# Patient Record
Sex: Female | Born: 1940
Health system: Southern US, Community
[De-identification: ages and names within clinical notes are randomized; demographics above are authoritative.]

## PROBLEM LIST (undated history)

## (undated) ENCOUNTER — Emergency Department (HOSPITAL_COMMUNITY): Payer: Medicare HMO

## (undated) DIAGNOSIS — I2510 Atherosclerotic heart disease of native coronary artery without angina pectoris: Secondary | ICD-10-CM

## (undated) DIAGNOSIS — I472 Ventricular tachycardia: Secondary | ICD-10-CM

## (undated) DIAGNOSIS — F419 Anxiety disorder, unspecified: Secondary | ICD-10-CM

## (undated) DIAGNOSIS — I059 Rheumatic mitral valve disease, unspecified: Secondary | ICD-10-CM

## (undated) DIAGNOSIS — I219 Acute myocardial infarction, unspecified: Secondary | ICD-10-CM

## (undated) DIAGNOSIS — I4729 Other ventricular tachycardia: Secondary | ICD-10-CM

## (undated) DIAGNOSIS — I340 Nonrheumatic mitral (valve) insufficiency: Secondary | ICD-10-CM

## (undated) DIAGNOSIS — E7800 Pure hypercholesterolemia, unspecified: Secondary | ICD-10-CM

## (undated) DIAGNOSIS — K279 Peptic ulcer, site unspecified, unspecified as acute or chronic, without hemorrhage or perforation: Secondary | ICD-10-CM

## (undated) DIAGNOSIS — I5032 Chronic diastolic (congestive) heart failure: Secondary | ICD-10-CM

## (undated) DIAGNOSIS — M1990 Unspecified osteoarthritis, unspecified site: Secondary | ICD-10-CM

## (undated) DIAGNOSIS — K5520 Angiodysplasia of colon without hemorrhage: Secondary | ICD-10-CM

## (undated) DIAGNOSIS — C189 Malignant neoplasm of colon, unspecified: Secondary | ICD-10-CM

## (undated) DIAGNOSIS — I480 Paroxysmal atrial fibrillation: Secondary | ICD-10-CM

## (undated) DIAGNOSIS — J9621 Acute and chronic respiratory failure with hypoxia: Secondary | ICD-10-CM

## (undated) DIAGNOSIS — D509 Iron deficiency anemia, unspecified: Secondary | ICD-10-CM

## (undated) DIAGNOSIS — J189 Pneumonia, unspecified organism: Secondary | ICD-10-CM

## (undated) DIAGNOSIS — K219 Gastro-esophageal reflux disease without esophagitis: Secondary | ICD-10-CM

## (undated) DIAGNOSIS — Z8719 Personal history of other diseases of the digestive system: Secondary | ICD-10-CM

## (undated) DIAGNOSIS — Z992 Dependence on renal dialysis: Secondary | ICD-10-CM

## (undated) DIAGNOSIS — C569 Malignant neoplasm of unspecified ovary: Secondary | ICD-10-CM

## (undated) DIAGNOSIS — I10 Essential (primary) hypertension: Secondary | ICD-10-CM

## (undated) DIAGNOSIS — I739 Peripheral vascular disease, unspecified: Secondary | ICD-10-CM

## (undated) DIAGNOSIS — I779 Disorder of arteries and arterioles, unspecified: Secondary | ICD-10-CM

## (undated) DIAGNOSIS — J42 Unspecified chronic bronchitis: Secondary | ICD-10-CM

## (undated) DIAGNOSIS — N186 End stage renal disease: Secondary | ICD-10-CM

## (undated) DIAGNOSIS — I079 Rheumatic tricuspid valve disease, unspecified: Secondary | ICD-10-CM

## (undated) DIAGNOSIS — K222 Esophageal obstruction: Secondary | ICD-10-CM

## (undated) DIAGNOSIS — I5022 Chronic systolic (congestive) heart failure: Secondary | ICD-10-CM

## (undated) DIAGNOSIS — I82C11 Acute embolism and thrombosis of right internal jugular vein: Secondary | ICD-10-CM

## (undated) DIAGNOSIS — G459 Transient cerebral ischemic attack, unspecified: Secondary | ICD-10-CM

## (undated) DIAGNOSIS — D649 Anemia, unspecified: Secondary | ICD-10-CM

## (undated) DIAGNOSIS — Z8739 Personal history of other diseases of the musculoskeletal system and connective tissue: Secondary | ICD-10-CM

## (undated) DIAGNOSIS — Z9289 Personal history of other medical treatment: Secondary | ICD-10-CM

## (undated) HISTORY — DX: Other ventricular tachycardia: I47.29

## (undated) HISTORY — PX: COLON SURGERY: SHX602

## (undated) HISTORY — PX: DILATION AND CURETTAGE OF UTERUS: SHX78

## (undated) HISTORY — PX: TUBAL LIGATION: SHX77

## (undated) HISTORY — DX: Nonrheumatic mitral (valve) insufficiency: I34.0

## (undated) HISTORY — DX: Rheumatic mitral valve disease, unspecified: I05.9

## (undated) HISTORY — DX: Ventricular tachycardia: I47.2

## (undated) HISTORY — DX: Esophageal obstruction: K22.2

## (undated) HISTORY — DX: Paroxysmal atrial fibrillation: I48.0

## (undated) HISTORY — DX: Angiodysplasia of colon without hemorrhage: K55.20

## (undated) HISTORY — PX: OVARY SURGERY: SHX727

## (undated) HISTORY — DX: Anemia, unspecified: D64.9

## (undated) HISTORY — DX: Peripheral vascular disease, unspecified: I73.9

## (undated) HISTORY — PX: CORONARY ANGIOPLASTY WITH STENT PLACEMENT: SHX49

## (undated) HISTORY — DX: Rheumatic tricuspid valve disease, unspecified: I07.9

## (undated) HISTORY — DX: Disorder of arteries and arterioles, unspecified: I77.9

## (undated) HISTORY — DX: Chronic systolic (congestive) heart failure: I50.22

---

## 1990-06-10 HISTORY — PX: APPENDECTOMY: SHX54

## 1990-07-11 DIAGNOSIS — C189 Malignant neoplasm of colon, unspecified: Secondary | ICD-10-CM

## 1990-07-11 DIAGNOSIS — C569 Malignant neoplasm of unspecified ovary: Secondary | ICD-10-CM

## 1990-07-11 HISTORY — PX: ABDOMINAL HYSTERECTOMY: SHX81

## 1990-07-11 HISTORY — DX: Malignant neoplasm of unspecified ovary: C56.9

## 1990-07-11 HISTORY — DX: Malignant neoplasm of colon, unspecified: C18.9

## 1990-07-11 HISTORY — PX: COLON RESECTION: SHX5231

## 2005-12-19 ENCOUNTER — Ambulatory Visit: Payer: Self-pay | Admitting: Cardiology

## 2006-07-12 ENCOUNTER — Ambulatory Visit: Payer: Self-pay | Admitting: Cardiology

## 2009-05-30 ENCOUNTER — Ambulatory Visit: Payer: Self-pay | Admitting: Cardiology

## 2009-07-11 HISTORY — PX: THROMBECTOMY / ARTERIOVENOUS GRAFT REVISION: SUR1351

## 2009-07-11 HISTORY — PX: AV FISTULA PLACEMENT: SHX1204

## 2011-03-28 DIAGNOSIS — R079 Chest pain, unspecified: Secondary | ICD-10-CM

## 2011-03-29 DIAGNOSIS — R079 Chest pain, unspecified: Secondary | ICD-10-CM

## 2011-04-29 DIAGNOSIS — N186 End stage renal disease: Secondary | ICD-10-CM

## 2011-04-29 DIAGNOSIS — I120 Hypertensive chronic kidney disease with stage 5 chronic kidney disease or end stage renal disease: Secondary | ICD-10-CM | POA: Insufficient documentation

## 2011-04-29 DIAGNOSIS — D631 Anemia in chronic kidney disease: Secondary | ICD-10-CM | POA: Diagnosis present

## 2011-04-29 DIAGNOSIS — N2581 Secondary hyperparathyroidism of renal origin: Secondary | ICD-10-CM | POA: Diagnosis present

## 2011-04-29 DIAGNOSIS — M109 Gout, unspecified: Secondary | ICD-10-CM | POA: Insufficient documentation

## 2011-05-04 DIAGNOSIS — F419 Anxiety disorder, unspecified: Secondary | ICD-10-CM | POA: Insufficient documentation

## 2011-12-10 DIAGNOSIS — E7800 Pure hypercholesterolemia, unspecified: Secondary | ICD-10-CM

## 2011-12-10 HISTORY — DX: Pure hypercholesterolemia, unspecified: E78.00

## 2011-12-12 DIAGNOSIS — R079 Chest pain, unspecified: Secondary | ICD-10-CM

## 2011-12-15 ENCOUNTER — Ambulatory Visit (HOSPITAL_COMMUNITY): Admit: 2011-12-15 | Payer: Self-pay | Admitting: Cardiovascular Disease

## 2011-12-15 ENCOUNTER — Inpatient Hospital Stay (HOSPITAL_COMMUNITY)
Admission: AD | Admit: 2011-12-15 | Discharge: 2011-12-16 | DRG: 246 | Disposition: A | Discharge status: Home / Self Care | Payer: Medicare Other | Source: Other Acute Inpatient Hospital | Attending: Cardiology | Admitting: Cardiology

## 2011-12-15 ENCOUNTER — Encounter (HOSPITAL_COMMUNITY)
Admission: AD | Disposition: A | Discharge status: Home / Self Care | Payer: Self-pay | Source: Other Acute Inpatient Hospital | Attending: Cardiology

## 2011-12-15 ENCOUNTER — Other Ambulatory Visit: Payer: Self-pay | Admitting: Physician Assistant

## 2011-12-15 ENCOUNTER — Encounter (HOSPITAL_COMMUNITY): Payer: Self-pay | Admitting: General Practice

## 2011-12-15 ENCOUNTER — Other Ambulatory Visit: Payer: Self-pay | Admitting: Cardiology

## 2011-12-15 DIAGNOSIS — Z992 Dependence on renal dialysis: Secondary | ICD-10-CM

## 2011-12-15 DIAGNOSIS — M109 Gout, unspecified: Secondary | ICD-10-CM | POA: Diagnosis present

## 2011-12-15 DIAGNOSIS — Z85038 Personal history of other malignant neoplasm of large intestine: Secondary | ICD-10-CM

## 2011-12-15 DIAGNOSIS — K219 Gastro-esophageal reflux disease without esophagitis: Secondary | ICD-10-CM | POA: Diagnosis present

## 2011-12-15 DIAGNOSIS — N039 Chronic nephritic syndrome with unspecified morphologic changes: Secondary | ICD-10-CM | POA: Diagnosis present

## 2011-12-15 DIAGNOSIS — I2510 Atherosclerotic heart disease of native coronary artery without angina pectoris: Principal | ICD-10-CM

## 2011-12-15 DIAGNOSIS — Z95820 Peripheral vascular angioplasty status with implants and grafts: Secondary | ICD-10-CM

## 2011-12-15 DIAGNOSIS — Z91041 Radiographic dye allergy status: Secondary | ICD-10-CM

## 2011-12-15 DIAGNOSIS — I10 Essential (primary) hypertension: Secondary | ICD-10-CM

## 2011-12-15 DIAGNOSIS — N186 End stage renal disease: Secondary | ICD-10-CM | POA: Diagnosis present

## 2011-12-15 DIAGNOSIS — I120 Hypertensive chronic kidney disease with stage 5 chronic kidney disease or end stage renal disease: Secondary | ICD-10-CM | POA: Diagnosis present

## 2011-12-15 DIAGNOSIS — Z881 Allergy status to other antibiotic agents status: Secondary | ICD-10-CM

## 2011-12-15 DIAGNOSIS — D631 Anemia in chronic kidney disease: Secondary | ICD-10-CM | POA: Diagnosis present

## 2011-12-15 DIAGNOSIS — Z79899 Other long term (current) drug therapy: Secondary | ICD-10-CM

## 2011-12-15 DIAGNOSIS — R079 Chest pain, unspecified: Secondary | ICD-10-CM

## 2011-12-15 DIAGNOSIS — Z8543 Personal history of malignant neoplasm of ovary: Secondary | ICD-10-CM

## 2011-12-15 DIAGNOSIS — E785 Hyperlipidemia, unspecified: Secondary | ICD-10-CM

## 2011-12-15 DIAGNOSIS — M19079 Primary osteoarthritis, unspecified ankle and foot: Secondary | ICD-10-CM | POA: Diagnosis present

## 2011-12-15 DIAGNOSIS — M19049 Primary osteoarthritis, unspecified hand: Secondary | ICD-10-CM | POA: Diagnosis present

## 2011-12-15 DIAGNOSIS — J40 Bronchitis, not specified as acute or chronic: Secondary | ICD-10-CM

## 2011-12-15 DIAGNOSIS — J42 Unspecified chronic bronchitis: Secondary | ICD-10-CM | POA: Diagnosis present

## 2011-12-15 DIAGNOSIS — Z886 Allergy status to analgesic agent status: Secondary | ICD-10-CM

## 2011-12-15 DIAGNOSIS — Z880 Allergy status to penicillin: Secondary | ICD-10-CM

## 2011-12-15 DIAGNOSIS — Z7902 Long term (current) use of antithrombotics/antiplatelets: Secondary | ICD-10-CM

## 2011-12-15 DIAGNOSIS — Z7982 Long term (current) use of aspirin: Secondary | ICD-10-CM

## 2011-12-15 DIAGNOSIS — F411 Generalized anxiety disorder: Secondary | ICD-10-CM | POA: Diagnosis present

## 2011-12-15 DIAGNOSIS — Z882 Allergy status to sulfonamides status: Secondary | ICD-10-CM

## 2011-12-15 DIAGNOSIS — I200 Unstable angina: Secondary | ICD-10-CM

## 2011-12-15 HISTORY — DX: Pneumonia, unspecified organism: J18.9

## 2011-12-15 HISTORY — DX: Atherosclerotic heart disease of native coronary artery without angina pectoris: I25.10

## 2011-12-15 HISTORY — DX: Gastro-esophageal reflux disease without esophagitis: K21.9

## 2011-12-15 HISTORY — DX: Essential (primary) hypertension: I10

## 2011-12-15 HISTORY — DX: Unspecified osteoarthritis, unspecified site: M19.90

## 2011-12-15 HISTORY — PX: LEFT HEART CATHETERIZATION WITH CORONARY ANGIOGRAM: SHX5451

## 2011-12-15 HISTORY — DX: Personal history of other diseases of the digestive system: Z87.19

## 2011-12-15 HISTORY — DX: Malignant neoplasm of unspecified ovary: C56.9

## 2011-12-15 HISTORY — PX: CORONARY ANGIOPLASTY WITH STENT PLACEMENT: SHX49

## 2011-12-15 HISTORY — DX: Personal history of other diseases of the musculoskeletal system and connective tissue: Z87.39

## 2011-12-15 HISTORY — DX: Personal history of other medical treatment: Z92.89

## 2011-12-15 HISTORY — DX: Anxiety disorder, unspecified: F41.9

## 2011-12-15 HISTORY — DX: Malignant neoplasm of colon, unspecified: C18.9

## 2011-12-15 HISTORY — DX: Unspecified chronic bronchitis: J42

## 2011-12-15 LAB — CBC
HCT: 36.8 % (ref 36.0–46.0)
MCHC: 31.8 g/dL (ref 30.0–36.0)
MCV: 93.4 fL (ref 78.0–100.0)
RDW: 17.1 % — ABNORMAL HIGH (ref 11.5–15.5)

## 2011-12-15 LAB — CREATININE, SERUM: GFR calc Af Amer: 6 mL/min — ABNORMAL LOW (ref >90)

## 2011-12-15 LAB — POCT ACTIVATED CLOTTING TIME: Activated Clotting Time: 325 seconds

## 2011-12-15 SURGERY — LEFT HEART CATHETERIZATION WITH CORONARY ANGIOGRAM
Anesthesia: LOCAL

## 2011-12-15 MED ORDER — NITROGLYCERIN 0.4 MG SL SUBL: 0.4000 mg | SUBLINGUAL_TABLET | SUBLINGUAL | Status: DC | PRN

## 2011-12-15 MED ORDER — MIDAZOLAM HCL 2 MG/2ML IJ SOLN
INTRAMUSCULAR | Status: AC
  Filled 2011-12-15: qty 2

## 2011-12-15 MED ORDER — NITROGLYCERIN 0.2 MG/ML ON CALL CATH LAB
INTRAVENOUS | Status: AC
  Filled 2011-12-15: qty 1

## 2011-12-15 MED ORDER — BIVALIRUDIN 250 MG IV SOLR
INTRAVENOUS | Status: AC
  Filled 2011-12-15: qty 250

## 2011-12-15 MED ORDER — ASPIRIN 81 MG PO CHEW: 324.0000 mg | CHEWABLE_TABLET | ORAL | Status: DC

## 2011-12-15 MED ORDER — LANTHANUM CARBONATE 500 MG PO CHEW
1000.0000 mg | CHEWABLE_TABLET | Freq: Three times a day (TID) | ORAL | Status: DC
  Administered 2011-12-16 (×3): 1000 mg via ORAL
  Filled 2011-12-15 (×7): qty 2

## 2011-12-15 MED ORDER — SODIUM CHLORIDE 0.9 % IV SOLN: 250.0000 mL | INTRAVENOUS | Status: DC | PRN

## 2011-12-15 MED ORDER — HYDRALAZINE HCL 10 MG PO TABS
10.0000 mg | ORAL_TABLET | Freq: Four times a day (QID) | ORAL | Status: DC | PRN
  Filled 2011-12-15: qty 1

## 2011-12-15 MED ORDER — FAMOTIDINE 20 MG PO TABS
20.0000 mg | ORAL_TABLET | Freq: Once | ORAL | Status: DC
  Filled 2011-12-15: qty 1

## 2011-12-15 MED ORDER — ENOXAPARIN SODIUM 30 MG/0.3ML ~~LOC~~ SOLN
30.0000 mg | SUBCUTANEOUS | Status: DC
  Filled 2011-12-15: qty 0.3

## 2011-12-15 MED ORDER — ONDANSETRON HCL 4 MG/2ML IJ SOLN: 4.0000 mg | Freq: Four times a day (QID) | INTRAMUSCULAR | Status: DC | PRN

## 2011-12-15 MED ORDER — CLONIDINE HCL ER 0.1 MG PO TB12
0.2000 mg | ORAL_TABLET | Freq: Two times a day (BID) | ORAL | Status: DC
  Filled 2011-12-15 (×4): qty 2

## 2011-12-15 MED ORDER — ALPRAZOLAM 0.25 MG PO TABS
0.2500 mg | ORAL_TABLET | Freq: Every day | ORAL | Status: DC
  Administered 2011-12-15: 0.25 mg via ORAL
  Filled 2011-12-15 (×2): qty 1

## 2011-12-15 MED ORDER — NITROGLYCERIN 2 % TD OINT
1.0000 [in_us] | TOPICAL_OINTMENT | Freq: Four times a day (QID) | TRANSDERMAL | Status: DC
  Filled 2011-12-15: qty 30

## 2011-12-15 MED ORDER — HEPARIN (PORCINE) IN NACL 2-0.9 UNIT/ML-% IJ SOLN
INTRAMUSCULAR | Status: AC
  Filled 2011-12-15: qty 2000

## 2011-12-15 MED ORDER — CEFUROXIME AXETIL 250 MG PO TABS
250.0000 mg | ORAL_TABLET | Freq: Two times a day (BID) | ORAL | Status: DC
  Administered 2011-12-16 (×2): 250 mg via ORAL
  Filled 2011-12-15 (×3): qty 1

## 2011-12-15 MED ORDER — ACETAMINOPHEN 325 MG PO TABS: 650.0000 mg | ORAL_TABLET | ORAL | Status: DC | PRN

## 2011-12-15 MED ORDER — DIPHENHYDRAMINE HCL 25 MG PO CAPS: 25.0000 mg | ORAL_CAPSULE | Freq: Once | ORAL | Status: DC

## 2011-12-15 MED ORDER — PANTOPRAZOLE SODIUM 40 MG PO TBEC: 40.0000 mg | DELAYED_RELEASE_TABLET | Freq: Every day | ORAL | Status: DC

## 2011-12-15 MED ORDER — ATORVASTATIN CALCIUM 20 MG PO TABS
20.0000 mg | ORAL_TABLET | Freq: Every day | ORAL | Status: DC
  Administered 2011-12-15: 20 mg via ORAL
  Filled 2011-12-15 (×3): qty 1

## 2011-12-15 MED ORDER — SODIUM CHLORIDE 0.9 % IJ SOLN
3.0000 mL | Freq: Two times a day (BID) | INTRAMUSCULAR | Status: DC
  Administered 2011-12-15: 3 mL via INTRAVENOUS

## 2011-12-15 MED ORDER — DIAZEPAM 5 MG PO TABS: 5.0000 mg | ORAL_TABLET | ORAL | Status: DC

## 2011-12-15 MED ORDER — ASPIRIN EC 81 MG PO TBEC
81.0000 mg | DELAYED_RELEASE_TABLET | Freq: Every day | ORAL | Status: DC
  Administered 2011-12-16: 81 mg via ORAL
  Filled 2011-12-15: qty 1

## 2011-12-15 MED ORDER — LIDOCAINE HCL (PF) 1 % IJ SOLN
INTRAMUSCULAR | Status: AC
  Filled 2011-12-15: qty 30

## 2011-12-15 MED ORDER — SODIUM CHLORIDE 0.9 % IJ SOLN: 3.0000 mL | INTRAMUSCULAR | Status: DC | PRN

## 2011-12-15 MED ORDER — DIAZEPAM 5 MG PO TABS
ORAL_TABLET | ORAL | Status: AC
  Filled 2011-12-15: qty 1

## 2011-12-15 MED ORDER — CLOPIDOGREL BISULFATE 75 MG PO TABS
ORAL_TABLET | ORAL | Status: AC
  Filled 2011-12-15: qty 6

## 2011-12-15 MED ORDER — SODIUM CHLORIDE 0.9 % IJ SOLN: 3.0000 mL | Freq: Two times a day (BID) | INTRAMUSCULAR | Status: DC

## 2011-12-15 MED ORDER — FENTANYL CITRATE 0.05 MG/ML IJ SOLN
INTRAMUSCULAR | Status: AC
  Filled 2011-12-15: qty 2

## 2011-12-15 MED ORDER — METHYLPREDNISOLONE SODIUM SUCC 125 MG IJ SOLR: 125.0000 mg | Freq: Every day | INTRAMUSCULAR | Status: DC

## 2011-12-15 MED ORDER — METHYLPREDNISOLONE SODIUM SUCC 125 MG IJ SOLR
INTRAMUSCULAR | Status: AC
  Filled 2011-12-15: qty 2

## 2011-12-15 MED ORDER — ASPIRIN 81 MG PO CHEW
CHEWABLE_TABLET | ORAL | Status: AC
  Filled 2011-12-15: qty 4

## 2011-12-15 MED ORDER — ASPIRIN 300 MG RE SUPP: 300.0000 mg | RECTAL | Status: DC

## 2011-12-15 MED ORDER — PREDNISONE 50 MG PO TABS: 60.0000 mg | ORAL_TABLET | Freq: Once | ORAL | Status: DC

## 2011-12-15 MED ORDER — AMLODIPINE BESYLATE 10 MG PO TABS
10.0000 mg | ORAL_TABLET | Freq: Every day | ORAL | Status: DC
  Administered 2011-12-16: 10 mg via ORAL
  Filled 2011-12-15 (×2): qty 1

## 2011-12-15 MED ORDER — CLOPIDOGREL BISULFATE 75 MG PO TABS
75.0000 mg | ORAL_TABLET | Freq: Every day | ORAL | Status: DC
  Administered 2011-12-16: 75 mg via ORAL
  Filled 2011-12-15: qty 1

## 2011-12-15 MED ORDER — LABETALOL HCL 200 MG PO TABS
400.0000 mg | ORAL_TABLET | Freq: Two times a day (BID) | ORAL | Status: DC
  Administered 2011-12-15 – 2011-12-16 (×2): 400 mg via ORAL
  Filled 2011-12-15 (×4): qty 2

## 2011-12-15 NOTE — Progress Notes (Signed)
UR Completed Hien Perreira Graves-Bigelow, RN,BSN [phone]  

## 2011-12-15 NOTE — Interval H&P Note (Signed)
History and Physical Interval Note:  12/15/2011 3:17 PM  Shawna [name]  has presented today for surgery, with the diagnosis of Chest pain  The various methods of treatment have been discussed with the patient and family. After consideration of risks, benefits and other options for treatment, the patient has consented to  Procedure(s) (LRB): LEFT HEART CATHETERIZATION WITH CORONARY ANGIOGRAM (N/A) as a surgical intervention .  The patients' history has been reviewed, patient examined, no change in status, stable for surgery.  I have reviewed the patients' chart and labs.  Questions were answered to the patient's satisfaction.     Torrell Krutz

## 2011-12-15 NOTE — Care Management Note (Unsigned)
    Page 1 of 1   12/15/2011     4:02:02 PM   CARE MANAGEMENT NOTE 12/15/2011  Patient:  Shawna [name], Shawna [name]   Account Number:  [id number]  Date Initiated:  12/15/2011  Documentation initiated by:  GRAVES-BIGELOW,Carlene Bickley  Subjective/Objective Assessment:   Pt admitted with + stress test and planned for cath.     Action/Plan:   CM will continue to monitor post cath for disposiiton needs.   Anticipated DC Date:  12/17/2011   Anticipated DC Plan:  [place]  CM consult      Choice offered to / List presented to:             Status of service:  In process, will continue to follow Medicare Important Message given?   (If response is "NO", the following Medicare IM given date fields will be blank) Date Medicare IM given:   Date Additional Medicare IM given:    Discharge Disposition:    Per UR Regulation:  Reviewed for med. necessity/level of care/duration of stay  If discussed at [place] of Stay Meetings, dates discussed:    Comments:

## 2011-12-15 NOTE — CV Procedure (Signed)
Cardiac Catheterization Operative Report  Shylin Venezia [id number] 6/6/20133:40 PM [name], MD, MD  Procedure Performed:  1. Left Heart Catheterization 2. Selective Coronary Angiography 3. Left ventricular angiogram 4. PTCA/DES x 1 proximal Circumflex 5. PTCA/DES x 1 Diagonal 1  Operator: Lauree Chandler, MD  Indication: Pt with history of HTN, ESRD on HD who has had recent chest pain. Stress echo with possible ischemia. Pt transferred here today by Dr. Dannielle Burn for cardiac cath. She reports itching in past with IV dye used for MRI and "test on kidneys". She has received 60 mg po Prednisone this am and 125 mg IV Solumedrol this afternoon. Pt given 324 mg ASA on the cath table before the case started.                                    Procedure Details: The risks, benefits, complications, treatment options, and expected outcomes were discussed with the patient. The patient and/or family concurred with the proposed plan, giving informed consent. The patient was brought to the cath lab after IV hydration was begun and oral premedication was given. The patient was further sedated with Versed and Fentanyl. The right groin was prepped and draped in the usual manner. Using the modified Seldinger access technique, a 5 French sheath was placed in the right femoral artery. Standard diagnostic catheters were used to perform selective coronary angiography. A pigtail catheter was used to perform a left ventricular angiogram.  She was found to have a severe stenosis in the Circumflex and a severe stenosis in the Diagonal branch. I elected to proceed to intervention of both vessels. She was given a bolus of Angiomax and a drip was started. When the ACT was >200, I engaged the left main with a XB LAD 3.5 guiding catheter. I had considerable difficulty passing a wire down the AV groove Circumflex. This was complicated by the acute takeoff and also a large intermediate branch that the wire  favored. I was ultimately able to pass a Whisper wire down the Circumflex. I then used a 2.0 x 12 mm balloon to predilate the stenosis. I then deployed a 2.25 x 14 mm Resolute Integrity DES in the proximal Circumflex. This was post-dilated with a 2.25 x 8 mm Prospect balloon x 2. There was an excellent result. The stenosis was taken from 90% down to 0%.  I then turned my atttention to the Diagonal lesion. A Cougar wire was passed down the LAD into the Diagonal branch. I then used a 2.0 x 12 mm balloon to pre-dilate the stenosis in the Diagonal. A 2.25 x 22 mm Resolute Integrity DES was deployed in the Diagonal branch. I then post-dilated the stent with a 2.25 x 12 mm balloon in the distal segment. I then used a 2.25 x 6 mm [place] balloon to post-dilate the proximal and mid segments of the stent. There was an excellent angiographic result. The stenosis was taken from 99% down to 0%.   There were no immediate complications. She was given 600 mg Plavix po x 1 at the conclusion of the case since she could not adequately swallow the pills while lying flat on the table. The patient was taken to the recovery area in stable condition.   Hemodynamic Findings: Central aortic pressure: 159/75 Left ventricular pressure: 171/915  Angiographic Findings:  Left main:  Mild plaque.   Left Anterior Descending Artery: Large caliber vessel that  courses to the apex. The proximal vessel has diffuse 30% stenosis. The mid vessel has a 40% stenosis just after the moderate sized first diagonal branch. The diagonal branch is a small to moderate sized vessel (2.42mm) with 99% stenosis in the mid portion. The distal LAD has a smooth 50% stenosis.   Circumflex Artery: Small to moderate sized vessel with 90% stenosis proximally just after a small to moderate sized OM branch. There is a moderate sized intermediate branch with proximal 20% stenosis.   Right Coronary Artery: Large dominant vessel with mild diffuse plaque throughout.   Left  Ventricular Angiogram: LVEF 55%. No significant MR noted.   Impression: 1. Triple vessel CAD with severe stenosis in the Circumflex and Diagonal branches with moderate LAD disease.  2. Successful PTCA/DES x 1 in the Circumflex artery 3. Successful PTCA/DES x 1 in the Diagonal artery 4. Preserved LV systolic function  Recommendations: She will need ASA and Plavix for at least one year. She reports issues with ASA which do not sound like a true allergy. Will discuss further in am. Will start a statin.        Complications:  None. The patient tolerated the procedure well.

## 2011-12-15 NOTE — H&P (View-Only) (Signed)
NAME:  Shawna [name], Shawna [name] NIBLETT ROOM: [place]:  [id number] LOCATION: 49F 304 01 ADM/VISIT DATE:  12/11/2011   ADM PHYSCaryl Bis:  [id number] DOB: 11/05/1940   SUBJECTIVE:  The patient is doing well.  She denies any chest pain or shortness of breath.  Her coughing has improved also.  She had her stress test yesterday, which had to be redone because she initially could not achieve her 85% of maximum predicted heart rate.  OBJECTIVE:  Vital signs:  Blood pressure 152/80, heart rate 73 beats per minute, respirations 16, temperature is 98, saturation 96% on room air.  General:  A well-nourished and very pleasant African American female in no distress.  HEENT:  Within normal limits.  Neck:  Normal carotid upstroke.  No carotid bruits.  Lungs: Clear breath sounds bilaterally.  Heart:  Regular rate and rhythm.  Normal S1, S2.  No pathological murmurs.  Extremities:  There is no peripheral pitting edema.  LABORATORY:  Glucose 11The right ventricle is mildly dilated. 2, BUN 37, creatinine 6.55, calcium 9, sodium 139, potassium 4.6, CO2 30.  Cardiac enzymes previously were mildly elevated at 0.08, 0.08, 0.26 and 0.03 respectively in reverse order.  IMAGING:  A stress echocardiogram:  Abnormal ST segment depression of 1 mm node, leads 2, 3, AVF and V4 through V6 at peak.  No arrhythmias.  Positive electrocardiographic stress test.  There was also positive echocardiographic stress portion with hypokinesis of the lateral wall at peak heart rate.  This study was interpreted by Dr. Domenic Polite.  ASSESSMENT: 1. Atypical chest pain. a. Borderline cardiac enzymes. b. Negative electrocardiogram for ischemia. c. Normal Lexiscan in 03/2011. d. Abnormal dobutamine stress echocardiogram with lateral hypokinesis and ST segment depression in leads 2, 3, AVF and V4 through V6. 2. Dyspnea on exertion. a. Normal left ventricular systolic function. b. Abnormal diastolic function, consistent with impaired relaxation.  The left  ventricular diastolic filling pattern is consistent with both elevated mean left atrial pressure, left ventricle and diastolic pressure. 3. Mitral regurgitation by echocardiogram, not audible on exam. 4. Hypertension, under improved control. 5. End-stage renal disease, on hemodialysis. 6. IVP dye allergy.  Dye allergy is characterized by itching and rash, but no anaphylactic reaction.  PLAN: 1. The patient has agreed to proceed with cardiac catheterization, after we explained to her the results of the stress echocardiogram.  Although her chest pain is atypical, she has multiple cardiac risk factors and she would benefit from a diagnostic cardiac catheterization. 2. The patient is IVP dye allergic, characterized by rash and itching.  No anaphylaxis, however, and no problems breathing.  Unfortunately the patient was not pretreated last night with prednisone, H2 blockers, and H1 blockers.  I have given her the medications stat this morning, and we will repeat that in 6 hours.  I do think, given that her allergy is only a rash, she can likely proceed with catheterization later today after she receives her second dose of prednisone. 3. I discussed the risks and benefits of there cardiac catheterization with the patient, and she is willing to proceed, and I will notify [facility] and we will have the patient transfer to [facility] as soon as possible.   __________________________    Terald Sleeper, M.D. Dairl Ponder D: 12/15/2011 [id number] T: 12/15/2011 0917 P: DEG  Al Decant Gent 12/15/2011 1:08 PM

## 2011-12-15 NOTE — H&P (Signed)
NAME:  Shawna [name], Shawna [name] ROOM: [place]:  [id number] LOCATION: 62F 304 01 ADM/VISIT DATE:  12/11/2011   ADM PHYSCaryl Bis:  [id number] DOB: [birth year]   SUBJECTIVE:  The patient is doing well.  She denies any chest pain or shortness of breath.  Her coughing has improved also.  She had her stress test yesterday, which had to be redone because she initially could not achieve her 85% of maximum predicted heart rate.  OBJECTIVE:  Vital signs:  Blood pressure 152/80, heart rate 73 beats per minute, respirations 16, temperature is 98, saturation 96% on room air.  General:  A well-nourished and very pleasant African American female in no distress.  HEENT:  Within normal limits.  Neck:  Normal carotid upstroke.  No carotid bruits.  Lungs: Clear breath sounds bilaterally.  Heart:  Regular rate and rhythm.  Normal S1, S2.  No pathological murmurs.  Extremities:  There is no peripheral pitting edema.  LABORATORY:  Glucose 11The right ventricle is mildly dilated. 2, BUN 37, creatinine 6.55, calcium 9, sodium 139, potassium 4.6, CO2 30.  Cardiac enzymes previously were mildly elevated at 0.08, 0.08, 0.26 and 0.03 respectively in reverse order.  IMAGING:  A stress echocardiogram:  Abnormal ST segment depression of 1 mm node, leads 2, 3, AVF and V4 through V6 at peak.  No arrhythmias.  Positive electrocardiographic stress test.  There was also positive echocardiographic stress portion with hypokinesis of the lateral wall at peak heart rate.  This study was interpreted by Dr. Domenic Polite.  ASSESSMENT: 1. Atypical chest pain. a. Borderline cardiac enzymes. b. Negative electrocardiogram for ischemia. c. Normal Lexiscan in 03/2011. d. Abnormal dobutamine stress echocardiogram with lateral hypokinesis and ST segment depression in leads 2, 3, AVF and V4 through V6. 2. Dyspnea on exertion. a. Normal left ventricular systolic function. b. Abnormal diastolic function, consistent with impaired relaxation.  The left  ventricular diastolic filling pattern is consistent with both elevated mean left atrial pressure, left ventricle and diastolic pressure. 3. Mitral regurgitation by echocardiogram, not audible on exam. 4. Hypertension, under improved control. 5. End-stage renal disease, on hemodialysis. 6. IVP dye allergy.  Dye allergy is characterized by itching and rash, but no anaphylactic reaction.  PLAN: 1. The patient has agreed to proceed with cardiac catheterization, after we explained to her the results of the stress echocardiogram.  Although her chest pain is atypical, she has multiple cardiac risk factors and she would benefit from a diagnostic cardiac catheterization. 2. The patient is IVP dye allergic, characterized by rash and itching.  No anaphylaxis, however, and no problems breathing.  Unfortunately the patient was not pretreated last night with prednisone, H2 blockers, and H1 blockers.  I have given her the medications stat this morning, and we will repeat that in 6 hours.  I do think, given that her allergy is only a rash, she can likely proceed with catheterization later today after she receives her second dose of prednisone. 3. I discussed the risks and benefits of there cardiac catheterization with the patient, and she is willing to proceed, and I will notify [facility] and we will have the patient transfer to [facility] as soon as possible.   __________________________    Terald Sleeper, M.D. Dairl Ponder D: 12/15/2011 [id number] T: 12/15/2011 0917 P: DEG  Al Decant Gent 12/15/2011 1:08 PM

## 2011-12-16 ENCOUNTER — Inpatient Hospital Stay (HOSPITAL_COMMUNITY): Payer: Medicare Other

## 2011-12-16 ENCOUNTER — Encounter (HOSPITAL_COMMUNITY): Payer: Self-pay | Admitting: Nurse Practitioner

## 2011-12-16 DIAGNOSIS — J40 Bronchitis, not specified as acute or chronic: Secondary | ICD-10-CM

## 2011-12-16 DIAGNOSIS — Z992 Dependence on renal dialysis: Secondary | ICD-10-CM

## 2011-12-16 DIAGNOSIS — N186 End stage renal disease: Secondary | ICD-10-CM

## 2011-12-16 DIAGNOSIS — I200 Unstable angina: Secondary | ICD-10-CM

## 2011-12-16 DIAGNOSIS — I2510 Atherosclerotic heart disease of native coronary artery without angina pectoris: Secondary | ICD-10-CM

## 2011-12-16 DIAGNOSIS — I10 Essential (primary) hypertension: Secondary | ICD-10-CM

## 2011-12-16 DIAGNOSIS — E785 Hyperlipidemia, unspecified: Secondary | ICD-10-CM

## 2011-12-16 LAB — BASIC METABOLIC PANEL
Calcium: 9.2 mg/dL (ref 8.4–10.5)
Creatinine, Ser: 8.88 mg/dL — ABNORMAL HIGH (ref 0.50–1.10)
GFR calc Af Amer: 5 mL/min — ABNORMAL LOW (ref >90)
GFR calc non Af Amer: 4 mL/min — ABNORMAL LOW (ref >90)

## 2011-12-16 LAB — LIPID PANEL
LDL Cholesterol: 114 mg/dL — ABNORMAL HIGH (ref 0–99)
Total CHOL/HDL Ratio: 2.5 RATIO

## 2011-12-16 LAB — CBC
Platelets: 247 10*3/uL (ref 150–400)
RDW: 17.3 % — ABNORMAL HIGH (ref 11.5–15.5)
WBC: 6.9 10*3/uL (ref 4.0–10.5)

## 2011-12-16 MED ORDER — PANTOPRAZOLE SODIUM 40 MG PO TBEC: 40.0000 mg | DELAYED_RELEASE_TABLET | Freq: Every day | ORAL | Status: DC

## 2011-12-16 MED ORDER — PARICALCITOL 5 MCG/ML IV SOLN
INTRAVENOUS | Status: AC
  Filled 2011-12-16: qty 1

## 2011-12-16 MED ORDER — NITROGLYCERIN 0.4 MG SL SUBL: 0.4000 mg | SUBLINGUAL_TABLET | SUBLINGUAL | Status: DC | PRN

## 2011-12-16 MED ORDER — DOXERCALCIFEROL 4 MCG/2ML IV SOLN
INTRAVENOUS | Status: AC
  Administered 2011-12-16: 3.5 ug via INTRAVENOUS
  Filled 2011-12-16: qty 2

## 2011-12-16 MED ORDER — LABETALOL HCL 200 MG PO TABS: 400.0000 mg | ORAL_TABLET | Freq: Two times a day (BID) | ORAL | Status: DC

## 2011-12-16 MED ORDER — ASPIRIN 81 MG PO TBEC: 81.0000 mg | DELAYED_RELEASE_TABLET | Freq: Every day | ORAL | Status: DC

## 2011-12-16 MED ORDER — CLOPIDOGREL BISULFATE 75 MG PO TABS: 75.0000 mg | ORAL_TABLET | Freq: Every day | ORAL | Status: DC

## 2011-12-16 MED ORDER — DOXERCALCIFEROL 4 MCG/2ML IV SOLN
3.5000 ug | Freq: Once | INTRAVENOUS | Status: AC
  Administered 2011-12-16: 3.5 ug via INTRAVENOUS
  Filled 2011-12-16: qty 2

## 2011-12-16 MED ORDER — ATORVASTATIN CALCIUM 20 MG PO TABS: 20.0000 mg | ORAL_TABLET | Freq: Every day | ORAL | Status: DC

## 2011-12-16 MED ORDER — DARBEPOETIN ALFA-POLYSORBATE 25 MCG/0.42ML IJ SOLN
INTRAMUSCULAR | Status: AC
  Filled 2011-12-16: qty 0.42

## 2011-12-16 MED ORDER — HEPARIN SODIUM (PORCINE) 1000 UNIT/ML DIALYSIS
20.0000 [IU]/kg | INTRAMUSCULAR | Status: DC | PRN
  Administered 2011-12-16: 1300 [IU] via INTRAVENOUS_CENTRAL
  Filled 2011-12-16: qty 2

## 2011-12-16 MED ORDER — PENTAFLUOROPROP-TETRAFLUOROETH EX AERO
INHALATION_SPRAY | CUTANEOUS | Status: AC
  Administered 2011-12-16: 1
  Filled 2011-12-16: qty 103.5

## 2011-12-16 MED ORDER — DARBEPOETIN ALFA-POLYSORBATE 25 MCG/0.42ML IJ SOLN
25.0000 ug | INTRAMUSCULAR | Status: DC
  Administered 2011-12-16: 25 ug via INTRAVENOUS
  Filled 2011-12-16: qty 0.42

## 2011-12-16 MED FILL — Dextrose Inj 5%: INTRAVENOUS | Qty: 50 | Status: AC

## 2011-12-16 NOTE — Discharge Instructions (Signed)
***PLEASE REMEMBER TO BRING ALL OF YOUR MEDICATIONS TO EACH OF YOUR FOLLOW-UP OFFICE VISITS.  Groin Site Care Refer to this sheet in the next few weeks. These instructions provide you with information on caring for yourself after your procedure. Your caregiver may also give you more specific instructions. Your treatment has been planned according to current medical practices, but problems sometimes occur. Call your caregiver if you have any problems or questions after your procedure. HOME CARE INSTRUCTIONS  You may shower 24 hours after the procedure. Remove the bandage (dressing) and gently wash the site with plain soap and water. Gently pat the site dry.   Do not apply powder or lotion to the site.   Do not sit in a bathtub, swimming pool, or whirlpool for 5 to 7 days.   No bending, squatting, or lifting anything over 10 pounds (4.5 kg) as directed by your caregiver.   Inspect the site at least twice daily.   Do not drive home if you are discharged the same day of the procedure. Have someone else drive you.   You may drive 24 hours after the procedure unless otherwise instructed by your caregiver.  What to expect:  Any bruising will usually fade within 1 to 2 weeks.   Blood that collects in the tissue (hematoma) may be painful to the touch. It should usually decrease in size and tenderness within 1 to 2 weeks.  SEEK IMMEDIATE MEDICAL CARE IF:  You have unusual pain at the groin site or down the affected leg.   You have redness, warmth, swelling, or pain at the groin site.   You have drainage (other than a small amount of blood on the dressing).   You have chills.   You have a fever or persistent symptoms for more than 72 hours.   You have a fever and your symptoms suddenly get worse.   Your leg becomes pale, cool, tingly, or numb.  You have heavy bleeding from the site. Hold pressure on the site. . Coronary Angiography with Stent This is a procedure to widen or open a  narrow blood vessel of the heart (coronary artery). When a coronary artery becomes partially blocked it decreases blood flow to that area. This may lead to chest pain or a heart attack (myocardial infarction). Arteries may become blocked by cholesterol buildup (plaque) in the lining or wall. A stent is a small piece of metal that looks like a mesh or a spring. Stent placement may be done right after an angiogram that finds a blocked artery or as a treatment for a heart attack. RISKS AND COMPLICATIONS Damage to the heart.  A blockage may return.  Bleeding at the site.  Blood clot to another part of the body.  PROCEDURE You may be given a medication to help you relax before and during the procedure through an IV in your hand or arm.  A local anesthetic to make the area numb may be used before inserting the catheter (a long, hollow tube about the size of a piece of cooked spaghetti).  You will be prepared for the procedure by washing and shaving the area where the catheter will be inserted. This is usually done in the groin.  A specially trained doctor will insert the catheter with a guide wire into an artery. This is guided under a special type of X-ray (fluoroscopy) to the opening of the blocked artery.  Special dye is then injected and X-rays are taken.  A tiny wire is  guided to the blocked spot and a balloon is inflated to make the artery wider. The stent is expanded and crushes the plaque into the wall of the vessel. The stent holds the area open like a scaffolding and improves the blood flow.  Sometimes the artery may be made wider using a laser or other tools to remove plaque.  When the blood flow is better, the catheter is removed. The lining of the artery will grow over the stent which stays where it was placed.  AFTER THE PROCEDURE You will stay in bed for several hours.  The access site will be watched and you will be checked frequently.  Blood tests, X-rays and an EKG may be done.  You  may stay in the hospital overnight for observation.  SEEK IMMEDIATE MEDICAL CARE IF:  You develop chest pain, shortness of breath, feel faint, or pass out.  There is bleeding, swelling, or drainage from the catheter insertion site.  You develop pain, discoloration, coldness, or severe bruising in the leg or arm that held the catheter.  You see blood in your urine or stool. This may be bright red blood in urine or stools, or also appear as black, tarry stools.  You have a fever.  Document Released: 01/01/2003 Document Revised: 06/16/2011 Document Reviewed: 08/24/2007 [facility] Patient Information 2012 [place].

## 2011-12-16 NOTE — Discharge Summary (Signed)
See full note this am. cdm 

## 2011-12-16 NOTE — Plan of Care (Signed)
Problem: Phase II Progression Outcomes Goal: Pain controlled Outcome: Completed/Met Date Met:  12/15/11 painfree Goal: OOB to chair per PCI oders Outcome: Completed/Met Date Met:  12/15/11 OOB to bathroom and sitting up in bed. Tolerating well.  Goal: Vascular site scale level 0 - I Vascular Site Scale Level 0: No bruising/bleeding/hematoma Level I (Mild): Bruising/Ecchymosis, minimal bleeding/ooozing, palpable hematoma < 3 cm Level II (Moderate): Bleeding not affecting hemodynamic parameters, pseudoaneurysm, palpable hematoma > 3 cm Outcome: Completed/Met Date Met:  12/15/11 Right groin level 0 Goal: No post PCI angina Outcome: Completed/Met Date Met:  12/15/11 No post PCI angina reported Goal: Distal pulses equal baseline assessment Outcome: Completed/Met Date Met:  12/16/11 Pulses palpable radial and pedal bilaterally Goal: Discharge plan established Outcome: Completed/Met Date Met:  12/16/11 Planned discharge tomorrow after dialysis Goal: Tolerating diet Outcome: Completed/Met Date Met:  12/16/11 Tolerated dinner and snack tonight without problems

## 2011-12-16 NOTE — Discharge Summary (Signed)
Patient ID: Shawna [name],  MRN: [id number], DOB/AGE: [birth year] 71 y.o.  Admit date: 12/15/2011 Discharge date: 12/16/2011  Primary Care Provider: [name], MD Primary Cardiologist: Darnell Level. [name] MD  Discharge Diagnoses Principal Problem:  *Unstable angina  Active Problems:  CAD (coronary artery disease)  **s/p PCI/DES to the LCX and Diagonal this admission.  ESRD (end stage renal disease)  HTN (hypertension)  Hyperlipidemia  Bronchitis  Allergies Allergies  Allergen Reactions  . Aspirin Other (See Comments)    Mess up her stomach; "makes my bowels have blood in them"  . Contrast Media (Iodinated Diagnostic Agents) Itching  . Macrodantin (Nitrofurantoin Macrocrystal) Other (See Comments)    "broke me out in big old knots all over my body; had to go to ER"  . Penicillins Other (See Comments)    "makes me real weak when I take it; like I'll pass out"  . Bactrim (Sulfamethoxazole W-Trimethoprim) Rash  . Sulfa Antibiotics Rash   Procedures  Cardiac Catheterization and Percutaneous Coronary Intervention 12/15/2011  Hemodynamic Findings: Central aortic pressure: 159/75 Left ventricular pressure: 171/915  Angiographic Findings:  Left main:  Mild plaque.   Left Anterior Descending Artery: Large caliber vessel that courses to the apex. The proximal vessel has diffuse 30% stenosis. The mid vessel has a 40% stenosis just after the moderate sized first diagonal branch. The diagonal branch is a small to moderate sized vessel (2.65mm) with 99% stenosis in the mid portion. The distal LAD has a smooth 50% stenosis.    **The Diagonal was stented with a 2.25 x 109mm Resolute Integrity DES**  Circumflex Artery: Small to moderate sized vessel with 90% stenosis proximally just after a small to moderate sized OM branch. There is a moderate sized intermediate branch with proximal 20% stenosis.    **The LCX was stented with a 2.25 x 89mm Resolute Integrity DES** Right Coronary Artery:  Large dominant vessel with mild diffuse plaque throughout.   Left Ventricular Angiogram: LVEF 55%. No significant MR noted.  _____________  History of Present Illness  71 y/o female without prior cardiac history.  She was in her USOH until earlier this week when she developed chest pain and dyspnea prompting her to present to [facility] for further evaluation.  There, she had mild elevation in troponin (to a peak of 0.26) in the setting of known ESRD.  She was seen by cardiology and a dobutamine echo was performed and showed lateral hypokinesis with ST segment depression in leads 2, 3, aVF, & V4-V6.  Given symptoms and abnormal stress test, decision was made to transfer her to [facility] for further evaluation.  Because of a contrast allergy, she was pre-treated with routine contrast allergy prophylaxis.  Hospital Course  Following admission to [facility], pt was taken to the [facility] cath lab where she underwent diagnostic catheterization.  This revealed severe left circumflex and first diagonal disease with normal LV function.  Attention was turned to the LCX and Diagonal and both areas were successfully stented with Resolute Integrity drug-eluting stents.  She tolerated this procedure well and post-procedure has had no further chest pain.  She has been seen by nephrology and her M, W, F, dialysis schedule has been maintained.  She will be discharged home today following dialysis.  Discharge Vitals Blood pressure 177/72, pulse 75, temperature 97.8 F (36.6 C), temperature source Oral, resp. rate 18, height 5\' 1"  (1.549 m), weight 143 lb 15.4 oz (65.3 kg), SpO2 98.00%.  Filed Weights   12/15/11 1400 12/16/11 0048  12/16/11 1230  Weight: 139 lb 1.6 oz (63.095 kg) 142 lb 3.2 oz (64.5 kg) 143 lb 15.4 oz (65.3 kg)   Labs  CBC  Basename 12/16/11 0400 12/15/11 1513  WBC 6.9 5.9  NEUTROABS -- --  HGB 10.8* 11.7*  HCT 33.1* 36.8  MCV 92.7 93.4  PLT 247 [id number]   Basic Metabolic Panel  Basename  12/16/11 0400 12/15/11 1513  NA 130* --  K 5.1 --  CL 88* --  CO2 22 --  GLUCOSE 169* --  BUN 61* --  CREATININE 8.88* 7.18*  CALCIUM 9.2 --  MG -- --  PHOS -- --   Fasting Lipid Panel  Basename 12/16/11 0400  CHOL 210*  HDL 83  LDLCALC 114*  TRIG 64  CHOLHDL 2.5  LDLDIRECT --   Disposition  Pt is being discharged home today in good condition.  Follow-up Plans & Appointments  Follow-up Information    Follow up with Aurora Mask, PA on 01/05/2012. (2:00 PM)    Contact information:   [place], [place] [phone]       Follow up with [facility], MD. Jerilynn Mages, W, F dialysis)    Contact information:   [place] [phone]       Follow up with [name], MD. (as scheduled)    Contact information:   [place] [phone]        Discharge Medications  Medication List  As of 12/16/2011  3:17 PM   STOP taking these medications         guaiFENesin 600 MG 12 hr tablet      omeprazole 20 MG capsule         TAKE these medications         ALPRAZolam 0.25 MG tablet   Commonly known as: XANAX   Take 0.25-0.5 mg by mouth at bedtime.      amLODipine 10 MG tablet   Commonly known as: NORVASC   Take 10 mg by mouth daily.      aspirin 81 MG EC tablet   Take 1 tablet (81 mg total) by mouth daily.      atorvastatin 20 MG tablet   Commonly known as: LIPITOR   Take 1 tablet (20 mg total) by mouth daily at 6 PM.      cloNIDine 0.2 MG tablet   Commonly known as: CATAPRES   Take 0.2 mg by mouth 2 (two) times daily.      clopidogrel 75 MG tablet   Commonly known as: PLAVIX   Take 1 tablet (75 mg total) by mouth daily with breakfast.      folic acid-vitamin b complex-vitamin c-selenium-zinc 3 MG Tabs   Take 1 tablet by mouth daily.      iron polysaccharides 150 MG capsule   Commonly known as: NIFEREX   Take 150 mg by mouth 2 (two) times  daily.      labetalol 200 MG tablet   Commonly known as: NORMODYNE   Take 2 tablets (400 mg total) by mouth 2 (two) times daily.      lanthanum 500 MG chewable tablet   Commonly known as: FOSRENOL   Chew 1,000 mg by mouth 3 (three) times daily with meals.      loratadine 10 MG tablet   Commonly known as: CLARITIN   Take 10 mg by mouth daily as needed. For allergies.  nitroGLYCERIN 0.4 MG SL tablet   Commonly known as: NITROSTAT   Place 1 tablet (0.4 mg total) under the tongue every 5 (five) minutes x 3 doses as needed for chest pain.      pantoprazole 40 MG tablet   Commonly known as: PROTONIX   Take 1 tablet (40 mg total) by mouth daily.          Outstanding Labs/Studies  Follow-up lipids/lft's in 6-8 wks.  Duration of Discharge Encounter   Greater than 30 minutes including physician time.  Signed, Murray Hodgkins NP 12/16/2011, 3:17 PM

## 2011-12-16 NOTE — Progress Notes (Addendum)
CARDIAC REHAB PHASE I   PRE:  Rate/Rhythm: 83 SR  BP:  Supine:   Sitting: 171/65  Standing:    SaO2:   MODE:  Ambulation: 680 ft   POST:  Rate/Rhythem: 94 SR  BP:  Supine:   Sitting: 202/84 recheck 173/73  Standing:    SaO2:  [id number] Assisted X 1 to ambulate. Gait steady BP 202/84 after walk. Pt without c/o with walking, no cp or SOB. Completed discharge education with pt and husband. Pt agrees to [place]. CRP in [place], will send referral. Rechecked BP after pt rested from walk 173/73. Reported BP to RN.  Deon Pilling

## 2011-12-16 NOTE — Progress Notes (Signed)
Patient Name: Shawna [name] [name] Date of Encounter: 12/16/2011   Principal Problem:  *Unstable angina Active Problems:  CAD (coronary artery disease)  ESRD (end stage renal disease)  HTN (hypertension)  Hyperlipidemia  Bronchitis    SUBJECTIVE  No chest pain or sob overnight.  For dialysis today.  CURRENT MEDS    . ALPRAZolam  0.25 mg Oral QHS  . amLODipine  10 mg Oral Daily  . aspirin EC  81 mg Oral Daily  . atorvastatin  20 mg Oral q1800  . bivalirudin      . cefUROXime  250 mg Oral BID WC  . cloNIDine HCl  0.2 mg Oral BID  . clopidogrel  75 mg Oral Q breakfast  . diphenhydrAMINE  25 mg Oral Once  . famotidine  20 mg Oral Once  . fentaNYL      . heparin      . labetalol  400 mg Oral BID  . lanthanum  1,000 mg Oral TID WC  . lidocaine      . midazolam      . midazolam      . nitroGLYCERIN      . sodium chloride  3 mL Intravenous Q12H    OBJECTIVE  Filed Vitals:   12/15/11 2116 12/15/11 2200 12/16/11 0048 12/16/11 0413  BP: 162/75 172/66 169/87 164/71  Pulse: 82  79 82  Temp: 97.5 F (36.4 C)  97.4 F (36.3 C) 98.1 F (36.7 C)  TempSrc: Oral  Oral Oral  Resp: 20 19 20 26   Height:      Weight:   142 lb 3.2 oz (64.5 kg)   SpO2: 95%  96% 95%    Intake/Output Summary (Last 24 hours) at 12/16/11 0710 Last data filed at 12/15/11 2239  Gross per 24 hour  Intake      0 ml  Output    150 ml  Net   -150 ml   Filed Weights   12/15/11 1400 12/16/11 0048  Weight: 139 lb 1.6 oz (63.095 kg) 142 lb 3.2 oz (64.5 kg)    PHYSICAL EXAM  General: Pleasant, NAD. Neuro: Alert and oriented X 3. Moves all extremities spontaneously. Psych: Normal affect. HEENT:  Normal  Neck: Supple without bruits or JVD. Lungs:  Resp regular and unlabored, CTA. Heart: RRR no s3, s4, or murmurs. Abdomen: Soft, non-tender, non-distended, BS + x 4.  Extremities: No clubbing, cyanosis or edema. DP/PT/Radials 1+ and equal bilaterally.  R groin w/o bleeding/bruit/hematoma.   Left arm avf w/ + bruit/thrill.  Accessory Clinical Findings  CBC  Basename 12/16/11 0400 12/15/11 1513  WBC 6.9 5.9  NEUTROABS -- --  HGB 10.8* 11.7*  HCT 33.1* 36.8  MCV 92.7 93.4  PLT 247 [id number]   Basic Metabolic Panel  Basename [id number] 0400 12/15/11 1513  NA 130* --  K 5.1 --  CL 88* --  CO2 22 --  GLUCOSE 169* --  BUN 61* --  CREATININE 8.88* 7.18*  CALCIUM 9.2 --  MG -- --  PHOS -- --   Fasting Lipid Panel  Basename 12/16/11 0400  CHOL 210*  HDL 83  LDLCALC 114*  TRIG 64  CHOLHDL 2.5  LDLDIRECT --   TELE  RSR  ECG  Rsr, 79, poor r wave prog, lvh  ASSESSMENT AND PLAN  1.  USA/CAD:  S/p Cath revealing multivessel dzs w/ PCI/DES to the LCX and Diagonal yesterday.  No chest pain or sob overnight.  Cont asa, plavix, statin, bb.  Pt reports prior h/o melena with asa.  Will have to watch this closely in outpt setting.  2.  HTN:  BP trending high.  Meds given off schedule yesterday 2/2 transfer from OSH, cath, etc.  Follow today after dialysis and adjust meds as necessary.  3.  HL:  Cont statin.  LDL 114.  4.  ESRD:  Nephrology contacted last night and will see this AM.  Due for dialysis today.  5.  Bronchitis:  Cont ceftin, which was started in outpt setting.  6.  Dispo:  prob home today after dialysis.  Signed, Murray Hodgkins NP  I have personally seen and examined this patient with Ignacia Bayley, NP. I agree with the assessment and plan as outlined above. S/p PTCA/DES to Circumflex and Diagonal. Doing well. Will need one year of ASA and Plavix. Will d/c home today after dialysis.   Aaditya Letizia 7:48 AM 12/16/2011

## 2011-12-16 NOTE — Consult Note (Signed)
[facility] Renal Consultation Note  Indication for Consultation:  Management of ESRD/hemodialysis; anemia, hypertension/volume and secondary hyperparathyroidism  HPI: Shawna [name] is a 71 y.o. female with ESRD on HD on MWF at the [facility] in [place] who had sudden onset of substernal chest pain radiating into left arm and left upper back on Saturday night 6/1 and, after evaluation by [facility] (Dr. Terald Sleeper) at [facility] in [place] this week, was transported to [facility] for cardiac catheterization yesterday.  Stress echocardiogram on 6/5 was abnormal with hypokinesis of the lateral wall at peak heart rate, so the patient was premedicated for IVP dye allergy and sent for catheterization, which indicated triple vessel CAD with severe stenosis in the circumflex and diagonal branches, both of which received successful PTCA.  She is currently stable and will likely be discharged home after dialysis.   Past Medical History  Diagnosis Date  . ESRD (end stage renal disease) on dialysis 12/15/11    [place]; Mon; Wed; Fri  . Hypertension   . CHF (congestive heart failure)   . Anginal pain   . Chronic bronchitis 12/15/11    "have had it last 4 wk"  . Pneumonia ~ 2010  . Anemia   . History of blood transfusion 07/2011    "first time"  . GERD (gastroesophageal reflux disease)   . History of stomach ulcers   . History of lower GI bleeding   . Arthritis     "hands and feet"  . History of gout   . Anxiety   . Ovarian cancer 1992  . Colon cancer   . CAD (coronary artery disease)     a. 12/2011 s/p DES to LCX and Diag   Past Surgical History  Procedure Date  . Coronary angioplasty with stent placement 12/15/11    "2"  . Abdominal hysterectomy 1992  . Appendectomy 06/1990  . Tubal ligation 1980's  . Av fistula placement 07/2009    left upper arm  . Thrombectomy / arteriovenous graft revision     left upper arm  . Colon resection 1992   No family  history on file.  reports that she has never smoked. She has never used smokeless tobacco. She reports that she does not drink alcohol or use illicit drugs. Allergies  Allergen Reactions  . Aspirin Other (See Comments)    Mess up her stomach; "makes my bowels have blood in them"  . Contrast Media (Iodinated Diagnostic Agents) Itching  . Macrodantin (Nitrofurantoin Macrocrystal) Other (See Comments)    "broke me out in big old knots all over my body; had to go to ER"  . Penicillins Other (See Comments)    "makes me real weak when I take it; like I'll pass out"  . Bactrim (Sulfamethoxazole W-Trimethoprim) Rash  . Sulfa Antibiotics Rash   Prior to Admission medications   Medication Sig Start Date End Date Taking? Authorizing Provider  ALPRAZolam (XANAX) 0.25 MG tablet Take 0.25-0.5 mg by mouth at bedtime.   Yes Historical Provider, MD  amLODipine (NORVASC) 10 MG tablet Take 10 mg by mouth daily.   Yes Historical Provider, MD  cloNIDine (CATAPRES) 0.2 MG tablet Take 0.2 mg by mouth 2 (two) times daily.   Yes Historical Provider, MD  folic acid-vitamin b complex-vitamin c-selenium-zinc (DIALYVITE) 3 MG TABS Take 1 tablet by mouth daily.   Yes Historical Provider, MD  guaiFENesin (MUCINEX) 600 MG 12 hr tablet Take 1,200 mg by mouth 2 (two) times daily.  Yes Historical Provider, MD  iron polysaccharides (NIFEREX) 150 MG capsule Take 150 mg by mouth 2 (two) times daily.   Yes Historical Provider, MD  labetalol (NORMODYNE) 200 MG tablet Take 200 mg by mouth 2 (two) times daily.   Yes Historical Provider, MD  lanthanum (FOSRENOL) 500 MG chewable tablet Chew 1,000 mg by mouth 3 (three) times daily with meals.   Yes Historical Provider, MD  loratadine (CLARITIN) 10 MG tablet Take 10 mg by mouth daily as needed. For allergies.   Yes Historical Provider, MD  omeprazole (PRILOSEC) 20 MG capsule Take 20 mg by mouth daily.   Yes Historical Provider, MD   Labs:  Results for orders placed during the  hospital encounter of 12/15/11 (from the past 48 hour(s))  CBC     Status: Abnormal   Collection Time   12/15/11  3:13 PM      Component Value Range Comment   WBC 5.9  4.0 - 10.5 (K/uL)    RBC 3.94  3.87 - 5.11 (MIL/uL)    Hemoglobin 11.7 (*) 12.0 - 15.0 (g/dL)    HCT 36.8  36.0 - 46.0 (%)    MCV 93.4  78.0 - 100.0 (fL)    MCH 29.7  26.0 - 34.0 (pg)    MCHC 31.8  30.0 - 36.0 (g/dL)    RDW 17.1 (*) 11.5 - 15.5 (%)    Platelets 224  150 - 400 (K/uL)   CREATININE, SERUM     Status: Abnormal   Collection Time   12/15/11  3:13 PM      Component Value Range Comment   Creatinine, Ser 7.18 (*) 0.50 - 1.10 (mg/dL)    GFR calc non Af Amer 5 (*) >90 (mL/min)    GFR calc Af Amer 6 (*) >90 (mL/min)   POCT ACTIVATED CLOTTING TIME     Status: Normal   Collection Time   12/15/11  4:20 PM      Component Value Range Comment   Activated Clotting Time 325     BASIC METABOLIC PANEL     Status: Abnormal   Collection Time   12/16/11  4:00 AM      Component Value Range Comment   Sodium 130 (*) 135 - 145 (mEq/L)    Potassium 5.1  3.5 - 5.1 (mEq/L)    Chloride 88 (*) 96 - 112 (mEq/L)    CO2 22  19 - 32 (mEq/L)    Glucose, Bld 169 (*) 70 - 99 (mg/dL)    BUN 61 (*) 6 - 23 (mg/dL)    Creatinine, Ser 8.88 (*) 0.50 - 1.10 (mg/dL)    Calcium 9.2  8.4 - 10.5 (mg/dL)    GFR calc non Af Amer 4 (*) >90 (mL/min)    GFR calc Af Amer 5 (*) >90 (mL/min)   CBC     Status: Abnormal   Collection Time   12/16/11  4:00 AM      Component Value Range Comment   WBC 6.9  4.0 - 10.5 (K/uL)    RBC 3.57 (*) 3.87 - 5.11 (MIL/uL)    Hemoglobin 10.8 (*) 12.0 - 15.0 (g/dL)    HCT 33.1 (*) 36.0 - 46.0 (%)    MCV 92.7  78.0 - 100.0 (fL)    MCH 30.3  26.0 - 34.0 (pg)    MCHC 32.6  30.0 - 36.0 (g/dL)    RDW 17.3 (*) 11.5 - 15.5 (%)    Platelets 247  150 - 400 (K/uL)  LIPID PANEL     Status: Abnormal   Collection Time   12/16/11  4:00 AM      Component Value Range Comment   Cholesterol 210 (*) 0 - 200 (mg/dL)    Triglycerides 64   <150 (mg/dL)    HDL 83  >39 (mg/dL)    Total CHOL/HDL Ratio 2.5      VLDL 13  0 - 40 (mg/dL)    LDL Cholesterol 114 (*) 0 - 99 (mg/dL)    Constitutional: negative for chills, fatigue, fevers and sweats Respiratory: negative for cough, dyspnea on exertion and wheezing Cardiovascular: negative for chest pain, dyspnea, exertional chest pressure/discomfort, orthopnea and palpitations Gastrointestinal: negative for abdominal pain, diarrhea, nausea and vomiting Genitourinary:negative for dysuria and frequency, nonoliguric Musculoskeletal:negative for arthralgias, back pain, muscle weakness and myalgias Neurological: negative for coordination problems, dizziness, headaches and weakness  Physical Exam: Filed Vitals:   12/16/11 0753  BP: 171/65  Pulse: 84  Temp: 98 F (36.7 C)  Resp: 20     General appearance: alert, cooperative and no distress Head: Normocephalic, without obvious abnormality, atraumatic Neck: no adenopathy, no carotid bruit, no JVD and supple, symmetrical, trachea midline Resp: clear to auscultation bilaterally Cardio: regular rate and rhythm, S1, S2 normal, no murmur, click, rub or gallop GI: soft, non-tender; bowel sounds normal; no masses,  no organomegaly Extremities: extremities normal, atraumatic, no cyanosis or edema Neurologic: Grossly normal Dialysis Access: AVF @ LUA with + bruit    Dialysis Orders: Center: [facility] in [place] on MWF . EDW 64.5 kg   HD Bath 2K/2.5Ca  Time 3 hrs   Heparin 2000 U bolus, then 800 U/hr (none during last hr). Access AVF @ LUA  BFR 300 DFR 600    Hectoral 3.5 mcg IV/HD  Epogen 3300 Units IV/HD  Venofer  0  Assessment/Plan: 1. CAD - s/p cardiac cath indicating multivessel disease, s/p PCTA to the LCX and diagonal yesterday; will require ASA and Plavix per Cardiology.  2. ESRD -  HD on MWF at [facility] in [place]; pre-HD K 5.1.  HD pending. 3. Hypertension/volume  - BP most recently 171/65 on Labetalol 400 mg bid, Amlodipine 10 mg qd, and  Clonidine 0.2 mg bid; currently wt same as EDW of 64.5 kg.  No fluid removal with HD. 4. Anemia  - Hgb 10.8 on outpatient Epogen 3300 U.  Aranesp 25 mcg today. 5. Metabolic bone disease -  Ca 9.2, on Hectorol per HD and Fosrenol 1 g as binder. 6. History of GERD - on Famotidine.  LYLES,CHARLES 12/16/2011, 11:20 AM   Attending Nephrologist: Fleet Contras, MD I have seen and examined this patient and agree with plan .  Usual HD is MWF and Dw is 64.5.  She is .8 above that and thus will plan to pull 1 kg at HD.  Note plans for DC after HD.  She will Fu with her nephrologist in [place]. Emran Molzahn T,MD 12/16/2011 12:28 PM

## 2011-12-30 ENCOUNTER — Encounter: Payer: Self-pay | Admitting: *Deleted

## 2011-12-30 NOTE — Progress Notes (Signed)
Patient walked into office with c/o of rash that started last night with whelps and itching all over upper torso and lower buttocks. Patient recently started plavix and protonix after d/c from [facility]. No c/o dizziness,chest pain or sob.

## 2012-01-02 ENCOUNTER — Telehealth: Payer: Self-pay | Admitting: *Deleted

## 2012-01-02 NOTE — Telephone Encounter (Signed)
Recommend pt take Benadryl 25 mg 1-2 tabs q4hr prn, max dose 1 gram/4hr and 4 gram/24hr. Call office in 3-4 days with update. Per Gene PA

## 2012-01-02 NOTE — Progress Notes (Signed)
Recommend pt take Benadryl 25 mg 1-2 tabs q4hr prn, max dose 1 gram/4hr and 4 gram/24hr. Call office in 3-4 days with update.

## 2012-01-02 NOTE — Telephone Encounter (Signed)
Left message for patient to call office.  

## 2012-01-02 NOTE — Progress Notes (Signed)
See telephone message with response to this document.

## 2012-01-03 NOTE — Telephone Encounter (Signed)
Patient informed and verbalized understanding of plan. Patient states she will give report to provider at her office visit on Thursday.

## 2012-01-05 ENCOUNTER — Ambulatory Visit (INDEPENDENT_AMBULATORY_CARE_PROVIDER_SITE_OTHER): Payer: Medicare Other | Admitting: Physician Assistant

## 2012-01-05 ENCOUNTER — Encounter: Payer: Self-pay | Admitting: Physician Assistant

## 2012-01-05 VITALS — BP 149/69 | HR 73 | Resp 18 | Ht 61.0 in | Wt 142.0 lb

## 2012-01-05 DIAGNOSIS — R21 Rash and other nonspecific skin eruption: Secondary | ICD-10-CM

## 2012-01-05 DIAGNOSIS — I2510 Atherosclerotic heart disease of native coronary artery without angina pectoris: Secondary | ICD-10-CM

## 2012-01-05 DIAGNOSIS — E782 Mixed hyperlipidemia: Secondary | ICD-10-CM | POA: Insufficient documentation

## 2012-01-05 DIAGNOSIS — E785 Hyperlipidemia, unspecified: Secondary | ICD-10-CM

## 2012-01-05 DIAGNOSIS — Z79899 Other long term (current) drug therapy: Secondary | ICD-10-CM

## 2012-01-05 DIAGNOSIS — I10 Essential (primary) hypertension: Secondary | ICD-10-CM

## 2012-01-05 MED ORDER — TICAGRELOR 90 MG PO TABS: 90.0000 mg | ORAL_TABLET | Freq: Two times a day (BID) | ORAL | Status: DC

## 2012-01-05 NOTE — Assessment & Plan Note (Signed)
Pruritic, occurring essentially daily. Most likely related to Plavix. Will switch to Brilinta, as noted above.

## 2012-01-05 NOTE — Assessment & Plan Note (Signed)
Patient recently started on a statin. We'll reassess lipid status in 12 weeks. Aggressive management recommended with target LDL 70 or less, if feasible.

## 2012-01-05 NOTE — Patient Instructions (Addendum)
Your physician recommends that you schedule a follow-up appointment in: 3 months with Dr. Domenic Polite.  Your physician has recommended you make the following change in your medication: stop clopidogrel(plavix). Start brilinta 90 mg twice daily. All other medications will remain the same. Your new prescription has been sent to your pharmacy.  Your physician recommends that you return for a FASTING lipid/liver profile: in 12 weeks around 04/06/12 at [facility].

## 2012-01-05 NOTE — Assessment & Plan Note (Addendum)
Quiescent on current medication regimen. Unfortunately, patient has developed a pruritic rash, which she states occurs nightly. I suspect that this most likely is due to Plavix. Therefore, will discontinue Plavix and substitute with Brilinta 90 mg twice a day. Patient to remain on DAPT for at least one year.

## 2012-01-05 NOTE — Progress Notes (Signed)
HPI: Patient presents for post hospital followup from [facility], following initial presentation here at [facility]. She presented with atypical CP and mildly elevated troponins (peak 0.08), with normal MBs, in setting of known ESRD, and no known CAD. She was initially referred for a dobutamine stress echocardiogram, by our team. This yielded evidence of lateral ischemia, and arrangements were made for transfer to [facility] for cardiac catheterization. Patient did require pretreatment for history of contrast allergy.  She was found to have triple vessel CAD with severe CFX and diagonal branch disease, and moderate LAD disease. She underwent successful DES of the CFX and diagonal artery, with no noted complications; EF [id number], no significant MR.  Patient will require concomitant treatment with ASA/Plavix for at least one year. She was started on a statin.  Clinically, patient is much improved, reporting no further CP. Unfortunately, she has developed a nightly rash, pruritic, for which we recently recommended prn Benadryl. She has not been previously treated with Plavix.  Allergies  Allergen Reactions  . Aspirin Other (See Comments)    Mess up her stomach; "makes my bowels have blood in them"  . Contrast Media (Iodinated Diagnostic Agents) Itching  . Macrodantin (Nitrofurantoin Macrocrystal) Other (See Comments)    "broke me out in big old knots all over my body; had to go to ER"  . Penicillins Other (See Comments)    "makes me real weak when I take it; like I'll pass out"  . Bactrim (Sulfamethoxazole W-Trimethoprim) Rash  . Sulfa Antibiotics Rash    Current Outpatient Prescriptions  Medication Sig Dispense Refill  . allopurinol (ZYLOPRIM) 300 MG tablet Take 300 mg by mouth daily.      Marland Kitchen ALPRAZolam (XANAX) 0.25 MG tablet Take 0.25-0.5 mg by mouth at bedtime.      Marland Kitchen amLODipine (NORVASC) 10 MG tablet Take 10 mg by mouth daily.      Marland Kitchen aspirin EC 81 MG EC tablet Take 1 tablet (81 mg total) by mouth daily.      Marland Kitchen  atorvastatin (LIPITOR) 20 MG tablet Take 1 tablet (20 mg total) by mouth daily at 6 PM.  30 tablet  6  . cloNIDine (CATAPRES) 0.2 MG tablet Take 0.2 mg by mouth 2 (two) times daily.      . clopidogrel (PLAVIX) 75 MG tablet Take 1 tablet (75 mg total) by mouth daily with breakfast.  30 tablet  6  . fluconazole (DIFLUCAN) 100 MG tablet Take 100 mg by mouth daily.      . folic acid-vitamin b complex-vitamin c-selenium-zinc (DIALYVITE) 3 MG TABS Take 1 tablet by mouth daily.      . iron polysaccharides (NIFEREX) 150 MG capsule Take 150 mg by mouth 2 (two) times daily.      Marland Kitchen labetalol (NORMODYNE) 200 MG tablet Take 200 mg by mouth 2 (two) times daily.      Marland Kitchen lanthanum (FOSRENOL) 500 MG chewable tablet Chew 1,000 mg by mouth 3 (three) times daily with meals.      . Lidocaine HCl 2.5 % KIT Apply topically.      Marland Kitchen loratadine (CLARITIN) 10 MG tablet Take 10 mg by mouth daily as needed. For allergies.      . nitroGLYCERIN (NITROSTAT) 0.4 MG SL tablet Place 1 tablet (0.4 mg total) under the tongue every 5 (five) minutes x 3 doses as needed for chest pain.  25 tablet  3  . pantoprazole (PROTONIX) 40 MG tablet Take 1 tablet (40 mg total) by mouth daily.  30 tablet  3  . DISCONTD: labetalol (NORMODYNE) 200 MG tablet Take 2 tablets (400 mg total) by mouth 2 (two) times daily.  120 tablet  3    Past Medical History  Diagnosis Date  . ESRD (end stage renal disease) on dialysis 12/15/11    [place]; Mon; Wed; Fri  . Hypertension   . CHF (congestive heart failure)   . Anginal pain   . Chronic bronchitis 12/15/11    "have had it last 4 wk"  . Pneumonia ~ 2010  . Anemia   . History of blood transfusion 07/2011    "first time"  . GERD (gastroesophageal reflux disease)   . History of stomach ulcers   . History of lower GI bleeding   . Arthritis     "hands and feet"  . History of gout   . Anxiety   . Ovarian cancer 1992  . Colon cancer   . CAD (coronary artery disease)     a. 12/2011 s/p DES to LCX and Diag     Past Surgical History  Procedure Date  . Coronary angioplasty with stent placement 12/15/11    "2"  . Abdominal hysterectomy 1992  . Appendectomy 06/1990  . Tubal ligation 1980's  . Av fistula placement 07/2009    left upper arm  . Thrombectomy / arteriovenous graft revision     left upper arm  . Colon resection 1992    History   Social History  . Marital Status: Married    Spouse Name: N/A    Number of Children: N/A  . Years of Education: N/A   Occupational History  . Not on file.   Social History Main Topics  . Smoking status: Never Smoker   . Smokeless tobacco: Never Used  . Alcohol Use: No  . Drug Use: No  . Sexually Active: Yes   Other Topics Concern  . Not on file   Social History Narrative  . No narrative on file    No family history on file.  ROS: no nausea, vomiting; no fever, chills; no melena, hematochezia; no claudication  PHYSICAL EXAM: BP 149/69  Pulse 73  Resp 18  Ht 5\' 1"  (1.549 m)  Wt 142 lb (64.411 kg)  BMI 26.83 kg/m2 GENERAL: 71 year old female, sitting upright; NAD HEENT: NCAT, PERRLA, EOMI; sclera clear; no xanthelasma NECK: palpable bilateral carotid pulses, no bruits; no JVD; no TM LUNGS: CTA bilaterally CARDIAC: RRR (S1, S2); no significant murmurs; no rubs or gallops ABDOMEN: soft, non-tender; intact BS EXTREMETIES: intact distal pulses; no significant peripheral edema SKIN: warm/dry; no obvious rash/lesions MUSCULOSKELETAL: no joint deformity NEURO: no focal deficit; NL affect   EKG: reviewed and available in Electronic Records   ASSESSMENT & PLAN:  CAD (coronary artery disease) Quiescent on current medication regimen. Unfortunately, patient has developed a pruritic rash, which she states occurs nightly. I suspect that this most likely is due to Plavix. Therefore, will discontinue Plavix and substitute with Brilinta 90 mg twice a day. Patient to remain on DAPT for at least one year.  HTN (hypertension) Stable on  current medication regimen. We'll need to continue monitoring closely. Would suggest adding an ACE inhibitor, if needed, for added control.  HLD (hyperlipidemia) Patient recently started on a statin. We'll reassess lipid status in 12 weeks. Aggressive management recommended with target LDL 70 or less, if feasible.  Rash Pruritic, occurring essentially daily. Most likely related to Plavix. Will switch to Brilinta, as noted above.    Gene Hudsyn Champine, PAC

## 2012-01-05 NOTE — Assessment & Plan Note (Signed)
Stable on current medication regimen. We'll need to continue monitoring closely. Would suggest adding an ACE inhibitor, if needed, for added control.

## 2012-01-09 ENCOUNTER — Inpatient Hospital Stay (HOSPITAL_COMMUNITY)
Admission: EM | Admit: 2012-01-09 | Discharge: 2012-01-25 | DRG: 246 | Disposition: A | Discharge status: Home Health | Payer: Medicare Other | Source: Other Acute Inpatient Hospital | Attending: Cardiology | Admitting: Cardiology

## 2012-01-09 ENCOUNTER — Other Ambulatory Visit: Payer: Self-pay

## 2012-01-09 ENCOUNTER — Other Ambulatory Visit: Payer: Self-pay | Admitting: Cardiology

## 2012-01-09 ENCOUNTER — Inpatient Hospital Stay (HOSPITAL_COMMUNITY): Payer: Medicare Other

## 2012-01-09 ENCOUNTER — Encounter (HOSPITAL_COMMUNITY): Payer: Self-pay | Admitting: Surgery

## 2012-01-09 DIAGNOSIS — D62 Acute posthemorrhagic anemia: Secondary | ICD-10-CM

## 2012-01-09 DIAGNOSIS — K5520 Angiodysplasia of colon without hemorrhage: Secondary | ICD-10-CM

## 2012-01-09 DIAGNOSIS — E873 Alkalosis: Secondary | ICD-10-CM | POA: Diagnosis present

## 2012-01-09 DIAGNOSIS — I2510 Atherosclerotic heart disease of native coronary artery without angina pectoris: Secondary | ICD-10-CM | POA: Diagnosis present

## 2012-01-09 DIAGNOSIS — K31811 Angiodysplasia of stomach and duodenum with bleeding: Secondary | ICD-10-CM | POA: Diagnosis present

## 2012-01-09 DIAGNOSIS — Z7982 Long term (current) use of aspirin: Secondary | ICD-10-CM

## 2012-01-09 DIAGNOSIS — I5041 Acute combined systolic (congestive) and diastolic (congestive) heart failure: Secondary | ICD-10-CM | POA: Diagnosis present

## 2012-01-09 DIAGNOSIS — I10 Essential (primary) hypertension: Secondary | ICD-10-CM | POA: Diagnosis present

## 2012-01-09 DIAGNOSIS — I5021 Acute systolic (congestive) heart failure: Secondary | ICD-10-CM | POA: Diagnosis present

## 2012-01-09 DIAGNOSIS — K449 Diaphragmatic hernia without obstruction or gangrene: Secondary | ICD-10-CM | POA: Diagnosis present

## 2012-01-09 DIAGNOSIS — E875 Hyperkalemia: Secondary | ICD-10-CM | POA: Diagnosis present

## 2012-01-09 DIAGNOSIS — K5900 Constipation, unspecified: Secondary | ICD-10-CM | POA: Diagnosis not present

## 2012-01-09 DIAGNOSIS — Z7902 Long term (current) use of antithrombotics/antiplatelets: Secondary | ICD-10-CM

## 2012-01-09 DIAGNOSIS — I5023 Acute on chronic systolic (congestive) heart failure: Secondary | ICD-10-CM | POA: Diagnosis present

## 2012-01-09 DIAGNOSIS — B370 Candidal stomatitis: Secondary | ICD-10-CM | POA: Diagnosis not present

## 2012-01-09 DIAGNOSIS — K259 Gastric ulcer, unspecified as acute or chronic, without hemorrhage or perforation: Secondary | ICD-10-CM | POA: Diagnosis present

## 2012-01-09 DIAGNOSIS — J40 Bronchitis, not specified as acute or chronic: Secondary | ICD-10-CM

## 2012-01-09 DIAGNOSIS — I120 Hypertensive chronic kidney disease with stage 5 chronic kidney disease or end stage renal disease: Secondary | ICD-10-CM | POA: Diagnosis present

## 2012-01-09 DIAGNOSIS — K2970 Gastritis, unspecified, without bleeding: Secondary | ICD-10-CM | POA: Diagnosis present

## 2012-01-09 DIAGNOSIS — K922 Gastrointestinal hemorrhage, unspecified: Secondary | ICD-10-CM

## 2012-01-09 DIAGNOSIS — J42 Unspecified chronic bronchitis: Secondary | ICD-10-CM | POA: Diagnosis present

## 2012-01-09 DIAGNOSIS — M109 Gout, unspecified: Secondary | ICD-10-CM | POA: Diagnosis present

## 2012-01-09 DIAGNOSIS — K5521 Angiodysplasia of colon with hemorrhage: Secondary | ICD-10-CM

## 2012-01-09 DIAGNOSIS — E785 Hyperlipidemia, unspecified: Secondary | ICD-10-CM | POA: Diagnosis present

## 2012-01-09 DIAGNOSIS — Z882 Allergy status to sulfonamides status: Secondary | ICD-10-CM

## 2012-01-09 DIAGNOSIS — D631 Anemia in chronic kidney disease: Secondary | ICD-10-CM | POA: Diagnosis present

## 2012-01-09 DIAGNOSIS — Y841 Kidney dialysis as the cause of abnormal reaction of the patient, or of later complication, without mention of misadventure at the time of the procedure: Secondary | ICD-10-CM | POA: Diagnosis not present

## 2012-01-09 DIAGNOSIS — L2989 Other pruritus: Secondary | ICD-10-CM | POA: Diagnosis not present

## 2012-01-09 DIAGNOSIS — F411 Generalized anxiety disorder: Secondary | ICD-10-CM | POA: Diagnosis present

## 2012-01-09 DIAGNOSIS — T50995A Adverse effect of other drugs, medicaments and biological substances, initial encounter: Secondary | ICD-10-CM | POA: Diagnosis not present

## 2012-01-09 DIAGNOSIS — Z9861 Coronary angioplasty status: Secondary | ICD-10-CM

## 2012-01-09 DIAGNOSIS — Z79899 Other long term (current) drug therapy: Secondary | ICD-10-CM

## 2012-01-09 DIAGNOSIS — Z888 Allergy status to other drugs, medicaments and biological substances status: Secondary | ICD-10-CM

## 2012-01-09 DIAGNOSIS — I953 Hypotension of hemodialysis: Secondary | ICD-10-CM | POA: Diagnosis not present

## 2012-01-09 DIAGNOSIS — I200 Unstable angina: Secondary | ICD-10-CM

## 2012-01-09 DIAGNOSIS — Z992 Dependence on renal dialysis: Secondary | ICD-10-CM | POA: Diagnosis present

## 2012-01-09 DIAGNOSIS — Z91041 Radiographic dye allergy status: Secondary | ICD-10-CM

## 2012-01-09 DIAGNOSIS — M129 Arthropathy, unspecified: Secondary | ICD-10-CM | POA: Diagnosis present

## 2012-01-09 DIAGNOSIS — Z886 Allergy status to analgesic agent status: Secondary | ICD-10-CM

## 2012-01-09 DIAGNOSIS — I219 Acute myocardial infarction, unspecified: Secondary | ICD-10-CM | POA: Diagnosis present

## 2012-01-09 DIAGNOSIS — L500 Allergic urticaria: Secondary | ICD-10-CM | POA: Diagnosis not present

## 2012-01-09 DIAGNOSIS — L298 Other pruritus: Secondary | ICD-10-CM | POA: Diagnosis not present

## 2012-01-09 DIAGNOSIS — K219 Gastro-esophageal reflux disease without esophagitis: Secondary | ICD-10-CM

## 2012-01-09 DIAGNOSIS — R079 Chest pain, unspecified: Secondary | ICD-10-CM

## 2012-01-09 DIAGNOSIS — Z880 Allergy status to penicillin: Secondary | ICD-10-CM

## 2012-01-09 DIAGNOSIS — R042 Hemoptysis: Secondary | ICD-10-CM | POA: Diagnosis not present

## 2012-01-09 DIAGNOSIS — I214 Non-ST elevation (NSTEMI) myocardial infarction: Principal | ICD-10-CM | POA: Diagnosis present

## 2012-01-09 DIAGNOSIS — Z8543 Personal history of malignant neoplasm of ovary: Secondary | ICD-10-CM

## 2012-01-09 DIAGNOSIS — Z85038 Personal history of other malignant neoplasm of large intestine: Secondary | ICD-10-CM

## 2012-01-09 DIAGNOSIS — Z8701 Personal history of pneumonia (recurrent): Secondary | ICD-10-CM

## 2012-01-09 DIAGNOSIS — N186 End stage renal disease: Secondary | ICD-10-CM | POA: Diagnosis present

## 2012-01-09 DIAGNOSIS — I509 Heart failure, unspecified: Secondary | ICD-10-CM | POA: Diagnosis present

## 2012-01-09 DIAGNOSIS — J9600 Acute respiratory failure, unspecified whether with hypoxia or hypercapnia: Secondary | ICD-10-CM | POA: Diagnosis present

## 2012-01-09 LAB — HEPARIN LEVEL (UNFRACTIONATED): Heparin Unfractionated: 0.78 IU/mL — ABNORMAL HIGH (ref 0.30–0.70)

## 2012-01-09 LAB — CBC
HCT: 30.1 % — ABNORMAL LOW (ref 36.0–46.0)
Hemoglobin: 9.6 g/dL — ABNORMAL LOW (ref 12.0–15.0)
MCH: 29.4 pg (ref 26.0–34.0)
MCHC: 31.9 g/dL (ref 30.0–36.0)
RDW: 17.4 % — ABNORMAL HIGH (ref 11.5–15.5)

## 2012-01-09 LAB — POCT I-STAT 3, ART BLOOD GAS (G3+)
Bicarbonate: 28.3 mEq/L — ABNORMAL HIGH (ref 20.0–24.0)
Patient temperature: 100.2
TCO2: 29 mmol/L (ref 0–100)
pH, Arterial: 7.528 — ABNORMAL HIGH (ref 7.350–7.400)

## 2012-01-09 LAB — CARDIAC PANEL(CRET KIN+CKTOT+MB+TROPI)
CK, MB: 2.7 ng/mL (ref 0.3–4.0)
CK, MB: 2.8 ng/mL (ref 0.3–4.0)
Relative Index: 1.8 (ref 0.0–2.5)
Troponin I: 1.56 ng/mL (ref <0.30)

## 2012-01-09 LAB — BASIC METABOLIC PANEL
BUN: 40 mg/dL — ABNORMAL HIGH (ref 6–23)
Chloride: 93 mEq/L — ABNORMAL LOW (ref 96–112)
GFR calc Af Amer: 5 mL/min — ABNORMAL LOW (ref >90)
GFR calc non Af Amer: 5 mL/min — ABNORMAL LOW (ref >90)
Potassium: 4.2 mEq/L (ref 3.5–5.1)
Sodium: 136 mEq/L (ref 135–145)

## 2012-01-09 LAB — HEPATIC FUNCTION PANEL
AST: 32 U/L (ref 0–37)
Albumin: 3.1 g/dL — ABNORMAL LOW (ref 3.5–5.2)
Bilirubin, Direct: 0.2 mg/dL (ref 0.0–0.3)
Total Bilirubin: 0.5 mg/dL (ref 0.3–1.2)

## 2012-01-09 LAB — PHOSPHORUS: Phosphorus: 3.7 mg/dL (ref 2.3–4.6)

## 2012-01-09 LAB — MAGNESIUM: Magnesium: 1.4 mg/dL — ABNORMAL LOW (ref 1.5–2.5)

## 2012-01-09 LAB — MRSA PCR SCREENING: MRSA by PCR: NEGATIVE

## 2012-01-09 IMAGING — CR DG CHEST 1V PORT SAME DAY
1 series · 1 of 1 positions shown · non-contrast
Comparison: [DATE].

CLINICAL DATA: Acute respiratory failure.

PORTABLE CHEST - 1 VIEW SAME DAY

[AP]
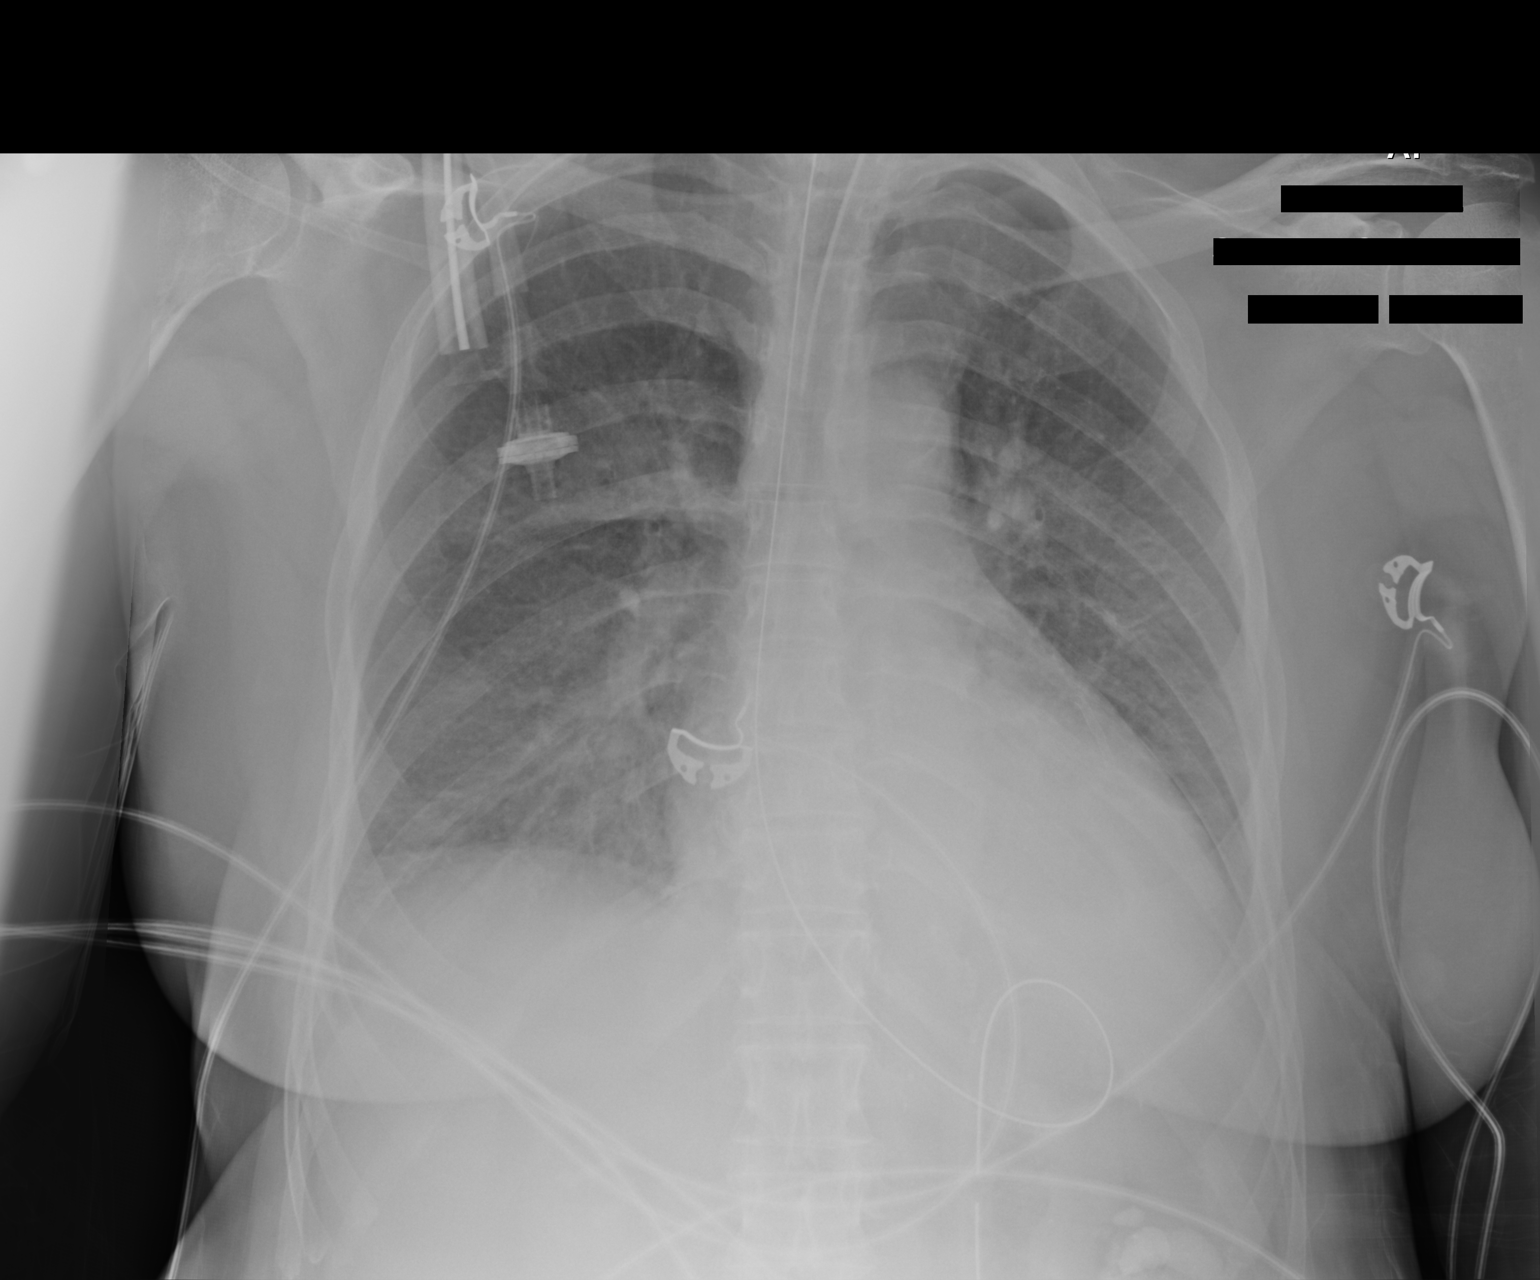

[1 of 1 positions shown; findings below may reference images not displayed]

FINDINGS: Endotracheal tube is in satisfactory position.
Nasogastric tube is followed into the stomach.  Heart size stable.
Mild diffuse interstitial prominence indistinctness with bibasilar
air space disease.  Right perihilar consolidation.  Left pleural
effusion.
IMPRESSION: Congestive heart failure with minimal interval improvement.

## 2012-01-09 MED ORDER — FENTANYL CITRATE 0.05 MG/ML IJ SOLN
25.0000 ug | INTRAMUSCULAR | Status: DC | PRN
  Administered 2012-01-10 (×2): 50 ug via INTRAVENOUS
  Filled 2012-01-09: qty 2

## 2012-01-09 MED ORDER — LANTHANUM CARBONATE 500 MG PO CHEW
1000.0000 mg | CHEWABLE_TABLET | Freq: Three times a day (TID) | ORAL | Status: DC
  Administered 2012-01-10 – 2012-01-25 (×33): 1000 mg via ORAL
  Filled 2012-01-09 (×50): qty 2

## 2012-01-09 MED ORDER — SODIUM CHLORIDE 0.9 % IJ SOLN: 3.0000 mL | INTRAMUSCULAR | Status: DC | PRN

## 2012-01-09 MED ORDER — ALLOPURINOL 300 MG PO TABS
300.0000 mg | ORAL_TABLET | Freq: Every day | ORAL | Status: DC
  Administered 2012-01-09 – 2012-01-24 (×9): 300 mg via ORAL
  Filled 2012-01-09 (×18): qty 1

## 2012-01-09 MED ORDER — NITROGLYCERIN IN D5W 200-5 MCG/ML-% IV SOLN
2.0000 ug/min | INTRAVENOUS | Status: DC
  Administered 2012-01-09: 65 ug/min via INTRAVENOUS

## 2012-01-09 MED ORDER — ATORVASTATIN CALCIUM 20 MG PO TABS
20.0000 mg | ORAL_TABLET | Freq: Every day | ORAL | Status: DC
  Administered 2012-01-09 – 2012-01-25 (×16): 20 mg via ORAL
  Filled 2012-01-09 (×17): qty 1

## 2012-01-09 MED ORDER — POLYSACCHARIDE IRON COMPLEX 150 MG PO CAPS
150.0000 mg | ORAL_CAPSULE | Freq: Two times a day (BID) | ORAL | Status: DC
  Administered 2012-01-09 – 2012-01-15 (×10): 150 mg via ORAL
  Filled 2012-01-09 (×16): qty 1

## 2012-01-09 MED ORDER — PANTOPRAZOLE SODIUM 20 MG PO TBEC
20.0000 mg | DELAYED_RELEASE_TABLET | Freq: Every day | ORAL | Status: DC
  Filled 2012-01-09: qty 1

## 2012-01-09 MED ORDER — LABETALOL HCL 200 MG PO TABS
200.0000 mg | ORAL_TABLET | Freq: Two times a day (BID) | ORAL | Status: DC
  Administered 2012-01-09 – 2012-01-11 (×3): 200 mg via ORAL
  Filled 2012-01-09 (×5): qty 1

## 2012-01-09 MED ORDER — ZOLPIDEM TARTRATE 5 MG PO TABS: 5.0000 mg | ORAL_TABLET | Freq: Every evening | ORAL | Status: DC | PRN

## 2012-01-09 MED ORDER — DIALYVITE 3000 3 MG PO TABS: 1.0000 | ORAL_TABLET | Freq: Every day | ORAL | Status: DC

## 2012-01-09 MED ORDER — HEPARIN (PORCINE) IN NACL 100-0.45 UNIT/ML-% IJ SOLN
650.0000 [IU]/h | INTRAMUSCULAR | Status: DC
  Administered 2012-01-09: 800 [IU]/h via INTRAVENOUS
  Filled 2012-01-09 (×2): qty 250

## 2012-01-09 MED ORDER — AMLODIPINE BESYLATE 10 MG PO TABS
10.0000 mg | ORAL_TABLET | Freq: Every day | ORAL | Status: DC
  Administered 2012-01-09 – 2012-01-15 (×5): 10 mg via ORAL
  Filled 2012-01-09 (×8): qty 1

## 2012-01-09 MED ORDER — PANTOPRAZOLE SODIUM 40 MG IV SOLR: 40.0000 mg | INTRAVENOUS | Status: DC

## 2012-01-09 MED ORDER — TICAGRELOR 90 MG PO TABS
90.0000 mg | ORAL_TABLET | Freq: Two times a day (BID) | ORAL | Status: DC
  Administered 2012-01-09 – 2012-01-25 (×31): 90 mg via ORAL
  Filled 2012-01-09 (×33): qty 1

## 2012-01-09 MED ORDER — METHYLPREDNISOLONE SODIUM SUCC 125 MG IJ SOLR
60.0000 mg | Freq: Once | INTRAMUSCULAR | Status: AC
  Administered 2012-01-10: 60 mg via INTRAVENOUS

## 2012-01-09 MED ORDER — RENA-VITE PO TABS
1.0000 | ORAL_TABLET | Freq: Every day | ORAL | Status: DC
  Administered 2012-01-09 – 2012-01-11 (×3): 1 via ORAL
  Filled 2012-01-09 (×4): qty 1

## 2012-01-09 MED ORDER — DEXTROSE 10 % IV SOLN: INTRAVENOUS | Status: DC | PRN

## 2012-01-09 MED ORDER — ASPIRIN EC 81 MG PO TBEC
81.0000 mg | DELAYED_RELEASE_TABLET | Freq: Every day | ORAL | Status: DC
  Filled 2012-01-09: qty 1

## 2012-01-09 MED ORDER — FLUCONAZOLE 100 MG PO TABS
100.0000 mg | ORAL_TABLET | Freq: Every day | ORAL | Status: DC
  Administered 2012-01-09 – 2012-01-11 (×3): 100 mg via ORAL
  Filled 2012-01-09 (×4): qty 1

## 2012-01-09 MED ORDER — ACETAMINOPHEN 325 MG PO TABS
650.0000 mg | ORAL_TABLET | ORAL | Status: DC | PRN
  Administered 2012-01-15 – 2012-01-22 (×2): 650 mg via ORAL
  Filled 2012-01-09 (×2): qty 2

## 2012-01-09 MED ORDER — BIOTENE DRY MOUTH MT LIQD
15.0000 mL | Freq: Four times a day (QID) | OROMUCOSAL | Status: DC
  Administered 2012-01-09 – 2012-01-13 (×14): 15 mL via OROMUCOSAL

## 2012-01-09 MED ORDER — SODIUM CHLORIDE 0.9 % IV SOLN: 250.0000 mL | INTRAVENOUS | Status: DC | PRN

## 2012-01-09 MED ORDER — NITROGLYCERIN 0.4 MG SL SUBL
0.4000 mg | SUBLINGUAL_TABLET | SUBLINGUAL | Status: DC | PRN
  Administered 2012-01-15: 0.4 mg via SUBLINGUAL
  Filled 2012-01-09: qty 25

## 2012-01-09 MED ORDER — PROPOFOL 10 MG/ML IV EMUL
5.0000 ug/kg/min | INTRAVENOUS | Status: DC
  Administered 2012-01-09: 15 ug/kg/min via INTRAVENOUS
  Administered 2012-01-10: 20 ug/kg/min via INTRAVENOUS
  Filled 2012-01-09 (×2): qty 100

## 2012-01-09 MED ORDER — CHLORHEXIDINE GLUCONATE 0.12 % MT SOLN
15.0000 mL | Freq: Two times a day (BID) | OROMUCOSAL | Status: DC
  Administered 2012-01-09 – 2012-01-13 (×8): 15 mL via OROMUCOSAL
  Filled 2012-01-09 (×6): qty 15

## 2012-01-09 MED ORDER — INSULIN ASPART 100 UNIT/ML ~~LOC~~ SOLN: 0.0000 [IU] | SUBCUTANEOUS | Status: DC

## 2012-01-09 MED ORDER — CLONIDINE HCL 0.2 MG PO TABS
0.2000 mg | ORAL_TABLET | Freq: Two times a day (BID) | ORAL | Status: DC
  Administered 2012-01-09 – 2012-01-12 (×6): 0.2 mg via ORAL
  Filled 2012-01-09 (×9): qty 1

## 2012-01-09 MED ORDER — DIPHENHYDRAMINE HCL 50 MG/ML IJ SOLN
25.0000 mg | Freq: Once | INTRAMUSCULAR | Status: DC
  Filled 2012-01-09: qty 1

## 2012-01-09 MED ORDER — METHYLPREDNISOLONE SODIUM SUCC 125 MG IJ SOLR
125.0000 mg | Freq: Once | INTRAMUSCULAR | Status: AC
  Administered 2012-01-09: 125 mg via INTRAVENOUS
  Filled 2012-01-09 (×2): qty 2

## 2012-01-09 MED ORDER — ALPRAZOLAM 0.25 MG PO TABS
0.2500 mg | ORAL_TABLET | Freq: Every evening | ORAL | Status: DC | PRN
  Administered 2012-01-10 – 2012-01-22 (×5): 0.25 mg via ORAL
  Filled 2012-01-09 (×6): qty 1

## 2012-01-09 MED ORDER — DEXTROSE-NACL 5-0.45 % IV SOLN
INTRAVENOUS | Status: DC
  Administered 2012-01-09 – 2012-01-12 (×3): 10 mL/h via INTRAVENOUS

## 2012-01-09 MED ORDER — ONDANSETRON HCL 4 MG/2ML IJ SOLN
4.0000 mg | Freq: Four times a day (QID) | INTRAMUSCULAR | Status: DC | PRN
  Administered 2012-01-11: 4 mg via INTRAVENOUS
  Filled 2012-01-09: qty 2

## 2012-01-09 MED ORDER — PANTOPRAZOLE SODIUM 40 MG PO PACK
20.0000 mg | PACK | Freq: Every day | ORAL | Status: DC
  Administered 2012-01-09 – 2012-01-12 (×4): 20 mg
  Administered 2012-01-14: 18:00:00
  Filled 2012-01-09 (×9): qty 20

## 2012-01-09 MED ORDER — SODIUM CHLORIDE 0.9 % IJ SOLN
3.0000 mL | Freq: Two times a day (BID) | INTRAMUSCULAR | Status: DC
  Administered 2012-01-09 – 2012-01-25 (×25): 3 mL via INTRAVENOUS

## 2012-01-09 MED ORDER — ASPIRIN 81 MG PO CHEW
81.0000 mg | CHEWABLE_TABLET | Freq: Every day | ORAL | Status: DC
  Administered 2012-01-09 – 2012-01-25 (×17): 81 mg via ORAL
  Filled 2012-01-09 (×17): qty 1

## 2012-01-09 MED FILL — Nitroglycerin IV Soln 200 MCG/ML in D5W: INTRAVENOUS | Qty: 250 | Status: AC

## 2012-01-09 MED FILL — Heparin Sodium (Porcine) 100 Unt/ML in Sodium Chloride 0.45%: INTRAMUSCULAR | Qty: 250 | Status: AC

## 2012-01-09 NOTE — H&P (Signed)
NAME:  Shawna [name], Shawna [name] ROOM: 250  UNIT NUMBER:  [id number] LOCATION: [facility] 250 06 ADM/VISIT DATE:  01/08/2012   ADM PHYSCaryl Bis:  [id number] DOB: 06-16-1941   SERVICE:  [facility]  CARDIOLOGIST:  [facility]  PRIMARY CARE PHYSICIAN:  Hospitalist service  HISTORY OF PRESENT ILLNESS:  The patient is a 71 year old female who was, a few weeks ago, admitted to [facility] after she ruled in for non-ST elevation myocardial infarction.  She initially underwent a dobutamine stress echocardiogram, which yielded lateral ischemia.  She was then referred for cardiac catheterization and was found to have triple vessel CAD with severe circumflex disease and diagonal branch disease and moderate LAD disease.  She underwent successful DES of the circumflex and diagonal artery with no noted complications.  Ejection fraction was 55%.  No significant mitral regurgitation.  The patient was placed on aspirin and Plavix; however, she developed a rash secondary to Plavix.  She called in to the office and was given a prescription for Brilinta.  Unfortunately, she never picked up her Brilinta.  Of note also, the patient has end-stage renal disease and is on hemodialysis.  The patient was admitted on 01/08/2012 at the [facility] after complaining of chest tightness that woke her up in the middle of the night, followed by an episode of cough and shortness of breath that lasted 30 minutes.  Initially, the patient had negative cardiac enzyme markers.  Chest x-ray shows, however, cardiomegaly with central venous congestion.  The patient underwent hemodialysis, and the plan was to let her go home.  However, it was noted that the patient was not taking Brilinta, and I am not sure that even Brilinta was started during this hospitalization.  Subsequently, yesterday, the patient became acutely short of breath and in respiratory distress with an x-ray that showed marked pulmonary edema.  She was intubated  and was placed on mechanical ventilation.  She is currently hemodynamically stable with a significant rise in her troponins to 2.8.  EKG showed left ventricular hypertrophy and nonspecific ST-T wave changes, although there is some ST-segment depression inferior leads.  The patient is currently sedated on propofol but hemodynamically stable, albeit with a sinus tachycardia.  Saturations 99%, and she is on minimal ventilator settings with a peak airway pressure of 20 cm of water and PEEP of 5 cm of water.  She is on 1 to 3.8 I:E ratio with a tidal volume of 500, peak flow of 60 with a ramp waveform.  She is also on propofol for sedation.  PAST MEDICAL HISTORY: 1. End-stage renal disease on hemodialysis Monday, Wednesday, Friday. 2. Hypertension. 3. Congestive heart failure. 4. History of angina. 5. Chronic bronchitis. 6. Pneumonia. 7. Anemia. 8. History of blood transfusion. 9. GERD. 10. Stomach ulcers. 11. History of GI bleeding. 12. Arthritis. 13. History of gout. 14. Anxiety. 15. Ovarian cancer. 16. Colon cancer. 17. CAD, 12/29/2011, status post DES to the circumflex and diagonal. a. Positive dobutamine stress echocardiogram after non-ST elevation myocardial infarction.  PAST SURGICAL HISTORY: 1. Coronary angioplasty with stent placement 12/15/2011. 2. Abdominal hysterectomy. 3. Appendectomy. 4. Tubal ligation. 5. AV fistula placement left upper arm. 6. Thrombectomy/AV graft revision, left upper arm. 7. Colon resection.  MEDICATIONS" 1. Protonix. 2. Heparin.  SOCIAL HISTORY:  The patient is married.  The patient never smoked.  She denies any alcohol use; this is all per records as the patient is unresponsive.  REVIEW OF SYSTEMS:  Unobtainable because the patient is intubated.  PHYSICAL EXAMINATION:  Vital signs:  Blood pressure 109/71, heart rate is 111, respirations 14, temperature 99, initial temperature earlier was 100 degrees.  General:  A 71 year old female, intubated.   HEENT:  PERRLA, EOMI.  Sclerae clear.  No xanthelasma.  Neck:  Palpable bilateral carotid pulses.  No bruits.  JVD is difficult to evaluate.  Lungs:  Scattered rhonchi with crackles bilaterally.  Heart:  Regular rate and rhythm, tachycardic.  Normal S1, S2.  Positive S3, but no pathological murmurs.  Abdomen:  Soft, unable to evaluate tenderness due to the fact the patient is on propofol.  Extremities:  Intact distal pulses.  No significant peripheral edema.  Skin:  Warm and dry.  Musculoskeletal:  No joint deformities.  Neuro:  No focal deficits.  Normal affect.  LABORATORY WORK:  Blood gas:  PH 7.59, pCO2 of 31, pO2 114, bicarb 29.7, CO2 is 30.7, saturation is 100%.  White count 11,600, hemoglobin 9.5, hematocrit 29.9, platelet count 302,000.  Troponin 1.45, then 2.8.  ASSESSMENT: 1. Acute respiratory failure secondary to pulmonary edema. 2. Non-ST elevation myocardial infarction. a. Rule out stent thrombosis. b. New wall motion abnormality inferior inferoseptal wall with LV dysfunction, ejection fraction 35-40%, previous ejection fraction 55%. 3. Low-grade fever, questionable secondary to myocardial infarction. 4. Ventilator-dependent respiratory failure secondary to pulmonary edema. 5. Respiratory alkalosis. 6. Coronary artery disease, status post recent stent placement with a drug-eluting platform to the circumflex and the diagonal. a. The patient is allergic to PLAVIX with rash. b. The patient never picked up Brilinta, which was called in for Plavix.  PLAN: 1. I suspect that the patient has stent thrombosis, although her wall motion abnormalities by echo are in a slightly different territory of her prior stent placement. 2. The patient was dialyzed yesterday, and she does not appear to be volume overloaded anymore. 3. The patient is currently on heparin, and we will give the patient 180 mg of Brilinta, followed by Brilinta 90 mg p.o. b.i.d. 4. I have made adjustments to the ventilator, and  we will put the patient on IMV of 6 or 8, PEEP of 5, pressure support of 8 to 10 cm of water and discontinue propofol.  The patient can be intermittently sedated with Versed.  I do anticipate that the patient can be extubated; although if we proceed with a catheterization today, it might be preferable to leave the patient intubated. 5. I spoke with Dr. Lia Foyer and notified him that the patient will be transferred.  He suggested that we discharge the patient first to decide on the timing of the diagnostic cardiac catheterization. 6. [facility] will be called, and the patient will be transferred as soon as possible.   __________________________    Terald Sleeper, M.D. Dairl Ponder D: 01/09/2012 1114 T: 01/09/2012 1151 P: DEG  Al Decant Gent 12:46 PM 01/09/2012

## 2012-01-09 NOTE — Consult Note (Addendum)
Name: Shawna [name] MRN: [id number] DOB: [birth year]    LOS: 0 Date of admit 01/09/2012  2:25 PM   Referring Provider:  Velora Heckler cardiology Dr Salli Real and Dr Warren Danes Reason for Referral:  Acute Respiratory Failure  PULMONARY / CRITICAL CARE MEDICINE HPI: : The patient is a 71 year old female ESRD on HD, hx of ovarian and colon cancer, hx of gi bleed nos who was, a few weeks ago, admitted to [facility] after she ruled in for non-ST elevation myocardial infarction. wa s/p cath  found to have triple vessel CAD with severe circumflex disease and diagonal branch disease and moderate LAD disease. On 6/6/613 she underwent successful DES of the circumflex and diagonal artery with no noted complications. Ejection fraction was 55%. No significant mitral regurgitation. The patient was placed on aspirin and Plavix; however, she developed a rash secondary to Plavix. Brilinta was subsituted but she never picked it up apparently.      Subsequently on 01/08/2012 admitted to [facility] after complaining of chest tightness and acute paroxsymal nocturnal dyspnea and admitted. And reportedly sometime  Before or after dialysis on 12/29/11 devloped flasth pulmonary edema and was intubated. Then on 01/09/12 developed positive troponin to 2.8. Cards have admitted now to [facility] concern of   Stent thrombosis v new area of MI based on echo findings of  new wall motion abnormality inferior inferoseptal wall with LV dysfunction, ejection fraction 35-40%, previous ejection fraction 55%.    .The patient is currently sedated on propofol but hemodynamically stable, albeit with a sinus tachycardia. Saturations 99%, and she is on minimal ventilator settings with a peak airway pressure of 20 cm of water and PEEP of 5 cm of water. She is on 1 to 3.8 I:E ratio with a tidal volume of 500, peak flow of 60 with a ramp waveform. She is also on propofol for sedation.   Course at [facility] also complicated by  Low-grade  fever, questionable secondary to myocardial infarction.   Plan is for cardiac cath 01/09/2012 on ventilator   Past Medical History  Diagnosis Date  . ESRD (end stage renal disease) on dialysis 12/15/11    [place]; Mon; Wed; Fri  . Hypertension   . CHF (congestive heart failure)   . Anginal pain   . Chronic bronchitis 12/15/11    "have had it last 4 wk"  . Pneumonia ~ 2010  . Anemia   . History of blood transfusion 07/2011    "first time"  . GERD (gastroesophageal reflux disease)   . History of stomach ulcers   . History of lower GI bleeding   . Arthritis     "hands and feet"  . History of gout   . Anxiety   . Ovarian cancer 1992  . Colon cancer   . CAD (coronary artery disease)     a. 12/2011 s/p DES to LCX and Diag   Past Surgical History  Procedure Date  . Coronary angioplasty with stent placement 12/15/11    "2"  . Abdominal hysterectomy 1992  . Appendectomy 06/1990  . Tubal ligation 1980's  . Av fistula placement 07/2009    left upper arm  . Thrombectomy / arteriovenous graft revision     left upper arm  . Colon resection 1992   Prior to Admission medications   Medication Sig Start Date End Date Taking? Authorizing Provider  allopurinol (ZYLOPRIM) 300 MG tablet Take 300 mg by mouth daily.    Historical Provider, MD  ALPRAZolam (  XANAX) 0.25 MG tablet Take 0.25-0.5 mg by mouth at bedtime.    Historical Provider, MD  amLODipine (NORVASC) 10 MG tablet Take 10 mg by mouth daily.    Historical Provider, MD  aspirin EC 81 MG EC tablet Take 1 tablet (81 mg total) by mouth daily. 12/16/11 12/15/12  Rogelia Mire, NP  atorvastatin (LIPITOR) 20 MG tablet Take 1 tablet (20 mg total) by mouth daily at 6 PM. 12/16/11 12/15/12  Rogelia Mire, NP  cloNIDine (CATAPRES) 0.2 MG tablet Take 0.2 mg by mouth 2 (two) times daily.    Historical Provider, MD  fluconazole (DIFLUCAN) 100 MG tablet Take 100 mg by mouth daily.    Historical Provider, MD  folic acid-vitamin b complex-vitamin  c-selenium-zinc (DIALYVITE) 3 MG TABS Take 1 tablet by mouth daily.    Historical Provider, MD  iron polysaccharides (NIFEREX) 150 MG capsule Take 150 mg by mouth 2 (two) times daily.    Historical Provider, MD  labetalol (NORMODYNE) 200 MG tablet Take 200 mg by mouth 2 (two) times daily. 12/16/11   Rogelia Mire, NP  lanthanum (FOSRENOL) 500 MG chewable tablet Chew 1,000 mg by mouth 3 (three) times daily with meals.    Historical Provider, MD  Lidocaine HCl 2.5 % KIT Apply topically.    Historical Provider, MD  loratadine (CLARITIN) 10 MG tablet Take 10 mg by mouth daily as needed. For allergies.    Historical Provider, MD  nitroGLYCERIN (NITROSTAT) 0.4 MG SL tablet Place 1 tablet (0.4 mg total) under the tongue every 5 (five) minutes x 3 doses as needed for chest pain. 12/16/11 12/15/12  Rogelia Mire, NP  pantoprazole (PROTONIX) 40 MG tablet Take 1 tablet (40 mg total) by mouth daily. 12/16/11 12/15/12  Rogelia Mire, NP  Ticagrelor (BRILINTA) 90 MG TABS tablet Take 1 tablet (90 mg total) by mouth 2 (two) times daily. *D/C plavix* 01/05/12   Aurora Mask, PA   Allergies Allergies  Allergen Reactions  . Aspirin Other (See Comments)    Mess up her stomach; "makes my bowels have blood in them"  . Contrast Media (Iodinated Diagnostic Agents) Itching  . Macrodantin (Nitrofurantoin Macrocrystal) Other (See Comments)    "broke me out in big old knots all over my body; had to go to ER"  . Penicillins Other (See Comments)    "makes me real weak when I take it; like I'll pass out"  . Bactrim (Sulfamethoxazole W-Trimethoprim) Rash  . Sulfa Antibiotics Rash  . Plavix (Clopidogrel Bisulfate) Rash    Family History No family history on file. Social History  reports that she has never smoked. She has never used smokeless tobacco. She reports that she does not drink alcohol or use illicit drugs.  Review Of Systems:  Per HPI  Brief patient description:  See HPI  Events Since  Admission: Awaiting cath  Current Status: On vent, critically ill,. bp high  Vital Signs: Temp:  [100.1 F (37.8 C)] 100.1 F (37.8 C) (07/01 1446) Pulse Rate:  [90] 90  (07/01 1446) Resp:  [15] 15  (07/01 1446) BP: (160)/(89) 160/89 mmHg (07/01 1446) FiO2 (%):  [40 %] 40 % (07/01 1454) Weight:  [65.5 kg (144 lb 6.4 oz)] 65.5 kg (144 lb 6.4 oz) (07/01 1454)  Physical Examination: General:  On vent. Looks critically ill Neuro:  RASS -2-/-3 on diprivan HEENT:  ET tube + Neck:  Supple, No nodes Cardiovascular:  Hypertensive, Normocaridc. S1S2+. No murmur Lungs:  CTA bilaterally. On vent  Abdomen:  Soft, no mass. Normal bowel sounds Musculoskeletal:  No edema, no cyanosis, no clubbing Skin:  Appears intact    [facility] WORK: Blood gas: PH 7.59, pCO2 of 31, pO2 114, bicarb 29.7, CO2 is 30.7, saturation is 100%. White count 11,600, hemoglobin 9.5, hematocrit 29.9, platelet count 302,000. Troponin 1.45, then 2.8.     Principal Problem:  *CAD (coronary artery disease) Active Problems:  Acute systolic CHF (congestive heart failure)  Acute respiratory failure  Acute MI  ESRD (end stage renal disease)  HTN (hypertension)  Hyperlipidemia   ASSESSMENT AND PLAN  PULMONARY No results found for this basename: PHART:5,PCO2:5,PCO2ART:5,PO2ART:5,HCO3:5,O2SAT:5 in the last 168 hours Ventilator Settings: Vent Mode:  [-]  FiO2 (%):  [40 %] 40 % CXR:  Reportedly acute pulm edema at [facility] ETT:  01/08/12 at [facility] v 01/09/12 at [facility] >>  A:  Acute REsp failure due to Acute Pulmonary edema P:   Full vent support to continue priior to cath Check cxr and abg Oral care protocol  CARDIOVASCULAR No results found for this basename: TROPONINI:5,LATICACIDVEN:5, O2SATVEN:5,PROBNP:5 in the last 168 hours ECG:   EKG showed left ventricular hypertrophy and nonspecific ST-T wave changes, although there is some ST-segment depression inferior leads.   Lines: PIV  A: ACute MI -  stent thrombosis v fresh MI Acute Pulmnary edema  P:  To cath lab on vent - timing based on cards input Meds per [facility] cards Place CVL if needed  RENAL No results found for this basename: NA:5,K:2,CL:5,CO2:5,BUN:5,CREATININE:5,CALCIUM:5,MG:5,PHOS:5 in the last 168 hours Intake/Output    None    Foley:  ?  A:  ESRD P:   Renal consult  GASTROINTESTINAL No results found for this basename: AST:5,ALT:5,ALKPHOS:5,BILITOT:5,PROT:5,ALBUMIN:5 in the last 168 hours  A:  Nil acute P:   Check LFT  HEMATOLOGIC No results found for this basename: HGB:5,HCT:5,PLT:5,INR:5,APTT:5 in the last 168 hours A:  Nil acute but at risk for anemia of critical illness P:  - PRBC for hgb </= 6.9gm%    - exceptions are   -  if ACS susepcted/confirmed then transfuse for hgb </= 8.0gm%,  or    -  If septic shock first 24h and scvo2 < 70% then transfuse for hgb </= 9.0gm%   - active bleeding with hemodynamic instability, then transfuse regardless of hemoglobin value   At at all times try to transfuse 1 unit prbc as possible with exception of active hemorrhage    INFECTIOUS No results found for this basename: WBC:5,PROCALCITON:5 in the last 168 hours Cultures: none Antibiotics: none  A:  No evidence of infection P:   monitor  ENDOCRINE No results found for this basename: GLUCAP:5 in the last 168 hours A:  none   P:   icu hyoperglycemia  NEUROLOGIC  A:  Reportedly normal mental statu. Currently RASS -3 on diprivan P:   Continue diprivan for now Fentanyl prn  BEST PRACTICE / DISPOSITION Level of Care:  ICU Primary Service:  cards Consultants:  pccm Code Status:  full Diet:  npo DVT Px:  Therapeutic anticog for mi GI Px:  ppi Skin Integrity:  intact Social / Family:  Unknown   The patient is critically ill with multiple organ systems failure and requires high complexity decision making for assessment and support, frequent evaluation and titration of therapies, application of  advanced monitoring technologies and extensive interpretation of multiple databases.   Critical Care Time devoted to patient care services described in this note is  60  Minutes.  Dr.  Brand Males, M.D., F.C.C.P Pulmonary and Critical Care Medicine Staff Physician [facility] Pager: [phone], If no answer or between  15:00h - 7:00h: call [phone]  01/09/2012 3:20 PM

## 2012-01-09 NOTE — Progress Notes (Signed)
Please see H&P from Dr. Dannielle Burn earlier today. Patient seen on arrival to 2100.  Discussed with Dr. Chase Caller.  CCM will handle her ventilator issues.  Question of instent restenosis or occlusion raised by Dr. Dannielle Burn. Dr. Lia Foyer will be seeing patient today and decide re cath.  EKG here shows no change from 01/05/12. Chest xray show pulmonary vascular congestion. Patient has ESRD. Will ask nephrology to follow with [place].

## 2012-01-09 NOTE — Progress Notes (Signed)
Patient seen and examined.  Case discussed with family.  She is currently stable, intubated, and and ECG is unchanged as noted.  Her CPKMB is normal, Trop is pos  (nonspec in ESRD)  Her husband said she only missed her medication by one and possibly two days. They also describe a true contrast allergy. Favor treating allergy, dialysis early tomorrow, and then followed by repeat cath study.    Discussed with patient and family.

## 2012-01-09 NOTE — Progress Notes (Signed)
ANTICOAGULATION CONSULT NOTE - Initial Consult  Pharmacy Consult for Heparin Indication: ACS/ STEMI vs in-stent restenosis/thrombosis  Allergies  Allergen Reactions  . Aspirin Other (See Comments)    Mess up her stomach; "makes my bowels have blood in them"  . Contrast Media (Iodinated Diagnostic Agents) Itching  . Macrodantin (Nitrofurantoin Macrocrystal) Other (See Comments)    "broke me out in big old knots all over my body; had to go to ER"  . Penicillins Other (See Comments)    "makes me real weak when I take it; like I'll pass out"  . Bactrim (Sulfamethoxazole W-Trimethoprim) Rash  . Sulfa Antibiotics Rash  . Plavix (Clopidogrel Bisulfate) Rash    Patient Measurements: Height: 5\' 2"  (157.5 cm) Weight: 144 lb 6.4 oz (65.5 kg) IBW/kg (Calculated) : 50.1  Heparin Dosing Weight: 65 kg  Vital Signs: Temp: 100.1 F (37.8 C) (07/01 2000) Temp src: Oral (07/01 2000) BP: 133/69 mmHg (07/01 2200) Pulse Rate: 95  (07/01 2200)  Labs:  Basename 01/09/12 2130 01/09/12 2126 01/09/12 1528 01/09/12 1517  HGB -- -- -- 9.6*  HCT -- -- -- 30.1*  PLT -- -- -- 325  APTT -- -- -- --  LABPROT -- -- -- --  INR -- -- -- --  HEPARINUNFRC 0.78* -- -- --  CREATININE -- -- -- 7.94*  CKTOTAL -- 129 149 --  CKMB -- 2.8 2.7 --  TROPONINI -- 1.44* 1.56* --    Estimated Creatinine Clearance: 5.8 ml/min (by C-G formula based on Cr of 7.94). - ESRD   Medical History: Past Medical History  Diagnosis Date  . ESRD (end stage renal disease) on dialysis 12/15/11    [place]; Mon; Wed; Fri  . Hypertension   . CHF (congestive heart failure)   . Anginal pain   . Chronic bronchitis 12/15/11    "have had it last 4 wk"  . Pneumonia ~ 2010  . Anemia   . History of blood transfusion 07/2011    "first time"  . GERD (gastroesophageal reflux disease)   . History of stomach ulcers   . History of lower GI bleeding   . Arthritis     "hands and feet"  . History of gout   . Anxiety   . Ovarian cancer 1992   . Colon cancer   . CAD (coronary artery disease)     a. 12/2011 s/p DES to LCX and Diag   Assessment:   Hx NSTEMI and PCI 12/15/11.  Switched from Plavix to [place] on 01/05/12 due to rash, but just started this am.   Transferred from [facility] to [facility] earlier today.  Heparin was begun at [facility] ~3:30am today with 3000 units IV bolus, then infusion at 800 units/hr.  Heparin level was 0.57 ~10am at [facility]. Therapeutic. Heparin level up to 0.78 tonight on same rate. Planning cardiac cath 7/2 after dialysis.  Goal of Therapy:  Heparin level 0.3-0.7 units/ml Monitor platelets by anticoagulation protocol: Yes   Plan:    Decrease heparin drip to 700 units/hr.   Daily heparin level and CBC while on heparin.    Claudie Fisherman Pager: [phone] 01/09/2012,11:04 PM

## 2012-01-10 ENCOUNTER — Inpatient Hospital Stay (HOSPITAL_COMMUNITY): Payer: Medicare Other

## 2012-01-10 ENCOUNTER — Encounter (HOSPITAL_COMMUNITY)
Admission: EM | Disposition: A | Discharge status: Home Health | Payer: Self-pay | Source: Other Acute Inpatient Hospital | Attending: Cardiology

## 2012-01-10 DIAGNOSIS — J40 Bronchitis, not specified as acute or chronic: Secondary | ICD-10-CM

## 2012-01-10 DIAGNOSIS — I2510 Atherosclerotic heart disease of native coronary artery without angina pectoris: Secondary | ICD-10-CM

## 2012-01-10 DIAGNOSIS — I5021 Acute systolic (congestive) heart failure: Secondary | ICD-10-CM

## 2012-01-10 HISTORY — PX: LEFT HEART CATHETERIZATION WITH CORONARY ANGIOGRAM: SHX5451

## 2012-01-10 LAB — CBC
HCT: 27.6 % — ABNORMAL LOW (ref 36.0–46.0)
Hemoglobin: 8.7 g/dL — ABNORMAL LOW (ref 12.0–15.0)
Hemoglobin: 8.4 g/dL — ABNORMAL LOW (ref 12.0–15.0)
MCV: 91.1 fL (ref 78.0–100.0)
RBC: 3.03 MIL/uL — ABNORMAL LOW (ref 3.87–5.11)
RBC: 2.88 MIL/uL — ABNORMAL LOW (ref 3.87–5.11)
RDW: 17.3 % — ABNORMAL HIGH (ref 11.5–15.5)
WBC: 13.4 10*3/uL — ABNORMAL HIGH (ref 4.0–10.5)
WBC: 12.4 10*3/uL — ABNORMAL HIGH (ref 4.0–10.5)

## 2012-01-10 LAB — POCT ACTIVATED CLOTTING TIME
Activated Clotting Time: >1000 s
Activated Clotting Time: >1000 s

## 2012-01-10 LAB — POCT I-STAT 3, VENOUS BLOOD GAS (G3P V)
Acid-Base Excess: 1 mmol/L (ref 0.0–2.0)
Bicarbonate: 26 meq/L — ABNORMAL HIGH (ref 20.0–24.0)
O2 Saturation: 81 %
TCO2: 27 mmol/L (ref 0–100)
pCO2, Ven: 42.6 mmHg — ABNORMAL LOW (ref 45.0–50.0)
pH, Ven: 7.393 — ABNORMAL HIGH (ref 7.250–7.300)
pO2, Ven: 45 mmHg (ref 30.0–45.0)

## 2012-01-10 LAB — POCT I-STAT, CHEM 8
BUN: 24 mg/dL — ABNORMAL HIGH (ref 6–23)
Calcium, Ion: 1.05 mmol/L — ABNORMAL LOW (ref 1.12–1.32)
Chloride: 100 meq/L (ref 96–112)
Creatinine, Ser: 5.1 mg/dL — ABNORMAL HIGH (ref 0.50–1.10)
Glucose, Bld: 168 mg/dL — ABNORMAL HIGH (ref 70–99)
HCT: 31 % — ABNORMAL LOW (ref 36.0–46.0)
Hemoglobin: 10.5 g/dL — ABNORMAL LOW (ref 12.0–15.0)
Potassium: 3.9 meq/L (ref 3.5–5.1)
Sodium: 138 meq/L (ref 135–145)
TCO2: 26 mmol/L (ref 0–100)

## 2012-01-10 LAB — POCT I-STAT 3, ART BLOOD GAS (G3+)
Acid-Base Excess: 5 mmol/L — ABNORMAL HIGH (ref 0.0–2.0)
Bicarbonate: 29.2 meq/L — ABNORMAL HIGH (ref 20.0–24.0)
O2 Saturation: 99 %
O2 Saturation: 100 %
TCO2: 30 mmol/L (ref 0–100)
pCO2 arterial: 39 mmHg (ref 35.0–45.0)
pCO2 arterial: 41.2 mmHg (ref 35.0–45.0)
pH, Arterial: 7.495 — ABNORMAL HIGH (ref 7.350–7.400)
pH, Arterial: 7.459 — ABNORMAL HIGH (ref 7.350–7.400)
pO2, Arterial: 149 mmHg — ABNORMAL HIGH (ref 80.0–100.0)
pO2, Arterial: 521 mmHg — ABNORMAL HIGH (ref 80.0–100.0)

## 2012-01-10 LAB — CARDIAC PANEL(CRET KIN+CKTOT+MB+TROPI)
CK, MB: 3.3 ng/mL (ref 0.3–4.0)
Relative Index: 2.7 — ABNORMAL HIGH (ref 0.0–2.5)
Total CK: 122 U/L (ref 7–177)

## 2012-01-10 LAB — GLUCOSE, CAPILLARY
Glucose-Capillary: 138 mg/dL — ABNORMAL HIGH (ref 70–99)
Glucose-Capillary: 115 mg/dL — ABNORMAL HIGH (ref 70–99)

## 2012-01-10 LAB — PROTIME-INR: INR: 1.24 (ref 0.00–1.49)

## 2012-01-10 LAB — BASIC METABOLIC PANEL
BUN: 56 mg/dL — ABNORMAL HIGH (ref 6–23)
CO2: 24 mEq/L (ref 19–32)
Chloride: 89 mEq/L — ABNORMAL LOW (ref 96–112)
Creatinine, Ser: 9.58 mg/dL — ABNORMAL HIGH (ref 0.50–1.10)
Glucose, Bld: 162 mg/dL — ABNORMAL HIGH (ref 70–99)

## 2012-01-10 LAB — HEPARIN LEVEL (UNFRACTIONATED): Heparin Unfractionated: 0.69 IU/mL (ref 0.30–0.70)

## 2012-01-10 IMAGING — CR DG CHEST 1V PORT
1 series · 1 of 1 positions shown · non-contrast
Comparison: [DATE].

CLINICAL DATA: Check endotracheal tube position.  Respiratory
failure.

PORTABLE CHEST - 1 VIEW

[AP]
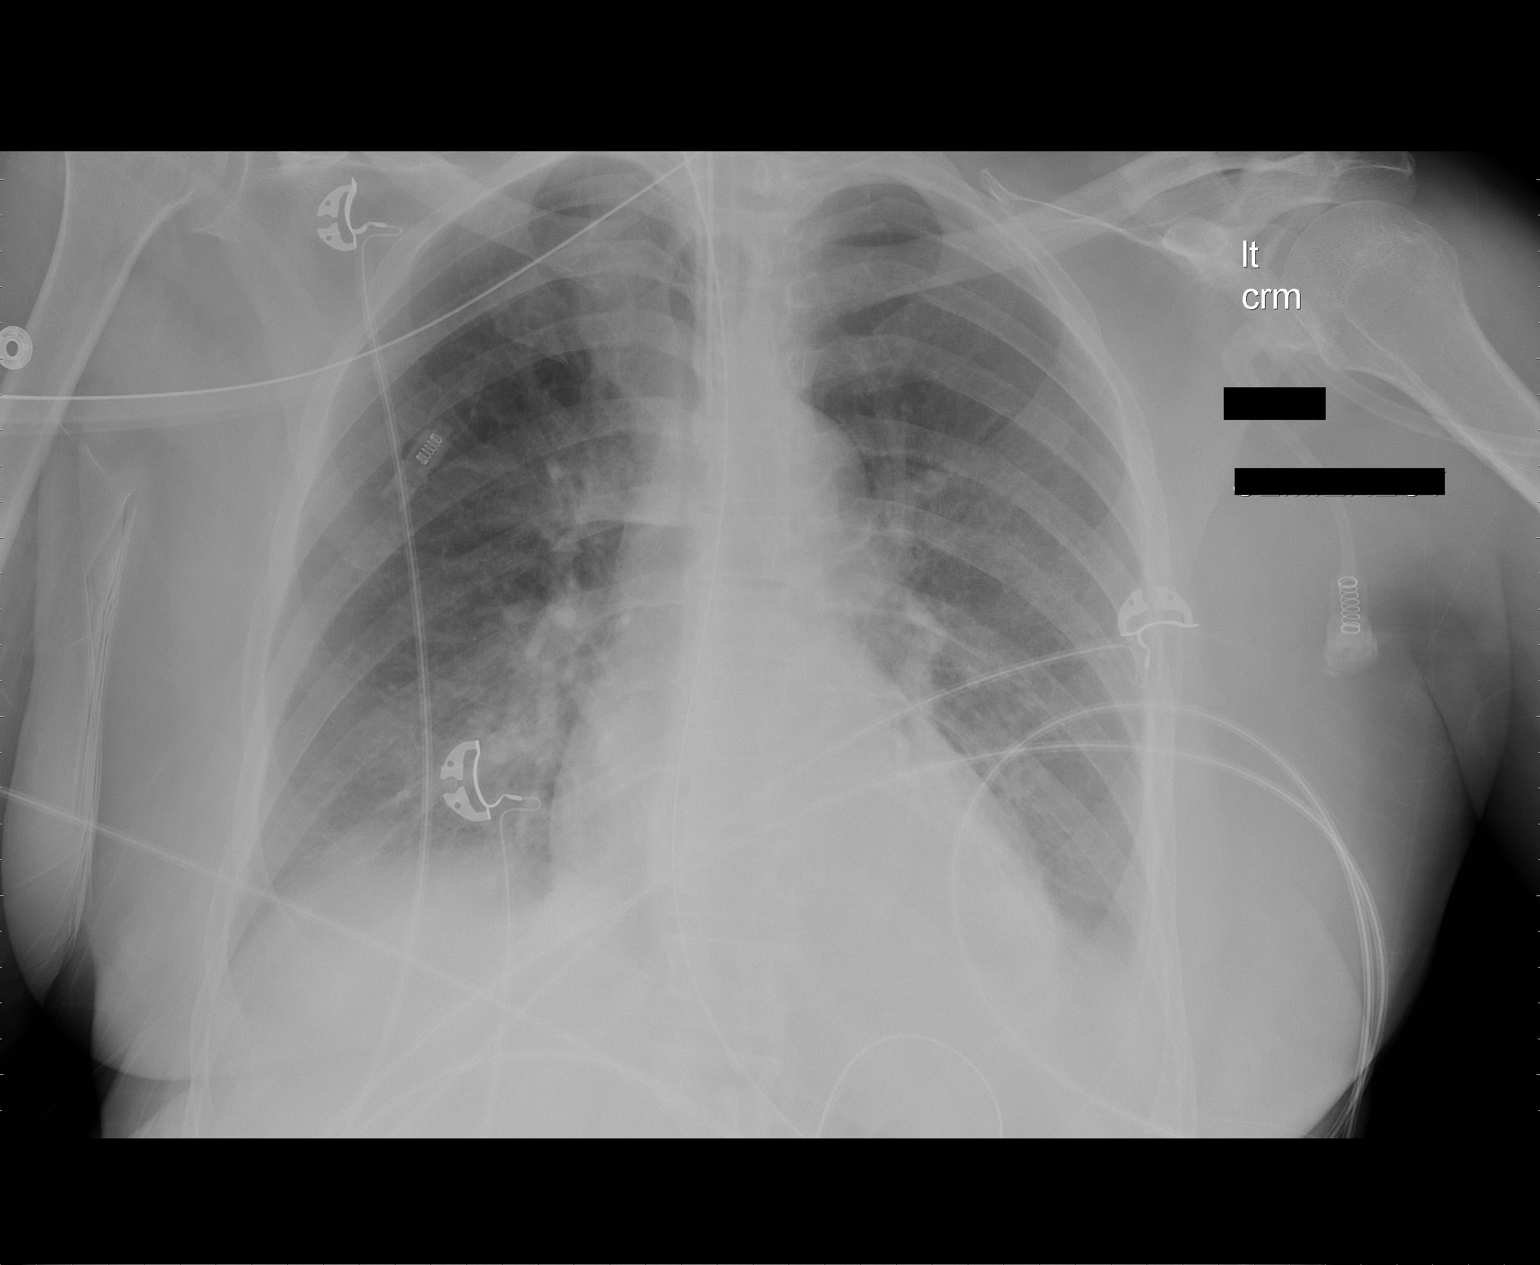

[1 of 1 positions shown; findings below may reference images not displayed]

FINDINGS: Endotracheal tube is present with the tip 33 mm from the
carina.  Enteric tube present with loop in the proximal stomach.
Cardiopericardial silhouette within normal limits for size.  Mild
perihilar and basilar airspace disease most compatible with
pulmonary edema.  Small bilateral pleural effusions.  Basilar
atelectasis.  Compared yesterday, no interval change.
IMPRESSION: No interval change.

## 2012-01-10 SURGERY — LEFT HEART CATHETERIZATION WITH CORONARY ANGIOGRAM
Anesthesia: LOCAL

## 2012-01-10 MED ORDER — MIDAZOLAM HCL 2 MG/2ML IJ SOLN
1.0000 mg | INTRAMUSCULAR | Status: DC | PRN
  Administered 2012-01-10 (×2): 2 mg via INTRAVENOUS
  Filled 2012-01-10 (×2): qty 2

## 2012-01-10 MED ORDER — SODIUM CHLORIDE 0.9 % IV SOLN
INTRAVENOUS | Status: DC
  Administered 2012-01-11: 22:00:00 via INTRAVENOUS
  Administered 2012-01-11: 10 mL/h via INTRAVENOUS

## 2012-01-10 MED ORDER — ALBUMIN HUMAN 25 % IV SOLN
12.5000 g | Freq: Once | INTRAVENOUS | Status: AC
  Administered 2012-01-11: 12.5 g via INTRAVENOUS
  Filled 2012-01-10: qty 50

## 2012-01-10 MED ORDER — MIDAZOLAM HCL 2 MG/2ML IJ SOLN
INTRAMUSCULAR | Status: AC
  Filled 2012-01-10: qty 2

## 2012-01-10 MED ORDER — FENTANYL BOLUS VIA INFUSION
50.0000 ug | Freq: Four times a day (QID) | INTRAVENOUS | Status: DC | PRN
  Filled 2012-01-10: qty 100

## 2012-01-10 MED ORDER — HEPARIN (PORCINE) IN NACL 2-0.9 UNIT/ML-% IJ SOLN
INTRAMUSCULAR | Status: AC
  Filled 2012-01-10: qty 2000

## 2012-01-10 MED ORDER — BIVALIRUDIN 250 MG IV SOLR
INTRAVENOUS | Status: AC
  Filled 2012-01-10: qty 250

## 2012-01-10 MED ORDER — FENTANYL CITRATE 0.05 MG/ML IJ SOLN
INTRAMUSCULAR | Status: AC
  Filled 2012-01-10: qty 2

## 2012-01-10 MED ORDER — SODIUM CHLORIDE 0.9 % IJ SOLN: 3.0000 mL | INTRAMUSCULAR | Status: DC | PRN

## 2012-01-10 MED ORDER — SODIUM CHLORIDE 0.9 % IV SOLN: 250.0000 mL | INTRAVENOUS | Status: DC | PRN

## 2012-01-10 MED ORDER — LIDOCAINE HCL (PF) 1 % IJ SOLN
INTRAMUSCULAR | Status: AC
  Filled 2012-01-10: qty 30

## 2012-01-10 MED ORDER — SODIUM CHLORIDE 0.9 % IV SOLN
50.0000 ug/h | INTRAVENOUS | Status: DC
  Administered 2012-01-10 – 2012-01-11 (×2): 50 ug/h via INTRAVENOUS
  Filled 2012-01-10 (×2): qty 50

## 2012-01-10 MED ORDER — FENTANYL CITRATE 0.05 MG/ML IJ SOLN: 25.0000 ug | INTRAMUSCULAR | Status: DC | PRN

## 2012-01-10 MED ORDER — SODIUM CHLORIDE 0.9 % IJ SOLN
3.0000 mL | Freq: Two times a day (BID) | INTRAMUSCULAR | Status: DC
  Administered 2012-01-10: 3 mL via INTRAVENOUS

## 2012-01-10 MED ORDER — NITROGLYCERIN 0.2 MG/ML ON CALL CATH LAB
INTRAVENOUS | Status: AC
  Filled 2012-01-10: qty 1

## 2012-01-10 NOTE — Progress Notes (Signed)
Reason for Consult:ESRD/Volume overload Referring Physician: [name] is a 71 y.o. female ESRD patient who has known CAD with chest pain, shortness of breath and developed acute pulmonary edema at [facility] and was intubated and transferred to [facility] for cardiac catheterization. Dr Lia Foyer has asked nephrology to provide urgent dialysis in anticipation of the procedure. She Dialyzes at [facility] on MWF ,[employer]. EDW 64.5kg. HD Bath 2/2.5, Dialyzer ct190g, Heparin 1000u.Time180 min Access AVF lue.  Past Medical History  Diagnosis Date  . ESRD (end stage renal disease) on dialysis 12/15/11    [place]; Mon; Wed; Fri  . Hypertension   . CHF (congestive heart failure)   . Anginal pain   . Chronic bronchitis 12/15/11    "have had it last 4 wk"  . Pneumonia ~ 2010  . Anemia   . History of blood transfusion 07/2011    "first time"  . GERD (gastroesophageal reflux disease)   . History of stomach ulcers   . History of lower GI bleeding   . Arthritis     "hands and feet"  . History of gout   . Anxiety   . Ovarian cancer 1992  . Colon cancer   . CAD (coronary artery disease)     a. 12/2011 s/p DES to LCX and Diag    Past Surgical History  Procedure Date  . Coronary angioplasty with stent placement 12/15/11    "2"  . Abdominal hysterectomy 1992  . Appendectomy 06/1990  . Tubal ligation 1980's  . Av fistula placement 07/2009    left upper arm  . Thrombectomy / arteriovenous graft revision     left upper arm  . Colon resection 1992    History reviewed. No pertinent family history.  Social History:  reports that she has never smoked. She has never used smokeless tobacco. She reports that she does not drink alcohol or use illicit drugs.  Allergies:  Allergies  Allergen Reactions  . Aspirin Other (See Comments)    Mess up her stomach; "makes my bowels have blood in them"  . Contrast Media (Iodinated Diagnostic Agents) Itching  .  Macrodantin (Nitrofurantoin Macrocrystal) Other (See Comments)    "broke me out in big old knots all over my body; had to go to ER"  . Penicillins Other (See Comments)    "makes me real weak when I take it; like I'll pass out"  . Bactrim (Sulfamethoxazole W-Trimethoprim) Rash  . Sulfa Antibiotics Rash  . Plavix (Clopidogrel Bisulfate) Rash    Medications:  Prior to Admission:  Prescriptions prior to admission  Medication Sig Dispense Refill  . allopurinol (ZYLOPRIM) 300 MG tablet Take 300 mg by mouth daily.      Marland Kitchen ALPRAZolam (XANAX) 0.25 MG tablet Take 0.25 mg by mouth at bedtime as needed. For anxiety.      Marland Kitchen amLODipine (NORVASC) 10 MG tablet Take 10 mg by mouth daily.      Marland Kitchen aspirin EC 81 MG tablet Take 81 mg by mouth daily.      Marland Kitchen atorvastatin (LIPITOR) 20 MG tablet Take 20 mg by mouth daily.      . cloNIDine (CATAPRES) 0.2 MG tablet Take 0.2 mg by mouth 2 (two) times daily.      . fluconazole (DIFLUCAN) 100 MG tablet Take 100 mg by mouth daily.      . folic acid-vitamin b complex-vitamin c-selenium-zinc (DIALYVITE) 3 MG TABS Take 1 tablet by mouth daily.      Marland Kitchen  iron polysaccharides (NIFEREX) 150 MG capsule Take 150 mg by mouth 2 (two) times daily.      Marland Kitchen labetalol (NORMODYNE) 200 MG tablet Take 200 mg by mouth 2 (two) times daily.      Marland Kitchen lanthanum (FOSRENOL) 500 MG chewable tablet Chew 1,000 mg by mouth 3 (three) times daily with meals.      Marland Kitchen loratadine (CLARITIN) 10 MG tablet Take 10 mg by mouth daily as needed. For allergies.      . nitroGLYCERIN (NITROSTAT) 0.4 MG SL tablet Place 0.4 mg under the tongue every 5 (five) minutes as needed. For chest pain.      . pantoprazole (PROTONIX) 20 MG tablet Take 20 mg by mouth daily.      . Ticagrelor (BRILINTA) 90 MG TABS tablet Take 90 mg by mouth 2 (two) times daily.        Hectorol 3.67mcg,  Epogen 4400.   Results for orders placed during the hospital encounter of 01/09/12 (from the past 48 hour(s))  MRSA PCR SCREENING     Status:  Normal   Collection Time   01/09/12  2:37 PM      Component Value Range Comment   MRSA by PCR NEGATIVE  NEGATIVE   MAGNESIUM     Status: Abnormal   Collection Time   01/09/12  3:17 PM      Component Value Range Comment   Magnesium 1.4 (*) 1.5 - 2.5 mg/dL   PHOSPHORUS     Status: Normal   Collection Time   01/09/12  3:17 PM      Component Value Range Comment   Phosphorus 3.7  2.3 - 4.6 mg/dL   HEPATIC FUNCTION PANEL     Status: Abnormal   Collection Time   01/09/12  3:17 PM      Component Value Range Comment   Total Protein 6.2  6.0 - 8.3 g/dL    Albumin 3.1 (*) 3.5 - 5.2 g/dL    AST 32  0 - 37 U/L    ALT 14  0 - 35 U/L    Alkaline Phosphatase 76  39 - 117 U/L    Total Bilirubin 0.5  0.3 - 1.2 mg/dL    Bilirubin, Direct 0.2  0.0 - 0.3 mg/dL    Indirect Bilirubin 0.3  0.3 - 0.9 mg/dL   BASIC METABOLIC PANEL     Status: Abnormal   Collection Time   01/09/12  3:17 PM      Component Value Range Comment   Sodium 136  135 - 145 mEq/L    Potassium 4.2  3.5 - 5.1 mEq/L    Chloride 93 (*) 96 - 112 mEq/L    CO2 25  19 - 32 mEq/L    Glucose, Bld 120 (*) 70 - 99 mg/dL    BUN 40 (*) 6 - 23 mg/dL    Creatinine, Ser 7.94 (*) 0.50 - 1.10 mg/dL    Calcium 8.7  8.4 - 10.5 mg/dL    GFR calc non Af Amer 5 (*) >90 mL/min    GFR calc Af Amer 5 (*) >90 mL/min   LACTIC ACID, PLASMA     Status: Normal   Collection Time   01/09/12  3:17 PM      Component Value Range Comment   Lactic Acid, Venous 1.7  0.5 - 2.2 mmol/L   CBC     Status: Abnormal   Collection Time   01/09/12  3:17 PM      Component Value  Range Comment   WBC 13.5 (*) 4.0 - 10.5 K/uL    RBC 3.26 (*) 3.87 - 5.11 MIL/uL    Hemoglobin 9.6 (*) 12.0 - 15.0 g/dL    HCT 30.1 (*) 36.0 - 46.0 %    MCV 92.3  78.0 - 100.0 fL    MCH 29.4  26.0 - 34.0 pg    MCHC 31.9  30.0 - 36.0 g/dL    RDW 17.4 (*) 11.5 - 15.5 %    Platelets 325  150 - 400 K/uL   CARDIAC PANEL(CRET KIN+CKTOT+MB+TROPI)     Status: Abnormal   Collection Time   01/09/12  3:28 PM       Component Value Range Comment   Total CK 149  7 - 177 U/L    CK, MB 2.7  0.3 - 4.0 ng/mL    Troponin I 1.56 (*) <0.30 ng/mL    Relative Index 1.8  0.0 - 2.5   PRO B NATRIURETIC PEPTIDE     Status: Abnormal   Collection Time   01/09/12  3:28 PM      Component Value Range Comment   Pro B Natriuretic peptide (BNP) 9339.0 (*) 0 - 125 pg/mL   GLUCOSE, CAPILLARY     Status: Abnormal   Collection Time   01/09/12  4:00 PM      Component Value Range Comment   Glucose-Capillary 100 (*) 70 - 99 mg/dL   POCT I-STAT 3, BLOOD GAS (G3+)     Status: Abnormal   Collection Time   01/09/12  4:28 PM      Component Value Range Comment   pH, Arterial 7.528 (*) 7.350 - 7.400    pCO2 arterial 34.2 (*) 35.0 - 45.0 mmHg    pO2, Arterial 131.0 (*) 80.0 - 100.0 mmHg    Bicarbonate 28.3 (*) 20.0 - 24.0 mEq/L    TCO2 29  0 - 100 mmol/L    O2 Saturation 99.0      Acid-Base Excess 6.0 (*) 0.0 - 2.0 mmol/L    Patient temperature 100.2 F      Collection site RADIAL, ALLEN'S TEST ACCEPTABLE      Drawn by RT      Sample type ARTERIAL     GLUCOSE, CAPILLARY     Status: Abnormal   Collection Time   01/09/12  8:15 PM      Component Value Range Comment   Glucose-Capillary 122 (*) 70 - 99 mg/dL   CARDIAC PANEL(CRET KIN+CKTOT+MB+TROPI)     Status: Abnormal   Collection Time   01/09/12  9:26 PM      Component Value Range Comment   Total CK 129  7 - 177 U/L    CK, MB 2.8  0.3 - 4.0 ng/mL    Troponin I 1.44 (*) <0.30 ng/mL    Relative Index 2.2  0.0 - 2.5   HEPARIN LEVEL (UNFRACTIONATED)     Status: Abnormal   Collection Time   01/09/12  9:30 PM      Component Value Range Comment   Heparin Unfractionated 0.78 (*) 0.30 - 0.70 IU/mL   GLUCOSE, CAPILLARY     Status: Abnormal   Collection Time   01/10/12 12:15 AM      Component Value Range Comment   Glucose-Capillary 157 (*) 70 - 99 mg/dL    Comment 1 Documented in Chart      Comment 2 Notify RN     GLUCOSE, CAPILLARY     Status: Abnormal   Collection  Time   01/10/12  4:13  AM      Component Value Range Comment   Glucose-Capillary 143 (*) 70 - 99 mg/dL    Comment 1 Documented in Chart      Comment 2 Notify RN     CBC     Status: Abnormal   Collection Time   01/10/12  4:30 AM      Component Value Range Comment   WBC 13.4 (*) 4.0 - 10.5 K/uL    RBC 3.03 (*) 3.87 - 5.11 MIL/uL    Hemoglobin 8.7 (*) 12.0 - 15.0 g/dL    HCT 27.6 (*) 36.0 - 46.0 %    MCV 91.1  78.0 - 100.0 fL    MCH 28.7  26.0 - 34.0 pg    MCHC 31.5  30.0 - 36.0 g/dL    RDW 17.3 (*) 11.5 - 15.5 %    Platelets 288  150 - 400 K/uL   BASIC METABOLIC PANEL     Status: Abnormal   Collection Time   01/10/12  4:30 AM      Component Value Range Comment   Sodium 133 (*) 135 - 145 mEq/L    Potassium 3.8  3.5 - 5.1 mEq/L    Chloride 89 (*) 96 - 112 mEq/L    CO2 24  19 - 32 mEq/L    Glucose, Bld 162 (*) 70 - 99 mg/dL    BUN 56 (*) 6 - 23 mg/dL    Creatinine, Ser 9.58 (*) 0.50 - 1.10 mg/dL    Calcium 8.6  8.4 - 10.5 mg/dL    GFR calc non Af Amer 4 (*) >90 mL/min    GFR calc Af Amer 4 (*) >90 mL/min   HEPARIN LEVEL (UNFRACTIONATED)     Status: Normal   Collection Time   01/10/12  4:30 AM      Component Value Range Comment   Heparin Unfractionated 0.69  0.30 - 0.70 IU/mL   PROTIME-INR     Status: Abnormal   Collection Time   01/10/12  4:30 AM      Component Value Range Comment   Prothrombin Time 15.9 (*) 11.6 - 15.2 seconds    INR 1.24  0.00 - 1.49   CARDIAC PANEL(CRET KIN+CKTOT+MB+TROPI)     Status: Abnormal   Collection Time   01/10/12  6:39 AM      Component Value Range Comment   Total CK 122  7 - 177 U/L    CK, MB 3.3  0.3 - 4.0 ng/mL    Troponin I 0.79 (*) <0.30 ng/mL    Relative Index 2.7 (*) 0.0 - 2.5   GLUCOSE, CAPILLARY     Status: Abnormal   Collection Time   01/10/12  8:26 AM      Component Value Range Comment   Glucose-Capillary 138 (*) 70 - 99 mg/dL   GLUCOSE, CAPILLARY     Status: Abnormal   Collection Time   01/10/12 11:04 AM      Component Value Range Comment   Glucose-Capillary  116 (*) 70 - 99 mg/dL    Comment 1 Documented in Chart      Comment 2 Notify RN     POCT I-STAT 3, BLOOD GAS (G3+)     Status: Abnormal   Collection Time   01/10/12 12:32 PM      Component Value Range Comment   pH, Arterial 7.495 (*) 7.350 - 7.400    pCO2 arterial 39.0  35.0 - 45.0 mmHg  pO2, Arterial 149.0 (*) 80.0 - 100.0 mmHg    Bicarbonate 30.1 (*) 20.0 - 24.0 mEq/L    TCO2 31  0 - 100 mmol/L    O2 Saturation 99.0      Acid-Base Excess 6.0 (*) 0.0 - 2.0 mmol/L    Patient temperature 98.5 F      Collection site RADIAL, ALLEN'S TEST ACCEPTABLE      Drawn by Operator      Sample type ARTERIAL       Dg Chest Port 1 View  01/10/2012  *RADIOLOGY REPORT*  Clinical Data: Check endotracheal tube position.  Respiratory failure.  PORTABLE CHEST - 1 VIEW  Comparison: 01/09/2012.  Findings: Endotracheal tube is present with the tip 33 mm from the carina.  Enteric tube present with loop in the proximal stomach. Cardiopericardial silhouette within normal limits for size.  Mild perihilar and basilar airspace disease most compatible with pulmonary edema.  Small bilateral pleural effusions.  Basilar atelectasis.  Compared yesterday, no interval change.  IMPRESSION: No interval change.  Original Report Authenticated By: Dereck Ligas, M.D.   Dg Chest Port 1v Same Day  01/09/2012  *RADIOLOGY REPORT*  Clinical Data: Acute respiratory failure.  PORTABLE CHEST - 1 VIEW SAME DAY  Comparison: 01/09/2012.  Findings: Endotracheal tube is in satisfactory position. Nasogastric tube is followed into the stomach.  Heart size stable. Mild diffuse interstitial prominence indistinctness with bibasilar air space disease.  Right perihilar consolidation.  Left pleural effusion.  IMPRESSION: Congestive heart failure with minimal interval improvement.  Original Report Authenticated By: Luretha Rued, M.D.    ROS: Not obtainable as pt intubated Blood pressure 105/69, pulse 92, temperature 98.5 F (36.9 C), temperature  source Oral, resp. rate 13, height 5\' 2"  (1.575 m), weight 62.2 kg (137 lb 2 oz), SpO2 100.00%. General appearance: mild distress Head: Normocephalic, without obvious abnormality, atraumatic Eyes: negative Ears: normal exterior  Nose: Nares normal. Septum midline. Mucosa normal. No drainage or sinus tenderness. Throat: ET tube in place Resp: clear to auscultation bilaterally Cardio: regular rate and rhythm GI: soft, non-tender; bowel sounds normal; no masses,  no organomegaly Extremities: edema 1+ Skin: Skin color, texture, turgor normal. No rashes or lesions Neurologic: Grossly normal  Assessment/Plan: 1 ESRD* 2 Respiratory Failure 3 CHF 4 ASCVD Plan Emergent hemodialysis  Shawna [name] C 01/10/2012, 3:50 PM

## 2012-01-10 NOTE — Progress Notes (Signed)
UR Completed.  Shawna [name] [id number] 01/10/2012

## 2012-01-10 NOTE — Procedures (Signed)
Difficulties with hemodialysis with hypotension while receiving sedation and analgesics and nitroglycerine.  Well tolerated with some medication adjustments Shawna [name]

## 2012-01-10 NOTE — Progress Notes (Signed)
Subjective:  Case discussed with Dr. Florene Glen.  She is a patient of Shawna, [name].  Stopped plavix due to rash, was delayed in starting Brilinta, and then developed chest pain, and then pulmonary edema requiring intubation.  Seen by Dr. Dannielle Burn and sent down here for care.  Enzymes essentially normal, and no def ECG changes.  They wanted her studied.  Tentative plan is for dialysis this am to be followed by cath.  I spoke with her family yesterday, and they were in agreement.  She is on propofol.    Objective:  Vital Signs in the last 24 hours: Temp:  [97.9 F (36.6 C)-100.2 F (37.9 C)] 98.1 F (36.7 C) (07/02 0414) Pulse Rate:  [81-102] 83  (07/02 0751) Resp:  [15-20] 19  (07/02 0751) BP: (85-192)/(47-104) 115/54 mmHg (07/02 0751) SpO2:  [99 %-100 %] 100 % (07/02 0751) FiO2 (%):  [39.7 %-40.3 %] 40.2 % (07/02 0752) Weight:  [144 lb 6.4 oz (65.5 kg)-147 lb 11.3 oz (67 kg)] 147 lb 11.3 oz (67 kg) (07/02 0600)  Intake/Output from previous day: 07/01 0701 - 07/02 0700 In: 67.6 [I.V.:67.6] Out: 0    Physical Exam: General: Well developed, well nourished, in no acute distress. Head:  Normocephalic and atraumatic. Lungs: Clear to auscultation and percussion. Heart: Normal S1 and S2.  No murmur, rubs or gallops.  Pulses: Pulses normal in all 4 extremities. Extremities: No clubbing or cyanosis. No edema. Neurologic: Alert and oriented x 3.    Lab Results:  Basename 01/10/12 0430 01/09/12 1517  WBC 13.4* 13.5*  HGB 8.7* 9.6*  PLT 288 325    Basename 01/10/12 0430 01/09/12 1517  NA 133* 136  K 3.8 4.2  CL 89* 93*  CO2 24 25  GLUCOSE 162* 120*  BUN 56* 40*  CREATININE 9.58* 7.94*    Basename 01/10/12 0639 01/09/12 2126  TROPONINI 0.79* 1.44*   Hepatic Function Panel  Basename 01/09/12 1517  PROT 6.2  ALBUMIN 3.1*  AST 32  ALT 14  ALKPHOS 76  BILITOT 0.5  BILIDIR 0.2  IBILI 0.3   No results found for this basename: CHOL in the last 72 hours No results found for  this basename: PROTIME in the last 72 hours  Imaging: Dg Chest Port 1 View  01/10/2012  *RADIOLOGY REPORT*  Clinical Data: Check endotracheal tube position.  Respiratory failure.  PORTABLE CHEST - 1 VIEW  Comparison: 01/09/2012.  Findings: Endotracheal tube is present with the tip 33 mm from the carina.  Enteric tube present with loop in the proximal stomach. Cardiopericardial silhouette within normal limits for size.  Mild perihilar and basilar airspace disease most compatible with pulmonary edema.  Small bilateral pleural effusions.  Basilar atelectasis.  Compared yesterday, no interval change.  IMPRESSION: No interval change.  Original Report Authenticated By: Dereck Ligas, M.D.   Dg Chest Port 1v Same Day  01/09/2012  *RADIOLOGY REPORT*  Clinical Data: Acute respiratory failure.  PORTABLE CHEST - 1 VIEW SAME DAY  Comparison: 01/09/2012.  Findings: Endotracheal tube is in satisfactory position. Nasogastric tube is followed into the stomach.  Heart size stable. Mild diffuse interstitial prominence indistinctness with bibasilar air space disease.  Right perihilar consolidation.  Left pleural effusion.  IMPRESSION: Congestive heart failure with minimal interval improvement.  Original Report Authenticated By: Luretha Rued, M.D.    Cardiac Studies:    Assessment/Plan:  Patient Active Hospital Problem List: CAD (coronary artery disease) (12/16/2011)   Assessment: Data to date does not suggest stent thrombosis.  Plan: given the scenario, suspect she should have quick cath on vent after dialysis, but would favor dialysis first given CXR and pulmonary edema.  Will go light on IV NTG. ESRD (end stage renal disease) (12/16/2011)   Assessment: discussed with Dr. Florene Glen   Plan: plan dialysis today.   Acute systolic CHF (congestive heart failure) (01/09/2012)   Assessment: prob needs dialysis first   Plan: defer extubation per CCM until dialysis and cath.  Will need to be seen after dialysis to make  decision, but she is on the board for possible cath.   Need to watch CBC carefully.   Heparin being adjusted down.           Bing Quarry, MD, [facility], [place] 01/10/2012, 8:10 AM

## 2012-01-10 NOTE — Progress Notes (Signed)
[place] Progress Note Patient Name: Shawna [name] DOB: 01-Dec-1940 MRN: [id number]  Date of Service  01/10/2012   HPI/Events of Note  Agitated.   eICU Interventions  Intermittent sedation sent.   Intervention Category Intermediate Interventions: Other:  Jachelle Fluty 01/10/2012, 8:23 PM

## 2012-01-10 NOTE — H&P (View-Only) (Signed)
Subjective:  Case discussed with Dr. Florene Glen.  She is a patient of [name], [name].  Stopped plavix due to rash, was delayed in starting Brilinta, and then developed chest pain, and then pulmonary edema requiring intubation.  Seen by Dr. Dannielle Burn and sent down here for care.  Enzymes essentially normal, and no def ECG changes.  They wanted her studied.  Tentative plan is for dialysis this am to be followed by cath.  I spoke with her family yesterday, and they were in agreement.  She is on propofol.    Objective:  Vital Signs in the last 24 hours: Temp:  [97.9 F (36.6 C)-100.2 F (37.9 C)] 98.1 F (36.7 C) (07/02 0414) Pulse Rate:  [81-102] 83  (07/02 0751) Resp:  [15-20] 19  (07/02 0751) BP: (85-192)/(47-104) 115/54 mmHg (07/02 0751) SpO2:  [99 %-100 %] 100 % (07/02 0751) FiO2 (%):  [39.7 %-40.3 %] 40.2 % (07/02 0752) Weight:  [144 lb 6.4 oz (65.5 kg)-147 lb 11.3 oz (67 kg)] 147 lb 11.3 oz (67 kg) (07/02 0600)  Intake/Output from previous day: 07/01 0701 - 07/02 0700 In: 67.6 [I.V.:67.6] Out: 0    Physical Exam: General: Well developed, well nourished, in no acute distress. Head:  Normocephalic and atraumatic. Lungs: Clear to auscultation and percussion. Heart: Normal S1 and S2.  No murmur, rubs or gallops.  Pulses: Pulses normal in all 4 extremities. Extremities: No clubbing or cyanosis. No edema. Neurologic: Alert and oriented x 3.    Lab Results:  Basename 01/10/12 0430 01/09/12 1517  WBC 13.4* 13.5*  HGB 8.7* 9.6*  PLT 288 325    Basename 01/10/12 0430 01/09/12 1517  NA 133* 136  K 3.8 4.2  CL 89* 93*  CO2 24 25  GLUCOSE 162* 120*  BUN 56* 40*  CREATININE 9.58* 7.94*    Basename 01/10/12 0639 01/09/12 2126  TROPONINI 0.79* 1.44*   Hepatic Function Panel  Basename 01/09/12 1517  PROT 6.2  ALBUMIN 3.1*  AST 32  ALT 14  ALKPHOS 76  BILITOT 0.5  BILIDIR 0.2  IBILI 0.3   No results found for this basename: CHOL in the last 72 hours No results found for  this basename: PROTIME in the last 72 hours  Imaging: Dg Chest Port 1 View  01/10/2012  *RADIOLOGY REPORT*  Clinical Data: Check endotracheal tube position.  Respiratory failure.  PORTABLE CHEST - 1 VIEW  Comparison: 01/09/2012.  Findings: Endotracheal tube is present with the tip 33 mm from the carina.  Enteric tube present with loop in the proximal stomach. Cardiopericardial silhouette within normal limits for size.  Mild perihilar and basilar airspace disease most compatible with pulmonary edema.  Small bilateral pleural effusions.  Basilar atelectasis.  Compared yesterday, no interval change.  IMPRESSION: No interval change.  Original Report Authenticated By: Dereck Ligas, M.D.   Dg Chest Port 1v Same Day  01/09/2012  *RADIOLOGY REPORT*  Clinical Data: Acute respiratory failure.  PORTABLE CHEST - 1 VIEW SAME DAY  Comparison: 01/09/2012.  Findings: Endotracheal tube is in satisfactory position. Nasogastric tube is followed into the stomach.  Heart size stable. Mild diffuse interstitial prominence indistinctness with bibasilar air space disease.  Right perihilar consolidation.  Left pleural effusion.  IMPRESSION: Congestive heart failure with minimal interval improvement.  Original Report Authenticated By: Luretha Rued, M.D.    Cardiac Studies:    Assessment/Plan:  Patient Active Hospital Problem List: CAD (coronary artery disease) (12/16/2011)   Assessment: Data to date does not suggest stent thrombosis.  Plan: given the scenario, suspect she should have quick cath on vent after dialysis, but would favor dialysis first given CXR and pulmonary edema.  Will go light on IV NTG. ESRD (end stage renal disease) (12/16/2011)   Assessment: discussed with Dr. Florene Glen   Plan: plan dialysis today.   Acute systolic CHF (congestive heart failure) (01/09/2012)   Assessment: prob needs dialysis first   Plan: defer extubation per CCM until dialysis and cath.  Will need to be seen after dialysis to make  decision, but she is on the board for possible cath.   Need to watch CBC carefully.   Heparin being adjusted down.           Bing Quarry, MD, [facility], [place] 01/10/2012, 8:10 AM

## 2012-01-10 NOTE — CV Procedure (Signed)
Cardiac Catheterization Procedure Note  Name: Shawna [name] MRN: [id number] DOB: 10-18-1940  Procedure: Right and Left Heart Cath, Selective Coronary Angiography, LV angiography,  PTCA/Stent of the ostial RCA.  Indication: 71 year old black female with history of end-stage renal disease and coronary disease. She is status post stenting of a diagonal branch and the left circumflex coronary several weeks ago with drug-eluting stents. At that time her LV function was normal. She was readmitted to the hospital recently with unstable angina. She then developed acute respiratory distress with flash pulmonary edema. Echocardiogram at that time showed reduction in her LV function with an ejection fraction of 35-40%.   Diagnostic Procedure Details: The right groin was prepped, draped, and anesthetized with 1% lidocaine. Using the modified Seldinger technique, a 5 French sheath was introduced into the right femoral artery. A 7 French sheath was introduced into the right femoral vein. A Swan-Ganz catheter was used to measure hemodynamics. Standard Judkins catheters were used for selective coronary angiography and left ventriculography. Catheter exchanges were performed over a wire.  The diagnostic procedure was well-tolerated without immediate complications.  PROCEDURAL FINDINGS Hemodynamics: RA: 7/7 with a mean of 5 mm mercury RV: 30/6 mmHg PA: 20/11 with a mean of 14 mmHg PCWP: 9/6 with a mean of 7 mm mercury AO 112/60 with a mean of 81 mmHg LV 114/10 mmHg  Sats: AO-100%, PA-81%  Cardiac output 8 L per minute, cardiac index 4.9 L per minute per meter square  Coronary angiography: Coronary dominance: right  Left mainstem: There is 30% narrowing of the distal left main.  Left anterior descending (LAD): Immediately after the first diagonal there is a 30% focal LAD stenosis. In the mid vessel there is a segmental 40-50% stenosis. The stent in the first diagonal is widely patent with no significant  stenosis. There is a small branch of this diagonal which is diffusely diseased to 99%.  Left circumflex (LCx): The left circumflex gives rise to a large first marginal branch and then continues and gives rise to 2 very small marginal branches. In the mid circumflex there is a stent which is widely patent. There is 20% narrowing of the first obtuse marginal vessel at the margin.  Right coronary artery (RCA): The right coronary is a very large dominant vessel. On initial angiography there were no significant stenoses noted. However with each catheter engagement there was severe dampening of pressures and further angulated views demonstrated a critical stenosis at the ostium up to 95%. This did not resolve with coronary nitroglycerin. There was mild calcification at the ostium.  Left ventriculography: Left ventricular systolic function is normal, LVEF is estimated at 55-65%, there is no significant mitral regurgitation   PCI Procedure Note:  Following the diagnostic procedure, the decision was made to proceed with PCI of the ostium of the RCA. The sheath was upsized to a 6 [place]. Weight-based bivalirudin was given for anticoagulation. Once a therapeutic ACT was achieved, a 6 [place]  FR4  guide catheter was inserted.  An Asahi medium  coronary guidewire was used to cross the lesion.  The lesion was predilated with a 2.5 mm  balloon.   We then dilated the ostium with a 3.5 mm cutting balloon. We then upgraded to a 4.0 mm cutting balloon and dilated the lesion again. The lesion was then stented with a  4.0 x 12 mm Promus  stent.  The stent was postdilated with a  4.5 mm noncompliant balloon flaring the stent at the ostium. .  Following  PCI, there was 0% residual stenosis and TIMI-3 flow. Final angiography confirmed an excellent result. Femoral hemostasis was achieved with  manual compression .  The patient tolerated the PCI procedure well. There were no immediate procedural complications.  The patient was  transferred to the post catheterization recovery area for further monitoring.  PCI Data: Vessel - RCA/Segment -  ostium  Percent Stenosis (pre)   95% TIMI-flow 3  Stent  4.0 x 12 mm Promus  Percent Stenosis (post)  0%  TIMI-flow (post)  3   Final Conclusions:   1. Single vessel obstructive atherosclerotic coronary disease with a critical ostial RCA stenosis. 2. Continued patency of the stents in the first diagonal and then the mid left circumflex. 3. Normal LV function. 4. Normal right heart pressures and left ventricular filling pressures. 5. Successful intracoronary stenting of the ostium of the right coronary with a drug-eluting stent.   Recommendations:  continue dual antiplatelet therapy for one year.   [name] [facility] 01/10/2012, 6:56 PM

## 2012-01-10 NOTE — Progress Notes (Signed)
[place] Progress Note Patient Name: Shawna [name] DOB: Jul 31, 1940 MRN: [id number]  Date of Service  01/10/2012   HPI/Events of Note  Hypotension following administration of clonidine and labetalol.  Current BP of 78/36 (51).  This has been ongoing for greater than 45 minutes.  Last BNP of greater than 9000 and patient is ESRD   eICU Interventions  Plan: 1 time dose of 50 ml of 25% albumin for BP support   Intervention Category Intermediate Interventions: Hypotension - evaluation and management  Iline Buchinger 01/10/2012, 11:39 PM

## 2012-01-10 NOTE — Progress Notes (Signed)
ANTICOAGULATION CONSULT NOTE - follow-up  Pharmacy Consult for Heparin Indication: ACS/ STEMI vs in-stent restenosis/thrombosis  Allergies  Allergen Reactions  . Aspirin Other (See Comments)    Mess up her stomach; "makes my bowels have blood in them"  . Contrast Media (Iodinated Diagnostic Agents) Itching  . Macrodantin (Nitrofurantoin Macrocrystal) Other (See Comments)    "broke me out in big old knots all over my body; had to go to ER"  . Penicillins Other (See Comments)    "makes me real weak when I take it; like I'll pass out"  . Bactrim (Sulfamethoxazole W-Trimethoprim) Rash  . Sulfa Antibiotics Rash  . Plavix (Clopidogrel Bisulfate) Rash    Patient Measurements: Height: 5\' 2"  (157.5 cm) Weight: 137 lb 2 oz (62.2 kg) IBW/kg (Calculated) : 50.1  Heparin Dosing Weight: 65 kg  Vital Signs: Temp: 98.5 F (36.9 C) (07/02 1214) Temp src: Oral (07/02 1214) BP: 105/69 mmHg (07/02 1400) Pulse Rate: 92  (07/02 1400)  Labs:  Basename 01/10/12 0639 01/10/12 0430 01/09/12 2130 01/09/12 2126 01/09/12 1528 01/09/12 1517  HGB -- 8.7* -- -- -- 9.6*  HCT -- 27.6* -- -- -- 30.1*  PLT -- 288 -- -- -- 325  APTT -- -- -- -- -- --  LABPROT -- 15.9* -- -- -- --  INR -- 1.24 -- -- -- --  HEPARINUNFRC -- 0.69 0.78* -- -- --  CREATININE -- 9.58* -- -- -- 7.94*  CKTOTAL 122 -- -- 129 149 --  CKMB 3.3 -- -- 2.8 2.7 --  TROPONINI 0.79* -- -- 1.44* 1.56* --    Estimated Creatinine Clearance: 4.7 ml/min (by C-G formula based on Cr of 9.58). - ESRD   Medical History: Past Medical History  Diagnosis Date  . ESRD (end stage renal disease) on dialysis 12/15/11    [place]; Mon; Wed; Fri  . Hypertension   . CHF (congestive heart failure)   . Anginal pain   . Chronic bronchitis 12/15/11    "have had it last 4 wk"  . Pneumonia ~ 2010  . Anemia   . History of blood transfusion 07/2011    "first time"  . GERD (gastroesophageal reflux disease)   . History of stomach ulcers   . History of lower  GI bleeding   . Arthritis     "hands and feet"  . History of gout   . Anxiety   . Ovarian cancer 1992  . Colon cancer   . CAD (coronary artery disease)     a. 12/2011 s/p DES to LCX and Diag   Assessment:   Hx NSTEMI and PCI 12/15/11.  Switched from Plavix to [place] on 01/05/12 due to rash, but just started this am. Transferred from [facility] to [facility] heparin infusing. Heparin level was at high end of goal today.  Patient currently in cath lab  Goal of Therapy:  Heparin level 0.3-0.7 units/ml Monitor platelets by anticoagulation protocol: Yes   Plan:    Decrease heparin drip to 650 units/hr per d/w Dr. Lia Foyer d/t remote hx of GIB   F/up post cath  Tommye Lehenbauer K. Posey Pronto, PharmD, BCPS.  Clinical Pharmacist Pager [phone]. 01/10/2012 4:10 PM

## 2012-01-10 NOTE — Consult Note (Signed)
Name: Shawna [name] MRN: [id number] DOB: 07-02-1941    LOS: 1 Date of admit 01/09/2012  2:25 PM   Referring Provider:  Velora Heckler cardiology Dr Salli Real and Dr Warren Danes Reason for Referral:  Acute Respiratory Failure  PULMONARY / CRITICAL CARE MEDICINE  Brief patient description:  The patient is a 71 year old female ESRD on HD, hx of ovarian and colon cancer, recent DES, admitted concerns stent thrombosis, Acute resp failure, transfer from [facility].  Events Since Admission: 7/1- transfer [facility], vent, NTG 7/2-for cath, HD  Current Status: On vent, aggitated  Vital Signs: Temp:  [97.9 F (36.6 C)-100.2 F (37.9 C)] 98.1 F (36.7 C) (07/02 0414) Pulse Rate:  [81-102] 99  (07/02 1045) Resp:  [15-20] 15  (07/02 1045) BP: (85-192)/(47-104) 129/69 mmHg (07/02 1045) SpO2:  [99 %-100 %] 100 % (07/02 1045) FiO2 (%):  [39.7 %-40.3 %] 40 % (07/02 1045) Weight:  [65.5 kg (144 lb 6.4 oz)-67 kg (147 lb 11.3 oz)] 67 kg (147 lb 11.3 oz) (07/02 0600)  Physical Examination: General:  On vent. aggiated Neuro:  RASS 1 HEENT:  ET tube Neck:  Supple, No nodes Cardiovascular:  Normocaridc. S1S2+. No murmur Lungs:  CTA bilaterally Abdomen:  Soft, no mass. Normal bowel sounds Musculoskeletal:  No edema, no cyanosis, no clubbing Skin:  Appears intact  [facility]: Blood gas: PH 7.59, pCO2 of 31, pO2 114, bicarb 29.7, CO2 is 30.7, saturation is 100%. White count 11,600, hemoglobin 9.5, hematocrit 29.9, platelet count 302,000. Troponin 1.45, then 2.8.     Principal Problem:  *CAD (coronary artery disease) Active Problems:  HTN (hypertension)  Hyperlipidemia  ESRD (end stage renal disease)  Acute systolic CHF (congestive heart failure)  Acute respiratory failure  Acute MI  GERD (gastroesophageal reflux disease)   ASSESSMENT AND PLAN  PULMONARY  Lab 01/09/12 1628  PHART 7.528*  PCO2ART 34.2*  PO2ART 131.0*  HCO3 28.3*  O2SAT 99.0   Ventilator Settings: Vent  Mode:  [-] CPAP FiO2 (%):  [39.7 %-40.3 %] 40 % Set Rate:  [15 bmp] 15 bmp Vt Set:  [430 mL] 430 mL PEEP:  [5 cmH20-5.1 cmH20] 5.1 cmH20 Pressure Support:  [10 cmH20] 10 cmH20 Plateau Pressure:  [15 cmH20-17 cmH20] 15 cmH20 CXR:  Reportedly acute pulm edema at [facility] ETT:  01/08/12 at [facility] v 01/09/12 at [facility] >>  A:  Acute REsp failure due to Acute Pulmonary edema P:   Full vent support to continue prior to cath Likely should avoid PS weaning unless high PS utilized pre cath Check abg post reduction to 7 cc/kg pcxr in am  volume removal on HD  CARDIOVASCULAR  Lab 01/10/12 0639 01/09/12 2126 01/09/12 1528 01/09/12 1517  TROPONINI 0.79* 1.44* 1.56* --  LATICACIDVEN -- -- -- 1.7  PROBNP -- -- 9339.0* --   ECG:   EKG showed left ventricular hypertrophy and nonspecific ST-T wave changes, although there is some ST-segment depression inferior leads.   Lines: PIV  A: ACute MI - stent thrombosis v fresh MI Acute Pulmnary edema  P:  To cath lab on vent - timing post HD planned Meds per [place] cards Asa Noted plavix allergy ntg on board, unable to use propofol, dc  RENAL  Lab 01/10/12 0430 01/09/12 1517  NA 133* 136  K 3.8 4.2  CL 89* 93*  CO2 24 25  BUN 56* 40*  CREATININE 9.58* 7.94*  CALCIUM 8.6 8.7  MG -- 1.4*  PHOS -- 3.7   Intake/Output  07/01 0701 - 07/02 0700 07/02 0701 - 07/03 0700   I.V. (mL/kg) 76.6 (1.1) 32.5 (0.5)   Total Intake(mL/kg) 76.6 (1.1) 32.5 (0.5)   Total Output 0    Net +76.6 +32.5         Foley:  ?  A:  ESRD P:   Renal consulted, hd on going now Chem in am  kvo  GASTROINTESTINAL  Lab 01/09/12 1517  AST 32  ALT 14  ALKPHOS 76  BILITOT 0.5  PROT 6.2  ALBUMIN 3.1*    A:  Nil acute P:   Check LFT - reviewed ppi NPO cath  HEMATOLOGIC  Lab 01/10/12 0430 01/09/12 1517  HGB 8.7* 9.6*  HCT 27.6* 30.1*  PLT 288 325  INR 1.24 --  APTT -- --   A:  Nil acute but at risk for anemia of critical illness P:  -  PRBC for hgb </= 6.9gm%    - exceptions are   -  if ACS susepcted/confirmed then transfuse for hgb </= 8.0gm%,  or  Asa Cbc in am  Full heparin per pharm  INFECTIOUS  Lab 01/10/12 0430 01/09/12 1517  WBC 13.4* 13.5*  PROCALCITON -- --   Cultures: none Antibiotics: none  A:  No evidence of infection P:   monitor  ENDOCRINE  Lab 01/10/12 0826 01/10/12 0413 01/10/12 0015 01/09/12 2015 01/09/12 1600  GLUCAP 138* 143* 157* 122* 100*   A:  none   P:   icu hyoperglycemia  NEUROLOGIC  A:  Reportedly normal mental statu. Currently RASS -3 on diprivan P:   Hypotension, dc propofol, addd fent drip Avoid versed Daily [place] / DISPOSITION Level of Care:  ICU Primary Service:  cards Consultants:  pccm Code Status:  full Diet:  npo DVT Px:  Therapeutic anticog for mi GI Px:  ppi Skin Integrity:  intact Social / Family:  Unknown   The patient is critically ill with multiple organ systems failure and requires high complexity decision making for assessment and support, frequent evaluation and titration of therapies, application of advanced monitoring technologies and extensive interpretation of multiple databases.   Critical Care Time devoted to patient care services described in this note is  30  Minutes.  [name]. Titus Mould, MD, [place] Pgr: [facility]

## 2012-01-10 NOTE — Interval H&P Note (Signed)
History and Physical Interval Note:  01/10/2012 3:04 PM  Shawna [name]  has presented today for surgery, with the diagnosis of cp  The various methods of treatment have been discussed with the patient and family. After consideration of risks, benefits and other options for treatment, the patient has consented to  Procedure(s) (LRB): LEFT HEART CATHETERIZATION WITH CORONARY ANGIOGRAM (N/A) as a surgical intervention .  The patient's history has been reviewed, patient examined, no change in status, stable for surgery.  I have reviewed the patients' chart and labs.  Questions were answered to the patient's satisfaction.     [name] [facility] 01/10/2012 3:04 PM

## 2012-01-11 ENCOUNTER — Inpatient Hospital Stay (HOSPITAL_COMMUNITY): Payer: Medicare Other

## 2012-01-11 DIAGNOSIS — I509 Heart failure, unspecified: Secondary | ICD-10-CM

## 2012-01-11 LAB — POCT I-STAT 3, ART BLOOD GAS (G3+)
Acid-Base Excess: 1 mmol/L (ref 0.0–2.0)
Bicarbonate: 24.9 meq/L — ABNORMAL HIGH (ref 20.0–24.0)
O2 Saturation: 100 %
Patient temperature: 98.4
TCO2: 26 mmol/L (ref 0–100)
pCO2 arterial: 36 mmHg (ref 35.0–45.0)
pH, Arterial: 7.447 — ABNORMAL HIGH (ref 7.350–7.400)
pO2, Arterial: 162 mmHg — ABNORMAL HIGH (ref 80.0–100.0)

## 2012-01-11 LAB — GLUCOSE, CAPILLARY
Glucose-Capillary: 135 mg/dL — ABNORMAL HIGH (ref 70–99)
Glucose-Capillary: 158 mg/dL — ABNORMAL HIGH (ref 70–99)
Glucose-Capillary: 92 mg/dL (ref 70–99)

## 2012-01-11 LAB — CARDIAC PANEL(CRET KIN+CKTOT+MB+TROPI)
CK, MB: 6.7 ng/mL (ref 0.3–4.0)
Relative Index: 3.2 — ABNORMAL HIGH (ref 0.0–2.5)
Troponin I: 0.59 ng/mL (ref <0.30)

## 2012-01-11 LAB — CBC WITH DIFFERENTIAL/PLATELET
Basophils Absolute: 0 10*3/uL (ref 0.0–0.1)
Basophils Relative: 0 % (ref 0–1)
Eosinophils Absolute: 0 10*3/uL (ref 0.0–0.7)
Eosinophils Relative: 0 % (ref 0–5)
Lymphocytes Relative: 10 % — ABNORMAL LOW (ref 12–46)
MCH: 29.6 pg (ref 26.0–34.0)
MCHC: 31.7 g/dL (ref 30.0–36.0)
MCV: 93.6 fL (ref 78.0–100.0)
Platelets: 268 10*3/uL (ref 150–400)
RDW: 17.4 % — ABNORMAL HIGH (ref 11.5–15.5)
WBC: 13.8 10*3/uL — ABNORMAL HIGH (ref 4.0–10.5)

## 2012-01-11 LAB — COMPREHENSIVE METABOLIC PANEL
Albumin: 3.1 g/dL — ABNORMAL LOW (ref 3.5–5.2)
Alkaline Phosphatase: 60 U/L (ref 39–117)
BUN: 45 mg/dL — ABNORMAL HIGH (ref 6–23)
Chloride: 91 mEq/L — ABNORMAL LOW (ref 96–112)
Potassium: 5 mEq/L (ref 3.5–5.1)
Total Bilirubin: 0.3 mg/dL (ref 0.3–1.2)

## 2012-01-11 IMAGING — CR DG CHEST 1V PORT
1 series · 1 of 1 positions shown · non-contrast
Comparison: [DATE].

CLINICAL DATA: Check endotracheal tube position.

PORTABLE CHEST - 1 VIEW

[AP]
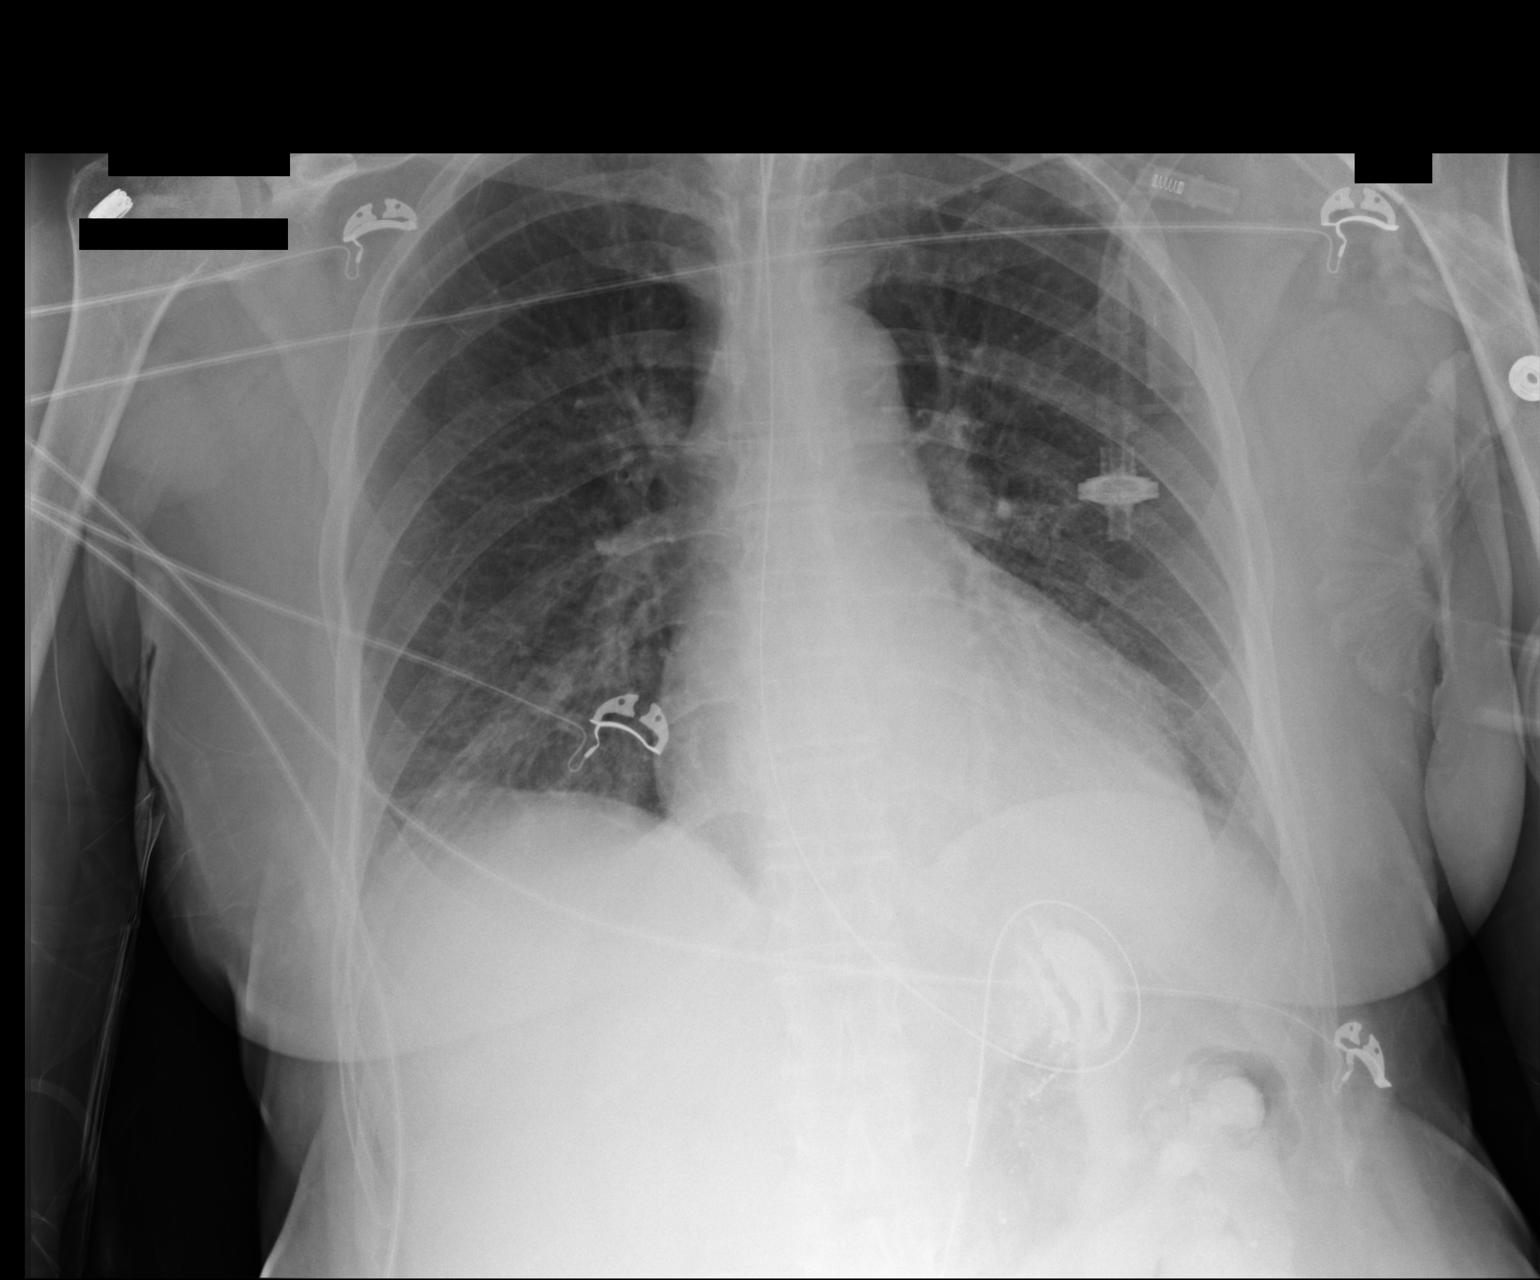

[1 of 1 positions shown; findings below may reference images not displayed]

FINDINGS: Endotracheal tube is in satisfactory position.
Nasogastric tube is followed into the stomach.  Heart size stable.
There has been interval improvement in bibasilar dependent air
space disease, without complete resolution.  Left pleural effusion
has completely or nearly completely resolved in the interval.
IMPRESSION: Improving edema and left pleural effusion.

## 2012-01-11 MED ORDER — HEPARIN SODIUM (PORCINE) 1000 UNIT/ML DIALYSIS
20.0000 [IU]/kg | INTRAMUSCULAR | Status: DC | PRN
  Filled 2012-01-11: qty 2

## 2012-01-11 MED ORDER — HEPARIN SODIUM (PORCINE) 5000 UNIT/ML IJ SOLN
5000.0000 [IU] | Freq: Three times a day (TID) | INTRAMUSCULAR | Status: DC
  Administered 2012-01-11 – 2012-01-15 (×13): 5000 [IU] via SUBCUTANEOUS
  Filled 2012-01-11 (×15): qty 1

## 2012-01-11 MED ORDER — LABETALOL HCL 5 MG/ML IV SOLN
10.0000 mg | INTRAVENOUS | Status: DC | PRN
  Administered 2012-01-12 – 2012-01-25 (×8): 10 mg via INTRAVENOUS
  Filled 2012-01-11 (×12): qty 4

## 2012-01-11 MED ORDER — ATROPINE SULFATE 1 MG/ML IJ SOLN
INTRAMUSCULAR | Status: AC
  Filled 2012-01-11: qty 1

## 2012-01-11 MED ORDER — ALBUMIN HUMAN 5 % IV SOLN
INTRAVENOUS | Status: AC
  Filled 2012-01-11: qty 250

## 2012-01-11 MED FILL — Dextrose Inj 5%: INTRAVENOUS | Qty: 50 | Status: AC

## 2012-01-11 NOTE — Progress Notes (Signed)
PT stated chest pain, 12 lead EKG preformed, Dr Harland Dingwall consulted, cardiologist called.  Cardiologist, Sharolyn Douglas, saw PT determined no further actions required. Pt asked if pain continued, pt denied continued pain.

## 2012-01-11 NOTE — Progress Notes (Signed)
Name: Shawna [name] MRN: [id number] DOB: 1941/05/23    LOS: 2 Date of admit 01/09/2012  2:25 PM   Referring Provider:  Velora Heckler cardiology Dr Salli Real and Dr Warren Danes Reason for Referral:  Acute Respiratory Failure  PULMONARY / CRITICAL CARE MEDICINE  Brief patient description:  The patient is a 71 year old female ESRD on HD, hx of ovarian and colon cancer, recent DES, admitted concerns stent thrombosis, Acute resp failure, transfer from [place].  Events Since Admission: 7/1- transfer [facility], vent, NTG 7/2-for cath, HD 7/2- stent placed card cath, HD to neg balance pre cath  Current Status: Residual sedation likely  Vital Signs: Temp:  [97.4 F (36.3 C)-98.5 F (36.9 C)] 98 F (36.7 C) (07/03 0818) Pulse Rate:  [66-107] 75  (07/03 0825) Resp:  [10-21] 16  (07/03 0825) BP: (82-195)/(37-96) 102/43 mmHg (07/03 0825) SpO2:  [94 %-100 %] 100 % (07/03 0825) Arterial Line BP: (73-185)/(25-61) 107/49 mmHg (07/03 0100) FiO2 (%):  [39.8 %-40.3 %] 40 % (07/03 0825) Weight:  [137 lb 2 oz (62.2 kg)-144 lb 10 oz (65.6 kg)] 138 lb 3.7 oz (62.7 kg) (07/03 0400)  Physical Examination: General:  On vent rass -3, lethargic Neuro:  Nonfocal, suction her own mouth HEENT:  ET tube Neck:  Supple, No nodes Cardiovascular:  Normocaridc. S1S2+. No murmur Lungs:  CTA , coarse bases Abdomen:  Soft, no mass. Normal bowel sounds Musculoskeletal:  No edema Skin:  Appears intact  Principal Problem:  *CAD (coronary artery disease) Active Problems:  HTN (hypertension)  Hyperlipidemia  ESRD (end stage renal disease)  Acute systolic CHF (congestive heart failure)  Acute respiratory failure  Acute MI  GERD (gastroesophageal reflux disease)   ASSESSMENT AND PLAN  PULMONARY  Lab 01/10/12 1529 01/10/12 1525 01/10/12 1232 01/09/12 1628  PHART -- 7.459* 7.495* 7.528*  PCO2ART -- 41.2 39.0 34.2*  PO2ART -- 521.0* 149.0* 131.0*  HCO3 26.0* 29.2* 30.1* 28.3*  O2SAT 81.0 100.0 99.0 99.0    Ventilator Settings: Vent Mode:  [-] PRVC FiO2 (%):  [39.8 %-40.3 %] 40 % Set Rate:  [15 bmp] 15 bmp Vt Set:  [350 mL] 350 mL PEEP:  [5 cmH20-5.1 cmH20] 5 cmH20 Pressure Support:  [5 cmH20-10 cmH20] 5 cmH20 Plateau Pressure:  [13 cmH20-14 cmH20] 13 cmH20 CXR:  Reportedly acute pulm edema at [facility] ETT:  01/08/12 at [facility] v 01/09/12 at [facility] >>  A:  Acute REsp failure due to Acute Pulmonary edema, improved edema P:   concern is residual sedation, weaning attempts made cpap 5 ps 5,  Fent off Repeat wean CPAP 5 PS 5, assess rsbi and strength, VC, NIF Improve dafter neg balance on hd pcxr reviewed, repeat in am   CARDIOVASCULAR  Lab 01/10/12 0639 01/09/12 2126 01/09/12 1528 01/09/12 1517  TROPONINI 0.79* 1.44* 1.56* --  LATICACIDVEN -- -- -- 1.7  PROBNP -- -- 9339.0* --   ECG:   EKG showed left ventricular hypertrophy and nonspecific ST-T wave changes, although there is some ST-segment depression inferior leads.   Lines: PIV  A: ACute MI - stent thrombosis v fresh MI Acute Pulmnary edema S/p stent 7/2  P:  Meds per [name] cards [name], brilinta Consider NTG to off tele  RENAL  Lab 01/11/12 0430 01/10/12 1525 01/10/12 0430 01/09/12 1517  NA 134* 138 133* 136  K 5.0 3.9 -- --  CL 91* 100 89* 93*  CO2 22 -- 24 25  BUN 45* 24* 56* 40*  CREATININE 6.55* 5.10* 9.58* 7.94*  CALCIUM 8.5 -- 8.6 8.7  MG -- -- -- 1.4*  PHOS -- -- -- 3.7   Intake/Output      07/02 0701 - 07/03 0700 07/03 0701 - 07/04 0700   I.V. (mL/kg) 498 (7.9)    Total Intake(mL/kg) 498 (7.9)    Urine (mL/kg/hr) 70 (0) 0   Other 3075    Total Output 3145 0   Net -2647 0         Foley:  Dc 7/3  A:  ESRD P:   Hd in am planned limit volume  GASTROINTESTINAL  Lab 01/11/12 0430 01/09/12 1517  AST 18 32  ALT 10 14  ALKPHOS 60 76  BILITOT 0.3 0.5  PROT 6.2 6.2  ALBUMIN 3.1* 3.1*    A:  NPO P:   ppi Maintain npo, hope for extubation  HEMATOLOGIC  Lab 01/11/12 0430 01/10/12  1525 01/10/12 1431 01/10/12 0430 01/09/12 1517  HGB 8.3* 10.5* 8.4* 8.7* 9.6*  HCT 26.2* 31.0* 26.4* 27.6* 30.1*  PLT 268 -- 299 288 325  INR -- -- -- 1.24 --  APTT -- -- -- -- --   A:  Anemia in setting ischemia P:  -  if ACS susepcted/confirmed then transfuse for hgb </= 8.0gm%,  or  Asa Cbc in am Full heparin off, add sub q hep  INFECTIOUS  Lab 01/11/12 0430 01/10/12 1431 01/10/12 0430 01/09/12 1517  WBC 13.8* 12.4* 13.4* 13.5*  PROCALCITON -- -- -- --   Cultures: none Antibiotics: none  A:  Chronic diflucan home med P:   Cont diflucan  ENDOCRINE  Lab 01/11/12 0816 01/11/12 0420 01/11/12 0012 01/10/12 2031 01/10/12 1722  GLUCAP 119* 121* 158* 137* 115*   A:  none   P:   icu hyoperglycemia  NEUROLOGIC  A:  Oversedation? P:   Dc all sedation , observe  BEST PRACTICE / DISPOSITION Level of Care:  ICU Primary Service:  cards Consultants:  pccm Code Status:  full Diet:  npo DVT Px:  Therapeutic anticog for mi GI Px:  ppi Skin Integrity:  intact Social / Family:  Unknown   The patient is critically ill with multiple organ systems failure and requires high complexity decision making for assessment and support, frequent evaluation and titration of therapies, application of advanced monitoring technologies and extensive interpretation of multiple databases.   Critical Care Time devoted to patient care services described in this note is  30  Minutes.  [name]. Titus Mould, MD, [place] Pgr: [facility]

## 2012-01-11 NOTE — Progress Notes (Signed)
Prior to removal of Rt groin femoral and venous sheath, site Level 0, small hematoma palpable size of dime around insertion site, pedal pulses 2+, VSS. Rt Femoral sheath removed, manual pressure held x88mins, VSS, site Level 0, pt tolerated well. Venous sheath removed, manual pressure held x10 mins, VSS, site Level 0, pt tolerated well. Pressure dressing applied, pedal pulses 2+. Post sheath removal instructions given to pt and RN.

## 2012-01-11 NOTE — Progress Notes (Signed)
   S:  CTSP 2/2 complaint of chest pain.  Per RN, pt was sleeping, awoke and indicated that she was having c/p (intubated).  This resolved spontaneously within a few mins - she is unable to quantify duration.  O:   Filed Vitals:   01/11/12 1600  BP: 118/53  Pulse: 80  Temp: 18  Resp: 15  Intubated.  Pleasant, follows commands.  NAD.  Neck, supple, no obvious JVD.  Lungs: diminished breath sounds w/o crackles bilat.  Cor: RRR,no murmurs.  Ext:  No edema.  DP/PT 2 + bilat.  ECG: RSR, 79, LVH, TWI I,aVL ->no acute changes.  A/P:  1.  NSTEMI/CAD/APE:  S/p PCI/DES to RCA yesterday.  Report of brief c/p today.  ECG baseline.  Repeat CE.  Cont current meds.  F/U ECG in AM.   Murray Hodgkins, NP

## 2012-01-11 NOTE — Progress Notes (Signed)
Assessment/Plan:  1 ESRD 2 Respiratory Failure, Intubated 3 CHF  4 ASCVD s/p PCI  Plan.  Weaning.  HD off schedule Thursday.  Subjective: Interval History: Had cardiac PCI yesterday.  Objective: Vital signs in last 24 hours: Temp:  [97.4 F (36.3 [name])-98.5 F (36.9 [name])] 98 F (36.7 [name]) (07/03 0818) Pulse Rate:  [66-107] 76  (07/03 0900) Resp:  [10-21] 15  (07/03 0900) BP: (82-195)/(37-96) 116/44 mmHg (07/03 0900) SpO2:  [94 %-100 %] 100 % (07/03 0900) Arterial Line BP: (73-185)/(25-61) 107/49 mmHg (07/03 0100) FiO2 (%):  [39.8 %-40.3 %] 39.9 % (07/03 0900) Weight:  [62.2 kg (137 lb 2 oz)-65.6 kg (144 lb 10 oz)] 62.7 kg (138 lb 3.7 oz) (07/03 0400) Weight change: 0.1 kg (3.5 oz)  Intake/Output from previous day: 07/02 0701 - 07/03 0700 In: 498 [I.V.:498] Out: 3145 [Urine:70] Intake/Output this shift:    General appearance: slowed mentation, sedated Lung clear Cor RRR Abd soft Extr edema  Lab Results:  Basename 01/11/12 0430 01/10/12 1525 01/10/12 1431  WBC 13.8* -- 12.4*  HGB 8.3* 10.5* --  HCT 26.2* 31.0* --  PLT 268 -- 299   BMET:  Basename 01/11/12 0430 01/10/12 1525 01/10/12 0430  NA 134* 138 --  K 5.0 3.9 --  CL 91* 100 --  CO2 22 -- 24  GLUCOSE 125* 168* --  BUN 45* 24* --  CREATININE 6.55* 5.10* --  CALCIUM 8.5 -- 8.6   No results found for this basename: PTH:2 in the last 72 hours Iron Studies: No results found for this basename: IRON,TIBC,TRANSFERRIN,FERRITIN in the last 72 hours Studies/Results: Dg Chest Port 1 View  01/11/2012  *RADIOLOGY REPORT*  Clinical Data: Check endotracheal tube position.  PORTABLE CHEST - 1 VIEW  Comparison: 01/10/2012.  Findings: Endotracheal tube is in satisfactory position. Nasogastric tube is followed into the stomach.  Heart size stable. There has been interval improvement in bibasilar dependent air space disease, without complete resolution.  Left pleural effusion has completely or nearly completely resolved in the  interval.  IMPRESSION: Improving edema and left pleural effusion.  Original Report Authenticated By: Luretha Rued, M.D.   Dg Chest Port 1 View  01/10/2012  *RADIOLOGY REPORT*  Clinical Data: Check endotracheal tube position.  Respiratory failure.  PORTABLE CHEST - 1 VIEW  Comparison: 01/09/2012.  Findings: Endotracheal tube is present with the tip 33 mm from the carina.  Enteric tube present with loop in the proximal stomach. Cardiopericardial silhouette within normal limits for size.  Mild perihilar and basilar airspace disease most compatible with pulmonary edema.  Small bilateral pleural effusions.  Basilar atelectasis.  Compared yesterday, no interval change.  IMPRESSION: No interval change.  Original Report Authenticated By: Dereck Ligas, M.D.   Dg Chest Port 1v Same Day  01/09/2012  *RADIOLOGY REPORT*  Clinical Data: Acute respiratory failure.  PORTABLE CHEST - 1 VIEW SAME DAY  Comparison: 01/09/2012.  Findings: Endotracheal tube is in satisfactory position. Nasogastric tube is followed into the stomach.  Heart size stable. Mild diffuse interstitial prominence indistinctness with bibasilar air space disease.  Right perihilar consolidation.  Left pleural effusion.  IMPRESSION: Congestive heart failure with minimal interval improvement.  Original Report Authenticated By: Luretha Rued, M.D.    Scheduled:   . albumin human  12.5 g Intravenous Once  . allopurinol  300 mg Oral Daily  . amLODipine  10 mg Oral Daily  . antiseptic oral rinse  15 mL Mouth Rinse QID  . aspirin  81 mg Oral  Daily  . atorvastatin  20 mg Oral q1800  . bivalirudin      . chlorhexidine  15 mL Mouth Rinse BID  . cloNIDine  0.2 mg Oral BID  . fentaNYL      . fluconazole  100 mg Oral Daily  . heparin      . insulin aspart  0-3 Units Subcutaneous Q4H  . iron polysaccharides  150 mg Oral BID WC  . labetalol  200 mg Oral BID  . lanthanum  1,000 mg Oral TID WC  . lidocaine      . midazolam      . midazolam        . multivitamin  1 tablet Oral Daily  . nitroGLYCERIN      . pantoprazole sodium  20 mg Per Tube Q1200  . sodium chloride  3 mL Intravenous Q12H  . Ticagrelor  90 mg Oral BID  . DISCONTD: sodium chloride  3 mL Intravenous Q12H      LOS: 2 days   Shawna [name] 01/11/2012,9:41 AM

## 2012-01-11 NOTE — Progress Notes (Signed)
INITIAL ADULT NUTRITION ASSESSMENT Date: 01/11/2012   Time: 2:24 PM  INTERVENTION:  If unable to extubate patient in the next 24 hours, recommend start TF via enteral feeding tube with Nepro at 15 ml/h, increase by 10 ml every 4 hours to goal rate of 25 ml/h with Prostat 30 ml BID to provide 1280 kcals, 79 grams protein, 436 ml free water daily.  Reason for Assessment: VDRF  ASSESSMENT: Female 71 y.o.  Dx: CAD (coronary artery disease); transferred from [facility] with acute respiratory failure, concerns for stent thrombosis.  Hx:  Past Medical History  Diagnosis Date  . ESRD (end stage renal disease) on dialysis 12/15/11    [place]; Mon; Wed; Fri  . Hypertension   . CHF (congestive heart failure)   . Anginal pain   . Chronic bronchitis 12/15/11    "have had it last 4 wk"  . Pneumonia ~ 2010  . Anemia   . History of blood transfusion 07/2011    "first time"  . GERD (gastroesophageal reflux disease)   . History of stomach ulcers   . History of lower GI bleeding   . Arthritis     "hands and feet"  . History of gout   . Anxiety   . Ovarian cancer 1992  . Colon cancer   . CAD (coronary artery disease)     a. 12/2011 s/p DES to LCX and Diag    Past Surgical History  Procedure Date  . Coronary angioplasty with stent placement 12/15/11    "2"  . Abdominal hysterectomy 1992  . Appendectomy 06/1990  . Tubal ligation 1980's  . Av fistula placement 07/2009    left upper arm  . Thrombectomy / arteriovenous graft revision     left upper arm  . Colon resection 1992    Related Meds:  Scheduled Meds:   . albumin human  12.5 g Intravenous Once  . allopurinol  300 mg Oral Daily  . amLODipine  10 mg Oral Daily  . antiseptic oral rinse  15 mL Mouth Rinse QID  . aspirin  81 mg Oral Daily  . atorvastatin  20 mg Oral q1800  . bivalirudin      . chlorhexidine  15 mL Mouth Rinse BID  . cloNIDine  0.2 mg Oral BID  . fentaNYL      . fluconazole  100 mg Oral Daily  . heparin        . heparin subcutaneous  5,000 Units Subcutaneous Q8H  . insulin aspart  0-3 Units Subcutaneous Q4H  . iron polysaccharides  150 mg Oral BID WC  . labetalol  200 mg Oral BID  . lanthanum  1,000 mg Oral TID WC  . lidocaine      . midazolam      . midazolam      . multivitamin  1 tablet Oral Daily  . nitroGLYCERIN      . pantoprazole sodium  20 mg Per Tube Q1200  . sodium chloride  3 mL Intravenous Q12H  . Ticagrelor  90 mg Oral BID  . DISCONTD: sodium chloride  3 mL Intravenous Q12H   Continuous Infusions:   . sodium chloride 10 mL/hr (01/11/12 0112)  . dextrose    . dextrose 5 % and 0.45% NaCl 10 mL/hr (01/11/12 0158)  . fentaNYL infusion INTRAVENOUS Stopped (01/11/12 [id number])  . DISCONTD: heparin Stopped (01/10/12 1436)  . DISCONTD: nitroGLYCERIN 10 mcg/min (01/10/12 1234)   PRN Meds:.sodium chloride, acetaminophen, ALPRAZolam, dextrose, fentaNYL, fentaNYL, fentaNYL, heparin, midazolam,  nitroGLYCERIN, ondansetron (ZOFRAN) IV, sodium chloride, zolpidem, DISCONTD: sodium chloride, DISCONTD: sodium chloride   Ht: 5\' 2"  (157.5 cm)  Wt: 138 lb 3.7 oz (62.7 kg)  Ideal Wt: 50 kg % Ideal Wt: 125%  Wt Readings from Last 14 Encounters:  01/11/12 138 lb 3.7 oz (62.7 kg)  01/11/12 138 lb 3.7 oz (62.7 kg)  01/05/12 142 lb (64.411 kg)  12/16/11 140 lb 6.9 oz (63.7 kg)  12/16/11 140 lb 6.9 oz (63.7 kg)    Usual Wt: 140 lb  % Usual Wt: 99%  Body mass index is 25.28 kg/(m^2).  Food/Nutrition Related Hx: No nutrition problems identified on admission nutrition screen  Labs:  CMP     Component Value Date/Time   NA 134* 01/11/2012 0430   K 5.0 01/11/2012 0430   CL 91* 01/11/2012 0430   CO2 22 01/11/2012 0430   GLUCOSE 125* 01/11/2012 0430   BUN 45* 01/11/2012 0430   CREATININE 6.55* 01/11/2012 0430   CALCIUM 8.5 01/11/2012 0430   PROT 6.2 01/11/2012 0430   ALBUMIN 3.1* 01/11/2012 0430   AST 18 01/11/2012 0430   ALT 10 01/11/2012 0430   ALKPHOS 60 01/11/2012 0430   BILITOT 0.3 01/11/2012 0430    GFRNONAA 6* 01/11/2012 0430   GFRAA 7* 01/11/2012 0430    Phosphorus  Date/Time Value Range Status  01/09/2012  3:17 PM 3.7  2.3 - 4.6 mg/dL Final     CBG (last 3)   Basename 01/11/12 1200 01/11/12 0816 01/11/12 0420  GLUCAP 135* 119* 121*     Intake/Output Summary (Last 24 hours) at 01/11/12 1429 Last data filed at 01/11/12 0748  Gross per 24 hour  Intake    355 ml  Output      0 ml  Net    355 ml     Diet Order: NPO   IVF:    sodium chloride Last Rate: 10 mL/hr (01/11/12 0112)  dextrose   dextrose 5 % and 0.45% NaCl Last Rate: 10 mL/hr (01/11/12 0158)  fentaNYL infusion INTRAVENOUS Last Rate: Stopped (01/11/12 [id number])  DISCONTD: heparin Last Rate: Stopped (01/10/12 1436)  DISCONTD: nitroGLYCERIN Last Rate: 10 mcg/min (01/10/12 1234)    Estimated Nutritional Needs:   Kcal: 1200-1300 Protein: 70-80 grams Fluid: 1.2-1.4 liters  Patient is weaning on ventilator.  Plans for HD tomorrow.  NUTRITION DIAGNOSIS: -Inadequate oral intake (NI-2.1).  Status: Ongoing  RELATED TO: inability to eat  AS EVIDENCED BY: NPO status  MONITORING/EVALUATION(Goals):  Goal:  Intake to meet 90-100% of estimated nutrition needs.  Monitor for extubation with diet advancement vs. initiation of nutrition support.  EDUCATION NEEDS: -Education not appropriate at this time    [place] Per approved criteria  -Not Applicable    Molli Barrows, RD, CNSC, LDN Pager# [phone] After Hours Pager# [phone]  01/11/2012, 2:24 PM

## 2012-01-11 NOTE — Progress Notes (Signed)
Patient ID: Shawna [name], female   DOB: 1940-08-05, 71 y.o.   MRN: [id number]    Subjective:  Intubated weaning fentanyl drip just D/C  Objective:  Filed Vitals:   01/11/12 0500 01/11/12 0600 01/11/12 0700 01/11/12 0738  BP: 118/45 127/45 126/48 126/48  Pulse: 70 67 72 67  Temp:      TempSrc:      Resp: 16 15 17 10   Height:      Weight:      SpO2: 100% 100% 100% 100%    Intake/Output from previous day:  Intake/Output Summary (Last 24 hours) at 01/11/12 0810 Last data filed at 01/11/12 0748  Gross per 24 hour  Intake  481.5 ml  Output   3115 ml  Net -2633.5 ml    Physical Exam: Affect appropriate Obese intubated black female HEENT: normal Neck supple with no adenopathy JVP normal no bruits no thyromegaly Lungs clear with no wheezing and good diaphragmatic motion Heart:  S1/S2 no murmur, no rub, gallop or click PMI normal Abdomen: benighn, BS positve, no tenderness, no AAA no bruit.  No HSM or HJR Distal pulses intact with no bruits No edema Neuro non-focal Skin warm and dry Foley catheter Right groin no hematoma   Lab Results: Basic Metabolic Panel:  Basename 01/11/12 0430 01/10/12 1525 01/10/12 0430 01/09/12 1517  NA 134* 138 -- --  K 5.0 3.9 -- --  CL 91* 100 -- --  CO2 22 -- 24 --  GLUCOSE 125* 168* -- --  BUN 45* 24* -- --  CREATININE 6.55* 5.10* -- --  CALCIUM 8.5 -- 8.6 --  MG -- -- -- 1.4*  PHOS -- -- -- 3.7   Liver Function Tests:  Basename 01/11/12 0430 01/09/12 1517  AST 18 32  ALT 10 14  ALKPHOS 60 76  BILITOT 0.3 0.5  PROT 6.2 6.2  ALBUMIN 3.1* 3.1*   No results found for this basename: LIPASE:2,AMYLASE:2 in the last 72 hours CBC:  Basename 01/11/12 0430 01/10/12 1525 01/10/12 1431  WBC 13.8* -- 12.4*  NEUTROABS 11.3* -- --  HGB 8.3* 10.5* --  HCT 26.2* 31.0* --  MCV 93.6 -- 91.7  PLT 268 -- 299   Cardiac Enzymes:  Basename 01/10/12 0639 01/09/12 2126 01/09/12 1528  CKTOTAL 122 129 149  CKMB 3.3 2.8 2.7  CKMBINDEX --  -- --  TROPONINI 0.79* 1.44* 1.56*    Imaging: Dg Chest Port 1 View  01/11/2012  *RADIOLOGY REPORT*  Clinical Data: Check endotracheal tube position.  PORTABLE CHEST - 1 VIEW  Comparison: 01/10/2012.  Findings: Endotracheal tube is in satisfactory position. Nasogastric tube is followed into the stomach.  Heart size stable. There has been interval improvement in bibasilar dependent air space disease, without complete resolution.  Left pleural effusion has completely or nearly completely resolved in the interval.  IMPRESSION: Improving edema and left pleural effusion.  Original Report Authenticated By: Luretha Rued, M.D.   Dg Chest Port 1 View  01/10/2012  *RADIOLOGY REPORT*  Clinical Data: Check endotracheal tube position.  Respiratory failure.  PORTABLE CHEST - 1 VIEW  Comparison: 01/09/2012.  Findings: Endotracheal tube is present with the tip 33 mm from the carina.  Enteric tube present with loop in the proximal stomach. Cardiopericardial silhouette within normal limits for size.  Mild perihilar and basilar airspace disease most compatible with pulmonary edema.  Small bilateral pleural effusions.  Basilar atelectasis.  Compared yesterday, no interval change.  IMPRESSION: No interval change.  Original Report Authenticated  By: Dereck Ligas, M.D.   Dg Chest Port 1v Same Day  01/09/2012  *RADIOLOGY REPORT*  Clinical Data: Acute respiratory failure.  PORTABLE CHEST - 1 VIEW SAME DAY  Comparison: 01/09/2012.  Findings: Endotracheal tube is in satisfactory position. Nasogastric tube is followed into the stomach.  Heart size stable. Mild diffuse interstitial prominence indistinctness with bibasilar air space disease.  Right perihilar consolidation.  Left pleural effusion.  IMPRESSION: Congestive heart failure with minimal interval improvement.  Original Report Authenticated By: Luretha Rued, M.D.    Cardiac Studies:  ECG: NSR PVC no acute ST/T wave changes   Telemetry: NSR rates 70  01/11/2012  Medications:     . albumin human  12.5 g Intravenous Once  . allopurinol  300 mg Oral Daily  . amLODipine  10 mg Oral Daily  . antiseptic oral rinse  15 mL Mouth Rinse QID  . aspirin  81 mg Oral Daily  . atorvastatin  20 mg Oral q1800  . bivalirudin      . chlorhexidine  15 mL Mouth Rinse BID  . cloNIDine  0.2 mg Oral BID  . fentaNYL      . fluconazole  100 mg Oral Daily  . heparin      . insulin aspart  0-3 Units Subcutaneous Q4H  . iron polysaccharides  150 mg Oral BID WC  . labetalol  200 mg Oral BID  . lanthanum  1,000 mg Oral TID WC  . lidocaine      . methylPREDNISolone (SOLU-MEDROL) injection  60 mg Intravenous Once  . midazolam      . midazolam      . multivitamin  1 tablet Oral Daily  . nitroGLYCERIN      . pantoprazole sodium  20 mg Per Tube Q1200  . sodium chloride  3 mL Intravenous Q12H  . Ticagrelor  90 mg Oral BID  . DISCONTD: sodium chloride  3 mL Intravenous Q12H       . sodium chloride 10 mL/hr (01/11/12 0112)  . dextrose    . dextrose 5 % and 0.45% NaCl 10 mL/hr (01/11/12 0158)  . fentaNYL infusion INTRAVENOUS Stopped (01/11/12 [id number])  . DISCONTD: heparin Stopped (01/10/12 1436)  . DISCONTD: nitroGLYCERIN 10 mcg/min (01/10/12 1234)  . DISCONTD: propofol Stopped (01/10/12 1142)    Assessment/Plan:  CAD:  S/P stenting of ostial RCA  On ticagrelor.  EF normal Telemetry and ECG stable Resp:  Sedated fentanyl turned off weaning per CCM  [employer] 01/11/2012, 8:10 AM

## 2012-01-12 ENCOUNTER — Inpatient Hospital Stay (HOSPITAL_COMMUNITY): Payer: Medicare Other

## 2012-01-12 DIAGNOSIS — N186 End stage renal disease: Secondary | ICD-10-CM

## 2012-01-12 DIAGNOSIS — I200 Unstable angina: Secondary | ICD-10-CM

## 2012-01-12 DIAGNOSIS — I2510 Atherosclerotic heart disease of native coronary artery without angina pectoris: Secondary | ICD-10-CM

## 2012-01-12 LAB — BASIC METABOLIC PANEL
BUN: 74 mg/dL — ABNORMAL HIGH (ref 6–23)
CO2: 24 mEq/L (ref 19–32)
Calcium: 9.5 mg/dL (ref 8.4–10.5)
Chloride: 92 mEq/L — ABNORMAL LOW (ref 96–112)
Creatinine, Ser: 9.26 mg/dL — ABNORMAL HIGH (ref 0.50–1.10)
Creatinine, Ser: 3.42 mg/dL — ABNORMAL HIGH (ref 0.50–1.10)
GFR calc non Af Amer: 13 mL/min — ABNORMAL LOW (ref >90)
Glucose, Bld: 132 mg/dL — ABNORMAL HIGH (ref 70–99)
Potassium: 6.5 mEq/L (ref 3.5–5.1)
Sodium: 139 mEq/L (ref 135–145)

## 2012-01-12 LAB — CBC
HCT: 23.8 % — ABNORMAL LOW (ref 36.0–46.0)
Hemoglobin: 7.6 g/dL — ABNORMAL LOW (ref 12.0–15.0)
MCH: 29.5 pg (ref 26.0–34.0)
MCHC: 31.9 g/dL (ref 30.0–36.0)
MCV: 92.2 fL (ref 78.0–100.0)
RDW: 17.2 % — ABNORMAL HIGH (ref 11.5–15.5)

## 2012-01-12 LAB — GLUCOSE, CAPILLARY
Glucose-Capillary: 133 mg/dL — ABNORMAL HIGH (ref 70–99)
Glucose-Capillary: 110 mg/dL — ABNORMAL HIGH (ref 70–99)
Glucose-Capillary: 104 mg/dL — ABNORMAL HIGH (ref 70–99)
Glucose-Capillary: 133 mg/dL — ABNORMAL HIGH (ref 70–99)

## 2012-01-12 LAB — CARDIAC PANEL(CRET KIN+CKTOT+MB+TROPI): Troponin I: 0.98 ng/mL (ref <0.30)

## 2012-01-12 LAB — PREPARE RBC (CROSSMATCH)

## 2012-01-12 IMAGING — CR DG CHEST 1V PORT
1 series · 1 of 1 positions shown · non-contrast
Comparison: Portable chest x-ray of [DATE]

CLINICAL DATA: Intubation

PORTABLE CHEST - 1 VIEW

[AP]
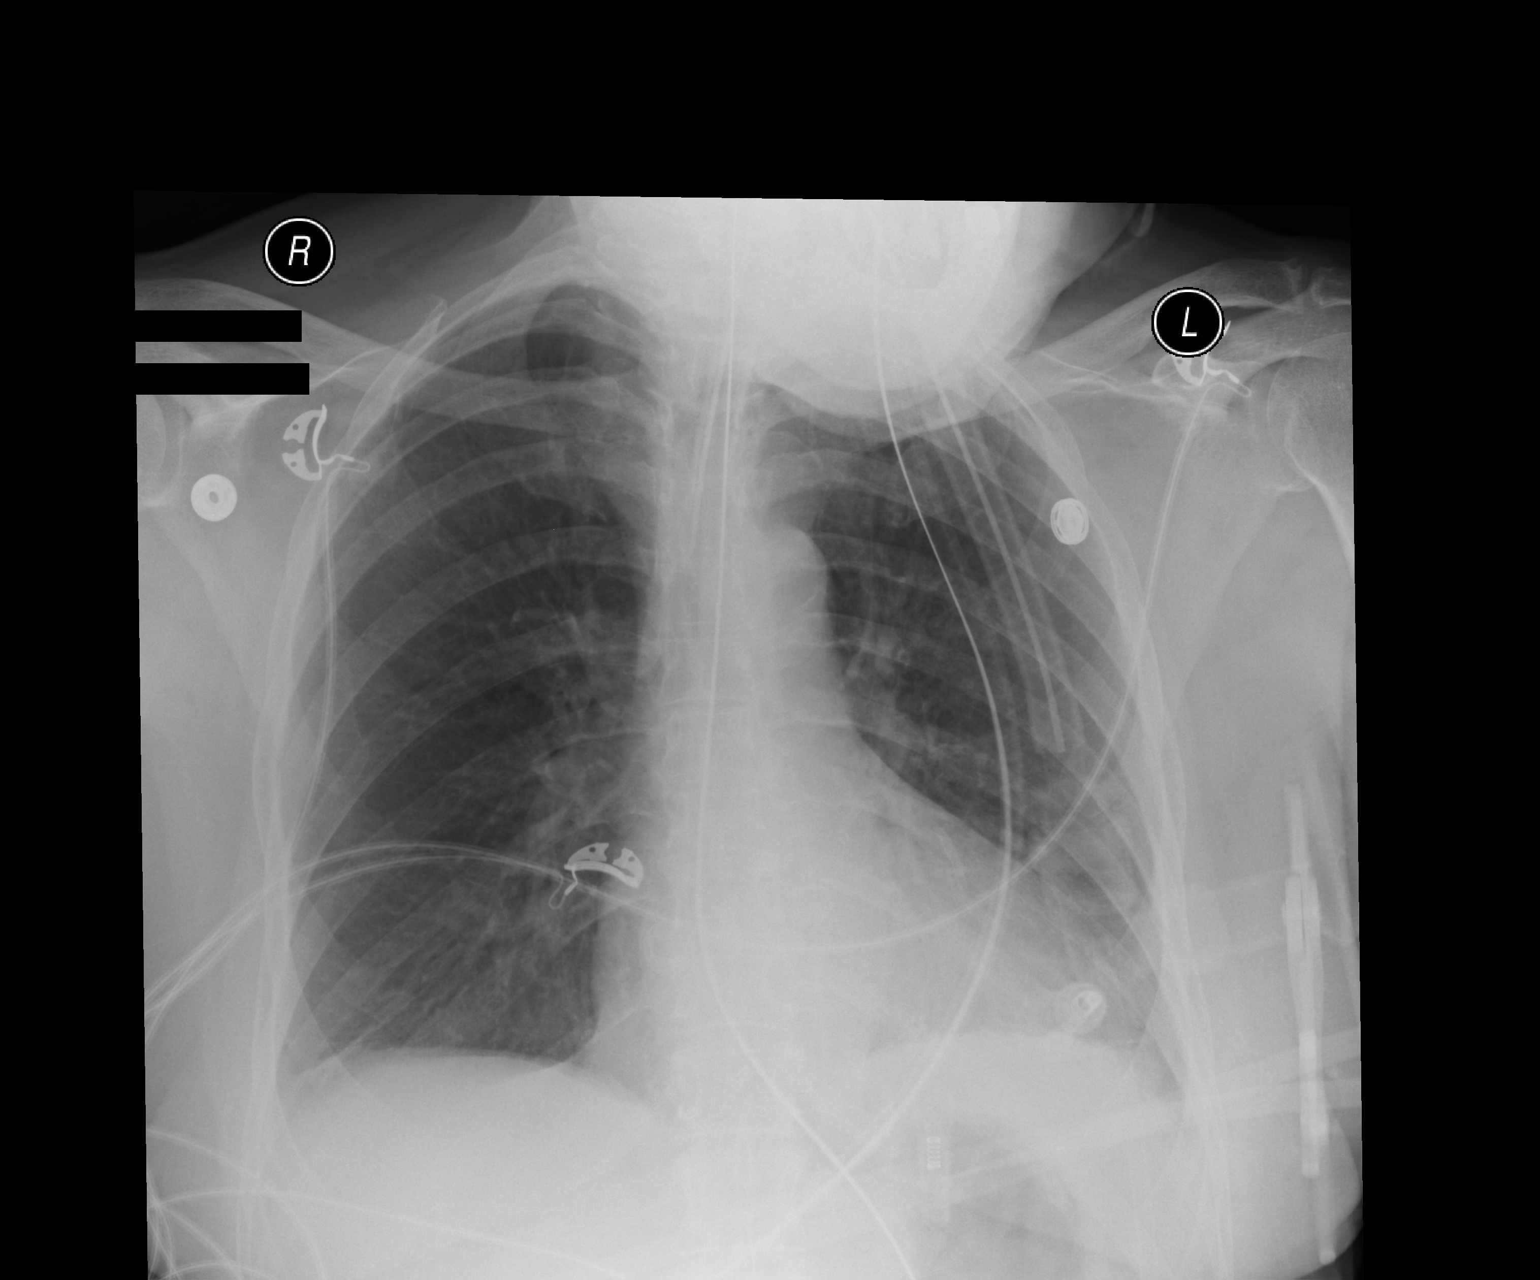

[1 of 1 positions shown; findings below may reference images not displayed]

FINDINGS: The tip of the endotracheal tube is approximately 2.9 cm
above the carina.  Only mild basilar atelectasis is present.
Cardiomegaly is stable.  An NG tube remains.
IMPRESSION: Endotracheal tube tip 2.9 cm above carina.  Stable mild
cardiomegaly.

## 2012-01-12 MED ORDER — DIPHENHYDRAMINE HCL 50 MG/ML IJ SOLN
INTRAMUSCULAR | Status: AC
  Administered 2012-01-12: 12.5 mg via INTRAVENOUS
  Filled 2012-01-12: qty 1

## 2012-01-12 MED ORDER — RENA-VITE PO TABS
1.0000 | ORAL_TABLET | Freq: Every day | ORAL | Status: DC
  Administered 2012-01-12 – 2012-01-24 (×13): 1 via ORAL
  Filled 2012-01-12 (×14): qty 1

## 2012-01-12 MED ORDER — DIPHENHYDRAMINE HCL 50 MG/ML IJ SOLN
12.5000 mg | Freq: Once | INTRAMUSCULAR | Status: AC
  Administered 2012-01-12: 12.5 mg via INTRAVENOUS

## 2012-01-12 MED ORDER — HEPARIN SODIUM (PORCINE) 1000 UNIT/ML DIALYSIS
20.0000 [IU]/kg | INTRAMUSCULAR | Status: DC | PRN
  Filled 2012-01-12: qty 2

## 2012-01-12 MED ORDER — DIPHENHYDRAMINE HCL 25 MG PO CAPS
25.0000 mg | ORAL_CAPSULE | Freq: Four times a day (QID) | ORAL | Status: DC | PRN
  Administered 2012-01-13 – 2012-01-15 (×7): 25 mg via ORAL
  Filled 2012-01-12 (×6): qty 1

## 2012-01-12 NOTE — Progress Notes (Signed)
Assessment/Plan:  1 ESRD  2 Respiratory Failure, Intubated  3 CHF, resolved  4 ASCVD s/p PCI  5. Anemia 6. Hyperkalemia Plan. Weaning. HD off schedule in progress. To get PRBCS. Low K bath  Subjective: Interval History: unable to extubate yesterday prob due to sedation  Objective: Vital signs in last 24 hours: Temp:  [97.1 F (36.2 C)-99.5 F (37.5 C)] 98.2 F (36.8 C) (07/04 0856) Pulse Rate:  [65-134] 90  (07/04 0947) Resp:  [11-21] 14  (07/04 0947) BP: (89-154)/(37-58) 133/57 mmHg (07/04 0947) SpO2:  [99 %-100 %] 100 % (07/04 0947) FiO2 (%):  [39.3 %-40.5 %] 40.3 % (07/04 0947) Weight:  [62.6 kg (138 lb 0.1 oz)-64.1 kg (141 lb 5 oz)] 62.6 kg (138 lb 0.1 oz) (07/04 0700) Weight change: -1.5 kg (-3 lb 4.9 oz)  Intake/Output from previous day: 07/03 0701 - 07/04 0700 In: 389.3 [I.V.:389.3] Out: 0  Intake/Output this shift:    more awake today rying to communicate Lungs clear anteriorly Cor RRR Abd soft Ext no edema Neuro NF   Lab Results:  Basename 01/12/12 0350 01/11/12 0430  WBC 9.5 13.8*  HGB 7.6* 8.3*  HCT 23.8* 26.2*  PLT 251 268   BMET:  Basename 01/12/12 0350 01/11/12 0430  NA 135 134*  K 6.5* 5.0  CL 92* 91*  CO2 23 22  GLUCOSE 132* 125*  BUN 74* 45*  CREATININE 9.26* 6.55*  CALCIUM 8.5 8.5   No results found for this basename: PTH:2 in the last 72 hours Iron Studies: No results found for this basename: IRON,TIBC,TRANSFERRIN,FERRITIN in the last 72 hours Studies/Results: Dg Chest Port 1 View  01/12/2012  *RADIOLOGY REPORT*  Clinical Data: Intubation  PORTABLE CHEST - 1 VIEW  Comparison: Portable chest x-ray of 01/11/2012  Findings: The tip of the endotracheal tube is approximately 2.9 cm above the carina.  Only mild basilar atelectasis is present. Cardiomegaly is stable.  An NG tube remains.  IMPRESSION: Endotracheal tube tip 2.9 cm above carina.  Stable mild cardiomegaly.  Original Report Authenticated By: Joretta Bachelor, M.D.   Dg Chest Port 1  View  01/11/2012  *RADIOLOGY REPORT*  Clinical Data: Check endotracheal tube position.  PORTABLE CHEST - 1 VIEW  Comparison: 01/10/2012.  Findings: Endotracheal tube is in satisfactory position. Nasogastric tube is followed into the stomach.  Heart size stable. There has been interval improvement in bibasilar dependent air space disease, without complete resolution.  Left pleural effusion has completely or nearly completely resolved in the interval.  IMPRESSION: Improving edema and left pleural effusion.  Original Report Authenticated By: Luretha Rued, M.D.    Scheduled:   . allopurinol  300 mg Oral Daily  . amLODipine  10 mg Oral Daily  . antiseptic oral rinse  15 mL Mouth Rinse QID  . aspirin  81 mg Oral Daily  . atorvastatin  20 mg Oral q1800  . chlorhexidine  15 mL Mouth Rinse BID  . cloNIDine  0.2 mg Oral BID  . heparin subcutaneous  5,000 Units Subcutaneous Q8H  . insulin aspart  0-3 Units Subcutaneous Q4H  . iron polysaccharides  150 mg Oral BID WC  . lanthanum  1,000 mg Oral TID WC  . multivitamin  1 tablet Oral QHS  . pantoprazole sodium  20 mg Per Tube Q1200  . sodium chloride  3 mL Intravenous Q12H  . Ticagrelor  90 mg Oral BID  . DISCONTD: fluconazole  100 mg Oral Daily  . DISCONTD: labetalol  200 mg  Oral BID  . DISCONTD: multivitamin  1 tablet Oral Daily       LOS: 3 days   Shawna [name] C 01/12/2012,10:00 AM

## 2012-01-12 NOTE — Progress Notes (Signed)
CRITICAL VALUE ALERT  Critical value received:  Potassium 6.5  Date of notification:  7/3  Time of notification:  0330  Critical value read back:no  Nurse who received alert:  Marisue Brooklyn, RN  MD notified (1st page):  Dr Deterding  Time of first page:  0330  MD notified (2nd page):  Time of second page:  Responding MD:  Dr Jimmy Footman  Time MD responded:  0330

## 2012-01-12 NOTE — Procedures (Signed)
Extubation Procedure Note  Patient Details:   Name: Shawna [name] DOB: 1941-06-24 MRN: [id number]   Airway Documentation:  Airway (Active)  Secured at (cm) 23 cm 01/12/2012 12:38 PM  Measured From Lips 01/12/2012 12:38 PM  [place] 01/12/2012 12:38 PM  Secured By [employer] 01/12/2012 12:38 PM  Tube Holder Repositioned Yes 01/12/2012 12:38 PM  Cuff Pressure (cm H2O) 24 cm H2O 01/11/2012 11:23 PM  Site Condition Dry 01/12/2012  3:11 AM    Evaluation  O2 sats: stable throughout Complications: No apparent complications Patient did tolerate procedure well. Bilateral Breath Sounds: Clear Suctioning: Airway Yes Pt extubated per Dr. Joya Gaskins. Pt placed on 4lpm [place] with Sp02 100%, Pt alert, oriented, & able to vocalize.  Cordella Register 01/12/2012, 2:20 PM

## 2012-01-12 NOTE — Progress Notes (Addendum)
Name: Shawna [name] MRN: [id number] DOB: 12/26/1940    LOS: 3 Date of admit 01/09/2012  2:25 PM   Referring Provider:  Velora Heckler cardiology Dr Salli Real and Dr Warren Danes Reason for Referral:  Acute Respiratory Failure  PULMONARY / CRITICAL CARE MEDICINE  Brief patient description:  The patient is a 71 year old female ESRD on HD, hx of ovarian and colon cancer, recent DES, admitted concerns stent thrombosis, Acute resp failure, transfer from [facility].  Events Since Admission: 7/1- transfer [facility], vent, NTG 7/2-for cath, HD 7/2- stent placed card cath, HD to neg balance pre cath 7/3-unable to wean from ventilator due to residual sedation, chest pain evaluated by cardiology 7/4-Patient more awake and alert today. Attempt vent wean after HD which is ongoing this AM.   Current Status: Patient more awake and alert today. Attempt vent wean after HD which is ongoing this AM.  Vital Signs: Temp:  [97.1 F (36.2 C)-99.5 F (37.5 C)] 98.2 F (36.8 C) (07/04 0856) Pulse Rate:  [65-134] 94  (07/04 1105) Resp:  [11-21] 14  (07/04 1105) BP: (89-174)/(37-78) 112/43 mmHg (07/04 1105) SpO2:  [99 %-100 %] 100 % (07/04 1105) FiO2 (%):  [39.3 %-40.5 %] 39.7 % (07/04 1105) Weight:  [138 lb 0.1 oz (62.6 kg)-141 lb 5 oz (64.1 kg)] 138 lb 0.1 oz (62.6 kg) (07/04 0700)  Physical Examination: General:  On ventilator, NAD Neuro:  Nonfocal, suctions her own mouth HEENT:  ET tube Neck:  Supple, No nodes Cardiovascular:  Regular rate and rhythm S1S2+. No murmur Lungs:  CTA , coarse bases Abdomen:  Soft, no mass. Normal bowel sounds Musculoskeletal:  No edema Skin:  Appears intact  Principal Problem:  *CAD (coronary artery disease) Active Problems:  HTN (hypertension)  Hyperlipidemia  ESRD (end stage renal disease)  Acute systolic CHF (congestive heart failure)  Acute respiratory failure  Acute MI  GERD (gastroesophageal reflux disease)   ASSESSMENT AND PLAN  PULMONARY  Lab 01/11/12  1502 01/10/12 1529 01/10/12 1525 01/10/12 1232 01/09/12 1628  PHART 7.447* -- 7.459* 7.495* 7.528*  PCO2ART 36.0 -- 41.2 39.0 34.2*  PO2ART 162.0* -- 521.0* 149.0* 131.0*  HCO3 24.9* 26.0* 29.2* 30.1* 28.3*  O2SAT 100.0 81.0 100.0 99.0 99.0   Ventilator Settings: Vent Mode:  [-] PRVC FiO2 (%):  [39.3 %-40.5 %] 39.7 % Set Rate:  [15 bmp] 15 bmp Vt Set:  [350 mL] 350 mL PEEP:  [5 cmH20] 5 cmH20 Plateau Pressure:  [14 cmH20-23 cmH20] 14 cmH20 CXR:  Mild edema, ETT ok  ETT:  01/08/12 at [facility] v 01/09/12 [facility] >>  A:  Acute REsp failure due to Acute Pulmonary edema, improved edema P:    -SBT once off HD later PM 7/4   CARDIOVASCULAR  Lab 01/12/12 0100 01/11/12 1701 01/10/12 0639 01/09/12 2126 01/09/12 1528 01/09/12 1517  TROPONINI 0.98* 0.59* 0.79* 1.44* 1.56* --  LATICACIDVEN -- -- -- -- -- 1.7  PROBNP -- -- -- -- 9339.0* --   ECG:   EKG showed left ventricular hypertrophy and nonspecific ST-T wave changes, although there is some ST-segment depression inferior leads.   Lines: PIV  A: ACute MI - stent thrombosis v fresh MI Acute Pulmnary edema S/p drug illuting stent 7/2 to ostial RCA  P:  -no longer with active chest pin.  Meds per [place] cards including catapres, lipitor, norvasc Asa, brilinta  -SW consult to help patient obtain her Brilinta when out of hospital.  NTG off tele  RENAL  Lab 01/12/12 0350  01/11/12 0430 01/10/12 1525 01/10/12 0430 01/09/12 1517  NA 135 134* 138 133* 136  K 6.5* 5.0 -- -- --  CL 92* 91* 100 89* 93*  CO2 23 22 -- 24 25  BUN 74* 45* 24* 56* 40*  CREATININE 9.26* 6.55* 5.10* 9.58* 7.94*  CALCIUM 8.5 8.5 -- 8.6 8.7  MG -- -- -- -- 1.4*  PHOS -- -- -- -- 3.7   Intake/Output      07/03 0701 - 07/04 0700 07/04 0701 - 07/05 0700   I.V. (mL/kg) 414.3 (6.6) 75 (1.2)   Total Intake(mL/kg) 414.3 (6.6) 75 (1.2)   Urine (mL/kg/hr)     Other     Total Output 0    Net +414.3 +75         Foley:  Dc 7/3  A:  ESRD P:   Hd today.    limit volume. Patient net positive yesterday.   GASTROINTESTINAL  Lab 01/11/12 0430 01/09/12 1517  AST 18 32  ALT 10 14  ALKPHOS 60 76  BILITOT 0.3 0.5  PROT 6.2 6.2  ALBUMIN 3.1* 3.1*    A:  NPO P:   ppi Maintain npo,  HEMATOLOGIC  Lab 01/12/12 0350 01/11/12 0430 01/10/12 1525 01/10/12 1431 01/10/12 0430 01/09/12 1517  HGB 7.6* 8.3* 10.5* 8.4* 8.7* --  HCT 23.8* 26.2* 31.0* 26.4* 27.6* --  PLT 251 268 -- 299 288 325  INR -- -- -- -- 1.24 --  APTT -- -- -- -- -- --   A:  Anemia in setting ischemia P:  -transfusion today on HD one unit PRBC Asa Cbc in am Full heparin off, SQ heparin for DVT ppx  INFECTIOUS  Lab 01/12/12 0350 01/11/12 0430 01/10/12 1431 01/10/12 0430 01/09/12 1517  WBC 9.5 13.8* 12.4* 13.4* 13.5*  PROCALCITON -- -- -- -- --   Cultures: none Antibiotics: none  A:  Chronic diflucan home med P:   Stopped diflucan  ENDOCRINE  Lab 01/12/12 0749 01/12/12 0425 01/12/12 0021 01/11/12 2033 01/11/12 1638  GLUCAP 110* 133* 132* 119* 92   A:  none   P:   icu hyperglycemia, well controlled  NEUROLOGIC  A:  Oversedation? P:   Plan to Dc all sedation , observe  BEST PRACTICE / DISPOSITION Level of Care:  ICU Primary Service:  cards Consultants:  pccm Code Status:  full Diet:  npo DVT Px:  Therapeutic anticog for mi GI Px:  ppi Skin Integrity:  intact Social / Family:  Gabriel Carina, MD, PGY2 01/12/2012 11:09 AM   CC 10min I have seen and examined this patient with the nurse practionner and agree with the above assessment and plan.  I have made notations to the exceptions.  Asencion Noble Beeper  [phone]  Cell  [phone]  If no response or cell goes to voicemail, call beeper [phone]

## 2012-01-12 NOTE — Progress Notes (Signed)
Primary cardiologist: Dr. Dannielle Burn  Patient Name: Shawna [name] Date of Encounter: 01/12/2012  Principal Problem:  *CAD (coronary artery disease) Active Problems:  HTN (hypertension)  Hyperlipidemia  ESRD (end stage renal disease)  Acute systolic CHF (congestive heart failure)  Acute respiratory failure  Acute MI  GERD (gastroesophageal reflux disease)    SUBJECTIVE: A&O on vent. Had chest pain last pm, resolved. SOB improving with HD.   OBJECTIVE Filed Vitals:   01/12/12 0735 01/12/12 0740 01/12/12 0802 01/12/12 0815  BP:  122/50 149/50 139/53  Pulse: 77 73 69 75  Temp:      TempSrc:      Resp: 20 16 20 18   Height:      Weight:      SpO2: 100% 100% 100% 100%    Intake/Output Summary (Last 24 hours) at 01/12/12 0826 Last data filed at 01/12/12 0500  Gross per 24 hour  Intake 379.25 ml  Output      0 ml  Net 379.25 ml   Weight change: -3 lb 4.9 oz (-1.5 kg) Filed Weights   01/11/12 0400 01/12/12 0500 01/12/12 0700  Weight: 138 lb 3.7 oz (62.7 kg) 141 lb 5 oz (64.1 kg) 138 lb 0.1 oz (62.6 kg)     PHYSICAL EXAM General: Well developed, well nourished, female in no acute distress. Head: Normocephalic, atraumatic.  Neck: Supple without bruits, JVD not significantly elevated (diff to assess secondary to equipement). Lungs:  Resp regular and unlabored, clear anteriorly. Heart: RRR, S1, S2, no S3, S4, no clinically significant murmur. Abdomen: Soft, non-tender, non-distended, BS + x 4.  Extremities: No clubbing, cyanosis, no edema.  Neuro: Alert and oriented X 3. Moves all extremities spontaneously. Psych: Normal affect.  LABS: CBC: Basename 01/12/12 0350 01/11/12 0430  WBC 9.5 13.8*  NEUTROABS -- 11.3*  HGB 7.6* 8.3*  HCT 23.8* 26.2*  MCV 92.2 93.6  PLT 251 268   INR: Basename 01/10/12 0430  INR 0000000   Basic Metabolic Panel: Basename [id number] 0350 01/11/12 0430 01/09/12 1517  NA 135 134* --  K 6.5* 5.0 --  CL 92* 91* --  CO2 23 22 --  GLUCOSE 132*  125* --  BUN 74* 45* --  CREATININE 9.26* 6.55* --  CALCIUM 8.5 8.5 --  MG -- -- 1.4*  PHOS -- -- 3.7   Liver Function Tests: Basename 01/11/12 0430 01/09/12 1517  AST 18 32  ALT 10 14  ALKPHOS 60 76  BILITOT 0.3 0.5  PROT 6.2 6.2  ALBUMIN 3.1* 3.1*   Cardiac Enzymes: Basename 01/12/12 0100 01/11/12 1701 01/10/12 0639  CKTOTAL 224* 208* 122  CKMB 5.2* 6.7* 3.3  CKMBINDEX -- -- --  TROPONINI 0.98* 0.59* 0.79*   BNP: Pro B Natriuretic peptide (BNP)  Date/Time Value Range Status  01/09/2012  3:28 PM 9339.0* 0 - 125 pg/mL Final   Fasting Lipid Panel: Basename 01/12/12 0350  CHOL --  HDL --  LDLCALC --  TRIG 133  CHOLHDL --  LDLDIRECT --   TELE:  SR, occ > 100, rare PACs      Radiology/Studies: Dg Chest Port 1 View 01/11/2012  *RADIOLOGY REPORT*  Clinical Data: Check endotracheal tube position.  PORTABLE CHEST - 1 VIEW  Comparison: 01/10/2012.  Findings: Endotracheal tube is in satisfactory position. Nasogastric tube is followed into the stomach.  Heart size stable. There has been interval improvement in bibasilar dependent air space disease, without complete resolution.  Left pleural effusion has completely or nearly completely resolved in  the interval.  IMPRESSION: Improving edema and left pleural effusion.  Original Report Authenticated By: Luretha Rued, M.D.   Current Medications:    . allopurinol  300 mg Oral Daily  . amLODipine  10 mg Oral Daily  . antiseptic oral rinse  15 mL Mouth Rinse QID  . aspirin  81 mg Oral Daily  . atorvastatin  20 mg Oral q1800  . chlorhexidine  15 mL Mouth Rinse BID  . cloNIDine  0.2 mg Oral BID  . heparin subcutaneous  5,000 Units Subcutaneous Q8H  . insulin aspart  0-3 Units Subcutaneous Q4H  . iron polysaccharides  150 mg Oral BID WC  . lanthanum  1,000 mg Oral TID WC  . multivitamin  1 tablet Oral Daily  . pantoprazole sodium  20 mg Per Tube Q1200  . sodium chloride  3 mL Intravenous Q12H  . Ticagrelor  90 mg Oral BID  .  DISCONTD: fluconazole  100 mg Oral Daily  . DISCONTD: labetalol  200 mg Oral BID      . sodium chloride 10 mL/hr at 01/11/12 2130  . dextrose    . dextrose 5 % and 0.45% NaCl 10 mL/hr (01/11/12 0158)  . fentaNYL infusion INTRAVENOUS 50 mcg/hr (01/11/12 2009)    ASSESSMENT AND PLAN: Principal Problem:  *CAD (coronary artery disease)/NSTEMI - S/P 4.0 x 12 mm Promus stent to ostial RCA for ISR. Plavix allergy so that was stopped. Given Rx for Brilinta at office visit on 6/27 but never got it filled. On ticagrelor now. Will get 30 days free from manufacturer, will need assistance form filled out as well. EF normal.  CCM/renal team are managing other problems. Active Problems:  HTN (hypertension)  Hyperlipidemia - on statin  ESRD (end stage renal disease)  Acute systolic CHF (congestive heart failure)  Acute respiratory failure  GERD (gastroesophageal reflux disease)  Signed, Rosaria Ferries , PA-C 8:26 AM 01/12/2012   Attending note:  Patient seen and examined. She remains on ventilator with CCM and Nephrology care. SBP stable in 120-130 range, HR 70s. She does not indicate any active chest pain. Lungs clear, cor with RRR.  On HD now with creatinine 9.2, potassium 6.5, Hgb 7.6, platelets 251. On ASA, Brilinta, catapres, Lipitor, Norvasc.  Status post DES ostial RCA on 7/2 with otherwise patent stent sites. Continue supportive care.  Satira Sark, M.D., F.A.C.C.

## 2012-01-13 LAB — GLUCOSE, CAPILLARY
Glucose-Capillary: 102 mg/dL — ABNORMAL HIGH (ref 70–99)
Glucose-Capillary: 105 mg/dL — ABNORMAL HIGH (ref 70–99)
Glucose-Capillary: 165 mg/dL — ABNORMAL HIGH (ref 70–99)

## 2012-01-13 LAB — CBC
MCH: 29.2 pg (ref 26.0–34.0)
Platelets: 305 10*3/uL (ref 150–400)
RBC: 3.56 MIL/uL — ABNORMAL LOW (ref 3.87–5.11)
WBC: 8.3 10*3/uL (ref 4.0–10.5)

## 2012-01-13 LAB — BASIC METABOLIC PANEL
Calcium: 9.6 mg/dL (ref 8.4–10.5)
GFR calc non Af Amer: 7 mL/min — ABNORMAL LOW (ref >90)
Glucose, Bld: 125 mg/dL — ABNORMAL HIGH (ref 70–99)
Sodium: 142 mEq/L (ref 135–145)

## 2012-01-13 MED ORDER — HEPARIN SODIUM (PORCINE) 1000 UNIT/ML DIALYSIS
20.0000 [IU]/kg | INTRAMUSCULAR | Status: DC | PRN
  Filled 2012-01-13: qty 2

## 2012-01-13 MED ORDER — MENTHOL 3 MG MT LOZG
1.0000 | LOZENGE | OROMUCOSAL | Status: DC | PRN
  Administered 2012-01-13 – 2012-01-24 (×3): 3 mg via ORAL
  Filled 2012-01-13 (×4): qty 9

## 2012-01-13 MED ORDER — CAMPHOR-MENTHOL 0.5-0.5 % EX LOTN
TOPICAL_LOTION | CUTANEOUS | Status: DC | PRN
  Administered 2012-01-13: 05:00:00 via TOPICAL
  Filled 2012-01-13: qty 222

## 2012-01-13 MED ORDER — METOPROLOL TARTRATE 12.5 MG HALF TABLET
12.5000 mg | ORAL_TABLET | Freq: Two times a day (BID) | ORAL | Status: DC
  Administered 2012-01-13 – 2012-01-19 (×14): 12.5 mg via ORAL
  Filled 2012-01-13 (×16): qty 1

## 2012-01-13 MED ORDER — CLONIDINE HCL 0.1 MG PO TABS
0.1000 mg | ORAL_TABLET | Freq: Two times a day (BID) | ORAL | Status: DC
  Administered 2012-01-13 – 2012-01-15 (×6): 0.1 mg via ORAL
  Filled 2012-01-13 (×8): qty 1

## 2012-01-13 NOTE — Progress Notes (Signed)
Pt called said she is starting to feel itchy again. 1 hive noted upper Lt leg Pt denies any tightness in chest or S.O.B. Medicated with Benadryl 25 mg po. T Shulem Mader RN Pt had received approx 1 hr before complant with iron complex which she says she has taken before and Lipitor. Says she does not take an anticholesterol med at home. Pt did start on Lipitor on 7/4

## 2012-01-13 NOTE — Care Management Note (Addendum)
Page 1 of 1   01/20/2012     2:58:05 PM   CARE MANAGEMENT NOTE 01/20/2012  Patient:  Shawna [name], Shawna [name]   Account Number:  [id number]  Date Initiated:  01/10/2012  Documentation initiated by:  [facility]  Subjective/Objective Assessment:   Resp failure - tx from outside hospital  Has spouse     Action/Plan:   Anticipated DC Date:  01/18/2012   Anticipated DC Plan:  [place]  CM consult      [facility] Choice  [place]   Choice offered to / List presented to:  C-1 Patient   DME arranged  [facility]      DME agency  [place] arranged  HH-1 RN      [facility] agency  OTHER - SEE NOTE   Status of service:  Completed, signed off Medicare Important Message given?   (If response is "NO", the following Medicare IM given date fields will be blank) Date Medicare IM given:   Date Additional Medicare IM given:    Discharge Disposition:  [place]  Per UR Regulation:  Reviewed for med. necessity/level of care/duration of stay  If discussed at [place] of Stay Meetings, dates discussed:   01/17/2012  01/19/2012    Comments:  Contact:  Shawna [name],Shawna [name] Spouse [place]                 Shawna [name],Shawna [name] Daughter [phone] 01/20/11 14:55p debbie Olene Godfrey rn,bsn spoke w Lexine Baton at [employer]. they are aware pt still in hosp and poss dc over weekend. if needed phone # [phone] and fax [phone].   7/10 10:17a debbie Arhum Peeples rn,bsn spoke w pt and husb. they have used 30day free card for brilinta. getting sec to ck on copay amt. also gave them 2 different cards that help w brand name copays.has 44.00 copay for brilinta.  7/9 14:40p debbie Tyquasia Pant rn,bsn [id number] spoke w pt. she knows that tub seat not covered but would like to talk w [facility] rep about cost of this eq, darian w [facility] to ck w pt. lives in [place]. called several  agencies to find one w nse and phy ther if needed. [facility] home health [phone] has serv avail. faxed order for hhrn.   7/8 12:25p debbie Clennon Nasca rn,bsn [id number] adj meds.  01-13-12 2:45pm Luz Lex, RNBSN [phone] Verified that [name] does have the Brilinta medication for wife.  It is still in stapled white bag and has never been opened.  The reciept shows that this was a free prescription using the Brilinta cards. Again stress the importance of taking this medication as prescribed on discharage.  Have two daughter, one in [place] and one in [place] but only about 57minutes from them.  May benefit from RN to follow to make sure taking meds correctly and patient states may need shower chair on discharge.  Will need order for both on discharge.  01-12-12 10:30am Stanton Kidney [id number] Had long discussion about Brilinta, importance of taking, how to get it, using discount cards, ect.  At end of discussion husband stated he believed he had already received these cards prior and had just gotten the med picked up on Sunday - day she came in.  She had not taken any of it.  Asked to please bring medication  in, so I could verify that they do have the correct medication.

## 2012-01-13 NOTE — Progress Notes (Signed)
Name: Shawna [name] MRN: [id number] DOB: Dec 10, 1940    LOS: 4 Date of admit 01/09/2012  2:25 PM   Referring Provider:  Velora Heckler cardiology Dr Salli Real and Dr Warren Danes Reason for Referral:  Acute Respiratory Failure  PULMONARY / CRITICAL CARE MEDICINE  Brief patient description:  The patient is a 71 year old female ESRD on HD, hx of ovarian and colon cancer, recent DES, admitted concerns stent thrombosis, Acute resp failure, transfer from [facility].  Events Since Admission: 7/1- transfer [facility], vent, NTG 7/2-for cath, HD 7/2- stent placed card cath, HD to neg balance pre cath 7/3-unable to wean from ventilator due to residual sedation, chest pain evaluated by cardiology 7/4-Patient more awake and alert. Successful Extubation. Received 1 units PRBC. HD.    Current Status: Patient s/p successful extubation. Did have  hives yesterday before blood tx which worsened with tx.  she was given benadryl which has improved. Complaining of difficulty swallowing and speaking after extubation.   Vital Signs: Temp:  [97.1 F (36.2 C)-98.3 F (36.8 C)] 98.1 F (36.7 C) (07/05 0441) Pulse Rate:  [65-119] 69  (07/05 0600) Resp:  [14-24] 16  (07/05 0600) BP: (72-176)/(37-78) 107/38 mmHg (07/05 0600) SpO2:  [92 %-100 %] 93 % (07/05 0600) FiO2 (%):  [0 %-40.5 %] 0 % (07/04 1420) Weight:  [131 lb 6.3 oz (59.6 kg)-138 lb 0.1 oz (62.6 kg)] 132 lb 4.4 oz (60 kg) (07/05 0441)  Physical Examination: General:  NAD, resting in bed.  Neuro:  Alert and oriented x3, moves all extremities.  Neck:  Supple, No nodes Cardiovascular:  Regular rate and rhythm S1S2+. No murmur Lungs:  CTAB with exception of coarse bases Abdomen:  Soft, no mass. Normal bowel sounds Musculoskeletal:  No edema Skin:  Hives noted on bilateral thighs nonerythematous not expanding to lower legs or abdomen, erythema noted on bilateral arms.   Principal Problem:  *CAD (coronary artery disease) Active Problems:  HTN  (hypertension)  Hyperlipidemia  ESRD (end stage renal disease)  Acute systolic CHF (congestive heart failure)  Acute respiratory failure  Acute MI  GERD (gastroesophageal reflux disease)   ASSESSMENT AND PLAN  PULMONARY  Lab 01/11/12 1502 01/10/12 1529 01/10/12 1525 01/10/12 1232 01/09/12 1628  PHART 7.447* -- 7.459* 7.495* 7.528*  PCO2ART 36.0 -- 41.2 39.0 34.2*  PO2ART 162.0* -- 521.0* 149.0* 131.0*  HCO3 24.9* 26.0* 29.2* 30.1* 28.3*  O2SAT 100.0 81.0 100.0 99.0 99.0   Ventilator Settings: Vent Mode:  [-] PRVC FiO2 (%):  [0 %-40.5 %] 0 % Set Rate:  [15 bmp] 15 bmp Vt Set:  [350 mL] 350 mL PEEP:  [5 cmH20] 5 cmH20 Plateau Pressure:  [14 cmH20-15 cmH20] 15 cmH20 CXR:  Mild edema, ETT ok  ETT:  01/08/12 at [facility] v 01/09/12 [place] [facility] >>  A:  Acute REsp failure due to Acute Pulmonary edema, improved edema P:   -s/p extubation.  -no oxygen requirement -IS  CARDIOVASCULAR  Lab 01/12/12 0100 01/11/12 1701 01/10/12 0639 01/09/12 2126 01/09/12 1528 01/09/12 1517  TROPONINI 0.98* 0.59* 0.79* 1.44* 1.56* --  LATICACIDVEN -- -- -- -- -- 1.7  PROBNP -- -- -- -- 9339.0* --   ECG:   EKG showed left ventricular hypertrophy and nonspecific ST-T wave changes, although there is some ST-segment depression inferior leads.   Lines: PIV  A: ACute MI - stent thrombosis v fresh MI Acute Pulmnary edema S/p drug illuting stent 7/2 to ostial RCA  P:  -no chest pain reported -Asa, brilinta  -SW  consult to help patient obtain her Brilinta when out of hospital.  -Meds per  cards including catapres, lipitor, norvasc -did have 2 MAPs o/n <60 but improved to 73 this AM, could consider holding norvasc or reducing catapres if further lows noted.  -believe patient can be transferred to telemetry today.  -may be candidate for cardiac rehab -tele  ARE THE NEW HIVES ALSO from brilinta?? Will d/w cards, did have rash on plavix?  RENAL  Lab 01/13/12 0430 01/12/12 1320 01/12/12 0350  01/11/12 0430 01/10/12 1525 01/10/12 0430 01/09/12 1517  NA 142 139 135 134* 138 -- --  K 4.5 3.9 -- -- -- -- --  CL 94* 93* 92* 91* 100 -- --  CO2 28 24 23 22  -- 24 --  BUN 42* 21 74* 45* 24* -- --  CREATININE 5.75* 3.42* 9.26* 6.55* 5.10* -- --  CALCIUM 9.6 9.5 8.5 8.5 -- 8.6 --  MG -- -- -- -- -- -- 1.4*  PHOS -- -- -- -- -- -- 3.7   Intake/Output      07/04 0701 - 07/05 0700   I.V. (mL/kg) 435 (7.3)   Blood 362.5   Total Intake(mL/kg) 797.5 (13.3)   Other 3001   Total Output 3001   Net -2203.5        Foley:  Dc 7/3  A:  ESRD P:   Hd 7/4  limit volume. Patient net negative 2.2 L with dialysis yesterday. Weight down 15 lbs since peak on 7/2.  Hyperkalemia resolved yesterday after HD.   GASTROINTESTINAL  Lab 01/11/12 0430 01/09/12 1517  AST 18 32  ALT 10 14  ALKPHOS 60 76  BILITOT 0.3 0.5  PROT 6.2 6.2  ALBUMIN 3.1* 3.1*    A:  NPO P:   -diet -ppi  HEMATOLOGIC  Lab 01/13/12 0430 01/12/12 0350 01/11/12 0430 01/10/12 1525 01/10/12 1431 01/10/12 0430  HGB 10.4* 7.6* 8.3* 10.5* 8.4* --  HCT 32.1* 23.8* 26.2* 31.0* 26.4* --  PLT 305 251 268 -- 299 288  INR -- -- -- -- -- 1.24  APTT -- -- -- -- -- --   A:  Anemia in setting ischemia P:  -hgb improved to 10.4 with 1 unit PRBC and HD -Asa -Cbc in am Full heparin off, SQ heparin for DVT ppx  INFECTIOUS  Lab 01/13/12 0430 01/12/12 0350 01/11/12 0430 01/10/12 1431 01/10/12 0430  WBC 8.3 9.5 13.8* 12.4* 13.4*  PROCALCITON -- -- -- -- --   Cultures: none Antibiotics: none  A:  Chronic diflucan home med P:   Stopped diflucan 7/4, was unclear on etiology of this  ENDOCRINE  Lab 01/13/12 0409 01/13/12 0013 01/12/12 2001 01/12/12 1623 01/12/12 1245  GLUCAP 102* 153* 133* 104* 108*   A:  none   P:   -icu hyperglycemia, well controlled -no insulin requirement-will discontinue protocol on transfer  NEUROLOGIC  A:  Oversedation-resolved P:   No sedation  BEST PRACTICE / DISPOSITION Level of  Care:  ICU--> to telemetry vs SDU today Primary Service:  cards Consultants:  pccm Code Status:  full Diet:  npo DVT Px:  Therapeutic anticog for mi GI Px:  ppi Skin Integrity:  intact Social / Family:  Gabriel Carina, MD, PGY2 01/13/2012 6:51 AM   Will sign off , call if needed  [name]. Titus Mould, MD, [place] Pgr: [facility]

## 2012-01-13 NOTE — Evaluation (Signed)
Physical Therapy Evaluation Patient Details Name: Shawna [name] MRN: [id number] DOB: [birth year] Today's Date: 01/13/2012 Time: [id number] PT Time Calculation (min): 17 min  PT Assessment / Plan / Recommendation Clinical Impression  Patient s/p CAD with stent with VDRF with decr mobility secondary to deconditioning after being on bedrest for a few days.  Will benefit from PT to address balance and endurance issues.  May need Rehab for short stay prior to d/c home with husband.    PT Assessment  Patient needs continued PT services    Follow Up Recommendations  Inpatient Rehab;Supervision/Assistance - 24 hour    Barriers to Discharge        Equipment Recommendations  Defer to next venue    Recommendations for Other Services OT consult   Frequency Min 3X/week    Precautions / Restrictions Precautions Precautions: Fall Restrictions Weight Bearing Restrictions: No   Pertinent Vitals/Pain VSS, no pain      Mobility  Bed Mobility Bed Mobility: Not assessed Transfers Transfers: Sit to Stand;Stand to Sit Sit to Stand: 3: Mod assist;With upper extremity assist;With armrests;From chair/3-in-1 Stand to Sit: 3: Mod assist;With armrests;To chair/3-in-1;With upper extremity assist Details for Transfer Assistance: Patient performed sit to stand with cues for hand placement.  Had difficulty achieving anterior translation of pelvis.  PAtient's knees were buckling and patient could not achieve upright posture.   Ambulation/Gait Ambulation/Gait Assistance: Not tested (comment) Stairs: No Wheelchair Mobility Wheelchair Mobility: No    Exercises General Exercises - Lower Extremity Long Arc Quad: AROM;5 reps;Seated;Both Hip Flexion/Marching: AROM;Both;5 reps;Seated   PT Diagnosis: Generalized weakness  PT Problem List: Decreased activity tolerance;Decreased balance;Decreased mobility;Decreased safety awareness;Decreased knowledge of use of DME;Cardiopulmonary status limiting activity PT  Treatment Interventions: DME instruction;Gait training;Functional mobility training;Therapeutic activities;Therapeutic exercise;Balance training;Patient/family education   PT Goals Acute Rehab PT Goals PT Goal Formulation: With patient Time For Goal Achievement: 01/27/12 Potential to Achieve Goals: Good Pt will go Supine/Side to Sit: Independently PT Goal: Supine/Side to Sit - Progress: Goal set today Pt will go Sit to Stand: Independently;without upper extremity assist PT Goal: Sit to Stand - Progress: Goal set today Pt will Transfer Bed to Chair/Chair to Bed: Independently PT Transfer Goal: Bed to Chair/Chair to Bed - Progress: Goal set today Pt will Ambulate: 51 - 150 feet;with supervision;with least restrictive assistive device PT Goal: Ambulate - Progress: Goal set today  Visit Information  Last PT Received On: 01/13/12 Assistance Needed: +2    Subjective Data  Subjective: "My head won't do what my body is doing." Patient Stated Goal: to get home   Prior [place] Lives With: Spouse Available Help at Discharge: Family;Available 24 hours/day Type of Home: House Home Access: Stairs to enter [employer] of Steps: 2 Entrance Stairs-Rails: Right Home Layout: One level Bathroom Shower/Tub: Tub/shower unit;Curtain Biochemist, clinical: Standard Home Adaptive Equipment: Environmental consultant - rolling Prior Function Level of Independence: Independent Able to Take Stairs?: Yes Driving: Yes Vocation: Retired Corporate investment banker: No difficulties Dominant Hand: Right    Cognition  Overall Cognitive Status: Appears within functional limits for tasks assessed/performed Arousal/Alertness: Awake/alert Orientation Level: Appears intact for tasks assessed Behavior During Session: [facility] for tasks performed    Extremity/Trunk Assessment Right Upper Extremity Assessment RUE ROM/Strength/Tone: WFL for tasks assessed RUE Sensation: WFL - Light Touch RUE Coordination:  WFL - gross/fine motor Left Upper Extremity Assessment LUE ROM/Strength/Tone: WFL for tasks assessed LUE Sensation: WFL - Light Touch LUE Coordination: WFL - gross/fine motor Right Lower Extremity Assessment RLE ROM/Strength/Tone:  WFL for tasks assessed RLE Sensation: WFL - Light Touch RLE Coordination: WFL - gross/fine motor Left Lower Extremity Assessment LLE ROM/Strength/Tone: WFL for tasks assessed LLE Sensation: WFL - Light Touch LLE Coordination: WFL - gross/fine motor   Balance Balance Balance Assessed: Yes Static Standing Balance Static Standing - Balance Support: Bilateral upper extremity supported;During functional activity Static Standing - Level of Assistance: 3: Mod assist Static Standing - Comment/# of Minutes: Stood for 2 minutes.  Marched in place with patient having difficulty maintaining balance with majority of weight on her heels.  Losing balance posteriorly and knees buckling as well, needing mod assist to recover postural stability.  End of Session PT - End of Session Equipment Utilized During Treatment: Gait belt Activity Tolerance: Patient limited by fatigue Patient left: in chair;with call bell/phone within reach Nurse Communication: Mobility status       INGOLD,Ilma Achee 01/13/2012, 2:03 PM  [facility] Acute Rehabilitation [phone] (pager)

## 2012-01-13 NOTE — Progress Notes (Signed)
Primary cardiologist: Dr. Dannielle Burn  Patient Name: Shawna [name] Date of Encounter: 01/13/2012    SUBJECTIVE: Extubated; no chest pain or dyspnea; hives lower ext  OBJECTIVE Filed Vitals:   01/13/12 0400 01/13/12 0441 01/13/12 0500 01/13/12 0600  BP: 72/42  147/64 107/38  Pulse: 84  72 69  Temp:  98.1 F (36.7 C)    TempSrc:  Oral    Resp: 16  17 16   Height:      Weight:  60 kg (132 lb 4.4 oz)    SpO2: 96%  95% 93%    Intake/Output Summary (Last 24 hours) at 01/13/12 0802 Last data filed at 01/13/12 0600  Gross per 24 hour  Intake  772.5 ml  Output   3001 ml  Net -2228.5 ml   Weight change: -4.5 kg (-9 lb 14.7 oz) Filed Weights   01/12/12 0700 01/12/12 1119 01/13/12 0441  Weight: 62.6 kg (138 lb 0.1 oz) 59.6 kg (131 lb 6.3 oz) 60 kg (132 lb 4.4 oz)     PHYSICAL EXAM General: Well developed, well nourished, female in no acute distress. Head: Normal Neck: Supple  Lungs:  CTA Heart: RRR Abdomen: Soft, non-tender, non-distended, BS + x 4.  Extremities: No edema; hives over lower ext.  Neuro: Alert and oriented X 3. Moves all extremities spontaneously.   LABS: CBC:  Basename 01/13/12 0430 01/12/12 0350 01/11/12 0430  WBC 8.3 9.5 --  NEUTROABS -- -- 11.3*  HGB 10.4* 7.6* --  HCT 32.1* 23.8* --  MCV 90.2 92.2 --  PLT 305 251 --   Basic Metabolic Panel:  Basename 01/13/12 0430 01/12/12 1320  NA 142 139  K 4.5 3.9  CL 94* 93*  CO2 28 24  GLUCOSE 125* 119*  BUN 42* 21  CREATININE 5.75* 3.42*  CALCIUM 9.6 9.5  MG -- --  PHOS -- --   Liver Function Tests:  Basename 01/11/12 0430  AST 18  ALT 10  ALKPHOS 60  BILITOT 0.3  PROT 6.2  ALBUMIN 3.1*   Cardiac Enzymes:  Basename 01/12/12 0100 01/11/12 1701  CKTOTAL 224* 208*  CKMB 5.2* 6.7*  CKMBINDEX -- --  TROPONINI 0.98* 0.59*   BNP: Pro B Natriuretic peptide (BNP)  Date/Time Value Range Status  01/09/2012  3:28 PM 9339.0* 0 - 125 pg/mL Final   Fasting Lipid Panel:  Basename 01/12/12 0350    CHOL --  HDL --  LDLCALC --  TRIG 133  CHOLHDL --  LDLDIRECT --   TELE:  SR, occ > 100, rare PACs      Radiology/Studies: Dg Chest Port 1 View 01/11/2012  *RADIOLOGY REPORT*  Clinical Data: Check endotracheal tube position.  PORTABLE CHEST - 1 VIEW  Comparison: 01/10/2012.  Findings: Endotracheal tube is in satisfactory position. Nasogastric tube is followed into the stomach.  Heart size stable. There has been interval improvement in bibasilar dependent air space disease, without complete resolution.  Left pleural effusion has completely or nearly completely resolved in the interval.  IMPRESSION: Improving edema and left pleural effusion.  Original Report Authenticated By: Luretha Rued, M.D.   ASSESSMENT AND PLAN: Principal Problem:  *CAD (coronary artery disease)/NSTEMI - S/P 4.0 x 12 mm Promus stent to ostial RCA for ISR. Plavix allergy so that was stopped. Given Rx for Brilinta at office visit on 6/27 but never got it filled. On ticagrelor now. Will get 30 days free from manufacturer, will need assistance form filled out as well. EF normal. Given recent MI, would  wean clonidine to off and treat with metoprolol; continue ASA and brilinta. Patient is having hives over lower ext; however, I would be hesitant to hold brilinta given recent stent thrombosis; benadryl as needed and follow; continue statin.  ESRD-per nephrology      Signed, Kirk Ruths , MD 8:02 AM 01/13/2012

## 2012-01-13 NOTE — Progress Notes (Signed)
Clinical Social Worker received referral requesting medication assistance for pt. RNCM aware and already workign with pt and spouse.  CSW not to sign on at this time as no other CSW needs identified.   Dala Dock, MSW, [place]

## 2012-01-13 NOTE — Progress Notes (Signed)
Pt seen by cardiac rehab and had significant problems with ambulation. She was not hypotensive or hypoglycemic and her heart rate was OK. She complained of weakness, only. Will ask PT to see.

## 2012-01-13 NOTE — Progress Notes (Signed)
Assessment/Plan:  1 ESRD  2 s/p Respiratory Failure 3 CHF, resolved  4 ASCVD s/p PCI  5. Anemia s/p PRBCs 6. Hyperkalemia  Plan. Weaning. HD MWF, off schedule next treatment Sat.    Subjective: Interval History: Extubated. No memory of events.  Objective: Vital signs in last 24 hours: Temp:  [97.4 F (36.3 C)-98.3 F (36.8 C)] 98.1 F (36.7 C) (07/05 0441) Pulse Rate:  [69-119] 69  (07/05 0600) Resp:  [14-24] 16  (07/05 0600) BP: (72-176)/(37-78) 107/38 mmHg (07/05 0600) SpO2:  [92 %-100 %] 93 % (07/05 0600) FiO2 (%):  [0 %-40.4 %] 0 % (07/04 1420) Weight:  [59.6 kg (131 lb 6.3 oz)-60 kg (132 lb 4.4 oz)] 60 kg (132 lb 4.4 oz) (07/05 0441) Weight change: -4.5 kg (-9 lb 14.7 oz)  Intake/Output from previous day: 07/04 0701 - 07/05 0700 In: 797.5 [I.V.:435; Blood:362.5] Out: 3001  Intake/Output this shift:    General appearance: alert, cooperative and appears stated age Head: Normocephalic, without obvious abnormality, atraumatic Resp: clear to auscultation bilaterally Chest wall: no tenderness Extremities: extremities normal, atraumatic, no cyanosis or edema AVF lue functioning  Lab Results:  [facility] 01/13/12 0430 01/12/12 0350  WBC 8.3 9.5  HGB 10.4* 7.6*  HCT 32.1* 23.8*  PLT 305 251   BMET:  Basename 01/13/12 0430 01/12/12 1320  NA 142 139  K 4.5 3.9  CL 94* 93*  CO2 28 24  GLUCOSE 125* 119*  BUN 42* 21  CREATININE 5.75* 3.42*  CALCIUM 9.6 9.5   No results found for this basename: PTH:2 in the last 72 hours Iron Studies: No results found for this basename: IRON,TIBC,TRANSFERRIN,FERRITIN in the last 72 hours Studies/Results: Dg Chest Port 1 View  01/12/2012  *RADIOLOGY REPORT*  Clinical Data: Intubation  PORTABLE CHEST - 1 VIEW  Comparison: Portable chest x-ray of 01/11/2012  Findings: The tip of the endotracheal tube is approximately 2.9 cm above the carina.  Only mild basilar atelectasis is present. Cardiomegaly is stable.  An NG tube remains.   IMPRESSION: Endotracheal tube tip 2.9 cm above carina.  Stable mild cardiomegaly.  Original Report Authenticated By: Joretta Bachelor, M.D.    Scheduled:   . allopurinol  300 mg Oral Daily  . amLODipine  10 mg Oral Daily  . antiseptic oral rinse  15 mL Mouth Rinse QID  . aspirin  81 mg Oral Daily  . atorvastatin  20 mg Oral q1800  . chlorhexidine  15 mL Mouth Rinse BID  . cloNIDine  0.1 mg Oral BID  . diphenhydrAMINE  12.5 mg Intravenous Once  . heparin subcutaneous  5,000 Units Subcutaneous Q8H  . insulin aspart  0-3 Units Subcutaneous Q4H  . iron polysaccharides  150 mg Oral BID WC  . lanthanum  1,000 mg Oral TID WC  . metoprolol tartrate  12.5 mg Oral BID  . multivitamin  1 tablet Oral QHS  . pantoprazole sodium  20 mg Per Tube Q1200  . sodium chloride  3 mL Intravenous Q12H  . Ticagrelor  90 mg Oral BID  . DISCONTD: cloNIDine  0.2 mg Oral BID  . DISCONTD: multivitamin  1 tablet Oral Daily      LOS: 4 days   Magnolia Mattila C 01/13/2012,8:31 AM

## 2012-01-13 NOTE — Progress Notes (Signed)
Nutrition Follow-up  Intervention:  None needed at this time.  Assessment:   Patient was extubated yesterday.  Diet advanced to Heart Healthy.  Patient reports good intake of breakfast today.  Does not want any snacks or supplements at this time.  Diet Order:  Heart Healthy  Meds: Scheduled Meds:   . allopurinol  300 mg Oral Daily  . amLODipine  10 mg Oral Daily  . antiseptic oral rinse  15 mL Mouth Rinse QID  . aspirin  81 mg Oral Daily  . atorvastatin  20 mg Oral q1800  . chlorhexidine  15 mL Mouth Rinse BID  . cloNIDine  0.1 mg Oral BID  . diphenhydrAMINE  12.5 mg Intravenous Once  . heparin subcutaneous  5,000 Units Subcutaneous Q8H  . insulin aspart  0-3 Units Subcutaneous Q4H  . iron polysaccharides  150 mg Oral BID WC  . lanthanum  1,000 mg Oral TID WC  . metoprolol tartrate  12.5 mg Oral BID  . multivitamin  1 tablet Oral QHS  . pantoprazole sodium  20 mg Per Tube Q1200  . sodium chloride  3 mL Intravenous Q12H  . Ticagrelor  90 mg Oral BID  . DISCONTD: cloNIDine  0.2 mg Oral BID   Continuous Infusions:   . sodium chloride 10 mL/hr at 01/11/12 2130  . dextrose 5 % and 0.45% NaCl 10 mL/hr (01/12/12 1728)  . DISCONTD: dextrose    . DISCONTD: fentaNYL infusion INTRAVENOUS Stopped (01/12/12 1155)   PRN Meds:.sodium chloride, acetaminophen, ALPRAZolam, camphor-menthol, diphenhydrAMINE, fentaNYL, heparin, heparin, heparin, labetalol, nitroGLYCERIN, ondansetron (ZOFRAN) IV, sodium chloride, zolpidem, DISCONTD: dextrose, DISCONTD: fentaNYL, DISCONTD: fentaNYL, DISCONTD: midazolam  Labs:  CMP     Component Value Date/Time   NA 142 01/13/2012 0430   K 4.5 01/13/2012 0430   CL 94* 01/13/2012 0430   CO2 28 01/13/2012 0430   GLUCOSE 125* 01/13/2012 0430   BUN 42* 01/13/2012 0430   CREATININE 5.75* 01/13/2012 0430   CALCIUM 9.6 01/13/2012 0430   PROT 6.2 01/11/2012 0430   ALBUMIN 3.1* 01/11/2012 0430   AST 18 01/11/2012 0430   ALT 10 01/11/2012 0430   ALKPHOS 60 01/11/2012 0430   BILITOT 0.3  01/11/2012 0430   GFRNONAA 7* 01/13/2012 0430   GFRAA 8* 01/13/2012 0430     Intake/Output Summary (Last 24 hours) at 01/13/12 1109 Last data filed at 01/13/12 0900  Gross per 24 hour  Intake  762.5 ml  Output   3001 ml  Net -2238.5 ml    Weight Status:  60 kg (down slightly with negative fluid balance)  Re-estimated needs:  1500-1700 kcals, 70-80 grams protein daily  Nutrition Dx:  Inadequate oral intake, resolved.  Goal:  Intake to meet 90-100% of estimated nutrition needs, progressing.  Suspect current intake is adequate to meet 90-100% of estimated nutrition needs.  Monitor:  PO intake, labs, weight trend.   Molli Barrows, RD, CNSC, LDN Pager# [phone] After Hours Pager# [phone]

## 2012-01-13 NOTE — Progress Notes (Signed)
CARDIAC REHAB PHASE I   PRE:  Rate/Rhythm: 78SR  BP:  Supine:   Sitting: 119/57  Standing:    SaO2: 97%RA  MODE:  Ambulation: few steps     ft   POST:  Rate/Rhythem: 79SR  BP:  Supine:   Sitting: 137/51  Standing:    SaO2: 99%RA 1120-1150 Pt attempted to walk with asst x2 and rolling walker. Pt only able to take a couple of steps. C/o head feeling funny. Legs buckled. Put gait belt on pt. Pt still said felt as if she was on cloud and head felt weird. Sat pt back down and took BP. 137/51. Remained in NSR at 78-79.  Generalized weakness. Would recommend PT consult. Pt left in chair with call bell.  Jeani Sow

## 2012-01-14 ENCOUNTER — Inpatient Hospital Stay (HOSPITAL_COMMUNITY): Payer: Medicare Other

## 2012-01-14 LAB — BASIC METABOLIC PANEL
BUN: 86 mg/dL — ABNORMAL HIGH (ref 6–23)
CO2: 23 mEq/L (ref 19–32)
Calcium: 9 mg/dL (ref 8.4–10.5)
Chloride: 91 mEq/L — ABNORMAL LOW (ref 96–112)
Creatinine, Ser: 8.97 mg/dL — ABNORMAL HIGH (ref 0.50–1.10)
GFR calc Af Amer: 5 mL/min — ABNORMAL LOW (ref >90)
GFR calc non Af Amer: 4 mL/min — ABNORMAL LOW (ref >90)
Glucose, Bld: 108 mg/dL — ABNORMAL HIGH (ref 70–99)
Potassium: 4.4 mEq/L (ref 3.5–5.1)
Sodium: 136 mEq/L (ref 135–145)

## 2012-01-14 LAB — GLUCOSE, CAPILLARY: Glucose-Capillary: 105 mg/dL — ABNORMAL HIGH (ref 70–99)

## 2012-01-14 MED ORDER — DIPHENHYDRAMINE HCL 25 MG PO CAPS
ORAL_CAPSULE | ORAL | Status: AC
  Administered 2012-01-14: 25 mg via ORAL
  Filled 2012-01-14: qty 1

## 2012-01-14 MED ORDER — PENTAFLUOROPROP-TETRAFLUOROETH EX AERO
INHALATION_SPRAY | CUTANEOUS | Status: AC
  Administered 2012-01-14: 07:00:00
  Filled 2012-01-14: qty 103.5

## 2012-01-14 NOTE — Progress Notes (Signed)
Nursing Note: Pt back from dialysis with no complaints. Pt assessed and Left arm AV fistula has occlusive dressing clean dry and intact. Positive for thrill and bruit. Pt given lunch tray and heart monitor applied. Family at bedside. Will continue to monitor pt. 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

## 2012-01-14 NOTE — Procedures (Signed)
Tolerating hemodialysis. BFR 400, BP156/72, goal 3200. Shawna [name] C

## 2012-01-14 NOTE — Progress Notes (Signed)
PT Cancellation Note  Treatment cancelled today due to up ambulating with Cardiac Rehab at this time.  Will return at later time for PT session.Despina Pole 01/14/2012, 4:06 PM

## 2012-01-14 NOTE — Progress Notes (Signed)
Patient Name: Shawna [name]      SUBJECTIVE: 71 year old female who was, a few weeks ago, admitted to [facility] after she ruled in for non-ST elevation myocardial infarction. She initially underwent a dobutamine stress echocardiogram, which yielded lateral ischemia. She was then referred for cardiac catheterization and was found to have triple vessel CAD with severe circumflex disease and diagonal branch disease and moderate LAD disease. She underwent successful DES of the circumflex and diagonal artery with no noted complications. Ejection fraction was 55%. No significant mitral regurgitation. The patient was placed on aspirin and Plavix; however, she developed a rash secondary to Plavix. She called in to the office and was given a prescription for Brilinta. Unfortunately, she never picked up her Brilinta.  She was readmitted to [facility] on July 1 with Acute Pulm edema>:>>intubated  and Positive troponin, felt  Though not to be stent thrombosis. Tricagelor strarted     Cath >> found to have ostial RCA>>DES stent; normal LV function  Finally extubated;   Has had jhives on lower extremities, but she says this antedates her hospitaliztion and antip;atlet drugs, responds to benadryl  Past Medical History  Diagnosis Date  . ESRD (end stage renal disease) on dialysis 12/15/11    [place]; Mon; Wed; Fri  . Hypertension   . CHF (congestive heart failure)   . Anginal pain   . Chronic bronchitis 12/15/11    "have had it last 4 wk"  . Pneumonia ~ 2010  . Anemia   . History of blood transfusion 07/2011    "first time"  . GERD (gastroesophageal reflux disease)   . History of stomach ulcers   . History of lower GI bleeding   . Arthritis     "hands and feet"  . History of gout   . Anxiety   . Ovarian cancer 1992  . Colon cancer   . CAD (coronary artery disease)     a. 12/2011 s/p DES to LCX and Diag    PHYSICAL EXAM Filed Vitals:   01/14/12 0737 01/14/12 0742 01/14/12 0800 01/14/12 0830    BP: 164/73 159/67 154/68 163/72  Pulse: 73 76 75 78  Temp:      TempSrc:      Resp: 16 16 13 15   Height:      Weight:      SpO2:        Well developed and nourished in no acute distress on dialysis HENT normal Neck supple with JVP-flat Clear Regular rate and rhythm, no murmurs or gallops Abd-soft with active BS No Clubbing cyanosis edema Skin-warm and dry; no rash  A & Oriented  Grossly normal sensory and motor function   TELEMETRY: Reviewed telemetry pt in sinus   Intake/Output Summary (Last 24 hours) at 01/14/12 0926 Last data filed at 01/14/12 0545  Gross per 24 hour  Intake    860 ml  Output      0 ml  Net    860 ml    LABS: Basic Metabolic Panel:  Lab [id number] 0730 01/13/12 0430 01/12/12 1320 01/12/12 0350 01/11/12 0430 01/10/12 1525 01/10/12 0430 01/09/12 1517  NA 136 142 139 135 134* 138 133* --  K 4.4 4.5 3.9 6.5* 5.0 3.9 3.8 --  CL 91* 94* 93* 92* 91* 100 89* --  CO2 23 28 24 23 22  -- 24 25  GLUCOSE 108* 125* 119* 132* 125* 168* 162* --  BUN 86* 42* 21 74* 45* 24* 56* --  CREATININE 8.97*  5.75* 3.42* 9.26* 6.55* 5.10* 9.58* --  CALCIUM 9.0 9.6 -- -- -- -- -- --  MG -- -- -- -- -- -- -- 1.4*  PHOS -- -- -- -- -- -- -- 3.7   Cardiac Enzymes:  Basename 01/12/12 0100 01/11/12 1701  CKTOTAL 224* 208*  CKMB 5.2* 6.7*  CKMBINDEX -- --  TROPONINI 0.98* 0.59*   CBC:  Lab 01/13/12 0430 01/12/12 0350 01/11/12 0430 01/10/12 1525 01/10/12 1431 01/10/12 0430 01/09/12 1517  WBC 8.3 9.5 13.8* -- 12.4* 13.4* 13.5*  NEUTROABS -- -- 11.3* -- -- -- --  HGB 10.4* 7.6* 8.3* 10.5* 8.4* 8.7* 9.6*  HCT 32.1* 23.8* 26.2* 31.0* 26.4* 27.6* 30.1*  MCV 90.2 92.2 93.6 -- 91.7 91.1 92.3  PLT 305 251 268 -- 299 288 325   Fasting Lipid Panel:  Basename 01/12/12 0350  CHOL --  HDL --  LDLCALC --  TRIG 133  CHOLHDL --  LDLDIRECT --     ASSESSMENT AND PLAN:  Patient Active Hospital Problem List:   ESRD (end stage renal disease) (A999333)   Acute systolic CHF  (congestive heart failure) (01/09/2012)     Acute MI (01/09/2012)  Hives  Stable post extrubation for acute pulm edema, prob ischemic Improving, need to begin to mobilize PT consulted yesterday On ticagrelor       Signed, Virl Axe MD  01/14/2012

## 2012-01-14 NOTE — Progress Notes (Signed)
CARDIAC REHAB PHASE I   PRE:  Rate/Rhythm: 90 SR  BP:  Supine: 158/71  Sitting: 170/65 recheck 179/75  Standing:    SaO2: 93-96 RA  MODE:  Ambulation: 284 ft   POST:  Rate/Rhythem: 108  BP:  Supine:   Sitting: 172/65  Standing:    SaO2: 97 RA 1355-1435 Assisted X 2 used gait belt and walker to ambulate. When pt first stood she was very unsteady. She stood and sat several times due to being unbalanced and need to change her gown. After the several attempts  to stand pt able to gain her balance and walk 284 feet without any rest stops. She did have some difficulty steering walker and required some assistance. No c/o with walking of any cp or SOB. Pt tired by end of walk, to recliner after walk with call light in reach.  Deon Pilling

## 2012-01-15 ENCOUNTER — Inpatient Hospital Stay (HOSPITAL_COMMUNITY): Payer: Medicare Other

## 2012-01-15 LAB — BASIC METABOLIC PANEL
Calcium: 9.6 mg/dL (ref 8.4–10.5)
GFR calc Af Amer: 7 mL/min — ABNORMAL LOW (ref >90)
GFR calc non Af Amer: 6 mL/min — ABNORMAL LOW (ref >90)
Glucose, Bld: 103 mg/dL — ABNORMAL HIGH (ref 70–99)
Potassium: 3.9 mEq/L (ref 3.5–5.1)
Sodium: 138 mEq/L (ref 135–145)

## 2012-01-15 LAB — EXPECTORATED SPUTUM ASSESSMENT W GRAM STAIN, RFLX TO RESP C

## 2012-01-15 LAB — CBC
HCT: 30.5 % — ABNORMAL LOW (ref 36.0–46.0)
Hemoglobin: 9.8 g/dL — ABNORMAL LOW (ref 12.0–15.0)
MCHC: 32.1 g/dL (ref 30.0–36.0)
RBC: 3.37 MIL/uL — ABNORMAL LOW (ref 3.87–5.11)

## 2012-01-15 IMAGING — CR DG CHEST 1V PORT
1 series · 1 of 1 positions shown · non-contrast
Comparison: Portable chest x-ray [DATE] dating back to
[DATE] [HOSPITAL] and [DATE] [place] [HOSPITAL].

CLINICAL DATA: Productive cough.  End-stage renal disease on
hemodialysis.

PORTABLE CHEST - 1 VIEW [DATE]/[PHONE_NUMBER] hours:

[AP]
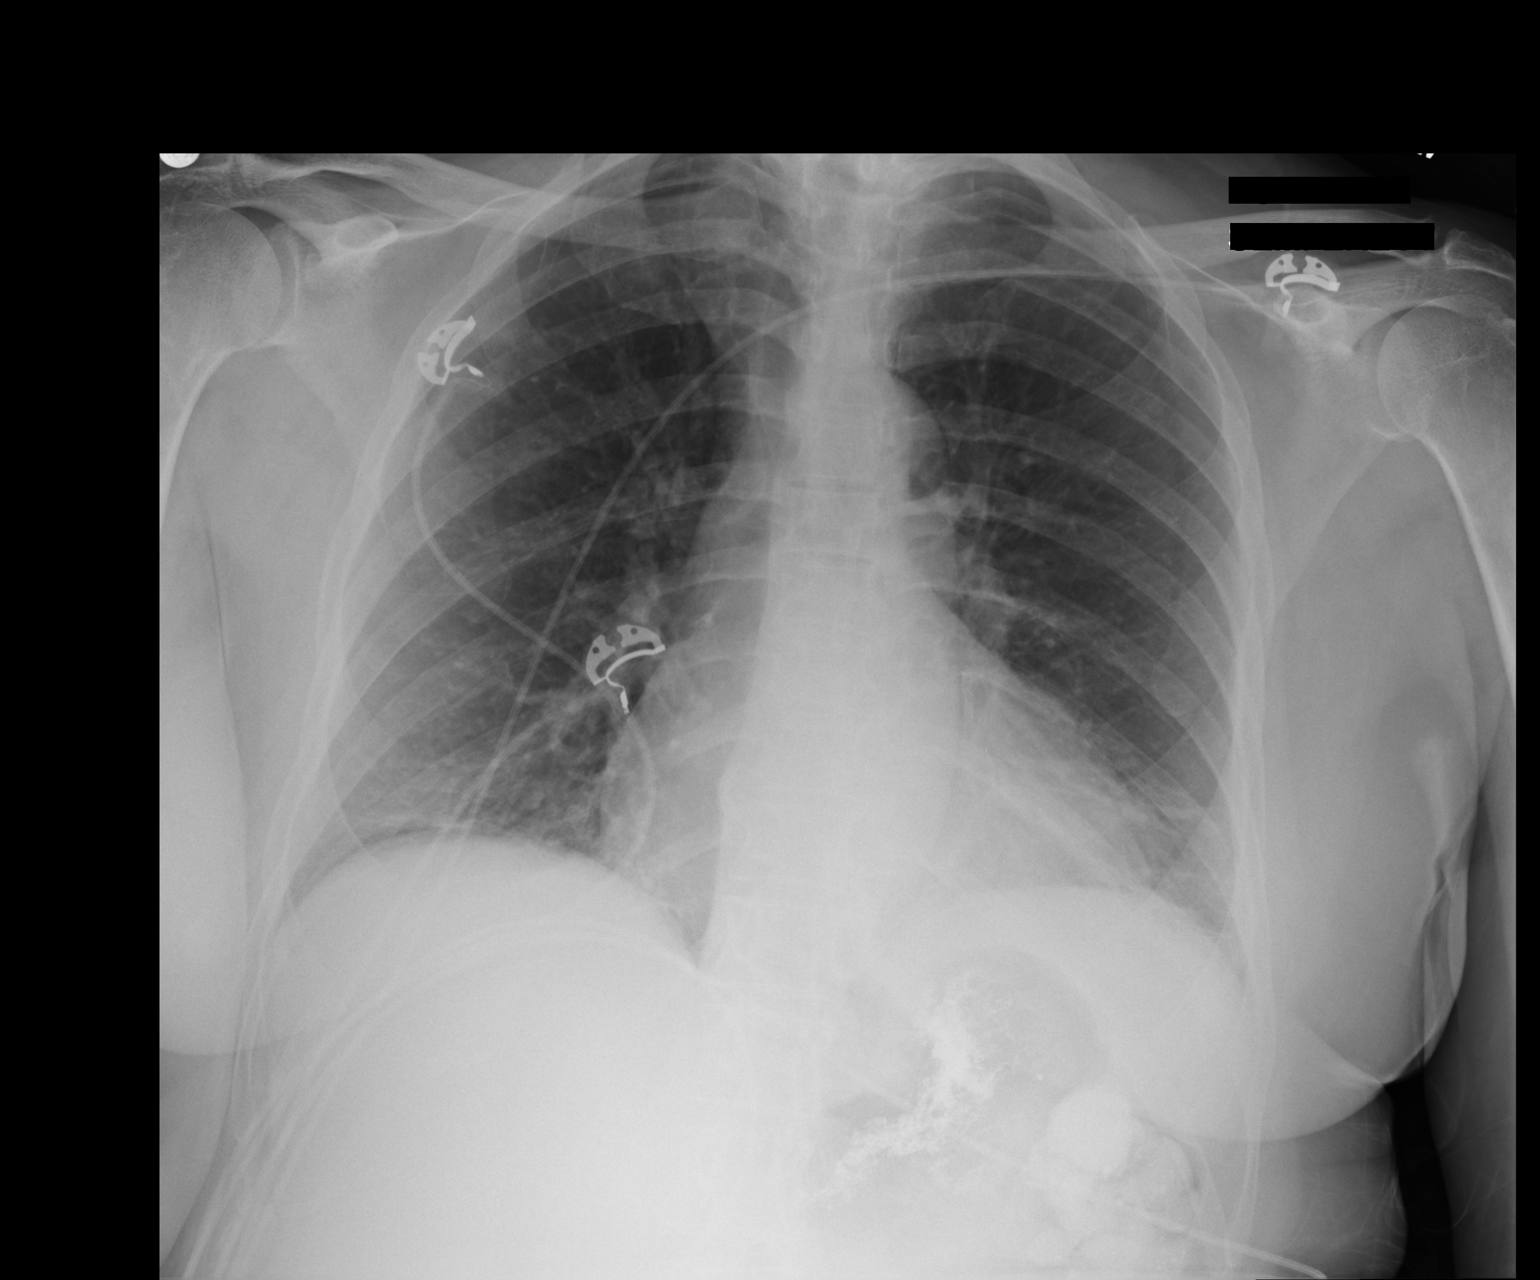

[1 of 1 positions shown; findings below may reference images not displayed]

FINDINGS: Interval extubation and nasogastric tube removal.
Cardiac silhouette enlarged but stable.  Pulmonary vascularity
normal without evidence pulmonary edema.  Mild atelectasis in the
lung bases with improved aeration since the prior examination.  No
new pulmonary parenchymal abnormalities.
IMPRESSION: Mild bibasilar atelectasis post-extubation, though aeration has
improved since examination 3 days ago.  No new abnormalities.
Stable cardiomegaly without pulmonary edema.

## 2012-01-15 IMAGING — CR DG CHEST 1V PORT
1 series · 1 of 1 positions shown · non-contrast
Comparison: [YJ] hours

CLINICAL DATA: Short of breath

PORTABLE CHEST - 1 VIEW

[AP]
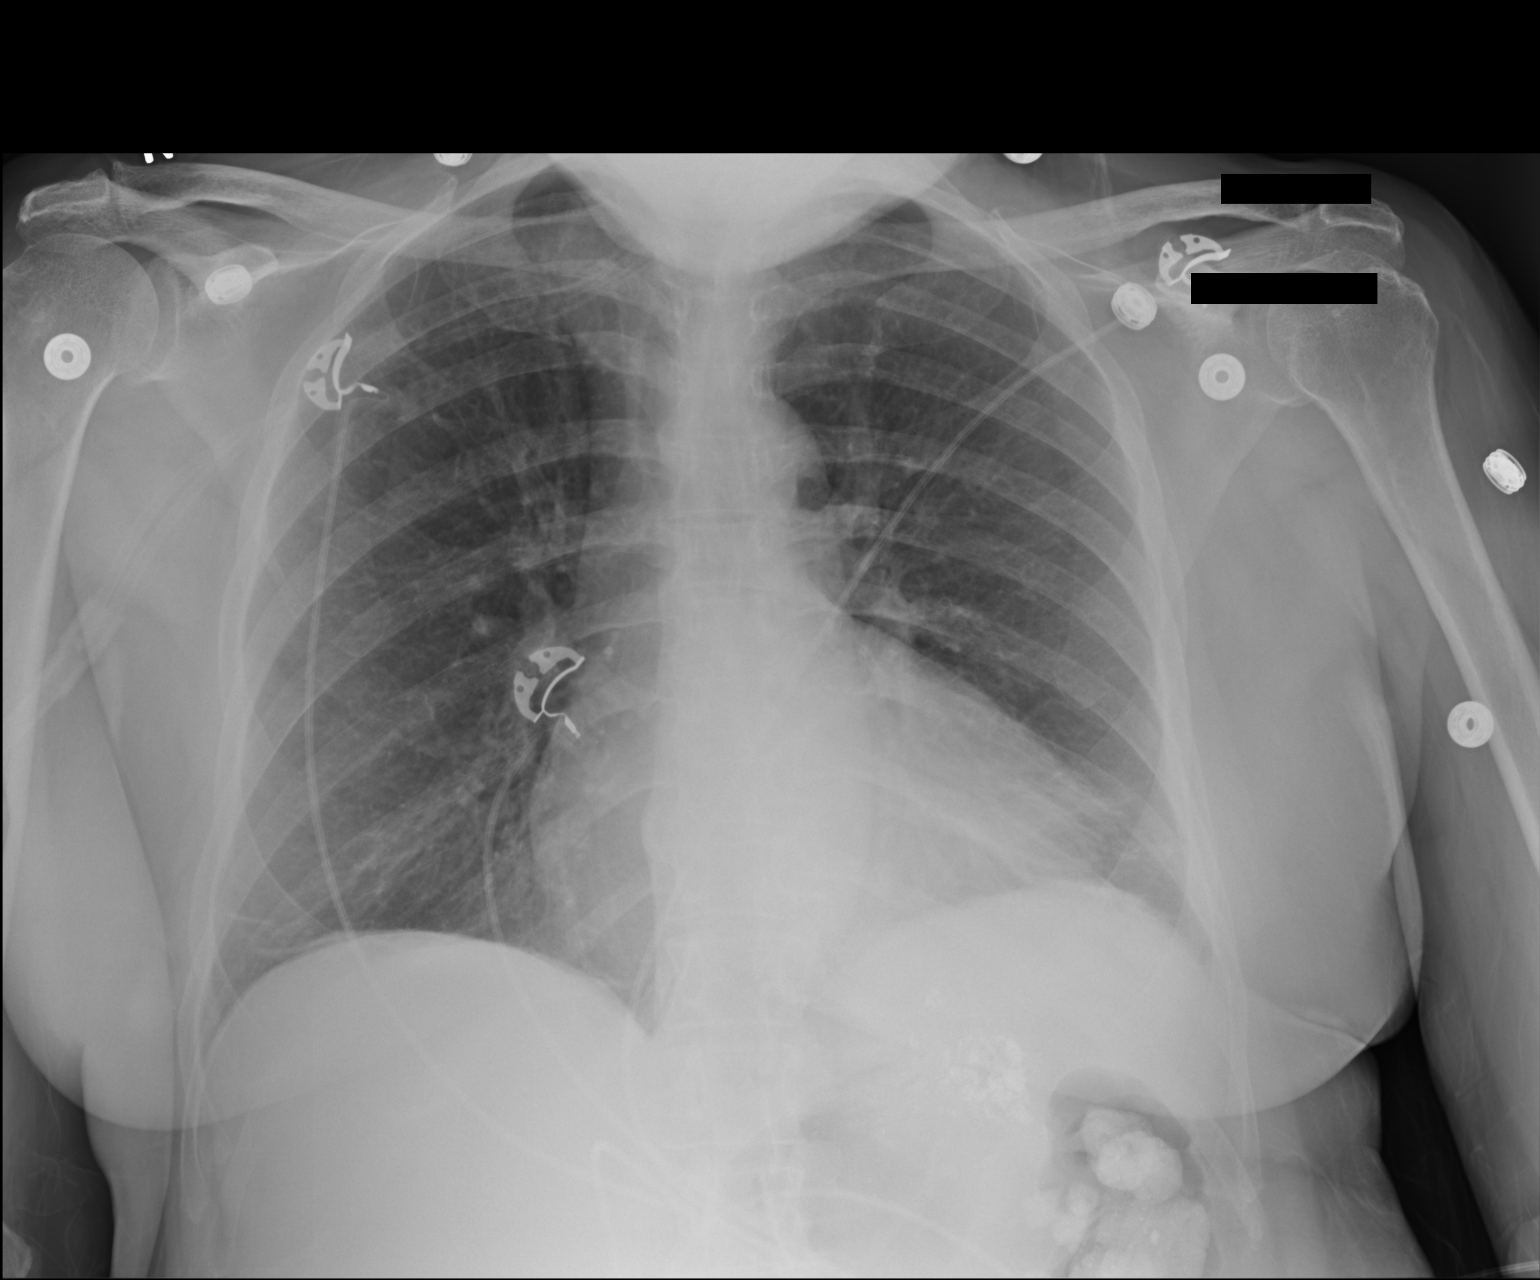

[1 of 1 positions shown; findings below may reference images not displayed]

FINDINGS: Mild cardiomegaly.  Improved bibasilar atelectasis.
Upper lungs are clear.  No pneumothorax.
IMPRESSION: Improved bibasilar atelectasis.

## 2012-01-15 MED ORDER — GUAIFENESIN 100 MG/5ML PO SOLN
200.0000 mg | ORAL | Status: DC | PRN
Start: 2012-01-15 — End: 2012-01-25
  Administered 2012-01-15 – 2012-01-25 (×7): 200 mg via ORAL
  Filled 2012-01-15 (×6): qty 10
  Filled 2012-01-15: qty 118
  Filled 2012-01-15 (×4): qty 10

## 2012-01-15 MED ORDER — DIPHENHYDRAMINE HCL 25 MG PO CAPS: 25.0000 mg | ORAL_CAPSULE | Freq: Four times a day (QID) | ORAL | Status: DC | PRN

## 2012-01-15 MED ORDER — ALBUTEROL SULFATE (5 MG/ML) 0.5% IN NEBU
2.5000 mg | INHALATION_SOLUTION | RESPIRATORY_TRACT | Status: DC | PRN
  Administered 2012-01-15: 2.5 mg via RESPIRATORY_TRACT

## 2012-01-15 MED ORDER — PANTOPRAZOLE SODIUM 20 MG PO TBEC
20.0000 mg | DELAYED_RELEASE_TABLET | Freq: Every day | ORAL | Status: DC
  Administered 2012-01-15 – 2012-01-17 (×3): 20 mg via ORAL
  Filled 2012-01-15 (×3): qty 1

## 2012-01-15 MED ORDER — HEPARIN SODIUM (PORCINE) 1000 UNIT/ML DIALYSIS: 20.0000 [IU]/kg | INTRAMUSCULAR | Status: DC | PRN

## 2012-01-15 MED ORDER — ALBUTEROL SULFATE (5 MG/ML) 0.5% IN NEBU
INHALATION_SOLUTION | RESPIRATORY_TRACT | Status: AC
  Filled 2012-01-15: qty 0.5

## 2012-01-15 MED ORDER — LORATADINE 10 MG PO TABS
10.0000 mg | ORAL_TABLET | Freq: Every day | ORAL | Status: DC
  Administered 2012-01-15 – 2012-01-25 (×11): 10 mg via ORAL
  Filled 2012-01-15 (×11): qty 1

## 2012-01-15 MED ORDER — FLUTICASONE PROPIONATE 50 MCG/ACT NA SUSP
2.0000 | Freq: Every day | NASAL | Status: DC
  Administered 2012-01-15 – 2012-01-25 (×8): 2 via NASAL
  Filled 2012-01-15: qty 16

## 2012-01-15 NOTE — Progress Notes (Signed)
Pt. Complaining of congestion this morning.  She states that she takes either flonase or afrin at home and mucinex.  Pt. Also complaining of hives this morning.  Benadryl 25mg  PO given at 0450 with no relief.  Doctor on call paged.  See new orders.  Will continue to monitor patient.

## 2012-01-15 NOTE — Progress Notes (Deleted)
Called by primary RN to see pt for c/o increased WOB.  On arrival pt sitting on the side of the bed leaning over the BS table.  Pt with clear but very diminished BS & states she has been coughing up blood since this afternoon.  RR 22 Sats 99% on 2L & pt able to speak in sentences though in a whisper.  Pt states she has chest tightness but denies pain.  BP 182/90 HR 98.  RN to give prn labetalol for BP & attempting to contact MD.

## 2012-01-15 NOTE — Progress Notes (Signed)
Patient Name: Shawna [name]      SUBJECTIVE: 71 year old female who was, a few weeks ago, admitted to [facility] after she ruled in for non-ST elevation myocardial infarction. She initially underwent a dobutamine stress echocardiogram, which yielded lateral ischemia. She was then referred for cardiac catheterization and was found to have triple vessel CAD with severe circumflex disease and diagonal branch disease and moderate LAD disease. She underwent successful DES of the circumflex and diagonal artery with no noted complications. Ejection fraction was 55%. No significant mitral regurgitation. The patient was placed on aspirin and Plavix; however, she developed a rash secondary to Plavix. She called in to the office and was given a prescription for Brilinta. Unfortunately, she never picked up her Brilinta.  She was readmitted to [facility] on July 1 with Acute Pulm edema>:>>intubated  and Positive troponin, felt  Though not to be stent thrombosis. Tricagelor strarted     Cath >> found to have ostial RCA>>DES stent; normal LV function  Finally extubated; still co sore throat and cough productive of sputum  Continues to have hives on lower extremities,  she says this antedates her hospitalization and antiplatlet drugs, responds to benadryl  Happened again this am  No chest pain or shortness of breath But c/o palpitations  Past Medical History  Diagnosis Date  . ESRD (end stage renal disease) on dialysis 12/15/11    [place]; Mon; Wed; Fri  . Hypertension   . CHF (congestive heart failure)   . Anginal pain   . Chronic bronchitis 12/15/11    "have had it last 4 wk"  . Pneumonia ~ 2010  . Anemia   . History of blood transfusion 07/2011    "first time"  . GERD (gastroesophageal reflux disease)   . History of stomach ulcers   . History of lower GI bleeding   . Arthritis     "hands and feet"  . History of gout   . Anxiety   . Ovarian cancer 1992  . Colon cancer   . CAD (coronary artery  disease)     a. 12/2011 s/p DES to LCX and Diag    PHYSICAL EXAM Filed Vitals:   01/14/12 1100 01/14/12 1120 01/14/12 2243 01/15/12 0414  BP: 153/64 173/59 151/56 148/67  Pulse: 93 75 82 88  Temp:  98.5 F (36.9 C) 99.2 F (37.3 C) 97.8 F (36.6 C)  TempSrc:  Oral Oral Oral  Resp: 19 23 20 18   Height:      Weight:  138 lb 10.7 oz (62.9 kg)  139 lb 15.9 oz (63.5 kg)  SpO2:  100% 100% 96%    Well developed and nourished in no acute distress on dialysis HENT normal Neck supple with JVP-flat Clear Regular rate and rhythm, no murmurs or gallops Abd-soft with active BS No Clubbing cyanosis edema Skin-warm and dry; no rash  A & Oriented  Grossly normal sensory and motor function   TELEMETRY: Reviewed telemetry pt in sinus with pacing   Intake/Output Summary (Last 24 hours) at 01/15/12 0911 Last data filed at 01/15/12 0455  Gross per 24 hour  Intake    453 ml  Output   1196 ml  Net   -743 ml    LABS: Basic Metabolic Panel:  Lab [id number] 0500 01/14/12 0730 01/13/12 0430 01/12/12 1320 01/12/12 0350 01/11/12 0430 01/10/12 1525 01/10/12 0430 01/09/12 1517  NA 138 136 142 139 135 134* 138 -- --  K 3.9 4.4 4.5 3.9 6.5* 5.0 3.9 -- --  CL 94* 91* 94* 93* 92* 91* 100 -- --  CO2 26 23 28 24 23 22  -- 24 --  GLUCOSE 103* 108* 125* 119* 132* 125* 168* -- --  BUN 55* 86* 42* 21 74* 45* 24* -- --  CREATININE 6.39* 8.97* 5.75* 3.42* 9.26* 6.55* 5.10* -- --  CALCIUM 9.6 9.0 -- -- -- -- -- -- --  MG -- -- -- -- -- -- -- -- 1.4*  PHOS -- -- -- -- -- -- -- -- 3.7   Cardiac Enzymes: No results found for this basename: CKTOTAL:3,CKMB:3,CKMBINDEX:3,TROPONINI:3 in the last 72 hours CBC:  Lab 01/13/12 0430 01/12/12 0350 01/11/12 0430 01/10/12 1525 01/10/12 1431 01/10/12 0430 01/09/12 1517  WBC 8.3 9.5 13.8* -- 12.4* 13.4* 13.5*  NEUTROABS -- -- 11.3* -- -- -- --  HGB 10.4* 7.6* 8.3* 10.5* 8.4* 8.7* 9.6*  HCT 32.1* 23.8* 26.2* 31.0* 26.4* 27.6* 30.1*  MCV 90.2 92.2 93.6 -- 91.7 91.1  92.3  PLT 305 251 268 -- 299 288 325   Fasting Lipid Panel: No results found for this basename: CHOL,HDL,LDLCALC,TRIG,CHOLHDL,LDLDIRECT in the last 72 hours   ASSESSMENT AND PLAN:  Patient Active Hospital Problem List:   ESRD (end stage renal disease) (A999333)   Acute systolic CHF (congestive heart failure) (01/09/2012)     Acute MI (01/09/2012)  Hives  Cough  Palpitations    Stable post extrubation for acute pulm edema, prob ischemic Improving, need to begin to mobilize PT consulted yesterday On ticagrelor Using incentive spirometry..will add cipro for sputum/cough Tel>> no arrhtymia to explain palps  Disposition  Home 24 hrs  May need outpt derm eval for urticaria  Begin loratidine      Signed, Virl Axe MD  01/15/2012

## 2012-01-15 NOTE — Progress Notes (Signed)
Pt. Called nurse into room saying she was having trouble breathing.  Upon entering room, patient was sitting on the side of the bed with labored breathing.  BP 182/90 manually, HR NSR in the low 100s, O2 sat 97% on room air.  Pt. Was put on 2L o2 via [place], and O2 sats went up to 99-100%.  Lung sounds clear but diminished.   Pt. Also complaining about coughing up a lot of bright blood.  Pt. Was given prn labetalol as ordered.  Rapid response called who came and saw patient.  Dr. Radford Pax was paged and came to see patient.  Pt. Given 1 nitro sublingual for chest tightness and EKG obtained per MD order.  BP came down to 137/74 and HR in the 80s.  MD ordered patient to be transferred to step down where report was given to RN.   Pt. Transferred to 2925 with all belongings.  Husband currently at bedside.

## 2012-01-15 NOTE — Progress Notes (Signed)
Assessment/Plan:  1 ESRD  2 s/p Respiratory Failure  3 CHF, resolved  4 ASCVD s/p PCI  5. Anemia s/p PRBCs   Plan. Weaning. HD MWF, off schedule next treatment Mon.   Subjective: Interval History:c/o episodic rash and itching  Objective: Vital signs in last 24 hours: Temp:  [97.8 F (36.6 C)-99.2 F (37.3 C)] 97.8 F (36.6 C) (07/07 0414) Pulse Rate:  [75-88] 88  (07/07 0414) Resp:  [18-23] 18  (07/07 0414) BP: (148-173)/(56-67) 148/67 mmHg (07/07 0414) SpO2:  [96 %-100 %] 96 % (07/07 0414) Weight:  [62.9 kg (138 lb 10.7 oz)-63.5 kg (139 lb 15.9 oz)] 63.5 kg (139 lb 15.9 oz) (07/07 0414) Weight change: 1.1 kg (2 lb 6.8 oz)  Intake/Output from previous day: 07/06 0701 - 07/07 0700 In: 453 [P.O.:450; I.V.:3] Out: 1196  Intake/Output this shift: Total I/O In: 240 [P.O.:240] Out: -   General appearance: alert, cooperative and appears stated age Resp: clear to auscultation bilaterally Chest wall: no tenderness GI: soft, non-tender; bowel sounds normal; no masses,  no organomegaly Extremities: extremities normal, atraumatic, no cyanosis or edema, AV access lue functioning Cor RRR  Lab Results:  Basename 01/13/12 0430  WBC 8.3  HGB 10.4*  HCT 32.1*  PLT 305   BMET:  Basename 01/15/12 0500 01/14/12 0730  NA 138 136  K 3.9 4.4  CL 94* 91*  CO2 26 23  GLUCOSE 103* 108*  BUN 55* 86*  CREATININE 6.39* 8.97*  CALCIUM 9.6 9.0   No results found for this basename: PTH:2 in the last 72 hours Iron Studies: No results found for this basename: IRON,TIBC,TRANSFERRIN,FERRITIN in the last 72 hours Studies/Results: No results found.  Scheduled:   . allopurinol  300 mg Oral Daily  . amLODipine  10 mg Oral Daily  . aspirin  81 mg Oral Daily  . atorvastatin  20 mg Oral q1800  . cloNIDine  0.1 mg Oral BID  . fluticasone  2 spray Each Nare Daily  . heparin subcutaneous  5,000 Units Subcutaneous Q8H  . iron polysaccharides  150 mg Oral BID WC  . lanthanum  1,000 mg Oral  TID WC  . loratadine  10 mg Oral Daily  . metoprolol tartrate  12.5 mg Oral BID  . multivitamin  1 tablet Oral QHS  . pantoprazole sodium  20 mg Per Tube Q1200  . sodium chloride  3 mL Intravenous Q12H  . Ticagrelor  90 mg Oral BID     LOS: 6 days   Essynce Munsch C 01/15/2012,11:15 AM

## 2012-01-15 NOTE — Treatment Plan (Signed)
On-Call Cardiology Note:  I was called to evaluate the patient for shortness of breath and hemoptysis.  Her sputum contains bright red blood clots.  She states that she has had enough hemoptysis to fill up a "dixie cup."  She notes mild chest tightness.  Vitals are stable (BP 130/80, HR 80, O2 99% on RA).  She denies any prior lung history or weight loss. STAT CXR demonstrates mild bibasilar atelectasis but no evidence of pulmonary hemorrhage.  A/P: Hemoptysis with bright red blood clots, s/p PCI RCA - currently on treatment with dual antiplatelet therapy - transfer to 2900 for closer observation - check CBC to assess HGB - check EKG given chest tightness - pulmonary consulted (Ramaswamy) for possible bronchoscopy to rule out endobronchial lesions - she needs to remain on dual antiplatelet therapy (ASA and brilinta) because of her recent PCI  Delfin Gant, MD Cardiology

## 2012-01-15 NOTE — Progress Notes (Signed)
Called by primary RN to see pt for c/o increased WOB & coughing up blood.  On arrival pt is sitting on side on bed leaning over the BS table, Sats 99% on 2L RR 22, BP 182/90, BS clear but very diminished.  Pt able to speak in complete sentences but in a whisper.  She states she has chest tightness but denies pain.  RN to give prn labetaolo & contacting MD.

## 2012-01-16 ENCOUNTER — Inpatient Hospital Stay (HOSPITAL_COMMUNITY): Payer: Medicare Other

## 2012-01-16 LAB — EXPECTORATED SPUTUM ASSESSMENT W GRAM STAIN, RFLX TO RESP C

## 2012-01-16 LAB — BASIC METABOLIC PANEL
BUN: 83 mg/dL — ABNORMAL HIGH (ref 6–23)
Chloride: 90 mEq/L — ABNORMAL LOW (ref 96–112)
GFR calc Af Amer: 5 mL/min — ABNORMAL LOW (ref >90)
Potassium: 4.6 mEq/L (ref 3.5–5.1)

## 2012-01-16 LAB — TYPE AND SCREEN
ABO/RH(D): O POS
Unit division: 0

## 2012-01-16 LAB — CBC
HCT: 31.9 % — ABNORMAL LOW (ref 36.0–46.0)
Hemoglobin: 10.2 g/dL — ABNORMAL LOW (ref 12.0–15.0)
MCH: 28.9 pg (ref 26.0–34.0)
MCV: 90.4 fL (ref 78.0–100.0)
Platelets: 335 10*3/uL (ref 150–400)
RBC: 3.53 MIL/uL — ABNORMAL LOW (ref 3.87–5.11)

## 2012-01-16 IMAGING — CT CT CHEST W/O CM
2 of 4 series · 15 of 36 positions shown, 18 images · non-contrast
Comparison: Chest radiograph [DATE].

CLINICAL DATA: Persistent chest congestion and difficulty
breathing.  Hemoptysis.

CT CHEST WITHOUT CONTRAST
TECHNIQUE: Multidetector CT imaging of the chest was performed
following the standard protocol without IV contrast.

[Series 2: routine chest 5.0 st · axial · 0.59mm/px · z∈[-280,-60]mm · 12 of 52 slices shown, 15 images]
[im 4/52  mediastinal]
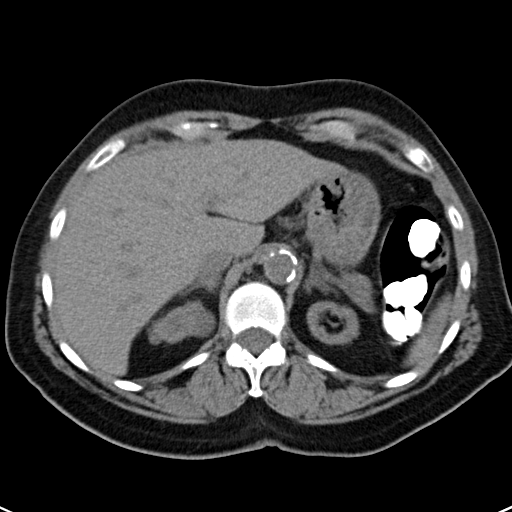
[im 4/52  lung]
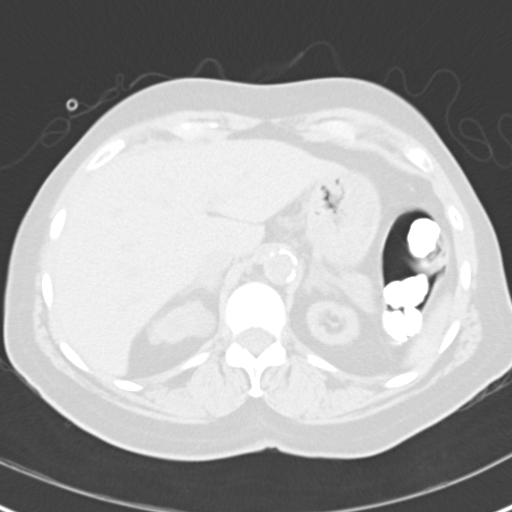
[im 8/52  lung]
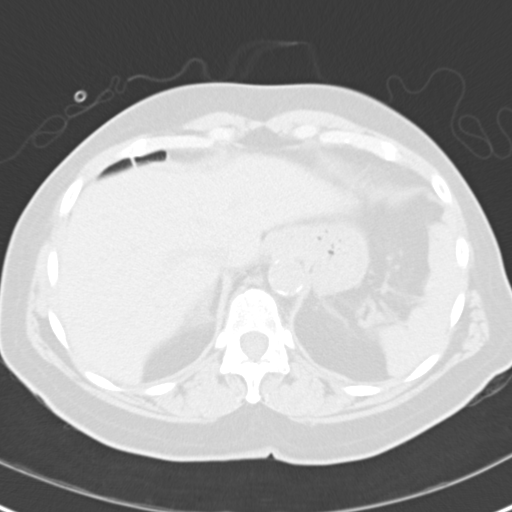
[im 12/52  lung]
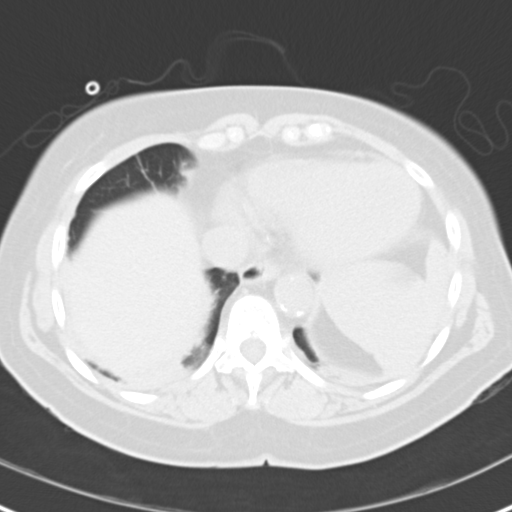
[im 16/52  lung]
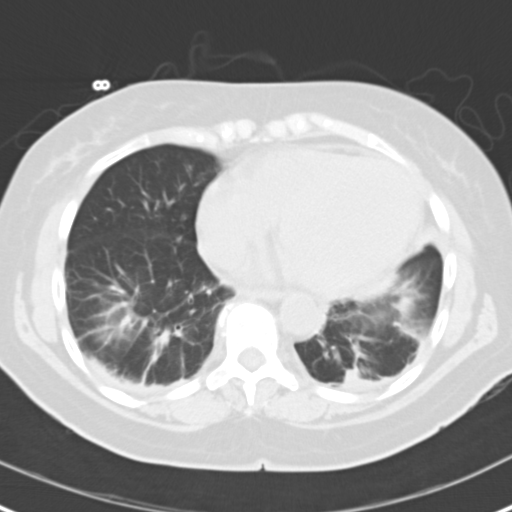
[im 20/52  mediastinal]
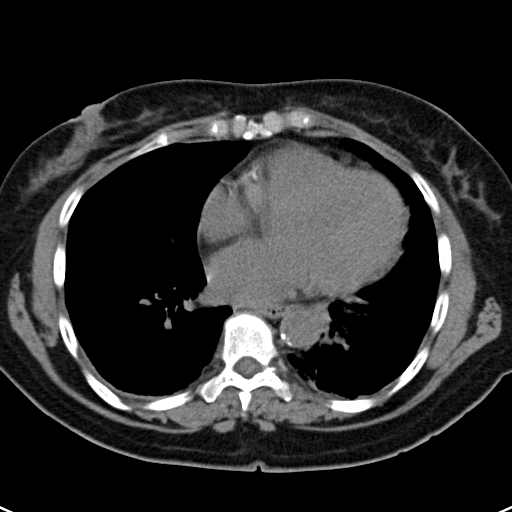
[im 20/52  lung]
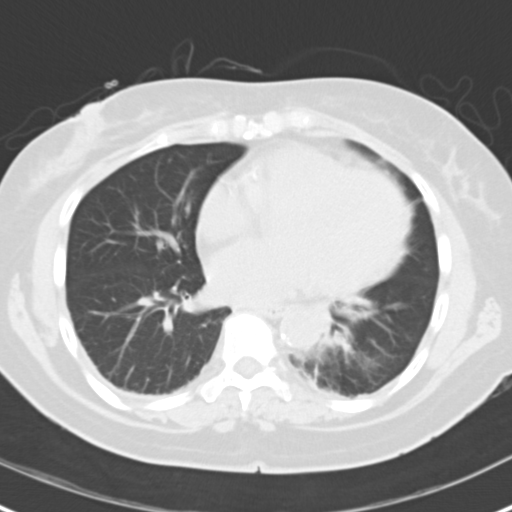
[im 24/52  lung]
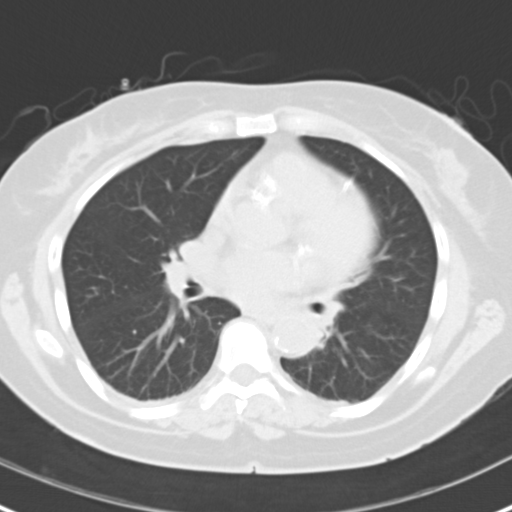
[im 28/52  lung]
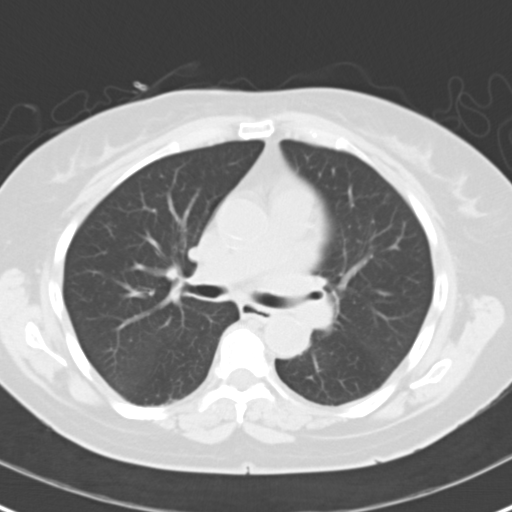
[im 32/52  lung]
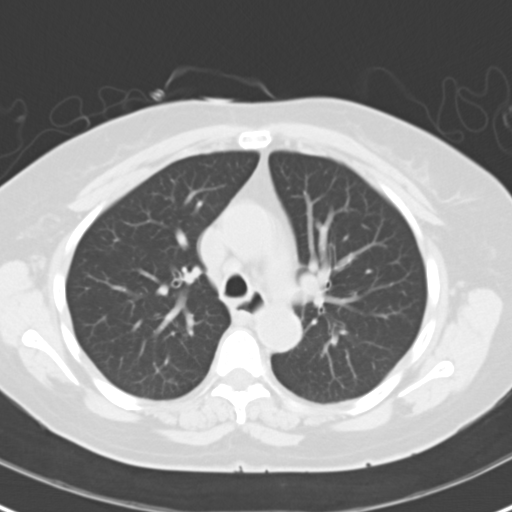
[im 36/52  mediastinal]
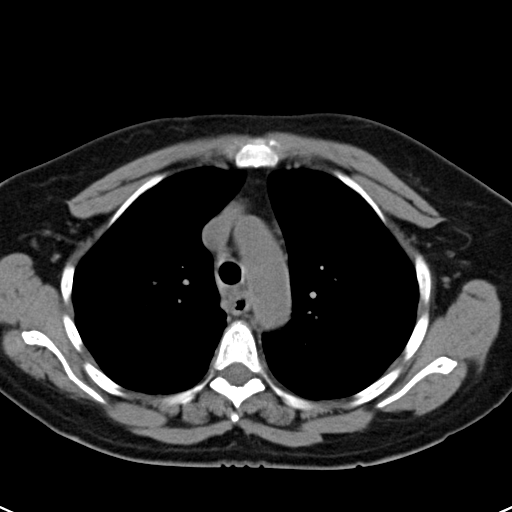
[im 36/52  lung]
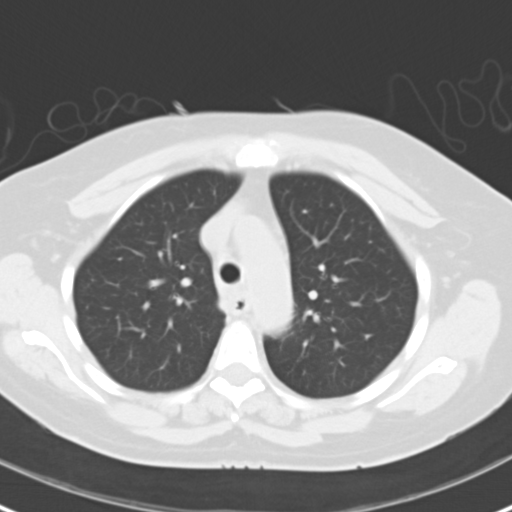
[im 40/52  lung]
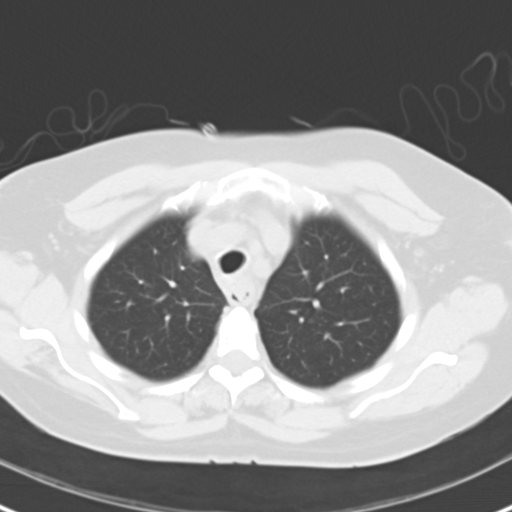
[im 44/52  lung]
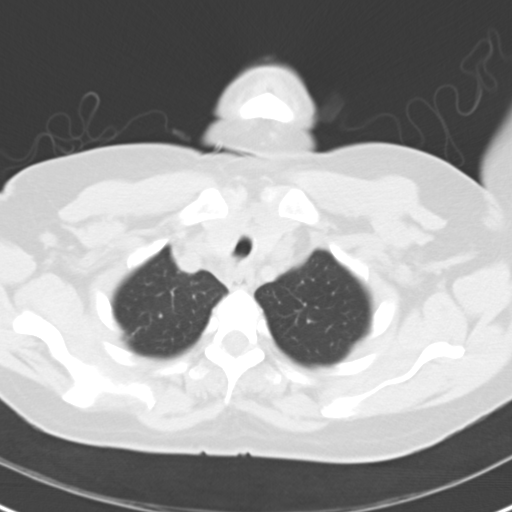
[im 48/52  lung]
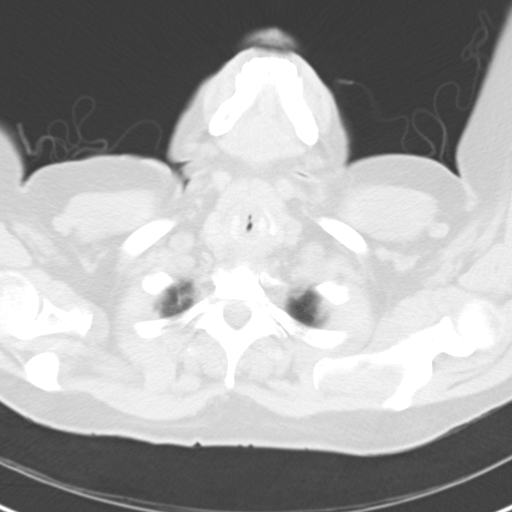

[Series 5: coronals · coronal · 0.57mm/px · 3 of 93 slices shown]
[im 19/93  lung]
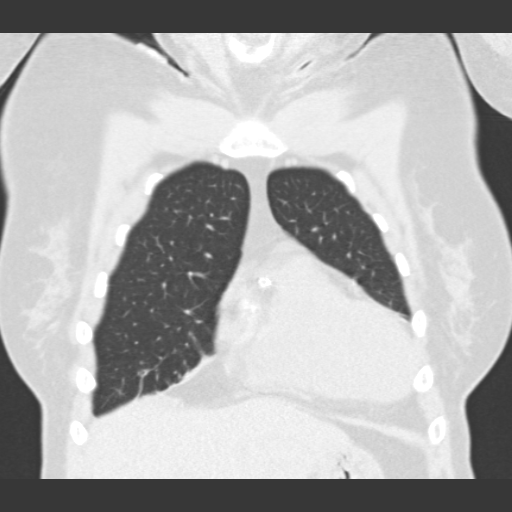
[im 37/93  lung]
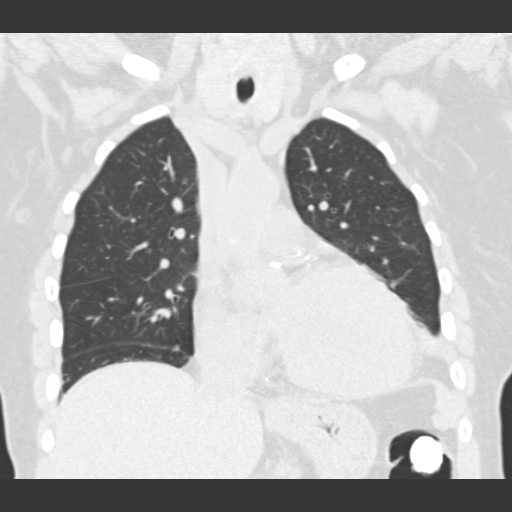
[im 56/93  lung]
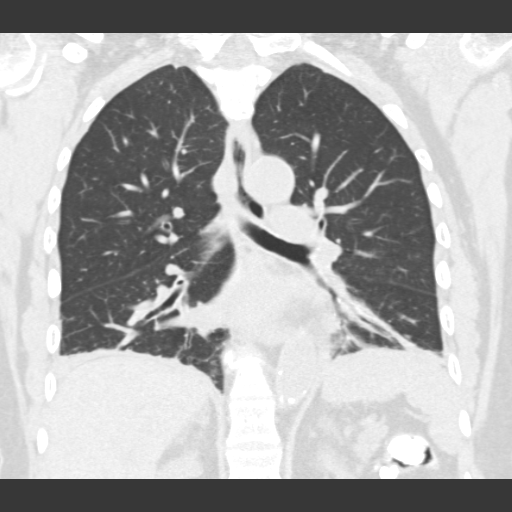

[15 of 36 positions shown; findings below may reference images not displayed]

FINDINGS: No pathologically enlarged mediastinal or axillary lymph
nodes.  Hilar regions are difficult to definitively evaluate
without IV contrast.  Extensive coronary artery calcification.
Heart is mildly enlarged.  No pericardial effusion. Difficult to
exclude eccentric thickening of the upper esophagus (image 13).
Portions of the esophagus are air filled and dilated.

Mild air space disease is seen dependently in both lower lobes.  No
pleural fluid.

Incidental imaging of the upper abdomen shows low attenuation
lesions in the upper pole right kidney, as on [DATE].  Kidneys
are atrophic.  Retained oral contrast in the splenic flexure of the
colon.  No worrisome lytic or sclerotic lesions.
IMPRESSION: 1.  Minimal airspace disease in both lower lobes, possibly due to
resolving pneumonia and/or atelectasis.
2.  Difficult to exclude eccentric thickening of the upper
esophagus.  If clinically warranted, an esophagram could be
performed in further evaluation.
3.  Extensive coronary artery calcification.

## 2012-01-16 MED ORDER — DIPHENHYDRAMINE HCL 25 MG PO CAPS
25.0000 mg | ORAL_CAPSULE | Freq: Three times a day (TID) | ORAL | Status: DC | PRN
  Administered 2012-01-16 – 2012-01-18 (×3): 25 mg via ORAL
  Filled 2012-01-16 (×3): qty 1

## 2012-01-16 MED ORDER — DARBEPOETIN ALFA-POLYSORBATE 60 MCG/0.3ML IJ SOLN
60.0000 ug | INTRAMUSCULAR | Status: DC
  Administered 2012-01-16: 60 ug via INTRAVENOUS
  Filled 2012-01-16: qty 0.3

## 2012-01-16 MED ORDER — AMLODIPINE BESYLATE 5 MG PO TABS
5.0000 mg | ORAL_TABLET | Freq: Every day | ORAL | Status: DC
  Administered 2012-01-17: 5 mg via ORAL
  Filled 2012-01-16 (×2): qty 1

## 2012-01-16 MED ORDER — ALPRAZOLAM 0.25 MG PO TABS
0.2500 mg | ORAL_TABLET | Freq: Once | ORAL | Status: AC
  Administered 2012-01-16: 0.25 mg via ORAL

## 2012-01-16 MED ORDER — CLONIDINE HCL 0.1 MG PO TABS
0.1000 mg | ORAL_TABLET | Freq: Three times a day (TID) | ORAL | Status: DC
  Administered 2012-01-16: 0.1 mg via ORAL
  Filled 2012-01-16 (×3): qty 1

## 2012-01-16 MED ORDER — DARBEPOETIN ALFA-POLYSORBATE 60 MCG/0.3ML IJ SOLN
INTRAMUSCULAR | Status: AC
  Administered 2012-01-16: 60 ug via INTRAVENOUS
  Filled 2012-01-16: qty 0.3

## 2012-01-16 MED ORDER — AMLODIPINE BESYLATE 10 MG PO TABS: 10.0000 mg | ORAL_TABLET | Freq: Every day | ORAL | Status: DC

## 2012-01-16 NOTE — Progress Notes (Signed)
Subjective:  Complains of mild chest pressure. EKGs show no acute changes. Still coughing up blood which is by description dark and mixed with mucus. CT of chest done earlier this am, results pending. CCM has suggested we also have ENT see her.  Will contact ENT.  Objective:  Vital Signs in the last 24 hours: Temp:  [97.6 F (36.4 C)-99.8 F (37.7 C)] 98.6 F (37 C) (07/08 0730) Pulse Rate:  [74-106] 98  (07/08 0730) Resp:  [13-23] 23  (07/08 0730) BP: (137-198)/(55-104) 168/73 mmHg (07/08 0730) SpO2:  [97 %-100 %] 100 % (07/08 0730) Weight:  [141 lb 12.1 oz (64.3 kg)] 141 lb 12.1 oz (64.3 kg) (07/08 0432)  Intake/Output from previous day: 07/07 0701 - 07/08 0700 In: 723 [P.O.:720; I.V.:3] Out: -  Intake/Output from this shift:       . albuterol      . allopurinol  300 mg Oral Daily  . ALPRAZolam  0.25 mg Oral Once  . amLODipine  10 mg Oral Daily  . aspirin  81 mg Oral Daily  . atorvastatin  20 mg Oral q1800  . cloNIDine  0.1 mg Oral BID  . fluticasone  2 spray Each Nare Daily  . iron polysaccharides  150 mg Oral BID WC  . lanthanum  1,000 mg Oral TID WC  . loratadine  10 mg Oral Daily  . metoprolol tartrate  12.5 mg Oral BID  . multivitamin  1 tablet Oral QHS  . pantoprazole  20 mg Oral Q1200  . sodium chloride  3 mL Intravenous Q12H  . Ticagrelor  90 mg Oral BID  . DISCONTD: heparin subcutaneous  5,000 Units Subcutaneous Q8H  . DISCONTD: pantoprazole sodium  20 mg Per Tube Q1200      . sodium chloride 10 mL/hr at 01/11/12 2130  . dextrose 5 % and 0.45% NaCl Stopped (01/13/12 1000)    Physical Exam: The patient appears to be in no distress.  Head and neck exam reveals that the pupils are equal and reactive.  The extraocular movements are full.  There is no scleral icterus.  Mouth and pharynx are benign.  No lymphadenopathy.  No carotid bruits.  The jugular venous pressure is normal.  Thyroid is not enlarged or tender. No active bleeding seen from  mouth.  Chest is clear to percussion and auscultation.  No rales or rhonchi.  Expansion of the chest is symmetrical.  Heart reveals no abnormal lift or heave.  First and second heart sounds are normal.  There is no murmur gallop rub or click.  The abdomen is soft and nontender.  Bowel sounds are normoactive.  There is no hepatosplenomegaly or mass.  There are no abdominal bruits.  Extremities reveal no phlebitis or edema.  Pedal pulses are good.  There is no cyanosis or clubbing.  Neurologic exam is normal strength and no lateralizing weakness.  No sensory deficits.  Integument reveals no rash  Lab Results:  Basename 01/16/12 0440 01/15/12 2209  WBC 10.4 11.7*  HGB 10.2* 9.8*  PLT 335 362    Basename 01/16/12 0440 01/15/12 0500  NA 133* 138  K 4.6 3.9  CL 90* 94*  CO2 26 26  GLUCOSE 120* 103*  BUN 83* 55*  CREATININE 8.89* 6.39*   No results found for this basename: TROPONINI:2,CK,MB:2 in the last 72 hours Hepatic Function Panel No results found for this basename: PROT,ALBUMIN,AST,ALT,ALKPHOS,BILITOT,BILIDIR,IBILI in the last 72 hours No results found for this basename: CHOL in the last 72  hours No results found for this basename: PROTIME in the last 72 hours  Imaging: Ct Chest Wo Contrast  01/16/2012  *RADIOLOGY REPORT*  Clinical Data: Persistent chest congestion and difficulty breathing.  Hemoptysis.  CT CHEST WITHOUT CONTRAST  Technique:  Multidetector CT imaging of the chest was performed following the standard protocol without IV contrast.  Comparison: Chest radiograph 01/15/2012.  Findings: No pathologically enlarged mediastinal or axillary lymph nodes.  Hilar regions are difficult to definitively evaluate without IV contrast.  Extensive coronary artery calcification. Heart is mildly enlarged.  No pericardial effusion. Difficult to exclude eccentric thickening of the upper esophagus (image 13). Portions of the esophagus are air filled and dilated.  Mild air space disease  is seen dependently in both lower lobes.  No pleural fluid.  Incidental imaging of the upper abdomen shows low attenuation lesions in the upper pole right kidney, as on 11/02/2010.  Kidneys are atrophic.  Retained oral contrast in the splenic flexure of the colon.  No worrisome lytic or sclerotic lesions.  IMPRESSION:  1.  Minimal airspace disease in both lower lobes, possibly due to resolving pneumonia and/or atelectasis. 2.  Difficult to exclude eccentric thickening of the upper esophagus.  If clinically warranted, an esophagram could be performed in further evaluation. 3.  Extensive coronary artery calcification.  Original Report Authenticated By: Luretha Rued, M.D.   Dg Chest Port 1 View  01/15/2012  *RADIOLOGY REPORT*  Clinical Data: Short of breath  PORTABLE CHEST - 1 VIEW  Comparison: 1400 hours  Findings: Mild cardiomegaly.  Improved bibasilar atelectasis. Upper lungs are clear.  No pneumothorax.  IMPRESSION: Improved bibasilar atelectasis.  Original Report Authenticated By: Jamas Lav, M.D.   Dg Chest Port 1 View  01/15/2012  *RADIOLOGY REPORT*  Clinical Data: Productive cough.  End-stage renal disease on hemodialysis.  PORTABLE CHEST - 1 VIEW 01/15/2012 1415 hours:  Comparison: Portable chest x-ray 01/12/2012 dating back to 01/09/2012 [facility] and 09/19/2011 [facility].  Findings: Interval extubation and nasogastric tube removal. Cardiac silhouette enlarged but stable.  Pulmonary vascularity normal without evidence pulmonary edema.  Mild atelectasis in the lung bases with improved aeration since the prior examination.  No new pulmonary parenchymal abnormalities.  IMPRESSION: Mild bibasilar atelectasis post-extubation, though aeration has improved since examination 3 days ago.  No new abnormalities. Stable cardiomegaly without pulmonary edema.  Original Report Authenticated By: Deniece Portela, M.D.    Cardiac Studies: Telemetry NSR Assessment/Plan:  Patient  Active Hospital Problem List: CAD (coronary artery disease) (12/16/2011)   Assessment: On ASA and Brilinta for recent DES.   Plan: Continue same meds. HTN (hypertension) (12/16/2011)   Assessment: Systolic BP still high   Plan: Increase clonidine to TID  ESRD (end stage renal disease) (12/16/2011)   Assessment: Stable   Plan: Scheduled for HD today. Hemoptysis    Assessment: Source unknown    CT of chest done.  Will ask ENT to see.  LOS: 7 days    Darlin Coco 01/16/2012, 9:50 AM

## 2012-01-16 NOTE — Progress Notes (Signed)
PT Cancellation Note  Treatment cancelled this a.m. due to pt just finished walking with Cardiac Rehab. Will return later as her HD schedule allows.Marland Kitchen  Keajah Killough 01/16/2012, 11:15 AM

## 2012-01-16 NOTE — Progress Notes (Signed)
I met Mrs. Moyar's husband and daughter in the lobby as I was passing by.  When asked how he was doing he said he was fine but concerned about his wife and asked that I visit when I had a chance. I told him I would go now and he and his daughter went with me. He introduced me to his wife. She was very thankful to me for coming by. She said she had had a rough day the day before and took me through her journey of going from ICU to 4700 and now 2900. She and her family were very spiritual and when I offered prayer they gladly received it. They thanked me again for the visit and said they hoped to meet again.   Rosendo Gros Holder, [employer]

## 2012-01-16 NOTE — Procedures (Signed)
I was present at this session.  I have reviewed the session itself and made appropriate changes.  More vol and longer tx  Jarious Lyon L 7/8/20131:19 PM

## 2012-01-16 NOTE — Progress Notes (Signed)
PT Cancellation Note  Treatment cancelled today due to patient receiving procedure or test: hemodialysis.  Shawna [name] 01/16/2012, 3:06 PM Pager [phone]

## 2012-01-16 NOTE — Consult Note (Signed)
Name: Shawna [name] MRN: [id number] DOB: [birth year]    LOS: 7  PULMONARY / CRITICAL CARE MEDICINE CONSULT  HPI:   The patient is a 71 year old female ESRD on HD, hx of ovarian and colon cancer, recent DES, admitted concerns stent thrombosis vs NSTEMI.  Developed acute resp failure secondary to pulmonary edema requiring endotracheal intubation. She was successfully extubated on 01/12/12. Cardiac cath showed ostial RCA now post DES stent on dual antiplatelet therapy with aspirin and Brilinta. Today a rapid response was called for an episode of SOB and hemoptysis. Pulmonary consult was called for possible bronchoscopy to rule out endobronchial lesion. De patient states that she has coughed up about 1/2 a cup of bright red blood. Las episode about half an hour ago. The patient coughed while I was in the room and currently is phlegm mixed with blood. The patient states that she feels like the blood is coming from her larynx. Denies CP, no SOB, only mild discomfort because of secretions. At the time of my exam awake, alert, oriented x 3, hemodynamically stable, saturating 98% on RA.   Past Medical History  Diagnosis Date  . ESRD (end stage renal disease) on dialysis 12/15/11    [place]; Mon; Wed; Fri  . Hypertension   . CHF (congestive heart failure)   . Anginal pain   . Chronic bronchitis 12/15/11    "have had it last 4 wk"  . Pneumonia ~ 2010  . Anemia   . History of blood transfusion 07/2011    "first time"  . GERD (gastroesophageal reflux disease)   . History of stomach ulcers   . History of lower GI bleeding   . Arthritis     "hands and feet"  . History of gout   . Anxiety   . Ovarian cancer 1992  . Colon cancer   . CAD (coronary artery disease)     a. 12/2011 s/p DES to LCX and Diag   Past Surgical History  Procedure Date  . Coronary angioplasty with stent placement 12/15/11    "2"  . Abdominal hysterectomy 1992  . Appendectomy 06/1990  . Tubal ligation 1980's  . Av fistula placement  07/2009    left upper arm  . Thrombectomy / arteriovenous graft revision     left upper arm  . Colon resection 1992   Prior to Admission medications   Medication Sig Start Date End Date Taking? Authorizing Provider  allopurinol (ZYLOPRIM) 300 MG tablet Take 300 mg by mouth daily.   Yes Historical Provider, MD  ALPRAZolam Duanne Moron) 0.25 MG tablet Take 0.25 mg by mouth at bedtime as needed. For anxiety.   Yes Historical Provider, MD  amLODipine (NORVASC) 10 MG tablet Take 10 mg by mouth daily.   Yes Historical Provider, MD  aspirin EC 81 MG tablet Take 81 mg by mouth daily.   Yes Historical Provider, MD  atorvastatin (LIPITOR) 20 MG tablet Take 20 mg by mouth daily.   Yes Historical Provider, MD  cloNIDine (CATAPRES) 0.2 MG tablet Take 0.2 mg by mouth 2 (two) times daily.   Yes Historical Provider, MD  fluconazole (DIFLUCAN) 100 MG tablet Take 100 mg by mouth daily.   Yes Historical Provider, MD  folic acid-vitamin b complex-vitamin c-selenium-zinc (DIALYVITE) 3 MG TABS Take 1 tablet by mouth daily.   Yes Historical Provider, MD  iron polysaccharides (NIFEREX) 150 MG capsule Take 150 mg by mouth 2 (two) times daily.   Yes Historical Provider, MD  labetalol (NORMODYNE) 200 MG tablet  Take 200 mg by mouth 2 (two) times daily. 12/16/11  Yes Rogelia Mire, NP  lanthanum (FOSRENOL) 500 MG chewable tablet Chew 1,000 mg by mouth 3 (three) times daily with meals.   Yes Historical Provider, MD  loratadine (CLARITIN) 10 MG tablet Take 10 mg by mouth daily as needed. For allergies.   Yes Historical Provider, MD  nitroGLYCERIN (NITROSTAT) 0.4 MG SL tablet Place 0.4 mg under the tongue every 5 (five) minutes as needed. For chest pain.   Yes Historical Provider, MD  pantoprazole (PROTONIX) 20 MG tablet Take 20 mg by mouth daily.   Yes Historical Provider, MD  Ticagrelor (BRILINTA) 90 MG TABS tablet Take 90 mg by mouth 2 (two) times daily.   Yes Historical Provider, MD   Allergies Allergies  Allergen  Reactions  . Aspirin Other (See Comments)    Mess up her stomach; "makes my bowels have blood in them"  . Contrast Media (Iodinated Diagnostic Agents) Itching  . Macrodantin (Nitrofurantoin Macrocrystal) Other (See Comments)    "broke me out in big old knots all over my body; had to go to ER"  . Penicillins Other (See Comments)    "makes me real weak when I take it; like I'll pass out"  . Bactrim (Sulfamethoxazole W-Trimethoprim) Rash  . Sulfa Antibiotics Rash  . Plavix (Clopidogrel Bisulfate) Rash    Family History History reviewed. No pertinent family history. Social History  reports that she has never smoked. She has never used smokeless tobacco. She reports that she does not drink alcohol or use illicit drugs.  Review Of Systems:  Negative except for what I mentioned in the HPI   Current Status:  Vital Signs: Temp:  [97.6 F (36.4 C)-99.8 F (37.7 C)] 97.9 F (36.6 C) (07/07 2300) Pulse Rate:  [78-106] 87  (07/07 2330) Resp:  [17-22] 17  (07/07 2330) BP: (137-193)/(62-90) 193/68 mmHg (07/07 2330) SpO2:  [96 %-100 %] 100 % (07/07 2330) Weight:  [139 lb 15.9 oz (63.5 kg)] 139 lb 15.9 oz (63.5 kg) (07/07 0414)  Physical Examination: General:  Pleasant female patient not in acute distress Neuro:  Awake, alert, nonfocal HEENT:  PERRL, pink conjunctivae, moist membranes Neck:  Supple, no JVD   Cardiovascular:  RRR, no M/R/G Lungs:  CTA, slightly diminished breath sounds, no W/R/R Abdomen:  Soft, nontender, nondistended, bowel sounds present Musculoskeletal:  Moves all extremities, no pedal edema Skin:  No rash   Lab 01/11/12 1502 01/10/12 1529 01/10/12 1525 01/10/12 1232 01/09/12 1628  PHART 7.447* -- 7.459* 7.495* 7.528*  PCO2ART 36.0 -- 41.2 39.0 34.2*  PO2ART 162.0* -- 521.0* 149.0* 131.0*  HCO3 24.9* 26.0* 29.2* 30.1* 28.3*  O2SAT 100.0 81.0 100.0 99.0 99.0    Lab 01/12/12 0100 01/11/12 1701 01/10/12 0639 01/09/12 2126 01/09/12 1528 01/09/12 1517  TROPONINI  0.98* 0.59* 0.79* 1.44* 1.56* --  LATICACIDVEN -- -- -- -- -- 1.7  PROBNP -- -- -- -- 9339.0* --    Lab 01/15/12 0500 01/14/12 0730 01/13/12 0430 01/12/12 1320 01/12/12 0350 01/09/12 1517  NA 138 136 142 139 135 --  K 3.9 4.4 -- -- -- --  CL 94* 91* 94* 93* 92* --  CO2 26 23 28 24 23  --  BUN 55* 86* 42* 21 74* --  CREATININE 6.39* 8.97* 5.75* 3.42* 9.26* --  CALCIUM 9.6 9.0 9.6 9.5 8.5 --  MG -- -- -- -- -- 1.4*  PHOS -- -- -- -- -- 3.7   Intake/Output  07/07 0701 - 07/08 0700   P.O. 720   I.V. (mL/kg) 3 (0)   Total Intake(mL/kg) 723 (11.4)   Net +723       Urine Occurrence      Lab 01/11/12 0430 01/09/12 1517  AST 18 32  ALT 10 14  ALKPHOS 60 76  BILITOT 0.3 0.5  PROT 6.2 6.2  ALBUMIN 3.1* 3.1*    Lab 01/15/12 2209 01/13/12 0430 01/12/12 0350 01/11/12 0430 01/10/12 1525 01/10/12 1431 01/10/12 0430  HGB 9.8* 10.4* 7.6* 8.3* 10.5* -- --  HCT 30.5* 32.1* 23.8* 26.2* 31.0* -- --  PLT 362 305 251 268 -- 299 --  INR -- -- -- -- -- -- 1.24  APTT -- -- -- -- -- -- --    Lab 01/15/12 2209 01/13/12 0430 01/12/12 0350 01/11/12 0430 01/10/12 1431  WBC 11.7* 8.3 9.5 13.8* 12.4*  PROCALCITON -- -- -- -- --    Lab 01/14/12 0658 01/13/12 2141 01/13/12 1539 01/13/12 1133 01/13/12 0822  GLUCAP 105* 165* 147* 144* 105*    Principal Problem:  *CAD (coronary artery disease) Active Problems:  HTN (hypertension)  Hyperlipidemia  ESRD (end stage renal disease)  Acute systolic CHF (congestive heart failure)  Acute respiratory failure  Acute MI  GERD (gastroesophageal reflux disease)   ASSESSMENT AND PLAN  71 year old female with ESRD on HD, hx of ovarian and colon cancer, recent DES, admitted concerns stent thrombosis vs NSTEMI.  Developed acute resp failure secondary to pulmonary edema requiring endotracheal intubation. She was successfully extubated on 01/12/12. Cardiac cath showed ostial RCA now post DES stent on dual antiplatelet therapy with aspirin and Brilinta.  Today developed hemoptysis. She is hemodynamically stable, saturating 98% on RA, Hgb is stable. Chest X ray did not show acute infiltrates that could represent the source of bleeding. Still in the differential are bleeding from the lung and lower airways but more likely this represents a laryngeal bleeding likely secondary to trauma from endotracheal intubation.  RECOMMENDATIONS: - Agree with close monitoring in the CICU - Given that the patient is stable I think the benefit of continuing antiplatelet therapy out weights the risk of worsening bleeding.  - Will order CT scan of the chest w/o contrast to assess for parenchymal bleed or endobronchial lesions. - Would recommend ENT consult in am to asses upper airway. - We will consider bronchoscopy if no clear source of bleeding. - Follow up CBC in am   Waynetta Pean, M.D. Pulmonary and [place] Pager: [phone]  01/16/2012, 12:21 AM

## 2012-01-16 NOTE — Progress Notes (Signed)
CARDIAC REHAB PHASE I   PRE:  Rate/Rhythm: 89SR  BP:  Supine: 178/90  Sitting:   Standing:    SaO2: 100%2.5L, 97%RA  MODE:  Ambulation: 220 ft   POST:  Rate/Rhythem: 103  BP:  Supine:   Sitting: 180/69  Standing:    SaO2: 93-94%RA 1030-1102 Pt finished breakfast prior to walk. Pt walked 220 ft on RA with rolling walker and asst x 2. Did not need to sit to rest. Tolerated well. BP elevated pre and post walk. Pt coughing up thick, bloody phlegm frequently. Back to bed after walk as pt stated it was almost time for HD. Call bell in reach.  Jeani Sow

## 2012-01-16 NOTE — Progress Notes (Signed)
Subjective: Interval History: has complaints SOB & coughing up blood last pm .  Objective: Vital signs in last 24 hours: Temp:  [97.6 F (36.4 C)-99.8 F (37.7 C)] 98.6 F (37 C) (07/08 0730) Pulse Rate:  [74-106] 84  (07/08 1303) Resp:  [13-23] 22  (07/08 1251) BP: (137-200)/(55-104) 197/79 mmHg (07/08 1303) SpO2:  [97 %-100 %] 98 % (07/08 1251) Weight:  [63.9 kg (140 lb 14 oz)-64.3 kg (141 lb 12.1 oz)] 63.9 kg (140 lb 14 oz) (07/08 1251) Weight change: 0.4 kg (14.1 oz)  Intake/Output from previous day: 07/07 0701 - 07/08 0700 In: 723 [P.O.:720; I.V.:3] Out: -  Intake/Output this shift: Total I/O In: 90 [P.O.:90] Out: -   General appearance: pale Resp: rhonchi bibasilar and wheezes bibasilar Cardio: S1, S2 normal and systolic murmur: systolic ejection 2/6, decrescendo at 2nd left intercostal space GI: liver down 4 cm, pos bs soft Extremities: edema 1+. AVF :UA and LUA avg  Lab Results:  Basename 01/16/12 0440 01/15/12 2209  WBC 10.4 11.7*  HGB 10.2* 9.8*  HCT 31.9* 30.5*  PLT 335 362   BMET:  Basename 01/16/12 0440 01/15/12 0500  NA 133* 138  K 4.6 3.9  CL 90* 94*  CO2 26 26  GLUCOSE 120* 103*  BUN 83* 55*  CREATININE 8.89* 6.39*  CALCIUM 9.7 9.6   No results found for this basename: PTH:2 in the last 72 hours Iron Studies: No results found for this basename: IRON,TIBC,TRANSFERRIN,FERRITIN in the last 72 hours  Studies/Results: Ct Chest Wo Contrast  01/16/2012  *RADIOLOGY REPORT*  Clinical Data: Persistent chest congestion and difficulty breathing.  Hemoptysis.  CT CHEST WITHOUT CONTRAST  Technique:  Multidetector CT imaging of the chest was performed following the standard protocol without IV contrast.  Comparison: Chest radiograph 01/15/2012.  Findings: No pathologically enlarged mediastinal or axillary lymph nodes.  Hilar regions are difficult to definitively evaluate without IV contrast.  Extensive coronary artery calcification. Heart is mildly enlarged.   No pericardial effusion. Difficult to exclude eccentric thickening of the upper esophagus (image 13). Portions of the esophagus are air filled and dilated.  Mild air space disease is seen dependently in both lower lobes.  No pleural fluid.  Incidental imaging of the upper abdomen shows low attenuation lesions in the upper pole right kidney, as on 11/02/2010.  Kidneys are atrophic.  Retained oral contrast in the splenic flexure of the colon.  No worrisome lytic or sclerotic lesions.  IMPRESSION:  1.  Minimal airspace disease in both lower lobes, possibly due to resolving pneumonia and/or atelectasis. 2.  Difficult to exclude eccentric thickening of the upper esophagus.  If clinically warranted, an esophagram could be performed in further evaluation. 3.  Extensive coronary artery calcification.  Original Report Authenticated By: Luretha Rued, M.D.   Dg Chest Port 1 View  01/15/2012  *RADIOLOGY REPORT*  Clinical Data: Short of breath  PORTABLE CHEST - 1 VIEW  Comparison: 1400 hours  Findings: Mild cardiomegaly.  Improved bibasilar atelectasis. Upper lungs are clear.  No pneumothorax.  IMPRESSION: Improved bibasilar atelectasis.  Original Report Authenticated By: Jamas Lav, M.D.   Dg Chest Port 1 View  01/15/2012  *RADIOLOGY REPORT*  Clinical Data: Productive cough.  End-stage renal disease on hemodialysis.  PORTABLE CHEST - 1 VIEW 01/15/2012 1415 hours:  Comparison: Portable chest x-ray 01/12/2012 dating back to 01/09/2012 [facility] and 09/19/2011 [facility].  Findings: Interval extubation and nasogastric tube removal. Cardiac silhouette enlarged but stable.  Pulmonary vascularity  normal without evidence pulmonary edema.  Mild atelectasis in the lung bases with improved aeration since the prior examination.  No new pulmonary parenchymal abnormalities.  IMPRESSION: Mild bibasilar atelectasis post-extubation, though aeration has improved since examination 3 days ago.  No new  abnormalities. Stable cardiomegaly without pulmonary edema.  Original Report Authenticated By: Deniece Portela, M.D.    I have reviewed the patient's current medications.  Assessment/Plan: 1 ESRD vol xs, Htn 2 Anema add epo, check fe 3 HPTH 4 CAD 5 HTN lower vol 6 Cough with hemoptysis lower vol 7^ lipids  P HD, epo, check fe    LOS: 7 days   Shawna [name] L 01/16/2012,1:11 PM

## 2012-01-17 LAB — CBC WITH DIFFERENTIAL/PLATELET
Eosinophils Absolute: 0.5 10*3/uL (ref 0.0–0.7)
Eosinophils Relative: 4 % (ref 0–5)
Hemoglobin: 9.3 g/dL — ABNORMAL LOW (ref 12.0–15.0)
Lymphocytes Relative: 9 % — ABNORMAL LOW (ref 12–46)
Lymphs Abs: 1 10*3/uL (ref 0.7–4.0)
MCH: 29.6 pg (ref 26.0–34.0)
MCV: 92.4 fL (ref 78.0–100.0)
Monocytes Relative: 12 % (ref 3–12)
Neutrophils Relative %: 74 % (ref 43–77)
Platelets: 377 10*3/uL (ref 150–400)
RBC: 3.14 MIL/uL — ABNORMAL LOW (ref 3.87–5.11)
WBC: 10.5 10*3/uL (ref 4.0–10.5)

## 2012-01-17 LAB — COMPREHENSIVE METABOLIC PANEL
AST: 19 U/L (ref 0–37)
Albumin: 3.3 g/dL — ABNORMAL LOW (ref 3.5–5.2)
BUN: 33 mg/dL — ABNORMAL HIGH (ref 6–23)
Calcium: 9.9 mg/dL (ref 8.4–10.5)
Creatinine, Ser: 5.13 mg/dL — ABNORMAL HIGH (ref 0.50–1.10)

## 2012-01-17 LAB — IRON AND TIBC
Iron: 44 ug/dL (ref 42–135)
Saturation Ratios: 17 % — ABNORMAL LOW (ref 20–55)
TIBC: 252 ug/dL (ref 250–470)
UIBC: 208 ug/dL (ref 125–400)

## 2012-01-17 LAB — PHOSPHORUS: Phosphorus: 3.5 mg/dL (ref 2.3–4.6)

## 2012-01-17 MED ORDER — PANTOPRAZOLE SODIUM 40 MG PO TBEC
40.0000 mg | DELAYED_RELEASE_TABLET | Freq: Two times a day (BID) | ORAL | Status: DC
  Administered 2012-01-17 – 2012-01-19 (×4): 40 mg via ORAL
  Filled 2012-01-17 (×4): qty 1

## 2012-01-17 MED ORDER — LIDOCAINE HCL (PF) 1 % IJ SOLN: 5.0000 mL | INTRAMUSCULAR | Status: DC | PRN

## 2012-01-17 MED ORDER — GUAIFENESIN ER 600 MG PO TB12
600.0000 mg | ORAL_TABLET | Freq: Two times a day (BID) | ORAL | Status: DC
  Administered 2012-01-17 – 2012-01-25 (×17): 600 mg via ORAL
  Filled 2012-01-17 (×19): qty 1

## 2012-01-17 MED ORDER — HEPARIN SODIUM (PORCINE) 1000 UNIT/ML DIALYSIS: 1000.0000 [IU] | INTRAMUSCULAR | Status: DC | PRN

## 2012-01-17 MED ORDER — SODIUM CHLORIDE 0.9 % IV SOLN: 100.0000 mL | INTRAVENOUS | Status: DC | PRN

## 2012-01-17 MED ORDER — PENTAFLUOROPROP-TETRAFLUOROETH EX AERO: 1.0000 "application " | INHALATION_SPRAY | CUTANEOUS | Status: DC | PRN

## 2012-01-17 MED ORDER — HEPARIN SODIUM (PORCINE) 1000 UNIT/ML DIALYSIS
40.0000 [IU]/kg | Freq: Once | INTRAMUSCULAR | Status: DC
Start: 2012-01-17 — End: 2012-01-19
  Filled 2012-01-17: qty 3

## 2012-01-17 MED ORDER — SODIUM CHLORIDE 0.9 % IV SOLN
125.0000 mg | INTRAVENOUS | Status: DC
  Administered 2012-01-21 – 2012-01-25 (×3): 125 mg via INTRAVENOUS
  Filled 2012-01-17 (×7): qty 10

## 2012-01-17 MED ORDER — ALTEPLASE 2 MG IJ SOLR
2.0000 mg | Freq: Once | INTRAMUSCULAR | Status: AC | PRN
  Filled 2012-01-17: qty 2

## 2012-01-17 MED ORDER — LIDOCAINE-PRILOCAINE 2.5-2.5 % EX CREA: 1.0000 "application " | TOPICAL_CREAM | CUTANEOUS | Status: DC | PRN

## 2012-01-17 MED ORDER — NEPRO/CARBSTEADY PO LIQD
237.0000 mL | ORAL | Status: DC | PRN
  Filled 2012-01-17: qty 237

## 2012-01-17 NOTE — Progress Notes (Signed)
Subjective:  Still has thick cough.  No further coughing of blood. CT scan of chest unremarkable. Will add mucinex for cough Objective:  Vital Signs in the last 24 hours: Temp:  [97.3 F (36.3 C)-99.5 F (37.5 C)] 99.3 F (37.4 C) (07/09 0700) Pulse Rate:  [69-100] 87  (07/09 0700) Resp:  [15-26] 17  (07/09 0700) BP: (113-200)/(50-84) 169/56 mmHg (07/09 0700) SpO2:  [98 %-100 %] 100 % (07/09 0700) Weight:  [131 lb 13.4 oz (59.8 kg)-140 lb 14 oz (63.9 kg)] 131 lb 13.4 oz (59.8 kg) (07/09 0619)  Intake/Output from previous day: 07/08 0701 - 07/09 0700 In: 270 [P.O.:270] Out: 1100  Intake/Output from this shift:       . albuterol      . allopurinol  300 mg Oral Daily  . ALPRAZolam  0.25 mg Oral Once  . amLODipine  5 mg Oral QHS  . aspirin  81 mg Oral Daily  . atorvastatin  20 mg Oral q1800  . darbepoetin (ARANESP) injection - DIALYSIS  60 mcg Intravenous Q Mon-HD  . ferric gluconate (FERRLECIT/NULECIT) IV  125 mg Intravenous Q M,W,F-HD  . fluticasone  2 spray Each Nare Daily  . guaiFENesin  600 mg Oral BID  . lanthanum  1,000 mg Oral TID WC  . loratadine  10 mg Oral Daily  . metoprolol tartrate  12.5 mg Oral BID  . multivitamin  1 tablet Oral QHS  . pantoprazole  20 mg Oral Q1200  . sodium chloride  3 mL Intravenous Q12H  . Ticagrelor  90 mg Oral BID  . DISCONTD: amLODipine  10 mg Oral Daily  . DISCONTD: amLODipine  10 mg Oral QHS  . DISCONTD: cloNIDine  0.1 mg Oral BID  . DISCONTD: cloNIDine  0.1 mg Oral TID  . DISCONTD: iron polysaccharides  150 mg Oral BID WC      . sodium chloride 10 mL/hr at 01/11/12 2130  . dextrose 5 % and 0.45% NaCl Stopped (01/13/12 1000)    Physical Exam: The patient appears to be in no distress.  Head and neck exam reveals that the pupils are equal and reactive.  The extraocular movements are full.  There is no scleral icterus.  Mouth and pharynx are benign.  No lymphadenopathy.  No carotid bruits.  The jugular venous pressure is  normal.  Thyroid is not enlarged or tender. No active bleeding seen from mouth.  Chest reveals scattered rhonchi  Heart reveals no abnormal lift or heave.  First and second heart sounds are normal.  There is no murmur gallop rub or click.  The abdomen is soft and nontender.  Bowel sounds are normoactive.  There is no hepatosplenomegaly or mass.  There are no abdominal bruits.  Extremities reveal no phlebitis or edema.  Pedal pulses are good.  There is no cyanosis or clubbing.  Neurologic exam is normal strength and no lateralizing weakness.  No sensory deficits.  Integument reveals no rash  Lab Results:  Basename 01/17/12 0528 01/16/12 0440  WBC 10.5 10.4  HGB 9.3* 10.2*  PLT 377 335    Basename 01/17/12 0528 01/16/12 0440  NA 139 133*  K 3.9 4.6  CL 95* 90*  CO2 28 26  GLUCOSE 119* 120*  BUN 33* 83*  CREATININE 5.13* 8.89*   No results found for this basename: TROPONINI:2,CK,MB:2 in the last 72 hours Hepatic Function Panel  Basename 01/17/12 0528  PROT 6.7  ALBUMIN 3.3*  AST 19  ALT 17  ALKPHOS 59  BILITOT 0.2*  BILIDIR --  IBILI --   No results found for this basename: CHOL in the last 72 hours No results found for this basename: PROTIME in the last 72 hours  Imaging: Ct Chest Wo Contrast  01/16/2012  *RADIOLOGY REPORT*  Clinical Data: Persistent chest congestion and difficulty breathing.  Hemoptysis.  CT CHEST WITHOUT CONTRAST  Technique:  Multidetector CT imaging of the chest was performed following the standard protocol without IV contrast.  Comparison: Chest radiograph 01/15/2012.  Findings: No pathologically enlarged mediastinal or axillary lymph nodes.  Hilar regions are difficult to definitively evaluate without IV contrast.  Extensive coronary artery calcification. Heart is mildly enlarged.  No pericardial effusion. Difficult to exclude eccentric thickening of the upper esophagus (image 13). Portions of the esophagus are air filled and dilated.  Mild air  space disease is seen dependently in both lower lobes.  No pleural fluid.  Incidental imaging of the upper abdomen shows low attenuation lesions in the upper pole right kidney, as on 11/02/2010.  Kidneys are atrophic.  Retained oral contrast in the splenic flexure of the colon.  No worrisome lytic or sclerotic lesions.  IMPRESSION:  1.  Minimal airspace disease in both lower lobes, possibly due to resolving pneumonia and/or atelectasis. 2.  Difficult to exclude eccentric thickening of the upper esophagus.  If clinically warranted, an esophagram could be performed in further evaluation. 3.  Extensive coronary artery calcification.  Original Report Authenticated By: Luretha Rued, M.D.   Dg Chest Port 1 View  01/15/2012  *RADIOLOGY REPORT*  Clinical Data: Short of breath  PORTABLE CHEST - 1 VIEW  Comparison: 1400 hours  Findings: Mild cardiomegaly.  Improved bibasilar atelectasis. Upper lungs are clear.  No pneumothorax.  IMPRESSION: Improved bibasilar atelectasis.  Original Report Authenticated By: Jamas Lav, M.D.   Dg Chest Port 1 View  01/15/2012  *RADIOLOGY REPORT*  Clinical Data: Productive cough.  End-stage renal disease on hemodialysis.  PORTABLE CHEST - 1 VIEW 01/15/2012 1415 hours:  Comparison: Portable chest x-ray 01/12/2012 dating back to 01/09/2012 [facility] and 09/19/2011 [facility].  Findings: Interval extubation and nasogastric tube removal. Cardiac silhouette enlarged but stable.  Pulmonary vascularity normal without evidence pulmonary edema.  Mild atelectasis in the lung bases with improved aeration since the prior examination.  No new pulmonary parenchymal abnormalities.  IMPRESSION: Mild bibasilar atelectasis post-extubation, though aeration has improved since examination 3 days ago.  No new abnormalities. Stable cardiomegaly without pulmonary edema.  Original Report Authenticated By: Deniece Portela, M.D.    Cardiac Studies: Telemetry  NSR Assessment/Plan:  Patient Active Hospital Problem List: CAD (coronary artery disease) (12/16/2011)   Assessment: On ASA and Brilinta for recent DES.   Plan: Continue same meds. HTN (hypertension) (12/16/2011)   Assessment: Systolic BP still high   Plan: Increase clonidine to TID  ESRD (end stage renal disease) (12/16/2011)   Assessment: Stable   Plan: Continue HD Hemoptysis    Resolved.  Plan: PT to ambulate today.  Consider discharge tomorrow after HD    LOS: 8 days    Darlin Coco 01/17/2012, 8:58 AM

## 2012-01-17 NOTE — Progress Notes (Signed)
Subjective: Interval History: has no complaint of cough better.  Objective: Vital signs in last 24 hours: Temp:  [97.3 F (36.3 C)-99.5 F (37.5 C)] 99.3 F (37.4 C) (07/09 0700) Pulse Rate:  [69-100] 87  (07/09 0700) Resp:  [15-26] 17  (07/09 0700) BP: (113-200)/(50-84) 169/56 mmHg (07/09 0700) SpO2:  [98 %-100 %] 100 % (07/09 0700) Weight:  [59.8 kg (131 lb 13.4 oz)-63.9 kg (140 lb 14 oz)] 59.8 kg (131 lb 13.4 oz) (07/09 0619) Weight change: -0.4 kg (-14.1 oz)  Intake/Output from previous day: 07/08 0701 - 07/09 0700 In: 270 [P.O.:270] Out: 1100  Intake/Output this shift:    General appearance: alert and pale Resp: diminished breath sounds bilaterally Cardio: S1, S2 normal and systolic murmur: holosystolic 2/6, blowing at apex GI: liver down 4 cm Skin: Skin color, texture, turgor normal. No rashes or lesions or pale  Lab Results:  [facility] 01/17/12 0528 01/16/12 0440  WBC 10.5 10.4  HGB 9.3* 10.2*  HCT 29.0* 31.9*  PLT 377 335   BMET:  Basename 01/17/12 0528 01/16/12 0440  NA 139 133*  K 3.9 4.6  CL 95* 90*  CO2 28 26  GLUCOSE 119* 120*  BUN 33* 83*  CREATININE 5.13* 8.89*  CALCIUM 9.9 9.7   No results found for this basename: PTH:2 in the last 72 hours Iron Studies:  Basename 01/16/12 1330  IRON 44  TIBC 252  TRANSFERRIN --  FERRITIN --    Studies/Results: Ct Chest Wo Contrast  01/16/2012  *RADIOLOGY REPORT*  Clinical Data: Persistent chest congestion and difficulty breathing.  Hemoptysis.  CT CHEST WITHOUT CONTRAST  Technique:  Multidetector CT imaging of the chest was performed following the standard protocol without IV contrast.  Comparison: Chest radiograph 01/15/2012.  Findings: No pathologically enlarged mediastinal or axillary lymph nodes.  Hilar regions are difficult to definitively evaluate without IV contrast.  Extensive coronary artery calcification. Heart is mildly enlarged.  No pericardial effusion. Difficult to exclude eccentric thickening of  the upper esophagus (image 13). Portions of the esophagus are air filled and dilated.  Mild air space disease is seen dependently in both lower lobes.  No pleural fluid.  Incidental imaging of the upper abdomen shows low attenuation lesions in the upper pole right kidney, as on 11/02/2010.  Kidneys are atrophic.  Retained oral contrast in the splenic flexure of the colon.  No worrisome lytic or sclerotic lesions.  IMPRESSION:  1.  Minimal airspace disease in both lower lobes, possibly due to resolving pneumonia and/or atelectasis. 2.  Difficult to exclude eccentric thickening of the upper esophagus.  If clinically warranted, an esophagram could be performed in further evaluation. 3.  Extensive coronary artery calcification.  Original Report Authenticated By: Luretha Rued, M.D.   Dg Chest Port 1 View  01/15/2012  *RADIOLOGY REPORT*  Clinical Data: Short of breath  PORTABLE CHEST - 1 VIEW  Comparison: 1400 hours  Findings: Mild cardiomegaly.  Improved bibasilar atelectasis. Upper lungs are clear.  No pneumothorax.  IMPRESSION: Improved bibasilar atelectasis.  Original Report Authenticated By: Jamas Lav, M.D.   Dg Chest Port 1 View  01/15/2012  *RADIOLOGY REPORT*  Clinical Data: Productive cough.  End-stage renal disease on hemodialysis.  PORTABLE CHEST - 1 VIEW 01/15/2012 1415 hours:  Comparison: Portable chest x-ray 01/12/2012 dating back to 01/09/2012 [facility] and 09/19/2011 [facility].  Findings: Interval extubation and nasogastric tube removal. Cardiac silhouette enlarged but stable.  Pulmonary vascularity normal without evidence pulmonary edema.  Mild  atelectasis in the lung bases with improved aeration since the prior examination.  No new pulmonary parenchymal abnormalities.  IMPRESSION: Mild bibasilar atelectasis post-extubation, though aeration has improved since examination 3 days ago.  No new abnormalities. Stable cardiomegaly without pulmonary edema.  Original Report  Authenticated By: Deniece Portela, M.D.    I have reviewed the patient's current medications.  Assessment/Plan: 1 CRf for HD in am, longer, slower UF 2 HTn did not get Norvasc ?? Why 3 Anemia low Fe, give iv 4 CAD per Card 5 Cough ? Mild bronchitis 6 HPTH  P HD, epo/fe,    LOS: 8 days   Avanish Cerullo L 01/17/2012,8:59 AM

## 2012-01-17 NOTE — Consult Note (Addendum)
Name: Shawna [name] MRN: [id number] DOB: 09-10-1940    LOS: 25  PULMONARY / CRITICAL CARE MEDICINE CONSULT  HPI:   The patient is a 71 year old female ESRD on HD, hx of ovarian and colon cancer, recent DES, admitted concerns stent thrombosis vs NSTEMI.  Developed acute resp failure secondary to pulmonary edema requiring endotracheal intubation. She was successfully extubated on 01/12/12. Cardiac cath showed ostial RCA now post DES stent on dual antiplatelet therapy with aspirin and Brilinta. Today a rapid response was called for an episode of SOB and hemoptysis. Pulmonary consult was called for possible bronchoscopy to rule out endobronchial lesion. De patient states that she has coughed up about 1/2 a cup of bright red blood. Las episode about half an hour ago. The patient coughed while I was in the room and currently is phlegm mixed with blood. The patient states that she feels like the blood is coming from her larynx. Denies CP, no SOB, only mild discomfort because of secretions. At the time of my exam awake, alert, oriented x 3, hemodynamically stable, saturating 98% on RA.  Current Status: No hemoptysis  Vital Signs: Temp:  [97.3 F (36.3 C)-99.5 F (37.5 C)] 98.7 F (37.1 C) (07/09 1200) Pulse Rate:  [76-100] 85  (07/09 1200) Resp:  [15-26] 18  (07/09 1200) BP: (113-198)/(50-84) 168/68 mmHg (07/09 1200) SpO2:  [99 %-100 %] 99 % (07/09 1200) Weight:  [59.8 kg (131 lb 13.4 oz)-62.9 kg (138 lb 10.7 oz)] 59.8 kg (131 lb 13.4 oz) (07/09 [id number])  Physical Examination: General:  Pleasant female patient not in acute distress in chair Neuro:  Awake, alert, nonfocal HEENT:  PERRL, pink conjunctivae, moist membranes Neck:  Supple, no JVD , mild expiratory coarse BS, horse voice Cardiovascular:  RRR, no M/R/G Lungs:  CTA, slightly diminished Abdomen:  Soft, nontender, nondistended, bowel sounds present Musculoskeletal:  Moves all extremities, no pedal edema Skin:  No rash   Lab 01/11/12 1502  01/10/12 1529 01/10/12 1525 01/10/12 1232 01/09/12 1628  PHART 7.447* -- 7.459* 7.495* 7.528*  PCO2ART 36.0 -- 41.2 39.0 34.2*  PO2ART 162.0* -- 521.0* 149.0* 131.0*  HCO3 24.9* 26.0* 29.2* 30.1* 28.3*  O2SAT 100.0 81.0 100.0 99.0 99.0    Lab 01/12/12 0100 01/11/12 1701 01/10/12 0639 01/09/12 2126 01/09/12 1528 01/09/12 1517  TROPONINI 0.98* 0.59* 0.79* 1.44* 1.56* --  LATICACIDVEN -- -- -- -- -- 1.7  PROBNP -- -- -- -- 9339.0* --    Lab 01/15/12 0500 01/14/12 0730 01/13/12 0430 01/12/12 1320 01/12/12 0350 01/09/12 1517  NA 138 136 142 139 135 --  K 3.9 4.4 -- -- -- --  CL 94* 91* 94* 93* 92* --  CO2 26 23 28 24 23  --  BUN 55* 86* 42* 21 74* --  CREATININE 6.39* 8.97* 5.75* 3.42* 9.26* --  CALCIUM 9.6 9.0 9.6 9.5 8.5 --  MG -- -- -- -- -- 1.4*  PHOS -- -- -- -- -- 3.7   Intake/Output      07/07 0701 - 07/08 0700   P.O. 720   I.V. (mL/kg) 3 (0)   Total Intake(mL/kg) 723 (11.4)   Net +723       Urine Occurrence      Lab 01/11/12 0430 01/09/12 1517  AST 18 32  ALT 10 14  ALKPHOS 60 76  BILITOT 0.3 0.5  PROT 6.2 6.2  ALBUMIN 3.1* 3.1*    Lab 01/15/12 2209 01/13/12 0430 01/12/12 0350 01/11/12 0430 01/10/12 1525 01/10/12 1431 01/10/12 0430  HGB 9.8* 10.4* 7.6* 8.3* 10.5* -- --  HCT 30.5* 32.1* 23.8* 26.2* 31.0* -- --  PLT 362 305 251 268 -- 299 --  INR -- -- -- -- -- -- 1.24  APTT -- -- -- -- -- -- --    Lab 01/15/12 2209 01/13/12 0430 01/12/12 0350 01/11/12 0430 01/10/12 1431  WBC 11.7* 8.3 9.5 13.8* 12.4*  PROCALCITON -- -- -- -- --    Lab 01/14/12 0658 01/13/12 2141 01/13/12 1539 01/13/12 1133 01/13/12 0822  GLUCAP 105* 165* 147* 144* 105*    Principal Problem:  *CAD (coronary artery disease) Active Problems:  HTN (hypertension)  Hyperlipidemia  ESRD (end stage renal disease)  Acute systolic CHF (congestive heart failure)  Acute respiratory failure  Acute MI  GERD (gastroesophageal reflux disease)   ASSESSMENT AND PLAN  70 year old female with  ESRD on HD, hx of ovarian and colon cancer, recent DES, admitted concerns stent thrombosis vs NSTEMI.  Developed acute resp failure secondary to pulmonary edema requiring endotracheal intubation. She was successfully extubated on 01/12/12. Cardiac cath showed ostial RCA now post DES stent on dual antiplatelet therapy with aspirin and Brilinta. Today developed hemoptysis. She is hemodynamically stable, saturating 98% on RA, Hgb is stable. Chest X ray did not show acute infiltrates that could represent the source of bleeding. Still in the differential are bleeding from the lung and lower airways but more likely this represents a laryngeal bleeding likely secondary to trauma from endotracheal intubation. -throat horseness, s/p prior intubation  RECOMMENDATIONS: - resolved hemoptysis -cough suppressant -CT chest reviewed, no mass lesions -if reoccur, ddavp to consider, caution with Stent -monitor reoccurrence on brilanta with cough suppressant  Agree ENt assessment, source of hemoptysis? Consider scope Did have ETT, inflammation in setting brilanta ? May require neck CT  Will sign off call if needed  [name]. Titus Mould, MD, [place] Pgr: [facility]

## 2012-01-17 NOTE — Progress Notes (Signed)
Physical Therapy Treatment Patient Details Name: Shawna [name] MRN: [id number] DOB: 07-Sep-1940 Today's Date: 01/17/2012 Time: 1420-1450 PT Time Calculation (min): 30 min  PT Assessment / Plan / Recommendation Comments on Treatment Session  Pt s/p NSTEMI with acute CHF and s/p intubation. Pt has progressed well and from a PT standpoint can go home with 24 hr assist (husband can provide) and HHPT.     Follow Up Recommendations  Home health PT;Supervision/Assistance - 24 hour    Barriers to Discharge        Equipment Recommendations  None recommended by PT    Recommendations for Other Services OT consult  Frequency Min 3X/week   Plan Discharge plan needs to be updated;Frequency remains appropriate    Precautions / Restrictions Precautions Precautions: Fall   Pertinent Vitals/Pain HR 89-118; SaO2 99% on RA    Mobility  Bed Mobility Bed Mobility: Not assessed Transfers Transfers: Sit to Stand;Stand to Sit Sit to Stand: 5: Supervision;With upper extremity assist;With armrests;From chair/3-in-1 Stand to Sit: 5: Supervision;With upper extremity assist;Without upper extremity assist;To chair/3-in-1 Details for Transfer Assistance: Pt required no cues for hand placement; supervision for safety; when attempting sit without UE support, pt with very uncontrolled descent Ambulation/Gait Ambulation/Gait Assistance: 4: Min guard Ambulation Distance (Feet): 220 Feet Assistive device: Rolling walker Ambulation/Gait Assistance Details: cues for keeping RW closer to her to incr safety and decr effort with walking  Gait Pattern: Step-through pattern;Trunk flexed;Wide base of support Gait velocity: decr Stairs: No (discussed steps to enter home; pt does not feel will be diff)    Exercises     PT Diagnosis:    PT Problem List:   PT Treatment Interventions:     PT Goals Acute Rehab PT Goals Pt will go Sit to Stand: Independently;without upper extremity assist PT Goal: Sit to Stand -  Progress: Progressing toward goal Pt will Ambulate: 51 - 150 feet;with supervision;with least restrictive assistive device PT Goal: Ambulate - Progress: Progressing toward goal  Visit Information  Last PT Received On: 01/17/12 Assistance Needed: +1    Subjective Data  Subjective: " I used to just jump in my car and go" re: her mobility PTA Patient Stated Goal: to get home   Cognition  Overall Cognitive Status: Appears within functional limits for tasks assessed/performed Arousal/Alertness: Awake/alert Orientation Level: Appears intact for tasks assessed Behavior During Session: [facility] for tasks performed    Balance  Balance Balance Assessed: Yes Static Standing Balance Static Standing - Balance Support: Bilateral upper extremity supported;During functional activity Static Standing - Level of Assistance: 5: Stand by assistance Static Standing - Comment/# of Minutes: able to statically stand with no LOB or incr sway Single Leg Stance - Right Leg: 3  (bil UE support on RW) Single Leg Stance - Left Leg: 5  (bil UE support on RW) Rhomberg - Eyes Opened: 30  Rhomberg - Eyes Closed: 15  (light UE support (tactile cues only, no assist for balance)) Dynamic Standing Balance Dynamic Standing - Balance Support: Bilateral upper extremity supported;During functional activity Dynamic Standing - Level of Assistance: 4: Min assist Dynamic Standing - Balance Activities: Forward lean/weight shifting;Reaching for objects;Other (comment) (toe raises; heel raises; marching) Dynamic Standing - Comments: initially pt lightly holding onto RW, then resting hands on PTs forearms with no physical support given   End of Session PT - End of Session Equipment Utilized During Treatment: Gait belt Activity Tolerance: Patient tolerated treatment well Patient left: in chair;with call bell/phone within reach Nurse Communication: Mobility status  GP     Dniyah Grant 01/17/2012, 3:13 PM Pager [phone]

## 2012-01-17 NOTE — Consult Note (Addendum)
ENT CONSULT:  Reason for Consult:Hemoptysis and hoarseness Referring Physician: Dr. Constance Holster Shawna [name] is an 71 y.o. female.  HPI: Hx and chart reviewed, Pt intubated for resp failure 2ndary to pulmonary edema. Extubated 5 day ago, c/o globus, cough and hemoptysis. Raspy voice and tightness. Tolerating po diet w/o aspiration. Hemoptysis resolved, cont asa and brilanta.  Past Medical History  Diagnosis Date  . ESRD (end stage renal disease) on dialysis 12/15/11    [place]; Mon; Wed; Fri  . Hypertension   . CHF (congestive heart failure)   . Anginal pain   . Chronic bronchitis 12/15/11    "have had it last 4 wk"  . Pneumonia ~ 2010  . Anemia   . History of blood transfusion 07/2011    "first time"  . GERD (gastroesophageal reflux disease)   . History of stomach ulcers   . History of lower GI bleeding   . Arthritis     "hands and feet"  . History of gout   . Anxiety   . Ovarian cancer 1992  . Colon cancer   . CAD (coronary artery disease)     a. 12/2011 s/p DES to LCX and Diag    Past Surgical History  Procedure Date  . Coronary angioplasty with stent placement 12/15/11    "2"  . Abdominal hysterectomy 1992  . Appendectomy 06/1990  . Tubal ligation 1980's  . Av fistula placement 07/2009    left upper arm  . Thrombectomy / arteriovenous graft revision     left upper arm  . Colon resection 1992    History reviewed. No pertinent family history.  Social History:  reports that she has never smoked. She has never used smokeless tobacco. She reports that she does not drink alcohol or use illicit drugs.  Allergies:  Allergies  Allergen Reactions  . Aspirin Other (See Comments)    Mess up her stomach; "makes my bowels have blood in them"  . Contrast Media (Iodinated Diagnostic Agents) Itching  . Macrodantin (Nitrofurantoin Macrocrystal) Other (See Comments)    "broke me out in big old knots all over my body; had to go to ER"  . Penicillins Other (See Comments)    "makes me  real weak when I take it; like I'll pass out"  . Bactrim (Sulfamethoxazole W-Trimethoprim) Rash  . Sulfa Antibiotics Rash  . Plavix (Clopidogrel Bisulfate) Rash    Medications: I have reviewed the patient's current medications.  Results for orders placed during the hospital encounter of 01/09/12 (from the past 48 hour(s))  CULTURE, EXPECTORATED SPUTUM-ASSESSMENT     Status: Normal   Collection Time   01/15/12  5:16 PM      Component Value Range Comment   Specimen Description SPUTUM      Special Requests NONE      Sputum evaluation        Value: MICROSCOPIC FINDINGS SUGGEST THAT THIS SPECIMEN IS NOT REPRESENTATIVE OF LOWER RESPIRATORY SECRETIONS. PLEASE RECOLLECT.     Results Called toDominga Ferry [id number] [facility]   Report Status 01/15/2012 FINAL     CBC     Status: Abnormal   Collection Time   01/15/12 10:09 PM      Component Value Range Comment   WBC 11.7 (*) 4.0 - 10.5 K/uL    RBC 3.37 (*) 3.87 - 5.11 MIL/uL    Hemoglobin 9.8 (*) 12.0 - 15.0 g/dL    HCT 30.5 (*) 36.0 - 46.0 %    MCV 90.5  78.0 - 100.0 fL    MCH 29.1  26.0 - 34.0 pg    MCHC 32.1  30.0 - 36.0 g/dL    RDW 15.9 (*) 11.5 - 15.5 %    Platelets 362  150 - 400 K/uL   BASIC METABOLIC PANEL     Status: Abnormal   Collection Time   01/16/12  4:40 AM      Component Value Range Comment   Sodium 133 (*) 135 - 145 mEq/L    Potassium 4.6  3.5 - 5.1 mEq/L    Chloride 90 (*) 96 - 112 mEq/L    CO2 26  19 - 32 mEq/L    Glucose, Bld 120 (*) 70 - 99 mg/dL    BUN 83 (*) 6 - 23 mg/dL DELTA CHECK NOTED   Creatinine, Ser 8.89 (*) 0.50 - 1.10 mg/dL    Calcium 9.7  8.4 - 10.5 mg/dL    GFR calc non Af Amer 4 (*) >90 mL/min    GFR calc Af Amer 5 (*) >90 mL/min   CBC     Status: Abnormal   Collection Time   01/16/12  4:40 AM      Component Value Range Comment   WBC 10.4  4.0 - 10.5 K/uL    RBC 3.53 (*) 3.87 - 5.11 MIL/uL    Hemoglobin 10.2 (*) 12.0 - 15.0 g/dL    HCT 31.9 (*) 36.0 - 46.0 %    MCV 90.4  78.0 - 100.0 fL    MCH  28.9  26.0 - 34.0 pg    MCHC 32.0  30.0 - 36.0 g/dL    RDW 15.8 (*) 11.5 - 15.5 %    Platelets 335  150 - 400 K/uL   CULTURE, EXPECTORATED SPUTUM-ASSESSMENT     Status: Normal   Collection Time   01/16/12  8:00 AM      Component Value Range Comment   Specimen Description SPUTUM      Special Requests NONE      Sputum evaluation        Value: MICROSCOPIC FINDINGS SUGGEST THAT THIS SPECIMEN IS NOT REPRESENTATIVE OF LOWER RESPIRATORY SECRETIONS. PLEASE RECOLLECT.     CALLED TO G ROGERS,RN 01/16/12 1123 BY K SCHULTZ   Report Status 01/16/2012 FINAL     IRON AND TIBC     Status: Abnormal   Collection Time   01/16/12  1:30 PM      Component Value Range Comment   Iron 44  42 - 135 ug/dL    TIBC 252  250 - 470 ug/dL    Saturation Ratios 17 (*) 20 - 55 %    UIBC 208  125 - 400 ug/dL   COMPREHENSIVE METABOLIC PANEL     Status: Abnormal   Collection Time   01/17/12  5:28 AM      Component Value Range Comment   Sodium 139  135 - 145 mEq/L    Potassium 3.9  3.5 - 5.1 mEq/L    Chloride 95 (*) 96 - 112 mEq/L    CO2 28  19 - 32 mEq/L    Glucose, Bld 119 (*) 70 - 99 mg/dL    BUN 33 (*) 6 - 23 mg/dL    Creatinine, Ser 5.13 (*) 0.50 - 1.10 mg/dL DELTA CHECK NOTED   Calcium 9.9  8.4 - 10.5 mg/dL    Total Protein 6.7  6.0 - 8.3 g/dL    Albumin 3.3 (*) 3.5 - 5.2 g/dL    AST  19  0 - 37 U/L    ALT 17  0 - 35 U/L    Alkaline Phosphatase 59  39 - 117 U/L    Total Bilirubin 0.2 (*) 0.3 - 1.2 mg/dL    GFR calc non Af Amer 8 (*) >90 mL/min    GFR calc Af Amer 9 (*) >90 mL/min   CBC WITH DIFFERENTIAL     Status: Abnormal   Collection Time   01/17/12  5:28 AM      Component Value Range Comment   WBC 10.5  4.0 - 10.5 K/uL    RBC 3.14 (*) 3.87 - 5.11 MIL/uL    Hemoglobin 9.3 (*) 12.0 - 15.0 g/dL    HCT 29.0 (*) 36.0 - 46.0 %    MCV 92.4  78.0 - 100.0 fL    MCH 29.6  26.0 - 34.0 pg    MCHC 32.1  30.0 - 36.0 g/dL    RDW 15.8 (*) 11.5 - 15.5 %    Platelets 377  150 - 400 K/uL    Neutrophils Relative 74  43 -  77 %    Neutro Abs 7.8 (*) 1.7 - 7.7 K/uL    Lymphocytes Relative 9 (*) 12 - 46 %    Lymphs Abs 1.0  0.7 - 4.0 K/uL    Monocytes Relative 12  3 - 12 %    Monocytes Absolute 1.3 (*) 0.1 - 1.0 K/uL    Eosinophils Relative 4  0 - 5 %    Eosinophils Absolute 0.5  0.0 - 0.7 K/uL    Basophils Relative 1  0 - 1 %    Basophils Absolute 0.1  0.0 - 0.1 K/uL   PHOSPHORUS     Status: Normal   Collection Time   01/17/12  5:28 AM      Component Value Range Comment   Phosphorus 3.5  2.3 - 4.6 mg/dL     Ct Chest Wo Contrast  01/16/2012  *RADIOLOGY REPORT*  Clinical Data: Persistent chest congestion and difficulty breathing.  Hemoptysis.  CT CHEST WITHOUT CONTRAST  Technique:  Multidetector CT imaging of the chest was performed following the standard protocol without IV contrast.  Comparison: Chest radiograph 01/15/2012.  Findings: No pathologically enlarged mediastinal or axillary lymph nodes.  Hilar regions are difficult to definitively evaluate without IV contrast.  Extensive coronary artery calcification. Heart is mildly enlarged.  No pericardial effusion. Difficult to exclude eccentric thickening of the upper esophagus (image 13). Portions of the esophagus are air filled and dilated.  Mild air space disease is seen dependently in both lower lobes.  No pleural fluid.  Incidental imaging of the upper abdomen shows low attenuation lesions in the upper pole right kidney, as on 11/02/2010.  Kidneys are atrophic.  Retained oral contrast in the splenic flexure of the colon.  No worrisome lytic or sclerotic lesions.  IMPRESSION:  1.  Minimal airspace disease in both lower lobes, possibly due to resolving pneumonia and/or atelectasis. 2.  Difficult to exclude eccentric thickening of the upper esophagus.  If clinically warranted, an esophagram could be performed in further evaluation. 3.  Extensive coronary artery calcification.  Original Report Authenticated By: Luretha Rued, M.D.   Dg Chest Port 1 View  01/15/2012   *RADIOLOGY REPORT*  Clinical Data: Short of breath  PORTABLE CHEST - 1 VIEW  Comparison: 1400 hours  Findings: Mild cardiomegaly.  Improved bibasilar atelectasis. Upper lungs are clear.  No pneumothorax.  IMPRESSION: Improved bibasilar atelectasis.  Original  Report Authenticated By: Jamas Lav, M.D.    ROS:per medical record   Blood pressure 168/68, pulse 85, temperature 98.7 F (37.1 C), temperature source Oral, resp. rate 18, height 5\' 2"  (1.575 m), weight 59.8 kg (131 lb 13.4 oz), SpO2 99.00%.  PHYSICAL EXAM: General appearance - alert, well appearing, and in no distress and oriented to person, place, and time Nose - normal and patent, no erythema, discharge or polyps, mucosal erythema, clear rhinorrhea and no bleeding Mouth - mucous membranes moist, pharynx normal without lesions, tonsils normal and tongue normal Neck - supple, no significant adenopathy Chest - clear to auscultation, mild exp wheeze, rales or rhonchi, symmetric air entry  Flex Laryngoscopy: Nl NP,OP, supraglottis. VC's mobile, no lesion, no bleeding. hvy post-glottic edema.     Studies Reviewed:CXR and Chest CT  Assessment/Plan: No lesion on laryngoscopy, no active bleeding. Glottic edema from intubation. Monitor and expect cont. improvement. Cover for GERD. Cont po as tolerated.     Tamsyn Owusu 01/17/2012, 4:03 PM      Increased PPI to protonix 40 mg po bid

## 2012-01-17 NOTE — Progress Notes (Signed)
CARDIAC REHAB PHASE I   PRE:  Rate/Rhythm: 84 SR    BP: sitting 172/50    SaO2: 96 RA  MODE:  Ambulation: 270 ft   POST:  Rate/Rhythm: 111 ST    BP: sitting 168/61     SaO2: 99 RA  Much stronger today. No coughing/bloody phlegm. Fairly steady with RW and assist x2. To recliner. Heard, [place], ACSM

## 2012-01-18 ENCOUNTER — Inpatient Hospital Stay (HOSPITAL_COMMUNITY): Payer: Medicare Other

## 2012-01-18 LAB — COMPREHENSIVE METABOLIC PANEL
ALT: 16 U/L (ref 0–35)
AST: 18 U/L (ref 0–37)
Albumin: 3.2 g/dL — ABNORMAL LOW (ref 3.5–5.2)
Alkaline Phosphatase: 58 U/L (ref 39–117)
Calcium: 9.8 mg/dL (ref 8.4–10.5)
GFR calc Af Amer: 5 mL/min — ABNORMAL LOW (ref >90)
Glucose, Bld: 176 mg/dL — ABNORMAL HIGH (ref 70–99)
Potassium: 4 mEq/L (ref 3.5–5.1)
Sodium: 136 mEq/L (ref 135–145)
Total Protein: 6.5 g/dL (ref 6.0–8.3)

## 2012-01-18 LAB — BASIC METABOLIC PANEL
CO2: 27 mEq/L (ref 19–32)
Calcium: 10.1 mg/dL (ref 8.4–10.5)
Chloride: 93 mEq/L — ABNORMAL LOW (ref 96–112)
Glucose, Bld: 120 mg/dL — ABNORMAL HIGH (ref 70–99)
Sodium: 138 mEq/L (ref 135–145)

## 2012-01-18 MED ORDER — CLOTRIMAZOLE 10 MG MT TROC
10.0000 mg | Freq: Every day | OROMUCOSAL | Status: DC
  Filled 2012-01-18 (×5): qty 1

## 2012-01-18 MED ORDER — CLONIDINE HCL 0.1 MG PO TABS
0.1000 mg | ORAL_TABLET | Freq: Two times a day (BID) | ORAL | Status: DC
  Administered 2012-01-18 (×2): 0.1 mg via ORAL
  Filled 2012-01-18 (×4): qty 1

## 2012-01-18 MED ORDER — AMLODIPINE BESYLATE 10 MG PO TABS
10.0000 mg | ORAL_TABLET | Freq: Every day | ORAL | Status: DC
  Administered 2012-01-18 – 2012-01-24 (×7): 10 mg via ORAL
  Filled 2012-01-18 (×9): qty 1

## 2012-01-18 MED ORDER — POLYETHYLENE GLYCOL 3350 17 G PO PACK
17.0000 g | PACK | Freq: Every day | ORAL | Status: DC
  Administered 2012-01-18 – 2012-01-25 (×7): 17 g via ORAL
  Filled 2012-01-18 (×8): qty 1

## 2012-01-18 NOTE — Progress Notes (Signed)
Subjective:  No further hemoptysis.  No chest pain.  Rhythm stable NSR. Was seen by ENT yesterday. They recommended increase in Protonix Objective:  Vital Signs in the last 24 hours: Temp:  [98.5 F (36.9 C)-99 F (37.2 C)] 98.5 F (36.9 C) (07/10 0349) Pulse Rate:  [84-107] 84  (07/10 0352) Resp:  [18] 18  (07/09 1615) BP: (168-217)/(64-97) 175/70 mmHg (07/10 0655) SpO2:  [99 %-100 %] 100 % (07/10 0352)  Intake/Output from previous day: 07/09 0701 - 07/10 0700 In: 480 [P.O.:480] Out: -  Intake/Output from this shift:       . allopurinol  300 mg Oral Daily  . amLODipine  5 mg Oral QHS  . aspirin  81 mg Oral Daily  . atorvastatin  20 mg Oral q1800  . darbepoetin (ARANESP) injection - DIALYSIS  60 mcg Intravenous Q Mon-HD  . ferric gluconate (FERRLECIT/NULECIT) IV  125 mg Intravenous Q M,W,F-HD  . fluticasone  2 spray Each Nare Daily  . guaiFENesin  600 mg Oral BID  . heparin  40 Units/kg Dialysis Once in dialysis  . lanthanum  1,000 mg Oral TID WC  . loratadine  10 mg Oral Daily  . metoprolol tartrate  12.5 mg Oral BID  . multivitamin  1 tablet Oral QHS  . pantoprazole  40 mg Oral BID AC  . sodium chloride  3 mL Intravenous Q12H  . Ticagrelor  90 mg Oral BID  . DISCONTD: pantoprazole  20 mg Oral Q1200      . sodium chloride 10 mL/hr at 01/11/12 2130  . dextrose 5 % and 0.45% NaCl Stopped (01/13/12 1000)    Physical Exam: The patient appears to be in no distress.  Head and neck exam reveals that the pupils are equal and reactive.  The extraocular movements are full.  There is no scleral icterus.  Mouth and pharynx are benign.  No lymphadenopathy.  No carotid bruits.  The jugular venous pressure is normal.  Thyroid is not enlarged or tender. No active bleeding seen from mouth.  Chest reveals scattered rhonchi, minimal.  Heart reveals no abnormal lift or heave.  First and second heart sounds are normal.  There is no murmur gallop rub or click.  The abdomen is  soft and nontender.  Bowel sounds are normoactive.  There is no hepatosplenomegaly or mass.  There are no abdominal bruits.  Extremities reveal no phlebitis or edema.  Pedal pulses are good.  There is no cyanosis or clubbing.  Neurologic exam is normal strength and no lateralizing weakness.  No sensory deficits.  Integument reveals no rash  Lab Results:  Basename 01/17/12 0528 01/16/12 0440  WBC 10.5 10.4  HGB 9.3* 10.2*  PLT 377 335    Basename 01/18/12 0528 01/17/12 0528  NA 138 139  K 4.0 3.9  CL 93* 95*  CO2 27 28  GLUCOSE 120* 119*  BUN 66* 33*  CREATININE 8.42* 5.13*   No results found for this basename: TROPONINI:2,CK,MB:2 in the last 72 hours Hepatic Function Panel  Basename 01/17/12 0528  PROT 6.7  ALBUMIN 3.3*  AST 19  ALT 17  ALKPHOS 59  BILITOT 0.2*  BILIDIR --  IBILI --   No results found for this basename: CHOL in the last 72 hours No results found for this basename: PROTIME in the last 72 hours  Imaging: Ct Chest Wo Contrast  01/16/2012  *RADIOLOGY REPORT*  Clinical Data: Persistent chest congestion and difficulty breathing.  Hemoptysis.  CT CHEST WITHOUT  CONTRAST  Technique:  Multidetector CT imaging of the chest was performed following the standard protocol without IV contrast.  Comparison: Chest radiograph 01/15/2012.  Findings: No pathologically enlarged mediastinal or axillary lymph nodes.  Hilar regions are difficult to definitively evaluate without IV contrast.  Extensive coronary artery calcification. Heart is mildly enlarged.  No pericardial effusion. Difficult to exclude eccentric thickening of the upper esophagus (image 13). Portions of the esophagus are air filled and dilated.  Mild air space disease is seen dependently in both lower lobes.  No pleural fluid.  Incidental imaging of the upper abdomen shows low attenuation lesions in the upper pole right kidney, as on 11/02/2010.  Kidneys are atrophic.  Retained oral contrast in the splenic flexure  of the colon.  No worrisome lytic or sclerotic lesions.  IMPRESSION:  1.  Minimal airspace disease in both lower lobes, possibly due to resolving pneumonia and/or atelectasis. 2.  Difficult to exclude eccentric thickening of the upper esophagus.  If clinically warranted, an esophagram could be performed in further evaluation. 3.  Extensive coronary artery calcification.  Original Report Authenticated By: Luretha Rued, M.D.    Cardiac Studies: Telemetry NSR Assessment/Plan:  Patient Active Hospital Problem List: CAD (coronary artery disease) (12/16/2011)   Assessment: On ASA and Brilinta for recent DES.   Plan: Continue same meds. HTN (hypertension) (12/16/2011)   Assessment: Systolic high prior to dialysis today.   Plan: Continue current meds.  ESRD (end stage renal disease) (12/16/2011)   Assessment: Stable   Plan: Continue HD. She is asking what kind of food she is allowed to eat at home. Will ask dietitian to see prior to discharge. Hemoptysis    Resolved.  Plan: Discharge home today after HD, follow up in [facility] clinic in 1-2 weeks.  Case manager working with husband to be sure she has the [employer].  LOS: 9 days    Darlin Coco 01/18/2012, 8:24 AM

## 2012-01-18 NOTE — Progress Notes (Signed)
Nutrition Brief Note:  RD consulted for renal diet education. Pt off the unit at time of RD visit. Spoke with RN who states pt likely non-compliant with diet and needs reinforcement of appropriate meal choices.  RD will attempt to see pt for education again tomorrow.   Orson Slick RD, LDN Pager [phone] After Hours pager [phone]

## 2012-01-18 NOTE — Progress Notes (Signed)
Subjective: Interval History: has complaints constip, also ST and swelling.  Objective: Vital signs in last 24 hours: Temp:  [98.5 F (36.9 C)-99 F (37.2 C)] 98.5 F (36.9 C) (07/10 0349) Pulse Rate:  [78-107] 78  (07/10 0750) Resp:  [18] 18  (07/09 1615) BP: (168-217)/(64-97) 184/75 mmHg (07/10 0750) SpO2:  [99 %-100 %] 100 % (07/10 0750) Weight change:   Intake/Output from previous day: 07/09 0701 - 07/10 0700 In: 480 [P.O.:480] Out: -  Intake/Output this shift:    General appearance: alert, cooperative and appears stated age Throat: white film on tongue and throat Resp: clear to auscultation bilaterally Cardio: S1, S2 normal and systolic murmur: systolic ejection 2/6, decrescendo at 2nd left intercostal space GI: liver down 3 cm pos bs , soft Extremities: AVF LUA B&T  Lab Results:  [facility] 01/17/12 0528 01/16/12 0440  WBC 10.5 10.4  HGB 9.3* 10.2*  HCT 29.0* 31.9*  PLT 377 335   BMET:  Basename 01/18/12 0528 01/17/12 0528  NA 138 139  K 4.0 3.9  CL 93* 95*  CO2 27 28  GLUCOSE 120* 119*  BUN 66* 33*  CREATININE 8.42* 5.13*  CALCIUM 10.1 9.9   No results found for this basename: PTH:2 in the last 72 hours Iron Studies:  Basename 01/16/12 1330  IRON 44  TIBC 252  TRANSFERRIN --  FERRITIN --    Studies/Results: Ct Chest Wo Contrast  01/16/2012  *RADIOLOGY REPORT*  Clinical Data: Persistent chest congestion and difficulty breathing.  Hemoptysis.  CT CHEST WITHOUT CONTRAST  Technique:  Multidetector CT imaging of the chest was performed following the standard protocol without IV contrast.  Comparison: Chest radiograph 01/15/2012.  Findings: No pathologically enlarged mediastinal or axillary lymph nodes.  Hilar regions are difficult to definitively evaluate without IV contrast.  Extensive coronary artery calcification. Heart is mildly enlarged.  No pericardial effusion. Difficult to exclude eccentric thickening of the upper esophagus (image 13). Portions of the  esophagus are air filled and dilated.  Mild air space disease is seen dependently in both lower lobes.  No pleural fluid.  Incidental imaging of the upper abdomen shows low attenuation lesions in the upper pole right kidney, as on 11/02/2010.  Kidneys are atrophic.  Retained oral contrast in the splenic flexure of the colon.  No worrisome lytic or sclerotic lesions.  IMPRESSION:  1.  Minimal airspace disease in both lower lobes, possibly due to resolving pneumonia and/or atelectasis. 2.  Difficult to exclude eccentric thickening of the upper esophagus.  If clinically warranted, an esophagram could be performed in further evaluation. 3.  Extensive coronary artery calcification.  Original Report Authenticated By: Luretha Rued, M.D.    I have reviewed the patient's current medications.  Assessment/Plan: 1 CRF for HD 2 HTN ^ Norvasc, lower vol 3 Anemia epo/fe 4 HPTH 5 Constip give Miralx 6 Thrush give Mycelex  P HD, Norvasc, Miralax, Mycelex    LOS: 9 days   Nykeria Mealing L 01/18/2012,9:05 AM

## 2012-01-18 NOTE — Procedures (Signed)
I was present at this session.  I have reviewed the session itself and made appropriate changes.  Hayzel Ruberg L 7/10/201310:46 AM

## 2012-01-18 NOTE — Progress Notes (Addendum)
Dr. Jimmy Footman informed me that pt had noted blood on the underpad this morning. I went up and spoke with patient and dialysis nurse and this was confirmed. There was an area of red-tinged (small plate sized) fluid on the underpad, but it also smelled strongly of urine (she is on HD but still makes urine). No overt clots or hemorrhage. Patient is understandably nervous about this, but was not aware of the bleeding until she moved. Unclear if rectal, vaginal, urinary source. D/w Dr. Mare Ferrari. In light of this development as well as elevated BP/new med changes today, will keep another day. Check CBC in AM given drop today, will add stool guiaic and UA. Patient also informed to notify nurse if she has stool to send off. If she does not have a BM, would proceed with rectal (deferred while she was getting hooked up to dialysis). Will monitor closely. Shawna Bracken PA-C

## 2012-01-19 DIAGNOSIS — D62 Acute posthemorrhagic anemia: Secondary | ICD-10-CM

## 2012-01-19 DIAGNOSIS — K922 Gastrointestinal hemorrhage, unspecified: Secondary | ICD-10-CM

## 2012-01-19 LAB — BASIC METABOLIC PANEL
BUN: 32 mg/dL — ABNORMAL HIGH (ref 6–23)
CO2: 27 mEq/L (ref 19–32)
Chloride: 94 mEq/L — ABNORMAL LOW (ref 96–112)
Creatinine, Ser: 4.74 mg/dL — ABNORMAL HIGH (ref 0.50–1.10)
Glucose, Bld: 111 mg/dL — ABNORMAL HIGH (ref 70–99)
Potassium: 3.9 mEq/L (ref 3.5–5.1)

## 2012-01-19 LAB — CBC
HCT: 25.1 % — ABNORMAL LOW (ref 36.0–46.0)
Hemoglobin: 8.1 g/dL — ABNORMAL LOW (ref 12.0–15.0)
MCH: 29.5 pg (ref 26.0–34.0)
MCHC: 32.3 g/dL (ref 30.0–36.0)
MCV: 91.3 fL (ref 78.0–100.0)
RDW: 15.7 % — ABNORMAL HIGH (ref 11.5–15.5)

## 2012-01-19 MED ORDER — HEPARIN SODIUM (PORCINE) 1000 UNIT/ML DIALYSIS: 1000.0000 [IU] | INTRAMUSCULAR | Status: DC | PRN

## 2012-01-19 MED ORDER — SODIUM CHLORIDE 0.9 % IV SOLN: 100.0000 mL | INTRAVENOUS | Status: DC | PRN

## 2012-01-19 MED ORDER — HEPARIN SODIUM (PORCINE) 1000 UNIT/ML DIALYSIS
40.0000 [IU]/kg | Freq: Once | INTRAMUSCULAR | Status: DC
  Filled 2012-01-19: qty 3

## 2012-01-19 MED ORDER — SORBITOL 70 % SOLN
60.0000 mL | Status: AC
  Administered 2012-01-19 (×2): 60 mL via ORAL
  Filled 2012-01-19 (×4): qty 60

## 2012-01-19 MED ORDER — DARBEPOETIN ALFA-POLYSORBATE 150 MCG/0.3ML IJ SOLN
150.0000 ug | INTRAMUSCULAR | Status: DC
  Administered 2012-01-23: 150 ug via INTRAVENOUS
  Filled 2012-01-19: qty 0.3

## 2012-01-19 MED ORDER — ALTEPLASE 2 MG IJ SOLR
2.0000 mg | Freq: Once | INTRAMUSCULAR | Status: AC | PRN
  Filled 2012-01-19: qty 2

## 2012-01-19 MED ORDER — LIDOCAINE HCL (PF) 1 % IJ SOLN: 5.0000 mL | INTRAMUSCULAR | Status: DC | PRN

## 2012-01-19 MED ORDER — LIDOCAINE-PRILOCAINE 2.5-2.5 % EX CREA
1.0000 "application " | TOPICAL_CREAM | CUTANEOUS | Status: DC | PRN
  Filled 2012-01-19: qty 5

## 2012-01-19 MED ORDER — SODIUM CHLORIDE 0.9 % IV SOLN
8.0000 mg/h | INTRAVENOUS | Status: DC
  Administered 2012-01-19 – 2012-01-22 (×5): 8 mg/h via INTRAVENOUS
  Filled 2012-01-19 (×14): qty 80

## 2012-01-19 MED ORDER — PENTAFLUOROPROP-TETRAFLUOROETH EX AERO: 1.0000 "application " | INHALATION_SPRAY | CUTANEOUS | Status: DC | PRN

## 2012-01-19 MED ORDER — NEPRO/CARBSTEADY PO LIQD
237.0000 mL | ORAL | Status: DC | PRN
  Filled 2012-01-19: qty 237

## 2012-01-19 MED ORDER — HEPARIN SODIUM (PORCINE) 1000 UNIT/ML DIALYSIS: 40.0000 [IU]/kg | Freq: Once | INTRAMUSCULAR | Status: DC

## 2012-01-19 NOTE — Progress Notes (Signed)
Subjective:  States she just does not feel well. States she has not passed any urine or had any bowel movement since admission.  Still has a dry cough worse at night despite mucinex.   Yesterday was noted to have blood on sheet under perineum while in HD. No further bleeding but Hgb has dropped. States she had colonoscopy in [place] in January [address]013 and two polyps were removed and another was left. Recent CT of chest raised question of possible esophageal eccentric thickening. BP better since restarting clonidine. Objective:  Vital Signs in the last 24 hours: Temp:  [97.6 F (36.4 C)-99.3 F (37.4 C)] 98.2 F (36.8 C) (07/11 0754) Pulse Rate:  [66-105] 104  (07/11 0754) Resp:  [13-22] 16  (07/11 0000) BP: (91-196)/(42-85) 152/66 mmHg (07/11 0754) SpO2:  [96 %-99 %] 97 % (07/11 0754) Weight:  [127 lb 10.3 oz (57.9 kg)-130 lb 15.3 oz (59.4 kg)] 127 lb 10.3 oz (57.9 kg) (07/11 0600)  Intake/Output from previous day: 07/10 0701 - 07/11 0700 In: 480 [P.O.:480] Out: 547  Intake/Output from this shift:       . allopurinol  300 mg Oral Daily  . amLODipine  10 mg Oral QHS  . aspirin  81 mg Oral Daily  . atorvastatin  20 mg Oral q1800  . cloNIDine  0.1 mg Oral BID  . darbepoetin (ARANESP) injection - DIALYSIS  60 mcg Intravenous Q Mon-HD  . ferric gluconate (FERRLECIT/NULECIT) IV  125 mg Intravenous Q M,W,F-HD  . fluticasone  2 spray Each Nare Daily  . guaiFENesin  600 mg Oral BID  . heparin  40 Units/kg Dialysis Once in dialysis  . lanthanum  1,000 mg Oral TID WC  . loratadine  10 mg Oral Daily  . metoprolol tartrate  12.5 mg Oral BID  . multivitamin  1 tablet Oral QHS  . pantoprazole  40 mg Oral BID AC  . polyethylene glycol  17 g Oral Daily  . sodium chloride  3 mL Intravenous Q12H  . Ticagrelor  90 mg Oral BID  . DISCONTD: amLODipine  5 mg Oral QHS  . DISCONTD: clotrimazole  10 mg Oral 5 X Daily      . DISCONTD: sodium chloride 10 mL/hr at 01/11/12 2130  . DISCONTD:  dextrose 5 % and 0.45% NaCl Stopped (01/13/12 1000)    Physical Exam: The patient appears to be in no distress.  Head and neck exam reveals that the pupils are equal and reactive.  The extraocular movements are full.  There is no scleral icterus.  Mouth and pharynx are benign.  No lymphadenopathy.  No carotid bruits.  The jugular venous pressure is normal.  Thyroid is not enlarged or tender.  Chest is clear to percussion and auscultation.  No rales or rhonchi.  Expansion of the chest is symmetrical.  Heart reveals no abnormal lift or heave.  First and second heart sounds are normal.  There is no murmur gallop rub or click.  The abdomen is soft and nontender.  Bowel sounds are normoactive.  There is no hepatosplenomegaly or mass.  There are no abdominal bruits.  Extremities: no edema  Neurologic exam is normal strength and no lateralizing weakness.  No sensory deficits.  Integument reveals no rash  Lab Results:  Basename 01/19/12 0620 01/17/12 0528  WBC 12.7* 10.5  HGB 8.1* 9.3*  PLT 380 377    Basename 01/19/12 0620 01/18/12 1140  NA 137 136  K 3.9 4.0  CL 94* 92*  CO2  27 26  GLUCOSE 111* 176*  BUN 32* 71*  CREATININE 4.74* 8.98*   No results found for this basename: TROPONINI:2,CK,MB:2 in the last 72 hours Hepatic Function Panel  Basename 01/18/12 1140  PROT 6.5  ALBUMIN 3.2*  AST 18  ALT 16  ALKPHOS 58  BILITOT 0.2*  BILIDIR --  IBILI --   No results found for this basename: CHOL in the last 72 hours No results found for this basename: PROTIME in the last 72 hours  Imaging: Imaging results have been reviewed  Cardiac Studies:  Assessment/Plan:  Patient Active Hospital Problem List: CAD (coronary artery disease) (12/16/2011)   Assessment: Recent DES   Plan: Continue ASA, Brilinta HTN (hypertension) (12/16/2011)   Assessment:Improved on clonidine   Plan: Same Rx  Acute systolic CHF (congestive heart failure) (01/09/2012)   Assessment: Resolved    Plan:  Continue metoprolol, hemodialysis Possible hematochezia   Assessment: No BM in 1 week. No response to miralax last night.   Plan: ask GI to see re hematochezia and also esophageal abnormality on CT scan. Follow daily CBC Discharge is delayed until resolution of these new problems   LOS: 10 days    Darlin Coco 01/19/2012, 8:23 AM

## 2012-01-19 NOTE — Progress Notes (Signed)
Subjective: Interval History: has complaints constip.  Objective: Vital signs in last 24 hours: Temp:  [97.6 F (36.4 C)-99.3 F (37.4 C)] 98.2 F (36.8 C) (07/11 0754) Pulse Rate:  [66-105] 104  (07/11 0754) Resp:  [13-22] 16  (07/11 0000) BP: (91-196)/(42-85) 152/66 mmHg (07/11 0754) SpO2:  [96 %-99 %] 97 % (07/11 0754) Weight:  [57.9 kg (127 lb 10.3 oz)-59.4 kg (130 lb 15.3 oz)] 57.9 kg (127 lb 10.3 oz) (07/11 0600) Weight change:   Intake/Output from previous day: 07/10 0701 - 07/11 0700 In: 480 [P.O.:480] Out: 547  Intake/Output this shift: Total I/O In: 240 [P.O.:240] Out: -   General appearance: alert, cooperative and appears stated age Resp: clear to auscultation bilaterally Cardio: S1, S2 normal and systolic murmur: systolic ejection 2/6, decrescendo at 2nd left intercostal space GI: mild distension, pos bs Extremities: AVF LUA B&T  Lab Results:  [facility] 01/19/12 0620 01/17/12 0528  WBC 12.7* 10.5  HGB 8.1* 9.3*  HCT 25.1* 29.0*  PLT 380 377   BMET:  Basename 01/19/12 0620 01/18/12 1140  NA 137 136  K 3.9 4.0  CL 94* 92*  CO2 27 26  GLUCOSE 111* 176*  BUN 32* 71*  CREATININE 4.74* 8.98*  CALCIUM 9.2 9.8   No results found for this basename: PTH:2 in the last 72 hours Iron Studies:  Basename 01/16/12 1330  IRON 44  TIBC 252  TRANSFERRIN --  FERRITIN --    Studies/Results: No results found.  I have reviewed the patient's current medications.  Assessment/Plan: 1 CRF vol better 2 HTN better with lower vol and longer UF 3 Anemia on fe/epo lower HB with GI losses 4 Constip give sorbitol 5 CAD 6 HPTH  p sorbitol, HD    LOS: 10 days   Kayvan Hoefling L 01/19/2012,10:13 AM

## 2012-01-19 NOTE — Plan of Care (Signed)
Problem: Limited Adherence to Nutrition-Related Recommendations (NB-1.6) Goal: Nutrition education Formal process to instruct or train a patient/client in a skill or to impart knowledge to help patients/clients voluntarily manage or modify food choices and eating behavior to maintain or improve health.  Outcome: Completed/Met Date Met:  01/19/12 Nutrition Education Note  RD consulted for Renal Education. Provided Renal pyramid to patient/family. Reviewed food groups and provided written recommended serving sizes specifically determined for patient's current nutritional status.   Explained why diet restrictions are needed and provided lists of foods to limit/avoid that are high potassium, sodium, and phosphorus. Provided specific recommendations on safer alternatives of these foods. Strongly encouraged compliance of this diet.    Pt reports that she has been on HD for 2 years and has concerns about what she should be eating. Has had diet education in the past but needed more information. Encouraged pt to discuss specific diet questions/concerns with RD at HD outpatient facility.   Expect fair compliance.  Body mass index is 23.35 kg/(m^2). Pt meets criteria for WNL based on current BMI.  RD will continue to follow pt through this admission.    Orson Slick RD, LDN Pager [phone] After Hours pager [phone]

## 2012-01-19 NOTE — Consult Note (Signed)
[facility] Consultation  Referring Provider: [facility] Primary Care Physician:  [name], MD Primary Gastroenterologist:   ? Name but in [place] Reason for Consultation:  Possible GI bleed  HPI: Shawna [name] is a 71 y.o. female with multiple significant medical problems including ESRD on HD. Patient had a NSTEMI in June. She had cardiac stenting with DES,was started on Plavix and ASA but developed rash on Plavix. She was changed to Brilinta but never got prescription filled.Patient later developed chest pain and pulmonary edema requiring intubation.  She had repeat cath showing continued patency of the stents.She is now on Brilinta.  Patient developed some hemoptysis this admission, CXR negative for infiltrates. ENT evaluated her. No lesions of laryngoscopy but glottic edema from intubation. Hemoptysis resolved, patient was nearing discharge but blood noted on underpad in dialysis today. According to notes, fluid smelled of urine. No abdominal pain, no nausea. Patient does complains of constipation, no BM in one week. She has colon cancer listed in PMH, patient denies this. Patient had a colonoscopy in January in [place]. She apparently had two polyps removed. She also gives a history of GERD and a bleeding ulcer last year.    Past Medical History  Diagnosis Date  . ESRD (end stage renal disease) on dialysis 12/15/11    [place]; Mon; Wed; Fri  . Hypertension   . CHF (congestive heart failure)   . Anginal pain   . Chronic bronchitis 12/15/11    "have had it last 4 wk"  . Pneumonia ~ 2010  . Anemia   . History of blood transfusion 07/2011    "first time"  . GERD (gastroesophageal reflux disease)   . History of stomach ulcers   . History of lower GI bleeding   . Arthritis     "hands and feet"  . History of gout   . Anxiety   . Ovarian cancer 1992      . CAD (coronary artery disease)     a. 12/2011 s/p DES to LCX and Diag    Past Surgical History  Procedure Date  .  Coronary angioplasty with stent placement 12/15/11    "2"  . Abdominal hysterectomy 1992  . Appendectomy 06/1990  . Tubal ligation 1980's  . Av fistula placement 07/2009    left upper arm  . Thrombectomy / arteriovenous graft revision     left upper arm  . Colon resection 1992    Prior to Admission medications   Medication Sig Start Date End Date Taking? Authorizing Provider  allopurinol (ZYLOPRIM) 300 MG tablet Take 300 mg by mouth daily.   Yes Historical Provider, MD  ALPRAZolam Duanne Moron) 0.25 MG tablet Take 0.25 mg by mouth at bedtime as needed. For anxiety.   Yes Historical Provider, MD  amLODipine (NORVASC) 10 MG tablet Take 10 mg by mouth daily.   Yes Historical Provider, MD  aspirin EC 81 MG tablet Take 81 mg by mouth daily.   Yes Historical Provider, MD  atorvastatin (LIPITOR) 20 MG tablet Take 20 mg by mouth daily.   Yes Historical Provider, MD  cloNIDine (CATAPRES) 0.2 MG tablet Take 0.2 mg by mouth 2 (two) times daily.   Yes Historical Provider, MD  fluconazole (DIFLUCAN) 100 MG tablet Take 100 mg by mouth daily.   Yes Historical Provider, MD  folic acid-vitamin b complex-vitamin c-selenium-zinc (DIALYVITE) 3 MG TABS Take 1 tablet by mouth daily.   Yes Historical Provider, MD  iron polysaccharides (NIFEREX) 150 MG capsule Take 150 mg by  mouth 2 (two) times daily.   Yes Historical Provider, MD  labetalol (NORMODYNE) 200 MG tablet Take 200 mg by mouth 2 (two) times daily. 12/16/11  Yes Rogelia Mire, NP  lanthanum (FOSRENOL) 500 MG chewable tablet Chew 1,000 mg by mouth 3 (three) times daily with meals.   Yes Historical Provider, MD  loratadine (CLARITIN) 10 MG tablet Take 10 mg by mouth daily as needed. For allergies.   Yes Historical Provider, MD  nitroGLYCERIN (NITROSTAT) 0.4 MG SL tablet Place 0.4 mg under the tongue every 5 (five) minutes as needed. For chest pain.   Yes Historical Provider, MD  pantoprazole (PROTONIX) 20 MG tablet Take 20 mg by mouth daily.   Yes Historical  Provider, MD  Ticagrelor (BRILINTA) 90 MG TABS tablet Take 90 mg by mouth 2 (two) times daily.   Yes Historical Provider, MD    Current Facility-Administered Medications  Medication Dose Route Frequency Provider Last Rate Last Dose  . 0.9 %  sodium chloride infusion  250 mL Intravenous PRN Roger A Arguello, PA-C      . 0.9 %  sodium chloride infusion  100 mL Intravenous PRN Joyice Faster Deterding, MD      . 0.9 %  sodium chloride infusion  100 mL Intravenous PRN Joyice Faster Deterding, MD      . 0.9 %  sodium chloride infusion  100 mL Intravenous PRN Joyice Faster Deterding, MD      . 0.9 %  sodium chloride infusion  100 mL Intravenous PRN Placido Sou, MD      . acetaminophen (TYLENOL) tablet 650 mg  650 mg Oral Q4H PRN Meriel Pica, PA-C   650 mg at 01/15/12 1905  . albuterol (PROVENTIL) (5 MG/ML) 0.5% nebulizer solution 2.5 mg  2.5 mg Nebulization Q2H PRN Darlin Coco, MD   2.5 mg at 01/15/12 2330  . allopurinol (ZYLOPRIM) tablet 300 mg  300 mg Oral Daily Roger A Arguello, PA-C   300 mg at 01/19/12 1044  . ALPRAZolam Duanne Moron) tablet 0.25 mg  0.25 mg Oral QHS PRN Meriel Pica, PA-C   0.25 mg at 01/18/12 2131  . alteplase (CATHFLO ACTIVASE) injection 2 mg  2 mg Intracatheter Once PRN Placido Sou, MD      . amLODipine (NORVASC) tablet 10 mg  10 mg Oral QHS Placido Sou, MD   10 mg at 01/18/12 2130  . aspirin chewable tablet 81 mg  81 mg Oral Daily Darlin Coco, MD   81 mg at 01/19/12 1044  . atorvastatin (LIPITOR) tablet 20 mg  20 mg Oral q1800 Roger A Arguello, PA-C   20 mg at 01/18/12 1804  . camphor-menthol (SARNA) lotion   Topical PRN Colbert Coyer, MD      . darbepoetin [facility]) injection 150 mcg  150 mcg Intravenous Q Mon-HD Placido Sou, MD      . diphenhydrAMINE (BENADRYL) capsule 25 mg  25 mg Oral Q8H PRN Dayna N Dunn, PA   25 mg at 01/18/12 1551  . feeding supplement (NEPRO CARB STEADY) liquid 237 mL  237 mL Oral PRN Placido Sou, MD      . ferric  gluconate (NULECIT) 125 mg in sodium chloride 0.9 % 100 mL IVPB  125 mg Intravenous Q M,W,F-HD Placido Sou, MD      . fluticasone (FLONASE) 50 MCG/ACT nasal spray 2 spray  2 spray Each Nare Daily Darlin Coco, MD   2 spray at 01/18/12 [id number]  .  guaiFENesin (MUCINEX) 12 hr tablet 600 mg  600 mg Oral BID Darlin Coco, MD   600 mg at 01/19/12 1044  . guaiFENesin (ROBITUSSIN) 100 MG/5ML solution 200 mg  200 mg Oral Q4H PRN Rogelia Mire, NP   200 mg at 01/17/12 1634  . heparin injection 2,300 Units  40 Units/kg Dialysis Once in dialysis Joyice Faster Deterding, MD      . labetalol (NORMODYNE,TRANDATE) injection 10 mg  10 mg Intravenous Q2H PRN Waldemar Dickens, MD   10 mg at 01/18/12 0441  . lanthanum (FOSRENOL) chewable tablet 1,000 mg  1,000 mg Oral TID WC Roger A Arguello, PA-C   1,000 mg at 01/19/12 1231  . lidocaine (XYLOCAINE) 1 % injection 5 mL  5 mL Intradermal PRN Placido Sou, MD      . lidocaine-prilocaine (EMLA) cream 1 application  1 application Topical PRN Placido Sou, MD      . loratadine (CLARITIN) tablet 10 mg  10 mg Oral Daily Deboraha Sprang, MD   10 mg at 01/19/12 1044  . menthol-cetylpyridinium (CEPACOL) lozenge 3 mg  1 lozenge Oral PRN Darlin Coco, MD   3 mg at 01/17/12 0820  . metoprolol tartrate (LOPRESSOR) tablet 12.5 mg  12.5 mg Oral BID Lelon Perla, MD   12.5 mg at 01/19/12 1044  . multivitamin (RENA-VIT) tablet 1 tablet  1 tablet Oral QHS Darlin Coco, MD   1 tablet at 01/18/12 2130  . nitroGLYCERIN (NITROSTAT) SL tablet 0.4 mg  0.4 mg Sublingual Q5 min PRN Meriel Pica, PA-C   0.4 mg at 01/15/12 2115  . ondansetron (ZOFRAN) injection 4 mg  4 mg Intravenous Q6H PRN Roger A Arguello, PA-C   4 mg at 01/11/12 1630  . pantoprazole (PROTONIX) EC tablet 40 mg  40 mg Oral BID AC Jerrell Belfast, MD   40 mg at 01/19/12 0756  . pentafluoroprop-tetrafluoroeth (GEBAUERS) aerosol 1 application  1 application Topical PRN Placido Sou, MD      .  polyethylene glycol (MIRALAX / GLYCOLAX) packet 17 g  17 g Oral Daily Placido Sou, MD   17 g at 01/19/12 1044  . sodium chloride 0.9 % injection 3 mL  3 mL Intravenous Q12H Roger A Arguello, PA-C   3 mL at 01/18/12 2200  . sodium chloride 0.9 % injection 3 mL  3 mL Intravenous PRN Roger A Arguello, PA-C      . sorbitol 70 % solution 60 mL  60 mL Oral Q1H Placido Sou, MD   60 mL at 01/19/12 1513  . Ticagrelor (BRILINTA) tablet 90 mg  90 mg Oral BID Roger A Arguello, PA-C   90 mg at 01/19/12 1044  . zolpidem (AMBIEN) tablet 5 mg  5 mg Oral QHS PRN Meriel Pica, PA-C      . DISCONTD: 0.9 %  sodium chloride infusion   Intravenous Continuous Peter M Martinique, MD 10 mL/hr at 01/11/12 2130    . DISCONTD: cloNIDine (CATAPRES) tablet 0.1 mg  0.1 mg Oral BID Darlin Coco, MD   0.1 mg at 01/18/12 2131  . DISCONTD: darbepoetin (ARANESP) injection 60 mcg  60 mcg Intravenous Q Mon-HD Placido Sou, MD   60 mcg at 01/16/12 1348  . DISCONTD: dextrose 5 %-0.45 % sodium chloride infusion   Intravenous Continuous Brand Males, MD   10 mL/hr at 01/12/12 1728  . DISCONTD: feeding supplement (NEPRO CARB STEADY) liquid 237 mL  237 mL Oral PRN Jeneen Rinks  L Deterding, MD      . DISCONTD: heparin injection 1,000 Units  1,000 Units Dialysis PRN Placido Sou, MD      . DISCONTD: heparin injection 1,000 Units  1,000 Units Dialysis PRN Placido Sou, MD      . DISCONTD: heparin injection 1,300 Units  20 Units/kg Dialysis PRN Estanislado Emms, MD      . DISCONTD: heparin injection 2,300 Units  40 Units/kg Dialysis Once in dialysis Placido Sou, MD      . DISCONTD: heparin injection 2,400 Units  40 Units/kg Dialysis Once in dialysis Placido Sou, MD      . DISCONTD: lidocaine (XYLOCAINE) 1 % injection 5 mL  5 mL Intradermal PRN Placido Sou, MD      . DISCONTD: lidocaine-prilocaine (EMLA) cream 1 application  1 application Topical PRN Placido Sou, MD      . DISCONTD:  pentafluoroprop-tetrafluoroeth (GEBAUERS) aerosol 1 application  1 application Topical PRN Placido Sou, MD        Allergies as of 01/09/2012 - Review Complete 01/09/2012  Allergen Reaction Noted  . Aspirin Other (See Comments) 12/15/2011  . Contrast media (iodinated diagnostic agents) Itching 12/15/2011  . Macrodantin (nitrofurantoin macrocrystal) Other (See Comments) 12/15/2011  . Penicillins Other (See Comments) 12/15/2011  . Bactrim (sulfamethoxazole w-trimethoprim) Rash 12/15/2011  . Sulfa antibiotics Rash 12/15/2011  . Plavix (clopidogrel bisulfate) Rash 01/05/2012    History reviewed. No pertinent family history. No [place] of colon cancer  Review of Systems: All systems reviewed and negative except where noted in HPI  PHYSICAL EXAM: Vital signs in last 24 hours: Temp:  [97.9 F (36.6 C)-99.3 F (37.4 C)] 97.9 F (36.6 C) (07/11 1112) Pulse Rate:  [93-105] 97  (07/11 1112) Resp:  [16-18] 16  (07/11 0000) BP: (141-188)/(55-72) 188/69 mmHg (07/11 1101) SpO2:  [95 %-98 %] 95 % (07/11 1112) Weight:  [127 lb 10.3 oz (57.9 kg)] 127 lb 10.3 oz (57.9 kg) (07/11 0600) Last BM Date: 01/16/12 General:   pleasant black female in NAD Head:  Normocephalic and atraumatic. Eyes:   No icterus.   Conjunctiva pale. Ears:  Normal auditory acuity. .Neck:  Supple; no masses felt Lungs:  Respirations even and unlabored. Lungs clear to auscultation bilaterally.   No wheezes, crackles, or rhonchi.  Heart:  Regular rate and rhythm; no murmurs heard. Abdomen:  Soft, nondistended, nontender. Normal bowel sounds. No appreciable masses or hepatomegaly.  Rectal:  Soft black heme positive stool Msk:  Symmetrical without gross deformities.  Extremities:  Without edema. Neurologic:  Alert and  oriented x4;  grossly normal neurologically. Skin:  Intact without significant lesions or rashes. Cervical Nodes:  No significant cervical adenopathy. Psych:  Alert and cooperative. Normal affect.  LAB  RESULTS:  Basename 01/19/12 0620 01/17/12 0528  WBC 12.7* 10.5  HGB 8.1* 9.3*  HCT 25.1* 29.0*  PLT 380 377   BMET  Basename 01/19/12 0620 01/18/12 1140 01/18/12 0528  NA 137 136 138  K 3.9 4.0 4.0  CL 94* 92* 93*  CO2 27 26 27   GLUCOSE 111* 176* 120*  BUN 32* 71* 66*  CREATININE 4.74* 8.98* 8.42*  CALCIUM 9.2 9.8 10.1   LFT  Basename 01/18/12 1140  PROT 6.5  ALBUMIN 3.2*  AST 18  ALT 16  ALKPHOS 57  BILITOT 0.2*  BILIDIR --  IBILI --     PREVIOUS ENDOSCOPIES: Colonoscopy in January in [place] per patient - ? polpys  IMPRESSION / PLAN:  1. [place] anemia. Hemoglobin has fallen from 10.2 to 8.1 over last couple of days on Brilinta. No overt bleeding until just now. Patient was constipated,given laxatives and had large amount of black, heme positive Takes iron at home but states it doesn't make stools this black. It is conceivable that stools could be heme positive from previous hemoptysis but I think with declining hemoglobin this represents UGI bleed. Patient needs EGD in am. Will put her on a PPI drip as she has potential for major bleed on Brilinta + ASA.  Monitor hemoglobin, transfuse if indicated.   2. History of bleeding ulcer last year per patient.   3. GERD, on  Protonix at home.    4 CAD. NSTEMI in June, s/p DES. Now on Brilinta and ASA.  5. Multiple medical problems as listed in HPI.   Thanks    LOS: 10 days   Tye Savoy  01/19/2012, 3:56 PM

## 2012-01-19 NOTE — Progress Notes (Signed)
CARDIAC REHAB PHASE I   PRE:  Rate/Rhythm: 97 SR    BP: sitting 150/54    SaO2: 94 RA  MODE:  Ambulation: 270 ft   POST:  Rate/Rhythm: 118 ST    BP: sitting 188/69     SaO2: 99 RA  Slightly tired today but tolerated well with RW, assist x1. Steady. Did not want to increase distance. HR up. To recliner. [id number]  Darrick Meigs CES, ACSM

## 2012-01-19 NOTE — Progress Notes (Signed)
PT Cancellation Note  Treatment cancelled today due to medical issues with patient which prohibited therapy.  Shawna [name] 01/19/2012, 5:58 PM

## 2012-01-19 NOTE — Consult Note (Signed)
I have taken a history, examined the patient and reviewed the chart. I agree with the extender's note, impression and recommendations. Progressive anemia over past few days with constipation and today developed melena, presumed UGI bleeding. She had a NSTEMI with DES placed in June and is taking Brilinta and ASA. Monitor H/H closely. Hold Brilinta and ASA if possible. IV PPI infusion. EGD tomorrow. She is at a higher risk for cardiopulmonary complications with EGD/sedation given comorbidities and recent MI but GI bleed needs to be further investigated.  Ladene Artist MD [facility]

## 2012-01-20 ENCOUNTER — Inpatient Hospital Stay (HOSPITAL_COMMUNITY): Payer: Medicare Other

## 2012-01-20 ENCOUNTER — Encounter (HOSPITAL_COMMUNITY)
Admission: EM | Disposition: A | Discharge status: Home Health | Payer: Self-pay | Source: Other Acute Inpatient Hospital | Attending: Cardiology

## 2012-01-20 ENCOUNTER — Encounter (HOSPITAL_COMMUNITY): Payer: Self-pay | Admitting: *Deleted

## 2012-01-20 DIAGNOSIS — K5520 Angiodysplasia of colon without hemorrhage: Secondary | ICD-10-CM

## 2012-01-20 DIAGNOSIS — K31811 Angiodysplasia of stomach and duodenum with bleeding: Secondary | ICD-10-CM

## 2012-01-20 DIAGNOSIS — K5521 Angiodysplasia of colon with hemorrhage: Secondary | ICD-10-CM

## 2012-01-20 HISTORY — PX: ESOPHAGOGASTRODUODENOSCOPY: SHX5428

## 2012-01-20 LAB — BASIC METABOLIC PANEL
BUN: 50 mg/dL — ABNORMAL HIGH (ref 6–23)
Creatinine, Ser: 7.41 mg/dL — ABNORMAL HIGH (ref 0.50–1.10)
GFR calc Af Amer: 6 mL/min — ABNORMAL LOW (ref >90)
GFR calc non Af Amer: 5 mL/min — ABNORMAL LOW (ref >90)
Glucose, Bld: 129 mg/dL — ABNORMAL HIGH (ref 70–99)
Potassium: 3.9 mEq/L (ref 3.5–5.1)

## 2012-01-20 LAB — CBC
HCT: 23.2 % — ABNORMAL LOW (ref 36.0–46.0)
Hemoglobin: 8.1 g/dL — ABNORMAL LOW (ref 12.0–15.0)
Hemoglobin: 7.7 g/dL — ABNORMAL LOW (ref 12.0–15.0)
MCH: 29 pg (ref 26.0–34.0)
MCHC: 32.1 g/dL (ref 30.0–36.0)
MCV: 88.9 fL (ref 78.0–100.0)
RBC: 2.61 MIL/uL — ABNORMAL LOW (ref 3.87–5.11)
RDW: 16 % — ABNORMAL HIGH (ref 11.5–15.5)
WBC: 17.9 10*3/uL — ABNORMAL HIGH (ref 4.0–10.5)

## 2012-01-20 SURGERY — EGD (ESOPHAGOGASTRODUODENOSCOPY)
Anesthesia: Moderate Sedation

## 2012-01-20 MED ORDER — FENTANYL CITRATE 0.05 MG/ML IJ SOLN
INTRAMUSCULAR | Status: AC
  Filled 2012-01-20: qty 2

## 2012-01-20 MED ORDER — SODIUM CHLORIDE 0.45 % IV SOLN: INTRAVENOUS | Status: DC

## 2012-01-20 MED ORDER — GLUCAGON HCL (RDNA) 1 MG IJ SOLR
INTRAMUSCULAR | Status: AC
  Filled 2012-01-20: qty 1

## 2012-01-20 MED ORDER — SODIUM CHLORIDE 0.9 % IJ SOLN
INTRAMUSCULAR | Status: DC | PRN
  Administered 2012-01-20: 14:00:00

## 2012-01-20 MED ORDER — DIPHENHYDRAMINE HCL 50 MG/ML IJ SOLN
INTRAMUSCULAR | Status: AC
  Filled 2012-01-20: qty 1

## 2012-01-20 MED ORDER — GLUCAGON HCL (RDNA) 1 MG IJ SOLR
INTRAMUSCULAR | Status: DC | PRN
  Administered 2012-01-20 (×2): .5 mL via INTRAVENOUS

## 2012-01-20 MED ORDER — MIDAZOLAM HCL 10 MG/2ML IJ SOLN
INTRAMUSCULAR | Status: DC | PRN
  Administered 2012-01-20 (×2): 1 mg via INTRAVENOUS
  Administered 2012-01-20 (×2): 2 mg via INTRAVENOUS

## 2012-01-20 MED ORDER — EPINEPHRINE HCL 0.1 MG/ML IJ SOLN
INTRAMUSCULAR | Status: AC
  Filled 2012-01-20: qty 10

## 2012-01-20 MED ORDER — METOPROLOL TARTRATE 25 MG PO TABS
25.0000 mg | ORAL_TABLET | Freq: Two times a day (BID) | ORAL | Status: DC
  Administered 2012-01-20 – 2012-01-25 (×11): 25 mg via ORAL
  Filled 2012-01-20 (×12): qty 1

## 2012-01-20 MED ORDER — MIDAZOLAM HCL 10 MG/2ML IJ SOLN
INTRAMUSCULAR | Status: AC
  Filled 2012-01-20: qty 2

## 2012-01-20 MED ORDER — FENTANYL CITRATE 0.05 MG/ML IJ SOLN
INTRAMUSCULAR | Status: DC | PRN
  Administered 2012-01-20 (×3): 25 ug via INTRAVENOUS

## 2012-01-20 MED ORDER — BUTAMBEN-TETRACAINE-BENZOCAINE 2-2-14 % EX AERO
INHALATION_SPRAY | CUTANEOUS | Status: DC | PRN
  Administered 2012-01-20: 1 via TOPICAL

## 2012-01-20 MED ORDER — SODIUM CHLORIDE 0.45 % IV SOLN
Freq: Once | INTRAVENOUS | Status: AC
  Administered 2012-01-20: 1000 mL via INTRAVENOUS

## 2012-01-20 NOTE — Progress Notes (Signed)
Subjective:  Finally had good BM following laxative yesterday. Stool yesterday was positive for blood. Patient to have EGD today. Appreciate GI consult. Hgb stable 8.1  Still having cough which she states has been present for 2 month. Sputum not dark. BP and pulse running high today.      Objective:  Vital Signs in the last 24 hours: Temp:  [97.9 F (36.6 C)-98.2 F (36.8 C)] 97.9 F (36.6 C) (07/12 0400) Pulse Rate:  [97-109] 101  (07/11 2104) Resp:  [18-20] 18  (07/12 0400) BP: (152-188)/(64-77) 181/74 mmHg (07/12 0400) SpO2:  [95 %-97 %] 95 % (07/12 0400) Weight:  [126 lb 1.7 oz (57.2 kg)] 126 lb 1.7 oz (57.2 kg) (07/12 0500)  Intake/Output from previous day: 07/11 0701 - 07/12 0700 In: 946.7 [P.O.:720; I.V.:226.7] Out: -  Intake/Output from this shift:       . sodium chloride   Intravenous Once  . allopurinol  300 mg Oral Daily  . amLODipine  10 mg Oral QHS  . aspirin  81 mg Oral Daily  . atorvastatin  20 mg Oral q1800  . darbepoetin (ARANESP) injection - DIALYSIS  150 mcg Intravenous Q Mon-HD  . ferric gluconate (FERRLECIT/NULECIT) IV  125 mg Intravenous Q M,W,F-HD  . fluticasone  2 spray Each Nare Daily  . guaiFENesin  600 mg Oral BID  . heparin  40 Units/kg Dialysis Once in dialysis  . lanthanum  1,000 mg Oral TID WC  . loratadine  10 mg Oral Daily  . metoprolol tartrate  25 mg Oral BID  . multivitamin  1 tablet Oral QHS  . polyethylene glycol  17 g Oral Daily  . sodium chloride  3 mL Intravenous Q12H  . sorbitol  60 mL Oral Q1H  . Ticagrelor  90 mg Oral BID  . DISCONTD: cloNIDine  0.1 mg Oral BID  . DISCONTD: darbepoetin (ARANESP) injection - DIALYSIS  60 mcg Intravenous Q Mon-HD  . DISCONTD: heparin  40 Units/kg Dialysis Once in dialysis  . DISCONTD: heparin  40 Units/kg Dialysis Once in dialysis  . DISCONTD: metoprolol tartrate  12.5 mg Oral BID  . DISCONTD: pantoprazole  40 mg Oral BID AC      . pantoprozole (PROTONIX) infusion 8 mg/hr (01/20/12  0708)    Physical Exam: The patient appears to be in no distress.  Head and neck exam reveals that the pupils are equal and reactive.  The extraocular movements are full.  There is no scleral icterus.  Mouth and pharynx are benign.  No lymphadenopathy.  No carotid bruits.  The jugular venous pressure is normal.  Thyroid is not enlarged or tender.  Chest is clear to percussion and auscultation.  No rales or rhonchi.  Expansion of the chest is symmetrical.  Heart reveals no abnormal lift or heave.  First and second heart sounds are normal.  There is no murmur gallop rub or click.  The abdomen is soft and nontender.  Bowel sounds are normoactive.  There is no hepatosplenomegaly or mass.  There are no abdominal bruits.  Extremities reveal no phlebitis or edema.  Pedal pulses are good.  There is no cyanosis or clubbing.  Neurologic exam is normal strength and no lateralizing weakness.  No sensory deficits.  Integument reveals no rash  Lab Results:  Basename 01/20/12 0535 01/19/12 0620  WBC 16.3* 12.7*  HGB 8.1* 8.1*  PLT 431* 380    Basename 01/20/12 0535 01/19/12 0620  NA 134* 137  K 3.9 3.9  CL 91* 94*  CO2 22 27  GLUCOSE 129* 111*  BUN 50* 32*  CREATININE 7.41* 4.74*   No results found for this basename: TROPONINI:2,CK,MB:2 in the last 72 hours Hepatic Function Panel  Basename 01/18/12 1140  PROT 6.5  ALBUMIN 3.2*  AST 18  ALT 16  ALKPHOS 58  BILITOT 0.2*  BILIDIR --  IBILI --   No results found for this basename: CHOL in the last 72 hours No results found for this basename: PROTIME in the last 72 hours  Imaging: Imaging results have been reviewed  Cardiac Studies:  Assessment/Plan:  Patient Active Hospital Problem List: CAD (coronary artery disease) (12/16/2011)   Assessment: Recent DES stent   Plan: Need to continue ASA and Brilinta HTN (hypertension) (12/16/2011)   Assessment: BP remaining high. Pulse 104   Plan: Increase metoprolol to 25 mg BID  ESRD  (end stage renal disease) (12/16/2011)   Assessment: Nephrology following.   Plan: HD today  Acute posthemorrhagic anemia (01/19/2012)   Assessment: Hgb 8.1 unchanged   Plan: EGD today  Possible home over weekend if stable.  Partial DC summary already done.   LOS: 11 days    Darlin Coco 01/20/2012, 7:36 AM

## 2012-01-20 NOTE — Progress Notes (Signed)
Subjective: Interval History: has no complaint of constip better.  Objective: Vital signs in last 24 hours: Temp:  [97.9 F (36.6 C)-98.4 F (36.9 C)] 98.4 F (36.9 C) (07/12 0743) Pulse Rate:  [97-109] 97  (07/12 0743) Resp:  [18-20] 18  (07/12 0400) BP: (164-188)/(64-77) 178/66 mmHg (07/12 0743) SpO2:  [95 %-98 %] 98 % (07/12 0743) Weight:  [57.2 kg (126 lb 1.7 oz)] 57.2 kg (126 lb 1.7 oz) (07/12 0500) Weight change: -2.2 kg (-4 lb 13.6 oz)  Intake/Output from previous day: 07/11 0701 - 07/12 0700 In: 946.7 [P.O.:720; I.V.:226.7] Out: -  Intake/Output this shift:    General appearance: alert, cooperative and appears stated age Resp: clear to auscultation bilaterally Cardio: S1, S2 normal and systolic murmur: holosystolic 2/6, blowing at apex GI: soft,pos bs, liver down 4 cm Extremities: AVF LUA, B&T  Lab Results:  [facility] 01/20/12 0535 01/19/12 0620  WBC 16.3* 12.7*  HGB 8.1* 8.1*  HCT 25.2* 25.1*  PLT 431* 380   BMET:  Basename 01/20/12 0535 01/19/12 0620  NA 134* 137  K 3.9 3.9  CL 91* 94*  CO2 22 27  GLUCOSE 129* 111*  BUN 50* 32*  CREATININE 7.41* 4.74*  CALCIUM 9.6 9.2   No results found for this basename: PTH:2 in the last 72 hours Iron Studies: No results found for this basename: IRON,TIBC,TRANSFERRIN,FERRITIN in the last 72 hours  Studies/Results: No results found.  I have reviewed the patient's current medications.  Assessment/Plan: 1 CRF for HD. Vol fair 2 Anemia epo/fe 3 GIB per GI 4 CAD per Cards 5 ^ BP meds, HD  P HD, epo/fe, endo     LOS: 11 days   Adjoa Althouse L 01/20/2012,8:11 AM

## 2012-01-20 NOTE — H&P (View-Only) (Signed)
NAME:  Shawna [name], Shawna [name] ROOM: 250  UNIT NUMBER:  [id number] LOCATION: [facility] 250 06 ADM/VISIT DATE:  01/08/2012   ADM PHYSCaryl Bis:  [id number] DOB: 10-13-1940   SERVICE:  [facility]  CARDIOLOGIST:  [facility]  PRIMARY CARE PHYSICIAN:  Hospitalist service  HISTORY OF PRESENT ILLNESS:  The patient is a 71 year old female who was, a few weeks ago, admitted to [facility] after she ruled in for non-ST elevation myocardial infarction.  She initially underwent a dobutamine stress echocardiogram, which yielded lateral ischemia.  She was then referred for cardiac catheterization and was found to have triple vessel CAD with severe circumflex disease and diagonal branch disease and moderate LAD disease.  She underwent successful DES of the circumflex and diagonal artery with no noted complications.  Ejection fraction was 55%.  No significant mitral regurgitation.  The patient was placed on aspirin and Plavix; however, she developed a rash secondary to Plavix.  She called in to the office and was given a prescription for Brilinta.  Unfortunately, she never picked up her Brilinta.  Of note also, the patient has end-stage renal disease and is on hemodialysis.  The patient was admitted on 01/08/2012 at the [facility] after complaining of chest tightness that woke her up in the middle of the night, followed by an episode of cough and shortness of breath that lasted 30 minutes.  Initially, the patient had negative cardiac enzyme markers.  Chest x-ray shows, however, cardiomegaly with central venous congestion.  The patient underwent hemodialysis, and the plan was to let her go home.  However, it was noted that the patient was not taking Brilinta, and I am not sure that even Brilinta was started during this hospitalization.  Subsequently, yesterday, the patient became acutely short of breath and in respiratory distress with an x-ray that showed marked pulmonary edema.  She was intubated  and was placed on mechanical ventilation.  She is currently hemodynamically stable with a significant rise in her troponins to 2.8.  EKG showed left ventricular hypertrophy and nonspecific ST-T wave changes, although there is some ST-segment depression inferior leads.  The patient is currently sedated on propofol but hemodynamically stable, albeit with a sinus tachycardia.  Saturations 99%, and she is on minimal ventilator settings with a peak airway pressure of 20 cm of water and PEEP of 5 cm of water.  She is on 1 to 3.8 I:E ratio with a tidal volume of 500, peak flow of 60 with a ramp waveform.  She is also on propofol for sedation.  PAST MEDICAL HISTORY: 1. End-stage renal disease on hemodialysis Monday, Wednesday, Friday. 2. Hypertension. 3. Congestive heart failure. 4. History of angina. 5. Chronic bronchitis. 6. Pneumonia. 7. Anemia. 8. History of blood transfusion. 9. GERD. 10. Stomach ulcers. 11. History of GI bleeding. 12. Arthritis. 13. History of gout. 14. Anxiety. 15. Ovarian cancer. 16. Colon cancer. 17. CAD, 12/29/2011, status post DES to the circumflex and diagonal. a. Positive dobutamine stress echocardiogram after non-ST elevation myocardial infarction.  PAST SURGICAL HISTORY: 1. Coronary angioplasty with stent placement 12/15/2011. 2. Abdominal hysterectomy. 3. Appendectomy. 4. Tubal ligation. 5. AV fistula placement left upper arm. 6. Thrombectomy/AV graft revision, left upper arm. 7. Colon resection.  MEDICATIONS" 1. Protonix. 2. Heparin.  SOCIAL HISTORY:  The patient is married.  The patient never smoked.  She denies any alcohol use; this is all per records as the patient is unresponsive.  REVIEW OF SYSTEMS:  Unobtainable because the patient is intubated.  PHYSICAL EXAMINATION:  Vital signs:  Blood pressure 109/71, heart rate is 111, respirations 14, temperature 99, initial temperature earlier was 100 degrees.  General:  A 71 year old female, intubated.   HEENT:  PERRLA, EOMI.  Sclerae clear.  No xanthelasma.  Neck:  Palpable bilateral carotid pulses.  No bruits.  JVD is difficult to evaluate.  Lungs:  Scattered rhonchi with crackles bilaterally.  Heart:  Regular rate and rhythm, tachycardic.  Normal S1, S2.  Positive S3, but no pathological murmurs.  Abdomen:  Soft, unable to evaluate tenderness due to the fact the patient is on propofol.  Extremities:  Intact distal pulses.  No significant peripheral edema.  Skin:  Warm and dry.  Musculoskeletal:  No joint deformities.  Neuro:  No focal deficits.  Normal affect.  LABORATORY WORK:  Blood gas:  PH 7.59, pCO2 of 31, pO2 114, bicarb 29.7, CO2 is 30.7, saturation is 100%.  White count 11,600, hemoglobin 9.5, hematocrit 29.9, platelet count 302,000.  Troponin 1.45, then 2.8.  ASSESSMENT: 1. Acute respiratory failure secondary to pulmonary edema. 2. Non-ST elevation myocardial infarction. a. Rule out stent thrombosis. b. New wall motion abnormality inferior inferoseptal wall with LV dysfunction, ejection fraction 35-40%, previous ejection fraction 55%. 3. Low-grade fever, questionable secondary to myocardial infarction. 4. Ventilator-dependent respiratory failure secondary to pulmonary edema. 5. Respiratory alkalosis. 6. Coronary artery disease, status post recent stent placement with a drug-eluting platform to the circumflex and the diagonal. a. The patient is allergic to PLAVIX with rash. b. The patient never picked up Brilinta, which was called in for Plavix.  PLAN: 1. I suspect that the patient has stent thrombosis, although her wall motion abnormalities by echo are in a slightly different territory of her prior stent placement. 2. The patient was dialyzed yesterday, and she does not appear to be volume overloaded anymore. 3. The patient is currently on heparin, and we will give the patient 180 mg of Brilinta, followed by Brilinta 90 mg p.o. b.i.d. 4. I have made adjustments to the ventilator, and  we will put the patient on IMV of 6 or 8, PEEP of 5, pressure support of 8 to 10 cm of water and discontinue propofol.  The patient can be intermittently sedated with Versed.  I do anticipate that the patient can be extubated; although if we proceed with a catheterization today, it might be preferable to leave the patient intubated. 5. I spoke with Dr. Lia Foyer and notified him that the patient will be transferred.  He suggested that we discharge the patient first to decide on the timing of the diagnostic cardiac catheterization. 6. [facility] will be called, and the patient will be transferred as soon as possible.   __________________________    Terald Sleeper, M.D. Dairl Ponder D: 01/09/2012 1114 T: 01/09/2012 1151 P: DEG  Al Decant Gent 12:46 PM 01/09/2012

## 2012-01-20 NOTE — Progress Notes (Signed)
1515 Cardiac Rehab Pt just back from EGD states that her stomach hurts and she still has to go to diaylsis. She declines offer to now. Will continue to follow pt.

## 2012-01-20 NOTE — Progress Notes (Signed)
Nutrition Follow-up  Intervention:   none  Assessment:   Pt continues with HD, renal education provided 7/11 per pt request for more information.  Pt with new blood in stool, Hgb trending down. EGD planned for today.    Diet Order:  NPO now for EGD today, previously was on Renal 80/90 2-2-2 1200 ml PO intake: 50-100% when documented  Supplements: Nepro shakes PRN  Meds: Scheduled Meds:   . sodium chloride   Intravenous Once  . allopurinol  300 mg Oral Daily  . amLODipine  10 mg Oral QHS  . aspirin  81 mg Oral Daily  . atorvastatin  20 mg Oral q1800  . darbepoetin (ARANESP) injection - DIALYSIS  150 mcg Intravenous Q Mon-HD  . ferric gluconate (FERRLECIT/NULECIT) IV  125 mg Intravenous Q M,W,F-HD  . fluticasone  2 spray Each Nare Daily  . guaiFENesin  600 mg Oral BID  . heparin  40 Units/kg Dialysis Once in dialysis  . lanthanum  1,000 mg Oral TID WC  . loratadine  10 mg Oral Daily  . metoprolol tartrate  25 mg Oral BID  . multivitamin  1 tablet Oral QHS  . polyethylene glycol  17 g Oral Daily  . sodium chloride  3 mL Intravenous Q12H  . sorbitol  60 mL Oral Q1H  . Ticagrelor  90 mg Oral BID  . DISCONTD: heparin  40 Units/kg Dialysis Once in dialysis  . DISCONTD: metoprolol tartrate  12.5 mg Oral BID  . DISCONTD: pantoprazole  40 mg Oral BID AC   Continuous Infusions:   . sodium chloride    . pantoprozole (PROTONIX) infusion 8 mg/hr (01/20/12 0708)   PRN Meds:.sodium chloride, sodium chloride, sodium chloride, sodium chloride, sodium chloride, acetaminophen, albuterol, ALPRAZolam, alteplase, camphor-menthol, diphenhydrAMINE, feeding supplement (NEPRO CARB STEADY), guaiFENesin, labetalol, lidocaine, lidocaine-prilocaine, menthol-cetylpyridinium, nitroGLYCERIN, ondansetron (ZOFRAN) IV, pentafluoroprop-tetrafluoroeth, sodium chloride, zolpidem, DISCONTD: heparin, DISCONTD: heparin  Labs:  CMP     Component Value Date/Time   NA 134* 01/20/2012 0535   K 3.9 01/20/2012 0535   CL 91* 01/20/2012 0535   CO2 22 01/20/2012 0535   GLUCOSE 129* 01/20/2012 0535   BUN 50* 01/20/2012 0535   CREATININE 7.41* 01/20/2012 0535   CALCIUM 9.6 01/20/2012 0535   PROT 6.5 01/18/2012 1140   ALBUMIN 3.2* 01/18/2012 1140   AST 18 01/18/2012 1140   ALT 16 01/18/2012 1140   ALKPHOS 58 01/18/2012 1140   BILITOT 0.2* 01/18/2012 1140   GFRNONAA 5* 01/20/2012 0535   GFRAA 6* 01/20/2012 0535   Hemoglobin  Date/Time Value Range Status  01/20/2012  5:35 AM 8.1* 12.0 - 15.0 g/dL Final      Intake/Output Summary (Last 24 hours) at 01/20/12 1221 Last data filed at 01/20/12 0800  Gross per 24 hour  Intake 516.67 ml  Output      0 ml  Net 516.67 ml    Weight Status:  126 lbs, trending down, net neg for this admission.  Admission weight: 144 lbs Body mass index is 23.06 kg/(m^2). WNL  Re-estimated needs:  1900-2100 kcal, 70-80 gm protein  Nutrition Dx:  No active dx  Goal:  Intake to meet >90% of estimated nutrition needs, likely met.   Monitor:  PO intake, labs, weights   Orson Slick RD, LDN Pager [phone] After Hours pager [phone]

## 2012-01-20 NOTE — Op Note (Signed)
[facility] [place], [place]  [id number]  ENDOSCOPY PROCEDURE REPORT  PATIENT:  Shawna [name], Shawna [name]  MR#:  [id number] BIRTHDATE:  09/28/1940, 70 yrs. old  GENDER:  female ENDOSCOPIST:  Norberto Sorenson T. Fuller Plan, MD, [facility] Referred by:  Darlin Coco, M.D. PROCEDURE DATE:  01/20/2012 PROCEDURE:  EGD for control of bleeding ASA CLASS:  Class III INDICATIONS:  hemorrhage of GI tract, melena MEDICATIONS:  These medications were titrated to patient response per physician's verbal order, Fentanyl 50 mcg IV, Versed 5 mg IV, glucagon 1 mg IV TOPICAL ANESTHETIC:  Cetacaine Spray DESCRIPTION OF PROCEDURE:   After the risks benefits and alternatives of the procedure were thoroughly explained, informed consent was obtained.  The Pentax Gastroscope [id number] endoscope was introduced through the mouth and advanced to the third portion of the duodenum, without limitations.  The instrument was slowly withdrawn as the mucosa was fully examined. <<PROCEDUREIMAGES>> An a.v. malformation was found in the descending duodenum. It was bleeding. It was 3 mm in size. Epinephrine injection with 5 cc of 1:10,000. Bipolar coagulation therapy was applied with hemostasis acheived. Otherwise normal duodenum. Mild gastritis was found in the body and the antrum of the stomach. Otherwise normal stomach. The esophagus and gastroesophageal junction were completely normal in appearance.    Retroflexed views revealed a hiatal hernia, small.  The scope was then withdrawn from the patient and the procedure completed.  COMPLICATIONS:  None  ENDOSCOPIC IMPRESSION: 1) AV malformation in the descending duodenum 2) Mild gastritis 3) Small hiatal hernia  RECOMMENDATIONS: 1) PPI gtt 2) Minimize/avoid ASA/NSAIDs/antiplatelet agents if possible  Nakeia Calvi T. Fuller Plan, MD, [facility]  n. eSIGNED:   Pricilla Riffle. Heena Woodbury at 01/20/2012 02:10 PM  [name], [id number]

## 2012-01-20 NOTE — Progress Notes (Addendum)
Physical Therapy Treatment and Discharge  Patient Details Name: Shawna [name] MRN: [id number] DOB: 08/06/1940 Today's Date: 01/20/2012 Time: 1600-1630 PT Time Calculation (min): 30 min  PT Assessment / Plan / Recommendation Comments on Treatment Session  Pt s/p upper endoscopy for GI bleed.  Pt demonstrates safety and independence with all mobility and presents with no further acute PT needs.  Acute PT signing off.  Suggested pt ambulate with nursing to prevent weakness from inactivity.     Follow Up Recommendations  No PT follow up    Barriers to Discharge        Equipment Recommendations  None recommended by PT    Recommendations for Other Services    Frequency     Plan All goals met and education completed, patient dischaged from PT services    Precautions / Restrictions Precautions Precautions: Fall Restrictions Weight Bearing Restrictions: No   Pertinent Vitals/Pain No c/o pain.  BP 173/93 prior to ambulating.  RN notified.      Mobility  Bed Mobility Bed Mobility: Supine to Sit;Sit to Supine Supine to Sit: 7: Independent;HOB flat Sitting - Scoot to Edge of Bed: 7: Independent Sit to Supine: 7: Independent;HOB flat Transfers Transfers: Sit to Stand;Stand to Sit Sit to Stand: 7: Independent;From bed;With upper extremity assist Stand to Sit: 7: Independent;To bed;With upper extremity assist Ambulation/Gait Ambulation/Gait Assistance: 7: Independent Ambulation Distance (Feet): 300 Feet Assistive device: None Ambulation/Gait Assistance Details: Pt demonstates safety and independence.  Gait Pattern: Within Functional Limits Gait velocity: WFL Stairs: No Wheelchair Mobility Wheelchair Mobility: No    Exercises     PT Diagnosis:    PT Problem List:   PT Treatment Interventions:     PT Goals Acute Rehab PT Goals PT Goal Formulation: With patient Time For Goal Achievement: 01/27/12 Potential to Achieve Goals: Good Pt will go Supine/Side to Sit:  Independently PT Goal: Supine/Side to Sit - Progress: Met Pt will go Sit to Stand: Independently;without upper extremity assist PT Goal: Sit to Stand - Progress: Met Pt will Transfer Bed to Chair/Chair to Bed: Independently PT Transfer Goal: Bed to Chair/Chair to Bed - Progress: Met Pt will Ambulate: 51 - 150 feet;with supervision;with least restrictive assistive device PT Goal: Ambulate - Progress: Met  Visit Information  Last PT Received On: 01/20/12    Subjective Data  Subjective: I have not eaten all day.  I had some kind of test today.  Patient Stated Goal: to get home   Cognition  Overall Cognitive Status: Appears within functional limits for tasks assessed/performed Arousal/Alertness: Awake/alert Orientation Level: Appears intact for tasks assessed Behavior During Session: [facility] for tasks performed    Balance  Balance Balance Assessed: Yes Static Standing Balance Static Standing - Balance Support: No upper extremity supported Static Standing - Level of Assistance: 7: Independent Dynamic Standing Balance Dynamic Standing - Balance Support: No upper extremity supported Dynamic Standing - Level of Assistance: 7: Independent Dynamic Standing - Balance Activities: Forward lean/weight shifting;Reaching for objects;Reaching across midline  End of Session PT - End of Session Equipment Utilized During Treatment: Gait belt Activity Tolerance: Patient tolerated treatment well Patient left: in bed;with call bell/phone within reach;with family/visitor present Nurse Communication: Mobility status   GP     Shawna [name] 01/20/2012, 6:05 PM Shawna [name] Shawna [name] DPT [phone]

## 2012-01-20 NOTE — Interval H&P Note (Signed)
History and Physical Interval Note:  01/20/2012 1:20 PM  Shawna [name]  has presented today for surgery, with the diagnosis of melena, anemia  The various methods of treatment have been discussed with the patient and family. After consideration of risks, benefits and other options for treatment, the patient has consented to  Procedure(s) (LRB): ESOPHAGOGASTRODUODENOSCOPY (EGD) (N/A) as a surgical intervention .  The patient's history has been reviewed, patient examined, no change in status, stable for surgery.  I have reviewed the patients' chart and labs.  Questions were answered to the patient's satisfaction.     Pricilla Riffle. Fuller Plan MD [facility]

## 2012-01-20 NOTE — OR Nursing (Signed)
Late entry for 1445: After waking up from sedation in Endoscopy Department, pt c/o some lower abdominal pain.  Rated pain 7/10, and described it as an "ache".  Abdomen was soft and non-distended upon assessment. VSS and back to baseline pre-procedure.  Discussed with pt and floor RN Helene Kelp that this could be pain from gas post-procedure, and to monitor closely and try to pass the air.  If pain persisted or became worse, to call endoscopy department or GI MD on call for questions/concerns.  Pt and floor RN verbalized understanding of instructions.  Ethelene Hal, RN

## 2012-01-21 ENCOUNTER — Inpatient Hospital Stay (HOSPITAL_COMMUNITY): Payer: Medicare Other

## 2012-01-21 LAB — BASIC METABOLIC PANEL
BUN: 11 mg/dL (ref 6–23)
Chloride: 99 mEq/L (ref 96–112)
GFR calc Af Amer: 17 mL/min — ABNORMAL LOW (ref >90)
Glucose, Bld: 114 mg/dL — ABNORMAL HIGH (ref 70–99)
Potassium: 3.6 mEq/L (ref 3.5–5.1)

## 2012-01-21 LAB — RENAL FUNCTION PANEL
BUN: 60 mg/dL — ABNORMAL HIGH (ref 6–23)
CO2: 21 mEq/L (ref 19–32)
Glucose, Bld: 107 mg/dL — ABNORMAL HIGH (ref 70–99)
Phosphorus: 4.2 mg/dL (ref 2.3–4.6)
Potassium: 3.6 mEq/L (ref 3.5–5.1)

## 2012-01-21 LAB — CBC
HCT: 23.3 % — ABNORMAL LOW (ref 36.0–46.0)
Hemoglobin: 7.6 g/dL — ABNORMAL LOW (ref 12.0–15.0)
MCHC: 32.6 g/dL (ref 30.0–36.0)

## 2012-01-21 IMAGING — CR DG CHEST 1V PORT
1 series · 1 of 1 positions shown · non-contrast
Comparison: [DATE] CT.  [DATE] chest x-ray and [DATE]
chest x-ray.

CLINICAL DATA: Cough.

PORTABLE CHEST - 1 VIEW

[AP]
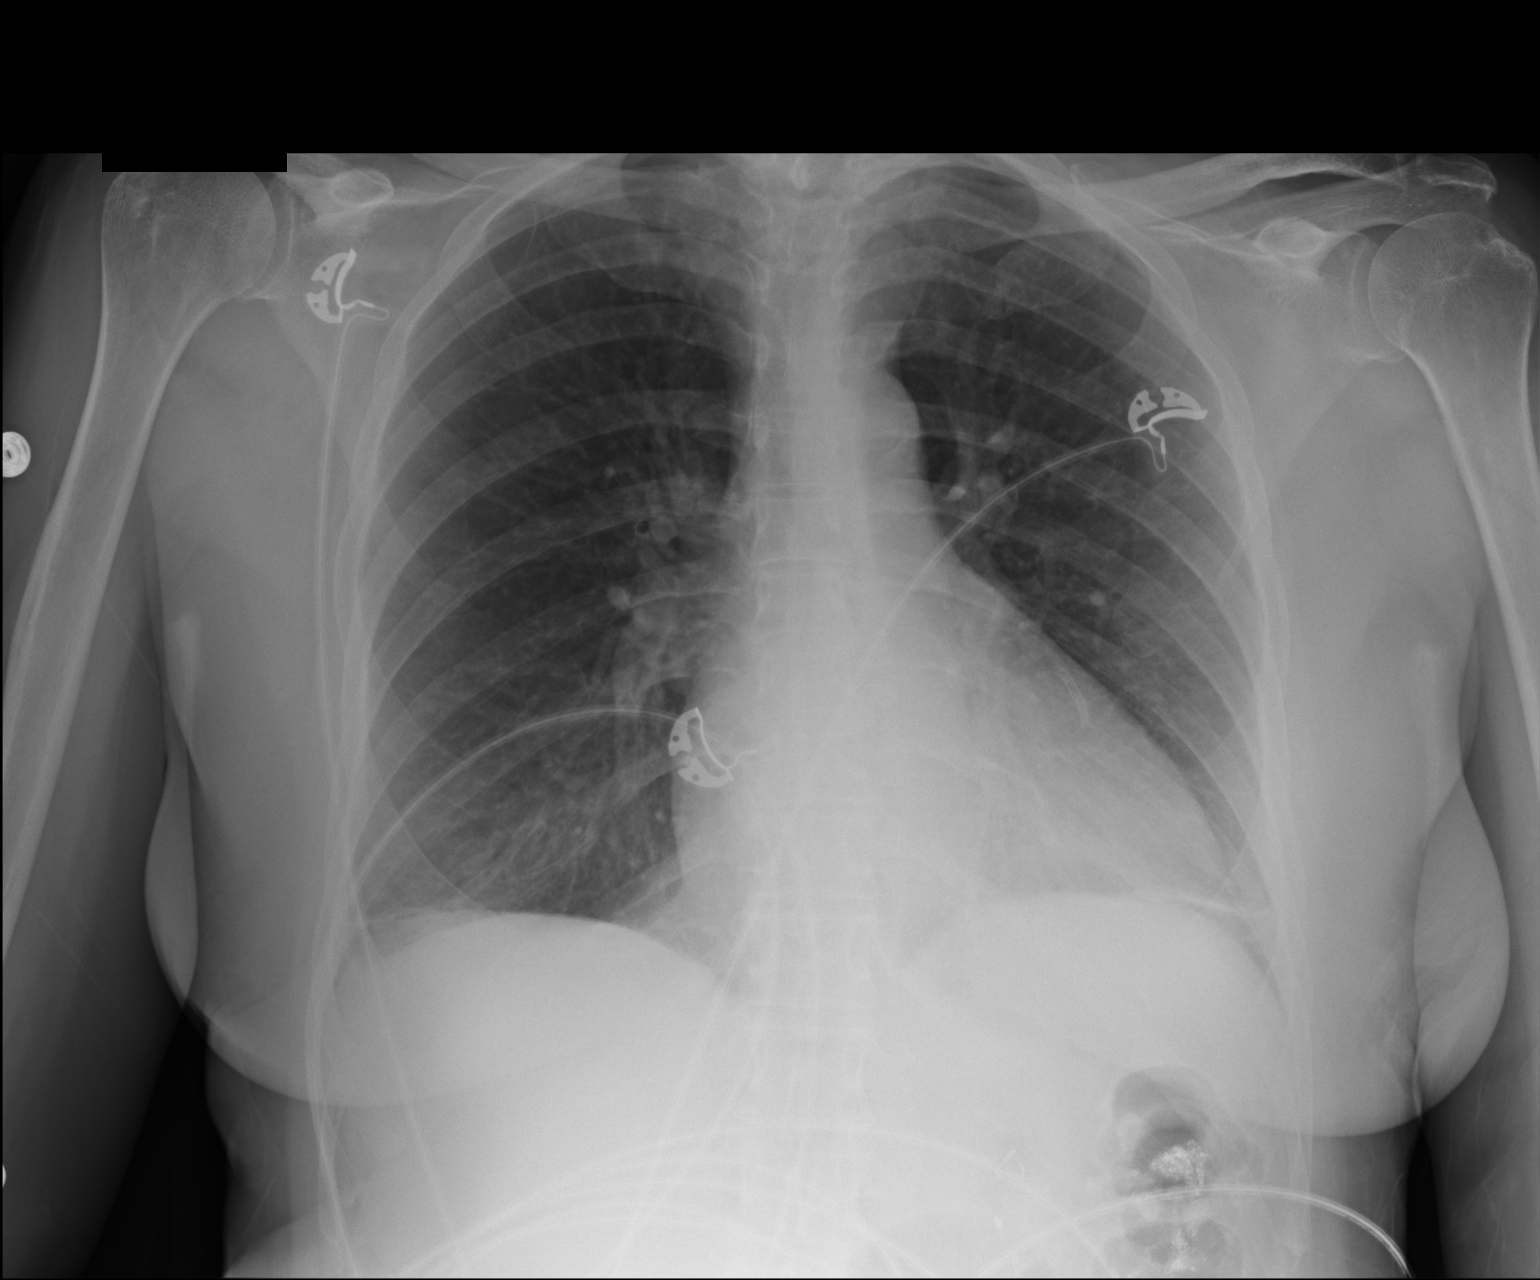

[1 of 1 positions shown; findings below may reference images not displayed]

FINDINGS: Minimal air space disease lung bases which appears
represent atelectasis although a subtle infiltrate not entirely
excluded in the proper clinical setting.

Central pulmonary vascular prominence without pulmonary edema.  No
pneumothorax.

Cardiomegaly.  Mildly tortuous aorta.
IMPRESSION: Bibasilar parenchymal changes suggestive of atelectasis.
Infiltrate not excluded in the proper clinical setting.  No
significant change.

## 2012-01-21 MED ORDER — HYDROCORTISONE 1 % EX CREA
TOPICAL_CREAM | Freq: Three times a day (TID) | CUTANEOUS | Status: DC | PRN
  Administered 2012-01-22 (×2): 1 via TOPICAL
  Filled 2012-01-21: qty 28

## 2012-01-21 MED ORDER — HYDROCORTISONE 1 % EX LOTN
1.0000 "application " | TOPICAL_LOTION | Freq: Three times a day (TID) | CUTANEOUS | Status: DC | PRN
  Filled 2012-01-21: qty 118

## 2012-01-21 MED ORDER — PHENOL 1.4 % MT LIQD
1.0000 | OROMUCOSAL | Status: DC | PRN
  Administered 2012-01-21: 1 via OROMUCOSAL
  Filled 2012-01-21: qty 177

## 2012-01-21 NOTE — Progress Notes (Signed)
CARDIAC REHAB PHASE I   PRE:  Rate/Rhythm: 102ST  BP:  Supine: 165/68  Sitting:   Standing:    SaO2: 98%RA  MODE:  Ambulation: 350 ft   POST:  Rate/Rhythem: 133ST  BP:  Supine: 110/89  Sitting:   Standing:    SaO2: 99%RA 1000-1020 Pt tired and not really wanting to walk. Got back from dialysis early this am. Walked 350 ft on RA with rolling walker and asst x 1. Tolerated well. HR up to 133 but no SOB noted. BP to 110/89. Denied dizziness. Back to bed after walk.  Jeani Sow

## 2012-01-21 NOTE — Progress Notes (Signed)
[facility] Progress Note  Subjective: Wants more food. No recurrent GI bleeding or bowel movements.  Objective:  Vital signs in last 24 hours: Temp:  [98 F (36.7 C)-99.3 F (37.4 C)] 99.3 F (37.4 C) (07/13 0744) Pulse Rate:  [80-96] 91  (07/13 0320) Resp:  [10-33] 16  (07/13 0744) BP: (115-221)/(60-102) 149/63 mmHg (07/13 0441) SpO2:  [95 %-100 %] 100 % (07/13 0441) Weight:  [136 lb 11 oz (62 kg)] 136 lb 11 oz (62 kg) (07/13 0320) Last BM Date: 01/19/12 General:   Alert,  Well-developed,  white female in NAD Heart:  Regular rate and rhythm; no murmurs Abdomen:  Soft, nontender and nondistended. Normal bowel sounds, without guarding, and without rebound.   Extremities:  Without edema. Neurologic:  Alert and  oriented x4;  grossly normal neurologically. Psych:  Alert and cooperative. Normal mood and affect.  Intake/Output from previous day: 07/12 0701 - 07/13 0700 In: 865 [P.O.:180; I.V.:685] Out: 1600  Intake/Output this shift:    Lab Results:  Basename 01/21/12 0600 01/20/12 2321 01/20/12 0535  WBC 15.2* 17.9* 16.3*  HGB 7.6* 7.7* 8.1*  HCT 23.3* 23.2* 25.2*  PLT 384 422* 431*   BMET  Basename 01/21/12 0600 01/20/12 2321 01/20/12 0535  NA 139 131* 134*  K 3.6 3.6 3.9  CL 99 89* 91*  CO2 27 21 22   GLUCOSE 114* 107* 129*  BUN 11 60* 50*  CREATININE 2.98* 9.04* 7.41*  CALCIUM 8.7 9.2 9.6   LFT  Basename 01/20/12 2321 01/18/12 1140  PROT -- 6.5  ALBUMIN 3.1* --  AST -- 18  ALT -- 16  ALKPHOS -- 58  BILITOT -- 0.2*  BILIDIR -- --  IBILI -- --    Assessment / Plan:  1. Bleeding duodenal AVM, no recurrent bleeding post endoscopic therapy. Continue IV PPI infusion. Advance diet. 2. Anemia, multifactorial with a component secondary to acute blood loss. Hemoglobin has fallen to 7.6. I recommend transfusing to Hb 9 of greater in case of re bleed and for cardiovascular perfusion. Given ESRD and volume concerns will defer to Cardiology and Renal  services to decide on transfusion and timing.  3. CAD. NSTEMI s/p DES.   4. CHF 5. ESRD on HD   Principal Problem:  *CAD (coronary artery disease) Active Problems:  HTN (hypertension)  Hyperlipidemia  ESRD (end stage renal disease)  Acute systolic CHF (congestive heart failure)  Acute respiratory failure  Acute MI  GERD (gastroesophageal reflux disease)  Hemorrhage of gastrointestinal tract, unspecified  Acute posthemorrhagic anemia  Angiodysplasia of stomach and duodenum with hemorrhage   LOS: 12 days   Alanna Storti T. Fuller Plan MD [facility]  01/21/2012, 11:25 AM

## 2012-01-21 NOTE — Progress Notes (Signed)
Report from Night RN. Chart reviewed together. Handoff complete.

## 2012-01-21 NOTE — Progress Notes (Signed)
Subjective: Interval History: none.  Objective: Vital signs in last 24 hours: Temp:  [98 F (36.7 C)-99.3 F (37.4 C)] 99.3 F (37.4 C) (07/13 0744) Pulse Rate:  [80-96] 91  (07/13 0320) Resp:  [10-33] 16  (07/13 0744) BP: (115-221)/(60-102) 149/63 mmHg (07/13 0441) SpO2:  [95 %-100 %] 100 % (07/13 0441) Weight:  [62 kg (136 lb 11 oz)] 62 kg (136 lb 11 oz) (07/13 0320) Weight change: 4.8 kg (10 lb 9.3 oz)  Intake/Output from previous day: 07/12 0701 - 07/13 0700 In: 865 [P.O.:180; I.V.:685] Out: 1600  Intake/Output this shift:    General appearance: alert, cooperative and appears stated age Resp: clear to auscultation bilaterally Cardio: S1, S2 normal and systolic murmur: holosystolic 2/6, blowing throughout the precordium GI: soft, pos bs, liver down 3 cm Extremities: AVF LUA B&T  Lab Results:  Basename 01/21/12 0600 01/20/12 2321  WBC 15.2* 17.9*  HGB 7.6* 7.7*  HCT 23.3* 23.2*  PLT 384 422*   BMET:  Basename 01/21/12 0600 01/20/12 2321  NA 139 131*  K 3.6 3.6  CL 99 89*  CO2 27 21  GLUCOSE 114* 107*  BUN 11 60*  CREATININE 2.98* 9.04*  CALCIUM 8.7 9.2   No results found for this basename: PTH:2 in the last 72 hours Iron Studies: No results found for this basename: IRON,TIBC,TRANSFERRIN,FERRITIN in the last 72 hours  Studies/Results: No results found.  I have reviewed the patient's current medications.  Assessment/Plan: 1 CRF did will on HD 2 GIB Hb drifting down, follow 3 Anemia epo/fe 4 Cad per cards 5 HTN better with slow UF    LOS: 12 days   Jeneane Pieczynski L 01/21/2012,9:03 AM

## 2012-01-21 NOTE — Progress Notes (Signed)
RN called. Patient complaining of non-productive cough and sore throat. No fever. WBC has been elevated. Get CXR, rapid strep screen. May use Robitussin prn and chloraseptic prn. Richardson Dopp, PA-C  4:00 PM 01/21/2012

## 2012-01-21 NOTE — Progress Notes (Signed)
PROGRESS NOTE  Subjective:   Pt is a 71 yo with CAD - s/p stenting 12/15/11 in 1st Diag and 1st OM.Marland Kitchen  She developed a rash to plavix, was changed to Brilinta but then failed to get her Brilinta.  She presented 7/2 with NSTEMI and had a stent placed in the ostial RCA  She has a hx of CKD and is on dialysis. She had GI bleed yesterday and had EGD which revealed a bleeding AVM.  The AVM was injected with epi and was cauterized.  Objective:    Vital Signs:   Temp:  [98 F (36.7 C)-99.3 F (37.4 C)] 99.3 F (37.4 C) (07/13 0744) Pulse Rate:  [80-96] 91  (07/13 0320) Resp:  [10-33] 16  (07/13 0744) BP: (115-221)/(60-102) 149/63 mmHg (07/13 0441) SpO2:  [95 %-100 %] 100 % (07/13 0441) Weight:  [136 lb 11 oz (62 kg)] 136 lb 11 oz (62 kg) (07/13 0320)  Last BM Date: 01/19/12   24-hour weight change: Weight change: 10 lb 9.3 oz (4.8 kg)  Weight trends: Filed Weights   01/19/12 0600 01/20/12 0500 01/21/12 0320  Weight: 127 lb 10.3 oz (57.9 kg) 126 lb 1.7 oz (57.2 kg) 136 lb 11 oz (62 kg)    Intake/Output:  07/12 0701 - 07/13 0700 In: 865 [P.O.:180; I.V.:685] Out: 1600      Physical Exam: BP 149/63  Pulse 91  Temp 99.3 F (37.4 C) (Oral)  Resp 16  Ht 5\' 2"  (1.575 m)  Wt 136 lb 11 oz (62 kg)  BMI 25.00 kg/m2  SpO2 100%  General: Vital signs reviewed and noted. Well-developed, well-nourished, in no acute distress; alert, appropriate and cooperative .  Head: Normocephalic, atraumatic.  Eyes: conjunctivae/corneas clear.  EOM's intact.   Throat: normal  Neck: Supple. Normal carotids. No JVD  Lungs:  Clear to auscultation  Heart: Regular rate,  With normal  S1 S2. Tachy.  Abdomen:  Soft, non-tender, non-distended with normoactive bowel sounds. No hepatomegaly. No rebound/guarding. No abdominal masses.  Extremities: Distal pedal pulses are 2+ .  No edema.  Dialysis fistula in left upper arm  Neurologic: A&O X3, CN II - XII are grossly intact. Motor strength is 5/5 in the all  4 extremities.  Psych: Responds to questions appropriately with normal affect.    Labs: BMET:  Basename 01/21/12 0600 01/20/12 2321 01/18/12 1140  NA 139 131* --  K 3.6 3.6 --  CL 99 89* --  CO2 27 21 --  GLUCOSE 114* 107* --  BUN 11 60* --  CREATININE 2.98* 9.04* --  CALCIUM 8.7 9.2 --  MG -- -- --  PHOS -- 4.2 3.2    Liver function tests:  Basename 01/20/12 2321 01/18/12 1140  AST -- 18  ALT -- 16  ALKPHOS -- 58  BILITOT -- 0.2*  PROT -- 6.5  ALBUMIN 3.1* 3.2*   No results found for this basename: LIPASE:2,AMYLASE:2 in the last 72 hours  CBC:  Basename 01/21/12 0600 01/20/12 2321  WBC 15.2* 17.9*  NEUTROABS -- --  HGB 7.6* 7.7*  HCT 23.3* 23.2*  MCV 89.6 88.9  PLT 384 422*    Cardiac Enzymes: No results found for this basename: CKTOTAL:4,CKMB:4,TROPONINI:4 in the last 72 hours  Coagulation Studies: No results found for this basename: LABPROT:5,INR:5 in the last 72 hours   Tele:  Sinus tach  Medications:    Infusions:    . sodium chloride    . pantoprozole (PROTONIX) infusion 8 mg/hr (01/20/12 1954)  .  DISCONTD: sodium chloride      Scheduled Medications:    . allopurinol  300 mg Oral Daily  . amLODipine  10 mg Oral QHS  . aspirin  81 mg Oral Daily  . atorvastatin  20 mg Oral q1800  . darbepoetin (ARANESP) injection - DIALYSIS  150 mcg Intravenous Q Mon-HD  . ferric gluconate (FERRLECIT/NULECIT) IV  125 mg Intravenous Q M,W,F-HD  . fluticasone  2 spray Each Nare Daily  . guaiFENesin  600 mg Oral BID  . heparin  40 Units/kg Dialysis Once in dialysis  . lanthanum  1,000 mg Oral TID WC  . loratadine  10 mg Oral Daily  . metoprolol tartrate  25 mg Oral BID  . multivitamin  1 tablet Oral QHS  . polyethylene glycol  17 g Oral Daily  . sodium chloride  3 mL Intravenous Q12H  . Ticagrelor  90 mg Oral BID    Assessment/ Plan:    CAD (coronary artery disease) (12/16/2011) S/p stenting of all 3 vessels at this point.  No angina  HTN  (hypertension) (12/16/2011) Still slightly hypertensive.  Was up all night at dialysis.  Hyperlipidemia (12/16/2011) Continue atorvastatin  ESRD (end stage renal disease) (12/16/2011) Per renal team.  Acute systolic CHF (congestive heart failure) (01/09/2012) Continue current meds   Disposition: continue current meds for now.  Length of Stay: 12  Thayer Headings, Brooke Bonito., MD, [facility] 01/21/2012, 9:49 AM Office [phone] Pager [phone]

## 2012-01-22 LAB — RENAL FUNCTION PANEL
BUN: 23 mg/dL (ref 6–23)
CO2: 25 mEq/L (ref 19–32)
Chloride: 99 mEq/L (ref 96–112)
GFR calc Af Amer: 8 mL/min — ABNORMAL LOW (ref >90)
Glucose, Bld: 98 mg/dL (ref 70–99)
Potassium: 4.2 mEq/L (ref 3.5–5.1)

## 2012-01-22 LAB — CBC
HCT: 23.6 % — ABNORMAL LOW (ref 36.0–46.0)
Hemoglobin: 7.6 g/dL — ABNORMAL LOW (ref 12.0–15.0)
MCH: 29.3 pg (ref 26.0–34.0)
MCHC: 32.2 g/dL (ref 30.0–36.0)
MCV: 91.1 fL (ref 78.0–100.0)
Platelets: 415 10*3/uL — ABNORMAL HIGH (ref 150–400)
RBC: 2.59 MIL/uL — ABNORMAL LOW (ref 3.87–5.11)
RDW: 16 % — ABNORMAL HIGH (ref 11.5–15.5)
WBC: 12.6 10*3/uL — ABNORMAL HIGH (ref 4.0–10.5)

## 2012-01-22 MED ORDER — LIDOCAINE-PRILOCAINE 2.5-2.5 % EX CREA: 1.0000 "application " | TOPICAL_CREAM | CUTANEOUS | Status: DC | PRN

## 2012-01-22 MED ORDER — HEPARIN SODIUM (PORCINE) 1000 UNIT/ML DIALYSIS
1000.0000 [IU] | INTRAMUSCULAR | Status: DC | PRN
  Filled 2012-01-22: qty 1

## 2012-01-22 MED ORDER — SODIUM CHLORIDE 0.9 % IV SOLN: 100.0000 mL | INTRAVENOUS | Status: DC | PRN

## 2012-01-22 MED ORDER — PANTOPRAZOLE SODIUM 40 MG PO TBEC
40.0000 mg | DELAYED_RELEASE_TABLET | Freq: Two times a day (BID) | ORAL | Status: DC
  Administered 2012-01-22 – 2012-01-25 (×7): 40 mg via ORAL
  Filled 2012-01-22 (×7): qty 1

## 2012-01-22 MED ORDER — HEPARIN SODIUM (PORCINE) 1000 UNIT/ML DIALYSIS
40.0000 [IU]/kg | Freq: Once | INTRAMUSCULAR | Status: AC
  Administered 2012-01-23: 2500 [IU] via INTRAVENOUS_CENTRAL

## 2012-01-22 MED ORDER — NEPRO/CARBSTEADY PO LIQD: 237.0000 mL | ORAL | Status: DC | PRN

## 2012-01-22 MED ORDER — PENTAFLUOROPROP-TETRAFLUOROETH EX AERO: 1.0000 "application " | INHALATION_SPRAY | CUTANEOUS | Status: DC | PRN

## 2012-01-22 MED ORDER — LIDOCAINE HCL (PF) 1 % IJ SOLN: 5.0000 mL | INTRAMUSCULAR | Status: DC | PRN

## 2012-01-22 MED ORDER — SIMETHICONE 40 MG/0.6ML PO SUSP
40.0000 mg | Freq: Four times a day (QID) | ORAL | Status: DC | PRN
  Filled 2012-01-22: qty 0.6

## 2012-01-22 MED ORDER — ALTEPLASE 2 MG IJ SOLR
2.0000 mg | Freq: Once | INTRAMUSCULAR | Status: AC | PRN
  Filled 2012-01-22: qty 2

## 2012-01-22 NOTE — Progress Notes (Signed)
Subjective: Interval History: has complaints hoarse.  Objective: Vital signs in last 24 hours: Temp:  [98.3 F (36.8 C)-99.5 F (37.5 C)] 98.6 F (37 C) (07/14 0800) Pulse Rate:  [96] 96  (07/13 1100) Resp:  [16-18] 16  (07/14 0800) BP: (156-181)/(57-84) 165/84 mmHg (07/14 0340) SpO2:  [96 %-100 %] 97 % (07/14 0340) Weight:  [63.7 kg (140 lb 6.9 oz)] 63.7 kg (140 lb 6.9 oz) (07/14 0340) Weight change: 1.7 kg (3 lb 12 oz)  Intake/Output from previous day: 07/13 0701 - 07/14 0700 In: 660 [P.O.:60; I.V.:600] Out: -  Intake/Output this shift:    General appearance: alert, cooperative and appears stated age Resp: clear to auscultation bilaterally Cardio: S1, S2 normal and systolic murmur: systolic ejection 2/6, decrescendo at 2nd left intercostal space GI: pos bs, soft, liver down 3 cm Extremities: AVF LUA B&T  Lab Results:  Basename 01/22/12 0344 01/21/12 0600  WBC 12.6* 15.2*  HGB 7.6* 7.6*  HCT 23.6* 23.3*  PLT 415* 384   BMET:  Basename 01/22/12 0344 01/21/12 0600  NA 138 139  K 4.2 3.6  CL 99 99  CO2 25 27  GLUCOSE 98 114*  BUN 23 11  CREATININE 5.53* 2.98*  CALCIUM 9.1 8.7   No results found for this basename: PTH:2 in the last 72 hours Iron Studies: No results found for this basename: IRON,TIBC,TRANSFERRIN,FERRITIN in the last 72 hours  Studies/Results: Dg Chest Port 1 View  01/21/2012  *RADIOLOGY REPORT*  Clinical Data: Cough.  PORTABLE CHEST - 1 VIEW  Comparison: 01/16/2012 CT.  01/15/2012 chest x-ray and 06/19/2011 chest x-ray.  Findings: Minimal air space disease lung bases which appears represent atelectasis although a subtle infiltrate not entirely excluded in the proper clinical setting.  Central pulmonary vascular prominence without pulmonary edema.  No pneumothorax.  Cardiomegaly.  Mildly tortuous aorta.  IMPRESSION: Bibasilar parenchymal changes suggestive of atelectasis. Infiltrate not excluded in the proper clinical setting.  No significant change.   Original Report Authenticated By: Doug Sou, M.D.    I have reviewed the patient's current medications.  Assessment/Plan: 1 CRF for HD in am ..If needs tx, give @ HD 2 BP better with longer slow UF 3 CAD 4 GIB per GI, Hb stable 5 Anemia epo/fe 6 HPTH 7 Thrush mycelex  P HD, tx, epo/fe    LOS: 13 days   Mandeep Kiser L 01/22/2012,10:24 AM

## 2012-01-22 NOTE — Progress Notes (Signed)
PROGRESS NOTE  Subjective:   Pt is a 71 yo with CAD - s/p stenting 12/15/11 in 1st Diag and 1st OM.Marland Kitchen  She developed a rash to plavix, was changed to Brilinta but then failed to get her Brilinta.  She presented 7/2 with NSTEMI and had a stent placed in the ostial RCA  She has a hx of CKD and is on dialysis. She had GI bleed yesterday and had EGD which revealed a bleeding AVM.  The AVM was injected with epi and was cauterized.   Objective:    Vital Signs:   Temp:  [98.3 F (36.8 C)-99.5 F (37.5 C)] 98.6 F (37 C) (07/14 0800) Pulse Rate:  [96-102] 96  (07/13 1100) Resp:  [16-18] 16  (07/14 0800) BP: (156-181)/(57-84) 165/84 mmHg (07/14 0340) SpO2:  [96 %-100 %] 97 % (07/14 0340) Weight:  [140 lb 6.9 oz (63.7 kg)] 140 lb 6.9 oz (63.7 kg) (07/14 0340)  Last BM Date: 01/19/12   24-hour weight change: Weight change: 3 lb 12 oz (1.7 kg)  Weight trends: Filed Weights   01/20/12 0500 01/21/12 0320 01/22/12 0340  Weight: 126 lb 1.7 oz (57.2 kg) 136 lb 11 oz (62 kg) 140 lb 6.9 oz (63.7 kg)    Intake/Output:  07/13 0701 - 07/14 0700 In: 660 [P.O.:60; I.V.:600] Out: -      Physical Exam: BP 165/84  Pulse 96  Temp 98.6 F (37 C) (Oral)  Resp 16  Ht 5\' 2"  (1.575 m)  Wt 140 lb 6.9 oz (63.7 kg)  BMI 25.69 kg/m2  SpO2 97%  General: Vital signs reviewed and noted. Well-developed, well-nourished, in no acute distress; alert, appropriate and cooperative . Coughing some  Head: Normocephalic, atraumatic.  Eyes: conjunctivae/corneas clear.  EOM's intact.   Throat: normal  Neck: Supple. Normal carotids. No JVD  Lungs:  Clear to auscultation  Heart: Regular rate,  With normal  S1 S2. Tachy.  Abdomen:  Soft, non-tender, non-distended with normoactive bowel sounds. No hepatomegaly. No rebound/guarding. No abdominal masses.  Extremities: Distal pedal pulses are 2+ .  No edema.  Dialysis fistula in left upper arm  Neurologic: A&O X3, CN II - XII are grossly intact. Motor strength is  5/5 in the all 4 extremities.  Psych: Responds to questions appropriately with normal affect.    Labs: BMET:  Basename 01/22/12 0344 01/21/12 0600 01/20/12 2321  NA 138 139 --  K 4.2 3.6 --  CL 99 99 --  CO2 25 27 --  GLUCOSE 98 114* --  BUN 23 11 --  CREATININE 5.53* 2.98* --  CALCIUM 9.1 8.7 --  MG -- -- --  PHOS 3.2 -- 4.2    Liver function tests:  Basename 01/22/12 0344 01/20/12 2321  AST -- --  ALT -- --  ALKPHOS -- --  BILITOT -- --  PROT -- --  ALBUMIN 2.9* 3.1*   No results found for this basename: LIPASE:2,AMYLASE:2 in the last 72 hours  CBC:  Basename 01/22/12 0344 01/21/12 0600  WBC 12.6* 15.2*  NEUTROABS -- --  HGB 7.6* 7.6*  HCT 23.6* 23.3*  MCV 91.1 89.6  PLT 415* 384    Cardiac Enzymes: No results found for this basename: CKTOTAL:4,CKMB:4,TROPONINI:4 in the last 72 hours  Coagulation Studies: No results found for this basename: LABPROT:5,INR:5 in the last 72 hours   Tele:  Sinus tach  Medications:    Infusions:    . sodium chloride    . pantoprozole (PROTONIX) infusion 8 mg/hr (  01/22/12 0733)    Scheduled Medications:    . allopurinol  300 mg Oral Daily  . amLODipine  10 mg Oral QHS  . aspirin  81 mg Oral Daily  . atorvastatin  20 mg Oral q1800  . darbepoetin (ARANESP) injection - DIALYSIS  150 mcg Intravenous Q Mon-HD  . ferric gluconate (FERRLECIT/NULECIT) IV  125 mg Intravenous Q M,W,F-HD  . fluticasone  2 spray Each Nare Daily  . guaiFENesin  600 mg Oral BID  . heparin  40 Units/kg Dialysis Once in dialysis  . lanthanum  1,000 mg Oral TID WC  . loratadine  10 mg Oral Daily  . metoprolol tartrate  25 mg Oral BID  . multivitamin  1 tablet Oral QHS  . polyethylene glycol  17 g Oral Daily  . sodium chloride  3 mL Intravenous Q12H  . Ticagrelor  90 mg Oral BID    Assessment/ Plan:    CAD (coronary artery disease) (12/16/2011) S/p stenting of all 3 vessels at this point.  No angina  HTN (hypertension) (12/16/2011) Still  slightly hypertensive.  Was up all night at dialysis.  Hyperlipidemia (12/16/2011) Continue atorvastatin   ESRD (end stage renal disease) (12/16/2011) Per renal team.  Acute systolic CHF (congestive heart failure) (01/09/2012) Continue current meds  Anemia:  Will write for a transfusion.  I think she should receive it during dialysis tomorrow.       Disposition: continue current meds for now.  Length of Stay: 39  Thayer Headings, Brooke Bonito., MD, [facility] 01/22/2012, 9:32 AM Office [phone] Pager [phone]

## 2012-01-22 NOTE — Progress Notes (Signed)
Notified Md. Pt c/o SOB with chest tightness. Stated " feels like too much fluid".  Ekg obtained  With no change noted.  Placed on 2L o2 sats 100 %. Lungs Clear/dim.  SBP 199 gave PRN medication. Repositioned in bed.  Xanax 0.25 give for anxiety.  Will continue to monitor. Pt resting and call bell at bedside.

## 2012-01-22 NOTE — Progress Notes (Signed)
[facility] Progress Note  Subjective: Feels much better. No bowel movements. No bleeding. Wants more food.  Objective:  Vital signs in last 24 hours: Temp:  [98.3 F (36.8 C)-99.5 F (37.5 C)] 98.6 F (37 C) (07/14 0800) Pulse Rate:  [96] 96  (07/13 1100) Resp:  [16-18] 16  (07/14 0800) BP: (156-181)/(57-84) 165/84 mmHg (07/14 0340) SpO2:  [96 %-100 %] 97 % (07/14 0340) Weight:  [140 lb 6.9 oz (63.7 kg)] 140 lb 6.9 oz (63.7 kg) (07/14 0340) Last BM Date: 01/19/12 General:   Alert, well-developed, female in NAD Heart:  Regular rate and rhythm; no murmurs Abdomen:  Soft, nontender and nondistended. Normal bowel sounds, without guarding, and without rebound.   Extremities:  Without edema. Neurologic:  Alert and  oriented x4;  grossly normal neurologically. Psych:  Alert and cooperative. Normal mood and affect.  Intake/Output from previous day: 07/13 0701 - 07/14 0700 In: 660 [P.O.:60; I.V.:600] Out: -  Intake/Output this shift:    Lab Results:  Basename 01/22/12 0344 01/21/12 0600 01/20/12 2321  WBC 12.6* 15.2* 17.9*  HGB 7.6* 7.6* 7.7*  HCT 23.6* 23.3* 23.2*  PLT 415* 384 422*   BMET  Basename 01/22/12 0344 01/21/12 0600 01/20/12 2321  NA 138 139 131*  K 4.2 3.6 3.6  CL 99 99 89*  CO2 25 27 21   GLUCOSE 98 114* 107*  BUN 23 11 60*  CREATININE 5.53* 2.98* 9.04*  CALCIUM 9.1 8.7 9.2   LFT  Basename 01/22/12 0344  PROT --  ALBUMIN 2.9*  AST --  ALT --  ALKPHOS --  BILITOT --  BILIDIR --  IBILI --   Studies/Results: Dg Chest Port 1 View  01/21/2012  *RADIOLOGY REPORT*  Clinical Data: Cough.  PORTABLE CHEST - 1 VIEW  Comparison: 01/16/2012 CT.  01/15/2012 chest x-ray and 06/19/2011 chest x-ray.  Findings: Minimal air space disease lung bases which appears represent atelectasis although a subtle infiltrate not entirely excluded in the proper clinical setting.  Central pulmonary vascular prominence without pulmonary edema.  No pneumothorax.   Cardiomegaly.  Mildly tortuous aorta.  IMPRESSION: Bibasilar parenchymal changes suggestive of atelectasis. Infiltrate not excluded in the proper clinical setting.  No significant change.  Original Report Authenticated By: Doug Sou, M.D.    Assessment / Plan:  1. Bleeding duodenal AVM, no recurrent bleeding post endoscopic therapy. PPI BID PO for 2 weeks then PPI qam long term. Advance diet as tolerated. We will sign off. 2. Anemia, multifactorial with a component secondary to acute blood loss. Hemoglobin has fallen to 7.6. I recommend transfusing to Hb 9 of greater in case of re bleed and for cardiovascular perfusion.  3. CAD. NSTEMI s/p DES. Pt remains on ASA/Brilinta. 4. CHF  5. ESRD on HD 6. Consipation and gas. Miralax daily and mylicon as needed.  Principal Problem:  *CAD (coronary artery disease) Active Problems:  HTN (hypertension)  Hyperlipidemia  ESRD (end stage renal disease)  Acute systolic CHF (congestive heart failure)  Acute respiratory failure  Acute MI  GERD (gastroesophageal reflux disease)  Hemorrhage of gastrointestinal tract, unspecified  Acute posthemorrhagic anemia  Angiodysplasia of stomach and duodenum with hemorrhage    LOS: 13 days   Lasonia Casino T. Fuller Plan MD [facility]  01/22/2012, 10:07 AM

## 2012-01-23 ENCOUNTER — Encounter (HOSPITAL_COMMUNITY): Payer: Self-pay | Admitting: Gastroenterology

## 2012-01-23 ENCOUNTER — Inpatient Hospital Stay (HOSPITAL_COMMUNITY): Payer: Medicare Other

## 2012-01-23 LAB — BASIC METABOLIC PANEL
Calcium: 9.3 mg/dL (ref 8.4–10.5)
Creatinine, Ser: 7.94 mg/dL — ABNORMAL HIGH (ref 0.50–1.10)
GFR calc non Af Amer: 5 mL/min — ABNORMAL LOW (ref >90)
Glucose, Bld: 118 mg/dL — ABNORMAL HIGH (ref 70–99)
Sodium: 136 mEq/L (ref 135–145)

## 2012-01-23 LAB — CBC
MCH: 29.7 pg (ref 26.0–34.0)
MCV: 92.7 fL (ref 78.0–100.0)
Platelets: 441 10*3/uL — ABNORMAL HIGH (ref 150–400)
RBC: 2.59 MIL/uL — ABNORMAL LOW (ref 3.87–5.11)
RDW: 16.4 % — ABNORMAL HIGH (ref 11.5–15.5)
WBC: 14.2 10*3/uL — ABNORMAL HIGH (ref 4.0–10.5)

## 2012-01-23 LAB — PHOSPHORUS: Phosphorus: 3.7 mg/dL (ref 2.3–4.6)

## 2012-01-23 MED ORDER — DARBEPOETIN ALFA-POLYSORBATE 150 MCG/0.3ML IJ SOLN
INTRAMUSCULAR | Status: AC
  Administered 2012-01-23: 150 ug
  Filled 2012-01-23: qty 0.3

## 2012-01-23 NOTE — Progress Notes (Signed)
CARDIAC REHAB PHASE I   PRE:  Rate/Rhythm: 106  ST PAC  BP:  Supine:   Sitting: 155/65  Standing:    SaO2: 120  MODE:  Ambulation: 350 ft   POST:  Rate/Rhythem: 120 ST PAC  BP:  Supine:   Sitting: 171/35  Standing:    SaO2: 98 RA 1410-1345 Assisted X 1 and used walker to ambulate. Gait steady with walker. Pt's only c/o is of being tired by end of walk. BP after walk 171/35. Pt to recliner after walk with call light in reach.  Deon Pilling

## 2012-01-23 NOTE — Progress Notes (Signed)
PROGRESS NOTE  Subjective:   Pt is a 71 yo with CAD - s/p stenting 12/15/11 in 1st Diag and 1st OM.Marland Kitchen  She developed a rash to plavix, was changed to Brilinta but then failed to get her Brilinta.  She presented 7/2 with NSTEMI and had a stent placed in the ostial RCA  She has a hx of CKD and is on dialysis. She had recent GI bleed and had EGD which revealed a bleeding AVM.  The AVM was injected with epi and was cauterized.  Hemaglobin remains quite low.  She reports SOB overnight which is improved this am.  Denies CP.  Presently comfortable on dialysis   Objective:    Vital Signs:   Temp:  [98.6 F (37 C)-99.1 F (37.3 C)] 99.1 F (37.3 C) (07/15 0627) Pulse Rate:  [91-100] 100  (07/15 0800) Resp:  [12-14] 14  (07/15 0730) BP: (160-199)/(72-94) 169/79 mmHg (07/15 0800) SpO2:  [96 %-100 %] 100 % (07/15 0700) Weight:  [135 lb 9.3 oz (61.5 kg)-143 lb 8.3 oz (65.1 kg)] 143 lb 8.3 oz (65.1 kg) (07/15 0627)  Last BM Date: 01/19/12   24-hour weight change: Weight change: -4 lb 13.6 oz (-2.2 kg)  Weight trends: Filed Weights   01/22/12 0340 01/23/12 0600 01/23/12 0627  Weight: 140 lb 6.9 oz (63.7 kg) 135 lb 9.3 oz (61.5 kg) 143 lb 8.3 oz (65.1 kg)    Intake/Output:  07/14 0701 - 07/15 0700 In: 120 [P.O.:120] Out: -      Physical Exam: BP 169/79  Pulse 100  Temp 99.1 F (37.3 C) (Oral)  Resp 14  Ht 5\' 2"  (1.575 m)  Wt 143 lb 8.3 oz (65.1 kg)  BMI 26.25 kg/m2  SpO2 100%  General: Vital signs reviewed and noted. Well-developed, well-nourished, in no acute distress; alert, appropriate and cooperative . Presently receiving dialysis  Head: Normocephalic, atraumatic.  Eyes: conjunctivae/corneas clear.  EOM's intact.   Throat: normal  Neck: Supple. Normal carotids. No JVD  Lungs:  Clear to auscultation  Heart: Regular rate and rhythm  Abdomen:  Soft, non-tender, non-distended with normoactive bowel sounds. No hepatomegaly. No rebound/guarding. No abdominal masses.    Extremities: Distal pedal pulses are 2+ .  No edema.  Dialysis fistula in left upper arm  Neurologic: Strength/sensation are in tact  Psych: Responds to questions appropriately with normal affect.    Labs: BMET:  Basename 01/23/12 0709 01/23/12 0400 01/22/12 0344  NA -- 136 138  K -- 4.9 4.2  CL -- 97 99  CO2 -- 23 25  GLUCOSE -- 118* 98  BUN -- 40* 23  CREATININE -- 7.94* 5.53*  CALCIUM -- 9.3 9.1  MG -- -- --  PHOS 3.7 -- 3.2    Liver function tests:  Basename 01/23/12 0709 01/22/12 0344  AST -- --  ALT -- --  ALKPHOS -- --  BILITOT -- --  PROT -- --  ALBUMIN 3.0* 2.9*   No results found for this basename: LIPASE:2,AMYLASE:2 in the last 72 hours  CBC:  Basename 01/23/12 0400 01/22/12 0344  WBC 14.2* 12.6*  NEUTROABS -- --  HGB 7.7* 7.6*  HCT 24.0* 23.6*  MCV 92.7 91.1  PLT 441* 415*     Tele:  Sinus Rhythm  Medications:    Infusions:    . DISCONTD: sodium chloride    . DISCONTD: pantoprozole (PROTONIX) infusion 8 mg/hr (01/22/12 0733)    Scheduled Medications:    . allopurinol  300 mg Oral Daily  .  amLODipine  10 mg Oral QHS  . aspirin  81 mg Oral Daily  . atorvastatin  20 mg Oral q1800  . darbepoetin      . darbepoetin (ARANESP) injection - DIALYSIS  150 mcg Intravenous Q Mon-HD  . ferric gluconate (FERRLECIT/NULECIT) IV  125 mg Intravenous Q M,W,F-HD  . fluticasone  2 spray Each Nare Daily  . guaiFENesin  600 mg Oral BID  . heparin  40 Units/kg Dialysis Once in dialysis  . heparin  40 Units/kg Dialysis Once in dialysis  . lanthanum  1,000 mg Oral TID WC  . loratadine  10 mg Oral Daily  . metoprolol tartrate  25 mg Oral BID  . multivitamin  1 tablet Oral QHS  . pantoprazole  40 mg Oral BID  . polyethylene glycol  17 g Oral Daily  . sodium chloride  3 mL Intravenous Q12H  . Ticagrelor  90 mg Oral BID    Assessment/ Plan:    1. CAD (coronary artery disease) (12/16/2011) S/p stenting of all 3 vessels at this point.  No angina  presently Continue current medicine  2. HTN (hypertension) (12/16/2011) Stable No changes  3. Hyperlipidemia (12/16/2011) Continue atorvastatin   4. ESRD (end stage renal disease) (12/16/2011) Per renal team.  5. Acute systolic CHF (congestive heart failure) (01/09/2012) Continue current meds Volume removal with HD  6. Anemia:   Follow CBC closely s/p treatment of AVM by GI       Disposition: continue current meds for now. I would anticipate transfer to nephrology floor if anemia remains stable today.  Length of Stay: [place] Arnelle Nale,MD

## 2012-01-23 NOTE — Procedures (Signed)
Patient was seen on dialysis and the procedure was supervised. BFR 400 Via LUA AVF BP is 158/69.  Patient appears to be tolerating treatment well after one episode of hypotension to 108/49.

## 2012-01-24 LAB — CBC
MCV: 91.5 fL (ref 78.0–100.0)
Platelets: 395 10*3/uL (ref 150–400)
RDW: 16 % — ABNORMAL HIGH (ref 11.5–15.5)
WBC: 13 10*3/uL — ABNORMAL HIGH (ref 4.0–10.5)

## 2012-01-24 LAB — TYPE AND SCREEN
ABO/RH(D): O POS
Antibody Screen: POSITIVE
Donor AG Type: NEGATIVE
Unit division: 0

## 2012-01-24 NOTE — Progress Notes (Addendum)
CARDIAC REHAB PHASE I   PRE:  Rate/Rhythm: 123 ST PAC  BP:  Supine:   Sitting: 146/70  Standing:    SaO2: 97 RA  MODE:  Ambulation: 460 ft   POST:  Rate/Rhythem: 113  BP:  Supine:   Sitting: 148/74  Standing:    SaO2: 98 RA 0915-1050 Tolerated ambulation well using walker. Gait steady with walker no c/o during walk. Pt states that she is feeling stronger today. Back to side of bed after walk with call light in reach. Reviewed discharge education with pt and husband. Pt agrees to [place]. CRP in [place], will send referral.Would recommend rolling walker for pt to use at home, she does not have one.  Deon Pilling

## 2012-01-24 NOTE — Progress Notes (Signed)
Patient ID: Shawna [name], female   DOB: 09-Apr-1941, 71 y.o.   MRN: [id number]  [facility] Progress Note    Subjective:   Feels better   Objective:   BP 118/68  Pulse 100  Temp 98.5 F (36.9 C) (Oral)  Resp 18  Ht 5\' 2"  (1.575 m)  Wt 60.51 kg (133 lb 6.4 oz)  BMI 24.40 kg/m2  SpO2 96%  Physical Exam: Gen:WD WN AAF in NAD CVS:no rub Resp:CTA [id number] Ext:no edema, LUE AVF +T/B  Labs: BMET  Lab 01/23/12 0709 01/23/12 0400 01/22/12 0344 01/21/12 0600 01/20/12 2321 01/20/12 0535 01/19/12 0620 01/18/12 1140  NA -- 136 138 139 131* 134* 137 136  K -- 4.9 4.2 3.6 3.6 3.9 3.9 4.0  CL -- 97 99 99 89* 91* 94* 92*  CO2 -- 23 25 27 21 22 27 26   GLUCOSE -- 118* 98 114* 107* 129* 111* 176*  BUN -- 40* 23 11 60* 50* 32* 71*  CREATININE -- 7.94* 5.53* 2.98* 9.04* 7.41* 4.74* 8.98*  ALBUMIN 3.0* -- 2.9* -- 3.1* -- -- 3.2*  CALCIUM -- 9.3 9.1 8.7 9.2 9.6 9.2 9.8  PHOS 3.7 -- 3.2 -- 4.2 -- -- 3.2   CBC  Lab 01/24/12 0545 01/23/12 0400 01/22/12 0344 01/21/12 0600  WBC 13.0* 14.2* 12.6* 15.2*  NEUTROABS -- -- -- --  HGB 9.8* 7.7* 7.6* 7.6*  HCT 30.3* 24.0* 23.6* 23.3*  MCV 91.5 92.7 91.1 89.6  PLT 395 441* 415* 384    @IMGRELPRIORS @ Medications:      . allopurinol  300 mg Oral Daily  . amLODipine  10 mg Oral QHS  . aspirin  81 mg Oral Daily  . atorvastatin  20 mg Oral q1800  . darbepoetin (ARANESP) injection - DIALYSIS  150 mcg Intravenous Q Mon-HD  . ferric gluconate (FERRLECIT/NULECIT) IV  125 mg Intravenous Q M,W,F-HD  . fluticasone  2 spray Each Nare Daily  . guaiFENesin  600 mg Oral BID  . heparin  40 Units/kg Dialysis Once in dialysis  . lanthanum  1,000 mg Oral TID WC  . loratadine  10 mg Oral Daily  . metoprolol tartrate  25 mg Oral BID  . multivitamin  1 tablet Oral QHS  . pantoprazole  40 mg Oral BID  . polyethylene glycol  17 g Oral Daily  . sodium chloride  3 mL Intravenous Q12H  . Ticagrelor  90 mg Oral BID     Assessment/ Plan:     1. CAD/CP s/p stent, doing well 2. GIB- s/p blood transfusion 3. ESRD cont with HD qMWF but with lower EDW (60.5kg) and longer time on HD for 4 hours q tx 4. Anemia:on epo and s/p blood tx 5. Nutrition:stable 6. Hypertension:stable 7. dispo- for discharge soon per Cards and outpt cardiac rehab in [place], [place] A 01/24/2012, 12:42 PM

## 2012-01-24 NOTE — Progress Notes (Signed)
PROGRESS NOTE  Subjective:   Pt is a 71 yo with CAD - s/p stenting 12/15/11 in 1st Diag and 1st OM.Marland Kitchen  She developed a rash to plavix, was changed to Brilinta but then failed to get her Brilinta.  She presented 7/2 with NSTEMI and had a stent placed in the ostial RCA  She has a hx of CKD and is on dialysis. She had recent GI bleed and had EGD which revealed a bleeding AVM.  The AVM was injected with epi and was cauterized.  Hemaglobin remains quite low.  She is doing much better today.  Denies CP or SOB.  Ambulating in the halls and hopes to go home soon.   Objective:    Vital Signs:   Temp:  [98 F (36.7 C)-99.4 F (37.4 C)] 98 F (36.7 C) (07/16 1400) Pulse Rate:  [79-100] 89  (07/16 1400) Resp:  [17-18] 18  (07/16 1400) BP: (118-183)/(52-79) 142/72 mmHg (07/16 1400) SpO2:  [96 %-98 %] 98 % (07/16 1400) Weight:  [133 lb 6.4 oz (60.51 kg)] 133 lb 6.4 oz (60.51 kg) (07/15 2117)  Last BM Date: 01/20/12   24-hour weight change: Weight change: 4 lb 6.5 oz (2 kg)  Weight trends: Filed Weights   01/23/12 0627 01/23/12 1048 01/23/12 2117  Weight: 143 lb 8.3 oz (65.1 kg) 139 lb 15.9 oz (63.5 kg) 133 lb 6.4 oz (60.51 kg)    Intake/Output:  07/15 0701 - 07/16 0700 In: 470 [P.O.:120; Blood:350] Out: 2011 [Urine:150] Total I/O In: 480 [P.O.:480] Out: 400 [Urine:400]   Physical Exam: BP 142/72  Pulse 89  Temp 98 F (36.7 C) (Oral)  Resp 18  Ht 5\' 2"  (1.575 m)  Wt 133 lb 6.4 oz (60.51 kg)  BMI 24.40 kg/m2  SpO2 98%  General: Vital signs reviewed and noted. Well-developed, well-nourished, in no acute distress; alert, appropriate and cooperative .   Head: Normocephalic, atraumatic.  Eyes: conjunctivae/corneas clear.  EOM's intact.   Throat: normal  Neck: Supple. Normal carotids. No JVD  Lungs:  Clear to auscultation  Heart: Regular rate and rhythm  Abdomen:  Soft, non-tender, non-distended with normoactive bowel sounds. No hepatomegaly. No rebound/guarding. No abdominal  masses.  Extremities: Distal pedal pulses are 2+ .  No edema.  Dialysis fistula in left upper arm  Neurologic: Strength/sensation are in tact  Psych: Responds to questions appropriately with normal affect.    Labs: BMET:  Basename 01/23/12 0709 01/23/12 0400 01/22/12 0344  NA -- 136 138  K -- 4.9 4.2  CL -- 97 99  CO2 -- 23 25  GLUCOSE -- 118* 98  BUN -- 40* 23  CREATININE -- 7.94* 5.53*  CALCIUM -- 9.3 9.1  MG -- -- --  PHOS 3.7 -- 3.2    Liver function tests:  Basename 01/23/12 0709 01/22/12 0344  AST -- --  ALT -- --  ALKPHOS -- --  BILITOT -- --  PROT -- --  ALBUMIN 3.0* 2.9*   No results found for this basename: LIPASE:2,AMYLASE:2 in the last 72 hours  CBC:  Basename 01/24/12 0545 01/23/12 0400  WBC 13.0* 14.2*  NEUTROABS -- --  HGB 9.8* 7.7*  HCT 30.3* 24.0*  MCV 91.5 92.7  PLT 395 441*     Tele:  Sinus Rhythm  Medications:    Infusions:    Scheduled Medications:    . allopurinol  300 mg Oral Daily  . amLODipine  10 mg Oral QHS  . aspirin  81 mg Oral Daily  .  atorvastatin  20 mg Oral q1800  . darbepoetin (ARANESP) injection - DIALYSIS  150 mcg Intravenous Q Mon-HD  . ferric gluconate (FERRLECIT/NULECIT) IV  125 mg Intravenous Q M,W,F-HD  . fluticasone  2 spray Each Nare Daily  . guaiFENesin  600 mg Oral BID  . heparin  40 Units/kg Dialysis Once in dialysis  . lanthanum  1,000 mg Oral TID WC  . loratadine  10 mg Oral Daily  . metoprolol tartrate  25 mg Oral BID  . multivitamin  1 tablet Oral QHS  . pantoprazole  40 mg Oral BID  . polyethylene glycol  17 g Oral Daily  . sodium chloride  3 mL Intravenous Q12H  . Ticagrelor  90 mg Oral BID    Assessment/ Plan:    1. CAD (coronary artery disease) (12/16/2011) S/p stenting of all 3 vessels at this point.  No angina presently Continue current medicine  2. HTN (hypertension) (12/16/2011) Stable No changes  3. Hyperlipidemia (12/16/2011) Continue atorvastatin   4. ESRD (end stage renal  disease) (12/16/2011) Per renal team.  5. Acute systolic CHF (congestive heart failure) (01/09/2012) Continue current meds Volume removal with HD  6. Anemia:   Follow CBC closely s/p treatment of AVM by GI       Disposition: continue current meds for now.  I would anticipate discharge to home tomorrow am.  Length of Stay: [place] Shawna Velador,MD

## 2012-01-25 ENCOUNTER — Inpatient Hospital Stay (HOSPITAL_COMMUNITY): Payer: Medicare Other

## 2012-01-25 LAB — CBC
Hemoglobin: 9.5 g/dL — ABNORMAL LOW (ref 12.0–15.0)
MCHC: 32.4 g/dL (ref 30.0–36.0)
RDW: 16 % — ABNORMAL HIGH (ref 11.5–15.5)
WBC: 16.1 10*3/uL — ABNORMAL HIGH (ref 4.0–10.5)

## 2012-01-25 LAB — RENAL FUNCTION PANEL
Albumin: 3.2 g/dL — ABNORMAL LOW (ref 3.5–5.2)
GFR calc Af Amer: 5 mL/min — ABNORMAL LOW (ref >90)
Glucose, Bld: 137 mg/dL — ABNORMAL HIGH (ref 70–99)
Phosphorus: 1.8 mg/dL — ABNORMAL LOW (ref 2.3–4.6)
Potassium: 4.3 mEq/L (ref 3.5–5.1)
Sodium: 135 mEq/L (ref 135–145)

## 2012-01-25 MED ORDER — METOPROLOL TARTRATE 25 MG PO TABS: 50.0000 mg | ORAL_TABLET | Freq: Two times a day (BID) | ORAL | Status: DC

## 2012-01-25 MED ORDER — PANTOPRAZOLE SODIUM 20 MG PO TBEC: 40.0000 mg | DELAYED_RELEASE_TABLET | Freq: Two times a day (BID) | ORAL | Status: DC

## 2012-01-25 NOTE — Procedures (Signed)
Patient was seen on dialysis and the procedure was supervised. BFR 400 Via LUE AVG BP is 173/65.  Patient appears to be tolerating treatment well.  Discussed case with Dr. Rayann Heman.  Mrs. Raudales will require longer dialysis times to prevent rapid changes in BP and allow for fluid removal.  Agree with increasing metoprolol given her CAD.  She will f/u with her outpt dialysis unit in [place], [place].

## 2012-01-25 NOTE — Discharge Summary (Signed)
CARDIOLOGY DISCHARGE SUMMARY    Patient ID: Shawna [name],  MRN: [id number], DOB/AGE: [birth year] 72 y.o.  Admit date: 01/09/2012 Discharge date: 01/25/2012  Primary Care Physician: Shawna Percy, MD Primary Cardiologist: Shawna Polite, MD - [facility] office  Primary Discharge Diagnosis:  1. CAD  - recurrent NSTEMI this admission s/p DES to ostium of RCA (continued patency of prior stents), EF 55-65% by cath (note: patient was discharged on Plavix but developed rash, so was changed to [facility] but she never picked it up)  - NSTEMI s/p DES to the Cx & diagonal 12/15/2011  2. ESRD on HD MWF  3. Acute respiratory failure requiring mechanical ventilation  4. Respiratory alkalosis  5. H/o CHF  - initial question of EF 35-40% by echo at [facility], improved to 55-65% by cath 01/10/12  6. Hemoptysis, transient  - initially felt to be laryngeal bleeding, no lesion or bleeding seen on laryngoscopy 7. Acute upper GI bleed - s/p EGD and epinephrine injection of AVM in descending duodenum  8. Chronic anemia  - s/p 1 U PRBC this admission  9. Pruiritis  - decision was made by team to continue Brilinta and treat with PRN benadryl  10. HTN  Secondary Discharge Diagnoses:  1. Chronic bronchitis  2. History of pneumonia  3. GERD  4. Stomach ulcers  5. H/o GI bleeding  6. Arthritis  7. History of gout  8. Anxiety  9. Ovarian cancer  10. Colon cancer  Procedures This Admission:  1. Left and right heart catheterization with coronary angiogram performed 01/10/2012 Final Conclusions:  - Single vessel obstructive atherosclerotic coronary disease with a critical ostial RCA stenosis.  - Continued patency of the stents in the first diagonal and then the mid left circumflex.  - Normal LV function.  - Normal right heart pressures and left ventricular filling pressures.  - Successful intracoronary stenting of the ostium of the right coronary with a drug-eluting stent.  2.  Esophagogastroduodenoscopy (EGD) performed 01/20/2012 Final conclusions: - AVM in the descending duodenum treated with epinephrine injection  History and Hospital Course:  71 y/o F with hx of CAD with NSTEMI s/p Cx and diagonal several weeks ago, ESRD, HTN, anemia. The patient was placed on ASA and Plavix but developed a rash secondary to Plavix. She called in to the office and was given a prescription for Brilinta. Unfortunately, she never picked up her Brilinta. She presented to [facility] initially 01/08/12 complaining of chest tightness that woke her up in the middle of the night, followed by cough and SOB for 30 minutes. Initial CE's were negative. CXR showed cardiomegaly with central venous congestion. The patient underwent hemodialysis and the plan was to let her go home. However, it was noted the patient was not taking Brilinta. The patient subsequently developed respiratory failure requiring mechanical intubation. She then had a rrise in her troponins to 2.8. EKG showed left ventricular hypertrophy and nonspecific ST-T wave changes, although with some ST-segment depression inferior leads. She was loaded with Brilinta and started on scheduled dosing. Dr. Dannielle [name] notes that her echo at [facility] showed a new wall motion abnormality inferior inferoseptal wall with LV dysfunction, ejection fraction 35-40% (previous ejection fraction 55%). He arranged for transfer to [facility] after talking to Dr. Lia [name]. She was placed on heparin. Due to question of contrast dye allergy, she was medicated and underwent cardiac cath 01/10/12. This revealed single vessel obstructive coronary disease with critical ostial RCA stenosis, continued patency of the stents in the  first diagonal and then the mid left circumflex, normal LV function, normal right heart pressures and left ventricular filling pressures. She underwent successful intracoronary stenting of the ostium of the right coronary with a drug-eluting  stent. The evening post-procedure, she was noted to be hypotensive and was given a small dose of 25% albumin for BP support and was monitored. The days following cath, she was weaned off the vent. Initially there was difficulty due to residual sedation. Renal followed along for dialysis provision and electrolyte management. CCM recommended 1 u PRBC due to Hgb 7.6 (baseline anemic and felt at risk for anemia of acute illness). They also stopped her diflucan. She was able to be extubated 7/72/14. However, at this point in time, she also had hives which was treated with benadryl. Shawna [name] was hesitant to hold Brilinta given her recent MI and recommended PRN benadryl.Shawna [name] started loratidine. On 01/15/12, the patient noted increased work of breathing and coughing up bright red blood. Vitals were stable. Stat CXR showed mild bibasilar atelectasis but no evidence of pulmonary hemorrhage. She denied any prior lung hx of weight loss. PCCM was reconsulted who felt this likely represented a laryngeal bleed. CT of chest was unremarkable. Her hemoptysis resolved, but mucinex was added for residual cough. Clonidine was increased due to BP. ENT was consulted and noted no lesion on laryngoscopy, no active bleeding. Glottic edema from intubation was noted. Shawna [name] recommended to monitor and expect continued improvement, and continue PO as tolerated. She has had no further hemoptysis and no chest pain. Rhythm has been stable. She does have thrush and renal recommended Mycelex. Due to her hypertension, Shawna [name] restarted clonidine. Case management noted that Shawna [name] has already filled her prescription with the 30-day free Brilinta card and is working with her for copay reduction in the future. It was recommended Shawna [name] apply for [facility] for further assistance. She was scheduled for discharge on 01/19/2012; however, she was noted to have bright red blood on bed sheet during dialysis. It was not clear at the time  if this was of rectal, vaginal or urinary source but given this finding in setting of chronic anemia, her discharge was canceled and GI was consulted for further recommendations. Of note, she also had an esophageal abnormality seen on CT scan. GI recommended an EGD which the patient underwent on 01/20/2012 revealing an AVM in the descending duodenum with active bleeding. This was treated using epinephrine injection. Her Hgb remained low (chronic) but stable. She continued hemodialysis with intermittent hypotension during treatment. Nephrology has recommended she continue outpatient HD in [place] but with longer dialysis times to prevent rapid changes in BP. This has been arranged by Dr. Marval Regal. GI recommended a goal Hgb >9. She was evaluated by PT. She has been ambulating without difficulty. She is feeling better and is hemodynamically stable. She has been seen, examined and deemed stable for discharge today by Dr. Thompson Grayer. She will follow-up with Dr. Domenic [name] in the [facility] office next week. She will follow-up with her PCP in 1-2 weeks.  Physical Exam: Vitals: BP 159/73  Pulse 85  Temp 98.6 F (37 C) (Oral)  Resp 18  Ht 5\' 2"  (1.575 m)  Wt 140 lb 3.4 oz (63.6 kg)  BMI 25.65 kg/m2  SpO2 95% General: Well developed 71 year old female in no acute distress. Neck: Supple. JVD not elevated. Lungs: Clear bilaterally to auscultation without wheezes, rales, or rhonchi. Breathing is unlabored. Heart: RRR S1 S2 without  murmurs, rubs, or gallops.  Abdomen: Soft, non-distended. Extremities: No clubbing or cyanosis. No edema.  Distal pedal pulses are 2+ and equal bilaterally. Neuro: Alert and oriented X 3. Moves all extremities spontaneously. No focal deficits.  Labs: Component Value Date   WBC 16.1* 01/25/2012   HGB 9.5* 01/25/2012   HCT 29.3* 01/25/2012   MCV 91.0 01/25/2012   PLT 416* 01/25/2012     Lab 01/25/12 0500  NA 135  K 4.3  CL 93*  CO2 26  BUN 44*  CREATININE 8.19*    CALCIUM 9.2  PROT --  BILITOT --  ALKPHOS --  ALT --  AST --  GLUCOSE 137*   Component Value Date   CKTOTAL 224* 01/12/2012   CKMB 5.2* 01/12/2012   TROPONINI 0.98* 01/12/2012   Disposition:  The patient is being discharged in stable condition.  Follow-up: Follow-up Information    Follow up with Rozann Lesches, MD on 02/02/2012. (At 8:00 AM for hospital follow-up)    Contact information:   [facility] [place]  [address] [place] [phone]      Follow up with Shawna Percy, MD. Schedule an appointment as soon as possible for a visit in 2 weeks. (Within 1-2 weeks after discharge for hospital follow-up)    Contact information:   [place] [phone]   Discharge Medications:  Medication List  As of 01/25/2012  3:23 PM   STOP taking these medications     cloNIDine 0.2 MG tablet      fluconazole 100 MG tablet      labetalol 200 MG tablet       TAKE these medications     allopurinol 300 MG tablet   Commonly known as: ZYLOPRIM   Take 300 mg by mouth daily.      ALPRAZolam 0.25 MG tablet   Commonly known as: XANAX   Take 0.25 mg by mouth at bedtime as needed. For anxiety.      amLODipine 10 MG tablet   Commonly known as: NORVASC   Take 10 mg by mouth daily.      aspirin EC 81 MG tablet   Take 81 mg by mouth daily.      atorvastatin 20 MG tablet   Commonly known as: LIPITOR   Take 20 mg by mouth daily.      folic acid-vitamin b complex-vitamin c-selenium-zinc 3 MG Tabs   Take 1 tablet by mouth daily.      iron polysaccharides 150 MG capsule   Commonly known as: NIFEREX   Take 150 mg by mouth 2 (two) times daily.      lanthanum 500 MG chewable tablet   Commonly known as: FOSRENOL   Chew 1,000 mg by mouth 3 (three) times daily with meals.      loratadine 10 MG tablet   Commonly known as: CLARITIN   Take 10 mg by mouth daily as needed. For allergies.      metoprolol tartrate 25 MG tablet    Commonly known as: LOPRESSOR   Take 2 tablets (50 mg total) by mouth 2 (two) times daily.      nitroGLYCERIN 0.4 MG SL tablet   Commonly known as: NITROSTAT   Place 0.4 mg under the tongue every 5 (five) minutes as needed. For chest pain.      pantoprazole 20 MG tablet   Commonly known as: PROTONIX   Take 2 tablets (40 mg total) by mouth  2 (two) times daily.      Ticagrelor 90 MG Tabs tablet   Commonly known as: BRILINTA   Take 90 mg by mouth 2 (two) times daily.      Duration of Discharge Encounter: Greater than 30 minutes including physician time.  Signed, Ileene Hutchinson, PA-C 01/25/2012, 3:23 PM   I have seen, examined the patient, and reviewed the above assessment and plan.  Changes to above are made where necessary.    Co Sign: Thompson Grayer, MD 01/25/2012 8:12 PM

## 2012-01-25 NOTE — Clinical Social Work Psychosocial (Signed)
Clinical Social Work Department BRIEF PSYCHOSOCIAL ASSESSMENT 01/25/2012  Patient:  Shawna [name], Shawna [name]     Account Number:  [id number]     Admit date:  01/09/2012  Clinical Social Worker:  Frederico Hamman  Date/Time:  01/25/2012 12:15 PM  Referred by:  RN  Date Referred:  01/25/2012 Referred for  Other - See comment   Other Referral:   Information on applying for Medicaid and medications.   Interview type:  Patient Other interview type:   CSW also talked with husband [name] who was at the bedside    PSYCHOSOCIAL DATA Living Status:  HUSBAND Admitted from facility:   Level of care:   Primary support name:  Rochelle Sepulvado Primary support relationship to patient:   Degree of support available:   Strong support    CURRENT CONCERNS Current Concerns  Financial Resources   Other Concerns:   Where to apply for Medicaid    [place] / [employer] CSW was asked to come to patient's room. Patient and husband concerned about the costs of patient's medication, especially a medication for her heart that costs $44 out of pocket after insurance payment. Pt lives in [place] but receives all of her medical services in [place], including her dialysis treatments. CSW advised patient that she would have to apply for Medicaid in [place].   Assessment/plan status:   Other assessment/ plan:   Information/referral to community resources:   Brilinta free 30-Day supply coupon    PATIENT'S/FAMILY'S RESPONSE TO PLAN OF CARE: CSW provided patient with informaiton on 30-day free supply of her medication, however they had already taken advantage of that offer. CSW Patient aencouraged patient to apply for Medicaid as soon as she could. Patient and husband very appreciative of CSW information and attempts to assist them with paying for medication.  **CSW signing off as requested information provided. Please reconsult if other services needed.

## 2012-01-26 ENCOUNTER — Telehealth: Payer: Self-pay | Admitting: Pharmacist

## 2012-01-26 NOTE — Telephone Encounter (Signed)
Called to make sure pt was doing well since discharge yesterday.  She is still a little hoarse from being intubated but other than that feels okay.  She has had a problem with her blood pressure running high today.  Most recently it was 187/90.  Reviewed hospital records.  She was hypertensive in the hospital but had some episodes of hypotension during dialysis so her clonidine was stopped.  Metoprolol was started in place of labetalol but other than that, her HTN meds are the same.  Instructed pt to take one of her clonidine tablets now then check her blood pressure in the morning and call our office.  It it remains high, may need to restart clonidine or add additional therapy.

## 2012-01-27 ENCOUNTER — Telehealth: Payer: Self-pay | Admitting: *Deleted

## 2012-01-27 ENCOUNTER — Encounter: Payer: Medicare Other | Admitting: Cardiology

## 2012-01-27 NOTE — Telephone Encounter (Signed)
Spoke with patient and she states her BP has been running high since coming home from hospital. She said it stays around 188/79. Patient states her labetalol and clonidine was stopped while in hospital and now she is having problems again with her BP. Please advise.

## 2012-01-27 NOTE — Telephone Encounter (Signed)
Message left on nurse's voicemail on yesterday at 4:58pm that since being d/c from [facility], her BP has been elevated. Nurse called patient back this morning and left message on patients message machine to call our office with specific numbers of BP results.

## 2012-01-27 NOTE — Telephone Encounter (Signed)
I have not yet seen this patient in the office for followup. Her most recent visit was with Mr. Jacqualine Mau. She subsequently had a complex hospital stay at [facility]. I reviewed her records. It looks like her labetalol was stopped because she was placed on Lopressor instead. I do not have complete information to guide her medical therapy at this point. It would be important to ascertain what her blood pressure has been running over time (it was low for at time in the hospital), including her heart rates. What dose of clonidine was she on previously? Is she undergoing hemodialysis, and if so what is her blood pressure and heart rate during the sessions? It looks like she is scheduled to see me next Thursday. She may need to have a closer visit with her primary care physician or nephrologist if further medication adjustments are needed in the interim.  Satira Sark, M.D., F.A.C.C.

## 2012-01-27 NOTE — Telephone Encounter (Signed)
Called pt to follow up on BP today.  It was high this morning (SBP in the 180s) but stated it fell to the 120s at dialysis and she started to feel funny.  They gave her saline and she felt much better.  Told pt just to continue on medications she was discharged on from hospital over the weekend (no clonidine) and will send note to Dr. Domenic Polite for further directions.

## 2012-01-27 NOTE — Telephone Encounter (Signed)
Patient informed and states her BP after dialysis today was 166/87. Patient informed to continue monitoring her BP's and have dialysis center/nephrologist monitor this along with PCP. Patient verbalized understanding of plan. Patient currently on clonidine 0.2 mg bid.

## 2012-01-28 DIAGNOSIS — R0602 Shortness of breath: Secondary | ICD-10-CM

## 2012-02-02 ENCOUNTER — Ambulatory Visit (INDEPENDENT_AMBULATORY_CARE_PROVIDER_SITE_OTHER): Payer: Medicare Other | Admitting: Cardiology

## 2012-02-02 ENCOUNTER — Encounter: Payer: Self-pay | Admitting: Cardiology

## 2012-02-02 VITALS — BP 98/55 | HR 74 | Ht 61.0 in | Wt 134.0 lb

## 2012-02-02 DIAGNOSIS — D649 Anemia, unspecified: Secondary | ICD-10-CM

## 2012-02-02 DIAGNOSIS — R5381 Other malaise: Secondary | ICD-10-CM

## 2012-02-02 DIAGNOSIS — Z79899 Other long term (current) drug therapy: Secondary | ICD-10-CM

## 2012-02-02 DIAGNOSIS — E785 Hyperlipidemia, unspecified: Secondary | ICD-10-CM

## 2012-02-02 DIAGNOSIS — R5383 Other fatigue: Secondary | ICD-10-CM

## 2012-02-02 DIAGNOSIS — N186 End stage renal disease: Secondary | ICD-10-CM

## 2012-02-02 DIAGNOSIS — I2510 Atherosclerotic heart disease of native coronary artery without angina pectoris: Secondary | ICD-10-CM

## 2012-02-02 DIAGNOSIS — I10 Essential (primary) hypertension: Secondary | ICD-10-CM

## 2012-02-02 MED ORDER — LABETALOL HCL 200 MG PO TABS: 400.0000 mg | ORAL_TABLET | Freq: Two times a day (BID) | ORAL | Status: DC

## 2012-02-02 NOTE — Assessment & Plan Note (Signed)
Continue statin therapy and followup FLP for next visit.

## 2012-02-02 NOTE — Patient Instructions (Addendum)
Your physician recommends that you schedule a follow-up appointment in: 1 month with Dr. Domenic Polite. Your physician has recommended you make the following change in your medication: stop metoprolol tartrate after today and Start Labetalol 400 mg twice daily. Change Clonidine 0.2 mg twice daily as needed to 0.1 mg twice daily as needed. Break your 0.2 mg clonidine in 1/2. Your new changes have been sent to your pharmacy. Your physician recommends that you return for a FASTING lipid/liver,Kidney function, and blood count lab work in 1 month just before your next visit. You may have this done at your dialysis center and have results sent to our office. Please have your blood pressure monitor checked as soon as possible. Continue to monitor your blood pressures at home and keep a log of them. Bring these results with you to your next appointment along with your monitor.

## 2012-02-02 NOTE — Assessment & Plan Note (Signed)
Has been difficult to manage. Would avoid using p.r.n. clonidine if possible to avoid sudden shifts in blood pressure. Plan to change from metoprolol back to labetalol, starting at 400 mg twice daily. Otherwise continue Norvasc. Followup CMET for next visit.

## 2012-02-02 NOTE — Progress Notes (Signed)
Clinical Summary Ms. Gillan is a medically complex 71 y.o.female presenting for post hospital followup. This is my first meeting with her in the office. Recent records were reviewed, including complex hospital stay. She presents with her husband. Reports compliance with hemodialysis. Still has fluctuations in blood pressure from high to low. It sounds like her blood pressure is typically low at the end of her hemodialysis sessions, however systolics are to up to the 180s and 190s at other times. She states that she does not take her regular medications on the morning of hemodialysis. This morning she actually took a p.r.n. dose of clonidine 0.2 mg resulting in low blood pressure. She is somewhat sleepy this morning but in no distress.  She denies any angina. No reported bleeding problems. Continues to follow with Dr. Lowanda Foster and Dr. Nadara Mustard.  I reviewed her medications, discussed the critical importance of DAPT.   Allergies  Allergen Reactions  . Aspirin Other (See Comments)    Mess up her stomach; "makes my bowels have blood in them"  . Contrast Media (Iodinated Diagnostic Agents) Itching  . Macrodantin (Nitrofurantoin Macrocrystal) Other (See Comments)    "broke me out in big old knots all over my body; had to go to ER"  . Penicillins Other (See Comments)    "makes me real weak when I take it; like I'll pass out"  . Bactrim (Sulfamethoxazole W-Trimethoprim) Rash  . Sulfa Antibiotics Rash  . Plavix (Clopidogrel Bisulfate) Rash    Current Outpatient Prescriptions  Medication Sig Dispense Refill  . ALPRAZolam (XANAX) 0.25 MG tablet Take 0.25-0.5 mg by mouth at bedtime as needed. For anxiety.      Marland Kitchen amLODipine (NORVASC) 10 MG tablet Take 10 mg by mouth daily.      Marland Kitchen aspirin EC 81 MG tablet Take 81 mg by mouth daily.      Marland Kitchen atorvastatin (LIPITOR) 20 MG tablet Take 20 mg by mouth daily.      . cefdinir (OMNICEF) 300 MG capsule Take 1 tablet by mouth Twice daily.      . cloNIDine  (CATAPRES) 0.2 MG tablet Take 0.1 mg by mouth 2 (two) times daily as needed.       Marland Kitchen Dextromethorphan-Guaifenesin (MUCINEX DM MAXIMUM STRENGTH) 60-1200 MG per 12 hr tablet Take 1 tablet by mouth 2 (two) times daily as needed.      . folic acid-vitamin b complex-vitamin c-selenium-zinc (DIALYVITE) 3 MG TABS Take 1 tablet by mouth daily.      . iron polysaccharides (NIFEREX) 150 MG capsule Take 150 mg by mouth 2 (two) times daily.      Marland Kitchen lanthanum (FOSRENOL) 500 MG chewable tablet Chew 1,000 mg by mouth 3 (three) times daily with meals.      Marland Kitchen loratadine (CLARITIN) 10 MG tablet Take 10 mg by mouth daily as needed. For allergies.      . metoprolol tartrate (LOPRESSOR) 25 MG tablet Take 50 mg by mouth 2 (two) times daily. STOP 02/03/12 CHANGED TO LABETALOL      . nitroGLYCERIN (NITROSTAT) 0.4 MG SL tablet Place 0.4 mg under the tongue every 5 (five) minutes as needed. For chest pain.      . pantoprazole (PROTONIX) 20 MG tablet Take 2 tablets (40 mg total) by mouth 2 (two) times daily.  120 tablet  3  . Ticagrelor (BRILINTA) 90 MG TABS tablet Take 90 mg by mouth 2 (two) times daily.      Marland Kitchen DISCONTD: metoprolol tartrate (LOPRESSOR) 25 MG tablet Take  2 tablets (50 mg total) by mouth 2 (two) times daily.  120 tablet  3  . labetalol (NORMODYNE) 200 MG tablet Take 2 tablets by mouth Twice daily. START ON 02/03/12 AND STOP METOPROLOL TARTRATE        Past Medical History  Diagnosis Date  . ESRD (end stage renal disease) on dialysis 12/15/11  . Essential hypertension, benign   . Chronic bronchitis   . Pneumonia   . Anemia   . History of blood transfusion 07/2011    "first time"  . GERD (gastroesophageal reflux disease)   . History of stomach ulcers   . History of lower GI bleeding   . Arthritis     "hands and feet"  . History of gout   . Anxiety   . Ovarian cancer 1992  . Colon cancer   . Coronary atherosclerosis of native coronary artery     DES circ and diag 6/13, DES RCA 7/13, LVEF 55%  . NSTEMI  (non-ST elevated myocardial infarction)     Social History Ms. Greenlaw reports that she has never smoked. She has never used smokeless tobacco. Ms. Zieser reports that she does not drink alcohol.  Review of Systems No palpitations. No reported melena or hematochezia. Reports stable appetite. No fevers or chills. Otherwise negative.  Physical Examination Filed Vitals:   02/02/12 0812  BP: 98/55  Pulse: 74   Thin elderly woman in no acute distress. HEENT: Conjunctiva and lids normal, oropharynx clear. Neck: Supple, no elevated JVP, no thyromegaly. Lungs: Clear to auscultation, diminished, nonlabored breathing at rest. Cardiac: Regular rate and rhythm, no S3, soft systolic murmur, no pericardial rub. Abdomen: Soft, nontender, bowel sounds present, no guarding or rebound. Extremities: No pitting edema, distal pulses 1+. Skin: Warm and dry. Musculoskeletal: No kyphosis. Neuropsychiatric: Alert and oriented x3, affect grossly appropriate.    Problem List and Plan   Coronary atherosclerosis of native coronary artery Clinically stable at this point, no active angina. We discussed the critical importance of continuing DAPT. She is status post recent placement of DES to the circumflex, diagonal, and RCA. Recent assessment of LVEF approximately 55%. Close followup arranged. CBC for next visit.  Essential hypertension, benign Has been difficult to manage. Would avoid using p.r.n. clonidine if possible to avoid sudden shifts in blood pressure. Plan to change from metoprolol back to labetalol, starting at 400 mg twice daily. Otherwise continue Norvasc. Followup [facility] for next visit.  HLD (hyperlipidemia) Continue statin therapy and followup FLP for next visit.    Satira Sark, M.D., F.A.C.C.

## 2012-02-02 NOTE — Assessment & Plan Note (Addendum)
Clinically stable at this point, no active angina. We discussed the critical importance of continuing DAPT. She is status post recent placement of DES to the circumflex, diagonal, and RCA. Recent assessment of LVEF approximately 55%. Close followup arranged. CBC for next visit.

## 2012-02-09 ENCOUNTER — Telehealth: Payer: Self-pay | Admitting: *Deleted

## 2012-02-09 ENCOUNTER — Other Ambulatory Visit: Payer: Self-pay | Admitting: Physician Assistant

## 2012-02-09 ENCOUNTER — Encounter: Payer: Self-pay | Admitting: Physician Assistant

## 2012-02-09 DIAGNOSIS — R079 Chest pain, unspecified: Secondary | ICD-10-CM

## 2012-02-09 NOTE — Telephone Encounter (Signed)
Patient c/o pinching pain around her heart.  Has taken one NTG without relief.  Advised to go to ED for evaluation.  She verbalized understanding & husband will provide transportation.

## 2012-02-10 ENCOUNTER — Other Ambulatory Visit: Payer: Self-pay | Admitting: Physician Assistant

## 2012-02-10 DIAGNOSIS — R079 Chest pain, unspecified: Secondary | ICD-10-CM

## 2012-03-01 ENCOUNTER — Ambulatory Visit (INDEPENDENT_AMBULATORY_CARE_PROVIDER_SITE_OTHER): Payer: Medicare Other | Admitting: Physician Assistant

## 2012-03-01 ENCOUNTER — Encounter: Payer: Self-pay | Admitting: Physician Assistant

## 2012-03-01 VITALS — BP 172/74 | HR 80 | Ht 61.0 in | Wt 136.0 lb

## 2012-03-01 DIAGNOSIS — E785 Hyperlipidemia, unspecified: Secondary | ICD-10-CM

## 2012-03-01 DIAGNOSIS — I209 Angina pectoris, unspecified: Secondary | ICD-10-CM | POA: Insufficient documentation

## 2012-03-01 DIAGNOSIS — I10 Essential (primary) hypertension: Secondary | ICD-10-CM

## 2012-03-01 DIAGNOSIS — R079 Chest pain, unspecified: Secondary | ICD-10-CM

## 2012-03-01 DIAGNOSIS — R0989 Other specified symptoms and signs involving the circulatory and respiratory systems: Secondary | ICD-10-CM

## 2012-03-01 NOTE — Progress Notes (Signed)
Primary Cardiologist: Johnny Bridge, MD   HPI: Recently seen in consultation at [facility], following presentation with atypical CP. Normal cardiac markers. No further in-house workup recommended, with plans for outpatient repeat 2-D echo (followup of abnormal echo, 01/08/12, yielding EF 35-40%), although LV function assessed as normal (EF 55%), by recent cardiac catheterization, 12/15/11. We also recommend resumption of ACE inhibitor therapy with lisinopril 10 mg daily. Patient has ESRD, on hemodialysis.  Clinically, she denies any recurrent CP. Unfortunately, although she purchased her prescription for lisinopril, she has not started taking this medication. She checks her BP at home 2-3 times daily, with typical SBPs in the 160 range.  Allergies  Allergen Reactions  . Aspirin Other (See Comments)    Mess up her stomach; "makes my bowels have blood in them"  . Contrast Media (Iodinated Diagnostic Agents) Itching  . Macrodantin (Nitrofurantoin Macrocrystal) Other (See Comments)    "broke me out in big old knots all over my body; had to go to ER"  . Penicillins Other (See Comments)    "makes me real weak when I take it; like I'll pass out"  . Bactrim (Sulfamethoxazole W-Trimethoprim) Rash  . Sulfa Antibiotics Rash  . Plavix (Clopidogrel Bisulfate) Rash    Current Outpatient Prescriptions  Medication Sig Dispense Refill  . ALPRAZolam (XANAX) 0.25 MG tablet Take 0.25-0.5 mg by mouth at bedtime as needed. For anxiety.      Marland Kitchen amLODipine (NORVASC) 10 MG tablet Take 10 mg by mouth daily.      Marland Kitchen aspirin EC 81 MG tablet Take 81 mg by mouth daily.      Marland Kitchen atorvastatin (LIPITOR) 20 MG tablet Take 20 mg by mouth daily.      . cloNIDine (CATAPRES) 0.2 MG tablet Take 0.1 mg by mouth 2 (two) times daily as needed.       Marland Kitchen Dextromethorphan-Guaifenesin (MUCINEX DM MAXIMUM STRENGTH) 60-1200 MG per 12 hr tablet Take 1 tablet by mouth 2 (two) times daily as needed.      . folic acid-vitamin b complex-vitamin  c-selenium-zinc (DIALYVITE) 3 MG TABS Take 1 tablet by mouth daily.      . iron polysaccharides (NIFEREX) 150 MG capsule Take 150 mg by mouth 2 (two) times daily.      Marland Kitchen labetalol (NORMODYNE) 200 MG tablet Take 2 tablets (400 mg total) by mouth 2 (two) times daily. START ON 02/03/12 AND STOP METOPROLOL TARTRATE  120 tablet  3  . lanthanum (FOSRENOL) 500 MG chewable tablet Chew 1,000 mg by mouth 3 (three) times daily with meals.      Marland Kitchen lisinopril (PRINIVIL,ZESTRIL) 10 MG tablet Take 1 tablet by mouth Daily.      Marland Kitchen loratadine (CLARITIN) 10 MG tablet Take 10 mg by mouth daily as needed. For allergies.      . nitroGLYCERIN (NITROSTAT) 0.4 MG SL tablet Place 0.4 mg under the tongue every 5 (five) minutes as needed. For chest pain.      . pantoprazole (PROTONIX) 20 MG tablet Take 2 tablets (40 mg total) by mouth 2 (two) times daily.  120 tablet  3  . Ticagrelor (BRILINTA) 90 MG TABS tablet Take 90 mg by mouth 2 (two) times daily.        Past Medical History  Diagnosis Date  . ESRD (end stage renal disease) on dialysis 12/15/11  . Essential hypertension, benign   . Chronic bronchitis   . Pneumonia   . Anemia   . History of blood transfusion 07/2011    "first  time"  . GERD (gastroesophageal reflux disease)   . History of stomach ulcers   . History of lower GI bleeding   . Arthritis     "hands and feet"  . History of gout   . Anxiety   . Ovarian cancer 1992  . Colon cancer   . Coronary atherosclerosis of native coronary artery     DES circ and diag 6/13, DES RCA 7/13, LVEF 55%  . NSTEMI (non-ST elevated myocardial infarction)     Past Surgical History  Procedure Date  . Coronary angioplasty with stent placement 12/15/11    "2"  . Abdominal hysterectomy 1992  . Appendectomy 06/1990  . Tubal ligation 1980's  . Av fistula placement 07/2009    left upper arm  . Thrombectomy / arteriovenous graft revision     left upper arm  . Colon resection 1992  . Esophagogastroduodenoscopy 01/20/2012     Procedure: ESOPHAGOGASTRODUODENOSCOPY (EGD);  Surgeon: Ladene Artist, MD,FACG;  Location: [facility] ENDOSCOPY;  Service: Endoscopy;  Laterality: N/A;    History   Social History  . Marital Status: Married    Spouse Name: N/A    Number of Children: N/A  . Years of Education: N/A   Occupational History  . Not on file.   Social History Main Topics  . Smoking status: Never Smoker   . Smokeless tobacco: Never Used  . Alcohol Use: No  . Drug Use: No  . Sexually Active: Yes   Other Topics Concern  . Not on file   Social History Narrative  . No narrative on file    No family history on file.  ROS: no nausea, vomiting; no fever, chills; no melena, hematochezia; no claudication  PHYSICAL EXAM: BP 172/74  Pulse 80  Ht 5\' 1"  (1.549 m)  Wt 136 lb (61.689 kg)  BMI 25.70 kg/m2  SpO2 99% GENERAL: 71 year-old female; NAD HEENT: NCAT, PERRLA, EOMI; sclera clear; no xanthelasma NECK: palpable bilateral carotid pulses, loud left-sided bruit; no JVD; no TM LUNGS: CTA bilaterally CARDIAC: RRR (S1, S2); no significant murmurs; no rubs or gallops ABDOMEN: soft, non-tender; intact BS EXTREMETIES: trace peripheral edema SKIN: warm/dry; no obvious rash/lesions MUSCULOSKELETAL: no joint deformity NEURO: no focal deficit; NL affect   EKG:    ASSESSMENT & PLAN:  Chest pain No further workup indicated. Recent presentation with atypical CP, normal cardiac markers. Continue current medication regimen, including DAPT for at least one year. Of note, however, will order recommended followup 2-D echo, for reasons as outlined above. Reassess clinical status in 3 months, with Dr. Domenic Polite.  Essential hypertension, benign Patient strongly advised to start ACE inhibitor therapy, as we recently recommended, with lisinopril 10 mg daily. Continue remaining antihypertensives, as currently outlined.  HLD (hyperlipidemia) Well-controlled on current dose Lipitor, by today's blood work with LDL  58.  Bruit We'll order carotid Dopplers to rule out intrinsic left-sided ICA disease, versus transmitted murmur from AV fistula. Patient has history of non-obstructive carotid artery disease, by prior study in 2008.    Gene Arisha Gervais, PAC

## 2012-03-01 NOTE — Assessment & Plan Note (Addendum)
No further workup indicated. Recent presentation with atypical CP, normal cardiac markers. Continue current medication regimen, including DAPT for at least one year. Of note, however, will order recommended followup 2-D echo, for reasons as outlined above. Reassess clinical status in 3 months, with Dr. Domenic Polite.

## 2012-03-01 NOTE — Assessment & Plan Note (Signed)
We'll order carotid Dopplers to rule out intrinsic left-sided ICA disease, versus transmitted murmur from AV fistula. Patient has history of non-obstructive carotid artery disease, by prior study in 2008.

## 2012-03-01 NOTE — Assessment & Plan Note (Signed)
Well-controlled on current dose Lipitor, by today's blood work with LDL 58.

## 2012-03-01 NOTE — Assessment & Plan Note (Signed)
Patient strongly advised to start ACE inhibitor therapy, as we recently recommended, with lisinopril 10 mg daily. Continue remaining antihypertensives, as currently outlined.

## 2012-03-01 NOTE — Patient Instructions (Addendum)
   Resume Lisinopril 10mg  daily  Echo  Carotid Dopplers  Office will call with results Follow up in  3 months

## 2012-03-04 DIAGNOSIS — R079 Chest pain, unspecified: Secondary | ICD-10-CM

## 2012-03-06 DIAGNOSIS — R079 Chest pain, unspecified: Secondary | ICD-10-CM

## 2012-03-08 ENCOUNTER — Other Ambulatory Visit (INDEPENDENT_AMBULATORY_CARE_PROVIDER_SITE_OTHER): Payer: Medicare Other

## 2012-03-08 ENCOUNTER — Other Ambulatory Visit: Payer: Self-pay

## 2012-03-08 DIAGNOSIS — R079 Chest pain, unspecified: Secondary | ICD-10-CM

## 2012-03-08 DIAGNOSIS — I2510 Atherosclerotic heart disease of native coronary artery without angina pectoris: Secondary | ICD-10-CM

## 2012-03-15 ENCOUNTER — Encounter (INDEPENDENT_AMBULATORY_CARE_PROVIDER_SITE_OTHER): Payer: Medicare Other

## 2012-03-15 DIAGNOSIS — R0989 Other specified symptoms and signs involving the circulatory and respiratory systems: Secondary | ICD-10-CM

## 2012-03-15 DIAGNOSIS — I6529 Occlusion and stenosis of unspecified carotid artery: Secondary | ICD-10-CM

## 2012-03-27 ENCOUNTER — Telehealth: Payer: Self-pay | Admitting: *Deleted

## 2012-03-27 NOTE — Telephone Encounter (Signed)
Patient's husband informed

## 2012-03-27 NOTE — Telephone Encounter (Signed)
Message copied by Merlene Laughter on Tue Mar 27, 2012 10:10 AM ------      Message from: Merlene Laughter      Created: Fri Mar 23, 2012  8:50 AM                   ----- Message -----         From: Aurora Mask, PA         Sent: 03/16/2012   4:19 PM           To: Laurine Blazer, LPN            [id number] L ICA stenosis. Recommended f/u in 6 months

## 2012-04-03 ENCOUNTER — Ambulatory Visit: Payer: Medicare Other | Admitting: Cardiology

## 2012-04-13 ENCOUNTER — Telehealth: Payer: Self-pay | Admitting: *Deleted

## 2012-04-13 NOTE — Telephone Encounter (Signed)
Called patient and she was asleep per husband. Patient will call office back.

## 2012-04-16 DIAGNOSIS — R079 Chest pain, unspecified: Secondary | ICD-10-CM

## 2012-04-16 DIAGNOSIS — R7989 Other specified abnormal findings of blood chemistry: Secondary | ICD-10-CM

## 2012-04-16 DIAGNOSIS — I2510 Atherosclerotic heart disease of native coronary artery without angina pectoris: Secondary | ICD-10-CM

## 2012-04-17 DIAGNOSIS — R079 Chest pain, unspecified: Secondary | ICD-10-CM

## 2012-04-17 NOTE — Telephone Encounter (Signed)
Left message on patient's machine stating that once effient supply comes to office, we will give her a call.

## 2012-04-27 DIAGNOSIS — R079 Chest pain, unspecified: Secondary | ICD-10-CM

## 2012-04-29 DIAGNOSIS — I214 Non-ST elevation (NSTEMI) myocardial infarction: Secondary | ICD-10-CM

## 2012-04-30 DIAGNOSIS — I214 Non-ST elevation (NSTEMI) myocardial infarction: Secondary | ICD-10-CM

## 2012-04-30 DIAGNOSIS — I2510 Atherosclerotic heart disease of native coronary artery without angina pectoris: Secondary | ICD-10-CM

## 2012-05-02 ENCOUNTER — Observation Stay (HOSPITAL_COMMUNITY): Payer: Medicare Other

## 2012-05-02 ENCOUNTER — Encounter (HOSPITAL_COMMUNITY): Payer: Self-pay | Admitting: *Deleted

## 2012-05-02 ENCOUNTER — Observation Stay (HOSPITAL_COMMUNITY)
Admission: EM | Admit: 2012-05-02 | Discharge: 2012-05-03 | Disposition: A | Discharge status: Home / Self Care | Payer: Medicare Other | Attending: Internal Medicine | Admitting: Internal Medicine

## 2012-05-02 ENCOUNTER — Emergency Department (HOSPITAL_COMMUNITY): Payer: Medicare Other

## 2012-05-02 DIAGNOSIS — R0989 Other specified symptoms and signs involving the circulatory and respiratory systems: Secondary | ICD-10-CM

## 2012-05-02 DIAGNOSIS — I209 Angina pectoris, unspecified: Secondary | ICD-10-CM | POA: Diagnosis present

## 2012-05-02 DIAGNOSIS — I10 Essential (primary) hypertension: Secondary | ICD-10-CM

## 2012-05-02 DIAGNOSIS — I5042 Chronic combined systolic (congestive) and diastolic (congestive) heart failure: Secondary | ICD-10-CM | POA: Diagnosis present

## 2012-05-02 DIAGNOSIS — E785 Hyperlipidemia, unspecified: Secondary | ICD-10-CM

## 2012-05-02 DIAGNOSIS — I5032 Chronic diastolic (congestive) heart failure: Secondary | ICD-10-CM | POA: Insufficient documentation

## 2012-05-02 DIAGNOSIS — M109 Gout, unspecified: Secondary | ICD-10-CM | POA: Insufficient documentation

## 2012-05-02 DIAGNOSIS — R079 Chest pain, unspecified: Principal | ICD-10-CM

## 2012-05-02 DIAGNOSIS — I509 Heart failure, unspecified: Secondary | ICD-10-CM | POA: Insufficient documentation

## 2012-05-02 DIAGNOSIS — D649 Anemia, unspecified: Secondary | ICD-10-CM | POA: Insufficient documentation

## 2012-05-02 DIAGNOSIS — I120 Hypertensive chronic kidney disease with stage 5 chronic kidney disease or end stage renal disease: Secondary | ICD-10-CM | POA: Insufficient documentation

## 2012-05-02 DIAGNOSIS — K219 Gastro-esophageal reflux disease without esophagitis: Secondary | ICD-10-CM

## 2012-05-02 DIAGNOSIS — Z992 Dependence on renal dialysis: Secondary | ICD-10-CM | POA: Diagnosis present

## 2012-05-02 DIAGNOSIS — I2510 Atherosclerotic heart disease of native coronary artery without angina pectoris: Secondary | ICD-10-CM | POA: Diagnosis present

## 2012-05-02 DIAGNOSIS — N186 End stage renal disease: Secondary | ICD-10-CM

## 2012-05-02 HISTORY — DX: Pure hypercholesterolemia, unspecified: E78.00

## 2012-05-02 HISTORY — DX: Transient cerebral ischemic attack, unspecified: G45.9

## 2012-05-02 HISTORY — DX: Iron deficiency anemia, unspecified: D50.9

## 2012-05-02 LAB — POCT I-STAT, CHEM 8
Calcium, Ion: 1.19 mmol/L (ref 1.13–1.30)
Chloride: 100 mEq/L (ref 96–112)
HCT: 37 % (ref 36.0–46.0)
Sodium: 139 mEq/L (ref 135–145)

## 2012-05-02 LAB — CBC
Hemoglobin: 11.1 g/dL — ABNORMAL LOW (ref 12.0–15.0)
MCHC: 30.9 g/dL (ref 30.0–36.0)
RBC: 4.12 MIL/uL (ref 3.87–5.11)
WBC: 8.2 10*3/uL (ref 4.0–10.5)

## 2012-05-02 LAB — POCT I-STAT TROPONIN I: Troponin i, poc: 0.05 ng/mL (ref 0.00–0.08)

## 2012-05-02 LAB — TROPONIN I: Troponin I: 0.47 ng/mL (ref <0.30)

## 2012-05-02 IMAGING — CT CT CHEST W/O CM
2 of 3 series · 15 of 36 positions shown, 18 images · non-contrast
Comparison: [DATE]

CLINICAL DATA: Chest pain

CT CHEST WITHOUT CONTRAST
TECHNIQUE: Multidetector CT imaging of the chest was performed
following the standard protocol without IV contrast.

[Series 2: routine chest 5.0 st · axial · 0.62mm/px · z∈[-260,-30]mm · 12 of 56 slices shown, 15 images]
[im 5/56  mediastinal]
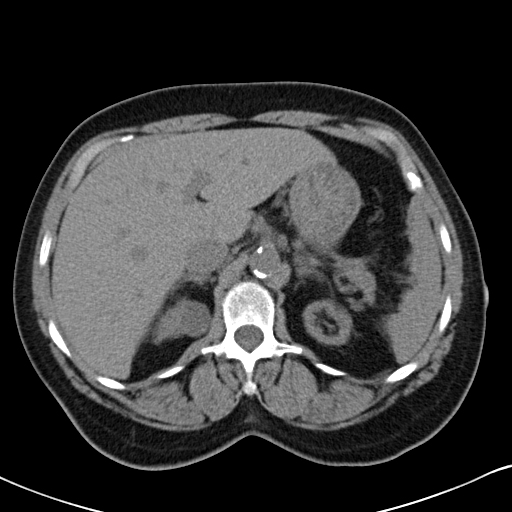
[im 5/56  lung]
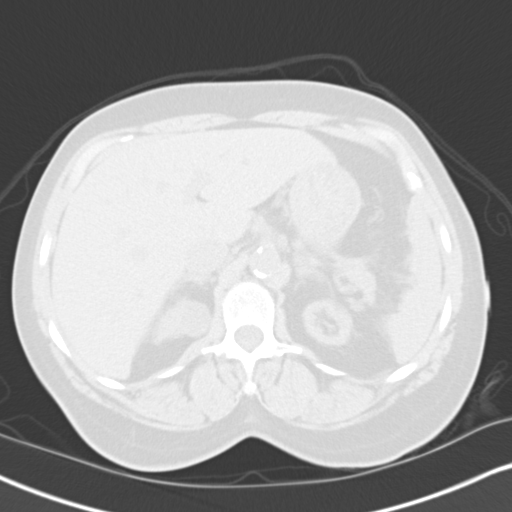
[im 9/56  lung]
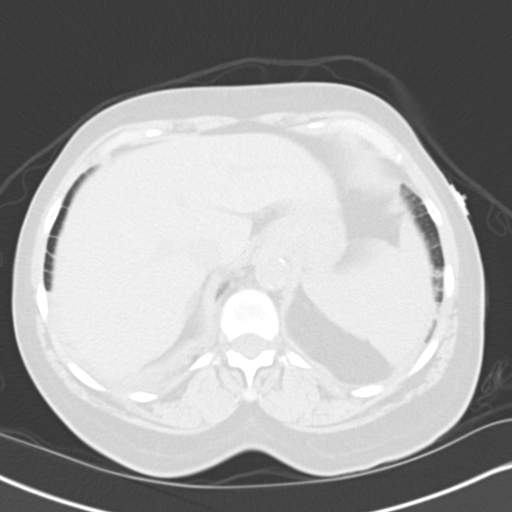
[im 13/56  lung]
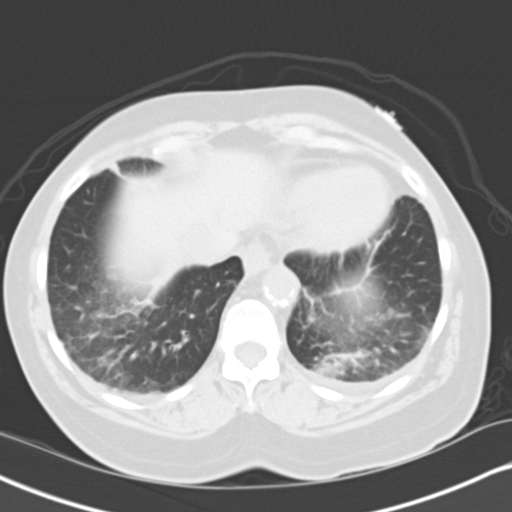
[im 17/56  lung]
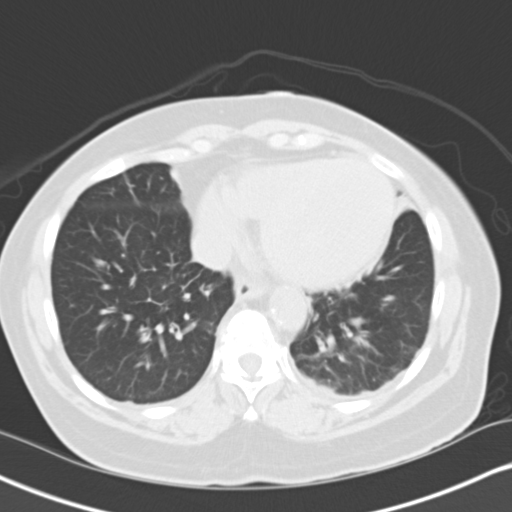
[im 21/56  mediastinal]
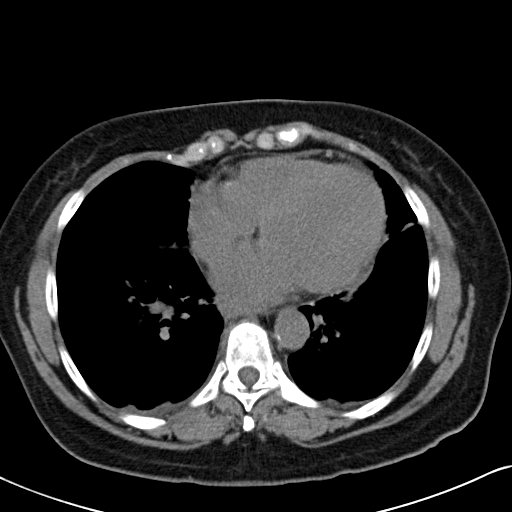
[im 21/56  lung]
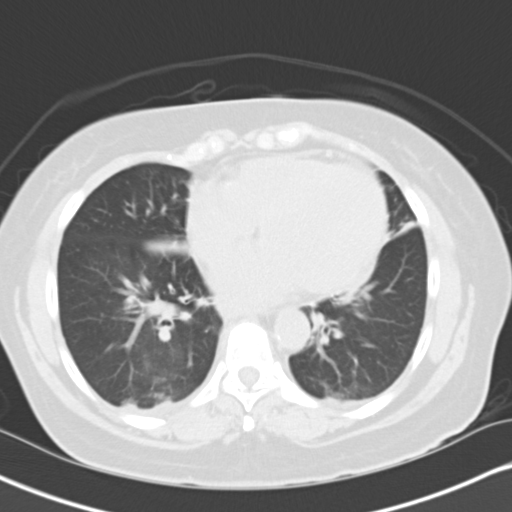
[im 25/56  lung]
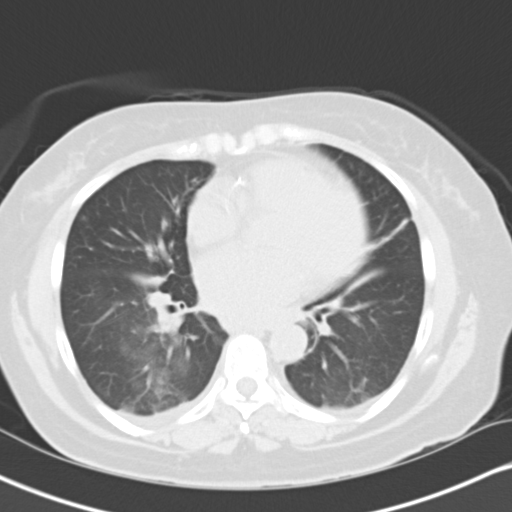
[im 31/56  lung]
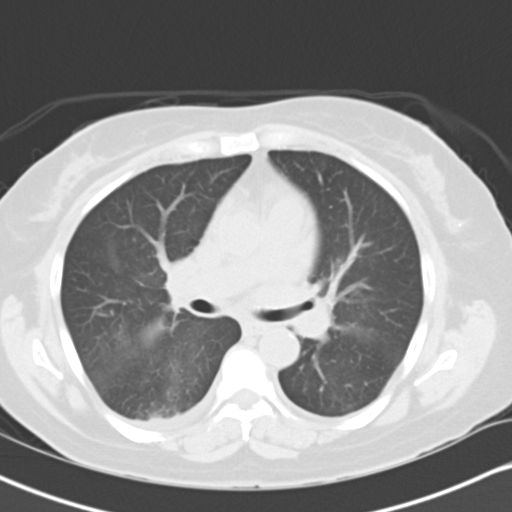
[im 35/56  lung]
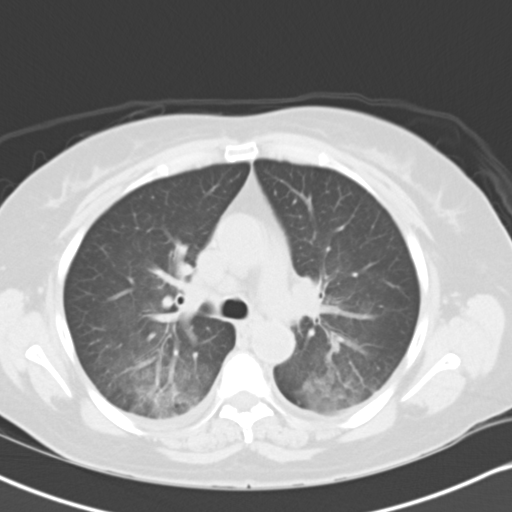
[im 39/56  mediastinal]
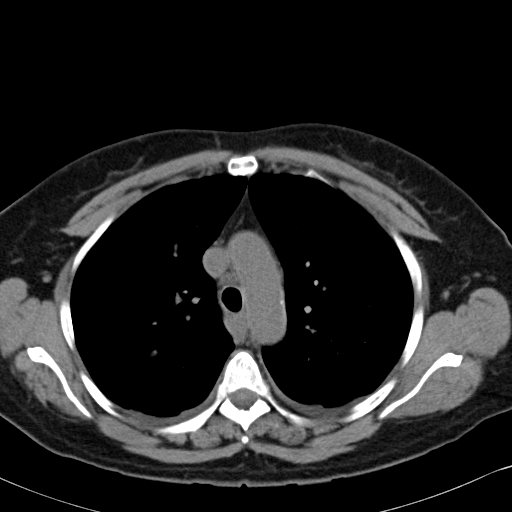
[im 39/56  lung]
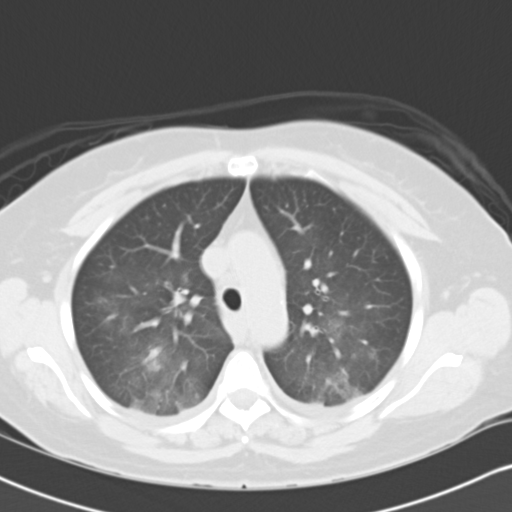
[im 43/56  lung]
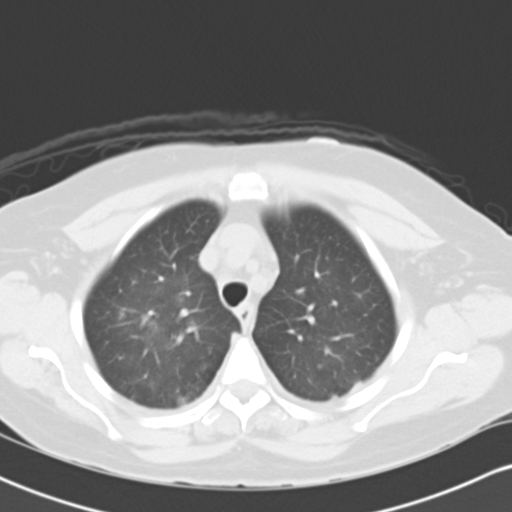
[im 47/56  lung]
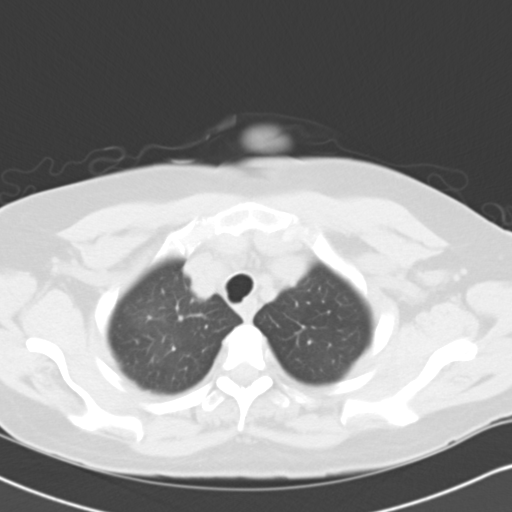
[im 51/56  lung]
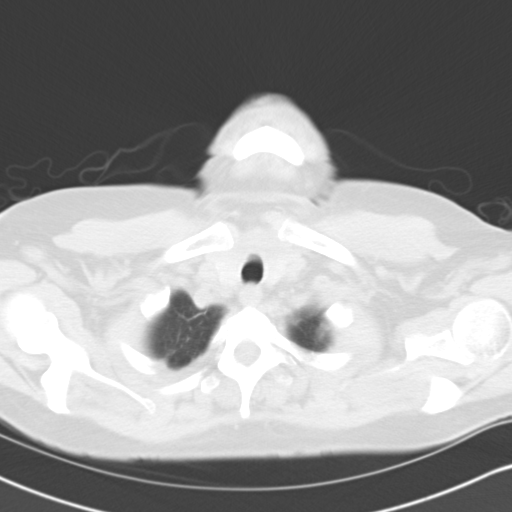

[Series 602: coronal · coronal · 0.62mm/px · 3 of 74 slices shown]
[im 15/74  lung]
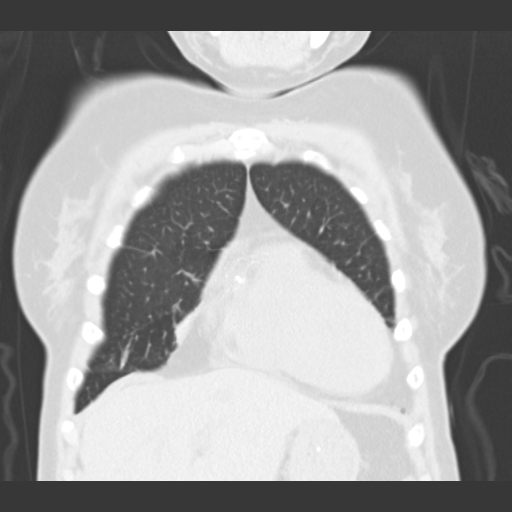
[im 30/74  lung]
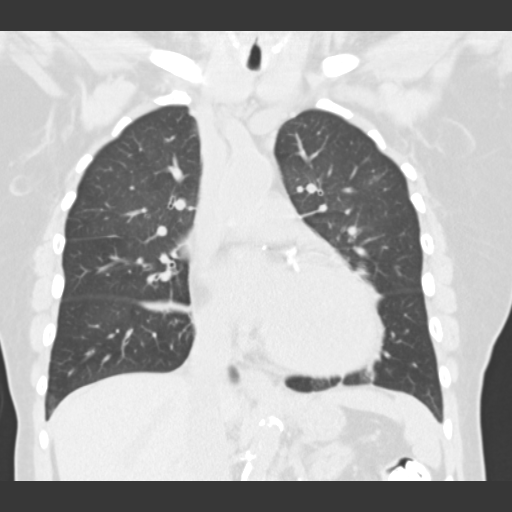
[im 44/74  lung]
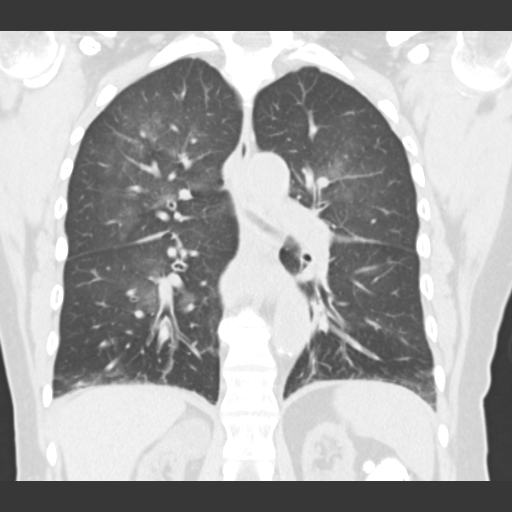

[15 of 36 positions shown; findings below may reference images not displayed]

FINDINGS: Ground-glass type infiltrates are noted in the dependent
lung bilaterally involving the upper and lower lobes.  This was not
present on the CT of [DATE].  There are small  pleural
effusions bilaterally with thickening of the fissure and small
layering effusions posteriorly.  The findings are most suggestive
of congestive heart failure with edema.

Peribronchial thickening is present, unchanged from the prior study
compatible chronic lung disease.  There is coronary artery
calcification.  I cannot evaluate for pulmonary embolism or aortic
dissection without intravenous contrast.  No mass or adenopathy.
Right upper pole renal cyst measuring 3.5 cm.
IMPRESSION: Ground-glass infiltrates in the dependent lung bilaterally with
small pleural effusions.  The findings suggest congestive heart
failure with pulmonary edema.

## 2012-05-02 IMAGING — CR DG CHEST 2V
2 series · 2 of 2 positions shown · non-contrast
Comparison: [DATE]

CLINICAL DATA: Chest pain.

CHEST - 2 VIEW

[w chest pa]
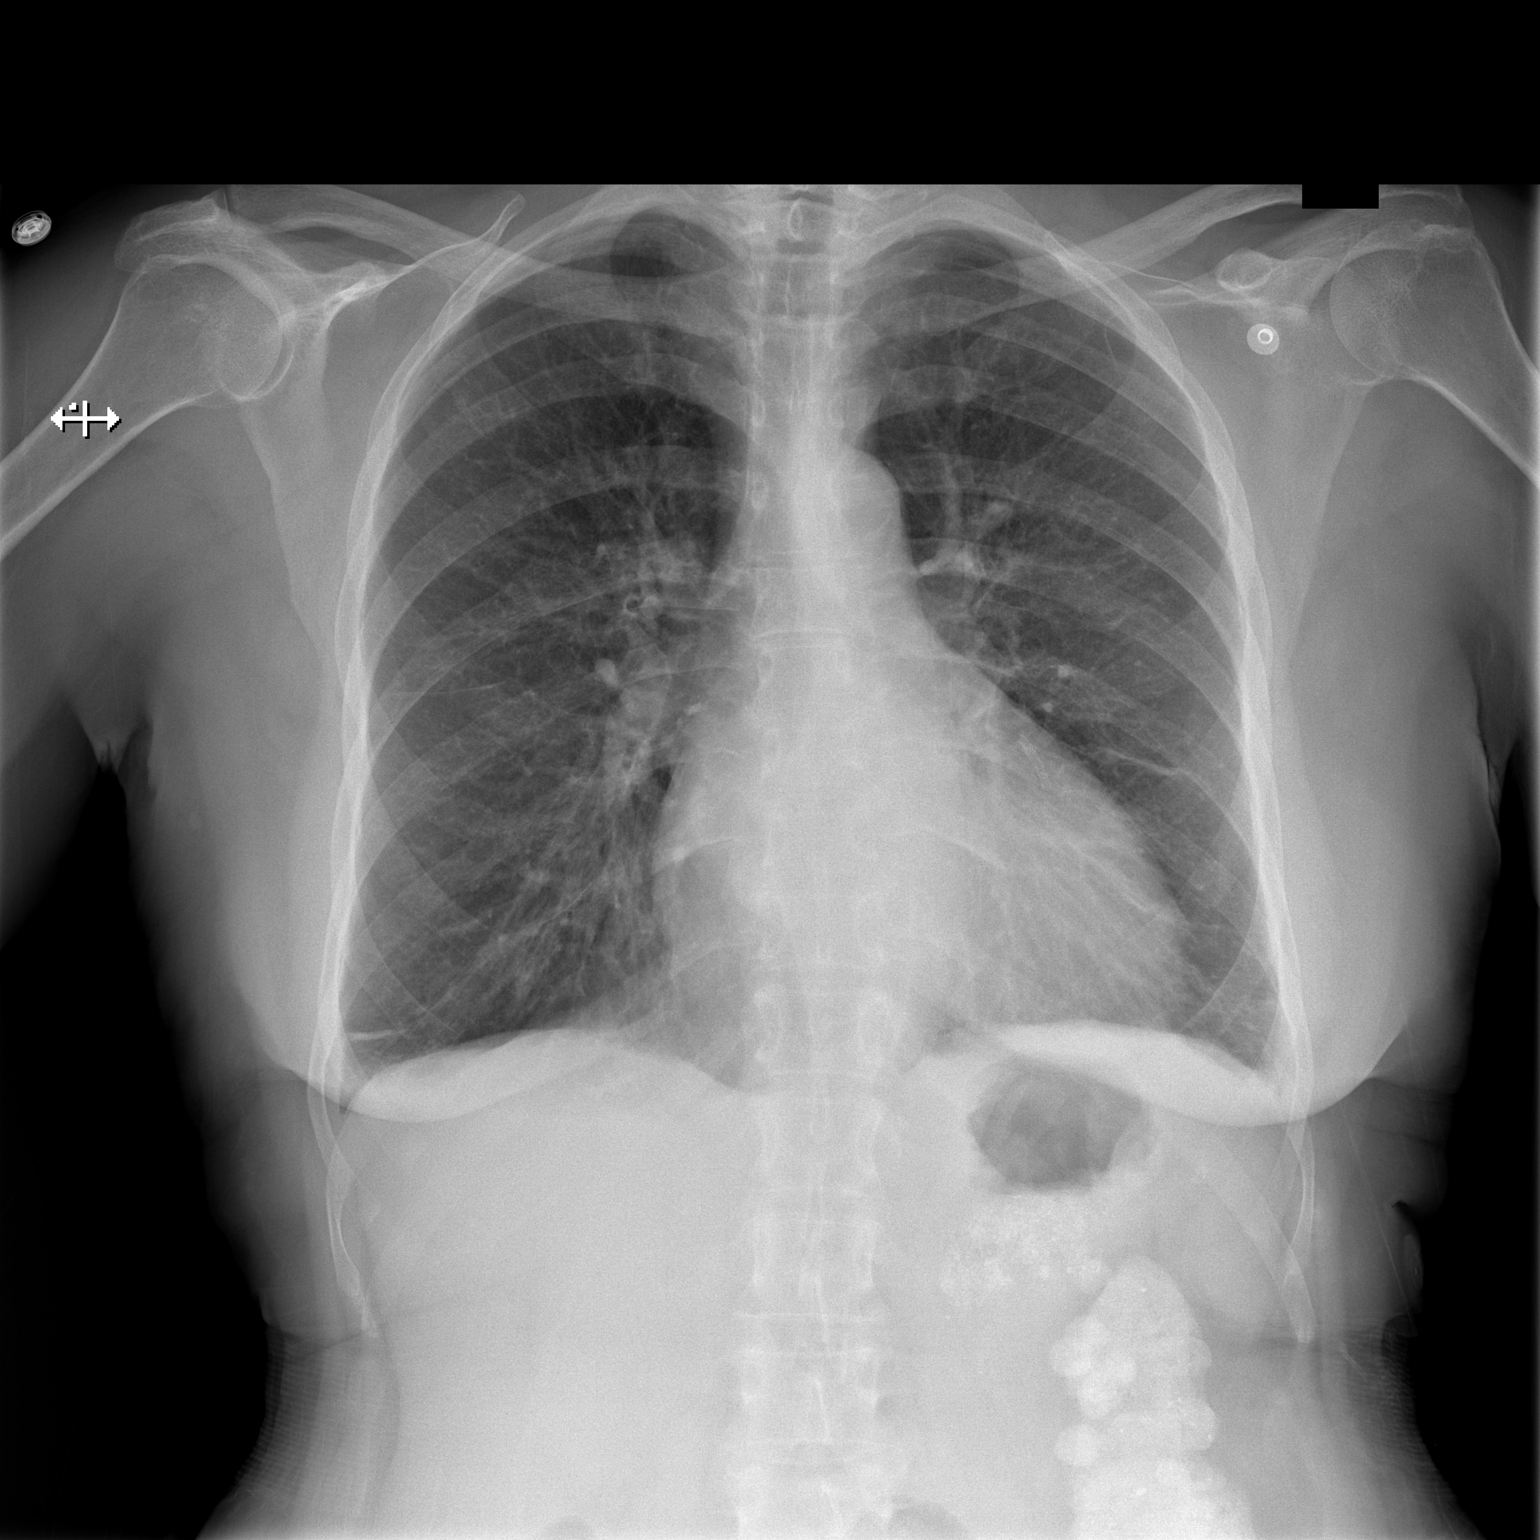

[w chest lat]
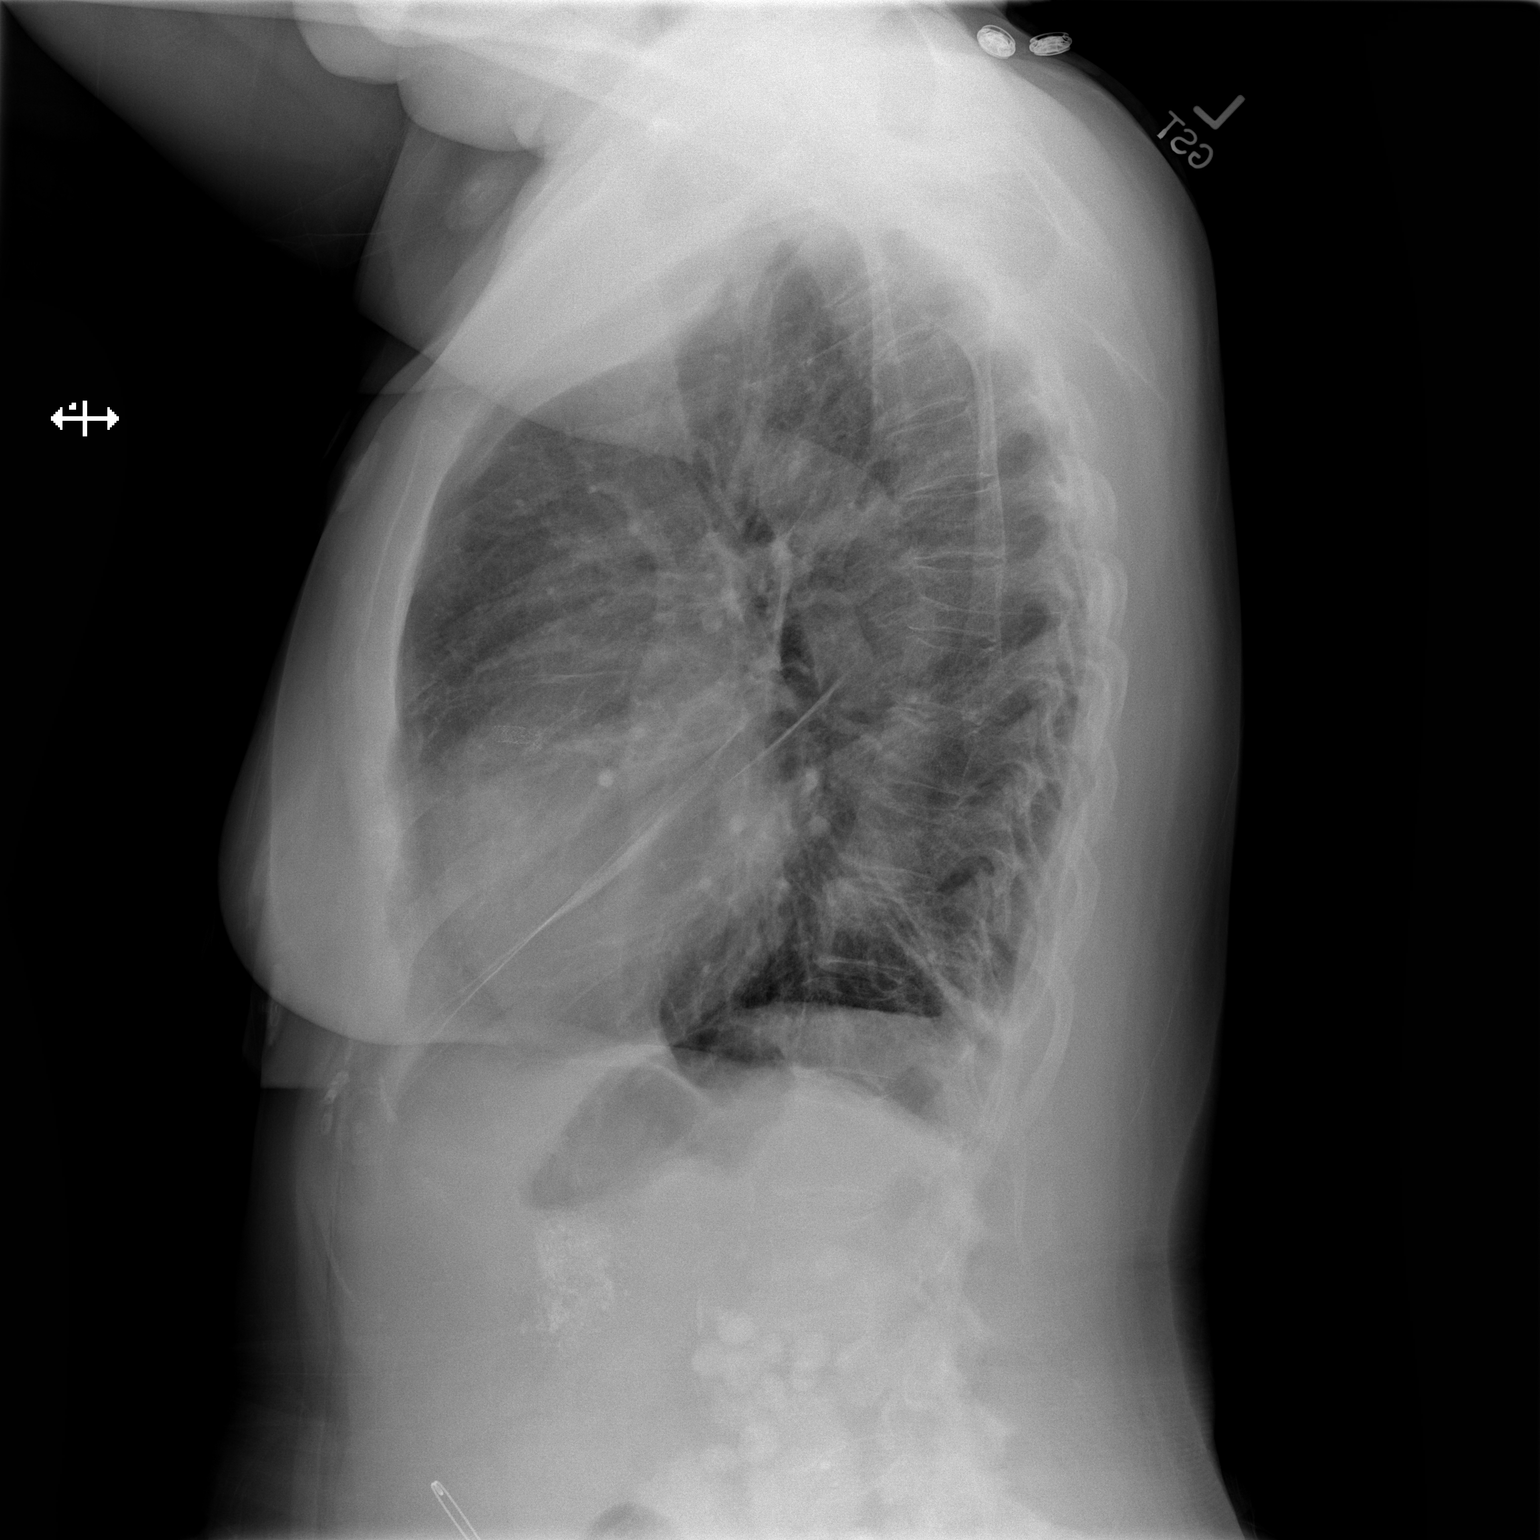

[2 of 2 positions shown; findings below may reference images not displayed]

FINDINGS: Two views of the chest were obtained.  Prominent markings
in the lower lungs appear chronic.  These findings are unchanged.
No evidence for new airspace disease or edema.  Heart size is
within normal limits.
IMPRESSION: Chronic lung changes without acute findings.

## 2012-05-02 MED ORDER — HEPARIN SODIUM (PORCINE) 1000 UNIT/ML DIALYSIS
1000.0000 [IU] | INTRAMUSCULAR | Status: DC | PRN
  Filled 2012-05-02: qty 1

## 2012-05-02 MED ORDER — NEPRO/CARBSTEADY PO LIQD
237.0000 mL | ORAL | Status: DC | PRN
  Filled 2012-05-02: qty 237

## 2012-05-02 MED ORDER — SODIUM CHLORIDE 0.9 % IJ SOLN: 3.0000 mL | Freq: Two times a day (BID) | INTRAMUSCULAR | Status: DC

## 2012-05-02 MED ORDER — DIPHENHYDRAMINE HCL 25 MG PO CAPS
ORAL_CAPSULE | ORAL | Status: AC
  Filled 2012-05-02: qty 1

## 2012-05-02 MED ORDER — DARBEPOETIN ALFA-POLYSORBATE 40 MCG/0.4ML IJ SOLN
INTRAMUSCULAR | Status: AC
  Filled 2012-05-02: qty 0.4

## 2012-05-02 MED ORDER — LABETALOL HCL 200 MG PO TABS
400.0000 mg | ORAL_TABLET | Freq: Two times a day (BID) | ORAL | Status: DC
Start: 2012-05-02 — End: 2012-05-03
  Administered 2012-05-02: 400 mg via ORAL
  Administered 2012-05-02: 200 mg via ORAL
  Administered 2012-05-03: 400 mg via ORAL
  Filled 2012-05-02 (×4): qty 2

## 2012-05-02 MED ORDER — SODIUM CHLORIDE 0.9 % IJ SOLN: 3.0000 mL | INTRAMUSCULAR | Status: DC | PRN

## 2012-05-02 MED ORDER — HEPARIN SODIUM (PORCINE) 1000 UNIT/ML DIALYSIS
20.0000 [IU]/kg | INTRAMUSCULAR | Status: DC | PRN
  Filled 2012-05-02: qty 2

## 2012-05-02 MED ORDER — ALTEPLASE 2 MG IJ SOLR
2.0000 mg | Freq: Once | INTRAMUSCULAR | Status: AC | PRN
  Filled 2012-05-02: qty 2

## 2012-05-02 MED ORDER — SODIUM CHLORIDE 0.9 % IV SOLN: 250.0000 mL | INTRAVENOUS | Status: DC | PRN

## 2012-05-02 MED ORDER — FOLIC ACID 1 MG PO TABS
1.0000 mg | ORAL_TABLET | Freq: Every day | ORAL | Status: DC
  Administered 2012-05-02: 1 mg via ORAL
  Filled 2012-05-02 (×2): qty 1

## 2012-05-02 MED ORDER — TICAGRELOR 90 MG PO TABS
90.0000 mg | ORAL_TABLET | Freq: Two times a day (BID) | ORAL | Status: DC
Start: 2012-05-02 — End: 2012-05-03
  Administered 2012-05-02 – 2012-05-03 (×3): 90 mg via ORAL
  Filled 2012-05-02 (×4): qty 1

## 2012-05-02 MED ORDER — RENA-VITE PO TABS
1.0000 | ORAL_TABLET | Freq: Every day | ORAL | Status: DC
  Administered 2012-05-02: 1 via ORAL
  Filled 2012-05-02 (×2): qty 1

## 2012-05-02 MED ORDER — LIDOCAINE HCL (PF) 1 % IJ SOLN: 5.0000 mL | INTRAMUSCULAR | Status: DC | PRN

## 2012-05-02 MED ORDER — MORPHINE SULFATE 2 MG/ML IJ SOLN
1.0000 mg | INTRAMUSCULAR | Status: DC | PRN
  Administered 2012-05-02: 1 mg via INTRAVENOUS
  Filled 2012-05-02: qty 1

## 2012-05-02 MED ORDER — ATORVASTATIN CALCIUM 10 MG PO TABS
10.0000 mg | ORAL_TABLET | Freq: Every day | ORAL | Status: DC
  Administered 2012-05-02: 10 mg via ORAL
  Filled 2012-05-02 (×2): qty 1

## 2012-05-02 MED ORDER — SODIUM CHLORIDE 0.9 % IV SOLN: 100.0000 mL | INTRAVENOUS | Status: DC | PRN

## 2012-05-02 MED ORDER — LANTHANUM CARBONATE 500 MG PO CHEW
1000.0000 mg | CHEWABLE_TABLET | Freq: Three times a day (TID) | ORAL | Status: DC
  Administered 2012-05-02 – 2012-05-03 (×3): 1000 mg via ORAL
  Filled 2012-05-02 (×7): qty 2

## 2012-05-02 MED ORDER — ISOSORBIDE MONONITRATE ER 60 MG PO TB24
60.0000 mg | ORAL_TABLET | Freq: Every day | ORAL | Status: DC
  Administered 2012-05-02 – 2012-05-03 (×2): 60 mg via ORAL
  Filled 2012-05-02 (×2): qty 1

## 2012-05-02 MED ORDER — LISINOPRIL 10 MG PO TABS
10.0000 mg | ORAL_TABLET | Freq: Every day | ORAL | Status: DC
  Administered 2012-05-02: 10 mg via ORAL
  Filled 2012-05-02 (×2): qty 1

## 2012-05-02 MED ORDER — DOXERCALCIFEROL 4 MCG/2ML IV SOLN
INTRAVENOUS | Status: AC
  Administered 2012-05-02: 3.5 ug via INTRAVENOUS
  Filled 2012-05-02: qty 2

## 2012-05-02 MED ORDER — GI COCKTAIL ~~LOC~~
30.0000 mL | Freq: Once | ORAL | Status: AC
  Administered 2012-05-02: 30 mL via ORAL
  Filled 2012-05-02: qty 30

## 2012-05-02 MED ORDER — DARBEPOETIN ALFA-POLYSORBATE 40 MCG/0.4ML IJ SOLN
40.0000 ug | INTRAMUSCULAR | Status: DC
  Administered 2012-05-02: 40 ug via INTRAVENOUS
  Filled 2012-05-02: qty 0.4

## 2012-05-02 MED ORDER — AMLODIPINE BESYLATE 10 MG PO TABS
10.0000 mg | ORAL_TABLET | Freq: Every day | ORAL | Status: DC
  Administered 2012-05-02: 10 mg via ORAL
  Filled 2012-05-02 (×2): qty 1

## 2012-05-02 MED ORDER — DIPHENHYDRAMINE HCL 25 MG PO CAPS
25.0000 mg | ORAL_CAPSULE | Freq: Four times a day (QID) | ORAL | Status: DC | PRN
  Administered 2012-05-02: 25 mg via ORAL

## 2012-05-02 MED ORDER — PENTAFLUOROPROP-TETRAFLUOROETH EX AERO: 1.0000 "application " | INHALATION_SPRAY | CUTANEOUS | Status: DC | PRN

## 2012-05-02 MED ORDER — POLYSACCHARIDE IRON COMPLEX 150 MG PO CAPS
150.0000 mg | ORAL_CAPSULE | Freq: Two times a day (BID) | ORAL | Status: DC
  Administered 2012-05-02: 150 mg via ORAL
  Filled 2012-05-02 (×4): qty 1

## 2012-05-02 MED ORDER — LIDOCAINE-PRILOCAINE 2.5-2.5 % EX CREA
1.0000 "application " | TOPICAL_CREAM | CUTANEOUS | Status: DC | PRN
  Filled 2012-05-02: qty 5

## 2012-05-02 MED ORDER — ASPIRIN EC 81 MG PO TBEC
81.0000 mg | DELAYED_RELEASE_TABLET | Freq: Every day | ORAL | Status: DC
Start: 2012-05-02 — End: 2012-05-03
  Administered 2012-05-02 – 2012-05-03 (×2): 81 mg via ORAL
  Filled 2012-05-02 (×2): qty 1

## 2012-05-02 MED ORDER — FERUMOXYTOL INJECTION 510 MG/17 ML
510.0000 mg | Freq: Once | INTRAVENOUS | Status: AC
  Administered 2012-05-02: 510 mg via INTRAVENOUS
  Filled 2012-05-02: qty 17

## 2012-05-02 MED ORDER — SODIUM CHLORIDE 0.9 % IJ SOLN
3.0000 mL | Freq: Two times a day (BID) | INTRAMUSCULAR | Status: DC
  Administered 2012-05-02 – 2012-05-03 (×2): 3 mL via INTRAVENOUS

## 2012-05-02 MED ORDER — DOXERCALCIFEROL 4 MCG/2ML IV SOLN
3.5000 ug | INTRAVENOUS | Status: DC
  Administered 2012-05-02: 3.5 ug via INTRAVENOUS
  Filled 2012-05-02: qty 2

## 2012-05-02 MED ORDER — AMLODIPINE BESYLATE 10 MG PO TABS
10.0000 mg | ORAL_TABLET | Freq: Every day | ORAL | Status: DC
  Filled 2012-05-02: qty 1

## 2012-05-02 MED ORDER — LABETALOL HCL 5 MG/ML IV SOLN: 10.0000 mg | Freq: Once | INTRAVENOUS | Status: DC

## 2012-05-02 MED ORDER — NITROGLYCERIN 0.4 MG SL SUBL: 0.4000 mg | SUBLINGUAL_TABLET | SUBLINGUAL | Status: DC | PRN

## 2012-05-02 NOTE — Progress Notes (Signed)
Pt just got back from stat CT, Norsvac 10 mg given, B/P 180/74. B/P will be reassessed by day shift nurse.

## 2012-05-02 NOTE — ED Provider Notes (Addendum)
History     CSN: [id number]  Arrival date & time 05/02/12  0121   First MD Initiated Contact with Patient 05/02/12 0131      Chief Complaint  Patient presents with  . Chest Pain    (Consider location/radiation/quality/duration/timing/severity/associated sxs/prior treatment) Patient is a 71 y.o. female presenting with chest pain. The history is provided by the patient.  Chest Pain The chest pain began 3 - 5 days ago. Duration of episode(s) is 2 minutes. Chest pain occurs intermittently. The chest pain is resolved. At its most intense, the pain is at 5/10. The pain is currently at 0/10. The severity of the pain is mild. The quality of the pain is described as aching. The pain does not radiate. Pertinent negatives for primary symptoms include no fever, no fatigue, no syncope, no shortness of breath, no cough, no wheezing, no palpitations and no abdominal pain. She tried nitroglycerin and aspirin for the symptoms.  Her past medical history is significant for CAD and hypertension. Procedure history comments: pt wtih stress test yest and [facility],  negative.  Hx of three stents placed.     Past Medical History  Diagnosis Date  . ESRD (end stage renal disease) on dialysis 12/15/11  . Essential hypertension, benign   . Chronic bronchitis   . Pneumonia   . Anemia   . History of blood transfusion 07/2011    "first time"  . GERD (gastroesophageal reflux disease)   . History of stomach ulcers   . History of lower GI bleeding   . Arthritis     "hands and feet"  . History of gout   . Anxiety   . Ovarian cancer 1992  . Colon cancer   . Coronary atherosclerosis of native coronary artery     DES circ and diag 6/13, DES RCA 7/13, LVEF 55%  . NSTEMI (non-ST elevated myocardial infarction)     Past Surgical History  Procedure Date  . Coronary angioplasty with stent placement 12/15/11    "2"  . Abdominal hysterectomy 1992  . Appendectomy 06/1990  . Tubal ligation 1980's  . Av  fistula placement 07/2009    left upper arm  . Thrombectomy / arteriovenous graft revision     left upper arm  . Colon resection 1992  . Esophagogastroduodenoscopy 01/20/2012    Procedure: ESOPHAGOGASTRODUODENOSCOPY (EGD);  Surgeon: Ladene Artist, MD,FACG;  Location: [facility] ENDOSCOPY;  Service: Endoscopy;  Laterality: N/A;    No family history on file.  History  Substance Use Topics  . Smoking status: Never Smoker   . Smokeless tobacco: Never Used  . Alcohol Use: No    OB History    Grav Para Term Preterm Abortions TAB SAB Ect Mult Living                  Review of Systems  Constitutional: Negative for fever and fatigue.  Respiratory: Negative for cough, shortness of breath and wheezing.   Cardiovascular: Positive for chest pain. Negative for palpitations and syncope.  Gastrointestinal: Negative for abdominal pain.  All other systems reviewed and are negative.    Allergies  Aspirin; Contrast media; Macrodantin; Penicillins; Bactrim; Sulfa antibiotics; and Plavix  Home Medications   Current Outpatient Rx  Name Route Sig Dispense Refill  . ALPRAZOLAM 0.25 MG PO TABS Oral Take 0.25-0.5 mg by mouth at bedtime as needed. For anxiety.    . AMLODIPINE BESYLATE 10 MG PO TABS Oral Take 10 mg by mouth daily.    . ASPIRIN  EC 81 MG PO TBEC Oral Take 81 mg by mouth daily.    . ATORVASTATIN CALCIUM 20 MG PO TABS Oral Take 20 mg by mouth daily.    Marland Kitchen CLONIDINE HCL 0.2 MG PO TABS Oral Take 0.1 mg by mouth 2 (two) times daily as needed.     Marland Kitchen DM-GUAIFENESIN ER 60-1200 MG PO TB12 Oral Take 1 tablet by mouth 2 (two) times daily as needed.    Marland Kitchen DIALYVITE 3000 3 MG PO TABS Oral Take 1 tablet by mouth daily.    Marland Kitchen POLYSACCHARIDE IRON COMPLEX 150 MG PO CAPS Oral Take 150 mg by mouth 2 (two) times daily.    Marland Kitchen LABETALOL HCL 200 MG PO TABS Oral Take 2 tablets (400 mg total) by mouth 2 (two) times daily. START ON 02/03/12 AND STOP METOPROLOL TARTRATE 120 tablet 3    D/C METOPROLOL TARTRATE ON  02/02/12 AFTER 2ND DOSE .Marland Kitchen.  . LANTHANUM CARBONATE 500 MG PO CHEW Oral Chew 1,000 mg by mouth 3 (three) times daily with meals.    Marland Kitchen LISINOPRIL 10 MG PO TABS Oral Take 1 tablet by mouth Daily.    Marland Kitchen LORATADINE 10 MG PO TABS Oral Take 10 mg by mouth daily as needed. For allergies.    Marland Kitchen NITROGLYCERIN 0.4 MG SL SUBL Sublingual Place 0.4 mg under the tongue every 5 (five) minutes as needed. For chest pain.    Marland Kitchen PANTOPRAZOLE SODIUM 20 MG PO TBEC Oral Take 2 tablets (40 mg total) by mouth 2 (two) times daily. 120 tablet 3  . TICAGRELOR 90 MG PO TABS Oral Take 90 mg by mouth 2 (two) times daily.      BP 194/81  Pulse 76  Temp 98 F (36.7 C) (Oral)  Resp 16  Ht 5\' 1"  (1.549 m)  Wt 138 lb (62.596 kg)  BMI 26.07 kg/m2  SpO2 95%  Physical Exam  Constitutional: She is oriented to person, place, and time. She appears well-developed and well-nourished.  HENT:  Head: Normocephalic and atraumatic.  Eyes: Conjunctivae normal and EOM are normal. Pupils are equal, round, and reactive to light.  Neck: Normal range of motion.  Cardiovascular: Normal rate, regular rhythm and normal heart sounds.   Pulmonary/Chest: Effort normal and breath sounds normal.  Abdominal: Soft. Bowel sounds are normal.  Musculoskeletal: Normal range of motion.  Neurological: She is alert and oriented to person, place, and time.  Skin: Skin is warm and dry.  Psychiatric: She has a normal mood and affect. Her behavior is normal.    ED Course  Procedures (including critical care time)   Labs Reviewed  CBC  URINALYSIS, ROUTINE W REFLEX MICROSCOPIC   No results found.   No diagnosis found.   05/02/2012  Date: 05/02/2012  Rate: 74  Rhythm: normal sinus rhythm  QRS Axis: normal  Intervals: normal  ST/T Wave abnormalities: nonspecific ST changes  Conduction Disutrbances:none  Narrative Interpretation:   Old EKG Reviewed: unchanged  MDM  + chest pain.  dced from outside hospital today with neg stress ce .   states pain began again tonight and came to ed for further evalaution   Discussed with turner,  Labauer.  Advises to admit .  Discussed with hospitalist,  Will admit for obs     Mayola Mcbain Ferne Reus, MD 05/02/12 [place], MD 05/02/12 [phone]

## 2012-05-02 NOTE — Progress Notes (Signed)
[facility] PROGRESS NOTE  Shawna [name] [id number] DOB: Apr 10, 1941 DOA: 05/02/2012 PCP: [name], MD  Assessment/Plan: 1.Chest pain: somewhat improved this am. Neg cardiac work up [place] 05/01/12 with reportedly neg stress test. Hx NSTEMI 7/13.  CT angio with ground-glass infiltrates in the dependent lung bilaterally with small pleural effusions. The findings suggest congestive heart failure with pulmonary edema. Will get echo. In 8/13 echo with 60-65% with dias disfunction grade. Will get strict intake output, daily wts. Wt today 63.5 up from 62.5kg on admission. Consider cardiology consult. For dialysis today  2. ESRD. MWF dialysis in [place]. Requested renal consult.  3. HTN: imdur increased on admission with better control. Will monitor. Continue meds  4. Anemia : likely of chronic disease. Chart review indicates range 7.6-9.1. curriently 11. Probably concentrated. No s/sx bleeding.   Code Status:  Family Communication: husband at bedside Disposition Plan: home with husband when medically stable.   Consultants:  renal  Procedures:  dialysis  Antibiotics:  none  HPI/Subjective: Sitting up in bed eating breakfast. Reports improvement with CP but still present. Denies any other discomfort.   Objective: Filed Vitals:   05/02/12 0215 05/02/12 0345 05/02/12 0430 05/02/12 0533  BP: 158/71 184/96 156/72 178/80  Pulse:  92 71 74  Temp:    97.9 F (36.6 C)  TempSrc:    Oral  Resp: 16 13 13 16   Height:    5\' 1"  (1.549 m)  Weight:    63.5 kg (139 lb 15.9 oz)  SpO2:  94% 98% 99%   No intake or output data in the 24 hours ending 05/02/12 0812 Filed Weights   05/02/12 0129 05/02/12 0533  Weight: 62.596 kg (138 lb) 63.5 kg (139 lb 15.9 oz)    Exam:   General:  Awake alert oriented x3  Cardiovascular:RRR No MGR no LEE PPP  Respiratory: normal effort BSCTAB No wheeze/rhonci  Abdomen: round soft +BS non-tender to palpation. No mass noted  Data  Reviewed: Basic Metabolic Panel:  Lab [id number] 0208  NA 139  K 4.9  CL 100  CO2 --  GLUCOSE 123*  BUN 47*  CREATININE 8.00*  CALCIUM --  MG --  PHOS --   Liver Function Tests: No results found for this basename: AST:5,ALT:5,ALKPHOS:5,BILITOT:5,PROT:5,ALBUMIN:5 in the last 168 hours No results found for this basename: LIPASE:5,AMYLASE:5 in the last 168 hours No results found for this basename: AMMONIA:5 in the last 168 hours CBC:  Lab 05/02/12 0208 05/02/12 0153  WBC -- 8.2  NEUTROABS -- --  HGB 12.6 11.1*  HCT 37.0 35.9*  MCV -- 87.1  PLT -- 300   Cardiac Enzymes:  Lab 05/02/12 0615  CKTOTAL --  CKMB --  CKMBINDEX --  TROPONINI <0.30   BNP (last 3 results)  Basename 01/09/12 1528  PROBNP 9339.0*   CBG: No results found for this basename: GLUCAP:5 in the last 168 hours  No results found for this or any previous visit (from the past 240 hour(s)).   Studies: Dg Chest 2 View  05/02/2012  *RADIOLOGY REPORT*  Clinical Data: Chest pain.  CHEST - 2 VIEW  Comparison: 04/27/2012  Findings: Two views of the chest were obtained.  Prominent markings in the lower lungs appear chronic.  These findings are unchanged. No evidence for new airspace disease or edema.  Heart size is within normal limits.  IMPRESSION: Chronic lung changes without acute findings.   Original Report Authenticated By: Markus Daft, M.D.    Ct Chest Wo Contrast  05/02/2012  *  RADIOLOGY REPORT*  Clinical Data: Chest pain  CT CHEST WITHOUT CONTRAST  Technique:  Multidetector CT imaging of the chest was performed following the standard protocol without IV contrast.  Comparison: 05/02/2012  Findings: Ground-glass type infiltrates are noted in the dependent lung bilaterally involving the upper and lower lobes.  This was not present on the CT of 01/16/2012.  There are small  pleural effusions bilaterally with thickening of the fissure and small layering effusions posteriorly.  The findings are most suggestive of  congestive heart failure with edema.  Peribronchial thickening is present, unchanged from the prior study compatible chronic lung disease.  There is coronary artery calcification.  I cannot evaluate for pulmonary embolism or aortic dissection without intravenous contrast.  No mass or adenopathy. Right upper pole renal cyst measuring 3.5 cm.  IMPRESSION: Ground-glass infiltrates in the dependent lung bilaterally with small pleural effusions.  The findings suggest congestive heart failure with pulmonary edema.   Original Report Authenticated By: Truett Perna, M.D.     Scheduled Meds:   . amLODipine  10 mg Oral Daily  . aspirin EC  81 mg Oral Daily  . atorvastatin  10 mg Oral Daily  . folic acid  1 mg Oral Daily  . gi cocktail  30 mL Oral Once  . iron polysaccharides  150 mg Oral BID  . isosorbide mononitrate  60 mg Oral Daily  . labetalol  400 mg Oral BID  . lanthanum  1,000 mg Oral TID WC  . lisinopril  10 mg Oral Daily  . sodium chloride  3 mL Intravenous Q12H  . sodium chloride  3 mL Intravenous Q12H  . Ticagrelor  90 mg Oral BID  . DISCONTD: amLODipine  10 mg Oral Daily  . DISCONTD: labetalol  10 mg Intravenous Once   Continuous Infusions:   Principal Problem:  *Chest pain Active Problems:  Coronary atherosclerosis of native coronary artery  Essential hypertension, benign  ESRD (end stage renal disease)    Time spent: 30 minutes    [facility]  If 8PM-8AM, please contact night-coverage at [email or web address], password [facility] 05/02/2012, 8:12 AM  LOS: 0 days

## 2012-05-02 NOTE — Progress Notes (Signed)
Utilization Review Completed.  Keeon Zurn, [place] T  05/02/2012

## 2012-05-02 NOTE — ED Notes (Signed)
Pt to ED c/o "pain that shoots across my chest".  She was just discharged from [facility] today for chest pain.  When she went to bed this evening her chest began hurting again.  Pt took 3 nitro which relieved the pain temporarily and 2 81 mg asa.  Pt hypertensive.

## 2012-05-02 NOTE — Progress Notes (Addendum)
[name]        RENAL CONSULT   Reason for Consult:  Dialysis patient (today is dialysis day) admitted with chest pain  Referring Physician: Dr. Jenne Pane  Shawna [name] is a 71 y.o. B woman  admitted with chest pain. She was discharged from [facility] yesterday after undergoing evaluation for chest pain. She purportedly had a negative stress test. She developed substernal and back pain last night and was admitted to [facility].  She has end-stage renal disease and has been on dialysis since May of 2011. Etiology of the ESRD is hypertension. Her dialysis orders are as follows: 4 hours, EDW 61.5 KG, 2K, 2.5 calcium, heparin 2000, 300 BFR, 600 GFR, EPO 15,000, Hectorol 3.5 mcg, she is iron deficient and has allergy to Venofer according to nurse Edman Circle, RN) at [facility] dialysis unit.   Past Medical History  Diagnosis Date  . ESRD (end stage renal disease) on dialysis 12/15/11  . Essential hypertension, benign   . Chronic bronchitis   . Pneumonia   . Anemia   . History of blood transfusion 07/2011    "first time"  . GERD (gastroesophageal reflux disease)   . History of stomach ulcers   . History of lower GI bleeding   . Arthritis     "hands and feet"  . History of gout   . Anxiety   . Ovarian cancer 1992  . Colon cancer   . Coronary atherosclerosis of native coronary artery     DES circ and diag 6/13, DES RCA 7/13, LVEF 55%  . NSTEMI (non-ST elevated myocardial infarction)   Appendectomy 1992  Family history : Father died age 77 of "heart," mother died age 58--she had diabetes and hypertension. She has 2 brothers and 2 sisters--her sisters have diabetes mellitus. She has 2 daughters who are healthy  Social History: Born and grew up in [place], [place], graduated from [employer]. 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 Aspirin Other (See Comments)    Mess up her stomach; "makes my bowels have blood in them"  . Contrast Media (Iodinated Diagnostic Agents) Itching  . Macrodantin (Nitrofurantoin Macrocrystal) Other (See Comments)    "broke me out in big old knots all over my body; had to go to ER"  . Penicillins Other (See Comments)    "makes me real weak when I take it; like I'll pass out"  . Bactrim (Sulfamethoxazole W-Trimethoprim) Rash  . Sulfa Antibiotics Rash  . Plavix (Clopidogrel Bisulfate) Rash    Medications:  .Scheduled:   . amLODipine  10 mg Oral Daily  . aspirin EC  81 mg Oral Daily  . atorvastatin  10 mg Oral Daily  . folic acid  1 mg Oral Daily  . gi cocktail  30 mL Oral Once  . iron polysaccharides  150 mg Oral BID  . isosorbide mononitrate  60 mg Oral Daily  . labetalol  400 mg Oral BID  . lanthanum  1,000 mg Oral TID WC  . lisinopril  10 mg Oral Daily  . sodium chloride  3 mL Intravenous Q12H  . sodium chloride  3 mL Intravenous Q12H  . Ticagrelor  90 mg Oral BID  . DISCONTD: amLODipine  10 mg Oral Daily  .  DISCONTD: labetalol  10 mg Intravenous Once  Imdur 30 mg/D. Nephro-Vite one/D.     ROS-  no claudication, no melena, no hematochezia, no gross hematuria, no renal colic, no nocturia, no doe, no orthopnea, no purulent sputum, no hemoptysis, no weight gain or weight loss, no cold or heat intolerance.   Blood pressure 178/80, pulse 74, temperature 97.9 F (36.6 C), temperature source Oral, resp. rate 16, height 5\' 1"  (1.549 m), weight 63.5 kg (139 lb 15.9 oz), SpO2 99.00%. Head-no trauma, nose mouth and pharynx with moist mucous membranes. Teeth in good shape Neck-no bruits Chest-no crackles Heart-no rub is heard, no murmur is heard Abdomen-periumbilical scar and scar from umbilicus to pubis, nontender, no organs or masses are felt, bowel sounds present Extremities-no edema, no rash, no arthritis, AV fistula  patent left upper arm Neuro-right-handed, strength equal, sensation intact  Lab:  Hemoglobin 11.1, WBC 8200, platelets 300 K. Sodium 139, potassium 4.9, chloride 100, BUN 47, creatinine 8, glucose 123, troponin I < than 0.3  CT Chest: Small pleural effusions bilaterally, groundglass infiltration dependent lung. No evidence of aortic dissection  Chest X. Ray: "Chronic lung changes without acute findings"    Assessment/Plan: 1. Chest pain-? etiology. Evaluation underway 2. ESRD-dialyze today. Her weight is 63.5 kg (EDW. 61.5 kg). Will try for 60 kg in HD today. Continue EPO and        Hectorol. Will use Feraheme for iron deficiency10/23/2013, 8:15 AM  3.  Hypertension--continue current meds  4. Gout-continue allopurinol Kandra Graven F 8:15 AM

## 2012-05-02 NOTE — ED Notes (Signed)
Pt presented to ED with chest pain.At present painfree.

## 2012-05-02 NOTE — H&P (Signed)
PCP:   [name], MD   Chief Complaint:  cp  HPI: 71 yo female esrd dialysis dep comes in with several days of sharp cp that radiates to her back that lasts about 15 minutes then goes away.  Happens at rest.  Was d/c from [facility] yesterday and had a NORMAL stress test per pt report.  No le edema.  No sob.  No fever or cough.  Compliant with dialysis.  Review of Systems:  O/w neg  Past Medical History: Past Medical History  Diagnosis Date  . ESRD (end stage renal disease) on dialysis 12/15/11  . Essential hypertension, benign   . Chronic bronchitis   . Pneumonia   . Anemia   . History of blood transfusion 07/2011    "first time"  . GERD (gastroesophageal reflux disease)   . History of stomach ulcers   . History of lower GI bleeding   . Arthritis     "hands and feet"  . History of gout   . Anxiety   . Ovarian cancer 1992  . Colon cancer   . Coronary atherosclerosis of native coronary artery     DES circ and diag 6/13, DES RCA 7/13, LVEF 55%  . NSTEMI (non-ST elevated myocardial infarction)    Past Surgical History  Procedure Date  . Coronary angioplasty with stent placement 12/15/11    "2"  . Abdominal hysterectomy 1992  . Appendectomy 06/1990  . Tubal ligation 1980's  . Av fistula placement 07/2009    left upper arm  . Thrombectomy / arteriovenous graft revision     left upper arm  . Colon resection 1992  . Esophagogastroduodenoscopy 01/20/2012    Procedure: ESOPHAGOGASTRODUODENOSCOPY (EGD);  Surgeon: Ladene Artist, MD,FACG;  Location[facility] ENDOSCOPY;  Service: Endoscopy;  Laterality: N/A;    Medications: Prior to Admission medications   Medication Sig Start Date End Date Taking? Authorizing Provider  ALPRAZolam (XANAX) 0.25 MG tablet Take 0.25-0.5 mg by mouth at bedtime as needed. For anxiety.   Yes Historical Provider, MD  amLODipine (NORVASC) 10 MG tablet Take 10 mg by mouth daily.   Yes Historical Provider, MD  aspirin EC 81 MG tablet Take 81 mg by mouth  daily.   Yes Historical Provider, MD  atorvastatin (LIPITOR) 10 MG tablet Take 10 mg by mouth daily.   Yes Historical Provider, MD  cloNIDine (CATAPRES) 0.2 MG tablet Take 0.2 mg by mouth 2 (two) times daily as needed.  01/13/12  Yes Historical Provider, MD  folic acid (FOLVITE) 1 MG tablet Take 1 mg by mouth daily.   Yes Historical Provider, MD  folic acid-vitamin b complex-vitamin c-selenium-zinc (DIALYVITE) 3 MG TABS Take 1 tablet by mouth daily.   Yes Historical Provider, MD  iron polysaccharides (NIFEREX) 150 MG capsule Take 150 mg by mouth 2 (two) times daily.   Yes Historical Provider, MD  isosorbide mononitrate (IMDUR) 30 MG 24 hr tablet Take 30 mg by mouth daily.   Yes Historical Provider, MD  labetalol (NORMODYNE) 200 MG tablet Take 2 tablets (400 mg total) by mouth 2 (two) times daily. START ON 02/03/12 AND STOP METOPROLOL TARTRATE 02/02/12  Yes Satira Sark, MD  lanthanum (FOSRENOL) 500 MG chewable tablet Chew 1,000 mg by mouth 3 (three) times daily with meals.   Yes Historical Provider, MD  lisinopril (PRINIVIL,ZESTRIL) 10 MG tablet Take 1 tablet by mouth Daily. 02/13/12  Yes Historical Provider, MD  loratadine (CLARITIN) 10 MG tablet Take 10 mg by mouth daily as needed.  For allergies.   Yes Historical Provider, MD  nitroGLYCERIN (NITROSTAT) 0.4 MG SL tablet Place 0.4 mg under the tongue every 5 (five) minutes as needed. For chest pain.   Yes Historical Provider, MD  pantoprazole (PROTONIX) 20 MG tablet Take 2 tablets (40 mg total) by mouth 2 (two) times daily. 01/25/12  Yes Brooke O Edmisten, PA-C  Ticagrelor (BRILINTA) 90 MG TABS tablet Take 90 mg by mouth 2 (two) times daily.   Yes Historical Provider, MD    Allergies:   Allergies  Allergen Reactions  . Aspirin Other (See Comments)    Mess up her stomach; "makes my bowels have blood in them"  . Contrast Media (Iodinated Diagnostic Agents) Itching  . Macrodantin (Nitrofurantoin Macrocrystal) Other (See Comments)    "broke me out  in big old knots all over my body; had to go to ER"  . Penicillins Other (See Comments)    "makes me real weak when I take it; like I'll pass out"  . Bactrim (Sulfamethoxazole W-Trimethoprim) Rash  . Sulfa Antibiotics Rash  . Plavix (Clopidogrel Bisulfate) Rash    Social History:  reports that she has never smoked. She has never used smokeless tobacco. She reports that she does not drink alcohol or use illicit drugs.  Family History: neg  Physical Exam: Filed Vitals:   05/02/12 0129  BP: 194/81  Pulse: 76  Temp: 98 F (36.7 C)  TempSrc: Oral  Resp: 16  Height: 5\' 1"  (1.549 m)  Weight: 62.596 kg (138 lb)  SpO2: 95%   General appearance: alert, cooperative and no distress Neck: no JVD and supple, symmetrical, trachea midline Heart: regular rate and rhythm, S1, S2 normal, no murmur, click, rub or gallop Abdomen: soft, non-tender; bowel sounds normal; no masses,  no organomegaly Extremities: extremities normal, atraumatic, no cyanosis or edema Pulses: 2+ and symmetric Skin: Skin color, texture, turgor normal. No rashes or lesions Neurologic: Grossly normal    Labs on Admission:   [facility] 05/02/12 0208  NA 139  K 4.9  CL 100  CO2 --  GLUCOSE 123*  BUN 47*  CREATININE 8.00*  CALCIUM --  MG --  PHOS --    Basename 05/02/12 0208 05/02/12 0153  WBC -- 8.2  NEUTROABS -- --  HGB 12.6 11.1*  HCT 37.0 35.9*  MCV -- 87.1  PLT -- 300   Radiological Exams on Admission: Dg Chest 2 View  05/02/2012  *RADIOLOGY REPORT*  Clinical Data: Chest pain.  CHEST - 2 VIEW  Comparison: 04/27/2012  Findings: Two views of the chest were obtained.  Prominent markings in the lower lungs appear chronic.  These findings are unchanged. No evidence for new airspace disease or edema.  Heart size is within normal limits.  IMPRESSION: Chronic lung changes without acute findings.   Original Report Authenticated By: Markus Daft, M.D.     Assessment/Plan Present on Admission:  71 yo female  with cp, esrd with neg cardiac w/u yesterday at [facility] .ESRD (end stage renal disease) .Chest pain .Coronary atherosclerosis of native coronary artery .Essential hypertension, benign  Pt states they did not do ct scan.  Will order one now to look for significant vascular problem.  Romi.  Tele.  Get records from [facility] with recent stress test yesterday.  Increase her imdur.  O/w no bleeding issues.  If all this is neg, will need gi referral probably.    Abimbola Aki A [id number] 05/02/2012, 3:39 AM

## 2012-05-02 NOTE — Progress Notes (Signed)
Patient seen, examined and discussed with my nurse practitioner. Patient is feeling overall better although she has stated that his chest pain has been going on for some time. In review of the records it looks as if cardiac disease has been ruled out. Given her grade 1 diastolic dysfunction, the patient is still able to make urine and would recommend diuretic. Her blood pressures are also not well-controlled which is likely contributing to some issues. We'll plan to monitor overnight, work on getting blood pressure down start diuretic and plan to discharge home tomorrow.

## 2012-05-02 NOTE — Progress Notes (Signed)
Pt B/P 178/80 manually upon arrival to floor, no prns, MD on call notified, gave order to give Norvasc 10 mg now. Will give and continue to monitor.

## 2012-05-03 DIAGNOSIS — I5032 Chronic diastolic (congestive) heart failure: Secondary | ICD-10-CM

## 2012-05-03 DIAGNOSIS — I2510 Atherosclerotic heart disease of native coronary artery without angina pectoris: Secondary | ICD-10-CM

## 2012-05-03 DIAGNOSIS — I509 Heart failure, unspecified: Secondary | ICD-10-CM

## 2012-05-03 LAB — CBC
HCT: 33.8 % — ABNORMAL LOW (ref 36.0–46.0)
Hemoglobin: 10.5 g/dL — ABNORMAL LOW (ref 12.0–15.0)
MCH: 27.1 pg (ref 26.0–34.0)
MCHC: 31.1 g/dL (ref 30.0–36.0)
MCV: 87.1 fL (ref 78.0–100.0)
RBC: 3.88 MIL/uL (ref 3.87–5.11)

## 2012-05-03 LAB — BASIC METABOLIC PANEL
BUN: 37 mg/dL — ABNORMAL HIGH (ref 6–23)
CO2: 27 mEq/L (ref 19–32)
Calcium: 9.7 mg/dL (ref 8.4–10.5)
GFR calc non Af Amer: 7 mL/min — ABNORMAL LOW (ref >90)
Glucose, Bld: 99 mg/dL (ref 70–99)

## 2012-05-03 MED ORDER — HEPARIN SODIUM (PORCINE) 1000 UNIT/ML DIALYSIS: 20.0000 [IU]/kg | INTRAMUSCULAR | Status: DC | PRN

## 2012-05-03 MED ORDER — CLONIDINE HCL 0.2 MG PO TABS: 0.2000 mg | ORAL_TABLET | Freq: Two times a day (BID) | ORAL | Status: DC

## 2012-05-03 MED ORDER — HYDROCHLOROTHIAZIDE 12.5 MG PO CAPS: 12.5000 mg | ORAL_CAPSULE | Freq: Every day | ORAL | Status: DC

## 2012-05-03 NOTE — Progress Notes (Signed)
Pt discharging home with husband.  To resume MWF HD schedule tomorrow.  Issue with questioned medication change addressed.  Instructions reviewed, questions answered, prescriptions given.

## 2012-05-03 NOTE — Discharge Summary (Addendum)
Physician Discharge Summary  Shawna [name] [id number] DOB: 1940/11/09 DOA: 05/02/2012  PCP: [name], MD  Admit date: 05/02/2012 Discharge date: 05/03/2012  Time spent: 45 minutes  Recommendations for Outpatient Follow-up:  1. Will discharge to home. Continue dialysis schedule. Follow up with PCP in 1-2 weeks  Discharge Diagnoses:  Principal Problem:  *Chest pain Active Problems:  Coronary atherosclerosis of native coronary artery  Essential hypertension, benign  ESRD (end stage renal disease)  Chronic diastolic CHF (congestive heart failure)   Discharge Condition: medically stable and ready for discharge.   Diet recommendation: renal  Filed Weights   05/02/12 0129 05/02/12 0533 05/02/12 1323  Weight: 62.596 kg (138 lb) 63.5 kg (139 lb 15.9 oz) 61.4 kg (135 lb 5.8 oz)    History of present illness:   Very pleasant 71 yo female hx esrd dialysis dep, HTN, anemia presented to ED on 05/02/12 with several days of sharp cp that radiates to her back that lasted about 15 minutes then went away. Happened at rest. Was d/c from [facility] 05/01/12 and had a NORMAL stress test per pt report. No le edema. No sob. No fever or cough. Compliant with dialysis. Admitted for work up     Hospital Course:  1.Chest pain: admitted to tele. Provided with 02 support. Chest pain gradually improved. At discharge little to no pain.  Neg cardiac work up [place] 05/01/12 with reportedly neg stress test. Hx NSTEMI 7/13. CT angio with ground-glass infiltrates in the dependent lung bilaterally with small pleural effusions. The findings suggest congestive heart failure with pulmonary edema. Given diuretic 05/03/12.  8/13 echo with 60-65% with dias disfunction grade. Wt on discharge 61.4kg down from 63.5kg on admission.   2. ESRD. MWF dialysis in [place]. Seen by renal and received dialysis on 05/02/12. Will continue schedule  3. HTN: imdur increased on admission with better control. Clonidine discontinued. At  discharge SBP range 136-176. Recommend PCP follow up 1 week to evaluate  4. Anemia : likely of chronic disease. Chart review indicates range 7.6-9.1. Hg 10.3 at discharge. No s/sx bleeding.     Procedures:  Dialysis 05/03/12  Consultations:  renal  Discharge Exam: Filed Vitals:   05/02/12 1416 05/02/12 2039 05/03/12 0629 05/03/12 1052  BP: 171/62 136/76 133/51 179/81  Pulse: 74 82 74 78  Temp: 97.9 F (36.6 C) 99.2 F (37.3 C) 97.6 F (36.4 C)   TempSrc: Oral Oral Oral   Resp: 17 20 18    Height:      Weight:      SpO2: 98% 97% 97%     General: awake alert ambulating in room Cardiovascular: RRR No MGR No LEE  Respiratory: normal effort BSCTAB no wheeze/crackles  Discharge Instructions  Discharge Orders    Future Appointments: Provider: Department: Dept Phone: Center:   05/23/2012 1:40 PM Aurora Mask, PA [facility] [phone] [facility]     Future Orders Please Complete By Expires   Diet - low sodium heart healthy      Increase activity slowly      Call MD for:  difficulty breathing, headache or visual disturbances      Call MD for:  persistant nausea and vomiting          Medication List     As of 05/03/2012 11:38 AM    TAKE these medications         ALPRAZolam 0.25 MG tablet   Commonly known as: XANAX   Take 0.25-0.5 mg by mouth at bedtime as needed. For  anxiety.      amLODipine 10 MG tablet   Commonly known as: NORVASC   Take 10 mg by mouth daily.      aspirin EC 81 MG tablet   Take 81 mg by mouth daily.      atorvastatin 10 MG tablet   Commonly known as: LIPITOR   Take 10 mg by mouth daily.      folic acid 1 MG tablet   Commonly known as: FOLVITE   Take 1 mg by mouth daily.      folic acid-vitamin b complex-vitamin c-selenium-zinc 3 MG Tabs   Take 1 tablet by mouth daily.      iron polysaccharides 150 MG capsule   Commonly known as: NIFEREX   Take 150 mg by mouth 2 (two) times daily.      isosorbide mononitrate 30 MG 24  hr tablet   Commonly known as: IMDUR   Take 30 mg by mouth daily.      labetalol 200 MG tablet   Commonly known as: NORMODYNE   Take 2 tablets (400 mg total) by mouth 2 (two) times daily. START ON 02/03/12 AND STOP METOPROLOL TARTRATE      lanthanum 500 MG chewable tablet   Commonly known as: FOSRENOL   Chew 1,000 mg by mouth 3 (three) times daily with meals.      lisinopril 10 MG tablet   Commonly known as: PRINIVIL,ZESTRIL   Take 1 tablet by mouth Daily.      loratadine 10 MG tablet   Commonly known as: CLARITIN   Take 10 mg by mouth daily as needed. For allergies.      nitroGLYCERIN 0.4 MG SL tablet   Commonly known as: NITROSTAT   Place 0.4 mg under the tongue every 5 (five) minutes as needed. For chest pain.      pantoprazole 20 MG tablet   Commonly known as: PROTONIX   Take 2 tablets (40 mg total) by mouth 2 (two) times daily.      Ticagrelor 90 MG Tabs tablet   Commonly known as: BRILINTA   Take 90 mg by mouth 2 (two) times daily.       cloNIDine 0.2 MG tablet  Commonly known as: CATAPRES  Take 0.2 mg by mouth 2 times daily    Hydrochlorothiazide 12.5 mg tablet Commonly known as: Microzide Take 12.5 mg by mouth once a day and     The results of significant diagnostics from this hospitalization (including imaging, microbiology, ancillary and laboratory) are listed below for reference.    Significant Diagnostic Studies: Dg Chest 2 View  05/02/2012  *RADIOLOGY REPORT*  Clinical Data: Chest pain.  CHEST - 2 VIEW  Comparison: 04/27/2012  Findings: Two views of the chest were obtained.  Prominent markings in the lower lungs appear chronic.  These findings are unchanged. No evidence for new airspace disease or edema.  Heart size is within normal limits.  IMPRESSION: Chronic lung changes without acute findings.   Original Report Authenticated By: Markus Daft, M.D.    Ct Chest Wo Contrast  05/02/2012   IMPRESSION: Ground-glass infiltrates in the dependent lung  bilaterally with small pleural effusions.  The findings suggest congestive heart failure with pulmonary edema.   Original Report Authenticated By: Truett Perna, M.D.     Microbiology: No results found for this or any previous visit (from the past 240 hour(s)).   Labs: Basic Metabolic Panel:  Lab 0000000 0435 05/02/12 0208  NA 139 139  K 4.8  4.9  CL 98 100  CO2 27 --  GLUCOSE 99 123*  BUN 37* 47*  CREATININE 5.62* 8.00*  CALCIUM 9.7 --  MG -- --  PHOS -- --   CBC:  Lab 05/03/12 0435 05/02/12 0208 05/02/12 0153  WBC 6.4 -- 8.2  NEUTROABS -- -- --  HGB 10.5* 12.6 11.1*  HCT 33.8* 37.0 35.9*  MCV 87.1 -- 87.1  PLT 277 -- 300   Cardiac Enzymes:  Lab 05/02/12 1726 05/02/12 1510 05/02/12 0615  CKTOTAL -- -- --  CKMB -- -- --  CKMBINDEX -- -- --  TROPONINI 0.47* 0.43* <0.30   BNP: BNP (last 3 results)  Basename 01/09/12 1528  PROBNP 9339.0*       Signed:  Radene Gunning NP [facility] 05/03/2012, 11:38 AM

## 2012-05-03 NOTE — Discharge Summary (Signed)
Patient seen, examined and discussed with my nurse practitioner. Her initial blood pressures this morning were in the 130s but has slowly creeped up over the course of the late morning into the 170s. Have decided to resume her clonidine 0.2 by mouth twice a day and because of times of volume overload and the fact that she can still make urine, have added a low-dose diuretic, hydrochlorothiazide 12.5 by mouth daily. Otherwise she is doing stable. Agree with above.

## 2012-05-03 NOTE — Progress Notes (Signed)
[facility]  Subjective:  Awake, alert, friendly, no chest pain today   Objective: Vital signs in last 24 hours: Blood pressure 133/51, pulse 74, temperature 97.6 F (36.4 C), temperature source Oral, resp. rate 18, height 5\' 1"  (1.549 m), weight 61.4 kg (135 lb 5.8 oz), SpO2 97.00%.    PHYSICAL EXAM General--Head-no trauma, nose mouth and pharynx with moist mucous membranes. Teeth in good shape  Neck-no bruits  Chest-no crackles  Heart-no rub is heard, no murmur is heard  Abdomen-periumbilical scar and scar from umbilicus to pubis, nontender, no organs or masses are felt, bowel sounds present  Extremities-no edema, no rash, no arthritis, AV fistula patent left upper arm  Neuro-right-handed, strength equal, sensation intact    Lab Results:   Lab 05/03/12 0435 05/02/12 0208  NA 139 139  K 4.8 4.9  CL 98 100  CO2 27 --  BUN 37* 47*  CREATININE 5.62* 8.00*  ALB -- --  GLUCOSE 99 --  CALCIUM 9.7 --  PHOS -- --     Basename 05/03/12 0435 05/02/12 0208 05/02/12 0153  WBC 6.4 -- 8.2  HGB 10.5* 12.6 --  HCT 33.8* 37.0 --  PLT 277 -- 300   I have reviewed the patient's current medications.  Assessment/Plan: 1. Chest pain-? etiology. Evaluation underway.  No dissection on CT scan  2. ESRD-dialyze today. Her weight was 61.4 kg post HD yesterday (EDW. 61.5 kg). Will try for 60 kg in HD tomorrow if she's still here. Continue EPO and Hectorol. Got Feraheme 510 mg yesterday for iron deficiency  3. Hypertension--continue current meds  4. Gout-continue allopurinol   Dry weight should probably be ~ 60 kg as as outpatient (pl eff  And "ground glass infiltrates" on CT from yesterday)  LOS: 1 day   Siler Mavis F 05/03/2012,8:59 AM   .labalb

## 2012-05-07 ENCOUNTER — Other Ambulatory Visit: Payer: Self-pay

## 2012-05-07 MED ORDER — PANTOPRAZOLE SODIUM 20 MG PO TBEC: 40.0000 mg | DELAYED_RELEASE_TABLET | Freq: Two times a day (BID) | ORAL | Status: DC

## 2012-05-07 NOTE — Telephone Encounter (Signed)
..   Requested Prescriptions   Signed Prescriptions Disp Refills  . pantoprazole (PROTONIX) 20 MG tablet 120 tablet 1    Sig: Take 2 tablets (40 mg total) by mouth 2 (two) times daily.    Authorizing Provider: Minus Breeding    Ordering User: Ardis Hughs, Ammi Hutt Jerilynn Mages

## 2012-05-23 ENCOUNTER — Encounter: Payer: Medicare Other | Admitting: Physician Assistant

## 2012-05-28 ENCOUNTER — Encounter: Payer: Medicare Other | Admitting: Physician Assistant

## 2012-05-29 ENCOUNTER — Encounter: Payer: Self-pay | Admitting: Physician Assistant

## 2012-05-29 ENCOUNTER — Encounter: Payer: Medicare Other | Admitting: Physician Assistant

## 2012-05-29 ENCOUNTER — Ambulatory Visit (INDEPENDENT_AMBULATORY_CARE_PROVIDER_SITE_OTHER): Payer: Medicare Other | Admitting: Physician Assistant

## 2012-05-29 VITALS — BP 131/68 | HR 86 | Ht 61.0 in | Wt 139.0 lb

## 2012-05-29 DIAGNOSIS — E785 Hyperlipidemia, unspecified: Secondary | ICD-10-CM

## 2012-05-29 DIAGNOSIS — N186 End stage renal disease: Secondary | ICD-10-CM

## 2012-05-29 DIAGNOSIS — I10 Essential (primary) hypertension: Secondary | ICD-10-CM

## 2012-05-29 DIAGNOSIS — I5032 Chronic diastolic (congestive) heart failure: Secondary | ICD-10-CM

## 2012-05-29 DIAGNOSIS — I2510 Atherosclerotic heart disease of native coronary artery without angina pectoris: Secondary | ICD-10-CM

## 2012-05-29 DIAGNOSIS — I509 Heart failure, unspecified: Secondary | ICD-10-CM

## 2012-05-29 MED ORDER — ISOSORBIDE MONONITRATE ER 60 MG PO TB24: 60.0000 mg | ORAL_TABLET | Freq: Every day | ORAL | Status: DC

## 2012-05-29 NOTE — Patient Instructions (Addendum)
   Increase Imdur to 60mg  daily (may take 2 of your 30mg  tabs daily till finish current supply) Continue all other current medications. Follow up in  2 weeks

## 2012-05-29 NOTE — Assessment & Plan Note (Signed)
Will increase Imdur to 60 mg daily, and schedule early followup with me in 2 weeks for continued close monitoring. If we're unable to control her CP with medications, then we will need to consider a repeat cardiac catheterization. The results of the recent pharmacologic nuclear imaging study did suggest mild peri-infarct ischemia. I suspect that her underlying anemia may be a precipitant, particularly when her hemoglobin drops quite low as it has in the recent past. She did note some transient gross hematuria yesterday, and reported this both to Drs. Nadara Mustard and [place]. She otherwise denies any other evidence of overt bleeding. She states that she has routine blood work drawn during dialysis.

## 2012-05-29 NOTE — Assessment & Plan Note (Signed)
Followed by Dr. Lowanda Foster

## 2012-05-29 NOTE — Assessment & Plan Note (Signed)
Patient appears euvolemic by history and exam. She is compliant with scheduled HD sessions, and is also on a diuretic.

## 2012-05-29 NOTE — Progress Notes (Signed)
Primary Cardiologist: Johnny Bridge, MD   HPI: Post hospital followup from [name], status post recent hospitalization on 2 separate occasions in October. In the first instance, she presented with a hemoglobin of 7.3 on admission, requiring PRBC transfusion. She did complain of LUE discomfort, and had nondiagnostic troponins in the 0.08-0.10 range. Medical management was recommended. She then returned shortly thereafter with complaint of refractory CP, but again with profound anemia with a hemoglobin of 6, again requiring transfusion. This time troponins were slightly more elevated, up to 0.18. In both cases, however, MB fraction was within normal limits. It was also noted that there were no significant EKG changes, and that the etiology of her CP was unclear. Nevertheless, Imdur 30 mg daily was added to her regimen.  A Lexiscan Cardiolite was also obtained on October 21, reviewed by Dr. Ron Parker, yielding a small inferobasal scar with mild peri-infarct ischemia, but no large area of ischemia; EF 50%.  Shortly after her second hospitalization here at [facility], she then presented directly to [facility] on October 23, again with complaint of CP. She was not seen by our cardiology team. Troponins were in the 0.4-0.5 range. A CT angiogram of the chest yielded bilateral groundglass infiltrates, with small pleural effusions suggestive of CHF. She underwent same-day HD, and was cleared for discharge the following day. Hemoglobin 10.3 at discharge.  She presents today reporting ongoing CP, occurring several times a week. She usually takes one NTG tablet with relief in 15-20 minutes. The symptoms are unpredictable in onset, and do not occur during HD or when ambulating. She denies any symptoms suggestive of CHF. She also noted some gross hematuria yesterday, which apparently resolved today.  Allergies  Allergen Reactions  . Aspirin Other (See Comments)    Mess up her stomach; "makes my bowels have blood in them"  . Contrast Media  (Iodinated Diagnostic Agents) Itching  . Iron Itching    "they gave me iron in dialysis; had to give me Benadryl cause I had to have the iron" (05/02/2012)  . Macrodantin (Nitrofurantoin Macrocrystal) Other (See Comments)    "broke me out in big old knots all over my body; had to go to ER"  . Penicillins Other (See Comments)    "makes me real weak when I take it; like I'll pass out"  . Plavix (Clopidogrel Bisulfate) Rash  . Bactrim (Sulfamethoxazole W-Trimethoprim) Rash  . Sulfa Antibiotics Rash  . Venofer (Ferric Oxide) Itching    Patient reports using Benadryl prior to doses as [facility]    Current Outpatient Prescriptions  Medication Sig Dispense Refill  . allopurinol (ZYLOPRIM) 300 MG tablet Take 300 mg by mouth daily as needed.      . ALPRAZolam (XANAX) 0.25 MG tablet Take 0.25-0.5 mg by mouth at bedtime as needed. For anxiety.      Marland Kitchen amLODipine (NORVASC) 10 MG tablet Take 10 mg by mouth daily.      Marland Kitchen aspirin EC 81 MG tablet Take 81 mg by mouth daily.      Marland Kitchen atorvastatin (LIPITOR) 10 MG tablet Take 10 mg by mouth daily.      . cloNIDine (CATAPRES) 0.2 MG tablet Take 1 tablet (0.2 mg total) by mouth 2 (two) times daily.  60 tablet  0  . fluticasone (FLONASE) 50 MCG/ACT nasal spray Place 2 sprays into the nose daily.      . folic acid (FOLVITE) 1 MG tablet Take 1 mg by mouth daily.      . folic acid-vitamin  b complex-vitamin c-selenium-zinc (DIALYVITE) 3 MG TABS Take 1 tablet by mouth daily.      . hydrochlorothiazide (MICROZIDE) 12.5 MG capsule Take 1 capsule (12.5 mg total) by mouth daily.  30 capsule  0  . isosorbide mononitrate (IMDUR) 60 MG 24 hr tablet Take 1 tablet (60 mg total) by mouth daily.  30 tablet  6  . labetalol (NORMODYNE) 200 MG tablet Take 200 mg by mouth 2 (two) times daily. START ON 02/03/12 AND STOP METOPROLOL TARTRATE      . lanthanum (FOSRENOL) 500 MG chewable tablet Chew 1,000 mg by mouth 3 (three) times daily with meals.      . lidocaine-prilocaine  (EMLA) cream Apply 1 application topically as needed.      Marland Kitchen lisinopril (PRINIVIL,ZESTRIL) 10 MG tablet Take 1 tablet by mouth Daily.      Marland Kitchen loratadine (CLARITIN) 10 MG tablet Take 10 mg by mouth daily as needed. For allergies.      . nitroGLYCERIN (NITROSTAT) 0.4 MG SL tablet Place 0.4 mg under the tongue every 5 (five) minutes as needed. For chest pain.      . pantoprazole (PROTONIX) 20 MG tablet Take 2 tablets (40 mg total) by mouth 2 (two) times daily.  120 tablet  1  . Ticagrelor (BRILINTA) 90 MG TABS tablet Take 90 mg by mouth 2 (two) times daily.      . traMADol (ULTRAM) 50 MG tablet Take 0.5 tablets by mouth Three times daily as needed.      . [DISCONTINUED] isosorbide mononitrate (IMDUR) 30 MG 24 hr tablet Take 30 mg by mouth daily.        Past Medical History  Diagnosis Date  . Essential hypertension, benign   . Chronic bronchitis     "last time 01/2012" (05/02/2012)  . GERD (gastroesophageal reflux disease)   . History of stomach ulcers   . History of lower GI bleeding   . Arthritis     "hands and feet"  . History of gout   . Ovarian cancer 1992  . Colon cancer   . Coronary atherosclerosis of native coronary artery     DES circ and diag 6/13, DES RCA 7/13, LVEF 55%  . High cholesterol 12/2011  . CHF (congestive heart failure)   . Anginal pain   . NSTEMI (non-ST elevated myocardial infarction) 12/2011  . Pneumonia ~ 2009  . Shortness of breath     "when heart acts up" (05/02/2012)  . Iron deficiency anemia   . TIA (transient ischemic attack)     "quite a few years ago" (05/02/2012)  . Anxiety   . History of blood transfusion 07/2011; 12/2011; 01/2012 X 2; 04/2012  . ESRD (end stage renal disease) on dialysis 12/15/11    "take dialysis @ [facility]; Mon, Wed, Fri) (05/02/2012)  . Carotid artery disease     [id number] LICA, [id number]   . Mitral regurgitation     Moderate, 02/2012    Past Surgical History  Procedure Date  . Abdominal hysterectomy 1992  . Appendectomy 06/1990    . Tubal ligation 1980's  . Av fistula placement 07/2009    left upper arm  . Thrombectomy / arteriovenous graft revision 2011    left upper arm  . Colon resection 1992  . Esophagogastroduodenoscopy 01/20/2012    Procedure: ESOPHAGOGASTRODUODENOSCOPY (EGD);  Surgeon: Ladene Artist, MD,FACG;  Location: [facility] ENDOSCOPY;  Service: Endoscopy;  Laterality: N/A;  . Dilation and curettage of uterus   . Colon surgery   .  Coronary angioplasty with stent placement 12/15/11    "2"  . Coronary angioplasty with stent placement y/2013    "1; makes total of 3" (05/02/2012)    History   Social History  . Marital Status: Married    Spouse Name: N/A    Number of Children: N/A  . Years of Education: N/A   Occupational History  . Not on file.   Social History Main Topics  . Smoking status: Never Smoker   . Smokeless tobacco: Never Used  . Alcohol Use: No  . Drug Use: No  . Sexually Active: Yes   Other Topics Concern  . Not on file   Social History Narrative  . No narrative on file    No family history on file.  ROS: no nausea, vomiting; no fever, chills; no melena, hematochezia; no claudication  PHYSICAL EXAM: BP 131/68  Pulse 86  Ht 5\' 1"  (1.549 m)  Wt 139 lb (63.05 kg)  BMI 26.26 kg/m2  SpO2 98% GENERAL: 71 year old female; NAD HEENT: NCAT, PERRLA, EOMI; sclera clear; no xanthelasma NECK: palpable bilateral carotid pulses, no bruits; no JVD; no TM LUNGS: CTA bilaterally CARDIAC: RRR (S1, S2); no significant murmurs; no rubs or gallops ABDOMEN: soft, non-tender; intact BS EXTREMETIES: no significant peripheral edema SKIN: warm/dry; no obvious rash/lesions MUSCULOSKELETAL: no joint deformity NEURO: no focal deficit; NL affect   EKG:    ASSESSMENT & PLAN:  Coronary atherosclerosis of native coronary artery Will increase Imdur to 60 mg daily, and schedule early followup with me in 2 weeks for continued close monitoring. If we're unable to control her CP with medications,  then we will need to consider a repeat cardiac catheterization. The results of the recent pharmacologic nuclear imaging study did suggest mild peri-infarct ischemia. I suspect that her underlying anemia may be a precipitant, particularly when her hemoglobin drops quite low as it has in the recent past. She did note some transient gross hematuria yesterday, and reported this both to Drs. Nadara Mustard and [place]. She otherwise denies any other evidence of overt bleeding. She states that she has routine blood work drawn during dialysis.  Chronic diastolic CHF (congestive heart failure) Patient appears euvolemic by history and exam. She is compliant with scheduled HD sessions, and is also on a diuretic.  ESRD (end stage renal disease) Followed by Dr. Lowanda Foster  Essential hypertension, benign Stable on current medication regimen  HLD (hyperlipidemia) Well-controlled on current dose Lipitor, with most recent LDL 58 this past August.    Mannie Stabile, PAC

## 2012-05-29 NOTE — Assessment & Plan Note (Signed)
Stable on current medication regimen 

## 2012-05-29 NOTE — Assessment & Plan Note (Signed)
Well-controlled on current dose Lipitor, with most recent LDL 58 this past August.

## 2012-05-31 ENCOUNTER — Ambulatory Visit: Payer: Medicare Other | Admitting: Cardiology

## 2012-06-01 ENCOUNTER — Telehealth: Payer: Self-pay | Admitting: *Deleted

## 2012-06-01 NOTE — Telephone Encounter (Signed)
Chart review actually finds that the patient was just seen in the office by Mr. Serpe on 11/19 and that Imdur was increased to 60 mg daily from 30 mg daily at that time. Not clear why Dr. Arlina Robes name is on the medication. I do not find nitrate allergy in the chart. Please review with Gene.

## 2012-06-01 NOTE — Telephone Encounter (Addendum)
Per nurse's message machine, received call from [facility] saying they received rx on 05/30/12 from [facility] for patient to be on imdur 60 mg and according to their record, this patient has an allergy to nitrates. Pharmacy calling to see if they should fill this prescription. After reviewing Chart Max, patient was seen in [facility] ED on 05/30/12.

## 2012-06-01 NOTE — Telephone Encounter (Signed)
Called pharmacy and verified who prescribed medication. Per Venora Maples at pharmacy, the rx was prescribed by Gene Serpe. Nurse advised pharmacist that patient had been on medication since hospital d/c without any problems and it was okay to fill the prescription.

## 2012-06-08 ENCOUNTER — Encounter (HOSPITAL_COMMUNITY): Payer: Self-pay | Admitting: Nurse Practitioner

## 2012-06-08 ENCOUNTER — Encounter (HOSPITAL_COMMUNITY)
Admission: AD | Disposition: A | Discharge status: Skilled Nursing Facility | Payer: Self-pay | Source: Other Acute Inpatient Hospital | Attending: Cardiothoracic Surgery

## 2012-06-08 ENCOUNTER — Inpatient Hospital Stay (HOSPITAL_COMMUNITY)
Admission: AD | Admit: 2012-06-08 | Discharge: 2012-06-20 | DRG: 233 | Disposition: A | Discharge status: Skilled Nursing Facility | Payer: Medicare Other | Source: Other Acute Inpatient Hospital | Attending: Cardiothoracic Surgery | Admitting: Cardiothoracic Surgery

## 2012-06-08 DIAGNOSIS — E7800 Pure hypercholesterolemia, unspecified: Secondary | ICD-10-CM | POA: Diagnosis present

## 2012-06-08 DIAGNOSIS — I214 Non-ST elevation (NSTEMI) myocardial infarction: Secondary | ICD-10-CM | POA: Diagnosis present

## 2012-06-08 DIAGNOSIS — I5033 Acute on chronic diastolic (congestive) heart failure: Secondary | ICD-10-CM | POA: Diagnosis present

## 2012-06-08 DIAGNOSIS — K219 Gastro-esophageal reflux disease without esophagitis: Secondary | ICD-10-CM | POA: Diagnosis present

## 2012-06-08 DIAGNOSIS — I2510 Atherosclerotic heart disease of native coronary artery without angina pectoris: Secondary | ICD-10-CM | POA: Diagnosis present

## 2012-06-08 DIAGNOSIS — I509 Heart failure, unspecified: Secondary | ICD-10-CM | POA: Diagnosis present

## 2012-06-08 DIAGNOSIS — Z7982 Long term (current) use of aspirin: Secondary | ICD-10-CM

## 2012-06-08 DIAGNOSIS — I999 Unspecified disorder of circulatory system: Secondary | ICD-10-CM | POA: Diagnosis not present

## 2012-06-08 DIAGNOSIS — N039 Chronic nephritic syndrome with unspecified morphologic changes: Secondary | ICD-10-CM | POA: Diagnosis present

## 2012-06-08 DIAGNOSIS — Y9210 Unspecified residential institution as the place of occurrence of the external cause: Secondary | ICD-10-CM | POA: Diagnosis not present

## 2012-06-08 DIAGNOSIS — Z8673 Personal history of transient ischemic attack (TIA), and cerebral infarction without residual deficits: Secondary | ICD-10-CM

## 2012-06-08 DIAGNOSIS — Z79899 Other long term (current) drug therapy: Secondary | ICD-10-CM

## 2012-06-08 DIAGNOSIS — R1030 Lower abdominal pain, unspecified: Secondary | ICD-10-CM

## 2012-06-08 DIAGNOSIS — Z85038 Personal history of other malignant neoplasm of large intestine: Secondary | ICD-10-CM

## 2012-06-08 DIAGNOSIS — M109 Gout, unspecified: Secondary | ICD-10-CM | POA: Diagnosis present

## 2012-06-08 DIAGNOSIS — Z9861 Coronary angioplasty status: Secondary | ICD-10-CM

## 2012-06-08 DIAGNOSIS — D631 Anemia in chronic kidney disease: Secondary | ICD-10-CM | POA: Diagnosis present

## 2012-06-08 DIAGNOSIS — K279 Peptic ulcer, site unspecified, unspecified as acute or chronic, without hemorrhage or perforation: Secondary | ICD-10-CM | POA: Diagnosis present

## 2012-06-08 DIAGNOSIS — F411 Generalized anxiety disorder: Secondary | ICD-10-CM | POA: Diagnosis present

## 2012-06-08 DIAGNOSIS — Z992 Dependence on renal dialysis: Secondary | ICD-10-CM | POA: Diagnosis present

## 2012-06-08 DIAGNOSIS — Y92009 Unspecified place in unspecified non-institutional (private) residence as the place of occurrence of the external cause: Secondary | ICD-10-CM

## 2012-06-08 DIAGNOSIS — T82897A Other specified complication of cardiac prosthetic devices, implants and grafts, initial encounter: Secondary | ICD-10-CM | POA: Diagnosis present

## 2012-06-08 DIAGNOSIS — I724 Aneurysm of artery of lower extremity: Secondary | ICD-10-CM | POA: Diagnosis not present

## 2012-06-08 DIAGNOSIS — N2581 Secondary hyperparathyroidism of renal origin: Secondary | ICD-10-CM | POA: Diagnosis present

## 2012-06-08 DIAGNOSIS — I120 Hypertensive chronic kidney disease with stage 5 chronic kidney disease or end stage renal disease: Secondary | ICD-10-CM | POA: Diagnosis present

## 2012-06-08 DIAGNOSIS — I059 Rheumatic mitral valve disease, unspecified: Secondary | ICD-10-CM | POA: Diagnosis present

## 2012-06-08 DIAGNOSIS — Z880 Allergy status to penicillin: Secondary | ICD-10-CM

## 2012-06-08 DIAGNOSIS — N186 End stage renal disease: Secondary | ICD-10-CM | POA: Diagnosis present

## 2012-06-08 DIAGNOSIS — I10 Essential (primary) hypertension: Secondary | ICD-10-CM | POA: Diagnosis present

## 2012-06-08 DIAGNOSIS — N390 Urinary tract infection, site not specified: Secondary | ICD-10-CM | POA: Diagnosis present

## 2012-06-08 DIAGNOSIS — Z8543 Personal history of malignant neoplasm of ovary: Secondary | ICD-10-CM

## 2012-06-08 DIAGNOSIS — D62 Acute posthemorrhagic anemia: Secondary | ICD-10-CM | POA: Diagnosis not present

## 2012-06-08 DIAGNOSIS — I252 Old myocardial infarction: Secondary | ICD-10-CM

## 2012-06-08 DIAGNOSIS — E785 Hyperlipidemia, unspecified: Secondary | ICD-10-CM | POA: Diagnosis present

## 2012-06-08 DIAGNOSIS — Z951 Presence of aortocoronary bypass graft: Secondary | ICD-10-CM

## 2012-06-08 DIAGNOSIS — Z7902 Long term (current) use of antithrombotics/antiplatelets: Secondary | ICD-10-CM

## 2012-06-08 DIAGNOSIS — J189 Pneumonia, unspecified organism: Secondary | ICD-10-CM | POA: Diagnosis present

## 2012-06-08 DIAGNOSIS — Y840 Cardiac catheterization as the cause of abnormal reaction of the patient, or of later complication, without mention of misadventure at the time of the procedure: Secondary | ICD-10-CM | POA: Diagnosis not present

## 2012-06-08 DIAGNOSIS — Z91041 Radiographic dye allergy status: Secondary | ICD-10-CM

## 2012-06-08 DIAGNOSIS — I340 Nonrheumatic mitral (valve) insufficiency: Secondary | ICD-10-CM | POA: Diagnosis present

## 2012-06-08 DIAGNOSIS — I6529 Occlusion and stenosis of unspecified carotid artery: Secondary | ICD-10-CM | POA: Diagnosis present

## 2012-06-08 DIAGNOSIS — Z888 Allergy status to other drugs, medicaments and biological substances status: Secondary | ICD-10-CM

## 2012-06-08 DIAGNOSIS — Z882 Allergy status to sulfonamides status: Secondary | ICD-10-CM

## 2012-06-08 HISTORY — DX: Chronic diastolic (congestive) heart failure: I50.32

## 2012-06-08 HISTORY — DX: Peptic ulcer, site unspecified, unspecified as acute or chronic, without hemorrhage or perforation: K27.9

## 2012-06-08 HISTORY — PX: LEFT HEART CATHETERIZATION WITH CORONARY ANGIOGRAM: SHX5451

## 2012-06-08 HISTORY — DX: Acute myocardial infarction, unspecified: I21.9

## 2012-06-08 LAB — CBC
HCT: 33.4 % — ABNORMAL LOW (ref 36.0–46.0)
MCV: 93.6 fL (ref 78.0–100.0)
Platelets: 358 10*3/uL (ref 150–400)
RBC: 3.57 MIL/uL — ABNORMAL LOW (ref 3.87–5.11)
RDW: 22.6 % — ABNORMAL HIGH (ref 11.5–15.5)
WBC: 11 10*3/uL — ABNORMAL HIGH (ref 4.0–10.5)

## 2012-06-08 LAB — POCT ACTIVATED CLOTTING TIME: Activated Clotting Time: 134 seconds

## 2012-06-08 SURGERY — LEFT HEART CATHETERIZATION WITH CORONARY ANGIOGRAM

## 2012-06-08 MED ORDER — LIDOCAINE-PRILOCAINE 2.5-2.5 % EX CREA
1.0000 "application " | TOPICAL_CREAM | CUTANEOUS | Status: DC | PRN
  Filled 2012-06-08: qty 5

## 2012-06-08 MED ORDER — NEPRO/CARBSTEADY PO LIQD
237.0000 mL | ORAL | Status: DC | PRN
  Filled 2012-06-08: qty 237

## 2012-06-08 MED ORDER — HYDROXYZINE HCL 25 MG PO TABS
ORAL_TABLET | ORAL | Status: AC
  Administered 2012-06-08: 25 mg via ORAL
  Filled 2012-06-08: qty 1

## 2012-06-08 MED ORDER — DOXERCALCIFEROL 4 MCG/2ML IV SOLN
INTRAVENOUS | Status: AC
  Filled 2012-06-08: qty 2

## 2012-06-08 MED ORDER — FLUTICASONE PROPIONATE 50 MCG/ACT NA SUSP
2.0000 | Freq: Every day | NASAL | Status: DC
  Administered 2012-06-08 – 2012-06-20 (×11): 2 via NASAL
  Filled 2012-06-08 (×2): qty 16

## 2012-06-08 MED ORDER — SORBITOL 70 % SOLN
30.0000 mL | Status: DC | PRN
  Filled 2012-06-08: qty 30

## 2012-06-08 MED ORDER — FENTANYL CITRATE 0.05 MG/ML IJ SOLN
INTRAMUSCULAR | Status: AC
  Filled 2012-06-08: qty 2

## 2012-06-08 MED ORDER — LISINOPRIL 10 MG PO TABS
10.0000 mg | ORAL_TABLET | Freq: Every day | ORAL | Status: DC
Start: 2012-06-08 — End: 2012-06-13
  Administered 2012-06-08 – 2012-06-11 (×4): 10 mg via ORAL
  Filled 2012-06-08 (×6): qty 1

## 2012-06-08 MED ORDER — CAMPHOR-MENTHOL 0.5-0.5 % EX LOTN
1.0000 "application " | TOPICAL_LOTION | Freq: Three times a day (TID) | CUTANEOUS | Status: DC | PRN
  Filled 2012-06-08 (×2): qty 222

## 2012-06-08 MED ORDER — NITROGLYCERIN IN D5W 200-5 MCG/ML-% IV SOLN
3.0000 ug/min | INTRAVENOUS | Status: DC
  Administered 2012-06-09: 25 ug/min via INTRAVENOUS
  Administered 2012-06-10 – 2012-06-13 (×3): 30 ug/min via INTRAVENOUS
  Filled 2012-06-08 (×3): qty 250

## 2012-06-08 MED ORDER — DARBEPOETIN ALFA-POLYSORBATE 100 MCG/0.5ML IJ SOLN
100.0000 ug | INTRAMUSCULAR | Status: DC
  Administered 2012-06-08: 100 ug via INTRAVENOUS

## 2012-06-08 MED ORDER — ONDANSETRON HCL 4 MG/2ML IJ SOLN: 4.0000 mg | Freq: Four times a day (QID) | INTRAMUSCULAR | Status: DC | PRN

## 2012-06-08 MED ORDER — LORATADINE 10 MG PO TABS
10.0000 mg | ORAL_TABLET | Freq: Every day | ORAL | Status: DC | PRN
  Administered 2012-06-08: 10 mg via ORAL
  Filled 2012-06-08 (×2): qty 1

## 2012-06-08 MED ORDER — PENTAFLUOROPROP-TETRAFLUOROETH EX AERO: 1.0000 "application " | INHALATION_SPRAY | CUTANEOUS | Status: DC | PRN

## 2012-06-08 MED ORDER — NITROGLYCERIN 0.4 MG SL SUBL: 0.4000 mg | SUBLINGUAL_TABLET | SUBLINGUAL | Status: DC | PRN

## 2012-06-08 MED ORDER — LANTHANUM CARBONATE 500 MG PO CHEW
1000.0000 mg | CHEWABLE_TABLET | Freq: Three times a day (TID) | ORAL | Status: DC
  Administered 2012-06-09 – 2012-06-20 (×26): 1000 mg via ORAL
  Filled 2012-06-08 (×40): qty 2

## 2012-06-08 MED ORDER — DIPHENHYDRAMINE HCL 50 MG/ML IJ SOLN
25.0000 mg | Freq: Four times a day (QID) | INTRAMUSCULAR | Status: DC | PRN
  Administered 2012-06-08 – 2012-06-11 (×2): 25 mg via INTRAVENOUS
  Filled 2012-06-08: qty 1

## 2012-06-08 MED ORDER — TICAGRELOR 90 MG PO TABS
90.0000 mg | ORAL_TABLET | Freq: Two times a day (BID) | ORAL | Status: DC
  Filled 2012-06-08 (×2): qty 1

## 2012-06-08 MED ORDER — ACETAMINOPHEN 325 MG PO TABS: 650.0000 mg | ORAL_TABLET | Freq: Four times a day (QID) | ORAL | Status: DC | PRN

## 2012-06-08 MED ORDER — DIPHENHYDRAMINE HCL 50 MG/ML IJ SOLN
INTRAMUSCULAR | Status: AC
  Filled 2012-06-08: qty 1

## 2012-06-08 MED ORDER — ZOLPIDEM TARTRATE 5 MG PO TABS: 5.0000 mg | ORAL_TABLET | Freq: Every evening | ORAL | Status: DC | PRN

## 2012-06-08 MED ORDER — ONDANSETRON HCL 4 MG PO TABS: 4.0000 mg | ORAL_TABLET | Freq: Four times a day (QID) | ORAL | Status: DC | PRN

## 2012-06-08 MED ORDER — PANTOPRAZOLE SODIUM 40 MG PO TBEC
40.0000 mg | DELAYED_RELEASE_TABLET | Freq: Two times a day (BID) | ORAL | Status: DC
  Administered 2012-06-08 – 2012-06-12 (×10): 40 mg via ORAL
  Filled 2012-06-08 (×10): qty 1

## 2012-06-08 MED ORDER — DIPHENHYDRAMINE HCL 50 MG/ML IJ SOLN
25.0000 mg | INTRAMUSCULAR | Status: AC
  Administered 2012-06-08: 25 mg via INTRAVENOUS
  Filled 2012-06-08: qty 2
  Filled 2012-06-08: qty 1

## 2012-06-08 MED ORDER — ACETAMINOPHEN 325 MG PO TABS
ORAL_TABLET | ORAL | Status: AC
  Filled 2012-06-08: qty 2

## 2012-06-08 MED ORDER — LIDOCAINE HCL (PF) 1 % IJ SOLN: 5.0000 mL | INTRAMUSCULAR | Status: DC | PRN

## 2012-06-08 MED ORDER — ACETAMINOPHEN 325 MG PO TABS
650.0000 mg | ORAL_TABLET | ORAL | Status: DC | PRN
  Administered 2012-06-08: 650 mg via ORAL

## 2012-06-08 MED ORDER — HEPARIN (PORCINE) IN NACL 2-0.9 UNIT/ML-% IJ SOLN
INTRAMUSCULAR | Status: AC
  Filled 2012-06-08: qty 1000

## 2012-06-08 MED ORDER — HYDROXYZINE HCL 25 MG PO TABS
25.0000 mg | ORAL_TABLET | Freq: Three times a day (TID) | ORAL | Status: DC | PRN
  Administered 2012-06-08 – 2012-06-20 (×7): 25 mg via ORAL
  Filled 2012-06-08 (×5): qty 1

## 2012-06-08 MED ORDER — DARBEPOETIN ALFA-POLYSORBATE 100 MCG/0.5ML IJ SOLN
INTRAMUSCULAR | Status: AC
  Administered 2012-06-08: 100 ug via INTRAVENOUS
  Filled 2012-06-08: qty 0.5

## 2012-06-08 MED ORDER — DOCUSATE SODIUM 283 MG RE ENEM
1.0000 | ENEMA | RECTAL | Status: DC | PRN
  Filled 2012-06-08: qty 1

## 2012-06-08 MED ORDER — METOPROLOL TARTRATE 1 MG/ML IV SOLN: 5.0000 mg | Freq: Once | INTRAVENOUS | Status: DC

## 2012-06-08 MED ORDER — NITROGLYCERIN 0.2 MG/ML ON CALL CATH LAB
INTRAVENOUS | Status: AC
  Filled 2012-06-08: qty 1

## 2012-06-08 MED ORDER — SODIUM CHLORIDE 0.9 % IV SOLN: 100.0000 mL | INTRAVENOUS | Status: DC | PRN

## 2012-06-08 MED ORDER — METHYLPREDNISOLONE SODIUM SUCC 125 MG IJ SOLR
80.0000 mg | INTRAMUSCULAR | Status: AC
  Administered 2012-06-08: 80 mg via INTRAVENOUS
  Filled 2012-06-08: qty 2
  Filled 2012-06-08: qty 1.28

## 2012-06-08 MED ORDER — NITROGLYCERIN 0.4 MG SL SUBL
0.4000 mg | SUBLINGUAL_TABLET | SUBLINGUAL | Status: DC | PRN
  Administered 2012-06-12 – 2012-06-13 (×5): 0.4 mg via SUBLINGUAL
  Filled 2012-06-08: qty 25

## 2012-06-08 MED ORDER — TRAMADOL HCL 50 MG PO TABS
25.0000 mg | ORAL_TABLET | Freq: Two times a day (BID) | ORAL | Status: DC | PRN
  Administered 2012-06-10: 25 mg via ORAL
  Filled 2012-06-08: qty 1

## 2012-06-08 MED ORDER — LIDOCAINE HCL (PF) 1 % IJ SOLN
INTRAMUSCULAR | Status: AC
  Filled 2012-06-08: qty 30

## 2012-06-08 MED ORDER — LABETALOL HCL 200 MG PO TABS
200.0000 mg | ORAL_TABLET | Freq: Two times a day (BID) | ORAL | Status: DC
  Administered 2012-06-08 – 2012-06-10 (×5): 200 mg via ORAL
  Filled 2012-06-08 (×12): qty 1

## 2012-06-08 MED ORDER — DIPHENHYDRAMINE HCL 50 MG/ML IJ SOLN
12.5000 mg | Freq: Once | INTRAMUSCULAR | Status: AC
  Administered 2012-06-08: 21:00:00 via INTRAVENOUS

## 2012-06-08 MED ORDER — CALCIUM CARBONATE 1250 MG/5ML PO SUSP
500.0000 mg | Freq: Four times a day (QID) | ORAL | Status: DC | PRN
  Administered 2012-06-19: 500 mg via ORAL
  Filled 2012-06-08 (×2): qty 5

## 2012-06-08 MED ORDER — CLONIDINE HCL 0.2 MG PO TABS
0.2000 mg | ORAL_TABLET | Freq: Two times a day (BID) | ORAL | Status: DC
  Filled 2012-06-08 (×4): qty 1

## 2012-06-08 MED ORDER — ALTEPLASE 2 MG IJ SOLR
2.0000 mg | Freq: Once | INTRAMUSCULAR | Status: DC | PRN
  Filled 2012-06-08: qty 2

## 2012-06-08 MED ORDER — FOLIC ACID 1 MG PO TABS
1.0000 mg | ORAL_TABLET | Freq: Every day | ORAL | Status: DC
  Administered 2012-06-08 – 2012-06-20 (×12): 1 mg via ORAL
  Filled 2012-06-08 (×13): qty 1

## 2012-06-08 MED ORDER — ASPIRIN 81 MG PO CHEW
324.0000 mg | CHEWABLE_TABLET | ORAL | Status: AC
  Administered 2012-06-08: 324 mg via ORAL
  Filled 2012-06-08: qty 4

## 2012-06-08 MED ORDER — DOXERCALCIFEROL 4 MCG/2ML IV SOLN
3.5000 ug | INTRAVENOUS | Status: DC
  Administered 2012-06-08: 3.5 ug via INTRAVENOUS

## 2012-06-08 MED ORDER — ACETAMINOPHEN 650 MG RE SUPP: 650.0000 mg | Freq: Four times a day (QID) | RECTAL | Status: DC | PRN

## 2012-06-08 MED ORDER — HEPARIN SODIUM (PORCINE) 1000 UNIT/ML DIALYSIS: 20.0000 [IU]/kg | INTRAMUSCULAR | Status: DC | PRN

## 2012-06-08 MED ORDER — ASPIRIN EC 81 MG PO TBEC
81.0000 mg | DELAYED_RELEASE_TABLET | Freq: Every day | ORAL | Status: DC
  Administered 2012-06-09 – 2012-06-10 (×2): 81 mg via ORAL
  Filled 2012-06-08 (×3): qty 1

## 2012-06-08 MED ORDER — ATORVASTATIN CALCIUM 10 MG PO TABS
10.0000 mg | ORAL_TABLET | Freq: Every day | ORAL | Status: DC
  Administered 2012-06-08 – 2012-06-19 (×10): 10 mg via ORAL
  Filled 2012-06-08 (×13): qty 1

## 2012-06-08 MED ORDER — HEPARIN (PORCINE) IN NACL 100-0.45 UNIT/ML-% IJ SOLN
1350.0000 [IU]/h | INTRAMUSCULAR | Status: DC
  Administered 2012-06-09: 900 [IU]/h via INTRAVENOUS
  Administered 2012-06-10: 1000 [IU]/h via INTRAVENOUS
  Administered 2012-06-11 – 2012-06-12 (×2): 1200 [IU]/h via INTRAVENOUS
  Filled 2012-06-08 (×10): qty 250

## 2012-06-08 MED ORDER — HEPARIN SODIUM (PORCINE) 1000 UNIT/ML DIALYSIS: 1000.0000 [IU] | INTRAMUSCULAR | Status: DC | PRN

## 2012-06-08 MED ORDER — AMLODIPINE BESYLATE 10 MG PO TABS
10.0000 mg | ORAL_TABLET | Freq: Every day | ORAL | Status: DC
  Administered 2012-06-08: 10 mg via ORAL
  Filled 2012-06-08 (×2): qty 1

## 2012-06-08 MED ORDER — FAMOTIDINE IN NACL 20-0.9 MG/50ML-% IV SOLN
20.0000 mg | INTRAVENOUS | Status: AC
  Administered 2012-06-08: 20 mg via INTRAVENOUS
  Filled 2012-06-08: qty 50

## 2012-06-08 MED ORDER — NEPRO/CARBSTEADY PO LIQD
237.0000 mL | Freq: Three times a day (TID) | ORAL | Status: DC | PRN
  Filled 2012-06-08: qty 237

## 2012-06-08 MED ORDER — ALPRAZOLAM 0.25 MG PO TABS
0.2500 mg | ORAL_TABLET | Freq: Every evening | ORAL | Status: DC | PRN
  Administered 2012-06-08 – 2012-06-13 (×2): 0.25 mg via ORAL
  Filled 2012-06-08 (×2): qty 1

## 2012-06-08 MED ORDER — MIDAZOLAM HCL 2 MG/2ML IJ SOLN
INTRAMUSCULAR | Status: AC
  Filled 2012-06-08: qty 2

## 2012-06-08 NOTE — Progress Notes (Signed)
Pt is having severe itching all over her body despite getting 25mg  hydralazine. Dr. Lorrene Reid called and informed of this(? Allergic reaction). Pt has no hives and denies feeling tight in her throat or problems breathing. Benedryl ordered. Dr. Lorrene Reid states it may be related to her having a cardiac cath. Pt has a Hx of an allergy to contrast dye

## 2012-06-08 NOTE — Interval H&P Note (Signed)
History and Physical Interval Note:  06/08/2012 8:48 AM  Shawna [name]  has presented today for cardiac cath with the diagnosis of cad, unstable angina.  The various methods of treatment have been discussed with the patient and family. After consideration of risks, benefits and other options for treatment, the patient has consented to  Procedure(s) (LRB) with comments: LEFT HEART CATHETERIZATION WITH CORONARY ANGIOGRAM (N/A) as a surgical intervention .  The patient's history has been reviewed, patient examined, no change in status, stable for surgery.  I have reviewed the patient's chart and labs.  Questions were answered to the patient's satisfaction.     [name],CHRISTOPHER

## 2012-06-08 NOTE — Progress Notes (Signed)
Pt arrived to room 2919 with [facility] at approximately 0630. Pt receiving the following infusions through RFA IV site: NTG at 10 mcg/min and heparin at 1500 units/hr. Pt chest pain free upon admission.

## 2012-06-08 NOTE — H&P (Signed)
Patient ID: Shawna [name] MRN: [id number], DOB/AGE: August 07, 1940   Admit date: 06/08/2012  Primary Physician: [name], MD Primary Cardiologist: Garnet Koyanagi MD  Pt. Profile:  71 y/o female w/ h/o CAD who presents 2/2 recurrence of chest pain.  Problem List  Past Medical History  Diagnosis Date  . Essential hypertension, benign   . Chronic bronchitis   . GERD (gastroesophageal reflux disease)   . PUD (peptic ulcer disease)   . History of lower GI bleeding   . Arthritis   . History of gout   . Ovarian cancer 1992  . Colon cancer   . Coronary atherosclerosis of native coronary artery     a. 12/2011 NSTEMI/Cath/PCI LCX (2.25x14 Resolute DES) & D1 (2.25x22 Resolute DES);  b. 01/2012 Cath/PCI: LM 30, LAD 30p, 40-32m, D1 stent ok, 99 in sm branch of diag, LCX patent stent, OM1 20, RCA 95 ost (4.0x12 Promus DES), EF 55%;  c. 04/2012 Lexi Cardiolite  EF 48%, small area of scar @ base/mid inflat wall with mild peri-infarct ischemia.  . High cholesterol 12/2011  . Chronic diastolic CHF (congestive heart failure)     a. 02/2012 Echo EF 60-65%, nl wall motion, Gr 1 DD, mod MR  . Pneumonia ~ 2009  . Iron deficiency anemia   . TIA (transient ischemic attack)   . Anxiety   . History of blood transfusion 07/2011; 12/2011; 01/2012 X 2; 04/2012  . ESRD (end stage renal disease) on dialysis 12/15/11    a. HD @ [facility]; Mon, Wed, Fri  . Carotid artery disease     a. [id number] LICA, [id number]   . Mitral regurgitation     a. Moderate by echo, 02/2012    Past Surgical History  Procedure Date  . Abdominal hysterectomy 1992  . Appendectomy 06/1990  . Tubal ligation 1980's  . Av fistula placement 07/2009    left upper arm  . Thrombectomy / arteriovenous graft revision 2011    left upper arm  . Colon resection 1992  . Esophagogastroduodenoscopy 01/20/2012    Procedure: ESOPHAGOGASTRODUODENOSCOPY (EGD);  Surgeon: Ladene Artist, MD,FACG;  Location: [facility] ENDOSCOPY;  Service: Endoscopy;  Laterality: N/A;   . Dilation and curettage of uterus   . Colon surgery   . Coronary angioplasty with stent placement 12/15/11    "2"  . Coronary angioplasty with stent placement y/2013    "1; makes total of 3" (05/02/2012)    Allergies  Allergies  Allergen Reactions  . Aspirin Other (See Comments)    Mess up her stomach; "makes my bowels have blood in them"  . Contrast Media (Iodinated Diagnostic Agents) Itching  . Iron Itching    "they gave me iron in dialysis; had to give me Benadryl cause I had to have the iron" (05/02/2012)  . Macrodantin (Nitrofurantoin Macrocrystal) Other (See Comments)    "broke me out in big old knots all over my body; had to go to ER"  . Penicillins Other (See Comments)    "makes me real weak when I take it; like I'll pass out"  . Plavix (Clopidogrel Bisulfate) Rash  . Bactrim (Sulfamethoxazole W-Trimethoprim) Rash  . Sulfa Antibiotics Rash  . Venofer (Ferric Oxide) Itching    Patient reports using Benadryl prior to doses as [place]   HPI  71 y/o female with the above complex problem list.  She is s/p stenting of the Diag & LCX in June 2013 with recurrent c/p in July and stenting of the ostial  RCA.  She has had multiple admissions 2/2 recurrent chest pain, the last of which was a month ago.  She had a negative cardiolite @ [facility] just prior to that.    Last night, while sitting and watching TV she developed sudden onset of 10/10 substernal chest pain with radiation down her left arm and to the midscapular area.  She presented to [facility] where ECG showed inflat st depression with t changes and poor R progression. ST changes were new. She was treated with mso4, ntg, heparin.  Troponin was mildly elevated @ 0.34 and subsequently rose to 0.36.  Pain improved some with interventions but has been intermittent.  She was transferred to [facility] for further eval.  She continues to c/o chest, back, and left arm discomfort.  Ss currently improving with titration of NTG.   Home  Medications  Prior to Admission medications   Medication Sig Start Date End Date Taking? Authorizing Provider  allopurinol (ZYLOPRIM) 300 MG tablet Take 300 mg by mouth daily as needed.    Historical Provider, MD  ALPRAZolam Duanne Moron) 0.25 MG tablet Take 0.25-0.5 mg by mouth at bedtime as needed. For anxiety.    Historical Provider, MD  amLODipine (NORVASC) 10 MG tablet Take 10 mg by mouth daily.    Historical Provider, MD  aspirin EC 81 MG tablet Take 81 mg by mouth daily.    Historical Provider, MD  atorvastatin (LIPITOR) 10 MG tablet Take 10 mg by mouth daily.    Historical Provider, MD  cloNIDine (CATAPRES) 0.2 MG tablet Take 1 tablet (0.2 mg total) by mouth 2 (two) times daily. 05/03/12   Annita Brod, MD  fluticasone (FLONASE) 50 MCG/ACT nasal spray Place 2 sprays into the nose daily.    Historical Provider, MD  folic acid (FOLVITE) 1 MG tablet Take 1 mg by mouth daily.    Historical Provider, MD  folic acid-vitamin b complex-vitamin c-selenium-zinc (DIALYVITE) 3 MG TABS Take 1 tablet by mouth daily.    Historical Provider, MD  hydrochlorothiazide (MICROZIDE) 12.5 MG capsule Take 1 capsule (12.5 mg total) by mouth daily. 05/03/12   Annita Brod, MD  isosorbide mononitrate (IMDUR) 60 MG 24 hr tablet Take 1 tablet (60 mg total) by mouth daily. 05/29/12   Aurora Mask, PA  labetalol (NORMODYNE) 200 MG tablet Take 200 mg by mouth 2 (two) times daily. START ON 02/03/12 AND STOP METOPROLOL TARTRATE 02/02/12   Satira Sark, MD  lanthanum (FOSRENOL) 500 MG chewable tablet Chew 1,000 mg by mouth 3 (three) times daily with meals.    Historical Provider, MD  lidocaine-prilocaine (EMLA) cream Apply 1 application topically as needed.    Historical Provider, MD  lisinopril (PRINIVIL,ZESTRIL) 10 MG tablet Take 1 tablet by mouth Daily. 02/13/12   Historical Provider, MD  loratadine (CLARITIN) 10 MG tablet Take 10 mg by mouth daily as needed. For allergies.    Historical Provider, MD    nitroGLYCERIN (NITROSTAT) 0.4 MG SL tablet Place 0.4 mg under the tongue every 5 (five) minutes as needed. For chest pain.    Historical Provider, MD  pantoprazole (PROTONIX) 20 MG tablet Take 2 tablets (40 mg total) by mouth 2 (two) times daily. 05/07/12   Minus Breeding, MD  Ticagrelor (BRILINTA) 90 MG TABS tablet Take 90 mg by mouth 2 (two) times daily.    Historical Provider, MD  traMADol (ULTRAM) 50 MG tablet Take 0.5 tablets by mouth Three times daily as needed. 05/24/12   Historical Provider, MD  Family History  Family History  Problem Relation Age of Onset  . Other      noncontributory for early CAD   Social History  History   Social History  . Marital Status: Married    Spouse Name: N/A    Number of Children: N/A  . Years of Education: N/A   Occupational History  . Not on file.   Social History Main Topics  . Smoking status: Never Smoker   . Smokeless tobacco: Never Used  . Alcohol Use: No  . Drug Use: No  . Sexually Active: Not on file   Other Topics Concern  . Not on file   Social History Narrative   Lives in [place], [place] with husband.  Dialysis pt - [facility].    Review of Systems General:  No chills, fever, night sweats or weight changes.  Cardiovascular:  +++ chest pain & dyspnea.  No edema, orthopnea, palpitations, paroxysmal nocturnal dyspnea. Dermatological: No rash, lesions/masses Respiratory: No cough, +++dyspnea Urologic: No hematuria, dysuria Abdominal:   No nausea, vomiting, diarrhea, bright red blood per rectum, melena, or hematemesis Neurologic:  No visual changes, wkns, changes in mental status. All other systems reviewed and are otherwise negative except as noted above.  Physical Exam  Blood pressure 159/73, pulse 81, temperature 98.1 F (36.7 C), temperature source Oral, resp. rate 17, height 5\' 1"  (1.549 m), SpO2 98.00%.  General: Pleasant, NAD Psych: Normal affect. Neuro: Alert and oriented X 3. Moves all extremities spontaneously. HEENT:  Normal  Neck: Supple without bruits or JVD. Lungs:  Resp regular and unlabored, diminished breath sounds @ bilat bases with crackles. Heart: RRR no s3, s4, or murmurs. Abdomen: Soft, non-tender, non-distended, BS + x 4.  Extremities: No clubbing, cyanosis or edema. DP/PT/Radials 2+ and equal bilaterally.  Labs  Wbc: 7.0 HGB 10.4 HCT 32.6 PLT 283  Trop I 0.34->0.36  Na 142, K 4.5, Cl 102, CO2 25.0, BUN 43, Creat 8.10, Alb 3.7,Alk Phos 68, AST 26, ALT 23.  D Dimer 0.63   Radiology/Studies  CXR 11/28: prob airspace consolidation in the medial aspect of the RLL concerning for pna or sequelae of aspiration.  Mildcardiomegaly.  Athersclerosis.  ECG  Sinus, 100, lvh, inflat st dep, poor r prog. F/u ECG @ [facility]->rsr, 76, lvh,poor r prog, improved st segments.  ASSESSMENT AND PLAN  1. [place]/CAD - ? NSTEMI:  Pt presents with recurrent chest pain, "worse than" prior angina.  Very mild troponin elevation in setting of elevated creat.  She continues to complain of discomfort radiating to her back and left arm. We will pre-treat her with IV solumedrol, pepcid, and benadryl and plan on diagnostic cath this AM. Cont home meds.  Cont heparin/ntg ->cont to titrate for chest pain.  Cont to cycle CE. Will need dialysis post-cath. I have discussed with nephrology.  Of note, will need to consider VQ scan if cath unrevealing as her D Dimer is also mildly elevated (in setting of creat of 8).  2. ESRD: MWF dialysis in [place]. For dialysis today.  Appreciate nephrology assistance.  3.  HTN:  Poorly controlled. Cont home meds and titrate.  Currently titrating IV ntg.  4.  HL:  Cont statin.  5.  H/O PUD: add PPI -? Role in discomfort.  6.  Abnl CXR:  cxr @ [facility] with ? Of pna. Pt is afebrile and wbc nl.  Chest pain is not pleuritic in nature.  Exam reveals bibasilar crackles (due dialysis today), will obtain PA/Lat cxr post-cath.  7.  Acute on Chronic Diastolic CHF: in setting of elevated pressures, she  does have crackles on exam.  For dialysis today.  Signed, Murray Hodgkins, NP 06/08/2012, 7:29 AM'  Patient seen and examined. I agree with the assessment and plan as detailed above. See also my additional thoughts below.   The patient presents with a non-STEMI and ongoing fluctuating chest pain. Her coronary disease has been very aggressive in the past year. She has had multiple procedures. She says that the discomfort that she had last night was the worst that she has had yet. EKG revealed ST depression. This has improved somewhat. With IV nitroglycerin her pain is markedly decreased. She does have pain into her arm. She also has some pain up into her shoulder blades. All of her pain still is most likely from her coronary ischemia. It seems unlikely that she has a dissection at this time. However hypertension is an issue. I doubt that the mild elevation of d-dimer is playing any role here. The patient does have a contrast allergy. She is being treated for this aggressively already this morning. Plans are being finalize for catheterization as the day goes on today.   Dola Argyle, MD, [facility] 06/08/2012 8:30 AM

## 2012-06-08 NOTE — Consult Note (Signed)
Reason for Consult: To provide dialysis and to deal with any dialysis related needs Referring Physician: Dr. Caryl Comes   HPI Shawna [name] is an 71 y.o. female with past medical history significant for hypertension, arthritis, gout, hypercholesterolemia as well as a long-standing history of coronary artery disease. Patient also has a history of end-stage renal disease and dialyzes Mondays Wednesdays and Fridays at the [facility] dialysis unit. Patient last had stents placed to her circumflex and diagonal this summer. She presented with unstable angina and underwent a cardiac catheterization today. She was found to have restenosed 2 out of her 3 stents and will now undergo a consultation by CT surgery for a possible CABG. Today is her dialysis day and she is due for treatment. Currently patient is alert pleasant and in no acute distress. She understands the results of her catheterization.   Dialyzes at [facility] on Mondays Wednesdays and Fridays  Primary Nephrologist Dr. Lowanda Foster. EDW 61.5. HD Bath 2K, 2.5 calcium,, Heparin yes. Access AV fistula on the left.  Past Medical History  Diagnosis Date  . Essential hypertension, benign   . Chronic bronchitis   . GERD (gastroesophageal reflux disease)   . PUD (peptic ulcer disease)   . History of lower GI bleeding   . Arthritis   . History of gout   . Ovarian cancer 1992  . Colon cancer   . Coronary atherosclerosis of native coronary artery     a. 12/2011 NSTEMI/Cath/PCI LCX (2.25x14 Resolute DES) & D1 (2.25x22 Resolute DES);  b. 01/2012 Cath/PCI: LM 30, LAD 30p, 40-26m, D1 stent ok, 99 in sm branch of diag, LCX patent stent, OM1 20, RCA 95 ost (4.0x12 Promus DES), EF 55%;  c. 04/2012 Lexi Cardiolite  EF 48%, small area of scar @ base/mid inflat wall with mild peri-infarct ischemia.  . High cholesterol 12/2011  . Chronic diastolic CHF (congestive heart failure)     a. 02/2012 Echo EF 60-65%, nl wall motion, Gr 1 DD, mod MR  . Pneumonia ~ 2009  . Iron  deficiency anemia   . TIA (transient ischemic attack)   . Anxiety   . History of blood transfusion 07/2011; 12/2011; 01/2012 X 2; 04/2012  . ESRD (end stage renal disease) on dialysis 12/15/11    a. HD @ [facility]; Mon, Wed, Fri  . Carotid artery disease     a. [id number] LICA, [id number]   . Mitral regurgitation     a. Moderate by echo, 02/2012    Past Surgical History  Procedure Date  . Abdominal hysterectomy 1992  . Appendectomy 06/1990  . Tubal ligation 1980's  . Av fistula placement 07/2009    left upper arm  . Thrombectomy / arteriovenous graft revision 2011    left upper arm  . Colon resection 1992  . Esophagogastroduodenoscopy 01/20/2012    Procedure: ESOPHAGOGASTRODUODENOSCOPY (EGD);  Surgeon: Ladene Artist, MD,FACG;  Location: [facility] ENDOSCOPY;  Service: Endoscopy;  Laterality: N/A;  . Dilation and curettage of uterus   . Colon surgery   . Coronary angioplasty with stent placement 12/15/11    "2"  . Coronary angioplasty with stent placement y/2013    "1; makes total of 3" (05/02/2012)    Family History  Problem Relation Age of Onset  . Other      noncontributory for early CAD    Social History:  reports that she has never smoked. She has never used smokeless tobacco. She reports that she does not drink alcohol or use illicit drugs.  Allergies:  Allergies  Allergen Reactions  . Aspirin Other (See Comments)    Mess up her stomach; "makes my bowels have blood in them". Takes 81 mg EC Aspirin   . Contrast Media (Iodinated Diagnostic Agents) Itching  . Iron Itching    "they gave me iron in dialysis; had to give me Benadryl cause I had to have the iron" (05/02/2012)  . Macrodantin (Nitrofurantoin Macrocrystal) Other (See Comments)    "broke me out in big old knots all over my body; had to go to ER"  . Penicillins Other (See Comments)    "makes me real weak when I take it; like I'll pass out"  . Plavix (Clopidogrel Bisulfate) Rash  . Bactrim (Sulfamethoxazole W-Trimethoprim)  Rash  . Sulfa Antibiotics Rash  . Venofer (Ferric Oxide) Itching    Patient reports using Benadryl prior to doses as [place]    Medications: I have reviewed the patient's current medications. Hectorol 3.5 mcg with HD Epogen 9900 units with HD, Venofer 50 mg every week  Results for orders placed during the hospital encounter of 06/08/12 (from the past 48 hour(s))  MRSA PCR SCREENING     Status: Normal   Collection Time   06/08/12  6:51 AM      Component Value Range Comment   MRSA by PCR NEGATIVE  NEGATIVE     No results found.  ROS: Patient reports upper respiratory congestion. This is caused excessive coughing. The excessive coughing has caused some musculoskeletal abdominal pain. Patient also reports frequent chest pain and the nitroglycerin is the only thing that stops it. She denies edema. She denies overt shortness of breath. She denies nausea, vomiting, diarrhea, constipation. She denies fevers, chills, night sweats. The remainder of the review systems is negative.     Physical exam Blood pressure 160/71, pulse 85, temperature 98.1 F (36.7 C), temperature source Oral, resp. rate 17, height 5\' 1"  (1.549 m), weight 63.1 kg (139 lb 1.8 oz), SpO2 98.00%. General: Patient is alert, pleasant and in no acute distress. HEENT: Pupils are equal round reactive to light, extra motions are intact, visualization of eyeground reveals normal vasculature bilaterally. Mucous membranes are moist without lesion. Neck: There is no jugular venous distention, carotid bruits or lymphadenopathy. Lungs: Anteriorly clear to auscultation bilaterally. There is no wheezing Cardiovascular: Regular rate and rhythm without murmur, gallop, or rub. Abdomen: Obese, soft, nontender, nondistended. Bowel sounds are positive. Extremities: There is very little dependent edema. She has an AV fistula in her left upper arm which has a good thrill and bruit.   Assessment/Plan: 71 year old black female with  end-stage renal disease. She presents with unstable angina and is status post cardiac catheterization which reveals three-vessel disease and she likely will need CABG. 1 Coronary Artery Disease: Appears to have restenosis of stents that were placed 6 months ago. The plan is to have her evaluated for CABG. She is continuing to have chest pain when she is not on nitroglycerin. Therefore, she will likely stay in-house until the time of her cardiac surgery. Plan as per cardiology. 2 ESRD: Normal dialysis schedule is Monday Wednesday Friday at the [facility] unit via an AV fistula. Plan will be for regular dialysis treatment later today. Since she is on heparin drip we'll try for no heparin with treatment. 3 Hypertension: Patient is hypertensive and outpatient records indicate that she is hypertensive mostly at the outpatient unit as well. Volume removal as tolerated. She is currently on Norvasc, clonidine as well as labetalol  and Zestril at low doses. I wonder if better volume control would allow [place] to wean blood pressure medications some. This will continue to be followed. 4. Anemia of ESRD: Patient receives a most 10,000 units of Epogen per treatment. There are no labs as of yet here. ESA will be continued in the form of Aranesp. She receives iron weekly at the dialysis unit. Iron stores will be checked here. 5. Metabolic Bone Disease: Patient is on Fosrenol as an outpatient has been ordered here. She's also on Hectorol which will be continued.  Thank you for this consultation. We will continue to follow with you.   Alonie Gazzola A 06/08/2012, 11:31 AM

## 2012-06-08 NOTE — Progress Notes (Signed)
Itching has improved. Pt is complaining of having a headache.

## 2012-06-08 NOTE — Progress Notes (Signed)
Utilization Review Completed.  Garry Nicolini, [place] T  06/08/2012

## 2012-06-08 NOTE — Progress Notes (Signed)
Chaplain visited patient and family during his daily routine rounds. Patient was awake, alert and responsive. Chaplain provided ministry of presence and empathic listening. Chaplain also shared words of hope, comfort, encouragement and prayed with patient and family member. Chaplain will continue to visit and provide spiritual care to both patient and family as needed. Patient and family expressed their appreciation for Chaplain's visit and support.

## 2012-06-08 NOTE — CV Procedure (Signed)
   Cardiac Catheterization Operative Report  Shawna [name] [id number] 11/29/20139:45 AM [name], MD  Procedure Performed:  1. Left Heart Catheterization 2. Selective Coronary Angiography 3. Left ventricular angiogram  Operator: Lauree Chandler, MD  Indication:  71 yo female with history of HTN, HLD, CAD, ESRD with recent drug eluting stents placed in the proximal circumflex and diagonal in June 2013, ostial RCA in July 2013 admitted with unstable angina.                                  Procedure Details: The risks, benefits, complications, treatment options, and expected outcomes were discussed with the patient. The patient and/or family concurred with the proposed plan, giving informed consent. The patient was brought to the cath lab after IV hydration was begun and oral premedication was given. The patient was further sedated with Versed and Fentanyl. The right groin was prepped and draped in the usual manner. Using the modified Seldinger access technique, a 5 French sheath was placed in the right femoral artery. Standard diagnostic catheters were used to perform selective coronary angiography. A pigtail catheter was used to perform a left ventricular angiogram.  There were no immediate complications. The patient was taken to the recovery area in stable condition.   Hemodynamic Findings: Central aortic pressure: 169/77 Left ventricular pressure: 166/11/21  Angiographic Findings:  Left main: 40% distal stenosis.  Left Anterior Descending Artery: Large caliber vessel that courses to the apex. The proximal vessel has 30-40% stenosis. The mid vessel has a 40% stenosis. The mid to distal vessel has a hazy 60-70% stenosis. The diagonal is a moderate sized vessel   Intermediate artery: Moderate caliber vessel with ostial 30% stenosis.   Circumflex Artery: Moderate caliber vessel with proximal stent. There is 99% restenosis within the proximal edge of the stent. The first  marginal branch is small to moderate sized with 99% ostial stenosis. This vessel arises from the distal portion of the stented segment.   Right Coronary Artery: Very large caliber, dominant vessel with ostial stent with 99% restenosis in the stented segment beginning at the ostium. The mid and distal vessel has mild plaque disease.   Left Ventricular Angiogram: LVEF=40%, global hypokinesis.   Impression: 1. Triple vessel CAD with restenosis in the stented segments of the RCA and Circumflex with progression of the disease in the LAD 2. Moderate LV systolic dysfunction 3. Unstable angina  Recommendations: She has failed percutaneous therapy with restenosis in 2 of her 3 stents, all in critical locations. Will consult CT surgery for CABG. Will hold Brilinta. Will continue heparin drip 8 hours post sheath pull. NTG drip. Monitor in step-down.        Complications:  None. The patient tolerated the procedure well.

## 2012-06-08 NOTE — Progress Notes (Signed)
Pt continues to have severe itching after benadryl 12.5mg  given. Dr. Everlean Cherry called and informed of continued itching and of a hx of an allergic reaction to contrast media. Additional 12.5mg  benadryl ordered and given IV.

## 2012-06-09 ENCOUNTER — Inpatient Hospital Stay (HOSPITAL_COMMUNITY): Payer: Medicare Other

## 2012-06-09 DIAGNOSIS — J189 Pneumonia, unspecified organism: Secondary | ICD-10-CM | POA: Diagnosis present

## 2012-06-09 LAB — BASIC METABOLIC PANEL
Calcium: 9.6 mg/dL (ref 8.4–10.5)
GFR calc Af Amer: 10 mL/min — ABNORMAL LOW (ref >90)
GFR calc non Af Amer: 8 mL/min — ABNORMAL LOW (ref >90)
Glucose, Bld: 127 mg/dL — ABNORMAL HIGH (ref 70–99)
Potassium: 3.9 mEq/L (ref 3.5–5.1)
Sodium: 140 mEq/L (ref 135–145)

## 2012-06-09 LAB — CBC
Hemoglobin: 9.3 g/dL — ABNORMAL LOW (ref 12.0–15.0)
MCH: 30.4 pg (ref 26.0–34.0)
Platelets: 271 10*3/uL (ref 150–400)
RBC: 3.06 MIL/uL — ABNORMAL LOW (ref 3.87–5.11)

## 2012-06-09 LAB — HEPARIN LEVEL (UNFRACTIONATED): Heparin Unfractionated: 0.38 IU/mL (ref 0.30–0.70)

## 2012-06-09 IMAGING — CR DG CHEST 2V
2 series · 2 of 2 positions shown · non-contrast
Comparison: One-view chest [DATE].

CLINICAL DATA: Abnormal chest x-ray follow-up. Rule out pneumonia.

CHEST - 2 VIEW

[w chest lat]
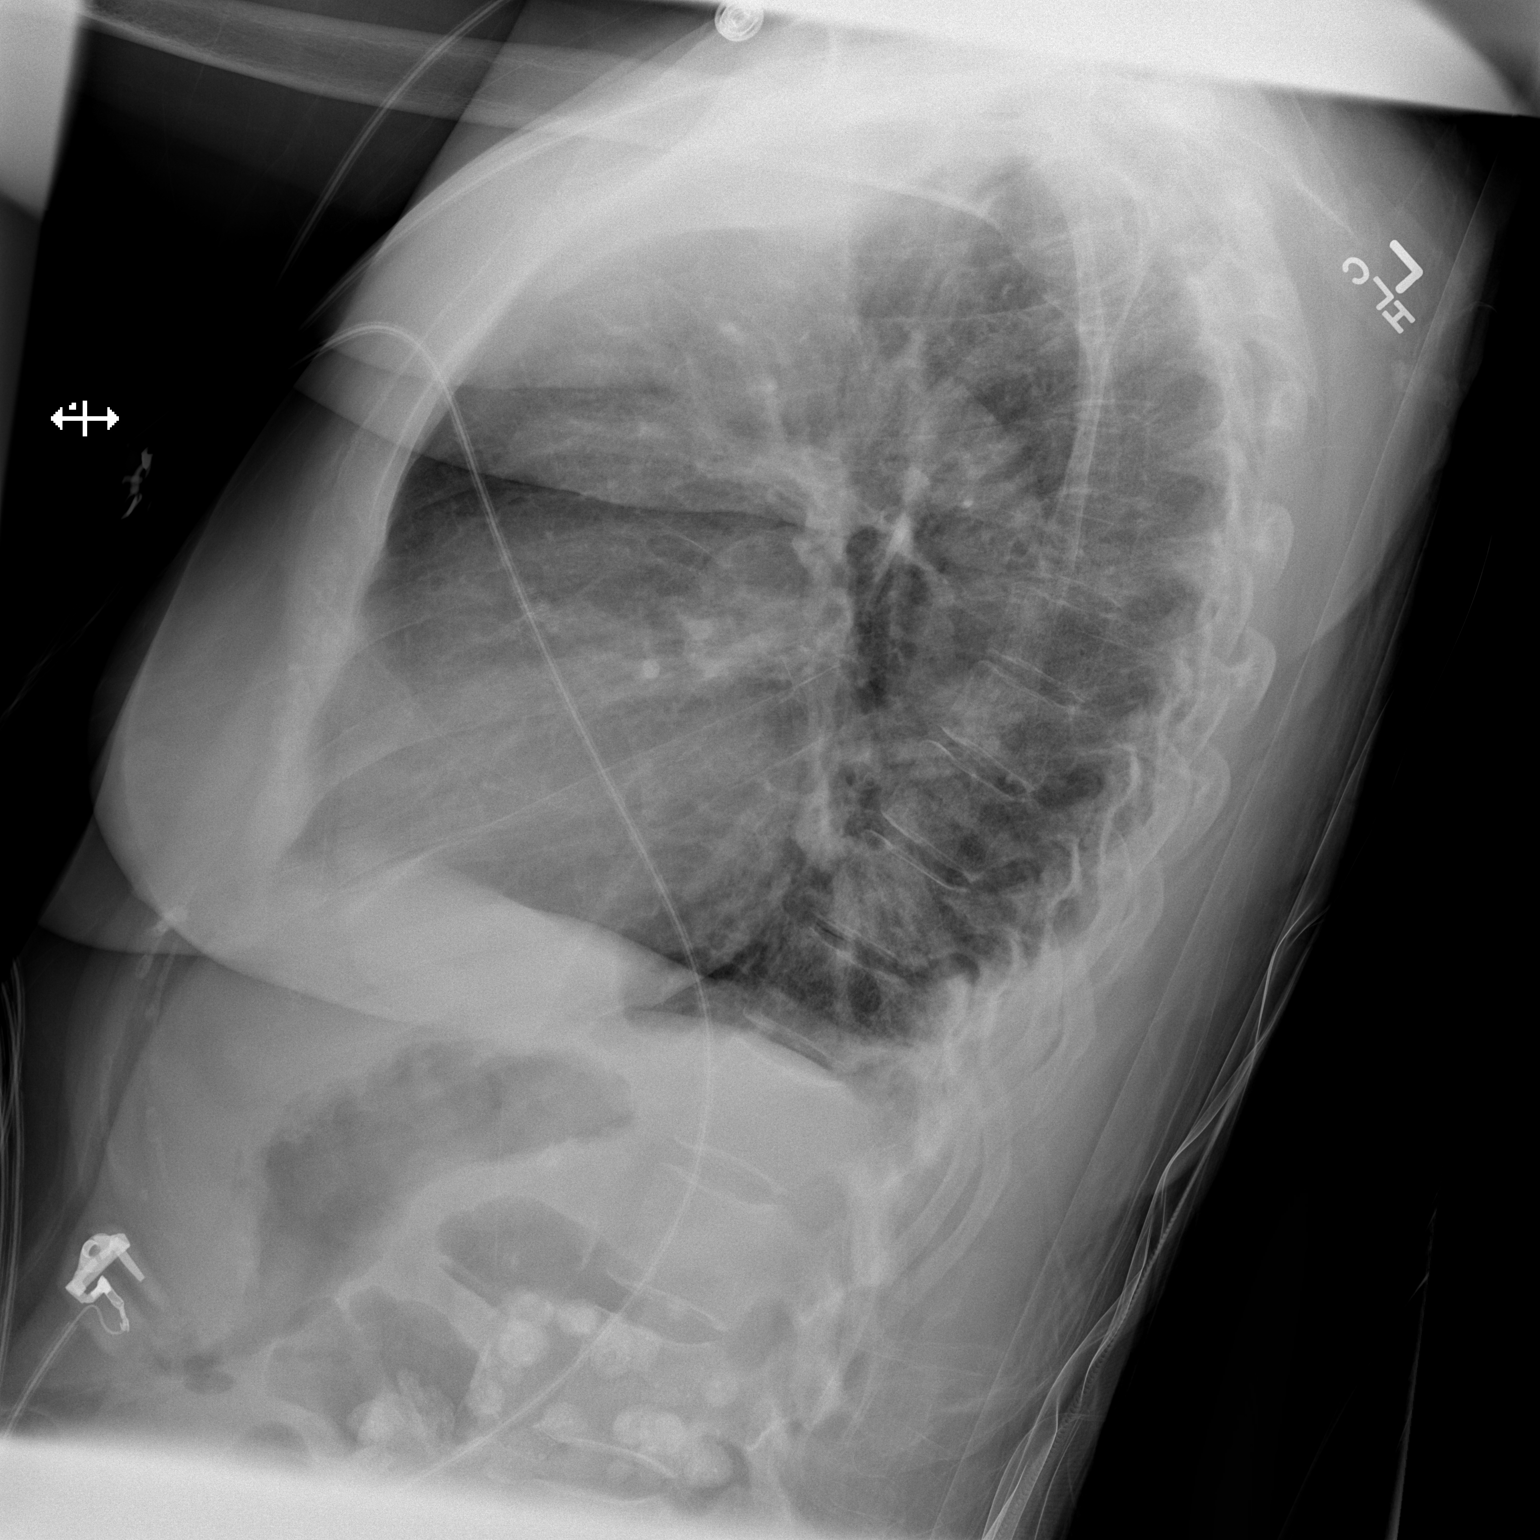

[x chest ap]
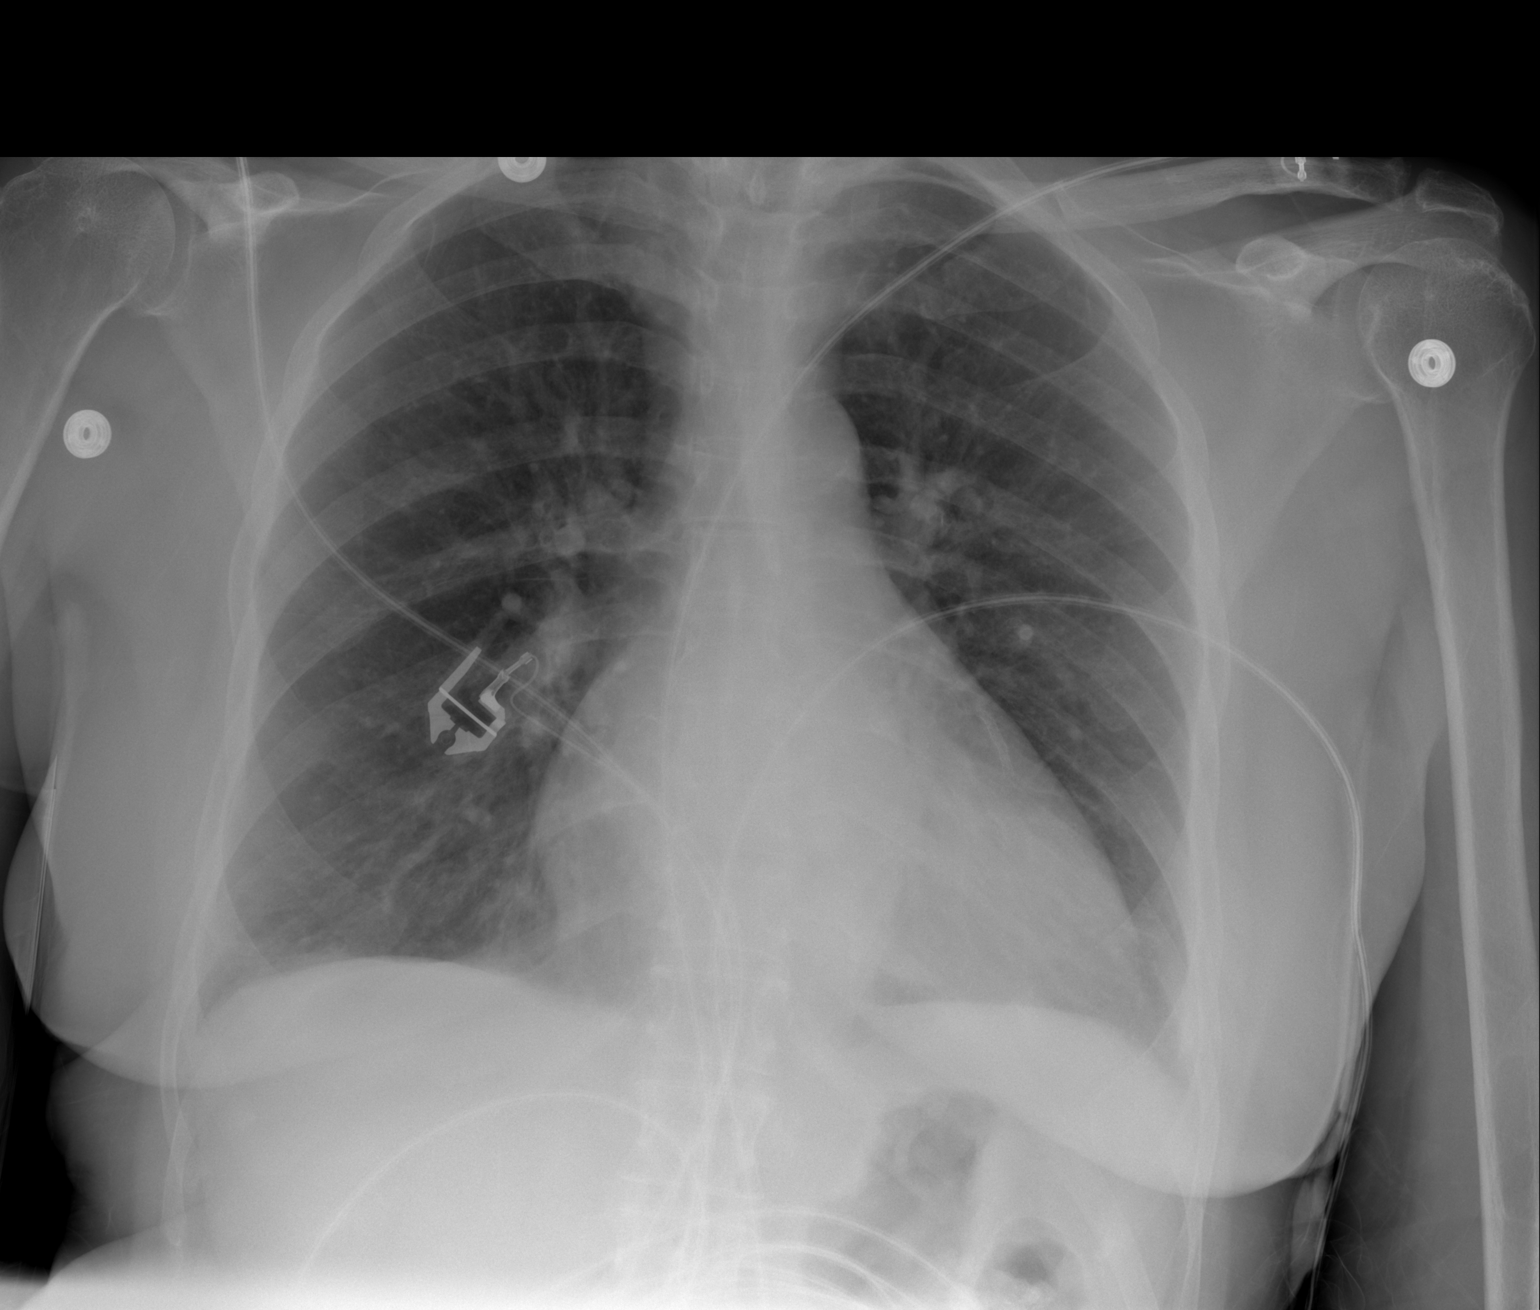

[2 of 2 positions shown; findings below may reference images not displayed]

FINDINGS: The heart is mildly enlarged.  There is no edema or
effusion to suggest failure. Right lower lobe pneumonia is
suspected with new right lower lobe airspace consolidation since
the exam of [DATE].  The visualized soft tissues and bony
thorax are unremarkable.
IMPRESSION: 1.  Right lower lobe pneumonia.
2.  Mild cardiomegaly without failure.

## 2012-06-09 MED ORDER — LEVOFLOXACIN 500 MG PO TABS
500.0000 mg | ORAL_TABLET | Freq: Every day | ORAL | Status: AC
  Administered 2012-06-09: 500 mg via ORAL
  Filled 2012-06-09: qty 1

## 2012-06-09 MED ORDER — DOCUSATE SODIUM 100 MG PO CAPS
100.0000 mg | ORAL_CAPSULE | Freq: Every day | ORAL | Status: DC | PRN
  Administered 2012-06-10: 100 mg via ORAL
  Filled 2012-06-09 (×2): qty 1

## 2012-06-09 MED ORDER — DOCUSATE SODIUM 100 MG PO CAPS
200.0000 mg | ORAL_CAPSULE | Freq: Once | ORAL | Status: AC
  Administered 2012-06-09: 200 mg via ORAL
  Filled 2012-06-09 (×2): qty 2

## 2012-06-09 MED ORDER — CLONIDINE HCL 0.1 MG PO TABS
0.1000 mg | ORAL_TABLET | Freq: Two times a day (BID) | ORAL | Status: DC
  Administered 2012-06-10 – 2012-06-12 (×5): 0.1 mg via ORAL
  Filled 2012-06-09 (×10): qty 1

## 2012-06-09 MED ORDER — AMLODIPINE BESYLATE 5 MG PO TABS
5.0000 mg | ORAL_TABLET | Freq: Every day | ORAL | Status: DC
  Administered 2012-06-09 – 2012-06-12 (×3): 5 mg via ORAL
  Filled 2012-06-09 (×5): qty 1

## 2012-06-09 MED ORDER — LEVOFLOXACIN 250 MG PO TABS
250.0000 mg | ORAL_TABLET | ORAL | Status: DC
  Administered 2012-06-11: 250 mg via ORAL
  Filled 2012-06-09 (×2): qty 1

## 2012-06-09 NOTE — Progress Notes (Signed)
ANTICOAGULATION CONSULT NOTE   Pharmacy Consult for Heparin Indication: 3 vessel CAD  Patient Measurements: Height: 5\' 1"  (154.9 cm) Weight: 136 lb 8 oz (61.916 kg) IBW/kg (Calculated) : 47.8   Vital Signs: Temp: 97.9 F (36.6 C) (11/30 1206) Temp src: Oral (11/30 1206) BP: 119/47 mmHg (11/30 1220) Pulse Rate: 98  (11/30 0941)  Labs:  Basename 06/09/12 1147 06/09/12 0435 06/09/12 0131 06/08/12 1838  HGB -- 9.3* -- 10.8*  HCT -- 28.9* -- 33.4*  PLT -- 271 -- 358  APTT -- -- -- --  LABPROT -- -- -- --  INR -- -- -- --  HEPARINUNFRC 0.74* -- 0.38 --  CREATININE -- 4.76* -- --  CKTOTAL -- -- -- --  CKMB -- -- -- --  TROPONINI -- -- -- --    Estimated Creatinine Clearance: 9 ml/min (by C-G formula based on Cr of 4.76).   Assessment: 71 yo female with 3 vessel CAD s/p cath, awaiting CVTS consult, for Heparin. Heparin level slightly above goal at 0.74.  Goal of Therapy:  Heparin level 0.3-0.7 units/ml Monitor platelets by anticoagulation protocol: Yes   Plan:  Reduce heparin to 800 units/hr. Check heparin level and CBC in AM.  Candie Mile 06/09/2012,12:34 PM

## 2012-06-09 NOTE — Progress Notes (Signed)
[place] for Heparin Indication: 3 vessel CAD  Allergies  Allergen Reactions  . Aspirin Other (See Comments)    Mess up her stomach; "makes my bowels have blood in them". Takes 81 mg EC Aspirin   . Contrast Media (Iodinated Diagnostic Agents) Itching  . Iron Itching    "they gave me iron in dialysis; had to give me Benadryl cause I had to have the iron" (05/02/2012)  . Macrodantin (Nitrofurantoin Macrocrystal) Other (See Comments)    "broke me out in big old knots all over my body; had to go to ER"  . Penicillins Other (See Comments)    "makes me real weak when I take it; like I'll pass out"  . Plavix (Clopidogrel Bisulfate) Rash  . Bactrim (Sulfamethoxazole W-Trimethoprim) Rash  . Sulfa Antibiotics Rash  . Venofer (Ferric Oxide) Itching    Patient reports using Benadryl prior to doses as [facility]    Patient Measurements: Height: 5\' 1"  (154.9 cm) Weight: 134 lb 0.6 oz (60.8 kg) IBW/kg (Calculated) : 47.8   Vital Signs: Temp: 99.4 F (37.4 C) (11/29 2303) Temp src: Oral (11/29 2303) BP: 151/67 mmHg (11/30 0000) Pulse Rate: 92  (11/29 2130)  Labs:  Basename 06/09/12 0131 06/08/12 1838  HGB -- 10.8*  HCT -- 33.4*  PLT -- 358  APTT -- --  LABPROT -- --  INR -- --  HEPARINUNFRC 0.38 --  CREATININE -- --  CKTOTAL -- --  CKMB -- --  TROPONINI -- --    Estimated Creatinine Clearance: 7.6 ml/min (by C-G formula based on Cr of 5.62).   Medical History: Past Medical History  Diagnosis Date  . Essential hypertension, benign   . Chronic bronchitis   . GERD (gastroesophageal reflux disease)   . PUD (peptic ulcer disease)   . History of lower GI bleeding   . Arthritis   . History of gout   . Ovarian cancer 1992  . Colon cancer   . Coronary atherosclerosis of native coronary artery     a. 12/2011 NSTEMI/Cath/PCI LCX (2.25x14 Resolute DES) & D1 (2.25x22 Resolute DES);  b. 01/2012 Cath/PCI: LM 30, LAD 30p, 40-25m, D1 stent  ok, 99 in sm branch of diag, LCX patent stent, OM1 20, RCA 95 ost (4.0x12 Promus DES), EF 55%;  c. 04/2012 Lexi Cardiolite  EF 48%, small area of scar @ base/mid inflat wall with mild peri-infarct ischemia.  . High cholesterol 12/2011  . Chronic diastolic CHF (congestive heart failure)     a. 02/2012 Echo EF 60-65%, nl wall motion, Gr 1 DD, mod MR  . Pneumonia ~ 2009  . Iron deficiency anemia   . TIA (transient ischemic attack)   . Anxiety   . History of blood transfusion 07/2011; 12/2011; 01/2012 X 2; 04/2012  . ESRD (end stage renal disease) on dialysis 12/15/11    a. HD @ [facility]; Mon, Wed, Fri  . Carotid artery disease     a. [id number] LICA, [id number]   . Mitral regurgitation     a. Moderate by echo, 02/2012  . Myocardial infarction     Medications:  Scheduled:    . acetaminophen      . amLODipine  10 mg Oral Daily  . [COMPLETED] aspirin  324 mg Oral Pre-Cath  . aspirin EC  81 mg Oral Daily  . atorvastatin  10 mg Oral q1800  . cloNIDine  0.2 mg Oral BID  . [COMPLETED] diphenhydrAMINE  12.5 mg Intravenous  Once  . [COMPLETED] diphenhydrAMINE  12.5 mg Intravenous Once  . [COMPLETED] diphenhydrAMINE  25 mg Intravenous On Call  . [COMPLETED] famotidine (PEPCID) IV  20 mg Intravenous NOW  . [COMPLETED] fentaNYL      . fluticasone  2 spray Each Nare Daily  . folic acid  1 mg Oral Daily  . [COMPLETED] heparin      . labetalol  200 mg Oral BID  . lanthanum  1,000 mg Oral TID WC  . [COMPLETED] lidocaine      . lisinopril  10 mg Oral Daily  . [COMPLETED] methylPREDNISolone (SOLU-MEDROL) injection  80 mg Intravenous NOW  . metoprolol  5 mg Intravenous Once  . [COMPLETED] midazolam      . [COMPLETED] nitroGLYCERIN      . pantoprazole  40 mg Oral BID  . [DISCONTINUED] darbepoetin (ARANESP) injection - DIALYSIS  100 mcg Intravenous Q Fri-HD  . [DISCONTINUED] doxercalciferol  3.5 mcg Intravenous Q M,W,F-HD  . [DISCONTINUED] Ticagrelor  90 mg Oral BID    Assessment: 71 yo female with 3  vessel CAD s/p cath, awaiting CVTS consult, for Heparin.  Spoke with RN and heparin was restarted in hemodialysis and has been running appropriately since 1830 last night.  Goal of Therapy:  Heparin level 0.3-0.7 units/ml Monitor platelets by anticoagulation protocol: Yes   Plan:  Continue Heparin at current rate Recheck level in 8 hrs to confirm therapeutic.  Caryl Pina 06/09/2012,3:21 AM

## 2012-06-09 NOTE — Progress Notes (Signed)
CARDIAC REHAB PHASE I   PRE:  Rate/Rhythm: 88 SR  BP:  Supine:   Sitting: [id number]  Standing:    SaO2: 93 % RA  MODE:  Ambulation: a couple of feet in the room   POST:  Rate/Rhythem: 89 SR  BP:  Supine:   Sitting: 139/53  Standing:    SaO2: 93% RA   Patient felt fairly well this morning. Patient ambulated a few feet  x2 assist in room. Patient denies any CP or dyspnea. Patient back in bed with call bell in reach to eat her breakfast. Will continue to follow patient on Monday.  08:10-08:40 Shawna [name], [place] Sousa

## 2012-06-09 NOTE — Progress Notes (Signed)
Subjective:  Patient sitting up on edge of bed eating breakfast, looks good.  Said had itching with dialysis last night.  BP has been good since HD even though only removed 1800.  She admits that she does not always take BP meds as prescribed because is worried about BP going too low  Objective Vital signs in last 24 hours: Filed Vitals:   06/09/12 0351 06/09/12 0400 06/09/12 0600 06/09/12 0801  BP:  135/59    Pulse:      Temp: 99.9 F (37.7 C)   98.3 F (36.8 C)  TempSrc: Oral   Oral  Resp:  23 18 20   Height:      Weight:   61.916 kg (136 lb 8 oz)   SpO2: 96%   95%   Weight change: 0 kg (0 lb)  Intake/Output Summary (Last 24 hours) at 06/09/12 0853 Last data filed at 06/09/12 0600  Gross per 24 hour  Intake    618 ml  Output  -1807 ml  Net   2425 ml   Labs: Basic Metabolic Panel:  Lab [id number]  NA 140  K 3.9  CL 99  CO2 27  GLUCOSE 127*  BUN 20  CREATININE 4.76*  CALCIUM 9.6  ALB --  PHOS --   Liver Function Tests: No results found for this basename: AST:3,ALT:3,ALKPHOS:3,BILITOT:3,PROT:3,ALBUMIN:3 in the last 168 hours No results found for this basename: LIPASE:3,AMYLASE:3 in the last 168 hours No results found for this basename: AMMONIA:3 in the last 168 hours CBC:  Lab 06/09/12 0435 06/08/12 1838  WBC 13.0* 11.0*  NEUTROABS -- --  HGB 9.3* 10.8*  HCT 28.9* 33.4*  MCV 94.4 93.6  PLT 271 358   Cardiac Enzymes: No results found for this basename: CKTOTAL:5,CKMB:5,CKMBINDEX:5,TROPONINI:5 in the last 168 hours CBG: No results found for this basename: GLUCAP:5 in the last 168 hours  Iron Studies: No results found for this basename: IRON,TIBC,TRANSFERRIN,FERRITIN in the last 72 hours Studies/Results: Dg Chest 2 View  06/09/2012  *RADIOLOGY REPORT*  Clinical Data: Abnormal chest x-ray follow-up. Rule out pneumonia.  CHEST - 2 VIEW  Comparison: One-view chest 06/07/2012.  Findings: The heart is mildly enlarged.  There is no edema or effusion to suggest  failure. Right lower lobe pneumonia is suspected with new right lower lobe airspace consolidation since the exam of 05/30/2012.  The visualized soft tissues and bony thorax are unremarkable.  IMPRESSION:  1.  Right lower lobe pneumonia. 2.  Mild cardiomegaly without failure.   Original Report Authenticated By: San Morelle, M.D.    Medications: Infusions:    . heparin 900 Units/hr (06/08/12 1800)  . nitroGLYCERIN 25 mcg/min (06/08/12 1100)    Scheduled Medications:    . acetaminophen      . amLODipine  10 mg Oral Daily  . [COMPLETED] aspirin  324 mg Oral Pre-Cath  . aspirin EC  81 mg Oral Daily  . atorvastatin  10 mg Oral q1800  . cloNIDine  0.2 mg Oral BID  . [COMPLETED] diphenhydrAMINE  12.5 mg Intravenous Once  . [COMPLETED] diphenhydrAMINE  12.5 mg Intravenous Once  . [COMPLETED] famotidine (PEPCID) IV  20 mg Intravenous NOW  . [COMPLETED] fentaNYL      . fluticasone  2 spray Each Nare Daily  . folic acid  1 mg Oral Daily  . [COMPLETED] heparin      . labetalol  200 mg Oral BID  . lanthanum  1,000 mg Oral TID WC  . [COMPLETED] lidocaine      .  lisinopril  10 mg Oral Daily  . metoprolol  5 mg Intravenous Once  . [COMPLETED] midazolam      . [COMPLETED] nitroGLYCERIN      . pantoprazole  40 mg Oral BID  . [DISCONTINUED] darbepoetin (ARANESP) injection - DIALYSIS  100 mcg Intravenous Q Fri-HD  . [DISCONTINUED] doxercalciferol  3.5 mcg Intravenous Q M,W,F-HD  . [DISCONTINUED] Ticagrelor  90 mg Oral BID    have reviewed scheduled and prn medications.  Physical Exam: General: sitting up in bed eating breakfast looks well Heart: RRR Lungs: mostly clear Abdomen: soft, nontender Extremities: no edema Dialysis Access: left upper arm AVF with good thrill and bruit.    I Assessment/ Plan: Pt is [name] 71 y.o. yo female with ESRD who was admitted on 06/08/2012 with  Unstable angina, cardiac cath showing restenosis of stents.  Being considered for CABG  Assessment/Plan: 1  Coronary Artery Disease: Appears to have restenosis of stents that were placed 6 months ago. The plan is to have her evaluated for CABG. She is continuing to have chest pain when she is not on nitroglycerin. Therefore, she will likely stay in-house until the time of her cardiac surgery. Plan as per cardiology.  2 ESRD: Normal dialysis schedule is Monday Wednesday Friday at the [facility] unit via an AV fistula. Plan will be for regular dialysis treatment on her regular schedule, next on Monday. While she is on heparin drip we'll try for no heparin with treatment.  3 Hypertension: BP is better after HD and is on ntg as well.   She is currently on Norvasc, clonidine as well as labetalol and Zestril at low doses. She states that she does not always take meds as she is supposed to because BP gets too low.  I think we can simplify her regimen. This will continue to be followed. I will decrease norvasc and clonidine but continue labetalol and ACE for their cardiac properties, these could be titrated up as needed.   4. Anemia of ESRD: Patient receives [name] most 10,000 units of Epogen per treatment. There are no labs as of yet here. ESA will be continued in the form of Aranesp. She receives iron weekly at the dialysis unit. Iron stores will be checked here.  5. Metabolic Bone Disease: Patient is on Fosrenol as an outpatient has been ordered here. She's also on Hectorol which will be continued.   Shawna [name]   06/09/2012,8:53 AM  LOS: 1 day

## 2012-06-09 NOTE — Progress Notes (Signed)
SUBJECTIVE:  She was having itching which is improved.    PHYSICAL EXAM Filed Vitals:   06/09/12 0600 06/09/12 0800 06/09/12 0801 06/09/12 0941  BP:    145/62  Pulse:  85  98  Temp:   98.3 F (36.8 C)   TempSrc:   Oral   Resp: 18  20   Height:      Weight: 136 lb 8 oz (61.916 kg)     SpO2:   95%    General:  No distress HEENT:  PERRL Lungs:  Clear Heart:  RRR Abdomen:  Positive bowel sounds, no rebound no guarding Extremities:  No edema, right groin OK Neuro:  Intact, nonfocal  LABS: Lab Results  Component Value Date   CKTOTAL 224* 01/12/2012   CKMB 5.2* 01/12/2012   TROPONINI 0.47* 05/02/2012   Results for orders placed during the hospital encounter of 06/08/12 (from the past 24 hour(s))  CBC     Status: Abnormal   Collection Time   06/08/12  6:38 PM      Component Value Range   WBC 11.0 (*) 4.0 - 10.5 K/uL   RBC 3.57 (*) 3.87 - 5.11 MIL/uL   Hemoglobin 10.8 (*) 12.0 - 15.0 g/dL   HCT 33.4 (*) 36.0 - 46.0 %   MCV 93.6  78.0 - 100.0 fL   MCH 30.3  26.0 - 34.0 pg   MCHC 32.3  30.0 - 36.0 g/dL   RDW 22.6 (*) 11.5 - 15.5 %   Platelets 358  150 - 400 K/uL  HEPARIN LEVEL (UNFRACTIONATED)     Status: Normal   Collection Time   06/09/12  1:31 AM      Component Value Range   Heparin Unfractionated 0.38  0.30 - 0.70 IU/mL  BASIC METABOLIC PANEL     Status: Abnormal   Collection Time   06/09/12  4:35 AM      Component Value Range   Sodium 140  135 - 145 mEq/L   Potassium 3.9  3.5 - 5.1 mEq/L   Chloride 99  96 - 112 mEq/L   CO2 27  19 - 32 mEq/L   Glucose, Bld 127 (*) 70 - 99 mg/dL   BUN 20  6 - 23 mg/dL   Creatinine, Ser 4.76 (*) 0.50 - 1.10 mg/dL   Calcium 9.6  8.4 - 10.5 mg/dL   GFR calc non Af Amer 8 (*) >90 mL/min   GFR calc Af Amer 10 (*) >90 mL/min  CBC     Status: Abnormal   Collection Time   06/09/12  4:35 AM      Component Value Range   WBC 13.0 (*) 4.0 - 10.5 K/uL   RBC 3.06 (*) 3.87 - 5.11 MIL/uL   Hemoglobin 9.3 (*) 12.0 - 15.0 g/dL   HCT 28.9  (*) 36.0 - 46.0 %   MCV 94.4  78.0 - 100.0 fL   MCH 30.4  26.0 - 34.0 pg   MCHC 32.2  30.0 - 36.0 g/dL   RDW 23.3 (*) 11.5 - 15.5 %   Platelets 271  150 - 400 K/uL    Intake/Output Summary (Last 24 hours) at 06/09/12 1025 Last data filed at 06/09/12 1000  Gross per 24 hour  Intake  676.5 ml  Output  -1807 ml  Net 2483.5 ml    ASSESSMENT AND PLAN:  [place]/CAD -  3 vessel CAD on cath.  For [facility] consult.  Last dose of Brilinta was Thursday.  ESRD: MWF dialysis .  For dialysis today. Appreciate nephrology assistance. Next dialysis scheduled for Monday.  HTN: Poorly controlled. Cont home meds and titrate. Currently titrating IV ntg.   Abnl CXR:  PA lateral CXR with mild pneumonia. WBC is up.  I will go ahead and treat with Levaquin (500 mg initial dose, followed by 250 mg every 48 hours).  I don't see any allergy to this.   Acute on Chronic Diastolic CHF:  Volume management per dialysis.  UTI:  She was recently being treated for a UTI.  I don't know that she completed this I will check a UA.   Jeneen Rinks [facility] 06/09/2012 10:25 AM

## 2012-06-10 LAB — URINALYSIS, ROUTINE W REFLEX MICROSCOPIC
Bilirubin Urine: NEGATIVE
Ketones, ur: NEGATIVE mg/dL
Specific Gravity, Urine: 1.008 (ref 1.005–1.030)
pH: 7.5 (ref 5.0–8.0)

## 2012-06-10 LAB — URINE MICROSCOPIC-ADD ON

## 2012-06-10 LAB — CBC
HCT: 27.3 % — ABNORMAL LOW (ref 36.0–46.0)
MCH: 30.6 pg (ref 26.0–34.0)
MCV: 93.8 fL (ref 78.0–100.0)
RBC: 2.91 MIL/uL — ABNORMAL LOW (ref 3.87–5.11)
WBC: 10.5 10*3/uL (ref 4.0–10.5)

## 2012-06-10 MED ORDER — ASPIRIN EC 81 MG PO TBEC
81.0000 mg | DELAYED_RELEASE_TABLET | Freq: Every day | ORAL | Status: DC
  Administered 2012-06-11 – 2012-06-19 (×8): 81 mg via ORAL
  Filled 2012-06-10 (×12): qty 1

## 2012-06-10 MED ORDER — DOXERCALCIFEROL 4 MCG/2ML IV SOLN
3.0000 ug | INTRAVENOUS | Status: DC
  Administered 2012-06-11: 3 ug via INTRAVENOUS
  Administered 2012-06-16: 4 ug via INTRAVENOUS
  Filled 2012-06-10 (×3): qty 2

## 2012-06-10 NOTE — Progress Notes (Signed)
ANTICOAGULATION CONSULT NOTE   Pharmacy Consult for Heparin Indication: 3 vessel CAD  Patient Measurements: Height: 5\' 1"  (154.9 cm) Weight: 136 lb 8 oz (61.916 kg) IBW/kg (Calculated) : 47.8   Vital Signs: Temp: 98.6 F (37 C) (12/01 0410) Temp src: Oral (12/01 0410) BP: 133/64 mmHg (12/01 0410)  Labs:  Basename 06/10/12 0505 06/09/12 1147 06/09/12 0435 06/09/12 0131 06/08/12 1838  HGB 8.9* -- 9.3* -- --  HCT 27.3* -- 28.9* -- 33.4*  PLT 234 -- 271 -- 358  APTT -- -- -- -- --  LABPROT -- -- -- -- --  INR -- -- -- -- --  HEPARINUNFRC 0.17* 0.74* -- 0.38 --  CREATININE -- -- 4.76* -- --  CKTOTAL -- -- -- -- --  CKMB -- -- -- -- --  TROPONINI -- -- -- -- --    Estimated Creatinine Clearance: 9 ml/min (by C-G formula based on Cr of 4.76).   Assessment: 71 yo female with 3 vessel CAD s/p cath, awaiting CVTS consult, for Heparin. Heparin level (0.17) is below-goal on 900 units/hr. Heparin has been infusing at 900 units/hr since start on 11/29 (Heparin rate was not decreased to 800 units/hr after 0.74 heparin level). Heparin infusion rate of 900 units/hr has provided variable heparin levels (0.38, 0.74, 0.17). No problem with line /infusion per RN.   Goal of Therapy:  Heparin level 0.3-0.7 units/ml Monitor platelets by anticoagulation protocol: Yes   Plan:  1. Increase IV heparin to 1000 units/hr. 2. Heparin level in 8 hours.   Otila Back 06/10/2012,6:05 AM

## 2012-06-10 NOTE — Progress Notes (Signed)
Subjective:  Patient sitting up on edge of bed eating breakfast, looks good.  No new complaints.  Itching better  Objective Vital signs in last 24 hours: Filed Vitals:   06/10/12 0500 06/10/12 0810 06/10/12 0815 06/10/12 0829  BP:   141/64 141/64  Pulse:   81 82  Temp:  97.6 F (36.4 C)    TempSrc:  Oral    Resp:  18 18   Height:   5\' 1"  (1.549 m)   Weight: 63.2 kg (139 lb 5.3 oz)  63.2 kg (139 lb 5.3 oz)   SpO2:  97% 97%    Weight change: 0.1 kg (3.5 oz)  Intake/Output Summary (Last 24 hours) at 06/10/12 0849 Last data filed at 06/10/12 0600  Gross per 24 hour  Intake 370.75 ml  Output      0 ml  Net 370.75 ml   Labs: Basic Metabolic Panel:  Lab [id number]  NA 140  K 3.9  CL 99  CO2 27  GLUCOSE 127*  BUN 20  CREATININE 4.76*  CALCIUM 9.6  ALB --  PHOS --   Liver Function Tests: No results found for this basename: AST:3,ALT:3,ALKPHOS:3,BILITOT:3,PROT:3,ALBUMIN:3 in the last 168 hours No results found for this basename: LIPASE:3,AMYLASE:3 in the last 168 hours No results found for this basename: AMMONIA:3 in the last 168 hours CBC:  Lab 06/10/12 0505 06/09/12 0435 06/08/12 1838  WBC 10.5 13.0* 11.0*  NEUTROABS -- -- --  HGB 8.9* 9.3* 10.8*  HCT 27.3* 28.9* 33.4*  MCV 93.8 94.4 93.6  PLT 234 271 358   Cardiac Enzymes: No results found for this basename: CKTOTAL:5,CKMB:5,CKMBINDEX:5,TROPONINI:5 in the last 168 hours CBG: No results found for this basename: GLUCAP:5 in the last 168 hours  Iron Studies: No results found for this basename: IRON,TIBC,TRANSFERRIN,FERRITIN in the last 72 hours Studies/Results: Dg Chest 2 View  06/09/2012  *RADIOLOGY REPORT*  Clinical Data: Abnormal chest x-ray follow-up. Rule out pneumonia.  CHEST - 2 VIEW  Comparison: One-view chest 06/07/2012.  Findings: The heart is mildly enlarged.  There is no edema or effusion to suggest failure. Right lower lobe pneumonia is suspected with new right lower lobe airspace consolidation  since the exam of 05/30/2012.  The visualized soft tissues and bony thorax are unremarkable.  IMPRESSION:  1.  Right lower lobe pneumonia. 2.  Mild cardiomegaly without failure.   Original Report Authenticated By: San Morelle, M.D.    Medications: Infusions:    . heparin 1,000 Units/hr (06/10/12 0616)  . nitroGLYCERIN 30 mcg/min (06/10/12 0600)    Scheduled Medications:    . [EXPIRED] acetaminophen      . amLODipine  5 mg Oral Daily  . aspirin EC  81 mg Oral Daily  . atorvastatin  10 mg Oral q1800  . cloNIDine  0.1 mg Oral BID  . [COMPLETED] docusate sodium  200 mg Oral Once  . fluticasone  2 spray Each Nare Daily  . folic acid  1 mg Oral Daily  . labetalol  200 mg Oral BID  . lanthanum  1,000 mg Oral TID WC  . levofloxacin  250 mg Oral Q48H  . [COMPLETED] levofloxacin  500 mg Oral Daily  . lisinopril  10 mg Oral Daily  . metoprolol  5 mg Intravenous Once  . pantoprazole  40 mg Oral BID  . [DISCONTINUED] amLODipine  10 mg Oral Daily  . [DISCONTINUED] cloNIDine  0.2 mg Oral BID    have reviewed scheduled and prn medications.  Physical Exam: General: sitting  up in bed eating breakfast looks well Heart: RRR Lungs: mostly clear Abdomen: soft, nontender Extremities: no edema Dialysis Access: left upper arm AVF with good thrill and bruit.    I Assessment/ Plan: Pt is [name] 71 y.o. yo female with ESRD who was admitted on 06/08/2012 with  Unstable angina, cardiac cath showing restenosis of stents.  Being considered for CABG  Assessment/Plan: 1 Coronary Artery Disease: Appears to have restenosis of stents that were placed 6 months ago. The plan is to have her evaluated for CABG. She is continuing to have chest pain when she is not on nitroglycerin. Therefore, she will likely stay in-house until the time of her cardiac surgery. Plan as per cardiology.  2 ESRD: Normal dialysis schedule is Monday Wednesday Friday at the [facility] unit via an AV fistula. Plan will be for  regular dialysis treatment on her regular schedule, next on Monday (tomorrow). While she is on heparin drip we'll try for no heparin with treatment. Now with hgb trending down am worried about heparin- per cards 3 Hypertension: BP is better after HD and is on ntg as well.   She is currently on Norvasc, clonidine as well as labetalol and Zestril at low doses. She states that she does not always take meds as she is supposed to because BP gets too low.  I think we can simplify her regimen. This will continue to be followed. I will decrease norvasc and clonidine but continue labetalol and ACE for their cardiac properties, these could be titrated up as needed.   4. Anemia of ESRD: Patient receives almost 10,000 units of Epogen per treatment. Hgb trending down , 8.9 today. ESA will be continued in the form of Aranesp. She receives iron weekly at the dialysis unit. Iron stores will be checked here.  5. Metabolic Bone Disease: Patient is on Fosrenol as an outpatient has been ordered here. She's also on Hectorol which will be continued.   Shawna [name]   06/10/2012,8:49 AM  LOS: 2 days

## 2012-06-10 NOTE — Progress Notes (Signed)
   SUBJECTIVE:  She was having itching which is improved.    PHYSICAL EXAM Filed Vitals:   06/10/12 0500 06/10/12 0810 06/10/12 0815 06/10/12 0829  BP:   141/64 141/64  Pulse:   81 82  Temp:  97.6 F (36.4 C)    TempSrc:  Oral    Resp:  18 18   Height:   5\' 1"  (1.549 m)   Weight: 139 lb 5.3 oz (63.2 kg)  139 lb 5.3 oz (63.2 kg)   SpO2:  97% 97%    General:  No distress HEENT:  PERRL Lungs:  Clear Heart:  RRR Abdomen:  Positive bowel sounds, no rebound no guarding Extremities:  No edema, right groin OK with echymosis and slight hematoma but not bruit or new pulsatile mass.  Neuro:  Intact, nonfocal  LABS: Lab Results  Component Value Date   CKTOTAL 224* 01/12/2012   CKMB 5.2* 01/12/2012   TROPONINI 0.47* 05/02/2012   Results for orders placed during the hospital encounter of 06/08/12 (from the past 24 hour(s))  HEPARIN LEVEL (UNFRACTIONATED)     Status: Abnormal   Collection Time   06/09/12 11:47 AM      Component Value Range   Heparin Unfractionated 0.74 (*) 0.30 - 0.70 IU/mL  HEPARIN LEVEL (UNFRACTIONATED)     Status: Abnormal   Collection Time   06/10/12  5:05 AM      Component Value Range   Heparin Unfractionated 0.17 (*) 0.30 - 0.70 IU/mL  CBC     Status: Abnormal   Collection Time   06/10/12  5:05 AM      Component Value Range   WBC 10.5  4.0 - 10.5 K/uL   RBC 2.91 (*) 3.87 - 5.11 MIL/uL   Hemoglobin 8.9 (*) 12.0 - 15.0 g/dL   HCT 27.3 (*) 36.0 - 46.0 %   MCV 93.8  78.0 - 100.0 fL   MCH 30.6  26.0 - 34.0 pg   MCHC 32.6  30.0 - 36.0 g/dL   RDW 23.0 (*) 11.5 - 15.5 %   Platelets 234  150 - 400 K/uL    Intake/Output Summary (Last 24 hours) at 06/10/12 1042 Last data filed at 06/10/12 0600  Gross per 24 hour  Intake 337.75 ml  Output      0 ml  Net 337.75 ml    ASSESSMENT AND PLAN:  [place]/CAD -  3 vessel CAD on cath.  For [facility] consult.  Last dose of Brilinta was Thursday.  ESRD: MWF dialysis. Next dialysis scheduled for Monday.  HTN:   Meds titrated  down as the BP has been running low  Abnl CXR:  Questionable pneumonia.  Day 2 of Levaquin  Acute on Chronic Diastolic CHF:  Volume management per dialysis.  UTI:  She was recently being treated for a UTI.  She makes urine usually in the AM and a urinalysis has been ordered.  Groin pain:  Echymosis but no bruit.  If her pain does not slowly improve I will check an ultrasound.   Jeneen Rinks [facility] 06/10/2012 10:42 AM

## 2012-06-10 NOTE — Progress Notes (Signed)
ANTICOAGULATION CONSULT NOTE   Pharmacy Consult for Heparin Indication: 3 vessel CAD  Patient Measurements: Height: 5\' 1"  (154.9 cm) Weight: 139 lb 5.3 oz (63.2 kg) IBW/kg (Calculated) : 47.8   Vital Signs: Temp: 98.1 F (36.7 C) (12/01 1143) Temp src: Oral (12/01 1143) BP: 137/57 mmHg (12/01 1400) Pulse Rate: 82  (12/01 0829)  Labs:  Basename 06/10/12 1348 06/10/12 0505 06/09/12 1147 06/09/12 0435 06/08/12 1838  HGB -- 8.9* -- 9.3* --  HCT -- 27.3* -- 28.9* 33.4*  PLT -- 234 -- 271 358  APTT -- -- -- -- --  LABPROT -- -- -- -- --  INR -- -- -- -- --  HEPARINUNFRC 0.28* 0.17* 0.74* -- --  CREATININE -- -- -- 4.76* --  CKTOTAL -- -- -- -- --  CKMB -- -- -- -- --  TROPONINI -- -- -- -- --    Estimated Creatinine Clearance: 9.1 ml/min (by C-G formula based on Cr of 4.76).   Assessment: 71 yo female with 3 vessel CAD s/p cath, awaiting CVTS consult, for Heparin. Heparin level (0.28) is below-goal on 1000 units/hr. Heparin infusion rate of 900 units/hr has provided variable heparin levels (0.38, 0.74, 0.17). No problem with line /infusion per RN.   Goal of Therapy:  Heparin level 0.3-0.7 units/ml Monitor platelets by anticoagulation protocol: Yes   Plan:  1. Increase IV heparin to 1200 units/hr. 2. Heparin level in 8 hours.   Candie Mile 06/10/2012,2:25 PM

## 2012-06-10 NOTE — Progress Notes (Signed)
ANTICOAGULATION CONSULT NOTE   Pharmacy Consult for Heparin Indication: 3 vessel CAD  Patient Measurements: Height: 5\' 1"  (154.9 cm) Weight: 139 lb 5.3 oz (63.2 kg) IBW/kg (Calculated) : 47.8   Vital Signs: Temp: 98.8 F (37.1 C) (12/01 1929) Temp src: Oral (12/01 1929) BP: 168/74 mmHg (12/01 1929)  Labs:  Basename 06/10/12 2152 06/10/12 1348 06/10/12 0505 06/09/12 0435 06/08/12 1838  HGB -- -- 8.9* 9.3* --  HCT -- -- 27.3* 28.9* 33.4*  PLT -- -- 234 271 358  APTT -- -- -- -- --  LABPROT -- -- -- -- --  INR -- -- -- -- --  HEPARINUNFRC 0.43 0.28* 0.17* -- --  CREATININE -- -- -- 4.76* --  CKTOTAL -- -- -- -- --  CKMB -- -- -- -- --  TROPONINI -- -- -- -- --    Estimated Creatinine Clearance: 9.1 ml/min (by C-G formula based on Cr of 4.76).   Assessment: 71 yo female with 3 vessel CAD s/p cath, awaiting CVTS consult, for heparin. Heparin level (0.43) is at-goal on 1200 units/hr.   Goal of Therapy:  Heparin level 0.3-0.7 units/ml Monitor platelets by anticoagulation protocol: Yes   Plan:  1. Continue IV heparin at 1200 units/hr. 2. Daily CBC, heparin level  Otila Back 06/10/2012,11:46 PM

## 2012-06-11 ENCOUNTER — Other Ambulatory Visit: Payer: Self-pay | Admitting: *Deleted

## 2012-06-11 ENCOUNTER — Encounter (HOSPITAL_COMMUNITY)
Admission: AD | Disposition: A | Discharge status: Skilled Nursing Facility | Payer: Self-pay | Source: Other Acute Inpatient Hospital | Attending: Cardiothoracic Surgery

## 2012-06-11 DIAGNOSIS — I2510 Atherosclerotic heart disease of native coronary artery without angina pectoris: Secondary | ICD-10-CM

## 2012-06-11 DIAGNOSIS — N390 Urinary tract infection, site not specified: Secondary | ICD-10-CM | POA: Diagnosis present

## 2012-06-11 DIAGNOSIS — I059 Rheumatic mitral valve disease, unspecified: Secondary | ICD-10-CM

## 2012-06-11 DIAGNOSIS — I5033 Acute on chronic diastolic (congestive) heart failure: Secondary | ICD-10-CM | POA: Diagnosis present

## 2012-06-11 DIAGNOSIS — R1030 Lower abdominal pain, unspecified: Secondary | ICD-10-CM

## 2012-06-11 DIAGNOSIS — R0989 Other specified symptoms and signs involving the circulatory and respiratory systems: Secondary | ICD-10-CM

## 2012-06-11 LAB — CBC
HCT: 26 % — ABNORMAL LOW (ref 36.0–46.0)
Hemoglobin: 8.7 g/dL — ABNORMAL LOW (ref 12.0–15.0)
MCH: 31.3 pg (ref 26.0–34.0)
MCHC: 33.5 g/dL (ref 30.0–36.0)
MCV: 93.5 fL (ref 78.0–100.0)
Platelets: 220 K/uL (ref 150–400)
RBC: 2.78 MIL/uL — ABNORMAL LOW (ref 3.87–5.11)
RDW: 22.9 % — ABNORMAL HIGH (ref 11.5–15.5)
WBC: 8.4 K/uL (ref 4.0–10.5)

## 2012-06-11 LAB — RENAL FUNCTION PANEL
Albumin: 2.9 g/dL — ABNORMAL LOW (ref 3.5–5.2)
CO2: 22 mEq/L (ref 19–32)
Calcium: 9 mg/dL (ref 8.4–10.5)
GFR calc Af Amer: 4 mL/min — ABNORMAL LOW (ref >90)
GFR calc non Af Amer: 3 mL/min — ABNORMAL LOW (ref >90)
Sodium: 131 mEq/L — ABNORMAL LOW (ref 135–145)

## 2012-06-11 LAB — HEPARIN LEVEL (UNFRACTIONATED): Heparin Unfractionated: 0.35 IU/mL (ref 0.30–0.70)

## 2012-06-11 LAB — IRON AND TIBC: Saturation Ratios: 21 % (ref 20–55)

## 2012-06-11 SURGERY — LEFT HEART CATHETERIZATION WITH CORONARY ANGIOGRAM
Anesthesia: LOCAL

## 2012-06-11 MED ORDER — LIDOCAINE HCL (PF) 1 % IJ SOLN: 5.0000 mL | INTRAMUSCULAR | Status: DC | PRN

## 2012-06-11 MED ORDER — LIDOCAINE-PRILOCAINE 2.5-2.5 % EX CREA
1.0000 "application " | TOPICAL_CREAM | CUTANEOUS | Status: DC | PRN
  Filled 2012-06-11: qty 5

## 2012-06-11 MED ORDER — DOXERCALCIFEROL 4 MCG/2ML IV SOLN
INTRAVENOUS | Status: AC
  Administered 2012-06-11: 3 ug via INTRAVENOUS
  Filled 2012-06-11: qty 2

## 2012-06-11 MED ORDER — DARBEPOETIN ALFA-POLYSORBATE 100 MCG/0.5ML IJ SOLN
100.0000 ug | INTRAMUSCULAR | Status: DC
  Filled 2012-06-11: qty 0.5

## 2012-06-11 MED ORDER — HYDROXYZINE HCL 25 MG PO TABS
ORAL_TABLET | ORAL | Status: AC
  Administered 2012-06-11: 25 mg via ORAL
  Filled 2012-06-11: qty 1

## 2012-06-11 MED ORDER — ALTEPLASE 2 MG IJ SOLR
2.0000 mg | Freq: Once | INTRAMUSCULAR | Status: DC | PRN
  Filled 2012-06-11: qty 2

## 2012-06-11 MED ORDER — HEPARIN SODIUM (PORCINE) 1000 UNIT/ML DIALYSIS: 1000.0000 [IU] | INTRAMUSCULAR | Status: DC | PRN

## 2012-06-11 MED ORDER — POTASSIUM CHLORIDE CRYS ER 20 MEQ PO TBCR
EXTENDED_RELEASE_TABLET | ORAL | Status: AC
  Filled 2012-06-11: qty 1

## 2012-06-11 MED ORDER — NEPRO/CARBSTEADY PO LIQD
237.0000 mL | ORAL | Status: DC | PRN
  Filled 2012-06-11: qty 237

## 2012-06-11 MED ORDER — DIPHENHYDRAMINE HCL 50 MG/ML IJ SOLN
INTRAMUSCULAR | Status: AC
  Administered 2012-06-11: 25 mg via INTRAVENOUS
  Filled 2012-06-11: qty 1

## 2012-06-11 MED ORDER — DARBEPOETIN ALFA-POLYSORBATE 100 MCG/0.5ML IJ SOLN
INTRAMUSCULAR | Status: AC
  Filled 2012-06-11: qty 0.5

## 2012-06-11 MED ORDER — HEPARIN SODIUM (PORCINE) 1000 UNIT/ML DIALYSIS: 20.0000 [IU]/kg | INTRAMUSCULAR | Status: DC | PRN

## 2012-06-11 MED ORDER — SODIUM CHLORIDE 0.9 % IV SOLN: 100.0000 mL | INTRAVENOUS | Status: DC | PRN

## 2012-06-11 MED ORDER — PENTAFLUOROPROP-TETRAFLUOROETH EX AERO: 1.0000 "application " | INHALATION_SPRAY | CUTANEOUS | Status: DC | PRN

## 2012-06-11 MED ORDER — PENTAFLUOROPROP-TETRAFLUOROETH EX AERO
INHALATION_SPRAY | CUTANEOUS | Status: AC
  Administered 2012-06-11: 1
  Filled 2012-06-11: qty 103.5

## 2012-06-11 MED ORDER — RENA-VITE PO TABS
1.0000 | ORAL_TABLET | Freq: Every day | ORAL | Status: DC
  Administered 2012-06-11 – 2012-06-16 (×5): 1 via ORAL
  Filled 2012-06-11 (×8): qty 1

## 2012-06-11 MED FILL — Nitroglycerin IV Soln 200 MCG/ML in D5W: INTRAVENOUS | Qty: 250 | Status: AC

## 2012-06-11 NOTE — Care Management Note (Addendum)
    Page 1 of 2   06/20/2012     3:36:01 PM   CARE MANAGEMENT NOTE 06/20/2012  Patient:  ALEEHA, LOGALBO   Account Number:  [id number]  Date Initiated:  06/11/2012  Documentation initiated by:  Elissa Hefty  Subjective/Objective Assessment:   adm w ch pain     Action/Plan:   lives w husband, pcp dr [name], hx of [facility]   Anticipated DC Date:  06/20/2012   Anticipated DC Plan:  [place]  In-house referral  Clinical Social Worker      DC Planning Services  CM consult      Choice offered to / List presented to:             Status of service:  Completed, signed off Medicare Important Message given?   (If response is "NO", the following Medicare IM given date fields will be blank) Date Medicare IM given:   Date Additional Medicare IM given:    Discharge Disposition:  [place]  Per UR Regulation:  Reviewed for med. necessity/level of care/duration of stay  If discussed at [place] of Stay Meetings, dates discussed:    Comments:  06/20/12 Christan Ciccarelli,RN,BSN [id number] PT [place] TO SNF TODAY, PER CSW ARRANGEMENTS.  06-15-12 2:40pm Luz Lex, RNBSN - [phone] Sitting up in chair.  Looks good.  States realy weak - hurts!.  She feels she will need rehab for strengthening prior to going home.  Is ESRD with dialysis at [facility] facility.  Interested in [facility] in [place] for [place] rehab. SW consult placed.  06-13-12 3:30pm Luz Lex, RNBSN [phone] Post op CABG x4 today.  Lives with husband - CM will continue to follow.  12/2 12n debbie dowell rn,bsn [id number] pt act w [facility] [phone] have alerted them of pt adm.

## 2012-06-11 NOTE — Progress Notes (Signed)
Right groin duplex has been completed. Evaluating for hematoma vs DVT vs pseudoaneurysm.  PRELIMINARY FINDINGS  No evidence of DVT. No evidence of hematoma.  Positive for right groin pseudoaneurysm.   Landry Mellow, RDMS, RVT  06/11/2012

## 2012-06-11 NOTE — Progress Notes (Signed)
Ignacia Bayley NP called with [place] results of psuedoanyrsm. Will keep on bedrest.

## 2012-06-11 NOTE — Procedures (Signed)
Pt seen on HD.  Ap 170 Vp 180.  CO pruritis from cath dye.  Says she has had this reaction before.  Will start aranesp and renavite.  Called her home unit to get info.

## 2012-06-11 NOTE — Progress Notes (Signed)
Patient ID: Shawna [name], female   DOB: 1941/06/23, 71 y.o.   MRN: [id number]   SUBJECTIVE:  The patient is now back from dialysis. She is looking forward to the planning for her bypass surgery. She mentions that she had some itching on the day of her catheterization. She also says she had some today.   Filed Vitals:   06/11/12 0822 06/11/12 0827 06/11/12 0830 06/11/12 0835  BP: 122/60 123/59 123/59 131/56  Pulse: 76 77 74 75  Temp:      TempSrc:      Resp:  20 16   Height:      Weight:      SpO2:        Intake/Output Summary (Last 24 hours) at 06/11/12 0843 Last data filed at 06/11/12 0600  Gross per 24 hour  Intake 486.57 ml  Output    200 ml  Net 286.57 ml    LABS: Basic Metabolic Panel:  Basename 06/11/12 0728 06/09/12 0435  NA 131* 140  K 4.6 3.9  CL 91* 99  CO2 22 27  GLUCOSE 98 127*  BUN 59* 20  CREATININE 10.84* 4.76*  CALCIUM 9.0 9.6  MG -- --  PHOS 4.8* --   Liver Function Tests:  Basename 06/11/12 0728  AST --  ALT --  ALKPHOS --  BILITOT --  PROT --  ALBUMIN 2.9*   No results found for this basename: LIPASE:2,AMYLASE:2 in the last 72 hours CBC:  Basename 06/11/12 0500 06/10/12 0505  WBC 8.4 10.5  NEUTROABS -- --  HGB 8.7* 8.9*  HCT 26.0* 27.3*  MCV 93.5 93.8  PLT 220 234   Cardiac Enzymes: No results found for this basename: CKTOTAL:3,CKMB:3,CKMBINDEX:3,TROPONINI:3 in the last 72 hours BNP: No components found with this basename: POCBNP:3 D-Dimer: No results found for this basename: DDIMER:2 in the last 72 hours Hemoglobin A1C: No results found for this basename: HGBA1C in the last 72 hours Fasting Lipid Panel: No results found for this basename: CHOL,HDL,LDLCALC,TRIG,CHOLHDL,LDLDIRECT in the last 72 hours Thyroid Function Tests: No results found for this basename: TSH,T4TOTAL,FREET3,T3FREE,THYROIDAB in the last 72 hours  RADIOLOGY: Dg Chest 2 View  06/09/2012  *RADIOLOGY REPORT*  Clinical Data: Abnormal chest x-ray follow-up.  Rule out pneumonia.  CHEST - 2 VIEW  Comparison: One-view chest 06/07/2012.  Findings: The heart is mildly enlarged.  There is no edema or effusion to suggest failure. Right lower lobe pneumonia is suspected with new right lower lobe airspace consolidation since the exam of 05/30/2012.  The visualized soft tissues and bony thorax are unremarkable.  IMPRESSION:  1.  Right lower lobe pneumonia. 2.  Mild cardiomegaly without failure.   Original Report Authenticated By: San Morelle, M.D.     PHYSICAL EXAM  Patient is oriented to person time and place. Affect is normal. There is no jugulovenous distention. Lungs reveal a few scattered rhonchi. Cardiac exam reveals S1 and S2. There no clicks or significant murmurs. The abdomen is soft. There is no peripheral edema.   TELEMETRY: I have reviewed telemetry today June 11, 2012. There is normal sinus rhythm.   ASSESSMENT AND PLAN:   Coronary atherosclerosis of native coronary artery     Patient is being assessed for bypass surgery.   Essential hypertension, benign   ESRD (end stage renal disease)     Patient was dialyzed today. She is stable.   Non-STEMI (non-ST elevated myocardial infarction)    She's not having any recurrent pai   Acute on chronic diastolic CHF (  congestive heart failure), NYHA class 1   Her volume status is now stable.   UTI (urinary tract infection)    She is on antibiotics  Contrast allergy   The patient did receive some medications to help with her history of contrast allergy. She says that she had itching the day of her catheterization. However it she said she had some additional itching today when in dialysis. This will have to be followed.   Dola Argyle 06/11/2012 8:43 AM

## 2012-06-11 NOTE — Consult Note (Signed)
[address]            [place] [address]          [id number]    PCP is [name], MD Referring Provider is  Dr. Julianne Handler  Chief complaint: Chest pain and recent right groin pain after cardiac cath   History of Presenting Illness: This is a 71 year old [place] American female, with multiple medical problems, who states she developed chest pain  on 06/06/2012. She has had multiple admissions with recurrent chest pain, the last of which was approximately one month ago. In October she was admitted [facility]  with complaints of classic cardiac chest pain but because of report of recent negative stress test further  cardiac workup was not done.   She is s/p stenting of the Diag & LCX in June 2013.She then had recurrent chest pain in July and stenting of the ostial RCA.Her most recent episode of chest pain occurred the evening of 06/06/2012 while she was sitting and watching TV. She developed sudden onset of 10/10 substernal chest pain with radiation up to her neck, down her left arm, and to the midscapular area. She presented to [facility] where ECG showed  st depression with t changes and poor R progression. ST changes were new. She was treated with morphine, ntg, and heparin. Troponin was mildly elevated @ 0.34 and subsequently rose to 0.36. Her chest pain improved some with the aforementioned interventions but had not completely resolved. She was transferred to [facility] for further evaluation.Her symptoms did improve with titration of NTG.  She underwent a cardiac catheterization by Dr. Julianne Handler on 06/08/2012. She was found to have a 60-70% stenosis of the distal LAD, a 99% stenosis proximal to circumflex stent, 99% stenosis of OM1, and a 99% restenosis of the stented RCA. A cardiothoracic consultation has been requested today for the consideration of coronary artery bypass grafting surgery.  She denies chest pain or shortness of breath since  admission . She does have complaints of right groin pain since cath.   Past Medical History: Past Medical History  Diagnosis Date  . Essential hypertension, benign   . Chronic bronchitis   . GERD (gastroesophageal reflux disease)   . PUD (peptic ulcer disease)   . History of lower GI bleeding   . Arthritis   . History of gout   . Ovarian cancer 1992  . Colon cancer   . Coronary atherosclerosis of native coronary artery     a. 12/2011 NSTEMI/Cath/PCI LCX (2.25x14 Resolute DES) & D1 (2.25x22 Resolute DES);  b. 01/2012 Cath/PCI: LM 30, LAD 30p, 40-74m, D1 stent ok, 99 in sm branch of diag, LCX patent stent, OM1 20, RCA 95 ost (4.0x12 Promus DES), EF 55%;  c. 04/2012 Lexi Cardiolite  EF 48%, small area of scar @ base/mid inflat wall with mild peri-infarct ischemia.  . High cholesterol 12/2011  . Chronic diastolic CHF (congestive heart failure)     a. 02/2012 Echo EF 60-65%, nl wall motion, Gr 1 DD, mod MR  . Pneumonia ~ 2009  . Iron deficiency anemia   . TIA (transient ischemic attack)   . Anxiety   . History of blood transfusion 07/2011; 12/2011; 01/2012 X 2; 04/2012  . ESRD (end stage renal disease) on dialysis 12/15/11    a. HD @ [facility]; Mon, Wed, Fri  . Carotid artery disease  a. [id number] LICA, [id number]   . Mitral regurgitation     a. Moderate by echo, 02/2012  . Myocardial infarction     Past Surgical History: Past Surgical History  Procedure Date  . Abdominal hysterectomy 1992  . Appendectomy 06/1990  . Tubal ligation 1980's  . Av fistula placement 07/2009    left upper arm  . Thrombectomy / arteriovenous graft revision 2011    left upper arm  . Colon resection 1992  . Esophagogastroduodenoscopy 01/20/2012    Procedure: ESOPHAGOGASTRODUODENOSCOPY (EGD);  Surgeon: Ladene Artist, MD,FACG;  Location: [facility] ENDOSCOPY;  Service: Endoscopy;  Laterality: N/A;  . Dilation and curettage of uterus   . Colon surgery   . Coronary angioplasty with stent placement 12/15/11    "2"  .  Coronary angioplasty with stent placement y/2013    "1; makes total of 3" (05/02/2012)    Family History: Family History  Problem Relation Alive or deceased   Heart attack, diabetes Mother Deceased at age 59  . Heart problems, kidney disease                           Father Deceased at age 75                                                 Social History History  Substance Use Topics  . Smoking status: Never Smoker   . Smokeless tobacco: Never Used  . Alcohol Use: No    Current Facility-Administered Medications  Medication Dose Route Frequency Provider Last Rate Last Dose  . 0.9 %  sodium chloride infusion  100 mL Intravenous PRN Louis Meckel, MD      . 0.9 %  sodium chloride infusion  100 mL Intravenous PRN Louis Meckel, MD      . acetaminophen (TYLENOL) tablet 650 mg  650 mg Oral Q6H PRN Louis Meckel, MD       Or  . acetaminophen (TYLENOL) suppository 650 mg  650 mg Rectal Q6H PRN Louis Meckel, MD      . ALPRAZolam Duanne Moron) tablet 0.25-0.5 mg  0.25-0.5 mg Oral QHS PRN Rogelia Mire, NP   0.25 mg at 06/08/12 1315  . alteplase (CATHFLO ACTIVASE) injection 2 mg  2 mg Intracatheter Once PRN Louis Meckel, MD      . amLODipine (NORVASC) tablet 5 mg  5 mg Oral Daily Louis Meckel, MD   5 mg at 06/10/12 2152  . aspirin EC tablet 81 mg  81 mg Oral Daily Rhonda G Barrett, PA      . atorvastatin (LIPITOR) tablet 10 mg  10 mg Oral q1800 Rogelia Mire, NP   10 mg at 06/10/12 1729  . calcium carbonate (dosed in mg elemental calcium) suspension 500 mg of elemental calcium  500 mg of elemental calcium Oral Q6H PRN Louis Meckel, MD      . camphor-menthol [facility]) lotion 1 application  1 application Topical [id number] PRN Louis Meckel, MD       And  . hydrOXYzine (ATARAX/VISTARIL) tablet 25 mg  25 mg Oral Q8H PRN Louis Meckel, MD   25 mg at 06/11/12 1019  . cloNIDine (CATAPRES) tablet 0.1 mg  0.1 mg Oral BID Louis Meckel, MD   0.1 mg at 06/11/12  0012  . darbepoetin (ARANESP) injection 100 mcg  100 mcg Intravenous Q Fri-HD Windy Kalata, MD      . diphenhydrAMINE (BENADRYL) injection 25 mg  25 mg Intravenous Q6H PRN Pelbreton C. Colon Flattery, MD   25 mg at 06/11/12 0905  . docusate sodium (COLACE) capsule 100 mg  100 mg Oral Daily PRN Lendon Colonel, NP   100 mg at 06/10/12 2152  . docusate sodium (ENEMEEZ) enema 283 mg  1 enema Rectal PRN Louis Meckel, MD      . doxercalciferol (HECTOROL) injection 3 mcg  3 mcg Intravenous Q M,W,F-HD Louis Meckel, MD   3 mcg at 06/11/12 1106  . feeding supplement (NEPRO CARB STEADY) liquid 237 mL  237 mL Oral TID PRN Louis Meckel, MD      . feeding supplement (NEPRO CARB STEADY) liquid 237 mL  237 mL Oral PRN Louis Meckel, MD      . fluticasone (FLONASE) 50 MCG/ACT nasal spray 2 spray  2 spray Each Nare Daily Rogelia Mire, NP   2 spray at 06/10/12 0950  . folic acid (FOLVITE) tablet 1 mg  1 mg Oral Daily Rogelia Mire, NP   1 mg at 06/10/12 0950  . heparin ADULT infusion 100 units/mL (25000 units/250 mL)  1,200 Units/hr Intravenous Continuous Deboraha Sprang, MD 12 mL/hr at 06/11/12 0600 1,200 Units/hr at 06/11/12 0600  . heparin injection 1,000 Units  1,000 Units Dialysis PRN Louis Meckel, MD      . heparin injection 1,200 Units  20 Units/kg Dialysis PRN Louis Meckel, MD      . labetalol (NORMODYNE) tablet 200 mg  200 mg Oral BID Rogelia Mire, NP   200 mg at 06/10/12 2051  . lanthanum (FOSRENOL) chewable tablet 1,000 mg  1,000 mg Oral TID WC Rogelia Mire, NP   1,000 mg at 06/10/12 1729  . levofloxacin (LEVAQUIN) tablet 250 mg  250 mg Oral Q48H Minus Breeding, MD      . lidocaine (XYLOCAINE) 1 % injection 5 mL  5 mL Intradermal PRN Louis Meckel, MD      . lidocaine-prilocaine (EMLA) cream 1 application  1 application Topical PRN Louis Meckel, MD      . lisinopril  (PRINIVIL,ZESTRIL) tablet 10 mg  10 mg Oral Daily Rogelia Mire, NP   10 mg at 06/10/12 1400  . loratadine (CLARITIN) tablet 10 mg  10 mg Oral Daily PRN Rogelia Mire, NP   10 mg at 06/08/12 1504  . metoprolol (LOPRESSOR) injection 5 mg  5 mg Intravenous Once Rogelia Mire, NP      . multivitamin (RENA-VIT) tablet 1 tablet  1 tablet Oral Daily Windy Kalata, MD      . nitroGLYCERIN (NITROSTAT) SL tablet 0.4 mg  0.4 mg Sublingual Q5 Min x 3 PRN Rogelia Mire, NP      . nitroGLYCERIN 0.2 mg/mL in dextrose 5 % infusion  3-30 mcg/min Intravenous Titrated Rogelia Mire, NP 9 mL/hr at 06/11/12 0600 30 mcg/min at 06/11/12 0600  . ondansetron (ZOFRAN) injection 4 mg  4 mg Intravenous Q6H PRN Rogelia Mire, NP      . ondansetron [facility]) tablet 4 mg  4 mg Oral Q6H PRN Louis Meckel, MD       Or  . ondansetron (ZOFRAN) injection 4 mg  4 mg Intravenous Q6H PRN Louis Meckel, MD      .  pantoprazole (PROTONIX) EC tablet 40 mg  40 mg Oral BID Rogelia Mire, NP   40 mg at 06/10/12 2152  . pentafluoroprop-tetrafluoroeth (GEBAUERS) aerosol 1 application  1 application Topical PRN Louis Meckel, MD      . pentafluoroprop-tetrafluoroeth (GEBAUERS) aerosol           . sorbitol 70 % solution 30 mL  30 mL Oral PRN Louis Meckel, MD      . traMADol Veatrice Bourbon) tablet 25 mg  25 mg Oral Q12H PRN Rogelia Mire, NP   25 mg at 06/10/12 0052  . zolpidem (AMBIEN) tablet 5 mg  5 mg Oral QHS PRN Louis Meckel, MD      . [DISCONTINUED] aspirin EC tablet 81 mg  81 mg Oral Daily Rogelia Mire, NP   81 mg at 06/10/12 0950  . [DISCONTINUED] darbepoetin (ARANESP) 100 MCG/0.5ML injection             Allergies: Allergies  Allergen Reactions  . Aspirin Other (See Comments)    Mess up her stomach; "makes my bowels have blood in them". Takes 81 mg EC Aspirin   . Contrast Media (Iodinated Diagnostic Agents) Itching  . Iron Itching     "they gave me iron in dialysis; had to give me Benadryl cause I had to have the iron" (05/02/2012)  . Macrodantin (Nitrofurantoin Macrocrystal) Other (See Comments)    "broke me out in big old knots all over my body; had to go to ER"  . Penicillins Other (See Comments)    "makes me real weak when I take it; like I'll pass out"  . Plavix (Clopidogrel Bisulfate) Rash  . Bactrim (Sulfamethoxazole W-Trimethoprim) Rash  . Sulfa Antibiotics Rash  . Venofer (Ferric Oxide) Itching    Patient reports using Benadryl prior to doses as [place]: Cardiac Review of Systems: Y or N  Chest Pain [ y ] Resting SOB [ n ] Exertional SOB [ n ] Orthopnea [n ]  Pedal Edema [ n ] Palpitations [ n] Syncope [ n ] Presyncope [ n ]  General Review of Systems: [Y] = yes [ ] =no  Constitional:  nausea [n ]; night sweats [n ]; fever [n ]; or chills [n ];  Eye : blurred vision [n ]; diplopia [ n]; vision changes [n ]; Amaurosis fugax[ n ];  Resp: cough [n ]; wheezing[n ]; hemoptysis[ n]; shortness of breath[n ]; paroxysmal nocturnal dyspnea[n ]; dyspnea on exertion[ n]; or orthopnea[ n];  GI:  vomiting[n ]; dysphagia[n ]; melena[n ]; hematochezia [ n]; heartburn[ n] GU: hematuria[n ]; dysuria [ n]; nocturia[ ] ; Skin: rash, swelling[ ] ;, hair loss[ ] ; peripheral edema[ ] ; or itching[ ] ;  Musculosketetal: myalgias[n ]; joint swelling[ n ]; joint erythema[ n];  joint pain[ ] ; back pain[ n ];  Neuro: TIA[n ]; headaches[ ] ; stroke[ n]; vertigo[ n]; seizures[ n]; paresthesias[ n]; difficulty walking[ n];  Psych:depression[ n]; anxiety[n ];  Endocrine: diabetes[ ] ; thyroid dysfunction[ ] ;  Immunizations: Flu [n ]; Pneumococcal[ n ];    BP 148/65  Pulse 70  Temp 98.2 F (36.8 C) (Oral)  Resp 16  Ht 5\' 1"  (1.549 m)  Wt 143 lb 4.8 oz (65 kg)  BMI 27.08 kg/m2  SpO2 95%  Physical Exam:General appearance: alert, cooperative and no distress HEENT:  Head is atraumatic, normal cephalic, wears a  wig; Eyes:EOMI and PERRLA; Neck is supple, no JVD, or carotid bruit Heart: regular rate and  rhythm, S1, S2 normal, no murmur, click, rub or gallop, no murmur of MR Lungs: clear to auscultation bilaterally. No rales, wheezes, or rhonchi. Abdomen: soft, non-tender; bowel sounds normal; no masses,  no organomegaly Extremities: extremities normal, atraumatic, no cyanosis or edema. Palpable DP and PT bilaterally. There is ecchymosis and swelling of right groin. There is no pulsatile mass. Neurologic: intact without focal deficits No carotid bruits, left upper forearm graft patent   Diagnostic Studies and Lab Results: Results for orders placed during the hospital encounter of 06/08/12 (from the past 48 hour(s))  HEPARIN LEVEL (UNFRACTIONATED)     Status: Abnormal   Collection Time   06/09/12 11:47 AM      Component Value Range Comment   Heparin Unfractionated 0.74 (*) 0.30 - 0.70 IU/mL   HEPARIN LEVEL (UNFRACTIONATED)     Status: Abnormal   Collection Time   06/10/12  5:05 AM      Component Value Range Comment   Heparin Unfractionated 0.17 (*) 0.30 - 0.70 IU/mL   CBC     Status: Abnormal   Collection Time   06/10/12  5:05 AM      Component Value Range Comment   WBC 10.5  4.0 - 10.5 K/uL    RBC 2.91 (*) 3.87 - 5.11 MIL/uL    Hemoglobin 8.9 (*) 12.0 - 15.0 g/dL    HCT 27.3 (*) 36.0 - 46.0 %    MCV 93.8  78.0 - 100.0 fL    MCH 30.6  26.0 - 34.0 pg    MCHC 32.6  30.0 - 36.0 g/dL    RDW 23.0 (*) 11.5 - 15.5 %    Platelets 234  150 - 400 K/uL   HEPARIN LEVEL (UNFRACTIONATED)     Status: Abnormal   Collection Time   06/10/12  1:48 PM      Component Value Range Comment   Heparin Unfractionated 0.28 (*) 0.30 - 0.70 IU/mL   URINALYSIS, ROUTINE W REFLEX MICROSCOPIC     Status: Abnormal   Collection Time   06/10/12  5:31 PM      Component Value Range Comment   Color, Urine YELLOW  YELLOW    APPearance CLEAR  CLEAR    Specific Gravity, Urine 1.008  1.005 - 1.030    pH 7.5  5.0 - 8.0     Glucose, UA NEGATIVE  NEGATIVE mg/dL    Hgb urine dipstick SMALL (*) NEGATIVE    Bilirubin Urine NEGATIVE  NEGATIVE    Ketones, ur NEGATIVE  NEGATIVE mg/dL    Protein, ur 100 (*) NEGATIVE mg/dL    Urobilinogen, UA 0.2  0.0 - 1.0 mg/dL    Nitrite NEGATIVE  NEGATIVE    Leukocytes, UA NEGATIVE  NEGATIVE   URINE MICROSCOPIC-ADD ON     Status: Normal   Collection Time   06/10/12  5:31 PM      Component Value Range Comment   Squamous Epithelial / LPF RARE  RARE    WBC, UA 0-2  <3 WBC/hpf    RBC / HPF 0-2  <3 RBC/hpf   HEPARIN LEVEL (UNFRACTIONATED)     Status: Normal   Collection Time   06/10/12  9:52 PM      Component Value Range Comment   Heparin Unfractionated 0.43  0.30 - 0.70 IU/mL   HEPARIN LEVEL (UNFRACTIONATED)     Status: Normal   Collection Time   06/11/12  5:00 AM      Component Value Range Comment   Heparin Unfractionated  0.35  0.30 - 0.70 IU/mL   CBC     Status: Abnormal   Collection Time   06/11/12  5:00 AM      Component Value Range Comment   WBC 8.4  4.0 - 10.5 K/uL    RBC 2.78 (*) 3.87 - 5.11 MIL/uL    Hemoglobin 8.7 (*) 12.0 - 15.0 g/dL    HCT 26.0 (*) 36.0 - 46.0 %    MCV 93.5  78.0 - 100.0 fL    MCH 31.3  26.0 - 34.0 pg    MCHC 33.5  30.0 - 36.0 g/dL    RDW 22.9 (*) 11.5 - 15.5 %    Platelets 220  150 - 400 K/uL   RENAL FUNCTION PANEL     Status: Abnormal   Collection Time   06/11/12  7:28 AM      Component Value Range Comment   Sodium 131 (*) 135 - 145 mEq/L    Potassium 4.6  3.5 - 5.1 mEq/L    Chloride 91 (*) 96 - 112 mEq/L    CO2 22  19 - 32 mEq/L    Glucose, Bld 98  70 - 99 mg/dL    BUN 59 (*) 6 - 23 mg/dL    Creatinine, Ser 10.84 (*) 0.50 - 1.10 mg/dL    Calcium 9.0  8.4 - 10.5 mg/dL    Phosphorus 4.8 (*) 2.3 - 4.6 mg/dL    Albumin 2.9 (*) 3.5 - 5.2 g/dL    GFR calc non Af Amer 3 (*) >90 mL/min    GFR calc Af Amer 4 (*) >90 mL/min    Diagnostic Studies: Cardiac catheterization done by Dr. Julianne Handler on 06/08/2012: Angiographic Findings:  Left  main: 40% distal stenosis.  Left Anterior Descending Artery: Large caliber vessel that courses to the apex. The proximal vessel has 30-40% stenosis. The mid vessel has a 40% stenosis. The mid to distal vessel has a hazy 60-70% stenosis. The diagonal is a moderate sized vessel  Intermediate artery: Moderate caliber vessel with ostial 30% stenosis.  Circumflex Artery: Moderate caliber vessel with proximal stent. There is 99% restenosis within the proximal edge of the stent. The first marginal branch is small to moderate sized with 99% ostial stenosis. This vessel arises from the distal portion of the stented segment.  Right Coronary Artery: Very large caliber, dominant vessel with ostial stent with 99% restenosis in the stented segment beginning at the ostium. The mid and distal vessel has mild plaque disease.  Left Ventricular Angiogram: LVEF=40%, global hypokinesis  2. Chest X ray done 06/09/2012: RADIOLOGY REPORT*  Clinical Data: Abnormal chest x-ray follow-up. Rule out pneumonia.  CHEST - 2 VIEW  Comparison: One-view chest 06/07/2012.  Findings: The heart is mildly enlarged. There is no edema or  effusion to suggest failure. Right lower lobe pneumonia is  suspected with new right lower lobe airspace consolidation since  the exam of 05/30/2012. The visualized soft tissues and bony  thorax are unremarkable.  IMPRESSION:  1. Right lower lobe pneumonia.  2. Mild cardiomegaly without failure.  Impression and Plan: 1.History of CAD (s/p PCI with DES to LCX and Diagonal 1 12/2011 and PCI 01/2012 with DES to RCA. Last dose of Brilinta apparently given on 06/08/2012. Now with lv dysfunction,  inferior akinesis was nl in June 2013, Recent inferior MI 2. History of MR on previous echo will Obtain echo. 2.ESRD-HD days are Monday, Wednesday, and Friday 3.Right groin pain-likely hematoma secondary to cath. Check duplex of rt  groin 4.History of HTN 5.History of hyperlipidemia 6. Does not appear to have  pneumonia in spite of Xray reports  I have recommended CABG, after washout of  Brilinta and obtaining  echo to evaluate MR and lv dysfuntion Would recommend dialysis additional time on Tuesday and consider proceeding with surgery WED    Grace Isaac MD  [place] Office [phone] 06/11/2012 5:15 PM

## 2012-06-11 NOTE — Progress Notes (Signed)
*  PRELIMINARY RESULTS* Echocardiogram 2D Echocardiogram has been performed.  Leavy Cella 06/11/2012, 2:05 PM

## 2012-06-11 NOTE — Progress Notes (Signed)
1425 Came to see pt. We will hold ambulation due to right groin pain and need for right duplex. We will continue to follow. Quindell Shere DunlapRN

## 2012-06-12 ENCOUNTER — Inpatient Hospital Stay (HOSPITAL_COMMUNITY): Payer: Medicare Other

## 2012-06-12 DIAGNOSIS — I724 Aneurysm of artery of lower extremity: Secondary | ICD-10-CM

## 2012-06-12 DIAGNOSIS — I340 Nonrheumatic mitral (valve) insufficiency: Secondary | ICD-10-CM | POA: Diagnosis present

## 2012-06-12 DIAGNOSIS — Z01810 Encounter for preprocedural cardiovascular examination: Secondary | ICD-10-CM

## 2012-06-12 DIAGNOSIS — I2510 Atherosclerotic heart disease of native coronary artery without angina pectoris: Secondary | ICD-10-CM

## 2012-06-12 LAB — CBC
MCH: 30 pg (ref 26.0–34.0)
MCHC: 32 g/dL (ref 30.0–36.0)
Platelets: 190 10*3/uL (ref 150–400)
RBC: 2.73 MIL/uL — ABNORMAL LOW (ref 3.87–5.11)
RDW: 23.3 % — ABNORMAL HIGH (ref 11.5–15.5)

## 2012-06-12 LAB — APTT: aPTT: 157 seconds — ABNORMAL HIGH (ref 24–37)

## 2012-06-12 LAB — BLOOD GAS, ARTERIAL
Acid-Base Excess: 5.7 mmol/L — ABNORMAL HIGH (ref 0.0–2.0)
Bicarbonate: 29.4 mEq/L — ABNORMAL HIGH (ref 20.0–24.0)
Drawn by: 23588
FIO2: 0.21 %
O2 Saturation: 97.2 %
Patient temperature: 98.6
TCO2: 30.6 mmol/L (ref 0–100)
pCO2 arterial: 40.5 mmHg (ref 35.0–45.0)
pH, Arterial: 7.473 — ABNORMAL HIGH (ref 7.350–7.450)
pO2, Arterial: 81.9 mmHg (ref 80.0–100.0)

## 2012-06-12 LAB — PROTIME-INR
INR: 1.05 (ref 0.00–1.49)
Prothrombin Time: 13.6 seconds (ref 11.6–15.2)

## 2012-06-12 LAB — COMPREHENSIVE METABOLIC PANEL
ALT: 11 U/L (ref 0–35)
AST: 15 U/L (ref 0–37)
Albumin: 2.6 g/dL — ABNORMAL LOW (ref 3.5–5.2)
Alkaline Phosphatase: 53 U/L (ref 39–117)
BUN: 29 mg/dL — ABNORMAL HIGH (ref 6–23)
CO2: 28 mEq/L (ref 19–32)
Calcium: 9 mg/dL (ref 8.4–10.5)
Chloride: 95 mEq/L — ABNORMAL LOW (ref 96–112)
Creatinine, Ser: 6.58 mg/dL — ABNORMAL HIGH (ref 0.50–1.10)
GFR calc Af Amer: 7 mL/min — ABNORMAL LOW (ref >90)
GFR calc non Af Amer: 6 mL/min — ABNORMAL LOW (ref >90)
Glucose, Bld: 88 mg/dL (ref 70–99)
Potassium: 4.3 mEq/L (ref 3.5–5.1)
Sodium: 134 mEq/L — ABNORMAL LOW (ref 135–145)
Total Bilirubin: 0.3 mg/dL (ref 0.3–1.2)
Total Protein: 5.2 g/dL — ABNORMAL LOW (ref 6.0–8.3)

## 2012-06-12 LAB — PLATELET FUNCTION ASSAY
Collagen / ADP: 229 seconds (ref 0–108)
Collagen / Epinephrine: 229 seconds (ref 0–197)

## 2012-06-12 LAB — SURGICAL PCR SCREEN
MRSA, PCR: NEGATIVE
Staphylococcus aureus: NEGATIVE

## 2012-06-12 LAB — PLATELET INHIBITION P2Y12: Platelet Function  P2Y12: 304 [PRU] (ref 194–418)

## 2012-06-12 LAB — PARATHYROID HORMONE, INTACT (NO CA): PTH: 309.3 pg/mL — ABNORMAL HIGH (ref 14.0–72.0)

## 2012-06-12 MED ORDER — PLASMA-LYTE 148 IV SOLN
INTRAVENOUS | Status: AC
  Administered 2012-06-13: 11:00:00
  Filled 2012-06-12: qty 2.5

## 2012-06-12 MED ORDER — EPINEPHRINE HCL 1 MG/ML IJ SOLN
0.5000 ug/min | INTRAVENOUS | Status: DC
  Filled 2012-06-12: qty 4

## 2012-06-12 MED ORDER — ALBUTEROL SULFATE (5 MG/ML) 0.5% IN NEBU
2.5000 mg | INHALATION_SOLUTION | Freq: Once | RESPIRATORY_TRACT | Status: AC
  Administered 2012-06-12: 2.5 mg via RESPIRATORY_TRACT

## 2012-06-12 MED ORDER — CHLORHEXIDINE GLUCONATE 4 % EX LIQD
60.0000 mL | Freq: Once | CUTANEOUS | Status: AC
  Administered 2012-06-13: 4 via TOPICAL
  Filled 2012-06-12: qty 60

## 2012-06-12 MED ORDER — NITROGLYCERIN IN D5W 200-5 MCG/ML-% IV SOLN
2.0000 ug/min | INTRAVENOUS | Status: DC
  Filled 2012-06-12 (×2): qty 250

## 2012-06-12 MED ORDER — POTASSIUM CHLORIDE 2 MEQ/ML IV SOLN
80.0000 meq | INTRAVENOUS | Status: DC
  Filled 2012-06-12: qty 40

## 2012-06-12 MED ORDER — PHENYLEPHRINE HCL 10 MG/ML IJ SOLN
30.0000 ug/min | INTRAVENOUS | Status: AC
  Administered 2012-06-13: 10 ug/min via INTRAVENOUS
  Filled 2012-06-12: qty 2

## 2012-06-12 MED ORDER — MAGNESIUM SULFATE 50 % IJ SOLN
40.0000 meq | INTRAMUSCULAR | Status: DC
  Filled 2012-06-12: qty 10

## 2012-06-12 MED ORDER — METOPROLOL TARTRATE 12.5 MG HALF TABLET
12.5000 mg | ORAL_TABLET | Freq: Once | ORAL | Status: AC
  Administered 2012-06-13: 12.5 mg via ORAL
  Filled 2012-06-12: qty 1

## 2012-06-12 MED ORDER — VANCOMYCIN HCL 10 G IV SOLR
1250.0000 mg | INTRAVENOUS | Status: AC
  Administered 2012-06-13: 1250 mg via INTRAVENOUS
  Filled 2012-06-12: qty 1250

## 2012-06-12 MED ORDER — CEFUROXIME SODIUM 1.5 G IJ SOLR
1.5000 g | INTRAMUSCULAR | Status: DC
  Filled 2012-06-12: qty 1.5

## 2012-06-12 MED ORDER — NITROGLYCERIN 0.4 MG SL SUBL
SUBLINGUAL_TABLET | SUBLINGUAL | Status: AC
  Administered 2012-06-12: 0.4 mg via SUBLINGUAL
  Filled 2012-06-12: qty 25

## 2012-06-12 MED ORDER — SODIUM CHLORIDE 0.9 % IV SOLN
INTRAVENOUS | Status: AC
  Administered 2012-06-13: 69.8 mL/h via INTRAVENOUS
  Filled 2012-06-12: qty 40

## 2012-06-12 MED ORDER — BISACODYL 5 MG PO TBEC
5.0000 mg | DELAYED_RELEASE_TABLET | Freq: Once | ORAL | Status: DC
  Filled 2012-06-12: qty 1

## 2012-06-12 MED ORDER — DOPAMINE-DEXTROSE 3.2-5 MG/ML-% IV SOLN
2.0000 ug/kg/min | INTRAVENOUS | Status: DC
  Filled 2012-06-12: qty 250

## 2012-06-12 MED ORDER — TEMAZEPAM 15 MG PO CAPS: 15.0000 mg | ORAL_CAPSULE | Freq: Once | ORAL | Status: AC | PRN

## 2012-06-12 MED ORDER — CEFUROXIME SODIUM 750 MG IJ SOLR
750.0000 mg | INTRAMUSCULAR | Status: DC
  Filled 2012-06-12: qty 750

## 2012-06-12 MED ORDER — DEXMEDETOMIDINE HCL IN NACL 400 MCG/100ML IV SOLN
0.1000 ug/kg/h | INTRAVENOUS | Status: AC
  Administered 2012-06-13: 0.3 ug/kg/h via INTRAVENOUS
  Filled 2012-06-12: qty 100

## 2012-06-12 MED ORDER — SODIUM CHLORIDE 0.9 % IV SOLN
INTRAVENOUS | Status: AC
  Administered 2012-06-13: 1.4 [IU]/h via INTRAVENOUS
  Filled 2012-06-12: qty 1

## 2012-06-12 NOTE — Consult Note (Signed)
[facility] [place] CONSULT NOTE 06/12/2012 DOB: [id number] MRN : [id number]  [id number] groin pain S/P cardiac catheter 06/08/2012  Referring Physician: Grace Isaac MD      History of Present Illness: Pt with right femoral pseudoaneurysm on duplex post cardiac cath.  She had some pain yesterday in her groin but this is improved.  No numbness tingling or coolness in foot.  She says swelling is improved today.   She is going to surgery tomorrow for CABG, after  washout of Brilinta and obtaining echo to evaluate MR and lv dysfunction.       Past Medical History  Diagnosis Date  . Essential hypertension, benign   . Chronic bronchitis   . GERD (gastroesophageal reflux disease)   . PUD (peptic ulcer disease)   . History of lower GI bleeding   . Arthritis   . History of gout   . Ovarian cancer 1992  . Colon cancer   . Coronary atherosclerosis of native coronary artery     a. 12/2011 NSTEMI/Cath/PCI LCX (2.25x14 Resolute DES) & D1 (2.25x22 Resolute DES);  b. 01/2012 Cath/PCI: LM 30, LAD 30p, 40-71m, D1 stent ok, 99 in sm branch of diag, LCX patent stent, OM1 20, RCA 95 ost (4.0x12 Promus DES), EF 55%;  c. 04/2012 Lexi Cardiolite  EF 48%, small area of scar @ base/mid inflat wall with mild peri-infarct ischemia.  . High cholesterol 12/2011  . Chronic diastolic CHF (congestive heart failure)     a. 02/2012 Echo EF 60-65%, nl wall motion, Gr 1 DD, mod MR  . Pneumonia ~ 2009  . Iron deficiency anemia   . TIA (transient ischemic attack)   . Anxiety   . History of blood transfusion 07/2011; 12/2011; 01/2012 X 2; 04/2012  . ESRD (end stage renal disease) on dialysis 12/15/11    a. HD @ [facility]; Mon, Wed, Fri  . Carotid artery disease     a. [id number] LICA, [id number]   . Mitral regurgitation     a. Moderate by echo, 02/2012  . Myocardial infarction     Past Surgical History  Procedure Date  . Abdominal hysterectomy 1992  . Appendectomy 06/1990  . Tubal ligation 1980's  .  Av fistula placement 07/2009    left upper arm  . Thrombectomy / arteriovenous graft revision 2011    left upper arm  . Colon resection 1992  . Esophagogastroduodenoscopy 01/20/2012    Procedure: ESOPHAGOGASTRODUODENOSCOPY (EGD);  Surgeon: Ladene Artist, MD,FACG;  Location: [facility] ENDOSCOPY;  Service: Endoscopy;  Laterality: N/A;  . Dilation and curettage of uterus   . Colon surgery   . Coronary angioplasty with stent placement 12/15/11    "2"  . Coronary angioplasty with stent placement y/2013    "1; makes total of 3" (05/02/2012)     ROS: [x]  Positive  [ ]  Denies    General: [ ]  Weight loss, [ ]  Fever, [ ]  chills Neurologic: [ ]  Dizziness, [ ]  Blackouts, [ ]  Seizure [ ]  Stroke, [ ]  "Mini stroke", [ ]  Slurred speech, [ ]  Temporary blindness; [ ]  weakness in arms or legs, [ ]  Hoarseness Cardiac: [x ] Chest pain/pressure, [ ]  Shortness of breath at rest [x ] Shortness of breath with exertion, [ ]  Atrial fibrillation or irregular heartbeat Vascular: [ ]  Pain in legs with walking, [ ]  Pain in legs at rest, [ ]  Pain in legs at night,  [ ]  Non-healing ulcer, [ ]  Blood clot in vein/DVT,  Pulmonary: [ ]  Home oxygen, [ ]  Productive cough, [ ]  Coughing up blood, [ ]  Asthma,  [ ]  Wheezing Musculoskeletal:  [ ]  Arthritis, [ ]  Low back pain, [ ]  Joint pain Hematologic: [x ] Easy Bruising, [ ]  Anemia; [ ]  Hepatitis Gastrointestinal: [ ]  Blood in stool, [ ]  Gastroesophageal Reflux/heartburn, [ ]  Trouble swallowing Urinary: [ ]  chronic Kidney disease, [ ]  on HD - [ ]  MWF or [ ]  TTHS, [ ]  Burning with urination, [ ]  Difficulty urinating Skin: [ ]  Rashes, [ ]  Wounds Psychological: [ ]  Anxiety, [ ]  Depression  Social History History  Substance Use Topics  . Smoking status: Never Smoker   . Smokeless tobacco: Never Used  . Alcohol Use: No    Family History Family History  Problem Relation Age of Onset  . Other      noncontributory for early CAD    Allergies  Allergen Reactions  . Aspirin  Other (See Comments)    Mess up her stomach; "makes my bowels have blood in them". Takes 81 mg EC Aspirin   . Contrast Media (Iodinated Diagnostic Agents) Itching  . Iron Itching    "they gave me iron in dialysis; had to give me Benadryl cause I had to have the iron" (05/02/2012)  . Macrodantin (Nitrofurantoin Macrocrystal) Other (See Comments)    "broke me out in big old knots all over my body; had to go to ER"  . Penicillins Other (See Comments)    "makes me real weak when I take it; like I'll pass out"  . Plavix (Clopidogrel Bisulfate) Rash  . Bactrim (Sulfamethoxazole W-Trimethoprim) Rash  . Sulfa Antibiotics Rash  . Venofer (Ferric Oxide) Itching    Patient reports using Benadryl prior to doses as [place]    Current Facility-Administered Medications  Medication Dose Route Frequency Provider Last Rate Last Dose  . acetaminophen (TYLENOL) tablet 650 mg  650 mg Oral Q6H PRN Louis Meckel, MD       Or  . acetaminophen (TYLENOL) suppository 650 mg  650 mg Rectal Q6H PRN Louis Meckel, MD      . [COMPLETED] albuterol (PROVENTIL) (5 MG/ML) 0.5% nebulizer solution 2.5 mg  2.5 mg Nebulization Once Grace Isaac, MD   2.5 mg at 06/12/12 0826  . ALPRAZolam Duanne Moron) tablet 0.25-0.5 mg  0.25-0.5 mg Oral QHS PRN Rogelia Mire, NP   0.25 mg at 06/08/12 1315  . amLODipine (NORVASC) tablet 5 mg  5 mg Oral Daily Louis Meckel, MD   5 mg at 06/10/12 2152  . aspirin EC tablet 81 mg  81 mg Oral Daily Evelene Croon Barrett, PA   81 mg at 06/11/12 1233  . atorvastatin (LIPITOR) tablet 10 mg  10 mg Oral q1800 Rogelia Mire, NP   10 mg at 06/11/12 1743  . calcium carbonate (dosed in mg elemental calcium) suspension 500 mg of elemental calcium  500 mg of elemental calcium Oral Q6H PRN Louis Meckel, MD      . camphor-menthol [facility]) lotion 1 application  1 application Topical [id number] PRN Louis Meckel, MD       And  . hydrOXYzine (ATARAX/VISTARIL) tablet  25 mg  25 mg Oral Q8H PRN Louis Meckel, MD   25 mg at 06/11/12 1019  . cloNIDine (CATAPRES) tablet 0.1 mg  0.1 mg Oral BID Louis Meckel, MD   0.1 mg at 06/11/12 2330  . darbepoetin (ARANESP) injection 100 mcg  100  mcg Intravenous Q Fri-HD Windy Kalata, MD      . diphenhydrAMINE (BENADRYL) injection 25 mg  25 mg Intravenous Q6H PRN Pelbreton C. Colon Flattery, MD   25 mg at 06/11/12 0905  . docusate sodium (COLACE) capsule 100 mg  100 mg Oral Daily PRN Lendon Colonel, NP   100 mg at 06/10/12 2152  . docusate sodium (ENEMEEZ) enema 283 mg  1 enema Rectal PRN Louis Meckel, MD      . doxercalciferol (HECTOROL) injection 3 mcg  3 mcg Intravenous Q M,W,F-HD Louis Meckel, MD   3 mcg at 06/11/12 1106  . feeding supplement (NEPRO CARB STEADY) liquid 237 mL  237 mL Oral TID PRN Louis Meckel, MD      . fluticasone (FLONASE) 50 MCG/ACT nasal spray 2 spray  2 spray Each Nare Daily Rogelia Mire, NP   2 spray at 06/11/12 1236  . folic acid (FOLVITE) tablet 1 mg  1 mg Oral Daily Rogelia Mire, NP   1 mg at 06/11/12 1234  . heparin ADULT infusion 100 units/mL (25000 units/250 mL)  1,200 Units/hr Intravenous Continuous Deboraha Sprang, MD 12 mL/hr at 06/11/12 1544 1,200 Units/hr at 06/11/12 1544  . labetalol (NORMODYNE) tablet 200 mg  200 mg Oral BID Rogelia Mire, NP   200 mg at 06/10/12 2051  . lanthanum (FOSRENOL) chewable tablet 1,000 mg  1,000 mg Oral TID WC Rogelia Mire, NP   1,000 mg at 06/11/12 1744  . levofloxacin (LEVAQUIN) tablet 250 mg  250 mg Oral Q48H Minus Breeding, MD   250 mg at 06/11/12 1235  . lisinopril (PRINIVIL,ZESTRIL) tablet 10 mg  10 mg Oral Daily Rogelia Mire, NP   10 mg at 06/11/12 1234  . loratadine (CLARITIN) tablet 10 mg  10 mg Oral Daily PRN Rogelia Mire, NP   10 mg at 06/08/12 1504  . metoprolol (LOPRESSOR) injection 5 mg  5 mg Intravenous Once Rogelia Mire, NP      . multivitamin (RENA-VIT)  tablet 1 tablet  1 tablet Oral Daily Windy Kalata, MD   1 tablet at 06/11/12 1544  . nitroGLYCERIN (NITROSTAT) SL tablet 0.4 mg  0.4 mg Sublingual Q5 Min x 3 PRN Rogelia Mire, NP      . nitroGLYCERIN 0.2 mg/mL in dextrose 5 % infusion  3-30 mcg/min Intravenous Titrated Rogelia Mire, NP 9 mL/hr at 06/11/12 2026 30 mcg/min at 06/11/12 2026  . ondansetron (ZOFRAN) injection 4 mg  4 mg Intravenous Q6H PRN Rogelia Mire, NP      . ondansetron [facility]) tablet 4 mg  4 mg Oral Q6H PRN Louis Meckel, MD       Or  . ondansetron (ZOFRAN) injection 4 mg  4 mg Intravenous Q6H PRN Louis Meckel, MD      . pantoprazole (PROTONIX) EC tablet 40 mg  40 mg Oral BID Rogelia Mire, NP   40 mg at 06/11/12 2153  . sorbitol 70 % solution 30 mL  30 mL Oral PRN Louis Meckel, MD      . traMADol Veatrice Bourbon) tablet 25 mg  25 mg Oral Q12H PRN Rogelia Mire, NP   25 mg at 06/10/12 0052  . zolpidem (AMBIEN) tablet 5 mg  5 mg Oral QHS PRN Louis Meckel, MD      . [DISCONTINUED] 0.9 %  sodium chloride infusion  100 mL Intravenous PRN Louis Meckel, MD      . [  DISCONTINUED] 0.9 %  sodium chloride infusion  100 mL Intravenous PRN Louis Meckel, MD      . [DISCONTINUED] alteplase (CATHFLO ACTIVASE) injection 2 mg  2 mg Intracatheter Once PRN Louis Meckel, MD      . [DISCONTINUED] darbepoetin (ARANESP) 100 MCG/0.5ML injection           . [DISCONTINUED] feeding supplement (NEPRO CARB STEADY) liquid 237 mL  237 mL Oral PRN Louis Meckel, MD      . [DISCONTINUED] heparin injection 1,000 Units  1,000 Units Dialysis PRN Louis Meckel, MD      . [DISCONTINUED] heparin injection 1,200 Units  20 Units/kg Dialysis PRN Louis Meckel, MD      . [DISCONTINUED] lidocaine (XYLOCAINE) 1 % injection 5 mL  5 mL Intradermal PRN Louis Meckel, MD      . [DISCONTINUED] lidocaine-prilocaine (EMLA) cream 1 application  1 application  Topical PRN Louis Meckel, MD      . [DISCONTINUED] pentafluoroprop-tetrafluoroeth (GEBAUERS) aerosol 1 application  1 application Topical PRN Louis Meckel, MD      . [DISCONTINUED] potassium chloride SA (K-DUR,KLOR-CON) 20 MEQ CR tablet              Imaging: No results found.  Significant Diagnostic Studies: CBC Lab Results  Component Value Date   WBC 8.1 06/12/2012   HGB 8.2* 06/12/2012   HCT 25.6* 06/12/2012   MCV 93.8 06/12/2012   PLT 190 06/12/2012    BMET    Component Value Date/Time   NA 134* 06/12/2012 0501   K 4.3 06/12/2012 0501   CL 95* 06/12/2012 0501   CO2 28 06/12/2012 0501   GLUCOSE 88 06/12/2012 0501   BUN 29* 06/12/2012 0501   CREATININE 6.58* 06/12/2012 0501   CALCIUM 9.0 06/12/2012 0501   GFRNONAA 6* 06/12/2012 0501   GFRAA 7* 06/12/2012 0501    COAG Lab Results  Component Value Date   INR 1.24 01/10/2012   No results found for this basename: PTT     Physical Examination BP Readings from Last 3 Encounters:  06/12/12 129/64  06/12/12 129/64  06/12/12 129/64   Temp Readings from Last 3 Encounters:  06/12/12 98.1 F (36.7 C) Oral  06/12/12 98.1 F (36.7 C) Oral  06/12/12 98.1 F (36.7 C) Oral   SpO2 Readings from Last 3 Encounters:  06/12/12 100%  06/12/12 100%  06/12/12 100%   Pulse Readings from Last 3 Encounters:  06/11/12 80  06/11/12 80  06/11/12 80    General:  WDWN in NAD Gait: Normal HENT: WNL Eyes: Pupils equal Pulmonary: normal non-labored breathing , without Rales, rhonchi,  wheezing Cardiac: RRR, without  Murmurs, rubs or gallops; No carotid bruits Abdomen: soft, NT, no masses Skin: no rashes, ulcers noted Vascular Exam/Pulses: Bilateral radial upper extremity pulses,  DP/PT bilateral palpable pulses.  Bilateral femoral pulses palpable.  Right groin ecchymosis with minimal edema.  Patient reports decreased edema today compared to yesterday.  Extremities without ischemic changes, no Gangrene , no cellulitis; no  open wounds;  Musculoskeletal: no muscle wasting or atrophy  Neurologic: A&O X 3; Appropriate Affect ;  SENSATION: normal; MOTOR FUNCTION: Pt has good and equal strength in all extremities - 5/5 Speech is fluent/normal  Non-Invasive Vascular Imaging: Right groin duplex  Positive for right groin pseudoaneurysm.     ASSESSMENT/PLAN:  Right groin duplex  Positive for right groin pseudoaneurysm.  I will discuss treatment plan with Dr. Oneida Alar.  COLLINS, EMMA MAUREEN  PA-C   History and exam details as above.  Pseudoaneurysm is partially thrombosed. 0.67 mm neck 1 cm chamber.  She has palpable femoral and DP pulse on that side with minimal right groin swelling.  This will probably spontaneously thrombose.  Would proceed with CABG as scheduled.  Needs follow up ultrasound prior to d/c home.  Ruta Hinds, MD [facility] [place] Office: [phone] Pager: [phone]

## 2012-06-12 NOTE — Progress Notes (Signed)
CVTS made aware about the result of platelet function assay which is 229 collagen epi and 229 collagen ADP. No order given.

## 2012-06-12 NOTE — Progress Notes (Signed)
1310-1450 Cardiac Rehab Noted that pt is on bedrest and not appro for ambulation . Discussed with pt and husband what to expect pre op. Gave them pre OHS booklet and encouraged them to watch preparing for heart surgery video. Gave pt IS and explained proper use. Discussed after surgery important of walking and using IS.Told them about sternal precautions. They voice understanding.

## 2012-06-12 NOTE — Progress Notes (Signed)
Back from the hemodialysis dept. Denied any discomfort.

## 2012-06-12 NOTE — Progress Notes (Signed)
Patient ID: Shawna [name], female   DOB: Dec 22, 1940, 71 y.o.   MRN: [id number]   SUBJECTIVE:  The patient is resting comfortably. She has not had any recurrent chest pain. She has been seen by the cardiac surgery team and it appears she will undergo bypass tomorrow. Two-dimensional echo was done yesterday. There is inferior hypokinesis. The ejection fraction is maintained in the range of 50-55%. There was mild mitral regurgitation.   Filed Vitals:   06/12/12 0345 06/12/12 0400 06/12/12 0500 06/12/12 0615  BP: 116/49   129/64  Pulse:      Temp:  98.1 F (36.7 C)    TempSrc:  Oral    Resp:      Height:      Weight:   141 lb 12.1 oz (64.3 kg)   SpO2:  100%  100%    Intake/Output Summary (Last 24 hours) at 06/12/12 0757 Last data filed at 06/12/12 0600  Gross per 24 hour  Intake   1119 ml  Output   1957 ml  Net   -838 ml    LABS: Basic Metabolic Panel:  Basename 06/11/12 0728  NA 131*  K 4.6  CL 91*  CO2 22  GLUCOSE 98  BUN 59*  CREATININE 10.84*  CALCIUM 9.0  MG --  PHOS 4.8*   Liver Function Tests:  Basename 06/11/12 0728  AST --  ALT --  ALKPHOS --  BILITOT --  PROT --  ALBUMIN 2.9*   No results found for this basename: LIPASE:2,AMYLASE:2 in the last 72 hours CBC:  Basename 06/12/12 0501 06/11/12 0500  WBC 8.1 8.4  NEUTROABS -- --  HGB 8.2* 8.7*  HCT 25.6* 26.0*  MCV 93.8 93.5  PLT 190 220   Cardiac Enzymes: No results found for this basename: CKTOTAL:3,CKMB:3,CKMBINDEX:3,TROPONINI:3 in the last 72 hours BNP: No components found with this basename: POCBNP:3 D-Dimer: No results found for this basename: DDIMER:2 in the last 72 hours Hemoglobin A1C: No results found for this basename: HGBA1C in the last 72 hours Fasting Lipid Panel: No results found for this basename: CHOL,HDL,LDLCALC,TRIG,CHOLHDL,LDLDIRECT in the last 72 hours Thyroid Function Tests: No results found for this basename: TSH,T4TOTAL,FREET3,T3FREE,THYROIDAB in the last 72  hours  RADIOLOGY: Dg Chest 2 View  06/09/2012  *RADIOLOGY REPORT*  Clinical Data: Abnormal chest x-ray follow-up. Rule out pneumonia.  CHEST - 2 VIEW  Comparison: One-view chest 06/07/2012.  Findings: The heart is mildly enlarged.  There is no edema or effusion to suggest failure. Right lower lobe pneumonia is suspected with new right lower lobe airspace consolidation since the exam of 05/30/2012.  The visualized soft tissues and bony thorax are unremarkable.  IMPRESSION:  1.  Right lower lobe pneumonia. 2.  Mild cardiomegaly without failure.   Original Report Authenticated By: San Morelle, M.D.     PHYSICAL EXAM Patient is oriented to person time and place. Affect is normal. Her husband in the room. There is no jugulovenous distention. Lungs reveal a few scattered rhonchi. Cardiac exam reveals S1 and S2. There no clicks or significant murmurs. The abdomen is soft. There is no peripheral edema.   TELEMETRY: I have reviewed telemetry today June 12, 2012. There is normal sinus rhythm.   ASSESSMENT AND PLAN:   ESRD (end stage renal disease)   Nephrology is following the patient carefully. She is to be dialyzed again today in preparation for her bypass tomorrow.   Non-STEMI (non-ST elevated myocardial infarction)     Plans are now made for the patient's  bypass surgery.   Pneumonia    This does not appear to be a significant clinical problem.   Acute on chronic diastolic CHF (congestive heart failure), NYHA class 1     Her volume status is stable. Volume is controlled primarily to her dialysis.   UTI (urinary tract infection)    She is on an antibiotic.   Mitral regurgitation    Echo yesterday showed that her mitral regurgitation is only mild.  Plans will continue for probable bypass surgery tomorrow. The patient is looking forward to going ahead. She has been bothered a great deal by the chest pain she's been having at home.   Dola Argyle 06/12/2012 7:57 AM

## 2012-06-12 NOTE — Progress Notes (Signed)
Patient ID: Shawna [name], female   DOB: 04-Feb-1941, 71 y.o.   MRN: [id number]                   [address]            [place] [address]          [phone]     4 Days Post-Op Procedure(s) (LRB): LEFT HEART CATHETERIZATION WITH CORONARY ANGIOGRAM (N/A)  LOS: 4 days   Subjective: In dialysis now, for CABG in am  Objective: Vital signs in last 24 hours: Patient Vitals for the past 24 hrs:  BP Temp Temp src Pulse Resp SpO2 Weight  06/12/12 1530 153/73 mmHg - - 75  15  97 % -  06/12/12 1500 143/63 mmHg - - 75  21  - -  06/12/12 1430 163/75 mmHg - - 76  22  - -  06/12/12 1403 152/69 mmHg - - 74  19  - -  06/12/12 1357 148/70 mmHg - - 76  14  - -  06/12/12 1349 155/68 mmHg 98.4 F (36.9 C) Oral 78  16  95 % 142 lb 3.2 oz (64.5 kg)  06/12/12 1132 120/58 mmHg 98.2 F (36.8 C) Oral 76  18  95 % -  06/12/12 0927 - 98.7 F (37.1 C) Oral - - - -  06/12/12 0907 - - - 78  18  98 % -  06/12/12 0615 129/64 mmHg - - - - 100 % -  06/12/12 0500 - - - - - - 141 lb 12.1 oz (64.3 kg)  06/12/12 0400 - 98.1 F (36.7 C) Oral - - 100 % -  06/12/12 0345 116/49 mmHg - - - - - -  06/12/12 0245 121/57 mmHg - - - - - -  06/12/12 0200 - - - - - 94 % -  06/12/12 0030 126/64 mmHg - - - - - -  06/11/12 2330 137/65 mmHg 98.6 F (37 C) Oral - - - -  06/11/12 2300 - - - - - 93 % -  06/11/12 2200 - - - - - 94 % -  06/11/12 2150 134/56 mmHg - - - - - -  06/11/12 2100 - - - - - 100 % -  06/11/12 2020 140/55 mmHg 98.6 F (37 C) Oral - - 100 % -  06/11/12 2000 - - - - - 100 % -  06/11/12 1900 - - - 80  - 97 % -    Filed Weights   06/11/12 1129 06/12/12 0500 06/12/12 1349  Weight: 138 lb 7.2 oz (62.8 kg) 141 lb 12.1 oz (64.3 kg) 142 lb 3.2 oz (64.5 kg)    Hemodynamic parameters for last 24 hours:    Intake/Output from previous day: 12/02 0701 - 12/03 0700 In: 1140 [P.O.:720; I.V.:420] Out: 1959 [Urine:101; Stool:5] Intake/Output this shift: Total I/O In: 476 [P.O.:350;  I.V.:126] Out: -   Scheduled Meds:   . [COMPLETED] albuterol  2.5 mg Nebulization Once  . aminocaproic acid (AMICAR) for OHS   Intravenous To OR  . amLODipine  5 mg Oral Daily  . aspirin EC  81 mg Oral Daily  . atorvastatin  10 mg Oral q1800  . bisacodyl  5 mg Oral Once  . cefUROXime (ZINACEF)  IV  1.5 g Intravenous To OR  . cefUROXime (ZINACEF)  IV  750 mg Intravenous To OR  . chlorhexidine  60 mL Topical Once  . cloNIDine  0.1 mg Oral  BID  . darbepoetin (ARANESP) injection - DIALYSIS  100 mcg Intravenous Q Fri-HD  . dexmedetomidine  0.1-0.7 mcg/kg/hr Intravenous To OR  . DOPamine  2-20 mcg/kg/min Intravenous To OR  . doxercalciferol  3 mcg Intravenous Q M,W,F-HD  . epinephrine  0.5-20 mcg/min Intravenous To OR  . fluticasone  2 spray Each Nare Daily  . folic acid  1 mg Oral Daily  . heparin-papaverine-plasmalyte irrigation   Irrigation To OR  . insulin (NOVOLIN-R) infusion   Intravenous To OR  . labetalol  200 mg Oral BID  . lanthanum  1,000 mg Oral TID WC  . levofloxacin  250 mg Oral Q48H  . lisinopril  10 mg Oral Daily  . magnesium sulfate  40 mEq Other To OR  . metoprolol  5 mg Intravenous Once  . metoprolol tartrate  12.5 mg Oral Once  . multivitamin  1 tablet Oral Daily  . nitroGLYCERIN  2-200 mcg/min Intravenous To OR  . pantoprazole  40 mg Oral BID  . phenylephrine (NEO-SYNEPHRINE) Adult infusion  30-200 mcg/min Intravenous To OR  . potassium chloride  80 mEq Other To OR  . vancomycin  1,250 mg Intravenous To OR   Continuous Infusions:   . heparin 1,200 Units/hr (06/12/12 1051)  . nitroGLYCERIN 30 mcg/min (06/11/12 2026)   PRN Meds:.acetaminophen, acetaminophen, ALPRAZolam, calcium carbonate (dosed in mg elemental calcium), camphor-menthol, diphenhydrAMINE, docusate sodium, docusate sodium, feeding supplement (NEPRO CARB STEADY), hydrOXYzine, loratadine, nitroGLYCERIN, ondansetron (ZOFRAN) IV, ondansetron (ZOFRAN) IV, ondansetron, sorbitol, temazepam, traMADol,  zolpidem  General appearance: alert, cooperative and no distress Neurologic: intact Heart: regular rate and rhythm, S1, S2 normal, no murmur, click, rub or gallop and normal apical impulse Lungs: clear to auscultation bilaterally and normal percussion bilaterally Abdomen: soft, non-tender; bowel sounds normal; no masses,  no organomegaly Extremities: rt groin less tender Wound:   Lab Results: CBC: Basename 06/12/12 0501 06/11/12 0500  WBC 8.1 8.4  HGB 8.2* 8.7*  HCT 25.6* 26.0*  PLT 190 220   BMET:  Basename 06/12/12 0501 06/11/12 0728  NA 134* 131*  K 4.3 4.6  CL 95* 91*  CO2 28 22  GLUCOSE 88 98  BUN 29* 59*  CREATININE 6.58* 10.84*  CALCIUM 9.0 9.0    PT/INR: No results found for this basename: LABPROT,INR in the last 72 hours   Radiology Dg Chest 2 View  06/09/2012  *RADIOLOGY REPORT*  Clinical Data: Abnormal chest x-ray follow-up. Rule out pneumonia.  CHEST - 2 VIEW  Comparison: One-view chest 06/07/2012.  Findings: The heart is mildly enlarged.  There is no edema or effusion to suggest failure. Right lower lobe pneumonia is suspected with new right lower lobe airspace consolidation since the exam of 05/30/2012.  The visualized soft tissues and bony thorax are unremarkable.  IMPRESSION:  1.  Right lower lobe pneumonia. 2.  Mild cardiomegaly without failure.   Original Report Authenticated By: San Morelle, M.D.    Assessment/Plan: S/P Procedure(s) (LRB): LEFT HEART CATHETERIZATION WITH CORONARY ANGIOGRAM (N/A) Plan CABG in am  The goals risks and alternatives of the planned surgical procedure CABG  have been discussed with the patient in detail. The risks of the procedure including death, infection, stroke, myocardial infarction, bleeding, blood transfusion have all been discussed specifically.  I have quoted Shawna [name] a 5 % of perioperative mortality and a complication rate as high as 30 %. The patient's questions have been answered.Shawna [name] is willing   to proceed with the planned procedure.   Percell Miller  Maryruth Bun MD 06/12/2012 4:05 PM

## 2012-06-12 NOTE — Progress Notes (Signed)
PFT done. Unconfirmed copy placed in progress notes sections of shadow chart.  Philipp Deputy RRT, RCP 06/12/2012 8:54 AM

## 2012-06-12 NOTE — Progress Notes (Addendum)
Pre-op Cardiac Surgery  Carotid Findings:  Right= <40% ICA stenosis. Left= 40-59% ICA stenosis, upper end of scale (velocity 190/55). Bilateral antegrade vertebral flow.   Upper Extremity Right Left  Brachial Pressures 153-Triphasic Unable to obtain due to HD  Radial Waveforms Triphasic Unable to obtain due to HD  Ulnar Waveforms Biphasic Unable to obtain due to HD  Palmar Arch (Allen's Test) Within normal limits Unable to obtain due to HD    Lower  Extremity Right Left  Dorsalis Pedis    Anterior Tibial    Posterior Tibial    Ankle/Brachial Indices     Findings:  Bilateral palpable pedal pulses.  Maudry Mayhew, RDMS, RDCS  Landry Mellow, RDMS, RVT 06/12/2012

## 2012-06-12 NOTE — Progress Notes (Signed)
S:no CP NO SOB O:BP 129/64  Pulse 80  Temp 98.1 F (36.7 C) (Oral)  Resp 15  Ht 5\' 1"  (1.549 m)  Wt 64.3 kg (141 lb 12.1 oz)  BMI 26.78 kg/m2  SpO2 100%  Intake/Output Summary (Last 24 hours) at 06/12/12 0741 Last data filed at 06/12/12 0600  Gross per 24 hour  Intake   1119 ml  Output   1957 ml  Net   -838 ml   Weight change: -0.4 kg (-14.1 oz) [id number] and alert CVS:RRR Resp:clear Abd:+BS NTND Ext:no edema + bruit Lt UA AVF NEURO:CNI Ox3      . amLODipine  5 mg Oral Daily  . aspirin EC  81 mg Oral Daily  . atorvastatin  10 mg Oral q1800  . cloNIDine  0.1 mg Oral BID  . darbepoetin (ARANESP) injection - DIALYSIS  100 mcg Intravenous Q Fri-HD  . doxercalciferol  3 mcg Intravenous Q M,W,F-HD  . fluticasone  2 spray Each Nare Daily  . folic acid  1 mg Oral Daily  . labetalol  200 mg Oral BID  . lanthanum  1,000 mg Oral TID WC  . levofloxacin  250 mg Oral Q48H  . lisinopril  10 mg Oral Daily  . metoprolol  5 mg Intravenous Once  . multivitamin  1 tablet Oral Daily  . pantoprazole  40 mg Oral BID   No results found. BMET    Component Value Date/Time   NA 131* 06/11/2012 0728   K 4.6 06/11/2012 0728   CL 91* 06/11/2012 0728   CO2 22 06/11/2012 0728   GLUCOSE 98 06/11/2012 0728   BUN 59* 06/11/2012 0728   CREATININE 10.84* 06/11/2012 0728   CALCIUM 9.0 06/11/2012 0728   GFRNONAA 3* 06/11/2012 0728   GFRAA 4* 06/11/2012 0728   CBC    Component Value Date/Time   WBC 8.4 06/11/2012 0500   RBC 2.78* 06/11/2012 0500   HGB 8.7* 06/11/2012 0500   HCT 26.0* 06/11/2012 0500   PLT 220 06/11/2012 0500   MCV 93.5 06/11/2012 0500   MCH 31.3 06/11/2012 0500   MCHC 33.5 06/11/2012 0500   RDW 22.9* 06/11/2012 0500   LYMPHSABS 1.0 01/17/2012 0528   MONOABS 1.3* 01/17/2012 0528   EOSABS 0.5 01/17/2012 0528   BASOSABS 0.1 01/17/2012 0528     Assessment: 1. CAD, plan for CABG in AM 2. Anemia, on aranesp 3. SecHPTH, on hectoral 4. ESRD Plan: 1.  Will plan HD today in anticipation of  CABG in AM  Shawna [name] T

## 2012-06-12 NOTE — Progress Notes (Signed)
ANTICOAGULATION CONSULT NOTE - Follow Up Consult  Pharmacy Consult for heparin Indication: 3 vessel CAD  Allergies  Allergen Reactions  . Aspirin Other (See Comments)    Mess up her stomach; "makes my bowels have blood in them". Takes 81 mg EC Aspirin   . Contrast Media (Iodinated Diagnostic Agents) Itching  . Iron Itching    "they gave me iron in dialysis; had to give me Benadryl cause I had to have the iron" (05/02/2012)  . Macrodantin (Nitrofurantoin Macrocrystal) Other (See Comments)    "broke me out in big old knots all over my body; had to go to ER"  . Penicillins Other (See Comments)    "makes me real weak when I take it; like I'll pass out"  . Plavix (Clopidogrel Bisulfate) Rash  . Bactrim (Sulfamethoxazole W-Trimethoprim) Rash  . Sulfa Antibiotics Rash  . Venofer (Ferric Oxide) Itching    Patient reports using Benadryl prior to doses as [place]    Patient Measurements: Height: 5\' 1"  (154.9 cm) Weight: 141 lb 12.1 oz (64.3 kg) IBW/kg (Calculated) : 47.8    Vital Signs: Temp: 98.7 F (37.1 C) (12/03 0927) Temp src: Oral (12/03 0927) BP: 129/64 mmHg (12/03 0615) Pulse Rate: 78  (12/03 0907)  Labs:  Basename 06/12/12 0501 06/11/12 0728 06/11/12 0500 06/10/12 2152 06/10/12 0505  HGB 8.2* -- 8.7* -- --  HCT 25.6* -- 26.0* -- 27.3*  PLT 190 -- 220 -- 234  APTT -- -- -- -- --  LABPROT -- -- -- -- --  INR -- -- -- -- --  HEPARINUNFRC 0.60 -- 0.35 0.43 --  CREATININE 6.58* 10.84* -- -- --  CKTOTAL -- -- -- -- --  CKMB -- -- -- -- --  TROPONINI -- -- -- -- --    Estimated Creatinine Clearance: 6.6 ml/min (by C-G formula based on Cr of 6.58).  Assessment: Patient is a 71 y.o F on heparin for CAD with plan for CABG on 12/03.  No bleeding noted.  Heparin level is at goal.  Goal of Therapy:  Heparin level 0.3-0.7 units/ml Monitor platelets by anticoagulation protocol: Yes   Plan:  1) no change for heparin  Kanita Delage P 06/12/2012,10:43 AM

## 2012-06-13 ENCOUNTER — Inpatient Hospital Stay (HOSPITAL_COMMUNITY): Payer: Medicare Other | Admitting: Anesthesiology

## 2012-06-13 ENCOUNTER — Encounter (HOSPITAL_COMMUNITY)
Admission: AD | Disposition: A | Discharge status: Skilled Nursing Facility | Payer: Self-pay | Source: Other Acute Inpatient Hospital | Attending: Cardiothoracic Surgery

## 2012-06-13 ENCOUNTER — Inpatient Hospital Stay (HOSPITAL_COMMUNITY): Payer: Medicare Other

## 2012-06-13 ENCOUNTER — Encounter (HOSPITAL_COMMUNITY): Payer: Self-pay | Admitting: Anesthesiology

## 2012-06-13 DIAGNOSIS — I2510 Atherosclerotic heart disease of native coronary artery without angina pectoris: Secondary | ICD-10-CM

## 2012-06-13 HISTORY — PX: INTRAOPERATIVE TRANSESOPHAGEAL ECHOCARDIOGRAM: SHX5062

## 2012-06-13 HISTORY — PX: CORONARY ARTERY BYPASS GRAFT: SHX141

## 2012-06-13 LAB — POCT I-STAT, CHEM 8
Calcium, Ion: 1.12 mmol/L — ABNORMAL LOW (ref 1.13–1.30)
Creatinine, Ser: 4.7 mg/dL — ABNORMAL HIGH (ref 0.50–1.10)
Glucose, Bld: 161 mg/dL — ABNORMAL HIGH (ref 70–99)
Hemoglobin: 15 g/dL (ref 12.0–15.0)
Sodium: 131 mEq/L — ABNORMAL LOW (ref 135–145)
TCO2: 20 mmol/L (ref 0–100)

## 2012-06-13 LAB — CBC
HCT: 43.7 % (ref 36.0–46.0)
HCT: 42.8 % (ref 36.0–46.0)
Hemoglobin: 9.2 g/dL — ABNORMAL LOW (ref 12.0–15.0)
Hemoglobin: 14.8 g/dL (ref 12.0–15.0)
Hemoglobin: 14.5 g/dL (ref 12.0–15.0)
MCH: 30.3 pg (ref 26.0–34.0)
MCHC: 32.1 g/dL (ref 30.0–36.0)
MCHC: 33.9 g/dL (ref 30.0–36.0)
MCHC: 33.9 g/dL (ref 30.0–36.0)
MCV: 89.4 fL (ref 78.0–100.0)
Platelets: 146 10*3/uL — ABNORMAL LOW (ref 150–400)
RBC: 2.98 MIL/uL — ABNORMAL LOW (ref 3.87–5.11)
RBC: 4.91 MIL/uL (ref 3.87–5.11)
RBC: 4.79 MIL/uL (ref 3.87–5.11)
RDW: 22.5 % — ABNORMAL HIGH (ref 11.5–15.5)
WBC: 8.3 10*3/uL (ref 4.0–10.5)
WBC: 20.9 10*3/uL — ABNORMAL HIGH (ref 4.0–10.5)
WBC: 19.4 10*3/uL — ABNORMAL HIGH (ref 4.0–10.5)

## 2012-06-13 LAB — POCT I-STAT 3, VENOUS BLOOD GAS (G3P V)
Bicarbonate: 23.7 mEq/L (ref 20.0–24.0)
O2 Saturation: 76 %
Patient temperature: 33.5
TCO2: 25 mmol/L (ref 0–100)
pCO2, Ven: 38.8 mmHg — ABNORMAL LOW (ref 45.0–50.0)

## 2012-06-13 LAB — POCT I-STAT 4, (NA,K, GLUC, HGB,HCT)
Glucose, Bld: 106 mg/dL — ABNORMAL HIGH (ref 70–99)
Glucose, Bld: 89 mg/dL (ref 70–99)
Glucose, Bld: 157 mg/dL — ABNORMAL HIGH (ref 70–99)
HCT: 25 % — ABNORMAL LOW (ref 36.0–46.0)
HCT: 24 % — ABNORMAL LOW (ref 36.0–46.0)
HCT: 23 % — ABNORMAL LOW (ref 36.0–46.0)
HCT: 23 % — ABNORMAL LOW (ref 36.0–46.0)
Hemoglobin: 8.5 g/dL — ABNORMAL LOW (ref 12.0–15.0)
Hemoglobin: 8.2 g/dL — ABNORMAL LOW (ref 12.0–15.0)
Hemoglobin: 7.8 g/dL — ABNORMAL LOW (ref 12.0–15.0)
Potassium: 4 mEq/L (ref 3.5–5.1)
Potassium: 6 mEq/L — ABNORMAL HIGH (ref 3.5–5.1)
Potassium: 5.5 mEq/L — ABNORMAL HIGH (ref 3.5–5.1)
Sodium: 137 mEq/L (ref 135–145)
Sodium: 132 mEq/L — ABNORMAL LOW (ref 135–145)
Sodium: 128 mEq/L — ABNORMAL LOW (ref 135–145)

## 2012-06-13 LAB — POCT I-STAT 3, ART BLOOD GAS (G3+)
Acid-Base Excess: 2 mmol/L (ref 0.0–2.0)
Bicarbonate: 25.3 mEq/L — ABNORMAL HIGH (ref 20.0–24.0)
Bicarbonate: 26.2 mEq/L — ABNORMAL HIGH (ref 20.0–24.0)
O2 Saturation: 100 %
O2 Saturation: 100 %
O2 Saturation: 97 %
Patient temperature: 33.5
TCO2: 27 mmol/L (ref 0–100)
pCO2 arterial: 40.3 mmHg (ref 35.0–45.0)
pCO2 arterial: 36.7 mmHg (ref 35.0–45.0)
pH, Arterial: 7.432 (ref 7.350–7.450)
pO2, Arterial: 286 mmHg — ABNORMAL HIGH (ref 80.0–100.0)

## 2012-06-13 LAB — URINE MICROSCOPIC-ADD ON

## 2012-06-13 LAB — BASIC METABOLIC PANEL
BUN: 17 mg/dL (ref 6–23)
CO2: 28 mEq/L (ref 19–32)
Calcium: 9.4 mg/dL (ref 8.4–10.5)
Chloride: 97 mEq/L (ref 96–112)
Creatinine, Ser: 4.65 mg/dL — ABNORMAL HIGH (ref 0.50–1.10)
GFR calc Af Amer: 10 mL/min — ABNORMAL LOW (ref >90)
GFR calc non Af Amer: 9 mL/min — ABNORMAL LOW (ref >90)
Glucose, Bld: 114 mg/dL — ABNORMAL HIGH (ref 70–99)
Potassium: 4.1 mEq/L (ref 3.5–5.1)
Sodium: 136 mEq/L (ref 135–145)

## 2012-06-13 LAB — URINALYSIS, ROUTINE W REFLEX MICROSCOPIC
Bilirubin Urine: NEGATIVE
Glucose, UA: NEGATIVE mg/dL
Ketones, ur: NEGATIVE mg/dL
Leukocytes, UA: NEGATIVE
Nitrite: NEGATIVE
Protein, ur: 100 mg/dL — AB
Specific Gravity, Urine: 1.007 (ref 1.005–1.030)
Urobilinogen, UA: 0.2 mg/dL (ref 0.0–1.0)
pH: 8 (ref 5.0–8.0)

## 2012-06-13 LAB — GLUCOSE, CAPILLARY
Glucose-Capillary: 141 mg/dL — ABNORMAL HIGH (ref 70–99)
Glucose-Capillary: 111 mg/dL — ABNORMAL HIGH (ref 70–99)
Glucose-Capillary: 114 mg/dL — ABNORMAL HIGH (ref 70–99)
Glucose-Capillary: 91 mg/dL (ref 70–99)

## 2012-06-13 LAB — CREATININE, SERUM
Creatinine, Ser: 4.85 mg/dL — ABNORMAL HIGH (ref 0.50–1.10)
GFR calc Af Amer: 9 mL/min — ABNORMAL LOW (ref >90)
GFR calc non Af Amer: 8 mL/min — ABNORMAL LOW (ref >90)

## 2012-06-13 LAB — PLATELET COUNT: Platelets: 113 10*3/uL — ABNORMAL LOW (ref 150–400)

## 2012-06-13 LAB — PROTIME-INR
INR: 1.34 (ref 0.00–1.49)
Prothrombin Time: 16.3 seconds — ABNORMAL HIGH (ref 11.6–15.2)

## 2012-06-13 LAB — APTT: aPTT: 36 seconds (ref 24–37)

## 2012-06-13 LAB — HEMOGLOBIN A1C
Hgb A1c MFr Bld: 4.8 % (ref <5.7)
Mean Plasma Glucose: 91 mg/dL (ref <117)

## 2012-06-13 LAB — MAGNESIUM: Magnesium: 1.9 mg/dL (ref 1.5–2.5)

## 2012-06-13 LAB — HEPARIN LEVEL (UNFRACTIONATED): Heparin Unfractionated: 0.28 IU/mL — ABNORMAL LOW (ref 0.30–0.70)

## 2012-06-13 LAB — HEMOGLOBIN AND HEMATOCRIT, BLOOD
HCT: 23.5 % — ABNORMAL LOW (ref 36.0–46.0)
Hemoglobin: 7.5 g/dL — ABNORMAL LOW (ref 12.0–15.0)

## 2012-06-13 IMAGING — CR DG CHEST 1V PORT
1 series · 1 of 1 positions shown · non-contrast
Comparison: [DATE]

CLINICAL DATA: Status post CABG.

PORTABLE CHEST - 1 VIEW

[AP]
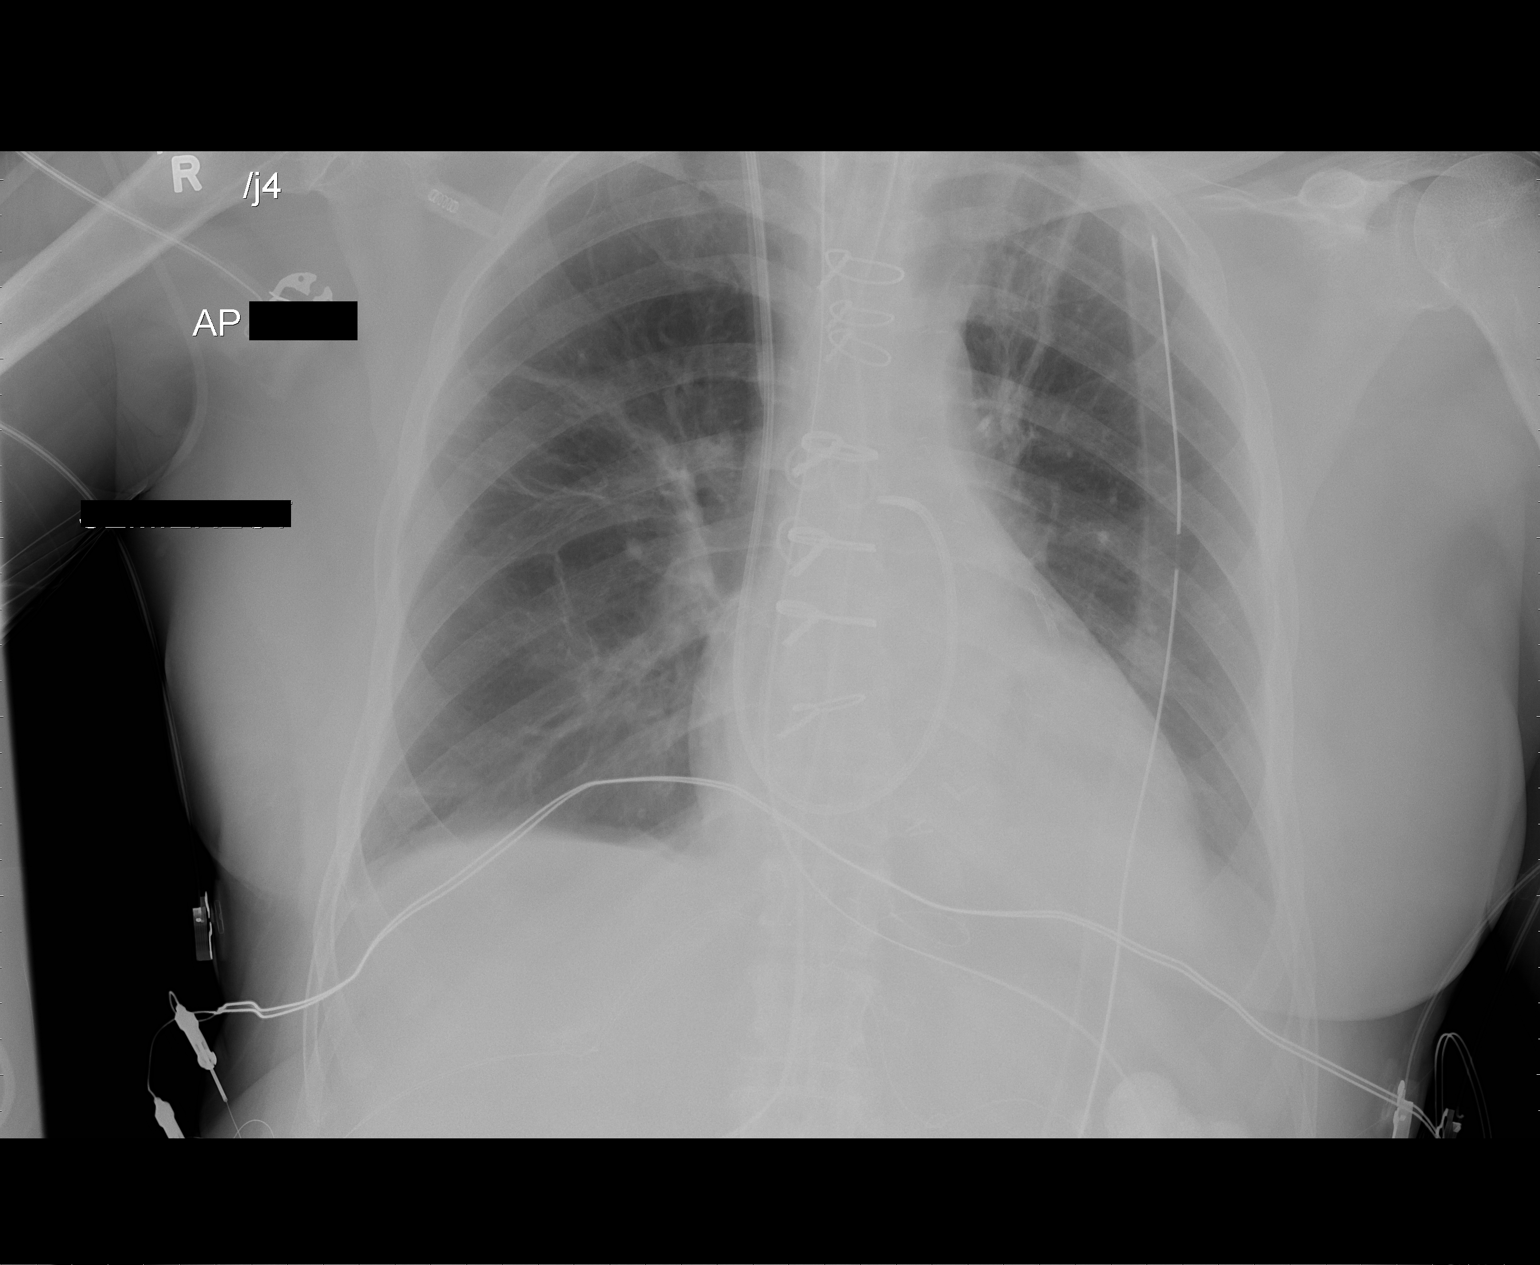

[1 of 1 positions shown; findings below may reference images not displayed]

FINDINGS: Endotracheal tube tip lies approximately 3 cm above the
carina.  Swan-Ganz catheter tip is in the proximal right pulmonary
artery.  A left chest tube is present without pneumothorax.  Left
lower lobe atelectasis present as well as scattered subsegmental
atelectasis in both lungs.  No evidence of overt pulmonary edema or
significant pleural effusions.  The heart size and mediastinal
contours are within normal limits.
IMPRESSION: Scattered atelectasis bilaterally.  No pneumothorax identified.

## 2012-06-13 SURGERY — CORONARY ARTERY BYPASS GRAFTING (CABG)
Anesthesia: General | Site: Chest | Wound class: Clean

## 2012-06-13 MED ORDER — INSULIN ASPART 100 UNIT/ML ~~LOC~~ SOLN: 0.0000 [IU] | SUBCUTANEOUS | Status: DC

## 2012-06-13 MED ORDER — SODIUM CHLORIDE 0.9 % IV SOLN: INTRAVENOUS | Status: DC

## 2012-06-13 MED ORDER — SODIUM CHLORIDE 0.9 % IV SOLN
INTRAVENOUS | Status: DC | PRN
  Administered 2012-06-13: 09:00:00 via INTRAVENOUS

## 2012-06-13 MED ORDER — 0.9 % SODIUM CHLORIDE (POUR BTL) OPTIME
TOPICAL | Status: DC | PRN
  Administered 2012-06-13: 6000 mL

## 2012-06-13 MED ORDER — MILRINONE IN DEXTROSE 20 MG/100ML IV SOLN: 0.1250 ug/kg/min | INTRAVENOUS | Status: DC

## 2012-06-13 MED ORDER — INSULIN REGULAR BOLUS VIA INFUSION
0.0000 [IU] | Freq: Three times a day (TID) | INTRAVENOUS | Status: DC
  Filled 2012-06-13: qty 10

## 2012-06-13 MED ORDER — MILRINONE IN DEXTROSE 20 MG/100ML IV SOLN
INTRAVENOUS | Status: DC | PRN
  Administered 2012-06-13: 0.125 ug/kg/min via INTRAVENOUS

## 2012-06-13 MED ORDER — ARTIFICIAL TEARS OP OINT
TOPICAL_OINTMENT | OPHTHALMIC | Status: DC | PRN
  Administered 2012-06-13: 1 via OPHTHALMIC

## 2012-06-13 MED ORDER — LEVOFLOXACIN IN D5W 500 MG/100ML IV SOLN
500.0000 mg | INTRAVENOUS | Status: AC
  Administered 2012-06-13: 500 mg via INTRAVENOUS
  Filled 2012-06-13: qty 100

## 2012-06-13 MED ORDER — INSULIN ASPART 100 UNIT/ML ~~LOC~~ SOLN
0.0000 [IU] | SUBCUTANEOUS | Status: DC
  Administered 2012-06-14 (×2): 2 [IU] via SUBCUTANEOUS

## 2012-06-13 MED ORDER — ACETAMINOPHEN 160 MG/5ML PO SOLN
975.0000 mg | Freq: Four times a day (QID) | ORAL | Status: DC
  Administered 2012-06-14 – 2012-06-15 (×3): 975 mg
  Filled 2012-06-13 (×3): qty 40.6

## 2012-06-13 MED ORDER — MIDAZOLAM HCL 5 MG/5ML IJ SOLN
INTRAMUSCULAR | Status: DC | PRN
  Administered 2012-06-13 (×3): 2 mg via INTRAVENOUS
  Administered 2012-06-13: 1 mg via INTRAVENOUS
  Administered 2012-06-13 (×2): 2 mg via INTRAVENOUS

## 2012-06-13 MED ORDER — HEMOSTATIC AGENTS (NO CHARGE) OPTIME
TOPICAL | Status: DC | PRN
  Administered 2012-06-13: 1 via TOPICAL

## 2012-06-13 MED ORDER — MORPHINE SULFATE 2 MG/ML IJ SOLN
1.0000 mg | INTRAMUSCULAR | Status: AC | PRN
  Administered 2012-06-13: 1 mg via INTRAVENOUS
  Administered 2012-06-13: 2 mg via INTRAVENOUS
  Administered 2012-06-13: 1 mg via INTRAVENOUS

## 2012-06-13 MED ORDER — METOPROLOL TARTRATE 1 MG/ML IV SOLN
2.5000 mg | INTRAVENOUS | Status: DC | PRN
  Administered 2012-06-14 (×2): 5 mg via INTRAVENOUS

## 2012-06-13 MED ORDER — POTASSIUM CHLORIDE 10 MEQ/50ML IV SOLN: 10.0000 meq | INTRAVENOUS | Status: AC

## 2012-06-13 MED ORDER — ROCURONIUM BROMIDE 100 MG/10ML IV SOLN
INTRAVENOUS | Status: DC | PRN
  Administered 2012-06-13: 50 mg via INTRAVENOUS

## 2012-06-13 MED ORDER — ONDANSETRON HCL 4 MG/2ML IJ SOLN
4.0000 mg | Freq: Four times a day (QID) | INTRAMUSCULAR | Status: DC | PRN
  Administered 2012-06-15: 4 mg via INTRAVENOUS
  Filled 2012-06-13: qty 2

## 2012-06-13 MED ORDER — DOPAMINE-DEXTROSE 3.2-5 MG/ML-% IV SOLN: 0.0000 ug/kg/min | INTRAVENOUS | Status: DC

## 2012-06-13 MED ORDER — SODIUM CHLORIDE 0.9 % IJ SOLN
3.0000 mL | Freq: Two times a day (BID) | INTRAMUSCULAR | Status: DC
  Administered 2012-06-14 – 2012-06-17 (×4): 3 mL via INTRAVENOUS

## 2012-06-13 MED ORDER — SODIUM CHLORIDE 0.9 % IJ SOLN
3.0000 mL | INTRAMUSCULAR | Status: DC | PRN
  Administered 2012-06-13: 3 mL via INTRAVENOUS
  Administered 2012-06-13: 10 mL via INTRAVENOUS

## 2012-06-13 MED ORDER — MILRINONE IN DEXTROSE 20 MG/100ML IV SOLN
0.1250 ug/kg/min | INTRAVENOUS | Status: DC
  Filled 2012-06-13: qty 100

## 2012-06-13 MED ORDER — LACTATED RINGERS IV SOLN: 500.0000 mL | Freq: Once | INTRAVENOUS | Status: AC | PRN

## 2012-06-13 MED ORDER — METOPROLOL TARTRATE 12.5 MG HALF TABLET
12.5000 mg | ORAL_TABLET | Freq: Two times a day (BID) | ORAL | Status: DC
  Administered 2012-06-14 – 2012-06-16 (×5): 12.5 mg via ORAL
  Filled 2012-06-13 (×9): qty 1

## 2012-06-13 MED ORDER — LIDOCAINE HCL (CARDIAC) 20 MG/ML IV SOLN
INTRAVENOUS | Status: DC | PRN
  Administered 2012-06-13: 50 mg via INTRAVENOUS

## 2012-06-13 MED ORDER — PROPOFOL 10 MG/ML IV BOLUS
INTRAVENOUS | Status: DC | PRN
  Administered 2012-06-13: 20 mg via INTRAVENOUS

## 2012-06-13 MED ORDER — PANTOPRAZOLE SODIUM 40 MG PO TBEC
40.0000 mg | DELAYED_RELEASE_TABLET | Freq: Every day | ORAL | Status: DC
  Administered 2012-06-15 – 2012-06-16 (×2): 40 mg via ORAL
  Filled 2012-06-13 (×2): qty 1

## 2012-06-13 MED ORDER — HEPARIN SODIUM (PORCINE) 1000 UNIT/ML IJ SOLN
INTRAMUSCULAR | Status: DC | PRN
  Administered 2012-06-13: 27000 [IU] via INTRAVENOUS

## 2012-06-13 MED ORDER — PHENYLEPHRINE HCL 10 MG/ML IJ SOLN
0.0000 ug/min | INTRAVENOUS | Status: DC
  Filled 2012-06-13: qty 2

## 2012-06-13 MED ORDER — ACETAMINOPHEN 500 MG PO TABS
1000.0000 mg | ORAL_TABLET | Freq: Four times a day (QID) | ORAL | Status: DC
  Administered 2012-06-14 – 2012-06-17 (×7): 1000 mg via ORAL
  Filled 2012-06-13 (×16): qty 2

## 2012-06-13 MED ORDER — SODIUM CHLORIDE 0.9 % IV SOLN
INTRAVENOUS | Status: DC | PRN
  Administered 2012-06-13: 08:00:00 via INTRAVENOUS

## 2012-06-13 MED ORDER — BISACODYL 10 MG RE SUPP: 10.0000 mg | Freq: Every day | RECTAL | Status: DC

## 2012-06-13 MED ORDER — PROTAMINE SULFATE 10 MG/ML IV SOLN: INTRAVENOUS | Status: DC | PRN

## 2012-06-13 MED ORDER — VANCOMYCIN HCL IN DEXTROSE 1-5 GM/200ML-% IV SOLN
1000.0000 mg | Freq: Once | INTRAVENOUS | Status: DC
  Filled 2012-06-13: qty 200

## 2012-06-13 MED ORDER — INSULIN ASPART 100 UNIT/ML ~~LOC~~ SOLN
0.0000 [IU] | SUBCUTANEOUS | Status: AC
  Administered 2012-06-13: 2 [IU] via SUBCUTANEOUS

## 2012-06-13 MED ORDER — ALBUMIN HUMAN 5 % IV SOLN
250.0000 mL | INTRAVENOUS | Status: AC | PRN
  Administered 2012-06-13: 250 mL via INTRAVENOUS

## 2012-06-13 MED ORDER — OXYCODONE HCL 5 MG PO TABS
5.0000 mg | ORAL_TABLET | ORAL | Status: DC | PRN
  Filled 2012-06-13: qty 1

## 2012-06-13 MED ORDER — SODIUM CHLORIDE 0.9 % IV SOLN
INTRAVENOUS | Status: DC
  Administered 2012-06-13 – 2012-06-14 (×2): via INTRAVENOUS

## 2012-06-13 MED ORDER — MILRINONE LOAD VIA INFUSION
INTRAVENOUS | Status: DC | PRN
  Administered 2012-06-13: 3200 ug via INTRAVENOUS

## 2012-06-13 MED ORDER — FAMOTIDINE IN NACL 20-0.9 MG/50ML-% IV SOLN
20.0000 mg | Freq: Two times a day (BID) | INTRAVENOUS | Status: AC
  Administered 2012-06-13: 20 mg via INTRAVENOUS
  Filled 2012-06-13: qty 50

## 2012-06-13 MED ORDER — SODIUM POLYSTYRENE SULFONATE 15 GM/60ML PO SUSP
60.0000 g | Freq: Once | ORAL | Status: AC
  Administered 2012-06-13: 60 g
  Filled 2012-06-13: qty 240

## 2012-06-13 MED ORDER — PROTAMINE SULFATE 10 MG/ML IV SOLN
INTRAVENOUS | Status: DC | PRN
  Administered 2012-06-13: 260 mg via INTRAVENOUS

## 2012-06-13 MED ORDER — DOCUSATE SODIUM 100 MG PO CAPS
200.0000 mg | ORAL_CAPSULE | Freq: Every day | ORAL | Status: DC
  Administered 2012-06-15 – 2012-06-16 (×2): 200 mg via ORAL
  Filled 2012-06-13 (×3): qty 2

## 2012-06-13 MED ORDER — GELATIN ABSORBABLE MT POWD
OROMUCOSAL | Status: DC | PRN
  Administered 2012-06-13 (×3): via TOPICAL

## 2012-06-13 MED ORDER — VECURONIUM BROMIDE 10 MG IV SOLR
INTRAVENOUS | Status: DC | PRN
  Administered 2012-06-13 (×3): 5 mg via INTRAVENOUS

## 2012-06-13 MED ORDER — LEVOFLOXACIN IN D5W 750 MG/150ML IV SOLN: 750.0000 mg | INTRAVENOUS | Status: DC

## 2012-06-13 MED ORDER — DOPAMINE-DEXTROSE 3.2-5 MG/ML-% IV SOLN
INTRAVENOUS | Status: DC | PRN
  Administered 2012-06-13: 3 ug/kg/min via INTRAVENOUS

## 2012-06-13 MED ORDER — SODIUM CHLORIDE 0.45 % IV SOLN
INTRAVENOUS | Status: DC
  Administered 2012-06-13 – 2012-06-14 (×2): via INTRAVENOUS

## 2012-06-13 MED ORDER — MAGNESIUM SULFATE 40 MG/ML IJ SOLN: 4.0000 g | Freq: Once | INTRAMUSCULAR | Status: DC

## 2012-06-13 MED ORDER — BISACODYL 5 MG PO TBEC
10.0000 mg | DELAYED_RELEASE_TABLET | Freq: Every day | ORAL | Status: DC
  Administered 2012-06-14 – 2012-06-16 (×3): 10 mg via ORAL
  Filled 2012-06-13 (×3): qty 2

## 2012-06-13 MED ORDER — MIDAZOLAM HCL 2 MG/2ML IJ SOLN: 2.0000 mg | INTRAMUSCULAR | Status: DC | PRN

## 2012-06-13 MED ORDER — MORPHINE SULFATE 2 MG/ML IJ SOLN
2.0000 mg | INTRAMUSCULAR | Status: DC | PRN
  Administered 2012-06-14: 4 mg via INTRAVENOUS
  Administered 2012-06-14: 2 mg via INTRAVENOUS
  Administered 2012-06-14: 4 mg via INTRAVENOUS
  Administered 2012-06-14 – 2012-06-15 (×5): 2 mg via INTRAVENOUS
  Filled 2012-06-13 (×2): qty 1
  Filled 2012-06-13: qty 2
  Filled 2012-06-13 (×3): qty 1
  Filled 2012-06-13: qty 2
  Filled 2012-06-13 (×3): qty 1

## 2012-06-13 MED ORDER — SODIUM CHLORIDE 0.9 % IV SOLN
250.0000 mL | INTRAVENOUS | Status: DC
  Administered 2012-06-14: 250 mL via INTRAVENOUS

## 2012-06-13 MED ORDER — NITROGLYCERIN IN D5W 200-5 MCG/ML-% IV SOLN
0.0000 ug/min | INTRAVENOUS | Status: DC
  Administered 2012-06-14: 70 ug/min via INTRAVENOUS
  Administered 2012-06-14: 80 ug/min via INTRAVENOUS
  Administered 2012-06-15: 20 ug/min via INTRAVENOUS
  Filled 2012-06-13 (×3): qty 250

## 2012-06-13 MED ORDER — FENTANYL CITRATE 0.05 MG/ML IJ SOLN
INTRAMUSCULAR | Status: DC | PRN
  Administered 2012-06-13: 100 ug via INTRAVENOUS
  Administered 2012-06-13: 50 ug via INTRAVENOUS
  Administered 2012-06-13: 100 ug via INTRAVENOUS
  Administered 2012-06-13 (×2): 50 ug via INTRAVENOUS
  Administered 2012-06-13: 900 ug via INTRAVENOUS
  Administered 2012-06-13: 250 ug via INTRAVENOUS

## 2012-06-13 MED ORDER — LACTATED RINGERS IV SOLN: INTRAVENOUS | Status: DC

## 2012-06-13 MED ORDER — ACETAMINOPHEN 10 MG/ML IV SOLN
1000.0000 mg | Freq: Once | INTRAVENOUS | Status: AC
  Administered 2012-06-13: 1000 mg via INTRAVENOUS
  Filled 2012-06-13: qty 100

## 2012-06-13 MED ORDER — SODIUM CHLORIDE 0.9 % IV SOLN
INTRAVENOUS | Status: DC
  Filled 2012-06-13: qty 1

## 2012-06-13 MED ORDER — DEXMEDETOMIDINE HCL IN NACL 200 MCG/50ML IV SOLN
0.1000 ug/kg/h | INTRAVENOUS | Status: DC
  Administered 2012-06-13: 0.5 ug/kg/h via INTRAVENOUS
  Administered 2012-06-14: 0.7 ug/kg/h via INTRAVENOUS
  Filled 2012-06-13 (×2): qty 50

## 2012-06-13 MED ORDER — METOPROLOL TARTRATE 25 MG/10 ML ORAL SUSPENSION
12.5000 mg | Freq: Two times a day (BID) | ORAL | Status: DC
  Filled 2012-06-13 (×9): qty 5

## 2012-06-13 SURGICAL SUPPLY — 114 items
ATTRACTOMAT 16X20 MAGNETIC DRP (DRAPES) ×3 IMPLANT
BAG DECANTER FOR FLEXI CONT (MISCELLANEOUS) ×3 IMPLANT
BANDAGE ELASTIC 4 VELCRO ST LF (GAUZE/BANDAGES/DRESSINGS) ×3 IMPLANT
BANDAGE ELASTIC 6 VELCRO ST LF (GAUZE/BANDAGES/DRESSINGS) ×3 IMPLANT
BANDAGE GAUZE ELAST BULKY 4 IN (GAUZE/BANDAGES/DRESSINGS) ×3 IMPLANT
BLADE STERNUM SYSTEM 6 (BLADE) ×3 IMPLANT
BLADE SURG 11 STRL SS (BLADE) ×3 IMPLANT
BLADE SURG ROTATE 9660 (MISCELLANEOUS) IMPLANT
CANISTER SUCTION 2500CC (MISCELLANEOUS) ×3 IMPLANT
CANN PRFSN .5XCNCT 15X34-48 (MISCELLANEOUS) ×2
CANNULA AORTIC HI-FLOW 6.5M20F (CANNULA) ×3 IMPLANT
CANNULA PRFSN .5XCNCT 15X34-48 (MISCELLANEOUS) ×2 IMPLANT
CANNULA VEN 2 STAGE (MISCELLANEOUS) ×4 IMPLANT
CATH CPB KIT GERHARDT (MISCELLANEOUS) ×3 IMPLANT
CATH THORACIC 28FR (CATHETERS) ×3 IMPLANT
CATH THORACIC 36FR (CATHETERS) IMPLANT
CATH THORACIC 36FR RT ANG (CATHETERS) IMPLANT
CLIP FOGARTY SPRING 6M (CLIP) ×3 IMPLANT
CLIP TI MEDIUM 24 (CLIP) IMPLANT
CLIP TI WIDE RED SMALL 24 (CLIP) ×3 IMPLANT
CLOTH BEACON ORANGE TIMEOUT ST (SAFETY) ×3 IMPLANT
CONN ST 1/4X3/8  BEN (MISCELLANEOUS) ×1
CONN ST 1/4X3/8 BEN (MISCELLANEOUS) ×2 IMPLANT
COVER SURGICAL LIGHT HANDLE (MISCELLANEOUS) ×3 IMPLANT
CRADLE DONUT ADULT HEAD (MISCELLANEOUS) ×3 IMPLANT
DERMABOND ADVANCED (GAUZE/BANDAGES/DRESSINGS) ×1
DERMABOND ADVANCED .7 DNX12 (GAUZE/BANDAGES/DRESSINGS) ×2 IMPLANT
DRAIN CHANNEL 28F RND 3/8 FF (WOUND CARE) ×3 IMPLANT
DRAPE CARDIOVASCULAR INCISE (DRAPES) ×1
DRAPE SLUSH/WARMER DISC (DRAPES) ×3 IMPLANT
DRAPE SRG 135X102X78XABS (DRAPES) ×2 IMPLANT
DRSG COVADERM 4X14 (GAUZE/BANDAGES/DRESSINGS) ×3 IMPLANT
ELECT BLADE 4.0 EZ CLEAN MEGAD (MISCELLANEOUS) ×3
ELECT CAUTERY BLADE 6.4 (BLADE) ×3 IMPLANT
ELECT REM PT RETURN 9FT ADLT (ELECTROSURGICAL) ×6
ELECTRODE BLDE 4.0 EZ CLN MEGD (MISCELLANEOUS) ×2 IMPLANT
ELECTRODE REM PT RTRN 9FT ADLT (ELECTROSURGICAL) ×4 IMPLANT
GLOVE BIO SURGEON STRL SZ 6 (GLOVE) ×3 IMPLANT
GLOVE BIO SURGEON STRL SZ 6.5 (GLOVE) ×15 IMPLANT
GLOVE BIO SURGEON STRL SZ7 (GLOVE) IMPLANT
GLOVE BIO SURGEON STRL SZ7.5 (GLOVE) IMPLANT
GLOVE BIOGEL PI IND STRL 6 (GLOVE) IMPLANT
GLOVE BIOGEL PI IND STRL 6.5 (GLOVE) IMPLANT
GLOVE BIOGEL PI IND STRL 7.0 (GLOVE) IMPLANT
GLOVE BIOGEL PI INDICATOR 6 (GLOVE)
GLOVE BIOGEL PI INDICATOR 6.5 (GLOVE)
GLOVE BIOGEL PI INDICATOR 7.0 (GLOVE)
GOWN STRL NON-REIN LRG LVL3 (GOWN DISPOSABLE) ×18 IMPLANT
HEMOSTAT POWDER SURGIFOAM 1G (HEMOSTASIS) ×9 IMPLANT
HEMOSTAT SURGICEL 2X14 (HEMOSTASIS) ×3 IMPLANT
INSERT FOGARTY 61MM (MISCELLANEOUS) IMPLANT
INSERT FOGARTY XLG (MISCELLANEOUS) IMPLANT
KIT BASIN OR (CUSTOM PROCEDURE TRAY) ×3 IMPLANT
KIT ROOM TURNOVER OR (KITS) ×3 IMPLANT
KIT SUCTION CATH 14FR (SUCTIONS) ×6 IMPLANT
KIT VASOVIEW W/TROCAR VH 2000 (KITS) ×3 IMPLANT
LEAD PACING MYOCARDI (MISCELLANEOUS) ×3 IMPLANT
MARKER GRAFT CORONARY BYPASS (MISCELLANEOUS) ×9 IMPLANT
NS IRRIG 1000ML POUR BTL (IV SOLUTION) ×18 IMPLANT
PACK OPEN HEART (CUSTOM PROCEDURE TRAY) ×3 IMPLANT
PAD ARMBOARD 7.5X6 YLW CONV (MISCELLANEOUS) ×6 IMPLANT
PENCIL BUTTON HOLSTER BLD 10FT (ELECTRODE) ×3 IMPLANT
PUNCH AORTIC ROTATE 4.0MM (MISCELLANEOUS) ×3 IMPLANT
PUNCH AORTIC ROTATE 4.5MM 8IN (MISCELLANEOUS) IMPLANT
PUNCH AORTIC ROTATE 5MM 8IN (MISCELLANEOUS) IMPLANT
SET CARDIOPLEGIA MPS 5001102 (MISCELLANEOUS) ×3 IMPLANT
SOLUTION ANTI FOG 6CC (MISCELLANEOUS) IMPLANT
SPONGE GAUZE 4X4 12PLY (GAUZE/BANDAGES/DRESSINGS) ×6 IMPLANT
SPONGE LAP 18X18 X RAY DECT (DISPOSABLE) ×6 IMPLANT
SPONGE LAP 4X18 X RAY DECT (DISPOSABLE) IMPLANT
SUT BONE WAX W31G (SUTURE) ×3 IMPLANT
SUT MNCRL AB 4-0 PS2 18 (SUTURE) ×3 IMPLANT
SUT PROLENE 3 0 SH DA (SUTURE) IMPLANT
SUT PROLENE 3 0 SH1 36 (SUTURE) ×3 IMPLANT
SUT PROLENE 4 0 RB 1 (SUTURE)
SUT PROLENE 4 0 SH DA (SUTURE) IMPLANT
SUT PROLENE 4 0 TF (SUTURE) ×6 IMPLANT
SUT PROLENE 4-0 RB1 .5 CRCL 36 (SUTURE) IMPLANT
SUT PROLENE 5 0 C 1 36 (SUTURE) IMPLANT
SUT PROLENE 6 0 C 1 30 (SUTURE) IMPLANT
SUT PROLENE 6 0 CC (SUTURE) ×12 IMPLANT
SUT PROLENE 7 0 BV 1 (SUTURE) IMPLANT
SUT PROLENE 7 0 BV1 MDA (SUTURE) ×6 IMPLANT
SUT PROLENE 7.0 RB 3 (SUTURE) IMPLANT
SUT PROLENE 8 0 BV175 6 (SUTURE) ×6 IMPLANT
SUT SILK  1 MH (SUTURE)
SUT SILK 1 MH (SUTURE) IMPLANT
SUT SILK 2 0 SH CR/8 (SUTURE) IMPLANT
SUT SILK 3 0 SH CR/8 (SUTURE) IMPLANT
SUT STEEL 6MS V (SUTURE) ×3 IMPLANT
SUT STEEL STERNAL CCS#1 18IN (SUTURE) ×3 IMPLANT
SUT STEEL SZ 6 DBL 3X14 BALL (SUTURE) ×3 IMPLANT
SUT VIC AB 1 CTX 18 (SUTURE) ×6 IMPLANT
SUT VIC AB 1 CTX 36 (SUTURE)
SUT VIC AB 1 CTX36XBRD ANBCTR (SUTURE) IMPLANT
SUT VIC AB 2-0 CT1 27 (SUTURE) ×1
SUT VIC AB 2-0 CT1 TAPERPNT 27 (SUTURE) ×2 IMPLANT
SUT VIC AB 2-0 CTX 27 (SUTURE) IMPLANT
SUT VIC AB 3-0 SH 27 (SUTURE)
SUT VIC AB 3-0 SH 27X BRD (SUTURE) IMPLANT
SUT VIC AB 3-0 X1 27 (SUTURE) IMPLANT
SUT VICRYL 4-0 PS2 18IN ABS (SUTURE) IMPLANT
SUTURE E-PAK OPEN HEART (SUTURE) ×3 IMPLANT
SYSTEM SAHARA CHEST DRAIN ATS (WOUND CARE) ×3 IMPLANT
TAPE CLOTH SURG 4X10 WHT LF (GAUZE/BANDAGES/DRESSINGS) ×3 IMPLANT
TAPE PAPER 2X10 WHT MICROPORE (GAUZE/BANDAGES/DRESSINGS) ×3 IMPLANT
TOWEL OR 17X24 6PK STRL BLUE (TOWEL DISPOSABLE) ×6 IMPLANT
TOWEL OR 17X26 10 PK STRL BLUE (TOWEL DISPOSABLE) ×6 IMPLANT
TRAY FOLEY IC TEMP SENS 14FR (CATHETERS) ×3 IMPLANT
TUBE FEEDING 8FR 16IN STR KANG (MISCELLANEOUS) ×3 IMPLANT
TUBE SUCT INTRACARD DLP 20F (MISCELLANEOUS) ×3 IMPLANT
TUBING INSUFFLATION 10FT LAP (TUBING) ×3 IMPLANT
UNDERPAD 30X30 INCONTINENT (UNDERPADS AND DIAPERS) ×3 IMPLANT
WATER STERILE IRR 1000ML POUR (IV SOLUTION) ×6 IMPLANT

## 2012-06-13 NOTE — Progress Notes (Signed)
Pt failed first attempt to wean, RR 7. Pts tongue very enlarged.

## 2012-06-13 NOTE — Progress Notes (Signed)
06/13/12 1822  Vitals  Temp 98.1 F (36.7 C)  ECG Heart Rate 90   Resp 12   Art Line  Arterial Line BP 142/70 mmHg  Arterial Line MAP (mmHg) 92 mmHg  Invasive Hemodynamic Monitoring  PAP 34/18 mmHg  PAP (Mean) 24 mmHg   Attempted to wean at IMV of 4. Patient would not breathe over ventilator. Vent back to IMV 12.

## 2012-06-13 NOTE — Progress Notes (Signed)
Pharmacy note: Re: post-op antibiotics  S/p CABG x 4.  ESRD.  Vancomycin 1250 mg IV and Levaquin 500 mg IV given pre-op.  Also received Levaquin 500 mg PO on 11/30 and 250 mg PO on 12/3 for UTI.   Dialyzed 12/2 and 12/3; planning HD 12/5, unless K+ warrants sooner.  Plan:  Cancel post-op Vancomycin and Levaquin doses.  Pre-op doses should provide adequate coverage.  Kelvin Cellar, RPh Pager: [phone] 06/13/2012 4pm

## 2012-06-13 NOTE — Anesthesia Preprocedure Evaluation (Signed)
Anesthesia Evaluation  Patient identified by MRN, date of birth, ID bandGeneral Assessment Comment:Patient sedated for line placement  Reviewed: Allergy & Precautions, H&P , NPO status , reviewed documented beta blocker date and time   History of Anesthesia Complications Negative for: history of anesthetic complications  Airway Mallampati: II TM Distance: >3 FB Neck ROM: Full    Dental  (+) Dental Advisory Given   Pulmonary neg pulmonary ROS,  breath sounds clear to auscultation  Pulmonary exam normal       Cardiovascular hypertension, Pt. on medications + CAD (cath 3v ASCADz), + Past MI, + Cardiac Stents, + Peripheral Vascular Disease and +CHF Rhythm:Regular Rate:Normal  TEE: inferior hypokinesis, EF 55%, mild MR   Neuro/Psych PSYCHIATRIC DISORDERS Anxiety TIA (L carotid stenosis)   GI/Hepatic Neg liver ROS, PUD, GERD-  Medicated and Controlled,  Endo/Other  negative endocrine ROS  Renal/GU ESRF and DialysisRenal disease (K+ 4.1, dialyzed yesterday)     Musculoskeletal   Abdominal   Peds  Hematology   Anesthesia Other Findings   Reproductive/Obstetrics                           Anesthesia Physical Anesthesia Plan  ASA: III  Anesthesia Plan: General   Post-op Pain Management:    Induction: Intravenous  Airway Management Planned: Oral ETT  Additional Equipment: Arterial line, PA Cath and TEE  Intra-op Plan:   Post-operative Plan: Post-operative intubation/ventilation  Informed Consent: I have reviewed the patients History and Physical, chart, labs and discussed the procedure including the risks, benefits and alternatives for the proposed anesthesia with the patient or authorized representative who has indicated his/her understanding and acceptance.   Dental advisory given  Plan Discussed with: CRNA and Surgeon  Anesthesia Plan Comments: (Plan routine monitors, A line, PA cath, GETA  with TEE, post op ventilation)        Anesthesia Quick Evaluation

## 2012-06-13 NOTE — Transfer of Care (Signed)
Immediate Anesthesia Transfer of Care Note  Patient: Shawna [name]  Procedure(s) Performed: Procedure(s) (LRB) with comments: CORONARY ARTERY BYPASS GRAFTING (CABG) (N/A) - cabg x four;  using left internal mammary artery, and left leg greater saphenous vein harvested endoscopically INTRAOPERATIVE TRANSESOPHAGEAL ECHOCARDIOGRAM (N/A)  Patient Location: SICU  Anesthesia Type:General  Level of Consciousness: sedated  Airway & Oxygen Therapy: Patient remains intubated per anesthesia plan and Patient placed on Ventilator (see vital sign flow sheet for setting)  Post-op Assessment: Report given to PACU RN and Post -op Vital signs reviewed and stable  Post vital signs: Reviewed and stable  Complications: No apparent anesthesia complications

## 2012-06-13 NOTE — Progress Notes (Signed)
[facility] BRIEF SICU PROGRESS NOTE  Day of Surgery  S/P Procedure(s) (LRB): CORONARY ARTERY BYPASS GRAFTING (CABG) (N/A) INTRAOPERATIVE TRANSESOPHAGEAL ECHOCARDIOGRAM (N/A)   Sedated on vent NSR w/ stable BP O2 sats 100% Chest tube output low Labs okay although K+  Plan: Will give kaexylate in OG tube for hyperkalemia in hope to avoid need for dialysis this early postop  OWEN,CLARENCE H 06/13/2012 6:53 PM

## 2012-06-13 NOTE — Brief Op Note (Addendum)
06/08/2012 - 06/13/2012  12:10 PM  PATIENT:  Shawna [name]  71 y.o. female  PRE-OPERATIVE DIAGNOSIS:  CAD  POST-OPERATIVE DIAGNOSIS:  CAD  PROCEDURE:  INTRAOPERATIVE TRANSESOPHAGEAL ECHOCARDIOGRAM, CORONARY ARTERY BYPASS GRAFTING (CABG) x 4 (LIMA to LAD, SVG to Ramus Intermediate, SVG to Circumflex, and SVG to RCA)  using left internal mammary artery, and left thigh and calf greater saphenous vein harvested endoscopically    SURGEON:  Surgeon(s) and Role:    * Grace Isaac, MD - Primary  PHYSICIAN ASSISTANT: Lars Pinks PA-C   ANESTHESIA:   general  EBL:  Total I/O In: -  Out: 10 [Urine:10]  Net 750 ml out  3 prbcs given on pump   DRAINS:  Chest Tube(s) in the Mediastinal and pleural spaces    COUNT CORRECT:  YES    DICTATION: dictated  PLAN OF CARE: Admit to inpatient   PATIENT DISPOSITION:  ICU - intubated and hemodynamically stable.   Delay start of Pharmacological VTE agent (>24hrs) due to surgical blood loss or risk of bleeding: yes  PRE OP WEIGHT: 63 kg

## 2012-06-13 NOTE — Progress Notes (Addendum)
ANTICOAGULATION CONSULT NOTE - Follow Up Consult  Pharmacy Consult for heparin Indication: 3 vessel CAD  Allergies  Allergen Reactions  . Aspirin Other (See Comments)    Mess up her stomach; "makes my bowels have blood in them". Takes 81 mg EC Aspirin   . Contrast Media (Iodinated Diagnostic Agents) Itching  . Iron Itching    "they gave me iron in dialysis; had to give me Benadryl cause I had to have the iron" (05/02/2012)  . Macrodantin (Nitrofurantoin Macrocrystal) Other (See Comments)    "broke me out in big old knots all over my body; had to go to ER"  . Penicillins Other (See Comments)    "makes me real weak when I take it; like I'll pass out"  . Plavix (Clopidogrel Bisulfate) Rash  . Bactrim (Sulfamethoxazole W-Trimethoprim) Rash  . Sulfa Antibiotics Rash  . Venofer (Ferric Oxide) Itching    Patient reports using Benadryl prior to doses as [facility]    Patient Measurements: Height: 5\' 1"  (154.9 cm) Weight: 138 lb 0.1 oz (62.6 kg) IBW/kg (Calculated) : 47.8    Vital Signs: Temp: 98.5 F (36.9 C) (12/04 0334) Temp src: Oral (12/03 2349) BP: 170/85 mmHg (12/04 0520) Pulse Rate: 94  (12/04 0358)  Labs:  Basename 06/13/12 0500 06/12/12 1620 06/12/12 0501 06/11/12 0728 06/11/12 0500  HGB 9.2* -- 8.2* -- --  HCT 28.7* -- 25.6* -- 26.0*  PLT 225 -- 190 -- 220  APTT -- 157* -- -- --  LABPROT -- 13.6 -- -- --  INR -- 1.05 -- -- --  HEPARINUNFRC 0.28* -- 0.60 -- 0.35  CREATININE 4.65* -- 6.58* 10.84* --  CKTOTAL -- -- -- -- --  CKMB -- -- -- -- --  TROPONINI -- -- -- -- --    Estimated Creatinine Clearance: 9.3 ml/min (by C-G formula based on Cr of 4.65).  Assessment: Patient is a 71 y.o F on heparin for CAD with plan for CABG   No bleeding noted.  Heparin level is slightly below goal .    Or schedule now reflects OHS for this am. Disregard increase in heparin as it will be off for surgery.  Curlene Dolphin

## 2012-06-13 NOTE — Progress Notes (Signed)
S:intubated and sedated O:BP 108/56  Pulse 90  Temp 97.2 F (36.2 C) (Oral)  Resp 12  Ht 5\' 1"  (1.549 m)  Wt 62.6 kg (138 lb 0.1 oz)  BMI 26.08 kg/m2  SpO2 99%  Intake/Output Summary (Last 24 hours) at 06/13/12 1447 Last data filed at 06/13/12 1428  Gross per 24 hour  Intake   1624 ml  Output   2394 ml  Net   -770 ml   Weight change: 1.7 kg (3 lb 12 oz) [id number] and sedated CVS:RRR Resp:clear Abd:+BS NTND Ext:no edema + bruit Lt UA AVF NEURO:sedated      . acetaminophen  1,000 mg Intravenous Once  . acetaminophen  1,000 mg Oral Q6H   Or  . acetaminophen (TYLENOL) oral liquid 160 mg/5 mL  975 mg Per Tube Q6H  . [COMPLETED] aminocaproic acid (AMICAR) for OHS   Intravenous To OR  . aspirin EC  81 mg Oral Daily  . atorvastatin  10 mg Oral q1800  . bisacodyl  10 mg Oral Daily   Or  . bisacodyl  10 mg Rectal Daily  . [COMPLETED] chlorhexidine  60 mL Topical Once  . darbepoetin (ARANESP) injection - DIALYSIS  100 mcg Intravenous Q Fri-HD  . [COMPLETED] dexmedetomidine  0.1-0.7 mcg/kg/hr Intravenous To OR  . docusate sodium  200 mg Oral Daily  . doxercalciferol  3 mcg Intravenous Q M,W,F-HD  . famotidine (PEPCID) IV  20 mg Intravenous Q12H  . fluticasone  2 spray Each Nare Daily  . folic acid  1 mg Oral Daily  . [COMPLETED] heparin-papaverine-plasmalyte irrigation   Irrigation To OR  . [COMPLETED] insulin (NOVOLIN-R) infusion   Intravenous To OR  . insulin regular  0-10 Units Intravenous TID WC  . lanthanum  1,000 mg Oral TID WC  . [COMPLETED] levofloxacin (LEVAQUIN) IV  500 mg Intravenous To OR  . levofloxacin (LEVAQUIN) IV  750 mg Intravenous Q24H  . magnesium sulfate  4 g Intravenous Once  . metoprolol tartrate  12.5 mg Oral BID   Or  . metoprolol tartrate  12.5 mg Per Tube BID  . [COMPLETED] metoprolol tartrate  12.5 mg Oral Once  . multivitamin  1 tablet Oral Daily  . pantoprazole  40 mg Oral Daily  . [COMPLETED] phenylephrine (NEO-SYNEPHRINE) Adult  infusion  30-200 mcg/min Intravenous To OR  . potassium chloride  10 mEq Intravenous Q1 Hr x 3  . sodium chloride  3 mL Intravenous Q12H  . [COMPLETED] vancomycin  1,250 mg Intravenous To OR  . vancomycin  1,000 mg Intravenous Once  . [DISCONTINUED] amLODipine  5 mg Oral Daily  . [DISCONTINUED] bisacodyl  5 mg Oral Once  . [DISCONTINUED] cefUROXime (ZINACEF)  IV  1.5 g Intravenous To OR  . [DISCONTINUED] cefUROXime (ZINACEF)  IV  750 mg Intravenous To OR  . [DISCONTINUED] cloNIDine  0.1 mg Oral BID  . [DISCONTINUED] DOPamine  2-20 mcg/kg/min Intravenous To OR  . [DISCONTINUED] epinephrine  0.5-20 mcg/min Intravenous To OR  . [DISCONTINUED] labetalol  200 mg Oral BID  . [DISCONTINUED] levofloxacin  250 mg Oral Q48H  . [DISCONTINUED] lisinopril  10 mg Oral Daily  . [DISCONTINUED] magnesium sulfate  40 mEq Other To OR  . [DISCONTINUED] metoprolol  5 mg Intravenous Once  . [DISCONTINUED] milrinone  0.125 mcg/kg/min Intravenous To OR  . [DISCONTINUED] nitroGLYCERIN  2-200 mcg/min Intravenous To OR  . [DISCONTINUED] pantoprazole  40 mg Oral BID  . [DISCONTINUED] potassium chloride  80 mEq Other To OR  No results found. BMET    Component Value Date/Time   NA 132* 06/13/2012 1425   K 5.5* 06/13/2012 1425   CL 97 06/13/2012 0500   CO2 28 06/13/2012 0500   GLUCOSE 157* 06/13/2012 1425   BUN 17 06/13/2012 0500   CREATININE 4.65* 06/13/2012 0500   CALCIUM 9.4 06/13/2012 0500   GFRNONAA 9* 06/13/2012 0500   GFRAA 10* 06/13/2012 0500   CBC    Component Value Date/Time   WBC 8.3 06/13/2012 0500   RBC 2.98* 06/13/2012 0500   HGB 14.6 06/13/2012 1425   HCT 43.0 06/13/2012 1425   PLT 113* 06/13/2012 1200   MCV 96.3 06/13/2012 0500   MCH 30.9 06/13/2012 0500   MCHC 32.1 06/13/2012 0500   RDW 23.3* 06/13/2012 0500   LYMPHSABS 1.0 01/17/2012 0528   MONOABS 1.3* 01/17/2012 0528   EOSABS 0.5 01/17/2012 0528   BASOSABS 0.1 01/17/2012 0528     Assessment: 1. CAD, SP CABG 2. Anemia, on aranesp 3. SecHPTH, on  hectoral 4. ESRD 5. Mild hyperkalemia post op Plan: 1.  Plan HD in AM unless K becomes a bigger issue tonight   Shawna [name] T

## 2012-06-13 NOTE — OR Nursing (Signed)
13:15pm -First call to SICU, call to vol. Desk to inform family off pump.  2nd call to SICU @ 14:45

## 2012-06-13 NOTE — Progress Notes (Signed)
Pt complaining of 9/10 pain radiating to her mid upper back/shoulder blades.  Pt requested a SL Nitroglycerin tablet and 1 tablet given to patient.  VS stable.  MD made aware.  No new orders given.  Will continue to monitor.

## 2012-06-13 NOTE — Progress Notes (Signed)
06/13/12 1600  Vitals  Temp 97.5 F (36.4 C)  BP 114/63 mmHg  MAP (mmHg) 74   ECG Heart Rate 91   Resp 12   Art Line  Arterial Line BP 109/64 mmHg  Arterial Line MAP (mmHg) 80 mmHg  Invasive Hemodynamic Monitoring  PAP 37/18 mmHg  PAP (Mean) 26 mmHg  CO (L/min) 2.6 L/min  CI (L/min/m2) 1.6 L/min/m2   Administered albumin 5% 250 ml IV slowly for CI of 1.6. Already on Milrinone 1.25 mcg/kg/min and dopamine at 3 mcg/kg/min. AAI pacing at 90.

## 2012-06-14 ENCOUNTER — Telehealth: Payer: Self-pay | Admitting: Vascular Surgery

## 2012-06-14 ENCOUNTER — Inpatient Hospital Stay (HOSPITAL_COMMUNITY): Payer: Medicare Other

## 2012-06-14 ENCOUNTER — Encounter (HOSPITAL_COMMUNITY): Payer: Self-pay | Admitting: Cardiothoracic Surgery

## 2012-06-14 LAB — POCT I-STAT, CHEM 8
BUN: 9 mg/dL (ref 6–23)
Calcium, Ion: 1.2 mmol/L (ref 1.13–1.30)
Hemoglobin: 12.2 g/dL (ref 12.0–15.0)
TCO2: 26 mmol/L (ref 0–100)

## 2012-06-14 LAB — CBC
HCT: 39.1 % (ref 36.0–46.0)
Hemoglobin: 13.2 g/dL (ref 12.0–15.0)
MCH: 29.9 pg (ref 26.0–34.0)
MCHC: 33.8 g/dL (ref 30.0–36.0)
MCHC: 32.8 g/dL (ref 30.0–36.0)
MCV: 91 fL (ref 78.0–100.0)
Platelets: 112 10*3/uL — ABNORMAL LOW (ref 150–400)
RBC: 4.4 MIL/uL (ref 3.87–5.11)
RDW: 22.5 % — ABNORMAL HIGH (ref 11.5–15.5)

## 2012-06-14 LAB — POCT I-STAT 3, ART BLOOD GAS (G3+)
Acid-base deficit: 4 mmol/L — ABNORMAL HIGH (ref 0.0–2.0)
O2 Saturation: 98 %
O2 Saturation: 98 %
Patient temperature: 37
pCO2 arterial: 44.8 mmHg (ref 35.0–45.0)
pH, Arterial: 7.306 — ABNORMAL LOW (ref 7.350–7.450)
pO2, Arterial: 112 mmHg — ABNORMAL HIGH (ref 80.0–100.0)
pO2, Arterial: 120 mmHg — ABNORMAL HIGH (ref 80.0–100.0)

## 2012-06-14 LAB — BASIC METABOLIC PANEL
BUN: 27 mg/dL — ABNORMAL HIGH (ref 6–23)
CO2: 21 mEq/L (ref 19–32)
Chloride: 97 mEq/L (ref 96–112)
GFR calc non Af Amer: 7 mL/min — ABNORMAL LOW (ref >90)
Glucose, Bld: 158 mg/dL — ABNORMAL HIGH (ref 70–99)
Potassium: 5.4 mEq/L — ABNORMAL HIGH (ref 3.5–5.1)
Sodium: 134 mEq/L — ABNORMAL LOW (ref 135–145)

## 2012-06-14 LAB — GLUCOSE, CAPILLARY
Glucose-Capillary: 88 mg/dL (ref 70–99)
Glucose-Capillary: 144 mg/dL — ABNORMAL HIGH (ref 70–99)
Glucose-Capillary: 165 mg/dL — ABNORMAL HIGH (ref 70–99)
Glucose-Capillary: 139 mg/dL — ABNORMAL HIGH (ref 70–99)

## 2012-06-14 IMAGING — CR DG CHEST 1V PORT
1 series · 1 of 1 positions shown · non-contrast
Comparison: 1 day prior

CLINICAL DATA: Status post coronary artery bypass graft.

PORTABLE CHEST - 1 VIEW

[AP]
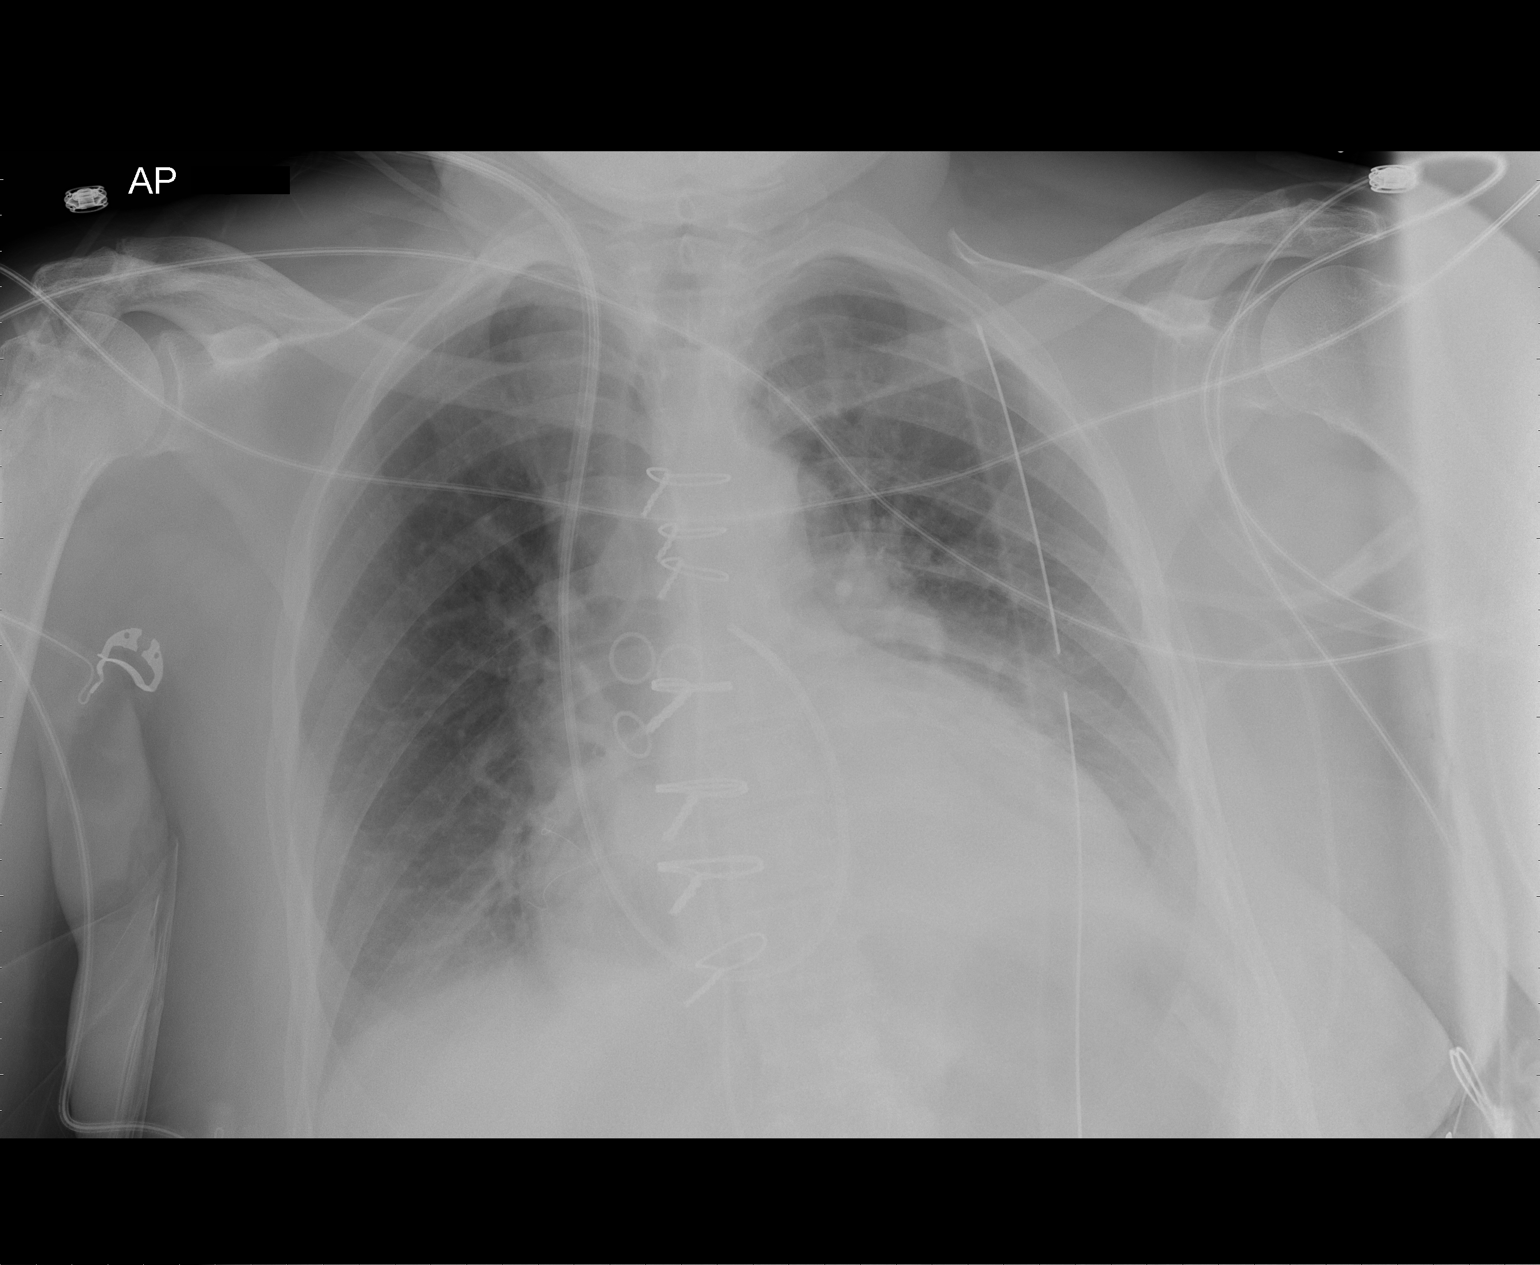

[1 of 1 positions shown; findings below may reference images not displayed]

FINDINGS: Extubation.  Removal of nasogastric tube.  Mediastinal
drain and left-sided chest tube remain in place.

Mild cardiomegaly.  Suspect small layering bilateral pleural
effusions. No pneumothorax.  Diminished lung volumes with new or
increased mild pulmonary venous congestion.  Decreased bibasilar
aeration with increased atelectasis.
IMPRESSION: 1.  Extubation, with slightly decreased aeration; increased
bibasilar atelectasis and development of mild pulmonary venous
congestion.
2.  Left chest tube remaining in place, without pneumothorax.
3.  Suspect trace bilateral pleural effusions.

## 2012-06-14 MED ORDER — TRAMADOL HCL 50 MG PO TABS
50.0000 mg | ORAL_TABLET | Freq: Three times a day (TID) | ORAL | Status: DC | PRN
  Administered 2012-06-14 – 2012-06-20 (×9): 50 mg via ORAL
  Filled 2012-06-14 (×11): qty 1

## 2012-06-14 MED ORDER — DARBEPOETIN ALFA-POLYSORBATE 100 MCG/0.5ML IJ SOLN
INTRAMUSCULAR | Status: AC
  Filled 2012-06-14: qty 0.5

## 2012-06-14 MED ORDER — INSULIN ASPART 100 UNIT/ML ~~LOC~~ SOLN
0.0000 [IU] | SUBCUTANEOUS | Status: DC
  Administered 2012-06-14 (×2): 2 [IU] via SUBCUTANEOUS
  Administered 2012-06-14: 4 [IU] via SUBCUTANEOUS

## 2012-06-14 MED ORDER — CLONIDINE HCL 0.2 MG PO TABS
0.2000 mg | ORAL_TABLET | Freq: Two times a day (BID) | ORAL | Status: DC
  Administered 2012-06-14 – 2012-06-19 (×7): 0.2 mg via ORAL
  Filled 2012-06-14 (×14): qty 1

## 2012-06-14 MED ORDER — AMLODIPINE BESYLATE 10 MG PO TABS
10.0000 mg | ORAL_TABLET | Freq: Every day | ORAL | Status: DC
  Administered 2012-06-14 – 2012-06-16 (×2): 10 mg via ORAL
  Filled 2012-06-14 (×4): qty 1

## 2012-06-14 MED ORDER — DOXERCALCIFEROL 4 MCG/2ML IV SOLN
INTRAVENOUS | Status: AC
  Administered 2012-06-14: 4 ug
  Filled 2012-06-14: qty 2

## 2012-06-14 MED FILL — Potassium Chloride Inj 2 mEq/ML: INTRAVENOUS | Qty: 40 | Status: AC

## 2012-06-14 MED FILL — Magnesium Sulfate Inj 50%: INTRAMUSCULAR | Qty: 10 | Status: AC

## 2012-06-14 NOTE — Plan of Care (Signed)
Problem: Phase II Progression Outcomes Goal: Patient extubated within - Outcome: Completed/Met Date Met:  06/14/12 6-12hrs. Extubation at 4am 06/14/12

## 2012-06-14 NOTE — Procedures (Signed)
Extubation Procedure Note  Patient Details:   Name: Shawna [name] DOB: 24-Nov-1940 MRN: [id number]   Airway Documentation:   Extubated to 4 lpm, VC 820 ml, NIF -25, patient able to lift and hold head off bed.  Able to breathe around deflated cuff and vocalize post procedure.  Tolerated well, no complications.    Evaluation  O2 sats: stable throughout Complications: No apparent complications Patient did tolerate procedure well. Bilateral Breath Sounds: Rhonchi (mild)   Yes  Matis Monnier, Elwyn Lade 06/14/2012, 4:41 AM

## 2012-06-14 NOTE — Progress Notes (Signed)
S:extubated  CO surgical pain O:BP 134/91  Pulse 95  Temp 98.8 F (37.1 C) (Core (Comment))  Resp 19  Ht 5\' 1"  (1.549 m)  Wt 68.04 kg (150 lb)  BMI 28.34 kg/m2  SpO2 99%  Intake/Output Summary (Last 24 hours) at 06/14/12 0741 Last data filed at 06/14/12 0700  Gross per 24 hour  Intake 3756.46 ml  Output    912 ml  Net 2844.46 ml   Weight change: -1.9 kg (-4 lb 3 oz) [id number] and alert CVS:RRR Resp:clear Abd:+BS NTND Ext:no edema + bruit Lt UA AVF NEURO:moves all extremities, ox3 Chest  2 chest tubes in place      . [COMPLETED] acetaminophen  1,000 mg Intravenous Once  . acetaminophen  1,000 mg Oral Q6H   Or  . acetaminophen (TYLENOL) oral liquid 160 mg/5 mL  975 mg Per Tube Q6H  . [COMPLETED] aminocaproic acid (AMICAR) for OHS   Intravenous To OR  . aspirin EC  81 mg Oral Daily  . atorvastatin  10 mg Oral q1800  . bisacodyl  10 mg Oral Daily   Or  . bisacodyl  10 mg Rectal Daily  . darbepoetin (ARANESP) injection - DIALYSIS  100 mcg Intravenous Q Fri-HD  . [COMPLETED] dexmedetomidine  0.1-0.7 mcg/kg/hr Intravenous To OR  . docusate sodium  200 mg Oral Daily  . doxercalciferol  3 mcg Intravenous Q M,W,F-HD  . famotidine (PEPCID) IV  20 mg Intravenous Q12H  . fluticasone  2 spray Each Nare Daily  . folic acid  1 mg Oral Daily  . [COMPLETED] heparin-papaverine-plasmalyte irrigation   Irrigation To OR  . [EXPIRED] insulin aspart  0-24 Units Subcutaneous Q2H   Followed by  . insulin aspart  0-24 Units Subcutaneous Q4H  . [COMPLETED] insulin (NOVOLIN-R) infusion   Intravenous To OR  . lanthanum  1,000 mg Oral TID WC  . [COMPLETED] levofloxacin (LEVAQUIN) IV  500 mg Intravenous To OR  . metoprolol tartrate  12.5 mg Oral BID   Or  . metoprolol tartrate  12.5 mg Per Tube BID  . multivitamin  1 tablet Oral Daily  . pantoprazole  40 mg Oral Daily  . [COMPLETED] phenylephrine (NEO-SYNEPHRINE) Adult infusion  30-200 mcg/min Intravenous To OR  . [EXPIRED] potassium  chloride  10 mEq Intravenous Q1 Hr x 3  . sodium chloride  3 mL Intravenous Q12H  . [COMPLETED] sodium polystyrene  60 g Per Tube Once  . [COMPLETED] vancomycin  1,250 mg Intravenous To OR  . [DISCONTINUED] amLODipine  5 mg Oral Daily  . [DISCONTINUED] bisacodyl  5 mg Oral Once  . [DISCONTINUED] cloNIDine  0.1 mg Oral BID  . [DISCONTINUED] DOPamine  2-20 mcg/kg/min Intravenous To OR  . [DISCONTINUED] epinephrine  0.5-20 mcg/min Intravenous To OR  . [DISCONTINUED] insulin aspart  0-24 Units Subcutaneous Q4H  . [DISCONTINUED] insulin regular  0-10 Units Intravenous TID WC  . [DISCONTINUED] labetalol  200 mg Oral BID  . [DISCONTINUED] levofloxacin (LEVAQUIN) IV  750 mg Intravenous Q24H  . [DISCONTINUED] levofloxacin  250 mg Oral Q48H  . [DISCONTINUED] lisinopril  10 mg Oral Daily  . [DISCONTINUED] magnesium sulfate  40 mEq Other To OR  . [DISCONTINUED] magnesium sulfate  4 g Intravenous Once  . [DISCONTINUED] metoprolol  5 mg Intravenous Once  . [DISCONTINUED] milrinone  0.125 mcg/kg/min Intravenous To OR  . [DISCONTINUED] nitroGLYCERIN  2-200 mcg/min Intravenous To OR  . [DISCONTINUED] pantoprazole  40 mg Oral BID  . [DISCONTINUED] potassium chloride  80 mEq  Other To OR  . [DISCONTINUED] vancomycin  1,000 mg Intravenous Once   Dg Chest Portable 1 View  06/13/2012  *RADIOLOGY REPORT*  Clinical Data: Status post CABG.  PORTABLE CHEST - 1 VIEW  Comparison: 06/09/2012  Findings: Endotracheal tube tip lies approximately 3 cm above the carina.  Swan-Ganz catheter tip is in the proximal right pulmonary artery.  A left chest tube is present without pneumothorax.  Left lower lobe atelectasis present as well as scattered subsegmental atelectasis in both lungs.  No evidence of overt pulmonary edema or significant pleural effusions.  The heart size and mediastinal contours are within normal limits.  IMPRESSION: Scattered atelectasis bilaterally.  No pneumothorax identified.   Original Report Authenticated  By: Aletta Edouard, M.D.    BMET    Component Value Date/Time   NA 134* 06/14/2012 0400   K 5.4* 06/14/2012 0400   CL 97 06/14/2012 0400   CO2 21 06/14/2012 0400   GLUCOSE 158* 06/14/2012 0400   BUN 27* 06/14/2012 0400   CREATININE 5.64* 06/14/2012 0400   CALCIUM 9.3 06/14/2012 0400   GFRNONAA 7* 06/14/2012 0400   GFRAA 8* 06/14/2012 0400   CBC    Component Value Date/Time   WBC 15.3* 06/14/2012 0400   RBC 4.40 06/14/2012 0400   HGB 13.2 06/14/2012 0400   HCT 39.1 06/14/2012 0400   PLT 122* 06/14/2012 0400   MCV 88.9 06/14/2012 0400   MCH 30.0 06/14/2012 0400   MCHC 33.8 06/14/2012 0400   RDW 22.6* 06/14/2012 0400   LYMPHSABS 1.0 01/17/2012 0528   MONOABS 1.3* 01/17/2012 0528   EOSABS 0.5 01/17/2012 0528   BASOSABS 0.1 01/17/2012 0528     Assessment: 1. CAD, SP CABG 2. Anemia, on aranesp 3. SecHPTH, on hectoral 4. ESRD 5. Mild hyperkalemia Plan: 1.  Plan HD today  Shawna [name] T

## 2012-06-14 NOTE — Progress Notes (Signed)
06/14/12 1500  Vitals  Temp 99.5 F (37.5 C)  BP ! 162/70 mmHg  MAP (mmHg) 92   Pulse Rate ! 103   ECG Heart Rate ! 104   Resp 19   Oxygen Therapy  SpO2 98 %  Art Line  Arterial Line BP 208/82 mmHg  Arterial Line MAP (mmHg) 125 mmHg  Invasive Hemodynamic Monitoring  PAP 34/16 mmHg  PAP (Mean) 23 mmHg   DR. Gerhardt notified of elevated SBP 180-220/74 arterial and 160-180 cuff. Also notified of rash on lower extremities and itching. Atarax given and nitroglycerin drip restarted and is at 100 mcg/min until oral medications begin to work. New orders for oral medications received.

## 2012-06-14 NOTE — Progress Notes (Addendum)
Right femoral pseudoaneurysm.  Needs repeat duplex as discharge approaches but would not repeat until near time of d/c.  Moderate carotid stenosis will need follow up duplex in 6 months.  I have scheduled this for her.  Ruta Hinds, MD [facility] Office: [phone] Pager: [phone]

## 2012-06-14 NOTE — Progress Notes (Signed)
Patient ID: Shawna [name], female   DOB: February 12, 1941, 71 y.o.   MRN: [id number] TCTS DAILY PROGRESS NOTE                   [address]            [place] [address]          [phone]      1 Day Post-Op Procedure(s) (LRB): CORONARY ARTERY BYPASS GRAFTING (CABG) (N/A) INTRAOPERATIVE TRANSESOPHAGEAL ECHOCARDIOGRAM (N/A)  Total Length of Stay:  LOS: 6 days   Subjective: Extubated awake and alert adquate pain control  Objective: Vital signs in last 24 hours: Temp:  [97.2 F (36.2 C)-99 F (37.2 C)] 98.8 F (37.1 C) (12/05 0700) Pulse Rate:  [65-95] 95  (12/05 0700) Cardiac Rhythm:  [-] Atrial paced (12/05 0400) Resp:  [12-22] 19  (12/05 0700) BP: (93-138)/(49-91) 134/91 mmHg (12/05 0700) SpO2:  [98 %-100 %] 99 % (12/05 0700) Arterial Line BP: (103-172)/(55-79) 148/68 mmHg (12/05 0700) FiO2 (%):  [40 %-50 %] 40 % (12/05 0310) Weight:  [138 lb 0.1 oz (62.6 kg)-150 lb (68.04 kg)] 150 lb (68.04 kg) (12/05 0500)  Filed Weights   06/13/12 0500 06/13/12 1430 06/14/12 0500  Weight: 138 lb 0.1 oz (62.6 kg) 138 lb 0.1 oz (62.6 kg) 150 lb (68.04 kg)    Weight change: -4 lb 3 oz (-1.9 kg)   Hemodynamic parameters for last 24 hours: PAP: (25-47)/(9-22) 32/17 mmHg CO:  [2.6 L/min-4 L/min] 4 L/min CI:  [1.6 L/min/m2-2.5 L/min/m2] 2.5 L/min/m2  Intake/Output from previous day: 12/04 0701 - 12/05 0700 In: 3756.5 [I.V.:1858.5; Blood:998; NG/GT:360; IV Piggyback:540] Out: [id number] [Urine:195; Emesis/NG output:100; Blood:387; Chest Tube:230]  Intake/Output this shift:    Current Meds: Scheduled Meds:   . [COMPLETED] acetaminophen  1,000 mg Intravenous Once  . acetaminophen  1,000 mg Oral Q6H   Or  . acetaminophen (TYLENOL) oral liquid 160 mg/5 mL  975 mg Per Tube Q6H  . [COMPLETED] aminocaproic acid (AMICAR) for OHS   Intravenous To OR  . aspirin EC  81 mg Oral Daily  . atorvastatin  10 mg Oral q1800  . bisacodyl  10 mg Oral Daily   Or  . bisacodyl  10 mg Rectal  Daily  . darbepoetin (ARANESP) injection - DIALYSIS  100 mcg Intravenous Q Fri-HD  . [COMPLETED] dexmedetomidine  0.1-0.7 mcg/kg/hr Intravenous To OR  . docusate sodium  200 mg Oral Daily  . doxercalciferol  3 mcg Intravenous Q M,W,F-HD  . famotidine (PEPCID) IV  20 mg Intravenous Q12H  . fluticasone  2 spray Each Nare Daily  . folic acid  1 mg Oral Daily  . [COMPLETED] heparin-papaverine-plasmalyte irrigation   Irrigation To OR  . [EXPIRED] insulin aspart  0-24 Units Subcutaneous Q2H   Followed by  . insulin aspart  0-24 Units Subcutaneous Q4H  . [COMPLETED] insulin (NOVOLIN-R) infusion   Intravenous To OR  . lanthanum  1,000 mg Oral TID WC  . [COMPLETED] levofloxacin (LEVAQUIN) IV  500 mg Intravenous To OR  . metoprolol tartrate  12.5 mg Oral BID   Or  . metoprolol tartrate  12.5 mg Per Tube BID  . multivitamin  1 tablet Oral Daily  . pantoprazole  40 mg Oral Daily  . [COMPLETED] phenylephrine (NEO-SYNEPHRINE) Adult infusion  30-200 mcg/min Intravenous To OR  . [EXPIRED] potassium chloride  10 mEq Intravenous Q1 Hr x 3  . sodium chloride  3 mL Intravenous Q12H   Continuous Infusions:   .  sodium chloride 20 mL/hr at 06/13/12 1430  . sodium chloride 20 mL/hr at 06/13/12 1442  . sodium chloride    . sodium chloride    . dexmedetomidine Stopped (06/14/12 0200)  . DOPamine 2.982 mcg/kg/min (06/13/12 2200)  . lactated ringers    . milrinone 0.125 mcg/kg/min (06/13/12 2200)  . nitroGLYCERIN 100 mcg/min (06/14/12 0700)  . phenylephrine (NEO-SYNEPHRINE) Adult infusion Stopped (06/13/12 1637)   PRN Meds:.albumin human, calcium carbonate (dosed in mg elemental calcium), camphor-menthol, feeding supplement (NEPRO CARB STEADY), hydrOXYzine, [EXPIRED] lactated ringers, loratadine, metoprolol, midazolam, [EXPIRED]  morphine injection, morphine injection, ondansetron (ZOFRAN) IV, oxyCODONE, sodium chloride, [DISCONTINUED] 0.9 % irrigation (POUR BTL), [DISCONTINUED] acetaminophen,  [DISCONTINUED] acetaminophen, [DISCONTINUED] ALPRAZolam [DISCONTINUED] diphenhydrAMINE, [DISCONTINUED] docusate sodium, [DISCONTINUED] docusate sodium, [DISCONTINUED] hemostatic agents, [DISCONTINUED] nitroGLYCERIN, [DISCONTINUED] ondansetron (ZOFRAN) IV, [DISCONTINUED] ondansetron (ZOFRAN) IV, [DISCONTINUED] ondansetron, [DISCONTINUED] sorbitol, [DISCONTINUED] Surgifoam 1 Gm with 0.9% sodium chloride (4 ml) topical solution, [DISCONTINUED] traMADol, [DISCONTINUED] zolpidem  General appearance: alert and cooperative Neurologic: intact Heart: regular rate and rhythm, S1, S2 normal, no murmur, click, rub or gallop and normal apical impulse Lungs: diminished breath sounds bibasilar Abdomen: soft, non-tender; bowel sounds normal; no masses,  no organomegaly Extremities: extremities normal, atraumatic, no cyanosis or edema and Homans sign is negative, no sign of DVT Rt groin pain improved no expansion since preop Left arm graft patent  Lab Results: CBC: Basename 06/14/12 0400 06/13/12 2023 06/13/12 2010  WBC 15.3* -- 19.4*  HGB 13.2 15.0 --  HCT 39.1 44.0 --  PLT 122* -- 146*   BMET:  Basename 06/14/12 0400 06/13/12 2023 06/13/12 0500  NA 134* 131* --  K 5.4* 5.6* --  CL 97 100 --  CO2 21 -- 28  GLUCOSE 158* 161* --  BUN 27* 22 --  CREATININE 5.64* 4.70* --  CALCIUM 9.3 -- 9.4    PT/INR:  Basename 06/13/12 1421  LABPROT 16.3*  INR 1.34   Radiology: Dg Chest Portable 1 View  06/13/2012  *RADIOLOGY REPORT*  Clinical Data: Status post CABG.  PORTABLE CHEST - 1 VIEW  Comparison: 06/09/2012  Findings: Endotracheal tube tip lies approximately 3 cm above the carina.  Swan-Ganz catheter tip is in the proximal right pulmonary artery.  A left chest tube is present without pneumothorax.  Left lower lobe atelectasis present as well as scattered subsegmental atelectasis in both lungs.  No evidence of overt pulmonary edema or significant pleural effusions.  The heart size and mediastinal contours  are within normal limits.  IMPRESSION: Scattered atelectasis bilaterally.  No pneumothorax identified.   Original Report Authenticated By: Aletta Edouard, M.D.      Assessment/Plan: S/P Procedure(s) (LRB): CORONARY ARTERY BYPASS GRAFTING (CABG) (N/A) INTRAOPERATIVE TRANSESOPHAGEAL ECHOCARDIOGRAM (N/A) Mobilize Diabetes control See progression orders Expected Acute  Blood - loss Anemia preop rt groin pseudoaneurysm  D/c foley Wait on dialysis   Brooklynn Brandenburg B 06/14/2012 8:05 AM

## 2012-06-14 NOTE — Progress Notes (Signed)
Patient ID: Shawna [name], female   DOB: 16-May-1941, 71 y.o.   MRN: [id number]  SICU Evening Rounds:  Hypertensive earlier. Now 136/70 on Catapress, Lopressor, NTG at 80 mcg/min.  Tolerated HD.

## 2012-06-14 NOTE — Telephone Encounter (Signed)
Message copied by Gena Fray on Thu Jun 14, 2012  9:34 AM ------      Message from: Ruta Hinds E      Created: Thu Jun 14, 2012  8:29 AM       Please schedule pt for repeat duplex scan and appt with Rusty in 6 months to follow up asymptomatic moderate carotid stenosis.            Shawna [name]

## 2012-06-14 NOTE — Telephone Encounter (Signed)
Patient appointment letter sent,dpm

## 2012-06-15 ENCOUNTER — Inpatient Hospital Stay (HOSPITAL_COMMUNITY): Payer: Medicare Other

## 2012-06-15 LAB — CBC
HCT: 33.3 % — ABNORMAL LOW (ref 36.0–46.0)
Hemoglobin: 10.4 g/dL — ABNORMAL LOW (ref 12.0–15.0)
MCH: 28.5 pg (ref 26.0–34.0)
MCHC: 31.2 g/dL (ref 30.0–36.0)
MCV: 91.2 fL (ref 78.0–100.0)
Platelets: 112 10*3/uL — ABNORMAL LOW (ref 150–400)
RBC: 3.65 MIL/uL — ABNORMAL LOW (ref 3.87–5.11)
RDW: 21.7 % — ABNORMAL HIGH (ref 11.5–15.5)
WBC: 12.7 10*3/uL — ABNORMAL HIGH (ref 4.0–10.5)

## 2012-06-15 LAB — GLUCOSE, CAPILLARY
Glucose-Capillary: 108 mg/dL — ABNORMAL HIGH (ref 70–99)
Glucose-Capillary: 110 mg/dL — ABNORMAL HIGH (ref 70–99)
Glucose-Capillary: 119 mg/dL — ABNORMAL HIGH (ref 70–99)
Glucose-Capillary: 91 mg/dL (ref 70–99)

## 2012-06-15 LAB — BASIC METABOLIC PANEL
BUN: 15 mg/dL (ref 6–23)
CO2: 26 mEq/L (ref 19–32)
Calcium: 8.6 mg/dL (ref 8.4–10.5)
Chloride: 94 mEq/L — ABNORMAL LOW (ref 96–112)
Creatinine, Ser: 4.39 mg/dL — ABNORMAL HIGH (ref 0.50–1.10)
GFR calc Af Amer: 11 mL/min — ABNORMAL LOW (ref >90)
GFR calc non Af Amer: 9 mL/min — ABNORMAL LOW (ref >90)
Glucose, Bld: 89 mg/dL (ref 70–99)
Potassium: 4.1 mEq/L (ref 3.5–5.1)
Sodium: 133 mEq/L — ABNORMAL LOW (ref 135–145)

## 2012-06-15 IMAGING — CR DG CHEST 1V PORT
1 series · 1 of 1 positions shown · non-contrast
Comparison: [DATE]; [DATE]

CLINICAL DATA: Postop CABG

PORTABLE CHEST - 1 VIEW

[AP]
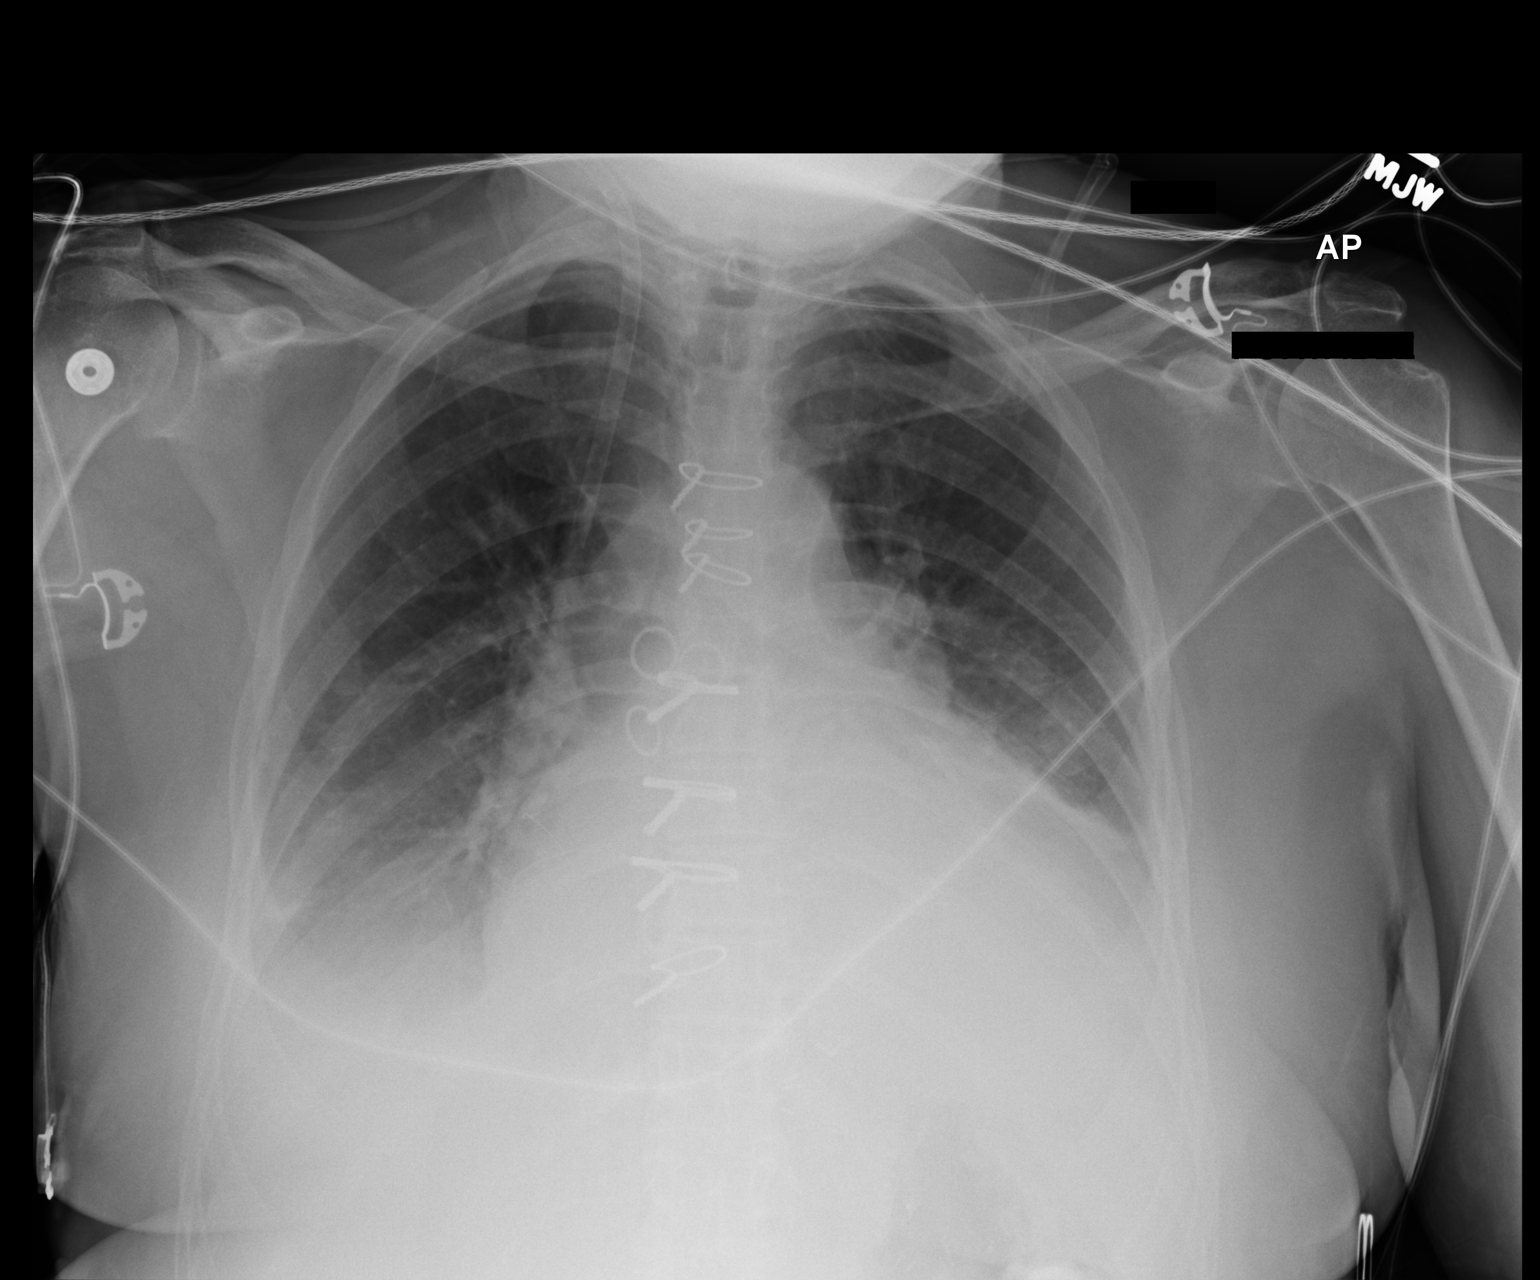

[1 of 1 positions shown; findings below may reference images not displayed]

FINDINGS: Grossly unchanged enlarged cardiac silhouette and
mediastinal contours post median sternotomy and CABG.  Interval
removal of right jugular approach PA catheter with remaining
vascular sheath tip projected over the mid SVC.  Interval removal
of left-sided chest tube and mediastinal drain.  No definite
pneumothorax.  Small bilateral effusions and bibasilar
heterogeneous opacities are unchanged.  Mild pulmonary venous
congestion without frank evidence of edema.  Unchanged bones.
IMPRESSION: 1.  Interval removal of support apparatus as above.  No
pneumothorax.
2.  Pulmonary congestion without frank evidence of edema.
3.  Grossly unchanged small bilateral effusions and bibasilar
opacities, likely atelectasis.

## 2012-06-15 MED ORDER — DARBEPOETIN ALFA-POLYSORBATE 100 MCG/0.5ML IJ SOLN
100.0000 ug | INTRAMUSCULAR | Status: DC
  Administered 2012-06-16: 100 ug via INTRAVENOUS

## 2012-06-15 MED ORDER — ENOXAPARIN SODIUM 30 MG/0.3ML ~~LOC~~ SOLN
30.0000 mg | SUBCUTANEOUS | Status: DC
  Administered 2012-06-15 – 2012-06-20 (×5): 30 mg via SUBCUTANEOUS
  Filled 2012-06-15 (×8): qty 0.3

## 2012-06-15 MED ORDER — SODIUM CHLORIDE 0.9 % IJ SOLN
10.0000 mL | Freq: Two times a day (BID) | INTRAMUSCULAR | Status: DC
  Administered 2012-06-15: 10 mL

## 2012-06-15 MED ORDER — SODIUM CHLORIDE 0.9 % IJ SOLN: 10.0000 mL | INTRAMUSCULAR | Status: DC | PRN

## 2012-06-15 MED ORDER — INSULIN ASPART 100 UNIT/ML ~~LOC~~ SOLN
0.0000 [IU] | Freq: Three times a day (TID) | SUBCUTANEOUS | Status: DC
  Administered 2012-06-16: 4 [IU] via SUBCUTANEOUS
  Administered 2012-06-17 – 2012-06-18 (×3): 2 [IU] via SUBCUTANEOUS

## 2012-06-15 MED ORDER — NITROGLYCERIN IN D5W 200-5 MCG/ML-% IV SOLN: 0.0000 ug/min | INTRAVENOUS | Status: DC

## 2012-06-15 MED FILL — Sodium Chloride Irrigation Soln 0.9%: Qty: 3000 | Status: AC

## 2012-06-15 MED FILL — Heparin Sodium (Porcine) Inj 1000 Unit/ML: INTRAMUSCULAR | Qty: 30 | Status: AC

## 2012-06-15 MED FILL — Sodium Chloride IV Soln 0.9%: INTRAVENOUS | Qty: 1000 | Status: AC

## 2012-06-15 MED FILL — Mannitol IV Soln 20%: INTRAVENOUS | Qty: 500 | Status: AC

## 2012-06-15 MED FILL — Electrolyte-R (PH 7.4) Solution: INTRAVENOUS | Qty: 4000 | Status: AC

## 2012-06-15 MED FILL — Lidocaine HCl IV Inj 20 MG/ML: INTRAVENOUS | Qty: 5 | Status: AC

## 2012-06-15 MED FILL — Sodium Bicarbonate IV Soln 8.4%: INTRAVENOUS | Qty: 50 | Status: AC

## 2012-06-15 MED FILL — Heparin Sodium (Porcine) Inj 1000 Unit/ML: INTRAMUSCULAR | Qty: 10 | Status: AC

## 2012-06-15 NOTE — Progress Notes (Signed)
Patient ID: Shawna [name], female   DOB: 1941/03/20, 71 y.o.   MRN: [id number] TCTS DAILY PROGRESS NOTE                   [address]            [place] [address]          [phone]      2 Days Post-Op Procedure(s) (LRB): CORONARY ARTERY BYPASS GRAFTING (CABG) (N/A) INTRAOPERATIVE TRANSESOPHAGEAL ECHOCARDIOGRAM (N/A)  Total Length of Stay:  LOS: 7 days   Subjective: Awake and alert.  Objective: Vital signs in last 24 hours: Temp:  [98.1 F (36.7 C)-99.7 F (37.6 C)] 99.1 F (37.3 C) (12/06 0800) Pulse Rate:  [89-109] 90  (12/06 0700) Cardiac Rhythm:  [-] Atrial paced (12/06 0400) Resp:  [13-26] 16  (12/06 0700) BP: (92-215)/(45-170) 116/46 mmHg (12/06 0700) SpO2:  [96 %-100 %] 98 % (12/06 0700) Arterial Line BP: (106-220)/(54-89) 122/56 mmHg (12/06 0500) Weight:  [147 lb 7.8 oz (66.9 kg)-151 lb 10.8 oz (68.8 kg)] 147 lb 7.8 oz (66.9 kg) (12/06 0500)  Filed Weights   06/14/12 0910 06/14/12 1329 06/15/12 0500  Weight: 150 lb (68.04 kg) 151 lb 10.8 oz (68.8 kg) 147 lb 7.8 oz (66.9 kg)    Weight change: 11 lb 15.9 oz (5.44 kg)   Hemodynamic parameters for last 24 hours: PAP: (24-40)/(9-20) 40/20 mmHg CO:  [3.2 L/min-3.6 L/min] 3.6 L/min CI:  [2 L/min/m2-2.3 L/min/m2] 2.3 L/min/m2  Intake/Output from previous day: 12/05 0701 - 12/06 0700 In: 2530.2 [P.O.:1070; I.V.:1460.2] Out: 2130 [Urine:10; Chest Tube:20]  Intake/Output this shift:    Current Meds: Scheduled Meds:   . acetaminophen  1,000 mg Oral Q6H   Or  . acetaminophen (TYLENOL) oral liquid 160 mg/5 mL  975 mg Per Tube Q6H  . amLODipine  10 mg Oral Daily  . aspirin EC  81 mg Oral Daily  . atorvastatin  10 mg Oral q1800  . bisacodyl  10 mg Oral Daily   Or  . bisacodyl  10 mg Rectal Daily  . cloNIDine  0.2 mg Oral BID  . darbepoetin (ARANESP) injection - DIALYSIS  100 mcg Intravenous Q Fri-HD  . docusate sodium  200 mg Oral Daily  . [COMPLETED] doxercalciferol      .  doxercalciferol  3 mcg Intravenous Q M,W,F-HD  . [EXPIRED] famotidine (PEPCID) IV  20 mg Intravenous Q12H  . fluticasone  2 spray Each Nare Daily  . folic acid  1 mg Oral Daily  . insulin aspart  0-24 Units Subcutaneous Q4H  . lanthanum  1,000 mg Oral TID WC  . metoprolol tartrate  12.5 mg Oral BID   Or  . metoprolol tartrate  12.5 mg Per Tube BID  . multivitamin  1 tablet Oral Daily  . pantoprazole  40 mg Oral Daily  . sodium chloride  3 mL Intravenous Q12H  . [DISCONTINUED] insulin aspart  0-24 Units Subcutaneous Q4H   Continuous Infusions:   . sodium chloride 20 mL/hr at 06/14/12 1534  . sodium chloride 20 mL/hr at 06/14/12 1531  . sodium chloride 250 mL (06/14/12 0945)  . sodium chloride    . dexmedetomidine Stopped (06/14/12 0200)  . DOPamine Stopped (06/14/12 1345)  . lactated ringers    . nitroGLYCERIN 10 mcg/min (06/15/12 0700)  . phenylephrine (NEO-SYNEPHRINE) Adult infusion Stopped (06/13/12 1637)  . [DISCONTINUED] milrinone Stopped (06/14/12 0830)   PRN Meds:.[EXPIRED] albumin human, calcium carbonate (dosed in mg elemental  calcium), camphor-menthol, feeding supplement (NEPRO CARB STEADY), hydrOXYzine, loratadine, metoprolol, midazolam, morphine injection, ondansetron (ZOFRAN) IV, sodium chloride, traMADol, [DISCONTINUED] oxyCODONE  General appearance: alert and cooperative Neurologic: intact Heart: regular rate and rhythm, S1, S2 normal, no murmur, click, rub or gallop and normal apical impulse Lungs: clear to auscultation bilaterally and normal percussion bilaterally Abdomen: soft, non-tender; bowel sounds normal; no masses,  no organomegaly Extremities: extremities normal, atraumatic, no cyanosis or edema, Homans sign is negative, no sign of DVT and rt groin stable Wound: sternum stable  Lab Results: CBC: Basename 06/15/12 0410 06/14/12 1736 06/14/12 1727  WBC 12.7* -- 12.4*  HGB 10.4* 12.2 --  HCT 33.3* 36.0 --  PLT 112* -- 112*   BMET:  Basename 06/15/12  0410 06/14/12 1736 06/14/12 0400  NA 133* 134* --  K 4.1 3.9 --  CL 94* 95* --  CO2 26 -- 21  GLUCOSE 89 141* --  BUN 15 9 --  CREATININE 4.39* 2.90* --  CALCIUM 8.6 -- 9.3    PT/INR:  Basename 06/13/12 1421  LABPROT 16.3*  INR 1.34   Radiology: Dg Chest Portable 1 View In Am  06/14/2012  *RADIOLOGY REPORT*  Clinical Data: Status post coronary artery bypass graft.  PORTABLE CHEST - 1 VIEW  Comparison: 1 day prior  Findings: Extubation.  Removal of nasogastric tube.  Mediastinal drain and left-sided chest tube remain in place.  Mild cardiomegaly.  Suspect small layering bilateral pleural effusions. No pneumothorax.  Diminished lung volumes with new or increased mild pulmonary venous congestion.  Decreased bibasilar aeration with increased atelectasis.  IMPRESSION:  1.  Extubation, with slightly decreased aeration; increased bibasilar atelectasis and development of mild pulmonary venous congestion. 2.  Left chest tube remaining in place, without pneumothorax. 3.  Suspect trace bilateral pleural effusions.   Original Report Authenticated By: Abigail Miyamoto, M.D.    Dg Chest Portable 1 View  06/13/2012  *RADIOLOGY REPORT*  Clinical Data: Status post CABG.  PORTABLE CHEST - 1 VIEW  Comparison: 06/09/2012  Findings: Endotracheal tube tip lies approximately 3 cm above the carina.  Swan-Ganz catheter tip is in the proximal right pulmonary artery.  A left chest tube is present without pneumothorax.  Left lower lobe atelectasis present as well as scattered subsegmental atelectasis in both lungs.  No evidence of overt pulmonary edema or significant pleural effusions.  The heart size and mediastinal contours are within normal limits.  IMPRESSION: Scattered atelectasis bilaterally.  No pneumothorax identified.   Original Report Authenticated By: Aletta Edouard, M.D.      Assessment/Plan: S/P Procedure(s) (LRB): CORONARY ARTERY BYPASS GRAFTING (CABG) (N/A) INTRAOPERATIVE TRANSESOPHAGEAL ECHOCARDIOGRAM  (N/A) Mobilize d/c tubes/lines See progression orders dvt prophylaxis K level ok this am     [name] 06/15/2012 8:01 AM

## 2012-06-15 NOTE — Op Note (Signed)
Shawna [name], Shawna [name] NO.:  [id number]  MEDICAL RECORD NO.:  [id number]  LOCATION:  [id number]                         FACILITY:  [facility]  PHYSICIAN:  Lanelle Bal, MD    DATE OF BIRTH:  [birth year]  DATE OF PROCEDURE:  06/13/2012 DATE OF DISCHARGE:                              OPERATIVE REPORT   PREOPERATIVE DIAGNOSES:  Coronary occlusive disease with left ventricular dysfunction, inferior hypokinesis.  POSTOPERATIVE DIAGNOSES:  Coronary occlusive disease with left ventricular dysfunction, inferior hypokinesis.  SURGICAL PROCEDURE:  Coronary artery bypass grafting x4 with a left internal mammary to the left anterior descending coronary artery, reverse saphenous vein graft to the distal right coronary artery, reverse saphenous vein graft to the intermediate coronary artery and reverse saphenous vein graft to the circumflex coronary artery with left thigh and calf endovein harvesting.  SURGEON:  Lanelle Bal, MD  FIRST ASSISTANT:  Lars Pinks, [place].  BRIEF HISTORY:  The patient is a 71 year old female on chronic hemodialysis, who had known coronary occlusive disease presenting in June 2013.  In June and July, she had stents placed in the small branch of the circumflex and the diagonal and ultimately in the ostium of the right coronary artery.  At that time, she had normal LV function.  In Fall of 2013, she began having unstable anginal symptoms with minimal exertion and was hospitalized twice at [facility] and once at [facility] in October with recurrent chest pain.  She was discharged home without a repeat cardiac catheterization and returned on this admission with increasing symptoms of shortness of breath and chest discomfort and was admitted, underwent cardiac catheterization which revealed a very high grade greater than 95% restenosis of the stent of the right coronary artery.  In addition, the stent in the circumflex was also restenosed.  The  stent in the LAD was patent.  In addition, the patient had 60-70% proximal LAD disease and 75% stenosis of a large intermediate branch.  Left ventricular function had decreased approximately 40% with inferior akinesis.  Preoperative echo showed mild mitral regurg, carotid disease with 60-80% stenosis.  Because of the patient's critical disease, especially of the right coronary artery, coronary artery bypass grafting was recommended.  Risks and options were discussed with the patient in detail and she was willing to proceed.  DESCRIPTION OF PROCEDURE:  The patient underwent general endotracheal anesthesia without incident.  Prior to anesthesia, blood pressure was very elevated with a systolic of over [id number].  Hemodynamics improved after she was anesthetized and intubated.  TEE probe was placed.  She had intact structure of the mitral leaflet.  She did have moderate mitral regurgitation without annular dilatation, probably ischemic in origin. She was prepped with Betadine and draped in a sterile manner.  Using the [employer] endo vein harvesting system, vein was harvested from the left thigh and calf and was of adequate quality and caliber.  The left leg was used because of significant hematoma in the right thigh from her cardiac cath.  A median sternotomy was performed.  Left internal mammary artery was dissected down as pedicle graft.  Distal artery was divided, had good free flow.  Pericardium was opened and  she had 50-100 mL of bloody pericardial effusion, but without any obvious source of intracardiac pericardial bleeding.  She was systemically heparinized. The ascending aorta was cannulated.  Right atrium was cannulated. Aortic root vent and cardioplegia needle was introduced into the ascending aorta.  The patient was placed on cardiopulmonary bypass 2.4 L/minute per sq. m.  Sites of anastomosis were selected and dissected out epicardium.  The patient's right coronary artery was very  large. The distal circumflex was very small in the AV groove.  The intermediate was of moderate size.  LAD was of moderate size admitting a 1.5-mm probe distally.  The patient's body temperature was cooled to 32 degrees. Aortic crossclamp was applied.  500 mL of cold blood potassium cardioplegia was administered in the aortic root.  Topical cold was also used.  Myocardial septal temperature was monitored throughout the crossclamp.  Attention was turned first to the distal right coronary artery, which was opened and admitted 1.5-mm probe distally without difficulty.  Using a running 7-0 Prolene, distal anastomosis was performed.  Attention was then turned to the small circumflex branch in the AV groove.  This vessel was opened and was approximately 1.2-1.3 mm in size.  Using a running 8-0 Prolene, a distal anastomosis was performed with a reverse saphenous vein.  Attention was then turned to the partially intramyocardial intermediate vessel, which was opened 1.5 mm in size. Using a running 7-0 Prolene, a distal anastomosis was performed. Attention was then turned to the left anterior descending coronary artery between the mid and distal vessel, the LAD was opened.  Using a running 8-0 Prolene, left internal mammary artery was anastomosed to the left anterior descending coronary artery.  The bulldog was removed from the mammary artery with prompt rise in myocardial septal temperature. The bulldog placed back on the mammary artery.  Additional cold blood cardioplegia was administered.  With the crossclamp still in place, 3 punch aortotomies were performed and each of 3 vein grafts were anastomosed to the ascending aorta.  Air was evacuated from the grafts and ascending aorta.  The aortic crossclamp was removed.  Prior to this, the bulldog on the mammary artery was removed and there was prompt rise in myocardial septal temperature.  The patient was started on milrinone infusion and low-dose  dopamine.  She was then ventilated and weaned from the cardiopulmonary bypass.  Initially separating from bypass, her right ventricle appeared somewhat weakened and dilated.  There was moderate mitral regurgitation.  However, with a few minutes at a time in regulating her volume status, the right ventricle became more vigorous. The mitral regurgitation was approximately small central jet to the midportion of atrium, 2+ but it was not felt that it was severe enough to warrant replacement or further intervention on mitral valve.  Atrial and ventricular pacing wires had been applied, and the patient was atrially paced, increased rate.  Sites of anastomosis were inspected and were free of bleeding.  She was decannulated in usual fashion. Protamine sulfate was administered with operative field hemostatic.  A left pleural tube and a Blake mediastinal drain were left in place. Sternum was closed with #6 stainless steel wire.  Fascia closed with interrupted 0 Vicryl, running 3-0 Vicryl in subcutaneous tissue, 4-0 subcuticular stitch in skin, and dry dressings were applied.  Sponge and needle count was reported as correct at completion of procedure. Because of significant preoperative anemia, the patient did require 3 units.     Lanelle Bal, MD     EG/MEDQ  D:  06/14/2012  T:  06/15/2012  Job:  [id number]  cc:   Carlena Bjornstad, MD, [facility]

## 2012-06-15 NOTE — Plan of Care (Signed)
Problem: Phase I Progression Outcomes Goal: OOB as tolerated unless otherwise ordered Outcome: Progressing Up out of bed for weight

## 2012-06-15 NOTE — Anesthesia Postprocedure Evaluation (Signed)
  Anesthesia Post-op Note  Patient: [name]  Procedure(s) Performed: Procedure(s) (LRB) with comments: CORONARY ARTERY BYPASS GRAFTING (CABG) (N/A) - cabg x four;  using left internal mammary artery, and left leg greater saphenous vein harvested endoscopically INTRAOPERATIVE TRANSESOPHAGEAL ECHOCARDIOGRAM (N/A)  Patient Location: SICU  Anesthesia Type:General  Level of Consciousness: awake, alert  and oriented  Airway and Oxygen Therapy: Patient Spontanous Breathing and Patient connected to nasal cannula oxygen  Post-op Pain: none  Post-op Assessment: Post-op Vital signs reviewed, Patient's Cardiovascular Status Stable, Respiratory Function Stable, Patent Airway and No signs of Nausea or vomiting  Post-op Vital Signs: Reviewed and stable  Complications: No apparent anesthesia complications

## 2012-06-15 NOTE — Progress Notes (Signed)
S:feeling well  Chest tubes out O:BP 116/46  Pulse 90  Temp 99.1 F (37.3 C) (Oral)  Resp 16  Ht 5\' 1"  (1.549 m)  Wt 66.9 kg (147 lb 7.8 oz)  BMI 27.87 kg/m2  SpO2 98%  Intake/Output Summary (Last 24 hours) at 06/15/12 0820 Last data filed at 06/15/12 0700  Gross per 24 hour  Intake 2436.65 ml  Output   2100 ml  Net 336.65 ml   Weight change: 5.44 kg (11 lb 15.9 oz) [id number] and alert CVS:RRR Resp:decreased BS bases Abd:+BS NTND Ext:no edema + bruit Lt UA AVF NEURO:moves all extremities, ox3       . acetaminophen  1,000 mg Oral Q6H   Or  . acetaminophen (TYLENOL) oral liquid 160 mg/5 mL  975 mg Per Tube Q6H  . amLODipine  10 mg Oral Daily  . aspirin EC  81 mg Oral Daily  . atorvastatin  10 mg Oral q1800  . bisacodyl  10 mg Oral Daily   Or  . bisacodyl  10 mg Rectal Daily  . cloNIDine  0.2 mg Oral BID  . darbepoetin (ARANESP) injection - DIALYSIS  100 mcg Intravenous Q Fri-HD  . docusate sodium  200 mg Oral Daily  . [COMPLETED] doxercalciferol      . doxercalciferol  3 mcg Intravenous Q M,W,F-HD  . enoxaparin  30 mg Subcutaneous Q24H  . [EXPIRED] famotidine (PEPCID) IV  20 mg Intravenous Q12H  . fluticasone  2 spray Each Nare Daily  . folic acid  1 mg Oral Daily  . insulin aspart  0-24 Units Subcutaneous Q4H  . lanthanum  1,000 mg Oral TID WC  . metoprolol tartrate  12.5 mg Oral BID   Or  . metoprolol tartrate  12.5 mg Per Tube BID  . multivitamin  1 tablet Oral Daily  . pantoprazole  40 mg Oral Daily  . sodium chloride  3 mL Intravenous Q12H  . [DISCONTINUED] insulin aspart  0-24 Units Subcutaneous Q4H   Dg Chest Portable 1 View In Am  06/14/2012  *RADIOLOGY REPORT*  Clinical Data: Status post coronary artery bypass graft.  PORTABLE CHEST - 1 VIEW  Comparison: 1 day prior  Findings: Extubation.  Removal of nasogastric tube.  Mediastinal drain and left-sided chest tube remain in place.  Mild cardiomegaly.  Suspect small layering bilateral pleural effusions.  No pneumothorax.  Diminished lung volumes with new or increased mild pulmonary venous congestion.  Decreased bibasilar aeration with increased atelectasis.  IMPRESSION:  1.  Extubation, with slightly decreased aeration; increased bibasilar atelectasis and development of mild pulmonary venous congestion. 2.  Left chest tube remaining in place, without pneumothorax. 3.  Suspect trace bilateral pleural effusions.   Original Report Authenticated By: Abigail Miyamoto, M.D.    Dg Chest Portable 1 View  06/13/2012  *RADIOLOGY REPORT*  Clinical Data: Status post CABG.  PORTABLE CHEST - 1 VIEW  Comparison: 06/09/2012  Findings: Endotracheal tube tip lies approximately 3 cm above the carina.  Swan-Ganz catheter tip is in the proximal right pulmonary artery.  A left chest tube is present without pneumothorax.  Left lower lobe atelectasis present as well as scattered subsegmental atelectasis in both lungs.  No evidence of overt pulmonary edema or significant pleural effusions.  The heart size and mediastinal contours are within normal limits.  IMPRESSION: Scattered atelectasis bilaterally.  No pneumothorax identified.   Original Report Authenticated By: Aletta Edouard, M.D.    BMET    Component Value Date/Time   NA 133* 06/15/2012  0410   K 4.1 06/15/2012 0410   CL 94* 06/15/2012 0410   CO2 26 06/15/2012 0410   GLUCOSE 89 06/15/2012 0410   BUN 15 06/15/2012 0410   CREATININE 4.39* 06/15/2012 0410   CALCIUM 8.6 06/15/2012 0410   GFRNONAA 9* 06/15/2012 0410   GFRAA 11* 06/15/2012 0410   CBC    Component Value Date/Time   WBC 12.7* 06/15/2012 0410   RBC 3.65* 06/15/2012 0410   HGB 10.4* 06/15/2012 0410   HCT 33.3* 06/15/2012 0410   PLT 112* 06/15/2012 0410   MCV 91.2 06/15/2012 0410   MCH 28.5 06/15/2012 0410   MCHC 31.2 06/15/2012 0410   RDW 21.7* 06/15/2012 0410   LYMPHSABS 1.0 01/17/2012 0528   MONOABS 1.3* 01/17/2012 0528   EOSABS 0.5 01/17/2012 0528   BASOSABS 0.1 01/17/2012 0528     Assessment: 1. CAD, SP CABG 2.  Anemia, on aranesp 3. SecHPTH, on hectoral 4. ESRD  Plan: 1.  Plan HD tomorrow and then get back on MWF schedule next week 2. Recheck labs in am    Shawna [name] T

## 2012-06-15 NOTE — Progress Notes (Signed)
Patient ID: Shawna [name], female   DOB: September 10, 1940, 71 y.o.   MRN: [id number]                   [address]            [place] [address]          [phone]     2 Days Post-Op Procedure(s) (LRB): CORONARY ARTERY BYPASS GRAFTING (CABG) (N/A) INTRAOPERATIVE TRANSESOPHAGEAL ECHOCARDIOGRAM (N/A)  Total Length of Stay:  LOS: 7 days  BP 133/49  Pulse 81  Temp 98.8 F (37.1 C) (Oral)  Resp 21  Ht 5\' 1"  (1.549 m)  Wt 147 lb 7.8 oz (66.9 kg)  BMI 27.87 kg/m2  SpO2 99%  .Intake/Output      12/05 0701 - 12/06 0700 12/06 0701 - 12/07 0700   P.O. 1070 650   I.V. (mL/kg) 1460.2 (21.8) 426 (6.4)   Blood     NG/GT     IV Piggyback  2   Total Intake(mL/kg) 2530.2 (37.8) 1078 (16.1)   Urine (mL/kg/hr) 10 (0)    Emesis/NG output  1   Other 2100    Blood     Chest Tube 20    Total Output 2130 1   Net +400.2 +1077        Urine Occurrence 1 x         . sodium chloride 20 mL/hr at 06/14/12 1534  . sodium chloride 20 mL/hr at 06/14/12 1531  . sodium chloride 250 mL (06/14/12 0945)  . sodium chloride    . lactated ringers    . nitroGLYCERIN Stopped (06/15/12 0900)  . [DISCONTINUED] dexmedetomidine Stopped (06/14/12 0200)  . [DISCONTINUED] DOPamine Stopped (06/14/12 1345)  . [DISCONTINUED] nitroGLYCERIN 10 mcg/min (06/15/12 0700)  . [DISCONTINUED] phenylephrine (NEO-SYNEPHRINE) Adult infusion Stopped (06/13/12 1637)     Lab Results  Component Value Date   WBC 12.7* 06/15/2012   HGB 10.4* 06/15/2012   HCT 33.3* 06/15/2012   PLT 112* 06/15/2012   GLUCOSE 89 06/15/2012   CHOL 210* 12/16/2011   TRIG 133 01/12/2012   HDL 83 12/16/2011   LDLCALC 114* 12/16/2011   ALT 11 06/12/2012   AST 15 06/12/2012   NA 133* 06/15/2012   K 4.1 06/15/2012   CL 94* 06/15/2012   CREATININE 4.39* 06/15/2012   BUN 15 06/15/2012   CO2 26 06/15/2012   INR 1.34 06/13/2012   HGBA1C 4.8 06/12/2012   Some nausea better now  Grace Isaac MD  Beeper [phone] Office [phone] 06/15/2012 6:52  PM

## 2012-06-16 ENCOUNTER — Inpatient Hospital Stay (HOSPITAL_COMMUNITY): Payer: Medicare Other

## 2012-06-16 LAB — TYPE AND SCREEN
ABO/RH(D): O POS
Antibody Screen: NEGATIVE
Donor AG Type: NEGATIVE
Donor AG Type: NEGATIVE
Donor AG Type: NEGATIVE
Donor AG Type: NEGATIVE
Donor AG Type: NEGATIVE
Donor AG Type: NEGATIVE
Unit division: 0
Unit division: 0
Unit division: 0
Unit division: 0
Unit division: 0
Unit division: 0

## 2012-06-16 LAB — RENAL FUNCTION PANEL
Albumin: 1.9 g/dL — ABNORMAL LOW (ref 3.5–5.2)
BUN: 30 mg/dL — ABNORMAL HIGH (ref 6–23)
Chloride: 90 mEq/L — ABNORMAL LOW (ref 96–112)
GFR calc non Af Amer: 6 mL/min — ABNORMAL LOW (ref >90)
Phosphorus: 6.8 mg/dL — ABNORMAL HIGH (ref 2.3–4.6)
Potassium: 4.6 mEq/L (ref 3.5–5.1)
Sodium: 127 mEq/L — ABNORMAL LOW (ref 135–145)

## 2012-06-16 LAB — CBC
HCT: 30.2 % — ABNORMAL LOW (ref 36.0–46.0)
Hemoglobin: 9.6 g/dL — ABNORMAL LOW (ref 12.0–15.0)
MCH: 28.7 pg (ref 26.0–34.0)
MCHC: 31.8 g/dL (ref 30.0–36.0)
MCV: 90.4 fL (ref 78.0–100.0)
Platelets: 128 10*3/uL — ABNORMAL LOW (ref 150–400)
RBC: 3.34 MIL/uL — ABNORMAL LOW (ref 3.87–5.11)
RDW: 20.6 % — ABNORMAL HIGH (ref 11.5–15.5)
WBC: 9.1 10*3/uL (ref 4.0–10.5)

## 2012-06-16 LAB — GLUCOSE, CAPILLARY
Glucose-Capillary: 110 mg/dL — ABNORMAL HIGH (ref 70–99)
Glucose-Capillary: 63 mg/dL — ABNORMAL LOW (ref 70–99)
Glucose-Capillary: 86 mg/dL (ref 70–99)
Glucose-Capillary: 168 mg/dL — ABNORMAL HIGH (ref 70–99)
Glucose-Capillary: 115 mg/dL — ABNORMAL HIGH (ref 70–99)

## 2012-06-16 IMAGING — CR DG CHEST 1V PORT
1 series · 1 of 1 positions shown · non-contrast
Comparison: [DATE].

CLINICAL DATA: Bypass surgery.

PORTABLE CHEST - 1 VIEW

[AP]
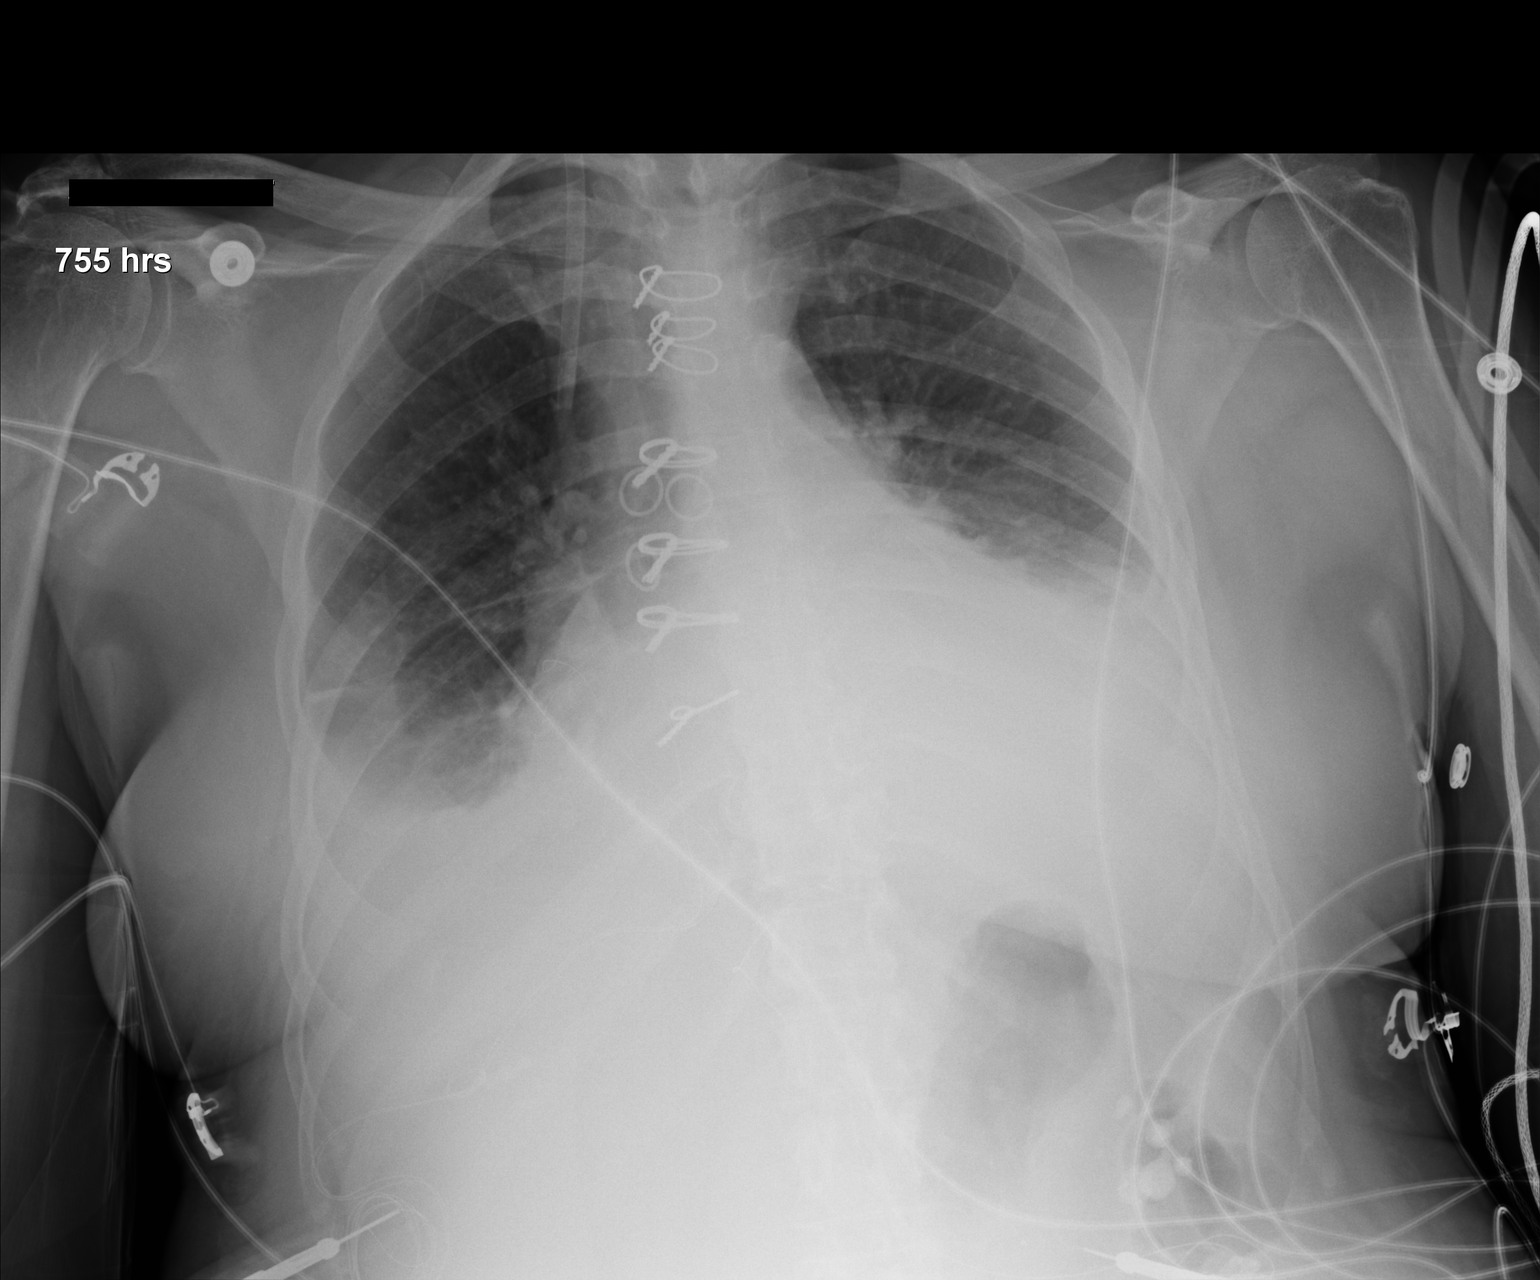

[1 of 1 positions shown; findings below may reference images not displayed]

FINDINGS: The right IJ Cordis is in stable position.  The heart
remains enlarged.  Persistent bilateral pleural effusions,
bibasilar atelectasis and mild central vascular congestion.
IMPRESSION: Stable chest x-ray findings.

## 2012-06-16 MED ORDER — ASPIRIN 81 MG PO CHEW
CHEWABLE_TABLET | ORAL | Status: AC
  Filled 2012-06-16: qty 1

## 2012-06-16 MED ORDER — PENTAFLUOROPROP-TETRAFLUOROETH EX AERO
INHALATION_SPRAY | CUTANEOUS | Status: AC
  Filled 2012-06-16: qty 103.5

## 2012-06-16 MED ORDER — DOXERCALCIFEROL 4 MCG/2ML IV SOLN
INTRAVENOUS | Status: AC
  Administered 2012-06-16: 4 ug via INTRAVENOUS
  Filled 2012-06-16: qty 2

## 2012-06-16 MED ORDER — PARICALCITOL 5 MCG/ML IV SOLN
INTRAVENOUS | Status: AC
  Filled 2012-06-16: qty 1

## 2012-06-16 MED ORDER — DARBEPOETIN ALFA-POLYSORBATE 100 MCG/0.5ML IJ SOLN
INTRAMUSCULAR | Status: AC
  Administered 2012-06-16: 100 ug via INTRAVENOUS
  Filled 2012-06-16: qty 0.5

## 2012-06-16 NOTE — Progress Notes (Signed)
Hypoglycemic Event  CBG:63  Treatment: graham crackers and gingerale  Symptoms: tired, hungry  Follow-up CBG: Time:15 minutes later CBG Result:86  Possible Reasons for Event: Patient had bedside dialysis this morning, did not want to eat breakfast while getting dialyzed.        Shawna [name] Shawna [name]  Remember to initiate Hypoglycemia Order Set & complete

## 2012-06-16 NOTE — Progress Notes (Signed)
Patient ID: Shawna [name], female   DOB: 08-22-1940, 71 y.o.   MRN: [id number]                   [name]            [name] [address]          [phone]     3 Days Post-Op Procedure(s) (LRB): CORONARY ARTERY BYPASS GRAFTING (CABG) (N/A) INTRAOPERATIVE TRANSESOPHAGEAL ECHOCARDIOGRAM (N/A)  Total Length of Stay:  LOS: 8 days  BP 102/44  Pulse 79  Temp 98.6 F (37 C) (Oral)  Resp 24  Ht 5\' 1"  (1.549 m)  Wt 146 lb 9.7 oz (66.5 kg)  BMI 27.70 kg/m2  SpO2 100%  .Intake/Output      12/07 0701 - 12/08 0700   P.O. 240   I.V. (mL/kg) 240 (3.6)   IV Piggyback    Total Intake(mL/kg) 480 (7.2)   Emesis/NG output    Other 3000   Total Output 3000   Net -2520            . sodium chloride 20 mL/hr at 06/14/12 1534  . sodium chloride 20 mL/hr at 06/14/12 1531  . sodium chloride 250 mL (06/14/12 0945)  . sodium chloride    . lactated ringers    . nitroGLYCERIN Stopped (06/15/12 0900)     Lab Results  Component Value Date   WBC 9.1 06/16/2012   HGB 9.6* 06/16/2012   HCT 30.2* 06/16/2012   PLT 128* 06/16/2012   GLUCOSE 77 06/16/2012   CHOL 210* 12/16/2011   TRIG 133 01/12/2012   HDL 83 12/16/2011   LDLCALC 114* 12/16/2011   ALT 11 06/12/2012   AST 15 06/12/2012   NA 127* 06/16/2012   K 4.6 06/16/2012   CL 90* 06/16/2012   CREATININE 6.44* 06/16/2012   BUN 30* 06/16/2012   CO2 24 06/16/2012   INR 1.34 06/13/2012   HGBA1C 4.8 06/12/2012   Stable day, tolerated dialysis well Off brilinta currently, has one stent of diagonal open and not grafted   Grace Isaac MD  Beeper [phone] Office [phone] 06/16/2012 7:17 PM

## 2012-06-16 NOTE — Progress Notes (Signed)
Patient ID: Shawna [name], female   DOB: [birth year], 71 y.o.   MRN: [id number] TCTS DAILY PROGRESS NOTE                   [address]            [place] [address]          [phone]      3 Days Post-Op Procedure(s) (LRB): CORONARY ARTERY BYPASS GRAFTING (CABG) (N/A) INTRAOPERATIVE TRANSESOPHAGEAL ECHOCARDIOGRAM (N/A)  Total Length of Stay:  LOS: 8 days   Subjective: Feels better today currently on dialysis  Objective: Vital signs in last 24 hours: Temp:  [98.5 F (36.9 C)-99 F (37.2 C)] 98.5 F (36.9 C) (12/07 0814) Pulse Rate:  [77-104] 86  (12/07 0845) Cardiac Rhythm:  [-] Normal sinus rhythm (12/07 0815) Resp:  [16-26] 18  (12/07 0845) BP: (103-148)/(40-88) 113/45 mmHg (12/07 0845) SpO2:  [92 %-100 %] 100 % (12/07 0845) Weight:  [152 lb 5.4 oz (69.1 kg)] 152 lb 5.4 oz (69.1 kg) (12/07 0710)  Filed Weights   06/15/12 0500 06/16/12 0600 06/16/12 0710  Weight: 147 lb 7.8 oz (66.9 kg) 152 lb 5.4 oz (69.1 kg) 152 lb 5.4 oz (69.1 kg)    Weight change: 2 lb 5.4 oz (1.06 kg)   Hemodynamic parameters for last 24 hours:    Intake/Output from previous day: 12/06 0701 - 12/07 0700 In: 1648 [P.O.:950; I.V.:696; IV Piggyback:2] Out: 1 [Emesis/NG output:1]  Intake/Output this shift: Total I/O In: 20 [I.V.:20] Out: -   Current Meds: Scheduled Meds:   . acetaminophen  1,000 mg Oral Q6H   Or  . acetaminophen (TYLENOL) oral liquid 160 mg/5 mL  975 mg Per Tube Q6H  . amLODipine  10 mg Oral Daily  . [COMPLETED] aspirin      . aspirin EC  81 mg Oral Daily  . atorvastatin  10 mg Oral q1800  . bisacodyl  10 mg Oral Daily   Or  . bisacodyl  10 mg Rectal Daily  . cloNIDine  0.2 mg Oral BID  . darbepoetin      . darbepoetin (ARANESP) injection - DIALYSIS  100 mcg Intravenous Q Fri-HD  . docusate sodium  200 mg Oral Daily  . doxercalciferol      . doxercalciferol  3 mcg Intravenous Q M,W,F-HD  . enoxaparin  30 mg Subcutaneous Q24H  . fluticasone  2  spray Each Nare Daily  . folic acid  1 mg Oral Daily  . insulin aspart  0-24 Units Subcutaneous TID PC & HS  . lanthanum  1,000 mg Oral TID WC  . metoprolol tartrate  12.5 mg Oral BID   Or  . metoprolol tartrate  12.5 mg Per Tube BID  . multivitamin  1 tablet Oral Daily  . pantoprazole  40 mg Oral Daily  . paricalcitol      . pentafluoroprop-tetrafluoroeth      . sodium chloride  10-40 mL Intracatheter Q12H  . sodium chloride  3 mL Intravenous Q12H  . [DISCONTINUED] darbepoetin (ARANESP) injection - DIALYSIS  100 mcg Intravenous Q Fri-HD  . [DISCONTINUED] insulin aspart  0-24 Units Subcutaneous Q4H   Continuous Infusions:   . sodium chloride 20 mL/hr at 06/14/12 1534  . sodium chloride 20 mL/hr at 06/14/12 1531  . sodium chloride 250 mL (06/14/12 0945)  . sodium chloride    . lactated ringers    . nitroGLYCERIN Stopped (06/15/12 0900)   PRN Meds:.calcium carbonate (dosed  in mg elemental calcium), camphor-menthol, feeding supplement (NEPRO CARB STEADY), hydrOXYzine, loratadine, metoprolol, ondansetron (ZOFRAN) IV, sodium chloride, sodium chloride, traMADol  General appearance: alert and cooperative Neurologic: Neuro intact Heart: Regular rate and rhythm without murmur of mitral insufficiency Lungs: Lungs clear bilaterally Abdomen: Abdominal exam without palpable tenderness or distention Extremities: No evidence of DVT vein harvest sites healing well right groin hematoma is stable} Wound: Sternum is stable   Lab Results: CBC: Basename 06/16/12 0500 06/15/12 0410  WBC 9.1 12.7*  HGB 9.6* 10.4*  HCT 30.2* 33.3*  PLT 128* 112*   BMET:  Basename 06/16/12 0428 06/15/12 0410  NA 127* 133*  K 4.6 4.1  CL 90* 94*  CO2 24 26  GLUCOSE 77 89  BUN 30* 15  CREATININE 6.44* 4.39*  CALCIUM 8.1* 8.6    PT/INR:  Basename 06/13/12 1421  LABPROT 16.3*  INR 1.34   Radiology: Dg Chest Port 1 View  06/16/2012  *RADIOLOGY REPORT*  Clinical Data: Bypass surgery.  PORTABLE CHEST - 1  VIEW  Comparison: 06/15/2012.  Findings: The right IJ Cordis is in stable position.  The heart remains enlarged.  Persistent bilateral pleural effusions, bibasilar atelectasis and mild central vascular congestion.  IMPRESSION: Stable chest x-ray findings.   Original Report Authenticated By: Marijo Sanes, M.D.    Dg Chest Portable 1 View In Am  06/15/2012  *RADIOLOGY REPORT*  Clinical Data: Postop CABG  PORTABLE CHEST - 1 VIEW  Comparison: 06/14/2012; 06/13/2012  Findings: Grossly unchanged enlarged cardiac silhouette and mediastinal contours post median sternotomy and CABG.  Interval removal of right jugular approach PA catheter with remaining vascular sheath tip projected over the mid SVC.  Interval removal of left-sided chest tube and mediastinal drain.  No definite pneumothorax.  Small bilateral effusions and bibasilar heterogeneous opacities are unchanged.  Mild pulmonary venous congestion without frank evidence of edema.  Unchanged bones.  IMPRESSION: 1.  Interval removal of support apparatus as above.  No pneumothorax. 2.  Pulmonary congestion without frank evidence of edema. 3.  Grossly unchanged small bilateral effusions and bibasilar opacities, likely atelectasis.   Original Report Authenticated By: Jake Seats, MD      Assessment/Plan: S/P Procedure(s) (LRB): CORONARY ARTERY BYPASS GRAFTING (CABG) (N/A) INTRAOPERATIVE TRANSESOPHAGEAL ECHOCARDIOGRAM (N/A) Mobilize Complete dialysis today and consider transfer to step down in a.m. Mild hyponatremia Hemoglobin and hematocrit stable to stop  Charmin Aguiniga B 06/16/2012 9:06 AM

## 2012-06-16 NOTE — Procedures (Signed)
Pt seen on HD Ap 120 Vp 140  SBP 113 DW 61 kg,  Weighs 69 kg now.  Try for 3 liters if BP permits.  May need to HD 2-3 days in a row next week to get back to DW.

## 2012-06-17 ENCOUNTER — Inpatient Hospital Stay (HOSPITAL_COMMUNITY): Payer: Medicare Other

## 2012-06-17 LAB — GLUCOSE, CAPILLARY
Glucose-Capillary: 103 mg/dL — ABNORMAL HIGH (ref 70–99)
Glucose-Capillary: 135 mg/dL — ABNORMAL HIGH (ref 70–99)

## 2012-06-17 LAB — CBC
HCT: 30.5 % — ABNORMAL LOW (ref 36.0–46.0)
Hemoglobin: 9.8 g/dL — ABNORMAL LOW (ref 12.0–15.0)
MCH: 29.4 pg (ref 26.0–34.0)
MCHC: 32.1 g/dL (ref 30.0–36.0)
MCV: 91.6 fL (ref 78.0–100.0)
Platelets: 169 10*3/uL (ref 150–400)
RBC: 3.33 MIL/uL — ABNORMAL LOW (ref 3.87–5.11)
RDW: 20.3 % — ABNORMAL HIGH (ref 11.5–15.5)
WBC: 7.8 10*3/uL (ref 4.0–10.5)

## 2012-06-17 LAB — BASIC METABOLIC PANEL
BUN: 29 mg/dL — ABNORMAL HIGH (ref 6–23)
CO2: 27 mEq/L (ref 19–32)
Calcium: 8.2 mg/dL — ABNORMAL LOW (ref 8.4–10.5)
Chloride: 95 mEq/L — ABNORMAL LOW (ref 96–112)
Creatinine, Ser: 4.97 mg/dL — ABNORMAL HIGH (ref 0.50–1.10)
GFR calc Af Amer: 9 mL/min — ABNORMAL LOW (ref >90)
GFR calc non Af Amer: 8 mL/min — ABNORMAL LOW (ref >90)
Glucose, Bld: 105 mg/dL — ABNORMAL HIGH (ref 70–99)
Potassium: 3.5 mEq/L (ref 3.5–5.1)
Sodium: 132 mEq/L — ABNORMAL LOW (ref 135–145)

## 2012-06-17 IMAGING — CR DG CHEST 1V PORT
1 series · 1 of 1 positions shown · non-contrast
Comparison: [DATE]

CLINICAL DATA: Short of breath

PORTABLE CHEST - 1 VIEW

[AP]
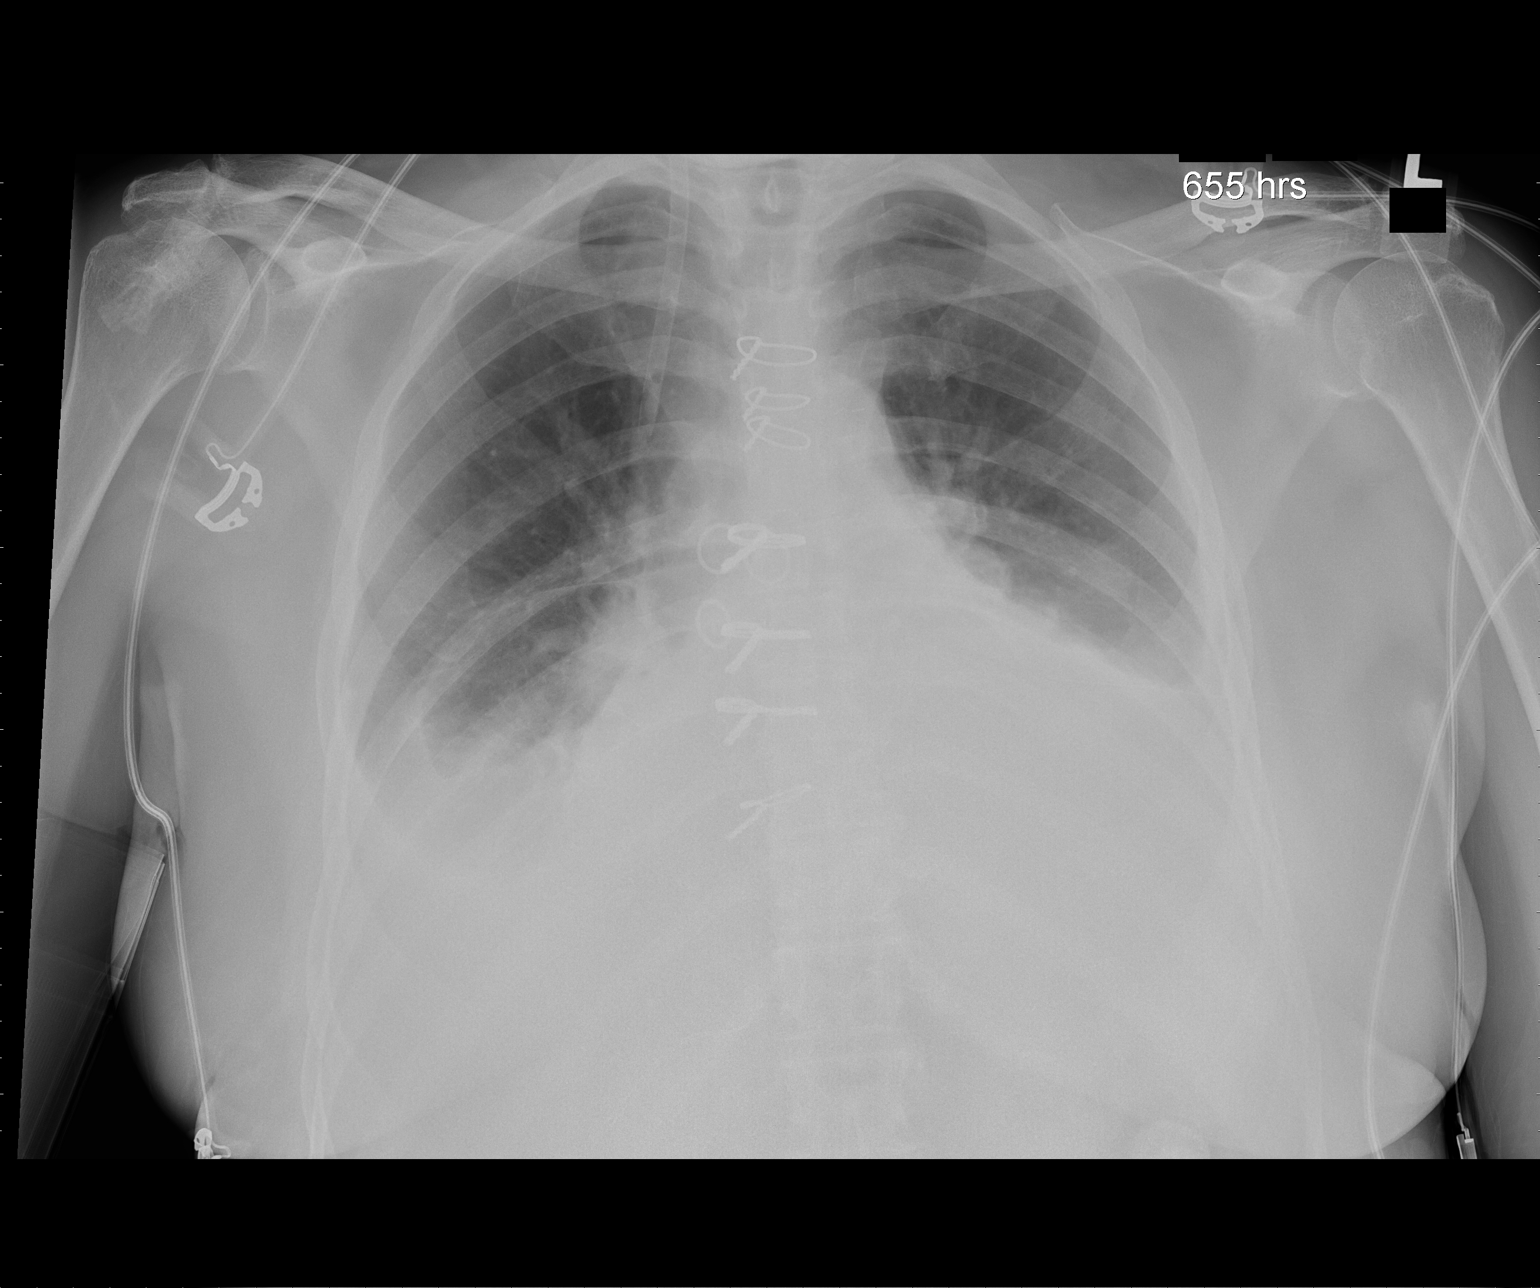

[1 of 1 positions shown; findings below may reference images not displayed]

FINDINGS: Moderate bilateral effusions with associated atelectasis
are stable.  No evidence of pulmonary edema.  No pneumothorax.

Changes from cardiac surgery are stable.  The cardiac silhouette is
mildly enlarged.

Right internal jugular Cordis remains well positioned.
IMPRESSION: No change from previous day's study.

## 2012-06-17 MED ORDER — MOVING RIGHT ALONG BOOK
Freq: Once | Status: AC
  Administered 2012-06-17: 10:00:00
  Filled 2012-06-17: qty 1

## 2012-06-17 MED ORDER — SODIUM CHLORIDE 0.9 % IJ SOLN
3.0000 mL | Freq: Two times a day (BID) | INTRAMUSCULAR | Status: DC
  Administered 2012-06-17 – 2012-06-19 (×5): 3 mL via INTRAVENOUS

## 2012-06-17 MED ORDER — PANTOPRAZOLE SODIUM 40 MG PO TBEC
40.0000 mg | DELAYED_RELEASE_TABLET | Freq: Every day | ORAL | Status: DC
  Administered 2012-06-19: 40 mg via ORAL
  Filled 2012-06-17 (×3): qty 1

## 2012-06-17 MED ORDER — TICAGRELOR 90 MG PO TABS
90.0000 mg | ORAL_TABLET | Freq: Two times a day (BID) | ORAL | Status: DC
  Filled 2012-06-17 (×8): qty 1

## 2012-06-17 MED ORDER — SODIUM CHLORIDE 0.9 % IV SOLN: 250.0000 mL | INTRAVENOUS | Status: DC | PRN

## 2012-06-17 MED ORDER — SODIUM CHLORIDE 0.9 % IJ SOLN: 3.0000 mL | INTRAMUSCULAR | Status: DC | PRN

## 2012-06-17 MED ORDER — BISACODYL 10 MG RE SUPP
10.0000 mg | Freq: Once | RECTAL | Status: AC
  Administered 2012-06-17: 10 mg via RECTAL
  Filled 2012-06-17: qty 1

## 2012-06-17 NOTE — Progress Notes (Signed)
Report called to RN and met at bedside.  Transferred patient from 2314 to 2017 and attached to tele monitor without complication.

## 2012-06-17 NOTE — Evaluation (Signed)
Physical Therapy Evaluation Patient Details Name: Shawna [name] MRN: [id number] DOB: January 06, 1941 Today's Date: 06/17/2012 Time: [id number] PT Time Calculation (min): 17 min  PT Assessment / Plan / Recommendation Clinical Impression  Patient is a 71 yo female s/p CABG, with h/o ESRD on HD.  Patient did very well with mobility today.  Good understanding of sternal precautions.  Patient will benefit from acute PT to maximize independence prior to discharge home with husband.  Recommend HHPT at discharge.    PT Assessment  Patient needs continued PT services    Follow Up Recommendations  Home health PT;Supervision/Assistance - 24 hour    Does the patient have the potential to tolerate intense rehabilitation      Barriers to Discharge None      Equipment Recommendations  Other (comment) (TBD - may progress to point of not needing RW)    Recommendations for Other Services     Frequency Min 3X/week    Precautions / Restrictions Precautions Precautions: Sternal Restrictions Weight Bearing Restrictions: No   Pertinent Vitals/Pain       Mobility  Bed Mobility Bed Mobility: Supine to Sit;Sitting - Scoot to Edge of Bed Supine to Sit: HOB flat;3: Mod assist Sitting - Scoot to Edge of Bed: 4: Min guard Details for Bed Mobility Assistance: Verbal cues for technique and to maintain sternal precautions.  Assist to raise trunk off bed. Transfers Transfers: Sit to Stand;Stand to Sit Sit to Stand: 4: Min assist;Without upper extremity assist;From bed Stand to Sit: 4: Min assist;Without upper extremity assist;To chair/3-in-1 Details for Transfer Assistance: Verbal cues for hand placement and precautions.  Minimal assist to lift trunk off bed to stand. Ambulation/Gait Ambulation/Gait Assistance: 5: Supervision Ambulation Distance (Feet): 220 Feet Assistive device: Rolling walker Ambulation/Gait Assistance Details: Verbal cues to stand tall during gait, and to minimize weight through UE's -  use RW for balance only. Gait Pattern: Step-through pattern;Decreased stride length Gait velocity: Slow gait speed.           PT Diagnosis: Difficulty walking;Generalized weakness;Acute pain  PT Problem List: Decreased strength;Decreased activity tolerance;Decreased balance;Decreased mobility;Decreased knowledge of use of DME;Decreased knowledge of precautions;Cardiopulmonary status limiting activity;Pain PT Treatment Interventions: DME instruction;Gait training;Functional mobility training;Patient/family education   PT Goals Acute Rehab PT Goals PT Goal Formulation: With patient Time For Goal Achievement: 06/24/12 Potential to Achieve Goals: Good Pt will go Supine/Side to Sit: with supervision;with HOB 0 degrees PT Goal: Supine/Side to Sit - Progress: Goal set today Pt will go Sit to Supine/Side: with supervision;with HOB 0 degrees PT Goal: Sit to Supine/Side - Progress: Goal set today Pt will go Sit to Stand: with supervision;without upper extremity assist PT Goal: Sit to Stand - Progress: Goal set today Pt will Ambulate: >150 feet;with supervision;with least restrictive assistive device PT Goal: Ambulate - Progress: Goal set today  Visit Information  Last PT Received On: 06/17/12 Assistance Needed: +1    Subjective Data  Subjective: "I'm so tired" Patient Stated Goal: To go home   Prior [place] Lives With: Spouse Available Help at Discharge: Family;Available 24 hours/day Type of Home: House Home Access: Stairs to enter [employer] of Steps: 1 Entrance Stairs-Rails: None Home Layout: One level Bathroom Shower/Tub: Chiropodist: Standard Home Adaptive Equipment: None Additional Comments: 2 daughters assist patient as well as husband Prior Function Level of Independence: Independent Able to Take Stairs?: Yes Driving: Yes Vocation: Retired Corporate investment banker: No difficulties    Cognition  Overall Cognitive  Status:  Appears within functional limits for tasks assessed/performed Arousal/Alertness: Awake/alert Orientation Level: Appears intact for tasks assessed Behavior During Session: [facility] for tasks performed    Extremity/Trunk Assessment Right Upper Extremity Assessment RUE ROM/Strength/Tone: [facility] for tasks assessed Left Upper Extremity Assessment LUE ROM/Strength/Tone: [facility] for tasks assessed Right Lower Extremity Assessment RLE ROM/Strength/Tone: WFL for tasks assessed RLE Sensation: WFL - Light Touch Left Lower Extremity Assessment LLE ROM/Strength/Tone: WFL for tasks assessed LLE Sensation: WFL - Light Touch   Balance    End of Session PT - End of Session Activity Tolerance: Patient limited by fatigue Patient left: in chair;with call bell/phone within reach Nurse Communication: Mobility status  GP     Despina Pole 06/17/2012, 5:47 PM Carita Pian. Sanjuana Kava, [place] Pager [phone]

## 2012-06-17 NOTE — Progress Notes (Signed)
S: sitting in chair O:BP 102/41  Pulse 72  Temp 97.4 F (36.3 C) (Oral)  Resp 18  Ht 5\' 1"  (1.549 m)  Wt 67.6 kg (149 lb 0.5 oz)  BMI 28.16 kg/m2  SpO2 100%  Intake/Output Summary (Last 24 hours) at 06/17/12 0831 Last data filed at 06/17/12 0800  Gross per 24 hour  Intake    640 ml  Output   3000 ml  Net  -2360 ml   Weight change: 0 kg (0 lb) [id number] and alert CVS:RRR Resp:decreased BS bases Abd:+BS NTND Ext:no edema + bruit Lt UA AVF NEURO:moves all extremities, ox3       . acetaminophen  1,000 mg Oral Q6H   Or  . acetaminophen (TYLENOL) oral liquid 160 mg/5 mL  975 mg Per Tube Q6H  . amLODipine  10 mg Oral Daily  . aspirin EC  81 mg Oral Daily  . atorvastatin  10 mg Oral q1800  . bisacodyl  10 mg Oral Daily   Or  . bisacodyl  10 mg Rectal Daily  . cloNIDine  0.2 mg Oral BID  . darbepoetin (ARANESP) injection - DIALYSIS  100 mcg Intravenous Q Fri-HD  . docusate sodium  200 mg Oral Daily  . doxercalciferol  3 mcg Intravenous Q M,W,F-HD  . enoxaparin  30 mg Subcutaneous Q24H  . fluticasone  2 spray Each Nare Daily  . folic acid  1 mg Oral Daily  . insulin aspart  0-24 Units Subcutaneous TID PC & HS  . lanthanum  1,000 mg Oral TID WC  . metoprolol tartrate  12.5 mg Oral BID   Or  . metoprolol tartrate  12.5 mg Per Tube BID  . multivitamin  1 tablet Oral Daily  . pantoprazole  40 mg Oral Daily  . [EXPIRED] paricalcitol      . [EXPIRED] pentafluoroprop-tetrafluoroeth      . sodium chloride  10-40 mL Intracatheter Q12H  . sodium chloride  3 mL Intravenous Q12H   Dg Chest Port 1 View  06/16/2012  *RADIOLOGY REPORT*  Clinical Data: Bypass surgery.  PORTABLE CHEST - 1 VIEW  Comparison: 06/15/2012.  Findings: The right IJ Cordis is in stable position.  The heart remains enlarged.  Persistent bilateral pleural effusions, bibasilar atelectasis and mild central vascular congestion.  IMPRESSION: Stable chest x-ray findings.   Original Report Authenticated By: Marijo Sanes, M.D.    BMET    Component Value Date/Time   NA 132* 06/17/2012 0408   K 3.5 06/17/2012 0408   CL 95* 06/17/2012 0408   CO2 27 06/17/2012 0408   GLUCOSE 105* 06/17/2012 0408   BUN 29* 06/17/2012 0408   CREATININE 4.97* 06/17/2012 0408   CALCIUM 8.2* 06/17/2012 0408   GFRNONAA 8* 06/17/2012 0408   GFRAA 9* 06/17/2012 0408   CBC    Component Value Date/Time   WBC 7.8 06/17/2012 0408   RBC 3.33* 06/17/2012 0408   HGB 9.8* 06/17/2012 0408   HCT 30.5* 06/17/2012 0408   PLT 169 06/17/2012 0408   MCV 91.6 06/17/2012 0408   MCH 29.4 06/17/2012 0408   MCHC 32.1 06/17/2012 0408   RDW 20.3* 06/17/2012 0408   LYMPHSABS 1.0 01/17/2012 0528   MONOABS 1.3* 01/17/2012 0528   EOSABS 0.5 01/17/2012 0528   BASOSABS 0.1 01/17/2012 0528     Assessment: 1. CAD, SP CABG 2. Anemia, on aranesp 3. SecHPTH, on hectoral 4. ESRD 5. BP lowish, limiting factor in fluid removal  Plan: 1.  Will amlodipine and cont  clonidine only for BP >120 2. Plan HD in AM  Shawna [name] T

## 2012-06-17 NOTE — Progress Notes (Signed)
Patient ID: Shawna [name], female   DOB: 1940/09/08, 71 y.o.   MRN: [id number] TCTS DAILY PROGRESS NOTE                   [address]            [place] [address]          [phone]      4 Days Post-Op Procedure(s) (LRB): CORONARY ARTERY BYPASS GRAFTING (CABG) (N/A) INTRAOPERATIVE TRANSESOPHAGEAL ECHOCARDIOGRAM (N/A)  Total Length of Stay:  LOS: 9 days   Subjective: Feels well, walking in unit  Objective: Vital signs in last 24 hours: Temp:  [97.4 F (36.3 C)-98.8 F (37.1 C)] 97.4 F (36.3 C) (12/08 0740) Pulse Rate:  [72-103] 74  (12/08 0900) Cardiac Rhythm:  [-] Normal sinus rhythm (12/08 0800) Resp:  [15-25] 15  (12/08 0900) BP: (91-151)/(37-76) 115/46 mmHg (12/08 0900) SpO2:  [91 %-100 %] 100 % (12/08 0900) Weight:  [146 lb 9.7 oz (66.5 kg)-149 lb 0.5 oz (67.6 kg)] 149 lb 0.5 oz (67.6 kg) (12/08 0500)  Filed Weights   06/16/12 0710 06/16/12 1136 06/17/12 0500  Weight: 152 lb 5.4 oz (69.1 kg) 146 lb 9.7 oz (66.5 kg) 149 lb 0.5 oz (67.6 kg)    Weight change: 0 lb (0 kg)   Hemodynamic parameters for last 24 hours:    Intake/Output from previous day: 12/07 0701 - 12/08 0700 In: 660 [P.O.:360; I.V.:300] Out: 3000   Intake/Output this shift:    Current Meds: Scheduled Meds:   . acetaminophen  1,000 mg Oral Q6H   Or  . acetaminophen (TYLENOL) oral liquid 160 mg/5 mL  975 mg Per Tube Q6H  . aspirin EC  81 mg Oral Daily  . atorvastatin  10 mg Oral q1800  . bisacodyl  10 mg Oral Daily   Or  . bisacodyl  10 mg Rectal Daily  . cloNIDine  0.2 mg Oral BID  . darbepoetin (ARANESP) injection - DIALYSIS  100 mcg Intravenous Q Fri-HD  . docusate sodium  200 mg Oral Daily  . doxercalciferol  3 mcg Intravenous Q M,W,F-HD  . enoxaparin  30 mg Subcutaneous Q24H  . fluticasone  2 spray Each Nare Daily  . folic acid  1 mg Oral Daily  . insulin aspart  0-24 Units Subcutaneous TID PC & HS  . lanthanum  1,000 mg Oral TID WC  . metoprolol tartrate   12.5 mg Oral BID   Or  . metoprolol tartrate  12.5 mg Per Tube BID  . multivitamin  1 tablet Oral Daily  . pantoprazole  40 mg Oral Daily  . [EXPIRED] paricalcitol      . [EXPIRED] pentafluoroprop-tetrafluoroeth      . sodium chloride  10-40 mL Intracatheter Q12H  . sodium chloride  3 mL Intravenous Q12H  . [DISCONTINUED] amLODipine  10 mg Oral Daily   Continuous Infusions:   . sodium chloride 20 mL/hr at 06/14/12 1534  . sodium chloride 20 mL/hr at 06/14/12 1531  . sodium chloride Stopped (06/16/12 2200)  . sodium chloride    . lactated ringers    . nitroGLYCERIN Stopped (06/15/12 0900)   PRN Meds:.calcium carbonate (dosed in mg elemental calcium), camphor-menthol, feeding supplement (NEPRO CARB STEADY), hydrOXYzine, loratadine, metoprolol, ondansetron (ZOFRAN) IV, sodium chloride, sodium chloride, traMADol  General appearance: alert and cooperative Neurologic: intact Heart: regular rate and rhythm, S1, S2 normal, no murmur, click, rub or gallop Lungs: diminished breath sounds bibasilar Abdomen: soft, non-tender;  bowel sounds normal; no masses,  no organomegaly Extremities: extremities normal, atraumatic, no cyanosis or edema and Homans sign is negative, no sign of DVT Wound: sternum stable  Lab Results: CBC: Basename 06/17/12 0408 06/16/12 0500  WBC 7.8 9.1  HGB 9.8* 9.6*  HCT 30.5* 30.2*  PLT 169 128*   BMET:  Basename 06/17/12 0408 06/16/12 0428  NA 132* 127*  K 3.5 4.6  CL 95* 90*  CO2 27 24  GLUCOSE 105* 77  BUN 29* 30*  CREATININE 4.97* 6.44*  CALCIUM 8.2* 8.1*    PT/INR: No results found for this basename: LABPROT,INR in the last 72 hours Radiology: Dg Chest Port 1 View  06/16/2012  *RADIOLOGY REPORT*  Clinical Data: Bypass surgery.  PORTABLE CHEST - 1 VIEW  Comparison: 06/15/2012.  Findings: The right IJ Cordis is in stable position.  The heart remains enlarged.  Persistent bilateral pleural effusions, bibasilar atelectasis and mild central vascular  congestion.  IMPRESSION: Stable chest x-ray findings.   Original Report Authenticated By: Marijo Sanes, M.D.      Assessment/Plan: S/P Procedure(s) (LRB): CORONARY ARTERY BYPASS GRAFTING (CABG) (N/A) INTRAOPERATIVE TRANSESOPHAGEAL ECHOCARDIOGRAM (N/A) Mobilize d/c tubes/lines Plan for transfer to step-down: see transfer orders still with some bil pleural effusions, try to get to dry wt with dialysis     Bensen Chadderdon B 06/17/2012 9:18 AM

## 2012-06-17 NOTE — Progress Notes (Signed)
Patient IV out of date at midnight, but refusing to have a new IV started, IV flushed and redressed by IV team. Will monitor IV site.

## 2012-06-17 NOTE — Progress Notes (Signed)
Patient refusing Brilinta until she can talk to MD in morning.

## 2012-06-18 LAB — CBC
HCT: 32.5 % — ABNORMAL LOW (ref 36.0–46.0)
Hemoglobin: 10.5 g/dL — ABNORMAL LOW (ref 12.0–15.0)
MCH: 28.8 pg (ref 26.0–34.0)
MCHC: 32.3 g/dL (ref 30.0–36.0)
MCV: 89.3 fL (ref 78.0–100.0)
Platelets: 272 10*3/uL (ref 150–400)
RBC: 3.64 MIL/uL — ABNORMAL LOW (ref 3.87–5.11)
RDW: 19.7 % — ABNORMAL HIGH (ref 11.5–15.5)
WBC: 7.5 10*3/uL (ref 4.0–10.5)

## 2012-06-18 LAB — RENAL FUNCTION PANEL
Albumin: 2.1 g/dL — ABNORMAL LOW (ref 3.5–5.2)
BUN: 48 mg/dL — ABNORMAL HIGH (ref 6–23)
CO2: 26 mEq/L (ref 19–32)
Calcium: 9 mg/dL (ref 8.4–10.5)
Chloride: 92 mEq/L — ABNORMAL LOW (ref 96–112)
Creatinine, Ser: 7.16 mg/dL — ABNORMAL HIGH (ref 0.50–1.10)
GFR calc Af Amer: 6 mL/min — ABNORMAL LOW (ref >90)
GFR calc non Af Amer: 5 mL/min — ABNORMAL LOW (ref >90)
Glucose, Bld: 90 mg/dL (ref 70–99)
Phosphorus: 5.6 mg/dL — ABNORMAL HIGH (ref 2.3–4.6)
Potassium: 4.2 mEq/L (ref 3.5–5.1)
Sodium: 132 mEq/L — ABNORMAL LOW (ref 135–145)

## 2012-06-18 LAB — GLUCOSE, CAPILLARY: Glucose-Capillary: 84 mg/dL (ref 70–99)

## 2012-06-18 NOTE — Progress Notes (Signed)
VVS  Right femoral pseudoaneurysm. Needs repeat duplex as discharge approaches but would not repeat until near time of d/c. Moderate carotid stenosis will need follow up duplex in 6 months. I have scheduled this for her.  06/18/2012 Jacorey Donaway MAUREEN PA-C

## 2012-06-18 NOTE — Consult Note (Signed)
Assessment:  1. CAD, SP CABG  2. Anemia, on aranesp  3. SecHPTH, on hectoral  4. ESRD with volume overload 5. BP lowish, limiting factor in fluid removal  Plan:  1. Plan HD in progress.  Subjective: Interval History: Currently on HD Objective: Vital signs in last 24 hours: Temp:  [97.9 F (36.6 C)-99.4 F (37.4 C)] 98.8 F (37.1 C) (12/09 1422) Pulse Rate:  [79-96] 92  (12/09 1432) Resp:  [16-20] 16  (12/09 1432) BP: (113-156)/(48-85) 150/70 mmHg (12/09 1432) SpO2:  [93 %-100 %] 94 % (12/09 1422) Weight:  [68.493 kg (151 lb)-69.4 kg (153 lb)] 69.4 kg (153 lb) (12/09 1422) Weight change: -0.607 kg (-1 lb 5.4 oz)  Intake/Output from previous day: 12/08 0701 - 12/09 0700 In: 240 [P.O.:240] Out: 1 [Stool:1] Intake/Output this shift: Total I/O In: 120 [P.O.:120] Out: -  General appearance: alert and cooperative Resp: clear to auscultation bilaterally with some decreased BS bilat Cardio: regular rate and rhythm Extremities: l AVF Sternotomy looks well Lab Results:  [facility] 06/18/12 0612 06/17/12 0408  WBC 7.5 7.8  HGB 10.5* 9.8*  HCT 32.5* 30.5*  PLT 272 169   BMET:  Basename 06/18/12 0612 06/17/12 0408  NA 132* 132*  K 4.2 3.5  CL 92* 95*  CO2 26 27  GLUCOSE 90 105*  BUN 48* 29*  CREATININE 7.16* 4.97*  CALCIUM 9.0 8.2*   No results found for this basename: PTH:2 in the last 72 hours Iron Studies: No results found for this basename: IRON,TIBC,TRANSFERRIN,FERRITIN in the last 72 hours Studies/Results: Dg Chest Port 1 View  06/17/2012  *RADIOLOGY REPORT*  Clinical Data: Short of breath  PORTABLE CHEST - 1 VIEW  Comparison: 06/16/2012  Findings: Moderate bilateral effusions with associated atelectasis are stable.  No evidence of pulmonary edema.  No pneumothorax.  Changes from cardiac surgery are stable.  The cardiac silhouette is mildly enlarged.  Right internal jugular Cordis remains well positioned.  IMPRESSION: No change from previous day's study.   Original  Report Authenticated By: Lajean Manes, M.D.    Scheduled:   . aspirin EC  81 mg Oral Daily  . atorvastatin  10 mg Oral q1800  . cloNIDine  0.2 mg Oral BID  . darbepoetin (ARANESP) injection - DIALYSIS  100 mcg Intravenous Q Fri-HD  . enoxaparin  30 mg Subcutaneous Q24H  . fluticasone  2 spray Each Nare Daily  . folic acid  1 mg Oral Daily  . insulin aspart  0-24 Units Subcutaneous TID PC & HS  . lanthanum  1,000 mg Oral TID WC  . pantoprazole  40 mg Oral QAC breakfast  . sodium chloride  3 mL Intravenous Q12H  . Ticagrelor  90 mg Oral BID     LOS: 10 days   Flara Storti C 06/18/2012,3:07 PM

## 2012-06-18 NOTE — Progress Notes (Signed)
Physical Therapy Treatment Patient Details Name: Shawna [name] MRN: [id number] DOB: 1940/08/28 Today's Date: 06/18/2012 Time: [id number] PT Time Calculation (min): 21 min  PT Assessment / Plan / Recommendation Comments on Treatment Session  Pt s/p CABG and is ESRD.  Will benefit from continued PT to address strength and endurance. Limited today by fatigue.   Needs NHP for therapy.      Follow Up Recommendations  SNF;Supervision/Assistance - 24 hour     Does the patient have the potential to tolerate intense rehabilitation     Barriers to Discharge        Equipment Recommendations  Rolling walker with 5" wheels    Recommendations for Other Services    Frequency Min 3X/week   Plan Frequency remains appropriate;Discharge plan needs to be updated    Precautions / Restrictions Precautions Precautions: Sternal;Fall Restrictions Weight Bearing Restrictions: No   Pertinent Vitals/Pain VSS, No pain    Mobility  Bed Mobility Bed Mobility: Supine to Sit;Sit to Supine Supine to Sit: 3: Mod assist;HOB flat Sitting - Scoot to Edge of Bed: 4: Min assist Sit to Supine: 3: Mod assist;HOB flat Details for Bed Mobility Assistance: verbal cues for technique and to maintain sternal precautions.  Assist to raise trunk off of bed.   Transfers Transfers: Sit to Stand;Stand to Sit Sit to Stand: 4: Min assist;Without upper extremity assist;From bed Stand to Sit: 4: Min assist;Without upper extremity assist;To bed Details for Transfer Assistance: verbal cues for hand placement and precautions.   Ambulation/Gait Ambulation/Gait Assistance: 4: Min guard Ambulation Distance (Feet): 225 Feet Assistive device: Rolling walker Ambulation/Gait Assistance Details: verbal cues to stand erect during gait and to minimize weight through UEs.  Needed constant cues to stay close to RW as well.   Gait Pattern: Step-through pattern;Decreased stride length Gait velocity: Slow gait speed Stairs: No Wheelchair  Mobility Wheelchair Mobility: No     PT Goals Acute Rehab PT Goals PT Goal: Supine/Side to Sit - Progress: Progressing toward goal PT Goal: Sit to Supine/Side - Progress: Progressing toward goal PT Goal: Sit to Stand - Progress: Progressing toward goal PT Goal: Ambulate - Progress: Progressing toward goal  Visit Information  Last PT Received On: 06/18/12 Assistance Needed: +1    Subjective Data  Subjective: "I just don't feel well at all today."   Cognition  Overall Cognitive Status: Appears within functional limits for tasks assessed/performed Arousal/Alertness: Awake/alert Orientation Level: Appears intact for tasks assessed Behavior During Session: [facility] for tasks performed    Balance  Static Standing Balance Static Standing - Balance Support: No upper extremity supported;During functional activity Static Standing - Level of Assistance: 4: Min assist Static Standing - Comment/# of Minutes: Unsteady if UEs not supported on item even thought pt not putting weight through extremities and resting them only.    End of Session PT - End of Session Equipment Utilized During Treatment: Gait belt Activity Tolerance: Patient limited by fatigue Patient left: in bed;with call bell/phone within reach Nurse Communication: Mobility status        INGOLD,Shourya Macpherson 06/18/2012, 1:17 PM  [facility] Acute Rehabilitation [phone] (pager)

## 2012-06-18 NOTE — Progress Notes (Signed)
CARDIAC REHAB PHASE I   PRE:  Rate/Rhythm: 94SR  BP:  Supine:   Sitting: 118/62  Standing:    SaO2: 96%RA  MODE:  Ambulation: 350 ft   POST:  Rate/Rhythem: 104 ST  BP:  Supine:   Sitting: 130/66  Standing:    SaO2: 96-97%RA [id number] Pt walked 350 ft with asst x 2 with rolling walker. Did not need to stop to rest but tired by end of walk. To recliner with call bell. Can be asst x 1.  Shawna [name]

## 2012-06-18 NOTE — Progress Notes (Signed)
Pt refused to change IV site. Pt stated she just came back from dialysis and didn't want to stuck again. Will try tomorrow again.

## 2012-06-18 NOTE — Progress Notes (Signed)
Hemodialysis- BP elevated, UF goal increased per Dr. Florene Glen to 4kg. Pt reported chest discomfort. UF turned off, bfr decreased and 02 applied, pt was also given ultram. Discomfort subsided after 15 minutes. UF was turned back on and dull pain returned. Goal decreased. Treatment completed without further symptoms. Unable to meet UF goal of 4L d/t issue.

## 2012-06-18 NOTE — Procedures (Signed)
Tolerating dialysis and trying to remove excess fluid.  No hemodynamic instability.  bp GENEROUS.  wILL INCREASE GOAL ANOTHER LITER. Shawna [name]

## 2012-06-18 NOTE — Progress Notes (Addendum)
[address]            [place] [address]          [phone]     5 Days Post-Op  Procedure(s) (LRB): CORONARY ARTERY BYPASS GRAFTING (CABG) (N/A) INTRAOPERATIVE TRANSESOPHAGEAL ECHOCARDIOGRAM (N/A) Subjective: Sore but feeling better   Objective  Telemetry sinus rhythm   Temp:  [97.8 F (36.6 C)-99.4 F (37.4 C)] 98.8 F (37.1 C) (12/09 0628) Pulse Rate:  [75-123] 92  (12/09 0841) Resp:  [16-22] 16  (12/09 0628) BP: (113-156)/(46-85) 118/62 mmHg (12/09 0841) SpO2:  [93 %-100 %] 97 % (12/09 0628) Weight:  [151 lb (68.493 kg)] 151 lb (68.493 kg) (12/09 [id number])   Intake/Output Summary (Last 24 hours) at 06/18/12 0904 Last data filed at 06/17/12 1300  Gross per 24 hour  Intake    120 ml  Output      1 ml  Net    119 ml       General appearance: alert, cooperative and no distress Heart: regular rate and rhythm Lungs: dim in bases Abdomen: soft, nontender Extremities: some edema Wound: incisions healing well  Lab Results:  Basename 06/18/12 0612 06/17/12 0408 06/16/12 0428  NA 132* 132* --  K 4.2 3.5 --  CL 92* 95* --  CO2 26 27 --  GLUCOSE 90 105* --  BUN 48* 29* --  CREATININE 7.16* 4.97* --  CALCIUM 9.0 8.2* --  MG -- -- --  PHOS 5.6* -- 6.8*    Basename 06/18/12 0612 06/16/12 0428  AST -- --  ALT -- --  ALKPHOS -- --  BILITOT -- --  PROT -- --  ALBUMIN 2.1* 1.9*   No results found for this basename: LIPASE:2,AMYLASE:2 in the last 72 hours  Basename 06/18/12 0612 06/17/12 0408  WBC 7.5 7.8  NEUTROABS -- --  HGB 10.5* 9.8*  HCT 32.5* 30.5*  MCV 89.3 91.6  PLT 272 169   No results found for this basename: CKTOTAL:4,CKMB:4,TROPONINI:4 in the last 72 hours No components found with this basename: POCBNP:3 No results found for this basename: DDIMER in the last 72 hours No results found for this basename: HGBA1C in the last 72 hours No results found for this basename: CHOL,HDL,LDLCALC,TRIG,CHOLHDL in the last  72 hours No results found for this basename: TSH,T4TOTAL,FREET3,T3FREE,THYROIDAB in the last 72 hours No results found for this basename: VITAMINB12,FOLATE,FERRITIN,TIBC,IRON,RETICCTPCT in the last 72 hours  Medications: Scheduled    . aspirin EC  81 mg Oral Daily  . atorvastatin  10 mg Oral q1800  . [COMPLETED] bisacodyl  10 mg Rectal Once  . cloNIDine  0.2 mg Oral BID  . darbepoetin (ARANESP) injection - DIALYSIS  100 mcg Intravenous Q Fri-HD  . enoxaparin  30 mg Subcutaneous Q24H  . fluticasone  2 spray Each Nare Daily  . folic acid  1 mg Oral Daily  . insulin aspart  0-24 Units Subcutaneous TID PC & HS  . lanthanum  1,000 mg Oral TID WC  . [COMPLETED] moving right along book   Does not apply Once  . pantoprazole  40 mg Oral QAC breakfast  . sodium chloride  3 mL Intravenous Q12H  . Ticagrelor  90 mg Oral BID  . [DISCONTINUED] acetaminophen (TYLENOL) oral liquid 160 mg/5 mL  975 mg Per Tube Q6H  . [DISCONTINUED] acetaminophen  1,000 mg Oral Q6H  . [DISCONTINUED] bisacodyl  10  mg Oral Daily  . [DISCONTINUED] bisacodyl  10 mg Rectal Daily  . [DISCONTINUED] docusate sodium  200 mg Oral Daily  . [DISCONTINUED] doxercalciferol  3 mcg Intravenous Q M,W,F-HD  . [DISCONTINUED] metoprolol tartrate  12.5 mg Per Tube BID  . [DISCONTINUED] metoprolol tartrate  12.5 mg Oral BID  . [DISCONTINUED] multivitamin  1 tablet Oral Daily  . [DISCONTINUED] pantoprazole  40 mg Oral Daily  . [DISCONTINUED] sodium chloride  10-40 mL Intracatheter Q12H  . [DISCONTINUED] sodium chloride  3 mL Intravenous Q12H     Radiology/Studies:  Dg Chest Port 1 View  06/17/2012  *RADIOLOGY REPORT*  Clinical Data: Short of breath  PORTABLE CHEST - 1 VIEW  Comparison: 06/16/2012  Findings: Moderate bilateral effusions with associated atelectasis are stable.  No evidence of pulmonary edema.  No pneumothorax.  Changes from cardiac surgery are stable.  The cardiac silhouette is mildly enlarged.  Right internal jugular  Cordis remains well positioned.  IMPRESSION: No change from previous day's study.   Original Report Authenticated By: Lajean Manes, M.D.     INR: Will add last result for INR, ABG once components are confirmed Will add last 4 CBG results once components are confirmed  Assessment/Plan: S/P Procedure(s) (LRB): CORONARY ARTERY BYPASS GRAFTING (CABG) (N/A) INTRAOPERATIVE TRANSESOPHAGEAL ECHOCARDIOGRAM (N/A)  1. Steady progress 2 rehab/pulm toilet 3 volume/ renal - dialysis 4 vascular following pseudoaneurysm   LOS: 10 days    Shawna [name],Shawna [name] 12/9/20139:04 AM    Home poss tomorrow if does well with dialysis today I have seen and examined Shawna [name] and agree with the above assessment  and plan.  Grace Isaac MD [place] [phone] Office [phone] 06/18/2012 2:23 PM

## 2012-06-18 NOTE — Clinical Social Work Note (Signed)
CSW received consult for SNF placement. FL2 on the chart for MD signature. CSW is working to secure SNF bed that will transport pt to dialysis MWF and is network for insurance.   Thank you,   [name], [place]

## 2012-06-19 DIAGNOSIS — I724 Aneurysm of artery of lower extremity: Secondary | ICD-10-CM

## 2012-06-19 LAB — GLUCOSE, CAPILLARY: Glucose-Capillary: 106 mg/dL — ABNORMAL HIGH (ref 70–99)

## 2012-06-19 MED ORDER — CLONIDINE HCL 0.2 MG PO TABS: 0.2000 mg | ORAL_TABLET | Freq: Two times a day (BID) | ORAL | Status: DC

## 2012-06-19 MED ORDER — AMLODIPINE BESYLATE 5 MG PO TABS
5.0000 mg | ORAL_TABLET | Freq: Every day | ORAL | Status: DC
  Administered 2012-06-19: 5 mg via ORAL
  Filled 2012-06-19 (×2): qty 1

## 2012-06-19 NOTE — Progress Notes (Signed)
Patient ID: Shawna [name], female   DOB: November 03, 1940, 71 y.o.   MRN: [id number]   Appreciate the great care. Cardiology followup will be with Dr. Domenic Polite and Gene Serpe PA-C in the [place] office.  Daryel November, MD

## 2012-06-19 NOTE — Progress Notes (Signed)
CARDIAC REHAB PHASE I   PRE:  Rate/Rhythm: 91 SR  BP:  Supine: 120/40  Sitting:   Standing:    SaO2: 93 RA  MODE:  Ambulation: 370 ft   POST:  Rate/Rhythem: 105 ST  BP:  Supine:   Sitting: 146/60  Standing:    SaO2: 95 RA 1005-1030 Assisted X 1 and used walker to ambulate. Gait steady with walker. Pt c/o of incisional pain this am and feeling bad. Able to get pt to increase distance 20 feet. Tired by end of walk  Pt back to bed after walk for u/s of groin and to have pacing wires d/c.   Deon Pilling

## 2012-06-19 NOTE — Progress Notes (Addendum)
VVS    Right femoral pseudoaneurysm. Needs repeat duplex as discharge approaches but would not repeat until near time of d/c. Moderate carotid stenosis will need follow up duplex in 6 months. I have scheduled this for her.  Duplex order STAT this morning   COLLINS, EMMA MAUREEN PA-C  Still with persistent pseudoaneurysm.  Will schedule for compression later today.  Ruta Hinds, MD [facility] [place] Office: [phone] Pager: [phone]

## 2012-06-19 NOTE — Progress Notes (Signed)
      [facility]           [facility] [place]   Ultrasound guided compression times 2 was done at bed side with successful compressions of pseudoaneurysm right femoral artery.  The patients BP, pulse oxygen levels and HR were stable through out the procedure.  A repeat ultrasound will be performed after 5:15 today and after the patient has been out of bed and walked.   Laurajean Hosek MAUREEN PA-C

## 2012-06-19 NOTE — Progress Notes (Signed)
Pt refused to change IV site tonight. Iv sites look clean, dry and intact. Pt is anticipating d/c to [facility] tomorrow.

## 2012-06-19 NOTE — Progress Notes (Signed)
Pt ambulated 167ft with rolling walker on RA. Pt c/o discomfort on chest and described as pinching. Will administer prn pain med. Will continue to monitor. Pt came back to bed and call light within reach.

## 2012-06-19 NOTE — Progress Notes (Signed)
*  PRELIMINARY RESULTS* Vascular Ultrasound Right Lower Extremity Arterial Duplex / pseudoaneurysm compression has been completed. Right common femoral artery pseudoaneurysm is compressed after two intervals of 5 min compressions. The right common femoral artery and right posterior tibial artery are patent with biphasic flow. Procedure completed under the supervision of Gerri Lins, [place].  06/19/2012 1:19 PM Maudry Mayhew, RDMS, RDCS

## 2012-06-19 NOTE — Progress Notes (Signed)
Assessment:  1. CAD, SP CABG  2. Anemia, on aranesp  3. SecHPTH, on hectoral  4. ESRD with volume overload  5. BP lowish, limiting factor in fluid removal  Plan:  1. Plan HD in AM  Subjective: Interval History: Had HD yesterday. Objective: Vital signs in last 24 hours: Temp:  [97.7 F (36.5 C)-100.3 F (37.9 C)] 98.2 F (36.8 C) (12/10 0431) Pulse Rate:  [87-105] 87  (12/10 0431) Resp:  [16-21] 18  (12/10 0431) BP: (121-201)/(50-87) 124/58 mmHg (12/10 1115) SpO2:  [94 %-98 %] 97 % (12/10 0431) Weight:  [67.359 kg (148 lb 8 oz)-69.4 kg (153 lb)] 67.359 kg (148 lb 8 oz) (12/10 0431) Weight change: 0.907 kg (2 lb)  Intake/Output from previous day: 12/09 0701 - 12/10 0700 In: 120 [P.O.:120] Out: 2933  Intake/Output this shift: Total I/O In: 240 [P.O.:240] Out: -   General appearance: alert and cooperative Resp: clear to auscultation bilaterally Chest wall: no tenderness Cardio: regular rate and rhythm, S1, S2 normal, no murmur, click, rub or gallop GI: soft, non-tender; bowel sounds normal; no masses,  no organomegaly Extremities: edema 2+  Lab Results:  [facility] 06/18/12 0612 06/17/12 0408  WBC 7.5 7.8  HGB 10.5* 9.8*  HCT 32.5* 30.5*  PLT 272 169   BMET:  Basename 06/18/12 0612 06/17/12 0408  NA 132* 132*  K 4.2 3.5  CL 92* 95*  CO2 26 27  GLUCOSE 90 105*  BUN 48* 29*  CREATININE 7.16* 4.97*  CALCIUM 9.0 8.2*   No results found for this basename: PTH:2 in the last 72 hours Iron Studies: No results found for this basename: IRON,TIBC,TRANSFERRIN,FERRITIN in the last 72 hours Studies/Results: No results found. Scheduled:   . amLODipine  5 mg Oral Daily  . aspirin EC  81 mg Oral Daily  . atorvastatin  10 mg Oral q1800  . cloNIDine  0.2 mg Oral BID  . darbepoetin (ARANESP) injection - DIALYSIS  100 mcg Intravenous Q Fri-HD  . enoxaparin  30 mg Subcutaneous Q24H  . fluticasone  2 spray Each Nare Daily  . folic acid  1 mg Oral Daily  . insulin aspart   0-24 Units Subcutaneous TID PC & HS  . lanthanum  1,000 mg Oral TID WC  . pantoprazole  40 mg Oral QAC breakfast  . sodium chloride  3 mL Intravenous Q12H  . Ticagrelor  90 mg Oral BID    LOS: 11 days   Jolee Critcher C 06/19/2012,12:09 PM

## 2012-06-19 NOTE — Progress Notes (Signed)
VASCULAR LAB PRELIMINARY  PRELIMINARY  PRELIMINARY  PRELIMINARY  Right groin duplex completed.    Preliminary report:  Pseudoaneurysm measuring 0.7x 1.4 cm off the CFA. The neck is approx 1 cm long.  Alizon Schmeling, RVT 06/19/2012, 10:41 AM

## 2012-06-19 NOTE — Clinical Social Work Note (Signed)
CSW continues to work on [employer] to [employer]. Possible bed at [facility] pending coordination with dialysis transportation. CSW contacted dialysis unit for tomorrow and requested Pt be moved to earlier in the day if possible due to pending dc to [facility].   [name], [place]

## 2012-06-19 NOTE — Discharge Summary (Signed)
[address]            [place] [address]          [phone]         Discharge Summary  Name: Shawna [name] DOB: Oct 25, 1940 71 y.o. MRN: [id number]   Admission Date: 06/08/2012 Discharge Date: 06/20/2012    Admitting Diagnosis: Chest pain   Discharge Diagnosis:  Coronary artery disease Right femoral pseudoaneurysm  Past Medical History  Diagnosis Date  . Essential hypertension, benign   . Chronic bronchitis   . GERD (gastroesophageal reflux disease)   . PUD (peptic ulcer disease)   . History of lower GI bleeding   . Arthritis   . History of gout   . Ovarian cancer 1992  . Colon cancer   . Coronary atherosclerosis of native coronary artery     a. 12/2011 NSTEMI/Cath/PCI LCX (2.25x14 Resolute DES) & D1 (2.25x22 Resolute DES);  b. 01/2012 Cath/PCI: LM 30, LAD 30p, 40-52m, D1 stent ok, 99 in sm branch of diag, LCX patent stent, OM1 20, RCA 95 ost (4.0x12 Promus DES), EF 55%;  c. 04/2012 Lexi Cardiolite  EF 48%, small area of scar @ base/mid inflat wall with mild peri-infarct ischemia.  . High cholesterol 12/2011  . Chronic diastolic CHF (congestive heart failure)     a. 02/2012 Echo EF 60-65%, nl wall motion, Gr 1 DD, mod MR  . Pneumonia ~ 2009  . Iron deficiency anemia   . TIA (transient ischemic attack)   . Anxiety   . History of blood transfusion 07/2011; 12/2011; 01/2012 X 2; 04/2012  . ESRD (end stage renal disease) on dialysis 12/15/11    a. HD @ [facility]; Mon, Wed, Fri  . Carotid artery disease     a. [id number] LICA, [id number]   . Mitral regurgitation     a. Moderate by echo, 02/2012  . Myocardial infarction     Procedures: CORONARY ARTERY BYPASS GRAFTING x 4 (Left internal mammary artery to left anterior descending, saphenous vein graft to distal right coronary artery, saphenous vein graft to intermediate, saphenous vein graft to circumflex) ENDOSCOPIC VEIN HARVEST LEFT LEG - 06/13/2012    HPI:  The patient is a 71 y.o.  female with multiple medical problems, who  developed chest pain on 06/06/2012. She has had multiple admissions with recurrent chest pain, the last of which was approximately one month ago. In October, she was admitted to [facility] with complaints of classic cardiac chest pain, but because of report of recent negative stress test, further cardiac workup was not done. She is s/p stenting of the Diag & LCX in June 2013. She then had recurrent chest pain in July and stenting of the ostial RCA.Her most recent episode of chest pain occurred the evening of 06/06/2012 while she was sitting and watching TV. She developed sudden onset of 10/10 substernal chest pain with radiation up to her neck, down her left arm, and to the midscapular area. She presented to [facility] where ECG showed ST depression with T changes and poor R progression. ST changes were new. She was treated with morphine, nitroglycerin, and heparin. Troponin was mildly elevated at 0.34 and subsequently rose to 0.36. Her chest pain improved some with the aforementioned interventions but had not completely resolved. She was transferred to [facility] for further evaluation.   Hospital Course:  The patient was [facility] to  [facility]2013She underwent a cardiac catheterization by Dr. Julianne Handler on 06/08/2012. She was found to have a 60-70% stenosis of the distal LAD, a 99% stenosis proximal to circumflex stent, 99% stenosis of OM1, and a 99% restenosis of the stented RCA. A cardiothoracic consultation was requested for the consideration of coronary artery bypass grafting surgery. She denied chest pain or shortness of breath since admission . Dr. Servando Snare saw the patient and agreed with the need for CABG after appropriate Brilinta washout period.  All risks, benefits and alternatives of surgery were explained in detail, and the patient agreed to proceed. She did note complaints of right groin pain post-cath.  A duplex was performed which showed  a partially thrombosed femoral pseudoaneurysm.  A vascular surgery consult was obtained.  Dr. Oneida Alar saw the patient and recommended proceeding with CABG, and that a follow up ultrasound should be performed prior to discharge.  She also has moderate carotid stenosis, which can be followed as an outpatient.    The patient remained stable otherwise, and was taken to the operating room on 06/13/2012 and underwent the above procedure.    The postoperative course has been notable for hypertension, requiring titration of her medications.  She has continued on her regular dialysis schedule.  Brilinta was restarted in light of her one patent stent to the diagonal.  She has remained in sinus rhythm.  An repeat ultrasound was performed of her right groin, which showed no change in the pseudoaneurysm.  An ultrasound guided compression was performed at the bedside successfully on 06/19/2012.  She will require a short term rehab at a skilled nursing facility prior to discharge home, and she is currently medically stable for discharge once a bed is available.     Recent vital signs:  Filed Vitals:   06/19/12 0431  BP: 121/50  Pulse: 87  Temp: 98.2 F (36.8 C)  Resp: 18    Recent laboratory studies:  CBC: Basename 06/18/12 0612 06/17/12 0408  WBC 7.5 7.8  HGB 10.5* 9.8*  HCT 32.5* 30.5*  PLT 272 169   BMET:  Basename 06/18/12 0612 06/17/12 0408  NA 132* 132*  K 4.2 3.5  CL 92* 95*  CO2 26 27  GLUCOSE 90 105*  BUN 48* 29*  CREATININE 7.16* 4.97*  CALCIUM 9.0 8.2*    PT/INR: No results found for this basename: LABPROT,INR in the last 72 hours     Discharge Medications:     Medication List     As of 06/19/2012 11:01 AM    STOP taking these medications         hydrochlorothiazide 12.5 MG capsule   Commonly known as: MICROZIDE      isosorbide mononitrate 60 MG 24 hr tablet   Commonly known as: IMDUR      labetalol 200 MG tablet   Commonly known as: NORMODYNE       nitroGLYCERIN 0.4 MG SL tablet   Commonly known as: NITROSTAT      TAKE these medications         ALPRAZolam 0.25 MG tablet   Commonly known as: XANAX   Take 0.25-0.5 mg by mouth at bedtime as needed. For anxiety.      amLODipine 10 MG tablet   Commonly known as: NORVASC   Take 10 mg by mouth daily.      aspirin 81 MG chewable tablet   Chew 81 mg by mouth daily.      atorvastatin 10 MG tablet  Commonly known as: LIPITOR   Take 10 mg by mouth daily.      cloNIDine 0.2 MG tablet   Commonly known as: CATAPRES   Take 1 tablet (0.2 mg total) by mouth 2 (two) times daily. For blood pressure      fluticasone 50 MCG/ACT nasal spray   Commonly known as: FLONASE   Place 2 sprays into the nose daily.      folic acid 1 MG tablet   Commonly known as: FOLVITE   Take 1 mg by mouth daily.      folic acid-vitamin b complex-vitamin c-selenium-zinc 3 MG Tabs   Take 1 tablet by mouth daily.      lanthanum 1000 MG chewable tablet   Commonly known as: FOSRENOL   Chew 1,000 mg by mouth 3 (three) times daily after meals.      lidocaine-prilocaine cream   Commonly known as: EMLA   Apply 1 application topically as needed. For pain      lisinopril 10 MG tablet   Commonly known as: PRINIVIL,ZESTRIL   Take 10 mg by mouth daily as needed. For blood pressure      loratadine 10 MG tablet   Commonly known as: CLARITIN   Take 10 mg by mouth daily as needed. For allergies.      pantoprazole 20 MG tablet   Commonly known as: PROTONIX   Take 2 tablets (40 mg total) by mouth 2 (two) times daily.      Ticagrelor 90 MG Tabs tablet   Commonly known as: BRILINTA   Take 90 mg by mouth 2 (two) times daily.      traMADol 50 MG tablet   Commonly known as: ULTRAM   Take 25 mg by mouth every 8 (eight) hours as needed. For pain           Discharge Instructions:  The patient is to refrain from driving, heavy lifting or strenuous activity.  May shower daily and clean incisions with soap and  water.  May resume regular diet.   Follow Up:      Discharge Orders    Future Appointments: Provider: Department: Dept Phone: Center:   06/22/2012 3:20 PM Satira Sark, MD [address] [place] ([place] [phone] [facility]   07/09/2012 2:30 PM [facility] [employer] [facility] [place] [id number] [facility]   12/13/2012 10:30 AM Vvs-Lab Lab 3 [facility] [phone] VVS   12/13/2012 11:40 AM Princess Perna, NP [facility] [phone] VVS      . Follow-up Information    Follow up with Dola Argyle, MD. Schedule an appointment as soon as possible for a visit in 2 weeks.   Contact information:   [id number] [place] [address] [phone]       Follow up with Grace Isaac, MD. On 07/09/2012. (Have a chest x-ray at 2:00, then see Dr. Everrett Coombe PA at 2:30)    Contact information:   [place] [address] [phone]       Follow up with Elam Dutch, MD. (6 month follow up for carotid stenosis (12/13/12 at 10:30 am))    Contact information:   [place] [address] [phone]           [place] 06/19/2012, 10:59 AM

## 2012-06-19 NOTE — Progress Notes (Signed)
Pt ambulated in hallway 350 ft with rollin walker and tolerated activity well. Will continue to monitor.

## 2012-06-19 NOTE — Progress Notes (Signed)
[address]            [place] [address]          [phone]     6 Days Post-Op Procedure(s) (LRB): CORONARY ARTERY BYPASS GRAFTING (CABG) (N/A) INTRAOPERATIVE TRANSESOPHAGEAL ECHOCARDIOGRAM (N/A)  Subjective: Still feels weak this am.  Sore from coughing.    Objective: Vital signs in last 24 hours: Patient Vitals for the past 24 hrs:  BP Temp Temp src Pulse Resp SpO2 Weight  06/19/12 0431 121/50 mmHg 98.2 F (36.8 C) Oral 87  18  97 % 148 lb 8 oz (67.359 kg)  06/18/12 2013 155/73 mmHg 100.3 F (37.9 C) Oral 103  20  96 % -  06/18/12 1901 147/54 mmHg 97.7 F (36.5 C) Oral 103  20  97 % -  06/18/12 1835 170/81 mmHg 98.5 F (36.9 C) Oral 99  19  97 % -  06/18/12 1829 171/76 mmHg - - 100  - - -  06/18/12 1804 182/77 mmHg - - 100  - - -  06/18/12 1735 200/81 mmHg - - - - - -  06/18/12 1727 198/72 mmHg - - 105  - - -  06/18/12 1715 188/75 mmHg - - - - - -  06/18/12 1704 - - - - - 98 % -  06/18/12 1700 201/75 mmHg - - 96  - - -  06/18/12 1628 196/87 mmHg - - 95  - - -  06/18/12 1559 162/68 mmHg - - 94  20  - -  06/18/12 1530 174/75 mmHg - - 95  20  - -  06/18/12 1519 157/66 mmHg - - 96  21  - -  06/18/12 1500 162/61 mmHg - - 95  19  - -  06/18/12 1432 150/70 mmHg - - 92  16  - -  06/18/12 1422 144/64 mmHg 98.8 F (37.1 C) Oral - 18  94 % 153 lb (69.4 kg)  06/18/12 1257 148/61 mmHg 98.9 F (37.2 C) Oral 93  18  95 % -  06/18/12 1250 - - - 96  - - -  06/18/12 0841 118/62 mmHg - - 92  - - -   Current Weight  06/19/12 148 lb 8 oz (67.359 kg)     Intake/Output from previous day: 12/09 0701 - 12/10 0700 In: 120 [P.O.:120] Out: 2933   CBGs [id number]  PHYSICAL EXAM:  Heart: RRR Lungs: Clear Wound: Clean and dry Extremities: No significant LE edema.  R groin soft    Lab Results: CBC: Basename 06/18/12 0612 06/17/12 0408  WBC 7.5 7.8  HGB 10.5* 9.8*  HCT 32.5* 30.5*  PLT 272 169   BMET:  Basename 06/18/12 0612  06/17/12 0408  NA 132* 132*  K 4.2 3.5  CL 92* 95*  CO2 26 27  GLUCOSE 90 105*  BUN 48* 29*  CREATININE 7.16* 4.97*  CALCIUM 9.0 8.2*    PT/INR: No results found for this basename: LABPROT,INR in the last 72 hours    Assessment/Plan: S/P Procedure(s) (LRB): CORONARY ARTERY BYPASS GRAFTING (CABG) (N/A) INTRAOPERATIVE TRANSESOPHAGEAL ECHOCARDIOGRAM (N/A)  CV- hypertensive yesterday, but additional fluid removed at HD and since then, BPs trending down.  Was on multiple meds at home, but currently only on Clonidine.  Will restart low dose Norvasc and monitor.  ESRD- renal following.   R femoral pseudoaneurysm- for repeat Duplex today.  Vascular  following.  Disp- SNF when bed available and medically stable.  D/c EPWs and CT sutures.    LOS: 11 days    COLLINS,GINA H 06/19/2012

## 2012-06-19 NOTE — Clinical Social Work Psychosocial (Signed)
Clinical Social Work Department BRIEF PSYCHOSOCIAL ASSESSMENT 06/19/2012  Patient:  Shawna [name], Shawna [name]     Account Number:  [id number]     Admit date:  06/08/2012  Clinical Social Worker:  Layla Maw  Date/Time:  06/18/2012 04:00 PM  Referred by:  Care Management  Date Referred:  06/15/2012 Referred for  SNF Placement   Other Referral:   Interview type:  Patient Other interview type:   Husband    PSYCHOSOCIAL DATA Living Status:  HUSBAND Admitted from facility:   Level of care:   Primary support name:  [name] [place]/[phone] Primary support relationship to patient:  SPOUSE Degree of support available:   strong  2 adult daughters, 1 in [place], 1 in [place] Current Concerns  Post-Acute Placement  Adjustment to Illness   Other Concerns:    [place] / PLAN CSW was referred to Pt to assist with dc planning. Pt is retired, lives with her husband of many years, and needs st-snf for rehab at [facility]. Pt is an existing dialysis patient at [facility]. Pt's husband takes her to dialysis and is involved with Pt's care.  CSW prepared Pt's Fl2 and sent it to [facility]. Pt is a [place] resident but receives all healthcare in [place]. CSW requested verification from physician that Pt will need 30 days or less in order for Pt to be approved for St-SNf. CSW will update medical team as Pt's dc planning progresses.   Assessment/plan status:  Psychosocial Support/Ongoing Assessment of Needs Other assessment/ plan:   Information/referral to community resources:    PATIENT'S/FAMILY'S RESPONSE TO PLAN OF CARE: Pt and Pt's spouse supportive of st-snf at dc in order to maximize Pt's rehab potential.  Pt's spouse is available to assist with transportation to dialysis if needed by snf.   [name], [place]

## 2012-06-19 NOTE — Progress Notes (Addendum)
*  PRELIMINARY RESULTS* Vascular Ultrasound Right Lower Extremity Arterial Duplex / pseudoaneurysm compression has been completed. Right common femoral artery pseudoaneurysm is compressed after two intervals of 5 min compressions. The right common femoral artery and right posterior tibial artery are patent with biphasic flow. Procedure completed under the supervision of Gerri Lins, [place].  06/19/2012 1:19 PM Maudry Mayhew, RDMS, RDCS   Recheck of pseudo completed. Remains thrombosed  06/19/2012 6:59 PM [place]

## 2012-06-19 NOTE — Progress Notes (Signed)
CTs removed per Md order and protocol. Sterris strips applied. Left EPW removed per MD order , wire end intact. Resistance met with Right EPW, charge nurse notified and PA paged. PA to come to bedside. Pt on bedrest.

## 2012-06-20 LAB — RENAL FUNCTION PANEL
Calcium: 9.3 mg/dL (ref 8.4–10.5)
GFR calc Af Amer: 5 mL/min — ABNORMAL LOW (ref >90)
GFR calc non Af Amer: 4 mL/min — ABNORMAL LOW (ref >90)
Glucose, Bld: 144 mg/dL — ABNORMAL HIGH (ref 70–99)
Phosphorus: 4.1 mg/dL (ref 2.3–4.6)
Sodium: 135 mEq/L (ref 135–145)

## 2012-06-20 LAB — CBC
MCH: 29.1 pg (ref 26.0–34.0)
MCHC: 32.6 g/dL (ref 30.0–36.0)
Platelets: 381 10*3/uL (ref 150–400)

## 2012-06-20 MED ORDER — SODIUM CHLORIDE 0.9 % IV SOLN: 100.0000 mL | INTRAVENOUS | Status: DC | PRN

## 2012-06-20 MED ORDER — NEPRO/CARBSTEADY PO LIQD: 237.0000 mL | ORAL | Status: DC | PRN

## 2012-06-20 MED ORDER — HEPARIN SODIUM (PORCINE) 1000 UNIT/ML DIALYSIS: 1000.0000 [IU] | INTRAMUSCULAR | Status: DC | PRN

## 2012-06-20 MED ORDER — TRAMADOL HCL 50 MG PO TABS: 25.0000 mg | ORAL_TABLET | Freq: Three times a day (TID) | ORAL | Status: DC | PRN

## 2012-06-20 MED ORDER — LIDOCAINE HCL (PF) 1 % IJ SOLN: 5.0000 mL | INTRAMUSCULAR | Status: DC | PRN

## 2012-06-20 MED ORDER — ALTEPLASE 2 MG IJ SOLR: 2.0000 mg | Freq: Once | INTRAMUSCULAR | Status: DC | PRN

## 2012-06-20 MED ORDER — HEPARIN SODIUM (PORCINE) 1000 UNIT/ML DIALYSIS: 20.0000 [IU]/kg | INTRAMUSCULAR | Status: DC | PRN

## 2012-06-20 MED ORDER — PENTAFLUOROPROP-TETRAFLUOROETH EX AERO: 1.0000 "application " | INHALATION_SPRAY | CUTANEOUS | Status: DC | PRN

## 2012-06-20 MED ORDER — LIDOCAINE-PRILOCAINE 2.5-2.5 % EX CREA: 1.0000 "application " | TOPICAL_CREAM | CUTANEOUS | Status: DC | PRN

## 2012-06-20 MED ORDER — AMLODIPINE BESYLATE 10 MG PO TABS
10.0000 mg | ORAL_TABLET | Freq: Every day | ORAL | Status: DC
  Administered 2012-06-20: 10 mg via ORAL
  Filled 2012-06-20: qty 1

## 2012-06-20 NOTE — Progress Notes (Signed)
Physical Therapy Treatment Patient Details Name: CASIA PLA MRN: [id number] DOB: 04/22/1941 Today's Date: 06/20/2012 Time: 1345-1415 PT Time Calculation (min): 30 min  PT Assessment / Plan / Recommendation Comments on Treatment Session  S/p CABG, ESRD with HD done earlier this am; Fatigue this PM post HD, and pt did not amb as far as on PT eval; Noted likely for dc to SNF today    Follow Up Recommendations  SNF;Supervision/Assistance - 24 hour     Does the patient have the potential to tolerate intense rehabilitation     Barriers to Discharge        Equipment Recommendations  Rolling walker with 5" wheels    Recommendations for Other Services    Frequency Min 3X/week   Plan Frequency remains appropriate;Discharge plan needs to be updated    Precautions / Restrictions Precautions Precautions: Sternal;Fall   Pertinent Vitals/Pain HR begin 111 HR post amb 124    Mobility  Bed Mobility Bed Mobility: Not assessed (Pt presented to PT sitting EOB) Transfers Transfers: Sit to Stand;Stand to Sit Sit to Stand: 5: Supervision;From bed Stand to Sit: 4: Min guard;To bed Details for Transfer Assistance: verbal cues for hand placement and precautions.   Ambulation/Gait Ambulation/Gait Assistance: 4: Min guard (without physical contact) Ambulation Distance (Feet): 150 Feet Assistive device: Rolling walker Ambulation/Gait Assistance Details: ; Discussed benefit of using RW as opposed to SW with amb to minimize the work of UEs during amb Gait Pattern: Step-through pattern;Decreased stride length Gait velocity: Slow gait speed    Exercises     PT Diagnosis:    PT Problem List:   PT Treatment Interventions:     PT Goals Acute Rehab PT Goals Time For Goal Achievement: 06/24/12 Potential to Achieve Goals: Good Pt will go Sit to Stand: with supervision;without upper extremity assist PT Goal: Sit to Stand - Progress: Progressing toward goal Pt will Ambulate: >150 feet;with  supervision;with least restrictive assistive device PT Goal: Ambulate - Progress: Progressing toward goal  Visit Information  Last PT Received On: 06/20/12 Assistance Needed: +1    Subjective Data  Subjective: Tired post HD, but willing to walk Patient Stated Goal: To go home   Cognition  Overall Cognitive Status: Appears within functional limits for tasks assessed/performed Arousal/Alertness: Awake/alert Orientation Level: Appears intact for tasks assessed Behavior During Session: [facility] for tasks performed    Balance     End of Session PT - End of Session Equipment Utilized During Treatment: Gait belt Activity Tolerance: Patient limited by fatigue (post HD) Patient left: in bed;with call bell/phone within reach;with family/visitor present (Sitting EOB with Cardiac Rehab for education) Nurse Communication: Mobility status   GP     Quin [employer], [place]  06/20/2012, 3:43 PM

## 2012-06-20 NOTE — Progress Notes (Signed)
Completed ed. Voiced understanding and requests her name be sent to [facility] although pt sts she will not be able to do program if there is a copay. Pt declines watching D/C video. [place], ACSM

## 2012-06-20 NOTE — Progress Notes (Signed)
Pt c/o of itch around suture/taped area on chest. No sign of inflammation. Administered PRN hydroxyzine 25mg  and offered ice pack for itching. Pt stated it was effective but just short time. Will apply SARNA lotion.

## 2012-06-20 NOTE — Clinical Social Work Note (Signed)
CSW confirmed SNF bed at [facility] today if medically cleared for dc.  MD notified.   [name], [place]

## 2012-06-20 NOTE — Progress Notes (Signed)
                   [address]            [place] [address]          [phone]      7 Days Post-Op Procedure(s) (LRB): CORONARY ARTERY BYPASS GRAFTING (CABG) (N/A) INTRAOPERATIVE TRANSESOPHAGEAL ECHOCARDIOGRAM (N/A)  Subjective: Patient on hemodialysis. Her right groin has less pain. No other complaints  Objective: Vital signs in last 24 hours: Temp:  [98.2 F (36.8 C)-99.3 F (37.4 C)] 98.8 F (37.1 C) (12/11 0654) Pulse Rate:  [85-101] 90  (12/11 0730) Cardiac Rhythm:  [-] Normal sinus rhythm (12/11 0654) Resp:  [18-26] 26  (12/11 0730) BP: (124-165)/(49-72) 130/60 mmHg (12/11 0730) SpO2:  [94 %-98 %] 94 % (12/11 0654) Weight:  [152 lb 12.5 oz (69.3 kg)-163 lb 2.3 oz (74 kg)] 163 lb 2.3 oz (74 kg) (12/11 0654)  Pre op weight 63 kg Current Weight  06/20/12 163 lb 2.3 oz (74 kg)      Intake/Output from previous day: 12/10 0701 - 12/11 0700 In: 600 [P.O.:600] Out: 0    Physical Exam:  Cardiovascular: RRR Pulmonary: Clear to auscultation bilaterally; no rales, wheezes, or rhonchi. Abdomen: Soft, non tender, bowel sounds present. Extremities: Mild bilateral lower extremity edema. Right groin softer and less ecchymosis than previously Wounds: Clean and dry.  No erythema or signs of infection.  Lab Results: CBC: Basename 06/20/12 0715 06/18/12 0612  WBC 7.4 7.5  HGB 9.8* 10.5*  HCT 30.1* 32.5*  PLT 381 272   BMET:  Basename 06/20/12 0716 06/18/12 0612  NA 135 132*  K 3.4* 4.2  CL 96 92*  CO2 26 26  GLUCOSE 144* 90  BUN 46* 48*  CREATININE 8.11* 7.16*  CALCIUM 9.3 9.0    PT/INR:  Lab Results  Component Value Date   INR 1.34 06/13/2012   INR 1.05 06/12/2012   INR 1.24 01/10/2012   ABG:  INR: Will add last result for INR, ABG once components are confirmed Will add last 4 CBG results once components are confirmed  Assessment/Plan:  1. CV - SR. On Norvasc 5 daily and Clonidine 0.2 bid. Will increase Norvasc to pre op dose of 10 mg  daily for better bp control. 2.  Pulmonary - Encourage incentive spirometer 3. ESRD-continue with HD per nephrology 4.  Anemia (related to above)-H and H this am 9.8 and 30.1. Continue Aranesp with HD on Friday 5.Right femoral pseudoaneurysm s/p compression yesterday. Repeat vascular US showed right common femoral artery and right PT are patent with biphasic flow. Will need follow up of moderate carotid stenosis in 6 months with vascular surgery. 6.Possible discharge to SNF today  ZIMMERMAN,DONIELLE MPA-C 06/20/2012,8:17 AM

## 2012-06-20 NOTE — Progress Notes (Signed)
Pseudoaneurysm thrombosed.  Will schedule followup of moderate carotid stenosis as outpt.  Ruta Hinds, MD [facility] [place] Office: [phone] Pager: [phone]

## 2012-06-20 NOTE — Procedures (Signed)
Dialysis note:  1. CAD, SP CABG  2. Anemia, on aranesp  3. SecHPTH, on hectoral  4. ESRD with volume overload  5. BP has been low, limiting factor in fluid removal   Hemodialysis in progress.  Hemodynamics are stable with improved BP now.  She is comfortable but has significant volume overload with 2+ LE edema.  She reports she is going to SNF today for rehab.  Will increase fluid removal goal by another 1000cc and increase time to 4 and 1/2 hours today.  [place] C

## 2012-06-22 ENCOUNTER — Ambulatory Visit: Payer: Medicare Other | Admitting: Cardiology

## 2012-06-30 ENCOUNTER — Other Ambulatory Visit: Payer: Self-pay | Admitting: Cardiology

## 2012-07-03 ENCOUNTER — Inpatient Hospital Stay (HOSPITAL_COMMUNITY)
Admission: EM | Admit: 2012-07-03 | Discharge: 2012-07-12 | DRG: 313 | Disposition: A | Discharge status: Skilled Nursing Facility | Payer: Medicare Other | Source: Other Acute Inpatient Hospital | Attending: Cardiology | Admitting: Cardiology

## 2012-07-03 ENCOUNTER — Encounter (HOSPITAL_COMMUNITY): Payer: Self-pay | Admitting: General Practice

## 2012-07-03 DIAGNOSIS — I6529 Occlusion and stenosis of unspecified carotid artery: Secondary | ICD-10-CM | POA: Diagnosis present

## 2012-07-03 DIAGNOSIS — D631 Anemia in chronic kidney disease: Secondary | ICD-10-CM | POA: Diagnosis present

## 2012-07-03 DIAGNOSIS — B373 Candidiasis of vulva and vagina: Secondary | ICD-10-CM | POA: Diagnosis present

## 2012-07-03 DIAGNOSIS — I779 Disorder of arteries and arterioles, unspecified: Secondary | ICD-10-CM | POA: Diagnosis present

## 2012-07-03 DIAGNOSIS — Z7902 Long term (current) use of antithrombotics/antiplatelets: Secondary | ICD-10-CM

## 2012-07-03 DIAGNOSIS — K219 Gastro-esophageal reflux disease without esophagitis: Secondary | ICD-10-CM | POA: Diagnosis present

## 2012-07-03 DIAGNOSIS — N189 Chronic kidney disease, unspecified: Secondary | ICD-10-CM | POA: Diagnosis present

## 2012-07-03 DIAGNOSIS — Z992 Dependence on renal dialysis: Secondary | ICD-10-CM | POA: Diagnosis present

## 2012-07-03 DIAGNOSIS — Z79899 Other long term (current) drug therapy: Secondary | ICD-10-CM

## 2012-07-03 DIAGNOSIS — Z880 Allergy status to penicillin: Secondary | ICD-10-CM

## 2012-07-03 DIAGNOSIS — K279 Peptic ulcer, site unspecified, unspecified as acute or chronic, without hemorrhage or perforation: Secondary | ICD-10-CM | POA: Diagnosis present

## 2012-07-03 DIAGNOSIS — Z951 Presence of aortocoronary bypass graft: Secondary | ICD-10-CM

## 2012-07-03 DIAGNOSIS — J42 Unspecified chronic bronchitis: Secondary | ICD-10-CM | POA: Diagnosis present

## 2012-07-03 DIAGNOSIS — Z91041 Radiographic dye allergy status: Secondary | ICD-10-CM

## 2012-07-03 DIAGNOSIS — R059 Cough, unspecified: Secondary | ICD-10-CM | POA: Diagnosis present

## 2012-07-03 DIAGNOSIS — R7881 Bacteremia: Secondary | ICD-10-CM | POA: Diagnosis present

## 2012-07-03 DIAGNOSIS — I10 Essential (primary) hypertension: Secondary | ICD-10-CM | POA: Diagnosis present

## 2012-07-03 DIAGNOSIS — I252 Old myocardial infarction: Secondary | ICD-10-CM

## 2012-07-03 DIAGNOSIS — M109 Gout, unspecified: Secondary | ICD-10-CM | POA: Diagnosis present

## 2012-07-03 DIAGNOSIS — Z882 Allergy status to sulfonamides status: Secondary | ICD-10-CM

## 2012-07-03 DIAGNOSIS — I5033 Acute on chronic diastolic (congestive) heart failure: Secondary | ICD-10-CM

## 2012-07-03 DIAGNOSIS — N2581 Secondary hyperparathyroidism of renal origin: Secondary | ICD-10-CM | POA: Diagnosis present

## 2012-07-03 DIAGNOSIS — R079 Chest pain, unspecified: Secondary | ICD-10-CM

## 2012-07-03 DIAGNOSIS — R05 Cough: Secondary | ICD-10-CM | POA: Diagnosis present

## 2012-07-03 DIAGNOSIS — N039 Chronic nephritic syndrome with unspecified morphologic changes: Secondary | ICD-10-CM | POA: Diagnosis present

## 2012-07-03 DIAGNOSIS — Z7982 Long term (current) use of aspirin: Secondary | ICD-10-CM

## 2012-07-03 DIAGNOSIS — Z8543 Personal history of malignant neoplasm of ovary: Secondary | ICD-10-CM

## 2012-07-03 DIAGNOSIS — R0789 Other chest pain: Secondary | ICD-10-CM

## 2012-07-03 DIAGNOSIS — I509 Heart failure, unspecified: Secondary | ICD-10-CM | POA: Diagnosis present

## 2012-07-03 DIAGNOSIS — Z85038 Personal history of other malignant neoplasm of large intestine: Secondary | ICD-10-CM

## 2012-07-03 DIAGNOSIS — I5032 Chronic diastolic (congestive) heart failure: Secondary | ICD-10-CM | POA: Diagnosis present

## 2012-07-03 DIAGNOSIS — B3731 Acute candidiasis of vulva and vagina: Secondary | ICD-10-CM | POA: Diagnosis present

## 2012-07-03 DIAGNOSIS — Z886 Allergy status to analgesic agent status: Secondary | ICD-10-CM

## 2012-07-03 DIAGNOSIS — E7800 Pure hypercholesterolemia, unspecified: Secondary | ICD-10-CM | POA: Diagnosis present

## 2012-07-03 DIAGNOSIS — I059 Rheumatic mitral valve disease, unspecified: Secondary | ICD-10-CM | POA: Diagnosis present

## 2012-07-03 DIAGNOSIS — F411 Generalized anxiety disorder: Secondary | ICD-10-CM | POA: Diagnosis present

## 2012-07-03 DIAGNOSIS — N186 End stage renal disease: Secondary | ICD-10-CM | POA: Diagnosis present

## 2012-07-03 DIAGNOSIS — E785 Hyperlipidemia, unspecified: Secondary | ICD-10-CM | POA: Diagnosis present

## 2012-07-03 DIAGNOSIS — Z9861 Coronary angioplasty status: Secondary | ICD-10-CM

## 2012-07-03 DIAGNOSIS — I120 Hypertensive chronic kidney disease with stage 5 chronic kidney disease or end stage renal disease: Secondary | ICD-10-CM | POA: Diagnosis present

## 2012-07-03 DIAGNOSIS — Z888 Allergy status to other drugs, medicaments and biological substances status: Secondary | ICD-10-CM

## 2012-07-03 DIAGNOSIS — I2510 Atherosclerotic heart disease of native coronary artery without angina pectoris: Secondary | ICD-10-CM | POA: Diagnosis present

## 2012-07-03 LAB — PHOSPHORUS: Phosphorus: 5 mg/dL — ABNORMAL HIGH (ref 2.3–4.6)

## 2012-07-03 LAB — COMPREHENSIVE METABOLIC PANEL
ALT: 6 U/L (ref 0–35)
Albumin: 2.5 g/dL — ABNORMAL LOW (ref 3.5–5.2)
Alkaline Phosphatase: 68 U/L (ref 39–117)
Potassium: 4.9 mEq/L (ref 3.5–5.1)
Sodium: 132 mEq/L — ABNORMAL LOW (ref 135–145)
Total Protein: 5.5 g/dL — ABNORMAL LOW (ref 6.0–8.3)

## 2012-07-03 LAB — IRON AND TIBC
Saturation Ratios: 13 % — ABNORMAL LOW (ref 20–55)
UIBC: 157 ug/dL (ref 125–400)

## 2012-07-03 LAB — D-DIMER, QUANTITATIVE: D-Dimer, Quant: 1.97 ug/mL-FEU — ABNORMAL HIGH (ref 0.00–0.48)

## 2012-07-03 MED ORDER — HYDROCOD POLST-CHLORPHEN POLST 10-8 MG/5ML PO LQCR
5.0000 mL | Freq: Two times a day (BID) | ORAL | Status: DC | PRN
  Administered 2012-07-04 – 2012-07-06 (×4): 5 mL via ORAL
  Filled 2012-07-03 (×4): qty 5

## 2012-07-03 MED ORDER — ALPRAZOLAM 0.25 MG PO TABS: 0.2500 mg | ORAL_TABLET | Freq: Two times a day (BID) | ORAL | Status: DC | PRN

## 2012-07-03 MED ORDER — SODIUM CHLORIDE 0.9 % IV SOLN: 100.0000 mL | INTRAVENOUS | Status: DC | PRN

## 2012-07-03 MED ORDER — ATORVASTATIN CALCIUM 10 MG PO TABS
10.0000 mg | ORAL_TABLET | Freq: Every day | ORAL | Status: DC
  Administered 2012-07-03 – 2012-07-11 (×8): 10 mg via ORAL
  Filled 2012-07-03 (×10): qty 1

## 2012-07-03 MED ORDER — FLUTICASONE PROPIONATE 50 MCG/ACT NA SUSP
2.0000 | Freq: Every day | NASAL | Status: DC
  Administered 2012-07-03 – 2012-07-12 (×10): 2 via NASAL
  Filled 2012-07-03 (×2): qty 16

## 2012-07-03 MED ORDER — ALTEPLASE 2 MG IJ SOLR
2.0000 mg | Freq: Once | INTRAMUSCULAR | Status: DC | PRN
  Filled 2012-07-03: qty 2

## 2012-07-03 MED ORDER — LIDOCAINE-PRILOCAINE 2.5-2.5 % EX CREA
1.0000 "application " | TOPICAL_CREAM | CUTANEOUS | Status: DC | PRN
  Filled 2012-07-03: qty 5

## 2012-07-03 MED ORDER — NEPRO/CARBSTEADY PO LIQD
237.0000 mL | ORAL | Status: DC | PRN
  Filled 2012-07-03: qty 237

## 2012-07-03 MED ORDER — NITROGLYCERIN 0.4 MG SL SUBL: 0.4000 mg | SUBLINGUAL_TABLET | SUBLINGUAL | Status: DC | PRN

## 2012-07-03 MED ORDER — LIDOCAINE-PRILOCAINE 2.5-2.5 % EX CREA: 1.0000 "application " | TOPICAL_CREAM | CUTANEOUS | Status: DC | PRN

## 2012-07-03 MED ORDER — HEPARIN SODIUM (PORCINE) 1000 UNIT/ML DIALYSIS: 1000.0000 [IU] | INTRAMUSCULAR | Status: DC | PRN

## 2012-07-03 MED ORDER — LANTHANUM CARBONATE 500 MG PO CHEW
1000.0000 mg | CHEWABLE_TABLET | Freq: Three times a day (TID) | ORAL | Status: DC
  Administered 2012-07-03 – 2012-07-12 (×23): 1000 mg via ORAL
  Filled 2012-07-03 (×30): qty 2

## 2012-07-03 MED ORDER — TRAMADOL HCL 50 MG PO TABS
50.0000 mg | ORAL_TABLET | Freq: Four times a day (QID) | ORAL | Status: DC | PRN
  Administered 2012-07-05 – 2012-07-06 (×3): 100 mg via ORAL
  Administered 2012-07-07: 50 mg via ORAL
  Administered 2012-07-08 (×2): 100 mg via ORAL
  Administered 2012-07-09: 50 mg via ORAL
  Administered 2012-07-09 – 2012-07-10 (×2): 100 mg via ORAL
  Administered 2012-07-10: 50 mg via ORAL
  Filled 2012-07-03 (×2): qty 1
  Filled 2012-07-03 (×6): qty 2
  Filled 2012-07-03: qty 1
  Filled 2012-07-03: qty 2

## 2012-07-03 MED ORDER — AMLODIPINE BESYLATE 10 MG PO TABS
10.0000 mg | ORAL_TABLET | Freq: Every day | ORAL | Status: DC
  Filled 2012-07-03: qty 1

## 2012-07-03 MED ORDER — HEPARIN (PORCINE) IN NACL 100-0.45 UNIT/ML-% IJ SOLN
1000.0000 [IU]/h | INTRAMUSCULAR | Status: DC
  Administered 2012-07-03 – 2012-07-04 (×2): 1000 [IU]/h via INTRAVENOUS
  Filled 2012-07-03 (×2): qty 250

## 2012-07-03 MED ORDER — PREDNISONE 50 MG PO TABS
50.0000 mg | ORAL_TABLET | Freq: Four times a day (QID) | ORAL | Status: AC
  Administered 2012-07-03 – 2012-07-04 (×3): 50 mg via ORAL
  Filled 2012-07-03 (×3): qty 1

## 2012-07-03 MED ORDER — DIALYVITE 3000 3 MG PO TABS: 1.0000 | ORAL_TABLET | Freq: Every day | ORAL | Status: DC

## 2012-07-03 MED ORDER — ONDANSETRON HCL 4 MG/2ML IJ SOLN: 4.0000 mg | Freq: Four times a day (QID) | INTRAMUSCULAR | Status: DC | PRN

## 2012-07-03 MED ORDER — FOLIC ACID 1 MG PO TABS
1.0000 mg | ORAL_TABLET | Freq: Every day | ORAL | Status: DC
  Administered 2012-07-03: 1 mg via ORAL
  Filled 2012-07-03: qty 1

## 2012-07-03 MED ORDER — AMLODIPINE BESYLATE 5 MG PO TABS
5.0000 mg | ORAL_TABLET | Freq: Every day | ORAL | Status: DC
  Filled 2012-07-03: qty 1

## 2012-07-03 MED ORDER — LORATADINE 10 MG PO TABS
10.0000 mg | ORAL_TABLET | Freq: Every day | ORAL | Status: DC | PRN
  Filled 2012-07-03: qty 1

## 2012-07-03 MED ORDER — ALPRAZOLAM 0.5 MG PO TABS
ORAL_TABLET | ORAL | Status: AC
  Administered 2012-07-03: 0.5 mg
  Filled 2012-07-03: qty 1

## 2012-07-03 MED ORDER — ACETAMINOPHEN 325 MG PO TABS
650.0000 mg | ORAL_TABLET | ORAL | Status: DC | PRN
  Administered 2012-07-07 – 2012-07-11 (×7): 650 mg via ORAL
  Filled 2012-07-03 (×4): qty 2
  Filled 2012-07-03: qty 1
  Filled 2012-07-03: qty 2

## 2012-07-03 MED ORDER — TICAGRELOR 90 MG PO TABS
90.0000 mg | ORAL_TABLET | Freq: Two times a day (BID) | ORAL | Status: DC
  Administered 2012-07-03 – 2012-07-12 (×19): 90 mg via ORAL
  Filled 2012-07-03 (×20): qty 1

## 2012-07-03 MED ORDER — HEPARIN BOLUS VIA INFUSION
2000.0000 [IU] | Freq: Once | INTRAVENOUS | Status: DC
  Filled 2012-07-03: qty 2000

## 2012-07-03 MED ORDER — LIDOCAINE HCL (PF) 1 % IJ SOLN: 5.0000 mL | INTRAMUSCULAR | Status: DC | PRN

## 2012-07-03 MED ORDER — HEPARIN SODIUM (PORCINE) 1000 UNIT/ML DIALYSIS
100.0000 [IU]/kg | INTRAMUSCULAR | Status: DC | PRN
  Administered 2012-07-03: 6100 [IU] via INTRAVENOUS_CENTRAL
  Filled 2012-07-03: qty 7

## 2012-07-03 MED ORDER — DIPHENHYDRAMINE HCL 50 MG PO CAPS
50.0000 mg | ORAL_CAPSULE | Freq: Once | ORAL | Status: DC
  Filled 2012-07-03: qty 1

## 2012-07-03 MED ORDER — ASPIRIN 81 MG PO CHEW
81.0000 mg | CHEWABLE_TABLET | Freq: Every day | ORAL | Status: DC
  Administered 2012-07-03 – 2012-07-12 (×10): 81 mg via ORAL
  Filled 2012-07-03 (×9): qty 1

## 2012-07-03 MED ORDER — CLONIDINE HCL 0.2 MG PO TABS
0.2000 mg | ORAL_TABLET | Freq: Two times a day (BID) | ORAL | Status: DC
  Filled 2012-07-03 (×2): qty 1

## 2012-07-03 MED ORDER — PENTAFLUOROPROP-TETRAFLUOROETH EX AERO: 1.0000 "application " | INHALATION_SPRAY | CUTANEOUS | Status: DC | PRN

## 2012-07-03 MED ORDER — ZOLPIDEM TARTRATE 5 MG PO TABS: 5.0000 mg | ORAL_TABLET | Freq: Every evening | ORAL | Status: DC | PRN

## 2012-07-03 MED ORDER — ALPRAZOLAM 0.25 MG PO TABS
0.2500 mg | ORAL_TABLET | Freq: Every evening | ORAL | Status: DC | PRN
  Administered 2012-07-04: 0.5 mg via ORAL
  Administered 2012-07-10: 0.25 mg via ORAL
  Filled 2012-07-03: qty 2
  Filled 2012-07-03: qty 1

## 2012-07-03 MED ORDER — DARBEPOETIN ALFA-POLYSORBATE 100 MCG/0.5ML IJ SOLN
100.0000 ug | INTRAMUSCULAR | Status: DC
  Filled 2012-07-03: qty 0.5

## 2012-07-03 MED ORDER — PANTOPRAZOLE SODIUM 40 MG PO TBEC
40.0000 mg | DELAYED_RELEASE_TABLET | Freq: Two times a day (BID) | ORAL | Status: DC
  Administered 2012-07-03 – 2012-07-12 (×16): 40 mg via ORAL
  Filled 2012-07-03 (×16): qty 1

## 2012-07-03 MED ORDER — RENA-VITE PO TABS
1.0000 | ORAL_TABLET | Freq: Every day | ORAL | Status: DC
  Administered 2012-07-03 – 2012-07-12 (×9): 1 via ORAL
  Filled 2012-07-03 (×10): qty 1

## 2012-07-03 NOTE — Procedures (Signed)
Patient was seen on dialysis and the procedure was supervised.  BFR 400  Via AVF BP is  163/76.   Patient appears to be tolerating treatment well  Shawna [name] A 07/03/2012

## 2012-07-03 NOTE — Progress Notes (Signed)
Addendum: received a call from RN stating that the patient had just returned from HD and asking if this patient was supposed to be on heparin.  Unclear why heparin was not started earlier.  Instructed RN to notify MD and start heparin drip now without bolus as patient received heparin in HD this pm.  Will check heparin level in 8h.    Egbert Seidel L. Amada Jupiter, PharmD, [place] Clinical Pharmacist Pager: [phone] Pharmacy: [phone] 07/03/2012 9:12 PM      ANTICOAGULATION CONSULT NOTE - Initial Consult  Pharmacy Consult for heparin Indication: chest pain/ACS  Allergies  Allergen Reactions  . Aspirin Other (See Comments)    Mess up her stomach; "makes my bowels have blood in them". Takes 81 mg EC Aspirin   . Contrast Media (Iodinated Diagnostic Agents) Itching  . Iron Itching and Other (See Comments)    "they gave me iron in dialysis; had to give me Benadryl cause I had to have the iron" (05/02/2012)  . Macrodantin (Nitrofurantoin Macrocrystal) Other (See Comments)    "broke me out in big old knots all over my body; had to go to ER"  . Penicillins Other (See Comments)    "makes me real weak when I take it; like I'll pass out"  . Plavix (Clopidogrel Bisulfate) Rash  . Bactrim (Sulfamethoxazole W-Trimethoprim) Rash  . Sulfa Antibiotics Rash  . Venofer (Ferric Oxide) Itching    Patient reports using Benadryl prior to doses as [place]    Patient Measurements: Height: 5\' 1"  (154.9 cm) Weight: 131 lb 13.4 oz (59.8 kg) IBW/kg (Calculated) : 47.8  Heparin Dosing Weight:   Vital Signs: Temp: 98.5 F (36.9 C) (12/24 2032) Temp src: Oral (12/24 2032) BP: 159/63 mmHg (12/24 2032) Pulse Rate: 117  (12/24 2032)  Labs:  Basename 07/03/12 1504  HGB --  HCT --  PLT --  APTT --  LABPROT --  INR --  HEPARINUNFRC --  CREATININE 8.39*  CKTOTAL --  CKMB --  TROPONINI --    Estimated Creatinine Clearance: 5 ml/min (by C-G formula based on Cr of 8.39).   Medical  History: Past Medical History  Diagnosis Date  . Essential hypertension, benign   . Chronic bronchitis   . GERD (gastroesophageal reflux disease)   . PUD (peptic ulcer disease)   . History of lower GI bleeding   . Arthritis   . History of gout   . Coronary atherosclerosis of native coronary artery     a. 12/2011 NSTEMI/Cath/PCI LCX (2.25x14 Resolute DES) & D1 (2.25x22 Resolute DES);  b. 01/2012 Cath/PCI: LM 30, LAD 30p, 40-21m, D1 stent ok, 99 in sm branch of diag, LCX patent stent, OM1 20, RCA 95 ost (4.0x12 Promus DES), EF 55%;  c. 04/2012 Lexi Cardiolite  EF 48%, small area of scar @ base/mid inflat wall with mild peri-infarct ischemia.  . High cholesterol 12/2011  . Chronic diastolic CHF (congestive heart failure)     a. 02/2012 Echo EF 60-65%, nl wall motion, Gr 1 DD, mod MR  . Pneumonia ~ 2009  . Iron deficiency anemia   . TIA (transient ischemic attack)   . Anxiety   . History of blood transfusion 07/2011; 12/2011; 01/2012 X 2; 04/2012  . ESRD (end stage renal disease) on dialysis 12/15/11    a. HD @ [facility]; Mon, Wed, Fri  . Carotid artery disease     a. [id number] LICA, [id number]   . Mitral regurgitation     a. Moderate by echo, 02/2012  .  Myocardial infarction   . Ovarian cancer 1992  . Colon cancer 1992    Medications:  Prescriptions prior to admission  Medication Sig Dispense Refill  . acetaminophen (TYLENOL) 325 MG tablet Take 650 mg by mouth every 6 (six) hours as needed. For pain/fever      . ALPRAZolam (XANAX) 0.25 MG tablet Take 0.25-0.5 mg by mouth at bedtime as needed. For anxiety.      Marland Kitchen amLODipine (NORVASC) 10 MG tablet Take 10 mg by mouth daily.      Marland Kitchen aspirin 81 MG chewable tablet Chew 81 mg by mouth daily.      Marland Kitchen atorvastatin (LIPITOR) 10 MG tablet Take 10 mg by mouth daily.      . cloNIDine (CATAPRES) 0.2 MG tablet Take 1 tablet (0.2 mg total) by mouth 2 (two) times daily. For blood pressure      . fluticasone (FLONASE) 50 MCG/ACT nasal spray Place 2 sprays into  the nose daily.      . folic acid (FOLVITE) 1 MG tablet Take 1 mg by mouth daily.      . folic acid-vitamin b complex-vitamin c-selenium-zinc (DIALYVITE) 3 MG TABS Take 1 tablet by mouth daily.      Marland Kitchen guaifenesin (ROBITUSSIN) 100 MG/5ML syrup Take 200 mg by mouth 4 (four) times daily as needed. For cough      . lanthanum (FOSRENOL) 1000 MG chewable tablet Chew 1,000 mg by mouth 3 (three) times daily after meals.      Marland Kitchen levalbuterol (XOPENEX) 0.63 MG/3ML nebulizer solution Take 1 ampule by nebulization every 6 (six) hours as needed. For wheezing      . lidocaine-prilocaine (EMLA) cream Apply 1 application topically as needed. For pain      . lisinopril (PRINIVIL,ZESTRIL) 10 MG tablet Take 10 mg by mouth See admin instructions. Hold on dialysis days (MWF); hold if SBP<100      . loratadine (CLARITIN) 10 MG tablet Take 10 mg by mouth daily as needed. For allergies.      . Multiple Vitamin (MULTIVITAMIN WITH MINERALS) TABS Take 1 tablet by mouth daily.      . pantoprazole (PROTONIX) 20 MG tablet Take 20 mg by mouth 2 (two) times daily.      . Ticagrelor (BRILINTA) 90 MG TABS tablet Take 90 mg by mouth 2 (two) times daily.      . traMADol (ULTRAM) 50 MG tablet Take 50 mg by mouth every 8 (eight) hours as needed. For pain      . vitamin C (ASCORBIC ACID) 500 MG tablet Take 500 mg by mouth daily. For 28 days for wound healing      . zinc gluconate 50 MG tablet Take 50 mg by mouth daily. For 15 days for wound healing        Assessment: 71 year old female with recent bypass surgery, discharged on 06/20/2012.  She went to ED due to CP and was transferred to [facility] for evaluation.  She was previously therapeutic on a heparin drip at 1000 units/hr.  Goal of Therapy:  Heparin level 0.3-0.7 units/ml Monitor platelets by anticoagulation protocol: Yes   Plan:  Heparin 2000 units X1 the drip at 1000 units/hr Check HL 8 hours after start. Daily HL and CBC while on heparin. Monitor for s&s  bleeding.

## 2012-07-03 NOTE — Progress Notes (Signed)
Subjective: Interval History: has complaints 71 yr old female with ESRD, on Hd for 2 yr in [place].  D/c here after CABG on 12/11. Onset yest of severe parasternal CP upon awakining Just to R of sternum midchest. Sharp and hurts to cough, deep breath and move..  Low grade temp 99.3.  No cough, ST, aches , rash, N,V,D, HA. Has been swelling and dialzyed 4x last week, but none of antiHTN meds cut despite low BP.  SOB with minimal exertion. .  Objective: Vital signs in last 24 hours: Temp:  [98 F (36.7 C)] 98 F (36.7 C) (12/24 0630) Pulse Rate:  [83-93] 93  (12/24 1235) Resp:  [20] 20  (12/24 0630) BP: (125-155)/(47-66) 155/66 mmHg (12/24 1235) SpO2:  [98 %-100 %] 98 % (12/24 1235) Weight:  [61.3 kg (135 lb 2.3 oz)] 61.3 kg (135 lb 2.3 oz) (12/24 0641) Weight change:   Intake/Output from previous day:   Intake/Output this shift:    General appearance: alert, cooperative and appears stated age Head: Normocephalic, without obvious abnormality, atraumatic Eyes: fundi with scarring Throat: lips, mucosa, and tongue normal; teeth and gums normal Resp: diminished breath sounds bibasilar and rales bibasilar Chest wall: tender R mid chest adj to sternum Cardio: S1, S2 normal and systolic murmur: systolic ejection 2/6, decrescendo at 2nd left intercostal space GI: soft, pos bs, liver down 6 cm Extremities: edema 1+LLE, Tr on R.  and AVF LUA, B&T Skin: dry Lymph nodes: Cervical adenopathy: PCL  Lab Results: No results found for this basename: WBC:2,HGB:2,HCT:2,PLT:2 in the last 72 hours BMET: No results found for this basename: NA:2,K:2,CL:2,CO2:2,GLUCOSE:2,BUN:2,CREATININE:2,CALCIUM:2 in the last 72 hours No results found for this basename: PTH:2 in the last 72 hours Iron Studies: No results found for this basename: IRON,TIBC,TRANSFERRIN,FERRITIN in the last 72 hours  Studies/Results: No results found.  I have reviewed the patient's current medications. Prior to Admission:  Prescriptions  prior to admission  Medication Sig Dispense Refill  . ALPRAZolam (XANAX) 0.25 MG tablet Take 0.25-0.5 mg by mouth at bedtime as needed. For anxiety.      Marland Kitchen amLODipine (NORVASC) 10 MG tablet Take 10 mg by mouth daily.      Marland Kitchen aspirin 81 MG chewable tablet Chew 81 mg by mouth daily.      Marland Kitchen atorvastatin (LIPITOR) 10 MG tablet Take 10 mg by mouth daily.      . cloNIDine (CATAPRES) 0.2 MG tablet Take 1 tablet (0.2 mg total) by mouth 2 (two) times daily. For blood pressure      . fluticasone (FLONASE) 50 MCG/ACT nasal spray Place 2 sprays into the nose daily.      . folic acid (FOLVITE) 1 MG tablet Take 1 mg by mouth daily.      . folic acid-vitamin b complex-vitamin c-selenium-zinc (DIALYVITE) 3 MG TABS Take 1 tablet by mouth daily.      Marland Kitchen lanthanum (FOSRENOL) 1000 MG chewable tablet Chew 1,000 mg by mouth 3 (three) times daily after meals.      . lidocaine-prilocaine (EMLA) cream Apply 1 application topically as needed. For pain      . loratadine (CLARITIN) 10 MG tablet Take 10 mg by mouth daily as needed. For allergies.      Marland Kitchen PROTONIX 20 MG tablet TAKE 2 BY MOUTH TWO TIMES  DAILY  120 tablet  0  . Ticagrelor (BRILINTA) 90 MG TABS tablet Take 90 mg by mouth 2 (two) times daily.      . traMADol (ULTRAM) 50 MG tablet  Take 0.5 tablets (25 mg total) by mouth every 8 (eight) hours as needed. For pain  30 tablet  0    Assessment/Plan: 1 ESRD vol xs. Will lower and recheck cxr.  Needs less bp meds and will adjust.  Will get [facility] records 2 CAD per card 3 CP suspect is rib 4 Anemia will use epo and check Fe, if needs premed 5 HTN lower vol 6 ^ lipids, med  7 Hx multiple cancers  P HD, epo , lower meds f/u cxr.   LOS: 0 days   Orlander Norwood L 07/03/2012,1:17 PM

## 2012-07-03 NOTE — Progress Notes (Signed)
Clinical Social Work Department BRIEF PSYCHOSOCIAL ASSESSMENT 07/03/2012  Patient:  Shawna [name], Shawna [name]     Account Number:  [id number]     Admit date:  07/03/2012  Clinical Social Worker:  Earlie Server  Date/Time:  07/03/2012 12:30 PM  Referred by:  Physician  Date Referred:  07/03/2012 Referred for  SNF Placement   Other Referral:   Interview type:  Patient Other interview type:    PSYCHOSOCIAL DATA Living Status:  FACILITY Admitted from facility:  [facility] Level of care:  [place] Primary support name:  Clarissa Primary support relationship to patient:  CHILD, ADULT Degree of support available:   Strong    CURRENT CONCERNS Current Concerns  Post-Acute Placement   Other Concerns:    SOCIAL WORK ASSESSMENT / PLAN CSW received referral due to patient being admitted from a facility. CSW reviewed chart and met with patient at bedside. No visitors present.    CSW introduced myself and explained role. Patient reports she has been at [facility] for about 2 weeks and had planned to stay about 30 days. Patient agreeable to return at dc. CSW explained dc process and CSW role. Patient understanding of process and reports she will update family of plans.    CSW completed FL2 and placed in chart for MD signature. CSW called SNF and left a message regarding patient returning at dc.   Assessment/plan status:  Psychosocial Support/Ongoing Assessment of Needs Other assessment/ plan:   Information/referral to community resources:   Will return to SNF    PATIENT'S/FAMILY'S RESPONSE TO PLAN OF CARE: Patient alert and oriented. Patient agreeable to CSW consult and engaged throughout assessment. Patient desires to return to SNF at dc.

## 2012-07-03 NOTE — Progress Notes (Signed)
Patient ID: Shawna [name], female   DOB: Oct 02, 1940, 71 y.o.   MRN: [id number] D Dimer is positive. Will treat per Radiology protocol for contrast allergy, which she says causes itching, and order CT Angio. Will begin empiric IV Heparin.

## 2012-07-03 NOTE — Progress Notes (Signed)
ANTICOAGULATION CONSULT NOTE - Initial Consult  Pharmacy Consult for heparin Indication: chest pain/ACS  Allergies  Allergen Reactions  . Aspirin Other (See Comments)    Mess up her stomach; "makes my bowels have blood in them". Takes 81 mg EC Aspirin   . Contrast Media (Iodinated Diagnostic Agents) Itching  . Iron Itching and Other (See Comments)    "they gave me iron in dialysis; had to give me Benadryl cause I had to have the iron" (05/02/2012)  . Macrodantin (Nitrofurantoin Macrocrystal) Other (See Comments)    "broke me out in big old knots all over my body; had to go to ER"  . Penicillins Other (See Comments)    "makes me real weak when I take it; like I'll pass out"  . Plavix (Clopidogrel Bisulfate) Rash  . Bactrim (Sulfamethoxazole W-Trimethoprim) Rash  . Sulfa Antibiotics Rash  . Venofer (Ferric Oxide) Itching    Patient reports using Benadryl prior to doses as [place]    Patient Measurements: Height: 5\' 1"  (154.9 cm) Weight: 135 lb 2.3 oz (61.3 kg) (bedscale) IBW/kg (Calculated) : 47.8  Heparin Dosing Weight:   Vital Signs: Temp: 98 F (36.7 C) (12/24 0630) Temp src: Oral (12/24 0630) BP: 155/66 mmHg (12/24 1235) Pulse Rate: 93  (12/24 1235)  Labs: No results found for this basename: HGB:2,HCT:3,PLT:3,APTT:3,LABPROT:3,INR:3,HEPARINUNFRC:3,CREATININE:3,CKTOTAL:3,CKMB:3,TROPONINI:3 in the last 72 hours  Estimated Creatinine Clearance: 5.3 ml/min (by C-G formula based on Cr of 8.11).   Medical History: Past Medical History  Diagnosis Date  . Essential hypertension, benign   . Chronic bronchitis   . GERD (gastroesophageal reflux disease)   . PUD (peptic ulcer disease)   . History of lower GI bleeding   . Arthritis   . History of gout   . Coronary atherosclerosis of native coronary artery     a. 12/2011 NSTEMI/Cath/PCI LCX (2.25x14 Resolute DES) & D1 (2.25x22 Resolute DES);  b. 01/2012 Cath/PCI: LM 30, LAD 30p, 40-37m, D1 stent ok, 99 in sm branch of  diag, LCX patent stent, OM1 20, RCA 95 ost (4.0x12 Promus DES), EF 55%;  c. 04/2012 Lexi Cardiolite  EF 48%, small area of scar @ base/mid inflat wall with mild peri-infarct ischemia.  . High cholesterol 12/2011  . Chronic diastolic CHF (congestive heart failure)     a. 02/2012 Echo EF 60-65%, nl wall motion, Gr 1 DD, mod MR  . Pneumonia ~ 2009  . Iron deficiency anemia   . TIA (transient ischemic attack)   . Anxiety   . History of blood transfusion 07/2011; 12/2011; 01/2012 X 2; 04/2012  . ESRD (end stage renal disease) on dialysis 12/15/11    a. HD @ [facility]; Mon, Wed, Fri  . Carotid artery disease     a. [id number] LICA, [id number]   . Mitral regurgitation     a. Moderate by echo, 02/2012  . Myocardial infarction   . Ovarian cancer 1992  . Colon cancer 1992    Medications:  Prescriptions prior to admission  Medication Sig Dispense Refill  . ALPRAZolam (XANAX) 0.25 MG tablet Take 0.25-0.5 mg by mouth at bedtime as needed. For anxiety.      Marland Kitchen amLODipine (NORVASC) 10 MG tablet Take 10 mg by mouth daily.      Marland Kitchen aspirin 81 MG chewable tablet Chew 81 mg by mouth daily.      Marland Kitchen atorvastatin (LIPITOR) 10 MG tablet Take 10 mg by mouth daily.      . cloNIDine (CATAPRES) 0.2 MG tablet Take  1 tablet (0.2 mg total) by mouth 2 (two) times daily. For blood pressure      . fluticasone (FLONASE) 50 MCG/ACT nasal spray Place 2 sprays into the nose daily.      . folic acid (FOLVITE) 1 MG tablet Take 1 mg by mouth daily.      . folic acid-vitamin b complex-vitamin c-selenium-zinc (DIALYVITE) 3 MG TABS Take 1 tablet by mouth daily.      Marland Kitchen lanthanum (FOSRENOL) 1000 MG chewable tablet Chew 1,000 mg by mouth 3 (three) times daily after meals.      . lidocaine-prilocaine (EMLA) cream Apply 1 application topically as needed. For pain      . loratadine (CLARITIN) 10 MG tablet Take 10 mg by mouth daily as needed. For allergies.      Marland Kitchen PROTONIX 20 MG tablet TAKE 2 BY MOUTH TWO TIMES  DAILY  120 tablet  0  . Ticagrelor  (BRILINTA) 90 MG TABS tablet Take 90 mg by mouth 2 (two) times daily.      . traMADol (ULTRAM) 50 MG tablet Take 0.5 tablets (25 mg total) by mouth every 8 (eight) hours as needed. For pain  30 tablet  0    Assessment: 71 year old female with recent bypass surgery, discharged on 06/20/2012.  She went to ED due to CP and was transferred to [facility] for evaluation.  She was previously therapeutic on a heparin drip at 1000 units/hr.  Goal of Therapy:  Heparin level 0.3-0.7 units/ml Monitor platelets by anticoagulation protocol: Yes   Plan:  Heparin 2000 units X1 the drip at 1000 units/hr Check HL 8 hours after start. Daily HL and CBC while on heparin. Monitor for s&s bleeding.  Kamonte Mcmichen, [place] 07/03/2012,1:20 PM

## 2012-07-03 NOTE — Progress Notes (Signed)
Paged [facility] to let them know the patient had arrived from [place].

## 2012-07-03 NOTE — H&P (Signed)
History and Physical   Patient ID: Shawna [name] MRN: [id number], DOB/AGE: Feb 25, 1941 71 y.o. Date of Encounter: 07/03/2012  Primary Physician: [name], MD Primary Cardiologist: SM  Chief Complaint: Chest pain  HPI: Shawna [name] is a 71 year old female with recent bypass surgery, discharged on 06/20/2012. Sonce then, she has had a URI treated with antibiotics (completed). She has continued to have problems with cough (productive at times of whitish sputum). She developed right chest pain yesterday, severe, at the lower rib edge just to the right of her sternum. She went to [facility], where she was treated with pain meds and her symptoms improved. She denies any green or yellow sputum, hemoptysis, but did have a low-grade temperature yesterday of 99.6.  Her evaluation at [facility] included a chest x-ray which showed a resolved right pleural effusion, small left pleural effusion, no infiltrate. Her white blood cell count was mildly elevated but her neutrophil count is normal. She is mildly anemic. She is on hemodialysis and needs to be dialyzed today. She's been in a [place] and is compliant with her medications. Her BNP was low in the 300s. A d-dimer has not been obtained. Tramadol helped her pain significantly in the emergency department.  She was transferred to [facility] and currently complains of worsening chest pain.   Past Medical History  Diagnosis Date  . Essential hypertension, benign   . Chronic bronchitis   . GERD (gastroesophageal reflux disease)   . PUD (peptic ulcer disease)   . History of lower GI bleeding   . Arthritis   . History of gout   . Ovarian cancer 1992  . Colon cancer   . Coronary atherosclerosis of native coronary artery     a. 12/2011 NSTEMI/Cath/PCI LCX (2.25x14 Resolute DES) & D1 (2.25x22 Resolute DES);  b. 01/2012 Cath/PCI: LM 30, LAD 30p, 40-48m, D1 stent ok, 99 in sm branch of diag, LCX patent stent, OM1 20, RCA 95  ost (4.0x12 Promus DES), EF 55%;  c. 04/2012 Lexi Cardiolite  EF 48%, small area of scar @ base/mid inflat Shawna [name] with mild peri-infarct ischemia.  . High cholesterol 12/2011  . Chronic diastolic CHF (congestive heart failure)     a. 02/2012 Echo EF 60-65%, nl Shawna [name] motion, Gr 1 DD, mod MR  . Pneumonia ~ 2009  . Iron deficiency anemia   . TIA (transient ischemic attack)   . Anxiety   . History of blood transfusion 07/2011; 12/2011; 01/2012 X 2; 04/2012  . ESRD (end stage renal disease) on dialysis 12/15/11    a. HD @ [facility]; Mon, Wed, Fri  . Carotid artery disease     a. [id number] LICA, [id number]   . Mitral regurgitation     a. Moderate by echo, 02/2012  . Myocardial infarction    Surgical History:  Past Surgical History  Procedure Date  . Abdominal hysterectomy 1992  . Appendectomy 06/1990  . Tubal ligation 1980's  . Av fistula placement 07/2009    left upper arm  . Thrombectomy / arteriovenous graft revision 2011    left upper arm  . Colon resection 1992  . Esophagogastroduodenoscopy 01/20/2012    Procedure: ESOPHAGOGASTRODUODENOSCOPY (EGD);  Surgeon: Ladene Artist, MD,FACG;  Location: [facility] ENDOSCOPY;  Service: Endoscopy;  Laterality: N/A;  . Dilation and curettage of uterus   . Colon surgery   . Coronary angioplasty with stent placement 12/15/11    "2"  . Coronary angioplasty with stent placement y/2013    "  1; makes total of 3" (05/02/2012)  . Coronary artery bypass graft 06/13/2012    Procedure: CORONARY ARTERY BYPASS GRAFTING (CABG);  Surgeon: Grace Isaac, MD;  Location: [place];  Service: Open Heart Surgery; LIMA-LAD, SVG-RCA, SVG-RI, SVG-CFX   . Intraoperative transesophageal echocardiogram 06/13/2012    Procedure: INTRAOPERATIVE TRANSESOPHAGEAL ECHOCARDIOGRAM;  Surgeon: Grace Isaac, MD;  Location: [place];  Service: Open Heart Surgery;  Laterality: N/A;     I have reviewed the patient's current medications. Prior to Admission medications   Medication Sig Start Date End Date  Taking? Authorizing Provider  ALPRAZolam (XANAX) 0.25 MG tablet Take 0.25-0.5 mg by mouth at bedtime as needed. For anxiety.    Historical Provider, MD  amLODipine (NORVASC) 10 MG tablet Take 10 mg by mouth daily.    Historical Provider, MD  aspirin 81 MG chewable tablet Chew 81 mg by mouth daily.    Historical Provider, MD  atorvastatin (LIPITOR) 10 MG tablet Take 10 mg by mouth daily.    Historical Provider, MD  cloNIDine (CATAPRES) 0.2 MG tablet Take 1 tablet (0.2 mg total) by mouth 2 (two) times daily. For blood pressure 06/19/12   Coolidge Breeze, PA  fluticasone (FLONASE) 50 MCG/ACT nasal spray Place 2 sprays into the nose daily.    Historical Provider, MD  folic acid (FOLVITE) 1 MG tablet Take 1 mg by mouth daily.    Historical Provider, MD  folic acid-vitamin b complex-vitamin c-selenium-zinc (DIALYVITE) 3 MG TABS Take 1 tablet by mouth daily.    Historical Provider, MD  lanthanum (FOSRENOL) 1000 MG chewable tablet Chew 1,000 mg by mouth 3 (three) times daily after meals.    Historical Provider, MD  lidocaine-prilocaine (EMLA) cream Apply 1 application topically as needed. For pain    Historical Provider, MD  loratadine (CLARITIN) 10 MG tablet Take 10 mg by mouth daily as needed. For allergies.    Historical Provider, MD  PROTONIX 20 MG tablet TAKE 2 BY MOUTH TWO TIMES  DAILY 06/30/12   Minus Breeding, MD  Ticagrelor (BRILINTA) 90 MG TABS tablet Take 90 mg by mouth 2 (two) times daily.    Historical Provider, MD  traMADol (ULTRAM) 50 MG tablet Take 0.5 tablets (25 mg total) by mouth every 8 (eight) hours as needed. For pain 06/20/12   Nani Skillern, PA    Scheduled Meds:  Continuous Infusions:  PRN Meds:.acetaminophen, ALPRAZolam, nitroGLYCERIN, ondansetron (ZOFRAN) IV, traMADol, zolpidem  Allergies:  Allergies  Allergen Reactions  . Aspirin Other (See Comments)    Mess up her stomach; "makes my bowels have blood in them". Takes 81 mg EC Aspirin   . Contrast Media (Iodinated  Diagnostic Agents) Itching  . Iron Itching    "they gave me iron in dialysis; had to give me Benadryl cause I had to have the iron" (05/02/2012)  . Macrodantin (Nitrofurantoin Macrocrystal) Other (See Comments)    "broke me out in big old knots all over my body; had to go to ER"  . Penicillins Other (See Comments)    "makes me real weak when I take it; like I'll pass out"  . Plavix (Clopidogrel Bisulfate) Rash  . Bactrim (Sulfamethoxazole W-Trimethoprim) Rash  . Sulfa Antibiotics Rash  . Venofer (Ferric Oxide) Itching    Patient reports using Benadryl prior to doses as [place]  . Marital Status: Married    Spouse Name: N/A    Number of Children: N/A  . Years  of Education: N/A   Occupational History  . Not on file.   Social History Main Topics  . Smoking status: Never Smoker   . Smokeless tobacco: Never Used  . Alcohol Use: No  . Drug Use: No  . Sexually Active: Not on file   Other Topics Concern  . Not on file   Social History Narrative   Lives in [place], [place] with husband.  Dialysis pt - [facility].    Family History  Problem Relation Age of Onset  . Other      noncontributory for early CAD    Review of Systems:   Full 14-point review of systems otherwise negative except as noted above.   Physical Exam: Blood pressure 125/47, pulse 83, temperature 98 F (36.7 C), temperature source Oral, resp. rate 20, height 5\' 1"  (1.549 m), weight 135 lb 2.3 oz (61.3 kg), SpO2 100.00%. General: Well developed, well nourished, female in some pain  Kimia Finan C. Verl Blalock, MD, [facility] Pager:  [phone]  Head: Normocephalic, atraumatic, sclera non-icteric, no xanthomas, nares are without discharge. Dentition: poor  Neck: No carotid bruits. JVD not elevated. No thyromegally Lungs: Good expansion bilaterally. without wheezes or rhonchi. Basilar rales. No rub anteriorly or posteriorly. Heart: Regular rate and rhythm with S1 S2.  No S3 or S4.  No  murmur, no rubs, or gallops appreciated. Abdomen: Soft, non-tender, non-distended with normoactive bowel sounds. No hepatomegaly. No rebound/guarding. No obvious abdominal masses. Msk:  Strength and tone appear normal for age. No joint deformities or effusions, no spine or costo-vertebral angle tenderness. Tenderness to chest Shayne Diguglielmo, lower right beside sternum. Extremities: No clubbing or cyanosis. No edema.  Distal pedal pulses are 2+ in lower extrem, HD access in LUE Neuro: Alert and oriented X 3. Moves all extremities spontaneously. No focal deficits noted. Psych:  Responds to questions appropriately with a normal affect. Skin: No rashes or lesions noted  Labs:  see paper chart from [facility]  Radiology/Studies:  Done at [facility] showed improvement in right pleural effusion  ECG: see paper chart, no acute ischemic changes  ASSESSMENT AND PLAN:  Principal Problem:  *Chest Joscelyne Renville pain Active Problems:  Essential hypertension, benign  ESRD (end stage renal disease)   Signed,  Rhonda Barrett PA-C 07/03/2012, 9:26 AM  I have taken a history, reviewed medications, allergies, PMH, SH, FH, and reviewed ROS and examined the patient. History and findings consistent with chest Brandonn Capelli pain from cough over the past week. She has been treated with antibiotics and has no sign of active infection by chest x-ray. Will treat with tramadol and obtain a d-dimer. We have called to have her dialyzed today. I spent a long time talking to her and her husband. Reassurance given. Will monitor closely on telemetry. We'll not cycle enzymes. Esaias Cleavenger C. Verl Blalock, MD, [place] Pager:  [phone]

## 2012-07-04 ENCOUNTER — Observation Stay (HOSPITAL_COMMUNITY): Payer: Medicare Other

## 2012-07-04 DIAGNOSIS — N186 End stage renal disease: Secondary | ICD-10-CM

## 2012-07-04 DIAGNOSIS — I10 Essential (primary) hypertension: Secondary | ICD-10-CM

## 2012-07-04 DIAGNOSIS — R071 Chest pain on breathing: Secondary | ICD-10-CM

## 2012-07-04 DIAGNOSIS — I2510 Atherosclerotic heart disease of native coronary artery without angina pectoris: Secondary | ICD-10-CM

## 2012-07-04 LAB — COMPREHENSIVE METABOLIC PANEL
Albumin: 3.1 g/dL — ABNORMAL LOW (ref 3.5–5.2)
Alkaline Phosphatase: 85 U/L (ref 39–117)
BUN: 23 mg/dL (ref 6–23)
Calcium: 10.3 mg/dL (ref 8.4–10.5)
Potassium: 4.3 mEq/L (ref 3.5–5.1)
Sodium: 136 mEq/L (ref 135–145)
Total Protein: 7 g/dL (ref 6.0–8.3)

## 2012-07-04 LAB — CBC
HCT: 35.9 % — ABNORMAL LOW (ref 36.0–46.0)
Hemoglobin: 11.3 g/dL — ABNORMAL LOW (ref 12.0–15.0)
MCV: 90.2 fL (ref 78.0–100.0)
RDW: 20.5 % — ABNORMAL HIGH (ref 11.5–15.5)
WBC: 7.1 10*3/uL (ref 4.0–10.5)

## 2012-07-04 IMAGING — CR DG CHEST 2V
2 series · 2 of 2 positions shown · non-contrast
Comparison: Portable chest x-ray of [DATE]

CLINICAL DATA: Right-sided chest pain, cough

CHEST - 2 VIEW

[w chest pa]
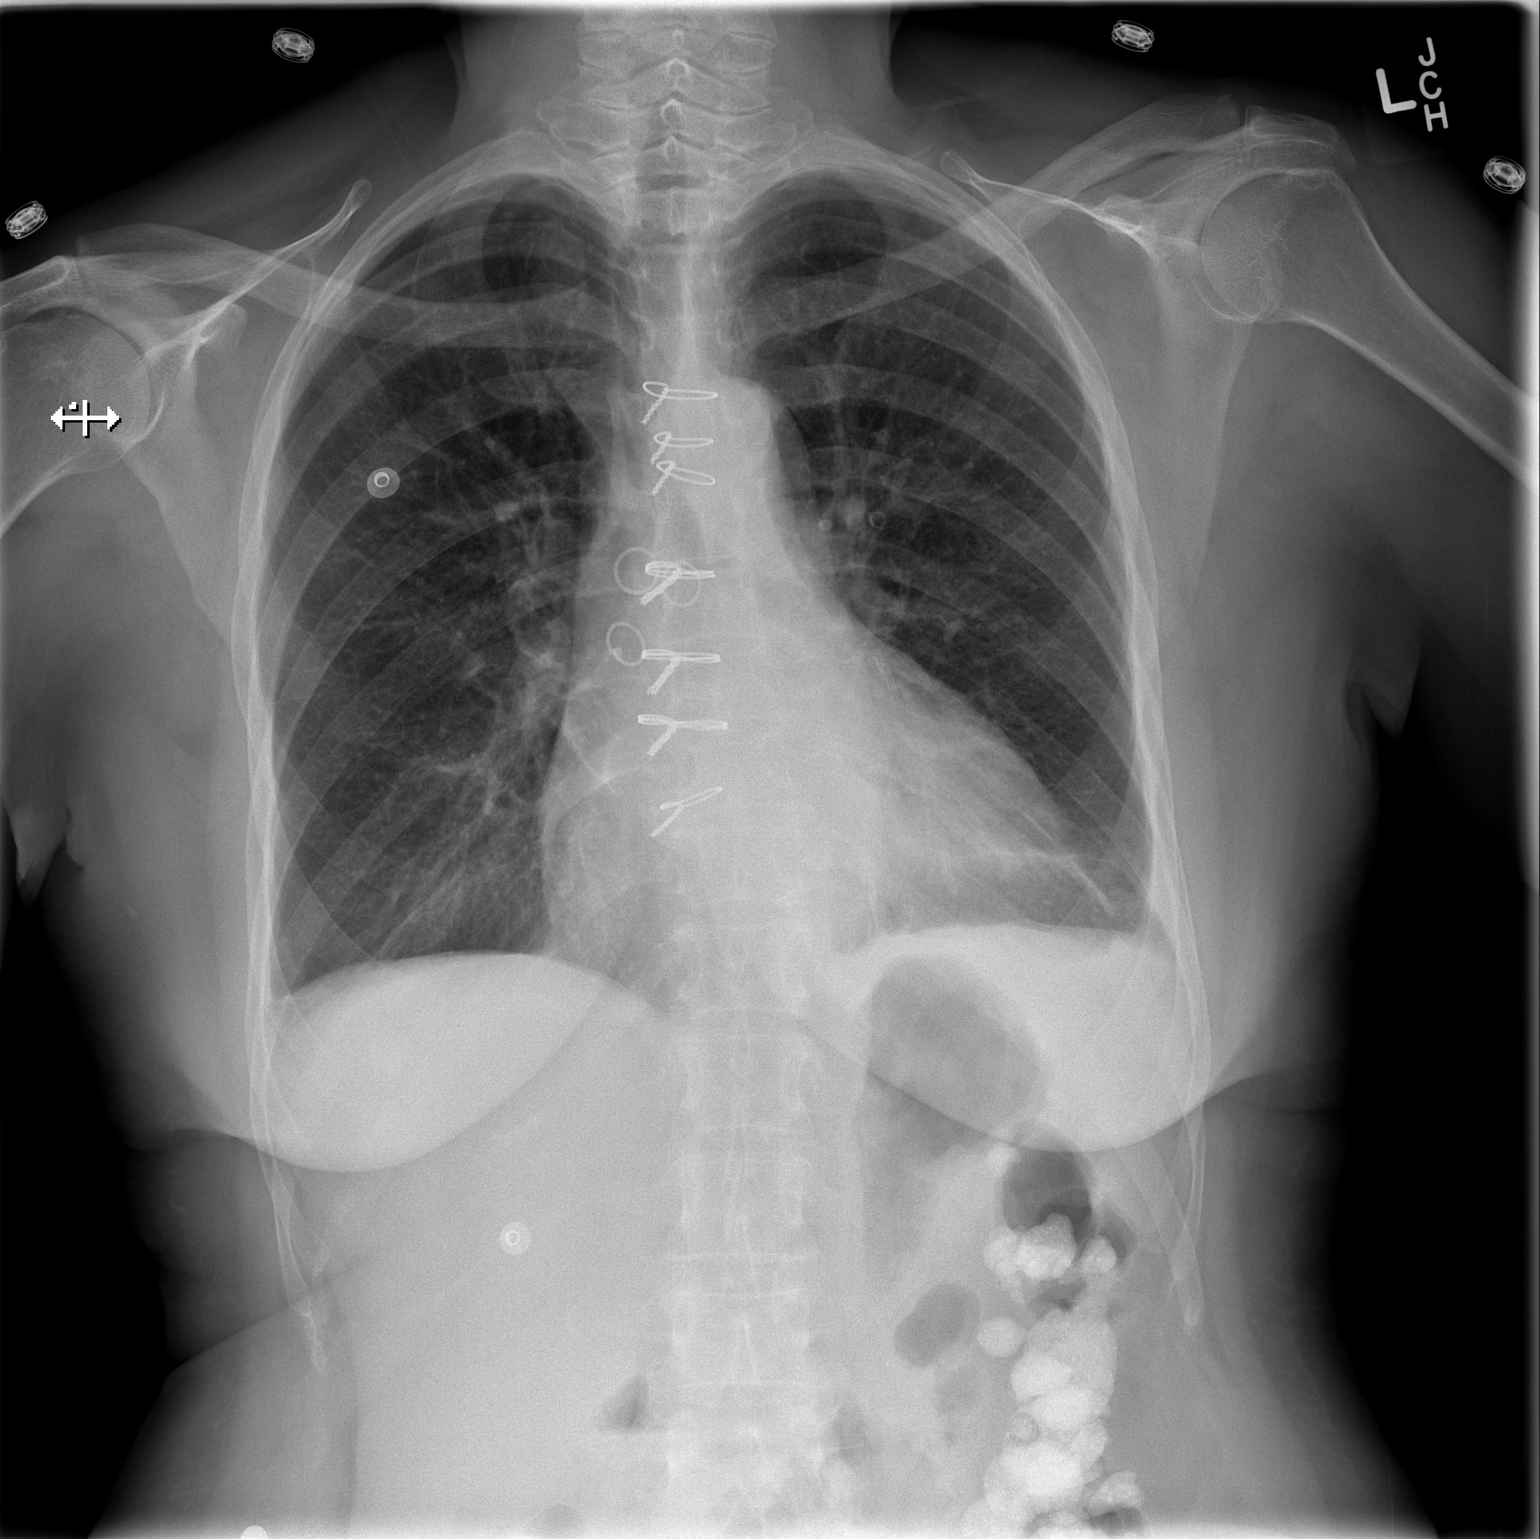

[w chest lat]
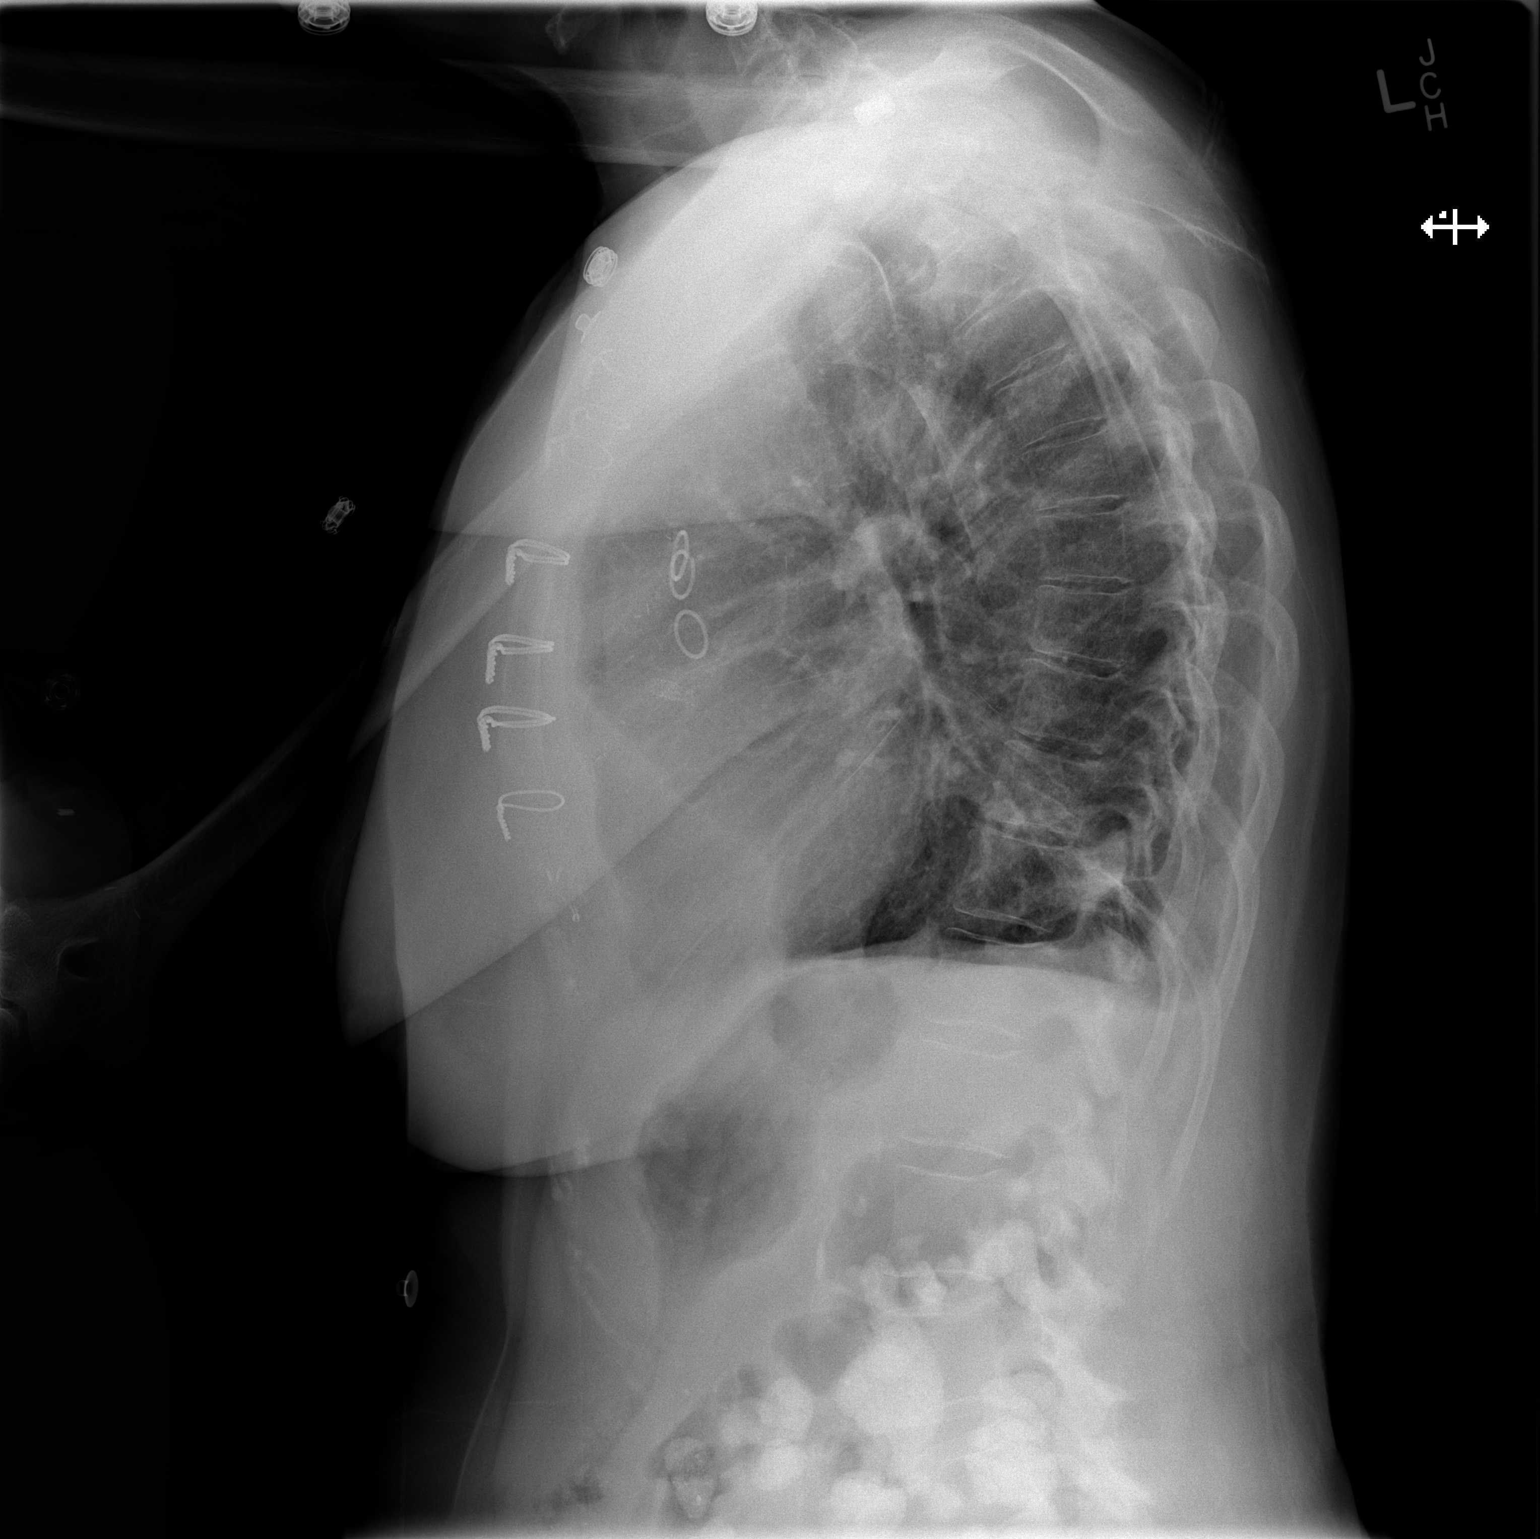

[2 of 2 positions shown; findings below may reference images not displayed]

FINDINGS: Aeration of the lungs has improved.  There do appear to
be small effusions blunting the posterior costophrenic angles and
left basilar linear atelectasis remains.  Cardiomegaly is stable.
No pneumothorax is seen.  Median sternotomy sutures are noted.
IMPRESSION: Improved aeration with only tiny effusions and mild left basilar
linear atelectasis.

## 2012-07-04 MED ORDER — TECHNETIUM TO 99M ALBUMIN AGGREGATED
6.0000 | Freq: Once | INTRAVENOUS | Status: AC | PRN
  Administered 2012-07-04: 6 via INTRAVENOUS

## 2012-07-04 MED ORDER — SODIUM CHLORIDE 0.9 % IV SOLN
125.0000 mg | INTRAVENOUS | Status: DC
  Administered 2012-07-07: 125 mg via INTRAVENOUS
  Filled 2012-07-04 (×6): qty 10

## 2012-07-04 MED ORDER — METOPROLOL SUCCINATE ER 25 MG PO TB24
25.0000 mg | ORAL_TABLET | Freq: Every day | ORAL | Status: DC
  Filled 2012-07-04: qty 1

## 2012-07-04 MED ORDER — AMLODIPINE BESYLATE 10 MG PO TABS
10.0000 mg | ORAL_TABLET | Freq: Every day | ORAL | Status: DC
  Administered 2012-07-04 – 2012-07-11 (×6): 10 mg via ORAL
  Filled 2012-07-04 (×10): qty 1

## 2012-07-04 MED ORDER — METOPROLOL TARTRATE 25 MG PO TABS
25.0000 mg | ORAL_TABLET | Freq: Two times a day (BID) | ORAL | Status: DC
  Administered 2012-07-04 – 2012-07-06 (×5): 25 mg via ORAL
  Filled 2012-07-04 (×8): qty 1

## 2012-07-04 MED ORDER — SODIUM CHLORIDE 0.9 % IV SOLN
INTRAVENOUS | Status: DC
  Administered 2012-07-04: 20:00:00 via INTRAVENOUS

## 2012-07-04 MED ORDER — DEXTROMETHORPHAN POLISTIREX 30 MG/5ML PO LQCR
15.0000 mg | Freq: Two times a day (BID) | ORAL | Status: DC
  Administered 2012-07-04 – 2012-07-05 (×2): 15 mg via ORAL
  Filled 2012-07-04 (×6): qty 5

## 2012-07-04 MED ORDER — CLONIDINE HCL 0.2 MG PO TABS
0.2000 mg | ORAL_TABLET | Freq: Two times a day (BID) | ORAL | Status: DC
  Administered 2012-07-04: 0.2 mg via ORAL
  Filled 2012-07-04 (×4): qty 1

## 2012-07-04 MED ORDER — TECHNETIUM TC 99M DIETHYLENETRIAME-PENTAACETIC ACID: 40.0000 | Freq: Once | INTRAVENOUS | Status: AC | PRN

## 2012-07-04 NOTE — Progress Notes (Signed)
Subjective: Interval History: has complaints anxious to go home.  Objective: Vital signs in last 24 hours: Temp:  [97 F (36.1 C)-98.8 F (37.1 C)] 98.1 F (36.7 C) (12/25 0557) Pulse Rate:  [93-118] 112  (12/25 0750) Resp:  [14-30] 17  (12/25 0557) BP: (80-187)/(50-90) 168/82 mmHg (12/25 0750) SpO2:  [97 %-100 %] 100 % (12/25 0557) Weight:  [59.8 kg (131 lb 13.4 oz)-62.4 kg (137 lb 9.1 oz)] 59.8 kg (131 lb 13.4 oz) (12/24 1945) Weight change: 1.1 kg (2 lb 6.8 oz)  Intake/Output from previous day: 12/24 0701 - 12/25 0700 In: 322 [P.O.:240; I.V.:82] Out: 2933  Intake/Output this shift:    General appearance: alert, cooperative and appears stated age Resp: rales bibasilar Cardio: S1, S2 normal and systolic murmur: systolic ejection 2/6, decrescendo at 2nd left intercostal space GI: pos bs, soft, liver down 4 cm Extremities: AVF LUA B&T  Lab Results:  [facility] 07/04/12 0440  WBC 7.1  HGB 11.3*  HCT 35.9*  PLT 565*   BMET:  Basename 07/04/12 0440 07/03/12 1504  NA 136 132*  K 4.3 4.9  CL 95* 92*  CO2 26 26  GLUCOSE 199* 105*  BUN 23 52*  CREATININE 4.76* 8.39*  CALCIUM 10.3 9.5   No results found for this basename: PTH:2 in the last 72 hours Iron Studies:  Basename 07/03/12 1504  IRON 24*  TIBC 181*  TRANSFERRIN --  FERRITIN --    Studies/Results: No results found.  I have reviewed the patient's current medications.  Assessment/Plan: 1 ESRD still xs vol , but better, will do HD in am 2 HTN ^ with less meds , lower vol, ^ amlod and add Toprol hs with hx ^ HR 3 Anemia add fe as is low 4 CAD s/p CABG per card 5 Chest wall pain  P ^ Norvasc, HD, Fe, epo,Toprol    LOS: 1 day   Inika Bellanger L 07/04/2012,8:49 AM

## 2012-07-04 NOTE — Progress Notes (Signed)
ANTICOAGULATION CONSULT NOTE - Follow Up Consult  Pharmacy Consult for heparin Indication: r/o VTE  Labs:  Basename 07/04/12 1132 07/04/12 0440 07/03/12 1504  HGB -- 11.3* --  HCT -- 35.9* --  PLT -- 565* --  APTT -- -- --  LABPROT -- -- --  INR -- -- --  HEPARINUNFRC 0.48 0.56 --  CREATININE -- 4.76* 8.39*  CKTOTAL -- -- --  CKMB -- -- --  TROPONINI -- -- --    Assessment/Plan:  71yo female therapeutic on heparin with initial dosing while awaiting VQ scan.    Heparin level still therapeutic  Plan: 1) Continue heparin at current rate 2) Follow up AM  Florene Glen, Raina Mina PharmD  07/04/2012,12:30 PM

## 2012-07-04 NOTE — Progress Notes (Signed)
   Primary cardiologist: Dr. Rozann Lesches  Subjective:   Still complains of some mild, pleuritic right-sided chest pain. No cough or hemoptysis.   Objective:   Temp:  [97 F (36.1 C)-98.8 F (37.1 C)] 98.5 F (36.9 C) (12/24 2032) Pulse Rate:  [83-118] 117  (12/24 2032) Resp:  [14-30] 18  (12/24 2032) BP: (80-183)/(47-90) 159/63 mmHg (12/24 2032) SpO2:  [97 %-100 %] 98 % (12/24 2032) Weight:  [131 lb 13.4 oz (59.8 kg)-137 lb 9.1 oz (62.4 kg)] 131 lb 13.4 oz (59.8 kg) (12/24 1945) Last BM Date: 07/03/12  Filed Weights   07/03/12 0641 07/03/12 1438 07/03/12 1945  Weight: 135 lb 2.3 oz (61.3 kg) 137 lb 9.1 oz (62.4 kg) 131 lb 13.4 oz (59.8 kg)    Intake/Output Summary (Last 24 hours) at 07/04/12 0523 Last data filed at 07/03/12 2030  Gross per 24 hour  Intake    240 ml  Output   2933 ml  Net  -2693 ml   Telemetry: Sinus tachycardia.  Exam:  General: No acute distress.  Lungs: No rub, nonlabored, decreased at bases.  Cardiac: RRR, no gallop.  Thorax: Well healing sternal incision.  Extremities: No pitting.  Lab Results:  Basic Metabolic Panel:  Lab [id number] 1504  NA 132*  K 4.9  CL 92*  CO2 26  GLUCOSE 105*  BUN 52*  CREATININE 8.39*  CALCIUM 9.5  MG --    Liver Function Tests:  Lab 07/03/12 1504  AST 13  ALT 6  ALKPHOS 68  BILITOT 0.2*  PROT 5.5*  ALBUMIN 2.5*    Medications:   Scheduled Medications:    . amLODipine  5 mg Oral QHS  . aspirin  81 mg Oral Daily  . atorvastatin  10 mg Oral QPC supper  . darbepoetin (ARANESP) injection - DIALYSIS  100 mcg Intravenous Q Tue-HD  . diphenhydrAMINE  50 mg Oral Once  . fluticasone  2 spray Each Nare Daily  . lanthanum  1,000 mg Oral TID PC  . multivitamin  1 tablet Oral Daily  . pantoprazole  40 mg Oral BID AC  . Ticagrelor  90 mg Oral BID     Infusions:    . heparin 1,000 Units/hr (07/03/12 2148)     PRN Medications:  acetaminophen, ALPRAZolam, chlorpheniramine-HYDROcodone,  lidocaine-prilocaine, loratadine, nitroGLYCERIN, ondansetron (ZOFRAN) IV, traMADol, zolpidem   Assessment:   1. Patient transferred from [facility] on 12/24 secondary to right-sided chest pain, low-grade elevation in temperature. History includes recent URI treated with antibiotics. Elevated d-dimer of 1.97 noted, was scheduled for chest CT angiogram by Dr. Verl Blalock following contrast dye allergy prophylaxis, however adequate IV access has not been able to be obtained, including on evaluation by the IV team. Otherwise afebrile, no acute infiltrates by outside chest x-ray with improving pleural effusions.  2. Multivessel CAD status post recent CABG with LIMA to LAD, SVG to RCA, SVG to ramus, and SVG to circumflex in early December, Dr. Servando Snare.  3. End stage renal disease, on hemodialysis.   Plan/Discussion:    Will cancel CT angiogram of the chest and order a VQ lung scan, PA and lateral chest x-ray to assess for pulmonary embolus. Elevated d-dimer may be secondary to recent surgery however.  She is not hypoxic, but has had sinus tachycardia by telemetry, currently afebrile. If low probability, might consider course of anti-inflammatory agents. Will also add back beta blocker which she had been on the past.  Shawna [name], M.D., F.A.C.C.

## 2012-07-04 NOTE — Progress Notes (Signed)
Paged Dr Fransisca Kaufmann . I.V. Team unable to get 20g. I.V. For C.T. Scan. They tried with [place] And was unsuccessful. Patient had been in Dialysis from approx. 3:00pm to 8:30pm Was told in report Pt. Got Benadryl and pred. Prior to Dialysis.Call not returned. Will discuss with M.D.in A.M. R.N. aware

## 2012-07-04 NOTE — Progress Notes (Signed)
ANTICOAGULATION CONSULT NOTE - Follow Up Consult  Pharmacy Consult for heparin Indication: r/o VTE  Labs:  [facility] 07/04/12 0440 07/03/12 1504  HGB 11.3* --  HCT 35.9* --  PLT 565* --  APTT -- --  LABPROT -- --  INR -- --  HEPARINUNFRC 0.56 --  CREATININE -- 8.39*  CKTOTAL -- --  CKMB -- --  TROPONINI -- --    Assessment/Plan:  71yo female therapeutic on heparin with initial dosing while awaiting VQ scan.  Will continue gtt at current rate and confirm stable with additional level.  Rogue Bussing PharmD BCPS 07/04/2012,6:10 AM

## 2012-07-04 NOTE — Progress Notes (Signed)
07/04/2012 1200 Nursing note Clarified with PA Dayna Dunn on pt. NPO status. Verbal orders received ok to resume prior diet after VQ scan. Orders enacted. Will continue to monitor.  Clevon Khader, Arville Lime

## 2012-07-04 NOTE — Progress Notes (Signed)
Radiology Called and aware Of no I.V. Access for C.T. And will have M.D. Deberah Castle in A. M.

## 2012-07-04 NOTE — Progress Notes (Signed)
07/04/2012 6:40 PM Nursing note RN called to pt. Room pt. C/o "palpitations and headache" BP 185/79 HR NSR 99. Pt. Repositioned in bed. Pt. Also complaining of swelling and palpitations in LUA graft. LUA graft did appear to be slightly swollen and was positive for bruit and thrill.   PA [facility] paged and made aware of pt. Concerns and RN findings. Verbal orders received to give nighttime dose of metoprolol and amlodipine now. Orders enacted. Will continue to closely monitor pt. bp and graft patency.  Antwain Caliendo, Arville Lime

## 2012-07-05 DIAGNOSIS — Z951 Presence of aortocoronary bypass graft: Secondary | ICD-10-CM

## 2012-07-05 DIAGNOSIS — I779 Disorder of arteries and arterioles, unspecified: Secondary | ICD-10-CM | POA: Diagnosis present

## 2012-07-05 LAB — HEPARIN LEVEL (UNFRACTIONATED): Heparin Unfractionated: 0.63 IU/mL (ref 0.30–0.70)

## 2012-07-05 LAB — CBC
HCT: 30.1 % — ABNORMAL LOW (ref 36.0–46.0)
HCT: 29.8 % — ABNORMAL LOW (ref 36.0–46.0)
Hemoglobin: 9.4 g/dL — ABNORMAL LOW (ref 12.0–15.0)
MCHC: 31.9 g/dL (ref 30.0–36.0)
MCHC: 31.5 g/dL (ref 30.0–36.0)
Platelets: 497 10*3/uL — ABNORMAL HIGH (ref 150–400)
RBC: 3.28 MIL/uL — ABNORMAL LOW (ref 3.87–5.11)
RDW: 21 % — ABNORMAL HIGH (ref 11.5–15.5)
WBC: 13.7 10*3/uL — ABNORMAL HIGH (ref 4.0–10.5)
WBC: 11.3 10*3/uL — ABNORMAL HIGH (ref 4.0–10.5)

## 2012-07-05 LAB — RENAL FUNCTION PANEL
BUN: 53 mg/dL — ABNORMAL HIGH (ref 6–23)
Chloride: 96 mEq/L (ref 96–112)
GFR calc Af Amer: 5 mL/min — ABNORMAL LOW (ref >90)
Glucose, Bld: 146 mg/dL — ABNORMAL HIGH (ref 70–99)
Phosphorus: 4.6 mg/dL (ref 2.3–4.6)
Potassium: 4 mEq/L (ref 3.5–5.1)
Sodium: 137 mEq/L (ref 135–145)

## 2012-07-05 LAB — BASIC METABOLIC PANEL
Chloride: 98 mEq/L (ref 96–112)
GFR calc Af Amer: 5 mL/min — ABNORMAL LOW (ref >90)
GFR calc non Af Amer: 5 mL/min — ABNORMAL LOW (ref >90)
Potassium: 4.1 mEq/L (ref 3.5–5.1)
Sodium: 140 mEq/L (ref 135–145)

## 2012-07-05 LAB — HEPATITIS B SURFACE ANTIGEN: Hepatitis B Surface Ag: NEGATIVE

## 2012-07-05 MED ORDER — DIPHENHYDRAMINE HCL 25 MG PO CAPS
25.0000 mg | ORAL_CAPSULE | Freq: Once | ORAL | Status: AC
  Administered 2012-07-05: 25 mg via ORAL

## 2012-07-05 MED ORDER — NEPRO/CARBSTEADY PO LIQD
237.0000 mL | ORAL | Status: DC | PRN
  Filled 2012-07-05: qty 237

## 2012-07-05 MED ORDER — SODIUM CHLORIDE 0.9 % IV SOLN: 100.0000 mL | INTRAVENOUS | Status: DC | PRN

## 2012-07-05 MED ORDER — ALTEPLASE 2 MG IJ SOLR: 2.0000 mg | Freq: Once | INTRAMUSCULAR | Status: DC | PRN

## 2012-07-05 MED ORDER — DIPHENHYDRAMINE HCL 25 MG PO CAPS
ORAL_CAPSULE | ORAL | Status: AC
  Administered 2012-07-05: 25 mg via ORAL
  Filled 2012-07-05: qty 1

## 2012-07-05 MED ORDER — HEPARIN SODIUM (PORCINE) 1000 UNIT/ML DIALYSIS: 1000.0000 [IU] | INTRAMUSCULAR | Status: DC | PRN

## 2012-07-05 MED ORDER — CLONIDINE HCL 0.1 MG PO TABS
0.1000 mg | ORAL_TABLET | Freq: Two times a day (BID) | ORAL | Status: DC
  Administered 2012-07-06 – 2012-07-08 (×3): 0.1 mg via ORAL
  Filled 2012-07-05 (×8): qty 1

## 2012-07-05 MED ORDER — HEPARIN SODIUM (PORCINE) 1000 UNIT/ML DIALYSIS: 100.0000 [IU]/kg | INTRAMUSCULAR | Status: DC | PRN

## 2012-07-05 MED ORDER — PENTAFLUOROPROP-TETRAFLUOROETH EX AERO: 1.0000 "application " | INHALATION_SPRAY | CUTANEOUS | Status: DC | PRN

## 2012-07-05 MED ORDER — SODIUM CHLORIDE 0.9 % IV SOLN: INTRAVENOUS | Status: DC

## 2012-07-05 MED ORDER — LIDOCAINE-PRILOCAINE 2.5-2.5 % EX CREA: 1.0000 "application " | TOPICAL_CREAM | CUTANEOUS | Status: DC | PRN

## 2012-07-05 MED ORDER — PENTAFLUOROPROP-TETRAFLUOROETH EX AERO
INHALATION_SPRAY | CUTANEOUS | Status: AC
  Filled 2012-07-05: qty 103.5

## 2012-07-05 MED ORDER — LIDOCAINE HCL (PF) 1 % IJ SOLN: 5.0000 mL | INTRAMUSCULAR | Status: DC | PRN

## 2012-07-05 NOTE — Progress Notes (Signed)
Clinical Social Work  CSW reviewed chart which stated patient is not ready to dc. CSW called [facility] to inquire if they could accept patient when medically stable to dc. CSW will continue to follow.  [place], [place] [phone]

## 2012-07-05 NOTE — Progress Notes (Signed)
Patient ID: Shawna [name], female   DOB: [birth year], 71 y.o.   MRN: [id number]   SUBJECTIVE:  The patient is feeling better. She's having less chest discomfort. Lung scan was done yesterday showing no evidence of pulmonary embolus. At this point it appears that her pain is either musculoskeletal or pleuritic in nature. I considered using colchicine but I'm not sure if this can be allowed with her end-stage renal disease. I'll need input from the renal team. The patient is volume overloaded. She is for dialysis today.She has been hypertensive and clonidine was added back to her meds.   Filed Vitals:   07/04/12 2220 07/04/12 2315 07/05/12 0535 07/05/12 0637  BP: 177/82 156/83 135/62   Pulse: 92  78   Temp:   98.3 F (36.8 C)   TempSrc:   Oral   Resp: 18  18   Height:      Weight:    134 lb 11.2 oz (61.1 kg)  SpO2:   99%     Intake/Output Summary (Last 24 hours) at 07/05/12 0739 Last data filed at 07/04/12 1924  Gross per 24 hour  Intake    610 ml  Output      0 ml  Net    610 ml    LABS: Basic Metabolic Panel:  Basename 07/05/12 0500 07/04/12 0440 07/03/12 1504  NA 140 136 --  K 4.1 4.3 --  CL 98 95* --  CO2 28 26 --  GLUCOSE 121* 199* --  BUN 47* 23 --  CREATININE 7.82* 4.76* --  CALCIUM 9.8 10.3 --  MG -- -- --  PHOS -- -- 5.0*   Liver Function Tests:  Basename 07/04/12 0440 07/03/12 1504  AST 13 13  ALT 8 6  ALKPHOS 85 68  BILITOT 0.2* 0.2*  PROT 7.0 5.5*  ALBUMIN 3.1* 2.5*   No results found for this basename: LIPASE:2,AMYLASE:2 in the last 72 hours CBC:  Basename 07/05/12 0500 07/04/12 0440  WBC 13.7* 7.1  NEUTROABS -- --  HGB 9.6* 11.3*  HCT 30.1* 35.9*  MCV 90.9 90.2  PLT 497* 565*   Cardiac Enzymes: No results found for this basename: CKTOTAL:3,CKMB:3,CKMBINDEX:3,TROPONINI:3 in the last 72 hours BNP: No components found with this basename: POCBNP:3 D-Dimer:  Basename 07/03/12 1030  DDIMER 1.97*   Hemoglobin A1C: No results found for this  basename: HGBA1C in the last 72 hours Fasting Lipid Panel: No results found for this basename: CHOL,HDL,LDLCALC,TRIG,CHOLHDL,LDLDIRECT in the last 72 hours Thyroid Function Tests: No results found for this basename: TSH,T4TOTAL,FREET3,T3FREE,THYROIDAB in the last 72 hours  RADIOLOGY: Dg Chest 2 View  07/04/2012  *RADIOLOGY REPORT*  Clinical Data: Right-sided chest pain, cough  CHEST - 2 VIEW  Comparison: Portable chest x-ray of 07/03/2012  Findings: Aeration of the lungs has improved.  There do appear to be small effusions blunting the posterior costophrenic angles and left basilar linear atelectasis remains.  Cardiomegaly is stable. No pneumothorax is seen.  Median sternotomy sutures are noted.  IMPRESSION: Improved aeration with only tiny effusions and mild left basilar linear atelectasis.   Original Report Authenticated By: Ivar Drape, M.D.    Dg Chest 2 View  06/09/2012  *RADIOLOGY REPORT*  Clinical Data: Abnormal chest x-ray follow-up. Rule out pneumonia.  CHEST - 2 VIEW  Comparison: One-view chest 06/07/2012.  Findings: The heart is mildly enlarged.  There is no edema or effusion to suggest failure. Right lower lobe pneumonia is suspected with new right lower lobe airspace consolidation since the exam  of 05/30/2012.  The visualized soft tissues and bony thorax are unremarkable.  IMPRESSION:  1.  Right lower lobe pneumonia. 2.  Mild cardiomegaly without failure.   Original Report Authenticated By: San Morelle, M.D.    Nm Pulmonary Perf And Vent  07/04/2012  *RADIOLOGY REPORT*  Clinical Data:  Chest pain.  Status post CABG 3 weeks ago.  NUCLEAR MEDICINE VENTILATION - PERFUSION LUNG SCAN  Technique:  Ventilation images were obtained in multiple projections using inhaled aerosol technetium 99 M DTPA.  Perfusion images were obtained in multiple projections after intravenous injection of Tc-39m MAA.  Radiopharmaceuticals:  20mCi Tc-31m DTPA aerosol and 6.0 mCi Tc-45m MAA.  Comparison: PA and  lateral chest 07/04/2012.  Findings:  Ventilation:   No focal ventilation defect.  Perfusion:   No wedge shaped peripheral perfusion defects to suggest acute pulmonary embolism  IMPRESSION: Negative exam.   Original Report Authenticated By: Orlean Patten, M.D.    Dg Chest Port 1 View  06/17/2012  *RADIOLOGY REPORT*  Clinical Data: Short of breath  PORTABLE CHEST - 1 VIEW  Comparison: 06/16/2012  Findings: Moderate bilateral effusions with associated atelectasis are stable.  No evidence of pulmonary edema.  No pneumothorax.  Changes from cardiac surgery are stable.  The cardiac silhouette is mildly enlarged.  Right internal jugular Cordis remains well positioned.  IMPRESSION: No change from previous day's study.   Original Report Authenticated By: Lajean Manes, M.D.    Dg Chest Port 1 View  06/16/2012  *RADIOLOGY REPORT*  Clinical Data: Bypass surgery.  PORTABLE CHEST - 1 VIEW  Comparison: 06/15/2012.  Findings: The right IJ Cordis is in stable position.  The heart remains enlarged.  Persistent bilateral pleural effusions, bibasilar atelectasis and mild central vascular congestion.  IMPRESSION: Stable chest x-ray findings.   Original Report Authenticated By: Marijo Sanes, M.D.    Dg Chest Portable 1 View In Am  06/15/2012  *RADIOLOGY REPORT*  Clinical Data: Postop CABG  PORTABLE CHEST - 1 VIEW  Comparison: 06/14/2012; 06/13/2012  Findings: Grossly unchanged enlarged cardiac silhouette and mediastinal contours post median sternotomy and CABG.  Interval removal of right jugular approach PA catheter with remaining vascular sheath tip projected over the mid SVC.  Interval removal of left-sided chest tube and mediastinal drain.  No definite pneumothorax.  Small bilateral effusions and bibasilar heterogeneous opacities are unchanged.  Mild pulmonary venous congestion without frank evidence of edema.  Unchanged bones.  IMPRESSION: 1.  Interval removal of support apparatus as above.  No pneumothorax. 2.   Pulmonary congestion without frank evidence of edema. 3.  Grossly unchanged small bilateral effusions and bibasilar opacities, likely atelectasis.   Original Report Authenticated By: Jake Seats, MD    Dg Chest Portable 1 View In Am  06/14/2012  *RADIOLOGY REPORT*  Clinical Data: Status post coronary artery bypass graft.  PORTABLE CHEST - 1 VIEW  Comparison: 1 day prior  Findings: Extubation.  Removal of nasogastric tube.  Mediastinal drain and left-sided chest tube remain in place.  Mild cardiomegaly.  Suspect small layering bilateral pleural effusions. No pneumothorax.  Diminished lung volumes with new or increased mild pulmonary venous congestion.  Decreased bibasilar aeration with increased atelectasis.  IMPRESSION:  1.  Extubation, with slightly decreased aeration; increased bibasilar atelectasis and development of mild pulmonary venous congestion. 2.  Left chest tube remaining in place, without pneumothorax. 3.  Suspect trace bilateral pleural effusions.   Original Report Authenticated By: Abigail Miyamoto, M.D.    Dg Chest Portable 1 View  06/13/2012  *RADIOLOGY REPORT*  Clinical Data: Status post CABG.  PORTABLE CHEST - 1 VIEW  Comparison: 06/09/2012  Findings: Endotracheal tube tip lies approximately 3 cm above the carina.  Swan-Ganz catheter tip is in the proximal right pulmonary artery.  A left chest tube is present without pneumothorax.  Left lower lobe atelectasis present as well as scattered subsegmental atelectasis in both lungs.  No evidence of overt pulmonary edema or significant pleural effusions.  The heart size and mediastinal contours are within normal limits.  IMPRESSION: Scattered atelectasis bilaterally.  No pneumothorax identified.   Original Report Authenticated By: Aletta Edouard, M.D.     PHYSICAL EXAM  Patient is oriented to person time and place. Affect is normal. The patient's husband is in the room. There is no jugulovenous distention. Lungs reveal scattered rhonchi. Cardiac exam  reveals S1 and S2. The abdomen is mildly protuberant but soft.   TELEMETRY: I have reviewed telemetry today July 05, 2012. There is sinus rhythm.   ASSESSMENT AND PLAN:   *Chest wall pain   The patient's chest pain does not appear to be ischemic in origin. There is no evidence of pulmonary embolus. IV heparin is stopped. I need input from the renal team concerning what type of DVT prophylaxis we can use. I'm hesitant to give a dose of Lovenox just prior to dialysis. In addition I'm hesitant to put compression stockings on the patient post CABG without further input from the cardiothoracic team. I will ask Dr. Servando Snare and his team to see the patient to provide further input concerning her chest discomfort. I do not think that there is any evidence of instability of her sternum. However she is early postop and input from the surgical team will clearly be helpful. They can also give me further input about mechanical support to her feet for DVT prophylaxis. I considered using colchicine for her symptoms. However I'm not sure this can be allowed with end-stage renal disease. We will need input from the nephrology team concerning this.    Coronary atherosclerosis of native coronary artery    The patient is post CABG. She is stable in this regard. It is noted that she is on dual antiplatelet therapy. This is because she had a drug-eluting stent placed in June, 2013. This needs to be continued.   Essential hypertension, benign    Blood pressure was elevated yesterday. The patient received some clonidine. I need further input from the nephrology team concerning her exact antihypertensive meds. I have not pushed any additional meds this morning as she will be going for dialysis.   ESRD (end stage renal disease)    Patient is for dialysis today.   Anemia of chronic kidney failure  Secondary hyperparathyroidism   Hx of CABG    Patient is post CABG as outlined above. See cardiac surgery team will be  consulted today for input about her symptoms.   Carotid artery disease     It was noted in the prior notes in the hospital the patient has significant carotid disease that needs outpatient followup.  The patient is not ready to go home yet.  Dola Argyle 07/05/2012 7:39 AM

## 2012-07-05 NOTE — Progress Notes (Signed)
Subjective: Interval History: has complaints puffy.  Objective: Vital signs in last 24 hours: Temp:  [98 F (36.7 C)-98.3 F (36.8 C)] 98.3 F (36.8 C) (12/26 0535) Pulse Rate:  [78-106] 78  (12/26 0535) Resp:  [18] 18  (12/26 0535) BP: (135-192)/(62-85) 135/62 mmHg (12/26 0535) SpO2:  [98 %-100 %] 99 % (12/26 0535) Weight:  [61.1 kg (134 lb 11.2 oz)] 61.1 kg (134 lb 11.2 oz) (12/26 [id number]) Weight change: -1.3 kg (-2 lb 13.9 oz)  Intake/Output from previous day: 12/25 0701 - 12/26 0700 In: 610 [P.O.:360; I.V.:250] Out: -  Intake/Output this shift:    General appearance: alert and cooperative Resp: rales bibasilar Cardio: S1, S2 normal and systolic murmur: systolic ejection 2/6, decrescendo at 2nd left intercostal space GI: pos bs, soft, liver down 4 cm Extremities: edema 2+L, 1+ R  Lab Results:  Basename 07/05/12 0500 07/04/12 0440  WBC 13.7* 7.1  HGB 9.6* 11.3*  HCT 30.1* 35.9*  PLT 497* 565*   BMET:  Basename 07/05/12 0500 07/04/12 0440  NA 140 136  K 4.1 4.3  CL 98 95*  CO2 28 26  GLUCOSE 121* 199*  BUN 47* 23  CREATININE 7.82* 4.76*  CALCIUM 9.8 10.3   No results found for this basename: PTH:2 in the last 72 hours Iron Studies:  Basename 07/03/12 1504  IRON 24*  TIBC 181*  TRANSFERRIN --  FERRITIN --    Studies/Results: Dg Chest 2 View  07/04/2012  *RADIOLOGY REPORT*  Clinical Data: Right-sided chest pain, cough  CHEST - 2 VIEW  Comparison: Portable chest x-ray of 07/03/2012  Findings: Aeration of the lungs has improved.  There do appear to be small effusions blunting the posterior costophrenic angles and left basilar linear atelectasis remains.  Cardiomegaly is stable. No pneumothorax is seen.  Median sternotomy sutures are noted.  IMPRESSION: Improved aeration with only tiny effusions and mild left basilar linear atelectasis.   Original Report Authenticated By: Ivar Drape, M.D.    Nm Pulmonary Perf And Vent  07/04/2012  *RADIOLOGY REPORT*  Clinical  Data:  Chest pain.  Status post CABG 3 weeks ago.  NUCLEAR MEDICINE VENTILATION - PERFUSION LUNG SCAN  Technique:  Ventilation images were obtained in multiple projections using inhaled aerosol technetium 99 M DTPA.  Perfusion images were obtained in multiple projections after intravenous injection of Tc-87m MAA.  Radiopharmaceuticals:  73mCi Tc-34m DTPA aerosol and 6.0 mCi Tc-7m MAA.  Comparison: PA and lateral chest 07/04/2012.  Findings:  Ventilation:   No focal ventilation defect.  Perfusion:   No wedge shaped peripheral perfusion defects to suggest acute pulmonary embolism  IMPRESSION: Negative exam.   Original Report Authenticated By: Orlean Patten, M.D.     I have reviewed the patient's current medications.  Assessment/Plan: 1 CRF for HD to get vol down 2 HTN amlod ^ and restarted clonidine, lower clonidine 3 Anemia epo, to get fe 4 HPTH P 5 CAD s/p CABG 6 Chest wall pain ok to use Colchicine .6mg  per day P HD, bp meds. Epo, fe.    LOS: 2 days   Shawna [name] L 07/05/2012,8:33 AM

## 2012-07-05 NOTE — Procedures (Signed)
I was present at this session.  I have reviewed the session itself and made appropriate changes. Bp tol vol off. LUA avf A&V press ok.  Vol xs. Shawna [name] L 12/26/20133:07 PM

## 2012-07-05 NOTE — Consult Note (Signed)
CARDIOTHORACIC SURGERY CONSULTATION REPORT  PCP is [name], MD Referring Provider is KATZ, JEFFREY D, MD   Reason for consultation:  Chest pain s/p CABG  HPI:  Patient is a 71yo female with multiple medical problems who underwent CABG x4 by Dr Servando Snare on 06/13/2012.  Despite her numerous severe comorbidities she did fairly well and was d/c'd to rehab on 06/20/2012.  She was readmitted to the hospital the day before yesterday with increased pain in her right anterior chest wall exacerbated by coughing. The patient states that she has had a cough off and on since last summer when she was diagnosed with bronchitis. Since her surgery 3 weeks ago she has redeveloped a cough which is for the most part dry but at times productive of his thin whitish sputum. This cough apparently has continued to get worse since hospital discharge. She apparently was treated with antibiotics for presumed bronchitis. She was taken [facility] in [place] where her chest pain improved with pain medication. She was transferred to [facility] and admitted to the cardiology service 2 days ago. Since then she reports that she has been feeling better but her cough persists. She has not had fevers.  Chest x-ray looks clear. V/Q scan is felt to be low risk for PE.  Patient has remained afebrile. Her appetite is good. She has no difficulty swallowing.  Past Medical History  Diagnosis Date  . Essential hypertension, benign   . Chronic bronchitis   . GERD (gastroesophageal reflux disease)   . PUD (peptic ulcer disease)   . History of lower GI bleeding   . Arthritis   . History of gout   . Coronary atherosclerosis of native coronary artery     a. 12/2011 NSTEMI/Cath/PCI LCX (2.25x14 Resolute DES) & D1 (2.25x22 Resolute DES);  b. 01/2012 Cath/PCI: LM 30, LAD 30p, 40-59m, D1 stent ok, 99 in sm branch of diag, LCX patent stent, OM1 20, RCA 95 ost (4.0x12 Promus DES), EF 55%;  c. 04/2012 Lexi  Cardiolite  EF 48%, small area of scar @ base/mid inflat wall with mild peri-infarct ischemia.  . High cholesterol 12/2011  . Chronic diastolic CHF (congestive heart failure)     a. 02/2012 Echo EF 60-65%, nl wall motion, Gr 1 DD, mod MR  . Pneumonia ~ 2009  . Iron deficiency anemia   . TIA (transient ischemic attack)   . Anxiety   . History of blood transfusion 07/2011; 12/2011; 01/2012 X 2; 04/2012  . ESRD (end stage renal disease) on dialysis 12/15/11    a. HD @ [facility]; Mon, Wed, Fri  . Carotid artery disease     a. [id number] LICA, [id number]   . Mitral regurgitation     a. Moderate by echo, 02/2012  . Myocardial infarction   . Ovarian cancer 1992  . Colon cancer 1992    Past Surgical History  Procedure Date  . Abdominal hysterectomy 1992  . Appendectomy 06/1990  . Tubal ligation 1980's  . Av fistula placement 07/2009    left upper arm  . Thrombectomy / arteriovenous graft revision 2011    left upper arm  . Colon resection 1992  . Esophagogastroduodenoscopy 01/20/2012    Procedure: ESOPHAGOGASTRODUODENOSCOPY (EGD);  Surgeon: Ladene Artist, MD,FACG;  Location: [facility] ENDOSCOPY;  Service: Endoscopy;  Laterality: N/A;  . Dilation and curettage of uterus   . Coronary angioplasty with stent placement 12/15/11    "2"  . Coronary angioplasty  with stent placement y/2013    "1; makes total of 3" (05/02/2012)  . Coronary artery bypass graft 06/13/2012    Procedure: CORONARY ARTERY BYPASS GRAFTING (CABG);  Surgeon: Grace Isaac, MD;  Location: [place];  Service: Open Heart Surgery;  Laterality: N/A;  cabg x four;  using left internal mammary artery, and left leg greater saphenous vein harvested endoscopically  . Intraoperative transesophageal echocardiogram 06/13/2012    Procedure: INTRAOPERATIVE TRANSESOPHAGEAL ECHOCARDIOGRAM;  Surgeon: Grace Isaac, MD;  Location: [place];  Service: Open Heart Surgery;  Laterality: N/A;  . Colectomy 1992    Family History  Problem Relation Age of Onset  .  Other      noncontributory for early CAD    History   Social History  . Marital Status: Married    Spouse Name: N/A    Number of Children: N/A  . Years of Education: N/A   Occupational History  . Not on file.   Social History Main Topics  . Smoking status: Never Smoker   . Smokeless tobacco: Never Used  . Alcohol Use: No  . Drug Use: No  . Sexually Active: Yes   Other Topics Concern  . Not on file   Social History Narrative   Lives in [place], [place] with husband.  Dialysis pt - [facility].    Prior to Admission medications   Medication Sig Start Date End Date Taking? Authorizing Provider  acetaminophen (TYLENOL) 325 MG tablet Take 650 mg by mouth every 6 (six) hours as needed. For pain/fever   Yes Historical Provider, MD  ALPRAZolam Duanne Moron) 0.25 MG tablet Take 0.25-0.5 mg by mouth at bedtime as needed. For anxiety.   Yes Historical Provider, MD  amLODipine (NORVASC) 10 MG tablet Take 10 mg by mouth daily.   Yes Historical Provider, MD  aspirin 81 MG chewable tablet Chew 81 mg by mouth daily.   Yes Historical Provider, MD  atorvastatin (LIPITOR) 10 MG tablet Take 10 mg by mouth daily.   Yes Historical Provider, MD  cloNIDine (CATAPRES) 0.2 MG tablet Take 1 tablet (0.2 mg total) by mouth 2 (two) times daily. For blood pressure 06/19/12  Yes Coolidge Breeze, PA  fluticasone (FLONASE) 50 MCG/ACT nasal spray Place 2 sprays into the nose daily.   Yes Historical Provider, MD  folic acid (FOLVITE) 1 MG tablet Take 1 mg by mouth daily.   Yes Historical Provider, MD  folic acid-vitamin b complex-vitamin c-selenium-zinc (DIALYVITE) 3 MG TABS Take 1 tablet by mouth daily.   Yes Historical Provider, MD  guaifenesin (ROBITUSSIN) 100 MG/5ML syrup Take 200 mg by mouth 4 (four) times daily as needed. For cough   Yes Historical Provider, MD  lanthanum (FOSRENOL) 1000 MG chewable tablet Chew 1,000 mg by mouth 3 (three) times daily after meals.   Yes Historical Provider, MD  levalbuterol Penne Lash) 0.63  MG/3ML nebulizer solution Take 1 ampule by nebulization every 6 (six) hours as needed. For wheezing   Yes Historical Provider, MD  lidocaine-prilocaine (EMLA) cream Apply 1 application topically as needed. For pain   Yes Historical Provider, MD  lisinopril (PRINIVIL,ZESTRIL) 10 MG tablet Take 10 mg by mouth See admin instructions. Hold on dialysis days (MWF); hold if SBP<100   Yes Historical Provider, MD  loratadine (CLARITIN) 10 MG tablet Take 10 mg by mouth daily as needed. For allergies.   Yes Historical Provider, MD  Multiple Vitamin (MULTIVITAMIN WITH MINERALS) TABS Take 1 tablet by mouth daily.   Yes Historical Provider, MD  pantoprazole (PROTONIX) 20 MG tablet Take 20 mg by mouth 2 (two) times daily.   Yes Historical Provider, MD  Ticagrelor (BRILINTA) 90 MG TABS tablet Take 90 mg by mouth 2 (two) times daily.   Yes Historical Provider, MD  traMADol (ULTRAM) 50 MG tablet Take 50 mg by mouth every 8 (eight) hours as needed. For pain   Yes Historical Provider, MD  vitamin C (ASCORBIC ACID) 500 MG tablet Take 500 mg by mouth daily. For 28 days for wound healing   Yes Historical Provider, MD  zinc gluconate 50 MG tablet Take 50 mg by mouth daily. For 15 days for wound healing   Yes Historical Provider, MD    Current Facility-Administered Medications  Medication Dose Route Frequency Provider Last Rate Last Dose  . 0.9 %  sodium chloride infusion   Intravenous Continuous Carlena Bjornstad, MD      . acetaminophen (TYLENOL) tablet 650 mg  650 mg Oral Q4H PRN Lonn Georgia, PA      . ALPRAZolam Duanne Moron) tablet 0.25-0.5 mg  0.25-0.5 mg Oral QHS PRN Lonn Georgia, PA   0.5 mg at 07/04/12 2131  . amLODipine (NORVASC) tablet 10 mg  10 mg Oral QHS Placido Sou, MD   10 mg at 07/04/12 1838  . aspirin chewable tablet 81 mg  81 mg Oral Daily Rhonda G Barrett, PA   81 mg at 07/05/12 1046  . atorvastatin (LIPITOR) tablet 10 mg  10 mg Oral QPC supper Evelene Croon Barrett, PA   10 mg at 07/04/12 1700  .  chlorpheniramine-HYDROcodone (TUSSIONEX) 10-8 MG/5ML suspension 5 mL  5 mL Oral Q12H PRN Evelene Croon Barrett, PA   5 mL at 07/05/12 1036  . cloNIDine (CATAPRES) tablet 0.1 mg  0.1 mg Oral BID Placido Sou, MD      . darbepoetin (ARANESP) injection 100 mcg  100 mcg Intravenous Q Tue-HD Placido Sou, MD      . dextromethorphan (DELSYM) 30 MG/5ML liquid 15 mg  15 mg Oral BID Satira Sark, MD   15 mg at 07/05/12 1046  . diphenhydrAMINE (BENADRYL) capsule 50 mg  50 mg Oral Once Renella Cunas, MD      . ferric gluconate (NULECIT) 125 mg in sodium chloride 0.9 % 100 mL IVPB  125 mg Intravenous Q T,Th,Sa-HD Placido Sou, MD      . fluticasone (FLONASE) 50 MCG/ACT nasal spray 2 spray  2 spray Each Nare Daily Evelene Croon Barrett, PA   2 spray at 07/05/12 1038  . lanthanum (FOSRENOL) chewable tablet 1,000 mg  1,000 mg Oral TID PC Rhonda G Barrett, PA   1,000 mg at 07/05/12 1302  . lidocaine-prilocaine (EMLA) cream 1 application  1 application Topical PRN Evelene Croon Barrett, PA      . loratadine (CLARITIN) tablet 10 mg  10 mg Oral Daily PRN Evelene Croon Barrett, PA      . metoprolol tartrate (LOPRESSOR) tablet 25 mg  25 mg Oral BID Satira Sark, MD   25 mg at 07/05/12 1000  . multivitamin (RENA-VIT) tablet 1 tablet  1 tablet Oral Daily Renella Cunas, MD   1 tablet at 07/05/12 1038  . nitroGLYCERIN (NITROSTAT) SL tablet 0.4 mg  0.4 mg Sublingual Q5 Min x 3 PRN Rhonda G Barrett, PA      . ondansetron (ZOFRAN) injection 4 mg  4 mg Intravenous Q6H PRN Rhonda G Barrett, PA      . pantoprazole (PROTONIX)  EC tablet 40 mg  40 mg Oral BID AC Rhonda G Barrett, PA   40 mg at 07/05/12 0600  . Ticagrelor (BRILINTA) tablet 90 mg  90 mg Oral BID Evelene Croon Barrett, PA   90 mg at 07/05/12 1038  . traMADol (ULTRAM) tablet 50-100 mg  50-100 mg Oral Q6H PRN Evelene Croon Barrett, PA   100 mg at 07/05/12 1310  . zolpidem (AMBIEN) tablet 5 mg  5 mg Oral QHS PRN Lonn Georgia, PA        Allergies  Allergen Reactions  .  Aspirin Other (See Comments)    Mess up her stomach; "makes my bowels have blood in them". Takes 81 mg EC Aspirin   . Contrast Media (Iodinated Diagnostic Agents) Itching  . Iron Itching and Other (See Comments)    "they gave me iron in dialysis; had to give me Benadryl cause I had to have the iron" (05/02/2012)  . Macrodantin (Nitrofurantoin Macrocrystal) Other (See Comments)    "broke me out in big old knots all over my body; had to go to ER"  . Penicillins Other (See Comments)    "makes me real weak when I take it; like I'll pass out"  . Plavix (Clopidogrel Bisulfate) Rash  . Bactrim (Sulfamethoxazole W-Trimethoprim) Rash  . Sulfa Antibiotics Rash  . Venofer (Ferric Oxide) Itching    Patient reports using Benadryl prior to doses as [place]:   Reviewed in patient's chart    Physical Exam:   BP 117/61  Pulse 82  Temp 98.7 F (37.1 C) (Oral)  Resp 18  Ht 5\' 1"  (1.549 m)  Wt 61.1 kg (134 lb 11.2 oz)  BMI 25.45 kg/m2  SpO2 96%  General:    well-appearing  HEENT:  Unremarkable   Neck:   no JVD, no bruits, no adenopathy   Chest:   clear to auscultation, symmetrical breath sounds, no wheezes, no rhonchi  Sternum:  Sternotomy incision is clean and dry and healing nicely. Sternum is stable on palpation. The patient's symptoms of chest pain reproduced with palpation of her lower sternum and rib cage.   CV:   RRR, no murmur   Abdomen:  soft, non-tender, no masses   Extremities:  warm, well-perfused, pulses not palpable, LLE saphenous vein incisions healing nicely  Rectal/GU  Deferred  Neuro:   Grossly non-focal and symmetrical throughout  Skin:   Clean and dry, no rashes, no breakdown  Diagnostic Tests:   *RADIOLOGY REPORT*  Clinical Data: Right-sided chest pain, cough  CHEST - 2 VIEW  Comparison: Portable chest x-ray of 07/03/2012  Findings: Aeration of the lungs has improved. There do appear to  be small effusions blunting the posterior  costophrenic angles and  left basilar linear atelectasis remains. Cardiomegaly is stable.  No pneumothorax is seen. Median sternotomy sutures are noted.  IMPRESSION:  Improved aeration with only tiny effusions and mild left basilar  linear atelectasis.  Original Report Authenticated By: Ivar Drape, M.D.    Impression:  The patient's chest pain is exacerbated by coughing and reproducible with palpation of her lower sternum and chest wall.  Her sternum is stable and the sternotomy incision is healing nicely. Chest x-ray is clear.  Plan:  I feel the patient's chest pain is clearly musculoskeletal in origin and exacerbated by her persistent cough. I would focus on measures to alleviate her persistent cough.    Valentina Gu. Roxy Manns, MD 07/05/2012 2:02 PM

## 2012-07-06 DIAGNOSIS — R05 Cough: Secondary | ICD-10-CM | POA: Diagnosis present

## 2012-07-06 DIAGNOSIS — R059 Cough, unspecified: Secondary | ICD-10-CM | POA: Diagnosis present

## 2012-07-06 LAB — CBC
HCT: 34.2 % — ABNORMAL LOW (ref 36.0–46.0)
Hemoglobin: 10.8 g/dL — ABNORMAL LOW (ref 12.0–15.0)
MCHC: 31.6 g/dL (ref 30.0–36.0)
RDW: 20.8 % — ABNORMAL HIGH (ref 11.5–15.5)
WBC: 10.2 10*3/uL (ref 4.0–10.5)

## 2012-07-06 LAB — HEPARIN LEVEL (UNFRACTIONATED): Heparin Unfractionated: <0.1 IU/mL — ABNORMAL LOW (ref 0.30–0.70)

## 2012-07-06 MED ORDER — HYDROCOD POLST-CHLORPHEN POLST 10-8 MG/5ML PO LQCR
5.0000 mL | Freq: Two times a day (BID) | ORAL | Status: DC
  Administered 2012-07-06: 5 mL via ORAL
  Filled 2012-07-06 (×2): qty 5

## 2012-07-06 MED ORDER — ALBUTEROL SULFATE (5 MG/ML) 0.5% IN NEBU
2.5000 mg | INHALATION_SOLUTION | Freq: Four times a day (QID) | RESPIRATORY_TRACT | Status: DC
  Administered 2012-07-06 – 2012-07-08 (×5): 2.5 mg via RESPIRATORY_TRACT
  Filled 2012-07-06 (×7): qty 0.5

## 2012-07-06 NOTE — Progress Notes (Signed)
Patient ID: Shawna [name], female   DOB: 1940/07/21, 71 y.o.   MRN: [id number]  [facility] Progress Note    Subjective:   Pt still complaining of cough keeping her up and causing more chest discomfort   Objective:   BP 134/41  Pulse 97  Temp 99.3 F (37.4 C) (Oral)  Resp 18  Ht 5\' 1"  (1.549 m)  Wt 59.7 kg (131 lb 9.8 oz)  BMI 24.87 kg/m2  SpO2 97%  Physical Exam: Gen:WD WN elderly AAF in NAD CVS:no rub Resp:decreased air movement [id number] Ext:no edema, LUE AVF +T/B  Labs: BMET  Lab 07/05/12 1535 07/05/12 0500 07/04/12 0440 07/03/12 1504  NA 137 140 136 132*  K 4.0 4.1 4.3 4.9  CL 96 98 95* 92*  CO2 26 28 26 26   GLUCOSE 146* 121* 199* 105*  BUN 53* 47* 23 52*  CREATININE 8.52* 7.82* 4.76* 8.39*  ALBUMIN 2.8* -- 3.1* 2.5*  CALCIUM 9.3 9.8 10.3 9.5  PHOS 4.6 -- -- 5.0*   CBC  Lab 07/06/12 0500 07/05/12 1535 07/05/12 0500 07/04/12 0440  WBC 10.2 11.3* 13.7* 7.1  NEUTROABS -- -- -- --  HGB 10.8* 9.4* 9.6* 11.3*  HCT 34.2* 29.8* 30.1* 35.9*  MCV 91.4 90.9 90.9 90.2  PLT 456* 477* 497* 565*    @IMGRELPRIORS @ Medications:      . amLODipine  10 mg Oral QHS  . aspirin  81 mg Oral Daily  . atorvastatin  10 mg Oral QPC supper  . cloNIDine  0.1 mg Oral BID  . darbepoetin (ARANESP) injection - DIALYSIS  100 mcg Intravenous Q Tue-HD  . dextromethorphan  15 mg Oral BID  . diphenhydrAMINE  50 mg Oral Once  . ferric gluconate (FERRLECIT/NULECIT) IV  125 mg Intravenous Q T,Th,Sa-HD  . fluticasone  2 spray Each Nare Daily  . lanthanum  1,000 mg Oral TID PC  . metoprolol tartrate  25 mg Oral BID  . multivitamin  1 tablet Oral Daily  . pantoprazole  40 mg Oral BID AC  . Ticagrelor  90 mg Oral BID     Assessment/ Plan:   1. Cough and chest wall pain- was receiving nebulizers at the nursing home prior to admission and may benefit from resumption of these.  Also will need more aggressive cough suppression as this is the main reason for her admission  and delay in discharge.  Recommend tussionex and nebs if ok with primary team 2. ESRD- off schedule.  Normally MWF but had more HD to help with UF to see if this would improve her cough and resp status.  Below her EDW.  Plan for HD tomorrow first shift in hopes that she will be ready for discharge afterwards. 3. Anemia:cont with esa 4. CAD s/p CABG- non cardiac CP 5. Nutrition:cont to encourage po intake 6. Hypertension:stable 7. dispo- hopeful discharge tomorrow after HD if cough improves with nebs and tussinex.  Shawna [name] A 07/06/2012, 10:19 AM

## 2012-07-06 NOTE — Progress Notes (Signed)
Patient ID: Shawna [name], female   DOB: 02/08/1941, 71 y.o.   MRN: [id number]   SUBJECTIVE:  The patient continues to improve. Appreciate help from everyone. It is becoming clear since admission that her cough has caused her chest pain and that this is a key issue. Dialysis is continuing. Appreciate Dr. Ambrose Pancoast Adjusting her meds for cough. Hopefully her cough will be improving. She has dialysis ordered for tomorrow morning. Hopefully she'll be able to go home after that if everything is improved. The patient has also been assessed by Dr. Roxy Manns and feels that her post operative status is stable.   Filed Vitals:   07/05/12 1905 07/05/12 2132 07/06/12 0632 07/06/12 0821  BP: 158/64 147/55 149/73 134/41  Pulse: 97 97 97   Temp: 98.8 F (37.1 C) 98.9 F (37.2 C) 99.3 F (37.4 C)   TempSrc: Oral Oral Oral   Resp: 17 18 18    Height:      Weight: 132 lb 7.9 oz (60.1 kg)  131 lb 9.8 oz (59.7 kg)   SpO2: 98% 97% 97%     Intake/Output Summary (Last 24 hours) at 07/06/12 1107 Last data filed at 07/06/12 0826  Gross per 24 hour  Intake    480 ml  Output   2250 ml  Net  -1770 ml    LABS: Basic Metabolic Panel:  Basename 07/05/12 1535 07/05/12 0500 07/03/12 1504  NA 137 140 --  K 4.0 4.1 --  CL 96 98 --  CO2 26 28 --  GLUCOSE 146* 121* --  BUN 53* 47* --  CREATININE 8.52* 7.82* --  CALCIUM 9.3 9.8 --  MG -- -- --  PHOS 4.6 -- 5.0*   Liver Function Tests:  Basename 07/05/12 1535 07/04/12 0440 07/03/12 1504  AST -- 13 13  ALT -- 8 6  ALKPHOS -- 85 68  BILITOT -- 0.2* 0.2*  PROT -- 7.0 5.5*  ALBUMIN 2.8* 3.1* --   No results found for this basename: LIPASE:2,AMYLASE:2 in the last 72 hours CBC:  Basename 07/06/12 0500 07/05/12 1535  WBC 10.2 11.3*  NEUTROABS -- --  HGB 10.8* 9.4*  HCT 34.2* 29.8*  MCV 91.4 90.9  PLT 456* 477*   Cardiac Enzymes: No results found for this basename: CKTOTAL:3,CKMB:3,CKMBINDEX:3,TROPONINI:3 in the last 72 hours BNP: No components found  with this basename: POCBNP:3 D-Dimer: No results found for this basename: DDIMER:2 in the last 72 hours Hemoglobin A1C: No results found for this basename: HGBA1C in the last 72 hours Fasting Lipid Panel: No results found for this basename: CHOL,HDL,LDLCALC,TRIG,CHOLHDL,LDLDIRECT in the last 72 hours Thyroid Function Tests: No results found for this basename: TSH,T4TOTAL,FREET3,T3FREE,THYROIDAB in the last 72 hours  RADIOLOGY: Dg Chest 2 View  07/04/2012  *RADIOLOGY REPORT*  Clinical Data: Right-sided chest pain, cough  CHEST - 2 VIEW  Comparison: Portable chest x-ray of 07/03/2012  Findings: Aeration of the lungs has improved.  There do appear to be small effusions blunting the posterior costophrenic angles and left basilar linear atelectasis remains.  Cardiomegaly is stable. No pneumothorax is seen.  Median sternotomy sutures are noted.  IMPRESSION: Improved aeration with only tiny effusions and mild left basilar linear atelectasis.   Original Report Authenticated By: Ivar Drape, M.D.    Dg Chest 2 View  06/09/2012  *RADIOLOGY REPORT*  Clinical Data: Abnormal chest x-ray follow-up. Rule out pneumonia.  CHEST - 2 VIEW  Comparison: One-view chest 06/07/2012.  Findings: The heart is mildly enlarged.  There is no edema or  effusion to suggest failure. Right lower lobe pneumonia is suspected with new right lower lobe airspace consolidation since the exam of 05/30/2012.  The visualized soft tissues and bony thorax are unremarkable.  IMPRESSION:  1.  Right lower lobe pneumonia. 2.  Mild cardiomegaly without failure.   Original Report Authenticated By: San Morelle, M.D.    Nm Pulmonary Perf And Vent  07/04/2012  *RADIOLOGY REPORT*  Clinical Data:  Chest pain.  Status post CABG 3 weeks ago.  NUCLEAR MEDICINE VENTILATION - PERFUSION LUNG SCAN  Technique:  Ventilation images were obtained in multiple projections using inhaled aerosol technetium 99 M DTPA.  Perfusion images were obtained in  multiple projections after intravenous injection of Tc-53m MAA.  Radiopharmaceuticals:  68mCi Tc-50m DTPA aerosol and 6.0 mCi Tc-56m MAA.  Comparison: PA and lateral chest 07/04/2012.  Findings:  Ventilation:   No focal ventilation defect.  Perfusion:   No wedge shaped peripheral perfusion defects to suggest acute pulmonary embolism  IMPRESSION: Negative exam.   Original Report Authenticated By: Orlean Patten, M.D.    Dg Chest Port 1 View  06/17/2012  *RADIOLOGY REPORT*  Clinical Data: Short of breath  PORTABLE CHEST - 1 VIEW  Comparison: 06/16/2012  Findings: Moderate bilateral effusions with associated atelectasis are stable.  No evidence of pulmonary edema.  No pneumothorax.  Changes from cardiac surgery are stable.  The cardiac silhouette is mildly enlarged.  Right internal jugular Cordis remains well positioned.  IMPRESSION: No change from previous day's study.   Original Report Authenticated By: Lajean Manes, M.D.    Dg Chest Port 1 View  06/16/2012  *RADIOLOGY REPORT*  Clinical Data: Bypass surgery.  PORTABLE CHEST - 1 VIEW  Comparison: 06/15/2012.  Findings: The right IJ Cordis is in stable position.  The heart remains enlarged.  Persistent bilateral pleural effusions, bibasilar atelectasis and mild central vascular congestion.  IMPRESSION: Stable chest x-ray findings.   Original Report Authenticated By: Marijo Sanes, M.D.    Dg Chest Portable 1 View In Am  06/15/2012  *RADIOLOGY REPORT*  Clinical Data: Postop CABG  PORTABLE CHEST - 1 VIEW  Comparison: 06/14/2012; 06/13/2012  Findings: Grossly unchanged enlarged cardiac silhouette and mediastinal contours post median sternotomy and CABG.  Interval removal of right jugular approach PA catheter with remaining vascular sheath tip projected over the mid SVC.  Interval removal of left-sided chest tube and mediastinal drain.  No definite pneumothorax.  Small bilateral effusions and bibasilar heterogeneous opacities are unchanged.  Mild pulmonary venous  congestion without frank evidence of edema.  Unchanged bones.  IMPRESSION: 1.  Interval removal of support apparatus as above.  No pneumothorax. 2.  Pulmonary congestion without frank evidence of edema. 3.  Grossly unchanged small bilateral effusions and bibasilar opacities, likely atelectasis.   Original Report Authenticated By: Jake Seats, MD    Dg Chest Portable 1 View In Am  06/14/2012  *RADIOLOGY REPORT*  Clinical Data: Status post coronary artery bypass graft.  PORTABLE CHEST - 1 VIEW  Comparison: 1 day prior  Findings: Extubation.  Removal of nasogastric tube.  Mediastinal drain and left-sided chest tube remain in place.  Mild cardiomegaly.  Suspect small layering bilateral pleural effusions. No pneumothorax.  Diminished lung volumes with new or increased mild pulmonary venous congestion.  Decreased bibasilar aeration with increased atelectasis.  IMPRESSION:  1.  Extubation, with slightly decreased aeration; increased bibasilar atelectasis and development of mild pulmonary venous congestion. 2.  Left chest tube remaining in place, without pneumothorax. 3.  Suspect trace bilateral  pleural effusions.   Original Report Authenticated By: Abigail Miyamoto, M.D.    Dg Chest Portable 1 View  06/13/2012  *RADIOLOGY REPORT*  Clinical Data: Status post CABG.  PORTABLE CHEST - 1 VIEW  Comparison: 06/09/2012  Findings: Endotracheal tube tip lies approximately 3 cm above the carina.  Swan-Ganz catheter tip is in the proximal right pulmonary artery.  A left chest tube is present without pneumothorax.  Left lower lobe atelectasis present as well as scattered subsegmental atelectasis in both lungs.  No evidence of overt pulmonary edema or significant pleural effusions.  The heart size and mediastinal contours are within normal limits.  IMPRESSION: Scattered atelectasis bilaterally.  No pneumothorax identified.   Original Report Authenticated By: Aletta Edouard, M.D.     PHYSICAL EXAM  Patient is oriented to person time  and place. Affect is normal. Her husband is in the room. There is no jugulovenous distention. Lungs reveal scattered rhonchi. Cardiac exam reveals S1 and S2. There no clicks or significant murmurs. The abdomen is soft. There is no significant peripheral edema.   TELEMETRY:  I have reviewed telemetry today July 06, 2012. There is normal sinus rhythm.   ASSESSMENT AND PLAN:   *Chest wall pain    It is now clear that her pain is related to her cough. Meds have been changed for this. No further cardiac workup.   Essential hypertension, benign   ESRD (end stage renal disease)   She will have dialysis again tomorrow morning and then may be ready to go home.    Hx of CABG   Patient is now stable after recent bypass surgery.   Carotid artery disease   Cough     Meds have been adjusted for her cough. Hopefully this will improve.  Dola Argyle 07/06/2012 11:07 AM

## 2012-07-07 DIAGNOSIS — D631 Anemia in chronic kidney disease: Secondary | ICD-10-CM

## 2012-07-07 DIAGNOSIS — N189 Chronic kidney disease, unspecified: Secondary | ICD-10-CM

## 2012-07-07 LAB — CBC
HCT: 32 % — ABNORMAL LOW (ref 36.0–46.0)
Platelets: 395 10*3/uL (ref 150–400)
RBC: 3.54 MIL/uL — ABNORMAL LOW (ref 3.87–5.11)
RDW: 20.1 % — ABNORMAL HIGH (ref 11.5–15.5)
WBC: 10.5 10*3/uL (ref 4.0–10.5)

## 2012-07-07 LAB — LIPID PANEL
Cholesterol: 129 mg/dL (ref 0–200)
HDL: 51 mg/dL (ref >39)
Triglycerides: 97 mg/dL (ref <150)

## 2012-07-07 LAB — RENAL FUNCTION PANEL
Albumin: 2.8 g/dL — ABNORMAL LOW (ref 3.5–5.2)
CO2: 26 mEq/L (ref 19–32)
Chloride: 93 mEq/L — ABNORMAL LOW (ref 96–112)
GFR calc non Af Amer: 5 mL/min — ABNORMAL LOW (ref >90)
Potassium: 3.8 mEq/L (ref 3.5–5.1)

## 2012-07-07 MED ORDER — NEPRO/CARBSTEADY PO LIQD: 237.0000 mL | ORAL | Status: DC | PRN

## 2012-07-07 MED ORDER — HYDROCHLOROTHIAZIDE 12.5 MG PO CAPS
12.5000 mg | ORAL_CAPSULE | Freq: Every day | ORAL | Status: DC
  Administered 2012-07-07: 12.5 mg via ORAL
  Filled 2012-07-07 (×2): qty 1

## 2012-07-07 MED ORDER — HYDROCOD POLST-CHLORPHEN POLST 10-8 MG/5ML PO LQCR
5.0000 mL | Freq: Three times a day (TID) | ORAL | Status: DC
  Administered 2012-07-07 – 2012-07-12 (×16): 5 mL via ORAL
  Filled 2012-07-07 (×15): qty 5

## 2012-07-07 MED ORDER — SODIUM CHLORIDE 0.9 % IV SOLN: 100.0000 mL | INTRAVENOUS | Status: DC | PRN

## 2012-07-07 MED ORDER — LIDOCAINE HCL (PF) 1 % IJ SOLN: 5.0000 mL | INTRAMUSCULAR | Status: DC | PRN

## 2012-07-07 MED ORDER — ALTEPLASE 2 MG IJ SOLR: 2.0000 mg | Freq: Once | INTRAMUSCULAR | Status: DC | PRN

## 2012-07-07 MED ORDER — HEPARIN SODIUM (PORCINE) 1000 UNIT/ML DIALYSIS: 1000.0000 [IU] | INTRAMUSCULAR | Status: DC | PRN

## 2012-07-07 MED ORDER — PENTAFLUOROPROP-TETRAFLUOROETH EX AERO: 1.0000 "application " | INHALATION_SPRAY | CUTANEOUS | Status: DC | PRN

## 2012-07-07 MED ORDER — LOSARTAN POTASSIUM 50 MG PO TABS
100.0000 mg | ORAL_TABLET | Freq: Every day | ORAL | Status: DC
  Administered 2012-07-10 – 2012-07-12 (×3): 100 mg via ORAL
  Filled 2012-07-07 (×6): qty 2

## 2012-07-07 MED ORDER — HEPARIN SODIUM (PORCINE) 1000 UNIT/ML DIALYSIS: 20.0000 [IU]/kg | INTRAMUSCULAR | Status: DC | PRN

## 2012-07-07 MED ORDER — BENZONATATE 100 MG PO CAPS
200.0000 mg | ORAL_CAPSULE | Freq: Three times a day (TID) | ORAL | Status: DC
  Administered 2012-07-07 – 2012-07-12 (×15): 200 mg via ORAL
  Filled 2012-07-07 (×17): qty 2

## 2012-07-07 MED ORDER — LIDOCAINE-PRILOCAINE 2.5-2.5 % EX CREA: 1.0000 "application " | TOPICAL_CREAM | CUTANEOUS | Status: DC | PRN

## 2012-07-07 NOTE — Progress Notes (Signed)
Patient ID: Shawna [name], female   DOB: 05-12-1941, 71 y.o.   MRN: [id number]  [facility] Progress Note    Subjective:   Still c/o cough    Objective:   BP 159/65  Pulse 98  Temp 99.6 F (37.6 C) (Oral)  Resp 17  Ht 5\' 1"  (1.549 m)  Wt 60.4 kg (133 lb 2.5 oz)  BMI 25.16 kg/m2  SpO2 96%  Physical Exam: Gen:WD elderly AAF in NAD CVS:no rub Resp:CTA [id number] Ext:no edema  Labs: BMET  Lab 07/05/12 1535 07/05/12 0500 07/04/12 0440 07/03/12 1504  NA 137 140 136 132*  K 4.0 4.1 4.3 4.9  CL 96 98 95* 92*  CO2 26 28 26 26   GLUCOSE 146* 121* 199* 105*  BUN 53* 47* 23 52*  CREATININE 8.52* 7.82* 4.76* 8.39*  ALBUMIN 2.8* -- 3.1* 2.5*  CALCIUM 9.3 9.8 10.3 9.5  PHOS 4.6 -- -- 5.0*   CBC  Lab 07/07/12 0815 07/06/12 0500 07/05/12 1535 07/05/12 0500  WBC 10.5 10.2 11.3* 13.7*  NEUTROABS -- -- -- --  HGB 10.4* 10.8* 9.4* 9.6*  HCT 32.0* 34.2* 29.8* 30.1*  MCV 90.4 91.4 90.9 90.9  PLT 395 456* 477* 497*    @IMGRELPRIORS @ Medications:      . albuterol  2.5 mg Nebulization Q6H  . amLODipine  10 mg Oral QHS  . aspirin  81 mg Oral Daily  . atorvastatin  10 mg Oral QPC supper  . chlorpheniramine-HYDROcodone  5 mL Oral Q12H  . cloNIDine  0.1 mg Oral BID  . darbepoetin (ARANESP) injection - DIALYSIS  100 mcg Intravenous Q Tue-HD  . diphenhydrAMINE  50 mg Oral Once  . ferric gluconate (FERRLECIT/NULECIT) IV  125 mg Intravenous Q T,Th,Sa-HD  . fluticasone  2 spray Each Nare Daily  . lanthanum  1,000 mg Oral TID PC  . metoprolol tartrate  25 mg Oral BID  . multivitamin  1 tablet Oral Daily  . pantoprazole  40 mg Oral BID AC  . Ticagrelor  90 mg Oral BID     Assessment/ Plan:   1. Cough and chest wall pain- some relief with reinstitution of nebulizers and tussionex but still complaining of cough. Further plans per primary team 2. ESRD- off schedule. Normally MWF but had more HD to help with UF to see if this would improve her cough and resp status.  Below her EDW. Seen on HD today in hopes that she will be ready for discharge later today.  She will need to go to her outpt appointment Sunday if she is discharged today or Tuesday if d/c is delayed. 3. Anemia:cont with esa 4. CAD s/p CABG- non cardiac CP 5. Nutrition:cont to encourage po intake 6. Hypertension:stable 7. dispo- hopeful discharge today after HD if cough improves with nebs and tussinex.  Jettson Crable A 07/07/2012, 8:32 AM

## 2012-07-07 NOTE — Procedures (Signed)
Patient was seen on dialysis and the procedure was supervised. BFR 400 Via AVF BP is 139/63.  Patient appears to be tolerating treatment well

## 2012-07-07 NOTE — Progress Notes (Signed)
[name]  71 y.o.  female  Subjective: Paroxysms of cough lasting up to one half hour and resulting in substantial incisional chest discomfort. Mildly productive with recent change in color from clear to yellow. No associated fever or chills but maximal temperature in hospital has been 37.8.  Allergy: Aspirin; Contrast media; Iron; Macrodantin; Penicillins; Plavix; Bactrim; Sulfa antibiotics; and Venofer  Objective: Vital signs in last 24 hours: Temp:  [98.1 F (36.7 C)-100 F (37.8 C)] 99.1 F (37.3 C) (12/28 1338) Pulse Rate:  [92-110] 96  (12/28 1213) Resp:  [16-28] 18  (12/28 1338) BP: (80-190)/(34-93) 139/74 mmHg (12/28 1338) SpO2:  [96 %-100 %] 96 % (12/28 0515) Weight:  [58.9 kg (129 lb 13.6 oz)-60.5 kg (133 lb 6.1 oz)] 58.9 kg (129 lb 13.6 oz) (12/28 1213)  58.9 kg (129 lb 13.6 oz) Body mass index is 24.54 kg/(m^2).  Weight change: -3.3 kg (-7 lb 4.4 oz) Last BM Date: 07/03/12  Intake/Output from previous day: 12/27 0701 - 12/28 0700 In: 3 [P.O.:480] Out: -   General- Well developed; no acute distress; proportionate weight and height  Neck- No JVD, no carotid bruits Lungs- clear lung fields; normal I:E ratio Cardiovascular- normal PMI; normal S1 and S2; modest systolic ejection murmur Abdomen- normal bowel sounds; soft and non-tender without masses or organomegaly Skin- Warm, no significant lesions Extremities- Nl distal pulses; no edema  Lab Results: Cardiac Markers:  No results found for this basename: TROPONINI:2,CK,MB:2 in the last 72 hours CBC:   Basename 07/07/12 0815 07/06/12 0500  WBC 10.5 10.2  HGB 10.4* 10.8*  HCT 32.0* 34.2*  PLT 395 456*   BMET:  Basename 07/07/12 0815 07/05/12 1535  NA 135 137  K 3.8 4.0  CL 93* 96  CO2 26 26  GLUCOSE 172* 146*  BUN 42* 53*  CREATININE 7.86* 8.52*  CALCIUM 9.4 9.3   Hepatic Function:   Basename 07/07/12 0815  PROT --  ALBUMIN 2.8*  AST --  ALT --  ALKPHOS --  BILITOT --  BILIDIR --  IBILI --    EKG:  07/03/2012: Normal sinus rhythm; left atrial abnormality; LVH with repolarization abnormality; delayed R-wave progression-cannot exclude previous anterior MI.  Imaging: Imaging results have been reviewed.  Chest x-ray shows no evidence for pneumonia or congestive heart failure other than minimal bilateral effusions, likely the result of fairly recent CABG surgery.  Medications: I have reviewed the patient's current medications.  Assessment/Plan: Cough:  Appears to be residual symptoms from a recent URI. Benzoate will be added to her medical regime and narcotic antitussives administered around the clock. She will require adequate medication for pain at discharge, which hopefully will occur tomorrow.  Anemia: Likely related to end-stage renal disease plus blood loss at CABG 3 weeks ago.  Hypertension:  Inadequately controlled. Metoprolol will be discontinued and losartan plus diuretic added.  Hyperlipidemia: Most recent profile available is from 12/2011 at which time management is suboptimal. A repeat study will be obtained.  LOS: 4 days   Jacqulyn Ducking 07/07/2012, 2:35 PM

## 2012-07-08 ENCOUNTER — Observation Stay (HOSPITAL_COMMUNITY): Payer: Medicare Other

## 2012-07-08 LAB — CBC WITH DIFFERENTIAL/PLATELET
Basophils Relative: 0 % (ref 0–1)
Eosinophils Absolute: 1.8 10*3/uL — ABNORMAL HIGH (ref 0.0–0.7)
Eosinophils Relative: 19 % — ABNORMAL HIGH (ref 0–5)
Lymphocytes Relative: 7 % — ABNORMAL LOW (ref 12–46)
Monocytes Absolute: 1.3 10*3/uL — ABNORMAL HIGH (ref 0.1–1.0)
Monocytes Relative: 14 % — ABNORMAL HIGH (ref 3–12)
Neutrophils Relative %: 60 % (ref 43–77)
Platelets: 323 10*3/uL (ref 150–400)
RBC: 3.31 MIL/uL — ABNORMAL LOW (ref 3.87–5.11)
WBC: 9.5 10*3/uL (ref 4.0–10.5)

## 2012-07-08 IMAGING — CR DG CHEST 2V
2 series · 2 of 2 positions shown · non-contrast
Comparison: [DATE]

CLINICAL DATA: Cough.  Fever and chest pain.

CHEST - 2 VIEW

[w chest lat]
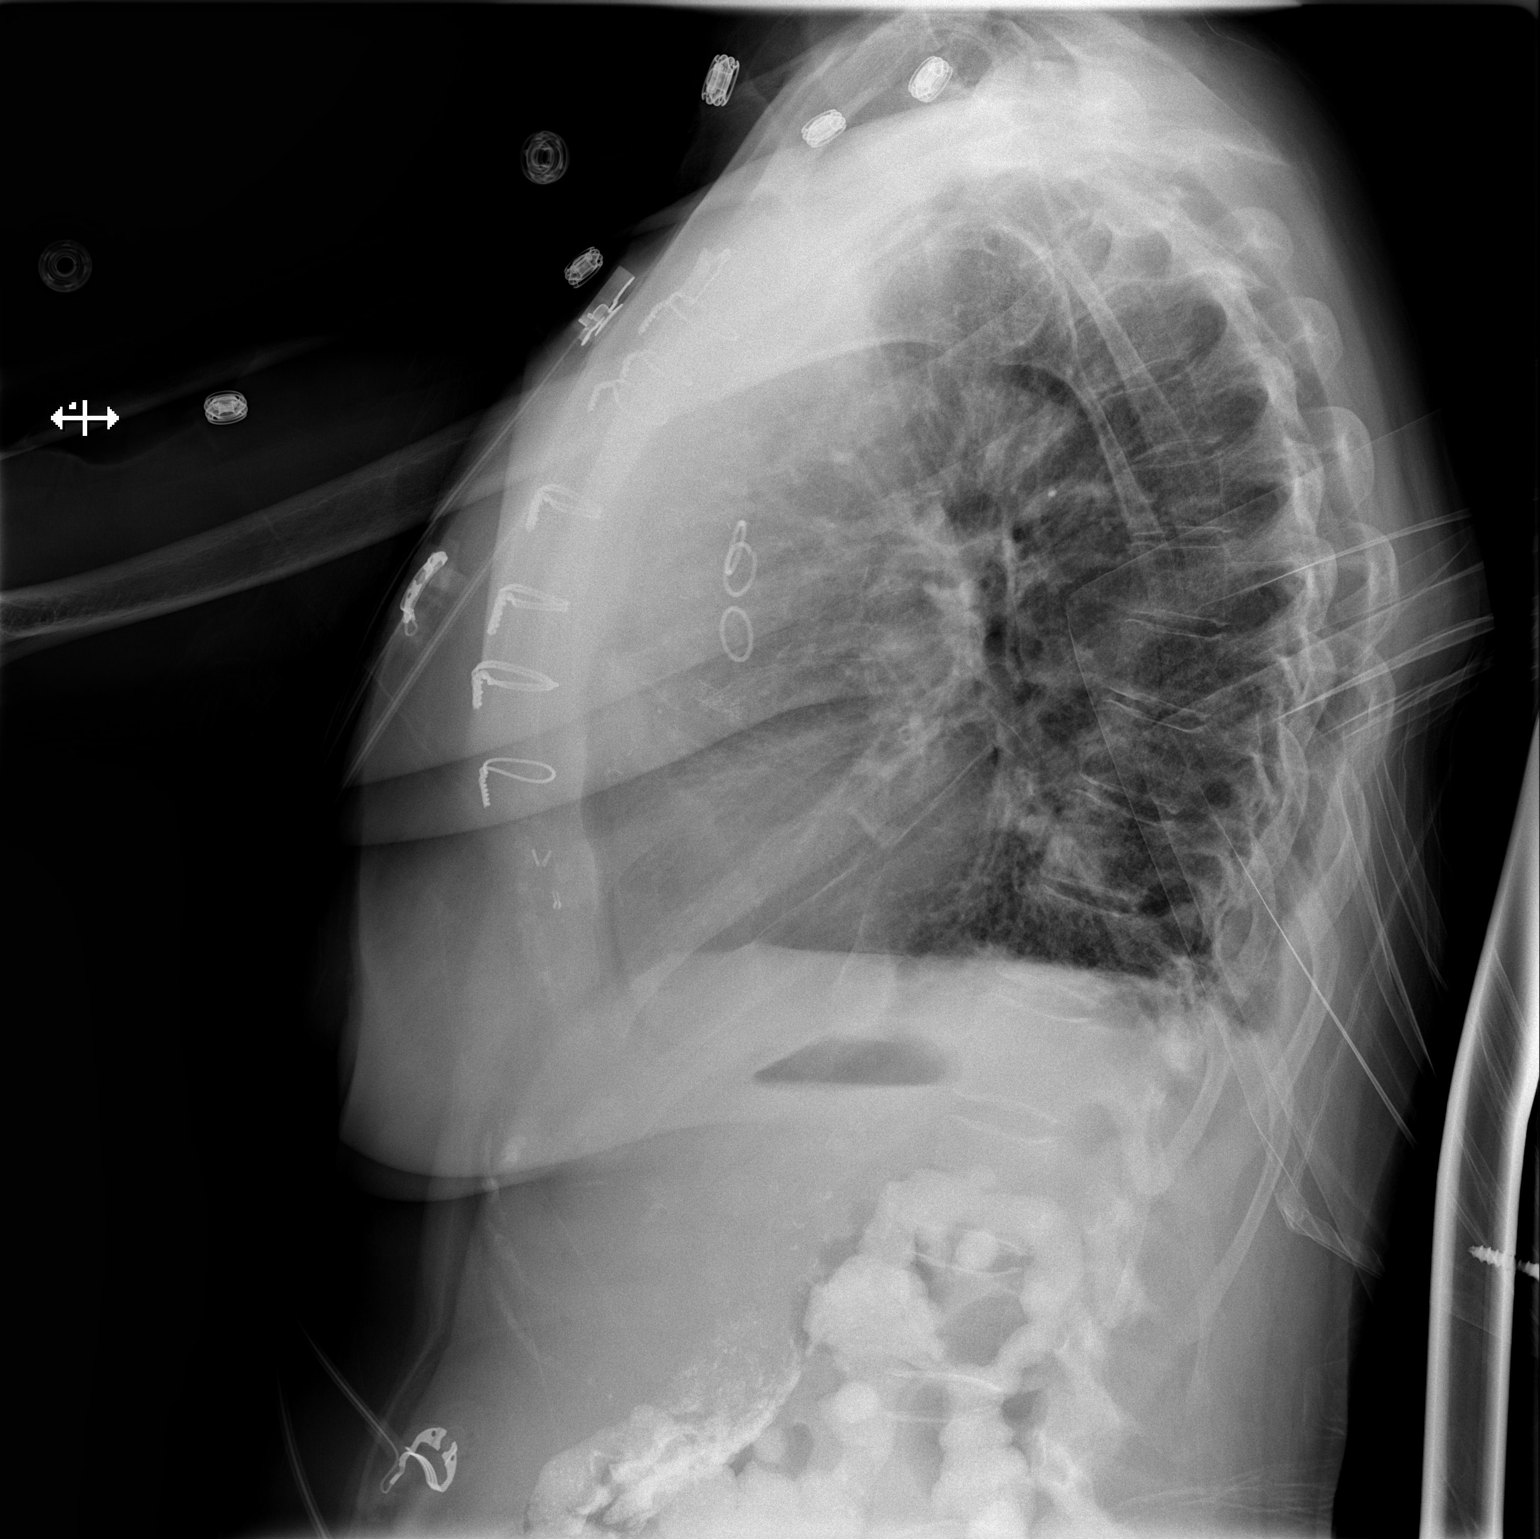

[x chest ap]
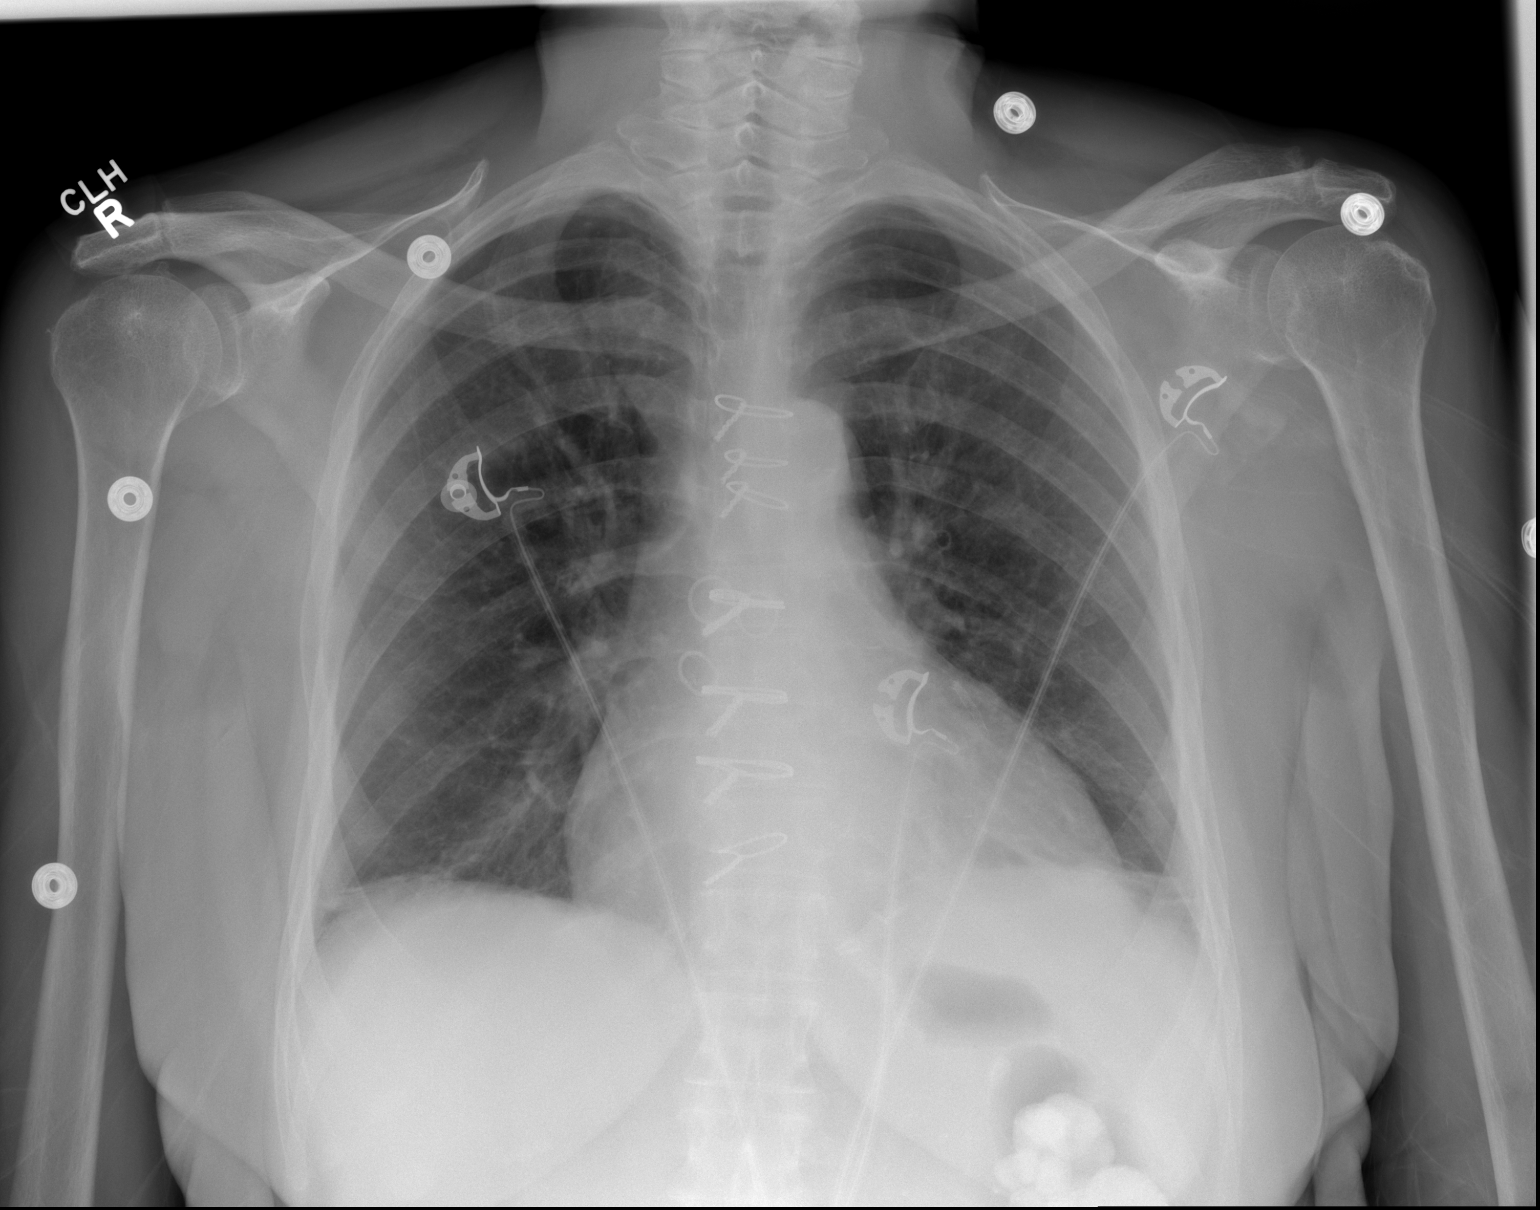

[2 of 2 positions shown; findings below may reference images not displayed]

FINDINGS: Status post median sternotomy and CABG procedure. Small
pleural  effusions appears similar to previous exam.  Bibasilar
atelectasis noted in the lung bases.  There are no airspace
consolidation identified.
IMPRESSION: Persistent small pleural effusions and bibasilar atelectasis.

## 2012-07-08 MED ORDER — COLCHICINE 0.6 MG PO TABS
0.6000 mg | ORAL_TABLET | Freq: Every day | ORAL | Status: DC
  Administered 2012-07-08 – 2012-07-12 (×4): 0.6 mg via ORAL
  Filled 2012-07-08 (×5): qty 1

## 2012-07-08 MED ORDER — ALBUTEROL SULFATE (5 MG/ML) 0.5% IN NEBU
2.5000 mg | INHALATION_SOLUTION | Freq: Four times a day (QID) | RESPIRATORY_TRACT | Status: DC | PRN
  Administered 2012-07-11: 2.5 mg via RESPIRATORY_TRACT
  Filled 2012-07-08: qty 0.5

## 2012-07-08 MED ORDER — CLONIDINE HCL 0.2 MG/24HR TD PTWK
0.2000 mg | MEDICATED_PATCH | TRANSDERMAL | Status: DC
  Administered 2012-07-08: 0.2 mg via TRANSDERMAL
  Filled 2012-07-08: qty 1

## 2012-07-08 NOTE — Progress Notes (Signed)
Patient ID: Shawna [name], female   DOB: 1940-11-15, 71 y.o.   MRN: [id number]  [facility] Progress Note    Subjective:   Still complaining of chest pain   Objective:   BP 133/71  Pulse 98  Temp 98.5 F (36.9 C) (Oral)  Resp 18  Ht 5\' 1"  (1.549 m)  Wt 59.285 kg (130 lb 11.2 oz)  BMI 24.70 kg/m2  SpO2 94%  Physical Exam: Gen:WD elderly AAF in NAD CVS:no rub Resp:CTA [id number] Ext:no edema  Labs: BMET  Lab 07/07/12 0815 07/05/12 1535 07/05/12 0500 07/04/12 0440 07/03/12 1504  NA 135 137 140 136 132*  K 3.8 4.0 4.1 4.3 4.9  CL 93* 96 98 95* 92*  CO2 26 26 28 26 26   GLUCOSE 172* 146* 121* 199* 105*  BUN 42* 53* 47* 23 52*  CREATININE 7.86* 8.52* 7.82* 4.76* 8.39*  ALBUMIN 2.8* 2.8* -- 3.1* 2.5*  CALCIUM 9.4 9.3 9.8 10.3 9.5  PHOS 5.0* 4.6 -- -- 5.0*   CBC  Lab 07/07/12 0815 07/06/12 0500 07/05/12 1535 07/05/12 0500  WBC 10.5 10.2 11.3* 13.7*  NEUTROABS -- -- -- --  HGB 10.4* 10.8* 9.4* 9.6*  HCT 32.0* 34.2* 29.8* 30.1*  MCV 90.4 91.4 90.9 90.9  PLT 395 456* 477* 497*    @IMGRELPRIORS @ Medications:      . albuterol  2.5 mg Nebulization Q6H  . amLODipine  10 mg Oral QHS  . aspirin  81 mg Oral Daily  . atorvastatin  10 mg Oral QPC supper  . benzonatate  200 mg Oral TID  . chlorpheniramine-HYDROcodone  5 mL Oral TID  . cloNIDine  0.1 mg Oral BID  . darbepoetin (ARANESP) injection - DIALYSIS  100 mcg Intravenous Q Tue-HD  . ferric gluconate (FERRLECIT/NULECIT) IV  125 mg Intravenous Q T,Th,Sa-HD  . fluticasone  2 spray Each Nare Daily  . hydrochlorothiazide  12.5 mg Oral Daily  . lanthanum  1,000 mg Oral TID PC  . losartan  100 mg Oral Daily  . multivitamin  1 tablet Oral Daily  . pantoprazole  40 mg Oral BID AC  . Ticagrelor  90 mg Oral BID     Assessment/ Plan:   1. Cough and chest wall pain- some relief with reinstitution of nebulizers and tussionex but still complaining of cough. Further plans per primary team 2. ESRD- off  schedule. Normally MWF but had more HD to help with UF to see if this would improve her cough and resp status. Below her EDW. If she is for discharge tomorrow, she will need to go to her outpt appointment Tuesday at her home unit as they are also on a holiday schedule. 3. Anemia:cont with esa 4. CAD s/p CABG- non cardiac CP 5. Nutrition:cont to encourage po intake 6. Hypertension:improves after HD but was spiking above 180.  Agree with ARB but would prefer longer acting agent such as Benicar or Micardis but will defer to cardiology. 7. dispo- hopeful discharge tomorrow  Gilman A 07/08/2012, 8:51 AM

## 2012-07-08 NOTE — Progress Notes (Addendum)
Progress Note  Name:  Shawna [name]  Date:  07/08/2012 8:31 AM   Subjective:  "I feel terrible."  Patient notes improved cough.  But, states she has pain in her chest and back that is made worse with movement and inspiration.  Also has not gotten out of bed and feels week.  Objective:   Vital signs in last 24 hours: Temp:  [98.1 F (36.7 C)-101.8 F (38.8 C)] 98.5 F (36.9 C) (12/29 0434) Pulse Rate:  [92-114] 98  (12/29 0434) Resp:  [16-28] 18  (12/29 0434) BP: (80-187)/(34-81) 133/71 mmHg (12/29 0434) SpO2:  [93 %-98 %] 94 % (12/29 0434) Weight:  [129 lb 13.6 oz (58.9 kg)-130 lb 11.2 oz (59.285 kg)] 130 lb 11.2 oz (59.285 kg) (12/29 0434)  130 lb 11.2 oz (59.285 kg) Body mass index is 24.70 kg/(m^2).  Weight change: -3.5 oz (-0.1 kg) Last BM Date: 07/07/12  Intake/Output from previous day: 12/28 0701 - 12/29 0700 In: 240 [P.O.:240] Out: 1778  Total I&O since admission: -4.5 L  Tele:  Sinus rhythm; sinus tach  PHYSICAL EXAM General: Mildly uncomfortable, especially with when she moves Neck:  No JVD Lungs: Expiratory rhonchi at the right base Heart: normal S1, S2;  Regular rate and rhythm, scratchy early systolic ejection murmur Abdomen: soft, nontender Extremities: no edema Skin:  No rash; warm and dry Psych:  Normal affect  Lab Results: CBC:   Basename 07/07/12 0815 07/06/12 0500  WBC 10.5 10.2  HGB 10.4* 10.8*  HCT 32.0* 34.2*  PLT 395 456*   BMET:  Basename 07/07/12 0815 07/05/12 1535  NA 135 137  K 3.8 4.0  CL 93* 96  CO2 26 26  GLUCOSE 172* 146*  BUN 42* 53*  CREATININE 7.86* 8.52*  CALCIUM 9.4 9.3   Hepatic Function:   Basename 07/07/12 0815  PROT --  ALBUMIN 2.8*  AST --  ALT --  ALKPHOS --  BILITOT --  BILIDIR --  IBILI --   GFR:  Estimated Creatinine Clearance: 5.4 ml/min (by C-G formula based on Cr of 7.86). Lipids:   Basename 07/07/12 0815  CHOL 129  TRIG 97  HDL 51   Radiology/Studies:  Dg Chest 2 View  07/04/2012  IMPRESSION:  Improved aeration with only tiny effusions and mild left basilar linear atelectasis.   Original Report Authenticated By: Ivar Drape, M.D.    Dg Chest 2 View  06/09/2012   IMPRESSION:  1.  Right lower lobe pneumonia. 2.  Mild cardiomegaly without failure.   Original Report Authenticated By: San Morelle, M.D.    Nm Pulmonary Perf And Vent   07/04/2012   IMPRESSION: Negative exam.   Original Report Authenticated By: Orlean Patten, M.D.      Assessment and Plan:  1. Chest Pain:  Related to cough.  No further cardiac workup.  However, now she has worsening pleuritic chest pain.  Also had a temp of 101 last night.  Cough is improved.  She did note some yellow sputum production yesterday.  Workup fever as noted.  Start Colchicine 0.6 mg QD.  If no source of infection found, consider adding prednisone for possible pleuritis.  Continue Tramadol prn for pain. 2. Cough:   With fever, will repeat CXR and get blood Cx's x 2, CBC. 3. Fever:   Will get CXR, Blood cultures, CBC, u/a with culture. 4. CAD s/p CABG:   Continue ASA, Brilinta, atorvastatin. 5. Hypertension:  Reasonable control. 6. ESRD:  Per nephrology. 7. Carotid stenosis:  Outpatient follow up. 8. Disposition:   She will return to [facility].  Will get PT to see her to increase mobility.  If fever resolves and she is improved tomorrow, hopefully she can return to SNF tomorrow.    Signed, Richardson Dopp, PA-C  8:31 AM 07/08/2012     Cardiology Attending Patient interviewed and examined. Discussed with Richardson Dopp, PA-C.  Above note annotated and modified based upon my findings.  Continued issues with pain management and general malaise and weakness. 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 We will increase the intensity of analgesic therapy. I am inclined to treat with a short course of steroids, but in light of recent fever, will  defer that until infection is ruled out. Colchicine to be started in the interim. Do to end stage renal failure and multiple medical problems, she is not a good candidate for treatment with nonsteroidals.  Blood pressure control has been somewhat suboptimal. Clonidine dose will be increased and provided in the transdermal form. Thiazide diuretic discontinued.  Jacqulyn Ducking, MD 07/08/2012, 10:33 AM

## 2012-07-09 ENCOUNTER — Ambulatory Visit: Payer: Medicare Other

## 2012-07-09 LAB — URINALYSIS, ROUTINE W REFLEX MICROSCOPIC
Bilirubin Urine: NEGATIVE
Glucose, UA: NEGATIVE mg/dL
Ketones, ur: NEGATIVE mg/dL
Nitrite: NEGATIVE
Protein, ur: 100 mg/dL — AB
pH: 8 (ref 5.0–8.0)

## 2012-07-09 LAB — CBC
HCT: 31.2 % — ABNORMAL LOW (ref 36.0–46.0)
Hemoglobin: 10.2 g/dL — ABNORMAL LOW (ref 12.0–15.0)
MCH: 29.7 pg (ref 26.0–34.0)
MCHC: 32.7 g/dL (ref 30.0–36.0)

## 2012-07-09 LAB — URINE MICROSCOPIC-ADD ON

## 2012-07-09 LAB — RENAL FUNCTION PANEL
BUN: 49 mg/dL — ABNORMAL HIGH (ref 6–23)
Calcium: 9.5 mg/dL (ref 8.4–10.5)
Glucose, Bld: 102 mg/dL — ABNORMAL HIGH (ref 70–99)
Phosphorus: 4.6 mg/dL (ref 2.3–4.6)

## 2012-07-09 MED ORDER — SODIUM CHLORIDE 0.9 % IV SOLN: 100.0000 mL | INTRAVENOUS | Status: DC | PRN

## 2012-07-09 MED ORDER — ALTEPLASE 2 MG IJ SOLR
2.0000 mg | Freq: Once | INTRAMUSCULAR | Status: DC | PRN
  Filled 2012-07-09: qty 2

## 2012-07-09 MED ORDER — HEPARIN SODIUM (PORCINE) 1000 UNIT/ML DIALYSIS: 1000.0000 [IU] | INTRAMUSCULAR | Status: DC | PRN

## 2012-07-09 MED ORDER — PENTAFLUOROPROP-TETRAFLUOROETH EX AERO
INHALATION_SPRAY | CUTANEOUS | Status: AC
  Filled 2012-07-09: qty 103.5

## 2012-07-09 MED ORDER — HEPARIN SODIUM (PORCINE) 1000 UNIT/ML DIALYSIS: 20.0000 [IU]/kg | INTRAMUSCULAR | Status: DC | PRN

## 2012-07-09 MED ORDER — SODIUM CHLORIDE 0.9 % IV SOLN
125.0000 mg | Freq: Once | INTRAVENOUS | Status: AC
  Administered 2012-07-09: 125 mg via INTRAVENOUS
  Filled 2012-07-09: qty 10

## 2012-07-09 MED ORDER — PENTAFLUOROPROP-TETRAFLUOROETH EX AERO: 1.0000 "application " | INHALATION_SPRAY | CUTANEOUS | Status: DC | PRN

## 2012-07-09 MED ORDER — ACETAMINOPHEN 325 MG PO TABS
ORAL_TABLET | ORAL | Status: AC
  Administered 2012-07-09: 650 mg via ORAL
  Filled 2012-07-09: qty 2

## 2012-07-09 MED ORDER — LIDOCAINE HCL (PF) 1 % IJ SOLN: 5.0000 mL | INTRAMUSCULAR | Status: DC | PRN

## 2012-07-09 MED ORDER — NEPRO/CARBSTEADY PO LIQD
237.0000 mL | ORAL | Status: DC | PRN
  Filled 2012-07-09: qty 237

## 2012-07-09 MED ORDER — LIDOCAINE-PRILOCAINE 2.5-2.5 % EX CREA
1.0000 "application " | TOPICAL_CREAM | CUTANEOUS | Status: DC | PRN
  Filled 2012-07-09: qty 5

## 2012-07-09 NOTE — Procedures (Addendum)
[facility]  On HD via L upper arm AVF BP--178/83 Goal--4L Hgb--pending   K+--pending

## 2012-07-09 NOTE — Evaluation (Signed)
Physical Therapy Evaluation Patient Details Name: KISCHA ERLE MRN: [id number] DOB: 20-Oct-1940 Today's Date: 07/09/2012 Time: [id number] PT Time Calculation (min): 23 min  PT Assessment / Plan / Recommendation Clinical Impression  pt admitted with chest pain due to coughing from pleural effusions post recent CABG.  Cough has mostly resolved with significant soreness remaining.  Pt can benefit from PT to continue improving toward indepedence.  Pt still planning to return to rehab before home, though will not need a significant amount of time.    PT Assessment  Patient needs continued PT services    Follow Up Recommendations  SNF;Other (comment) (expect quicker return home)    Does the patient have the potential to tolerate intense rehabilitation      Barriers to Discharge None      Equipment Recommendations  Rolling walker with 5" wheels    Recommendations for Other Services     Frequency Min 3X/week    Precautions / Restrictions Precautions Precautions: Sternal;Fall Restrictions Weight Bearing Restrictions: No   Pertinent Vitals/Pain       Mobility  Bed Mobility Bed Mobility: Supine to Sit Supine to Sit: 5: Supervision;HOB elevated Transfers Transfers: Sit to Stand;Stand to Sit Sit to Stand: 5: Supervision;From bed Stand to Sit: 5: Supervision;With upper extremity assist;To chair/3-in-1 Details for Transfer Assistance: reinforced sternal precautions Ambulation/Gait Ambulation/Gait Assistance: 4: Min guard;5: Supervision Ambulation Distance (Feet): 300 Feet Assistive device: None Ambulation/Gait Assistance Details: initially started off with unsteady gait, but finished with steady gait and cadence  subjectively at a neighborhood level. Gait Pattern: Step-through pattern;Within Functional Limits Stairs: No    Shoulder Instructions     Exercises     PT Diagnosis: Acute pain;Generalized weakness;Difficulty walking  PT Problem List: Decreased strength;Decreased  activity tolerance;Decreased balance;Decreased knowledge of precautions PT Treatment Interventions: Gait training;Functional mobility training;Therapeutic activities;Balance training;Patient/family education   PT Goals Acute Rehab PT Goals Time For Goal Achievement: 07/16/12 Potential to Achieve Goals: Good Pt will go Sit to Stand: with modified independence PT Goal: Sit to Stand - Progress: Goal set today Pt will Ambulate: >150 feet;with modified independence;with least restrictive assistive device PT Goal: Ambulate - Progress: Goal set today  Visit Information  Last PT Received On: 07/09/12 Assistance Needed: +1    Subjective Data  Subjective: I feel so sore just since I stopped coughing Patient Stated Goal: To go home   Prior [place] Lives With: Spouse Available Help at Discharge: Family;Available 24 hours/day Type of Home: House Home Access: Stairs to enter [employer] of Steps: 1 Entrance Stairs-Rails: None Home Layout: One level Bathroom Shower/Tub: Chiropodist: Standard Home Adaptive Equipment: None Additional Comments: 2 daughters assist patient as well as husband Prior Function Level of Independence: Independent Able to Take Stairs?: Yes Driving: Yes Vocation: Retired Corporate investment banker: No difficulties    Cognition  Overall Cognitive Status: Appears within functional limits for tasks assessed/performed Arousal/Alertness: Awake/alert Orientation Level: Appears intact for tasks assessed Behavior During Session: [facility] for tasks performed    Extremity/Trunk Assessment Right Lower Extremity Assessment RLE ROM/Strength/Tone: [facility] for tasks assessed Left Lower Extremity Assessment LLE ROM/Strength/Tone: [facility] for tasks assessed Trunk Assessment Trunk Assessment: Normal   Balance Balance Balance Assessed: Yes Static Sitting Balance Static Sitting - Balance Support: Feet supported;No upper extremity  supported Static Sitting - Level of Assistance: 7: Independent Dynamic Standing Balance Dynamic Standing - Balance Support: During functional activity;Bilateral upper extremity supported Dynamic Standing - Level of Assistance: 5: Stand by assistance  End  of Session PT - End of Session Activity Tolerance: Patient tolerated treatment well Patient left: in chair;with call bell/phone within reach;with family/visitor present Nurse Communication: Mobility status  GP Functional Assessment Tool Used: clinical judgement Functional Limitation: Mobility: Walking and moving around Mobility: Walking and Moving Around Current Status [id number]): At least 1 percent but less than 20 percent impaired, limited or restricted Mobility: Walking and Moving Around Goal Status [phone]): At least 1 percent but less than 20 percent impaired, limited or restricted Mobility: Walking and Moving Around Discharge Status [phone]): At least 1 percent but less than 20 percent impaired, limited or restricted   Catcher Dehoyos, Tessie Fass 07/09/2012, 12:57 PM  07/09/2012  Donnella Sham, PT [phone] (pager)

## 2012-07-09 NOTE — Progress Notes (Signed)
CARDIAC REHAB PHASE I   PRE:  Rate/Rhythm: 92SR  BP:  Supine:   Sitting: 140/56  Standing:    SaO2: 97%RA  MODE:  Ambulation: 900 ft   POST:  Rate/Rhythem: 99  BP:  Supine:   Sitting: 156/70  Standing:    SaO2: 95%RA 1430-1505 Pt walked 900 ft on RA with rolling walker and asst x 2. Did well. Tolerated increase in distance well. Can be asst x 1. To sitting on side of bed after walk.  Jeani Sow

## 2012-07-09 NOTE — Progress Notes (Signed)
[facility]  Subjective:  Awake, alert,husband at bedside    Objective: Vital signs in last 24 hours: Blood pressure 135/62, pulse 90, temperature 98.9 F (37.2 C), temperature source Oral, resp. rate 19, height 5\' 1"  (1.549 m), weight 61.5 kg (135 lb 9.3 oz), SpO2 100.00%.    PHYSICAL EXAM General--a sabove Chest--crackles R base Heart--no rub Abd--nontender Extr--no edema, AVF L upper arm  Lab Results:   Lab 07/07/12 0815 07/05/12 1535 07/05/12 0500 07/03/12 1504  NA 135 137 140 --  K 3.8 4.0 4.1 --  CL 93* 96 98 --  CO2 26 26 28  --  BUN 42* 53* 47* --  CREATININE 7.86* 8.52* 7.82* --  ALB -- -- -- --  GLUCOSE 172* -- -- --  CALCIUM 9.4 9.3 9.8 --  PHOS 5.0* 4.6 -- 5.0*     Basename 07/08/12 1112 07/07/12 0815  WBC 9.5 10.5  HGB 9.4* 10.4*  HCT 30.6* 32.0*  PLT 323 395   I have reviewed the patient's current medications.    Assessment/Plan: 1. Cough and chest wall pain- coughing up phlegm.   Feels better.No antibiotics as of now 2. ESRD-  Normally MWF.  Had HD on Sat.  [facility] says they're open for regular HD day on Wed 1 Jan.  will dialyze today 3. Anemia:cont with esa 4. CAD s/p CABG- non cardiac CP 5. Nutrition:cont to encourage po intake 6. Hypertension-- on losartan 100/d,amlodipine 10/d, clonidine TTS 2/wk.  BP 135/62 today 7. dispo- hopeful discharge tomorrow     LOS: 6 days   Renezmae Canlas F 07/09/2012,1:05 PM   .labalb

## 2012-07-09 NOTE — Progress Notes (Signed)
Progress Note  Name:  Shawna [name]  Date:  07/09/2012 7:47 AM   Subjective:  Complains of chest pain with cough and movements; no dyspnea; cough persists  Objective:   Vital signs in last 24 hours: Temp:  [98.8 F (37.1 C)-101.2 F (38.4 C)] 98.9 F (37.2 C) (12/30 0500) Pulse Rate:  [90-104] 90  (12/30 0500) Resp:  [18-19] 19  (12/30 0500) BP: (110-150)/(58-67) 136/58 mmHg (12/30 0500) SpO2:  [97 %-100 %] 100 % (12/30 0500) Weight:  [135 lb 9.3 oz (61.5 kg)] 135 lb 9.3 oz (61.5 kg) (12/30 0648)  135 lb 9.3 oz (61.5 kg) Body mass index is 25.62 kg/(m^2).  Weight change: 2 lb 6.8 oz (1.1 kg) Last BM Date: 07/07/12  Intake/Output from previous day: 12/29 0701 - 12/30 0700 In: 360 [P.O.:360] Out: -  Total I&O since admission: -4.5 L  Tele:  Sinus rhythm; sinus tach  PHYSICAL EXAM General: Mildly uncomfortable, especially with when she moves Neck:  No JVD Lungs: Mild basilar crackles; sternotomy without evidence of infection. Heart: normal S1, S2;  Regular rate and rhythm Abdomen: soft, nontender Extremities: no edema Psych:  Normal affect  Lab Results: CBC:    Basename 07/08/12 1112 07/07/12 0815  WBC 9.5 10.5  HGB 9.4* 10.4*  HCT 30.6* 32.0*  PLT 323 395   BMET:   Basename 07/07/12 0815  NA 135  K 3.8  CL 93*  CO2 26  GLUCOSE 172*  BUN 42*  CREATININE 7.86*  CALCIUM 9.4   Hepatic Function:    Basename 07/07/12 0815  PROT --  ALBUMIN 2.8*  AST --  ALT --  ALKPHOS --  BILITOT --  BILIDIR --  IBILI --   GFR:  Estimated Creatinine Clearance: 5.4 ml/min (by C-G formula based on Cr of 7.86). Lipids:    Basename 07/07/12 0815  CHOL 129  TRIG 97  HDL 51   Radiology/Studies:  Dg Chest 2 View  07/04/2012  IMPRESSION: Improved aeration with only tiny effusions and mild left basilar linear atelectasis.   Original Report Authenticated By: Ivar Drape, M.D.    Dg Chest 2 View  06/09/2012   IMPRESSION:  1.  Right lower lobe pneumonia. 2.  Mild  cardiomegaly without failure.   Original Report Authenticated By: San Morelle, M.D.    Nm Pulmonary Perf And Vent   07/04/2012   IMPRESSION: Negative exam.   Original Report Authenticated By: Orlean Patten, M.D.      Assessment and Plan:  1. Chest Pain:  Appears to be muskuloskeletal. Continue Tramadol prn for pain; continue colchicine. VQ negative. 2. Cough:   Also with fever; Await Blood cultures and UA. 3. CAD s/p CABG:   Continue ASA, Brilinta (previous PCI), atorvastatin. 4. Hypertension:  Reasonable control. 5. ESRD:  Per nephrology. 6. Carotid stenosis:   Outpatient follow up. DC when afebrile (if cultures negative) and cough improves. Kirk Ruths, MD 07/09/2012, 7:47 AM

## 2012-07-10 DIAGNOSIS — I5033 Acute on chronic diastolic (congestive) heart failure: Secondary | ICD-10-CM

## 2012-07-10 DIAGNOSIS — I509 Heart failure, unspecified: Secondary | ICD-10-CM

## 2012-07-10 MED ORDER — VANCOMYCIN HCL IN DEXTROSE 1-5 GM/200ML-% IV SOLN
1000.0000 mg | Freq: Once | INTRAVENOUS | Status: AC
  Administered 2012-07-10: 1000 mg via INTRAVENOUS
  Filled 2012-07-10: qty 200

## 2012-07-10 MED ORDER — DOXERCALCIFEROL 4 MCG/2ML IV SOLN
INTRAVENOUS | Status: AC
  Administered 2012-07-10: 3.5 ug
  Filled 2012-07-10: qty 2

## 2012-07-10 MED ORDER — DARBEPOETIN ALFA-POLYSORBATE 100 MCG/0.5ML IJ SOLN
100.0000 ug | INTRAMUSCULAR | Status: DC
  Filled 2012-07-10: qty 0.5

## 2012-07-10 MED ORDER — DOXERCALCIFEROL 4 MCG/2ML IV SOLN: 3.5000 ug | Freq: Once | INTRAVENOUS | Status: DC

## 2012-07-10 MED ORDER — DOXERCALCIFEROL 4 MCG/2ML IV SOLN: 3.5000 ug | INTRAVENOUS | Status: DC

## 2012-07-10 MED ORDER — MAGNESIUM HYDROXIDE 400 MG/5ML PO SUSP
30.0000 mL | Freq: Every day | ORAL | Status: DC | PRN
  Filled 2012-07-10: qty 30

## 2012-07-10 MED ORDER — SODIUM CHLORIDE 0.9 % IV SOLN
125.0000 mg | INTRAVENOUS | Status: DC
  Administered 2012-07-11: 125 mg via INTRAVENOUS
  Filled 2012-07-10: qty 10

## 2012-07-10 MED ORDER — VANCOMYCIN HCL 500 MG IV SOLR
500.0000 mg | INTRAVENOUS | Status: DC
  Filled 2012-07-10 (×2): qty 500

## 2012-07-10 NOTE — Progress Notes (Signed)
[facility]  Subjective:  Awake,alert, says R chest still sore Dialyzed last night to 58.9 kg [facility] dialysis says  edw 61.5 kg,       EPO 10,000, hectorol 3.5 mcg each HD   Objective: Vital signs in last 24 hours: Blood pressure 152/64, pulse 96, temperature 98.9 F (37.2 C), temperature source Oral, resp. rate 18, height 5\' 1"  (1.549 m), weight 59.7 kg (131 lb 9.8 oz), SpO2 95.00%.    PHYSICAL EXAM General--as above  Chest--sternotomy clean, clear Heart--no rub Abd--nontender Extr--no edema, AVF patent L upper arm  Lab Results:   Lab 07/09/12 1400 07/07/12 0815 07/05/12 1535  NA 132* 135 137  K 5.5* 3.8 4.0  CL 89* 93* 96  CO2 27 26 26   BUN 49* 42* 53*  CREATININE 9.08* 7.86* 8.52*  ALB -- -- --  GLUCOSE 102* -- --  CALCIUM 9.5 9.4 9.3  PHOS 4.6 5.0* 4.6     Basename 07/09/12 1400 07/08/12 1112  WBC 11.5* 9.5  HGB 10.2* 9.4*  HCT 31.2* 30.6*  PLT 380 323   I have reviewed the patient's current medications.  Assessment/Plan: 1. Cough and chest wall pain- coughing up phlegm. Feels sore.  Vanco 1g IV given yesterday.  All cultures neg so far 2. ESRD- Normally MWF. Had HD on Sat. [facility] says they're open for regular HD day on Wed 1 Jan. Dialyzed yesterday to 58.7 kg.  I called [facility] and told them to decrease EDW to 60 kg 3. Anemia: cont with aranesp 100/wk 4. CAD s/p CABG- per cardiol and TCTS 5. Nutrition:cont to encourage po intake 6. Hypertension-- on losartan 100/d,amlodipine 10/d, clonidine TTS 2/wk. BP 152/64 today 7. Secondary PTH--usually on hectorol 3.5 mcg each HD.  Will give today, then with each HD 8. ? Home soon?   LOS: 7 days   Chelsye Suhre F 07/10/2012,10:35 AM   .labalb

## 2012-07-10 NOTE — Progress Notes (Signed)
Received iron ivp from hemodialysis nurse which wasn't infused during dialysis , to be completed on unit. Pt refused med (iron Ivp). Pt states, she wants to speak with her MD in the AM.

## 2012-07-10 NOTE — Clinical Social Work Note (Signed)
Clinical Social Worker spoke with Mardene Celeste at [facility] and patient has a limited pasarr number that expires on 07/20/11. Facility is unsure if they will be able to obtain another pasarr number and patient would have to discharge from their facility. CSW reviewed chart and noticed PT recommendation of home health.   Leandro Reasoner MSW, [place] ([place]) [phone]

## 2012-07-10 NOTE — Progress Notes (Signed)
CRITICAL VALUE ALERT  Critical value received:  Blood cultures gram + cocci in clusters  Date of notification:  07/10/2012   Time of notification:  0005  Critical value read back:yes  Nurse who received alert:  Alvan Culpepper, Luciano Cutter , RN  MD notified (1st page):  [place]  Time of first page:  0015  MD notified (2nd page):  Time of second page:  Responding MD:  Colon Flattery  Time MD responded:  [phone]

## 2012-07-10 NOTE — Progress Notes (Signed)
ANTIBIOTIC CONSULT NOTE - INITIAL  Pharmacy Consult for Vancomycin  Indication: positive blood cultures  Allergies  Allergen Reactions  . Aspirin Other (See Comments)    Mess up her stomach; "makes my bowels have blood in them". Takes 81 mg EC Aspirin   . Contrast Media (Iodinated Diagnostic Agents) Itching  . Iron Itching and Other (See Comments)    "they gave me iron in dialysis; had to give me Benadryl cause I had to have the iron" (05/02/2012)  . Macrodantin (Nitrofurantoin Macrocrystal) Other (See Comments)    "broke me out in big old knots all over my body; had to go to ER"  . Penicillins Other (See Comments)    "makes me real weak when I take it; like I'll pass out"  . Plavix (Clopidogrel Bisulfate) Rash  . Bactrim (Sulfamethoxazole W-Trimethoprim) Rash  . Sulfa Antibiotics Rash  . Venofer (Ferric Oxide) Itching    Patient reports using Benadryl prior to doses as [place]    Patient Measurements: Height: 5\' 1"  (154.9 cm) Weight: 129 lb 13.6 oz (58.9 kg) IBW/kg (Calculated) : 47.8   Vital Signs: Temp: 100 F (37.8 C) (12/30 2056) Temp src: Oral (12/30 2056) BP: 138/64 mmHg (12/30 2300) Pulse Rate: 110  (12/30 2210) Intake/Output from previous day: 12/30 0701 - 12/31 0700 In: 840 [P.O.:840] Out: 2291  Intake/Output from this shift: Total I/O In: -  Out: 2291 [Other:2291]  Labs:  [facility] 07/09/12 1400 07/08/12 1112 07/07/12 0815  WBC 11.5* 9.5 10.5  HGB 10.2* 9.4* 10.4*  PLT 380 323 395  LABCREA -- -- --  CREATININE 9.08* -- 7.86*   Estimated Creatinine Clearance: 4.6 ml/min (by C-G formula based on Cr of 9.08). No results found for this basename: VANCOTROUGH:2,VANCOPEAK:2,VANCORANDOM:2,GENTTROUGH:2,GENTPEAK:2,GENTRANDOM:2,TOBRATROUGH:2,TOBRAPEAK:2,TOBRARND:2,AMIKACINPEAK:2,AMIKACINTROU:2,AMIKACIN:2, in the last 72 hours   Microbiology: Recent Results (from the past 720 hour(s))  SURGICAL PCR SCREEN     Status: Normal   Collection Time   06/12/12   6:46 PM      Component Value Range Status Comment   MRSA, PCR NEGATIVE  NEGATIVE Final    Staphylococcus aureus NEGATIVE  NEGATIVE Final   CULTURE, BLOOD (ROUTINE X 2)     Status: Normal (Preliminary result)   Collection Time   07/08/12 11:00 AM      Component Value Range Status Comment   Specimen Description BLOOD RIGHT HAND   Final    Special Requests BOTTLES DRAWN AEROBIC ONLY [facility]   Final    Culture  Setup Time 07/08/2012 18:45   Final    Culture     Final    Value: GRAM POSITIVE COCCI IN CLUSTERS     Note: Gram Stain Report Called to,Read Back By and Verified With: DANIELLE BOWEN ON 07/09/2012 AT 11:34P BY WILEJ   Report Status PENDING   Incomplete   CULTURE, BLOOD (ROUTINE X 2)     Status: Normal (Preliminary result)   Collection Time   07/08/12 11:17 AM      Component Value Range Status Comment   Specimen Description BLOOD RIGHT ARM   Final    Special Requests BOTTLES DRAWN AEROBIC ONLY 3CC   Final    Culture  Setup Time 07/08/2012 18:45   Final    Culture     Final    Value:        BLOOD CULTURE RECEIVED NO GROWTH TO DATE CULTURE WILL BE HELD FOR 5 DAYS BEFORE ISSUING A FINAL NEGATIVE REPORT   Report Status PENDING   Incomplete  Medical History: Past Medical History  Diagnosis Date  . Essential hypertension, benign   . Chronic bronchitis   . GERD (gastroesophageal reflux disease)   . PUD (peptic ulcer disease)   . History of lower GI bleeding   . Arthritis   . History of gout   . Coronary atherosclerosis of native coronary artery     a. 12/2011 NSTEMI/Cath/PCI LCX (2.25x14 Resolute DES) & D1 (2.25x22 Resolute DES);  b. 01/2012 Cath/PCI: LM 30, LAD 30p, 40-66m, D1 stent ok, 99 in sm branch of diag, LCX patent stent, OM1 20, RCA 95 ost (4.0x12 Promus DES), EF 55%;  c. 04/2012 Lexi Cardiolite  EF 48%, small area of scar @ base/mid inflat wall with mild peri-infarct ischemia.  . High cholesterol 12/2011  . Chronic diastolic CHF (congestive heart failure)     a. 02/2012 Echo  EF 60-65%, nl wall motion, Gr 1 DD, mod MR  . Pneumonia ~ 2009  . Iron deficiency anemia   . TIA (transient ischemic attack)   . Anxiety   . History of blood transfusion 07/2011; 12/2011; 01/2012 X 2; 04/2012  . ESRD (end stage renal disease) on dialysis 12/15/11    a. HD @ [facility]; Mon, Wed, Fri  . Carotid artery disease     a. [id number] LICA, [id number]   . Mitral regurgitation     a. Moderate by echo, 02/2012  . Myocardial infarction   . Ovarian cancer 1992  . Colon cancer 1992    Medications:  Scheduled:    . amLODipine  10 mg Oral QHS  . aspirin  81 mg Oral Daily  . atorvastatin  10 mg Oral QPC supper  . benzonatate  200 mg Oral TID  . chlorpheniramine-HYDROcodone  5 mL Oral TID  . cloNIDine  0.2 mg Transdermal Weekly  . colchicine  0.6 mg Oral Daily  . darbepoetin (ARANESP) injection - DIALYSIS  100 mcg Intravenous Q Tue-HD  . ferric gluconate (FERRLECIT/NULECIT) IV  125 mg Intravenous Q T,Th,Sa-HD  . [COMPLETED] ferric gluconate (FERRLECIT/NULECIT) IV  125 mg Intravenous Once  . fluticasone  2 spray Each Nare Daily  . lanthanum  1,000 mg Oral TID PC  . losartan  100 mg Oral Daily  . multivitamin  1 tablet Oral Daily  . pantoprazole  40 mg Oral BID AC  . pentafluoroprop-tetrafluoroeth      . Ticagrelor  90 mg Oral BID   Assessment: 71 yo female admitted with cough and chest pain, found to have 1/2 blood cultures growing GPC, for empiric vancomycin   Goal of Therapy:  Vancomycin pre-HD level 15-25  Plan:  Vancomycin 1 g IV now, then 500 mg IV after each HD  Codee Tutson, [place] Curb 07/10/2012,12:41 AM

## 2012-07-10 NOTE — Progress Notes (Signed)
CARDIAC REHAB PHASE I   PRE:  Rate/Rhythm: 96SR  BP:  Supine: 150/60  Sitting:   Standing:    SaO2: 96%RA  MODE:  Ambulation: 500 ft   POST:  Rate/Rhythem: 101  BP:  Supine:   Sitting: 160/80  Standing:    SaO2: 97%RA 1106-1140 Pt c/o pain in back, neck and chest. Did not want to walk but agreed hoping it would help her feel better. Walked 500 ft on RA with rolling walker and asst x 1. Tolerated well once up. Did need much assistance to get out of bed due to soreness. To sitting on side of bed after walk for lunch.  Shawna [name]

## 2012-07-10 NOTE — Progress Notes (Signed)
Physical Therapy Treatment Patient Details Name: Shawna [name] MRN: [id number] DOB: 12/11/1940 Today's Date: 07/10/2012 Time: [id number] PT Time Calculation (min): 14 min  PT Assessment / Plan / Recommendation Comments on Treatment Session  pt rpesents with Pleural Effusions and recent CABG.  pt moving well today, though still somewhat fearful of falling.  Anticipate pt will contineu to make good progress.      Follow Up Recommendations  Home health PT;Supervision/Assistance - 24 hour     Does the patient have the potential to tolerate intense rehabilitation     Barriers to Discharge        Equipment Recommendations  None recommended by PT    Recommendations for Other Services    Frequency Min 3X/week   Plan Discharge plan needs to be updated;Frequency remains appropriate    Precautions / Restrictions Precautions Precautions: Sternal;Fall Restrictions Weight Bearing Restrictions: No   Pertinent Vitals/Pain Indicates minimal pain.      Mobility  Bed Mobility Bed Mobility: Not assessed Transfers Transfers: Sit to Stand;Stand to Sit Sit to Stand: 5: Supervision;From bed Stand to Sit: 5: Supervision;To bed Details for Transfer Assistance: cues for sternal precuations.   Ambulation/Gait Ambulation/Gait Assistance: 4: Min guard Ambulation Distance (Feet): 500 Feet Assistive device: None Ambulation/Gait Assistance Details: pt notes not feeling confident yet, but demos safe gait.  Discussed ambulating again later today with someone with her.   Gait Pattern: Step-to pattern;Decreased stride length Stairs: No Wheelchair Mobility Wheelchair Mobility: No    Exercises     PT Diagnosis:    PT Problem List:   PT Treatment Interventions:     PT Goals Acute Rehab PT Goals Time For Goal Achievement: 07/16/12 Potential to Achieve Goals: Good PT Goal: Sit to Stand - Progress: Progressing toward goal PT Goal: Ambulate - Progress: Progressing toward goal  Visit Information  Last PT Received On: 07/10/12 Assistance Needed: +1    Subjective Data  Subjective: I'm sore, but I guess I'm doing better.     Cognition  Overall Cognitive Status: Appears within functional limits for tasks assessed/performed Arousal/Alertness: Awake/alert Orientation Level: Appears intact for tasks assessed Behavior During Session: [facility] for tasks performed    Balance  Balance Balance Assessed: No  End of Session PT - End of Session Equipment Utilized During Treatment: Gait belt Activity Tolerance: Patient tolerated treatment well Patient left: in bed;with call bell/phone within reach;with family/visitor present (Sitting EOB) Nurse Communication: Mobility status   GP     [name], [place] 07/10/2012, 2:00 PM

## 2012-07-10 NOTE — Progress Notes (Signed)
SUBJECTIVE:  She still complains of chest pain on the right side but her cough is improved.. Of note she was found to have one positive blood culture last night.     PHYSICAL EXAM Filed Vitals:   07/09/12 2210 07/09/12 2300 07/10/12 0035 07/10/12 0635  BP: 167/74 138/64 131/81 152/67  Pulse: 110   99  Temp:    98.9 F (37.2 C)  TempSrc:    Oral  Resp: 16   18  Height:      Weight:    131 lb 9.8 oz (59.7 kg)  SpO2:    95%   General:  No acute distress Lungs:  Clear Heart:  RRR Abdomen:  Positive bowel sounds, no rebound no guarding Extremities:  No edema.   LABS: Lab Results  Component Value Date   CKTOTAL 224* 01/12/2012   CKMB 5.2* 01/12/2012   TROPONINI 0.47* 05/02/2012   Results for orders placed during the hospital encounter of 07/03/12 (from the past 24 hour(s))  CBC     Status: Abnormal   Collection Time   07/09/12  2:00 PM      Component Value Range   WBC 11.5 (*) 4.0 - 10.5 K/uL   RBC 3.44 (*) 3.87 - 5.11 MIL/uL   Hemoglobin 10.2 (*) 12.0 - 15.0 g/dL   HCT 31.2 (*) 36.0 - 46.0 %   MCV 90.7  78.0 - 100.0 fL   MCH 29.7  26.0 - 34.0 pg   MCHC 32.7  30.0 - 36.0 g/dL   RDW 19.9 (*) 11.5 - 15.5 %   Platelets 380  150 - 400 K/uL  RENAL FUNCTION PANEL     Status: Abnormal   Collection Time   07/09/12  2:00 PM      Component Value Range   Sodium 132 (*) 135 - 145 mEq/L   Potassium 5.5 (*) 3.5 - 5.1 mEq/L   Chloride 89 (*) 96 - 112 mEq/L   CO2 27  19 - 32 mEq/L   Glucose, Bld 102 (*) 70 - 99 mg/dL   BUN 49 (*) 6 - 23 mg/dL   Creatinine, Ser 9.08 (*) 0.50 - 1.10 mg/dL   Calcium 9.5  8.4 - 10.5 mg/dL   Phosphorus 4.6  2.3 - 4.6 mg/dL   Albumin 2.9 (*) 3.5 - 5.2 g/dL   GFR calc non Af Amer 4 (*) >90 mL/min   GFR calc Af Amer 4 (*) >90 mL/min  URINALYSIS, ROUTINE W REFLEX MICROSCOPIC     Status: Abnormal   Collection Time   07/09/12  5:21 PM      Component Value Range   Color, Urine YELLOW  YELLOW   APPearance CLEAR  CLEAR   Specific Gravity, Urine 1.014   1.005 - 1.030   pH 8.0  5.0 - 8.0   Glucose, UA NEGATIVE  NEGATIVE mg/dL   Hgb urine dipstick SMALL (*) NEGATIVE   Bilirubin Urine NEGATIVE  NEGATIVE   Ketones, ur NEGATIVE  NEGATIVE mg/dL   Protein, ur 100 (*) NEGATIVE mg/dL   Urobilinogen, UA 0.2  0.0 - 1.0 mg/dL   Nitrite NEGATIVE  NEGATIVE   Leukocytes, UA TRACE (*) NEGATIVE  URINE MICROSCOPIC-ADD ON     Status: Abnormal   Collection Time   07/09/12  5:21 PM      Component Value Range   Squamous Epithelial / LPF FEW (*) RARE   WBC, UA 7-10  <3 WBC/hpf   RBC / HPF 0-2  <3 RBC/hpf  Bacteria, UA RARE  RARE    Intake/Output Summary (Last 24 hours) at 07/10/12 0742 Last data filed at 07/09/12 2056  Gross per 24 hour  Intake    480 ml  Output   2291 ml  Net  -1811 ml     ASSESSMENT AND PLAN:  Cough:  Now with elevated WBC.  T max 100.  Culture x 1 was positive with cocci in clusters.  Drawn from the right hand.  The second bottle is negative thus far.  Awaiting final ID.  She received one dose of Vanc early this am.  I will await further labs and ID before considering further antibiotics.   Chest wall pain:  Treating with meds as listed and control of cough.  Cough is much improved although she still has pain.  Continue with pain management.   Essential hypertension, benign:  BP controlled.   ESRD (end stage renal disease):  Dialsysis 12/30.  Scheduled for MWF as an outpatient.  Potasium is up.  Na is down.  Dialysis in patient again in the am.   Hx of CABG :  Patient is now stable after recent bypass surgery.      Jeneen Rinks [facility] 07/10/2012 7:42 AM

## 2012-07-11 DIAGNOSIS — R6521 Severe sepsis with septic shock: Secondary | ICD-10-CM

## 2012-07-11 DIAGNOSIS — A419 Sepsis, unspecified organism: Secondary | ICD-10-CM

## 2012-07-11 LAB — RENAL FUNCTION PANEL
CO2: 26 mEq/L (ref 19–32)
Calcium: 9.9 mg/dL (ref 8.4–10.5)
Creatinine, Ser: 7.18 mg/dL — ABNORMAL HIGH (ref 0.50–1.10)
GFR calc Af Amer: 6 mL/min — ABNORMAL LOW (ref >90)
GFR calc non Af Amer: 5 mL/min — ABNORMAL LOW (ref >90)
Phosphorus: 3.9 mg/dL (ref 2.3–4.6)
Sodium: 132 mEq/L — ABNORMAL LOW (ref 135–145)

## 2012-07-11 LAB — CULTURE, BLOOD (ROUTINE X 2)

## 2012-07-11 LAB — CBC
MCH: 28.9 pg (ref 26.0–34.0)
MCHC: 32 g/dL (ref 30.0–36.0)
MCV: 90.3 fL (ref 78.0–100.0)
Platelets: 360 10*3/uL (ref 150–400)
RBC: 3.7 MIL/uL — ABNORMAL LOW (ref 3.87–5.11)
RDW: 19.2 % — ABNORMAL HIGH (ref 11.5–15.5)

## 2012-07-11 MED ORDER — DARBEPOETIN ALFA-POLYSORBATE 100 MCG/0.5ML IJ SOLN
INTRAMUSCULAR | Status: AC
  Administered 2012-07-11: 100 ug
  Filled 2012-07-11: qty 0.5

## 2012-07-11 NOTE — Progress Notes (Signed)
Pt has no peripheral IV access.  Pt had IV since 07/03/12 and refused to be "stuck" again.  Pt said she was supposed be discharged today after her HD.  According to notes in epic, pt should stay overnight.  Will contact Dr. Stanford Breed.  Pt resting and awaiting HD today. Will continue to monitor. Payton Emerald

## 2012-07-11 NOTE — Progress Notes (Signed)
Progress Note  Name:  Shawna [name]  Date:  07/11/2012 7:54 AM   Subjective:  Chest pain and cough improving; no dyspnea  Objective:   Vital signs in last 24 hours: Temp:  [98.3 F (36.8 C)-98.5 F (36.9 C)] 98.3 F (36.8 C) (01/01 0527) Pulse Rate:  [91-98] 91  (01/01 0527) Resp:  [18] 18  (12/31 2102) BP: (150-170)/(60-68) 161/68 mmHg (01/01 0527) SpO2:  [95 %-96 %] 96 % (01/01 0527) Weight:  [133 lb 9.6 oz (60.6 kg)] 133 lb 9.6 oz (60.6 kg) (01/01 0527)  133 lb 9.6 oz (60.6 kg) Body mass index is 25.24 kg/(m^2).  Weight change: -1 lb 15.8 oz (-0.9 kg) Last BM Date: 07/09/12  Intake/Output from previous day: 12/31 0701 - 01/01 0700 In: 75 [P.O.:530] Out: 0  Total I&O since admission: -4.5 L  Tele:  Sinus rhythm; sinus tach  PHYSICAL EXAM General: WD/WN NAD Neck:  No JVD Lungs: CTA; sternotomy without evidence of infection. Heart: normal S1, S2;  Regular rate and rhythm Abdomen: soft, nontender Extremities: no edema Psych:  Normal affect  Lab Results: CBC:    Basename 07/09/12 1400 07/08/12 1112  WBC 11.5* 9.5  HGB 10.2* 9.4*  HCT 31.2* 30.6*  PLT 380 323   BMET:   Basename 07/09/12 1400  NA 132*  K 5.5*  CL 89*  CO2 27  GLUCOSE 102*  BUN 49*  CREATININE 9.08*  CALCIUM 9.5   Hepatic Function:    Basename 07/09/12 1400  PROT --  ALBUMIN 2.9*  AST --  ALT --  ALKPHOS --  BILITOT --  BILIDIR --  IBILI --   GFR:  Estimated Creatinine Clearance: 4.7 ml/min (by C-G formula based on Cr of 9.08). Radiology/Studies:  Dg Chest 2 View  07/04/2012  IMPRESSION: Improved aeration with only tiny effusions and mild left basilar linear atelectasis.   Original Report Authenticated By: Ivar Drape, M.D.    Dg Chest 2 View  06/09/2012   IMPRESSION:  1.  Right lower lobe pneumonia. 2.  Mild cardiomegaly without failure.   Original Report Authenticated By: San Morelle, M.D.    Nm Pulmonary Perf And Vent   07/04/2012   IMPRESSION: Negative exam.    Original Report Authenticated By: Orlean Patten, M.D.      Assessment and Plan:  1. Chest Pain:  Appears to be muskuloskeletal. Continue Tramadol prn for pain; continue colchicine. VQ negative. 2. Bacteremia:   1 of 2 cultures with gm+ cocci in clusters; ?contaminant; fever resolved; patient being treated with vancomycin; will review with nephrology duration of therapy. 3. CAD s/p CABG:   Continue ASA, Brilinta (previous PCI), atorvastatin. 4. Hypertension:  Reasonable control. 5. ESRD:  Per nephrology. 6. Carotid stenosis:   Outpatient follow up. DC in AM if afebrile and chest pain better. Kirk Ruths, MD 07/11/2012, 7:54 AM

## 2012-07-11 NOTE — Progress Notes (Signed)
[facility]  Subjective:  Awake,alert, feels better 1/2 blood cult + for gr + cocci--also had T 101 on  28 and 29 Dec   Objective: Vital signs in last 24 hours: Blood pressure 161/68, pulse 91, temperature 98.3 F (36.8 C), temperature source Oral, resp. rate 18, height 5\' 1"  (1.549 m), weight 60.6 kg (133 lb 9.6 oz), SpO2 96.00%.    PHYSICAL EXAM General--as above  Chest--sternotomy clean, clear  Heart--no rub  Abd--nontender  Extr--no edema, AVF patent L upper arm  Lab Results:   Lab 07/11/12 0823 07/09/12 1400 07/07/12 0815  NA 132* 132* 135  K 5.0 5.5* 3.8  CL 90* 89* 93*  CO2 26 27 26   BUN 41* 49* 42*  CREATININE 7.18* 9.08* 7.86*  ALB -- -- --  GLUCOSE 157* -- --  CALCIUM 9.9 9.5 9.4  PHOS 3.9 4.6 5.0*     Basename 07/11/12 0655 07/09/12 1400  WBC 8.2 11.5*  HGB 10.7* 10.2*  HCT 33.4* 31.2*  PLT 360 380     I have reviewed the patient's current medications. Scheduled:   . amLODipine  10 mg Oral QHS  . aspirin  81 mg Oral Daily  . atorvastatin  10 mg Oral QPC supper  . benzonatate  200 mg Oral TID  . chlorpheniramine-HYDROcodone  5 mL Oral TID  . cloNIDine  0.2 mg Transdermal Weekly  . colchicine  0.6 mg Oral Daily  . darbepoetin (ARANESP) injection - DIALYSIS  100 mcg Intravenous Q Wed-HD  . doxercalciferol  3.5 mcg Intravenous Q M,W,F-HD  . doxercalciferol  3.5 mcg Intravenous Once  . ferric gluconate (FERRLECIT/NULECIT) IV  125 mg Intravenous Q M,W,F-HD  . fluticasone  2 spray Each Nare Daily  . lanthanum  1,000 mg Oral TID PC  . losartan  100 mg Oral Daily  . multivitamin  1 tablet Oral Daily  . pantoprazole  40 mg Oral BID AC  . Ticagrelor  90 mg Oral BID  . vancomycin  500 mg Intravenous Q M,W,F-HD   Assessment/Plan: 1. Cough and chest wall pain- coughing up less phlegm. Feels better Vanco 1g IV given post HD on 30 Dec. 1/2 blood cultures +for GPC.  To get 2 wk total vanco 500 mg each HD.  I spoke to [name], Therapist, sports at [facility] for antibiotics total 2 weeks, decreased EDW, surveillance blood cult 2 weeks post d/c of antibiotics 2. ESRD- Normally MWF . I called [facility] and told them to decrease EDW to 60 kg.  Dialyze today to weight 59 kg 3. Anemia: cont with aranesp 100/wk 4. CAD s/p CABG- per cardiol and TCTS 5. Nutrition:cont to encourage po intake 6. Hypertension-- on losartan 100/d,amlodipine 10/d, clonidine TTS 2/wk. BP 152/64 today 7. Secondary PTH--usually on hectorol 3.5 mcg each HD. Will give today, then with each HD 8. Home 2 Dec    LOS: 8 days   Krystelle Prashad F 07/11/2012,11:04 AM   .labalb

## 2012-07-11 NOTE — Progress Notes (Signed)
Patient ID: Shawna [name], female   DOB: 1941-01-28, 72 y.o.   MRN: [id number]                   [address]            [place] [address]          [phone]          LOS: 8 days   Subjective: Feels better, complaints are chest wall and neck discomfort with movement, not anginal. Cough is better  Objective: Vital signs in last 24 hours: Patient Vitals for the past 24 hrs:  BP Temp Temp src Pulse Resp SpO2 Weight  07/11/12 0527 161/68 mmHg 98.3 F (36.8 C) Oral 91  - 96 % 133 lb 9.6 oz (60.6 kg)  07/10/12 2102 170/68 mmHg 98.5 F (36.9 C) Oral 98  18  95 % -  07/10/12 1400 150/60 mmHg 98.4 F (36.9 C) Oral 95  18  95 % -    Filed Weights   07/09/12 2056 07/10/12 0635 07/11/12 0527  Weight: 129 lb 13.6 oz (58.9 kg) 131 lb 9.8 oz (59.7 kg) 133 lb 9.6 oz (60.6 kg)    Hemodynamic parameters for last 24 hours:    Intake/Output from previous day: 12/31 0701 - 01/01 0700 In: 530 [P.O.:530] Out: 0  Intake/Output this shift:    Scheduled Meds:   . amLODipine  10 mg Oral QHS  . aspirin  81 mg Oral Daily  . atorvastatin  10 mg Oral QPC supper  . benzonatate  200 mg Oral TID  . chlorpheniramine-HYDROcodone  5 mL Oral TID  . cloNIDine  0.2 mg Transdermal Weekly  . colchicine  0.6 mg Oral Daily  . darbepoetin (ARANESP) injection - DIALYSIS  100 mcg Intravenous Q Wed-HD  . doxercalciferol  3.5 mcg Intravenous Q M,W,F-HD  . doxercalciferol  3.5 mcg Intravenous Once  . ferric gluconate (FERRLECIT/NULECIT) IV  125 mg Intravenous Q M,W,F-HD  . fluticasone  2 spray Each Nare Daily  . lanthanum  1,000 mg Oral TID PC  . losartan  100 mg Oral Daily  . multivitamin  1 tablet Oral Daily  . pantoprazole  40 mg Oral BID AC  . Ticagrelor  90 mg Oral BID  . vancomycin  500 mg Intravenous Q M,W,F-HD   Continuous Infusions:   . sodium chloride Stopped (07/05/12 0745)   PRN Meds:.acetaminophen, albuterol, ALPRAZolam, lidocaine-prilocaine, loratadine, magnesium  hydroxide, nitroGLYCERIN, ondansetron (ZOFRAN) IV, traMADol, zolpidem  General appearance: alert and cooperative Neurologic: intact Heart: regular rate and rhythm, S1, S2 normal, no murmur, click, rub or gallop and normal apical impulse Lungs: clear to auscultation bilaterally Abdomen: soft, non-tender; bowel sounds normal; no masses,  no organomegaly Extremities: extremities normal, atraumatic, no cyanosis or edema and Homans sign is negative, no sign of DVT Wound: sternum stable and well healed without evidence of infection  Lab Results: CBC: Basename 07/11/12 0655 07/09/12 1400  WBC 8.2 11.5*  HGB 10.7* 10.2*  HCT 33.4* 31.2*  PLT 360 380   BMET:  Basename 07/11/12 0823 07/09/12 1400  NA 132* 132*  K 5.0 5.5*  CL 90* 89*  CO2 26 27  GLUCOSE 157* 102*  BUN 41* 49*  CREATININE 7.18* 9.08*  CALCIUM 9.9 9.5    PT/INR: No results found for this basename: LABPROT,INR in the last 72 hours   Radiology Dg Chest 2 View  07/08/2012  *RADIOLOGY REPORT*  Clinical Data: Cough.  Fever and chest  pain.  CHEST - 2 VIEW  Comparison: 07/04/2012  Findings: Status post median sternotomy and CABG procedure. Small pleural  effusions appears similar to previous exam.  Bibasilar atelectasis noted in the lung bases.  There are no airspace consolidation identified.  IMPRESSION: Persistent small pleural effusions and bibasilar atelectasis.   Original Report Authenticated By: Kerby Moors, M.D.    CAssessment/Plan: S/P  CABG 11/29  Wounds healing well Nothing to add to current care.   Grace Isaac MD 07/11/2012 11:15 AM

## 2012-07-12 ENCOUNTER — Telehealth: Payer: Self-pay | Admitting: Physician Assistant

## 2012-07-12 ENCOUNTER — Encounter (HOSPITAL_COMMUNITY): Payer: Self-pay | Admitting: Cardiology

## 2012-07-12 MED ORDER — FLUCONAZOLE 150 MG PO TABS
150.0000 mg | ORAL_TABLET | Freq: Once | ORAL | Status: AC
  Administered 2012-07-12: 150 mg via ORAL
  Filled 2012-07-12: qty 1

## 2012-07-12 MED ORDER — CLONIDINE HCL 0.2 MG/24HR TD PTWK: 1.0000 | MEDICATED_PATCH | TRANSDERMAL | Status: DC

## 2012-07-12 MED ORDER — COLCHICINE 0.6 MG PO TABS: 0.3000 mg | ORAL_TABLET | ORAL | Status: DC

## 2012-07-12 MED ORDER — LOSARTAN POTASSIUM 100 MG PO TABS: 100.0000 mg | ORAL_TABLET | Freq: Every day | ORAL | Status: DC

## 2012-07-12 MED ORDER — BENZONATATE 200 MG PO CAPS: 200.0000 mg | ORAL_CAPSULE | Freq: Three times a day (TID) | ORAL | Status: DC

## 2012-07-12 MED ORDER — VANCOMYCIN HCL 500 MG IV SOLR
500.0000 mg | INTRAVENOUS | Status: DC
  Filled 2012-07-12: qty 500

## 2012-07-12 MED ORDER — HYDROCOD POLST-CHLORPHEN POLST 10-8 MG/5ML PO LQCR
5.0000 mL | Freq: Three times a day (TID) | ORAL | Status: DC
Start: 2012-07-12 — End: 2012-07-26

## 2012-07-12 MED ORDER — TRAMADOL HCL 50 MG PO TABS: 50.0000 mg | ORAL_TABLET | Freq: Four times a day (QID) | ORAL | Status: DC | PRN

## 2012-07-12 NOTE — Telephone Encounter (Signed)
New message:  They would like to get a copy of the positive blood cultures that was done at the hospital recently.  Please fax them to [phone] attention [facility].  They have called the hospital and they were told to call here.

## 2012-07-12 NOTE — Progress Notes (Signed)
Cardiology Progress Note Patient Name: Shawna [name] Date of Encounter: 07/12/2012, 6:58 AM     Subjective  Patient reports intermittent right sided chest pain with cough and movement, but currently pain free. No shortness of breath. C/o yeast infection.    Objective   Telemetry: sinus tachycardia 90-120s  Medications: . amLODipine  10 mg Oral QHS  . aspirin  81 mg Oral Daily  . atorvastatin  10 mg Oral QPC supper  . benzonatate  200 mg Oral TID  . chlorpheniramine-HYDROcodone  5 mL Oral TID  . cloNIDine  0.2 mg Transdermal Weekly  . colchicine  0.6 mg Oral Daily  . darbepoetin (ARANESP) injection - DIALYSIS  100 mcg Intravenous Q Wed-HD  . doxercalciferol  3.5 mcg Intravenous Q M,W,F-HD  . doxercalciferol  3.5 mcg Intravenous Once  . ferric gluconate (FERRLECIT/NULECIT) IV  125 mg Intravenous Q M,W,F-HD  . fluticasone  2 spray Each Nare Daily  . lanthanum  1,000 mg Oral TID PC  . losartan  100 mg Oral Daily  . multivitamin  1 tablet Oral Daily  . pantoprazole  40 mg Oral BID AC  . Ticagrelor  90 mg Oral BID  . vancomycin  500 mg Intravenous Q M,W,F-HD    Physical Exam: Temp:  [97.5 F (36.4 C)-98.8 F (37.1 C)] 98.4 F (36.9 C) (01/02 0643) Pulse Rate:  [104-114] 104  (01/02 0643) Resp:  [15-23] 18  (01/02 0643) BP: (138-175)/(59-83) 175/59 mmHg (01/02 0643) SpO2:  [93 %-100 %] 93 % (01/02 0643) Weight:  [129 lb 13.6 oz (58.9 kg)-134 lb 4.2 oz (60.9 kg)] 129 lb 13.6 oz (58.9 kg) (01/01 1602)  General: Elderly black female, in no acute distress. Head: Normocephalic, atraumatic, sclera non-icteric, nares are without discharge.  Neck: Supple. No JVD Lungs: Clear bilaterally to auscultation without wheezes, rales, or rhonchi. Breathing is unlabored. Heart: RRR S1 S2 without murmurs, rubs, or gallops.  Chest: Sternal incision well healed Abdomen: Soft, non-tender, non-distended with normoactive bowel sounds. No rebound/guarding. No obvious abdominal masses. Msk:   Strength and tone appear normal for age. Extremities: No edema. No clubbing or cyanosis. Distal pedal pulses are intact and equal bilaterally. Neuro: Alert and oriented X 3. Moves all extremities spontaneously. Psych:  Responds to questions appropriately with a normal affect.   Intake/Output Summary (Last 24 hours) at 07/12/12 0658 Last data filed at 07/12/12 0600  Gross per 24 hour  Intake    240 ml  Output   2400 ml  Net  -2160 ml    Labs:  [facility] 07/11/12 0823 07/09/12 1400  NA 132* 132*  K 5.0 5.5*  CL 90* 89*  CO2 26 27  GLUCOSE 157* 102*  BUN 41* 49*  CREATININE 7.18* 9.08*  CALCIUM 9.9 9.5  PHOS 3.9 4.6   Basename 07/11/12 0823 07/09/12 1400  ALBUMIN 2.8* 2.9*   Basename 07/11/12 0655 07/09/12 1400  WBC 8.2 11.5*  HGB 10.7* 10.2*  HCT 33.4* 31.2*  MCV 90.3 90.7  PLT 360 380   Radiology/Studies:    07/08/2012  - CHEST - 2 VIEW Findings: Status post median sternotomy and CABG procedure. Small pleural  effusions appears similar to previous exam.  Bibasilar atelectasis noted in the lung bases.  There are no airspace consolidation identified.  IMPRESSION: Persistent small pleural effusions and bibasilar atelectasis.     07/04/2012  - NUCLEAR MEDICINE VENTILATION - PERFUSION LUNG SCAN   Findings:  Ventilation:   No focal ventilation defect.  Perfusion:   No wedge shaped peripheral perfusion defects to suggest acute pulmonary embolism  IMPRESSION: Negative exam.        Assessment and Plan   1. Chest wall pain: VQ negative for PE. Cont antitussives, colchicine and PRN tramadol 2. Bacteremia: 1 of 2 Blood Cx w/ gram (+) cocci in clusters. ? Contaminant. Afebrile x 2 days. Leukocytosis resolved. Received one dose Vancomycin 12/31. Per Nephrology will get a total of 2 weeks of Vanco with HD (MWF) followed by surveillance blood cultures 2 weeks post dc. 3. CAD: s/p CABG 11/29. Stable. Cont ASA, Brilinta (previous PCI), and statin 4. Hypertension: SBP elevated 140-170s  on losartan 100mg  daily, amlodipine 10mg  daily, clonidine 0.2mg  patch weekly. Med titration per MD. 5. ESRD: HD MWF per nephrology 6. Yeast Infection: Patient reports vaginal itching and white discharge. Treat with diflucan  Disposition: Possibly back to [facility] today pending evaluation by MD.   Signed, HOPE, JESSICA PA-C  History and all data above reviewed.  Patient examined.  I agree with the findings as above.  Still with some mild chest pain.  She is ready to go back to rehab however.  The patient exam reveals COR:RRR, no rub  ,  Lungs: Few decreased breath sounds, no crackles  ,  Abd: Positive bowel sounds, no rebound no guarding, Ext No edema  .  All available labs, radiology testing, previous records reviewed. Agree with documented assessment and plan. OK to discharge today.  Plan as above.  Vanc will be administered at dialysis.  Continue current antihypertensives.  She can be seen in the [place] office by our PA or Dr. Domenic Polite in 10 - 14 days.  Minus Breeding  8:33 AM  07/12/2012

## 2012-07-12 NOTE — Progress Notes (Signed)
CARDIAC REHAB PHASE I   PRE:  Rate/Rhythm: 105 ST  BP:  Supine: 140/60  Sitting:  Standing:    SaO2: 98 RA  MODE:  Ambulation: 500 ft   POST:  Rate/Rhythem: 115  BP:  Supine:   Sitting: 150/80  Standing:    SaO2: 98 RA [id number] Assisted X 1 and used walker to ambulate. Pt c/o of feeling sleepy and of pain in right side of chest. She states that this pain has been constant and is worse with movement and coughing. She was able to walk 5oo feet VS stable. Pt back to bed after walk with call light in reach and husband present.   Shawna [name]

## 2012-07-12 NOTE — Progress Notes (Signed)
[facility]  Subjective:  Awake, alert, smiling, friendly, looking forward to going home Dialyzed yesterday--wt post HD 58.9 kg   Objective: Vital signs in last 24 hours: Blood pressure 175/59, pulse 104, temperature 98.4 F (36.9 C), temperature source Oral, resp. rate 18, height 5\' 1"  (1.549 m), weight 58.9 kg (129 lb 13.6 oz), SpO2 93.00%.    PHYSICAL EXAM *General--as above  Chest--sternotomy clean, clear  Heart--no rub  Abd--nontender  Extr--no edema, AVF patent L upper arm    Lab Results:   Lab 07/11/12 0823 07/09/12 1400 07/07/12 0815  NA 132* 132* 135  K 5.0 5.5* 3.8  CL 90* 89* 93*  CO2 26 27 26   BUN 41* 49* 42*  CREATININE 7.18* 9.08* 7.86*  ALB -- -- --  GLUCOSE 157* -- --  CALCIUM 9.9 9.5 9.4  PHOS 3.9 4.6 5.0*     Basename 07/11/12 0655 07/09/12 1400  WBC 8.2 11.5*  HGB 10.7* 10.2*  HCT 33.4* 31.2*  PLT 360 380     I have reviewed the patient's current medications. Scheduled:   . amLODipine  10 mg Oral QHS  . aspirin  81 mg Oral Daily  . atorvastatin  10 mg Oral QPC supper  . benzonatate  200 mg Oral TID  . chlorpheniramine-HYDROcodone  5 mL Oral TID  . cloNIDine  0.2 mg Transdermal Weekly  . colchicine  0.6 mg Oral Daily  . darbepoetin (ARANESP) injection - DIALYSIS  100 mcg Intravenous Q Wed-HD  . doxercalciferol  3.5 mcg Intravenous Q M,W,F-HD  . doxercalciferol  3.5 mcg Intravenous Once  . ferric gluconate (FERRLECIT/NULECIT) IV  125 mg Intravenous Q M,W,F-HD  . fluconazole  150 mg Oral Once  . fluticasone  2 spray Each Nare Daily  . lanthanum  1,000 mg Oral TID PC  . losartan  100 mg Oral Daily  . multivitamin  1 tablet Oral Daily  . pantoprazole  40 mg Oral BID AC  . Ticagrelor  90 mg Oral BID  . vancomycin  500 mg Intravenous Q M,W,F-HD  . vancomycin  500 mg Intravenous STAT     Assessment/Plan: 1. Cough and chest wall pain- coughing up less phlegm. Feels better Vanco 1g IV given post HD on 30 Dec. 1/2 blood  cultures +for GPC. To get 2 wk total vanco 500 mg each HD. I spoke to [place], Therapist, sports at [facility] for antibiotics total 2 weeks, decreased EDW, surveillance blood cult 2 weeks post d/c of antibiotics 2. ESRD- Normally MWF . I called [facility] and told them to decrease EDW to 60 kg. Dialyzed yesterday to weight 58.9 kg (in pajamas) 3. Anemia: cont with aranesp 100/wk 4. CAD s/p CABG- per cardiol and TCTS 5. Nutrition:cont to encourage po intake 6. Hypertension-- on losartan 100/d,amlodipine 10/d, clonidine TTS 2/wk. BP 151/71 today 7. Secondary PTH--usually on hectorol 3.5 mcg each HD. Will give today, then with each HD 8. Home today    LOS: 9 days   Dameer Speiser F 07/12/2012,10:17 AM   .labalb

## 2012-07-12 NOTE — Care Management Note (Signed)
    Page 1 of 1   07/12/2012     3:08:00 PM   CARE MANAGEMENT NOTE 07/12/2012  Patient:  Shawna [name], Shawna [name]   Account Number:  [id number]  Date Initiated:  07/05/2012  Documentation initiated by:  Taheerah Guldin  Subjective/Objective Assessment:   PT ADM S/P RECENT CABG WITH CHEST PAIN, URI SYMPTOMS.  PTA, PT WAS RESIDING AT [facility].     Action/Plan:   CSW CONSULTED TO FACILITATE RETURN TO SNF WHEN MEDICALLY STABLE FOR DC.   Anticipated DC Date:  07/12/2012   Anticipated DC Plan:  SKILLED NURSING FACILITY  In-house referral  Clinical Social Worker      DC Planning Services  CM consult      Choice offered to / List presented to:             Status of service:  Completed, signed off Medicare Important Message given?   (If response is "NO", the following Medicare IM given date fields will be blank) Date Medicare IM given:   Date Additional Medicare IM given:    Discharge Disposition:  [place]  Per UR Regulation:  Reviewed for med. necessity/level of care/duration of stay  If discussed at [place] of Stay Meetings, dates discussed:   07/10/2012    Comments:  07/12/12 Bracen Schum,RN,BSN [id number] PT DISCHARGED BACK TO [facility], PER CSW ARRANGEMENTS.

## 2012-07-12 NOTE — Clinical Social Work Note (Signed)
Clinical Social Worker facilitated discharge by contacting family and facility, [facility]. CSW left multiple messages for Alben Deeds, admissions coordinator regarding discharge today. Patient's discharge packet will be given to patient as patient's family is planning to transport patient back to facility. CSW will sign off, as social work intervention is no longer needed.   Leandro Reasoner MSW, [place] ([place]) [phone]

## 2012-07-12 NOTE — Discharge Summary (Signed)
Patient seen and examined.  Plan as discussed in my rounding note for today and outlined above. Jeneen Rinks [facility]  07/12/2012  11:54 AM    Discharge Summary   Patient ID: ANJANAE HASSAN MRN: [id number], DOB/AGE: 01-02-1941 72 y.o.  Primary MD: [name], MD Primary Cardiologist: Dr. Domenic Polite Admit date: 07/03/2012 D/C date:     07/12/2012      Primary Discharge Diagnoses:  1. Chest wall pain  - Related to cough and recent CABG  - Treated with antitussives, colchicine and tramadol  2. Bacteremia  - 1 out of 2 blood cultures with gram + cocci in clusters  - Will receive two weeks of vancomycin with HD   - Recs for surveillance blood cultures two weeks after abx completed  3. Hypertension  - Meds titrated as below  4. ESRD  - HD MWF in [place]  5. Vaginal Yeast infection  - Treated with diflucan  Secondary Discharge Diagnoses:  . Chronic bronchitis   . GERD (gastroesophageal reflux disease)   . PUD (peptic ulcer disease)   . History of lower GI bleeding   . Arthritis   . History of gout   . CAD (coronary artery disease)     a. 12/2011 NSTEMI/Cath/PCI LCX (2.25x14 Resolute DES) & D1 (2.25x22 Resolute DES);  b. 01/2012 Cath/PCI: LM 30, LAD 30p, 40-33m, D1 stent ok, 99 in sm branch of diag, LCX patent stent, OM1 20, RCA 95 ost (4.0x12 Promus DES), EF 55%;  c. 04/2012 Lexi Cardiolite  EF 48%, small area of scar @ base/mid inflat wall with mild peri-infarct ischemia.; CABG 12/4  . High cholesterol 12/2011  . Chronic diastolic CHF (congestive heart failure)     a. 02/2012 Echo EF 60-65%, nl wall motion, Gr 1 DD, mod MR  . Pneumonia ~ 2009  . Iron deficiency anemia   . TIA (transient ischemic attack)   . Anxiety   . History of blood transfusion 07/2011; 12/2011; 01/2012 X 2; 04/2012  . Carotid artery disease     a. [id number] LICA, [id number]   . Mitral regurgitation     a. Moderate by echo, 02/2012  . Myocardial infarction   . Ovarian cancer 1992  . Colon cancer 1992      Allergies Allergies  Allergen Reactions  . Aspirin Other (See Comments)    Mess up her stomach; "makes my bowels have blood in them". Takes 81 mg EC Aspirin   . Contrast Media (Iodinated Diagnostic Agents) Itching  . Iron Itching and Other (See Comments)    "they gave me iron in dialysis; had to give me Benadryl cause I had to have the iron" (05/02/2012)  . Macrodantin (Nitrofurantoin Macrocrystal) Other (See Comments)    "broke me out in big old knots all over my body; had to go to ER"  . Penicillins Other (See Comments)    "makes me real weak when I take it; like I'll pass out"  . Plavix (Clopidogrel Bisulfate) Rash  . Bactrim (Sulfamethoxazole W-Trimethoprim) Rash  . Sulfa Antibiotics Rash  . Venofer (Ferric Oxide) Itching    Patient reports using Benadryl prior to doses as Annetta    Diagnostic Studies/Procedures:   07/04/2012 - NUCLEAR MEDICINE VENTILATION - PERFUSION LUNG SCAN  Findings: Ventilation: No focal ventilation defect. Perfusion: No wedge shaped peripheral perfusion defects to suggest acute pulmonary embolism IMPRESSION: Negative exam.  History of Present Illness: 72 y.o. female w/ the above medical problems who transferred from [facility] to [facility] on 07/03/12 with complaints of chest pain. She underwent CABG on 12/4 and was discharged on 12/11 to a rehab center. Since discharge she had been treated with antibiotics for an URI, but has had continued problems with a cough. On day of presentation she developed severe right sided chest pain prompting her to seek medical attention.   Hospital Course: At [facility] showed a resolved right pleural effusion, small left pleural effusion, no infiltrate. EKG was without acute ischemic changes.  Labs were significant for mildly elevated WBC, normal neutrophils, normal BNP, and mild anemia. She was transferred to [facility] for further evaluation and treatment. DDimer was found elevated at 1.97 for  which a VQ scan was performed demonstrating no PE. It was felt her pain was musculoskeletal due to cough and recent surgery. She was treated with antitussives, tramadol, and colchicine with gradual improvement in pain. She was continued on HD. BP meds were titrated for better BP control (see med list).   She developed a fever and leukocytosis for which blood cultures were obtained revealing 1 of 2 positive for gram (+) cocci in clusters. There was question of contamination. She was initiated on Vancomycin and will continue to receive for two weeks with dialysis. Infectious disease also recommended repeat blood cultures two weeks after completion of abx. She remained afebrile, but did complain of vaginal itching with white discharge for which she was treated with diflucan.  She was seen and evaluated by Dr. Percival Spanish who felt she was stable for discharge with plans for follow up as scheduled below. She will return to [facility].  Discharge Vitals: Blood pressure 175/59, pulse 104, temperature 98.4 F (36.9 C), temperature source Oral, resp. rate 18, height 5\' 1"  (1.549 m), weight 129 lb 13.6 oz (58.9 kg), SpO2 93.00%.  Labs: Component Value Date   WBC 8.2 07/11/2012   HGB 10.7* 07/11/2012   HCT 33.4* 07/11/2012   MCV 90.3 07/11/2012   PLT 360 07/11/2012    Lab 07/11/12 0823  NA 132*  K 5.0  CL 90*  CO2 26  BUN 41*  CREATININE 7.18*  CALCIUM 9.9  GLUCOSE 157*   Component Value Date   CHOL 129 07/07/2012   HDL 51 07/07/2012   LDLCALC 59 07/07/2012   TRIG 97 07/07/2012   Component Value Date   DDIMER 1.97* 07/03/2012     Discharge Medications     Medication List     As of 07/12/2012 10:36 AM    STOP taking these medications         cloNIDine 0.2 MG tablet   Commonly known as: CATAPRES      lisinopril 10 MG tablet   Commonly known as: PRINIVIL,ZESTRIL      TAKE these medications         acetaminophen 325 MG tablet   Commonly known as: TYLENOL   Take 650 mg by  mouth every 6 (six) hours as needed. For pain/fever      ALPRAZolam 0.25 MG tablet   Commonly known as: XANAX   Take 0.25-0.5 mg by mouth at bedtime as needed. For anxiety.      amLODipine 10 MG tablet   Commonly known as: NORVASC   Take 10 mg by mouth daily.      aspirin 81 MG chewable tablet   Chew 81 mg by mouth daily.      atorvastatin 10 MG tablet   Commonly known as: LIPITOR   Take 10 mg by mouth daily.  benzonatate 200 MG capsule   Commonly known as: TESSALON   Take 1 capsule (200 mg total) by mouth 3 (three) times daily. Until cough resolved      chlorpheniramine-HYDROcodone 10-8 MG/5ML Lqcr   Commonly known as: TUSSIONEX   Take 5 mLs by mouth 3 (three) times daily. Until cough resolved      cloNIDine 0.2 mg/24hr patch   Commonly known as: CATAPRES - Dosed in mg/24 hr   Place 1 patch (0.2 mg total) onto the skin once a week.      colchicine 0.6 MG tablet   Take 0.5 tablets (0.3 mg total) by mouth 2 (two) times a week.      fluticasone 50 MCG/ACT nasal spray   Commonly known as: FLONASE   Place 2 sprays into the nose daily.      folic acid 1 MG tablet   Commonly known as: FOLVITE   Take 1 mg by mouth daily.      folic acid-vitamin b complex-vitamin c-selenium-zinc 3 MG Tabs   Take 1 tablet by mouth daily.      guaifenesin 100 MG/5ML syrup   Commonly known as: ROBITUSSIN   Take 200 mg by mouth 4 (four) times daily as needed. For cough      lanthanum 1000 MG chewable tablet   Commonly known as: FOSRENOL   Chew 1,000 mg by mouth 3 (three) times daily after meals.      levalbuterol 0.63 MG/3ML nebulizer solution   Commonly known as: XOPENEX   Take 1 ampule by nebulization every 6 (six) hours as needed. For wheezing      lidocaine-prilocaine cream   Commonly known as: EMLA   Apply 1 application topically as needed. For pain      loratadine 10 MG tablet   Commonly known as: CLARITIN   Take 10 mg by mouth daily as needed. For allergies.      losartan  100 MG tablet   Commonly known as: COZAAR   Take 1 tablet (100 mg total) by mouth daily.      multivitamin with minerals Tabs   Take 1 tablet by mouth daily.      pantoprazole 20 MG tablet   Commonly known as: PROTONIX   Take 20 mg by mouth 2 (two) times daily.      Ticagrelor 90 MG Tabs tablet   Commonly known as: BRILINTA   Take 90 mg by mouth 2 (two) times daily.      traMADol 50 MG tablet   Commonly known as: ULTRAM   Take 1-2 tablets (50-100 mg total) by mouth every 6 (six) hours as needed for pain. For pain      vitamin C 500 MG tablet   Commonly known as: ASCORBIC ACID   Take 500 mg by mouth daily. For 28 days for wound healing      zinc gluconate 50 MG tablet   Take 50 mg by mouth daily. For 15 days for wound healing         Disposition   Discharge Orders    Future Appointments: Provider: Department: Dept Phone: Center:   07/19/2012 1:30 PM Grace Isaac, MD [facility] [phone] [facility]   07/25/2012 1:20 PM Aurora Mask, PA [address] [place] (near [place]) [phone] [email or web address]   12/13/2012 10:30 AM [facility] [facility] [phone] [facility]   12/13/2012 11:40 AM Princess Perna, NP [facility] [phone] [facility]  Future Orders Please Complete By Expires   Diet - low sodium heart healthy      Increase activity slowly        Follow-up Information    Follow up with SERPE, EUGENE, PA. On 07/25/2012. (1:20)    Contact information:   [facility] [address] [place] [address] [phone]           Outstanding Labs/Studies:  None  Duration of Discharge Encounter: Greater than 30 minutes including physician and PA time.  Signed, HOPE, JESSICA PA-C 07/12/2012, 10:36 AM  Patient seen and examined.  Plan as discussed in my rounding note for today and outlined above. Jeneen Rinks [facility]  07/12/2012  11:54 AM

## 2012-07-12 NOTE — Telephone Encounter (Signed)
Information faxed to number given as requested.

## 2012-07-14 LAB — CULTURE, BLOOD (ROUTINE X 2)

## 2012-07-17 ENCOUNTER — Other Ambulatory Visit: Payer: Self-pay | Admitting: *Deleted

## 2012-07-17 DIAGNOSIS — Z951 Presence of aortocoronary bypass graft: Secondary | ICD-10-CM

## 2012-07-19 ENCOUNTER — Ambulatory Visit
Admission: RE | Admit: 2012-07-19 | Discharge: 2012-07-19 | Disposition: A | Discharge status: Home / Self Care | Payer: Medicare Other | Source: Ambulatory Visit | Attending: Cardiothoracic Surgery | Admitting: Cardiothoracic Surgery

## 2012-07-19 ENCOUNTER — Ambulatory Visit: Payer: Medicare Other | Admitting: Physician Assistant

## 2012-07-19 ENCOUNTER — Encounter: Payer: Self-pay | Admitting: Cardiothoracic Surgery

## 2012-07-19 ENCOUNTER — Encounter: Payer: Self-pay | Admitting: *Deleted

## 2012-07-19 ENCOUNTER — Ambulatory Visit (INDEPENDENT_AMBULATORY_CARE_PROVIDER_SITE_OTHER): Payer: Self-pay | Admitting: Cardiothoracic Surgery

## 2012-07-19 VITALS — BP 127/72 | HR 97 | Resp 16 | Ht 61.0 in | Wt 135.0 lb

## 2012-07-19 DIAGNOSIS — I2510 Atherosclerotic heart disease of native coronary artery without angina pectoris: Secondary | ICD-10-CM

## 2012-07-19 DIAGNOSIS — Z951 Presence of aortocoronary bypass graft: Secondary | ICD-10-CM

## 2012-07-19 IMAGING — CR DG CHEST 2V
2 series · 2 of 2 positions shown · non-contrast
Comparison: Chest x-ray of [DATE]

CLINICAL DATA: CABG, follow-up, dialysis patient

CHEST - 2 VIEW

[w chest lat]
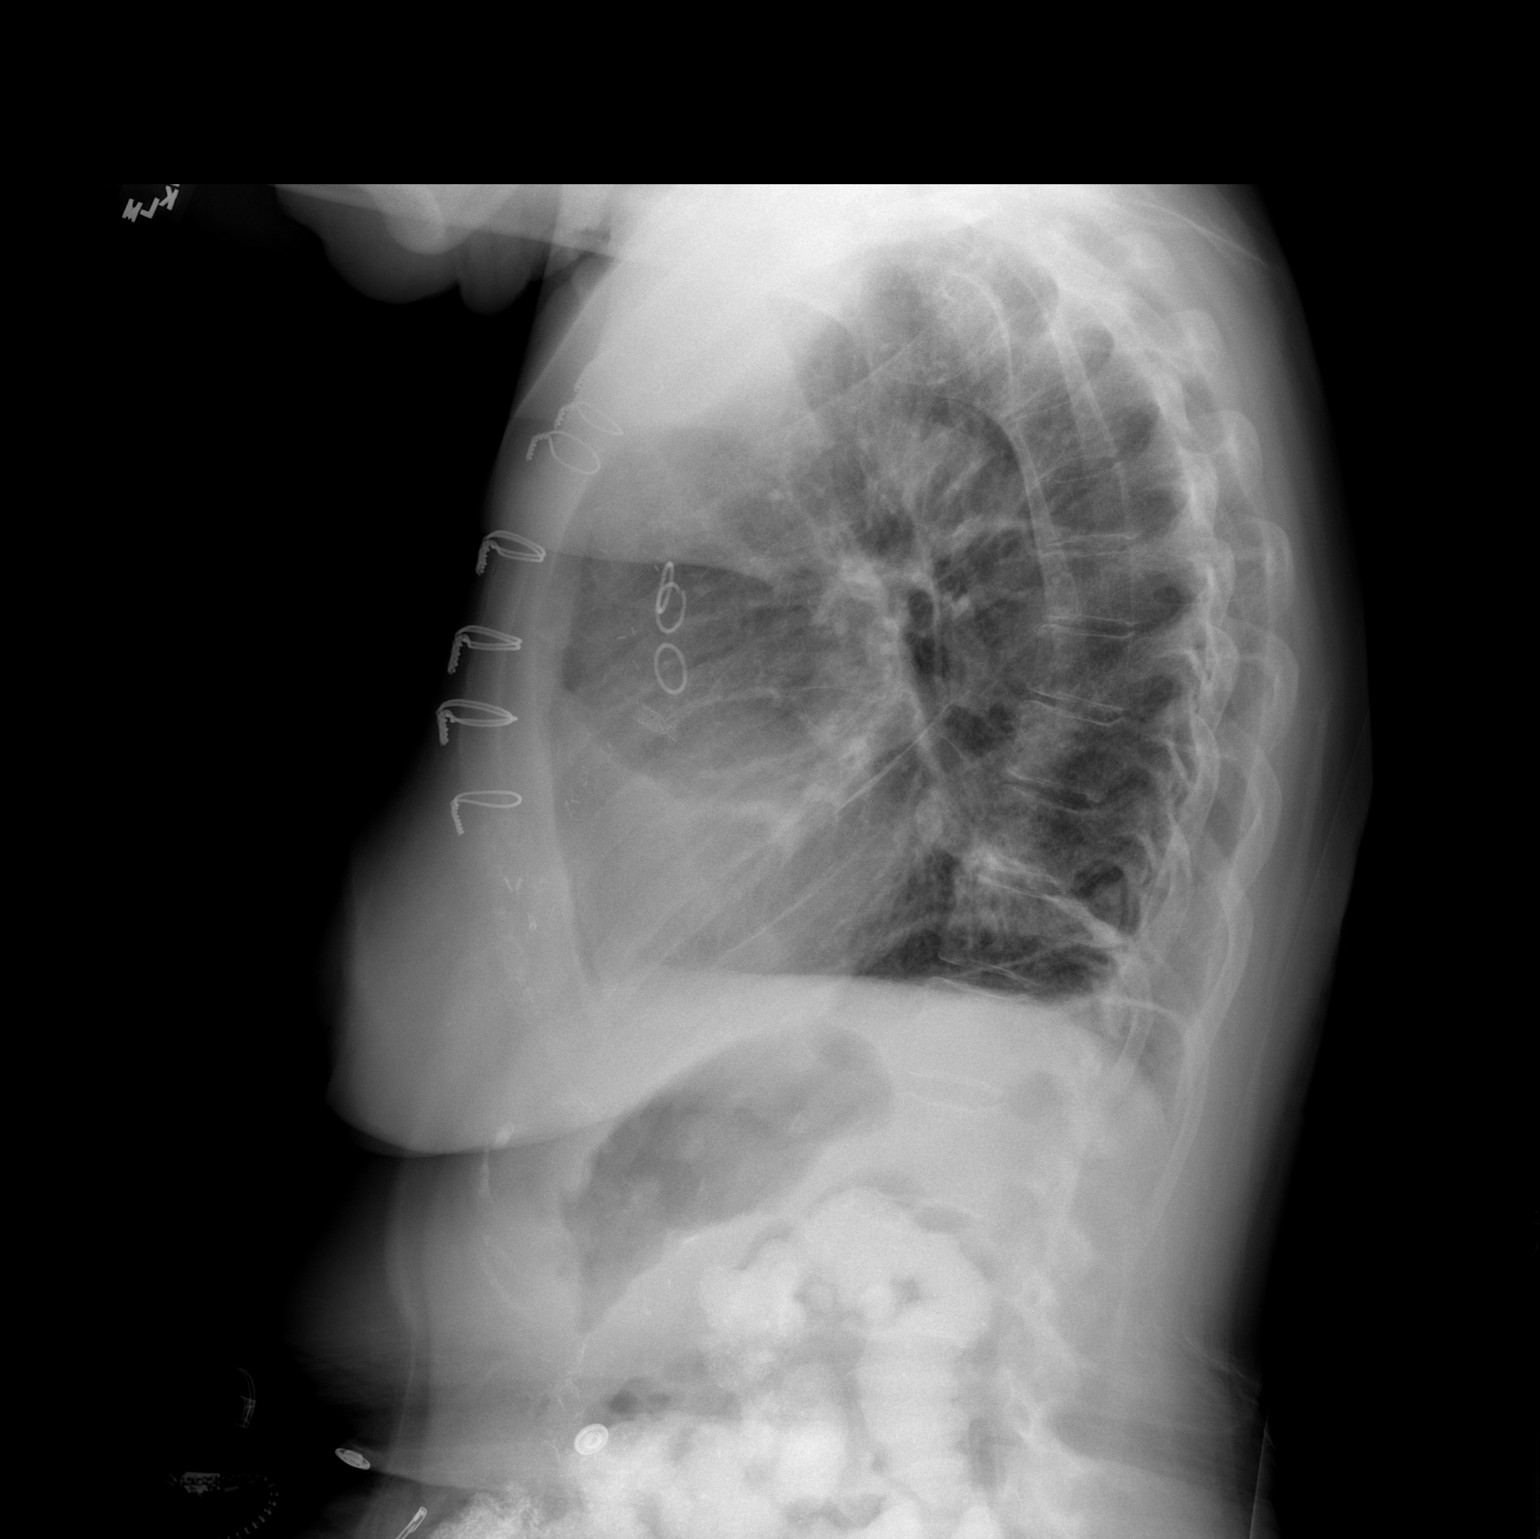

[w chest ap]
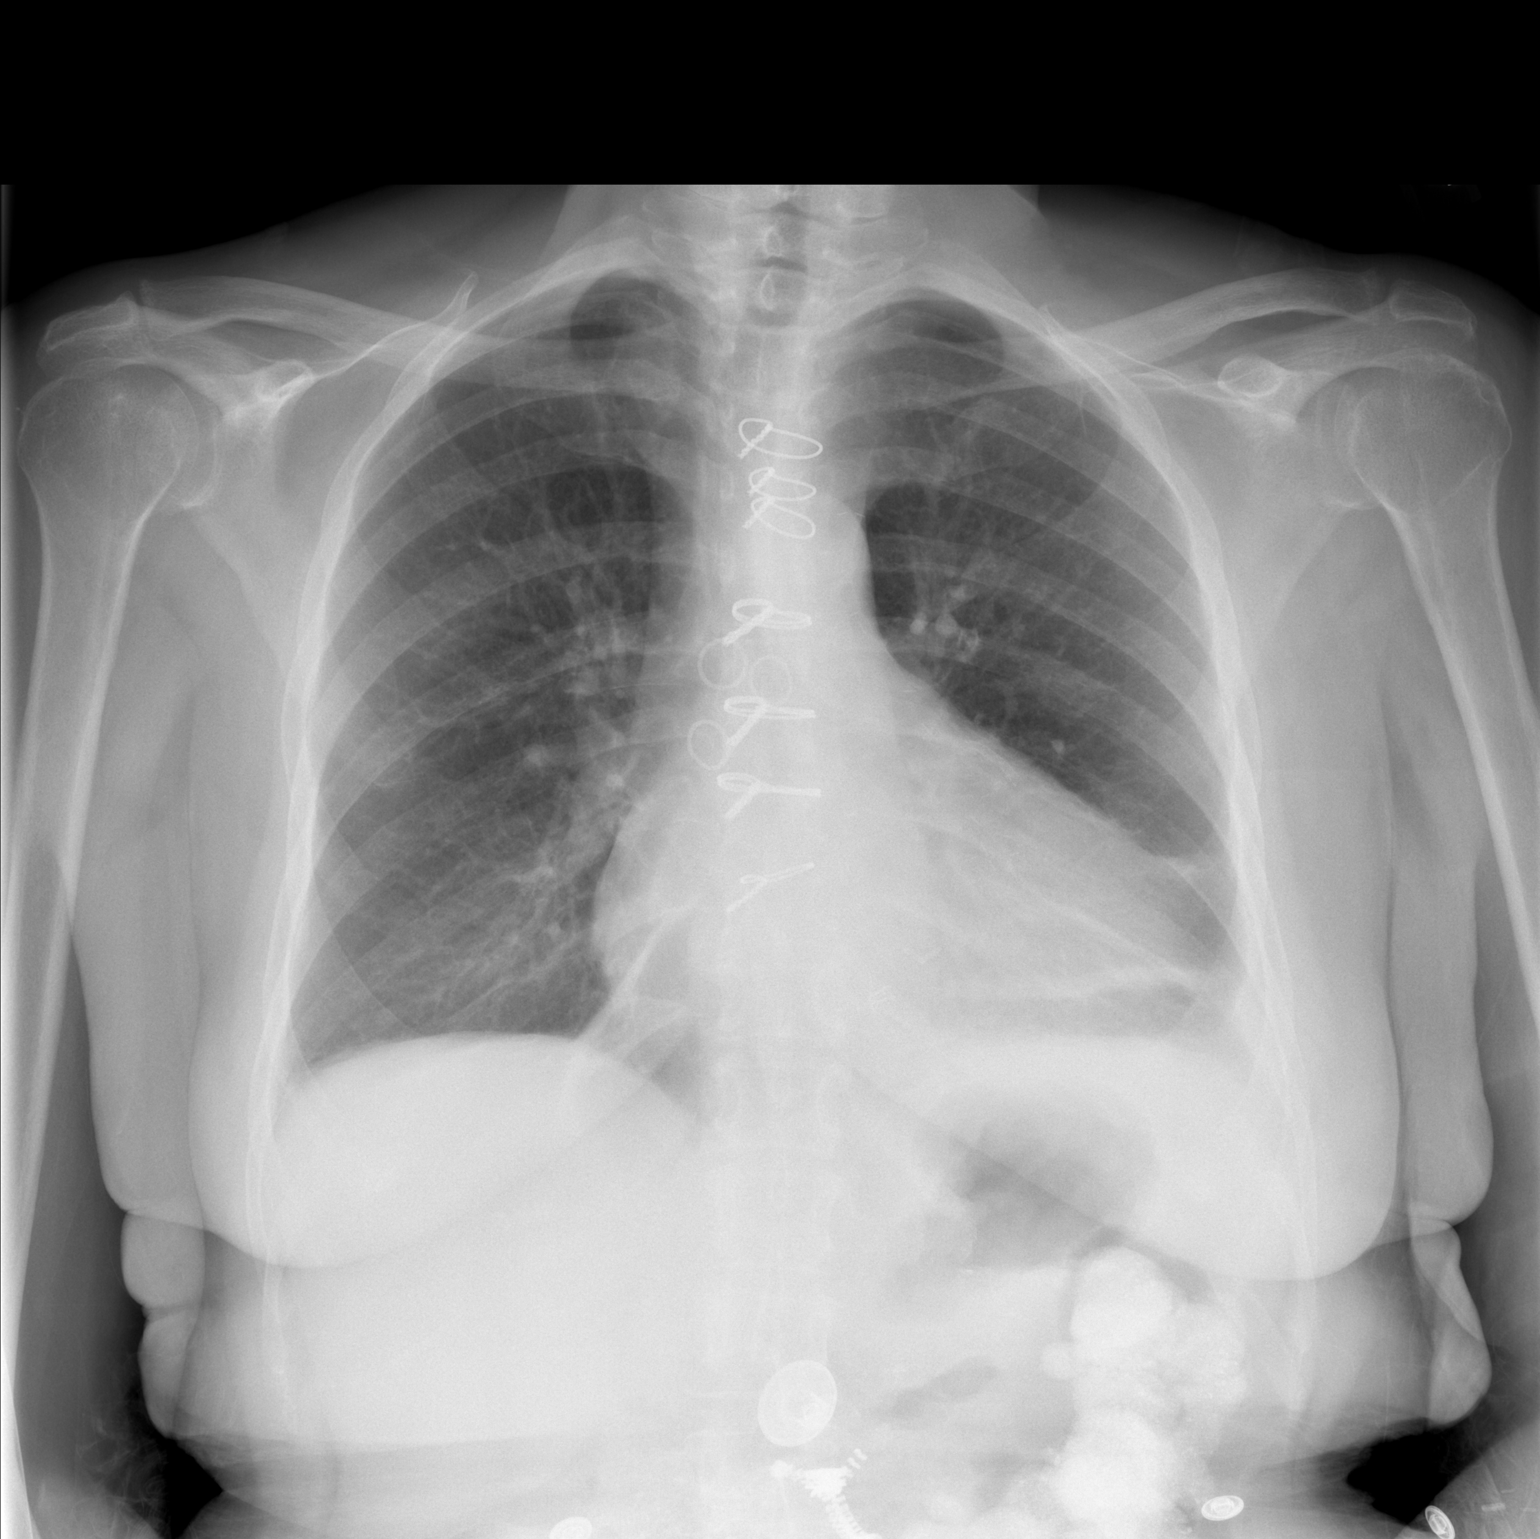

[2 of 2 positions shown; findings below may reference images not displayed]

FINDINGS: Aeration of the left lung base has improved somewhat, but
there is linear atelectasis remaining at the left base.  No
definite effusion is seen although a tiny left effusion is
difficult to exclude.  Cardiomegaly is stable.  Median sternotomy
sutures are noted
IMPRESSION: Slightly improved aeration with persistent left basilar linear
atelectasis and possible small left effusion.

## 2012-07-19 NOTE — Patient Instructions (Addendum)
Coronary Artery Bypass Grafting Care After Refer to this sheet in the next few weeks. These instructions provide you with information on caring for yourself after your procedure. Your caregiver may also give you more specific instructions. Your treatment has been planned according to current medical practices, but problems sometimes occur. Call your caregiver if you have any problems or questions after your procedure.  Recovery from open heart surgery will be different for everyone. Some people feel well after 3 or 4 weeks, while for others it takes longer. After heart surgery, it may be normal to:  Not have an appetite, feel nauseated by the smell of food, or only want to eat a small amount.  Be constipated because of changes in your diet, activity, and medicines. Eat foods high in fiber. Add fresh fruits and vegetables to your diet. Stool softeners may be helpful.  Feel sad or unhappy. You may be frustrated or cranky. You may have good days and bad days. Do not give up. Talk to your caregiver if you do not feel better.  Feel weakness and fatigue. You many need physical therapy or cardiac rehabilitation to get your strength back.  Develop an irregular heartbeat called atrial fibrillation. Symptoms of atrial fibrillation are a fast, irregular heartbeat or feelings of fluttery heartbeats, shortness of breath, low blood pressure, and dizziness. If these symptoms develop, see your caregiver right away. MEDICATION  Have a list of all the medicines you will be taking when you leave the hospital. For every medicine, know the following:  Name.  Exact dose.  Time of day to be taken.  How often it should be taken.  Why you are taking it.  Ask which medicines should or should not be taken together. If you take more than one heart medicine, ask if it is okay to take them together. Some heart medicines should not be taken at the same time because they may lower your blood pressure too  much.  Narcotic pain medicine can cause constipation. Eat fresh fruits and vegetables. Add fiber to your diet. Stool softener medicine may help relieve constipation.  Keep a copy of your medicines with you at all times.  Do not add or stop taking any medicine until you check with your caregiver.  Medicines can have side effects. Call your caregiver who prescribed the medicine if you:  Start throwing up, have diarrhea, or have stomach pain.  Feel dizzy or lightheaded when you stand up.  Feel your heart is skipping beats or is beating too fast or too slow.  Develop a rash.  Notice unusual bruising or bleeding. HOME CARE INSTRUCTIONS  After heart surgery, it is important to learn how to take your pulse. Have your caregiver show you how to take your pulse.  Use your incentive spirometer. Ask your caregiver how long after surgery you need to use it. Care of your chest incision  Tell your caregiver right away if you notice clicking in your chest (sternum).  Support your chest with a pillow or your arms when you take deep breaths and cough.  Follow your caregiver's instructions about when you can bathe or swim.  Protect your incision from sunlight during the first year to keep the scar from getting dark.  Tell your caregiver if you notice:  Increased tenderness of your incision.  Increased redness or swelling around your incision.  Drainage or pus from your incision. Care of your leg incision(s)  Avoid crossing your legs.  Avoid sitting for long periods of   time. Change positions every half hour.  Elevate your leg(s) when you are sitting.  Check your leg(s) daily for swelling. Check the incisions for redness or drainage.  Wear your elastic stockings as told by your caregiver. Take them off at bedtime. Diet  Diet is very important to heart health.  Eat plenty of fresh fruits and vegetables. Meats should be lean cut. Avoid canned, processed, and fried foods.  Talk to a  dietician. They can teach you how to make healthy food and drink choices. Weight  Weigh yourself every day. This is important because it helps to know if you are retaining fluid that may make your heart and lungs work harder.  Use the same scale each time.  Weigh yourself every morning at the same time. You should do this after you go to the bathroom, but before you eat breakfast.  Your weight will be more accurate if you do not wear any clothes.  Record your weight.  Tell your caregiver if you have gained 2 pounds or more overnight. Activity Stop any activity at once if you have chest pain, shortness of breath, irregular heartbeats, or dizziness. Get help right away if you have any of these symptoms.  Bathing.  Avoid soaking in a bath or hot tub until your incisions are healed.  Rest. You need a balance of rest and activity.  Exercise. Exercise per your caregiver's advice. You may need physical therapy or cardiac rehabilitation to help strengthen your muscles and build your endurance.  Climbing stairs. Unless your caregiver tells you not to climb stairs, go up stairs slowly and rest if you tire. Do not pull yourself up by the handrail.  Driving a car. Follow your caregiver's advice on when you may drive. You may ride as a passenger at any time. When traveling for long periods of time in a car, get out of the car and walk around for a few minutes every 2 hours.  Lifting. Avoid lifting, pushing, or pulling anything heavier than 10 pounds for 6 weeks after surgery or as told by your caregiver.  Returning to work. Check with your caregiver. People heal at different rates. Most people will be able to go back to work 6 to 12 weeks after surgery.  Sexual activity. You may resume sexual relations as told by your caregiver. SEEK MEDICAL CARE IF:  Any of your incisions are red, painful, or have any type of drainage coming from them.  You have an oral temperature above 102 F (38.9  C).  You have ankle or leg swelling.  You have pain in your legs.  You have weight gain of 2 or more pounds a day.  You feel dizzy or lightheaded when you stand up. SEEK IMMEDIATE MEDICAL CARE IF:  You have angina or chest pain that goes to your jaw or arms. Call your local emergency services right away.  You have shortness of breath at rest or with activity.  You have a fast or irregular heartbeat (arrhythmia).  There is a "clicking" in your sternum when you move.  You have numbness or weakness in your arms or legs. MAKE SURE YOU:  Understand these instructions.  Will watch your condition.  Will get help right away if you are not doing well or get worse. Document Released: 01/14/2005 Document Revised: 09/19/2011 Document Reviewed: 09/01/2010 [employer] Patient Information 2013 [employer].  

## 2012-07-19 NOTE — Progress Notes (Signed)
[address]            [place] [address]          [phone]       Shawna [name]  Medical Record [id number] Date of Birth: Jan 23, 1941  Shawna Bjornstad, MD [name], MD  Chief Complaint:   PostOp Follow Up Visit 06/13/2012    OPERATIVE REPORT  PREOPERATIVE DIAGNOSES: Coronary occlusive disease with left  ventricular dysfunction, inferior hypokinesis.  POSTOPERATIVE DIAGNOSES: Coronary occlusive disease with left  ventricular dysfunction, inferior hypokinesis.  SURGICAL PROCEDURE: Coronary artery bypass grafting x4 with a left  internal mammary to the left anterior descending coronary artery,  reverse saphenous vein graft to the distal right coronary artery,  reverse saphenous vein graft to the intermediate coronary artery and  reverse saphenous vein graft to the circumflex coronary artery with left  thigh and calf endovein harvesting.    History of Present Illness:      Was readmitted for rt chest wall pain and cough last week now back at rehab center in [place], No angina. Tolerating dialysis well. No SOB. Plans to go home tomorrow. To start cardiac rehab  In 2 weeks     History  Smoking status  . Never Smoker   Smokeless tobacco  . Never Used       Allergies  Allergen Reactions  . Aspirin Other (See Comments)    Mess up her stomach; "makes my bowels have blood in them". Takes 81 mg EC Aspirin   . Contrast Media (Iodinated Diagnostic Agents) Itching  . Iron Itching and Other (See Comments)    "they gave me iron in dialysis; had to give me Benadryl cause I had to have the iron" (05/02/2012)  . Macrodantin (Nitrofurantoin Macrocrystal) Other (See Comments)    "broke me out in big old knots all over my body; had to go to ER"  . Penicillins Other (See Comments)    "makes me real weak when I take it; like I'll pass out"  . Plavix (Clopidogrel Bisulfate) Rash  . Bactrim (Sulfamethoxazole W-Trimethoprim) Rash  .  Sulfa Antibiotics Rash  . Venofer (Ferric Oxide) Itching    Patient reports using Benadryl prior to doses as [facility]    Current Outpatient Prescriptions  Medication Sig Dispense Refill  . acetaminophen (TYLENOL) 325 MG tablet Take 650 mg by mouth every 6 (six) hours as needed. For pain/fever      . ALPRAZolam (XANAX) 0.25 MG tablet Take 0.25-0.5 mg by mouth at bedtime as needed. For anxiety.      Marland Kitchen amLODipine (NORVASC) 10 MG tablet Take 10 mg by mouth daily.      Marland Kitchen aspirin 81 MG chewable tablet Chew 81 mg by mouth daily.      Marland Kitchen atorvastatin (LIPITOR) 10 MG tablet Take 10 mg by mouth daily.      . benzonatate (TESSALON) 200 MG capsule Take 1 capsule (200 mg total) by mouth 3 (three) times daily. Until cough resolved  90 capsule  0  . chlorpheniramine-HYDROcodone (TUSSIONEX) 10-8 MG/5ML LQCR Take 5 mLs by mouth 3 (three) times daily. Until cough resolved  140 mL  0  . cloNIDine (CATAPRES - DOSED IN MG/24 HR) 0.2 mg/24hr patch Place 1 patch (0.2 mg total) onto the skin once a week.  4 patch  3  . colchicine 0.6 MG tablet Take 0.5  tablets (0.3 mg total) by mouth 2 (two) times a week. Until chest pain resolved  10 tablet  0  . fluticasone (FLONASE) 50 MCG/ACT nasal spray Place 2 sprays into the nose daily.      . folic acid (FOLVITE) 1 MG tablet Take 1 mg by mouth daily.      . folic acid-vitamin b complex-vitamin c-selenium-zinc (DIALYVITE) 3 MG TABS Take 1 tablet by mouth daily.      Marland Kitchen guaifenesin (ROBITUSSIN) 100 MG/5ML syrup Take 200 mg by mouth 4 (four) times daily as needed. For cough      . lanthanum (FOSRENOL) 1000 MG chewable tablet Chew 1,000 mg by mouth 3 (three) times daily after meals.      Marland Kitchen levalbuterol (XOPENEX) 0.63 MG/3ML nebulizer solution Take 1 ampule by nebulization every 6 (six) hours as needed. For wheezing      . lidocaine-prilocaine (EMLA) cream Apply 1 application topically as needed. For pain      . loratadine (CLARITIN) 10 MG tablet Take 10 mg by mouth daily as  needed. For allergies.      Marland Kitchen losartan (COZAAR) 100 MG tablet Take 1 tablet (100 mg total) by mouth daily.  30 tablet  3  . Multiple Vitamin (MULTIVITAMIN WITH MINERALS) TABS Take 1 tablet by mouth daily.      . pantoprazole (PROTONIX) 20 MG tablet Take 20 mg by mouth 2 (two) times daily.      . Ticagrelor (BRILINTA) 90 MG TABS tablet Take 90 mg by mouth 2 (two) times daily.      . traMADol (ULTRAM) 50 MG tablet Take 1-2 tablets (50-100 mg total) by mouth every 6 (six) hours as needed for pain. For pain  120 tablet  0  . vitamin C (ASCORBIC ACID) 500 MG tablet Take 500 mg by mouth daily. For 28 days for wound healing      . zinc gluconate 50 MG tablet Take 50 mg by mouth daily. For 15 days for wound healing           Physical Exam: BP 127/72  Pulse 97  Resp 16  Ht 5\' 1"  (1.549 m)  Wt 135 lb (61.236 kg)  BMI 25.51 kg/m2  SpO2 98%  General appearance: alert and cooperative Neurologic: intact Heart: regular rate and rhythm, S1, S2 normal, no murmur, click, rub or gallop and normal apical impulse Lungs: clear to auscultation bilaterally and normal percussion bilaterally Abdomen: soft, non-tender; bowel sounds normal; no masses,  no organomegaly Extremities: extremities normal, atraumatic, no cyanosis or edema and Homans sign is negative, no sign of DVT Wound: healing well, sternum stable   Diagnostic Studies & Laboratory data:         Recent Radiology Findings: Dg Chest 2 View  07/19/2012  *RADIOLOGY REPORT*  Clinical Data: CABG, follow-up, dialysis patient  CHEST - 2 VIEW  Comparison: Chest x-ray of 07/08/2012  Findings: Aeration of the left lung base has improved somewhat, but there is linear atelectasis remaining at the left base.  No definite effusion is seen although a tiny left effusion is difficult to exclude.  Cardiomegaly is stable.  Median sternotomy sutures are noted  IMPRESSION: Slightly improved aeration with persistent left basilar linear atelectasis and possible small  left effusion.   Original Report Authenticated By: Ivar Drape, M.D.       Recent Labs: Lab Results  Component Value Date   WBC 8.2 07/11/2012   HGB 10.7* 07/11/2012   HCT 33.4* 07/11/2012   PLT 360  07/11/2012   GLUCOSE 157* 07/11/2012   CHOL 129 07/07/2012   TRIG 97 07/07/2012   HDL 51 07/07/2012   LDLCALC 59 07/07/2012   ALT 8 07/04/2012   AST 13 07/04/2012   NA 132* 07/11/2012   K 5.0 07/11/2012   CL 90* 07/11/2012   CREATININE 7.18* 07/11/2012   BUN 41* 07/11/2012   CO2 26 07/11/2012   INR 1.34 06/13/2012   HGBA1C 4.8 06/12/2012      Assessment / Plan:    Doing well post CABG, no lifting for 3 months To start cardiac rehab in 1-2 weeks, Cardiology in [place] will arrange     Emiliano Welshans B 07/19/2012 1:22 PM

## 2012-07-25 ENCOUNTER — Ambulatory Visit: Payer: Medicare Other | Admitting: Physician Assistant

## 2012-07-26 ENCOUNTER — Ambulatory Visit (INDEPENDENT_AMBULATORY_CARE_PROVIDER_SITE_OTHER): Payer: Medicare Other | Admitting: Physician Assistant

## 2012-07-26 ENCOUNTER — Encounter: Payer: Self-pay | Admitting: Physician Assistant

## 2012-07-26 VITALS — BP 151/76 | HR 74 | Ht 61.0 in | Wt 134.0 lb

## 2012-07-26 DIAGNOSIS — E785 Hyperlipidemia, unspecified: Secondary | ICD-10-CM

## 2012-07-26 DIAGNOSIS — I2510 Atherosclerotic heart disease of native coronary artery without angina pectoris: Secondary | ICD-10-CM

## 2012-07-26 DIAGNOSIS — Z951 Presence of aortocoronary bypass graft: Secondary | ICD-10-CM

## 2012-07-26 DIAGNOSIS — R7881 Bacteremia: Secondary | ICD-10-CM

## 2012-07-26 MED ORDER — FOLIC ACID 1 MG PO TABS: 1.0000 mg | ORAL_TABLET | Freq: Every day | ORAL | Status: DC

## 2012-07-26 MED ORDER — LOSARTAN POTASSIUM 100 MG PO TABS: 100.0000 mg | ORAL_TABLET | Freq: Every day | ORAL | Status: DC

## 2012-07-26 NOTE — Patient Instructions (Addendum)
   Lab - blood cultures x 1 (1 set) - draw 2 weeks after last Vancomycyn dose Continue all current medications. Follow up in  3 months

## 2012-07-26 NOTE — Assessment & Plan Note (Signed)
Continue current dose Lipitor, with target LDL 70 or less, if feasible. Most recent LDL 59, this past December.

## 2012-07-26 NOTE — Assessment & Plan Note (Signed)
Will repeat one set of blood cultures 2 weeks after her last vancomycin dose, as recently recommended by ID team. Suspect that recent positive culture was secondary to contamination.

## 2012-07-26 NOTE — Assessment & Plan Note (Addendum)
Status post recent 4 vessel CABG, following presentation with NST EMI and findings of 3v CAD with ISR of RCA and CFX, as well as LAD disease progression; EF 40% with global HK, by cardiac catheterization. Patient recently cleared by surgery, and now presents with no current signs/symptoms suggestive of CHF. Patient to remain compliant with HD schedule, and we'll plan on reassessment of clinical status in 3 months, with Dr. Domenic Polite.

## 2012-07-26 NOTE — Progress Notes (Signed)
Primary Cardiologist: Johnny Bridge, MD   HPI: Post hospital followup from [facility], status post 4v CABG, 06/2012, by Dr. Servando Snare, following presentation with NSTEMI.   - Cardiac catheterization yielded 3v CAD with restenosis of the RCA and CFX stent sites, as well as LAD disease progression; EF 40%, global HK.  She underwent subsequent successful CABG with grafting of the L-LAD; S-RCA; S-RI; and, S-CFX.  She then returned to [facility] with CP, which was felt to be secondary to persistent cough and recent surgery. D-dimer elevated at 1.97, with negative VQ scan for PE. Of note, she did develop fevers/leukocytosis, and blood cultures were positive (1/2) for GPC in clusters, but with question of contamination. Nevertheless, she was placed on two-week course of vancomycin. Infectious Disease consult obtained, recommending repeat cultures 2 weeks after completion of antibiotics. She was transferred back to [facility].  Patient was just seen by Dr. Servando Snare for scheduled followup last week, and released from his service. She presents today reporting no symptoms suggestive of UAP or CHF. She has been having some yellowish tinged nasal drainage, but denies fever.   Twelve-lead EKG today, reviewed by me, indicates NSR 81 bpm   Allergies  Allergen Reactions  . Aspirin Other (See Comments)    Mess up her stomach; "makes my bowels have blood in them". Takes 81 mg EC Aspirin   . Contrast Media (Iodinated Diagnostic Agents) Itching  . Iron Itching and Other (See Comments)    "they gave me iron in dialysis; had to give me Benadryl cause I had to have the iron" (05/02/2012)  . Macrodantin (Nitrofurantoin Macrocrystal) Other (See Comments)    "broke me out in big old knots all over my body; had to go to ER"  . Penicillins Other (See Comments)    "makes me real weak when I take it; like I'll pass out"  . Plavix (Clopidogrel Bisulfate) Rash  . Bactrim (Sulfamethoxazole W-Trimethoprim) Rash  . Sulfa  Antibiotics Rash  . Venofer (Ferric Oxide) Itching    Patient reports using Benadryl prior to doses as [facility]    Current Outpatient Prescriptions  Medication Sig Dispense Refill  . acetaminophen (TYLENOL) 500 MG tablet Take 1,000 mg by mouth every 8 (eight) hours as needed.      . ALPRAZolam (XANAX) 0.5 MG tablet Take 0.5 mg by mouth at bedtime as needed.      Marland Kitchen amLODipine (NORVASC) 10 MG tablet Take 10 mg by mouth daily.      Marland Kitchen aspirin 81 MG chewable tablet Chew 81 mg by mouth daily.      Marland Kitchen atorvastatin (LIPITOR) 20 MG tablet Take 20 mg by mouth daily.      . benzonatate (TESSALON) 200 MG capsule Take 1 capsule (200 mg total) by mouth 3 (three) times daily. Until cough resolved  90 capsule  0  . chlorpheniramine-HYDROcodone (TUSSIONEX) 10-8 MG/5ML LQCR Take 5 mLs by mouth at bedtime as needed. Until cough resolved      . cloNIDine (CATAPRES - DOSED IN MG/24 HR) 0.3 mg/24hr Place 1 patch onto the skin once a week.      . diphenhydrAMINE (BENADRYL) 25 MG tablet Take 25 mg by mouth every 6 (six) hours as needed.      . fluticasone (FLONASE) 50 MCG/ACT nasal spray Place 2 sprays into the nose daily.      . folic acid (FOLVITE) 1 MG tablet Take 1 mg by mouth daily.      . folic acid-vitamin b complex-vitamin  c-selenium-zinc (DIALYVITE) 3 MG TABS Take 1 tablet by mouth daily.      Marland Kitchen guaiFENesin (MUCINEX) 600 MG 12 hr tablet Take 600 mg by mouth 2 (two) times daily as needed.      . hydrocortisone cream 1 % Apply 1 application topically 2 (two) times daily as needed.      Marland Kitchen lanthanum (FOSRENOL) 1000 MG chewable tablet Chew 1,000 mg by mouth 3 (three) times daily after meals.      . lidocaine-prilocaine (EMLA) cream Apply 1 application topically as needed. For pain      . loratadine (CLARITIN) 10 MG tablet Take 10 mg by mouth daily as needed. For allergies.      Marland Kitchen losartan (COZAAR) 100 MG tablet Take 1 tablet (100 mg total) by mouth daily.  30 tablet  3  . Multiple Vitamin (MULTIVITAMIN  WITH MINERALS) TABS Take 1 tablet by mouth daily.      Marland Kitchen NASAL SALINE NA Place 2 sprays into the nose as needed.      Marland Kitchen oxymetazoline (AFRIN) 0.05 % nasal spray Place 2 sprays into the nose 2 (two) times daily as needed.      . pantoprazole (PROTONIX) 20 MG tablet Take 20 mg by mouth 2 (two) times daily.      . polyethylene glycol (MIRALAX / GLYCOLAX) packet Take 17 g by mouth daily as needed.      . promethazine (PHENERGAN) 12.5 MG tablet Take 12.5 mg by mouth every 6 (six) hours as needed.      . Ticagrelor (BRILINTA) 90 MG TABS tablet Take 90 mg by mouth 2 (two) times daily.      . traMADol (ULTRAM) 50 MG tablet Take 50 mg by mouth every 6 (six) hours as needed. For pain      . vitamin C (ASCORBIC ACID) 500 MG tablet Take 500 mg by mouth daily.       Marland Kitchen zinc gluconate 50 MG tablet Take 50 mg by mouth daily. For 15 days for wound healing        Past Medical History  Diagnosis Date  . Essential hypertension, benign   . Chronic bronchitis   . GERD (gastroesophageal reflux disease)   . PUD (peptic ulcer disease)   . History of lower GI bleeding   . Arthritis   . History of gout   . CAD (coronary artery disease)     a. 12/2011 NSTEMI/Cath/PCI LCX (2.25x14 Resolute DES) & D1 (2.25x22 Resolute DES);  b. 01/2012 Cath/PCI: LM 30, LAD 30p, 40-50m, D1 stent ok, 99 in sm branch of diag, LCX patent stent, OM1 20, RCA 95 ost (4.0x12 Promus DES), EF 55%;  c. 04/2012 Lexi Cardiolite  EF 48%, small area of scar @ base/mid inflat wall with mild peri-infarct ischemia.; CABG 12/4  . High cholesterol 12/2011  . Chronic diastolic CHF (congestive heart failure)     a. 02/2012 Echo EF 60-65%, nl wall motion, Gr 1 DD, mod MR  . Pneumonia ~ 2009  . Iron deficiency anemia   . TIA (transient ischemic attack)   . Anxiety   . History of blood transfusion 07/2011; 12/2011; 01/2012 X 2; 04/2012  . ESRD (end stage renal disease) on dialysis 12/15/11    a. HD @ [facility]; Mon, Wed, Fri  . Carotid artery disease     a.  [id number] LICA, [id number]   . Mitral regurgitation     a. Moderate by echo, 02/2012  . Myocardial infarction   . Ovarian cancer  [place]    Past Surgical History  Procedure Date  . Abdominal hysterectomy 1992  . Appendectomy 06/1990  . Tubal ligation 1980's  . Av fistula placement 07/2009    left upper arm  . Thrombectomy / arteriovenous graft revision 2011    left upper arm  . Colon resection 1992  . Esophagogastroduodenoscopy 01/20/2012    Procedure: ESOPHAGOGASTRODUODENOSCOPY (EGD);  Surgeon: Ladene Artist, MD,FACG;  Location: [facility] ENDOSCOPY;  Service: Endoscopy;  Laterality: N/A;  . Dilation and curettage of uterus   . Coronary angioplasty with stent placement 12/15/11    "2"  . Coronary angioplasty with stent placement y/2013    "1; makes total of 3" (05/02/2012)  . Coronary artery bypass graft 06/13/2012    Procedure: CORONARY ARTERY BYPASS GRAFTING (CABG);  Surgeon: Grace Isaac, MD;  Location: [place];  Service: Open Heart Surgery;  Laterality: N/A;  cabg x four;  using left internal mammary artery, and left leg greater saphenous vein harvested endoscopically  . Intraoperative transesophageal echocardiogram 06/13/2012    Procedure: INTRAOPERATIVE TRANSESOPHAGEAL ECHOCARDIOGRAM;  Surgeon: Grace Isaac, MD;  Location: [place];  Service: Open Heart Surgery;  Laterality: N/A;  . Colectomy 1992    History   Social History  . Marital Status: Married    Spouse Name: N/A    Number of Children: N/A  . Years of Education: N/A   Occupational History  . Not on file.   Social History Main Topics  . Smoking status: Never Smoker   . Smokeless tobacco: Never Used  . Alcohol Use: No  . Drug Use: No  . Sexually Active: Yes   Other Topics Concern  . Not on file   Social History Narrative   Lives in [place], [place] with husband.  Dialysis pt - [facility].   Social History Narrative   Lives in [place], [place] with husband.  Dialysis pt - [facility].    Problem Relation Age of Onset  .  Other      noncontributory for early CAD    ROS: no nausea, vomiting; no fever, chills; no melena, hematochezia; no claudication  PHYSICAL EXAM: BP 151/76  Pulse 74  Ht 5\' 1"  (1.549 m)  Wt 134 lb (60.782 kg)  BMI 25.32 kg/m2 GENERAL: 72 year old female; NAD  HEENT: NCAT, PERRLA, EOMI; sclera clear; no xanthelasma  NECK: no JVD; no TM  LUNGS: Diminished breath sounds, no crackles/wheezes  CARDIAC: RRR (S1, S2); no significant murmurs; no rubs or gallops  ABDOMEN: soft, non-tender; intact BS  EXTREMETIES: no significant peripheral edema  SKIN: warm/dry; no obvious rash/lesions  MUSCULOSKELETAL: Well-healed midline incision  NEURO: no focal deficit; NL affect    EKG: reviewed and available in Electronic Records   ASSESSMENT & PLAN:  Hx of CABG Status post recent 4 vessel CABG, following presentation with NST EMI and findings of 3v CAD with ISR of RCA and CFX, as well as LAD disease progression; EF 40% with global HK, by cardiac catheterization. Patient recently cleared by surgery, and now presents with no current signs/symptoms suggestive of CHF. Patient to remain compliant with HD schedule, and we'll plan on reassessment of clinical status in 3 months, with Dr. Domenic Polite.  Bacteremia Will repeat one set of blood cultures 2 weeks after her last vancomycin dose, as recently recommended by ID team. Suspect that recent positive culture was secondary to contamination.  HLD (hyperlipidemia) Continue current dose Lipitor, with target LDL 70 or less, if feasible. Most recent LDL 59,  this past December.    Gene Courtnie Brenes, PAC

## 2012-07-30 ENCOUNTER — Other Ambulatory Visit: Payer: Self-pay | Admitting: Physician Assistant

## 2012-08-05 ENCOUNTER — Encounter (HOSPITAL_COMMUNITY): Payer: Self-pay | Admitting: *Deleted

## 2012-08-05 ENCOUNTER — Emergency Department (HOSPITAL_COMMUNITY)
Admission: EM | Admit: 2012-08-05 | Discharge: 2012-08-06 | Disposition: A | Discharge status: Home / Self Care | Payer: Medicare Other | Attending: Emergency Medicine | Admitting: Emergency Medicine

## 2012-08-05 ENCOUNTER — Emergency Department (HOSPITAL_COMMUNITY): Payer: Medicare Other

## 2012-08-05 DIAGNOSIS — N186 End stage renal disease: Secondary | ICD-10-CM | POA: Insufficient documentation

## 2012-08-05 DIAGNOSIS — R0602 Shortness of breath: Secondary | ICD-10-CM | POA: Insufficient documentation

## 2012-08-05 DIAGNOSIS — R0789 Other chest pain: Secondary | ICD-10-CM

## 2012-08-05 DIAGNOSIS — Z992 Dependence on renal dialysis: Secondary | ICD-10-CM | POA: Insufficient documentation

## 2012-08-05 DIAGNOSIS — I120 Hypertensive chronic kidney disease with stage 5 chronic kidney disease or end stage renal disease: Secondary | ICD-10-CM | POA: Insufficient documentation

## 2012-08-05 DIAGNOSIS — I252 Old myocardial infarction: Secondary | ICD-10-CM | POA: Insufficient documentation

## 2012-08-05 DIAGNOSIS — J42 Unspecified chronic bronchitis: Secondary | ICD-10-CM | POA: Insufficient documentation

## 2012-08-05 DIAGNOSIS — R071 Chest pain on breathing: Secondary | ICD-10-CM | POA: Insufficient documentation

## 2012-08-05 DIAGNOSIS — Z8739 Personal history of other diseases of the musculoskeletal system and connective tissue: Secondary | ICD-10-CM | POA: Insufficient documentation

## 2012-08-05 DIAGNOSIS — I509 Heart failure, unspecified: Secondary | ICD-10-CM | POA: Insufficient documentation

## 2012-08-05 DIAGNOSIS — Z8701 Personal history of pneumonia (recurrent): Secondary | ICD-10-CM | POA: Insufficient documentation

## 2012-08-05 DIAGNOSIS — Z8679 Personal history of other diseases of the circulatory system: Secondary | ICD-10-CM | POA: Insufficient documentation

## 2012-08-05 DIAGNOSIS — K219 Gastro-esophageal reflux disease without esophagitis: Secondary | ICD-10-CM | POA: Insufficient documentation

## 2012-08-05 DIAGNOSIS — Z951 Presence of aortocoronary bypass graft: Secondary | ICD-10-CM | POA: Insufficient documentation

## 2012-08-05 DIAGNOSIS — Z8639 Personal history of other endocrine, nutritional and metabolic disease: Secondary | ICD-10-CM | POA: Insufficient documentation

## 2012-08-05 DIAGNOSIS — Z8711 Personal history of peptic ulcer disease: Secondary | ICD-10-CM | POA: Insufficient documentation

## 2012-08-05 DIAGNOSIS — Z8659 Personal history of other mental and behavioral disorders: Secondary | ICD-10-CM | POA: Insufficient documentation

## 2012-08-05 DIAGNOSIS — Z7982 Long term (current) use of aspirin: Secondary | ICD-10-CM | POA: Insufficient documentation

## 2012-08-05 DIAGNOSIS — Z862 Personal history of diseases of the blood and blood-forming organs and certain disorders involving the immune mechanism: Secondary | ICD-10-CM | POA: Insufficient documentation

## 2012-08-05 DIAGNOSIS — I2510 Atherosclerotic heart disease of native coronary artery without angina pectoris: Secondary | ICD-10-CM | POA: Insufficient documentation

## 2012-08-05 DIAGNOSIS — Z9861 Coronary angioplasty status: Secondary | ICD-10-CM | POA: Insufficient documentation

## 2012-08-05 DIAGNOSIS — IMO0002 Reserved for concepts with insufficient information to code with codable children: Secondary | ICD-10-CM | POA: Insufficient documentation

## 2012-08-05 DIAGNOSIS — Z8543 Personal history of malignant neoplasm of ovary: Secondary | ICD-10-CM | POA: Insufficient documentation

## 2012-08-05 DIAGNOSIS — E7800 Pure hypercholesterolemia, unspecified: Secondary | ICD-10-CM | POA: Insufficient documentation

## 2012-08-05 DIAGNOSIS — Z8673 Personal history of transient ischemic attack (TIA), and cerebral infarction without residual deficits: Secondary | ICD-10-CM | POA: Insufficient documentation

## 2012-08-05 DIAGNOSIS — Z85038 Personal history of other malignant neoplasm of large intestine: Secondary | ICD-10-CM | POA: Insufficient documentation

## 2012-08-05 DIAGNOSIS — Z8719 Personal history of other diseases of the digestive system: Secondary | ICD-10-CM | POA: Insufficient documentation

## 2012-08-05 LAB — POCT I-STAT, CHEM 8
HCT: 35 % — ABNORMAL LOW (ref 36.0–46.0)
Hemoglobin: 11.9 g/dL — ABNORMAL LOW (ref 12.0–15.0)
Potassium: 5.3 mEq/L — ABNORMAL HIGH (ref 3.5–5.1)
Sodium: 137 mEq/L (ref 135–145)

## 2012-08-05 LAB — CBC WITH DIFFERENTIAL/PLATELET
Basophils Absolute: 0.1 10*3/uL (ref 0.0–0.1)
HCT: 34.1 % — ABNORMAL LOW (ref 36.0–46.0)
Lymphocytes Relative: 16 % (ref 12–46)
Monocytes Relative: 7 % (ref 3–12)
Neutro Abs: 5.7 10*3/uL (ref 1.7–7.7)
Platelets: 317 10*3/uL (ref 150–400)
RDW: 21.4 % — ABNORMAL HIGH (ref 11.5–15.5)
WBC: 8.9 10*3/uL (ref 4.0–10.5)

## 2012-08-05 LAB — POCT I-STAT TROPONIN I

## 2012-08-05 IMAGING — CR DG CHEST 1V PORT
1 series · 1 of 1 positions shown · non-contrast
Comparison: PA and lateral chest [DATE] and [DATE].

CLINICAL DATA: Chest pain.

PORTABLE CHEST - 1 VIEW

[AP]
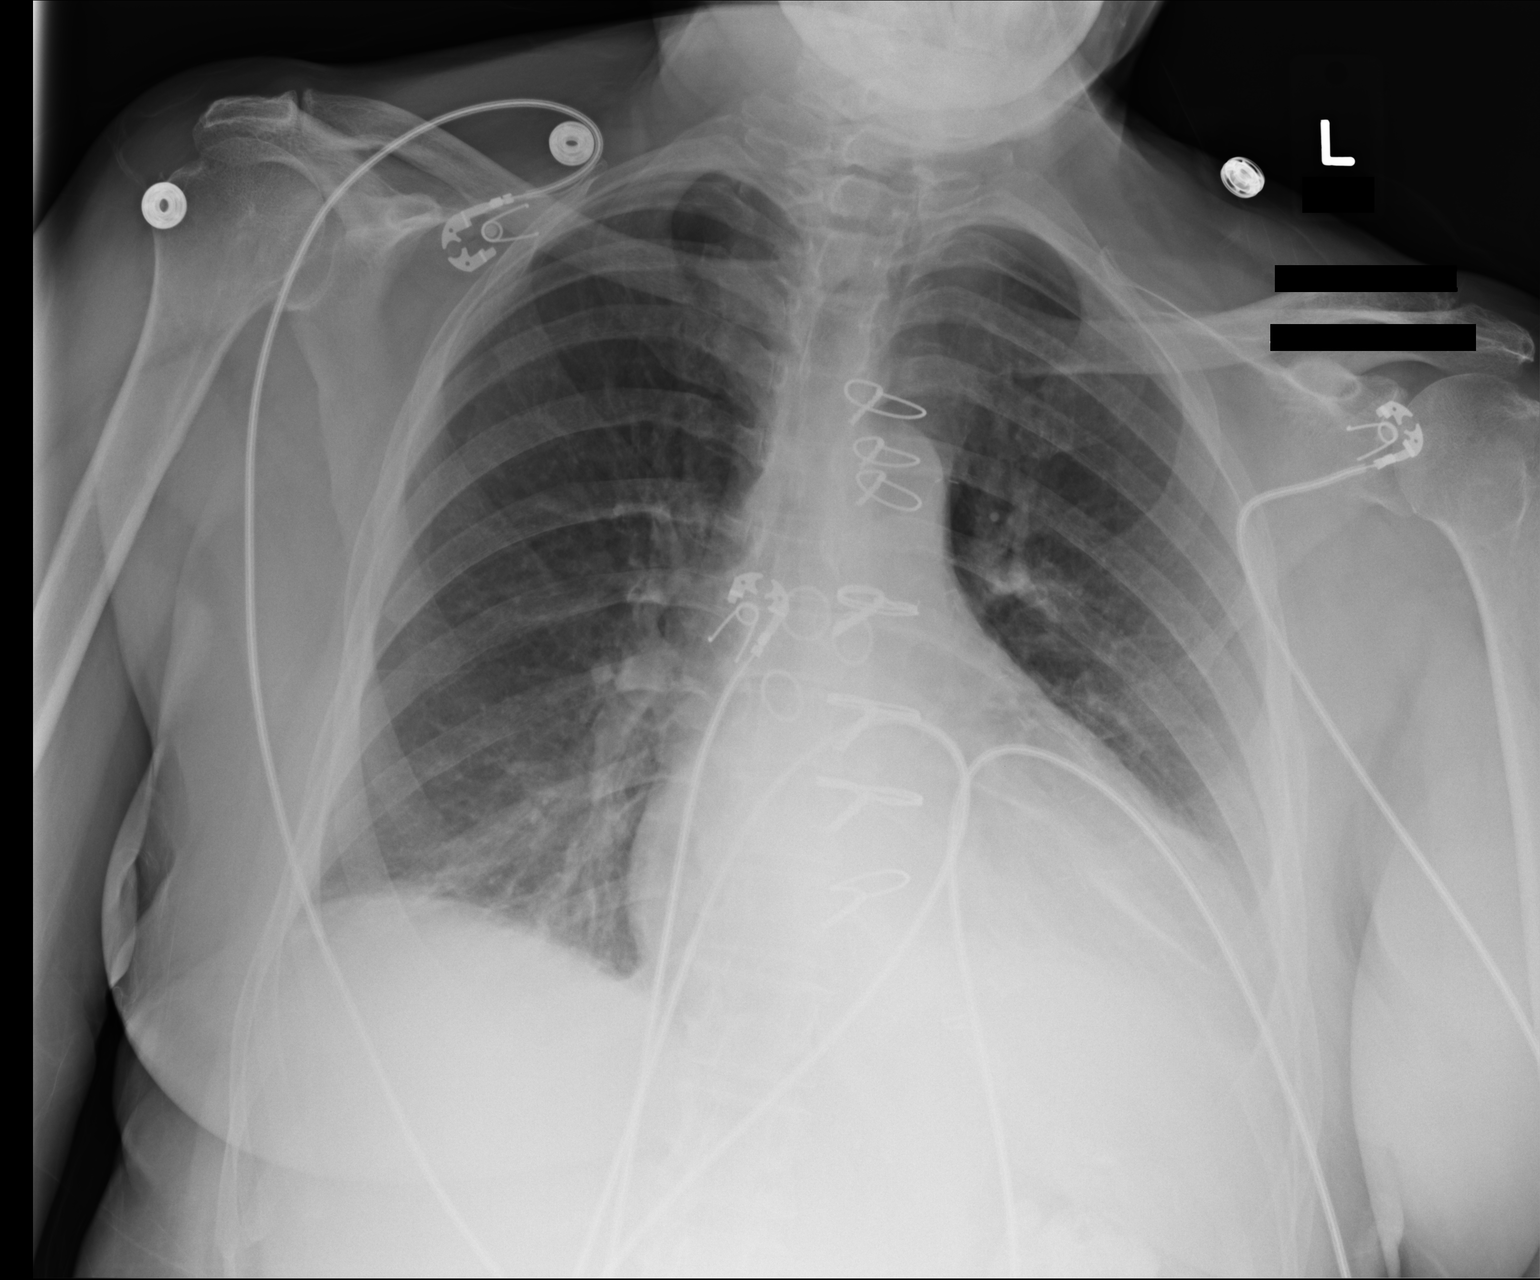

[1 of 1 positions shown; findings below may reference images not displayed]

FINDINGS: Small left pleural effusion and basilar airspace disease
have worsened.  There is some subsegmental atelectasis in the right
lung base.  Cardiomegaly is noted.  No pneumothorax.
IMPRESSION: 1.  Increased left pleural effusion and basilar airspace disease,
likely atelectasis.
2.  Cardiomegaly without edema.

## 2012-08-05 MED ORDER — ASPIRIN 81 MG PO CHEW
324.0000 mg | CHEWABLE_TABLET | Freq: Once | ORAL | Status: AC
  Administered 2012-08-05: 324 mg via ORAL
  Filled 2012-08-05: qty 4

## 2012-08-05 MED ORDER — MORPHINE SULFATE 4 MG/ML IJ SOLN
2.0000 mg | Freq: Once | INTRAMUSCULAR | Status: AC
  Administered 2012-08-06: 2 mg via INTRAVENOUS
  Filled 2012-08-05: qty 1

## 2012-08-05 NOTE — ED Notes (Addendum)
CABG in December. C/o CP, onset ~ 1 week ago, also some sob, pain comes and goes and moves, L & R. Mentions falling Thursday. Alert, NAD, calm, interactive. Aspirin listed as allergy, but she takes aspirin 81mg  daily.

## 2012-08-05 NOTE — ED Provider Notes (Signed)
History     CSN: [id number]  Arrival date & time 08/05/12  2228   First MD Initiated Contact with Patient 08/05/12 2257      Chief Complaint  Patient presents with  . Chest Pain    (Consider location/radiation/quality/duration/timing/severity/associated sxs/prior treatment) HPI History provided by pt.   Pt has had non-radiating pain right anterior chest for the past week.  Usually exertional but has occurred at rest as well.  Aggravated by movements, particularly reaching to pick something up, as well as deep inspiration.  This afternoon, while laying down to take a nap, the pain acutely worsened and was for the first time, associated w/ SOB.  Denies fever, cough, abd pain, nausea.  Has had exertional SOB today as well. Denies trauma.  Per prior chart, pt had an NSTEMI in 01/2012, had PCI w/ placement of 2 stents, and then had a CABG in 06/2012.  Was admitted 1 month ago for chest wall pain secondary to recent CABG.  Pt reports that her current pain does not feel like past MI.   Past Medical History  Diagnosis Date  . Essential hypertension, benign   . Chronic bronchitis   . GERD (gastroesophageal reflux disease)   . PUD (peptic ulcer disease)   . History of lower GI bleeding   . Arthritis   . History of gout   . CAD (coronary artery disease)     a. 12/2011 NSTEMI/Cath/PCI LCX (2.25x14 Resolute DES) & D1 (2.25x22 Resolute DES);  b. 01/2012 Cath/PCI: LM 30, LAD 30p, 40-64m, D1 stent ok, 99 in sm branch of diag, LCX patent stent, OM1 20, RCA 95 ost (4.0x12 Promus DES), EF 55%;  c. 04/2012 Lexi Cardiolite  EF 48%, small area of scar @ base/mid inflat wall with mild peri-infarct ischemia.; CABG 12/4  . High cholesterol 12/2011  . Chronic diastolic CHF (congestive heart failure)     a. 02/2012 Echo EF 60-65%, nl wall motion, Gr 1 DD, mod MR  . Pneumonia ~ 2009  . Iron deficiency anemia   . TIA (transient ischemic attack)   . Anxiety   . History of blood transfusion 07/2011; 12/2011; 01/2012 X  2; 04/2012  . ESRD (end stage renal disease) on dialysis 12/15/11    a. HD @ [facility]; Mon, Wed, Fri  . Carotid artery disease     a. A999333 LICA, [id number]   . Mitral regurgitation     a. Moderate by echo, 02/2012  . Myocardial infarction   . Ovarian cancer 1992  . Colon cancer 1992    Past Surgical History  Procedure Date  . Abdominal hysterectomy 1992  . Appendectomy 06/1990  . Tubal ligation 1980's  . Av fistula placement 07/2009    left upper arm  . Thrombectomy / arteriovenous graft revision 2011    left upper arm  . Colon resection 1992  . Esophagogastroduodenoscopy 01/20/2012    Procedure: ESOPHAGOGASTRODUODENOSCOPY (EGD);  Surgeon: Ladene Artist, MD,FACG;  Location: [facility] ENDOSCOPY;  Service: Endoscopy;  Laterality: N/A;  . Dilation and curettage of uterus   . Coronary angioplasty with stent placement 12/15/11    "2"  . Coronary angioplasty with stent placement y/2013    "1; makes total of 3" (05/02/2012)  . Coronary artery bypass graft 06/13/2012    Procedure: CORONARY ARTERY BYPASS GRAFTING (CABG);  Surgeon: Grace Isaac, MD;  Location: [place];  Service: Open Heart Surgery;  Laterality: N/A;  cabg x four;  using left internal mammary artery, and left leg  greater saphenous vein harvested endoscopically  . Intraoperative transesophageal echocardiogram 06/13/2012    Procedure: INTRAOPERATIVE TRANSESOPHAGEAL ECHOCARDIOGRAM;  Surgeon: Grace Isaac, MD;  Location: [place];  Service: Open Heart Surgery;  Laterality: N/A;  . Colectomy 1992    Family History  Problem Relation Age of Onset  . Other      noncontributory for early CAD    History  Substance Use Topics  . Smoking status: Never Smoker   . Smokeless tobacco: Never Used  . Alcohol Use: No    OB History    Grav Para Term Preterm Abortions TAB SAB Ect Mult Living                  Review of Systems  All other systems reviewed and are negative.    Allergies  Aspirin; Contrast media; Iron; Macrodantin;  Penicillins; Plavix; Bactrim; Sulfa antibiotics; and Venofer  Home Medications   Current Outpatient Rx  Name  Route  Sig  Dispense  Refill  . ALPRAZOLAM 0.5 MG PO TABS   Oral   Take 0.5 mg by mouth at bedtime as needed. For sleep         . AMLODIPINE BESYLATE 10 MG PO TABS   Oral   Take 10 mg by mouth daily.         . ASPIRIN 81 MG PO CHEW   Oral   Chew 81 mg by mouth daily.         . ATORVASTATIN CALCIUM 20 MG PO TABS   Oral   Take 20 mg by mouth daily.         Marland Kitchen CLONIDINE HCL 0.3 MG/24HR TD PTWK   Transdermal   Place 1 patch onto the skin once a week. Saturdays         . FLUTICASONE PROPIONATE 50 MCG/ACT NA SUSP   Nasal   Place 2 sprays into the nose daily.         Marland Kitchen FOLIC ACID 1 MG PO TABS   Oral   Take 1 tablet (1 mg total) by mouth daily.   30 tablet   6   . DIALYVITE 3000 3 MG PO TABS   Oral   Take 1 tablet by mouth daily.         Marland Kitchen HYDROCORTISONE 1 % EX CREA   Topical   Apply 1 application topically 2 (two) times daily as needed.         Marland Kitchen LANTHANUM CARBONATE 1000 MG PO CHEW   Oral   Chew 1,000 mg by mouth 3 (three) times daily after meals.         Marland Kitchen LIDOCAINE-PRILOCAINE 2.5-2.5 % EX CREA   Topical   Apply 1 application topically as needed. For pain         . LORATADINE 10 MG PO TABS   Oral   Take 10 mg by mouth daily. For allergies.         Marland Kitchen LOSARTAN POTASSIUM 100 MG PO TABS   Oral   Take 1 tablet (100 mg total) by mouth daily.   30 tablet   6   . PANTOPRAZOLE SODIUM 20 MG PO TBEC   Oral   Take 20 mg by mouth 2 (two) times daily.         Marland Kitchen TICAGRELOR 90 MG PO TABS   Oral   Take 90 mg by mouth 2 (two) times daily.         . TRAMADOL HCL 50 MG PO TABS  Oral   Take 50 mg by mouth every 6 (six) hours as needed. For pain         . VITAMIN C 500 MG PO TABS   Oral   Take 500 mg by mouth daily.            BP 150/71  Temp 97.9 F (36.6 C) (Oral)  Resp 24  SpO2 97%  Physical Exam  Nursing note and  vitals reviewed. Constitutional: She is oriented to person, place, and time. She appears well-developed and well-nourished. No distress.  HENT:  Head: Normocephalic and atraumatic.  Eyes:       Normal appearance  Neck: Normal range of motion.  Cardiovascular: Normal rate, regular rhythm and intact distal pulses.   Pulmonary/Chest: Effort normal and breath sounds normal. No respiratory distress. She exhibits no tenderness.       Well-healed vertical surgical scar over sternum.  Tenderness over sternum and right anterior chest.  Pleuritic pain reported.  Pain also reproducible w/ ROM of RUE and position changes in bed.   Abdominal: Soft. Bowel sounds are normal. She exhibits no distension. There is no tenderness. There is no guarding.  Musculoskeletal: Normal range of motion.       No peripheral edema or calf tenderness  Neurological: She is alert and oriented to person, place, and time.  Skin: Skin is warm and dry. No rash noted.  Psychiatric: She has a normal mood and affect. Her behavior is normal.    ED Course  Procedures (including critical care time)   Date: 08/06/2012  Rate: 78  Rhythm: normal sinus rhythm  QRS Axis: normal  Intervals: normal  ST/T Wave abnormalities: normal  Conduction Disutrbances:none  Narrative Interpretation:  LVH  Old EKG Reviewed: unchanged   Labs Reviewed  CBC WITH DIFFERENTIAL - Abnormal; Notable for the following:    RBC 3.54 (*)     Hemoglobin 10.8 (*)     HCT 34.1 (*)     RDW 21.4 (*)     Eosinophils Relative 12 (*)     Eosinophils Absolute 1.1 (*)     All other components within normal limits  POCT I-STAT, CHEM 8 - Abnormal; Notable for the following:    Potassium 5.3 (*)     BUN 64 (*)     Creatinine, Ser 10.60 (*)     Glucose, Bld 104 (*)     Hemoglobin 11.9 (*)     HCT 35.0 (*)     All other components within normal limits  POCT I-STAT TROPONIN I   Dg Chest Portable 1 View  08/05/2012  *RADIOLOGY REPORT*  Clinical Data: Chest  pain.  PORTABLE CHEST - 1 VIEW  Comparison: PA and lateral chest 07/19/2012 and 07/08/2012.  Findings: Small left pleural effusion and basilar airspace disease have worsened.  There is some subsegmental atelectasis in the right lung base.  Cardiomegaly is noted.  No pneumothorax.  IMPRESSION:  1.  Increased left pleural effusion and basilar airspace disease, likely atelectasis. 2.  Cardiomegaly without edema.   Original Report Authenticated By: Orlean Patten, M.D.      1. Chest wall pain       MDM  72yo F w/ h/o CABG in 06/2012 and admission later that month for chest wall pain, presents w/ non-traumatic R anterior CP and exertional SOB.  Pain feels different than past MI and chronic intermittent pain she has been experiencing since procedure.  Dull at rest and aggravated by movement.  Reproducible on exam  w/ palpation and ROM of RUE.  EKG non-ischemic and unchanged from prior, CXR stable and labs unremarkable.  [facility], fellow examined her and agrees that pain is unlikely to be cardiac.  Pt d/c'd home.  I recommended rest, ice, tylenol and close f/u with her cardiologist.  Return precautions discussed. 1:33 AM         Remer Macho, PA-C 08/06/12 [phone]

## 2012-08-06 ENCOUNTER — Telehealth: Payer: Self-pay | Admitting: Physician Assistant

## 2012-08-06 DIAGNOSIS — R079 Chest pain, unspecified: Secondary | ICD-10-CM

## 2012-08-06 NOTE — Progress Notes (Signed)
Patient ID: Shawna [name], female   DOB: 02/14/1941, 72 y.o.   MRN: [id number]  CARDIOLOGY CONSULT NOTE  Patient ID: Shawna [name]  MRN: [id number]  DOB: 1940/12/25  Admit date: 08/05/2012 Date of Consult: 08/06/2012  Primary Physician: [name], MD Primary Cardiologist: Jenell Milliner  Reason for Consultation: Chest Pain History of Present Illness: Shawna [name] is a 72 y.o. female seen in the ED for chest wall pain.  She is s/p CABG 12/4, and has had struggled with chest wall pain since at the site of her sternotomy.  A couple of days ago she had a mechanical fall, caught herself by her hands.  Since then, and particularly today has noted increased pain at her sternonotomy site, exacerbated by inspiration.  Presents to ED for further eval.  Past Medical History  Diagnosis Date  . Essential hypertension, benign   . Chronic bronchitis   . GERD (gastroesophageal reflux disease)   . PUD (peptic ulcer disease)   . History of lower GI bleeding   . Arthritis   . History of gout   . CAD (coronary artery disease)     a. 12/2011 NSTEMI/Cath/PCI LCX (2.25x14 Resolute DES) & D1 (2.25x22 Resolute DES);  b. 01/2012 Cath/PCI: LM 30, LAD 30p, 40-63m, D1 stent ok, 99 in sm branch of diag, LCX patent stent, OM1 20, RCA 95 ost (4.0x12 Promus DES), EF 55%;  c. 04/2012 Lexi Cardiolite  EF 48%, small area of scar @ base/mid inflat wall with mild peri-infarct ischemia.; CABG 12/4  . High cholesterol 12/2011  . Chronic diastolic CHF (congestive heart failure)     a. 02/2012 Echo EF 60-65%, nl wall motion, Gr 1 DD, mod MR  . Pneumonia ~ 2009  . Iron deficiency anemia   . TIA (transient ischemic attack)   . Anxiety   . History of blood transfusion 07/2011; 12/2011; 01/2012 X 2; 04/2012  . ESRD (end stage renal disease) on dialysis 12/15/11    a. HD @ [facility]; Mon, Wed, Fri  . Carotid artery disease     a. [id number] LICA, [id number]   . Mitral regurgitation     a. Moderate by echo, 02/2012  . Myocardial infarction   .  Ovarian cancer 1992  . Colon cancer 1992    Past Surgical History  Procedure Date  . Abdominal hysterectomy 1992  . Appendectomy 06/1990  . Tubal ligation 1980's  . Av fistula placement 07/2009    left upper arm  . Thrombectomy / arteriovenous graft revision 2011    left upper arm  . Colon resection 1992  . Esophagogastroduodenoscopy 01/20/2012    Procedure: ESOPHAGOGASTRODUODENOSCOPY (EGD);  Surgeon: Ladene Artist, MD,FACG;  [facility];  Service: Endoscopy;  Laterality: N/A;  . Dilation and curettage of uterus   . Coronary angioplasty with stent placement 12/15/11    "2"  . Coronary angioplasty with stent placement y/2013    "1; makes total of 3" (05/02/2012)  . Coronary artery bypass graft 06/13/2012    Procedure: CORONARY ARTERY BYPASS GRAFTING (CABG);  Surgeon: Grace Isaac, MD;  Location: [place];  Service: Open Heart Surgery;  Laterality: N/A;  cabg x four;  using left internal mammary artery, and left leg greater saphenous vein harvested endoscopically  . Intraoperative transesophageal echocardiogram 06/13/2012    Procedure: INTRAOPERATIVE TRANSESOPHAGEAL ECHOCARDIOGRAM;  Surgeon: Grace Isaac, MD;  Location: [place];  Service: Open Heart Surgery;  Laterality: N/A;  . Colectomy 1992     Home Meds:  Prior to Admission medications   Medication Sig Start Date End Date Taking? Authorizing Provider  ALPRAZolam Duanne Moron) 0.5 MG tablet Take 0.5 mg by mouth at bedtime as needed. For sleep   Yes Historical Provider, MD  amLODipine (NORVASC) 10 MG tablet Take 10 mg by mouth daily.   Yes Historical Provider, MD  aspirin 81 MG chewable tablet Chew 81 mg by mouth daily.   Yes Historical Provider, MD  atorvastatin (LIPITOR) 20 MG tablet Take 20 mg by mouth daily.   Yes Historical Provider, MD  cloNIDine (CATAPRES - DOSED IN MG/24 HR) 0.3 mg/24hr Place 1 patch onto the skin once a week. Saturdays   Yes Historical Provider, MD  fluticasone (FLONASE) 50 MCG/ACT nasal spray Place  2 sprays into the nose daily.   Yes Historical Provider, MD  folic acid (FOLVITE) 1 MG tablet Take 1 tablet (1 mg total) by mouth daily. 07/26/12  Yes Aurora Mask, PA  folic acid-vitamin b complex-vitamin c-selenium-zinc (DIALYVITE) 3 MG TABS Take 1 tablet by mouth daily.   Yes Historical Provider, MD  hydrocortisone cream 1 % Apply 1 application topically 2 (two) times daily as needed.   Yes Historical Provider, MD  lanthanum (FOSRENOL) 1000 MG chewable tablet Chew 1,000 mg by mouth 3 (three) times daily after meals.   Yes Historical Provider, MD  lidocaine-prilocaine (EMLA) cream Apply 1 application topically as needed. For pain   Yes Historical Provider, MD  loratadine (CLARITIN) 10 MG tablet Take 10 mg by mouth daily. For allergies.   Yes Historical Provider, MD  losartan (COZAAR) 100 MG tablet Take 1 tablet (100 mg total) by mouth daily. 07/26/12  Yes Aurora Mask, PA  pantoprazole (PROTONIX) 20 MG tablet Take 20 mg by mouth 2 (two) times daily.   Yes Historical Provider, MD  Ticagrelor (BRILINTA) 90 MG TABS tablet Take 90 mg by mouth 2 (two) times daily.   Yes Historical Provider, MD  traMADol (ULTRAM) 50 MG tablet Take 50 mg by mouth every 6 (six) hours as needed. For pain 07/12/12  Yes Jessica A Hope, PA-C  vitamin C (ASCORBIC ACID) 500 MG tablet Take 500 mg by mouth daily.  07/11/12 08/10/12 Yes Historical Provider, MD    Current Medications:      Allergies:    Allergies  Allergen Reactions  . Aspirin Other (See Comments)    Mess up her stomach; "makes my bowels have blood in them". Takes 81 mg EC Aspirin   . Contrast Media (Iodinated Diagnostic Agents) Itching  . Iron Itching and Other (See Comments)    "they gave me iron in dialysis; had to give me Benadryl cause I had to have the iron" (05/02/2012)  . Macrodantin (Nitrofurantoin Macrocrystal) Other (See Comments)    "broke me out in big old knots all over my body; had to go to ER"  . Penicillins Other (See Comments)    "makes me  real weak when I take it; like I'll pass out"  . Plavix (Clopidogrel Bisulfate) Rash  . Bactrim (Sulfamethoxazole W-Trimethoprim) Rash  . Sulfa Antibiotics Rash  . Venofer (Ferric Oxide) Itching    Patient reports using Benadryl prior to doses as [place] History:   The patient  reports that she has never smoked. She has never used smokeless tobacco. She reports that she does not drink alcohol or use illicit drugs.    Family History:   The patient's family history includes Other in an unspecified family member.   ROS:  Please see the history of present illness.    All other systems reviewed and negative.   Vital Signs: Blood pressure 150/71, pulse 71, temperature 97.9 F (36.6 C), temperature source Oral, resp. rate 18, SpO2 98.00%.   PHYSICAL EXAM: General:  Well nourished, well developed, in no acute distress HEENT: normal Lymph: no adenopathy Neck: no JVD Endocrine:  No thryomegaly Vascular: No carotid bruits; FA pulses 2+ bilaterally without bruits Cardiac:  normal S1, S2; RRR; no murmur.  Pain is reproducible on exam. Lungs:  clear to auscultation bilaterally, no wheezing, rhonchi or rales Abd: soft, nontender, no hepatomegaly Ext: no edema Musculoskeletal:  No deformities, BUE and BLE strength normal and equal Skin: warm and dry Neuro:  CNs 2-12 intact, no focal abnormalities noted Psych:  Normal affect   EKG:  Unchanged from prior  Labs: No results found for this basename: CKTOTAL:4,CKMB:4,TROPONINI:4 in the last 72 hours Lab Results  Component Value Date   WBC 8.9 08/05/2012   HGB 11.9* 08/05/2012   HCT 35.0* 08/05/2012   MCV 96.3 08/05/2012   PLT 317 08/05/2012    Lab 08/05/12 2333  NA 137  K 5.3*  CL 101  CO2 --  BUN 64*  CREATININE 10.60*  CALCIUM --  PROT --  BILITOT --  ALKPHOS --  ALT --  AST --  GLUCOSE 104*   Lab Results  Component Value Date   CHOL 129 07/07/2012   HDL 51 07/07/2012   LDLCALC 59 07/07/2012   TRIG 97  07/07/2012   Lab Results  Component Value Date   DDIMER 1.97* 07/03/2012    Radiology/Studies:  Dg Chest 2 View  07/19/2012  *RADIOLOGY REPORT*  Clinical Data: CABG, follow-up, dialysis patient  CHEST - 2 VIEW  Comparison: Chest x-ray of 07/08/2012  Findings: Aeration of the left lung base has improved somewhat, but there is linear atelectasis remaining at the left base.  No definite effusion is seen although a tiny left effusion is difficult to exclude.  Cardiomegaly is stable.  Median sternotomy sutures are noted  IMPRESSION: Slightly improved aeration with persistent left basilar linear atelectasis and possible small left effusion.   Original Report Authenticated By: Ivar Drape, M.D.    Dg Chest 2 View  07/08/2012  *RADIOLOGY REPORT*  Clinical Data: Cough.  Fever and chest pain.  CHEST - 2 VIEW  Comparison: 07/04/2012  Findings: Status post median sternotomy and CABG procedure. Small pleural  effusions appears similar to previous exam.  Bibasilar atelectasis noted in the lung bases.  There are no airspace consolidation identified.  IMPRESSION: Persistent small pleural effusions and bibasilar atelectasis.   Original Report Authenticated By: Kerby Moors, M.D.    Dg Chest Portable 1 View  08/05/2012  *RADIOLOGY REPORT*  Clinical Data: Chest pain.  PORTABLE CHEST - 1 VIEW  Comparison: PA and lateral chest 07/19/2012 and 07/08/2012.  Findings: Small left pleural effusion and basilar airspace disease have worsened.  There is some subsegmental atelectasis in the right lung base.  Cardiomegaly is noted.  No pneumothorax.  IMPRESSION:  1.  Increased left pleural effusion and basilar airspace disease, likely atelectasis. 2.  Cardiomegaly without edema.   Original Report Authenticated By: Orlean Patten, M.D.     ASSESSMENT AND PLAN:  1. Chest wall discomfort s/p sternotomy.  CXR shows sternotomy sutures intact.  EKG is unchanged from baseline with negative markers.  Patient should follow up with  primary cardiologist over the phone later in the week.  Would recommend she take tylenol  standing (500mg  q8hr) until pain improves.   Cleotis Lema 08/06/2012 1:26 AM

## 2012-08-06 NOTE — Telephone Encounter (Signed)
There was a request on our cross-coverage book from the overnight fellow requesting that Ms. Blackwood receive a follow up phone call to see how she is doing. She was seen in ED in the early morning hours of 1/27 (note signed at 1:27am). Per his consult note, "Patient should follow up with primary cardiologist over the phone later in the week." I left a message on our after-hours line to request that she be called to see how she is doing and will also be forwarding this message to triage pool to have nursing call her.   Tyheim Vanalstyne PA-C

## 2012-08-06 NOTE — ED Provider Notes (Signed)
Medical screening examination/treatment/procedure(s) were performed by non-physician practitioner and as supervising physician I was immediately available for consultation/collaboration.  Rhunette Croft, M.D.     Rhunette Croft, MD 08/06/12 [phone]

## 2012-09-05 ENCOUNTER — Observation Stay (HOSPITAL_COMMUNITY)
Admission: EM | Admit: 2012-09-05 | Discharge: 2012-09-06 | Disposition: A | Discharge status: Home / Self Care | Payer: Medicare Other | Attending: Internal Medicine | Admitting: Internal Medicine

## 2012-09-05 ENCOUNTER — Encounter (HOSPITAL_COMMUNITY): Payer: Self-pay | Admitting: *Deleted

## 2012-09-05 ENCOUNTER — Emergency Department (HOSPITAL_COMMUNITY): Payer: Medicare Other

## 2012-09-05 ENCOUNTER — Telehealth: Payer: Self-pay | Admitting: Physician Assistant

## 2012-09-05 DIAGNOSIS — I5032 Chronic diastolic (congestive) heart failure: Secondary | ICD-10-CM | POA: Insufficient documentation

## 2012-09-05 DIAGNOSIS — I509 Heart failure, unspecified: Secondary | ICD-10-CM | POA: Insufficient documentation

## 2012-09-05 DIAGNOSIS — I252 Old myocardial infarction: Secondary | ICD-10-CM | POA: Insufficient documentation

## 2012-09-05 DIAGNOSIS — I10 Essential (primary) hypertension: Secondary | ICD-10-CM | POA: Diagnosis present

## 2012-09-05 DIAGNOSIS — Z85038 Personal history of other malignant neoplasm of large intestine: Secondary | ICD-10-CM | POA: Insufficient documentation

## 2012-09-05 DIAGNOSIS — Z992 Dependence on renal dialysis: Secondary | ICD-10-CM | POA: Diagnosis present

## 2012-09-05 DIAGNOSIS — Z8673 Personal history of transient ischemic attack (TIA), and cerebral infarction without residual deficits: Secondary | ICD-10-CM | POA: Insufficient documentation

## 2012-09-05 DIAGNOSIS — J42 Unspecified chronic bronchitis: Secondary | ICD-10-CM | POA: Insufficient documentation

## 2012-09-05 DIAGNOSIS — I059 Rheumatic mitral valve disease, unspecified: Secondary | ICD-10-CM | POA: Insufficient documentation

## 2012-09-05 DIAGNOSIS — E7800 Pure hypercholesterolemia, unspecified: Secondary | ICD-10-CM | POA: Insufficient documentation

## 2012-09-05 DIAGNOSIS — Z79899 Other long term (current) drug therapy: Secondary | ICD-10-CM | POA: Insufficient documentation

## 2012-09-05 DIAGNOSIS — I209 Angina pectoris, unspecified: Secondary | ICD-10-CM | POA: Diagnosis present

## 2012-09-05 DIAGNOSIS — Z8711 Personal history of peptic ulcer disease: Secondary | ICD-10-CM | POA: Insufficient documentation

## 2012-09-05 DIAGNOSIS — D631 Anemia in chronic kidney disease: Secondary | ICD-10-CM | POA: Diagnosis present

## 2012-09-05 DIAGNOSIS — Z7982 Long term (current) use of aspirin: Secondary | ICD-10-CM | POA: Insufficient documentation

## 2012-09-05 DIAGNOSIS — R0789 Other chest pain: Secondary | ICD-10-CM

## 2012-09-05 DIAGNOSIS — F411 Generalized anxiety disorder: Secondary | ICD-10-CM | POA: Insufficient documentation

## 2012-09-05 DIAGNOSIS — R079 Chest pain, unspecified: Secondary | ICD-10-CM

## 2012-09-05 DIAGNOSIS — N2581 Secondary hyperparathyroidism of renal origin: Secondary | ICD-10-CM | POA: Diagnosis present

## 2012-09-05 DIAGNOSIS — I2510 Atherosclerotic heart disease of native coronary artery without angina pectoris: Secondary | ICD-10-CM | POA: Diagnosis present

## 2012-09-05 DIAGNOSIS — K219 Gastro-esophageal reflux disease without esophagitis: Secondary | ICD-10-CM | POA: Insufficient documentation

## 2012-09-05 DIAGNOSIS — N186 End stage renal disease: Secondary | ICD-10-CM | POA: Insufficient documentation

## 2012-09-05 DIAGNOSIS — I6529 Occlusion and stenosis of unspecified carotid artery: Secondary | ICD-10-CM | POA: Insufficient documentation

## 2012-09-05 DIAGNOSIS — E785 Hyperlipidemia, unspecified: Secondary | ICD-10-CM | POA: Diagnosis present

## 2012-09-05 DIAGNOSIS — E782 Mixed hyperlipidemia: Secondary | ICD-10-CM | POA: Diagnosis present

## 2012-09-05 DIAGNOSIS — M129 Arthropathy, unspecified: Secondary | ICD-10-CM | POA: Insufficient documentation

## 2012-09-05 DIAGNOSIS — I120 Hypertensive chronic kidney disease with stage 5 chronic kidney disease or end stage renal disease: Principal | ICD-10-CM | POA: Insufficient documentation

## 2012-09-05 DIAGNOSIS — M109 Gout, unspecified: Secondary | ICD-10-CM | POA: Insufficient documentation

## 2012-09-05 DIAGNOSIS — Z8543 Personal history of malignant neoplasm of ovary: Secondary | ICD-10-CM | POA: Insufficient documentation

## 2012-09-05 DIAGNOSIS — Z951 Presence of aortocoronary bypass graft: Secondary | ICD-10-CM

## 2012-09-05 DIAGNOSIS — D509 Iron deficiency anemia, unspecified: Secondary | ICD-10-CM | POA: Insufficient documentation

## 2012-09-05 LAB — COMPREHENSIVE METABOLIC PANEL
AST: 20 U/L (ref 0–37)
Albumin: 3.6 g/dL (ref 3.5–5.2)
Alkaline Phosphatase: 68 U/L (ref 39–117)
Chloride: 100 mEq/L (ref 96–112)
Creatinine, Ser: 3.25 mg/dL — ABNORMAL HIGH (ref 0.50–1.10)
Potassium: 3.7 mEq/L (ref 3.5–5.1)
Total Bilirubin: 0.3 mg/dL (ref 0.3–1.2)

## 2012-09-05 LAB — CBC
Platelets: 268 10*3/uL (ref 150–400)
RDW: 16.9 % — ABNORMAL HIGH (ref 11.5–15.5)
WBC: 7.7 10*3/uL (ref 4.0–10.5)

## 2012-09-05 LAB — POCT I-STAT TROPONIN I

## 2012-09-05 IMAGING — CR DG CHEST 2V
2 series · 2 of 2 positions shown · non-contrast
Comparison: [DATE]

CLINICAL DATA: Hypertension, chest pain.

CHEST - 2 VIEW

[w chest pa]
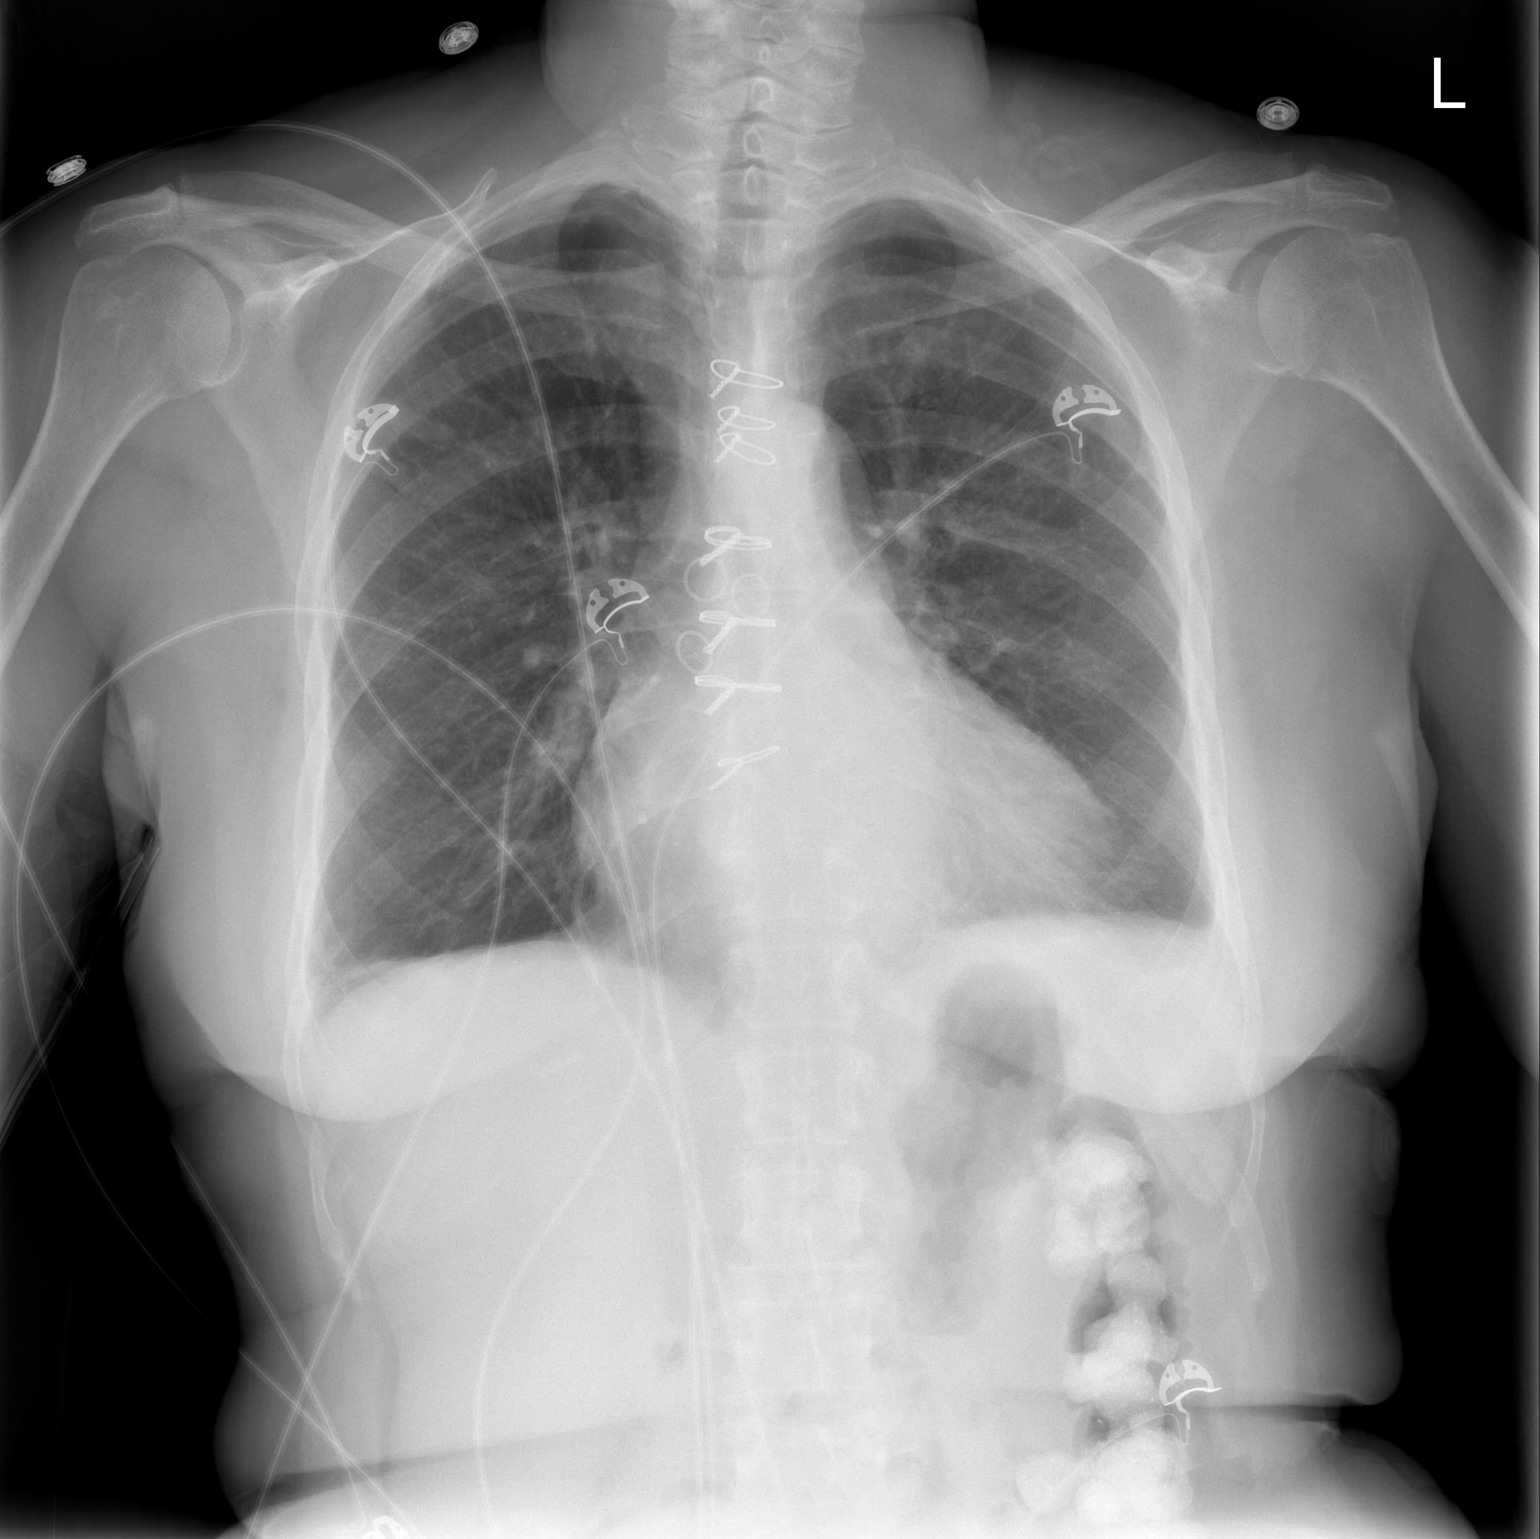

[w chest lat]
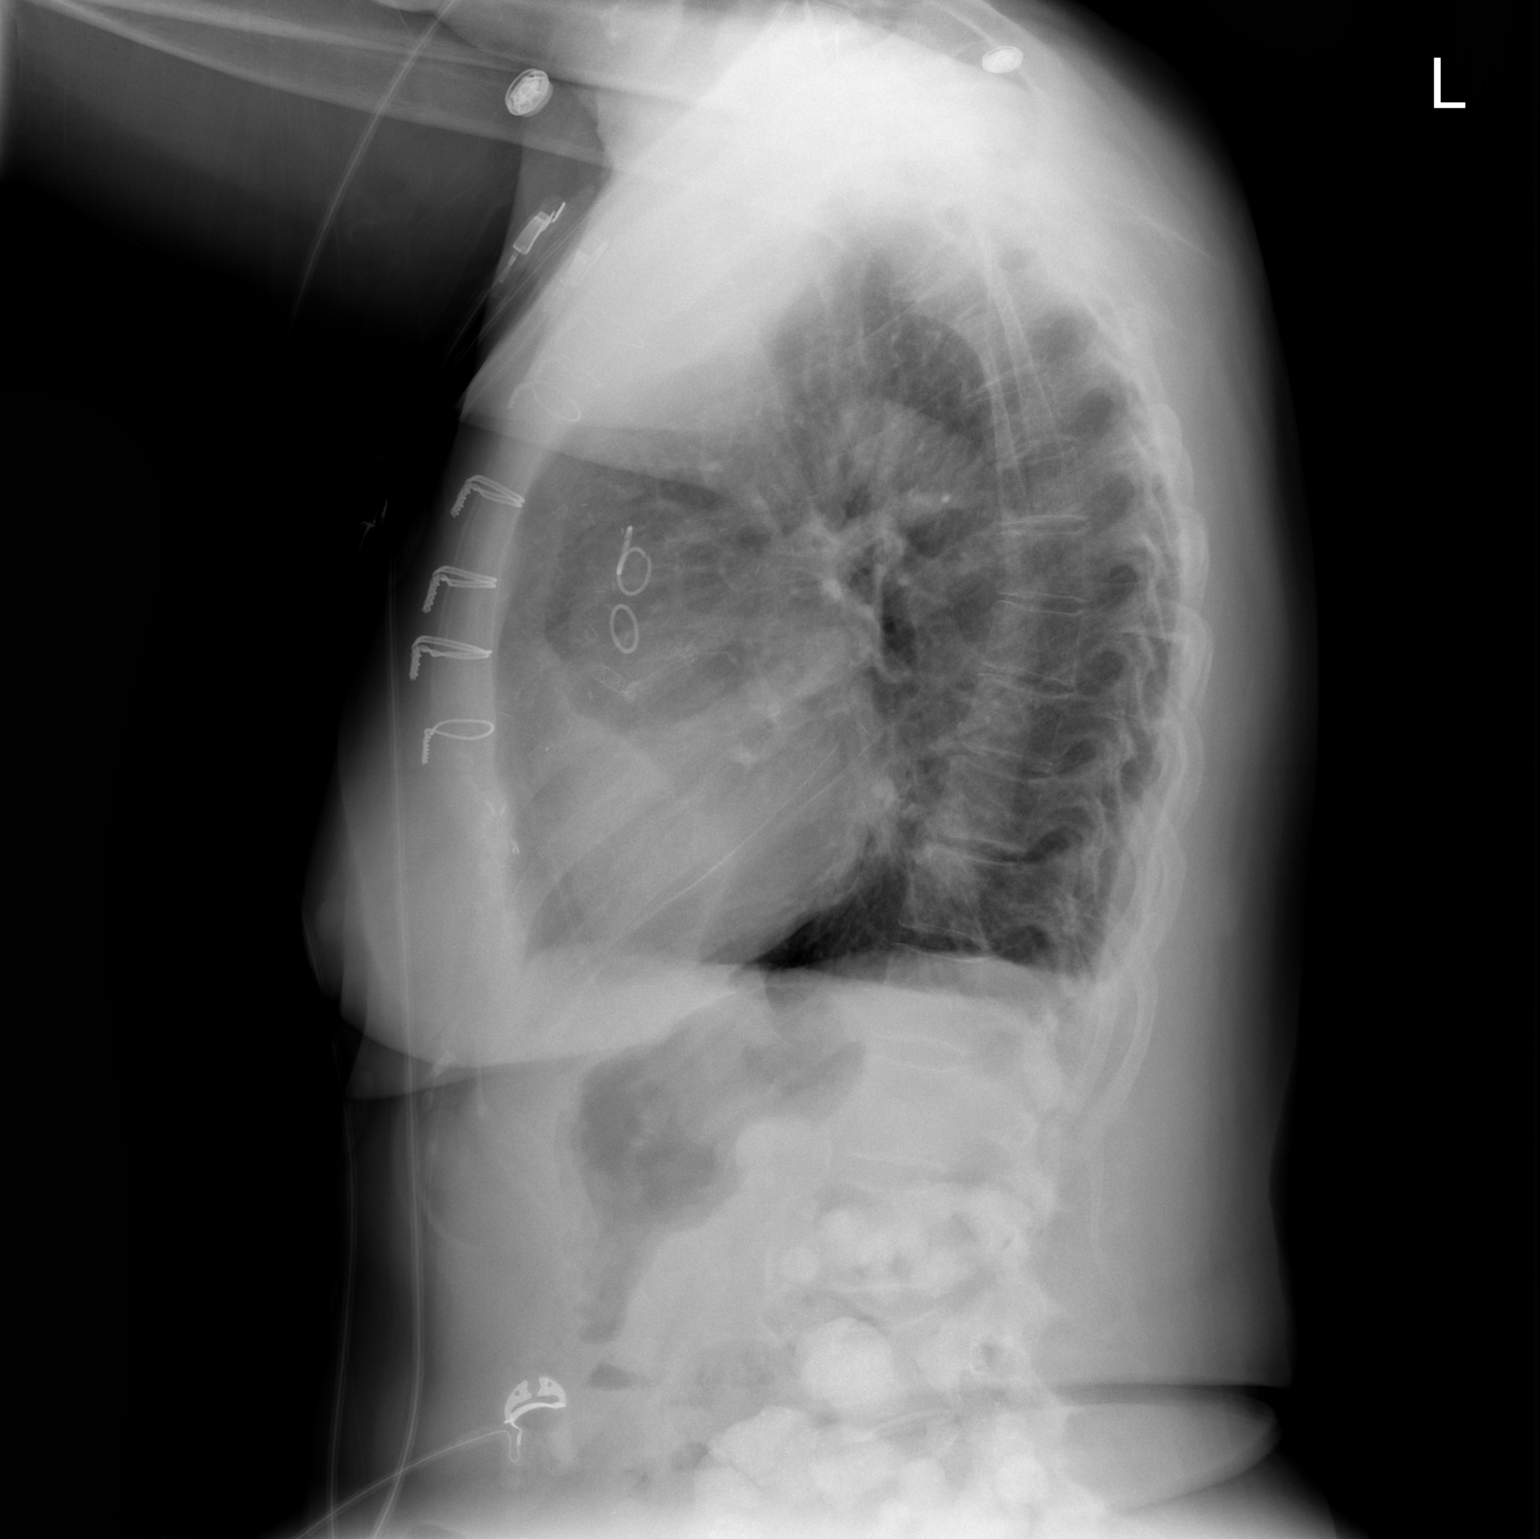

[2 of 2 positions shown; findings below may reference images not displayed]

FINDINGS: Prior CABG.  Heart is borderline in size.  No confluent
airspace opacities.  No effusions.  No acute bony abnormality.
IMPRESSION: No acute cardiopulmonary disease.

## 2012-09-05 MED ORDER — AMLODIPINE BESYLATE 10 MG PO TABS: 10.0000 mg | ORAL_TABLET | Freq: Every day | ORAL | Status: DC

## 2012-09-05 MED ORDER — FOLIC ACID 1 MG PO TABS
1.0000 mg | ORAL_TABLET | Freq: Every day | ORAL | Status: DC
  Administered 2012-09-06: 1 mg via ORAL
  Filled 2012-09-05: qty 1

## 2012-09-05 MED ORDER — LANTHANUM CARBONATE 500 MG PO CHEW
1000.0000 mg | CHEWABLE_TABLET | Freq: Three times a day (TID) | ORAL | Status: DC
  Administered 2012-09-06 (×2): 1000 mg via ORAL
  Filled 2012-09-05 (×5): qty 2

## 2012-09-05 MED ORDER — AMLODIPINE BESYLATE 10 MG PO TABS
10.0000 mg | ORAL_TABLET | Freq: Every day | ORAL | Status: DC
  Administered 2012-09-05 – 2012-09-06 (×2): 10 mg via ORAL
  Filled 2012-09-05 (×2): qty 1

## 2012-09-05 MED ORDER — POLYSACCHARIDE IRON COMPLEX 150 MG PO CAPS
150.0000 mg | ORAL_CAPSULE | Freq: Every day | ORAL | Status: DC
  Administered 2012-09-06: 150 mg via ORAL
  Filled 2012-09-05: qty 1

## 2012-09-05 MED ORDER — PANTOPRAZOLE SODIUM 20 MG PO TBEC
20.0000 mg | DELAYED_RELEASE_TABLET | Freq: Two times a day (BID) | ORAL | Status: DC
  Administered 2012-09-06 (×2): 20 mg via ORAL
  Filled 2012-09-05 (×3): qty 1

## 2012-09-05 MED ORDER — TRAMADOL HCL 50 MG PO TABS
50.0000 mg | ORAL_TABLET | Freq: Four times a day (QID) | ORAL | Status: DC | PRN
  Administered 2012-09-06: 50 mg via ORAL
  Filled 2012-09-05: qty 1

## 2012-09-05 MED ORDER — DIALYVITE 3000 3 MG PO TABS: 1.0000 | ORAL_TABLET | Freq: Every day | ORAL | Status: DC

## 2012-09-05 MED ORDER — SODIUM CHLORIDE 0.9 % IJ SOLN: 3.0000 mL | Freq: Two times a day (BID) | INTRAMUSCULAR | Status: DC

## 2012-09-05 MED ORDER — SODIUM CHLORIDE 0.9 % IJ SOLN
3.0000 mL | Freq: Two times a day (BID) | INTRAMUSCULAR | Status: DC
  Administered 2012-09-06: 3 mL via INTRAVENOUS

## 2012-09-05 MED ORDER — HEPARIN SODIUM (PORCINE) 5000 UNIT/ML IJ SOLN
5000.0000 [IU] | Freq: Three times a day (TID) | INTRAMUSCULAR | Status: DC
  Administered 2012-09-06: 5000 [IU] via SUBCUTANEOUS
  Filled 2012-09-05 (×4): qty 1

## 2012-09-05 MED ORDER — ASPIRIN EC 81 MG PO TBEC
81.0000 mg | DELAYED_RELEASE_TABLET | Freq: Every day | ORAL | Status: DC
  Administered 2012-09-06: 81 mg via ORAL
  Filled 2012-09-05: qty 1

## 2012-09-05 MED ORDER — TICAGRELOR 90 MG PO TABS
90.0000 mg | ORAL_TABLET | Freq: Two times a day (BID) | ORAL | Status: DC
  Administered 2012-09-06 (×2): 90 mg via ORAL
  Filled 2012-09-05 (×3): qty 1

## 2012-09-05 MED ORDER — ALPRAZOLAM 0.5 MG PO TABS
0.5000 mg | ORAL_TABLET | Freq: Every day | ORAL | Status: DC
  Administered 2012-09-06: 0.5 mg via ORAL
  Filled 2012-09-05: qty 1

## 2012-09-05 MED ORDER — LOSARTAN POTASSIUM 50 MG PO TABS
100.0000 mg | ORAL_TABLET | Freq: Every day | ORAL | Status: DC
  Administered 2012-09-06: 100 mg via ORAL
  Filled 2012-09-05: qty 2

## 2012-09-05 MED ORDER — FLUTICASONE PROPIONATE 50 MCG/ACT NA SUSP
2.0000 | Freq: Every day | NASAL | Status: DC
Start: 2012-09-06 — End: 2012-09-06
  Filled 2012-09-05: qty 16

## 2012-09-05 MED ORDER — HYDRALAZINE HCL 20 MG/ML IJ SOLN: 10.0000 mg | Freq: Four times a day (QID) | INTRAMUSCULAR | Status: DC | PRN

## 2012-09-05 MED ORDER — LORATADINE 10 MG PO TABS
10.0000 mg | ORAL_TABLET | Freq: Every day | ORAL | Status: DC
  Administered 2012-09-06: 10 mg via ORAL
  Filled 2012-09-05: qty 1

## 2012-09-05 MED ORDER — NITROGLYCERIN 0.4 MG SL SUBL: 0.4000 mg | SUBLINGUAL_TABLET | SUBLINGUAL | Status: DC | PRN

## 2012-09-05 MED ORDER — ACETAMINOPHEN 325 MG PO TABS
650.0000 mg | ORAL_TABLET | Freq: Once | ORAL | Status: AC
  Administered 2012-09-05: 650 mg via ORAL
  Filled 2012-09-05: qty 2

## 2012-09-05 MED ORDER — CLONIDINE HCL 0.2 MG PO TABS
0.2000 mg | ORAL_TABLET | Freq: Two times a day (BID) | ORAL | Status: DC
  Administered 2012-09-06 (×2): 0.2 mg via ORAL
  Filled 2012-09-05 (×3): qty 1

## 2012-09-05 MED ORDER — RENA-VITE PO TABS
1.0000 | ORAL_TABLET | Freq: Every day | ORAL | Status: DC
  Administered 2012-09-06: 1 via ORAL
  Filled 2012-09-05 (×2): qty 1

## 2012-09-05 MED ORDER — SODIUM CHLORIDE 0.9 % IV SOLN: 250.0000 mL | INTRAVENOUS | Status: DC | PRN

## 2012-09-05 MED ORDER — ATORVASTATIN CALCIUM 20 MG PO TABS
20.0000 mg | ORAL_TABLET | Freq: Every evening | ORAL | Status: DC
  Filled 2012-09-05: qty 1

## 2012-09-05 MED ORDER — SODIUM CHLORIDE 0.9 % IJ SOLN: 3.0000 mL | INTRAMUSCULAR | Status: DC | PRN

## 2012-09-05 NOTE — Telephone Encounter (Signed)
Patient called wanting to be seen today to do BP being above 200 for the past day.   She is currently at dialysis. I offered her appt today with gene at 1:20, but she could not come due to being at dialysis.    Please call patient on cell phone listed below  [phone]

## 2012-09-05 NOTE — ED Notes (Signed)
Pt reports she came in because her BP was so high and was having some CP during dialysis.

## 2012-09-05 NOTE — H&P (Signed)
Hospital Admission Note Date: 09/05/2012  Patient name: Shawna [name] Medical record number: [id number] Date of birth: [birth year] Age: 72 y.o. Gender: female PCP: [name], MD  Medical Service: IMTS  Attending physician: Dr. Linus Salmons    1st Contact: Dr. Algis Liming   Pager: [phone] 2nd Contact: Dr. Nicoletta Dress    Pager: [phone] After 5 pm or weekends: 1st Contact:      Pager: [phone] 2nd Contact:      Pager: [phone]  Chief Complaint: Chest Pain  History of Present Illness: Ms. Deleo 72 yo AA woman pmh CAD s/p CABG in 12/13 (4 vessel LIMA to LAD, saphenous RCA/Intermediate/circumflex), HTN, and ESRD on HD p/w ongoing chest pain and HTN systolics in [id number]. Pt states that the pain is a "heaviness" that doesn't radiate anywhere and lasts for about 15 mins and then waxes and wanes it is associated with a HA and some fatigue but no diaphoresis, abdominal discomfort, or dizziness. Pt states that she is having some financial difficulty affording some of her medication but has been compliant with her meds except the clonidine patch that she recently d/c on Friday before admission. She has been having some itchy rash 2/2 to patch sites that she has found irritating and removed the pressure. She has a BP monitoring device at home and states that ever since that time she has had systolics in [id number] but no chest discomfort. She has hx of anginal pain in the past and is unsure if it is similar. Otherwise she was in her usual state of health w/o fever/chills, n/v/d/c, sick contacts, cough, weakness, or tinnitus. She has not had any trauma to the area either. She is not a smoker and doesn't use alcohol or other illicit drugs.   Meds: Current Outpatient Rx  Name  Route  Sig  Dispense  Refill  . ALPRAZolam (XANAX) 0.5 MG tablet   Oral   Take 0.5 mg by mouth at bedtime. Take every night per patient.For sleep         . amLODipine (NORVASC) 10 MG tablet   Oral   Take 10 mg by mouth daily.         Marland Kitchen ascorbic acid  (VITAMIN C) 500 MG tablet   Oral   Take 500 mg by mouth daily. Only take for 28 days         . aspirin 81 MG chewable tablet   Oral   Chew 81 mg by mouth daily.         Marland Kitchen atorvastatin (LIPITOR) 20 MG tablet   Oral   Take 20 mg by mouth every evening.          . cloNIDine (CATAPRES) 0.2 MG tablet   Oral   Take 0.2 mg by mouth daily.         Marland Kitchen dextromethorphan 15 MG/5ML syrup   Oral   Take 10 mLs by mouth every 6 (six) hours. For cough         . fluticasone (FLONASE) 50 MCG/ACT nasal spray   Nasal   Place 2 sprays into the nose daily.         . folic acid (FOLVITE) 1 MG tablet   Oral   Take 1 tablet (1 mg total) by mouth daily.   30 tablet   6   . folic acid-vitamin b complex-vitamin c-selenium-zinc (DIALYVITE) 3 MG TABS   Oral   Take 1 tablet by mouth daily.         . hydrocortisone cream 1 %  Topical   Apply 1 application topically 2 (two) times daily as needed. For rash         . iron polysaccharides (NIFEREX) 150 MG capsule   Oral   Take 150 mg by mouth daily.         Marland Kitchen lanthanum (FOSRENOL) 1000 MG chewable tablet   Oral   Chew 1,000 mg by mouth 3 (three) times daily after meals.         . lidocaine-prilocaine (EMLA) cream   Topical   Apply 1 application topically as needed. For pain         . loratadine (CLARITIN) 10 MG tablet   Oral   Take 10 mg by mouth daily. For allergies.         Marland Kitchen losartan (COZAAR) 100 MG tablet   Oral   Take 1 tablet (100 mg total) by mouth daily.   30 tablet   6   . nitroGLYCERIN (NITROSTAT) 0.4 MG SL tablet   Sublingual   Place 0.4 mg under the tongue every 5 (five) minutes as needed for chest pain. For chest pain         . pantoprazole (PROTONIX) 20 MG tablet   Oral   Take 20 mg by mouth 2 (two) times daily.         . Ticagrelor (BRILINTA) 90 MG TABS tablet   Oral   Take 90 mg by mouth 2 (two) times daily.         . traMADol (ULTRAM) 50 MG tablet   Oral   Take 50 mg by mouth every 6  (six) hours as needed. For pain           Allergies: Allergies as of 09/05/2012 - Review Complete 09/05/2012  Allergen Reaction Noted  . Aspirin Other (See Comments) 12/15/2011  . Contrast media (iodinated diagnostic agents) Itching 12/15/2011  . Iron Itching and Other (See Comments) 05/02/2012  . Macrodantin (nitrofurantoin macrocrystal) Other (See Comments) 12/15/2011  . Penicillins Other (See Comments) 12/15/2011  . Plavix (clopidogrel bisulfate) Rash 01/05/2012  . Bactrim (sulfamethoxazole w-trimethoprim) Rash 12/15/2011  . Sulfa antibiotics Rash 12/15/2011  . Venofer (ferric oxide) Itching 05/02/2012   Past Medical History  Diagnosis Date  . Essential hypertension, benign   . Chronic bronchitis   . GERD (gastroesophageal reflux disease)   . PUD (peptic ulcer disease)   . History of lower GI bleeding   . Arthritis   . History of gout   . CAD (coronary artery disease)     a. 12/2011 NSTEMI/Cath/PCI LCX (2.25x14 Resolute DES) & D1 (2.25x22 Resolute DES);  b. 01/2012 Cath/PCI: LM 30, LAD 30p, 40-15m, D1 stent ok, 99 in sm branch of diag, LCX patent stent, OM1 20, RCA 95 ost (4.0x12 Promus DES), EF 55%;  c. 04/2012 Lexi Cardiolite  EF 48%, small area of scar @ base/mid inflat wall with mild peri-infarct ischemia.; CABG 12/4  . High cholesterol 12/2011  . Chronic diastolic CHF (congestive heart failure)     a. 02/2012 Echo EF 60-65%, nl wall motion, Gr 1 DD, mod MR  . Pneumonia ~ 2009  . Iron deficiency anemia   . TIA (transient ischemic attack)   . Anxiety   . History of blood transfusion 07/2011; 12/2011; 01/2012 X 2; 04/2012  . Carotid artery disease     a. A999333 LICA, [id number]   . Mitral regurgitation     a. Moderate by echo, 02/2012  . Myocardial infarction   . Ovarian cancer 1992  .  Colon cancer 1992  . ESRD (end stage renal disease) on dialysis 12/15/11    a. HD @ [name]; Mon, Wed, Fri   Past Surgical History  Procedure Laterality Date  . Abdominal hysterectomy  1992   . Appendectomy  06/1990  . Tubal ligation  1980's  . Av fistula placement  07/2009    left upper arm  . Thrombectomy / arteriovenous graft revision  2011    left upper arm  . Colon resection  1992  . Esophagogastroduodenoscopy  01/20/2012    Procedure: ESOPHAGOGASTRODUODENOSCOPY (EGD);  Surgeon: Ladene Artist, MD,FACG;  Location: [facility] ENDOSCOPY;  Service: Endoscopy;  Laterality: N/A;  . Dilation and curettage of uterus    . Coronary angioplasty with stent placement  12/15/11    "2"  . Coronary angioplasty with stent placement  y/2013    "1; makes total of 3" (05/02/2012)  . Coronary artery bypass graft  06/13/2012    Procedure: CORONARY ARTERY BYPASS GRAFTING (CABG);  Surgeon: Grace Isaac, MD;  Location: [place];  Service: Open Heart Surgery;  Laterality: N/A;  cabg x four;  using left internal mammary artery, and left leg greater saphenous vein harvested endoscopically  . Intraoperative transesophageal echocardiogram  06/13/2012    Procedure: INTRAOPERATIVE TRANSESOPHAGEAL ECHOCARDIOGRAM;  Surgeon: Grace Isaac, MD;  Location: [place];  Service: Open Heart Surgery;  Laterality: N/A;  . Colectomy  1992   Family History  Problem Relation Age of Onset  . Other      noncontributory for early CAD   History   Social History  . Marital Status: Married    Spouse Name: N/A    Number of Children: N/A  . Years of Education: N/A   Occupational History  . Not on file.   Social History Main Topics  . Smoking status: Never Smoker   . Smokeless tobacco: Never Used  . Alcohol Use: No  . Drug Use: No  . Sexually Active: Yes   Other Topics Concern  . Not on file   Social History Narrative   Lives in [place], [place] with husband.  Dialysis pt - [facility].    Review of Systems: Pertinent items are noted in HPI.  Physical Exam: Blood pressure 192/72, pulse 109, temperature 98.1 F (36.7 C), temperature source Oral, resp. rate 16, height 5\' 1"  (1.549 m), weight 141 lb (63.957 kg), SpO2  100.00%. General: resting in bed, NAD, dry MM HEENT: PERRL, EOMI, no scleral icterus Cardiac: RRR, no rubs, murmurs or gallops, large well healing midsternal incision, nttp Pulm: clear to auscultation bilaterally, moving normal volumes of air Abd: soft, nontender, nondistended, BS present Ext: warm and well perfused, no pedal edema Neuro: alert and oriented X3, cranial nerves II-XII grossly intact   Lab results: Basic Metabolic Panel:  Recent Labs  09/05/12 1610  NA 141  K 3.7  CL 100  CO2 26  GLUCOSE 109*  BUN 14  CREATININE 3.25*  CALCIUM 9.4   Liver Function Tests:  Recent Labs  09/05/12 1610  AST 20  ALT 13  ALKPHOS 68  BILITOT 0.3  PROT 6.7  ALBUMIN 3.6   CBC:  Recent Labs  09/05/12 1610  WBC 7.7  HGB 9.7*  HCT 29.5*  MCV 90.5  PLT 268   Imaging results:  Dg Chest 2 View  09/05/2012  *RADIOLOGY REPORT*  Clinical Data: Hypertension, chest pain.  CHEST - 2 VIEW  Comparison: 08/07/2012  Findings: Prior CABG.  Heart is borderline in size.  No  confluent airspace opacities.  No effusions.  No acute bony abnormality.  IMPRESSION: No acute cardiopulmonary disease.   Original Report Authenticated By: Rolm Baptise, M.D.     Other results: EKG: normal sinus rhythm, ST depression in inferior-lateral leads and T-wave inversion V5-6 when compared to previous.  Assessment & Plan by Problem: 72 yo woman pmh 4 vessel CABG, HTN, and ESRD on HD p/w CP and HTN.   1. Chest pain 2/2 demand ischemia vs rebound HTN: Pt has recent CABG 12/13 and has f/u with cardiology and cardiothoracic in [place] but has not been able to establish care in [place], [place]. Pt also didn't attend cardiac rehab as planned 2/2 finances. Pt did suddenly take off her clonidine patch w/o consulting her physician and has had elevated systolics since that time. Pt was at dialysis and continued to have higher systolics [id number] despite 0.2mg  clonidine. Initial troponin is negative. CXR unremarkable. Cardiology  was called and made aware of pt feel that this is not typical CP.  -given amlodipine 10mg  in ED -prn hydralazine -cycle troponins (if any positive call cards back) -repeat EKG in AM  2. ESRD: pt usual MWF and presented from HD after a successful completion. Per pt reports having some hypotension usually during runs.    Dispo: Disposition is deferred at this time, awaiting improvement of current medical problems. Anticipated discharge in approximately 1 day(s).   The patient does have a current PCP (HOWARD, KEVIN, MD), therefore will be requiring [facility]PC follow-up after discharge.   The patient does not have transportation limitations that hinder transportation to clinic appointments.  Signed: Clinton Gallant 09/05/2012, 6:56 PM  [phone]

## 2012-09-05 NOTE — ED Notes (Signed)
Pt with sbp in 200's while at dialysis.  She took 0.2 mg clonidine at 1330 and bp remains 186/119.  Denies headache, but c/o L chest pressure while at dialysis.  Called her renal MD and he stated to come to ED.  Dialysis was completed.

## 2012-09-05 NOTE — Progress Notes (Signed)
Talked to [name] Cardiologist Dr. Verl Blalock with regards to patient's chest pressure and EKG changes. Also went over patient's CABG OR note from 06/13/13. Dr. Verl Blalock agreed with cycling CE tonight and monitor patient. Call cardiology if positive troponin. No other treatment indicated by Dr. Verl Blalock tonight.

## 2012-09-05 NOTE — ED Provider Notes (Signed)
History     CSN: [id number]  Arrival date & time 09/05/12  1536   First MD Initiated Contact with Patient 09/05/12 1617      Chief Complaint  Patient presents with  . Hypertension  . Chest Pain     HPI Pt was seen at 1635.   Per pt, c/o gradual onset and persistence of multiple intermittent episodes of chest "pain" since this morning.  Pt describes the CP as "heaviness," lasting approx 15 minutes each episode.  Pt states she was at her usual HD today when "it felt worse."  States she was told at HD that her SBP was "in the 200's" at the time.  Pt took clonidine 0.2mg  tabs at 1330 without change in BP or CP.  Currently denies CP, states it has resolved with her resting on the stretcher.  Denies SOB/cough, no palpitations, no abd pain, no N/V/D, no back pain.     Past Medical History  Diagnosis Date  . Essential hypertension, benign   . Chronic bronchitis   . GERD (gastroesophageal reflux disease)   . PUD (peptic ulcer disease)   . History of lower GI bleeding   . Arthritis   . History of gout   . CAD (coronary artery disease)     a. 12/2011 NSTEMI/Cath/PCI LCX (2.25x14 Resolute DES) & D1 (2.25x22 Resolute DES);  b. 01/2012 Cath/PCI: LM 30, LAD 30p, 40-14m, D1 stent ok, 99 in sm branch of diag, LCX patent stent, OM1 20, RCA 95 ost (4.0x12 Promus DES), EF 55%;  c. 04/2012 Lexi Cardiolite  EF 48%, small area of scar @ base/mid inflat wall with mild peri-infarct ischemia.; CABG 12/4  . High cholesterol 12/2011  . Chronic diastolic CHF (congestive heart failure)     a. 02/2012 Echo EF 60-65%, nl wall motion, Gr 1 DD, mod MR  . Pneumonia ~ 2009  . Iron deficiency anemia   . TIA (transient ischemic attack)   . Anxiety   . History of blood transfusion 07/2011; 12/2011; 01/2012 X 2; 04/2012  . Carotid artery disease     a. [id number] LICA, [id number]   . Mitral regurgitation     a. Moderate by echo, 02/2012  . Myocardial infarction   . Ovarian cancer 1992  . Colon cancer 1992  . ESRD (end stage  renal disease) on dialysis 12/15/11    a. HD @ [facility]; Mon, Wed, Fri    Past Surgical History  Procedure Laterality Date  . Abdominal hysterectomy  1992  . Appendectomy  06/1990  . Tubal ligation  1980's  . Av fistula placement  07/2009    left upper arm  . Thrombectomy / arteriovenous graft revision  2011    left upper arm  . Colon resection  1992  . Esophagogastroduodenoscopy  01/20/2012    Procedure: ESOPHAGOGASTRODUODENOSCOPY (EGD);  Surgeon: Ladene Artist, MD,FACG;  Location: [facility] ENDOSCOPY;  Service: Endoscopy;  Laterality: N/A;  . Dilation and curettage of uterus    . Coronary angioplasty with stent placement  12/15/11    "2"  . Coronary angioplasty with stent placement  y/2013    "1; makes total of 3" (05/02/2012)  . Coronary artery bypass graft  06/13/2012    Procedure: CORONARY ARTERY BYPASS GRAFTING (CABG);  Surgeon: Grace Isaac, MD;  Location: [place];  Service: Open Heart Surgery;  Laterality: N/A;  cabg x four;  using left internal mammary artery, and left leg greater saphenous vein harvested endoscopically  . Intraoperative transesophageal echocardiogram  06/13/2012    Procedure: INTRAOPERATIVE TRANSESOPHAGEAL ECHOCARDIOGRAM;  Surgeon: Grace Isaac, MD;  Location: [place];  Service: Open Heart Surgery;  Laterality: N/A;  . Colectomy  1992    Family History  Problem Relation Age of Onset  . Other      noncontributory for early CAD    History  Substance Use Topics  . Smoking status: Never Smoker   . Smokeless tobacco: Never Used  . Alcohol Use: No    Review of Systems ROS: Statement: All systems negative except as marked or noted in the HPI; Constitutional: Negative for fever and chills. ; ; Eyes: Negative for eye pain, redness and discharge. ; ; ENMT: Negative for ear pain, hoarseness, nasal congestion, sinus pressure and sore throat. ; ; Cardiovascular: +CP. Negative for palpitations, diaphoresis, dyspnea and peripheral edema. ; ; Respiratory: Negative for  cough, wheezing and stridor. ; ; Gastrointestinal: Negative for nausea, vomiting, diarrhea, abdominal pain, blood in stool, hematemesis, jaundice and rectal bleeding. . ; ; Genitourinary: Negative for dysuria, flank pain and hematuria. ; ; Musculoskeletal: Negative for back pain and neck pain. Negative for swelling and trauma.; ; Skin: Negative for pruritus, rash, abrasions, blisters, bruising and skin lesion.; ; Neuro: Negative for headache, lightheadedness and neck stiffness. Negative for weakness, altered level of consciousness , altered mental status, extremity weakness, paresthesias, involuntary movement, seizure and syncope.       Allergies  Aspirin; Contrast media; Iron; Macrodantin; Penicillins; Plavix; Bactrim; Sulfa antibiotics; and Venofer  Home Medications   Current Outpatient Rx  Name  Route  Sig  Dispense  Refill  . ALPRAZolam (XANAX) 0.5 MG tablet   Oral   Take 0.5 mg by mouth at bedtime. Take every night per patient.For sleep         . amLODipine (NORVASC) 10 MG tablet   Oral   Take 10 mg by mouth daily.         Marland Kitchen ascorbic acid (VITAMIN C) 500 MG tablet   Oral   Take 500 mg by mouth daily. Only take for 28 days         . aspirin 81 MG chewable tablet   Oral   Chew 81 mg by mouth daily.         Marland Kitchen atorvastatin (LIPITOR) 20 MG tablet   Oral   Take 20 mg by mouth every evening.          . cloNIDine (CATAPRES) 0.2 MG tablet   Oral   Take 0.2 mg by mouth daily.         Marland Kitchen dextromethorphan 15 MG/5ML syrup   Oral   Take 10 mLs by mouth every 6 (six) hours. For cough         . fluticasone (FLONASE) 50 MCG/ACT nasal spray   Nasal   Place 2 sprays into the nose daily.         . folic acid (FOLVITE) 1 MG tablet   Oral   Take 1 tablet (1 mg total) by mouth daily.   30 tablet   6   . folic acid-vitamin b complex-vitamin c-selenium-zinc (DIALYVITE) 3 MG TABS   Oral   Take 1 tablet by mouth daily.         . hydrocortisone cream 1 %   Topical    Apply 1 application topically 2 (two) times daily as needed. For rash         . iron polysaccharides (NIFEREX) 150 MG capsule   Oral   Take  150 mg by mouth daily.         Marland Kitchen lanthanum (FOSRENOL) 1000 MG chewable tablet   Oral   Chew 1,000 mg by mouth 3 (three) times daily after meals.         . lidocaine-prilocaine (EMLA) cream   Topical   Apply 1 application topically as needed. For pain         . loratadine (CLARITIN) 10 MG tablet   Oral   Take 10 mg by mouth daily. For allergies.         Marland Kitchen losartan (COZAAR) 100 MG tablet   Oral   Take 1 tablet (100 mg total) by mouth daily.   30 tablet   6   . nitroGLYCERIN (NITROSTAT) 0.4 MG SL tablet   Sublingual   Place 0.4 mg under the tongue every 5 (five) minutes as needed for chest pain. For chest pain         . pantoprazole (PROTONIX) 20 MG tablet   Oral   Take 20 mg by mouth 2 (two) times daily.         . Ticagrelor (BRILINTA) 90 MG TABS tablet   Oral   Take 90 mg by mouth 2 (two) times daily.         . traMADol (ULTRAM) 50 MG tablet   Oral   Take 50 mg by mouth every 6 (six) hours as needed. For pain           BP 186/119  Pulse 109  Temp(Src) 98.1 F (36.7 C) (Oral)  Resp 18  Ht 5\' 1"  (1.549 m)  Wt 141 lb (63.957 kg)  BMI 26.66 kg/m2  SpO2 100%  Physical Exam 1640: Physical examination:  Nursing notes reviewed; Vital signs and O2 SAT reviewed;  Constitutional: Well developed, Well nourished, Well hydrated, In no acute distress; Head:  Normocephalic, atraumatic; Eyes: EOMI, PERRL, No scleral icterus; ENMT: Mouth and pharynx normal, Mucous membranes moist; Neck: Supple, Full range of motion, No lymphadenopathy; Cardiovascular: Regular rate and rhythm, No gallop; Respiratory: Breath sounds clear & equal bilaterally, No rales, rhonchi, wheezes.  Speaking full sentences with ease, Normal respiratory effort/excursion; Chest: Nontender, Movement normal; Abdomen: Soft, Nontender, Nondistended, Normal bowel  sounds;; Extremities: Pulses normal, No tenderness, No edema, No calf edema or asymmetry.; Neuro: AA&Ox3, Major CN grossly intact.  Speech clear. No gross focal motor or sensory deficits in extremities.; Skin: Color normal, Warm, Dry.   ED Course  Procedures    MDM  MDM Reviewed: previous chart, nursing note and vitals Reviewed previous: labs and ECG Interpretation: labs, ECG and x-ray    Date: 09/05/2012  Rate: 86  Rhythm: normal sinus rhythm  QRS Axis: normal  Intervals: normal  ST/T Wave abnormalities: nonspecific ST/T changes, STTW changes lateral leads, ST depression inferior leads  Conduction Disutrbances:none  Narrative Interpretation:   Old EKG Reviewed: changes noted; more pronounced STTW changes in lateral leads, new ST depressions inferior leads compared to previous EKG dated 08/05/2012.   Results for orders placed during the hospital encounter of 09/05/12  CBC      Result Value Range   WBC 7.7  4.0 - 10.5 K/uL   RBC 3.26 (*) 3.87 - 5.11 MIL/uL   Hemoglobin 9.7 (*) 12.0 - 15.0 g/dL   HCT 29.5 (*) 36.0 - 46.0 %   MCV 90.5  78.0 - 100.0 fL   MCH 29.8  26.0 - 34.0 pg   MCHC 32.9  30.0 - 36.0 g/dL   RDW 16.9 (*)  11.5 - 15.5 %   Platelets 268  150 - 400 K/uL  COMPREHENSIVE METABOLIC PANEL      Result Value Range   Sodium 141  135 - 145 mEq/L   Potassium 3.7  3.5 - 5.1 mEq/L   Chloride 100  96 - 112 mEq/L   CO2 26  19 - 32 mEq/L   Glucose, Bld 109 (*) 70 - 99 mg/dL   BUN 14  6 - 23 mg/dL   Creatinine, Ser 3.25 (*) 0.50 - 1.10 mg/dL   Calcium 9.4  8.4 - 10.5 mg/dL   Total Protein 6.7  6.0 - 8.3 g/dL   Albumin 3.6  3.5 - 5.2 g/dL   AST 20  0 - 37 U/L   ALT 13  0 - 35 U/L   Alkaline Phosphatase 68  39 - 117 U/L   Total Bilirubin 0.3  0.3 - 1.2 mg/dL   GFR calc non Af Amer 13 (*) >90 mL/min   GFR calc Af Amer 15 (*) >90 mL/min  POCT I-STAT TROPONIN I      Result Value Range   Troponin i, poc 0.07  0.00 - 0.08 ng/mL   Comment 3            Dg Chest 2  View 09/05/2012  *RADIOLOGY REPORT*  Clinical Data: Hypertension, chest pain.  CHEST - 2 VIEW  Comparison: 08/07/2012  Findings: Prior CABG.  Heart is borderline in size.  No confluent airspace opacities.  No effusions.  No acute bony abnormality.  IMPRESSION: No acute cardiopulmonary disease.   Original Report Authenticated By: Rolm Baptise, M.D.     Results for ROSHAWNDA, BRUESCH (MRN [id number]) as of 09/05/2012 17:56  Ref. Range 07/09/2012 14:00 07/11/2012 06:55 08/05/2012 23:25 08/05/2012 23:33 09/05/2012 16:10  Hemoglobin Latest Range: 12.0-15.0 g/dL 10.2 (L) 10.7 (L) 10.8 (L) 11.9 (L) 9.7 (L)  HCT Latest Range: 36.0-46.0 % 31.2 (L) 33.4 (L) 34.1 (L) 35.0 (L) 29.5 (L)    1745:  Continues to deny CP while in the ED.  BP stable.  Multiple risk factors for ACS.  Dx and testing d/w pt and family.  Questions answered.  Verb understanding, agreeable to observation admit.  T/C to [facility] Resident, case discussed, including:  HPI, pertinent PM/SHx, VS/PE, dx testing, ED course and treatment:  Agreeable to observation admit, requests they will come to ED for eval.             Alfonzo Feller, DO 09/07/12 1248

## 2012-09-06 LAB — TROPONIN I
Troponin I: <0.3 ng/mL (ref <0.30)
Troponin I: <0.3 ng/mL (ref <0.30)

## 2012-09-06 LAB — CK TOTAL AND CKMB (NOT AT ARMC)
CK, MB: 3.9 ng/mL (ref 0.3–4.0)
CK, MB: 4.1 ng/mL — ABNORMAL HIGH (ref 0.3–4.0)
Relative Index: 3.9 — ABNORMAL HIGH (ref 0.0–2.5)
Total CK: 117 U/L (ref 7–177)

## 2012-09-06 LAB — CBC
MCV: 92.7 fL (ref 78.0–100.0)
Platelets: 263 10*3/uL (ref 150–400)
RBC: 3.15 MIL/uL — ABNORMAL LOW (ref 3.87–5.11)
RDW: 17.4 % — ABNORMAL HIGH (ref 11.5–15.5)
WBC: 5.3 10*3/uL (ref 4.0–10.5)

## 2012-09-06 LAB — BASIC METABOLIC PANEL
CO2: 26 mEq/L (ref 19–32)
Calcium: 9.8 mg/dL (ref 8.4–10.5)
Chloride: 102 mEq/L (ref 96–112)
Creatinine, Ser: 5.2 mg/dL — ABNORMAL HIGH (ref 0.50–1.10)
GFR calc Af Amer: 9 mL/min — ABNORMAL LOW (ref >90)
Sodium: 140 mEq/L (ref 135–145)

## 2012-09-06 LAB — APTT: aPTT: 36 seconds (ref 24–37)

## 2012-09-06 LAB — TSH: TSH: 1.758 u[IU]/mL (ref 0.350–4.500)

## 2012-09-06 MED ORDER — CLONIDINE HCL 0.2 MG PO TABS: 0.2000 mg | ORAL_TABLET | Freq: Two times a day (BID) | ORAL | Status: DC

## 2012-09-06 MED ORDER — CLONIDINE HCL 0.2 MG PO TABS: 0.2000 mg | ORAL_TABLET | Freq: Every day | ORAL | Status: DC

## 2012-09-06 NOTE — Telephone Encounter (Signed)
Patient currently admitted, presented to ED yesterday at 3:36 pm.

## 2012-09-06 NOTE — Progress Notes (Signed)
D/c orders received;IV removed with gauze on, pt remains instable condition, pt meds and instructions reviewed and given to pt; pt d/c to home, reminded pt to take meds as prescribed

## 2012-09-06 NOTE — Progress Notes (Signed)
Subjective: Pt doing well this AM. BP normalizing 150s/70s. Pt having no recurrent CP or pressure like sensations.   Objective: Vital signs in last 24 hours: Filed Vitals:   09/05/12 2114 09/05/12 2215 09/05/12 2314 09/06/12 0500  BP: 193/82 177/76 197/73 158/71  Pulse:  80 85 69  Temp:   97.8 F (36.6 C) 97.7 F (36.5 C)  TempSrc:      Resp:  19 21 18   Height:   5\' 1"  (1.549 m)   Weight:   137 lb 3.2 oz (62.234 kg) 137 lb 3.2 oz (62.234 kg)  SpO2:  95% 98% 97%   Weight change:   Intake/Output Summary (Last 24 hours) at 09/06/12 0857 Last data filed at 09/06/12 0819  Gross per 24 hour  Intake    240 ml  Output    200 ml  Net     40 ml   General: resting in bed, NAD HEENT: PERRL, EOMI, no scleral icterus Cardiac: RRR, no rubs, murmurs or gallops, large well healing midsternal incision, nttp Pulm: clear to auscultation bilaterally, moving normal volumes of air Abd: soft, nontender, nondistended, BS present Ext: warm and well perfused, no pedal edema Neuro: alert and oriented X3, cranial nerves II-XII grossly intact  Lab Results: Basic Metabolic Panel:  Recent Labs Lab 09/05/12 1610 09/06/12 0557  NA 141 140  K 3.7 4.2  CL 100 102  CO2 26 26  GLUCOSE 109* 99  BUN 14 25*  CREATININE 3.25* 5.20*  CALCIUM 9.4 9.8   Liver Function Tests:  Recent Labs Lab 09/05/12 1610  AST 20  ALT 13  ALKPHOS 68  BILITOT 0.3  PROT 6.7  ALBUMIN 3.6   CBC:  Recent Labs Lab 09/05/12 1610 09/06/12 0557  WBC 7.7 5.3  HGB 9.7* 9.5*  HCT 29.5* 29.2*  MCV 90.5 92.7  PLT 268 263   Cardiac Enzymes:  Recent Labs Lab 09/05/12 2334 09/06/12 0557  CKTOTAL 117 104  CKMB 4.1* 4.2*  TROPONINI <0.30 <0.30   BNP:  Recent Labs Lab 09/05/12 2334  PROBNP 5229.0*   Thyroid Function Tests:  Recent Labs Lab 09/05/12 2334  TSH 1.758   Coagulation:  Recent Labs Lab 09/05/12 2334  LABPROT 13.1  INR 1.00   Studies/Results: Dg Chest 2 View  09/05/2012   *RADIOLOGY REPORT*  Clinical Data: Hypertension, chest pain.  CHEST - 2 VIEW  Comparison: 08/07/2012  Findings: Prior CABG.  Heart is borderline in size.  No confluent airspace opacities.  No effusions.  No acute bony abnormality.  IMPRESSION: No acute cardiopulmonary disease.   Original Report Authenticated By: Rolm Baptise, M.D.    Medications: I have reviewed the patient's current medications. Scheduled Meds: . ALPRAZolam  0.5 mg Oral QHS  . amLODipine  10 mg Oral Daily  . aspirin EC  81 mg Oral Daily  . atorvastatin  20 mg Oral QPM  . cloNIDine  0.2 mg Oral BID  . fluticasone  2 spray Each Nare Daily  . folic acid  1 mg Oral Daily  . heparin  5,000 Units Subcutaneous Q8H  . iron polysaccharides  150 mg Oral Daily  . lanthanum  1,000 mg Oral TID PC  . loratadine  10 mg Oral Daily  . losartan  100 mg Oral Daily  . multivitamin  1 tablet Oral QHS  . pantoprazole  20 mg Oral BID  . sodium chloride  3 mL Intravenous Q12H  . sodium chloride  3 mL Intravenous Q12H  . Ticagrelor  90 mg Oral BID   Continuous Infusions:  PRN Meds:.sodium chloride, nitroGLYCERIN, sodium chloride, traMADol Assessment/Plan: 72 yo woman pmh 4 vessel CABG, HTN, and ESRD on HD p/w CP and HTN.   1. Chest pain 2/2 demand ischemia vs rebound HTN: Pt has recent CABG 12/13 and has f/u with cardiology and cardiothoracic in [place] but has not been able to establish care in [place], [place]. Pt also didn't attend cardiac rehab as planned 2/2 finances. Pt did suddenly take off her clonidine patch w/o consulting her physician and has had elevated systolics since that time. Pt was at dialysis and continued to have higher systolics [id number] despite 0.2mg  clonidine. Initial troponin is negative. CXR unremarkable. Cardiology was called and made aware of pt feel that this is not typical CP. Pt didn't require any hydralazine overnight.  -given amlodipine 10mg  in ED  -d/c with clonidine 0.2mg  substitute for patch  2. ESRD: pt usual MWF and  presented from HD after a successful completion. Per pt reports having some hypotension usually during runs.    Dispo: Disposition is deferred at this time, awaiting improvement of current medical problems.  Anticipated discharge in approximately 1 day(s).   The patient does have a current PCP (HOWARD, KEVIN, MD), therefore will be requiring OPC follow-up after discharge.   The patient does not have transportation limitations that hinder transportation to clinic appointments.  .Services Needed at time of discharge: Y = Yes, Blank = No PT:   OT:   RN:   Equipment:   Other:     LOS: 1 day   Clinton Gallant 09/06/2012, 8:57 AM [id number]

## 2012-09-06 NOTE — Discharge Summary (Signed)
[facility] Discharge Note  Name: Shawna [name] MRN: [id number] DOB: 09/26/1940 72 y.o.  Date of Admission: 09/05/2012  4:08 PM Date of Discharge: 09/06/2012 Attending Physician: Thayer Headings, MD  Discharge Diagnosis: Principal Problem:   Chest pain Active Problems:   Coronary atherosclerosis of native coronary artery   Essential hypertension, benign   ESRD (end stage renal disease)   HLD (hyperlipidemia)   Anemia of chronic kidney failure   Secondary hyperparathyroidism   Hx of CABG   Discharge Medications:   Medication List    ASK your doctor about these medications       ALPRAZolam 0.5 MG tablet  Commonly known as:  XANAX  Take 0.5 mg by mouth at bedtime. Take every night per patient.For sleep     amLODipine 10 MG tablet  Commonly known as:  NORVASC  Take 10 mg by mouth daily.     ascorbic acid 500 MG tablet  Commonly known as:  VITAMIN C  Take 500 mg by mouth daily. Only take for 28 days     aspirin 81 MG chewable tablet  Chew 81 mg by mouth daily.     atorvastatin 20 MG tablet  Commonly known as:  LIPITOR  Take 20 mg by mouth every evening.     cloNIDine 0.2 MG tablet  Commonly known as:  CATAPRES  Take 0.2 mg by mouth daily.     dextromethorphan 15 MG/5ML syrup  Take 10 mLs by mouth every 6 (six) hours. For cough     fluticasone 50 MCG/ACT nasal spray  Commonly known as:  FLONASE  Place 2 sprays into the nose daily.     folic acid 1 MG tablet  Commonly known as:  FOLVITE  Take 1 tablet (1 mg total) by mouth daily.     folic acid-vitamin b complex-vitamin c-selenium-zinc 3 MG Tabs  Take 1 tablet by mouth daily.     hydrocortisone cream 1 %  Apply 1 application topically 2 (two) times daily as needed. For rash     iron polysaccharides 150 MG capsule  Commonly known as:  NIFEREX  Take 150 mg by mouth daily.     lanthanum 1000 MG chewable tablet  Commonly known as:  FOSRENOL  Chew 1,000 mg by mouth 3 (three)  times daily after meals.     lidocaine-prilocaine cream  Commonly known as:  EMLA  Apply 1 application topically as needed. For pain     loratadine 10 MG tablet  Commonly known as:  CLARITIN  Take 10 mg by mouth daily. For allergies.     losartan 100 MG tablet  Commonly known as:  COZAAR  Take 1 tablet (100 mg total) by mouth daily.     nitroGLYCERIN 0.4 MG SL tablet  Commonly known as:  NITROSTAT  Place 0.4 mg under the tongue every 5 (five) minutes as needed for chest pain. For chest pain     pantoprazole 20 MG tablet  Commonly known as:  PROTONIX  Take 20 mg by mouth 2 (two) times daily.     Ticagrelor 90 MG Tabs tablet  Commonly known as:  BRILINTA  Take 90 mg by mouth 2 (two) times daily.     traMADol 50 MG tablet  Commonly known as:  ULTRAM  Take 50 mg by mouth every 6 (six) hours as needed. For pain        Disposition and follow-up:   Ms.Shawna [name] was discharged from [facility] in Stable condition.  At the hospital follow up visit please address HTN with medication compliance.   Follow-up Appointments:       Future Appointments Provider Department Dept Phone   09/14/2012 2:40 PM Aurora Mask, PA [address] [place] ([place]) [phone]   10/29/2012 3:00 PM Satira Sark, MD [place] [address] [place] ([place]) [phone]   12/13/2012 10:30 AM [facility] [id number]   12/13/2012 11:40 AM Princess Perna, NP [facility] [phone]      Consultations:  Cardiology  Procedures Performed:  Dg Chest 2 View  09/05/2012  *RADIOLOGY REPORT*  Clinical Data: Hypertension, chest pain.  CHEST - 2 VIEW  Comparison: 08/07/2012  Findings: Prior CABG.  Heart is borderline in size.  No confluent airspace opacities.  No effusions.  No acute bony abnormality.  IMPRESSION: No acute cardiopulmonary disease.   Original Report Authenticated By: Rolm Baptise, M.D.     Admission HPI: [name] 72 yo AA woman pmh CAD s/p CABG in 12/13 (4 vessel LIMA to LAD, saphenous RCA/Intermediate/circumflex), HTN, and ESRD on HD p/w ongoing chest pain and HTN systolics in [id number]. Pt states that the pain is a "heaviness" that doesn't radiate anywhere and lasts for about 15 mins and then waxes and wanes it is associated with a HA and some fatigue but no diaphoresis, abdominal discomfort, or dizziness. Pt states that she is having some financial difficulty affording some of her medication but has been compliant with her meds except the clonidine patch that she recently d/c on Friday before admission. She has been having some itchy rash 2/2 to patch sites that she has found irritating and removed the pressure. She has a BP monitoring device at home and states that ever since that time she has had systolics in [id number] but no chest discomfort. She has hx of anginal pain in the past and is unsure if it is similar. Otherwise she was in her usual state of health w/o fever/chills, n/v/d/c, sick contacts, cough, weakness, or tinnitus. She has not had any trauma to the area either. She is not a smoker and doesn't use alcohol or other illicit drugs.   Admission Physical Exam:  Blood pressure 192/72, pulse 109, temperature 98.1 F (36.7 C), temperature source Oral, resp. rate 16, height 5\' 1"  (1.549 m), weight 141 lb (63.957 kg), SpO2 100.00%.  General: resting in bed, NAD, dry MM  HEENT: PERRL, EOMI, no scleral icterus  Cardiac: RRR, no rubs, murmurs or gallops, large well healing midsternal incision, nttp  Pulm: clear to auscultation bilaterally, moving normal volumes of air  Abd: soft, nontender, nondistended, BS present  Ext: warm and well perfused, no pedal edema  Neuro: alert and oriented X3, cranial nerves II-XII grossly intact  Hospital Course by problem list: 1. Chest pain 2/2 demand ischemia vs rebound HTN: Pt has recent CABG 12/13 and has f/u with cardiology and cardiothoracic in  [place] but has not been able to establish care in [place], [place]. Pt also didn't attend cardiac rehab as planned 2/2 finances. Pt did suddenly take off her clonidine patch w/o consulting her physician and has had elevated systolics since that time. Pt was at dialysis and continued to have higher systolics [id number] despite 0.2mg  clonidine. Initial troponin is negative. CXR unremarkable. Cardiology was called and made aware of pt feel that this is not typical CP and since negative troponins and good BP response would be ok for no further workup. Pt  was transitioned to oral clonidine 0.2mg  BID instead of clonidine patch and continued on amlodipine 10mg  and losartan 100mg .   2. ESRD: pt usual MWF and presented from HD after a successful completion. Per pt reports having some hypotension usually during runs.   Discharge Vitals:  BP 150/64  Pulse 69  Temp(Src) 97.7 F (36.5 C) (Oral)  Resp 18  Ht 5\' 1"  (1.549 m)  Wt 137 lb 3.2 oz (62.234 kg)  BMI 25.94 kg/m2  SpO2 97% General: resting in bed, NAD  HEENT: PERRL, EOMI, no scleral icterus  Cardiac: RRR, no rubs, murmurs or gallops, large well healing midsternal incision, nttp  Pulm: clear to auscultation bilaterally, moving normal volumes of air  Abd: soft, nontender, nondistended, BS present  Ext: warm and well perfused, no pedal edema  Neuro: alert and oriented X3, cranial nerves II-XII grossly intact  Discharge Labs:  Results for orders placed during the hospital encounter of 09/05/12 (from the past 24 hour(s))  CBC     Status: Abnormal   Collection Time    09/05/12  4:10 PM      Result Value Range   WBC 7.7  4.0 - 10.5 K/uL   RBC 3.26 (*) 3.87 - 5.11 MIL/uL   Hemoglobin 9.7 (*) 12.0 - 15.0 g/dL   HCT 29.5 (*) 36.0 - 46.0 %   MCV 90.5  78.0 - 100.0 fL   MCH 29.8  26.0 - 34.0 pg   MCHC 32.9  30.0 - 36.0 g/dL   RDW 16.9 (*) 11.5 - 15.5 %   Platelets 268  150 - 400 K/uL  COMPREHENSIVE METABOLIC PANEL     Status: Abnormal   Collection Time     09/05/12  4:10 PM      Result Value Range   Sodium 141  135 - 145 mEq/L   Potassium 3.7  3.5 - 5.1 mEq/L   Chloride 100  96 - 112 mEq/L   CO2 26  19 - 32 mEq/L   Glucose, Bld 109 (*) 70 - 99 mg/dL   BUN 14  6 - 23 mg/dL   Creatinine, Ser 3.25 (*) 0.50 - 1.10 mg/dL   Calcium 9.4  8.4 - 10.5 mg/dL   Total Protein 6.7  6.0 - 8.3 g/dL   Albumin 3.6  3.5 - 5.2 g/dL   AST 20  0 - 37 U/L   ALT 13  0 - 35 U/L   Alkaline Phosphatase 68  39 - 117 U/L   Total Bilirubin 0.3  0.3 - 1.2 mg/dL   GFR calc non Af Amer 13 (*) >90 mL/min   GFR calc Af Amer 15 (*) >90 mL/min  POCT I-STAT TROPONIN I     Status: None   Collection Time    09/05/12  4:15 PM      Result Value Range   Troponin i, poc 0.07  0.00 - 0.08 ng/mL   Comment 3           APTT     Status: None   Collection Time    09/05/12 11:34 PM      Result Value Range   aPTT 36  24 - 37 seconds  PROTIME-INR     Status: None   Collection Time    09/05/12 11:34 PM      Result Value Range   Prothrombin Time 13.1  11.6 - 15.2 seconds   INR 1.00  0.00 - 1.49  TSH     Status: None  Collection Time    09/05/12 11:34 PM      Result Value Range   TSH 1.758  0.350 - 4.500 uIU/mL  TROPONIN I     Status: None   Collection Time    09/05/12 11:34 PM      Result Value Range   Troponin I <0.30  <0.30 ng/mL  CK TOTAL AND CKMB     Status: Abnormal   Collection Time    09/05/12 11:34 PM      Result Value Range   Total CK 117  7 - 177 U/L   CK, MB 4.1 (*) 0.3 - 4.0 ng/mL   Relative Index 3.5 (*) 0.0 - 2.5  PRO B NATRIURETIC PEPTIDE     Status: Abnormal   Collection Time    09/05/12 11:34 PM      Result Value Range   Pro B Natriuretic peptide (BNP) 5229.0 (*) 0 - 125 pg/mL  TROPONIN I     Status: None   Collection Time    09/06/12  5:57 AM      Result Value Range   Troponin I <0.30  <0.30 ng/mL  CBC     Status: Abnormal   Collection Time    09/06/12  5:57 AM      Result Value Range   WBC 5.3  4.0 - 10.5 K/uL   RBC 3.15 (*) 3.87 - 5.11  MIL/uL   Hemoglobin 9.5 (*) 12.0 - 15.0 g/dL   HCT 29.2 (*) 36.0 - 46.0 %   MCV 92.7  78.0 - 100.0 fL   MCH 30.2  26.0 - 34.0 pg   MCHC 32.5  30.0 - 36.0 g/dL   RDW 17.4 (*) 11.5 - 15.5 %   Platelets 263  150 - 400 K/uL  BASIC METABOLIC PANEL     Status: Abnormal   Collection Time    09/06/12  5:57 AM      Result Value Range   Sodium 140  135 - 145 mEq/L   Potassium 4.2  3.5 - 5.1 mEq/L   Chloride 102  96 - 112 mEq/L   CO2 26  19 - 32 mEq/L   Glucose, Bld 99  70 - 99 mg/dL   BUN 25 (*) 6 - 23 mg/dL   Creatinine, Ser 5.20 (*) 0.50 - 1.10 mg/dL   Calcium 9.8  8.4 - 10.5 mg/dL   GFR calc non Af Amer 7 (*) >90 mL/min   GFR calc Af Amer 9 (*) >90 mL/min  CK TOTAL AND CKMB     Status: Abnormal   Collection Time    09/06/12  5:57 AM      Result Value Range   Total CK 104  7 - 177 U/L   CK, MB 4.2 (*) 0.3 - 4.0 ng/mL   Relative Index 4.0 (*) 0.0 - 2.5    Signed: Clinton Gallant 09/06/2012, 10:53 AM   Time Spent on Discharge: 30 min Services Ordered on Discharge: none Equipment Ordered on Discharge: none

## 2012-09-06 NOTE — H&P (Signed)
Patient seen and examined, history reviewed with resident team.   Recent NSTEMI and CABG came in with heaviness and elevated BP over 123456 systolic.  Stopped clonidine recently.  Now without chest pain and enzymes negative.  Cardiology does not feel other work up indicated.

## 2012-09-07 NOTE — Discharge Summary (Signed)
Plan discussed with resident team and I agree with above as outlined.

## 2012-09-14 ENCOUNTER — Ambulatory Visit: Payer: Medicare Other | Admitting: Physician Assistant

## 2012-09-20 ENCOUNTER — Ambulatory Visit (INDEPENDENT_AMBULATORY_CARE_PROVIDER_SITE_OTHER): Payer: Medicare Other | Admitting: Physician Assistant

## 2012-09-20 ENCOUNTER — Encounter: Payer: Self-pay | Admitting: Physician Assistant

## 2012-09-20 VITALS — BP 148/78 | HR 89 | Ht 61.0 in | Wt 138.6 lb

## 2012-09-20 DIAGNOSIS — E785 Hyperlipidemia, unspecified: Secondary | ICD-10-CM

## 2012-09-20 DIAGNOSIS — Z951 Presence of aortocoronary bypass graft: Secondary | ICD-10-CM

## 2012-09-20 DIAGNOSIS — I10 Essential (primary) hypertension: Secondary | ICD-10-CM

## 2012-09-20 NOTE — Assessment & Plan Note (Signed)
Will reassess lipid status with a FLP. Recommend target LDL 70 or less, if feasible. Continue current low-dose Lipitor, pending review of results.

## 2012-09-20 NOTE — Assessment & Plan Note (Signed)
Quiescent on current medication regimen.

## 2012-09-20 NOTE — Assessment & Plan Note (Signed)
I advised her to reestablish with Dr. Nadara Mustard, her primary M.D., regarding active management of her HTN. She states today that he had initially placed her on labetalol, which reportedly was quite effective in managing her blood pressure.

## 2012-09-20 NOTE — Progress Notes (Signed)
Primary Cardiologist: Johnny Bridge, MD   HPI: Patient returns to office for evaluation of chest pain. She was last seen here on January 16, for post CABG followup, at which time I felt that she was doing well from a cardiac standpoint.  Since then she reportedly has had difficulty maintaining good BP control. She required an overnight stay at [facility] for further evaluation of a reported SBP of 216 during HD. She was placed on hydralazine and reportedly has had better BP control. However, she wondered today why she is no longer on labetalol, which apparently have been quite effective in the past.  From a cardiac standpoint, she denies any interim development of exertional CP. She also denies any symptoms suggestive of congestive heart failure.  Allergies  Allergen Reactions  . Aspirin Other (See Comments)    Mess up her stomach; "makes my bowels have blood in them". Takes 81 mg EC Aspirin   . Contrast Media (Iodinated Diagnostic Agents) Itching  . Iron Itching and Other (See Comments)    "they gave me iron in dialysis; had to give me Benadryl cause I had to have the iron" (05/02/2012)  . Macrodantin (Nitrofurantoin Macrocrystal) Other (See Comments)    "broke me out in big old knots all over my body; had to go to ER"  . Penicillins Other (See Comments)    "makes me real weak when I take it; like I'll pass out"  . Plavix (Clopidogrel Bisulfate) Rash  . Bactrim (Sulfamethoxazole W-Trimethoprim) Rash  . Sulfa Antibiotics Rash  . Venofer (Ferric Oxide) Itching    Patient reports using Benadryl prior to doses as [facility]    Current Outpatient Prescriptions  Medication Sig Dispense Refill  . ALPRAZolam (XANAX) 0.5 MG tablet Take 0.5 mg by mouth at bedtime. Take every night per patient.For sleep      . amLODipine (NORVASC) 10 MG tablet Take 10 mg by mouth daily.      Marland Kitchen ascorbic acid (VITAMIN C) 500 MG tablet Take 500 mg by mouth daily. Only take for 28 days      . aspirin 81 MG chewable  tablet Chew 81 mg by mouth daily.      Marland Kitchen atorvastatin (LIPITOR) 20 MG tablet Take 20 mg by mouth every evening.       . cloNIDine-chlorthalidone (CLORPRES) 0.2-15 MG per tablet Take 1 tablet by mouth 2 (two) times daily.      . fluticasone (FLONASE) 50 MCG/ACT nasal spray Place 2 sprays into the nose daily.      . folic acid (FOLVITE) 1 MG tablet Take 1 tablet (1 mg total) by mouth daily.  30 tablet  6  . folic acid-vitamin b complex-vitamin c-selenium-zinc (DIALYVITE) 3 MG TABS Take 1 tablet by mouth daily.      . hydrALAZINE (APRESOLINE) 25 MG tablet Take 25 mg by mouth 2 (two) times daily.      . hydrocortisone cream 1 % Apply 1 application topically 2 (two) times daily as needed. For rash      . iron polysaccharides (NIFEREX) 150 MG capsule Take 150 mg by mouth daily.      Marland Kitchen lanthanum (FOSRENOL) 1000 MG chewable tablet Chew 1,000 mg by mouth 3 (three) times daily after meals.      . lidocaine-prilocaine (EMLA) cream Apply 1 application topically as needed. For pain      . loratadine (CLARITIN) 10 MG tablet Take 10 mg by mouth daily. For allergies.      Marland Kitchen LORazepam (  ATIVAN) 1 MG tablet Take 1 mg by mouth 3 (three) times daily.      Marland Kitchen losartan (COZAAR) 100 MG tablet Take 1 tablet (100 mg total) by mouth daily.  30 tablet  6  . nitroGLYCERIN (NITROSTAT) 0.4 MG SL tablet Place 0.4 mg under the tongue every 5 (five) minutes as needed for chest pain. For chest pain      . pantoprazole (PROTONIX) 20 MG tablet Take 20 mg by mouth 2 (two) times daily.      . Ticagrelor (BRILINTA) 90 MG TABS tablet Take 90 mg by mouth 2 (two) times daily.      . traMADol (ULTRAM) 50 MG tablet Take 50 mg by mouth every 6 (six) hours as needed. For pain       No current facility-administered medications for this visit.    Past Medical History  Diagnosis Date  . Essential hypertension, benign   . Chronic bronchitis   . GERD (gastroesophageal reflux disease)   . PUD (peptic ulcer disease)   . History of lower GI  bleeding   . Arthritis   . History of gout   . CAD (coronary artery disease)     a. 12/2011 NSTEMI/Cath/PCI LCX (2.25x14 Resolute DES) & D1 (2.25x22 Resolute DES);  b. 01/2012 Cath/PCI: LM 30, LAD 30p, 40-17m, D1 stent ok, 99 in sm branch of diag, LCX patent stent, OM1 20, RCA 95 ost (4.0x12 Promus DES), EF 55%;  c. 04/2012 Lexi Cardiolite  EF 48%, small area of scar @ base/mid inflat wall with mild peri-infarct ischemia.; CABG 12/4  . High cholesterol 12/2011  . Chronic diastolic CHF (congestive heart failure)     a. 02/2012 Echo EF 60-65%, nl wall motion, Gr 1 DD, mod MR  . Pneumonia ~ 2009  . Iron deficiency anemia   . TIA (transient ischemic attack)   . Anxiety   . History of blood transfusion 07/2011; 12/2011; 01/2012 X 2; 04/2012  . Carotid artery disease     a. A999333 LICA, [id number]   . Mitral regurgitation     a. Moderate by echo, 02/2012  . Myocardial infarction   . Ovarian cancer 1992  . Colon cancer 1992  . ESRD (end stage renal disease) on dialysis 12/15/11    a. HD @ [facility]; Mon, Wed, Fri    Past Surgical History  Procedure Laterality Date  . Abdominal hysterectomy  1992  . Appendectomy  06/1990  . Tubal ligation  1980's  . Av fistula placement  07/2009    left upper arm  . Thrombectomy / arteriovenous graft revision  2011    left upper arm  . Colon resection  1992  . Esophagogastroduodenoscopy  01/20/2012    Procedure: ESOPHAGOGASTRODUODENOSCOPY (EGD);  Surgeon: Ladene Artist, MD,FACG;  Location: [facility] ENDOSCOPY;  Service: Endoscopy;  Laterality: N/A;  . Dilation and curettage of uterus    . Coronary angioplasty with stent placement  12/15/11    "2"  . Coronary angioplasty with stent placement  y/2013    "1; makes total of 3" (05/02/2012)  . Coronary artery bypass graft  06/13/2012    Procedure: CORONARY ARTERY BYPASS GRAFTING (CABG);  Surgeon: Grace Isaac, MD;  Location: [facility];  Service: Open Heart Surgery;  Laterality: N/A;  cabg x four;  using left internal mammary  artery, and left leg greater saphenous vein harvested endoscopically  . Intraoperative transesophageal echocardiogram  06/13/2012    Procedure: INTRAOPERATIVE TRANSESOPHAGEAL ECHOCARDIOGRAM;  Surgeon: Grace Isaac, MD;  Location: [facility] OR;  Service: Open Heart Surgery;  Laterality: N/A;  . Colectomy  1992    History   Social History  . Marital Status: Married    Spouse Name: N/A    Number of Children: N/A  . Years of Education: N/A   Occupational History  . Not on file.   Social History Main Topics  . Smoking status: Never Smoker   . Smokeless tobacco: Never Used  . Alcohol Use: No  . Drug Use: No  . Sexually Active: Yes   Other Topics Concern  . Not on file   Social History Narrative   Lives in [place], [place] with husband.  Dialysis pt - [facility].    Family History  Problem Relation Age of Onset  . Other      noncontributory for early CAD    ROS: no nausea, vomiting; no fever, chills; no melena, hematochezia; no claudication  PHYSICAL EXAM: BP 148/78  Pulse 89  Ht 5\' 1"  (1.549 m)  Wt 138 lb 9.6 oz (62.869 kg)  BMI 26.2 kg/m2  SpO2 97% GENERAL: 72 year old female; NAD  HEENT: NCAT, PERRLA, EOMI; sclera clear; no xanthelasma  NECK: no JVD; no TM  LUNGS: CTA bilaterally  CARDIAC: RRR (S1, S2); no significant murmurs; no rubs or gallops  ABDOMEN: soft, non-tender; intact BS  EXTREMETIES: Trace peripheral edema  SKIN: warm/dry; no obvious rash/lesions  MUSCULOSKELETAL: Well-healed midline incision  NEURO: no focal deficit; NL affect    EKG:   ASSESSMENT & PLAN:  Hx of CABG Quiescent on current medication regimen.  Essential hypertension, benign I advised her to reestablish with Dr. Nadara Mustard, her primary M.D., regarding active management of her HTN. She states today that he had initially placed her on labetalol, which reportedly was quite effective in managing her blood pressure.  HLD (hyperlipidemia) Will reassess lipid status with a FLP. Recommend target LDL  70 or less, if feasible. Continue current low-dose Lipitor, pending review of results.    Gene Serpe, PAC

## 2012-09-20 NOTE — Patient Instructions (Signed)
   Follow up with family MD Nadara Mustard) for continued management of hypertension Continue all current medications. Labs for FLP, LFT  Office will contact with results Your physician wants you to follow up in: 6 months.  You will receive a reminder letter in the mail one-two months in advance.  If you don't receive a letter, please call our office to schedule the follow up appointment

## 2012-09-30 ENCOUNTER — Emergency Department (HOSPITAL_COMMUNITY): Payer: Medicare Other

## 2012-09-30 ENCOUNTER — Other Ambulatory Visit: Payer: Self-pay

## 2012-09-30 ENCOUNTER — Encounter (HOSPITAL_COMMUNITY): Payer: Self-pay

## 2012-09-30 ENCOUNTER — Emergency Department (HOSPITAL_COMMUNITY)
Admission: EM | Admit: 2012-09-30 | Discharge: 2012-09-30 | Disposition: A | Discharge status: Home / Self Care | Payer: Medicare Other | Attending: Emergency Medicine | Admitting: Emergency Medicine

## 2012-09-30 DIAGNOSIS — D509 Iron deficiency anemia, unspecified: Secondary | ICD-10-CM | POA: Insufficient documentation

## 2012-09-30 DIAGNOSIS — Z951 Presence of aortocoronary bypass graft: Secondary | ICD-10-CM | POA: Insufficient documentation

## 2012-09-30 DIAGNOSIS — E7800 Pure hypercholesterolemia, unspecified: Secondary | ICD-10-CM | POA: Insufficient documentation

## 2012-09-30 DIAGNOSIS — Z8639 Personal history of other endocrine, nutritional and metabolic disease: Secondary | ICD-10-CM | POA: Insufficient documentation

## 2012-09-30 DIAGNOSIS — Z8701 Personal history of pneumonia (recurrent): Secondary | ICD-10-CM | POA: Insufficient documentation

## 2012-09-30 DIAGNOSIS — Z8543 Personal history of malignant neoplasm of ovary: Secondary | ICD-10-CM | POA: Insufficient documentation

## 2012-09-30 DIAGNOSIS — Z992 Dependence on renal dialysis: Secondary | ICD-10-CM | POA: Insufficient documentation

## 2012-09-30 DIAGNOSIS — Z85038 Personal history of other malignant neoplasm of large intestine: Secondary | ICD-10-CM | POA: Insufficient documentation

## 2012-09-30 DIAGNOSIS — I5032 Chronic diastolic (congestive) heart failure: Secondary | ICD-10-CM | POA: Insufficient documentation

## 2012-09-30 DIAGNOSIS — I252 Old myocardial infarction: Secondary | ICD-10-CM | POA: Insufficient documentation

## 2012-09-30 DIAGNOSIS — Z8719 Personal history of other diseases of the digestive system: Secondary | ICD-10-CM | POA: Insufficient documentation

## 2012-09-30 DIAGNOSIS — Z8739 Personal history of other diseases of the musculoskeletal system and connective tissue: Secondary | ICD-10-CM | POA: Insufficient documentation

## 2012-09-30 DIAGNOSIS — Z7982 Long term (current) use of aspirin: Secondary | ICD-10-CM | POA: Insufficient documentation

## 2012-09-30 DIAGNOSIS — Z8711 Personal history of peptic ulcer disease: Secondary | ICD-10-CM | POA: Insufficient documentation

## 2012-09-30 DIAGNOSIS — I120 Hypertensive chronic kidney disease with stage 5 chronic kidney disease or end stage renal disease: Secondary | ICD-10-CM | POA: Insufficient documentation

## 2012-09-30 DIAGNOSIS — K219 Gastro-esophageal reflux disease without esophagitis: Secondary | ICD-10-CM | POA: Insufficient documentation

## 2012-09-30 DIAGNOSIS — Z79899 Other long term (current) drug therapy: Secondary | ICD-10-CM | POA: Insufficient documentation

## 2012-09-30 DIAGNOSIS — Z8679 Personal history of other diseases of the circulatory system: Secondary | ICD-10-CM | POA: Insufficient documentation

## 2012-09-30 DIAGNOSIS — Z862 Personal history of diseases of the blood and blood-forming organs and certain disorders involving the immune mechanism: Secondary | ICD-10-CM | POA: Insufficient documentation

## 2012-09-30 DIAGNOSIS — N186 End stage renal disease: Secondary | ICD-10-CM | POA: Insufficient documentation

## 2012-09-30 DIAGNOSIS — R079 Chest pain, unspecified: Secondary | ICD-10-CM

## 2012-09-30 DIAGNOSIS — IMO0002 Reserved for concepts with insufficient information to code with codable children: Secondary | ICD-10-CM | POA: Insufficient documentation

## 2012-09-30 DIAGNOSIS — I2510 Atherosclerotic heart disease of native coronary artery without angina pectoris: Secondary | ICD-10-CM | POA: Insufficient documentation

## 2012-09-30 DIAGNOSIS — F411 Generalized anxiety disorder: Secondary | ICD-10-CM | POA: Insufficient documentation

## 2012-09-30 DIAGNOSIS — Z8709 Personal history of other diseases of the respiratory system: Secondary | ICD-10-CM | POA: Insufficient documentation

## 2012-09-30 DIAGNOSIS — Z8673 Personal history of transient ischemic attack (TIA), and cerebral infarction without residual deficits: Secondary | ICD-10-CM | POA: Insufficient documentation

## 2012-09-30 LAB — BASIC METABOLIC PANEL
BUN: 46 mg/dL — ABNORMAL HIGH (ref 6–23)
Chloride: 94 mEq/L — ABNORMAL LOW (ref 96–112)
Creatinine, Ser: 8.37 mg/dL — ABNORMAL HIGH (ref 0.50–1.10)
GFR calc Af Amer: 5 mL/min — ABNORMAL LOW (ref >90)
Glucose, Bld: 112 mg/dL — ABNORMAL HIGH (ref 70–99)

## 2012-09-30 LAB — CBC
HCT: 26.8 % — ABNORMAL LOW (ref 36.0–46.0)
Hemoglobin: 8.6 g/dL — ABNORMAL LOW (ref 12.0–15.0)
MCHC: 32.1 g/dL (ref 30.0–36.0)
MCV: 98.5 fL (ref 78.0–100.0)
RDW: 21.1 % — ABNORMAL HIGH (ref 11.5–15.5)

## 2012-09-30 LAB — POCT I-STAT TROPONIN I: Troponin i, poc: 0.01 ng/mL (ref 0.00–0.08)

## 2012-09-30 IMAGING — CR DG CHEST 1V PORT
1 series · 1 of 1 positions shown · non-contrast
Comparison: [DATE]

CLINICAL DATA: Chest pain

PORTABLE CHEST - 1 VIEW

[AP]
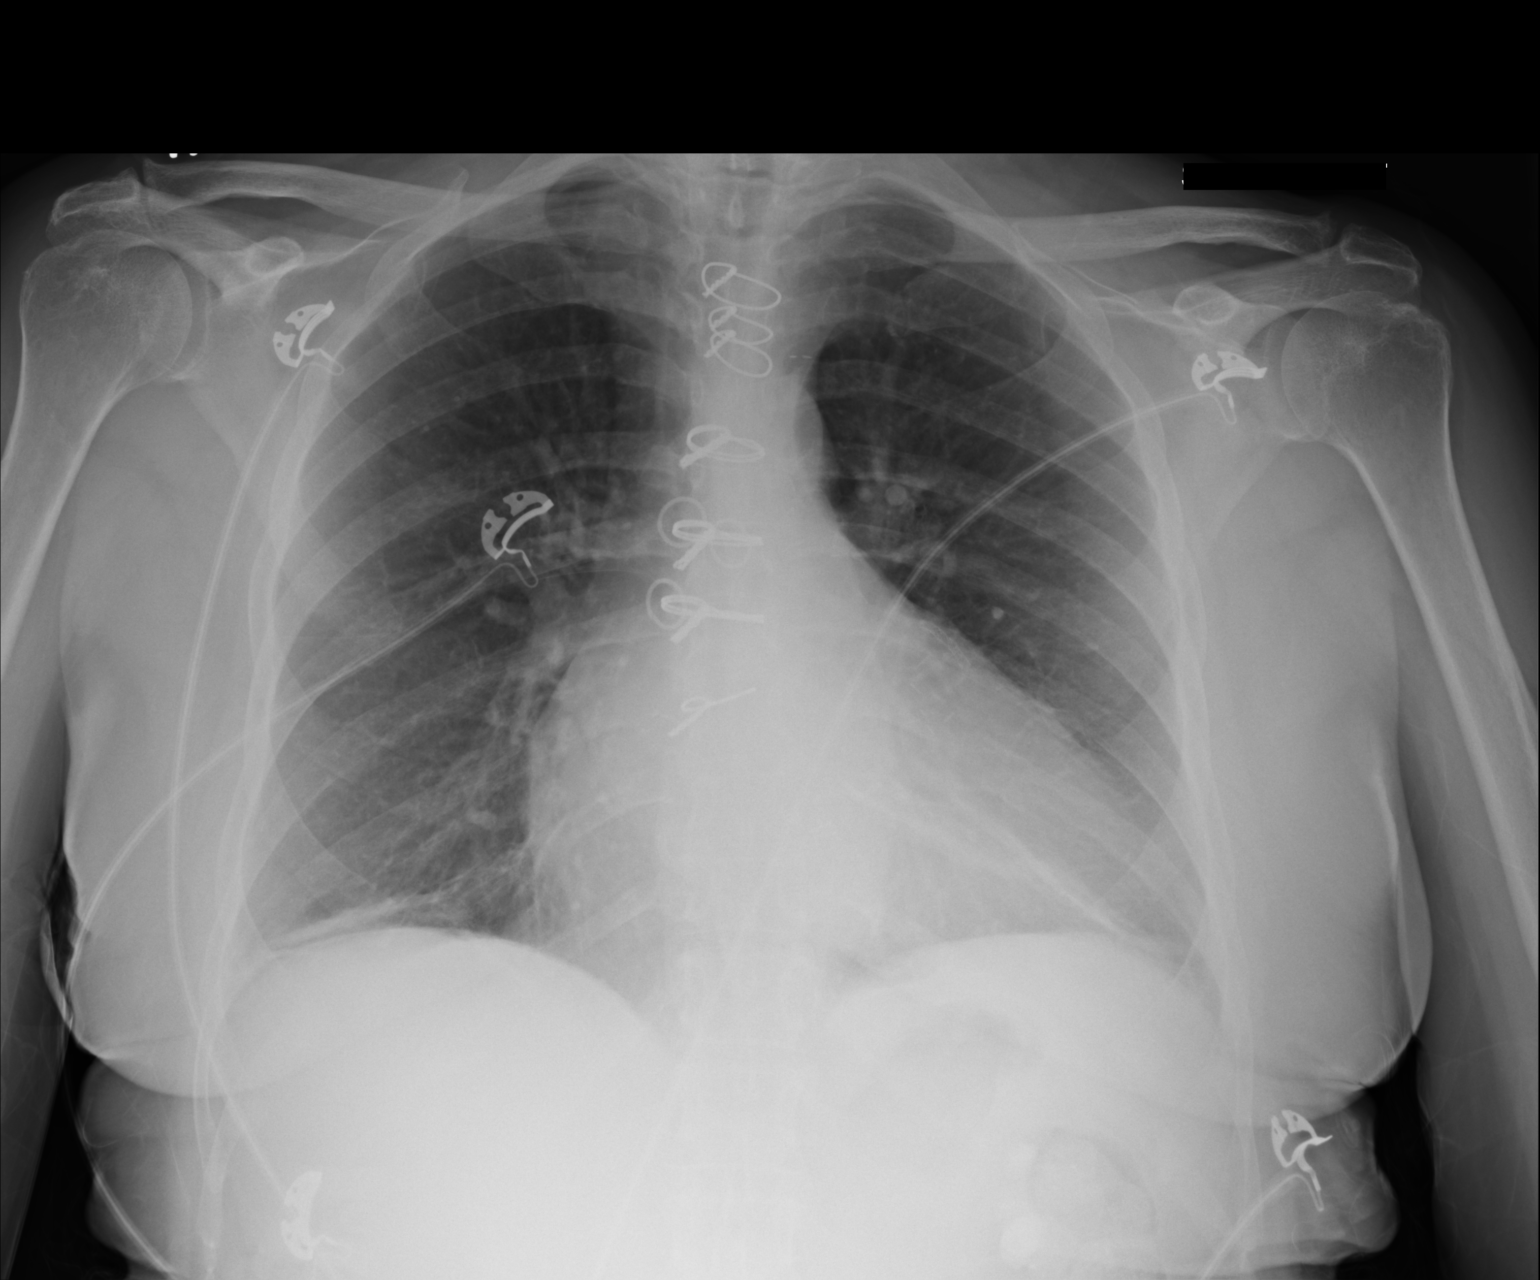

[1 of 1 positions shown; findings below may reference images not displayed]

FINDINGS: Cardiomediastinal silhouette is stable.  No acute
infiltrate or pulmonary edema.  Status post CABG.  Stable bilateral
basilar atelectasis or scarring.
IMPRESSION: No active disease.  No significant change.

## 2012-09-30 MED ORDER — MORPHINE SULFATE 4 MG/ML IJ SOLN
2.0000 mg | Freq: Once | INTRAMUSCULAR | Status: AC
  Administered 2012-09-30: 2 mg via INTRAVENOUS
  Filled 2012-09-30: qty 1

## 2012-09-30 NOTE — ED Notes (Signed)
IV team paged.  

## 2012-09-30 NOTE — ED Provider Notes (Signed)
History     CSN: [id number]  Arrival date & time 09/30/12  [id number]   First MD Initiated Contact with Patient 09/30/12 1000      Chief Complaint  Patient presents with  . Chest Pain    (Consider location/radiation/quality/duration/timing/severity/associated sxs/prior treatment) HPI Comments: 72 year old female who has a history of hypertension, end-stage renal disease as well as coronary artery bypass grafting that was performed approximately 3 months ago who presents with recurrent left-sided chest pain. She states that she has had some intermittent pain since the surgery, in February she was admitted to the hospital and had negative cardiac enzymes, thought to be atypical chest wall pain and was discharged. She has done well until 2 days ago when she developed left-sided pain, took a nitroglycerin pill which relieved her pain immediately. This morning she developed recurrent left-sided chest pain which she states is a sharp and pinching pain, worse with palpation of the chest, worse with taking a deep breath which also improved with nitroglycerin. She still has some persistent pain on the left side. She denies any swelling of her legs except for the chronic mild swelling which she is out of her left lower extremity since the vein harvesting of the lower extremity.  She has no shortness of breath, no fever, no chills, she does have a slight intermittent cough.  Patient is a 72 y.o. female presenting with chest pain. The history is provided by the patient, the spouse and medical records.  Chest Pain   Past Medical History  Diagnosis Date  . Essential hypertension, benign   . Chronic bronchitis   . GERD (gastroesophageal reflux disease)   . PUD (peptic ulcer disease)   . History of lower GI bleeding   . Arthritis   . History of gout   . CAD (coronary artery disease)     a. 12/2011 NSTEMI/Cath/PCI LCX (2.25x14 Resolute DES) & D1 (2.25x22 Resolute DES);  b. 01/2012 Cath/PCI: LM 30, LAD 30p,  40-63m, D1 stent ok, 99 in sm branch of diag, LCX patent stent, OM1 20, RCA 95 ost (4.0x12 Promus DES), EF 55%;  c. 04/2012 Lexi Cardiolite  EF 48%, small area of scar @ base/mid inflat wall with mild peri-infarct ischemia.; CABG 12/4  . High cholesterol 12/2011  . Chronic diastolic CHF (congestive heart failure)     a. 02/2012 Echo EF 60-65%, nl wall motion, Gr 1 DD, mod MR  . Pneumonia ~ 2009  . Iron deficiency anemia   . TIA (transient ischemic attack)   . Anxiety   . History of blood transfusion 07/2011; 12/2011; 01/2012 X 2; 04/2012  . Carotid artery disease     a. [id number] LICA, [id number]   . Mitral regurgitation     a. Moderate by echo, 02/2012  . Myocardial infarction   . Ovarian cancer 1992  . Colon cancer 1992  . ESRD (end stage renal disease) on dialysis 12/15/11    a. HD @ [facility]; Mon, Wed, Fri    Past Surgical History  Procedure Laterality Date  . Abdominal hysterectomy  1992  . Appendectomy  06/1990  . Tubal ligation  1980's  . Av fistula placement  07/2009    left upper arm  . Thrombectomy / arteriovenous graft revision  2011    left upper arm  . Colon resection  1992  . Esophagogastroduodenoscopy  01/20/2012    Procedure: ESOPHAGOGASTRODUODENOSCOPY (EGD);  Surgeon: Ladene Artist, MD,FACG;  Location: [facility] ENDOSCOPY;  Service: Endoscopy;  Laterality: N/A;  .  Dilation and curettage of uterus    . Coronary angioplasty with stent placement  12/15/11    "2"  . Coronary angioplasty with stent placement  y/2013    "1; makes total of 3" (05/02/2012)  . Coronary artery bypass graft  06/13/2012    Procedure: CORONARY ARTERY BYPASS GRAFTING (CABG);  Surgeon: Grace Isaac, MD;  Location: [place];  Service: Open Heart Surgery;  Laterality: N/A;  cabg x four;  using left internal mammary artery, and left leg greater saphenous vein harvested endoscopically  . Intraoperative transesophageal echocardiogram  06/13/2012    Procedure: INTRAOPERATIVE TRANSESOPHAGEAL ECHOCARDIOGRAM;  Surgeon:  Grace Isaac, MD;  Location: [place];  Service: Open Heart Surgery;  Laterality: N/A;  . Colectomy  1992    Family History  Problem Relation Age of Onset  . Other      noncontributory for early CAD    History  Substance Use Topics  . Smoking status: Never Smoker   . Smokeless tobacco: Never Used  . Alcohol Use: No    OB History   Grav Para Term Preterm Abortions TAB SAB Ect Mult Living                  Review of Systems  Cardiovascular: Positive for chest pain.  All other systems reviewed and are negative.    Allergies  Aspirin; Contrast media; Iron; Macrodantin; Penicillins; Plavix; Bactrim; Sulfa antibiotics; and Venofer  Home Medications   Current Outpatient Rx  Name  Route  Sig  Dispense  Refill  . albuterol (PROVENTIL HFA;VENTOLIN HFA) 108 (90 BASE) MCG/ACT inhaler   Inhalation   Inhale 2 puffs into the lungs every 6 (six) hours as needed for wheezing.         Marland Kitchen ALPRAZolam (XANAX) 0.25 MG tablet   Oral   Take 0.25 mg by mouth 3 (three) times daily as needed for anxiety.         Marland Kitchen amLODipine (NORVASC) 10 MG tablet   Oral   Take 10 mg by mouth daily.         Marland Kitchen aspirin 81 MG chewable tablet   Oral   Chew 81 mg by mouth daily.         Marland Kitchen atorvastatin (LIPITOR) 20 MG tablet   Oral   Take 20 mg by mouth every evening.          . cloNIDine (CATAPRES) 0.2 MG tablet   Oral   Take 0.2 mg by mouth daily.         . fluticasone (FLONASE) 50 MCG/ACT nasal spray   Nasal   Place 2 sprays into the nose daily.         . folic acid (FOLVITE) 1 MG tablet   Oral   Take 1 tablet (1 mg total) by mouth daily.   30 tablet   6   . folic acid-vitamin b complex-vitamin c-selenium-zinc (DIALYVITE) 3 MG TABS   Oral   Take 1 tablet by mouth daily.         . hydrALAZINE (APRESOLINE) 25 MG tablet   Oral   Take 25 mg by mouth 2 (two) times daily.         . iron polysaccharides (NIFEREX) 150 MG capsule   Oral   Take 150 mg by mouth daily.          Marland Kitchen lanthanum (FOSRENOL) 1000 MG chewable tablet   Oral   Chew 1,000 mg by mouth 3 (three) times daily after meals.         Marland Kitchen  lidocaine-prilocaine (EMLA) cream   Topical   Apply 1 application topically as needed. For pain         . loratadine (CLARITIN) 10 MG tablet   Oral   Take 10 mg by mouth daily. For allergies.         Marland Kitchen losartan (COZAAR) 100 MG tablet   Oral   Take 1 tablet (100 mg total) by mouth daily.   30 tablet   6   . nitroGLYCERIN (NITROSTAT) 0.4 MG SL tablet   Sublingual   Place 0.4 mg under the tongue every 5 (five) minutes as needed for chest pain. For chest pain         . pantoprazole (PROTONIX) 40 MG tablet   Oral   Take 40 mg by mouth daily.         . Ticagrelor (BRILINTA) 90 MG TABS tablet   Oral   Take 90 mg by mouth 2 (two) times daily.         . traMADol (ULTRAM) 50 MG tablet   Oral   Take 50 mg by mouth every 6 (six) hours as needed. For pain           BP 154/68  Pulse 76  Temp(Src) 98 F (36.7 C) (Oral)  Resp 20  SpO2 99%  Physical Exam  Nursing note and vitals reviewed. Constitutional: She appears well-developed and well-nourished. No distress.  HENT:  Head: Normocephalic and atraumatic.  Mouth/Throat: Oropharynx is clear and moist. No oropharyngeal exudate.  Eyes: Conjunctivae and EOM are normal. Pupils are equal, round, and reactive to light. Right eye exhibits no discharge. Left eye exhibits no discharge. No scleral icterus.  Neck: Normal range of motion. Neck supple. No JVD present. No thyromegaly present.  Cardiovascular: Normal rate, regular rhythm, normal heart sounds and intact distal pulses.  Exam reveals no gallop and no friction rub.   No murmur heard. Pulmonary/Chest: Effort normal and breath sounds normal. No respiratory distress. She has no wheezes. She has no rales. She exhibits tenderness ( Left lower parasternal chest wall tenderness).  Abdominal: Soft. Bowel sounds are normal. She exhibits no distension  and no mass. There is no tenderness.  Musculoskeletal: Normal range of motion. She exhibits no tenderness.  Lymphadenopathy:    She has no cervical adenopathy.  Neurological: She is alert. Coordination normal.  Skin: Skin is warm and dry. No rash noted. No erythema.  Incision is clean dry and intact, no redness, no swelling, no drainage  Psychiatric: She has a normal mood and affect. Her behavior is normal.    ED Course  Procedures (including critical care time)  Labs Reviewed  CBC - Abnormal; Notable for the following:    RBC 2.72 (*)    Hemoglobin 8.6 (*)    HCT 26.8 (*)    RDW 21.1 (*)    All other components within normal limits  BASIC METABOLIC PANEL - Abnormal; Notable for the following:    Chloride 94 (*)    Glucose, Bld 112 (*)    BUN 46 (*)    Creatinine, Ser 8.37 (*)    GFR calc non Af Amer 4 (*)    GFR calc Af Amer 5 (*)    All other components within normal limits  POCT I-STAT TROPONIN I  POCT I-STAT TROPONIN I   Dg Chest Port 1 View  09/30/2012  *RADIOLOGY REPORT*  Clinical Data: Chest pain  PORTABLE CHEST - 1 VIEW  Comparison: 09/11/2012  Findings: Cardiomediastinal silhouette is stable.  No acute infiltrate or pulmonary edema.  Status post CABG.  Stable bilateral basilar atelectasis or scarring.  IMPRESSION: No active disease.  No significant change.   Original Report Authenticated By: Lahoma Crocker, M.D.      1. Chest pain       MDM  The patient has an EKG which is unchanged from prior EKGs, there are some T wave inversions in the lateral leads but of note the patient has left ventricular hypertrophy and this would not be uncommon. Compared to her prior EKG 09/05/2012, there are no significant changes.  ED ECG REPORT  I personally interpreted this EKG   Date: 09/30/2012 0952 AM  Rate: 77  Rhythm: normal sinus rhythm  QRS Axis: left  Intervals: normal  ST/T Wave abnormalities: normal other than TWI in lateral leads  Conduction Disutrbances:none   Narrative Interpretation:   Old EKG Reviewed: unchanged  Probably has some element of chest wall pain, she has had a dry cough which has been plaguing her since her surgery, chest x-ray ordered, labs, touch of morphine, vital signs appear normal.  ED ECG REPORT  I personally interpreted this EKG   Date: 09/30/2012  1602 PM  Rate: 77  Rhythm: normal sinus rhythm  QRS Axis: left  Intervals: normal  ST/T Wave abnormalities: nonspecific T wave changes  Conduction Disutrbances:none  Narrative Interpretation:   Old EKG Reviewed: unchanged c/w with earlier EKG today  Patient reexamined, has ongoing mild pain with palpation of the chest, doubt cardiac source given 2 sets of negative cardiac markers and unchanged EKG. Discussed with cardiologist Dr. Lia Foyer who agrees with followup in the office.      Johnna Acosta, MD 09/30/12 [phone]

## 2012-09-30 NOTE — ED Notes (Signed)
IV team at bedside 

## 2012-09-30 NOTE — ED Notes (Signed)
Pt states that on Friday she had some CP and took a SL Nitro and pain was relieved.  No other issues until she woke this morning with L sided CP described as sore and pinching rated 9/10.  Pt took 1 SL Nitro this morning with no relief.

## 2012-10-02 ENCOUNTER — Other Ambulatory Visit: Payer: Self-pay | Admitting: Cardiology

## 2012-10-04 ENCOUNTER — Ambulatory Visit (INDEPENDENT_AMBULATORY_CARE_PROVIDER_SITE_OTHER): Payer: Medicare Other | Admitting: Physician Assistant

## 2012-10-04 ENCOUNTER — Encounter: Payer: Self-pay | Admitting: Physician Assistant

## 2012-10-04 VITALS — BP 185/69 | HR 78 | Ht 61.0 in | Wt 138.0 lb

## 2012-10-04 DIAGNOSIS — R079 Chest pain, unspecified: Secondary | ICD-10-CM

## 2012-10-04 DIAGNOSIS — I10 Essential (primary) hypertension: Secondary | ICD-10-CM

## 2012-10-04 MED ORDER — NITROGLYCERIN 0.4 MG SL SUBL: 0.4000 mg | SUBLINGUAL_TABLET | SUBLINGUAL | Status: DC | PRN

## 2012-10-04 NOTE — Progress Notes (Signed)
Primary Cardiologist: Johnny Bridge, MD   HPI: Patient seen today as an add-on today in follow up to recent ER visits, first at [facility] and then here at [facility] the same day, March 23. She initially complained of CP at [facility], and troponins were negative. She then presented at [facility] with a BP of 214/112, requiring treatment with 20 mg IV labetalol. Cardiac markers were not drawn.  Patient has not had any recurrent chest pain.  Allergies  Allergen Reactions  . Aspirin Other (See Comments)    Mess up her stomach; "makes my bowels have blood in them". Takes 81 mg EC Aspirin   . Contrast Media (Iodinated Diagnostic Agents) Itching  . Iron Itching and Other (See Comments)    "they gave me iron in dialysis; had to give me Benadryl cause I had to have the iron" (05/02/2012)  . Macrodantin (Nitrofurantoin Macrocrystal) Other (See Comments)    "broke me out in big old knots all over my body; had to go to ER"  . Penicillins Other (See Comments)    "makes me real weak when I take it; like I'll pass out"  . Plavix (Clopidogrel Bisulfate) Rash  . Bactrim (Sulfamethoxazole W-Trimethoprim) Rash  . Sulfa Antibiotics Rash  . Venofer (Ferric Oxide) Itching    Patient reports using Benadryl prior to doses as [facility]    Current Outpatient Prescriptions  Medication Sig Dispense Refill  . albuterol (PROVENTIL HFA;VENTOLIN HFA) 108 (90 BASE) MCG/ACT inhaler Inhale 2 puffs into the lungs every 6 (six) hours as needed for wheezing.      Marland Kitchen ALPRAZolam (XANAX) 0.5 MG tablet Take 0.5 mg by mouth 3 (three) times daily.      Marland Kitchen amLODipine (NORVASC) 10 MG tablet Take 10 mg by mouth daily.      Marland Kitchen aspirin 81 MG chewable tablet Chew 81 mg by mouth daily.      Marland Kitchen atorvastatin (LIPITOR) 20 MG tablet TAKE 1 BY MOUTH DAILY AT   6PM                        *CHRISTOPHER BERGE, NP*  30 tablet  3  . azithromycin (ZITHROMAX) 250 MG tablet Take 250 mg by mouth daily. Start 10-04-2012      . cloNIDine (CATAPRES) 0.2 MG tablet Take 0.2 mg  by mouth daily.      . fluticasone (FLONASE) 50 MCG/ACT nasal spray Place 2 sprays into the nose daily.      . folic acid (FOLVITE) 1 MG tablet Take 1 tablet (1 mg total) by mouth daily.  30 tablet  6  . folic acid-vitamin b complex-vitamin c-selenium-zinc (DIALYVITE) 3 MG TABS Take 1 tablet by mouth daily.      . hydrALAZINE (APRESOLINE) 25 MG tablet Take 25 mg by mouth 2 (two) times daily.      . iron polysaccharides (NIFEREX) 150 MG capsule Take 150 mg by mouth daily.      Marland Kitchen lanthanum (FOSRENOL) 1000 MG chewable tablet Chew 1,000 mg by mouth 3 (three) times daily after meals.      . lidocaine-prilocaine (EMLA) cream Apply 1 application topically as needed. For pain      . loratadine (CLARITIN) 10 MG tablet Take 10 mg by mouth daily. For allergies.      Marland Kitchen losartan (COZAAR) 100 MG tablet Take 1 tablet (100 mg total) by mouth daily.  30 tablet  6  . nitroGLYCERIN (NITROSTAT) 0.4 MG SL tablet Place 0.4 mg  under the tongue every 5 (five) minutes as needed for chest pain. For chest pain      . pantoprazole (PROTONIX) 40 MG tablet Take 40 mg by mouth daily.      . Ticagrelor (BRILINTA) 90 MG TABS tablet Take 90 mg by mouth 2 (two) times daily.      Marland Kitchen tobramycin-dexamethasone (TOBRADEX) ophthalmic solution Place 1 drop into the left eye 4 (four) times daily.       . traMADol (ULTRAM) 50 MG tablet Take 50 mg by mouth every 6 (six) hours as needed. For pain       No current facility-administered medications for this visit.    Past Medical History  Diagnosis Date  . Essential hypertension, benign   . Chronic bronchitis   . GERD (gastroesophageal reflux disease)   . PUD (peptic ulcer disease)   . History of lower GI bleeding   . Arthritis   . History of gout   . CAD (coronary artery disease)     a. 12/2011 NSTEMI/Cath/PCI LCX (2.25x14 Resolute DES) & D1 (2.25x22 Resolute DES);  b. 01/2012 Cath/PCI: LM 30, LAD 30p, 40-79m, D1 stent ok, 99 in sm branch of diag, LCX patent stent, OM1 20, RCA 95 ost  (4.0x12 Promus DES), EF 55%;  c. 04/2012 Lexi Cardiolite  EF 48%, small area of scar @ base/mid inflat wall with mild peri-infarct ischemia.; CABG 12/4  . High cholesterol 12/2011  . Chronic diastolic CHF (congestive heart failure)     a. 02/2012 Echo EF 60-65%, nl wall motion, Gr 1 DD, mod MR  . Pneumonia ~ 2009  . Iron deficiency anemia   . TIA (transient ischemic attack)   . Anxiety   . History of blood transfusion 07/2011; 12/2011; 01/2012 X 2; 04/2012  . Carotid artery disease     a. A999333 LICA, [id number]   . Mitral regurgitation     a. Moderate by echo, 02/2012  . Myocardial infarction   . Ovarian cancer 1992  . Colon cancer 1992  . ESRD (end stage renal disease) on dialysis 12/15/11    a. HD @ [facility]; Mon, Wed, Fri    Past Surgical History  Procedure Laterality Date  . Abdominal hysterectomy  1992  . Appendectomy  06/1990  . Tubal ligation  1980's  . Av fistula placement  07/2009    left upper arm  . Thrombectomy / arteriovenous graft revision  2011    left upper arm  . Colon resection  1992  . Esophagogastroduodenoscopy  01/20/2012    Procedure: ESOPHAGOGASTRODUODENOSCOPY (EGD);  Surgeon: Ladene Artist, MD,FACG;  Location: [facility] ENDOSCOPY;  Service: Endoscopy;  Laterality: N/A;  . Dilation and curettage of uterus    . Coronary angioplasty with stent placement  12/15/11    "2"  . Coronary angioplasty with stent placement  y/2013    "1; makes total of 3" (05/02/2012)  . Coronary artery bypass graft  06/13/2012    Procedure: CORONARY ARTERY BYPASS GRAFTING (CABG);  Surgeon: Grace Isaac, MD;  Location: [place];  Service: Open Heart Surgery;  Laterality: N/A;  cabg x four;  using left internal mammary artery, and left leg greater saphenous vein harvested endoscopically  . Intraoperative transesophageal echocardiogram  06/13/2012    Procedure: INTRAOPERATIVE TRANSESOPHAGEAL ECHOCARDIOGRAM;  Surgeon: Grace Isaac, MD;  Location: [place];  Service: Open Heart Surgery;  Laterality:  N/A;  . Colectomy  1992    History   Social History  . Marital Status: Married  Spouse Name: N/A    Number of Children: N/A  . Years of Education: N/A   Occupational History  . Not on file.   Social History Main Topics  . Smoking status: Never Smoker   . Smokeless tobacco: Never Used  . Alcohol Use: No  . Drug Use: No  . Sexually Active: Yes   Other Topics Concern  . Not on file   Social History Narrative   Lives in [place], [place] with husband.  Dialysis pt - [facility].    Family History  Problem Relation Age of Onset  . Other      noncontributory for early CAD    ROS: no nausea, vomiting; no fever, chills; no melena, hematochezia; no claudication  PHYSICAL EXAM: BP 185/69  Pulse 78  Ht 5\' 1"  (1.549 m)  Wt 138 lb (62.596 kg)  BMI 26.09 kg/m2 GENERAL: 72 year old female; NAD  HEENT: NCAT, PERRLA, EOMI; sclera clear; no xanthelasma  NECK: no JVD; no TM  LUNGS: CTA bilaterally  CARDIAC: RRR (S1, S2); no significant murmurs; no rubs or gallops  ABDOMEN: soft, non-tender; intact BS  EXTREMETIES: Trace peripheral edema  SKIN: warm/dry; no obvious rash/lesions  MUSCULOSKELETAL: Well-healed midline incision  NEURO: no focal deficit; NL affect    EKG:    ASSESSMENT & PLAN:  Chest pain No further workup indicated. Recent presentation with atypical chest pain and negative cardiac markers. Followup with Dr. Domenic Polite, as previously scheduled.  Essential hypertension, benign I reemphasized to her the primary importance of good BP control. She is in the process of following up with Dr. Lowanda Foster, who recently conferred with Dr. Nadara Mustard regarding her antihypertensive regimen. The patient maintains that she had much better control when she was on labetalol, prior to undergoing CABG. I will defer to Dr. Lowanda Foster as to whether or not to resume this medication, for which I see no contraindication from a cardiac standpoint.    Gene Lou Loewe, PAC

## 2012-10-04 NOTE — Patient Instructions (Addendum)
Continue all current medications. Nitroglycerin as needed for severe chest pain only - refill sent to pharm Your physician wants you to follow up in: 6 months.  You will receive a reminder letter in the mail one-two months in advance.  If you don't receive a letter, please call our office to schedule the follow up appointment

## 2012-10-04 NOTE — Assessment & Plan Note (Signed)
No further workup indicated. Recent presentation with atypical chest pain and negative cardiac markers. Followup with Dr. Domenic Polite, as previously scheduled.

## 2012-10-04 NOTE — Assessment & Plan Note (Signed)
I reemphasized to her the primary importance of good BP control. She is in the process of following up with Dr. Lowanda Foster, who recently conferred with Dr. Nadara Mustard regarding her antihypertensive regimen. The patient maintains that she had much better control when she was on labetalol, prior to undergoing CABG. I will defer to Dr. Lowanda Foster as to whether or not to resume this medication, for which I see no contraindication from a cardiac standpoint.

## 2012-10-12 ENCOUNTER — Telehealth: Payer: Self-pay | Admitting: *Deleted

## 2012-10-12 NOTE — Telephone Encounter (Signed)
Message copied by Merlene Laughter on Fri Oct 12, 2012  4:08 PM ------      Message from: MCDOWELL, Aloha Gell      Created: Tue Oct 09, 2012  8:59 AM       Normal LFTs, LDL 52. Good cholesterol control. ------

## 2012-10-12 NOTE — Telephone Encounter (Signed)
Patient informed. 

## 2012-10-24 ENCOUNTER — Telehealth: Payer: Self-pay

## 2012-10-24 MED ORDER — PANTOPRAZOLE SODIUM 40 MG PO TBEC: 40.0000 mg | DELAYED_RELEASE_TABLET | Freq: Every day | ORAL | Status: DC

## 2012-10-24 NOTE — Telephone Encounter (Signed)
[facility] called to rqst authorization of pt protonix. completed 90 r-1

## 2012-10-29 ENCOUNTER — Ambulatory Visit: Payer: Medicare Other | Admitting: Cardiology

## 2012-11-03 ENCOUNTER — Other Ambulatory Visit: Payer: Self-pay | Admitting: Physician Assistant

## 2012-12-07 ENCOUNTER — Other Ambulatory Visit: Payer: Self-pay | Admitting: *Deleted

## 2012-12-12 ENCOUNTER — Ambulatory Visit (INDEPENDENT_AMBULATORY_CARE_PROVIDER_SITE_OTHER): Payer: Medicare Other | Admitting: Physician Assistant

## 2012-12-12 ENCOUNTER — Encounter: Payer: Self-pay | Admitting: Physician Assistant

## 2012-12-12 VITALS — BP 175/69 | HR 66 | Ht 61.0 in | Wt 137.4 lb

## 2012-12-12 DIAGNOSIS — I10 Essential (primary) hypertension: Secondary | ICD-10-CM

## 2012-12-12 NOTE — Progress Notes (Signed)
Primary Cardiologist: Johnny Bridge, MD    HPI: Patient seen as an add-on for evaluation of elevated BP.  She was seen here in [facility] ED on May 29th with complaint of elevated BP: 257/109. She was treated with a total of 20 mg IV labetalol, 0.2 mg clonidine, and 2 mg of Ativan, with instructions to followup with her primary M.D. as soon as possible. By time of discharge from the ED, her BP had improved to 197/91.  She reports today that she was seen by Dr. Pleas Koch in the office 2 days ago, at which time he increased her clonidine to 0.3 mg daily. Although her BP is much improved today, she suggests continued labile readings at home, as high as in the A999333 systolic range, particularly during early morning hours.  Allergies  Allergen Reactions  . Aspirin Other (See Comments)    Mess up her stomach; "makes my bowels have blood in them". Takes 81 mg EC Aspirin   . Contrast Media (Iodinated Diagnostic Agents) Itching  . Iron Itching and Other (See Comments)    "they gave me iron in dialysis; had to give me Benadryl cause I had to have the iron" (05/02/2012)  . Macrodantin (Nitrofurantoin Macrocrystal) Other (See Comments)    "broke me out in big old knots all over my body; had to go to ER"  . Penicillins Other (See Comments)    "makes me real weak when I take it; like I'll pass out"  . Plavix (Clopidogrel Bisulfate) Rash  . Bactrim (Sulfamethoxazole W-Trimethoprim) Rash  . Sulfa Antibiotics Rash  . Venofer (Ferric Oxide) Itching    Patient reports using Benadryl prior to doses as [facility]    Current Outpatient Prescriptions  Medication Sig Dispense Refill  . albuterol (PROVENTIL HFA;VENTOLIN HFA) 108 (90 BASE) MCG/ACT inhaler Inhale 2 puffs into the lungs every 6 (six) hours as needed for wheezing.      Marland Kitchen ALPRAZolam (XANAX) 0.5 MG tablet Take 0.5 mg by mouth 3 (three) times daily.      Marland Kitchen amLODipine (NORVASC) 10 MG tablet Take 10 mg by mouth daily.      Marland Kitchen aspirin 81 MG chewable tablet  Chew 81 mg by mouth daily.      Marland Kitchen atorvastatin (LIPITOR) 20 MG tablet TAKE 1 BY MOUTH DAILY AT   6PM                        *CHRISTOPHER BERGE, NP*  30 tablet  3  . cloNIDine (CATAPRES) 0.3 MG tablet Take 0.3 mg by mouth daily.      . fluticasone (FLONASE) 50 MCG/ACT nasal spray Place 2 sprays into the nose daily.      . folic acid (FOLVITE) 1 MG tablet Take 1 tablet (1 mg total) by mouth daily.  30 tablet  6  . folic acid-vitamin b complex-vitamin c-selenium-zinc (DIALYVITE) 3 MG TABS Take 1 tablet by mouth daily.      . hydrALAZINE (APRESOLINE) 25 MG tablet Take 25 mg by mouth 2 (two) times daily.      . hydrOXYzine (ATARAX/VISTARIL) 25 MG tablet Take 25 mg by mouth as needed for itching.      . iron polysaccharides (NIFEREX) 150 MG capsule Take 150 mg by mouth daily.      Marland Kitchen labetalol (NORMODYNE) 100 MG tablet Take 100 mg by mouth 3 (three) times daily.      Marland Kitchen lanthanum (FOSRENOL) 1000 MG chewable tablet Chew 1,000 mg by  mouth 3 (three) times daily after meals.      . lidocaine-prilocaine (EMLA) cream Apply 1 application topically as needed. For pain      . loratadine (CLARITIN) 10 MG tablet Take 10 mg by mouth daily. For allergies.      Marland Kitchen losartan (COZAAR) 100 MG tablet Take 1 tablet (100 mg total) by mouth daily.  30 tablet  6  . nitroGLYCERIN (NITROSTAT) 0.4 MG SL tablet Place 1 tablet (0.4 mg total) under the tongue every 5 (five) minutes as needed for chest pain. For chest pain  25 tablet  3  . pantoprazole (PROTONIX) 40 MG tablet Take 1 tablet (40 mg total) by mouth daily.  90 tablet  1  . Ticagrelor (BRILINTA) 90 MG TABS tablet Take 90 mg by mouth 2 (two) times daily.      Marland Kitchen tobramycin-dexamethasone (TOBRADEX) ophthalmic solution Place 1 drop into the left eye 4 (four) times daily.       . traMADol (ULTRAM) 50 MG tablet Take 50 mg by mouth every 6 (six) hours as needed. For pain       No current facility-administered medications for this visit.    Past Medical History  Diagnosis Date   . Essential hypertension, benign   . Chronic bronchitis   . GERD (gastroesophageal reflux disease)   . PUD (peptic ulcer disease)   . History of lower GI bleeding   . Arthritis   . History of gout   . CAD (coronary artery disease)     a. 12/2011 NSTEMI/Cath/PCI LCX (2.25x14 Resolute DES) & D1 (2.25x22 Resolute DES);  b. 01/2012 Cath/PCI: LM 30, LAD 30p, 40-18m, D1 stent ok, 99 in sm branch of diag, LCX patent stent, OM1 20, RCA 95 ost (4.0x12 Promus DES), EF 55%;  c. 04/2012 Lexi Cardiolite  EF 48%, small area of scar @ base/mid inflat wall with mild peri-infarct ischemia.; CABG 12/4  . High cholesterol 12/2011  . Chronic diastolic CHF (congestive heart failure)     a. 02/2012 Echo EF 60-65%, nl wall motion, Gr 1 DD, mod MR  . Pneumonia ~ 2009  . Iron deficiency anemia   . TIA (transient ischemic attack)   . Anxiety   . History of blood transfusion 07/2011; 12/2011; 01/2012 X 2; 04/2012  . Carotid artery disease     a. A999333 LICA, [id number]   . Mitral regurgitation     a. Moderate by echo, 02/2012  . Myocardial infarction   . Ovarian cancer 1992  . Colon cancer 1992  . ESRD (end stage renal disease) on dialysis 12/15/11    a. HD @ [facility]; Mon, Wed, Fri    Past Surgical History  Procedure Laterality Date  . Abdominal hysterectomy  1992  . Appendectomy  06/1990  . Tubal ligation  1980's  . Av fistula placement  07/2009    left upper arm  . Thrombectomy / arteriovenous graft revision  2011    left upper arm  . Colon resection  1992  . Esophagogastroduodenoscopy  01/20/2012    Procedure: ESOPHAGOGASTRODUODENOSCOPY (EGD);  Surgeon: Ladene Artist, MD,FACG;  Location: [facility] ENDOSCOPY;  Service: Endoscopy;  Laterality: N/A;  . Dilation and curettage of uterus    . Coronary angioplasty with stent placement  12/15/11    "2"  . Coronary angioplasty with stent placement  y/2013    "1; makes total of 3" (05/02/2012)  . Coronary artery bypass graft  06/13/2012    Procedure: CORONARY ARTERY  BYPASS GRAFTING (CABG);  Surgeon: Grace Isaac, MD;  Location: [place];  Service: Open Heart Surgery;  Laterality: N/A;  cabg x four;  using left internal mammary artery, and left leg greater saphenous vein harvested endoscopically  . Intraoperative transesophageal echocardiogram  06/13/2012    Procedure: INTRAOPERATIVE TRANSESOPHAGEAL ECHOCARDIOGRAM;  Surgeon: Grace Isaac, MD;  Location: [place];  Service: Open Heart Surgery;  Laterality: N/A;  . Colectomy  1992    History   Social History  . Marital Status: Married    Spouse Name: N/A    Number of Children: N/A  . Years of Education: N/A   Occupational History  . Not on file.   Social History Main Topics  . Smoking status: Never Smoker   . Smokeless tobacco: Never Used  . Alcohol Use: No  . Drug Use: No  . Sexually Active: Yes   Other Topics Concern  . Not on file   Social History Narrative   Lives in [place], [place] with husband.  Dialysis pt - [facility].    Family History  Problem Relation Age of Onset  . Other      noncontributory for early CAD    ROS: no nausea, vomiting; no fever, chills; no melena, hematochezia; no claudication  PHYSICAL EXAM: BP 175/69  Pulse 66  Ht 5\' 1"  (1.549 m)  Wt 137 lb 6.4 oz (62.324 kg)  BMI 25.97 kg/m2  SpO2 97% GENERAL: 72 year old female; NAD  HEENT: NCAT, PERRLA, EOMI; sclera clear; no xanthelasma  NECK: no JVD; no TM  LUNGS: CTA bilaterally  CARDIAC: RRR (S1, S2); no significant murmurs; no rubs or gallops  ABDOMEN: soft, non-tender; intact BS  EXTREMETIES: Trace peripheral edema  SKIN: warm/dry; no obvious rash/lesions  MUSCULOSKELETAL: Well-healed midline incision  NEURO: no focal deficit; NL affect    EKG:    ASSESSMENT & PLAN:  Essential hypertension, benign Patient is on multimodal antihypertensive therapy with 5 medications. Two days ago, her clonidine dose was increased from 0.2 to 0.3 mg daily, by Dr. Pleas Koch. When I last saw her in March, she complained that  her BP was much better controlled on labetalol, which she had been on prior to undergoing CABG in December 2013, at [facility], and which was not resumed at time of discharge. I advised her to confer with Drs. Befekadu and Nadara Mustard, the latter initially placing her on this medication. She returns today having since been placed back on labetalol at 100 mg 3 times a day. Given the complexity of her anti-hypertensive regimen, and that she is a hemodialysis patient, I recommended that she follow up with Dr. Lowanda Foster, regarding continued close monitoring and management of her HTN. And given her recent adjustment of her clonidine dose, I elected to not make any additional modifications of her antihypertensive regimen. My bias would be that she be essentially followed by 1 of her many M.D.s, regarding future management of her HTN. Therefore, we will arrange an early followup appointment with Dr. Lowanda Foster within the next several days. In the meanwhile, she is to maintain a daily log of her BP readings, at least twice daily, with the morning reading to be taken before her medications.    Shawna [name], PAC

## 2012-12-12 NOTE — Patient Instructions (Signed)
   Keep blood pressure log.  Check first thing in the morning before am meds & later in the evening (twice a day only)  Appointment with Dr. Lowanda Foster - they will contact you about this  Continue all current medications. Follow up in September as previously planned

## 2012-12-12 NOTE — Assessment & Plan Note (Signed)
Patient is on multimodal antihypertensive therapy with 5 medications. Two days ago, her clonidine dose was increased from 0.2 to 0.3 mg daily, by Dr. Pleas Koch. When I last saw her in March, she complained that her BP was much better controlled on labetalol, which she had been on prior to undergoing CABG in December 2013, at [facility], and which was not resumed at time of discharge. I advised her to confer with Drs. Befekadu and Nadara Mustard, the latter initially placing her on this medication. She returns today having since been placed back on labetalol at 100 mg 3 times a day. Given the complexity of her anti-hypertensive regimen, and that she is a hemodialysis patient, I recommended that she follow up with Dr. Lowanda Foster, regarding continued close monitoring and management of her HTN. And given her recent adjustment of her clonidine dose, I elected to not make any additional modifications of her antihypertensive regimen. My bias would be that she be essentially followed by 1 of her many M.D.s, regarding future management of her HTN. Therefore, we will arrange an early followup appointment with Dr. Lowanda Foster within the next several days. In the meanwhile, she is to maintain a daily log of her BP readings, at least twice daily, with the morning reading to be taken before her medications.

## 2012-12-13 ENCOUNTER — Ambulatory Visit: Payer: Medicare Other | Admitting: Neurosurgery

## 2012-12-13 ENCOUNTER — Other Ambulatory Visit: Payer: Medicare Other

## 2012-12-15 DIAGNOSIS — R079 Chest pain, unspecified: Secondary | ICD-10-CM

## 2012-12-16 ENCOUNTER — Emergency Department (HOSPITAL_COMMUNITY)
Admission: EM | Admit: 2012-12-16 | Discharge: 2012-12-16 | Disposition: A | Discharge status: Home / Self Care | Payer: Medicare Other | Attending: Emergency Medicine | Admitting: Emergency Medicine

## 2012-12-16 ENCOUNTER — Emergency Department (HOSPITAL_COMMUNITY): Payer: Medicare Other

## 2012-12-16 ENCOUNTER — Encounter (HOSPITAL_COMMUNITY): Payer: Self-pay | Admitting: Emergency Medicine

## 2012-12-16 DIAGNOSIS — E7800 Pure hypercholesterolemia, unspecified: Secondary | ICD-10-CM | POA: Insufficient documentation

## 2012-12-16 DIAGNOSIS — K219 Gastro-esophageal reflux disease without esophagitis: Secondary | ICD-10-CM | POA: Insufficient documentation

## 2012-12-16 DIAGNOSIS — I120 Hypertensive chronic kidney disease with stage 5 chronic kidney disease or end stage renal disease: Secondary | ICD-10-CM | POA: Insufficient documentation

## 2012-12-16 DIAGNOSIS — R51 Headache: Secondary | ICD-10-CM | POA: Insufficient documentation

## 2012-12-16 DIAGNOSIS — J42 Unspecified chronic bronchitis: Secondary | ICD-10-CM | POA: Insufficient documentation

## 2012-12-16 DIAGNOSIS — I5032 Chronic diastolic (congestive) heart failure: Secondary | ICD-10-CM | POA: Insufficient documentation

## 2012-12-16 DIAGNOSIS — Z880 Allergy status to penicillin: Secondary | ICD-10-CM | POA: Insufficient documentation

## 2012-12-16 DIAGNOSIS — I2510 Atherosclerotic heart disease of native coronary artery without angina pectoris: Secondary | ICD-10-CM | POA: Insufficient documentation

## 2012-12-16 DIAGNOSIS — Z992 Dependence on renal dialysis: Secondary | ICD-10-CM | POA: Insufficient documentation

## 2012-12-16 DIAGNOSIS — R519 Headache, unspecified: Secondary | ICD-10-CM

## 2012-12-16 DIAGNOSIS — M1990 Unspecified osteoarthritis, unspecified site: Secondary | ICD-10-CM | POA: Insufficient documentation

## 2012-12-16 DIAGNOSIS — Z8739 Personal history of other diseases of the musculoskeletal system and connective tissue: Secondary | ICD-10-CM | POA: Insufficient documentation

## 2012-12-16 DIAGNOSIS — Z8719 Personal history of other diseases of the digestive system: Secondary | ICD-10-CM | POA: Insufficient documentation

## 2012-12-16 DIAGNOSIS — Z8673 Personal history of transient ischemic attack (TIA), and cerebral infarction without residual deficits: Secondary | ICD-10-CM | POA: Insufficient documentation

## 2012-12-16 DIAGNOSIS — Z8701 Personal history of pneumonia (recurrent): Secondary | ICD-10-CM | POA: Insufficient documentation

## 2012-12-16 DIAGNOSIS — Z862 Personal history of diseases of the blood and blood-forming organs and certain disorders involving the immune mechanism: Secondary | ICD-10-CM | POA: Insufficient documentation

## 2012-12-16 DIAGNOSIS — Z8543 Personal history of malignant neoplasm of ovary: Secondary | ICD-10-CM | POA: Insufficient documentation

## 2012-12-16 DIAGNOSIS — F411 Generalized anxiety disorder: Secondary | ICD-10-CM | POA: Insufficient documentation

## 2012-12-16 DIAGNOSIS — I509 Heart failure, unspecified: Secondary | ICD-10-CM | POA: Insufficient documentation

## 2012-12-16 DIAGNOSIS — I10 Essential (primary) hypertension: Secondary | ICD-10-CM

## 2012-12-16 DIAGNOSIS — Z85038 Personal history of other malignant neoplasm of large intestine: Secondary | ICD-10-CM | POA: Insufficient documentation

## 2012-12-16 DIAGNOSIS — Z79899 Other long term (current) drug therapy: Secondary | ICD-10-CM | POA: Insufficient documentation

## 2012-12-16 DIAGNOSIS — I252 Old myocardial infarction: Secondary | ICD-10-CM | POA: Insufficient documentation

## 2012-12-16 DIAGNOSIS — N186 End stage renal disease: Secondary | ICD-10-CM | POA: Insufficient documentation

## 2012-12-16 DIAGNOSIS — Z7982 Long term (current) use of aspirin: Secondary | ICD-10-CM | POA: Insufficient documentation

## 2012-12-16 LAB — BASIC METABOLIC PANEL
BUN: 51 mg/dL — ABNORMAL HIGH (ref 6–23)
CO2: 26 mEq/L (ref 19–32)
Chloride: 95 mEq/L — ABNORMAL LOW (ref 96–112)
GFR calc non Af Amer: 4 mL/min — ABNORMAL LOW (ref >90)
Glucose, Bld: 128 mg/dL — ABNORMAL HIGH (ref 70–99)
Potassium: 4.3 mEq/L (ref 3.5–5.1)
Sodium: 136 mEq/L (ref 135–145)

## 2012-12-16 LAB — CBC
HCT: 33.4 % — ABNORMAL LOW (ref 36.0–46.0)
Hemoglobin: 10.7 g/dL — ABNORMAL LOW (ref 12.0–15.0)
MCHC: 32 g/dL (ref 30.0–36.0)
RBC: 3.48 MIL/uL — ABNORMAL LOW (ref 3.87–5.11)

## 2012-12-16 LAB — URINALYSIS, ROUTINE W REFLEX MICROSCOPIC
Bilirubin Urine: NEGATIVE
Specific Gravity, Urine: 1.009 (ref 1.005–1.030)
Urobilinogen, UA: 0.2 mg/dL (ref 0.0–1.0)

## 2012-12-16 LAB — PROTIME-INR: INR: 0.99 (ref 0.00–1.49)

## 2012-12-16 LAB — URINE MICROSCOPIC-ADD ON

## 2012-12-16 LAB — POCT I-STAT TROPONIN I

## 2012-12-16 IMAGING — CT CT HEAD W/O CM
1 of 2 series · 13 of 30 positions shown, 17 images · non-contrast
Comparison: [DATE].

CLINICAL DATA: Posterior head pain.  Neck pain.

CT HEAD WITHOUT CONTRAST
TECHNIQUE: Contiguous axial images were obtained from the base of
the skull through the vertex without contrast.

[Series 2: brain · axial · 0.47mm/px · z∈[+96,+216]mm · 13 of 28 slices shown, 17 images]
[im 2/28  brain]
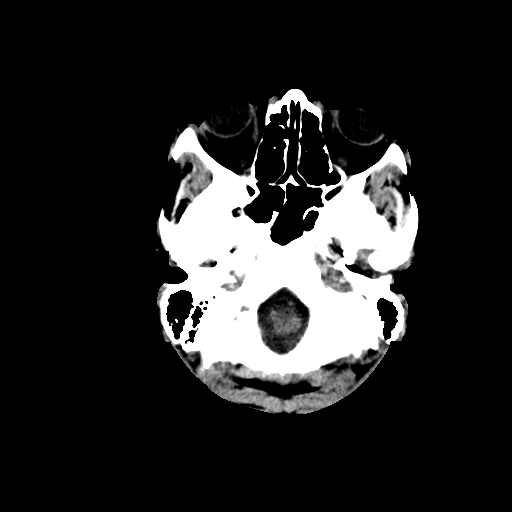
[im 2/28  bone]
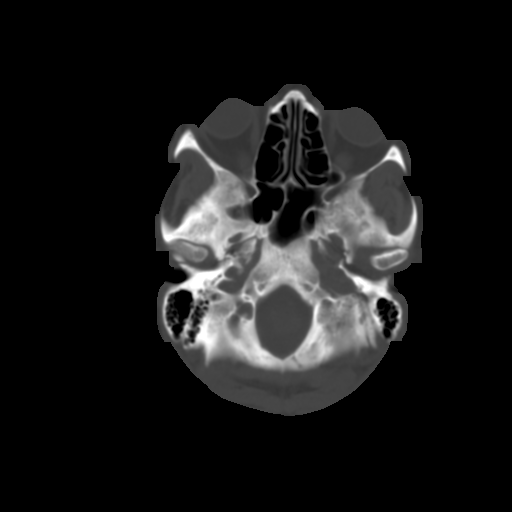
[im 4/28  brain]
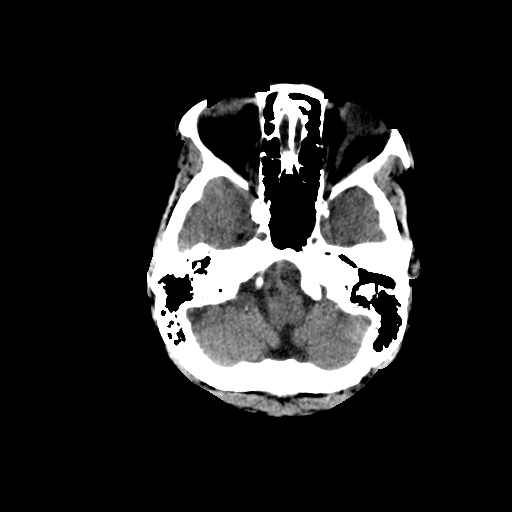
[im 6/28  brain]
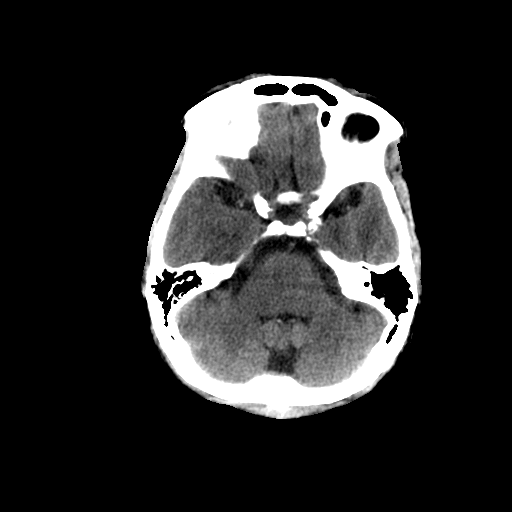
[im 8/28  brain]
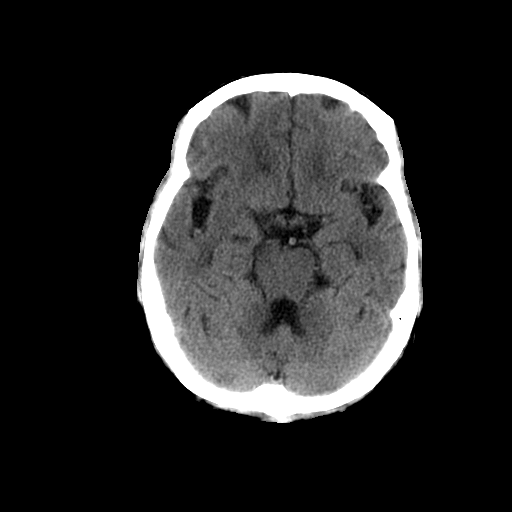
[im 10/28  brain]
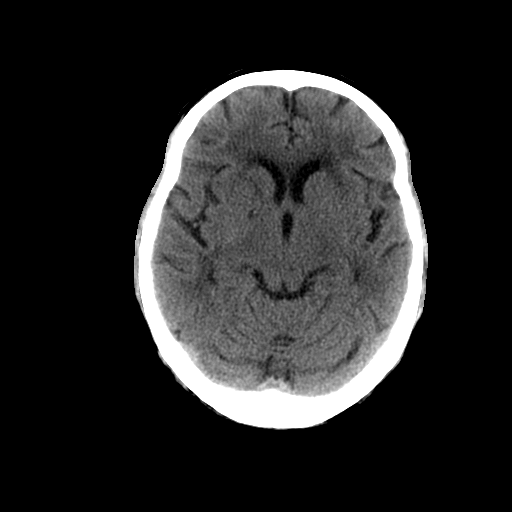
[im 10/28  bone]
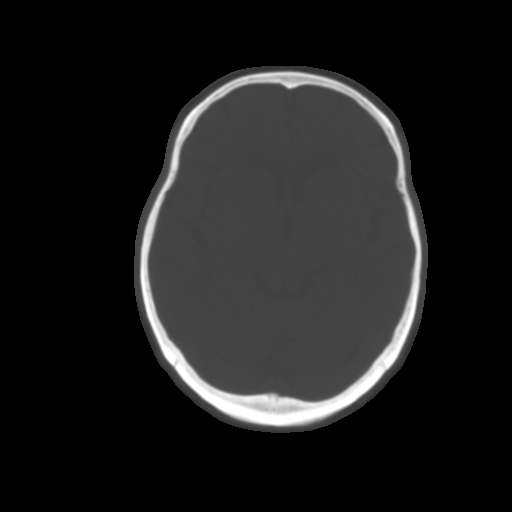
[im 12/28  brain]
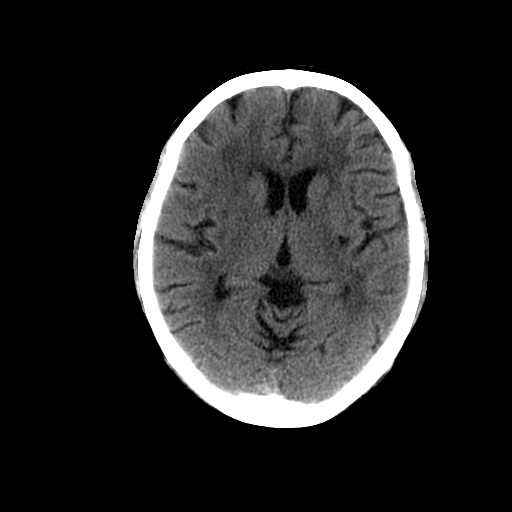
[im 14/28  brain]
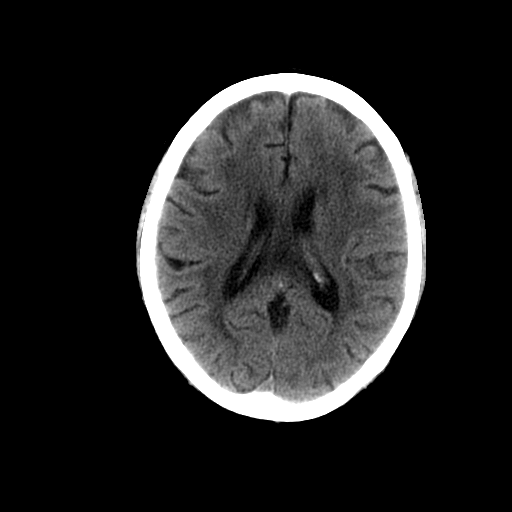
[im 16/28  brain]
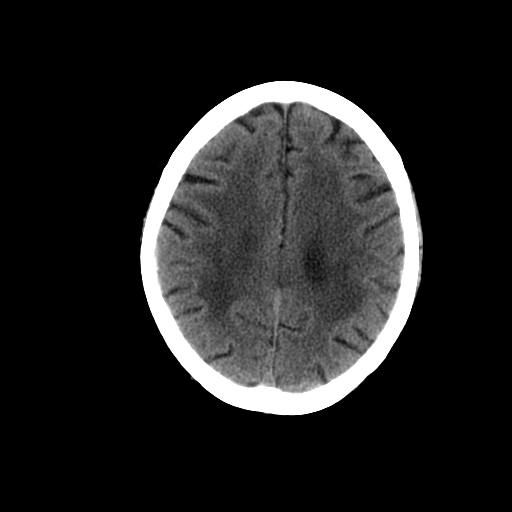
[im 18/28  brain]
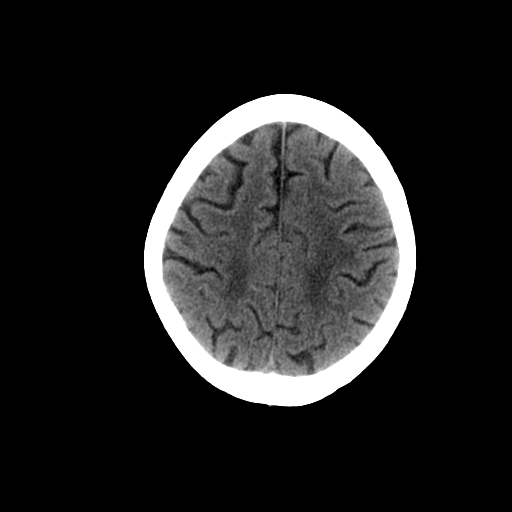
[im 18/28  bone]
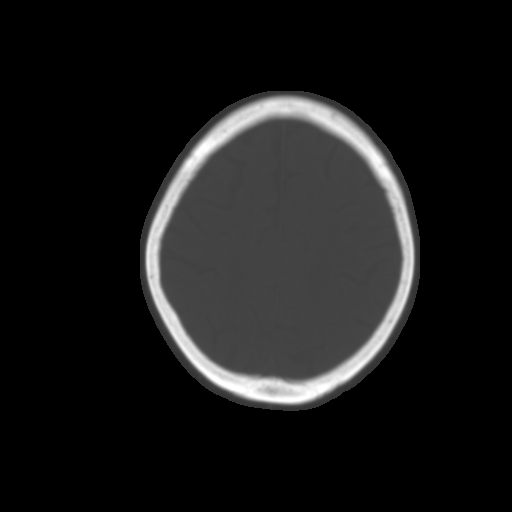
[im 20/28  brain]
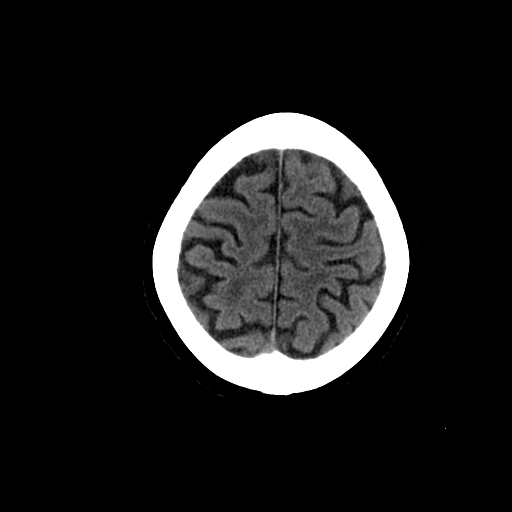
[im 22/28  brain]
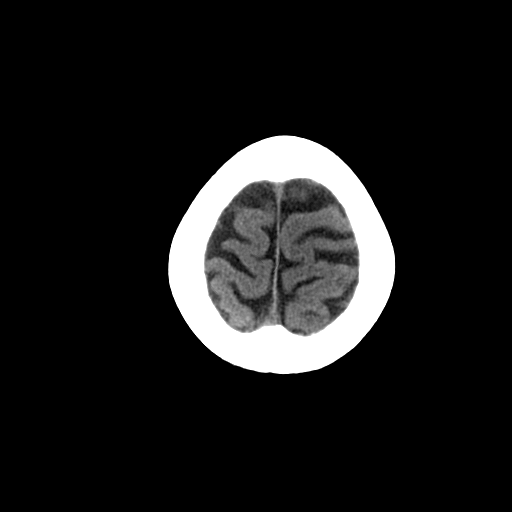
[im 24/28  brain]
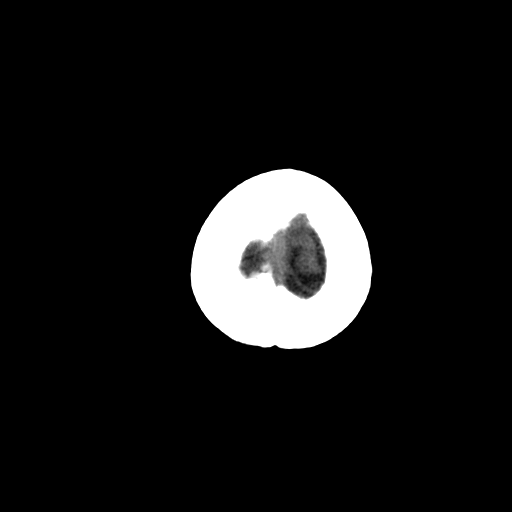
[im 26/28  brain]
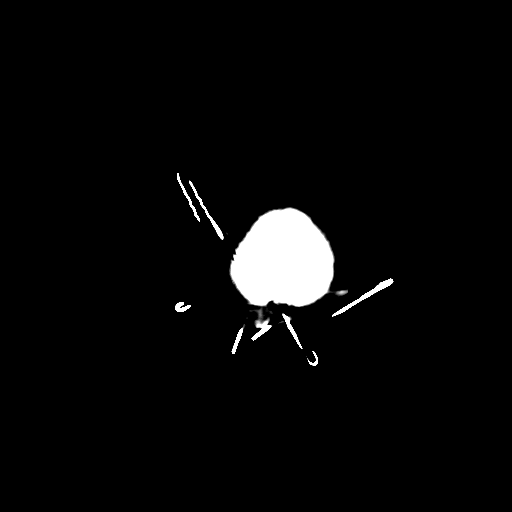
[im 26/28  bone]
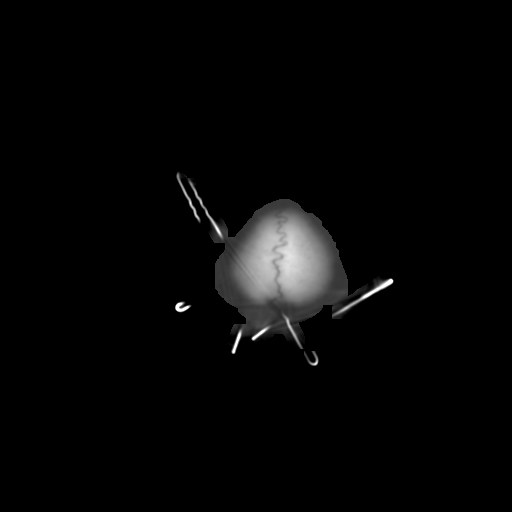

[13 of 30 positions shown; findings below may reference images not displayed]

FINDINGS: No mass lesion, mass effect, midline shift,
hydrocephalus, hemorrhage.  No acute territorial cortical
ischemia/infarct. Atrophy and chronic ischemic white matter disease
is present.  Old bilateral sub insular lacunar infarcts.
Intracranial atherosclerosis. Left sphenoid sinus mucous retention
cyst/polyp.
IMPRESSION: Atrophy and chronic ischemic white matter disease with scattered
lacunar infarcts.  No acute intracranial abnormality.

## 2012-12-16 IMAGING — CR DG CHEST 2V
2 series · 2 of 2 positions shown · non-contrast
Comparison: Chest x-ray at [facility] on [DATE]

CLINICAL DATA: Chest pain, shortness of breath and cough.

CHEST - 2 VIEW

[w chest pa]
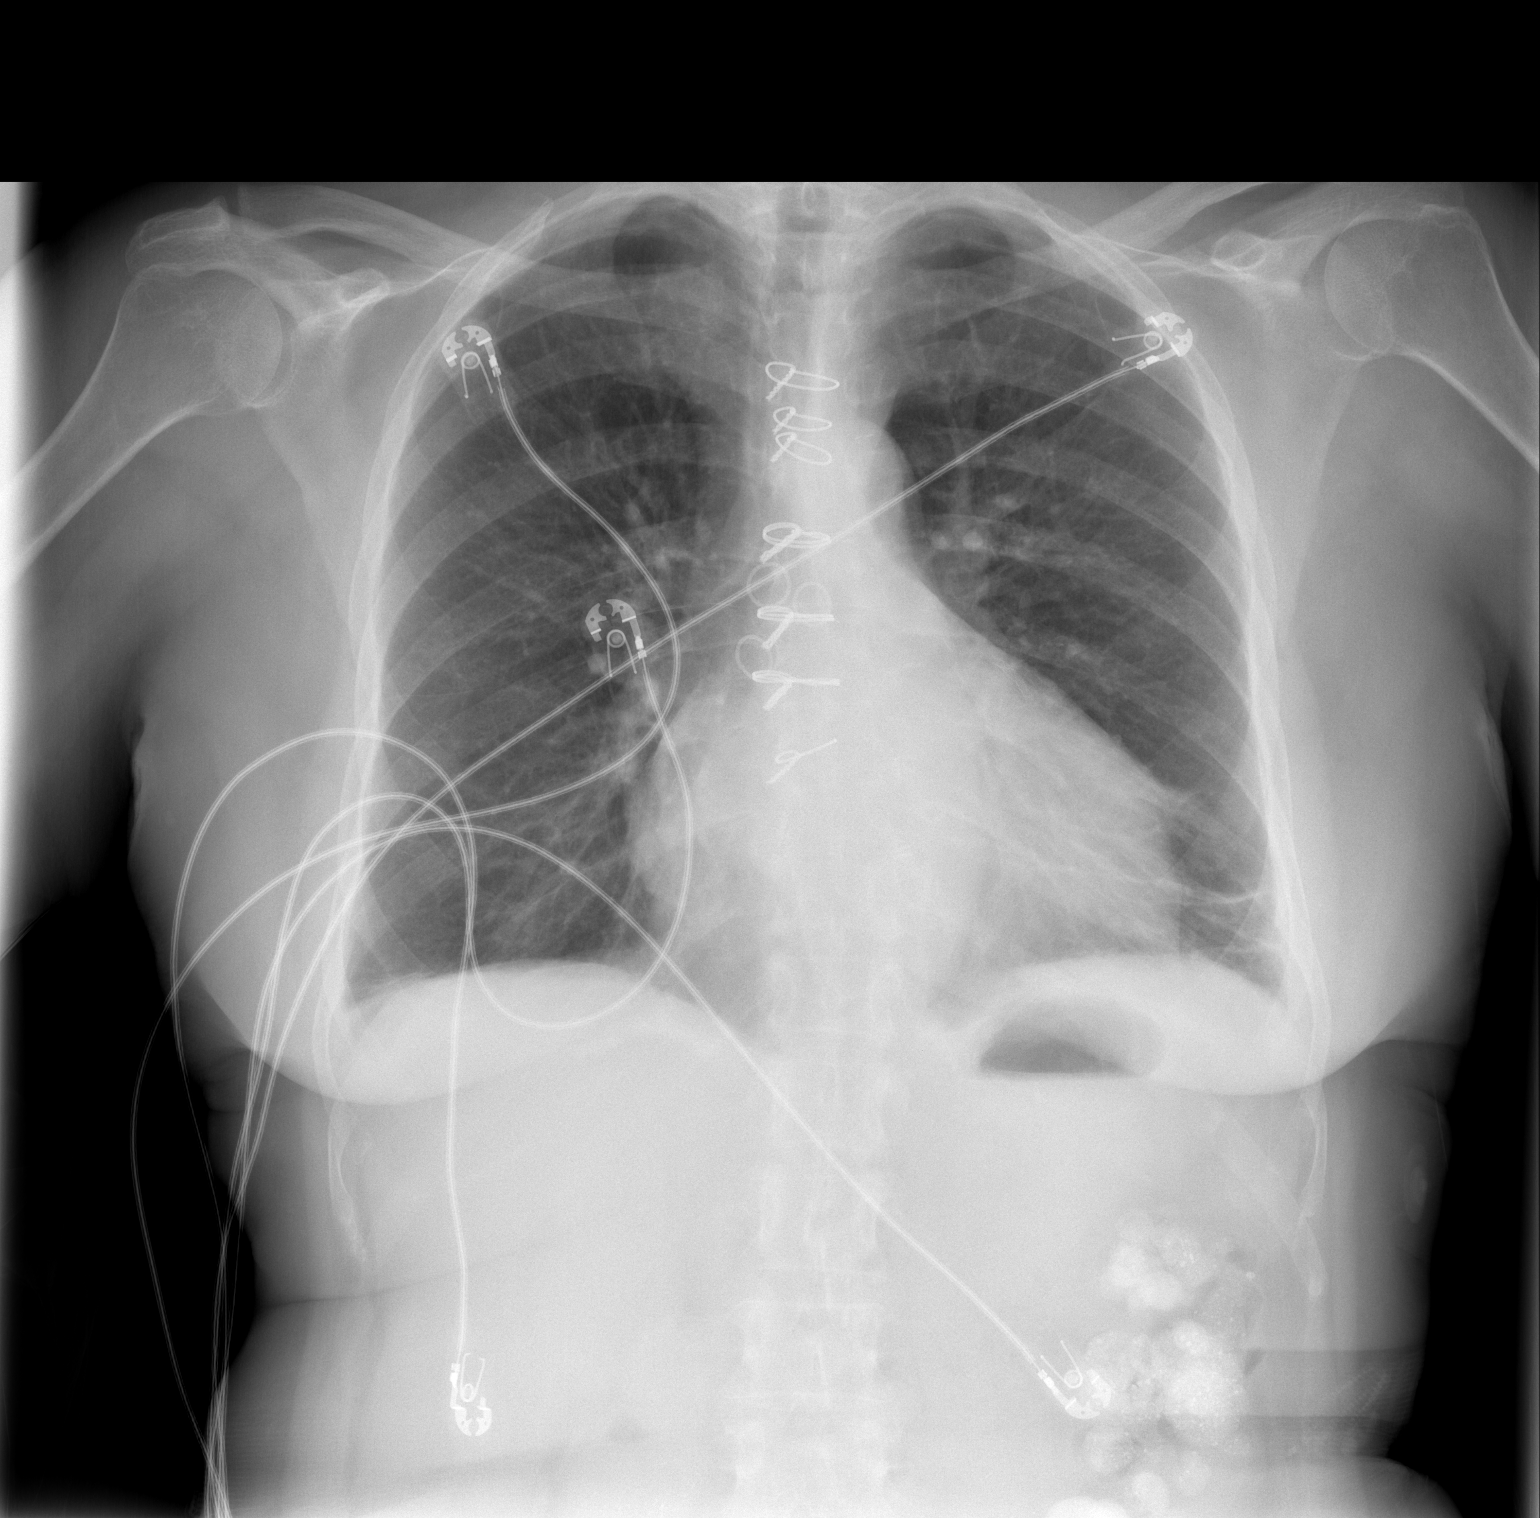

[w chest lat]
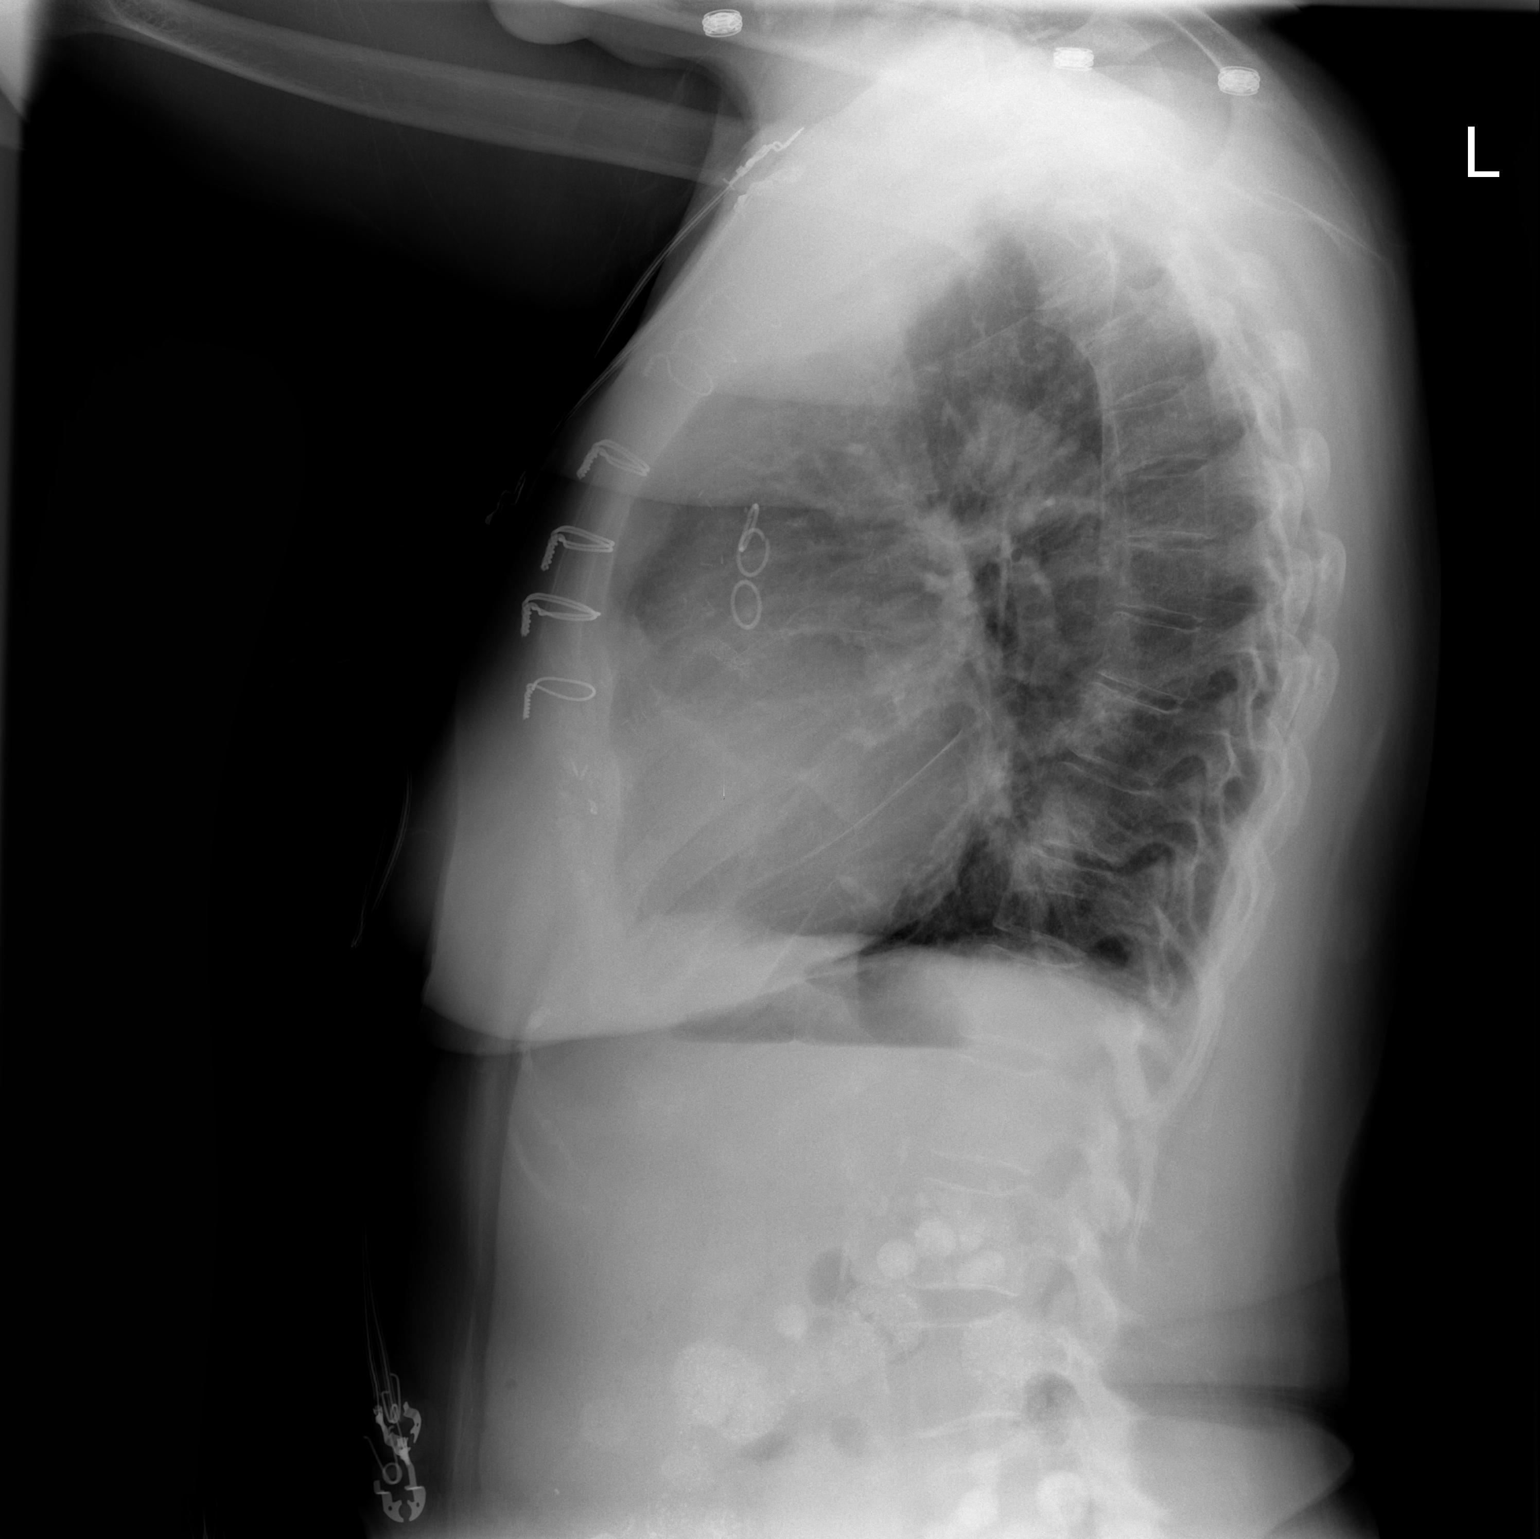

[2 of 2 positions shown; findings below may reference images not displayed]

FINDINGS: Decreased pulmonary vascular prominence since the prior
study.  Stable scarring present at the left lung base.  No
consolidation or pleural fluid is identified.  Heart size and
mediastinal contours are within normal limits with evidence of
prior CABG.  The bony thorax is unremarkable.
IMPRESSION: Less prominent vascular congestion compared to yesterday's chest x-
ray.  No acute findings.

## 2012-12-16 MED ORDER — SODIUM CHLORIDE 0.9 % IV SOLN
1000.0000 mL | INTRAVENOUS | Status: DC
  Administered 2012-12-16: 1000 mL via INTRAVENOUS

## 2012-12-16 MED ORDER — LABETALOL HCL 100 MG PO TABS: 200.0000 mg | ORAL_TABLET | Freq: Three times a day (TID) | ORAL | Status: DC

## 2012-12-16 MED ORDER — MORPHINE SULFATE 4 MG/ML IJ SOLN
4.0000 mg | Freq: Once | INTRAMUSCULAR | Status: AC
  Administered 2012-12-16: 4 mg via INTRAVENOUS
  Filled 2012-12-16: qty 1

## 2012-12-16 NOTE — ED Notes (Signed)
Pt c/o left sided cp that started yesterday. Pt states the pain feels like a pressure, pt rates pain 8/10. Pt states she felt like she had heartburn earlier this morning and states she had some n/v.

## 2012-12-16 NOTE — ED Notes (Addendum)
Patient presents to ED with complaints of chest pain, nausea, weakness, high blood pressure and shortness of breath since last night. Patient was seen at [facility] last night for high blood pressure and was given labetalol and sent home. Patient also reports a swelling feeling in her abdomin.

## 2012-12-16 NOTE — ED Notes (Signed)
Dr. Tomi Bamberger at bedside. Pt also c/o headache.

## 2012-12-16 NOTE — ED Provider Notes (Signed)
History    CSN: [id number] Arrival date & time 12/16/12  1649 First MD Initiated Contact with Patient 12/16/12 1706      Chief Complaint  Patient presents with  . Chest Pain    HPI The patient presents the emergency room with complaints of a headache, chest pain, and lower abdominal cramping. The symptoms started yesterday. They have persisted throughout the day into today. The patient has a history of multiple medical problems including coronary artery disease, hypertension and chronic renal failure. She is on dialysis and last had dialysis on Friday.  The patient has had symptoms like this off-and-on primarily associated with her blood pressure being poorly controlled to the patient states she used to be on beta wall which worked well for her. She was taken off of that after a surgical procedure several months ago.    Patient was seen last night it more head hospital for similar things. She was found to be very hypertensive and she was given medications for her blood pressure. Patient was then released. Patient actually also saw her cardiologist about 4 days ago. They recommended she see her nephrologist to manage her blood pressure. The patient presents today because the symptoms are persisting.  She has had some nausea but no vomiting. She denies any diarrhea. She denies any dysuria. Headache is all over her head. She does not have any focal numbness or weakness the chest pain is on the left side of her chest increases with palpation and movement. She has had a slight cough but no fever and no shortness of breath. Past Medical History  Diagnosis Date  . Essential hypertension, benign   . Chronic bronchitis   . GERD (gastroesophageal reflux disease)   . PUD (peptic ulcer disease)   . History of lower GI bleeding   . Arthritis   . History of gout   . CAD (coronary artery disease)     a. 12/2011 NSTEMI/Cath/PCI LCX (2.25x14 Resolute DES) & D1 (2.25x22 Resolute DES);  b. 01/2012 Cath/PCI: LM  30, LAD 30p, 40-32m, D1 stent ok, 99 in sm branch of diag, LCX patent stent, OM1 20, RCA 95 ost (4.0x12 Promus DES), EF 55%;  c. 04/2012 Lexi Cardiolite  EF 48%, small area of scar @ base/mid inflat wall with mild peri-infarct ischemia.; CABG 12/4  . High cholesterol 12/2011  . Chronic diastolic CHF (congestive heart failure)     a. 02/2012 Echo EF 60-65%, nl wall motion, Gr 1 DD, mod MR  . Pneumonia ~ 2009  . Iron deficiency anemia   . TIA (transient ischemic attack)   . Anxiety   . History of blood transfusion 07/2011; 12/2011; 01/2012 X 2; 04/2012  . Carotid artery disease     a. [id number] LICA, [id number]   . Mitral regurgitation     a. Moderate by echo, 02/2012  . Myocardial infarction   . Ovarian cancer 1992  . Colon cancer 1992  . ESRD (end stage renal disease) on dialysis 12/15/11    a. HD @ [facility]; Mon, Wed, Fri    Past Surgical History  Procedure Laterality Date  . Abdominal hysterectomy  1992  . Appendectomy  06/1990  . Tubal ligation  1980's  . Av fistula placement  07/2009    left upper arm  . Thrombectomy / arteriovenous graft revision  2011    left upper arm  . Colon resection  1992  . Esophagogastroduodenoscopy  01/20/2012    Procedure: ESOPHAGOGASTRODUODENOSCOPY (EGD);  Surgeon: Ladene Artist,  MD,FACG;  Location: [place] [facility];  Service: Endoscopy;  Laterality: N/A;  . Dilation and curettage of uterus    . Coronary angioplasty with stent placement  12/15/11    "2"  . Coronary angioplasty with stent placement  y/2013    "1; makes total of 3" (05/02/2012)  . Coronary artery bypass graft  06/13/2012    Procedure: CORONARY ARTERY BYPASS GRAFTING (CABG);  Surgeon: Grace Isaac, MD;  Location: [place];  Service: Open Heart Surgery;  Laterality: N/A;  cabg x four;  using left internal mammary artery, and left leg greater saphenous vein harvested endoscopically  . Intraoperative transesophageal echocardiogram  06/13/2012    Procedure: INTRAOPERATIVE TRANSESOPHAGEAL  ECHOCARDIOGRAM;  Surgeon: Grace Isaac, MD;  Location: [place];  Service: Open Heart Surgery;  Laterality: N/A;  . Colectomy  1992    Family History  Problem Relation Age of Onset  . Other      noncontributory for early CAD    History  Substance Use Topics  . Smoking status: Never Smoker   . Smokeless tobacco: Never Used  . Alcohol Use: No    OB History   Grav Para Term Preterm Abortions TAB SAB Ect Mult Living                  Review of Systems  All other systems reviewed and are negative.    Allergies  Aspirin; Contrast media; Iron; Macrodantin; Penicillins; Plavix; Bactrim; Sulfa antibiotics; and Venofer  Home Medications   Current Outpatient Rx  Name  Route  Sig  Dispense  Refill  . albuterol (PROVENTIL HFA;VENTOLIN HFA) 108 (90 BASE) MCG/ACT inhaler   Inhalation   Inhale 2 puffs into the lungs every 6 (six) hours as needed for wheezing or shortness of breath.          . ALPRAZolam (XANAX) 0.5 MG tablet   Oral   Take 0.5 mg by mouth 3 (three) times daily.         Marland Kitchen amLODipine (NORVASC) 10 MG tablet   Oral   Take 10 mg by mouth daily.         Marland Kitchen aspirin 81 MG chewable tablet   Oral   Chew 81 mg by mouth daily.         Marland Kitchen atorvastatin (LIPITOR) 20 MG tablet   Oral   Take 20 mg by mouth daily.         . cloNIDine (CATAPRES) 0.3 MG tablet   Oral   Take 0.3 mg by mouth daily.         . fluticasone (FLONASE) 50 MCG/ACT nasal spray   Nasal   Place 2 sprays into the nose daily.         . folic acid (FOLVITE) 1 MG tablet   Oral   Take 1 tablet (1 mg total) by mouth daily.   30 tablet   6   . folic acid-vitamin b complex-vitamin c-selenium-zinc (DIALYVITE) 3 MG TABS   Oral   Take 1 tablet by mouth daily.         . hydrALAZINE (APRESOLINE) 25 MG tablet   Oral   Take 25 mg by mouth 2 (two) times daily.         . hydrOXYzine (ATARAX/VISTARIL) 25 MG tablet   Oral   Take 25 mg by mouth daily as needed for itching.          .  iron polysaccharides (NIFEREX) 150 MG capsule   Oral   Take  150 mg by mouth daily.         Marland Kitchen lanthanum (FOSRENOL) 1000 MG chewable tablet   Oral   Chew 1,000 mg by mouth 3 (three) times daily after meals.         . lidocaine-prilocaine (EMLA) cream   Topical   Apply 1 application topically every Monday, Wednesday, and Friday.          . loratadine (CLARITIN) 10 MG tablet   Oral   Take 10 mg by mouth daily.          Marland Kitchen losartan (COZAAR) 100 MG tablet   Oral   Take 1 tablet (100 mg total) by mouth daily.   30 tablet   6   . nitroGLYCERIN (NITROSTAT) 0.4 MG SL tablet   Sublingual   Place 0.4 mg under the tongue every 5 (five) minutes as needed for chest pain.         . pantoprazole (PROTONIX) 40 MG tablet   Oral   Take 1 tablet (40 mg total) by mouth daily.   90 tablet   1   . Ticagrelor (BRILINTA) 90 MG TABS tablet   Oral   Take 90 mg by mouth 2 (two) times daily.         Marland Kitchen tobramycin-dexamethasone (TOBRADEX) ophthalmic solution   Left Eye   Place 1 drop into the left eye 4 (four) times daily.          . traMADol (ULTRAM) 50 MG tablet   Oral   Take 50 mg by mouth every 6 (six) hours as needed for pain.          Marland Kitchen labetalol (NORMODYNE) 100 MG tablet   Oral   Take 2 tablets (200 mg total) by mouth 3 (three) times daily.   90 tablet   1     BP 203/68  Pulse 64  Temp(Src) 98 F (36.7 C) (Oral)  Resp 17  Ht 5\' 1"  (1.549 m)  Wt 138 lb (62.596 kg)  BMI 26.09 kg/m2  SpO2 100%  Physical Exam  Nursing note and vitals reviewed. Constitutional: She appears well-developed and well-nourished. No distress.  HENT:  Head: Normocephalic and atraumatic.  Right Ear: External ear normal.  Left Ear: External ear normal.  Eyes: Conjunctivae are normal. Right eye exhibits no discharge. Left eye exhibits no discharge. No scleral icterus.  Neck: Neck supple. No tracheal deviation present.  Cardiovascular: Normal rate, regular rhythm and intact distal pulses.    Pulmonary/Chest: Effort normal and breath sounds normal. No stridor. No respiratory distress. She has no wheezes. She has no rales. She exhibits tenderness (reproduces her pain).  Abdominal: Soft. Bowel sounds are normal. She exhibits no distension. There is no tenderness. There is no rebound and no guarding.  Musculoskeletal: She exhibits no edema and no tenderness.  Neurological: She is alert. She has normal strength. No sensory deficit. Cranial nerve deficit:  no gross defecits noted. She exhibits normal muscle tone. She displays no seizure activity. Coordination normal.  Skin: Skin is warm and dry. No rash noted.  Psychiatric: She has a normal mood and affect.    ED Course  Procedures (including critical care time) EKG Normal sinus rhythm rate 68 Normal axis, normal intervals Left ventricular hypertrophy with repolarization abnormality No significant change when compared to prior EKG Labs Reviewed  CBC - Abnormal; Notable for the following:    RBC 3.48 (*)    Hemoglobin 10.7 (*)    HCT 33.4 (*)  RDW 17.6 (*)    All other components within normal limits  BASIC METABOLIC PANEL - Abnormal; Notable for the following:    Chloride 95 (*)    Glucose, Bld 128 (*)    BUN 51 (*)    Creatinine, Ser 9.25 (*)    Calcium 10.6 (*)    GFR calc non Af Amer 4 (*)    GFR calc Af Amer 4 (*)    All other components within normal limits  PRO B NATRIURETIC PEPTIDE - Abnormal; Notable for the following:    Pro B Natriuretic peptide (BNP) 8774.0 (*)    All other components within normal limits  URINALYSIS, ROUTINE W REFLEX MICROSCOPIC - Abnormal; Notable for the following:    APPearance CLOUDY (*)    Hgb urine dipstick TRACE (*)    Protein, ur 100 (*)    Leukocytes, UA SMALL (*)    All other components within normal limits  URINE MICROSCOPIC-ADD ON - Abnormal; Notable for the following:    Squamous Epithelial / LPF MANY (*)    Bacteria, UA FEW (*)    All other components within normal  limits  URINE CULTURE  PROTIME-INR  APTT  POCT I-STAT TROPONIN I   Dg Chest 2 View  12/16/2012   *RADIOLOGY REPORT*  Clinical Data: Chest pain, shortness of breath and cough.  CHEST - 2 VIEW  Comparison: Chest x-ray at [facility] on 12/15/2012  Findings: Decreased pulmonary vascular prominence since the prior study.  Stable scarring present at the left lung base.  No consolidation or pleural fluid is identified.  Heart size and mediastinal contours are within normal limits with evidence of prior CABG.  The bony thorax is unremarkable.  IMPRESSION: Less prominent vascular congestion compared to yesterday's chest x- ray.  No acute findings.   Original Report Authenticated By: Aletta Edouard, M.D.   Ct Head Wo Contrast  12/16/2012   *RADIOLOGY REPORT*  Clinical Data: Posterior head pain.  Neck pain.  CT HEAD WITHOUT CONTRAST  Technique:  Contiguous axial images were obtained from the base of the skull through the vertex without contrast.  Comparison: 12/07/2011.  Findings: No mass lesion, mass effect, midline shift, hydrocephalus, hemorrhage.  No acute territorial cortical ischemia/infarct. Atrophy and chronic ischemic white matter disease is present.  Old bilateral sub insular lacunar infarcts. Intracranial atherosclerosis. Left sphenoid sinus mucous retention cyst/polyp.  IMPRESSION: Atrophy and chronic ischemic white matter disease with scattered lacunar infarcts.  No acute intracranial abnormality.   Original Report Authenticated By: Dereck Ligas, M.D.      MDM  The patient has chronic renal failure on dialysis. Her laboratory tests are consistent with that but she does not have any evidence of hyperkalemia or severe acidosis.  Patient does not have any evidence of sginficant pulmonary edema on  chest x-ray. Her blood pressure is slightly elevated but not significantly so.  Head CT does not show any acute bleed.  Patient thinks that her symptoms are primarily related to her  poorly controlled high blood pressure. She had been previously taking labetalol. I have reviewed the outpatient notes from her most recent cardiology visit. Essentially they recommend one doctor managing her blood pressure and this has generally been her nephrologist. The family wants to know why she is not taking labetalol now. I recommended she contact her doctors to discuss her blood pressure medications further.  It is reasonable with her persistent elevated bp to increase her labetalol back to 200 mg tid.  Kathalene Frames, MD 12/16/12 2006

## 2012-12-18 LAB — URINE CULTURE: Colony Count: >=100000

## 2012-12-30 DIAGNOSIS — I10 Essential (primary) hypertension: Secondary | ICD-10-CM

## 2013-01-10 ENCOUNTER — Other Ambulatory Visit (HOSPITAL_COMMUNITY): Payer: Self-pay | Admitting: Internal Medicine

## 2013-01-21 DIAGNOSIS — R079 Chest pain, unspecified: Secondary | ICD-10-CM

## 2013-01-22 DIAGNOSIS — R079 Chest pain, unspecified: Secondary | ICD-10-CM

## 2013-01-23 ENCOUNTER — Encounter: Payer: Self-pay | Admitting: Vascular Surgery

## 2013-01-24 ENCOUNTER — Encounter (INDEPENDENT_AMBULATORY_CARE_PROVIDER_SITE_OTHER): Payer: Medicare Other | Admitting: *Deleted

## 2013-01-24 ENCOUNTER — Other Ambulatory Visit (INDEPENDENT_AMBULATORY_CARE_PROVIDER_SITE_OTHER): Payer: Medicare Other | Admitting: Vascular Surgery

## 2013-01-24 ENCOUNTER — Ambulatory Visit (INDEPENDENT_AMBULATORY_CARE_PROVIDER_SITE_OTHER): Payer: Medicare Other | Admitting: Vascular Surgery

## 2013-01-24 ENCOUNTER — Encounter: Payer: Self-pay | Admitting: Vascular Surgery

## 2013-01-24 DIAGNOSIS — I739 Peripheral vascular disease, unspecified: Secondary | ICD-10-CM

## 2013-01-24 DIAGNOSIS — R0989 Other specified symptoms and signs involving the circulatory and respiratory systems: Secondary | ICD-10-CM

## 2013-01-24 DIAGNOSIS — M79609 Pain in unspecified limb: Secondary | ICD-10-CM

## 2013-01-24 DIAGNOSIS — N186 End stage renal disease: Secondary | ICD-10-CM

## 2013-01-24 DIAGNOSIS — I6529 Occlusion and stenosis of unspecified carotid artery: Secondary | ICD-10-CM

## 2013-01-24 NOTE — Progress Notes (Addendum)
Patient is a 72 year old female that we have been following for a moderate carotid stenosis. She returns today for further followup. Apparently she was taken to the emergency room at [facility] on July 14. She was complaining of shortness of breath at that time. She had a V/Q scan done which was normal. Today she is also complaining of left lower extremity pain extending from the groin down to the foot. She said this was fairly acute in onset. She denies any swelling. She denies any prior problems with left lower extremity. She denies any symptoms of TIA amaurosis or stroke related to her carotid stenosis. She denies symptoms of claudication or rest pain in the past. Chronic medical problems include coronary artery disease hypertension end-stage renal disease requiring dialysis all of which are currently stable.  History   Social History  . Marital Status: Married    Spouse Name: N/A    Number of Children: N/A  . Years of Education: N/A   Occupational History  . Not on file.   Social History Main Topics  . Smoking status: Never Smoker   . Smokeless tobacco: Never Used  . Alcohol Use: No  . Drug Use: No  . Sexually Active: Yes   Other Topics Concern  . Not on file   Social History Narrative   Lives in [place], [place] with husband.  Dialysis pt - [facility].   Family History  Problem Relation Age of Onset  . Other      noncontributory for early CAD  . Heart disease Mother     Heart Disease before age 20  . Hyperlipidemia Mother   . Hypertension Mother   . Heart disease Father   . Hyperlipidemia Father   . Hypertension Father      Current Outpatient Prescriptions on File Prior to Visit  Medication Sig Dispense Refill  . albuterol (PROVENTIL HFA;VENTOLIN HFA) 108 (90 BASE) MCG/ACT inhaler Inhale 2 puffs into the lungs every 6 (six) hours as needed for wheezing or shortness of breath.       . ALPRAZolam (XANAX) 0.5 MG tablet Take 0.5 mg by mouth 3 (three) times daily.      Marland Kitchen amLODipine  (NORVASC) 10 MG tablet Take 10 mg by mouth daily.      Marland Kitchen aspirin 81 MG chewable tablet Chew 81 mg by mouth daily.      Marland Kitchen atorvastatin (LIPITOR) 20 MG tablet Take 20 mg by mouth daily.      . cloNIDine (CATAPRES) 0.3 MG tablet Take 0.3 mg by mouth daily.      . fluticasone (FLONASE) 50 MCG/ACT nasal spray Place 2 sprays into the nose daily.      . folic acid (FOLVITE) 1 MG tablet Take 1 tablet (1 mg total) by mouth daily.  30 tablet  6  . folic acid-vitamin b complex-vitamin c-selenium-zinc (DIALYVITE) 3 MG TABS Take 1 tablet by mouth daily.      . hydrOXYzine (ATARAX/VISTARIL) 25 MG tablet Take 25 mg by mouth daily as needed for itching.       . iron polysaccharides (NIFEREX) 150 MG capsule Take 150 mg by mouth daily.      Marland Kitchen labetalol (NORMODYNE) 100 MG tablet Take 2 tablets (200 mg total) by mouth 3 (three) times daily.  90 tablet  1  . lanthanum (FOSRENOL) 1000 MG chewable tablet Chew 1,000 mg by mouth 3 (three) times daily after meals.      . lidocaine-prilocaine (EMLA) cream Apply 1 application topically every Monday, Wednesday,  and Friday.       . loratadine (CLARITIN) 10 MG tablet Take 10 mg by mouth daily.       . nitroGLYCERIN (NITROSTAT) 0.4 MG SL tablet Place 0.4 mg under the tongue every 5 (five) minutes as needed for chest pain.      . pantoprazole (PROTONIX) 40 MG tablet Take 1 tablet (40 mg total) by mouth daily.  90 tablet  1  . Ticagrelor (BRILINTA) 90 MG TABS tablet Take 90 mg by mouth 2 (two) times daily.      Marland Kitchen tobramycin-dexamethasone (TOBRADEX) ophthalmic solution Place 1 drop into the left eye 4 (four) times daily.       . traMADol (ULTRAM) 50 MG tablet Take 50 mg by mouth every 6 (six) hours as needed for pain.       . hydrALAZINE (APRESOLINE) 25 MG tablet Take 25 mg by mouth 2 (two) times daily.      Marland Kitchen losartan (COZAAR) 100 MG tablet Take 1 tablet (100 mg total) by mouth daily.  30 tablet  6   No current facility-administered medications on file prior to visit.      Allergies  Allergen Reactions  . Aspirin Other (See Comments)    Mess up her stomach; "makes my bowels have blood in them". Takes 81 mg EC Aspirin   . Contrast Media (Iodinated Diagnostic Agents) Itching  . Iron Itching and Other (See Comments)    "they gave me iron in dialysis; had to give me Benadryl cause I had to have the iron" (05/02/2012)  . Macrodantin (Nitrofurantoin Macrocrystal) Other (See Comments)    "broke me out in big old knots all over my body; had to go to ER"  . Penicillins Other (See Comments)    "makes me real weak when I take it; like I'll pass out"  . Plavix (Clopidogrel Bisulfate) Rash  . Bactrim (Sulfamethoxazole W-Trimethoprim) Rash  . Sulfa Antibiotics Rash  . Venofer (Ferric Oxide) Itching    Patient reports using Benadryl prior to doses as [facility]     Past Medical History  Diagnosis Date  . Essential hypertension, benign   . Chronic bronchitis   . GERD (gastroesophageal reflux disease)   . PUD (peptic ulcer disease)   . History of lower GI bleeding   . Arthritis   . History of gout   . CAD (coronary artery disease)     a. 12/2011 NSTEMI/Cath/PCI LCX (2.25x14 Resolute DES) & D1 (2.25x22 Resolute DES);  b. 01/2012 Cath/PCI: LM 30, LAD 30p, 40-22m, D1 stent ok, 99 in sm branch of diag, LCX patent stent, OM1 20, RCA 95 ost (4.0x12 Promus DES), EF 55%;  c. 04/2012 Lexi Cardiolite  EF 48%, small area of scar @ base/mid inflat wall with mild peri-infarct ischemia.; CABG 12/4  . High cholesterol 12/2011  . Chronic diastolic CHF (congestive heart failure)     a. 02/2012 Echo EF 60-65%, nl wall motion, Gr 1 DD, mod MR  . Pneumonia ~ 2009  . Iron deficiency anemia   . TIA (transient ischemic attack)   . Anxiety   . History of blood transfusion 07/2011; 12/2011; 01/2012 X 2; 04/2012  . Carotid artery disease     a. A999333 LICA, [id number]   . Mitral regurgitation     a. Moderate by echo, 02/2012  . Myocardial infarction   . Ovarian cancer 1992  . Colon  cancer 1992  . ESRD (end stage renal disease) on dialysis 12/15/11    a.  HD @ [facility]; Mon, Wed, Fri    Past Surgical History  Procedure Laterality Date  . Abdominal hysterectomy  1992  . Appendectomy  06/1990  . Tubal ligation  1980's  . Av fistula placement  07/2009    left upper arm  . Thrombectomy / arteriovenous graft revision  2011    left upper arm  . Colon resection  1992  . Esophagogastroduodenoscopy  01/20/2012    Procedure: ESOPHAGOGASTRODUODENOSCOPY (EGD);  Surgeon: Ladene Artist, MD,FACG;  Location: [facility];  Service: Endoscopy;  Laterality: N/A;  . Dilation and curettage of uterus    . Coronary angioplasty with stent placement  12/15/11    "2"  . Coronary angioplasty with stent placement  y/2013    "1; makes total of 3" (05/02/2012)  . Coronary artery bypass graft  06/13/2012    Procedure: CORONARY ARTERY BYPASS GRAFTING (CABG);  Surgeon: Grace Isaac, MD;  Location: [place];  Service: Open Heart Surgery;  Laterality: N/A;  cabg x four;  using left internal mammary artery, and left leg greater saphenous vein harvested endoscopically  . Intraoperative transesophageal echocardiogram  06/13/2012    Procedure: INTRAOPERATIVE TRANSESOPHAGEAL ECHOCARDIOGRAM;  Surgeon: Grace Isaac, MD;  Location: [place];  Service: Open Heart Surgery;  Laterality: N/A;  . Colectomy  1992   Review of systems: She denies shortness of breath today. She denies chest pain. Neuro: As above  Physical exam:  Filed Vitals:   01/24/13 1529  BP: 156/67  Pulse: 88  Resp: 16  Height: 5\' 1"  (1.549 m)  Weight: 141 lb (63.957 kg)  SpO2: 97%   Neck: No carotid bruits  Chest: Clear to auscultation bilaterally  Cardiac: Regular rate and rhythm without murmur  Extremities: Patent left upper arm AV graft 2+ femoral pulses bilaterally absent popliteal and pedal pulses feet pink warm and well-perfused trace edema left lower extremity compared to right  Neuro: Symmetric upper and lower  extremity motor strength which is 5 over 5  Abdomen: Soft nontender nondistended no mass  Skin: No open ulcers or rash  Data: Patient had a venous duplex exam today to rule out DVT. This was negative. She also had bilateral ABIs performed today which were 0.67 on the left 0.78 on the right.  I reviewed and interpreted the studies.  Assessment: #1 moderate carotid stenosis continue current management needs repeat carotid duplex in 6 months #2 left lower extremity pain has evidence upper for arterial disease but her symptomatology is acute in nature and I do not believe this is an acute arterial problems. She also has no evidence of DVT. I did give her prescription today for Percocet #30 dispensed. If she has further pain in her left lower extremity she can follow up with her primary care physician in the next few days.  Ruta Hinds, MD [facility] Office: [phone] Pager: [phone] Carotid duplex today showed less than 40% stenosis bilaterally I reviewed and interpreted this study  Ruta Hinds, MD [facility] [place] Office: [phone] Pager: [phone]

## 2013-01-26 ENCOUNTER — Other Ambulatory Visit: Payer: Self-pay | Admitting: Cardiology

## 2013-01-29 NOTE — Addendum Note (Signed)
Addended by: Mena Goes on: 01/29/2013 09:31 AM   Modules accepted: Orders

## 2013-01-31 ENCOUNTER — Encounter: Payer: Self-pay | Admitting: Internal Medicine

## 2013-02-07 ENCOUNTER — Other Ambulatory Visit: Payer: Self-pay | Admitting: *Deleted

## 2013-02-07 DIAGNOSIS — I739 Peripheral vascular disease, unspecified: Secondary | ICD-10-CM

## 2013-02-20 DIAGNOSIS — I959 Hypotension, unspecified: Secondary | ICD-10-CM

## 2013-03-06 ENCOUNTER — Telehealth: Payer: Self-pay | Admitting: Physician Assistant

## 2013-03-06 NOTE — Telephone Encounter (Signed)
Patient is calling in reference to having a rash on her stomach for 2 weeks - taking PROTONIX 40 MG PO and was Told by Dr. Encarnacion Slates to contact our office because we put her on this medication.

## 2013-03-07 ENCOUNTER — Telehealth: Payer: Self-pay | Admitting: Gastroenterology

## 2013-03-07 NOTE — Telephone Encounter (Signed)
Patient is scheduled for tomorrow at 10:00 with Tye Savoy RNP

## 2013-03-07 NOTE — Telephone Encounter (Signed)
Spoke with patient and she informed nurse that the protonix has started giving her a rash. Nurse advised patient to stop the protonix and she needed get further instructions from her PCP r/e replacement for PPI. Patient said "I would like to see the stomach doctor that ran the light down my throat and looked at my stomach while I was in the hospital in December." Nurse advised patient that once chart reviewed thoroughly, she would be called back with the name of the GI doctor. After reviewing chart, there is an upper endoscopy procedure noted performed by Dr. Kennedy Bucker, MD. Patient informed.

## 2013-03-08 ENCOUNTER — Ambulatory Visit: Payer: Medicare Other | Admitting: Nurse Practitioner

## 2013-03-12 ENCOUNTER — Ambulatory Visit (INDEPENDENT_AMBULATORY_CARE_PROVIDER_SITE_OTHER): Payer: Medicare Other | Admitting: Nurse Practitioner

## 2013-03-12 ENCOUNTER — Encounter: Payer: Self-pay | Admitting: Nurse Practitioner

## 2013-03-12 VITALS — BP 172/80 | HR 65 | Ht 61.0 in | Wt 140.0 lb

## 2013-03-12 DIAGNOSIS — K5900 Constipation, unspecified: Secondary | ICD-10-CM | POA: Insufficient documentation

## 2013-03-12 DIAGNOSIS — N949 Unspecified condition associated with female genital organs and menstrual cycle: Secondary | ICD-10-CM

## 2013-03-12 DIAGNOSIS — N898 Other specified noninflammatory disorders of vagina: Secondary | ICD-10-CM | POA: Insufficient documentation

## 2013-03-12 DIAGNOSIS — R109 Unspecified abdominal pain: Secondary | ICD-10-CM | POA: Insufficient documentation

## 2013-03-12 DIAGNOSIS — R112 Nausea with vomiting, unspecified: Secondary | ICD-10-CM

## 2013-03-12 MED ORDER — POLYETHYLENE GLYCOL 3350 17 GM/SCOOP PO POWD: 17.0000 g | Freq: Every day | ORAL | Status: DC

## 2013-03-12 MED ORDER — DEXLANSOPRAZOLE 60 MG PO CPDR: DELAYED_RELEASE_CAPSULE | ORAL | Status: DC

## 2013-03-12 NOTE — Patient Instructions (Addendum)
Please, schedule an office visit with your primary care doctor to evaluate vaginal discharge. Please do this prior to coming back to see Dr. Fuller Plan. You have been given samples of Dexilant. Please, take one by mouth 30 minutes prior to breakfast and supper.  Take Miralax 17 grams daily.  You have been scheduled for a follow up office visit on 04/08/13 at 2:15 PM with Dr. Fuller Plan. Please, let us know before 5 PM on 04/07/13 if you cannot keep this appointment to avoid a late cancellation fee.

## 2013-03-12 NOTE — Progress Notes (Signed)
History of Present Illness:  Patient is a 72 year old female with multiple medical problems including, but not limited to PVD, coronary artery disease / DES of the CFX and diagonal artery June 2013,  ovarian cancer, and ESRD on hemodialysis. She has a history of hypertension requiring multiple antihypertensives . Patient is on multiple medications including Brillinta. She has multiple medication allergies. Patient is known to Dr. Fuller Plan for history of upper GI bleeding secondary to an intestinal AVM. She underwent EGD July 2013 with APC of a bleeding AVM in the descending duodenum.  Patient here today with multiple GI issues .She gives a two month history of nausea and vomiting which occurs at random times and often wakes her up at night. Emesis consists of non-bloody liquid. Over the last month or so she has developed problems with constipation consisting of decreased urge to defecate. Patient takes iron tablets which interestingly help her to defecate. Patient doesn't think she has started any new meds in the last month or so. Over the last week she developed lower abdominal pain. Pain constant, not related to meals. Weight stable. Patient reports having had a colonoscopy some time last year in ED in [place]. She reports removal of 2 polyps but is not sure why the procedure was done.  Patient has been on Protonix since CABG in December. Because of above symptoms her PCP increased PPI to twice daily but she didn't notice any improvement. Furthermore, she broke out in a rash following her last refill of Protonix so she has discontinued the medication.  She takes a baby asa, no other NSAIDS  Ms. Maller reports development of a vaginal odor and yellowish discharge over the last two months.She wonders if this is causing some of her lower abdominal discomfort.  She is embarrassed / bothered by this odor.  Current Medications, Allergies, Past Medical History, Past Surgical History, Family History and  Social History were reviewed in [employer] record.  Physical Exam: General: Well developed , black female in no acute distress Head: Normocephalic and atraumatic Eyes:  sclerae anicteric, conjunctiva pink  Ears: Normal auditory acuity Lungs: Clear throughout to auscultation Heart: Regular rate and rhythm Abdomen: Soft, non distended, non-tender. No masses, no hepatomegaly. Normal bowel sounds Rectal: light brown, heme negative stool in vault Musculoskeletal: Symmetrical with no gross deformities  Extremities: No edema . LUE AV graft thrill Neurological: Alert oriented x 4, grossly nonfocal Psychological:  Alert and cooperative. Normal mood and affect  Colonoscopy Done in [place], [place] by Dr. Britta Mccreedy 08/04/11 for evaluation of Hemoccult-positive stools and anemia. The extent of the exam was to the cecum, bowel prep was good. Two cecal polyps and one descending colon polyp were removed. Diverticulosis was found throughout the colon, no other findings.   Assessment and Recommendations:  57. 72 year old female presenting with multiple gastrointestinal issues. Over the last 2 months patient has developed intermittent nausea and vomiting , constipation , abdominal bloating, and lower abdominal pain. Constipation could be contributing to some of her other symptoms. Additionally patient gives a two-month history of malodorous vaginal discharge so lower abdominal pain could be GYN in nature. No unexplained weight loss, abdominal exam is not overly concerning, patient looks quite well today. To begin   patient to begin daily Miralax for constipation.  she will contact PCP about evaluation of GYN issues.  will try her on a different PPI (samples given).   patient will return to clinic in a few weeks for reevaluation.  2. History of  adenomatous polyps Jan 2013 (see findings above findings)  3. History of bleeding duodenal AVM, s/p EGD with control of bleeding July 2013  4.  Multiple significant medical problems including but not limited to CAD / stent placement June 2013, ESRD on HD, HTN, PVD  5. Chronic blood thinner use, on Brillinta  6. Multiple medication allergies.

## 2013-03-13 NOTE — Progress Notes (Signed)
Reviewed and agree with management plan.  Cristella Stiver T. Rodneisha Bonnet, MD FACG 

## 2013-03-21 ENCOUNTER — Inpatient Hospital Stay (HOSPITAL_COMMUNITY): Payer: PRIVATE HEALTH INSURANCE

## 2013-03-21 ENCOUNTER — Encounter (HOSPITAL_COMMUNITY): Payer: Self-pay | Admitting: *Deleted

## 2013-03-21 ENCOUNTER — Encounter (HOSPITAL_COMMUNITY)
Admission: EM | Disposition: A | Discharge status: Home / Self Care | Payer: Self-pay | Source: Home / Self Care | Attending: Internal Medicine

## 2013-03-21 ENCOUNTER — Inpatient Hospital Stay (HOSPITAL_COMMUNITY)
Admission: EM | Admit: 2013-03-21 | Discharge: 2013-03-27 | DRG: 377 | Disposition: A | Discharge status: Home / Self Care | Payer: PRIVATE HEALTH INSURANCE | Attending: Internal Medicine | Admitting: Internal Medicine

## 2013-03-21 DIAGNOSIS — N186 End stage renal disease: Secondary | ICD-10-CM | POA: Diagnosis present

## 2013-03-21 DIAGNOSIS — I779 Disorder of arteries and arterioles, unspecified: Secondary | ICD-10-CM

## 2013-03-21 DIAGNOSIS — Z79899 Other long term (current) drug therapy: Secondary | ICD-10-CM

## 2013-03-21 DIAGNOSIS — Z9861 Coronary angioplasty status: Secondary | ICD-10-CM

## 2013-03-21 DIAGNOSIS — K922 Gastrointestinal hemorrhage, unspecified: Secondary | ICD-10-CM

## 2013-03-21 DIAGNOSIS — I10 Essential (primary) hypertension: Secondary | ICD-10-CM | POA: Diagnosis present

## 2013-03-21 DIAGNOSIS — Y9210 Unspecified residential institution as the place of occurrence of the external cause: Secondary | ICD-10-CM | POA: Diagnosis not present

## 2013-03-21 DIAGNOSIS — I5022 Chronic systolic (congestive) heart failure: Secondary | ICD-10-CM | POA: Diagnosis present

## 2013-03-21 DIAGNOSIS — Z85038 Personal history of other malignant neoplasm of large intestine: Secondary | ICD-10-CM

## 2013-03-21 DIAGNOSIS — I059 Rheumatic mitral valve disease, unspecified: Secondary | ICD-10-CM | POA: Diagnosis present

## 2013-03-21 DIAGNOSIS — Y849 Medical procedure, unspecified as the cause of abnormal reaction of the patient, or of later complication, without mention of misadventure at the time of the procedure: Secondary | ICD-10-CM | POA: Diagnosis not present

## 2013-03-21 DIAGNOSIS — K279 Peptic ulcer, site unspecified, unspecified as acute or chronic, without hemorrhage or perforation: Secondary | ICD-10-CM | POA: Diagnosis present

## 2013-03-21 DIAGNOSIS — E871 Hypo-osmolality and hyponatremia: Secondary | ICD-10-CM | POA: Diagnosis not present

## 2013-03-21 DIAGNOSIS — I214 Non-ST elevation (NSTEMI) myocardial infarction: Secondary | ICD-10-CM

## 2013-03-21 DIAGNOSIS — Z951 Presence of aortocoronary bypass graft: Secondary | ICD-10-CM

## 2013-03-21 DIAGNOSIS — T8089XA Other complications following infusion, transfusion and therapeutic injection, initial encounter: Secondary | ICD-10-CM | POA: Diagnosis not present

## 2013-03-21 DIAGNOSIS — I5032 Chronic diastolic (congestive) heart failure: Secondary | ICD-10-CM | POA: Diagnosis present

## 2013-03-21 DIAGNOSIS — Z7901 Long term (current) use of anticoagulants: Secondary | ICD-10-CM

## 2013-03-21 DIAGNOSIS — K920 Hematemesis: Secondary | ICD-10-CM | POA: Diagnosis present

## 2013-03-21 DIAGNOSIS — K5520 Angiodysplasia of colon without hemorrhage: Secondary | ICD-10-CM

## 2013-03-21 DIAGNOSIS — Z8673 Personal history of transient ischemic attack (TIA), and cerebral infarction without residual deficits: Secondary | ICD-10-CM

## 2013-03-21 DIAGNOSIS — K219 Gastro-esophageal reflux disease without esophagitis: Secondary | ICD-10-CM | POA: Diagnosis present

## 2013-03-21 DIAGNOSIS — R112 Nausea with vomiting, unspecified: Secondary | ICD-10-CM | POA: Diagnosis present

## 2013-03-21 DIAGNOSIS — D649 Anemia, unspecified: Secondary | ICD-10-CM

## 2013-03-21 DIAGNOSIS — K31819 Angiodysplasia of stomach and duodenum without bleeding: Secondary | ICD-10-CM | POA: Diagnosis present

## 2013-03-21 DIAGNOSIS — E782 Mixed hyperlipidemia: Secondary | ICD-10-CM | POA: Diagnosis present

## 2013-03-21 DIAGNOSIS — I120 Hypertensive chronic kidney disease with stage 5 chronic kidney disease or end stage renal disease: Secondary | ICD-10-CM | POA: Diagnosis present

## 2013-03-21 DIAGNOSIS — Z8543 Personal history of malignant neoplasm of ovary: Secondary | ICD-10-CM

## 2013-03-21 DIAGNOSIS — I509 Heart failure, unspecified: Secondary | ICD-10-CM | POA: Diagnosis present

## 2013-03-21 DIAGNOSIS — D62 Acute posthemorrhagic anemia: Secondary | ICD-10-CM

## 2013-03-21 DIAGNOSIS — I252 Old myocardial infarction: Secondary | ICD-10-CM

## 2013-03-21 DIAGNOSIS — R079 Chest pain, unspecified: Secondary | ICD-10-CM

## 2013-03-21 DIAGNOSIS — I5042 Chronic combined systolic (congestive) and diastolic (congestive) heart failure: Secondary | ICD-10-CM | POA: Diagnosis present

## 2013-03-21 DIAGNOSIS — I340 Nonrheumatic mitral (valve) insufficiency: Secondary | ICD-10-CM | POA: Diagnosis present

## 2013-03-21 DIAGNOSIS — Z7982 Long term (current) use of aspirin: Secondary | ICD-10-CM

## 2013-03-21 DIAGNOSIS — R109 Unspecified abdominal pain: Secondary | ICD-10-CM | POA: Diagnosis present

## 2013-03-21 DIAGNOSIS — R110 Nausea: Secondary | ICD-10-CM

## 2013-03-21 DIAGNOSIS — Z992 Dependence on renal dialysis: Secondary | ICD-10-CM

## 2013-03-21 DIAGNOSIS — M899 Disorder of bone, unspecified: Secondary | ICD-10-CM | POA: Diagnosis present

## 2013-03-21 DIAGNOSIS — E785 Hyperlipidemia, unspecified: Secondary | ICD-10-CM | POA: Diagnosis present

## 2013-03-21 DIAGNOSIS — I498 Other specified cardiac arrhythmias: Secondary | ICD-10-CM | POA: Diagnosis not present

## 2013-03-21 DIAGNOSIS — K921 Melena: Principal | ICD-10-CM | POA: Diagnosis present

## 2013-03-21 DIAGNOSIS — M129 Arthropathy, unspecified: Secondary | ICD-10-CM | POA: Diagnosis present

## 2013-03-21 DIAGNOSIS — I2510 Atherosclerotic heart disease of native coronary artery without angina pectoris: Secondary | ICD-10-CM | POA: Diagnosis present

## 2013-03-21 HISTORY — DX: End stage renal disease: Z99.2

## 2013-03-21 HISTORY — DX: End stage renal disease: N18.6

## 2013-03-21 LAB — TRANSFUSION REACTION: DAT C3: NEGATIVE

## 2013-03-21 LAB — COMPREHENSIVE METABOLIC PANEL
Albumin: 3.2 g/dL — ABNORMAL LOW (ref 3.5–5.2)
BUN: 68 mg/dL — ABNORMAL HIGH (ref 6–23)
Creatinine, Ser: 7.43 mg/dL — ABNORMAL HIGH (ref 0.50–1.10)
Total Protein: 6 g/dL (ref 6.0–8.3)

## 2013-03-21 LAB — CBC WITH DIFFERENTIAL/PLATELET
Basophils Absolute: 0 10*3/uL (ref 0.0–0.1)
Basophils Relative: 0 % (ref 0–1)
Eosinophils Absolute: 0.1 10*3/uL (ref 0.0–0.7)
MCH: 30.8 pg (ref 26.0–34.0)
MCHC: 33.3 g/dL (ref 30.0–36.0)
Monocytes Relative: 4 % (ref 3–12)
Neutrophils Relative %: 85 % — ABNORMAL HIGH (ref 43–77)
Platelets: 215 10*3/uL (ref 150–400)
RDW: 17.2 % — ABNORMAL HIGH (ref 11.5–15.5)

## 2013-03-21 LAB — HEMATOCRIT: HCT: 24.7 % — ABNORMAL LOW (ref 36.0–46.0)

## 2013-03-21 LAB — CBC
HCT: 24.6 % — ABNORMAL LOW (ref 36.0–46.0)
MCHC: 33.3 g/dL (ref 30.0–36.0)
MCV: 92.5 fL (ref 78.0–100.0)
Platelets: 233 10*3/uL (ref 150–400)
RDW: 17.2 % — ABNORMAL HIGH (ref 11.5–15.5)

## 2013-03-21 LAB — TROPONIN I: Troponin I: 0.34 ng/mL (ref <0.30)

## 2013-03-21 LAB — MRSA PCR SCREENING: MRSA by PCR: NEGATIVE

## 2013-03-21 LAB — HEMOGLOBIN: Hemoglobin: 8.1 g/dL — ABNORMAL LOW (ref 12.0–15.0)

## 2013-03-21 IMAGING — CR DG ABDOMEN 2V
2 series · 2 of 2 positions shown · non-contrast
Comparison: [DATE].

CLINICAL DATA: Abdominal pain.  Diarrhea.

ABDOMEN - 2 VIEW

[w abdomen upright]
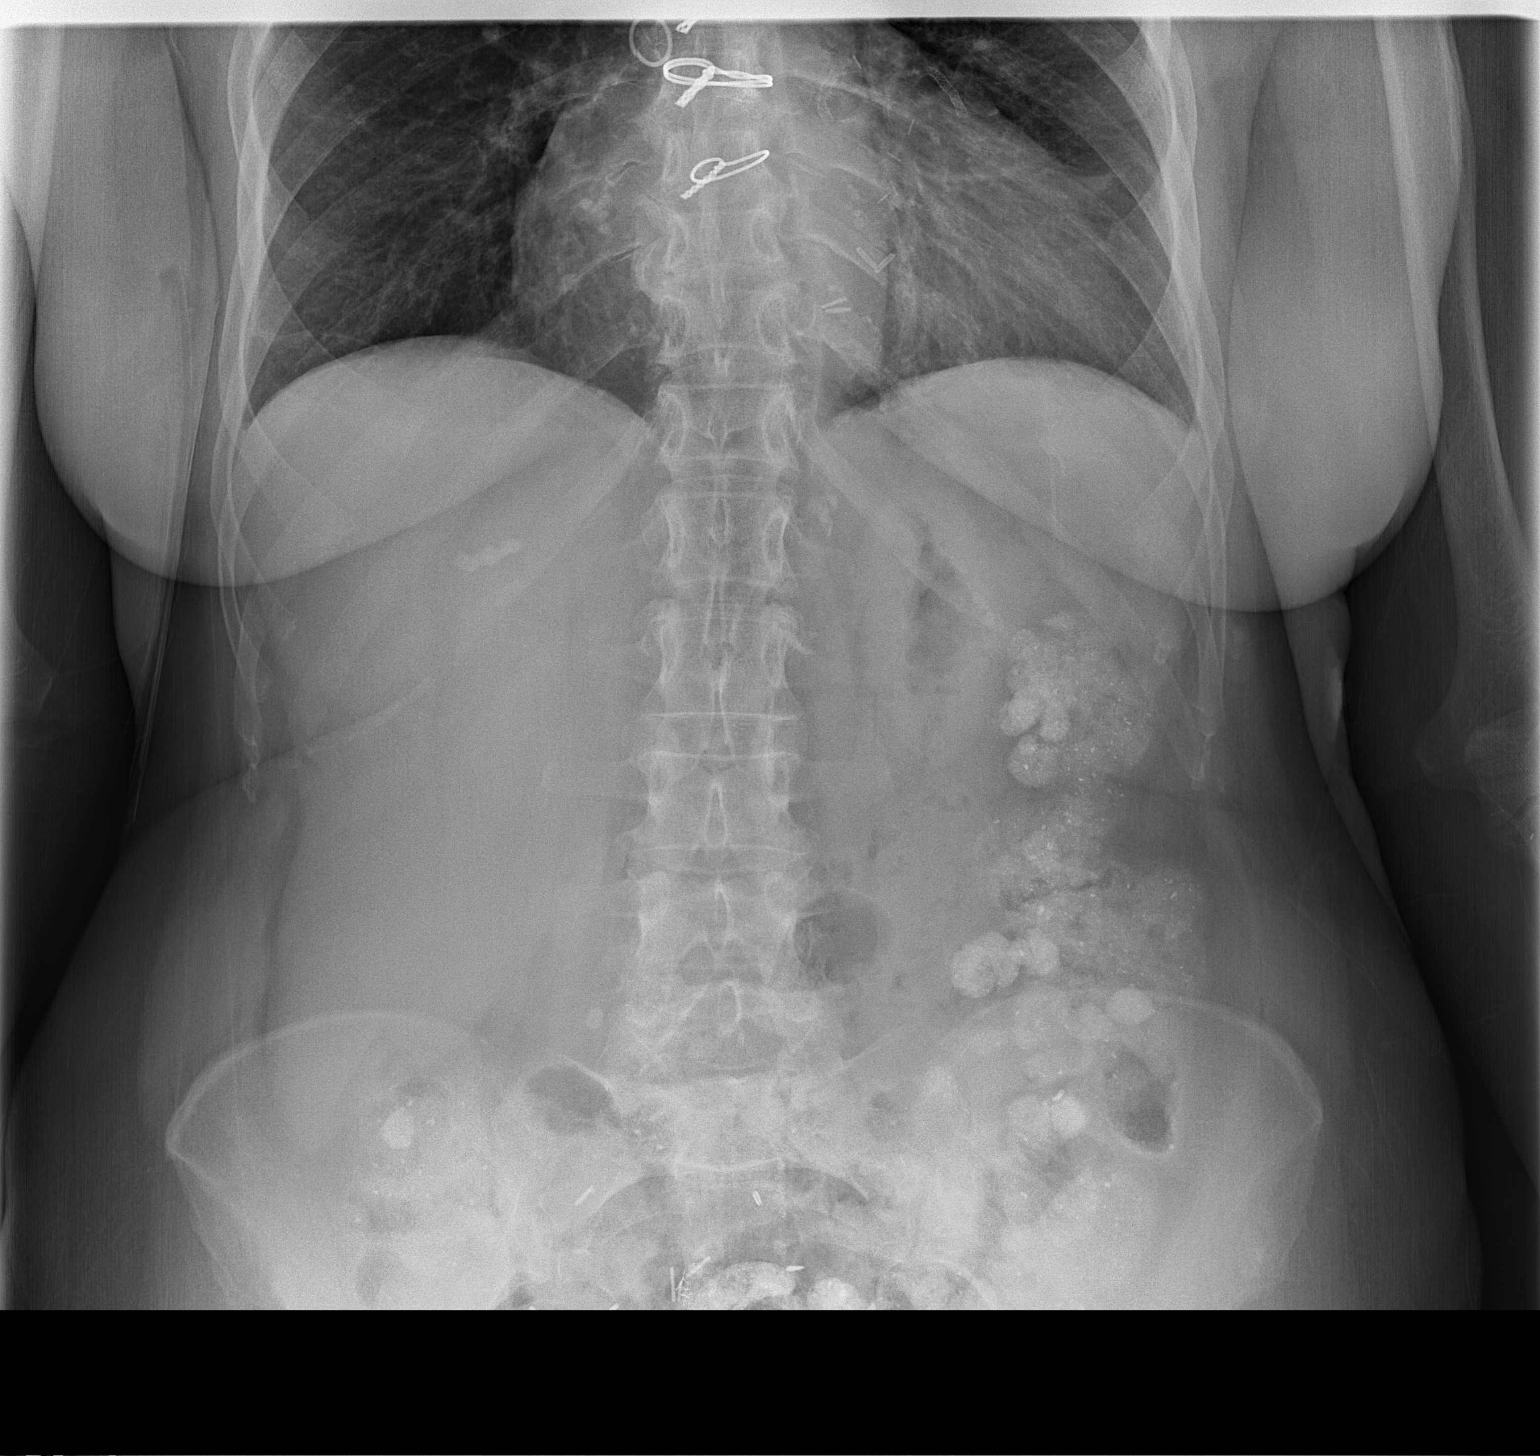

[t abdomen supine]
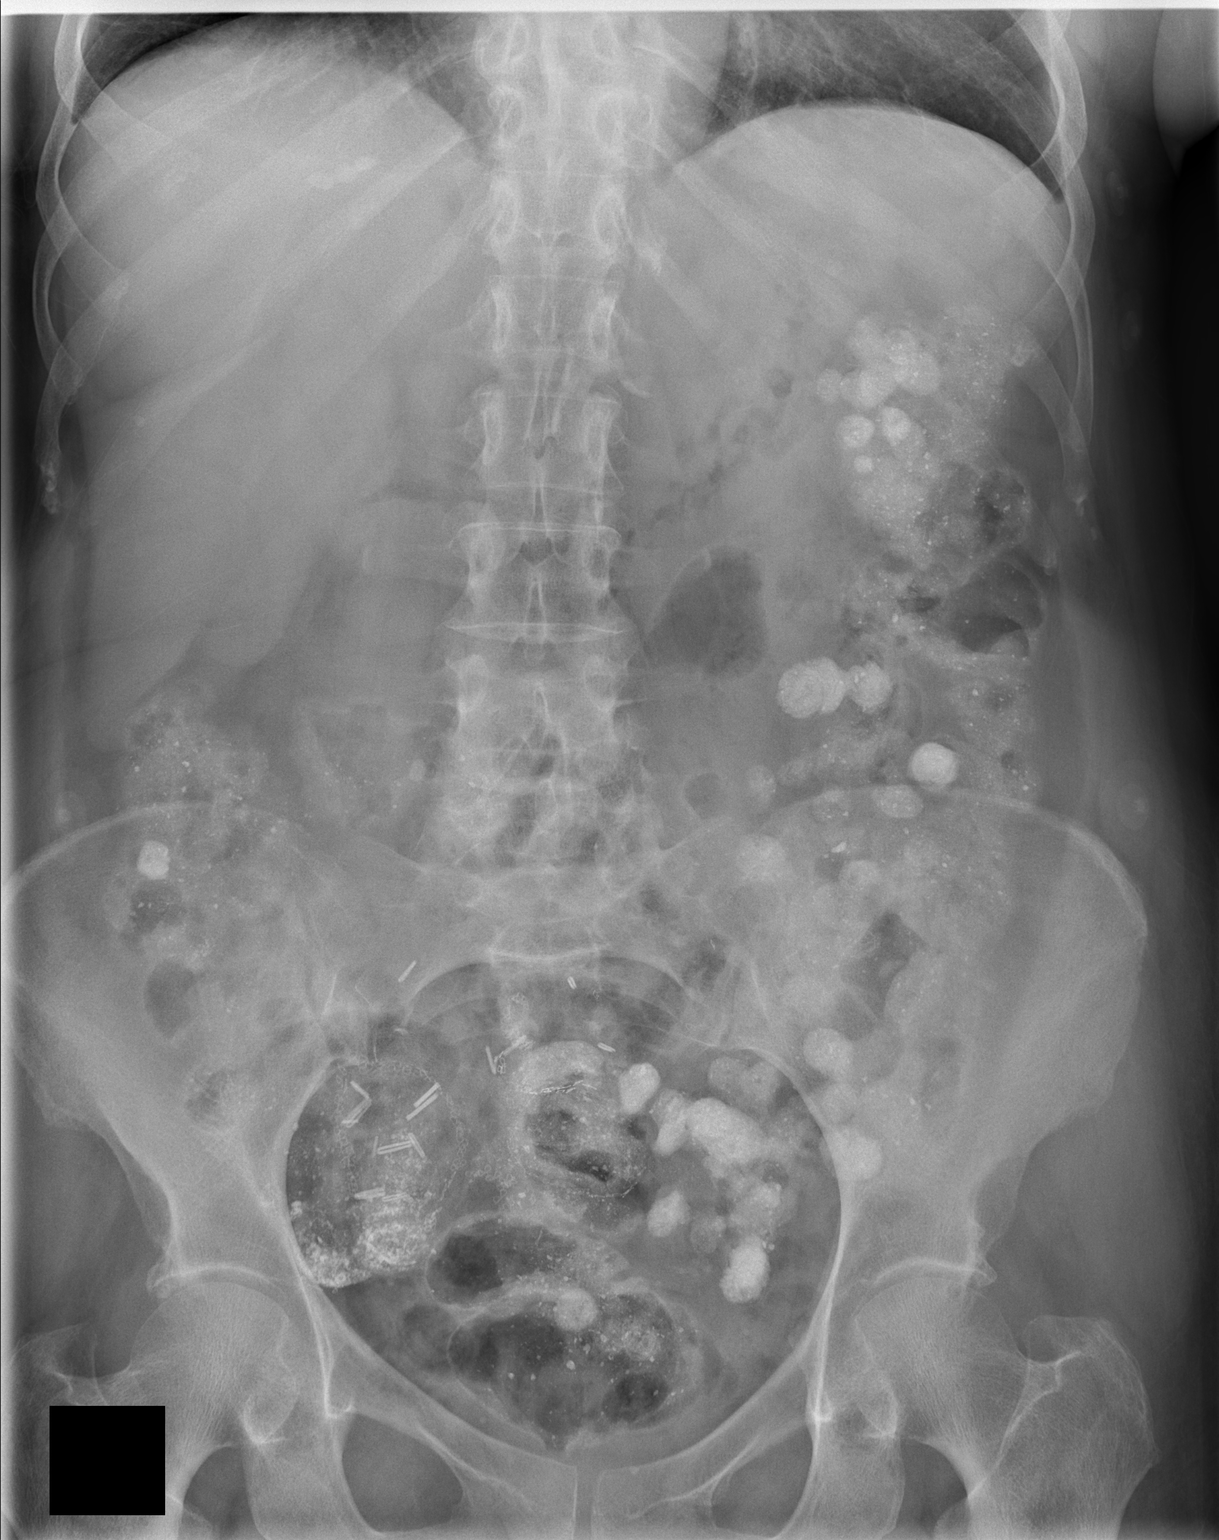

[2 of 2 positions shown; findings below may reference images not displayed]

FINDINGS: Radiopaque material throughout the colon most likely
related to ingest material with partial filling of the diverticula.
No plain film evidence of bowel obstruction or free intraperitoneal
air.

Multiple surgical clips within the pelvis greater to the right.

Cardiomegaly post CABG with coronary artery calcification.

Radiopaque structure projects over the upper liver which may be
related to ribs although parenchymal calcification not excluded.
IMPRESSION: Colonic diverticula.

No plain film evidence of bowel obstruction or free intraperitoneal
air.

Please see above.

## 2013-03-21 SURGERY — EGD (ESOPHAGOGASTRODUODENOSCOPY)
Anesthesia: Moderate Sedation

## 2013-03-21 MED ORDER — DIPHENHYDRAMINE HCL 50 MG/ML IJ SOLN
INTRAMUSCULAR | Status: AC
  Filled 2013-03-21: qty 1

## 2013-03-21 MED ORDER — FAMOTIDINE IN NACL 20-0.9 MG/50ML-% IV SOLN
20.0000 mg | INTRAVENOUS | Status: DC
  Administered 2013-03-22: 20 mg via INTRAVENOUS
  Filled 2013-03-21 (×3): qty 50

## 2013-03-21 MED ORDER — ALPRAZOLAM 0.5 MG PO TABS
0.5000 mg | ORAL_TABLET | Freq: Three times a day (TID) | ORAL | Status: DC
  Administered 2013-03-21 – 2013-03-27 (×16): 0.5 mg via ORAL
  Filled 2013-03-21 (×16): qty 1

## 2013-03-21 MED ORDER — ONDANSETRON HCL 4 MG/2ML IJ SOLN
4.0000 mg | Freq: Once | INTRAMUSCULAR | Status: AC
  Administered 2013-03-21: 4 mg via INTRAVENOUS
  Filled 2013-03-21: qty 2

## 2013-03-21 MED ORDER — SODIUM CHLORIDE 0.9 % IV SOLN
80.0000 mg | Freq: Once | INTRAVENOUS | Status: DC
  Filled 2013-03-21: qty 80

## 2013-03-21 MED ORDER — DIPHENHYDRAMINE HCL 50 MG/ML IJ SOLN
25.0000 mg | Freq: Once | INTRAMUSCULAR | Status: AC
  Administered 2013-03-21: 25 mg via INTRAVENOUS
  Filled 2013-03-21: qty 1

## 2013-03-21 MED ORDER — ACETAMINOPHEN 650 MG RE SUPP: 650.0000 mg | Freq: Four times a day (QID) | RECTAL | Status: DC | PRN

## 2013-03-21 MED ORDER — CINACALCET HCL 30 MG PO TABS
30.0000 mg | ORAL_TABLET | Freq: Every day | ORAL | Status: DC
  Administered 2013-03-22 – 2013-03-27 (×6): 30 mg via ORAL
  Filled 2013-03-21 (×7): qty 1

## 2013-03-21 MED ORDER — SODIUM CHLORIDE 0.9 % IJ SOLN
3.0000 mL | Freq: Two times a day (BID) | INTRAMUSCULAR | Status: DC
  Administered 2013-03-21 – 2013-03-27 (×10): 3 mL via INTRAVENOUS

## 2013-03-21 MED ORDER — CLONIDINE HCL 0.3 MG PO TABS
0.3000 mg | ORAL_TABLET | Freq: Every day | ORAL | Status: DC
  Administered 2013-03-22: 0.3 mg via ORAL
  Filled 2013-03-21 (×3): qty 1

## 2013-03-21 MED ORDER — METHYLPREDNISOLONE SODIUM SUCC 125 MG IJ SOLR
60.0000 mg | Freq: Once | INTRAMUSCULAR | Status: AC
  Administered 2013-03-21: 60 mg via INTRAVENOUS
  Filled 2013-03-21: qty 2

## 2013-03-21 MED ORDER — ALBUTEROL SULFATE HFA 108 (90 BASE) MCG/ACT IN AERS
2.0000 | INHALATION_SPRAY | Freq: Four times a day (QID) | RESPIRATORY_TRACT | Status: DC | PRN
  Filled 2013-03-21: qty 6.7

## 2013-03-21 MED ORDER — FENTANYL CITRATE 0.05 MG/ML IJ SOLN
INTRAMUSCULAR | Status: AC
  Filled 2013-03-21: qty 2

## 2013-03-21 MED ORDER — METHYLPREDNISOLONE SODIUM SUCC 125 MG IJ SOLR
60.0000 mg | Freq: Once | INTRAMUSCULAR | Status: AC
  Administered 2013-03-21: 60 mg via INTRAVENOUS

## 2013-03-21 MED ORDER — PANTOPRAZOLE SODIUM 40 MG IV SOLR
40.0000 mg | Freq: Two times a day (BID) | INTRAVENOUS | Status: DC
  Filled 2013-03-21 (×2): qty 40

## 2013-03-21 MED ORDER — TOBRAMYCIN-DEXAMETHASONE 0.3-0.1 % OP SUSP
1.0000 [drp] | Freq: Four times a day (QID) | OPHTHALMIC | Status: DC
  Administered 2013-03-21 – 2013-03-26 (×19): 1 [drp] via OPHTHALMIC
  Filled 2013-03-21: qty 2.5

## 2013-03-21 MED ORDER — MIDAZOLAM HCL 5 MG/ML IJ SOLN
INTRAMUSCULAR | Status: AC
  Filled 2013-03-21: qty 2

## 2013-03-21 MED ORDER — NITROGLYCERIN 0.4 MG SL SUBL
0.4000 mg | SUBLINGUAL_TABLET | SUBLINGUAL | Status: DC | PRN
  Administered 2013-03-22 – 2013-03-24 (×2): 0.4 mg via SUBLINGUAL
  Filled 2013-03-21 (×2): qty 25

## 2013-03-21 MED ORDER — AMLODIPINE BESYLATE 10 MG PO TABS
10.0000 mg | ORAL_TABLET | Freq: Every day | ORAL | Status: DC
  Administered 2013-03-22: 10 mg via ORAL
  Filled 2013-03-21 (×3): qty 1

## 2013-03-21 MED ORDER — MIDAZOLAM HCL 5 MG/ML IJ SOLN
INTRAMUSCULAR | Status: AC
  Filled 2013-03-21: qty 3

## 2013-03-21 MED ORDER — MORPHINE SULFATE 2 MG/ML IJ SOLN
2.0000 mg | INTRAMUSCULAR | Status: DC | PRN
  Administered 2013-03-22 (×2): 2 mg via INTRAVENOUS
  Filled 2013-03-21 (×2): qty 1

## 2013-03-21 MED ORDER — LABETALOL HCL 200 MG PO TABS
200.0000 mg | ORAL_TABLET | Freq: Every day | ORAL | Status: DC
  Administered 2013-03-23 – 2013-03-24 (×2): 200 mg via ORAL
  Filled 2013-03-21 (×4): qty 1

## 2013-03-21 MED ORDER — ACETAMINOPHEN 325 MG PO TABS
650.0000 mg | ORAL_TABLET | Freq: Four times a day (QID) | ORAL | Status: DC | PRN
  Administered 2013-03-22: 650 mg via ORAL

## 2013-03-21 MED ORDER — HYDROCODONE-ACETAMINOPHEN 5-325 MG PO TABS
1.0000 | ORAL_TABLET | ORAL | Status: DC | PRN
  Administered 2013-03-25: 1 via ORAL
  Filled 2013-03-21: qty 1

## 2013-03-21 MED ORDER — HYDRALAZINE HCL 25 MG PO TABS
25.0000 mg | ORAL_TABLET | Freq: Two times a day (BID) | ORAL | Status: DC
  Administered 2013-03-21 – 2013-03-23 (×2): 25 mg via ORAL
  Filled 2013-03-21 (×6): qty 1

## 2013-03-21 MED ORDER — FENTANYL CITRATE 0.05 MG/ML IJ SOLN
INTRAMUSCULAR | Status: AC
  Filled 2013-03-21: qty 4

## 2013-03-21 MED ORDER — SODIUM CHLORIDE 0.9 % IV SOLN
8.0000 mg/h | INTRAVENOUS | Status: DC
  Filled 2013-03-21 (×2): qty 80

## 2013-03-21 MED ORDER — FAMOTIDINE 200 MG/20ML IV SOLN
1.0000 mg/h | INTRAVENOUS | Status: DC
  Administered 2013-03-21: 1 mg/h via INTRAVENOUS
  Filled 2013-03-21: qty 4

## 2013-03-21 MED ORDER — ATORVASTATIN CALCIUM 20 MG PO TABS
20.0000 mg | ORAL_TABLET | Freq: Every day | ORAL | Status: DC
  Administered 2013-03-22 – 2013-03-26 (×5): 20 mg via ORAL
  Filled 2013-03-21 (×7): qty 1

## 2013-03-21 NOTE — ED Notes (Signed)
Immediately as ffp rate was increased to 600, pt began stating her arm itched.  FFP stopped immediately.  Pt began saying she felt tingling around her mouth.  EDP called and admitting md called.  Benadryl given.

## 2013-03-21 NOTE — ED Notes (Signed)
PA for Gastroenterology at bedside.

## 2013-03-21 NOTE — Progress Notes (Signed)
[facility] PROGRESS NOTE  Shawna [name]  [id number]  DOB: 20-Mar-1941  DOA: 03/21/2013 PCP: [name], MD  HPI/Subjective: Presented with blood per rectum, bright red vs dark maroon. Has some upper GI discomfort. Prior h/o CAD/CABG,ESRD on HD, Upper GI AVM, last EGD June 2013.  Assessment and Plan: Pt accepted to be admitted to step down unit with concern for on going GI bleed. GI will be called by the ER physician and further recommendation awaited. Considering her h/o upper GI AVM reccommended protonix drip. Pt will get 1 unit of blood transfusion. May need to discuss with cardiology about the further use of brillinta. Her CABG  was in 05/2012.  Author: Berle Mull, MD [facility] Pager: [phone] 03/21/2013 6:38 AM   If 7PM-7AM, please contact night-coverage at [email or web address], password [facility]

## 2013-03-21 NOTE — Consult Note (Signed)
PULMONARY  / CRITICAL CARE MEDICINE  Name: Shawna [name] MRN: [id number] DOB: 11-Sep-1940    ADMISSION DATE:  03/21/2013 CONSULTATION DATE:  03/21/13  REFERRING MD :  Coralyn Pear  PRIMARY SERVICE:  [facility]  CHIEF COMPLAINT:  GI bleed, blood reaction   BRIEF PATIENT DESCRIPTION: 71 yo female with hx ESRD, CAD s/p CABG 2013 on Brilinta, HTN, CHF presented 9/11 with 2 day hx epigastric pain, bloody stools.  In ER was being transfused with FFP and became hypotensive, tachycardic, diaphoretic when transfusion started.  PCCM consulted for ?ICU monitoring and IV access.   SIGNIFICANT EVENTS / STUDIES:  9/11 transfusion reaction   LINES / TUBES:   CULTURES:   ANTIBIOTICS:   HISTORY OF PRESENT ILLNESS:  72 yo female with hx ESRD, CAD s/p CABG 2013 on Brilinta, HTN, CHF presented 9/11 with 2 day hx epigastric pain, bloody stools, weakness.  In ER was being transfused with FFP and became hypotensive, tachycardic, wheezing, diaphoretic when transfusion started.  She improved quickly with IV fluids and stopping transfusion but cont to have mild chest/back pain and epigastric pain.  PCCM consulted for ?ICU monitoring and IV access.    PAST MEDICAL HISTORY :  Past Medical History  Diagnosis Date  . Essential hypertension, benign   . Chronic bronchitis   . GERD (gastroesophageal reflux disease)   . PUD (peptic ulcer disease)   . History of lower GI bleeding   . Arthritis   . History of gout   . CAD (coronary artery disease)     a. 12/2011 NSTEMI/Cath/PCI LCX (2.25x14 Resolute DES) & D1 (2.25x22 Resolute DES);  b. 01/2012 Cath/PCI: LM 30, LAD 30p, 40-60m, D1 stent ok, 99 in sm branch of diag, LCX patent stent, OM1 20, RCA 95 ost (4.0x12 Promus DES), EF 55%;  c. 04/2012 Lexi Cardiolite  EF 48%, small area of scar @ base/mid inflat wall with mild peri-infarct ischemia.; CABG 12/4  . High cholesterol 12/2011  . Pneumonia ~ 2009  . Iron deficiency anemia   . TIA (transient ischemic attack)   . Anxiety   .  History of blood transfusion 07/2011; 12/2011; 01/2012 X 2; 04/2012  . Carotid artery disease     a. [id number] LICA, [id number]   . Mitral regurgitation     a. Moderate by echo, 02/2012  . Myocardial infarction   . ESRD (end stage renal disease) on dialysis 12/15/11    a. HD @ [facility]; Mon, Wed, Fri  . Ovarian cancer 1992  . Colon cancer 1992  . Chronic diastolic CHF (congestive heart failure)     a. 02/2012 Echo EF 60-65%, nl wall motion, Gr 1 DD, mod MR   Past Surgical History  Procedure Laterality Date  . Abdominal hysterectomy  1992  . Appendectomy  06/1990  . Tubal ligation  1980's  . Av fistula placement  07/2009    left upper arm  . Thrombectomy / arteriovenous graft revision  2011    left upper arm  . Colon resection  1992  . Esophagogastroduodenoscopy  01/20/2012    Procedure: ESOPHAGOGASTRODUODENOSCOPY (EGD);  Surgeon: Ladene Artist, MD,FACG;  Location: [facility] ENDOSCOPY;  Service: Endoscopy;  Laterality: N/A;  . Dilation and curettage of uterus    . Coronary angioplasty with stent placement  12/15/11    "2"  . Coronary angioplasty with stent placement  y/2013    "1; makes total of 3" (05/02/2012)  . Coronary artery bypass graft  06/13/2012    Procedure: CORONARY ARTERY BYPASS  GRAFTING (CABG);  Surgeon: Grace Isaac, MD;  Location: [place];  Service: Open Heart Surgery;  Laterality: N/A;  cabg x four;  using left internal mammary artery, and left leg greater saphenous vein harvested endoscopically  . Intraoperative transesophageal echocardiogram  06/13/2012    Procedure: INTRAOPERATIVE TRANSESOPHAGEAL ECHOCARDIOGRAM;  Surgeon: Grace Isaac, MD;  Location: [place];  Service: Open Heart Surgery;  Laterality: N/A;  . Colectomy  1992   Prior to Admission medications   Medication Sig Start Date End Date Taking? Authorizing Provider  ALPRAZolam Duanne Moron) 0.5 MG tablet Take 0.5 mg by mouth 3 (three) times daily.   Yes Historical Provider, MD  amLODipine (NORVASC) 10 MG tablet Take 10 mg by  mouth daily.   Yes Historical Provider, MD  atorvastatin (LIPITOR) 20 MG tablet Take 20 mg by mouth daily.   Yes Historical Provider, MD  cloNIDine (CATAPRES) 0.3 MG tablet Take 0.3 mg by mouth daily.   Yes Historical Provider, MD  dexlansoprazole (DEXILANT) 60 MG capsule Take one po 30 minutes prior to breakfast and dinner. Lot [id number] 03/12/13  Yes Willia Craze, NP  fluticasone (FLONASE) 50 MCG/ACT nasal spray Place 2 sprays into the nose daily.   Yes Historical Provider, MD  folic acid (FOLVITE) 1 MG tablet Take 1 tablet (1 mg total) by mouth daily. 07/26/12  Yes Aurora Mask, PA-C  folic acid-vitamin b complex-vitamin c-selenium-zinc (DIALYVITE) 3 MG TABS Take 1 tablet by mouth daily.   Yes Historical Provider, MD  hydrALAZINE (APRESOLINE) 25 MG tablet Take 25 mg by mouth 2 (two) times daily.   Yes Historical Provider, MD  hydrOXYzine (ATARAX/VISTARIL) 25 MG tablet Take 25 mg by mouth daily as needed for itching.    Yes Historical Provider, MD  iron polysaccharides (NIFEREX) 150 MG capsule Take 150 mg by mouth daily.   Yes Historical Provider, MD  labetalol (NORMODYNE) 200 MG tablet Take 200 mg by mouth daily.   Yes Historical Provider, MD  lanthanum (FOSRENOL) 1000 MG chewable tablet Chew 1,000 mg by mouth 3 (three) times daily after meals.   Yes Historical Provider, MD  lidocaine-prilocaine (EMLA) cream Apply 1 application topically every Monday, Wednesday, and Friday.    Yes Historical Provider, MD  loratadine (CLARITIN) 10 MG tablet Take 10 mg by mouth daily.    Yes Historical Provider, MD  SENSIPAR 30 MG tablet Take 30 mg by mouth daily.  12/21/12  Yes Historical Provider, MD  tobramycin-dexamethasone [facility]) ophthalmic solution Place 1 drop into the left eye 4 (four) times daily.  09/28/12  Yes Historical Provider, MD  traMADol (ULTRAM) 50 MG tablet Take 50 mg by mouth every 6 (six) hours as needed for pain.  07/12/12  Yes Jessica A Hope, PA-C  albuterol (PROVENTIL HFA;VENTOLIN HFA) 108  (90 BASE) MCG/ACT inhaler Inhale 2 puffs into the lungs every 6 (six) hours as needed for wheezing or shortness of breath.     Historical Provider, MD  nitroGLYCERIN (NITROSTAT) 0.4 MG SL tablet Place 0.4 mg under the tongue every 5 (five) minutes as needed for chest pain. 10/04/12   Aurora Mask, PA-C  polyethylene glycol powder (GLYCOLAX/MIRALAX) powder Take 17 g by mouth daily. 03/12/13   Willia Craze, NP   Allergies  Allergen Reactions  . Aspirin Other (See Comments)    Mess up her stomach; "makes my bowels have blood in them". Takes 81 mg EC Aspirin   . Contrast Media [Iodinated Diagnostic Agents] Itching  . Iron Itching and Other (See Comments)    "  they gave me iron in dialysis; had to give me Benadryl cause I had to have the iron" (05/02/2012)  . Macrodantin [Nitrofurantoin Macrocrystal] Other (See Comments)    "broke me out in big old knots all over my body; had to go to ER"  . Penicillins Other (See Comments)    "makes me real weak when I take it; like I'll pass out"  . Plavix [Clopidogrel Bisulfate] Rash  . Bactrim [Sulfamethoxazole W-Trimethoprim] Rash  . Sulfa Antibiotics Rash  . Venofer [Ferric Oxide] Itching    Patient reports using Benadryl prior to doses as [place]:  Family History  Problem Relation Age of Onset  . Other      noncontributory for early CAD  . Heart disease Mother     Heart Disease before age 62  . Hyperlipidemia Mother   . Hypertension Mother   . Heart disease Father   . Hyperlipidemia Father   . Hypertension Father    SOCIAL HISTORY:  reports that she has never smoked. She has never used smokeless tobacco. She reports that she does not drink alcohol or use illicit drugs.  REVIEW OF SYSTEMS:   C/o epigastric pain/heartburn x 2 days, diarrhea that is intermittently bloody and at times black/tarry.  Denies fever, SOB, trauma.  ALl other systems reviewed and were neg.   VITAL SIGNS: Temp:  [98.4 F (36.9 C)-98.8 F  (37.1 C)] 98.7 F (37.1 C) (09/11 1023) Pulse Rate:  [73-90] 90 (09/11 1030) Resp:  [13-18] 18 (09/11 1023) BP: (146-200)/(49-84) 187/84 mmHg (09/11 1030) SpO2:  [96 %-100 %] 100 % (09/11 1030) Weight:  [140 lb (63.504 kg)] 140 lb (63.504 kg) (09/11 0436)  PHYSICAL EXAMINATION: General:  Chronically ill appearing female, mild discomfort  Neuro:  Awake, alert, appropriate, MAE HEENT:  Mm dry, no JVD Cardiovascular:  s1s2 rrr, no m/r/g Lungs:  resps even non labored on [place], no wheeze, cta  Abdomen:  Soft, mildly tender, +bs Musculoskeletal:  Warm and dry, no edema    Recent Labs Lab 03/21/13 0455  NA 138  K 4.3  CL 96  CO2 25  BUN 68*  CREATININE 7.43*  GLUCOSE 96    Recent Labs Lab 03/21/13 0455  HGB 8.2*  HCT 24.6*  WBC 6.9  PLT 233   Dg Abd 2 Views  03/21/2013   *RADIOLOGY REPORT*  Clinical Data: Abdominal pain.  Diarrhea.  ABDOMEN - 2 VIEW  Comparison: 12/24/2012.  Findings: Radiopaque material throughout the colon most likely related to ingest material with partial filling of the diverticula. No plain film evidence of bowel obstruction or free intraperitoneal air.  Multiple surgical clips within the pelvis greater to the right.  Cardiomegaly post CABG with coronary artery calcification.  Radiopaque structure projects over the upper liver which may be related to ribs although parenchymal calcification not excluded.  IMPRESSION: Colonic diverticula.  No plain film evidence of bowel obstruction or free intraperitoneal air.  Please see above.   Original Report Authenticated By: Genia Del, M.D.    ASSESSMENT / PLAN:  GI bleed  Epigastric/chest pain  Blood transfusion reaction (FFP) Blood loss anemia - mild  CAD - s/p CABG  ESRD   Pt with GI bleed, on Brilinta post CABG.  Had reaction to transfusion FFP, much improved with fluids, solumedrol, pepcid.  Does have some epigastric/chest pain but this has been ongoing since the GI bleed began.  Does have limited IV  access (ESRD) but is currently anticoagulated.  REC -  OK for SDU  GI to see - EGD today  Will hold off on placing CVL as she is anticoagulated and hemodynamically stable - if clinical status changes will place  Cont solumedrol, pepcid per primary  Gentle IV fluids  Trend troponins/12 lead EKG - if changes call cardiology Domenic Polite)  Hold Brilinta  No further FFP, not indicated for reversal in anticoagulants other than coumadin Hold off on further PRBC as well with hgb >8  Will need further crossmatching, etc prior to any other transfusion  Renal to see - MWF HD CBC q6     WHITEHEART,KATHRYN, NP 03/21/2013  12:10 PM Pager: [phone] or [phone]  *Care during the described time interval was provided by me and/or other providers on the critical care team. I have reviewed this patient's available data, including medical history, events of note, physical examination and test results as part of my evaluation.   Baltazar Apo, MD, PhD 03/22/2013, 8:37 AM [facility] [phone] or if no answer [phone]

## 2013-03-21 NOTE — Consult Note (Signed)
The patient was seen and examined, and I agree with the assessment and plan as documented above with some modifications. Patient with melena likely secondary to upper GI bleed (possible bleed from duodenal AVM seen previously) and subsequently became profoundly anemic. She has received 1 unit of PRBC's and is currently awaiting second unit. She had previously experienced some upper abdominal/lower chest discomfort and troponins were checked. It returned at 0.34 with subsequent troponin <0.3. Her ECG shows LVH with repolarization changes with no marked differences from previous ECG. She has a h/o multivessel stenting in July 2013 with subsequent CABG on 06/13/2012. ASA and Brilinta have been held. Given the current scenario, I recommend to continue to withhold all antiplatelet therapy. Her very mild troponin elevation (which has since normalized) was likely secondary to demand ischemia due to her marked anemia. I do not recommend checking any additional troponins at this time. She currently denies chest pain and shortness of breath, but does have mild abdominal discomfort after having had some clear liquids for dinner. For the time being, I recommend optimal BP control (with titration of hydralazine and/or labetalol) as needed. I do not recommend any non-invasive testing for the time being. I would proceed with the EGD to locate the source of bleeding.

## 2013-03-21 NOTE — Progress Notes (Signed)
Finished up second unit of blood, pt request to eat dinner before start of 2nd unit. Will continue to monitor.

## 2013-03-21 NOTE — ED Notes (Signed)
The pt woke up at 0300am with abd pain and bright red blood in her stool.  The just pta she had another bloody stool.  Bright red also.  She has nausea. No previous history.  She was dialyzed earlier today nv in dialysis.  Lt arm dialysis fistula.

## 2013-03-21 NOTE — Consult Note (Addendum)
HPI :  Patient is a 72 year old female with multiple medical problems including, but not limited to PVD, coronary artery disease / DES of the CFX and diagonal artery June 2013, ovarian cancer, and ESRD on hemodialysis. She has a history of hypertension requiring multiple antihypertensives . Patient is on multiple medications including Brillinta. She has multiple medication allergies. Patient is known to Dr. Fuller Plan for history of upper GI bleeding secondary to an intestinal AVM. She underwent EGD July 2013 with APC of a bleeding AVM in the descending duodenum.   Patient was seen in the office on the second of this month for a two month history of nausea and vomiting, bloating, lower abdominal pain, constipation, and malodorous vaginal discharge. Her constipation resolved and PCP treated her for vaginal yeast infection. Nausea and vomiting persisted. Patient presented to ED early this am after passing black stool with some red blood. Hgb 8.2, baseline usually mid 9-mid 10. Denies dizziness, SOB. She continues to have nausea and vomiting.   Past Medical History  Diagnosis Date  . Essential hypertension, benign   . Chronic bronchitis   . GERD (gastroesophageal reflux disease)   . PUD (peptic ulcer disease)   . History of lower GI bleeding   . Arthritis   . History of gout   . CAD (coronary artery disease)     a. 12/2011 NSTEMI/Cath/PCI LCX (2.25x14 Resolute DES) & D1 (2.25x22 Resolute DES);  b. 01/2012 Cath/PCI: LM 30, LAD 30p, 40-63m, D1 stent ok, 99 in sm branch of diag, LCX patent stent, OM1 20, RCA 95 ost (4.0x12 Promus DES), EF 55%;  c. 04/2012 Lexi Cardiolite  EF 48%, small area of scar @ base/mid inflat wall with mild peri-infarct ischemia.; CABG 12/4  . High cholesterol 12/2011  . Pneumonia ~ 2009  . Iron deficiency anemia   . TIA (transient ischemic attack)   . Anxiety   . History of blood transfusion 07/2011; 12/2011; 01/2012 X 2; 04/2012  . Carotid artery disease     a. [id number] LICA, [id number]    . Mitral regurgitation     a. Moderate by echo, 02/2012  . Myocardial infarction   . ESRD (end stage renal disease) on dialysis 12/15/11    a. HD @ [facility]; Mon, Wed, Fri  . Ovarian cancer 1992  . Colon cancer 1992  . Chronic diastolic CHF (congestive heart failure)     a. 02/2012 Echo EF 60-65%, nl wall motion, Gr 1 DD, mod MR    Family History  Problem Relation Age of Onset  . Other      noncontributory for early CAD  . Heart disease Mother     Heart Disease before age 91  . Hyperlipidemia Mother   . Hypertension Mother   . Heart disease Father   . Hyperlipidemia Father   . Hypertension Father    History  Substance Use Topics  . Smoking status: Never Smoker   . Smokeless tobacco: Never Used  . Alcohol Use: No   Current Facility-Administered Medications  Medication Dose Route Frequency Provider Last Rate Last Dose  . famotidine (PEPCID) 40 mg in dextrose 5 % 150 mL infusion  1 mg/hr Intravenous Continuous Kelvin Cellar, MD 3.8 mL/hr at 03/21/13 0916 1 mg/hr at 03/21/13 [id number]   Current Outpatient Prescriptions  Medication Sig Dispense Refill  . ALPRAZolam (XANAX) 0.5 MG tablet Take 0.5 mg by mouth 3 (three) times daily.      Marland Kitchen amLODipine (NORVASC) 10 MG tablet Take 10 mg by mouth  daily.      . atorvastatin (LIPITOR) 20 MG tablet Take 20 mg by mouth daily.      . cloNIDine (CATAPRES) 0.3 MG tablet Take 0.3 mg by mouth daily.      Marland Kitchen dexlansoprazole (DEXILANT) 60 MG capsule Take one po 30 minutes prior to breakfast and dinner. Lot [id number]  20 capsule  0  . fluticasone (FLONASE) 50 MCG/ACT nasal spray Place 2 sprays into the nose daily.      . folic acid (FOLVITE) 1 MG tablet Take 1 tablet (1 mg total) by mouth daily.  30 tablet  6  . folic acid-vitamin b complex-vitamin c-selenium-zinc (DIALYVITE) 3 MG TABS Take 1 tablet by mouth daily.      . hydrALAZINE (APRESOLINE) 25 MG tablet Take 25 mg by mouth 2 (two) times daily.      . hydrOXYzine (ATARAX/VISTARIL) 25 MG tablet  Take 25 mg by mouth daily as needed for itching.       . iron polysaccharides (NIFEREX) 150 MG capsule Take 150 mg by mouth daily.      Marland Kitchen labetalol (NORMODYNE) 200 MG tablet Take 200 mg by mouth daily.      Marland Kitchen lanthanum (FOSRENOL) 1000 MG chewable tablet Chew 1,000 mg by mouth 3 (three) times daily after meals.      . lidocaine-prilocaine (EMLA) cream Apply 1 application topically every Monday, Wednesday, and Friday.       . loratadine (CLARITIN) 10 MG tablet Take 10 mg by mouth daily.       . SENSIPAR 30 MG tablet Take 30 mg by mouth daily.       Marland Kitchen tobramycin-dexamethasone (TOBRADEX) ophthalmic solution Place 1 drop into the left eye 4 (four) times daily.       . traMADol (ULTRAM) 50 MG tablet Take 50 mg by mouth every 6 (six) hours as needed for pain.       Marland Kitchen albuterol (PROVENTIL HFA;VENTOLIN HFA) 108 (90 BASE) MCG/ACT inhaler Inhale 2 puffs into the lungs every 6 (six) hours as needed for wheezing or shortness of breath.       . nitroGLYCERIN (NITROSTAT) 0.4 MG SL tablet Place 0.4 mg under the tongue every 5 (five) minutes as needed for chest pain.      . polyethylene glycol powder (GLYCOLAX/MIRALAX) powder Take 17 g by mouth daily.  255 g  1   Allergies  Allergen Reactions  . Aspirin Other (See Comments)    Mess up her stomach; "makes my bowels have blood in them". Takes 81 mg EC Aspirin   . Contrast Media [Iodinated Diagnostic Agents] Itching  . Iron Itching and Other (See Comments)    "they gave me iron in dialysis; had to give me Benadryl cause I had to have the iron" (05/02/2012)  . Macrodantin [Nitrofurantoin Macrocrystal] Other (See Comments)    "broke me out in big old knots all over my body; had to go to ER"  . Penicillins Other (See Comments)    "makes me real weak when I take it; like I'll pass out"  . Plavix [Clopidogrel Bisulfate] Rash  . Bactrim [Sulfamethoxazole W-Trimethoprim] Rash  . Sulfa Antibiotics Rash  . Venofer [Ferric Oxide] Itching    Patient reports using  Benadryl prior to doses as [place]: All systems reviewed and negative except where noted in HPI.   Dg Abd 2 Views  03/21/2013   *RADIOLOGY REPORT*  Clinical Data: Abdominal pain.  Diarrhea.  ABDOMEN - 2  VIEW  Comparison: 12/24/2012.  Findings: Radiopaque material throughout the colon most likely related to ingest material with partial filling of the diverticula. No plain film evidence of bowel obstruction or free intraperitoneal air.  Multiple surgical clips within the pelvis greater to the right.  Cardiomegaly post CABG with coronary artery calcification.  Radiopaque structure projects over the upper liver which may be related to ribs although parenchymal calcification not excluded.  IMPRESSION: Colonic diverticula.  No plain film evidence of bowel obstruction or free intraperitoneal air.  Please see above.   Original Report Authenticated By: Genia Del, M.D.    Physical Exam: BP 167/66  Pulse 86  Temp(Src) 98.8 F (37.1 C) (Oral)  Resp 18  Ht 5\' 1"  (1.549 m)  Wt 140 lb (63.504 kg)  BMI 26.47 kg/m2  SpO2 98% Constitutional: Pleasant,well-developed, black female in no acute distress. HEENT: Normocephalic and atraumatic. Conjunctivae pale. No scleral icterus. Neck supple.  Cardiovascular: Normal rate, regular rhythm.  Pulmonary/chest: Effort normal and breath sounds normal. No wheezing, rales or rhonchi. Abdominal: Soft, nondistended, nontender. Bowel sounds active throughout. There are no masses palpable. No hepatomegaly. Rectal exam: Done by ED physician and +melena. Not repeated. Melenic stool in container at bedside.  Extremities: no edema. LUE dialysis access Lymphadenopathy: No cervical adenopathy noted. Neurological: Alert and oriented to person place and time. Skin: Skin is warm and dry. No rashes noted. Psychiatric: Normal mood and affect. Behavior is normal.   ASSESSMENT AND PLAN:  26. 72 year old female with upper GI bleed manifested by melena  with some brighter colored blood.  Bleeding in setting of Brillinta. Hemodynamically stable. Patient has recieved a unit of FFP. Rule out PUD, recurrent upper gastrointestinal AVM. For further evaluation patient will be scheduled for EGD this am.  2. Multiple medical problems as listed in PMH.   3. Chronic anticoagulant therapy, on Brillinta.   4. ESRD, on HD. Dialyzed yesterday.   5. Acute on chronic anemia. Hgb down about a gram from baseline. She will be getting a unit of blood soon.      ________________________________________________________________________  Velora Heckler GI MD note:  I personally examined the patient, reviewed the data and agree with the assessment and plan described above.  She was given FFP in ER and had welts, rash, hypotension, breathing difficulty.  That has all resolved with benedryl and IV steroids however troponin level is up (0.34) and I suspect at least mild cardial damage.  Currently she is resting comfortably in ICU.  BP 150s/80s, HR 70s.  Her last BM was this morning.  Last night, the overt bleeding was mainly melenic, this AM she saw red blood per rectum.  She has been on brilinta for 10 months and has not had bleeding until now.  Last July she had Gi bleeding, EGD by Dr. Fuller Plan found and treated an actively bleeding duodenal AVM.  She is possibly bleeding from intestinal AVM again.  Given the pretty extreme reaction to FFP today in ER, including at least minor cardiac damage, and she does not appear to be actively bleeding now, the typical 'washout' for brilinta is 5 days (FFP does not reverse it), I favor just supporting her for now with blood products, IV PPI twice daily.  She can have clear liquids today, will make her NPO after MN for potential EGD tomorrow but that will be determined tomorrow.   Owens Loffler, MD [facility] Pager [phone]   After ordering protonix IV, RN told me she was allergic to protonix.  I confirmed this with the  patient (rash), added protonix to her allergy list and then ordered pepcid iv bid instead.

## 2013-03-21 NOTE — Progress Notes (Signed)
I was called by emergency department RN, reported to me that patient broke our into hives and pruritis after infusing fresh frozen plasma. I instructed RN to administer Benadryl as well as Solu-Medrol. Despite this intervention, appeared patient continued to worsen, developing wheezing and shortness of breath. I asked her to have epinephrine ready. I immediately evaluated patient, presently her systolic blood pressures are in the 160s, and she reports feeling a little better. I discussed case with pulmonary critical care requesting consultation in emergent apartment as she could potentially need closer monitoring. Have also requested central line placement. Will await further recommendations from pulmonary critical care.

## 2013-03-21 NOTE — Progress Notes (Signed)
Physician notified: Rande Brunt At: 1345  Regarding: critical troponin at 0.34  Returned Response at: in person.  Order(s): no orders at this time, MD to place in CPOE.

## 2013-03-21 NOTE — ED Notes (Signed)
Pt with local reaction to zofran - hives already fading.  No s/s airway involvement.  Admitting MD to be notified.  IV team paged and responded to place 2nd IV - Attempted unsuccessfully to find 2nd IV.

## 2013-03-21 NOTE — ED Notes (Signed)
Attempt by IV team unsuccessful.  Sh[name]  [phone] stated to call when 2nd line in.  Internal medicine paged.

## 2013-03-21 NOTE — ED Notes (Signed)
IV team paged.  

## 2013-03-21 NOTE — Progress Notes (Signed)
Follow up note  Patient with history of CAD, had reported chest pain in the ER. Troponin came back at 0.34. Instructed RN to transfuse 2 units of PRBC"s now. I have consulted cardiology.

## 2013-03-21 NOTE — H&P (Signed)
[facility] History and Physical  RAJA VERHOEVEN [id number] DOB: 03/01/1941 DOA: 03/21/2013  Referring physician: Jola Schmidt MD PCP: [name], MD    Chief Complaint: Bloody stools  HPI: Shawna [name] is a 72 y.o. female  with a past medical history of coronary artery disease, status post percutaneous intervention in June and July of 2013, end-stage renal disease on hemodialysis Mondays Wednesdays and Fridays, currently taking Brilinta, presenting to the emergency department overnight with complaints of bloody stools and abdominal pain. She reports developing bloody stools yesterday afternoon and early evening having a total of 4 bloody bowel movements. She is also noticed black tarry stools possibly over the last several days. Patient complains of associated epigastric pain, characterized as sharp, stabbing, nonradiating, intermittent. She also complains of nausea and vomiting, reporting gastric contents and nonbloody. Patient reports associated generalized weakness, dizzy, lightheadedness as well as a subjective fevers and chills. She reports having an episode of hematemesis a while back which she attributes to Celebrex therapy. She has not had syncope or falls at home. One unit of packed red blood cells have been ordered for transfusion in the emergency department. IV Protonix had been ordered however patient reported a history of Protonix allergy. Initial lab work showing a hemoglobin of 8.2 with a hematocrit of 24.6. She has not had further episodes of bloody stools while in the ED. Presently she is hemodynamically stable.                                   Review of Systems: The patient denies anorexia, weight loss,, vision loss, decreased hearing, hoarseness, chest pain, syncope, dyspnea on exertion, peripheral edema, balance deficits, hemoptysis, hematuria, incontinence, genital sores, suspicious skin lesions, transient blindness, difficulty walking, depression, unusual weight change,   enlarged lymph nodes, angioedema, and breast masses.    Past Medical History  Diagnosis Date  . Essential hypertension, benign   . Chronic bronchitis   . GERD (gastroesophageal reflux disease)   . PUD (peptic ulcer disease)   . History of lower GI bleeding   . Arthritis   . History of gout   . CAD (coronary artery disease)     a. 12/2011 NSTEMI/Cath/PCI LCX (2.25x14 Resolute DES) & D1 (2.25x22 Resolute DES);  b. 01/2012 Cath/PCI: LM 30, LAD 30p, 40-21m, D1 stent ok, 99 in sm branch of diag, LCX patent stent, OM1 20, RCA 95 ost (4.0x12 Promus DES), EF 55%;  c. 04/2012 Lexi Cardiolite  EF 48%, small area of scar @ base/mid inflat wall with mild peri-infarct ischemia.; CABG 12/4  . High cholesterol 12/2011  . Pneumonia ~ 2009  . Iron deficiency anemia   . TIA (transient ischemic attack)   . Anxiety   . History of blood transfusion 07/2011; 12/2011; 01/2012 X 2; 04/2012  . Carotid artery disease     a. [id number] LICA, [id number]   . Mitral regurgitation     a. Moderate by echo, 02/2012  . Myocardial infarction   . ESRD (end stage renal disease) on dialysis 12/15/11    a. HD @ [facility]; Mon, Wed, Fri  . Ovarian cancer 1992  . Colon cancer 1992  . Chronic diastolic CHF (congestive heart failure)     a. 02/2012 Echo EF 60-65%, nl wall motion, Gr 1 DD, mod MR   Past Surgical History  Procedure Laterality Date  . Abdominal hysterectomy  1992  . Appendectomy  06/1990  .  Tubal ligation  1980's  . Av fistula placement  07/2009    left upper arm  . Thrombectomy / arteriovenous graft revision  2011    left upper arm  . Colon resection  1992  . Esophagogastroduodenoscopy  01/20/2012    Procedure: ESOPHAGOGASTRODUODENOSCOPY (EGD);  Surgeon: Ladene Artist, MD,FACG;  Location: [facility] ENDOSCOPY;  Service: Endoscopy;  Laterality: N/A;  . Dilation and curettage of uterus    . Coronary angioplasty with stent placement  12/15/11    "2"  . Coronary angioplasty with stent placement  y/2013    "1; makes total of  3" (05/02/2012)  . Coronary artery bypass graft  06/13/2012    Procedure: CORONARY ARTERY BYPASS GRAFTING (CABG);  Surgeon: Grace Isaac, MD;  Location: [place];  Service: Open Heart Surgery;  Laterality: N/A;  cabg x four;  using left internal mammary artery, and left leg greater saphenous vein harvested endoscopically  . Intraoperative transesophageal echocardiogram  06/13/2012    Procedure: INTRAOPERATIVE TRANSESOPHAGEAL ECHOCARDIOGRAM;  Surgeon: Grace Isaac, MD;  Location: [place];  Service: Open Heart Surgery;  Laterality: N/A;  . Colectomy  1992   Social History:  reports that she has never smoked. She has never used smokeless tobacco. She reports that she does not drink alcohol or use illicit drugs. Patient is currently married, resides at home. She is independent on all activities of daily living.  Allergies  Allergen Reactions  . Aspirin Other (See Comments)    Mess up her stomach; "makes my bowels have blood in them". Takes 81 mg EC Aspirin   . Contrast Media [Iodinated Diagnostic Agents] Itching  . Iron Itching and Other (See Comments)    "they gave me iron in dialysis; had to give me Benadryl cause I had to have the iron" (05/02/2012)  . Macrodantin [Nitrofurantoin Macrocrystal] Other (See Comments)    "broke me out in big old knots all over my body; had to go to ER"  . Penicillins Other (See Comments)    "makes me real weak when I take it; like I'll pass out"  . Plavix [Clopidogrel Bisulfate] Rash  . Bactrim [Sulfamethoxazole W-Trimethoprim] Rash  . Sulfa Antibiotics Rash  . Venofer [Ferric Oxide] Itching    Patient reports using Benadryl prior to doses as [place]    Family History  Problem Relation Age of Onset  . Other      noncontributory for early CAD  . Heart disease Mother     Heart Disease before age 28  . Hyperlipidemia Mother   . Hypertension Mother   . Heart disease Father   . Hyperlipidemia Father   . Hypertension Father     Prior to  Admission medications   Medication Sig Start Date End Date Taking? Authorizing Provider  ALPRAZolam Duanne Moron) 0.5 MG tablet Take 0.5 mg by mouth 3 (three) times daily.   Yes Historical Provider, MD  amLODipine (NORVASC) 10 MG tablet Take 10 mg by mouth daily.   Yes Historical Provider, MD  atorvastatin (LIPITOR) 20 MG tablet Take 20 mg by mouth daily.   Yes Historical Provider, MD  cloNIDine (CATAPRES) 0.3 MG tablet Take 0.3 mg by mouth daily.   Yes Historical Provider, MD  dexlansoprazole (DEXILANT) 60 MG capsule Take one po 30 minutes prior to breakfast and dinner. Lot [id number] 03/12/13  Yes Willia Craze, NP  fluticasone (FLONASE) 50 MCG/ACT nasal spray Place 2 sprays into the nose daily.   Yes Historical Provider, MD  folic acid (  FOLVITE) 1 MG tablet Take 1 tablet (1 mg total) by mouth daily. 07/26/12  Yes Aurora Mask, PA-C  folic acid-vitamin b complex-vitamin c-selenium-zinc (DIALYVITE) 3 MG TABS Take 1 tablet by mouth daily.   Yes Historical Provider, MD  hydrALAZINE (APRESOLINE) 25 MG tablet Take 25 mg by mouth 2 (two) times daily.   Yes Historical Provider, MD  hydrOXYzine (ATARAX/VISTARIL) 25 MG tablet Take 25 mg by mouth daily as needed for itching.    Yes Historical Provider, MD  iron polysaccharides (NIFEREX) 150 MG capsule Take 150 mg by mouth daily.   Yes Historical Provider, MD  labetalol (NORMODYNE) 200 MG tablet Take 200 mg by mouth daily.   Yes Historical Provider, MD  lanthanum (FOSRENOL) 1000 MG chewable tablet Chew 1,000 mg by mouth 3 (three) times daily after meals.   Yes Historical Provider, MD  lidocaine-prilocaine (EMLA) cream Apply 1 application topically every Monday, Wednesday, and Friday.    Yes Historical Provider, MD  loratadine (CLARITIN) 10 MG tablet Take 10 mg by mouth daily.    Yes Historical Provider, MD  SENSIPAR 30 MG tablet Take 30 mg by mouth daily.  12/21/12  Yes Historical Provider, MD  tobramycin-dexamethasone [facility]) ophthalmic solution Place 1 drop into  the left eye 4 (four) times daily.  09/28/12  Yes Historical Provider, MD  traMADol (ULTRAM) 50 MG tablet Take 50 mg by mouth every 6 (six) hours as needed for pain.  07/12/12  Yes Jessica A Hope, PA-C  albuterol (PROVENTIL HFA;VENTOLIN HFA) 108 (90 BASE) MCG/ACT inhaler Inhale 2 puffs into the lungs every 6 (six) hours as needed for wheezing or shortness of breath.     Historical Provider, MD  nitroGLYCERIN (NITROSTAT) 0.4 MG SL tablet Place 0.4 mg under the tongue every 5 (five) minutes as needed for chest pain. 10/04/12   Aurora Mask, PA-C  polyethylene glycol powder (GLYCOLAX/MIRALAX) powder Take 17 g by mouth daily. 03/12/13   Willia Craze, NP   Physical Exam: Filed Vitals:   03/21/13 0645  BP: 181/62  Pulse: 76  Temp:   Resp: 18     General:  Patient is in no acute distress, awake alert pleasant  Eyes: Pupils are equal round reactive to light extraocular movement intact  Neck: Neck is supple symmetrical no jugular venous distention or carotid bruits  Cardiovascular: Regular rate and rhythm normal S1-S2 no murmurs rubs or gallops  Respiratory: Lungs are clear to auscultation bilaterally no wheezing rhonchi or rales  Abdomen: Abdomen is soft, she has mild tenderness to palpation over epigastric region  Skin: Intact, no rashes or lesions noted  Musculoskeletal: Preserved range of motion to all extremities  Psychiatric: Patient is awake alert oriented x3 pleasant  Neurologic: Cranial nerves 2-12 grossly intact no alteration to sensation global 5 of 5 muscle strength  Labs on Admission:  Basic Metabolic Panel:  Recent Labs Lab 03/21/13 0455  NA 138  K 4.3  CL 96  CO2 25  GLUCOSE 96  BUN 68*  CREATININE 7.43*  CALCIUM 9.0   Liver Function Tests:  Recent Labs Lab 03/21/13 0455  AST 14  ALT 9  ALKPHOS 86  BILITOT 0.4  PROT 6.0  ALBUMIN 3.2*   No results found for this basename: LIPASE, AMYLASE,  in the last 168 hours No results found for this basename:  AMMONIA,  in the last 168 hours CBC:  Recent Labs Lab 03/21/13 0455  WBC 6.9  HGB 8.2*  HCT 24.6*  MCV 92.5  PLT 233  Cardiac Enzymes: No results found for this basename: CKTOTAL, CKMB, CKMBINDEX, TROPONINI,  in the last 168 hours  BNP (last 3 results)  Recent Labs  09/05/12 2334 12/16/12 1810  PROBNP 5229.0* 8774.0*   CBG: No results found for this basename: GLUCAP,  in the last 168 hours  Radiological Exams on Admission: Dg Abd 2 Views  03/21/2013   *RADIOLOGY REPORT*  Clinical Data: Abdominal pain.  Diarrhea.  ABDOMEN - 2 VIEW  Comparison: 12/24/2012.  Findings: Radiopaque material throughout the colon most likely related to ingest material with partial filling of the diverticula. No plain film evidence of bowel obstruction or free intraperitoneal air.  Multiple surgical clips within the pelvis greater to the right.  Cardiomegaly post CABG with coronary artery calcification.  Radiopaque structure projects over the upper liver which may be related to ribs although parenchymal calcification not excluded.  IMPRESSION: Colonic diverticula.  No plain film evidence of bowel obstruction or free intraperitoneal air.  Please see above.   Original Report Authenticated By: Genia Del, M.D.      Assessment/Plan Active Problems:   Coronary atherosclerosis of native coronary artery   Essential hypertension, benign   ESRD (end stage renal disease)   HLD (hyperlipidemia)   GERD (gastroesophageal reflux disease)   Abdominal  pain, other specified site   Nausea and vomiting   GI bleeding   Acute blood loss anemia   Suspected upper GI bleed Patient on brilinta therapy presented to the emergency department with complaints of black tarry stools as well as bright red blood rectum. She has a presenting hemoglobin of 8.2 with hematocrit of 24.6. Presently hemodynamically stable. Will consult GI for endoscopy. She reports having a history of hematemesis in setting of Celebrex therapy.  Given reported history of Protonix allergy, will place her on IV famotidine. Monitor H&H, I transfuse one unit of packed red blood cells given symptoms of anemia as well as fresh frozen plasma. Await further recommendations from gastroenterology. Brilinta has been discontinued, this will need to be further assessed as an outpatient by her cardiologist.  Acute blood loss anemia. Secondary to probable upper GI bleed . Patient's hemoglobin trended down to 8.2, having symptoms of dizziness lightheadedness as well as generalized weakness. She has been typed and crossed and will be transfused one unit of packed red blood cells. Plan to repeat an H&H at 1:00 this afternoon then at 6 PM. I will also order fresh frozen plasma. She'll be monitored closely in the step down unit.  Coronary artery disease Patients with history of percutaneous intervention in June and July of 2013. She had been on [place] therapy which has now been discontinued due to 2 gastrointestinal bleeding. Antiplatelet therapy will need to be held, and reassessed in the outpatient setting with her cardiologist. Will cycle cardiac enzymes as she did mention to me having a little chest discomfort overnight. She is presently chest pain-free. EKG did not reveal acute ischemic changes.  Hypertension Patient on multiple antihypertensive agents, will continue her home dose of clonidine, amlodipine, hydralazine, and labetalol. She was hypertensive this morning though I suspect secondary to not taking her medications yesterday evening and this morning.  End-stage renal disease Patient undergoing hemodialysis on Mondays Wednesdays and Fridays, having her last HD yesterday. Will notify nephrology.  Abdominal pain I suspect secondary to underlying peptic ulcer disease. She has a normal white count of 6900, with liver enzymes within normal limits. Will start famotidine IV as she reported Protonix allergy.  DVT prophylaxis Bilateral extremity  SCDs   Code Status: Full code Family Communication: Plan discussed with patient and husband present at bedside  Disposition Plan: I anticipate patient will require greater than 2 night hospitalization  Time spent: 70 minutes  Kelvin Cellar [facility] Pager [phone]  If 7PM-7AM, please contact night-coverage [email or web address] Password [facility] 03/21/2013, 7:54 AM

## 2013-03-21 NOTE — ED Notes (Signed)
Dr Coralyn Pear with internal medicine at bedside.  He is aware that pt states she is allergic to protonix - states it gives her a rash.

## 2013-03-21 NOTE — Progress Notes (Signed)
Pt refused to take BP meds due to SBP being in 150's will continue to monitor

## 2013-03-21 NOTE — Progress Notes (Addendum)
Pt arrived from ED, VSS, HGB 7.4, call bell in place, family at bedside, will continue to monitor. Notified ENDO of new room. Notified Dr. Coralyn Pear of arrival and hgb results.   Spoke with Dr. Bjorn Loser blood transfusion. Still waiting on GI to call about EGD

## 2013-03-21 NOTE — ED Notes (Signed)
Pt c/o increased epigastric pain.  Dr Hazle Coca aware.  2nd ekg reveals no ekg changes.

## 2013-03-21 NOTE — Progress Notes (Signed)
Dr. Ardis Hughs by to assess pt for EGD, decision to not perform today due to event with FFP earlier in ED. Ordered transfusion of pRBC's. Paged Attending to get pre-medication orders. Along with notification of troponin level. Awaiting call back.  Will continue to monitor.

## 2013-03-21 NOTE — ED Provider Notes (Signed)
CSN: [id number]     Arrival date & time 03/21/13  0419 History   First MD Initiated Contact with Patient 03/21/13 0434     Chief Complaint  Patient presents with  . bloody stools     HPI Patient reports developing nausea and vomiting over the past 24 hours and upper abdominal pain.  She also reports that she's having dark-colored stool and feels like that she may have had a couple episodes of bright red blood from her rectum today.  A rectal exam on my evaluation the patient has melena on exam.  She reports that when she vomited yesterday she was feeling so poorly she did not look at her vomit.  She has a history of upper GI bleed before in the past.  One year ago she underwent EGD that demonstrated bleeding AVMs of her stomach and her duodenum.  At this time that is required injection therapy by gastroenterology.  Patient has a history of cardiac disease and she is on Ticagrelor.  She also has a history of end-stage renal disease and dialyzes Monday Wednesday Friday.   Past Medical History  Diagnosis Date  . Essential hypertension, benign   . Chronic bronchitis   . GERD (gastroesophageal reflux disease)   . PUD (peptic ulcer disease)   . History of lower GI bleeding   . Arthritis   . History of gout   . CAD (coronary artery disease)     a. 12/2011 NSTEMI/Cath/PCI LCX (2.25x14 Resolute DES) & D1 (2.25x22 Resolute DES);  b. 01/2012 Cath/PCI: LM 30, LAD 30p, 40-43m, D1 stent ok, 99 in sm branch of diag, LCX patent stent, OM1 20, RCA 95 ost (4.0x12 Promus DES), EF 55%;  c. 04/2012 Lexi Cardiolite  EF 48%, small area of scar @ base/mid inflat wall with mild peri-infarct ischemia.; CABG 12/4  . High cholesterol 12/2011  . Chronic diastolic CHF (congestive heart failure)     a. 02/2012 Echo EF 60-65%, nl wall motion, Gr 1 DD, mod MR  . Pneumonia ~ 2009  . Iron deficiency anemia   . TIA (transient ischemic attack)   . Anxiety   . History of blood transfusion 07/2011; 12/2011; 01/2012 X 2; 04/2012  .  Carotid artery disease     a. A999333 LICA, [id number]   . Mitral regurgitation     a. Moderate by echo, 02/2012  . Myocardial infarction   . Ovarian cancer 1992  . Colon cancer 1992  . ESRD (end stage renal disease) on dialysis 12/15/11    a. HD @ [facility]; Mon, Wed, Fri   Past Surgical History  Procedure Laterality Date  . Abdominal hysterectomy  1992  . Appendectomy  06/1990  . Tubal ligation  1980's  . Av fistula placement  07/2009    left upper arm  . Thrombectomy / arteriovenous graft revision  2011    left upper arm  . Colon resection  1992  . Esophagogastroduodenoscopy  01/20/2012    Procedure: ESOPHAGOGASTRODUODENOSCOPY (EGD);  Surgeon: Ladene Artist, MD,FACG;  Location: [facility] ENDOSCOPY;  Service: Endoscopy;  Laterality: N/A;  . Dilation and curettage of uterus    . Coronary angioplasty with stent placement  12/15/11    "2"  . Coronary angioplasty with stent placement  y/2013    "1; makes total of 3" (05/02/2012)  . Coronary artery bypass graft  06/13/2012    Procedure: CORONARY ARTERY BYPASS GRAFTING (CABG);  Surgeon: Grace Isaac, MD;  Location: [place];  Service: Open Heart  Surgery;  Laterality: N/A;  cabg x four;  using left internal mammary artery, and left leg greater saphenous vein harvested endoscopically  . Intraoperative transesophageal echocardiogram  06/13/2012    Procedure: INTRAOPERATIVE TRANSESOPHAGEAL ECHOCARDIOGRAM;  Surgeon: Grace Isaac, MD;  Location: [place];  Service: Open Heart Surgery;  Laterality: N/A;  . Colectomy  1992   Family History  Problem Relation Age of Onset  . Other      noncontributory for early CAD  . Heart disease Mother     Heart Disease before age 39  . Hyperlipidemia Mother   . Hypertension Mother   . Heart disease Father   . Hyperlipidemia Father   . Hypertension Father    History  Substance Use Topics  . Smoking status: Never Smoker   . Smokeless tobacco: Never Used  . Alcohol Use: No   OB History   Grav Para Term  Preterm Abortions TAB SAB Ect Mult Living                 Review of Systems  All other systems reviewed and are negative.    Allergies  Aspirin; Contrast media; Iron; Macrodantin; Penicillins; Plavix; Bactrim; Sulfa antibiotics; and Venofer  Home Medications   Current Outpatient Rx  Name  Route  Sig  Dispense  Refill  . ALPRAZolam (XANAX) 0.5 MG tablet   Oral   Take 0.5 mg by mouth 3 (three) times daily.         Marland Kitchen amLODipine (NORVASC) 10 MG tablet   Oral   Take 10 mg by mouth daily.         Marland Kitchen aspirin 81 MG chewable tablet   Oral   Chew 81 mg by mouth daily.         Marland Kitchen atorvastatin (LIPITOR) 20 MG tablet   Oral   Take 20 mg by mouth daily.         . cloNIDine (CATAPRES) 0.3 MG tablet   Oral   Take 0.3 mg by mouth daily.         Marland Kitchen dexlansoprazole (DEXILANT) 60 MG capsule      Take one po 30 minutes prior to breakfast and dinner. Lot [id number]   20 capsule   0   . fluticasone (FLONASE) 50 MCG/ACT nasal spray   Nasal   Place 2 sprays into the nose daily.         . folic acid (FOLVITE) 1 MG tablet   Oral   Take 1 tablet (1 mg total) by mouth daily.   30 tablet   6   . folic acid-vitamin b complex-vitamin c-selenium-zinc (DIALYVITE) 3 MG TABS   Oral   Take 1 tablet by mouth daily.         . hydrALAZINE (APRESOLINE) 25 MG tablet   Oral   Take 25 mg by mouth 2 (two) times daily.         . hydrOXYzine (ATARAX/VISTARIL) 25 MG tablet   Oral   Take 25 mg by mouth daily as needed for itching.          . iron polysaccharides (NIFEREX) 150 MG capsule   Oral   Take 150 mg by mouth daily.         Marland Kitchen labetalol (NORMODYNE) 200 MG tablet   Oral   Take 200 mg by mouth daily.         Marland Kitchen lanthanum (FOSRENOL) 1000 MG chewable tablet   Oral   Chew 1,000 mg by mouth 3 (three)  times daily after meals.         . lidocaine-prilocaine (EMLA) cream   Topical   Apply 1 application topically every Monday, Wednesday, and Friday.          . loratadine  (CLARITIN) 10 MG tablet   Oral   Take 10 mg by mouth daily.          . SENSIPAR 30 MG tablet   Oral   Take 30 mg by mouth daily.          . Ticagrelor (BRILINTA) 90 MG TABS tablet   Oral   Take 90 mg by mouth 2 (two) times daily.         Marland Kitchen tobramycin-dexamethasone (TOBRADEX) ophthalmic solution   Left Eye   Place 1 drop into the left eye 4 (four) times daily.          . traMADol (ULTRAM) 50 MG tablet   Oral   Take 50 mg by mouth every 6 (six) hours as needed for pain.          Marland Kitchen albuterol (PROVENTIL HFA;VENTOLIN HFA) 108 (90 BASE) MCG/ACT inhaler   Inhalation   Inhale 2 puffs into the lungs every 6 (six) hours as needed for wheezing or shortness of breath.          . nitroGLYCERIN (NITROSTAT) 0.4 MG SL tablet   Sublingual   Place 0.4 mg under the tongue every 5 (five) minutes as needed for chest pain.         . polyethylene glycol powder (GLYCOLAX/MIRALAX) powder   Oral   Take 17 g by mouth daily.   255 g   1    BP 200/80  Pulse 88  Temp(Src) 98.8 F (37.1 C) (Oral)  Resp 16  Ht 5\' 1"  (1.549 m)  Wt 140 lb (63.504 kg)  BMI 26.47 kg/m2  SpO2 98% Physical Exam  Nursing note and vitals reviewed. Constitutional: She is oriented to person, place, and time. She appears well-developed and well-nourished. No distress.  HENT:  Head: Normocephalic and atraumatic.  Eyes: EOM are normal.  Neck: Normal range of motion.  Cardiovascular: Normal rate, regular rhythm and normal heart sounds.   Pulmonary/Chest: Effort normal and breath sounds normal.  Abdominal: Soft. She exhibits no distension. There is no tenderness.  Genitourinary:  Melena on exam  Musculoskeletal: Normal range of motion.  Neurological: She is alert and oriented to person, place, and time.  Skin: Skin is warm and dry.  Psychiatric: She has a normal mood and affect. Judgment normal.    ED Course  Procedures (including critical care time) Labs Review Labs Reviewed  CBC - Abnormal; Notable  for the following:    RBC 2.66 (*)    Hemoglobin 8.2 (*)    HCT 24.6 (*)    RDW 17.2 (*)    All other components within normal limits  COMPREHENSIVE METABOLIC PANEL - Abnormal; Notable for the following:    BUN 68 (*)    Creatinine, Ser 7.43 (*)    Albumin 3.2 (*)    GFR calc non Af Amer 5 (*)    GFR calc Af Amer 6 (*)    All other components within normal limits  TYPE AND SCREEN  PREPARE RBC (CROSSMATCH)   Imaging Review No results found.  MDM   1. Upper GI bleed   2. Anemia   3. Nausea    Likely upper GI bleed with upper abdominal discomfort nausea and some vomiting.  Patient be started  on a Protonix drip.  She will likely benefit from early upper endoscopy.  Her hemoglobin of 8.2 but ongoing bleeding at this time as evidenced by ongoing discomfort and pain I think the patient would benefit from early blood transfusion given her significant cardiac history.    Hoy Morn, MD 03/21/13 [phone]

## 2013-03-21 NOTE — Consult Note (Signed)
CARDIOLOGY CONSULT NOTE   Patient ID: Shawna [name] MRN: [id number] DOB/AGE: 08-28-1940 72 y.o.  Admit date: 03/21/2013  Primary Physician   Shawna Percy, MD Primary Cardiologist   Shawna [name] Reason for Consultation   Bleeding while taking Brillinta  HPI: Shawna [name] is a 72 y/o female with h/o HTN, hyperlipidemia, CHF, PUD, ESRD, MR, and known CAD s/p CABG 2013: 12/2011 NSTEMI/Cath/PCI LCX (2.25x14 Resolute DES) & D1 (2.25x22 Resolute DES);  b. 01/2012 Cath/PCI: LM 30, LAD 30p, 40-75m, D1 stent ok, 99 in sm branch of diag, LCX patent stent, OM1 20, RCA 95 ost (4.0x12 Promus DES), EF 55%;  c. 04/2012 Lexi Cardiolite  EF 48%, small area of scar @ base/mid inflat wall with mild peri-infarct ischemia.; CABG 06/13/2012. Echo 06/1012 EF 40-45%.  Shawna [name] presented to the ED this morning with N/V, abdominal pain, and black tarry stools.  Shawna [name] reports that Shawna [name] has noticed blood in her stools and melena over the past few days, but her epigastric pain started last night and continued into the morning. Shawna [name] states the pain is right below her chest where Shawna [name] usually feels her reflux and that it radiates to her back.  Shawna [name] also states that it feels like "something is stuck" in her epigastrium.  Shawna [name] denies any CP, SOB, tachycardia, cough, fever or chills. Shawna [name] also denies any hematochezia, but reports an episode of that 20 years ago secondary to a peptic ulcer. Shawna [name] has noticed worsening reflux and epigastrium discomfort over the past few months, which Shawna [name] says is worst when Shawna [name] lays down. Shawna [name] denies any alcohol or tobacco use, and Shawna [name] also denies using any NSAIDs. Shawna [name] does take ASA 81 mg QD and Brillinta.  Shawna [name] also reports that Shawna [name] stopped taking her protonix a few months ago because Shawna [name] claims it was upsetting her stomach, but her GI doctor started her on Dexilant on 03/12/2013.  Otherwise, no recent changes to her medications has occurred.  Shawna [name] denies any recent changes to her appetite or eating habits. Shawna [name] denies any  recent trauma.   Cardiology is consulted due to her Brillinta use, with the concern of continuing it with active bleeding.  Her initial troponin was elevated as well to 0.34.  Shawna [name] has no ischemic changes on EKG.  He Hb dropped to 7.4 from 8.2 today, Shawna [name] was given 1 unit of blood and a second is planned for this evening. EGD is scheduled for the morning.     Past Medical History  Diagnosis Date  . Essential hypertension, benign   . Chronic bronchitis   . GERD (gastroesophageal reflux disease)   . PUD (peptic ulcer disease)   . History of lower GI bleeding   . Arthritis   . History of gout   . CAD (coronary artery disease)     a. 12/2011 NSTEMI/Cath/PCI LCX (2.25x14 Resolute DES) & D1 (2.25x22 Resolute DES);  b. 01/2012 Cath/PCI: LM 30, LAD 30p, 40-13m, D1 stent ok, 99 in sm branch of diag, LCX patent stent, OM1 20, RCA 95 ost (4.0x12 Promus DES), EF 55%;  c. 04/2012 Lexi Cardiolite  EF 48%, small area of scar @ base/mid inflat wall with mild peri-infarct ischemia.; CABG 12/4  . High cholesterol 12/2011  . Pneumonia ~ 2009  . Iron deficiency anemia   . TIA (transient ischemic attack)   . Anxiety   . History of blood transfusion 07/2011; 12/2011; 01/2012 X 2; 04/2012  . Carotid artery disease     a. [id number] LICA, [id number]   .  Mitral regurgitation     a. Moderate by echo, 02/2012  . Myocardial infarction   . ESRD (end stage renal disease) on dialysis 12/15/11    a. HD @ [facility]; Mon, Wed, Fri  . Ovarian cancer 1992  . Colon cancer 1992  . Chronic diastolic CHF (congestive heart failure)     a. 02/2012 Echo EF 60-65%, nl wall motion, Gr 1 DD, mod MR     Past Surgical History  Procedure Laterality Date  . Abdominal hysterectomy  1992  . Appendectomy  06/1990  . Tubal ligation  1980's  . Av fistula placement  07/2009    left upper arm  . Thrombectomy / arteriovenous graft revision  2011    left upper arm  . Colon resection  1992  . Esophagogastroduodenoscopy  01/20/2012    Procedure:  ESOPHAGOGASTRODUODENOSCOPY (EGD);  Surgeon: Shawna Artist, MD,FACG;  Location: [facility] ENDOSCOPY;  Service: Endoscopy;  Laterality: N/A;  . Dilation and curettage of uterus    . Coronary angioplasty with stent placement  12/15/11    "2"  . Coronary angioplasty with stent placement  y/2013    "1; makes total of 3" (05/02/2012)  . Coronary artery bypass graft  06/13/2012    Procedure: CORONARY ARTERY BYPASS GRAFTING (CABG);  Surgeon: Shawna Isaac, MD;  Location: [place];  Service: Open Heart Surgery;  Laterality: N/A;  cabg x four;  using left internal mammary artery, and left leg greater saphenous vein harvested endoscopically  . Intraoperative transesophageal echocardiogram  06/13/2012    Procedure: INTRAOPERATIVE TRANSESOPHAGEAL ECHOCARDIOGRAM;  Surgeon: Shawna Isaac, MD;  Location: [place];  Service: Open Heart Surgery;  Laterality: N/A;  . Colectomy  1992    Allergies  Allergen Reactions  . Aspirin Other (See Comments)    Mess up her stomach; "makes my bowels have blood in them". Takes 81 mg EC Aspirin   . Contrast Media [Iodinated Diagnostic Agents] Itching  . Iron Itching and Other (See Comments)    "they gave me iron in dialysis; had to give me Benadryl cause I had to have the iron" (05/02/2012)  . Macrodantin [Nitrofurantoin Macrocrystal] Other (See Comments)    "broke me out in big old knots all over my body; had to go to ER"  . Penicillins Other (See Comments)    "makes me real weak when I take it; like I'll pass out"  . Plavix [Clopidogrel Bisulfate] Rash  . Bactrim [Sulfamethoxazole W-Trimethoprim] Rash  . Sulfa Antibiotics Rash  . Venofer [Ferric Oxide] Itching    Patient reports using Benadryl prior to doses as [facility]  . Protonix [Pantoprazole Sodium] Rash    I have reviewed the patient's current medications . ALPRAZolam  0.5 mg Oral TID  . amLODipine  10 mg Oral Daily  . atorvastatin  20 mg Oral q1800  . [START ON 03/22/2013] cinacalcet  30 mg Oral Q breakfast   . cloNIDine  0.3 mg Oral Daily  . famotidine (PEPCID) IV  20 mg Intravenous Q24H  . hydrALAZINE  25 mg Oral BID  . labetalol  200 mg Oral Daily  . sodium chloride  3 mL Intravenous Q12H  . tobramycin-dexamethasone  1 drop Left Eye QID     acetaminophen, acetaminophen, albuterol, HYDROcodone-acetaminophen, morphine injection, nitroGLYCERIN  Prior to Admission medications   Medication Sig Start Date End Date Taking? Authorizing Provider  ALPRAZolam Duanne Moron) 0.5 MG tablet Take 0.5 mg by mouth 3 (three) times daily.   Yes Historical Provider, MD  amLODipine (NORVASC) 10 MG tablet Take 10 mg by mouth daily.   Yes Historical Provider, MD  atorvastatin (LIPITOR) 20 MG tablet Take 20 mg by mouth daily.   Yes Historical Provider, MD  cloNIDine (CATAPRES) 0.3 MG tablet Take 0.3 mg by mouth daily.   Yes Historical Provider, MD  dexlansoprazole (DEXILANT) 60 MG capsule Take one po 30 minutes prior to breakfast and dinner. Lot [id number] 03/12/13  Yes Willia Craze, NP  fluticasone (FLONASE) 50 MCG/ACT nasal spray Place 2 sprays into the nose daily.   Yes Historical Provider, MD  folic acid (FOLVITE) 1 MG tablet Take 1 tablet (1 mg total) by mouth daily. 07/26/12  Yes Aurora Mask, PA-C  folic acid-vitamin b complex-vitamin c-selenium-zinc (DIALYVITE) 3 MG TABS Take 1 tablet by mouth daily.   Yes Historical Provider, MD  hydrALAZINE (APRESOLINE) 25 MG tablet Take 25 mg by mouth 2 (two) times daily.   Yes Historical Provider, MD  hydrOXYzine (ATARAX/VISTARIL) 25 MG tablet Take 25 mg by mouth daily as needed for itching.    Yes Historical Provider, MD  iron polysaccharides (NIFEREX) 150 MG capsule Take 150 mg by mouth daily.   Yes Historical Provider, MD  labetalol (NORMODYNE) 200 MG tablet Take 200 mg by mouth daily.   Yes Historical Provider, MD  lanthanum (FOSRENOL) 1000 MG chewable tablet Chew 1,000 mg by mouth 3 (three) times daily after meals.   Yes Historical Provider, MD  lidocaine-prilocaine (EMLA)  cream Apply 1 application topically every Monday, Wednesday, and Friday.    Yes Historical Provider, MD  loratadine (CLARITIN) 10 MG tablet Take 10 mg by mouth daily.    Yes Historical Provider, MD  SENSIPAR 30 MG tablet Take 30 mg by mouth daily.  12/21/12  Yes Historical Provider, MD  tobramycin-dexamethasone [facility]) ophthalmic solution Place 1 drop into the left eye 4 (four) times daily.  09/28/12  Yes Historical Provider, MD  traMADol (ULTRAM) 50 MG tablet Take 50 mg by mouth every 6 (six) hours as needed for pain.  07/12/12  Yes Jessica A Hope, PA-C  albuterol (PROVENTIL HFA;VENTOLIN HFA) 108 (90 BASE) MCG/ACT inhaler Inhale 2 puffs into the lungs every 6 (six) hours as needed for wheezing or shortness of breath.     Historical Provider, MD  nitroGLYCERIN (NITROSTAT) 0.4 MG SL tablet Place 0.4 mg under the tongue every 5 (five) minutes as needed for chest pain. 10/04/12   Aurora Mask, PA-C  polyethylene glycol powder (GLYCOLAX/MIRALAX) powder Take 17 g by mouth daily. 03/12/13   Willia Craze, NP     History   Social History  . Marital Status: Married    Spouse Name: N/A    Number of Children: N/A  . Years of Education: N/A   Occupational History  . Not on file.   Social History Main Topics  . Smoking status: Never Smoker   . Smokeless tobacco: Never Used  . Alcohol Use: No  . Drug Use: No  . Sexual Activity: Yes   Other Topics Concern  . Not on file   Social History Narrative   Lives in [place], [place] with husband.  Dialysis pt - [facility].    Family Status  Relation Status Death Age  . Mother Deceased   . Father Deceased    Family History  Problem Relation Age of Onset  . Other      noncontributory for early CAD  . Heart disease Mother     Heart Disease before age 60  . Hyperlipidemia  Mother   . Hypertension Mother   . Heart disease Father   . Hyperlipidemia Father   . Hypertension Father      ROS:  Full 14 point review of systems complete and found to be negative  unless listed above.  Physical Exam: Blood pressure 180/72, pulse 84, temperature 98.7 F (37.1 C), temperature source Oral, resp. rate 18, height 5\' 1"  (1.549 m), weight 143 lb 4.8 oz (65 kg), SpO2 100.00%.  General: Well developed, well nourished, female in no acute distress Head: Normocephalic and atraumatic Lungs: CTA Heart: HRRR S1 S2, no rub/gallop, 2/6 systolic murmur. pulses are 2+ extrem.   Neck: No carotid bruits. No lymphadenopathy. mild JVD elevation. Abdomen: Bowel sounds present, abdomen soft and mildly tender in epigastrium without masses or hernias noted. Extremities: No clubbing or cyanosis. No edema.  Neuro: Alert and oriented X 3. No focal deficits noted. Psych:  Good affect, responds appropriately Skin: No rashes or lesions noted.  Labs:   Lab Results  Component Value Date   WBC 8.9 03/21/2013   HGB 8.1* 03/21/2013   HCT 24.7* 03/21/2013   MCV 92.5 03/21/2013   PLT 215 03/21/2013   No results found for this basename: INR,  in the last 72 hours   Recent Labs Lab 03/21/13 0455  NA 138  K 4.3  CL 96  CO2 25  BUN 68*  CREATININE 7.43*  CALCIUM 9.0  PROT 6.0  BILITOT 0.4  ALKPHOS 86  ALT 9  AST 14  GLUCOSE 96   Magnesium  Date Value Range Status  06/14/2012 1.3* 1.5 - 2.5 mg/dL Final    Recent Labs  03/21/13 1214 03/21/13 1444  TROPONINI 0.34* <0.30   No results found for this basename: TROPIPOC,  in the last 72 hours Pro B Natriuretic peptide (BNP)  Date/Time Value Range Status  12/16/2012  6:10 PM 8774.0* 0 - 125 pg/mL Final  09/05/2012 11:34 PM 5229.0* 0 - 125 pg/mL Final   Lab Results  Component Value Date   CHOL 129 07/07/2012   HDL 51 07/07/2012   LDLCALC 59 07/07/2012   TRIG 97 07/07/2012   Lab Results  Component Value Date   DDIMER 1.97* 07/03/2012   No results found for this basename: Lipase,  amylase   TSH  Date/Time Value Range Status  09/05/2012 11:34 PM 1.758  0.350 - 4.500 uIU/mL Final   Ferritin  Date/Time Value Range  Status  06/11/2012  7:28 AM 243  10 - 291 ng/mL Final     TIBC  Date/Time Value Range Status  07/03/2012  3:04 PM 181* 250 - 470 ug/dL Final     Iron  Date/Time Value Range Status  07/03/2012  3:04 PM 24* 42 - 135 ug/dL Final    Echo:  TEE, 06/13/2012: - Left ventricle: Systolic function was mildly to moderately reduced. The estimated ejection fraction was in the range of 40% to 45%. Severe hypokinesis of the inferior myocardium. - Aortic valve: Normal-sized, mildly to moderately calcified annulus. Trileaflet; mildly thickened, mildly calcified non-coronary leaflet. - Mitral valve: Mildly dilated annulus. Mobility of the posterior leaflet appeared mildly restricted. Severe 3-4+ regurgitation, with intermittent reversal of flow through pulmonary veins. - Staged echo: LVEF is essentially unchanged from the pre-op exam. EF approximately 40%. Post bypass the mitral regurgitation is severe, with consistent systolic flow reversal into pulmonary veins.  Impressions: - No change from pre-bypass images.   ECG:   03/21/2013: NSR, no ischemic changes; no change from previous in 12/2012  Vent. rate 79 BPM PR interval 159 ms QRS duration 91 ms QT/QTc 409/469 ms P-R-T axes 69 18 163  Cath: 06/08/2012: Left main: 40% distal stenosis.  Left Anterior Descending Artery: Large caliber vessel that courses to the apex. The proximal vessel has 30-40% stenosis. The mid vessel has a 40% stenosis. The mid to distal vessel has a hazy 60-70% stenosis. The diagonal is a moderate sized vessel  Intermediate artery: Moderate caliber vessel with ostial 30% stenosis.  Circumflex Artery: Moderate caliber vessel with proximal stent. There is 99% restenosis within the proximal edge of the stent. The first marginal branch is small to moderate sized with 99% ostial stenosis. This vessel arises from the distal portion of the stented segment.  Right Coronary Artery: Very large caliber, dominant vessel with  ostial stent with 99% restenosis in the stented segment beginning at the ostium. The mid and distal vessel has mild plaque disease.  Left Ventricular Angiogram: LVEF=40%, global hypokinesis.  Impression:  1. Triple vessel CAD with restenosis in the stented segments of the RCA and Circumflex with progression of the disease in the LAD  2. Moderate LV systolic dysfunction  3. Unstable angina   Radiology:  Dg Abd 2 Views  03/21/2013   *RADIOLOGY REPORT*  Clinical Data: Abdominal pain.  Diarrhea.  ABDOMEN - 2 VIEW  Comparison: 12/24/2012.  Findings: Radiopaque material throughout the colon most likely related to ingest material with partial filling of the diverticula. No plain film evidence of bowel obstruction or free intraperitoneal air.  Multiple surgical clips within the pelvis greater to the right.  Cardiomegaly post CABG with coronary artery calcification.  Radiopaque structure projects over the upper liver which may be related to ribs although parenchymal calcification not excluded.  IMPRESSION: Colonic diverticula.  No plain film evidence of bowel obstruction or free intraperitoneal air.  Please see above.   Original Report Authenticated By: Genia Del, M.D.    ASSESSMENT AND PLAN:     Active Problems:   Coronary atherosclerosis of native coronary artery--cycle enzymes and monitor for EKG changes. Pt. Is currently asymptomatic.     Essential hypertension, benign--continue monitoring vitals. BP elevated to 180/72 at last vital check.  Continue with current medications, but may need to add something to control BP.     HLD (hyperlipidemia)--continue statin, monitor diet.     GI bleeding--EGD planned for 9/12 to look for active bleeding.     Acute blood loss anemia--medicine team tx with transfusion. One unit given today already, continue with second unit as planned.  Monitor Hb and RBC levels.    SignedLanice Shirts, [place] 03/21/2013 6:43 PM Beeper [id number]  Co-Sign MD

## 2013-03-21 NOTE — ED Notes (Signed)
NP [place] with CC at bedside.

## 2013-03-21 NOTE — Progress Notes (Signed)
Started 1st unit of pRBC's   Pre-medicated with Benadryl 25 mg IV.  Please see doc flowsheets for info. VSS at start and currently at 63min after start.  Husband is at bedside. Will continue to montior.

## 2013-03-21 NOTE — ED Notes (Addendum)
Dr Coralyn Pear at bedside.  Pt no longer diaphoretic.  Pt states back pain decreasing.  Dr Coralyn Pear states to hold epi not that it appears pt is improving.  Pt continues to have no oral edema.

## 2013-03-21 NOTE — ED Notes (Addendum)
FFP scanned, but charted under Whole Blood.  Called blood bank and they stated this happens when blood is released before ffp.  She stated to free text that FFP was NOT packed RBC's.

## 2013-03-22 ENCOUNTER — Encounter (HOSPITAL_COMMUNITY): Payer: Self-pay | Admitting: Nephrology

## 2013-03-22 DIAGNOSIS — N186 End stage renal disease: Secondary | ICD-10-CM

## 2013-03-22 DIAGNOSIS — I214 Non-ST elevation (NSTEMI) myocardial infarction: Secondary | ICD-10-CM

## 2013-03-22 DIAGNOSIS — I509 Heart failure, unspecified: Secondary | ICD-10-CM

## 2013-03-22 DIAGNOSIS — I5022 Chronic systolic (congestive) heart failure: Secondary | ICD-10-CM | POA: Diagnosis present

## 2013-03-22 DIAGNOSIS — K5520 Angiodysplasia of colon without hemorrhage: Secondary | ICD-10-CM

## 2013-03-22 DIAGNOSIS — I5032 Chronic diastolic (congestive) heart failure: Secondary | ICD-10-CM | POA: Diagnosis present

## 2013-03-22 DIAGNOSIS — I5042 Chronic combined systolic (congestive) and diastolic (congestive) heart failure: Secondary | ICD-10-CM | POA: Diagnosis present

## 2013-03-22 LAB — BASIC METABOLIC PANEL
BUN: 95 mg/dL — ABNORMAL HIGH (ref 6–23)
Chloride: 93 mEq/L — ABNORMAL LOW (ref 96–112)
GFR calc Af Amer: 4 mL/min — ABNORMAL LOW (ref >90)
Potassium: 5.2 mEq/L — ABNORMAL HIGH (ref 3.5–5.1)

## 2013-03-22 LAB — PREPARE FRESH FROZEN PLASMA

## 2013-03-22 LAB — TYPE AND SCREEN
ABO/RH(D): O POS
Antibody Screen: NEGATIVE
Donor AG Type: NEGATIVE
Unit division: 0

## 2013-03-22 LAB — CBC
HCT: 30 % — ABNORMAL LOW (ref 36.0–46.0)
Hemoglobin: 10.5 g/dL — ABNORMAL LOW (ref 12.0–15.0)
MCHC: 35 g/dL (ref 30.0–36.0)

## 2013-03-22 MED ORDER — HYDROXYZINE HCL 25 MG PO TABS
25.0000 mg | ORAL_TABLET | Freq: Every day | ORAL | Status: DC | PRN
  Filled 2013-03-22: qty 1

## 2013-03-22 MED ORDER — POLYETHYLENE GLYCOL 3350 17 GM/SCOOP PO POWD
17.0000 g | Freq: Every day | ORAL | Status: DC
  Filled 2013-03-22: qty 255

## 2013-03-22 MED ORDER — NEPRO/CARBSTEADY PO LIQD: 237.0000 mL | ORAL | Status: DC | PRN

## 2013-03-22 MED ORDER — POLYETHYLENE GLYCOL 3350 17 G PO PACK
17.0000 g | PACK | Freq: Every day | ORAL | Status: DC
  Administered 2013-03-22 – 2013-03-24 (×3): 17 g via ORAL
  Filled 2013-03-22 (×6): qty 1

## 2013-03-22 MED ORDER — SODIUM CHLORIDE 0.9 % IV SOLN: 62.5000 mg | INTRAVENOUS | Status: DC

## 2013-03-22 MED ORDER — LORATADINE 10 MG PO TABS
10.0000 mg | ORAL_TABLET | Freq: Every day | ORAL | Status: DC
  Administered 2013-03-22 – 2013-03-27 (×5): 10 mg via ORAL
  Filled 2013-03-22 (×6): qty 1

## 2013-03-22 MED ORDER — FLUTICASONE PROPIONATE 50 MCG/ACT NA SUSP
2.0000 | Freq: Every day | NASAL | Status: DC
  Administered 2013-03-22 – 2013-03-27 (×5): 2 via NASAL
  Filled 2013-03-22: qty 16

## 2013-03-22 MED ORDER — DARBEPOETIN ALFA-POLYSORBATE 100 MCG/0.5ML IJ SOLN
100.0000 ug | INTRAMUSCULAR | Status: DC
  Administered 2013-03-22: 100 ug via INTRAVENOUS
  Filled 2013-03-22: qty 0.5

## 2013-03-22 MED ORDER — METOPROLOL TARTRATE 1 MG/ML IV SOLN
2.5000 mg | Freq: Once | INTRAVENOUS | Status: AC
  Administered 2013-03-22: 2.5 mg via INTRAVENOUS
  Filled 2013-03-22: qty 5

## 2013-03-22 MED ORDER — ALTEPLASE 2 MG IJ SOLR: 2.0000 mg | Freq: Once | INTRAMUSCULAR | Status: DC | PRN

## 2013-03-22 MED ORDER — HEPARIN SODIUM (PORCINE) 1000 UNIT/ML DIALYSIS: 1000.0000 [IU] | INTRAMUSCULAR | Status: DC | PRN

## 2013-03-22 MED ORDER — HYDRALAZINE HCL 20 MG/ML IJ SOLN
5.0000 mg | Freq: Once | INTRAMUSCULAR | Status: AC
  Administered 2013-03-22: 5 mg via INTRAVENOUS
  Filled 2013-03-22: qty 1
  Filled 2013-03-22: qty 0.25

## 2013-03-22 MED ORDER — LIDOCAINE-PRILOCAINE 2.5-2.5 % EX CREA: 1.0000 "application " | TOPICAL_CREAM | CUTANEOUS | Status: DC | PRN

## 2013-03-22 MED ORDER — LIDOCAINE HCL (PF) 1 % IJ SOLN: 5.0000 mL | INTRAMUSCULAR | Status: DC | PRN

## 2013-03-22 MED ORDER — DARBEPOETIN ALFA-POLYSORBATE 100 MCG/0.5ML IJ SOLN
INTRAMUSCULAR | Status: AC
  Filled 2013-03-22: qty 0.5

## 2013-03-22 MED ORDER — SODIUM CHLORIDE 0.9 % IV SOLN: 100.0000 mL | INTRAVENOUS | Status: DC | PRN

## 2013-03-22 MED ORDER — DEXLANSOPRAZOLE 60 MG PO CPDR
60.0000 mg | DELAYED_RELEASE_CAPSULE | Freq: Two times a day (BID) | ORAL | Status: DC
  Administered 2013-03-22 – 2013-03-27 (×9): 60 mg via ORAL
  Filled 2013-03-22 (×14): qty 1

## 2013-03-22 MED ORDER — HYDROXYZINE HCL 25 MG PO TABS
25.0000 mg | ORAL_TABLET | Freq: Three times a day (TID) | ORAL | Status: DC | PRN
  Administered 2013-03-22 – 2013-03-25 (×2): 25 mg via ORAL
  Filled 2013-03-22 (×2): qty 1

## 2013-03-22 MED ORDER — ONDANSETRON HCL 4 MG/2ML IJ SOLN
4.0000 mg | Freq: Four times a day (QID) | INTRAMUSCULAR | Status: DC | PRN
  Administered 2013-03-22 (×2): 4 mg via INTRAVENOUS
  Filled 2013-03-22 (×2): qty 2

## 2013-03-22 MED ORDER — SALINE SPRAY 0.65 % NA SOLN
1.0000 | NASAL | Status: DC | PRN
  Filled 2013-03-22 (×2): qty 44

## 2013-03-22 MED ORDER — PENTAFLUOROPROP-TETRAFLUOROETH EX AERO
1.0000 "application " | INHALATION_SPRAY | CUTANEOUS | Status: DC | PRN
  Administered 2013-03-22: 1 via TOPICAL

## 2013-03-22 MED ORDER — LORAZEPAM 2 MG/ML IJ SOLN
0.5000 mg | Freq: Once | INTRAMUSCULAR | Status: AC
  Administered 2013-03-22: 0.5 mg via INTRAVENOUS
  Filled 2013-03-22: qty 1

## 2013-03-22 MED ORDER — ACETAMINOPHEN 325 MG PO TABS
ORAL_TABLET | ORAL | Status: AC
  Filled 2013-03-22: qty 2

## 2013-03-22 NOTE — Progress Notes (Signed)
[facility] Progress Note   Subjective  No further bleeding. No BMs since yesterday   Objective   Vital signs in last 24 hours: Temp:  [98.4 F (36.9 C)-99.2 F (37.3 C)] 98.4 F (36.9 C) (09/12 0800) Pulse Rate:  [82-123] 110 (09/12 0800) Resp:  [15-23] 23 (09/12 0413) BP: (135-193)/(46-95) 190/86 mmHg (09/12 0800) SpO2:  [96 %-100 %] 98 % (09/12 0413) Weight:  [143 lb 4.8 oz (65 kg)] 143 lb 4.8 oz (65 kg) (09/11 1200) Last BM Date: 03/21/13 General:    Pleasant black female in NAD Heart:  Regular rate and rhythm Lungs: Respirations even and unlabored Abdomen:  Soft, nontender and nondistended. Normal bowel sounds. Extremities:  Without edema. Neurologic:  Alert and oriented,  grossly normal neurologically. Psych:  Cooperative. Normal mood and affect.  Lab Results:  Recent Labs  03/21/13 0455 03/21/13 1214 03/21/13 1444  WBC 6.9 8.9  --   HGB 8.2* 7.4* 8.1*  HCT 24.6* 22.2* 24.7*  PLT 233 215  --    BMET  Recent Labs  03/21/13 0455  NA 138  K 4.3  CL 96  CO2 25  GLUCOSE 96  BUN 68*  CREATININE 7.43*  CALCIUM 9.0   LFT  Recent Labs  03/21/13 0455  PROT 6.0  ALBUMIN 3.2*  AST 14  ALT 9  ALKPHOS 86  BILITOT 0.4      Assessment / Plan:   73. 72 year old female with upper GI bleed manifested by melena with some brighter colored blood. Bleeding in setting of Brillinta. Rule out PUD, recurrent upper gastrointestinal AVM. EGD wasn't done yesterday do to some IV access issues and then because of reaction to FFP. No further bleeding. Patient has dialysis today. Will keep NPO in case we do proceed with EGD today  2. Multiple medical problems as listed in PMH.   3. Chronic anticoagulant therapy, on Brillinta.   4. ESRD, on HD. Dialyzes today  5. Acute on chronic anemia, s/p 2 units of blood last night. Repeat hgb not available yet as phlebotomist couldn't get the specimen  6. Elevated troponin. Cardiology has evaluated, feels secondary to  demand ischemia.     LOS: 1 day   Tye Savoy  03/22/2013, 10:06 AM   GI ATTENDING  Interval history and laboratories reviewed. Case reviewed with Dr. Ardis Hughs this morning. No further GI bleeding. Now off Brilinta. Prior history of bleeding AVM. Suspect she may have had the same. Agree with plans for endoscopy with possible therapeutics AFTER Brilinta washout. Suspect proceeding early next week. We'll continue to follow.  Docia Chuck. Geri Seminole., M.D. [facility] of Gastroenterology

## 2013-03-22 NOTE — Consult Note (Signed)
PULMONARY  / CRITICAL CARE MEDICINE  Name: Shawna [name] MRN: [id number] DOB: 1940-10-16    ADMISSION DATE:  03/21/2013 CONSULTATION DATE:  03/21/13  REFERRING MD :  Coralyn Pear  PRIMARY SERVICE:  [facility]  CHIEF COMPLAINT:  GI bleed, blood reaction     BRIEF:  72 yo female with hx ESRD, CAD s/p CABG 2013 on Brilinta, HTN, CHF presented 9/11 with 2 day hx epigastric pain, bloody stools, weakness.  In ER was being transfused with FFP and became hypotensive, tachycardic, wheezing, diaphoretic when transfusion started.  She improved quickly with IV fluids and stopping transfusion but cont to have mild chest/back pain and epigastric pain.  PCCM consulted for ?ICU monitoring and IV access.     SIGNIFICANT EVENTS / STUDIES:  9/11 transfusion reaction   LINES / TUBES:   CULTURES: Results for orders placed during the hospital encounter of 03/21/13  MRSA PCR SCREENING     Status: None   Collection Time    03/21/13  1:30 PM      Result Value Range Status   MRSA by PCR NEGATIVE  NEGATIVE Final   Comment:            The GeneXpert MRSA Assay (FDA     approved for NASAL specimens     only), is one component of a     comprehensive MRSA colonization     surveillance program. It is not     intended to diagnose MRSA     infection nor to guide or     monitor treatment for     MRSA infections.     ANTIBIOTICS: Anti-infectives   None       SUBJECTIVE/OVERNIGHT/INTERVAL HX 03/22/13 -seen around 11am: At that time. No bleeding. Feeling ok except for some abd pain. VS stable. Was scheduled for EGD. Did not need CVL   VITAL SIGNS: Temp:  [98.4 F (36.9 C)-99.2 F (37.3 C)] 98.9 F (37.2 C) (09/12 1230) Pulse Rate:  [85-110] 88 (09/12 1600) Resp:  [15-23] 23 (09/12 0413) BP: (148-217)/(72-99) 179/72 mmHg (09/12 1600) SpO2:  [94 %-100 %] 95 % (09/12 1230) Weight:  [145 lb 8.1 oz (66 kg)] 145 lb 8.1 oz (66 kg) (09/12 1230)  PHYSICAL EXAMINATION: General:  Chronically ill appearing female,  mild discomfort  Neuro:  Awake, alert, appropriate, MAE HEENT:  Mm dry, no JVD Cardiovascular:  s1s2 rrr, no m/r/g Lungs:  resps even non labored on [place], no wheeze, cta  Abdomen:  Soft, mildly tender, +bs Musculoskeletal:  Warm and dry, no edema    Recent Labs Lab 03/21/13 0455 03/22/13 0500  NA 138 134*  K 4.3 5.2*  CL 96 93*  CO2 25 18*  BUN 68* 95*  CREATININE 7.43* 10.52*  GLUCOSE 96 109*    Recent Labs Lab 03/21/13 0455 03/21/13 1214 03/21/13 1444 03/22/13 0500  HGB 8.2* 7.4* 8.1* 10.5*  HCT 24.6* 22.2* 24.7* 30.0*  WBC 6.9 8.9  --  14.0*  PLT 233 215  --  249   Dg Abd 2 Views  03/21/2013   *RADIOLOGY REPORT*  Clinical Data: Abdominal pain.  Diarrhea.  ABDOMEN - 2 VIEW  Comparison: 12/24/2012.  Findings: Radiopaque material throughout the colon most likely related to ingest material with partial filling of the diverticula. No plain film evidence of bowel obstruction or free intraperitoneal air.  Multiple surgical clips within the pelvis greater to the right.  Cardiomegaly post CABG with coronary artery calcification.  Radiopaque structure projects over the upper liver  which may be related to ribs although parenchymal calcification not excluded.  IMPRESSION: Colonic diverticula.  No plain film evidence of bowel obstruction or free intraperitoneal air.  Please see above.   Original Report Authenticated By: Genia Del, M.D.    ASSESSMENT / PLAN:  GI bleed  Epigastric/chest pain  Blood transfusion reaction (FFP) Blood loss anemia - mild  CAD - s/p CABG  ESRD   Pt with GI bleed, on Brilinta post CABG.  Had reaction to transfusion FFP, much improved with fluids, solumedrol, pepcid.  Does have some epigastric/chest pain but this has been ongoing since the GI bleed began.  Does have limited IV access (ESRD) but is currently anticoagulated.    03/22/13: patient stable. No PCCM intervntion  REC -  OK for SDU  No PCCM interventin right now Goal Hgb > 7gm%  PCCMwill  sign off   Dr. Brand Males, M.D., [facility].C.P Pulmonary and Critical Care Medicine Staff Physician [facility] Pager: [phone], If no answer or between  15:00h - 7:00h: call [phone]  03/22/2013 4:53 PM

## 2013-03-22 NOTE — Progress Notes (Signed)
CRITICAL VALUE ALERT  Critical value received:  Troponin 0.33  Date of notification:  03/21/13  Time of notification:  2300  Critical value read back:yes  Nurse who received alert:  K. Royden Purl  MD notified (1st page):  Raliegh Ip Schorr  Time of first page:  2300  MD notified (2nd page):  Time of second page:  Responding MD:  Schorr  Time MD responded:  [phone]

## 2013-03-22 NOTE — Progress Notes (Signed)
Utilization review completed.  

## 2013-03-22 NOTE — Progress Notes (Signed)
Subjective:  No chest pain. Complains of headache and epigastric pain. Rhythm sinus tachycardia. Has received 2 units of packed cells. Repeat Hgb this am is pending. Reports melena for the past week as well as some hematochezia.  Objective:  Vital Signs in the last 24 hours: Temp:  [98.4 F (36.9 C)-99.2 F (37.3 C)] 98.4 F (36.9 C) (09/12 0757) Pulse Rate:  [80-123] 84 (09/11 1500) Resp:  [15-23] 23 (09/12 0413) BP: (135-194)/(46-95) 154/94 mmHg (09/12 0413) SpO2:  [96 %-100 %] 98 % (09/12 0413) Weight:  [143 lb 4.8 oz (65 kg)] 143 lb 4.8 oz (65 kg) (09/11 1200)  Intake/Output from previous day: 09/11 0701 - 09/12 0700 In: 997.5 [Blood:997.5] Out: -  Intake/Output from this shift:    . ALPRAZolam  0.5 mg Oral TID  . amLODipine  10 mg Oral Daily  . atorvastatin  20 mg Oral q1800  . cinacalcet  30 mg Oral Q breakfast  . cloNIDine  0.3 mg Oral Daily  . famotidine (PEPCID) IV  20 mg Intravenous Q24H  . hydrALAZINE  25 mg Oral BID  . labetalol  200 mg Oral Daily  . metoprolol  2.5 mg Intravenous Once  . sodium chloride  3 mL Intravenous Q12H  . tobramycin-dexamethasone  1 drop Left Eye QID      Physical Exam: The patient appears to be in no distress.  Head and neck exam reveals that the pupils are equal and reactive.  The extraocular movements are full.  There is no scleral icterus.  Mouth and pharynx are benign.  No lymphadenopathy.  No carotid bruits.  The jugular venous pressure is normal.  Thyroid is not enlarged or tender.  Chest is clear to percussion and auscultation.  No rales or rhonchi.  Expansion of the chest is symmetrical.  Heart reveals no abnormal lift or heave.  First and second heart sounds are normal.  There is no  gallop rub or click. Grade 1/6 SEM at base.  The abdomen is soft and mild epigastric tenderness.  Bowel sounds are normoactive.  There is no hepatosplenomegaly or mass.  There are no abdominal bruits.  Extremities reveal no phlebitis or  edema.  Pedal pulses are good.  There is no cyanosis or clubbing.  Neurologic exam is normal strength and no lateralizing weakness.  No sensory deficits.  Integument reveals no rash  Lab Results:  Recent Labs  03/21/13 0455 03/21/13 1214 03/21/13 1444  WBC 6.9 8.9  --   HGB 8.2* 7.4* 8.1*  PLT 233 215  --     Recent Labs  03/21/13 0455  NA 138  K 4.3  CL 96  CO2 25  GLUCOSE 96  BUN 68*  CREATININE 7.43*    Recent Labs  03/21/13 1444 03/21/13 2150  TROPONINI <0.30 0.33*   Hepatic Function Panel  Recent Labs  03/21/13 0455  PROT 6.0  ALBUMIN 3.2*  AST 14  ALT 9  ALKPHOS 86  BILITOT 0.4   No results found for this basename: CHOL,  in the last 72 hours No results found for this basename: PROTIME,  in the last 72 hours  Imaging: Dg Abd 2 Views  03/21/2013   *RADIOLOGY REPORT*  Clinical Data: Abdominal pain.  Diarrhea.  ABDOMEN - 2 VIEW  Comparison: 12/24/2012.  Findings: Radiopaque material throughout the colon most likely related to ingest material with partial filling of the diverticula. No plain film evidence of bowel obstruction or free intraperitoneal air.  Multiple surgical clips within  the pelvis greater to the right.  Cardiomegaly post CABG with coronary artery calcification.  Radiopaque structure projects over the upper liver which may be related to ribs although parenchymal calcification not excluded.  IMPRESSION: Colonic diverticula.  No plain film evidence of bowel obstruction or free intraperitoneal air.  Please see above.   Original Report Authenticated By: Genia Del, M.D.    Cardiac Studies: Telemetry shows sinus tachycardia. Assessment/Plan:  Coronary atherosclerosis of native coronary artery--Mild enzyme leak secondary to demand ischemia from GI bleed.  Essential hypertension. Systolic hypertension. Continue present meds and use IV lopressor PRN.   HLD (hyperlipidemia)--continue statin, monitor diet.   GI bleeding--EGD planned for 9/12  to look for active bleeding.   Acute blood loss anemia--S/P 2 units packed cells.  ESRD. Needs dialysis today.   LOS: 1 day    Shawna [name] 03/22/2013, 8:07 AM

## 2013-03-22 NOTE — Progress Notes (Signed)
[facility] Progress Note [place] TEAM 1 - Stepdown/ICU TEAM   Shawna [name] [id number] DOB: 07-Oct-1940 DOA: 03/21/2013 PCP: [name], MD  Brief narrative: Shawna [name] is a 72 y.o. female with a past medical history of coronary artery disease, s/p CABG in 0000000, systolic CHF, end-stage renal disease on hemodialysis Mondays Wednesdays and Fridays, currently taking Brilinta, presenting to the emergency department with complaints of bloody stools and abdominal pain. Pt has been having black stools along with bright red bloody stools.  She was found to be anemic. She was given 2 U PRBC and also FFP (reason unknown). She unfortunately developed an allergic reaction to the FFP with hypotension, tachycardia, diaphoresis.    Assessment/Plan: Principal Problem:   GI bleeding/  GERD (gastroesophageal reflux disease) - EGD next week due to Brilinta in system - re-order Dexilant - holding Brillinta   Active Problems:   Acute blood loss anemia - s/p 2 u PRBC- Hgb significantly improved - resume Iron when able  Elevated Troponins/ Coronary atherosclerosis of native coronary artery - likely demand ischemia -cards following     Essential hypertension, benign - one dose of IV Lopressor 2.5 given but holding other BP meds as she does on her usual dialysis days  Allergic reaction  -to FFP- cont Hydroxyzine (takes at home) and H2 blocker     ESRD (end stage renal disease) - notified nephro    Mitral regurgitation- severe   Chronic systolic CHF (congestive heart failure) - fluid management per renal- pt urinates a small amount    Code Status: full Family Communication: with husband Disposition Plan: follow in SDU  Consultants: GI Cards pccm   Procedures: none  Antibiotics: none  DVT prophylaxis: SCDs  HPI/Subjective: Pt sitting up in bed with numerous complaints 1 headache- frontal "maybe sinuses"- takes Claritin which does not usually help- able to breath  through nose- has chronic post-nasal drip 2. Itching in feet (possibly from allergic reaction but has it chronically as well) 3. No further bloody or black stools   Objective: Blood pressure 154/94, pulse 84, temperature 98.4 F (36.9 C), temperature source Oral, resp. rate 23, height 5\' 1"  (1.549 m), weight 65 kg (143 lb 4.8 oz), SpO2 98.00%.  Intake/Output Summary (Last 24 hours) at 03/22/13 0858 Last data filed at 03/22/13 0210  Gross per 24 hour  Intake  997.5 ml  Output      0 ml  Net  997.5 ml     Exam: General:AAO x3,  No acute respiratory distress Lungs: Clear to auscultation bilaterally without wheezes or crackles Cardiovascular: Regular rate and rhythm without murmur gallop or rub normal S1 and S2 Abdomen: tender in epigastrium and suprapubic area, nondistended, soft, bowel sounds positive, no rebound, no ascites, no appreciable mass Extremities: No significant cyanosis, clubbing, or edema bilateral lower extremities  Data Reviewed: Basic Metabolic Panel:  Recent Labs Lab 03/21/13 0455  NA 138  K 4.3  CL 96  CO2 25  GLUCOSE 96  BUN 68*  CREATININE 7.43*  CALCIUM 9.0   Liver Function Tests:  Recent Labs Lab 03/21/13 0455  AST 14  ALT 9  ALKPHOS 86  BILITOT 0.4  PROT 6.0  ALBUMIN 3.2*   No results found for this basename: LIPASE, AMYLASE,  in the last 168 hours No results found for this basename: AMMONIA,  in the last 168 hours CBC:  Recent Labs Lab 03/21/13 0455 03/21/13 1214 03/21/13 1444  WBC 6.9 8.9  --   NEUTROABS  --  7.5  --   HGB 8.2* 7.4* 8.1*  HCT 24.6* 22.2* 24.7*  MCV 92.5 92.5  --   PLT 233 215  --    Cardiac Enzymes:  Recent Labs Lab 03/21/13 1214 03/21/13 1444 03/21/13 2150  TROPONINI 0.34* <0.30 0.33*   BNP (last 3 results)  Recent Labs  09/05/12 2334 12/16/12 1810  PROBNP 5229.0* 8774.0*   CBG: No results found for this basename: GLUCAP,  in the last 168 hours  Recent Results (from the past 240 hour(s))   MRSA PCR SCREENING     Status: None   Collection Time    03/21/13  1:30 PM      Result Value Range Status   MRSA by PCR NEGATIVE  NEGATIVE Final   Comment:            The GeneXpert MRSA Assay (FDA     approved for NASAL specimens     only), is one component of a     comprehensive MRSA colonization     surveillance program. It is not     intended to diagnose MRSA     infection nor to guide or     monitor treatment for     MRSA infections.     Studies:  Recent x-ray studies have been reviewed in detail by the Attending Physician  Scheduled Meds:  Scheduled Meds: . ALPRAZolam  0.5 mg Oral TID  . amLODipine  10 mg Oral Daily  . atorvastatin  20 mg Oral q1800  . cinacalcet  30 mg Oral Q breakfast  . cloNIDine  0.3 mg Oral Daily  . famotidine (PEPCID) IV  20 mg Intravenous Q24H  . hydrALAZINE  25 mg Oral BID  . labetalol  200 mg Oral Daily  . sodium chloride  3 mL Intravenous Q12H  . tobramycin-dexamethasone  1 drop Left Eye QID   Continuous Infusions:   Time spent on care of this patient: >35 min   Debbe Odea, MD  [facility] Office  [phone] Pager - Text Page per Shea Evans as per below:  On-Call/Text Page:      [email or web address]      password [email or web address]  If 7PM-7AM, please contact night-coverage [email or web address] Password [facility] 03/22/2013, 8:58 AM   LOS: 1 day

## 2013-03-22 NOTE — Progress Notes (Signed)
Pt is anxious and has chills. NP notified. New orders received. Will continue to monitor.

## 2013-03-22 NOTE — Consult Note (Signed)
Renal Service Consult Note [facility]  Shawna [name] 03/22/2013 [place] Requesting Physician:  Dr Wynelle Cleveland  Reason for Consult:  ESRD pt with GIB and chest pain HPI: The patient is a 72 y.o. year-old with hx of HTN, ESRD, CABG last year on brillanta, hx of duodenal AVM GIB, gout presented with melena and chest pain.  Hb is in 7-8 range, not on EPO at center. Has received 2 or 3 units prbc yesterday and Hb today is pending.    ESRD is due to HTN, started dialysis 2011 and gets HD at [facility] with Dr Hinda Lenis on MWF schedule.  Access is LUA AVF.   ROS  + HA,   + subxiphoid pain  no sob or cough  no abd pain  no jt pain   no rash  no confusion  Past Medical History  Past Medical History  Diagnosis Date  . ESRD on hemodialysis     ESRD due to HTN, started dialysis 2011 and gets HD at [facility] with Dr Hinda Lenis on MWF schedule.  Access is LUA AVF as of Sept 2014.   Marland Kitchen Chronic bronchitis   . GERD (gastroesophageal reflux disease)   . PUD (peptic ulcer disease)   . History of lower GI bleeding   . Arthritis   . History of gout   . CAD (coronary artery disease)     a. 12/2011 NSTEMI/Cath/PCI LCX (2.25x14 Resolute DES) & D1 (2.25x22 Resolute DES);  b. 01/2012 Cath/PCI: LM 30, LAD 30p, 40-57m, D1 stent ok, 99 in sm branch of diag, LCX patent stent, OM1 20, RCA 95 ost (4.0x12 Promus DES), EF 55%;  c. 04/2012 Lexi Cardiolite  EF 48%, small area of scar @ base/mid inflat wall with mild peri-infarct ischemia.; CABG 12/4  . High cholesterol 12/2011  . Pneumonia ~ 2009  . Iron deficiency anemia   . TIA (transient ischemic attack)   . Anxiety   . History of blood transfusion 07/2011; 12/2011; 01/2012 X 2; 04/2012  . Carotid artery disease     a. [id number] LICA, [id number]   . Mitral regurgitation     a. Moderate by echo, 02/2012  . Myocardial infarction   . Ovarian cancer 1992  . Colon cancer 1992  . Chronic diastolic CHF (congestive heart failure)     a. 02/2012 Echo EF  60-65%, nl wall motion, Gr 1 DD, mod MR  . Hypertension    Past Surgical History  Past Surgical History  Procedure Laterality Date  . Abdominal hysterectomy  1992  . Appendectomy  06/1990  . Tubal ligation  1980's  . Av fistula placement  07/2009    left upper arm  . Thrombectomy / arteriovenous graft revision  2011    left upper arm  . Colon resection  1992  . Esophagogastroduodenoscopy  01/20/2012    Procedure: ESOPHAGOGASTRODUODENOSCOPY (EGD);  Surgeon: Ladene Artist, MD,FACG;  Location: [facility] ENDOSCOPY;  Service: Endoscopy;  Laterality: N/A;  . Dilation and curettage of uterus    . Coronary angioplasty with stent placement  12/15/11    "2"  . Coronary angioplasty with stent placement  y/2013    "1; makes total of 3" (05/02/2012)  . Coronary artery bypass graft  06/13/2012    Procedure: CORONARY ARTERY BYPASS GRAFTING (CABG);  Surgeon: Grace Isaac, MD;  Location: [place];  Service: Open Heart Surgery;  Laterality: N/A;  cabg x four;  using left internal mammary artery, and left leg greater saphenous vein harvested endoscopically  .  Intraoperative transesophageal echocardiogram  06/13/2012    Procedure: INTRAOPERATIVE TRANSESOPHAGEAL ECHOCARDIOGRAM;  Surgeon: Grace Isaac, MD;  Location: [place];  Service: Open Heart Surgery;  Laterality: N/A;  . Colectomy  1992   Family History  Family History  Problem Relation Age of Onset  . Other      noncontributory for early CAD  . Heart disease Mother     Heart Disease before age 83  . Hyperlipidemia Mother   . Hypertension Mother   . Heart disease Father   . Hyperlipidemia Father   . Hypertension Father    Social History  reports that she has never smoked. She has never used smokeless tobacco. She reports that she does not drink alcohol or use illicit drugs. Allergies  Allergies  Allergen Reactions  . Aspirin Other (See Comments)    Mess up her stomach; "makes my bowels have blood in them". Takes 81 mg EC Aspirin   . Contrast  Media [Iodinated Diagnostic Agents] Itching  . Iron Itching and Other (See Comments)    "they gave me iron in dialysis; had to give me Benadryl cause I had to have the iron" (05/02/2012)  . Macrodantin [Nitrofurantoin Macrocrystal] Other (See Comments)    "broke me out in big old knots all over my body; had to go to ER"  . Penicillins Other (See Comments)    "makes me real weak when I take it; like I'll pass out"  . Plavix [Clopidogrel Bisulfate] Rash  . Bactrim [Sulfamethoxazole W-Trimethoprim] Rash  . Sulfa Antibiotics Rash  . Venofer [Ferric Oxide] Itching    Patient reports using Benadryl prior to doses as [facility]  . Protonix [Pantoprazole Sodium] Rash   Home medications Prior to Admission medications   Medication Sig Start Date End Date Taking? Authorizing Provider  ALPRAZolam Duanne Moron) 0.5 MG tablet Take 0.5 mg by mouth 3 (three) times daily.   Yes Historical Provider, MD  amLODipine (NORVASC) 10 MG tablet Take 10 mg by mouth daily.   Yes Historical Provider, MD  atorvastatin (LIPITOR) 20 MG tablet Take 20 mg by mouth daily.   Yes Historical Provider, MD  cloNIDine (CATAPRES) 0.3 MG tablet Take 0.3 mg by mouth daily.   Yes Historical Provider, MD  dexlansoprazole (DEXILANT) 60 MG capsule Take one po 30 minutes prior to breakfast and dinner. Lot [id number] 03/12/13  Yes Willia Craze, NP  fluticasone (FLONASE) 50 MCG/ACT nasal spray Place 2 sprays into the nose daily.   Yes Historical Provider, MD  folic acid (FOLVITE) 1 MG tablet Take 1 tablet (1 mg total) by mouth daily. 07/26/12  Yes Aurora Mask, PA-C  folic acid-vitamin b complex-vitamin c-selenium-zinc (DIALYVITE) 3 MG TABS Take 1 tablet by mouth daily.   Yes Historical Provider, MD  hydrALAZINE (APRESOLINE) 25 MG tablet Take 25 mg by mouth 2 (two) times daily.   Yes Historical Provider, MD  hydrOXYzine (ATARAX/VISTARIL) 25 MG tablet Take 25 mg by mouth daily as needed for itching.    Yes Historical Provider, MD  iron  polysaccharides (NIFEREX) 150 MG capsule Take 150 mg by mouth daily.   Yes Historical Provider, MD  labetalol (NORMODYNE) 200 MG tablet Take 200 mg by mouth daily.   Yes Historical Provider, MD  lanthanum (FOSRENOL) 1000 MG chewable tablet Chew 1,000 mg by mouth 3 (three) times daily after meals.   Yes Historical Provider, MD  lidocaine-prilocaine (EMLA) cream Apply 1 application topically every Monday, Wednesday, and Friday.    Yes Historical Provider, MD  loratadine (CLARITIN) 10 MG tablet Take 10 mg by mouth daily.    Yes Historical Provider, MD  SENSIPAR 30 MG tablet Take 30 mg by mouth daily.  12/21/12  Yes Historical Provider, MD  tobramycin-dexamethasone [facility]) ophthalmic solution Place 1 drop into the left eye 4 (four) times daily.  09/28/12  Yes Historical Provider, MD  traMADol (ULTRAM) 50 MG tablet Take 50 mg by mouth every 6 (six) hours as needed for pain.  07/12/12  Yes Jessica A Hope, PA-C  albuterol (PROVENTIL HFA;VENTOLIN HFA) 108 (90 BASE) MCG/ACT inhaler Inhale 2 puffs into the lungs every 6 (six) hours as needed for wheezing or shortness of breath.     Historical Provider, MD  nitroGLYCERIN (NITROSTAT) 0.4 MG SL tablet Place 0.4 mg under the tongue every 5 (five) minutes as needed for chest pain. 10/04/12   Aurora Mask, PA-C  polyethylene glycol powder (GLYCOLAX/MIRALAX) powder Take 17 g by mouth daily. 03/12/13   Willia Craze, NP   Liver Function Tests  Recent Labs Lab 03/21/13 0455  AST 14  ALT 9  ALKPHOS 86  BILITOT 0.4  PROT 6.0  ALBUMIN 3.2*   No results found for this basename: LIPASE, AMYLASE,  in the last 168 hours CBC  Recent Labs Lab 03/21/13 0455 03/21/13 1214 03/21/13 1444  WBC 6.9 8.9  --   NEUTROABS  --  7.5  --   HGB 8.2* 7.4* 8.1*  HCT 24.6* 22.2* 24.7*  MCV 92.5 92.5  --   PLT 233 215  --    Basic Metabolic Panel  Recent Labs Lab 03/21/13 0455  NA 138  K 4.3  CL 96  CO2 25  GLUCOSE 96  BUN 68*  CREATININE 7.43*  CALCIUM 9.0    Physical Exam:  Blood pressure 190/86, pulse 110, temperature 98.4 F (36.9 C), temperature source Oral, resp. rate 23, height 5\' 1"  (1.549 m), weight 65 kg (143 lb 4.8 oz), SpO2 98.00%. Gen: alert,  Skin: no rash, cyanosis HEENT:  EOMI, sclera anicteric, throat clear Neck: no JVD, no LAN Chest: clear bilat to the bases CV: regular, no M or rub, quiet precordium Abdomen: soft, nontender, liver down 3 cm, no ascites Ext: no LE or UE edema, no joint effusion, no gangrene/ulcers Neuro: alert, Ox3, nonfocal Access: LUA AVF patent   Outpatient HD: ([facility]  MWF) 3hr 29min    Dry wt 64kg   Bath 2K/2.5Ca     Heparin 2000  LUA AVF  Profile 2   15ga sharp   Hectorol 3ug     EPO none     Venofer 50/wk  Assessment: 1. Melena / GIB- hx of duod AVM's, was on brillanta; no bleeding overnight, possible EGD soon 2. ESRD usual HD MWF 3. Anemia last Hb 8.1, pending today, not on EPO at center  4. Metabolic bone disease- cont vit D, senispar, fosrenol 5. HTN, on 4 BP meds, no vol xs, BP up 6. Hx CABG 7. Past hx colon and ovarian Ca (1992)  P- HD today now, no heparin, UF 2.5 kg, start epo, check fe/tibc   Kelly Splinter  MD Pager [phone]    Cell  [phone] 03/22/2013, 10:30 AM

## 2013-03-22 NOTE — Progress Notes (Signed)
BP 193/91. NP notified. New orders received. Will continue to monitor.

## 2013-03-23 DIAGNOSIS — K219 Gastro-esophageal reflux disease without esophagitis: Secondary | ICD-10-CM

## 2013-03-23 DIAGNOSIS — D649 Anemia, unspecified: Secondary | ICD-10-CM

## 2013-03-23 DIAGNOSIS — I10 Essential (primary) hypertension: Secondary | ICD-10-CM

## 2013-03-23 DIAGNOSIS — I779 Disorder of arteries and arterioles, unspecified: Secondary | ICD-10-CM

## 2013-03-23 LAB — CBC
HCT: 27.7 % — ABNORMAL LOW (ref 36.0–46.0)
Hemoglobin: 9.2 g/dL — ABNORMAL LOW (ref 12.0–15.0)
MCH: 30 pg (ref 26.0–34.0)
MCHC: 33.2 g/dL (ref 30.0–36.0)
RBC: 3.07 MIL/uL — ABNORMAL LOW (ref 3.87–5.11)

## 2013-03-23 MED ORDER — HYDRALAZINE HCL 25 MG PO TABS
25.0000 mg | ORAL_TABLET | Freq: Two times a day (BID) | ORAL | Status: DC
  Administered 2013-03-23 – 2013-03-27 (×6): 25 mg via ORAL
  Filled 2013-03-23 (×9): qty 1

## 2013-03-23 MED ORDER — HYDRALAZINE HCL 25 MG PO TABS: 25.0000 mg | ORAL_TABLET | Freq: Two times a day (BID) | ORAL | Status: DC

## 2013-03-23 MED ORDER — FAMOTIDINE 20 MG PO TABS
20.0000 mg | ORAL_TABLET | Freq: Every day | ORAL | Status: DC
  Administered 2013-03-23 – 2013-03-25 (×3): 20 mg via ORAL
  Filled 2013-03-23 (×3): qty 1

## 2013-03-23 MED ORDER — RENA-VITE PO TABS
1.0000 | ORAL_TABLET | Freq: Every day | ORAL | Status: DC
  Administered 2013-03-23 – 2013-03-26 (×4): 1 via ORAL
  Filled 2013-03-23 (×5): qty 1

## 2013-03-23 NOTE — Progress Notes (Signed)
[facility] Progress Note [place] TEAM 1 - Stepdown/ICU TEAM   DARRICKA MIKUS [id number] DOB: 10-Nov-1940 DOA: 03/21/2013 PCP: [name], MD  Brief narrative: Shawna [name] is a 72 y.o. female with a past medical history of coronary artery disease, s/p CABG in 0000000, systolic CHF, end-stage renal disease on hemodialysis Mondays Wednesdays and Fridays, currently taking Brilinta, presenting to the emergency department with complaints of bloody stools and abdominal pain. Pt has been having black stools along with bright red bloody stools.  She was found to be anemic. She was given 2 U PRBC and also FFP (reason unknown). She unfortunately developed an allergic reaction to the FFP with hypotension, tachycardia, diaphoresis.    Assessment/Plan: Principal Problem:   GI bleeding/  GERD (gastroesophageal reflux disease) - EGD next week due to Brilinta in system- - re-ordered Dexilant - holding Brillinta   Active Problems:   Acute blood loss anemia - s/p 2 u PRBC- Hgb significantly improved - resume Iron on d/c  Elevated Troponins/ Coronary atherosclerosis of native coronary artery - likely demand ischemia -cards following     Essential hypertension, benign - pt extermely fearful of BP dropping low and tells me she does not take Hydralazine and at time Clonidine at home- BP dropped significantly once from Clonidine- noted be on a very high dose of Clonidine - will take her off Clonidine as she will have severe rebound HTN on the days she decides to hold it.  - also noted to be on Labetalol only once a day which should be BID- will change to BID - cont Hydralazine BID - Stop Norvasc  Allergic reaction  -to FFP- cont Hydroxyzine (takes at home) and H2 blocker     ESRD (end stage renal disease) - notified nephro    Mitral regurgitation- severe   Chronic systolic CHF (congestive heart failure) - fluid management per renal- pt urinates a small amount    Code Status:  full Family Communication: with husband Disposition Plan: follow in SDU  Consultants: GI Cards pccm   Procedures: none  Antibiotics: none  DVT prophylaxis: SCDs  HPI/Subjective: Headache and itching resolved. No bloody BM in over 24 hrs. Eating without discomfort. Abd pain easing.    Objective: Blood pressure 151/71, pulse 85, temperature 98.4 F (36.9 C), temperature source Oral, resp. rate 20, height 5\' 1"  (1.549 m), weight 62.7 kg (138 lb 3.7 oz), SpO2 98.00%.  Intake/Output Summary (Last 24 hours) at 03/23/13 1727 Last data filed at 03/23/13 1300  Gross per 24 hour  Intake    480 ml  Output      0 ml  Net    480 ml     Exam: General:AAO x3,  No acute respiratory distress Lungs: Clear to auscultation bilaterally without wheezes or crackles Cardiovascular: Regular rate and rhythm without murmur gallop or rub normal S1 and S2 Abdomen: tender in epigastrium and suprapubic area, nondistended, soft, bowel sounds positive, no rebound, no ascites, no appreciable mass Extremities: No significant cyanosis, clubbing, or edema bilateral lower extremities  Data Reviewed: Basic Metabolic Panel:  Recent Labs Lab 03/21/13 0455 03/22/13 0500  NA 138 134*  K 4.3 5.2*  CL 96 93*  CO2 25 18*  GLUCOSE 96 109*  BUN 68* 95*  CREATININE 7.43* 10.52*  CALCIUM 9.0 9.2   Liver Function Tests:  Recent Labs Lab 03/21/13 0455  AST 14  ALT 9  ALKPHOS 86  BILITOT 0.4  PROT 6.0  ALBUMIN 3.2*   No results  found for this basename: LIPASE, AMYLASE,  in the last 168 hours No results found for this basename: AMMONIA,  in the last 168 hours CBC:  Recent Labs Lab 03/21/13 0455 03/21/13 1214 03/21/13 1444 03/22/13 0500 03/23/13 0444  WBC 6.9 8.9  --  14.0* 7.4  NEUTROABS  --  7.5  --   --   --   HGB 8.2* 7.4* 8.1* 10.5* 9.2*  HCT 24.6* 22.2* 24.7* 30.0* 27.7*  MCV 92.5 92.5  --  87.0 90.2  PLT 233 215  --  249 206   Cardiac Enzymes:  Recent Labs Lab  03/21/13 1214 03/21/13 1444 03/21/13 2150 03/22/13 1255  TROPONINI 0.34* <0.30 0.33* 1.31*   BNP (last 3 results)  Recent Labs  09/05/12 2334 12/16/12 1810  PROBNP 5229.0* 8774.0*   CBG: No results found for this basename: GLUCAP,  in the last 168 hours  Recent Results (from the past 240 hour(s))  MRSA PCR SCREENING     Status: None   Collection Time    03/21/13  1:30 PM      Result Value Range Status   MRSA by PCR NEGATIVE  NEGATIVE Final   Comment:            The GeneXpert MRSA Assay (FDA     approved for NASAL specimens     only), is one component of a     comprehensive MRSA colonization     surveillance program. It is not     intended to diagnose MRSA     infection nor to guide or     monitor treatment for     MRSA infections.     Studies:  Recent x-ray studies have been reviewed in detail by the Attending Physician  Scheduled Meds:  Scheduled Meds: . ALPRAZolam  0.5 mg Oral TID  . atorvastatin  20 mg Oral q1800  . cinacalcet  30 mg Oral Q breakfast  . darbepoetin (ARANESP) injection - DIALYSIS  100 mcg Intravenous Q Fri-HD  . dexlansoprazole  60 mg Oral BID AC  . famotidine (PEPCID) IV  20 mg Intravenous Q24H  . fluticasone  2 spray Each Nare Daily  . hydrALAZINE  25 mg Oral BID  . labetalol  200 mg Oral Daily  . loratadine  10 mg Oral Daily  . multivitamin  1 tablet Oral QHS  . polyethylene glycol  17 g Oral Daily  . sodium chloride  3 mL Intravenous Q12H  . tobramycin-dexamethasone  1 drop Left Eye QID   Continuous Infusions:   Time spent on care of this patient: 42 min   Debbe Odea, MD  [facility] Office  [phone] Pager - Text Page per Shea Evans as per below:  On-Call/Text Page:      [email or web address]      password [email or web address]  If 7PM-7AM, please contact night-coverage [email or web address] Password [email or web address] 03/23/2013, 5:27 PM   LOS: 2 days

## 2013-03-23 NOTE — Progress Notes (Signed)
[facility] Progress Note  Subjective:   Feels better than yesterday.  Headache gone. Anticipating EGD on Monday.  Objective Filed Vitals:   03/23/13 1105 03/23/13 1242 03/23/13 1500 03/23/13 1519  BP: 145/56 130/55  151/71  Pulse: 92   85  Temp:   98.9 F (37.2 C)   TempSrc:   Oral   Resp: 20 16  20   Height:      Weight:      SpO2: 99% 99%  98%   Physical Exam General: alert and talkative  Heart: RRR Lungs: no wheezes or rales Abdomen: soft Extremities: no edema Dialysis Access: left upper AVF patent  Outpatient HD: ([name])  3hr 30min EDW 64kg Bath 2K/2.5Ca Heparin 2000 LUA AVF Profile 2 15ga sharp  Hectorol 3ug EPO none Venofer 50/wk   Assessment/Plan:  1. Melena / GIB- hx of duod AVM's, was on brillanta; GI following for possible EGD Monday after Brillinta washout. On dexilant and IV pepcid - has protonix allergy 2. ESRD usual HD MWF 3. Anemia last Hgb up to 9.2, has not needed to be transfused; not on EPO prior to admission; given Aranesp 100 9/12 4. Metabolic bone disease- cont vit D, senispar, fosrenol 5. HTN, on 4 BP meds previously, now on hydralazine 25 bid and labetolol 200 bid, UF 2.3 Friday - got below EDW 6. Hx CABG 7. Past hx colon and ovarian Ca (1992) 8. Nutrition - on high protein renal diet - add multivit; eating well  Myriam Jacobson, PA-C [place] 03/23/2013,3:34 PM  LOS: 2 days   Patient seen and examined.  Agree with assessment and plan as above. Awaiting washout of antiplatelet agents prior to EGD on Monday. Hb stable. Plan HD on Monday , will schedule around procedure.   Kelly Splinter  MD Pager [phone]    Cell  [phone] 03/23/2013, 4:55 PM    Additional Objective Labs: Basic Metabolic Panel:  Recent Labs Lab 03/21/13 0455 03/22/13 0500  NA 138 134*  K 4.3 5.2*  CL 96 93*  CO2 25 18*  GLUCOSE 96 109*  BUN 68* 95*  CREATININE 7.43* 10.52*  CALCIUM 9.0 9.2   Liver  Function Tests:  Recent Labs Lab 03/21/13 0455  AST 14  ALT 9  ALKPHOS 86  BILITOT 0.4  PROT 6.0  ALBUMIN 3.2*   CBC:  Recent Labs Lab 03/21/13 0455 03/21/13 1214 03/21/13 1444 03/22/13 0500 03/23/13 0444  WBC 6.9 8.9  --  14.0* 7.4  NEUTROABS  --  7.5  --   --   --   HGB 8.2* 7.4* 8.1* 10.5* 9.2*  HCT 24.6* 22.2* 24.7* 30.0* 27.7*  MCV 92.5 92.5  --  87.0 90.2  PLT 233 215  --  249 206   BCardiac Enzymes:  Recent Labs Lab 03/21/13 1214 03/21/13 1444 03/21/13 2150 03/22/13 1255  TROPONINI 0.34* <0.30 0.33* 1.31*  Medications:   . ALPRAZolam  0.5 mg Oral TID  . atorvastatin  20 mg Oral q1800  . cinacalcet  30 mg Oral Q breakfast  . darbepoetin (ARANESP) injection - DIALYSIS  100 mcg Intravenous Q Fri-HD  . dexlansoprazole  60 mg Oral BID AC  . famotidine (PEPCID) IV  20 mg Intravenous Q24H  . fluticasone  2 spray Each Nare Daily  . hydrALAZINE  25 mg Oral BID  . labetalol  200 mg Oral Daily  . loratadine  10 mg Oral Daily  . polyethylene glycol  17 g  Oral Daily  . sodium chloride  3 mL Intravenous Q12H  . tobramycin-dexamethasone  1 drop Left Eye QID              +

## 2013-03-23 NOTE — Progress Notes (Signed)
ERROR

## 2013-03-23 NOTE — Progress Notes (Signed)
SUBJECTIVE: Pt denies any chest pain or SOB. About to eat lunch. Feels very well, sitting up at side of bed and in good spirits.     Intake/Output Summary (Last 24 hours) at 03/23/13 1305 Last data filed at 03/22/13 1708  Gross per 24 hour  Intake    120 ml  Output   2300 ml  Net  -2180 ml    Current Facility-Administered Medications  Medication Dose Route Frequency Provider Last Rate Last Dose  . acetaminophen (TYLENOL) tablet 650 mg  650 mg Oral Q6H PRN Kelvin Cellar, MD   650 mg at 03/22/13 1514   Or  . acetaminophen (TYLENOL) suppository 650 mg  650 mg Rectal Q6H PRN Kelvin Cellar, MD      . albuterol (PROVENTIL HFA;VENTOLIN HFA) 108 (90 BASE) MCG/ACT inhaler 2 puff  2 puff Inhalation Q6H PRN Kelvin Cellar, MD      . ALPRAZolam Duanne Moron) tablet 0.5 mg  0.5 mg Oral TID Kelvin Cellar, MD   0.5 mg at 03/23/13 0943  . atorvastatin (LIPITOR) tablet 20 mg  20 mg Oral q1800 Kelvin Cellar, MD   20 mg at 03/22/13 1707  . cinacalcet (SENSIPAR) tablet 30 mg  30 mg Oral Q breakfast Kelvin Cellar, MD   30 mg at 03/23/13 [phone]  . darbepoetin (ARANESP) injection 100 mcg  100 mcg Intravenous Q Fri-HD Sol Blazing, MD   100 mcg at 03/22/13 1318  . dexlansoprazole (DEXILANT) capsule 60 mg  60 mg Oral BID AC Debbe Odea, MD   60 mg at 03/22/13 1707  . famotidine (PEPCID) IVPB 20 mg  20 mg Intravenous Q24H Milus Banister, MD   20 mg at 03/22/13 1708  . fluticasone (FLONASE) 50 MCG/ACT nasal spray 2 spray  2 spray Each Nare Daily Debbe Odea, MD   2 spray at 03/23/13 0940  . hydrALAZINE (APRESOLINE) tablet 25 mg  25 mg Oral BID Debbe Odea, MD      . HYDROcodone-acetaminophen (NORCO/VICODIN) 5-325 MG per tablet 1-2 tablet  1-2 tablet Oral Q4H PRN Kelvin Cellar, MD      . hydrOXYzine (ATARAX/VISTARIL) tablet 25 mg  25 mg Oral TID PRN Debbe Odea, MD   25 mg at 03/22/13 2043  . labetalol (NORMODYNE) tablet 200 mg  200 mg Oral Daily Kelvin Cellar, MD   200 mg at 03/23/13 1116  .  loratadine (CLARITIN) tablet 10 mg  10 mg Oral Daily Debbe Odea, MD   10 mg at 03/23/13 0938  . morphine 2 MG/ML injection 2 mg  2 mg Intravenous Q4H PRN Kelvin Cellar, MD   2 mg at 03/22/13 1711  . nitroGLYCERIN (NITROSTAT) SL tablet 0.4 mg  0.4 mg Sublingual Q5 min PRN Kelvin Cellar, MD   0.4 mg at 03/22/13 0413  . ondansetron (ZOFRAN) injection 4 mg  4 mg Intravenous Q6H PRN Jeryl Columbia, NP   4 mg at 03/22/13 1707  . polyethylene glycol (MIRALAX / GLYCOLAX) packet 17 g  17 g Oral Daily Debbe Odea, MD   17 g at 03/23/13 1241  . sodium chloride (OCEAN) 0.65 % nasal spray 1 spray  1 spray Each Nare PRN Rhetta Mura Schorr, NP      . sodium chloride 0.9 % injection 3 mL  3 mL Intravenous Q12H Kelvin Cellar, MD   3 mL at 03/23/13 0940  . tobramycin-dexamethasone (TOBRADEX) ophthalmic suspension 1 drop  1 drop Left Eye QID Kelvin Cellar, MD   1 drop at 03/23/13 [phone]  Filed Vitals:   03/23/13 0755 03/23/13 1100 03/23/13 1105 03/23/13 1242  BP: 129/58  145/56 130/55  Pulse: 83  92   Temp:  98.8 F (37.1 C)    TempSrc:  Oral    Resp: 18  20 16   Height:      Weight:      SpO2: 99%  99% 99%    PHYSICAL EXAM General: NAD Neck: No JVD, no thyromegaly or thyroid nodule.  Lungs: Clear to auscultation bilaterally with normal respiratory effort. CV: Nondisplaced PMI.  Heart regular S1/S2, no S3/S4, soft I/VI murmur at base.  No peripheral edema.  No carotid bruit.  Normal pedal pulses.  Abdomen: Soft, nontender, no hepatosplenomegaly, no distention.  Neurologic: Alert and oriented x 3.  Psych: Normal affect. Extremities: No clubbing or cyanosis.   TELEMETRY: Reviewed telemetry pt in NSR.  LABS: Basic Metabolic Panel:  Recent Labs  03/21/13 0455 03/22/13 0500  NA 138 134*  K 4.3 5.2*  CL 96 93*  CO2 25 18*  GLUCOSE 96 109*  BUN 68* 95*  CREATININE 7.43* 10.52*  CALCIUM 9.0 9.2   Liver Function Tests:  Recent Labs  03/21/13 0455  AST 14  ALT 9  ALKPHOS 86   BILITOT 0.4  PROT 6.0  ALBUMIN 3.2*   No results found for this basename: LIPASE, AMYLASE,  in the last 72 hours CBC:  Recent Labs  03/21/13 1214  03/22/13 0500 03/23/13 0444  WBC 8.9  --  14.0* 7.4  NEUTROABS 7.5  --   --   --   HGB 7.4*  < > 10.5* 9.2*  HCT 22.2*  < > 30.0* 27.7*  MCV 92.5  --  87.0 90.2  PLT 215  --  249 206  < > = values in this interval not displayed. Cardiac Enzymes:  Recent Labs  03/21/13 1444 03/21/13 2150 03/22/13 1255  TROPONINI <0.30 0.33* 1.31*   BNP: No components found with this basename: POCBNP,  D-Dimer: No results found for this basename: DDIMER,  in the last 72 hours Hemoglobin A1C: No results found for this basename: HGBA1C,  in the last 72 hours Fasting Lipid Panel: No results found for this basename: CHOL, HDL, LDLCALC, TRIG, CHOLHDL, LDLDIRECT,  in the last 72 hours Thyroid Function Tests: No results found for this basename: TSH, T4TOTAL, FREET3, T3FREE, THYROIDAB,  in the last 72 hours Anemia Panel: No results found for this basename: VITAMINB12, FOLATE, FERRITIN, TIBC, IRON, RETICCTPCT,  in the last 72 hours  RADIOLOGY: Dg Abd 2 Views  03/21/2013   *RADIOLOGY REPORT*  Clinical Data: Abdominal pain.  Diarrhea.  ABDOMEN - 2 VIEW  Comparison: 12/24/2012.  Findings: Radiopaque material throughout the colon most likely related to ingest material with partial filling of the diverticula. No plain film evidence of bowel obstruction or free intraperitoneal air.  Multiple surgical clips within the pelvis greater to the right.  Cardiomegaly post CABG with coronary artery calcification.  Radiopaque structure projects over the upper liver which may be related to ribs although parenchymal calcification not excluded.  IMPRESSION: Colonic diverticula.  No plain film evidence of bowel obstruction or free intraperitoneal air.  Please see above.   Original Report Authenticated By: Genia Del, M.D.      ASSESSMENT AND PLAN: 1. CAD s/p  multivessel stenting and CABG: given marked anemia at time of discharge, I recommend continuing to withhold antiplatelet therapy (ASA, Brilinta), and feel the elevated troponin of 1.31 on 9/12 is likely secondary to demand ischemia.  She is currently asymptomatic and doing well. Continue Lipitor. 2. HTN: controlled on hydralazine and labetalol. 3. Anemia: plan for EGD on Monday as per GI.   Kate Sable, M.D., F.A.C.C.

## 2013-03-23 NOTE — Progress Notes (Signed)
Patient ID: Shawna [name], female   DOB: June 13, 1941, 72 y.o.   MRN: [id number] [facility] Progress Note  Subjective: Feels OK, lots of questions. No BM since admit. Tolerating full liquids- says her upper abdomen has been bothering her for several weeks,worse at night, intermittent vomiting at home past few weeks.  Objective:  Vital signs in last 24 hours: Temp:  [97.3 F (36.3 C)-99.1 F (37.3 C)] 98.8 F (37.1 C) (09/13 1100) Pulse Rate:  [81-108] 83 (09/13 0755) Resp:  [13-22] 18 (09/13 0755) BP: (97-217)/(45-99) 129/58 mmHg (09/13 0755) SpO2:  [94 %-99 %] 99 % (09/13 0755) Weight:  [138 lb 3.7 oz (62.7 kg)-145 lb 8.1 oz (66 kg)] 138 lb 3.7 oz (62.7 kg) (09/12 1624) Last BM Date: 03/21/13 General:   Alert,  Well-developed,AA    in NAD Heart:  Regular rate and rhythm; no murmurs Pulm;cleAR Abdomen:  Soft, mildly tender epigastrium,and nondistended. Normal bowel sounds, without guarding, and without rebound.   Extremities:  Without edema. Neurologic:  Alert and  oriented x4;  grossly normal neurologically. Psych:  Alert and cooperative. Normal mood and affect.  Intake/Output from previous day: 09/12 0701 - 09/13 0700 In: 183 [P.O.:180; I.V.:3] Out: 2300  Intake/Output this shift:    Lab Results:  Recent Labs  03/21/13 1214 03/21/13 1444 03/22/13 0500 03/23/13 0444  WBC 8.9  --  14.0* 7.4  HGB 7.4* 8.1* 10.5* 9.2*  HCT 22.2* 24.7* 30.0* 27.7*  PLT 215  --  249 206   BMET  Recent Labs  03/21/13 0455 03/22/13 0500  NA 138 134*  K 4.3 5.2*  CL 96 93*  CO2 25 18*  GLUCOSE 96 109*  BUN 68* 95*  CREATININE 7.43* 10.52*  CALCIUM 9.0 9.2   LFT  Recent Labs  03/21/13 0455  PROT 6.0  ALBUMIN 3.2*  AST 14  ALT 9  ALKPHOS 55  BILITOT 0.4    Assessment / Plan: #1 72 yo female with acute  Gi bleed ,likley upper in setting of brillinta and aspirin -  Stable -without further bleeding Continue Dexilant Holding brillinta  Since admit-plan is for  EGD Monday Full liquids #2 ESRD -on dialysis #3 CAD -s/p CABG Principal Problem:   GI bleeding Active Problems:   Coronary atherosclerosis of native coronary artery   Essential hypertension, benign   ESRD on hemodialysis   HLD (hyperlipidemia)   GERD (gastroesophageal reflux disease)   Mitral regurgitation   Abdominal  pain, other specified site   Acute blood loss anemia   Chronic systolic CHF (congestive heart failure)     LOS: 2 days   Amy Esterwood  03/23/2013, 11:46 AM   GI ATTENDING  No GI bleeding since admission. Hemoglobin stable. Patient seen and examined. Agree with H&P as outlined above by advanced extender. Plan EGD with possible therapy on Monday after Brilinta washout. Continue PPI for now.  Docia Chuck. Geri Seminole., M.D. [facility] Division of Gastroenterology

## 2013-03-24 LAB — CBC
MCHC: 33.6 g/dL (ref 30.0–36.0)
Platelets: 224 10*3/uL (ref 150–400)
RDW: 17.4 % — ABNORMAL HIGH (ref 11.5–15.5)
WBC: 8.2 10*3/uL (ref 4.0–10.5)

## 2013-03-24 LAB — TROPONIN I: Troponin I: 0.57 ng/mL (ref <0.30)

## 2013-03-24 MED ORDER — LABETALOL HCL 5 MG/ML IV SOLN
20.0000 mg | Freq: Once | INTRAVENOUS | Status: AC
  Administered 2013-03-24: 20 mg via INTRAVENOUS
  Filled 2013-03-24: qty 4

## 2013-03-24 MED ORDER — LANTHANUM CARBONATE 500 MG PO CHEW
1000.0000 mg | CHEWABLE_TABLET | Freq: Three times a day (TID) | ORAL | Status: DC
  Administered 2013-03-24 – 2013-03-27 (×7): 1000 mg via ORAL
  Filled 2013-03-24 (×13): qty 2

## 2013-03-24 MED ORDER — LABETALOL HCL 200 MG PO TABS
200.0000 mg | ORAL_TABLET | Freq: Two times a day (BID) | ORAL | Status: DC
  Administered 2013-03-24 – 2013-03-25 (×2): 200 mg via ORAL
  Filled 2013-03-24 (×3): qty 1

## 2013-03-24 NOTE — Significant Event (Signed)
Nitro 0.4  sublingual given again at 2042. Labetalol 200mg and Hydralazine 25 mg also given by mouth. EKG completed and reviewed by Rapid Response( April) Blood pressure rechecked at 2045 230/109. Previous Blood pressure at 2037 was 233/110

## 2013-03-24 NOTE — Significant Event (Signed)
Pain level at 2053 is 8/10. blood pressure is 223/109

## 2013-03-24 NOTE — Progress Notes (Signed)
Shawna [name] is a 72 y.o. female patient who transferred  Lloyd awake, alert  & orientated  X 3, Full Code, VSS - Blood pressure 189/74, pulse 84, temperature 98.6 F (37 C), temperature source Oral, resp. rate 18, height 5\' 1"  (1.549 m), weight 57.6 kg (126 lb 15.8 oz), SpO2 97.00%.,, no c/o shortness of breath, no c/o chest pain, no distress noted.    IV site WDL: wrist right, condition patent and no redness with a transparent dsg that's clean dry and intact.  Allergies:   Allergies  Allergen Reactions  . Aspirin Other (See Comments)    Mess up her stomach; "makes my bowels have blood in them". Takes 81 mg EC Aspirin   . Contrast Media [Iodinated Diagnostic Agents] Itching  . Iron Itching and Other (See Comments)    "they gave me iron in dialysis; had to give me Benadryl cause I had to have the iron" (05/02/2012)  . Macrodantin [Nitrofurantoin Macrocrystal] Other (See Comments)    "broke me out in big old knots all over my body; had to go to ER"  . Penicillins Other (See Comments)    "makes me real weak when I take it; like I'll pass out"  . Plavix [Clopidogrel Bisulfate] Rash  . Bactrim [Sulfamethoxazole W-Trimethoprim] Rash  . Sulfa Antibiotics Rash  . Venofer [Ferric Oxide] Itching    Patient reports using Benadryl prior to doses as [facility]  . Protonix [Pantoprazole Sodium] Rash     Past Medical History  Diagnosis Date  . ESRD on hemodialysis     ESRD due to HTN, started dialysis 2011 and gets HD at [facility] with Dr Hinda Lenis on MWF schedule.  Access is LUA AVF as of Sept 2014.   Marland Kitchen Chronic bronchitis   . GERD (gastroesophageal reflux disease)   . PUD (peptic ulcer disease)   . History of lower GI bleeding   . Arthritis   . History of gout   . CAD (coronary artery disease)     a. 12/2011 NSTEMI/Cath/PCI LCX (2.25x14 Resolute DES) & D1 (2.25x22 Resolute DES);  b. 01/2012 Cath/PCI: LM 30, LAD 30p, 40-62m, D1 stent ok, 99 in sm branch of diag, LCX patent stent, OM1 20,  RCA 95 ost (4.0x12 Promus DES), EF 55%;  c. 04/2012 Lexi Cardiolite  EF 48%, small area of scar @ base/mid inflat wall with mild peri-infarct ischemia.; CABG 12/4  . High cholesterol 12/2011  . Pneumonia ~ 2009  . Iron deficiency anemia   . TIA (transient ischemic attack)   . Anxiety   . History of blood transfusion 07/2011; 12/2011; 01/2012 X 2; 04/2012  . Carotid artery disease     a. [id number] LICA, [id number]   . Mitral regurgitation     a. Moderate by echo, 02/2012  . Myocardial infarction   . Ovarian cancer 1992  . Colon cancer 1992  . Chronic diastolic CHF (congestive heart failure)     a. 02/2012 Echo EF 60-65%, nl wall motion, Gr 1 DD, mod MR  . Hypertension     Pt orientation to unit, room and routine. SR up x 2, fall risk assessment complete with Patient and family verbalizing understanding of risks associated with falls. Pt verbalizes an understanding of how to use the call bell and to call for help before getting out of bed.  Skin, clean-dry- intact without evidence of bruising, or skin tears.   No evidence of skin break down noted on exam.     Will  cont to monitor and assist as needed.  Park Breed, RN 03/24/2013 3:32 PM

## 2013-03-24 NOTE — Progress Notes (Signed)
Patient ID: Shawna [name], female   DOB: [birth year], 72 y.o.   MRN: [id number] [facility] Progress Note  Subjective: Feels pretty good, no c/o -no further melena hgb 9.8 For dialysis in am  Objective:  Vital signs in last 24 hours: Temp:  [98.1 F (36.7 C)-98.9 F (37.2 C)] 98.5 F (36.9 C) (09/14 0710) Pulse Rate:  [82-92] 92 (09/14 0710) Resp:  [16-22] 19 (09/14 0710) BP: (129-178)/(55-78) 159/78 mmHg (09/14 0710) SpO2:  [96 %-99 %] 99 % (09/14 0710) Weight:  [126 lb 15.8 oz (57.6 kg)] 126 lb 15.8 oz (57.6 kg) (09/14 0357) Last BM Date: 03/23/13 General:   Alert,  Well-developed,  AA female  in NAD Heart:  Regular rate and rhythm; no murmurs,sternal scar Pulm;clear Abdomen:  Soft, nontender and nondistended. Normal bowel sounds, without guarding, and without rebound.   Extremities:  Without edema. Neurologic:  Alert and  oriented x4;  grossly normal neurologically. Psych:  Alert and cooperative. Normal mood and affect.  Intake/Output from previous day: 09/13 0701 - 09/14 0700 In: 21 [P.O.:480] Out: 0  Intake/Output this shift:    Lab Results:  Recent Labs  03/22/13 0500 03/23/13 0444 03/24/13 0900  WBC 14.0* 7.4 8.2  HGB 10.5* 9.2* 9.8*  HCT 30.0* 27.7* 29.2*  PLT 249 206 224   BMET  Recent Labs  03/22/13 0500  NA 134*  K 5.2*  CL 93*  CO2 18*  GLUCOSE 109*  BUN 95*  CREATININE 10.52*  CALCIUM 9.2      Assessment / Plan: #1  72 yo female with melena and anemia in setting of ASA and Brillinta- For EGD in am tomorrow- off Brillinta since admit Stable, no active bleeding past 48 hours and hgb on the riise #2 ESRD - dialysis in am #3 CAD s/p CABG  Principal Problem:   GI bleeding Active Problems:   Coronary atherosclerosis of native coronary artery   Essential hypertension, benign   ESRD on hemodialysis   HLD (hyperlipidemia)   GERD (gastroesophageal reflux disease)   Mitral regurgitation   Abdominal  pain, other specified  site   Acute blood loss anemia   Chronic systolic CHF (congestive heart failure)     LOS: 3 days   Amy Esterwood  03/24/2013, 11:44 AM   GI ATTENDING  Interval laboratories and history reviewed. Agree with H&P as outlined above. Plans for endoscopy/enteroscopy tomorrow.  Docia Chuck. Geri Seminole., M.D. [facility] Division of Gastroenterology

## 2013-03-24 NOTE — Significant Event (Signed)
Pain level at 2102 is 2. Patient starting praising GOD and said' its easing up. I feel much better" Due meds given as per order. Blood pressure 222/104, pulse 93, O2 sat 97% on room air, temp 98.2

## 2013-03-24 NOTE — Progress Notes (Signed)
Complained of chest pain, gnawing pain, 9/10 , across her chest underneath her breast radiating to her back. Rapid  Response team(April) made aware  Nitro 0.4 sublingual given at 2031

## 2013-03-24 NOTE — Progress Notes (Signed)
[facility] Progress Note Bee TEAM 1 - Stepdown/ICU TEAM   Shawna [name] [id number] DOB: 1940-11-06 DOA: 03/21/2013 PCP: [name], MD  Brief narrative: Shawna [name] is a 72 y.o. female with a past medical history of coronary artery disease, s/p CABG in 0000000, systolic CHF, end-stage renal disease on hemodialysis Mondays Wednesdays and Fridays, currently taking Brilinta, presenting to the emergency department with complaints of bloody stools and abdominal pain. Pt has been having black stools along with bright red bloody stools.  She was found to be anemic. She was given 2 U PRBC and also FFP (reason unknown). She unfortunately developed an allergic reaction to the FFP with hypotension, tachycardia, diaphoresis.    Assessment/Plan: Principal Problem:   GI bleeding/  GERD (gastroesophageal reflux disease) - EGD next week due to Brilinta in system- - re-ordered Dexilant - holding Brillinta  - appears to be passing old blood  Active Problems:   Acute blood loss anemia - s/p 2 u PRBC- Hgb significantly improved and has been stable since yesterday - resume Iron on d/c  Elevated Troponins/ Coronary atherosclerosis of native coronary artery - likely demand ischemia -cards following     Essential hypertension, benign - pt extermely fearful of BP dropping low and tells me she does not take Hydralazine and at time Clonidine at home- BP dropped significantly once from Clonidine- noted be on a very high dose of Clonidine - will take her off Clonidine as she will have severe rebound HTN on the days she decides to hold it.  - also noted to be on Labetalol only once a day which should be BID- will change to BID - cont Hydralazine BID - Stop Norvasc for now  Allergic reaction  -to FFP- cont Hydroxyzine (takes at home) and H2 blocker     ESRD (end stage renal disease) - notified nephro    Mitral regurgitation- severe   Chronic systolic CHF (congestive heart failure) -  fluid management per renal- pt urinates a small amount    Code Status: full Family Communication: with husband Disposition Plan: transfer to med/surg  Consultants: GI Cards pccm   Procedures: none  Antibiotics: none  DVT prophylaxis: SCDs  HPI/Subjective: Passed dark blood yesterday x 1- none since. Eating well. No complaints.    Objective: Blood pressure 159/78, pulse 92, temperature 98.5 F (36.9 C), temperature source Oral, resp. rate 19, height 5\' 1"  (1.549 m), weight 57.6 kg (126 lb 15.8 oz), SpO2 99.00%.  Intake/Output Summary (Last 24 hours) at 03/24/13 1032 Last data filed at 03/24/13 0357  Gross per 24 hour  Intake    240 ml  Output      0 ml  Net    240 ml     Exam: General:AAO x3,  No acute respiratory distress Lungs: Clear to auscultation bilaterally without wheezes or crackles Cardiovascular: Regular rate and rhythm without murmur gallop or rub normal S1 and S2 Abdomen: tender in epigastrium and suprapubic area, nondistended, soft, bowel sounds positive, no rebound, no ascites, no appreciable mass Extremities: No significant cyanosis, clubbing, or edema bilateral lower extremities  Data Reviewed: Basic Metabolic Panel:  Recent Labs Lab 03/21/13 0455 03/22/13 0500  NA 138 134*  K 4.3 5.2*  CL 96 93*  CO2 25 18*  GLUCOSE 96 109*  BUN 68* 95*  CREATININE 7.43* 10.52*  CALCIUM 9.0 9.2   Liver Function Tests:  Recent Labs Lab 03/21/13 0455  AST 14  ALT 9  ALKPHOS 86  BILITOT 0.4  PROT 6.0  ALBUMIN 3.2*   No results found for this basename: LIPASE, AMYLASE,  in the last 168 hours No results found for this basename: AMMONIA,  in the last 168 hours CBC:  Recent Labs Lab 03/21/13 0455 03/21/13 1214 03/21/13 1444 03/22/13 0500 03/23/13 0444 03/24/13 0900  WBC 6.9 8.9  --  14.0* 7.4 8.2  NEUTROABS  --  7.5  --   --   --   --   HGB 8.2* 7.4* 8.1* 10.5* 9.2* 9.8*  HCT 24.6* 22.2* 24.7* 30.0* 27.7* 29.2*  MCV 92.5 92.5  --  87.0  90.2 90.4  PLT 233 215  --  249 206 224   Cardiac Enzymes:  Recent Labs Lab 03/21/13 1214 03/21/13 1444 03/21/13 2150 03/22/13 1255  TROPONINI 0.34* <0.30 0.33* 1.31*   BNP (last 3 results)  Recent Labs  09/05/12 2334 12/16/12 1810  PROBNP 5229.0* 8774.0*   CBG: No results found for this basename: GLUCAP,  in the last 168 hours  Recent Results (from the past 240 hour(s))  MRSA PCR SCREENING     Status: None   Collection Time    03/21/13  1:30 PM      Result Value Range Status   MRSA by PCR NEGATIVE  NEGATIVE Final   Comment:            The GeneXpert MRSA Assay (FDA     approved for NASAL specimens     only), is one component of a     comprehensive MRSA colonization     surveillance program. It is not     intended to diagnose MRSA     infection nor to guide or     monitor treatment for     MRSA infections.     Studies:  Recent x-ray studies have been reviewed in detail by the Attending Physician  Scheduled Meds:  Scheduled Meds: . ALPRAZolam  0.5 mg Oral TID  . atorvastatin  20 mg Oral q1800  . cinacalcet  30 mg Oral Q breakfast  . darbepoetin (ARANESP) injection - DIALYSIS  100 mcg Intravenous Q Fri-HD  . dexlansoprazole  60 mg Oral BID AC  . famotidine  20 mg Oral Daily  . fluticasone  2 spray Each Nare Daily  . hydrALAZINE  25 mg Oral BID  . labetalol  200 mg Oral Daily  . lanthanum  1,000 mg Oral TID WC  . loratadine  10 mg Oral Daily  . multivitamin  1 tablet Oral QHS  . polyethylene glycol  17 g Oral Daily  . sodium chloride  3 mL Intravenous Q12H  . tobramycin-dexamethasone  1 drop Left Eye QID   Continuous Infusions:   Time spent on care of this patient: 25 min   Debbe Odea, MD  [facility] Office  [phone] Pager - Text Page per Shea Evans as per below:  On-Call/Text Page:      [email or web address]      password [email or web address]  If 7PM-7AM, please contact night-coverage [email or web address] Password [email or web address] 03/24/2013, 10:32 AM   LOS: 3 days

## 2013-03-24 NOTE — Significant Event (Signed)
Nitro 0.4 given at 2051. Rapid Response( April) made aware of blood pressure 230/109. Gave new order of labetolol 20mg  IV

## 2013-03-24 NOTE — Progress Notes (Signed)
SUBJECTIVE: Pt in good spirits, and denies chest pain, palpitations, and shortness of breath.     Intake/Output Summary (Last 24 hours) at 03/24/13 1111 Last data filed at 03/24/13 0357  Gross per 24 hour  Intake    240 ml  Output      0 ml  Net    240 ml    Current Facility-Administered Medications  Medication Dose Route Frequency Provider Last Rate Last Dose  . acetaminophen (TYLENOL) tablet 650 mg  650 mg Oral Q6H PRN Kelvin Cellar, MD   650 mg at 03/22/13 1514   Or  . acetaminophen (TYLENOL) suppository 650 mg  650 mg Rectal Q6H PRN Kelvin Cellar, MD      . albuterol (PROVENTIL HFA;VENTOLIN HFA) 108 (90 BASE) MCG/ACT inhaler 2 puff  2 puff Inhalation Q6H PRN Kelvin Cellar, MD      . ALPRAZolam Duanne Moron) tablet 0.5 mg  0.5 mg Oral TID Kelvin Cellar, MD   0.5 mg at 03/24/13 [id number]  . atorvastatin (LIPITOR) tablet 20 mg  20 mg Oral q1800 Kelvin Cellar, MD   20 mg at 03/23/13 1837  . cinacalcet (SENSIPAR) tablet 30 mg  30 mg Oral Q breakfast Kelvin Cellar, MD   30 mg at 03/24/13 0900  . darbepoetin (ARANESP) injection 100 mcg  100 mcg Intravenous Q Fri-HD Sol Blazing, MD   100 mcg at 03/22/13 1318  . dexlansoprazole (DEXILANT) capsule 60 mg  60 mg Oral BID AC Debbe Odea, MD   60 mg at 03/24/13 0900  . famotidine (PEPCID) tablet 20 mg  20 mg Oral Daily Rhetta Mura Schorr, NP   20 mg at 03/24/13 [id number]  . fluticasone (FLONASE) 50 MCG/ACT nasal spray 2 spray  2 spray Each Nare Daily Debbe Odea, MD   2 spray at 03/24/13 0938  . hydrALAZINE (APRESOLINE) tablet 25 mg  25 mg Oral BID Debbe Odea, MD   25 mg at 03/24/13 0938  . HYDROcodone-acetaminophen (NORCO/VICODIN) 5-325 MG per tablet 1-2 tablet  1-2 tablet Oral Q4H PRN Kelvin Cellar, MD      . hydrOXYzine (ATARAX/VISTARIL) tablet 25 mg  25 mg Oral TID PRN Debbe Odea, MD   25 mg at 03/22/13 2043  . labetalol (NORMODYNE) tablet 200 mg  200 mg Oral Daily Kelvin Cellar, MD   200 mg at 03/23/13 1116  . lanthanum  (FOSRENOL) chewable tablet 1,000 mg  1,000 mg Oral TID WC Debbe Odea, MD      . loratadine (CLARITIN) tablet 10 mg  10 mg Oral Daily Debbe Odea, MD   10 mg at 03/24/13 0938  . morphine 2 MG/ML injection 2 mg  2 mg Intravenous Q4H PRN Kelvin Cellar, MD   2 mg at 03/22/13 1711  . multivitamin (RENA-VIT) tablet 1 tablet  1 tablet Oral QHS Myriam Jacobson, PA-C   1 tablet at 03/23/13 2209  . nitroGLYCERIN (NITROSTAT) SL tablet 0.4 mg  0.4 mg Sublingual Q5 min PRN Kelvin Cellar, MD   0.4 mg at 03/22/13 0413  . ondansetron (ZOFRAN) injection 4 mg  4 mg Intravenous Q6H PRN Jeryl Columbia, NP   4 mg at 03/22/13 1707  . polyethylene glycol (MIRALAX / GLYCOLAX) packet 17 g  17 g Oral Daily Debbe Odea, MD   17 g at 03/24/13 0938  . sodium chloride (OCEAN) 0.65 % nasal spray 1 spray  1 spray Each Nare PRN Rhetta Mura Schorr, NP      . sodium chloride 0.9 % injection  3 mL  3 mL Intravenous Q12H Kelvin Cellar, MD   3 mL at 03/24/13 0938  . tobramycin-dexamethasone (TOBRADEX) ophthalmic suspension 1 drop  1 drop Left Eye QID Kelvin Cellar, MD   1 drop at 03/24/13 0938    Filed Vitals:   03/23/13 1944 03/24/13 0021 03/24/13 0357 03/24/13 0710  BP: 158/69 178/72 129/70 159/78  Pulse: 86 89 82 92  Temp: 98.8 F (37.1 C) 98.1 F (36.7 C) 98.8 F (37.1 C) 98.5 F (36.9 C)  TempSrc: Oral Oral Oral Oral  Resp: 22 16 16 19   Height:      Weight:   126 lb 15.8 oz (57.6 kg)   SpO2: 96% 97% 96% 99%    PHYSICAL EXAM General: NAD Neck: No JVD, no thyromegaly or thyroid nodule.  Lungs: Clear to auscultation bilaterally with normal respiratory effort. CV: Nondisplaced PMI.  Heart regular S1/S2, no S3/S4, no murmur.  No peripheral edema.  No carotid bruit.  Normal pedal pulses.  Abdomen: Soft, nontender, no hepatosplenomegaly, no distention.  Neurologic: Alert and oriented x 3.  Psych: Normal affect. Extremities: No clubbing or cyanosis.   TELEMETRY: Reviewed telemetry pt in sinus rhythm,  isolated PVC, ST depression  LABS: Basic Metabolic Panel:  Recent Labs  03/22/13 0500  NA 134*  K 5.2*  CL 93*  CO2 18*  GLUCOSE 109*  BUN 95*  CREATININE 10.52*  CALCIUM 9.2   Liver Function Tests: No results found for this basename: AST, ALT, ALKPHOS, BILITOT, PROT, ALBUMIN,  in the last 72 hours No results found for this basename: LIPASE, AMYLASE,  in the last 72 hours CBC:  Recent Labs  03/21/13 1214  03/23/13 0444 03/24/13 0900  WBC 8.9  < > 7.4 8.2  NEUTROABS 7.5  --   --   --   HGB 7.4*  < > 9.2* 9.8*  HCT 22.2*  < > 27.7* 29.2*  MCV 92.5  < > 90.2 90.4  PLT 215  < > 206 224  < > = values in this interval not displayed. Cardiac Enzymes:  Recent Labs  03/21/13 1444 03/21/13 2150 03/22/13 1255  TROPONINI <0.30 0.33* 1.31*   BNP: No components found with this basename: POCBNP,  D-Dimer: No results found for this basename: DDIMER,  in the last 72 hours Hemoglobin A1C: No results found for this basename: HGBA1C,  in the last 72 hours Fasting Lipid Panel: No results found for this basename: CHOL, HDL, LDLCALC, TRIG, CHOLHDL, LDLDIRECT,  in the last 72 hours Thyroid Function Tests: No results found for this basename: TSH, T4TOTAL, FREET3, T3FREE, THYROIDAB,  in the last 72 hours Anemia Panel: No results found for this basename: VITAMINB12, FOLATE, FERRITIN, TIBC, IRON, RETICCTPCT,  in the last 72 hours  RADIOLOGY: Dg Abd 2 Views  03/21/2013   *RADIOLOGY REPORT*  Clinical Data: Abdominal pain.  Diarrhea.  ABDOMEN - 2 VIEW  Comparison: 12/24/2012.  Findings: Radiopaque material throughout the colon most likely related to ingest material with partial filling of the diverticula. No plain film evidence of bowel obstruction or free intraperitoneal air.  Multiple surgical clips within the pelvis greater to the right.  Cardiomegaly post CABG with coronary artery calcification.  Radiopaque structure projects over the upper liver which may be related to ribs although  parenchymal calcification not excluded.  IMPRESSION: Colonic diverticula.  No plain film evidence of bowel obstruction or free intraperitoneal air.  Please see above.   Original Report Authenticated By: Genia Del, M.D.  ASSESSMENT AND PLAN: 1. CAD s/p multivessel stenting and CABG: given marked anemia at time of discharge, I recommend continuing to withhold antiplatelet therapy (ASA, Brilinta), and feel the elevated troponin of 1.31 on 9/12 is likely secondary to demand ischemia. She is currently asymptomatic and doing well. Continue Lipitor.  2. HTN: slightly uncontrolled today, with HD planned for 9/15. Will continue hydralazine bid and increase labetalol to bid.  3. Anemia: plan for EGD on Monday as per GI.    Kate Sable, M.D., F.A.C.C.

## 2013-03-24 NOTE — Progress Notes (Signed)
[facility] Progress Note  Subjective:   No c/o. Feels well. Reports memory issues since open heart surgery.  Objective Filed Vitals:   03/23/13 1944 03/24/13 0021 03/24/13 0357 03/24/13 0710  BP: 158/69 178/72 129/70 159/78  Pulse: 86 89 82 92  Temp: 98.8 F (37.1 C) 98.1 F (36.7 C) 98.8 F (37.1 C) 98.5 F (36.9 C)  TempSrc: Oral Oral Oral Oral  Resp: 22 16 16 19   Height:      Weight:   57.6 kg (126 lb 15.8 oz)   SpO2: 96% 97% 96% 99%   Physical Exam General: sitting on side of the bed reading the paper; looks good Heart: RRR Lungs: no wheezes or rales Abdomen: soft Extremities: no LE edema Dialysis Access:  Left upper AVF patent  Outpatient HD: ([name])  3hr 78min EDW 64kg Bath 2K/2.5Ca Heparin 2000 LUA AVF Profile 2 15ga sharp  Hectorol 3ug EPO none Venofer 50/wk   Assessment/Plan: 1. Melena / GIB- hx of duod AVM's, was on brillanta; GI following for possible EGD Monday after Brillinta washout. On dexilant. IV pepcid changed to po 9/13- has protonix allergy 2. ESRD usual HD MWF - plan for tomorrow schedule around EGD; have written for no heparin, but may be able to use after EGD pending findings and if Hgb continues to be stable 3. Anemia last Hgb up to 9.8 - stable, has not needed to be transfused; not on EPO prior to admission; given Aranesp 100 9/12 4. Metabolic bone disease- cont vit D, senispar, fosrenol 5. HTN, on 4 BP meds previously, now on hydralazine 25 bid and labetolol 200 bid, UF 2.3 Friday - got below EDW - weight this am likely an error - get standing weights with orthostatics pre and post HD 6. Hx CABG 7. Past hx colon and ovarian Ca (1992) 8. Nutrition - on high protein renal diet - add multivit; eating well  Myriam Jacobson, PA-C [place] 03/24/2013,10:18 AM  LOS: 3 days   Patient seen and examined.  Agree with assessment and plan as above. Kelly Splinter  MD Pager [phone]    Cell  [phone] 03/24/2013, 11:57 AM    Additional Objective Labs: Basic Metabolic Panel:  Recent Labs Lab 03/21/13 0455 03/22/13 0500  NA 138 134*  K 4.3 5.2*  CL 96 93*  CO2 25 18*  GLUCOSE 96 109*  BUN 68* 95*  CREATININE 7.43* 10.52*  CALCIUM 9.0 9.2   Liver Function Tests:  Recent Labs Lab 03/21/13 0455  AST 14  ALT 9  ALKPHOS 86  BILITOT 0.4  PROT 6.0  ALBUMIN 3.2*   CBC:  Recent Labs Lab 03/21/13 0455 03/21/13 1214  03/22/13 0500 03/23/13 0444 03/24/13 0900  WBC 6.9 8.9  --  14.0* 7.4 8.2  NEUTROABS  --  7.5  --   --   --   --   HGB 8.2* 7.4*  < > 10.5* 9.2* 9.8*  HCT 24.6* 22.2*  < > 30.0* 27.7* 29.2*  MCV 92.5 92.5  --  87.0 90.2 90.4  PLT 233 215  --  249 206 224  < > = values in this interval not displayed.  Cardiac Enzymes:  Recent Labs Lab 03/21/13 1214 03/21/13 1444 03/21/13 2150 03/22/13 1255  TROPONINI 0.34* <0.30 0.33* 1.31*  Medications:   . ALPRAZolam  0.5 mg Oral TID  . atorvastatin  20 mg Oral q1800  . cinacalcet  30 mg Oral Q breakfast  .  darbepoetin (ARANESP) injection - DIALYSIS  100 mcg Intravenous Q Fri-HD  . dexlansoprazole  60 mg Oral BID AC  . famotidine  20 mg Oral Daily  . fluticasone  2 spray Each Nare Daily  . hydrALAZINE  25 mg Oral BID  . labetalol  200 mg Oral Daily  . lanthanum  1,000 mg Oral TID PC  . loratadine  10 mg Oral Daily  . multivitamin  1 tablet Oral QHS  . polyethylene glycol  17 g Oral Daily  . sodium chloride  3 mL Intravenous Q12H  . tobramycin-dexamethasone  1 drop Left Eye QID

## 2013-03-24 NOTE — Significant Event (Signed)
Labetalol given as per order at 2141. BP rechecked AT 2203 199/81. Resting comfortably with no further complaints

## 2013-03-25 LAB — CBC
HCT: 26 % — ABNORMAL LOW (ref 36.0–46.0)
Hemoglobin: 8.9 g/dL — ABNORMAL LOW (ref 12.0–15.0)
Hemoglobin: 8.7 g/dL — ABNORMAL LOW (ref 12.0–15.0)
MCH: 30.4 pg (ref 26.0–34.0)
MCH: 29.7 pg (ref 26.0–34.0)
MCHC: 33.5 g/dL (ref 30.0–36.0)
MCV: 88.7 fL (ref 78.0–100.0)
Platelets: 218 10*3/uL (ref 150–400)
Platelets: 229 10*3/uL (ref 150–400)
RBC: 2.93 MIL/uL — ABNORMAL LOW (ref 3.87–5.11)
RBC: 2.93 MIL/uL — ABNORMAL LOW (ref 3.87–5.11)
RDW: 16.8 % — ABNORMAL HIGH (ref 11.5–15.5)
WBC: 8 10*3/uL (ref 4.0–10.5)
WBC: 9.1 10*3/uL (ref 4.0–10.5)

## 2013-03-25 LAB — RENAL FUNCTION PANEL
Albumin: 3 g/dL — ABNORMAL LOW (ref 3.5–5.2)
BUN: 87 mg/dL — ABNORMAL HIGH (ref 6–23)
CO2: 20 meq/L (ref 19–32)
Calcium: 7.3 mg/dL — ABNORMAL LOW (ref 8.4–10.5)
Chloride: 89 meq/L — ABNORMAL LOW (ref 96–112)
Creatinine, Ser: 11.93 mg/dL — ABNORMAL HIGH (ref 0.50–1.10)
GFR calc Af Amer: 3 mL/min — ABNORMAL LOW
GFR calc non Af Amer: 3 mL/min — ABNORMAL LOW
Glucose, Bld: 99 mg/dL (ref 70–99)
Phosphorus: 6.6 mg/dL — ABNORMAL HIGH (ref 2.3–4.6)
Potassium: 5.2 meq/L — ABNORMAL HIGH (ref 3.5–5.1)
Sodium: 129 meq/L — ABNORMAL LOW (ref 135–145)

## 2013-03-25 LAB — TROPONIN I
Troponin I: 0.5 ng/mL (ref <0.30)
Troponin I: 0.5 ng/mL

## 2013-03-25 MED ORDER — SODIUM CHLORIDE 0.9 % IV SOLN: 100.0000 mL | INTRAVENOUS | Status: DC | PRN

## 2013-03-25 MED ORDER — ALTEPLASE 2 MG IJ SOLR: 2.0000 mg | Freq: Once | INTRAMUSCULAR | Status: DC | PRN

## 2013-03-25 MED ORDER — HYDROXYZINE HCL 25 MG PO TABS
ORAL_TABLET | ORAL | Status: AC
  Filled 2013-03-25: qty 1

## 2013-03-25 MED ORDER — PENTAFLUOROPROP-TETRAFLUOROETH EX AERO: 1.0000 "application " | INHALATION_SPRAY | CUTANEOUS | Status: DC | PRN

## 2013-03-25 MED ORDER — LABETALOL HCL 200 MG PO TABS
200.0000 mg | ORAL_TABLET | Freq: Three times a day (TID) | ORAL | Status: DC
  Administered 2013-03-25 – 2013-03-27 (×5): 200 mg via ORAL
  Filled 2013-03-25 (×8): qty 1

## 2013-03-25 MED ORDER — LIDOCAINE HCL (PF) 1 % IJ SOLN: 5.0000 mL | INTRAMUSCULAR | Status: DC | PRN

## 2013-03-25 MED ORDER — LABETALOL HCL 5 MG/ML IV SOLN
10.0000 mg | Freq: Once | INTRAVENOUS | Status: AC
  Administered 2013-03-25: 10 mg via INTRAVENOUS
  Filled 2013-03-25: qty 4

## 2013-03-25 MED ORDER — LIDOCAINE-PRILOCAINE 2.5-2.5 % EX CREA: 1.0000 "application " | TOPICAL_CREAM | CUTANEOUS | Status: DC | PRN

## 2013-03-25 MED ORDER — HEPARIN SODIUM (PORCINE) 1000 UNIT/ML DIALYSIS: 1000.0000 [IU] | INTRAMUSCULAR | Status: DC | PRN

## 2013-03-25 NOTE — Progress Notes (Signed)
     [facility] Daily Rounding Note 03/25/2013, 12:43 PM  SUBJECTIVE:       Pt in HD this AM so have had to reschedule EGD for 9 AM tomorrow.   Had dark stool with leaching of blood out of stool into commode water this AM. Hungry  OBJECTIVE:         Vital signs in last 24 hours:    Temp:  [97.7 F (36.5 C)-99.5 F (37.5 C)] 97.7 F (36.5 C) (09/15 1051) Pulse Rate:  [44-92] 76 (09/15 1051) Resp:  [18-19] 18 (09/15 0704) BP: (76-234)/(60-106) 195/86 mmHg (09/15 1051) SpO2:  [94 %-98 %] 98 % (09/15 1051) Weight:  [63.1 kg (139 lb 1.8 oz)-65.9 kg (145 lb 4.5 oz)] 63.1 kg (139 lb 1.8 oz) (09/15 1051) Last BM Date: 03/25/13 General: looks well, pleasant, comfortable   Heart: RRR Chest: clear Bil .  Breathing easlily Abdomen: soft, ND, NT, BS active.  Extremities: no CCE Neuro/Psych:  Pleasant, relaxed, no deficits.   Intake/Output from previous day: 09/14 0701 - 09/15 0700 In: 480 [P.O.:480] Out: -   Intake/Output this shift: Total I/O In: -  Out: 2796 [Other:2796]  Lab Results:  Recent Labs  03/24/13 0900 03/25/13 0430 03/25/13 0712  WBC 8.2 8.0 9.1  HGB 9.8* 8.9* 8.7*  HCT 29.2* 26.1* 26.0*  PLT 224 218 229   BMET  Recent Labs  03/25/13 0712  NA 129*  K 5.2*  CL 89*  CO2 20  GLUCOSE 99  BUN 87*  CREATININE 11.93*  CALCIUM 7.3*   LFT  Recent Labs  03/25/13 [id number]  ALBUMIN 3.0*    ASSESMENT: *  Anemia, dark heme + stools in setting of ASA and brilinta, now on hold.  *  ABL anemia.  Got 2 units PRBCs last week   *  ESRD.  Dialyzed this AM *  Hyponatremia  *  Cardiac DES stent 12/2012.  On Brilinta.    PLAN: *  EGD postponed to 9AM tomorrow. CBC in AM.  *  Continue Dexilant (upped to BID last week) ( Allergic to Protonix:rash)   LOS: 4 days   Azucena Freed  03/25/2013, 12:43 PM Pager: [phone]  GI ATTENDING  Patient seen and examined. Interval laboratories reviewed. She reports to me so dark stools. Agree with H&P as above. Plan on  endoscopy/enteroscopy tomorrow. Therapy as needed, can provide know that Brilinta as washed out. I have reviewed this with her.The nature of the procedure, as well as the risks, benefits, and alternatives were carefully and thoroughly reviewed with the patient. Ample time for discussion and questions allowed. The patient understood, was satisfied, and agreed to proceed.  Docia Chuck. Geri Seminole., M.D. [facility] Division of Gastroenterology

## 2013-03-25 NOTE — Progress Notes (Signed)
PATIENT DETAILS Name: Shawna [name] Age: 72 y.o. Sex: female Date of Birth: [birth year] Admit Date: 03/21/2013 Admitting Physician Berle Mull, MD [id number], Lennette Bihari, MD  Subjective: No major issues  Assessment/Plan: Principal Problem:   UGI bleed -seems to have slowed down -EGD rescheduled for tomorrow -on PPI -GI following  Active Problems: Acute blood loss anemia -s/p 2 u PRBC so far this admit -Hb stable for now -monitor H/H closely  Elevated Troponins/ Coronary atherosclerosis of native coronary artery  - likely demand ischemia  -cards following  -off anti-platelet agents  Essential hypertension, benign -uncontrolled -suspect with HD today-the BP will get better -apparently very sensitive to a lot of anti-hypertensive's -c/w Labetalol, Hydralazine-but change labetalol to TID-follow trend and adjust accordingly  Allergic reaction  -to FFP-cont Hydroxyzine (takes at home) and H2 blocker  Dyslipidemia -c/w Statins  Mitral regurgitation- severe  Chronic systolic CHF (congestive heart failure)  Disposition: Remain inpatient  DVT Prophylaxis SCD's  Code Status: Full code   Family Communication None at bedside  Procedures:  None  CONSULTS:  cardiology, GI and nephrology   MEDICATIONS: Scheduled Meds: . ALPRAZolam  0.5 mg Oral TID  . atorvastatin  20 mg Oral q1800  . cinacalcet  30 mg Oral Q breakfast  . darbepoetin (ARANESP) injection - DIALYSIS  100 mcg Intravenous Q Fri-HD  . dexlansoprazole  60 mg Oral BID AC  . fluticasone  2 spray Each Nare Daily  . hydrALAZINE  25 mg Oral BID  . labetalol  200 mg Oral BID  . lanthanum  1,000 mg Oral TID WC  . loratadine  10 mg Oral Daily  . multivitamin  1 tablet Oral QHS  . polyethylene glycol  17 g Oral Daily  . sodium chloride  3 mL Intravenous Q12H  . tobramycin-dexamethasone  1 drop Left Eye QID   Continuous Infusions:  PRN Meds:.acetaminophen, acetaminophen, albuterol,  HYDROcodone-acetaminophen, hydrOXYzine, morphine injection, nitroGLYCERIN, ondansetron, sodium chloride  Antibiotics: Anti-infectives   None       PHYSICAL EXAM: Vital signs in last 24 hours: Filed Vitals:   03/25/13 1015 03/25/13 1020 03/25/13 1029 03/25/13 1051  BP: 76/60 122/91 180/69 195/86  Pulse: 49 92 71 76  Temp:    97.7 F (36.5 C)  TempSrc:    Oral  Resp:      Height:      Weight:    63.1 kg (139 lb 1.8 oz)  SpO2:    98%    Weight change: 8.3 kg (18 lb 4.8 oz) Filed Weights   03/24/13 0357 03/25/13 0640 03/25/13 1051  Weight: 57.6 kg (126 lb 15.8 oz) 65.9 kg (145 lb 4.5 oz) 63.1 kg (139 lb 1.8 oz)   Body mass index is 26.3 kg/(m^2).   Gen Exam: Awake and alert with clear speech.   Neck: Supple, No JVD.   Chest: B/L Clear.   CVS: S1 S2 Regular, no murmurs.  Abdomen: soft, BS +, non tender, non distended.  Extremities: no edema, lower extremities warm to touch. Neurologic: Non Focal.   Skin: No Rash.   Wounds: N/A.   Intake/Output from previous day:  Intake/Output Summary (Last 24 hours) at 03/25/13 1341 Last data filed at 03/25/13 1051  Gross per 24 hour  Intake      0 ml  Output   2796 ml  Net  -2796 ml     LAB RESULTS: CBC  Recent Labs Lab 03/21/13 1214  03/22/13 0500 03/23/13 0444 03/24/13 0900 03/25/13 0430 03/25/13 0712  WBC  8.9  --  14.0* 7.4 8.2 8.0 9.1  HGB 7.4*  < > 10.5* 9.2* 9.8* 8.9* 8.7*  HCT 22.2*  < > 30.0* 27.7* 29.2* 26.1* 26.0*  PLT 215  --  249 206 224 218 229  MCV 92.5  --  87.0 90.2 90.4 89.1 88.7  MCH 30.8  --  30.4 30.0 30.3 30.4 29.7  MCHC 33.3  --  35.0 33.2 33.6 34.1 33.5  RDW 17.2*  --  18.3* 18.5* 17.4* 16.9* 16.8*  LYMPHSABS 0.9  --   --   --   --   --   --   MONOABS 0.3  --   --   --   --   --   --   EOSABS 0.1  --   --   --   --   --   --   BASOSABS 0.0  --   --   --   --   --   --   < > = values in this interval not displayed.  Chemistries   Recent Labs Lab Apr 03, 2013 0455 03/22/13 0500  03/25/13 0712  NA 138 134* 129*  K 4.3 5.2* 5.2*  CL 96 93* 89*  CO2 25 18* 20  GLUCOSE 96 109* 99  BUN 68* 95* 87*  CREATININE 7.43* 10.52* 11.93*  CALCIUM 9.0 9.2 7.3*    CBG: No results found for this basename: GLUCAP,  in the last 168 hours  GFR Estimated Creatinine Clearance: 3.6 ml/min (by C-G formula based on Cr of 11.93).  Coagulation profile No results found for this basename: INR, PROTIME,  in the last 168 hours  Cardiac Enzymes  Recent Labs Lab 03/24/13 2121 03/25/13 0430 03/25/13 0712  TROPONINI 0.57* 0.50* 0.50*    No components found with this basename: POCBNP,  No results found for this basename: DDIMER,  in the last 72 hours No results found for this basename: HGBA1C,  in the last 72 hours No results found for this basename: CHOL, HDL, LDLCALC, TRIG, CHOLHDL, LDLDIRECT,  in the last 72 hours No results found for this basename: TSH, T4TOTAL, FREET3, T3FREE, THYROIDAB,  in the last 72 hours No results found for this basename: VITAMINB12, FOLATE, FERRITIN, TIBC, IRON, RETICCTPCT,  in the last 72 hours No results found for this basename: LIPASE, AMYLASE,  in the last 72 hours  Urine Studies No results found for this basename: UACOL, UAPR, USPG, UPH, UTP, UGL, UKET, UBIL, UHGB, UNIT, UROB, ULEU, UEPI, UWBC, URBC, UBAC, CAST, CRYS, UCOM, BILUA,  in the last 72 hours  MICROBIOLOGY: Recent Results (from the past 240 hour(s))  MRSA PCR SCREENING     Status: None   Collection Time    04-03-13  1:30 PM      Result Value Range Status   MRSA by PCR NEGATIVE  NEGATIVE Final   Comment:            The GeneXpert MRSA Assay (FDA     approved for NASAL specimens     only), is one component of a     comprehensive MRSA colonization     surveillance program. It is not     intended to diagnose MRSA     infection nor to guide or     monitor treatment for     MRSA infections.    RADIOLOGY STUDIES/RESULTS: Dg Abd 2 Views  04-03-2013   *RADIOLOGY REPORT*  Clinical  Data: Abdominal pain.  Diarrhea.  ABDOMEN - 2 VIEW  Comparison: 12/24/2012.  Findings: Radiopaque material throughout the colon most likely related to ingest material with partial filling of the diverticula. No plain film evidence of bowel obstruction or free intraperitoneal air.  Multiple surgical clips within the pelvis greater to the right.  Cardiomegaly post CABG with coronary artery calcification.  Radiopaque structure projects over the upper liver which may be related to ribs although parenchymal calcification not excluded.  IMPRESSION: Colonic diverticula.  No plain film evidence of bowel obstruction or free intraperitoneal air.  Please see above.   Original Report Authenticated By: Genia Del, M.D.    Oren Binet, MD  [facility] Pager:[phone]  If 7PM-7AM, please contact night-coverage [email or web address] Password [email or web address] 03/25/2013, 1:41 PM   LOS: 4 days

## 2013-03-25 NOTE — Progress Notes (Signed)
    SUBJECTIVE: No chest pain this am. One episode of chest pain last night while BP over 200.   BP 168/76  Pulse 75  Temp(Src) 99.5 F (37.5 C) (Oral)  Resp 18  Ht 5\' 1"  (1.549 m)  Wt 145 lb 4.5 oz (65.9 kg)  BMI 27.47 kg/m2  SpO2 94%  Intake/Output Summary (Last 24 hours) at 03/25/13 0802 Last data filed at 03/24/13 1300  Gross per 24 hour  Intake    240 ml  Output      0 ml  Net    240 ml    PHYSICAL EXAM General: Well developed, well nourished, in no acute distress. Alert and oriented x 3.  Psych:  Good affect, responds appropriately Neck: No JVD. No masses noted.  Lungs: Clear bilaterally with no wheezes or rhonci noted.  Heart: RRR with no murmurs noted. Abdomen: Bowel sounds are present. Soft, non-tender.  Extremities: No lower extremity edema.   LABS: Basic Metabolic Panel:  Recent Labs  03/25/13 0712  NA 129*  K 5.2*  CL 89*  CO2 20  GLUCOSE 99  BUN 87*  CREATININE 11.93*  CALCIUM 7.3*  PHOS 6.6*   CBC:  Recent Labs  03/25/13 0430 03/25/13 0712  WBC 8.0 9.1  HGB 8.9* 8.7*  HCT 26.1* 26.0*  MCV 89.1 88.7  PLT 218 229   Cardiac Enzymes:  Recent Labs  03/22/13 1255 03/24/13 2121 03/25/13 0430  TROPONINI 1.31* 0.57* 0.50*    Current Meds: . ALPRAZolam  0.5 mg Oral TID  . atorvastatin  20 mg Oral q1800  . cinacalcet  30 mg Oral Q breakfast  . darbepoetin (ARANESP) injection - DIALYSIS  100 mcg Intravenous Q Fri-HD  . dexlansoprazole  60 mg Oral BID AC  . famotidine  20 mg Oral Daily  . fluticasone  2 spray Each Nare Daily  . hydrALAZINE  25 mg Oral BID  . labetalol  200 mg Oral BID  . lanthanum  1,000 mg Oral TID WC  . loratadine  10 mg Oral Daily  . multivitamin  1 tablet Oral QHS  . polyethylene glycol  17 g Oral Daily  . sodium chloride  3 mL Intravenous Q12H  . tobramycin-dexamethasone  1 drop Left Eye QID   ASSESSMENT AND PLAN:  1. CAD s/p multivessel stenting and CABG: Pt is known to have severe CAD. 4V CABG 06/13/12.  She is admitted with anemia. Elevated troponin likely secondary to demand ischemia in setting of anemia. Troponin trending down. Her ASA and Brilinta are on hold. Continue Lipitor.   2. Anemia: Likely upper GI bleeding. Plans for EGD today.   3. HTN: Per primary team.     Shawna [name],Shawna [name]  9/15/20148:02 AM

## 2013-03-25 NOTE — Procedures (Signed)
Pt seen on HD.  AP 150  Vp 200  SBP 175.  Hg 8.7.   To go for EGD today after HD.  No BRBPR.

## 2013-03-26 ENCOUNTER — Encounter (HOSPITAL_COMMUNITY)
Admission: EM | Disposition: A | Discharge status: Home / Self Care | Payer: PRIVATE HEALTH INSURANCE | Source: Home / Self Care | Attending: Internal Medicine

## 2013-03-26 ENCOUNTER — Encounter (HOSPITAL_COMMUNITY): Payer: Self-pay | Admitting: Gastroenterology

## 2013-03-26 HISTORY — PX: ESOPHAGOGASTRODUODENOSCOPY: SHX5428

## 2013-03-26 LAB — CBC
HCT: 27.9 % — ABNORMAL LOW (ref 36.0–46.0)
Hemoglobin: 9.1 g/dL — ABNORMAL LOW (ref 12.0–15.0)
MCH: 29.8 pg (ref 26.0–34.0)
MCHC: 32.6 g/dL (ref 30.0–36.0)

## 2013-03-26 SURGERY — EGD (ESOPHAGOGASTRODUODENOSCOPY)
Anesthesia: Moderate Sedation

## 2013-03-26 MED ORDER — FENTANYL CITRATE 0.05 MG/ML IJ SOLN
INTRAMUSCULAR | Status: DC | PRN
  Administered 2013-03-26 (×2): 25 ug via INTRAVENOUS

## 2013-03-26 MED ORDER — MIDAZOLAM HCL 5 MG/ML IJ SOLN
INTRAMUSCULAR | Status: AC
  Filled 2013-03-26: qty 2

## 2013-03-26 MED ORDER — FENTANYL CITRATE 0.05 MG/ML IJ SOLN
INTRAMUSCULAR | Status: AC
  Filled 2013-03-26: qty 4

## 2013-03-26 MED ORDER — DOXERCALCIFEROL 4 MCG/2ML IV SOLN
3.0000 ug | INTRAVENOUS | Status: DC
  Administered 2013-03-27: 3 ug via INTRAVENOUS
  Filled 2013-03-26: qty 2

## 2013-03-26 MED ORDER — BUTAMBEN-TETRACAINE-BENZOCAINE 2-2-14 % EX AERO
INHALATION_SPRAY | CUTANEOUS | Status: DC | PRN
  Administered 2013-03-26: 2 via TOPICAL

## 2013-03-26 MED ORDER — LOSARTAN POTASSIUM 50 MG PO TABS
100.0000 mg | ORAL_TABLET | Freq: Every day | ORAL | Status: DC
  Administered 2013-03-26: 100 mg via ORAL
  Filled 2013-03-26 (×2): qty 2

## 2013-03-26 MED ORDER — CLONIDINE HCL 0.1 MG PO TABS
0.1000 mg | ORAL_TABLET | ORAL | Status: DC | PRN
  Filled 2013-03-26: qty 1

## 2013-03-26 MED ORDER — MIDAZOLAM HCL 10 MG/2ML IJ SOLN
INTRAMUSCULAR | Status: DC | PRN
  Administered 2013-03-26 (×4): 2 mg via INTRAVENOUS

## 2013-03-26 NOTE — Progress Notes (Signed)
    SUBJECTIVE: No chest pain or SOB.   BP 166/105  Pulse 75  Temp(Src) 98.4 F (36.9 C) (Oral)  Resp 23  Ht 5\' 1"  (1.549 m)  Wt 138 lb 1.6 oz (62.642 kg)  BMI 26.11 kg/m2  SpO2 94%  Intake/Output Summary (Last 24 hours) at 03/26/13 1252 Last data filed at 03/25/13 2200  Gross per 24 hour  Intake     60 ml  Output      0 ml  Net     60 ml    PHYSICAL EXAM General: Well developed, well nourished, in no acute distress. Alert and oriented x 3.  Psych:  Good affect, responds appropriately Neck: No JVD. No masses noted.  Lungs: Clear bilaterally with no wheezes or rhonci noted.  Heart: RRR with no murmurs noted. Abdomen: Bowel sounds are present. Soft, non-tender.  Extremities: No lower extremity edema.   LABS: Basic Metabolic Panel:  Recent Labs  03/25/13 0712  NA 129*  K 5.2*  CL 89*  CO2 20  GLUCOSE 99  BUN 87*  CREATININE 11.93*  CALCIUM 7.3*  PHOS 6.6*   CBC:  Recent Labs  03/25/13 0712 03/26/13 0546  WBC 9.1 6.7  HGB 8.7* 9.1*  HCT 26.0* 27.9*  MCV 88.7 91.5  PLT 229 239   Cardiac Enzymes:  Recent Labs  03/24/13 2121 03/25/13 0430 03/25/13 0712  TROPONINI 0.57* 0.50* 0.50*   Current Meds: . ALPRAZolam  0.5 mg Oral TID  . atorvastatin  20 mg Oral q1800  . cinacalcet  30 mg Oral Q breakfast  . darbepoetin (ARANESP) injection - DIALYSIS  100 mcg Intravenous Q Fri-HD  . dexlansoprazole  60 mg Oral BID AC  . [START ON 03/27/2013] doxercalciferol  3 mcg Intravenous Q M,W,F-HD  . fluticasone  2 spray Each Nare Daily  . hydrALAZINE  25 mg Oral BID  . labetalol  200 mg Oral TID  . lanthanum  1,000 mg Oral TID WC  . loratadine  10 mg Oral Daily  . losartan  100 mg Oral Daily  . multivitamin  1 tablet Oral QHS  . polyethylene glycol  17 g Oral Daily  . sodium chloride  3 mL Intravenous Q12H  . tobramycin-dexamethasone  1 drop Left Eye QID     ASSESSMENT AND PLAN:  1. CAD/Elevated troponin: Pt is known to have severe CAD. 4V CABG 06/13/12.  She is admitted with anemia. Elevated troponin likely secondary to demand ischemia in setting of anemia. Troponin trend flat. NO chest pain. NO SOB. Her ASA and Brilinta are on hold. Continue Lipitor. Restart ASA 81 mg po Qdaily when ok with GI.   2. 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 She is on a PPI. H/H stable.     Shawna [name]  9/16/201412:52 PM

## 2013-03-26 NOTE — Interval H&P Note (Signed)
History and Physical Interval Note:  03/26/2013 8:56 AM  Shawna [name]  has presented today for surgery, with the diagnosis of gi bleed  The various methods of treatment have been discussed with the patient and family. After consideration of risks, benefits and other options for treatment, the patient has consented to  Procedure(s): ESOPHAGOGASTRODUODENOSCOPY (EGD) (N/A) / PUSH ENTEROSCOPYas a surgical intervention .  The patient's history has been reviewed, patient examined, no change in status, stable for surgery.  I have reviewed the patient's chart and labs.  Questions were answered to the patient's satisfaction.     [name]

## 2013-03-26 NOTE — H&P (View-Only) (Signed)
      Gi Daily Rounding Note 03/25/2013, 12:43 PM  SUBJECTIVE:       Pt in HD this AM so have had to reschedule EGD for 9 AM tomorrow.   Had dark stool with leaching of blood out of stool into commode water this AM. Hungry  OBJECTIVE:         Vital signs in last 24 hours:    Temp:  [97.7 F (36.5 C)-99.5 F (37.5 C)] 97.7 F (36.5 C) (09/15 1051) Pulse Rate:  [44-92] 76 (09/15 1051) Resp:  [18-19] 18 (09/15 0704) BP: (76-234)/(60-106) 195/86 mmHg (09/15 1051) SpO2:  [94 %-98 %] 98 % (09/15 1051) Weight:  [63.1 kg (139 lb 1.8 oz)-65.9 kg (145 lb 4.5 oz)] 63.1 kg (139 lb 1.8 oz) (09/15 1051) Last BM Date: 03/25/13 General: looks well, pleasant, comfortable   Heart: RRR Chest: clear Bil .  Breathing easlily Abdomen: soft, ND, NT, BS active.  Extremities: no CCE Neuro/Psych:  Pleasant, relaxed, no deficits.   Intake/Output from previous day: 09/14 0701 - 09/15 0700 In: 480 [P.O.:480] Out: -   Intake/Output this shift: Total I/O In: -  Out: 2796 [Other:2796]  Lab Results:  Recent Labs  03/24/13 0900 03/25/13 0430 03/25/13 0712  WBC 8.2 8.0 9.1  HGB 9.8* 8.9* 8.7*  HCT 29.2* 26.1* 26.0*  PLT 224 218 229   BMET  Recent Labs  03/25/13 0712  NA 129*  K 5.2*  CL 89*  CO2 20  GLUCOSE 99  BUN 87*  CREATININE 11.93*  CALCIUM 7.3*   LFT  Recent Labs  03/25/13 [id number]  ALBUMIN 3.0*    ASSESMENT: *  Anemia, dark heme + stools in setting of ASA and brilinta, now on hold.  *  ABL anemia.  Got 2 units PRBCs last week   *  ESRD.  Dialyzed this AM *  Hyponatremia  *  Cardiac DES stent 12/2012.  On Brilinta.    PLAN: *  EGD postponed to 9AM tomorrow. CBC in AM.  *  Continue Dexilant (upped to BID last week) ( Allergic to Protonix:rash)   LOS: 4 days   Azucena Freed  03/25/2013, 12:43 PM Pager: [phone]  GI ATTENDING  Patient seen and examined. Interval laboratories reviewed. She reports to me so dark stools. Agree with H&P as above. Plan on  endoscopy/enteroscopy tomorrow. Therapy as needed, can provide know that Brilinta as washed out. I have reviewed this with her.The nature of the procedure, as well as the risks, benefits, and alternatives were carefully and thoroughly reviewed with the patient. Ample time for discussion and questions allowed. The patient understood, was satisfied, and agreed to proceed.  Docia Chuck. Geri Seminole., M.D. [facility] Division of Gastroenterology

## 2013-03-26 NOTE — Progress Notes (Signed)
PATIENT DETAILS Name: Shawna [name] Age: 72 y.o. Sex: female Date of Birth: 04-Sep-1940 Admit Date: 03/21/2013 Admitting Physician Berle Mull, MD [id number], Lennette Bihari, MD  Subjective: One bloody BM this am-s/p EGD today  Assessment/Plan: Principal Problem:   UGI bleed -seems to have slowed down, one BM this am-but Hb remains stable -EGD 9/15-showed Nonbleeding duodenal AVM  -on PPI -GI following  Active Problems: Acute blood loss anemia -s/p 2 u PRBC so far this admit -Hb stable for now -monitor H/H closely  Elevated Troponins/ Coronary atherosclerosis of native coronary artery  - likely demand ischemia  -cards following  -off anti-platelet agents  Essential hypertension, benign -uncontrolled-but better than yesterday-fluctuating -apparently very sensitive to a lot of anti-hypertensive's -c/w Labetalol, Hydralazine-but change labetalol to TID on 9/15-follow trend and adjust accordingly-will give it another 24 hours before changing  Allergic reaction  -to FFP-cont Hydroxyzine (takes at home) and H2 blocker  Dyslipidemia -c/w Statins  Mitral regurgitation- severe  Chronic systolic CHF (congestive heart failure)  Disposition: Remain inpatient  DVT Prophylaxis SCD's  Code Status: Full code   Family Communication None at bedside  Procedures:  None  CONSULTS:  cardiology, GI and nephrology   MEDICATIONS: Scheduled Meds: . ALPRAZolam  0.5 mg Oral TID  . atorvastatin  20 mg Oral q1800  . cinacalcet  30 mg Oral Q breakfast  . darbepoetin (ARANESP) injection - DIALYSIS  100 mcg Intravenous Q Fri-HD  . dexlansoprazole  60 mg Oral BID AC  . [START ON 03/27/2013] doxercalciferol  3 mcg Intravenous Q M,W,F-HD  . fluticasone  2 spray Each Nare Daily  . hydrALAZINE  25 mg Oral BID  . labetalol  200 mg Oral TID  . lanthanum  1,000 mg Oral TID WC  . loratadine  10 mg Oral Daily  . losartan  100 mg Oral Daily  . multivitamin  1 tablet Oral QHS  . polyethylene  glycol  17 g Oral Daily  . sodium chloride  3 mL Intravenous Q12H  . tobramycin-dexamethasone  1 drop Left Eye QID   Continuous Infusions:  PRN Meds:.acetaminophen, acetaminophen, albuterol, cloNIDine, HYDROcodone-acetaminophen, hydrOXYzine, morphine injection, nitroGLYCERIN, ondansetron, sodium chloride  Antibiotics: Anti-infectives   None       PHYSICAL EXAM: Vital signs in last 24 hours: Filed Vitals:   03/26/13 0938 03/26/13 0946 03/26/13 0956 03/26/13 1006  BP: 156/79 147/49 148/50 166/105  Pulse:      Temp:  98.4 F (36.9 C)    TempSrc:  Oral    Resp: 62 19 20 23   Height:      Weight:      SpO2: 100% 94% 94% 94%    Weight change: -2.8 kg (-6 lb 2.8 oz) Filed Weights   03/25/13 0640 03/25/13 1051 03/25/13 2202  Weight: 65.9 kg (145 lb 4.5 oz) 63.1 kg (139 lb 1.8 oz) 62.642 kg (138 lb 1.6 oz)   Body mass index is 26.11 kg/(m^2).   Gen Exam: Awake and alert with clear speech.   Neck: Supple, No JVD.   Chest: B/L Clear.   CVS: S1 S2 Regular, no murmurs.  Abdomen: soft, BS +, non tender, non distended.  Extremities: no edema, lower extremities warm to touch. Neurologic: Non Focal.   Skin: No Rash.   Wounds: N/A.   Intake/Output from previous day:  Intake/Output Summary (Last 24 hours) at 03/26/13 1237 Last data filed at 03/25/13 2200  Gross per 24 hour  Intake     60 ml  Output  0 ml  Net     60 ml     LAB RESULTS: CBC  Recent Labs Lab 03/21/13 1214  03/23/13 0444 03/24/13 0900 03/25/13 0430 03/25/13 0712 03/26/13 0546  WBC 8.9  < > 7.4 8.2 8.0 9.1 6.7  HGB 7.4*  < > 9.2* 9.8* 8.9* 8.7* 9.1*  HCT 22.2*  < > 27.7* 29.2* 26.1* 26.0* 27.9*  PLT 215  < > 206 224 218 229 239  MCV 92.5  < > 90.2 90.4 89.1 88.7 91.5  MCH 30.8  < > 30.0 30.3 30.4 29.7 29.8  MCHC 33.3  < > 33.2 33.6 34.1 33.5 32.6  RDW 17.2*  < > 18.5* 17.4* 16.9* 16.8* 17.1*  LYMPHSABS 0.9  --   --   --   --   --   --   MONOABS 0.3  --   --   --   --   --   --   EOSABS 0.1   --   --   --   --   --   --   BASOSABS 0.0  --   --   --   --   --   --   < > = values in this interval not displayed.  Chemistries   Recent Labs Lab 03/21/13 0455 03/22/13 0500 03/25/13 0712  NA 138 134* 129*  K 4.3 5.2* 5.2*  CL 96 93* 89*  CO2 25 18* 20  GLUCOSE 96 109* 99  BUN 68* 95* 87*  CREATININE 7.43* 10.52* 11.93*  CALCIUM 9.0 9.2 7.3*    CBG: No results found for this basename: GLUCAP,  in the last 168 hours  GFR Estimated Creatinine Clearance: 3.6 ml/min (by C-G formula based on Cr of 11.93).  Coagulation profile No results found for this basename: INR, PROTIME,  in the last 168 hours  Cardiac Enzymes  Recent Labs Lab 03/24/13 2121 03/25/13 0430 03/25/13 0712  TROPONINI 0.57* 0.50* 0.50*    No components found with this basename: POCBNP,  No results found for this basename: DDIMER,  in the last 72 hours No results found for this basename: HGBA1C,  in the last 72 hours No results found for this basename: CHOL, HDL, LDLCALC, TRIG, CHOLHDL, LDLDIRECT,  in the last 72 hours No results found for this basename: TSH, T4TOTAL, FREET3, T3FREE, THYROIDAB,  in the last 72 hours No results found for this basename: VITAMINB12, FOLATE, FERRITIN, TIBC, IRON, RETICCTPCT,  in the last 72 hours No results found for this basename: LIPASE, AMYLASE,  in the last 72 hours  Urine Studies No results found for this basename: UACOL, UAPR, USPG, UPH, UTP, UGL, UKET, UBIL, UHGB, UNIT, UROB, ULEU, UEPI, UWBC, URBC, UBAC, CAST, CRYS, UCOM, BILUA,  in the last 72 hours  MICROBIOLOGY: Recent Results (from the past 240 hour(s))  MRSA PCR SCREENING     Status: None   Collection Time    03/21/13  1:30 PM      Result Value Range Status   MRSA by PCR NEGATIVE  NEGATIVE Final   Comment:            The GeneXpert MRSA Assay (FDA     approved for NASAL specimens     only), is one component of a     comprehensive MRSA colonization     surveillance program. It is not     intended to  diagnose MRSA     infection nor to guide or     monitor  treatment for     MRSA infections.    RADIOLOGY STUDIES/RESULTS: Dg Abd 2 Views  04-19-2013   *RADIOLOGY REPORT*  Clinical Data: Abdominal pain.  Diarrhea.  ABDOMEN - 2 VIEW  Comparison: 12/24/2012.  Findings: Radiopaque material throughout the colon most likely related to ingest material with partial filling of the diverticula. No plain film evidence of bowel obstruction or free intraperitoneal air.  Multiple surgical clips within the pelvis greater to the right.  Cardiomegaly post CABG with coronary artery calcification.  Radiopaque structure projects over the upper liver which may be related to ribs although parenchymal calcification not excluded.  IMPRESSION: Colonic diverticula.  No plain film evidence of bowel obstruction or free intraperitoneal air.  Please see above.   Original Report Authenticated By: Genia Del, M.D.    Oren Binet, MD  [facility] Pager:[phone]  If 7PM-7AM, please contact night-coverage [email or web address] Password [facility] 03/26/2013, 12:37 PM   LOS: 5 days

## 2013-03-26 NOTE — Progress Notes (Signed)
[facility] Progress Note    Assessment/ Plan:   1. Melena/GIB: has history of duodenal AVM's and was on brilanta. Brilanta held. Now on PPI. Continues to have bloody BMs. -plan for EGD today. -continue to follow CBC  2. ESRD HD MWF at [facility]: Had HD yesterday, pressure dropped slightly during HD, now stable. -continue regular schedule HD with tomorrow as next session -continue no heparin with HD, may be able to use following EGD and if GI bleed stops -will monitor renal panel  3. Anemia: slightly improved today. -given aranesp 100 9/12-dose weekly -continue to monitor CBC  4. Metabolic bone disease: -will continue vitamin D, sensipar, and fosrenol  5. HTN: now on hydralazine 25 mg BID and labetolol 200 mg TID. Fluctuated with HD yesterday. Weight stable. -continue current BP meds -follow BP during HD, and amount of fluid taken off during HD  Subjective:   Doing ok. Had bloody BM this morning. Scheduled for EGD today.   Objective:   BP 140/45  Pulse 75  Temp(Src) 98.2 F (36.8 C) (Oral)  Resp 18  Ht 5\' 1"  (1.549 m)  Wt 138 lb 1.6 oz (62.642 kg)  BMI 26.11 kg/m2  SpO2 96%  Intake/Output Summary (Last 24 hours) at 03/26/13 0753 Last data filed at 03/25/13 2200  Gross per 24 hour  Intake     60 ml  Output   2796 ml  Net  -2736 ml   Weight change: -6 lb 2.8 oz (-2.8 kg)  Physical Exam: Gen: NAD, resting comfortably in bed CVS: rrr, no mrg appreciated Resp: CTAB Abd: soft, mild tender to palpation in epigastrium Ext: no LE edema Dialysis access: LUE AVF patent with good bruit  Imaging: No results found.  Labs: BMET  Recent Labs Lab 03/21/13 0455 03/22/13 0500 03/25/13 0712  NA 138 134* 129*  K 4.3 5.2* 5.2*  CL 96 93* 89*  CO2 25 18* 20  GLUCOSE 96 109* 99  BUN 68* 95* 87*  CREATININE 7.43* 10.52* 11.93*  CALCIUM 9.0 9.2 7.3*  PHOS  --   --  6.6*   CBC  Recent Labs Lab 03/21/13 1214  03/24/13 0900 03/25/13 0430  03/25/13 0712 03/26/13 0546  WBC 8.9  < > 8.2 8.0 9.1 6.7  NEUTROABS 7.5  --   --   --   --   --   HGB 7.4*  < > 9.8* 8.9* 8.7* 9.1*  HCT 22.2*  < > 29.2* 26.1* 26.0* 27.9*  MCV 92.5  < > 90.4 89.1 88.7 91.5  PLT 215  < > 224 218 229 239  < > = values in this interval not displayed.  Medications:    . ALPRAZolam  0.5 mg Oral TID  . atorvastatin  20 mg Oral q1800  . cinacalcet  30 mg Oral Q breakfast  . darbepoetin (ARANESP) injection - DIALYSIS  100 mcg Intravenous Q Fri-HD  . dexlansoprazole  60 mg Oral BID AC  . fluticasone  2 spray Each Nare Daily  . hydrALAZINE  25 mg Oral BID  . labetalol  200 mg Oral TID  . lanthanum  1,000 mg Oral TID WC  . loratadine  10 mg Oral Daily  . multivitamin  1 tablet Oral QHS  . polyethylene glycol  17 g Oral Daily  . sodium chloride  3 mL Intravenous Q12H  . tobramycin-dexamethasone  1 drop Left Eye QID      Tommi Rumps, MD 03/26/2013, 7:53 AM  I have seen and  examined this patient and agree with plan per Dr Biagio Quint.  SP endoscopy that showed non bleeding AVM. Hg stable. Plan HD in AM.  Will order hectorol. Evaleen Sant T,MD 03/26/2013 10:33 AM

## 2013-03-26 NOTE — Op Note (Signed)
[facility] [place], [address]   ENDOSCOPY PROCEDURE REPORT  PATIENT: Shawna, [name]  MR#: [id number] BIRTHDATE: 17-Jun-1941 , 71  yrs. old GENDER: Female ENDOSCOPIST: Eustace Quail, MD REFERRED BY:  [facility] PROCEDURE DATE:  03/26/2013 PROCEDURE:  EGD and small bowel enteroscopy w/ control of bleeding (APC) ASA CLASS:     Class III INDICATIONS:  Melena. MEDICATIONS: Fentanyl 75 mcg IV and Versed 8 mg IV TOPICAL ANESTHETIC: Cetacaine Spray  DESCRIPTION OF PROCEDURE: After the risks benefits and alternatives of the procedure were thoroughly explained, informed consent was obtained.  The Pentax Small Bowl Scope 3.8 [id number] endoscope was introduced through the mouth and advanced to the mid jejunum. Without limitations.  The instrument was slowly withdrawn as the mucosa was fully examined.    EXAM:The dedicated enteroscope was to perform examination.  The esophagus revealed a benign ring like large caliber distal esophageal stricture but was otherwise normal.  The stomach revealed nonspecific mild erythema of the antrum but was otherwise normal.  The duodenal bulb revealed 2 nonbleeding AVMs measuring approximately 3 mm.  The descending duodenum, proximal jejunum, and the first segment of the mid jejunum were normal without evidence of active bleeding or AVMs. THERAPY: The end firing 7 [place] APC was used to obliterate the duodenal AVMs.  No bleeding precipitated.  Retroflexed views revealed a hiatal hernia.     The scope was then withdrawn from the patient and the procedure completed.  COMPLICATIONS: There were no complications. ENDOSCOPIC IMPRESSION: 1. Nonbleeding duodenal AVM status post APC 2. Incidental esophageal stricture. Otherwise normal examination to the mid jejunum  RECOMMENDATIONS: 1. Return to hospital. Continue to monitor blood counts and stools. If no bleeding, resume anticoagulation therapy if  medically important. If she were to bleed again on anticoagulation therapy, and anticoagulation therapy will still deemed necessary, then I would recommend a capsule endoscopy while she was on anticoagulation therapy.  REPEAT EXAM:  eSigned:  Eustace Quail, MD 03/26/2013 9:48 AM   [id number] Fuller Plan, MD and The Patient

## 2013-03-27 ENCOUNTER — Encounter (HOSPITAL_COMMUNITY): Payer: Self-pay | Admitting: Internal Medicine

## 2013-03-27 DIAGNOSIS — Z992 Dependence on renal dialysis: Secondary | ICD-10-CM

## 2013-03-27 LAB — CBC
MCH: 30.1 pg (ref 26.0–34.0)
MCV: 91.8 fL (ref 78.0–100.0)
Platelets: 272 10*3/uL (ref 150–400)
RBC: 2.92 MIL/uL — ABNORMAL LOW (ref 3.87–5.11)

## 2013-03-27 LAB — RENAL FUNCTION PANEL
Albumin: 3 g/dL — ABNORMAL LOW (ref 3.5–5.2)
BUN: 51 mg/dL — ABNORMAL HIGH (ref 6–23)
CO2: 23 mEq/L (ref 19–32)
GFR calc Af Amer: 4 mL/min — ABNORMAL LOW (ref >90)
Glucose, Bld: 107 mg/dL — ABNORMAL HIGH (ref 70–99)
Phosphorus: 5.4 mg/dL — ABNORMAL HIGH (ref 2.3–4.6)

## 2013-03-27 MED ORDER — DOXERCALCIFEROL 4 MCG/2ML IV SOLN
INTRAVENOUS | Status: AC
  Administered 2013-03-27: 3 ug via INTRAVENOUS
  Filled 2013-03-27: qty 2

## 2013-03-27 MED ORDER — ASPIRIN 81 MG PO CHEW: 81.0000 mg | CHEWABLE_TABLET | Freq: Every day | ORAL | Status: DC

## 2013-03-27 NOTE — Progress Notes (Signed)
NURSING PROGRESS NOTE  Shawna [name] [id number] Discharge Data: 03/27/2013 4:57 PM Attending Provider: Verlee Monte, MD [id number], Lennette Bihari, MD     [name] to be D/C'd Home per MD order.  Discussed with the patient the After Visit Summary and all questions fully answered. All IV's discontinued with no bleeding noted. All belongings returned to patient for patient to take home.   Last Vital Signs:  Blood pressure 168/79, pulse 91, temperature 98.3 F (36.8 C), temperature source Oral, resp. rate 18, height 5\' 1"  (1.549 m), weight 63.3 kg (139 lb 8.8 oz), SpO2 99.00%.  Discharge Medication List   Medication List    STOP taking these medications       Ticagrelor 90 MG Tabs tablet  Commonly known as:  BRILINTA      TAKE these medications       albuterol 108 (90 BASE) MCG/ACT inhaler  Commonly known as:  PROVENTIL HFA;VENTOLIN HFA  Inhale 2 puffs into the lungs every 6 (six) hours as needed for wheezing or shortness of breath.     ALPRAZolam 0.5 MG tablet  Commonly known as:  XANAX  Take 0.5 mg by mouth 3 (three) times daily.     amLODipine 10 MG tablet  Commonly known as:  NORVASC  Take 10 mg by mouth daily.     aspirin 81 MG chewable tablet  Chew 1 tablet (81 mg total) by mouth daily.     atorvastatin 20 MG tablet  Commonly known as:  LIPITOR  Take 20 mg by mouth daily.     cloNIDine 0.3 MG tablet  Commonly known as:  CATAPRES  Take 0.3 mg by mouth daily.     dexlansoprazole 60 MG capsule  Commonly known as:  DEXILANT  - Take one po 30 minutes prior to breakfast and dinner.  - Lot [id number]     fluticasone 50 MCG/ACT nasal spray  Commonly known as:  FLONASE  Place 2 sprays into the nose daily.     folic acid 1 MG tablet  Commonly known as:  FOLVITE  Take 1 tablet (1 mg total) by mouth daily.     folic acid-vitamin b complex-vitamin c-selenium-zinc 3 MG Tabs tablet  Take 1 tablet by mouth daily.     hydrALAZINE 25 MG tablet  Commonly known as:  APRESOLINE   Take 25 mg by mouth 2 (two) times daily.     hydrOXYzine 25 MG tablet  Commonly known as:  ATARAX/VISTARIL  Take 25 mg by mouth daily as needed for itching.     iron polysaccharides 150 MG capsule  Commonly known as:  NIFEREX  Take 150 mg by mouth daily.     labetalol 200 MG tablet  Commonly known as:  NORMODYNE  Take 200 mg by mouth daily.     lanthanum 1000 MG chewable tablet  Commonly known as:  FOSRENOL  Chew 1,000 mg by mouth 3 (three) times daily after meals.     lidocaine-prilocaine cream  Commonly known as:  EMLA  Apply 1 application topically every Monday, Wednesday, and Friday.     loratadine 10 MG tablet  Commonly known as:  CLARITIN  Take 10 mg by mouth daily.     nitroGLYCERIN 0.4 MG SL tablet  Commonly known as:  NITROSTAT  Place 0.4 mg under the tongue every 5 (five) minutes as needed for chest pain.     polyethylene glycol powder powder  Commonly known as:  GLYCOLAX/MIRALAX  Take 17 g by mouth daily.  SENSIPAR 30 MG tablet  Generic drug:  cinacalcet  Take 30 mg by mouth daily.     tobramycin-dexamethasone ophthalmic solution  Commonly known as:  TOBRADEX  Place 1 drop into the left eye 4 (four) times daily.     traMADol 50 MG tablet  Commonly known as:  ULTRAM  Take 50 mg by mouth every 6 (six) hours as needed for pain.

## 2013-03-27 NOTE — Progress Notes (Signed)
    SUBJECTIVE: feels well. No chest pain or SOB.   BP 155/68  Pulse 80  Temp(Src) 98.5 F (36.9 C) (Oral)  Resp 19  Ht 5\' 1"  (1.549 m)  Wt 142 lb 12.8 oz (64.774 kg)  BMI 27 kg/m2  SpO2 96%  Intake/Output Summary (Last 24 hours) at 03/27/13 0636 Last data filed at 03/26/13 1300  Gross per 24 hour  Intake    240 ml  Output      2 ml  Net    238 ml    PHYSICAL EXAM General: Well developed, well nourished, in no acute distress. Alert and oriented x 3.  Psych:  Good affect, responds appropriately Neck: No JVD. No masses noted.  Lungs: Clear bilaterally with no wheezes or rhonci noted.  Heart: RRR with no murmurs noted. Abdomen: Bowel sounds are present. Soft, non-tender.  Extremities: No lower extremity edema.   LABS: Basic Metabolic Panel:  Recent Labs  03/25/13 0712  NA 129*  K 5.2*  CL 89*  CO2 20  GLUCOSE 99  BUN 87*  CREATININE 11.93*  CALCIUM 7.3*  PHOS 6.6*   CBC:  Recent Labs  03/25/13 0712 03/26/13 0546  WBC 9.1 6.7  HGB 8.7* 9.1*  HCT 26.0* 27.9*  MCV 88.7 91.5  PLT 229 239   Cardiac Enzymes:  Recent Labs  03/24/13 2121 03/25/13 0430 03/25/13 0712  TROPONINI 0.57* 0.50* 0.50*   Current Meds: . ALPRAZolam  0.5 mg Oral TID  . atorvastatin  20 mg Oral q1800  . cinacalcet  30 mg Oral Q breakfast  . darbepoetin (ARANESP) injection - DIALYSIS  100 mcg Intravenous Q Fri-HD  . dexlansoprazole  60 mg Oral BID AC  . doxercalciferol  3 mcg Intravenous Q M,W,F-HD  . fluticasone  2 spray Each Nare Daily  . hydrALAZINE  25 mg Oral BID  . labetalol  200 mg Oral TID  . lanthanum  1,000 mg Oral TID WC  . loratadine  10 mg Oral Daily  . losartan  100 mg Oral Daily  . multivitamin  1 tablet Oral QHS  . polyethylene glycol  17 g Oral Daily  . sodium chloride  3 mL Intravenous Q12H  . tobramycin-dexamethasone  1 drop Left Eye QID     ASSESSMENT AND PLAN:  1. CAD/Elevated troponin: Pt is known to have severe CAD. 4V CABG 06/13/12. Admitted  with anemia likely due to upper GI bleeding. EGD with duodenal AVM. Elevated troponin likely secondary to demand ischemia in setting of anemia. Troponin trend flat. NO chest pain. NO SOB. Her ASA and Brilinta are on hold. Continue Lipitor. Restart ASA 81 mg po Qdaily when ok with GI.  Will sign off. OK for d/c from cardiac standpoint. She can f/u in our [facility] office with Dr. Bronson Ing in 2-3 weeks.   2. Anemia: H/H stable. EGD 03/26/13 with duodenal AVM. She is on a PPI.   3. HTN: Managed by nephrology.     Shawna [name]  9/17/20146:36 AM

## 2013-03-27 NOTE — Progress Notes (Signed)
[facility] Progress Note    Assessment/ Plan:   1. Melena/GIB: has history of duodenal AVM's and was on brilanta. Brilanta held. Now on PPI. Continues to have bloody BMs. -non-bleeding duodenal AVM noted on EGD -continue to follow CBC and bleeding-if continues to bleed they will proceed with capsule endoscopy  2. ESRD HD MWF at [facility]:  -dialysis today -continue no heparin with HD, once confirm stable hgb and no bloody BMs can restart from GI recs -will monitor renal panel with dialysis  3. Anemia: slightly decrease in hgb. Likely related to ESRD and blood loss from GI bleed. -given aranesp 100 9/12-dose weekly -continue to monitor CBC  4. Metabolic bone disease: -will continue vitamin D, sensipar, and fosrenol  5. HTN: now on hydralazine 25 mg BID and labetolol 200 mg TID and losartan 100 mg daily.  -continue current BP meds-consider adding back home amlodipine for improved control -follow BP during HD, and amount of fluid taken off during HD  Subjective:   Doing well. No bloody BM since yesterday morning.   Objective:   BP 155/68  Pulse 80  Temp(Src) 98.5 F (36.9 C) (Oral)  Resp 19  Ht 5\' 1"  (1.549 m)  Wt 142 lb 12.8 oz (64.774 kg)  BMI 27 kg/m2  SpO2 96%  Intake/Output Summary (Last 24 hours) at 03/27/13 0721 Last data filed at 03/26/13 1300  Gross per 24 hour  Intake    240 ml  Output      2 ml  Net    238 ml   Weight change: 3 lb 11 oz (1.674 kg)  Physical Exam: Gen: NAD, resting comfortably in bed in dialysis CVS: rrr, no mrg appreciated Resp: CTAB anteriorly Abd: soft, NT, ND Ext: no LE edema Dialysis access: LUE AVF with dialysis connected  Imaging: EGD with non-bleeding AVM  Labs: BMET  Recent Labs Lab 03/21/13 0455 03/22/13 0500 03/25/13 0712  NA 138 134* 129*  K 4.3 5.2* 5.2*  CL 96 93* 89*  CO2 25 18* 20  GLUCOSE 96 109* 99  BUN 68* 95* 87*  CREATININE 7.43* 10.52* 11.93*  CALCIUM 9.0 9.2 7.3*  PHOS  --   --   6.6*   CBC  Recent Labs Lab 03/21/13 1214  03/25/13 0430 03/25/13 0712 03/26/13 0546 03/27/13 0807  WBC 8.9  < > 8.0 9.1 6.7 6.9  NEUTROABS 7.5  --   --   --   --   --   HGB 7.4*  < > 8.9* 8.7* 9.1* 8.8*  HCT 22.2*  < > 26.1* 26.0* 27.9* 26.8*  MCV 92.5  < > 89.1 88.7 91.5 91.8  PLT 215  < > 218 229 239 272  < > = values in this interval not displayed.  Medications:    . ALPRAZolam  0.5 mg Oral TID  . atorvastatin  20 mg Oral q1800  . cinacalcet  30 mg Oral Q breakfast  . darbepoetin (ARANESP) injection - DIALYSIS  100 mcg Intravenous Q Fri-HD  . dexlansoprazole  60 mg Oral BID AC  . doxercalciferol  3 mcg Intravenous Q M,W,F-HD  . fluticasone  2 spray Each Nare Daily  . hydrALAZINE  25 mg Oral BID  . labetalol  200 mg Oral TID  . lanthanum  1,000 mg Oral TID WC  . loratadine  10 mg Oral Daily  . losartan  100 mg Oral Daily  . multivitamin  1 tablet Oral QHS  . polyethylene glycol  17 g Oral  Daily  . sodium chloride  3 mL Intravenous Q12H  . tobramycin-dexamethasone  1 drop Left Eye QID      Tommi Rumps, MD 03/27/2013, 7:21 AM  I have seen and examined this patient and agree with plan per Dr Biagio Quint.  See procedure note for HD.Marland Kitchen Janique Hoefer T,MD 03/27/2013 1:51 PM

## 2013-03-27 NOTE — Discharge Summary (Signed)
Physician Discharge Summary  Shawna [name] [id number] DOB: 1941/01/24 DOA: 03/21/2013  PCP: [name], MD  Admit date: 03/21/2013 Discharge date: 03/27/2013  Time spent: 40 minutes  Recommendations for Outpatient Follow-up:  1. Follow up with PCP in 1 week.  Discharge Diagnoses:  Principal Problem:   GI bleeding Active Problems:   Coronary atherosclerosis of native coronary artery   Essential hypertension, benign   ESRD on hemodialysis   HLD (hyperlipidemia)   GERD (gastroesophageal reflux disease)   Mitral regurgitation   Abdominal  pain, other specified site   Acute blood loss anemia   Chronic systolic CHF (congestive heart failure)   Discharge Condition: Stable  Diet recommendation: Low potassium renal diet.  Filed Weights   03/25/13 2202 03/26/13 2219 03/27/13 0756  Weight: 62.642 kg (138 lb 1.6 oz) 64.774 kg (142 lb 12.8 oz) 63.3 kg (139 lb 8.8 oz)    History of present illness:  Shawna [name] is a 72 y.o. female with a past medical history of coronary artery disease, status post percutaneous intervention in June and July of 2013, end-stage renal disease on hemodialysis Mondays Wednesdays and Fridays, currently taking Brilinta, presenting to the emergency department overnight with complaints of bloody stools and abdominal pain. She reports developing bloody stools yesterday afternoon and early evening having a total of 4 bloody bowel movements. She is also noticed black tarry stools possibly over the last several days. Patient complains of associated epigastric pain, characterized as sharp, stabbing, nonradiating, intermittent. She also complains of nausea and vomiting, reporting gastric contents and nonbloody. Patient reports associated generalized weakness, dizzy, lightheadedness as well as a subjective fevers and chills. She reports having an episode of hematemesis a while back which she attributes to Celebrex therapy. She has not had syncope or falls at home. One unit  of packed red blood cells have been ordered for transfusion in the emergency department. IV Protonix had been ordered however patient reported a history of Protonix allergy. Initial lab work showing a hemoglobin of 8.2 with a hematocrit of 24.6. She has not had further episodes of bloody stools while in the ED. Presently she is hemodynamically stable.   Hospital Course:   1. Upper GI bleed: Patient had previous history with hematemesis attributed to Celebrex in the past. Presented this time with the bloody stools. Gastroenterology was consulted and the time of admission. Patient is on aspirin and Brilinta. EGD done on 9/15 and showed nonbleeding duodenal AVMs and otherwise normal examination. GI recommended PPI daily and it's okay to restart low-dose aspirin. Recommended to hold Brilinta and restart as needed if no bleeding and it's indicated for her cardiac problems.  2. Acute blood loss anemia: Status post transfusion of 2 units packed RBCs on this admission. Hemoglobin stayed stable afterwards. Check CBC in one week.  3. Elevated troponin: This was secondary to demand ischemia, along with her end stage renal disease. Patient was seen by cardiology and they recommended to restart aspirin if it's okay with gastroenterology. GI okays the use of low dose aspirin, hold Brilinta till she sees her primary cardiologist.  4. Essential hypertension: Uncontrolled blood pressure overall, patient said at home she is controlled with her little and clonidine. Needs further adjustment of her regimen, as outpatient. Likely part of it is a volume overload.  5. Allergic reaction: Patient developed a reaction to transfusion of FFP, was treated with H2 blockers and antihistamines.  6. Mitral valve regurgitation: Patient has severe mitral valve regurgitation, with chronic systolic CHF, her  last LVEF was 43% back in December of 2013. Patient to followup with her primary cardiologist.  7. ESRD: Patient is on  hemodialysis, he may also continued throughout the hospital stay without any problems. Patient did have high blood pressure, patient will followup with primary care physician and her cardiologist as well as her nephrologist, she needs her blood pressure regimen to be adjusted, after making sure her fluid status is normal.  Procedures:  Routine HD.  EGD:  ENDOSCOPIC IMPRESSION:  1. Nonbleeding duodenal AVM status post APC  2. Incidental esophageal stricture. Otherwise normal examination to the mid jejunum   Consultations:  Nephrology.  Cardiology.  GI.  Discharge Exam: Filed Vitals:   03/27/13 1455  BP: 168/79  Pulse: 91  Temp: 98.3 F (36.8 C)  Resp: 18  General: Alert and awake, oriented x3, not in any acute distress. HEENT: anicteric sclera, pupils reactive to light and accommodation, EOMI CVS: S1-S2 clear, no murmur rubs or gallops Chest: clear to auscultation bilaterally, no wheezing, rales or rhonchi Abdomen: soft nontender, nondistended, normal bowel sounds, no organomegaly Extremities: no cyanosis, clubbing or edema noted bilaterally Neuro: Cranial nerves II-XII intact, no focal neurological deficits  Discharge Instructions      Discharge Orders   Future Appointments Provider Department Dept Phone   04/19/2013 1:20 PM Satira Sark, MD [place] (near [place]) [phone]   04/25/2013 10:45 AM Ladene Artist, MD [facility] [phone]   08/08/2013 12:30 PM [facility] [phone]   08/08/2013 1:30 PM [facility] [phone]   08/08/2013 2:00 PM Elam Dutch, MD [facility] [phone]   Future Orders Complete By Expires   Diet - low sodium heart healthy  As directed    Increase activity slowly  As directed        Medication List    STOP taking these medications       Ticagrelor 90 MG Tabs  tablet  Commonly known as:  BRILINTA      TAKE these medications       albuterol 108 (90 BASE) MCG/ACT inhaler  Commonly known as:  PROVENTIL HFA;VENTOLIN HFA  Inhale 2 puffs into the lungs every 6 (six) hours as needed for wheezing or shortness of breath.     ALPRAZolam 0.5 MG tablet  Commonly known as:  XANAX  Take 0.5 mg by mouth 3 (three) times daily.     amLODipine 10 MG tablet  Commonly known as:  NORVASC  Take 10 mg by mouth daily.     aspirin 81 MG chewable tablet  Chew 1 tablet (81 mg total) by mouth daily.     atorvastatin 20 MG tablet  Commonly known as:  LIPITOR  Take 20 mg by mouth daily.     cloNIDine 0.3 MG tablet  Commonly known as:  CATAPRES  Take 0.3 mg by mouth daily.     dexlansoprazole 60 MG capsule  Commonly known as:  DEXILANT  - Take one po 30 minutes prior to breakfast and dinner.  - Lot [id number]     fluticasone 50 MCG/ACT nasal spray  Commonly known as:  FLONASE  Place 2 sprays into the nose daily.     folic acid 1 MG tablet  Commonly known as:  FOLVITE  Take 1 tablet (1 mg total) by mouth daily.     folic acid-vitamin b complex-vitamin c-selenium-zinc 3 MG Tabs tablet  Take 1 tablet  by mouth daily.     hydrALAZINE 25 MG tablet  Commonly known as:  APRESOLINE  Take 25 mg by mouth 2 (two) times daily.     hydrOXYzine 25 MG tablet  Commonly known as:  ATARAX/VISTARIL  Take 25 mg by mouth daily as needed for itching.     iron polysaccharides 150 MG capsule  Commonly known as:  NIFEREX  Take 150 mg by mouth daily.     labetalol 200 MG tablet  Commonly known as:  NORMODYNE  Take 200 mg by mouth daily.     lanthanum 1000 MG chewable tablet  Commonly known as:  FOSRENOL  Chew 1,000 mg by mouth 3 (three) times daily after meals.     lidocaine-prilocaine cream  Commonly known as:  EMLA  Apply 1 application topically every Monday, Wednesday, and Friday.     loratadine 10 MG tablet  Commonly known as:  CLARITIN  Take 10 mg by mouth  daily.     nitroGLYCERIN 0.4 MG SL tablet  Commonly known as:  NITROSTAT  Place 0.4 mg under the tongue every 5 (five) minutes as needed for chest pain.     polyethylene glycol powder powder  Commonly known as:  GLYCOLAX/MIRALAX  Take 17 g by mouth daily.     SENSIPAR 30 MG tablet  Generic drug:  cinacalcet  Take 30 mg by mouth daily.     tobramycin-dexamethasone ophthalmic solution  Commonly known as:  TOBRADEX  Place 1 drop into the left eye 4 (four) times daily.     traMADol 50 MG tablet  Commonly known as:  ULTRAM  Take 50 mg by mouth every 6 (six) hours as needed for pain.       Allergies  Allergen Reactions  . Aspirin Other (See Comments)    Mess up her stomach; "makes my bowels have blood in them". Takes 81 mg EC Aspirin   . Contrast Media [Iodinated Diagnostic Agents] Itching  . Iron Itching and Other (See Comments)    "they gave me iron in dialysis; had to give me Benadryl cause I had to have the iron" (05/02/2012)  . Macrodantin [Nitrofurantoin Macrocrystal] Other (See Comments)    "broke me out in big old knots all over my body; had to go to ER"  . Penicillins Other (See Comments)    "makes me real weak when I take it; like I'll pass out"  . Plavix [Clopidogrel Bisulfate] Rash  . Bactrim [Sulfamethoxazole W-Trimethoprim] Rash  . Sulfa Antibiotics Rash  . Venofer [Ferric Oxide] Itching    Patient reports using Benadryl prior to doses as [facility]  . Protonix [Pantoprazole Sodium] Rash   Follow-up Information   Follow up with [name], MD In 1 week.   Specialty:  Family Medicine   Contact information:   [address] [place] [address] [phone]       Follow up with Herminio Commons, MD In 1 week.   Specialty:  Cardiology   Contact information:   [address] [place] [address] [phone]        The results of significant diagnostics from this hospitalization (including imaging, microbiology, ancillary and laboratory)  are listed below for reference.    Significant Diagnostic Studies: Dg Abd 2 Views  03/21/2013   *RADIOLOGY REPORT*  Clinical Data: Abdominal pain.  Diarrhea.  ABDOMEN - 2 VIEW  Comparison: 12/24/2012.  Findings: Radiopaque material throughout the colon most likely related to ingest material with  partial filling of the diverticula. No plain film evidence of bowel obstruction or free intraperitoneal air.  Multiple surgical clips within the pelvis greater to the right.  Cardiomegaly post CABG with coronary artery calcification.  Radiopaque structure projects over the upper liver which may be related to ribs although parenchymal calcification not excluded.  IMPRESSION: Colonic diverticula.  No plain film evidence of bowel obstruction or free intraperitoneal air.  Please see above.   Original Report Authenticated By: Genia Del, M.D.    Microbiology: Recent Results (from the past 240 hour(s))  MRSA PCR SCREENING     Status: None   Collection Time    03/21/13  1:30 PM      Result Value Range Status   MRSA by PCR NEGATIVE  NEGATIVE Final   Comment:            The GeneXpert MRSA Assay (FDA     approved for NASAL specimens     only), is one component of a     comprehensive MRSA colonization     surveillance program. It is not     intended to diagnose MRSA     infection nor to guide or     monitor treatment for     MRSA infections.     Labs: Basic Metabolic Panel:  Recent Labs Lab 03/21/13 0455 03/22/13 0500 03/25/13 0712 03/27/13 0806  NA 138 134* 129* 134*  K 4.3 5.2* 5.2* 4.6  CL 96 93* 89* 95*  CO2 25 18* 20 23  GLUCOSE 96 109* 99 107*  BUN 68* 95* 87* 51*  CREATININE 7.43* 10.52* 11.93* 10.49*  CALCIUM 9.0 9.2 7.3* 8.1*  PHOS  --   --  6.6* 5.4*   Liver Function Tests:  Recent Labs Lab 03/21/13 0455 03/25/13 0712 03/27/13 0806  AST 14  --   --   ALT 9  --   --   ALKPHOS 86  --   --   BILITOT 0.4  --   --   PROT 6.0  --   --   ALBUMIN 3.2* 3.0* 3.0*   No results  found for this basename: LIPASE, AMYLASE,  in the last 168 hours No results found for this basename: AMMONIA,  in the last 168 hours CBC:  Recent Labs Lab 03/21/13 1214  03/24/13 0900 03/25/13 0430 03/25/13 0712 03/26/13 0546 03/27/13 0807  WBC 8.9  < > 8.2 8.0 9.1 6.7 6.9  NEUTROABS 7.5  --   --   --   --   --   --   HGB 7.4*  < > 9.8* 8.9* 8.7* 9.1* 8.8*  HCT 22.2*  < > 29.2* 26.1* 26.0* 27.9* 26.8*  MCV 92.5  < > 90.4 89.1 88.7 91.5 91.8  PLT 215  < > 224 218 229 239 272  < > = values in this interval not displayed. Cardiac Enzymes:  Recent Labs Lab 03/21/13 2150 03/22/13 1255 03/24/13 2121 03/25/13 0430 03/25/13 0712  TROPONINI 0.33* 1.31* 0.57* 0.50* 0.50*   BNP: BNP (last 3 results)  Recent Labs  09/05/12 2334 12/16/12 1810  PROBNP 5229.0* 8774.0*   CBG: No results found for this basename: GLUCAP,  in the last 168 hours     Signed:  Kalub Morillo A  [facility] 03/27/2013, 4:24 PM

## 2013-03-27 NOTE — Procedures (Signed)
Pt seen on HD.  AP 30 Vp 160.  She is below her DW.  Will try to pull 3 liters and give her a new DW of 60.5.  She should be on no heparin HD for at least another week.

## 2013-03-27 NOTE — Progress Notes (Signed)
     [facility] Daily Rounding Note 03/27/2013, 11:38 AM  SUBJECTIVE:       Minor lower substernal and epigastric pain.  Burns a bit, not worse with solids which she is tolerating. Not worse with palpation.  No SOB.  Stools brown.   OBJECTIVE:         Vital signs in last 24 hours:    Temp:  [97.9 F (36.6 C)-98.5 F (36.9 C)] 98.5 F (36.9 C) (09/17 0440) Pulse Rate:  [73-90] 90 (09/17 1130) Resp:  [19-20] 19 (09/17 0440) BP: (127-183)/(46-93) 127/46 mmHg (09/17 1130) SpO2:  [95 %-96 %] 96 % (09/17 0440) Weight:  [63.3 kg (139 lb 8.8 oz)-64.774 kg (142 lb 12.8 oz)] 63.3 kg (139 lb 8.8 oz) (09/17 0756) Last BM Date: 03/26/13 General: looks well, seen during dialysis session   Heart: RRR Chest: clear bil in front.  Breathing easily Abdomen: soft, NT, ND.  No mass.  Active BS  Extremities: no CCE Neuro/Psych:  Pleasant, alert, relaxed.  No confusion.   Intake/Output from previous day: 09/16 0701 - 09/17 0700 In: 240 [P.O.:240] Out: 2 [Urine:2]  Intake/Output this shift:    Lab Results:  Recent Labs  03/25/13 0712 03/26/13 0546 03/27/13 0807  WBC 9.1 6.7 6.9  HGB 8.7* 9.1* 8.8*  HCT 26.0* 27.9* 26.8*  PLT 229 239 272   BMET  Recent Labs  03/25/13 0712 03/27/13 0806  NA 129* 134*  K 5.2* 4.6  CL 89* 95*  CO2 20 23  GLUCOSE 99 107*  BUN 87* 51*  CREATININE 11.93* 10.49*  CALCIUM 7.3* 8.1*   LFT  Recent Labs  03/25/13 0712 03/27/13 0806  ALBUMIN 3.0* 3.0*    ASSESMENT: * Anemia, dark heme + stools in setting of ASA and brilinta, now on hold.  EGD 9/16:  APC ablation of non-bleeding duodenal AVMs.  * ABL anemia. Got 2 units PRBCs last week.  Hgb stable.  * ESRD. Dialyzed MWF * Hyponatremia  * Cardiac DES stent 12/2012. Brilinta on hold, pt's understanding is that it is not going to be restarted.  However Dr Reatha Armour note does not state this.    PLAN: *  Ok to restart low dose ASA.  Per Dr Blanch Media post op note: " Continue to monitor blood counts  and stools.  If no bleeding, resume anticoagulation therapy if medically  important. If she were to bleed again on anticoagulation therapy,  and anticoagulation therapy will still deemed necessary, then I  would recommend a capsule endoscopy while she was on  anticoagulation therapy".    LOS: 6 days   Azucena Freed  03/27/2013, 11:38 AM Pager: [phone]  GI ATTENDING  Interval history and laboratories reviewed. Patient personally seen and examined. Case discussed with her nephrologist, Dr. Mercy Moore. Patient's husband in room. Complain she "doesn't feel well. Nothing specific. No GI bleeding. Stools brown. Hemoglobin stable. I reviewed the procedural details from yesterday's enteroscopy. My recommendations as outlined yesterday and again above. GI is available as needed. Will sign off.Docia Chuck. Geri Seminole., M.D. [facility] of Gastroenterology

## 2013-03-27 NOTE — Care Management Note (Signed)
    Page 1 of 1   03/27/2013     3:00:35 PM   CARE MANAGEMENT NOTE 03/27/2013  Patient:  Shawna [name], Shawna [name]   Account Number:  [id number]  Date Initiated:  03/22/2013  Documentation initiated by:  Marvetta Gibbons  Subjective/Objective Assessment:   Pt admitted with GIB/anemia     Action/Plan:   PTA pt lived at home with spouse- hx HD- NCM to follow for d/c needs   Anticipated DC Date:  03/25/2013   Anticipated DC Plan:  [place]  CM consult      Choice offered to / List presented to:             Status of service:  Completed, signed off Medicare Important Message given?   (If response is "NO", the following Medicare IM given date fields will be blank) Date Medicare IM given:   Date Additional Medicare IM given:    Discharge Disposition:  HOME/SELF CARE  Per UR Regulation:  Reviewed for med. necessity/level of care/duration of stay  If discussed at [place] of Stay Meetings, dates discussed:    Comments:  03/27/13 14:58 Tomi Bamberger RN,BSN [id number] patient lives with spouse, pta indep. Patient states she has medication coverage , a pcp and she has transportation. No needs anticipated.

## 2013-03-29 ENCOUNTER — Telehealth: Payer: Self-pay | Admitting: Nurse Practitioner

## 2013-03-29 NOTE — Telephone Encounter (Signed)
Patient wants to know if she needs to continue Dexilant BID. Please, advise

## 2013-04-02 ENCOUNTER — Other Ambulatory Visit: Payer: Self-pay | Admitting: *Deleted

## 2013-04-02 ENCOUNTER — Telehealth: Payer: Self-pay | Admitting: *Deleted

## 2013-04-02 MED ORDER — DEXLANSOPRAZOLE 60 MG PO CPDR: DELAYED_RELEASE_CAPSULE | ORAL | Status: DC

## 2013-04-02 NOTE — Telephone Encounter (Signed)
Patient states she cannot afford Dexilant. She has had a reaction to Protonix in past(rash). Please, advise.

## 2013-04-02 NOTE — Telephone Encounter (Signed)
Left a message for patient to call me. 

## 2013-04-02 NOTE — Telephone Encounter (Signed)
She can decrease to once daily 30 minutes before breakfast. Also, if less expensive she can change to any other PPI. She already has a follow up scheduled. Thanks

## 2013-04-02 NOTE — Telephone Encounter (Signed)
Patient given recommendation. Rx sent to pharmacy.

## 2013-04-03 MED ORDER — DEXLANSOPRAZOLE 60 MG PO CPDR: DELAYED_RELEASE_CAPSULE | ORAL | Status: DC

## 2013-04-03 NOTE — Telephone Encounter (Signed)
Per Tye Savoy, NP, keep patient on Dexilant until OV. Give samples to patient. Samples up front for pick up. Left a message for patient to call me.

## 2013-04-04 ENCOUNTER — Encounter: Payer: Self-pay | Admitting: Cardiovascular Disease

## 2013-04-04 ENCOUNTER — Ambulatory Visit (INDEPENDENT_AMBULATORY_CARE_PROVIDER_SITE_OTHER): Payer: Medicare Other | Admitting: Cardiovascular Disease

## 2013-04-04 VITALS — BP 131/63 | HR 72 | Ht 61.0 in | Wt 138.8 lb

## 2013-04-04 DIAGNOSIS — I255 Ischemic cardiomyopathy: Secondary | ICD-10-CM

## 2013-04-04 DIAGNOSIS — I059 Rheumatic mitral valve disease, unspecified: Secondary | ICD-10-CM

## 2013-04-04 DIAGNOSIS — Z951 Presence of aortocoronary bypass graft: Secondary | ICD-10-CM

## 2013-04-04 DIAGNOSIS — I10 Essential (primary) hypertension: Secondary | ICD-10-CM

## 2013-04-04 DIAGNOSIS — Z79899 Other long term (current) drug therapy: Secondary | ICD-10-CM

## 2013-04-04 DIAGNOSIS — I2589 Other forms of chronic ischemic heart disease: Secondary | ICD-10-CM

## 2013-04-04 DIAGNOSIS — I2510 Atherosclerotic heart disease of native coronary artery without angina pectoris: Secondary | ICD-10-CM

## 2013-04-04 DIAGNOSIS — I340 Nonrheumatic mitral (valve) insufficiency: Secondary | ICD-10-CM

## 2013-04-04 DIAGNOSIS — R079 Chest pain, unspecified: Secondary | ICD-10-CM

## 2013-04-04 DIAGNOSIS — E785 Hyperlipidemia, unspecified: Secondary | ICD-10-CM

## 2013-04-04 NOTE — Telephone Encounter (Signed)
Spoke with patient and she will pick up samples.

## 2013-04-04 NOTE — Telephone Encounter (Signed)
Left a message for patient to call me. 

## 2013-04-04 NOTE — Progress Notes (Signed)
Patient ID: Shawna [name], female   DOB: Dec 01, 1940, 72 y.o.   MRN: [id number]   SUBJECTIVE: Shawna [name] is here today for post-hospitalization follow-up. I saw in consultation at [facility] for a mildly elevated troponin in the setting of profound anemia, for which she received multiple PRBC transfusions. She had an EGD which revealed nonbleeding duodenal AVM's. She had been on ASA 81 mg daily and Brilinta, both of which were held. At the time of discharge, GI felt it would be permissible to restart ASA.  Today, she denies any bleeding (hematochezia, melena) and also denies shortness of breath. She's had a chronic left-sided chest pain since her CABG on 06/13/12, deemed to be musculoskeletal in etiology.  At that time, she had a LIMA-LAD, SVG-RCA, SVG-ramus intermedius, SVG-LCx. Prior to that, she had undergone stenting in June and July of 2013, but in December 2013, she had presented with a NSTEMI and was found to have restenosis of the stents to the RCA and LCx, and significant LAD disease.  Post-operative TEE found her LV systolic function to be mildly reduced, EF 40-45%, with severe inferior wall hypokinesis. She was also noted to have severe mitral regurgitation with systolic pulmonary vein flow reversal, without annular dilatation, and deemed to be ischemic in etiology.  She had discontinued the Lipitor she had been taking on her own, as she heard it may cause memory problems. However, since stopping the Brilinta, she has restarted the Lipitor and continues with EC ASA 81 mg daily.  She also has bilateral carotid artery stenosis, found to be <40% in 01/2013.  She has ESRD with hemodialysis on M,W, and F.  SocHx: has 2 daughters, one in [place] and one in [place]. Has twin granddaughters and one grandson in [place].    Allergies  Allergen Reactions  . Aspirin Other (See Comments)    Mess up her stomach; "makes my bowels have blood in them". Takes 81 mg EC Aspirin   . Contrast Media [Iodinated [employer]] Itching  . Iron Itching and Other (See Comments)    "they gave me iron in dialysis; had to give me Benadryl cause I had to have the iron" (05/02/2012)  . Macrodantin [Nitrofurantoin Macrocrystal] Other (See Comments)    "broke me out in big old knots all over my body; had to go to ER"  . Penicillins Other (See Comments)    "makes me real weak when I take it; like I'll pass out"  . Plavix [Clopidogrel Bisulfate] Rash  . Bactrim [Sulfamethoxazole W-Trimethoprim] Rash  . Sulfa Antibiotics Rash  . Venofer [Ferric Oxide] Itching    Patient reports using Benadryl prior to doses as [facility]  . Protonix [Pantoprazole Sodium] Rash    Current Outpatient Prescriptions  Medication Sig Dispense Refill  . albuterol (PROVENTIL HFA;VENTOLIN HFA) 108 (90 BASE) MCG/ACT inhaler Inhale 2 puffs into the lungs every 6 (six) hours as needed for wheezing or shortness of breath.       . ALPRAZolam (XANAX) 0.5 MG tablet Take 0.5 mg by mouth 3 (three) times daily.      Marland Kitchen amLODipine (NORVASC) 10 MG tablet Take 10 mg by mouth daily.      Marland Kitchen aspirin 81 MG chewable tablet Chew 1 tablet (81 mg total) by mouth daily.      Marland Kitchen atorvastatin (LIPITOR) 20 MG tablet Take 20 mg by mouth daily.      . cloNIDine (CATAPRES) 0.3 MG tablet Take 0.3 mg by mouth daily.      Marland Kitchen  dexlansoprazole (DEXILANT) 60 MG capsule Take one po 30 minutes prior to breakfast  30 capsule  0  . fluticasone (FLONASE) 50 MCG/ACT nasal spray Place 2 sprays into the nose daily.      . folic acid (FOLVITE) 1 MG tablet Take 1 tablet (1 mg total) by mouth daily.  30 tablet  6  . folic acid-vitamin b complex-vitamin c-selenium-zinc (DIALYVITE) 3 MG TABS Take 1 tablet by mouth daily.      . hydrALAZINE (APRESOLINE) 25 MG tablet Take 25 mg by mouth 2 (two) times daily.      . hydrOXYzine (ATARAX/VISTARIL) 25 MG tablet Take 25 mg by mouth daily as needed for itching.       . iron polysaccharides (NIFEREX) 150 MG capsule Take 150 mg by mouth daily.       Marland Kitchen labetalol (NORMODYNE) 200 MG tablet Take 200 mg by mouth daily.      Marland Kitchen lanthanum (FOSRENOL) 1000 MG chewable tablet Chew 1,000 mg by mouth 3 (three) times daily after meals.      . lidocaine-prilocaine (EMLA) cream Apply 1 application topically every Monday, Wednesday, and Friday.       . loratadine (CLARITIN) 10 MG tablet Take 10 mg by mouth daily.       . nitroGLYCERIN (NITROSTAT) 0.4 MG SL tablet Place 0.4 mg under the tongue every 5 (five) minutes as needed for chest pain.      . polyethylene glycol powder (GLYCOLAX/MIRALAX) powder Take 17 g by mouth daily.  255 g  1  . SENSIPAR 30 MG tablet Take 30 mg by mouth daily.       Marland Kitchen tobramycin-dexamethasone (TOBRADEX) ophthalmic solution Place 1 drop into the left eye 4 (four) times daily.       . traMADol (ULTRAM) 50 MG tablet Take 50 mg by mouth every 6 (six) hours as needed for pain.        No current facility-administered medications for this visit.    Past Medical History  Diagnosis Date  . ESRD on hemodialysis     ESRD due to HTN, started dialysis 2011 and gets HD at [facility] with Dr Hinda Lenis on MWF schedule.  Access is LUA AVF as of Sept 2014.   Marland Kitchen Chronic bronchitis   . GERD (gastroesophageal reflux disease)   . PUD (peptic ulcer disease)   . History of lower GI bleeding   . Arthritis   . History of gout   . CAD (coronary artery disease)     a. 12/2011 NSTEMI/Cath/PCI LCX (2.25x14 Resolute DES) & D1 (2.25x22 Resolute DES);  b. 01/2012 Cath/PCI: LM 30, LAD 30p, 40-46m, D1 stent ok, 99 in sm branch of diag, LCX patent stent, OM1 20, RCA 95 ost (4.0x12 Promus DES), EF 55%;  c. 04/2012 Lexi Cardiolite  EF 48%, small area of scar @ base/mid inflat wall with mild peri-infarct ischemia.; CABG 12/4  . High cholesterol 12/2011  . Pneumonia ~ 2009  . Iron deficiency anemia   . TIA (transient ischemic attack)   . Anxiety   . History of blood transfusion 07/2011; 12/2011; 01/2012 X 2; 04/2012  . Carotid artery disease     a. [id number] LICA,  [id number]   . Mitral regurgitation     a. Moderate by echo, 02/2012  . Myocardial infarction   . Ovarian cancer 1992  . Colon cancer 1992  . Chronic diastolic CHF (congestive heart failure)     a. 02/2012 Echo EF 60-65%, nl wall motion, Gr 1 DD,  mod MR  . Hypertension   . AVM (arteriovenous malformation) of colon   . Esophageal stricture     Past Surgical History  Procedure Laterality Date  . Abdominal hysterectomy  1992  . Appendectomy  06/1990  . Tubal ligation  1980's  . Av fistula placement  07/2009    left upper arm  . Thrombectomy / arteriovenous graft revision  2011    left upper arm  . Colon resection  1992  . Esophagogastroduodenoscopy  01/20/2012    Procedure: ESOPHAGOGASTRODUODENOSCOPY (EGD);  Surgeon: Ladene Artist, MD,FACG;  Location: [facility] ENDOSCOPY;  Service: Endoscopy;  Laterality: N/A;  . Dilation and curettage of uterus    . Coronary angioplasty with stent placement  12/15/11    "2"  . Coronary angioplasty with stent placement  y/2013    "1; makes total of 3" (05/02/2012)  . Coronary artery bypass graft  06/13/2012    Procedure: CORONARY ARTERY BYPASS GRAFTING (CABG);  Surgeon: Grace Isaac, MD;  Location: [facility];  Service: Open Heart Surgery;  Laterality: N/A;  cabg x four;  using left internal mammary artery, and left leg greater saphenous vein harvested endoscopically  . Intraoperative transesophageal echocardiogram  06/13/2012    Procedure: INTRAOPERATIVE TRANSESOPHAGEAL ECHOCARDIOGRAM;  Surgeon: Grace Isaac, MD;  Location: [facility];  Service: Open Heart Surgery;  Laterality: N/A;  . Colectomy  1992  . Esophagogastroduodenoscopy N/A 03/26/2013    Procedure: ESOPHAGOGASTRODUODENOSCOPY (EGD);  Surgeon: Irene Shipper, MD;  Location: [facility] ENDOSCOPY;  Service: Endoscopy;  Laterality: N/A;    History   Social History  . Marital Status: Married    Spouse Name: N/A    Number of Children: N/A  . Years of Education: N/A   Occupational History  . Not on file.    Social History Main Topics  . Smoking status: Never Smoker   . Smokeless tobacco: Never Used  . Alcohol Use: No  . Drug Use: No  . Sexual Activity: Yes   Other Topics Concern  . Not on file   Social History Narrative   Lives in [place], [place] with husband.  Dialysis pt - mwf.     Filed Vitals:   04/04/13 1353  BP: 131/63  Pulse: 72  Height: 5\' 1"  (1.549 m)  Weight: 138 lb 12 oz (62.937 kg)    PHYSICAL EXAM General: NAD Neck: No JVD, no thyromegaly or thyroid nodule.  Lungs: Clear to auscultation bilaterally with normal respiratory effort. CV: Nondisplaced PMI.  Heart regular S1/S2, no S3/S4, no murmur.  No peripheral edema.  No carotid bruit.  Normal pedal pulses.  Abdomen: Soft, nontender, no hepatosplenomegaly, no distention.  Neurologic: Alert and oriented x 3.  Psych: Normal affect. Extremities: No clubbing or cyanosis.   ECG: reviewed and available in electronic records.      ASSESSMENT AND PLAN: 1. CAD s/p CABG: she is symptomatically stable. I will continue EC ASA 81 mg daily along with Lipitor. She is already on labetalol as she's noted to have recalcitrant hypertension. 2. HTN: controlled on amlodipine, clonidine, hydralazine, and labetalol. 3. Hyperlipidemia: continue Lipitor. 4. Mitral regurgitation: I did not appreciate any murmur on exam, and it's quite possible that after CABG, this has been attenuated and she may also have improved LV systolic function. I will repeat a transthoracic echocardiogram for further evaluation. She appears to be asymptomatic from this standpoint, without symptoms of heart failure or angina. 5. Carotid artery stenosis: continue surveillance monitoring.   Kate Sable, M.D., F.A.C.C.

## 2013-04-04 NOTE — Patient Instructions (Addendum)
Your physician recommends that you schedule a follow-up appointment in: 3 months with Dr Bronson Ing   Your physician has requested that you have an echocardiogram. Echocardiography is a painless test that uses sound waves to create images of your heart. It provides your doctor with information about the size and shape of your heart and how well your heart's chambers and valves are working. This procedure takes approximately one hour. There are no restrictions for this procedure.   Your physician has recommended you make the following change in your medication:  1. Stop Brilinta

## 2013-04-08 ENCOUNTER — Ambulatory Visit: Payer: Medicare Other | Admitting: Gastroenterology

## 2013-04-10 ENCOUNTER — Other Ambulatory Visit: Payer: Self-pay | Admitting: Cardiology

## 2013-04-11 ENCOUNTER — Ambulatory Visit (HOSPITAL_COMMUNITY)
Admission: RE | Admit: 2013-04-11 | Discharge: 2013-04-11 | Disposition: A | Discharge status: Home / Self Care | Payer: Medicare Other | Source: Ambulatory Visit | Attending: Cardiovascular Disease | Admitting: Cardiovascular Disease

## 2013-04-11 DIAGNOSIS — Z992 Dependence on renal dialysis: Secondary | ICD-10-CM | POA: Insufficient documentation

## 2013-04-11 DIAGNOSIS — I340 Nonrheumatic mitral (valve) insufficiency: Secondary | ICD-10-CM

## 2013-04-11 DIAGNOSIS — I059 Rheumatic mitral valve disease, unspecified: Secondary | ICD-10-CM | POA: Insufficient documentation

## 2013-04-11 DIAGNOSIS — N186 End stage renal disease: Secondary | ICD-10-CM | POA: Insufficient documentation

## 2013-04-11 DIAGNOSIS — I509 Heart failure, unspecified: Secondary | ICD-10-CM | POA: Insufficient documentation

## 2013-04-11 NOTE — Progress Notes (Signed)
*  PRELIMINARY RESULTS* Echocardiogram 2D Echocardiogram has been performed.  [place], [place] 04/11/2013, 2:49 PM

## 2013-04-19 ENCOUNTER — Ambulatory Visit: Payer: Medicare Other | Admitting: Cardiology

## 2013-04-23 ENCOUNTER — Telehealth: Payer: Self-pay | Admitting: *Deleted

## 2013-04-23 NOTE — Telephone Encounter (Signed)
Patient called with c/o pain in back between shoulder blades.  No c/o chest pain, SOB, or dizziness.  Stated she did go ahead & take 1 NTG tab, but did not change pain much.  Stated that she just started feeling symptoms this morning when she got up and started moving around.  States she does feel a little bit more tired today than usual though.  Advised in the meantime she could try taking Tylenol as this would be safe with her other medications.  Will send message to Dr. Bronson Ing for advice, but he may advise seeing PMD also in light of recent stable Echo.  Patient verbalized understanding

## 2013-04-23 NOTE — Telephone Encounter (Signed)
Would recommend seeing PCP for this.

## 2013-04-24 NOTE — Telephone Encounter (Signed)
Advised pt to contact PCP, pt understood and will call PCP today

## 2013-04-25 ENCOUNTER — Encounter: Payer: Self-pay | Admitting: Gastroenterology

## 2013-04-25 ENCOUNTER — Ambulatory Visit (INDEPENDENT_AMBULATORY_CARE_PROVIDER_SITE_OTHER): Payer: Medicare Other | Admitting: Gastroenterology

## 2013-04-25 VITALS — BP 158/82 | HR 100 | Ht 61.0 in | Wt 139.6 lb

## 2013-04-25 DIAGNOSIS — K31811 Angiodysplasia of stomach and duodenum with bleeding: Secondary | ICD-10-CM

## 2013-04-25 DIAGNOSIS — D62 Acute posthemorrhagic anemia: Secondary | ICD-10-CM

## 2013-04-25 MED ORDER — OMEPRAZOLE 40 MG PO CPDR: 40.0000 mg | DELAYED_RELEASE_CAPSULE | Freq: Every day | ORAL | Status: DC

## 2013-04-25 NOTE — Patient Instructions (Signed)
Please stop your Dexilant.   We have sent the following medications to your pharmacy for you to pick up at your convenience: omeprazole.   Please continue ongoing care and follow up with your Primary Care Physician.  Thank you for choosing me and [facility].  Pricilla Riffle. Dagoberto Ligas., MD., Marval Regal

## 2013-04-25 NOTE — Progress Notes (Signed)
    History of Present Illness: This is a 72 year old female who was hospitalized for gastrointestinal bleeding. I reviewed the hospitalization and discharge summary. She underwent enteroscopy with treatment of 2 duodenal AVMs by Dr. [name]. She has felt well since discharge with no gastrointestinal complaints and no gastrointestinal bleeding. She feels she is having more cramps in her hands since beginning [place]. She states she had a rash to pantoprazole. Brilinta was discontinued.  Current Medications, Allergies, Past Medical History, Past Surgical History, Family History and Social History were reviewed in [employer] record.  Physical Exam: General: Well developed , well nourished, no acute distress Head: Normocephalic and atraumatic Eyes:  sclerae anicteric, EOMI Ears: Normal auditory acuity Mouth: No deformity or lesions Lungs: Clear throughout to auscultation Heart: Regular rate and rhythm; no murmurs, rubs or bruits Abdomen: Soft, non tender and non distended. No masses, hepatosplenomegaly or hernias noted. Normal Bowel sounds Musculoskeletal: Symmetrical with no gross deformities  Pulses:  Normal pulses noted Extremities: No clubbing, cyanosis, edema or deformities noted Neurological: Alert oriented x 4, grossly nonfocal Psychological:  Alert and cooperative. Normal mood and affect  Assessment and Recommendations:  1. Duodenal AVMs treated with APC at enteroscopy. Brilinta was discontinued and she is now maintained on aspirin. Change to omeprazole 40 mg daily given complaints that she relates to [place]. GI followup when necessary.  2. ABL anemia. Discharge hemoglobin=8.8. Followup with her PCP.

## 2013-05-28 ENCOUNTER — Encounter (HOSPITAL_COMMUNITY): Payer: Self-pay | Admitting: Emergency Medicine

## 2013-05-28 ENCOUNTER — Inpatient Hospital Stay (HOSPITAL_COMMUNITY)
Admission: EM | Admit: 2013-05-28 | Discharge: 2013-06-01 | DRG: 388 | Disposition: A | Discharge status: Home / Self Care | Payer: PRIVATE HEALTH INSURANCE | Attending: Internal Medicine | Admitting: Internal Medicine

## 2013-05-28 DIAGNOSIS — K56600 Partial intestinal obstruction, unspecified as to cause: Secondary | ICD-10-CM

## 2013-05-28 DIAGNOSIS — E860 Dehydration: Secondary | ICD-10-CM | POA: Diagnosis not present

## 2013-05-28 DIAGNOSIS — I509 Heart failure, unspecified: Secondary | ICD-10-CM | POA: Diagnosis present

## 2013-05-28 DIAGNOSIS — I5032 Chronic diastolic (congestive) heart failure: Secondary | ICD-10-CM | POA: Diagnosis present

## 2013-05-28 DIAGNOSIS — Z951 Presence of aortocoronary bypass graft: Secondary | ICD-10-CM

## 2013-05-28 DIAGNOSIS — T465X5A Adverse effect of other antihypertensive drugs, initial encounter: Secondary | ICD-10-CM | POA: Diagnosis not present

## 2013-05-28 DIAGNOSIS — I2510 Atherosclerotic heart disease of native coronary artery without angina pectoris: Secondary | ICD-10-CM | POA: Diagnosis present

## 2013-05-28 DIAGNOSIS — R000 Tachycardia, unspecified: Secondary | ICD-10-CM | POA: Diagnosis not present

## 2013-05-28 DIAGNOSIS — Z9861 Coronary angioplasty status: Secondary | ICD-10-CM

## 2013-05-28 DIAGNOSIS — I5022 Chronic systolic (congestive) heart failure: Secondary | ICD-10-CM

## 2013-05-28 DIAGNOSIS — E785 Hyperlipidemia, unspecified: Secondary | ICD-10-CM | POA: Diagnosis present

## 2013-05-28 DIAGNOSIS — D509 Iron deficiency anemia, unspecified: Secondary | ICD-10-CM | POA: Diagnosis present

## 2013-05-28 DIAGNOSIS — I059 Rheumatic mitral valve disease, unspecified: Secondary | ICD-10-CM | POA: Diagnosis present

## 2013-05-28 DIAGNOSIS — H04309 Unspecified dacryocystitis of unspecified lacrimal passage: Secondary | ICD-10-CM | POA: Diagnosis present

## 2013-05-28 DIAGNOSIS — K565 Intestinal adhesions [bands], unspecified as to partial versus complete obstruction: Principal | ICD-10-CM | POA: Diagnosis present

## 2013-05-28 DIAGNOSIS — Z8673 Personal history of transient ischemic attack (TIA), and cerebral infarction without residual deficits: Secondary | ICD-10-CM

## 2013-05-28 DIAGNOSIS — Z992 Dependence on renal dialysis: Secondary | ICD-10-CM

## 2013-05-28 DIAGNOSIS — Y9210 Unspecified residential institution as the place of occurrence of the external cause: Secondary | ICD-10-CM | POA: Diagnosis not present

## 2013-05-28 DIAGNOSIS — M129 Arthropathy, unspecified: Secondary | ICD-10-CM | POA: Diagnosis present

## 2013-05-28 DIAGNOSIS — I959 Hypotension, unspecified: Secondary | ICD-10-CM | POA: Diagnosis not present

## 2013-05-28 DIAGNOSIS — N2581 Secondary hyperparathyroidism of renal origin: Secondary | ICD-10-CM | POA: Diagnosis present

## 2013-05-28 DIAGNOSIS — Z8543 Personal history of malignant neoplasm of ovary: Secondary | ICD-10-CM

## 2013-05-28 DIAGNOSIS — I252 Old myocardial infarction: Secondary | ICD-10-CM

## 2013-05-28 DIAGNOSIS — E782 Mixed hyperlipidemia: Secondary | ICD-10-CM | POA: Diagnosis present

## 2013-05-28 DIAGNOSIS — E7800 Pure hypercholesterolemia, unspecified: Secondary | ICD-10-CM | POA: Diagnosis present

## 2013-05-28 DIAGNOSIS — I120 Hypertensive chronic kidney disease with stage 5 chronic kidney disease or end stage renal disease: Secondary | ICD-10-CM | POA: Diagnosis present

## 2013-05-28 DIAGNOSIS — K219 Gastro-esophageal reflux disease without esophagitis: Secondary | ICD-10-CM | POA: Diagnosis present

## 2013-05-28 DIAGNOSIS — N186 End stage renal disease: Secondary | ICD-10-CM | POA: Diagnosis present

## 2013-05-28 DIAGNOSIS — D631 Anemia in chronic kidney disease: Secondary | ICD-10-CM | POA: Diagnosis present

## 2013-05-28 DIAGNOSIS — Z7982 Long term (current) use of aspirin: Secondary | ICD-10-CM

## 2013-05-28 DIAGNOSIS — I10 Essential (primary) hypertension: Secondary | ICD-10-CM | POA: Diagnosis present

## 2013-05-28 DIAGNOSIS — F411 Generalized anxiety disorder: Secondary | ICD-10-CM | POA: Diagnosis present

## 2013-05-28 LAB — CBC WITH DIFFERENTIAL/PLATELET
Eosinophils Absolute: 0.4 10*3/uL (ref 0.0–0.7)
HCT: 38.7 % (ref 36.0–46.0)
Hemoglobin: 13.1 g/dL (ref 12.0–15.0)
Lymphocytes Relative: 6 % — ABNORMAL LOW (ref 12–46)
Monocytes Absolute: 0.7 10*3/uL (ref 0.1–1.0)
Monocytes Relative: 6 % (ref 3–12)
Neutro Abs: 10.1 10*3/uL — ABNORMAL HIGH (ref 1.7–7.7)
Platelets: 289 10*3/uL (ref 150–400)
WBC: 12.1 10*3/uL — ABNORMAL HIGH (ref 4.0–10.5)

## 2013-05-28 LAB — COMPREHENSIVE METABOLIC PANEL
AST: 31 U/L (ref 0–37)
BUN: 56 mg/dL — ABNORMAL HIGH (ref 6–23)
CO2: 25 mEq/L (ref 19–32)
Calcium: 9.5 mg/dL (ref 8.4–10.5)
Chloride: 93 mEq/L — ABNORMAL LOW (ref 96–112)
Creatinine, Ser: 10.08 mg/dL — ABNORMAL HIGH (ref 0.50–1.10)
GFR calc Af Amer: 4 mL/min — ABNORMAL LOW (ref >90)
GFR calc non Af Amer: 3 mL/min — ABNORMAL LOW (ref >90)
Total Bilirubin: 0.4 mg/dL (ref 0.3–1.2)
Total Protein: 8.1 g/dL (ref 6.0–8.3)

## 2013-05-28 LAB — LIPASE, BLOOD: Lipase: 47 U/L (ref 11–59)

## 2013-05-28 LAB — POCT I-STAT TROPONIN I: Troponin i, poc: 0.03 ng/mL (ref 0.00–0.08)

## 2013-05-28 MED ORDER — HYDROMORPHONE HCL PF 2 MG/ML IJ SOLN
2.0000 mg | Freq: Once | INTRAMUSCULAR | Status: AC
  Administered 2013-05-28: 2 mg via INTRAMUSCULAR
  Filled 2013-05-28: qty 1

## 2013-05-28 MED ORDER — ONDANSETRON 4 MG PO TBDP
8.0000 mg | ORAL_TABLET | Freq: Once | ORAL | Status: DC
  Filled 2013-05-28: qty 2

## 2013-05-28 MED ORDER — ONDANSETRON HCL 4 MG/2ML IJ SOLN: 4.0000 mg | Freq: Once | INTRAMUSCULAR | Status: DC

## 2013-05-28 MED ORDER — ONDANSETRON HCL 4 MG/2ML IJ SOLN
4.0000 mg | Freq: Once | INTRAMUSCULAR | Status: AC
  Administered 2013-05-28: 4 mg via INTRAVENOUS
  Filled 2013-05-28: qty 2

## 2013-05-28 MED ORDER — HYDROMORPHONE HCL PF 1 MG/ML IJ SOLN: 1.0000 mg | Freq: Once | INTRAMUSCULAR | Status: DC

## 2013-05-28 NOTE — ED Provider Notes (Addendum)
CSN: [id number]     Arrival date & time 05/28/13  2144 History   First MD Initiated Contact with Patient 05/28/13 2248     Chief Complaint  Patient presents with  . Abdominal Pain   (Consider location/radiation/quality/duration/timing/severity/associated sxs/prior Treatment) Patient is a 72 y.o. female presenting with abdominal pain. The history is provided by the patient.  Abdominal Pain She had onset at about 7 PM of severe epigastric pain with radiation to the back. There is associated nausea and vomiting. She had temporary relief of pain following emesis. She is unable to characterize the pain but rates it at 10/10. Nothing makes it better nothing makes it worse. Is not had similar pain before. She denies fever, chills, sweats. She is a hemodialysis patient and is due for dialysis tomorrow. She states that she does not make much urine at all.  Past Medical History  Diagnosis Date  . ESRD on hemodialysis     ESRD due to HTN, started dialysis 2011 and gets HD at [facility] with Dr Hinda Lenis on MWF schedule.  Access is LUA AVF as of Sept 2014.   Marland Kitchen Chronic bronchitis   . GERD (gastroesophageal reflux disease)   . PUD (peptic ulcer disease)   . History of lower GI bleeding   . Arthritis   . History of gout   . CAD (coronary artery disease)     a. 12/2011 NSTEMI/Cath/PCI LCX (2.25x14 Resolute DES) & D1 (2.25x22 Resolute DES);  b. 01/2012 Cath/PCI: LM 30, LAD 30p, 40-84m, D1 stent ok, 99 in sm branch of diag, LCX patent stent, OM1 20, RCA 95 ost (4.0x12 Promus DES), EF 55%;  c. 04/2012 Lexi Cardiolite  EF 48%, small area of scar @ base/mid inflat wall with mild peri-infarct ischemia.; CABG 12/4  . High cholesterol 12/2011  . Pneumonia ~ 2009  . Iron deficiency anemia   . TIA (transient ischemic attack)   . Anxiety   . History of blood transfusion 07/2011; 12/2011; 01/2012 X 2; 04/2012  . Carotid artery disease     a. [id number] LICA, [id number]   . Mitral regurgitation     a. Moderate by echo,  02/2012  . Myocardial infarction   . Ovarian cancer 1992  . Colon cancer 1992  . Chronic diastolic CHF (congestive heart failure)     a. 02/2012 Echo EF 60-65%, nl wall motion, Gr 1 DD, mod MR  . Hypertension   . AVM (arteriovenous malformation) of colon   . Esophageal stricture    Past Surgical History  Procedure Laterality Date  . Abdominal hysterectomy  1992  . Appendectomy  06/1990  . Tubal ligation  1980's  . Av fistula placement  07/2009    left upper arm  . Thrombectomy / arteriovenous graft revision  2011    left upper arm  . Colon resection  1992  . Esophagogastroduodenoscopy  01/20/2012    Procedure: ESOPHAGOGASTRODUODENOSCOPY (EGD);  Surgeon: Ladene Artist, MD,FACG;  Location: [facility] ENDOSCOPY;  Service: Endoscopy;  Laterality: N/A;  . Dilation and curettage of uterus    . Coronary angioplasty with stent placement  12/15/11    "2"  . Coronary angioplasty with stent placement  y/2013    "1; makes total of 3" (05/02/2012)  . Coronary artery bypass graft  06/13/2012    Procedure: CORONARY ARTERY BYPASS GRAFTING (CABG);  Surgeon: Grace Isaac, MD;  Location: [place];  Service: Open Heart Surgery;  Laterality: N/A;  cabg x four;  using left internal mammary artery,  and left leg greater saphenous vein harvested endoscopically  . Intraoperative transesophageal echocardiogram  06/13/2012    Procedure: INTRAOPERATIVE TRANSESOPHAGEAL ECHOCARDIOGRAM;  Surgeon: Grace Isaac, MD;  Location: [place];  Service: Open Heart Surgery;  Laterality: N/A;  . Colectomy  1992  . Esophagogastroduodenoscopy N/A 03/26/2013    Procedure: ESOPHAGOGASTRODUODENOSCOPY (EGD);  Surgeon: Irene Shipper, MD;  Location: [facility] ENDOSCOPY;  Service: Endoscopy;  Laterality: N/A;  . Stomach surgery     Family History  Problem Relation Age of Onset  . Other      noncontributory for early CAD  . Heart disease Mother     Heart Disease before age 29  . Hyperlipidemia Mother   . Hypertension Mother   . Heart disease  Father   . Hyperlipidemia Father   . Hypertension Father    History  Substance Use Topics  . Smoking status: Never Smoker   . Smokeless tobacco: Never Used  . Alcohol Use: No   OB History   Grav Para Term Preterm Abortions TAB SAB Ect Mult Living                 Review of Systems  Gastrointestinal: Positive for abdominal pain.  All other systems reviewed and are negative.    Allergies  Aspirin; Contrast media; Iron; Macrodantin; Penicillins; Plavix; Bactrim; Sulfa antibiotics; Venofer; and Protonix  Home Medications   Current Outpatient Rx  Name  Route  Sig  Dispense  Refill  . albuterol (PROVENTIL HFA;VENTOLIN HFA) 108 (90 BASE) MCG/ACT inhaler   Inhalation   Inhale 2 puffs into the lungs every 6 (six) hours as needed for wheezing or shortness of breath.          . ALPRAZolam (XANAX) 0.5 MG tablet   Oral   Take 0.5 mg by mouth 3 (three) times daily.         Marland Kitchen amLODipine (NORVASC) 10 MG tablet   Oral   Take 10 mg by mouth daily.         Marland Kitchen aspirin 81 MG chewable tablet   Oral   Chew 1 tablet (81 mg total) by mouth daily.         . cloNIDine (CATAPRES) 0.3 MG tablet   Oral   Take 0.3 mg by mouth daily.         . fluticasone (FLONASE) 50 MCG/ACT nasal spray   Nasal   Place 2 sprays into the nose daily.         . folic acid (FOLVITE) 1 MG tablet   Oral   Take 1 tablet (1 mg total) by mouth daily.   30 tablet   6   . folic acid-vitamin b complex-vitamin c-selenium-zinc (DIALYVITE) 3 MG TABS   Oral   Take 1 tablet by mouth daily.         . hydrALAZINE (APRESOLINE) 25 MG tablet   Oral   Take 25 mg by mouth 2 (two) times daily.         . hydrOXYzine (ATARAX/VISTARIL) 25 MG tablet   Oral   Take 25 mg by mouth daily as needed for itching.          . iron polysaccharides (NIFEREX) 150 MG capsule   Oral   Take 150 mg by mouth daily.         Marland Kitchen labetalol (NORMODYNE) 200 MG tablet   Oral   Take 200 mg by mouth daily.         Marland Kitchen  lanthanum (FOSRENOL) 1000  MG chewable tablet   Oral   Chew 1,000 mg by mouth 3 (three) times daily after meals.         . lidocaine-prilocaine (EMLA) cream   Topical   Apply 1 application topically every Monday, Wednesday, and Friday.          Marland Kitchen LIPITOR 20 MG tablet      TAKE 1 BY MOUTH DAILY AT   6PM *CHRISTOPHER BERGE,NP*   30 tablet   2   . loratadine (CLARITIN) 10 MG tablet   Oral   Take 10 mg by mouth daily.          . nitroGLYCERIN (NITROSTAT) 0.4 MG SL tablet   Sublingual   Place 0.4 mg under the tongue every 5 (five) minutes as needed for chest pain.         Marland Kitchen omeprazole (PRILOSEC) 40 MG capsule   Oral   Take 1 capsule (40 mg total) by mouth daily.   30 capsule   11   . polyethylene glycol powder (GLYCOLAX/MIRALAX) powder   Oral   Take 17 g by mouth daily.   255 g   1   . SENSIPAR 30 MG tablet   Oral   Take 30 mg by mouth daily.          Marland Kitchen tobramycin-dexamethasone (TOBRADEX) ophthalmic solution   Left Eye   Place 1 drop into the left eye 4 (four) times daily.          . traMADol (ULTRAM) 50 MG tablet   Oral   Take 50 mg by mouth every 6 (six) hours as needed for pain.           BP 187/82  Pulse 89  Temp(Src) 98.8 F (37.1 C)  Resp 16  Wt 142 lb 1.6 oz (64.456 kg)  SpO2 99% Physical Exam  Nursing note and vitals reviewed.  72 year old female, who appears uncomfortable, but is in no acute distress. Vital signs are significant for hypertension with blood pressure 187/82. Oxygen saturation is 99%, which is normal. Head is normocephalic and atraumatic. PERRLA, EOMI. Oropharynx is clear. Neck is nontender and supple without adenopathy or JVD. Back is nontender and there is no CVA tenderness. Lungs are clear without rales, wheezes, or rhonchi. Chest is nontender. Heart has regular rate and rhythm without murmur. Abdomen is soft, flat, with moderate tenderness diffusely. Tenderness is worse in the epigastrium and right upper quadrant.  There is negative Murphy sign. There is no rebound or guarding. There are no masses or hepatosplenomegaly and peristalsis is  hypoactive. Extremities have no cyanosis or edema, full range of motion is present. AV fistula is present in the left upper arm with thrill present. Skin is warm and dry without rash. Neurologic: Mental status is normal, cranial nerves are intact, there are no motor or sensory deficits.  ED Course  Procedures (including critical care time) Labs Review Results for orders placed during the hospital encounter of 05/28/13  CBC WITH DIFFERENTIAL      Result Value Range   WBC 12.1 (*) 4.0 - 10.5 K/uL   RBC 4.04  3.87 - 5.11 MIL/uL   Hemoglobin 13.1  12.0 - 15.0 g/dL   HCT 38.7  36.0 - 46.0 %   MCV 95.8  78.0 - 100.0 fL   MCH 32.4  26.0 - 34.0 pg   MCHC 33.9  30.0 - 36.0 g/dL   RDW 17.8 (*) 11.5 - 15.5 %   Platelets 289  150 -  400 K/uL   Neutrophils Relative % 84 (*) 43 - 77 %   Neutro Abs 10.1 (*) 1.7 - 7.7 K/uL   Lymphocytes Relative 6 (*) 12 - 46 %   Lymphs Abs 0.8  0.7 - 4.0 K/uL   Monocytes Relative 6  3 - 12 %   Monocytes Absolute 0.7  0.1 - 1.0 K/uL   Eosinophils Relative 4  0 - 5 %   Eosinophils Absolute 0.4  0.0 - 0.7 K/uL   Basophils Relative 1  0 - 1 %   Basophils Absolute 0.1  0.0 - 0.1 K/uL  COMPREHENSIVE METABOLIC PANEL      Result Value Range   Sodium 139  135 - 145 mEq/L   Potassium 4.8  3.5 - 5.1 mEq/L   Chloride 93 (*) 96 - 112 mEq/L   CO2 25  19 - 32 mEq/L   Glucose, Bld 143 (*) 70 - 99 mg/dL   BUN 56 (*) 6 - 23 mg/dL   Creatinine, Ser 10.08 (*) 0.50 - 1.10 mg/dL   Calcium 9.5  8.4 - 10.5 mg/dL   Total Protein 8.1  6.0 - 8.3 g/dL   Albumin 4.3  3.5 - 5.2 g/dL   AST 31  0 - 37 U/L   ALT 15  0 - 35 U/L   Alkaline Phosphatase 126 (*) 39 - 117 U/L   Total Bilirubin 0.4  0.3 - 1.2 mg/dL   GFR calc non Af Amer 3 (*) >90 mL/min   GFR calc Af Amer 4 (*) >90 mL/min  LIPASE, BLOOD      Result Value Range   Lipase 47  11 - 59 U/L  POCT  I-STAT TROPONIN I      Result Value Range   Troponin i, poc 0.03  0.00 - 0.08 ng/mL   Comment 3            EKG Interpretation    Date/Time:  Tuesday May 28 2013 22:00:08 EST Ventricular Rate:  85 PR Interval:  140 QRS Duration: 90 QT Interval:  416 QTC Calculation: 495 R Axis:   52 Text Interpretation:  Normal sinus rhythm Left ventricular hypertrophy with repolarization abnormality Anterior infarct , age undetermined Abnormal ECG When compared with ECG of 03/24/2013, No significant change was found Confirmed by [facility]  MD, Marita Burnsed ([id number]) on 05/28/2013 10:50:40 PM            MDM   1. Partial small bowel obstruction   2. End-stage renal disease on hemodialysis    Upper abdominal pain of uncertain cause. She is noted to be hypertensive. She has had previous abdominal surgery of hysterectomy and appendectomy. Screening labs of been ordered. She is given hydromorphone and ondansetron for symptomatic relief.  She got good relief of pain and nausea with the above-noted treatment. She also vomited a large amount of just following my exam but prior to her getting the medication. She developed itching from hydromorphone was given diphenhydramine with relief of itching. Abdominal exam was repeated showing only mild tenderness in the periumbilical area without rebound or guarding. Abdominal workup was significant only for mild leukocytosis. She does not show evidence of serious intra-abdominal pathology at this time and is discharged. She's given a prescription for ondansetron for nausea. She is to go for her dialysis later today as scheduled and return to the ED if symptoms are worsening.  Delora Fuel, MD [id number]  4:12 AM Patient had been put up for discharge but started having increasing  abdominal pain. She was observed in the ED and pain did not improve. Exam was repeated and she shows mild to moderate tenderness which is worse in the epigastric area. She'll be given an additional  dose of morphine and diphenhydramine, and will be sent for CT of abdomen and pelvis.  8:14 AM CT shows evidence of partial small bowel obstruction which would account for her symptoms. Case is discussed with Dr. Nicoletta Dress of internal medicine teaching service who agrees to admit the patient.   Delora Fuel, MD [id number]

## 2013-05-28 NOTE — ED Provider Notes (Signed)
Patient not in room on attempted evaluation x2.  Ezequiel Essex, MD 05/28/13 [phone]

## 2013-05-28 NOTE — ED Notes (Signed)
Pt. reports mid / epigastric pain onset today with nausea and vomitting . Denies diarrhea /fever or chills. Hemodialysis q Mon/Wed/Fri.

## 2013-05-29 ENCOUNTER — Observation Stay (HOSPITAL_COMMUNITY): Payer: PRIVATE HEALTH INSURANCE

## 2013-05-29 ENCOUNTER — Emergency Department (HOSPITAL_COMMUNITY): Payer: PRIVATE HEALTH INSURANCE

## 2013-05-29 DIAGNOSIS — D631 Anemia in chronic kidney disease: Secondary | ICD-10-CM

## 2013-05-29 DIAGNOSIS — E785 Hyperlipidemia, unspecified: Secondary | ICD-10-CM

## 2013-05-29 DIAGNOSIS — K56609 Unspecified intestinal obstruction, unspecified as to partial versus complete obstruction: Secondary | ICD-10-CM

## 2013-05-29 DIAGNOSIS — Z992 Dependence on renal dialysis: Secondary | ICD-10-CM

## 2013-05-29 DIAGNOSIS — I120 Hypertensive chronic kidney disease with stage 5 chronic kidney disease or end stage renal disease: Secondary | ICD-10-CM

## 2013-05-29 DIAGNOSIS — K219 Gastro-esophageal reflux disease without esophagitis: Secondary | ICD-10-CM

## 2013-05-29 DIAGNOSIS — N186 End stage renal disease: Secondary | ICD-10-CM

## 2013-05-29 DIAGNOSIS — H04309 Unspecified dacryocystitis of unspecified lacrimal passage: Secondary | ICD-10-CM | POA: Diagnosis present

## 2013-05-29 LAB — IRON AND TIBC
Saturation Ratios: 7 % — ABNORMAL LOW (ref 20–55)
UIBC: 180 ug/dL (ref 125–400)

## 2013-05-29 LAB — COMPREHENSIVE METABOLIC PANEL
Albumin: 3.2 g/dL — ABNORMAL LOW (ref 3.5–5.2)
Alkaline Phosphatase: 98 U/L (ref 39–117)
BUN: 66 mg/dL — ABNORMAL HIGH (ref 6–23)
Creatinine, Ser: 11.75 mg/dL — ABNORMAL HIGH (ref 0.50–1.10)
GFR calc Af Amer: 3 mL/min — ABNORMAL LOW (ref >90)
GFR calc non Af Amer: 3 mL/min — ABNORMAL LOW (ref >90)
Glucose, Bld: 99 mg/dL (ref 70–99)
Sodium: 141 mEq/L (ref 135–145)
Total Bilirubin: 0.4 mg/dL (ref 0.3–1.2)

## 2013-05-29 LAB — HEPATITIS B SURFACE ANTIGEN: Hepatitis B Surface Ag: NEGATIVE

## 2013-05-29 LAB — MAGNESIUM: Magnesium: 1.7 mg/dL (ref 1.5–2.5)

## 2013-05-29 LAB — PHOSPHORUS: Phosphorus: 6 mg/dL — ABNORMAL HIGH (ref 2.3–4.6)

## 2013-05-29 LAB — MRSA PCR SCREENING: MRSA by PCR: NEGATIVE

## 2013-05-29 IMAGING — DX DG CHEST 1V PORT
1 series · 1 of 1 positions shown · non-contrast
Comparison: [DATE].

CLINICAL DATA: Cough, abdominal pain

EXAM:
PORTABLE CHEST - 1 VIEW

[portable]
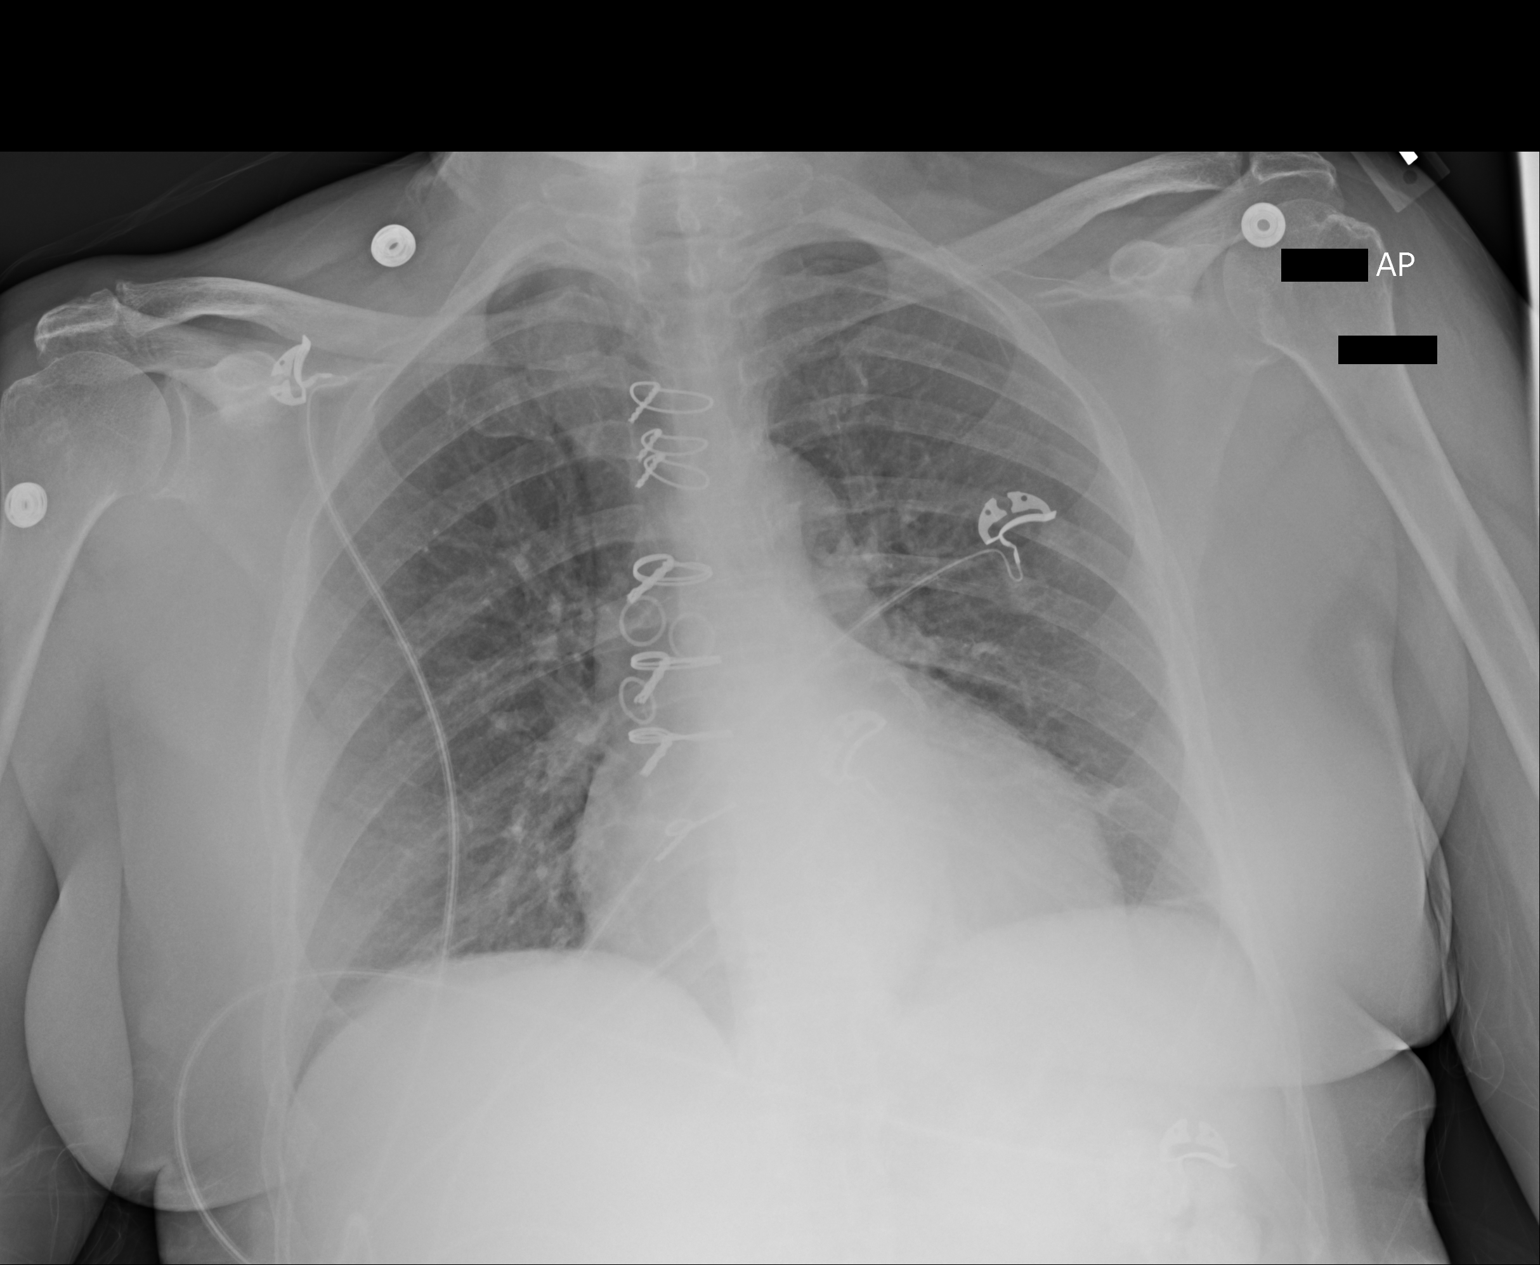

[1 of 1 positions shown; findings below may reference images not displayed]

FINDINGS: The heart size and mediastinal contours are within normal limits.
There is evidence of prior CABG. There is left basilar scarring. The
lungs are otherwise clear. No pleural effusion or pneumothorax.
There are degenerative changes of bilateral acromioclavicular
joints.
IMPRESSION: No active disease.

## 2013-05-29 IMAGING — CT CT ABD-PELV W/O CM
2 of 4 series · 17 of 46 positions shown, 19 images · non-contrast
Comparison: CT [DATE]

CLINICAL DATA: Abdominal pain. Nausea and vomiting. Chronic renal
failure

EXAM:
CT ABDOMEN AND PELVIS WITHOUT CONTRAST
TECHNIQUE: Multidetector CT imaging of the abdomen and pelvis was performed
following the standard protocol without intravenous contrast.

[Series 2: abd/ pelvis 5.0 i30f 1 · axial · 0.65mm/px · z∈[+809,+1169]mm · 14 of 79 slices shown, 16 images]
[im 4/79  soft-tissue]
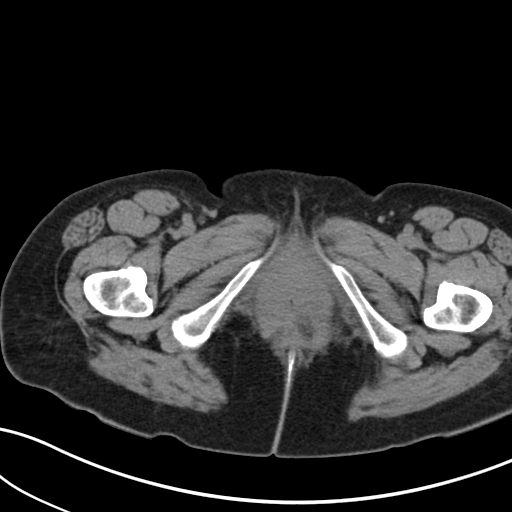
[im 4/79  bone]
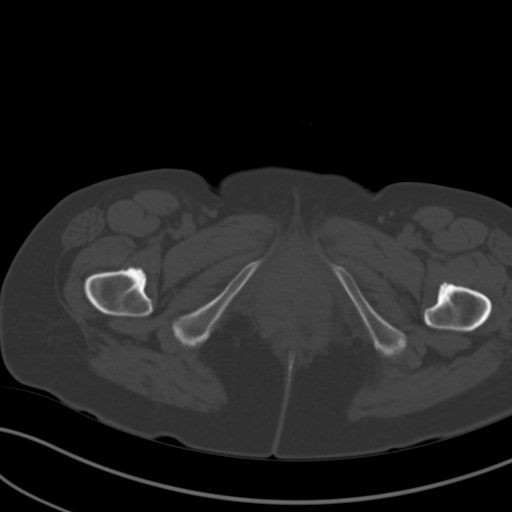
[im 10/79  soft-tissue]
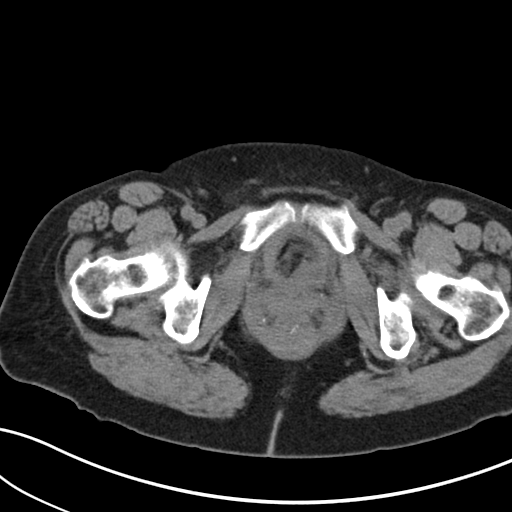
[im 16/79  soft-tissue]
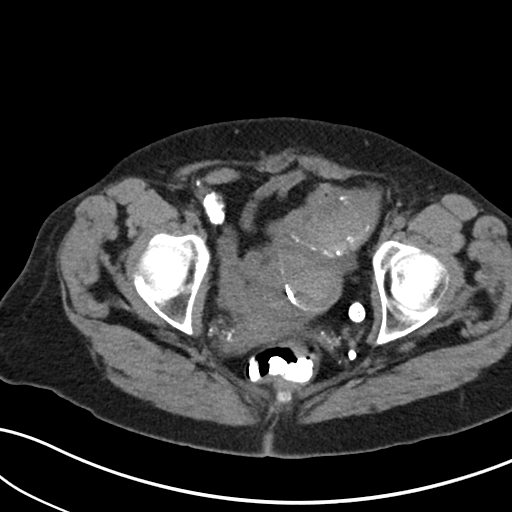
[im 22/79  soft-tissue]
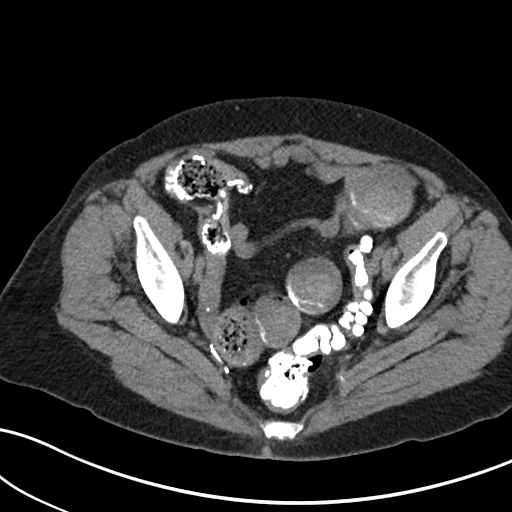
[im 28/79  soft-tissue]
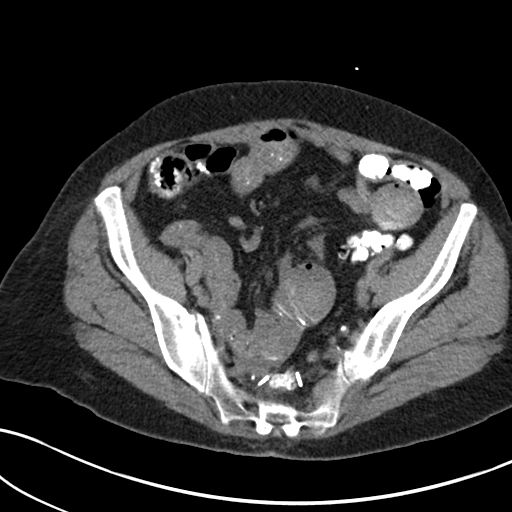
[im 31/79  soft-tissue]
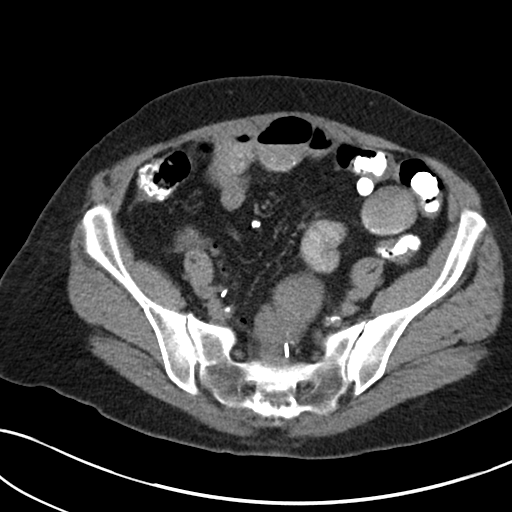
[im 37/79  soft-tissue]
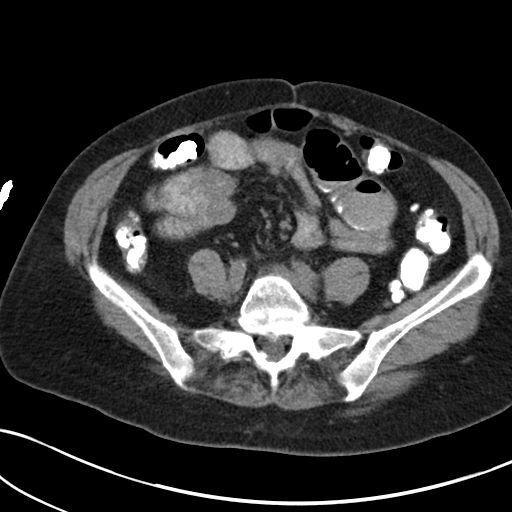
[im 43/79  soft-tissue]
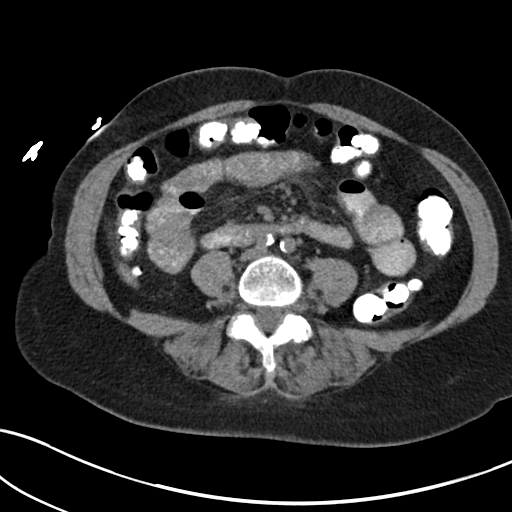
[im 49/79  soft-tissue]
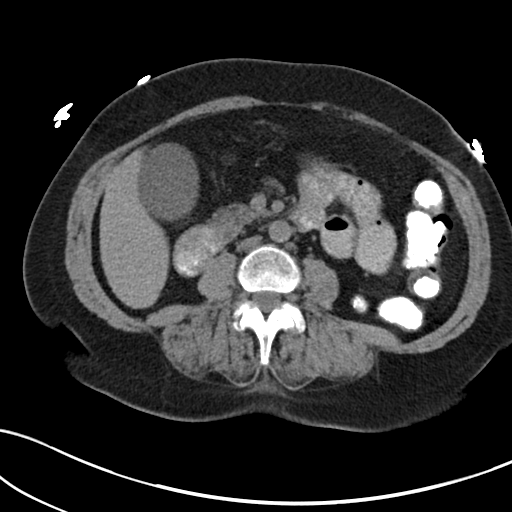
[im 49/79  bone]
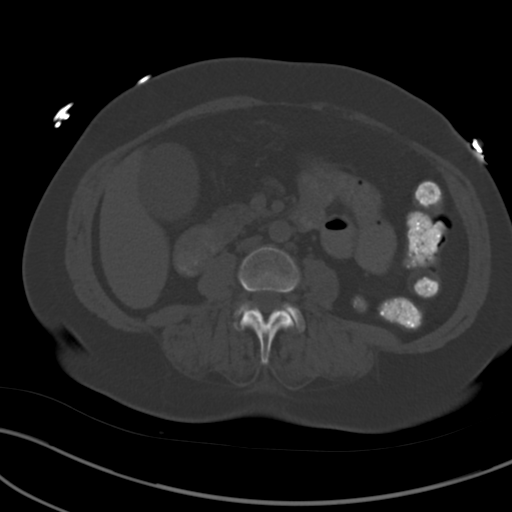
[im 52/79  soft-tissue]
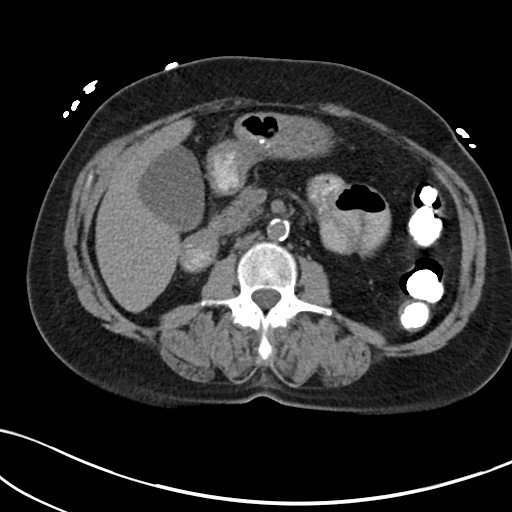
[im 58/79  soft-tissue]
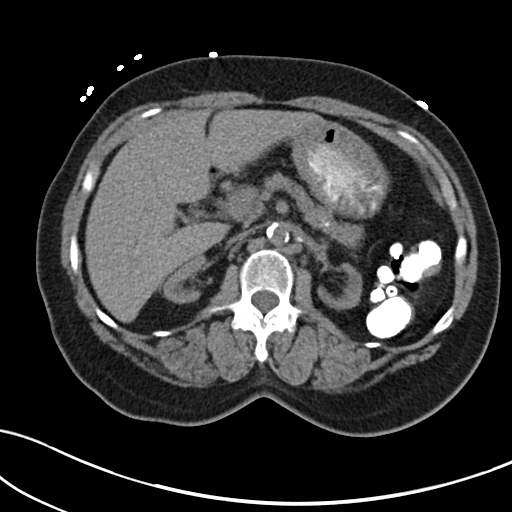
[im 64/79  soft-tissue]
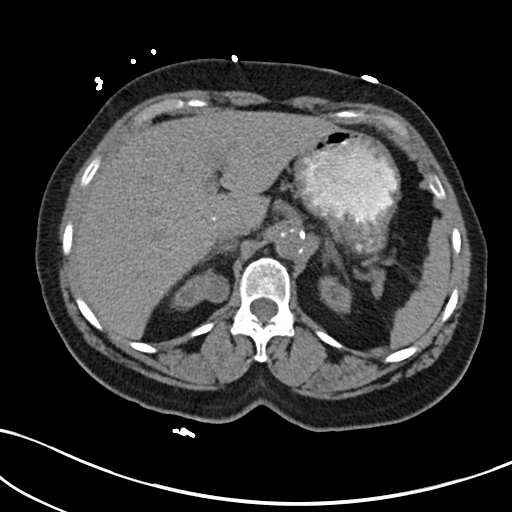
[im 70/79  soft-tissue]
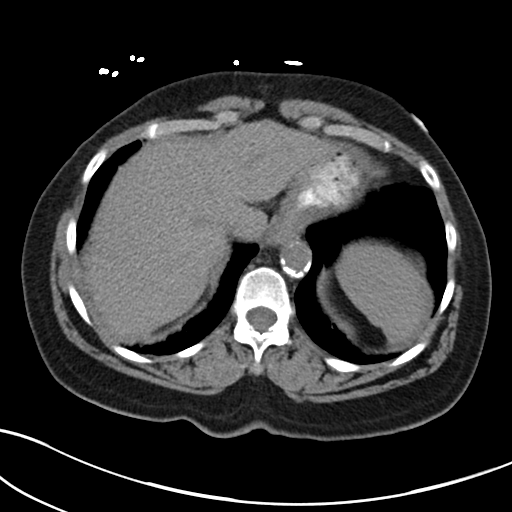
[im 76/79  soft-tissue]
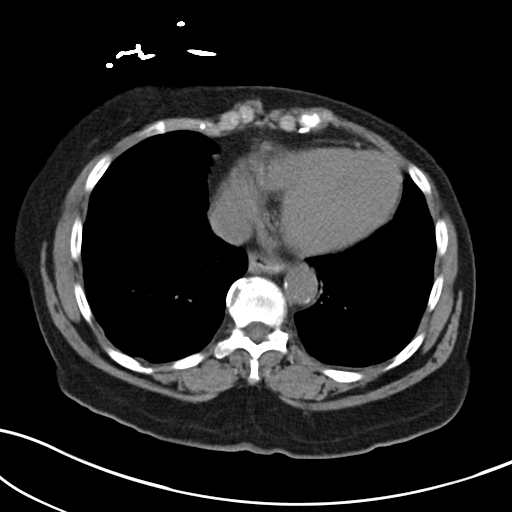

[Series 5: cor st · coronal · 0.85mm/px · 3 of 101 slices shown]
[im 34/101  soft-tissue]
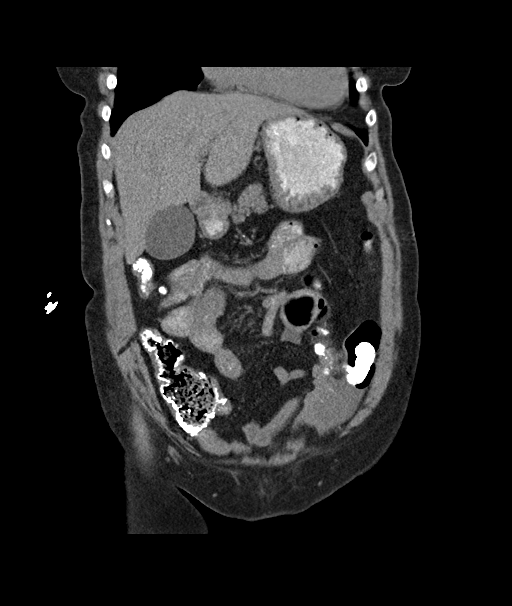
[im 45/101  soft-tissue]
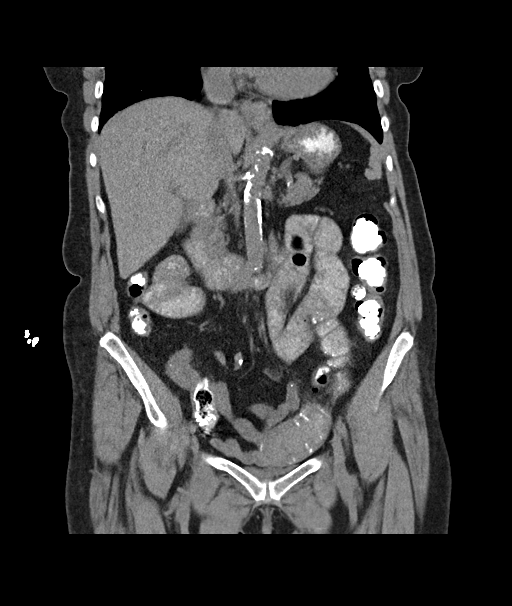
[im 56/101  soft-tissue]
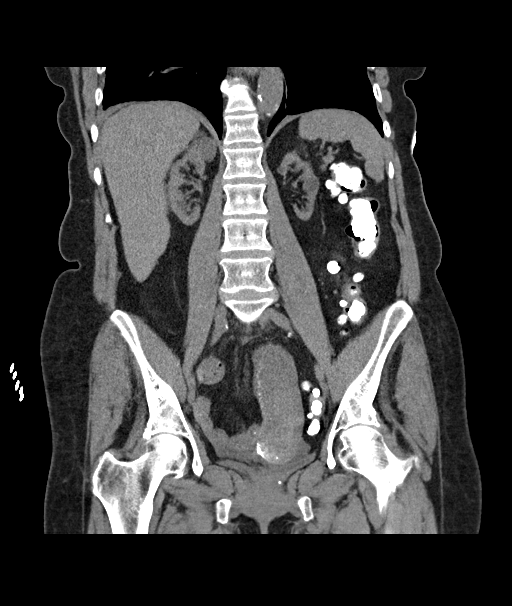

[17 of 46 positions shown; findings below may reference images not displayed]

FINDINGS: The lung bases are clear. Heart size is within normal limits. No
pleural effusion. Small hiatal hernia.

Liver gallbladder and bile ducts are normal. Pancreas and spleen
show no acute abnormality. Markedly atrophic kidneys compatible with
chronic renal failure. 2 cm right upper pole renal lesion unchanged.
No hydronephrosis.

Small bowel dilatation. There is a dilated loop of jejunum measuring
32 mm in diameter suggestive of small bowel obstruction. The colon
is decompressed. There is barium in the colon with colonic
diverticulosis present. No significant colonic thickening.

Negative for free fluid.  Negative for mass or adenopathy.
IMPRESSION: Small bowel dilatation consistent with partial small bowel
obstruction.

Colonic diverticulosis.

## 2013-05-29 MED ORDER — SODIUM CHLORIDE 0.9 % IJ SOLN
3.0000 mL | Freq: Two times a day (BID) | INTRAMUSCULAR | Status: DC
  Administered 2013-05-30 – 2013-06-01 (×4): 3 mL via INTRAVENOUS

## 2013-05-29 MED ORDER — NEPRO/CARBSTEADY PO LIQD: 237.0000 mL | ORAL | Status: DC | PRN

## 2013-05-29 MED ORDER — SODIUM CHLORIDE 0.9 % IJ SOLN: 3.0000 mL | INTRAMUSCULAR | Status: DC | PRN

## 2013-05-29 MED ORDER — MORPHINE SULFATE 2 MG/ML IJ SOLN
2.0000 mg | INTRAMUSCULAR | Status: DC | PRN
  Administered 2013-05-29 – 2013-06-01 (×3): 2 mg via INTRAVENOUS
  Filled 2013-05-29 (×4): qty 1

## 2013-05-29 MED ORDER — SODIUM CHLORIDE 0.9 % IV SOLN: 250.0000 mL | INTRAVENOUS | Status: DC

## 2013-05-29 MED ORDER — SODIUM CHLORIDE 0.9 % IV SOLN: 250.0000 mL | INTRAVENOUS | Status: DC | PRN

## 2013-05-29 MED ORDER — BARIUM SULFATE 2.1 % PO SUSP
900.0000 mL | Freq: Once | ORAL | Status: AC
  Administered 2013-05-29: 900 mL via ORAL

## 2013-05-29 MED ORDER — DIPHENHYDRAMINE HCL 50 MG/ML IJ SOLN
25.0000 mg | Freq: Once | INTRAMUSCULAR | Status: AC
  Administered 2013-05-29: 25 mg via INTRAVENOUS

## 2013-05-29 MED ORDER — LIDOCAINE-PRILOCAINE 2.5-2.5 % EX CREA: 1.0000 "application " | TOPICAL_CREAM | CUTANEOUS | Status: DC | PRN

## 2013-05-29 MED ORDER — MORPHINE SULFATE 4 MG/ML IJ SOLN
4.0000 mg | Freq: Once | INTRAMUSCULAR | Status: AC
  Administered 2013-05-29: 4 mg via INTRAVENOUS
  Filled 2013-05-29: qty 1

## 2013-05-29 MED ORDER — HYDRALAZINE HCL 20 MG/ML IJ SOLN: 5.0000 mg | Freq: Four times a day (QID) | INTRAMUSCULAR | Status: DC | PRN

## 2013-05-29 MED ORDER — HEPARIN SODIUM (PORCINE) 1000 UNIT/ML DIALYSIS
1000.0000 [IU] | INTRAMUSCULAR | Status: DC | PRN
  Filled 2013-05-29: qty 1

## 2013-05-29 MED ORDER — PENTAFLUOROPROP-TETRAFLUOROETH EX AERO: 1.0000 "application " | INHALATION_SPRAY | CUTANEOUS | Status: DC | PRN

## 2013-05-29 MED ORDER — LIDOCAINE HCL (PF) 1 % IJ SOLN: 5.0000 mL | INTRAMUSCULAR | Status: DC | PRN

## 2013-05-29 MED ORDER — DIPHENHYDRAMINE HCL 50 MG/ML IJ SOLN
12.5000 mg | Freq: Four times a day (QID) | INTRAMUSCULAR | Status: DC | PRN
  Administered 2013-05-29 – 2013-05-31 (×5): 12.5 mg via INTRAVENOUS
  Filled 2013-05-29 (×3): qty 1

## 2013-05-29 MED ORDER — CLONIDINE HCL 0.3 MG/24HR TD PTWK
0.3000 mg | MEDICATED_PATCH | TRANSDERMAL | Status: DC
  Administered 2013-05-29: 0.3 mg via TRANSDERMAL
  Filled 2013-05-29: qty 1

## 2013-05-29 MED ORDER — SODIUM CHLORIDE 0.9 % IV SOLN: 100.0000 mL | INTRAVENOUS | Status: DC | PRN

## 2013-05-29 MED ORDER — TOBRAMYCIN-DEXAMETHASONE 0.3-0.1 % OP SUSP
1.0000 [drp] | Freq: Four times a day (QID) | OPHTHALMIC | Status: DC
  Administered 2013-05-29 – 2013-06-01 (×7): 1 [drp] via OPHTHALMIC
  Filled 2013-05-29 (×2): qty 2.5

## 2013-05-29 MED ORDER — DIPHENHYDRAMINE HCL 50 MG/ML IJ SOLN
INTRAMUSCULAR | Status: AC
  Filled 2013-05-29: qty 1

## 2013-05-29 MED ORDER — BIOTENE DRY MOUTH MT LIQD
15.0000 mL | Freq: Two times a day (BID) | OROMUCOSAL | Status: DC
  Administered 2013-05-29 – 2013-05-31 (×6): 15 mL via OROMUCOSAL

## 2013-05-29 MED ORDER — SODIUM CHLORIDE 0.9 % IJ SOLN
3.0000 mL | Freq: Two times a day (BID) | INTRAMUSCULAR | Status: DC
  Administered 2013-05-29 – 2013-05-30 (×2): 3 mL via INTRAVENOUS

## 2013-05-29 MED ORDER — CHLORHEXIDINE GLUCONATE 0.12 % MT SOLN
15.0000 mL | Freq: Two times a day (BID) | OROMUCOSAL | Status: DC
  Administered 2013-05-29 – 2013-06-01 (×5): 15 mL via OROMUCOSAL
  Filled 2013-05-29 (×9): qty 15

## 2013-05-29 MED ORDER — ALTEPLASE 2 MG IJ SOLR
2.0000 mg | Freq: Once | INTRAMUSCULAR | Status: AC | PRN
  Filled 2013-05-29: qty 2

## 2013-05-29 MED ORDER — SODIUM CHLORIDE 0.9 % IV SOLN
INTRAVENOUS | Status: DC
  Administered 2013-05-29: 11:00:00 via INTRAVENOUS

## 2013-05-29 MED ORDER — DIPHENHYDRAMINE HCL 50 MG/ML IJ SOLN
25.0000 mg | Freq: Once | INTRAMUSCULAR | Status: AC
  Administered 2013-05-29: 25 mg via INTRAVENOUS
  Filled 2013-05-29: qty 1

## 2013-05-29 MED ORDER — ONDANSETRON HCL 4 MG PO TABS: 4.0000 mg | ORAL_TABLET | Freq: Four times a day (QID) | ORAL | Status: DC | PRN

## 2013-05-29 MED ORDER — HEPARIN SODIUM (PORCINE) 1000 UNIT/ML DIALYSIS
100.0000 [IU]/kg | INTRAMUSCULAR | Status: DC | PRN
  Filled 2013-05-29: qty 7

## 2013-05-29 NOTE — Progress Notes (Signed)
Admission note:  Arrival Method: from ED via stretcher Mental Orientation: A&Ox4 Telemetry: Yes, box 23 applied Assessment: See docflowsheets Skin: Intact IV: SL Rt hand Pain: C/O abdominal pain Tubes: N/A Safety Measures: Patient Handbook has been given, and discussed the Fall Prevention worksheet. Left at bedside  Admission: Completed and admission orders have been written  6E Orientation: Patient has been oriented to the unit, staff and to the room.  Family: Not present

## 2013-05-29 NOTE — H&P (Signed)
Date: 05/29/2013               Patient Name:  Shawna [name] MRN: [id number]  DOB: 09/15/1940 Age / Sex: 72 y.o., female   PCP: [name], MD         Medical Service: Internal Medicine Teaching Service         Attending Physician: Dr. Sid Falcon, MD    First Contact: Dr. Redmond Pulling Pager: [phone]  Second Contact: Dr. Algis Liming Pager: [phone]       After Hours (After 5p/  First Contact Pager: [phone]  weekends / holidays): Second Contact Pager: [phone]   Chief Complaint: Nausea, vomiting and abdominal pain  History of Present Illness:  The patient is a 72 year-old woman with a PMH of multiple abdominal surgeries including appendectomy, total hysterectomy and colon resection, CAD/CHF, s/p CABG in Dec 2013, ESRD MWF, HTN, recent hospitalization for UGIB in September 2014,  Who presents to the ED for acute nausea, vomiting and abdominal pain.  Patient states that she had sudden onset severe abdominal pain with associated nausea and vomiting brown stomach content about 7 PM after dinner last night.  She states that she only ate her usual dinner including some vegetables and apple sauce. Her abdomen pain was located at epigastric area, intermittent, sharp, 10/10 and radiating to her back. Her She had temporary relief of pain following emesis. Denies bloody emesis. She denies similar pain or problems in the past.  Denies fever, chills, rhinorrhea, chest pain/pressure, palpitation, shortness breath, cough, diarrhea or constipation. She was brought to the ED for evaluation and a abdominal CT scan showed evidence of partial small bowel obstruction. The IMTS is called for admission.    Meds: Current Facility-Administered Medications  Medication Dose Route Frequency Provider Last Rate Last Dose  . 0.9 %  sodium chloride infusion  250 mL Intravenous PRN Lavanda Nevels, MD      . 0.9 %  sodium chloride infusion   Intravenous Continuous Luismiguel Lamere, MD 10 mL/hr at 05/29/13 1127    . diphenhydrAMINE (BENADRYL) 50  MG/ML injection           . morphine 2 MG/ML injection 2 mg  2 mg Intravenous Q4H PRN Brnadon Eoff, MD   2 mg at 05/29/13 1127  . sodium chloride 0.9 % injection 3 mL  3 mL Intravenous Q12H Alexsandro Salek, MD      . sodium chloride 0.9 % injection 3 mL  3 mL Intravenous Q12H Sai Zinn, MD   3 mL at 05/29/13 1127  . sodium chloride 0.9 % injection 3 mL  3 mL Intravenous PRN Aara Jacquot Nicoletta Dress, MD      . tobramycin-dexamethasone (TOBRADEX) ophthalmic suspension 1 drop  1 drop Left Eye QID Charlann Lange, MD        Allergies: Allergies as of 05/28/2013 - Review Complete 05/28/2013  Allergen Reaction Noted  . Aspirin Other (See Comments) 12/15/2011  . Contrast media [iodinated diagnostic agents] Itching 12/15/2011  . Iron Itching and Other (See Comments) 05/02/2012  . Macrodantin [nitrofurantoin macrocrystal] Other (See Comments) 12/15/2011  . Penicillins Other (See Comments) 12/15/2011  . Plavix [clopidogrel bisulfate] Rash 01/05/2012  . Bactrim [sulfamethoxazole-trimethoprim] Rash 12/15/2011  . Sulfa antibiotics Rash 12/15/2011  . Venofer [ferric oxide] Itching 05/02/2012  . Protonix [pantoprazole sodium] Rash 03/21/2013   Past Medical History  Diagnosis Date  . ESRD on hemodialysis     ESRD due to HTN, started dialysis 2011 and gets HD at [facility] with  Dr Hinda Lenis on MWF schedule.  Access is LUA AVF as of Sept 2014.   Marland Kitchen Chronic bronchitis   . GERD (gastroesophageal reflux disease)   . PUD (peptic ulcer disease)   . History of lower GI bleeding   . Arthritis   . History of gout   . CAD (coronary artery disease)     a. 12/2011 NSTEMI/Cath/PCI LCX (2.25x14 Resolute DES) & D1 (2.25x22 Resolute DES);  b. 01/2012 Cath/PCI: LM 30, LAD 30p, 40-55m, D1 stent ok, 99 in sm branch of diag, LCX patent stent, OM1 20, RCA 95 ost (4.0x12 Promus DES), EF 55%;  c. 04/2012 Lexi Cardiolite  EF 48%, small area of scar @ base/mid inflat wall with mild peri-infarct ischemia.; CABG 12/4  . High cholesterol 12/2011  . Pneumonia ~ 2009  . Iron  deficiency anemia   . TIA (transient ischemic attack)   . Anxiety   . History of blood transfusion 07/2011; 12/2011; 01/2012 X 2; 04/2012  . Carotid artery disease     a. [id number] LICA, [id number]   . Mitral regurgitation     a. Moderate by echo, 02/2012  . Myocardial infarction   . Ovarian cancer 1992  . Colon cancer 1992  . Chronic diastolic CHF (congestive heart failure)     a. 02/2012 Echo EF 60-65%, nl wall motion, Gr 1 DD, mod MR  . Hypertension   . AVM (arteriovenous malformation) of colon   . Esophageal stricture    Past Surgical History  Procedure Laterality Date  . Abdominal hysterectomy  1992  . Appendectomy  06/1990  . Tubal ligation  1980's  . Av fistula placement  07/2009    left upper arm  . Thrombectomy / arteriovenous graft revision  2011    left upper arm  . Colon resection  1992  . Esophagogastroduodenoscopy  01/20/2012    Procedure: ESOPHAGOGASTRODUODENOSCOPY (EGD);  Surgeon: Ladene Artist, MD,FACG;  Location: [facility] ENDOSCOPY;  Service: Endoscopy;  Laterality: N/A;  . Dilation and curettage of uterus    . Coronary angioplasty with stent placement  12/15/11    "2"  . Coronary angioplasty with stent placement  y/2013    "1; makes total of 3" (05/02/2012)  . Coronary artery bypass graft  06/13/2012    Procedure: CORONARY ARTERY BYPASS GRAFTING (CABG);  Surgeon: Grace Isaac, MD;  Location: [place];  Service: Open Heart Surgery;  Laterality: N/A;  cabg x four;  using left internal mammary artery, and left leg greater saphenous vein harvested endoscopically  . Intraoperative transesophageal echocardiogram  06/13/2012    Procedure: INTRAOPERATIVE TRANSESOPHAGEAL ECHOCARDIOGRAM;  Surgeon: Grace Isaac, MD;  Location: [place];  Service: Open Heart Surgery;  Laterality: N/A;  . Colectomy  1992  . Esophagogastroduodenoscopy N/A 03/26/2013    Procedure: ESOPHAGOGASTRODUODENOSCOPY (EGD);  Surgeon: Irene Shipper, MD;  Location: [facility] ENDOSCOPY;  Service: Endoscopy;  Laterality: N/A;  .  Stomach surgery     Family History  Problem Relation Age of Onset  . Other      noncontributory for early CAD  . Heart disease Mother     Heart Disease before age 65  . Hyperlipidemia Mother   . Hypertension Mother   . Heart disease Father   . Hyperlipidemia Father   . Hypertension Father    History   Social History  . Marital Status: Married    Spouse Name: N/A    Number of Children: N/A  . Years of Education: N/A   Occupational History  .  Not on file.   Social History Main Topics  . Smoking status: Never Smoker   . Smokeless tobacco: Never Used  . Alcohol Use: No  . Drug Use: No  . Sexual Activity: Yes   Other Topics Concern  . Not on file   Social History Narrative   Lives in [place], [place] with husband.  Dialysis pt - mwf.    Review of Systems: Review of Systems:  Constitutional:  Denies fever, chills, diaphoresis, and fatigue. Decreased appetite due to nausea and vomiting.    HEENT:  Denies congestion, sore throat, rhinorrhea, sneezing, mouth sores, trouble swallowing, neck pain   Respiratory:  Denies SOB, DOE, cough, and wheezing.   Cardiovascular:  Denies palpitations and leg swelling.   Gastrointestinal:   positive for nausea, vomiting, abdominal pain and abdominal distention, denies diarrhea, constipation, blood in stool.   Genitourinary:  Denies dysuria, urgency, frequency, hematuria, flank pain and difficulty urinating.   Musculoskeletal:  Denies myalgias, back pain, joint swelling, arthralgias and gait problem.   Skin:  Denies pallor, rash and wound.   Neurological:  Denies dizziness, seizures, syncope, weakness, light-headedness, numbness and headaches.    .    Physical Exam: Blood pressure 180/69, pulse 85, temperature 98.8 F (37.1 C), temperature source Oral, resp. rate 20, weight 142 lb 1.6 oz (64.456 kg), SpO2 100.00%. General: mild distress due to abdominal pain.  Head: normocephalic and atraumatic.  Eyes: vision grossly intact, pupils equal,  pupils round, pupils reactive to light, no injection and anicteric.  Mouth: pharynx pink and moist, no erythema, and no exudates.  Neck: supple, full ROM, no thyromegaly, no JVD, and no carotid bruits.  Lungs: normal respiratory effort, no accessory muscle use, normal breath sounds, no crackles, and no wheezes. Heart: normal rate, regular rhythm, no murmur, no gallop, and no rub.  Abdomen: soft, mild distended, mild-tender to epigastric area,  no guarding, no rebound tenderness, no hepatomegaly, and no splenomegaly.  Hyperactive bowel sounds at LUQ and RUQ. Hypoactive BS at RLQ and LLQ.  Msk: no joint swelling, no joint warmth, and no redness over joints.  Pulses: 1+ DP/PT pulses bilaterally Extremities: No cyanosis, clubbing, edema. Left arm AV fistula with bruit.  Neurologic: alert & oriented X3, cranial nerves II-XII intact, strength normal in all extremities, sensation intact to light touch, and gait normal.  Skin: turgor normal and no rashes.  Psych: Oriented X3, memory intact for recent and remote, normally interactive, good eye contact, not anxious appearing, and not depressed appearing.   Lab results: Basic Metabolic Panel:  Recent Labs  05/28/13 2158  Joslynne Klatt 139  K 4.8  CL 93*  CO2 25  GLUCOSE 143*  BUN 56*  CREATININE 10.08*  CALCIUM 9.5   Liver Function Tests:  Recent Labs  05/28/13 2158  AST 31  ALT 15  ALKPHOS 126*  BILITOT 0.4  PROT 8.1  ALBUMIN 4.3    Recent Labs  05/28/13 2158  LIPASE 47  CBC:  Recent Labs  05/28/13 2158  WBC 12.1*  NEUTROABS 10.1*  HGB 13.1  HCT 38.7  MCV 95.8  PLT 289    Imaging results:  Ct Abdomen Pelvis Wo Contrast  05/29/2013   CLINICAL DATA:  Abdominal pain. Nausea and vomiting. Chronic renal failure  EXAM: CT ABDOMEN AND PELVIS WITHOUT CONTRAST  TECHNIQUE: Multidetector CT imaging of the abdomen and pelvis was performed following the standard protocol without intravenous contrast.  COMPARISON:  CT 09/24/2009  FINDINGS:  The lung bases are clear. Heart size  is within normal limits. No pleural effusion. Small hiatal hernia.  Liver gallbladder and bile ducts are normal. Pancreas and spleen show no acute abnormality. Markedly atrophic kidneys compatible with chronic renal failure. 2 cm right upper pole renal lesion unchanged. No hydronephrosis.  Small bowel dilatation. There is a dilated loop of jejunum measuring 32 mm in diameter suggestive of small bowel obstruction. The colon is decompressed. There is barium in the colon with colonic diverticulosis present. No significant colonic thickening.  Negative for free fluid.  Negative for mass or adenopathy.  IMPRESSION: Small bowel dilatation consistent with partial small bowel obstruction.  Colonic diverticulosis.   Electronically Signed   By: Franchot Gallo M.D.   On: 05/29/2013 07:28   Portable Chest 1 View  05/29/2013   CLINICAL DATA:  Cough, abdominal pain  EXAM: PORTABLE CHEST - 1 VIEW  COMPARISON:  02/20/2013.  FINDINGS: The heart size and mediastinal contours are within normal limits. There is evidence of prior CABG. There is left basilar scarring. The lungs are otherwise clear. No pleural effusion or pneumothorax. There are degenerative changes of bilateral acromioclavicular joints.  IMPRESSION: No active disease.   Electronically Signed   By: Kathreen Devoid   On: 05/29/2013 12:11    Assessment & Plan by Problem:   The patient is a 72 year-old woman with a PMH of multiple abdominal surgeries including appendectomy, total hysterectomy and colon resection, CAD/CHF, s/p CABG in Dec 2013, ESRD MWF, HTN, recent hospitalization for UGIB in September 2014,  Who presents to the ED for acute nausea, vomiting and abdominal pain. Abd CT showed evidence of partial small bowel obstruction.  # Small bowel obstruction, partial     Patient presents with acute onset of the nausea, vomiting, abdominal distention and pain x1 day. Physical examination revealed epigastric area tenderness and  mild abd distention. And abdomen CT scan confirmed partial small bowel obstruction. The etiology may associated with her past hx of multiple abdominal surgeries.        She has a  history of ovarian cancer and colon cancer about 20 years ago, and states that she is cancer free since 60. She denies B symptoms and no indication of abnormal tumor/lesion on abd CT, which make cancer relapse less likely.   Plan: - admit to Tele given her extensive cardiac history and acute sickness - NPO-- - NG tube  Patient is not nauseated and refuses NG tube for now  She agrees with NG tube if she has more nausea and vomiting    - IVF at 50 ml/hour for hydration and maintenance x 24 hours   caution fluid balance since she is ESRD patient.   Need to stop IV as soon as she can take po  Last Echo in Oct 2014 showed EF 55% and grade 1 diastolic dysfunction. - pain management as need  # Essential hypertension, benign   Her home meds include Amlodipine, clonidine, hydralazine and PRN labetalol.  - All oral meds on hold when NPO - Clonidine patch and Hydralazine PRN ordered after consulting pharmacy. - Will need to resume all home meds once she can tolerate po.   # ESRD on hemodialysis and  Anemia of chronic kidney failure  - Appreciate renal consult - patient will continue to have HD while hospitalized.   # HLD (hyperlipidemia)--hold home meds    # GERD (gastroesophageal reflux disease)--hold PPI  # Hx of CABG--stable  # VTE: SCD given recent hospitalization for UGIB with multiple blood transfusion.  Dispo: Disposition is deferred at this time, awaiting improvement of current medical problems. Anticipated discharge in approximately 1-2 day(s).   The patient does have a current PCP [name], MD) and does not need an [facility] hospital follow-up appointment after discharge.  The patient does not have transportation limitations that hinder transportation to clinic appointments.  Signed: Charlann Lange, MD PGY-3 IMTS 05/29/2013, 12:51 PM

## 2013-05-29 NOTE — Procedures (Signed)
I was present at this session.  I have reviewed the session itself and made appropriate changes.  Hd via LUA avf, discussed need for 4 h.  Shawna [name] L 11/19/20145:59 PM

## 2013-05-29 NOTE — ED Notes (Signed)
Tried to call CT x 2 to advise that patient has finished her barium.

## 2013-05-29 NOTE — ED Notes (Signed)
Per admitting MD, pt refuses NG tube at this time.

## 2013-05-29 NOTE — ED Notes (Signed)
Family at bedside. 

## 2013-05-29 NOTE — ED Notes (Signed)
Pt reports itching over body.

## 2013-05-29 NOTE — Progress Notes (Signed)
UR completed 

## 2013-05-29 NOTE — Consult Note (Signed)
Reason for Consult:ESRD , HTN Referring Physician: Dr. Reynaldo Minium Shawna [name] is an 72 y.o. female.  HPI: 72 yr old female with hx ESRD for 3 yr, ?HTN etio.  Long hx other prob including recent admit for GIB (duodenal AVF), hx CAD s/p CABG and stents prior, hx ovarian Ca 1992, colon resx at that time, GERD, ^ lipids, HTN, anemia, HPTH, Carotid dz,.  Last pm after eating severe upper abdm pain, N, V x2 at home and here x2..  She thought it was applesauce but it did not go away.  Admitted for SBO.  Abdm surgeries as above and appy just prior to that and tubal ligation.  No recent n, v, or blood in stool since 9/14.  HD MWF , andrecently bps better.  Dialyzes at [facility] on MWF since 2011. Primary Nephrologist [place]. EDW unknown. HD Bath unknown, Dialyzer unknown, Heparin unknown. Access LUA AVF Constitutional: has been doing well. chills assoc with this event Eyes: recent prob with L eye Ears, nose, mouth, throat, and face: negative Respiratory: negative Cardiovascular: no edema, sleeps on 3 pillows Gastrointestinal: as above Genitourinary:very little urine Integument/breast: negative Musculoskeletal:pain in feet Neurological: negative Endocrine: negative Allergic/Immunologic: see list .  Past Medical History  Diagnosis Date  . ESRD on hemodialysis     ESRD due to HTN, started dialysis 2011 and gets HD at [facility] with Dr Hinda Lenis on MWF schedule.  Access is LUA AVF as of Sept 2014.   Marland Kitchen Chronic bronchitis   . GERD (gastroesophageal reflux disease)   . PUD (peptic ulcer disease)   . History of lower GI bleeding   . Arthritis   . History of gout   . CAD (coronary artery disease)     a. 12/2011 NSTEMI/Cath/PCI LCX (2.25x14 Resolute DES) & D1 (2.25x22 Resolute DES);  b. 01/2012 Cath/PCI: LM 30, LAD 30p, 40-51m, D1 stent ok, 99 in sm branch of diag, LCX patent stent, OM1 20, RCA 95 ost (4.0x12 Promus DES), EF 55%;  c. 04/2012 Lexi Cardiolite  EF 48%, small area of scar @ base/mid  inflat wall with mild peri-infarct ischemia.; CABG 12/4  . High cholesterol 12/2011  . Pneumonia ~ 2009  . Iron deficiency anemia   . TIA (transient ischemic attack)   . Anxiety   . History of blood transfusion 07/2011; 12/2011; 01/2012 X 2; 04/2012  . Carotid artery disease     a. [id number] LICA, [id number]   . Mitral regurgitation     a. Moderate by echo, 02/2012  . Myocardial infarction   . Ovarian cancer 1992  . Colon cancer 1992  . Chronic diastolic CHF (congestive heart failure)     a. 02/2012 Echo EF 60-65%, nl wall motion, Gr 1 DD, mod MR  . Hypertension   . AVM (arteriovenous malformation) of colon   . Esophageal stricture     Past Surgical History  Procedure Laterality Date  . Abdominal hysterectomy  1992  . Appendectomy  06/1990  . Tubal ligation  1980's  . Av fistula placement  07/2009    left upper arm  . Thrombectomy / arteriovenous graft revision  2011    left upper arm  . Colon resection  1992  . Esophagogastroduodenoscopy  01/20/2012    Procedure: ESOPHAGOGASTRODUODENOSCOPY (EGD);  Surgeon: Ladene Artist, MD,FACG;  Location: [facility] ENDOSCOPY;  Service: Endoscopy;  Laterality: N/A;  . Dilation and curettage of uterus    . Coronary angioplasty with stent placement  12/15/11    "2"  .  Coronary angioplasty with stent placement  y/2013    "1; makes total of 3" (05/02/2012)  . Coronary artery bypass graft  06/13/2012    Procedure: CORONARY ARTERY BYPASS GRAFTING (CABG);  Surgeon: Grace Isaac, MD;  Location: [place];  Service: Open Heart Surgery;  Laterality: N/A;  cabg x four;  using left internal mammary artery, and left leg greater saphenous vein harvested endoscopically  . Intraoperative transesophageal echocardiogram  06/13/2012    Procedure: INTRAOPERATIVE TRANSESOPHAGEAL ECHOCARDIOGRAM;  Surgeon: Grace Isaac, MD;  Location: [place];  Service: Open Heart Surgery;  Laterality: N/A;  . Colectomy  1992  . Esophagogastroduodenoscopy N/A 03/26/2013    Procedure:  ESOPHAGOGASTRODUODENOSCOPY (EGD);  Surgeon: Irene Shipper, MD;  Location: [facility] ENDOSCOPY;  Service: Endoscopy;  Laterality: N/A;  . Stomach surgery      Family History  Problem Relation Age of Onset  . Other      noncontributory for early CAD  . Heart disease Mother     Heart Disease before age 80  . Hyperlipidemia Mother   . Hypertension Mother   . Heart disease Father   . Hyperlipidemia Father   . Hypertension Father     Social History:  reports that she has never smoked. She has never used smokeless tobacco. She reports that she does not drink alcohol or use illicit drugs.  Allergies:  Allergies  Allergen Reactions  . Aspirin Other (See Comments)    Mess up her stomach; "makes my bowels have blood in them". Takes 81 mg EC Aspirin   . Contrast Media [Iodinated Diagnostic Agents] Itching  . Iron Itching and Other (See Comments)    "they gave me iron in dialysis; had to give me Benadryl cause I had to have the iron" (05/02/2012)  . Macrodantin [Nitrofurantoin Macrocrystal] Other (See Comments)    "broke me out in big old knots all over my body; had to go to ER"  . Penicillins Other (See Comments)    "makes me real weak when I take it; like I'll pass out"  . Plavix [Clopidogrel Bisulfate] Rash  . Bactrim [Sulfamethoxazole-Trimethoprim] Rash  . Sulfa Antibiotics Rash  . Venofer [Ferric Oxide] Itching    Patient reports using Benadryl prior to doses as [facility]  . Dexilant [Dexlansoprazole] Other (See Comments)    Upset stomach  . Protonix [Pantoprazole Sodium] Rash    Medications:  I have reviewed the patient's current medications. Prior to Admission:  Prescriptions prior to admission  Medication Sig Dispense Refill  . ALPRAZolam (XANAX) 0.5 MG tablet Take 0.5 mg by mouth 3 (three) times daily.      Marland Kitchen amLODipine (NORVASC) 10 MG tablet Take 10 mg by mouth daily.      Marland Kitchen aspirin 81 MG chewable tablet Chew 1 tablet (81 mg total) by mouth daily.      . cloNIDine  (CATAPRES) 0.3 MG tablet Take 0.3 mg by mouth daily.      . fluticasone (FLONASE) 50 MCG/ACT nasal spray Place 2 sprays into the nose daily.      . folic acid (FOLVITE) 1 MG tablet Take 1 tablet (1 mg total) by mouth daily.  30 tablet  6  . folic acid-vitamin b complex-vitamin c-selenium-zinc (DIALYVITE) 3 MG TABS Take 1 tablet by mouth daily.      . hydrALAZINE (APRESOLINE) 25 MG tablet Take 25 mg by mouth 2 (two) times daily.      . hydrOXYzine (ATARAX/VISTARIL) 25 MG tablet Take 25 mg by mouth  daily as needed for itching.       . iron polysaccharides (NIFEREX) 150 MG capsule Take 150 mg by mouth daily.      Marland Kitchen labetalol (NORMODYNE) 200 MG tablet Take 200 mg by mouth daily as needed (only takes for elevated blood pressure).       Marland Kitchen lanthanum (FOSRENOL) 1000 MG chewable tablet Chew 1,000 mg by mouth 3 (three) times daily after meals.      . lidocaine-prilocaine (EMLA) cream Apply 1 application topically every Monday, Wednesday, and Friday.       . loratadine (CLARITIN) 10 MG tablet Take 10 mg by mouth daily.       Marland Kitchen omeprazole (PRILOSEC) 40 MG capsule Take 1 capsule (40 mg total) by mouth daily.  30 capsule  11  . polyethylene glycol (MIRALAX / GLYCOLAX) packet Take 17 g by mouth daily as needed for mild constipation.      . SENSIPAR 30 MG tablet Take 30 mg by mouth daily.       Marland Kitchen tobramycin-dexamethasone (TOBRADEX) ophthalmic solution Place 1 drop into the left eye 4 (four) times daily.       . traMADol (ULTRAM) 50 MG tablet Take 50 mg by mouth every 6 (six) hours as needed for pain.       Marland Kitchen albuterol (PROVENTIL HFA;VENTOLIN HFA) 108 (90 BASE) MCG/ACT inhaler Inhale 2 puffs into the lungs every 6 (six) hours as needed for wheezing or shortness of breath.       Marland Kitchen LIPITOR 20 MG tablet TAKE 1 BY MOUTH DAILY AT   6PM *CHRISTOPHER BERGE,NP*  30 tablet  2  . nitroGLYCERIN (NITROSTAT) 0.4 MG SL tablet Place 0.4 mg under the tongue every 5 (five) minutes as needed for chest pain.      Marland Kitchen zolpidem (AMBIEN)  10 MG tablet          Results for orders placed during the hospital encounter of 05/28/13 (from the past 48 hour(s))  CBC WITH DIFFERENTIAL     Status: Abnormal   Collection Time    05/28/13  9:58 PM      Result Value Range   WBC 12.1 (*) 4.0 - 10.5 K/uL   RBC 4.04  3.87 - 5.11 MIL/uL   Hemoglobin 13.1  12.0 - 15.0 g/dL   HCT 38.7  36.0 - 46.0 %   MCV 95.8  78.0 - 100.0 fL   MCH 32.4  26.0 - 34.0 pg   MCHC 33.9  30.0 - 36.0 g/dL   RDW 17.8 (*) 11.5 - 15.5 %   Platelets 289  150 - 400 K/uL   Neutrophils Relative % 84 (*) 43 - 77 %   Neutro Abs 10.1 (*) 1.7 - 7.7 K/uL   Lymphocytes Relative 6 (*) 12 - 46 %   Lymphs Abs 0.8  0.7 - 4.0 K/uL   Monocytes Relative 6  3 - 12 %   Monocytes Absolute 0.7  0.1 - 1.0 K/uL   Eosinophils Relative 4  0 - 5 %   Eosinophils Absolute 0.4  0.0 - 0.7 K/uL   Basophils Relative 1  0 - 1 %   Basophils Absolute 0.1  0.0 - 0.1 K/uL  COMPREHENSIVE METABOLIC PANEL     Status: Abnormal   Collection Time    05/28/13  9:58 PM      Result Value Range   Sodium 139  135 - 145 mEq/L   Potassium 4.8  3.5 - 5.1 mEq/L   Comment: HEMOLYSIS AT  THIS LEVEL MAY AFFECT RESULT   Chloride 93 (*) 96 - 112 mEq/L   CO2 25  19 - 32 mEq/L   Glucose, Bld 143 (*) 70 - 99 mg/dL   BUN 56 (*) 6 - 23 mg/dL   Creatinine, Ser 10.08 (*) 0.50 - 1.10 mg/dL   Calcium 9.5  8.4 - 10.5 mg/dL   Total Protein 8.1  6.0 - 8.3 g/dL   Albumin 4.3  3.5 - 5.2 g/dL   AST 31  0 - 37 U/L   Comment: HEMOLYSIS AT THIS LEVEL MAY AFFECT RESULT   ALT 15  0 - 35 U/L   Alkaline Phosphatase 126 (*) 39 - 117 U/L   Total Bilirubin 0.4  0.3 - 1.2 mg/dL   GFR calc non Af Amer 3 (*) >90 mL/min   GFR calc Af Amer 4 (*) >90 mL/min   Comment: (NOTE)     The eGFR has been calculated using the CKD EPI equation.     This calculation has not been validated in all clinical situations.     eGFR's persistently <90 mL/min signify possible Chronic Kidney     Disease.  LIPASE, BLOOD     Status: None   Collection  Time    05/28/13  9:58 PM      Result Value Range   Lipase 47  11 - 59 U/L  POCT I-STAT TROPONIN I     Status: None   Collection Time    05/28/13 10:23 PM      Result Value Range   Troponin i, poc 0.03  0.00 - 0.08 ng/mL   Comment 3            Comment: Due to the release kinetics of cTnI,     a negative result within the first hours     of the onset of symptoms does not rule out     myocardial infarction with certainty.     If myocardial infarction is still suspected,     repeat the test at appropriate intervals.    Ct Abdomen Pelvis Wo Contrast  05/29/2013   CLINICAL DATA:  Abdominal pain. Nausea and vomiting. Chronic renal failure  EXAM: CT ABDOMEN AND PELVIS WITHOUT CONTRAST  TECHNIQUE: Multidetector CT imaging of the abdomen and pelvis was performed following the standard protocol without intravenous contrast.  COMPARISON:  CT 09/24/2009  FINDINGS: The lung bases are clear. Heart size is within normal limits. No pleural effusion. Small hiatal hernia.  Liver gallbladder and bile ducts are normal. Pancreas and spleen show no acute abnormality. Markedly atrophic kidneys compatible with chronic renal failure. 2 cm right upper pole renal lesion unchanged. No hydronephrosis.  Small bowel dilatation. There is a dilated loop of jejunum measuring 32 mm in diameter suggestive of small bowel obstruction. The colon is decompressed. There is barium in the colon with colonic diverticulosis present. No significant colonic thickening.  Negative for free fluid.  Negative for mass or adenopathy.  IMPRESSION: Small bowel dilatation consistent with partial small bowel obstruction.  Colonic diverticulosis.   Electronically Signed   By: Franchot Gallo M.D.   On: 05/29/2013 07:28   Portable Chest 1 View  05/29/2013   CLINICAL DATA:  Cough, abdominal pain  EXAM: PORTABLE CHEST - 1 VIEW  COMPARISON:  02/20/2013.  FINDINGS: The heart size and mediastinal contours are within normal limits. There is evidence of  prior CABG. There is left basilar scarring. The lungs are otherwise clear. No pleural effusion or pneumothorax. There  are degenerative changes of bilateral acromioclavicular joints.  IMPRESSION: No active disease.   Electronically Signed   By: Kathreen Devoid   On: 05/29/2013 12:11    ROS Blood pressure 180/69, pulse 85, temperature 98.8 F (37.1 C), temperature source Oral, resp. rate 20, height 5\' 1"  (1.549 m), weight 63.504 kg (140 lb), SpO2 100.00%. Physical Exam Physical Examination: General appearance - alert, well appearing, and in no distress Mental status - alert, oriented to person, place, and time Eyes - pupils equal and reactive, extraocular eye movements intact Mouth - few missing teeth Neck - adenopathy noted PCL Lymphatics - posterior cervical nodes Chest - decreased air entry noted bilat Heart - S1 and S2 normal, systolic murmur [id number] at apex Abdomen - mild to mod distension, no bs ,diffusely tender Musculoskeletal - hypertrophic changes in hands Extremities - peripheral pulses normal, no pedal edema, no clubbing or cyanosis, AVF LUA B&T Skin - few moles and acrocordons  Assessment/Plan: 1 SBO ? Related to old surgeries but not clear at this interval.  If not better in 48 h needs intervention 2 ESRD: will plan HD, get records, remove vol 3 Hypertension: lower vol now and reassess 4. Anemia of ESRD: good hb, check Fe 5. Metabolic Bone Disease: check PTH 6 CAD P HD, check Fe, PTH, get records.  Karter Haire L 05/29/2013, 1:42 PM

## 2013-05-29 NOTE — ED Notes (Signed)
Went to discharge patient and she advised that she is starting to have pain and was worried about going home.  Advised MD that she is anxious about going home and starting to have pain.   MD advised reevaluate in 30 minutes.  If patient is feeling better, send home.  If not, then he will go back and see patient again.

## 2013-05-29 NOTE — ED Notes (Signed)
Called CT x 2 again and still no answer.

## 2013-05-29 NOTE — H&P (Signed)
  Date: 05/29/2013  Patient name: Shawna [name]  Medical record number: [id number]  Date of birth: [birth year]   I have seen and evaluated Shawna [name] and discussed their care with the Residency Team.  Spoke with Shawna [name] and confirmed HPI.  She had the sudden onset of pain around 7pm last night after dinner, she notes prior to this that she was in her normal state of health.  The pain is epigastric, sharp and radiating to the back, improved with pain medication.  She has also had nausea and vomiting without blood.  She has a history of multiple abdominal surgeries.  She is also a dialysis patient and was wanting to know if she could have dialysis tonight.    Assessment and Plan: I have seen and evaluated the patient as outlined above. I agree with the formulated Assessment and Plan as detailed in the residents' admission note, with the following changes:   1. Partial SBO, likely etiology is adhesions from previous surgery.  CT abdomen showed the bowel obstruction - Agree with plan for NPO, telemetry - patient prefers no NGT today, I discussed with her indications for placement including increased distention, pain or uncontrollable vomiting, she expressed understanding - Low rate IVF for 24 hours - Pain and nausea management with IV medications.   2. ESRD on HD - Will consult renal for HD while in house  3. HTN - Hold oral meds - Change to clonidine patch and prn hydralazine as noted in resident note.   Other issues per resident note.   Sid Falcon, MD 11/19/20143:11 PM

## 2013-05-30 LAB — BASIC METABOLIC PANEL
CO2: 27 mEq/L (ref 19–32)
Calcium: 9.1 mg/dL (ref 8.4–10.5)
Creatinine, Ser: 6.06 mg/dL — ABNORMAL HIGH (ref 0.50–1.10)
GFR calc non Af Amer: 6 mL/min — ABNORMAL LOW (ref >90)
Glucose, Bld: 74 mg/dL (ref 70–99)
Sodium: 140 mEq/L (ref 135–145)

## 2013-05-30 LAB — CBC
HCT: 35.4 % — ABNORMAL LOW (ref 36.0–46.0)
MCH: 31.5 pg (ref 26.0–34.0)
MCHC: 31.9 g/dL (ref 30.0–36.0)
Platelets: 248 10*3/uL (ref 150–400)
RBC: 3.59 MIL/uL — ABNORMAL LOW (ref 3.87–5.11)
RDW: 18.3 % — ABNORMAL HIGH (ref 11.5–15.5)

## 2013-05-30 LAB — GLUCOSE, CAPILLARY
Glucose-Capillary: 73 mg/dL (ref 70–99)
Glucose-Capillary: 82 mg/dL (ref 70–99)

## 2013-05-30 LAB — PTH, INTACT AND CALCIUM
Calcium, Total (PTH): 8.1 mg/dL — ABNORMAL LOW (ref 8.4–10.5)
PTH: 709.7 pg/mL — ABNORMAL HIGH (ref 14.0–72.0)

## 2013-05-30 MED ORDER — RENA-VITE PO TABS
1.0000 | ORAL_TABLET | Freq: Every day | ORAL | Status: DC
  Administered 2013-05-30: 1 via ORAL
  Filled 2013-05-30 (×3): qty 1

## 2013-05-30 MED ORDER — LANTHANUM CARBONATE 500 MG PO CHEW
1000.0000 mg | CHEWABLE_TABLET | Freq: Three times a day (TID) | ORAL | Status: DC
  Administered 2013-05-30 – 2013-06-01 (×5): 1000 mg via ORAL
  Filled 2013-05-30 (×9): qty 2

## 2013-05-30 MED ORDER — PROMETHAZINE HCL 25 MG/ML IJ SOLN
12.5000 mg | Freq: Three times a day (TID) | INTRAMUSCULAR | Status: DC | PRN
  Administered 2013-05-31: 12.5 mg via INTRAVENOUS

## 2013-05-30 MED ORDER — DIPHENHYDRAMINE HCL 50 MG/ML IJ SOLN
25.0000 mg | Freq: Once | INTRAMUSCULAR | Status: AC
  Administered 2013-05-30: 25 mg via INTRAVENOUS

## 2013-05-30 MED ORDER — CINACALCET HCL 30 MG PO TABS
30.0000 mg | ORAL_TABLET | Freq: Every day | ORAL | Status: DC
  Administered 2013-05-30 – 2013-05-31 (×2): 30 mg via ORAL
  Filled 2013-05-30 (×3): qty 1

## 2013-05-30 MED ORDER — SODIUM CHLORIDE 0.9 % IV SOLN
1020.0000 mg | Freq: Once | INTRAVENOUS | Status: AC
  Administered 2013-05-30: 1020 mg via INTRAVENOUS
  Filled 2013-05-30: qty 34

## 2013-05-30 MED ORDER — CAMPHOR-MENTHOL 0.5-0.5 % EX LOTN
TOPICAL_LOTION | CUTANEOUS | Status: DC | PRN
  Filled 2013-05-30: qty 222

## 2013-05-30 NOTE — Progress Notes (Signed)
Subjective: Interval History: has no complaint of stom pain, much better.  Objective: Vital signs in last 24 hours: Temp:  [98.6 F (37 C)-99.8 F (37.7 C)] 98.8 F (37.1 C) (11/20 0815) Pulse Rate:  [85-114] 98 (11/20 0815) Resp:  [16-20] 18 (11/20 0815) BP: (88-180)/(42-85) 131/56 mmHg (11/20 0815) SpO2:  [93 %-100 %] 95 % (11/20 0815) FiO2 (%):  [28 %] 28 % (11/19 1109) Weight:  [62 kg (136 lb 11 oz)-64.1 kg (141 lb 5 oz)] 62 kg (136 lb 11 oz) (11/19 2322) Weight change: -0.953 kg (-2 lb 1.6 oz)  Intake/Output from previous day: 11/19 0701 - 11/20 0700 In: 285.5 [I.V.:285.5] Out: 1045  Intake/Output this shift:    General appearance: alert, cooperative and no distress Resp: clear to auscultation bilaterally Cardio: S1, S2 normal and systolic murmur: holosystolic 2/6, blowing at apex GI: pos bs, nontender Extremities: AVF LUA  Lab Results:  Recent Labs  05/28/13 2158 05/30/13 0515  WBC 12.1* 6.0  HGB 13.1 11.3*  HCT 38.7 35.4*  PLT 289 248   BMET:  Recent Labs  05/29/13 1814 05/30/13 0515  NA 141 140  K 4.5 3.8  CL 98 96  CO2 22 27  GLUCOSE 99 74  BUN 66* 20  CREATININE 11.75* 6.06*  CALCIUM 8.3* 9.1   No results found for this basename: PTH,  in the last 72 hours Iron Studies:  Recent Labs  05/29/13 1814  IRON 14*  TIBC 194*    Studies/Results: Ct Abdomen Pelvis Wo Contrast  05/29/2013   CLINICAL DATA:  Abdominal pain. Nausea and vomiting. Chronic renal failure  EXAM: CT ABDOMEN AND PELVIS WITHOUT CONTRAST  TECHNIQUE: Multidetector CT imaging of the abdomen and pelvis was performed following the standard protocol without intravenous contrast.  COMPARISON:  CT 09/24/2009  FINDINGS: The lung bases are clear. Heart size is within normal limits. No pleural effusion. Small hiatal hernia.  Liver gallbladder and bile ducts are normal. Pancreas and spleen show no acute abnormality. Markedly atrophic kidneys compatible with chronic renal failure. 2 cm  right upper pole renal lesion unchanged. No hydronephrosis.  Small bowel dilatation. There is a dilated loop of jejunum measuring 32 mm in diameter suggestive of small bowel obstruction. The colon is decompressed. There is barium in the colon with colonic diverticulosis present. No significant colonic thickening.  Negative for free fluid.  Negative for mass or adenopathy.  IMPRESSION: Small bowel dilatation consistent with partial small bowel obstruction.  Colonic diverticulosis.   Electronically Signed   By: Franchot Gallo M.D.   On: 05/29/2013 07:28   Portable Chest 1 View  05/29/2013   CLINICAL DATA:  Cough, abdominal pain  EXAM: PORTABLE CHEST - 1 VIEW  COMPARISON:  02/20/2013.  FINDINGS: The heart size and mediastinal contours are within normal limits. There is evidence of prior CABG. There is left basilar scarring. The lungs are otherwise clear. No pleural effusion or pneumothorax. There are degenerative changes of bilateral acromioclavicular joints.  IMPRESSION: No active disease.   Electronically Signed   By: Kathreen Devoid   On: 05/29/2013 12:11    I have reviewed the patient's current medications.  Assessment/Plan: 1  ESRD  bp dropped yest, needs less meds 2 HTn stop clonidine 3 anemia use Feraheme as rx to other meds 4 SBO resolved 5 CAD P HD feraheme, stop clonidine    LOS: 2 days   Shatoya Roets L 05/30/2013,10:05 AM

## 2013-05-30 NOTE — Progress Notes (Signed)
  Date: 05/30/2013  Patient name: ILEE PASTORA  Medical record number: [id number]  Date of birth: 08-23-1940   This patient has been seen and the plan of care was discussed with the house staff. Please see their note for complete details. I concur with their findings with the following additions/corrections:  Will advance diet as tolerated.  Start oral medications tomorrow if continues to do well.   Sid Falcon, MD 05/30/2013, 3:40 PM

## 2013-05-30 NOTE — Progress Notes (Signed)
Subjective: [name] was seen and examined this morning.  She is feeling well, denies N/V or abdominal pain.  She says she feels hungry and is willing to try clears.  Objective: Vital signs in last 24 hours: Filed Vitals:   05/29/13 2230 05/29/13 2322 05/30/13 0500 05/30/13 0815  BP: 134/55 153/52 122/42 131/56  Pulse: 99 114 98 98  Temp: 98.7 F (37.1 C) 99.4 F (37.4 C) 98.6 F (37 C) 98.8 F (37.1 C)  TempSrc: Oral Oral Oral Oral  Resp: 20 20 18 18   Height:      Weight: 63.1 kg (139 lb 1.8 oz) 62 kg (136 lb 11 oz)    SpO2: 93% 96% 94% 95%   Weight change: -0.953 kg (-2 lb 1.6 oz)  Intake/Output Summary (Last 24 hours) at 05/30/13 1156 Last data filed at 05/29/13 2230  Gross per 24 hour  Intake  285.5 ml  Output   1045 ml  Net -759.5 ml   General: resting in bed in NAD Cardiac: RRR, no rubs, murmurs or gallops Pulm: clear to auscultation bilaterally, moving normal volumes of air Abd: soft, nontender, nondistended, BS present Ext: warm and well perfused, no pedal edema Neuro: alert and oriented X3  Lab Results: Basic Metabolic Panel:  Recent Labs Lab 05/29/13 1224 05/29/13 1814 05/30/13 0515  NA  --  141 140  K  --  4.5 3.8  CL  --  98 96  CO2  --  22 27  GLUCOSE  --  99 74  BUN  --  66* 20  CREATININE  --  11.75* 6.06*  CALCIUM  --  8.3* 9.1  MG 1.7  --   --   PHOS  --  6.0*  --    Liver Function Tests:  Recent Labs Lab 05/28/13 2158 05/29/13 1814  AST 31 18  ALT 15 9  ALKPHOS 126* 98  BILITOT 0.4 0.4  PROT 8.1 6.0  ALBUMIN 4.3 3.2*    Recent Labs Lab 05/28/13 2158  LIPASE 47   CBC:  Recent Labs Lab 05/28/13 2158 05/30/13 0515  WBC 12.1* 6.0  NEUTROABS 10.1*  --   HGB 13.1 11.3*  HCT 38.7 35.4*  MCV 95.8 98.6  PLT 289 248   Cardiac Enzymes:  Recent Labs Lab 05/29/13 1224  TROPONINI 0.40*   CBG:  Recent Labs Lab 05/30/13 0730 05/30/13 1143  GLUCAP 73 82   Anemia Panel:  Recent Labs Lab 05/29/13 1814  TIBC  194*  IRON 14*   Micro Results: Recent Results (from the past 240 hour(s))  MRSA PCR SCREENING     Status: None   Collection Time    05/29/13 11:56 AM      Result Value Range Status   MRSA by PCR NEGATIVE  NEGATIVE Final   Comment:            The GeneXpert MRSA Assay (FDA     approved for NASAL specimens     only), is one component of a     comprehensive MRSA colonization     surveillance program. It is not     intended to diagnose MRSA     infection nor to guide or     monitor treatment for     MRSA infections.   Studies/Results: Ct Abdomen Pelvis Wo Contrast  05/29/2013   CLINICAL DATA:  Abdominal pain. Nausea and vomiting. Chronic renal failure  EXAM: CT ABDOMEN AND PELVIS WITHOUT CONTRAST  TECHNIQUE: Multidetector CT imaging of  the abdomen and pelvis was performed following the standard protocol without intravenous contrast.  COMPARISON:  CT 09/24/2009  FINDINGS: The lung bases are clear. Heart size is within normal limits. No pleural effusion. Small hiatal hernia.  Liver gallbladder and bile ducts are normal. Pancreas and spleen show no acute abnormality. Markedly atrophic kidneys compatible with chronic renal failure. 2 cm right upper pole renal lesion unchanged. No hydronephrosis.  Small bowel dilatation. There is a dilated loop of jejunum measuring 32 mm in diameter suggestive of small bowel obstruction. The colon is decompressed. There is barium in the colon with colonic diverticulosis present. No significant colonic thickening.  Negative for free fluid.  Negative for mass or adenopathy.  IMPRESSION: Small bowel dilatation consistent with partial small bowel obstruction.  Colonic diverticulosis.   Electronically Signed   By: Franchot Gallo M.D.   On: 05/29/2013 07:28   Portable Chest 1 View  05/29/2013   CLINICAL DATA:  Cough, abdominal pain  EXAM: PORTABLE CHEST - 1 VIEW  COMPARISON:  02/20/2013.  FINDINGS: The heart size and mediastinal contours are within normal limits. There  is evidence of prior CABG. There is left basilar scarring. The lungs are otherwise clear. No pleural effusion or pneumothorax. There are degenerative changes of bilateral acromioclavicular joints.  IMPRESSION: No active disease.   Electronically Signed   By: Kathreen Devoid   On: 05/29/2013 12:11   Medications: I have reviewed the patient's current medications. Scheduled Meds: . antiseptic oral rinse  15 mL Mouth Rinse q12n4p  . chlorhexidine  15 mL Mouth Rinse BID  . cinacalcet  30 mg Oral Q supper  . ferumoxytol  1,020 mg Intravenous Once  . lanthanum  1,000 mg Oral TID WC  . multivitamin  1 tablet Oral QHS  . sodium chloride  3 mL Intravenous Q12H  . sodium chloride  3 mL Intravenous Q12H  . tobramycin-dexamethasone  1 drop Left Eye QID   Continuous Infusions:  PRN Meds:.sodium chloride, sodium chloride, diphenhydrAMINE, feeding supplement (NEPRO CARB STEADY), heparin, heparin, hydrALAZINE, lidocaine (PF), lidocaine-prilocaine, morphine injection, pentafluoroprop-tetrafluoroeth, sodium chloride  Assessment/Plan: The patient is a 72 year-old woman with a PMH of multiple abdominal surgeries including appendectomy, total hysterectomy and colon resection, CAD/CHF, s/p CABG in Dec 2013, ESRD MWF, HTN, recent hospitalization for UGIB in September 2014, Who presents to the ED for acute nausea, vomiting and abdominal pain. Abd CT showed evidence of partial small bowel obstruction.   # Small bowel obstruction, partial  Patient presents with acute onset of the nausea, vomiting, abdominal distention and pain x1 day. Physical examination revealed epigastric area tenderness and mild abd distention. And abdomen CT scan confirmed partial small bowel obstruction.  The etiology may associated with her past hx of multiple abdominal surgeries.  She has a history of ovarian cancer and colon cancer about 20 years ago, and states that she is cancer free since 34. She denies B symptoms and no indication of  abnormal tumor/lesion on abd CT, which make cancer relapse less likely.  Her N/V has resolved, will try clears then advance diet as tolerated. - monitor on telemetry - clears this morning, then advance diet as tolerated - NG tube - patient denies N/V at this time; she understands if her symptoms return or worsen she may need NG - IVF discontinued as patient is now eating - pain management as need   # Essential hypertension, benign  Her home meds include Amlodipine, clonidine, hydralazine and PRN labetalol.   - Clonidine d/c  2/2 to soft BPs (per nephrology); holding other anti-hypertensives as well. - Hydralazine PRN ordered   # ESRD on hemodialysis and Anemia of chronic kidney failure  - Appreciate renal consult  - patient will continue to have HD while hospitalized.   # HLD (hyperlipidemia)--hold home meds   # GERD (gastroesophageal reflux disease)--hold PPI   # Hx of CABG--stable   # VTE: SCD given recent hospitalization for UGIB with multiple blood transfusion.    Dispo: Disposition is deferred at this time, awaiting improvement of current medical problems.  Anticipated discharge in approximately 1-2 day(s).   The patient does have a current PCP [name], MD) and does need an [facility] hospital follow-up appointment after discharge.  The patient does not know have transportation limitations that hinder transportation to clinic appointments.  .Services Needed at time of discharge: Y = Yes, Blank = No PT:   OT:   RN:   Equipment:   Other:     LOS: 2 days   Duwaine Maxin, DO 05/30/2013, 11:56 AM

## 2013-05-31 ENCOUNTER — Inpatient Hospital Stay (HOSPITAL_COMMUNITY): Payer: PRIVATE HEALTH INSURANCE

## 2013-05-31 LAB — RENAL FUNCTION PANEL
Albumin: 3.4 g/dL — ABNORMAL LOW (ref 3.5–5.2)
CO2: 24 mEq/L (ref 19–32)
Calcium: 8.4 mg/dL (ref 8.4–10.5)
Chloride: 98 mEq/L (ref 96–112)
GFR calc Af Amer: 4 mL/min — ABNORMAL LOW (ref >90)
GFR calc non Af Amer: 4 mL/min — ABNORMAL LOW (ref >90)
Glucose, Bld: 79 mg/dL (ref 70–99)
Phosphorus: 6.1 mg/dL — ABNORMAL HIGH (ref 2.3–4.6)
Sodium: 140 mEq/L (ref 135–145)

## 2013-05-31 LAB — CBC
Hemoglobin: 10.7 g/dL — ABNORMAL LOW (ref 12.0–15.0)
MCH: 32 pg (ref 26.0–34.0)
Platelets: 242 10*3/uL (ref 150–400)
RBC: 3.34 MIL/uL — ABNORMAL LOW (ref 3.87–5.11)
RDW: 18.1 % — ABNORMAL HIGH (ref 11.5–15.5)
WBC: 5.4 10*3/uL (ref 4.0–10.5)

## 2013-05-31 LAB — GLUCOSE, CAPILLARY
Glucose-Capillary: 123 mg/dL — ABNORMAL HIGH (ref 70–99)
Glucose-Capillary: 126 mg/dL — ABNORMAL HIGH (ref 70–99)

## 2013-05-31 IMAGING — CR DG CHEST 1V PORT
1 series · 1 of 1 positions shown · non-contrast
Comparison: [DATE]

CLINICAL DATA: Nausea, tachycardia

EXAM:
PORTABLE CHEST - 1 VIEW

[AP]
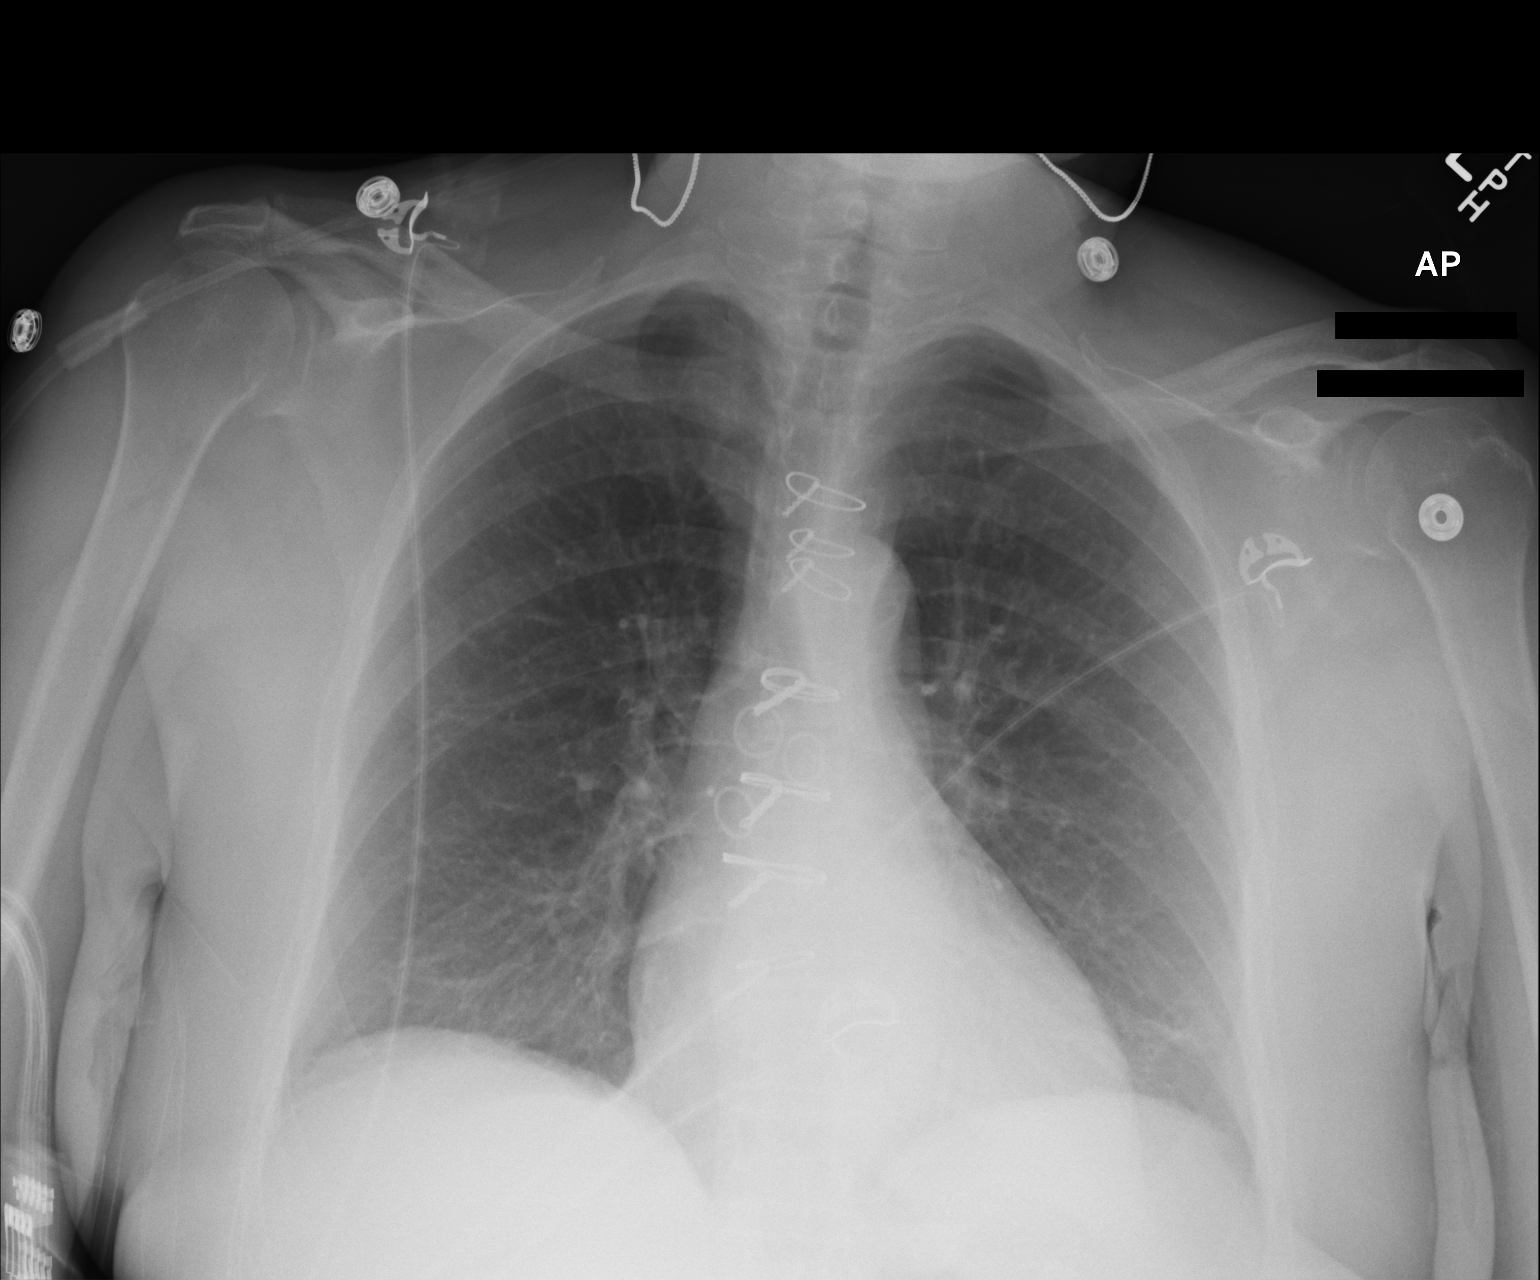

[1 of 1 positions shown; findings below may reference images not displayed]

FINDINGS: The heart size and mediastinal contours are stable. Patient status
post prior median sternotomy. Both lungs are clear. The visualized
skeletal structures are stable.
IMPRESSION: No active cardiopulmonary disease.

## 2013-05-31 MED ORDER — PENTAFLUOROPROP-TETRAFLUOROETH EX AERO: 1.0000 "application " | INHALATION_SPRAY | CUTANEOUS | Status: DC | PRN

## 2013-05-31 MED ORDER — ALTEPLASE 2 MG IJ SOLR
2.0000 mg | Freq: Once | INTRAMUSCULAR | Status: DC | PRN
  Filled 2013-05-31: qty 2

## 2013-05-31 MED ORDER — LIDOCAINE HCL (PF) 1 % IJ SOLN: 5.0000 mL | INTRAMUSCULAR | Status: DC | PRN

## 2013-05-31 MED ORDER — SODIUM CHLORIDE 0.9 % IV SOLN: 100.0000 mL | INTRAVENOUS | Status: DC | PRN

## 2013-05-31 MED ORDER — MORPHINE SULFATE 2 MG/ML IJ SOLN
0.5000 mg | Freq: Once | INTRAMUSCULAR | Status: AC
  Administered 2013-05-31: 0.5 mg via INTRAVENOUS

## 2013-05-31 MED ORDER — NEPRO/CARBSTEADY PO LIQD: 237.0000 mL | ORAL | Status: DC | PRN

## 2013-05-31 MED ORDER — LIDOCAINE-PRILOCAINE 2.5-2.5 % EX CREA: 1.0000 "application " | TOPICAL_CREAM | CUTANEOUS | Status: DC | PRN

## 2013-05-31 MED ORDER — PROMETHAZINE HCL 25 MG/ML IJ SOLN
12.5000 mg | Freq: Once | INTRAMUSCULAR | Status: DC
  Filled 2013-05-31: qty 1

## 2013-05-31 MED ORDER — HEPARIN SODIUM (PORCINE) 1000 UNIT/ML DIALYSIS
100.0000 [IU]/kg | INTRAMUSCULAR | Status: DC | PRN
  Administered 2013-05-31: 6200 [IU] via INTRAVENOUS_CENTRAL
  Filled 2013-05-31: qty 7

## 2013-05-31 MED ORDER — HEPARIN SODIUM (PORCINE) 1000 UNIT/ML DIALYSIS: 1000.0000 [IU] | INTRAMUSCULAR | Status: DC | PRN

## 2013-05-31 MED ORDER — DIPHENHYDRAMINE HCL 50 MG/ML IJ SOLN
INTRAMUSCULAR | Status: AC
  Filled 2013-05-31: qty 1

## 2013-05-31 MED ORDER — MORPHINE SULFATE 2 MG/ML IJ SOLN
INTRAMUSCULAR | Status: AC
  Filled 2013-05-31: qty 1

## 2013-05-31 MED ORDER — ACETAMINOPHEN 325 MG PO TABS: 325.0000 mg | ORAL_TABLET | Freq: Once | ORAL | Status: DC

## 2013-05-31 NOTE — Progress Notes (Signed)
Internal Medicine Night Float Progress note  S: Called by nursing. Patient tachycardic to 117 on telemetry. Started complaining of back pain and feeling overall unwell.  Went to see patient.  She reports she had some back pain between her shoulder but that has now resolved.  She reports she has not felt that well since returning from HD earlier today.  She denies chest pain, changes in vision.  She did report a mild HA that was easing up.  She admits some mild crampy abdominal pain.  O: BP 135-120/80-70 R arm (patient has AVF in left arm).  HR 95-105 O2 Sat 92% on RA but improved with deep inspiration, O2 sat 96% on 3L [place].  Sinus tachycardia on monitor. Gen: NAD, anxious Cardiac: Tachycardia Pulm: CTA b/l Abd: Soft, + BS, diffuse mild tenderness to palpation. Neuro: Alert, awake, Oriented and responding appropriately.  A: 72 year old female with ESRD noted to be orthostatic hypotensive s/p HD earlier today with c/o mild abdominal cramping, back pain, and junctional tachycardia on telemetry- now in sinus tach with HR in low 100s.  P: -EKG obtained and reviewed -Sinus tachycardia, normal axis, no ST elevation, TWI in lead I, avL, V6, prolonged QTc at 502. <ECGINTERP> - Portable CXR and Abd Xray,  - STAT trop, BMP, Lactic acid -Continuous pulse ox monitoring, supplemental O2 to maintain >92%. - Phenergan 12.5mg  for nausea - Morphine 0.5mg  for HA. - 250cc NS bolus (Spoke with Dr. Deterding-Nephrology)  Joni Reining, DO 11:23 PM

## 2013-05-31 NOTE — Procedures (Signed)
I was present at this session.  I have reviewed the session itself and made appropriate changes.  bp dropped, but clonidine patch still on. Removed.  Discussed wgt, meds.  Tomoya Ringwald L 11/21/20149:23 AM

## 2013-05-31 NOTE — Progress Notes (Signed)
Pt had a complaint of nausea last evening  Prior to eating dinner. Nausea resolved within few minutes of drinking ginger ale and patient was able to tolerate her dinner eating 80 percent. Pt had no complains of pain or nausea after eating dinner or  throughout the night.     Eldridge Abrahams BSN, RN-BC

## 2013-05-31 NOTE — Progress Notes (Signed)
Subjective: Interval History: has no complaint of of N, V still sore LLQ.  Objective: Vital signs in last 24 hours: Temp:  [98.2 F (36.8 C)-99.9 F (37.7 C)] 98.2 F (36.8 C) (11/21 0719) Pulse Rate:  [75-103] 75 (11/21 0900) Resp:  [16-18] 18 (11/21 0900) BP: (106-172)/(53-75) 142/59 mmHg (11/21 0900) SpO2:  [94 %-97 %] 94 % (11/21 0719) Weight:  [61.3 kg (135 lb 2.3 oz)-63.594 kg (140 lb 3.2 oz)] 61.3 kg (135 lb 2.3 oz) (11/21 0719) Weight change: 0.091 kg (3.2 oz)  Intake/Output from previous day: 11/20 0701 - 11/21 0700 In: 480 [P.O.:480] Out: 0  Intake/Output this shift:    General appearance: alert and cooperative Resp: clear to auscultation bilaterally Cardio: S1, S2 normal and systolic murmur: holosystolic 2/6, blowing at apex GI: soft, non-tender; bowel sounds normal; no masses,  no organomegaly Extremities: avf LUA ABDM pos bs, liver down 3 cm. Tender LLQ  Lab Results:  Recent Labs  05/30/13 0515 05/31/13 0645  WBC 6.0 5.4  HGB 11.3* 10.7*  HCT 35.4* 32.6*  PLT 248 242   BMET:  Recent Labs  05/30/13 0515 05/31/13 0645  NA 140 140  K 3.8 4.3  CL 96 98  CO2 27 24  GLUCOSE 74 79  BUN 20 38*  CREATININE 6.06* 9.20*  CALCIUM 9.1 8.4    Recent Labs  05/29/13 1814  PTH 709.7*   Iron Studies:  Recent Labs  05/29/13 1814  IRON 14*  TIBC 194*    Studies/Results: Portable Chest 1 View  05/29/2013   CLINICAL DATA:  Cough, abdominal pain  EXAM: PORTABLE CHEST - 1 VIEW  COMPARISON:  02/20/2013.  FINDINGS: The heart size and mediastinal contours are within normal limits. There is evidence of prior CABG. There is left basilar scarring. The lungs are otherwise clear. No pleural effusion or pneumothorax. There are degenerative changes of bilateral acromioclavicular joints.  IMPRESSION: No active disease.   Electronically Signed   By: Kathreen Devoid   On: 05/29/2013 12:11    I have reviewed the patient's current medications.  Assessment/Plan: 1   ESRD for HD.  Clonidine patch not removed as ordered, will limit vol off 2 Anemia stable , got Fe 3 SBO  Better, etio of LLQ pain ??? 4 HPTH resume meds. P HD, epo, sensipar counsel staff    LOS: 3 days   Ayeden Gladman L 05/31/2013,9:24 AM

## 2013-05-31 NOTE — Progress Notes (Signed)
When ambulating room, pt stated that she felt dizzy, lightheaded, and weak.  Orthostatic hypotension set taken:  Lying: 84/42 Sitting: 107/52 Standing: 105/57  Patient tolerated well.  MD notified.  Patient educated on safety and fall precautions.  Discharge order moved from today to tomorrow.  Call bell within reach, and red socks on.  Continue to assess.

## 2013-05-31 NOTE — Discharge Summary (Signed)
Patient Name:  Shawna [name]  MRN: [id number]  PCP: [name], MD  DOB:  Sep 28, 1940       Date of Admission:  05/28/2013  Date of Discharge:  06/01/2013      Attending Physician: Dr. Sid Falcon, MD         DISCHARGE DIAGNOSES: Active Problems:   Essential hypertension, benign   ESRD on hemodialysis   HLD (hyperlipidemia)   GERD (gastroesophageal reflux disease)   Anemia of chronic kidney failure   Hx of CABG   Small bowel obstruction, partial 1.    DISPOSITION AND FOLLOW-UP: Shawna [name] is to follow-up with the listed providers as detailed below, at patient's visiting, please address following issues:  1) Patient with soft BPs.  BP medication held at d/c.  Only clonidine continued and titrate off.  Please assess need for addition of other BP medications at HD on Monday.  Follow-up Information   Follow up with [name], MD In 3 days.   Specialty:  Family Medicine   Contact information:   [address] [place] [address] [phone]          Discharge Orders   Future Appointments Provider Department Dept Phone   07/02/2013 1:00 PM [facility], [place] [phone]   08/08/2013 12:30 PM [facility] [place] [facility] [address] [phone]   08/08/2013 1:30 PM [facility] [address] [phone]   08/08/2013 2:00 PM Elam Dutch, MD [facility] [phone]   Future Orders Complete By Expires   (HEART FAILURE PATIENTS) Call MD:  Anytime you have any of the following symptoms: 1) 3 pound weight gain in 24 hours or 5 pounds in 1 week 2) shortness of breath, with or without a dry hacking cough 3) swelling in the hands, feet or stomach 4) if you have to sleep on extra pillows at night in order to breathe.  As directed    Call MD for:  difficulty breathing, headache or visual disturbances  As directed    Call MD for:  extreme fatigue  As directed    Call MD  for:  persistant dizziness or light-headedness  As directed    Call MD for:  persistant nausea and vomiting  As directed    Call MD for:  severe uncontrolled pain  As directed    Call MD for:  temperature >100.4  As directed    Diet - low sodium heart healthy  As directed    Increase activity slowly  As directed        DISCHARGE MEDICATIONS:   Medication List    STOP taking these medications       amLODipine 10 MG tablet  Commonly known as:  NORVASC     hydrALAZINE 25 MG tablet  Commonly known as:  APRESOLINE     labetalol 200 MG tablet  Commonly known as:  NORMODYNE      TAKE these medications       albuterol 108 (90 BASE) MCG/ACT inhaler  Commonly known as:  PROVENTIL HFA;VENTOLIN HFA  Inhale 2 puffs into the lungs every 6 (six) hours as needed for wheezing or shortness of breath.     ALPRAZolam 0.5 MG tablet  Commonly known as:  XANAX  Take 0.5 mg by mouth 3 (three) times daily.     aspirin 81 MG chewable tablet  Chew 1 tablet (81 mg total) by mouth daily.  cloNIDine 0.3 MG tablet  Commonly known as:  CATAPRES  Take 0.3 mg by mouth daily.     fluticasone 50 MCG/ACT nasal spray  Commonly known as:  FLONASE  Place 2 sprays into the nose daily.     folic acid 1 MG tablet  Commonly known as:  FOLVITE  Take 1 tablet (1 mg total) by mouth daily.     folic acid-vitamin b complex-vitamin c-selenium-zinc 3 MG Tabs tablet  Take 1 tablet by mouth daily.     hydrOXYzine 25 MG tablet  Commonly known as:  ATARAX/VISTARIL  Take 25 mg by mouth daily as needed for itching.     iron polysaccharides 150 MG capsule  Commonly known as:  NIFEREX  Take 150 mg by mouth daily.     lanthanum 1000 MG chewable tablet  Commonly known as:  FOSRENOL  Chew 1,000 mg by mouth 3 (three) times daily after meals.     lidocaine-prilocaine cream  Commonly known as:  EMLA  Apply 1 application topically every Monday, Wednesday, and Friday.     LIPITOR 20 MG tablet  Generic drug:   atorvastatin  TAKE 1 BY MOUTH DAILY AT   6PM *CHRISTOPHER BERGE,NP*     loratadine 10 MG tablet  Commonly known as:  CLARITIN  Take 10 mg by mouth daily.     nitroGLYCERIN 0.4 MG SL tablet  Commonly known as:  NITROSTAT  Place 0.4 mg under the tongue every 5 (five) minutes as needed for chest pain.     omeprazole 40 MG capsule  Commonly known as:  PRILOSEC  Take 1 capsule (40 mg total) by mouth daily.     polyethylene glycol packet  Commonly known as:  MIRALAX / GLYCOLAX  Take 17 g by mouth daily as needed for mild constipation.     SENSIPAR 30 MG tablet  Generic drug:  cinacalcet  Take 30 mg by mouth daily.     tobramycin-dexamethasone ophthalmic solution  Commonly known as:  TOBRADEX  Place 1 drop into the left eye 4 (four) times daily.     traMADol 50 MG tablet  Commonly known as:  ULTRAM  Take 50 mg by mouth every 6 (six) hours as needed for pain.     zolpidem 10 MG tablet  Commonly known as:  AMBIEN         CONSULTS:  Treatment Team:  Placido Sou, MD    PROCEDURES PERFORMED:  Ct Abdomen Pelvis Wo Contrast  05/29/2013   CLINICAL DATA:  Abdominal pain. Nausea and vomiting. Chronic renal failure  EXAM: CT ABDOMEN AND PELVIS WITHOUT CONTRAST  TECHNIQUE: Multidetector CT imaging of the abdomen and pelvis was performed following the standard protocol without intravenous contrast.  COMPARISON:  CT 09/24/2009  FINDINGS: The lung bases are clear. Heart size is within normal limits. No pleural effusion. Small hiatal hernia.  Liver gallbladder and bile ducts are normal. Pancreas and spleen show no acute abnormality. Markedly atrophic kidneys compatible with chronic renal failure. 2 cm right upper pole renal lesion unchanged. No hydronephrosis.  Small bowel dilatation. There is a dilated loop of jejunum measuring 32 mm in diameter suggestive of small bowel obstruction. The colon is decompressed. There is barium in the colon with colonic diverticulosis present. No  significant colonic thickening.  Negative for free fluid.  Negative for mass or adenopathy.  IMPRESSION: Small bowel dilatation consistent with partial small bowel obstruction.  Colonic diverticulosis.   Electronically Signed   By: Franchot Gallo M.D.  On: 05/29/2013 07:28   Portable Chest 1 View  05/29/2013   CLINICAL DATA:  Cough, abdominal pain  EXAM: PORTABLE CHEST - 1 VIEW  COMPARISON:  02/20/2013.  FINDINGS: The heart size and mediastinal contours are within normal limits. There is evidence of prior CABG. There is left basilar scarring. The lungs are otherwise clear. No pleural effusion or pneumothorax. There are degenerative changes of bilateral acromioclavicular joints.  IMPRESSION: No active disease.   Electronically Signed   By: Kathreen Devoid   On: 05/29/2013 12:11       ADMISSION DATA: H&P:The patient is a 72 year-old woman with a PMH of multiple abdominal surgeries including appendectomy, total hysterectomy and colon resection, CAD/CHF, s/p CABG in Dec 2013, ESRD MWF, HTN, recent hospitalization for UGIB in September 2014, Who presents to the ED for acute nausea, vomiting and abdominal pain.  Patient states that she had sudden onset severe abdominal pain with associated nausea and vomiting brown stomach content about 7 PM after dinner last night. She states that she only ate her usual dinner including some vegetables and apple sauce. Her abdomen pain was located at epigastric area, intermittent, sharp, 10/10 and radiating to her back. Her She had temporary relief of pain following emesis. Denies bloody emesis. She denies similar pain or problems in the past. Denies fever, chills, rhinorrhea, chest pain/pressure, palpitation, shortness breath, cough, diarrhea or constipation. She was brought to the ED for evaluation and a abdominal CT scan showed evidence of partial small bowel obstruction. The IMTS is called for admission.    Physical Exam: Blood pressure 180/69, pulse 85, temperature 98.8  F (37.1 C), temperature source Oral, resp. rate 20, weight 142 lb 1.6 oz (64.456 kg), SpO2 100.00%.  General: mild distress due to abdominal pain.  Head: normocephalic and atraumatic.  Eyes: vision grossly intact, pupils equal, pupils round, pupils reactive to light, no injection and anicteric.  Mouth: pharynx pink and moist, no erythema, and no exudates.  Neck: supple, full ROM, no thyromegaly, no JVD, and no carotid bruits.  Lungs: normal respiratory effort, no accessory muscle use, normal breath sounds, no crackles, and no wheezes. Heart: normal rate, regular rhythm, no murmur, no gallop, and no rub.  Abdomen: soft, mild distended, mild-tender to epigastric area, no guarding, no rebound tenderness, no hepatomegaly, and no splenomegaly. Hyperactive bowel sounds at LUQ and RUQ. Hypoactive BS at RLQ and LLQ.  Msk: no joint swelling, no joint warmth, and no redness over joints.  Pulses: 1+ DP/PT pulses bilaterally Extremities: No cyanosis, clubbing, edema. Left arm AV fistula with bruit.  Neurologic: alert & oriented X3, cranial nerves II-XII intact, strength normal in all extremities, sensation intact to light touch, and gait normal.  Skin: turgor normal and no rashes.  Psych: Oriented X3, memory intact for recent and remote, normally interactive, good eye contact, not anxious appearing, and not depressed appearing.  HOSPITAL COURSE: # Hypotension and tachycardia: pt appears to have been dehydrated and had a clonidine patch that was not properly removed and in setting of decreased po intake over past few days may not have been able to tolerate normal HD session. Fully resolved in AM. Cardiology consulted and indicate that all reflective of pain and dehydration and trops negative, no new ekg changes this AM and all labs normal along with imaging studying. Therefore all other bp meds held and pt slowly being weaned off clonidine to prevent rebound HTN. Decreased from 0.3mg  to 0.2 and continue  titration outpt.   #Small bowel  obstruction, partial Patient presents with acute onset of the nausea, vomiting, abdominal distention and pain x1 day. Physical examination revealed epigastric area tenderness and mild abd distention. And abdomen CT scan confirmed partial small bowel obstruction.  The etiology may associated with her past hx of multiple abdominal surgeries.  She has a history of ovarian cancer and colon cancer about 20 years ago, and states that she is cancer free since 13. She denies B symptoms and no indication of abnormal tumor/lesion on abd CT, which make cancer relapse less likely. Her N/V has resolved and she is tolerating a full diet.   # Essential hypertension, benign  Her home meds include Amlodipine, clonidine, hydralazine and PRN labetalol which were all d/c and intention was to d/c Clonidine (per nephrology), however the patch was never removed and remained soft. Upon d/c only clonidine was being titrated down to prevent rebound HTN.   DISCHARGE DATA: Vital Signs: BP 131/55  Pulse 98  Temp(Src) 98 F (36.7 C) (Oral)  Resp 20  Ht 5\' 1"  (1.549 m)  Wt 130 lb 11.7 oz (59.3 kg)  BMI 24.71 kg/m2  SpO2 100%  Labs: Results for orders placed during the hospital encounter of 05/28/13 (from the past 24 hour(s))  GLUCOSE, CAPILLARY     Status: Abnormal   Collection Time    05/31/13  4:09 PM      Result Value Range   Glucose-Capillary 123 (*) 70 - 99 mg/dL  GLUCOSE, CAPILLARY     Status: Abnormal   Collection Time    05/31/13  9:10 PM      Result Value Range   Glucose-Capillary 126 (*) 70 - 99 mg/dL   Comment 1 Documented in Chart     Comment 2 Notify RN    GLUCOSE, CAPILLARY     Status: Abnormal   Collection Time    05/31/13 10:49 PM      Result Value Range   Glucose-Capillary 107 (*) 70 - 99 mg/dL   Comment 1 Documented in Chart     Comment 2 Notify RN    BASIC METABOLIC PANEL     Status: Abnormal   Collection Time    06/01/13 12:04 AM      Result Value  Range   Sodium 134 (*) 135 - 145 mEq/L   Potassium 4.0  3.5 - 5.1 mEq/L   Chloride 89 (*) 96 - 112 mEq/L   CO2 30  19 - 32 mEq/L   Glucose, Bld 106 (*) 70 - 99 mg/dL   BUN 24 (*) 6 - 23 mg/dL   Creatinine, Ser 5.63 (*) 0.50 - 1.10 mg/dL   Calcium 9.4  8.4 - 10.5 mg/dL   GFR calc non Af Amer 7 (*) >90 mL/min   GFR calc Af Amer 8 (*) >90 mL/min  LACTIC ACID, PLASMA     Status: None   Collection Time    06/01/13 12:04 AM      Result Value Range   Lactic Acid, Venous 1.1  0.5 - 2.2 mmol/L  TROPONIN I     Status: None   Collection Time    06/01/13 12:57 AM      Result Value Range   Troponin I <0.30  <0.30 ng/mL  CBC     Status: Abnormal   Collection Time    06/01/13  3:48 AM      Result Value Range   WBC 7.9  4.0 - 10.5 K/uL   RBC 3.89  3.87 - 5.11 MIL/uL  Hemoglobin 12.7  12.0 - 15.0 g/dL   HCT 38.7  36.0 - 46.0 %   MCV 99.5  78.0 - 100.0 fL   MCH 32.6  26.0 - 34.0 pg   MCHC 32.8  30.0 - 36.0 g/dL   RDW 17.9 (*) 11.5 - 15.5 %   Platelets 302  150 - 400 K/uL  RENAL FUNCTION PANEL     Status: Abnormal   Collection Time    06/01/13  3:48 AM      Result Value Range   Sodium 136  135 - 145 mEq/L   Potassium 4.0  3.5 - 5.1 mEq/L   Chloride 89 (*) 96 - 112 mEq/L   CO2 29  19 - 32 mEq/L   Glucose, Bld 119 (*) 70 - 99 mg/dL   BUN 26 (*) 6 - 23 mg/dL   Creatinine, Ser 6.42 (*) 0.50 - 1.10 mg/dL   Calcium 9.8  8.4 - 10.5 mg/dL   Phosphorus 4.6  2.3 - 4.6 mg/dL   Albumin 4.0  3.5 - 5.2 g/dL   GFR calc non Af Amer 6 (*) >90 mL/min   GFR calc Af Amer 7 (*) >90 mL/min  GLUCOSE, CAPILLARY     Status: None   Collection Time    06/01/13  8:02 AM      Result Value Range   Glucose-Capillary 88  70 - 99 mg/dL   Comment 1 Notify RN     Comment 2 Documented in Chart       Services Ordered on Discharge: Y = Yes; Blank = No PT:   OT:   RN:   Equipment:   Other:      Time Spent on Discharge: 35 min   Signed: Clinton Gallant PGY 2, Internal Medicine Resident 06/01/2013, 11:38  AM

## 2013-05-31 NOTE — Progress Notes (Signed)
Subjective: Shawna [name] was seen and examined this morning on HD.  She says she tolerated dinner last night and has not had N/V or abdominal pain.  She has not had a BM in two days but says that is not unusual and she is passing gas.    Objective: Vital signs in last 24 hours: Filed Vitals:   05/31/13 1039 05/31/13 1100 05/31/13 1126 05/31/13 1130  BP: 119/56 102/46 91/44 111/50  Pulse: 64 84 76 75  Temp:      TempSrc:      Resp: 18 20 18 18   Height:      Weight:      SpO2:       Weight change: 0.091 kg (3.2 oz)  Intake/Output Summary (Last 24 hours) at 05/31/13 1151 Last data filed at 05/30/13 1800  Gross per 24 hour  Intake    360 ml  Output      0 ml  Net    360 ml   General: resting in bed in NAD Cardiac: RRR, no rubs, murmurs or gallops Pulm: clear to auscultation bilaterally, moving normal volumes of air Abd: soft, nondistended, BS present, nontender but says it feels "sore" Ext: warm and well perfused, no pedal edema Neuro: alert and oriented X3  Lab Results: Basic Metabolic Panel:  Recent Labs Lab 05/29/13 1224 05/29/13 1814 05/30/13 0515 05/31/13 0645  NA  --  141 140 140  K  --  4.5 3.8 4.3  CL  --  98 96 98  CO2  --  22 27 24   GLUCOSE  --  99 74 79  BUN  --  66* 20 38*  CREATININE  --  11.75* 6.06* 9.20*  CALCIUM  --  8.3*  8.1* 9.1 8.4  MG 1.7  --   --   --   PHOS  --  6.0*  --  6.1*   Liver Function Tests:  Recent Labs Lab 05/28/13 2158 05/29/13 1814 05/31/13 0645  AST 31 18  --   ALT 15 9  --   ALKPHOS 126* 98  --   BILITOT 0.4 0.4  --   PROT 8.1 6.0  --   ALBUMIN 4.3 3.2* 3.4*    Recent Labs Lab 05/28/13 2158  LIPASE 47   CBC:  Recent Labs Lab 05/28/13 2158 05/30/13 0515 05/31/13 0645  WBC 12.1* 6.0 5.4  NEUTROABS 10.1*  --   --   HGB 13.1 11.3* 10.7*  HCT 38.7 35.4* 32.6*  MCV 95.8 98.6 97.6  PLT 289 248 242   Cardiac Enzymes:  Recent Labs Lab 05/29/13 1224  TROPONINI 0.40*   CBG:  Recent Labs Lab  05/30/13 0730 05/30/13 1143 05/30/13 1652 05/30/13 2028 05/30/13 2353 05/31/13 0409  GLUCAP 73 82 96 87 113* 114*   Anemia Panel:  Recent Labs Lab 05/29/13 1814  TIBC 194*  IRON 14*   Micro Results: Recent Results (from the past 240 hour(s))  MRSA PCR SCREENING     Status: None   Collection Time    05/29/13 11:56 AM      Result Value Range Status   MRSA by PCR NEGATIVE  NEGATIVE Final   Comment:            The GeneXpert MRSA Assay (FDA     approved for NASAL specimens     only), is one component of a     comprehensive MRSA colonization     surveillance program. It is not  intended to diagnose MRSA     infection nor to guide or     monitor treatment for     MRSA infections.   Studies/Results: Portable Chest 1 View  05/29/2013   CLINICAL DATA:  Cough, abdominal pain  EXAM: PORTABLE CHEST - 1 VIEW  COMPARISON:  02/20/2013.  FINDINGS: The heart size and mediastinal contours are within normal limits. There is evidence of prior CABG. There is left basilar scarring. The lungs are otherwise clear. No pleural effusion or pneumothorax. There are degenerative changes of bilateral acromioclavicular joints.  IMPRESSION: No active disease.   Electronically Signed   By: Kathreen Devoid   On: 05/29/2013 12:11   Medications: I have reviewed the patient's current medications. Scheduled Meds: . antiseptic oral rinse  15 mL Mouth Rinse q12n4p  . chlorhexidine  15 mL Mouth Rinse BID  . cinacalcet  30 mg Oral Q supper  . diphenhydrAMINE      . lanthanum  1,000 mg Oral TID WC  . multivitamin  1 tablet Oral QHS  . sodium chloride  3 mL Intravenous Q12H  . sodium chloride  3 mL Intravenous Q12H  . tobramycin-dexamethasone  1 drop Left Eye QID   Continuous Infusions:  PRN Meds:.sodium chloride, sodium chloride, alteplase, camphor-menthol, diphenhydrAMINE, feeding supplement (NEPRO CARB STEADY), heparin, heparin, hydrALAZINE, lidocaine (PF), lidocaine-prilocaine, morphine injection,  pentafluoroprop-tetrafluoroeth, promethazine, sodium chloride  Assessment/Plan: The patient is a 72 year-old woman with a PMH of multiple abdominal surgeries including appendectomy, total hysterectomy and colon resection, CAD/CHF, s/p CABG in Dec 2013, ESRD MWF, HTN, recent hospitalization for UGIB in September 2014, Who presents to the ED for acute nausea, vomiting and abdominal pain. Abd CT showed evidence of partial small bowel obstruction.   # Small bowel obstruction, partial  Patient presents with acute onset of the nausea, vomiting, abdominal distention and pain x1 day. Physical examination revealed epigastric area tenderness and mild abd distention. And abdomen CT scan confirmed partial small bowel obstruction.  The etiology may associated with her past hx of multiple abdominal surgeries.  She has a history of ovarian cancer and colon cancer about 20 years ago, and states that she is cancer free since 64. She denies B symptoms and no indication of abnormal tumor/lesion on abd CT, which make cancer relapse less likely.  Her N/V has resolved and she is tolerating a full diet.   - will plan for discharge today after HD with close hospital follow-up  # Essential hypertension, benign  Her home meds include Amlodipine, clonidine, hydralazine and PRN labetalol.   - Clonidine was to be d/c 2/2 to soft BPs (per nephrology), however the patch was never removed   - BPs still soft, will continue to hold BP meds at discharge, patient will have HD on 06/03/13 and BP meds can be resumed at that time  # ESRD on hemodialysis and Anemia of chronic kidney failure  - HD completed during admission  # HLD (hyperlipidemia)-- resume meds at discharge   # GERD (gastroesophageal reflux disease)- resume PPI at discharge  # Hx of CABG--stable   # VTE: SCD given recent hospitalization for UGIB with multiple blood transfusion.    Dispo: The patient is feeling well, denies N/V and has tolerated full diet.  She  is stable for discharge this afternoon after HD.  The patient does have a current PCP [name], MD) and does need an [facility] hospital follow-up appointment after discharge.  The patient does not know have transportation limitations that hinder  transportation to clinic appointments.  .Services Needed at time of discharge: Y = Yes, Blank = No PT:   OT:   RN:   Equipment:   Other:     LOS: 3 days   Duwaine Maxin, DO 05/31/2013, 11:51 AM

## 2013-06-01 ENCOUNTER — Encounter (HOSPITAL_COMMUNITY): Payer: Self-pay | Admitting: Cardiology

## 2013-06-01 DIAGNOSIS — R000 Tachycardia, unspecified: Secondary | ICD-10-CM

## 2013-06-01 DIAGNOSIS — I959 Hypotension, unspecified: Secondary | ICD-10-CM

## 2013-06-01 LAB — RENAL FUNCTION PANEL
BUN: 26 mg/dL — ABNORMAL HIGH (ref 6–23)
CO2: 29 mEq/L (ref 19–32)
Chloride: 89 mEq/L — ABNORMAL LOW (ref 96–112)
GFR calc Af Amer: 7 mL/min — ABNORMAL LOW (ref >90)
GFR calc non Af Amer: 6 mL/min — ABNORMAL LOW (ref >90)
Glucose, Bld: 119 mg/dL — ABNORMAL HIGH (ref 70–99)
Phosphorus: 4.6 mg/dL (ref 2.3–4.6)
Potassium: 4 mEq/L (ref 3.5–5.1)
Sodium: 136 mEq/L (ref 135–145)

## 2013-06-01 LAB — GLUCOSE, CAPILLARY

## 2013-06-01 LAB — CBC
HCT: 38.7 % (ref 36.0–46.0)
Hemoglobin: 12.7 g/dL (ref 12.0–15.0)
Platelets: 302 10*3/uL (ref 150–400)
RBC: 3.89 MIL/uL (ref 3.87–5.11)
RDW: 17.9 % — ABNORMAL HIGH (ref 11.5–15.5)

## 2013-06-01 LAB — BASIC METABOLIC PANEL
BUN: 24 mg/dL — ABNORMAL HIGH (ref 6–23)
CO2: 30 mEq/L (ref 19–32)
Calcium: 9.4 mg/dL (ref 8.4–10.5)
Creatinine, Ser: 5.63 mg/dL — ABNORMAL HIGH (ref 0.50–1.10)
GFR calc non Af Amer: 7 mL/min — ABNORMAL LOW (ref >90)
Glucose, Bld: 106 mg/dL — ABNORMAL HIGH (ref 70–99)
Potassium: 4 mEq/L (ref 3.5–5.1)

## 2013-06-01 MED ORDER — SIMETHICONE 80 MG PO CHEW
80.0000 mg | CHEWABLE_TABLET | Freq: Once | ORAL | Status: AC
  Administered 2013-06-01: 80 mg via ORAL
  Filled 2013-06-01: qty 1

## 2013-06-01 MED ORDER — SODIUM CHLORIDE 0.9 % IV BOLUS (SEPSIS)
250.0000 mL | Freq: Once | INTRAVENOUS | Status: AC
  Administered 2013-06-01: 250 mL via INTRAVENOUS

## 2013-06-01 MED ORDER — CLONIDINE HCL 0.2 MG PO TABS: 0.2000 mg | ORAL_TABLET | Freq: Every day | ORAL | Status: DC

## 2013-06-01 NOTE — Progress Notes (Signed)
Subjective: Interval History: has complaintbp dropped when had med in system yest.  Wants to know new dry .  Objective: Vital signs in last 24 hours: Temp:  [97.8 F (36.6 C)-99.1 F (37.3 C)] 98 F (36.7 C) (11/22 0859) Pulse Rate:  [83-117] 98 (11/22 0859) Resp:  [16-20] 20 (11/22 0859) BP: (84-145)/(42-78) 131/55 mmHg (11/22 0859) SpO2:  [93 %-100 %] 100 % (11/22 0859) Weight change: -2.294 kg (-5 lb 0.9 oz)  Intake/Output from previous day: 11/21 0701 - 11/22 0700 In: 850 [P.O.:600; IV Piggyback:250] Out: 2100  Intake/Output this shift: Total I/O In: 240 [P.O.:240] Out: 0   General appearance: alert, cooperative and no distress Resp: clear to auscultation bilaterally Cardio: S1, S2 normal and systolic murmur: systolic ejection 2/6, decrescendo at 2nd left intercostal space GI: pos bs, nontender Extremities: avf LUA B&T  Lab Results:  Recent Labs  05/31/13 0645 06/01/13 0348  WBC 5.4 7.9  HGB 10.7* 12.7  HCT 32.6* 38.7  PLT 242 302   BMET:  Recent Labs  06/01/13 0004 06/01/13 0348  NA 134* 136  K 4.0 4.0  CL 89* 89*  CO2 30 29  GLUCOSE 106* 119*  BUN 24* 26*  CREATININE 5.63* 6.42*  CALCIUM 9.4 9.8    Recent Labs  05/29/13 1814  PTH 709.7*   Iron Studies:  Recent Labs  05/29/13 1814  IRON 14*  TIBC 194*    Studies/Results: Dg Chest Port 1 View  05/31/2013   CLINICAL DATA:  Nausea, tachycardia  EXAM: PORTABLE CHEST - 1 VIEW  COMPARISON:  May 29, 2013  FINDINGS: The heart size and mediastinal contours are stable. Patient status post prior median sternotomy. Both lungs are clear. The visualized skeletal structures are stable.  IMPRESSION: No active cardiopulmonary disease.   Electronically Signed   By: Abelardo Diesel M.D.   On: 05/31/2013 23:40   Dg Abd Portable 1v  05/31/2013   CLINICAL DATA:  Acute back pain  EXAM: PORTABLE ABDOMEN - 1 VIEW  COMPARISON:  None.  FINDINGS: The bowel gas pattern is normal. Residual contrast is identified  within the bowel. Multiple surgical clips and sutures are noted in the pelvis. There is scoliosis of spine.  IMPRESSION: No bowel obstruction.   Electronically Signed   By: Abelardo Diesel M.D.   On: 05/31/2013 23:49    I have reviewed the patient's current medications.  Assessment/Plan: 1 CRF stable new dry 2 HTN does not need meds 3 Anemia stable given Fe 4 HPTH meds P HD MWF, no bp meds, new wgt.    LOS: 4 days   [name] L 06/01/2013,11:56 AM

## 2013-06-01 NOTE — Consult Note (Addendum)
CARDIOLOGY CONSULT NOTE   Patient ID: Shawna [name] MRN: [id number], DOB/AGE: 01-12-1941   Admit date: 05/28/2013 Date of Consult: 06/01/2013   Primary Physician: [name], MD Primary Cardiologist: Herminio Commons, MD     Pt. Profile  72F with ESRD on MWF HD, recent GI bleed, PUD, CAD s/p 4V CABG, HFpEF admitted for SBO.  Cardiology is consulted for tachycardia and a question of ST abnormalities in the setting of nausea and vomiting after HD.    Problem List  Past Medical History  Diagnosis Date  . ESRD on hemodialysis     ESRD due to HTN, started dialysis 2011 and gets HD at [facility] with Dr Hinda Lenis on MWF schedule.  Access is LUA AVF as of Sept 2014.   Marland Kitchen Chronic bronchitis   . GERD (gastroesophageal reflux disease)   . PUD (peptic ulcer disease)   . History of lower GI bleeding   . Arthritis   . History of gout   . CAD (coronary artery disease)     a. 12/2011 NSTEMI/Cath/PCI LCX (2.25x14 Resolute DES) & D1 (2.25x22 Resolute DES);  b. 01/2012 Cath/PCI: LM 30, LAD 30p, 40-26m, D1 stent ok, 99 in sm branch of diag, LCX patent stent, OM1 20, RCA 95 ost (4.0x12 Promus DES), EF 55%;  c. 04/2012 Lexi Cardiolite  EF 48%, small area of scar @ base/mid inflat wall with mild peri-infarct ischemia.; CABG 12/4  . High cholesterol 12/2011  . Pneumonia ~ 2009  . Iron deficiency anemia   . TIA (transient ischemic attack)   . Anxiety   . History of blood transfusion 07/2011; 12/2011; 01/2012 X 2; 04/2012  . Carotid artery disease     a. [id number] LICA, [id number]   . Mitral regurgitation     a. Moderate by echo, 02/2012  . Myocardial infarction   . Ovarian cancer 1992  . Colon cancer 1992  . Chronic diastolic CHF (congestive heart failure)     a. 02/2012 Echo EF 60-65%, nl wall motion, Gr 1 DD, mod MR  . Hypertension   . AVM (arteriovenous malformation) of colon   . Esophageal stricture     Past Surgical History  Procedure Laterality Date  . Abdominal hysterectomy  1992  .  Appendectomy  06/1990  . Tubal ligation  1980's  . Av fistula placement  07/2009    left upper arm  . Thrombectomy / arteriovenous graft revision  2011    left upper arm  . Colon resection  1992  . Esophagogastroduodenoscopy  01/20/2012    Procedure: ESOPHAGOGASTRODUODENOSCOPY (EGD);  Surgeon: Ladene Artist, MD,FACG;  Location: [facility] ENDOSCOPY;  Service: Endoscopy;  Laterality: N/A;  . Dilation and curettage of uterus    . Coronary angioplasty with stent placement  12/15/11    "2"  . Coronary angioplasty with stent placement  y/2013    "1; makes total of 3" (05/02/2012)  . Coronary artery bypass graft  06/13/2012    Procedure: CORONARY ARTERY BYPASS GRAFTING (CABG);  Surgeon: Grace Isaac, MD;  Location: [place];  Service: Open Heart Surgery;  Laterality: N/A;  cabg x four;  using left internal mammary artery, and left leg greater saphenous vein harvested endoscopically  . Intraoperative transesophageal echocardiogram  06/13/2012    Procedure: INTRAOPERATIVE TRANSESOPHAGEAL ECHOCARDIOGRAM;  Surgeon: Grace Isaac, MD;  Location: [place];  Service: Open Heart Surgery;  Laterality: N/A;  . Colectomy  1992  . Esophagogastroduodenoscopy N/A 03/26/2013    Procedure: ESOPHAGOGASTRODUODENOSCOPY (EGD);  Surgeon:  Irene Shipper, MD;  Location: [facility] ENDOSCOPY;  Service: Endoscopy;  Laterality: N/A;  . Stomach surgery       Allergies  Allergies  Allergen Reactions  . Aspirin Other (See Comments)    Mess up her stomach; "makes my bowels have blood in them". Takes 81 mg EC Aspirin   . Contrast Media [Iodinated Diagnostic Agents] Itching  . Iron Itching and Other (See Comments)    "they gave me iron in dialysis; had to give me Benadryl cause I had to have the iron" (05/02/2012)  . Macrodantin [Nitrofurantoin Macrocrystal] Other (See Comments)    "broke me out in big old knots all over my body; had to go to ER"  . Penicillins Other (See Comments)    "makes me real weak when I take it; like I'll pass  out"  . Plavix [Clopidogrel Bisulfate] Rash  . Bactrim [Sulfamethoxazole-Trimethoprim] Rash  . Sulfa Antibiotics Rash  . Venofer [Ferric Oxide] Itching    Patient reports using Benadryl prior to doses as [facility]  . Dexilant [Dexlansoprazole] Other (See Comments)    Upset stomach  . Protonix [Pantoprazole Sodium] Rash    HPI   72F with ESRD on MWF HD, recent GI bleed, PUD, CAD s/p 4V CABG, HFpEF admitted for SBO on 05/28/13. SBO has been medically managed. She has history of prior abdominal surgeries.  She has generally progressed well and the team is advancing her diet.   At about 11pm tonight, [name] developed worsening abdominal pain with radiation to her back with nausea, and sinus tach.  ECG demonstrated ST with LVH and associated repol abnormalities. She was given a fluid bolus with improvement in her HR.   Of note, she had been unable to tolerate DAPT d/t GIB and has a contrast allergy. Currently taking baby ASA at home which she is tolerating. Has had chest pain in the past as a sign of ischemia.   On my interview she explicitly denies having had chest pain or shortness of breath today. She reports 3L of fluid was removed at HD today and this is "a lot" for her. She was orthostatic around dinner tonight as a result.   Inpatient Medications  . antiseptic oral rinse  15 mL Mouth Rinse q12n4p  . chlorhexidine  15 mL Mouth Rinse BID  . cinacalcet  30 mg Oral Q supper  . lanthanum  1,000 mg Oral TID WC  . multivitamin  1 tablet Oral QHS  . sodium chloride  3 mL Intravenous Q12H  . tobramycin-dexamethasone  1 drop Left Eye QID    Family History Family History  Problem Relation Age of Onset  . Other      noncontributory for early CAD  . Heart disease Mother     Heart Disease before age 83  . Hyperlipidemia Mother   . Hypertension Mother   . Heart disease Father   . Hyperlipidemia Father   . Hypertension Father      Social History History   Social History  .  Marital Status: Married    Spouse Name: N/A    Number of Children: N/A  . Years of Education: N/A   Occupational History  . Not on file.   Social History Main Topics  . Smoking status: Never Smoker   . Smokeless tobacco: Never Used  . Alcohol Use: No  . Drug Use: No  . Sexual Activity: Yes   Other Topics Concern  . Not on file   Social History  Narrative   Lives in [place], [place] with husband.  Dialysis pt - mwf.     Review of Systems  General:  Generally comfortable. No F/C.  Cardiovascular:  No chest pain, dyspnea on exertion, edema. Tells me she could feel rapid heart beat when she was in ST Dermatological: No rash, lesions/masses Respiratory: No cough, dyspnea Abdominal:   Recent nausea and vomiting, improved now compared to prior.  Neurologic:  No visual changes, wkns, changes in mental status. All other systems reviewed and are otherwise negative except as noted above.  Physical Exam  Blood pressure 104/67, pulse 101, temperature 99.1 F (37.3 C), temperature source Oral, resp. rate 16, height 5\' 1"  (1.549 m), weight 59.3 kg (130 lb 11.7 oz), SpO2 99.00%.  General: Pleasant, NAD Psych: Normal affect. Neuro: Alert and oriented X 3. Moves all extremities spontaneously. HEENT: Normal  Neck: Supple without bruits or JVD. Lungs:  Resp regular and unlabored, CTA. Heart: RRR no s3, s4, or murmurs. Abdomen: Soft,mildy tender, non-distended, BS + x 4.  Extremities: No clubbing, cyanosis or edema. DP/PT/femorals 2+ and equal bilaterally. R radial 2+. LUE AVF with bruit and thrill.   Labs   Recent Labs  05/29/13 1224 06/01/13 0057  TROPONINI 0.40* <0.30   Lab Results  Component Value Date   WBC 5.4 05/31/2013   HGB 10.7* 05/31/2013   HCT 32.6* 05/31/2013   MCV 97.6 05/31/2013   PLT 242 05/31/2013    Recent Labs Lab 05/29/13 1814  06/01/13 0004  NA 141  < > 134*  K 4.5  < > 4.0  CL 98  < > 89*  CO2 22  < > 30  BUN 66*  < > 24*  CREATININE 11.75*  < > 5.63*    CALCIUM 8.3*  8.1*  < > 9.4  PROT 6.0  --   --   BILITOT 0.4  --   --   ALKPHOS 98  --   --   ALT 9  --   --   AST 18  --   --   GLUCOSE 99  < > 106*  < > = values in this interval not displayed. Lab Results  Component Value Date   CHOL 129 07/07/2012   HDL 51 07/07/2012   LDLCALC 59 07/07/2012   TRIG 97 07/07/2012   Lab Results  Component Value Date   DDIMER 1.97* 07/03/2012    Radiology/Studies  Ct Abdomen Pelvis Wo Contrast  05/29/2013   CLINICAL DATA:  Abdominal pain. Nausea and vomiting. Chronic renal failure  EXAM: CT ABDOMEN AND PELVIS WITHOUT CONTRAST  TECHNIQUE: Multidetector CT imaging of the abdomen and pelvis was performed following the standard protocol without intravenous contrast.  COMPARISON:  CT 09/24/2009  FINDINGS: The lung bases are clear. Heart size is within normal limits. No pleural effusion. Small hiatal hernia.  Liver gallbladder and bile ducts are normal. Pancreas and spleen show no acute abnormality. Markedly atrophic kidneys compatible with chronic renal failure. 2 cm right upper pole renal lesion unchanged. No hydronephrosis.  Small bowel dilatation. There is a dilated loop of jejunum measuring 32 mm in diameter suggestive of small bowel obstruction. The colon is decompressed. There is barium in the colon with colonic diverticulosis present. No significant colonic thickening.  Negative for free fluid.  Negative for mass or adenopathy.  IMPRESSION: Small bowel dilatation consistent with partial small bowel obstruction.  Colonic diverticulosis.   Electronically Signed   By: Franchot Gallo M.D.   On: 05/29/2013 07:28  Dg Chest Port 1 View  05/31/2013   CLINICAL DATA:  Nausea, tachycardia  EXAM: PORTABLE CHEST - 1 VIEW  COMPARISON:  May 29, 2013  FINDINGS: The heart size and mediastinal contours are stable. Patient status post prior median sternotomy. Both lungs are clear. The visualized skeletal structures are stable.  IMPRESSION: No active  cardiopulmonary disease.   Electronically Signed   By: Abelardo Diesel M.D.   On: 05/31/2013 23:40   Portable Chest 1 View  05/29/2013   CLINICAL DATA:  Cough, abdominal pain  EXAM: PORTABLE CHEST - 1 VIEW  COMPARISON:  02/20/2013.  FINDINGS: The heart size and mediastinal contours are within normal limits. There is evidence of prior CABG. There is left basilar scarring. The lungs are otherwise clear. No pleural effusion or pneumothorax. There are degenerative changes of bilateral acromioclavicular joints.  IMPRESSION: No active disease.   Electronically Signed   By: Kathreen Devoid   On: 05/29/2013 12:11   Dg Abd Portable 1v  05/31/2013   CLINICAL DATA:  Acute back pain  EXAM: PORTABLE ABDOMEN - 1 VIEW  COMPARISON:  None.  FINDINGS: The bowel gas pattern is normal. Residual contrast is identified within the bowel. Multiple surgical clips and sutures are noted in the pelvis. There is scoliosis of spine.  IMPRESSION: No bowel obstruction.   Electronically Signed   By: Abelardo Diesel M.D.   On: 05/31/2013 23:49    04/11/13 TTE - Left ventricle: The cavity size was normal. There was moderate concentric hypertrophy. Systolic function was normal. The estimated ejection fraction was 55%. Wall motion was normal; there were no regional wall motion abnormalities. Doppler parameters are consistent with abnormal left ventricular relaxation (grade 1 diastolic dysfunction). Doppler parameters are consistent with high ventricular filling pressure. - Aortic valve: Mildly calcified annulus. Trileaflet. No significant regurgitation. Mean gradient: 38mm Hg (S). - Mitral valve: Mildly thickened leaflets . Mild regurgitation directed posteriorly - two jets noted. Regurgitant volume: 46ml (PISA). - Left atrium: The atrium was at the upper limits of normal in size. - Right atrium: Central venous pressure: 61mm Hg (est). - Atrial septum: No defect or patent foramen ovale was identified. - Tricuspid valve: Trivial  regurgitation. - Pulmonary arteries: PA peak pressure: 43mm Hg (S). - Pericardium, extracardiac: There was no pericardial effusion. Impressions:  - Comparison to TEE from December 2013. Moderate LVH with LVEF approximately [id number], grade 1 diastolic dysfunction with increasd filling pressures. Inferior hypokinesis no longer evident. Upper normal left atrial size. Mild, posteriorly directed mitral regurgitation in two jets, regurgitant volume 20 ml - appears less significant compared to prior study. PASP 33 mmHg.      ECG 05/28/13 @ 22:00 NSR, LVH with repol 05/31/13 @22 :48: ST @ 112. LAE,  LVH with repolarization abnormalities 06/01/13 @ 1:59am: ST @ 101. Unchanged repol abnormalities    ASSESSMENT AND PLAN  1. The episode this evening was most consistent with pain induced sinus tachycardia.  Magnitude of sinus tach as response to pain was exaggerated due to aggressive fluid removal at HD.  2. Abdominal pain (with radiation to the back) was most consistent with SBO and not atypical anginal equivalent.  3. ST depressions are repolarization abnormalities consistent with known diagnosis of LVH; these changes can become exaggerated in setting of stressors (sinus tach, very elevated BP, etc)  Recs: 1. Restart CAD meds: ASA 81mg , atorvastatin 80mg , low dose beta-blocker (e.g. 12.5mg  metoprolol XL) before discharge 2. No further work-up for ACS needed at this time  Signed, Lamar Sprinkles, MD 06/01/2013, 2:39 AM

## 2013-06-01 NOTE — Progress Notes (Signed)
Patient discharge teaching given, including activity, diet, follow-up appoints, and medications. Patient verbalized understanding of all discharge instructions. IV access was d/c'd. Vitals are stable. Skin is intact except as charted in most recent assessments. Pt to be escorted out by NT, to be driven home by family.  Aireona Torelli, MBA, BS, RN 

## 2013-06-01 NOTE — Progress Notes (Signed)
Subjective: Shawna [name] was seen and examined this morning on HD.  She says she tolerated dinner last night and has not had N/V or abdominal pain.  She has not had a BM in two days but says that is not unusual and she is passing gas.    Objective: Vital signs in last 24 hours: Filed Vitals:   06/01/13 0419 06/01/13 0500 06/01/13 0630 06/01/13 0859  BP: 100/58 96/61 123/65 131/55  Pulse: 105 104 97 98  Temp:    98 F (36.7 C)  TempSrc:    Oral  Resp: 19 18 20 20   Height:      Weight:      SpO2: 96% 97% 100% 100%   Weight change: -5 lb 0.9 oz (-2.294 kg)  Intake/Output Summary (Last 24 hours) at 06/01/13 1132 Last data filed at 06/01/13 0900  Gross per 24 hour  Intake   1090 ml  Output   2100 ml  Net  -1010 ml   General: resting in bed in NAD Cardiac: RRR, no rubs, murmurs or gallops Pulm: clear to auscultation bilaterally, moving normal volumes of air Abd: soft, nondistended, BS present, nontender but says it feels "sore" Ext: warm and well perfused, no pedal edema Neuro: alert and oriented X3  Lab Results: Basic Metabolic Panel:  Recent Labs Lab 05/29/13 1224  05/31/13 0645 06/01/13 0004 06/01/13 0348  NA  --   < > 140 134* 136  K  --   < > 4.3 4.0 4.0  CL  --   < > 98 89* 89*  CO2  --   < > 24 30 29   GLUCOSE  --   < > 79 106* 119*  BUN  --   < > 38* 24* 26*  CREATININE  --   < > 9.20* 5.63* 6.42*  CALCIUM  --   < > 8.4 9.4 9.8  MG 1.7  --   --   --   --   PHOS  --   < > 6.1*  --  4.6  < > = values in this interval not displayed. Liver Function Tests:  Recent Labs Lab 05/28/13 2158 05/29/13 1814 05/31/13 0645 06/01/13 0348  AST 31 18  --   --   ALT 15 9  --   --   ALKPHOS 126* 98  --   --   BILITOT 0.4 0.4  --   --   PROT 8.1 6.0  --   --   ALBUMIN 4.3 3.2* 3.4* 4.0    Recent Labs Lab 05/28/13 2158  LIPASE 47   CBC:  Recent Labs Lab 05/28/13 2158  05/31/13 0645 06/01/13 0348  WBC 12.1*  < > 5.4 7.9  NEUTROABS 10.1*  --   --   --     HGB 13.1  < > 10.7* 12.7  HCT 38.7  < > 32.6* 38.7  MCV 95.8  < > 97.6 99.5  PLT 289  < > 242 302  < > = values in this interval not displayed. Cardiac Enzymes:  Recent Labs Lab 05/29/13 1224 06/01/13 0057  TROPONINI 0.40* <0.30   CBG:  Recent Labs Lab 05/30/13 2353 05/31/13 0409 05/31/13 1609 05/31/13 2110 05/31/13 2249 06/01/13 0802  GLUCAP 113* 114* 123* 126* 107* 88   Anemia Panel:  Recent Labs Lab 05/29/13 1814  TIBC 194*  IRON 14*   Micro Results: Recent Results (from the past 240 hour(s))  MRSA PCR SCREENING     Status:  None   Collection Time    05/29/13 11:56 AM      Result Value Range Status   MRSA by PCR NEGATIVE  NEGATIVE Final   Comment:            The GeneXpert MRSA Assay (FDA     approved for NASAL specimens     only), is one component of a     comprehensive MRSA colonization     surveillance program. It is not     intended to diagnose MRSA     infection nor to guide or     monitor treatment for     MRSA infections.   Studies/Results: Dg Chest Port 1 View  05/31/2013   CLINICAL DATA:  Nausea, tachycardia  EXAM: PORTABLE CHEST - 1 VIEW  COMPARISON:  May 29, 2013  FINDINGS: The heart size and mediastinal contours are stable. Patient status post prior median sternotomy. Both lungs are clear. The visualized skeletal structures are stable.  IMPRESSION: No active cardiopulmonary disease.   Electronically Signed   By: Abelardo Diesel M.D.   On: 05/31/2013 23:40   Dg Abd Portable 1v  05/31/2013   CLINICAL DATA:  Acute back pain  EXAM: PORTABLE ABDOMEN - 1 VIEW  COMPARISON:  None.  FINDINGS: The bowel gas pattern is normal. Residual contrast is identified within the bowel. Multiple surgical clips and sutures are noted in the pelvis. There is scoliosis of spine.  IMPRESSION: No bowel obstruction.   Electronically Signed   By: Abelardo Diesel M.D.   On: 05/31/2013 23:49   Medications: I have reviewed the patient's current medications. Scheduled  Meds: . antiseptic oral rinse  15 mL Mouth Rinse q12n4p  . chlorhexidine  15 mL Mouth Rinse BID  . cinacalcet  30 mg Oral Q supper  . lanthanum  1,000 mg Oral TID WC  . multivitamin  1 tablet Oral QHS  . sodium chloride  3 mL Intravenous Q12H  . tobramycin-dexamethasone  1 drop Left Eye QID   Continuous Infusions:  PRN Meds:.camphor-menthol, diphenhydrAMINE, hydrALAZINE, morphine injection, promethazine  Assessment/Plan: The patient is a 72 year-old woman with a PMH of multiple abdominal surgeries including appendectomy, total hysterectomy and colon resection, CAD/CHF, s/p CABG in Dec 2013, ESRD MWF, HTN, recent hospitalization for UGIB in September 2014, Who presents to the ED for acute nausea, vomiting and abdominal pain. Abd CT showed evidence of partial small bowel obstruction.   # Hypotension and tachycardia: pt appears to have been dehydrated and had a clonidine patch that was not properly removed and in setting of decreased po intake over past few days may not have been able to tolerate normal HD session. Fully resolved in AM. Cardiology consulted and indicate that all reflective of pain and dehydration and trops negative, no new ekg changes this AM and all labs normal along with imaging studying.   #Small bowel obstruction, partial-resolved Patient presents with acute onset of the nausea, vomiting, abdominal distention and pain x1 day. Physical examination revealed epigastric area tenderness and mild abd distention. And abdomen CT scan confirmed partial small bowel obstruction.  The etiology may associated with her past hx of multiple abdominal surgeries.  She has a history of ovarian cancer and colon cancer about 20 years ago, and states that she is cancer free since 65. She denies B symptoms and no indication of abnormal tumor/lesion on abd CT, which make cancer relapse less likely.  Her N/V has resolved and she is tolerating a full diet.   -  will plan for discharge today   #  Essential hypertension, benign  Her home meds include Amlodipine, clonidine, hydralazine and PRN labetalol.   - Clonidine was to be d/c 2/2 to soft BPs (per nephrology), however the patch was never removed   - BPs still soft, will continue to hold BP meds at discharge, patient will have HD on 06/03/13 and BP meds can be resumed at that time  # ESRD on hemodialysis and Anemia of chronic kidney failure  - HD completed during admission  # HLD (hyperlipidemia)-- resume meds at discharge   # GERD (gastroesophageal reflux disease)- resume PPI at discharge  # Hx of CABG--stable   # VTE: SCD given recent hospitalization for UGIB with multiple blood transfusion.    Dispo: The patient is feeling well, denies N/V and has tolerated full diet.  She is stable for discharge this afternoon after HD.  The patient does have a current PCP [name], MD) and does need an [facility] hospital follow-up appointment after discharge.  The patient does not know have transportation limitations that hinder transportation to clinic appointments.  .Services Needed at time of discharge: Y = Yes, Blank = No PT:   OT:   RN:   Equipment:   Other:     LOS: 4 days   Clinton Gallant, MD 06/01/2013, 11:32 AM

## 2013-06-01 NOTE — Progress Notes (Signed)
Internal Medicine Attending  Date: 06/01/2013  Patient name: Shawna [name] Medical record number: [id number] Date of birth: [birth year] Age: 72 y.o. Gender: female  I saw and evaluated the patient. I reviewed the resident's note by Dr. Algis Liming and I agree with the resident's findings and plans as documented in her progress note.  Yesterday evening's hypotension was likely secondary to aggressive volume removal during dialysis in the setting of a clonidine patch.  She is back to baseline from a blood pressure standpoint and feels well.  I agree with discharge home today on a lower dose of clonidine (0.2 mg daily) and further titration off if her blood pressures remain soft.  I am hesitant to discontinue it acutely for fear of some rebound hypertension.  Follow-up will be in her dialysis unit and with her personal primary care provider.

## 2013-06-01 NOTE — Progress Notes (Addendum)
Internal Medicine Teaching Service Night Float Progress note  S: Returned to see patient, nursing reports she had 10/10 back pain and has vomited x1 despite phenergan. In addition she has reported abdominal pain.  On seeing the patient she appears more sedated than previously, likely secondary to morphine given earlier in the evening (0.5mg  x1).  RN reports she was rolled over on her side,complaining of abdominal and back pain along with nausea.  Currently, she reports her back pain is an 8/10 located between her shoulder blades. And she remains nauseous. She says her abdominal pain is intermittent, mainly in epigastric region.   O:  BP: 102/64 P: 101 O2 sat 95% on 3L [place]. Gen: Resting in bed, more sedated than previously Cardiac: Tachycardic Abd: Guarding, tenderness to light palpation in epigastric region, + BS Ext: moving all 4 extremities, -edema lower extremities Neuro: drowsy, but Alert and oriented, easily able to awaken.   A: 72 year old female with ESRD noted to be orthostatic hypotensive s/p HD earlier today with c/o back pain, N/V, abdominal pain and currently in sinus tachycardia.  P: - f/u troponin negative - Lactic acid neg at 1.1 and Abdominal xray shows no obvious obstruction, thus, unlikely SBO or Mesenteric ischemia as cause. - No obvious signs of aortic dissection on chest or abdominal xray. - EKG concerning for new TWI in lateral leads - Called and spoke to Cardiology Fellow on call, Dr. Tommi Rumps, who will come and evaluate for possible anginal equivalent and given new EKG changes - NS total 250cc running, may need to run slow given limited IV access - will continue to monitor, consider repeat CT imaging  -Simethicone x1.  Addendum: Checked on patient again at 2am, 2:30am.  Patient now sleeping comfortably, HR 99, O2 Sat 98-99% on 2L [employer].  No longer complaining of back or abdominal pain.  Will continue to monitor. Joni Reining, DO 1:29 AM

## 2013-06-05 NOTE — Discharge Summary (Signed)
I did not see Shawna [name] on day of discharge, but assisted with the discharge planning.

## 2013-06-17 ENCOUNTER — Encounter: Payer: Self-pay | Admitting: Cardiovascular Disease

## 2013-06-17 ENCOUNTER — Ambulatory Visit (INDEPENDENT_AMBULATORY_CARE_PROVIDER_SITE_OTHER): Payer: Medicare Other | Admitting: Cardiovascular Disease

## 2013-06-17 VITALS — BP 157/79 | HR 73 | Ht 61.0 in | Wt 141.0 lb

## 2013-06-17 DIAGNOSIS — Z79899 Other long term (current) drug therapy: Secondary | ICD-10-CM

## 2013-06-17 DIAGNOSIS — I255 Ischemic cardiomyopathy: Secondary | ICD-10-CM

## 2013-06-17 DIAGNOSIS — I2510 Atherosclerotic heart disease of native coronary artery without angina pectoris: Secondary | ICD-10-CM

## 2013-06-17 DIAGNOSIS — I5032 Chronic diastolic (congestive) heart failure: Secondary | ICD-10-CM

## 2013-06-17 DIAGNOSIS — I2589 Other forms of chronic ischemic heart disease: Secondary | ICD-10-CM

## 2013-06-17 DIAGNOSIS — I509 Heart failure, unspecified: Secondary | ICD-10-CM

## 2013-06-17 DIAGNOSIS — I10 Essential (primary) hypertension: Secondary | ICD-10-CM

## 2013-06-17 DIAGNOSIS — I340 Nonrheumatic mitral (valve) insufficiency: Secondary | ICD-10-CM

## 2013-06-17 DIAGNOSIS — E785 Hyperlipidemia, unspecified: Secondary | ICD-10-CM

## 2013-06-17 DIAGNOSIS — Z951 Presence of aortocoronary bypass graft: Secondary | ICD-10-CM

## 2013-06-17 DIAGNOSIS — I059 Rheumatic mitral valve disease, unspecified: Secondary | ICD-10-CM

## 2013-06-17 MED ORDER — LABETALOL HCL 100 MG PO TABS: 100.0000 mg | ORAL_TABLET | Freq: Two times a day (BID) | ORAL | Status: DC

## 2013-06-17 NOTE — Progress Notes (Signed)
Patient ID: Shawna [name], female   DOB: May 20, 1941, 72 y.o.   MRN: [id number]      SUBJECTIVE: Shawna [name] is here today for routine follow-up. I saw her in consultation earlier this year at [facility] for a mildly elevated troponin in the setting of profound anemia, for which she received multiple PRBC transfusions. She had an EGD which revealed nonbleeding duodenal AVM's. She had been on ASA 81 mg daily and Brilinta, both of which were held.   At the time of discharge, GI felt it would be permissible to restart ASA.   Today, she denies any bleeding (hematochezia, melena) and also denies shortness of breath. She's had a chronic left-sided chest pain since her CABG on 06/13/12, deemed to be musculoskeletal in etiology.  At that time, she had a LIMA-LAD, SVG-RCA, SVG-ramus intermedius, SVG-LCx. Prior to that, she had undergone stenting in June and July of 2013, but in December 2013, she had presented with a NSTEMI and was found to have restenosis of the stents to the RCA and LCx, and significant LAD disease.  Post-operative TEE found her LV systolic function to be mildly reduced, EF 40-45%, with severe inferior wall hypokinesis. She was also noted to have severe mitral regurgitation with systolic pulmonary vein flow reversal, without annular dilatation, and deemed to be ischemic in etiology. A repeat echo in October 2014 showed normalized LV systolic function, and only mild mitral regurgitation.  She had discontinued the Lipitor she had been taking on her own, as she heard it may cause memory problems. Once stopping the Brilinta, she had restarted the Lipitor and continued with EC ASA 81 mg daily. Since her last visit with me, she stopped Lipitor again as a friend told her it causes diabetes, and the patient is the only family member who does not have diabetes.  She also has bilateral carotid artery stenosis, found to be <40% in 01/2013.  She has ESRD with hemodialysis on M,W, and F.   She tells me she  was recently hospitalized for bowel obstruction, which resolved after two days of being NPO. She was taken off of amlodipine and labetalol due to hypotension, and now only takes clonidine prn.  She eats oatmeal with flax seed every morning to control her lipids.     SocHx: has 2 daughters, one in [place] and one in [place]. Has twin granddaughters and one grandson in [place].   Allergies  Allergen Reactions  . Aspirin Other (See Comments)    Mess up her stomach; "makes my bowels have blood in them". Takes 81 mg EC Aspirin   . Contrast Media [Iodinated Diagnostic Agents] Itching  . Iron Itching and Other (See Comments)    "they gave me iron in dialysis; had to give me Benadryl cause I had to have the iron" (05/02/2012)  . Macrodantin [Nitrofurantoin Macrocrystal] Other (See Comments)    "broke me out in big old knots all over my body; had to go to ER"  . Penicillins Other (See Comments)    "makes me real weak when I take it; like I'll pass out"  . Plavix [Clopidogrel Bisulfate] Rash  . Bactrim [Sulfamethoxazole-Trimethoprim] Rash  . Sulfa Antibiotics Rash  . Venofer [Ferric Oxide] Itching    Patient reports using Benadryl prior to doses as [facility]  . Dexilant [Dexlansoprazole] Other (See Comments)    Upset stomach  . Protonix [Pantoprazole Sodium] Rash    Current Outpatient Prescriptions  Medication Sig Dispense Refill  . albuterol (PROVENTIL HFA;VENTOLIN  HFA) 108 (90 BASE) MCG/ACT inhaler Inhale 2 puffs into the lungs every 6 (six) hours as needed for wheezing or shortness of breath.       . ALPRAZolam (XANAX) 0.5 MG tablet Take 0.5 mg by mouth 3 (three) times daily.      Marland Kitchen aspirin 81 MG chewable tablet Chew 1 tablet (81 mg total) by mouth daily.      . cloNIDine (CATAPRES) 0.2 MG tablet Take 1 tablet (0.2 mg total) by mouth daily.  30 tablet  1  . fluticasone (FLONASE) 50 MCG/ACT nasal spray Place 2 sprays into the nose daily.      . folic acid (FOLVITE) 1 MG tablet Take 1 tablet (1  mg total) by mouth daily.  30 tablet  6  . folic acid-vitamin b complex-vitamin c-selenium-zinc (DIALYVITE) 3 MG TABS Take 1 tablet by mouth daily.      . hydrOXYzine (ATARAX/VISTARIL) 25 MG tablet Take 25 mg by mouth daily as needed for itching.       . iron polysaccharides (NIFEREX) 150 MG capsule Take 150 mg by mouth daily.      Marland Kitchen lanthanum (FOSRENOL) 1000 MG chewable tablet Chew 1,000 mg by mouth 3 (three) times daily after meals.      . lidocaine-prilocaine (EMLA) cream Apply 1 application topically every Monday, Wednesday, and Friday.       Marland Kitchen LIPITOR 20 MG tablet TAKE 1 BY MOUTH DAILY AT   6PM *CHRISTOPHER BERGE,NP*  30 tablet  2  . loratadine (CLARITIN) 10 MG tablet Take 10 mg by mouth daily.       . nitroGLYCERIN (NITROSTAT) 0.4 MG SL tablet Place 0.4 mg under the tongue every 5 (five) minutes as needed for chest pain.      Marland Kitchen omeprazole (PRILOSEC) 40 MG capsule Take 1 capsule (40 mg total) by mouth daily.  30 capsule  11  . polyethylene glycol (MIRALAX / GLYCOLAX) packet Take 17 g by mouth daily as needed for mild constipation.      . SENSIPAR 30 MG tablet Take 30 mg by mouth daily.       Marland Kitchen tobramycin-dexamethasone (TOBRADEX) ophthalmic solution Place 1 drop into the left eye 4 (four) times daily.       . traMADol (ULTRAM) 50 MG tablet Take 50 mg by mouth every 6 (six) hours as needed for pain.       Marland Kitchen zolpidem (AMBIEN) 10 MG tablet        No current facility-administered medications for this visit.    Past Medical History  Diagnosis Date  . ESRD on hemodialysis     ESRD due to HTN, started dialysis 2011 and gets HD at [facility] with Dr Hinda Lenis on MWF schedule.  Access is LUA AVF as of Sept 2014.   Marland Kitchen Chronic bronchitis   . GERD (gastroesophageal reflux disease)   . PUD (peptic ulcer disease)   . History of lower GI bleeding   . Arthritis   . History of gout   . CAD (coronary artery disease)     a. 12/2011 NSTEMI/Cath/PCI LCX (2.25x14 Resolute DES) & D1 (2.25x22 Resolute DES);   b. 01/2012 Cath/PCI: LM 30, LAD 30p, 40-29m, D1 stent ok, 99 in sm branch of diag, LCX patent stent, OM1 20, RCA 95 ost (4.0x12 Promus DES), EF 55%;  c. 04/2012 Lexi Cardiolite  EF 48%, small area of scar @ base/mid inflat wall with mild peri-infarct ischemia.; CABG 12/4  . High cholesterol 12/2011  . Pneumonia ~  2009  . Iron deficiency anemia   . TIA (transient ischemic attack)   . Anxiety   . History of blood transfusion 07/2011; 12/2011; 01/2012 X 2; 04/2012  . Carotid artery disease     a. [id number] LICA, [id number]   . Mitral regurgitation     a. Moderate by echo, 02/2012  . Myocardial infarction   . Ovarian cancer 1992  . Colon cancer 1992  . Chronic diastolic CHF (congestive heart failure)     a. 02/2012 Echo EF 60-65%, nl wall motion, Gr 1 DD, mod MR  . Hypertension   . AVM (arteriovenous malformation) of colon   . Esophageal stricture     Past Surgical History  Procedure Laterality Date  . Abdominal hysterectomy  1992  . Appendectomy  06/1990  . Tubal ligation  1980's  . Av fistula placement  07/2009    left upper arm  . Thrombectomy / arteriovenous graft revision  2011    left upper arm  . Colon resection  1992  . Esophagogastroduodenoscopy  01/20/2012    Procedure: ESOPHAGOGASTRODUODENOSCOPY (EGD);  Surgeon: Ladene Artist, MD,FACG;  Location: [facility] ENDOSCOPY;  Service: Endoscopy;  Laterality: N/A;  . Dilation and curettage of uterus    . Coronary angioplasty with stent placement  12/15/11    "2"  . Coronary angioplasty with stent placement  y/2013    "1; makes total of 3" (05/02/2012)  . Coronary artery bypass graft  06/13/2012    Procedure: CORONARY ARTERY BYPASS GRAFTING (CABG);  Surgeon: Grace Isaac, MD;  Location: [place];  Service: Open Heart Surgery;  Laterality: N/A;  cabg x four;  using left internal mammary artery, and left leg greater saphenous vein harvested endoscopically  . Intraoperative transesophageal echocardiogram  06/13/2012    Procedure: INTRAOPERATIVE  TRANSESOPHAGEAL ECHOCARDIOGRAM;  Surgeon: Grace Isaac, MD;  Location: [place];  Service: Open Heart Surgery;  Laterality: N/A;  . Colectomy  1992  . Esophagogastroduodenoscopy N/A 03/26/2013    Procedure: ESOPHAGOGASTRODUODENOSCOPY (EGD);  Surgeon: Irene Shipper, MD;  Location: [facility] ENDOSCOPY;  Service: Endoscopy;  Laterality: N/A;  . Stomach surgery      History   Social History  . Marital Status: Married    Spouse Name: N/A    Number of Children: N/A  . Years of Education: N/A   Occupational History  . Not on file.   Social History Main Topics  . Smoking status: Never Smoker   . Smokeless tobacco: Never Used  . Alcohol Use: No  . Drug Use: No  . Sexual Activity: Yes   Other Topics Concern  . Not on file   Social History Narrative   Lives in [place], [place] with husband.  Dialysis pt - mwf.     Filed Vitals:   06/17/13 1507  BP: 157/79  Pulse: 73  Height: 5\' 1"  (1.549 m)  Weight: 141 lb (63.957 kg)    PHYSICAL EXAM General: NAD Neck: No JVD, no thyromegaly or thyroid nodule.  Lungs: Clear to auscultation bilaterally with normal respiratory effort. CV: Nondisplaced PMI.  Heart regular S1/S2, no S3/S4, no murmur.  No peripheral edema.  No carotid bruit.  Normal pedal pulses.  Abdomen: Soft, nontender, no hepatosplenomegaly, no distention.  Neurologic: Alert and oriented x 3.  Psych: Normal affect. Extremities: No clubbing or cyanosis.   ECG: reviewed and available in electronic records.   ECHO (04-11-13): - Left ventricle: The cavity size was normal. There was moderate concentric hypertrophy. Systolic function was normal. The  estimated ejection fraction was 55%. Wall motion was normal; there were no regional wall motion abnormalities. Doppler parameters are consistent with abnormal left ventricular relaxation (grade 1 diastolic dysfunction). Doppler parameters are consistent with high ventricular filling pressure. - Aortic valve: Mildly calcified annulus.  Trileaflet. No significant regurgitation. Mean gradient: 51mm Hg (S). - Mitral valve: Mildly thickened leaflets . Mild regurgitation directed posteriorly - two jets noted. Regurgitant volume: 49ml (PISA). - Left atrium: The atrium was at the upper limits of normal in size. - Right atrium: Central venous pressure: 91mm Hg (est). - Atrial septum: No defect or patent foramen ovale was identified. - Tricuspid valve: Trivial regurgitation. - Pulmonary arteries: PA peak pressure: 34mm Hg (S). - Pericardium, extracardiac: There was no pericardial effusion. Impressions:  - Comparison to TEE from December 2013. Moderate LVH with LVEF approximately [id number], grade 1 diastolic dysfunction with increasd filling pressures. Inferior hypokinesis no longer evident. Upper normal left atrial size. Mild, posteriorly directed mitral regurgitation in two jets, regurgitant volume 20 ml - appears less significant compared to prior study. PASP 33 mmHg.   ASSESSMENT AND PLAN: 1. CAD s/p CABG: she is symptomatically stable. I will continue EC ASA 81 mg daily, and I have recommended she restart a statin, perhaps simvastatin. She will look into this and get back to me. She is no longer on labetalol due to episodes of hypotension, but she does have a history of recalcitrant hypertension, and is hypertensive today. Ideally, she should be on a beta blocker. I will restart labetalol at a lower does of 100 mg bid (rather than 200 mg tid like she had been taking). 2. HTN: had previously been controlled on amlodipine, clonidine, hydralazine, and labetalol, but is elevated today and only takes prn clonidine. I will restart labetalol at 100 mg bid. 3. Hyperlipidemia: dietary modifications as noted in HPI (oatmeal, flax seed). I have recommended simvastatin, and she will consider this. 4. Mitral regurgitation: it appears after CABG, this has been attenuated and she now has normal LV systolic function with only mild MR. 5. Carotid  artery stenosis: continue surveillance monitoring.   Dispo: f/u 6 months.   Kate Sable, M.D., F.A.C.C.

## 2013-06-17 NOTE — Patient Instructions (Signed)
   Begin Labetalol 100mg  twice a day  - new sent to pharm  Call office after making decision to take statin or not Continue all other medications.   Your physician wants you to follow up in: 6 months.  You will receive a reminder letter in the mail one-two months in advance.  If you don't receive a letter, please call our office to schedule the follow up appointment

## 2013-06-19 ENCOUNTER — Other Ambulatory Visit: Payer: Self-pay | Admitting: Cardiology

## 2013-06-24 ENCOUNTER — Telehealth: Payer: Self-pay | Admitting: *Deleted

## 2013-06-24 MED ORDER — CLONIDINE HCL 0.2 MG PO TABS: 0.2000 mg | ORAL_TABLET | ORAL | Status: DC | PRN

## 2013-06-24 NOTE — Telephone Encounter (Signed)
Message copied by Fernande Boyden on Mon Jun 24, 2013  4:03 PM ------      Message from: Kate Sable A      Created: Mon Jun 24, 2013  9:27 AM      Regarding: RE: clarification       She had been on scheduled clonidine, but the patient told me her nephrologist took her off of all antihypertensives, and was told to take clonidine prn.             ----- Message -----         From: Fernande Boyden, RN         Sent: 06/21/2013   1:03 PM           To: Herminio Commons, MD, Fernande Boyden, RN      Subject: clarification                                            Dr Raliegh Ip,      Is this patient suppose to be on clonodine?, Lovett Sox, RN Patient Care Advocate                        cloNIDine (CATAPRES) 0.2 MG tablet  Take 1 tablet (0.2 mg total) by mouth daily.               ------

## 2013-07-02 ENCOUNTER — Ambulatory Visit: Payer: Medicare Other | Admitting: Cardiovascular Disease

## 2013-07-03 ENCOUNTER — Other Ambulatory Visit: Payer: Self-pay | Admitting: Cardiology

## 2013-08-06 ENCOUNTER — Other Ambulatory Visit: Payer: Self-pay | Admitting: Gastroenterology

## 2013-08-07 ENCOUNTER — Encounter: Payer: Self-pay | Admitting: Family

## 2013-08-08 ENCOUNTER — Encounter: Payer: Self-pay | Admitting: Family

## 2013-08-08 ENCOUNTER — Ambulatory Visit (INDEPENDENT_AMBULATORY_CARE_PROVIDER_SITE_OTHER): Payer: Medicare Other | Admitting: Family

## 2013-08-08 ENCOUNTER — Ambulatory Visit (INDEPENDENT_AMBULATORY_CARE_PROVIDER_SITE_OTHER)
Admission: RE | Admit: 2013-08-08 | Discharge: 2013-08-08 | Disposition: A | Discharge status: Home / Self Care | Payer: Medicare Other | Source: Ambulatory Visit | Attending: Vascular Surgery | Admitting: Vascular Surgery

## 2013-08-08 ENCOUNTER — Ambulatory Visit (HOSPITAL_COMMUNITY)
Admission: RE | Admit: 2013-08-08 | Discharge: 2013-08-08 | Disposition: A | Discharge status: Home / Self Care | Payer: Medicare Other | Source: Ambulatory Visit | Attending: Family | Admitting: Family

## 2013-08-08 VITALS — BP 185/82 | HR 74 | Resp 16 | Ht 61.0 in | Wt 145.0 lb

## 2013-08-08 DIAGNOSIS — I739 Peripheral vascular disease, unspecified: Secondary | ICD-10-CM | POA: Insufficient documentation

## 2013-08-08 DIAGNOSIS — I658 Occlusion and stenosis of other precerebral arteries: Secondary | ICD-10-CM | POA: Insufficient documentation

## 2013-08-08 DIAGNOSIS — I6529 Occlusion and stenosis of unspecified carotid artery: Secondary | ICD-10-CM

## 2013-08-08 NOTE — Progress Notes (Signed)
[facility] [place] HISTORY AND PHYSICAL   MRN : [id number]  History of Present Illness:   Shawna [name] is a 73 y.o. female patient of Dr. Oneida Alar who has been monitored for bilateral minimal internal carotid artery stenosis and PAD. She was initially referred by her PCP for auscultated neck bruit. She returns today for surveillance. She has not had carotid artery intervention nor LE intervention.  She has had a left AV fistula placement in her upper arm in 2011and is taking HD for CRF. She is hypertensive today, states she took her medications today, but states her blood pressure is usually low. She had a CABG, 2 vessel, Dec. 2013. Pt denies claudication symptoms, denies non healing wounds. Pt states she had a TIA about 6 years ago as manifested by "my mouth was twisted" and transient expressive aphasia, denies any change in vision, denies hemiparesis, denies dysphagia.  Pt Diabetic: No Pt smoker: non-smoker  Current Outpatient Prescriptions  Medication Sig Dispense Refill  . albuterol (PROVENTIL HFA;VENTOLIN HFA) 108 (90 BASE) MCG/ACT inhaler Inhale 2 puffs into the lungs every 6 (six) hours as needed for wheezing or shortness of breath.       . ALPRAZolam (XANAX) 0.5 MG tablet Take 0.5 mg by mouth 3 (three) times daily.      Marland Kitchen aspirin 81 MG chewable tablet Chew 1 tablet (81 mg total) by mouth daily.      . cloNIDine (CATAPRES) 0.2 MG tablet Take 1 tablet (0.2 mg total) by mouth as needed.  30 tablet  1  . fluticasone (FLONASE) 50 MCG/ACT nasal spray Place 2 sprays into the nose daily.      . folic acid (FOLVITE) 1 MG tablet TAKE 1 BY MOUTH DAILY      -E. SERPE, NP  30 tablet  5  . folic acid-vitamin b complex-vitamin c-selenium-zinc (DIALYVITE) 3 MG TABS Take 1 tablet by mouth daily.      . hydrOXYzine (ATARAX/VISTARIL) 25 MG tablet Take 25 mg by mouth daily as needed for itching.       . iron polysaccharides (NIFEREX) 150 MG capsule Take 150 mg by mouth daily.       Marland Kitchen labetalol (NORMODYNE) 100 MG tablet Take 1 tablet (100 mg total) by mouth 2 (two) times daily.  60 tablet  6  . lanthanum (FOSRENOL) 1000 MG chewable tablet Chew 1,000 mg by mouth 3 (three) times daily after meals.      . lidocaine-prilocaine (EMLA) cream Apply 1 application topically every Monday, Wednesday, and Friday.       Marland Kitchen LIPITOR 20 MG tablet TAKE 1 BY MOUTH DAILY AT   6PM *CHRISTOPHER BERGE,NP*  30 tablet  2  . loratadine (CLARITIN) 10 MG tablet Take 10 mg by mouth daily.       Marland Kitchen losartan (COZAAR) 100 MG tablet TAKE 1 BY MOUTH DAILY  30 tablet  5  . nitroGLYCERIN (NITROSTAT) 0.4 MG SL tablet Place 0.4 mg under the tongue every 5 (five) minutes as needed for chest pain.      Marland Kitchen omeprazole (PRILOSEC) 40 MG capsule TAKE ONE CAPSULE BY MOUTH ONCE DAILY  30 capsule  5  . polyethylene glycol (MIRALAX / GLYCOLAX) packet Take 17 g by mouth daily as needed for mild constipation.      . SENSIPAR 30 MG tablet Take 30 mg by mouth daily.       Marland Kitchen tobramycin-dexamethasone (TOBRADEX) ophthalmic solution Place 1 drop into the left eye 4 (four) times  daily.       . traMADol (ULTRAM) 50 MG tablet Take 50 mg by mouth every 6 (six) hours as needed for pain.       Marland Kitchen zolpidem (AMBIEN) 10 MG tablet        No current facility-administered medications for this visit.    Pt meds include: Statin :No, pt. States she stopped taking as she has heard that it causes DM. ASA: Yes Other anticoagulants/antiplatelets: no  Past Medical History  Diagnosis Date  . ESRD on hemodialysis     ESRD due to HTN, started dialysis 2011 and gets HD at [facility] with Dr Hinda Lenis on MWF schedule.  Access is LUA AVF as of Sept 2014.   Marland Kitchen Chronic bronchitis   . GERD (gastroesophageal reflux disease)   . PUD (peptic ulcer disease)   . History of lower GI bleeding   . Arthritis   . History of gout   . CAD (coronary artery disease)     a. 12/2011 NSTEMI/Cath/PCI LCX (2.25x14 Resolute DES) & D1 (2.25x22 Resolute DES);  b.  01/2012 Cath/PCI: LM 30, LAD 30p, 40-71m, D1 stent ok, 99 in sm branch of diag, LCX patent stent, OM1 20, RCA 95 ost (4.0x12 Promus DES), EF 55%;  c. 04/2012 Lexi Cardiolite  EF 48%, small area of scar @ base/mid inflat wall with mild peri-infarct ischemia.; CABG 12/4  . High cholesterol 12/2011  . Pneumonia ~ 2009  . Iron deficiency anemia   . TIA (transient ischemic attack)   . Anxiety   . History of blood transfusion 07/2011; 12/2011; 01/2012 X 2; 04/2012  . Carotid artery disease     a. A999333 LICA, [id number]   . Mitral regurgitation     a. Moderate by echo, 02/2012  . Myocardial infarction   . Ovarian cancer 1992  . Colon cancer 1992  . Chronic diastolic CHF (congestive heart failure)     a. 02/2012 Echo EF 60-65%, nl wall motion, Gr 1 DD, mod MR  . Hypertension   . AVM (arteriovenous malformation) of colon   . Esophageal stricture     Past Surgical History  Procedure Laterality Date  . Abdominal hysterectomy  1992  . Appendectomy  06/1990  . Tubal ligation  1980's  . Av fistula placement  07/2009    left upper arm  . Thrombectomy / arteriovenous graft revision  2011    left upper arm  . Colon resection  1992  . Esophagogastroduodenoscopy  01/20/2012    Procedure: ESOPHAGOGASTRODUODENOSCOPY (EGD);  Surgeon: Ladene Artist, MD,FACG;  Location: [facility] ENDOSCOPY;  Service: Endoscopy;  Laterality: N/A;  . Dilation and curettage of uterus    . Coronary angioplasty with stent placement  12/15/11    "2"  . Coronary angioplasty with stent placement  y/2013    "1; makes total of 3" (05/02/2012)  . Coronary artery bypass graft  06/13/2012    Procedure: CORONARY ARTERY BYPASS GRAFTING (CABG);  Surgeon: Grace Isaac, MD;  Location: [place];  Service: Open Heart Surgery;  Laterality: N/A;  cabg x four;  using left internal mammary artery, and left leg greater saphenous vein harvested endoscopically  . Intraoperative transesophageal echocardiogram  06/13/2012    Procedure: INTRAOPERATIVE  TRANSESOPHAGEAL ECHOCARDIOGRAM;  Surgeon: Grace Isaac, MD;  Location: [place];  Service: Open Heart Surgery;  Laterality: N/A;  . Colectomy  1992  . Esophagogastroduodenoscopy N/A 03/26/2013    Procedure: ESOPHAGOGASTRODUODENOSCOPY (EGD);  Surgeon: Irene Shipper, MD;  Location: [place];  Service: Endoscopy;  Laterality: N/A;  . Stomach surgery      Social History History  Substance Use Topics  . Smoking status: Never Smoker   . Smokeless tobacco: Never Used  . Alcohol Use: No    Family History Family History  Problem Relation Age of Onset  . Other      noncontributory for early CAD  . Heart disease Mother     Heart Disease before age 110  . Hyperlipidemia Mother   . Hypertension Mother   . Heart disease Father   . Hyperlipidemia Father   . Hypertension Father     Allergies  Allergen Reactions  . Aspirin Other (See Comments)    Mess up her stomach; "makes my bowels have blood in them". Takes 81 mg EC Aspirin   . Contrast Media [Iodinated Diagnostic Agents] Itching  . Iron Itching and Other (See Comments)    "they gave me iron in dialysis; had to give me Benadryl cause I had to have the iron" (05/02/2012)  . Macrodantin [Nitrofurantoin Macrocrystal] Other (See Comments)    "broke me out in big old knots all over my body; had to go to ER"  . Penicillins Other (See Comments)    "makes me real weak when I take it; like I'll pass out"  . Plavix [Clopidogrel Bisulfate] Rash  . Bactrim [Sulfamethoxazole-Trimethoprim] Rash  . Sulfa Antibiotics Rash  . Venofer [Ferric Oxide] Itching    Patient reports using Benadryl prior to doses as [facility]  . Dexilant [Dexlansoprazole] Other (See Comments)    Upset stomach  . Protonix [Pantoprazole Sodium] Rash     REVIEW OF SYSTEMS: See HPI for pertinent positives and negatives.  Physical Examination Filed Vitals:   08/08/13 1454  BP: 185/82  Pulse: 74  Resp: 16   Filed Weights   08/08/13 1454  Weight: 145 lb  (65.772 kg)   Body mass index is 27.41 kg/(m^2).   General:  WDWN in NAD Gait: Normal HENT: WNL Eyes: Pupils equal Pulmonary: normal non-labored breathing , without Rales, rhonchi,  wheezing Cardiac: RRR, without  Murmurs, rubs or gallops;  Abdomen: soft, NT, no masses Skin: no rashes, ulcers noted;  no Gangrene , no cellulitis; no open wounds;   VASCULAR EXAM  Carotid Bruits Left Right   Negative Negative    Palpable thrill in left upper arm AV fistula.          Bilateral radial pulses are palpable.                VASCULAR EXAM: Extremities without ischemic changes  without Gangrene; without open wounds.                                                                                                          LE Pulses LEFT RIGHT       POPLITEAL  not palpable   not palpable       POSTERIOR TIBIAL  not palpable   not palpable        DORSALIS PEDIS  ANTERIOR TIBIAL  palpable   palpable     Musculoskeletal: no muscle wasting or atrophy; no edema  Neurologic: A&O X 3; Appropriate Affect ;  SENSATION: normal; MOTOR FUNCTION: 5/5 Symmetric, CN 2-12 intact Speech is fluent/normal  Non-Invasive Vascular Imaging (08/08/2013):  ABI's: Right: 0.7, Left: 0.71, mono and biphasic waveforms. Previous (01/24/13) ABI's: Right: 0.78, Left: 0.67  Carotid Duplex: <40% bilateral ICA stenosis  ASSESSMENT:  She has not had any stroke or TIA activity in 6 years, this was her only event. Both internal carotid arteries have minimal stenosis. ABI's in both legs indicate moderate arterial occlusive disease, however, she has no claudication symptoms with walking, no ischemic changes in her LE's. I advised patient to inform her PCP that she stopped the Lipitor. PLAN:   Based on today's exam and vascular studies, pt advised to return in 1 year for ABI's and carotid Duplex. I advised her to start a graduated walking program.  I discussed in depth with the patient the nature of  atherosclerosis, and emphasized the importance of maximal medical management including strict control of blood pressure, blood glucose, and lipid levels, obtaining regular exercise, and continued cessation of smoking.  The patient is aware that without maximal medical management the underlying atherosclerotic disease process will progress, limiting the benefit of any interventions.  The patient was given information about stroke prevention and what symptoms should prompt the patient to seek immediate medical care. . The patient was given information about PAD including signs, symptoms, treatment, what symptoms should prompt the patient to seek immediate medical care, and risk reduction measures to take.  Thank you for allowing [place] to participate in this patient's care.  Clemon Chambers, RN, MSN, FNP-C [facility] Office: [phone]  Clinic MD: Oneida Alar  08/08/2013 1:35 PM

## 2013-08-08 NOTE — Patient Instructions (Signed)
Stroke Prevention Some medical conditions and behaviors are associated with an increased chance of having a stroke. You may prevent a stroke by making healthy choices and managing medical conditions. HOW CAN I REDUCE MY RISK OF HAVING A STROKE?   Stay physically active. Get at least 30 minutes of activity on most or all days.  Do not smoke. It may also be helpful to avoid exposure to secondhand smoke.  Limit alcohol use. Moderate alcohol use is considered to be:  No more than 2 drinks per day for men.  No more than 1 drink per day for nonpregnant women.  Eat healthy foods. This involves  Eating 5 or more servings of fruits and vegetables a day.  Following a diet that addresses high blood pressure (hypertension), high cholesterol, diabetes, or obesity.  Manage your cholesterol levels.  A diet low in saturated fat, trans fat, and cholesterol and high in fiber may control cholesterol levels.  Take any prescribed medicines to control cholesterol as directed by your health care provider.  Manage your diabetes.  A controlled-carbohydrate, controlled-sugar diet is recommended to manage diabetes.  Take any prescribed medicines to control diabetes as directed by your health care provider.  Control your hypertension.  A low-salt (sodium), low-saturated fat, low-trans fat, and low-cholesterol diet is recommended to manage hypertension.  Take any prescribed medicines to control hypertension as directed by your health care provider.  Maintain a healthy weight.  A reduced-calorie, low-sodium, low-saturated fat, low-trans fat, low-cholesterol diet is recommended to manage weight.  Stop drug abuse.  Avoid taking birth control pills.  Talk to your health care provider about the risks of taking birth control pills if you are over 35 years old, smoke, get migraines, or have ever had a blood clot.  Get evaluated for sleep disorders (sleep apnea).  Talk to your health care provider about  getting a sleep evaluation if you snore a lot or have excessive sleepiness.  Take medicines as directed by your health care provider.  For some people, aspirin or blood thinners (anticoagulants) are helpful in reducing the risk of forming abnormal blood clots that can lead to stroke. If you have the irregular heart rhythm of atrial fibrillation, you should be on a blood thinner unless there is a good reason you cannot take them.  Understand all your medicine instructions.  Make sure that other other conditions (such as anemia or atherosclerosis) are addressed. SEEK IMMEDIATE MEDICAL CARE IF:   You have sudden weakness or numbness of the face, arm, or leg, especially on one side of the body.  Your face or eyelid droops to one side.  You have sudden confusion.  You have trouble speaking (aphasia) or understanding.  You have sudden trouble seeing in one or both eyes.  You have sudden trouble walking.  You have dizziness.  You have a loss of balance or coordination.  You have a sudden, severe headache with no known cause.  You have new chest pain or an irregular heartbeat. Any of these symptoms may represent a serious problem that is an emergency. Do not wait to see if the symptoms will go away. Get medical help at once. Call your local emergency services  (911 in [place] Do not drive yourself to the hospital. Document Released: 08/04/2004 Document Revised: 04/17/2013 Document Reviewed: 12/28/2012 Exit[employer] Patient Information 2014 [employer].   Peripheral Vascular Disease Peripheral Vascular Disease (PVD), also called Peripheral Arterial Disease (PAD), is a circulation problem caused by cholesterol (atherosclerotic plaque) deposits in the   arteries. PVD commonly occurs in the lower extremities (legs) but it can occur in other areas of the body, such as your arms. The cholesterol buildup in the arteries reduces blood flow which can cause pain and other serious problems. The presence  of PVD can place a person at risk for Coronary Artery Disease (CAD).  CAUSES  Causes of PVD can be many. It is usually associated with more than one risk factor such as:   High Cholesterol.  Smoking.  Diabetes.  Lack of exercise or inactivity.  High blood pressure (hypertension).  Obesity.  Family history. SYMPTOMS   When the lower extremities are affected, patients with PVD may experience:  Leg pain with exertion or physical activity. This is called INTERMITTENT CLAUDICATION. This may present as cramping or numbness with physical activity. The location of the pain is associated with the level of blockage. For example, blockage at the abdominal level (distal abdominal aorta) may result in buttock or hip pain. Lower leg arterial blockage may result in calf pain.  As PVD becomes more severe, pain can develop with less physical activity.  In people with severe PVD, leg pain may occur at rest.  Other PVD signs and symptoms:  Leg numbness or weakness.  Coldness in the affected leg or foot, especially when compared to the other leg.  A change in leg color.  Patients with significant PVD are more prone to ulcers or sores on toes, feet or legs. These may take longer to heal or may reoccur. The ulcers or sores can become infected.  If signs and symptoms of PVD are ignored, gangrene may occur. This can result in the loss of toes or loss of an entire limb.  Not all leg pain is related to PVD. Other medical conditions can cause leg pain such as:  Blood clots (embolism) or Deep Vein Thrombosis.  Inflammation of the blood vessels (vasculitis).  Spinal stenosis. DIAGNOSIS  Diagnosis of PVD can involve several different types of tests. These can include:  Pulse Volume Recording Method (PVR). This test is simple, painless and does not involve the use of X-rays. PVR involves measuring and comparing the blood pressure in the arms and legs. An ABI (Ankle-Brachial Index) is calculated.  The normal ratio of blood pressures is 1. As this number becomes smaller, it indicates more severe disease.  < 0.95  indicates significant narrowing in one or more leg vessels.  <0.8 there will usually be pain in the foot, leg or buttock with exercise.  <0.4 will usually have pain in the legs at rest.  <0.25  usually indicates limb threatening PVD.  Doppler detection of pulses in the legs. This test is painless and checks to see if you have a pulses in your legs/feet.  A dye or contrast material (a substance that highlights the blood vessels so they show up on x-ray) may be given to help your caregiver better see the arteries for the following tests. The dye is eliminated from your body by the kidney's. Your caregiver may order blood work to check your kidney function and other laboratory values before the following tests are performed:  Magnetic Resonance Angiography (MRA). An MRA is a picture study of the blood vessels and arteries. The MRA machine uses a large magnet to produce images of the blood vessels.  Computed Tomography Angiography (CTA). A CTA is a specialized x-ray that looks at how the blood flows in your blood vessels. An IV may be inserted into your arm so contrast dye   can be injected.  Angiogram. Is a procedure that uses x-rays to look at your blood vessels. This procedure is minimally invasive, meaning a small incision (cut) is made in your groin. A small tube (catheter) is then inserted into the artery of your groin. The catheter is guided to the blood vessel or artery your caregiver wants to examine. Contrast dye is injected into the catheter. X-rays are then taken of the blood vessel or artery. After the images are obtained, the catheter is taken out. TREATMENT  Treatment of PVD involves many interventions which may include:  Lifestyle changes:  Quitting smoking.  Exercise.  Following a low fat, low cholesterol diet.  Control of diabetes.  Foot care is very  important to the PVD patient. Good foot care can help prevent infection.  Medication:  Cholesterol-lowering medicine.  Blood pressure medicine.  Anti-platelet drugs.  Certain medicines may reduce symptoms of Intermittent Claudication.  Interventional/Surgical options:  Angioplasty. An Angioplasty is a procedure that inflates a balloon in the blocked artery. This opens the blocked artery to improve blood flow.  Stent Implant. A wire mesh tube (stent) is placed in the artery. The stent expands and stays in place, allowing the artery to remain open.  Peripheral Bypass Surgery. This is a surgical procedure that reroutes the blood around a blocked artery to help improve blood flow. This type of procedure may be performed if Angioplasty or stent implants are not an option. SEEK IMMEDIATE MEDICAL CARE IF:   You develop pain or numbness in your arms or legs.  Your arm or leg turns cold, becomes blue in color.  You develop redness, warmth, swelling and pain in your arms or legs. MAKE SURE YOU:   Understand these instructions.  Will watch your condition.  Will get help right away if you are not doing well or get worse. Document Released: 08/04/2004 Document Revised: 09/19/2011 Document Reviewed: 07/01/2008 [employer] Patient Information 2014 [employer].  

## 2013-08-14 ENCOUNTER — Encounter: Payer: Self-pay | Admitting: Adult Health

## 2013-08-14 ENCOUNTER — Ambulatory Visit (INDEPENDENT_AMBULATORY_CARE_PROVIDER_SITE_OTHER): Payer: Medicare Other | Admitting: Adult Health

## 2013-08-14 VITALS — BP 140/65 | HR 81 | Ht 61.0 in | Wt 143.0 lb

## 2013-08-14 DIAGNOSIS — I5022 Chronic systolic (congestive) heart failure: Secondary | ICD-10-CM

## 2013-08-14 DIAGNOSIS — I340 Nonrheumatic mitral (valve) insufficiency: Secondary | ICD-10-CM

## 2013-08-14 DIAGNOSIS — I059 Rheumatic mitral valve disease, unspecified: Secondary | ICD-10-CM

## 2013-08-14 DIAGNOSIS — I10 Essential (primary) hypertension: Secondary | ICD-10-CM

## 2013-08-14 DIAGNOSIS — I2510 Atherosclerotic heart disease of native coronary artery without angina pectoris: Secondary | ICD-10-CM

## 2013-08-14 DIAGNOSIS — I509 Heart failure, unspecified: Secondary | ICD-10-CM

## 2013-08-14 NOTE — Assessment & Plan Note (Signed)
She does not appear to be fluid overloaded at present. No changes to medication regimen.

## 2013-08-14 NOTE — Progress Notes (Deleted)
Name: Shawna [name]    DOB: 05-25-1941  Age: 73 y.o.  MR#: [id number]       PCP:  [name], MD      Insurance: Payor: [employer] MEDICARE / Plan: AARP MEDICARE COMPLETE / Product Type: *No Product type* /   CC:    Chief Complaint  Patient presents with  . Hypertension  . Coronary Artery Disease   PT ADVISED SHE STOPPED TAKING LIPITOR BECAUSE THE TV AND HER FRIEND TOLD HER IT WILL GIVE YOU DIABETES VS Filed Vitals:   08/14/13 1517  BP: 140/65  Pulse: 81  Height: 5\' 1"  (1.549 m)  Weight: 143 lb (64.864 kg)    Weights Current Weight  08/14/13 143 lb (64.864 kg)  08/08/13 145 lb (65.772 kg)  06/17/13 141 lb (63.957 kg)    Blood Pressure  BP Readings from Last 3 Encounters:  08/14/13 140/65  08/08/13 185/82  06/17/13 157/79     Admit date:  (Not on file) Last encounter with RMR:  Visit date not found   Allergy Aspirin; Contrast media; Iron; Macrodantin; Penicillins; Plavix; Bactrim; Sulfa antibiotics; Venofer; Dexilant; and Protonix  Current Outpatient Prescriptions  Medication Sig Dispense Refill  . albuterol (PROVENTIL HFA;VENTOLIN HFA) 108 (90 BASE) MCG/ACT inhaler Inhale 2 puffs into the lungs every 6 (six) hours as needed for wheezing or shortness of breath.       . ALPRAZolam (XANAX) 0.5 MG tablet Take 0.5 mg by mouth 3 (three) times daily.      . cloNIDine (CATAPRES) 0.2 MG tablet Take 1 tablet (0.2 mg total) by mouth as needed.  30 tablet  1  . fluticasone (FLONASE) 50 MCG/ACT nasal spray Place 2 sprays into the nose daily.      . folic acid (FOLVITE) 1 MG tablet TAKE 1 BY MOUTH DAILY      -E. SERPE, NP  30 tablet  5  . folic acid-vitamin b complex-vitamin c-selenium-zinc (DIALYVITE) 3 MG TABS Take 1 tablet by mouth daily.      . iron polysaccharides (NIFEREX) 150 MG capsule Take 150 mg by mouth daily.      Marland Kitchen labetalol (NORMODYNE) 100 MG tablet Take 1 tablet (100 mg total) by mouth 2 (two) times daily.  60 tablet  6  . lanthanum (FOSRENOL) 1000 MG chewable  tablet Chew 1,000 mg by mouth 3 (three) times daily after meals.      . lidocaine-prilocaine (EMLA) cream Apply 1 application topically every Monday, Wednesday, and Friday.       . losartan (COZAAR) 100 MG tablet TAKE 1 BY MOUTH DAILY  30 tablet  5  . nitroGLYCERIN (NITROSTAT) 0.4 MG SL tablet Place 0.4 mg under the tongue every 5 (five) minutes as needed for chest pain.      Marland Kitchen omeprazole (PRILOSEC) 40 MG capsule TAKE ONE CAPSULE BY MOUTH ONCE DAILY  30 capsule  5  . polyethylene glycol (MIRALAX / GLYCOLAX) packet Take 17 g by mouth daily as needed for mild constipation.      . SENSIPAR 30 MG tablet Take 30 mg by mouth daily.       Marland Kitchen tobramycin-dexamethasone (TOBRADEX) ophthalmic solution Place 1 drop into the left eye 4 (four) times daily.       . traMADol (ULTRAM) 50 MG tablet Take 50 mg by mouth every 6 (six) hours as needed for pain.       Marland Kitchen zolpidem (AMBIEN) 10 MG tablet       . aspirin 81 MG chewable  tablet Chew 1 tablet (81 mg total) by mouth daily.      Marland Kitchen loratadine (CLARITIN) 10 MG tablet Take 10 mg by mouth daily.        No current facility-administered medications for this visit.    Discontinued Meds:    Medications Discontinued During This Encounter  Medication Reason  . hydrOXYzine (ATARAX/VISTARIL) 25 MG tablet Error  . LIPITOR 20 MG tablet Error    Patient Active Problem List   Diagnosis Date Noted  . Small bowel obstruction, partial 05/29/2013  . Chronic systolic CHF (congestive heart failure) 03/22/2013  . GI bleeding 03/21/2013  . Acute blood loss anemia 03/21/2013  . Vaginal odor 03/12/2013  . Vaginal discharge 03/12/2013  . Abdominal  pain, other specified site 03/12/2013  . Occlusion and stenosis of carotid artery without mention of cerebral infarction 01/24/2013  . Hx of CABG 07/05/2012  . Carotid artery disease 07/05/2012  . Anemia of chronic kidney failure 07/03/2012  . Secondary hyperparathyroidism 07/03/2012  . Mitral regurgitation 06/12/2012  .  Pneumonia 06/09/2012  . Non-STEMI (non-ST elevated myocardial infarction) 06/08/2012  . GERD (gastroesophageal reflux disease) 01/09/2012  . HLD (hyperlipidemia) 01/05/2012  . Coronary atherosclerosis of native coronary artery 12/16/2011  . Essential hypertension, benign 12/16/2011  . ESRD on hemodialysis 12/16/2011    LABS    Component Value Date/Time   NA 136 06/01/2013 0348   NA 134* 06/01/2013 0004   NA 140 05/31/2013 0645   K 4.0 06/01/2013 0348   K 4.0 06/01/2013 0004   K 4.3 05/31/2013 0645   CL 89* 06/01/2013 0348   CL 89* 06/01/2013 0004   CL 98 05/31/2013 0645   CO2 29 06/01/2013 0348   CO2 30 06/01/2013 0004   CO2 24 05/31/2013 0645   GLUCOSE 119* 06/01/2013 0348   GLUCOSE 106* 06/01/2013 0004   GLUCOSE 79 05/31/2013 0645   BUN 26* 06/01/2013 0348   BUN 24* 06/01/2013 0004   BUN 38* 05/31/2013 0645   CREATININE 6.42* 06/01/2013 0348   CREATININE 5.63* 06/01/2013 0004   CREATININE 9.20* 05/31/2013 0645   CALCIUM 9.8 06/01/2013 0348   CALCIUM 9.4 06/01/2013 0004   CALCIUM 8.4 05/31/2013 0645   CALCIUM 8.1* 05/29/2013 1814   GFRNONAA 6* 06/01/2013 0348   GFRNONAA 7* 06/01/2013 0004   GFRNONAA 4* 05/31/2013 0645   GFRAA 7* 06/01/2013 0348   GFRAA 8* 06/01/2013 0004   GFRAA 4* 05/31/2013 0645   CMP     Component Value Date/Time   NA 136 06/01/2013 0348   K 4.0 06/01/2013 0348   CL 89* 06/01/2013 0348   CO2 29 06/01/2013 0348   GLUCOSE 119* 06/01/2013 0348   BUN 26* 06/01/2013 0348   CREATININE 6.42* 06/01/2013 0348   CALCIUM 9.8 06/01/2013 0348   CALCIUM 8.1* 05/29/2013 1814   PROT 6.0 05/29/2013 1814   ALBUMIN 4.0 06/01/2013 0348   AST 18 05/29/2013 1814   ALT 9 05/29/2013 1814   ALKPHOS 98 05/29/2013 1814   BILITOT 0.4 05/29/2013 1814   GFRNONAA 6* 06/01/2013 0348   GFRAA 7* 06/01/2013 0348       Component Value Date/Time   WBC 7.9 06/01/2013 0348   WBC 5.4 05/31/2013 0645   WBC 6.0 05/30/2013 0515   HGB 12.7 06/01/2013 0348   HGB 10.7*  05/31/2013 0645   HGB 11.3* 05/30/2013 0515   HCT 38.7 06/01/2013 0348   HCT 32.6* 05/31/2013 0645   HCT 35.4* 05/30/2013 0515   MCV 99.5 06/01/2013 0348  MCV 97.6 05/31/2013 0645   MCV 98.6 05/30/2013 0515    Lipid Panel     Component Value Date/Time   CHOL 129 07/07/2012 0815   TRIG 97 07/07/2012 0815   HDL 51 07/07/2012 0815   CHOLHDL 2.5 07/07/2012 0815   VLDL 19 07/07/2012 0815   LDLCALC 59 07/07/2012 0815    ABG    Component Value Date/Time   PHART 7.302* 06/14/2012 0510   PCO2ART 44.9 06/14/2012 0510   PO2ART 120.0* 06/14/2012 0510   HCO3 22.2 06/14/2012 0510   TCO2 24 08/05/2012 2333   ACIDBASEDEF 4.0* 06/14/2012 0510   O2SAT 98.0 06/14/2012 0510     Lab Results  Component Value Date   TSH 1.758 09/05/2012   BNP (last 3 results)  Recent Labs  09/05/12 2334 12/16/12 1810  PROBNP 5229.0* 8774.0*   Cardiac Panel (last 3 results) No results found for this basename: CKTOTAL, CKMB, TROPONINI, RELINDX,  in the last 72 hours  Iron/TIBC/Ferritin    Component Value Date/Time   IRON 14* 05/29/2013 1814   TIBC 194* 05/29/2013 1814   FERRITIN 243 06/11/2012 0728     EKG Orders placed during the hospital encounter of 05/28/13  . ED EKG  . ED EKG  . EKG 12-LEAD  . EKG 12-LEAD  . EKG 12-LEAD  . EKG  . EKG 12-LEAD  . EKG 12-LEAD  . EKG 12-LEAD  . EKG 12-LEAD  . EKG 12-LEAD  . EKG 12-LEAD  . EKG 12-LEAD     Prior Assessment and Plan Problem List as of 08/14/2013   Anemia of chronic kidney failure   Secondary hyperparathyroidism   Coronary atherosclerosis of native coronary artery   Last Assessment & Plan   05/29/2012 Office Visit Written 05/29/2012 10:04 AM by Donney Dice, PA     Will increase Imdur to 60 mg daily, and schedule early followup with me in 2 weeks for continued close monitoring. If we're unable to control her CP with medications, then we will need to consider a repeat cardiac catheterization. The results of the recent pharmacologic nuclear  imaging study did suggest mild peri-infarct ischemia. I suspect that her underlying anemia may be a precipitant, particularly when her hemoglobin drops quite low as it has in the recent past. She did note some transient gross hematuria yesterday, and reported this both to Drs. Nadara Mustard and Coolin. She otherwise denies any other evidence of overt bleeding. She states that she has routine blood work drawn during dialysis.    Essential hypertension, benign   Last Assessment & Plan   12/12/2012 Office Visit Written 12/12/2012  3:30 PM by Donney Dice, PA-C     Patient is on multimodal antihypertensive therapy with 5 medications. Two days ago, her clonidine dose was increased from 0.2 to 0.3 mg daily, by Dr. Pleas Koch. When I last saw her in March, she complained that her BP was much better controlled on labetalol, which she had been on prior to undergoing CABG in December 2013, at [facility], and which was not resumed at time of discharge. I advised her to confer with Drs. Befekadu and Nadara Mustard, the latter initially placing her on this medication. She returns today having since been placed back on labetalol at 100 mg 3 times a day. Given the complexity of her anti-hypertensive regimen, and that she is a hemodialysis patient, I recommended that she follow up with Dr. Lowanda Foster, regarding continued close monitoring and management of her HTN. And given her recent adjustment of her clonidine  dose, I elected to not make any additional modifications of her antihypertensive regimen. My bias would be that she be essentially followed by 1 of her many M.D.s, regarding future management of her HTN. Therefore, we will arrange an early followup appointment with Dr. Lowanda Foster within the next several days. In the meanwhile, she is to maintain a daily log of her BP readings, at least twice daily, with the morning reading to be taken before her medications.    ESRD on hemodialysis   Last Assessment & Plan   05/29/2012 Office Visit Written  05/29/2012 10:06 AM by Donney Dice, PA     Followed by Dr. Lowanda Foster    HLD (hyperlipidemia)   Last Assessment & Plan   09/20/2012 Office Visit Written 09/20/2012  1:38 PM by Donney Dice, PA-C     Will reassess lipid status with a FLP. Recommend target LDL 70 or less, if feasible. Continue current low-dose Lipitor, pending review of results.    GERD (gastroesophageal reflux disease)   Non-STEMI (non-ST elevated myocardial infarction)   Pneumonia   Mitral regurgitation   Hx of CABG   Last Assessment & Plan   09/20/2012 Office Visit Written 09/20/2012  1:35 PM by Donney Dice, PA-C     Quiescent on current medication regimen.    Carotid artery disease   Occlusion and stenosis of carotid artery without mention of cerebral infarction   Vaginal odor   Vaginal discharge   Abdominal  pain, other specified site   GI bleeding   Acute blood loss anemia   Chronic systolic CHF (congestive heart failure)   Small bowel obstruction, partial       Imaging: No results found.

## 2013-08-14 NOTE — Patient Instructions (Signed)
Your physician recommends that you schedule a follow-up appointment in: 1 month with Dr Bronson Ing  Your physician recommends that you continue on your current medications as directed. Please refer to the Current Medication list given to you today.

## 2013-08-14 NOTE — Assessment & Plan Note (Signed)
Doubt symptoms are related to ACS or cardiac etiology at this time. She states that her cardiac pain is more of a pressure with radiation to her left arm with profound weakness. This is more of a sticking pain. It is associated with muscled cramps in her hands during dialysis. Will not plan and cardiac testing. Reassurance is given. She has had labs drawn by nephrology today during dialysis. She is to have results discussed on Friday during next treatment. Will see her again in one month unless symptoms progress or worsen.

## 2013-08-14 NOTE — Progress Notes (Signed)
HPI: Shawna [name] is a 73 year old patient of Shawna [name] we are following for ongoing assessment and management of CAD, status post coronary artery bypass grafting, ischemic cardiomyopathy, hypertension, and chronic diastolic CHF, ESRD on dialysis. The patient was last seen by Shawna [name] in December of 2014. At that time she was found to be symptomatically stable. She was continued on current medication regimen and advised to restart statin. The patient was restarted on labetalol 100 mg twice a day due to elevated blood pressure. She had been on multiple medications in the past include amlodipine clonidine hydralazine and labetalol. At the time of that office visit the patient was only taking when necessary clonidine.    She comes today with complaints of weakness and chest pain during dialysis today. She describes a "sticking pain" bilaterally during dialysis. She also experienced some cramping in her hands. Once dialysis was completed, symptoms resolved, but she remains sore in her chest. She has become concerned that this is cardiac related and has requested add-on appt today.   Allergies  Allergen Reactions  . Aspirin Other (See Comments)    Mess up her stomach; "makes my bowels have blood in them". Takes 81 mg EC Aspirin   . Contrast Media [Iodinated Diagnostic Agents] Itching  . Iron Itching and Other (See Comments)    "they gave me iron in dialysis; had to give me Benadryl cause I had to have the iron" (05/02/2012)  . Macrodantin [Nitrofurantoin Macrocrystal] Other (See Comments)    "broke me out in big old knots all over my body; had to go to ER"  . Penicillins Other (See Comments)    "makes me real weak when I take it; like I'll pass out"  . Plavix [Clopidogrel Bisulfate] Rash  . Bactrim [Sulfamethoxazole-Trimethoprim] Rash  . Sulfa Antibiotics Rash  . Venofer [Ferric Oxide] Itching    Patient reports using Benadryl prior to doses as Shawna [name]  . Dexilant  [Dexlansoprazole] Other (See Comments)    Upset stomach  . Protonix [Pantoprazole Sodium] Rash    Current Outpatient Prescriptions  Medication Sig Dispense Refill  . albuterol (PROVENTIL HFA;VENTOLIN HFA) 108 (90 BASE) MCG/ACT inhaler Inhale 2 puffs into the lungs every 6 (six) hours as needed for wheezing or shortness of breath.       . ALPRAZolam (XANAX) 0.5 MG tablet Take 0.5 mg by mouth 3 (three) times daily.      . cloNIDine (CATAPRES) 0.2 MG tablet Take 1 tablet (0.2 mg total) by mouth as needed.  30 tablet  1  . fluticasone (FLONASE) 50 MCG/ACT nasal spray Place 2 sprays into the nose daily.      . folic acid (FOLVITE) 1 MG tablet TAKE 1 BY MOUTH DAILY      -E. SERPE, NP  30 tablet  5  . folic acid-vitamin b complex-vitamin c-selenium-zinc (DIALYVITE) 3 MG TABS Take 1 tablet by mouth daily.      . iron polysaccharides (NIFEREX) 150 MG capsule Take 150 mg by mouth daily.      Marland Kitchen labetalol (NORMODYNE) 100 MG tablet Take 1 tablet (100 mg total) by mouth 2 (two) times daily.  60 tablet  6  . lanthanum (FOSRENOL) 1000 MG chewable tablet Chew 1,000 mg by mouth 3 (three) times daily after meals.      . lidocaine-prilocaine (EMLA) cream Apply 1 application topically every Monday, Wednesday, and Friday.       . losartan (COZAAR) 100 MG tablet TAKE 1 BY MOUTH DAILY  30 tablet  5  . nitroGLYCERIN (NITROSTAT) 0.4 MG SL tablet Place 0.4 mg under the tongue every 5 (five) minutes as needed for chest pain.      Marland Kitchen omeprazole (PRILOSEC) 40 MG capsule TAKE ONE CAPSULE BY MOUTH ONCE DAILY  30 capsule  5  . polyethylene glycol (MIRALAX / GLYCOLAX) packet Take 17 g by mouth daily as needed for mild constipation.      . SENSIPAR 30 MG tablet Take 30 mg by mouth daily.       Marland Kitchen tobramycin-dexamethasone (TOBRADEX) ophthalmic solution Place 1 drop into the left eye 4 (four) times daily.       . traMADol (ULTRAM) 50 MG tablet Take 50 mg by mouth every 6 (six) hours as needed for pain.       Marland Kitchen zolpidem (AMBIEN) 10  MG tablet       . aspirin 81 MG chewable tablet Chew 1 tablet (81 mg total) by mouth daily.      Marland Kitchen loratadine (CLARITIN) 10 MG tablet Take 10 mg by mouth daily.        No current facility-administered medications for this visit.    Past Medical History  Diagnosis Date  . ESRD on hemodialysis     ESRD due to HTN, started dialysis 2011 and gets HD at [facility] with Dr Shawna [name] on MWF schedule.  Access is LUA AVF as of Sept 2014.   Marland Kitchen Chronic bronchitis   . GERD (gastroesophageal reflux disease)   . PUD (peptic ulcer disease)   . History of lower GI bleeding   . Arthritis   . History of gout   . CAD (coronary artery disease)     a. 12/2011 NSTEMI/Cath/PCI LCX (2.25x14 Resolute DES) & D1 (2.25x22 Resolute DES);  b. 01/2012 Cath/PCI: LM 30, LAD 30p, 40-66m, D1 stent ok, 99 in sm branch of diag, LCX patent stent, OM1 20, RCA 95 ost (4.0x12 Promus DES), EF 55%;  c. 04/2012 Lexi Cardiolite  EF 48%, small area of scar @ base/mid inflat wall with mild peri-infarct ischemia.; CABG 12/4  . High cholesterol 12/2011  . Pneumonia ~ 2009  . Iron deficiency anemia   . TIA (transient ischemic attack)   . Anxiety   . History of blood transfusion 07/2011; 12/2011; 01/2012 X 2; 04/2012  . Carotid artery disease     a. [id number] LICA, [id number]   . Mitral regurgitation     a. Moderate by echo, 02/2012  . Myocardial infarction   . Ovarian cancer 1992  . Colon cancer 1992  . Chronic diastolic CHF (congestive heart failure)     a. 02/2012 Echo EF 60-65%, nl wall motion, Gr 1 DD, mod MR  . Hypertension   . AVM (arteriovenous malformation) of colon   . Esophageal stricture     Past Surgical History  Procedure Laterality Date  . Abdominal hysterectomy  1992  . Appendectomy  06/1990  . Tubal ligation  1980's  . Av fistula placement  07/2009    left upper arm  . Thrombectomy / arteriovenous graft revision  2011    left upper arm  . Colon resection  1992  . Esophagogastroduodenoscopy  01/20/2012    Procedure:  ESOPHAGOGASTRODUODENOSCOPY (EGD);  Surgeon: Ladene Artist, MD,FACG;  Location: [facility] ENDOSCOPY;  Service: Endoscopy;  Laterality: N/A;  . Dilation and curettage of uterus    . Coronary angioplasty with stent placement  12/15/11    "2"  . Coronary angioplasty with stent placement  y/2013    "  1; makes total of 3" (05/02/2012)  . Coronary artery bypass graft  06/13/2012    Procedure: CORONARY ARTERY BYPASS GRAFTING (CABG);  Surgeon: Grace Isaac, MD;  Location: [place];  Service: Open Heart Surgery;  Laterality: N/A;  cabg x four;  using left internal mammary artery, and left leg greater saphenous vein harvested endoscopically  . Intraoperative transesophageal echocardiogram  06/13/2012    Procedure: INTRAOPERATIVE TRANSESOPHAGEAL ECHOCARDIOGRAM;  Surgeon: Grace Isaac, MD;  Location: [place];  Service: Open Heart Surgery;  Laterality: N/A;  . Colectomy  1992  . Esophagogastroduodenoscopy N/A 03/26/2013    Procedure: ESOPHAGOGASTRODUODENOSCOPY (EGD);  Surgeon: Irene Shipper, MD;  Location[facility] ENDOSCOPY;  Service: Endoscopy;  Laterality: N/A;  . Stomach surgery      ROS: Review of systems complete and found to be negative unless listed above  PHYSICAL EXAM BP 140/65  Pulse 81  Ht 5\' 1"  (1.549 m)  Wt 143 lb (64.864 kg)  BMI 27.03 kg/m2  General: Well developed, well nourished, in no acute distress Head: Eyes PERRLA, No xanthomas.   Normal cephalic and atramatic  Lungs: Clear bilaterally to auscultation and percussion. Heart: HRRR S1 S2, 1/6 systolic murmur at at the RSB. Pulses are 2+ & equal.            No carotid bruit. No JVD.  No abdominal bruits. No femoral bruits. Abdomen: Bowel sounds are positive, abdomen soft and non-tender without masses or                  Hernia's noted. Msk:  Back normal, normal gait. Normal strength and tone for age. Extremities: No clubbing, cyanosis or edema.  DP +1 Neuro: Alert and oriented X 3. Psych:  Good affect, responds appropriately  EKG: NSR  with rate of 79 bpm. LVH with prolonged Qtc of 488 ms. (Unchanged from EKG in 05/2013 with the exception of QTc which is improved on today's tracing).  ASSESSMENT AND PLAN

## 2013-08-14 NOTE — Assessment & Plan Note (Signed)
Blood pressure is well controlled currently.  Will continue current medication regimen. She will follow up with Dr. Bronson Ing in one month.

## 2013-09-20 ENCOUNTER — Encounter: Payer: Self-pay | Admitting: Cardiovascular Disease

## 2013-09-20 ENCOUNTER — Ambulatory Visit (INDEPENDENT_AMBULATORY_CARE_PROVIDER_SITE_OTHER): Payer: Medicare Other | Admitting: Cardiovascular Disease

## 2013-09-20 VITALS — BP 182/78 | HR 77 | Ht 64.0 in | Wt 143.0 lb

## 2013-09-20 DIAGNOSIS — Z79899 Other long term (current) drug therapy: Secondary | ICD-10-CM

## 2013-09-20 DIAGNOSIS — I10 Essential (primary) hypertension: Secondary | ICD-10-CM

## 2013-09-20 DIAGNOSIS — I059 Rheumatic mitral valve disease, unspecified: Secondary | ICD-10-CM

## 2013-09-20 DIAGNOSIS — Z951 Presence of aortocoronary bypass graft: Secondary | ICD-10-CM

## 2013-09-20 DIAGNOSIS — I25810 Atherosclerosis of coronary artery bypass graft(s) without angina pectoris: Secondary | ICD-10-CM

## 2013-09-20 DIAGNOSIS — I340 Nonrheumatic mitral (valve) insufficiency: Secondary | ICD-10-CM

## 2013-09-20 DIAGNOSIS — E7800 Pure hypercholesterolemia, unspecified: Secondary | ICD-10-CM

## 2013-09-20 DIAGNOSIS — N186 End stage renal disease: Secondary | ICD-10-CM

## 2013-09-20 DIAGNOSIS — E785 Hyperlipidemia, unspecified: Secondary | ICD-10-CM

## 2013-09-20 MED ORDER — SIMVASTATIN 20 MG PO TABS: 20.0000 mg | ORAL_TABLET | Freq: Every day | ORAL | Status: DC

## 2013-09-20 NOTE — Progress Notes (Signed)
Patient ID: Shawna [name], female   DOB: [birth year], 73 y.o.   MRN: [id number]      SUBJECTIVE: Shawna [name] is here for routine cardiovascular follow up. She is doing well. She denies heart-related pain but has occasionally experienced left breast pain. She's not noticed any swelling or rashes in this area. Her BP has been well controlled at home. She just came from dialysis and has not taken any of her antihypertensives. If she exerts herself, she will experience chest pressure and her BP is invariably elevated during these times. She denies palpitations and lightheadedness.    Allergies  Allergen Reactions  . Aspirin Other (See Comments)    Mess up her stomach; "makes my bowels have blood in them". Takes 81 mg EC Aspirin   . Contrast Media [Iodinated Diagnostic Agents] Itching  . Iron Itching and Other (See Comments)    "they gave me iron in dialysis; had to give me Benadryl cause I had to have the iron" (05/02/2012)  . Macrodantin [Nitrofurantoin Macrocrystal] Other (See Comments)    "broke me out in big old knots all over my body; had to go to ER"  . Penicillins Other (See Comments)    "makes me real weak when I take it; like I'll pass out"  . Plavix [Clopidogrel Bisulfate] Rash  . Bactrim [Sulfamethoxazole-Trimethoprim] Rash  . Sulfa Antibiotics Rash  . Venofer [Ferric Oxide] Itching    Patient reports using Benadryl prior to doses as [facility]  . Dexilant [Dexlansoprazole] Other (See Comments)    Upset stomach  . Protonix [Pantoprazole Sodium] Rash    Current Outpatient Prescriptions  Medication Sig Dispense Refill  . albuterol (PROVENTIL HFA;VENTOLIN HFA) 108 (90 BASE) MCG/ACT inhaler Inhale 2 puffs into the lungs every 6 (six) hours as needed for wheezing or shortness of breath.       . ALPRAZolam (XANAX) 0.5 MG tablet Take 0.5 mg by mouth 3 (three) times daily.      Marland Kitchen aspirin 81 MG chewable tablet Chew 1 tablet (81 mg total) by mouth daily.      . cloNIDine (CATAPRES)  0.2 MG tablet Take 1 tablet (0.2 mg total) by mouth as needed.  30 tablet  1  . fluticasone (FLONASE) 50 MCG/ACT nasal spray Place 2 sprays into the nose daily.      . folic acid (FOLVITE) 1 MG tablet TAKE 1 BY MOUTH DAILY      -E. SERPE, NP  30 tablet  5  . folic acid-vitamin b complex-vitamin c-selenium-zinc (DIALYVITE) 3 MG TABS Take 1 tablet by mouth daily.      . iron polysaccharides (NIFEREX) 150 MG capsule Take 150 mg by mouth daily.      Marland Kitchen labetalol (NORMODYNE) 100 MG tablet Take 1 tablet (100 mg total) by mouth 2 (two) times daily.  60 tablet  6  . lanthanum (FOSRENOL) 1000 MG chewable tablet Chew 1,000 mg by mouth 3 (three) times daily after meals.      . lidocaine-prilocaine (EMLA) cream Apply 1 application topically every Monday, Wednesday, and Friday.       . loratadine (CLARITIN) 10 MG tablet Take 10 mg by mouth daily.       Marland Kitchen losartan (COZAAR) 100 MG tablet TAKE 1 BY MOUTH DAILY  30 tablet  5  . nitroGLYCERIN (NITROSTAT) 0.4 MG SL tablet Place 0.4 mg under the tongue every 5 (five) minutes as needed for chest pain.      Marland Kitchen omeprazole (PRILOSEC) 40 MG capsule  TAKE ONE CAPSULE BY MOUTH ONCE DAILY  30 capsule  5  . polyethylene glycol (MIRALAX / GLYCOLAX) packet Take 17 g by mouth daily as needed for mild constipation.      . SENSIPAR 30 MG tablet Take 30 mg by mouth daily.       Marland Kitchen tobramycin-dexamethasone (TOBRADEX) ophthalmic solution Place 1 drop into the left eye 4 (four) times daily.       . traMADol (ULTRAM) 50 MG tablet Take 50 mg by mouth every 6 (six) hours as needed for pain.       Marland Kitchen zolpidem (AMBIEN) 10 MG tablet        No current facility-administered medications for this visit.    Past Medical History  Diagnosis Date  . ESRD on hemodialysis     ESRD due to HTN, started dialysis 2011 and gets HD at [facility] with Dr Hinda Lenis on MWF schedule.  Access is LUA AVF as of Sept 2014.   Marland Kitchen Chronic bronchitis   . GERD (gastroesophageal reflux disease)   . PUD (peptic ulcer  disease)   . History of lower GI bleeding   . Arthritis   . History of gout   . CAD (coronary artery disease)     a. 12/2011 NSTEMI/Cath/PCI LCX (2.25x14 Resolute DES) & D1 (2.25x22 Resolute DES);  b. 01/2012 Cath/PCI: LM 30, LAD 30p, 40-35m, D1 stent ok, 99 in sm branch of diag, LCX patent stent, OM1 20, RCA 95 ost (4.0x12 Promus DES), EF 55%;  c. 04/2012 Lexi Cardiolite  EF 48%, small area of scar @ base/mid inflat wall with mild peri-infarct ischemia.; CABG 12/4  . High cholesterol 12/2011  . Pneumonia ~ 2009  . Iron deficiency anemia   . TIA (transient ischemic attack)   . Anxiety   . History of blood transfusion 07/2011; 12/2011; 01/2012 X 2; 04/2012  . Carotid artery disease     a. A999333 LICA, Q000111Q   . Mitral regurgitation     a. Moderate by echo, 02/2012  . Myocardial infarction   . Ovarian cancer 1992  . Colon cancer 1992  . Chronic diastolic CHF (congestive heart failure)     a. 02/2012 Echo EF 60-65%, nl wall motion, Gr 1 DD, mod MR  . Hypertension   . AVM (arteriovenous malformation) of colon   . Esophageal stricture     Past Surgical History  Procedure Laterality Date  . Abdominal hysterectomy  1992  . Appendectomy  06/1990  . Tubal ligation  1980's  . Av fistula placement  07/2009    left upper arm  . Thrombectomy / arteriovenous graft revision  2011    left upper arm  . Colon resection  1992  . Esophagogastroduodenoscopy  01/20/2012    Procedure: ESOPHAGOGASTRODUODENOSCOPY (EGD);  Surgeon: Ladene Artist, MD,FACG;  Location: [facility] ENDOSCOPY;  Service: Endoscopy;  Laterality: N/A;  . Dilation and curettage of uterus    . Coronary angioplasty with stent placement  12/15/11    "2"  . Coronary angioplasty with stent placement  y/2013    "1; makes total of 3" (05/02/2012)  . Coronary artery bypass graft  06/13/2012    Procedure: CORONARY ARTERY BYPASS GRAFTING (CABG);  Surgeon: Grace Isaac, MD;  Location: [place];  Service: Open Heart Surgery;  Laterality: N/A;  cabg x  four;  using left internal mammary artery, and left leg greater saphenous vein harvested endoscopically  . Intraoperative transesophageal echocardiogram  06/13/2012    Procedure: INTRAOPERATIVE TRANSESOPHAGEAL ECHOCARDIOGRAM;  Surgeon: Grace Isaac, MD;  Location: [place];  Service: Open Heart Surgery;  Laterality: N/A;  . Colectomy  1992  . Esophagogastroduodenoscopy N/A 03/26/2013    Procedure: ESOPHAGOGASTRODUODENOSCOPY (EGD);  Surgeon: Irene Shipper, MD;  Location: [facility] ENDOSCOPY;  Service: Endoscopy;  Laterality: N/A;  . Stomach surgery      History   Social History  . Marital Status: Married    Spouse Name: N/A    Number of Children: N/A  . Years of Education: N/A   Occupational History  . Not on file.   Social History Main Topics  . Smoking status: Never Smoker   . Smokeless tobacco: Never Used  . Alcohol Use: No  . Drug Use: No  . Sexual Activity: Yes   Other Topics Concern  . Not on file   Social History Narrative   Lives in [place], [place] with husband.  Dialysis pt - [facility].     Filed Vitals:   09/20/13 1443  Height: 5\' 4"  (1.626 m)  Weight: 143 lb (64.864 kg)   BP 182/78  Pulse 77    PHYSICAL EXAM General: NAD Neck: No JVD, no thyromegaly. Lungs: Clear to auscultation bilaterally with normal respiratory effort. CV: Nondisplaced PMI.  Regular rate and rhythm, normal S1/S2, no S3/S4, no murmur. No pretibial or periankle edema.  No carotid bruit.  Normal pedal pulses.  Abdomen: Soft, nontender, no hepatosplenomegaly, no distention.  Neurologic: Alert and oriented x 3.  Psych: Normal affect. Extremities: No clubbing or cyanosis.   ECG: reviewed and available in electronic records.      ASSESSMENT AND PLAN: 1. CAD s/p CABG: She is symptomatically stable. I will continue EC ASA 81 mg daily, and I have recommended she restart a statin, perhaps simvastatin. She has agreed to do so. I will start at 20 mg daily and check LFT's in one month.  2. HTN: Controlled on  amlodipine, clonidine, hydralazine, and labetalol and prn clonidine. 3. Hyperlipidemia: dietary modifications as noted in HPI (oatmeal, flax seed). I have recommended simvastatin, and as she has agreed to try it, I will start at 20 mg daily and check LFT's in one month.  4. Mitral regurgitation: it appears after CABG, this has been attenuated and she now has normal LV systolic function with only mild MR.  5. Carotid artery stenosis: continue surveillance monitoring.   Dispo: f/u 4 months.       Kate Sable, M.D., F.A.C.C.

## 2013-09-20 NOTE — Patient Instructions (Signed)
Your physician wants you to follow-up in: 4 months You will receive a reminder letter in the mail two months in advance. If you don't receive a letter, please call our office to schedule the follow-up appointment.   Your physician has recommended you make the following change in your medication:   START Simvastatin 20 mg daily   Please get blood work in one month (lft's)   Thank you for choosing [facility] !                Thank you for choosing [facility] !

## 2013-10-03 ENCOUNTER — Encounter (HOSPITAL_COMMUNITY): Payer: Self-pay | Admitting: Emergency Medicine

## 2013-10-03 ENCOUNTER — Emergency Department (HOSPITAL_COMMUNITY): Payer: Medicare Other

## 2013-10-03 DIAGNOSIS — Z85038 Personal history of other malignant neoplasm of large intestine: Secondary | ICD-10-CM | POA: Insufficient documentation

## 2013-10-03 DIAGNOSIS — Z951 Presence of aortocoronary bypass graft: Secondary | ICD-10-CM | POA: Insufficient documentation

## 2013-10-03 DIAGNOSIS — Z7982 Long term (current) use of aspirin: Secondary | ICD-10-CM | POA: Insufficient documentation

## 2013-10-03 DIAGNOSIS — Z8711 Personal history of peptic ulcer disease: Secondary | ICD-10-CM | POA: Insufficient documentation

## 2013-10-03 DIAGNOSIS — IMO0002 Reserved for concepts with insufficient information to code with codable children: Secondary | ICD-10-CM | POA: Insufficient documentation

## 2013-10-03 DIAGNOSIS — Z8673 Personal history of transient ischemic attack (TIA), and cerebral infarction without residual deficits: Secondary | ICD-10-CM | POA: Insufficient documentation

## 2013-10-03 DIAGNOSIS — F411 Generalized anxiety disorder: Secondary | ICD-10-CM | POA: Insufficient documentation

## 2013-10-03 DIAGNOSIS — E7800 Pure hypercholesterolemia, unspecified: Secondary | ICD-10-CM | POA: Insufficient documentation

## 2013-10-03 DIAGNOSIS — K219 Gastro-esophageal reflux disease without esophagitis: Secondary | ICD-10-CM | POA: Insufficient documentation

## 2013-10-03 DIAGNOSIS — I120 Hypertensive chronic kidney disease with stage 5 chronic kidney disease or end stage renal disease: Secondary | ICD-10-CM | POA: Insufficient documentation

## 2013-10-03 DIAGNOSIS — M129 Arthropathy, unspecified: Secondary | ICD-10-CM | POA: Insufficient documentation

## 2013-10-03 DIAGNOSIS — Z9861 Coronary angioplasty status: Secondary | ICD-10-CM | POA: Insufficient documentation

## 2013-10-03 DIAGNOSIS — Z880 Allergy status to penicillin: Secondary | ICD-10-CM | POA: Insufficient documentation

## 2013-10-03 DIAGNOSIS — I5032 Chronic diastolic (congestive) heart failure: Secondary | ICD-10-CM | POA: Insufficient documentation

## 2013-10-03 DIAGNOSIS — Z992 Dependence on renal dialysis: Secondary | ICD-10-CM | POA: Insufficient documentation

## 2013-10-03 DIAGNOSIS — I252 Old myocardial infarction: Secondary | ICD-10-CM | POA: Insufficient documentation

## 2013-10-03 DIAGNOSIS — N186 End stage renal disease: Secondary | ICD-10-CM | POA: Insufficient documentation

## 2013-10-03 DIAGNOSIS — J40 Bronchitis, not specified as acute or chronic: Secondary | ICD-10-CM | POA: Insufficient documentation

## 2013-10-03 DIAGNOSIS — Q2733 Arteriovenous malformation of digestive system vessel: Secondary | ICD-10-CM | POA: Insufficient documentation

## 2013-10-03 DIAGNOSIS — I2510 Atherosclerotic heart disease of native coronary artery without angina pectoris: Secondary | ICD-10-CM | POA: Insufficient documentation

## 2013-10-03 DIAGNOSIS — D509 Iron deficiency anemia, unspecified: Secondary | ICD-10-CM | POA: Insufficient documentation

## 2013-10-03 DIAGNOSIS — Z8701 Personal history of pneumonia (recurrent): Secondary | ICD-10-CM | POA: Insufficient documentation

## 2013-10-03 DIAGNOSIS — Z8543 Personal history of malignant neoplasm of ovary: Secondary | ICD-10-CM | POA: Insufficient documentation

## 2013-10-03 DIAGNOSIS — Z79899 Other long term (current) drug therapy: Secondary | ICD-10-CM | POA: Insufficient documentation

## 2013-10-03 IMAGING — CR DG CHEST 2V
2 series · 2 of 2 positions shown · non-contrast
Comparison: [DATE].

CLINICAL DATA: Cough.  Fever.

EXAM:
CHEST  2 VIEW

[w chest pa]
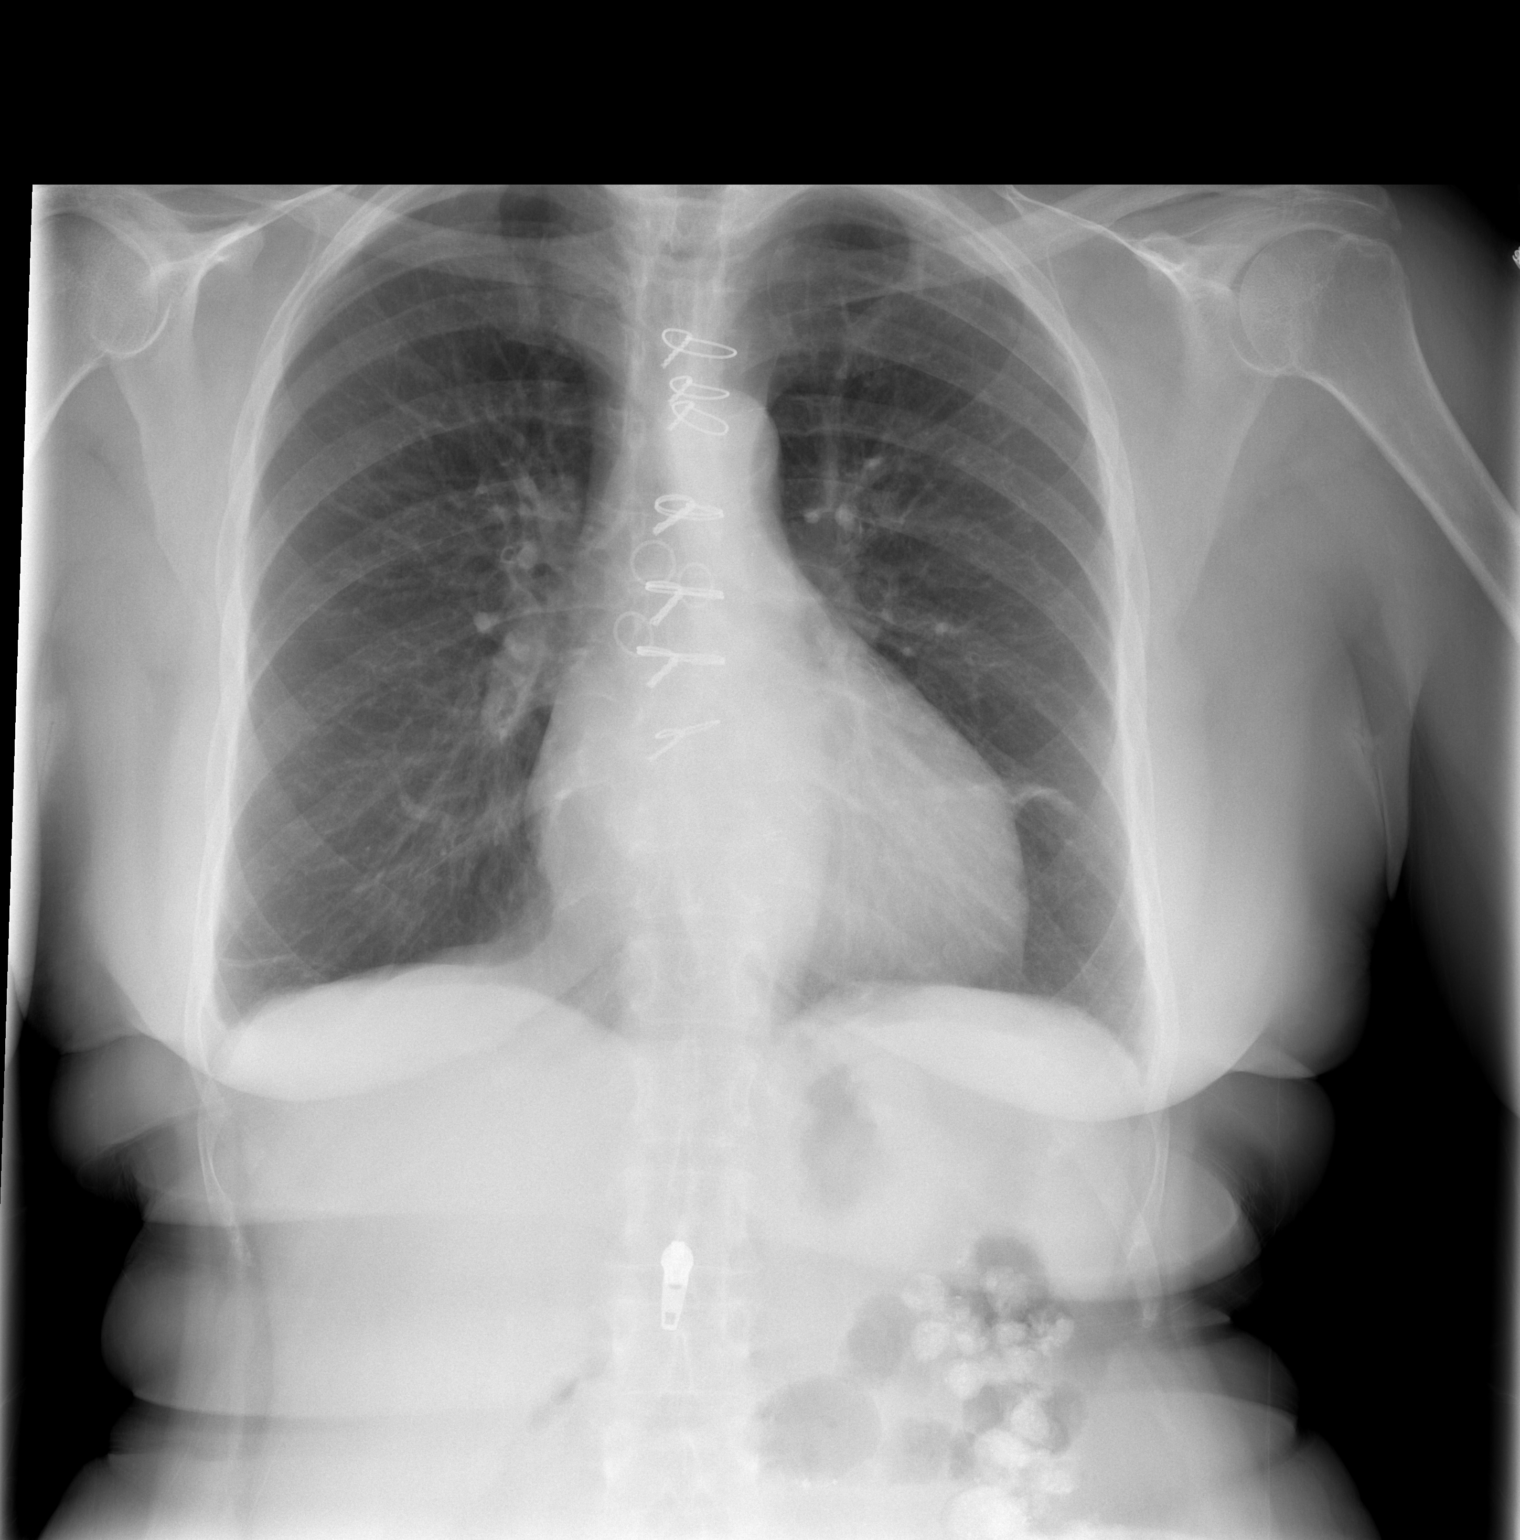

[w chest lat]
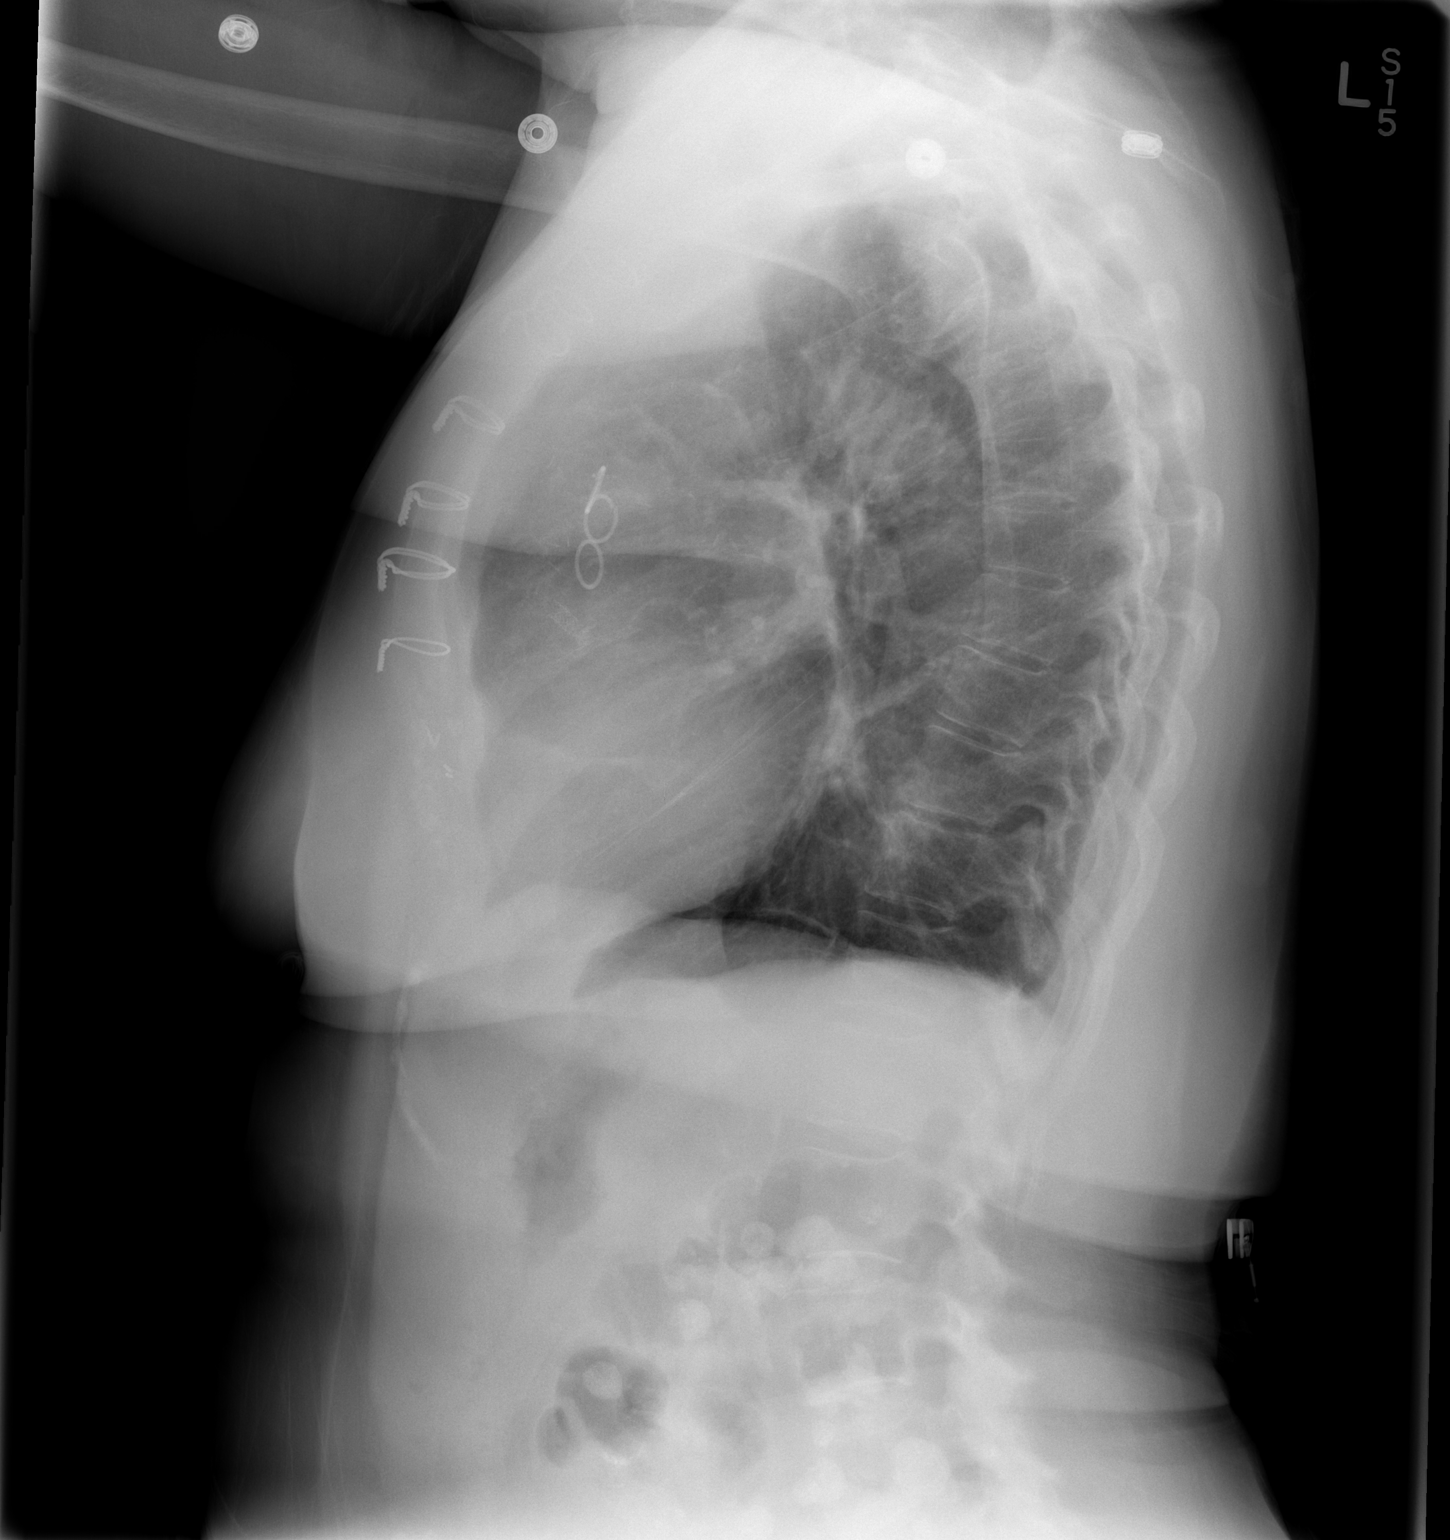

[2 of 2 positions shown; findings below may reference images not displayed]

FINDINGS: Stable normal sized heart, post CABG changes and Coronary artery
stent. Increased linear density in the left lower lung zone in the
lingula. Stable mild central peribronchial thickening. Interval
linear density at the right lateral lung base. Interval small
bilateral pleural effusions. Unremarkable upper abdomen. Diffuse
osteopenia.
IMPRESSION: 1. Interval small bilateral pleural effusions.
2. Mild bilateral lower lung zone linear atelectasis.
3. Stable mild chronic bronchitic changes.

## 2013-10-03 NOTE — ED Notes (Signed)
Pt. reports productive cough with fever onset today seen by her PCP ( Dr. Nadara Mustard ) prescribed with antibiotic for pneumonia .

## 2013-10-04 ENCOUNTER — Emergency Department (HOSPITAL_COMMUNITY)
Admission: EM | Admit: 2013-10-04 | Discharge: 2013-10-04 | Disposition: A | Discharge status: Home / Self Care | Payer: Medicare Other | Attending: Emergency Medicine | Admitting: Emergency Medicine

## 2013-10-04 DIAGNOSIS — R05 Cough: Secondary | ICD-10-CM

## 2013-10-04 DIAGNOSIS — J40 Bronchitis, not specified as acute or chronic: Secondary | ICD-10-CM

## 2013-10-04 DIAGNOSIS — R059 Cough, unspecified: Secondary | ICD-10-CM

## 2013-10-04 MED ORDER — ALBUTEROL SULFATE HFA 108 (90 BASE) MCG/ACT IN AERS
2.0000 | INHALATION_SPRAY | RESPIRATORY_TRACT | Status: DC
  Administered 2013-10-04: 2 via RESPIRATORY_TRACT
  Filled 2013-10-04: qty 6.7

## 2013-10-04 MED ORDER — ALBUTEROL SULFATE (2.5 MG/3ML) 0.083% IN NEBU
5.0000 mg | INHALATION_SOLUTION | Freq: Once | RESPIRATORY_TRACT | Status: AC
  Administered 2013-10-04: 5 mg via RESPIRATORY_TRACT
  Filled 2013-10-04: qty 6

## 2013-10-04 MED ORDER — BENZONATATE 100 MG PO CAPS: 100.0000 mg | ORAL_CAPSULE | Freq: Three times a day (TID) | ORAL | Status: DC

## 2013-10-04 NOTE — ED Notes (Signed)
Orders discontinued per MD verbal order.

## 2013-10-04 NOTE — ED Notes (Signed)
Pt reports a fever for two days, pt reports her temperature was as high as 100.6, pt unable to medicate herself as she was so nauseous she did not think she could keep it down.

## 2013-10-04 NOTE — ED Notes (Signed)
Phlebotomy and this RN unsuccessful at obtaining blood samples, MD informed.

## 2013-10-04 NOTE — ED Provider Notes (Signed)
CSN: [id number]     Arrival date & time 10/03/13  2122 History   First MD Initiated Contact with Patient 10/04/13 0012     Chief Complaint  Patient presents with  . Cough  . Fever      HPI Patient reports cough with associated fever over the past several days.  She was seen by her primary care physician today who prescribed her cefuroxime.  She presents the emergency department today because of ongoing cough.  She states the cough is affecting her ability to sleep.  She denies significant shortness of breath.  No fever at this time.  She denies nausea vomiting or diarrhea.  No recent sick contacts.  She is a dialysis patient and dialyzes Monday Wednesday Friday.  She has a normal run of dialysis on Wednesday, yesterday.  She has no other complaints at this time.   Past Medical History  Diagnosis Date  . ESRD on hemodialysis     ESRD due to HTN, started dialysis 2011 and gets HD at [facility] with Dr Hinda Lenis on MWF schedule.  Access is LUA AVF as of Sept 2014.   Marland Kitchen Chronic bronchitis   . GERD (gastroesophageal reflux disease)   . PUD (peptic ulcer disease)   . History of lower GI bleeding   . Arthritis   . History of gout   . CAD (coronary artery disease)     a. 12/2011 NSTEMI/Cath/PCI LCX (2.25x14 Resolute DES) & D1 (2.25x22 Resolute DES);  b. 01/2012 Cath/PCI: LM 30, LAD 30p, 40-82m, D1 stent ok, 99 in sm branch of diag, LCX patent stent, OM1 20, RCA 95 ost (4.0x12 Promus DES), EF 55%;  c. 04/2012 Lexi Cardiolite  EF 48%, small area of scar @ base/mid inflat wall with mild peri-infarct ischemia.; CABG 12/4  . High cholesterol 12/2011  . Pneumonia ~ 2009  . Iron deficiency anemia   . TIA (transient ischemic attack)   . Anxiety   . History of blood transfusion 07/2011; 12/2011; 01/2012 X 2; 04/2012  . Carotid artery disease     a. [id number] LICA, [id number]   . Mitral regurgitation     a. Moderate by echo, 02/2012  . Myocardial infarction   . Ovarian cancer 1992  . Colon cancer 1992  .  Chronic diastolic CHF (congestive heart failure)     a. 02/2012 Echo EF 60-65%, nl wall motion, Gr 1 DD, mod MR  . Hypertension   . AVM (arteriovenous malformation) of colon   . Esophageal stricture    Past Surgical History  Procedure Laterality Date  . Abdominal hysterectomy  1992  . Appendectomy  06/1990  . Tubal ligation  1980's  . Av fistula placement  07/2009    left upper arm  . Thrombectomy / arteriovenous graft revision  2011    left upper arm  . Colon resection  1992  . Esophagogastroduodenoscopy  01/20/2012    Procedure: ESOPHAGOGASTRODUODENOSCOPY (EGD);  Surgeon: Ladene Artist, MD,FACG;  Location: [facility] ENDOSCOPY;  Service: Endoscopy;  Laterality: N/A;  . Dilation and curettage of uterus    . Coronary angioplasty with stent placement  12/15/11    "2"  . Coronary angioplasty with stent placement  y/2013    "1; makes total of 3" (05/02/2012)  . Coronary artery bypass graft  06/13/2012    Procedure: CORONARY ARTERY BYPASS GRAFTING (CABG);  Surgeon: Grace Isaac, MD;  Location: [place];  Service: Open Heart Surgery;  Laterality: N/A;  cabg x four;  using left  internal mammary artery, and left leg greater saphenous vein harvested endoscopically  . Intraoperative transesophageal echocardiogram  06/13/2012    Procedure: INTRAOPERATIVE TRANSESOPHAGEAL ECHOCARDIOGRAM;  Surgeon: Grace Isaac, MD;  Location: [place];  Service: Open Heart Surgery;  Laterality: N/A;  . Colectomy  1992  . Esophagogastroduodenoscopy N/A 03/26/2013    Procedure: ESOPHAGOGASTRODUODENOSCOPY (EGD);  Surgeon: Irene Shipper, MD;  Location: [facility] ENDOSCOPY;  Service: Endoscopy;  Laterality: N/A;  . Stomach surgery     Family History  Problem Relation Age of Onset  . Other      noncontributory for early CAD  . Heart disease Mother     Heart Disease before age 14  . Hyperlipidemia Mother   . Hypertension Mother   . Diabetes Mother   . Heart attack Mother   . Heart disease Father     Heart Disease before age 90   . Hyperlipidemia Father   . Hypertension Father   . Diabetes Father   . Diabetes Sister   . Diabetes Brother   . Hyperlipidemia Brother    History  Substance Use Topics  . Smoking status: Never Smoker   . Smokeless tobacco: Never Used  . Alcohol Use: No   OB History   Grav Para Term Preterm Abortions TAB SAB Ect Mult Living                 Review of Systems  All other systems reviewed and are negative.      Allergies  Aspirin; Contrast media; Iron; Macrodantin; Penicillins; Plavix; Bactrim; Sulfa antibiotics; Venofer; Dexilant; and Protonix  Home Medications   Current Outpatient Rx  Name  Route  Sig  Dispense  Refill  . albuterol (PROVENTIL HFA;VENTOLIN HFA) 108 (90 BASE) MCG/ACT inhaler   Inhalation   Inhale 2 puffs into the lungs every 6 (six) hours as needed for wheezing or shortness of breath.          . ALPRAZolam (XANAX) 0.5 MG tablet   Oral   Take 0.5 mg by mouth 3 (three) times daily.         Marland Kitchen aspirin 81 MG chewable tablet   Oral   Chew 1 tablet (81 mg total) by mouth daily.         . cloNIDine (CATAPRES) 0.2 MG tablet   Oral   Take 1 tablet (0.2 mg total) by mouth as needed.   30 tablet   1     Nephrologist took patient off antihypertensive med ...   . fluticasone (FLONASE) 50 MCG/ACT nasal spray   Nasal   Place 2 sprays into the nose daily.         . folic acid (FOLVITE) 1 MG tablet      TAKE 1 BY MOUTH DAILY      -E. SERPE, NP   30 tablet   5   . folic acid-vitamin b complex-vitamin c-selenium-zinc (DIALYVITE) 3 MG TABS   Oral   Take 1 tablet by mouth daily.         . iron polysaccharides (NIFEREX) 150 MG capsule   Oral   Take 150 mg by mouth daily.         Marland Kitchen labetalol (NORMODYNE) 100 MG tablet   Oral   Take 1 tablet (100 mg total) by mouth 2 (two) times daily.   60 tablet   6   . lanthanum (FOSRENOL) 1000 MG chewable tablet   Oral   Chew 1,000 mg by mouth 3 (three) times daily after  meals.         .  lidocaine-prilocaine (EMLA) cream   Topical   Apply 1 application topically every Monday, Wednesday, and Friday.          . loratadine (CLARITIN) 10 MG tablet   Oral   Take 10 mg by mouth daily.          Marland Kitchen losartan (COZAAR) 100 MG tablet      TAKE 1 BY MOUTH DAILY   30 tablet   5   . nitroGLYCERIN (NITROSTAT) 0.4 MG SL tablet   Sublingual   Place 0.4 mg under the tongue every 5 (five) minutes as needed for chest pain.         Marland Kitchen omeprazole (PRILOSEC) 40 MG capsule      TAKE ONE CAPSULE BY MOUTH ONCE DAILY   30 capsule   5   . polyethylene glycol (MIRALAX / GLYCOLAX) packet   Oral   Take 17 g by mouth daily as needed for mild constipation.         . SENSIPAR 30 MG tablet   Oral   Take 30 mg by mouth daily.          . simvastatin (ZOCOR) 20 MG tablet   Oral   Take 1 tablet (20 mg total) by mouth at bedtime.   90 tablet   3   . tobramycin-dexamethasone (TOBRADEX) ophthalmic solution   Left Eye   Place 1 drop into the left eye 4 (four) times daily.          . traMADol (ULTRAM) 50 MG tablet   Oral   Take 50 mg by mouth every 6 (six) hours as needed for pain.          Marland Kitchen zolpidem (AMBIEN) 10 MG tablet                BP 162/65  Pulse 94  Temp(Src) 99.5 F (37.5 C) (Oral)  Resp 16  Ht 5\' 1"  (1.549 m)  Wt 146 lb (66.225 kg)  BMI 27.60 kg/m2  SpO2 98% Physical Exam  Nursing note and vitals reviewed. Constitutional: She is oriented to person, place, and time. She appears well-developed and well-nourished. No distress.  HENT:  Head: Normocephalic and atraumatic.  Eyes: EOM are normal.  Neck: Normal range of motion.  Cardiovascular: Normal rate, regular rhythm and normal heart sounds.   Pulmonary/Chest: Effort normal and breath sounds normal. She has no wheezes.  Abdominal: Soft. She exhibits no distension. There is no tenderness.  Musculoskeletal: Normal range of motion.  Neurological: She is alert and oriented to person, place, and time.   Skin: Skin is warm and dry.  Psychiatric: She has a normal mood and affect. Judgment normal.    ED Course  Procedures (including critical care time) Labs Review Labs Reviewed - No data to display Imaging Review Dg Chest 2 View  10/03/2013   CLINICAL DATA:  Cough.  Fever.  EXAM: CHEST  2 VIEW  COMPARISON:  05/31/2013.  FINDINGS: Stable normal sized heart, post CABG changes and Coronary artery stent. Increased linear density in the left lower lung zone in the lingula. Stable mild central peribronchial thickening. Interval linear density at the right lateral lung base. Interval small bilateral pleural effusions. Unremarkable upper abdomen. Diffuse osteopenia.  IMPRESSION: 1. Interval small bilateral pleural effusions. 2. Mild bilateral lower lung zone linear atelectasis. 3. Stable mild chronic bronchitic changes.   Electronically Signed   By: Enrique Sack M.D.   On: 10/03/2013  22:41     EKG Interpretation None      MDM   Final diagnoses:  Cough  Bronchitis    Patient feels much better after albuterol.  I suspect this is bronchitis with bronchospasm.  Discharge home in good condition.  PCP followup.    Hoy Morn, MD 10/04/13 [phone]

## 2013-10-04 NOTE — Discharge Instructions (Signed)
Bronchitis  Bronchitis is inflammation of the airways that extend from the windpipe into the lungs (bronchi). The inflammation often causes mucus to develop, which leads to a cough. If the inflammation becomes severe, it may cause shortness of breath.  CAUSES   Bronchitis may be caused by:    Viral infections.    Bacteria.    Cigarette smoke.    Allergens, pollutants, and other irritants.   SIGNS AND SYMPTOMS   The most common symptom of bronchitis is a frequent cough that produces mucus. Other symptoms include:   Fever.    Body aches.    Chest congestion.    Chills.    Shortness of breath.    Sore throat.   DIAGNOSIS   Bronchitis is usually diagnosed through a medical history and physical exam. Tests, such as chest X-rays, are sometimes done to rule out other conditions.   TREATMENT   You may need to avoid contact with whatever caused the problem (smoking, for example). Medicines are sometimes needed. These may include:   Antibiotics. These may be prescribed if the condition is caused by bacteria.   Cough suppressants. These may be prescribed for relief of cough symptoms.    Inhaled medicines. These may be prescribed to help open your airways and make it easier for you to breathe.    Steroid medicines. These may be prescribed for those with recurrent (chronic) bronchitis.  HOME CARE INSTRUCTIONS   Get plenty of rest.    Drink enough fluids to keep your urine clear or pale yellow (unless you have a medical condition that requires fluid restriction). Increasing fluids may help thin your secretions and will prevent dehydration.    Only take over-the-counter or prescription medicines as directed by your health care provider.   Only take antibiotics as directed. Make sure you finish them even if you start to feel better.   Avoid secondhand smoke, irritating chemicals, and strong fumes. These will make bronchitis worse. If you are a smoker, quit smoking. Consider using nicotine gum or  skin patches to help control withdrawal symptoms. Quitting smoking will help your lungs heal faster.    Put a cool-mist humidifier in your bedroom at night to moisten the air. This may help loosen mucus. Change the water in the humidifier daily. You can also run the hot water in your shower and sit in the bathroom with the door closed for 5 10 minutes.    Follow up with your health care provider as directed.    Wash your hands frequently to avoid catching bronchitis again or spreading an infection to others.   SEEK MEDICAL CARE IF:  Your symptoms do not improve after 1 week of treatment.   SEEK IMMEDIATE MEDICAL CARE IF:   Your fever increases.   You have chills.    You have chest pain.    You have worsening shortness of breath.    You have bloody sputum.   You faint.   You have lightheadedness.   You have a severe headache.    You vomit repeatedly.  MAKE SURE YOU:    Understand these instructions.   Will watch your condition.   Will get help right away if you are not doing well or get worse.  Document Released: 06/27/2005 Document Revised: 04/17/2013 Document Reviewed: 02/19/2013  Exit[employer] Patient Information 2014 [employer].

## 2013-10-09 ENCOUNTER — Other Ambulatory Visit: Payer: Self-pay

## 2013-10-09 DIAGNOSIS — T508X5A Adverse effect of diagnostic agents, initial encounter: Secondary | ICD-10-CM

## 2013-10-09 MED ORDER — DIPHENHYDRAMINE HCL 50 MG PO CAPS: ORAL_CAPSULE | ORAL | Status: DC

## 2013-10-09 MED ORDER — PREDNISONE 50 MG PO TABS: ORAL_TABLET | ORAL | Status: DC

## 2013-10-09 NOTE — Progress Notes (Signed)
Rec'd phone call from nurse @ [facility] reporting pt. Has allergy to contrast dye and has appt. For Fistulogram on 10/15/13.  Advised will send order to pharmacy for the 13 hr. Prednisone/ Benadryl prep to be taken at prescribed intervals prior to [facility]  Nurse at [facility] will inform pt. to pick- up medication at the pharmacy.

## 2013-10-10 ENCOUNTER — Other Ambulatory Visit: Payer: Self-pay

## 2013-10-15 ENCOUNTER — Encounter (HOSPITAL_COMMUNITY)
Admission: RE | Disposition: A | Discharge status: Home / Self Care | Payer: Self-pay | Source: Ambulatory Visit | Attending: Surgery

## 2013-10-15 ENCOUNTER — Ambulatory Visit (HOSPITAL_COMMUNITY)
Admission: RE | Admit: 2013-10-15 | Discharge: 2013-10-15 | Disposition: A | Discharge status: Home / Self Care | Payer: Medicare Other | Source: Ambulatory Visit | Attending: Surgery | Admitting: Surgery

## 2013-10-15 DIAGNOSIS — N186 End stage renal disease: Secondary | ICD-10-CM

## 2013-10-15 DIAGNOSIS — Z9861 Coronary angioplasty status: Secondary | ICD-10-CM | POA: Insufficient documentation

## 2013-10-15 DIAGNOSIS — I5032 Chronic diastolic (congestive) heart failure: Secondary | ICD-10-CM | POA: Insufficient documentation

## 2013-10-15 DIAGNOSIS — I252 Old myocardial infarction: Secondary | ICD-10-CM | POA: Insufficient documentation

## 2013-10-15 DIAGNOSIS — I059 Rheumatic mitral valve disease, unspecified: Secondary | ICD-10-CM | POA: Insufficient documentation

## 2013-10-15 DIAGNOSIS — D509 Iron deficiency anemia, unspecified: Secondary | ICD-10-CM | POA: Insufficient documentation

## 2013-10-15 DIAGNOSIS — I2510 Atherosclerotic heart disease of native coronary artery without angina pectoris: Secondary | ICD-10-CM | POA: Insufficient documentation

## 2013-10-15 DIAGNOSIS — F411 Generalized anxiety disorder: Secondary | ICD-10-CM | POA: Insufficient documentation

## 2013-10-15 DIAGNOSIS — Z8543 Personal history of malignant neoplasm of ovary: Secondary | ICD-10-CM | POA: Insufficient documentation

## 2013-10-15 DIAGNOSIS — Z7982 Long term (current) use of aspirin: Secondary | ICD-10-CM | POA: Insufficient documentation

## 2013-10-15 DIAGNOSIS — Z85038 Personal history of other malignant neoplasm of large intestine: Secondary | ICD-10-CM | POA: Insufficient documentation

## 2013-10-15 DIAGNOSIS — J42 Unspecified chronic bronchitis: Secondary | ICD-10-CM | POA: Insufficient documentation

## 2013-10-15 DIAGNOSIS — Z992 Dependence on renal dialysis: Secondary | ICD-10-CM | POA: Insufficient documentation

## 2013-10-15 DIAGNOSIS — Z8673 Personal history of transient ischemic attack (TIA), and cerebral infarction without residual deficits: Secondary | ICD-10-CM | POA: Insufficient documentation

## 2013-10-15 DIAGNOSIS — Y849 Medical procedure, unspecified as the cause of abnormal reaction of the patient, or of later complication, without mention of misadventure at the time of the procedure: Secondary | ICD-10-CM | POA: Insufficient documentation

## 2013-10-15 DIAGNOSIS — T82898A Other specified complication of vascular prosthetic devices, implants and grafts, initial encounter: Secondary | ICD-10-CM

## 2013-10-15 DIAGNOSIS — Z951 Presence of aortocoronary bypass graft: Secondary | ICD-10-CM | POA: Insufficient documentation

## 2013-10-15 DIAGNOSIS — K222 Esophageal obstruction: Secondary | ICD-10-CM | POA: Insufficient documentation

## 2013-10-15 DIAGNOSIS — K219 Gastro-esophageal reflux disease without esophagitis: Secondary | ICD-10-CM | POA: Insufficient documentation

## 2013-10-15 DIAGNOSIS — M109 Gout, unspecified: Secondary | ICD-10-CM | POA: Insufficient documentation

## 2013-10-15 DIAGNOSIS — E7800 Pure hypercholesterolemia, unspecified: Secondary | ICD-10-CM | POA: Insufficient documentation

## 2013-10-15 DIAGNOSIS — I6529 Occlusion and stenosis of unspecified carotid artery: Secondary | ICD-10-CM | POA: Insufficient documentation

## 2013-10-15 DIAGNOSIS — I509 Heart failure, unspecified: Secondary | ICD-10-CM | POA: Insufficient documentation

## 2013-10-15 DIAGNOSIS — I120 Hypertensive chronic kidney disease with stage 5 chronic kidney disease or end stage renal disease: Secondary | ICD-10-CM | POA: Insufficient documentation

## 2013-10-15 HISTORY — PX: SHUNTOGRAM: SHX5491

## 2013-10-15 LAB — POCT I-STAT, CHEM 8
BUN: 33 mg/dL — ABNORMAL HIGH (ref 6–23)
CREATININE: 8.3 mg/dL — AB (ref 0.50–1.10)
Calcium, Ion: 1.02 mmol/L — ABNORMAL LOW (ref 1.13–1.30)
Chloride: 99 mEq/L (ref 96–112)
GLUCOSE: 176 mg/dL — AB (ref 70–99)
HCT: 35 % — ABNORMAL LOW (ref 36.0–46.0)
HEMOGLOBIN: 11.9 g/dL — AB (ref 12.0–15.0)
Potassium: 4.2 mEq/L (ref 3.7–5.3)
SODIUM: 138 meq/L (ref 137–147)
TCO2: 26 mmol/L (ref 0–100)

## 2013-10-15 SURGERY — ASSESSMENT, SHUNT FUNCTION, WITH CONTRAST RADIOGRAPHIC STUDY
Anesthesia: LOCAL

## 2013-10-15 MED ORDER — GUAIFENESIN-DM 100-10 MG/5ML PO SYRP
15.0000 mL | ORAL_SOLUTION | ORAL | Status: DC | PRN
  Filled 2013-10-15: qty 15

## 2013-10-15 MED ORDER — LIDOCAINE HCL (PF) 1 % IJ SOLN
INTRAMUSCULAR | Status: AC
  Filled 2013-10-15: qty 30

## 2013-10-15 MED ORDER — METOPROLOL TARTRATE 1 MG/ML IV SOLN: 2.0000 mg | INTRAVENOUS | Status: DC | PRN

## 2013-10-15 MED ORDER — ACETAMINOPHEN 325 MG PO TABS
325.0000 mg | ORAL_TABLET | ORAL | Status: DC | PRN
  Filled 2013-10-15: qty 2

## 2013-10-15 MED ORDER — HEPARIN SODIUM (PORCINE) 1000 UNIT/ML IJ SOLN
INTRAMUSCULAR | Status: AC
  Filled 2013-10-15: qty 1

## 2013-10-15 MED ORDER — FENTANYL CITRATE 0.05 MG/ML IJ SOLN
INTRAMUSCULAR | Status: AC
  Filled 2013-10-15: qty 2

## 2013-10-15 MED ORDER — SODIUM CHLORIDE 0.9 % IJ SOLN: 3.0000 mL | INTRAMUSCULAR | Status: DC | PRN

## 2013-10-15 MED ORDER — PHENOL 1.4 % MT LIQD
1.0000 | OROMUCOSAL | Status: DC | PRN
  Filled 2013-10-15: qty 177

## 2013-10-15 MED ORDER — ACETAMINOPHEN 325 MG RE SUPP
325.0000 mg | RECTAL | Status: DC | PRN
  Filled 2013-10-15: qty 2

## 2013-10-15 MED ORDER — HEPARIN (PORCINE) IN NACL 2-0.9 UNIT/ML-% IJ SOLN
INTRAMUSCULAR | Status: AC
  Filled 2013-10-15: qty 500

## 2013-10-15 NOTE — H&P (Signed)
Patient name: Shawna [name] MRN: [id number] DOB: Aug 10, 1940 Sex: female    No chief complaint on file.   HISTORY OF PRESENT ILLNESS: The patient is here today for evaluation of her left upper arm fistula.  This was placed in a [place] approximately 4 years ago.  She has been having decreased flow rates.  She denies swelling.  Past Medical History  Diagnosis Date  . ESRD on hemodialysis     ESRD due to HTN, started dialysis 2011 and gets HD at [facility] with Dr Hinda Lenis on MWF schedule.  Access is LUA AVF as of Sept 2014.   Marland Kitchen Chronic bronchitis   . GERD (gastroesophageal reflux disease)   . PUD (peptic ulcer disease)   . History of lower GI bleeding   . Arthritis   . History of gout   . CAD (coronary artery disease)     a. 12/2011 NSTEMI/Cath/PCI LCX (2.25x14 Resolute DES) & D1 (2.25x22 Resolute DES);  b. 01/2012 Cath/PCI: LM 30, LAD 30p, 40-28m, D1 stent ok, 99 in sm branch of diag, LCX patent stent, OM1 20, RCA 95 ost (4.0x12 Promus DES), EF 55%;  c. 04/2012 Lexi Cardiolite  EF 48%, small area of scar @ base/mid inflat wall with mild peri-infarct ischemia.; CABG 12/4  . High cholesterol 12/2011  . Pneumonia ~ 2009  . Iron deficiency anemia   . TIA (transient ischemic attack)   . Anxiety   . History of blood transfusion 07/2011; 12/2011; 01/2012 X 2; 04/2012  . Carotid artery disease     a. A999333 LICA, [id number]   . Mitral regurgitation     a. Moderate by echo, 02/2012  . Myocardial infarction   . Ovarian cancer 1992  . Colon cancer 1992  . Chronic diastolic CHF (congestive heart failure)     a. 02/2012 Echo EF 60-65%, nl wall motion, Gr 1 DD, mod MR  . Hypertension   . AVM (arteriovenous malformation) of colon   . Esophageal stricture     Past Surgical History  Procedure Laterality Date  . Abdominal hysterectomy  1992  . Appendectomy  06/1990  . Tubal ligation  1980's  . Av fistula placement  07/2009    left upper arm  . Thrombectomy / arteriovenous graft  revision  2011    left upper arm  . Colon resection  1992  . Esophagogastroduodenoscopy  01/20/2012    Procedure: ESOPHAGOGASTRODUODENOSCOPY (EGD);  Surgeon: Ladene Artist, MD,FACG;  Location: [facility] ENDOSCOPY;  Service: Endoscopy;  Laterality: N/A;  . Dilation and curettage of uterus    . Coronary angioplasty with stent placement  12/15/11    "2"  . Coronary angioplasty with stent placement  y/2013    "1; makes total of 3" (05/02/2012)  . Coronary artery bypass graft  06/13/2012    Procedure: CORONARY ARTERY BYPASS GRAFTING (CABG);  Surgeon: Grace Isaac, MD;  Location: [place];  Service: Open Heart Surgery;  Laterality: N/A;  cabg x four;  using left internal mammary artery, and left leg greater saphenous vein harvested endoscopically  . Intraoperative transesophageal echocardiogram  06/13/2012    Procedure: INTRAOPERATIVE TRANSESOPHAGEAL ECHOCARDIOGRAM;  Surgeon: Grace Isaac, MD;  Location: [place];  Service: Open Heart Surgery;  Laterality: N/A;  . Colectomy  1992  . Esophagogastroduodenoscopy N/A 03/26/2013    Procedure: ESOPHAGOGASTRODUODENOSCOPY (EGD);  Surgeon: Irene Shipper, MD;  Location: [facility] ENDOSCOPY;  Service: Endoscopy;  Laterality: N/A;  . Stomach surgery  History   Social History  . Marital Status: Married    Spouse Name: N/A    Number of Children: N/A  . Years of Education: N/A   Occupational History  . Not on file.   Social History Main Topics  . Smoking status: Never Smoker   . Smokeless tobacco: Never Used  . Alcohol Use: No  . Drug Use: No  . Sexual Activity: Yes   Other Topics Concern  . Not on file   Social History Narrative   Lives in [place], [place] with husband.  Dialysis pt - [facility].    Family History  Problem Relation Age of Onset  . Other      noncontributory for early CAD  . Heart disease Mother     Heart Disease before age 23  . Hyperlipidemia Mother   . Hypertension Mother   . Diabetes Mother   . Heart attack Mother   . Heart disease  Father     Heart Disease before age 76  . Hyperlipidemia Father   . Hypertension Father   . Diabetes Father   . Diabetes Sister   . Diabetes Brother   . Hyperlipidemia Brother     Allergies as of 10/04/2013 - Review Complete 10/04/2013  Allergen Reaction Noted  . Aspirin Other (See Comments) 12/15/2011  . Contrast media [iodinated diagnostic agents] Itching 12/15/2011  . Iron Itching and Other (See Comments) 05/02/2012  . Macrodantin [nitrofurantoin macrocrystal] Other (See Comments) 12/15/2011  . Penicillins Other (See Comments) 12/15/2011  . Plavix [clopidogrel bisulfate] Rash 01/05/2012  . Bactrim [sulfamethoxazole-trimethoprim] Rash 12/15/2011  . Sulfa antibiotics Rash 12/15/2011  . Venofer [ferric oxide] Itching 05/02/2012  . Dexilant [dexlansoprazole] Other (See Comments) 05/29/2013  . Levaquin [levofloxacin in d5w] Rash 10/04/2013  . Protonix [pantoprazole sodium] Rash 03/21/2013    No current facility-administered medications on file prior to encounter.   Current Outpatient Prescriptions on File Prior to Encounter  Medication Sig Dispense Refill  . albuterol (PROVENTIL HFA;VENTOLIN HFA) 108 (90 BASE) MCG/ACT inhaler Inhale 2 puffs into the lungs every 6 (six) hours as needed for wheezing or shortness of breath.       . ALPRAZolam (XANAX) 0.5 MG tablet Take 0.5 mg by mouth 3 (three) times daily.      Marland Kitchen aspirin 81 MG chewable tablet Chew 1 tablet (81 mg total) by mouth daily.      . cloNIDine (CATAPRES) 0.2 MG tablet Take 1 tablet (0.2 mg total) by mouth as needed.  30 tablet  1  . fluticasone (FLONASE) 50 MCG/ACT nasal spray Place 2 sprays into the nose daily.      . folic acid (FOLVITE) 1 MG tablet Take 1 mg by mouth daily.      . folic acid-vitamin b complex-vitamin c-selenium-zinc (DIALYVITE) 3 MG TABS Take 1 tablet by mouth daily.      . iron polysaccharides (NIFEREX) 150 MG capsule Take 150 mg by mouth daily.      Marland Kitchen labetalol (NORMODYNE) 100 MG tablet Take 1 tablet  (100 mg total) by mouth 2 (two) times daily.  60 tablet  6  . lanthanum (FOSRENOL) 1000 MG chewable tablet Chew 1,000 mg by mouth 3 (three) times daily after meals.      . lidocaine-prilocaine (EMLA) cream Apply 1 application topically every Monday, Wednesday, and Friday.       . loratadine (CLARITIN) 10 MG tablet Take 10 mg by mouth daily.       Marland Kitchen losartan (COZAAR) 100 MG tablet Take  100 mg by mouth daily.      Marland Kitchen omeprazole (PRILOSEC) 40 MG capsule Take 40 mg by mouth daily.      . polyethylene glycol (MIRALAX / GLYCOLAX) packet Take 17 g by mouth daily as needed for mild constipation.      . SENSIPAR 30 MG tablet Take 30 mg by mouth daily.       . simvastatin (ZOCOR) 20 MG tablet Take 1 tablet (20 mg total) by mouth at bedtime.  90 tablet  3  . zolpidem (AMBIEN) 10 MG tablet Take 10 mg by mouth at bedtime as needed for sleep.       . benzonatate (TESSALON) 100 MG capsule Take 1 capsule (100 mg total) by mouth every 8 (eight) hours.  21 capsule  0  . cefUROXime (CEFTIN) 250 MG tablet Take 1 tablet by mouth 2 (two) times daily.      . nitroGLYCERIN (NITROSTAT) 0.4 MG SL tablet Place 0.4 mg under the tongue every 5 (five) minutes as needed for chest pain.      Marland Kitchen tobramycin-dexamethasone (TOBRADEX) ophthalmic solution Place 1 drop into the left eye 4 (four) times daily.       . traMADol (ULTRAM) 50 MG tablet Take 50 mg by mouth every 6 (six) hours as needed for pain.          REVIEW OF SYSTEMS: Cardiovascular: No chest pain, chest pressure, palpitations, orthopnea, or dyspnea on exertion. No claudication or rest pain,  No history of DVT or phlebitis. Pulmonary: No productive cough, asthma or wheezing. Neurologic: No weakness, paresthesias, aphasia, or amaurosis. No dizziness. Hematologic: No bleeding problems or clotting disorders. Musculoskeletal: No joint pain or joint swelling. Gastrointestinal: No blood in stool or hematemesis Genitourinary: No dysuria or hematuria. Psychiatric:: No  history of major depression. Integumentary: No rashes or ulcers. Constitutional: No fever or chills.  PHYSICAL EXAMINATION:   Vital signs are BP 196/86  Pulse 89  Temp(Src) 98 F (36.7 C) (Oral)  Resp 18  Ht 5\' 1"  (1.549 m)  Wt 143 lb (64.864 kg)  BMI 27.03 kg/m2  SpO2 98% General: The patient appears their stated age. HEENT:  No gross abnormalities Pulmonary:  Non labored breathing Musculoskeletal: There are no major deformities. Neurologic: No focal weakness or paresthesias are detected, Skin: There are no ulcer or rashes noted. Psychiatric: The patient has normal affect. Cardiovascular: thrill within fistula     Assessment: End stage renal disease Plan: We'll proceed with left upper extremity fistulogram and possible intervention as indicated.  Eldridge Abrahams, M.D. [facility] Office: [phone] Pager:  [phone]

## 2013-10-15 NOTE — Discharge Instructions (Signed)
AV Fistula °Care After °Refer to this sheet in the next few weeks. These instructions provide you with information on caring for yourself after your procedure. Your caregiver may also give you more specific instructions. Your treatment has been planned according to current medical practices, but problems sometimes occur. Call your caregiver if you have any problems or questions after your procedure. °HOME CARE INSTRUCTIONS  °· Do not drive a car or take public transportation alone. °· Do not drink alcohol. °· Only take medicine that has been prescribed by your caregiver. °· Do not sign important papers or make important decisions. °· Have a responsible person with you. °· Ask your caregiver to show you how to check your access at home for a vibration (called a "thrill") or for a sound (called a "bruit" pronounced brew-ee). °· Your vein will need time to enlarge and mature so needles can be inserted for dialysis. Follow your caregiver's instructions about what you need to do to make this happen. °· Keep dressings clean and dry. °· Keep the arm elevated above your heart. Use a pillow. °· Rest. °· Use the arm as usual for all activities. °· Have the stitches or tape closures removed in 10 to 14 days, or as directed by your caregiver. °· Do not sleep or lie on the area of the fistula or that arm. This may decrease or stop the blood flow through your fistula. °· Do not allow blood pressures to be taken on this arm. °· Do not allow blood drawing to be done from the graft. °· Do not wear tight clothing around the access site or on the arm. °· Avoid lifting heavy objects with the arm that has the fistula. °· Do not use creams or lotions over the access site. °SEEK MEDICAL CARE IF:  °· You have a fever. °· You have swelling around the fistula that gets worse, or you have new pain. °· You have unusual bleeding at the fistula site or from any other area. °· You have pus or other drainage at the fistula site. °· You have skin  redness or red streaking on the skin around, above, or below the fistula site. °· Your access site feels warm. °· You have any flu-like symptoms. °SEEK IMMEDIATE MEDICAL CARE IF:  °· You have pain, numbness, or an unusual pale skin on the hand or on the side of your fistula. °· You have dizziness or weakness that you have not had before. °· You have shortness of breath. °· You have chest pain. °· Your fistula disconnects or breaks, and there is bleeding that cannot be easily controlled. °Call for local emergency medical help. Do not try to drive yourself to the hospital. °MAKE SURE YOU °· Understand these instructions. °· Will watch your condition. °· Will get help right away if you are not doing well or get worse. °Document Released: 06/27/2005 Document Revised: 09/19/2011 Document Reviewed: 12/15/2010 °ExitCare® Patient Information ©2014 [employer]. ° °

## 2013-10-15 NOTE — Op Note (Signed)
    Patient name: Shawna [name] MRN: [id number] DOB: 08-01-1940 Sex: female  10/15/2013 Pre-operative Diagnosis: End stage renal disease Post-operative diagnosis:  Same Surgeon:  Eldridge Abrahams Procedure Performed:  1.  ultrasound-guided access, left upper arm fistula  2.  fistulogram  3.  angioplasty, left cephalic vein x2  4.  followup x1    Indications:  The patient is having trouble with a flow rates with dialysis.  She comes in for further evaluation  Procedure:  The patient was identified in the holding area and taken to room 8.  The patient was then placed supine on the table and prepped and draped in the usual sterile fashion.  A time out was called.  Ultrasound was used to evaluate the fistula.  The vein was patent and compressible.  A digital ultrasound image was acquired.  The fistula was then accessed under ultrasound guidance using a micropuncture needle.  An 018 wire was then asvanced without resistance and a micropuncture sheath was placed.  Contrast injections were then performed through the sheath.  Findings:  The central venous system is widely patent.  There is approximately a 50-60% stenosis at the arterial venous anastomosis.  In the proximal cephalic vein just before it enters the deep system there is a high-grade, 90% stenosis.  There is a large collateral which emptied into the axillary vein.  Aneurysmal changes are seen in the midportion of the fistula.  Just beyond the aneurysmal changes there is approximately a 50-60% stenosis.   Intervention:  After the above images were obtained, the decision was made to proceed with intervention.  Over an 035 wire, a 6 French sheath was placed.  3000 units of heparin were administered.  I initially performed balloon angioplasty of the cephalic vein.  This was with a 4 x 40 Mustang balloon.  This was taken to burst pressure.  Followup imaging did not reveal a significant improvement.  Therefore I upsized to a 7 x 40 Mustang  balloon.  This time the balloon was taken to profile which correlated to approximately 15 atmospheres.  Completion imaging revealed slight evidence of extravasation but significantly improved blood flow across this area.  I therefore reinserted the balloon and inflated to profile which was about 5 atmospheres and held for 2 minutes.  I then withdrew the balloon to the area just beyond the aneurysmal changes and performed angioplasty in the midportion of the cephalic vein the balloon was taken to 15 atmospheres and held up for 1 minute.  Completion imaging was then performed.  There was a significantly improved blood flow across the area just beyond the aneurysmal changes.  In addition there is no evidence of extravasation in the proximal cephalic vein.  I elected to terminate the procedure this point in time.  Balloon and wires were removed.  The access site was closed with a suture.  There were no complications.  Impression:  #1  successful balloon angioplasty of a high grade, 90% stenosis within the proximal cephalic vein using a 7 mm balloon.  #2  successful balloon angioplasty of a 50-60% stenosis within the midportion of the cephalic vein using a 7 mm balloon  #3  no evidence of central venous stenosis  #4  approximately 50-60% stenosis at the arterial venous anastomosis  #5  the patient remained a candidate for percutaneous intervention   V. Annamarie Major, M.D. [facility] Office: [phone] Pager:  [phone]

## 2013-11-04 ENCOUNTER — Telehealth: Payer: Self-pay | Admitting: Surgery

## 2013-11-04 NOTE — Telephone Encounter (Signed)
Message copied by Gena Fray on Mon Nov 04, 2013  2:45 PM ------      Message from: [place], [place] K      Created: Thu Oct 31, 2013  6:16 PM      Regarding: RE: ? Follow Up       I don't think so, he did an angioplasty along with the fistulogram. Pt had flow velocity issues so the [facility] [place] probably would have called [place] by now if this procedure didn't fix the problem.             ----- Message -----         From: Gena Fray         Sent: 10/31/2013   8:56 AM           To: Mena Goes, CMA      Subject: ? Follow Up                                              Does this patient need follow up from procedure by [facility] on 10/15/13?            Thanks,      Hinton Dyer       ------

## 2013-11-13 ENCOUNTER — Telehealth: Payer: Self-pay | Admitting: Cardiovascular Disease

## 2013-11-13 MED ORDER — LABETALOL HCL 100 MG PO TABS: 150.0000 mg | ORAL_TABLET | Freq: Two times a day (BID) | ORAL | Status: DC

## 2013-11-13 NOTE — Telephone Encounter (Signed)
Pt made aware of dosage increase.

## 2013-11-13 NOTE — Telephone Encounter (Signed)
Around 75 to 80

## 2013-11-13 NOTE — Telephone Encounter (Signed)
Patient states that her BP is dropping during Dialysis. Please return pt phone call / tgs

## 2013-11-13 NOTE — Telephone Encounter (Signed)
What is the HR? I would consider increasing labetalol depending on this.

## 2013-11-13 NOTE — Telephone Encounter (Signed)
Pt states that blood pressure spikes up after dialysis. Pt's BP on Dialysis days are 190/200 over 80's/90's. Pt states she has to wait at the dialysis office for it to go down. On the days pt does not have dialysis her BP is around 160/90  Please advise if any changes.

## 2013-11-13 NOTE — Telephone Encounter (Signed)
Can try increasing labetalol to 150 mg bid.

## 2013-11-26 ENCOUNTER — Encounter (HOSPITAL_COMMUNITY): Payer: Self-pay | Admitting: Emergency Medicine

## 2013-11-26 ENCOUNTER — Telehealth: Payer: Self-pay | Admitting: *Deleted

## 2013-11-26 ENCOUNTER — Observation Stay (HOSPITAL_COMMUNITY)
Admission: EM | Admit: 2013-11-26 | Discharge: 2013-11-28 | Disposition: A | Discharge status: Home Health | Payer: Medicare Other | Attending: Internal Medicine | Admitting: Internal Medicine

## 2013-11-26 ENCOUNTER — Emergency Department (HOSPITAL_COMMUNITY): Payer: Medicare Other

## 2013-11-26 DIAGNOSIS — I509 Heart failure, unspecified: Secondary | ICD-10-CM | POA: Insufficient documentation

## 2013-11-26 DIAGNOSIS — Z85038 Personal history of other malignant neoplasm of large intestine: Secondary | ICD-10-CM | POA: Insufficient documentation

## 2013-11-26 DIAGNOSIS — Z79899 Other long term (current) drug therapy: Secondary | ICD-10-CM | POA: Insufficient documentation

## 2013-11-26 DIAGNOSIS — I779 Disorder of arteries and arterioles, unspecified: Secondary | ICD-10-CM

## 2013-11-26 DIAGNOSIS — N898 Other specified noninflammatory disorders of vagina: Secondary | ICD-10-CM

## 2013-11-26 DIAGNOSIS — I5032 Chronic diastolic (congestive) heart failure: Secondary | ICD-10-CM | POA: Insufficient documentation

## 2013-11-26 DIAGNOSIS — Z951 Presence of aortocoronary bypass graft: Secondary | ICD-10-CM

## 2013-11-26 DIAGNOSIS — N2581 Secondary hyperparathyroidism of renal origin: Secondary | ICD-10-CM

## 2013-11-26 DIAGNOSIS — I10 Essential (primary) hypertension: Secondary | ICD-10-CM

## 2013-11-26 DIAGNOSIS — K56600 Partial intestinal obstruction, unspecified as to cause: Secondary | ICD-10-CM

## 2013-11-26 DIAGNOSIS — I739 Peripheral vascular disease, unspecified: Secondary | ICD-10-CM

## 2013-11-26 DIAGNOSIS — R109 Unspecified abdominal pain: Secondary | ICD-10-CM

## 2013-11-26 DIAGNOSIS — M949 Disorder of cartilage, unspecified: Secondary | ICD-10-CM

## 2013-11-26 DIAGNOSIS — I5022 Chronic systolic (congestive) heart failure: Secondary | ICD-10-CM

## 2013-11-26 DIAGNOSIS — K922 Gastrointestinal hemorrhage, unspecified: Secondary | ICD-10-CM

## 2013-11-26 DIAGNOSIS — E785 Hyperlipidemia, unspecified: Secondary | ICD-10-CM

## 2013-11-26 DIAGNOSIS — I120 Hypertensive chronic kidney disease with stage 5 chronic kidney disease or end stage renal disease: Secondary | ICD-10-CM | POA: Insufficient documentation

## 2013-11-26 DIAGNOSIS — I2510 Atherosclerotic heart disease of native coronary artery without angina pectoris: Secondary | ICD-10-CM

## 2013-11-26 DIAGNOSIS — I5042 Chronic combined systolic (congestive) and diastolic (congestive) heart failure: Secondary | ICD-10-CM | POA: Diagnosis present

## 2013-11-26 DIAGNOSIS — I25810 Atherosclerosis of coronary artery bypass graft(s) without angina pectoris: Secondary | ICD-10-CM

## 2013-11-26 DIAGNOSIS — I252 Old myocardial infarction: Secondary | ICD-10-CM | POA: Insufficient documentation

## 2013-11-26 DIAGNOSIS — Z9861 Coronary angioplasty status: Secondary | ICD-10-CM | POA: Insufficient documentation

## 2013-11-26 DIAGNOSIS — I340 Nonrheumatic mitral (valve) insufficiency: Secondary | ICD-10-CM

## 2013-11-26 DIAGNOSIS — N039 Chronic nephritic syndrome with unspecified morphologic changes: Secondary | ICD-10-CM

## 2013-11-26 DIAGNOSIS — R197 Diarrhea, unspecified: Secondary | ICD-10-CM

## 2013-11-26 DIAGNOSIS — F411 Generalized anxiety disorder: Secondary | ICD-10-CM | POA: Insufficient documentation

## 2013-11-26 DIAGNOSIS — D62 Acute posthemorrhagic anemia: Secondary | ICD-10-CM

## 2013-11-26 DIAGNOSIS — N189 Chronic kidney disease, unspecified: Secondary | ICD-10-CM

## 2013-11-26 DIAGNOSIS — R079 Chest pain, unspecified: Principal | ICD-10-CM

## 2013-11-26 DIAGNOSIS — K219 Gastro-esophageal reflux disease without esophagitis: Secondary | ICD-10-CM

## 2013-11-26 DIAGNOSIS — M899 Disorder of bone, unspecified: Secondary | ICD-10-CM | POA: Insufficient documentation

## 2013-11-26 DIAGNOSIS — N186 End stage renal disease: Secondary | ICD-10-CM

## 2013-11-26 DIAGNOSIS — I6529 Occlusion and stenosis of unspecified carotid artery: Secondary | ICD-10-CM

## 2013-11-26 DIAGNOSIS — J189 Pneumonia, unspecified organism: Secondary | ICD-10-CM

## 2013-11-26 DIAGNOSIS — J42 Unspecified chronic bronchitis: Secondary | ICD-10-CM | POA: Insufficient documentation

## 2013-11-26 DIAGNOSIS — Z992 Dependence on renal dialysis: Secondary | ICD-10-CM

## 2013-11-26 DIAGNOSIS — I214 Non-ST elevation (NSTEMI) myocardial infarction: Secondary | ICD-10-CM

## 2013-11-26 DIAGNOSIS — D631 Anemia in chronic kidney disease: Secondary | ICD-10-CM

## 2013-11-26 DIAGNOSIS — Z8543 Personal history of malignant neoplasm of ovary: Secondary | ICD-10-CM | POA: Insufficient documentation

## 2013-11-26 DIAGNOSIS — Z7982 Long term (current) use of aspirin: Secondary | ICD-10-CM | POA: Insufficient documentation

## 2013-11-26 DIAGNOSIS — I517 Cardiomegaly: Secondary | ICD-10-CM

## 2013-11-26 DIAGNOSIS — N19 Unspecified kidney failure: Secondary | ICD-10-CM

## 2013-11-26 LAB — BASIC METABOLIC PANEL
BUN: 43 mg/dL — ABNORMAL HIGH (ref 6–23)
CO2: 32 mEq/L (ref 19–32)
Calcium: 8.3 mg/dL — ABNORMAL LOW (ref 8.4–10.5)
Chloride: 97 mEq/L (ref 96–112)
Creatinine, Ser: 8.74 mg/dL — ABNORMAL HIGH (ref 0.50–1.10)
GFR, EST AFRICAN AMERICAN: 5 mL/min — AB (ref >90)
GFR, EST NON AFRICAN AMERICAN: 4 mL/min — AB (ref >90)
Glucose, Bld: 89 mg/dL (ref 70–99)
POTASSIUM: 4.9 meq/L (ref 3.7–5.3)
SODIUM: 144 meq/L (ref 137–147)

## 2013-11-26 LAB — CBC WITH DIFFERENTIAL/PLATELET
BASOS PCT: 1 % (ref 0–1)
Basophils Absolute: 0.1 10*3/uL (ref 0.0–0.1)
EOS ABS: 0.4 10*3/uL (ref 0.0–0.7)
Eosinophils Relative: 7 % — ABNORMAL HIGH (ref 0–5)
HCT: 35.4 % — ABNORMAL LOW (ref 36.0–46.0)
Hemoglobin: 11.2 g/dL — ABNORMAL LOW (ref 12.0–15.0)
Lymphocytes Relative: 17 % (ref 12–46)
Lymphs Abs: 1 10*3/uL (ref 0.7–4.0)
MCH: 32.7 pg (ref 26.0–34.0)
MCHC: 31.6 g/dL (ref 30.0–36.0)
MCV: 103.2 fL — ABNORMAL HIGH (ref 78.0–100.0)
Monocytes Absolute: 0.4 10*3/uL (ref 0.1–1.0)
Monocytes Relative: 8 % (ref 3–12)
Neutro Abs: 3.8 10*3/uL (ref 1.7–7.7)
Neutrophils Relative %: 67 % (ref 43–77)
PLATELETS: 206 10*3/uL (ref 150–400)
RBC: 3.43 MIL/uL — ABNORMAL LOW (ref 3.87–5.11)
RDW: 15.2 % (ref 11.5–15.5)
WBC: 5.6 10*3/uL (ref 4.0–10.5)

## 2013-11-26 LAB — TROPONIN I
Troponin I: <0.3 ng/mL (ref <0.30)
Troponin I: <0.3 ng/mL (ref <0.30)
Troponin I: <0.3 ng/mL (ref <0.30)

## 2013-11-26 IMAGING — CR DG CHEST 1V PORT
1 series · 1 of 1 positions shown · non-contrast
Comparison: [DATE]

CLINICAL DATA: Left-sided chest pain

EXAM:
PORTABLE CHEST - 1 VIEW

[ap]
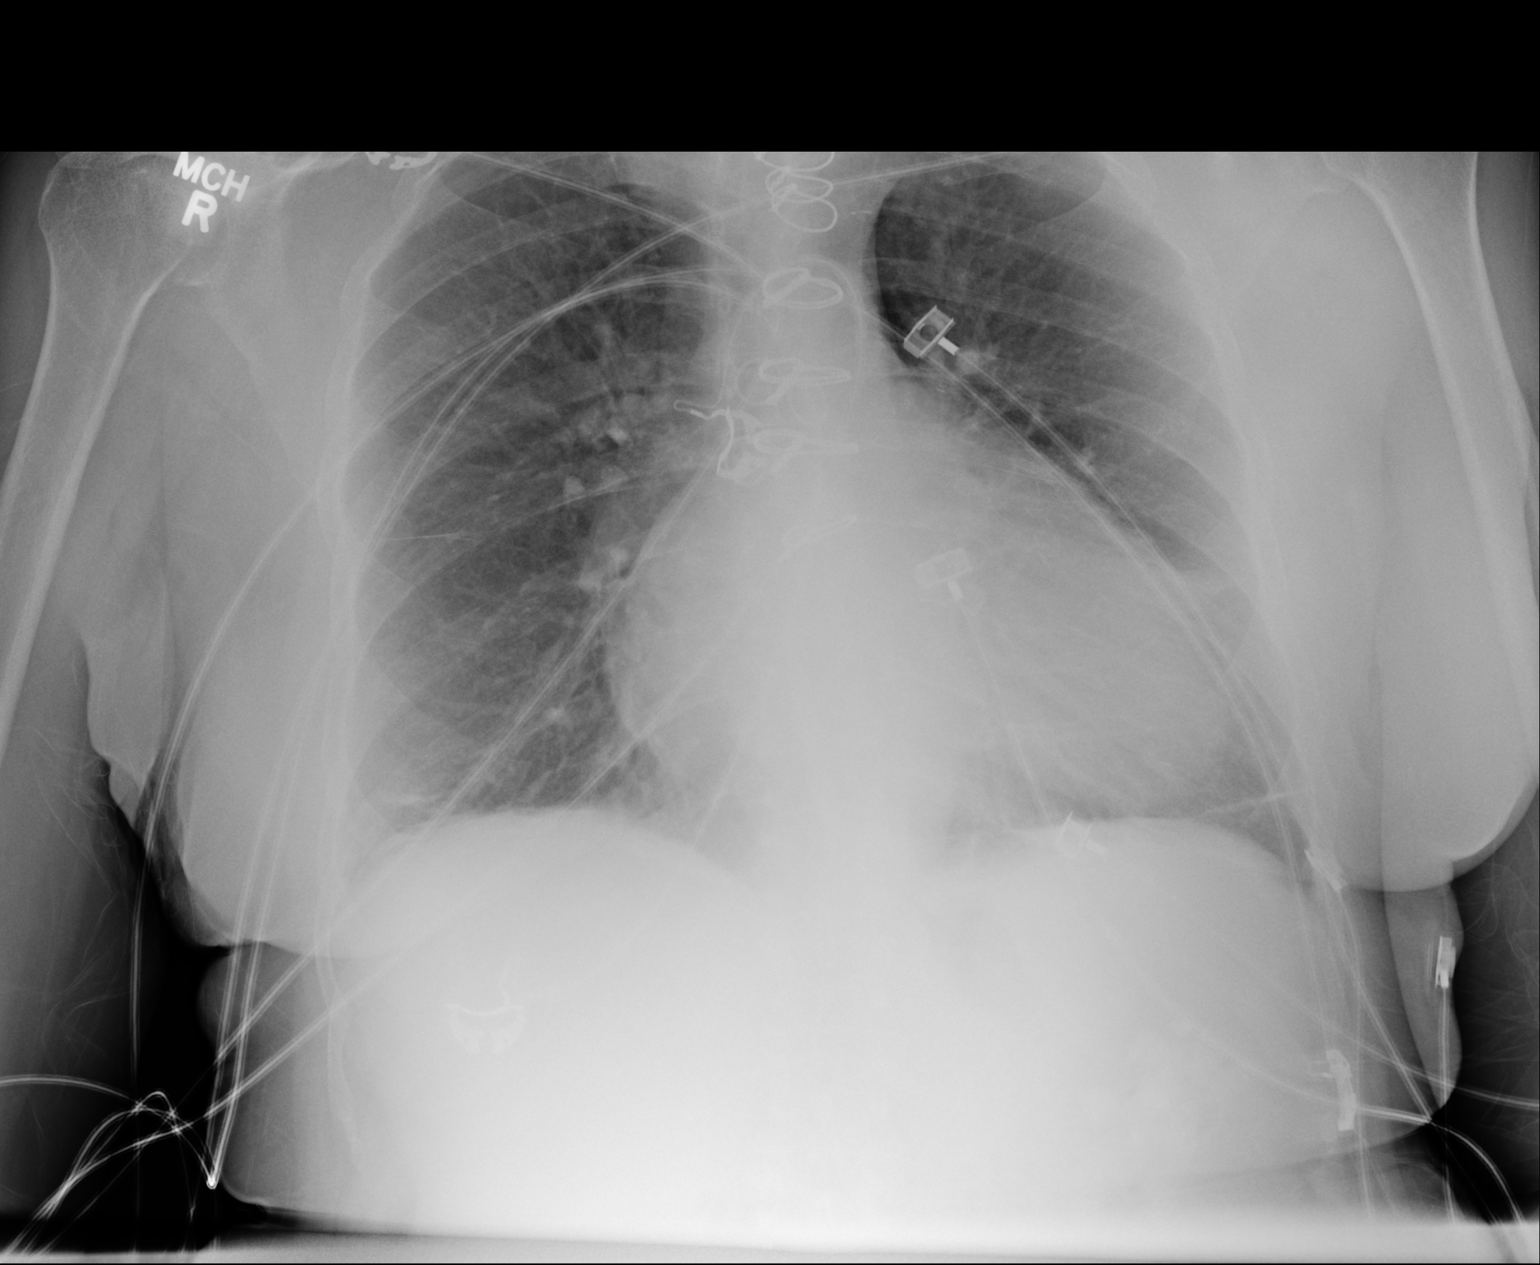

[1 of 1 positions shown; findings below may reference images not displayed]

FINDINGS: There are mild chronic bronchitic changes. There is mild
interstitial prominence which is accentuated by low lung volumes.
There is no focal parenchymal opacity, pleural effusion, or
pneumothorax. The heart size is top-normal. There is evidence of
prior CABG. The osseous structures are unremarkable.
IMPRESSION: No active disease.

## 2013-11-26 MED ORDER — ZOLPIDEM TARTRATE 5 MG PO TABS: 5.0000 mg | ORAL_TABLET | Freq: Every evening | ORAL | Status: DC | PRN

## 2013-11-26 MED ORDER — CINACALCET HCL 30 MG PO TABS
30.0000 mg | ORAL_TABLET | Freq: Every day | ORAL | Status: DC
  Administered 2013-11-27: 30 mg via ORAL
  Filled 2013-11-26 (×3): qty 1

## 2013-11-26 MED ORDER — HEPARIN SODIUM (PORCINE) 5000 UNIT/ML IJ SOLN
5000.0000 [IU] | Freq: Three times a day (TID) | INTRAMUSCULAR | Status: DC
  Administered 2013-11-26 – 2013-11-28 (×6): 5000 [IU] via SUBCUTANEOUS
  Filled 2013-11-26 (×6): qty 1

## 2013-11-26 MED ORDER — LABETALOL HCL 100 MG PO TABS
150.0000 mg | ORAL_TABLET | Freq: Two times a day (BID) | ORAL | Status: DC
  Administered 2013-11-26 – 2013-11-28 (×4): 150 mg via ORAL
  Filled 2013-11-26: qty 0.5
  Filled 2013-11-26: qty 1.5
  Filled 2013-11-26: qty 0.5
  Filled 2013-11-26 (×3): qty 1.5

## 2013-11-26 MED ORDER — SODIUM CHLORIDE 0.9 % IJ SOLN
3.0000 mL | Freq: Two times a day (BID) | INTRAMUSCULAR | Status: DC
  Administered 2013-11-26 – 2013-11-28 (×4): 3 mL via INTRAVENOUS

## 2013-11-26 MED ORDER — LOSARTAN POTASSIUM 50 MG PO TABS
100.0000 mg | ORAL_TABLET | Freq: Every day | ORAL | Status: DC
  Administered 2013-11-26 – 2013-11-28 (×3): 100 mg via ORAL
  Filled 2013-11-26 (×3): qty 2

## 2013-11-26 MED ORDER — ASPIRIN 81 MG PO CHEW
81.0000 mg | CHEWABLE_TABLET | Freq: Every day | ORAL | Status: DC
  Administered 2013-11-27 – 2013-11-28 (×2): 81 mg via ORAL
  Filled 2013-11-26 (×2): qty 1

## 2013-11-26 MED ORDER — MORPHINE SULFATE 2 MG/ML IJ SOLN: 2.0000 mg | Freq: Once | INTRAMUSCULAR | Status: DC

## 2013-11-26 MED ORDER — LORATADINE 10 MG PO TABS
10.0000 mg | ORAL_TABLET | Freq: Every day | ORAL | Status: DC
  Administered 2013-11-26 – 2013-11-28 (×3): 10 mg via ORAL
  Filled 2013-11-26 (×3): qty 1

## 2013-11-26 MED ORDER — ACETAMINOPHEN 650 MG RE SUPP: 650.0000 mg | Freq: Four times a day (QID) | RECTAL | Status: DC | PRN

## 2013-11-26 MED ORDER — ACETAMINOPHEN 325 MG PO TABS
650.0000 mg | ORAL_TABLET | Freq: Four times a day (QID) | ORAL | Status: DC | PRN
  Administered 2013-11-26: 650 mg via ORAL
  Filled 2013-11-26: qty 2

## 2013-11-26 MED ORDER — SIMVASTATIN 20 MG PO TABS
20.0000 mg | ORAL_TABLET | Freq: Every day | ORAL | Status: DC
  Administered 2013-11-26 – 2013-11-27 (×2): 20 mg via ORAL
  Filled 2013-11-26 (×2): qty 1

## 2013-11-26 MED ORDER — ONDANSETRON HCL 4 MG/2ML IJ SOLN: 4.0000 mg | Freq: Once | INTRAMUSCULAR | Status: DC

## 2013-11-26 MED ORDER — HYDRALAZINE HCL 20 MG/ML IJ SOLN
5.0000 mg | Freq: Three times a day (TID) | INTRAMUSCULAR | Status: DC | PRN
  Filled 2013-11-26: qty 1

## 2013-11-26 MED ORDER — SODIUM CHLORIDE 0.9 % IV SOLN: 250.0000 mL | INTRAVENOUS | Status: DC | PRN

## 2013-11-26 MED ORDER — ONDANSETRON HCL 4 MG PO TABS: 4.0000 mg | ORAL_TABLET | Freq: Four times a day (QID) | ORAL | Status: DC | PRN

## 2013-11-26 MED ORDER — NITROGLYCERIN 0.4 MG SL SUBL: 0.4000 mg | SUBLINGUAL_TABLET | SUBLINGUAL | Status: DC | PRN

## 2013-11-26 MED ORDER — ASPIRIN 81 MG PO CHEW
162.0000 mg | CHEWABLE_TABLET | Freq: Once | ORAL | Status: AC
  Administered 2013-11-26: 162 mg via ORAL
  Filled 2013-11-26: qty 2

## 2013-11-26 MED ORDER — ALBUTEROL SULFATE HFA 108 (90 BASE) MCG/ACT IN AERS
2.0000 | INHALATION_SPRAY | Freq: Four times a day (QID) | RESPIRATORY_TRACT | Status: DC | PRN
  Filled 2013-11-26: qty 6.7

## 2013-11-26 MED ORDER — LANTHANUM CARBONATE 500 MG PO CHEW
1000.0000 mg | CHEWABLE_TABLET | Freq: Three times a day (TID) | ORAL | Status: DC
  Administered 2013-11-26 – 2013-11-28 (×6): 1000 mg via ORAL
  Filled 2013-11-26 (×9): qty 2

## 2013-11-26 MED ORDER — SODIUM CHLORIDE 0.9 % IJ SOLN: 3.0000 mL | INTRAMUSCULAR | Status: DC | PRN

## 2013-11-26 MED ORDER — ONDANSETRON HCL 4 MG/2ML IJ SOLN
4.0000 mg | Freq: Four times a day (QID) | INTRAMUSCULAR | Status: DC | PRN
  Filled 2013-11-26: qty 2

## 2013-11-26 MED ORDER — CLONIDINE HCL 0.1 MG PO TABS
0.1000 mg | ORAL_TABLET | Freq: Every day | ORAL | Status: DC
  Administered 2013-11-26: 0.1 mg via ORAL
  Filled 2013-11-26: qty 1

## 2013-11-26 MED ORDER — ZOLPIDEM TARTRATE 5 MG PO TABS: 10.0000 mg | ORAL_TABLET | Freq: Every evening | ORAL | Status: DC | PRN

## 2013-11-26 MED ORDER — SODIUM CHLORIDE 0.9 % IV SOLN
INTRAVENOUS | Status: DC
  Administered 2013-11-27: via INTRAVENOUS

## 2013-11-26 MED ORDER — ALBUTEROL SULFATE (2.5 MG/3ML) 0.083% IN NEBU: 2.5000 mg | INHALATION_SOLUTION | Freq: Four times a day (QID) | RESPIRATORY_TRACT | Status: DC | PRN

## 2013-11-26 MED ORDER — ALPRAZOLAM 0.5 MG PO TABS
0.5000 mg | ORAL_TABLET | Freq: Three times a day (TID) | ORAL | Status: DC | PRN
  Administered 2013-11-26 – 2013-11-27 (×2): 0.5 mg via ORAL
  Filled 2013-11-26 (×3): qty 1

## 2013-11-26 MED ORDER — FOLIC ACID 1 MG PO TABS
1.0000 mg | ORAL_TABLET | Freq: Every day | ORAL | Status: DC
  Administered 2013-11-26 – 2013-11-28 (×3): 1 mg via ORAL
  Filled 2013-11-26 (×3): qty 1

## 2013-11-26 NOTE — ED Notes (Signed)
Pt woke up with left sided CP since 0830 this morning, took 2 ASA and 1 SL NTG with some relief, but came back; + diaphoresis, denies SOB or N/V, hx of MI and CABG

## 2013-11-26 NOTE — ED Provider Notes (Signed)
CSN: [id number]     Arrival date & time 11/26/13  1104 History  This chart was scribed for Mervin Kung, MD by Ladene Artist, ED Scribe. The patient was seen in room APA05/APA05. Patient's care was started at 12:49 PM.   Chief Complaint  Patient presents with  . Chest Pain    Patient is a 73 y.o. female presenting with chest pain. The history is provided by the patient. No language interpreter was used.  Chest Pain Pain location:  L chest Pain quality: sharp   Pain severity:  Severe Duration:  4 hours Associated symptoms: cough and diaphoresis   Associated symptoms: no abdominal pain, no fever, no headache, no nausea, no shortness of breath and not vomiting    HPI Comments: CHARLEENE TILLETT is a 73 y.o. female who presents to the Emergency Department complaining of constant, gradually improving, sharp L sided chest pain onset 8:30 AM. Pt took 2 ASA and 1 SL NTG with temporary relief. Pain 10/10 at worse, currently 5/10. She denies nausea, vomiting, SOB. Pt had bypass in 2013 and states that she has had chest pain since, but not this severe. Pt also had stents placed before bypass. Pt has been a Dialysis pt for 4 yrs, she goes Monday, Wednesday, Friday.  PCP: [facility] in [place] with [facility]  Past Medical History  Diagnosis Date  . ESRD on hemodialysis     ESRD due to HTN, started dialysis 2011 and gets HD at [facility] with Dr Hinda Lenis on MWF schedule.  Access is LUA AVF as of Sept 2014.   Marland Kitchen Chronic bronchitis   . GERD (gastroesophageal reflux disease)   . PUD (peptic ulcer disease)   . History of lower GI bleeding   . Arthritis   . History of gout   . CAD (coronary artery disease)     a. 12/2011 NSTEMI/Cath/PCI LCX (2.25x14 Resolute DES) & D1 (2.25x22 Resolute DES);  b. 01/2012 Cath/PCI: LM 30, LAD 30p, 40-2m, D1 stent ok, 99 in sm branch of diag, LCX patent stent, OM1 20, RCA 95 ost (4.0x12 Promus DES), EF 55%;  c. 04/2012 Lexi Cardiolite  EF 48%, small area of scar @  base/mid inflat wall with mild peri-infarct ischemia.; CABG 12/4  . High cholesterol 12/2011  . Pneumonia ~ 2009  . Iron deficiency anemia   . TIA (transient ischemic attack)   . Anxiety   . History of blood transfusion 07/2011; 12/2011; 01/2012 X 2; 04/2012  . Carotid artery disease     a. [id number] LICA, [id number]   . Mitral regurgitation     a. Moderate by echo, 02/2012  . Myocardial infarction   . Ovarian cancer 1992  . Colon cancer 1992  . Chronic diastolic CHF (congestive heart failure)     a. 02/2012 Echo EF 60-65%, nl wall motion, Gr 1 DD, mod MR  . Hypertension   . AVM (arteriovenous malformation) of colon   . Esophageal stricture    Past Surgical History  Procedure Laterality Date  . Abdominal hysterectomy  1992  . Appendectomy  06/1990  . Tubal ligation  1980's  . Av fistula placement  07/2009    left upper arm  . Thrombectomy / arteriovenous graft revision  2011    left upper arm  . Colon resection  1992  . Esophagogastroduodenoscopy  01/20/2012    Procedure: ESOPHAGOGASTRODUODENOSCOPY (EGD);  Surgeon: Ladene Artist, MD,FACG;  Location: [facility] ENDOSCOPY;  Service: Endoscopy;  Laterality: N/A;  . Dilation and curettage  of uterus    . Coronary angioplasty with stent placement  12/15/11    "2"  . Coronary angioplasty with stent placement  y/2013    "1; makes total of 3" (05/02/2012)  . Coronary artery bypass graft  06/13/2012    Procedure: CORONARY ARTERY BYPASS GRAFTING (CABG);  Surgeon: Grace Isaac, MD;  Location: [facility];  Service: Open Heart Surgery;  Laterality: N/A;  cabg x four;  using left internal mammary artery, and left leg greater saphenous vein harvested endoscopically  . Intraoperative transesophageal echocardiogram  06/13/2012    Procedure: INTRAOPERATIVE TRANSESOPHAGEAL ECHOCARDIOGRAM;  Surgeon: Grace Isaac, MD;  Location: [facility];  Service: Open Heart Surgery;  Laterality: N/A;  . Colectomy  1992  . Esophagogastroduodenoscopy N/A 03/26/2013    Procedure:  ESOPHAGOGASTRODUODENOSCOPY (EGD);  Surgeon: Irene Shipper, MD;  Location: [facility] ENDOSCOPY;  Service: Endoscopy;  Laterality: N/A;  . Stomach surgery     Family History  Problem Relation Age of Onset  . Other      noncontributory for early CAD  . Heart disease Mother     Heart Disease before age 22  . Hyperlipidemia Mother   . Hypertension Mother   . Diabetes Mother   . Heart attack Mother   . Heart disease Father     Heart Disease before age 22  . Hyperlipidemia Father   . Hypertension Father   . Diabetes Father   . Diabetes Sister   . Diabetes Brother   . Hyperlipidemia Brother    History  Substance Use Topics  . Smoking status: Never Smoker   . Smokeless tobacco: Never Used  . Alcohol Use: No   OB History   Grav Para Term Preterm Abortions TAB SAB Ect Mult Living                 Review of Systems  Constitutional: Positive for diaphoresis. Negative for fever.  HENT: Negative for rhinorrhea and sore throat.   Eyes: Negative for visual disturbance.  Respiratory: Positive for cough. Negative for shortness of breath.   Cardiovascular: Positive for chest pain. Negative for leg swelling.  Gastrointestinal: Negative for nausea, vomiting, abdominal pain and diarrhea.  Genitourinary: Negative for dysuria.  Skin: Positive for rash.  Neurological: Negative for headaches.  Hematological: Bruises/bleeds easily.  Psychiatric/Behavioral: Negative for confusion.  All other systems reviewed and are negative.   Allergies  Aspirin; Contrast media; Iron; Macrodantin; Penicillins; Plavix; Bactrim; Sulfa antibiotics; Venofer; Dexilant; Prilosec; Levaquin; and Protonix  Home Medications   Prior to Admission medications   Medication Sig Start Date End Date Taking? Authorizing Provider  albuterol (PROVENTIL HFA;VENTOLIN HFA) 108 (90 BASE) MCG/ACT inhaler Inhale 2 puffs into the lungs every 6 (six) hours as needed for wheezing or shortness of breath.    Yes Historical Provider, MD   ALPRAZolam Duanne Moron) 0.5 MG tablet Take 0.5 mg by mouth 3 (three) times daily.   Yes Historical Provider, MD  aspirin 81 MG chewable tablet Chew 1 tablet (81 mg total) by mouth daily. 03/27/13  Yes Verlee Monte, MD  cloNIDine (CATAPRES) 0.2 MG tablet Take 1 tablet (0.2 mg total) by mouth as needed. 06/24/13  Yes Herminio Commons, MD  fluticasone (FLONASE) 50 MCG/ACT nasal spray Place 2 sprays into the nose daily.   Yes Historical Provider, MD  folic acid (FOLVITE) 1 MG tablet Take 1 mg by mouth daily.   Yes Historical Provider, MD  folic acid-vitamin b complex-vitamin c-selenium-zinc (DIALYVITE) 3 MG TABS Take 1 tablet by  mouth daily.   Yes Historical Provider, MD  labetalol (NORMODYNE) 100 MG tablet Take 1.5 tablets (150 mg total) by mouth 2 (two) times daily. 11/13/13  Yes Herminio Commons, MD  lanthanum (FOSRENOL) 1000 MG chewable tablet Chew 1,000 mg by mouth 3 (three) times daily after meals.   Yes Historical Provider, MD  lidocaine-prilocaine (EMLA) cream Apply 1 application topically every Monday, Wednesday, and Friday.    Yes Historical Provider, MD  loratadine (CLARITIN) 10 MG tablet Take 10 mg by mouth daily.    Yes Historical Provider, MD  losartan (COZAAR) 100 MG tablet Take 100 mg by mouth daily.   Yes Historical Provider, MD  nitroGLYCERIN (NITROSTAT) 0.4 MG SL tablet Place 0.4 mg under the tongue every 5 (five) minutes as needed for chest pain. 10/04/12  Yes Burna Forts Serpe, PA-C  polyethylene glycol (MIRALAX / GLYCOLAX) packet Take 17 g by mouth daily as needed for mild constipation.   Yes Historical Provider, MD  SENSIPAR 30 MG tablet Take 30 mg by mouth daily.  12/21/12  Yes Historical Provider, MD  simvastatin (ZOCOR) 20 MG tablet Take 1 tablet (20 mg total) by mouth at bedtime. 09/20/13  Yes Herminio Commons, MD  traMADol (ULTRAM) 50 MG tablet Take 50 mg by mouth every 6 (six) hours as needed for pain.  07/12/12  Yes Jessica A Hope, PA-C  zolpidem (AMBIEN) 10 MG tablet Take 10 mg  by mouth at bedtime as needed for sleep.  05/27/13  Yes Historical Provider, MD   Triage Vitals: BP 210/90  Pulse 84  Temp(Src) 98.2 F (36.8 C) (Oral)  Resp 20  SpO2 94% Physical Exam  Nursing note and vitals reviewed. Constitutional: She is oriented to person, place, and time. She appears well-developed and well-nourished. No distress.  HENT:  Head: Normocephalic and atraumatic.  Eyes: EOM are normal.  Neck: Neck supple.  Cardiovascular: Normal rate and regular rhythm.   Pulses:      Radial pulses are 2+ on the left side.  AV has good thrill in upper L arm  Pulmonary/Chest: Effort normal and breath sounds normal. No respiratory distress.  Abdominal: Bowel sounds are normal. There is no tenderness.  Musculoskeletal: Normal range of motion.  Neurological: She is alert and oriented to person, place, and time. No cranial nerve deficit. She exhibits normal muscle tone. Coordination normal.  Skin: Skin is warm and dry.  Well healed medical synonymy incision   Psychiatric: She has a normal mood and affect. Her behavior is normal.    ED Course  Procedures (including critical care time) DIAGNOSTIC STUDIES: Oxygen Saturation is 95% on RA, adequate by my interpretation.    COORDINATION OF CARE: 12:56 PM-Discussed treatment plan which includes diagnostic lab work with pt at bedside and pt agreed to plan.   Labs Review Labs Reviewed  CBC WITH DIFFERENTIAL - Abnormal; Notable for the following:    RBC 3.43 (*)    Hemoglobin 11.2 (*)    HCT 35.4 (*)    MCV 103.2 (*)    Eosinophils Relative 7 (*)    All other components within normal limits  BASIC METABOLIC PANEL - Abnormal; Notable for the following:    BUN 43 (*)    Creatinine, Ser 8.74 (*)    Calcium 8.3 (*)    GFR calc non Af Amer 4 (*)    GFR calc Af Amer 5 (*)    All other components within normal limits  TROPONIN I   Results for orders placed  during the hospital encounter of 11/26/13  CBC WITH DIFFERENTIAL      Result  Value Ref Range   WBC 5.6  4.0 - 10.5 K/uL   RBC 3.43 (*) 3.87 - 5.11 MIL/uL   Hemoglobin 11.2 (*) 12.0 - 15.0 g/dL   HCT 35.4 (*) 36.0 - 46.0 %   MCV 103.2 (*) 78.0 - 100.0 fL   MCH 32.7  26.0 - 34.0 pg   MCHC 31.6  30.0 - 36.0 g/dL   RDW 15.2  11.5 - 15.5 %   Platelets 206  150 - 400 K/uL   Neutrophils Relative % 67  43 - 77 %   Neutro Abs 3.8  1.7 - 7.7 K/uL   Lymphocytes Relative 17  12 - 46 %   Lymphs Abs 1.0  0.7 - 4.0 K/uL   Monocytes Relative 8  3 - 12 %   Monocytes Absolute 0.4  0.1 - 1.0 K/uL   Eosinophils Relative 7 (*) 0 - 5 %   Eosinophils Absolute 0.4  0.0 - 0.7 K/uL   Basophils Relative 1  0 - 1 %   Basophils Absolute 0.1  0.0 - 0.1 K/uL  BASIC METABOLIC PANEL      Result Value Ref Range   Sodium 144  137 - 147 mEq/L   Potassium 4.9  3.7 - 5.3 mEq/L   Chloride 97  96 - 112 mEq/L   CO2 32  19 - 32 mEq/L   Glucose, Bld 89  70 - 99 mg/dL   BUN 43 (*) 6 - 23 mg/dL   Creatinine, Ser 8.74 (*) 0.50 - 1.10 mg/dL   Calcium 8.3 (*) 8.4 - 10.5 mg/dL   GFR calc non Af Amer 4 (*) >90 mL/min   GFR calc Af Amer 5 (*) >90 mL/min  TROPONIN I      Result Value Ref Range   Troponin I <0.30  <0.30 ng/mL     Imaging Review Dg Chest Portable 1 View  11/26/2013   CLINICAL DATA:  Left-sided chest pain  EXAM: PORTABLE CHEST - 1 VIEW  COMPARISON:  10/03/2013  FINDINGS: There are mild chronic bronchitic changes. There is mild interstitial prominence which is accentuated by low lung volumes. There is no focal parenchymal opacity, pleural effusion, or pneumothorax. The heart size is top-normal. There is evidence of prior CABG. The osseous structures are unremarkable.  IMPRESSION: No active disease.   Electronically Signed   By: Kathreen Devoid   On: 11/26/2013 11:44     EKG Interpretation   Date/Time:  Tuesday Nov 26 2013 11:09:19 EDT Ventricular Rate:  79 PR Interval:  142 QRS Duration: 104 QT Interval:  426 QTC Calculation: 488 R Axis:   8 Text Interpretation:  Normal sinus  rhythm Left ventricular hypertrophy  with repolarization abnormality Cannot rule out Septal infarct , age  undetermined Abnormal ECG No significant change since EKG November 2014  Confirmed by Rogene Houston  MD, [place] [phone]) on 11/26/2013 11:14:34 AM Also  confirmed by Rogene Houston  MD, Seth Friedlander [phone])  on 11/26/2013 2:13:43 PM      MDM   Final diagnoses:  Chest pain  Renal failure    Patient's chest pain now resolved without additional nitroglycerin. Patient given 2 more baby aspirin. Patient will be admitted to telemetry for chest pain rule out. Her cardiology is here at [facility]. Chest x-ray without acute findings. EKG really shows no significant changes compared to November of 2014. There are some changes compared to an EKG  in April. But today's EKG looks just like November 2014 therefore no acute changes. Chest x-ray shows no pulmonary edema pneumonia or pneumothorax. No leukocytosis no significant anemia electrolytes patient was just dialyzed yesterday potassium is 4.9. Patient normally dialyzed Monday Wednesdays and Fridays.  I personally performed the services described in this documentation, which was scribed in my presence. The recorded information has been reviewed and is accurate.    Mervin Kung, MD 11/26/13 [phone]

## 2013-11-26 NOTE — H&P (Signed)
[facility] History and Physical  Shawna [name] [id number] DOB: 10/14/1940 DOA: 11/26/2013  Referring physician: ED physician.  PCP: [name], MD   Chief Complaint: Chest pain.   HPI: Shawna [name] is a 73 y.o. female with PMH significant for CAD S/P CABG 2013, History AVM malformation colon, Diastolic Heart Failure, ESRD on hemodialysis M, W, F who presents to ED complaining of chest pain. She relates chest pain, left side of her chest, sharp in quality, lasted for 2 to 3 hours. The chest pain decrease in intensity after nitroglycerin. It was not associated with Dyspnea. She is chest pain free at this time. She report soreness on palpation of her chest  earlier. She also report Chest pain on exertion for last 2 month.   She also mention diarrhea for last 2 to 3 days, watery, 2 to 3 BM per day. She finished recently antibiotics for sinusitis. She relates some abdominal discomfort.    Review of Systems:  Negative, except as per HPI.   Past Medical History  Diagnosis Date  . ESRD on hemodialysis     ESRD due to HTN, started dialysis 2011 and gets HD at [facility] with Dr Hinda Lenis on MWF schedule.  Access is LUA AVF as of Sept 2014.   Marland Kitchen Chronic bronchitis   . GERD (gastroesophageal reflux disease)   . PUD (peptic ulcer disease)   . History of lower GI bleeding   . Arthritis   . History of gout   . CAD (coronary artery disease)     a. 12/2011 NSTEMI/Cath/PCI LCX (2.25x14 Resolute DES) & D1 (2.25x22 Resolute DES);  b. 01/2012 Cath/PCI: LM 30, LAD 30p, 40-4m, D1 stent ok, 99 in sm branch of diag, LCX patent stent, OM1 20, RCA 95 ost (4.0x12 Promus DES), EF 55%;  c. 04/2012 Lexi Cardiolite  EF 48%, small area of scar @ base/mid inflat wall with mild peri-infarct ischemia.; CABG 12/4  . High cholesterol 12/2011  . Pneumonia ~ 2009  . Iron deficiency anemia   . TIA (transient ischemic attack)   . Anxiety   . History of blood transfusion 07/2011; 12/2011; 01/2012 X 2; 04/2012  .  Carotid artery disease     a. [id number] LICA, [id number]   . Mitral regurgitation     a. Moderate by echo, 02/2012  . Myocardial infarction   . Ovarian cancer 1992  . Colon cancer 1992  . Chronic diastolic CHF (congestive heart failure)     a. 02/2012 Echo EF 60-65%, nl wall motion, Gr 1 DD, mod MR  . Hypertension   . AVM (arteriovenous malformation) of colon   . Esophageal stricture    Past Surgical History  Procedure Laterality Date  . Abdominal hysterectomy  1992  . Appendectomy  06/1990  . Tubal ligation  1980's  . Av fistula placement  07/2009    left upper arm  . Thrombectomy / arteriovenous graft revision  2011    left upper arm  . Colon resection  1992  . Esophagogastroduodenoscopy  01/20/2012    Procedure: ESOPHAGOGASTRODUODENOSCOPY (EGD);  Surgeon: Ladene Artist, MD,FACG;  Location: [facility] ENDOSCOPY;  Service: Endoscopy;  Laterality: N/A;  . Dilation and curettage of uterus    . Coronary angioplasty with stent placement  12/15/11    "2"  . Coronary angioplasty with stent placement  y/2013    "1; makes total of 3" (05/02/2012)  . Coronary artery bypass graft  06/13/2012    Procedure: CORONARY ARTERY BYPASS GRAFTING (CABG);  Surgeon: Grace Isaac, MD;  Location: [place];  Service: Open Heart Surgery;  Laterality: N/A;  cabg x four;  using left internal mammary artery, and left leg greater saphenous vein harvested endoscopically  . Intraoperative transesophageal echocardiogram  06/13/2012    Procedure: INTRAOPERATIVE TRANSESOPHAGEAL ECHOCARDIOGRAM;  Surgeon: Grace Isaac, MD;  Location: [place];  Service: Open Heart Surgery;  Laterality: N/A;  . Colectomy  1992  . Esophagogastroduodenoscopy N/A 03/26/2013    Procedure: ESOPHAGOGASTRODUODENOSCOPY (EGD);  Surgeon: Irene Shipper, MD;  Location: [facility] ENDOSCOPY;  Service: Endoscopy;  Laterality: N/A;  . Stomach surgery     Social History:  reports that she has never smoked. She has never used smokeless tobacco. She reports that she does not  drink alcohol or use illicit drugs.  Allergies  Allergen Reactions  . Aspirin Other (See Comments)    Mess up her stomach; "makes my bowels have blood in them". Takes 81 mg EC Aspirin   . Contrast Media [Iodinated Diagnostic Agents] Itching  . Iron Itching and Other (See Comments)    "they gave me iron in dialysis; had to give me Benadryl cause I had to have the iron" (05/02/2012)  . Macrodantin [Nitrofurantoin Macrocrystal] Other (See Comments)    "broke me out in big old knots all over my body; had to go to ER"  . Penicillins Other (See Comments)    "makes me real weak when I take it; like I'll pass out"  . Plavix [Clopidogrel Bisulfate] Rash  . Bactrim [Sulfamethoxazole-Trimethoprim] Rash  . Sulfa Antibiotics Rash  . Venofer [Ferric Oxide] Itching    Patient reports using Benadryl prior to doses as [facility]  . Dexilant [Dexlansoprazole] Other (See Comments)    Upset stomach  . Prilosec [Omeprazole] Other (See Comments)    "back spasms"  . Levaquin [Levofloxacin In D5w] Rash  . Protonix [Pantoprazole Sodium] Rash    Family History  Problem Relation Age of Onset  . Other      noncontributory for early CAD  . Heart disease Mother     Heart Disease before age 75  . Hyperlipidemia Mother   . Hypertension Mother   . Diabetes Mother   . Heart attack Mother   . Heart disease Father     Heart Disease before age 22  . Hyperlipidemia Father   . Hypertension Father   . Diabetes Father   . Diabetes Sister   . Diabetes Brother   . Hyperlipidemia Brother      Prior to Admission medications   Medication Sig Start Date End Date Taking? Authorizing Provider  albuterol (PROVENTIL HFA;VENTOLIN HFA) 108 (90 BASE) MCG/ACT inhaler Inhale 2 puffs into the lungs every 6 (six) hours as needed for wheezing or shortness of breath.    Yes Historical Provider, MD  ALPRAZolam Duanne Moron) 0.5 MG tablet Take 0.5 mg by mouth 3 (three) times daily.   Yes Historical Provider, MD  aspirin 81 MG  chewable tablet Chew 1 tablet (81 mg total) by mouth daily. 03/27/13  Yes Verlee Monte, MD  cloNIDine (CATAPRES) 0.2 MG tablet Take 1 tablet (0.2 mg total) by mouth as needed. 06/24/13  Yes Herminio Commons, MD  fluticasone (FLONASE) 50 MCG/ACT nasal spray Place 2 sprays into the nose daily.   Yes Historical Provider, MD  folic acid (FOLVITE) 1 MG tablet Take 1 mg by mouth daily.   Yes Historical Provider, MD  folic acid-vitamin b complex-vitamin c-selenium-zinc (DIALYVITE) 3 MG TABS Take 1 tablet by  mouth daily.   Yes Historical Provider, MD  labetalol (NORMODYNE) 100 MG tablet Take 1.5 tablets (150 mg total) by mouth 2 (two) times daily. 11/13/13  Yes Herminio Commons, MD  lanthanum (FOSRENOL) 1000 MG chewable tablet Chew 1,000 mg by mouth 3 (three) times daily after meals.   Yes Historical Provider, MD  lidocaine-prilocaine (EMLA) cream Apply 1 application topically every Monday, Wednesday, and Friday.    Yes Historical Provider, MD  loratadine (CLARITIN) 10 MG tablet Take 10 mg by mouth daily.    Yes Historical Provider, MD  losartan (COZAAR) 100 MG tablet Take 100 mg by mouth daily.   Yes Historical Provider, MD  nitroGLYCERIN (NITROSTAT) 0.4 MG SL tablet Place 0.4 mg under the tongue every 5 (five) minutes as needed for chest pain. 10/04/12  Yes Burna Forts Serpe, PA-C  polyethylene glycol (MIRALAX / GLYCOLAX) packet Take 17 g by mouth daily as needed for mild constipation.   Yes Historical Provider, MD  SENSIPAR 30 MG tablet Take 30 mg by mouth daily.  12/21/12  Yes Historical Provider, MD  simvastatin (ZOCOR) 20 MG tablet Take 1 tablet (20 mg total) by mouth at bedtime. 09/20/13  Yes Herminio Commons, MD  traMADol (ULTRAM) 50 MG tablet Take 50 mg by mouth every 6 (six) hours as needed for pain.  07/12/12  Yes Jessica A Hope, PA-C  zolpidem (AMBIEN) 10 MG tablet Take 10 mg by mouth at bedtime as needed for sleep.  05/27/13  Yes Historical Provider, MD   Physical Exam: Filed Vitals:   11/26/13  1530  BP: 218/87  Pulse: 73  Temp:   Resp: 17    BP 218/87  Pulse 73  Temp(Src) 98.2 F (36.8 C) (Oral)  Resp 17  SpO2 96%  General:  Appears calm and comfortable Eyes: PERRL, normal lids, irises & conjunctiva ENT: grossly normal hearing, lips & tongue Neck: no LAD, masses or thyromegaly Cardiovascular: RRR, no m/r/g. No LE edema. Telemetry: SR, no arrhythmias  Respiratory: CTA bilaterally, no w/r/r. Normal respiratory effort. Abdomen: soft, ntnd Skin: no rash or induration seen on limited exam Musculoskeletal: grossly normal tone BUE/BLE Psychiatric: grossly normal mood and affect, speech fluent and appropriate Neurologic: grossly non-focal.          Labs on Admission:  Basic Metabolic Panel:  Recent Labs Lab 11/26/13 1158  NA 144  K 4.9  CL 97  CO2 32  GLUCOSE 89  BUN 43*  CREATININE 8.74*  CALCIUM 8.3*   Liver Function Tests: No results found for this basename: AST, ALT, ALKPHOS, BILITOT, PROT, ALBUMIN,  in the last 168 hours No results found for this basename: LIPASE, AMYLASE,  in the last 168 hours No results found for this basename: AMMONIA,  in the last 168 hours CBC:  Recent Labs Lab 11/26/13 1158  WBC 5.6  NEUTROABS 3.8  HGB 11.2*  HCT 35.4*  MCV 103.2*  PLT 206   Cardiac Enzymes:  Recent Labs Lab 11/26/13 1158  TROPONINI <0.30    BNP (last 3 results)  Recent Labs  12/16/12 1810  PROBNP 8774.0*   CBG: No results found for this basename: GLUCAP,  in the last 168 hours  Radiological Exams on Admission: Dg Chest Portable 1 View  11/26/2013   CLINICAL DATA:  Left-sided chest pain  EXAM: PORTABLE CHEST - 1 VIEW  COMPARISON:  10/03/2013  FINDINGS: There are mild chronic bronchitic changes. There is mild interstitial prominence which is accentuated by low lung volumes. There is no  focal parenchymal opacity, pleural effusion, or pneumothorax. The heart size is top-normal. There is evidence of prior CABG. The osseous structures are  unremarkable.  IMPRESSION: No active disease.   Electronically Signed   By: Kathreen Devoid   On: 11/26/2013 11:44    EKG: Independently reviewed. Old ST elevation anterior lead.   Assessment/Plan Active Problems:   Coronary atherosclerosis of native coronary artery   ESRD on hemodialysis   Chest pain   1-Chest pain; With atypical and typical feature. She report chest pain on exertion. In patient with known CAD, multiple risk factors. Admit to telemetry, cycle cardiac enzymes. Check ECHO. Cardio consultation. Nitroglycerin, aspirin, continue with labetalol and statin.   2-Hypertension; Uncontrolled. She relates she is having trouble controlling BP at home. She take half tablet of clonidine if her BP is elevated. She takes labetalol BID. She has taken labetalol TID at time because her BP has been elevated. She has done that by her own. She was not able to take her medications today. I will resume her home medications. I will add PRN hydralazine. Depending on BP reading, will need to adjust her medications. She relates she is very sensitive to clonidine dose, her BP drop significantly with clonidine. I would avoid increasing clonidine.   3-Diarrhea; patient on hemodialysis, recent antibiotic use. Will check for C diff. Start flagyl if c diff positive.   4-ESRD; On hemodialysis M, W, F; She will probably need dialysis tomorrow. Will consult nephrologist.   5-Anxiety; continue with Xanax PRN.   Code Status: Full Code  Family Communication: care discussed with patient.  Disposition Plan: admit under observation expect less than 2 night admission.   Time spent: 65 minutes.   [facility] Pager [phone]

## 2013-11-26 NOTE — Telephone Encounter (Signed)
Patient informed nurse that she called PCP office and was informed to go to the ED for an evaluation. Nurse advised patient that PCP's office would be contacted for an appointment. Per receptionist at Dr. Lyman Speller office, patient was advised to go to the ED per Dr. Nadara Mustard. Patient informed that per Dr. Nadara Mustard, she is to go to the ED. Patient verbalized understanding of plan.

## 2013-11-26 NOTE — Telephone Encounter (Signed)
Patient c/o sharp continuous pain in left breast going into her back. No dizziness, sob. Patient said she took aspirin but it didn't help. Nurse advised patient to call her PCP since the pain was in her breast. Nurse also advised patient to try using her nitroglycerin to see if she gets any relief. Nurse advised patient that if PCP thinks its cardiac related, then they should contact our office.

## 2013-11-27 DIAGNOSIS — I5022 Chronic systolic (congestive) heart failure: Secondary | ICD-10-CM

## 2013-11-27 DIAGNOSIS — I059 Rheumatic mitral valve disease, unspecified: Secondary | ICD-10-CM

## 2013-11-27 DIAGNOSIS — E785 Hyperlipidemia, unspecified: Secondary | ICD-10-CM

## 2013-11-27 DIAGNOSIS — I25810 Atherosclerosis of coronary artery bypass graft(s) without angina pectoris: Secondary | ICD-10-CM

## 2013-11-27 DIAGNOSIS — I779 Disorder of arteries and arterioles, unspecified: Secondary | ICD-10-CM

## 2013-11-27 DIAGNOSIS — N039 Chronic nephritic syndrome with unspecified morphologic changes: Secondary | ICD-10-CM

## 2013-11-27 DIAGNOSIS — I10 Essential (primary) hypertension: Secondary | ICD-10-CM

## 2013-11-27 DIAGNOSIS — I509 Heart failure, unspecified: Secondary | ICD-10-CM

## 2013-11-27 DIAGNOSIS — D631 Anemia in chronic kidney disease: Secondary | ICD-10-CM

## 2013-11-27 DIAGNOSIS — R197 Diarrhea, unspecified: Secondary | ICD-10-CM

## 2013-11-27 DIAGNOSIS — I517 Cardiomegaly: Secondary | ICD-10-CM

## 2013-11-27 DIAGNOSIS — Z951 Presence of aortocoronary bypass graft: Secondary | ICD-10-CM

## 2013-11-27 DIAGNOSIS — N189 Chronic kidney disease, unspecified: Secondary | ICD-10-CM

## 2013-11-27 LAB — TROPONIN I: Troponin I: <0.3 ng/mL (ref <0.30)

## 2013-11-27 LAB — HEPATITIS B SURFACE ANTIGEN: Hepatitis B Surface Ag: NEGATIVE

## 2013-11-27 MED ORDER — PENTAFLUOROPROP-TETRAFLUOROETH EX AERO
INHALATION_SPRAY | CUTANEOUS | Status: DC | PRN
  Filled 2013-11-27: qty 103.5

## 2013-11-27 MED ORDER — PENTAFLUOROPROP-TETRAFLUOROETH EX AERO
1.0000 "application " | INHALATION_SPRAY | CUTANEOUS | Status: DC | PRN
  Filled 2013-11-27 (×2): qty 103.5

## 2013-11-27 MED ORDER — HYDRALAZINE HCL 20 MG/ML IJ SOLN
20.0000 mg | Freq: Once | INTRAMUSCULAR | Status: AC
  Administered 2013-11-27: 20 mg via INTRAVENOUS

## 2013-11-27 MED ORDER — LIDOCAINE-PRILOCAINE 2.5-2.5 % EX CREA: 1.0000 "application " | TOPICAL_CREAM | CUTANEOUS | Status: DC | PRN

## 2013-11-27 MED ORDER — LIDOCAINE HCL (PF) 1 % IJ SOLN: 5.0000 mL | INTRAMUSCULAR | Status: DC | PRN

## 2013-11-27 MED ORDER — HEPARIN SODIUM (PORCINE) 1000 UNIT/ML DIALYSIS
20.0000 [IU]/kg | INTRAMUSCULAR | Status: DC | PRN
  Filled 2013-11-27: qty 2

## 2013-11-27 MED ORDER — HEPARIN SODIUM (PORCINE) 1000 UNIT/ML DIALYSIS
1000.0000 [IU] | INTRAMUSCULAR | Status: DC | PRN
  Filled 2013-11-27: qty 1

## 2013-11-27 MED ORDER — ALTEPLASE 2 MG IJ SOLR
2.0000 mg | Freq: Once | INTRAMUSCULAR | Status: AC | PRN
  Filled 2013-11-27: qty 2

## 2013-11-27 MED ORDER — ISOSORBIDE DINITRATE 20 MG PO TABS
10.0000 mg | ORAL_TABLET | Freq: Three times a day (TID) | ORAL | Status: DC
  Administered 2013-11-27 – 2013-11-28 (×4): 10 mg via ORAL
  Filled 2013-11-27 (×4): qty 1

## 2013-11-27 MED ORDER — CLONIDINE HCL 0.1 MG PO TABS
0.1000 mg | ORAL_TABLET | Freq: Two times a day (BID) | ORAL | Status: DC
  Administered 2013-11-27 – 2013-11-28 (×3): 0.1 mg via ORAL
  Filled 2013-11-27 (×3): qty 1

## 2013-11-27 MED ORDER — SODIUM CHLORIDE 0.9 % IV SOLN: 100.0000 mL | INTRAVENOUS | Status: DC | PRN

## 2013-11-27 MED ORDER — AMLODIPINE BESYLATE 5 MG PO TABS
5.0000 mg | ORAL_TABLET | Freq: Every day | ORAL | Status: DC
  Administered 2013-11-27 – 2013-11-28 (×2): 5 mg via ORAL
  Filled 2013-11-27 (×2): qty 1

## 2013-11-27 MED ORDER — ISOSORBIDE MONONITRATE ER 60 MG PO TB24
30.0000 mg | ORAL_TABLET | Freq: Every day | ORAL | Status: DC
  Filled 2013-11-27: qty 1

## 2013-11-27 MED ORDER — HYDRALAZINE HCL 20 MG/ML IJ SOLN: 20.0000 mg | Freq: Four times a day (QID) | INTRAMUSCULAR | Status: DC

## 2013-11-27 MED ORDER — NEPRO/CARBSTEADY PO LIQD: 237.0000 mL | ORAL | Status: DC | PRN

## 2013-11-27 NOTE — Procedures (Addendum)
   HEMODIALYSIS TREATMENT NOTE:  3.5 hour low heparin dialysis completed via left upper arm AVF (15g ante/retrograde).  BP 208/105 at onset of treatment.  Sodium variation was discontinued.  BP steadily increased.  Pt opted to take Clonidine 0.1 mg po at 1000.  By 1015 BP was 243/123 and Dr. Bronson Ing, at pt's bedside, ordered Hydralazine 20 mg IV stat:  1015 - Hydralazine 20 mg IVP 1020 - 201/99 and pt stated she felt "flushed" 1030 - Pt diaphoretic and retching. BP 110/53.  Ultrafiltration was interrupted and NS bolus of 400cc was given.  Dr. Theador Hawthorne was at pt's bedside and ordered Zofran and NS prn 1035 - 180/72, nausea subsided 1040 - 182/74 1045 - 175/68 1055 - 133/59 and pt stated "My pressure's dropping!"  NS bolus 200cc given 1100 - 123/53 "I need saline!"  NS bolus 100cc given 1105 - 142/64  BP was checked q 5 minutes and stabilized in 0000000 systolic for the next 40 minutes.  Ultrafiltration was resumed when SBP increased to 160.  Pt very anxious, emotional support offered.  Pt completed 3.5 hours of dialysis and BP continued to steadily increase.  Net UF removed: 505cc.  All blood was reinfused and hemostasis was achieved within 15 minutes.  Post tx BP 191/89.  Report called to Morene Antu, RN.  Idriss Quackenbush L. Jesse Nosbisch, RN, CDN    Addendum:  Above events discussed with Dr. Bronson Ing.  PLAN:  Labetalol and Norvasc po post HD.  Change Imdur from 30mg  qd to 10mg  tid.  Discontinued scheduled Hydralazine IV.  Changes reported to Morene Antu, Therapist, sports.

## 2013-11-27 NOTE — Progress Notes (Signed)
[facility] PROGRESS NOTE  Shawna [name] [id number] DOB: [birth year] DOA: 11/26/2013 PCP: [name], MD  Assessment/Plan: 1-Chest pain; With atypical and typical feature. She reported chest pain on exertion. In patient with known CAD, multiple risk factors. Cardiac enzymes negative to date. Await ECHO. Nitroglycerin, aspirin, continue with labetalol and statin. Evaluated by Cardiology who opine no ACS but may need Lexiscan Cardiolite vs coronary angiography depending on further symptoms once BP controlled.  2-Hypertension; Uncontrolled. She related she was having trouble controlling BP at home. She takes half tablet of clonidine if her BP is elevated. She takes labetalol BID. She has taken labetalol TID at time because her BP has been elevated. Home medications include labetalol, losartan and clonidine. She missed dose of clonidine yesterday. Scheduled hydralazine added per cards.Of note avoid increasing clonidine as patient states she is very sensitive to this and BP drops.  3-Diarrhea; patient on hemodialysis, recent antibiotic use. No further diarrhea.  Will check for C diff. Start flagyl if c diff positive.  4-ESRD; On hemodialysis M, W, F; appreciate  Nephrologist assistance. Dialysis today 5-Anxiety; continue with Xanax PRN  Code Status: full Family Communication: none present Disposition Plan: home when ready   Consultants:  cardiology  Procedures:  none  Antibiotics:  none  HPI/Subjective: Denies pain/discomfort  Objective: Filed Vitals:   11/27/13 1200  BP: 169/76  Pulse: 70  Temp:   Resp:     Intake/Output Summary (Last 24 hours) at 11/27/13 1202 Last data filed at 11/26/13 1600  Gross per 24 hour  Intake      0 ml  Output      0 ml  Net      0 ml   Filed Weights   11/26/13 1627 11/27/13 0620 11/27/13 0900  Weight: 66.271 kg (146 lb 1.6 oz) 66.044 kg (145 lb 9.6 oz) 66.1 kg (145 lb 11.6 oz)    Exam:   General:  Well nourished  NAD  Cardiovascular: RRR No MGR No LE edema  Respiratory: normal effort BS clear no wheeze  Abdomen: soft non-tender to palpation  Musculoskeletal: no clubbing or cyanosis   Data Reviewed: Basic Metabolic Panel:  Recent Labs Lab 11/26/13 1158  NA 144  K 4.9  CL 97  CO2 32  GLUCOSE 89  BUN 43*  CREATININE 8.74*  CALCIUM 8.3*   Liver Function Tests: No results found for this basename: AST, ALT, ALKPHOS, BILITOT, PROT, ALBUMIN,  in the last 168 hours No results found for this basename: LIPASE, AMYLASE,  in the last 168 hours No results found for this basename: AMMONIA,  in the last 168 hours CBC:  Recent Labs Lab 11/26/13 1158  WBC 5.6  NEUTROABS 3.8  HGB 11.2*  HCT 35.4*  MCV 103.2*  PLT 206   Cardiac Enzymes:  Recent Labs Lab 11/26/13 1158 11/26/13 1555 11/26/13 2150 11/27/13 0300  TROPONINI <0.30 <0.30 <0.30 <0.30   BNP (last 3 results)  Recent Labs  12/16/12 1810  PROBNP 8774.0*   CBG: No results found for this basename: GLUCAP,  in the last 168 hours  No results found for this or any previous visit (from the past 240 hour(s)).   Studies: Dg Chest Portable 1 View  11/26/2013   CLINICAL DATA:  Left-sided chest pain  EXAM: PORTABLE CHEST - 1 VIEW  COMPARISON:  10/03/2013  FINDINGS: There are mild chronic bronchitic changes. There is mild interstitial prominence which is accentuated by low lung volumes. There is no focal parenchymal opacity, pleural effusion,  or pneumothorax. The heart size is top-normal. There is evidence of prior CABG. The osseous structures are unremarkable.  IMPRESSION: No active disease.   Electronically Signed   By: Kathreen Devoid   On: 11/26/2013 11:44    Scheduled Meds: . amLODipine  5 mg Oral Daily  . aspirin  81 mg Oral Daily  . cinacalcet  30 mg Oral QAC breakfast  . cloNIDine  0.1 mg Oral BID  . folic acid  1 mg Oral Daily  . heparin  5,000 Units Subcutaneous 3 times per day  . hydrALAZINE  20 mg Intravenous Q6H  .  isosorbide mononitrate  30 mg Oral Daily  . labetalol  150 mg Oral BID  . lanthanum  1,000 mg Oral TID PC  . loratadine  10 mg Oral Daily  . losartan  100 mg Oral Daily  . simvastatin  20 mg Oral QHS  . sodium chloride  3 mL Intravenous Q12H   Continuous Infusions: . sodium chloride 10 mL/hr at 11/27/13 0002    Active Problems:   Coronary atherosclerosis of native coronary artery   ESRD on hemodialysis   Chest pain    Time spent: 30 minutes    [facility] Pager [phone]. If 7PM-7AM, please contact night-coverage at [email or web address], password [email or web address] 11/27/2013, 12:02 PM  LOS: 1 day

## 2013-11-27 NOTE — Progress Notes (Signed)
UR completed 

## 2013-11-27 NOTE — Progress Notes (Signed)
  Echocardiogram 2D Echocardiogram has been performed.  Shawna [name] Eads 11/27/2013, 4:26 PM

## 2013-11-27 NOTE — Consult Note (Signed)
Shawna [name] MRN: [id number] DOB/AGE: [birth year] 73 y.o. Primary Care Physician:Shawna [name], Shawna Bihari, MD Admit date: 11/26/2013 Chief Complaint:  Chief Complaint  Patient presents with  . Chest Pain   HPI: Pt is 73 year old female withg past medical hx of ESRD who was presented to Er with c/o chest pain.  HPI dates back to yesterday when pt started having chest pain,left sided in location, lasted for few hrs. On Presentation to Er pt was admitted for further work up. Pt today feels much better NO c/o dypnea No c/o fever/cough/chiils NO c/o nausea/vomiting NO c/o hematuria No c/o syncope No c/o recent travel.  Pt seen on HD.Pt tolerating tx well  Past Medical History  Diagnosis Date  . ESRD on hemodialysis     ESRD due to HTN, started dialysis 2011 and gets HD at [facility] with Dr Hinda Lenis on MWF schedule.  Access is LUA AVF as of Sept 2014.   Marland Kitchen Chronic bronchitis   . GERD (gastroesophageal reflux disease)   . PUD (peptic ulcer disease)   . History of lower GI bleeding   . Arthritis   . History of gout   . CAD (coronary artery disease)     a. 12/2011 NSTEMI/Cath/PCI LCX (2.25x14 Resolute DES) & D1 (2.25x22 Resolute DES);  b. 01/2012 Cath/PCI: LM 30, LAD 30p, 40-71m, D1 stent ok, 99 in sm branch of diag, LCX patent stent, OM1 20, RCA 95 ost (4.0x12 Promus DES), EF 55%;  c. 04/2012 Lexi Cardiolite  EF 48%, small area of scar @ base/mid inflat wall with mild peri-infarct ischemia.; CABG 12/4  . High cholesterol 12/2011  . Pneumonia ~ 2009  . Iron deficiency anemia   . TIA (transient ischemic attack)   . Anxiety   . History of blood transfusion 07/2011; 12/2011; 01/2012 X 2; 04/2012  . Carotid artery disease     a. [id number] LICA, [id number]   . Mitral regurgitation     a. Moderate by echo, 02/2012  . Myocardial infarction   . Ovarian cancer 1992  . Colon cancer 1992  . Chronic diastolic CHF (congestive heart failure)     a. 02/2012 Echo EF 60-65%, nl wall motion, Gr 1 DD, mod MR  .  Hypertension   . AVM (arteriovenous malformation) of colon   . Esophageal stricture       Family History  Problem Relation Age of Onset  . Other      noncontributory for early CAD  . Heart disease Mother     Heart Disease before age 80  . Hyperlipidemia Mother   . Hypertension Mother   . Diabetes Mother   . Heart attack Mother   . Heart disease Father     Heart Disease before age 53  . Hyperlipidemia Father   . Hypertension Father   . Diabetes Father   . Diabetes Sister   . Diabetes Brother   . Hyperlipidemia Brother     Social History:  reports that she has never smoked. She has never used smokeless tobacco. She reports that she does not drink alcohol or use illicit drugs.   Allergies:  Allergies  Allergen Reactions  . Aspirin Other (See Comments)    Mess up her stomach; "makes my bowels have blood in them". Takes 81 mg EC Aspirin   . Contrast Media [Iodinated Diagnostic Agents] Itching  . Iron Itching and Other (See Comments)    "they gave me iron in dialysis; had to give me Benadryl cause I had to have  the iron" (05/02/2012)  . Macrodantin [Nitrofurantoin Macrocrystal] Other (See Comments)    "broke me out in big old knots all over my body; had to go to ER"  . Penicillins Other (See Comments)    "makes me real weak when I take it; like I'll pass out"  . Plavix [Clopidogrel Bisulfate] Rash  . Bactrim [Sulfamethoxazole-Trimethoprim] Rash  . Sulfa Antibiotics Rash  . Venofer [Ferric Oxide] Itching    Patient reports using Benadryl prior to doses as [facility]  . Dexilant [Dexlansoprazole] Other (See Comments)    Upset stomach  . Prilosec [Omeprazole] Other (See Comments)    "back spasms"  . Levaquin [Levofloxacin In D5w] Rash  . Protonix [Pantoprazole Sodium] Rash    Medications Prior to Admission  Medication Sig Dispense Refill  . albuterol (PROVENTIL HFA;VENTOLIN HFA) 108 (90 BASE) MCG/ACT inhaler Inhale 2 puffs into the lungs every 6 (six) hours as  needed for wheezing or shortness of breath.       . ALPRAZolam (XANAX) 0.5 MG tablet Take 0.5 mg by mouth 3 (three) times daily.      Marland Kitchen aspirin 81 MG chewable tablet Chew 1 tablet (81 mg total) by mouth daily.      . cloNIDine (CATAPRES) 0.2 MG tablet Take 1 tablet (0.2 mg total) by mouth as needed.  30 tablet  1  . fluticasone (FLONASE) 50 MCG/ACT nasal spray Place 2 sprays into the nose daily.      . folic acid (FOLVITE) 1 MG tablet Take 1 mg by mouth daily.      . folic acid-vitamin b complex-vitamin c-selenium-zinc (DIALYVITE) 3 MG TABS Take 1 tablet by mouth daily.      Marland Kitchen labetalol (NORMODYNE) 100 MG tablet Take 1.5 tablets (150 mg total) by mouth 2 (two) times daily.  120 tablet  6  . lanthanum (FOSRENOL) 1000 MG chewable tablet Chew 1,000 mg by mouth 3 (three) times daily after meals.      . lidocaine-prilocaine (EMLA) cream Apply 1 application topically every Monday, Wednesday, and Friday.       . loratadine (CLARITIN) 10 MG tablet Take 10 mg by mouth daily.       Marland Kitchen losartan (COZAAR) 100 MG tablet Take 100 mg by mouth daily.      . nitroGLYCERIN (NITROSTAT) 0.4 MG SL tablet Place 0.4 mg under the tongue every 5 (five) minutes as needed for chest pain.      . polyethylene glycol (MIRALAX / GLYCOLAX) packet Take 17 g by mouth daily as needed for mild constipation.      . SENSIPAR 30 MG tablet Take 30 mg by mouth daily.       . simvastatin (ZOCOR) 20 MG tablet Take 1 tablet (20 mg total) by mouth at bedtime.  90 tablet  3  . traMADol (ULTRAM) 50 MG tablet Take 50 mg by mouth every 6 (six) hours as needed for pain.       Marland Kitchen zolpidem (AMBIEN) 10 MG tablet Take 10 mg by mouth at bedtime as needed for sleep.            [id number] from the symptoms mentioned above,there are no other symptoms referable to all systems reviewed.  Marland Kitchen aspirin  81 mg Oral Daily  . cinacalcet  30 mg Oral QAC breakfast  . cloNIDine  0.1 mg Oral BID  . folic acid  1 mg Oral Daily  . heparin  5,000 Units Subcutaneous  3 times per day  . isosorbide mononitrate  30 mg Oral Daily  . labetalol  150 mg Oral BID  . lanthanum  1,000 mg Oral TID PC  . loratadine  10 mg Oral Daily  . losartan  100 mg Oral Daily  . simvastatin  20 mg Oral QHS  . sodium chloride  3 mL Intravenous Q12H      Physical Exam: Vital signs in last 24 hours: Temp:  [97.7 F (36.5 C)-98.4 F (36.9 C)] 98.2 F (36.8 C) (05/20 0900) Pulse Rate:  [71-97] 74 (05/20 1000) Resp:  [13-22] 16 (05/20 0900) BP: (166-234)/(68-107) 234/100 mmHg (05/20 1000) SpO2:  [94 %-97 %] 97 % (05/20 0900) Weight:  [145 lb 9.6 oz (66.044 kg)-146 lb 1.6 oz (66.271 kg)] 145 lb 11.6 oz (66.1 kg) (05/20 0900) Weight change:  Last BM Date: 11/25/13  Intake/Output from previous day:       Physical Exam: General- pt is awake,alert, oriented to time place and person Resp- No acute REsp distress, CTA B/L NO Rhonchi CVS- S1S2 regular in rate and rhythm GIT- BS+, soft, NT, ND EXT- NO LE Edema, NO Cyanosis CNS- CN 2-12 grossly intact. Moving all 4 extremities Psych- normal mod and affect Access-  AVF two needles in situ   Lab Results: CBC  Recent Labs  11/26/13 1158  WBC 5.6  HGB 11.2*  HCT 35.4*  PLT 206    BMET  Recent Labs  11/26/13 1158  NA 144  K 4.9  CL 97  CO2 32  GLUCOSE 89  BUN 43*  CREATININE 8.74*  CALCIUM 8.3*    MICRO No results found for this or any previous visit (from the past 240 hour(s)).    Lab Results  Component Value Date   PTH 709.7* 05/29/2013   CALCIUM 8.3* 11/26/2013   CAION 1.02* 10/15/2013   PHOS 4.6 06/01/2013      Impression: 1)Renal ESRd on HD On MWF schedule Pt is being  dialyzed today  2)HTN  BP not at goal  But better during  HD Medication- On RAS blockers- On Alpha and beta Blockers  On Central Acting Sympatholytics-  3)Anemia HGb at goal (9--11)   4)CKD Mineral-Bone Disorder Secondary Hyperparathyroidism present    On Cinacalcet Phosphorus at goal.   5)CAD-Admitted  with Chest pain PMD following  6)Electrolytes Normokalemic Normonatremic   7)Acid base Co2 at goal     Plan:  Will continue current care      [place] 11/27/2013, 10:21 AM

## 2013-11-27 NOTE — Consult Note (Addendum)
The patient was seen and examined, and I agree with the assessment and plan as documented above, with modifications as noted below. Very pleasant 73 yr old woman with h/o CABG, HTN, and ESRD as noted above, presenting with chest pain and malignant hypertension. She awoke yesterday and had chest pain when her BP was 169/95. She said her BP has been particularly high around times of dialysis, which is new for her. She had previously been on oral amlodipine and hydralazine in addition to clonidine and labetalol. When I last evaluated her in the office in 09/2013, she had some exertional chest pressure, particularly when her BP was markedly elevated. She has ruled out for an acute coronary syndrome and her ECG, which shows sinus rhythm with LVH and consequent repolarization abnormalities, is unchanged from prior. She also mentioned a 2 week h/o diarrhea after completing a course of Ceftin for sinusitis, but this has since tapered off and now hasn't had a bowel movement in 2 days.  RECS: Will first aim to control BP, with addition of scheduled IV hydralazine in addition to oral amlodipine. Will continue clonidine, labetalol, and losartan. Agree with addition of Imdur. Once BP is optimally controlled, would consider Lexiscan Cardiolite vs coronary angiography depending upon further symptoms.

## 2013-11-27 NOTE — Consult Note (Signed)
Reason for Consult: Chest pain Referring Physician: PTH Cardiologist: Jonathon Jordan Shawna [name] is an 73 y.o. female.  HPI: This is a very pleasant 73 year old female patient of Dr. Bronson Ing who was admitted to hospital with chest pain. She also has end-stage renal disease and is undergoing dialysis today. She has history of coronary artery disease status post drug-eluting stent to the circumflex and diagonal in 12/2011 followed by CABG in 06/2012. She has history of hypertension, hyperlipidemia and mild MR with normal LV function. She also has carotid artery stenosis it continues to be followed.  Yesterday upon awakening she had a discomfort in her chest radiating to her back associated with diaphoresis. She took 2 aspirin and eventually 1 SL NTG which eased it but not total relief. She had more chest tightness last night. She complains of chest tightness and high blood pressure every time she walks to her mailbox, but eases with rest. This has been going on for awhile. She also says her BP has been very high at dialysis the past couple of weeks which is unusual for her. She was last seen in the office 09/2013 and doing well.  Past Medical History  Diagnosis Date  . ESRD on hemodialysis     ESRD due to HTN, started dialysis 2011 and gets HD at [facility] with Dr Hinda Lenis on MWF schedule.  Access is LUA AVF as of Sept 2014.   Marland Kitchen Chronic bronchitis   . GERD (gastroesophageal reflux disease)   . PUD (peptic ulcer disease)   . History of lower GI bleeding   . Arthritis   . History of gout   . CAD (coronary artery disease)     a. 12/2011 NSTEMI/Cath/PCI LCX (2.25x14 Resolute DES) & D1 (2.25x22 Resolute DES);  b. 01/2012 Cath/PCI: LM 30, LAD 30p, 40-69m D1 stent ok, 99 in sm branch of diag, LCX patent stent, OM1 20, RCA 95 ost (4.0x12 Promus DES), EF 55%;  c. 04/2012 Lexi Cardiolite  EF 48%, small area of scar @ base/mid inflat wall with mild peri-infarct ischemia.; CABG 12/4  . High cholesterol 12/2011   . Pneumonia ~ 2009  . Iron deficiency anemia   . TIA (transient ischemic attack)   . Anxiety   . History of blood transfusion 07/2011; 12/2011; 01/2012 X 2; 04/2012  . Carotid artery disease     a. 627-25%LICA, 93/6644  . Mitral regurgitation     a. Moderate by echo, 02/2012  . Myocardial infarction   . Ovarian cancer 1992  . Colon cancer 1992  . Chronic diastolic CHF (congestive heart failure)     a. 02/2012 Echo EF 60-65%, nl wall motion, Gr 1 DD, mod MR  . Hypertension   . AVM (arteriovenous malformation) of colon   . Esophageal stricture     Past Surgical History  Procedure Laterality Date  . Abdominal hysterectomy  1992  . Appendectomy  06/1990  . Tubal ligation  1980's  . Av fistula placement  07/2009    left upper arm  . Thrombectomy / arteriovenous graft revision  2011    left upper arm  . Colon resection  1992  . Esophagogastroduodenoscopy  01/20/2012    Procedure: ESOPHAGOGASTRODUODENOSCOPY (EGD);  Surgeon: MLadene Artist MD,FACG;  [facility];  Service: Endoscopy;  Laterality: N/A;  . Dilation and curettage of uterus    . Coronary angioplasty with stent placement  12/15/11    "2"  . Coronary angioplasty with stent placement  y/2013    "  1; makes total of 3" (05/02/2012)  . Coronary artery bypass graft  06/13/2012    Procedure: CORONARY ARTERY BYPASS GRAFTING (CABG);  Surgeon: Grace Isaac, MD;  Location: [place];  Service: Open Heart Surgery;  Laterality: N/A;  cabg x four;  using left internal mammary artery, and left leg greater saphenous vein harvested endoscopically  . Intraoperative transesophageal echocardiogram  06/13/2012    Procedure: INTRAOPERATIVE TRANSESOPHAGEAL ECHOCARDIOGRAM;  Surgeon: Grace Isaac, MD;  Location: [place];  Service: Open Heart Surgery;  Laterality: N/A;  . Colectomy  1992  . Esophagogastroduodenoscopy N/A 03/26/2013    Procedure: ESOPHAGOGASTRODUODENOSCOPY (EGD);  Surgeon: Irene Shipper, MD;  Location[facility];  Service:  Endoscopy;  Laterality: N/A;  . Stomach surgery      Family History  Problem Relation Age of Onset  . Other      noncontributory for early CAD  . Heart disease Mother     Heart Disease before age 31  . Hyperlipidemia Mother   . Hypertension Mother   . Diabetes Mother   . Heart attack Mother   . Heart disease Father     Heart Disease before age 31  . Hyperlipidemia Father   . Hypertension Father   . Diabetes Father   . Diabetes Sister   . Diabetes Brother   . Hyperlipidemia Brother     Social History:  reports that she has never smoked. She has never used smokeless tobacco. She reports that she does not drink alcohol or use illicit drugs.  Allergies:  Allergies  Allergen Reactions  . Aspirin Other (See Comments)    Mess up her stomach; "makes my bowels have blood in them". Takes 81 mg EC Aspirin   . Contrast Media [Iodinated Diagnostic Agents] Itching  . Iron Itching and Other (See Comments)    "they gave me iron in dialysis; had to give me Benadryl cause I had to have the iron" (05/02/2012)  . Macrodantin [Nitrofurantoin Macrocrystal] Other (See Comments)    "broke me out in big old knots all over my body; had to go to ER"  . Penicillins Other (See Comments)    "makes me real weak when I take it; like I'll pass out"  . Plavix [Clopidogrel Bisulfate] Rash  . Bactrim [Sulfamethoxazole-Trimethoprim] Rash  . Sulfa Antibiotics Rash  . Venofer [Ferric Oxide] Itching    Patient reports using Benadryl prior to doses as [facility]  . Dexilant [Dexlansoprazole] Other (See Comments)    Upset stomach  . Prilosec [Omeprazole] Other (See Comments)    "back spasms"  . Levaquin [Levofloxacin In D5w] Rash  . Protonix [Pantoprazole Sodium] Rash    Medications: Scheduled Meds: . aspirin  81 mg Oral Daily  . cinacalcet  30 mg Oral QAC breakfast  . cloNIDine  0.1 mg Oral Daily  . folic acid  1 mg Oral Daily  . heparin  5,000 Units Subcutaneous 3 times per day  . labetalol   150 mg Oral BID  . lanthanum  1,000 mg Oral TID PC  . loratadine  10 mg Oral Daily  . losartan  100 mg Oral Daily  . simvastatin  20 mg Oral QHS  . sodium chloride  3 mL Intravenous Q12H   Continuous Infusions: . sodium chloride 10 mL/hr at 11/27/13 0002   PRN Meds:.sodium chloride, sodium chloride, sodium chloride, acetaminophen, acetaminophen, albuterol, ALPRAZolam, alteplase, feeding supplement (NEPRO CARB STEADY), heparin, heparin, hydrALAZINE, lidocaine (PF), lidocaine-prilocaine, nitroGLYCERIN, ondansetron (ZOFRAN) IV, ondansetron, pentafluoroprop-tetrafluoroeth, sodium chloride,  zolpidem   Results for orders placed during the hospital encounter of 11/26/13 (from the past 48 hour(s))  CBC WITH DIFFERENTIAL     Status: Abnormal   Collection Time    11/26/13 11:58 AM      Result Value Ref Range   WBC 5.6  4.0 - 10.5 K/uL   RBC 3.43 (*) 3.87 - 5.11 MIL/uL   Hemoglobin 11.2 (*) 12.0 - 15.0 g/dL   HCT 35.4 (*) 36.0 - 46.0 %   MCV 103.2 (*) 78.0 - 100.0 fL   MCH 32.7  26.0 - 34.0 pg   MCHC 31.6  30.0 - 36.0 g/dL   RDW 15.2  11.5 - 15.5 %   Platelets 206  150 - 400 K/uL   Neutrophils Relative % 67  43 - 77 %   Neutro Abs 3.8  1.7 - 7.7 K/uL   Lymphocytes Relative 17  12 - 46 %   Lymphs Abs 1.0  0.7 - 4.0 K/uL   Monocytes Relative 8  3 - 12 %   Monocytes Absolute 0.4  0.1 - 1.0 K/uL   Eosinophils Relative 7 (*) 0 - 5 %   Eosinophils Absolute 0.4  0.0 - 0.7 K/uL   Basophils Relative 1  0 - 1 %   Basophils Absolute 0.1  0.0 - 0.1 K/uL  BASIC METABOLIC PANEL     Status: Abnormal   Collection Time    11/26/13 11:58 AM      Result Value Ref Range   Sodium 144  137 - 147 mEq/L   Potassium 4.9  3.7 - 5.3 mEq/L   Chloride 97  96 - 112 mEq/L   CO2 32  19 - 32 mEq/L   Glucose, Bld 89  70 - 99 mg/dL   BUN 43 (*) 6 - 23 mg/dL   Creatinine, Ser 8.74 (*) 0.50 - 1.10 mg/dL   Calcium 8.3 (*) 8.4 - 10.5 mg/dL   GFR calc non Af Amer 4 (*) >90 mL/min   GFR calc Af Amer 5 (*) >90 mL/min    Comment: (NOTE)     The eGFR has been calculated using the CKD EPI equation.     This calculation has not been validated in all clinical situations.     eGFR's persistently <90 mL/min signify possible Chronic Kidney     Disease.  TROPONIN I     Status: None   Collection Time    11/26/13 11:58 AM      Result Value Ref Range   Troponin I <0.30  <0.30 ng/mL   Comment:            Due to the release kinetics of cTnI,     a negative result within the first hours     of the onset of symptoms does not rule out     myocardial infarction with certainty.     If myocardial infarction is still suspected,     repeat the test at appropriate intervals.  TROPONIN I     Status: None   Collection Time    11/26/13  3:55 PM      Result Value Ref Range   Troponin I <0.30  <0.30 ng/mL   Comment:            Due to the release kinetics of cTnI,     a negative result within the first hours     of the onset of symptoms does not rule out     myocardial infarction  with certainty.     If myocardial infarction is still suspected,     repeat the test at appropriate intervals.  TROPONIN I     Status: None   Collection Time    11/26/13  9:50 PM      Result Value Ref Range   Troponin I <0.30  <0.30 ng/mL   Comment:            Due to the release kinetics of cTnI,     a negative result within the first hours     of the onset of symptoms does not rule out     myocardial infarction with certainty.     If myocardial infarction is still suspected,     repeat the test at appropriate intervals.  TROPONIN I     Status: None   Collection Time    11/27/13  3:00 AM      Result Value Ref Range   Troponin I <0.30  <0.30 ng/mL   Comment:            Due to the release kinetics of cTnI,     a negative result within the first hours     of the onset of symptoms does not rule out     myocardial infarction with certainty.     If myocardial infarction is still suspected,     repeat the test at appropriate intervals.     Dg Chest Portable 1 View  11/26/2013   CLINICAL DATA:  Left-sided chest pain  EXAM: PORTABLE CHEST - 1 VIEW  COMPARISON:  10/03/2013  FINDINGS: There are mild chronic bronchitic changes. There is mild interstitial prominence which is accentuated by low lung volumes. There is no focal parenchymal opacity, pleural effusion, or pneumothorax. The heart size is top-normal. There is evidence of prior CABG. The osseous structures are unremarkable.  IMPRESSION: No active disease.   Electronically Signed   By: Kathreen Devoid   On: 11/26/2013 11:44    ROS See HPI Eyes: Negative Ears:Negative for hearing loss, tinnitus Cardiovascular: Negative for  palpitations,irregular heartbeat, dyspnea, dyspnea on exertion, near-syncope, orthopnea, paroxysmal nocturnal dyspnea and syncope,edema, claudication, cyanosis,.  Respiratory:   Negative for cough, hemoptysis, shortness of breath, sleep disturbances due to breathing, sputum production and wheezing.   Endocrine: Negative for cold intolerance and heat intolerance.  Hematologic/Lymphatic: Negative for adenopathy and bleeding problem. Does not bruise/bleed easily.  Musculoskeletal: Negative.   Gastrointestinal: Negative for nausea, vomiting, reflux, abdominal pain, diarrhea, constipation.   Genitourinary: Negative for bladder incontinence, dysuria, flank pain, frequency, hematuria, hesitancy, nocturia and urgency.  Neurological: Negative.  Allergic/Immunologic: Negative for environmental allergies.   Blood pressure 166/77, pulse 81, temperature 97.7 F (36.5 C), temperature source Oral, resp. rate 20, height _0  (1.549 m), weight 145 lb 9.6 oz (66.044 kg), SpO2 94.00%. Physical Exam PHYSICAL EXAM: Well-nournished, in no acute distress. Neck: No JVD, HJR, Bruit, or thyroid enlargement Lungs: No tachypnea, clear without wheezing, rales, or rhonchi Cardiovascular: RRR, PMI not displaced, positive S4, 2/6 systolic murmur LSB, no bruit, thrill, or  heave. Abdomen: BS normal. Soft without organomegaly, masses, lesions or tenderness. Extremities: without cyanosis, clubbing or edema. Good distal pulses bilateral SKin: Warm, no lesions or rashes  Musculoskeletal: No deformities Neuro: no focal signs  2Decho 04/2013: Study Conclusions  - Left ventricle: The cavity size was normal. There was   moderate concentric hypertrophy. Systolic function was   normal. The estimated ejection fraction was 55%. Wall   motion was normal;  there were no regional wall motion   abnormalities. Doppler parameters are consistent with   abnormal left ventricular relaxation (grade 1 diastolic   dysfunction). Doppler parameters are consistent with high   ventricular filling pressure. - Aortic valve: Mildly calcified annulus. Trileaflet. No   significant regurgitation. Mean gradient: 74m Hg (S). - Mitral valve: Mildly thickened leaflets . Mild   regurgitation directed posteriorly - two jets noted.   Regurgitant volume: 265m(PISA). - Left atrium: The atrium was at the upper limits of normal   in size. - Right atrium: Central venous pressure: 64m29mg (est). - Atrial septum: No defect or patent foramen ovale was   identified. - Tricuspid valve: Trivial regurgitation. - Pulmonary arteries: PA peak pressure: 364m46m (S). - Pericardium, extracardiac: There was no pericardial   effusion. Impressions:  - Comparison to TEE from December 2013. Moderate LVH with   LVEF approximately 55%,17%ade 1 diastolic dysfunction with   increasd filling pressures. Inferior hypokinesis no longer   evident. Upper normal left atrial size. Mild, posteriorly   directed mitral regurgitation in two jets, regurgitant   volume 20 ml - appears less significant compared to prior   study. PASP 33 mmHg.     EKG normal sinus rhythm with LVH and old septal infarct unchanged from prior tracing  Assessment/Plan:  Chest pain: worrisome for ischemia. Will discuss possible cath vs  myoview with Dr. KoneBronson Ingd Imdur  CAD S/P CABG 06/2012  ESRD currently on Hemodialysis  Mild MR/Normal LV function  HTN: BP up. Could increase her catapress.  Carotid disease  Less than <40% in 07/2013  Hyperlipidemia  MichImogene Burn0/2015, 8:22 AM

## 2013-11-27 NOTE — Progress Notes (Signed)
Patient seen and examined. Above note reviewed  She has been admitted with chest pain and severe hypertension. Patient is on multiple antihypertensives at home including clonidine. She reports taking her clonidine inconsistently, depending on what her blood pressure is doing on that specific day. She was educated on the importance of consistency in taking clonidine to avoid rebound hypertension. Patient is also a dialysis patient. She underwent dialysis today and reports improvement of her symptoms since then. She was seen by cardiology for chest pain and felt that she may need further ischemic workup including stress test versus cardiac catheterization. Initially, goal will be to control her blood pressure. Amlodipine and as needed hydralazine have been added to her regimen. We'll continue to follow.  [employer]

## 2013-11-28 ENCOUNTER — Other Ambulatory Visit: Payer: Self-pay | Admitting: *Deleted

## 2013-11-28 DIAGNOSIS — R55 Syncope and collapse: Secondary | ICD-10-CM

## 2013-11-28 DIAGNOSIS — I998 Other disorder of circulatory system: Secondary | ICD-10-CM

## 2013-11-28 MED ORDER — AMLODIPINE BESYLATE 5 MG PO TABS: 5.0000 mg | ORAL_TABLET | Freq: Every day | ORAL | Status: DC

## 2013-11-28 MED ORDER — CLONIDINE HCL 0.1 MG PO TABS: 0.1000 mg | ORAL_TABLET | Freq: Two times a day (BID) | ORAL | Status: DC

## 2013-11-28 MED ORDER — ISOSORBIDE DINITRATE 10 MG PO TABS: 10.0000 mg | ORAL_TABLET | Freq: Three times a day (TID) | ORAL | Status: DC

## 2013-11-28 NOTE — Progress Notes (Signed)
Subjective:  Feeling much better today. BP dropped quickly to [id number] systolic yesterday after IV hydralazine. She felt like she would pass out. No further chest pain.  Objective:  Vital Signs in the last 24 hours: Temp:  [98.1 F (36.7 C)-98.6 F (37 C)] 98.4 F (36.9 C) (05/21 0627) Pulse Rate:  [61-88] 81 (05/21 0955) Resp:  [16-19] 19 (05/21 0627) BP: (110-243)/(53-121) 132/72 mmHg (05/21 0955) SpO2:  [95 %-98 %] 95 % (05/21 0627) Weight:  [144 lb 10 oz (65.6 kg)-146 lb 3.2 oz (66.316 kg)] 146 lb 3.2 oz (66.316 kg) (05/21 0627)  Intake/Output from previous day: 05/20 0701 - 05/21 0700 In: 240 [P.O.:240] Out: 510  Intake/Output from this shift:    Physical Exam: NECK: Without JVD, HJR, or bruit LUNGS: Clear anterior, posterior, lateral HEART: Regular rate and rhythm, no murmur, gallop, rub, bruit, thrill, or heave EXTREMITIES: Without cyanosis, clubbing, or edema   Lab Results:  Recent Labs  11/26/13 1158  WBC 5.6  HGB 11.2*  PLT 206    Recent Labs  11/26/13 1158  NA 144  K 4.9  CL 97  CO2 32  GLUCOSE 89  BUN 43*  CREATININE 8.74*    Recent Labs  11/26/13 2150 11/27/13 0300  TROPONINI <0.30 <0.30   Hepatic Function Panel No results found for this basename: PROT, ALBUMIN, AST, ALT, ALKPHOS, BILITOT, BILIDIR, IBILI,  in the last 72 hours No results found for this basename: CHOL,  in the last 72 hours No results found for this basename: PROTIME,  in the last 72 hours  Imaging:   Cardiac Studies: 2Decho 11/27/13: Study Conclusions  - Left ventricle: The cavity size was normal. Wall thickness was   increased in a pattern of moderate LVH. Systolic function was   normal. The estimated ejection fraction was in the range of 55%   to 60%. Wall motion was normal; there were no regional wall   motion abnormalities. Doppler parameters are consistent with   abnormal left ventricular relaxation (grade 1 diastolic   dysfunction). Doppler parameters are  consistent with high   ventricular filling pressure. - Aortic valve: Mildly calcified annulus. Trileaflet; mildly   thickened leaflets. There was no stenosis. - Mitral valve: Mildly thickened leaflets . There was mild to   moderate, posteriorly directed regurgitation. - Left atrium: The atrium was mildly dilated. - Tricuspid valve: There was mild regurgitation. - Pulmonary arteries: PA peak pressure: 41 mm Hg (S). Mildly   elevated pulmonary pressures.   Assessment/Plan:  Chest pain: worrisome for ischemia. No further chest pain. Patient prefers myoview for further work up. Can do as outpatient.  CAD S/P CABG 06/2012  ESRD currently on Hemodialysis  Mild-moderated MR/Normal LV function with grade 1 diastolic dysfunction on echo yest.  HTN: BP up and down. Discussed with renal MD who says patient takes her meds based on her BP at home. Once it comes down, she doesn't take her meds the rest of the day. Needs better compliance.  Carotid disease  Less than <40% in 07/2013  Hyperlipidemia   LOS: 2 days    Imogene Burn 11/28/2013, 10:09 AM

## 2013-11-28 NOTE — Discharge Summary (Signed)
Physician Discharge Summary  Shawna [name] [id number] DOB: 26-Jan-1941 DOA: 11/26/2013  PCP: Shawna Percy, MD  Admit date: 11/26/2013 Discharge date: 11/28/2013  Time spent: 40 minutes  Recommendations for Outpatient Follow-up:  1. Follow up with PCP in 1 week for evaluation of BP management 2. Cardiology will contact for outpatient stress test 3. Resume dialysis schedule Discharge Diagnoses:  Principal Problem:   Chest pain Active Problems:   Coronary atherosclerosis of native coronary artery   ESRD on hemodialysis   Anemia of chronic kidney failure   Carotid artery disease   Chronic systolic CHF (congestive heart failure)   Discharge Condition: stable  Diet recommendation: renal/heart healthy/carb modified  Filed Weights   11/27/13 0900 11/27/13 1300 11/28/13 0627  Weight: 66.1 kg (145 lb 11.6 oz) 65.6 kg (144 lb 10 oz) 66.316 kg (146 lb 3.2 oz)    History of present illness:  Shawna [name] is a 73 y.o. female with PMH significant for CAD S/P CABG 2013, History AVM malformation colon, Diastolic Heart Failure, ESRD on hemodialysis M, W, F who presented to ED on 11/26/13 complaining of chest pain. She related chest pain, left side of her chest, sharp in quality, lasted for 2 to 3 hours. The chest pain decreased in intensity after nitroglycerin. It was not associated with Dyspnea. She was chest pain free at time of presentation. She reported soreness on palpation of her chest earlier. She also reported Chest pain on exertion for last 2 month. She also mentioned diarrhea for previous 2 to 3 days, watery, 2 to 3 BM per day. She finished recently antibiotics for sinusitis. She related some abdominal discomfort.   Hospital Course:  1-Chest pain; With atypical and typical feature. She reported chest pain on exertion. In patient with known CAD, multiple risk factors. Was admitted for ACS rule out.  Cardiac enzymes negative x3. ECHO with moderate LVH, EF 55-60% and grade 1 diastolic  dysfunction. Continue aspirin and statin. Evaluated by Cardiology who opine no ACS but may need Lexiscan Cardiolite. BP somewhat labile during hospitalization. Medications adjusted. BP stable and cardiology and patient decided to schedule OP stress test. No further episodes of chest pain.   2-Hypertension; Uncontrolled. She related she was having trouble controlling BP at home. She takes half tablet of clonidine if her BP is elevated.  She missed dose of clonidine the day prior to admission. Evaluated by cardiology who adjusted medications. Patient educated several times regarding importance of medication compliance. At discharge her BP controlled.   3-Diarrhea; patient on hemodialysis, recent antibiotic use. No further diarrhea.   4-ESRD; On hemodialysis M, W, F; appreciate Nephrologist assistance. Dialysis 11/28/23  5-Anxiety; continue with Xanax PRN   Procedures:  Dialysis 11/27/13 Echo wall thickness was increased in a pattern of moderate LVH. Systolic function was normal. The estimated ejection fraction was in the range of 55% to 60%. Wall motion was normal; there were no regional wall motion abnormalities. Doppler parameters are consistent with abnormal left ventricular relaxation (grade 1 diastolic dysfunction). Doppler parameters are consistent with high ventricular filling pressure.   Consultations:  cardiology  Discharge Exam: Filed Vitals:   11/28/13 0955  BP: 132/72  Pulse: 81  Temp:   Resp:     General: well nourished NAD Cardiovascular: RRR no MGR No LE edema Respiratory: normal effort BS clear bilaterally no wheeze  Discharge Instructions You were cared for by a hospitalist during your hospital stay. If you have any questions about your discharge medications or the care you  received while you were in the hospital after you are discharged, you can call the unit and asked to speak with the hospitalist on call if the hospitalist that took care of you is not available.  Once you are discharged, your primary care physician will handle any further medical issues. Please note that NO REFILLS for any discharge medications will be authorized once you are discharged, as it is imperative that you return to your primary care physician (or establish a relationship with a primary care physician if you do not have one) for your aftercare needs so that they can reassess your need for medications and monitor your lab values.     Medication List         albuterol 108 (90 BASE) MCG/ACT inhaler  Commonly known as:  PROVENTIL HFA;VENTOLIN HFA  Inhale 2 puffs into the lungs every 6 (six) hours as needed for wheezing or shortness of breath.     ALPRAZolam 0.5 MG tablet  Commonly known as:  XANAX  Take 0.5 mg by mouth 3 (three) times daily.     amLODipine 5 MG tablet  Commonly known as:  NORVASC  Take 1 tablet (5 mg total) by mouth daily.     aspirin 81 MG chewable tablet  Chew 1 tablet (81 mg total) by mouth daily.     cloNIDine 0.1 MG tablet  Commonly known as:  CATAPRES  Take 1 tablet (0.1 mg total) by mouth 2 (two) times daily.     fluticasone 50 MCG/ACT nasal spray  Commonly known as:  FLONASE  Place 2 sprays into the nose daily.     folic acid 1 MG tablet  Commonly known as:  FOLVITE  Take 1 mg by mouth daily.     folic acid-vitamin b complex-vitamin c-selenium-zinc 3 MG Tabs tablet  Take 1 tablet by mouth daily.     isosorbide dinitrate 10 MG tablet  Commonly known as:  ISORDIL  Take 1 tablet (10 mg total) by mouth 3 (three) times daily.     labetalol 100 MG tablet  Commonly known as:  NORMODYNE  Take 1.5 tablets (150 mg total) by mouth 2 (two) times daily.     lanthanum 1000 MG chewable tablet  Commonly known as:  FOSRENOL  Chew 1,000 mg by mouth 3 (three) times daily after meals.     lidocaine-prilocaine cream  Commonly known as:  EMLA  Apply 1 application topically every Monday, Wednesday, and Friday.     loratadine 10 MG tablet  Commonly  known as:  CLARITIN  Take 10 mg by mouth daily.     losartan 100 MG tablet  Commonly known as:  COZAAR  Take 100 mg by mouth daily.     nitroGLYCERIN 0.4 MG SL tablet  Commonly known as:  NITROSTAT  Place 0.4 mg under the tongue every 5 (five) minutes as needed for chest pain.     polyethylene glycol packet  Commonly known as:  MIRALAX / GLYCOLAX  Take 17 g by mouth daily as needed for mild constipation.     SENSIPAR 30 MG tablet  Generic drug:  cinacalcet  Take 30 mg by mouth daily.     simvastatin 20 MG tablet  Commonly known as:  ZOCOR  Take 1 tablet (20 mg total) by mouth at bedtime.     traMADol 50 MG tablet  Commonly known as:  ULTRAM  Take 50 mg by mouth every 6 (six) hours as needed for pain.  zolpidem 10 MG tablet  Commonly known as:  AMBIEN  Take 10 mg by mouth at bedtime as needed for sleep.       Allergies  Allergen Reactions  . Aspirin Other (See Comments)    Mess up her stomach; "makes my bowels have blood in them". Takes 81 mg EC Aspirin   . Contrast Media [Iodinated Diagnostic Agents] Itching  . Iron Itching and Other (See Comments)    "they gave me iron in dialysis; had to give me Benadryl cause I had to have the iron" (05/02/2012)  . Macrodantin [Nitrofurantoin Macrocrystal] Other (See Comments)    "broke me out in big old knots all over my body; had to go to ER"  . Penicillins Other (See Comments)    "makes me real weak when I take it; like I'll pass out"  . Plavix [Clopidogrel Bisulfate] Rash  . Bactrim [Sulfamethoxazole-Trimethoprim] Rash  . Sulfa Antibiotics Rash  . Venofer [Ferric Oxide] Itching    Patient reports using Benadryl prior to doses as [facility]  . Dexilant [Dexlansoprazole] Other (See Comments)    Upset stomach  . Prilosec [Omeprazole] Other (See Comments)    "back spasms"  . Levaquin [Levofloxacin In D5w] Rash  . Protonix [Pantoprazole Sodium] Rash       Follow-up Information   Follow up with Shawna Percy, MD.  Schedule an appointment as soon as possible for a visit in 1 week. (evaluation of symptoms)    Specialty:  Family Medicine   Contact information:   [address] [place] [address] [phone]        The results of significant diagnostics from this hospitalization (including imaging, microbiology, ancillary and laboratory) are listed below for reference.    Significant Diagnostic Studies: Dg Chest Portable 1 View  11/26/2013   CLINICAL DATA:  Left-sided chest pain  EXAM: PORTABLE CHEST - 1 VIEW  COMPARISON:  10/03/2013  FINDINGS: There are mild chronic bronchitic changes. There is mild interstitial prominence which is accentuated by low lung volumes. There is no focal parenchymal opacity, pleural effusion, or pneumothorax. The heart size is top-normal. There is evidence of prior CABG. The osseous structures are unremarkable.  IMPRESSION: No active disease.   Electronically Signed   By: Kathreen Devoid   On: 11/26/2013 11:44    Microbiology: No results found for this or any previous visit (from the past 240 hour(s)).   Labs: Basic Metabolic Panel:  Recent Labs Lab 11/26/13 1158  NA 144  K 4.9  CL 97  CO2 32  GLUCOSE 89  BUN 43*  CREATININE 8.74*  CALCIUM 8.3*   Liver Function Tests: No results found for this basename: AST, ALT, ALKPHOS, BILITOT, PROT, ALBUMIN,  in the last 168 hours No results found for this basename: LIPASE, AMYLASE,  in the last 168 hours No results found for this basename: AMMONIA,  in the last 168 hours CBC:  Recent Labs Lab 11/26/13 1158  WBC 5.6  NEUTROABS 3.8  HGB 11.2*  HCT 35.4*  MCV 103.2*  PLT 206   Cardiac Enzymes:  Recent Labs Lab 11/26/13 1158 11/26/13 1555 11/26/13 2150 11/27/13 0300  TROPONINI <0.30 <0.30 <0.30 <0.30   BNP: BNP (last 3 results)  Recent Labs  12/16/12 1810  PROBNP 8774.0*   CBG: No results found for this basename: GLUCAP,  in the last 168 hours     Signed:  Lezlie Octave Yetta Marceaux  [facility] 11/28/2013, 12:22 PM

## 2013-11-28 NOTE — Discharge Summary (Signed)
Patient seen and examined. Above not reviewed.  She is feeling significantly improved today. Blood pressure appears to have improved with addition of new medications. She is currently on labetalol, losartan, amlodipine, isosorbide dinitrate, clonidine. An extensive amount of time was spent with patient education and reinforcing the importance of medication compliance. We will arrange for home health RN to assist with medication compliance. She is ruled out for ACS with negative cardiac markers and has not had any further chest pain. She will follow up with cardiology as an outpatient to consider stress test. She will continue her dialysis per schedule. She has otherwise been cleared for discharge home.  [employer]

## 2013-11-28 NOTE — Progress Notes (Signed)
The patient was seen and examined, and I agree with the assessment and plan as documented above, with modifications as noted below. No further chest pain. BP stable. I strongly encouraged medication compliance. Echo results as noted above. Will plan for outpatient Lexiscan stress test and then f/u with me. Stable for discharge. Discussed with Dr. Roderic Palau.

## 2013-11-28 NOTE — Progress Notes (Signed)
Subjective: Interval History: has complaints chest pain. The pain however is getting better. Patient denies any difficulty in breathing. She also denies any diarrhea, no nausea or vomiting. Overall she sees some improvement..  Objective: Vital signs in last 24 hours: Temp:  [98.1 F (36.7 C)-98.6 F (37 C)] 98.4 F (36.9 C) (05/21 0627) Pulse Rate:  [61-97] 75 (05/21 0627) Resp:  [16-19] 19 (05/21 0627) BP: (110-243)/(53-121) 149/61 mmHg (05/21 0627) SpO2:  [95 %-98 %] 95 % (05/21 0627) Weight:  [65.6 kg (144 lb 10 oz)-66.316 kg (146 lb 3.2 oz)] 66.316 kg (146 lb 3.2 oz) (05/21 0627) Weight change: -0.171 kg (-6 oz)  Intake/Output from previous day: 05/20 0701 - 05/21 0700 In: 240 [P.O.:240] Out: 510  Intake/Output this shift:    General appearance: alert, cooperative and no distress Resp: clear to auscultation bilaterally Cardio: regular rate and rhythm, S1, S2 normal, no murmur, click, rub or gallop GI: soft, non-tender; bowel sounds normal; no masses,  no organomegaly Extremities: extremities normal, atraumatic, no cyanosis or edema  Lab Results:  Recent Labs  11/26/13 1158  WBC 5.6  HGB 11.2*  HCT 35.4*  PLT 206   BMET:  Recent Labs  11/26/13 1158  NA 144  K 4.9  CL 97  CO2 32  GLUCOSE 89  BUN 43*  CREATININE 8.74*  CALCIUM 8.3*   No results found for this basename: PTH,  in the last 72 hours Iron Studies: No results found for this basename: IRON, TIBC, TRANSFERRIN, FERRITIN,  in the last 72 hours  Studies/Results: Dg Chest Portable 1 View  11/26/2013   CLINICAL DATA:  Left-sided chest pain  EXAM: PORTABLE CHEST - 1 VIEW  COMPARISON:  10/03/2013  FINDINGS: There are mild chronic bronchitic changes. There is mild interstitial prominence which is accentuated by low lung volumes. There is no focal parenchymal opacity, pleural effusion, or pneumothorax. The heart size is top-normal. There is evidence of prior CABG. The osseous structures are unremarkable.   IMPRESSION: No active disease.   Electronically Signed   By: Kathreen Devoid   On: 11/26/2013 11:44    I have reviewed the patient's current medications.  Assessment/Plan: Problem #1 chest pain: This is a recurrent issue. Presently patient says that she's feeling better. Patient is being followed by cardiology. Problem #2 end-stage renal disease: She status post hemodialysis yesterday. Presently she doesn't have any nausea vomiting. Her potassium is okay. Problem #3 anemia: Her hemoglobin and hematocrit is range Problem #4 metabolic bone disease: Her calcium is range. Patient on a binder. Problem #5 hypertension: Her blood pressure with extreme fluctuation. Possibly there is a component of anxiety which will affect her blood pressure. At times her blood pressure response dramatically to antihypertensive medications. Problem #6 history of CHF: Presently patient doesn't have any sign of fluid overload. Problem #7 coronary artery disease: She status post CABG. Presently came with chest pain. Plan: We'll make arrangements for patient to get dialysis tomorrow We'll check her basic metabolic panel, phosphorus in the morning.   LOS: 2 days   Savalas Monje S Pal Shell 11/28/2013,8:19 AM

## 2013-12-06 ENCOUNTER — Encounter (HOSPITAL_COMMUNITY): Payer: Medicare Other

## 2013-12-06 ENCOUNTER — Other Ambulatory Visit (HOSPITAL_COMMUNITY): Payer: Medicare Other

## 2013-12-06 ENCOUNTER — Encounter: Payer: Self-pay | Admitting: Cardiovascular Disease

## 2013-12-06 ENCOUNTER — Telehealth: Payer: Self-pay | Admitting: Cardiovascular Disease

## 2013-12-06 ENCOUNTER — Other Ambulatory Visit: Payer: Self-pay | Admitting: Cardiovascular Disease

## 2013-12-06 ENCOUNTER — Encounter: Payer: Self-pay | Admitting: *Deleted

## 2013-12-06 ENCOUNTER — Ambulatory Visit (INDEPENDENT_AMBULATORY_CARE_PROVIDER_SITE_OTHER): Payer: Medicare Other | Admitting: Cardiovascular Disease

## 2013-12-06 VITALS — BP 163/68 | HR 74 | Ht 61.0 in | Wt 148.8 lb

## 2013-12-06 DIAGNOSIS — I5032 Chronic diastolic (congestive) heart failure: Secondary | ICD-10-CM

## 2013-12-06 DIAGNOSIS — I25810 Atherosclerosis of coronary artery bypass graft(s) without angina pectoris: Secondary | ICD-10-CM

## 2013-12-06 DIAGNOSIS — I340 Nonrheumatic mitral (valve) insufficiency: Secondary | ICD-10-CM

## 2013-12-06 DIAGNOSIS — Z951 Presence of aortocoronary bypass graft: Secondary | ICD-10-CM

## 2013-12-06 DIAGNOSIS — N186 End stage renal disease: Secondary | ICD-10-CM

## 2013-12-06 DIAGNOSIS — I779 Disorder of arteries and arterioles, unspecified: Secondary | ICD-10-CM

## 2013-12-06 DIAGNOSIS — E7800 Pure hypercholesterolemia, unspecified: Secondary | ICD-10-CM

## 2013-12-06 DIAGNOSIS — Z01810 Encounter for preprocedural cardiovascular examination: Secondary | ICD-10-CM

## 2013-12-06 DIAGNOSIS — R079 Chest pain, unspecified: Secondary | ICD-10-CM

## 2013-12-06 DIAGNOSIS — I10 Essential (primary) hypertension: Secondary | ICD-10-CM

## 2013-12-06 DIAGNOSIS — I059 Rheumatic mitral valve disease, unspecified: Secondary | ICD-10-CM

## 2013-12-06 DIAGNOSIS — I200 Unstable angina: Secondary | ICD-10-CM

## 2013-12-06 DIAGNOSIS — Z79899 Other long term (current) drug therapy: Secondary | ICD-10-CM

## 2013-12-06 DIAGNOSIS — I739 Peripheral vascular disease, unspecified: Secondary | ICD-10-CM

## 2013-12-06 DIAGNOSIS — I509 Heart failure, unspecified: Secondary | ICD-10-CM

## 2013-12-06 MED ORDER — PREDNISONE 20 MG PO TABS: ORAL_TABLET | ORAL | Status: DC

## 2013-12-06 MED ORDER — DIPHENHYDRAMINE HCL 25 MG PO CAPS: ORAL_CAPSULE | ORAL | Status: DC

## 2013-12-06 MED ORDER — RANITIDINE HCL 150 MG PO CAPS: ORAL_CAPSULE | ORAL | Status: DC

## 2013-12-06 NOTE — Telephone Encounter (Signed)
No precert required for CPT 93459 per [facility] online.  Will put copy in patient referral for future reference.

## 2013-12-06 NOTE — Telephone Encounter (Signed)
Left heart cath - Tuesday, June 2 - 12:30 - [place] Lack

## 2013-12-06 NOTE — Progress Notes (Signed)
Patient ID: Shawna [name], female   DOB: [birth year], 73 y.o.   MRN: [id number]      SUBJECTIVE: Mrs. Bernett is here for post hospitalization followup. She had chest pain and malignant hypertension. At that time I had recommended an outpatient nuclear stress test. An echocardiogram during her hospitalization demonstrated normal left ventricular systolic function, EF 0000000, moderate LVH, grade 1 diastolic dysfunction, elevated filling pressures, mild to moderate posteriorly directed mitral regurgitation, and mild tricuspid regurgitation.  She does not take all of her blood pressure medications on dialysis days, as she is had episodes of low blood pressure on these days in the past. However, most recently she has been having systolic blood pressure readings of 199 and 211 on dialysis days. When it gets this high, she experienced chest pain. She awoke with chest pain last night and took an aspirin and it was relieved within 30 minutes. She's had some abdominal fullness and reportedly underwent an MRI. Her blood pressure cuff has also been giving discordant readings.    Allergies  Allergen Reactions  . Aspirin Other (See Comments)    Mess up her stomach; "makes my bowels have blood in them". Takes 81 mg EC Aspirin   . Contrast Media [Iodinated Diagnostic Agents] Itching  . Iron Itching and Other (See Comments)    "they gave me iron in dialysis; had to give me Benadryl cause I had to have the iron" (05/02/2012)  . Macrodantin [Nitrofurantoin Macrocrystal] Other (See Comments)    "broke me out in big old knots all over my body; had to go to ER"  . Penicillins Other (See Comments)    "makes me real weak when I take it; like I'll pass out"  . Plavix [Clopidogrel Bisulfate] Rash  . Bactrim [Sulfamethoxazole-Trimethoprim] Rash  . Sulfa Antibiotics Rash  . Venofer [Ferric Oxide] Itching    Patient reports using Benadryl prior to doses as [facility]  . Dexilant [Dexlansoprazole] Other (See Comments)     Upset stomach  . Prilosec [Omeprazole] Other (See Comments)    "back spasms"  . Levaquin [Levofloxacin In D5w] Rash  . Protonix [Pantoprazole Sodium] Rash    Current Outpatient Prescriptions  Medication Sig Dispense Refill  . albuterol (PROVENTIL HFA;VENTOLIN HFA) 108 (90 BASE) MCG/ACT inhaler Inhale 2 puffs into the lungs every 6 (six) hours as needed for wheezing or shortness of breath.       . ALPRAZolam (XANAX) 0.5 MG tablet Take 0.5 mg by mouth 3 (three) times daily.      Marland Kitchen amLODipine (NORVASC) 5 MG tablet Take 1 tablet (5 mg total) by mouth daily.  30 tablet  0  . aspirin 81 MG chewable tablet Chew 1 tablet (81 mg total) by mouth daily.      . cloNIDine (CATAPRES) 0.1 MG tablet Take 1 tablet (0.1 mg total) by mouth 2 (two) times daily.  60 tablet  11  . fluticasone (FLONASE) 50 MCG/ACT nasal spray Place 2 sprays into the nose daily.      . folic acid (FOLVITE) 1 MG tablet Take 1 mg by mouth daily.      . folic acid-vitamin b complex-vitamin c-selenium-zinc (DIALYVITE) 3 MG TABS Take 1 tablet by mouth daily.      . isosorbide dinitrate (ISORDIL) 10 MG tablet Take 1 tablet (10 mg total) by mouth 3 (three) times daily.  90 tablet  0  . labetalol (NORMODYNE) 100 MG tablet Take 1.5 tablets (150 mg total) by mouth 2 (two) times daily.  120 tablet  6  . lanthanum (FOSRENOL) 1000 MG chewable tablet Chew 1,000 mg by mouth 3 (three) times daily after meals.      . lidocaine-prilocaine (EMLA) cream Apply 1 application topically every Monday, Wednesday, and Friday.       . loratadine (CLARITIN) 10 MG tablet Take 10 mg by mouth daily.       Marland Kitchen losartan (COZAAR) 100 MG tablet Take 100 mg by mouth daily.      . nitroGLYCERIN (NITROSTAT) 0.4 MG SL tablet Place 0.4 mg under the tongue every 5 (five) minutes as needed for chest pain.      . SENSIPAR 30 MG tablet Take 30 mg by mouth daily.       . simvastatin (ZOCOR) 20 MG tablet Take 1 tablet (20 mg total) by mouth at bedtime.  90 tablet  3  .  zolpidem (AMBIEN) 10 MG tablet Take 10 mg by mouth at bedtime as needed for sleep.        No current facility-administered medications for this visit.    Past Medical History  Diagnosis Date  . ESRD on hemodialysis     ESRD due to HTN, started dialysis 2011 and gets HD at [facility] with Dr Hinda Lenis on MWF schedule.  Access is LUA AVF as of Sept 2014.   Marland Kitchen Chronic bronchitis   . GERD (gastroesophageal reflux disease)   . PUD (peptic ulcer disease)   . History of lower GI bleeding   . Arthritis   . History of gout   . CAD (coronary artery disease)     a. 12/2011 NSTEMI/Cath/PCI LCX (2.25x14 Resolute DES) & D1 (2.25x22 Resolute DES);  b. 01/2012 Cath/PCI: LM 30, LAD 30p, 40-43m, D1 stent ok, 99 in sm branch of diag, LCX patent stent, OM1 20, RCA 95 ost (4.0x12 Promus DES), EF 55%;  c. 04/2012 Lexi Cardiolite  EF 48%, small area of scar @ base/mid inflat wall with mild peri-infarct ischemia.; CABG 12/4  . High cholesterol 12/2011  . Pneumonia ~ 2009  . Iron deficiency anemia   . TIA (transient ischemic attack)   . Anxiety   . History of blood transfusion 07/2011; 12/2011; 01/2012 X 2; 04/2012  . Carotid artery disease     a. [id number] LICA, [id number]   . Mitral regurgitation     a. Moderate by echo, 02/2012  . Myocardial infarction   . Ovarian cancer 1992  . Colon cancer 1992  . Chronic diastolic CHF (congestive heart failure)     a. 02/2012 Echo EF 60-65%, nl wall motion, Gr 1 DD, mod MR  . Hypertension   . AVM (arteriovenous malformation) of colon   . Esophageal stricture     Past Surgical History  Procedure Laterality Date  . Abdominal hysterectomy  1992  . Appendectomy  06/1990  . Tubal ligation  1980's  . Av fistula placement  07/2009    left upper arm  . Thrombectomy / arteriovenous graft revision  2011    left upper arm  . Colon resection  1992  . Esophagogastroduodenoscopy  01/20/2012    Procedure: ESOPHAGOGASTRODUODENOSCOPY (EGD);  Surgeon: Ladene Artist, MD,FACG;  Location:  [facility] ENDOSCOPY;  Service: Endoscopy;  Laterality: N/A;  . Dilation and curettage of uterus    . Coronary angioplasty with stent placement  12/15/11    "2"  . Coronary angioplasty with stent placement  y/2013    "1; makes total of 3" (05/02/2012)  . Coronary artery bypass graft  06/13/2012  Procedure: CORONARY ARTERY BYPASS GRAFTING (CABG);  Surgeon: Grace Isaac, MD;  Location: [place];  Service: Open Heart Surgery;  Laterality: N/A;  cabg x four;  using left internal mammary artery, and left leg greater saphenous vein harvested endoscopically  . Intraoperative transesophageal echocardiogram  06/13/2012    Procedure: INTRAOPERATIVE TRANSESOPHAGEAL ECHOCARDIOGRAM;  Surgeon: Grace Isaac, MD;  Location: [place];  Service: Open Heart Surgery;  Laterality: N/A;  . Colectomy  1992  . Esophagogastroduodenoscopy N/A 03/26/2013    Procedure: ESOPHAGOGASTRODUODENOSCOPY (EGD);  Surgeon: Irene Shipper, MD;  Location: [facility] ENDOSCOPY;  Service: Endoscopy;  Laterality: N/A;  . Stomach surgery      History   Social History  . Marital Status: Married    Spouse Name: N/A    Number of Children: N/A  . Years of Education: N/A   Occupational History  . Not on file.   Social History Main Topics  . Smoking status: Never Smoker   . Smokeless tobacco: Never Used  . Alcohol Use: No  . Drug Use: No  . Sexual Activity: Yes   Other Topics Concern  . Not on file   Social History Narrative   Lives in [place], [place] with husband.  Dialysis pt - [facility].     Filed Vitals:   12/06/13 0940  BP: 163/68  Pulse: 74  Height: 5\' 1"  ([id number] m)  Weight: 148 lb 12.8 oz (67.495 kg)  SpO2: 96%    PHYSICAL EXAM General: NAD Neck: No JVD, no thyromegaly. Lungs: Clear to auscultation bilaterally with normal respiratory effort. CV: Nondisplaced PMI.  Regular rate and rhythm, normal S1/S2, no S3/S4, no murmur. No pretibial or periankle edema.  No carotid bruit.  Normal pedal pulses.  Abdomen: Soft, nontender, no  hepatosplenomegaly, no distention.  Neurologic: Alert and oriented x 3.  Psych: Normal affect. Extremities: No clubbing or cyanosis.   ECG: reviewed and available in electronic records.      ASSESSMENT AND PLAN: 1. Chest pain in the setting of CAD and CABG: Given her hospitalization for chest pain and recurrent episodes of chest pain, I recommend proceeding with coronary angiography. Her chest pain may be exacerbated by malignant hypertension, and I strongly urged her to take her antihypertensive medications as scheduled on a daily basis. For the time being I will not make any adjustments in her medical therapy. 2. Malignant HTN: He has been poorly controlled recently, but she does not take her antihypertensives as scheduled. We had a long discussion about the importance of doing so given her repeated bouts of chest pain associated with malignant hypertension. She is currently prescribed amlodipine 5 mg daily, clonidine 0.1 mg twice daily, isosorbide dinitrate 10 mg three times daily, labetalol 150 mg twice daily, and losartan 100 mg daily. 3. Hyperlipidemia: Continue simvastatin. 4. Mitral regurgitation: Mild to moderate by echo this month. Will monitor.  5. Carotid artery stenosis: continue surveillance monitoring.   Dispo: f/u after cath.   Kate Sable, M.D., F.A.C.C.

## 2013-12-06 NOTE — Patient Instructions (Signed)
Your physician has requested that you have a cardiac catheterization. Cardiac catheterization is used to diagnose and/or treat various heart conditions. Doctors may recommend this procedure for a number of different reasons. The most common reason is to evaluate chest pain. Chest pain can be a symptom of coronary artery disease (CAD), and cardiac catheterization can show whether plaque is narrowing or blocking your heart's arteries. This procedure is also used to evaluate the valves, as well as measure the blood flow and oxygen levels in different parts of your heart. For further information please visit www.cardiosmart.org. Please follow instruction sheet, as given. Continue all current medications. Follow up will be given at time of discharge from procedure above.   

## 2013-12-09 ENCOUNTER — Encounter (HOSPITAL_COMMUNITY): Payer: Self-pay

## 2013-12-10 ENCOUNTER — Other Ambulatory Visit (HOSPITAL_COMMUNITY): Payer: Medicare Other

## 2013-12-10 ENCOUNTER — Encounter (HOSPITAL_COMMUNITY): Payer: Medicare Other

## 2013-12-10 ENCOUNTER — Ambulatory Visit (HOSPITAL_COMMUNITY)
Admission: RE | Admit: 2013-12-10 | Discharge: 2013-12-10 | Disposition: A | Discharge status: Home / Self Care | Payer: Medicare Other | Source: Ambulatory Visit | Attending: Interventional Cardiology | Admitting: Interventional Cardiology

## 2013-12-10 ENCOUNTER — Encounter (HOSPITAL_COMMUNITY)
Admission: RE | Disposition: A | Discharge status: Home / Self Care | Payer: Self-pay | Source: Ambulatory Visit | Attending: Interventional Cardiology

## 2013-12-10 DIAGNOSIS — I5032 Chronic diastolic (congestive) heart failure: Secondary | ICD-10-CM | POA: Insufficient documentation

## 2013-12-10 DIAGNOSIS — I200 Unstable angina: Secondary | ICD-10-CM

## 2013-12-10 DIAGNOSIS — J42 Unspecified chronic bronchitis: Secondary | ICD-10-CM | POA: Insufficient documentation

## 2013-12-10 DIAGNOSIS — Z951 Presence of aortocoronary bypass graft: Secondary | ICD-10-CM

## 2013-12-10 DIAGNOSIS — L299 Pruritus, unspecified: Secondary | ICD-10-CM | POA: Insufficient documentation

## 2013-12-10 DIAGNOSIS — I059 Rheumatic mitral valve disease, unspecified: Secondary | ICD-10-CM | POA: Insufficient documentation

## 2013-12-10 DIAGNOSIS — E785 Hyperlipidemia, unspecified: Secondary | ICD-10-CM | POA: Insufficient documentation

## 2013-12-10 DIAGNOSIS — Z992 Dependence on renal dialysis: Secondary | ICD-10-CM | POA: Insufficient documentation

## 2013-12-10 DIAGNOSIS — N186 End stage renal disease: Secondary | ICD-10-CM | POA: Insufficient documentation

## 2013-12-10 DIAGNOSIS — K219 Gastro-esophageal reflux disease without esophagitis: Secondary | ICD-10-CM | POA: Insufficient documentation

## 2013-12-10 DIAGNOSIS — I6529 Occlusion and stenosis of unspecified carotid artery: Secondary | ICD-10-CM | POA: Insufficient documentation

## 2013-12-10 DIAGNOSIS — T82897A Other specified complication of cardiac prosthetic devices, implants and grafts, initial encounter: Secondary | ICD-10-CM | POA: Insufficient documentation

## 2013-12-10 DIAGNOSIS — I509 Heart failure, unspecified: Secondary | ICD-10-CM | POA: Insufficient documentation

## 2013-12-10 DIAGNOSIS — Z85038 Personal history of other malignant neoplasm of large intestine: Secondary | ICD-10-CM | POA: Insufficient documentation

## 2013-12-10 DIAGNOSIS — Z8543 Personal history of malignant neoplasm of ovary: Secondary | ICD-10-CM | POA: Insufficient documentation

## 2013-12-10 DIAGNOSIS — Z8673 Personal history of transient ischemic attack (TIA), and cerebral infarction without residual deficits: Secondary | ICD-10-CM | POA: Insufficient documentation

## 2013-12-10 DIAGNOSIS — F411 Generalized anxiety disorder: Secondary | ICD-10-CM | POA: Insufficient documentation

## 2013-12-10 DIAGNOSIS — Y831 Surgical operation with implant of artificial internal device as the cause of abnormal reaction of the patient, or of later complication, without mention of misadventure at the time of the procedure: Secondary | ICD-10-CM | POA: Insufficient documentation

## 2013-12-10 DIAGNOSIS — I252 Old myocardial infarction: Secondary | ICD-10-CM | POA: Insufficient documentation

## 2013-12-10 DIAGNOSIS — R079 Chest pain, unspecified: Secondary | ICD-10-CM

## 2013-12-10 DIAGNOSIS — I120 Hypertensive chronic kidney disease with stage 5 chronic kidney disease or end stage renal disease: Secondary | ICD-10-CM | POA: Insufficient documentation

## 2013-12-10 DIAGNOSIS — Z7982 Long term (current) use of aspirin: Secondary | ICD-10-CM | POA: Insufficient documentation

## 2013-12-10 DIAGNOSIS — I2510 Atherosclerotic heart disease of native coronary artery without angina pectoris: Secondary | ICD-10-CM | POA: Insufficient documentation

## 2013-12-10 DIAGNOSIS — I079 Rheumatic tricuspid valve disease, unspecified: Secondary | ICD-10-CM | POA: Insufficient documentation

## 2013-12-10 HISTORY — PX: LEFT HEART CATHETERIZATION WITH CORONARY/GRAFT ANGIOGRAM: SHX5450

## 2013-12-10 LAB — BASIC METABOLIC PANEL
BUN: 40 mg/dL — ABNORMAL HIGH (ref 6–23)
CHLORIDE: 92 meq/L — AB (ref 96–112)
CO2: 22 meq/L (ref 19–32)
Calcium: 9 mg/dL (ref 8.4–10.5)
Creatinine, Ser: 7.76 mg/dL — ABNORMAL HIGH (ref 0.50–1.10)
GFR calc non Af Amer: 5 mL/min — ABNORMAL LOW (ref >90)
GFR, EST AFRICAN AMERICAN: 5 mL/min — AB (ref >90)
Glucose, Bld: 137 mg/dL — ABNORMAL HIGH (ref 70–99)
POTASSIUM: 4.8 meq/L (ref 3.7–5.3)
SODIUM: 134 meq/L — AB (ref 137–147)

## 2013-12-10 LAB — PROTIME-INR
INR: 1.12 (ref 0.00–1.49)
Prothrombin Time: 14.2 seconds (ref 11.6–15.2)

## 2013-12-10 LAB — CBC
HEMATOCRIT: 29.8 % — AB (ref 36.0–46.0)
Hemoglobin: 9.8 g/dL — ABNORMAL LOW (ref 12.0–15.0)
MCH: 32.9 pg (ref 26.0–34.0)
MCHC: 32.9 g/dL (ref 30.0–36.0)
MCV: 100 fL (ref 78.0–100.0)
Platelets: 259 10*3/uL (ref 150–400)
RBC: 2.98 MIL/uL — AB (ref 3.87–5.11)
RDW: 16.1 % — ABNORMAL HIGH (ref 11.5–15.5)
WBC: 8.1 10*3/uL (ref 4.0–10.5)

## 2013-12-10 SURGERY — LEFT HEART CATHETERIZATION WITH CORONARY/GRAFT ANGIOGRAM
Anesthesia: LOCAL

## 2013-12-10 MED ORDER — SODIUM CHLORIDE 0.9 % IJ SOLN: 3.0000 mL | Freq: Two times a day (BID) | INTRAMUSCULAR | Status: DC

## 2013-12-10 MED ORDER — METHYLPREDNISOLONE SODIUM SUCC 125 MG IJ SOLR
INTRAMUSCULAR | Status: AC
  Filled 2013-12-10: qty 2

## 2013-12-10 MED ORDER — SODIUM CHLORIDE 0.9 % IV SOLN: 250.0000 mL | INTRAVENOUS | Status: DC | PRN

## 2013-12-10 MED ORDER — LIDOCAINE HCL (PF) 1 % IJ SOLN
INTRAMUSCULAR | Status: AC
  Filled 2013-12-10: qty 30

## 2013-12-10 MED ORDER — DIPHENHYDRAMINE HCL 50 MG/ML IJ SOLN
INTRAMUSCULAR | Status: AC
  Filled 2013-12-10: qty 1

## 2013-12-10 MED ORDER — MIDAZOLAM HCL 2 MG/2ML IJ SOLN
INTRAMUSCULAR | Status: AC
  Filled 2013-12-10: qty 2

## 2013-12-10 MED ORDER — FENTANYL CITRATE 0.05 MG/ML IJ SOLN
INTRAMUSCULAR | Status: AC
  Filled 2013-12-10: qty 2

## 2013-12-10 MED ORDER — SODIUM CHLORIDE 0.9 % IJ SOLN: 3.0000 mL | INTRAMUSCULAR | Status: DC | PRN

## 2013-12-10 MED ORDER — METHYLPREDNISOLONE SODIUM SUCC 125 MG IJ SOLR
60.0000 mg | Freq: Once | INTRAMUSCULAR | Status: AC
  Administered 2013-12-10: 60 mg via INTRAVENOUS

## 2013-12-10 MED ORDER — HYDRALAZINE HCL 20 MG/ML IJ SOLN
INTRAMUSCULAR | Status: AC
  Filled 2013-12-10: qty 1

## 2013-12-10 MED ORDER — HEPARIN (PORCINE) IN NACL 2-0.9 UNIT/ML-% IJ SOLN
INTRAMUSCULAR | Status: AC
  Filled 2013-12-10: qty 1000

## 2013-12-10 MED ORDER — NITROGLYCERIN 0.2 MG/ML ON CALL CATH LAB
INTRAVENOUS | Status: AC
  Filled 2013-12-10: qty 1

## 2013-12-10 NOTE — Discharge Instructions (Signed)
Angiography, Care After  Refer to this sheet in the next few weeks. These instructions provide you with information on caring for yourself after your procedure. Your health care provider may also give you more specific instructions. Your treatment has been planned according to current medical practices, but problems sometimes occur. Call your health care provider if you have any problems or questions after your procedure.  WHAT TO EXPECT AFTER THE PROCEDURE After your procedure, it is typical to have the following sensations:  Minor discomfort or tenderness and a small bump at the catheter insertion site. The bump should usually decrease in size and tenderness within 1 to 2 weeks.  Any bruising will usually fade within 2 to 4 weeks. HOME CARE INSTRUCTIONS   You may need to keep taking blood thinners if they were prescribed for you. Only take over-the-counter or prescription medicines for pain, fever, or discomfort as directed by your health care provider.  Do not apply powder or lotion to the site.  Do not sit in a bathtub, swimming pool, or whirlpool for 5 to 7 days.  You may shower 24 hours after the procedure. Remove the bandage (dressing) and gently wash the site with plain soap and water. Gently pat the site dry.  Inspect the site at least twice daily.  Limit your activity for the first 48 hours. Do not bend, squat, or lift anything over 10 lb (9 kg) or as directed by your health care provider.  Do not drive home if you are discharged the day of the procedure. Have someone else drive you. Follow instructions about when you can drive or return to work. SEEK MEDICAL CARE IF:  You get lightheaded when standing up.  You have drainage (other than a small amount of blood on the dressing).  You have chills.  You have a fever.  You have redness, warmth, swelling, or pain at the insertion site. SEEK IMMEDIATE MEDICAL CARE IF:   You develop chest pain or shortness of breath, feel  faint, or pass out.  You have bleeding, swelling larger than a walnut, or drainage from the catheter insertion site.  You develop pain, discoloration, coldness, or severe bruising in the leg or arm that held the catheter.  You have heavy bleeding from the site. If this happens, hold pressure on the site. MAKE SURE YOU:  Understand these instructions.  Will watch your condition.  Will get help right away if you are not doing well or get worse. Document Released: 01/13/2005 Document Revised: 02/27/2013 Document Reviewed: 11/19/2012 [facility] Patient Information 2014 [place].

## 2013-12-10 NOTE — CV Procedure (Signed)
    PROCEDURE:  Left heart catheterization with selective coronary angiography, bypass graft angiography.  INDICATIONS:  Chest discomfort  The risks, benefits, and details of the procedure were explained to the patient.  The patient verbalized understanding and wanted to proceed.  Informed written consent was obtained.  PROCEDURE TECHNIQUE:  After Xylocaine anesthesia a 15F sheath was placed in the right femoral artery with a single anterior needle wall stick.   Left coronary angiography was done using a Judkins L4 guide catheter.  Right coronary angiography was done using a Judkins R4 guide catheter.  The 3 vein grafts were increased with the JR 4 catheter. An IMA catheter was used to engage the LIMA.  Left ventriculography was not done.  The JR 4 catheter was used to perform the left heart catheterization after crossing the aortic valve with a wire.    CONTRAST:  Total of 70 cc.  COMPLICATIONS:  None.    HEMODYNAMICS:  Aortic pressure was 136/62; LV pressure was 139/8; LVEDP 20.  There was no gradient between the left ventricle and aorta.    ANGIOGRAPHIC DATA:   The left main coronary artery is diseased distally.  The left anterior descending artery is a large vessel. There is mild disease proximally. In the mid vessel, there is mild diffuse atherosclerosis. In the mid to distal vessel, there is an 80% stenosis. The LIMA appears to insert just after the stenosis. There is competitive flow in the distal LAD. There is a large diagonal with widely patent stents.  The LIMA to LAD is widely patent.  The left circumflex artery is a small vessel. There is severe in-stent restenosis of the proximal stent. The remainder of the vessel is patent there is a large ramus vessel which has a 70% ostial stenosis. The SVG to ramus is widely patent. The SVG to circumflex/OM is widely patent.  The right coronary artery is occluded at the ostium, at the previously placed stent. The SVG to RCA is widely patent.  The posterior descending artery is widely patent. The posterior descending artery is widely patent. There is backflow of contrast all the way up to the distal edge of the proximal RCA stent.  LEFT VENTRICULOGRAM:  Left ventricular angiogram was not done. LVEDP was 20 mmHg.  IMPRESSIONS:  1. Patent left main coronary artery. 2. Focal significant disease in the mid to distal left anterior descending artery.  Patent diagonal branch.  LIMA to LAD is widely patent. 3. Severe restenosis of the proximal left circumflex artery stent. Patent SVG to circumflex.  70% proximal ramus stenosis. Widely patent SVG to ramus. 4. Occluded stent at the ostium of the right coronary artery. Widely patent SVG to distal RCA. 5. Left ventricular systolic function not assessed.  LVEDP 20 mmHg.    RECOMMENDATION:  Continue medical therapy including aggressive blood pressure control.  I stressed the importance of compliance with her medications. Of note, despite being premedicated for contrast allergy, after the procedure, she had significant itching in her feet. We have given her IV Benadryl to help with this.  F/u with Dr. Bronson Ing.Marland Kitchen

## 2013-12-10 NOTE — H&P (View-Only) (Signed)
Patient ID: Shawna [name], female   DOB: 10-Jan-1941, 73 y.o.   MRN: [id number]      SUBJECTIVE: Shawna [name] is here for post hospitalization followup. She had chest pain and malignant hypertension. At that time I had recommended an outpatient nuclear stress test. An echocardiogram during her hospitalization demonstrated normal left ventricular systolic function, EF 0000000, moderate LVH, grade 1 diastolic dysfunction, elevated filling pressures, mild to moderate posteriorly directed mitral regurgitation, and mild tricuspid regurgitation.  She does not take all of her blood pressure medications on dialysis days, as she is had episodes of low blood pressure on these days in the past. However, most recently she has been having systolic blood pressure readings of 199 and 211 on dialysis days. When it gets this high, she experienced chest pain. She awoke with chest pain last night and took an aspirin and it was relieved within 30 minutes. She's had some abdominal fullness and reportedly underwent an MRI. Her blood pressure cuff has also been giving discordant readings.    Allergies  Allergen Reactions  . Aspirin Other (See Comments)    Mess up her stomach; "makes my bowels have blood in them". Takes 81 mg EC Aspirin   . Contrast Media [Iodinated Diagnostic Agents] Itching  . Iron Itching and Other (See Comments)    "they gave me iron in dialysis; had to give me Benadryl cause I had to have the iron" (05/02/2012)  . Macrodantin [Nitrofurantoin Macrocrystal] Other (See Comments)    "broke me out in big old knots all over my body; had to go to ER"  . Penicillins Other (See Comments)    "makes me real weak when I take it; like I'll pass out"  . Plavix [Clopidogrel Bisulfate] Rash  . Bactrim [Sulfamethoxazole-Trimethoprim] Rash  . Sulfa Antibiotics Rash  . Venofer [Ferric Oxide] Itching    Patient reports using Benadryl prior to doses as [facility]  . Dexilant [Dexlansoprazole] Other (See Comments)     Upset stomach  . Prilosec [Omeprazole] Other (See Comments)    "back spasms"  . Levaquin [Levofloxacin In D5w] Rash  . Protonix [Pantoprazole Sodium] Rash    Current Outpatient Prescriptions  Medication Sig Dispense Refill  . albuterol (PROVENTIL HFA;VENTOLIN HFA) 108 (90 BASE) MCG/ACT inhaler Inhale 2 puffs into the lungs every 6 (six) hours as needed for wheezing or shortness of breath.       . ALPRAZolam (XANAX) 0.5 MG tablet Take 0.5 mg by mouth 3 (three) times daily.      Marland Kitchen amLODipine (NORVASC) 5 MG tablet Take 1 tablet (5 mg total) by mouth daily.  30 tablet  0  . aspirin 81 MG chewable tablet Chew 1 tablet (81 mg total) by mouth daily.      . cloNIDine (CATAPRES) 0.1 MG tablet Take 1 tablet (0.1 mg total) by mouth 2 (two) times daily.  60 tablet  11  . fluticasone (FLONASE) 50 MCG/ACT nasal spray Place 2 sprays into the nose daily.      . folic acid (FOLVITE) 1 MG tablet Take 1 mg by mouth daily.      . folic acid-vitamin b complex-vitamin c-selenium-zinc (DIALYVITE) 3 MG TABS Take 1 tablet by mouth daily.      . isosorbide dinitrate (ISORDIL) 10 MG tablet Take 1 tablet (10 mg total) by mouth 3 (three) times daily.  90 tablet  0  . labetalol (NORMODYNE) 100 MG tablet Take 1.5 tablets (150 mg total) by mouth 2 (two) times daily.  120 tablet  6  . lanthanum (FOSRENOL) 1000 MG chewable tablet Chew 1,000 mg by mouth 3 (three) times daily after meals.      . lidocaine-prilocaine (EMLA) cream Apply 1 application topically every Monday, Wednesday, and Friday.       . loratadine (CLARITIN) 10 MG tablet Take 10 mg by mouth daily.       Marland Kitchen losartan (COZAAR) 100 MG tablet Take 100 mg by mouth daily.      . nitroGLYCERIN (NITROSTAT) 0.4 MG SL tablet Place 0.4 mg under the tongue every 5 (five) minutes as needed for chest pain.      . SENSIPAR 30 MG tablet Take 30 mg by mouth daily.       . simvastatin (ZOCOR) 20 MG tablet Take 1 tablet (20 mg total) by mouth at bedtime.  90 tablet  3  .  zolpidem (AMBIEN) 10 MG tablet Take 10 mg by mouth at bedtime as needed for sleep.        No current facility-administered medications for this visit.    Past Medical History  Diagnosis Date  . ESRD on hemodialysis     ESRD due to HTN, started dialysis 2011 and gets HD at [facility] with Dr Hinda Lenis on MWF schedule.  Access is LUA AVF as of Sept 2014.   Marland Kitchen Chronic bronchitis   . GERD (gastroesophageal reflux disease)   . PUD (peptic ulcer disease)   . History of lower GI bleeding   . Arthritis   . History of gout   . CAD (coronary artery disease)     a. 12/2011 NSTEMI/Cath/PCI LCX (2.25x14 Resolute DES) & D1 (2.25x22 Resolute DES);  b. 01/2012 Cath/PCI: LM 30, LAD 30p, 40-75m, D1 stent ok, 99 in sm branch of diag, LCX patent stent, OM1 20, RCA 95 ost (4.0x12 Promus DES), EF 55%;  c. 04/2012 Lexi Cardiolite  EF 48%, small area of scar @ base/mid inflat wall with mild peri-infarct ischemia.; CABG 12/4  . High cholesterol 12/2011  . Pneumonia ~ 2009  . Iron deficiency anemia   . TIA (transient ischemic attack)   . Anxiety   . History of blood transfusion 07/2011; 12/2011; 01/2012 X 2; 04/2012  . Carotid artery disease     a. [id number] LICA, Q000111Q   . Mitral regurgitation     a. Moderate by echo, 02/2012  . Myocardial infarction   . Ovarian cancer 1992  . Colon cancer 1992  . Chronic diastolic CHF (congestive heart failure)     a. 02/2012 Echo EF 60-65%, nl wall motion, Gr 1 DD, mod MR  . Hypertension   . AVM (arteriovenous malformation) of colon   . Esophageal stricture     Past Surgical History  Procedure Laterality Date  . Abdominal hysterectomy  1992  . Appendectomy  06/1990  . Tubal ligation  1980's  . Av fistula placement  07/2009    left upper arm  . Thrombectomy / arteriovenous graft revision  2011    left upper arm  . Colon resection  1992  . Esophagogastroduodenoscopy  01/20/2012    Procedure: ESOPHAGOGASTRODUODENOSCOPY (EGD);  Surgeon: Ladene Artist, MD,FACG;  Location:  [facility] ENDOSCOPY;  Service: Endoscopy;  Laterality: N/A;  . Dilation and curettage of uterus    . Coronary angioplasty with stent placement  12/15/11    "2"  . Coronary angioplasty with stent placement  y/2013    "1; makes total of 3" (05/02/2012)  . Coronary artery bypass graft  06/13/2012  Procedure: CORONARY ARTERY BYPASS GRAFTING (CABG);  Surgeon: Grace Isaac, MD;  Location: [place];  Service: Open Heart Surgery;  Laterality: N/A;  cabg x four;  using left internal mammary artery, and left leg greater saphenous vein harvested endoscopically  . Intraoperative transesophageal echocardiogram  06/13/2012    Procedure: INTRAOPERATIVE TRANSESOPHAGEAL ECHOCARDIOGRAM;  Surgeon: Grace Isaac, MD;  Location: [place];  Service: Open Heart Surgery;  Laterality: N/A;  . Colectomy  1992  . Esophagogastroduodenoscopy N/A 03/26/2013    Procedure: ESOPHAGOGASTRODUODENOSCOPY (EGD);  Surgeon: Irene Shipper, MD;  Location: [facility] ENDOSCOPY;  Service: Endoscopy;  Laterality: N/A;  . Stomach surgery      History   Social History  . Marital Status: Married    Spouse Name: N/A    Number of Children: N/A  . Years of Education: N/A   Occupational History  . Not on file.   Social History Main Topics  . Smoking status: Never Smoker   . Smokeless tobacco: Never Used  . Alcohol Use: No  . Drug Use: No  . Sexual Activity: Yes   Other Topics Concern  . Not on file   Social History Narrative   Lives in [place], [place] with husband.  Dialysis pt - [facility].     Filed Vitals:   12/06/13 0940  BP: 163/68  Pulse: 74  Height: 5\' 1"  (1.549 m)  Weight: 148 lb 12.8 oz (67.495 kg)  SpO2: 96%    PHYSICAL EXAM General: NAD Neck: No JVD, no thyromegaly. Lungs: Clear to auscultation bilaterally with normal respiratory effort. CV: Nondisplaced PMI.  Regular rate and rhythm, normal S1/S2, no S3/S4, no murmur. No pretibial or periankle edema.  No carotid bruit.  Normal pedal pulses.  Abdomen: Soft, nontender, no  hepatosplenomegaly, no distention.  Neurologic: Alert and oriented x 3.  Psych: Normal affect. Extremities: No clubbing or cyanosis.   ECG: reviewed and available in electronic records.      ASSESSMENT AND PLAN: 1. Chest pain in the setting of CAD and CABG: Given her hospitalization for chest pain and recurrent episodes of chest pain, I recommend proceeding with coronary angiography. Her chest pain may be exacerbated by malignant hypertension, and I strongly urged her to take her antihypertensive medications as scheduled on a daily basis. For the time being I will not make any adjustments in her medical therapy. 2. Malignant HTN: He has been poorly controlled recently, but she does not take her antihypertensives as scheduled. We had a long discussion about the importance of doing so given her repeated bouts of chest pain associated with malignant hypertension. She is currently prescribed amlodipine 5 mg daily, clonidine 0.1 mg twice daily, isosorbide dinitrate 10 mg three times daily, labetalol 150 mg twice daily, and losartan 100 mg daily. 3. Hyperlipidemia: Continue simvastatin. 4. Mitral regurgitation: Mild to moderate by echo this month. Will monitor.  5. Carotid artery stenosis: continue surveillance monitoring.   Dispo: f/u after cath.   Kate Sable, M.D., F.A.C.C.

## 2013-12-10 NOTE — Progress Notes (Signed)
LUKE KILROY,PA NOTIFIED OF B/P AND CLIENT STATES WILL TAKE B/P PILL WHEN GETS TO CAR AND OK TO D/C PER LUKE,PA

## 2013-12-10 NOTE — Interval H&P Note (Signed)
Cath Lab Visit (complete for each Cath Lab visit)  Clinical Evaluation Leading to the Procedure:   ACS: no  Non-ACS:    Anginal Classification: CCS III  Anti-ischemic medical therapy: Minimal Therapy (1 class of medications)  Non-Invasive Test Results: No non-invasive testing performed  Prior CABG: Previous CABG      History and Physical Interval Note:  12/10/2013 1:16 PM  Shawna [name]  has presented today for surgery, with the diagnosis of c/p  The various methods of treatment have been discussed with the patient and family. After consideration of risks, benefits and other options for treatment, the patient has consented to  Procedure(s): LEFT HEART CATHETERIZATION WITH CORONARY/GRAFT ANGIOGRAM (N/A) as a surgical intervention .  The patient's history has been reviewed, patient examined, no change in status, stable for surgery.  I have reviewed the patient's chart and labs.  Questions were answered to the patient's satisfaction.     Shawna [name]

## 2013-12-11 ENCOUNTER — Ambulatory Visit: Payer: Medicare Other | Admitting: Physician Assistant

## 2013-12-12 ENCOUNTER — Other Ambulatory Visit: Payer: Self-pay | Admitting: Cardiovascular Disease

## 2013-12-17 ENCOUNTER — Ambulatory Visit (INDEPENDENT_AMBULATORY_CARE_PROVIDER_SITE_OTHER): Payer: Medicare Other | Admitting: Adult Health

## 2013-12-17 ENCOUNTER — Encounter: Payer: Self-pay | Admitting: Adult Health

## 2013-12-17 VITALS — BP 190/90 | HR 80 | Ht 61.0 in | Wt 147.0 lb

## 2013-12-17 DIAGNOSIS — Z992 Dependence on renal dialysis: Secondary | ICD-10-CM

## 2013-12-17 DIAGNOSIS — Z951 Presence of aortocoronary bypass graft: Secondary | ICD-10-CM

## 2013-12-17 DIAGNOSIS — I10 Essential (primary) hypertension: Secondary | ICD-10-CM

## 2013-12-17 DIAGNOSIS — N186 End stage renal disease: Secondary | ICD-10-CM

## 2013-12-17 MED ORDER — AMLODIPINE BESYLATE 10 MG PO TABS: 10.0000 mg | ORAL_TABLET | Freq: Every day | ORAL | Status: DC

## 2013-12-17 NOTE — Assessment & Plan Note (Signed)
The patient has dialysis on Monday Wednesdays Fridays. She states that her blood pressures been rising during dialysis consider decreasing as it normally does. She also states that when her blood pressure drops to below [id number] systolic she becomes very lightheaded and dizzy and is unable tolerate a lower blood pressure. Close followup of her blood pressure after increase in amlodipine is planned

## 2013-12-17 NOTE — Progress Notes (Deleted)
Name: Shawna [name]    DOB: December 23, 1940  Age: 73 y.o.  MR#: [id number]       PCP:  [name], MD      Insurance: Payor: [employer] MEDICARE / Plan: AARP MEDICARE COMPLETE / Product Type: *No Product type* /   CC:    Chief Complaint  Patient presents with  . Hypertension    VS Filed Vitals:   12/17/13 1530  BP: 190/90  Pulse: 80  Height: 5\' 1"  (1.549 m)  Weight: 147 lb (66.679 kg)    Weights Current Weight  12/17/13 147 lb (66.679 kg)  12/10/13 144 lb (65.318 kg)  12/10/13 144 lb (65.318 kg)    Blood Pressure  BP Readings from Last 3 Encounters:  12/17/13 190/90  12/10/13 198/77  12/10/13 198/77     Admit date:  (Not on file) Last encounter with RMR:  08/14/2013   Allergy Aspirin; Contrast media; Iron; Macrodantin; Penicillins; Plavix; Bactrim; Sulfa antibiotics; Venofer; Dexilant; Prilosec; Levaquin; and Protonix  Current Outpatient Prescriptions  Medication Sig Dispense Refill  . albuterol (PROVENTIL HFA;VENTOLIN HFA) 108 (90 BASE) MCG/ACT inhaler Inhale 2 puffs into the lungs every 6 (six) hours as needed for wheezing or shortness of breath.       . ALPRAZolam (XANAX) 0.5 MG tablet Take 0.5 mg by mouth 3 (three) times daily.      Marland Kitchen amLODipine (NORVASC) 5 MG tablet Take 5 mg by mouth daily.       Marland Kitchen aspirin EC 81 MG tablet Take 81 mg by mouth daily.      . cloNIDine (CATAPRES) 0.1 MG tablet Take 1 tablet (0.1 mg total) by mouth 2 (two) times daily.  60 tablet  11  . diphenhydrAMINE (SOMINEX) 25 MG tablet Take 25 mg by mouth See admin instructions. Take 1 tab (25mg ) by mouth at 6:00 pm the evening prior to cath, midnight, & 6:00 am the morning of the cath      . fluticasone (FLONASE) 50 MCG/ACT nasal spray Place 2 sprays into the nose daily as needed for allergies.       . folic acid-vitamin b complex-vitamin c-selenium-zinc (DIALYVITE) 3 MG TABS Take 1 tablet by mouth daily.      . isosorbide dinitrate (ISORDIL) 10 MG tablet Take 1 tablet (10 mg total) by mouth 3  (three) times daily.  90 tablet  0  . labetalol (NORMODYNE) 100 MG tablet Take 1.5 tablets (150 mg total) by mouth 2 (two) times daily.  120 tablet  6  . lanthanum (FOSRENOL) 1000 MG chewable tablet Chew 1,000 mg by mouth 3 (three) times daily after meals.      Marland Kitchen loratadine (CLARITIN) 10 MG tablet Take 10 mg by mouth daily.       Marland Kitchen losartan (COZAAR) 100 MG tablet Take 100 mg by mouth daily.      . nitroGLYCERIN (NITROSTAT) 0.4 MG SL tablet Place 0.4 mg under the tongue every 5 (five) minutes as needed for chest pain.      Marland Kitchen omeprazole (PRILOSEC) 20 MG capsule 20 mg daily.       . predniSONE (DELTASONE) 20 MG tablet Take 60 mg by mouth See admin instructions. Take 3 tabs (60mg ) by mouth at 6:00 pm the evening prior to cath, midnight, & 6:00 am the morning of the cath      . PROCTOSOL HC 2.5 % rectal cream       . SENSIPAR 30 MG tablet Take 30 mg by mouth at bedtime.       Marland Kitchen  simvastatin (ZOCOR) 20 MG tablet Take 1 tablet (20 mg total) by mouth at bedtime.  90 tablet  3  . tetrahydrozoline 0.05 % ophthalmic solution Place 1 drop into both eyes 2 (two) times daily as needed (irritation).      Marland Kitchen zolpidem (AMBIEN) 10 MG tablet Take 10 mg by mouth at bedtime as needed for sleep.        No current facility-administered medications for this visit.    Discontinued Meds:    Medications Discontinued During This Encounter  Medication Reason  . labetalol (NORMODYNE) 200 MG tablet Error  . ranitidine (ZANTAC) 150 MG tablet Error    Patient Active Problem List   Diagnosis Date Noted  . Chest pain 11/26/2013  . Small bowel obstruction, partial 05/29/2013  . Chronic systolic CHF (congestive heart failure) 03/22/2013  . GI bleeding 03/21/2013  . Acute blood loss anemia 03/21/2013  . Vaginal odor 03/12/2013  . Vaginal discharge 03/12/2013  . Abdominal  pain, other specified site 03/12/2013  . Occlusion and stenosis of carotid artery without mention of cerebral infarction 01/24/2013  . Hx of CABG  07/05/2012  . Carotid artery disease 07/05/2012  . Anemia of chronic kidney failure 07/03/2012  . Secondary hyperparathyroidism 07/03/2012  . Mitral regurgitation 06/12/2012  . Pneumonia 06/09/2012  . Non-STEMI (non-ST elevated myocardial infarction) 06/08/2012  . GERD (gastroesophageal reflux disease) 01/09/2012  . HLD (hyperlipidemia) 01/05/2012  . Coronary atherosclerosis of native coronary artery 12/16/2011  . Essential hypertension, benign 12/16/2011  . ESRD on hemodialysis 12/16/2011    LABS    Component Value Date/Time   NA 134* 12/10/2013 1125   NA 144 11/26/2013 1158   NA 138 10/15/2013 0713   K 4.8 12/10/2013 1125   K 4.9 11/26/2013 1158   K 4.2 10/15/2013 0713   CL 92* 12/10/2013 1125   CL 97 11/26/2013 1158   CL 99 10/15/2013 0713   CO2 22 12/10/2013 1125   CO2 32 11/26/2013 1158   CO2 29 06/01/2013 0348   GLUCOSE 137* 12/10/2013 1125   GLUCOSE 89 11/26/2013 1158   GLUCOSE 176* 10/15/2013 0713   BUN 40* 12/10/2013 1125   BUN 43* 11/26/2013 1158   BUN 33* 10/15/2013 0713   CREATININE 7.76* 12/10/2013 1125   CREATININE 8.74* 11/26/2013 1158   CREATININE 8.30* 10/15/2013 0713   CALCIUM 9.0 12/10/2013 1125   CALCIUM 8.3* 11/26/2013 1158   CALCIUM 9.8 06/01/2013 0348   CALCIUM 8.1* 05/29/2013 1814   GFRNONAA 5* 12/10/2013 1125   GFRNONAA 4* 11/26/2013 1158   GFRNONAA 6* 06/01/2013 0348   GFRAA 5* 12/10/2013 1125   GFRAA 5* 11/26/2013 1158   GFRAA 7* 06/01/2013 0348   CMP     Component Value Date/Time   NA 134* 12/10/2013 1125   K 4.8 12/10/2013 1125   CL 92* 12/10/2013 1125   CO2 22 12/10/2013 1125   GLUCOSE 137* 12/10/2013 1125   BUN 40* 12/10/2013 1125   CREATININE 7.76* 12/10/2013 1125   CALCIUM 9.0 12/10/2013 1125   CALCIUM 8.1* 05/29/2013 1814   PROT 6.0 05/29/2013 1814   ALBUMIN 4.0 06/01/2013 0348   AST 18 05/29/2013 1814   ALT 9 05/29/2013 1814   ALKPHOS 98 05/29/2013 1814   BILITOT 0.4 05/29/2013 1814   GFRNONAA 5* 12/10/2013 1125   GFRAA 5* 12/10/2013 1125       Component Value Date/Time    WBC 8.1 12/10/2013 1125   WBC 5.6 11/26/2013 1158   WBC 7.9 06/01/2013 0348  HGB 9.8* 12/10/2013 1125   HGB 11.2* 11/26/2013 1158   HGB 11.9* 10/15/2013 0713   HCT 29.8* 12/10/2013 1125   HCT 35.4* 11/26/2013 1158   HCT 35.0* 10/15/2013 0713   MCV 100.0 12/10/2013 1125   MCV 103.2* 11/26/2013 1158   MCV 99.5 06/01/2013 0348    Lipid Panel     Component Value Date/Time   CHOL 129 07/07/2012 0815   TRIG 97 07/07/2012 0815   HDL 51 07/07/2012 0815   CHOLHDL 2.5 07/07/2012 0815   VLDL 19 07/07/2012 0815   LDLCALC 59 07/07/2012 0815    ABG    Component Value Date/Time   PHART 7.302* 06/14/2012 0510   PCO2ART 44.9 06/14/2012 0510   PO2ART 120.0* 06/14/2012 0510   HCO3 22.2 06/14/2012 0510   TCO2 26 10/15/2013 0713   ACIDBASEDEF 4.0* 06/14/2012 0510   O2SAT 98.0 06/14/2012 0510     Lab Results  Component Value Date   TSH 1.758 09/05/2012   BNP (last 3 results) No results found for this basename: PROBNP,  in the last 8760 hours Cardiac Panel (last 3 results) No results found for this basename: CKTOTAL, CKMB, TROPONINI, RELINDX,  in the last 72 hours  Iron/TIBC/Ferritin    Component Value Date/Time   IRON 14* 05/29/2013 1814   TIBC 194* 05/29/2013 1814   FERRITIN 243 06/11/2012 0728     EKG Orders placed during the hospital encounter of 11/26/13  . ED EKG  . ED EKG  . EKG     Prior Assessment and Plan Problem List as of 12/17/2013     Cardiovascular and Mediastinum   Coronary atherosclerosis of native coronary artery   Last Assessment & Plan   08/14/2013 Office Visit Written 08/14/2013  4:05 PM by Lendon Colonel, NP     Doubt symptoms are related to ACS or cardiac etiology at this time. She states that her cardiac pain is more of a pressure with radiation to her left arm with profound weakness. This is more of a sticking pain. It is associated with muscled cramps in her hands during dialysis. Will not plan and cardiac testing. Reassurance is given. She has had labs drawn by  nephrology today during dialysis. She is to have results discussed on Friday during next treatment. Will see her again in one month unless symptoms progress or worsen.    Essential hypertension, benign   Last Assessment & Plan   08/14/2013 Office Visit Written 08/14/2013  4:06 PM by Lendon Colonel, NP     Blood pressure is well controlled currently.  Will continue current medication regimen. She will follow up with Dr. Bronson Ing in one month.     Non-STEMI (non-ST elevated myocardial infarction)   Mitral regurgitation   Carotid artery disease   Occlusion and stenosis of carotid artery without mention of cerebral infarction   Chronic systolic CHF (congestive heart failure)   Last Assessment & Plan   08/14/2013 Office Visit Written 08/14/2013  4:07 PM by Lendon Colonel, NP     She does not appear to be fluid overloaded at present. No changes to medication regimen.      Respiratory   Pneumonia     Digestive   GERD (gastroesophageal reflux disease)   GI bleeding   Small bowel obstruction, partial     Endocrine   Secondary hyperparathyroidism     Genitourinary   ESRD on hemodialysis   Last Assessment & Plan   05/29/2012 Office Visit Written 05/29/2012 10:06 AM by  Donney Dice, PA     Followed by Dr. Lowanda Foster      Other   Anemia of chronic kidney failure   HLD (hyperlipidemia)   Last Assessment & Plan   09/20/2012 Office Visit Written 09/20/2012  1:38 PM by Donney Dice, PA-C     Will reassess lipid status with a FLP. Recommend target LDL 70 or less, if feasible. Continue current low-dose Lipitor, pending review of results.    Hx of CABG   Last Assessment & Plan   09/20/2012 Office Visit Written 09/20/2012  1:35 PM by Donney Dice, PA-C     Quiescent on current medication regimen.    Vaginal odor   Vaginal discharge   Abdominal  pain, other specified site   Acute blood loss anemia   Chest pain       Imaging: Dg Chest Portable 1 View  11/26/2013   CLINICAL DATA:   Left-sided chest pain  EXAM: PORTABLE CHEST - 1 VIEW  COMPARISON:  10/03/2013  FINDINGS: There are mild chronic bronchitic changes. There is mild interstitial prominence which is accentuated by low lung volumes. There is no focal parenchymal opacity, pleural effusion, or pneumothorax. The heart size is top-normal. There is evidence of prior CABG. The osseous structures are unremarkable.  IMPRESSION: No active disease.   Electronically Signed   By: Kathreen Devoid   On: 11/26/2013 11:44

## 2013-12-17 NOTE — Patient Instructions (Signed)
Your physician recommends that you schedule a follow-up appointment in: 1 month  Come for a blood pressure check on Monday. Bring your machine with you.  Your physician has recommended you make the following change in your medication:   Increased Amlodipine to 10 mg daily.

## 2013-12-17 NOTE — Assessment & Plan Note (Signed)
The patient denies medical noncompliance. She states she takes her blood pressure at home and is normally lower in the [id number] systolic. Cardiac catheterization did not reveal new areas of occlusion or disease. She was continued on her current medication with reinforcement and need to be compliant. The patient is adamant that she takes her medicines as directed.  I would increase her amlodipine from 5 mg daily to 10 mg daily. She will come back in a week for blood pressure check and also bring her blood pressure monitor with her to compare readings. May need to increase clonidine should this to necessary. Right OA 2 lobe a blood pressure as this will not be tolerated during dialysis.

## 2013-12-17 NOTE — Progress Notes (Signed)
HPI: Shawna [name] is a 73 year old patient of Dr. Pennelope Bracken we are following for ongoing assessment and management of CAD status post coronary artery bypass grafting, chronic diastolic CHF, and hypertension with history of end-stage renal disease, and carotid artery disease. She was last seen in the office on 12/06/2013, and recommended for cardiac catheterization in the setting of exacerbated malignant hypertension and recurrent chest pain. She was reinforced on medical compliance and she was not take her medications as directed. She was to take amlodipine 5 mg daily clonidine 0.1 mg twice a day isosorbide, and labetalol 150 twice a day along with losartan 100 mg daily.  Cardiac catheterization was completed on 12/10/2013 by Dr.Varanosi, revealing a patent left main coronary artery focal significant disease in the mid and distal left anterior descending artery patent diagonal branch LIMA to LAD widely patent severe restenosis of the proximal left circumflex artery stent patent SVG to circumflex 70% proximal ramus stenosis widely patent SVG to ramus. Occluded stent at the ostium of the right coronary artery, widely patent SVG to distal RCA. LVEF was not assessed LVEDP was 20 mm mercury.  She was continued on medical therapy to include aggressive blood pressure control and stressing the importance of compliance with her medications. She was treated with Benadryl to help with contrast allergy and she had significant itching in her feet.  She comes today hypertensive. She is frustrated by her blood pressure being high. She states she is taking her medication as directed, avoiding salt. She also is noticing during dialysis that her blood pressure rises as it goes down. He denies chest pain dizziness headache or blurred vision associated with this.   Allergies  Allergen Reactions  . Aspirin Other (See Comments)    Mess up her stomach; "makes my bowels have blood in them". Takes 81 mg EC Aspirin   .  Contrast Media [Iodinated Diagnostic Agents] Itching  . Iron Itching and Other (See Comments)    "they gave me iron in dialysis; had to give me Benadryl cause I had to have the iron" (05/02/2012)  . Macrodantin [Nitrofurantoin Macrocrystal] Other (See Comments)    "broke me out in big old knots all over my body; had to go to ER"  . Penicillins Other (See Comments)    "makes me real weak when I take it; like I'll pass out"  . Plavix [Clopidogrel Bisulfate] Rash  . Bactrim [Sulfamethoxazole-Trimethoprim] Rash  . Sulfa Antibiotics Rash  . Venofer [Ferric Oxide] Itching    Patient reports using Benadryl prior to doses as [facility]  . Dexilant [Dexlansoprazole] Other (See Comments)    Upset stomach  . Prilosec [Omeprazole] Other (See Comments)    "back spasms"  . Levaquin [Levofloxacin In D5w] Rash  . Protonix [Pantoprazole Sodium] Rash    Current Outpatient Prescriptions  Medication Sig Dispense Refill  . albuterol (PROVENTIL HFA;VENTOLIN HFA) 108 (90 BASE) MCG/ACT inhaler Inhale 2 puffs into the lungs every 6 (six) hours as needed for wheezing or shortness of breath.       . ALPRAZolam (XANAX) 0.5 MG tablet Take 0.5 mg by mouth 3 (three) times daily.      Marland Kitchen amLODipine (NORVASC) 10 MG tablet Take 1 tablet (10 mg total) by mouth daily.  30 tablet  6  . aspirin EC 81 MG tablet Take 81 mg by mouth daily.      . cloNIDine (CATAPRES) 0.1 MG tablet Take 1 tablet (0.1 mg total) by mouth 2 (two) times daily.  60 tablet  11  . diphenhydrAMINE (SOMINEX) 25 MG tablet Take 25 mg by mouth See admin instructions. Take 1 tab (25mg ) by mouth at 6:00 pm the evening prior to cath, midnight, & 6:00 am the morning of the cath      . fluticasone (FLONASE) 50 MCG/ACT nasal spray Place 2 sprays into the nose daily as needed for allergies.       . folic acid-vitamin b complex-vitamin c-selenium-zinc (DIALYVITE) 3 MG TABS Take 1 tablet by mouth daily.      . isosorbide dinitrate (ISORDIL) 10 MG tablet Take 1  tablet (10 mg total) by mouth 3 (three) times daily.  90 tablet  0  . labetalol (NORMODYNE) 100 MG tablet Take 1.5 tablets (150 mg total) by mouth 2 (two) times daily.  120 tablet  6  . lanthanum (FOSRENOL) 1000 MG chewable tablet Chew 1,000 mg by mouth 3 (three) times daily after meals.      Marland Kitchen loratadine (CLARITIN) 10 MG tablet Take 10 mg by mouth daily.       Marland Kitchen losartan (COZAAR) 100 MG tablet Take 100 mg by mouth daily.      . nitroGLYCERIN (NITROSTAT) 0.4 MG SL tablet Place 0.4 mg under the tongue every 5 (five) minutes as needed for chest pain.      Marland Kitchen omeprazole (PRILOSEC) 20 MG capsule 20 mg daily.       . predniSONE (DELTASONE) 20 MG tablet Take 60 mg by mouth See admin instructions. Take 3 tabs (60mg ) by mouth at 6:00 pm the evening prior to cath, midnight, & 6:00 am the morning of the cath      . PROCTOSOL HC 2.5 % rectal cream       . SENSIPAR 30 MG tablet Take 30 mg by mouth at bedtime.       . simvastatin (ZOCOR) 20 MG tablet Take 1 tablet (20 mg total) by mouth at bedtime.  90 tablet  3  . tetrahydrozoline 0.05 % ophthalmic solution Place 1 drop into both eyes 2 (two) times daily as needed (irritation).      Marland Kitchen zolpidem (AMBIEN) 10 MG tablet Take 10 mg by mouth at bedtime as needed for sleep.        No current facility-administered medications for this visit.    Past Medical History  Diagnosis Date  . ESRD on hemodialysis     ESRD due to HTN, started dialysis 2011 and gets HD at [facility] with Dr Hinda Lenis on MWF schedule.  Access is LUA AVF as of Sept 2014.   Marland Kitchen Chronic bronchitis   . GERD (gastroesophageal reflux disease)   . PUD (peptic ulcer disease)   . History of lower GI bleeding   . Arthritis   . History of gout   . CAD (coronary artery disease)     a. 12/2011 NSTEMI/Cath/PCI LCX (2.25x14 Resolute DES) & D1 (2.25x22 Resolute DES);  b. 01/2012 Cath/PCI: LM 30, LAD 30p, 40-34m, D1 stent ok, 99 in sm branch of diag, LCX patent stent, OM1 20, RCA 95 ost (4.0x12 Promus DES),  EF 55%;  c. 04/2012 Lexi Cardiolite  EF 48%, small area of scar @ base/mid inflat wall with mild peri-infarct ischemia.; CABG 12/4  . High cholesterol 12/2011  . Pneumonia ~ 2009  . Iron deficiency anemia   . TIA (transient ischemic attack)   . Anxiety   . History of blood transfusion 07/2011; 12/2011; 01/2012 X 2; 04/2012  . Carotid artery disease  a. [id number] LICA, Q000111Q   . Mitral regurgitation     a. Moderate by echo, 02/2012  . Myocardial infarction   . Ovarian cancer 1992  . Colon cancer 1992  . Chronic diastolic CHF (congestive heart failure)     a. 02/2012 Echo EF 60-65%, nl wall motion, Gr 1 DD, mod MR  . Hypertension   . AVM (arteriovenous malformation) of colon   . Esophageal stricture     Past Surgical History  Procedure Laterality Date  . Abdominal hysterectomy  1992  . Appendectomy  06/1990  . Tubal ligation  1980's  . Av fistula placement  07/2009    left upper arm  . Thrombectomy / arteriovenous graft revision  2011    left upper arm  . Colon resection  1992  . Esophagogastroduodenoscopy  01/20/2012    Procedure: ESOPHAGOGASTRODUODENOSCOPY (EGD);  Surgeon: Ladene Artist, MD,FACG;  Location: [facility] ENDOSCOPY;  Service: Endoscopy;  Laterality: N/A;  . Dilation and curettage of uterus    . Coronary angioplasty with stent placement  12/15/11    "2"  . Coronary angioplasty with stent placement  y/2013    "1; makes total of 3" (05/02/2012)  . Coronary artery bypass graft  06/13/2012    Procedure: CORONARY ARTERY BYPASS GRAFTING (CABG);  Surgeon: Grace Isaac, MD;  Location: [place];  Service: Open Heart Surgery;  Laterality: N/A;  cabg x four;  using left internal mammary artery, and left leg greater saphenous vein harvested endoscopically  . Intraoperative transesophageal echocardiogram  06/13/2012    Procedure: INTRAOPERATIVE TRANSESOPHAGEAL ECHOCARDIOGRAM;  Surgeon: Grace Isaac, MD;  Location: [place];  Service: Open Heart Surgery;  Laterality: N/A;  . Colectomy   1992  . Esophagogastroduodenoscopy N/A 03/26/2013    Procedure: ESOPHAGOGASTRODUODENOSCOPY (EGD);  Surgeon: Irene Shipper, MD;  Location: [facility] ENDOSCOPY;  Service: Endoscopy;  Laterality: N/A;  . Stomach surgery      ROS: Review of systems complete and found to be negative unless listed above  PHYSICAL EXAM BP 190/90  Pulse 80  Ht 5\' 1"  (1.549 m)  Wt 147 lb (66.679 kg)  BMI 27.79 kg/m2 General: Well developed, well nourished, in no acute distress Head: Eyes PERRLA, No xanthomas.   Normal cephalic and atramatic  Lungs: Clear bilaterally to auscultation and percussion. Heart: HRRR S1 S2 distant heart sounds without S4  Pulses are 2+ & equal.            No carotid bruit. No JVD.  No abdominal bruits. No femoral bruits. Abdomen: Bowel sounds are positive, abdomen soft and non-tender without masses or                  Hernia's noted. Msk:  Back normal, normal gait. Normal strength and tone for age. Extremities: No clubbing, cyanosis or edema.  DP +1 Neuro: Alert and oriented X 3. Psych:  Good affect, responds appropriately     ASSESSMENT AND PLAN

## 2013-12-17 NOTE — Assessment & Plan Note (Signed)
Cardiac catheterization was completed by Dr. Ellwood Sayers on 12/10/2013. This revealed patent left main coronary artery focal significant disease in the mid to distal left anterior descending artery patent diagonal branch LIMA to LAD patent severe restenosis of the proximal left circumflex artery stent, patent SVG to circumflex, severe restenosis of the proximal left circumflex artery stent. Patent SVG to circumflex, 70% proximal ramus stenosis. Widely patent SVG to ramus. Occluded stent at the ostium of the right coronary artery. Widely patent SVG to distal RCA. Left ventricular systolic function was not assessed.  She will be continued on her current cardiac medications to include stat therapy along with labetalol, with increasing doses of antihypertensives for better blood pressure control.

## 2013-12-23 ENCOUNTER — Ambulatory Visit (INDEPENDENT_AMBULATORY_CARE_PROVIDER_SITE_OTHER): Payer: Medicare Other | Admitting: *Deleted

## 2013-12-23 VITALS — BP 172/70 | HR 72

## 2013-12-23 DIAGNOSIS — I10 Essential (primary) hypertension: Secondary | ICD-10-CM

## 2013-12-23 MED ORDER — CLONIDINE HCL 0.2 MG PO TABS: 0.2000 mg | ORAL_TABLET | Freq: Two times a day (BID) | ORAL | Status: DC

## 2013-12-23 NOTE — Progress Notes (Signed)
Pt present for BP visit. Pt brought her wrist BP monitor to compare with manual that I take today. Her monitor says 148/75, ours is 172/70. Gave pt a brand new arm BP monitor to take home with her. KL,NP changed her Clonidine to 0.2 mg twice a day.

## 2013-12-23 NOTE — Patient Instructions (Addendum)
Your physician recommends that you schedule a follow-up appointment in:  01/16/14 @ 1:10 pm with Jory Sims, NP  Your physician has recommended you make the following change in your medication:   Increased Clonidine 0.2 mg twice a day, Please pick up at [facility].     Gave pt a new blood pressure monitor.

## 2014-01-09 ENCOUNTER — Other Ambulatory Visit (HOSPITAL_COMMUNITY): Payer: Self-pay | Admitting: Internal Medicine

## 2014-01-16 ENCOUNTER — Ambulatory Visit (INDEPENDENT_AMBULATORY_CARE_PROVIDER_SITE_OTHER): Payer: Medicare Other | Admitting: Adult Health

## 2014-01-16 ENCOUNTER — Encounter: Payer: Self-pay | Admitting: Adult Health

## 2014-01-16 VITALS — BP 122/60 | HR 81 | Ht 61.0 in | Wt 146.0 lb

## 2014-01-16 DIAGNOSIS — N898 Other specified noninflammatory disorders of vagina: Secondary | ICD-10-CM

## 2014-01-16 MED ORDER — FLUCONAZOLE 150 MG PO TABS: 150.0000 mg | ORAL_TABLET | Freq: Every day | ORAL | Status: DC

## 2014-01-16 MED ORDER — ISOSORBIDE DINITRATE 10 MG PO TABS: 10.0000 mg | ORAL_TABLET | Freq: Three times a day (TID) | ORAL | Status: DC

## 2014-01-16 NOTE — Assessment & Plan Note (Addendum)
BP is the best I have seen it in months. She is off prednisone. She has a home BP machine that we have provide for her. She is easily anxious. I will continue her on anti-hypertensive meds she is currently taking. She is advised to only take BP twice a day and not fixate on her BP recordings as BP changes throughout the day.I have refilled isosorbide,

## 2014-01-16 NOTE — Assessment & Plan Note (Signed)
No evidence of fluid overload. Volume is managed with dialysis.

## 2014-01-16 NOTE — Progress Notes (Deleted)
Name: Shawna [name]    DOB: [birth year]  Age: 73 y.o.  MR#: [id number]       PCP:  [name], MD      Insurance: Payor: [employer] MEDICARE / Plan: AARP MEDICARE COMPLETE / Product Type: *No Product type* /   CC:    Chief Complaint  Patient presents with  . Coronary Artery Disease    S/P CABG    VS Filed Vitals:   01/16/14 1315  BP: 122/60  Pulse: 81  Height: 5\' 1"  (1.549 m)  Weight: 146 lb (66.225 kg)  SpO2: 98%    Weights Current Weight  01/16/14 146 lb (66.225 kg)  12/17/13 147 lb (66.679 kg)  12/10/13 144 lb (65.318 kg)    Blood Pressure  BP Readings from Last 3 Encounters:  01/16/14 122/60  12/23/13 172/70  12/17/13 190/90     Admit date:  (Not on file) Last encounter with RMR:  12/17/2013   Allergy Aspirin; Contrast media; Iron; Macrodantin; Penicillins; Plavix; Bactrim; Sulfa antibiotics; Venofer; Dexilant; Prilosec; Levaquin; and Protonix  Current Outpatient Prescriptions  Medication Sig Dispense Refill  . albuterol (PROVENTIL HFA;VENTOLIN HFA) 108 (90 BASE) MCG/ACT inhaler Inhale 2 puffs into the lungs every 6 (six) hours as needed for wheezing or shortness of breath.       . ALPRAZolam (XANAX) 0.5 MG tablet Take 0.5 mg by mouth 3 (three) times daily.      Marland Kitchen amLODipine (NORVASC) 10 MG tablet Take 1 tablet (10 mg total) by mouth daily.  30 tablet  6  . aspirin EC 81 MG tablet Take 81 mg by mouth daily.      . cloNIDine (CATAPRES) 0.2 MG tablet Take 1 tablet (0.2 mg total) by mouth 2 (two) times daily.  60 tablet  11  . diphenhydrAMINE (SOMINEX) 25 MG tablet Take 25 mg by mouth See admin instructions. Take 1 tab (25mg ) by mouth at 6:00 pm the evening prior to cath, midnight, & 6:00 am the morning of the cath      . fluticasone (FLONASE) 50 MCG/ACT nasal spray Place 2 sprays into the nose daily as needed for allergies.       . folic acid-vitamin b complex-vitamin c-selenium-zinc (DIALYVITE) 3 MG TABS Take 1 tablet by mouth daily.      . isosorbide dinitrate  (ISORDIL) 10 MG tablet Take 1 tablet (10 mg total) by mouth 3 (three) times daily.  90 tablet  0  . labetalol (NORMODYNE) 100 MG tablet Take 1.5 tablets (150 mg total) by mouth 2 (two) times daily.  120 tablet  6  . lanthanum (FOSRENOL) 1000 MG chewable tablet Chew 1,000 mg by mouth 3 (three) times daily after meals.      Marland Kitchen loratadine (CLARITIN) 10 MG tablet Take 10 mg by mouth daily.       Marland Kitchen losartan (COZAAR) 100 MG tablet Take 100 mg by mouth daily.      . nitroGLYCERIN (NITROSTAT) 0.4 MG SL tablet Place 0.4 mg under the tongue every 5 (five) minutes as needed for chest pain.      Marland Kitchen omeprazole (PRILOSEC) 20 MG capsule 20 mg daily.       Marland Kitchen PROCTOSOL HC 2.5 % rectal cream       . SENSIPAR 30 MG tablet Take 30 mg by mouth at bedtime.       . simvastatin (ZOCOR) 20 MG tablet Take 1 tablet (20 mg total) by mouth at bedtime.  90 tablet  3  . tetrahydrozoline  0.05 % ophthalmic solution Place 1 drop into both eyes 2 (two) times daily as needed (irritation).      Marland Kitchen zolpidem (AMBIEN) 10 MG tablet Take 10 mg by mouth at bedtime as needed for sleep.        No current facility-administered medications for this visit.    Discontinued Meds:    Medications Discontinued During This Encounter  Medication Reason  . predniSONE (DELTASONE) 20 MG tablet Error    Patient Active Problem List   Diagnosis Date Noted  . Chest pain 11/26/2013  . Small bowel obstruction, partial 05/29/2013  . Chronic systolic CHF (congestive heart failure) 03/22/2013  . GI bleeding 03/21/2013  . Acute blood loss anemia 03/21/2013  . Vaginal odor 03/12/2013  . Vaginal discharge 03/12/2013  . Abdominal  pain, other specified site 03/12/2013  . Occlusion and stenosis of carotid artery without mention of cerebral infarction 01/24/2013  . Hx of CABG 07/05/2012  . Carotid artery disease 07/05/2012  . Anemia of chronic kidney failure 07/03/2012  . Secondary hyperparathyroidism 07/03/2012  . Mitral regurgitation 06/12/2012  .  Pneumonia 06/09/2012  . Non-STEMI (non-ST elevated myocardial infarction) 06/08/2012  . GERD (gastroesophageal reflux disease) 01/09/2012  . HLD (hyperlipidemia) 01/05/2012  . Coronary atherosclerosis of native coronary artery 12/16/2011  . Essential hypertension, benign 12/16/2011  . ESRD on hemodialysis 12/16/2011    LABS    Component Value Date/Time   NA 134* 12/10/2013 1125   NA 144 11/26/2013 1158   NA 138 10/15/2013 0713   K 4.8 12/10/2013 1125   K 4.9 11/26/2013 1158   K 4.2 10/15/2013 0713   CL 92* 12/10/2013 1125   CL 97 11/26/2013 1158   CL 99 10/15/2013 0713   CO2 22 12/10/2013 1125   CO2 32 11/26/2013 1158   CO2 29 06/01/2013 0348   GLUCOSE 137* 12/10/2013 1125   GLUCOSE 89 11/26/2013 1158   GLUCOSE 176* 10/15/2013 0713   BUN 40* 12/10/2013 1125   BUN 43* 11/26/2013 1158   BUN 33* 10/15/2013 0713   CREATININE 7.76* 12/10/2013 1125   CREATININE 8.74* 11/26/2013 1158   CREATININE 8.30* 10/15/2013 0713   CALCIUM 9.0 12/10/2013 1125   CALCIUM 8.3* 11/26/2013 1158   CALCIUM 9.8 06/01/2013 0348   CALCIUM 8.1* 05/29/2013 1814   GFRNONAA 5* 12/10/2013 1125   GFRNONAA 4* 11/26/2013 1158   GFRNONAA 6* 06/01/2013 0348   GFRAA 5* 12/10/2013 1125   GFRAA 5* 11/26/2013 1158   GFRAA 7* 06/01/2013 0348   CMP     Component Value Date/Time   NA 134* 12/10/2013 1125   K 4.8 12/10/2013 1125   CL 92* 12/10/2013 1125   CO2 22 12/10/2013 1125   GLUCOSE 137* 12/10/2013 1125   BUN 40* 12/10/2013 1125   CREATININE 7.76* 12/10/2013 1125   CALCIUM 9.0 12/10/2013 1125   CALCIUM 8.1* 05/29/2013 1814   PROT 6.0 05/29/2013 1814   ALBUMIN 4.0 06/01/2013 0348   AST 18 05/29/2013 1814   ALT 9 05/29/2013 1814   ALKPHOS 98 05/29/2013 1814   BILITOT 0.4 05/29/2013 1814   GFRNONAA 5* 12/10/2013 1125   GFRAA 5* 12/10/2013 1125       Component Value Date/Time   WBC 8.1 12/10/2013 1125   WBC 5.6 11/26/2013 1158   WBC 7.9 06/01/2013 0348   HGB 9.8* 12/10/2013 1125   HGB 11.2* 11/26/2013 1158   HGB 11.9* 10/15/2013 0713   HCT 29.8* 12/10/2013  1125   HCT 35.4* 11/26/2013 1158   HCT  35.0* 10/15/2013 0713   MCV 100.0 12/10/2013 1125   MCV 103.2* 11/26/2013 1158   MCV 99.5 06/01/2013 0348    Lipid Panel     Component Value Date/Time   CHOL 129 07/07/2012 0815   TRIG 97 07/07/2012 0815   HDL 51 07/07/2012 0815   CHOLHDL 2.5 07/07/2012 0815   VLDL 19 07/07/2012 0815   LDLCALC 59 07/07/2012 0815    ABG    Component Value Date/Time   PHART 7.302* 06/14/2012 0510   PCO2ART 44.9 06/14/2012 0510   PO2ART 120.0* 06/14/2012 0510   HCO3 22.2 06/14/2012 0510   TCO2 26 10/15/2013 0713   ACIDBASEDEF 4.0* 06/14/2012 0510   O2SAT 98.0 06/14/2012 0510     Lab Results  Component Value Date   TSH 1.758 09/05/2012   BNP (last 3 results) No results found for this basename: PROBNP,  in the last 8760 hours Cardiac Panel (last 3 results) No results found for this basename: CKTOTAL, CKMB, TROPONINI, RELINDX,  in the last 72 hours  Iron/TIBC/Ferritin/ %Sat    Component Value Date/Time   IRON 14* 05/29/2013 1814   TIBC 194* 05/29/2013 1814   FERRITIN 243 06/11/2012 0728   IRONPCTSAT 7* 05/29/2013 1814     EKG Orders placed during the hospital encounter of 11/26/13  . ED EKG  . ED EKG  . EKG     Prior Assessment and Plan Problem List as of 01/16/2014     Cardiovascular and Mediastinum   Coronary atherosclerosis of native coronary artery   Last Assessment & Plan   08/14/2013 Office Visit Written 08/14/2013  4:05 PM by Lendon Colonel, NP     Doubt symptoms are related to ACS or cardiac etiology at this time. She states that her cardiac pain is more of a pressure with radiation to her left arm with profound weakness. This is more of a sticking pain. It is associated with muscled cramps in her hands during dialysis. Will not plan and cardiac testing. Reassurance is given. She has had labs drawn by nephrology today during dialysis. She is to have results discussed on Friday during next treatment. Will see her again in one month unless symptoms  progress or worsen.    Essential hypertension, benign   Last Assessment & Plan   12/17/2013 Office Visit Written 12/17/2013  4:16 PM by Lendon Colonel, NP     The patient denies medical noncompliance. She states she takes her blood pressure at home and is normally lower in the Q000111Q systolic. Cardiac catheterization did not reveal new areas of occlusion or disease. She was continued on her current medication with reinforcement and need to be compliant. The patient is adamant that she takes her medicines as directed.  I would increase her amlodipine from 5 mg daily to 10 mg daily. She will come back in a week for blood pressure check and also bring her blood pressure monitor with her to compare readings. May need to increase clonidine should this to necessary. Right OA 2 lobe a blood pressure as this will not be tolerated during dialysis.    Non-STEMI (non-ST elevated myocardial infarction)   Mitral regurgitation   Carotid artery disease   Occlusion and stenosis of carotid artery without mention of cerebral infarction   Chronic systolic CHF (congestive heart failure)   Last Assessment & Plan   08/14/2013 Office Visit Written 08/14/2013  4:07 PM by Lendon Colonel, NP     She does not appear to be fluid overloaded  at present. No changes to medication regimen.      Respiratory   Pneumonia     Digestive   GERD (gastroesophageal reflux disease)   GI bleeding   Small bowel obstruction, partial     Endocrine   Secondary hyperparathyroidism     Genitourinary   Anemia of chronic kidney failure   ESRD on hemodialysis   Last Assessment & Plan   12/17/2013 Office Visit Written 12/17/2013  4:20 PM by Lendon Colonel, NP     The patient has dialysis on Monday Wednesdays Fridays. She states that her blood pressures been rising during dialysis consider decreasing as it normally does. She also states that when her blood pressure drops to below [id number] systolic she becomes very lightheaded and dizzy and is  unable tolerate a lower blood pressure. Close followup of her blood pressure after increase in amlodipine is planned      Other   HLD (hyperlipidemia)   Last Assessment & Plan   09/20/2012 Office Visit Written 09/20/2012  1:38 PM by Donney Dice, PA-C     Will reassess lipid status with a FLP. Recommend target LDL 70 or less, if feasible. Continue current low-dose Lipitor, pending review of results.    Hx of CABG   Last Assessment & Plan   12/17/2013 Office Visit Written 12/17/2013  4:19 PM by Lendon Colonel, NP     Cardiac catheterization was completed by Dr. Ellwood Sayers on 12/10/2013. This revealed patent left main coronary artery focal significant disease in the mid to distal left anterior descending artery patent diagonal branch LIMA to LAD patent severe restenosis of the proximal left circumflex artery stent, patent SVG to circumflex, severe restenosis of the proximal left circumflex artery stent. Patent SVG to circumflex, 70% proximal ramus stenosis. Widely patent SVG to ramus. Occluded stent at the ostium of the right coronary artery. Widely patent SVG to distal RCA. Left ventricular systolic function was not assessed.  She will be continued on her current cardiac medications to include stat therapy along with labetalol, with increasing doses of antihypertensives for better blood pressure control.    Vaginal odor   Vaginal discharge   Abdominal  pain, other specified site   Acute blood loss anemia   Chest pain       Imaging: No results found.

## 2014-01-16 NOTE — Patient Instructions (Addendum)
Your physician wants you to follow-up in: 6 months with Dr. Bronson Ing. You will receive a reminder letter in the mail two months in advance. If you don't receive a letter, please call our office to schedule the follow-up appointment.  Your physician has recommended you make the following change in your medication:   TAKE DIFLUCAN 150 MG DAILY FOR 2 DAYS  We have refilled your isosorbide 10 mg  Thank you for choosing [facility]!!

## 2014-01-16 NOTE — Assessment & Plan Note (Signed)
I have given her Rx for two days of Diflucan 150 mg. IF continues to be an issue, should see PCP.

## 2014-01-16 NOTE — Progress Notes (Signed)
HPI: Shawna [name] is a 73 year old patient of Dr. Pennelope Bracken were on for ongoing assessment and management of CAD, status post coronary artery bypass grafting, chronic diastolic CHF and hypertension with end-stage renal disease. Patient was last seen in the office in June of 2015. This is status post cardiac catheterization on 12/10/2013 revealing patent left main coronary artery, with focal significant disease in the mid and distal left anterior descending artery, patent diagonal branch LIMA to LAD, widely patent, severe restenosis of the proximal left circumflex artery, stent placement SVG to circumflex 70% proximal ramus stenosis widely patent SVG to ramus. Occlusive the ostium of the right coronary artery, widely patent SVG to distal RCA, LVEF was not assessed.   She was continued on medical therapy, office followup the patient was found be hypertensive. Blood pressure was 190/90. She did.medical noncompliance. He states that her blood pressure is normal her lower home. Increase her amlodipine from 5 mg daily to 10 mg daily. She was to come back for blood pressure check and her blood pressure monitor to compare readings.. She returned her blood pressure was 172/88.  She is doing much better but ran out of isosorbide 10 mg TID yesterday. BP was up last evening. She is complaining of yeast infection after course of prednisone.   Allergies  Allergen Reactions  . Aspirin Other (See Comments)    Mess up her stomach; "makes my bowels have blood in them". Takes 81 mg EC Aspirin   . Contrast Media [Iodinated Diagnostic Agents] Itching  . Iron Itching and Other (See Comments)    "they gave me iron in dialysis; had to give me Benadryl cause I had to have the iron" (05/02/2012)  . Macrodantin [Nitrofurantoin Macrocrystal] Other (See Comments)    "broke me out in big old knots all over my body; had to go to ER"  . Penicillins Other (See Comments)    "makes me real weak when I take it; like I'll pass  out"  . Plavix [Clopidogrel Bisulfate] Rash  . Bactrim [Sulfamethoxazole-Trimethoprim] Rash  . Sulfa Antibiotics Rash  . Venofer [Ferric Oxide] Itching    Patient reports using Benadryl prior to doses as [facility]  . Dexilant [Dexlansoprazole] Other (See Comments)    Upset stomach  . Prilosec [Omeprazole] Other (See Comments)    "back spasms"  . Levaquin [Levofloxacin In D5w] Rash  . Protonix [Pantoprazole Sodium] Rash    Current Outpatient Prescriptions  Medication Sig Dispense Refill  . albuterol (PROVENTIL HFA;VENTOLIN HFA) 108 (90 BASE) MCG/ACT inhaler Inhale 2 puffs into the lungs every 6 (six) hours as needed for wheezing or shortness of breath.       . ALPRAZolam (XANAX) 0.5 MG tablet Take 0.5 mg by mouth 3 (three) times daily.      Marland Kitchen amLODipine (NORVASC) 10 MG tablet Take 1 tablet (10 mg total) by mouth daily.  30 tablet  6  . aspirin EC 81 MG tablet Take 81 mg by mouth daily.      . cloNIDine (CATAPRES) 0.2 MG tablet Take 1 tablet (0.2 mg total) by mouth 2 (two) times daily.  60 tablet  11  . diphenhydrAMINE (SOMINEX) 25 MG tablet Take 25 mg by mouth See admin instructions. Take 1 tab (25mg ) by mouth at 6:00 pm the evening prior to cath, midnight, & 6:00 am the morning of the cath      . fluticasone (FLONASE) 50 MCG/ACT nasal spray Place 2 sprays into the nose daily as needed for  allergies.       . folic acid-vitamin b complex-vitamin c-selenium-zinc (DIALYVITE) 3 MG TABS Take 1 tablet by mouth daily.      . isosorbide dinitrate (ISORDIL) 10 MG tablet Take 1 tablet (10 mg total) by mouth 3 (three) times daily.  90 tablet  0  . labetalol (NORMODYNE) 100 MG tablet Take 1.5 tablets (150 mg total) by mouth 2 (two) times daily.  120 tablet  6  . lanthanum (FOSRENOL) 1000 MG chewable tablet Chew 1,000 mg by mouth 3 (three) times daily after meals.      Marland Kitchen loratadine (CLARITIN) 10 MG tablet Take 10 mg by mouth daily.       Marland Kitchen losartan (COZAAR) 100 MG tablet Take 100 mg by mouth  daily.      . nitroGLYCERIN (NITROSTAT) 0.4 MG SL tablet Place 0.4 mg under the tongue every 5 (five) minutes as needed for chest pain.      Marland Kitchen omeprazole (PRILOSEC) 20 MG capsule 20 mg daily.       Marland Kitchen PROCTOSOL HC 2.5 % rectal cream       . SENSIPAR 30 MG tablet Take 30 mg by mouth at bedtime.       . simvastatin (ZOCOR) 20 MG tablet Take 1 tablet (20 mg total) by mouth at bedtime.  90 tablet  3  . tetrahydrozoline 0.05 % ophthalmic solution Place 1 drop into both eyes 2 (two) times daily as needed (irritation).      Marland Kitchen zolpidem (AMBIEN) 10 MG tablet Take 10 mg by mouth at bedtime as needed for sleep.        No current facility-administered medications for this visit.    Past Medical History  Diagnosis Date  . ESRD on hemodialysis     ESRD due to HTN, started dialysis 2011 and gets HD at [facility] with Dr Hinda Lenis on MWF schedule.  Access is LUA AVF as of Sept 2014.   Marland Kitchen Chronic bronchitis   . GERD (gastroesophageal reflux disease)   . PUD (peptic ulcer disease)   . History of lower GI bleeding   . Arthritis   . History of gout   . CAD (coronary artery disease)     a. 12/2011 NSTEMI/Cath/PCI LCX (2.25x14 Resolute DES) & D1 (2.25x22 Resolute DES);  b. 01/2012 Cath/PCI: LM 30, LAD 30p, 40-70m, D1 stent ok, 99 in sm branch of diag, LCX patent stent, OM1 20, RCA 95 ost (4.0x12 Promus DES), EF 55%;  c. 04/2012 Lexi Cardiolite  EF 48%, small area of scar @ base/mid inflat wall with mild peri-infarct ischemia.; CABG 12/4  . High cholesterol 12/2011  . Pneumonia ~ 2009  . Iron deficiency anemia   . TIA (transient ischemic attack)   . Anxiety   . History of blood transfusion 07/2011; 12/2011; 01/2012 X 2; 04/2012  . Carotid artery disease     a. [id number] LICA, [id number]   . Mitral regurgitation     a. Moderate by echo, 02/2012  . Myocardial infarction   . Ovarian cancer 1992  . Colon cancer 1992  . Chronic diastolic CHF (congestive heart failure)     a. 02/2012 Echo EF 60-65%, nl wall motion, Gr 1 DD,  mod MR  . Hypertension   . AVM (arteriovenous malformation) of colon   . Esophageal stricture     Past Surgical History  Procedure Laterality Date  . Abdominal hysterectomy  1992  . Appendectomy  06/1990  . Tubal ligation  1980's  . Av fistula  placement  07/2009    left upper arm  . Thrombectomy / arteriovenous graft revision  2011    left upper arm  . Colon resection  1992  . Esophagogastroduodenoscopy  01/20/2012    Procedure: ESOPHAGOGASTRODUODENOSCOPY (EGD);  Surgeon: Ladene Artist, MD,FACG;  Location: [facility] ENDOSCOPY;  Service: Endoscopy;  Laterality: N/A;  . Dilation and curettage of uterus    . Coronary angioplasty with stent placement  12/15/11    "2"  . Coronary angioplasty with stent placement  y/2013    "1; makes total of 3" (05/02/2012)  . Coronary artery bypass graft  06/13/2012    Procedure: CORONARY ARTERY BYPASS GRAFTING (CABG);  Surgeon: Grace Isaac, MD;  Location: [place];  Service: Open Heart Surgery;  Laterality: N/A;  cabg x four;  using left internal mammary artery, and left leg greater saphenous vein harvested endoscopically  . Intraoperative transesophageal echocardiogram  06/13/2012    Procedure: INTRAOPERATIVE TRANSESOPHAGEAL ECHOCARDIOGRAM;  Surgeon: Grace Isaac, MD;  Location: [place];  Service: Open Heart Surgery;  Laterality: N/A;  . Colectomy  1992  . Esophagogastroduodenoscopy N/A 03/26/2013    Procedure: ESOPHAGOGASTRODUODENOSCOPY (EGD);  Surgeon: Irene Shipper, MD;  Location: [facility] ENDOSCOPY;  Service: Endoscopy;  Laterality: N/A;  . Stomach surgery      ROS: Review of systems complete and found to be negative unless listed above  PHYSICAL EXAM BP 122/60  Pulse 81  Ht 5\' 1"  (1.549 m)  Wt 146 lb (66.225 kg)  BMI 27.60 kg/m2  SpO2 98% General: Well developed, well nourished, in no acute distress Head: Eyes PERRLA, No xanthomas.   Normal cephalic and atramatic  Lungs: Clear bilaterally to auscultation and percussion. Heart: HRRR S1 S2, without  MRG.  Pulses are 2+ & equal.            No carotid bruit. No JVD.  No abdominal bruits. No femoral bruits. Abdomen: Bowel sounds are positive, abdomen soft and non-tender without masses or                  Hernia's noted. Msk:  Back normal, normal gait. Normal strength and tone for age. Extremities: No clubbing, cyanosis or edema.  DP +1 Neuro: Alert and oriented X 3. Psych:  Good affect, responds appropriately     ASSESSMENT AND PLAN

## 2014-02-06 IMAGING — CR DG ABD PORTABLE 1V
1 series · 1 of 1 positions shown · non-contrast
Comparison: None.

CLINICAL DATA: Acute back pain

EXAM:
PORTABLE ABDOMEN - 1 VIEW

[AP]
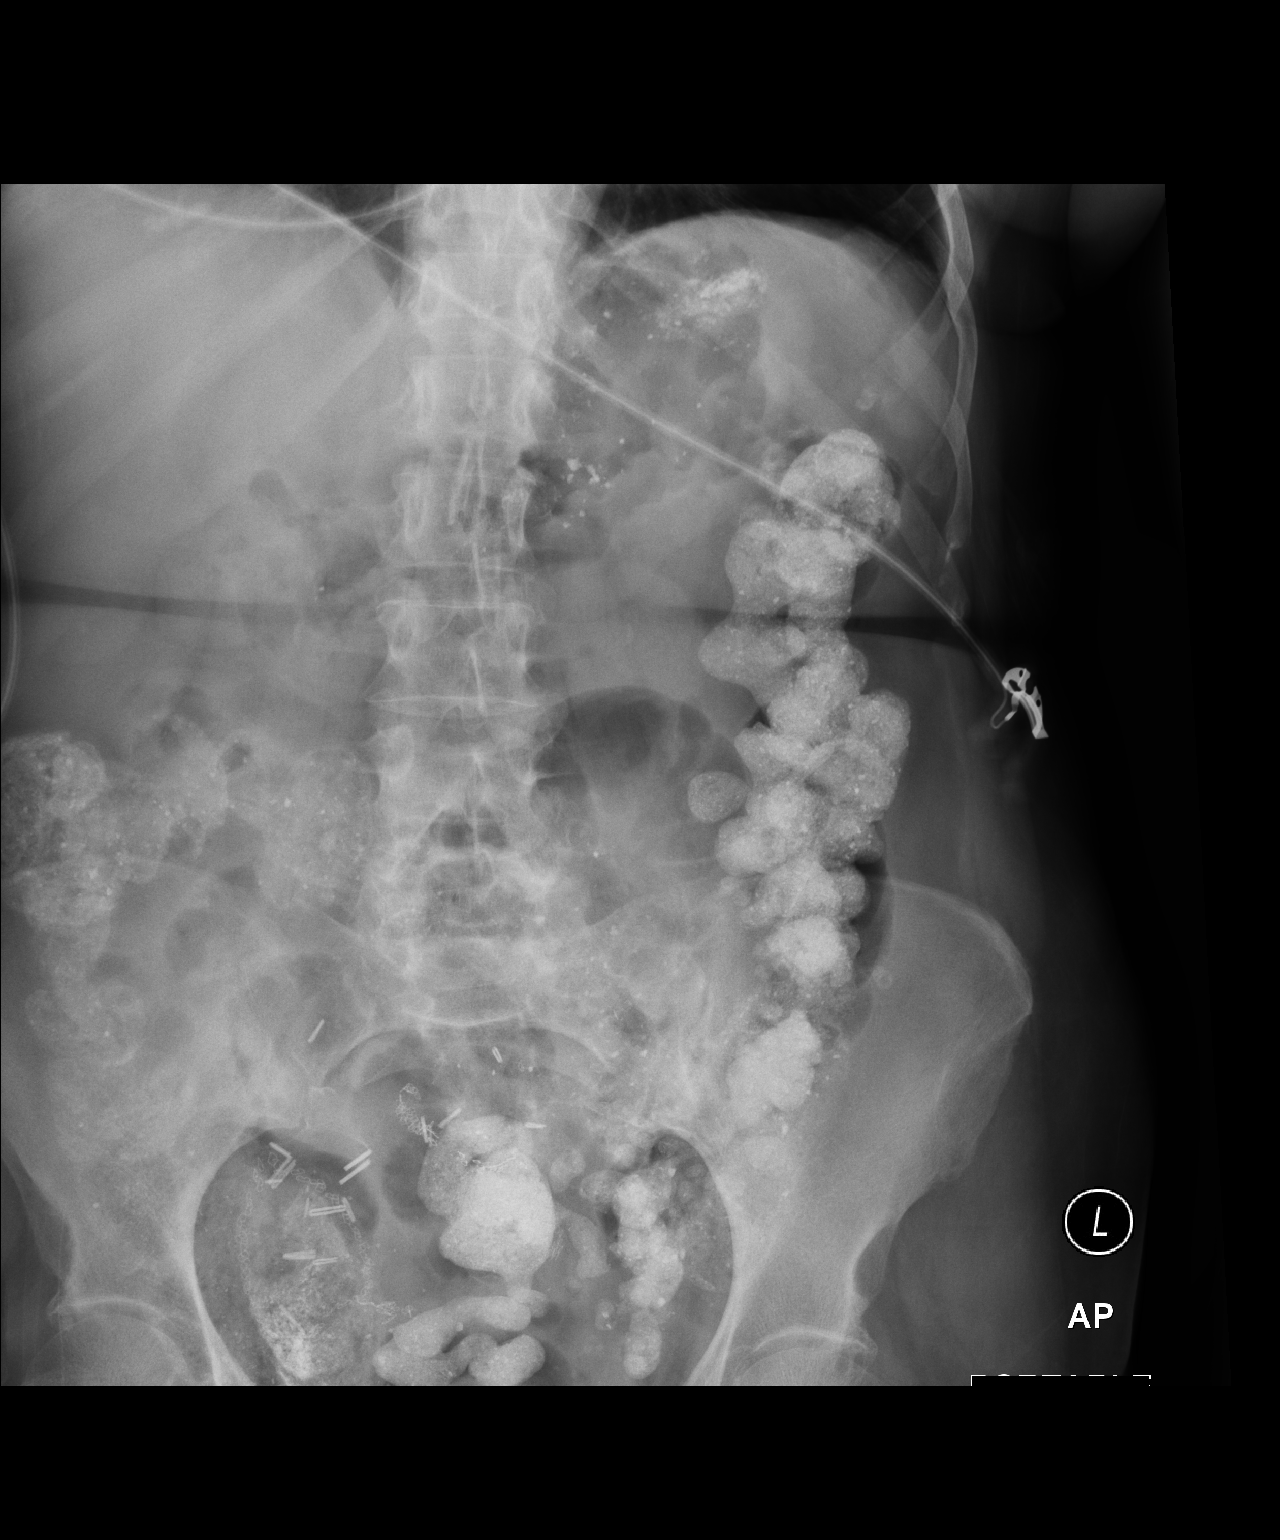

[1 of 1 positions shown; findings below may reference images not displayed]

FINDINGS: The bowel gas pattern is normal. Residual contrast is identified
within the bowel. Multiple surgical clips and sutures are noted in
the pelvis. There is scoliosis of spine.
IMPRESSION: No bowel obstruction.

## 2014-02-10 ENCOUNTER — Telehealth: Payer: Self-pay

## 2014-02-10 ENCOUNTER — Ambulatory Visit (INDEPENDENT_AMBULATORY_CARE_PROVIDER_SITE_OTHER): Payer: Medicare Other | Admitting: Gastroenterology

## 2014-02-10 ENCOUNTER — Encounter: Payer: Self-pay | Admitting: Gastroenterology

## 2014-02-10 VITALS — BP 118/70 | HR 76 | Ht 61.0 in | Wt 144.2 lb

## 2014-02-10 DIAGNOSIS — Z86010 Personal history of colonic polyps: Secondary | ICD-10-CM

## 2014-02-10 DIAGNOSIS — D509 Iron deficiency anemia, unspecified: Secondary | ICD-10-CM

## 2014-02-10 DIAGNOSIS — K5520 Angiodysplasia of colon without hemorrhage: Secondary | ICD-10-CM

## 2014-02-10 DIAGNOSIS — K219 Gastro-esophageal reflux disease without esophagitis: Secondary | ICD-10-CM

## 2014-02-10 DIAGNOSIS — K589 Irritable bowel syndrome without diarrhea: Secondary | ICD-10-CM

## 2014-02-10 MED ORDER — DICYCLOMINE HCL 10 MG PO CAPS: 10.0000 mg | ORAL_CAPSULE | Freq: Three times a day (TID) | ORAL | Status: DC

## 2014-02-10 MED ORDER — OMEPRAZOLE 20 MG PO CPDR: 20.0000 mg | DELAYED_RELEASE_CAPSULE | Freq: Every day | ORAL | Status: DC

## 2014-02-10 NOTE — Progress Notes (Signed)
    History of Present Illness: This is a 73 year old female with a history of GERD, small bowel AVMs and adenomatous colon polyps. Most recent hemoglobin=9.8. It is challenging to get a detailed history from her as her description of her symptoms is vague and wandering. She reports intermittent crampy periumbilical abdominal pain, gas, loose and urgent stools and bloating. As best as I can tell these symptoms have been present for several years and are slightly worse recently.  Current Medications, Allergies, Past Medical History, Past Surgical History, Family History and Social History were reviewed in [employer] record.  Physical Exam: General: Well developed , well nourished, no acute distress Head: Normocephalic and atraumatic Eyes:  sclerae anicteric, EOMI Ears: Normal auditory acuity Mouth: No deformity or lesions Lungs: Clear throughout to auscultation Heart: Regular rate and rhythm; no murmurs, rubs or bruits Abdomen: Soft, non tender and non distended. No masses, hepatosplenomegaly or hernias noted. Normal Bowel sounds Musculoskeletal: Symmetrical with no gross deformities  Pulses:  Normal pulses noted Extremities: No clubbing, cyanosis, edema or deformities noted Neurological: Alert oriented x 4, grossly nonfocal Psychological:  Alert and cooperative. Normal mood and affect  Assessment and Recommendations:  1. Presumed IBS leading to crampy abdominal pain, bloating, gas and intermittent diarrhea. Begin dicyclomine 10 mg 3 times a day as needed. Begin Gas-X 3 times a day as needed. Begin a low gas diet.  2. GERD. Continue omeprazole 20 mg daily.   3. Personal history of adenomatous colon polyps. Five-year interval surveillance colonoscopy recommended January 2018.   4. Small bowel AVMs and a history of iron deficiency anemia. Iron twice a day for 3 months with meals. Followup with her PCP and nephrologist for anemia mgmt.

## 2014-02-10 NOTE — Telephone Encounter (Signed)
Message copied by Barron Alvine on Mon Feb 10, 2014  4:54 PM ------      Message from: Lucio Edward T      Created: Mon Feb 10, 2014  4:51 PM       Fe 325 mg po bid with meals for 3 months for recurrent Fe def anemia. Follow up with her PCP and nephrologist for further management of anemia.  ------

## 2014-02-10 NOTE — Patient Instructions (Addendum)
You have been entered into our recall for Colonoscopy. We have sent  medications to your pharmacy for you to pick up at your convenience. Gas X three times a day as needed. Low gas diet given today.  CC:  [name] MD

## 2014-02-10 NOTE — Telephone Encounter (Signed)
Left message on machine to call back  

## 2014-02-11 NOTE — Telephone Encounter (Signed)
Results sent to PCP and nephrologist

## 2014-02-11 NOTE — Telephone Encounter (Signed)
OK please forward labs to her nephrologist as well.

## 2014-02-11 NOTE — Telephone Encounter (Signed)
Dr Fuller Plan the patient states she is getting iron every Monday at dialysis, she will notify PCP and I will forward results

## 2014-02-14 ENCOUNTER — Emergency Department (HOSPITAL_COMMUNITY)
Admission: EM | Admit: 2014-02-14 | Discharge: 2014-02-14 | Disposition: A | Discharge status: Home / Self Care | Payer: Medicare Other | Attending: Emergency Medicine | Admitting: Emergency Medicine

## 2014-02-14 ENCOUNTER — Encounter (HOSPITAL_COMMUNITY): Payer: Self-pay | Admitting: Emergency Medicine

## 2014-02-14 ENCOUNTER — Emergency Department (HOSPITAL_COMMUNITY): Payer: Medicare Other

## 2014-02-14 DIAGNOSIS — Z7982 Long term (current) use of aspirin: Secondary | ICD-10-CM | POA: Diagnosis not present

## 2014-02-14 DIAGNOSIS — IMO0002 Reserved for concepts with insufficient information to code with codable children: Secondary | ICD-10-CM | POA: Diagnosis not present

## 2014-02-14 DIAGNOSIS — N186 End stage renal disease: Secondary | ICD-10-CM | POA: Diagnosis not present

## 2014-02-14 DIAGNOSIS — M129 Arthropathy, unspecified: Secondary | ICD-10-CM | POA: Insufficient documentation

## 2014-02-14 DIAGNOSIS — Z872 Personal history of diseases of the skin and subcutaneous tissue: Secondary | ICD-10-CM | POA: Diagnosis not present

## 2014-02-14 DIAGNOSIS — R0789 Other chest pain: Secondary | ICD-10-CM | POA: Insufficient documentation

## 2014-02-14 DIAGNOSIS — Z992 Dependence on renal dialysis: Secondary | ICD-10-CM | POA: Insufficient documentation

## 2014-02-14 DIAGNOSIS — I2510 Atherosclerotic heart disease of native coronary artery without angina pectoris: Secondary | ICD-10-CM | POA: Diagnosis not present

## 2014-02-14 DIAGNOSIS — E875 Hyperkalemia: Secondary | ICD-10-CM | POA: Insufficient documentation

## 2014-02-14 DIAGNOSIS — Z85038 Personal history of other malignant neoplasm of large intestine: Secondary | ICD-10-CM | POA: Insufficient documentation

## 2014-02-14 DIAGNOSIS — I252 Old myocardial infarction: Secondary | ICD-10-CM | POA: Insufficient documentation

## 2014-02-14 DIAGNOSIS — Z951 Presence of aortocoronary bypass graft: Secondary | ICD-10-CM | POA: Insufficient documentation

## 2014-02-14 DIAGNOSIS — Z880 Allergy status to penicillin: Secondary | ICD-10-CM | POA: Diagnosis not present

## 2014-02-14 DIAGNOSIS — Z8701 Personal history of pneumonia (recurrent): Secondary | ICD-10-CM | POA: Insufficient documentation

## 2014-02-14 DIAGNOSIS — I5032 Chronic diastolic (congestive) heart failure: Secondary | ICD-10-CM | POA: Insufficient documentation

## 2014-02-14 DIAGNOSIS — F411 Generalized anxiety disorder: Secondary | ICD-10-CM | POA: Diagnosis not present

## 2014-02-14 DIAGNOSIS — Z8673 Personal history of transient ischemic attack (TIA), and cerebral infarction without residual deficits: Secondary | ICD-10-CM | POA: Insufficient documentation

## 2014-02-14 DIAGNOSIS — Z79899 Other long term (current) drug therapy: Secondary | ICD-10-CM | POA: Insufficient documentation

## 2014-02-14 DIAGNOSIS — Z9861 Coronary angioplasty status: Secondary | ICD-10-CM | POA: Insufficient documentation

## 2014-02-14 DIAGNOSIS — E7800 Pure hypercholesterolemia, unspecified: Secondary | ICD-10-CM | POA: Insufficient documentation

## 2014-02-14 DIAGNOSIS — Z8543 Personal history of malignant neoplasm of ovary: Secondary | ICD-10-CM | POA: Insufficient documentation

## 2014-02-14 DIAGNOSIS — D509 Iron deficiency anemia, unspecified: Secondary | ICD-10-CM | POA: Diagnosis not present

## 2014-02-14 DIAGNOSIS — K219 Gastro-esophageal reflux disease without esophagitis: Secondary | ICD-10-CM | POA: Diagnosis not present

## 2014-02-14 DIAGNOSIS — R079 Chest pain, unspecified: Secondary | ICD-10-CM | POA: Insufficient documentation

## 2014-02-14 DIAGNOSIS — I509 Heart failure, unspecified: Secondary | ICD-10-CM | POA: Diagnosis not present

## 2014-02-14 DIAGNOSIS — I120 Hypertensive chronic kidney disease with stage 5 chronic kidney disease or end stage renal disease: Secondary | ICD-10-CM | POA: Diagnosis not present

## 2014-02-14 LAB — CBC WITH DIFFERENTIAL/PLATELET
BASOS ABS: 0.1 10*3/uL (ref 0.0–0.1)
BASOS PCT: 1 % (ref 0–1)
Eosinophils Absolute: 0.6 10*3/uL (ref 0.0–0.7)
Eosinophils Relative: 9 % — ABNORMAL HIGH (ref 0–5)
HCT: 36.3 % (ref 36.0–46.0)
Hemoglobin: 11.9 g/dL — ABNORMAL LOW (ref 12.0–15.0)
Lymphocytes Relative: 17 % (ref 12–46)
Lymphs Abs: 1.2 10*3/uL (ref 0.7–4.0)
MCH: 32.2 pg (ref 26.0–34.0)
MCHC: 32.8 g/dL (ref 30.0–36.0)
MCV: 98.1 fL (ref 78.0–100.0)
Monocytes Absolute: 0.6 10*3/uL (ref 0.1–1.0)
Monocytes Relative: 8 % (ref 3–12)
NEUTROS ABS: 4.3 10*3/uL (ref 1.7–7.7)
Neutrophils Relative %: 65 % (ref 43–77)
PLATELETS: 236 10*3/uL (ref 150–400)
RBC: 3.7 MIL/uL — ABNORMAL LOW (ref 3.87–5.11)
RDW: 16 % — AB (ref 11.5–15.5)
WBC: 6.8 10*3/uL (ref 4.0–10.5)

## 2014-02-14 LAB — COMPREHENSIVE METABOLIC PANEL
ALBUMIN: 4 g/dL (ref 3.5–5.2)
ALT: 11 U/L (ref 0–35)
AST: 12 U/L (ref 0–37)
Alkaline Phosphatase: 79 U/L (ref 39–117)
BILIRUBIN TOTAL: 0.4 mg/dL (ref 0.3–1.2)
BUN: 49 mg/dL — ABNORMAL HIGH (ref 6–23)
CHLORIDE: 93 meq/L — AB (ref 96–112)
CO2: 23 meq/L (ref 19–32)
Calcium: 9.8 mg/dL (ref 8.4–10.5)
Creatinine, Ser: 9.27 mg/dL — ABNORMAL HIGH (ref 0.50–1.10)
GFR calc Af Amer: 4 mL/min — ABNORMAL LOW (ref >90)
GFR, EST NON AFRICAN AMERICAN: 4 mL/min — AB (ref >90)
Glucose, Bld: 124 mg/dL — ABNORMAL HIGH (ref 70–99)
Potassium: 5.5 mEq/L — ABNORMAL HIGH (ref 3.7–5.3)
SODIUM: 139 meq/L (ref 137–147)
Total Protein: 7.4 g/dL (ref 6.0–8.3)

## 2014-02-14 LAB — TROPONIN I: Troponin I: <0.3 ng/mL (ref <0.30)

## 2014-02-14 IMAGING — CR DG CHEST 1V PORT
1 series · 1 of 1 positions shown · non-contrast
Comparison: [DATE]

CLINICAL DATA: Chest pain

EXAM:
PORTABLE CHEST - 1 VIEW

[portable]
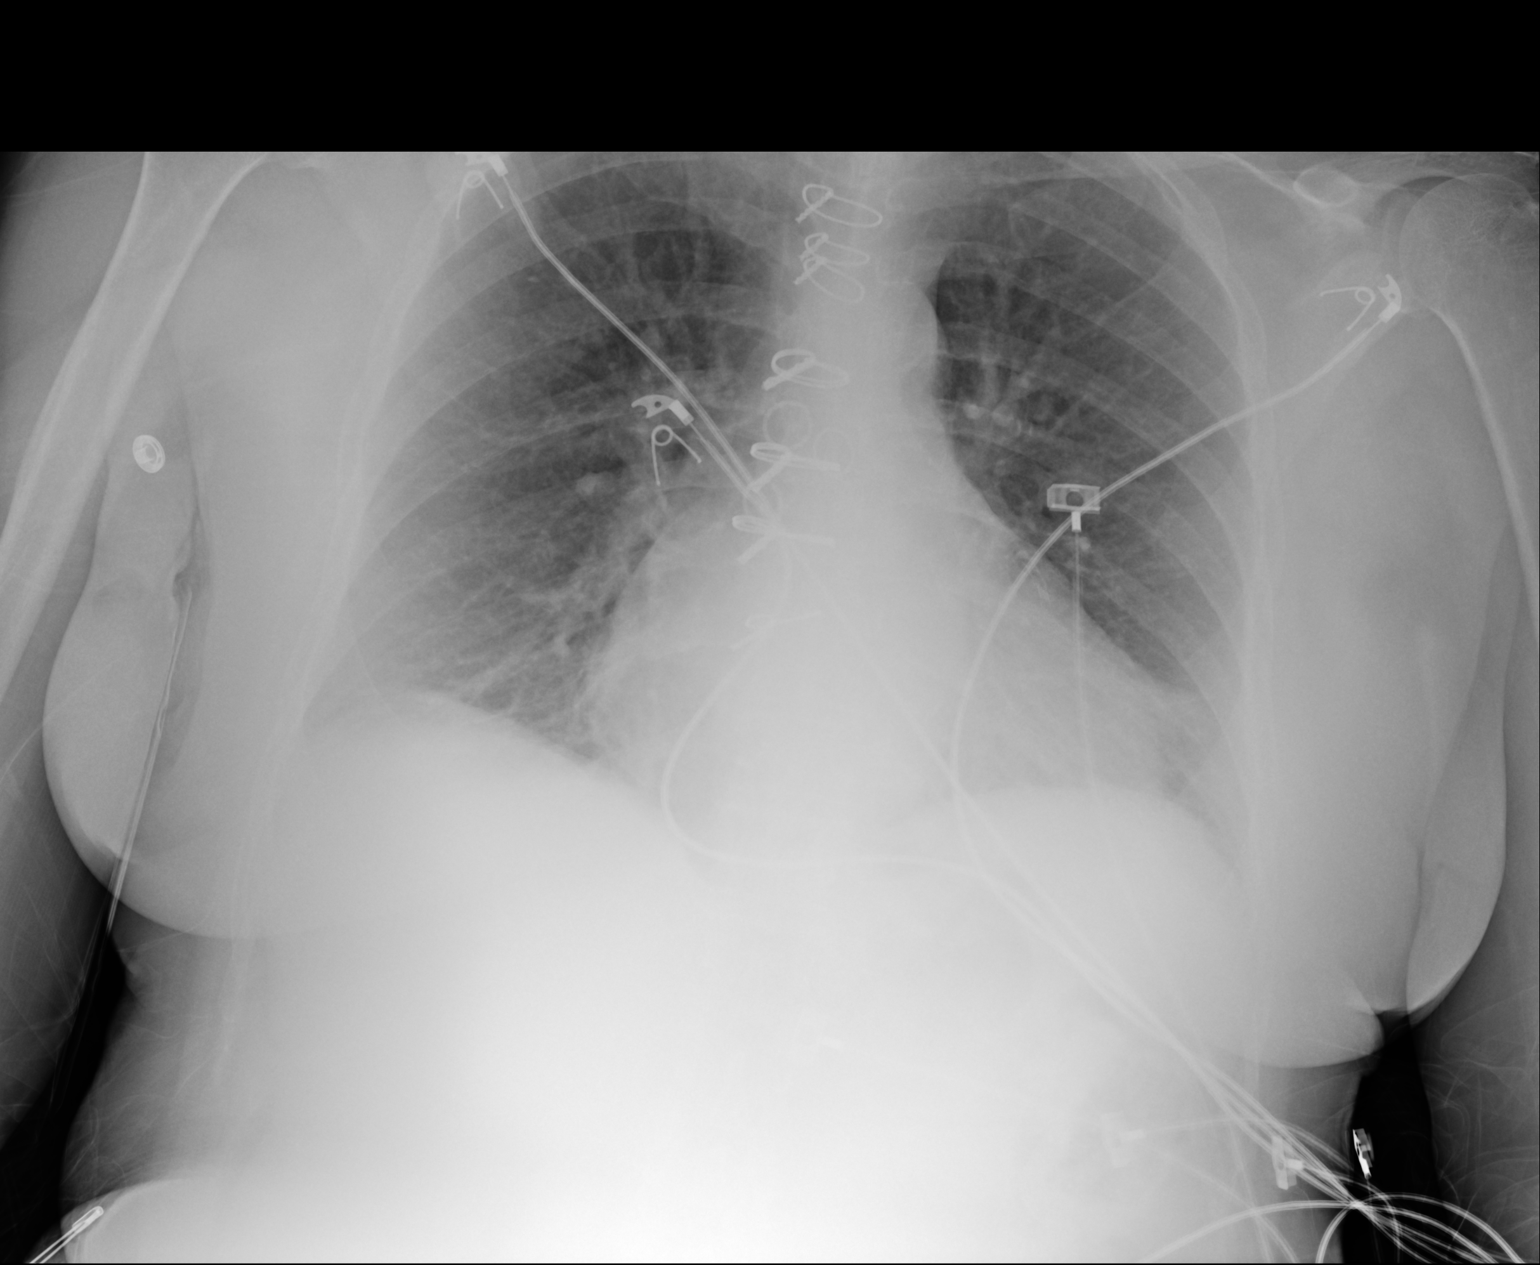

[1 of 1 positions shown; findings below may reference images not displayed]

FINDINGS: Borderline cardiomegaly, stable from previous. The patient is status
post CABG. There is interstitial prominence which is unchanged from
priors. Chronic scar at the lingula. There is no edema,
consolidation, effusion, or pneumothorax.
IMPRESSION: Stable exam.  No evidence of active disease.

## 2014-02-14 MED ORDER — NITROGLYCERIN 0.4 MG SL SUBL
0.4000 mg | SUBLINGUAL_TABLET | Freq: Once | SUBLINGUAL | Status: AC
  Administered 2014-02-14: 0.4 mg via SUBLINGUAL
  Filled 2014-02-14: qty 1

## 2014-02-14 MED ORDER — TRAMADOL HCL 50 MG PO TABS: 50.0000 mg | ORAL_TABLET | Freq: Four times a day (QID) | ORAL | Status: DC | PRN

## 2014-02-14 MED ORDER — ASPIRIN 81 MG PO CHEW
162.0000 mg | CHEWABLE_TABLET | Freq: Once | ORAL | Status: AC
  Administered 2014-02-14: 162 mg via ORAL
  Filled 2014-02-14: qty 2

## 2014-02-14 NOTE — ED Provider Notes (Signed)
CSN: [id number]     Arrival date & time 02/14/14  [id number] History   First MD Initiated Contact with Patient 02/14/14 0242     Chief Complaint  Patient presents with  . Chest Pain     (Consider location/radiation/quality/duration/timing/severity/associated sxs/prior Treatment) HPI Comments: 73 year old female with a history of end-stage renal disease on dialysis Monday Wednesday and Friday, hypertension, coronary artery disease status post stenting and coronary artery bypass grafting. She had a catheterization in June showing that she had patent grafts and no obvious obstructions. It was recommended that she have medical therapy for any recurrent chest pain. She presents again today stating that over the last 24 hours she has had a constant heaviness and tightness on her chest, seemed to get worse when she lay down last night and has been persistent throughout the evening. It is not associated with shortness of breath, coughing, fever, chills, nausea or vomiting. She denies any swelling of her lower extremities. She describes the pain as a tightness and a soreness and feels as though this sensation moves, initially in her left breast, now across her chest.  She also has the same feeling in her back - this gets worse when she moves from side to side or twists / bends at the waist.  Patient is a 73 y.o. female presenting with chest pain. The history is provided by the patient and medical records.  Chest Pain   Past Medical History  Diagnosis Date  . ESRD on hemodialysis     ESRD due to HTN, started dialysis 2011 and gets HD at [facility] with Dr Hinda Lenis on MWF schedule.  Access is LUA AVF as of Sept 2014.   Marland Kitchen Chronic bronchitis   . GERD (gastroesophageal reflux disease)   . PUD (peptic ulcer disease)   . History of lower GI bleeding   . Arthritis   . History of gout   . CAD (coronary artery disease)     a. 12/2011 NSTEMI/Cath/PCI LCX (2.25x14 Resolute DES) & D1 (2.25x22 Resolute DES);  b.  01/2012 Cath/PCI: LM 30, LAD 30p, 40-23m, D1 stent ok, 99 in sm branch of diag, LCX patent stent, OM1 20, RCA 95 ost (4.0x12 Promus DES), EF 55%;  c. 04/2012 Lexi Cardiolite  EF 48%, small area of scar @ base/mid inflat wall with mild peri-infarct ischemia.; CABG 12/4  . High cholesterol 12/2011  . Pneumonia ~ 2009  . Iron deficiency anemia   . TIA (transient ischemic attack)   . Anxiety   . History of blood transfusion 07/2011; 12/2011; 01/2012 X 2; 04/2012  . Carotid artery disease     a. [id number] LICA, [id number]   . Mitral regurgitation     a. Moderate by echo, 02/2012  . Myocardial infarction   . Ovarian cancer 1992  . Colon cancer 1992  . Chronic diastolic CHF (congestive heart failure)     a. 02/2012 Echo EF 60-65%, nl wall motion, Gr 1 DD, mod MR  . Hypertension   . AVM (arteriovenous malformation) of colon   . Esophageal stricture    Past Surgical History  Procedure Laterality Date  . Abdominal hysterectomy  1992  . Appendectomy  06/1990  . Tubal ligation  1980's  . Av fistula placement  07/2009    left upper arm  . Thrombectomy / arteriovenous graft revision  2011    left upper arm  . Colon resection  1992  . Esophagogastroduodenoscopy  01/20/2012    Procedure: ESOPHAGOGASTRODUODENOSCOPY (EGD);  Surgeon: Pricilla Riffle  Fuller Plan, MD,FACG;  Location: [facility] ENDOSCOPY;  Service: Endoscopy;  Laterality: N/A;  . Dilation and curettage of uterus    . Coronary angioplasty with stent placement  12/15/11    "2"  . Coronary angioplasty with stent placement  y/2013    "1; makes total of 3" (05/02/2012)  . Coronary artery bypass graft  06/13/2012    Procedure: CORONARY ARTERY BYPASS GRAFTING (CABG);  Surgeon: Grace Isaac, MD;  Location: [place];  Service: Open Heart Surgery;  Laterality: N/A;  cabg x four;  using left internal mammary artery, and left leg greater saphenous vein harvested endoscopically  . Intraoperative transesophageal echocardiogram  06/13/2012    Procedure: INTRAOPERATIVE  TRANSESOPHAGEAL ECHOCARDIOGRAM;  Surgeon: Grace Isaac, MD;  Location: [place];  Service: Open Heart Surgery;  Laterality: N/A;  . Esophagogastroduodenoscopy N/A 03/26/2013    Procedure: ESOPHAGOGASTRODUODENOSCOPY (EGD);  Surgeon: Irene Shipper, MD;  Location: [facility] ENDOSCOPY;  Service: Endoscopy;  Laterality: N/A;  . Ovary surgery      ovarian cancer   Family History  Problem Relation Age of Onset  . Other      noncontributory for early CAD  . Heart disease Mother     Heart Disease before age 35  . Hyperlipidemia Mother   . Hypertension Mother   . Diabetes Mother   . Heart attack Mother   . Heart disease Father     Heart Disease before age 25  . Hyperlipidemia Father   . Hypertension Father   . Diabetes Father   . Diabetes Sister   . Diabetes Brother   . Hyperlipidemia Brother   . Colon cancer Neg Hx   . Esophageal cancer Neg Hx   . Liver disease Neg Hx   . Kidney disease Neg Hx   . Colon polyps Neg Hx    History  Substance Use Topics  . Smoking status: Never Smoker   . Smokeless tobacco: Never Used  . Alcohol Use: No   OB History   Grav Para Term Preterm Abortions TAB SAB Ect Mult Living                 Review of Systems  Cardiovascular: Positive for chest pain.  All other systems reviewed and are negative.     Allergies  Aspirin; Contrast media; Iron; Macrodantin; Penicillins; Plavix; Bactrim; Sulfa antibiotics; Venofer; Dexilant; Prilosec; Levaquin; and Protonix  Home Medications   Prior to Admission medications   Medication Sig Start Date End Date Taking? Authorizing Provider  albuterol (PROVENTIL HFA;VENTOLIN HFA) 108 (90 BASE) MCG/ACT inhaler Inhale 2 puffs into the lungs every 6 (six) hours as needed for wheezing or shortness of breath.     Historical Provider, MD  ALPRAZolam Duanne Moron) 0.5 MG tablet Take 0.5 mg by mouth 3 (three) times daily.    Historical Provider, MD  amLODipine (NORVASC) 10 MG tablet Take 1 tablet (10 mg total) by mouth daily. 12/17/13    Lendon Colonel, NP  aspirin EC 81 MG tablet Take 81 mg by mouth daily.    Historical Provider, MD  cloNIDine (CATAPRES) 0.2 MG tablet Take 1 tablet (0.2 mg total) by mouth 2 (two) times daily. 12/23/13   Fay Records, MD  dicyclomine (BENTYL) 10 MG capsule Take 1 capsule (10 mg total) by mouth 3 (three) times daily before meals. 02/10/14   Ladene Artist, MD  fluconazole (DIFLUCAN) 150 MG tablet Take 1 tablet (150 mg total) by mouth daily. 01/16/14   Lendon Colonel, NP  fluticasone (FLONASE) 50 MCG/ACT nasal spray Place 2 sprays into the nose daily as needed for allergies.     Historical Provider, MD  folic acid-vitamin b complex-vitamin c-selenium-zinc (DIALYVITE) 3 MG TABS Take 1 tablet by mouth daily.    Historical Provider, MD  isosorbide dinitrate (ISORDIL) 10 MG tablet Take 1 tablet (10 mg total) by mouth 3 (three) times daily. 01/16/14   Lendon Colonel, NP  labetalol (NORMODYNE) 100 MG tablet Take 1.5 tablets (150 mg total) by mouth 2 (two) times daily. 11/13/13   Herminio Commons, MD  lanthanum (FOSRENOL) 1000 MG chewable tablet Chew 1,000 mg by mouth 3 (three) times daily after meals.    Historical Provider, MD  loratadine (CLARITIN) 10 MG tablet Take 10 mg by mouth daily.     Historical Provider, MD  losartan (COZAAR) 100 MG tablet Take 100 mg by mouth daily.    Historical Provider, MD  nitroGLYCERIN (NITROSTAT) 0.4 MG SL tablet Place 0.4 mg under the tongue every 5 (five) minutes as needed for chest pain. 10/04/12   Donney Dice, PA-C  omeprazole (PRILOSEC) 20 MG capsule Take 1 capsule (20 mg total) by mouth daily. 02/10/14   Ladene Artist, MD  PROCTOSOL HC 2.5 % rectal cream  12/09/13   Historical Provider, MD  SENSIPAR 30 MG tablet Take 30 mg by mouth at bedtime.  12/21/12   Historical Provider, MD  simvastatin (ZOCOR) 20 MG tablet Take 1 tablet (20 mg total) by mouth at bedtime. 09/20/13   Herminio Commons, MD  tetrahydrozoline 0.05 % ophthalmic solution Place 1 drop into both  eyes 2 (two) times daily as needed (irritation).    Historical Provider, MD  traMADol (ULTRAM) 50 MG tablet Take 1 tablet (50 mg total) by mouth every 6 (six) hours as needed. 02/14/14   Johnna Acosta, MD  zolpidem (AMBIEN) 10 MG tablet Take 10 mg by mouth at bedtime as needed for sleep.  05/27/13   Historical Provider, MD   BP 158/75  Pulse 79  Temp(Src) 98.2 F (36.8 C) (Oral)  Resp 15  Ht 5\' 1"  (1.549 m)  Wt 144 lb (65.318 kg)  BMI 27.22 kg/m2  SpO2 96% Physical Exam  Nursing note and vitals reviewed. Constitutional: She appears well-developed and well-nourished. No distress.  HENT:  Head: Normocephalic and atraumatic.  Mouth/Throat: Oropharynx is clear and moist. No oropharyngeal exudate.  Eyes: Conjunctivae and EOM are normal. Pupils are equal, round, and reactive to light. Right eye exhibits no discharge. Left eye exhibits no discharge. No scleral icterus.  Neck: Normal range of motion. Neck supple. No JVD present. No thyromegaly present.  Cardiovascular: Normal rate, regular rhythm, normal heart sounds and intact distal pulses.  Exam reveals no gallop and no friction rub.   No murmur heard. Pulmonary/Chest: Effort normal and breath sounds normal. No respiratory distress. She has no wheezes. She has no rales. She exhibits tenderness (reproducible chest tenderness with palpation across the precordium, left and right chest, patient states is the same pain).  Abdominal: Soft. Bowel sounds are normal. She exhibits no distension and no mass. There is no tenderness.  Musculoskeletal: Normal range of motion. She exhibits no edema and no tenderness.  Lymphadenopathy:    She has no cervical adenopathy.  Neurological: She is alert. Coordination normal.  Skin: Skin is warm and dry. No rash noted. No erythema.  Psychiatric: She has a normal mood and affect. Her behavior is normal.    ED Course  Procedures (including  critical care time) Labs Review Labs Reviewed  CBC WITH DIFFERENTIAL -  Abnormal; Notable for the following:    RBC 3.70 (*)    Hemoglobin 11.9 (*)    RDW 16.0 (*)    Eosinophils Relative 9 (*)    All other components within normal limits  COMPREHENSIVE METABOLIC PANEL - Abnormal; Notable for the following:    Potassium 5.5 (*)    Chloride 93 (*)    Glucose, Bld 124 (*)    BUN 49 (*)    Creatinine, Ser 9.27 (*)    GFR calc non Af Amer 4 (*)    GFR calc Af Amer 4 (*)    All other components within normal limits  TROPONIN I    Imaging Review Dg Chest Portable 1 View  02/14/2014   CLINICAL DATA:  Chest pain  EXAM: PORTABLE CHEST - 1 VIEW  COMPARISON:  11/26/2013  FINDINGS: Borderline cardiomegaly, stable from previous. The patient is status post CABG. There is interstitial prominence which is unchanged from priors. Chronic scar at the lingula. There is no edema, consolidation, effusion, or pneumothorax.  IMPRESSION: Stable exam.  No evidence of active disease.   Electronically Signed   By: Jorje Guild M.D.   On: 02/14/2014 03:53     EKG Interpretation   Date/Time:  Friday February 14 2014 02:48:11 EDT Ventricular Rate:  84 PR Interval:  187 QRS Duration: 97 QT Interval:  409 QTC Calculation: 483 R Axis:   28 Text Interpretation:  Sinus rhythm Anterior infarct, old Non-specific ST-t  changes Abnormal ekg since last tracing no significant change Confirmed by  Danna Casella  MD, Junie Engram ([id number]) on 02/14/2014 3:06:03 AM      MDM   Final diagnoses:  Other chest pain  Hyperkalemia    The patient does not appear to be in distress, her EKG while not normal is unchanged since her last tracing. Lab work pending, vital signs show mild hypertension but given the patient's dialysis status and requiring multiple medications this is not unusual, aspirin given prior to arrival, 162 mg. Will complete dosing and have nitroglycerin, chest x-ray.  A portable Chest X-Ray was ordered. My reading of this film is no acute findings - no ptx, no pna, no pulmonary edema.  Labs  WNL given ESRD - dialysis this AM - stable for d/c.    The patient has improved, stable for discharge, expresses understanding to the indications for return.  Johnna Acosta, MD 02/14/14 [id number]

## 2014-02-14 NOTE — ED Notes (Signed)
Pt states the pain is better in chest, but is still there when she moves.

## 2014-02-14 NOTE — ED Notes (Signed)
Pt c/o breast tightness and chest soreness since last night. Pt states last dialysis tx was yesterday.

## 2014-03-20 ENCOUNTER — Other Ambulatory Visit: Payer: Self-pay | Admitting: Cardiovascular Disease

## 2014-03-20 ENCOUNTER — Other Ambulatory Visit: Payer: Self-pay

## 2014-04-08 ENCOUNTER — Telehealth: Payer: Self-pay | Admitting: *Deleted

## 2014-04-08 NOTE — Telephone Encounter (Signed)
Patient c/o pain in area of cath site (groin).  Had cath done 12/10/2013.  Stated that hurting & feeling funny up into her stomach occasionally.  Also, nausea off/on.  Advised her to see PMD as this is not likely related to cath at this point.  Explained that sometimes when you have UTI, you can have nausea & uncomfortable feeling in lower abdomen.  Advised to see primary for evaluation.  Patient verbalized understanding.

## 2014-04-24 ENCOUNTER — Encounter (HOSPITAL_COMMUNITY): Payer: Self-pay | Admitting: Emergency Medicine

## 2014-04-24 ENCOUNTER — Emergency Department (HOSPITAL_COMMUNITY): Payer: Medicare Other

## 2014-04-24 ENCOUNTER — Emergency Department (HOSPITAL_COMMUNITY)
Admission: EM | Admit: 2014-04-24 | Discharge: 2014-04-24 | Disposition: A | Discharge status: Home / Self Care | Payer: Medicare Other | Attending: Emergency Medicine | Admitting: Emergency Medicine

## 2014-04-24 DIAGNOSIS — F419 Anxiety disorder, unspecified: Secondary | ICD-10-CM | POA: Insufficient documentation

## 2014-04-24 DIAGNOSIS — I5032 Chronic diastolic (congestive) heart failure: Secondary | ICD-10-CM | POA: Insufficient documentation

## 2014-04-24 DIAGNOSIS — I2510 Atherosclerotic heart disease of native coronary artery without angina pectoris: Secondary | ICD-10-CM | POA: Diagnosis not present

## 2014-04-24 DIAGNOSIS — Z90710 Acquired absence of both cervix and uterus: Secondary | ICD-10-CM | POA: Insufficient documentation

## 2014-04-24 DIAGNOSIS — Z992 Dependence on renal dialysis: Secondary | ICD-10-CM | POA: Diagnosis not present

## 2014-04-24 DIAGNOSIS — R197 Diarrhea, unspecified: Secondary | ICD-10-CM | POA: Insufficient documentation

## 2014-04-24 DIAGNOSIS — Z8701 Personal history of pneumonia (recurrent): Secondary | ICD-10-CM | POA: Insufficient documentation

## 2014-04-24 DIAGNOSIS — I120 Hypertensive chronic kidney disease with stage 5 chronic kidney disease or end stage renal disease: Secondary | ICD-10-CM | POA: Diagnosis not present

## 2014-04-24 DIAGNOSIS — Z951 Presence of aortocoronary bypass graft: Secondary | ICD-10-CM | POA: Diagnosis not present

## 2014-04-24 DIAGNOSIS — Z85038 Personal history of other malignant neoplasm of large intestine: Secondary | ICD-10-CM | POA: Diagnosis not present

## 2014-04-24 DIAGNOSIS — K219 Gastro-esophageal reflux disease without esophagitis: Secondary | ICD-10-CM | POA: Insufficient documentation

## 2014-04-24 DIAGNOSIS — M1990 Unspecified osteoarthritis, unspecified site: Secondary | ICD-10-CM | POA: Diagnosis not present

## 2014-04-24 DIAGNOSIS — Z8673 Personal history of transient ischemic attack (TIA), and cerebral infarction without residual deficits: Secondary | ICD-10-CM | POA: Diagnosis not present

## 2014-04-24 DIAGNOSIS — Q2733 Arteriovenous malformation of digestive system vessel: Secondary | ICD-10-CM | POA: Diagnosis not present

## 2014-04-24 DIAGNOSIS — Z9889 Other specified postprocedural states: Secondary | ICD-10-CM | POA: Insufficient documentation

## 2014-04-24 DIAGNOSIS — Z8543 Personal history of malignant neoplasm of ovary: Secondary | ICD-10-CM | POA: Diagnosis not present

## 2014-04-24 DIAGNOSIS — Z9861 Coronary angioplasty status: Secondary | ICD-10-CM | POA: Insufficient documentation

## 2014-04-24 DIAGNOSIS — Z880 Allergy status to penicillin: Secondary | ICD-10-CM | POA: Insufficient documentation

## 2014-04-24 DIAGNOSIS — Z8709 Personal history of other diseases of the respiratory system: Secondary | ICD-10-CM | POA: Insufficient documentation

## 2014-04-24 DIAGNOSIS — I252 Old myocardial infarction: Secondary | ICD-10-CM | POA: Diagnosis not present

## 2014-04-24 DIAGNOSIS — Z79899 Other long term (current) drug therapy: Secondary | ICD-10-CM | POA: Insufficient documentation

## 2014-04-24 DIAGNOSIS — N186 End stage renal disease: Secondary | ICD-10-CM | POA: Diagnosis not present

## 2014-04-24 DIAGNOSIS — Z9851 Tubal ligation status: Secondary | ICD-10-CM | POA: Diagnosis not present

## 2014-04-24 DIAGNOSIS — D509 Iron deficiency anemia, unspecified: Secondary | ICD-10-CM | POA: Diagnosis not present

## 2014-04-24 DIAGNOSIS — Z7951 Long term (current) use of inhaled steroids: Secondary | ICD-10-CM | POA: Insufficient documentation

## 2014-04-24 DIAGNOSIS — R112 Nausea with vomiting, unspecified: Secondary | ICD-10-CM

## 2014-04-24 DIAGNOSIS — Z7982 Long term (current) use of aspirin: Secondary | ICD-10-CM | POA: Diagnosis not present

## 2014-04-24 DIAGNOSIS — R109 Unspecified abdominal pain: Secondary | ICD-10-CM | POA: Diagnosis present

## 2014-04-24 LAB — TROPONIN I
Troponin I: <0.3 ng/mL (ref <0.30)
Troponin I: <0.3 ng/mL (ref <0.30)

## 2014-04-24 LAB — CBC WITH DIFFERENTIAL/PLATELET
Basophils Absolute: 0.1 10*3/uL (ref 0.0–0.1)
Basophils Relative: 2 % — ABNORMAL HIGH (ref 0–1)
EOS ABS: 0.5 10*3/uL (ref 0.0–0.7)
Eosinophils Relative: 8 % — ABNORMAL HIGH (ref 0–5)
HEMATOCRIT: 40.5 % (ref 36.0–46.0)
Hemoglobin: 13 g/dL (ref 12.0–15.0)
Lymphocytes Relative: 19 % (ref 12–46)
Lymphs Abs: 1.3 10*3/uL (ref 0.7–4.0)
MCH: 31.3 pg (ref 26.0–34.0)
MCHC: 32.1 g/dL (ref 30.0–36.0)
MCV: 97.6 fL (ref 78.0–100.0)
MONO ABS: 0.4 10*3/uL (ref 0.1–1.0)
Monocytes Relative: 7 % (ref 3–12)
Neutro Abs: 4.3 10*3/uL (ref 1.7–7.7)
Neutrophils Relative %: 64 % (ref 43–77)
PLATELETS: 241 10*3/uL (ref 150–400)
RBC: 4.15 MIL/uL (ref 3.87–5.11)
RDW: 15.5 % (ref 11.5–15.5)
WBC: 6.7 10*3/uL (ref 4.0–10.5)

## 2014-04-24 LAB — COMPREHENSIVE METABOLIC PANEL
ALT: 14 U/L (ref 0–35)
AST: 15 U/L (ref 0–37)
Albumin: 4.2 g/dL (ref 3.5–5.2)
Alkaline Phosphatase: 85 U/L (ref 39–117)
Anion gap: 19 — ABNORMAL HIGH (ref 5–15)
BUN: 39 mg/dL — ABNORMAL HIGH (ref 6–23)
CALCIUM: 10.3 mg/dL (ref 8.4–10.5)
CO2: 26 mEq/L (ref 19–32)
Chloride: 93 mEq/L — ABNORMAL LOW (ref 96–112)
Creatinine, Ser: 8.86 mg/dL — ABNORMAL HIGH (ref 0.50–1.10)
GFR calc non Af Amer: 4 mL/min — ABNORMAL LOW (ref >90)
GFR, EST AFRICAN AMERICAN: 4 mL/min — AB (ref >90)
Glucose, Bld: 87 mg/dL (ref 70–99)
Potassium: 5.1 mEq/L (ref 3.7–5.3)
SODIUM: 138 meq/L (ref 137–147)
TOTAL PROTEIN: 7.9 g/dL (ref 6.0–8.3)
Total Bilirubin: 0.4 mg/dL (ref 0.3–1.2)

## 2014-04-24 LAB — LIPASE, BLOOD: LIPASE: 59 U/L (ref 11–59)

## 2014-04-24 LAB — I-STAT CG4 LACTIC ACID, ED: Lactic Acid, Venous: 1.2 mmol/L (ref 0.5–2.2)

## 2014-04-24 IMAGING — CT CT ABD-PELV W/O CM
2 of 4 series · 16 of 46 positions shown, 18 images · non-contrast
Comparison: [DATE]

CLINICAL DATA: Upper abdominal pain. Nausea. Diarrhea. End-stage
renal disease on dialysis.

EXAM:
CT ABDOMEN AND PELVIS WITHOUT CONTRAST
TECHNIQUE: Multidetector CT imaging of the abdomen and pelvis was performed
following the standard protocol without IV contrast.

[Series 2: abdomen/pelvis w/o contrast · axial · non-contrast · 0.66mm/px · z∈[-430,-65]mm · 13 of 81 slices shown, 15 images]
[im 4/81  soft-tissue]
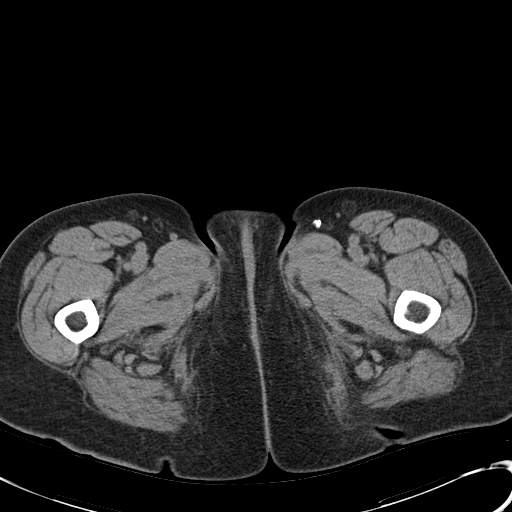
[im 4/81  bone]
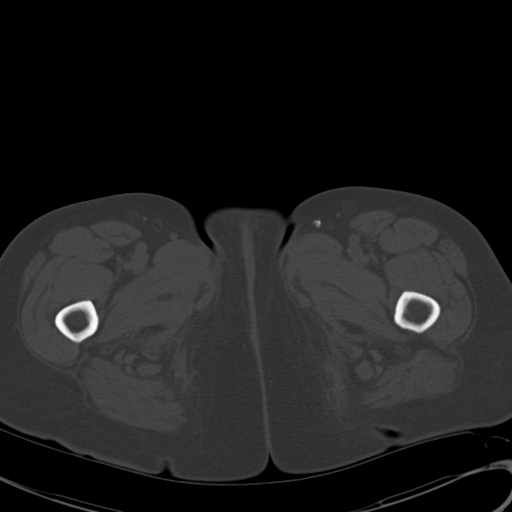
[im 10/81  soft-tissue]
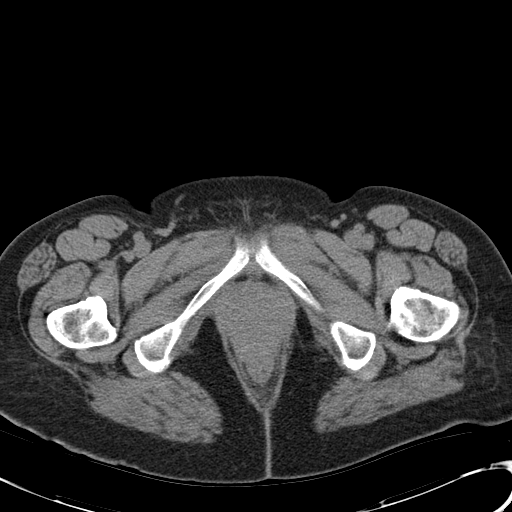
[im 17/81  soft-tissue]
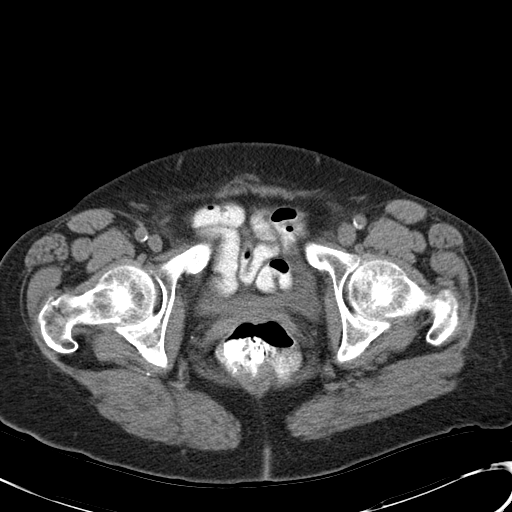
[im 23/81  soft-tissue]
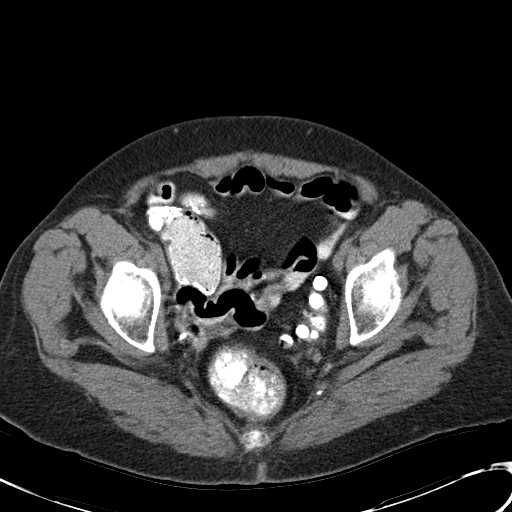
[im 29/81  soft-tissue]
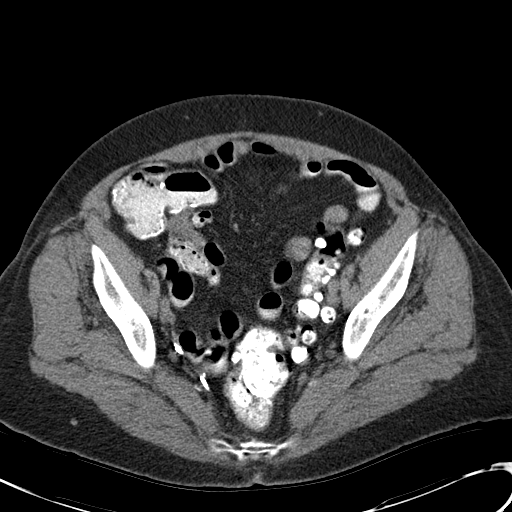
[im 36/81  soft-tissue]
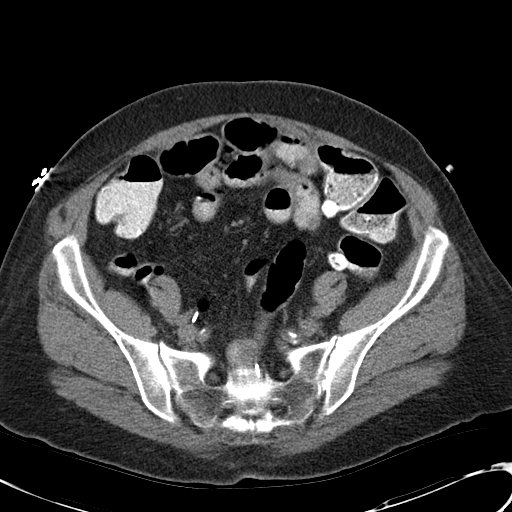
[im 42/81  soft-tissue]
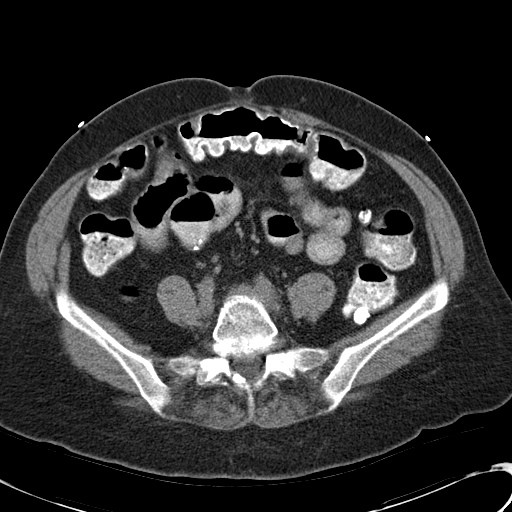
[im 45/81  soft-tissue]
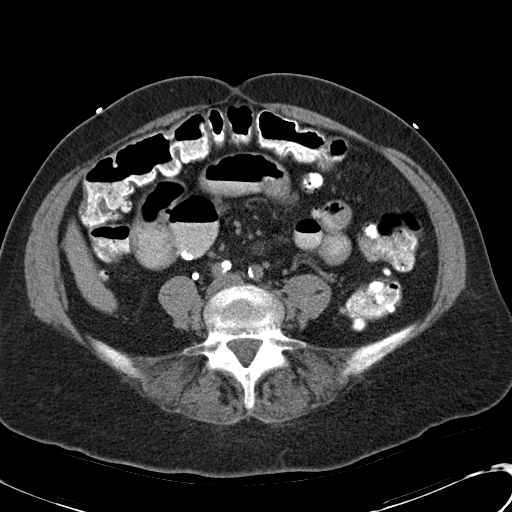
[im 52/81  soft-tissue]
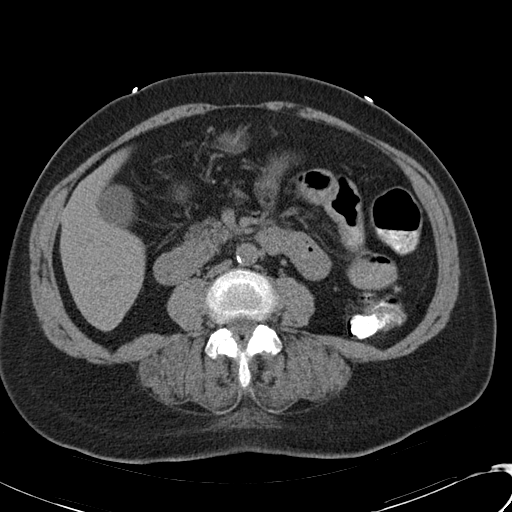
[im 52/81  bone]
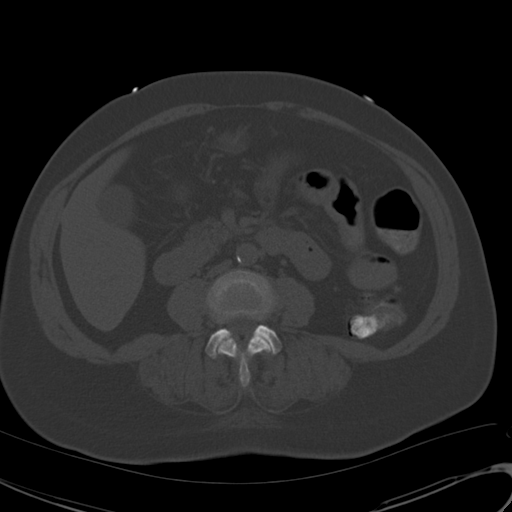
[im 58/81  soft-tissue]
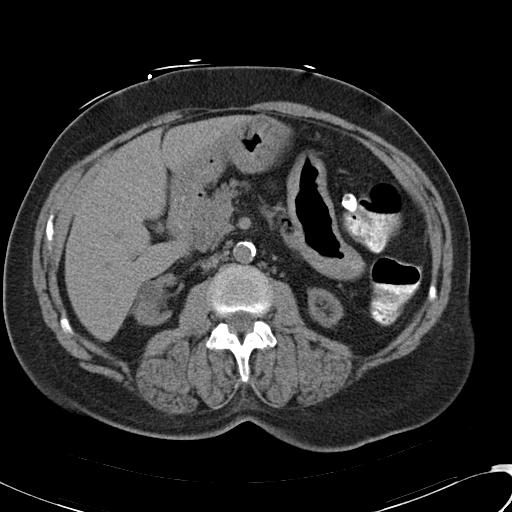
[im 65/81  soft-tissue]
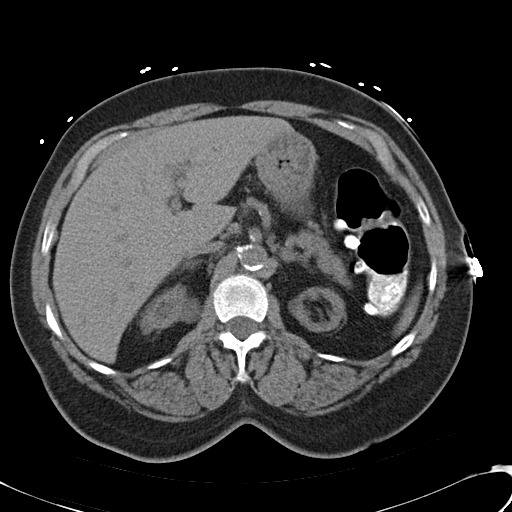
[im 71/81  soft-tissue]
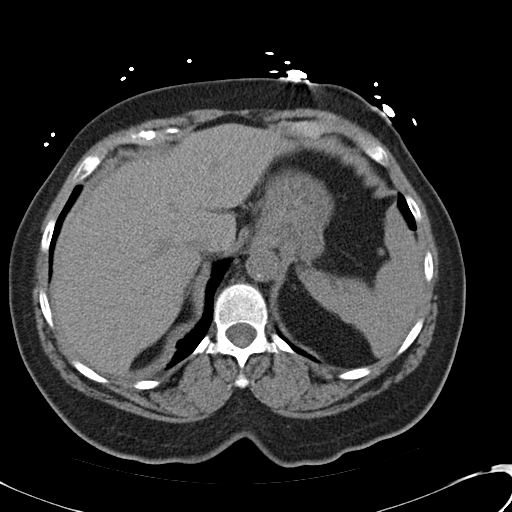
[im 77/81  soft-tissue]
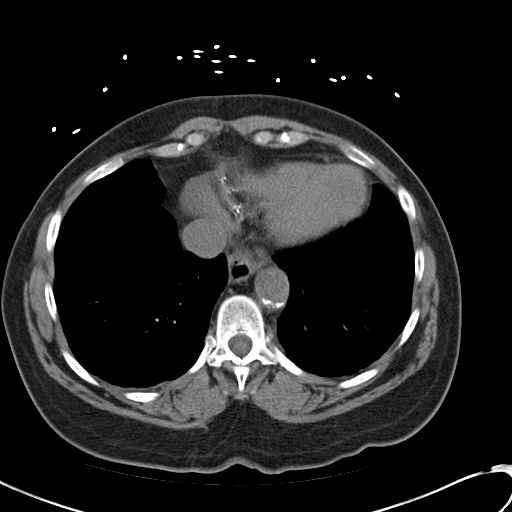

[Series 4: mpr cor (id) · coronal · 0.67mm/px · 3 of 92 slices shown]
[im 31/92  soft-tissue]
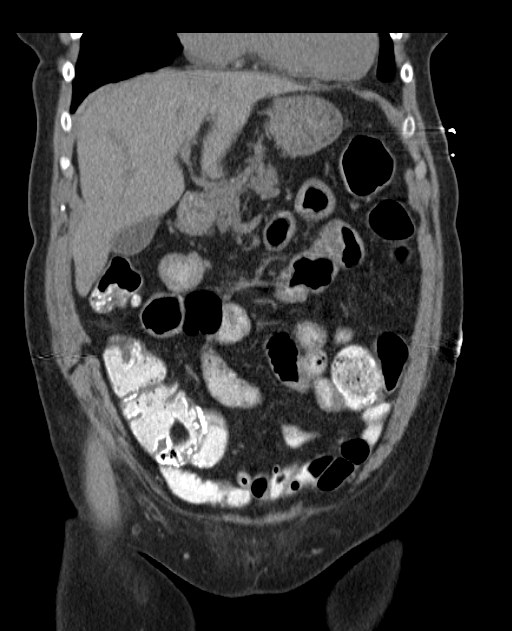
[im 41/92  soft-tissue]
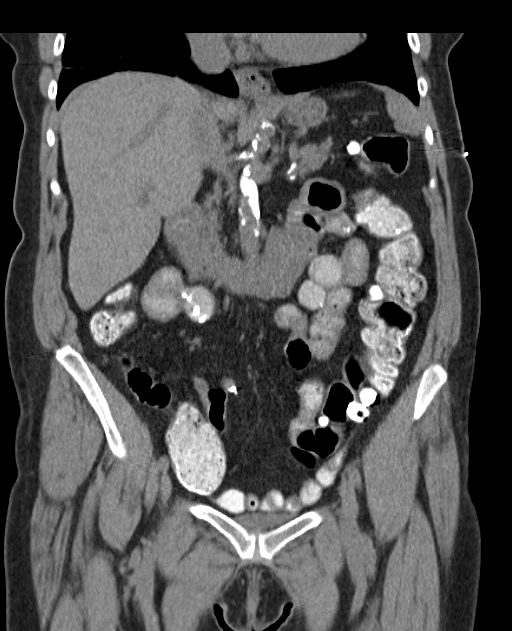
[im 51/92  soft-tissue]
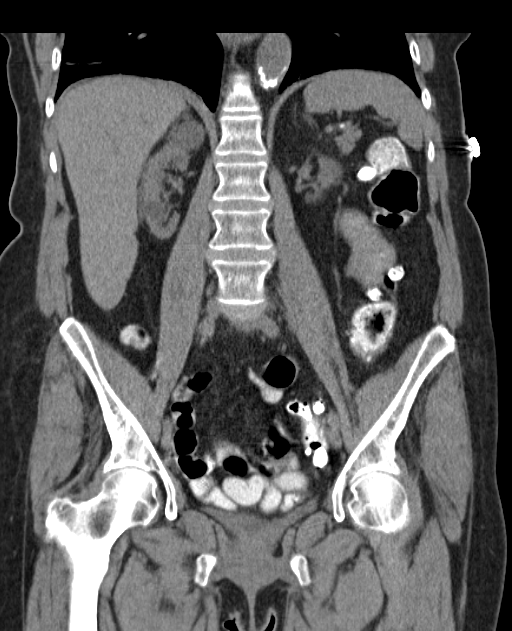

[16 of 46 positions shown; findings below may reference images not displayed]

FINDINGS: Lower chest:  Unremarkable.

Hepatobiliary: No mass or other abnormality visualized on this
non-contrast exam. Gallbladder is unremarkable

Pancreas: No mass or inflammatory process visualized on this
non-contrast exam.

Spleen:  Within normal limits in size.

Adrenal Glands:  No mass identified.

Kidneys/Urinary tract: No evidence of urolithiasis or
hydronephrosis. Bilateral diffuse renal parenchymal atrophy and
cysts appears stable.

Stomach/Bowel/Peritoneum: Diverticulosis is again demonstrated with
greatest involvement in the sigmoid colon. No evidence of
diverticulitis

Vascular/Lymphatic: No pathologically enlarged lymph nodes
identified. No other significant abnormality identified.

Reproductive: Prior hysterectomy. Adnexal regions are unremarkable.

Other:  None.

Musculoskeletal:  No suspicious bone lesions identified.
IMPRESSION: No acute findings.

Diverticulosis. No radiographic evidence of diverticulitis.

Bilateral renal parenchymal atrophy consistent with end-stage renal
disease. No evidence hydronephrosis.

## 2014-04-24 MED ORDER — CLONIDINE HCL 0.2 MG PO TABS: 0.2000 mg | ORAL_TABLET | Freq: Two times a day (BID) | ORAL | Status: DC

## 2014-04-24 MED ORDER — AMLODIPINE BESYLATE 5 MG PO TABS
10.0000 mg | ORAL_TABLET | Freq: Every day | ORAL | Status: DC
  Administered 2014-04-24: 10 mg via ORAL
  Filled 2014-04-24: qty 2

## 2014-04-24 MED ORDER — CLONIDINE HCL 0.2 MG PO TABS
0.2000 mg | ORAL_TABLET | Freq: Once | ORAL | Status: AC
  Administered 2014-04-24: 0.2 mg via ORAL
  Filled 2014-04-24: qty 1

## 2014-04-24 MED ORDER — FLUCONAZOLE 150 MG PO TABS: 150.0000 mg | ORAL_TABLET | Freq: Every day | ORAL | Status: AC

## 2014-04-24 MED ORDER — ONDANSETRON HCL 4 MG PO TABS: 4.0000 mg | ORAL_TABLET | Freq: Four times a day (QID) | ORAL | Status: DC

## 2014-04-24 MED ORDER — GI COCKTAIL ~~LOC~~
30.0000 mL | Freq: Once | ORAL | Status: AC
  Administered 2014-04-24: 30 mL via ORAL
  Filled 2014-04-24: qty 30

## 2014-04-24 NOTE — ED Provider Notes (Signed)
CSN: [id number]     Arrival date & time 04/24/14  1254 History  This chart was scribed for Ezequiel Essex, MD by Rayfield Citizen, ED Scribe. This patient was seen in room APA14/APA14 and the patient's care was started at 2:30 PM.     Chief Complaint  Patient presents with  . Abdominal Pain   The history is provided by the patient. No language interpreter was used.    HPI Comments: Shawna [name] is a 73 y.o. female who presents to the Emergency Department complaining of 5 days of intermittent abdominal pain and associated nausea and diarrhea (3x), worsening last night. She reports prior experience with similar symptoms but not to this level of severity; the nausea and burning sensation in her chest kept her awake last night. She denies any vomiting or blood in her stool. She denies fevers or chest or back pain.    She was on Ceftin last week for a sinus infection; she notes associated congestion from what she believes is her lingering sinus infection. She notes postnasal drip. Patient reports slight cough.   She notes lower abdominal pain and slight vaginal discharge from a recent yeast infection.   She is on dialysis (4 years); her last treatment was yesterday. She makes urine "every now and then." She had a previous surgery on her stomach; she does not know if she has a gallbladder, but she does not have an appendix, uterus, or ovaries. She has a history of GERD, PUD (though she is unsure of this), lower GI bleeding, esophageal stricture and gout. She has a history of colon and ovarian cancer.    Past Medical History  Diagnosis Date  . Chronic bronchitis   . GERD (gastroesophageal reflux disease)   . PUD (peptic ulcer disease)   . History of lower GI bleeding   . Arthritis   . History of gout   . CAD (coronary artery disease)     a. 12/2011 NSTEMI/Cath/PCI LCX (2.25x14 Resolute DES) & D1 (2.25x22 Resolute DES);  b. 01/2012 Cath/PCI: LM 30, LAD 30p, 40-50m, D1 stent ok, 99 in sm branch of  diag, LCX patent stent, OM1 20, RCA 95 ost (4.0x12 Promus DES), EF 55%;  c. 04/2012 Lexi Cardiolite  EF 48%, small area of scar @ base/mid inflat wall with mild peri-infarct ischemia.; CABG 12/4  . High cholesterol 12/2011  . Pneumonia ~ 2009  . Iron deficiency anemia   . TIA (transient ischemic attack)   . Anxiety   . History of blood transfusion 07/2011; 12/2011; 01/2012 X 2; 04/2012  . Carotid artery disease     a. [id number] LICA, [id number]   . Mitral regurgitation     a. Moderate by echo, 02/2012  . Myocardial infarction   . Chronic diastolic CHF (congestive heart failure)     a. 02/2012 Echo EF 60-65%, nl wall motion, Gr 1 DD, mod MR  . Hypertension   . AVM (arteriovenous malformation) of colon   . Esophageal stricture   . Ovarian cancer 1992  . Colon cancer 1992  . ESRD on hemodialysis     ESRD due to HTN, started dialysis 2011 and gets HD at [facility] with Dr Hinda Lenis on MWF schedule.  Access is LUA AVF as of Sept 2014.    Past Surgical History  Procedure Laterality Date  . Abdominal hysterectomy  1992  . Appendectomy  06/1990  . Tubal ligation  1980's  . Av fistula placement  07/2009    left upper arm  .  Thrombectomy / arteriovenous graft revision  2011    left upper arm  . Colon resection  1992  . Esophagogastroduodenoscopy  01/20/2012    Procedure: ESOPHAGOGASTRODUODENOSCOPY (EGD);  Surgeon: Ladene Artist, MD,FACG;  Location: [facility] ENDOSCOPY;  Service: Endoscopy;  Laterality: N/A;  . Dilation and curettage of uterus    . Coronary angioplasty with stent placement  12/15/11    "2"  . Coronary angioplasty with stent placement  y/2013    "1; makes total of 3" (05/02/2012)  . Coronary artery bypass graft  06/13/2012    Procedure: CORONARY ARTERY BYPASS GRAFTING (CABG);  Surgeon: Grace Isaac, MD;  Location: [place];  Service: Open Heart Surgery;  Laterality: N/A;  cabg x four;  using left internal mammary artery, and left leg greater saphenous vein harvested endoscopically  .  Intraoperative transesophageal echocardiogram  06/13/2012    Procedure: INTRAOPERATIVE TRANSESOPHAGEAL ECHOCARDIOGRAM;  Surgeon: Grace Isaac, MD;  Location: [place];  Service: Open Heart Surgery;  Laterality: N/A;  . Esophagogastroduodenoscopy N/A 03/26/2013    Procedure: ESOPHAGOGASTRODUODENOSCOPY (EGD);  Surgeon: Irene Shipper, MD;  Location: [facility] ENDOSCOPY;  Service: Endoscopy;  Laterality: N/A;  . Ovary surgery      ovarian cancer  . Cardiac surgery     Family History  Problem Relation Age of Onset  . Other      noncontributory for early CAD  . Heart disease Mother     Heart Disease before age 63  . Hyperlipidemia Mother   . Hypertension Mother   . Diabetes Mother   . Heart attack Mother   . Heart disease Father     Heart Disease before age 57  . Hyperlipidemia Father   . Hypertension Father   . Diabetes Father   . Diabetes Sister   . Diabetes Brother   . Hyperlipidemia Brother   . Colon cancer Neg Hx   . Esophageal cancer Neg Hx   . Liver disease Neg Hx   . Kidney disease Neg Hx   . Colon polyps Neg Hx    History  Substance Use Topics  . Smoking status: Never Smoker   . Smokeless tobacco: Never Used  . Alcohol Use: No   OB History   Grav Para Term Preterm Abortions TAB SAB Ect Mult Living                 Review of Systems  Gastrointestinal: Positive for abdominal pain.   A complete 10 system review of systems was obtained and all systems are negative except as noted in the HPI and PMH.   Allergies  Aspirin; Contrast media; Iron; Macrodantin; Penicillins; Plavix; Bactrim; Sulfa antibiotics; Venofer; Dexilant; Prilosec; Levaquin; and Protonix  Home Medications   Prior to Admission medications   Medication Sig Start Date End Date Taking? Authorizing Provider  ALPRAZolam Duanne Moron) 0.5 MG tablet Take 0.5 mg by mouth 3 (three) times daily.   Yes Historical Provider, MD  amLODipine (NORVASC) 10 MG tablet Take 1 tablet (10 mg total) by mouth daily. 12/17/13  Yes  Lendon Colonel, NP  aspirin EC 81 MG tablet Take 81 mg by mouth daily.   Yes Historical Provider, MD  cloNIDine (CATAPRES) 0.2 MG tablet Take 1 tablet (0.2 mg total) by mouth 2 (two) times daily. 12/23/13  Yes Fay Records, MD  fluticasone (FLONASE) 50 MCG/ACT nasal spray Place 2 sprays into the nose daily as needed for allergies.    Yes Historical Provider, MD  folic acid-vitamin b complex-vitamin c-selenium-zinc (  DIALYVITE) 3 MG TABS Take 1 tablet by mouth daily.   Yes Historical Provider, MD  isosorbide dinitrate (ISORDIL) 10 MG tablet Take 1 tablet (10 mg total) by mouth 3 (three) times daily. 01/16/14  Yes Lendon Colonel, NP  labetalol (NORMODYNE) 100 MG tablet Take 1.5 tablets (150 mg total) by mouth 2 (two) times daily. 11/13/13  Yes Herminio Commons, MD  lanthanum (FOSRENOL) 1000 MG chewable tablet Chew 1,000 mg by mouth 3 (three) times daily after meals.   Yes Historical Provider, MD  loratadine (CLARITIN) 10 MG tablet Take 10 mg by mouth daily.    Yes Historical Provider, MD  nitroGLYCERIN (NITROSTAT) 0.4 MG SL tablet Place 0.4 mg under the tongue every 5 (five) minutes as needed for chest pain. 10/04/12  Yes Donney Dice, PA-C  omeprazole (PRILOSEC) 20 MG capsule Take 1 capsule (20 mg total) by mouth daily. 02/10/14  Yes Ladene Artist, MD  SENSIPAR 30 MG tablet Take 30 mg by mouth at bedtime.  12/21/12  Yes Historical Provider, MD  simvastatin (ZOCOR) 20 MG tablet Take 1 tablet by mouth daily. 03/13/14  Yes Historical Provider, MD  tetrahydrozoline 0.05 % ophthalmic solution Place 1 drop into both eyes 2 (two) times daily as needed (irritation).   Yes Historical Provider, MD  zolpidem (AMBIEN) 10 MG tablet Take 10 mg by mouth at bedtime as needed for sleep.  05/27/13  Yes Historical Provider, MD  fluconazole (DIFLUCAN) 150 MG tablet Take 1 tablet (150 mg total) by mouth daily. 04/24/14 05/01/14  Ezequiel Essex, MD  ondansetron (ZOFRAN) 4 MG tablet Take 1 tablet (4 mg total) by mouth  every 6 (six) hours. 04/24/14   Ezequiel Essex, MD   BP 166/74  Pulse 67  Temp(Src) 98.2 F (36.8 C) (Oral)  Resp 16  SpO2 99% Physical Exam  Nursing note and vitals reviewed. Constitutional: She is oriented to person, place, and time. She appears well-developed and well-nourished. No distress.  HENT:  Head: Normocephalic and atraumatic.  Mouth/Throat: Oropharynx is clear and moist. No oropharyngeal exudate.  Eyes: Conjunctivae and EOM are normal. Pupils are equal, round, and reactive to light.  Neck: Normal range of motion. Neck supple.  No meningismus.  Cardiovascular: Normal rate, regular rhythm, normal heart sounds and intact distal pulses.   No murmur heard. Pulmonary/Chest: Effort normal and breath sounds normal. No respiratory distress.  Abdominal: Soft. There is no tenderness. There is no rebound and no guarding.  Distended; tender epigastrium and periumbilical  Genitourinary:  No hemorrhoids, no gross blood, chaperone present  Musculoskeletal: Normal range of motion. She exhibits no edema and no tenderness.  Neurological: She is alert and oriented to person, place, and time. No cranial nerve deficit. She exhibits normal muscle tone. Coordination normal.  No ataxia on finger to nose bilaterally. No pronator drift. 5/5 strength throughout. CN 2-12 intact. Negative Romberg. Equal grip strength. Sensation intact. Gait is normal.   Skin: Skin is warm.  Psychiatric: She has a normal mood and affect. Her behavior is normal.    ED Course  Procedures   DIAGNOSTIC STUDIES: Oxygen Saturation is 99% on RA, normal by my interpretation.    COORDINATION OF CARE: 2:38 PM Discussed treatment plan with pt at bedside and pt agreed to plan.   Labs Review Labs Reviewed  CBC WITH DIFFERENTIAL - Abnormal; Notable for the following:    Eosinophils Relative 8 (*)    Basophils Relative 2 (*)    All other components within normal limits  COMPREHENSIVE METABOLIC PANEL - Abnormal; Notable  for the following:    Chloride 93 (*)    BUN 39 (*)    Creatinine, Ser 8.86 (*)    GFR calc non Af Amer 4 (*)    GFR calc Af Amer 4 (*)    Anion gap 19 (*)    All other components within normal limits  CLOSTRIDIUM DIFFICILE BY PCR  TROPONIN I  LIPASE, BLOOD  TROPONIN I  POC OCCULT BLOOD, ED  I-STAT CG4 LACTIC ACID, ED    Imaging Review Ct Abdomen Pelvis Wo Contrast  04/24/2014   CLINICAL DATA:  Upper abdominal pain. Nausea. Diarrhea. End-stage renal disease on dialysis.  EXAM: CT ABDOMEN AND PELVIS WITHOUT CONTRAST  TECHNIQUE: Multidetector CT imaging of the abdomen and pelvis was performed following the standard protocol without IV contrast.  COMPARISON:  12/05/2013  FINDINGS: Lower chest:  Unremarkable.  Hepatobiliary: No mass or other abnormality visualized on this non-contrast exam. Gallbladder is unremarkable  Pancreas: No mass or inflammatory process visualized on this non-contrast exam.  Spleen:  Within normal limits in size.  Adrenal Glands:  No mass identified.  Kidneys/Urinary tract: No evidence of urolithiasis or hydronephrosis. Bilateral diffuse renal parenchymal atrophy and cysts appears stable.  Stomach/Bowel/Peritoneum: Diverticulosis is again demonstrated with greatest involvement in the sigmoid colon. No evidence of diverticulitis  Vascular/Lymphatic: No pathologically enlarged lymph nodes identified. No other significant abnormality identified.  Reproductive: Prior hysterectomy. Adnexal regions are unremarkable.  Other:  None.  Musculoskeletal:  No suspicious bone lesions identified.  IMPRESSION: No acute findings.  Diverticulosis. No radiographic evidence of diverticulitis.  Bilateral renal parenchymal atrophy consistent with end-stage renal disease. No evidence hydronephrosis.   Electronically Signed   By: Earle Gell M.D.   On: 04/24/2014 17:02     EKG Interpretation   Date/Time:  Thursday April 24 2014 14:50:17 EDT Ventricular Rate:  73 PR Interval:  162 QRS  Duration: 96 QT Interval:  416 QTC Calculation: 458 R Axis:   46 Text Interpretation:  Sinus rhythm Probable LVH with secondary repol abnrm  Anterior ST elevation, probably due to LVH No significant change was found  Confirmed by Wyvonnia Dusky  MD, [place] [phone]) on 04/24/2014 2:58:05 PM      MDM   Final diagnoses:  Nausea vomiting and diarrhea   5 day history of intermittent abdominal pain with nausea and diarrhea. No fever or chills. No chest pain. endorses burning sensation that radiates in the epigastrium. Denies any blood in the stool.  History of AV malformation on last EGD. Patent grafts on LHC in June 2015.   CT scan without acute findings. Troponin negative x2. EKG unchanged. Hemoglobin stable. Fecal occult blood negative.  Patient feels improved in the ED. She's tolerating by mouth. No vomiting. Blood pressure improved after she received her home medications.  This pain is atypical and not similar to her previous episodes of chest pain. C dif collected and sent in setting of recent antibiotic use.  Pending at discharge. Follow up with PCP this week. Return precautions discussed. BP 166/74  Pulse 67  Temp(Src) 98.2 F (36.8 C) (Oral)  Resp 16  SpO2 99%    I personally performed the services described in this documentation, which was scribed in my presence. The recorded information has been reviewed and is accurate.    Ezequiel Essex, MD 04/24/14 [phone]

## 2014-04-24 NOTE — ED Notes (Signed)
abd pain, nausea, diarrhea. No fever or chills.dialysis pt

## 2014-04-24 NOTE — ED Notes (Signed)
Pt tolerating water po

## 2014-04-24 NOTE — Discharge Instructions (Signed)
Nausea and Vomiting Take a nausea medicine as prescribed. Continue her Prilosec. Followup with her Dr. this week. Return to the ED if you develop new or worsening symptoms. Nausea is a sick feeling that often comes before throwing up (vomiting). Vomiting is a reflex where stomach contents come out of your mouth. Vomiting can cause severe loss of body fluids (dehydration). Children and elderly adults can become dehydrated quickly, especially if they also have diarrhea. Nausea and vomiting are symptoms of a condition or disease. It is important to find the cause of your symptoms. CAUSES   Direct irritation of the stomach lining. This irritation can result from increased acid production (gastroesophageal reflux disease), infection, food poisoning, taking certain medicines (such as nonsteroidal anti-inflammatory drugs), alcohol use, or tobacco use.  Signals from the brain.These signals could be caused by a headache, heat exposure, an inner ear disturbance, increased pressure in the brain from injury, infection, a tumor, or a concussion, pain, emotional stimulus, or metabolic problems.  An obstruction in the gastrointestinal tract (bowel obstruction).  Illnesses such as diabetes, hepatitis, gallbladder problems, appendicitis, kidney problems, cancer, sepsis, atypical symptoms of a heart attack, or eating disorders.  Medical treatments such as chemotherapy and radiation.  Receiving medicine that makes you sleep (general anesthetic) during surgery. DIAGNOSIS Your caregiver may ask for tests to be done if the problems do not improve after a few days. Tests may also be done if symptoms are severe or if the reason for the nausea and vomiting is not clear. Tests may include:  Urine tests.  Blood tests.  Stool tests.  Cultures (to look for evidence of infection).  X-rays or other imaging studies. Test results can help your caregiver make decisions about treatment or the need for additional  tests. TREATMENT You need to stay well hydrated. Drink frequently but in small amounts.You may wish to drink water, sports drinks, clear broth, or eat frozen ice pops or gelatin dessert to help stay hydrated.When you eat, eating slowly may help prevent nausea.There are also some antinausea medicines that may help prevent nausea. HOME CARE INSTRUCTIONS   Take all medicine as directed by your caregiver.  If you do not have an appetite, do not force yourself to eat. However, you must continue to drink fluids.  If you have an appetite, eat a normal diet unless your caregiver tells you differently.  Eat a variety of complex carbohydrates (rice, wheat, potatoes, bread), lean meats, yogurt, fruits, and vegetables.  Avoid high-fat foods because they are more difficult to digest.  Drink enough water and fluids to keep your urine clear or pale yellow.  If you are dehydrated, ask your caregiver for specific rehydration instructions. Signs of dehydration may include:  Severe thirst.  Dry lips and mouth.  Dizziness.  Dark urine.  Decreasing urine frequency and amount.  Confusion.  Rapid breathing or pulse. SEEK IMMEDIATE MEDICAL CARE IF:   You have blood or brown flecks (like coffee grounds) in your vomit.  You have black or bloody stools.  You have a severe headache or stiff neck.  You are confused.  You have severe abdominal pain.  You have chest pain or trouble breathing.  You do not urinate at least once every 8 hours.  You develop cold or clammy skin.  You continue to vomit for longer than 24 to 48 hours.  You have a fever. MAKE SURE YOU:   Understand these instructions.  Will watch your condition.  Will get help right away if you are  not doing well or get worse. Document Released: 06/27/2005 Document Revised: 09/19/2011 Document Reviewed: 11/24/2010 [facility] Patient Information 2015 [place], [place]. This information is not intended to replace advice given to  you by your health care provider. Make sure you discuss any questions you have with your health care provider.

## 2014-04-25 LAB — POC OCCULT BLOOD, ED: Fecal Occult Bld: NEGATIVE

## 2014-05-29 ENCOUNTER — Other Ambulatory Visit: Payer: Self-pay | Admitting: Cardiovascular Disease

## 2014-06-12 ENCOUNTER — Other Ambulatory Visit: Payer: Self-pay | Admitting: Cardiovascular Disease

## 2014-06-19 ENCOUNTER — Encounter (HOSPITAL_COMMUNITY): Payer: Self-pay | Admitting: Cardiovascular Disease

## 2014-06-23 ENCOUNTER — Other Ambulatory Visit: Payer: Self-pay | Admitting: *Deleted

## 2014-06-23 MED ORDER — NITROGLYCERIN 0.4 MG SL SUBL: 0.4000 mg | SUBLINGUAL_TABLET | SUBLINGUAL | Status: DC | PRN

## 2014-08-13 ENCOUNTER — Encounter: Payer: Self-pay | Admitting: Family

## 2014-08-14 ENCOUNTER — Ambulatory Visit (INDEPENDENT_AMBULATORY_CARE_PROVIDER_SITE_OTHER): Payer: 59 | Admitting: Family

## 2014-08-14 ENCOUNTER — Encounter: Payer: Self-pay | Admitting: Family

## 2014-08-14 ENCOUNTER — Ambulatory Visit (HOSPITAL_COMMUNITY)
Admission: RE | Admit: 2014-08-14 | Discharge: 2014-08-14 | Disposition: A | Discharge status: Home / Self Care | Payer: Medicare Other | Source: Ambulatory Visit | Attending: Family | Admitting: Family

## 2014-08-14 ENCOUNTER — Ambulatory Visit (INDEPENDENT_AMBULATORY_CARE_PROVIDER_SITE_OTHER)
Admission: RE | Admit: 2014-08-14 | Discharge: 2014-08-14 | Disposition: A | Discharge status: Home / Self Care | Payer: Medicare Other | Source: Ambulatory Visit | Attending: Family | Admitting: Family

## 2014-08-14 VITALS — BP 193/83 | HR 71 | Resp 16 | Ht 61.0 in | Wt 149.0 lb

## 2014-08-14 DIAGNOSIS — M25569 Pain in unspecified knee: Secondary | ICD-10-CM | POA: Insufficient documentation

## 2014-08-14 DIAGNOSIS — I6523 Occlusion and stenosis of bilateral carotid arteries: Secondary | ICD-10-CM

## 2014-08-14 DIAGNOSIS — N186 End stage renal disease: Secondary | ICD-10-CM

## 2014-08-14 DIAGNOSIS — I739 Peripheral vascular disease, unspecified: Secondary | ICD-10-CM

## 2014-08-14 DIAGNOSIS — I770 Arteriovenous fistula, acquired: Secondary | ICD-10-CM

## 2014-08-14 NOTE — Progress Notes (Signed)
[facility] [place] HISTORY AND PHYSICAL   MRN : [id number]  History of Present Illness:   Shawna [name] is a 74 y.o. female patient of Dr. Oneida Alar who has been monitored for bilateral minimal internal carotid artery stenosis and PAD. She was initially referred by her PCP for auscultated neck bruit. She returns today for surveillance. She has not had carotid artery intervention nor LE intervention.  She has had a left AV fistula placement in her upper arm in 2011and dialyzes M-W-F. She is hypertensive today, states she took her medications today, was hypertensive at her last visit, states her blood pressure remains high despite the four antihypertensive medications she takes. She has an appointment to see her cardiologist soon re her uncontrolled hypertension. She had a CABG, 2 vessel, Dec. 2013. Pt denies claudication symptoms, denies non healing wounds. Pt states she had a TIA about 2009 as manifested by "my mouth was twisted" and transient expressive aphasia, denies any change in vision, denies hemiparesis, denies dysphagia.  Pt Diabetic: No Pt smoker: non-smoker  Pt meds include: Statin : yes ASA: Yes Other anticoagulants/antiplatelets: no    Current Outpatient Prescriptions  Medication Sig Dispense Refill  . ALPRAZolam (XANAX) 0.5 MG tablet Take 0.5 mg by mouth 3 (three) times daily.    Marland Kitchen amLODipine (NORVASC) 10 MG tablet Take 1 tablet (10 mg total) by mouth daily. 30 tablet 6  . aspirin EC 81 MG tablet Take 81 mg by mouth daily.    . cloNIDine (CATAPRES) 0.2 MG tablet Take 1 tablet (0.2 mg total) by mouth 2 (two) times daily. 60 tablet 11  . fluticasone (FLONASE) 50 MCG/ACT nasal spray Place 2 sprays into the nose daily as needed for allergies.     . folic acid-vitamin b complex-vitamin c-selenium-zinc (DIALYVITE) 3 MG TABS Take 1 tablet by mouth daily.    . isosorbide dinitrate (ISORDIL) 10 MG tablet Take 1 tablet (10 mg total) by mouth 3 (three) times  daily. 90 tablet 10  . labetalol (NORMODYNE) 100 MG tablet Take 1.5 tablets (150 mg total) by mouth 2 (two) times daily. 120 tablet 6  . lanthanum (FOSRENOL) 1000 MG chewable tablet Chew 1,000 mg by mouth 3 (three) times daily after meals.    Marland Kitchen loratadine (CLARITIN) 10 MG tablet Take 10 mg by mouth daily.     . nitroGLYCERIN (NITROSTAT) 0.4 MG SL tablet Place 1 tablet (0.4 mg total) under the tongue every 5 (five) minutes x 3 doses as needed for chest pain. 25 tablet 3  . omeprazole (PRILOSEC) 20 MG capsule Take 1 capsule (20 mg total) by mouth daily. 30 capsule 11  . ondansetron (ZOFRAN) 4 MG tablet Take 1 tablet (4 mg total) by mouth every 6 (six) hours. 12 tablet 0  . SENSIPAR 30 MG tablet Take 30 mg by mouth at bedtime.     . simvastatin (ZOCOR) 20 MG tablet Take 1 tablet by mouth daily.    Marland Kitchen tetrahydrozoline 0.05 % ophthalmic solution Place 1 drop into both eyes 2 (two) times daily as needed (irritation).    Marland Kitchen zolpidem (AMBIEN) 10 MG tablet Take 10 mg by mouth at bedtime as needed for sleep.      No current facility-administered medications for this visit.    Past Medical History  Diagnosis Date  . Chronic bronchitis   . GERD (gastroesophageal reflux disease)   . PUD (peptic ulcer disease)   . History of lower GI bleeding   . Arthritis   .  History of gout   . CAD (coronary artery disease)     a. 12/2011 NSTEMI/Cath/PCI LCX (2.25x14 Resolute DES) & D1 (2.25x22 Resolute DES);  b. 01/2012 Cath/PCI: LM 30, LAD 30p, 40-93m, D1 stent ok, 99 in sm branch of diag, LCX patent stent, OM1 20, RCA 95 ost (4.0x12 Promus DES), EF 55%;  c. 04/2012 Lexi Cardiolite  EF 48%, small area of scar @ base/mid inflat wall with mild peri-infarct ischemia.; CABG 12/4  . High cholesterol 12/2011  . Pneumonia ~ 2009  . Iron deficiency anemia   . TIA (transient ischemic attack)   . Anxiety   . History of blood transfusion 07/2011; 12/2011; 01/2012 X 2; 04/2012  . Carotid artery disease     a. [id number] LICA, [id number]    . Mitral regurgitation     a. Moderate by echo, 02/2012  . Myocardial infarction   . Chronic diastolic CHF (congestive heart failure)     a. 02/2012 Echo EF 60-65%, nl wall motion, Gr 1 DD, mod MR  . Hypertension   . AVM (arteriovenous malformation) of colon   . Esophageal stricture   . Ovarian cancer 1992  . Colon cancer 1992  . ESRD on hemodialysis     ESRD due to HTN, started dialysis 2011 and gets HD at [facility] with Dr Hinda Lenis on MWF schedule.  Access is LUA AVF as of Sept 2014.     Social History History  Substance Use Topics  . Smoking status: Never Smoker   . Smokeless tobacco: Never Used  . Alcohol Use: No    Family History Family History  Problem Relation Age of Onset  . Other      noncontributory for early CAD  . Heart disease Mother     Heart Disease before age 49  . Hyperlipidemia Mother   . Hypertension Mother   . Diabetes Mother   . Heart attack Mother   . Heart disease Father     Heart Disease before age 54  . Hyperlipidemia Father   . Hypertension Father   . Diabetes Father   . Diabetes Sister   . Hypertension Sister   . Diabetes Brother   . Hyperlipidemia Brother   . Heart attack Brother   . Colon cancer Neg Hx   . Esophageal cancer Neg Hx   . Liver disease Neg Hx   . Kidney disease Neg Hx   . Colon polyps Neg Hx   . Hypertension Sister   . Heart attack Brother     Surgical History Past Surgical History  Procedure Laterality Date  . Abdominal hysterectomy  1992  . Appendectomy  06/1990  . Tubal ligation  1980's  . Av fistula placement  07/2009    left upper arm  . Thrombectomy / arteriovenous graft revision  2011    left upper arm  . Colon resection  1992  . Esophagogastroduodenoscopy  01/20/2012    Procedure: ESOPHAGOGASTRODUODENOSCOPY (EGD);  Surgeon: Ladene Artist, MD,FACG;  Location: [facility] ENDOSCOPY;  Service: Endoscopy;  Laterality: N/A;  . Dilation and curettage of uterus    . Coronary angioplasty with stent placement  12/15/11     "2"  . Coronary angioplasty with stent placement  y/2013    "1; makes total of 3" (05/02/2012)  . Coronary artery bypass graft  06/13/2012    Procedure: CORONARY ARTERY BYPASS GRAFTING (CABG);  Surgeon: Grace Isaac, MD;  Location: [place];  Service: Open Heart Surgery;  Laterality: N/A;  cabg x four;  using left internal mammary artery, and left leg greater saphenous vein harvested endoscopically  . Intraoperative transesophageal echocardiogram  06/13/2012    Procedure: INTRAOPERATIVE TRANSESOPHAGEAL ECHOCARDIOGRAM;  Surgeon: Grace Isaac, MD;  Location: [place];  Service: Open Heart Surgery;  Laterality: N/A;  . Esophagogastroduodenoscopy N/A 03/26/2013    Procedure: ESOPHAGOGASTRODUODENOSCOPY (EGD);  Surgeon: Irene Shipper, MD;  Location: [facility] ENDOSCOPY;  Service: Endoscopy;  Laterality: N/A;  . Ovary surgery      ovarian cancer  . Cardiac surgery    . Left heart catheterization with coronary angiogram N/A 12/15/2011    Procedure: LEFT HEART CATHETERIZATION WITH CORONARY ANGIOGRAM;  Surgeon: Burnell Blanks, MD;  Location: [facility] CATH LAB;  Service: Cardiovascular;  Laterality: N/A;  . Left heart catheterization with coronary angiogram N/A 01/10/2012    Procedure: LEFT HEART CATHETERIZATION WITH CORONARY ANGIOGRAM;  Surgeon: Peter M Martinique, MD;  Location: [facility] CATH LAB;  Service: Cardiovascular;  Laterality: N/A;  . Left heart catheterization with coronary angiogram N/A 06/08/2012    Procedure: LEFT HEART CATHETERIZATION WITH CORONARY ANGIOGRAM;  Surgeon: Burnell Blanks, MD;  Location: [facility] CATH LAB;  Service: Cardiovascular;  Laterality: N/A;  . Shuntogram N/A 10/15/2013    Procedure: Fistulogram;  Surgeon: Serafina Mitchell, MD;  Location: [facility] CATH LAB;  Service: Cardiovascular;  Laterality: N/A;  . Left heart catheterization with coronary/graft angiogram N/A 12/10/2013    Procedure: LEFT HEART CATHETERIZATION WITH Beatrix Fetters;  Surgeon: [name], MD;  Location: [facility]  CATH LAB;  Service: Cardiovascular;  Laterality: N/A;    Allergies  Allergen Reactions  . Aspirin Other (See Comments)    Mess up her stomach; "makes my bowels have blood in them". Takes 81 mg EC Aspirin   . Contrast Media [Iodinated Diagnostic Agents] Itching  . Iron Itching and Other (See Comments)    "they gave me iron in dialysis; had to give me Benadryl cause I had to have the iron" (05/02/2012)  . Macrodantin [Nitrofurantoin Macrocrystal] Other (See Comments)    "broke me out in big old knots all over my body; had to go to ER"  . Penicillins Other (See Comments)    "makes me real weak when I take it; like I'll pass out"  . Plavix [Clopidogrel Bisulfate] Rash  . Bactrim [Sulfamethoxazole-Trimethoprim] Rash  . Sulfa Antibiotics Rash  . Venofer [Ferric Oxide] Itching    Patient reports using Benadryl prior to doses as [facility]  . Dexilant [Dexlansoprazole] Other (See Comments)    Upset stomach  . Prilosec [Omeprazole] Other (See Comments)    "back spasms"  . Levaquin [Levofloxacin In D5w] Rash  . Protonix [Pantoprazole Sodium] Rash    Current Outpatient Prescriptions  Medication Sig Dispense Refill  . ALPRAZolam (XANAX) 0.5 MG tablet Take 0.5 mg by mouth 3 (three) times daily.    Marland Kitchen amLODipine (NORVASC) 10 MG tablet Take 1 tablet (10 mg total) by mouth daily. 30 tablet 6  . aspirin EC 81 MG tablet Take 81 mg by mouth daily.    . cloNIDine (CATAPRES) 0.2 MG tablet Take 1 tablet (0.2 mg total) by mouth 2 (two) times daily. 60 tablet 11  . fluticasone (FLONASE) 50 MCG/ACT nasal spray Place 2 sprays into the nose daily as needed for allergies.     . folic acid-vitamin b complex-vitamin c-selenium-zinc (DIALYVITE) 3 MG TABS Take 1 tablet by mouth daily.    . isosorbide dinitrate (ISORDIL) 10 MG tablet Take 1 tablet (10 mg total) by mouth 3 (  three) times daily. 90 tablet 10  . labetalol (NORMODYNE) 100 MG tablet Take 1.5 tablets (150 mg total) by mouth 2 (two) times daily. 120  tablet 6  . lanthanum (FOSRENOL) 1000 MG chewable tablet Chew 1,000 mg by mouth 3 (three) times daily after meals.    Marland Kitchen loratadine (CLARITIN) 10 MG tablet Take 10 mg by mouth daily.     . nitroGLYCERIN (NITROSTAT) 0.4 MG SL tablet Place 1 tablet (0.4 mg total) under the tongue every 5 (five) minutes x 3 doses as needed for chest pain. 25 tablet 3  . omeprazole (PRILOSEC) 20 MG capsule Take 1 capsule (20 mg total) by mouth daily. 30 capsule 11  . ondansetron (ZOFRAN) 4 MG tablet Take 1 tablet (4 mg total) by mouth every 6 (six) hours. 12 tablet 0  . SENSIPAR 30 MG tablet Take 30 mg by mouth at bedtime.     . simvastatin (ZOCOR) 20 MG tablet Take 1 tablet by mouth daily.    Marland Kitchen tetrahydrozoline 0.05 % ophthalmic solution Place 1 drop into both eyes 2 (two) times daily as needed (irritation).    Marland Kitchen zolpidem (AMBIEN) 10 MG tablet Take 10 mg by mouth at bedtime as needed for sleep.      No current facility-administered medications for this visit.     REVIEW OF SYSTEMS: See HPI for pertinent positives and negatives.  Physical Examination Filed Vitals:   08/14/14 1415  BP: 193/83  Pulse: 71  Resp: 16  Height: 5\' 1"  (1.549 m)  Weight: 149 lb (67.586 kg)  SpO2: 99%   Body mass index is 28.17 kg/(m^2).  General: WDWN in NAD Gait: Normal HENT: WNL Eyes: Pupils equal Pulmonary: normal non-labored breathing , without Rales, rhonchi, wheezing Cardiac: RRR, no detected murmur.  Abdomen: soft, NT, no palpable masses Skin: no rashes, no ulcers; no Gangrene , no cellulitis; no open wounds.   VASCULAR EXAM  Carotid Bruits Left Right   Negative Negative   Palpable thrill in left upper arm AV fistula.  Bilateral radial pulses are palpable.   VASCULAR EXAM: Extremities without ischemic changes  without Gangrene; without open wounds.      LE Pulses LEFT RIGHT       FEMORAL 2+ palpable 2+ palpable   POPLITEAL not palpable  not palpable   POSTERIOR TIBIAL not palpable  not palpable    DORSALIS PEDIS  ANTERIOR TIBIAL faintly palpable  1+palpable     Musculoskeletal: no muscle wasting or atrophy; no peripheral edema Neurologic: A&O X 3; Appropriate Affect ;  SENSATION: normal; MOTOR FUNCTION: 5/5 Symmetric, CN 2-12 intact Speech is fluent/normal    Non-Invasive Vascular Imaging (08/14/2014):   CEREBROVASCULAR DUPLEX EVALUATION    INDICATION: Carotid disease    PREVIOUS INTERVENTION(S): Left arm AV fistula     DUPLEX EXAM:     RIGHT  LEFT  Peak Systolic Velocities (cm/s) End Diastolic Velocities (cm/s) Plaque LOCATION Peak Systolic Velocities (cm/s) End Diastolic Velocities (cm/s) Plaque  106 11  CCA PROXIMAL 128 14   89 13  CCA MID 103 15   68 11 HT CCA DISTAL 87 12 HT  79 5  ECA 90 7   62 14 HT ICA PROXIMAL 133 27 HT  66 17  ICA MID 74 21   68 18  ICA DISTAL 60 13     0.91 ICA / CCA Ratio (PSV) 1.5  Antegrade Vertebral Flow Antegrade  [id number] Brachial Systolic Pressure (mmHg) AVF  Multiphasic (subclavian artery) Brachial Artery Waveforms Monophasic (  subclavian artery)    Plaque Morphology:  HM = Homogeneous, HT = Heterogeneous, CP = Calcific Plaque, SP = Smooth Plaque, IP = Irregular Plaque     ADDITIONAL FINDINGS: No significant stenosis of the bilateral external or common carotid arteries. Waveform of the left subclavian artery which is consistent with a left arm AV fistula.    IMPRESSION: Doppler velocities suggest less than 40% stenoses of the bilateral proximal internal carotid arteries.    Compared to the previous exam:  No significant change noted when compared to the previous exam on 08/08/13.        ABI (Date: 08/14/2014)  R: 0.84 (08/08/13, 0.70), DP:  biphasic, PT: monophasic, TBI: 0.68  L: 0.83 (0.71), DP: biphasic, PT: biphasic, TBI: 0.55   ASSESSMENT:  SHAUNDREA MULKEY is a 74 y.o. female who has not had a TIA since 2009. She has been monitored for bilateral minimal internal carotid artery stenosis and PAD. She has no claudication, no tissue loss, is quite active. Carotid Duplex today reveals minimal bilateral ICA stenoses. ABI's in both legs improved from moderate to mild arterial occlusive disease.  Face to face time with patient was 25 minutes. Over 50% of this time was spent on counseling and coordination of care.    PLAN:   Continue graduated walking program.  Based on today's exam and non-invasive vascular lab results, the patient will follow up in 1 year with the following tests: carotid Duplex and ABI's. I discussed in depth with the patient the nature of atherosclerosis, and emphasized the importance of maximal medical management including strict control of blood pressure, blood glucose, and lipid levels, obtaining regular exercise, and continued cessation of smoking.  The patient is aware that without maximal medical management the underlying atherosclerotic disease process will progress, limiting the benefit of any interventions.  The patient was given information about stroke prevention and what symptoms should prompt the patient to seek immediate medical care.  The patient was given information about PAD including signs, symptoms, treatment, what symptoms should prompt the patient to seek immediate medical care, and risk reduction measures to take. Thank you for allowing [place] to participate in this patient's care.  Clemon Chambers, RN, MSN, FNP-C [facility] Office: [phone]  Clinic MD: Oneida Alar 08/14/2014 2:20 PM

## 2014-08-14 NOTE — Patient Instructions (Signed)
Stroke Prevention Some medical conditions and behaviors are associated with an increased chance of having a stroke. You may prevent a stroke by making healthy choices and managing medical conditions. HOW CAN I REDUCE MY RISK OF HAVING A STROKE?   Stay physically active. Get at least 30 minutes of activity on most or all days.  Do not smoke. It may also be helpful to avoid exposure to secondhand smoke.  Limit alcohol use. Moderate alcohol use is considered to be:  No more than 2 drinks per day for men.  No more than 1 drink per day for nonpregnant women.  Eat healthy foods. This involves:  Eating 5 or more servings of fruits and vegetables a day.  Making dietary changes that address high blood pressure (hypertension), high cholesterol, diabetes, or obesity.  Manage your cholesterol levels.  Making food choices that are high in fiber and low in saturated fat, trans fat, and cholesterol may control cholesterol levels.  Take any prescribed medicines to control cholesterol as directed by your health care provider.  Manage your diabetes.  Controlling your carbohydrate and sugar intake is recommended to manage diabetes.  Take any prescribed medicines to control diabetes as directed by your health care provider.  Control your hypertension.  Making food choices that are low in salt (sodium), saturated fat, trans fat, and cholesterol is recommended to manage hypertension.  Take any prescribed medicines to control hypertension as directed by your health care provider.  Maintain a healthy weight.  Reducing calorie intake and making food choices that are low in sodium, saturated fat, trans fat, and cholesterol are recommended to manage weight.  Stop drug abuse.  Avoid taking birth control pills.  Talk to your health care provider about the risks of taking birth control pills if you are over 35 years old, smoke, get migraines, or have ever had a blood clot.  Get evaluated for sleep  disorders (sleep apnea).  Talk to your health care provider about getting a sleep evaluation if you snore a lot or have excessive sleepiness.  Take medicines only as directed by your health care provider.  For some people, aspirin or blood thinners (anticoagulants) are helpful in reducing the risk of forming abnormal blood clots that can lead to stroke. If you have the irregular heart rhythm of atrial fibrillation, you should be on a blood thinner unless there is a good reason you cannot take them.  Understand all your medicine instructions.  Make sure that other conditions (such as anemia or atherosclerosis) are addressed. SEEK IMMEDIATE MEDICAL CARE IF:   You have sudden weakness or numbness of the face, arm, or leg, especially on one side of the body.  Your face or eyelid droops to one side.  You have sudden confusion.  You have trouble speaking (aphasia) or understanding.  You have sudden trouble seeing in one or both eyes.  You have sudden trouble walking.  You have dizziness.  You have a loss of balance or coordination.  You have a sudden, severe headache with no known cause.  You have new chest pain or an irregular heartbeat. Any of these symptoms may represent a serious problem that is an emergency. Do not wait to see if the symptoms will go away. Get medical help at once. Call your local emergency services (911 in [place] Do not drive yourself to the hospital. Document Released: 08/04/2004 Document Revised: 11/11/2013 Document Reviewed: 12/28/2012 ExitCare Patient Information 2015 [employer]. This information is not intended to replace advice given   to you by your health care provider. Make sure you discuss any questions you have with your health care provider.    Peripheral Vascular Disease Peripheral Vascular Disease (PVD), also called Peripheral Arterial Disease (PAD), is a circulation problem caused by cholesterol (atherosclerotic plaque) deposits in the  arteries. PVD commonly occurs in the lower extremities (legs) but it can occur in other areas of the body, such as your arms. The cholesterol buildup in the arteries reduces blood flow which can cause pain and other serious problems. The presence of PVD can place a person at risk for Coronary Artery Disease (CAD).  CAUSES  Causes of PVD can be many. It is usually associated with more than one risk factor such as:   High Cholesterol.  Smoking.  Diabetes.  Lack of exercise or inactivity.  High blood pressure (hypertension).  Obesity.  Family history. SYMPTOMS   When the lower extremities are affected, patients with PVD may experience:  Leg pain with exertion or physical activity. This is called INTERMITTENT CLAUDICATION. This may present as cramping or numbness with physical activity. The location of the pain is associated with the level of blockage. For example, blockage at the abdominal level (distal abdominal aorta) may result in buttock or hip pain. Lower leg arterial blockage may result in calf pain.  As PVD becomes more severe, pain can develop with less physical activity.  In people with severe PVD, leg pain may occur at rest.  Other PVD signs and symptoms:  Leg numbness or weakness.  Coldness in the affected leg or foot, especially when compared to the other leg.  A change in leg color.  Patients with significant PVD are more prone to ulcers or sores on toes, feet or legs. These may take longer to heal or may reoccur. The ulcers or sores can become infected.  If signs and symptoms of PVD are ignored, gangrene may occur. This can result in the loss of toes or loss of an entire limb.  Not all leg pain is related to PVD. Other medical conditions can cause leg pain such as:  Blood clots (embolism) or Deep Vein Thrombosis.  Inflammation of the blood vessels (vasculitis).  Spinal stenosis. DIAGNOSIS  Diagnosis of PVD can involve several different types of tests. These  can include:  Pulse Volume Recording Method (PVR). This test is simple, painless and does not involve the use of X-rays. PVR involves measuring and comparing the blood pressure in the arms and legs. An ABI (Ankle-Brachial Index) is calculated. The normal ratio of blood pressures is 1. As this number becomes smaller, it indicates more severe disease.  < 0.95 - indicates significant narrowing in one or more leg vessels.  <0.8 - there will usually be pain in the foot, leg or buttock with exercise.  <0.4 - will usually have pain in the legs at rest.  <0.25 - usually indicates limb threatening PVD.  Doppler detection of pulses in the legs. This test is painless and checks to see if you have a pulses in your legs/feet.  A dye or contrast material (a substance that highlights the blood vessels so they show up on x-ray) may be given to help your caregiver better see the arteries for the following tests. The dye is eliminated from your body by the kidney's. Your caregiver may order blood work to check your kidney function and other laboratory values before the following tests are performed:  Magnetic Resonance Angiography (MRA). An MRA is a picture study of the blood   vessels and arteries. The MRA machine uses a large magnet to produce images of the blood vessels.  Computed Tomography Angiography (CTA). A CTA is a specialized x-ray that looks at how the blood flows in your blood vessels. An IV may be inserted into your arm so contrast dye can be injected.  Angiogram. Is a procedure that uses x-rays to look at your blood vessels. This procedure is minimally invasive, meaning a small incision (cut) is made in your groin. A small tube (catheter) is then inserted into the artery of your groin. The catheter is guided to the blood vessel or artery your caregiver wants to examine. Contrast dye is injected into the catheter. X-rays are then taken of the blood vessel or artery. After the images are obtained, the  catheter is taken out. TREATMENT  Treatment of PVD involves many interventions which may include:  Lifestyle changes:  Quitting smoking.  Exercise.  Following a low fat, low cholesterol diet.  Control of diabetes.  Foot care is very important to the PVD patient. Good foot care can help prevent infection.  Medication:  Cholesterol-lowering medicine.  Blood pressure medicine.  Anti-platelet drugs.  Certain medicines may reduce symptoms of Intermittent Claudication.  Interventional/Surgical options:  Angioplasty. An Angioplasty is a procedure that inflates a balloon in the blocked artery. This opens the blocked artery to improve blood flow.  Stent Implant. A wire mesh tube (stent) is placed in the artery. The stent expands and stays in place, allowing the artery to remain open.  Peripheral Bypass Surgery. This is a surgical procedure that reroutes the blood around a blocked artery to help improve blood flow. This type of procedure may be performed if Angioplasty or stent implants are not an option. SEEK IMMEDIATE MEDICAL CARE IF:   You develop pain or numbness in your arms or legs.  Your arm or leg turns cold, becomes blue in color.  You develop redness, warmth, swelling and pain in your arms or legs. MAKE SURE YOU:   Understand these instructions.  Will watch your condition.  Will get help right away if you are not doing well or get worse. Document Released: 08/04/2004 Document Revised: 09/19/2011 Document Reviewed: 07/01/2008 ExitCare Patient Information 2015 [employer]. This information is not intended to replace advice given to you by your health care provider. Make sure you discuss any questions you have with your health care provider.  

## 2014-08-24 ENCOUNTER — Inpatient Hospital Stay (HOSPITAL_COMMUNITY)
Admission: EM | Admit: 2014-08-24 | Discharge: 2014-08-25 | DRG: 313 | Disposition: A | Discharge status: Home / Self Care | Payer: Medicare Other | Attending: Internal Medicine | Admitting: Internal Medicine

## 2014-08-24 ENCOUNTER — Encounter (HOSPITAL_COMMUNITY): Payer: Self-pay | Admitting: *Deleted

## 2014-08-24 ENCOUNTER — Encounter (HOSPITAL_COMMUNITY): Payer: Self-pay

## 2014-08-24 DIAGNOSIS — I120 Hypertensive chronic kidney disease with stage 5 chronic kidney disease or end stage renal disease: Secondary | ICD-10-CM | POA: Diagnosis present

## 2014-08-24 DIAGNOSIS — I2510 Atherosclerotic heart disease of native coronary artery without angina pectoris: Secondary | ICD-10-CM | POA: Diagnosis present

## 2014-08-24 DIAGNOSIS — I252 Old myocardial infarction: Secondary | ICD-10-CM | POA: Diagnosis not present

## 2014-08-24 DIAGNOSIS — Z955 Presence of coronary angioplasty implant and graft: Secondary | ICD-10-CM | POA: Diagnosis not present

## 2014-08-24 DIAGNOSIS — I5042 Chronic combined systolic (congestive) and diastolic (congestive) heart failure: Secondary | ICD-10-CM | POA: Diagnosis present

## 2014-08-24 DIAGNOSIS — Z833 Family history of diabetes mellitus: Secondary | ICD-10-CM | POA: Diagnosis not present

## 2014-08-24 DIAGNOSIS — Z8249 Family history of ischemic heart disease and other diseases of the circulatory system: Secondary | ICD-10-CM

## 2014-08-24 DIAGNOSIS — K219 Gastro-esophageal reflux disease without esophagitis: Secondary | ICD-10-CM | POA: Diagnosis present

## 2014-08-24 DIAGNOSIS — E782 Mixed hyperlipidemia: Secondary | ICD-10-CM | POA: Diagnosis present

## 2014-08-24 DIAGNOSIS — F419 Anxiety disorder, unspecified: Secondary | ICD-10-CM | POA: Diagnosis present

## 2014-08-24 DIAGNOSIS — M1990 Unspecified osteoarthritis, unspecified site: Secondary | ICD-10-CM | POA: Diagnosis present

## 2014-08-24 DIAGNOSIS — Z8543 Personal history of malignant neoplasm of ovary: Secondary | ICD-10-CM

## 2014-08-24 DIAGNOSIS — E780 Pure hypercholesterolemia: Secondary | ICD-10-CM | POA: Diagnosis present

## 2014-08-24 DIAGNOSIS — Z8673 Personal history of transient ischemic attack (TIA), and cerebral infarction without residual deficits: Secondary | ICD-10-CM | POA: Diagnosis not present

## 2014-08-24 DIAGNOSIS — M109 Gout, unspecified: Secondary | ICD-10-CM | POA: Diagnosis present

## 2014-08-24 DIAGNOSIS — N189 Chronic kidney disease, unspecified: Secondary | ICD-10-CM

## 2014-08-24 DIAGNOSIS — R079 Chest pain, unspecified: Principal | ICD-10-CM | POA: Insufficient documentation

## 2014-08-24 DIAGNOSIS — Z951 Presence of aortocoronary bypass graft: Secondary | ICD-10-CM | POA: Diagnosis not present

## 2014-08-24 DIAGNOSIS — Z90710 Acquired absence of both cervix and uterus: Secondary | ICD-10-CM

## 2014-08-24 DIAGNOSIS — N186 End stage renal disease: Secondary | ICD-10-CM | POA: Diagnosis present

## 2014-08-24 DIAGNOSIS — I5032 Chronic diastolic (congestive) heart failure: Secondary | ICD-10-CM | POA: Diagnosis present

## 2014-08-24 DIAGNOSIS — E785 Hyperlipidemia, unspecified: Secondary | ICD-10-CM | POA: Diagnosis present

## 2014-08-24 DIAGNOSIS — Z85038 Personal history of other malignant neoplasm of large intestine: Secondary | ICD-10-CM

## 2014-08-24 DIAGNOSIS — D631 Anemia in chronic kidney disease: Secondary | ICD-10-CM | POA: Diagnosis present

## 2014-08-24 DIAGNOSIS — N184 Chronic kidney disease, stage 4 (severe): Secondary | ICD-10-CM

## 2014-08-24 DIAGNOSIS — R51 Headache: Secondary | ICD-10-CM | POA: Diagnosis present

## 2014-08-24 DIAGNOSIS — I10 Essential (primary) hypertension: Secondary | ICD-10-CM | POA: Diagnosis present

## 2014-08-24 DIAGNOSIS — E875 Hyperkalemia: Secondary | ICD-10-CM

## 2014-08-24 DIAGNOSIS — I5022 Chronic systolic (congestive) heart failure: Secondary | ICD-10-CM | POA: Diagnosis present

## 2014-08-24 DIAGNOSIS — Z992 Dependence on renal dialysis: Secondary | ICD-10-CM

## 2014-08-24 LAB — CBC WITH DIFFERENTIAL/PLATELET
BASOS PCT: 1 % (ref 0–1)
Basophils Absolute: 0.1 10*3/uL (ref 0.0–0.1)
Eosinophils Absolute: 0.5 10*3/uL (ref 0.0–0.7)
Eosinophils Relative: 7 % — ABNORMAL HIGH (ref 0–5)
HCT: 34.8 % — ABNORMAL LOW (ref 36.0–46.0)
Hemoglobin: 11.1 g/dL — ABNORMAL LOW (ref 12.0–15.0)
LYMPHS PCT: 16 % (ref 12–46)
Lymphs Abs: 1.2 10*3/uL (ref 0.7–4.0)
MCH: 32.9 pg (ref 26.0–34.0)
MCHC: 31.9 g/dL (ref 30.0–36.0)
MCV: 103.3 fL — AB (ref 78.0–100.0)
MONO ABS: 0.5 10*3/uL (ref 0.1–1.0)
Monocytes Relative: 7 % (ref 3–12)
Neutro Abs: 5.3 10*3/uL (ref 1.7–7.7)
Neutrophils Relative %: 69 % (ref 43–77)
PLATELETS: 228 10*3/uL (ref 150–400)
RBC: 3.37 MIL/uL — ABNORMAL LOW (ref 3.87–5.11)
RDW: 15.9 % — ABNORMAL HIGH (ref 11.5–15.5)
WBC: 7.6 10*3/uL (ref 4.0–10.5)

## 2014-08-24 LAB — I-STAT CHEM 8, ED
BUN: 74 mg/dL — AB (ref 6–23)
CHLORIDE: 104 mmol/L (ref 96–112)
CREATININE: 9.7 mg/dL — AB (ref 0.50–1.10)
Calcium, Ion: 1.08 mmol/L — ABNORMAL LOW (ref 1.13–1.30)
Glucose, Bld: 88 mg/dL (ref 70–99)
HCT: 39 % (ref 36.0–46.0)
HEMOGLOBIN: 13.3 g/dL (ref 12.0–15.0)
POTASSIUM: 6.1 mmol/L — AB (ref 3.5–5.1)
Sodium: 137 mmol/L (ref 135–145)
TCO2: 24 mmol/L (ref 0–100)

## 2014-08-24 LAB — I-STAT TROPONIN, ED
TROPONIN I, POC: 0.01 ng/mL (ref 0.00–0.08)
Troponin i, poc: 0 ng/mL (ref 0.00–0.08)

## 2014-08-24 LAB — TROPONIN I
Troponin I: 0.03 ng/mL (ref <0.031)
Troponin I: <0.03 ng/mL (ref <0.031)
Troponin I: <0.03 ng/mL (ref <0.031)

## 2014-08-24 LAB — TSH: TSH: 2.864 u[IU]/mL (ref 0.350–4.500)

## 2014-08-24 MED ORDER — LABETALOL HCL 100 MG PO TABS
150.0000 mg | ORAL_TABLET | Freq: Two times a day (BID) | ORAL | Status: DC
  Administered 2014-08-24: 150 mg via ORAL
  Filled 2014-08-24 (×7): qty 1.5

## 2014-08-24 MED ORDER — ACETAMINOPHEN 325 MG PO TABS
650.0000 mg | ORAL_TABLET | Freq: Four times a day (QID) | ORAL | Status: DC | PRN
  Filled 2014-08-24: qty 2

## 2014-08-24 MED ORDER — RENA-VITE PO TABS
ORAL_TABLET | Freq: Every day | ORAL | Status: DC
  Administered 2014-08-24 – 2014-08-25 (×2): 1 via ORAL
  Filled 2014-08-24 (×2): qty 1

## 2014-08-24 MED ORDER — LANTHANUM CARBONATE 500 MG PO CHEW
1000.0000 mg | CHEWABLE_TABLET | Freq: Three times a day (TID) | ORAL | Status: DC
  Administered 2014-08-24 – 2014-08-25 (×3): 1000 mg via ORAL
  Filled 2014-08-24 (×10): qty 2

## 2014-08-24 MED ORDER — SODIUM CHLORIDE 0.9 % IV SOLN: 100.0000 mL | INTRAVENOUS | Status: DC | PRN

## 2014-08-24 MED ORDER — EPOETIN ALFA 4000 UNIT/ML IJ SOLN: 4000.0000 [IU] | Freq: Once | INTRAMUSCULAR | Status: DC

## 2014-08-24 MED ORDER — SODIUM CHLORIDE 0.9 % IJ SOLN: 3.0000 mL | INTRAMUSCULAR | Status: DC | PRN

## 2014-08-24 MED ORDER — SODIUM CHLORIDE 0.9 % IV SOLN: 250.0000 mL | INTRAVENOUS | Status: DC | PRN

## 2014-08-24 MED ORDER — ISOSORBIDE DINITRATE 20 MG PO TABS
10.0000 mg | ORAL_TABLET | Freq: Three times a day (TID) | ORAL | Status: DC
  Administered 2014-08-24 (×2): 10 mg via ORAL
  Filled 2014-08-24 (×3): qty 1

## 2014-08-24 MED ORDER — REGADENOSON 0.4 MG/5ML IV SOLN
0.4000 mg | Freq: Once | INTRAVENOUS | Status: AC
  Administered 2014-08-25: 0.4 mg via INTRAVENOUS
  Filled 2014-08-24: qty 5

## 2014-08-24 MED ORDER — CLONIDINE HCL 0.2 MG PO TABS
0.2000 mg | ORAL_TABLET | Freq: Two times a day (BID) | ORAL | Status: DC
  Filled 2014-08-24 (×3): qty 1

## 2014-08-24 MED ORDER — SODIUM CHLORIDE 0.9 % IJ SOLN
3.0000 mL | Freq: Two times a day (BID) | INTRAMUSCULAR | Status: DC
  Administered 2014-08-24 (×2): 3 mL via INTRAVENOUS

## 2014-08-24 MED ORDER — LORATADINE 10 MG PO TABS
10.0000 mg | ORAL_TABLET | Freq: Every day | ORAL | Status: DC
  Administered 2014-08-24 – 2014-08-25 (×2): 10 mg via ORAL
  Filled 2014-08-24 (×2): qty 1

## 2014-08-24 MED ORDER — LIDOCAINE HCL (PF) 1 % IJ SOLN: 5.0000 mL | INTRAMUSCULAR | Status: DC | PRN

## 2014-08-24 MED ORDER — FLUTICASONE PROPIONATE 50 MCG/ACT NA SUSP
2.0000 | Freq: Every day | NASAL | Status: DC | PRN
  Administered 2014-08-24: 2 via NASAL
  Filled 2014-08-24: qty 16

## 2014-08-24 MED ORDER — ACETAMINOPHEN 650 MG RE SUPP: 650.0000 mg | Freq: Four times a day (QID) | RECTAL | Status: DC | PRN

## 2014-08-24 MED ORDER — ALTEPLASE 2 MG IJ SOLR: 2.0000 mg | Freq: Once | INTRAMUSCULAR | Status: AC | PRN

## 2014-08-24 MED ORDER — AMLODIPINE BESYLATE 5 MG PO TABS
10.0000 mg | ORAL_TABLET | Freq: Every day | ORAL | Status: DC
  Administered 2014-08-24 – 2014-08-25 (×2): 10 mg via ORAL
  Filled 2014-08-24 (×2): qty 2

## 2014-08-24 MED ORDER — SODIUM CHLORIDE 0.9 % IJ SOLN
3.0000 mL | Freq: Two times a day (BID) | INTRAMUSCULAR | Status: DC
  Administered 2014-08-24 – 2014-08-25 (×2): 3 mL via INTRAVENOUS

## 2014-08-24 MED ORDER — OXYCODONE HCL 5 MG PO TABS: 5.0000 mg | ORAL_TABLET | ORAL | Status: DC | PRN

## 2014-08-24 MED ORDER — ZOLPIDEM TARTRATE 5 MG PO TABS: 5.0000 mg | ORAL_TABLET | Freq: Every evening | ORAL | Status: DC | PRN

## 2014-08-24 MED ORDER — SENNOSIDES-DOCUSATE SODIUM 8.6-50 MG PO TABS: 1.0000 | ORAL_TABLET | Freq: Every evening | ORAL | Status: DC | PRN

## 2014-08-24 MED ORDER — ASPIRIN EC 81 MG PO TBEC
81.0000 mg | DELAYED_RELEASE_TABLET | Freq: Every day | ORAL | Status: DC
Start: 2014-08-24 — End: 2014-08-25
  Administered 2014-08-24 – 2014-08-25 (×2): 81 mg via ORAL
  Filled 2014-08-24 (×2): qty 1

## 2014-08-24 MED ORDER — HEPARIN SODIUM (PORCINE) 5000 UNIT/ML IJ SOLN
5000.0000 [IU] | Freq: Three times a day (TID) | INTRAMUSCULAR | Status: DC
  Administered 2014-08-24 – 2014-08-25 (×3): 5000 [IU] via SUBCUTANEOUS
  Filled 2014-08-24 (×3): qty 1

## 2014-08-24 MED ORDER — PANTOPRAZOLE SODIUM 40 MG PO TBEC
40.0000 mg | DELAYED_RELEASE_TABLET | Freq: Every day | ORAL | Status: DC
  Administered 2014-08-24: 40 mg via ORAL
  Filled 2014-08-24 (×2): qty 1

## 2014-08-24 MED ORDER — ONDANSETRON HCL 4 MG PO TABS: 4.0000 mg | ORAL_TABLET | Freq: Four times a day (QID) | ORAL | Status: DC | PRN

## 2014-08-24 MED ORDER — ALPRAZOLAM 0.5 MG PO TABS
0.5000 mg | ORAL_TABLET | Freq: Three times a day (TID) | ORAL | Status: DC
  Administered 2014-08-24 (×2): 0.5 mg via ORAL
  Filled 2014-08-24 (×3): qty 1

## 2014-08-24 MED ORDER — ONDANSETRON HCL 4 MG/2ML IJ SOLN: 4.0000 mg | Freq: Four times a day (QID) | INTRAMUSCULAR | Status: DC | PRN

## 2014-08-24 MED ORDER — NITROGLYCERIN 0.4 MG SL SUBL
0.4000 mg | SUBLINGUAL_TABLET | SUBLINGUAL | Status: DC | PRN
  Administered 2014-08-24 (×2): 0.4 mg via SUBLINGUAL
  Filled 2014-08-24: qty 1

## 2014-08-24 MED ORDER — CINACALCET HCL 30 MG PO TABS
30.0000 mg | ORAL_TABLET | Freq: Every day | ORAL | Status: DC
Start: 2014-08-24 — End: 2014-08-25
  Administered 2014-08-24 (×2): 30 mg via ORAL
  Filled 2014-08-24 (×3): qty 1

## 2014-08-24 MED ORDER — SIMVASTATIN 20 MG PO TABS
20.0000 mg | ORAL_TABLET | Freq: Every day | ORAL | Status: DC
  Administered 2014-08-24: 20 mg via ORAL
  Filled 2014-08-24 (×2): qty 1

## 2014-08-24 MED ORDER — LIDOCAINE-PRILOCAINE 2.5-2.5 % EX CREA: 1.0000 "application " | TOPICAL_CREAM | CUTANEOUS | Status: DC | PRN

## 2014-08-24 MED ORDER — HEPARIN SODIUM (PORCINE) 1000 UNIT/ML DIALYSIS: 1000.0000 [IU] | INTRAMUSCULAR | Status: DC | PRN

## 2014-08-24 MED ORDER — PENTAFLUOROPROP-TETRAFLUOROETH EX AERO: 1.0000 "application " | INHALATION_SPRAY | CUTANEOUS | Status: DC | PRN

## 2014-08-24 NOTE — H&P (Signed)
[facility]          History and Physical    PCP:   [name], MD   Chief Complaint:  Chest Pain  HPI: Patient is a 74 y/o woman with h/o CAD s/p CABG, HTN, HLD, chronic diastolic CHF, GERD and ESRD on HD who presents with CP. She states CP began after dinner last pm while lying in bed. She describes pain as a "soreness" over her left breast. She took an antacid and went back to bed. Was woken up with persistent pain that now had radiated into her left shoulder. Pain was now similar to when she had her MI, so she became concerned and had her husband bring her to the ED. Work up so far shows a negative troponin, EKG shows LVH with repol abnormalities. Noted to have a K of 6.1. Admission was requested.  Allergies:   Allergies  Allergen Reactions  . Aspirin Other (See Comments)    Mess up her stomach; "makes my bowels have blood in them". Takes 81 mg EC Aspirin   . Contrast Media [Iodinated Diagnostic Agents] Itching  . Iron Itching and Other (See Comments)    "they gave me iron in dialysis; had to give me Benadryl cause I had to have the iron" (05/02/2012)  . Macrodantin [Nitrofurantoin Macrocrystal] Other (See Comments)    "broke me out in big old knots all over my body; had to go to ER"  . Penicillins Other (See Comments)    "makes me real weak when I take it; like I'll pass out"  . Plavix [Clopidogrel Bisulfate] Rash  . Bactrim [Sulfamethoxazole-Trimethoprim] Rash  . Sulfa Antibiotics Rash  . Venofer [Ferric Oxide] Itching    Patient reports using Benadryl prior to doses as [facility]  . Dexilant [Dexlansoprazole] Other (See Comments)    Upset stomach  . Prilosec [Omeprazole] Other (See Comments)    "back spasms"  . Levaquin [Levofloxacin In D5w] Rash  . Protonix [Pantoprazole Sodium] Rash      Past Medical History  Diagnosis Date  . Chronic bronchitis   . GERD (gastroesophageal reflux disease)   . PUD (peptic ulcer disease)   . History  of lower GI bleeding   . Arthritis   . History of gout   . CAD (coronary artery disease)     a. 12/2011 NSTEMI/Cath/PCI LCX (2.25x14 Resolute DES) & D1 (2.25x22 Resolute DES);  b. 01/2012 Cath/PCI: LM 30, LAD 30p, 40-23m, D1 stent ok, 99 in sm branch of diag, LCX patent stent, OM1 20, RCA 95 ost (4.0x12 Promus DES), EF 55%;  c. 04/2012 Lexi Cardiolite  EF 48%, small area of scar @ base/mid inflat wall with mild peri-infarct ischemia.; CABG 12/4  . High cholesterol 12/2011  . Pneumonia ~ 2009  . Iron deficiency anemia   . TIA (transient ischemic attack)   . Anxiety   . History of blood transfusion 07/2011; 12/2011; 01/2012 X 2; 04/2012  . Carotid artery disease     a. [id number] LICA, [id number]   . Mitral regurgitation     a. Moderate by echo, 02/2012  . Myocardial infarction   . Chronic diastolic CHF (congestive heart failure)     a. 02/2012 Echo EF 60-65%, nl wall motion, Gr 1 DD, mod MR  . Hypertension   . AVM (arteriovenous malformation) of colon   . Esophageal stricture   . Ovarian cancer 1992  . Colon  cancer 1992  . ESRD on hemodialysis     ESRD due to HTN, started dialysis 2011 and gets HD at [facility] with Dr Hinda Lenis on MWF schedule.  Access is LUA AVF as of Sept 2014.     Past Surgical History  Procedure Laterality Date  . Abdominal hysterectomy  1992  . Appendectomy  06/1990  . Tubal ligation  1980's  . Av fistula placement  07/2009    left upper arm  . Thrombectomy / arteriovenous graft revision  2011    left upper arm  . Colon resection  1992  . Esophagogastroduodenoscopy  01/20/2012    Procedure: ESOPHAGOGASTRODUODENOSCOPY (EGD);  Surgeon: Ladene Artist, MD,FACG;  Location: [facility] ENDOSCOPY;  Service: Endoscopy;  Laterality: N/A;  . Dilation and curettage of uterus    . Coronary angioplasty with stent placement  12/15/11    "2"  . Coronary angioplasty with stent placement  y/2013    "1; makes total of 3" (05/02/2012)  . Coronary artery bypass graft  06/13/2012    Procedure:  CORONARY ARTERY BYPASS GRAFTING (CABG);  Surgeon: Grace Isaac, MD;  Location: [facility];  Service: Open Heart Surgery;  Laterality: N/A;  cabg x four;  using left internal mammary artery, and left leg greater saphenous vein harvested endoscopically  . Intraoperative transesophageal echocardiogram  06/13/2012    Procedure: INTRAOPERATIVE TRANSESOPHAGEAL ECHOCARDIOGRAM;  Surgeon: Grace Isaac, MD;  Location: [facility];  Service: Open Heart Surgery;  Laterality: N/A;  . Esophagogastroduodenoscopy N/A 03/26/2013    Procedure: ESOPHAGOGASTRODUODENOSCOPY (EGD);  Surgeon: Irene Shipper, MD;  Location: [facility] ENDOSCOPY;  Service: Endoscopy;  Laterality: N/A;  . Ovary surgery      ovarian cancer  . Cardiac surgery    . Left heart catheterization with coronary angiogram N/A 12/15/2011    Procedure: LEFT HEART CATHETERIZATION WITH CORONARY ANGIOGRAM;  Surgeon: Burnell Blanks, MD;  Location: [facility] CATH LAB;  Service: Cardiovascular;  Laterality: N/A;  . Left heart catheterization with coronary angiogram N/A 01/10/2012    Procedure: LEFT HEART CATHETERIZATION WITH CORONARY ANGIOGRAM;  Surgeon: Peter M Martinique, MD;  Location: [facility] CATH LAB;  Service: Cardiovascular;  Laterality: N/A;  . Left heart catheterization with coronary angiogram N/A 06/08/2012    Procedure: LEFT HEART CATHETERIZATION WITH CORONARY ANGIOGRAM;  Surgeon: Burnell Blanks, MD;  Location: [facility] CATH LAB;  Service: Cardiovascular;  Laterality: N/A;  . Shuntogram N/A 10/15/2013    Procedure: Fistulogram;  Surgeon: Serafina Mitchell, MD;  Location: [facility] CATH LAB;  Service: Cardiovascular;  Laterality: N/A;  . Left heart catheterization with coronary/graft angiogram N/A 12/10/2013    Procedure: LEFT HEART CATHETERIZATION WITH Beatrix Fetters;  Surgeon: [name], MD;  Location: [facility] CATH LAB;  Service: Cardiovascular;  Laterality: N/A;    Prior to Admission medications   Medication Sig Start Date End Date Taking? Authorizing Provider    ALPRAZolam Duanne Moron) 0.5 MG tablet Take 0.5 mg by mouth 3 (three) times daily.    Historical Provider, MD  amLODipine (NORVASC) 10 MG tablet Take 1 tablet (10 mg total) by mouth daily. 12/17/13   Lendon Colonel, NP  aspirin EC 81 MG tablet Take 81 mg by mouth daily.    Historical Provider, MD  cloNIDine (CATAPRES) 0.2 MG tablet Take 1 tablet (0.2 mg total) by mouth 2 (two) times daily. 12/23/13   Fay Records, MD  fluticasone (FLONASE) 50 MCG/ACT nasal spray Place 2 sprays into the nose daily as needed for allergies.  Historical Provider, MD  folic acid-vitamin b complex-vitamin c-selenium-zinc (DIALYVITE) 3 MG TABS Take 1 tablet by mouth daily.    Historical Provider, MD  isosorbide dinitrate (ISORDIL) 10 MG tablet Take 1 tablet (10 mg total) by mouth 3 (three) times daily. 01/16/14   Lendon Colonel, NP  labetalol (NORMODYNE) 100 MG tablet Take 1.5 tablets (150 mg total) by mouth 2 (two) times daily. 11/13/13   Herminio Commons, MD  lanthanum (FOSRENOL) 1000 MG chewable tablet Chew 1,000 mg by mouth 3 (three) times daily after meals.    Historical Provider, MD  loratadine (CLARITIN) 10 MG tablet Take 10 mg by mouth daily.     Historical Provider, MD  nitroGLYCERIN (NITROSTAT) 0.4 MG SL tablet Place 1 tablet (0.4 mg total) under the tongue every 5 (five) minutes x 3 doses as needed for chest pain. 06/23/14   Herminio Commons, MD  omeprazole (PRILOSEC) 20 MG capsule Take 1 capsule (20 mg total) by mouth daily. 02/10/14   Ladene Artist, MD  ondansetron (ZOFRAN) 4 MG tablet Take 1 tablet (4 mg total) by mouth every 6 (six) hours. 04/24/14   Ezequiel Essex, MD  SENSIPAR 30 MG tablet Take 30 mg by mouth at bedtime.  12/21/12   Historical Provider, MD  simvastatin (ZOCOR) 20 MG tablet Take 1 tablet by mouth daily. 03/13/14   Historical Provider, MD  tetrahydrozoline 0.05 % ophthalmic solution Place 1 drop into both eyes 2 (two) times daily as needed (irritation).    Historical Provider, MD   zolpidem (AMBIEN) 10 MG tablet Take 10 mg by mouth at bedtime as needed for sleep.  05/27/13   Historical Provider, MD    Social History:  reports that she has never smoked. She has never used smokeless tobacco. She reports that she does not drink alcohol or use illicit drugs.  Family History  Problem Relation Age of Onset  . Other      noncontributory for early CAD  . Heart disease Mother     Heart Disease before age 74  . Hyperlipidemia Mother   . Hypertension Mother   . Diabetes Mother   . Heart attack Mother   . Heart disease Father     Heart Disease before age 86  . Hyperlipidemia Father   . Hypertension Father   . Diabetes Father   . Diabetes Sister   . Hypertension Sister   . Diabetes Brother   . Hyperlipidemia Brother   . Heart attack Brother   . Colon cancer Neg Hx   . Esophageal cancer Neg Hx   . Liver disease Neg Hx   . Kidney disease Neg Hx   . Colon polyps Neg Hx   . Hypertension Sister   . Heart attack Brother     Review of Systems:  Constitutional: Denies fever, chills, diaphoresis, appetite change and fatigue.  HEENT: Denies photophobia, eye pain, redness, hearing loss, ear pain, congestion, sore throat, rhinorrhea, sneezing, mouth sores, trouble swallowing, neck pain, neck stiffness and tinnitus.   Respiratory: Denies SOB, DOE, cough,,  and wheezing.   Cardiovascular: Denies , palpitations and leg swelling.  Gastrointestinal: Denies nausea, vomiting, abdominal pain, diarrhea, constipation, blood in stool and abdominal distention.  Genitourinary: Denies dysuria, urgency, frequency, hematuria, flank pain and difficulty urinating.  Endocrine: Denies: hot or cold intolerance, sweats, changes in hair or nails, polyuria, polydipsia. Musculoskeletal: Denies myalgias, back pain, joint swelling, arthralgias and gait problem.  Skin: Denies pallor, rash and wound.  Neurological: Denies dizziness, seizures,  syncope, weakness, light-headedness, numbness and  headaches.  Hematological: Denies adenopathy. Easy bruising, personal or family bleeding history  Psychiatric/Behavioral: Denies suicidal ideation, mood changes, confusion, nervousness, sleep disturbance and agitation   Physical Exam: Blood pressure 172/60, pulse 67, temperature 97.9 F (36.6 C), temperature source Oral, resp. rate 14, height 5\' 1"  (1.549 m), weight 67.1 kg (147 lb 14.9 oz), SpO2 99 %. Gen: AA Ox3 HEENT: [place]/AT/PERRL/EOMI/wears corrective lenses, moist mucous membranes, no pharyngeal erythema. Neck: Supple, no JVD, no LAD, no bruits, no goiter CV: RRR, no M/R/G Lungs: CTA B Abd: S/NT/ND/+BS Ext: no C/C/E/+pulses Neuro: intact/non-focal  Labs on Admission:  Results for orders placed or performed during the hospital encounter of 08/24/14 (from the past 48 hour(s))  I-stat troponin, ED     Status: None   Collection Time: 08/24/14  1:48 AM  Result Value Ref Range   Troponin i, poc 0.00 0.00 - 0.08 ng/mL   Comment 3            Comment: Due to the release kinetics of cTnI, a negative result within the first hours of the onset of symptoms does not rule out myocardial infarction with certainty. If myocardial infarction is still suspected, repeat the test at appropriate intervals.   I-stat chem 8, ed     Status: Abnormal   Collection Time: 08/24/14  1:49 AM  Result Value Ref Range   Sodium 137 135 - 145 mmol/L   Potassium 6.1 (HH) 3.5 - 5.1 mmol/L   Chloride 104 96 - 112 mmol/L   BUN 74 (H) 6 - 23 mg/dL   Creatinine, Ser 9.70 (H) 0.50 - 1.10 mg/dL   Glucose, Bld 88 70 - 99 mg/dL   Calcium, Ion 1.08 (L) 1.13 - 1.30 mmol/L   TCO2 24 0 - 100 mmol/L   Hemoglobin 13.3 12.0 - 15.0 g/dL   HCT 39.0 36.0 - 46.0 %  CBC with Differential/Platelet     Status: Abnormal   Collection Time: 08/24/14  2:17 AM  Result Value Ref Range   WBC 7.6 4.0 - 10.5 K/uL   RBC 3.37 (L) 3.87 - 5.11 MIL/uL   Hemoglobin 11.1 (L) 12.0 - 15.0 g/dL   HCT 34.8 (L) 36.0 - 46.0 %   MCV 103.3 (H)  78.0 - 100.0 fL   MCH 32.9 26.0 - 34.0 pg   MCHC 31.9 30.0 - 36.0 g/dL   RDW 15.9 (H) 11.5 - 15.5 %   Platelets 228 150 - 400 K/uL   Neutrophils Relative % 69 43 - 77 %   Neutro Abs 5.3 1.7 - 7.7 K/uL   Lymphocytes Relative 16 12 - 46 %   Lymphs Abs 1.2 0.7 - 4.0 K/uL   Monocytes Relative 7 3 - 12 %   Monocytes Absolute 0.5 0.1 - 1.0 K/uL   Eosinophils Relative 7 (H) 0 - 5 %   Eosinophils Absolute 0.5 0.0 - 0.7 K/uL   Basophils Relative 1 0 - 1 %   Basophils Absolute 0.1 0.0 - 0.1 K/uL  Troponin I     Status: None   Collection Time: 08/24/14  3:45 AM  Result Value Ref Range   Troponin I 0.03 <0.031 ng/mL    Comment:        NO INDICATION OF MYOCARDIAL INJURY.   I-stat troponin, ED     Status: None   Collection Time: 08/24/14  6:09 AM  Result Value Ref Range   Troponin i, poc 0.01 0.00 - 0.08 ng/mL   Comment  3            Comment: Due to the release kinetics of cTnI, a negative result within the first hours of the onset of symptoms does not rule out myocardial infarction with certainty. If myocardial infarction is still suspected, repeat the test at appropriate intervals.     Radiological Exams on Admission: No results found.  Assessment/Plan Principal Problem:   Chest pain Active Problems:   Coronary atherosclerosis of native coronary artery   Essential hypertension, benign   ESRD on hemodialysis   HLD (hyperlipidemia)   Anemia of chronic kidney failure   Chronic diastolic CHF (congestive heart failure)   Hyperkalemia    Chest Pain -Rule out for ACS. -EKG without acute abnormalities. -Has CAD and is s/p CABG. -Since pain was similar to MI, will have cardiology see for potential stress testing. -Last cardiac catheterization on 12/10/2013 revealing patent left main coronary artery, with focal significant disease in the mid and distal left anterior descending artery, patent diagonal branch LIMA to LAD, widely patent, severe restenosis of the proximal left  circumflex artery, stent placement SVG to circumflex 70% proximal ramus stenosis widely patent SVG to ramus. Occlusive the ostium of the right coronary artery, widely patent SVG to distal RCA.  Hyperkalemia -For HD today. -Regular schedule is MWF.  ESRD -On HD. -Appreciate renal input.  Chronic Diastolic CHF -Compensated. -Not on ACE/ARB due to CKD.  HLD -Continue statin. -Fair control.  HTN -Continue home meds.  DVT Prophylaxis -Lovenox  Code Status -Full code   Time Spent on Admission: 85 minutes  [facility] Pager: [phone] 08/24/2014, 9:49 AM

## 2014-08-24 NOTE — ED Notes (Signed)
Report given to [facility], Therapist, sports. Pt denies pain at this time. NAD. Husband at bedside. VSS.

## 2014-08-24 NOTE — ED Notes (Signed)
Report attempted by Alinda Dooms. Was told by Darius Bump, RN that she would not be able to give report at this time.

## 2014-08-24 NOTE — ED Notes (Signed)
Family at bedside. Patient in hospital gown and on cardiac monitoring at this time.

## 2014-08-24 NOTE — ED Notes (Addendum)
Pt states when she was going to bed around 11:30 tonight pt began having left sided, aching chest pain that radiated into her left shoulder. Pt states it gets worse when she lies down. Pt states she had the same kind of pain several days ago but it went away. Pt states her BP has been high today. Pt took a 81 mg aspirin pta.

## 2014-08-24 NOTE — ED Provider Notes (Signed)
CSN: [id number]     Arrival date & time 08/24/14  0047 History   First MD Initiated Contact with Patient 08/24/14 0110     Chief Complaint  Patient presents with  . Chest Pain     (Consider location/radiation/quality/duration/timing/severity/associated sxs/prior Treatment) HPI  This is a 74 year old female with CAD and end-stage renal disease on hemodialysis. She is here with chest pain that began about 11:30PM yesterday evening as she was getting ready for bed. She describes it as a well-localized aching which was followed by a second well-localized pain of the left shoulder. The pain was "pretty bad" earlier but has nearly resolved now. It was associated with nausea which has improved. She denies shortness of breath. She denies diaphoresis. She took 81 milligrams of aspirin prior to arrival. It was somewhat worse when lying supine. She states her heart felt like it was racing earlier but this has also improved.  Past Medical History  Diagnosis Date  . Chronic bronchitis   . GERD (gastroesophageal reflux disease)   . PUD (peptic ulcer disease)   . History of lower GI bleeding   . Arthritis   . History of gout   . CAD (coronary artery disease)     a. 12/2011 NSTEMI/Cath/PCI LCX (2.25x14 Resolute DES) & D1 (2.25x22 Resolute DES);  b. 01/2012 Cath/PCI: LM 30, LAD 30p, 40-36m, D1 stent ok, 99 in sm branch of diag, LCX patent stent, OM1 20, RCA 95 ost (4.0x12 Promus DES), EF 55%;  c. 04/2012 Lexi Cardiolite  EF 48%, small area of scar @ base/mid inflat wall with mild peri-infarct ischemia.; CABG 12/4  . High cholesterol 12/2011  . Pneumonia ~ 2009  . Iron deficiency anemia   . TIA (transient ischemic attack)   . Anxiety   . History of blood transfusion 07/2011; 12/2011; 01/2012 X 2; 04/2012  . Carotid artery disease     a. [id number] LICA, [id number]   . Mitral regurgitation     a. Moderate by echo, 02/2012  . Myocardial infarction   . Chronic diastolic CHF (congestive heart failure)     a. 02/2012  Echo EF 60-65%, nl wall motion, Gr 1 DD, mod MR  . Hypertension   . AVM (arteriovenous malformation) of colon   . Esophageal stricture   . Ovarian cancer 1992  . Colon cancer 1992  . ESRD on hemodialysis     ESRD due to HTN, started dialysis 2011 and gets HD at [facility] with Dr Hinda Lenis on MWF schedule.  Access is LUA AVF as of Sept 2014.    Past Surgical History  Procedure Laterality Date  . Abdominal hysterectomy  1992  . Appendectomy  06/1990  . Tubal ligation  1980's  . Av fistula placement  07/2009    left upper arm  . Thrombectomy / arteriovenous graft revision  2011    left upper arm  . Colon resection  1992  . Esophagogastroduodenoscopy  01/20/2012    Procedure: ESOPHAGOGASTRODUODENOSCOPY (EGD);  Surgeon: Ladene Artist, MD,FACG;  Location: [facility] ENDOSCOPY;  Service: Endoscopy;  Laterality: N/A;  . Dilation and curettage of uterus    . Coronary angioplasty with stent placement  12/15/11    "2"  . Coronary angioplasty with stent placement  y/2013    "1; makes total of 3" (05/02/2012)  . Coronary artery bypass graft  06/13/2012    Procedure: CORONARY ARTERY BYPASS GRAFTING (CABG);  Surgeon: Grace Isaac, MD;  Location: [place];  Service: Open Heart Surgery;  Laterality: N/A;  cabg x four;  using left internal mammary artery, and left leg greater saphenous vein harvested endoscopically  . Intraoperative transesophageal echocardiogram  06/13/2012    Procedure: INTRAOPERATIVE TRANSESOPHAGEAL ECHOCARDIOGRAM;  Surgeon: Grace Isaac, MD;  Location: [place];  Service: Open Heart Surgery;  Laterality: N/A;  . Esophagogastroduodenoscopy N/A 03/26/2013    Procedure: ESOPHAGOGASTRODUODENOSCOPY (EGD);  Surgeon: Irene Shipper, MD;  Location: [facility] ENDOSCOPY;  Service: Endoscopy;  Laterality: N/A;  . Ovary surgery      ovarian cancer  . Cardiac surgery    . Left heart catheterization with coronary angiogram N/A 12/15/2011    Procedure: LEFT HEART CATHETERIZATION WITH CORONARY ANGIOGRAM;   Surgeon: Burnell Blanks, MD;  Location: [facility] CATH LAB;  Service: Cardiovascular;  Laterality: N/A;  . Left heart catheterization with coronary angiogram N/A 01/10/2012    Procedure: LEFT HEART CATHETERIZATION WITH CORONARY ANGIOGRAM;  Surgeon: Peter M Martinique, MD;  Location: [facility] CATH LAB;  Service: Cardiovascular;  Laterality: N/A;  . Left heart catheterization with coronary angiogram N/A 06/08/2012    Procedure: LEFT HEART CATHETERIZATION WITH CORONARY ANGIOGRAM;  Surgeon: Burnell Blanks, MD;  Location: [facility] CATH LAB;  Service: Cardiovascular;  Laterality: N/A;  . Shuntogram N/A 10/15/2013    Procedure: Fistulogram;  Surgeon: Serafina Mitchell, MD;  Location: [facility] CATH LAB;  Service: Cardiovascular;  Laterality: N/A;  . Left heart catheterization with coronary/graft angiogram N/A 12/10/2013    Procedure: LEFT HEART CATHETERIZATION WITH Beatrix Fetters;  Surgeon: [name], MD;  Location: [facility] CATH LAB;  Service: Cardiovascular;  Laterality: N/A;   Family History  Problem Relation Age of Onset  . Other      noncontributory for early CAD  . Heart disease Mother     Heart Disease before age 68  . Hyperlipidemia Mother   . Hypertension Mother   . Diabetes Mother   . Heart attack Mother   . Heart disease Father     Heart Disease before age 29  . Hyperlipidemia Father   . Hypertension Father   . Diabetes Father   . Diabetes Sister   . Hypertension Sister   . Diabetes Brother   . Hyperlipidemia Brother   . Heart attack Brother   . Colon cancer Neg Hx   . Esophageal cancer Neg Hx   . Liver disease Neg Hx   . Kidney disease Neg Hx   . Colon polyps Neg Hx   . Hypertension Sister   . Heart attack Brother    History  Substance Use Topics  . Smoking status: Never Smoker   . Smokeless tobacco: Never Used  . Alcohol Use: No   OB History    No data available     Review of Systems  All other systems reviewed and are negative.   Allergies  Aspirin; Contrast media;  Iron; Macrodantin; Penicillins; Plavix; Bactrim; Sulfa antibiotics; Venofer; Dexilant; Prilosec; Levaquin; and Protonix  Home Medications   Prior to Admission medications   Medication Sig Start Date End Date Taking? Authorizing Provider  ALPRAZolam Duanne Moron) 0.5 MG tablet Take 0.5 mg by mouth 3 (three) times daily.    Historical Provider, MD  amLODipine (NORVASC) 10 MG tablet Take 1 tablet (10 mg total) by mouth daily. 12/17/13   Lendon Colonel, NP  aspirin EC 81 MG tablet Take 81 mg by mouth daily.    Historical Provider, MD  cloNIDine (CATAPRES) 0.2 MG tablet Take 1 tablet (0.2 mg total) by mouth 2 (two) times daily. 12/23/13  Fay Records, MD  fluticasone [facility]) 50 MCG/ACT nasal spray Place 2 sprays into the nose daily as needed for allergies.     Historical Provider, MD  folic acid-vitamin b complex-vitamin c-selenium-zinc (DIALYVITE) 3 MG TABS Take 1 tablet by mouth daily.    Historical Provider, MD  isosorbide dinitrate (ISORDIL) 10 MG tablet Take 1 tablet (10 mg total) by mouth 3 (three) times daily. 01/16/14   Lendon Colonel, NP  labetalol (NORMODYNE) 100 MG tablet Take 1.5 tablets (150 mg total) by mouth 2 (two) times daily. 11/13/13   Herminio Commons, MD  lanthanum (FOSRENOL) 1000 MG chewable tablet Chew 1,000 mg by mouth 3 (three) times daily after meals.    Historical Provider, MD  loratadine (CLARITIN) 10 MG tablet Take 10 mg by mouth daily.     Historical Provider, MD  nitroGLYCERIN (NITROSTAT) 0.4 MG SL tablet Place 1 tablet (0.4 mg total) under the tongue every 5 (five) minutes x 3 doses as needed for chest pain. 06/23/14   Herminio Commons, MD  omeprazole (PRILOSEC) 20 MG capsule Take 1 capsule (20 mg total) by mouth daily. 02/10/14   Ladene Artist, MD  ondansetron (ZOFRAN) 4 MG tablet Take 1 tablet (4 mg total) by mouth every 6 (six) hours. 04/24/14   Ezequiel Essex, MD  SENSIPAR 30 MG tablet Take 30 mg by mouth at bedtime.  12/21/12   Historical Provider, MD   simvastatin (ZOCOR) 20 MG tablet Take 1 tablet by mouth daily. 03/13/14   Historical Provider, MD  tetrahydrozoline 0.05 % ophthalmic solution Place 1 drop into both eyes 2 (two) times daily as needed (irritation).    Historical Provider, MD  zolpidem (AMBIEN) 10 MG tablet Take 10 mg by mouth at bedtime as needed for sleep.  05/27/13   Historical Provider, MD   BP 150/65 mmHg  Pulse 72  Temp(Src) 97.9 F (36.6 C)  Resp 16  Ht 5\' 1"  (1.549 m)  Wt 146 lb (66.225 kg)  BMI 27.60 kg/m2  SpO2 95%   Physical Exam  General: Well-developed, well-nourished female in no acute distress; appearance consistent with age of record HENT: normocephalic; atraumatic Eyes: pupils equal, round and reactive to light; extraocular muscles intact Neck: supple Heart: regular rate and rhythm; no murmur Lungs: clear to auscultation bilaterally Abdomen: soft; nondistended; nontender; no masses or hepatosplenomegaly; bowel sounds present Extremities: No deformity; full range of motion; pulses normal; left upper arm dialysis fistula with pulse Neurologic: Awake, alert and oriented; motor function intact in all extremities and symmetric; no facial droop Skin: Warm and dry Psychiatric: Normal mood and affect   ED Course  Procedures (including critical care time)   EKG Interpretation   Date/Time:  Sunday August 24 2014 01:00:24 EST Ventricular Rate:  75 PR Interval:  162 QRS Duration: 102 QT Interval:  422 QTC Calculation: 471 R Axis:   45 Text Interpretation:  Sinus rhythm Probable LVH with secondary repol abnrm  Anterior Q waves, possibly due to LVH Baseline wander in lead(s) I V2 No  significant change was found Confirmed by Florina Ou  MD, Jenny Reichmann ([id number]) on  08/24/2014 1:09:32 AM      MDM  Nursing notes and vitals signs, including pulse oximetry, reviewed.  Summary of this visit's results, reviewed by myself:  Labs:  Results for orders placed or performed during the hospital encounter of 08/24/14  (from the past 24 hour(s))  I-stat troponin, ED     Status: None   Collection Time: 08/24/14  1:48 AM  Result Value Ref Range   Troponin i, poc 0.00 0.00 - 0.08 ng/mL   Comment 3          I-stat chem 8, ed     Status: Abnormal   Collection Time: 08/24/14  1:49 AM  Result Value Ref Range   Sodium 137 135 - 145 mmol/L   Potassium 6.1 (HH) 3.5 - 5.1 mmol/L   Chloride 104 96 - 112 mmol/L   BUN 74 (H) 6 - 23 mg/dL   Creatinine, Ser 9.70 (H) 0.50 - 1.10 mg/dL   Glucose, Bld 88 70 - 99 mg/dL   Calcium, Ion 1.08 (L) 1.13 - 1.30 mmol/L   TCO2 24 0 - 100 mmol/L   Hemoglobin 13.3 12.0 - 15.0 g/dL   HCT 39.0 36.0 - 46.0 %  CBC with Differential/Platelet     Status: Abnormal   Collection Time: 08/24/14  2:17 AM  Result Value Ref Range   WBC 7.6 4.0 - 10.5 K/uL   RBC 3.37 (L) 3.87 - 5.11 MIL/uL   Hemoglobin 11.1 (L) 12.0 - 15.0 g/dL   HCT 34.8 (L) 36.0 - 46.0 %   MCV 103.3 (H) 78.0 - 100.0 fL   MCH 32.9 26.0 - 34.0 pg   MCHC 31.9 30.0 - 36.0 g/dL   RDW 15.9 (H) 11.5 - 15.5 %   Platelets 228 150 - 400 K/uL   Neutrophils Relative % 69 43 - 77 %   Neutro Abs 5.3 1.7 - 7.7 K/uL   Lymphocytes Relative 16 12 - 46 %   Lymphs Abs 1.2 0.7 - 4.0 K/uL   Monocytes Relative 7 3 - 12 %   Monocytes Absolute 0.5 0.1 - 1.0 K/uL   Eosinophils Relative 7 (H) 0 - 5 %   Eosinophils Absolute 0.5 0.0 - 0.7 K/uL   Basophils Relative 1 0 - 1 %   Basophils Absolute 0.1 0.0 - 0.1 K/uL  Troponin I     Status: None   Collection Time: 08/24/14  3:45 AM  Result Value Ref Range   Troponin I 0.03 <0.031 ng/mL  I-stat troponin, ED     Status: None   Collection Time: 08/24/14  6:09 AM  Result Value Ref Range   Troponin i, poc 0.01 0.00 - 0.08 ng/mL   Comment 3           6:23 AM Patient states her pain is returning. We will have her admitted.    Wynetta Fines, MD 08/24/14 [phone]

## 2014-08-24 NOTE — ED Notes (Signed)
Having a difficult time gaining IV access, got enough blood for Istat.no current IV. Pt now states she is not having any pain. ERMD aware

## 2014-08-24 NOTE — Consult Note (Signed)
Reason for Consult:end-stage renal disease and hyperkalemia Referring Physician: Dr. Emogene Morgan TEAGANN CLEMENSEN is an 74 y.o. female.  HPI: she is a patient who has multiple medical problems including hypertension, coronary artery disease, end-stage renal disease on maintenance hemodialysis presently came with left-sided chest pain since last night. Patient had similar chest pain from before where she was evaluated. Patient however still that this one is a little bit different from before. Presently she feels good and the pain is gone. She denies any difficulty breathing.  Past Medical History  Diagnosis Date  . Chronic bronchitis   . GERD (gastroesophageal reflux disease)   . PUD (peptic ulcer disease)   . History of lower GI bleeding   . Arthritis   . History of gout   . CAD (coronary artery disease)     a. 12/2011 NSTEMI/Cath/PCI LCX (2.25x14 Resolute DES) & D1 (2.25x22 Resolute DES);  b. 01/2012 Cath/PCI: LM 30, LAD 30p, 40-39m, D1 stent ok, 99 in sm branch of diag, LCX patent stent, OM1 20, RCA 95 ost (4.0x12 Promus DES), EF 55%;  c. 04/2012 Lexi Cardiolite  EF 48%, small area of scar @ base/mid inflat wall with mild peri-infarct ischemia.; CABG 12/4  . High cholesterol 12/2011  . Pneumonia ~ 2009  . Iron deficiency anemia   . TIA (transient ischemic attack)   . Anxiety   . History of blood transfusion 07/2011; 12/2011; 01/2012 X 2; 04/2012  . Carotid artery disease     a. A999333 LICA, [id number]   . Mitral regurgitation     a. Moderate by echo, 02/2012  . Myocardial infarction   . Chronic diastolic CHF (congestive heart failure)     a. 02/2012 Echo EF 60-65%, nl wall motion, Gr 1 DD, mod MR  . Hypertension   . AVM (arteriovenous malformation) of colon   . Esophageal stricture   . Ovarian cancer 1992  . Colon cancer 1992  . ESRD on hemodialysis     ESRD due to HTN, started dialysis 2011 and gets HD at [facility] with Dr Hinda Lenis on MWF schedule.  Access is LUA AVF as of Sept 2014.      Past Surgical History  Procedure Laterality Date  . Abdominal hysterectomy  1992  . Appendectomy  06/1990  . Tubal ligation  1980's  . Av fistula placement  07/2009    left upper arm  . Thrombectomy / arteriovenous graft revision  2011    left upper arm  . Colon resection  1992  . Esophagogastroduodenoscopy  01/20/2012    Procedure: ESOPHAGOGASTRODUODENOSCOPY (EGD);  Surgeon: Ladene Artist, MD,FACG;  Location: [facility] ENDOSCOPY;  Service: Endoscopy;  Laterality: N/A;  . Dilation and curettage of uterus    . Coronary angioplasty with stent placement  12/15/11    "2"  . Coronary angioplasty with stent placement  y/2013    "1; makes total of 3" (05/02/2012)  . Coronary artery bypass graft  06/13/2012    Procedure: CORONARY ARTERY BYPASS GRAFTING (CABG);  Surgeon: Grace Isaac, MD;  Location: [place];  Service: Open Heart Surgery;  Laterality: N/A;  cabg x four;  using left internal mammary artery, and left leg greater saphenous vein harvested endoscopically  . Intraoperative transesophageal echocardiogram  06/13/2012    Procedure: INTRAOPERATIVE TRANSESOPHAGEAL ECHOCARDIOGRAM;  Surgeon: Grace Isaac, MD;  Location: [place];  Service: Open Heart Surgery;  Laterality: N/A;  . Esophagogastroduodenoscopy N/A 03/26/2013    Procedure: ESOPHAGOGASTRODUODENOSCOPY (EGD);  Surgeon: Irene Shipper, MD;  Location: [facility] ENDOSCOPY;  Service: Endoscopy;  Laterality: N/A;  . Ovary surgery      ovarian cancer  . Cardiac surgery    . Left heart catheterization with coronary angiogram N/A 12/15/2011    Procedure: LEFT HEART CATHETERIZATION WITH CORONARY ANGIOGRAM;  Surgeon: Burnell Blanks, MD;  Location: [facility] CATH LAB;  Service: Cardiovascular;  Laterality: N/A;  . Left heart catheterization with coronary angiogram N/A 01/10/2012    Procedure: LEFT HEART CATHETERIZATION WITH CORONARY ANGIOGRAM;  Surgeon: Peter M Martinique, MD;  Location: [facility] CATH LAB;  Service: Cardiovascular;  Laterality: N/A;  . Left heart  catheterization with coronary angiogram N/A 06/08/2012    Procedure: LEFT HEART CATHETERIZATION WITH CORONARY ANGIOGRAM;  Surgeon: Burnell Blanks, MD;  Location: [facility] CATH LAB;  Service: Cardiovascular;  Laterality: N/A;  . Shuntogram N/A 10/15/2013    Procedure: Fistulogram;  Surgeon: Serafina Mitchell, MD;  Location: [facility] CATH LAB;  Service: Cardiovascular;  Laterality: N/A;  . Left heart catheterization with coronary/graft angiogram N/A 12/10/2013    Procedure: LEFT HEART CATHETERIZATION WITH Beatrix Fetters;  Surgeon: [name], MD;  Location: [facility] CATH LAB;  Service: Cardiovascular;  Laterality: N/A;    Family History  Problem Relation Age of Onset  . Other      noncontributory for early CAD  . Heart disease Mother     Heart Disease before age 37  . Hyperlipidemia Mother   . Hypertension Mother   . Diabetes Mother   . Heart attack Mother   . Heart disease Father     Heart Disease before age 71  . Hyperlipidemia Father   . Hypertension Father   . Diabetes Father   . Diabetes Sister   . Hypertension Sister   . Diabetes Brother   . Hyperlipidemia Brother   . Heart attack Brother   . Colon cancer Neg Hx   . Esophageal cancer Neg Hx   . Liver disease Neg Hx   . Kidney disease Neg Hx   . Colon polyps Neg Hx   . Hypertension Sister   . Heart attack Brother     Social History:  reports that she has never smoked. She has never used smokeless tobacco. She reports that she does not drink alcohol or use illicit drugs.  Allergies:  Allergies  Allergen Reactions  . Aspirin Other (See Comments)    Mess up her stomach; "makes my bowels have blood in them". Takes 81 mg EC Aspirin   . Contrast Media [Iodinated Diagnostic Agents] Itching  . Iron Itching and Other (See Comments)    "they gave me iron in dialysis; had to give me Benadryl cause I had to have the iron" (05/02/2012)  . Macrodantin [Nitrofurantoin Macrocrystal] Other (See Comments)    "broke me out in big  old knots all over my body; had to go to ER"  . Penicillins Other (See Comments)    "makes me real weak when I take it; like I'll pass out"  . Plavix [Clopidogrel Bisulfate] Rash  . Bactrim [Sulfamethoxazole-Trimethoprim] Rash  . Sulfa Antibiotics Rash  . Venofer [Ferric Oxide] Itching    Patient reports using Benadryl prior to doses as [facility]  . Dexilant [Dexlansoprazole] Other (See Comments)    Upset stomach  . Prilosec [Omeprazole] Other (See Comments)    "back spasms"  . Levaquin [Levofloxacin In D5w] Rash  . Protonix [Pantoprazole Sodium] Rash    Medications: I have reviewed the patient's current medications.  Results for orders placed  or performed during the hospital encounter of 08/24/14 (from the past 48 hour(s))  I-stat troponin, ED     Status: None   Collection Time: 08/24/14  1:48 AM  Result Value Ref Range   Troponin i, poc 0.00 0.00 - 0.08 ng/mL   Comment 3            Comment: Due to the release kinetics of cTnI, a negative result within the first hours of the onset of symptoms does not rule out myocardial infarction with certainty. If myocardial infarction is still suspected, repeat the test at appropriate intervals.   I-stat chem 8, ed     Status: Abnormal   Collection Time: 08/24/14  1:49 AM  Result Value Ref Range   Sodium 137 135 - 145 mmol/L   Potassium 6.1 (HH) 3.5 - 5.1 mmol/L   Chloride 104 96 - 112 mmol/L   BUN 74 (H) 6 - 23 mg/dL   Creatinine, Ser 9.70 (H) 0.50 - 1.10 mg/dL   Glucose, Bld 88 70 - 99 mg/dL   Calcium, Ion 1.08 (L) 1.13 - 1.30 mmol/L   TCO2 24 0 - 100 mmol/L   Hemoglobin 13.3 12.0 - 15.0 g/dL   HCT 39.0 36.0 - 46.0 %  CBC with Differential/Platelet     Status: Abnormal   Collection Time: 08/24/14  2:17 AM  Result Value Ref Range   WBC 7.6 4.0 - 10.5 K/uL   RBC 3.37 (L) 3.87 - 5.11 MIL/uL   Hemoglobin 11.1 (L) 12.0 - 15.0 g/dL   HCT 34.8 (L) 36.0 - 46.0 %   MCV 103.3 (H) 78.0 - 100.0 fL   MCH 32.9 26.0 - 34.0 pg   MCHC  31.9 30.0 - 36.0 g/dL   RDW 15.9 (H) 11.5 - 15.5 %   Platelets 228 150 - 400 K/uL   Neutrophils Relative % 69 43 - 77 %   Neutro Abs 5.3 1.7 - 7.7 K/uL   Lymphocytes Relative 16 12 - 46 %   Lymphs Abs 1.2 0.7 - 4.0 K/uL   Monocytes Relative 7 3 - 12 %   Monocytes Absolute 0.5 0.1 - 1.0 K/uL   Eosinophils Relative 7 (H) 0 - 5 %   Eosinophils Absolute 0.5 0.0 - 0.7 K/uL   Basophils Relative 1 0 - 1 %   Basophils Absolute 0.1 0.0 - 0.1 K/uL  Troponin I     Status: None   Collection Time: 08/24/14  3:45 AM  Result Value Ref Range   Troponin I 0.03 <0.031 ng/mL    Comment:        NO INDICATION OF MYOCARDIAL INJURY.   I-stat troponin, ED     Status: None   Collection Time: 08/24/14  6:09 AM  Result Value Ref Range   Troponin i, poc 0.01 0.00 - 0.08 ng/mL   Comment 3            Comment: Due to the release kinetics of cTnI, a negative result within the first hours of the onset of symptoms does not rule out myocardial infarction with certainty. If myocardial infarction is still suspected, repeat the test at appropriate intervals.     No results found.  Review of Systems  Respiratory: Negative for shortness of breath.   Cardiovascular: Positive for chest pain. Negative for orthopnea, claudication and leg swelling.       Patient with left-sided chest pain, described as heaviness associated with nausea but no vomiting. The pain radiates to her left shoulder and  last a couple of minutes. Patient also has some palpitations during that time. Presently she said that the pain has gone.  Gastrointestinal: Positive for nausea. Negative for vomiting.  Neurological: Negative for weakness.   Blood pressure 172/60, pulse 67, temperature 97.9 F (36.6 C), temperature source Oral, resp. rate 14, height 5\' 1"  (1.549 m), weight 67.1 kg (147 lb 14.9 oz), SpO2 99 %. Physical Exam  Constitutional: No distress.  Eyes: No scleral icterus.  Neck: No JVD present.  Cardiovascular: Normal rate and  regular rhythm.   Respiratory: No respiratory distress. She has no wheezes. She has no rales.  GI: There is no tenderness.  Musculoskeletal: She exhibits no edema.    Assessment/Plan: Problem #1 chest pain: Possibly noncardiac. However patient has multiple risk factors including hypertension, end-stage renal disease, previous history of MI. Presently her pain has improved. Problem #2 end-stage renal disease: She is status post hemodialysis on Friday. Presently she doesn't have any uremic sinus symptoms. Her potassium however is 6.1 high. Problem #3 anemia: Her hemoglobin and hematocrit is within our target goal Problem #4 hypertension: Her blood pressures for which we think has wise reasonably controlled Problem #5 metabolic bone disease: Her calcium is 0 range Problem #7 fluid management: Presently patient doesn't have any sign of fluid overload. Problem #8 history of anxiety disorder Plan: We'll make arrangements for patient to get dialysis today because of hyperkalemia. We'll use 2K/2.5 calcium bath for 3-1/2 hours. We'll remove about one and half liters. We'll check her basic metabolic panel in the morning. We'll use Epogen 4,000 units IV after each dialysis.   Irine Heminger S 08/24/2014, 7:56 AM

## 2014-08-24 NOTE — ED Notes (Signed)
dozing

## 2014-08-25 ENCOUNTER — Inpatient Hospital Stay (HOSPITAL_COMMUNITY): Payer: Medicare Other

## 2014-08-25 ENCOUNTER — Encounter (HOSPITAL_COMMUNITY): Payer: Self-pay

## 2014-08-25 ENCOUNTER — Ambulatory Visit: Payer: Self-pay | Admitting: Cardiovascular Disease

## 2014-08-25 DIAGNOSIS — R072 Precordial pain: Secondary | ICD-10-CM

## 2014-08-25 DIAGNOSIS — R079 Chest pain, unspecified: Secondary | ICD-10-CM | POA: Insufficient documentation

## 2014-08-25 DIAGNOSIS — N185 Chronic kidney disease, stage 5: Secondary | ICD-10-CM

## 2014-08-25 LAB — BASIC METABOLIC PANEL
Anion gap: 12 (ref 5–15)
BUN: 40 mg/dL — AB (ref 6–23)
CO2: 30 mmol/L (ref 19–32)
Calcium: 8.7 mg/dL (ref 8.4–10.5)
Chloride: 97 mmol/L (ref 96–112)
Creatinine, Ser: 7.05 mg/dL — ABNORMAL HIGH (ref 0.50–1.10)
GFR calc Af Amer: 6 mL/min — ABNORMAL LOW (ref >90)
GFR calc non Af Amer: 5 mL/min — ABNORMAL LOW (ref >90)
Glucose, Bld: 109 mg/dL — ABNORMAL HIGH (ref 70–99)
POTASSIUM: 4.8 mmol/L (ref 3.5–5.1)
SODIUM: 139 mmol/L (ref 135–145)

## 2014-08-25 LAB — CBC
HCT: 33.4 % — ABNORMAL LOW (ref 36.0–46.0)
Hemoglobin: 10.5 g/dL — ABNORMAL LOW (ref 12.0–15.0)
MCH: 32.9 pg (ref 26.0–34.0)
MCHC: 31.4 g/dL (ref 30.0–36.0)
MCV: 104.7 fL — ABNORMAL HIGH (ref 78.0–100.0)
Platelets: 232 10*3/uL (ref 150–400)
RBC: 3.19 MIL/uL — ABNORMAL LOW (ref 3.87–5.11)
RDW: 15.8 % — ABNORMAL HIGH (ref 11.5–15.5)
WBC: 6.1 10*3/uL (ref 4.0–10.5)

## 2014-08-25 IMAGING — NM NM MYOCAR MULTI W/SPECT W/WALL MOTION & EF
2 series · 12 of 12 positions shown · non-contrast
Comparison: none

CLINICAL DATA: Chest pain

EXAM:
Lexiscan Myovue
TECHNIQUE: The patient received IV Lexiscan .4mg over 15 seconds. 33.0 mCi of
Technetium 99m Sestamibi injected at 30 seconds. Quantitative SPECT
images were obtained in the vertical, horizontal and short axis
planes after a 45 minute delay. Rest images were obtained with
similar planes and delay using 10.2 mCi of Technetium 99m Sestamibi.

[Series 1: rest · 8.28mm/px · 6 of 64 frames shown]
[frame 6/64]
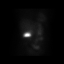
[frame 16/64]
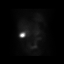
[frame 27/64]
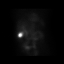
[frame 38/64]
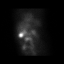
[frame 48/64]
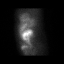
[frame 59/64]
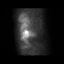

[Series 3: stress gated - perfusion · 8.28mm/px · 6 of 64 frames shown]
[frame 6/64]
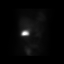
[frame 16/64]
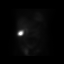
[frame 27/64]
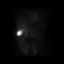
[frame 38/64]
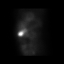
[frame 48/64]
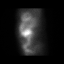
[frame 59/64]
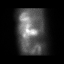

[12 of 12 positions shown; findings below may reference images not displayed]

FINDINGS: ECG:  LE HUYEN LVH with strain

Symptoms:  Headache abdominal pain and dyspnea

RAW Data: Normal

QPS: Normal

Quantitative Gated SPECT EF:  57% no RWMA;s

Perfusion Images: Normal
IMPRESSION: Normal Lexiscan myovue with no ischemia or infarct EF 57%

LE HUYEN

## 2014-08-25 MED ORDER — ACETAMINOPHEN 325 MG PO TABS
325.0000 mg | ORAL_TABLET | Freq: Four times a day (QID) | ORAL | Status: DC | PRN
  Administered 2014-08-25: 325 mg via ORAL

## 2014-08-25 MED ORDER — SODIUM CHLORIDE 0.9 % IJ SOLN
INTRAMUSCULAR | Status: AC
  Administered 2014-08-25: 10 mL via INTRAVENOUS
  Filled 2014-08-25: qty 3

## 2014-08-25 MED ORDER — REGADENOSON 0.4 MG/5ML IV SOLN
INTRAVENOUS | Status: AC
  Administered 2014-08-25: 0.4 mg via INTRAVENOUS
  Filled 2014-08-25: qty 5

## 2014-08-25 MED ORDER — TECHNETIUM TC 99M SESTAMIBI - CARDIOLITE
10.0000 | Freq: Once | INTRAVENOUS | Status: AC | PRN
  Administered 2014-08-25: 10 via INTRAVENOUS

## 2014-08-25 MED ORDER — TECHNETIUM TC 99M SESTAMIBI GENERIC - CARDIOLITE
30.0000 | Freq: Once | INTRAVENOUS | Status: AC | PRN
  Administered 2014-08-25: 30 via INTRAVENOUS

## 2014-08-25 MED ORDER — SODIUM CHLORIDE 0.9 % IJ SOLN
10.0000 mL | INTRAMUSCULAR | Status: DC | PRN
  Administered 2014-08-25: 10 mL via INTRAVENOUS
  Filled 2014-08-25: qty 10

## 2014-08-25 NOTE — Progress Notes (Signed)
Stress Lab Nurses Notes - Shawna [name]  Shawna [name] 08/25/2014 Reason for doing test: CAD and Chest Pain Type of test: Wille Glaser / Inpatient Rm 307 Nurse performing test: Gerrit Halls, RN Nuclear Medicine Tech: Melburn Hake Echo Tech: Not Applicable MD performing test: P. Johnsie Cancel Family MD: Nadara Mustard Test explained and consent signed: Yes.   IV started: Saline lock flushed, No redness or edema and Saline lock from floor Symptoms: stomach discomfort and headache Treatment/Intervention: None Reason test stopped: protocol completed After recovery IV was: No redness or edema and Saline Lock flushed Patient to return to [name]. Med at : 10:30 Patient discharged: Transported back to room 307 via wc Patient's Condition upon discharge was: stable Comments: During test peak BP 194/81 & HR 104.  Recovery BP 185/77 & HR 86.  Continues to have headache & stomach discomfort. Geanie Cooley T

## 2014-08-25 NOTE — Discharge Summary (Signed)
Physician Discharge Summary  Shawna [name] [id number] DOB: 1941-03-21 DOA: 08/24/2014  PCP: [name], MD  Admit date: 08/24/2014 Discharge date: 08/25/2014  Time spent: 40 minutes  Recommendations for Outpatient Follow-up:  1. Follow up with Dr Jacinta Shoe 09/16/14 as follow up to CP rule out admission and stress test results  Discharge Diagnoses:  Principal Problem:   Chest pain Active Problems:   Coronary atherosclerosis of native coronary artery   Essential hypertension, benign   ESRD on hemodialysis   HLD (hyperlipidemia)   Anemia of chronic kidney failure   Chronic diastolic CHF (congestive heart failure)   Hyperkalemia   Discharge Condition: stable  Diet recommendation: heart healthy  Filed Weights   08/24/14 0739 08/24/14 1115 08/24/14 1445  Weight: 67.1 kg (147 lb 14.9 oz) 68.4 kg (150 lb 12.7 oz) 66.8 kg (147 lb 4.3 oz)    History of present illness:  Patient is a 74 y/o woman with h/o CAD s/p CABG, HTN, HLD, chronic diastolic CHF, GERD and ESRD on HD who presented to ED on 08/24/14 with CP. She stated CP began after dinner pm prior while lying in bed. She described pain as a "soreness" over her left breast. She took an antacid and went back to bed. Was woken up with persistent pain that  had radiated into her left shoulder. Pain was similar to when she had her MI, so she became concerned and had her husband bring her to the ED. Work up showed a negative troponin, EKG showed LVH with repol abnormalities. Noted to have a K of 6.1.  Hospital Course:  Chest Pain -Ruled out for ACS.vEKG without acute abnormalities. Has CAD and is s/p CABG. --Last cardiac catheterization on 12/10/2013 revealing patent left main coronary artery, with focal significant disease in the mid and distal left anterior descending artery, patent diagonal branch LIMA to LAD, widely patent, severe restenosis of the proximal left circumflex artery, stent placement SVG to circumflex 70% proximal ramus  stenosis widely patent SVG to ramus. Occlusive the ostium of the right coronary artery, widely patent SVG to distal RCA. -evaluated by cardiology who recommended myovue that was negative. Recommended continuing asa, beta blocker and nitrates. Will follow up with Dr Jacinta Shoe 09/14/14  Hyperkalemia -For HD 08/24/14 Regular schedule is MWF. -evaluated by nephrology who recommends resumption of OP schedule -potassium 4.8 at discharge  ESRD -On HD. -.dialysis 08/24/14 -evaluated by nephrology who recommends resumption of OP schedule  Chronic Diastolic CHF - remained Compensated. -Not on ACE/ARB due to CKD.  HLD -Continue statin. Fair control.  HTN - controlled. Continue home meds.   Procedures: Normal Lexiscan myovue with no ischemia or infarct EF 57% 08/24/14   Consultations:  Dr Jenna Luo cardiology  Dr befakadu nephrology  Discharge Exam: Filed Vitals:   08/25/14 1136  BP: 162/61  Pulse: 80  Temp:   Resp: 18    General: well nourished appears comfortable smiling Cardiovascular: RRR no MGR No LE edema Respiratory: normal effort BS clear bilaterally no wheeze  Discharge Instructions    Current Discharge Medication List    CONTINUE these medications which have NOT CHANGED   Details  ALPRAZolam (XANAX) 0.5 MG tablet Take 0.5 mg by mouth 3 (three) times daily.    amLODipine (NORVASC) 10 MG tablet Take 1 tablet (10 mg total) by mouth daily. Qty: 30 tablet, Refills: 6    aspirin EC 81 MG tablet Take 81 mg by mouth daily.    cloNIDine (CATAPRES) 0.2 MG tablet Take 1 tablet (0.2  mg total) by mouth 2 (two) times daily. Qty: 60 tablet, Refills: 11    fluticasone (FLONASE) 50 MCG/ACT nasal spray Place 2 sprays into the nose daily as needed for allergies.     folic acid-vitamin b complex-vitamin c-selenium-zinc (DIALYVITE) 3 MG TABS Take 1 tablet by mouth daily.    guaifenesin (ROBITUSSIN) 100 MG/5ML syrup Take 100 mg by mouth daily as needed for cough.     isosorbide dinitrate (ISORDIL) 10 MG tablet Take 1 tablet (10 mg total) by mouth 3 (three) times daily. Qty: 90 tablet, Refills: 10    labetalol (NORMODYNE) 100 MG tablet Take 1.5 tablets (150 mg total) by mouth 2 (two) times daily. Qty: 120 tablet, Refills: 6    lanthanum (FOSRENOL) 1000 MG chewable tablet Chew 1,000 mg by mouth 3 (three) times daily after meals.    loratadine (CLARITIN) 10 MG tablet Take 10 mg by mouth daily.     nitroGLYCERIN (NITROSTAT) 0.4 MG SL tablet Place 1 tablet (0.4 mg total) under the tongue every 5 (five) minutes x 3 doses as needed for chest pain. Qty: 25 tablet, Refills: 3    omeprazole (PRILOSEC) 20 MG capsule Take 1 capsule (20 mg total) by mouth daily. Qty: 30 capsule, Refills: 11    ondansetron (ZOFRAN) 4 MG tablet Take 1 tablet (4 mg total) by mouth every 6 (six) hours. Qty: 12 tablet, Refills: 0    SENSIPAR 30 MG tablet Take 30 mg by mouth daily with supper.     simvastatin (ZOCOR) 20 MG tablet Take 1 tablet by mouth at bedtime.     tetrahydrozoline 0.05 % ophthalmic solution Place 1 drop into both eyes 2 (two) times daily as needed (irritation).    zolpidem (AMBIEN) 10 MG tablet Take 10 mg by mouth at bedtime as needed for sleep.        Allergies  Allergen Reactions  . Aspirin Other (See Comments)    Mess up her stomach; "makes my bowels have blood in them". Takes 81 mg EC Aspirin   . Contrast Media [Iodinated Diagnostic Agents] Itching  . Iron Itching and Other (See Comments)    "they gave me iron in dialysis; had to give me Benadryl cause I had to have the iron" (05/02/2012)  . Macrodantin [Nitrofurantoin Macrocrystal] Other (See Comments)    "broke me out in big old knots all over my body; had to go to ER"  . Penicillins Other (See Comments)    "makes me real weak when I take it; like I'll pass out"  . Plavix [Clopidogrel Bisulfate] Rash  . Bactrim [Sulfamethoxazole-Trimethoprim] Rash  . Sulfa Antibiotics Rash  . Venofer [Ferric  Oxide] Itching    Patient reports using Benadryl prior to doses as [facility]  . Dexilant [Dexlansoprazole] Other (See Comments)    Upset stomach  . Prilosec [Omeprazole] Other (See Comments)    "back spasms"  . Levaquin [Levofloxacin In D5w] Rash  . Protonix [Pantoprazole Sodium] Rash   Follow-up Information    Follow up with Herminio Commons, MD On 09/16/2014.   Specialty:  Cardiology   Why:  appointment 4:20pm   Contact information:   [place] [id number] [phone]        The results of significant diagnostics from this hospitalization (including imaging, microbiology, ancillary and laboratory) are listed below for reference.    Significant Diagnostic Studies: Nm Myocar Multi W/spect W/wall Motion / Ef  08/25/2014   CLINICAL DATA:  Chest pain  EXAM: Lexiscan Myovue  TECHNIQUE: The patient received IV Lexiscan .4mg  over 15 seconds. 33.0 mCi of Technetium 78m Sestamibi injected at 30 seconds. Quantitative SPECT images were obtained in the vertical, horizontal and short axis planes after a 45 minute delay. Rest images were obtained with similar planes and delay using 10.2 mCi of Technetium 70m Sestamibi.  FINDINGS: ECG:  NSR LVH with strain  Symptoms:  Headache abdominal pain and dyspnea  RAW Data: Normal  QPS: Normal  Quantitative Gated SPECT EF:  57% no RWMA;s  Perfusion Images: Normal  IMPRESSION: Normal Lexiscan myovue with no ischemia or infarct EF 57%  Jenkins Rouge   Electronically Signed   By: Jenkins Rouge M.D.   On: 08/25/2014 12:18    Microbiology: No results found for this or any previous visit (from the past 240 hour(s)).   Labs: Basic Metabolic Panel:  Recent Labs Lab 08/24/14 0149 08/25/14 0549  NA 137 139  K 6.1* 4.8  CL 104 97  CO2  --  30  GLUCOSE 88 109*  BUN 74* 40*  CREATININE 9.70* 7.05*  CALCIUM  --  8.7   Liver Function Tests: No results for input(s): AST, ALT, ALKPHOS, BILITOT, PROT, ALBUMIN in the last 168  hours. No results for input(s): LIPASE, AMYLASE in the last 168 hours. No results for input(s): AMMONIA in the last 168 hours. CBC:  Recent Labs Lab 08/24/14 0149 08/24/14 0217 08/25/14 0549  WBC  --  7.6 6.1  NEUTROABS  --  5.3  --   HGB 13.3 11.1* 10.5*  HCT 39.0 34.8* 33.4*  MCV  --  103.3* 104.7*  PLT  --  228 232   Cardiac Enzymes:  Recent Labs Lab 08/24/14 0345 08/24/14 1000 08/24/14 1638 08/24/14 2130  TROPONINI 0.03 <0.03 <0.03 <0.03   BNP: BNP (last 3 results) No results for input(s): BNP in the last 8760 hours.  ProBNP (last 3 results) No results for input(s): PROBNP in the last 8760 hours.  CBG: No results for input(s): GLUCAP in the last 168 hours.     SignedRadene Gunning  [facility] 08/25/2014, 1:57 PM

## 2014-08-25 NOTE — Care Management Note (Signed)
UR completed 

## 2014-08-25 NOTE — Progress Notes (Signed)
Subjective: Interval History: has no complaint of nausea or vomiting. Presently she doesn't have any chest pain. Patient complains of intermittent headache..  Objective: Vital signs in last 24 hours: Temp:  [97.9 F (36.6 C)-98.4 F (36.9 C)] 98.3 F (36.8 C) (02/15 0532) Pulse Rate:  [62-76] 63 (02/15 0532) Resp:  [18-20] 18 (02/15 0532) BP: (140-204)/(57-79) 140/57 mmHg (02/15 0532) SpO2:  [95 %-99 %] 95 % (02/15 0532) Weight:  [66.8 kg (147 lb 4.3 oz)-68.4 kg (150 lb 12.7 oz)] 66.8 kg (147 lb 4.3 oz) (02/14 1445) Weight change: 0.875 kg (1 lb 14.9 oz)  Intake/Output from previous day: 02/14 0701 - 02/15 0700 In: 483 [P.O.:480; I.V.:3] Out: 1750  Intake/Output this shift:    General appearance: alert, cooperative and no distress Resp: clear to auscultation bilaterally Cardio: regular rate and rhythm, S1, S2 normal, no murmur, click, rub or gallop GI: soft, non-tender; bowel sounds normal; no masses,  no organomegaly Extremities: extremities normal, atraumatic, no cyanosis or edema  Lab Results:  Recent Labs  08/24/14 0217 08/25/14 0549  WBC 7.6 6.1  HGB 11.1* 10.5*  HCT 34.8* 33.4*  PLT 228 232   BMET:  Recent Labs  08/24/14 0149 08/25/14 0549  NA 137 139  K 6.1* 4.8  CL 104 97  CO2  --  30  GLUCOSE 88 109*  BUN 74* 40*  CREATININE 9.70* 7.05*  CALCIUM  --  8.7   No results for input(s): PTH in the last 72 hours. Iron Studies: No results for input(s): IRON, TIBC, TRANSFERRIN, FERRITIN in the last 72 hours.  Studies/Results: No results found.  I have reviewed the patient's current medications.  Assessment/Plan: Problem #1 hyperkalemia: Potassium is 4.8 corrected. She is status post hemodialysis yesterday. Ex problem #2 chest pain: Presently has improved. Problem #3 end-stage renal disease: She is status post hemodialysis yesterday presently she doesn't have any uremic sinus symptoms. Problem #4 hypertension: Her blood pressure fluctuates but  reasonably controlled Problem #5 anemia: Her hemoglobin is within or target range Problem #6 metabolic bone disease: Her calcium is range Problem #7 history of anxiety disorder Plan: We'll make arrangements for patient to get dialysis tomorrow. We'll continue his present management We'll check her CBC, basic metabolic panel and phosphorus in the morning.   LOS: 1 day   Brantlee Penn S 08/25/2014,8:43 AM

## 2014-08-25 NOTE — Progress Notes (Signed)
Pt's IV catheter removed and intact. IV catheter site clean dry and intact. Pt's discharge instructions reviewed and discussed. All follow up appointments discussed. All questions were answered. No further questions at this time. Pt will be escorted by nurse tech.

## 2014-08-25 NOTE — Progress Notes (Signed)
Protonix on patient's list of allergies. Pt refused dose of protonix and was not given scheduled dose of medication.

## 2014-08-25 NOTE — Consult Note (Signed)
CARDIOLOGY CONSULT NOTE       Patient ID: Shawna [name] MRN: [id number] DOB/AGE: [birth year] 75 y.o.  Admit date: 08/24/2014 Referring Physician:  Jerilee Hoh Primary Physician: [name], MD Primary Cardiologist:  Myrtie Neither Reason for Consultation:  Chest pain  Principal Problem:   Chest pain Active Problems:   Coronary atherosclerosis of native coronary artery   Essential hypertension, benign   ESRD on hemodialysis   HLD (hyperlipidemia)   Anemia of chronic kidney failure   Chronic diastolic CHF (congestive heart failure)   Hyperkalemia   HPI:   Shawna [name] is a 74 y.o. patient of Dr. Pennelope Bracken  Admitted with chest pain She is status post coronary artery bypass grafting, chronic diastolic CHF and hypertension with end-stage renal disease. Patient was last seen in the office in July of 2015. This is status post cardiac catheterization on 12/10/2013 revealing patent left main coronary artery, with focal significant disease in the mid and distal left anterior descending artery, patent diagonal branch LIMA to LAD, widely patent, severe restenosis of the proximal left circumflex artery, stent placement SVG to circumflex 70% proximal ramus stenosis widely patent SVG to ramus. Occlusive the ostium of the right coronary artery, widely patent SVG to distal RCA, LVEF was not assessed. Echo 2015  EF normal 55-60%  Admitted with atypical SSCP  Rest  Over left breast.  Some relief with antacid but returned  ECG with IVCD and LVH  CPK negative    Performed Lexiscan myovue on patient this am Images pending   ROS All other systems reviewed and negative except as noted above  Past Medical History  Diagnosis Date  . Chronic bronchitis   . GERD (gastroesophageal reflux disease)   . PUD (peptic ulcer disease)   . History of lower GI bleeding   . Arthritis   . History of gout   . CAD (coronary artery disease)     a. 12/2011 NSTEMI/Cath/PCI LCX (2.25x14 Resolute DES) & D1 (2.25x22 Resolute  DES);  b. 01/2012 Cath/PCI: LM 30, LAD 30p, 40-50m, D1 stent ok, 99 in sm branch of diag, LCX patent stent, OM1 20, RCA 95 ost (4.0x12 Promus DES), EF 55%;  c. 04/2012 Lexi Cardiolite  EF 48%, small area of scar @ base/mid inflat wall with mild peri-infarct ischemia.; CABG 12/4  . High cholesterol 12/2011  . Pneumonia ~ 2009  . Iron deficiency anemia   . TIA (transient ischemic attack)   . Anxiety   . History of blood transfusion 07/2011; 12/2011; 01/2012 X 2; 04/2012  . Carotid artery disease     a. [id number] LICA, [id number]   . Mitral regurgitation     a. Moderate by echo, 02/2012  . Myocardial infarction   . Chronic diastolic CHF (congestive heart failure)     a. 02/2012 Echo EF 60-65%, nl wall motion, Gr 1 DD, mod MR  . Hypertension   . AVM (arteriovenous malformation) of colon   . Esophageal stricture   . Ovarian cancer 1992  . Colon cancer 1992  . ESRD on hemodialysis     ESRD due to HTN, started dialysis 2011 and gets HD at [facility] with Dr Hinda Lenis on MWF schedule.  Access is LUA AVF as of Sept 2014.     Family History  Problem Relation Age of Onset  . Other      noncontributory for early CAD  . Heart disease Mother     Heart Disease before age 83  . Hyperlipidemia Mother   . Hypertension Mother   .  Diabetes Mother   . Heart attack Mother   . Heart disease Father     Heart Disease before age 42  . Hyperlipidemia Father   . Hypertension Father   . Diabetes Father   . Diabetes Sister   . Hypertension Sister   . Diabetes Brother   . Hyperlipidemia Brother   . Heart attack Brother   . Colon cancer Neg Hx   . Esophageal cancer Neg Hx   . Liver disease Neg Hx   . Kidney disease Neg Hx   . Colon polyps Neg Hx   . Hypertension Sister   . Heart attack Brother     History   Social History  . Marital Status: Married    Spouse Name: N/A  . Number of Children: N/A  . Years of Education: N/A   Occupational History  . Not on file.   Social History Main Topics  . Smoking  status: Never Smoker   . Smokeless tobacco: Never Used  . Alcohol Use: No  . Drug Use: No  . Sexual Activity: Yes    Birth Control/ Protection: Surgical   Other Topics Concern  . Not on file   Social History Narrative   Lives in [place], [place] with husband.  Dialysis pt - mwf.    Past Surgical History  Procedure Laterality Date  . Abdominal hysterectomy  1992  . Appendectomy  06/1990  . Tubal ligation  1980's  . Av fistula placement  07/2009    left upper arm  . Thrombectomy / arteriovenous graft revision  2011    left upper arm  . Colon resection  1992  . Esophagogastroduodenoscopy  01/20/2012    Procedure: ESOPHAGOGASTRODUODENOSCOPY (EGD);  Surgeon: Ladene Artist, MD,FACG;  Location: [facility] ENDOSCOPY;  Service: Endoscopy;  Laterality: N/A;  . Dilation and curettage of uterus    . Coronary angioplasty with stent placement  12/15/11    "2"  . Coronary angioplasty with stent placement  y/2013    "1; makes total of 3" (05/02/2012)  . Coronary artery bypass graft  06/13/2012    Procedure: CORONARY ARTERY BYPASS GRAFTING (CABG);  Surgeon: Grace Isaac, MD;  Location: [facility];  Service: Open Heart Surgery;  Laterality: N/A;  cabg x four;  using left internal mammary artery, and left leg greater saphenous vein harvested endoscopically  . Intraoperative transesophageal echocardiogram  06/13/2012    Procedure: INTRAOPERATIVE TRANSESOPHAGEAL ECHOCARDIOGRAM;  Surgeon: Grace Isaac, MD;  Location: [facility];  Service: Open Heart Surgery;  Laterality: N/A;  . Esophagogastroduodenoscopy N/A 03/26/2013    Procedure: ESOPHAGOGASTRODUODENOSCOPY (EGD);  Surgeon: Irene Shipper, MD;  Location: [facility] ENDOSCOPY;  Service: Endoscopy;  Laterality: N/A;  . Ovary surgery      ovarian cancer  . Cardiac surgery    . Left heart catheterization with coronary angiogram N/A 12/15/2011    Procedure: LEFT HEART CATHETERIZATION WITH CORONARY ANGIOGRAM;  Surgeon: Burnell Blanks, MD;  Location: [facility] CATH LAB;  Service:  Cardiovascular;  Laterality: N/A;  . Left heart catheterization with coronary angiogram N/A 01/10/2012    Procedure: LEFT HEART CATHETERIZATION WITH CORONARY ANGIOGRAM;  Surgeon: Bellamarie Pflug M Martinique, MD;  Location: [facility] CATH LAB;  Service: Cardiovascular;  Laterality: N/A;  . Left heart catheterization with coronary angiogram N/A 06/08/2012    Procedure: LEFT HEART CATHETERIZATION WITH CORONARY ANGIOGRAM;  Surgeon: Burnell Blanks, MD;  Location: [facility] CATH LAB;  Service: Cardiovascular;  Laterality: N/A;  . Shuntogram N/A 10/15/2013    Procedure: Fistulogram;  Surgeon: Serafina Mitchell, MD;  Location: [facility] CATH LAB;  Service: Cardiovascular;  Laterality: N/A;  . Left heart catheterization with coronary/graft angiogram N/A 12/10/2013    Procedure: LEFT HEART CATHETERIZATION WITH Beatrix Fetters;  Surgeon: [name], MD;  Location: [facility] CATH LAB;  Service: Cardiovascular;  Laterality: N/A;     . ALPRAZolam  0.5 mg Oral TID  . amLODipine  10 mg Oral Daily  . aspirin EC  81 mg Oral Daily  . cinacalcet  30 mg Oral Q supper  . cloNIDine  0.2 mg Oral BID  . epoetin (EPOGEN/PROCRIT) injection  4,000 Units Subcutaneous Once  . heparin  5,000 Units Subcutaneous 3 times per day  . isosorbide dinitrate  10 mg Oral TID  . labetalol  150 mg Oral BID  . lanthanum  1,000 mg Oral TID WC  . loratadine  10 mg Oral Daily  . multivitamin   Oral Daily  . pantoprazole  40 mg Oral Daily  . simvastatin  20 mg Oral Daily  . sodium chloride  3 mL Intravenous Q12H  . sodium chloride  3 mL Intravenous Q12H      Physical Exam: Blood pressure 140/57, pulse 63, temperature 98.3 F (36.8 C), temperature source Oral, resp. rate 18, height 5\' 1"  (1.549 m), weight 66.8 kg (147 lb 4.3 oz), SpO2 95 %.    Affect appropriate Pleasant chronically ill black female  HEENT: normal Neck supple with no adenopathy JVP normal no bruits no thyromegaly Lungs clear with no wheezing and good diaphragmatic motion Heart:   S1/S2 no murmur, no rub, gallop or click PMI normal Abdomen: benighn, BS positve, no tenderness, no AAA previous hysterectomy no bruit.  No HSM or HJR Fistula LUE  No edema Neuro non-focal Skin warm and dry No muscular weakness   Labs:   Lab Results  Component Value Date   WBC 6.1 08/25/2014   HGB 10.5* 08/25/2014   HCT 33.4* 08/25/2014   MCV 104.7* 08/25/2014   PLT 232 08/25/2014    Recent Labs Lab 08/25/14 0549  NA 139  K 4.8  CL 97  CO2 30  BUN 40*  CREATININE 7.05*  CALCIUM 8.7  GLUCOSE 109*   Lab Results  Component Value Date   CKTOTAL 100 09/06/2012   CKMB 3.9 09/06/2012   TROPONINI <0.03 08/24/2014    Lab Results  Component Value Date   CHOL 129 07/07/2012   CHOL 210* 12/16/2011   Lab Results  Component Value Date   HDL 51 07/07/2012   HDL 83 12/16/2011   Lab Results  Component Value Date   LDLCALC 59 07/07/2012   LDLCALC 114* 12/16/2011   Lab Results  Component Value Date   TRIG 97 07/07/2012   TRIG 133 01/12/2012   TRIG 64 12/16/2011   Lab Results  Component Value Date   CHOLHDL 2.5 07/07/2012   CHOLHDL 2.5 12/16/2011   No results found for: LDLDIRECT    Radiology: No results found.  EKG:  SR IVCD LVH   ASSESSMENT AND PLAN:  Chest Pain:  Distant CABG  Stent to SVG circumflex 6/15  Lexiscan myovue done this am images pending  Continue asa, beta blocker and nitrates  HTN:  On multiple meds Reasonable control  No hypotension during dialysis CRF:  Dialysis per nephrology  Possible d/c latter today if myovue low risk  Signed: Jenkins Rouge 08/25/2014, 9:48 AM

## 2014-09-16 ENCOUNTER — Encounter: Payer: Self-pay | Admitting: *Deleted

## 2014-09-16 ENCOUNTER — Ambulatory Visit (INDEPENDENT_AMBULATORY_CARE_PROVIDER_SITE_OTHER): Payer: Medicare Other | Admitting: Cardiovascular Disease

## 2014-09-16 ENCOUNTER — Encounter: Payer: Self-pay | Admitting: Cardiovascular Disease

## 2014-09-16 VITALS — BP 180/92 | HR 76 | Ht 61.0 in | Wt 150.0 lb

## 2014-09-16 DIAGNOSIS — Z87898 Personal history of other specified conditions: Secondary | ICD-10-CM

## 2014-09-16 DIAGNOSIS — I25768 Atherosclerosis of bypass graft of coronary artery of transplanted heart with other forms of angina pectoris: Secondary | ICD-10-CM

## 2014-09-16 DIAGNOSIS — I10 Essential (primary) hypertension: Secondary | ICD-10-CM

## 2014-09-16 DIAGNOSIS — N186 End stage renal disease: Secondary | ICD-10-CM

## 2014-09-16 DIAGNOSIS — I779 Disorder of arteries and arterioles, unspecified: Secondary | ICD-10-CM

## 2014-09-16 DIAGNOSIS — I739 Peripheral vascular disease, unspecified: Secondary | ICD-10-CM

## 2014-09-16 DIAGNOSIS — I5032 Chronic diastolic (congestive) heart failure: Secondary | ICD-10-CM

## 2014-09-16 DIAGNOSIS — Z9289 Personal history of other medical treatment: Secondary | ICD-10-CM

## 2014-09-16 MED ORDER — HYDRALAZINE HCL 25 MG PO TABS: 25.0000 mg | ORAL_TABLET | Freq: Three times a day (TID) | ORAL | Status: DC

## 2014-09-16 MED ORDER — ISOSORBIDE DINITRATE 20 MG PO TABS: 20.0000 mg | ORAL_TABLET | Freq: Three times a day (TID) | ORAL | Status: DC

## 2014-09-16 NOTE — Patient Instructions (Signed)
   Increase Isosorbide to 20mg  three times per day  Begin Hydralazine 25mg  three times per day   New prescriptions sent to pharmacy today. Continue all other medications.   Follow up in  3 months.

## 2014-09-16 NOTE — Progress Notes (Signed)
Patient ID: Shawna [name], female   DOB: 01-18-1941, 74 y.o.   MRN: [id number]      SUBJECTIVE: The patient presents for post hospitalization follow-up. She has a history of coronary artery disease status post CABG, malignant hypertension, hyperlipidemia, chronic diastolic heart failure, and end-stage renal disease on hemodialysis. She ruled out for an acute coronary syndrome. Her blood pressure was markedly elevated. She ultimately underwent a normal nuclear cardiac stress test on 08/25/14, LVEF 57%. Coronary angiography on 12/10/13 demonstrated a patent left main coronary artery, focal significant disease in the mid to distal left anterior descending artery, patent diagonal branch, LIMA to LAD was widely patent, severe restenosis of the proximal left circumflex artery stent, patent SVG to circumflex, 70% proximal ramus stenosis, widely patent SVG to ramus, occluded stent at the ostium of the right coronary artery, and widely patent SVG to distal RCA.  Echocardiogram on 11/27/13 demonstrated normal left ventricular systolic function, EF 0000000, moderate LVH, grade 1 diastolic dysfunction, elevated filling pressures, and mild to moderate mitral regurgitation.  Her blood pressure has been very high lately with systolic readings in the [id number] range. Her labetalol was recently increased by her primary care physician to 200 mg twice daily. Her blood pressure does occasionally drop significantly during dialysis or shortly thereafter. She seldom has chest pains now.   Review of Systems: As per "subjective", otherwise negative.  Allergies  Allergen Reactions  . Aspirin Other (See Comments)    Mess up her stomach; "makes my bowels have blood in them". Takes 81 mg EC Aspirin   . Contrast Media [Iodinated Diagnostic Agents] Itching  . Iron Itching and Other (See Comments)    "they gave me iron in dialysis; had to give me Benadryl cause I had to have the iron" (05/02/2012)  . Macrodantin [Nitrofurantoin  Macrocrystal] Other (See Comments)    "broke me out in big old knots all over my body; had to go to ER"  . Penicillins Other (See Comments)    "makes me real weak when I take it; like I'll pass out"  . Plavix [Clopidogrel Bisulfate] Rash  . Bactrim [Sulfamethoxazole-Trimethoprim] Rash  . Sulfa Antibiotics Rash  . Venofer [Ferric Oxide] Itching    Patient reports using Benadryl prior to doses as [facility]  . Dexilant [Dexlansoprazole] Other (See Comments)    Upset stomach  . Morphine And Related     Itching in feet  . Prilosec [Omeprazole] Other (See Comments)    "back spasms"  . Levaquin [Levofloxacin In D5w] Rash  . Protonix [Pantoprazole Sodium] Rash    Current Outpatient Prescriptions  Medication Sig Dispense Refill  . ALPRAZolam (XANAX) 0.5 MG tablet Take 0.5 mg by mouth 3 (three) times daily.    Marland Kitchen amLODipine (NORVASC) 10 MG tablet Take 1 tablet (10 mg total) by mouth daily. 30 tablet 6  . aspirin EC 81 MG tablet Take 81 mg by mouth daily.    . cloNIDine (CATAPRES) 0.2 MG tablet Take 1 tablet (0.2 mg total) by mouth 2 (two) times daily. 60 tablet 11  . fluticasone (FLONASE) 50 MCG/ACT nasal spray Place 2 sprays into the nose daily as needed for allergies.     . folic acid-vitamin b complex-vitamin c-selenium-zinc (DIALYVITE) 3 MG TABS Take 1 tablet by mouth daily.    Marland Kitchen guaifenesin (ROBITUSSIN) 100 MG/5ML syrup Take 100 mg by mouth daily as needed for cough.    . isosorbide dinitrate (ISORDIL) 10 MG tablet Take 1 tablet (10 mg total) by  mouth 3 (three) times daily. 90 tablet 10  . labetalol (NORMODYNE) 200 MG tablet Take 200 mg by mouth 2 (two) times daily.    Marland Kitchen lanthanum (FOSRENOL) 1000 MG chewable tablet Chew 1,000 mg by mouth 3 (three) times daily after meals.    Marland Kitchen loratadine (CLARITIN) 10 MG tablet Take 10 mg by mouth daily.     . nitroGLYCERIN (NITROSTAT) 0.4 MG SL tablet Place 1 tablet (0.4 mg total) under the tongue every 5 (five) minutes x 3 doses as needed for chest  pain. 25 tablet 3  . omeprazole (PRILOSEC) 20 MG capsule Take 1 capsule (20 mg total) by mouth daily. 30 capsule 11  . ondansetron (ZOFRAN) 4 MG tablet Take 1 tablet (4 mg total) by mouth every 6 (six) hours. (Patient taking differently: Take 4 mg by mouth every 6 (six) hours as needed for nausea or vomiting. ) 12 tablet 0  . SENSIPAR 30 MG tablet Take 30 mg by mouth daily with supper.     . simvastatin (ZOCOR) 20 MG tablet Take 1 tablet by mouth at bedtime.     Marland Kitchen tetrahydrozoline 0.05 % ophthalmic solution Place 1 drop into both eyes 2 (two) times daily as needed (irritation).    Marland Kitchen zolpidem (AMBIEN) 10 MG tablet Take 10 mg by mouth at bedtime as needed for sleep.      No current facility-administered medications for this visit.    Past Medical History  Diagnosis Date  . Chronic bronchitis   . GERD (gastroesophageal reflux disease)   . PUD (peptic ulcer disease)   . History of lower GI bleeding   . Arthritis   . History of gout   . CAD (coronary artery disease)     a. 12/2011 NSTEMI/Cath/PCI LCX (2.25x14 Resolute DES) & D1 (2.25x22 Resolute DES);  b. 01/2012 Cath/PCI: LM 30, LAD 30p, 40-11m, D1 stent ok, 99 in sm branch of diag, LCX patent stent, OM1 20, RCA 95 ost (4.0x12 Promus DES), EF 55%;  c. 04/2012 Lexi Cardiolite  EF 48%, small area of scar @ base/mid inflat wall with mild peri-infarct ischemia.; CABG 12/4  . High cholesterol 12/2011  . Pneumonia ~ 2009  . Iron deficiency anemia   . TIA (transient ischemic attack)   . Anxiety   . History of blood transfusion 07/2011; 12/2011; 01/2012 X 2; 04/2012  . Carotid artery disease     a. [id number] LICA, [id number]   . Mitral regurgitation     a. Moderate by echo, 02/2012  . Myocardial infarction   . Chronic diastolic CHF (congestive heart failure)     a. 02/2012 Echo EF 60-65%, nl wall motion, Gr 1 DD, mod MR  . Hypertension   . AVM (arteriovenous malformation) of colon   . Esophageal stricture   . Ovarian cancer 1992  . Colon cancer 1992  .  ESRD on hemodialysis     ESRD due to HTN, started dialysis 2011 and gets HD at [facility] with Dr Hinda Lenis on MWF schedule.  Access is LUA AVF as of Sept 2014.     Past Surgical History  Procedure Laterality Date  . Abdominal hysterectomy  1992  . Appendectomy  06/1990  . Tubal ligation  1980's  . Av fistula placement  07/2009    left upper arm  . Thrombectomy / arteriovenous graft revision  2011    left upper arm  . Colon resection  1992  . Esophagogastroduodenoscopy  01/20/2012    Procedure: ESOPHAGOGASTRODUODENOSCOPY (EGD);  Surgeon: Norberto Sorenson  Sindy Guadeloupe, MD,FACG;  Location: [facility] ENDOSCOPY;  Service: Endoscopy;  Laterality: N/A;  . Dilation and curettage of uterus    . Coronary angioplasty with stent placement  12/15/11    "2"  . Coronary angioplasty with stent placement  y/2013    "1; makes total of 3" (05/02/2012)  . Coronary artery bypass graft  06/13/2012    Procedure: CORONARY ARTERY BYPASS GRAFTING (CABG);  Surgeon: Grace Isaac, MD;  Location: [facility];  Service: Open Heart Surgery;  Laterality: N/A;  cabg x four;  using left internal mammary artery, and left leg greater saphenous vein harvested endoscopically  . Intraoperative transesophageal echocardiogram  06/13/2012    Procedure: INTRAOPERATIVE TRANSESOPHAGEAL ECHOCARDIOGRAM;  Surgeon: Grace Isaac, MD;  Location: [facility];  Service: Open Heart Surgery;  Laterality: N/A;  . Esophagogastroduodenoscopy N/A 03/26/2013    Procedure: ESOPHAGOGASTRODUODENOSCOPY (EGD);  Surgeon: Irene Shipper, MD;  Location: [facility] ENDOSCOPY;  Service: Endoscopy;  Laterality: N/A;  . Ovary surgery      ovarian cancer  . Cardiac surgery    . Left heart catheterization with coronary angiogram N/A 12/15/2011    Procedure: LEFT HEART CATHETERIZATION WITH CORONARY ANGIOGRAM;  Surgeon: Burnell Blanks, MD;  Location: [facility] CATH LAB;  Service: Cardiovascular;  Laterality: N/A;  . Left heart catheterization with coronary angiogram N/A 01/10/2012    Procedure: LEFT  HEART CATHETERIZATION WITH CORONARY ANGIOGRAM;  Surgeon: Peter M Martinique, MD;  Location: [facility] CATH LAB;  Service: Cardiovascular;  Laterality: N/A;  . Left heart catheterization with coronary angiogram N/A 06/08/2012    Procedure: LEFT HEART CATHETERIZATION WITH CORONARY ANGIOGRAM;  Surgeon: Burnell Blanks, MD;  Location[facility] C[facility] LAB;  Service: Cardiovascular;  Laterality: N/A;  . Shuntogram N/A 10/15/2013    Procedure: Fistulogram;  Surgeon: Serafina Mitchell, MD;  Location: [facility] CATH LAB;  Service: Cardiovascular;  Laterality: N/A;  . Left heart catheterization with coronary/graft angiogram N/A 12/10/2013    Procedure: LEFT HEART CATHETERIZATION WITH Beatrix Fetters;  Surgeon: [name], MD;  Location: [facility] CATH LAB;  Service: Cardiovascular;  Laterality: N/A;    History   Social History  . Marital Status: Married    Spouse Name: N/A  . Number of Children: N/A  . Years of Education: N/A   Occupational History  . Not on file.   Social History Main Topics  . Smoking status: Never Smoker   . Smokeless tobacco: Never Used  . Alcohol Use: No  . Drug Use: No  . Sexual Activity: Yes    Birth Control/ Protection: Surgical   Other Topics Concern  . Not on file   Social History Narrative   Lives in [place], [place] with husband.  Dialysis pt - [facility].     Filed Vitals:   09/16/14 1529  BP: 180/92  Pulse: 76  Height: 5\' 1"  (1.549 m)  Weight: 150 lb (68.04 kg)  SpO2: 97%    PHYSICAL EXAM General: NAD HEENT: Normal. Neck: No JVD, no thyromegaly. Lungs: Clear to auscultation bilaterally with normal respiratory effort. CV: Nondisplaced PMI.  Regular rate and rhythm, normal S1/S2, no S3/S4, no murmur. No pretibial or periankle edema.  No carotid bruit.  Normal pedal pulses.  Abdomen: Soft, nontender, no hepatosplenomegaly, no distention.  Neurologic: Alert and oriented x 3.  Psych: Normal affect. Skin: Normal. Musculoskeletal: Normal range of motion, no gross  deformities. Extremities: No clubbing or cyanosis.   ECG: Most recent ECG reviewed.      ASSESSMENT AND PLAN: 1. CAD  s/p CABG: She is symptomatically stable. Recent normal nuclear stress test (08/25/14). I will continue ASA, labetalol, and statin. Will increase isosorbide dinitrate to 20 mg tid. 2. Malignant HTN: Markedly elevated on  amlodipine, clonidine, and labetalol. I will start hydralazine 25 mg tid, but have instructed her not to take the afternoon dose on dialysis days when her BP occasionally drops. 3. Hyperlipidemia: Will obtain copy of lipids. 4. Mitral regurgitation: Mild to moderate in 11/2013.  5. Carotid artery stenosis: Less than 40% bilaterally on 08/14/14. Continue surveillance monitoring.   Dispo: f/u 3 months.    Kate Sable, M.D., F.A.C.C.

## 2014-09-18 ENCOUNTER — Telehealth: Payer: Self-pay | Admitting: Cardiovascular Disease

## 2014-09-18 NOTE — Telephone Encounter (Signed)
Shawna [name] called the office stating that her feet continue to swell. States that earlier today she started having some chest pains. Took 2 ASA (approximately 15 minutes ago.

## 2014-09-18 NOTE — Telephone Encounter (Signed)
Last seen on 09/16/14 - c/o feet swelling since that date.  No weight gain that she can tell.  Chest pain earlier today 5/10.  Stated she took 2 tabs, 81mg  ASA and felt a little better.  Stated that did relieve pain & no active pain now.  Did make all changes suggested at last visit.  No c/o SOB or dizziness.  Just c/o feeling weak now.  Did not feel like pain was bad enough to take the NTG.  Started Hydralizine yesterday.  Informed patient that message will be sent to provider for advice.  In the mean time, if symptoms worsen she should go to ED for evaluation.  Reviewed proper use of Nitroglycerin.   Patient verbalized understanding.

## 2014-09-19 NOTE — Telephone Encounter (Signed)
Had a normal stress test last month which was encouraging. Given her severe renal disease, volume management (leg swelling) is done via dialysis, as her kidneys would not respond adequately to diuretics. Keep monitoring BP with recent addition of hydralazine. This dose can be increased if BP is still not controlled.

## 2014-09-22 ENCOUNTER — Telehealth: Payer: Self-pay | Admitting: *Deleted

## 2014-09-22 NOTE — Telephone Encounter (Signed)
Letter faxed to [facility]) on 09/16/2014 to request any recent lipids.    Received fax back on 09/19/2014 - no lipid panels on file.

## 2014-09-24 NOTE — Telephone Encounter (Signed)
Left message to return call 

## 2014-09-25 NOTE — Telephone Encounter (Signed)
Patient notified.   Encouraged to drop off BP readings to see if any further titration is needed.  Patient verbalized understanding.

## 2014-09-25 NOTE — Telephone Encounter (Signed)
Addressed in another message.

## 2014-09-25 NOTE — Telephone Encounter (Signed)
Patient returned your call and wanted to discuss her feet swelling

## 2014-09-29 ENCOUNTER — Encounter (HOSPITAL_COMMUNITY): Payer: Self-pay

## 2014-09-29 ENCOUNTER — Emergency Department (HOSPITAL_COMMUNITY): Payer: Medicare Other

## 2014-09-29 ENCOUNTER — Inpatient Hospital Stay (HOSPITAL_COMMUNITY)
Admission: EM | Admit: 2014-09-29 | Discharge: 2014-10-02 | DRG: 202 | Disposition: A | Discharge status: Home / Self Care | Payer: Medicare Other | Attending: Internal Medicine | Admitting: Internal Medicine

## 2014-09-29 ENCOUNTER — Encounter (HOSPITAL_COMMUNITY): Payer: Self-pay | Admitting: Emergency Medicine

## 2014-09-29 ENCOUNTER — Emergency Department (HOSPITAL_COMMUNITY)
Admission: EM | Admit: 2014-09-29 | Discharge: 2014-09-29 | Disposition: A | Discharge status: Home / Self Care | Payer: Medicare Other | Attending: Emergency Medicine | Admitting: Emergency Medicine

## 2014-09-29 DIAGNOSIS — Z8701 Personal history of pneumonia (recurrent): Secondary | ICD-10-CM | POA: Insufficient documentation

## 2014-09-29 DIAGNOSIS — Z9889 Other specified postprocedural states: Secondary | ICD-10-CM | POA: Insufficient documentation

## 2014-09-29 DIAGNOSIS — N2581 Secondary hyperparathyroidism of renal origin: Secondary | ICD-10-CM | POA: Diagnosis present

## 2014-09-29 DIAGNOSIS — J40 Bronchitis, not specified as acute or chronic: Secondary | ICD-10-CM | POA: Diagnosis not present

## 2014-09-29 DIAGNOSIS — Z8673 Personal history of transient ischemic attack (TIA), and cerebral infarction without residual deficits: Secondary | ICD-10-CM | POA: Diagnosis not present

## 2014-09-29 DIAGNOSIS — K219 Gastro-esophageal reflux disease without esophagitis: Secondary | ICD-10-CM | POA: Diagnosis present

## 2014-09-29 DIAGNOSIS — M1990 Unspecified osteoarthritis, unspecified site: Secondary | ICD-10-CM | POA: Insufficient documentation

## 2014-09-29 DIAGNOSIS — I252 Old myocardial infarction: Secondary | ICD-10-CM

## 2014-09-29 DIAGNOSIS — N186 End stage renal disease: Secondary | ICD-10-CM | POA: Insufficient documentation

## 2014-09-29 DIAGNOSIS — Z955 Presence of coronary angioplasty implant and graft: Secondary | ICD-10-CM

## 2014-09-29 DIAGNOSIS — E871 Hypo-osmolality and hyponatremia: Secondary | ICD-10-CM

## 2014-09-29 DIAGNOSIS — Z951 Presence of aortocoronary bypass graft: Secondary | ICD-10-CM

## 2014-09-29 DIAGNOSIS — J069 Acute upper respiratory infection, unspecified: Secondary | ICD-10-CM | POA: Diagnosis not present

## 2014-09-29 DIAGNOSIS — R0600 Dyspnea, unspecified: Secondary | ICD-10-CM | POA: Diagnosis not present

## 2014-09-29 DIAGNOSIS — Z9861 Coronary angioplasty status: Secondary | ICD-10-CM | POA: Diagnosis not present

## 2014-09-29 DIAGNOSIS — I340 Nonrheumatic mitral (valve) insufficiency: Secondary | ICD-10-CM | POA: Diagnosis present

## 2014-09-29 DIAGNOSIS — Z7982 Long term (current) use of aspirin: Secondary | ICD-10-CM | POA: Insufficient documentation

## 2014-09-29 DIAGNOSIS — E780 Pure hypercholesterolemia: Secondary | ICD-10-CM | POA: Diagnosis not present

## 2014-09-29 DIAGNOSIS — T424X5A Adverse effect of benzodiazepines, initial encounter: Secondary | ICD-10-CM | POA: Diagnosis present

## 2014-09-29 DIAGNOSIS — I120 Hypertensive chronic kidney disease with stage 5 chronic kidney disease or end stage renal disease: Secondary | ICD-10-CM | POA: Diagnosis not present

## 2014-09-29 DIAGNOSIS — Z9115 Patient's noncompliance with renal dialysis: Secondary | ICD-10-CM | POA: Diagnosis present

## 2014-09-29 DIAGNOSIS — I5032 Chronic diastolic (congestive) heart failure: Secondary | ICD-10-CM | POA: Diagnosis present

## 2014-09-29 DIAGNOSIS — G251 Drug-induced tremor: Secondary | ICD-10-CM | POA: Diagnosis present

## 2014-09-29 DIAGNOSIS — Z880 Allergy status to penicillin: Secondary | ICD-10-CM | POA: Insufficient documentation

## 2014-09-29 DIAGNOSIS — I2510 Atherosclerotic heart disease of native coronary artery without angina pectoris: Secondary | ICD-10-CM | POA: Diagnosis not present

## 2014-09-29 DIAGNOSIS — M898X9 Other specified disorders of bone, unspecified site: Secondary | ICD-10-CM | POA: Diagnosis present

## 2014-09-29 DIAGNOSIS — F419 Anxiety disorder, unspecified: Secondary | ICD-10-CM | POA: Diagnosis not present

## 2014-09-29 DIAGNOSIS — Z85038 Personal history of other malignant neoplasm of large intestine: Secondary | ICD-10-CM | POA: Insufficient documentation

## 2014-09-29 DIAGNOSIS — Z7951 Long term (current) use of inhaled steroids: Secondary | ICD-10-CM | POA: Insufficient documentation

## 2014-09-29 DIAGNOSIS — Z992 Dependence on renal dialysis: Secondary | ICD-10-CM | POA: Diagnosis not present

## 2014-09-29 DIAGNOSIS — I132 Hypertensive heart and chronic kidney disease with heart failure and with stage 5 chronic kidney disease, or end stage renal disease: Secondary | ICD-10-CM | POA: Diagnosis present

## 2014-09-29 DIAGNOSIS — D631 Anemia in chronic kidney disease: Secondary | ICD-10-CM | POA: Diagnosis present

## 2014-09-29 DIAGNOSIS — E8770 Fluid overload, unspecified: Secondary | ICD-10-CM | POA: Diagnosis not present

## 2014-09-29 DIAGNOSIS — Z8543 Personal history of malignant neoplasm of ovary: Secondary | ICD-10-CM

## 2014-09-29 DIAGNOSIS — Z79899 Other long term (current) drug therapy: Secondary | ICD-10-CM | POA: Diagnosis not present

## 2014-09-29 DIAGNOSIS — Z862 Personal history of diseases of the blood and blood-forming organs and certain disorders involving the immune mechanism: Secondary | ICD-10-CM | POA: Insufficient documentation

## 2014-09-29 DIAGNOSIS — J209 Acute bronchitis, unspecified: Secondary | ICD-10-CM | POA: Diagnosis present

## 2014-09-29 DIAGNOSIS — R05 Cough: Secondary | ICD-10-CM | POA: Diagnosis present

## 2014-09-29 DIAGNOSIS — E875 Hyperkalemia: Secondary | ICD-10-CM | POA: Diagnosis present

## 2014-09-29 DIAGNOSIS — I248 Other forms of acute ischemic heart disease: Secondary | ICD-10-CM | POA: Diagnosis present

## 2014-09-29 DIAGNOSIS — J9600 Acute respiratory failure, unspecified whether with hypoxia or hypercapnia: Secondary | ICD-10-CM | POA: Diagnosis present

## 2014-09-29 DIAGNOSIS — Z90710 Acquired absence of both cervix and uterus: Secondary | ICD-10-CM

## 2014-09-29 LAB — BASIC METABOLIC PANEL
ANION GAP: 15 (ref 5–15)
BUN: 73 mg/dL — ABNORMAL HIGH (ref 6–23)
CHLORIDE: 97 mmol/L (ref 96–112)
CO2: 23 mmol/L (ref 19–32)
CREATININE: 12.46 mg/dL — AB (ref 0.50–1.10)
Calcium: 8.6 mg/dL (ref 8.4–10.5)
GFR calc non Af Amer: 3 mL/min — ABNORMAL LOW (ref >90)
GFR, EST AFRICAN AMERICAN: 3 mL/min — AB (ref >90)
Glucose, Bld: 114 mg/dL — ABNORMAL HIGH (ref 70–99)
POTASSIUM: 5.5 mmol/L — AB (ref 3.5–5.1)
Sodium: 135 mmol/L (ref 135–145)

## 2014-09-29 LAB — CBC
HCT: 30.5 % — ABNORMAL LOW (ref 36.0–46.0)
Hemoglobin: 9.8 g/dL — ABNORMAL LOW (ref 12.0–15.0)
MCH: 32.6 pg (ref 26.0–34.0)
MCHC: 32.1 g/dL (ref 30.0–36.0)
MCV: 101.3 fL — ABNORMAL HIGH (ref 78.0–100.0)
PLATELETS: 256 10*3/uL (ref 150–400)
RBC: 3.01 MIL/uL — AB (ref 3.87–5.11)
RDW: 15.1 % (ref 11.5–15.5)
WBC: 10.4 10*3/uL (ref 4.0–10.5)

## 2014-09-29 LAB — TROPONIN I: Troponin I: 0.04 ng/mL — ABNORMAL HIGH (ref <0.031)

## 2014-09-29 IMAGING — DX DG CHEST 2V
2 series · 2 of 2 positions shown · non-contrast
Comparison: [DATE]

CLINICAL DATA: Productive cough, congestion and upper abdominal
pain for 2 days

EXAM:
CHEST  2 VIEW

[chest pa]
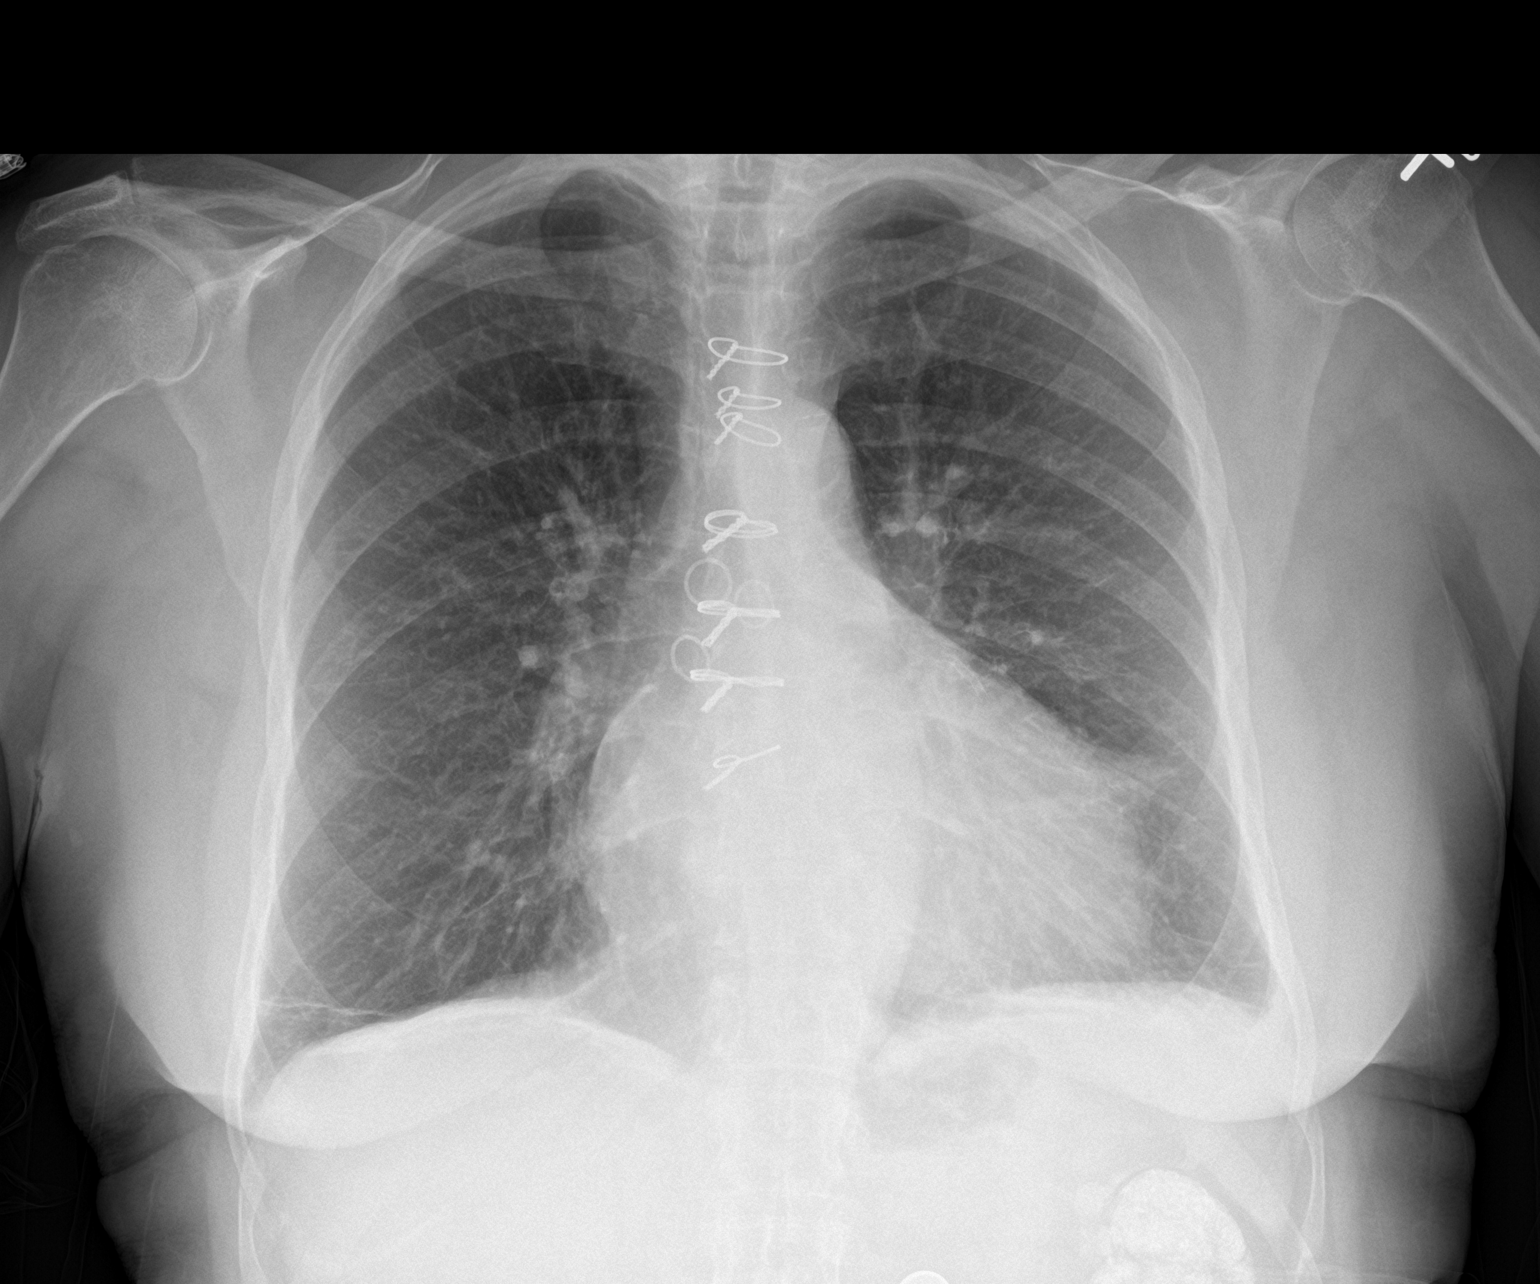

[chest lat]
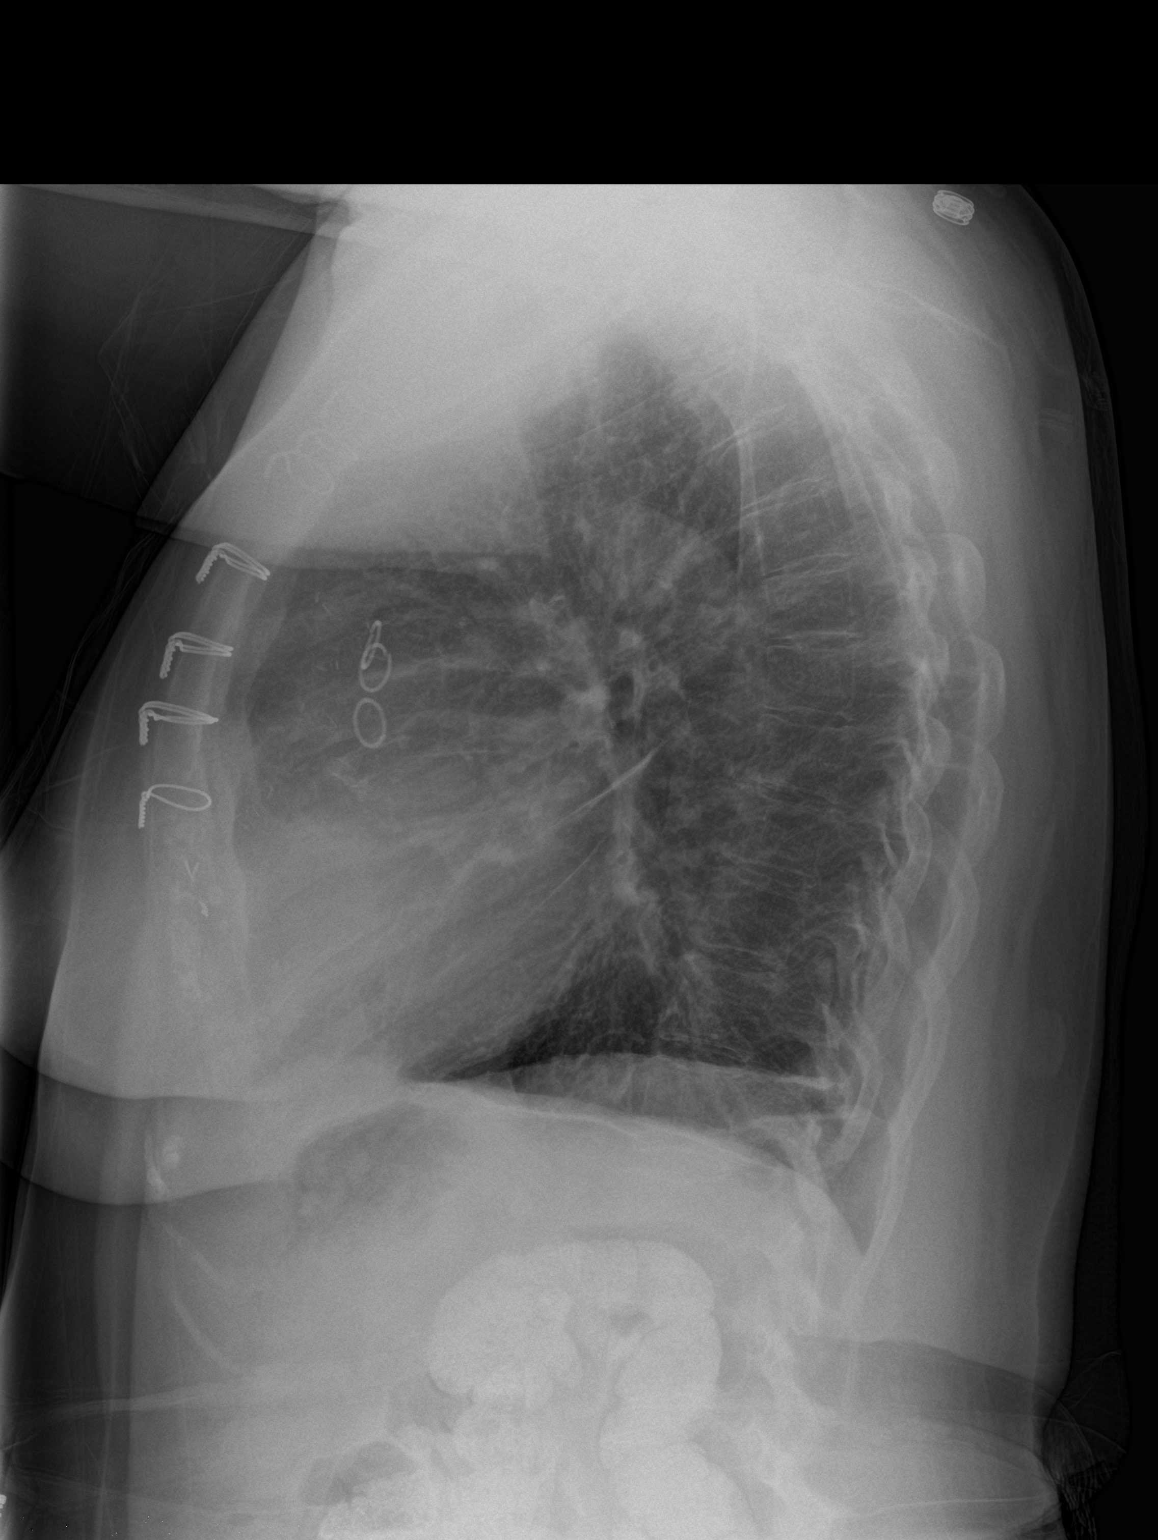

[2 of 2 positions shown; findings below may reference images not displayed]

FINDINGS: There is moderate pulmonary hyperinflation. There is mild
cardiomegaly. There is no acute airspace opacity. There are mild
basilar linear scar is bilaterally. There are no effusions.
IMPRESSION: Hyperinflation and cardiomegaly.  No acute cardiopulmonary findings.

## 2014-09-29 IMAGING — CR DG CHEST 1V PORT
1 series · 1 of 1 positions shown · non-contrast
Comparison: Film earlier today at [OS] hours

CLINICAL DATA: Productive cough.

EXAM:
PORTABLE CHEST - 1 VIEW

[portable]
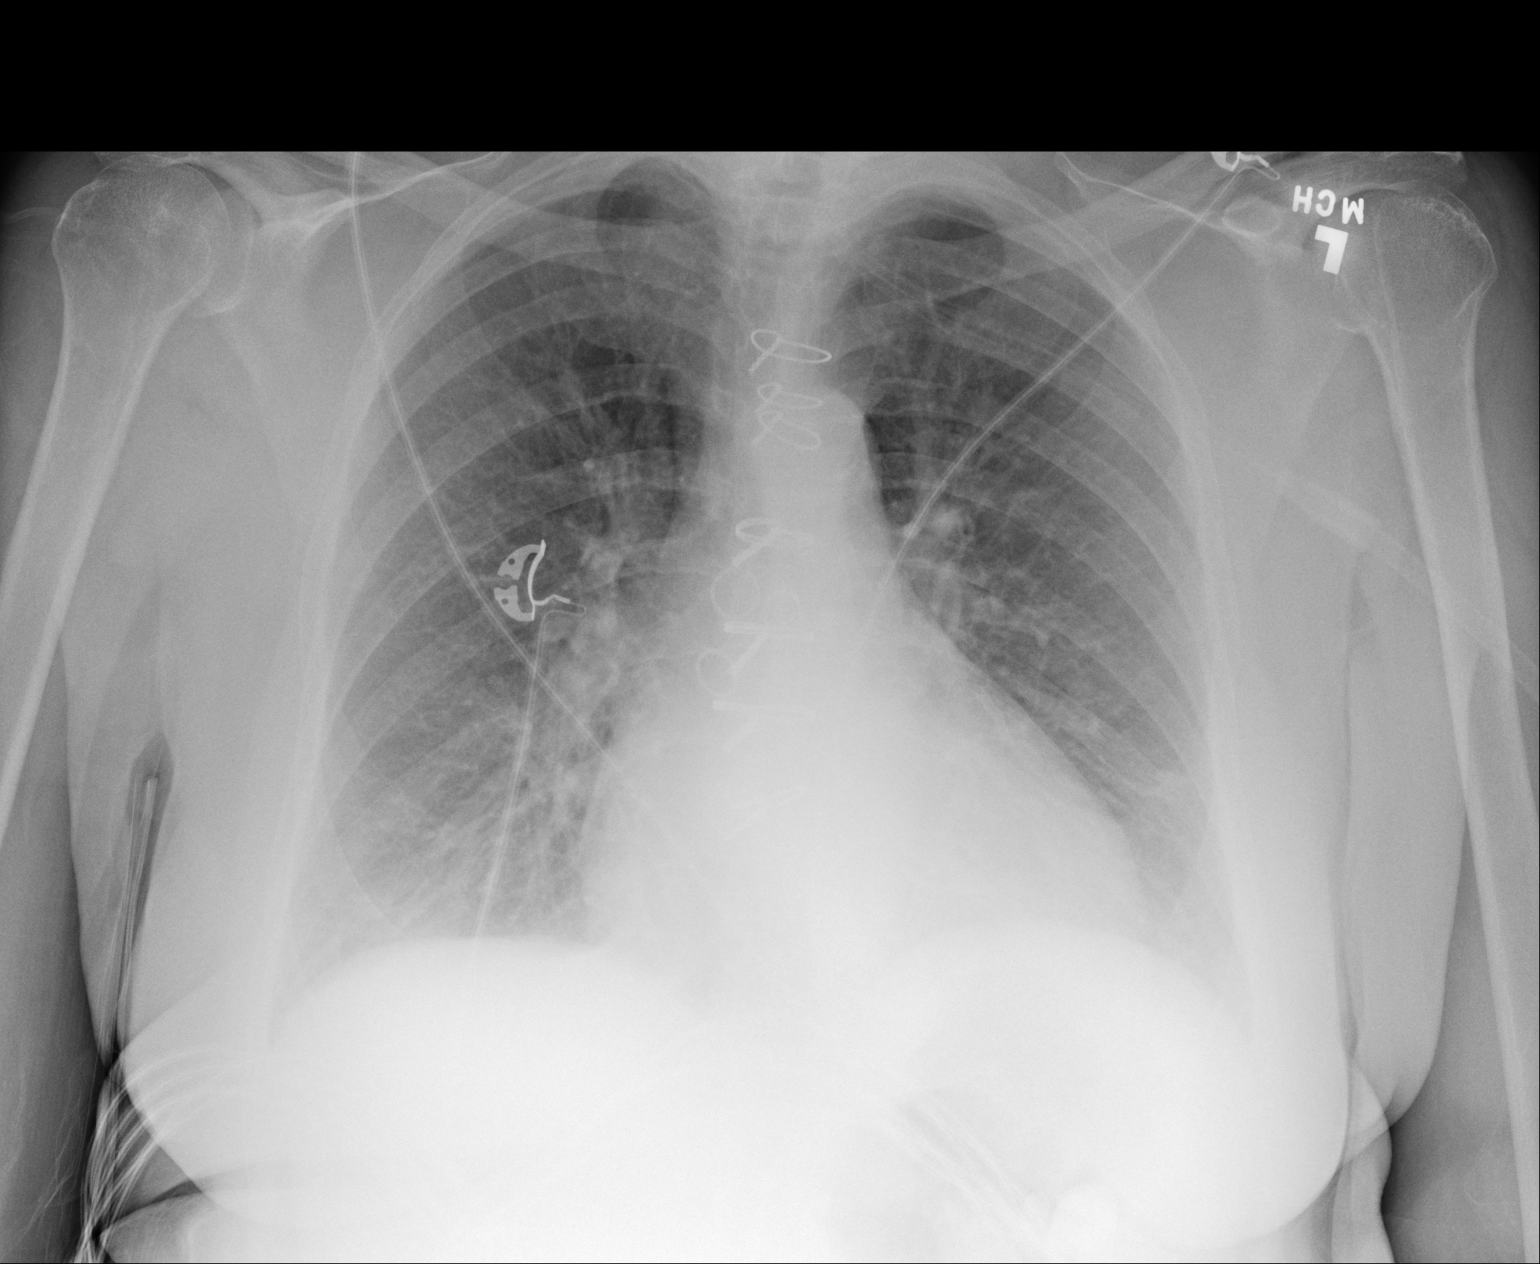

[1 of 1 positions shown; findings below may reference images not displayed]

FINDINGS: The heart size is stable and within normal limits. The patient has
had prior CABG. Mild bibasilar atelectasis present. There is no
evidence of pulmonary edema, consolidation, pneumothorax, nodule or
pleural fluid.
IMPRESSION: Bibasilar atelectasis.  No active disease.

## 2014-09-29 MED ORDER — DM-GUAIFENESIN ER 30-600 MG PO TB12
1.0000 | ORAL_TABLET | Freq: Two times a day (BID) | ORAL | Status: DC
  Administered 2014-09-29: 1 via ORAL
  Filled 2014-09-29: qty 1

## 2014-09-29 MED ORDER — PENTAFLUOROPROP-TETRAFLUOROETH EX AERO: 1.0000 "application " | INHALATION_SPRAY | CUTANEOUS | Status: DC | PRN

## 2014-09-29 MED ORDER — ALTEPLASE 2 MG IJ SOLR
2.0000 mg | Freq: Once | INTRAMUSCULAR | Status: AC | PRN
  Filled 2014-09-29: qty 2

## 2014-09-29 MED ORDER — SODIUM CHLORIDE 0.9 % IV SOLN: 100.0000 mL | INTRAVENOUS | Status: DC | PRN

## 2014-09-29 MED ORDER — IPRATROPIUM-ALBUTEROL 0.5-2.5 (3) MG/3ML IN SOLN
3.0000 mL | Freq: Once | RESPIRATORY_TRACT | Status: AC
  Administered 2014-09-29: 3 mL via RESPIRATORY_TRACT
  Filled 2014-09-29: qty 3

## 2014-09-29 MED ORDER — DM-GUAIFENESIN ER 30-600 MG PO TB12: 1.0000 | ORAL_TABLET | Freq: Every day | ORAL | Status: DC

## 2014-09-29 MED ORDER — LIDOCAINE-PRILOCAINE 2.5-2.5 % EX CREA: 1.0000 "application " | TOPICAL_CREAM | CUTANEOUS | Status: DC | PRN

## 2014-09-29 MED ORDER — HEPARIN SODIUM (PORCINE) 1000 UNIT/ML DIALYSIS
1000.0000 [IU] | INTRAMUSCULAR | Status: DC | PRN
  Filled 2014-09-29: qty 1

## 2014-09-29 MED ORDER — LIDOCAINE HCL (PF) 1 % IJ SOLN: 5.0000 mL | INTRAMUSCULAR | Status: DC | PRN

## 2014-09-29 NOTE — ED Provider Notes (Signed)
CSN: [id number]     Arrival date & time 09/29/14  0155 History   First MD Initiated Contact with Patient 09/29/14 0256     Chief Complaint  Patient presents with  . Cough     (Consider location/radiation/quality/duration/timing/severity/associated sxs/prior Treatment) Patient is a 74 y.o. female presenting with cough. The history is provided by the patient.  Cough Associated symptoms: fever   Associated symptoms: no chest pain, no headaches and no myalgias    Patient is a dialysis patient normally dialyzes Monday Wednesday and Friday. Patient was last dialyzed on Friday. Patient here on Sunday started with the cough and congestion coughing up clear sputum. Feeling warm but no documented fever. No nausea vomiting or diarrhea. Patient did have her flu shot. Patient without significant shortness of breath.  Past Medical History  Diagnosis Date  . Chronic bronchitis   . GERD (gastroesophageal reflux disease)   . PUD (peptic ulcer disease)   . History of lower GI bleeding   . Arthritis   . History of gout   . CAD (coronary artery disease)     a. 12/2011 NSTEMI/Cath/PCI LCX (2.25x14 Resolute DES) & D1 (2.25x22 Resolute DES);  b. 01/2012 Cath/PCI: LM 30, LAD 30p, 40-94m, D1 stent ok, 99 in sm branch of diag, LCX patent stent, OM1 20, RCA 95 ost (4.0x12 Promus DES), EF 55%;  c. 04/2012 Lexi Cardiolite  EF 48%, small area of scar @ base/mid inflat wall with mild peri-infarct ischemia.; CABG 12/4  . High cholesterol 12/2011  . Pneumonia ~ 2009  . Iron deficiency anemia   . TIA (transient ischemic attack)   . Anxiety   . History of blood transfusion 07/2011; 12/2011; 01/2012 X 2; 04/2012  . Carotid artery disease     a. [id number] LICA, [id number]   . Mitral regurgitation     a. Moderate by echo, 02/2012  . Myocardial infarction   . Chronic diastolic CHF (congestive heart failure)     a. 02/2012 Echo EF 60-65%, nl wall motion, Gr 1 DD, mod MR  . Hypertension   . AVM (arteriovenous malformation) of  colon   . Esophageal stricture   . Ovarian cancer 1992  . Colon cancer 1992  . ESRD on hemodialysis     ESRD due to HTN, started dialysis 2011 and gets HD at [facility] with Dr Hinda Lenis on MWF schedule.  Access is LUA AVF as of Sept 2014.    Past Surgical History  Procedure Laterality Date  . Abdominal hysterectomy  1992  . Appendectomy  06/1990  . Tubal ligation  1980's  . Av fistula placement  07/2009    left upper arm  . Thrombectomy / arteriovenous graft revision  2011    left upper arm  . Colon resection  1992  . Esophagogastroduodenoscopy  01/20/2012    Procedure: ESOPHAGOGASTRODUODENOSCOPY (EGD);  Surgeon: Ladene Artist, MD,FACG;  Location: [facility] ENDOSCOPY;  Service: Endoscopy;  Laterality: N/A;  . Dilation and curettage of uterus    . Coronary angioplasty with stent placement  12/15/11    "2"  . Coronary angioplasty with stent placement  y/2013    "1; makes total of 3" (05/02/2012)  . Coronary artery bypass graft  06/13/2012    Procedure: CORONARY ARTERY BYPASS GRAFTING (CABG);  Surgeon: Grace Isaac, MD;  Location: [place];  Service: Open Heart Surgery;  Laterality: N/A;  cabg x four;  using left internal mammary artery, and left leg greater saphenous vein harvested endoscopically  . Intraoperative transesophageal  echocardiogram  06/13/2012    Procedure: INTRAOPERATIVE TRANSESOPHAGEAL ECHOCARDIOGRAM;  Surgeon: Grace Isaac, MD;  Location: [place];  Service: Open Heart Surgery;  Laterality: N/A;  . Esophagogastroduodenoscopy N/A 03/26/2013    Procedure: ESOPHAGOGASTRODUODENOSCOPY (EGD);  Surgeon: Irene Shipper, MD;  Location: [facility] ENDOSCOPY;  Service: Endoscopy;  Laterality: N/A;  . Ovary surgery      ovarian cancer  . Cardiac surgery    . Left heart catheterization with coronary angiogram N/A 12/15/2011    Procedure: LEFT HEART CATHETERIZATION WITH CORONARY ANGIOGRAM;  Surgeon: Burnell Blanks, MD;  Location: [facility] CATH LAB;  Service: Cardiovascular;  Laterality: N/A;  .  Left heart catheterization with coronary angiogram N/A 01/10/2012    Procedure: LEFT HEART CATHETERIZATION WITH CORONARY ANGIOGRAM;  Surgeon: Peter M Martinique, MD;  Location: [facility] CATH LAB;  Service: Cardiovascular;  Laterality: N/A;  . Left heart catheterization with coronary angiogram N/A 06/08/2012    Procedure: LEFT HEART CATHETERIZATION WITH CORONARY ANGIOGRAM;  Surgeon: Burnell Blanks, MD;  Location: [facility] CATH LAB;  Service: Cardiovascular;  Laterality: N/A;  . Shuntogram N/A 10/15/2013    Procedure: Fistulogram;  Surgeon: Serafina Mitchell, MD;  Location: [facility] CATH LAB;  Service: Cardiovascular;  Laterality: N/A;  . Left heart catheterization with coronary/graft angiogram N/A 12/10/2013    Procedure: LEFT HEART CATHETERIZATION WITH Beatrix Fetters;  Surgeon: [name], MD;  Location: [facility] CATH LAB;  Service: Cardiovascular;  Laterality: N/A;   Family History  Problem Relation Age of Onset  . Other      noncontributory for early CAD  . Heart disease Mother     Heart Disease before age 14  . Hyperlipidemia Mother   . Hypertension Mother   . Diabetes Mother   . Heart attack Mother   . Heart disease Father     Heart Disease before age 83  . Hyperlipidemia Father   . Hypertension Father   . Diabetes Father   . Diabetes Sister   . Hypertension Sister   . Diabetes Brother   . Hyperlipidemia Brother   . Heart attack Brother   . Colon cancer Neg Hx   . Esophageal cancer Neg Hx   . Liver disease Neg Hx   . Kidney disease Neg Hx   . Colon polyps Neg Hx   . Hypertension Sister   . Heart attack Brother    History  Substance Use Topics  . Smoking status: Never Smoker   . Smokeless tobacco: Never Used  . Alcohol Use: No   OB History    No data available     Review of Systems  Constitutional: Positive for fever.  HENT: Positive for congestion.   Eyes: Negative for redness.  Respiratory: Positive for cough.   Cardiovascular: Negative for chest pain.   Gastrointestinal: Negative for nausea, vomiting, abdominal pain and diarrhea.  Musculoskeletal: Negative for myalgias.  Neurological: Negative for headaches.  Hematological: Does not bruise/bleed easily.      Allergies  Aspirin; Contrast media; Iron; Macrodantin; Penicillins; Plavix; Bactrim; Sulfa antibiotics; Venofer; Dexilant; Morphine and related; Prilosec; Levaquin; and Protonix  Home Medications   Prior to Admission medications   Medication Sig Start Date End Date Taking? Authorizing Provider  ALPRAZolam Duanne Moron) 0.5 MG tablet Take 0.5 mg by mouth 3 (three) times daily.    Historical Provider, MD  amLODipine (NORVASC) 10 MG tablet Take 1 tablet (10 mg total) by mouth daily. 12/17/13   Lendon Colonel, NP  aspirin EC 81 MG tablet Take 81  mg by mouth daily.    Historical Provider, MD  cloNIDine (CATAPRES) 0.2 MG tablet Take 1 tablet (0.2 mg total) by mouth 2 (two) times daily. 12/23/13   Fay Records, MD  dextromethorphan-guaiFENesin [facility] DM) 30-600 MG per 12 hr tablet Take 1 tablet by mouth daily. 09/29/14   Fredia Sorrow, MD  fluticasone (FLONASE) 50 MCG/ACT nasal spray Place 2 sprays into the nose daily as needed for allergies.     Historical Provider, MD  folic acid-vitamin b complex-vitamin c-selenium-zinc (DIALYVITE) 3 MG TABS Take 1 tablet by mouth daily.    Historical Provider, MD  guaifenesin (ROBITUSSIN) 100 MG/5ML syrup Take 100 mg by mouth daily as needed for cough.    Historical Provider, MD  hydrALAZINE (APRESOLINE) 25 MG tablet Take 1 tablet (25 mg total) by mouth 3 (three) times daily. 09/16/14   Herminio Commons, MD  isosorbide dinitrate (ISORDIL) 20 MG tablet Take 1 tablet (20 mg total) by mouth 3 (three) times daily. 09/16/14   Herminio Commons, MD  labetalol (NORMODYNE) 200 MG tablet Take 200 mg by mouth 2 (two) times daily.    Historical Provider, MD  lanthanum (FOSRENOL) 1000 MG chewable tablet Chew 1,000 mg by mouth 3 (three) times daily after meals.     Historical Provider, MD  loratadine (CLARITIN) 10 MG tablet Take 10 mg by mouth daily.     Historical Provider, MD  nitroGLYCERIN (NITROSTAT) 0.4 MG SL tablet Place 1 tablet (0.4 mg total) under the tongue every 5 (five) minutes x 3 doses as needed for chest pain. 06/23/14   Herminio Commons, MD  omeprazole (PRILOSEC) 20 MG capsule Take 1 capsule (20 mg total) by mouth daily. 02/10/14   Ladene Artist, MD  ondansetron (ZOFRAN) 4 MG tablet Take 1 tablet (4 mg total) by mouth every 6 (six) hours. Patient taking differently: Take 4 mg by mouth every 6 (six) hours as needed for nausea or vomiting.  04/24/14   Ezequiel Essex, MD  SENSIPAR 30 MG tablet Take 30 mg by mouth daily with supper.  12/21/12   Historical Provider, MD  simvastatin (ZOCOR) 20 MG tablet Take 1 tablet by mouth at bedtime.  03/13/14   Historical Provider, MD  tetrahydrozoline 0.05 % ophthalmic solution Place 1 drop into both eyes 2 (two) times daily as needed (irritation).    Historical Provider, MD  zolpidem (AMBIEN) 10 MG tablet Take 10 mg by mouth at bedtime as needed for sleep.  05/27/13   Historical Provider, MD   BP 209/83 mmHg  Pulse 91  Temp(Src) 99 F (37.2 C) (Oral)  Resp 16  Ht 5\' 1"  (1.549 m)  Wt 147 lb (66.679 kg)  BMI 27.79 kg/m2  SpO2 100% Physical Exam  Constitutional: She is oriented to person, place, and time. She appears well-developed and well-nourished. No distress.  HENT:  Head: Normocephalic and atraumatic.  Mouth/Throat: Oropharynx is clear and moist.  Eyes: Conjunctivae and EOM are normal. Pupils are equal, round, and reactive to light.  Neck: Normal range of motion.  Cardiovascular: Normal rate, regular rhythm and normal heart sounds.   No murmur heard. Pulmonary/Chest: Effort normal and breath sounds normal. No respiratory distress. She has no wheezes.  Abdominal: Soft. Bowel sounds are normal. There is no tenderness.  Musculoskeletal: Normal range of motion.  Dialysis graft fistula left arm  good thrill good pulse.  Neurological: She is alert and oriented to person, place, and time. No cranial nerve deficit. She exhibits normal muscle tone.  Coordination normal.  Skin: Skin is warm. No rash noted.  Nursing note and vitals reviewed.   ED Course  Procedures (including critical care time) Labs Review Labs Reviewed - No data to display  Imaging Review Dg Chest 2 View  09/29/2014   CLINICAL DATA:  Productive cough, congestion and upper abdominal pain for 2 days  EXAM: CHEST  2 VIEW  COMPARISON:  02/14/2014  FINDINGS: There is moderate pulmonary hyperinflation. There is mild cardiomegaly. There is no acute airspace opacity. There are mild basilar linear scar is bilaterally. There are no effusions.  IMPRESSION: Hyperinflation and cardiomegaly.  No acute cardiopulmonary findings.   Electronically Signed   By: Andreas Newport M.D.   On: 09/29/2014 02:46     EKG Interpretation None      MDM   Final diagnoses:  Bronchitis  URI (upper respiratory infection)    Patient with onset of upper respiratory type illness symptoms bronchitis productive cough on Sunday. Patient is bringing up clear sputum. Chest x-rays negative for pneumonia or pulmonary edema. Patient is a dialysis patient normally dialyzed Monday Wednesdays and Fridays. Patient the with feeling of fever but no fever here. Oxygen saturation are upper 90s.  Patient currently nontoxic no acute distress. We treated with Mucinex DM and will continue her dialysis schedule. Mucinex modified for her renal failure.  Patient given precautions to return for any new or worse symptoms at all. Patient is at significant risk for serious illness due to her immunocompromise state. However currently is stable.    Fredia Sorrow, MD 09/29/14 [phone]

## 2014-09-29 NOTE — ED Notes (Signed)
Lab called about troponin.

## 2014-09-29 NOTE — ED Notes (Signed)
Pt alert & oriented x4, stable gait. Patient given discharge instructions, paperwork & prescription(s). Patient  instructed to stop at the registration desk to finish any additional paperwork. Patient verbalized understanding. Pt left department w/ no further questions. 

## 2014-09-29 NOTE — ED Notes (Addendum)
Dialysis nurse notified of hemodialysis order. 2 patients currently being dialyzed, approx. 2 hours before Shawna [name] can be dialyzed.

## 2014-09-29 NOTE — ED Notes (Signed)
RT Called

## 2014-09-29 NOTE — Discharge Instructions (Signed)
Chest x-ray negative. However this does seem to be the onset of an upper respiratory infection. Could be flulike illness. If you get worse return. Follow-up with dialysis as scheduled for your normal routine on Monday Wednesday and Friday. Take the Mucinex DM as directed. Again return for any new or worse symptoms.

## 2014-09-29 NOTE — ED Notes (Signed)
Respiratory called

## 2014-09-29 NOTE — ED Notes (Signed)
Congestion in my chest, stomach has been hurting, and coughing a lot per pt.

## 2014-09-29 NOTE — ED Notes (Signed)
MD at bedside. 

## 2014-09-29 NOTE — ED Notes (Signed)
Respiratory at bedside. Report called, Pt to go upstairs after breathing treatment.

## 2014-09-29 NOTE — ED Provider Notes (Addendum)
CSN: [id number]     Arrival date & time 09/29/14  1213 History   First MD Initiated Contact with Patient 09/29/14 1555    This chart was scribed for Nat Christen, MD by Terressa Koyanagi, ED Scribe. This patient was seen in room APA01/APA01 and the patient's care was started at 4:17 PM. Chief Complaint  Patient presents with  . Cough   The history is provided by the patient. No language interpreter was used.   PCP: [name], MD HPI Comments: Level V caveat for urgent need for intervention Shawna [name] is a 74 y.o. female, with PMH noted below pt is also a MWF dialysis pt (pt notes that she did not go to dialysis today), who presents to the Emergency Department complaining of upper respiratory type illness including productive cough, SOB and itching to her lower extremities. Pt presented to, and was treated for the same at the ED early this morning. She missed her dialysis today. No fever, sweats, chills.  Past Medical History  Diagnosis Date  . Chronic bronchitis   . GERD (gastroesophageal reflux disease)   . PUD (peptic ulcer disease)   . History of lower GI bleeding   . Arthritis   . History of gout   . CAD (coronary artery disease)     a. 12/2011 NSTEMI/Cath/PCI LCX (2.25x14 Resolute DES) & D1 (2.25x22 Resolute DES);  b. 01/2012 Cath/PCI: LM 30, LAD 30p, 40-15m, D1 stent ok, 99 in sm branch of diag, LCX patent stent, OM1 20, RCA 95 ost (4.0x12 Promus DES), EF 55%;  c. 04/2012 Lexi Cardiolite  EF 48%, small area of scar @ base/mid inflat wall with mild peri-infarct ischemia.; CABG 12/4  . High cholesterol 12/2011  . Pneumonia ~ 2009  . Iron deficiency anemia   . TIA (transient ischemic attack)   . Anxiety   . History of blood transfusion 07/2011; 12/2011; 01/2012 X 2; 04/2012  . Carotid artery disease     a. [id number] LICA, [id number]   . Mitral regurgitation     a. Moderate by echo, 02/2012  . Myocardial infarction   . Chronic diastolic CHF (congestive heart failure)     a. 02/2012 Echo EF  60-65%, nl wall motion, Gr 1 DD, mod MR  . Hypertension   . AVM (arteriovenous malformation) of colon   . Esophageal stricture   . Ovarian cancer 1992  . Colon cancer 1992  . ESRD on hemodialysis     ESRD due to HTN, started dialysis 2011 and gets HD at [facility] with Dr Hinda Lenis on MWF schedule.  Access is LUA AVF as of Sept 2014.    Past Surgical History  Procedure Laterality Date  . Abdominal hysterectomy  1992  . Appendectomy  06/1990  . Tubal ligation  1980's  . Av fistula placement  07/2009    left upper arm  . Thrombectomy / arteriovenous graft revision  2011    left upper arm  . Colon resection  1992  . Esophagogastroduodenoscopy  01/20/2012    Procedure: ESOPHAGOGASTRODUODENOSCOPY (EGD);  Surgeon: Ladene Artist, MD,FACG;  Location: [facility] ENDOSCOPY;  Service: Endoscopy;  Laterality: N/A;  . Dilation and curettage of uterus    . Coronary angioplasty with stent placement  12/15/11    "2"  . Coronary angioplasty with stent placement  y/2013    "1; makes total of 3" (05/02/2012)  . Coronary artery bypass graft  06/13/2012    Procedure: CORONARY ARTERY BYPASS GRAFTING (CABG);  Surgeon: Grace Isaac,  MD;  Location: [facility] OR;  Service: Open Heart Surgery;  Laterality: N/A;  cabg x four;  using left internal mammary artery, and left leg greater saphenous vein harvested endoscopically  . Intraoperative transesophageal echocardiogram  06/13/2012    Procedure: INTRAOPERATIVE TRANSESOPHAGEAL ECHOCARDIOGRAM;  Surgeon: Grace Isaac, MD;  Location: [place];  Service: Open Heart Surgery;  Laterality: N/A;  . Esophagogastroduodenoscopy N/A 03/26/2013    Procedure: ESOPHAGOGASTRODUODENOSCOPY (EGD);  Surgeon: Irene Shipper, MD;  Location: [facility] ENDOSCOPY;  Service: Endoscopy;  Laterality: N/A;  . Ovary surgery      ovarian cancer  . Cardiac surgery    . Left heart catheterization with coronary angiogram N/A 12/15/2011    Procedure: LEFT HEART CATHETERIZATION WITH CORONARY ANGIOGRAM;  Surgeon:  Burnell Blanks, MD;  Location: [facility] CATH LAB;  Service: Cardiovascular;  Laterality: N/A;  . Left heart catheterization with coronary angiogram N/A 01/10/2012    Procedure: LEFT HEART CATHETERIZATION WITH CORONARY ANGIOGRAM;  Surgeon: Peter M Martinique, MD;  Location: [facility] CATH LAB;  Service: Cardiovascular;  Laterality: N/A;  . Left heart catheterization with coronary angiogram N/A 06/08/2012    Procedure: LEFT HEART CATHETERIZATION WITH CORONARY ANGIOGRAM;  Surgeon: Burnell Blanks, MD;  Location: [facility] CATH LAB;  Service: Cardiovascular;  Laterality: N/A;  . Shuntogram N/A 10/15/2013    Procedure: Fistulogram;  Surgeon: Serafina Mitchell, MD;  Location: [facility] CATH LAB;  Service: Cardiovascular;  Laterality: N/A;  . Left heart catheterization with coronary/graft angiogram N/A 12/10/2013    Procedure: LEFT HEART CATHETERIZATION WITH Beatrix Fetters;  Surgeon: [name], MD;  Location: [facility] CATH LAB;  Service: Cardiovascular;  Laterality: N/A;   Family History  Problem Relation Age of Onset  . Other      noncontributory for early CAD  . Heart disease Mother     Heart Disease before age 56  . Hyperlipidemia Mother   . Hypertension Mother   . Diabetes Mother   . Heart attack Mother   . Heart disease Father     Heart Disease before age 40  . Hyperlipidemia Father   . Hypertension Father   . Diabetes Father   . Diabetes Sister   . Hypertension Sister   . Diabetes Brother   . Hyperlipidemia Brother   . Heart attack Brother   . Colon cancer Neg Hx   . Esophageal cancer Neg Hx   . Liver disease Neg Hx   . Kidney disease Neg Hx   . Colon polyps Neg Hx   . Hypertension Sister   . Heart attack Brother    History  Substance Use Topics  . Smoking status: Never Smoker   . Smokeless tobacco: Never Used  . Alcohol Use: No   OB History    No data available     Review of Systems  Unable to perform ROS: Acuity of condition  Skin:       Itching to lower extremities    Psychiatric/Behavioral: Negative for confusion.   Allergies  Aspirin; Contrast media; Iron; Macrodantin; Penicillins; Plavix; Bactrim; Sulfa antibiotics; Venofer; Dexilant; Morphine and related; Prilosec; Levaquin; and Protonix  Home Medications   Prior to Admission medications   Medication Sig Start Date End Date Taking? Authorizing Provider  cloNIDine (CATAPRES) 0.2 MG tablet Take 1 tablet (0.2 mg total) by mouth 2 (two) times daily. 12/23/13  Yes Fay Records, MD  guaiFENesin (MUCINEX) 600 MG 12 hr tablet Take by mouth 2 (two) times daily as needed for cough or to loosen  phlegm.   Yes Historical Provider, MD  labetalol (NORMODYNE) 200 MG tablet Take 200 mg by mouth 2 (two) times daily.   Yes Historical Provider, MD  ALPRAZolam Duanne Moron) 0.5 MG tablet Take 0.5 mg by mouth 3 (three) times daily.    Historical Provider, MD  amLODipine (NORVASC) 10 MG tablet Take 1 tablet (10 mg total) by mouth daily. 12/17/13   Lendon Colonel, NP  aspirin EC 81 MG tablet Take 81 mg by mouth daily.    Historical Provider, MD  dextromethorphan-guaiFENesin (MUCINEX DM) 30-600 MG per 12 hr tablet Take 1 tablet by mouth daily. 09/29/14   Fredia Sorrow, MD  fluticasone (FLONASE) 50 MCG/ACT nasal spray Place 2 sprays into the nose daily as needed for allergies.     Historical Provider, MD  folic acid-vitamin b complex-vitamin c-selenium-zinc (DIALYVITE) 3 MG TABS Take 1 tablet by mouth daily.    Historical Provider, MD  guaifenesin (ROBITUSSIN) 100 MG/5ML syrup Take 100 mg by mouth daily as needed for cough.    Historical Provider, MD  hydrALAZINE (APRESOLINE) 25 MG tablet Take 1 tablet (25 mg total) by mouth 3 (three) times daily. 09/16/14   Herminio Commons, MD  isosorbide dinitrate (ISORDIL) 20 MG tablet Take 1 tablet (20 mg total) by mouth 3 (three) times daily. 09/16/14   Herminio Commons, MD  lanthanum (FOSRENOL) 1000 MG chewable tablet Chew 1,000 mg by mouth 3 (three) times daily after meals.    Historical  Provider, MD  loratadine (CLARITIN) 10 MG tablet Take 10 mg by mouth daily.     Historical Provider, MD  nitroGLYCERIN (NITROSTAT) 0.4 MG SL tablet Place 1 tablet (0.4 mg total) under the tongue every 5 (five) minutes x 3 doses as needed for chest pain. 06/23/14   Herminio Commons, MD  omeprazole (PRILOSEC) 20 MG capsule Take 1 capsule (20 mg total) by mouth daily. 02/10/14   Ladene Artist, MD  ondansetron (ZOFRAN) 4 MG tablet Take 1 tablet (4 mg total) by mouth every 6 (six) hours. Patient taking differently: Take 4 mg by mouth every 6 (six) hours as needed for nausea or vomiting.  04/24/14   Ezequiel Essex, MD  SENSIPAR 30 MG tablet Take 30 mg by mouth daily with supper.  12/21/12   Historical Provider, MD  simvastatin (ZOCOR) 20 MG tablet Take 1 tablet by mouth at bedtime.  03/13/14   Historical Provider, MD  tetrahydrozoline 0.05 % ophthalmic solution Place 1 drop into both eyes 2 (two) times daily as needed (irritation).    Historical Provider, MD  zolpidem (AMBIEN) 10 MG tablet Take 10 mg by mouth at bedtime as needed for sleep.  05/27/13   Historical Provider, MD   Triage Vitals: BP 220/141 mmHg  Pulse 94  Temp(Src) 98.5 F (36.9 C) (Oral)  Resp 20  Ht 5\' 1"  (1.549 m)  Wt 147 lb (66.679 kg)  BMI 27.79 kg/m2  SpO2 94% Physical Exam  Constitutional: She is oriented to person, place, and time.  Slight dyspnea and tachypnea  HENT:  Head: Normocephalic and atraumatic.  Eyes: Conjunctivae and EOM are normal. Pupils are equal, round, and reactive to light.  Neck: Normal range of motion. Neck supple.  Cardiovascular: Normal rate and regular rhythm.   Pulmonary/Chest: Effort normal.  bilat exp wheezing  Abdominal: Soft. Bowel sounds are normal.  Musculoskeletal: Normal range of motion.  Neurological: She is alert and oriented to person, place, and time.  Skin: Skin is warm and dry.  2+  peripheral edema  Psychiatric: She has a normal mood and affect. Her behavior is normal.  Nursing  note and vitals reviewed.   ED Course  Procedures (including critical care time) DIAGNOSTIC STUDIES: Oxygen Saturation is 94% on RA, nl by my interpretation.    COORDINATION OF CARE: 4:20 PM-Discussed treatment plan with pt at bedside and pt agreed to plan.   Labs Review Labs Reviewed  CBC - Abnormal; Notable for the following:    RBC 3.01 (*)    Hemoglobin 9.8 (*)    HCT 30.5 (*)    MCV 101.3 (*)    All other components within normal limits  BASIC METABOLIC PANEL - Abnormal; Notable for the following:    Potassium 5.5 (*)    Glucose, Bld 114 (*)    BUN 73 (*)    Creatinine, Ser 12.46 (*)    GFR calc non Af Amer 3 (*)    GFR calc Af Amer 3 (*)    All other components within normal limits  TROPONIN I - Abnormal; Notable for the following:    Troponin I 0.04 (*)    All other components within normal limits    Imaging Review Dg Chest 2 View  09/29/2014   CLINICAL DATA:  Productive cough, congestion and upper abdominal pain for 2 days  EXAM: CHEST  2 VIEW  COMPARISON:  02/14/2014  FINDINGS: There is moderate pulmonary hyperinflation. There is mild cardiomegaly. There is no acute airspace opacity. There are mild basilar linear scar is bilaterally. There are no effusions.  IMPRESSION: Hyperinflation and cardiomegaly.  No acute cardiopulmonary findings.   Electronically Signed   By: Andreas Newport M.D.   On: 09/29/2014 02:46   Dg Chest Port 1 View  09/29/2014   CLINICAL DATA:  Productive cough.  EXAM: PORTABLE CHEST - 1 VIEW  COMPARISON:  Film earlier today at 0226 hours  FINDINGS: The heart size is stable and within normal limits. The patient has had prior CABG. Mild bibasilar atelectasis present. There is no evidence of pulmonary edema, consolidation, pneumothorax, nodule or pleural fluid.  IMPRESSION: Bibasilar atelectasis.  No active disease.   Electronically Signed   By: Aletta Edouard M.D.   On: 09/29/2014 16:47     EKG Interpretation   Date/Time:  Monday September 29 2014  16:35:39 EDT Ventricular Rate:  90 PR Interval:  167 QRS Duration: 102 QT Interval:  404 QTC Calculation: 494 R Axis:   3 Text Interpretation:  Sinus rhythm LVH with secondary repolarization  abnormality Anterior Q waves, possibly due to LVH Baseline wander in  lead(s) V6 Confirmed by Kay Shippy  MD, Ever Gustafson ([id number]) on 09/29/2014 5:10:45 PM      MDM   Final diagnoses:  Dyspnea  ESRD (end stage renal disease)  URI (upper respiratory infection)    Chest x-ray shows no pulmonary edema. Discussed with nephrologist. He will dialyze patient. Albuterol/Atrovent nebulizer treatment given  I personally performed the services described in this documentation, which was scribed in my presence. The recorded information has been reviewed and is accurate.    Nat Christen, MD 09/29/14 [place], MD 09/29/14 [phone]

## 2014-09-29 NOTE — ED Notes (Signed)
Pt states she started with a productive cough that started yesterday. Says she getting up clear sputum. Also says upper stomach hurting.

## 2014-09-29 NOTE — ED Notes (Addendum)
Pt seen this am for same. Pt reports was diagnosed with bronchitis. Pt reports continued fatigue,generalized weakness, and chest tightness only with coughing. nad noted. Pt denies any pain at this time. No dyspnea noted.

## 2014-09-29 NOTE — Consult Note (Signed)
Reason for Consult: End-stage renal disease Referring Physician: Clare Gandy hospitalist group  Shawna [name] is an 74 y.o. female.  HPI: She is a patient who has history of hypertension, anxiety disorder, TIA, coronary artery disease and end-stage renal disease on maintenance hemodialysis presently came with complaints of cough, sputum production and wheezing. Patient was initially managed and seemed to home to come back with similar problem in complaining of difficulty breathing. Patient presently denies any nausea vomiting. She has missed her dialysis.I have reviewed the patient's current medications.  Past Medical History  Diagnosis Date  . Chronic bronchitis   . GERD (gastroesophageal reflux disease)   . PUD (peptic ulcer disease)   . History of lower GI bleeding   . Arthritis   . History of gout   . CAD (coronary artery disease)     a. 12/2011 NSTEMI/Cath/PCI LCX (2.25x14 Resolute DES) & D1 (2.25x22 Resolute DES);  b. 01/2012 Cath/PCI: LM 30, LAD 30p, 40-75m D1 stent ok, 99 in sm branch of diag, LCX patent stent, OM1 20, RCA 95 ost (4.0x12 Promus DES), EF 55%;  c. 04/2012 Lexi Cardiolite  EF 48%, small area of scar @ base/mid inflat wall with mild peri-infarct ischemia.; CABG 12/4  . High cholesterol 12/2011  . Pneumonia ~ 2009  . Iron deficiency anemia   . TIA (transient ischemic attack)   . Anxiety   . History of blood transfusion 07/2011; 12/2011; 01/2012 X 2; 04/2012  . Carotid artery disease     a. 673-73%LICA, 96/6815  . Mitral regurgitation     a. Moderate by echo, 02/2012  . Myocardial infarction   . Chronic diastolic CHF (congestive heart failure)     a. 02/2012 Echo EF 60-65%, nl wall motion, Gr 1 DD, mod MR  . Hypertension   . AVM (arteriovenous malformation) of colon   . Esophageal stricture   . Ovarian cancer 1992  . Colon cancer 1992  . ESRD on hemodialysis     ESRD due to HTN, started dialysis 2011 [facility].  Access is LUA AVF  as of Sept 2014.     Past Surgical History  Procedure Laterality Date  . Abdominal hysterectomy  1992  . Appendectomy  06/1990  . Tubal ligation  1980's  . Av fistula placement  07/2009    left upper arm  . Thrombectomy / arteriovenous graft revision  2011    left upper arm  . Colon resection  1992  . Esophagogastroduodenoscopy  01/20/2012    Procedure: ESOPHAGOGASTRODUODENOSCOPY (EGD);  Surgeon: MLadene Artist MD,FACG;  [facility];  Service: Endoscopy;  Laterality: N/A;  . Dilation and curettage of uterus    . Coronary angioplasty with stent placement  12/15/11    "2"  . Coronary angioplasty with stent placement  y/2013    "1; makes total of 3" (05/02/2012)  . Coronary artery bypass graft  06/13/2012    Procedure: CORONARY ARTERY BYPASS GRAFTING (CABG);  Surgeon: EGrace Isaac MD;  [place]  Service: Open Heart Surgery;  Laterality: N/A;  cabg x four;  using left internal mammary artery, and left leg greater saphenous vein harvested endoscopically  . Intraoperative transesophageal echocardiogram  06/13/2012    Procedure: INTRAOPERATIVE TRANSESOPHAGEAL ECHOCARDIOGRAM;  Surgeon: EGrace Isaac MD;  [facility]  Service: Open Heart Surgery;  Laterality: N/A;  . Esophagogastroduodenoscopy N/A 03/26/2013    Procedure: ESOPHAGOGASTRODUODENOSCOPY (EGD);  Surgeon: JIrene Shipper  MD;  Location: [place] ENDOSCOPY;  Service: Endoscopy;  Laterality: N/A;  . Ovary surgery      ovarian cancer  . Cardiac surgery    . Left heart catheterization with coronary angiogram N/A 12/15/2011    Procedure: LEFT HEART CATHETERIZATION WITH CORONARY ANGIOGRAM;  Surgeon: Burnell Blanks, MD;  Location: [facility] CATH LAB;  Service: Cardiovascular;  Laterality: N/A;  . Left heart catheterization with coronary angiogram N/A 01/10/2012    Procedure: LEFT HEART CATHETERIZATION WITH CORONARY ANGIOGRAM;  Surgeon: Peter M Martinique, MD;  Location[facility] CATH LAB;  Service: Cardiovascular;  Laterality: N/A;   . Left heart catheterization with coronary angiogram N/A 06/08/2012    Procedure: LEFT HEART CATHETERIZATION WITH CORONARY ANGIOGRAM;  Surgeon: Burnell Blanks, MD;  Location: [facility] CATH LAB;  Service: Cardiovascular;  Laterality: N/A;  . Shuntogram N/A 10/15/2013    Procedure: Fistulogram;  Surgeon: Serafina Mitchell, MD;  Location: [facility] CATH LAB;  Service: Cardiovascular;  Laterality: N/A;  . Left heart catheterization with coronary/graft angiogram N/A 12/10/2013    Procedure: LEFT HEART CATHETERIZATION WITH Beatrix Fetters;  Surgeon: [name], MD;  Location: [facility] CATH LAB;  Service: Cardiovascular;  Laterality: N/A;    Family History  Problem Relation Age of Onset  . Other      noncontributory for early CAD  . Heart disease Mother     Heart Disease before age 56  . Hyperlipidemia Mother   . Hypertension Mother   . Diabetes Mother   . Heart attack Mother   . Heart disease Father     Heart Disease before age 57  . Hyperlipidemia Father   . Hypertension Father   . Diabetes Father   . Diabetes Sister   . Hypertension Sister   . Diabetes Brother   . Hyperlipidemia Brother   . Heart attack Brother   . Colon cancer Neg Hx   . Esophageal cancer Neg Hx   . Liver disease Neg Hx   . Kidney disease Neg Hx   . Colon polyps Neg Hx   . Hypertension Sister   . Heart attack Brother     Social History:  reports that she has never smoked. She has never used smokeless tobacco. She reports that she does not drink alcohol or use illicit drugs.  Allergies:  Allergies  Allergen Reactions  . Aspirin Other (See Comments)    Mess up her stomach; "makes my bowels have blood in them". Takes 81 mg EC Aspirin   . Contrast Media [Iodinated Diagnostic Agents] Itching  . Iron Itching and Other (See Comments)    "they gave me iron in dialysis; had to give me Benadryl cause I had to have the iron" (05/02/2012)  . Macrodantin [Nitrofurantoin Macrocrystal] Other (See Comments)    "broke  me out in big old knots all over my body; had to go to ER"  . Penicillins Other (See Comments)    "makes me real weak when I take it; like I'll pass out"  . Plavix [Clopidogrel Bisulfate] Rash  . Bactrim [Sulfamethoxazole-Trimethoprim] Rash  . Sulfa Antibiotics Rash  . Venofer [Ferric Oxide] Itching    Patient reports using Benadryl prior to doses as [facility]  . Dexilant [Dexlansoprazole] Other (See Comments)    Upset stomach  . Morphine And Related     Itching in feet  . Prilosec [Omeprazole] Other (See Comments)    "back spasms"  . Levaquin [Levofloxacin In D5w] Rash  . Protonix [Pantoprazole Sodium] Rash  Medications: I have reviewed the patient's current medications.  Results for orders placed or performed during the hospital encounter of 09/29/14 (from the past 48 hour(s))  [facility]  [facility])     Status: Abnormal   Collection Time: 09/29/14 12:32 PM  Result Value Ref Range   WBC 10.4 4.0 - 10.5 K/uL   RBC 3.01 (L) 3.87 - 5.11 MIL/uL   Hemoglobin 9.8 (L) 12.0 - 15.0 g/dL   HCT 30.5 (L) 36.0 - 46.0 %   MCV 101.3 (H) 78.0 - 100.0 fL   MCH 32.6 26.0 - 34.0 pg   MCHC 32.1 30.0 - 36.0 g/dL   RDW 15.1 11.5 - 15.5 %   Platelets 256 150 - 400 K/uL  Basic metabolic panel  (at [facility])     Status: Abnormal   Collection Time: 09/29/14 12:32 PM  Result Value Ref Range   Sodium 135 135 - 145 mmol/L   Potassium 5.5 (H) 3.5 - 5.1 mmol/L   Chloride 97 96 - 112 mmol/L   CO2 23 19 - 32 mmol/L   Glucose, Bld 114 (H) 70 - 99 mg/dL   BUN 73 (H) 6 - 23 mg/dL   Creatinine, Ser 12.46 (H) 0.50 - 1.10 mg/dL   Calcium 8.6 8.4 - 10.5 mg/dL   GFR calc non Af Amer 3 (L) >90 mL/min   GFR calc Af Amer 3 (L) >90 mL/min    Comment: (NOTE) The eGFR has been calculated using the CKD EPI equation. This calculation has not been validated in all clinical situations. eGFR's persistently <90 mL/min signify possible Chronic Kidney Disease.    Anion gap 15 5 - 15   Troponin I     Status: Abnormal   Collection Time: 09/29/14  4:23 PM  Result Value Ref Range   Troponin I 0.04 (H) <0.031 ng/mL    Comment:        PERSISTENTLY INCREASED TROPONIN VALUES IN THE RANGE OF 0.04-0.49 ng/mL CAN BE SEEN IN:       -UNSTABLE ANGINA       -CONGESTIVE HEART FAILURE       -MYOCARDITIS       -CHEST TRAUMA       -ARRYHTHMIAS       -LATE PRESENTING MYOCARDIAL INFARCTION       -COPD   CLINICAL FOLLOW-UP RECOMMENDED.     Dg Chest 2 View  09/29/2014   CLINICAL DATA:  Productive cough, congestion and upper abdominal pain for 2 days  EXAM: CHEST  2 VIEW  COMPARISON:  02/14/2014  FINDINGS: There is moderate pulmonary hyperinflation. There is mild cardiomegaly. There is no acute airspace opacity. There are mild basilar linear scar is bilaterally. There are no effusions.  IMPRESSION: Hyperinflation and cardiomegaly.  No acute cardiopulmonary findings.   Electronically Signed   By: Andreas Newport M.D.   On: 09/29/2014 02:46   Dg Chest Port 1 View  09/29/2014   CLINICAL DATA:  Productive cough.  EXAM: PORTABLE CHEST - 1 VIEW  COMPARISON:  Film earlier today at 0226 hours  FINDINGS: The heart size is stable and within normal limits. The patient has had prior CABG. Mild bibasilar atelectasis present. There is no evidence of pulmonary edema, consolidation, pneumothorax, nodule or pleural fluid.  IMPRESSION: Bibasilar atelectasis.  No active disease.   Electronically Signed   By: Aletta Edouard M.D.   On: 09/29/2014 16:47    Review of Systems  Constitutional: Positive for chills. Negative for  fever.  Respiratory: Positive for cough, sputum production, shortness of breath and wheezing.   Cardiovascular: Positive for orthopnea. Negative for palpitations and leg swelling.  Gastrointestinal: Positive for nausea. Negative for vomiting and abdominal pain.  Neurological: Positive for weakness.   Blood pressure 201/88, pulse 94, temperature 98.5 F (36.9 C), temperature source  Oral, resp. rate 19, height 5' 1"  (1.549 m), weight 66.679 kg (147 lb), SpO2 96 %. Physical Exam  Constitutional: No distress.  HENT:  Mouth/Throat: No oropharyngeal exudate.  Eyes: No scleral icterus.  Neck: No JVD present.  Cardiovascular: Normal rate and regular rhythm.   Murmur heard. Respiratory: She has no wheezes. She has rales.  Decrease breath sound bilaterally    Assessment/Plan: Problem #1 history of cough with sputum production. Probably bronchitis. Presently still she complains of cough. She has also some difficulty breathing. Problem  #2 end-stage renal disease: She is status post hemodialysis on Friday. Presently she doesn't have any uremic signs and symptoms. Her potassium however is high. Problem #3 accelerated hypertension: Her blood pressure at this moment is high. Patient to only labetalol this morning. Her blood pressure however fluctuates most of the time. Problem #4 difficulty breathing: Possibly associated with flash pulmonary edema from uncontrolled hypertension/bronchitis. Problem #5 anemia: Her hemoglobin is below our target goal Problem #6 history of coronary artery disease Problem #7 history of moderate mitral regurgitation Plan: We will make arrangements for patient to get dialysis today We will use 2 K/2.5 calcium bath for 4 hours We'll try to remove those 3 L if her blood pressure tolerates. We'll check her basic metabolic panel and CBC in the morning.  Suzette Flagler S 09/29/2014, 5:33 PM

## 2014-09-30 DIAGNOSIS — N186 End stage renal disease: Secondary | ICD-10-CM | POA: Diagnosis present

## 2014-09-30 DIAGNOSIS — I2510 Atherosclerotic heart disease of native coronary artery without angina pectoris: Secondary | ICD-10-CM | POA: Diagnosis present

## 2014-09-30 DIAGNOSIS — M898X9 Other specified disorders of bone, unspecified site: Secondary | ICD-10-CM | POA: Diagnosis present

## 2014-09-30 DIAGNOSIS — E8770 Fluid overload, unspecified: Secondary | ICD-10-CM | POA: Diagnosis present

## 2014-09-30 DIAGNOSIS — I132 Hypertensive heart and chronic kidney disease with heart failure and with stage 5 chronic kidney disease, or end stage renal disease: Secondary | ICD-10-CM | POA: Diagnosis present

## 2014-09-30 DIAGNOSIS — J069 Acute upper respiratory infection, unspecified: Secondary | ICD-10-CM | POA: Diagnosis not present

## 2014-09-30 DIAGNOSIS — Z7982 Long term (current) use of aspirin: Secondary | ICD-10-CM | POA: Diagnosis not present

## 2014-09-30 DIAGNOSIS — J209 Acute bronchitis, unspecified: Secondary | ICD-10-CM | POA: Diagnosis present

## 2014-09-30 DIAGNOSIS — G251 Drug-induced tremor: Secondary | ICD-10-CM | POA: Diagnosis present

## 2014-09-30 DIAGNOSIS — D631 Anemia in chronic kidney disease: Secondary | ICD-10-CM | POA: Diagnosis present

## 2014-09-30 DIAGNOSIS — Z90710 Acquired absence of both cervix and uterus: Secondary | ICD-10-CM | POA: Diagnosis not present

## 2014-09-30 DIAGNOSIS — Z955 Presence of coronary angioplasty implant and graft: Secondary | ICD-10-CM | POA: Diagnosis not present

## 2014-09-30 DIAGNOSIS — N2581 Secondary hyperparathyroidism of renal origin: Secondary | ICD-10-CM | POA: Diagnosis present

## 2014-09-30 DIAGNOSIS — J9601 Acute respiratory failure with hypoxia: Secondary | ICD-10-CM | POA: Diagnosis not present

## 2014-09-30 DIAGNOSIS — K219 Gastro-esophageal reflux disease without esophagitis: Secondary | ICD-10-CM | POA: Diagnosis present

## 2014-09-30 DIAGNOSIS — Z8543 Personal history of malignant neoplasm of ovary: Secondary | ICD-10-CM | POA: Diagnosis not present

## 2014-09-30 DIAGNOSIS — T424X5A Adverse effect of benzodiazepines, initial encounter: Secondary | ICD-10-CM | POA: Diagnosis present

## 2014-09-30 DIAGNOSIS — I5032 Chronic diastolic (congestive) heart failure: Secondary | ICD-10-CM | POA: Diagnosis present

## 2014-09-30 DIAGNOSIS — Z85038 Personal history of other malignant neoplasm of large intestine: Secondary | ICD-10-CM | POA: Diagnosis not present

## 2014-09-30 DIAGNOSIS — R0600 Dyspnea, unspecified: Secondary | ICD-10-CM | POA: Insufficient documentation

## 2014-09-30 DIAGNOSIS — Z8673 Personal history of transient ischemic attack (TIA), and cerebral infarction without residual deficits: Secondary | ICD-10-CM | POA: Diagnosis not present

## 2014-09-30 DIAGNOSIS — I248 Other forms of acute ischemic heart disease: Secondary | ICD-10-CM | POA: Diagnosis present

## 2014-09-30 DIAGNOSIS — J9600 Acute respiratory failure, unspecified whether with hypoxia or hypercapnia: Secondary | ICD-10-CM | POA: Diagnosis present

## 2014-09-30 DIAGNOSIS — Z9115 Patient's noncompliance with renal dialysis: Secondary | ICD-10-CM | POA: Diagnosis present

## 2014-09-30 DIAGNOSIS — Z951 Presence of aortocoronary bypass graft: Secondary | ICD-10-CM | POA: Diagnosis not present

## 2014-09-30 DIAGNOSIS — I340 Nonrheumatic mitral (valve) insufficiency: Secondary | ICD-10-CM | POA: Diagnosis present

## 2014-09-30 DIAGNOSIS — Z992 Dependence on renal dialysis: Secondary | ICD-10-CM | POA: Diagnosis not present

## 2014-09-30 DIAGNOSIS — E871 Hypo-osmolality and hyponatremia: Secondary | ICD-10-CM | POA: Diagnosis present

## 2014-09-30 DIAGNOSIS — I252 Old myocardial infarction: Secondary | ICD-10-CM | POA: Diagnosis not present

## 2014-09-30 DIAGNOSIS — Z79899 Other long term (current) drug therapy: Secondary | ICD-10-CM | POA: Diagnosis not present

## 2014-09-30 DIAGNOSIS — E875 Hyperkalemia: Secondary | ICD-10-CM | POA: Diagnosis present

## 2014-09-30 LAB — TROPONIN I
TROPONIN I: 0.09 ng/mL — AB (ref <0.031)
TROPONIN I: 0.12 ng/mL — AB (ref <0.031)
TROPONIN I: 0.12 ng/mL — AB (ref <0.031)
Troponin I: 0.16 ng/mL — ABNORMAL HIGH (ref <0.031)

## 2014-09-30 LAB — CBC WITH DIFFERENTIAL/PLATELET
BASOS ABS: 0 10*3/uL (ref 0.0–0.1)
BASOS PCT: 0 % (ref 0–1)
Eosinophils Absolute: 0.3 10*3/uL (ref 0.0–0.7)
Eosinophils Relative: 2 % (ref 0–5)
HEMATOCRIT: 34.9 % — AB (ref 36.0–46.0)
Hemoglobin: 11.3 g/dL — ABNORMAL LOW (ref 12.0–15.0)
Lymphocytes Relative: 4 % — ABNORMAL LOW (ref 12–46)
Lymphs Abs: 0.7 10*3/uL (ref 0.7–4.0)
MCH: 32.8 pg (ref 26.0–34.0)
MCHC: 32.4 g/dL (ref 30.0–36.0)
MCV: 101.5 fL — AB (ref 78.0–100.0)
MONO ABS: 0.8 10*3/uL (ref 0.1–1.0)
Monocytes Relative: 5 % (ref 3–12)
Neutro Abs: 13.6 10*3/uL — ABNORMAL HIGH (ref 1.7–7.7)
Neutrophils Relative %: 89 % — ABNORMAL HIGH (ref 43–77)
Platelets: 252 10*3/uL (ref 150–400)
RBC: 3.44 MIL/uL — ABNORMAL LOW (ref 3.87–5.11)
RDW: 15.2 % (ref 11.5–15.5)
WBC: 15.4 10*3/uL — ABNORMAL HIGH (ref 4.0–10.5)

## 2014-09-30 LAB — MAGNESIUM: MAGNESIUM: 1.7 mg/dL (ref 1.5–2.5)

## 2014-09-30 LAB — COMPREHENSIVE METABOLIC PANEL
ALT: 26 U/L (ref 0–35)
ANION GAP: 14 (ref 5–15)
AST: 32 U/L (ref 0–37)
Albumin: 3.9 g/dL (ref 3.5–5.2)
Alkaline Phosphatase: 101 U/L (ref 39–117)
BILIRUBIN TOTAL: 0.9 mg/dL (ref 0.3–1.2)
BUN: 23 mg/dL (ref 6–23)
CO2: 27 mmol/L (ref 19–32)
Calcium: 8.9 mg/dL (ref 8.4–10.5)
Chloride: 93 mmol/L — ABNORMAL LOW (ref 96–112)
Creatinine, Ser: 6.02 mg/dL — ABNORMAL HIGH (ref 0.50–1.10)
GFR calc non Af Amer: 6 mL/min — ABNORMAL LOW (ref >90)
GFR, EST AFRICAN AMERICAN: 7 mL/min — AB (ref >90)
Glucose, Bld: 100 mg/dL — ABNORMAL HIGH (ref 70–99)
POTASSIUM: 3.7 mmol/L (ref 3.5–5.1)
SODIUM: 134 mmol/L — AB (ref 135–145)
TOTAL PROTEIN: 7.2 g/dL (ref 6.0–8.3)

## 2014-09-30 LAB — INFLUENZA PANEL BY PCR (TYPE A & B)
H1N1 flu by pcr: NOT DETECTED
Influenza A By PCR: NEGATIVE
Influenza B By PCR: NEGATIVE

## 2014-09-30 LAB — HEPATITIS B SURFACE ANTIGEN: Hepatitis B Surface Ag: NEGATIVE

## 2014-09-30 MED ORDER — METHYLPREDNISOLONE SODIUM SUCC 125 MG IJ SOLR
125.0000 mg | INTRAMUSCULAR | Status: DC
  Administered 2014-09-30: 125 mg via INTRAVENOUS
  Filled 2014-09-30: qty 2

## 2014-09-30 MED ORDER — PANTOPRAZOLE SODIUM 40 MG PO TBEC: 40.0000 mg | DELAYED_RELEASE_TABLET | Freq: Every day | ORAL | Status: DC

## 2014-09-30 MED ORDER — IPRATROPIUM-ALBUTEROL 0.5-2.5 (3) MG/3ML IN SOLN
3.0000 mL | RESPIRATORY_TRACT | Status: DC
  Administered 2014-09-30 (×5): 3 mL via RESPIRATORY_TRACT
  Filled 2014-09-30 (×5): qty 3

## 2014-09-30 MED ORDER — DOXYCYCLINE HYCLATE 100 MG IV SOLR
100.0000 mg | Freq: Two times a day (BID) | INTRAVENOUS | Status: DC
  Administered 2014-09-30 – 2014-10-02 (×4): 100 mg via INTRAVENOUS
  Filled 2014-09-30 (×7): qty 100

## 2014-09-30 MED ORDER — AMLODIPINE BESYLATE 5 MG PO TABS
10.0000 mg | ORAL_TABLET | Freq: Every day | ORAL | Status: DC
  Filled 2014-09-30 (×3): qty 2

## 2014-09-30 MED ORDER — ISOSORBIDE DINITRATE 20 MG PO TABS
20.0000 mg | ORAL_TABLET | Freq: Three times a day (TID) | ORAL | Status: DC
  Administered 2014-09-30: 20 mg via ORAL
  Filled 2014-09-30: qty 1

## 2014-09-30 MED ORDER — LABETALOL HCL 200 MG PO TABS
200.0000 mg | ORAL_TABLET | Freq: Two times a day (BID) | ORAL | Status: DC
  Administered 2014-10-02: 200 mg via ORAL
  Filled 2014-09-30 (×3): qty 1

## 2014-09-30 MED ORDER — HEPARIN SODIUM (PORCINE) 5000 UNIT/ML IJ SOLN
5000.0000 [IU] | Freq: Three times a day (TID) | INTRAMUSCULAR | Status: DC
  Administered 2014-09-30 – 2014-10-02 (×6): 5000 [IU] via SUBCUTANEOUS
  Filled 2014-09-30 (×6): qty 1

## 2014-09-30 MED ORDER — CETYLPYRIDINIUM CHLORIDE 0.05 % MT LIQD
7.0000 mL | Freq: Two times a day (BID) | OROMUCOSAL | Status: DC
  Administered 2014-09-30 – 2014-10-01 (×4): 7 mL via OROMUCOSAL

## 2014-09-30 MED ORDER — GUAIFENESIN ER 600 MG PO TB12
600.0000 mg | ORAL_TABLET | Freq: Two times a day (BID) | ORAL | Status: DC | PRN
  Administered 2014-09-30 – 2014-10-02 (×3): 600 mg via ORAL
  Filled 2014-09-30 (×3): qty 1

## 2014-09-30 MED ORDER — OMEPRAZOLE 20 MG PO CPDR
20.0000 mg | DELAYED_RELEASE_CAPSULE | Freq: Every day | ORAL | Status: DC
  Administered 2014-09-30 – 2014-10-02 (×3): 20 mg via ORAL
  Filled 2014-09-30 (×4): qty 1

## 2014-09-30 MED ORDER — SODIUM CHLORIDE 0.9 % IJ SOLN
3.0000 mL | Freq: Two times a day (BID) | INTRAMUSCULAR | Status: DC
  Administered 2014-09-30 – 2014-10-01 (×4): 3 mL via INTRAVENOUS

## 2014-09-30 MED ORDER — HYDRALAZINE HCL 25 MG PO TABS
25.0000 mg | ORAL_TABLET | Freq: Three times a day (TID) | ORAL | Status: DC
  Administered 2014-09-30 – 2014-10-02 (×5): 25 mg via ORAL
  Filled 2014-09-30 (×5): qty 1

## 2014-09-30 MED ORDER — DOXYCYCLINE HYCLATE 100 MG IV SOLR
INTRAVENOUS | Status: AC
  Filled 2014-09-30: qty 100

## 2014-09-30 MED ORDER — CINACALCET HCL 30 MG PO TABS
30.0000 mg | ORAL_TABLET | Freq: Every day | ORAL | Status: DC
  Administered 2014-09-30 – 2014-10-01 (×2): 30 mg via ORAL
  Filled 2014-09-30 (×3): qty 1

## 2014-09-30 MED ORDER — ISOSORBIDE DINITRATE 20 MG PO TABS
30.0000 mg | ORAL_TABLET | Freq: Three times a day (TID) | ORAL | Status: DC
  Administered 2014-09-30 – 2014-10-01 (×4): 30 mg via ORAL
  Filled 2014-09-30 (×5): qty 2

## 2014-09-30 MED ORDER — LANTHANUM CARBONATE 500 MG PO CHEW
1000.0000 mg | CHEWABLE_TABLET | Freq: Three times a day (TID) | ORAL | Status: DC
  Administered 2014-09-30 – 2014-10-02 (×6): 1000 mg via ORAL
  Filled 2014-09-30 (×11): qty 2

## 2014-09-30 MED ORDER — ASPIRIN EC 81 MG PO TBEC
81.0000 mg | DELAYED_RELEASE_TABLET | Freq: Every day | ORAL | Status: DC
  Administered 2014-09-30 – 2014-10-02 (×3): 81 mg via ORAL
  Filled 2014-09-30 (×3): qty 1

## 2014-09-30 MED ORDER — METHYLPREDNISOLONE SODIUM SUCC 125 MG IJ SOLR
60.0000 mg | Freq: Two times a day (BID) | INTRAMUSCULAR | Status: DC
  Administered 2014-09-30 – 2014-10-01 (×2): 60 mg via INTRAVENOUS
  Filled 2014-09-30 (×2): qty 2

## 2014-09-30 MED ORDER — SODIUM CHLORIDE 0.9 % IV SOLN: 250.0000 mL | INTRAVENOUS | Status: DC | PRN

## 2014-09-30 MED ORDER — HYDRALAZINE HCL 20 MG/ML IJ SOLN: 5.0000 mg | INTRAMUSCULAR | Status: DC | PRN

## 2014-09-30 MED ORDER — SODIUM CHLORIDE 0.9 % IJ SOLN: 3.0000 mL | INTRAMUSCULAR | Status: DC | PRN

## 2014-09-30 MED ORDER — NITROGLYCERIN 0.4 MG SL SUBL: 0.4000 mg | SUBLINGUAL_TABLET | SUBLINGUAL | Status: DC | PRN

## 2014-09-30 MED ORDER — AMLODIPINE BESYLATE 5 MG PO TABS: 10.0000 mg | ORAL_TABLET | Freq: Every day | ORAL | Status: DC

## 2014-09-30 MED ORDER — IPRATROPIUM-ALBUTEROL 0.5-2.5 (3) MG/3ML IN SOLN
3.0000 mL | RESPIRATORY_TRACT | Status: DC
  Administered 2014-10-01: 3 mL via RESPIRATORY_TRACT
  Filled 2014-09-30 (×3): qty 3

## 2014-09-30 MED ORDER — ALPRAZOLAM 0.5 MG PO TABS
0.5000 mg | ORAL_TABLET | Freq: Three times a day (TID) | ORAL | Status: DC
  Administered 2014-09-30 – 2014-10-02 (×7): 0.5 mg via ORAL
  Filled 2014-09-30 (×7): qty 1

## 2014-09-30 MED ORDER — SODIUM CHLORIDE 0.9 % IJ SOLN
3.0000 mL | Freq: Two times a day (BID) | INTRAMUSCULAR | Status: DC
  Administered 2014-10-01: 3 mL via INTRAVENOUS

## 2014-09-30 MED ORDER — IPRATROPIUM-ALBUTEROL 0.5-2.5 (3) MG/3ML IN SOLN
3.0000 mL | RESPIRATORY_TRACT | Status: DC | PRN
  Administered 2014-10-01 – 2014-10-02 (×2): 3 mL via RESPIRATORY_TRACT
  Filled 2014-09-30 (×2): qty 3

## 2014-09-30 MED ORDER — CLONIDINE HCL 0.2 MG PO TABS
0.2000 mg | ORAL_TABLET | Freq: Two times a day (BID) | ORAL | Status: DC
  Administered 2014-09-30 – 2014-10-01 (×3): 0.2 mg via ORAL
  Filled 2014-09-30 (×5): qty 1

## 2014-09-30 MED ORDER — SIMVASTATIN 20 MG PO TABS
20.0000 mg | ORAL_TABLET | Freq: Every day | ORAL | Status: DC
  Administered 2014-09-30 – 2014-10-01 (×2): 20 mg via ORAL
  Filled 2014-09-30 (×2): qty 1

## 2014-09-30 MED ORDER — CLONIDINE HCL 0.2 MG PO TABS: 0.2000 mg | ORAL_TABLET | Freq: Two times a day (BID) | ORAL | Status: DC

## 2014-09-30 MED ORDER — FLUTICASONE PROPIONATE 50 MCG/ACT NA SUSP
1.0000 | Freq: Every day | NASAL | Status: DC
  Administered 2014-09-30 – 2014-10-01 (×2): 1 via NASAL
  Filled 2014-09-30: qty 16

## 2014-09-30 NOTE — Progress Notes (Signed)
[facility] PROGRESS NOTE  Shawna [name] [id number] DOB: 1940/10/29 DOA: 09/29/2014 PCP: [name], MD  Assessment/Plan: 1. Acute respiratory failure 1. Suspected bronchitis 2. Pt is continued on IV doxycycline, scheduled solumedrol and bronchodilators 3. Still wheezing on exam 4. Remains on 3LNC 5. Flu testing under way 6. Afebrile, mild leukocytosis, but very likely secondary to high dose IV steroids 2. HTN 1. BP poorly controlled with sbp into the mid 200's overnight 2. Continued home meds 3. Have increased isordil from 20mg  TID to 30mg  TID 4. Cont PRN PRN hydralazine 3. Elevated troponin 1. Suspect related to ESRD 2. EKG unremarkable 3. No chest pain and pt essentially is asymptomatic 4. Continue to follow Trop trends. If pt becomes symptomatic, consider formal Cardiology consultation at that time 4. Diastolic CHF, chronic 1. Stable 2. Appears euvolemic at this time 5. CAD 1. Pain free currently 2. Follow cardiac enzymes per above 3. EKG unremarkable 6. DVT prophylaxis 1. Heparin subQ  Code Status: Full Family Communication: Pt in room Disposition Plan: Pending   Consultants:  Nephrology  Procedures:    Antibiotics:  Doxycycline 3/22>>>  HPI/Subjective: Pt reports feeling better today.  Objective: Filed Vitals:   09/30/14 0519 09/30/14 0739 09/30/14 1117 09/30/14 1446  BP: 172/53     Pulse: 90     Temp: 98 F (36.7 C)     TempSrc: Oral     Resp: 20     Height:      Weight:      SpO2: 98% 94% 96% 98%    Intake/Output Summary (Last 24 hours) at 09/30/14 1558 Last data filed at 09/30/14 0021  Gross per 24 hour  Intake      0 ml  Output   1841 ml  Net  -1841 ml   Filed Weights   09/29/14 1216 09/29/14 2004 09/30/14 0021  Weight: 66.679 kg (147 lb) 67.1 kg (147 lb 14.9 oz) 66.4 kg (146 lb 6.2 oz)    Exam:   General:  Awake, in nad  Cardiovascular: regular, s1, s2  Respiratory: normal resp effort, end-expiratory  wheezing  Abdomen: soft,nondistended  Musculoskeletal: perfused, no clubbing   Data Reviewed: Basic Metabolic Panel:  Recent Labs Lab 09/29/14 1232 09/30/14 0345  NA 135 134*  K 5.5* 3.7  CL 97 93*  CO2 23 27  GLUCOSE 114* 100*  BUN 73* 23  CREATININE 12.46* 6.02*  CALCIUM 8.6 8.9   Liver Function Tests:  Recent Labs Lab 09/30/14 0345  AST 32  ALT 26  ALKPHOS 101  BILITOT 0.9  PROT 7.2  ALBUMIN 3.9   No results for input(s): LIPASE, AMYLASE in the last 168 hours. No results for input(s): AMMONIA in the last 168 hours. CBC:  Recent Labs Lab 09/29/14 1232 09/30/14 0345  WBC 10.4 15.4*  NEUTROABS  --  13.6*  HGB 9.8* 11.3*  HCT 30.5* 34.9*  MCV 101.3* 101.5*  PLT 256 252   Cardiac Enzymes:  Recent Labs Lab 09/29/14 1623 09/30/14 0345 09/30/14 1008  TROPONINI 0.04* 0.09* 0.16*   BNP (last 3 results) No results for input(s): BNP in the last 8760 hours.  ProBNP (last 3 results) No results for input(s): PROBNP in the last 8760 hours.  CBG: No results for input(s): GLUCAP in the last 168 hours.  No results found for this or any previous visit (from the past 240 hour(s)).   Studies: Dg Chest 2 View  09/29/2014   CLINICAL DATA:  Productive cough, congestion and upper abdominal pain  for 2 days  EXAM: CHEST  2 VIEW  COMPARISON:  02/14/2014  FINDINGS: There is moderate pulmonary hyperinflation. There is mild cardiomegaly. There is no acute airspace opacity. There are mild basilar linear scar is bilaterally. There are no effusions.  IMPRESSION: Hyperinflation and cardiomegaly.  No acute cardiopulmonary findings.   Electronically Signed   By: Andreas Newport M.D.   On: 09/29/2014 02:46   Dg Chest Port 1 View  09/29/2014   CLINICAL DATA:  Productive cough.  EXAM: PORTABLE CHEST - 1 VIEW  COMPARISON:  Film earlier today at 0226 hours  FINDINGS: The heart size is stable and within normal limits. The patient has had prior CABG. Mild bibasilar atelectasis  present. There is no evidence of pulmonary edema, consolidation, pneumothorax, nodule or pleural fluid.  IMPRESSION: Bibasilar atelectasis.  No active disease.   Electronically Signed   By: Aletta Edouard M.D.   On: 09/29/2014 16:47    Scheduled Meds: . ALPRAZolam  0.5 mg Oral TID  . amLODipine  10 mg Oral Daily  . antiseptic oral rinse  7 mL Mouth Rinse BID  . aspirin EC  81 mg Oral Daily  . cinacalcet  30 mg Oral Q supper  . cloNIDine  0.2 mg Oral BID  . doxycycline (VIBRAMYCIN) IV  100 mg Intravenous Q12H  . heparin  5,000 Units Subcutaneous 3 times per day  . hydrALAZINE  25 mg Oral TID  . ipratropium-albuterol  3 mL Nebulization Q4H  . isosorbide dinitrate  30 mg Oral TID  . labetalol  200 mg Oral BID  . lanthanum  1,000 mg Oral TID PC  . [START ON 10/01/2014] methylPREDNISolone (SOLU-MEDROL) injection  60 mg Intravenous Q12H  . omeprazole  20 mg Oral Daily  . simvastatin  20 mg Oral QHS  . sodium chloride  3 mL Intravenous Q12H  . sodium chloride  3 mL Intravenous Q12H   Continuous Infusions:   Active Problems:   ESRD (end stage renal disease)   Acute respiratory failure   CHIU, STEPHEN K  [facility] Pager [phone]. If 7PM-7AM, please contact night-coverage at [email or web address], password [facility] 09/30/2014, 3:58 PM

## 2014-09-30 NOTE — H&P (Addendum)
Hospitalist Admission History and Physical  Patient name: Shawna [name] Medical record number: [id number] Date of birth: Sep 07, 1940 Age: 74 y.o. Gender: female  Primary Care Provider: [name], MD  Chief Complaint: acute resp failure, volume overload   History of Present Illness:This is a 74 y.o. year old female with significant past medical history of ESRD on HD , CAD, diastolic CHF, chronic bronchitis presenting with acute resp failure, volume overload. Per report, pt with worsening cough over past 1-2 days at home. + subjective chills at home, wheezing, SOB. Missed HD yesterday because of sxs.  Was seen in ER for sxs 3/21 x 2 [facility] @ [place], Lacinda Axon @ [id number]), was given Rx for mucinex and subsquently sent home from am ED visit. Came back to ER later in day because of persistence of sxs. Noted mild hyperkalemia @ 5.5. Renal consulted for HD. CXR w/ bibasilar atelectasis. Noted trop 0.04. EKG NSR-LVH. No reports of CP. CXR negative for acute infiltrate though w/ bibasilar atelectasis. Was sent upstairs w/ temp admission orders by EDP. However, no hospitalist admission order placed. Called by floor nurse @ 3am about elevated BP s/p HD.  Currently, pt still w/ mild cough, wheezing, sputum production. No active CP. SOB mildly improved. Afebrile. BP 120s-220s. Currently in 210s. Asymptomatic at present.   Assessment and Plan: Shawna [name] is a 74 y.o. year old female presenting with Acute resp failure, ESRD   Active Problems:   ESRD (end stage renal disease)   Acute respiratory failure   1-Acute resp failure -likely multifactorial in setting of chronic bronchitis and ESRD -s/p HD last night w/ mild-moderate improvement in sxs.  -suspect mild chronic bronchitic flare concominantly  -IV solumedrol  -duonebs -IV doxycycline -no infiltrate, elevated temp, hypoxia, leukocytosis concerning for HCAP at present -follow closely  2-Hypertension -accelerated-asymptomatic -discussed case w/ Dr.  Lowanda Foster -restart oral regimen  -follow  3-Elevated trop  -noted in ER from yesterday  -no active CP currently  -EKG NSR -likely mild demand ischemia/strain in setting of above and baseline ESRD (poor cardiac marker clearance) -cont ASA -cont NTG -cycle CEs -tele bed  -follow  -cards consult as clinically indicated   4-Diastolic CHF -s/p HD last night - - 1.8 L per HD report -dry on exam -strict I and Os and faily weights -follow   5- CAD -noted elevated trop in ER today -asymptomatic  -cont home regimen -cycle CEs  -follow    FEN/GI: renal diet. PPI  Prophylaxis: sub q heparin  Disposition: pending further evaluation  Code Status:Full Code    Patient Active Problem List   Diagnosis Date Noted  . Acute respiratory failure 09/30/2014  . ESRD (end stage renal disease) 09/29/2014  . Chest pain, cardiac   . Hyperkalemia 08/24/2014  . Pain in joint, lower leg 08/14/2014  . Chest pain 11/26/2013  . Small bowel obstruction, partial 05/29/2013  . Chronic diastolic CHF (congestive heart failure) 03/22/2013  . GI bleeding 03/21/2013  . Acute blood loss anemia 03/21/2013  . Vaginal odor 03/12/2013  . Vaginal discharge 03/12/2013  . Abdominal  pain, other specified site 03/12/2013  . Occlusion and stenosis of carotid artery without mention of cerebral infarction 01/24/2013  . Hx of CABG 07/05/2012  . Carotid artery disease 07/05/2012  . Anemia of chronic kidney failure 07/03/2012  . Secondary hyperparathyroidism 07/03/2012  . Mitral regurgitation 06/12/2012  . Pneumonia 06/09/2012  . Non-STEMI (non-ST elevated myocardial infarction) 06/08/2012  . GERD (gastroesophageal reflux disease) 01/09/2012  . HLD (hyperlipidemia)  01/05/2012  . Coronary atherosclerosis of native coronary artery 12/16/2011  . Essential hypertension, benign 12/16/2011  . ESRD on hemodialysis 12/16/2011   Past Medical History: Past Medical History  Diagnosis Date  . Chronic bronchitis   .  GERD (gastroesophageal reflux disease)   . PUD (peptic ulcer disease)   . History of lower GI bleeding   . Arthritis   . History of gout   . CAD (coronary artery disease)     a. 12/2011 NSTEMI/Cath/PCI LCX (2.25x14 Resolute DES) & D1 (2.25x22 Resolute DES);  b. 01/2012 Cath/PCI: LM 30, LAD 30p, 40-30m, D1 stent ok, 99 in sm branch of diag, LCX patent stent, OM1 20, RCA 95 ost (4.0x12 Promus DES), EF 55%;  c. 04/2012 Lexi Cardiolite  EF 48%, small area of scar @ base/mid inflat wall with mild peri-infarct ischemia.; CABG 12/4  . High cholesterol 12/2011  . Pneumonia ~ 2009  . Iron deficiency anemia   . TIA (transient ischemic attack)   . Anxiety   . History of blood transfusion 07/2011; 12/2011; 01/2012 X 2; 04/2012  . Carotid artery disease     a. [id number] LICA, [id number]   . Mitral regurgitation     a. Moderate by echo, 02/2012  . Myocardial infarction   . Chronic diastolic CHF (congestive heart failure)     a. 02/2012 Echo EF 60-65%, nl wall motion, Gr 1 DD, mod MR  . Hypertension   . AVM (arteriovenous malformation) of colon   . Esophageal stricture   . Ovarian cancer 1992  . Colon cancer 1992  . ESRD on hemodialysis     ESRD due to HTN, started dialysis 2011 and gets HD at [facility] with Dr Hinda Lenis on MWF schedule.  Access is LUA AVF as of Sept 2014.     Past Surgical History: Past Surgical History  Procedure Laterality Date  . Abdominal hysterectomy  1992  . Appendectomy  06/1990  . Tubal ligation  1980's  . Av fistula placement  07/2009    left upper arm  . Thrombectomy / arteriovenous graft revision  2011    left upper arm  . Colon resection  1992  . Esophagogastroduodenoscopy  01/20/2012    Procedure: ESOPHAGOGASTRODUODENOSCOPY (EGD);  Surgeon: Ladene Artist, MD,FACG;  Location: [facility] ENDOSCOPY;  Service: Endoscopy;  Laterality: N/A;  . Dilation and curettage of uterus    . Coronary angioplasty with stent placement  12/15/11    "2"  . Coronary angioplasty with stent placement   y/2013    "1; makes total of 3" (05/02/2012)  . Coronary artery bypass graft  06/13/2012    Procedure: CORONARY ARTERY BYPASS GRAFTING (CABG);  Surgeon: Grace Isaac, MD;  Location: [place];  Service: Open Heart Surgery;  Laterality: N/A;  cabg x four;  using left internal mammary artery, and left leg greater saphenous vein harvested endoscopically  . Intraoperative transesophageal echocardiogram  06/13/2012    Procedure: INTRAOPERATIVE TRANSESOPHAGEAL ECHOCARDIOGRAM;  Surgeon: Grace Isaac, MD;  Location: [place];  Service: Open Heart Surgery;  Laterality: N/A;  . Esophagogastroduodenoscopy N/A 03/26/2013    Procedure: ESOPHAGOGASTRODUODENOSCOPY (EGD);  Surgeon: Irene Shipper, MD;  Location: [facility] ENDOSCOPY;  Service: Endoscopy;  Laterality: N/A;  . Ovary surgery      ovarian cancer  . Cardiac surgery    . Left heart catheterization with coronary angiogram N/A 12/15/2011    Procedure: LEFT HEART CATHETERIZATION WITH CORONARY ANGIOGRAM;  Surgeon: Burnell Blanks, MD;  Location: [facility] CATH LAB;  Service: Cardiovascular;  Laterality: N/A;  . Left heart catheterization with coronary angiogram N/A 01/10/2012    Procedure: LEFT HEART CATHETERIZATION WITH CORONARY ANGIOGRAM;  Surgeon: Peter M Martinique, MD;  Location: [facility] CATH LAB;  Service: Cardiovascular;  Laterality: N/A;  . Left heart catheterization with coronary angiogram N/A 06/08/2012    Procedure: LEFT HEART CATHETERIZATION WITH CORONARY ANGIOGRAM;  Surgeon: Burnell Blanks, MD;  Location: [facility] CATH LAB;  Service: Cardiovascular;  Laterality: N/A;  . Shuntogram N/A 10/15/2013    Procedure: Fistulogram;  Surgeon: Serafina Mitchell, MD;  Location: [facility] CATH LAB;  Service: Cardiovascular;  Laterality: N/A;  . Left heart catheterization with coronary/graft angiogram N/A 12/10/2013    Procedure: LEFT HEART CATHETERIZATION WITH Beatrix Fetters;  Surgeon: [name], MD;  Location: [facility] CATH LAB;  Service: Cardiovascular;  Laterality: N/A;     Social History: History   Social History  . Marital Status: Married    Spouse Name: N/A  . Number of Children: N/A  . Years of Education: N/A   Social History Main Topics  . Smoking status: Never Smoker   . Smokeless tobacco: Never Used  . Alcohol Use: No  . Drug Use: No  . Sexual Activity: Yes    Birth Control/ Protection: Surgical   Other Topics Concern  . None   Social History Narrative   Lives in [place], [place] with husband.  Dialysis pt - mwf.    Family History: Family History  Problem Relation Age of Onset  . Other      noncontributory for early CAD  . Heart disease Mother     Heart Disease before age 36  . Hyperlipidemia Mother   . Hypertension Mother   . Diabetes Mother   . Heart attack Mother   . Heart disease Father     Heart Disease before age 64  . Hyperlipidemia Father   . Hypertension Father   . Diabetes Father   . Diabetes Sister   . Hypertension Sister   . Diabetes Brother   . Hyperlipidemia Brother   . Heart attack Brother   . Colon cancer Neg Hx   . Esophageal cancer Neg Hx   . Liver disease Neg Hx   . Kidney disease Neg Hx   . Colon polyps Neg Hx   . Hypertension Sister   . Heart attack Brother     Allergies: Allergies  Allergen Reactions  . Aspirin Other (See Comments)    Mess up her stomach; "makes my bowels have blood in them". Takes 81 mg EC Aspirin   . Contrast Media [Iodinated Diagnostic Agents] Itching  . Iron Itching and Other (See Comments)    "they gave me iron in dialysis; had to give me Benadryl cause I had to have the iron" (05/02/2012)  . Macrodantin [Nitrofurantoin Macrocrystal] Other (See Comments)    "broke me out in big old knots all over my body; had to go to ER"  . Penicillins Other (See Comments)    "makes me real weak when I take it; like I'll pass out"  . Plavix [Clopidogrel Bisulfate] Rash  . Bactrim [Sulfamethoxazole-Trimethoprim] Rash  . Sulfa Antibiotics Rash  . Venofer [Ferric Oxide] Itching     Patient reports using Benadryl prior to doses as [facility]  . Dexilant [Dexlansoprazole] Other (See Comments)    Upset stomach  . Morphine And Related     Itching in feet  . Prilosec [Omeprazole] Other (See Comments)    "back spasms"  .  Levaquin [Levofloxacin In D5w] Rash  . Protonix [Pantoprazole Sodium] Rash    Current Facility-Administered Medications  Medication Dose Route Frequency Provider Last Rate Last Dose  . 0.9 %  sodium chloride infusion  100 mL Intravenous PRN Fran Lowes, MD      . 0.9 %  sodium chloride infusion  100 mL Intravenous PRN Fran Lowes, MD      . 0.9 %  sodium chloride infusion  250 mL Intravenous PRN Deneise Lever, MD      . amLODipine (NORVASC) tablet 10 mg  10 mg Oral Daily Deneise Lever, MD      . cinacalcet [facility]) tablet 30 mg  30 mg Oral Q supper Deneise Lever, MD      . cloNIDine (CATAPRES) tablet 0.2 mg  0.2 mg Oral BID Deneise Lever, MD      . heparin injection 1,000 Units  1,000 Units Dialysis PRN Fran Lowes, MD      . heparin injection 5,000 Units  5,000 Units Subcutaneous 3 times per day Deneise Lever, MD      . hydrALAZINE (APRESOLINE) tablet 25 mg  25 mg Oral TID Deneise Lever, MD      . isosorbide dinitrate (ISORDIL) tablet 20 mg  20 mg Oral TID Deneise Lever, MD      . labetalol (NORMODYNE) tablet 200 mg  200 mg Oral BID Deneise Lever, MD      . lanthanum [facility]) chewable tablet 1,000 mg  1,000 mg Oral TID PC Deneise Lever, MD      . lidocaine (PF) (XYLOCAINE) 1 % injection 5 mL  5 mL Intradermal PRN Fran Lowes, MD      . lidocaine-prilocaine (EMLA) cream 1 application  1 application Topical PRN Fran Lowes, MD      . nitroGLYCERIN (NITROSTAT) SL tablet 0.4 mg  0.4 mg Sublingual Q5 Min x 3 PRN Deneise Lever, MD      . pantoprazole (PROTONIX) EC tablet 40 mg  40 mg Oral Daily Deneise Lever, MD      . pentafluoroprop-tetrafluoroeth (GEBAUERS) aerosol 1 application  1 application  Topical PRN Fran Lowes, MD      . simvastatin (ZOCOR) tablet 20 mg  20 mg Oral QHS Deneise Lever, MD      . sodium chloride 0.9 % injection 3 mL  3 mL Intravenous Q12H Deneise Lever, MD      . sodium chloride 0.9 % injection 3 mL  3 mL Intravenous Q12H Deneise Lever, MD      . sodium chloride 0.9 % injection 3 mL  3 mL Intravenous PRN Deneise Lever, MD       Review Of Systems: 12 point ROS negative except as noted above in HPI.  Physical Exam: Filed Vitals:   09/30/14 0222  BP: 217/87  Pulse: 108  Temp: 98 F (36.7 C)  Resp: 20    General: cooperative HEENT: PERRLA, extra ocular movement intact and dry oral mucosa Heart: S1, S2 normal, no murmur, rub or gallop, regular rate and rhythm Lungs: unlabored breathing, expiratory wheezes and faint rales  Abdomen: abdomen is soft without significant tenderness, masses, organomegaly or guarding Extremities: extremities normal, atraumatic, no cyanosis or edema Skin:no rashes Neurology: normal without focal findings  Labs and Imaging: Lab Results  Component Value Date/Time   NA 135 09/29/2014 12:32 PM   K 5.5* 09/29/2014 12:32 PM   CL 97 09/29/2014 12:32 PM  CO2 23 09/29/2014 12:32 PM   BUN 73* 09/29/2014 12:32 PM   CREATININE 12.46* 09/29/2014 12:32 PM   GLUCOSE 114* 09/29/2014 12:32 PM   Lab Results  Component Value Date   WBC 10.4 09/29/2014   HGB 9.8* 09/29/2014   HCT 30.5* 09/29/2014   MCV 101.3* 09/29/2014   PLT 256 09/29/2014    Dg Chest 2 View  09/29/2014   CLINICAL DATA:  Productive cough, congestion and upper abdominal pain for 2 days  EXAM: CHEST  2 VIEW  COMPARISON:  02/14/2014  FINDINGS: There is moderate pulmonary hyperinflation. There is mild cardiomegaly. There is no acute airspace opacity. There are mild basilar linear scar is bilaterally. There are no effusions.  IMPRESSION: Hyperinflation and cardiomegaly.  No acute cardiopulmonary findings.   Electronically Signed   By: Andreas Newport M.D.    On: 09/29/2014 02:46   Dg Chest Port 1 View  09/29/2014   CLINICAL DATA:  Productive cough.  EXAM: PORTABLE CHEST - 1 VIEW  COMPARISON:  Film earlier today at 0226 hours  FINDINGS: The heart size is stable and within normal limits. The patient has had prior CABG. Mild bibasilar atelectasis present. There is no evidence of pulmonary edema, consolidation, pneumothorax, nodule or pleural fluid.  IMPRESSION: Bibasilar atelectasis.  No active disease.   Electronically Signed   By: Aletta Edouard M.D.   On: 09/29/2014 16:47           Shanda Howells MD  Pager: [phone]

## 2014-09-30 NOTE — Progress Notes (Signed)
Patient did not get doxycycline due to loss of IV access. Nurse Supervisor is going to try and gain access, will notify MD if unable to gain IV access.

## 2014-09-30 NOTE — Progress Notes (Signed)
UR completed 

## 2014-09-30 NOTE — Care Management Note (Addendum)
    Page 1 of 1   10/02/2014     9:24:51 AM CARE MANAGEMENT NOTE 10/02/2014  Patient:  Shawna [name], Shawna [name]   Account Number:  [id number]  Date Initiated:  09/30/2014  Documentation initiated by:  Theophilus Kinds  Subjective/Objective Assessment:   Pt admitted from home with respiratory failure. Pt lives with her husband and will return home at discharge. Pt is independent with ADL's. Pt receives dialysis at [facility] in [place].     Action/Plan:   No CM needs noted. Anticipate discharge within 24 hours.   Anticipated DC Date:  10/01/2014   Anticipated DC Plan:  [place]  CM consult      Choice offered to / List presented to:             Status of service:  Completed, signed off Medicare Important Message given?  YES (If response is "NO", the following Medicare IM given date fields will be blank) Date Medicare IM given:  10/02/2014 Medicare IM given by:  Theophilus Kinds Date Additional Medicare IM given:   Additional Medicare IM given by:    Discharge Disposition:  HOME/SELF CARE  Per UR Regulation:    If discussed at Long Length of Stay Meetings, dates discussed:    Comments:  10/02/14 0920 Christinia Gully, RN BSN CM Pt discharged home today. No CM needs noted.  09/30/14 [place], RN BSN CM

## 2014-09-30 NOTE — Progress Notes (Signed)
Shawna [name]  MRN: [id number]  DOB/AGE: January 25, 1941 74 y.o.  Primary Care Physician:HOWARD, Lennette Bihari, MD  Admit date: 09/29/2014  Chief Complaint:  Chief Complaint  Patient presents with  . Cough    S-Pt presented on  09/29/2014 with  Chief Complaint  Patient presents with  . Cough  .    Pt today feels better from cough standpoint but c/o jerks. Pt is havinh involuntary movements of upper ext and upper body.  Meds . ALPRAZolam  0.5 mg Oral TID  . amLODipine  10 mg Oral Daily  . antiseptic oral rinse  7 mL Mouth Rinse BID  . aspirin EC  81 mg Oral Daily  . cinacalcet  30 mg Oral Q supper  . cloNIDine  0.2 mg Oral BID  . doxycycline (VIBRAMYCIN) IV  100 mg Intravenous Q12H  . heparin  5,000 Units Subcutaneous 3 times per day  . hydrALAZINE  25 mg Oral TID  . ipratropium-albuterol  3 mL Nebulization Q4H  . isosorbide dinitrate  20 mg Oral TID  . labetalol  200 mg Oral BID  . lanthanum  1,000 mg Oral TID PC  . methylPREDNISolone (SOLU-MEDROL) injection  125 mg Intravenous Q24H  . omeprazole  20 mg Oral Daily  . simvastatin  20 mg Oral QHS  . sodium chloride  3 mL Intravenous Q12H  . sodium chloride  3 mL Intravenous Q12H      Physical Exam: Vital signs in last 24 hours: Temp:  [98 F (36.7 C)-98.8 F (37.1 C)] 98 F (36.7 C) (03/22 0519) Pulse Rate:  [78-108] 90 (03/22 0519) Resp:  [18-24] 20 (03/22 0519) BP: (121-229)/(53-175) 172/53 mmHg (03/22 0519) SpO2:  [90 %-99 %] 94 % (03/22 0739) Weight:  [146 lb 6.2 oz (66.4 kg)-147 lb 14.9 oz (67.1 kg)] 146 lb 6.2 oz (66.4 kg) (03/22 0021) Weight change:     Intake/Output from previous day: 03/21 0701 - 03/22 0700 In: -  Out: 1841      Physical Exam: General- pt is awake,alert, oriented to time place and person Resp- No acute REsp distress, CTA B/L NO Rhonchi CVS- S1S2 regular in rate and rhythm GIT- BS+, soft, NT, ND EXT- NO LE Edema, Cyanosis Access -left AVF +  Lab Results: CBC  Recent Labs   09/29/14 1232 09/30/14 0345  WBC 10.4 15.4*  HGB 9.8* 11.3*  HCT 30.5* 34.9*  PLT 256 252    BMET  Recent Labs  09/29/14 1232 09/30/14 0345  NA 135 134*  K 5.5* 3.7  CL 97 93*  CO2 23 27  GLUCOSE 114* 100*  BUN 73* 23  CREATININE 12.46* 6.02*  CALCIUM 8.6 8.9    MICRO No results found for this or any previous visit (from the past 240 hour(s)).    Lab Results  Component Value Date   PTH 709.7* 05/29/2013   CALCIUM 8.9 09/30/2014   CAION 1.08* 08/24/2014   PHOS 4.6 06/01/2013               Impression: 1)Renal ESRD on HD               Pt is on MWF schedule               Pt was dialyzed yesterday               NO need of Hd today               Will dialyze in am   2)HTN  Medication-  On Calcium Channel Blockers On Alpha and beta Blockers. On Vasodilators. On [employer].  3)Anemia HGb at goal (9--11)   4)CKD Mineral-Bone Disorder PTH elevated . Secondary Hyperparathyroidism present.     On sensipar Phosphorus at goal.     On Binders Calcium at goal.  5)Resp-Admitted with cough/Bronchitis Primary MD following  6)Electrolytes Normokalemic     Was hyperkalemic NOrmonatremic   7)Acid base Co2 at goal   8) CNS- tremors / involuntary movements sec to inhalers/ benzo withdrawals    Pt to receive benzo po now  Plan:  Will dialyze in am  Will d/w primary team for need of further work up if jerks do not resolve,     Abou Sterkel S 09/30/2014, 9:01 AM

## 2014-10-01 DIAGNOSIS — E871 Hypo-osmolality and hyponatremia: Secondary | ICD-10-CM

## 2014-10-01 LAB — COMPREHENSIVE METABOLIC PANEL
ALBUMIN: 3.5 g/dL (ref 3.5–5.2)
ALT: 27 U/L (ref 0–35)
ANION GAP: 14 (ref 5–15)
AST: 33 U/L (ref 0–37)
Alkaline Phosphatase: 76 U/L (ref 39–117)
BUN: 61 mg/dL — AB (ref 6–23)
CALCIUM: 8.5 mg/dL (ref 8.4–10.5)
CO2: 25 mmol/L (ref 19–32)
CREATININE: 9.13 mg/dL — AB (ref 0.50–1.10)
Chloride: 91 mmol/L — ABNORMAL LOW (ref 96–112)
GFR calc Af Amer: 4 mL/min — ABNORMAL LOW (ref >90)
GFR, EST NON AFRICAN AMERICAN: 4 mL/min — AB (ref >90)
Glucose, Bld: 191 mg/dL — ABNORMAL HIGH (ref 70–99)
Potassium: 4.8 mmol/L (ref 3.5–5.1)
Sodium: 130 mmol/L — ABNORMAL LOW (ref 135–145)
Total Bilirubin: 0.9 mg/dL (ref 0.3–1.2)
Total Protein: 6.3 g/dL (ref 6.0–8.3)

## 2014-10-01 LAB — CBC WITH DIFFERENTIAL/PLATELET
Basophils Absolute: 0.1 10*3/uL (ref 0.0–0.1)
Basophils Relative: 1 % (ref 0–1)
Eosinophils Absolute: 0.1 10*3/uL (ref 0.0–0.7)
Eosinophils Relative: 1 % (ref 0–5)
HEMATOCRIT: 29.3 % — AB (ref 36.0–46.0)
HEMOGLOBIN: 9.5 g/dL — AB (ref 12.0–15.0)
LYMPHS PCT: 3 % — AB (ref 12–46)
Lymphs Abs: 0.3 10*3/uL — ABNORMAL LOW (ref 0.7–4.0)
MCH: 32.6 pg (ref 26.0–34.0)
MCHC: 32.4 g/dL (ref 30.0–36.0)
MCV: 100.7 fL — AB (ref 78.0–100.0)
MONOS PCT: 3 % (ref 3–12)
Monocytes Absolute: 0.4 10*3/uL (ref 0.1–1.0)
Neutro Abs: 11 10*3/uL — ABNORMAL HIGH (ref 1.7–7.7)
Neutrophils Relative %: 92 % — ABNORMAL HIGH (ref 43–77)
Platelets: 238 10*3/uL (ref 150–400)
RBC: 2.91 MIL/uL — AB (ref 3.87–5.11)
RDW: 14.3 % (ref 11.5–15.5)
WBC: 11.8 10*3/uL — ABNORMAL HIGH (ref 4.0–10.5)

## 2014-10-01 LAB — TROPONIN I
TROPONIN I: 0.07 ng/mL — AB (ref <0.031)
Troponin I: 0.1 ng/mL — ABNORMAL HIGH (ref <0.031)

## 2014-10-01 MED ORDER — ACETAMINOPHEN 325 MG PO TABS: 650.0000 mg | ORAL_TABLET | Freq: Four times a day (QID) | ORAL | Status: DC | PRN

## 2014-10-01 NOTE — Progress Notes (Addendum)
Pt transported to HD via bed with NT, [employer]

## 2014-10-01 NOTE — Progress Notes (Addendum)
[facility] PROGRESS NOTE  Shawna [name] [id number] DOB: 09/07/1940 DOA: 09/29/2014 PCP: [name], MD  Assessment/Plan: Acute Respiratory Failure -Suspect acute bronchitis plus mild component of volume overload as she missed an HD session. -Continue doxycycline. -Flu PCR negative.  HTN -Improved after HD and increased isordil dose..  Elevated troponin -Likely related to ESRD state. -No CP. -EKG unremarkable for acute changes.  Chronic Diastolic CHF -Compensated.  ESRD -On HD. -Appreciate renal following.  Hyponatremia -mild. -Resolved with HD.  Code Status: Full Code Family Communication: patient only  Disposition Plan: Home once ready, likely 24 hours   Consultants:  Renal   Antibiotics:  Doxycycline   Subjective: Still c/o "wheezing" and thick mucous production  Objective: Filed Vitals:   10/01/14 1400 10/01/14 1430 10/01/14 1450 10/01/14 1536  BP: 153/71 167/70 167/69 169/60  Pulse: 81 80 82 83  Temp:   98.1 F (36.7 C) 98.2 F (36.8 C)  TempSrc:   Oral Oral  Resp:   18 16  Height:      Weight:   66.1 kg (145 lb 11.6 oz)   SpO2:   97% 96%    Intake/Output Summary (Last 24 hours) at 10/01/14 1843 Last data filed at 10/01/14 1635  Gross per 24 hour  Intake    743 ml  Output   2000 ml  Net  -1257 ml   Filed Weights   10/01/14 0500 10/01/14 1100 10/01/14 1450  Weight: 66.2 kg (145 lb 15.1 oz) 68 kg (149 lb 14.6 oz) 66.1 kg (145 lb 11.6 oz)    Exam:   General:  AA Ox3  Cardiovascular: RRR  Respiratory: CTA B  Abdomen: S/NT/ND/+BS  Extremities: trace bilateral edema   Neurologic:  Non-focal  Data Reviewed: Basic Metabolic Panel:  Recent Labs Lab 09/29/14 1232 09/30/14 0345 09/30/14 1539 10/01/14 0300  NA 135 134*  --  130*  K 5.5* 3.7  --  4.8  CL 97 93*  --  91*  CO2 23 27  --  25  GLUCOSE 114* 100*  --  191*  BUN 73* 23  --  61*  CREATININE 12.46* 6.02*  --  9.13*  CALCIUM 8.6 8.9  --  8.5  MG   --   --  1.7  --    Liver Function Tests:  Recent Labs Lab 09/30/14 0345 10/01/14 0300  AST 32 33  ALT 26 27  ALKPHOS 101 76  BILITOT 0.9 0.9  PROT 7.2 6.3  ALBUMIN 3.9 3.5   No results for input(s): LIPASE, AMYLASE in the last 168 hours. No results for input(s): AMMONIA in the last 168 hours. CBC:  Recent Labs Lab 09/29/14 1232 09/30/14 0345 10/01/14 0300  WBC 10.4 15.4* 11.8*  NEUTROABS  --  13.6* 11.0*  HGB 9.8* 11.3* 9.5*  HCT 30.5* 34.9* 29.3*  MCV 101.3* 101.5* 100.7*  PLT 256 252 238   Cardiac Enzymes:  Recent Labs Lab 09/30/14 1008 09/30/14 1539 09/30/14 2107 10/01/14 0300 10/01/14 0849  TROPONINI 0.16* 0.12* 0.12* 0.10* 0.07*   BNP (last 3 results) No results for input(s): BNP in the last 8760 hours.  ProBNP (last 3 results) No results for input(s): PROBNP in the last 8760 hours.  CBG: No results for input(s): GLUCAP in the last 168 hours.  No results found for this or any previous visit (from the past 240 hour(s)).   Studies: No results found.  Scheduled Meds: . ALPRAZolam  0.5 mg Oral TID  . amLODipine  10 mg Oral Daily  . antiseptic oral rinse  7 mL Mouth Rinse BID  . aspirin EC  81 mg Oral Daily  . cinacalcet  30 mg Oral Q supper  . cloNIDine  0.2 mg Oral BID  . doxycycline (VIBRAMYCIN) IV  100 mg Intravenous Q12H  . fluticasone  1 spray Each Nare Daily  . heparin  5,000 Units Subcutaneous 3 times per day  . hydrALAZINE  25 mg Oral TID  . isosorbide dinitrate  30 mg Oral TID  . labetalol  200 mg Oral BID  . lanthanum  1,000 mg Oral TID PC  . methylPREDNISolone (SOLU-MEDROL) injection  60 mg Intravenous Q12H  . omeprazole  20 mg Oral Daily  . simvastatin  20 mg Oral QHS  . sodium chloride  3 mL Intravenous Q12H  . sodium chloride  3 mL Intravenous Q12H   Continuous Infusions:   Active Problems:   ESRD (end stage renal disease)   Acute respiratory failure   Dyspnea   URI (upper respiratory infection)    Hyponatremia    Time spent: 35 minutes. Greater than 50% of this time was spent in direct contact with the patient coordinating care.    Lelon Frohlich  [facility] Pager [phone]  If 7PM-7AM, please contact night-coverage at [email or web address], password [facility] 10/01/2014, 6:43 PM  LOS: 1 day

## 2014-10-01 NOTE — Progress Notes (Signed)
Discontinued patients droplet precautions, flu swab came back negative.

## 2014-10-01 NOTE — Progress Notes (Signed)
Patient has requested tylenol for mild pain and soreness r/t coughing. Paged on-call MD, will follow orders received and continue to monitor the patient.

## 2014-10-01 NOTE — Progress Notes (Signed)
Shawna [name]  MRN: [id number]  DOB/AGE: [birth year] 75 y.o.  Primary Care Physician:HOWARD, Lennette Bihari, MD  Admit date: 09/29/2014  Chief Complaint:  Chief Complaint  Patient presents with  . Cough    S-Pt presented on  09/29/2014 with  Chief Complaint  Patient presents with  . Cough  .    Pt today feels much better.   Meds . ALPRAZolam  0.5 mg Oral TID  . amLODipine  10 mg Oral Daily  . antiseptic oral rinse  7 mL Mouth Rinse BID  . aspirin EC  81 mg Oral Daily  . cinacalcet  30 mg Oral Q supper  . cloNIDine  0.2 mg Oral BID  . doxycycline (VIBRAMYCIN) IV  100 mg Intravenous Q12H  . fluticasone  1 spray Each Nare Daily  . heparin  5,000 Units Subcutaneous 3 times per day  . hydrALAZINE  25 mg Oral TID  . isosorbide dinitrate  30 mg Oral TID  . labetalol  200 mg Oral BID  . lanthanum  1,000 mg Oral TID PC  . methylPREDNISolone (SOLU-MEDROL) injection  60 mg Intravenous Q12H  . omeprazole  20 mg Oral Daily  . simvastatin  20 mg Oral QHS  . sodium chloride  3 mL Intravenous Q12H  . sodium chloride  3 mL Intravenous Q12H      Physical Exam: Vital signs in last 24 hours: Temp:  [97.4 F (36.3 C)-98.5 F (36.9 C)] 97.4 F (36.3 C) (03/23 [id number]) Pulse Rate:  [72-98] 72 (03/23 0628) Resp:  [20] 20 (03/23 0628) BP: (135-203)/(55-82) 135/55 mmHg (03/23 0628) SpO2:  [95 %-100 %] 97 % (03/23 0856) Weight:  [145 lb 15.1 oz (66.2 kg)] 145 lb 15.1 oz (66.2 kg) (03/23 0500) Weight change: -1 lb 0.9 oz (-0.479 kg) Last BM Date: 09/30/14 (per pt)  Intake/Output from previous day: 03/22 0701 - 03/23 0700 In: 77 [P.O.:480; IV Piggyback:250] Out: -  Total I/O In: 3 [I.V.:3] Out: -    Physical Exam: General- pt is awake,alert, oriented to time place and person Resp- No acute REsp distress, wheezing present CVS- S1S2 regular in rate and rhythm GIT- BS+, soft, NT, ND EXT- NO LE Edema, NO Cyanosis Access -left AVF +  Lab Results: CBC  Recent Labs  09/30/14 0345  10/01/14 0300  WBC 15.4* 11.8*  HGB 11.3* 9.5*  HCT 34.9* 29.3*  PLT 252 238    BMET  Recent Labs  09/30/14 0345 10/01/14 0300  NA 134* 130*  K 3.7 4.8  CL 93* 91*  CO2 27 25  GLUCOSE 100* 191*  BUN 23 61*  CREATININE 6.02* 9.13*  CALCIUM 8.9 8.5    MICRO No results found for this or any previous visit (from the past 240 hour(s)).    Lab Results  Component Value Date   PTH 709.7* 05/29/2013   CALCIUM 8.5 10/01/2014   CAION 1.08* 08/24/2014   PHOS 4.6 06/01/2013          Impression: 1)Renal ESRD on HD               Pt is on MWF schedule               Pt was dialyzed yesterday               NO need of Hd today               Will dialyze today  2)HTN  Medication-  On Calcium Channel Blockers On Alpha and  beta Blockers. On Vasodilators. On [employer].  3)Anemia HGb at goal (9--11)   4)CKD Mineral-Bone Disorder PTH elevated . Secondary Hyperparathyroidism present.     On sensipar Phosphorus at goal.     On Binders Calcium at goal.  5)Resp-Admitted with cough/Bronchitis Primary MD following  6)Electrolytes Normokalemic     Was hyperkalemic NOrmonatremic   7)Acid base Co2 at goal   8) CNS- tremors / involuntary movements  Now resolved   Plan:  Will dialyze today      Shawna [name] S 10/01/2014, 9:16 AM

## 2014-10-01 NOTE — Clinical Documentation Improvement (Signed)
Abnormal Lab and/or Testing Results:  Sodium: 3/23:  130.   Treatment provided: Order noted for 100 ml 0.9% NaCl x1.    Possible Clinical Conditions: >  Hyponatremia >  Other >  Not able to determine    Thank Sherian Maroon Documentation Specialist [phone] [email or web address]

## 2014-10-01 NOTE — Procedures (Signed)
   HEMODIALYSIS TREATMENT NOTE:  3.5 hour heparin-free dialysis completed via left upper arm AVF (15g ante/retrograde).  Goal met:  2 liters removed without interruption in ultrafiltration. All blood reinfused. Hemostasis achieved within 12 minutes.  Report called to Tacey Heap, RN.  Rockwell Alexandria, RN, CDN

## 2014-10-01 NOTE — Progress Notes (Signed)
Inpatient Diabetes Program Recommendations  AACE/ADA: New Consensus Statement on Inpatient Glycemic Control (2013)  Target Ranges:  Prepandial:   less than 140 mg/dL      Peak postprandial:   less than 180 mg/dL (1-2 hours)      Critically ill patients:  140 - 180 mg/dL   Results for Shawna [name], Shawna [name] (MRN [id number]) as of 10/01/2014 10:00  Ref. Range 10/01/2014 03:00  Glucose Latest Range: 70-99 mg/dL 191 (H)   Diabetes history: No Outpatient Diabetes medications: NA Current orders for Inpatient glycemic control: None  Inpatient Diabetes Program Recommendations Correction (SSI): Noted lab glucose of 191 mg/dl this morning at 3am. While inpatient and ordered steroids, please consider ordering CBGs with Novolog correction scale ACHS if needed. HgbA1C: Please consider ordering an A1C to evaluate glycemic control over the past 2-3 months.  Thanks, Barnie Alderman, RN, MSN, CCRN, CDE Diabetes Coordinator Inpatient Diabetes Program [phone] (Team Pager from [place] to [place]) [phone] ([facility] office) [phone] [facility] office)

## 2014-10-02 DIAGNOSIS — J9601 Acute respiratory failure with hypoxia: Secondary | ICD-10-CM

## 2014-10-02 LAB — COMPREHENSIVE METABOLIC PANEL
ALBUMIN: 3.3 g/dL — AB (ref 3.5–5.2)
ALT: 25 U/L (ref 0–35)
ANION GAP: 12 (ref 5–15)
AST: 30 U/L (ref 0–37)
Alkaline Phosphatase: 77 U/L (ref 39–117)
BILIRUBIN TOTAL: 0.8 mg/dL (ref 0.3–1.2)
BUN: 42 mg/dL — ABNORMAL HIGH (ref 6–23)
CALCIUM: 8.2 mg/dL — AB (ref 8.4–10.5)
CO2: 26 mmol/L (ref 19–32)
Chloride: 97 mmol/L (ref 96–112)
Creatinine, Ser: 6.09 mg/dL — ABNORMAL HIGH (ref 0.50–1.10)
GFR, EST AFRICAN AMERICAN: 7 mL/min — AB (ref >90)
GFR, EST NON AFRICAN AMERICAN: 6 mL/min — AB (ref >90)
Glucose, Bld: 210 mg/dL — ABNORMAL HIGH (ref 70–99)
Potassium: 4.5 mmol/L (ref 3.5–5.1)
SODIUM: 135 mmol/L (ref 135–145)
TOTAL PROTEIN: 6.1 g/dL (ref 6.0–8.3)

## 2014-10-02 LAB — CBC WITH DIFFERENTIAL/PLATELET
BASOS PCT: 0 % (ref 0–1)
Basophils Absolute: 0 10*3/uL (ref 0.0–0.1)
EOS PCT: 0 % (ref 0–5)
Eosinophils Absolute: 0 10*3/uL (ref 0.0–0.7)
HCT: 29.7 % — ABNORMAL LOW (ref 36.0–46.0)
Hemoglobin: 9.6 g/dL — ABNORMAL LOW (ref 12.0–15.0)
LYMPHS PCT: 4 % — AB (ref 12–46)
Lymphs Abs: 0.4 10*3/uL — ABNORMAL LOW (ref 0.7–4.0)
MCH: 33.2 pg (ref 26.0–34.0)
MCHC: 32.3 g/dL (ref 30.0–36.0)
MCV: 102.8 fL — AB (ref 78.0–100.0)
MONO ABS: 0.2 10*3/uL (ref 0.1–1.0)
Monocytes Relative: 2 % — ABNORMAL LOW (ref 3–12)
NEUTROS ABS: 8.9 10*3/uL — AB (ref 1.7–7.7)
Neutrophils Relative %: 94 % — ABNORMAL HIGH (ref 43–77)
Platelets: 228 10*3/uL (ref 150–400)
RBC: 2.89 MIL/uL — ABNORMAL LOW (ref 3.87–5.11)
RDW: 15 % (ref 11.5–15.5)
WBC: 9.5 10*3/uL (ref 4.0–10.5)

## 2014-10-02 MED ORDER — DIPHENHYDRAMINE HCL 25 MG PO CAPS
25.0000 mg | ORAL_CAPSULE | Freq: Once | ORAL | Status: AC
  Administered 2014-10-02: 25 mg via ORAL
  Filled 2014-10-02: qty 1

## 2014-10-02 MED ORDER — ISOSORBIDE DINITRATE 30 MG PO TABS: 30.0000 mg | ORAL_TABLET | Freq: Three times a day (TID) | ORAL | Status: DC

## 2014-10-02 MED ORDER — DOXYCYCLINE HYCLATE 100 MG PO TABS: 100.0000 mg | ORAL_TABLET | Freq: Two times a day (BID) | ORAL | Status: DC

## 2014-10-02 MED ORDER — ACETAMINOPHEN 325 MG PO TABS: 650.0000 mg | ORAL_TABLET | Freq: Four times a day (QID) | ORAL | Status: DC | PRN

## 2014-10-02 MED ORDER — BENZONATATE 100 MG PO CAPS: 100.0000 mg | ORAL_CAPSULE | Freq: Three times a day (TID) | ORAL | Status: DC | PRN

## 2014-10-02 MED ORDER — FLUCONAZOLE 150 MG PO TABS: 150.0000 mg | ORAL_TABLET | Freq: Once | ORAL | Status: DC

## 2014-10-02 MED ORDER — POLYETHYLENE GLYCOL 3350 17 G PO PACK: 17.0000 g | PACK | Freq: Every day | ORAL | Status: DC

## 2014-10-02 NOTE — Progress Notes (Signed)
Reviewed discharge instructions and prescriptions with patient and family.  IV and telemetry removed, pt tolerated well.  Answered all patient questions.  Pt discharging home with husband.

## 2014-10-02 NOTE — Discharge Summary (Signed)
Physician Discharge Summary  Shawna [name] [id number] DOB: 11/18/1940 DOA: 09/29/2014  PCP: [name], MD  Admit date: 09/29/2014 Discharge date: 10/02/2014  Time spent: 45 minutes  Recommendations for Outpatient Follow-up:  -Will be discharged home today. -Advised to follow up with her PCP in 2 weeks.   Discharge Diagnoses:  Active Problems:   ESRD (end stage renal disease)   Acute respiratory failure   Dyspnea   URI (upper respiratory infection)   Hyponatremia   Discharge Condition: Stable and improved  Filed Weights   10/01/14 1100 10/01/14 1450 10/02/14 0541  Weight: 68 kg (149 lb 14.6 oz) 66.1 kg (145 lb 11.6 oz) 67.087 kg (147 lb 14.4 oz)    History of present illness:  This is a 74 y.o. year old female with significant past medical history of ESRD on HD , CAD, diastolic CHF, chronic bronchitis presenting with acute resp failure, volume overload. Per report, pt with worsening cough over past 1-2 days at home. + subjective chills at home, wheezing, SOB. Missed HD yesterday because of sxs. Was seen in ER for sxs 3/21 x 2 [facility] [place], Lacinda Axon @ [id number]), was given Rx for mucinex and subsquently sent home from am ED visit. Came back to ER later in day because of persistence of sxs. Noted mild hyperkalemia @ 5.5. Renal consulted for HD. CXR w/ bibasilar atelectasis. Noted trop 0.04. EKG NSR-LVH. No reports of CP. CXR negative for acute infiltrate though w/ bibasilar atelectasis.   Hospital Course:   Acute Respiratory Failure -Suspect acute bronchitis plus mild component of volume overload as she missed an HD session. -Continue doxycycline for 5 days upon DC. -Flu PCR negative.  HTN -Improved after HD and increased isordil dose..  Elevated troponin -Likely related to ESRD state. -No CP. -EKG unremarkable for acute changes.  Chronic Diastolic CHF -Compensated.  ESRD -On HD. -Appreciate renal following.  Hyponatremia -mild. -Resolved with  HD.  Procedures:  HD   Consultations:  Renal  Discharge Instructions  Discharge Instructions    Diet - low sodium heart healthy    Complete by:  As directed      Increase activity slowly    Complete by:  As directed             Medication List    STOP taking these medications        guaifenesin 100 MG/5ML syrup  Commonly known as:  ROBITUSSIN     guaiFENesin 600 MG 12 hr tablet  Commonly known as:  MUCINEX      TAKE these medications        acetaminophen 325 MG tablet  Commonly known as:  TYLENOL  Take 2 tablets (650 mg total) by mouth every 6 (six) hours as needed for mild pain (mild pain).     ALPRAZolam 0.5 MG tablet  Commonly known as:  XANAX  Take 0.5 mg by mouth 3 (three) times daily.     amLODipine 10 MG tablet  Commonly known as:  NORVASC  Take 1 tablet (10 mg total) by mouth daily.     aspirin EC 81 MG tablet  Take 81 mg by mouth daily.     benzonatate 100 MG capsule  Commonly known as:  TESSALON PERLES  Take 1 capsule (100 mg total) by mouth 3 (three) times daily as needed for cough.     cloNIDine 0.2 MG tablet  Commonly known as:  CATAPRES  Take 1 tablet (0.2 mg total) by mouth 2 (two)  times daily.     dextromethorphan-guaiFENesin 30-600 MG per 12 hr tablet  Commonly known as:  MUCINEX DM  Take 1 tablet by mouth daily.     doxycycline 100 MG tablet  Commonly known as:  VIBRA-TABS  Take 1 tablet (100 mg total) by mouth 2 (two) times daily.  Notes to Patient:  NEW PRESCRIPTION, take as prescribed     fluconazole 150 MG tablet  Commonly known as:  DIFLUCAN  Take 1 tablet (150 mg total) by mouth once.     fluticasone 50 MCG/ACT nasal spray  Commonly known as:  FLONASE  Place 2 sprays into the nose daily as needed for allergies.     folic acid-vitamin b complex-vitamin c-selenium-zinc 3 MG Tabs tablet  Take 1 tablet by mouth daily.     hydrALAZINE 25 MG tablet  Commonly known as:  APRESOLINE  Take 1 tablet (25 mg total) by mouth 3  (three) times daily.     isosorbide dinitrate 30 MG tablet  Commonly known as:  ISORDIL  Take 1 tablet (30 mg total) by mouth 3 (three) times daily.     labetalol 200 MG tablet  Commonly known as:  NORMODYNE  Take 200 mg by mouth 2 (two) times daily.     lanthanum 1000 MG chewable tablet  Commonly known as:  FOSRENOL  Chew 1,000 mg by mouth 3 (three) times daily after meals.     loratadine 10 MG tablet  Commonly known as:  CLARITIN  Take 10 mg by mouth daily.     nitroGLYCERIN 0.4 MG SL tablet  Commonly known as:  NITROSTAT  Place 1 tablet (0.4 mg total) under the tongue every 5 (five) minutes x 3 doses as needed for chest pain.     omeprazole 20 MG capsule  Commonly known as:  PRILOSEC  Take 1 capsule (20 mg total) by mouth daily.     polyethylene glycol packet  Commonly known as:  MIRALAX / GLYCOLAX  Take 17 g by mouth daily.     SENSIPAR 30 MG tablet  Generic drug:  cinacalcet  Take 30 mg by mouth daily with supper.     simvastatin 20 MG tablet  Commonly known as:  ZOCOR  Take 1 tablet by mouth at bedtime.     tetrahydrozoline 0.05 % ophthalmic solution  Place 1 drop into both eyes 2 (two) times daily as needed (irritation).     zolpidem 10 MG tablet  Commonly known as:  AMBIEN  Take 10 mg by mouth at bedtime as needed for sleep.       Allergies  Allergen Reactions  . Aspirin Other (See Comments)    Mess up her stomach; "makes my bowels have blood in them". Takes 81 mg EC Aspirin   . Contrast Media [Iodinated Diagnostic Agents] Itching  . Iron Itching and Other (See Comments)    "they gave me iron in dialysis; had to give me Benadryl cause I had to have the iron" (05/02/2012)  . Macrodantin [Nitrofurantoin Macrocrystal] Other (See Comments)    "broke me out in big old knots all over my body; had to go to ER"  . Penicillins Other (See Comments)    "makes me real weak when I take it; like I'll pass out"  . Plavix [Clopidogrel Bisulfate] Rash  . Bactrim  [Sulfamethoxazole-Trimethoprim] Rash  . Sulfa Antibiotics Rash  . Venofer [Ferric Oxide] Itching    Patient reports using Benadryl prior to doses as [facility]  .  Dexilant [Dexlansoprazole] Other (See Comments)    Upset stomach  . Morphine And Related     Itching in feet  . Prilosec [Omeprazole] Other (See Comments)    "back spasms"  . Levaquin [Levofloxacin In D5w] Rash  . Protonix [Pantoprazole Sodium] Rash       Follow-up Information    Follow up with [name], MD. Schedule an appointment as soon as possible for a visit on 10/13/2014.   Specialty:  Family Medicine   Why:  FOLLOW-UP APPT WITH DR.HOWARD ON APRIL 4,2016 AT 09:00AM   Contact information:   [place] [address] [phone]        The results of significant diagnostics from this hospitalization (including imaging, microbiology, ancillary and laboratory) are listed below for reference.    Significant Diagnostic Studies: Dg Chest 2 View  09/29/2014   CLINICAL DATA:  Productive cough, congestion and upper abdominal pain for 2 days  EXAM: CHEST  2 VIEW  COMPARISON:  02/14/2014  FINDINGS: There is moderate pulmonary hyperinflation. There is mild cardiomegaly. There is no acute airspace opacity. There are mild basilar linear scar is bilaterally. There are no effusions.  IMPRESSION: Hyperinflation and cardiomegaly.  No acute cardiopulmonary findings.   Electronically Signed   By: Andreas Newport M.D.   On: 09/29/2014 02:46   Dg Chest Port 1 View  09/29/2014   CLINICAL DATA:  Productive cough.  EXAM: PORTABLE CHEST - 1 VIEW  COMPARISON:  Film earlier today at 0226 hours  FINDINGS: The heart size is stable and within normal limits. The patient has had prior CABG. Mild bibasilar atelectasis present. There is no evidence of pulmonary edema, consolidation, pneumothorax, nodule or pleural fluid.  IMPRESSION: Bibasilar atelectasis.  No active disease.   Electronically Signed   By: Aletta Edouard M.D.   On:  09/29/2014 16:47    Microbiology: No results found for this or any previous visit (from the past 240 hour(s)).   Labs: Basic Metabolic Panel:  Recent Labs Lab 09/29/14 1232 09/30/14 0345 09/30/14 1539 10/01/14 0300 10/02/14 0613  NA 135 134*  --  130* 135  K 5.5* 3.7  --  4.8 4.5  CL 97 93*  --  91* 97  CO2 23 27  --  25 26  GLUCOSE 114* 100*  --  191* 210*  BUN 73* 23  --  61* 42*  CREATININE 12.46* 6.02*  --  9.13* 6.09*  CALCIUM 8.6 8.9  --  8.5 8.2*  MG  --   --  1.7  --   --    Liver Function Tests:  Recent Labs Lab 09/30/14 0345 10/01/14 0300 10/02/14 0613  AST 32 33 30  ALT 26 27 25   ALKPHOS 101 76 77  BILITOT 0.9 0.9 0.8  PROT 7.2 6.3 6.1  ALBUMIN 3.9 3.5 3.3*   No results for input(s): LIPASE, AMYLASE in the last 168 hours. No results for input(s): AMMONIA in the last 168 hours. CBC:  Recent Labs Lab 09/29/14 1232 09/30/14 0345 10/01/14 0300 10/02/14 0613  WBC 10.4 15.4* 11.8* 9.5  NEUTROABS  --  13.6* 11.0* 8.9*  HGB 9.8* 11.3* 9.5* 9.6*  HCT 30.5* 34.9* 29.3* 29.7*  MCV 101.3* 101.5* 100.7* 102.8*  PLT 256 252 238 228   Cardiac Enzymes:  Recent Labs Lab 09/30/14 1008 09/30/14 1539 09/30/14 2107 10/01/14 0300 10/01/14 0849  TROPONINI 0.16* 0.12* 0.12* 0.10* 0.07*   BNP: BNP (last 3 results) No results for input(s): BNP in  the last 8760 hours.  ProBNP (last 3 results) No results for input(s): PROBNP in the last 8760 hours.  CBG: No results for input(s): GLUCAP in the last 168 hours.     SignedLelon Frohlich  [facility] Pager: [phone] 10/02/2014, 3:28 PM

## 2014-10-02 NOTE — Progress Notes (Signed)
Pt is complaining of itching. I have paged Dr.Newton who gave an order for a one time dose of benadryl. I will administer and continue to monitor.

## 2014-10-02 NOTE — Progress Notes (Signed)
Subjective: Interval History: has no complaint of difficulty breathing. Patient states that she is feeling much better. Still denies drinking loss of fluid..  Objective: Vital signs in last 24 hours: Temp:  [98.1 F (36.7 C)-98.9 F (37.2 C)] 98.5 F (36.9 C) (03/24 0541) Pulse Rate:  [69-92] 76 (03/24 0541) Resp:  [16-18] 16 (03/24 0541) BP: (131-169)/(51-84) 140/62 mmHg (03/24 0541) SpO2:  [93 %-100 %] 93 % (03/24 0705) Weight:  [66.1 kg (145 lb 11.6 oz)-68 kg (149 lb 14.6 oz)] 67.087 kg (147 lb 14.4 oz) (03/24 0541) Weight change: 1.8 kg (3 lb 15.5 oz)  Intake/Output from previous day: 03/23 0701 - 03/24 0700 In: 993 [P.O.:240; I.V.:3; IV Piggyback:750] Out: 2000  Intake/Output this shift: Total I/O In: 240 [P.O.:240] Out: -   General appearance: alert, cooperative and no distress Resp: clear to auscultation bilaterally Cardio: regular rate and rhythm, S1, S2 normal, no murmur, click, rub or gallop GI: soft, non-tender; bowel sounds normal; no masses,  no organomegaly Extremities: extremities normal, atraumatic, no cyanosis or edema  Lab Results:  Recent Labs  10/01/14 0300 10/02/14 0613  WBC 11.8* 9.5  HGB 9.5* 9.6*  HCT 29.3* 29.7*  PLT 238 228   BMET:  Recent Labs  10/01/14 0300 10/02/14 0613  NA 130* 135  K 4.8 4.5  CL 91* 97  CO2 25 26  GLUCOSE 191* 210*  BUN 61* 42*  CREATININE 9.13* 6.09*  CALCIUM 8.5 8.2*   No results for input(s): PTH in the last 72 hours. Iron Studies: No results for input(s): IRON, TIBC, TRANSFERRIN, FERRITIN in the last 72 hours.  Studies/Results: No results found.  I have reviewed the patient's current medications.  Assessment/Plan: Problem #1 difficulty breathing: Presently is better. Possibly accomplished of CHF and upper respiratory tract infection. Patient on antibiotics and status post fluid removal on dialysis Problem #2 end-stage renal disease she is status post hemodialysis yesterday and no uremic signs and  symptoms Problem #3 anemia: Her hemoglobin is below target: She is on Epogen Problem #4 hypertension her blood pressure seems to be reasonably controlled  Problem #5 coronary artery disease Problem #6 metabolic bone disease: Patient on a binder. Plan: We'll make arrangements for patient to get dialysis tomorrow if she remains in the hospital. Otherwise patient will go to her outpatient regular dialysis unit.   LOS: 2 days   Stirling Orton S 10/02/2014,9:47 AM

## 2014-10-16 ENCOUNTER — Emergency Department (HOSPITAL_COMMUNITY): Payer: Medicare Other

## 2014-10-16 ENCOUNTER — Telehealth: Payer: Self-pay | Admitting: Cardiovascular Disease

## 2014-10-16 ENCOUNTER — Encounter (HOSPITAL_COMMUNITY): Payer: Self-pay | Admitting: *Deleted

## 2014-10-16 ENCOUNTER — Emergency Department (HOSPITAL_COMMUNITY)
Admission: EM | Admit: 2014-10-16 | Discharge: 2014-10-17 | Disposition: A | Discharge status: Home / Self Care | Payer: Medicare Other | Attending: Emergency Medicine | Admitting: Emergency Medicine

## 2014-10-16 DIAGNOSIS — Z7952 Long term (current) use of systemic steroids: Secondary | ICD-10-CM | POA: Insufficient documentation

## 2014-10-16 DIAGNOSIS — Z8701 Personal history of pneumonia (recurrent): Secondary | ICD-10-CM | POA: Insufficient documentation

## 2014-10-16 DIAGNOSIS — Z992 Dependence on renal dialysis: Secondary | ICD-10-CM | POA: Insufficient documentation

## 2014-10-16 DIAGNOSIS — Z79899 Other long term (current) drug therapy: Secondary | ICD-10-CM | POA: Insufficient documentation

## 2014-10-16 DIAGNOSIS — I2510 Atherosclerotic heart disease of native coronary artery without angina pectoris: Secondary | ICD-10-CM | POA: Diagnosis not present

## 2014-10-16 DIAGNOSIS — Z8673 Personal history of transient ischemic attack (TIA), and cerebral infarction without residual deficits: Secondary | ICD-10-CM | POA: Diagnosis not present

## 2014-10-16 DIAGNOSIS — I5032 Chronic diastolic (congestive) heart failure: Secondary | ICD-10-CM | POA: Insufficient documentation

## 2014-10-16 DIAGNOSIS — Z880 Allergy status to penicillin: Secondary | ICD-10-CM | POA: Diagnosis not present

## 2014-10-16 DIAGNOSIS — Z7982 Long term (current) use of aspirin: Secondary | ICD-10-CM | POA: Diagnosis not present

## 2014-10-16 DIAGNOSIS — Z8543 Personal history of malignant neoplasm of ovary: Secondary | ICD-10-CM | POA: Diagnosis not present

## 2014-10-16 DIAGNOSIS — I120 Hypertensive chronic kidney disease with stage 5 chronic kidney disease or end stage renal disease: Secondary | ICD-10-CM | POA: Insufficient documentation

## 2014-10-16 DIAGNOSIS — Z951 Presence of aortocoronary bypass graft: Secondary | ICD-10-CM | POA: Diagnosis not present

## 2014-10-16 DIAGNOSIS — I252 Old myocardial infarction: Secondary | ICD-10-CM | POA: Insufficient documentation

## 2014-10-16 DIAGNOSIS — Q2733 Arteriovenous malformation of digestive system vessel: Secondary | ICD-10-CM | POA: Diagnosis not present

## 2014-10-16 DIAGNOSIS — K219 Gastro-esophageal reflux disease without esophagitis: Secondary | ICD-10-CM | POA: Insufficient documentation

## 2014-10-16 DIAGNOSIS — R05 Cough: Secondary | ICD-10-CM | POA: Insufficient documentation

## 2014-10-16 DIAGNOSIS — N186 End stage renal disease: Secondary | ICD-10-CM | POA: Insufficient documentation

## 2014-10-16 DIAGNOSIS — Z85038 Personal history of other malignant neoplasm of large intestine: Secondary | ICD-10-CM | POA: Insufficient documentation

## 2014-10-16 DIAGNOSIS — M1990 Unspecified osteoarthritis, unspecified site: Secondary | ICD-10-CM | POA: Insufficient documentation

## 2014-10-16 DIAGNOSIS — R002 Palpitations: Secondary | ICD-10-CM

## 2014-10-16 DIAGNOSIS — I10 Essential (primary) hypertension: Secondary | ICD-10-CM

## 2014-10-16 DIAGNOSIS — Z9861 Coronary angioplasty status: Secondary | ICD-10-CM | POA: Insufficient documentation

## 2014-10-16 DIAGNOSIS — R059 Cough, unspecified: Secondary | ICD-10-CM

## 2014-10-16 DIAGNOSIS — Z792 Long term (current) use of antibiotics: Secondary | ICD-10-CM | POA: Diagnosis not present

## 2014-10-16 DIAGNOSIS — D649 Anemia, unspecified: Secondary | ICD-10-CM | POA: Insufficient documentation

## 2014-10-16 DIAGNOSIS — D539 Nutritional anemia, unspecified: Secondary | ICD-10-CM

## 2014-10-16 LAB — CBC WITH DIFFERENTIAL/PLATELET
BASOS ABS: 0.1 10*3/uL (ref 0.0–0.1)
BASOS PCT: 1 % (ref 0–1)
EOS ABS: 0.5 10*3/uL (ref 0.0–0.7)
Eosinophils Relative: 6 % — ABNORMAL HIGH (ref 0–5)
HCT: 30.9 % — ABNORMAL LOW (ref 36.0–46.0)
Hemoglobin: 9.9 g/dL — ABNORMAL LOW (ref 12.0–15.0)
Lymphocytes Relative: 13 % (ref 12–46)
Lymphs Abs: 1.1 10*3/uL (ref 0.7–4.0)
MCH: 32.8 pg (ref 26.0–34.0)
MCHC: 32 g/dL (ref 30.0–36.0)
MCV: 102.3 fL — ABNORMAL HIGH (ref 78.0–100.0)
Monocytes Absolute: 0.4 10*3/uL (ref 0.1–1.0)
Monocytes Relative: 5 % (ref 3–12)
NEUTROS ABS: 6.2 10*3/uL (ref 1.7–7.7)
NEUTROS PCT: 75 % (ref 43–77)
PLATELETS: 269 10*3/uL (ref 150–400)
RBC: 3.02 MIL/uL — ABNORMAL LOW (ref 3.87–5.11)
RDW: 15.6 % — AB (ref 11.5–15.5)
WBC: 8.2 10*3/uL (ref 4.0–10.5)

## 2014-10-16 LAB — COMPREHENSIVE METABOLIC PANEL
ALBUMIN: 3.8 g/dL (ref 3.5–5.2)
ALT: 14 U/L (ref 0–35)
AST: 14 U/L (ref 0–37)
Alkaline Phosphatase: 107 U/L (ref 39–117)
Anion gap: 13 (ref 5–15)
BILIRUBIN TOTAL: 0.5 mg/dL (ref 0.3–1.2)
BUN: 42 mg/dL — ABNORMAL HIGH (ref 6–23)
CO2: 27 mmol/L (ref 19–32)
Calcium: 9 mg/dL (ref 8.4–10.5)
Chloride: 96 mmol/L (ref 96–112)
Creatinine, Ser: 8.92 mg/dL — ABNORMAL HIGH (ref 0.50–1.10)
GFR calc Af Amer: 4 mL/min — ABNORMAL LOW (ref >90)
GFR calc non Af Amer: 4 mL/min — ABNORMAL LOW (ref >90)
GLUCOSE: 125 mg/dL — AB (ref 70–99)
POTASSIUM: 3.9 mmol/L (ref 3.5–5.1)
SODIUM: 136 mmol/L (ref 135–145)
Total Protein: 6.8 g/dL (ref 6.0–8.3)

## 2014-10-16 IMAGING — DX DG CHEST 2V
2 series · 2 of 2 positions shown · non-contrast
Comparison: [DATE]

CLINICAL DATA: Elevated blood pressure and rapid heart rate for 1
day.

EXAM:
CHEST  2 VIEW

[chest pa]
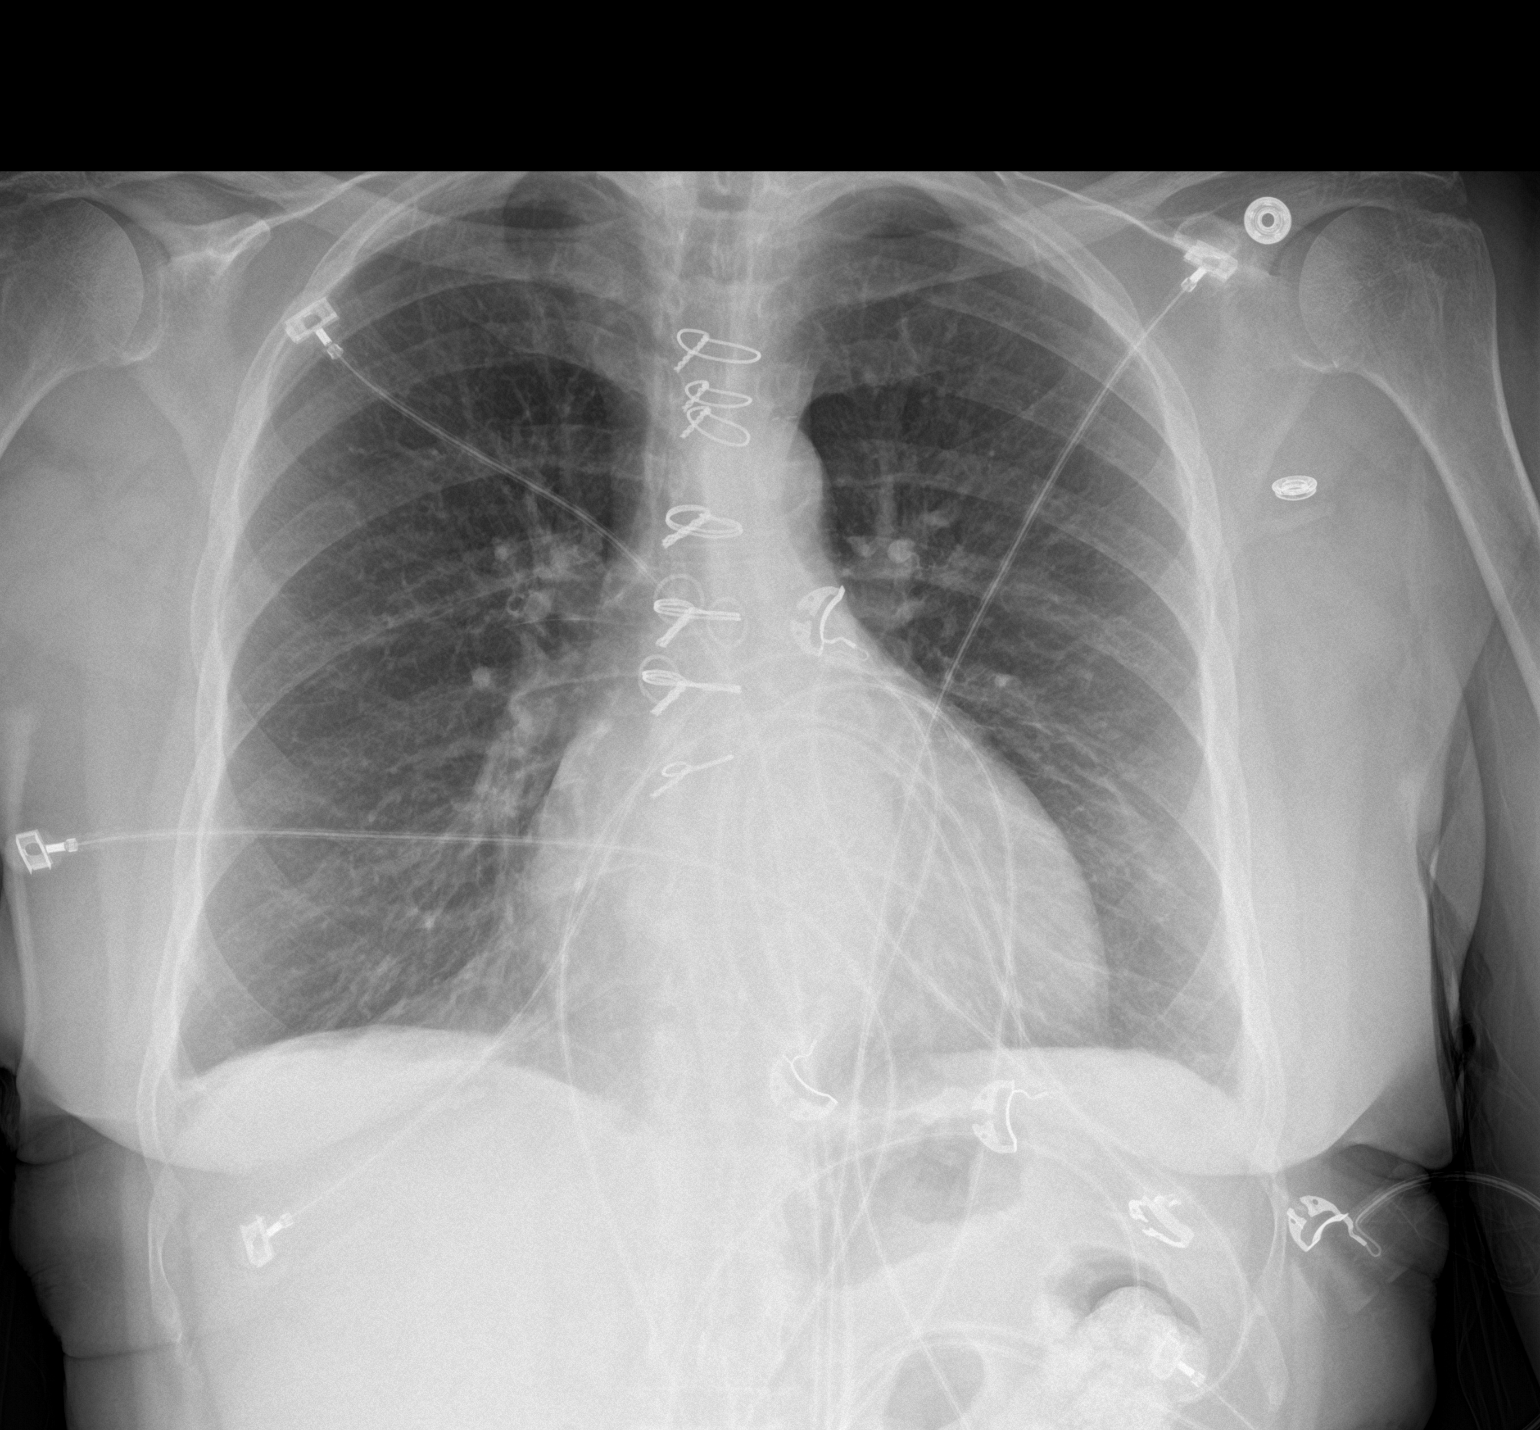

[chest lat]
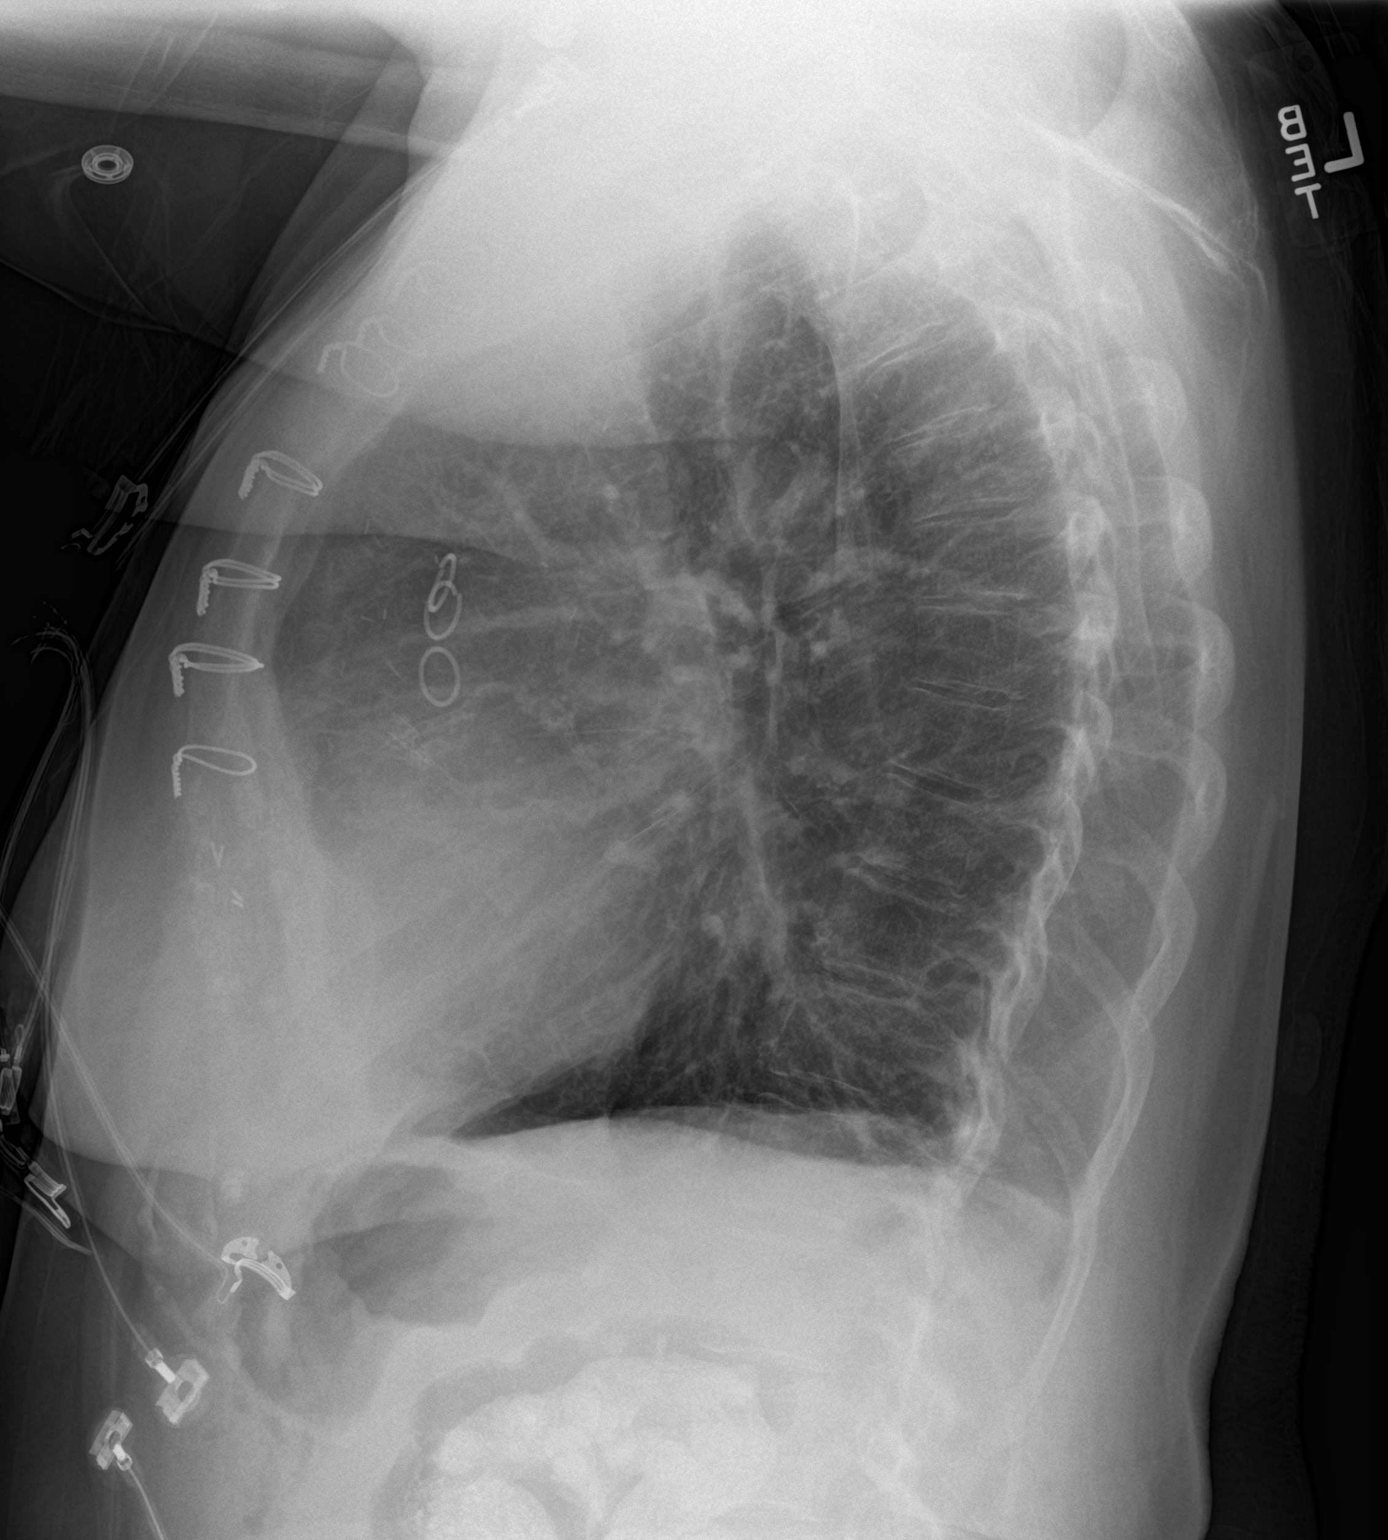

[2 of 2 positions shown; findings below may reference images not displayed]

FINDINGS: Postoperative changes in the mediastinum. Borderline heart size
without vascular congestion. No focal airspace disease or
consolidation in the lungs. Linear atelectasis in the right lung
base. No blunting of costophrenic angles. No pneumothorax. Calcified
and tortuous aorta.
IMPRESSION: No evidence of active pulmonary disease. Atelectasis in the right
lung base.

## 2014-10-16 NOTE — ED Notes (Signed)
Pt c/o high blood pressure and tachycardia at dialysis yesterday. Pt also c/o nausea and fever.

## 2014-10-16 NOTE — Telephone Encounter (Signed)
Left message to return call 

## 2014-10-16 NOTE — Telephone Encounter (Signed)
Shawna [name] called stating that yesterday while at Dialysis her heart started racing. States that it continues to race today.  Please call patient at home.

## 2014-10-16 NOTE — ED Provider Notes (Signed)
CSN: [id number]     Arrival date & time 10/16/14  2110 History  This chart was scribed for Delora Fuel, MD by Randa Evens, ED Scribe. This patient was seen in room APA09/APA09 and the patient's care was started at 11:05 PM.     Chief Complaint  Patient presents with  . Hypertension   Patient is a 74 y.o. female presenting with hypertension. The history is provided by the patient. No language interpreter was used.  Hypertension Pertinent negatives include no chest pain.   HPI Comments: Shawna [name] is a 74 y.o. female who presents to the Emergency Department complaining of hypertension onset tonight. Pt states that she noticed her BP was elevated tonight at home while at rest. Pt reports having a systolic pressure greater than 200 PTA. Pt report having intermittent heart palpitations onset 1 day ago. Pt states that her symptoms began while at dialysis yesterday. Pt states that the fastest she noticed her heart beat was 126 BPM yesterday. Pt states that when she is tachycardic she begins to fill nauseous. Pt also report productive cough that is improving with associated chills. Pt denies CP, chest tightness or SOB.   Past Medical History  Diagnosis Date  . Chronic bronchitis   . GERD (gastroesophageal reflux disease)   . PUD (peptic ulcer disease)   . History of lower GI bleeding   . Arthritis   . History of gout   . CAD (coronary artery disease)     a. 12/2011 NSTEMI/Cath/PCI LCX (2.25x14 Resolute DES) & D1 (2.25x22 Resolute DES);  b. 01/2012 Cath/PCI: LM 30, LAD 30p, 40-81m, D1 stent ok, 99 in sm branch of diag, LCX patent stent, OM1 20, RCA 95 ost (4.0x12 Promus DES), EF 55%;  c. 04/2012 Lexi Cardiolite  EF 48%, small area of scar @ base/mid inflat wall with mild peri-infarct ischemia.; CABG 12/4  . High cholesterol 12/2011  . Pneumonia ~ 2009  . Iron deficiency anemia   . TIA (transient ischemic attack)   . Anxiety   . History of blood transfusion 07/2011; 12/2011; 01/2012 X 2; 04/2012   . Carotid artery disease     a. [id number] LICA, [id number]   . Mitral regurgitation     a. Moderate by echo, 02/2012  . Myocardial infarction   . Chronic diastolic CHF (congestive heart failure)     a. 02/2012 Echo EF 60-65%, nl wall motion, Gr 1 DD, mod MR  . Hypertension   . AVM (arteriovenous malformation) of colon   . Esophageal stricture   . Ovarian cancer 1992  . Colon cancer 1992  . ESRD on hemodialysis     ESRD due to HTN, started dialysis 2011 and gets HD at [facility] with Dr Hinda Lenis on MWF schedule.  Access is LUA AVF as of Sept 2014.    Past Surgical History  Procedure Laterality Date  . Abdominal hysterectomy  1992  . Appendectomy  06/1990  . Tubal ligation  1980's  . Av fistula placement  07/2009    left upper arm  . Thrombectomy / arteriovenous graft revision  2011    left upper arm  . Colon resection  1992  . Esophagogastroduodenoscopy  01/20/2012    Procedure: ESOPHAGOGASTRODUODENOSCOPY (EGD);  Surgeon: Ladene Artist, MD,FACG;  Location: [facility] ENDOSCOPY;  Service: Endoscopy;  Laterality: N/A;  . Dilation and curettage of uterus    . Coronary angioplasty with stent placement  12/15/11    "2"  . Coronary angioplasty with stent placement  y/2013    "  1; makes total of 3" (05/02/2012)  . Coronary artery bypass graft  06/13/2012    Procedure: CORONARY ARTERY BYPASS GRAFTING (CABG);  Surgeon: Grace Isaac, MD;  Location: [place];  Service: Open Heart Surgery;  Laterality: N/A;  cabg x four;  using left internal mammary artery, and left leg greater saphenous vein harvested endoscopically  . Intraoperative transesophageal echocardiogram  06/13/2012    Procedure: INTRAOPERATIVE TRANSESOPHAGEAL ECHOCARDIOGRAM;  Surgeon: Grace Isaac, MD;  Location: [place];  Service: Open Heart Surgery;  Laterality: N/A;  . Esophagogastroduodenoscopy N/A 03/26/2013    Procedure: ESOPHAGOGASTRODUODENOSCOPY (EGD);  Surgeon: Irene Shipper, MD;  Location: [facility] ENDOSCOPY;  Service: Endoscopy;  Laterality:  N/A;  . Ovary surgery      ovarian cancer  . Cardiac surgery    . Left heart catheterization with coronary angiogram N/A 12/15/2011    Procedure: LEFT HEART CATHETERIZATION WITH CORONARY ANGIOGRAM;  Surgeon: Burnell Blanks, MD;  Location: [facility] CATH LAB;  Service: Cardiovascular;  Laterality: N/A;  . Left heart catheterization with coronary angiogram N/A 01/10/2012    Procedure: LEFT HEART CATHETERIZATION WITH CORONARY ANGIOGRAM;  Surgeon: Peter M Martinique, MD;  Location: [facility] CATH LAB;  Service: Cardiovascular;  Laterality: N/A;  . Left heart catheterization with coronary angiogram N/A 06/08/2012    Procedure: LEFT HEART CATHETERIZATION WITH CORONARY ANGIOGRAM;  Surgeon: Burnell Blanks, MD;  Location: [facility] CATH LAB;  Service: Cardiovascular;  Laterality: N/A;  . Shuntogram N/A 10/15/2013    Procedure: Fistulogram;  Surgeon: Serafina Mitchell, MD;  Location[facility] CATH LAB;  Service: Cardiovascular;  Laterality: N/A;  . Left heart catheterization with coronary/graft angiogram N/A 12/10/2013    Procedure: LEFT HEART CATHETERIZATION WITH Beatrix Fetters;  Surgeon: [name], MD;  Location: [facility] CATH LAB;  Service: Cardiovascular;  Laterality: N/A;   Family History  Problem Relation Age of Onset  . Other      noncontributory for early CAD  . Heart disease Mother     Heart Disease before age 61  . Hyperlipidemia Mother   . Hypertension Mother   . Diabetes Mother   . Heart attack Mother   . Heart disease Father     Heart Disease before age 49  . Hyperlipidemia Father   . Hypertension Father   . Diabetes Father   . Diabetes Sister   . Hypertension Sister   . Diabetes Brother   . Hyperlipidemia Brother   . Heart attack Brother   . Colon cancer Neg Hx   . Esophageal cancer Neg Hx   . Liver disease Neg Hx   . Kidney disease Neg Hx   . Colon polyps Neg Hx   . Hypertension Sister   . Heart attack Brother    History  Substance Use Topics  . Smoking status: Never Smoker    . Smokeless tobacco: Never Used  . Alcohol Use: No   OB History    No data available     Review of Systems  Constitutional: Positive for chills. Negative for fever.  Respiratory: Negative for chest tightness.   Cardiovascular: Positive for palpitations. Negative for chest pain.  Gastrointestinal: Positive for nausea.  All other systems reviewed and are negative.     Allergies  Aspirin; Contrast media; Iron; Macrodantin; Penicillins; Plavix; Bactrim; Sulfa antibiotics; Venofer; Dexilant; Morphine and related; Prilosec; Levaquin; and Protonix  Home Medications   Prior to Admission medications   Medication Sig Start Date End Date Taking? Authorizing Provider  acetaminophen (TYLENOL) 325 MG tablet  Take 2 tablets (650 mg total) by mouth every 6 (six) hours as needed for mild pain (mild pain). 10/02/14   Erline Hau, MD  ALPRAZolam Duanne Moron) 0.5 MG tablet Take 0.5 mg by mouth 3 (three) times daily.    Historical Provider, MD  amLODipine (NORVASC) 10 MG tablet Take 1 tablet (10 mg total) by mouth daily. 12/17/13   Lendon Colonel, NP  aspirin EC 81 MG tablet Take 81 mg by mouth daily.    Historical Provider, MD  benzonatate (TESSALON PERLES) 100 MG capsule Take 1 capsule (100 mg total) by mouth 3 (three) times daily as needed for cough. 10/02/14   Erline Hau, MD  cloNIDine (CATAPRES) 0.2 MG tablet Take 1 tablet (0.2 mg total) by mouth 2 (two) times daily. 12/23/13   Fay Records, MD  dextromethorphan-guaiFENesin [facility] DM) 30-600 MG per 12 hr tablet Take 1 tablet by mouth daily. 09/29/14   Fredia Sorrow, MD  doxycycline (VIBRA-TABS) 100 MG tablet Take 1 tablet (100 mg total) by mouth 2 (two) times daily. 10/02/14   Erline Hau, MD  fluconazole (DIFLUCAN) 150 MG tablet Take 1 tablet (150 mg total) by mouth once. 10/02/14   Erline Hau, MD  fluticasone (FLONASE) 50 MCG/ACT nasal spray Place 2 sprays into the nose daily as needed for  allergies.     Historical Provider, MD  folic acid-vitamin b complex-vitamin c-selenium-zinc (DIALYVITE) 3 MG TABS Take 1 tablet by mouth daily.    Historical Provider, MD  hydrALAZINE (APRESOLINE) 25 MG tablet Take 1 tablet (25 mg total) by mouth 3 (three) times daily. 09/16/14   Herminio Commons, MD  isosorbide dinitrate (ISORDIL) 30 MG tablet Take 1 tablet (30 mg total) by mouth 3 (three) times daily. 10/02/14   Erline Hau, MD  labetalol (NORMODYNE) 200 MG tablet Take 200 mg by mouth 2 (two) times daily.    Historical Provider, MD  lanthanum (FOSRENOL) 1000 MG chewable tablet Chew 1,000 mg by mouth 3 (three) times daily after meals.    Historical Provider, MD  loratadine (CLARITIN) 10 MG tablet Take 10 mg by mouth daily.     Historical Provider, MD  nitroGLYCERIN (NITROSTAT) 0.4 MG SL tablet Place 1 tablet (0.4 mg total) under the tongue every 5 (five) minutes x 3 doses as needed for chest pain. 06/23/14   Herminio Commons, MD  omeprazole (PRILOSEC) 20 MG capsule Take 1 capsule (20 mg total) by mouth daily. 02/10/14   Ladene Artist, MD  polyethylene glycol [facility] / Floria Raveling) packet Take 17 g by mouth daily. 10/02/14   Erline Hau, MD  SENSIPAR 30 MG tablet Take 30 mg by mouth daily with supper.  12/21/12   Historical Provider, MD  simvastatin (ZOCOR) 20 MG tablet Take 1 tablet by mouth at bedtime.  03/13/14   Historical Provider, MD  tetrahydrozoline 0.05 % ophthalmic solution Place 1 drop into both eyes 2 (two) times daily as needed (irritation).    Historical Provider, MD  zolpidem (AMBIEN) 10 MG tablet Take 10 mg by mouth at bedtime as needed for sleep.  05/27/13   Historical Provider, MD   BP 193/80 mmHg  Pulse 83  Temp(Src) 99.2 F (37.3 C) (Oral)  Resp 20  Ht 5\' 1"  (1.549 m)  Wt 147 lb (66.679 kg)  BMI 27.79 kg/m2  SpO2 98%   Physical Exam  Constitutional: She is oriented to person, place, and time. She appears well-developed  and well-nourished. No  distress.  HENT:  Head: Normocephalic and atraumatic.  Eyes: Conjunctivae and EOM are normal. Pupils are equal, round, and reactive to light.  Neck: Normal range of motion. Neck supple. No JVD present.  Cardiovascular: Normal rate, regular rhythm and normal heart sounds.   No murmur heard. Pulmonary/Chest: Effort normal and breath sounds normal. She has no wheezes. She has no rales. She exhibits no tenderness.  Abdominal: Soft. Bowel sounds are normal. She exhibits no distension and no mass. There is no tenderness.  Musculoskeletal: Normal range of motion. She exhibits no edema.  Av fistula present in left anti cubital  with thrill present.   Lymphadenopathy:    She has no cervical adenopathy.  Neurological: She is alert and oriented to person, place, and time. No cranial nerve deficit. She exhibits normal muscle tone. Coordination normal.  Skin: Skin is warm and dry. No rash noted.  Psychiatric: She has a normal mood and affect. Her behavior is normal. Judgment and thought content normal.  Nursing note and vitals reviewed.   ED Course  Procedures (including critical care time) DIAGNOSTIC STUDIES: Oxygen Saturation is 98% on RA, normal by my interpretation.    COORDINATION OF CARE: 11:12 PM-Discussed treatment plan with pt at bedside and pt agreed to plan.     Labs Review Results for orders placed or performed during the hospital encounter of 10/16/14  CBC with Differential  Result Value Ref Range   WBC 8.2 4.0 - 10.5 K/uL   RBC 3.02 (L) 3.87 - 5.11 MIL/uL   Hemoglobin 9.9 (L) 12.0 - 15.0 g/dL   HCT 30.9 (L) 36.0 - 46.0 %   MCV 102.3 (H) 78.0 - 100.0 fL   MCH 32.8 26.0 - 34.0 pg   MCHC 32.0 30.0 - 36.0 g/dL   RDW 15.6 (H) 11.5 - 15.5 %   Platelets 269 150 - 400 K/uL   Neutrophils Relative % 75 43 - 77 %   Neutro Abs 6.2 1.7 - 7.7 K/uL   Lymphocytes Relative 13 12 - 46 %   Lymphs Abs 1.1 0.7 - 4.0 K/uL   Monocytes Relative 5 3 - 12 %   Monocytes Absolute 0.4 0.1 - 1.0  K/uL   Eosinophils Relative 6 (H) 0 - 5 %   Eosinophils Absolute 0.5 0.0 - 0.7 K/uL   Basophils Relative 1 0 - 1 %   Basophils Absolute 0.1 0.0 - 0.1 K/uL  Comprehensive metabolic panel  Result Value Ref Range   Sodium 136 135 - 145 mmol/L   Potassium 3.9 3.5 - 5.1 mmol/L   Chloride 96 96 - 112 mmol/L   CO2 27 19 - 32 mmol/L   Glucose, Bld 125 (H) 70 - 99 mg/dL   BUN 42 (H) 6 - 23 mg/dL   Creatinine, Ser 8.92 (H) 0.50 - 1.10 mg/dL   Calcium 9.0 8.4 - 10.5 mg/dL   Total Protein 6.8 6.0 - 8.3 g/dL   Albumin 3.8 3.5 - 5.2 g/dL   AST 14 0 - 37 U/L   ALT 14 0 - 35 U/L   Alkaline Phosphatase 107 39 - 117 U/L   Total Bilirubin 0.5 0.3 - 1.2 mg/dL   GFR calc non Af Amer 4 (L) >90 mL/min   GFR calc Af Amer 4 (L) >90 mL/min   Anion gap 13 5 - 15   Imaging Review Dg Chest 2 View  10/16/2014   CLINICAL DATA:  Elevated blood pressure and rapid heart rate for 1  day.  EXAM: CHEST  2 VIEW  COMPARISON:  09/29/2014  FINDINGS: Postoperative changes in the mediastinum. Borderline heart size without vascular congestion. No focal airspace disease or consolidation in the lungs. Linear atelectasis in the right lung base. No blunting of costophrenic angles. No pneumothorax. Calcified and tortuous aorta.  IMPRESSION: No evidence of active pulmonary disease. Atelectasis in the right lung base.   Electronically Signed   By: Lucienne Capers M.D.   On: 10/16/2014 23:55   ECG 10/16/2014 at 2237 Normal Sinus Rhythm 79 beats per minute Normal axis Normal intervals Left ventricular hypertrophy with secondary repolarization changes  Compared with ECG of 09/29/2014, no significant changes are seen  MDM   Final diagnoses:  Cough  Palpitations  Essential hypertension  End-stage renal disease on hemodialysis  Macrocytic anemia      I personally performed the services described in this documentation, which was scribed in my presence. The recorded information has been reviewed and is accurate.       Delora Fuel, MD [id number]

## 2014-10-17 NOTE — ED Notes (Signed)
Discharge instructions given, pt demonstrated teach back and verbal understanding. No concerns voiced.  

## 2014-10-17 NOTE — Telephone Encounter (Signed)
Patient went to [facility] ED last evening.

## 2014-10-17 NOTE — Telephone Encounter (Signed)
Please advise if you agree to patient getting monitor.  She is due to see you 12/19/2014.  Or does she need office visit to discuss further first?

## 2014-10-17 NOTE — Discharge Instructions (Signed)
Talk with your primary care doctor about possibly getting a Holter Monitor or an Event Monitor..  Palpitations A palpitation is the feeling that your heartbeat is irregular or is faster than normal. It may feel like your heart is fluttering or skipping a beat. Palpitations are usually not a serious problem. However, in some cases, you may need further medical evaluation. CAUSES  Palpitations can be caused by:  Smoking.  Caffeine or other stimulants, such as diet pills or energy drinks.  Alcohol.  Stress and anxiety.  Strenuous physical activity.  Fatigue.  Certain medicines.  Heart disease, especially if you have a history of irregular heart rhythms (arrhythmias), such as atrial fibrillation, atrial flutter, or supraventricular tachycardia.  An improperly working pacemaker or defibrillator. DIAGNOSIS  To find the cause of your palpitations, your health care provider will take your medical history and perform a physical exam. Your health care provider may also have you take a test called an ambulatory electrocardiogram (ECG). An ECG records your heartbeat patterns over a 24-hour period. You may also have other tests, such as:  Transthoracic echocardiogram (TTE). During echocardiography, sound waves are used to evaluate how blood flows through your heart.  Transesophageal echocardiogram (TEE).  Cardiac monitoring. This allows your health care provider to monitor your heart rate and rhythm in real time.  Holter monitor. This is a portable device that records your heartbeat and can help diagnose heart arrhythmias. It allows your health care provider to track your heart activity for several days, if needed.  Stress tests by exercise or by giving medicine that makes the heart beat faster. TREATMENT  Treatment of palpitations depends on the cause of your symptoms and can vary greatly. Most cases of palpitations do not require any treatment other than time, relaxation, and monitoring  your symptoms. Other causes, such as atrial fibrillation, atrial flutter, or supraventricular tachycardia, usually require further treatment. HOME CARE INSTRUCTIONS   Avoid:  Caffeinated coffee, tea, soft drinks, diet pills, and energy drinks.  Chocolate.  Alcohol.  Stop smoking if you smoke.  Reduce your stress and anxiety. Things that can help you relax include:  A method of controlling things in your body, such as your heartbeats, with your mind (biofeedback).  Yoga.  Meditation.  Physical activity such as swimming, jogging, or walking.  Get plenty of rest and sleep. SEEK MEDICAL CARE IF:   You continue to have a fast or irregular heartbeat beyond 24 hours.  Your palpitations occur more often. SEEK IMMEDIATE MEDICAL CARE IF:  You have chest pain or shortness of breath.  You have a severe headache.  You feel dizzy or you faint. MAKE SURE YOU:  Understand these instructions.  Will watch your condition.  Will get help right away if you are not doing well or get worse. Document Released: 06/24/2000 Document Revised: 07/02/2013 Document Reviewed: 08/26/2011 [facility] Patient Information 2015 [place], [place]. This information is not intended to replace advice given to you by your health care provider. Make sure you discuss any questions you have with your health care provider.  Hypertension Hypertension, commonly called high blood pressure, is when the force of blood pumping through your arteries is too strong. Your arteries are the blood vessels that carry blood from your heart throughout your body. A blood pressure reading consists of a higher number over a lower number, such as 110/72. The higher number (systolic) is the pressure inside your arteries when your heart pumps. The lower number (diastolic) is the pressure inside your arteries when  your heart relaxes. Ideally you want your blood pressure below 120/80. Hypertension forces your heart to work harder to pump  blood. Your arteries may become narrow or stiff. Having hypertension puts you at risk for heart disease, stroke, and other problems.  RISK FACTORS Some risk factors for high blood pressure are controllable. Others are not.  Risk factors you cannot control include:   Race. You may be at higher risk if you are African American.  Age. Risk increases with age.  Gender. Men are at higher risk than women before age 85 years. After age 28, women are at higher risk than men. Risk factors you can control include:  Not getting enough exercise or physical activity.  Being overweight.  Getting too much fat, sugar, calories, or salt in your diet.  Drinking too much alcohol. SIGNS AND SYMPTOMS Hypertension does not usually cause signs or symptoms. Extremely high blood pressure (hypertensive crisis) may cause headache, anxiety, shortness of breath, and nosebleed. DIAGNOSIS  To check if you have hypertension, your health care provider will measure your blood pressure while you are seated, with your arm held at the level of your heart. It should be measured at least twice using the same arm. Certain conditions can cause a difference in blood pressure between your right and left arms. A blood pressure reading that is higher than normal on one occasion does not mean that you need treatment. If one blood pressure reading is high, ask your health care provider about having it checked again. TREATMENT  Treating high blood pressure includes making lifestyle changes and possibly taking medicine. Living a healthy lifestyle can help lower high blood pressure. You may need to change some of your habits. Lifestyle changes may include:  Following the DASH diet. This diet is high in fruits, vegetables, and whole grains. It is low in salt, red meat, and added sugars.  Getting at least 2 hours of brisk physical activity every week.  Losing weight if necessary.  Not smoking.  Limiting alcoholic  beverages.  Learning ways to reduce stress. If lifestyle changes are not enough to get your blood pressure under control, your health care provider may prescribe medicine. You may need to take more than one. Work closely with your health care provider to understand the risks and benefits. HOME CARE INSTRUCTIONS  Have your blood pressure rechecked as directed by your health care provider.   Take medicines only as directed by your health care provider. Follow the directions carefully. Blood pressure medicines must be taken as prescribed. The medicine does not work as well when you skip doses. Skipping doses also puts you at risk for problems.   Do not smoke.   Monitor your blood pressure at home as directed by your health care provider. SEEK MEDICAL CARE IF:   You think you are having a reaction to medicines taken.  You have recurrent headaches or feel dizzy.  You have swelling in your ankles.  You have trouble with your vision. SEEK IMMEDIATE MEDICAL CARE IF:  You develop a severe headache or confusion.  You have unusual weakness, numbness, or feel faint.  You have severe chest or abdominal pain.  You vomit repeatedly.  You have trouble breathing. MAKE SURE YOU:   Understand these instructions.  Will watch your condition.  Will get help right away if you are not doing well or get worse. Document Released: 06/27/2005 Document Revised: 11/11/2013 Document Reviewed: 04/19/2013 [facility] Patient Information 2015 [place], [place]. This information is not intended to  replace advice given to you by your health care provider. Make sure you discuss any questions you have with your health care provider. ° °

## 2014-10-17 NOTE — Telephone Encounter (Signed)
Patient was advised by AP to contact our office about having a monitor placed she is  correctly at dialysis and would like for you to call her cell phone today

## 2014-10-20 NOTE — Telephone Encounter (Signed)
Please go ahead and proceed with a one week event monitor.

## 2014-10-20 NOTE — Telephone Encounter (Signed)
Patient calling back to inquire about monitor.  Advised patient will let her know as soon as MD replies to message.

## 2014-10-21 NOTE — Addendum Note (Signed)
Addended by: Laurine Blazer on: 10/21/2014 09:39 AM   Modules accepted: Orders

## 2014-10-21 NOTE — Telephone Encounter (Signed)
Patient notified.  Will order monitor today.

## 2014-10-24 DIAGNOSIS — R002 Palpitations: Secondary | ICD-10-CM | POA: Diagnosis not present

## 2014-11-04 ENCOUNTER — Other Ambulatory Visit: Payer: Self-pay | Admitting: Cardiovascular Disease

## 2014-11-04 MED ORDER — SIMVASTATIN 20 MG PO TABS: 20.0000 mg | ORAL_TABLET | Freq: Every day | ORAL | Status: DC

## 2014-11-04 NOTE — Telephone Encounter (Signed)
Received fax refill request  Rx # [id number] Medication:  Simvastatin 20 mg tab Qty 90 Sig:  Take one tablet by mouth at bedtime Physician:  Bronson Ing

## 2014-11-10 ENCOUNTER — Emergency Department (HOSPITAL_COMMUNITY)
Admission: EM | Admit: 2014-11-10 | Discharge: 2014-11-10 | Disposition: A | Discharge status: Home / Self Care | Payer: Medicare Other | Attending: Emergency Medicine | Admitting: Emergency Medicine

## 2014-11-10 ENCOUNTER — Encounter (HOSPITAL_COMMUNITY): Payer: Self-pay | Admitting: *Deleted

## 2014-11-10 ENCOUNTER — Emergency Department (HOSPITAL_COMMUNITY): Payer: Medicare Other

## 2014-11-10 DIAGNOSIS — R0602 Shortness of breath: Secondary | ICD-10-CM | POA: Diagnosis present

## 2014-11-10 DIAGNOSIS — E780 Pure hypercholesterolemia: Secondary | ICD-10-CM | POA: Insufficient documentation

## 2014-11-10 DIAGNOSIS — Z8701 Personal history of pneumonia (recurrent): Secondary | ICD-10-CM | POA: Diagnosis not present

## 2014-11-10 DIAGNOSIS — Z862 Personal history of diseases of the blood and blood-forming organs and certain disorders involving the immune mechanism: Secondary | ICD-10-CM | POA: Insufficient documentation

## 2014-11-10 DIAGNOSIS — R0789 Other chest pain: Secondary | ICD-10-CM | POA: Insufficient documentation

## 2014-11-10 DIAGNOSIS — Z8709 Personal history of other diseases of the respiratory system: Secondary | ICD-10-CM | POA: Diagnosis not present

## 2014-11-10 DIAGNOSIS — Z992 Dependence on renal dialysis: Secondary | ICD-10-CM | POA: Insufficient documentation

## 2014-11-10 DIAGNOSIS — Z951 Presence of aortocoronary bypass graft: Secondary | ICD-10-CM | POA: Insufficient documentation

## 2014-11-10 DIAGNOSIS — Z7982 Long term (current) use of aspirin: Secondary | ICD-10-CM | POA: Diagnosis not present

## 2014-11-10 DIAGNOSIS — I2510 Atherosclerotic heart disease of native coronary artery without angina pectoris: Secondary | ICD-10-CM | POA: Insufficient documentation

## 2014-11-10 DIAGNOSIS — Z7951 Long term (current) use of inhaled steroids: Secondary | ICD-10-CM | POA: Diagnosis not present

## 2014-11-10 DIAGNOSIS — Z8711 Personal history of peptic ulcer disease: Secondary | ICD-10-CM | POA: Insufficient documentation

## 2014-11-10 DIAGNOSIS — Z79899 Other long term (current) drug therapy: Secondary | ICD-10-CM | POA: Diagnosis not present

## 2014-11-10 DIAGNOSIS — Z9861 Coronary angioplasty status: Secondary | ICD-10-CM | POA: Diagnosis not present

## 2014-11-10 DIAGNOSIS — Z8543 Personal history of malignant neoplasm of ovary: Secondary | ICD-10-CM | POA: Diagnosis not present

## 2014-11-10 DIAGNOSIS — Z9889 Other specified postprocedural states: Secondary | ICD-10-CM | POA: Diagnosis not present

## 2014-11-10 DIAGNOSIS — I120 Hypertensive chronic kidney disease with stage 5 chronic kidney disease or end stage renal disease: Secondary | ICD-10-CM | POA: Insufficient documentation

## 2014-11-10 DIAGNOSIS — I5032 Chronic diastolic (congestive) heart failure: Secondary | ICD-10-CM | POA: Insufficient documentation

## 2014-11-10 DIAGNOSIS — N186 End stage renal disease: Secondary | ICD-10-CM | POA: Insufficient documentation

## 2014-11-10 DIAGNOSIS — Z880 Allergy status to penicillin: Secondary | ICD-10-CM | POA: Diagnosis not present

## 2014-11-10 DIAGNOSIS — K219 Gastro-esophageal reflux disease without esophagitis: Secondary | ICD-10-CM | POA: Diagnosis not present

## 2014-11-10 DIAGNOSIS — Z792 Long term (current) use of antibiotics: Secondary | ICD-10-CM | POA: Diagnosis not present

## 2014-11-10 DIAGNOSIS — I252 Old myocardial infarction: Secondary | ICD-10-CM | POA: Insufficient documentation

## 2014-11-10 DIAGNOSIS — F419 Anxiety disorder, unspecified: Secondary | ICD-10-CM | POA: Insufficient documentation

## 2014-11-10 DIAGNOSIS — R079 Chest pain, unspecified: Secondary | ICD-10-CM

## 2014-11-10 LAB — CBC WITH DIFFERENTIAL/PLATELET
BASOS ABS: 0.1 10*3/uL (ref 0.0–0.1)
Basophils Relative: 1 % (ref 0–1)
EOS PCT: 7 % — AB (ref 0–5)
Eosinophils Absolute: 0.8 10*3/uL — ABNORMAL HIGH (ref 0.0–0.7)
HCT: 33.2 % — ABNORMAL LOW (ref 36.0–46.0)
Hemoglobin: 10.5 g/dL — ABNORMAL LOW (ref 12.0–15.0)
LYMPHS PCT: 9 % — AB (ref 12–46)
Lymphs Abs: 1 10*3/uL (ref 0.7–4.0)
MCH: 31.8 pg (ref 26.0–34.0)
MCHC: 31.6 g/dL (ref 30.0–36.0)
MCV: 100.6 fL — ABNORMAL HIGH (ref 78.0–100.0)
MONO ABS: 0.4 10*3/uL (ref 0.1–1.0)
MONOS PCT: 4 % (ref 3–12)
Neutro Abs: 8.4 10*3/uL — ABNORMAL HIGH (ref 1.7–7.7)
Neutrophils Relative %: 79 % — ABNORMAL HIGH (ref 43–77)
Platelets: 282 10*3/uL (ref 150–400)
RBC: 3.3 MIL/uL — AB (ref 3.87–5.11)
RDW: 16.1 % — AB (ref 11.5–15.5)
WBC: 10.6 10*3/uL — AB (ref 4.0–10.5)

## 2014-11-10 LAB — BASIC METABOLIC PANEL
ANION GAP: 15 (ref 5–15)
BUN: 65 mg/dL — AB (ref 6–20)
CALCIUM: 8.3 mg/dL — AB (ref 8.9–10.3)
CO2: 24 mmol/L (ref 22–32)
Chloride: 99 mmol/L — ABNORMAL LOW (ref 101–111)
Creatinine, Ser: 11.82 mg/dL — ABNORMAL HIGH (ref 0.44–1.00)
GFR calc Af Amer: 3 mL/min — ABNORMAL LOW (ref >60)
GFR calc non Af Amer: 3 mL/min — ABNORMAL LOW (ref >60)
GLUCOSE: 105 mg/dL — AB (ref 70–99)
Potassium: 4.9 mmol/L (ref 3.5–5.1)
SODIUM: 138 mmol/L (ref 135–145)

## 2014-11-10 LAB — TROPONIN I: Troponin I: <0.03 ng/mL (ref <0.031)

## 2014-11-10 IMAGING — CR DG CHEST 1V PORT
1 series · 1 of 1 positions shown · non-contrast
Comparison: [DATE]

CLINICAL DATA: Dyspnea of the started around [DATE].

EXAM:
PORTABLE CHEST - 1 VIEW

[ap portable]
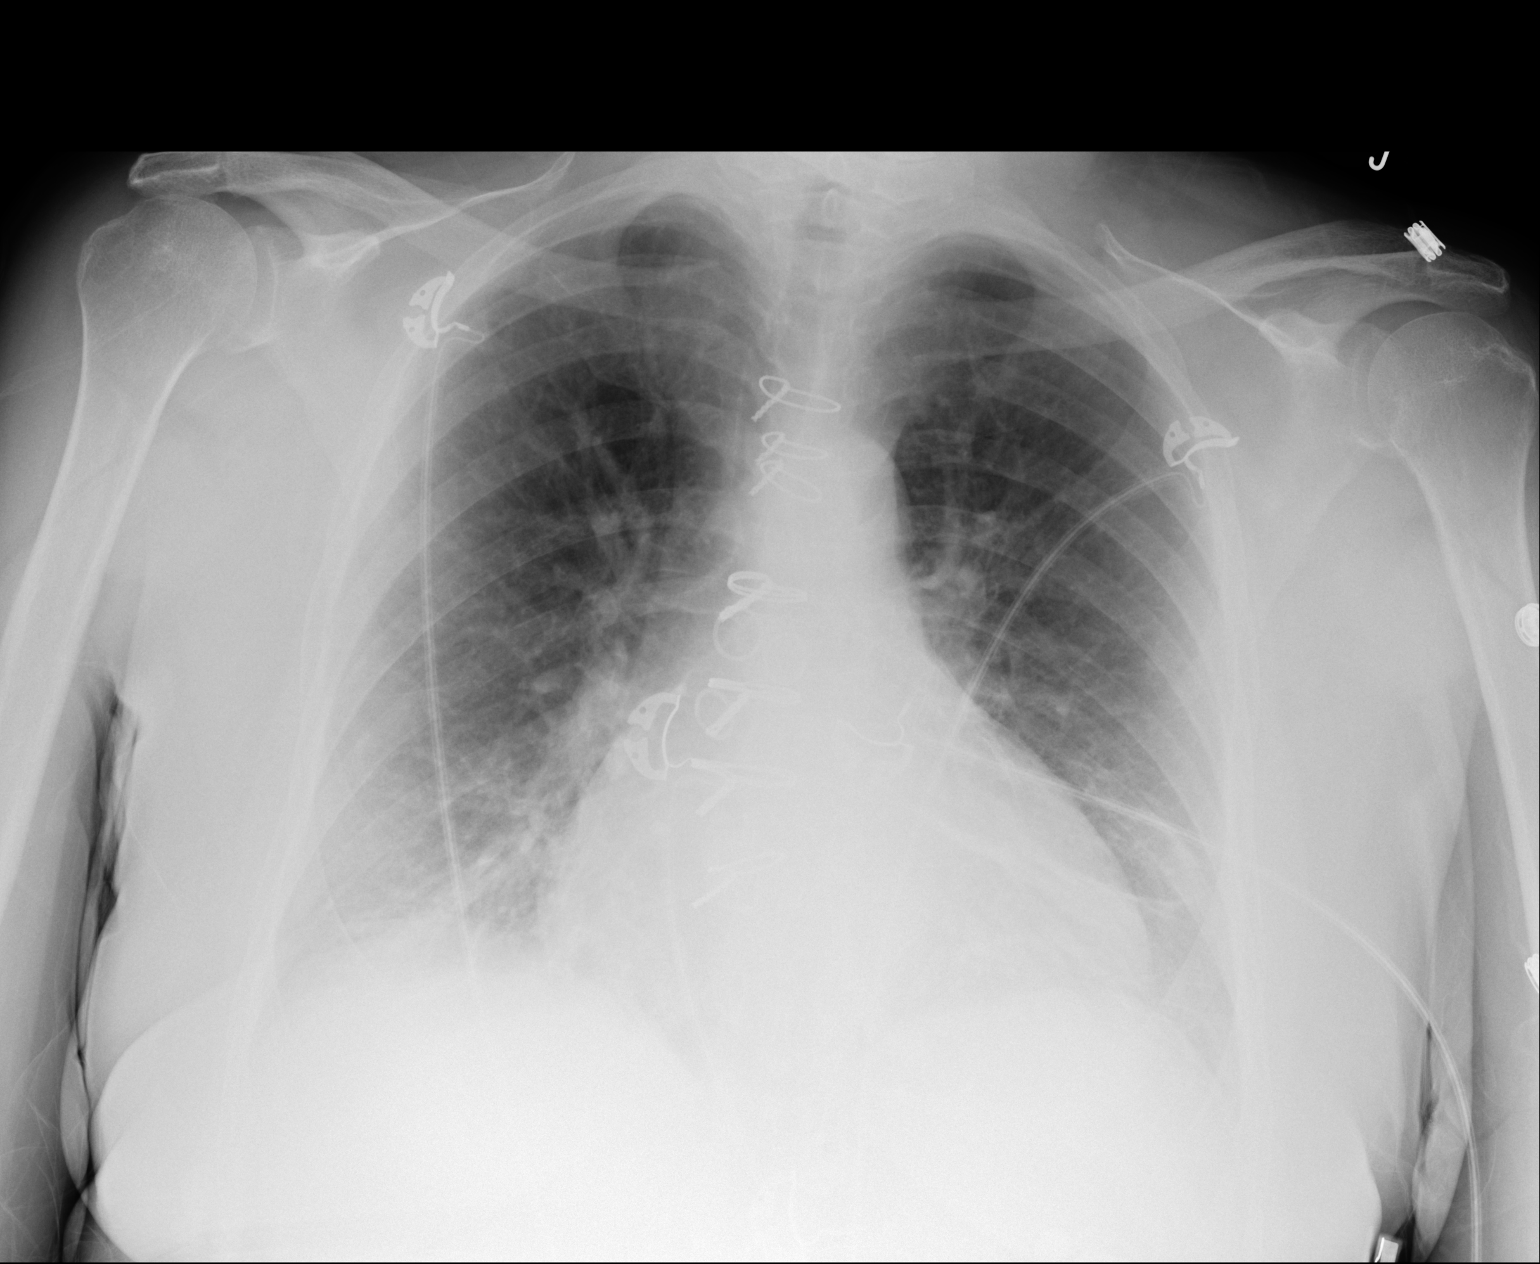

[1 of 1 positions shown; findings below may reference images not displayed]

FINDINGS: There is mild accentuation of basilar markings but this likely is
due to the shallow inspiration. There is no confluent airspace
consolidation. There are no large effusions. There is unchanged mild
cardiomegaly. There is prior sternotomy and CABG.
IMPRESSION: Shallow inspiration with mild basilar crowding. No confluent
airspace consolidation or large effusion.

## 2014-11-10 MED ORDER — NITROGLYCERIN 0.4 MG SL SUBL: 0.4000 mg | SUBLINGUAL_TABLET | SUBLINGUAL | Status: DC | PRN

## 2014-11-10 MED ORDER — ASPIRIN 81 MG PO CHEW
162.0000 mg | CHEWABLE_TABLET | Freq: Once | ORAL | Status: AC
  Administered 2014-11-10: 162 mg via ORAL
  Filled 2014-11-10: qty 2

## 2014-11-10 MED ORDER — ONDANSETRON HCL 4 MG/2ML IJ SOLN
4.0000 mg | Freq: Once | INTRAMUSCULAR | Status: AC
  Administered 2014-11-10: 4 mg via INTRAVENOUS
  Filled 2014-11-10: qty 2

## 2014-11-10 NOTE — Discharge Instructions (Signed)
Go for your dialysis today as scheduled. Try to limit your intake of sodium (salt).  Chest Pain (Nonspecific) It is often hard to give a specific diagnosis for the cause of chest pain. There is always a chance that your pain could be related to something serious, such as a heart attack or a blood clot in the lungs. You need to follow up with your health care provider for further evaluation. CAUSES   Heartburn.  Pneumonia or bronchitis.  Anxiety or stress.  Inflammation around your heart (pericarditis) or lung (pleuritis or pleurisy).  A blood clot in the lung.  A collapsed lung (pneumothorax). It can develop suddenly on its own (spontaneous pneumothorax) or from trauma to the chest.  Shingles infection (herpes zoster virus). The chest wall is composed of bones, muscles, and cartilage. Any of these can be the source of the pain.  The bones can be bruised by injury.  The muscles or cartilage can be strained by coughing or overwork.  The cartilage can be affected by inflammation and become sore (costochondritis). DIAGNOSIS  Lab tests or other studies may be needed to find the cause of your pain. Your health care provider may have you take a test called an ambulatory electrocardiogram (ECG). An ECG records your heartbeat patterns over a 24-hour period. You may also have other tests, such as:  Transthoracic echocardiogram (TTE). During echocardiography, sound waves are used to evaluate how blood flows through your heart.  Transesophageal echocardiogram (TEE).  Cardiac monitoring. This allows your health care provider to monitor your heart rate and rhythm in real time.  Holter monitor. This is a portable device that records your heartbeat and can help diagnose heart arrhythmias. It allows your health care provider to track your heart activity for several days, if needed.  Stress tests by exercise or by giving medicine that makes the heart beat faster. TREATMENT   Treatment depends  on what may be causing your chest pain. Treatment may include:  Acid blockers for heartburn.  Anti-inflammatory medicine.  Pain medicine for inflammatory conditions.  Antibiotics if an infection is present.  You may be advised to change lifestyle habits. This includes stopping smoking and avoiding alcohol, caffeine, and chocolate.  You may be advised to keep your head raised (elevated) when sleeping. This reduces the chance of acid going backward from your stomach into your esophagus. Most of the time, nonspecific chest pain will improve within 2-3 days with rest and mild pain medicine.  HOME CARE INSTRUCTIONS   If antibiotics were prescribed, take them as directed. Finish them even if you start to feel better.  For the next few days, avoid physical activities that bring on chest pain. Continue physical activities as directed.  Do not use any tobacco products, including cigarettes, chewing tobacco, or electronic cigarettes.  Avoid drinking alcohol.  Only take medicine as directed by your health care provider.  Follow your health care provider's suggestions for further testing if your chest pain does not go away.  Keep any follow-up appointments you made. If you do not go to an appointment, you could develop lasting (chronic) problems with pain. If there is any problem keeping an appointment, call to reschedule. SEEK MEDICAL CARE IF:   Your chest pain does not go away, even after treatment.  You have a rash with blisters on your chest.  You have a fever. SEEK IMMEDIATE MEDICAL CARE IF:   You have increased chest pain or pain that spreads to your arm, neck, jaw, back, or  abdomen.  You have shortness of breath.  You have an increasing cough, or you cough up blood.  You have severe back or abdominal pain.  You feel nauseous or vomit.  You have severe weakness.  You faint.  You have chills. This is an emergency. Do not wait to see if the pain will go away. Get medical  help at once. Call your local emergency services (911 in [place] Do not drive yourself to the hospital. MAKE SURE YOU:   Understand these instructions.  Will watch your condition.  Will get help right away if you are not doing well or get worse. Document Released: 04/06/2005 Document Revised: 07/02/2013 Document Reviewed: 01/31/2008 [facility] Patient Information [address], [place]. This information is not intended to replace advice given to you by your health care provider. Make sure you discuss any questions you have with your health care provider.

## 2014-11-10 NOTE — ED Notes (Addendum)
Documented in error.

## 2014-11-10 NOTE — ED Notes (Signed)
Pt c/o sob that started around 2000; pt states she was diaphoretic and feeling like her heart was fluttering

## 2014-11-10 NOTE — ED Provider Notes (Signed)
CSN: [id number]     Arrival date & time 11/10/14  0224 History   None    Chief Complaint  Patient presents with  . Shortness of Breath     (Consider location/radiation/quality/duration/timing/severity/associated sxs/prior Treatment) Patient is a 74 y.o. female presenting with shortness of breath. The history is provided by the patient.  Shortness of Breath She had onset about midnight of a tight feeling in her chest with associated dyspnea, nausea, diaphoresis. There is no radiation of the tightness. Nothing made it better nothing made it worse. She rated it at 8/10 at its worst but has subsided to 4/10. She is a dialysis patient with last dialysis 3 days ago. She has noticed some ankle swelling which is unusual for her. She did not try any treatment at home. Of note, she is allergic to aspirin, but they have allergy consists of GI bleeding only when she takes a standard dose aspirin. She does take 81 mg aspirin on a daily basis. She does have a history of a non-STEMI in 2013 with stent placement and subsequent coronary artery bypass surgery.  Past Medical History  Diagnosis Date  . Chronic bronchitis   . GERD (gastroesophageal reflux disease)   . PUD (peptic ulcer disease)   . History of lower GI bleeding   . Arthritis   . History of gout   . CAD (coronary artery disease)     a. 12/2011 NSTEMI/Cath/PCI LCX (2.25x14 Resolute DES) & D1 (2.25x22 Resolute DES);  b. 01/2012 Cath/PCI: LM 30, LAD 30p, 40-74m, D1 stent ok, 99 in sm branch of diag, LCX patent stent, OM1 20, RCA 95 ost (4.0x12 Promus DES), EF 55%;  c. 04/2012 Lexi Cardiolite  EF 48%, small area of scar @ base/mid inflat wall with mild peri-infarct ischemia.; CABG 12/4  . High cholesterol 12/2011  . Pneumonia ~ 2009  . Iron deficiency anemia   . TIA (transient ischemic attack)   . Anxiety   . History of blood transfusion 07/2011; 12/2011; 01/2012 X 2; 04/2012  . Carotid artery disease     a. [id number] LICA, [id number]   . Mitral  regurgitation     a. Moderate by echo, 02/2012  . Myocardial infarction   . Chronic diastolic CHF (congestive heart failure)     a. 02/2012 Echo EF 60-65%, nl wall motion, Gr 1 DD, mod MR  . Hypertension   . AVM (arteriovenous malformation) of colon   . Esophageal stricture   . Ovarian cancer 1992  . Colon cancer 1992  . ESRD on hemodialysis     ESRD due to HTN, started dialysis 2011 and gets HD at [facility] with Dr Hinda Lenis on MWF schedule.  Access is LUA AVF as of Sept 2014.    Past Surgical History  Procedure Laterality Date  . Abdominal hysterectomy  1992  . Appendectomy  06/1990  . Tubal ligation  1980's  . Av fistula placement  07/2009    left upper arm  . Thrombectomy / arteriovenous graft revision  2011    left upper arm  . Colon resection  1992  . Esophagogastroduodenoscopy  01/20/2012    Procedure: ESOPHAGOGASTRODUODENOSCOPY (EGD);  Surgeon: Ladene Artist, MD,FACG;  Location: [facility] ENDOSCOPY;  Service: Endoscopy;  Laterality: N/A;  . Dilation and curettage of uterus    . Coronary angioplasty with stent placement  12/15/11    "2"  . Coronary angioplasty with stent placement  y/2013    "1; makes total of 3" (05/02/2012)  . Coronary artery  bypass graft  06/13/2012    Procedure: CORONARY ARTERY BYPASS GRAFTING (CABG);  Surgeon: Grace Isaac, MD;  Location: [place];  Service: Open Heart Surgery;  Laterality: N/A;  cabg x four;  using left internal mammary artery, and left leg greater saphenous vein harvested endoscopically  . Intraoperative transesophageal echocardiogram  06/13/2012    Procedure: INTRAOPERATIVE TRANSESOPHAGEAL ECHOCARDIOGRAM;  Surgeon: Grace Isaac, MD;  Location: [place];  Service: Open Heart Surgery;  Laterality: N/A;  . Esophagogastroduodenoscopy N/A 03/26/2013    Procedure: ESOPHAGOGASTRODUODENOSCOPY (EGD);  Surgeon: Irene Shipper, MD;  Location: [facility] ENDOSCOPY;  Service: Endoscopy;  Laterality: N/A;  . Ovary surgery      ovarian cancer  . Cardiac surgery     . Left heart catheterization with coronary angiogram N/A 12/15/2011    Procedure: LEFT HEART CATHETERIZATION WITH CORONARY ANGIOGRAM;  Surgeon: Burnell Blanks, MD;  Location: [facility] CATH LAB;  Service: Cardiovascular;  Laterality: N/A;  . Left heart catheterization with coronary angiogram N/A 01/10/2012    Procedure: LEFT HEART CATHETERIZATION WITH CORONARY ANGIOGRAM;  Surgeon: Peter M Martinique, MD;  Location: [facility] CATH LAB;  Service: Cardiovascular;  Laterality: N/A;  . Left heart catheterization with coronary angiogram N/A 06/08/2012    Procedure: LEFT HEART CATHETERIZATION WITH CORONARY ANGIOGRAM;  Surgeon: Burnell Blanks, MD;  Location: [facility] CATH LAB;  Service: Cardiovascular;  Laterality: N/A;  . Shuntogram N/A 10/15/2013    Procedure: Fistulogram;  Surgeon: Serafina Mitchell, MD;  Location: [facility] CATH LAB;  Service: Cardiovascular;  Laterality: N/A;  . Left heart catheterization with coronary/graft angiogram N/A 12/10/2013    Procedure: LEFT HEART CATHETERIZATION WITH Beatrix Fetters;  Surgeon: [name], MD;  Location: [facility] CATH LAB;  Service: Cardiovascular;  Laterality: N/A;   Family History  Problem Relation Age of Onset  . Other      noncontributory for early CAD  . Heart disease Mother     Heart Disease before age 61  . Hyperlipidemia Mother   . Hypertension Mother   . Diabetes Mother   . Heart attack Mother   . Heart disease Father     Heart Disease before age 40  . Hyperlipidemia Father   . Hypertension Father   . Diabetes Father   . Diabetes Sister   . Hypertension Sister   . Diabetes Brother   . Hyperlipidemia Brother   . Heart attack Brother   . Colon cancer Neg Hx   . Esophageal cancer Neg Hx   . Liver disease Neg Hx   . Kidney disease Neg Hx   . Colon polyps Neg Hx   . Hypertension Sister   . Heart attack Brother    History  Substance Use Topics  . Smoking status: Never Smoker   . Smokeless tobacco: Never Used  . Alcohol Use: No   OB History     No data available     Review of Systems  Respiratory: Positive for shortness of breath.   All other systems reviewed and are negative.     Allergies  Aspirin; Contrast media; Iron; Macrodantin; Penicillins; Plavix; Bactrim; Sulfa antibiotics; Venofer; Dexilant; Morphine and related; Prilosec; Levaquin; and Protonix  Home Medications   Prior to Admission medications   Medication Sig Start Date End Date Taking? Authorizing Provider  acetaminophen (TYLENOL) 325 MG tablet Take 2 tablets (650 mg total) by mouth every 6 (six) hours as needed for mild pain (mild pain). 10/02/14   Erline Hau, MD  ALPRAZolam Duanne Moron) 0.5  MG tablet Take 0.5 mg by mouth 3 (three) times daily.    Historical Provider, MD  amLODipine (NORVASC) 10 MG tablet Take 1 tablet (10 mg total) by mouth daily. 12/17/13   Lendon Colonel, NP  aspirin EC 81 MG tablet Take 81 mg by mouth daily.    Historical Provider, MD  benzonatate (TESSALON PERLES) 100 MG capsule Take 1 capsule (100 mg total) by mouth 3 (three) times daily as needed for cough. 10/02/14   Erline Hau, MD  cloNIDine (CATAPRES) 0.2 MG tablet Take 1 tablet (0.2 mg total) by mouth 2 (two) times daily. 12/23/13   Fay Records, MD  dextromethorphan-guaiFENesin [facility] DM) 30-600 MG per 12 hr tablet Take 1 tablet by mouth daily. 09/29/14   Fredia Sorrow, MD  doxycycline (VIBRA-TABS) 100 MG tablet Take 1 tablet (100 mg total) by mouth 2 (two) times daily. 10/02/14   Erline Hau, MD  fluconazole (DIFLUCAN) 150 MG tablet Take 1 tablet (150 mg total) by mouth once. 10/02/14   Erline Hau, MD  fluticasone (FLONASE) 50 MCG/ACT nasal spray Place 2 sprays into the nose daily as needed for allergies.     Historical Provider, MD  folic acid-vitamin b complex-vitamin c-selenium-zinc (DIALYVITE) 3 MG TABS Take 1 tablet by mouth daily.    Historical Provider, MD  hydrALAZINE (APRESOLINE) 25 MG tablet Take 1 tablet (25 mg  total) by mouth 3 (three) times daily. 09/16/14   Herminio Commons, MD  isosorbide dinitrate (ISORDIL) 30 MG tablet Take 1 tablet (30 mg total) by mouth 3 (three) times daily. 10/02/14   Erline Hau, MD  labetalol (NORMODYNE) 200 MG tablet Take 200 mg by mouth 2 (two) times daily.    Historical Provider, MD  lanthanum (FOSRENOL) 1000 MG chewable tablet Chew 1,000 mg by mouth 3 (three) times daily after meals.    Historical Provider, MD  loratadine (CLARITIN) 10 MG tablet Take 10 mg by mouth daily.     Historical Provider, MD  nitroGLYCERIN (NITROSTAT) 0.4 MG SL tablet Place 1 tablet (0.4 mg total) under the tongue every 5 (five) minutes x 3 doses as needed for chest pain. 06/23/14   Herminio Commons, MD  omeprazole (PRILOSEC) 20 MG capsule Take 1 capsule (20 mg total) by mouth daily. 02/10/14   Ladene Artist, MD  polyethylene glycol [facility] Floria Raveling) packet Take 17 g by mouth daily. 10/02/14   Erline Hau, MD  SENSIPAR 30 MG tablet Take 30 mg by mouth daily with supper.  12/21/12   Historical Provider, MD  simvastatin (ZOCOR) 20 MG tablet Take 1 tablet (20 mg total) by mouth at bedtime. 11/04/14   Herminio Commons, MD  tetrahydrozoline 0.05 % ophthalmic solution Place 1 drop into both eyes 2 (two) times daily as needed (irritation).    Historical Provider, MD  zolpidem (AMBIEN) 10 MG tablet Take 10 mg by mouth at bedtime as needed for sleep.  05/27/13   Historical Provider, MD   BP 218/81 mmHg  Pulse 78  Temp(Src) 98.7 F (37.1 C)  Resp 20  Ht 5\' 1"  (1.549 m)  Wt 145 lb (65.772 kg)  BMI 27.41 kg/m2  SpO2 98% Physical Exam  Nursing note and vitals reviewed.  74 year old female, resting comfortably and in no acute distress. Vital signs are significant for hypertension. Oxygen saturation is 98%, which is normal. Head is normocephalic and atraumatic. PERRLA, EOMI. Oropharynx is clear. Neck is nontender  and supple without adenopathy. JVD is present. Back is  nontender and there is no CVA tenderness. 1+ presacral edema is present. Lungs are clear without rales, wheezes, or rhonchi. Chest is nontender. Heart has regular rate and rhythm without murmur. Abdomen is soft, flat, nontender without masses or hepatosplenomegaly and peristalsis is normoactive. Extremities have 2+ pretibial edema, full range of motion is present. AV fistula is present in the left upper arm with thrill present. Skin is warm and dry without rash. Neurologic: Mental status is normal, cranial nerves are intact, there are no motor or sensory deficits.  ED Course  Procedures (including critical care time) Labs Review Results for orders placed or performed during the hospital encounter of [id number]  Basic metabolic panel  Result Value Ref Range   Sodium 138 135 - 145 mmol/L   Potassium 4.9 3.5 - 5.1 mmol/L   Chloride 99 (L) 101 - 111 mmol/L   CO2 24 22 - 32 mmol/L   Glucose, Bld 105 (H) 70 - 99 mg/dL   BUN 65 (H) 6 - 20 mg/dL   Creatinine, Ser 11.82 (H) 0.44 - 1.00 mg/dL   Calcium 8.3 (L) 8.9 - 10.3 mg/dL   GFR calc non Af Amer 3 (L) >60 mL/min   GFR calc Af Amer 3 (L) >60 mL/min   Anion gap 15 5 - 15  CBC with Differential  Result Value Ref Range   WBC 10.6 (H) 4.0 - 10.5 K/uL   RBC 3.30 (L) 3.87 - 5.11 MIL/uL   Hemoglobin 10.5 (L) 12.0 - 15.0 g/dL   HCT 33.2 (L) 36.0 - 46.0 %   MCV 100.6 (H) 78.0 - 100.0 fL   MCH 31.8 26.0 - 34.0 pg   MCHC 31.6 30.0 - 36.0 g/dL   RDW 16.1 (H) 11.5 - 15.5 %   Platelets 282 150 - 400 K/uL   Neutrophils Relative % 79 (H) 43 - 77 %   Neutro Abs 8.4 (H) 1.7 - 7.7 K/uL   Lymphocytes Relative 9 (L) 12 - 46 %   Lymphs Abs 1.0 0.7 - 4.0 K/uL   Monocytes Relative 4 3 - 12 %   Monocytes Absolute 0.4 0.1 - 1.0 K/uL   Eosinophils Relative 7 (H) 0 - 5 %   Eosinophils Absolute 0.8 (H) 0.0 - 0.7 K/uL   Basophils Relative 1 0 - 1 %   Basophils Absolute 0.1 0.0 - 0.1 K/uL  Troponin I  Result Value Ref Range   Troponin I <0.03 <0.031 ng/mL   Troponin I  Result Value Ref Range   Troponin I <0.03 <0.031 ng/mL   Imaging Review Dg Chest Port 1 View  11/10/2014   CLINICAL DATA:  Dyspnea of the started around 20:00.  EXAM: PORTABLE CHEST - 1 VIEW  COMPARISON:  10/16/2014  FINDINGS: There is mild accentuation of basilar markings but this likely is due to the shallow inspiration. There is no confluent airspace consolidation. There are no large effusions. There is unchanged mild cardiomegaly. There is prior sternotomy and CABG.  IMPRESSION: Shallow inspiration with mild basilar crowding. No confluent airspace consolidation or large effusion.   Electronically Signed   By: Andreas Newport M.D.   On: 11/10/2014 04:15     EKG Interpretation   Date/Time:  Monday Nov 10 2014 02:54:05 EDT Ventricular Rate:  78 PR Interval:  161 QRS Duration: 103 QT Interval:  459 QTC Calculation: 523 R Axis:   44 Text Interpretation:  Sinus rhythm Probable LVH with secondary repol abnrm  Anterior Q waves, possibly due to LVH Prolonged QT interval When compared  with ECG of 10/16/2014, QT has lengthened ST depression in Anterolateral  leads is now Present T wave inversion in the Anterolateral leads is now  Present Confirmed by [facility]  MD, Jaquawn Saffran ([id number]) on 11/10/2014 3:13:17 AM      MDM   Final diagnoses:  Chest pain, unspecified chest pain type  End-stage renal disease on hemodialysis    Chest pain and dyspnea worrisome for cardiac event. ECG shows new ST and T changes. Review of past records shows non-STEMI in 2013 with stent placement and, later in the year, coronary artery bypass surgery. Further review of additional old ECG's shows no change relative to the ECG's prior to April 7.  Patient remained pain-free in the ED. Initial troponin is normal. It was elected to keep her in the ED to check a delta troponin which has come back also normal. I suspect that some of her discomfort is related to fluid overload. She is discharged with instructions to go  for her scheduled dialysis later today and follow-up with PCP.  Delora Fuel, [place] [id number]

## 2014-11-10 NOTE — ED Notes (Signed)
Pt informed of additional lab draw & then will be waiting for results.

## 2014-11-12 ENCOUNTER — Telehealth: Payer: Self-pay | Admitting: *Deleted

## 2014-11-12 ENCOUNTER — Ambulatory Visit (INDEPENDENT_AMBULATORY_CARE_PROVIDER_SITE_OTHER): Payer: Medicare Other | Admitting: *Deleted

## 2014-11-12 VITALS — BP 170/85 | HR 123

## 2014-11-12 DIAGNOSIS — I10 Essential (primary) hypertension: Secondary | ICD-10-CM | POA: Diagnosis not present

## 2014-11-12 NOTE — Telephone Encounter (Signed)
-----   Message from Herminio Commons, MD sent at 11/12/2014  4:05 PM EDT ----- Has had issues with low BP on dialysis days, thus other providers have reduced anti-HTN meds (nephrologist). HR normal by ECG. May have been due to volume fluctuations. Absence of chest pain/SOB encouraging. Would continue to monitor BP to see if meds need to be increased.   ----- Message -----    From: Merlene Laughter, LPN    Sent: [id number]   3:47 PM      To: Herminio Commons, MD

## 2014-11-12 NOTE — Progress Notes (Signed)
Patient walked into the office today after leaving dialysis with the c/o elevated BP 188/103 and HR 133 while at dialysis. No c/o of chest pain,sob or dizziness. Patient said she has taken all doses of her medications and didn't have any side effects. After leaving dialysis today patient said he took her clonidine. All medications reconciled during visit today. EKG done also and routed to Dr. Bronson Ing.

## 2014-11-12 NOTE — Telephone Encounter (Signed)
Patient informed via message machine. 

## 2014-12-02 ENCOUNTER — Encounter (INDEPENDENT_AMBULATORY_CARE_PROVIDER_SITE_OTHER): Payer: Self-pay | Admitting: *Deleted

## 2014-12-08 ENCOUNTER — Other Ambulatory Visit: Payer: Self-pay

## 2014-12-08 ENCOUNTER — Emergency Department (HOSPITAL_COMMUNITY)
Admission: EM | Admit: 2014-12-08 | Discharge: 2014-12-08 | Disposition: A | Discharge status: Home / Self Care | Payer: Medicare Other | Attending: Emergency Medicine | Admitting: Emergency Medicine

## 2014-12-08 ENCOUNTER — Emergency Department (HOSPITAL_COMMUNITY): Payer: Medicare Other

## 2014-12-08 ENCOUNTER — Encounter (HOSPITAL_COMMUNITY): Payer: Self-pay | Admitting: Emergency Medicine

## 2014-12-08 DIAGNOSIS — Z951 Presence of aortocoronary bypass graft: Secondary | ICD-10-CM | POA: Insufficient documentation

## 2014-12-08 DIAGNOSIS — Q2733 Arteriovenous malformation of digestive system vessel: Secondary | ICD-10-CM | POA: Insufficient documentation

## 2014-12-08 DIAGNOSIS — F419 Anxiety disorder, unspecified: Secondary | ICD-10-CM | POA: Diagnosis not present

## 2014-12-08 DIAGNOSIS — I120 Hypertensive chronic kidney disease with stage 5 chronic kidney disease or end stage renal disease: Secondary | ICD-10-CM | POA: Diagnosis not present

## 2014-12-08 DIAGNOSIS — R42 Dizziness and giddiness: Secondary | ICD-10-CM | POA: Diagnosis present

## 2014-12-08 DIAGNOSIS — M1990 Unspecified osteoarthritis, unspecified site: Secondary | ICD-10-CM | POA: Insufficient documentation

## 2014-12-08 DIAGNOSIS — Z8709 Personal history of other diseases of the respiratory system: Secondary | ICD-10-CM | POA: Insufficient documentation

## 2014-12-08 DIAGNOSIS — Z85038 Personal history of other malignant neoplasm of large intestine: Secondary | ICD-10-CM | POA: Diagnosis not present

## 2014-12-08 DIAGNOSIS — K219 Gastro-esophageal reflux disease without esophagitis: Secondary | ICD-10-CM | POA: Diagnosis not present

## 2014-12-08 DIAGNOSIS — Z7982 Long term (current) use of aspirin: Secondary | ICD-10-CM | POA: Insufficient documentation

## 2014-12-08 DIAGNOSIS — D509 Iron deficiency anemia, unspecified: Secondary | ICD-10-CM | POA: Diagnosis not present

## 2014-12-08 DIAGNOSIS — I252 Old myocardial infarction: Secondary | ICD-10-CM | POA: Diagnosis not present

## 2014-12-08 DIAGNOSIS — R531 Weakness: Secondary | ICD-10-CM | POA: Insufficient documentation

## 2014-12-08 DIAGNOSIS — Z79899 Other long term (current) drug therapy: Secondary | ICD-10-CM | POA: Insufficient documentation

## 2014-12-08 DIAGNOSIS — Z8543 Personal history of malignant neoplasm of ovary: Secondary | ICD-10-CM | POA: Insufficient documentation

## 2014-12-08 DIAGNOSIS — I2510 Atherosclerotic heart disease of native coronary artery without angina pectoris: Secondary | ICD-10-CM | POA: Diagnosis not present

## 2014-12-08 DIAGNOSIS — Z880 Allergy status to penicillin: Secondary | ICD-10-CM | POA: Diagnosis not present

## 2014-12-08 DIAGNOSIS — Z8711 Personal history of peptic ulcer disease: Secondary | ICD-10-CM | POA: Insufficient documentation

## 2014-12-08 DIAGNOSIS — Z8701 Personal history of pneumonia (recurrent): Secondary | ICD-10-CM | POA: Insufficient documentation

## 2014-12-08 DIAGNOSIS — I5032 Chronic diastolic (congestive) heart failure: Secondary | ICD-10-CM | POA: Insufficient documentation

## 2014-12-08 DIAGNOSIS — N186 End stage renal disease: Secondary | ICD-10-CM | POA: Insufficient documentation

## 2014-12-08 DIAGNOSIS — I10 Essential (primary) hypertension: Secondary | ICD-10-CM

## 2014-12-08 DIAGNOSIS — E780 Pure hypercholesterolemia: Secondary | ICD-10-CM | POA: Diagnosis not present

## 2014-12-08 DIAGNOSIS — Z8673 Personal history of transient ischemic attack (TIA), and cerebral infarction without residual deficits: Secondary | ICD-10-CM | POA: Diagnosis not present

## 2014-12-08 LAB — CBC
HEMATOCRIT: 31.8 % — AB (ref 36.0–46.0)
Hemoglobin: 10.1 g/dL — ABNORMAL LOW (ref 12.0–15.0)
MCH: 31.2 pg (ref 26.0–34.0)
MCHC: 31.8 g/dL (ref 30.0–36.0)
MCV: 98.1 fL (ref 78.0–100.0)
Platelets: 218 10*3/uL (ref 150–400)
RBC: 3.24 MIL/uL — ABNORMAL LOW (ref 3.87–5.11)
RDW: 16.3 % — ABNORMAL HIGH (ref 11.5–15.5)
WBC: 5.4 10*3/uL (ref 4.0–10.5)

## 2014-12-08 LAB — BASIC METABOLIC PANEL
Anion gap: 12 (ref 5–15)
BUN: 23 mg/dL — ABNORMAL HIGH (ref 6–20)
CALCIUM: 9.2 mg/dL (ref 8.9–10.3)
CO2: 28 mmol/L (ref 22–32)
CREATININE: 5.99 mg/dL — AB (ref 0.44–1.00)
Chloride: 96 mmol/L — ABNORMAL LOW (ref 101–111)
GFR calc non Af Amer: 6 mL/min — ABNORMAL LOW (ref >60)
GFR, EST AFRICAN AMERICAN: 7 mL/min — AB (ref >60)
Glucose, Bld: 163 mg/dL — ABNORMAL HIGH (ref 65–99)
Potassium: 3.3 mmol/L — ABNORMAL LOW (ref 3.5–5.1)
SODIUM: 136 mmol/L (ref 135–145)

## 2014-12-08 IMAGING — CR DG CHEST 1V PORT
1 series · 1 of 1 positions shown · non-contrast
Comparison: [DATE]

CLINICAL DATA: Dizziness after dialysis

EXAM:
PORTABLE CHEST - 1 VIEW

[ap portable]
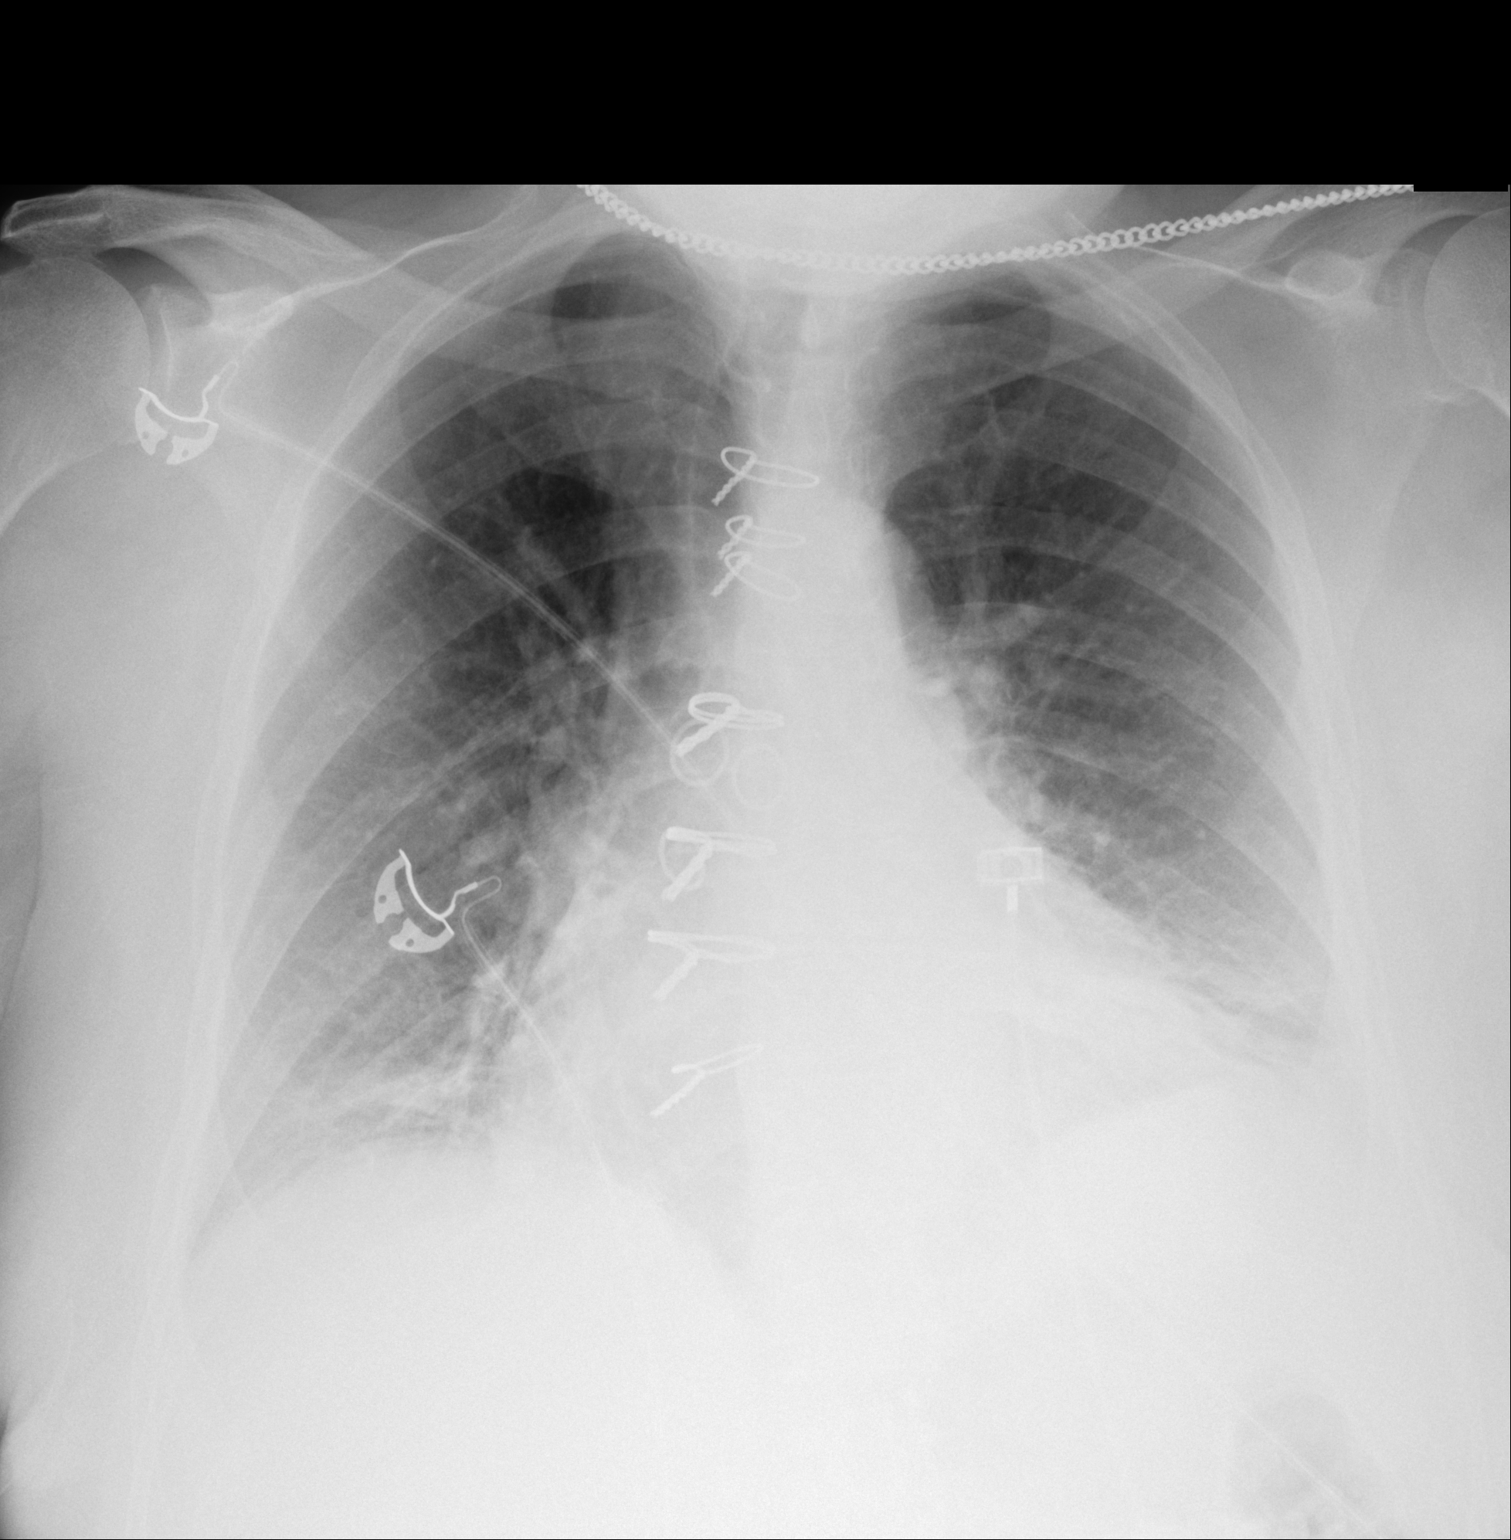

[1 of 1 positions shown; findings below may reference images not displayed]

FINDINGS: Patchy bilateral lower lobe opacities, likely atelectasis. No focal
consolidation or frank interstitial edema. No pleural effusion or
pneumothorax.

The heart is top-normal in size/ mildly enlarged. Postsurgical
changes related to prior CABG.
IMPRESSION: Patchy bilateral lower lobe opacities, likely atelectasis.

## 2014-12-08 NOTE — ED Provider Notes (Signed)
CSN: [id number]     Arrival date & time 12/08/14  1546 History   First MD Initiated Contact with Patient 12/08/14 1601     Chief Complaint  Patient presents with  . Dizziness     (Consider location/radiation/quality/duration/timing/severity/associated sxs/prior Treatment) Patient is a 74 y.o. female presenting with dizziness. The history is provided by the patient (the pt had dialysis today and has felt dizzy.  pt has missed her mid day bp med).  Dizziness Quality:  Lightheadedness Severity:  Mild Onset quality:  Gradual Timing:  Intermittent Progression:  Waxing and waning Chronicity:  Recurrent Context: not with head movement   Associated symptoms: no chest pain, no diarrhea and no headaches     Past Medical History  Diagnosis Date  . Chronic bronchitis   . GERD (gastroesophageal reflux disease)   . PUD (peptic ulcer disease)   . History of lower GI bleeding   . Arthritis   . History of gout   . CAD (coronary artery disease)     a. 12/2011 NSTEMI/Cath/PCI LCX (2.25x14 Resolute DES) & D1 (2.25x22 Resolute DES);  b. 01/2012 Cath/PCI: LM 30, LAD 30p, 40-61m, D1 stent ok, 99 in sm branch of diag, LCX patent stent, OM1 20, RCA 95 ost (4.0x12 Promus DES), EF 55%;  c. 04/2012 Lexi Cardiolite  EF 48%, small area of scar @ base/mid inflat wall with mild peri-infarct ischemia.; CABG 12/4  . High cholesterol 12/2011  . Pneumonia ~ 2009  . Iron deficiency anemia   . TIA (transient ischemic attack)   . Anxiety   . History of blood transfusion 07/2011; 12/2011; 01/2012 X 2; 04/2012  . Carotid artery disease     a. [id number] LICA, [id number]   . Mitral regurgitation     a. Moderate by echo, 02/2012  . Myocardial infarction   . Chronic diastolic CHF (congestive heart failure)     a. 02/2012 Echo EF 60-65%, nl wall motion, Gr 1 DD, mod MR  . Hypertension   . AVM (arteriovenous malformation) of colon   . Esophageal stricture   . Ovarian cancer 1992  . Colon cancer 1992  . ESRD on hemodialysis      ESRD due to HTN, started dialysis 2011 and gets HD at [facility] with Dr Hinda Lenis on MWF schedule.  Access is LUA AVF as of Sept 2014.    Past Surgical History  Procedure Laterality Date  . Abdominal hysterectomy  1992  . Appendectomy  06/1990  . Tubal ligation  1980's  . Av fistula placement  07/2009    left upper arm  . Thrombectomy / arteriovenous graft revision  2011    left upper arm  . Colon resection  1992  . Esophagogastroduodenoscopy  01/20/2012    Procedure: ESOPHAGOGASTRODUODENOSCOPY (EGD);  Surgeon: Ladene Artist, MD,FACG;  Location: [facility] ENDOSCOPY;  Service: Endoscopy;  Laterality: N/A;  . Dilation and curettage of uterus    . Coronary angioplasty with stent placement  12/15/11    "2"  . Coronary angioplasty with stent placement  y/2013    "1; makes total of 3" (05/02/2012)  . Coronary artery bypass graft  06/13/2012    Procedure: CORONARY ARTERY BYPASS GRAFTING (CABG);  Surgeon: Grace Isaac, MD;  Location: [facility];  Service: Open Heart Surgery;  Laterality: N/A;  cabg x four;  using left internal mammary artery, and left leg greater saphenous vein harvested endoscopically  . Intraoperative transesophageal echocardiogram  06/13/2012    Procedure: INTRAOPERATIVE TRANSESOPHAGEAL ECHOCARDIOGRAM;  Surgeon: Percell Miller  Maryruth Bun, MD;  Location: [place];  Service: Open Heart Surgery;  Laterality: N/A;  . Esophagogastroduodenoscopy N/A 03/26/2013    Procedure: ESOPHAGOGASTRODUODENOSCOPY (EGD);  Surgeon: Irene Shipper, MD;  Location: [facility] ENDOSCOPY;  Service: Endoscopy;  Laterality: N/A;  . Ovary surgery      ovarian cancer  . Cardiac surgery    . Left heart catheterization with coronary angiogram N/A 12/15/2011    Procedure: LEFT HEART CATHETERIZATION WITH CORONARY ANGIOGRAM;  Surgeon: Burnell Blanks, MD;  Location: [facility] CATH LAB;  Service: Cardiovascular;  Laterality: N/A;  . Left heart catheterization with coronary angiogram N/A 01/10/2012    Procedure: LEFT HEART CATHETERIZATION WITH  CORONARY ANGIOGRAM;  Surgeon: Peter M Martinique, MD;  Location: [facility] CATH LAB;  Service: Cardiovascular;  Laterality: N/A;  . Left heart catheterization with coronary angiogram N/A 06/08/2012    Procedure: LEFT HEART CATHETERIZATION WITH CORONARY ANGIOGRAM;  Surgeon: Burnell Blanks, MD;  Location: [facility] CATH LAB;  Service: Cardiovascular;  Laterality: N/A;  . Shuntogram N/A 10/15/2013    Procedure: Fistulogram;  Surgeon: Serafina Mitchell, MD;  Location: [facility] CATH LAB;  Service: Cardiovascular;  Laterality: N/A;  . Left heart catheterization with coronary/graft angiogram N/A 12/10/2013    Procedure: LEFT HEART CATHETERIZATION WITH Beatrix Fetters;  Surgeon: [name], MD;  Location: [facility] CATH LAB;  Service: Cardiovascular;  Laterality: N/A;   Family History  Problem Relation Age of Onset  . Other      noncontributory for early CAD  . Heart disease Mother     Heart Disease before age 66  . Hyperlipidemia Mother   . Hypertension Mother   . Diabetes Mother   . Heart attack Mother   . Heart disease Father     Heart Disease before age 9  . Hyperlipidemia Father   . Hypertension Father   . Diabetes Father   . Diabetes Sister   . Hypertension Sister   . Diabetes Brother   . Hyperlipidemia Brother   . Heart attack Brother   . Colon cancer Neg Hx   . Esophageal cancer Neg Hx   . Liver disease Neg Hx   . Kidney disease Neg Hx   . Colon polyps Neg Hx   . Hypertension Sister   . Heart attack Brother    History  Substance Use Topics  . Smoking status: Never Smoker   . Smokeless tobacco: Never Used  . Alcohol Use: No   OB History    No data available     Review of Systems  Constitutional: Negative for appetite change and fatigue.  HENT: Negative for congestion, ear discharge and sinus pressure.   Eyes: Negative for discharge.  Respiratory: Negative for cough.   Cardiovascular: Negative for chest pain.  Gastrointestinal: Negative for abdominal pain and diarrhea.   Genitourinary: Negative for frequency and hematuria.  Musculoskeletal: Negative for back pain.  Skin: Negative for rash.  Neurological: Positive for dizziness. Negative for seizures and headaches.  Psychiatric/Behavioral: Negative for hallucinations.      Allergies  Aspirin; Contrast media; Iron; Macrodantin; Penicillins; Plavix; Bactrim; Sulfa antibiotics; Venofer; Dexilant; Morphine and related; Prilosec; Levaquin; and Protonix  Home Medications   Prior to Admission medications   Medication Sig Start Date End Date Taking? Authorizing Provider  ALPRAZolam Duanne Moron) 0.5 MG tablet Take 0.5 mg by mouth 3 (three) times daily.   Yes Historical Provider, MD  amLODipine (NORVASC) 10 MG tablet Take 1 tablet (10 mg total) by mouth daily. 12/17/13  Yes Phill Myron  Lawrence, NP  aspirin EC 81 MG tablet Take 81 mg by mouth daily.   Yes Historical Provider, MD  cloNIDine (CATAPRES) 0.2 MG tablet Take 1 tablet (0.2 mg total) by mouth 2 (two) times daily. 12/23/13  Yes Fay Records, MD  dextromethorphan-guaiFENesin [facility] DM) 30-600 MG per 12 hr tablet Take 1 tablet by mouth daily. Patient taking differently: Take 1 tablet by mouth 3 (three) times daily.  09/29/14  Yes Fredia Sorrow, MD  fluticasone (FLONASE) 50 MCG/ACT nasal spray Place 2 sprays into the nose daily as needed for allergies.    Yes Historical Provider, MD  folic acid-vitamin b complex-vitamin c-selenium-zinc (DIALYVITE) 3 MG TABS Take 1 tablet by mouth daily.   Yes Historical Provider, MD  hydrALAZINE (APRESOLINE) 25 MG tablet Take 1 tablet (25 mg total) by mouth 3 (three) times daily. 09/16/14  Yes Herminio Commons, MD  isosorbide dinitrate (ISORDIL) 30 MG tablet Take 1 tablet (30 mg total) by mouth 3 (three) times daily. 10/02/14  Yes Erline Hau, MD  labetalol (NORMODYNE) 200 MG tablet Take 200 mg by mouth 2 (two) times daily.   Yes Historical Provider, MD  lanthanum (FOSRENOL) 1000 MG chewable tablet Chew 1,000 mg by  mouth 3 (three) times daily after meals.   Yes Historical Provider, MD  lidocaine-prilocaine (EMLA) cream Apply 1 application topically daily as needed (fo access).  11/29/14  Yes Historical Provider, MD  loratadine (CLARITIN) 10 MG tablet Take 10 mg by mouth daily.    Yes Historical Provider, MD  nitroGLYCERIN (NITROSTAT) 0.4 MG SL tablet Place 1 tablet (0.4 mg total) under the tongue every 5 (five) minutes x 3 doses as needed for chest pain. 06/23/14  Yes Herminio Commons, MD  omeprazole (PRILOSEC) 20 MG capsule Take 1 capsule (20 mg total) by mouth daily. 02/10/14  Yes Ladene Artist, MD  polyethylene glycol [facility] Floria Raveling) packet Take 17 g by mouth daily. Patient taking differently: Take 17 g by mouth daily as needed for mild constipation or moderate constipation.  10/02/14  Yes Estela Leonie Green, MD  SENSIPAR 30 MG tablet Take 30 mg by mouth daily with supper.  12/21/12  Yes Historical Provider, MD  simvastatin (ZOCOR) 20 MG tablet Take 1 tablet (20 mg total) by mouth at bedtime. 11/04/14  Yes Herminio Commons, MD  tetrahydrozoline 0.05 % ophthalmic solution Place 1 drop into both eyes 2 (two) times daily as needed (irritation).   Yes Historical Provider, MD  acetaminophen (TYLENOL) 325 MG tablet Take 2 tablets (650 mg total) by mouth every 6 (six) hours as needed for mild pain (mild pain). 10/02/14   Erline Hau, MD  benzonatate (TESSALON PERLES) 100 MG capsule Take 1 capsule (100 mg total) by mouth 3 (three) times daily as needed for cough. Patient not taking: Reported on 12/08/2014 10/02/14   Erline Hau, MD  fluconazole (DIFLUCAN) 150 MG tablet Take 1 tablet (150 mg total) by mouth once. Patient not taking: Reported on 12/08/2014 10/02/14   Erline Hau, MD   BP 211/78 mmHg  Pulse 75  Temp(Src) 98.1 F (36.7 C) (Oral)  Resp 20  Ht 5\' 1"  (1.549 m)  Wt 145 lb (65.772 kg)  BMI 27.41 kg/m2  SpO2 96% Physical Exam  Constitutional: She is  oriented to person, place, and time. She appears well-developed.  HENT:  Head: Normocephalic.  Eyes: Conjunctivae and EOM are normal. No scleral icterus.  Neck: Neck supple. No  thyromegaly present.  Cardiovascular: Normal rate and regular rhythm.  Exam reveals no gallop and no friction rub.   No murmur heard. Pulmonary/Chest: No stridor. She has no wheezes. She has no rales. She exhibits no tenderness.  Abdominal: She exhibits no distension. There is no tenderness. There is no rebound.  Musculoskeletal: Normal range of motion. She exhibits no edema.  Lymphadenopathy:    She has no cervical adenopathy.  Neurological: She is oriented to person, place, and time. She exhibits normal muscle tone. Coordination normal.  Skin: No rash noted. No erythema.  Psychiatric: She has a normal mood and affect. Her behavior is normal.    ED Course  Procedures (including critical care time) Labs Review Labs Reviewed  CBC - Abnormal; Notable for the following:    RBC 3.24 (*)    Hemoglobin 10.1 (*)    HCT 31.8 (*)    RDW 16.3 (*)    All other components within normal limits  BASIC METABOLIC PANEL - Abnormal; Notable for the following:    Potassium 3.3 (*)    Chloride 96 (*)    Glucose, Bld 163 (*)    BUN 23 (*)    Creatinine, Ser 5.99 (*)    GFR calc non Af Amer 6 (*)    GFR calc Af Amer 7 (*)    All other components within normal limits    Imaging Review Dg Chest Portable 1 View  12/08/2014   CLINICAL DATA:  Dizziness after dialysis  EXAM: PORTABLE CHEST - 1 VIEW  COMPARISON:  11/10/2014  FINDINGS: Patchy bilateral lower lobe opacities, likely atelectasis. No focal consolidation or frank interstitial edema. No pleural effusion or pneumothorax.  The heart is top-normal in size/ mildly enlarged. Postsurgical changes related to prior CABG.  IMPRESSION: Patchy bilateral lower lobe opacities, likely atelectasis.   Electronically Signed   By: Julian Hy M.D.   On: 12/08/2014 16:41     EKG  Interpretation   Date/Time:  Monday Dec 08 2014 16:00:39 EDT Ventricular Rate:  74 PR Interval:  165 QRS Duration: 106 QT Interval:  443 QTC Calculation: 491 R Axis:   -7 Text Interpretation:  Sinus rhythm LVH with secondary repolarization  abnormality Borderline prolonged QT interval Confirmed by Trevyon Swor  MD,  Armanii Urbanik [phone]) on 12/08/2014 4:14:11 PM      MDM   Final diagnoses:  Weakness  Essential hypertension    Dizziness,   Htn,  Dialysis.  Pr to take her mid day bp med and follow up with her md this week    Milton Ferguson, MD 12/08/14 [id number]

## 2014-12-08 NOTE — ED Notes (Signed)
Pt states that she finished her dialysis treatment about an hour ago and when she went to get up felt very dizzy like she was going to pass out.  States that she just hasn't felt well since finishing today.

## 2014-12-08 NOTE — Discharge Instructions (Signed)
Follow up with your md this week.  Take your bp medicine when you get home

## 2014-12-13 ENCOUNTER — Emergency Department (HOSPITAL_COMMUNITY)
Admission: EM | Admit: 2014-12-13 | Discharge: 2014-12-13 | Disposition: A | Discharge status: Home / Self Care | Payer: Medicare Other | Attending: Emergency Medicine | Admitting: Emergency Medicine

## 2014-12-13 ENCOUNTER — Encounter (HOSPITAL_COMMUNITY): Payer: Self-pay | Admitting: *Deleted

## 2014-12-13 ENCOUNTER — Emergency Department (HOSPITAL_COMMUNITY): Payer: Medicare Other

## 2014-12-13 DIAGNOSIS — I2510 Atherosclerotic heart disease of native coronary artery without angina pectoris: Secondary | ICD-10-CM | POA: Insufficient documentation

## 2014-12-13 DIAGNOSIS — Z8701 Personal history of pneumonia (recurrent): Secondary | ICD-10-CM | POA: Insufficient documentation

## 2014-12-13 DIAGNOSIS — Z951 Presence of aortocoronary bypass graft: Secondary | ICD-10-CM | POA: Diagnosis not present

## 2014-12-13 DIAGNOSIS — Z85038 Personal history of other malignant neoplasm of large intestine: Secondary | ICD-10-CM | POA: Diagnosis not present

## 2014-12-13 DIAGNOSIS — K219 Gastro-esophageal reflux disease without esophagitis: Secondary | ICD-10-CM | POA: Insufficient documentation

## 2014-12-13 DIAGNOSIS — Z8711 Personal history of peptic ulcer disease: Secondary | ICD-10-CM | POA: Insufficient documentation

## 2014-12-13 DIAGNOSIS — Z992 Dependence on renal dialysis: Secondary | ICD-10-CM | POA: Diagnosis not present

## 2014-12-13 DIAGNOSIS — E780 Pure hypercholesterolemia: Secondary | ICD-10-CM | POA: Insufficient documentation

## 2014-12-13 DIAGNOSIS — Z8543 Personal history of malignant neoplasm of ovary: Secondary | ICD-10-CM | POA: Diagnosis not present

## 2014-12-13 DIAGNOSIS — Z7951 Long term (current) use of inhaled steroids: Secondary | ICD-10-CM | POA: Insufficient documentation

## 2014-12-13 DIAGNOSIS — I120 Hypertensive chronic kidney disease with stage 5 chronic kidney disease or end stage renal disease: Secondary | ICD-10-CM | POA: Diagnosis not present

## 2014-12-13 DIAGNOSIS — Z7982 Long term (current) use of aspirin: Secondary | ICD-10-CM | POA: Diagnosis not present

## 2014-12-13 DIAGNOSIS — M1990 Unspecified osteoarthritis, unspecified site: Secondary | ICD-10-CM | POA: Diagnosis not present

## 2014-12-13 DIAGNOSIS — F419 Anxiety disorder, unspecified: Secondary | ICD-10-CM | POA: Insufficient documentation

## 2014-12-13 DIAGNOSIS — Z9889 Other specified postprocedural states: Secondary | ICD-10-CM | POA: Diagnosis not present

## 2014-12-13 DIAGNOSIS — Z79899 Other long term (current) drug therapy: Secondary | ICD-10-CM | POA: Diagnosis not present

## 2014-12-13 DIAGNOSIS — Z880 Allergy status to penicillin: Secondary | ICD-10-CM | POA: Insufficient documentation

## 2014-12-13 DIAGNOSIS — I5032 Chronic diastolic (congestive) heart failure: Secondary | ICD-10-CM | POA: Diagnosis not present

## 2014-12-13 DIAGNOSIS — N186 End stage renal disease: Secondary | ICD-10-CM | POA: Diagnosis not present

## 2014-12-13 DIAGNOSIS — I252 Old myocardial infarction: Secondary | ICD-10-CM | POA: Insufficient documentation

## 2014-12-13 DIAGNOSIS — R12 Heartburn: Secondary | ICD-10-CM

## 2014-12-13 DIAGNOSIS — Z8673 Personal history of transient ischemic attack (TIA), and cerebral infarction without residual deficits: Secondary | ICD-10-CM | POA: Insufficient documentation

## 2014-12-13 DIAGNOSIS — M25512 Pain in left shoulder: Secondary | ICD-10-CM | POA: Diagnosis present

## 2014-12-13 LAB — CBC
HCT: 33.4 % — ABNORMAL LOW (ref 36.0–46.0)
Hemoglobin: 10.5 g/dL — ABNORMAL LOW (ref 12.0–15.0)
MCH: 30.8 pg (ref 26.0–34.0)
MCHC: 31.4 g/dL (ref 30.0–36.0)
MCV: 97.9 fL (ref 78.0–100.0)
Platelets: 251 10*3/uL (ref 150–400)
RBC: 3.41 MIL/uL — ABNORMAL LOW (ref 3.87–5.11)
RDW: 16.2 % — ABNORMAL HIGH (ref 11.5–15.5)
WBC: 7.3 10*3/uL (ref 4.0–10.5)

## 2014-12-13 LAB — COMPREHENSIVE METABOLIC PANEL
ALBUMIN: 3.6 g/dL (ref 3.5–5.0)
ALK PHOS: 83 U/L (ref 38–126)
ALT: 13 U/L — AB (ref 14–54)
AST: 19 U/L (ref 15–41)
Anion gap: 12 (ref 5–15)
BILIRUBIN TOTAL: 1 mg/dL (ref 0.3–1.2)
BUN: 21 mg/dL — ABNORMAL HIGH (ref 6–20)
CALCIUM: 9.5 mg/dL (ref 8.9–10.3)
CHLORIDE: 99 mmol/L — AB (ref 101–111)
CO2: 28 mmol/L (ref 22–32)
CREATININE: 6.79 mg/dL — AB (ref 0.44–1.00)
GFR calc Af Amer: 6 mL/min — ABNORMAL LOW (ref >60)
GFR calc non Af Amer: 5 mL/min — ABNORMAL LOW (ref >60)
Glucose, Bld: 110 mg/dL — ABNORMAL HIGH (ref 65–99)
Potassium: 4 mmol/L (ref 3.5–5.1)
Sodium: 139 mmol/L (ref 135–145)
Total Protein: 6 g/dL — ABNORMAL LOW (ref 6.5–8.1)

## 2014-12-13 LAB — I-STAT TROPONIN, ED
Troponin i, poc: 0.02 ng/mL (ref 0.00–0.08)
Troponin i, poc: 0.03 ng/mL (ref 0.00–0.08)

## 2014-12-13 IMAGING — CR DG CHEST 1V PORT
1 series · 1 of 1 positions shown · non-contrast
Comparison: One-view chest [DATE].

CLINICAL DATA: Left shoulder pain upon waking.

EXAM:
PORTABLE CHEST - 1 VIEW

[ap portable]
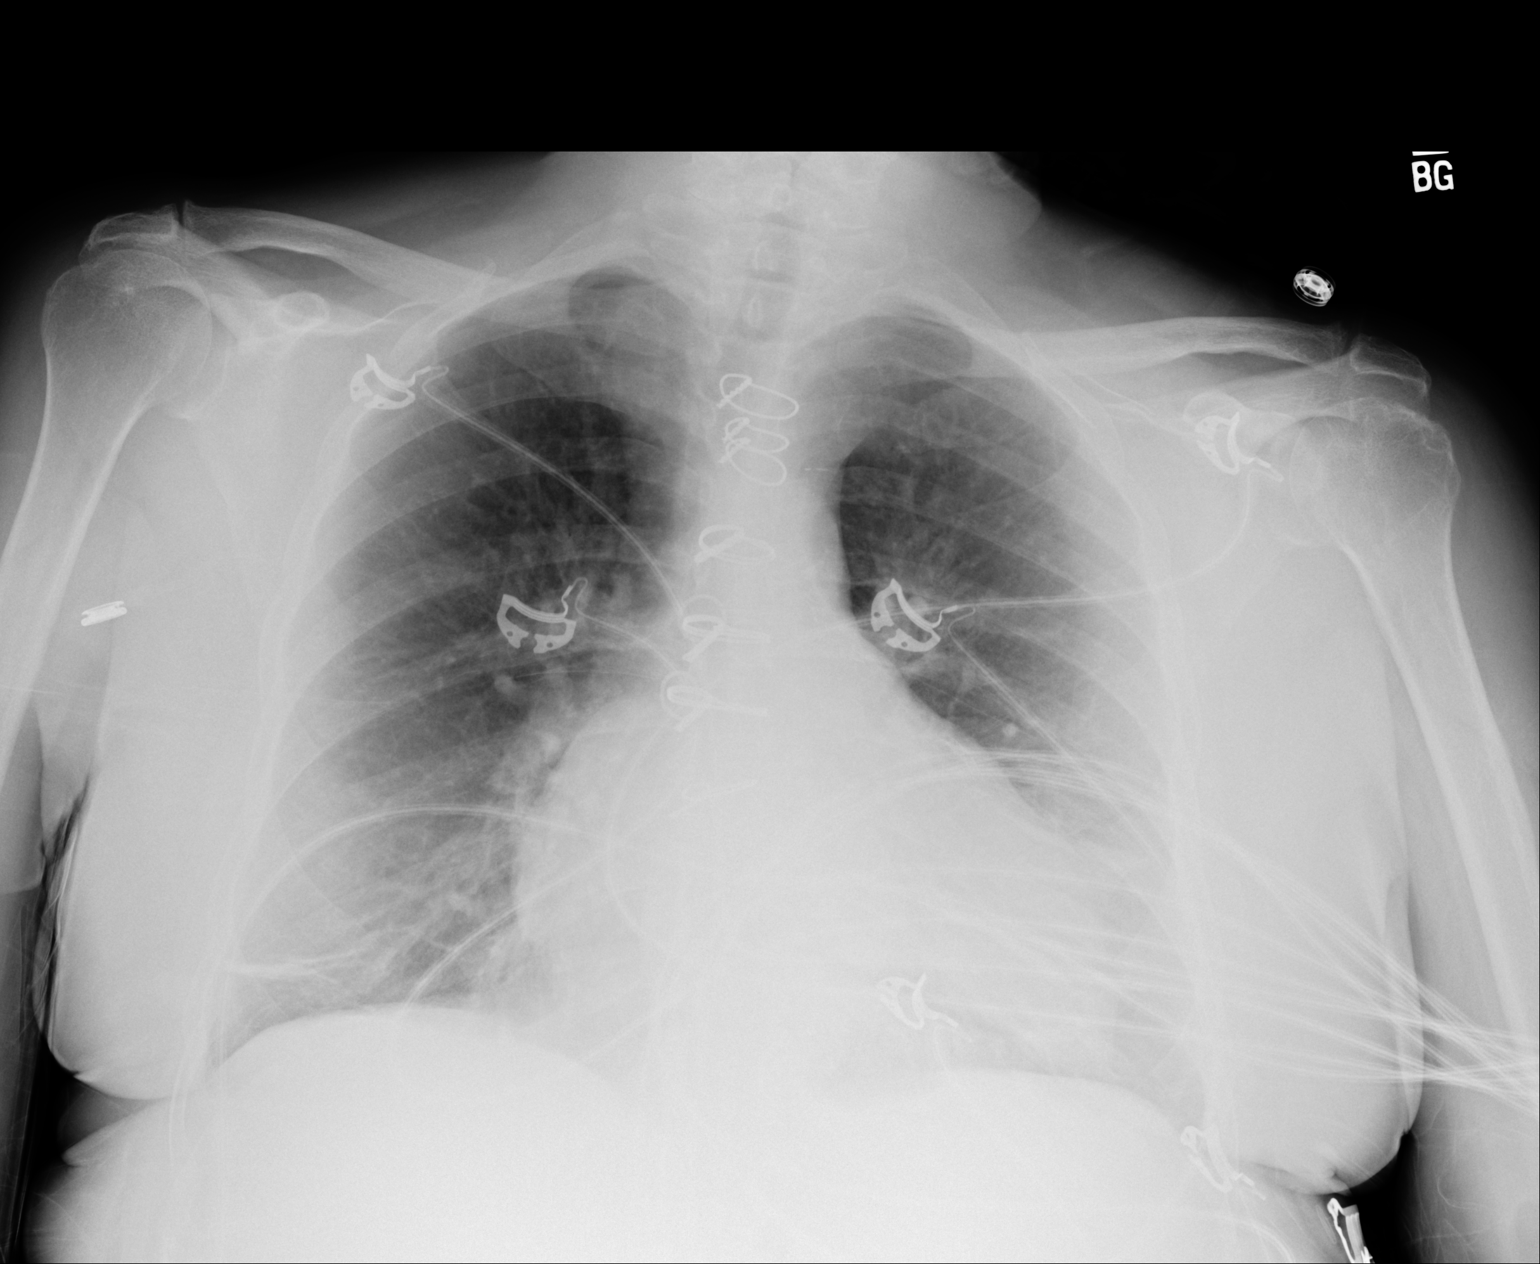

[1 of 1 positions shown; findings below may reference images not displayed]

FINDINGS: The heart is mildly enlarged. Median sternotomy is noted. There is
no edema or effusion to suggest failure. Linear scarring or
atelectasis is present at the right lung base. No focal airspace
disease is present otherwise. Asymmetric degenerative changes are
noted in the left shoulder.
IMPRESSION: 1. Stable cardiomegaly without failure.
2. Linear atelectasis versus scarring at the right lung base.
3. Asymmetric left shoulder degenerative change.

## 2014-12-13 MED ORDER — HYDRALAZINE HCL 25 MG PO TABS
25.0000 mg | ORAL_TABLET | Freq: Once | ORAL | Status: AC
  Administered 2014-12-13: 25 mg via ORAL
  Filled 2014-12-13: qty 1

## 2014-12-13 MED ORDER — ASPIRIN 81 MG PO CHEW
162.0000 mg | CHEWABLE_TABLET | Freq: Once | ORAL | Status: AC
  Administered 2014-12-13: 162 mg via ORAL
  Filled 2014-12-13: qty 2

## 2014-12-13 MED ORDER — CLONIDINE HCL 0.2 MG PO TABS
ORAL_TABLET | ORAL | Status: AC
  Administered 2014-12-13: 0.2 mg via ORAL
  Filled 2014-12-13: qty 1

## 2014-12-13 MED ORDER — GI COCKTAIL ~~LOC~~
30.0000 mL | Freq: Once | ORAL | Status: AC
Start: 2014-12-13 — End: 2014-12-13
  Administered 2014-12-13: 30 mL via ORAL
  Filled 2014-12-13: qty 30

## 2014-12-13 MED ORDER — CLONIDINE HCL 0.2 MG PO TABS
0.2000 mg | ORAL_TABLET | Freq: Once | ORAL | Status: AC
  Administered 2014-12-13: 0.2 mg via ORAL

## 2014-12-13 NOTE — Discharge Instructions (Signed)
Your symptoms do not appear to be caused from your heart. The most likely thing is acid from your stomach, causing inflammation in your esophagus. To treat this; use Maalox (30 cc) before meals and at bedtime for 2 weeks. See your doctor for checkup in one week to decide if this treatment is working, or if you need to be changed to a new treatment protocol.    Gastroesophageal Reflux Disease, Adult Gastroesophageal reflux disease (GERD) happens when acid from your stomach goes into your food pipe (esophagus). The acid can cause a burning feeling in your chest. Over time, the acid can make small holes (ulcers) in your food pipe.  HOME CARE  Ask your doctor for advice about:  Losing weight.  Quitting smoking.  Alcohol use.  Avoid foods and drinks that make your problems worse. You may want to avoid:  Caffeine and alcohol.  Chocolate.  Mints.  Garlic and onions.  Spicy foods.  Citrus fruits, such as oranges, lemons, or limes.  Foods that contain tomato, such as sauce, chili, salsa, and pizza.  Fried and fatty foods.  Avoid lying down for 3 hours before you go to bed or before you take a nap.  Eat small meals often, instead of large meals.  Wear loose-fitting clothing. Do not wear anything tight around your waist.  Raise (elevate) the head of your bed 6 to 8 inches with wood blocks. Using extra pillows does not help.  Only take medicines as told by your doctor.  Do not take aspirin or ibuprofen. GET HELP RIGHT AWAY IF:   You have pain in your arms, neck, jaw, teeth, or back.  Your pain gets worse or changes.  You feel sick to your stomach (nauseous), throw up (vomit), or sweat (diaphoresis).  You feel short of breath, or you pass out (faint).  Your throw up is green, yellow, black, or looks like coffee grounds or blood.  Your poop (stool) is red, bloody, or black. MAKE SURE YOU:   Understand these instructions.  Will watch your condition.  Will get help  right away if you are not doing well or get worse. Document Released: 12/14/2007 Document Revised: 09/19/2011 Document Reviewed: 01/14/2011 [facility] Patient Information 2015 [place], [place]. This information is not intended to replace advice given to you by your health care provider. Make sure you discuss any questions you have with your health care provider.

## 2014-12-13 NOTE — ED Provider Notes (Addendum)
CSN: [id number]     Arrival date & time 12/13/14  [id number] History   First MD Initiated Contact with Patient 12/13/14 [id number]     Chief Complaint  Patient presents with  . Shoulder Pain     (Consider location/radiation/quality/duration/timing/severity/associated sxs/prior Treatment) HPI   Shawna [name] is a 74 y.o. female who was awakened by left upper chest discomfort which radiated to her left shoulder at 7 AM this morning. The pain was severe, 10 over 10, and tell, she took 2 baby aspirin, and one sublingual nitroglycerin. After about 10 minutes. She had improvement of her pain to 2/10. She had several episodes of diarrhea over the preceding several days and feels like her upper abdomen has been uncomfortable for about one week. She has continued to take her omeprazole. She was dialyzed yesterday, as usual. She was in the emergency department 12/08/2014, at that time had dizziness and was discharged after evaluation, to take her usual medications. She did not eat yet this morning. She states that the dizziness has persisted, and describes it as a problem with balance, while walking. She does not have dizziness while at rest. This dizzy symptom has been present for about one week. She sees her cardiologist regularly, last 3 months ago. She's had a mild headache recently. She had some shortness of breath while having the chest pain earlier today. She was not diaphoretic. She has not had fever, chills or cough. There are no other known modifying factors.   Past Medical History  Diagnosis Date  . Chronic bronchitis   . GERD (gastroesophageal reflux disease)   . PUD (peptic ulcer disease)   . History of lower GI bleeding   . Arthritis   . History of gout   . CAD (coronary artery disease)     a. 12/2011 NSTEMI/Cath/PCI LCX (2.25x14 Resolute DES) & D1 (2.25x22 Resolute DES);  b. 01/2012 Cath/PCI: LM 30, LAD 30p, 40-29m, D1 stent ok, 99 in sm branch of diag, LCX patent stent, OM1 20, RCA 95 ost (4.0x12  Promus DES), EF 55%;  c. 04/2012 Lexi Cardiolite  EF 48%, small area of scar @ base/mid inflat wall with mild peri-infarct ischemia.; CABG 12/4  . High cholesterol 12/2011  . Pneumonia ~ 2009  . Iron deficiency anemia   . TIA (transient ischemic attack)   . Anxiety   . History of blood transfusion 07/2011; 12/2011; 01/2012 X 2; 04/2012  . Carotid artery disease     a. [id number] LICA, [id number]   . Mitral regurgitation     a. Moderate by echo, 02/2012  . Myocardial infarction   . Chronic diastolic CHF (congestive heart failure)     a. 02/2012 Echo EF 60-65%, nl wall motion, Gr 1 DD, mod MR  . Hypertension   . AVM (arteriovenous malformation) of colon   . Esophageal stricture   . Ovarian cancer 1992  . Colon cancer 1992  . ESRD on hemodialysis     ESRD due to HTN, started dialysis 2011 and gets HD at [facility] with Dr Hinda Lenis on MWF schedule.  Access is LUA AVF as of Sept 2014.    Past Surgical History  Procedure Laterality Date  . Abdominal hysterectomy  1992  . Appendectomy  06/1990  . Tubal ligation  1980's  . Av fistula placement  07/2009    left upper arm  . Thrombectomy / arteriovenous graft revision  2011    left upper arm  . Colon resection  1992  . Esophagogastroduodenoscopy  01/20/2012    Procedure: ESOPHAGOGASTRODUODENOSCOPY (EGD);  Surgeon: Ladene Artist, MD,FACG;  Location: [facility] ENDOSCOPY;  Service: Endoscopy;  Laterality: N/A;  . Dilation and curettage of uterus    . Coronary angioplasty with stent placement  12/15/11    "2"  . Coronary angioplasty with stent placement  y/2013    "1; makes total of 3" (05/02/2012)  . Coronary artery bypass graft  06/13/2012    Procedure: CORONARY ARTERY BYPASS GRAFTING (CABG);  Surgeon: Grace Isaac, MD;  Location: [place];  Service: Open Heart Surgery;  Laterality: N/A;  cabg x four;  using left internal mammary artery, and left leg greater saphenous vein harvested endoscopically  . Intraoperative transesophageal echocardiogram  06/13/2012     Procedure: INTRAOPERATIVE TRANSESOPHAGEAL ECHOCARDIOGRAM;  Surgeon: Grace Isaac, MD;  Location: [place];  Service: Open Heart Surgery;  Laterality: N/A;  . Esophagogastroduodenoscopy N/A 03/26/2013    Procedure: ESOPHAGOGASTRODUODENOSCOPY (EGD);  Surgeon: Irene Shipper, MD;  Location: [facility] ENDOSCOPY;  Service: Endoscopy;  Laterality: N/A;  . Ovary surgery      ovarian cancer  . Cardiac surgery    . Left heart catheterization with coronary angiogram N/A 12/15/2011    Procedure: LEFT HEART CATHETERIZATION WITH CORONARY ANGIOGRAM;  Surgeon: Burnell Blanks, MD;  Location: [facility] CATH LAB;  Service: Cardiovascular;  Laterality: N/A;  . Left heart catheterization with coronary angiogram N/A 01/10/2012    Procedure: LEFT HEART CATHETERIZATION WITH CORONARY ANGIOGRAM;  Surgeon: Peter M Martinique, MD;  Location: [facility] CATH LAB;  Service: Cardiovascular;  Laterality: N/A;  . Left heart catheterization with coronary angiogram N/A 06/08/2012    Procedure: LEFT HEART CATHETERIZATION WITH CORONARY ANGIOGRAM;  Surgeon: Burnell Blanks, MD;  Location: [facility] CATH LAB;  Service: Cardiovascular;  Laterality: N/A;  . Shuntogram N/A 10/15/2013    Procedure: Fistulogram;  Surgeon: Serafina Mitchell, MD;  Location: [facility] CATH LAB;  Service: Cardiovascular;  Laterality: N/A;  . Left heart catheterization with coronary/graft angiogram N/A 12/10/2013    Procedure: LEFT HEART CATHETERIZATION WITH Beatrix Fetters;  Surgeon: [name], MD;  Location: [facility] CATH LAB;  Service: Cardiovascular;  Laterality: N/A;   Family History  Problem Relation Age of Onset  . Other      noncontributory for early CAD  . Heart disease Mother     Heart Disease before age 45  . Hyperlipidemia Mother   . Hypertension Mother   . Diabetes Mother   . Heart attack Mother   . Heart disease Father     Heart Disease before age 73  . Hyperlipidemia Father   . Hypertension Father   . Diabetes Father   . Diabetes Sister   .  Hypertension Sister   . Diabetes Brother   . Hyperlipidemia Brother   . Heart attack Brother   . Colon cancer Neg Hx   . Esophageal cancer Neg Hx   . Liver disease Neg Hx   . Kidney disease Neg Hx   . Colon polyps Neg Hx   . Hypertension Sister   . Heart attack Brother    History  Substance Use Topics  . Smoking status: Never Smoker   . Smokeless tobacco: Never Used  . Alcohol Use: No   OB History    No data available     Review of Systems    Allergies  Aspirin; Contrast media; Iron; Macrodantin; Penicillins; Plavix; Bactrim; Sulfa antibiotics; Venofer; Dexilant; Morphine and related; Prilosec; Levaquin; and Protonix  Home Medications   Prior to  Admission medications   Medication Sig Start Date End Date Taking? Authorizing Provider  acetaminophen (TYLENOL) 325 MG tablet Take 2 tablets (650 mg total) by mouth every 6 (six) hours as needed for mild pain (mild pain). 10/02/14  Yes Erline Hau, MD  ALPRAZolam Duanne Moron) 0.5 MG tablet Take 0.5 mg by mouth 3 (three) times daily.   Yes Historical Provider, MD  amLODipine (NORVASC) 10 MG tablet Take 1 tablet (10 mg total) by mouth daily. 12/17/13  Yes Lendon Colonel, NP  aspirin EC 81 MG tablet Take 81 mg by mouth daily.   Yes Historical Provider, MD  cloNIDine (CATAPRES) 0.2 MG tablet Take 1 tablet (0.2 mg total) by mouth 2 (two) times daily. 12/23/13  Yes Fay Records, MD  dextromethorphan-guaiFENesin [facility] DM) 30-600 MG per 12 hr tablet Take 1 tablet by mouth daily. Patient taking differently: Take 1 tablet by mouth 3 (three) times daily.  09/29/14  Yes Fredia Sorrow, MD  fluticasone (FLONASE) 50 MCG/ACT nasal spray Place 2 sprays into the nose daily as needed for allergies.    Yes Historical Provider, MD  folic acid-vitamin b complex-vitamin c-selenium-zinc (DIALYVITE) 3 MG TABS Take 1 tablet by mouth daily.   Yes Historical Provider, MD  hydrALAZINE (APRESOLINE) 25 MG tablet Take 1 tablet (25 mg total) by mouth 3  (three) times daily. 09/16/14  Yes Herminio Commons, MD  isosorbide dinitrate (ISORDIL) 30 MG tablet Take 1 tablet (30 mg total) by mouth 3 (three) times daily. 10/02/14  Yes Erline Hau, MD  labetalol (NORMODYNE) 200 MG tablet Take 200 mg by mouth 2 (two) times daily.   Yes Historical Provider, MD  lanthanum (FOSRENOL) 1000 MG chewable tablet Chew 1,000 mg by mouth 3 (three) times daily after meals.   Yes Historical Provider, MD  lidocaine-prilocaine (EMLA) cream Apply 1 application topically daily as needed (fo access).  11/29/14  Yes Historical Provider, MD  loratadine (CLARITIN) 10 MG tablet Take 10 mg by mouth daily.    Yes Historical Provider, MD  nitroGLYCERIN (NITROSTAT) 0.4 MG SL tablet Place 1 tablet (0.4 mg total) under the tongue every 5 (five) minutes x 3 doses as needed for chest pain. 06/23/14  Yes Herminio Commons, MD  omeprazole (PRILOSEC) 20 MG capsule Take 1 capsule (20 mg total) by mouth daily. 02/10/14  Yes Ladene Artist, MD  polyethylene glycol [facility] / Floria Raveling) packet Take 17 g by mouth daily. Patient taking differently: Take 17 g by mouth daily as needed for mild constipation or moderate constipation.  10/02/14  Yes Estela Leonie Green, MD  SENSIPAR 30 MG tablet Take 30 mg by mouth daily with supper.  12/21/12  Yes Historical Provider, MD  simvastatin (ZOCOR) 20 MG tablet Take 1 tablet (20 mg total) by mouth at bedtime. 11/04/14  Yes Herminio Commons, MD  tetrahydrozoline 0.05 % ophthalmic solution Place 1 drop into both eyes 2 (two) times daily as needed (irritation).   Yes Historical Provider, MD  benzonatate (TESSALON PERLES) 100 MG capsule Take 1 capsule (100 mg total) by mouth 3 (three) times daily as needed for cough. Patient not taking: Reported on 12/08/2014 10/02/14   Erline Hau, MD  fluconazole (DIFLUCAN) 150 MG tablet Take 1 tablet (150 mg total) by mouth once. Patient not taking: Reported on 12/08/2014 10/02/14   Erline Hau, MD   BP 190/64 mmHg  Pulse 70  Temp(Src) 98.1 F (36.7 C) (Oral)  Resp 18  Ht 5\' 1"  (1.549 m)  Wt 145 lb (65.772 kg)  BMI 27.41 kg/m2  SpO2 94% Physical Exam  ED Course  Procedures (including critical care time)  Medications  aspirin chewable tablet 162 mg (162 mg Oral Given 12/13/14 0857)  gi cocktail (Maalox,Lidocaine,Donnatal) (30 mLs Oral Given 12/13/14 1108)  cloNIDine (CATAPRES) tablet 0.2 mg (0.2 mg Oral Given 12/13/14 1115)  hydrALAZINE (APRESOLINE) tablet 25 mg (25 mg Oral Given 12/13/14 1122)    Patient Vitals for the past 24 hrs:  BP Temp Temp src Pulse Resp SpO2 Height Weight  12/13/14 1300 190/64 mmHg - - 70 18 94 % - -  12/13/14 1230 196/70 mmHg - - (!) 56 19 92 % - -  12/13/14 1200 199/70 mmHg - - 61 14 95 % - -  12/13/14 1130 200/79 mmHg - - 66 19 97 % - -  12/13/14 1115 (!) 193/132 mmHg - - - - - - -  12/13/14 1103 (!) 193/132 mmHg - - 64 18 96 % - -  12/13/14 1100 (!) 214/70 mmHg - - 63 15 96 % - -  12/13/14 1045 - - - 62 23 96 % - -  12/13/14 1044 (!) 203/74 mmHg - - 64 20 96 % - -  12/13/14 1030 (!) 203/74 mmHg - - 63 17 95 % - -  12/13/14 1015 - - - 60 13 96 % - -  12/13/14 1000 200/70 mmHg - - (!) 59 17 95 % - -  12/13/14 0930 (!) 201/67 mmHg - - 62 16 95 % - -  12/13/14 0900 (!) 202/84 mmHg - - 66 15 95 % - -  12/13/14 0838 193/77 mmHg 98.1 F (36.7 C) Oral 71 16 96 % 5\' 1"  (1.549 m) 145 lb (65.772 kg)  12/13/14 0830 193/77 mmHg - - 72 - 94 % - -    10:34 AM Reevaluation with update and discussion. After initial assessment and treatment, an updated evaluation reveals no recurrence of severe chest pain. She feels a mild tightness in her central lower chest. GI cocktail was ordered, second troponin pending. Daran Favaro L   1:30 PM Reevaluation with update and discussion. After initial assessment and treatment, an updated evaluation reveals she is comfortable now. She denies any chest discomfort at all. She is tolerating oral liquids and  food. Findings discussed with patient, all questions answered. Kendalyn Cranfield L   CRITICAL CARE Performed by: Daleen Bo L Total critical care time: 45 minutes Critical care time was exclusive of separately billable procedures and treating other patients. Critical care was necessary to treat or prevent imminent or life-threatening deterioration. Critical care was time spent personally by me on the following activities: development of treatment plan with patient and/or surrogate as well as nursing, discussions with consultants, evaluation of patient's response to treatment, examination of patient, obtaining history from patient or surrogate, ordering and performing treatments and interventions, ordering and review of laboratory studies, ordering and review of radiographic studies, pulse oximetry and re-evaluation of patient's condition.  Labs Review Labs Reviewed  CBC - Abnormal; Notable for the following:    RBC 3.41 (*)    Hemoglobin 10.5 (*)    HCT 33.4 (*)    RDW 16.2 (*)    All other components within normal limits  COMPREHENSIVE METABOLIC PANEL - Abnormal; Notable for the following:    Chloride 99 (*)    Glucose, Bld 110 (*)    BUN 21 (*)    Creatinine, Ser 6.79 (*)  Total Protein 6.0 (*)    ALT 13 (*)    GFR calc non Af Amer 5 (*)    GFR calc Af Amer 6 (*)    All other components within normal limits  I-STAT TROPOININ, ED  I-STAT TROPOININ, ED  Randolm Idol, ED    Imaging Review Dg Chest Port 1 View  12/13/2014   CLINICAL DATA:  Left shoulder pain upon waking.  EXAM: PORTABLE CHEST - 1 VIEW  COMPARISON:  One-view chest 12/08/2014.  FINDINGS: The heart is mildly enlarged. Median sternotomy is noted. There is no edema or effusion to suggest failure. Linear scarring or atelectasis is present at the right lung base. No focal airspace disease is present otherwise. Asymmetric degenerative changes are noted in the left shoulder.  IMPRESSION: 1. Stable cardiomegaly without  failure. 2. Linear atelectasis versus scarring at the right lung base. 3. Asymmetric left shoulder degenerative change.   Electronically Signed   By: San Morelle M.D.   On: 12/13/2014 09:44     EKG Interpretation   Date/Time:  Saturday December 13 2014 08:39:09 EDT Ventricular Rate:  72 PR Interval:  160 QRS Duration: 107 QT Interval:  442 QTC Calculation: 484 R Axis:   33 Text Interpretation:  Sinus rhythm Incomplete left bundle branch block LVH  with secondary repolarization abnormality Anterior Q waves, possibly due  to LVH since last valid tracing, no significant abnormality Confirmed by  Eulis Foster  MD, Keano Guggenheim [id number]) on 12/13/2014 8:44:40 AM      MDM   Final diagnoses:  Heartburn    Evaluation is consistent with heartburn as a source for her symptoms. I doubt ACS, PE or pneumonia. There is no indication to change her PPI at this time.  Nursing Notes Reviewed/ Care Coordinated Applicable Imaging Reviewed Interpretation of Laboratory Data incorporated into ED treatment  The patient appears reasonably screened and/or stabilized for discharge and I doubt any other medical condition or other [facility] requiring further screening, evaluation, or treatment in the ED at this time prior to discharge.  Plan: Home Medications- add Antacid AC/HS; Home Treatments- rest; return here if the recommended treatment, does not improve the symptoms; Recommended follow up- PCP 1 week for check up     Daleen Bo, MD 12/13/14 [place], MD 12/22/14 1955

## 2014-12-13 NOTE — ED Notes (Signed)
Pt states she woke up this morning with left shoulder pain @ 7am. Pt states intense pain "for a while". Pt took one NTG tab which improved the pain some, per pt. Pt last had dialysis yesterday. States pain radiated slightly to left upper chest, shoulder blade and down the left arm.

## 2014-12-19 ENCOUNTER — Encounter: Payer: Self-pay | Admitting: Cardiovascular Disease

## 2014-12-19 ENCOUNTER — Ambulatory Visit (INDEPENDENT_AMBULATORY_CARE_PROVIDER_SITE_OTHER): Payer: Medicare Other | Admitting: Cardiovascular Disease

## 2014-12-19 VITALS — BP 171/77 | HR 78 | Ht 61.0 in | Wt 145.0 lb

## 2014-12-19 DIAGNOSIS — I5032 Chronic diastolic (congestive) heart failure: Secondary | ICD-10-CM | POA: Diagnosis not present

## 2014-12-19 DIAGNOSIS — N186 End stage renal disease: Secondary | ICD-10-CM | POA: Diagnosis not present

## 2014-12-19 DIAGNOSIS — Z951 Presence of aortocoronary bypass graft: Secondary | ICD-10-CM

## 2014-12-19 DIAGNOSIS — I779 Disorder of arteries and arterioles, unspecified: Secondary | ICD-10-CM

## 2014-12-19 DIAGNOSIS — I25768 Atherosclerosis of bypass graft of coronary artery of transplanted heart with other forms of angina pectoris: Secondary | ICD-10-CM

## 2014-12-19 DIAGNOSIS — I739 Peripheral vascular disease, unspecified: Secondary | ICD-10-CM

## 2014-12-19 DIAGNOSIS — I10 Essential (primary) hypertension: Secondary | ICD-10-CM

## 2014-12-19 DIAGNOSIS — Z9289 Personal history of other medical treatment: Secondary | ICD-10-CM

## 2014-12-19 DIAGNOSIS — I6523 Occlusion and stenosis of bilateral carotid arteries: Secondary | ICD-10-CM

## 2014-12-19 DIAGNOSIS — Z87898 Personal history of other specified conditions: Secondary | ICD-10-CM

## 2014-12-19 DIAGNOSIS — I340 Nonrheumatic mitral (valve) insufficiency: Secondary | ICD-10-CM

## 2014-12-19 MED ORDER — CLONIDINE HCL 0.3 MG PO TABS: 0.3000 mg | ORAL_TABLET | Freq: Two times a day (BID) | ORAL | Status: DC

## 2014-12-19 MED ORDER — HYDRALAZINE HCL 50 MG PO TABS: 50.0000 mg | ORAL_TABLET | Freq: Three times a day (TID) | ORAL | Status: DC

## 2014-12-19 NOTE — Progress Notes (Signed)
Patient ID: BLIA ALVARDO, female   DOB: 06-25-1941, 74 y.o.   MRN: [id number]      SUBJECTIVE: The patient returns for routine follow-up. She was evaluated in the ED on 5/30 for dizziness and was markedly hypertensive. Chest x-ray showed patchy bilateral lobe opacities, likely atelectasis in etiology.  She was evaluated in the ED again on 6/4. Point-of-care troponin normal on 6/4. She had awoken with left shoulder pain with some improvement with nitroglycerin, and slight radiation to left arm. Chest x-ray on 6/4 showed stable cardiomegaly without failure and linear atelectasis versus scarring at the right lung base.  She was hospitalized for acute bronchitis and mild diastolic heart failure in March 2016 due to missed hemodialysis session.  In summary, she has a history of coronary artery disease status post CABG, malignant hypertension, hyperlipidemia, chronic diastolic heart failure, and end-stage renal disease on hemodialysis.   She had a normal nuclear cardiac stress test on 08/25/14, LVEF 57%. Coronary angiography on 12/10/13 demonstrated a patent left main coronary artery, focal significant disease in the mid to distal left anterior descending artery, patent diagonal branch, LIMA to LAD was widely patent, severe restenosis of the proximal left circumflex artery stent, patent SVG to circumflex, 70% proximal ramus stenosis, widely patent SVG to ramus, occluded stent at the ostium of the right coronary artery, and widely patent SVG to distal RCA.  Echocardiogram on 11/27/13 demonstrated normal left ventricular systolic function, EF 0000000, moderate LVH, grade 1 diastolic dysfunction, elevated filling pressures, and mild to moderate mitral regurgitation.   Blood pressures at home have ranged from 178-204/70-90. When it is elevated, she feels fatigued and has chest pressure.     Review of Systems: As per "subjective", otherwise negative.  Allergies  Allergen Reactions  . Aspirin Other (See  Comments)    Mess up her stomach; "makes my bowels have blood in them". Takes 81 mg EC Aspirin   . Contrast Media [Iodinated Diagnostic Agents] Itching  . Iron Itching and Other (See Comments)    "they gave me iron in dialysis; had to give me Benadryl cause I had to have the iron" (05/02/2012)  . Macrodantin [Nitrofurantoin Macrocrystal] Other (See Comments)    "broke me out in big old knots all over my body; had to go to ER"  . Penicillins Other (See Comments)    "makes me real weak when I take it; like I'll pass out"  . Plavix [Clopidogrel Bisulfate] Rash  . Bactrim [Sulfamethoxazole-Trimethoprim] Rash  . Sulfa Antibiotics Rash  . Venofer [Ferric Oxide] Itching    Patient reports using Benadryl prior to doses as [facility]  . Dexilant [Dexlansoprazole] Other (See Comments)    Upset stomach  . Morphine And Related     Itching in feet  . Prilosec [Omeprazole] Other (See Comments)    "back spasms"  . Levaquin [Levofloxacin In D5w] Rash  . Protonix [Pantoprazole Sodium] Rash    Current Outpatient Prescriptions  Medication Sig Dispense Refill  . acetaminophen (TYLENOL) 325 MG tablet Take 2 tablets (650 mg total) by mouth every 6 (six) hours as needed for mild pain (mild pain).    Marland Kitchen ALPRAZolam (XANAX) 0.5 MG tablet Take 0.5 mg by mouth 3 (three) times daily.    Marland Kitchen amLODipine (NORVASC) 10 MG tablet Take 1 tablet (10 mg total) by mouth daily. 30 tablet 6  . aspirin EC 81 MG tablet Take 81 mg by mouth daily.    . benzonatate (TESSALON PERLES) 100 MG capsule Take  1 capsule (100 mg total) by mouth 3 (three) times daily as needed for cough. 20 capsule 0  . cloNIDine (CATAPRES) 0.2 MG tablet Take 1 tablet (0.2 mg total) by mouth 2 (two) times daily. 60 tablet 11  . dextromethorphan-guaiFENesin (MUCINEX DM) 30-600 MG per 12 hr tablet Take 1 tablet by mouth daily. (Patient taking differently: Take 1 tablet by mouth 3 (three) times daily as needed. ) 14 tablet 1  . fluticasone (FLONASE) 50  MCG/ACT nasal spray Place 2 sprays into the nose daily as needed for allergies.     . folic acid-vitamin b complex-vitamin c-selenium-zinc (DIALYVITE) 3 MG TABS Take 1 tablet by mouth daily.    . hydrALAZINE (APRESOLINE) 25 MG tablet Take 1 tablet (25 mg total) by mouth 3 (three) times daily. 90 tablet 6  . isosorbide dinitrate (ISORDIL) 30 MG tablet Take 1 tablet (30 mg total) by mouth 3 (three) times daily. 90 tablet 1  . labetalol (NORMODYNE) 200 MG tablet Take 200 mg by mouth 2 (two) times daily.    Marland Kitchen lanthanum (FOSRENOL) 1000 MG chewable tablet Chew 1,000 mg by mouth 3 (three) times daily after meals.    . lidocaine-prilocaine (EMLA) cream Apply 1 application topically daily as needed (fo access).     Marland Kitchen loratadine (CLARITIN) 10 MG tablet Take 10 mg by mouth daily.     . nitroGLYCERIN (NITROSTAT) 0.4 MG SL tablet Place 1 tablet (0.4 mg total) under the tongue every 5 (five) minutes x 3 doses as needed for chest pain. 25 tablet 3  . omeprazole (PRILOSEC) 20 MG capsule Take 1 capsule (20 mg total) by mouth daily. 30 capsule 11  . polyethylene glycol (MIRALAX / GLYCOLAX) packet Take 17 g by mouth daily. (Patient taking differently: Take 17 g by mouth daily as needed for mild constipation or moderate constipation. ) 14 each 0  . SENSIPAR 30 MG tablet Take 30 mg by mouth daily with supper.     . simvastatin (ZOCOR) 20 MG tablet Take 1 tablet (20 mg total) by mouth at bedtime. 30 tablet 3  . tetrahydrozoline 0.05 % ophthalmic solution Place 1 drop into both eyes 2 (two) times daily as needed (irritation).     No current facility-administered medications for this visit.    Past Medical History  Diagnosis Date  . Chronic bronchitis   . GERD (gastroesophageal reflux disease)   . PUD (peptic ulcer disease)   . History of lower GI bleeding   . Arthritis   . History of gout   . CAD (coronary artery disease)     a. 12/2011 NSTEMI/Cath/PCI LCX (2.25x14 Resolute DES) & D1 (2.25x22 Resolute DES);  b.  01/2012 Cath/PCI: LM 30, LAD 30p, 40-8m, D1 stent ok, 99 in sm branch of diag, LCX patent stent, OM1 20, RCA 95 ost (4.0x12 Promus DES), EF 55%;  c. 04/2012 Lexi Cardiolite  EF 48%, small area of scar @ base/mid inflat wall with mild peri-infarct ischemia.; CABG 12/4  . High cholesterol 12/2011  . Pneumonia ~ 2009  . Iron deficiency anemia   . TIA (transient ischemic attack)   . Anxiety   . History of blood transfusion 07/2011; 12/2011; 01/2012 X 2; 04/2012  . Carotid artery disease     a. [id number] LICA, [id number]   . Mitral regurgitation     a. Moderate by echo, 02/2012  . Myocardial infarction   . Chronic diastolic CHF (congestive heart failure)     a. 02/2012 Echo EF 60-65%, nl wall  motion, Gr 1 DD, mod MR  . Hypertension   . AVM (arteriovenous malformation) of colon   . Esophageal stricture   . Ovarian cancer 1992  . Colon cancer 1992  . ESRD on hemodialysis     ESRD due to HTN, started dialysis 2011 and gets HD at [facility] with Dr Hinda Lenis on MWF schedule.  Access is LUA AVF as of Sept 2014.     Past Surgical History  Procedure Laterality Date  . Abdominal hysterectomy  1992  . Appendectomy  06/1990  . Tubal ligation  1980's  . Av fistula placement  07/2009    left upper arm  . Thrombectomy / arteriovenous graft revision  2011    left upper arm  . Colon resection  1992  . Esophagogastroduodenoscopy  01/20/2012    Procedure: ESOPHAGOGASTRODUODENOSCOPY (EGD);  Surgeon: Ladene Artist, MD,FACG;  Location: [facility] ENDOSCOPY;  Service: Endoscopy;  Laterality: N/A;  . Dilation and curettage of uterus    . Coronary angioplasty with stent placement  12/15/11    "2"  . Coronary angioplasty with stent placement  y/2013    "1; makes total of 3" (05/02/2012)  . Coronary artery bypass graft  06/13/2012    Procedure: CORONARY ARTERY BYPASS GRAFTING (CABG);  Surgeon: Grace Isaac, MD;  Location: [facility];  Service: Open Heart Surgery;  Laterality: N/A;  cabg x four;  using left internal mammary  artery, and left leg greater saphenous vein harvested endoscopically  . Intraoperative transesophageal echocardiogram  06/13/2012    Procedure: INTRAOPERATIVE TRANSESOPHAGEAL ECHOCARDIOGRAM;  Surgeon: Grace Isaac, MD;  Location: [facility];  Service: Open Heart Surgery;  Laterality: N/A;  . Esophagogastroduodenoscopy N/A 03/26/2013    Procedure: ESOPHAGOGASTRODUODENOSCOPY (EGD);  Surgeon: Irene Shipper, MD;  Location: [facility] ENDOSCOPY;  Service: Endoscopy;  Laterality: N/A;  . Ovary surgery      ovarian cancer  . Cardiac surgery    . Left heart catheterization with coronary angiogram N/A 12/15/2011    Procedure: LEFT HEART CATHETERIZATION WITH CORONARY ANGIOGRAM;  Surgeon: Burnell Blanks, MD;  Location: [facility] CATH LAB;  Service: Cardiovascular;  Laterality: N/A;  . Left heart catheterization with coronary angiogram N/A 01/10/2012    Procedure: LEFT HEART CATHETERIZATION WITH CORONARY ANGIOGRAM;  Surgeon: Peter M Martinique, MD;  Location: [facility] CATH LAB;  Service: Cardiovascular;  Laterality: N/A;  . Left heart catheterization with coronary angiogram N/A 06/08/2012    Procedure: LEFT HEART CATHETERIZATION WITH CORONARY ANGIOGRAM;  Surgeon: Burnell Blanks, MD;  Location: [facility] CATH LAB;  Service: Cardiovascular;  Laterality: N/A;  . Shuntogram N/A 10/15/2013    Procedure: Fistulogram;  Surgeon: Serafina Mitchell, MD;  Location: [facility] CATH LAB;  Service: Cardiovascular;  Laterality: N/A;  . Left heart catheterization with coronary/graft angiogram N/A 12/10/2013    Procedure: LEFT HEART CATHETERIZATION WITH Beatrix Fetters;  Surgeon: [name], MD;  Location: [facility] CATH LAB;  Service: Cardiovascular;  Laterality: N/A;    History   Social History  . Marital Status: Married    Spouse Name: N/A  . Number of Children: N/A  . Years of Education: N/A   Occupational History  . Not on file.   Social History Main Topics  . Smoking status: Never Smoker   . Smokeless tobacco: Never Used  .  Alcohol Use: No  . Drug Use: No  . Sexual Activity: Yes    Birth Control/ Protection: Surgical   Other Topics Concern  . Not on file   Social History  Narrative   Lives in [place], [place] with husband.  Dialysis pt - mwf.     Filed Vitals:   12/19/14 1616  BP: 171/77  Pulse: 78  Height: 5\' 1"  (1.549 m)  Weight: 145 lb (65.772 kg)    PHYSICAL EXAM General: NAD HEENT: Normal. Neck: No JVD, no thyromegaly. Lungs: Clear to auscultation bilaterally with normal respiratory effort. CV: Nondisplaced PMI.  Regular rate and rhythm, normal S1/S2, no S3/S4, no murmur. No pretibial or periankle edema.    Abdomen: Soft, nontender, no distention.  Neurologic: Alert and oriented x 3.  Psych: Normal affect. Skin: Normal. Musculoskeletal: No gross deformities. Extremities: No clubbing or cyanosis.   ECG: Most recent ECG reviewed.      ASSESSMENT AND PLAN: 1. CAD s/p CABG: She is symptomatically stable. Normal nuclear stress test (08/25/14). I will continue ASA, labetalol, isosorbide dinitrate, and statin. Needs better BP control.  2. Malignant HTN: Elevated onamlodipine, clonidine, hydralazine, and labetalol. I will increase hydralazine to 50 mg tid and clonidine to 0.3 mg bid. I have instructed her not to take the afternoon doses on dialysis days when her BP occasionally drops.  3. Hyperlipidemia: Will obtain copy of lipids.  4. Mitral regurgitation: Mild to moderate in 11/2013.   5. Carotid artery stenosis: Less than 40% bilaterally on 08/14/14. Continue surveillance monitoring.   Dispo: f/u 6 months.    Kate Sable, M.D., F.A.C.C.

## 2014-12-19 NOTE — Patient Instructions (Signed)
   Increase Clonidine to 0.3mg  twice a day   Increase Hydralazine to 50mg  three times per day  New prescriptions sent to [facility] today. Continue all other medications.   Your physician wants you to follow up in: 6 months.  You will receive a reminder letter in the mail one-two months in advance.  If you don't receive a letter, please call our office to schedule the follow up appointment

## 2014-12-22 ENCOUNTER — Emergency Department (HOSPITAL_COMMUNITY)
Admission: EM | Admit: 2014-12-22 | Discharge: 2014-12-22 | Disposition: A | Discharge status: Home / Self Care | Payer: Medicare Other | Attending: Emergency Medicine | Admitting: Emergency Medicine

## 2014-12-22 ENCOUNTER — Emergency Department (HOSPITAL_COMMUNITY): Payer: Medicare Other

## 2014-12-22 ENCOUNTER — Encounter (HOSPITAL_COMMUNITY): Payer: Self-pay | Admitting: *Deleted

## 2014-12-22 DIAGNOSIS — I120 Hypertensive chronic kidney disease with stage 5 chronic kidney disease or end stage renal disease: Secondary | ICD-10-CM | POA: Diagnosis not present

## 2014-12-22 DIAGNOSIS — Z880 Allergy status to penicillin: Secondary | ICD-10-CM | POA: Diagnosis not present

## 2014-12-22 DIAGNOSIS — Z9861 Coronary angioplasty status: Secondary | ICD-10-CM | POA: Insufficient documentation

## 2014-12-22 DIAGNOSIS — K219 Gastro-esophageal reflux disease without esophagitis: Secondary | ICD-10-CM | POA: Diagnosis not present

## 2014-12-22 DIAGNOSIS — Z8711 Personal history of peptic ulcer disease: Secondary | ICD-10-CM | POA: Diagnosis not present

## 2014-12-22 DIAGNOSIS — Z7982 Long term (current) use of aspirin: Secondary | ICD-10-CM | POA: Insufficient documentation

## 2014-12-22 DIAGNOSIS — I5032 Chronic diastolic (congestive) heart failure: Secondary | ICD-10-CM | POA: Diagnosis not present

## 2014-12-22 DIAGNOSIS — E780 Pure hypercholesterolemia: Secondary | ICD-10-CM | POA: Insufficient documentation

## 2014-12-22 DIAGNOSIS — N186 End stage renal disease: Secondary | ICD-10-CM | POA: Insufficient documentation

## 2014-12-22 DIAGNOSIS — Z8673 Personal history of transient ischemic attack (TIA), and cerebral infarction without residual deficits: Secondary | ICD-10-CM | POA: Insufficient documentation

## 2014-12-22 DIAGNOSIS — R600 Localized edema: Secondary | ICD-10-CM | POA: Insufficient documentation

## 2014-12-22 DIAGNOSIS — R0602 Shortness of breath: Secondary | ICD-10-CM | POA: Insufficient documentation

## 2014-12-22 DIAGNOSIS — D509 Iron deficiency anemia, unspecified: Secondary | ICD-10-CM | POA: Diagnosis not present

## 2014-12-22 DIAGNOSIS — Z951 Presence of aortocoronary bypass graft: Secondary | ICD-10-CM | POA: Diagnosis not present

## 2014-12-22 DIAGNOSIS — Z79899 Other long term (current) drug therapy: Secondary | ICD-10-CM | POA: Diagnosis not present

## 2014-12-22 DIAGNOSIS — F419 Anxiety disorder, unspecified: Secondary | ICD-10-CM | POA: Diagnosis not present

## 2014-12-22 DIAGNOSIS — Z8543 Personal history of malignant neoplasm of ovary: Secondary | ICD-10-CM | POA: Insufficient documentation

## 2014-12-22 DIAGNOSIS — M7989 Other specified soft tissue disorders: Secondary | ICD-10-CM | POA: Insufficient documentation

## 2014-12-22 DIAGNOSIS — I252 Old myocardial infarction: Secondary | ICD-10-CM | POA: Insufficient documentation

## 2014-12-22 DIAGNOSIS — Z992 Dependence on renal dialysis: Secondary | ICD-10-CM | POA: Diagnosis not present

## 2014-12-22 DIAGNOSIS — M1990 Unspecified osteoarthritis, unspecified site: Secondary | ICD-10-CM | POA: Insufficient documentation

## 2014-12-22 DIAGNOSIS — I2510 Atherosclerotic heart disease of native coronary artery without angina pectoris: Secondary | ICD-10-CM | POA: Insufficient documentation

## 2014-12-22 DIAGNOSIS — Z85038 Personal history of other malignant neoplasm of large intestine: Secondary | ICD-10-CM | POA: Insufficient documentation

## 2014-12-22 DIAGNOSIS — Z8701 Personal history of pneumonia (recurrent): Secondary | ICD-10-CM | POA: Diagnosis not present

## 2014-12-22 LAB — BASIC METABOLIC PANEL
Anion gap: 17 — ABNORMAL HIGH (ref 5–15)
BUN: 66 mg/dL — AB (ref 6–20)
CO2: 23 mmol/L (ref 22–32)
Calcium: 9.8 mg/dL (ref 8.9–10.3)
Chloride: 103 mmol/L (ref 101–111)
Creatinine, Ser: 12.09 mg/dL — ABNORMAL HIGH (ref 0.44–1.00)
GFR calc Af Amer: 3 mL/min — ABNORMAL LOW (ref >60)
GFR calc non Af Amer: 3 mL/min — ABNORMAL LOW (ref >60)
GLUCOSE: 131 mg/dL — AB (ref 65–99)
POTASSIUM: 4.9 mmol/L (ref 3.5–5.1)
Sodium: 143 mmol/L (ref 135–145)

## 2014-12-22 LAB — CBC WITH DIFFERENTIAL/PLATELET
Basophils Absolute: 0.1 10*3/uL (ref 0.0–0.1)
Basophils Relative: 1 % (ref 0–1)
EOS PCT: 11 % — AB (ref 0–5)
Eosinophils Absolute: 0.8 10*3/uL — ABNORMAL HIGH (ref 0.0–0.7)
HEMATOCRIT: 30.2 % — AB (ref 36.0–46.0)
Hemoglobin: 9.3 g/dL — ABNORMAL LOW (ref 12.0–15.0)
LYMPHS PCT: 13 % (ref 12–46)
Lymphs Abs: 1 10*3/uL (ref 0.7–4.0)
MCH: 30.3 pg (ref 26.0–34.0)
MCHC: 30.8 g/dL (ref 30.0–36.0)
MCV: 98.4 fL (ref 78.0–100.0)
Monocytes Absolute: 0.6 10*3/uL (ref 0.1–1.0)
Monocytes Relative: 8 % (ref 3–12)
Neutro Abs: 5.3 10*3/uL (ref 1.7–7.7)
Neutrophils Relative %: 67 % (ref 43–77)
PLATELETS: 300 10*3/uL (ref 150–400)
RBC: 3.07 MIL/uL — ABNORMAL LOW (ref 3.87–5.11)
RDW: 16.9 % — ABNORMAL HIGH (ref 11.5–15.5)
WBC: 7.9 10*3/uL (ref 4.0–10.5)

## 2014-12-22 LAB — TROPONIN I: Troponin I: 0.03 ng/mL (ref <0.031)

## 2014-12-22 IMAGING — DX DG CHEST 2V
2 series · 2 of 2 positions shown · non-contrast
Comparison: [DATE]

CLINICAL DATA: Shortness of breath woke her up. Dialysis due today.
Bilateral lower extremity edema.

EXAM:
CHEST  2 VIEW

[chest pa]
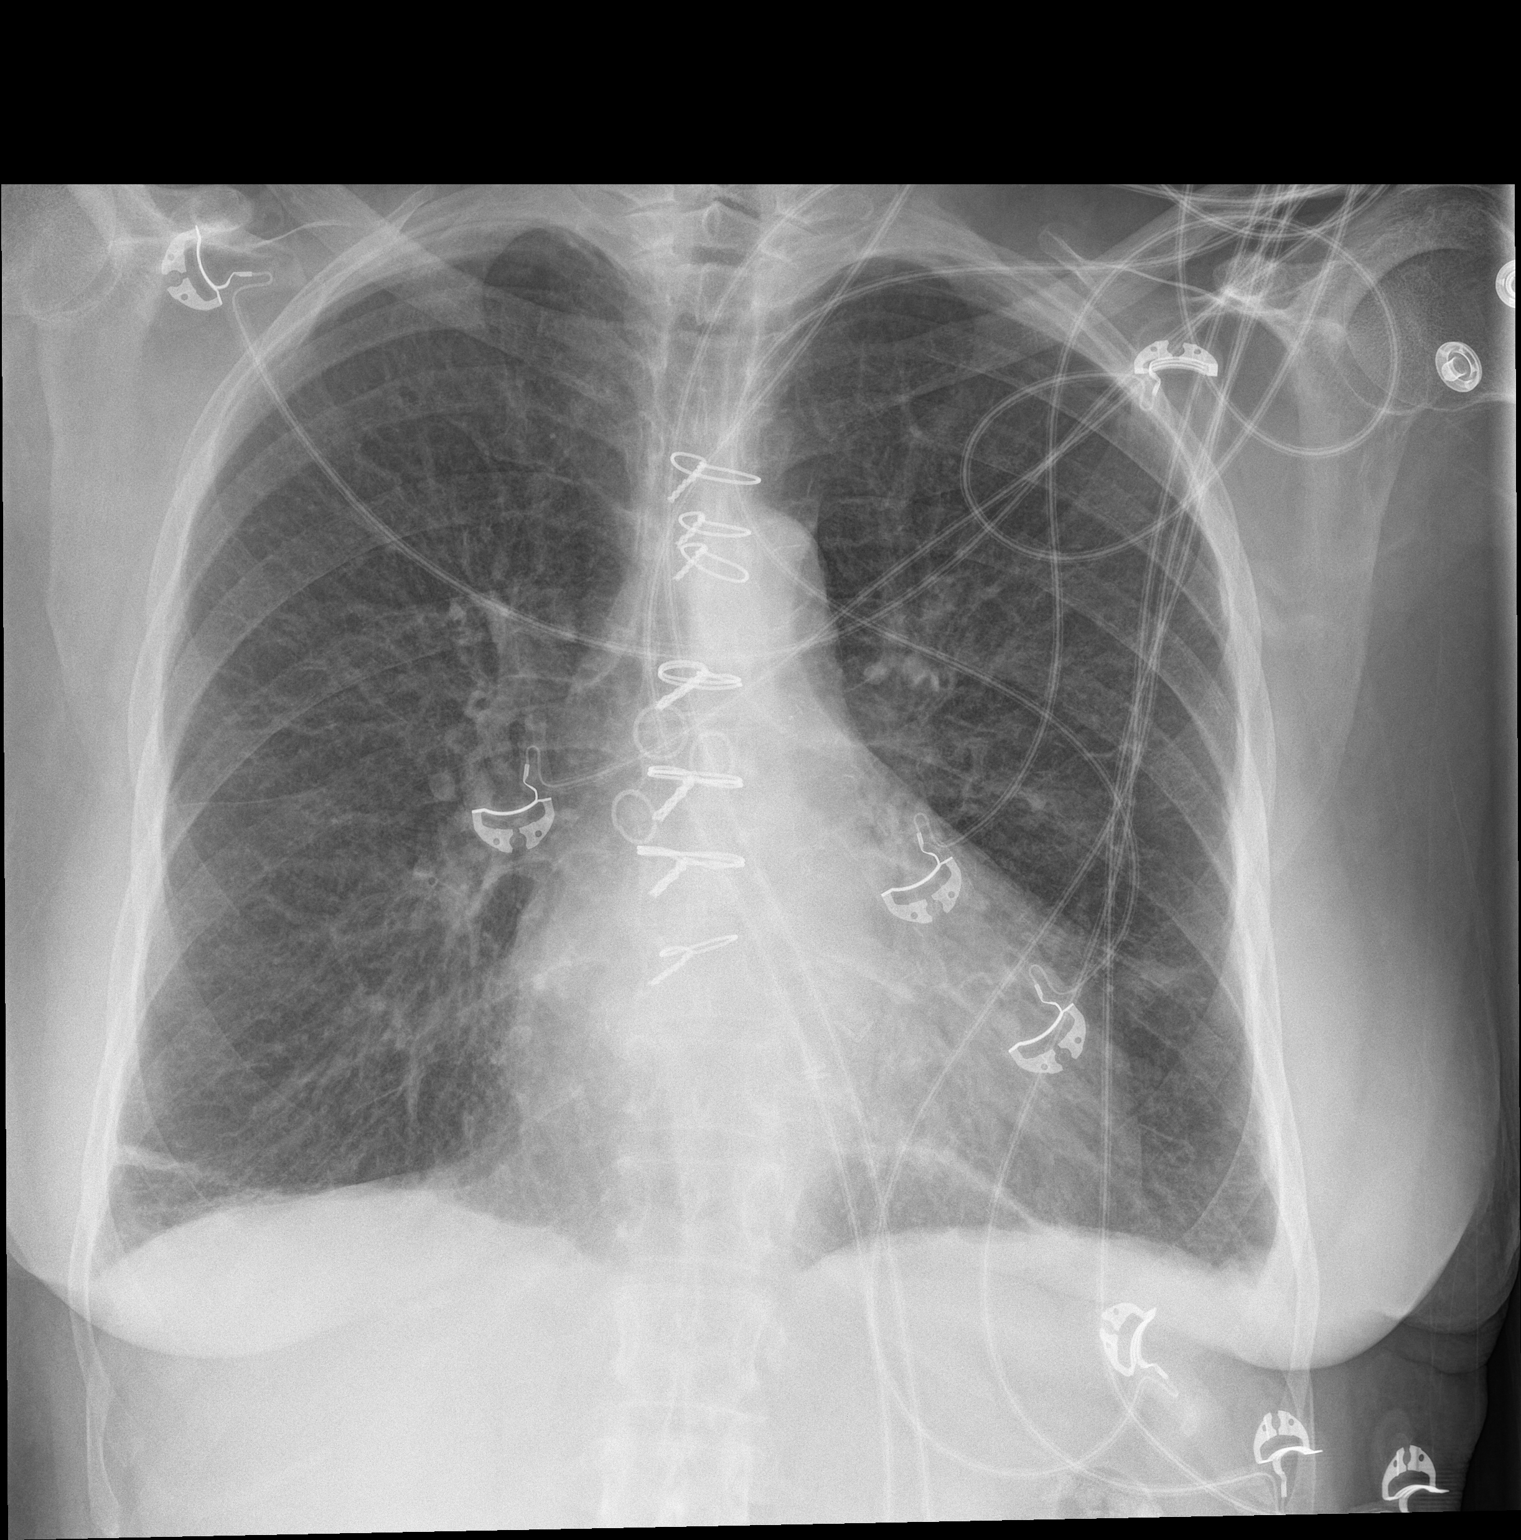

[chest lat]
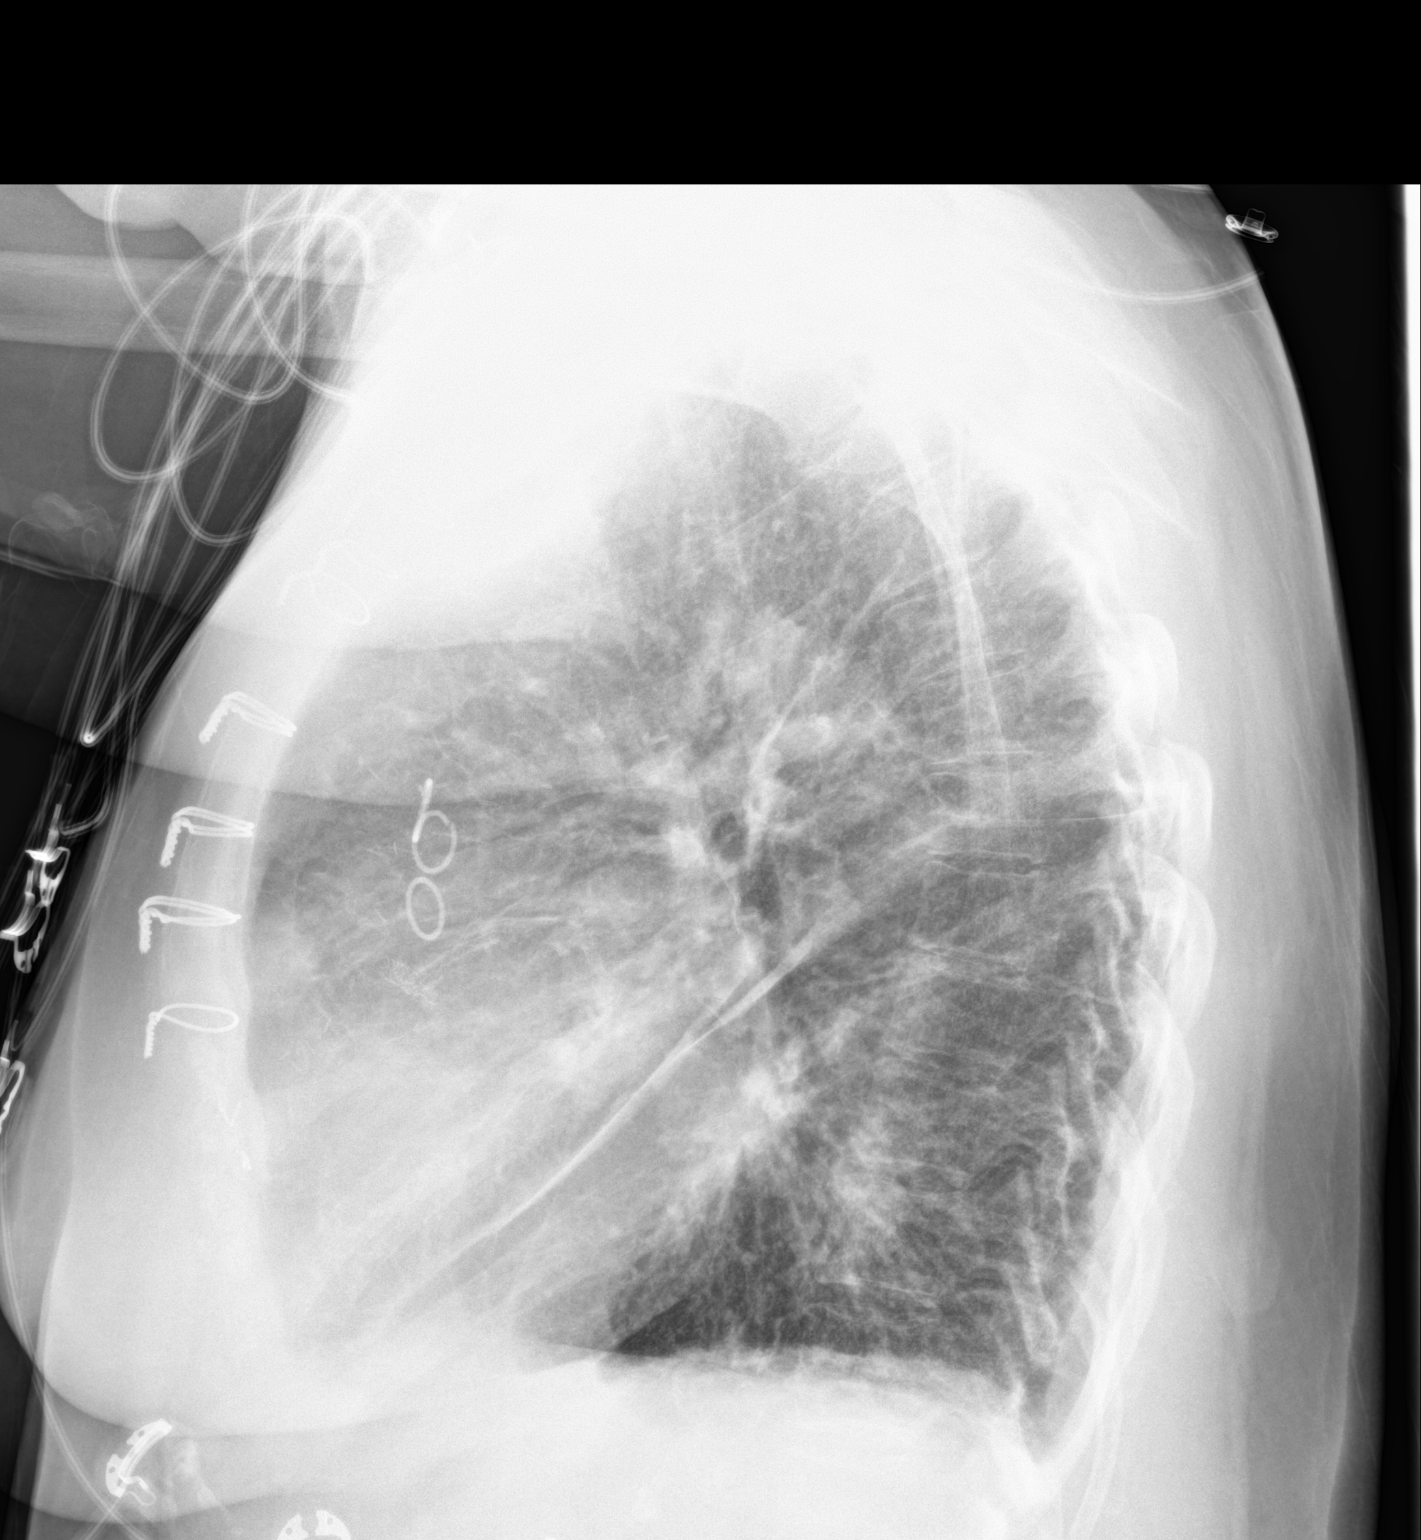

[2 of 2 positions shown; findings below may reference images not displayed]

FINDINGS: Borderline heart size with normal pulmonary vascularity. Slight
interstitial changes in the lung bases may represent early
interstitial edema. No evidence of airspace disease. Linear
atelectasis or scarring in the lung bases. No blunting of
costophrenic angles. Small amount of fluid in the major fissures.
Emphysematous changes in the lungs. Calcified and tortuous aorta.
IMPRESSION: Borderline heart size suggestion of early interstitial edema. Small
amount of fluid in the fissures. No focal consolidation.

## 2014-12-22 MED ORDER — IPRATROPIUM-ALBUTEROL 0.5-2.5 (3) MG/3ML IN SOLN
3.0000 mL | Freq: Once | RESPIRATORY_TRACT | Status: AC
  Administered 2014-12-22: 3 mL via RESPIRATORY_TRACT
  Filled 2014-12-22: qty 3

## 2014-12-22 NOTE — Discharge Instructions (Signed)
You were seen today for shortness of breath. Your workup is largely reassuring. There is no evidence of pneumonia. Your lungs do appear that they're beginning to get some fluid on them. This is likely related to your need for hemodialysis later today. You should follow-up with dialysis as scheduled. If you develop fever, worsening shortness of breath, chest pain, or any new or worsening symptoms, you should be reevaluated.  Shortness of Breath Shortness of breath means you have trouble breathing. It could also mean that you have a medical problem. You should get immediate medical care for shortness of breath. CAUSES   Not enough oxygen in the air such as with high altitudes or a smoke-filled room.  Certain lung diseases, infections, or problems.  Heart disease or conditions, such as angina or heart failure.  Low red blood cells (anemia).  Poor physical fitness, which can cause shortness of breath when you exercise.  Chest or back injuries or stiffness.  Being overweight.  Smoking.  Anxiety, which can make you feel like you are not getting enough air. DIAGNOSIS  Serious medical problems can often be found during your physical exam. Tests may also be done to determine why you are having shortness of breath. Tests may include:  Chest X-rays.  Lung function tests.  Blood tests.  An electrocardiogram (ECG).  An ambulatory electrocardiogram. An ambulatory ECG records your heartbeat patterns over a 24-hour period.  Exercise testing.  A transthoracic echocardiogram (TTE). During echocardiography, sound waves are used to evaluate how blood flows through your heart.  A transesophageal echocardiogram (TEE).  Imaging scans. Your health care provider may not be able to find a cause for your shortness of breath after your exam. In this case, it is important to have a follow-up exam with your health care provider as directed.  TREATMENT  Treatment for shortness of breath depends on the  cause of your symptoms and can vary greatly. HOME CARE INSTRUCTIONS   Do not smoke. Smoking is a common cause of shortness of breath. If you smoke, ask for help to quit.  Avoid being around chemicals or things that may bother your breathing, such as paint fumes and dust.  Rest as needed. Slowly resume your usual activities.  If medicines were prescribed, take them as directed for the full length of time directed. This includes oxygen and any inhaled medicines.  Keep all follow-up appointments as directed by your health care provider. SEEK MEDICAL CARE IF:   Your condition does not improve in the time expected.  You have a hard time doing your normal activities even with rest.  You have any new symptoms. SEEK IMMEDIATE MEDICAL CARE IF:   Your shortness of breath gets worse.  You feel light-headed, faint, or develop a cough not controlled with medicines.  You start coughing up blood.  You have pain with breathing.  You have chest pain or pain in your arms, shoulders, or abdomen.  You have a fever.  You are unable to walk up stairs or exercise the way you normally do. MAKE SURE YOU:  Understand these instructions.  Will watch your condition.  Will get help right away if you are not doing well or get worse. Document Released: 03/22/2001 Document Revised: 07/02/2013 Document Reviewed: 09/12/2011 [facility] Patient Information 2015 [place], [place]. This information is not intended to replace advice given to you by your health care provider. Make sure you discuss any questions you have with your health care provider.

## 2014-12-22 NOTE — ED Notes (Signed)
Patient verbalizes understanding of discharge instructions, home care and follow up care. Patient out of department at this time with family. 

## 2014-12-22 NOTE — ED Notes (Signed)
Pt states she woke this morning & was short of breath.

## 2014-12-22 NOTE — ED Notes (Signed)
Pt states SOB that woke her up. Patient is on Dialysis MWF-due today. Patient states "its fluid build up" patient has bilateral lower extremity edema +2

## 2014-12-22 NOTE — ED Provider Notes (Signed)
CSN: [id number]     Arrival date & time 12/22/14  0310 History   First MD Initiated Contact with Patient 12/22/14 0321     Chief Complaint  Patient presents with  . Shortness of Breath     (Consider location/radiation/quality/duration/timing/severity/associated sxs/prior Treatment) HPI  This is a 74 year-old female with a history of chronic bronchitis, end-stage renal disease on dialysis Monday, Wednesday, and Friday, coronary artery disease who presents with shortness of breath. Patient reports onset of shortness of breath upon awakening this morning. She is due for dialysis later today. She dialyzed on Friday. She denies cough or fever. She denies chest pain. Patient reports somewhat increased lower extremity edema.  Past Medical History  Diagnosis Date  . Chronic bronchitis   . GERD (gastroesophageal reflux disease)   . PUD (peptic ulcer disease)   . History of lower GI bleeding   . Arthritis   . History of gout   . CAD (coronary artery disease)     a. 12/2011 NSTEMI/Cath/PCI LCX (2.25x14 Resolute DES) & D1 (2.25x22 Resolute DES);  b. 01/2012 Cath/PCI: LM 30, LAD 30p, 40-82m, D1 stent ok, 99 in sm branch of diag, LCX patent stent, OM1 20, RCA 95 ost (4.0x12 Promus DES), EF 55%;  c. 04/2012 Lexi Cardiolite  EF 48%, small area of scar @ base/mid inflat wall with mild peri-infarct ischemia.; CABG 12/4  . High cholesterol 12/2011  . Pneumonia ~ 2009  . Iron deficiency anemia   . TIA (transient ischemic attack)   . Anxiety   . History of blood transfusion 07/2011; 12/2011; 01/2012 X 2; 04/2012  . Carotid artery disease     a. [id number] LICA, [id number]   . Mitral regurgitation     a. Moderate by echo, 02/2012  . Myocardial infarction   . Chronic diastolic CHF (congestive heart failure)     a. 02/2012 Echo EF 60-65%, nl wall motion, Gr 1 DD, mod MR  . Hypertension   . AVM (arteriovenous malformation) of colon   . Esophageal stricture   . Ovarian cancer 1992  . Colon cancer 1992  . ESRD on  hemodialysis     ESRD due to HTN, started dialysis 2011 and gets HD at [facility] with Dr Hinda Lenis on MWF schedule.  Access is LUA AVF as of Sept 2014.    Past Surgical History  Procedure Laterality Date  . Abdominal hysterectomy  1992  . Appendectomy  06/1990  . Tubal ligation  1980's  . Av fistula placement  07/2009    left upper arm  . Thrombectomy / arteriovenous graft revision  2011    left upper arm  . Colon resection  1992  . Esophagogastroduodenoscopy  01/20/2012    Procedure: ESOPHAGOGASTRODUODENOSCOPY (EGD);  Surgeon: Ladene Artist, MD,FACG;  Location: [facility] ENDOSCOPY;  Service: Endoscopy;  Laterality: N/A;  . Dilation and curettage of uterus    . Coronary angioplasty with stent placement  12/15/11    "2"  . Coronary angioplasty with stent placement  y/2013    "1; makes total of 3" (05/02/2012)  . Coronary artery bypass graft  06/13/2012    Procedure: CORONARY ARTERY BYPASS GRAFTING (CABG);  Surgeon: Grace Isaac, MD;  Location: [place];  Service: Open Heart Surgery;  Laterality: N/A;  cabg x four;  using left internal mammary artery, and left leg greater saphenous vein harvested endoscopically  . Intraoperative transesophageal echocardiogram  06/13/2012    Procedure: INTRAOPERATIVE TRANSESOPHAGEAL ECHOCARDIOGRAM;  Surgeon: Grace Isaac, MD;  Location:  [place] OR;  Service: Open Heart Surgery;  Laterality: N/A;  . Esophagogastroduodenoscopy N/A 03/26/2013    Procedure: ESOPHAGOGASTRODUODENOSCOPY (EGD);  Surgeon: Irene Shipper, MD;  Location: [facility] ENDOSCOPY;  Service: Endoscopy;  Laterality: N/A;  . Ovary surgery      ovarian cancer  . Cardiac surgery    . Left heart catheterization with coronary angiogram N/A 12/15/2011    Procedure: LEFT HEART CATHETERIZATION WITH CORONARY ANGIOGRAM;  Surgeon: Burnell Blanks, MD;  Location: [facility] CATH LAB;  Service: Cardiovascular;  Laterality: N/A;  . Left heart catheterization with coronary angiogram N/A 01/10/2012    Procedure: LEFT HEART  CATHETERIZATION WITH CORONARY ANGIOGRAM;  Surgeon: Peter M Martinique, MD;  Location: [facility] CATH LAB;  Service: Cardiovascular;  Laterality: N/A;  . Left heart catheterization with coronary angiogram N/A 06/08/2012    Procedure: LEFT HEART CATHETERIZATION WITH CORONARY ANGIOGRAM;  Surgeon: Burnell Blanks, MD;  Location: [facility] CATH LAB;  Service: Cardiovascular;  Laterality: N/A;  . Shuntogram N/A 10/15/2013    Procedure: Fistulogram;  Surgeon: Serafina Mitchell, MD;  Location: [facility] CATH LAB;  Service: Cardiovascular;  Laterality: N/A;  . Left heart catheterization with coronary/graft angiogram N/A 12/10/2013    Procedure: LEFT HEART CATHETERIZATION WITH Beatrix Fetters;  Surgeon: [name], MD;  Location: [facility] CATH LAB;  Service: Cardiovascular;  Laterality: N/A;   Family History  Problem Relation Age of Onset  . Other      noncontributory for early CAD  . Heart disease Mother     Heart Disease before age 49  . Hyperlipidemia Mother   . Hypertension Mother   . Diabetes Mother   . Heart attack Mother   . Heart disease Father     Heart Disease before age 74  . Hyperlipidemia Father   . Hypertension Father   . Diabetes Father   . Diabetes Sister   . Hypertension Sister   . Diabetes Brother   . Hyperlipidemia Brother   . Heart attack Brother   . Colon cancer Neg Hx   . Esophageal cancer Neg Hx   . Liver disease Neg Hx   . Kidney disease Neg Hx   . Colon polyps Neg Hx   . Hypertension Sister   . Heart attack Brother    History  Substance Use Topics  . Smoking status: Never Smoker   . Smokeless tobacco: Never Used  . Alcohol Use: No   OB History    No data available     Review of Systems  Constitutional: Negative for fever.  Respiratory: Positive for shortness of breath. Negative for cough and chest tightness.   Cardiovascular: Positive for leg swelling. Negative for chest pain.  Gastrointestinal: Negative for nausea, vomiting and abdominal pain.  Genitourinary:  Negative for dysuria.  Neurological: Negative for headaches.  All other systems reviewed and are negative.     Allergies  Aspirin; Contrast media; Iron; Macrodantin; Penicillins; Plavix; Bactrim; Sulfa antibiotics; Venofer; Dexilant; Morphine and related; Prilosec; Levaquin; and Protonix  Home Medications   Prior to Admission medications   Medication Sig Start Date End Date Taking? Authorizing Provider  acetaminophen (TYLENOL) 325 MG tablet Take 2 tablets (650 mg total) by mouth every 6 (six) hours as needed for mild pain (mild pain). 10/02/14  Yes Erline Hau, MD  ALPRAZolam Duanne Moron) 0.5 MG tablet Take 0.5 mg by mouth 3 (three) times daily.   Yes Historical Provider, MD  amLODipine (NORVASC) 10 MG tablet Take 1 tablet (10 mg total) by mouth  daily. 12/17/13  Yes Lendon Colonel, NP  aspirin EC 81 MG tablet Take 81 mg by mouth daily.   Yes Historical Provider, MD  benzonatate (TESSALON PERLES) 100 MG capsule Take 1 capsule (100 mg total) by mouth 3 (three) times daily as needed for cough. 10/02/14  Yes Erline Hau, MD  cloNIDine (CATAPRES) 0.3 MG tablet Take 1 tablet (0.3 mg total) by mouth 2 (two) times daily. 12/19/14  Yes Herminio Commons, MD  dextromethorphan-guaiFENesin [facility] DM) 30-600 MG per 12 hr tablet Take 1 tablet by mouth daily. Patient taking differently: Take 1 tablet by mouth 3 (three) times daily as needed.  09/29/14  Yes Fredia Sorrow, MD  fluticasone (FLONASE) 50 MCG/ACT nasal spray Place 2 sprays into the nose daily as needed for allergies.    Yes Historical Provider, MD  folic acid-vitamin b complex-vitamin c-selenium-zinc (DIALYVITE) 3 MG TABS Take 1 tablet by mouth daily.   Yes Historical Provider, MD  hydrALAZINE (APRESOLINE) 50 MG tablet Take 1 tablet (50 mg total) by mouth 3 (three) times daily. 12/19/14  Yes Herminio Commons, MD  isosorbide dinitrate (ISORDIL) 30 MG tablet Take 1 tablet (30 mg total) by mouth 3 (three) times daily.  10/02/14  Yes Erline Hau, MD  labetalol (NORMODYNE) 200 MG tablet Take 200 mg by mouth 2 (two) times daily.   Yes Historical Provider, MD  lanthanum (FOSRENOL) 1000 MG chewable tablet Chew 1,000 mg by mouth 3 (three) times daily after meals.   Yes Historical Provider, MD  lidocaine-prilocaine (EMLA) cream Apply 1 application topically daily as needed (fo access).  11/29/14  Yes Historical Provider, MD  loratadine (CLARITIN) 10 MG tablet Take 10 mg by mouth daily.    Yes Historical Provider, MD  nitroGLYCERIN (NITROSTAT) 0.4 MG SL tablet Place 1 tablet (0.4 mg total) under the tongue every 5 (five) minutes x 3 doses as needed for chest pain. 06/23/14  Yes Herminio Commons, MD  omeprazole (PRILOSEC) 20 MG capsule Take 1 capsule (20 mg total) by mouth daily. 02/10/14  Yes Ladene Artist, MD  polyethylene glycol [facility] / Floria Raveling) packet Take 17 g by mouth daily. Patient taking differently: Take 17 g by mouth daily as needed for mild constipation or moderate constipation.  10/02/14  Yes Estela Leonie Green, MD  SENSIPAR 30 MG tablet Take 30 mg by mouth daily with supper.  12/21/12  Yes Historical Provider, MD  simvastatin (ZOCOR) 20 MG tablet Take 1 tablet (20 mg total) by mouth at bedtime. 11/04/14  Yes Herminio Commons, MD  tetrahydrozoline 0.05 % ophthalmic solution Place 1 drop into both eyes 2 (two) times daily as needed (irritation).   Yes Historical Provider, MD   BP 198/71 mmHg  Pulse 71  Resp 13  SpO2 95% Physical Exam  Constitutional: She is oriented to person, place, and time. No distress.  HENT:  Head: Normocephalic and atraumatic.  Eyes: Pupils are equal, round, and reactive to light.  Cardiovascular: Normal rate, regular rhythm and normal heart sounds.   Fistula left upper extremity  Pulmonary/Chest: Effort normal. No respiratory distress. She has no wheezes.  Occasional expiratory squeak otherwise clear  Abdominal: Soft. Bowel sounds are normal. There is no  tenderness. There is no rebound.  Musculoskeletal: She exhibits edema.  2+ lower extremity edema  Neurological: She is alert and oriented to person, place, and time.  Skin: Skin is warm and dry.  Psychiatric: She has a normal mood and affect.  Nursing note and vitals reviewed.   ED Course  Procedures (including critical care time) Labs Review Labs Reviewed  CBC WITH DIFFERENTIAL/PLATELET - Abnormal; Notable for the following:    RBC 3.07 (*)    Hemoglobin 9.3 (*)    HCT 30.2 (*)    RDW 16.9 (*)    Eosinophils Relative 11 (*)    Eosinophils Absolute 0.8 (*)    All other components within normal limits  BASIC METABOLIC PANEL - Abnormal; Notable for the following:    Glucose, Bld 131 (*)    BUN 66 (*)    Creatinine, Ser 12.09 (*)    GFR calc non Af Amer 3 (*)    GFR calc Af Amer 3 (*)    Anion gap 17 (*)    All other components within normal limits  TROPONIN I    Imaging Review Dg Chest 2 View  12/22/2014   CLINICAL DATA:  Shortness of breath woke her up. Dialysis due today. Bilateral lower extremity edema.  EXAM: CHEST  2 VIEW  COMPARISON:  12/13/2014  FINDINGS: Borderline heart size with normal pulmonary vascularity. Slight interstitial changes in the lung bases may represent early interstitial edema. No evidence of airspace disease. Linear atelectasis or scarring in the lung bases. No blunting of costophrenic angles. Small amount of fluid in the major fissures. Emphysematous changes in the lungs. Calcified and tortuous aorta.  IMPRESSION: Borderline heart size suggestion of early interstitial edema. Small amount of fluid in the fissures. No focal consolidation.   Electronically Signed   By: Lucienne Capers M.D.   On: 12/22/2014 04:19     EKG Interpretation   Date/Time:  Monday December 22 2014 03:27:31 EDT Ventricular Rate:  71 PR Interval:  154 QRS Duration: 105 QT Interval:  439 QTC Calculation: 477 R Axis:   27 Text Interpretation:  Sinus rhythm Probable LVH with  secondary repol abnrm  No significant change since last tracing Confirmed by Bessye Stith  MD, Jasdeep Kepner  [id number]) on 12/22/2014 3:34:07 AM      MDM   Final diagnoses:  Shortness of breath   Patient presents with shortness of breath. Awoke her from sleeping. Denies any infectious symptoms. Nontoxic on exam. Satting 94% on room air. Is in no acute respiratory distress. Appears somewhat volume overloaded. This may be related to her needing to have dialysis later today. Lab work is largely reassuring. Potassium is normal. Creatinine and BUN are elevated as to be expected. Troponin is negative. EKG is nonischemic and unchanged. Chest x-ray shows evidence of early interstitial edema. Again this is likely related to the patient's need to have dialysis. Patient is requesting a breathing treatment. She received 1 DuoNeb with no improvement of the intermittent squeaks on pulmonary exam. Suspect patient shortness of breath is related to mild volume overload. No indication for emergent dialysis. She is to follow with dialysis as scheduled.  After history, exam, and medical workup I feel the patient has been appropriately medically screened and is safe for discharge home. Pertinent diagnoses were discussed with the patient. Patient was given return precautions.      Merryl Hacker, MD 12/22/14 [phone]

## 2014-12-30 ENCOUNTER — Encounter (INDEPENDENT_AMBULATORY_CARE_PROVIDER_SITE_OTHER): Payer: Self-pay | Admitting: *Deleted

## 2014-12-30 ENCOUNTER — Encounter (INDEPENDENT_AMBULATORY_CARE_PROVIDER_SITE_OTHER): Payer: Self-pay | Admitting: Internal Medicine

## 2014-12-30 ENCOUNTER — Ambulatory Visit (INDEPENDENT_AMBULATORY_CARE_PROVIDER_SITE_OTHER): Payer: Medicare Other | Admitting: Internal Medicine

## 2014-12-30 VITALS — BP 134/58 | HR 64 | Temp 97.8°F | Ht 61.0 in | Wt 143.3 lb

## 2014-12-30 DIAGNOSIS — R1013 Epigastric pain: Secondary | ICD-10-CM

## 2014-12-30 NOTE — Patient Instructions (Signed)
US abdomen. Continue the Omeprazole.

## 2014-12-30 NOTE — Progress Notes (Addendum)
Subjective:    Patient ID: Shawna [name], female    DOB: 1941-01-16, 74 y.o.   MRN: [id number]  HPI Referred to our office by Dr. Nadara Mustard for abdominal pain. The pain was located in her upper abdomen. Pain off and on for about a year.  She says it is better. She is 50% better.  She may not have the pain for a week and sometimes will have it everyday.  The pain is not related to eating.  There is no pain now.  Sometimes she has lower abdominal pain. She has had this pain ever since her last cardiac catherization in June of 2015.  Her appetite is okay.  She says for the last month her appetite has not been good.  She was in the hospital one month ago for SOB.  EGD 03/26/2013 EGD: melena Dr. Henrene Pastor: Non bleeding duodenal AVM status post APC. Incidental esophageal stricture.   Her last colonoscopy was in January 2013 by Dr. Britta Mccreedy.   Anemia, Hemocult +.  Two polyps in the cecum, Sessile polyp in the descending colon, Anastomosis.  Biopsy: All tubular adenoma. 11/10/2014 WBC 8.2, H and H 10.5 and 33.2 Hx of ESRD and takes dialysis M-W-F.  Shunt in left upper arm. Dialysis x 5 yrs Hx ovarian cancer in 1992 Hx of cardiac stents x 4 per patient. Has been on [place] in the past CABG in 2013.  CBC    Component Value Date/Time   WBC 7.9 12/22/2014 0342   RBC 3.07* 12/22/2014 0342   HGB 9.3* 12/22/2014 0342   HCT 30.2* 12/22/2014 0342   PLT 300 12/22/2014 0342   MCV 98.4 12/22/2014 0342   MCH 30.3 12/22/2014 0342   MCHC 30.8 12/22/2014 0342   RDW 16.9* 12/22/2014 0342   LYMPHSABS 1.0 12/22/2014 0342   MONOABS 0.6 12/22/2014 0342   EOSABS 0.8* 12/22/2014 0342   BASOSABS 0.1 12/22/2014 0342    Current Outpatient Prescriptions  Medication Sig Dispense Refill  . acetaminophen (TYLENOL) 325 MG tablet Take 2 tablets (650 mg total) by mouth every 6 (six) hours as needed for mild pain (mild pain).    Marland Kitchen ALPRAZolam (XANAX) 0.5 MG tablet Take 0.5 mg by mouth 3 (three) times daily.    Marland Kitchen amLODipine  (NORVASC) 10 MG tablet Take 1 tablet (10 mg total) by mouth daily. 30 tablet 6  . aspirin EC 81 MG tablet Take 81 mg by mouth daily.    . benzonatate (TESSALON PERLES) 100 MG capsule Take 1 capsule (100 mg total) by mouth 3 (three) times daily as needed for cough. 20 capsule 0  . cloNIDine (CATAPRES) 0.3 MG tablet Take 1 tablet (0.3 mg total) by mouth 2 (two) times daily. 60 tablet 6  . dextromethorphan-guaiFENesin (MUCINEX DM) 30-600 MG per 12 hr tablet Take 1 tablet by mouth daily. (Patient taking differently: Take 1 tablet by mouth 3 (three) times daily as needed. ) 14 tablet 1  . fluticasone (FLONASE) 50 MCG/ACT nasal spray Place 2 sprays into the nose daily as needed for allergies.     . folic acid-vitamin b complex-vitamin c-selenium-zinc (DIALYVITE) 3 MG TABS Take 1 tablet by mouth daily.    . hydrALAZINE (APRESOLINE) 50 MG tablet Take 1 tablet (50 mg total) by mouth 3 (three) times daily. 90 tablet 6  . isosorbide dinitrate (ISORDIL) 30 MG tablet Take 1 tablet (30 mg total) by mouth 3 (three) times daily. 90 tablet 1  . labetalol (NORMODYNE) 200 MG tablet Take 200 mg  by mouth 2 (two) times daily.    Marland Kitchen lanthanum (FOSRENOL) 1000 MG chewable tablet Chew 1,000 mg by mouth 3 (three) times daily after meals.    . lidocaine-prilocaine (EMLA) cream Apply 1 application topically daily as needed (fo access).     Marland Kitchen loratadine (CLARITIN) 10 MG tablet Take 10 mg by mouth daily.     . nitroGLYCERIN (NITROSTAT) 0.4 MG SL tablet Place 1 tablet (0.4 mg total) under the tongue every 5 (five) minutes x 3 doses as needed for chest pain. 25 tablet 3  . omeprazole (PRILOSEC) 20 MG capsule Take 1 capsule (20 mg total) by mouth daily. 30 capsule 11  . polyethylene glycol (MIRALAX / GLYCOLAX) packet Take 17 g by mouth daily. (Patient taking differently: Take 17 g by mouth daily as needed for mild constipation or moderate constipation. ) 14 each 0  . SENSIPAR 30 MG tablet Take 30 mg by mouth daily with supper.     .  simvastatin (ZOCOR) 20 MG tablet Take 1 tablet (20 mg total) by mouth at bedtime. 30 tablet 3  . tetrahydrozoline 0.05 % ophthalmic solution Place 1 drop into both eyes 2 (two) times daily as needed (irritation).     No current facility-administered medications for this visit.   CMP Latest Ref Rng 12/22/2014 12/13/2014 12/08/2014  Glucose 65 - 99 mg/dL 131(H) 110(H) 163(H)  BUN 6 - 20 mg/dL 66(H) 21(H) 23(H)  Creatinine 0.44 - 1.00 mg/dL 12.09(H) 6.79(H) 5.99(H)  Sodium 135 - 145 mmol/L 143 139 136  Potassium 3.5 - 5.1 mmol/L 4.9 4.0 3.3(L)  Chloride 101 - 111 mmol/L 103 99(L) 96(L)  CO2 22 - 32 mmol/L 23 28 28   Calcium 8.9 - 10.3 mg/dL 9.8 9.5 9.2  Total Protein 6.5 - 8.1 g/dL - 6.0(L) -  Total Bilirubin 0.3 - 1.2 mg/dL - 1.0 -  Alkaline Phos 38 - 126 U/L - 83 -  AST 15 - 41 U/L - 19 -  ALT 14 - 54 U/L - 13(L) -      Review of Systems Past Medical History  Diagnosis Date  . Chronic bronchitis   . GERD (gastroesophageal reflux disease)   . PUD (peptic ulcer disease)   . History of lower GI bleeding   . Arthritis   . History of gout   . CAD (coronary artery disease)     a. 12/2011 NSTEMI/Cath/PCI LCX (2.25x14 Resolute DES) & D1 (2.25x22 Resolute DES);  b. 01/2012 Cath/PCI: LM 30, LAD 30p, 40-43m, D1 stent ok, 99 in sm branch of diag, LCX patent stent, OM1 20, RCA 95 ost (4.0x12 Promus DES), EF 55%;  c. 04/2012 Lexi Cardiolite  EF 48%, small area of scar @ base/mid inflat wall with mild peri-infarct ischemia.; CABG 12/4  . High cholesterol 12/2011  . Pneumonia ~ 2009  . Iron deficiency anemia   . TIA (transient ischemic attack)   . Anxiety   . History of blood transfusion 07/2011; 12/2011; 01/2012 X 2; 04/2012  . Carotid artery disease     a. [id number] LICA, [id number]   . Mitral regurgitation     a. Moderate by echo, 02/2012  . Myocardial infarction   . Chronic diastolic CHF (congestive heart failure)     a. 02/2012 Echo EF 60-65%, nl wall motion, Gr 1 DD, mod MR  . Hypertension   . AVM  (arteriovenous malformation) of colon   . Esophageal stricture   . Ovarian cancer 1992  . Colon cancer 1992  . ESRD on  hemodialysis     ESRD due to HTN, started dialysis 2011 and gets HD at [facility] with Dr Hinda Lenis on MWF schedule.  Access is LUA AVF as of Sept 2014.     Past Surgical History  Procedure Laterality Date  . Abdominal hysterectomy  1992  . Appendectomy  06/1990  . Tubal ligation  1980's  . Av fistula placement  07/2009    left upper arm  . Thrombectomy / arteriovenous graft revision  2011    left upper arm  . Colon resection  1992  . Esophagogastroduodenoscopy  01/20/2012    Procedure: ESOPHAGOGASTRODUODENOSCOPY (EGD);  Surgeon: Ladene Artist, MD,FACG;  Location: [facility] ENDOSCOPY;  Service: Endoscopy;  Laterality: N/A;  . Dilation and curettage of uterus    . Coronary angioplasty with stent placement  12/15/11    "2"  . Coronary angioplasty with stent placement  y/2013    "1; makes total of 3" (05/02/2012)  . Coronary artery bypass graft  06/13/2012    Procedure: CORONARY ARTERY BYPASS GRAFTING (CABG);  Surgeon: Grace Isaac, MD;  Location: [place];  Service: Open Heart Surgery;  Laterality: N/A;  cabg x four;  using left internal mammary artery, and left leg greater saphenous vein harvested endoscopically  . Intraoperative transesophageal echocardiogram  06/13/2012    Procedure: INTRAOPERATIVE TRANSESOPHAGEAL ECHOCARDIOGRAM;  Surgeon: Grace Isaac, MD;  Location: [place];  Service: Open Heart Surgery;  Laterality: N/A;  . Esophagogastroduodenoscopy N/A 03/26/2013    Procedure: ESOPHAGOGASTRODUODENOSCOPY (EGD);  Surgeon: Irene Shipper, MD;  Location: [facility] ENDOSCOPY;  Service: Endoscopy;  Laterality: N/A;  . Ovary surgery      ovarian cancer  . Cardiac surgery    . Left heart catheterization with coronary angiogram N/A 12/15/2011    Procedure: LEFT HEART CATHETERIZATION WITH CORONARY ANGIOGRAM;  Surgeon: Burnell Blanks, MD;  Location: [facility] CATH LAB;  Service:  Cardiovascular;  Laterality: N/A;  . Left heart catheterization with coronary angiogram N/A 01/10/2012    Procedure: LEFT HEART CATHETERIZATION WITH CORONARY ANGIOGRAM;  Surgeon: Peter M Martinique, MD;  Location: [facility] CATH LAB;  Service: Cardiovascular;  Laterality: N/A;  . Left heart catheterization with coronary angiogram N/A 06/08/2012    Procedure: LEFT HEART CATHETERIZATION WITH CORONARY ANGIOGRAM;  Surgeon: Burnell Blanks, MD;  Location: [facility] CATH LAB;  Service: Cardiovascular;  Laterality: N/A;  . Shuntogram N/A 10/15/2013    Procedure: Fistulogram;  Surgeon: Serafina Mitchell, MD;  Location: [facility] CATH LAB;  Service: Cardiovascular;  Laterality: N/A;  . Left heart catheterization with coronary/graft angiogram N/A 12/10/2013    Procedure: LEFT HEART CATHETERIZATION WITH Beatrix Fetters;  Surgeon: [name], MD;  Location: [facility] CATH LAB;  Service: Cardiovascular;  Laterality: N/A;    Allergies  Allergen Reactions  . Aspirin Other (See Comments)    Mess up her stomach; "makes my bowels have blood in them". Takes 81 mg EC Aspirin   . Contrast Media [Iodinated Diagnostic Agents] Itching  . Iron Itching and Other (See Comments)    "they gave me iron in dialysis; had to give me Benadryl cause I had to have the iron" (05/02/2012)  . Macrodantin [Nitrofurantoin Macrocrystal] Other (See Comments)    "broke me out in big old knots all over my body; had to go to ER"  . Penicillins Other (See Comments)    "makes me real weak when I take it; like I'll pass out"  . Plavix [Clopidogrel Bisulfate] Rash  . Bactrim [Sulfamethoxazole-Trimethoprim] Rash  . Sulfa  Antibiotics Rash  . Venofer [Ferric Oxide] Itching    Patient reports using Benadryl prior to doses as [facility]  . Dexilant [Dexlansoprazole] Other (See Comments)    Upset stomach  . Morphine And Related     Itching in feet  . Prilosec [Omeprazole] Other (See Comments)    "back spasms"  . Levaquin [Levofloxacin In D5w] Rash    . Protonix [Pantoprazole Sodium] Rash    Current Outpatient Prescriptions on File Prior to Visit  Medication Sig Dispense Refill  . acetaminophen (TYLENOL) 325 MG tablet Take 2 tablets (650 mg total) by mouth every 6 (six) hours as needed for mild pain (mild pain).    Marland Kitchen ALPRAZolam (XANAX) 0.5 MG tablet Take 0.5 mg by mouth 3 (three) times daily.    Marland Kitchen amLODipine (NORVASC) 10 MG tablet Take 1 tablet (10 mg total) by mouth daily. 30 tablet 6  . aspirin EC 81 MG tablet Take 81 mg by mouth daily.    . benzonatate (TESSALON PERLES) 100 MG capsule Take 1 capsule (100 mg total) by mouth 3 (three) times daily as needed for cough. 20 capsule 0  . cloNIDine (CATAPRES) 0.3 MG tablet Take 1 tablet (0.3 mg total) by mouth 2 (two) times daily. 60 tablet 6  . dextromethorphan-guaiFENesin (MUCINEX DM) 30-600 MG per 12 hr tablet Take 1 tablet by mouth daily. (Patient taking differently: Take 1 tablet by mouth 3 (three) times daily as needed. ) 14 tablet 1  . fluticasone (FLONASE) 50 MCG/ACT nasal spray Place 2 sprays into the nose daily as needed for allergies.     . folic acid-vitamin b complex-vitamin c-selenium-zinc (DIALYVITE) 3 MG TABS Take 1 tablet by mouth daily.    . hydrALAZINE (APRESOLINE) 50 MG tablet Take 1 tablet (50 mg total) by mouth 3 (three) times daily. 90 tablet 6  . isosorbide dinitrate (ISORDIL) 30 MG tablet Take 1 tablet (30 mg total) by mouth 3 (three) times daily. 90 tablet 1  . labetalol (NORMODYNE) 200 MG tablet Take 200 mg by mouth 2 (two) times daily.    Marland Kitchen lanthanum (FOSRENOL) 1000 MG chewable tablet Chew 1,000 mg by mouth 3 (three) times daily after meals.    . lidocaine-prilocaine (EMLA) cream Apply 1 application topically daily as needed (fo access).     Marland Kitchen loratadine (CLARITIN) 10 MG tablet Take 10 mg by mouth daily.     . nitroGLYCERIN (NITROSTAT) 0.4 MG SL tablet Place 1 tablet (0.4 mg total) under the tongue every 5 (five) minutes x 3 doses as needed for chest pain. 25 tablet 3   . omeprazole (PRILOSEC) 20 MG capsule Take 1 capsule (20 mg total) by mouth daily. 30 capsule 11  . polyethylene glycol (MIRALAX / GLYCOLAX) packet Take 17 g by mouth daily. (Patient taking differently: Take 17 g by mouth daily as needed for mild constipation or moderate constipation. ) 14 each 0  . SENSIPAR 30 MG tablet Take 30 mg by mouth daily with supper.     . simvastatin (ZOCOR) 20 MG tablet Take 1 tablet (20 mg total) by mouth at bedtime. 30 tablet 3  . tetrahydrozoline 0.05 % ophthalmic solution Place 1 drop into both eyes 2 (two) times daily as needed (irritation).     No current facility-administered medications on file prior to visit.   R    Objective:   Physical ExamBlood pressure 134/58, pulse 64, temperature 97.8 F (36.6 C), height 5\' 1"  (1.549 m), weight 143 lb 4.8 oz (65 kg).  Alert and oriented. Skin warm and dry. Oral mucosa is moist.   . Sclera anicteric, conjunctivae is pink. Thyroid not enlarged. No cervical lymphadenopathy. Lungs clear. Heart regular rate and rhythm.  Abdomen is soft. Bowel sounds are positive. No hepatomegaly. No abdominal masses felt. No tenderness.  No edema to lower extremities.         Assessment & Plan:  Epigastric pain sporadically. No blood thinner at present,. Hx of UGIB.  US abdomen today.

## 2015-01-06 ENCOUNTER — Ambulatory Visit (HOSPITAL_COMMUNITY)
Admission: RE | Admit: 2015-01-06 | Discharge: 2015-01-06 | Disposition: A | Discharge status: Home / Self Care | Payer: Medicare Other | Source: Ambulatory Visit | Attending: Internal Medicine | Admitting: Internal Medicine

## 2015-01-06 DIAGNOSIS — R932 Abnormal findings on diagnostic imaging of liver and biliary tract: Secondary | ICD-10-CM | POA: Diagnosis not present

## 2015-01-06 DIAGNOSIS — N189 Chronic kidney disease, unspecified: Secondary | ICD-10-CM | POA: Insufficient documentation

## 2015-01-06 DIAGNOSIS — N281 Cyst of kidney, acquired: Secondary | ICD-10-CM | POA: Diagnosis not present

## 2015-01-06 DIAGNOSIS — R1013 Epigastric pain: Secondary | ICD-10-CM | POA: Diagnosis not present

## 2015-01-06 DIAGNOSIS — I129 Hypertensive chronic kidney disease with stage 1 through stage 4 chronic kidney disease, or unspecified chronic kidney disease: Secondary | ICD-10-CM | POA: Diagnosis not present

## 2015-01-19 ENCOUNTER — Telehealth: Payer: Self-pay | Admitting: Cardiovascular Disease

## 2015-01-19 ENCOUNTER — Ambulatory Visit (INDEPENDENT_AMBULATORY_CARE_PROVIDER_SITE_OTHER): Payer: Medicare Other | Admitting: Cardiovascular Disease

## 2015-01-19 VITALS — BP 170/100 | HR 76 | Ht 61.0 in | Wt 141.0 lb

## 2015-01-19 DIAGNOSIS — I739 Peripheral vascular disease, unspecified: Secondary | ICD-10-CM

## 2015-01-19 DIAGNOSIS — I779 Disorder of arteries and arterioles, unspecified: Secondary | ICD-10-CM

## 2015-01-19 DIAGNOSIS — N186 End stage renal disease: Secondary | ICD-10-CM

## 2015-01-19 DIAGNOSIS — I340 Nonrheumatic mitral (valve) insufficiency: Secondary | ICD-10-CM

## 2015-01-19 DIAGNOSIS — E7800 Pure hypercholesterolemia, unspecified: Secondary | ICD-10-CM

## 2015-01-19 DIAGNOSIS — Z951 Presence of aortocoronary bypass graft: Secondary | ICD-10-CM | POA: Diagnosis not present

## 2015-01-19 DIAGNOSIS — I5032 Chronic diastolic (congestive) heart failure: Secondary | ICD-10-CM

## 2015-01-19 DIAGNOSIS — R000 Tachycardia, unspecified: Secondary | ICD-10-CM

## 2015-01-19 DIAGNOSIS — I10 Essential (primary) hypertension: Secondary | ICD-10-CM | POA: Diagnosis not present

## 2015-01-19 DIAGNOSIS — I25768 Atherosclerosis of bypass graft of coronary artery of transplanted heart with other forms of angina pectoris: Secondary | ICD-10-CM

## 2015-01-19 MED ORDER — HYDRALAZINE HCL 50 MG PO TABS: 75.0000 mg | ORAL_TABLET | Freq: Three times a day (TID) | ORAL | Status: DC

## 2015-01-19 NOTE — Telephone Encounter (Signed)
Pt in office awaiting evaluation by Dr.Koneswaran

## 2015-01-19 NOTE — Telephone Encounter (Signed)
Will forward to Dr. Koneswaran  

## 2015-01-19 NOTE — Progress Notes (Signed)
Patient ID: Shawna [name], female   DOB: [birth year], 74 y.o.   MRN: [id number]      SUBJECTIVE: Shawna [name] was added onto my schedule today as her heart rate was reportedly 130-140 bpm during dialysis. They apparently did not have the capability of performing an ECG. She said she felt "funny". She was wheezing this morning and used albuterol and that too made her feel "funny". She has had intermittent chest tightness. She is no longer taking amlodipine because it led to leg and ankle swelling.  In summary, she has a history of coronary artery disease status post CABG, malignant hypertension, hyperlipidemia, chronic diastolic heart failure, and end-stage renal disease on hemodialysis.   She had a normal nuclear cardiac stress test on 08/25/14, LVEF 57%. Coronary angiography on 12/10/13 demonstrated a patent left main coronary artery, focal significant disease in the mid to distal left anterior descending artery, patent diagonal branch, LIMA to LAD was widely patent, severe restenosis of the proximal left circumflex artery stent, patent SVG to circumflex, 70% proximal ramus stenosis, widely patent SVG to ramus, occluded stent at the ostium of the right coronary artery, and widely patent SVG to distal RCA.  Echocardiogram on 11/27/13 demonstrated normal left ventricular systolic function, EF 0000000, moderate LVH, grade 1 diastolic dysfunction, elevated filling pressures, and mild to moderate mitral regurgitation.   Review of Systems: As per "subjective", otherwise negative.  Allergies  Allergen Reactions  . Aspirin Other (See Comments)    Mess up her stomach; "makes my bowels have blood in them". Takes 81 mg EC Aspirin   . Contrast Media [Iodinated Diagnostic Agents] Itching  . Iron Itching and Other (See Comments)    "they gave me iron in dialysis; had to give me Benadryl cause I had to have the iron" (05/02/2012)  . Macrodantin [Nitrofurantoin Macrocrystal] Other (See Comments)    "broke me out  in big old knots all over my body; had to go to ER"  . Penicillins Other (See Comments)    "makes me real weak when I take it; like I'll pass out"  . Plavix [Clopidogrel Bisulfate] Rash  . Bactrim [Sulfamethoxazole-Trimethoprim] Rash  . Sulfa Antibiotics Rash  . Venofer [Ferric Oxide] Itching    Patient reports using Benadryl prior to doses as Shawna [name]  . Dexilant [Dexlansoprazole] Other (See Comments)    Upset stomach  . Morphine And Related     Itching in feet  . Prilosec [Omeprazole] Other (See Comments)    "back spasms"  . Levaquin [Levofloxacin In D5w] Rash  . Protonix [Pantoprazole Sodium] Rash    Current Outpatient Prescriptions  Medication Sig Dispense Refill  . acetaminophen (TYLENOL) 325 MG tablet Take 2 tablets (650 mg total) by mouth every 6 (six) hours as needed for mild pain (mild pain).    Marland Kitchen ALPRAZolam (XANAX) 0.5 MG tablet Take 0.5 mg by mouth 3 (three) times daily.    Marland Kitchen aspirin EC 81 MG tablet Take 81 mg by mouth daily.    . benzonatate (TESSALON PERLES) 100 MG capsule Take 1 capsule (100 mg total) by mouth 3 (three) times daily as needed for cough. 20 capsule 0  . cloNIDine (CATAPRES) 0.3 MG tablet Take 1 tablet (0.3 mg total) by mouth 2 (two) times daily. 60 tablet 6  . dextromethorphan-guaiFENesin (MUCINEX DM) 30-600 MG per 12 hr tablet Take 1 tablet by mouth daily. (Patient taking differently: Take 1 tablet by mouth 3 (three) times daily as needed. ) 14 tablet 1  .  fluticasone (FLONASE) 50 MCG/ACT nasal spray Place 2 sprays into the nose daily as needed for allergies.     . folic acid-vitamin b complex-vitamin c-selenium-zinc (DIALYVITE) 3 MG TABS Take 1 tablet by mouth daily.    . hydrALAZINE (APRESOLINE) 50 MG tablet Take 1 tablet (50 mg total) by mouth 3 (three) times daily. 90 tablet 6  . isosorbide dinitrate (ISORDIL) 30 MG tablet Take 1 tablet (30 mg total) by mouth 3 (three) times daily. 90 tablet 1  . labetalol (NORMODYNE) 200 MG tablet Take 200 mg by  mouth 2 (two) times daily.    Marland Kitchen lanthanum (FOSRENOL) 1000 MG chewable tablet Chew 1,000 mg by mouth 3 (three) times daily after meals.    . lidocaine-prilocaine (EMLA) cream Apply 1 application topically daily as needed (fo access).     Marland Kitchen loratadine (CLARITIN) 10 MG tablet Take 10 mg by mouth daily.     . nitroGLYCERIN (NITROSTAT) 0.4 MG SL tablet Place 1 tablet (0.4 mg total) under the tongue every 5 (five) minutes x 3 doses as needed for chest pain. 25 tablet 3  . omeprazole (PRILOSEC) 20 MG capsule Take 1 capsule (20 mg total) by mouth daily. 30 capsule 11  . polyethylene glycol (MIRALAX / GLYCOLAX) packet Take 17 g by mouth daily. (Patient taking differently: Take 17 g by mouth daily as needed for mild constipation or moderate constipation. ) 14 each 0  . SENSIPAR 30 MG tablet Take 30 mg by mouth daily with supper.     . simvastatin (ZOCOR) 20 MG tablet Take 1 tablet (20 mg total) by mouth at bedtime. 30 tablet 3  . tetrahydrozoline 0.05 % ophthalmic solution Place 1 drop into both eyes 2 (two) times daily as needed (irritation).    Marland Kitchen amLODipine (NORVASC) 10 MG tablet Take 1 tablet (10 mg total) by mouth daily. (Patient not taking: Reported on 01/19/2015) 30 tablet 6   No current facility-administered medications for this visit.    Past Medical History  Diagnosis Date  . Chronic bronchitis   . GERD (gastroesophageal reflux disease)   . PUD (peptic ulcer disease)   . History of lower GI bleeding   . Arthritis   . History of gout   . CAD (coronary artery disease)     a. 12/2011 NSTEMI/Cath/PCI LCX (2.25x14 Resolute DES) & D1 (2.25x22 Resolute DES);  b. 01/2012 Cath/PCI: LM 30, LAD 30p, 40-87m, D1 stent ok, 99 in sm branch of diag, LCX patent stent, OM1 20, RCA 95 ost (4.0x12 Promus DES), EF 55%;  c. 04/2012 Lexi Cardiolite  EF 48%, small area of scar @ base/mid inflat wall with mild peri-infarct ischemia.; CABG 12/4  . High cholesterol 12/2011  . Pneumonia ~ 2009  . Iron deficiency anemia     . TIA (transient ischemic attack)   . Anxiety   . History of blood transfusion 07/2011; 12/2011; 01/2012 X 2; 04/2012  . Carotid artery disease     a. [id number] LICA, [id number]   . Mitral regurgitation     a. Moderate by echo, 02/2012  . Myocardial infarction   . Chronic diastolic CHF (congestive heart failure)     a. 02/2012 Echo EF 60-65%, nl wall motion, Gr 1 DD, mod MR  . Hypertension   . AVM (arteriovenous malformation) of colon   . Esophageal stricture   . Ovarian cancer 1992  . Colon cancer 1992  . ESRD on hemodialysis     ESRD due to HTN, started dialysis 2011 and gets  HD at [facility] with Dr Hinda Lenis on MWF schedule.  Access is LUA AVF as of Sept 2014.     Past Surgical History  Procedure Laterality Date  . Abdominal hysterectomy  1992  . Appendectomy  06/1990  . Tubal ligation  1980's  . Av fistula placement  07/2009    left upper arm  . Thrombectomy / arteriovenous graft revision  2011    left upper arm  . Colon resection  1992  . Esophagogastroduodenoscopy  01/20/2012    Procedure: ESOPHAGOGASTRODUODENOSCOPY (EGD);  Surgeon: Ladene Artist, MD,FACG;  Location: [facility] ENDOSCOPY;  Service: Endoscopy;  Laterality: N/A;  . Dilation and curettage of uterus    . Coronary angioplasty with stent placement  12/15/11    "2"  . Coronary angioplasty with stent placement  y/2013    "1; makes total of 3" (05/02/2012)  . Coronary artery bypass graft  06/13/2012    Procedure: CORONARY ARTERY BYPASS GRAFTING (CABG);  Surgeon: Grace Isaac, MD;  Location: [place];  Service: Open Heart Surgery;  Laterality: N/A;  cabg x four;  using left internal mammary artery, and left leg greater saphenous vein harvested endoscopically  . Intraoperative transesophageal echocardiogram  06/13/2012    Procedure: INTRAOPERATIVE TRANSESOPHAGEAL ECHOCARDIOGRAM;  Surgeon: Grace Isaac, MD;  Location: [place];  Service: Open Heart Surgery;  Laterality: N/A;  . Esophagogastroduodenoscopy N/A 03/26/2013    Procedure:  ESOPHAGOGASTRODUODENOSCOPY (EGD);  Surgeon: Irene Shipper, MD;  Location: [facility] ENDOSCOPY;  Service: Endoscopy;  Laterality: N/A;  . Ovary surgery      ovarian cancer  . Cardiac surgery    . Left heart catheterization with coronary angiogram N/A 12/15/2011    Procedure: LEFT HEART CATHETERIZATION WITH CORONARY ANGIOGRAM;  Surgeon: Burnell Blanks, MD;  Location: [facility] CATH LAB;  Service: Cardiovascular;  Laterality: N/A;  . Left heart catheterization with coronary angiogram N/A 01/10/2012    Procedure: LEFT HEART CATHETERIZATION WITH CORONARY ANGIOGRAM;  Surgeon: Peter M Martinique, MD;  Location: [facility] CATH LAB;  Service: Cardiovascular;  Laterality: N/A;  . Left heart catheterization with coronary angiogram N/A 06/08/2012    Procedure: LEFT HEART CATHETERIZATION WITH CORONARY ANGIOGRAM;  Surgeon: Burnell Blanks, MD;  Location: [facility] CATH LAB;  Service: Cardiovascular;  Laterality: N/A;  . Shuntogram N/A 10/15/2013    Procedure: Fistulogram;  Surgeon: Serafina Mitchell, MD;  Location: [facility] CATH LAB;  Service: Cardiovascular;  Laterality: N/A;  . Left heart catheterization with coronary/graft angiogram N/A 12/10/2013    Procedure: LEFT HEART CATHETERIZATION WITH Beatrix Fetters;  Surgeon: [name], MD;  Location: [facility] CATH LAB;  Service: Cardiovascular;  Laterality: N/A;    History   Social History  . Marital Status: Married    Spouse Name: N/A  . Number of Children: N/A  . Years of Education: N/A   Occupational History  . Not on file.   Social History Main Topics  . Smoking status: Never Smoker   . Smokeless tobacco: Never Used  . Alcohol Use: No  . Drug Use: No  . Sexual Activity: Yes    Birth Control/ Protection: Surgical   Other Topics Concern  . Not on file   Social History Narrative   Lives in [place], [place] with husband.  Dialysis pt - mwf.     Filed Vitals:   01/19/15 1618  BP: 170/100  Pulse: 76  Height: 5\' 1"  (1.549 m)  Weight: 141 lb (63.957 kg)  SpO2: 93%      PHYSICAL EXAM  General: NAD HEENT: Normal. Neck: No JVD, no thyromegaly. Lungs: Clear to auscultation bilaterally with normal respiratory effort. CV: Nondisplaced PMI.  Regular rate and rhythm, normal S1/S2, no S3/S4, no murmur. No pretibial or periankle edema.   Abdomen: Soft, no distention.  Neurologic: Alert and oriented x 3.  Psych: Normal affect. Skin: Normal. Musculoskeletal: No gross deformities. Extremities: No clubbing or cyanosis.   ECG: Most recent ECG reviewed.      ASSESSMENT AND PLAN: 1. CAD s/p CABG: She is no longer tachycardic. Unclear what episode earlier today represented (sinus tachycardia?). Normal nuclear stress test (08/25/14). I will continue ASA, labetalol, isosorbide dinitrate, and statin. Needs better BP control.  2. Malignant HTN: Elevated onamlodipine, clonidine, hydralazine, and labetalol. I will increase hydralazine to 75 mg tid. I previously instructed her not to take the afternoon doses on dialysis days when her BP occasionally drops.  3. Hyperlipidemia: Will obtain copy of lipids.  4. Mitral regurgitation: Mild to moderate in 11/2013.   5. Carotid artery stenosis: Less than 40% bilaterally on 08/14/14. Continue surveillance monitoring.   Dispo: f/u 1 month.    Kate Sable, M.D., F.A.C.C.

## 2015-01-19 NOTE — Telephone Encounter (Signed)
Pt is having dialysis right now and her heart rate is around 130-140

## 2015-01-19 NOTE — Patient Instructions (Signed)
Your physician recommends that you schedule a follow-up appointment in: 1 month with Dr Bronson Ing in [place]   STOP Amlodipine due to leg swelling   INCREASE Hydralazine to 75 mg( 1 1/2 tablets)  three times a day     Thank you for choosing [facility] !

## 2015-01-21 ENCOUNTER — Inpatient Hospital Stay (HOSPITAL_COMMUNITY)
Admission: AD | Admit: 2015-01-21 | Discharge: 2015-01-27 | DRG: 252 | Disposition: A | Discharge status: Home / Self Care | Payer: Medicare Other | Source: Ambulatory Visit | Attending: Internal Medicine | Admitting: Internal Medicine

## 2015-01-21 ENCOUNTER — Other Ambulatory Visit (HOSPITAL_COMMUNITY): Payer: Self-pay | Admitting: Nephrology

## 2015-01-21 ENCOUNTER — Inpatient Hospital Stay (HOSPITAL_COMMUNITY): Payer: Medicare Other | Admitting: Certified Registered Nurse Anesthetist

## 2015-01-21 ENCOUNTER — Inpatient Hospital Stay (HOSPITAL_COMMUNITY): Payer: Medicare Other

## 2015-01-21 ENCOUNTER — Encounter (HOSPITAL_COMMUNITY): Payer: Self-pay

## 2015-01-21 ENCOUNTER — Ambulatory Visit (HOSPITAL_COMMUNITY): Admission: RE | Admit: 2015-01-21 | Payer: Medicare Other | Source: Ambulatory Visit

## 2015-01-21 ENCOUNTER — Other Ambulatory Visit: Payer: Self-pay

## 2015-01-21 DIAGNOSIS — I252 Old myocardial infarction: Secondary | ICD-10-CM | POA: Diagnosis not present

## 2015-01-21 DIAGNOSIS — R0602 Shortness of breath: Secondary | ICD-10-CM

## 2015-01-21 DIAGNOSIS — Z85038 Personal history of other malignant neoplasm of large intestine: Secondary | ICD-10-CM

## 2015-01-21 DIAGNOSIS — I120 Hypertensive chronic kidney disease with stage 5 chronic kidney disease or end stage renal disease: Secondary | ICD-10-CM | POA: Diagnosis present

## 2015-01-21 DIAGNOSIS — Z7982 Long term (current) use of aspirin: Secondary | ICD-10-CM | POA: Diagnosis not present

## 2015-01-21 DIAGNOSIS — D631 Anemia in chronic kidney disease: Secondary | ICD-10-CM | POA: Diagnosis present

## 2015-01-21 DIAGNOSIS — Z955 Presence of coronary angioplasty implant and graft: Secondary | ICD-10-CM

## 2015-01-21 DIAGNOSIS — I10 Essential (primary) hypertension: Secondary | ICD-10-CM | POA: Diagnosis not present

## 2015-01-21 DIAGNOSIS — N186 End stage renal disease: Secondary | ICD-10-CM

## 2015-01-21 DIAGNOSIS — J9600 Acute respiratory failure, unspecified whether with hypoxia or hypercapnia: Secondary | ICD-10-CM | POA: Diagnosis present

## 2015-01-21 DIAGNOSIS — Z992 Dependence on renal dialysis: Secondary | ICD-10-CM

## 2015-01-21 DIAGNOSIS — G9340 Encephalopathy, unspecified: Secondary | ICD-10-CM | POA: Diagnosis not present

## 2015-01-21 DIAGNOSIS — E780 Pure hypercholesterolemia: Secondary | ICD-10-CM | POA: Diagnosis present

## 2015-01-21 DIAGNOSIS — K219 Gastro-esophageal reflux disease without esophagitis: Secondary | ICD-10-CM | POA: Diagnosis present

## 2015-01-21 DIAGNOSIS — R0609 Other forms of dyspnea: Secondary | ICD-10-CM | POA: Insufficient documentation

## 2015-01-21 DIAGNOSIS — Z9911 Dependence on respirator [ventilator] status: Secondary | ICD-10-CM | POA: Diagnosis not present

## 2015-01-21 DIAGNOSIS — R4182 Altered mental status, unspecified: Secondary | ICD-10-CM

## 2015-01-21 DIAGNOSIS — E875 Hyperkalemia: Secondary | ICD-10-CM | POA: Diagnosis present

## 2015-01-21 DIAGNOSIS — I340 Nonrheumatic mitral (valve) insufficiency: Secondary | ICD-10-CM | POA: Diagnosis present

## 2015-01-21 DIAGNOSIS — R092 Respiratory arrest: Secondary | ICD-10-CM | POA: Diagnosis present

## 2015-01-21 DIAGNOSIS — Z8673 Personal history of transient ischemic attack (TIA), and cerebral infarction without residual deficits: Secondary | ICD-10-CM | POA: Diagnosis not present

## 2015-01-21 DIAGNOSIS — T404X5A Adverse effect of other synthetic narcotics, initial encounter: Secondary | ICD-10-CM | POA: Diagnosis not present

## 2015-01-21 DIAGNOSIS — J9811 Atelectasis: Secondary | ICD-10-CM | POA: Diagnosis not present

## 2015-01-21 DIAGNOSIS — R0600 Dyspnea, unspecified: Secondary | ICD-10-CM

## 2015-01-21 DIAGNOSIS — I2510 Atherosclerotic heart disease of native coronary artery without angina pectoris: Secondary | ICD-10-CM | POA: Diagnosis present

## 2015-01-21 DIAGNOSIS — I5032 Chronic diastolic (congestive) heart failure: Secondary | ICD-10-CM | POA: Diagnosis present

## 2015-01-21 DIAGNOSIS — Z4659 Encounter for fitting and adjustment of other gastrointestinal appliance and device: Secondary | ICD-10-CM

## 2015-01-21 DIAGNOSIS — Y712 Prosthetic and other implants, materials and accessory cardiovascular devices associated with adverse incidents: Secondary | ICD-10-CM | POA: Diagnosis present

## 2015-01-21 DIAGNOSIS — T424X5A Adverse effect of benzodiazepines, initial encounter: Secondary | ICD-10-CM | POA: Diagnosis not present

## 2015-01-21 DIAGNOSIS — Z8543 Personal history of malignant neoplasm of ovary: Secondary | ICD-10-CM

## 2015-01-21 DIAGNOSIS — T82868A Thrombosis of vascular prosthetic devices, implants and grafts, initial encounter: Principal | ICD-10-CM | POA: Diagnosis present

## 2015-01-21 DIAGNOSIS — J9601 Acute respiratory failure with hypoxia: Secondary | ICD-10-CM | POA: Diagnosis not present

## 2015-01-21 LAB — CBC
HEMATOCRIT: 26.6 % — AB (ref 36.0–46.0)
Hemoglobin: 8.5 g/dL — ABNORMAL LOW (ref 12.0–15.0)
MCH: 30.5 pg (ref 26.0–34.0)
MCHC: 32 g/dL (ref 30.0–36.0)
MCV: 95.3 fL (ref 78.0–100.0)
PLATELETS: 216 10*3/uL (ref 150–400)
RBC: 2.79 MIL/uL — ABNORMAL LOW (ref 3.87–5.11)
RDW: 16.3 % — ABNORMAL HIGH (ref 11.5–15.5)
WBC: 10.8 10*3/uL — ABNORMAL HIGH (ref 4.0–10.5)

## 2015-01-21 LAB — POCT I-STAT 3, ART BLOOD GAS (G3+)
ACID-BASE DEFICIT: 2 mmol/L (ref 0.0–2.0)
Bicarbonate: 23 mEq/L (ref 20.0–24.0)
O2 SAT: 98 %
TCO2: 24 mmol/L (ref 0–100)
pCO2 arterial: 37.8 mmHg (ref 35.0–45.0)
pH, Arterial: 7.389 (ref 7.350–7.450)
pO2, Arterial: 100 mmHg (ref 80.0–100.0)

## 2015-01-21 LAB — BASIC METABOLIC PANEL
ANION GAP: 12 (ref 5–15)
BUN: 54 mg/dL — AB (ref 6–20)
CALCIUM: 8.8 mg/dL — AB (ref 8.9–10.3)
CO2: 25 mmol/L (ref 22–32)
Chloride: 98 mmol/L — ABNORMAL LOW (ref 101–111)
Creatinine, Ser: 12.16 mg/dL — ABNORMAL HIGH (ref 0.44–1.00)
GFR calc Af Amer: 3 mL/min — ABNORMAL LOW (ref >60)
GFR, EST NON AFRICAN AMERICAN: 3 mL/min — AB (ref >60)
Glucose, Bld: 101 mg/dL — ABNORMAL HIGH (ref 65–99)
Potassium: 5.6 mmol/L — ABNORMAL HIGH (ref 3.5–5.1)
Sodium: 135 mmol/L (ref 135–145)

## 2015-01-21 LAB — POCT I-STAT 4, (NA,K, GLUC, HGB,HCT)
Glucose, Bld: 102 mg/dL — ABNORMAL HIGH (ref 65–99)
HCT: 29 % — ABNORMAL LOW (ref 36.0–46.0)
Hemoglobin: 9.9 g/dL — ABNORMAL LOW (ref 12.0–15.0)
POTASSIUM: 4.9 mmol/L (ref 3.5–5.1)
Sodium: 136 mmol/L (ref 135–145)

## 2015-01-21 LAB — TROPONIN I: TROPONIN I: 0.03 ng/mL (ref <0.031)

## 2015-01-21 LAB — GLUCOSE, CAPILLARY
GLUCOSE-CAPILLARY: 160 mg/dL — AB (ref 65–99)
Glucose-Capillary: 212 mg/dL — ABNORMAL HIGH (ref 65–99)

## 2015-01-21 LAB — POTASSIUM: Potassium: 4.9 mmol/L (ref 3.5–5.1)

## 2015-01-21 LAB — TRIGLYCERIDES: Triglycerides: 105 mg/dL (ref <150)

## 2015-01-21 LAB — MAGNESIUM: MAGNESIUM: 1.6 mg/dL — AB (ref 1.7–2.4)

## 2015-01-21 LAB — LACTIC ACID, PLASMA: LACTIC ACID, VENOUS: 1.7 mmol/L (ref 0.5–2.0)

## 2015-01-21 IMAGING — XA IR DECLOT *L*
1 series · 12 of 24 positions shown · non-contrast
Comparison: none

CLINICAL DATA: 73-year-old female with a history of longstanding
renal failure. She has experienced a left upper extremity
brachycephalic fistula thrombosis. She has been referred for
evaluation and treatment.
TECHNIQUE: The procedure, risks, benefits, and alternatives were explained to
the patient and the patient's family, including bleeding, infection,
arterial thrombus, venous thrombus, vessel injury, need for further
procedure, need for stenting, contrast reaction, need for catheter
placement, cardiopulmonary collapse, death. Questions regarding the
procedure were encouraged and answered. The patient understands and
consents to the procedure.

[Series 1: run · 12 of 84 slices shown]
[im 4/84]
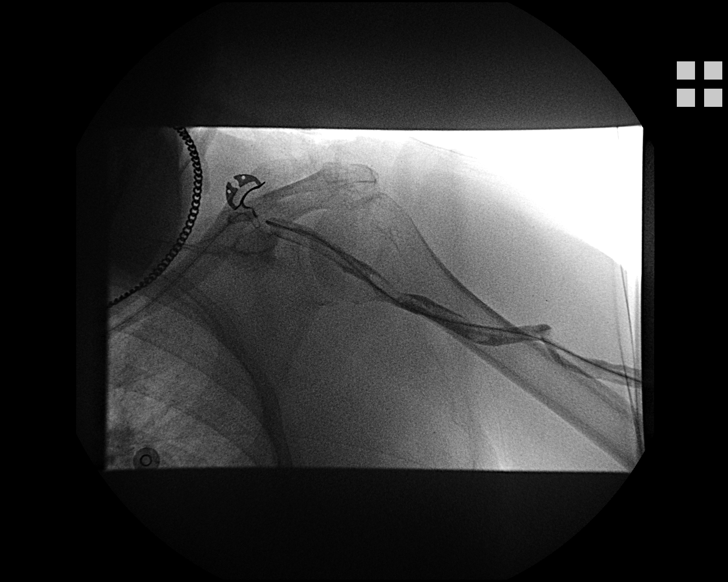
[im 11/84]
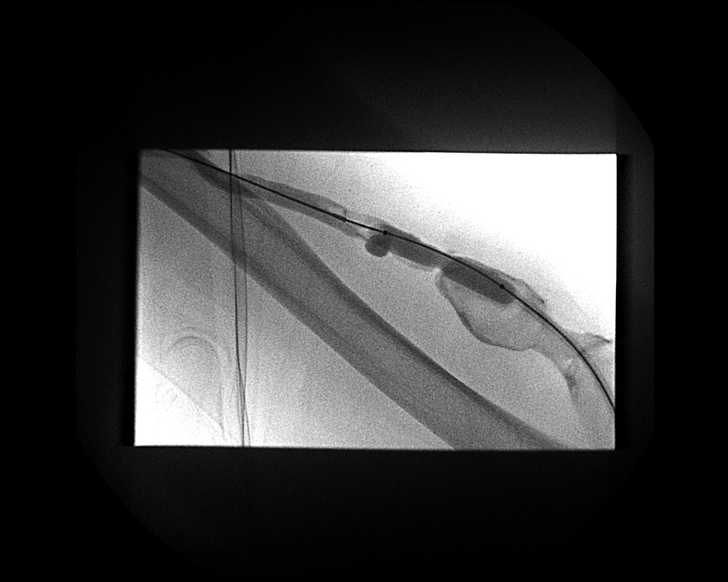
[im 19/84]
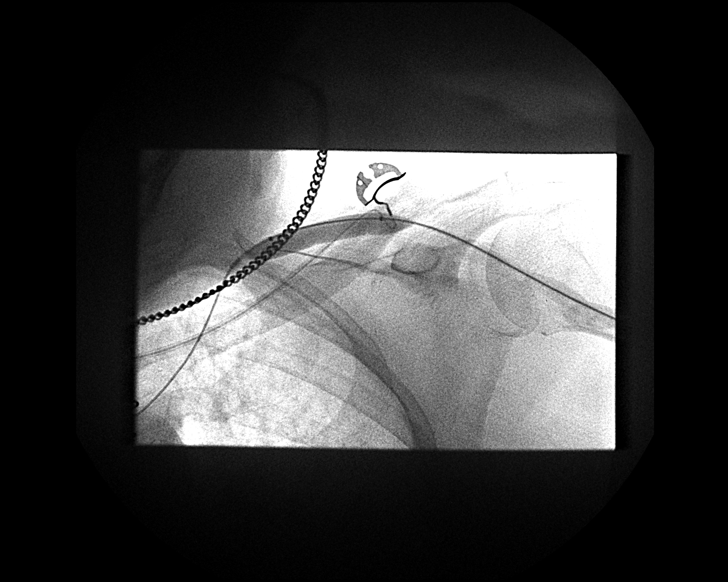
[im 26/84]
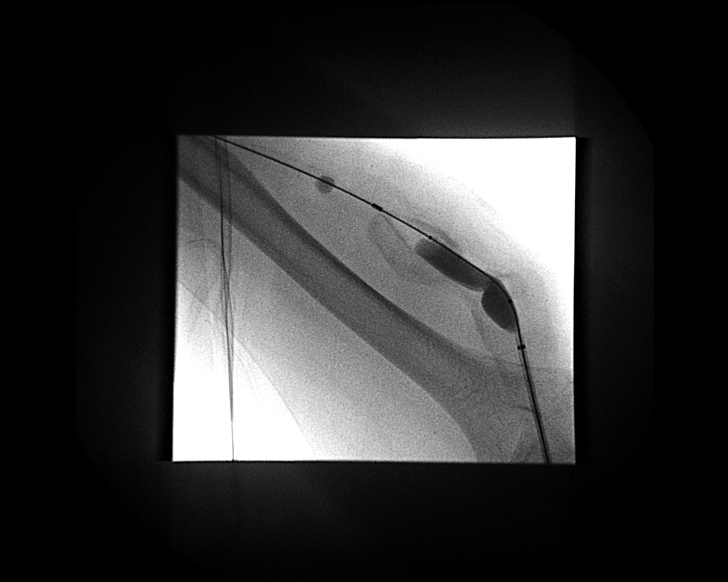
[im 33/84]
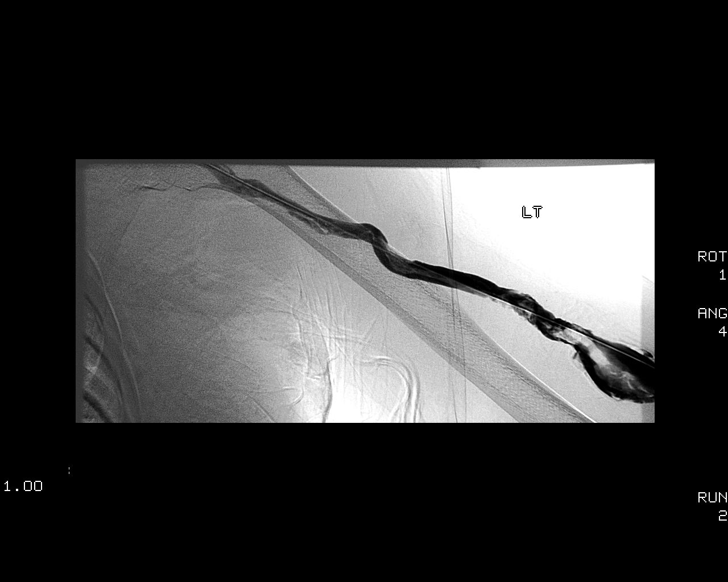
[im 40/84]
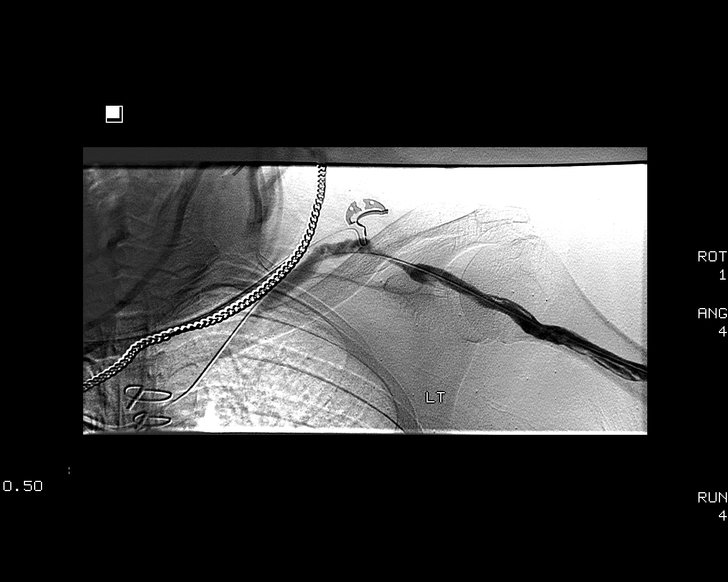
[im 47/84]
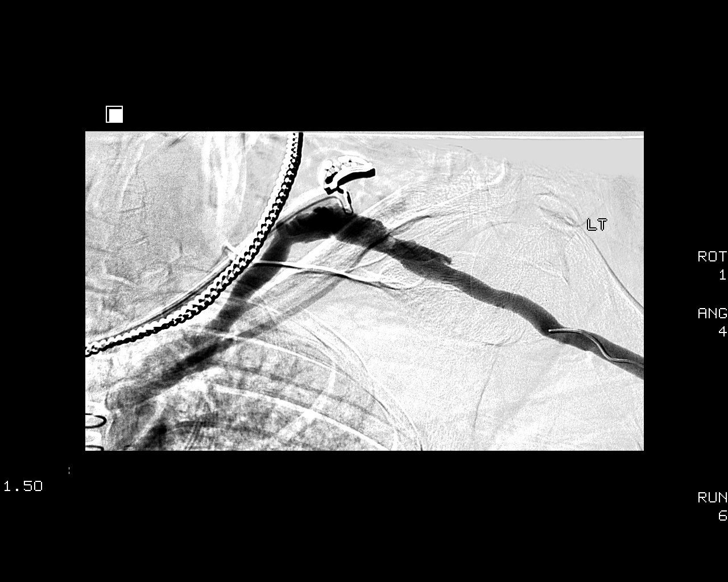
[im 55/84]
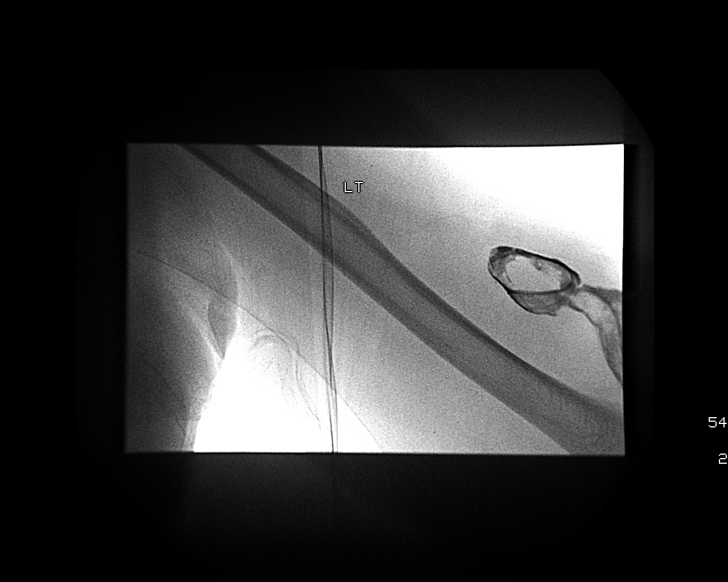
[im 62/84]
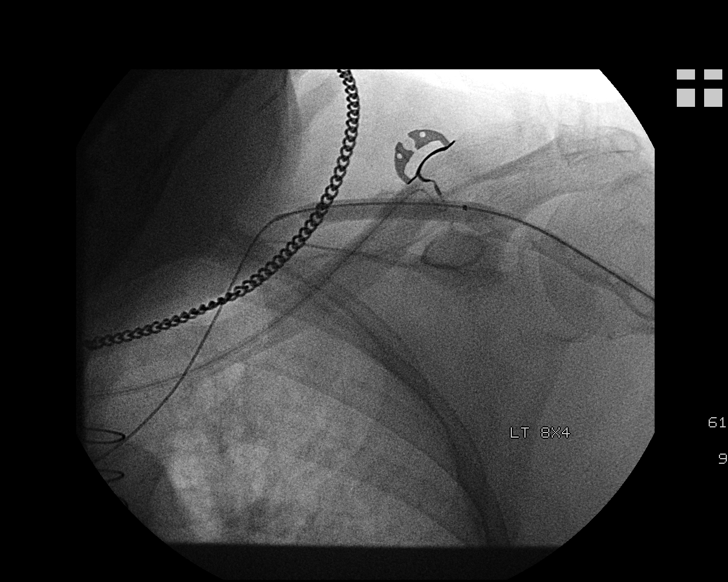
[im 69/84]
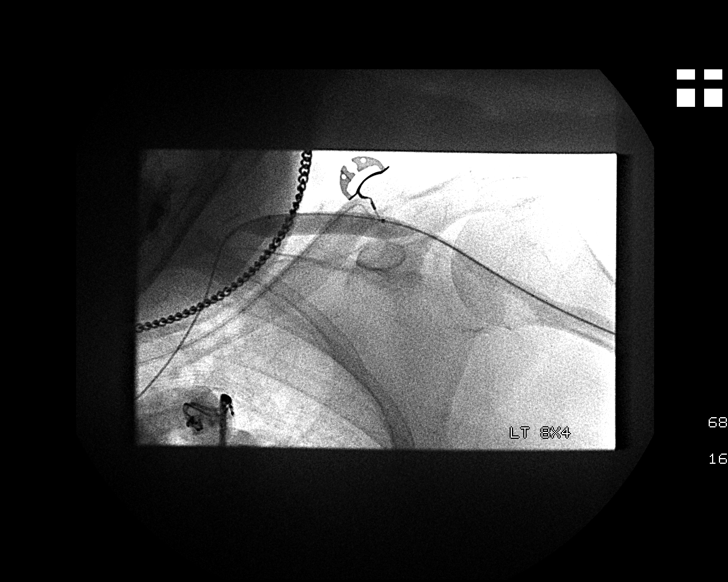
[im 76/84]
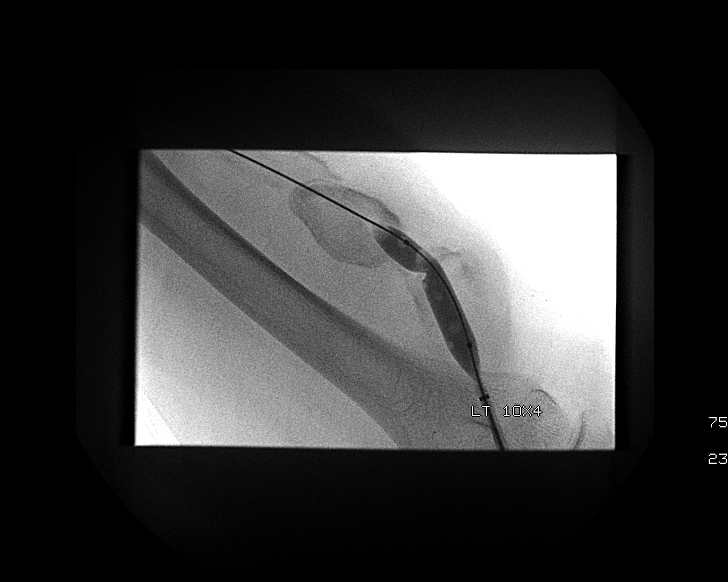
[im 84/84]
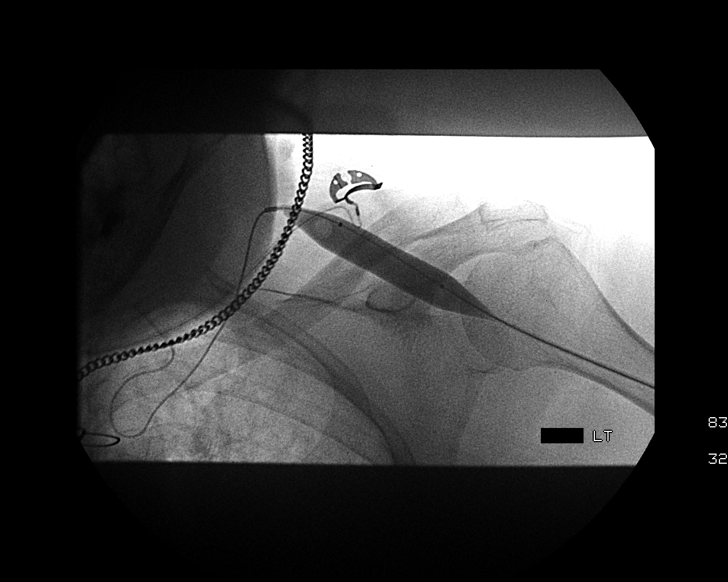

[12 of 24 positions shown; findings below may reference images not displayed]

Brachycephalic fistula was placed 5 years previously. She does not
recall many prior treatments.

EXAM:
DIALYSIS GRAFT DECLOT

VENOUS ANGIOPLASTY

ULTRASOUND GUIDANCE FOR VASCULAR ACCESS

MEDICATIONS:
3.5 mg Versed, 162 mcg fentanyl

40 mg IV labetalol

[D0] units heparin

4 mg tPA

FLUOROSCOPY TIME:  37 minutes
Ultrasound survey was performed with images stored and sent to PACs.

The left upper extremity was prepped and draped in the usual sterile
fashion.

Ultrasound guidance was used to access the upper extremity fistula
with a micropuncture kit. Exchange was made for a 6 French short
sheath.

A combination of a glide catheter, multipurpose angled catheter,
Bentson wire and Glidewire was used to navigate centrally through
several tandem stenoses.

Venogram of the left upper extremity demonstrates patency of the
brachycephalic vein

Upon withdrawal of the angled catheter, a solution of 2 milligrams
of tPA in saline was infused into the outflow portion of the graft.

Multiple balloon angioplasty was performed including at the cephalic
arch, and 3 stenoses of the venous outflow, including region of
occlusion just beyond the aneurysm of the left upper extremity.

Cephalic arch was angioplastied to a diameter of 14 mm.

Repeat fistulogram was then performed.

Catheters and wires were removed with a complete fistulagram
performed through the central vasculature.

No residual stenosis was identified in the venous outflow. No
residual stenosis at the arterial anastomosis.

The short sheaths were removed after placement of hemostasis
sutures.

Patient tolerated the procedure well and remained hemodynamically
stable throughout.

No complications were encountered and no significant blood loss was
encountered.
FINDINGS: Aneurysm of the left upper extremity fistula made it difficult to
navigate centrally. Near complete occlusions at several regions of
the outflow were angioplasty to as great is 14 mm.

At the completion of the study there was a thrill palpated at the
fistula, with no residual stenosis of the outflow. There was mild
residual thrombus within the aneurysmal segment of the outflow.

Arterial anastomosis widely patent.
IMPRESSION: Status post mechanical and pharmacologic thrombectomy/ thrombolysis
of left upper extremity brachycephalic fistula, with balloon
angioplasty of multiple tandem stenosis of the outflow.

ACCESS:
Should this access re- thrombosis, it may be difficult to navigate
through the aneurysm. Consideration of surgical revision may be
considered.

## 2015-01-21 IMAGING — US IR DECLOT *L*
1 series · 1 of 1 positions shown · non-contrast
Comparison: none

CLINICAL DATA: 73-year-old female with a history of longstanding
renal failure. She has experienced a left upper extremity
brachycephalic fistula thrombosis. She has been referred for
evaluation and treatment.
TECHNIQUE: The procedure, risks, benefits, and alternatives were explained to
the patient and the patient's family, including bleeding, infection,
arterial thrombus, venous thrombus, vessel injury, need for further
procedure, need for stenting, contrast reaction, need for catheter
placement, cardiopulmonary collapse, death. Questions regarding the
procedure were encouraged and answered. The patient understands and
consents to the procedure.

[Series 1: ir fluoro/shunt/fist · 1 of 1 slices shown]
[im 1/1]
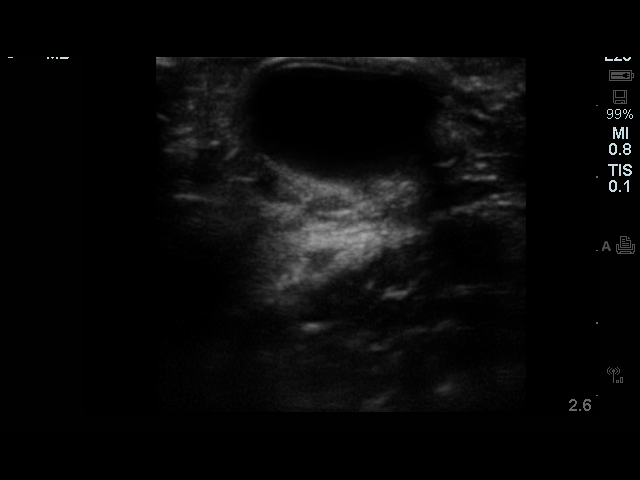

[1 of 1 positions shown; findings below may reference images not displayed]

Brachycephalic fistula was placed 5 years previously. She does not
recall many prior treatments.

EXAM:
DIALYSIS GRAFT DECLOT

VENOUS ANGIOPLASTY

ULTRASOUND GUIDANCE FOR VASCULAR ACCESS

MEDICATIONS:
3.5 mg Versed, 162 mcg fentanyl

40 mg IV labetalol

[D0] units heparin

4 mg tPA

FLUOROSCOPY TIME:  37 minutes
Ultrasound survey was performed with images stored and sent to PACs.

The left upper extremity was prepped and draped in the usual sterile
fashion.

Ultrasound guidance was used to access the upper extremity fistula
with a micropuncture kit. Exchange was made for a 6 French short
sheath.

A combination of a glide catheter, multipurpose angled catheter,
Bentson wire and Glidewire was used to navigate centrally through
several tandem stenoses.

Venogram of the left upper extremity demonstrates patency of the
brachycephalic vein

Upon withdrawal of the angled catheter, a solution of 2 milligrams
of tPA in saline was infused into the outflow portion of the graft.

Multiple balloon angioplasty was performed including at the cephalic
arch, and 3 stenoses of the venous outflow, including region of
occlusion just beyond the aneurysm of the left upper extremity.

Cephalic arch was angioplastied to a diameter of 14 mm.

Repeat fistulogram was then performed.

Catheters and wires were removed with a complete fistulagram
performed through the central vasculature.

No residual stenosis was identified in the venous outflow. No
residual stenosis at the arterial anastomosis.

The short sheaths were removed after placement of hemostasis
sutures.

Patient tolerated the procedure well and remained hemodynamically
stable throughout.

No complications were encountered and no significant blood loss was
encountered.
FINDINGS: Aneurysm of the left upper extremity fistula made it difficult to
navigate centrally. Near complete occlusions at several regions of
the outflow were angioplasty to as great is 14 mm.

At the completion of the study there was a thrill palpated at the
fistula, with no residual stenosis of the outflow. There was mild
residual thrombus within the aneurysmal segment of the outflow.

Arterial anastomosis widely patent.
IMPRESSION: Status post mechanical and pharmacologic thrombectomy/ thrombolysis
of left upper extremity brachycephalic fistula, with balloon
angioplasty of multiple tandem stenosis of the outflow.

ACCESS:
Should this access re- thrombosis, it may be difficult to navigate
through the aneurysm. Consideration of surgical revision may be
considered.

## 2015-01-21 IMAGING — CR DG CHEST 1V PORT
1 series · 1 of 1 positions shown · non-contrast
Comparison: [DATE]

CLINICAL DATA: ET tube placement

EXAM:
PORTABLE CHEST - 1 VIEW

[AP]
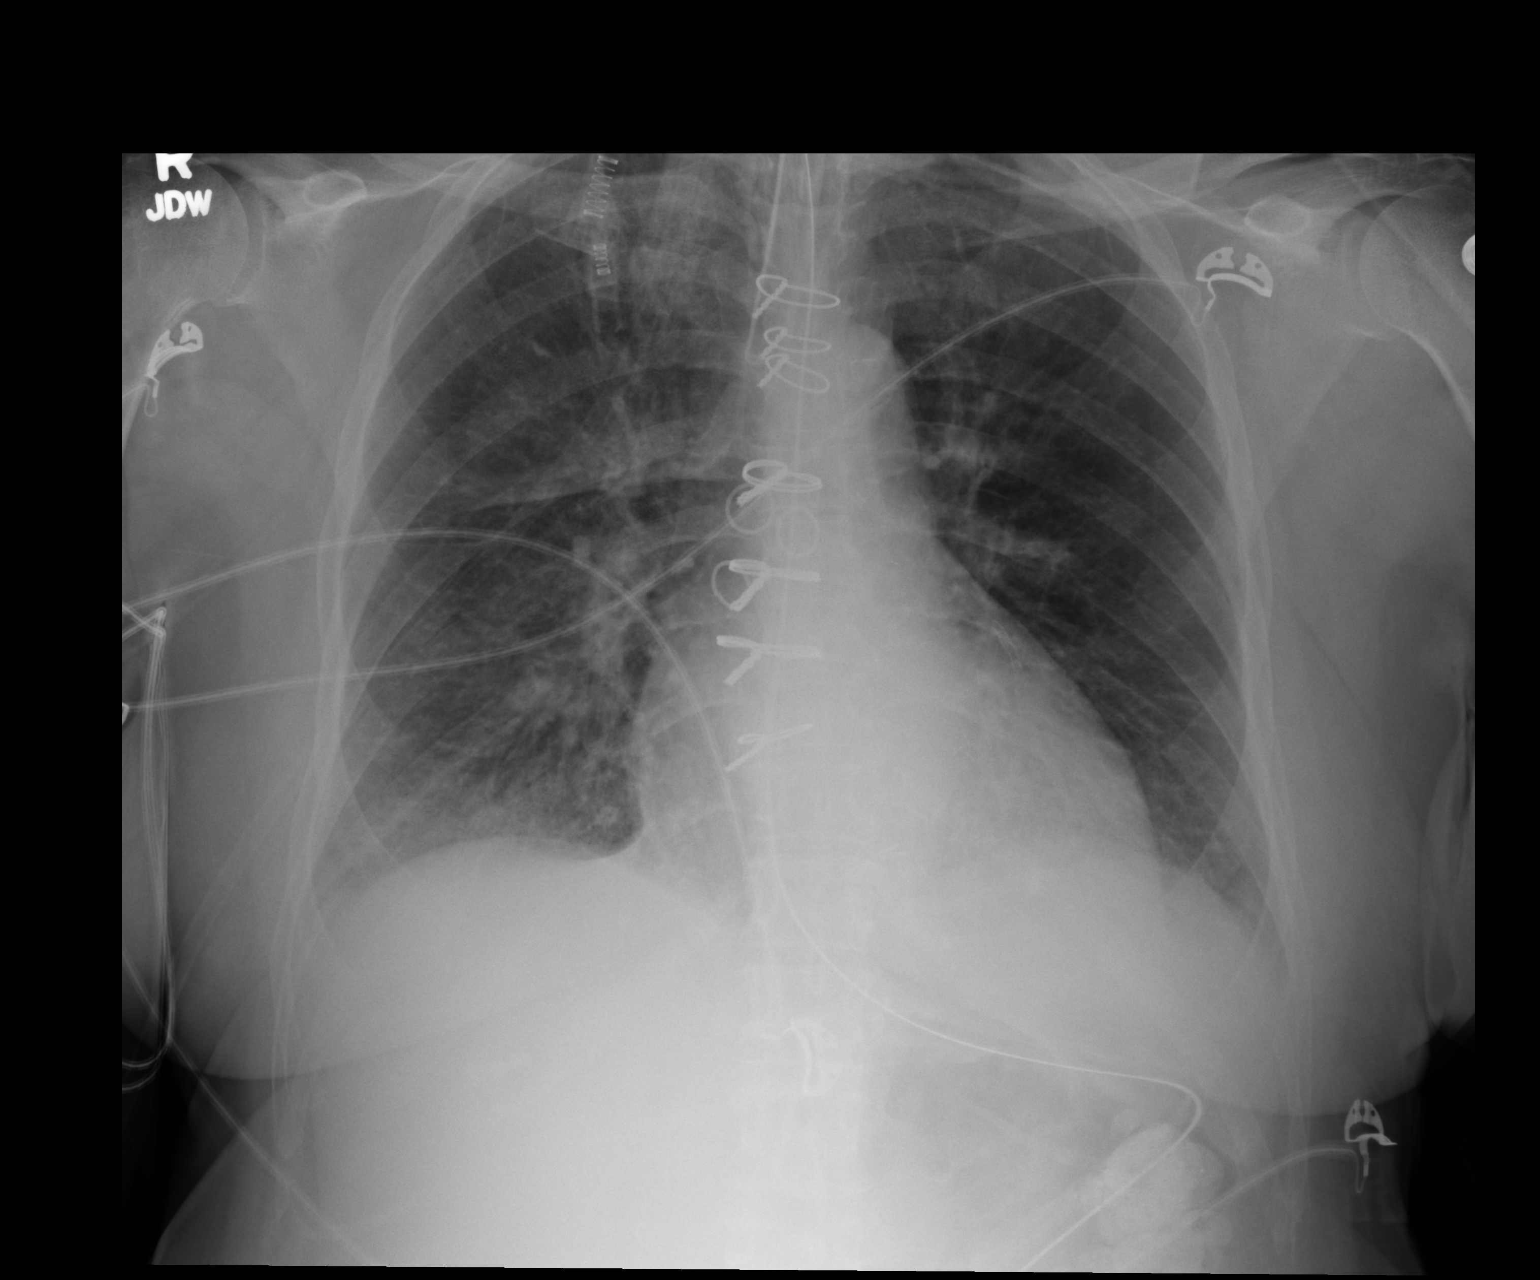

[1 of 1 positions shown; findings below may reference images not displayed]

FINDINGS: Endotracheal tube with the tip 4 cm above the carina. Nasogastric
tube coursing below the diaphragm.

Bilateral mild interstitial thickening. Small bilateral pleural
effusions. No focal consolidation or pneumothorax. Normal heart
size. Prior CABG.
IMPRESSION: Endotracheal tube with the tip 4 cm above the carina.

Nasogastric tube coursing below the diaphragm.

## 2015-01-21 MED ORDER — ALTEPLASE 100 MG IV SOLR
4.0000 mg | Freq: Once | INTRAVENOUS | Status: DC
  Filled 2015-01-21: qty 4

## 2015-01-21 MED ORDER — FENTANYL CITRATE (PF) 100 MCG/2ML IJ SOLN
50.0000 ug | INTRAMUSCULAR | Status: DC | PRN
  Administered 2015-01-22 – 2015-01-23 (×4): 50 ug via INTRAVENOUS
  Filled 2015-01-21 (×3): qty 2

## 2015-01-21 MED ORDER — NITROGLYCERIN IN D5W 200-5 MCG/ML-% IV SOLN: 0.0000 ug/min | INTRAVENOUS | Status: DC

## 2015-01-21 MED ORDER — DIPHENHYDRAMINE HCL 50 MG/ML IJ SOLN
INTRAMUSCULAR | Status: AC
  Filled 2015-01-21: qty 1

## 2015-01-21 MED ORDER — FENTANYL CITRATE (PF) 100 MCG/2ML IJ SOLN
50.0000 ug | INTRAMUSCULAR | Status: AC | PRN
  Administered 2015-01-21: 100 ug via INTRAVENOUS
  Administered 2015-01-23 – 2015-01-24 (×2): 50 ug via INTRAVENOUS
  Filled 2015-01-21 (×4): qty 2

## 2015-01-21 MED ORDER — FENTANYL CITRATE (PF) 100 MCG/2ML IJ SOLN
INTRAMUSCULAR | Status: DC | PRN
  Administered 2015-01-21: 12.5 ug via INTRAVENOUS
  Administered 2015-01-21 (×2): 25 ug via INTRAVENOUS
  Administered 2015-01-21: 12.5 ug via INTRAVENOUS
  Administered 2015-01-21: 25 ug via INTRAVENOUS
  Administered 2015-01-21: 12.5 ug via INTRAVENOUS
  Administered 2015-01-21: 25 ug via INTRAVENOUS

## 2015-01-21 MED ORDER — NALOXONE HCL 1 MG/ML IJ SOLN
INTRAMUSCULAR | Status: AC
  Filled 2015-01-21: qty 2

## 2015-01-21 MED ORDER — NALOXONE HCL 0.4 MG/ML IJ SOLN
INTRAMUSCULAR | Status: DC | PRN
  Administered 2015-01-21: 0.2 mg via INTRAVENOUS
  Administered 2015-01-21: 0.1 mg via INTRAVENOUS
  Administered 2015-01-21: 0.2 mg via INTRAVENOUS

## 2015-01-21 MED ORDER — FLUMAZENIL 0.5 MG/5ML IV SOLN
INTRAVENOUS | Status: AC
  Filled 2015-01-21: qty 5

## 2015-01-21 MED ORDER — SODIUM CHLORIDE 0.9 % IV SOLN
INTRAVENOUS | Status: DC
  Administered 2015-01-21: 22:00:00 via INTRAVENOUS

## 2015-01-21 MED ORDER — LABETALOL HCL 5 MG/ML IV SOLN
10.0000 mg | Freq: Once | INTRAVENOUS | Status: AC
  Administered 2015-01-21: 10 mg via INTRAVENOUS

## 2015-01-21 MED ORDER — MIDAZOLAM HCL 2 MG/2ML IJ SOLN
INTRAMUSCULAR | Status: AC
  Filled 2015-01-21: qty 2

## 2015-01-21 MED ORDER — IOHEXOL 300 MG/ML  SOLN
100.0000 mL | Freq: Once | INTRAMUSCULAR | Status: AC | PRN
  Administered 2015-01-21: 100 mL via INTRAVENOUS

## 2015-01-21 MED ORDER — HEPARIN SODIUM (PORCINE) 1000 UNIT/ML IJ SOLN
INTRAMUSCULAR | Status: DC | PRN
  Administered 2015-01-21: 3000 [IU] via INTRAVENOUS

## 2015-01-21 MED ORDER — FENTANYL CITRATE (PF) 100 MCG/2ML IJ SOLN
INTRAMUSCULAR | Status: AC
  Filled 2015-01-21: qty 2

## 2015-01-21 MED ORDER — PROPOFOL 1000 MG/100ML IV EMUL
0.0000 ug/kg/min | INTRAVENOUS | Status: DC
  Administered 2015-01-21: 20 ug/kg/min via INTRAVENOUS
  Administered 2015-01-22: 35 ug/kg/min via INTRAVENOUS
  Administered 2015-01-22: 20 ug/kg/min via INTRAVENOUS
  Administered 2015-01-22: 15 ug/kg/min via INTRAVENOUS
  Filled 2015-01-21 (×4): qty 100

## 2015-01-21 MED ORDER — SODIUM CHLORIDE 0.9 % IV SOLN
INTRAVENOUS | Status: DC | PRN
  Administered 2015-01-21: 10 mL/h via INTRAVENOUS

## 2015-01-21 MED ORDER — METHYLPREDNISOLONE SODIUM SUCC 125 MG IJ SOLR
INTRAMUSCULAR | Status: AC
  Filled 2015-01-21: qty 2

## 2015-01-21 MED ORDER — LIDOCAINE HCL 1 % IJ SOLN
INTRAMUSCULAR | Status: AC
  Filled 2015-01-21: qty 20

## 2015-01-21 MED ORDER — FLUMAZENIL 0.5 MG/5ML IV SOLN
INTRAVENOUS | Status: DC | PRN
  Administered 2015-01-21: 0.3 mg via INTRAVENOUS

## 2015-01-21 MED ORDER — LABETALOL HCL 5 MG/ML IV SOLN
INTRAVENOUS | Status: AC
  Administered 2015-01-21: 10 mg via INTRAVENOUS
  Filled 2015-01-21: qty 4

## 2015-01-21 MED ORDER — MIDAZOLAM HCL 2 MG/2ML IJ SOLN
INTRAMUSCULAR | Status: DC | PRN
  Administered 2015-01-21 (×4): 0.5 mg via INTRAVENOUS
  Administered 2015-01-21: 1 mg via INTRAVENOUS

## 2015-01-21 MED ORDER — FAMOTIDINE IN NACL 20-0.9 MG/50ML-% IV SOLN
20.0000 mg | Freq: Two times a day (BID) | INTRAVENOUS | Status: DC
  Administered 2015-01-21 – 2015-01-22 (×2): 20 mg via INTRAVENOUS
  Filled 2015-01-21 (×3): qty 50

## 2015-01-21 MED ORDER — ALBUTEROL SULFATE (2.5 MG/3ML) 0.083% IN NEBU
INHALATION_SOLUTION | RESPIRATORY_TRACT | Status: AC
  Filled 2015-01-21: qty 6

## 2015-01-21 MED ORDER — ALTEPLASE 100 MG IV SOLR
INTRAVENOUS | Status: AC | PRN
  Administered 2015-01-21: 1 mg
  Administered 2015-01-21: 2 mg
  Administered 2015-01-21: 1 mg

## 2015-01-21 MED ORDER — SODIUM CHLORIDE 0.9 % IV SOLN: 250.0000 mL | INTRAVENOUS | Status: DC | PRN

## 2015-01-21 MED ORDER — HEPARIN SODIUM (PORCINE) 1000 UNIT/ML IJ SOLN
INTRAMUSCULAR | Status: AC
  Filled 2015-01-21: qty 1

## 2015-01-21 NOTE — Sedation Documentation (Signed)
C/o pain at L shoulder additional meds as ordered

## 2015-01-21 NOTE — Anesthesia Procedure Notes (Signed)
Procedure Name: Intubation Date/Time: 01/21/2015 7:43 PM Performed by: Shirlyn Goltz Pre-anesthesia Checklist: Patient identified, Emergency Drugs available, Suction available and Patient being monitored Patient Re-evaluated:Patient Re-evaluated prior to inductionOxygen Delivery Method: Circle system utilized Preoxygenation: Pre-oxygenation with 100% oxygen Intubation Type: IV induction Ventilation: Mask ventilation without difficulty Laryngoscope Size: McGraph and 4 Grade View: Grade I Tube type: Oral Tube size: 7.5 mm Number of attempts: 1 Airway Equipment and Method: Stylet Placement Confirmation: ETT inserted through vocal cords under direct vision,  positive ETCO2 and breath sounds checked- equal and bilateral Tube secured with: Tape Dental Injury: Teeth and Oropharynx as per pre-operative assessment

## 2015-01-21 NOTE — Progress Notes (Signed)
Summary: Post procedure pt initially responsive and following commands, then c/o SOB trying to sit up on procedure table,oxygen sat dec added NRB mask with inc in sat. Transferred to stretcher and RN station,(spouse at bs) pt unable to arouse or follow commands, Rapid response team called attempted reversal with narcan IV with slight improvement after 2 doses,pt unable to protect airway or maintain O2 sat, pt intubated per anesthesia, 12L EKG done, transferred to 2M14 via stretcher on monitor in guarded condition.

## 2015-01-21 NOTE — Progress Notes (Signed)
Pt transported to MICU from post IR procedure room. PT intubated prior to arrival on unit.

## 2015-01-21 NOTE — H&P (Signed)
PULMONARY / CRITICAL CARE MEDICINE   Name: Shawna [name] MRN: [id number] DOB: 02-24-1941    ADMISSION DATE:  01/21/2015 CONSULTATION DATE:  01/21/2015  REFERRING MD :  Earleen Newport IR  CHIEF COMPLAINT:    INITIAL PRESENTATION: 74 year old female, ESRD on HD MWF. To IR 7/13 for angioplasty of fistula. She was profoundly hypertensive throughout the procedure, and eventually became unresponsive, requiring intubation and admission to ICU.   STUDIES:  None  SIGNIFICANT EVENTS: 7/13: To IR for angioplasty of fistula and found to be hypertensive. Became unresponsive and required intubation. Admitted to ICU.   HISTORY OF PRESENT ILLNESS:   74 year old female, ESRD on HD MWF with last HD on Monday 7/11. Had brachiocephalic fistula which was placed approximately 5 years ago. Initially presented to nephrology for fistulagram and was unsuccessful. Sent to IR to evaluate for fistulagram with angioplasty and possible placement of tunneled dialysis catheter. She was profoundly hypertensive throughout the procedure, and eventually became unresponsive, requiring intubation and admission to ICU.   PAST MEDICAL HISTORY :   has a past medical history of Chronic bronchitis; GERD (gastroesophageal reflux disease); PUD (peptic ulcer disease); History of lower GI bleeding; Arthritis; History of gout; CAD (coronary artery disease); High cholesterol (12/2011); Pneumonia (~ 2009); Iron deficiency anemia; TIA (transient ischemic attack); Anxiety; History of blood transfusion (07/2011; 12/2011; 01/2012 X 2; 04/2012); Carotid artery disease; Mitral regurgitation; Myocardial infarction; Chronic diastolic CHF (congestive heart failure); Hypertension; AVM (arteriovenous malformation) of colon; Esophageal stricture; Ovarian cancer (1992); Colon cancer (1992); and ESRD on hemodialysis.  has past surgical history that includes Abdominal hysterectomy (1992); Appendectomy (06/1990); Tubal ligation (1980's); AV fistula placement (07/2009);  Thrombectomy / arteriovenous graft revision (2011); Colon resection (1992); Esophagogastroduodenoscopy (01/20/2012); Dilation and curettage of uterus; Coronary angioplasty with stent (12/15/11); Coronary angioplasty with stent (y/2013); Coronary artery bypass graft (06/13/2012); Intraoprative transesophageal echocardiogram (06/13/2012); Esophagogastroduodenoscopy (N/A, 03/26/2013); Ovary surgery; Cardiac surgery; left heart catheterization with coronary angiogram (N/A, 12/15/2011); left heart catheterization with coronary angiogram (N/A, 01/10/2012); left heart catheterization with coronary angiogram (N/A, 06/08/2012); shuntogram (N/A, 10/15/2013); and left heart catheterization with coronary/graft angiogram (N/A, 12/10/2013). Prior to Admission medications   Medication Sig Start Date End Date Taking? Authorizing Provider  acetaminophen (TYLENOL) 325 MG tablet Take 2 tablets (650 mg total) by mouth every 6 (six) hours as needed for mild pain (mild pain). 10/02/14  Yes Erline Hau, MD  ALPRAZolam Duanne Moron) 0.5 MG tablet Take 0.5 mg by mouth 3 (three) times daily.   Yes Historical Provider, MD  aspirin EC 81 MG tablet Take 81 mg by mouth daily.   Yes Historical Provider, MD  benzonatate (TESSALON PERLES) 100 MG capsule Take 1 capsule (100 mg total) by mouth 3 (three) times daily as needed for cough. 10/02/14  Yes Erline Hau, MD  cloNIDine (CATAPRES) 0.3 MG tablet Take 1 tablet (0.3 mg total) by mouth 2 (two) times daily. 12/19/14  Yes Herminio Commons, MD  dextromethorphan-guaiFENesin [facility] DM) 30-600 MG per 12 hr tablet Take 1 tablet by mouth daily. Patient taking differently: Take 1 tablet by mouth 3 (three) times daily as needed.  09/29/14  Yes Fredia Sorrow, MD  fluticasone (FLONASE) 50 MCG/ACT nasal spray Place 2 sprays into the nose daily as needed for allergies.    Yes Historical Provider, MD  folic acid-vitamin b complex-vitamin c-selenium-zinc (DIALYVITE) 3 MG TABS Take 1 tablet by  mouth daily.   Yes Historical Provider, MD  hydrALAZINE (APRESOLINE) 50 MG tablet Take  1.5 tablets (75 mg total) by mouth 3 (three) times daily. 01/19/15  Yes Herminio Commons, MD  isosorbide dinitrate (ISORDIL) 30 MG tablet Take 1 tablet (30 mg total) by mouth 3 (three) times daily. 10/02/14  Yes Erline Hau, MD  labetalol (NORMODYNE) 200 MG tablet Take 200 mg by mouth 2 (two) times daily.   Yes Historical Provider, MD  lanthanum (FOSRENOL) 1000 MG chewable tablet Chew 1,000 mg by mouth 3 (three) times daily after meals.   Yes Historical Provider, MD  lidocaine-prilocaine (EMLA) cream Apply 1 application topically daily as needed (fo access).  11/29/14  Yes Historical Provider, MD  loratadine (CLARITIN) 10 MG tablet Take 10 mg by mouth daily.    Yes Historical Provider, MD  nitroGLYCERIN (NITROSTAT) 0.4 MG SL tablet Place 1 tablet (0.4 mg total) under the tongue every 5 (five) minutes x 3 doses as needed for chest pain. 06/23/14  Yes Herminio Commons, MD  omeprazole (PRILOSEC) 20 MG capsule Take 1 capsule (20 mg total) by mouth daily. 02/10/14  Yes Ladene Artist, MD  polyethylene glycol [facility] / Floria Raveling) packet Take 17 g by mouth daily. Patient taking differently: Take 17 g by mouth daily as needed for mild constipation or moderate constipation.  10/02/14  Yes Estela Leonie Green, MD  SENSIPAR 30 MG tablet Take 30 mg by mouth daily with supper.  12/21/12  Yes Historical Provider, MD  simvastatin (ZOCOR) 20 MG tablet Take 1 tablet (20 mg total) by mouth at bedtime. 11/04/14  Yes Herminio Commons, MD  tetrahydrozoline 0.05 % ophthalmic solution Place 1 drop into both eyes 2 (two) times daily as needed (irritation).   Yes Historical Provider, MD   Allergies  Allergen Reactions  . Aspirin Other (See Comments)    Mess up her stomach; "makes my bowels have blood in them". Takes 81 mg EC Aspirin   . Contrast Media [Iodinated Diagnostic Agents] Itching  . Iron Itching and  Other (See Comments)    "they gave me iron in dialysis; had to give me Benadryl cause I had to have the iron" (05/02/2012)  . Macrodantin [Nitrofurantoin Macrocrystal] Other (See Comments)    "broke me out in big old knots all over my body; had to go to ER"  . Penicillins Other (See Comments)    "makes me real weak when I take it; like I'll pass out"  . Plavix [Clopidogrel Bisulfate] Rash  . Amlodipine Swelling    Leg swelling  . Bactrim [Sulfamethoxazole-Trimethoprim] Rash  . Sulfa Antibiotics Rash  . Venofer [Ferric Oxide] Itching    Patient reports using Benadryl prior to doses as [facility]  . Dexilant [Dexlansoprazole] Other (See Comments)    Upset stomach  . Morphine And Related     Itching in feet  . Prilosec [Omeprazole] Other (See Comments)    "back spasms"  . Levaquin [Levofloxacin In D5w] Rash  . Protonix [Pantoprazole Sodium] Rash   FAMILY HISTORY:  indicated that her mother is deceased. She indicated that her father is deceased. She indicated that both of her sisters are alive. She indicated that only one of her two brothers is alive.  SOCIAL HISTORY:  reports that she has never smoked. She has never used smokeless tobacco. She reports that she does not drink alcohol or use illicit drugs.  REVIEW OF SYSTEMS:  Unable to obtain due to ventilator dependant status, currently sedated  VITAL SIGNS: Pulse Rate:  [67-109] 89 (07/13 2000) Resp:  [12-32]  22 (07/13 2000) BP: (0-253)/(0-158) 0/0 mmHg (07/13 1956) SpO2:  [88 %-100 %] 100 % (07/13 2000) FiO2 (%):  [40 %] 40 % (07/13 2000) HEMODYNAMICS:   VENTILATOR SETTINGS: Vent Mode:  [-] PRVC FiO2 (%):  [40 %] 40 % Set Rate:  [15 bmp] 15 bmp Vt Set:  [400 mL] 400 mL PEEP:  [5 cmH20] 5 cmH20 INTAKE / OUTPUT: No intake or output data in the 24 hours ending 01/21/15 2037  PHYSICAL EXAMINATION: General: 74yo female, mildly agitated Neuro: Sedated on propofol, no focal deficits HEENT: MMM, PERRLA Cardiovascular: S1  and S2 noted, no murmurs Lungs:  Coarse but equal breath sounds bilaterally, ventilator in place Abdomen:  Soft and nondistended, bowel sounds noted Musculoskeletal: No edema noted, pulses palpable in upper and lower extremities bilaterally Skin: No rashes noted  LABS:  CBC  Recent Labs Lab 01/21/15 1613  HGB 9.9*  HCT 29.0*   Coag's No results for input(s): APTT, INR in the last 168 hours. BMET  Recent Labs Lab 01/21/15 1612 01/21/15 1613  NA  --  136  K 4.9 4.9  GLUCOSE  --  102*   Electrolytes No results for input(s): CALCIUM, MG, PHOS in the last 168 hours. Sepsis Markers No results for input(s): LATICACIDVEN, PROCALCITON, O2SATVEN in the last 168 hours. ABG No results for input(s): PHART, PCO2ART, PO2ART in the last 168 hours. Liver Enzymes No results for input(s): AST, ALT, ALKPHOS, BILITOT, ALBUMIN in the last 168 hours. Cardiac Enzymes No results for input(s): TROPONINI, PROBNP in the last 168 hours. Glucose  Recent Labs Lab 01/21/15 1936  GLUCAP 212*   Imaging No results found.  ASSESSMENT / PLAN:  PULMONARY OETT 7/13>> A: Acute Respiratory Failure, Ventilator Dependent P:   Full vent support VAP bundle SBT in AM if able CXR in AM  CARDIOVASCULAR CVL>> A:  H/O CAD with NSTEMI in 2013 and stent placement and CABG H/O Chronic Diastolic CHF with Echo in 2015 showing EF 55-60%, moderate LVH, grade 1 H/O Malignant Hypertension P:  Nitroglycerin gtt PRN. Resume clonidine, hydralazine, labetalol Cardiac Monitor. Monitor BP and HR  RENAL A:   Malfunctioning AV Fistula H/O ESRD on HD MWF P:   Obtain BMP NS @ 50.  GASTROINTESTINAL A:   H/O GI Bleed, Colon cancer P:   NPO Pepcid  HEMATOLOGIC A:   No acute inssues P:  Obtain CBC SCD  INFECTIOUS A:   No evidence of infection P:   Monitor clinically  ENDOCRINE A:   No acute issues P:   Monitor glucose on BMPs  NEUROLOGIC A:   Acute encephalopathy H/O TIA P:    Sedation: Propofol gtt / fentanyl PRN RASS goal: -1  FAMILY  - Updates: Husband at bedside.  - Inter-disciplinary family meet or Palliative Care meeting due by:  01/28/15  CC time:  35 minutes.  Dr. Junie Panning, [place], PGY-2 [phone] 01/21/2015, 8:37 PM   Montey Hora, [place] [facility] Pager: [phone]  or [phone] 01/22/2015, 1:30 AM

## 2015-01-21 NOTE — Code Documentation (Signed)
CODE BLUE NOTE  Patient Name: Shawna [name]   MRN: [id number]   Date of Birth/ Sex: [birth year] , female      Admission Date: 01/21/2015  Attending Provider: Collene Gobble, MD  Primary Diagnosis: <principal problem not specified>    Indication: 88 yr F with known CAD, s/p CABG, ESRD on HD. Presented today for outpatient IR de-clot procedure of AVF, last HD on Monday 7/11. Recent K pre-op 4.9. Per IR / anesthesia patient received Versed and Fentanyl sedation, found to be severely HTN SBP >230, received Labetalol. At end of procedure patient became unresponsive, Code Blue was called, did not lose pulse, received Narcan x 2 and Flumazenil with significant improvement in response.  Technical Description:  - CPR performance duration:  0  minute  - Was defibrillation or cardioversion used? No   - Was external pacer placed? No  - Was patient intubated pre/post CPR? Yes    Medications Administered: Y = Yes; Blank = No Amiodarone    Atropine    Calcium    Epinephrine    Lidocaine    Magnesium    Norepinephrine    Phenylephrine    Sodium bicarbonate    Vasopressin      Post CPR evaluation:  - Final Status - Was patient successfully resuscitated ? Yes - What is current rhythm? Sinus - What is current hemodynamic status? HTN with SBP >200   Miscellaneous Information:  - Labs sent, including: BMET, Mag, CBC, Lactic Acid, CBG, I-stat  - Primary team notified?  Outpatient IR procedure. No primary team. ICU called for admission.  - Family Notified? Yes - husband  - Additional notes/ transfer status: Transferred to ICU. PA Georgann Housekeeper present for admission to ICU.    Code Team Responders  Dellia Nims, MD - Internal Medicine PGY-2 Wendee Beavers, MD - Family Medicine PGY-1 Olin Hauser, DO - Family Med PGY-3  01/21/2015, 8:11 PM

## 2015-01-21 NOTE — Procedures (Signed)
Interventional Radiology Procedure Note  Procedure: left BC fistula declot, with 4mg  tPA, 3000U heparin, and angioplasty of multiple outflow stenosis.   Findings:  Aneurysm of outflow.  Occlusion of outflow on start.  Multiple outflow stenosis including cephalic arch.  Balloon arch to 61mm.  Balloon outflow to 30mm.  Patent arterial anastamosis.  Complications: None Recommendations:  - Ok to use for dialysis.  - Remove suture at next dialysis.    Signed,  Dulcy Fanny. Earleen Newport, DO

## 2015-01-21 NOTE — Consult Note (Signed)
Reason for Consult: Continuity of ESRD care Referring Physician: Baltazar Apo M.D. (CCM)  HPI:  74 year old African-American woman with past medical history significant for end-stage renal disease on hemodialysis on a Monday/Wednesday/Friday schedule at the [facility] unit in [place], [place] who I saw earlier in the day for an attempted thrombectomy however failed and sent for a second opinion/attempt at thrombectomy by [facility]. They were successfully able to perform the thrombectomy however it appears she had severe elevation of blood pressures and then respiratory arrest following sedation/beta blocker requiring resuscitation/intubation.  We have been asked to see her to help with continuity of ESRD care/provision of dialysis. Because of some bleeding from her from recent thrombectomy-she has a loose dressing placed over it. She had a left femoral dialysis catheter placed earlier for the possibility that her fistula could not be used.  Past Medical History  Diagnosis Date  . Chronic bronchitis   . GERD (gastroesophageal reflux disease)   . PUD (peptic ulcer disease)   . History of lower GI bleeding   . Arthritis   . History of gout   . CAD (coronary artery disease)     a. 12/2011 NSTEMI/Cath/PCI LCX (2.25x14 Resolute DES) & D1 (2.25x22 Resolute DES);  b. 01/2012 Cath/PCI: LM 30, LAD 30p, 40-77m, D1 stent ok, 99 in sm branch of diag, LCX patent stent, OM1 20, RCA 95 ost (4.0x12 Promus DES), EF 55%;  c. 04/2012 Lexi Cardiolite  EF 48%, small area of scar @ base/mid inflat wall with mild peri-infarct ischemia.; CABG 12/4  . High cholesterol 12/2011  . Pneumonia ~ 2009  . Iron deficiency anemia   . TIA (transient ischemic attack)   . Anxiety   . History of blood transfusion 07/2011; 12/2011; 01/2012 X 2; 04/2012  . Carotid artery disease     a. [id number] LICA, [id number]   . Mitral regurgitation     a. Moderate by echo, 02/2012  . Myocardial infarction   . Chronic diastolic CHF (congestive heart failure)     a.  02/2012 Echo EF 60-65%, nl wall motion, Gr 1 DD, mod MR  . Hypertension   . AVM (arteriovenous malformation) of colon   . Esophageal stricture   . Ovarian cancer 1992  . Colon cancer 1992  . ESRD on hemodialysis     ESRD due to HTN, started dialysis 2011 and gets HD at [facility] with Dr Hinda Lenis on MWF schedule.  Access is LUA AVF as of Sept 2014.     Past Surgical History  Procedure Laterality Date  . Abdominal hysterectomy  1992  . Appendectomy  06/1990  . Tubal ligation  1980's  . Av fistula placement  07/2009    left upper arm  . Thrombectomy / arteriovenous graft revision  2011    left upper arm  . Colon resection  1992  . Esophagogastroduodenoscopy  01/20/2012    Procedure: ESOPHAGOGASTRODUODENOSCOPY (EGD);  Surgeon: Ladene Artist, MD,FACG;  Location: [facility] ENDOSCOPY;  Service: Endoscopy;  Laterality: N/A;  . Dilation and curettage of uterus    . Coronary angioplasty with stent placement  12/15/11    "2"  . Coronary angioplasty with stent placement  y/2013    "1; makes total of 3" (05/02/2012)  . Coronary artery bypass graft  06/13/2012    Procedure: CORONARY ARTERY BYPASS GRAFTING (CABG);  Surgeon: Grace Isaac, MD;  Location: [place];  Service: Open Heart Surgery;  Laterality: N/A;  cabg x four;  using left internal mammary artery, and left  leg greater saphenous vein harvested endoscopically  . Intraoperative transesophageal echocardiogram  06/13/2012    Procedure: INTRAOPERATIVE TRANSESOPHAGEAL ECHOCARDIOGRAM;  Surgeon: Grace Isaac, MD;  Location: [place];  Service: Open Heart Surgery;  Laterality: N/A;  . Esophagogastroduodenoscopy N/A 03/26/2013    Procedure: ESOPHAGOGASTRODUODENOSCOPY (EGD);  Surgeon: Irene Shipper, MD;  [facility]COPY;  Service: Endoscopy;  Laterality: N/A;  . Ovary surgery      ovarian cancer  . Cardiac surgery    . Left heart catheterization with coronary angiogram N/A 12/15/2011    Procedure: LEFT HEART CATHETERIZATION WITH CORONARY  ANGIOGRAM;  Surgeon: Burnell Blanks, MD;  Location[facility] CATH LAB;  Service: Cardiovascular;  Laterality: N/A;  . Left heart catheterization with coronary angiogram N/A 01/10/2012    Procedure: LEFT HEART CATHETERIZATION WITH CORONARY ANGIOGRAM;  Surgeon: Peter M Martinique, MD;  Location: [facility] CATH LAB;  Service: Cardiovascular;  Laterality: N/A;  . Left heart catheterization with coronary angiogram N/A 06/08/2012    Procedure: LEFT HEART CATHETERIZATION WITH CORONARY ANGIOGRAM;  Surgeon: Burnell Blanks, MD;  Location: [facility] CATH LAB;  Service: Cardiovascular;  Laterality: N/A;  . Shuntogram N/A 10/15/2013    Procedure: Fistulogram;  Surgeon: Serafina Mitchell, MD;  Location[facility] CATH LAB;  Service: Cardiovascular;  Laterality: N/A;  . Left heart catheterization with coronary/graft angiogram N/A 12/10/2013    Procedure: LEFT HEART CATHETERIZATION WITH Beatrix Fetters;  Surgeon: [name], MD;  Location: [facility] CATH LAB;  Service: Cardiovascular;  Laterality: N/A;    Family History  Problem Relation Age of Onset  . Other      noncontributory for early CAD  . Heart disease Mother     Heart Disease before age 59  . Hyperlipidemia Mother   . Hypertension Mother   . Diabetes Mother   . Heart attack Mother   . Heart disease Father     Heart Disease before age 43  . Hyperlipidemia Father   . Hypertension Father   . Diabetes Father   . Diabetes Sister   . Hypertension Sister   . Diabetes Brother   . Hyperlipidemia Brother   . Heart attack Brother   . Colon cancer Neg Hx   . Esophageal cancer Neg Hx   . Liver disease Neg Hx   . Kidney disease Neg Hx   . Colon polyps Neg Hx   . Hypertension Sister   . Heart attack Brother     Social History:  reports that she has never smoked. She has never used smokeless tobacco. She reports that she does not drink alcohol or use illicit drugs.  Allergies:  Allergies  Allergen Reactions  . Aspirin Other (See Comments)    Mess up her  stomach; "makes my bowels have blood in them". Takes 81 mg EC Aspirin   . Contrast Media [Iodinated Diagnostic Agents] Itching  . Iron Itching and Other (See Comments)    "they gave me iron in dialysis; had to give me Benadryl cause I had to have the iron" (05/02/2012)  . Macrodantin [Nitrofurantoin Macrocrystal] Other (See Comments)    "broke me out in big old knots all over my body; had to go to ER"  . Penicillins Other (See Comments)    "makes me real weak when I take it; like I'll pass out"  . Plavix [Clopidogrel Bisulfate] Rash  . Amlodipine Swelling    Leg swelling  . Bactrim [Sulfamethoxazole-Trimethoprim] Rash  . Sulfa Antibiotics Rash  . Venofer [Ferric Oxide] Itching  Patient reports using Benadryl prior to doses as [facility]  . Dexilant [Dexlansoprazole] Other (See Comments)    Upset stomach  . Morphine And Related     Itching in feet  . Prilosec [Omeprazole] Other (See Comments)    "back spasms"  . Levaquin [Levofloxacin In D5w] Rash  . Protonix [Pantoprazole Sodium] Rash    Medications:  Scheduled: . albuterol      . alteplase  4 mg Intracatheter Once  . diphenhydrAMINE      . famotidine (PEPCID) IV  20 mg Intravenous Q12H  . fentaNYL      . fentaNYL      . flumazenil      . flumazenil      . heparin      . lidocaine      . methylPREDNISolone sodium succinate      . midazolam      . midazolam      . naloxone      . naloxone        Results for orders placed or performed during the hospital encounter of 01/21/15 (from the past 48 hour(s))  Potassium     Status: None   Collection Time: 01/21/15  4:12 PM  Result Value Ref Range   Potassium 4.9 3.5 - 5.1 mmol/L  I-STAT 4, (NA,K, GLUC, HGB,HCT)     Status: Abnormal   Collection Time: 01/21/15  4:13 PM  Result Value Ref Range   Sodium 136 135 - 145 mmol/L   Potassium 4.9 3.5 - 5.1 mmol/L   Glucose, Bld 102 (H) 65 - 99 mg/dL   HCT 29.0 (L) 36.0 - 46.0 %   Hemoglobin 9.9 (L) 12.0 - 15.0 g/dL   Glucose, capillary     Status: Abnormal   Collection Time: 01/21/15  7:36 PM  Result Value Ref Range   Glucose-Capillary 212 (H) 65 - 99 mg/dL  Glucose, capillary     Status: Abnormal   Collection Time: 01/21/15  8:36 PM  Result Value Ref Range   Glucose-Capillary 160 (H) 65 - 99 mg/dL    Dg Chest Port 1 View  01/21/2015   CLINICAL DATA:  ET tube placement  EXAM: PORTABLE CHEST - 1 VIEW  COMPARISON:  12/22/2014  FINDINGS: Endotracheal tube with the tip 4 cm above the carina. Nasogastric tube coursing below the diaphragm.  Bilateral mild interstitial thickening. Small bilateral pleural effusions. No focal consolidation or pneumothorax. Normal heart size. Prior CABG.  IMPRESSION: Endotracheal tube with the tip 4 cm above the carina.  Nasogastric tube coursing below the diaphragm.   Electronically Signed   By: Kathreen Devoid   On: 01/21/2015 21:23    Review of Systems  Unable to perform ROS: intubated   Blood pressure 243/106, pulse 89, temperature 97.3 F (36.3 C), temperature source Axillary, resp. rate 22, height 5\' 2"  (1.575 m), SpO2 100 %. Physical Exam  Nursing note and vitals reviewed. Constitutional: She appears well-developed and well-nourished.  HENT:  Head: Normocephalic and atraumatic.  Intubated/sedated  Eyes: Pupils are equal, round, and reactive to light.  Neck: No JVD present.  Cardiovascular: Normal rate, regular rhythm and normal heart sounds.   Respiratory: Effort normal and breath sounds normal.  GI: Soft. Bowel sounds are normal. She exhibits no distension. There is no tenderness.  Musculoskeletal: She exhibits no edema.  Left brachiocephalic fistula covered with loose gauze dressing-clean/dry. Palpable thrill in the upper arm.  Left femoral temporary dialysis catheter placed  Skin: Skin is warm  and dry. No rash noted. No erythema.    Assessment/Plan: 1. Respiratory arrest: Appears to be secondary to sedation/preceding hypertension-intubated and ventilator  management per critical care. 2. End-stage renal disease: No acute dialysis needs are noted at this time-she appears to be close to euvolemic versus mildly hypervolemic on physical exam and chest x-ray does not show florid pulmonary edema. Will order for hemodialysis tomorrow 3. Status post thrombectomy of left brachiocephalic AV fistula 4. Anemia of chronic kidney disease: Check hemoglobin with labs and assess need for ESA 5. Chronic kidney disease-metabolic bone disease: Reconcile medications for phosphorus binders/vitamin D receptor analogues 6. Hypertensive urgency: When the patient was seen, she status post sedation following intubation with decreased blood pressures-106/68  Hermine Feria K. 01/21/2015, 10:36 PM

## 2015-01-21 NOTE — Transfer of Care (Signed)
Immediate Anesthesia Transfer of Care Note  Patient: Shawna [name]  Procedure(s) Performed: * No procedures listed *  Patient Location: PACU and Radiology  Anesthesia Type:General  Level of Consciousness: Patient remains intubated per anesthesia plan  Airway & Oxygen Therapy: Patient remains intubated per anesthesia plan and Patient placed on Ventilator (see vital sign flow sheet for setting)  Post-op Assessment: Report given to RN  Post vital signs: Reviewed  Last Vitals:  Filed Vitals:   01/21/15 2037  BP:   Pulse:   Temp: 36.3 C  Resp:     Complications: No apparent anesthesia complications

## 2015-01-21 NOTE — H&P (Signed)
Chief Complaint: Malfunctioning AV Fistula  Referring Physician(s): Patel,Balubhai N  History of Present Illness: Shawna [name] is a 74 y.o. female with ESRD on hemodialysis who was seen today at Dr. Serita Grit office for fistulagram.   He was unsuccessful at angioplasty and we are asked to evaluate her for fistulagram with angioplasty and possible placement of tunneled dialysis catheter if we are unsuccessful.  She has a brachiocephalic fistula which has been present x 5 years.  She stats she had "it ballooned at her shoulder" in the past.  Last successful dialysis was Monday and she states "it bled for a long time after that"  Past Medical History  Diagnosis Date  . Chronic bronchitis   . GERD (gastroesophageal reflux disease)   . PUD (peptic ulcer disease)   . History of lower GI bleeding   . Arthritis   . History of gout   . CAD (coronary artery disease)     a. 12/2011 NSTEMI/Cath/PCI LCX (2.25x14 Resolute DES) & D1 (2.25x22 Resolute DES);  b. 01/2012 Cath/PCI: LM 30, LAD 30p, 40-3m, D1 stent ok, 99 in sm branch of diag, LCX patent stent, OM1 20, RCA 95 ost (4.0x12 Promus DES), EF 55%;  c. 04/2012 Lexi Cardiolite  EF 48%, small area of scar @ base/mid inflat wall with mild peri-infarct ischemia.; CABG 12/4  . High cholesterol 12/2011  . Pneumonia ~ 2009  . Iron deficiency anemia   . TIA (transient ischemic attack)   . Anxiety   . History of blood transfusion 07/2011; 12/2011; 01/2012 X 2; 04/2012  . Carotid artery disease     a. [id number] LICA, [id number]   . Mitral regurgitation     a. Moderate by echo, 02/2012  . Myocardial infarction   . Chronic diastolic CHF (congestive heart failure)     a. 02/2012 Echo EF 60-65%, nl wall motion, Gr 1 DD, mod MR  . Hypertension   . AVM (arteriovenous malformation) of colon   . Esophageal stricture   . Ovarian cancer 1992  . Colon cancer 1992  . ESRD on hemodialysis     ESRD due to HTN, started dialysis 2011 and gets HD at [facility] with Dr  Hinda Lenis on MWF schedule.  Access is LUA AVF as of Sept 2014.     Past Surgical History  Procedure Laterality Date  . Abdominal hysterectomy  1992  . Appendectomy  06/1990  . Tubal ligation  1980's  . Av fistula placement  07/2009    left upper arm  . Thrombectomy / arteriovenous graft revision  2011    left upper arm  . Colon resection  1992  . Esophagogastroduodenoscopy  01/20/2012    Procedure: ESOPHAGOGASTRODUODENOSCOPY (EGD);  Surgeon: Ladene Artist, MD,FACG;  Location: [facility] ENDOSCOPY;  Service: Endoscopy;  Laterality: N/A;  . Dilation and curettage of uterus    . Coronary angioplasty with stent placement  12/15/11    "2"  . Coronary angioplasty with stent placement  y/2013    "1; makes total of 3" (05/02/2012)  . Coronary artery bypass graft  06/13/2012    Procedure: CORONARY ARTERY BYPASS GRAFTING (CABG);  Surgeon: Grace Isaac, MD;  Location: [place];  Service: Open Heart Surgery;  Laterality: N/A;  cabg x four;  using left internal mammary artery, and left leg greater saphenous vein harvested endoscopically  . Intraoperative transesophageal echocardiogram  06/13/2012    Procedure: INTRAOPERATIVE TRANSESOPHAGEAL ECHOCARDIOGRAM;  Surgeon: Grace Isaac, MD;  Location: [place];  Service:  Open Heart Surgery;  Laterality: N/A;  . Esophagogastroduodenoscopy N/A 03/26/2013    Procedure: ESOPHAGOGASTRODUODENOSCOPY (EGD);  Surgeon: Irene Shipper, MD;  Location: [facility] ENDOSCOPY;  Service: Endoscopy;  Laterality: N/A;  . Ovary surgery      ovarian cancer  . Cardiac surgery    . Left heart catheterization with coronary angiogram N/A 12/15/2011    Procedure: LEFT HEART CATHETERIZATION WITH CORONARY ANGIOGRAM;  Surgeon: Burnell Blanks, MD;  Location: [facility] CATH LAB;  Service: Cardiovascular;  Laterality: N/A;  . Left heart catheterization with coronary angiogram N/A 01/10/2012    Procedure: LEFT HEART CATHETERIZATION WITH CORONARY ANGIOGRAM;  Surgeon: Peter M Martinique, MD;  Location: [facility] CATH LAB;   Service: Cardiovascular;  Laterality: N/A;  . Left heart catheterization with coronary angiogram N/A 06/08/2012    Procedure: LEFT HEART CATHETERIZATION WITH CORONARY ANGIOGRAM;  Surgeon: Burnell Blanks, MD;  [facility] CATH LAB;  Service: Cardiovascular;  Laterality: N/A;  . Shuntogram N/A 10/15/2013    Procedure: Fistulogram;  Surgeon: Serafina Mitchell, MD;  Location: [facility] CATH LAB;  Service: Cardiovascular;  Laterality: N/A;  . Left heart catheterization with coronary/graft angiogram N/A 12/10/2013    Procedure: LEFT HEART CATHETERIZATION WITH Beatrix Fetters;  Surgeon: [name], MD;  Location: [facility] CATH LAB;  Service: Cardiovascular;  Laterality: N/A;    Allergies: Aspirin; Contrast media; Iron; Macrodantin; Penicillins; Plavix; Amlodipine; Bactrim; Sulfa antibiotics; Venofer; Dexilant; Morphine and related; Prilosec; Levaquin; and Protonix  Medications: Prior to Admission medications   Medication Sig Start Date End Date Taking? Authorizing Provider  acetaminophen (TYLENOL) 325 MG tablet Take 2 tablets (650 mg total) by mouth every 6 (six) hours as needed for mild pain (mild pain). 10/02/14  Yes Erline Hau, MD  ALPRAZolam Duanne Moron) 0.5 MG tablet Take 0.5 mg by mouth 3 (three) times daily.   Yes Historical Provider, MD  aspirin EC 81 MG tablet Take 81 mg by mouth daily.   Yes Historical Provider, MD  benzonatate (TESSALON PERLES) 100 MG capsule Take 1 capsule (100 mg total) by mouth 3 (three) times daily as needed for cough. 10/02/14  Yes Erline Hau, MD  cloNIDine (CATAPRES) 0.3 MG tablet Take 1 tablet (0.3 mg total) by mouth 2 (two) times daily. 12/19/14  Yes Herminio Commons, MD  dextromethorphan-guaiFENesin [facility] DM) 30-600 MG per 12 hr tablet Take 1 tablet by mouth daily. Patient taking differently: Take 1 tablet by mouth 3 (three) times daily as needed.  09/29/14  Yes Fredia Sorrow, MD  fluticasone (FLONASE) 50 MCG/ACT nasal spray  Place 2 sprays into the nose daily as needed for allergies.    Yes Historical Provider, MD  folic acid-vitamin b complex-vitamin c-selenium-zinc (DIALYVITE) 3 MG TABS Take 1 tablet by mouth daily.   Yes Historical Provider, MD  hydrALAZINE (APRESOLINE) 50 MG tablet Take 1.5 tablets (75 mg total) by mouth 3 (three) times daily. 01/19/15  Yes Herminio Commons, MD  isosorbide dinitrate (ISORDIL) 30 MG tablet Take 1 tablet (30 mg total) by mouth 3 (three) times daily. 10/02/14  Yes Erline Hau, MD  labetalol (NORMODYNE) 200 MG tablet Take 200 mg by mouth 2 (two) times daily.   Yes Historical Provider, MD  lanthanum (FOSRENOL) 1000 MG chewable tablet Chew 1,000 mg by mouth 3 (three) times daily after meals.   Yes Historical Provider, MD  lidocaine-prilocaine (EMLA) cream Apply 1 application topically daily as needed (fo access).  11/29/14  Yes Historical Provider, MD  loratadine (CLARITIN)  10 MG tablet Take 10 mg by mouth daily.    Yes Historical Provider, MD  nitroGLYCERIN (NITROSTAT) 0.4 MG SL tablet Place 1 tablet (0.4 mg total) under the tongue every 5 (five) minutes x 3 doses as needed for chest pain. 06/23/14  Yes Herminio Commons, MD  omeprazole (PRILOSEC) 20 MG capsule Take 1 capsule (20 mg total) by mouth daily. 02/10/14  Yes Ladene Artist, MD  polyethylene glycol [facility] / Floria Raveling) packet Take 17 g by mouth daily. Patient taking differently: Take 17 g by mouth daily as needed for mild constipation or moderate constipation.  10/02/14  Yes Estela Leonie Green, MD  SENSIPAR 30 MG tablet Take 30 mg by mouth daily with supper.  12/21/12  Yes Historical Provider, MD  simvastatin (ZOCOR) 20 MG tablet Take 1 tablet (20 mg total) by mouth at bedtime. 11/04/14  Yes Herminio Commons, MD  tetrahydrozoline 0.05 % ophthalmic solution Place 1 drop into both eyes 2 (two) times daily as needed (irritation).   Yes Historical Provider, MD     Family History  Problem Relation Age of Onset   . Other      noncontributory for early CAD  . Heart disease Mother     Heart Disease before age 63  . Hyperlipidemia Mother   . Hypertension Mother   . Diabetes Mother   . Heart attack Mother   . Heart disease Father     Heart Disease before age 71  . Hyperlipidemia Father   . Hypertension Father   . Diabetes Father   . Diabetes Sister   . Hypertension Sister   . Diabetes Brother   . Hyperlipidemia Brother   . Heart attack Brother   . Colon cancer Neg Hx   . Esophageal cancer Neg Hx   . Liver disease Neg Hx   . Kidney disease Neg Hx   . Colon polyps Neg Hx   . Hypertension Sister   . Heart attack Brother     History   Social History  . Marital Status: Married    Spouse Name: N/A  . Number of Children: N/A  . Years of Education: N/A   Social History Main Topics  . Smoking status: Never Smoker   . Smokeless tobacco: Never Used  . Alcohol Use: No  . Drug Use: No  . Sexual Activity: Yes    Birth Control/ Protection: Surgical   Other Topics Concern  . None   Social History Narrative   Lives in [place], [place] with husband.  Dialysis pt - [facility].     Review of Systems: A 12 point ROS discussed and pertinent positives are indicated in the HPI above.  All other systems are negative.  Review of Systems  Constitutional: Negative for fever and activity change.  Respiratory: Negative for cough and chest tightness.   Cardiovascular: Negative for chest pain.  Gastrointestinal: Negative for abdominal pain.  Genitourinary: Negative.   Musculoskeletal: Negative for back pain.  Skin: Negative.   Neurological: Negative.   Psychiatric/Behavioral: Negative.     Vital Signs: BP 205/82 mmHg  Pulse 67  Resp 12  SpO2 97%  Physical Exam  Constitutional: She is oriented to person, place, and time. She appears well-developed and well-nourished.  HENT:  Head: Normocephalic.  Eyes: EOM are normal.  Neck: Normal range of motion. Neck supple.  Cardiovascular: Normal rate,  regular rhythm and normal heart sounds.   No murmur heard. Pulmonary/Chest: Effort normal and breath sounds normal.  Abdominal: Soft.  Bowel sounds are normal.  Musculoskeletal: Normal range of motion.  Neurological: She is alert and oriented to person, place, and time.  Skin: Skin is warm and dry.  Psychiatric: She has a normal mood and affect. Her behavior is normal. Judgment and thought content normal.  Vitals reviewed. Left Arm Brachocephalic fistula with + pulse, diminished thrill.  Mallampati Score:  MD Evaluation Airway: WNL Heart: WNL Abdomen: WNL Chest/ Lungs: WNL ASA  Classification: 3 Mallampati/Airway Score: Two  Imaging: US Abdomen Complete  01/06/2015   CLINICAL DATA:  Epigastric abdominal pain, hypertension, chronic kidney disease  EXAM: ULTRASOUND ABDOMEN COMPLETE  COMPARISON:  CT abdomen pelvis of 04/24/2014  FINDINGS: Gallbladder: The gallbladder is visualized and no gallstones are noted. There is no pain over the gallbladder with compression.  Common bile duct: Diameter: The common bile duct is normal measuring 3.0 mm in diameter.  Liver: The liver slightly echogenic and mild fatty infiltration cannot be excluded. No focal hepatic abnormality is seen.  IVC: No abnormality visualized.  Pancreas: Visualized portion unremarkable.  Spleen: The spleen is normal measuring 7.3 cm.  Right Kidney: Length: 7.6 cm. The echogenicity of the renal parenchyma is increased in the kidneys small consistent with chronic renal medical disease. Several complex cystic structures are present the largest of 1.8 cm.  Left Kidney: Length: 6.9 cm. The echogenicity of the renal parenchyma is increased, and the kidney is small consistent with chronic renal medical disease. A small cyst is present of 1.2 cm.  Abdominal aorta: The abdominal aorta is normal in caliber with calcified walls.  Other findings: None.  IMPRESSION: 1. Echogenic liver parenchyma may indicate fatty infiltration. No focal hepatic  abnormality is seen. 2. Echogenic renal parenchyma and the kidneys are small, consistent with chronic renal medical disease. Small cysts are present but no hydronephrosis is seen. 3. No gallstones.   Electronically Signed   By: Ivar Drape M.D.   On: 01/06/2015 11:05    Labs:  CBC:  Recent Labs  11/10/14 0350 12/08/14 1639 12/13/14 0900 12/22/14 0342  WBC 10.6* 5.4 7.3 7.9  HGB 10.5* 10.1* 10.5* 9.3*  HCT 33.2* 31.8* 33.4* 30.2*  PLT 282 218 251 300    COAGS: No results for input(s): INR, APTT in the last 8760 hours.  BMP:  Recent Labs  11/10/14 0350 12/08/14 1639 12/13/14 0900 12/22/14 0342  NA 138 136 139 143  K 4.9 3.3* 4.0 4.9  CL 99* 96* 99* 103  CO2 24 28 28 23   GLUCOSE 105* 163* 110* 131*  BUN 65* 23* 21* 66*  CALCIUM 8.3* 9.2 9.5 9.8  CREATININE 11.82* 5.99* 6.79* 12.09*  GFRNONAA 3* 6* 5* 3*  GFRAA 3* 7* 6* 3*    LIVER FUNCTION TESTS:  Recent Labs  10/01/14 0300 10/02/14 0613 10/16/14 2232 12/13/14 0900  BILITOT 0.9 0.8 0.5 1.0  AST 33 30 14 19   ALT 27 25 14  13*  ALKPHOS 76 77 107 83  PROT 6.3 6.1 6.8 6.0*  ALBUMIN 3.5 3.3* 3.8 3.6    TUMOR MARKERS: No results for input(s): AFPTM, CEA, CA199, CHROMGRNA in the last 8760 hours.  Assessment and Plan:  ESRD with malfunctioning AV Fistula.  Will proceed with fistulagram with possible angioplasty today by Dr. Earleen Newport.  If unsuccessful, will place a Permacath  Risks and Benefits discussed with the patient including, but not limited to bleeding, infection, vascular injury, pulmonary embolism, need for tunneled HD catheter placement or even death. All of the patient's questions were answered,  patient is agreeable to proceed. Consent signed and in chart.   Thank you for this interesting consult.  I greatly enjoyed meeting Shawna [name] and look forward to participating in their care.  SignedMurrell Redden 01/21/2015, 2:59 PM   I spent a total of  30 Minutes  in face to face in clinical  consultation, greater than 50% of which was counseling/coordinating care for malfunctioning AV fistula

## 2015-01-21 NOTE — Procedures (Signed)
Hemodialysis Catheter Insertion Procedure Note TANYELL BRISCOE [id number] 01-Jun-1941  Procedure: Insertion of Hemodialysis Catheter Indications: Dialysis, Malfunctioning Fistula  Procedure Details Consent: Risks of procedure as well as the alternatives and risks of each were explained to the husband.  Consent for procedure obtained. Time Out: Verified patient identification, verified procedure, site/side was marked, verified correct patient position, special equipment/implants available, medications/allergies/relevent history reviewed, required imaging and test results available.  Performed  Maximum sterile technique was used including antiseptics, cap, gloves, gown, hand hygiene, mask and sheet. Skin prep: Chlorhexidine; local anesthetic administered A antimicrobial bonded/coated triple lumen catheter was placed in the left femoral vein due to patient being a dialysis patient using the Seldinger technique.  Evaluation Blood flow good Complications: No apparent complications Patient did tolerate procedure well. Chest X-ray ordered to verify placement.  CXR: pending.  Procedure performed under direct ultrasound guidance for real time vessel cannulation.      Dr. Junie Panning, DO Family Medicine, PGY-2 01/21/2015, 10:06 PM    Montey Hora, [facility] Pager: [phone]  or [phone] 01/21/2015, 10:35 PM

## 2015-01-22 ENCOUNTER — Encounter (INDEPENDENT_AMBULATORY_CARE_PROVIDER_SITE_OTHER): Payer: Self-pay

## 2015-01-22 ENCOUNTER — Inpatient Hospital Stay (HOSPITAL_COMMUNITY): Payer: Medicare Other

## 2015-01-22 ENCOUNTER — Telehealth (INDEPENDENT_AMBULATORY_CARE_PROVIDER_SITE_OTHER): Payer: Self-pay | Admitting: Internal Medicine

## 2015-01-22 DIAGNOSIS — R0609 Other forms of dyspnea: Secondary | ICD-10-CM | POA: Insufficient documentation

## 2015-01-22 DIAGNOSIS — R0602 Shortness of breath: Secondary | ICD-10-CM | POA: Insufficient documentation

## 2015-01-22 DIAGNOSIS — G9340 Encephalopathy, unspecified: Secondary | ICD-10-CM | POA: Insufficient documentation

## 2015-01-22 DIAGNOSIS — I10 Essential (primary) hypertension: Secondary | ICD-10-CM | POA: Insufficient documentation

## 2015-01-22 DIAGNOSIS — R0600 Dyspnea, unspecified: Secondary | ICD-10-CM | POA: Insufficient documentation

## 2015-01-22 LAB — BASIC METABOLIC PANEL
Anion gap: 13 (ref 5–15)
BUN: 53 mg/dL — ABNORMAL HIGH (ref 6–20)
CALCIUM: 8.9 mg/dL (ref 8.9–10.3)
CO2: 22 mmol/L (ref 22–32)
CREATININE: 11.69 mg/dL — AB (ref 0.44–1.00)
Chloride: 100 mmol/L — ABNORMAL LOW (ref 101–111)
GFR calc Af Amer: 3 mL/min — ABNORMAL LOW (ref >60)
GFR, EST NON AFRICAN AMERICAN: 3 mL/min — AB (ref >60)
GLUCOSE: 104 mg/dL — AB (ref 65–99)
Potassium: 6.3 mmol/L (ref 3.5–5.1)
SODIUM: 135 mmol/L (ref 135–145)

## 2015-01-22 LAB — CBC
HCT: 24.8 % — ABNORMAL LOW (ref 36.0–46.0)
Hemoglobin: 7.9 g/dL — ABNORMAL LOW (ref 12.0–15.0)
MCH: 30.3 pg (ref 26.0–34.0)
MCHC: 31.9 g/dL (ref 30.0–36.0)
MCV: 95 fL (ref 78.0–100.0)
Platelets: 198 10*3/uL (ref 150–400)
RBC: 2.61 MIL/uL — AB (ref 3.87–5.11)
RDW: 16.3 % — AB (ref 11.5–15.5)
WBC: 9 10*3/uL (ref 4.0–10.5)

## 2015-01-22 LAB — RENAL FUNCTION PANEL
Albumin: 3.1 g/dL — ABNORMAL LOW (ref 3.5–5.0)
Anion gap: 10 (ref 5–15)
BUN: 18 mg/dL (ref 6–20)
CALCIUM: 9 mg/dL (ref 8.9–10.3)
CHLORIDE: 104 mmol/L (ref 101–111)
CO2: 26 mmol/L (ref 22–32)
CREATININE: 5.72 mg/dL — AB (ref 0.44–1.00)
GFR calc Af Amer: 8 mL/min — ABNORMAL LOW (ref >60)
GFR calc non Af Amer: 7 mL/min — ABNORMAL LOW (ref >60)
Glucose, Bld: 98 mg/dL (ref 65–99)
POTASSIUM: 4 mmol/L (ref 3.5–5.1)
Phosphorus: 3.7 mg/dL (ref 2.5–4.6)
Sodium: 140 mmol/L (ref 135–145)

## 2015-01-22 LAB — GLUCOSE, CAPILLARY
GLUCOSE-CAPILLARY: 95 mg/dL (ref 65–99)
GLUCOSE-CAPILLARY: 72 mg/dL (ref 65–99)
GLUCOSE-CAPILLARY: 96 mg/dL (ref 65–99)
Glucose-Capillary: 60 mg/dL — ABNORMAL LOW (ref 65–99)
Glucose-Capillary: 88 mg/dL (ref 65–99)

## 2015-01-22 LAB — MAGNESIUM
Magnesium: 1.6 mg/dL — ABNORMAL LOW (ref 1.7–2.4)
Magnesium: 1.7 mg/dL (ref 1.7–2.4)

## 2015-01-22 LAB — MRSA PCR SCREENING: MRSA by PCR: NEGATIVE

## 2015-01-22 LAB — HEPATITIS B SURFACE ANTIBODY, QUANTITATIVE: Hepatitis B-Post: 30.2 m[IU]/mL (ref >9.9)

## 2015-01-22 IMAGING — CR DG CHEST 1V PORT
1 series · 1 of 1 positions shown · non-contrast
Comparison: [DATE].

CLINICAL DATA: Respiratory failure.

EXAM:
PORTABLE CHEST - 1 VIEW

[AP]
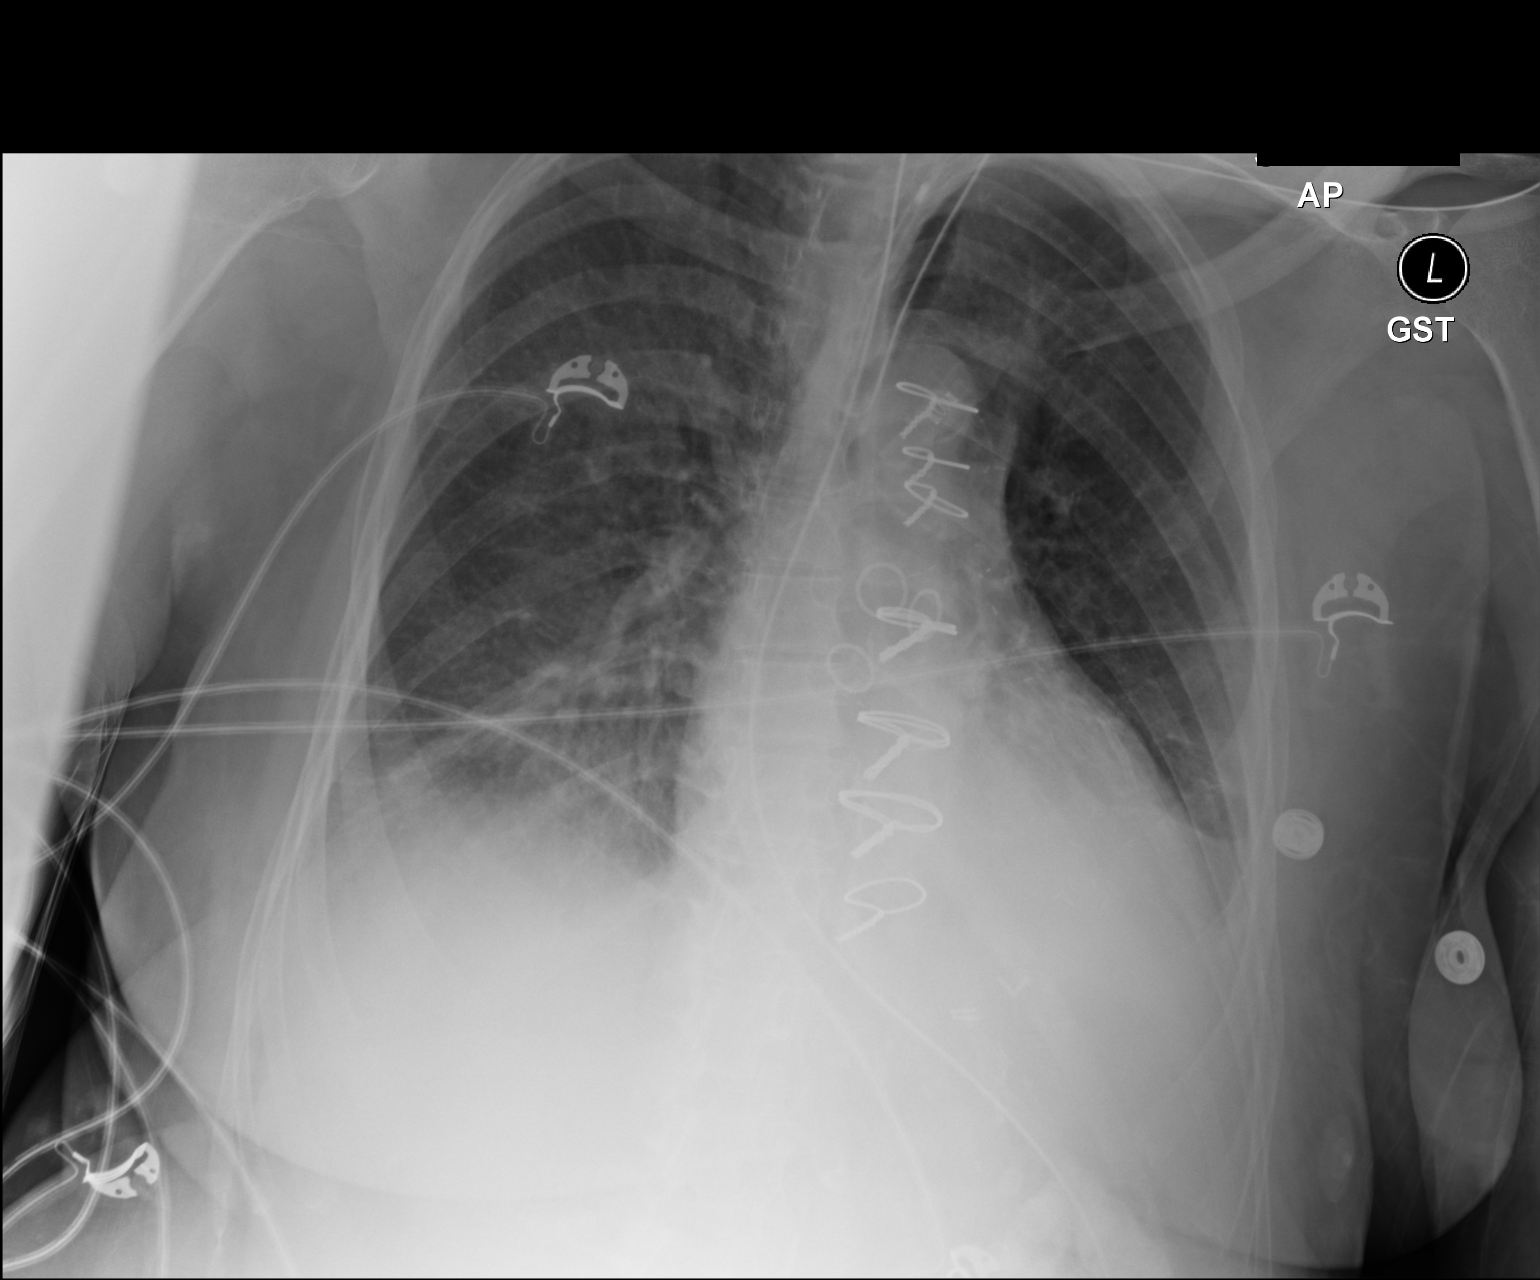

[1 of 1 positions shown; findings below may reference images not displayed]

FINDINGS: Endotracheal tube and NG tube in stable position. Prior CABG. Heart
size normal. Pulmonary vascularity normal. Bibasilar atelectasis
and/or infiltrates. Small pleural effusions. No pneumothorax.
IMPRESSION: 1. Lines and tubes in stable position.
2. Bibasilar atelectasis and/or infiltrates with small bilateral
pleural effusions.
3. Prior CABG.  Heart size normal.  No pulmonary venous congestion.

## 2015-01-22 MED ORDER — LABETALOL HCL 200 MG PO TABS
200.0000 mg | ORAL_TABLET | Freq: Two times a day (BID) | ORAL | Status: DC
  Administered 2015-01-22 – 2015-01-24 (×5): 200 mg
  Filled 2015-01-22 (×8): qty 1

## 2015-01-22 MED ORDER — CLONIDINE HCL 0.2 MG PO TABS
0.2000 mg | ORAL_TABLET | Freq: Two times a day (BID) | ORAL | Status: DC
  Administered 2015-01-22 – 2015-01-23 (×4): 0.2 mg
  Filled 2015-01-22 (×6): qty 1

## 2015-01-22 MED ORDER — SODIUM CHLORIDE 0.9 % IV SOLN: 100.0000 mL | INTRAVENOUS | Status: DC | PRN

## 2015-01-22 MED ORDER — HYDRALAZINE HCL 50 MG PO TABS
75.0000 mg | ORAL_TABLET | Freq: Three times a day (TID) | ORAL | Status: DC
Start: 2015-01-22 — End: 2015-01-25
  Administered 2015-01-22 – 2015-01-24 (×8): 75 mg
  Filled 2015-01-22 (×14): qty 1

## 2015-01-22 MED ORDER — CETYLPYRIDINIUM CHLORIDE 0.05 % MT LIQD
7.0000 mL | Freq: Four times a day (QID) | OROMUCOSAL | Status: DC
  Administered 2015-01-22 – 2015-01-27 (×12): 7 mL via OROMUCOSAL

## 2015-01-22 MED ORDER — CHLORHEXIDINE GLUCONATE 0.12 % MT SOLN
15.0000 mL | Freq: Two times a day (BID) | OROMUCOSAL | Status: DC
  Administered 2015-01-22 – 2015-01-26 (×10): 15 mL via OROMUCOSAL
  Filled 2015-01-22 (×10): qty 15

## 2015-01-22 MED ORDER — CLONIDINE HCL 0.2 MG PO TABS
0.2000 mg | ORAL_TABLET | Freq: Two times a day (BID) | ORAL | Status: DC
  Administered 2015-01-22: 0.2 mg via ORAL
  Filled 2015-01-22: qty 1

## 2015-01-22 MED ORDER — PENTAFLUOROPROP-TETRAFLUOROETH EX AERO: 1.0000 "application " | INHALATION_SPRAY | CUTANEOUS | Status: DC | PRN

## 2015-01-22 MED ORDER — LIDOCAINE-PRILOCAINE 2.5-2.5 % EX CREA
1.0000 "application " | TOPICAL_CREAM | CUTANEOUS | Status: DC | PRN
  Filled 2015-01-22: qty 5

## 2015-01-22 MED ORDER — HEPARIN SODIUM (PORCINE) 1000 UNIT/ML DIALYSIS: 1000.0000 [IU] | INTRAMUSCULAR | Status: DC | PRN

## 2015-01-22 MED ORDER — HYDRALAZINE HCL 20 MG/ML IJ SOLN
10.0000 mg | INTRAMUSCULAR | Status: DC | PRN
  Administered 2015-01-22: 10 mg via INTRAVENOUS
  Administered 2015-01-23: 20 mg via INTRAVENOUS
  Administered 2015-01-24: 40 mg via INTRAVENOUS
  Filled 2015-01-22: qty 1
  Filled 2015-01-22: qty 2
  Filled 2015-01-22: qty 1

## 2015-01-22 MED ORDER — HYDRALAZINE HCL 50 MG PO TABS
75.0000 mg | ORAL_TABLET | Freq: Three times a day (TID) | ORAL | Status: DC
  Administered 2015-01-22: 75 mg via ORAL
  Filled 2015-01-22 (×2): qty 1

## 2015-01-22 MED ORDER — LABETALOL HCL 200 MG PO TABS
200.0000 mg | ORAL_TABLET | Freq: Two times a day (BID) | ORAL | Status: DC
  Administered 2015-01-22: 200 mg via ORAL
  Filled 2015-01-22: qty 1

## 2015-01-22 MED ORDER — FAMOTIDINE IN NACL 20-0.9 MG/50ML-% IV SOLN
20.0000 mg | INTRAVENOUS | Status: AC
  Administered 2015-01-23 – 2015-01-24 (×2): 20 mg via INTRAVENOUS
  Filled 2015-01-22 (×2): qty 50

## 2015-01-22 MED ORDER — ALTEPLASE 2 MG IJ SOLR
2.0000 mg | Freq: Once | INTRAMUSCULAR | Status: AC | PRN
  Filled 2015-01-22: qty 2

## 2015-01-22 NOTE — Telephone Encounter (Signed)
I called medical ICU at [facility] [address] and spoke with the RN who had this patient. ( I did not get her name). I checked Elinora's stool cards today and 2 of 3 cards were positive for blood.  RN notified

## 2015-01-22 NOTE — Progress Notes (Signed)
Pt had hypotensive and bradycardic episode between 0900 and 0930. HD nurse readministered fluid to elevate pressure, propofol turned off and patient stimulated with mouth care. Blood pressure and heart rate stabilized quickly. Pt became agitated and propofol restarted at slower rate. Will continue to monitor.

## 2015-01-22 NOTE — Progress Notes (Signed)
CRITICAL VALUE ALERT  Critical value received: K 6.3   Date of notification:  7/14  Time of notification: 0521  Critical value read back: Yes  Nurse who received alert: Laurena Spies RN   MD notified (1st page): MD [facility]  Time of first page: 0530  MD notified (2nd page):  Time of second page:  Responding MD: MD Kenyon Ana  Time MD responded:  [phone]

## 2015-01-22 NOTE — Progress Notes (Signed)
Pt belongings (1 cross necklace, 4 rings (3 gold, 1 sterling silver)) given to husband at beside.

## 2015-01-22 NOTE — Progress Notes (Signed)
Referring Physician(s): Patel,Balubhai N  Subjective:  L arm dialysis access clotted Attempt at declot in IR 7/13 Become unresponsive on table Intubated and now in ICU with PCCM On vent  No response   Allergies: Aspirin; Contrast media; Iron; Macrodantin; Penicillins; Plavix; Amlodipine; Bactrim; Sulfa antibiotics; Venofer; Dexilant; Morphine and related; Prilosec; Levaquin; and Protonix  Medications: Prior to Admission medications   Medication Sig Start Date End Date Taking? Authorizing Provider  acetaminophen (TYLENOL) 325 MG tablet Take 2 tablets (650 mg total) by mouth every 6 (six) hours as needed for mild pain (mild pain). 10/02/14  Yes Erline Hau, MD  ALPRAZolam Duanne Moron) 0.5 MG tablet Take 0.5 mg by mouth 3 (three) times daily.   Yes Historical Provider, MD  aspirin EC 81 MG tablet Take 162 mg by mouth daily.    Yes Historical Provider, MD  benzonatate (TESSALON PERLES) 100 MG capsule Take 1 capsule (100 mg total) by mouth 3 (three) times daily as needed for cough. 10/02/14  Yes Erline Hau, MD  cloNIDine (CATAPRES) 0.3 MG tablet Take 1 tablet (0.3 mg total) by mouth 2 (two) times daily. 12/19/14  Yes Herminio Commons, MD  dextromethorphan-guaiFENesin [facility] DM) 30-600 MG per 12 hr tablet Take 1 tablet by mouth daily. Patient taking differently: Take 1 tablet by mouth 3 (three) times daily as needed.  09/29/14  Yes Fredia Sorrow, MD  fluticasone (FLONASE) 50 MCG/ACT nasal spray Place 2 sprays into the nose daily as needed for allergies.    Yes Historical Provider, MD  folic acid-vitamin b complex-vitamin c-selenium-zinc (DIALYVITE) 3 MG TABS Take 1 tablet by mouth daily.   Yes Historical Provider, MD  hydrALAZINE (APRESOLINE) 50 MG tablet Take 1.5 tablets (75 mg total) by mouth 3 (three) times daily. 01/19/15  Yes Herminio Commons, MD  labetalol (NORMODYNE) 200 MG tablet Take 200 mg by mouth 2 (two) times daily.   Yes Historical Provider, MD    lanthanum (FOSRENOL) 1000 MG chewable tablet Chew 1,000 mg by mouth 3 (three) times daily after meals.   Yes Historical Provider, MD  lidocaine-prilocaine (EMLA) cream Apply 1 application topically daily as needed (fo access).  11/29/14  Yes Historical Provider, MD  loratadine (CLARITIN) 10 MG tablet Take 10 mg by mouth daily.    Yes Historical Provider, MD  nitroGLYCERIN (NITROSTAT) 0.4 MG SL tablet Place 1 tablet (0.4 mg total) under the tongue every 5 (five) minutes x 3 doses as needed for chest pain. 06/23/14  Yes Herminio Commons, MD  omeprazole (PRILOSEC) 20 MG capsule Take 1 capsule (20 mg total) by mouth daily. 02/10/14  Yes Ladene Artist, MD  polyethylene glycol [facility] Floria Raveling) packet Take 17 g by mouth daily. Patient taking differently: Take 17 g by mouth daily as needed for mild constipation or moderate constipation.  10/02/14  Yes Estela Leonie Green, MD  SENSIPAR 30 MG tablet Take 30 mg by mouth daily with supper.  12/21/12  Yes Historical Provider, MD  simvastatin (ZOCOR) 20 MG tablet Take 1 tablet (20 mg total) by mouth at bedtime. 11/04/14  Yes Herminio Commons, MD  tetrahydrozoline 0.05 % ophthalmic solution Place 1 drop into both eyes 2 (two) times daily as needed (irritation).   Yes Historical Provider, MD  amLODipine (NORVASC) 10 MG tablet Take 10 mg by mouth daily. 11/01/14   Historical Provider, MD  dexamethasone (DECADRON) 0.1 % ophthalmic solution Place 1 drop into both eyes.  01/20/15   Historical  Provider, MD  isosorbide dinitrate (ISORDIL) 20 MG tablet Take 20 mg by mouth 3 (three) times daily. 01/15/15   Historical Provider, MD  isosorbide dinitrate (ISORDIL) 30 MG tablet Take 1 tablet (30 mg total) by mouth 3 (three) times daily. Patient not taking: Reported on 01/22/2015 10/02/14   Erline Hau, MD  PROAIR HFA [phone] BASE) MCG/ACT inhaler Inhale 1 puff into the lungs every 6 (six) hours as needed for wheezing or shortness of breath.  01/09/15    Historical Provider, MD  triamcinolone cream (KENALOG) 0.1 % Apply 1 application topically 2 (two) times daily.  11/05/14   Historical Provider, MD     Vital Signs: BP 175/53 mmHg  Pulse 84  Temp(Src) 97.9 F (36.6 C) (Oral)  Resp 18  Ht 5' 2" (1.575 m)  Wt 143 lb 8.3 oz (65.1 kg)  BMI 26.24 kg/m2  SpO2 100%  Physical Exam  Pulmonary/Chest: She is in respiratory distress.  On vent  Abdominal:  Dialysis cath in L groin  Musculoskeletal:  No response  Skin: Skin is warm.  Nursing note and vitals reviewed.   Imaging: Ir Pta Venous Left  01/22/2015   CLINICAL DATA:  74 year old female with a history of longstanding renal failure. 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 She has been referred for evaluation and treatment.  Brachycephalic fistula was placed 5 years previously. She does not recall many prior treatments.  EXAM: DIALYSIS GRAFT DECLOT  VENOUS ANGIOPLASTY  ULTRASOUND GUIDANCE FOR VASCULAR ACCESS  MEDICATIONS: 3.5 mg Versed, 162 mcg fentanyl  40 mg IV labetalol  3000 units heparin  4 mg tPA  FLUOROSCOPY TIME:  37 minutes  TECHNIQUE: The procedure, risks, benefits, and alternatives were explained to the patient and the patient's family, including bleeding, infection, arterial thrombus, venous thrombus, vessel injury, need for further procedure, need for stenting, contrast reaction, need for catheter placement, cardiopulmonary collapse, death. Questions regarding the procedure were encouraged and answered. The patient understands and consents to the procedure.  Ultrasound survey was performed with images stored and sent to PACs.  The left upper extremity was prepped and draped in the usual sterile fashion.  Ultrasound guidance was used to access the upper extremity fistula with a micropuncture kit. Exchange was made for a 6 [place] short sheath.  A combination of a glide catheter, multipurpose angled catheter, Bentson wire and Glidewire was used to  navigate centrally through several tandem stenoses.  Venogram of the left upper extremity demonstrates patency of the brachycephalic vein  Upon withdrawal of the angled catheter, a solution of 2 milligrams of tPA in saline was infused into the outflow portion of the graft.  Multiple balloon angioplasty was performed including at the cephalic arch, and 3 stenoses of the venous outflow, including region of occlusion just beyond the aneurysm of the left upper extremity.  Cephalic arch was angioplastied to a diameter of 14 mm.  Repeat fistulogram was then performed.  Catheters and wires were removed with a complete fistulagram performed through the central vasculature.  No residual stenosis was identified in the venous outflow. No residual stenosis at the arterial anastomosis.  The short sheaths were removed after placement of hemostasis sutures.  Patient tolerated the procedure well and remained hemodynamically stable throughout.  No complications were encountered and no significant blood loss was encountered.  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 Near complete occlusions at several regions of the outflow were angioplasty to  as great is 14 mm.  At the completion of the study there was a thrill palpated at the fistula, with no residual stenosis of the outflow. There was mild residual thrombus within the aneurysmal segment of the outflow.  Arterial anastomosis widely patent.  IMPRESSION: Status post mechanical and pharmacologic thrombectomy/ thrombolysis of left upper extremity brachycephalic fistula, with balloon angioplasty of multiple tandem stenosis of the outflow.  Signed,  Dulcy Fanny. Earleen Newport, DO  [facility]  [facility] Radiology  ACCESS: Should this access re- thrombosis, it may be difficult to navigate through the aneurysm. Consideration of surgical revision may be considered.   Electronically Signed   By: Corrie Mckusick  D.O.   On: 01/22/2015 08:24   Ir US Guide Vasc Access Left  01/22/2015   CLINICAL DATA:  74 year old female with a history of longstanding renal failure. 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 She has been referred for evaluation and treatment.  Brachycephalic fistula was placed 5 years previously. She does not recall many prior treatments.  EXAM: DIALYSIS GRAFT DECLOT  VENOUS ANGIOPLASTY  ULTRASOUND GUIDANCE FOR VASCULAR ACCESS  MEDICATIONS: 3.5 mg Versed, 162 mcg fentanyl  40 mg IV labetalol  3000 units heparin  4 mg tPA  FLUOROSCOPY TIME:  37 minutes  TECHNIQUE: The procedure, risks, benefits, and alternatives were explained to the patient and the patient's family, including bleeding, infection, arterial thrombus, venous thrombus, vessel injury, need for further procedure, need for stenting, contrast reaction, need for catheter placement, cardiopulmonary collapse, death. Questions regarding the procedure were encouraged and answered. The patient understands and consents to the procedure.  Ultrasound survey was performed with images stored and sent to PACs.  The left upper extremity was prepped and draped in the usual sterile fashion.  Ultrasound guidance was used to access the upper extremity fistula with a micropuncture kit. Exchange was made for a 6 [place] short sheath.  A combination of a glide catheter, multipurpose angled catheter, Bentson wire and Glidewire was used to navigate centrally through several tandem stenoses.  Venogram of the left upper extremity demonstrates patency of the brachycephalic vein  Upon withdrawal of the angled catheter, a solution of 2 milligrams of tPA in saline was infused into the outflow portion of the graft.  Multiple balloon angioplasty was performed including at the cephalic arch, and 3 stenoses of the venous outflow, including region of occlusion just beyond the aneurysm of the left upper extremity.  Cephalic arch was angioplastied  to a diameter of 14 mm.  Repeat fistulogram was then performed.  Catheters and wires were removed with a complete fistulagram performed through the central vasculature.  No residual stenosis was identified in the venous outflow. No residual stenosis at the arterial anastomosis.  The short sheaths were removed after placement of hemostasis sutures.  Patient tolerated the procedure well and remained hemodynamically stable throughout.  No complications were encountered and no significant blood loss was encountered.  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 Near complete occlusions at several regions of the outflow were angioplasty to as great is 14 mm.  At the completion of the study there was a thrill palpated at the fistula, with no residual stenosis of the outflow. There was mild residual thrombus within the aneurysmal segment of the outflow.  Arterial anastomosis widely patent.  IMPRESSION: Status post mechanical and pharmacologic thrombectomy/ thrombolysis of left upper extremity brachycephalic fistula, with balloon angioplasty of multiple tandem stenosis  of the outflow.  Signed,  Dulcy Fanny. Earleen Newport, DO  [facility]  [facility] Radiology  ACCESS: Should this access re- thrombosis, it may be difficult to navigate through the aneurysm. Consideration of surgical revision may be considered.   Electronically Signed   By: Corrie Mckusick D.O.   On: 01/22/2015 08:24   Dg Chest Port 1 View  01/22/2015   CLINICAL DATA:  Respiratory failure.  EXAM: PORTABLE CHEST - 1 VIEW  COMPARISON:  01/21/2015.  FINDINGS: Endotracheal tube and NG tube in stable position. Prior CABG. Heart size normal. Pulmonary vascularity normal. Bibasilar atelectasis and/or infiltrates. Small pleural effusions. No pneumothorax.  IMPRESSION: 1. Lines and tubes in stable position. 2. Bibasilar atelectasis and/or infiltrates with small bilateral pleural effusions.  3. Prior CABG.  Heart size normal.  No pulmonary venous congestion.   Electronically Signed   By: Marcello Moores  Register   On: 01/22/2015 07:06   Dg Chest Port 1 View  01/21/2015   CLINICAL DATA:  ET tube placement  EXAM: PORTABLE CHEST - 1 VIEW  COMPARISON:  12/22/2014  FINDINGS: Endotracheal tube with the tip 4 cm above the carina. Nasogastric tube coursing below the diaphragm.  Bilateral mild interstitial thickening. Small bilateral pleural effusions. No focal consolidation or pneumothorax. Normal heart size. Prior CABG.  IMPRESSION: Endotracheal tube with the tip 4 cm above the carina.  Nasogastric tube coursing below the diaphragm.   Electronically Signed   By: Kathreen Devoid   On: 01/21/2015 21:23   Ir Declot Left Mod Sed  01/22/2015   CLINICAL DATA:  74 year old female with a history of longstanding renal failure. 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 She has been referred for evaluation and treatment.  Brachycephalic fistula was placed 5 years previously. She does not recall many prior treatments.  EXAM: DIALYSIS GRAFT DECLOT  VENOUS ANGIOPLASTY  ULTRASOUND GUIDANCE FOR VASCULAR ACCESS  MEDICATIONS: 3.5 mg Versed, 162 mcg fentanyl  40 mg IV labetalol  3000 units heparin  4 mg tPA  FLUOROSCOPY TIME:  37 minutes  TECHNIQUE: The procedure, risks, benefits, and alternatives were explained to the patient and the patient's family, including bleeding, infection, arterial thrombus, venous thrombus, vessel injury, need for further procedure, need for stenting, contrast reaction, need for catheter placement, cardiopulmonary collapse, death. Questions regarding the procedure were encouraged and answered. The patient understands and consents to the procedure.  Ultrasound survey was performed with images stored and sent to PACs.  The left upper extremity was prepped and draped in the usual sterile fashion.  Ultrasound guidance was used to access the upper extremity fistula with a  micropuncture kit. Exchange was made for a 6 [place] short sheath.  A combination of a glide catheter, multipurpose angled catheter, Bentson wire and Glidewire was used to navigate centrally through several tandem stenoses.  Venogram of the left upper extremity demonstrates patency of the brachycephalic vein  Upon withdrawal of the angled catheter, a solution of 2 milligrams of tPA in saline was infused into the outflow portion of the graft.  Multiple balloon angioplasty was performed including at the cephalic arch, and 3 stenoses of the venous outflow, including region of occlusion just beyond the aneurysm of the left upper extremity.  Cephalic arch was angioplastied to a diameter of 14 mm.  Repeat fistulogram was then performed.  Catheters and wires were removed with a complete fistulagram performed through the central vasculature.  No residual stenosis was identified in the venous outflow. No residual  stenosis at the arterial anastomosis.  The short sheaths were removed after placement of hemostasis sutures.  Patient tolerated the procedure well and remained hemodynamically stable throughout.  No complications were encountered and no significant blood loss was encountered.  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 Near complete occlusions at several regions of the outflow were angioplasty to as great is 14 mm.  At the completion of the study there was a thrill palpated at the fistula, with no residual stenosis of the outflow. There was mild residual thrombus within the aneurysmal segment of the outflow.  Arterial anastomosis widely patent.  IMPRESSION: Status post mechanical and pharmacologic thrombectomy/ thrombolysis of left upper extremity brachycephalic fistula, with balloon angioplasty of multiple tandem stenosis of the outflow.  Signed,  Dulcy Fanny. Earleen Newport, DO  [facility]  [facility]  ACCESS: Should this  access re- thrombosis, it may be difficult to navigate through the aneurysm. Consideration of surgical revision may be considered.   Electronically Signed   By: Corrie Mckusick D.O.   On: 01/22/2015 08:24    Labs:  CBC:  Recent Labs  12/13/14 0900 12/22/14 0342 02/13/2015 1613 02-13-2015 2300 01/22/15 0351  WBC 7.3 7.9  --  10.8* 9.0  HGB 10.5* 9.3* 9.9* 8.5* 7.9*  HCT 33.4* 30.2* 29.0* 26.6* 24.8*  PLT 251 300  --  216 198    COAGS: No results for input(s): INR, APTT in the last 8760 hours.  BMP:  Recent Labs  12/13/14 0900 12/22/14 0342 2015/02/13 1612 Feb 13, 2015 1613 02/13/2015 2030 01/22/15 0351  NA 139 143  --  136 135 135  K 4.0 4.9 4.9 4.9 5.6* 6.3*  CL 99* 103  --   --  98* 100*  CO2 28 23  --   --  25 22  GLUCOSE 110* 131*  --  102* 101* 104*  BUN 21* 66*  --   --  54* 53*  CALCIUM 9.5 9.8  --   --  8.8* 8.9  CREATININE 6.79* 12.09*  --   --  12.16* 11.69*  GFRNONAA 5* 3*  --   --  3* 3*  GFRAA 6* 3*  --   --  3* 3*    LIVER FUNCTION TESTS:  Recent Labs  10/01/14 0300 10/02/14 0613 10/16/14 2232 12/13/14 0900  BILITOT 0.9 0.8 0.5 1.0  AST 33 _0 ALT _1 13*  ALKPHOS 76 77 107 83  PROT 6.3 6.1 6.8 6.0*  ALBUMIN 3.5 3.3* 3.8 3.6    Assessment and Plan:  L arm dialysis access clotted Pt coded on IR table yesterday 2023-02-13 while declot in progress Now on vent --admitted to CCM Will follow  Signed: Halla Chopp A 01/22/2015, 1:53 PM   I spent a total of 15 Minutes in face to face in clinical consultation/evaluation, greater than 50% of which was counseling/coordinating care for L arm dialysis access clotted

## 2015-01-22 NOTE — Progress Notes (Signed)
eLink Physician-Brief Progress Note Patient Name: Shawna [name] DOB: Nov 17, 1940 MRN: [id number]   Date of Service  01/22/2015  HPI/Events of Note  hypertensive  eICU Interventions  Resume PO clonidine/ hydrallazine/ labetalol & use prn IV as needed     Intervention Category Major Interventions: Hypertension - evaluation and management  Carlo Guevarra V. 01/22/2015, 12:22 AM

## 2015-01-22 NOTE — Progress Notes (Signed)
I received call from pt's GI doc stating that 2 of 3 occult blood cards from most recent visit resulted positive. [facility] contacted and given this information. No stools during this shift as of 1645. Will continue to monitor.

## 2015-01-22 NOTE — Progress Notes (Signed)
Rechecked CBG, 0011 result showed 60. Rechecked and CBG 95. Will continue to monitor and assess.

## 2015-01-22 NOTE — Procedures (Signed)
Patient was seen on dialysis and the procedure was supervised. BFR 400 Via left femoral trialysis catheter BP is 176/61. Patient is intubated and sedated but appears to be tolerating treatment well.  Unable to use AVF (was able to cannulate and flush but not draw back).

## 2015-01-22 NOTE — Progress Notes (Signed)
PULMONARY / CRITICAL CARE MEDICINE   Name: Shawna [name] MRN: [id number] DOB: 1940-12-03    ADMISSION DATE:  01/21/2015 CONSULTATION DATE:  01/21/2015  REFERRING MD :  Earleen Newport IR  CHIEF COMPLAINT:    INITIAL PRESENTATION: 74 year old female, ESRD on HD MWF. To IR 7/13 for angioplasty of fistula. She was profoundly hypertensive throughout the procedure, and eventually became unresponsive, requiring intubation and admission to ICU.   STUDIES:  None  SIGNIFICANT EVENTS: 7/13: To IR for angioplasty of fistula and found to be hypertensive. Became unresponsive and required intubation. Admitted to ICU.  SUBJECTIVE: Patient status improved. Still sedated. Currently on continuous HD. HTN improved.  VITAL SIGNS: Temp:  [97.3 F (36.3 C)-97.9 F (36.6 C)] 97.9 F (36.6 C) (07/14 1223) Pulse Rate:  [45-112] 84 (07/14 1205) Resp:  [12-32] 15 (07/14 1145) BP: (0-253)/(0-158) 175/53 mmHg (07/14 1205) SpO2:  [88 %-100 %] 100 % (07/14 1145) FiO2 (%):  [40 %] 40 % (07/14 1142) Weight:  [143 lb 8.3 oz (65.1 kg)-148 lb 13 oz (67.5 kg)] 143 lb 8.3 oz (65.1 kg) (07/14 1130) HEMODYNAMICS:   VENTILATOR SETTINGS: Vent Mode:  [-] PRVC FiO2 (%):  [40 %] 40 % Set Rate:  [15 bmp] 15 bmp Vt Set:  [400 mL] 400 mL PEEP:  [5 cmH20] 5 cmH20 Plateau Pressure:  [8 [id number] cmH20] 8 cmH20 INTAKE / OUTPUT:  Intake/Output Summary (Last 24 hours) at 01/22/15 1224 Last data filed at 01/22/15 1130  Gross per 24 hour  Intake 905.22 ml  Output   2001 ml  Net -1095.78 ml    PHYSICAL EXAMINATION: General: Sedated, Ill appearing Neuro: Sedated, no focal deficits appreciated, RASS -2 HEENT: MMM, PERRLA, ETT in place Cardiovascular: RRR, no murmurs Lungs:  Breath sounds slightly course at bases bilaterally Abdomen:  Soft and nondistended, +BS Musculoskeletal: No edema noted, pulses palpable throughout Skin: No rashes noted  LABS:  CBC  Recent Labs Lab 01/21/15 1613 01/21/15 2300 01/22/15 0351  WBC   --  10.8* 9.0  HGB 9.9* 8.5* 7.9*  HCT 29.0* 26.6* 24.8*  PLT  --  216 198   Coag's No results for input(s): APTT, INR in the last 168 hours. BMET  Recent Labs Lab 01/21/15 1613 01/21/15 2030 01/22/15 0351  NA 136 135 135  K 4.9 5.6* 6.3*  CL  --  98* 100*  CO2  --  25 22  BUN  --  54* 53*  CREATININE  --  12.16* 11.69*  GLUCOSE 102* 101* 104*   Electrolytes  Recent Labs Lab 01/21/15 2030 01/22/15 0351  CALCIUM 8.8* 8.9  MG 1.6* 1.6*   Sepsis Markers  Recent Labs Lab 01/21/15 2030  LATICACIDVEN 1.7   ABG  Recent Labs Lab 01/21/15 2256  PHART 7.389  PCO2ART 37.8  PO2ART 100.0   Liver Enzymes No results for input(s): AST, ALT, ALKPHOS, BILITOT, ALBUMIN in the last 168 hours. Cardiac Enzymes  Recent Labs Lab 01/21/15 2030  TROPONINI 0.03   Glucose  Recent Labs Lab 01/21/15 1936 01/21/15 2036 01/22/15 0011 01/22/15 0201 01/22/15 0358  GLUCAP 212* 160* 60* 95 88    ASSESSMENT / PLAN:  PULMONARY OETT 7/13>> A: Acute Respiratory Failure, Ventilator Dependent P:   Full vent support - PEEP 5, FiO2 40%  VAP bundle SBT as tol CXR - bilat atelectasis present  CARDIOVASCULAR CVL>> A:  H/O CAD with NSTEMI in 2013 and stent placement and CABG H/O Chronic Diastolic CHF with Echo in 2015 showing EF 55-60%,  moderate LVH, grade 1 H/O Malignant Hypertension P:  DC nitro drip Clonidine 0.2mg  BID Hydralazine 75mg  TID  - as well as Q4 PRN Labetalol 200mg  BID Cardiac Monitor.  Propofol for sedation  RENAL A:   Malfunctioning AV Fistula H/O ESRD on HD MWF P:   Nephrology consult - on board HD running KVO IVF  GASTROINTESTINAL A:   H/O GI Bleed, Colon cancer P:   NPO Pepcid  HEMATOLOGIC A:   No acute inssues P:  Obtain CBC SCD  INFECTIOUS A:   No evidence of infection P:   Monitor clinically  ENDOCRINE A:   No acute issues P:   Monitor glucose on BMPs  NEUROLOGIC A:   Acute encephalopathy H/O TIA P:    Sedation: Propofol gtt / fentanyl PRN RASS goal: -1  FAMILY  - Updates: Husband at bedside 7/13  - Inter-disciplinary family meet or Palliative Care meeting due by:  01/28/15    Elberta Leatherwood, MD,MS,  PGY2 01/22/2015 12:24 PM

## 2015-01-22 NOTE — Progress Notes (Signed)
Patient ID: Shawna [name], female   DOB: 1940-10-04, 74 y.o.   MRN: [id number]  [facility] Progress Note    Subjective:   Intubated/sedated   Objective:   BP 176/61 mmHg  Pulse 64  Temp(Src) 97.4 F (36.3 C) (Oral)  Resp 15  Ht 5\' 2"  (1.575 m)  Wt 67.5 kg (148 lb 13 oz)  BMI 27.21 kg/m2  SpO2 100%  Intake/Output: I/O last 3 completed shifts: In: 666.8 [I.V.:616.8; IV Piggyback:50] Out: -    Intake/Output this shift:    Weight change:   Physical Exam: Gen:WD elderly AAF intubated CVS:no rub Resp:cta [id number] Ext:LUE AVF +T/B, minimal edema  Labs: BMET  Recent Labs Lab 01/21/15 1612 01/21/15 1613 01/21/15 2030 01/22/15 0351  NA  --  136 135 135  K 4.9 4.9 5.6* 6.3*  CL  --   --  98* 100*  CO2  --   --  25 22  GLUCOSE  --  102* 101* 104*  BUN  --   --  54* 53*  CREATININE  --   --  12.16* 11.69*  CALCIUM  --   --  8.8* 8.9   CBC  Recent Labs Lab 01/21/15 1613 01/21/15 2300 01/22/15 0351  WBC  --  10.8* 9.0  HGB 9.9* 8.5* 7.9*  HCT 29.0* 26.6* 24.8*  MCV  --  95.3 95.0  PLT  --  216 198    @IMGRELPRIORS @ Medications:    . alteplase  4 mg Intracatheter Once  . antiseptic oral rinse  7 mL Mouth Rinse QID  . chlorhexidine  15 mL Mouth Rinse BID  . cloNIDine  0.2 mg Per Tube BID  . famotidine (PEPCID) IV  20 mg Intravenous Q12H  . hydrALAZINE  75 mg Per Tube 3 times per day  . labetalol  200 mg Per Tube BID   Dialysis:  [facility]: MWF, 2K/2.5Ca, BFR300 DFR 600, duration 3 hours, EDW 63.5kg, 2000 heparin bolus and 200/hr infusion  Assessment/ Plan:   1. Respiratory Arrest- following declot/sedation/beta blocker.  S/p intubation and appears stable 2. ESRD- currently on HD (schedule of MWF) 3. Anemia:drop in Hgb. Will contact outpt HD for esa dose and transfuse prn 4. CKD-MBD: Ca stable, will need to check phos 5. Nutrition:per primary 6. Hypertension: starting to respond to HD and UF, hold off on ntg gtt until after  HD. 7. Vascular access- s/p PTA to multiple outflow stenoses, +T/B and able to cannulate/flush but not withdraw.  Shawna [name] A 01/22/2015, 8:18 AM

## 2015-01-23 LAB — GLUCOSE, CAPILLARY
GLUCOSE-CAPILLARY: 72 mg/dL (ref 65–99)
GLUCOSE-CAPILLARY: 77 mg/dL (ref 65–99)
GLUCOSE-CAPILLARY: 77 mg/dL (ref 65–99)
GLUCOSE-CAPILLARY: 79 mg/dL (ref 65–99)
GLUCOSE-CAPILLARY: 85 mg/dL (ref 65–99)
Glucose-Capillary: 85 mg/dL (ref 65–99)
Glucose-Capillary: 83 mg/dL (ref 65–99)
Glucose-Capillary: 90 mg/dL (ref 65–99)
Glucose-Capillary: 75 mg/dL (ref 65–99)

## 2015-01-23 LAB — RENAL FUNCTION PANEL
ANION GAP: 15 (ref 5–15)
Albumin: 3.1 g/dL — ABNORMAL LOW (ref 3.5–5.0)
BUN: 27 mg/dL — ABNORMAL HIGH (ref 6–20)
CALCIUM: 9.1 mg/dL (ref 8.9–10.3)
CO2: 23 mmol/L (ref 22–32)
Chloride: 99 mmol/L — ABNORMAL LOW (ref 101–111)
Creatinine, Ser: 7.83 mg/dL — ABNORMAL HIGH (ref 0.44–1.00)
GFR calc Af Amer: 5 mL/min — ABNORMAL LOW (ref >60)
GFR calc non Af Amer: 4 mL/min — ABNORMAL LOW (ref >60)
Glucose, Bld: 89 mg/dL (ref 65–99)
Phosphorus: 5.3 mg/dL — ABNORMAL HIGH (ref 2.5–4.6)
Potassium: 4.3 mmol/L (ref 3.5–5.1)
Sodium: 137 mmol/L (ref 135–145)

## 2015-01-23 LAB — CBC
HCT: 28 % — ABNORMAL LOW (ref 36.0–46.0)
Hemoglobin: 8.6 g/dL — ABNORMAL LOW (ref 12.0–15.0)
MCH: 30.1 pg (ref 26.0–34.0)
MCHC: 30.7 g/dL (ref 30.0–36.0)
MCV: 97.9 fL (ref 78.0–100.0)
PLATELETS: 205 10*3/uL (ref 150–400)
RBC: 2.86 MIL/uL — AB (ref 3.87–5.11)
RDW: 17.2 % — ABNORMAL HIGH (ref 11.5–15.5)
WBC: 10.2 10*3/uL (ref 4.0–10.5)

## 2015-01-23 MED ORDER — MIDAZOLAM HCL 2 MG/2ML IJ SOLN
1.0000 mg | INTRAMUSCULAR | Status: DC | PRN
Start: 2015-01-23 — End: 2015-01-24
  Administered 2015-01-23 – 2015-01-24 (×5): 2 mg via INTRAVENOUS
  Filled 2015-01-23 (×6): qty 2

## 2015-01-23 MED FILL — Medication: Qty: 1 | Status: AC

## 2015-01-23 NOTE — Progress Notes (Signed)
Initial Nutrition Assessment  DOCUMENTATION CODES:   Not applicable  INTERVENTION:    Recommend initiate TF via OGT with Nepro at 25 ml/h and Prostat 30 ml BID on day 1; on day 2, increase to goal rate of 30 ml/h (720 ml per day) to provide 1496 kcals, 88 gm protein, 523 ml free water daily.  NUTRITION DIAGNOSIS:   Inadequate oral intake related to inability to eat as evidenced by NPO status.  GOAL:   Patient will meet greater than or equal to 90% of their needs  MONITOR:   Vent status, Labs, Weight trends  REASON FOR ASSESSMENT:   Ventilator    ASSESSMENT:   74 year old female, ESRD on HD MWF. To IR 7/13 for angioplasty of fistula. She was profoundly hypertensive throughout the procedure, and eventually became unresponsive, requiring intubation and admission to ICU.  Labs reviewed: BUN, creatinine, and phosphorus elevated  Patient is currently intubated on ventilator support MV: 6.3 L/min Temp (24hrs), Avg:99.2 F (37.3 C), Min:98.5 F (36.9 C), Max:100.5 F (38.1 C)  Propofol: 9.6 ml/hr providing 253 kcals per day. Hopeful to stop propofol today.  Diet Order:  Diet NPO time specified  Skin:  Reviewed, no issues  Last BM:  unknown  Height:   Ht Readings from Last 1 Encounters:  01/21/15 5\' 2"  (1.575 m)    Weight:   Wt Readings from Last 1 Encounters:  01/23/15 147 lb 4.3 oz (66.8 kg)    Ideal Body Weight:  50 kg  Wt Readings from Last 10 Encounters:  01/23/15 147 lb 4.3 oz (66.8 kg)  01/19/15 141 lb (63.957 kg)  12/30/14 143 lb 4.8 oz (65 kg)  12/19/14 145 lb (65.772 kg)  12/13/14 145 lb (65.772 kg)  12/08/14 145 lb (65.772 kg)  11/10/14 145 lb (65.772 kg)  10/16/14 147 lb (66.679 kg)  10/02/14 147 lb 14.4 oz (67.087 kg)  09/29/14 147 lb (66.679 kg)    BMI:  Body mass index is 26.93 kg/(m^2).  Estimated Nutritional Needs:   Kcal:  1428  Protein:  80-90 gm  Fluid:  1.2 L  EDUCATION NEEDS:   No education needs identified at this  time   Molli Barrows, [place], [place], [place] Pager [phone] After Hours Pager [phone]

## 2015-01-23 NOTE — Progress Notes (Signed)
PULMONARY / CRITICAL CARE MEDICINE   Name: Shawna [name] MRN: [id number] DOB: 11-29-1940    ADMISSION DATE:  01/21/2015 CONSULTATION DATE:  01/21/2015  REFERRING MD :  Earleen [facility] IR  CHIEF COMPLAINT:    INITIAL PRESENTATION: 74 year old female, ESRD on HD MWF. To IR 7/13 for angioplasty of fistula. She was profoundly hypertensive throughout the procedure, and eventually became unresponsive, requiring intubation and admission to ICU.   SIGNIFICANT EVENTS: 7/13: To IR for angioplasty of fistula and found to be hypertensive. Became unresponsive and required intubation. Admitted to ICU.  SUBJECTIVE: Patient status improved. Able to wake easily; does not make eye contact but answers yes/no appropriately. HTN improved.  VITAL SIGNS: Temp:  [97.9 F (36.6 C)-100.5 F (38.1 C)] 98.5 F (36.9 C) (07/15 0715) Pulse Rate:  [69-96] 83 (07/15 0900) Resp:  [12-21] 15 (07/15 0900) BP: (105-182)/(39-101) 139/46 mmHg (07/15 1108) SpO2:  [100 %] 100 % (07/15 0900) FiO2 (%):  [40 %] 40 % (07/15 1108) Weight:  [147 lb 4.3 oz (66.8 kg)] 147 lb 4.3 oz (66.8 kg) (07/15 0448) HEMODYNAMICS:   VENTILATOR SETTINGS: Vent Mode:  [-] PRVC FiO2 (%):  [40 %] 40 % Set Rate:  [15 bmp] 15 bmp Vt Set:  [400 mL] 400 mL PEEP:  [5 cmH20] 5 cmH20 Plateau Pressure:  [15 cmH20-23 cmH20] 16 cmH20 INTAKE / OUTPUT:  Intake/Output Summary (Last 24 hours) at 01/23/15 1218 Last data filed at 01/23/15 0900  Gross per 24 hour  Intake 532.32 ml  Output    500 ml  Net  32.32 ml    PHYSICAL EXAMINATION: General: Sedated, NAD, wakes and answers yes/no questions; no eye contact Neuro: Sedated, no focal deficits appreciated, RASS -2 HEENT: MMM, PERRLA, ETT in place Cardiovascular: RRR, no murmurs Lungs:  Breath sounds improved but course at bases bilaterally Abdomen:  Soft and nondistended, +BS Musculoskeletal: No edema noted, peripheral pulses palpable throughout Skin: No rashes noted  LABS:  CBC  Recent Labs Lab  01/21/15 2300 01/22/15 0351 01/23/15 0236  WBC 10.8* 9.0 10.2  HGB 8.5* 7.9* 8.6*  HCT 26.6* 24.8* 28.0*  PLT 216 198 205   Coag's No results for input(s): APTT, INR in the last 168 hours. BMET  Recent Labs Lab 01/22/15 0351 01/22/15 1400 01/23/15 0236  NA 135 140 137  K 6.3* 4.0 4.3  CL 100* 104 99*  CO2 22 26 23   BUN 53* 18 27*  CREATININE 11.69* 5.72* 7.83*  GLUCOSE 104* 98 89   Electrolytes  Recent Labs Lab 01/21/15 2030 01/22/15 0351 01/22/15 1400 01/23/15 0236  CALCIUM 8.8* 8.9 9.0 9.1  MG 1.6* 1.6* 1.7  --   PHOS  --   --  3.7 5.3*   Sepsis Markers  Recent Labs Lab 01/21/15 2030  LATICACIDVEN 1.7   ABG  Recent Labs Lab 01/21/15 2256  PHART 7.389  PCO2ART 37.8  PO2ART 100.0   Liver Enzymes  Recent Labs Lab 01/22/15 1400 01/23/15 0236  ALBUMIN 3.1* 3.1*   Cardiac Enzymes  Recent Labs Lab 01/21/15 2030  TROPONINI 0.03   Glucose  Recent Labs Lab 01/22/15 1215 01/22/15 1534 01/22/15 2025 01/22/15 2341 01/23/15 0443 01/23/15 0730  GLUCAP 72 96 72 85 85 83    ASSESSMENT / PLAN:  PULMONARY OETT 7/13>> A: Acute Respiratory Failure, Ventilator Dependent P:   Full vent support - PEEP 5, FiO2 40%  VAP bundle SBT as tol Will trial for possible extubation later today  CARDIOVASCULAR CVL>> A:  H/O  CAD with NSTEMI in 2013 and stent placement and CABG H/O Chronic Diastolic CHF with Echo in 2015 showing EF 55-60%, moderate LVH, grade 1 H/O Malignant Hypertension Widened pulse pressure - likely 2/2 antiHTN agents (reduced PVR) P:  Clonidine 0.2mg  BID Hydralazine 75mg  TID  - as well as Q4 PRN Labetalol 200mg  BID Cardiac Monitor.  Propofol for sedation - wean  RENAL A:   Malfunctioning AV Fistula H/O ESRD on HD MWF P:   Nephrology consult - on board Vascular to follow HD as needed or scheduled KVO IVF  GASTROINTESTINAL A:   H/O GI Bleed, Colon cancer P:   NPO Pepcid Pt's GI doctor informed us of 2-of-3 pos  FOBTs recently  - FOBT ordered for next stool  HEMATOLOGIC A:   Anemia (Baseline ~9-10) P:  Anema at or near baseline; monitor w/ CBCs  - likely 2/2 ESRD, but Hx Colon CA is concerning -- dilution likely plays a role as well  - FOBT SCD  INFECTIOUS A:   No evidence of infection P:   Monitor clinically  ENDOCRINE A:   No acute issues P:   Monitor glucose on BMPs  NEUROLOGIC A:   Acute encephalopathy H/O TIA P:   Sedation: versed/fentanyl PRN RASS goal: 0  FAMILY  - Updates: Husband at bedside 7/13  - Inter-disciplinary family meet or Palliative Care meeting due by:  01/28/15    Elberta Leatherwood, MD,MS,  PGY2 01/23/2015 12:18 PM

## 2015-01-23 NOTE — Progress Notes (Signed)
Patient ID: Shawna [name], female   DOB: Feb 08, 1941, 74 y.o.   MRN: [id number]  [facility] Progress Note    Subjective:   Intubated and sedated   Objective:   BP 152/43 mmHg  Pulse 87  Temp(Src) 98.5 F (36.9 C) (Oral)  Resp 15  Ht 5\' 2"  (1.575 m)  Wt 66.8 kg (147 lb 4.3 oz)  BMI 26.93 kg/m2  SpO2 100%  Intake/Output: I/O last 3 completed shifts: In: 1538.3 [I.V.:1248.3; NG/GT:240; IV Piggyback:50] Out: 2501 [Emesis/NG output:500; Other:2001]   Intake/Output this shift:    Weight change: 0.1 kg (3.5 oz)  Physical Exam: Gen:WD WN AAF intubated and sedated CVS:no rub Resp:cta [id number] Ext:LUE AVF +T/B  Labs: BMET  Recent Labs Lab 01/21/15 1612 01/21/15 1613 01/21/15 2030 01/22/15 0351 01/22/15 1400 01/23/15 0236  NA  --  136 135 135 140 137  K 4.9 4.9 5.6* 6.3* 4.0 4.3  CL  --   --  98* 100* 104 99*  CO2  --   --  25 22 26 23   GLUCOSE  --  102* 101* 104* 98 89  BUN  --   --  54* 53* 18 27*  CREATININE  --   --  12.16* 11.69* 5.72* 7.83*  ALBUMIN  --   --   --   --  3.1* 3.1*  CALCIUM  --   --  8.8* 8.9 9.0 9.1  PHOS  --   --   --   --  3.7 5.3*   CBC  Recent Labs Lab 01/21/15 1613 01/21/15 2300 01/22/15 0351 01/23/15 0236  WBC  --  10.8* 9.0 10.2  HGB 9.9* 8.5* 7.9* 8.6*  HCT 29.0* 26.6* 24.8* 28.0*  MCV  --  95.3 95.0 97.9  PLT  --  216 198 205    @IMGRELPRIORS @ Medications:    . alteplase  4 mg Intracatheter Once  . antiseptic oral rinse  7 mL Mouth Rinse QID  . chlorhexidine  15 mL Mouth Rinse BID  . cloNIDine  0.2 mg Per Tube BID  . famotidine (PEPCID) IV  20 mg Intravenous Q24H  . hydrALAZINE  75 mg Per Tube 3 times per day  . labetalol  200 mg Per Tube BID   Dialysis: [facility]: MWF, 2K/2.5Ca, BFR300 DFR 600, duration 3 hours, EDW 63.5kg, 2000 heparin bolus and 200/hr infusion  Assessment/ Plan:   1. Respiratory Arrest- following declot/sedation/beta blocker. S/p intubation and appears stable 2. ESRD-  currently on HD (schedule of MWF), will plan on HD tomorrow 3. Anemia:drop in Hgb. Will contact outpt HD for esa dose and transfuse prn 4. CKD-MBD: Ca stable, will need to check phos 5. Nutrition:per primary 6. Hypertension: starting to respond to HD and UF, hold off on ntg gtt until after HD. 7. Vascular access- s/p PTA to multiple outflow stenoses, +T/B and able to cannulate/flush but not withdraw. 1. Will try to use AVF tomorrow with HD.  Moataz Tavis A 01/23/2015, 8:52 AM

## 2015-01-23 NOTE — Progress Notes (Signed)
Referring Physician(s): Patel,Balubhai N  Subjective:  ESRD Came to IR for declot of dialysis access on 7/13 Hypertensive; dyspneic and RRT called to IR RN station Intubated and admitted to [place] on vent More responsive today   Allergies: Aspirin; Contrast media; Iron; Macrodantin; Penicillins; Plavix; Amlodipine; Bactrim; Sulfa antibiotics; Venofer; Dexilant; Morphine and related; Prilosec; Levaquin; and Protonix  Medications: Prior to Admission medications   Medication Sig Start Date End Date Taking? Authorizing Provider  acetaminophen (TYLENOL) 325 MG tablet Take 2 tablets (650 mg total) by mouth every 6 (six) hours as needed for mild pain (mild pain). 10/02/14  Yes Erline Hau, MD  ALPRAZolam Duanne Moron) 0.5 MG tablet Take 0.5 mg by mouth 3 (three) times daily.   Yes Historical Provider, MD  aspirin EC 81 MG tablet Take 162 mg by mouth daily.    Yes Historical Provider, MD  benzonatate (TESSALON PERLES) 100 MG capsule Take 1 capsule (100 mg total) by mouth 3 (three) times daily as needed for cough. 10/02/14  Yes Erline Hau, MD  cloNIDine (CATAPRES) 0.3 MG tablet Take 1 tablet (0.3 mg total) by mouth 2 (two) times daily. 12/19/14  Yes Herminio Commons, MD  dextromethorphan-guaiFENesin [facility] DM) 30-600 MG per 12 hr tablet Take 1 tablet by mouth daily. Patient taking differently: Take 1 tablet by mouth 3 (three) times daily as needed.  09/29/14  Yes Fredia Sorrow, MD  fluticasone (FLONASE) 50 MCG/ACT nasal spray Place 2 sprays into the nose daily as needed for allergies.    Yes Historical Provider, MD  folic acid-vitamin b complex-vitamin c-selenium-zinc (DIALYVITE) 3 MG TABS Take 1 tablet by mouth daily.   Yes Historical Provider, MD  hydrALAZINE (APRESOLINE) 50 MG tablet Take 1.5 tablets (75 mg total) by mouth 3 (three) times daily. 01/19/15  Yes Herminio Commons, MD  labetalol (NORMODYNE) 200 MG tablet Take 200 mg by mouth 2 (two) times daily.    Yes Historical Provider, MD  lanthanum (FOSRENOL) 1000 MG chewable tablet Chew 1,000 mg by mouth 3 (three) times daily after meals.   Yes Historical Provider, MD  lidocaine-prilocaine (EMLA) cream Apply 1 application topically daily as needed (fo access).  11/29/14  Yes Historical Provider, MD  loratadine (CLARITIN) 10 MG tablet Take 10 mg by mouth daily.    Yes Historical Provider, MD  nitroGLYCERIN (NITROSTAT) 0.4 MG SL tablet Place 1 tablet (0.4 mg total) under the tongue every 5 (five) minutes x 3 doses as needed for chest pain. 06/23/14  Yes Herminio Commons, MD  omeprazole (PRILOSEC) 20 MG capsule Take 1 capsule (20 mg total) by mouth daily. 02/10/14  Yes Ladene Artist, MD  polyethylene glycol [facility] / Floria Raveling) packet Take 17 g by mouth daily. Patient taking differently: Take 17 g by mouth daily as needed for mild constipation or moderate constipation.  10/02/14  Yes Estela Leonie Green, MD  SENSIPAR 30 MG tablet Take 30 mg by mouth daily with supper.  12/21/12  Yes Historical Provider, MD  simvastatin (ZOCOR) 20 MG tablet Take 1 tablet (20 mg total) by mouth at bedtime. 11/04/14  Yes Herminio Commons, MD  tetrahydrozoline 0.05 % ophthalmic solution Place 1 drop into both eyes 2 (two) times daily as needed (irritation).   Yes Historical Provider, MD  amLODipine (NORVASC) 10 MG tablet Take 10 mg by mouth daily. 11/01/14   Historical Provider, MD  dexamethasone (DECADRON) 0.1 % ophthalmic solution Place 1 drop into both eyes.  01/20/15  Historical Provider, MD  isosorbide dinitrate (ISORDIL) 20 MG tablet Take 20 mg by mouth 3 (three) times daily. 01/15/15   Historical Provider, MD  isosorbide dinitrate (ISORDIL) 30 MG tablet Take 1 tablet (30 mg total) by mouth 3 (three) times daily. Patient not taking: Reported on 01/22/2015 10/02/14   Estela Y Hernandez Acosta, MD  PROAIR HFA 108 (90 BASE) MCG/ACT inhaler Inhale 1 puff into the lungs every 6 (six) hours as needed for wheezing or  shortness of breath.  01/09/15   Historical Provider, MD  triamcinolone cream (KENALOG) 0.1 % Apply 1 application topically 2 (two) times daily.  11/05/14   Historical Provider, MD     Vital Signs: BP 152/43 mmHg  Pulse 87  Temp(Src) 98.5 F (36.9 C) (Oral)  Resp 15  Ht 5' 2" (1.575 m)  Wt 147 lb 4.3 oz (66.8 kg)  BMI 26.93 kg/m2  SpO2 100%  Physical Exam  Pulmonary/Chest:  vent  Musculoskeletal: Normal range of motion.  Follows commands to move all 4s  Neurological:  Can nod yes/no appropriately  Skin: Skin is warm.  Nursing note and vitals reviewed.   Imaging: Ir Pta Venous Left  01/22/2015   CLINICAL DATA:  74-year-old female with a history of longstanding renal failure. 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 She has been referred for evaluation and treatment.  Brachycephalic fistula was placed 5 years previously. She does not recall many prior treatments.  EXAM: DIALYSIS GRAFT DECLOT  VENOUS ANGIOPLASTY  ULTRASOUND GUIDANCE FOR VASCULAR ACCESS  MEDICATIONS: 3.5 mg Versed, 162 mcg fentanyl  40 mg IV labetalol  3000 units heparin  4 mg tPA  FLUOROSCOPY TIME:  37 minutes  TECHNIQUE: The procedure, risks, benefits, and alternatives were explained to the patient and the patient's family, including bleeding, infection, arterial thrombus, venous thrombus, vessel injury, need for further procedure, need for stenting, contrast reaction, need for catheter placement, cardiopulmonary collapse, death. Questions regarding the procedure were encouraged and answered. The patient understands and consents to the procedure.  Ultrasound survey was performed with images stored and sent to PACs.  The left upper extremity was prepped and draped in the usual sterile fashion.  Ultrasound guidance was used to access the upper extremity fistula with a micropuncture kit. Exchange was made for a 6 French short sheath.  A combination of a glide catheter, multipurpose angled  catheter, Bentson wire and Glidewire was used to navigate centrally through several tandem stenoses.  Venogram of the left upper extremity demonstrates patency of the brachycephalic vein  Upon withdrawal of the angled catheter, a solution of 2 milligrams of tPA in saline was infused into the outflow portion of the graft.  Multiple balloon angioplasty was performed including at the cephalic arch, and 3 stenoses of the venous outflow, including region of occlusion just beyond the aneurysm of the left upper extremity.  Cephalic arch was angioplastied to a diameter of 14 mm.  Repeat fistulogram was then performed.  Catheters and wires were removed with a complete fistulagram performed through the central vasculature.  No residual stenosis was identified in the venous outflow. No residual stenosis at the arterial anastomosis.  The short sheaths were removed after placement of hemostasis sutures.  Patient tolerated the procedure well and remained hemodynamically stable throughout.  No complications were encountered and no significant blood loss was encountered.  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 Near complete occlusions at several regions of the outflow were   angioplasty to as great is 14 mm.  At the completion of the study there was a thrill palpated at the fistula, with no residual stenosis of the outflow. There was mild residual thrombus within the aneurysmal segment of the outflow.  Arterial anastomosis widely patent.  IMPRESSION: Status post mechanical and pharmacologic thrombectomy/ thrombolysis of left upper extremity brachycephalic fistula, with balloon angioplasty of multiple tandem stenosis of the outflow.  Signed,  Dulcy Fanny. Earleen Newport, DO  [facility]  [facility]  ACCESS: Should this access re- thrombosis, it may be difficult to navigate through the aneurysm. Consideration of surgical revision may be  considered.   Electronically Signed   By: Corrie Mckusick D.O.   On: 01/22/2015 08:24   Ir US Guide Vasc Access Left  01/22/2015   CLINICAL DATA:  74 year old female with a history of longstanding renal failure. 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 She has been referred for evaluation and treatment.  Brachycephalic fistula was placed 5 years previously. She does not recall many prior treatments.  EXAM: DIALYSIS GRAFT DECLOT  VENOUS ANGIOPLASTY  ULTRASOUND GUIDANCE FOR VASCULAR ACCESS  MEDICATIONS: 3.5 mg Versed, 162 mcg fentanyl  40 mg IV labetalol  3000 units heparin  4 mg tPA  FLUOROSCOPY TIME:  37 minutes  TECHNIQUE: The procedure, risks, benefits, and alternatives were explained to the patient and the patient's family, including bleeding, infection, arterial thrombus, venous thrombus, vessel injury, need for further procedure, need for stenting, contrast reaction, need for catheter placement, cardiopulmonary collapse, death. Questions regarding the procedure were encouraged and answered. The patient understands and consents to the procedure.  Ultrasound survey was performed with images stored and sent to PACs.  The left upper extremity was prepped and draped in the usual sterile fashion.  Ultrasound guidance was used to access the upper extremity fistula with a micropuncture kit. Exchange was made for a 6 [place] short sheath.  A combination of a glide catheter, multipurpose angled catheter, Bentson wire and Glidewire was used to navigate centrally through several tandem stenoses.  Venogram of the left upper extremity demonstrates patency of the brachycephalic vein  Upon withdrawal of the angled catheter, a solution of 2 milligrams of tPA in saline was infused into the outflow portion of the graft.  Multiple balloon angioplasty was performed including at the cephalic arch, and 3 stenoses of the venous outflow, including region of occlusion just beyond the aneurysm of the  left upper extremity.  Cephalic arch was angioplastied to a diameter of 14 mm.  Repeat fistulogram was then performed.  Catheters and wires were removed with a complete fistulagram performed through the central vasculature.  No residual stenosis was identified in the venous outflow. No residual stenosis at the arterial anastomosis.  The short sheaths were removed after placement of hemostasis sutures.  Patient tolerated the procedure well and remained hemodynamically stable throughout.  No complications were encountered and no significant blood loss was encountered.  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 Near complete occlusions at several regions of the outflow were angioplasty to as great is 14 mm.  At the completion of the study there was a thrill palpated at the fistula, with no residual stenosis of the outflow. There was mild residual thrombus within the aneurysmal segment of the outflow.  Arterial anastomosis widely patent.  IMPRESSION: Status post mechanical and pharmacologic thrombectomy/ thrombolysis of left upper extremity brachycephalic fistula, with balloon angioplasty of multiple  tandem stenosis of the outflow.  Signed,  Jaime S. Wagner, DO  [facility]  [facility]  ACCESS: Should this access re- thrombosis, it may be difficult to navigate through the aneurysm. Consideration of surgical revision may be considered.   Electronically Signed   By: Jaime  Wagner D.O.   On: 01/22/2015 08:24   Dg Chest Port 1 View  01/22/2015   CLINICAL DATA:  Respiratory failure.  EXAM: PORTABLE CHEST - 1 VIEW  COMPARISON:  01/21/2015.  FINDINGS: Endotracheal tube and NG tube in stable position. Prior CABG. Heart size normal. Pulmonary vascularity normal. Bibasilar atelectasis and/or infiltrates. Small pleural effusions. No pneumothorax.  IMPRESSION: 1. Lines and tubes in stable position. 2. Bibasilar atelectasis  and/or infiltrates with small bilateral pleural effusions. 3. Prior CABG.  Heart size normal.  No pulmonary venous congestion.   Electronically Signed   By: Thomas  Register   On: 01/22/2015 07:06   Dg Chest Port 1 View  01/21/2015   CLINICAL DATA:  ET tube placement  EXAM: PORTABLE CHEST - 1 VIEW  COMPARISON:  12/22/2014  FINDINGS: Endotracheal tube with the tip 4 cm above the carina. Nasogastric tube coursing below the diaphragm.  Bilateral mild interstitial thickening. Small bilateral pleural effusions. No focal consolidation or pneumothorax. Normal heart size. Prior CABG.  IMPRESSION: Endotracheal tube with the tip 4 cm above the carina.  Nasogastric tube coursing below the diaphragm.   Electronically Signed   By: Hetal  Patel   On: 01/21/2015 21:23   Ir Declot Left Mod Sed  01/22/2015   CLINICAL DATA:  74-year-old female with a history of longstanding renal failure. 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 She has been referred for evaluation and treatment.  Brachycephalic fistula was placed 5 years previously. She does not recall many prior treatments.  EXAM: DIALYSIS GRAFT DECLOT  VENOUS ANGIOPLASTY  ULTRASOUND GUIDANCE FOR VASCULAR ACCESS  MEDICATIONS: 3.5 mg Versed, 162 mcg fentanyl  40 mg IV labetalol  3000 units heparin  4 mg tPA  FLUOROSCOPY TIME:  37 minutes  TECHNIQUE: The procedure, risks, benefits, and alternatives were explained to the patient and the patient's family, including bleeding, infection, arterial thrombus, venous thrombus, vessel injury, need for further procedure, need for stenting, contrast reaction, need for catheter placement, cardiopulmonary collapse, death. Questions regarding the procedure were encouraged and answered. The patient understands and consents to the procedure.  Ultrasound survey was performed with images stored and sent to PACs.  The left upper extremity was prepped and draped in the usual sterile fashion.  Ultrasound  guidance was used to access the upper extremity fistula with a micropuncture kit. Exchange was made for a 6 French short sheath.  A combination of a glide catheter, multipurpose angled catheter, Bentson wire and Glidewire was used to navigate centrally through several tandem stenoses.  Venogram of the left upper extremity demonstrates patency of the brachycephalic vein  Upon withdrawal of the angled catheter, a solution of 2 milligrams of tPA in saline was infused into the outflow portion of the graft.  Multiple balloon angioplasty was performed including at the cephalic arch, and 3 stenoses of the venous outflow, including region of occlusion just beyond the aneurysm of the left upper extremity.  Cephalic arch was angioplastied to a diameter of 14 mm.  Repeat fistulogram was then performed.  Catheters and wires were removed with a complete fistulagram performed through the central vasculature.  No residual stenosis was identified in the venous outflow.   No residual stenosis at the arterial anastomosis.  The short sheaths were removed after placement of hemostasis sutures.  Patient tolerated the procedure well and remained hemodynamically stable throughout.  No complications were encountered and no significant blood loss was encountered.  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 Near complete occlusions at several regions of the outflow were angioplasty to as great is 14 mm.  At the completion of the study there was a thrill palpated at the fistula, with no residual stenosis of the outflow. There was mild residual thrombus within the aneurysmal segment of the outflow.  Arterial anastomosis widely patent.  IMPRESSION: Status post mechanical and pharmacologic thrombectomy/ thrombolysis of left upper extremity brachycephalic fistula, with balloon angioplasty of multiple tandem stenosis of the outflow.  Signed,  Dulcy Fanny. Earleen Newport, DO  [facility]  [facility]  ACCESS: Should this access re- thrombosis, it may be difficult to navigate through the aneurysm. Consideration of surgical revision may be considered.   Electronically Signed   By: Corrie Mckusick D.O.   On: 01/22/2015 08:24    Labs:  CBC:  Recent Labs  12/22/14 0342 01/21/15 1613 01/21/15 2300 01/22/15 0351 01/23/15 0236  WBC 7.9  --  10.8* 9.0 10.2  HGB 9.3* 9.9* 8.5* 7.9* 8.6*  HCT 30.2* 29.0* 26.6* 24.8* 28.0*  PLT 300  --  216 198 205    COAGS: No results for input(s): INR, APTT in the last 8760 hours.  BMP:  Recent Labs  01/21/15 2030 01/22/15 0351 01/22/15 1400 01/23/15 0236  NA 135 135 140 137  K 5.6* 6.3* 4.0 4.3  CL 98* 100* 104 99*  CO2 _0 GLUCOSE 101* 104* 98 89  BUN 54* 53* 18 27*  CALCIUM 8.8* 8.9 9.0 9.1  CREATININE 12.16* 11.69* 5.72* 7.83*  GFRNONAA 3* 3* 7* 4*  GFRAA 3* 3* 8* 5*    LIVER FUNCTION TESTS:  Recent Labs  10/01/14 0300 10/02/14 0613 10/16/14 2232 12/13/14 0900 01/22/15 1400 01/23/15 0236  BILITOT 0.9 0.8 0.5 1.0  --   --   AST 33 _1 --   --   ALT _2 13*  --   --   ALKPHOS 76 77 107 83  --   --   PROT 6.3 6.1 6.8 6.0*  --   --   ALBUMIN 3.5 3.3* 3.8 3.6 3.1* 3.1*    Assessment and Plan:  Will follow pt Plans all per CCM  Signed: Paw Karstens A 01/23/2015, 9:25 AM   I spent a total of 15 Minutes in face to face in clinical consultation/evaluation, greater than 50% of which was counseling/coordinating care for declot dialysis access

## 2015-01-24 LAB — RENAL FUNCTION PANEL
ANION GAP: 17 — AB (ref 5–15)
Albumin: 2.8 g/dL — ABNORMAL LOW (ref 3.5–5.0)
Albumin: 3.3 g/dL — ABNORMAL LOW (ref 3.5–5.0)
Anion gap: 18 — ABNORMAL HIGH (ref 5–15)
BUN: 44 mg/dL — AB (ref 6–20)
BUN: 19 mg/dL (ref 6–20)
CALCIUM: 9.3 mg/dL (ref 8.9–10.3)
CHLORIDE: 101 mmol/L (ref 101–111)
CHLORIDE: 102 mmol/L (ref 101–111)
CO2: 19 mmol/L — ABNORMAL LOW (ref 22–32)
CO2: 22 mmol/L (ref 22–32)
CREATININE: 5.47 mg/dL — AB (ref 0.44–1.00)
Calcium: 8.9 mg/dL (ref 8.9–10.3)
Creatinine, Ser: 10.6 mg/dL — ABNORMAL HIGH (ref 0.44–1.00)
GFR calc Af Amer: 8 mL/min — ABNORMAL LOW (ref >60)
GFR calc non Af Amer: 3 mL/min — ABNORMAL LOW (ref >60)
GFR calc non Af Amer: 7 mL/min — ABNORMAL LOW (ref >60)
GFR, EST AFRICAN AMERICAN: 4 mL/min — AB (ref >60)
GLUCOSE: 80 mg/dL (ref 65–99)
GLUCOSE: 87 mg/dL (ref 65–99)
Phosphorus: 7.5 mg/dL — ABNORMAL HIGH (ref 2.5–4.6)
Phosphorus: 5.1 mg/dL — ABNORMAL HIGH (ref 2.5–4.6)
Potassium: 4.7 mmol/L (ref 3.5–5.1)
Potassium: 3.9 mmol/L (ref 3.5–5.1)
Sodium: 137 mmol/L (ref 135–145)
Sodium: 142 mmol/L (ref 135–145)

## 2015-01-24 LAB — CBC
HEMATOCRIT: 24 % — AB (ref 36.0–46.0)
HEMOGLOBIN: 7.4 g/dL — AB (ref 12.0–15.0)
MCH: 30.2 pg (ref 26.0–34.0)
MCHC: 30.8 g/dL (ref 30.0–36.0)
MCV: 98 fL (ref 78.0–100.0)
PLATELETS: 207 10*3/uL (ref 150–400)
RBC: 2.45 MIL/uL — AB (ref 3.87–5.11)
RDW: 17 % — ABNORMAL HIGH (ref 11.5–15.5)
WBC: 10 10*3/uL (ref 4.0–10.5)

## 2015-01-24 LAB — GLUCOSE, CAPILLARY: GLUCOSE-CAPILLARY: 84 mg/dL (ref 65–99)

## 2015-01-24 MED ORDER — HEPARIN SODIUM (PORCINE) 1000 UNIT/ML DIALYSIS: 20.0000 [IU]/kg | INTRAMUSCULAR | Status: DC | PRN

## 2015-01-24 MED ORDER — CLONIDINE HCL 0.3 MG PO TABS
0.3000 mg | ORAL_TABLET | Freq: Two times a day (BID) | ORAL | Status: DC
  Administered 2015-01-24 – 2015-01-25 (×2): 0.3 mg
  Filled 2015-01-24 (×3): qty 1

## 2015-01-24 MED ORDER — FAMOTIDINE 40 MG/5ML PO SUSR
20.0000 mg | Freq: Every day | ORAL | Status: DC
  Administered 2015-01-25: 20 mg
  Filled 2015-01-24: qty 2.5

## 2015-01-24 MED ORDER — NITROGLYCERIN 2 % TD OINT
1.0000 [in_us] | TOPICAL_OINTMENT | Freq: Four times a day (QID) | TRANSDERMAL | Status: DC
  Administered 2015-01-24 – 2015-01-25 (×2): 1 [in_us] via TOPICAL
  Filled 2015-01-24: qty 30

## 2015-01-24 NOTE — Progress Notes (Signed)
[facility] Progress Note Patient Name: Shawna [name] DOB: Jan 29, 1941 MRN: [id number]   Date of Service  01/24/2015  HPI/Events of Note  BP = 200/83. Patient is already on Catapres, Hydralazine PO and Hydralazine IV PRN.   eICU Interventions  Will order: 1. Nitroglycerin ointment 2% 1 inch to chest wall Q 6 hours.  2. Increase Catapres to 0.3 mg PO BID.     Intervention Category Major Interventions: Hypertension - evaluation and management  Ovella Manygoats [place] 01/24/2015, 7:50 PM

## 2015-01-24 NOTE — Progress Notes (Signed)
Referring Physician(s): Patel,Balubhai N  Subjective:  Was scheduled for IR procedure ---declot of dialysis access 7/13 Rapid response called for hypertensive event Intubated and admitted too CCM Now much better On vent but much more alert Able to nod appropriate and moving all 4s  Allergies: Aspirin; Contrast media; Iron; Macrodantin; Penicillins; Plavix; Amlodipine; Bactrim; Dexilant; Morphine and related; Prilosec; Sulfa antibiotics; Venofer; Levaquin; and Protonix  Medications: Prior to Admission medications   Medication Sig Start Date End Date Taking? Authorizing Provider  acetaminophen (TYLENOL) 325 MG tablet Take 2 tablets (650 mg total) by mouth every 6 (six) hours as needed for mild pain (mild pain). 10/02/14  Yes Erline Hau, MD  ALPRAZolam Duanne Moron) 0.5 MG tablet Take 0.5 mg by mouth 3 (three) times daily.   Yes Historical Provider, MD  amLODipine (NORVASC) 10 MG tablet Take 10 mg by mouth daily. 11/01/14  Yes Historical Provider, MD  aspirin EC 81 MG tablet Take 81 mg by mouth daily.    Yes Historical Provider, MD  benzonatate (TESSALON PERLES) 100 MG capsule Take 1 capsule (100 mg total) by mouth 3 (three) times daily as needed for cough. 10/02/14  Yes Erline Hau, MD  cloNIDine (CATAPRES) 0.3 MG tablet Take 1 tablet (0.3 mg total) by mouth 2 (two) times daily. 12/19/14  Yes Herminio Commons, MD  dextromethorphan-guaiFENesin [facility] DM) 30-600 MG per 12 hr tablet Take 1 tablet by mouth daily. Patient taking differently: Take 1 tablet by mouth 3 (three) times daily as needed.  09/29/14  Yes Fredia Sorrow, MD  fluticasone (FLONASE) 50 MCG/ACT nasal spray Place 2 sprays into the nose daily as needed for allergies.    Yes Historical Provider, MD  folic acid-vitamin b complex-vitamin c-selenium-zinc (DIALYVITE) 3 MG TABS Take 1 tablet by mouth daily.   Yes Historical Provider, MD  hydrALAZINE (APRESOLINE) 50 MG tablet Take 1.5 tablets (75 mg total)  by mouth 3 (three) times daily. 01/19/15  Yes Herminio Commons, MD  isosorbide dinitrate (ISORDIL) 20 MG tablet Take 20 mg by mouth 3 (three) times daily. 01/15/15  Yes Historical Provider, MD  labetalol (NORMODYNE) 200 MG tablet Take 200 mg by mouth 2 (two) times daily.   Yes Historical Provider, MD  lanthanum (FOSRENOL) 1000 MG chewable tablet Chew 1,000 mg by mouth 3 (three) times daily after meals.   Yes Historical Provider, MD  lidocaine-prilocaine (EMLA) cream Apply 1 application topically daily as needed (for port access).  11/29/14  Yes Historical Provider, MD  loratadine (CLARITIN) 10 MG tablet Take 10 mg by mouth daily.    Yes Historical Provider, MD  nitroGLYCERIN (NITROSTAT) 0.4 MG SL tablet Place 1 tablet (0.4 mg total) under the tongue every 5 (five) minutes x 3 doses as needed for chest pain. 06/23/14  Yes Herminio Commons, MD  omeprazole (PRILOSEC) 20 MG capsule Take 1 capsule (20 mg total) by mouth daily. 02/10/14  Yes Ladene Artist, MD  polyethylene glycol [facility] / Floria Raveling) packet Take 17 g by mouth daily. Patient taking differently: Take 17 g by mouth daily as needed for mild constipation or moderate constipation.  10/02/14  Yes Estela Leonie Green, MD  PROAIR HFA 108 [phone] BASE) MCG/ACT inhaler Inhale 1 puff into the lungs every 6 (six) hours as needed for wheezing or shortness of breath.  01/09/15  Yes Historical Provider, MD  SENSIPAR 30 MG tablet Take 30 mg by mouth daily with supper.  12/21/12  Yes Historical Provider, MD  simvastatin (ZOCOR) 20 MG tablet Take 1 tablet (20 mg total) by mouth at bedtime. 11/04/14  Yes Herminio Commons, MD  tetrahydrozoline 0.05 % ophthalmic solution Place 1 drop into both eyes 2 (two) times daily as needed (irritation).   Yes Historical Provider, MD  dexamethasone (DECADRON) 0.1 % ophthalmic solution Place 1 drop into both eyes.  01/20/15   Historical Provider, MD  isosorbide dinitrate (ISORDIL) 30 MG tablet Take 1 tablet (30 mg total) by  mouth 3 (three) times daily. Patient not taking: Reported on 01/22/2015 10/02/14   Erline Hau, MD     Vital Signs: BP 134/48 mmHg  Pulse 80  Temp(Src) 99 F (37.2 C) (Oral)  Resp 17  Ht 5' 2"  (1.575 m)  Wt 146 lb 6.2 oz (66.4 kg)  BMI 26.77 kg/m2  SpO2 100%  Physical Exam  Musculoskeletal: Normal range of motion.  Neurological:  More alert daily Still on vent Responding to questions and Nods appropriately  Nursing note and vitals reviewed.   Imaging: Ir Pta Venous Left  01/22/2015   CLINICAL DATA:  74 year old female with a history of longstanding renal failure. 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 She has been referred for evaluation and treatment.  Brachycephalic fistula was placed 5 years previously. She does not recall many prior treatments.  EXAM: DIALYSIS GRAFT DECLOT  VENOUS ANGIOPLASTY  ULTRASOUND GUIDANCE FOR VASCULAR ACCESS  MEDICATIONS: 3.5 mg Versed, 162 mcg fentanyl  40 mg IV labetalol  3000 units heparin  4 mg tPA  FLUOROSCOPY TIME:  37 minutes  TECHNIQUE: The procedure, risks, benefits, and alternatives were explained to the patient and the patient's family, including bleeding, infection, arterial thrombus, venous thrombus, vessel injury, need for further procedure, need for stenting, contrast reaction, need for catheter placement, cardiopulmonary collapse, death. Questions regarding the procedure were encouraged and answered. The patient understands and consents to the procedure.  Ultrasound survey was performed with images stored and sent to PACs.  The left upper extremity was prepped and draped in the usual sterile fashion.  Ultrasound guidance was used to access the upper extremity fistula with a micropuncture kit. Exchange was made for a 6 [place] short sheath.  A combination of a glide catheter, multipurpose angled catheter, Bentson wire and Glidewire was used to navigate centrally through several tandem stenoses.   Venogram of the left upper extremity demonstrates patency of the brachycephalic vein  Upon withdrawal of the angled catheter, a solution of 2 milligrams of tPA in saline was infused into the outflow portion of the graft.  Multiple balloon angioplasty was performed including at the cephalic arch, and 3 stenoses of the venous outflow, including region of occlusion just beyond the aneurysm of the left upper extremity.  Cephalic arch was angioplastied to a diameter of 14 mm.  Repeat fistulogram was then performed.  Catheters and wires were removed with a complete fistulagram performed through the central vasculature.  No residual stenosis was identified in the venous outflow. No residual stenosis at the arterial anastomosis.  The short sheaths were removed after placement of hemostasis sutures.  Patient tolerated the procedure well and remained hemodynamically stable throughout.  No complications were encountered and no significant blood loss was encountered.  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 Near complete occlusions at several regions of the outflow were angioplasty to as great is 14 mm.  At the completion of the study there was a thrill palpated at  the fistula, with no residual stenosis of the outflow. There was mild residual thrombus within the aneurysmal segment of the outflow.  Arterial anastomosis widely patent.  IMPRESSION: Status post mechanical and pharmacologic thrombectomy/ thrombolysis of left upper extremity brachycephalic fistula, with balloon angioplasty of multiple tandem stenosis of the outflow.  Signed,  Dulcy Fanny. Earleen Newport, DO  [facility]  [facility] Radiology  ACCESS: Should this access re- thrombosis, it may be difficult to navigate through the aneurysm. Consideration of surgical revision may be considered.   Electronically Signed   By: Corrie Mckusick D.O.   On: 01/22/2015 08:24   Ir US Guide Vasc  Access Left  01/22/2015   CLINICAL DATA:  74 year old female with a history of longstanding renal failure. 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 She has been referred for evaluation and treatment.  Brachycephalic fistula was placed 5 years previously. She does not recall many prior treatments.  EXAM: DIALYSIS GRAFT DECLOT  VENOUS ANGIOPLASTY  ULTRASOUND GUIDANCE FOR VASCULAR ACCESS  MEDICATIONS: 3.5 mg Versed, 162 mcg fentanyl  40 mg IV labetalol  3000 units heparin  4 mg tPA  FLUOROSCOPY TIME:  37 minutes  TECHNIQUE: The procedure, risks, benefits, and alternatives were explained to the patient and the patient's family, including bleeding, infection, arterial thrombus, venous thrombus, vessel injury, need for further procedure, need for stenting, contrast reaction, need for catheter placement, cardiopulmonary collapse, death. Questions regarding the procedure were encouraged and answered. The patient understands and consents to the procedure.  Ultrasound survey was performed with images stored and sent to PACs.  The left upper extremity was prepped and draped in the usual sterile fashion.  Ultrasound guidance was used to access the upper extremity fistula with a micropuncture kit. Exchange was made for a 6 [place] short sheath.  A combination of a glide catheter, multipurpose angled catheter, Bentson wire and Glidewire was used to navigate centrally through several tandem stenoses.  Venogram of the left upper extremity demonstrates patency of the brachycephalic vein  Upon withdrawal of the angled catheter, a solution of 2 milligrams of tPA in saline was infused into the outflow portion of the graft.  Multiple balloon angioplasty was performed including at the cephalic arch, and 3 stenoses of the venous outflow, including region of occlusion just beyond the aneurysm of the left upper extremity.  Cephalic arch was angioplastied to a diameter of 14 mm.  Repeat fistulogram was  then performed.  Catheters and wires were removed with a complete fistulagram performed through the central vasculature.  No residual stenosis was identified in the venous outflow. No residual stenosis at the arterial anastomosis.  The short sheaths were removed after placement of hemostasis sutures.  Patient tolerated the procedure well and remained hemodynamically stable throughout.  No complications were encountered and no significant blood loss was encountered.  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 Near complete occlusions at several regions of the outflow were angioplasty to as great is 14 mm.  At the completion of the study there was a thrill palpated at the fistula, with no residual stenosis of the outflow. There was mild residual thrombus within the aneurysmal segment of the outflow.  Arterial anastomosis widely patent.  IMPRESSION: Status post mechanical and pharmacologic thrombectomy/ thrombolysis of left upper extremity brachycephalic fistula, with balloon angioplasty of multiple tandem stenosis of the outflow.  Signed,  Dulcy Fanny. Earleen Newport, DO  [facility]  [facility]  Radiology  ACCESS: Should this access re- thrombosis, it may be difficult to navigate through the aneurysm. Consideration of surgical revision may be considered.   Electronically Signed   By: Corrie Mckusick D.O.   On: 01/22/2015 08:24   Dg Chest Port 1 View  01/22/2015   CLINICAL DATA:  Respiratory failure.  EXAM: PORTABLE CHEST - 1 VIEW  COMPARISON:  01/21/2015.  FINDINGS: Endotracheal tube and NG tube in stable position. Prior CABG. Heart size normal. Pulmonary vascularity normal. Bibasilar atelectasis and/or infiltrates. Small pleural effusions. No pneumothorax.  IMPRESSION: 1. Lines and tubes in stable position. 2. Bibasilar atelectasis and/or infiltrates with small bilateral pleural effusions. 3. Prior CABG.  Heart size normal.  No pulmonary  venous congestion.   Electronically Signed   By: Marcello Moores  Register   On: 01/22/2015 07:06   Dg Chest Port 1 View  01/21/2015   CLINICAL DATA:  ET tube placement  EXAM: PORTABLE CHEST - 1 VIEW  COMPARISON:  12/22/2014  FINDINGS: Endotracheal tube with the tip 4 cm above the carina. Nasogastric tube coursing below the diaphragm.  Bilateral mild interstitial thickening. Small bilateral pleural effusions. No focal consolidation or pneumothorax. Normal heart size. Prior CABG.  IMPRESSION: Endotracheal tube with the tip 4 cm above the carina.  Nasogastric tube coursing below the diaphragm.   Electronically Signed   By: Kathreen Devoid   On: 01/21/2015 21:23   Ir Declot Left Mod Sed  01/22/2015   CLINICAL DATA:  74 year old female with a history of longstanding renal failure. 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 She has been referred for evaluation and treatment.  Brachycephalic fistula was placed 5 years previously. She does not recall many prior treatments.  EXAM: DIALYSIS GRAFT DECLOT  VENOUS ANGIOPLASTY  ULTRASOUND GUIDANCE FOR VASCULAR ACCESS  MEDICATIONS: 3.5 mg Versed, 162 mcg fentanyl  40 mg IV labetalol  3000 units heparin  4 mg tPA  FLUOROSCOPY TIME:  37 minutes  TECHNIQUE: The procedure, risks, benefits, and alternatives were explained to the patient and the patient's family, including bleeding, infection, arterial thrombus, venous thrombus, vessel injury, need for further procedure, need for stenting, contrast reaction, need for catheter placement, cardiopulmonary collapse, death. Questions regarding the procedure were encouraged and answered. The patient understands and consents to the procedure.  Ultrasound survey was performed with images stored and sent to PACs.  The left upper extremity was prepped and draped in the usual sterile fashion.  Ultrasound guidance was used to access the upper extremity fistula with a micropuncture kit. Exchange was made for a 6 [place]  short sheath.  A combination of a glide catheter, multipurpose angled catheter, Bentson wire and Glidewire was used to navigate centrally through several tandem stenoses.  Venogram of the left upper extremity demonstrates patency of the brachycephalic vein  Upon withdrawal of the angled catheter, a solution of 2 milligrams of tPA in saline was infused into the outflow portion of the graft.  Multiple balloon angioplasty was performed including at the cephalic arch, and 3 stenoses of the venous outflow, including region of occlusion just beyond the aneurysm of the left upper extremity.  Cephalic arch was angioplastied to a diameter of 14 mm.  Repeat fistulogram was then performed.  Catheters and wires were removed with a complete fistulagram performed through the central vasculature.  No residual stenosis was identified in the venous outflow. No residual stenosis at the arterial anastomosis.  The short sheaths were removed after placement of hemostasis sutures.  Patient  tolerated the procedure well and remained hemodynamically stable throughout.  No complications were encountered and no significant blood loss was encountered.  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 Near complete occlusions at several regions of the outflow were angioplasty to as great is 14 mm.  At the completion of the study there was a thrill palpated at the fistula, with no residual stenosis of the outflow. There was mild residual thrombus within the aneurysmal segment of the outflow.  Arterial anastomosis widely patent.  IMPRESSION: Status post mechanical and pharmacologic thrombectomy/ thrombolysis of left upper extremity brachycephalic fistula, with balloon angioplasty of multiple tandem stenosis of the outflow.  Signed,  Dulcy Fanny. Earleen Newport, DO  [facility]  [facility] Radiology  ACCESS: Should this access re- thrombosis, it may be difficult to navigate  through the aneurysm. Consideration of surgical revision may be considered.   Electronically Signed   By: Corrie Mckusick D.O.   On: 01/22/2015 08:24    Labs:  CBC:  Recent Labs  01/21/15 2300 01/22/15 0351 01/23/15 0236 01/24/15 0237  WBC 10.8* 9.0 10.2 10.0  HGB 8.5* 7.9* 8.6* 7.4*  HCT 26.6* 24.8* 28.0* 24.0*  PLT 216 198 205 207    COAGS: No results for input(s): INR, APTT in the last 8760 hours.  BMP:  Recent Labs  01/22/15 0351 01/22/15 1400 01/23/15 0236 01/24/15 0237  NA 135 140 137 137  K 6.3* 4.0 4.3 4.7  CL 100* 104 99* 101  CO2 22 26 23  19*  GLUCOSE 104* 98 89 80  BUN 53* 18 27* 44*  CALCIUM 8.9 9.0 9.1 8.9  CREATININE 11.69* 5.72* 7.83* 10.60*  GFRNONAA 3* 7* 4* 3*  GFRAA 3* 8* 5* 4*    LIVER FUNCTION TESTS:  Recent Labs  10/01/14 0300 10/02/14 0613 10/16/14 2232 12/13/14 0900 01/22/15 1400 01/23/15 0236 01/24/15 0237  BILITOT 0.9 0.8 0.5 1.0  --   --   --   AST 33 30 14 19   --   --   --   ALT 27 25 14  13*  --   --   --   ALKPHOS 76 77 107 83  --   --   --   PROT 6.3 6.1 6.8 6.0*  --   --   --   ALBUMIN 3.5 3.3* 3.8 3.6 3.1* 3.1* 2.8*    Assessment and Plan:  Will follow Plan per CCM  Signed: Roey Coopman A 01/24/2015, 11:10 AM   I spent a total of 15 Minutes in face to face in clinical consultation/evaluation, greater than 50% of which was counseling/coordinating care for hypertensive event in IR

## 2015-01-24 NOTE — Procedures (Signed)
Extubation Procedure Note  Patient Details:   Name: Shawna [name] DOB: 1940/08/06 MRN: [id number]   Airway Documentation:     Evaluation  O2 sats: [id number] Complications: none Patient tolerated procedure well. Bilateral Breath Sounds: Clear Suctioning: Oral Pt able to speak  Per CCM order, pt extubated and placed on nasal cannula.  Tolerated well.  Martha Clan 01/24/2015, 2:38 PM

## 2015-01-24 NOTE — Progress Notes (Signed)
PULMONARY / CRITICAL CARE MEDICINE   Name: Shawna [name] MRN: [id number] DOB: [birth year]    ADMISSION DATE:  01/21/2015 CONSULTATION DATE:  01/21/2015  REFERRING MD :  Earleen Newport IR  CHIEF COMPLAINT:    INITIAL PRESENTATION: 74 year old female, ESRD on HD MWF. To IR 7/13 for angioplasty of fistula. She was profoundly hypertensive throughout the procedure, and eventually became unresponsive, requiring intubation and admission to ICU.   SIGNIFICANT EVENTS: 7/13: To IR for angioplasty of fistula and found to be hypertensive. Became unresponsive and required intubation. Admitted to ICU.  SUBJECTIVE: Wakes easily. No acute issues overnight. On HD. Will attempt trials for hopeful extubation later today.  VITAL SIGNS: Temp:  [98.7 F (37.1 C)-100 F (37.8 C)] 99 F (37.2 C) (07/16 0812) Pulse Rate:  [68-92] 86 (07/16 0854) Resp:  [13-19] 15 (07/16 0854) BP: (117-194)/(39-59) 144/40 mmHg (07/16 0854) SpO2:  [100 %] 100 % (07/16 0854) FiO2 (%):  [40 %] 40 % (07/16 0846) Weight:  [146 lb 6.2 oz (66.4 kg)-155 lb 3.3 oz (70.4 kg)] 146 lb 6.2 oz (66.4 kg) (07/16 0715) HEMODYNAMICS:   VENTILATOR SETTINGS: Vent Mode:  [-] PRVC FiO2 (%):  [40 %] 40 % Set Rate:  [15 bmp] 15 bmp Vt Set:  [400 mL] 400 mL PEEP:  [5 cmH20] 5 cmH20 Plateau Pressure:  [12 cmH20-16 cmH20] 14 cmH20 INTAKE / OUTPUT:  Intake/Output Summary (Last 24 hours) at 01/24/15 0928 Last data filed at 01/24/15 0854  Gross per 24 hour  Intake  358.8 ml  Output    158 ml  Net  200.8 ml    PHYSICAL EXAMINATION: General: NAD, wakes to vocal stimuli Neuro: no focal deficits appreciated, RASS 0 HEENT: MMM, PERRLA, ETT in place Cardiovascular: RRR, no murmurs Lungs: Course breath sounds at bases bilaterally Abdomen:  Soft and nondistended, +BS Musculoskeletal: No edema noted, peripheral pulses palpable throughout Skin: No rashes noted  LABS:  CBC  Recent Labs Lab 01/22/15 0351 01/23/15 0236 01/24/15 0237  WBC 9.0  10.2 10.0  HGB 7.9* 8.6* 7.4*  HCT 24.8* 28.0* 24.0*  PLT 198 205 207   Coag's No results for input(s): APTT, INR in the last 168 hours. BMET  Recent Labs Lab 01/22/15 1400 01/23/15 0236 01/24/15 0237  NA 140 137 137  K 4.0 4.3 4.7  CL 104 99* 101  CO2 26 23 19*  BUN 18 27* 44*  CREATININE 5.72* 7.83* 10.60*  GLUCOSE 98 89 80   Electrolytes  Recent Labs Lab 01/21/15 2030 01/22/15 0351 01/22/15 1400 01/23/15 0236 01/24/15 0237  CALCIUM 8.8* 8.9 9.0 9.1 8.9  MG 1.6* 1.6* 1.7  --   --   PHOS  --   --  3.7 5.3* 7.5*   Sepsis Markers  Recent Labs Lab 01/21/15 2030  LATICACIDVEN 1.7   ABG  Recent Labs Lab 01/21/15 2256  PHART 7.389  PCO2ART 37.8  PO2ART 100.0   Liver Enzymes  Recent Labs Lab 01/22/15 1400 01/23/15 0236 01/24/15 0237  ALBUMIN 3.1* 3.1* 2.8*   Cardiac Enzymes  Recent Labs Lab 01/21/15 2030  TROPONINI 0.03   Glucose  Recent Labs Lab 01/23/15 0730 01/23/15 1257 01/23/15 1617 01/23/15 1948 01/23/15 2341 01/24/15 0331  GLUCAP 83 90 75 79 77 84    ASSESSMENT / PLAN:  PULMONARY OETT 7/13>> A: Acute Respiratory Failure, Ventilator Dependent P:   Full vent support - PEEP 5, FiO2 40%  VAP bundle SBT as tol Will trial for possible extubation later today  CARDIOVASCULAR CVL>> A:  H/O CAD with NSTEMI in 2013 and stent placement and CABG H/O Chronic Diastolic CHF with Echo in 2015 showing EF 55-60%, moderate LVH, grade 1 H/O Malignant Hypertension Widened pulse pressure - likely 2/2 antiHTN agents (reduced PVR) P:  Clonidine 0.2mg  BID Hydralazine 75mg  TID  - as well as Q4 PRN Labetalol 200mg  BID Cardiac Monitor.   RENAL A:   Malfunctioning AV Fistula H/O ESRD on HD MWF P:   Nephrology consult - on board Vascular to follow HD today KVO IVF  GASTROINTESTINAL A:   H/O GI Bleed, Colon cancer P:   NPO Pepcid Pt's GI doctor informed us of 2-of-3 pos FOBTs recently  - FOBT ordered for next  stool  HEMATOLOGIC A:   Anemia (Baseline ~9-10) P:  Hgb 7.4; monitor w/ CBCs  - likely 2/2 ESRD, but Hx Colon CA is concerning  - FOBT SCD  INFECTIOUS A:   No evidence of infection P:   Monitor clinically  ENDOCRINE A:   No acute issues P:   Monitor glucose on BMPs  NEUROLOGIC A:   Acute encephalopathy H/O TIA P:   Sedation: versed/fentanyl PRN RASS goal: 0  FAMILY  - Updates: Husband at bedside 7/13  - Inter-disciplinary family meet or Palliative Care meeting due by:  01/28/15    Elberta Leatherwood, MD,MS,  PGY2 01/24/2015 9:28 AM

## 2015-01-24 NOTE — Procedures (Signed)
Patient was seen on dialysis and the procedure was supervised. BFR 400 Via Left fem HD cath BP is 130/49.  Patient appears to be tolerating treatment well but conductivitiy is dropping on machine and orders were to use her AVF which was declotted by IR.  Will rinse back and get new machine to complete HD.  Hopeful extubation after HD today.

## 2015-01-24 NOTE — Progress Notes (Signed)
PULMONARY / CRITICAL CARE MEDICINE   Name: Shawna [name] MRN: [id number] DOB: May 30, 1941    ADMISSION DATE:  01/21/2015 CONSULTATION DATE:  01/21/2015  REFERRING MD :  Earleen Newport IR  CHIEF COMPLAINT:    INITIAL PRESENTATION: 74 year old female, ESRD on HD MWF. To IR 7/13 for angioplasty of fistula. She was profoundly hypertensive throughout the procedure, and eventually became unresponsive, requiring intubation and admission to ICU.   SIGNIFICANT EVENTS: 7/13: To IR for angioplasty of fistula and found to be hypertensive. Became unresponsive and required intubation. Admitted to ICU.  SUBJECTIVE: Wakes easily. No acute issues overnight. On HD. Will attempt trials for hopeful extubation later today.  VITAL SIGNS: Temp:  [98.7 F (37.1 C)-100 F (37.8 C)] 99 F (37.2 C) (07/16 1225) Pulse Rate:  [68-92] 84 (07/16 1230) Resp:  [13-18] 14 (07/16 1230) BP: (110-194)/(29-59) 119/42 mmHg (07/16 1230) SpO2:  [100 %] 100 % (07/16 1230) FiO2 (%):  [40 %] 40 % (07/16 1230) Weight:  [131 lb 2.8 oz (59.5 kg)-155 lb 3.3 oz (70.4 kg)] 131 lb 2.8 oz (59.5 kg) (07/16 1230) VENTILATOR SETTINGS: Vent Mode:  [-] PRVC FiO2 (%):  [40 %] 40 % Set Rate:  [15 bmp] 15 bmp Vt Set:  [400 mL] 400 mL PEEP:  [5 cmH20] 5 cmH20 Plateau Pressure:  [12 cmH20-16 cmH20] 13 cmH20 INTAKE / OUTPUT:  Intake/Output Summary (Last 24 hours) at 01/24/15 1321 Last data filed at 01/24/15 1230  Gross per 24 hour  Intake    200 ml  Output   1958 ml  Net  -1758 ml    PHYSICAL EXAMINATION: General: NAD, wakes to vocal stimuli Neuro: no focal deficits appreciated, RASS 0 HEENT: MMM, PERRLA, ETT in place Cardiovascular: RRR, no murmurs Lungs: Course breath sounds at bases bilaterally Abdomen:  Soft and nondistended, +BS Musculoskeletal: No edema noted, peripheral pulses palpable throughout Skin: No rashes noted  LABS:  CBC  Recent Labs Lab 01/22/15 0351 01/23/15 0236 01/24/15 0237  WBC 9.0 10.2 10.0  HGB 7.9*  8.6* 7.4*  HCT 24.8* 28.0* 24.0*  PLT 198 205 207   BMET  Recent Labs Lab 01/22/15 1400 01/23/15 0236 01/24/15 0237  NA 140 137 137  K 4.0 4.3 4.7  CL 104 99* 101  CO2 26 23 19*  BUN 18 27* 44*  CREATININE 5.72* 7.83* 10.60*  GLUCOSE 98 89 80   Electrolytes  Recent Labs Lab 01/21/15 2030 01/22/15 0351 01/22/15 1400 01/23/15 0236 01/24/15 0237  CALCIUM 8.8* 8.9 9.0 9.1 8.9  MG 1.6* 1.6* 1.7  --   --   PHOS  --   --  3.7 5.3* 7.5*   Sepsis Markers  Recent Labs Lab 01/21/15 2030  LATICACIDVEN 1.7   ABG  Recent Labs Lab 01/21/15 2256  PHART 7.389  PCO2ART 37.8  PO2ART 100.0   Liver Enzymes  Recent Labs Lab 01/22/15 1400 01/23/15 0236 01/24/15 0237  ALBUMIN 3.1* 3.1* 2.8*   Cardiac Enzymes  Recent Labs Lab 01/21/15 2030  TROPONINI 0.03   Glucose  Recent Labs Lab 01/23/15 0730 01/23/15 1257 01/23/15 1617 01/23/15 1948 01/23/15 2341 01/24/15 0331  GLUCAP 83 90 75 79 77 84    ASSESSMENT / PLAN:  PULMONARY OETT 7/13>> A: Acute Respiratory Failure, Ventilator Dependent P:   Full vent support - PEEP 5, FiO2 40%  VAP bundle SBT as tol Will trial for possible extubation later today  CARDIOVASCULAR CVL>> A:  H/O CAD with NSTEMI in 2013 and stent placement and  CABG H/O Chronic Diastolic CHF with Echo in 2015 showing EF 55-60%, moderate LVH, grade 1 H/O Malignant Hypertension Widened pulse pressure - likely 2/2 antiHTN agents (reduced PVR) P:  Clonidine 0.2mg  BID Hydralazine 75mg  TID  - as well as Q4 PRN Labetalol 200mg  BID Cardiac Monitor.   RENAL A:   Malfunctioning AV Fistula H/O ESRD on HD MWF P:   Nephrology consult - on board Vascular to follow HD today KVO IVF  GASTROINTESTINAL A:   H/O GI Bleed, Colon cancer P:   NPO Pepcid Pt's GI doctor informed us of 2-of-3 pos FOBTs recently  - FOBT ordered for next stool  HEMATOLOGIC A:   Anemia (Baseline ~9-10) P:  Hgb 7.4; monitor w/ CBCs  - likely 2/2 ESRD,  but Hx Colon CA is concerning  - FOBT SCD  INFECTIOUS A:   No evidence of infection P:   Monitor clinically  ENDOCRINE A:   No acute issues P:   Monitor glucose on BMPs  NEUROLOGIC A:   Acute encephalopathy H/O TIA P:   Sedation: versed/fentanyl PRN RASS goal: 0  FAMILY  - Updates: Husband at bedside 7/13  - Inter-disciplinary family meet or Palliative Care meeting due by:  01/28/15    Elberta Leatherwood, MD,MS,  PGY2 01/24/2015   Reviewed above, examined.  She is alert.  She had trouble with SBT this AM, but doing better now.  She denies chest/abd pain.  Heart rate regular, no wheeze, abd soft, no edema.  Labs shows anemia, elevated creatinine.  Will try on pressure support >> if she does well, then consider extubation.  Continue BP meds.  HD per renal.  Updated family at bedside.  CC time by me independent of Resident time 35 minutes.  Chesley Mires, MD [facility] Pulmonary/Critical Care 01/24/2015, 1:24 PM Pager:  [phone] After 3pm call: [phone]

## 2015-01-25 DIAGNOSIS — G9340 Encephalopathy, unspecified: Secondary | ICD-10-CM

## 2015-01-25 DIAGNOSIS — Z992 Dependence on renal dialysis: Secondary | ICD-10-CM

## 2015-01-25 DIAGNOSIS — N186 End stage renal disease: Secondary | ICD-10-CM

## 2015-01-25 DIAGNOSIS — I10 Essential (primary) hypertension: Secondary | ICD-10-CM

## 2015-01-25 LAB — BASIC METABOLIC PANEL
Anion gap: 12 (ref 5–15)
BUN: 35 mg/dL — ABNORMAL HIGH (ref 6–20)
CO2: 26 mmol/L (ref 22–32)
Calcium: 8.8 mg/dL — ABNORMAL LOW (ref 8.9–10.3)
Chloride: 100 mmol/L — ABNORMAL LOW (ref 101–111)
Creatinine, Ser: 7.45 mg/dL — ABNORMAL HIGH (ref 0.44–1.00)
GFR calc non Af Amer: 5 mL/min — ABNORMAL LOW (ref >60)
GFR, EST AFRICAN AMERICAN: 6 mL/min — AB (ref >60)
Glucose, Bld: 99 mg/dL (ref 65–99)
Potassium: 4.6 mmol/L (ref 3.5–5.1)
Sodium: 138 mmol/L (ref 135–145)

## 2015-01-25 LAB — CBC
HEMATOCRIT: 25.6 % — AB (ref 36.0–46.0)
Hemoglobin: 7.9 g/dL — ABNORMAL LOW (ref 12.0–15.0)
MCH: 30.6 pg (ref 26.0–34.0)
MCHC: 30.9 g/dL (ref 30.0–36.0)
MCV: 99.2 fL (ref 78.0–100.0)
Platelets: 220 10*3/uL (ref 150–400)
RBC: 2.58 MIL/uL — AB (ref 3.87–5.11)
RDW: 16.5 % — AB (ref 11.5–15.5)
WBC: 8.7 10*3/uL (ref 4.0–10.5)

## 2015-01-25 MED ORDER — SIMVASTATIN 20 MG PO TABS
20.0000 mg | ORAL_TABLET | Freq: Every day | ORAL | Status: DC
  Administered 2015-01-25 – 2015-01-26 (×2): 20 mg via ORAL
  Filled 2015-01-25 (×3): qty 1

## 2015-01-25 MED ORDER — LORATADINE 10 MG PO TABS
10.0000 mg | ORAL_TABLET | Freq: Once | ORAL | Status: AC
  Administered 2015-01-25: 10 mg via ORAL
  Filled 2015-01-25: qty 1

## 2015-01-25 MED ORDER — LABETALOL HCL 200 MG PO TABS
200.0000 mg | ORAL_TABLET | Freq: Two times a day (BID) | ORAL | Status: DC
  Administered 2015-01-26 (×2): 200 mg via ORAL
  Filled 2015-01-25 (×5): qty 1

## 2015-01-25 MED ORDER — FAMOTIDINE 40 MG/5ML PO SUSR
20.0000 mg | Freq: Every day | ORAL | Status: DC
  Administered 2015-01-26 – 2015-01-27 (×2): 20 mg via ORAL
  Filled 2015-01-25 (×2): qty 2.5

## 2015-01-25 MED ORDER — CLONIDINE HCL 0.3 MG PO TABS
0.3000 mg | ORAL_TABLET | Freq: Two times a day (BID) | ORAL | Status: DC
  Administered 2015-01-25 – 2015-01-26 (×2): 0.3 mg via ORAL
  Filled 2015-01-25 (×5): qty 1

## 2015-01-25 MED ORDER — ISOSORBIDE DINITRATE 20 MG PO TABS
20.0000 mg | ORAL_TABLET | Freq: Three times a day (TID) | ORAL | Status: DC
  Administered 2015-01-25 – 2015-01-27 (×4): 20 mg via ORAL
  Filled 2015-01-25 (×8): qty 1

## 2015-01-25 MED ORDER — HYDRALAZINE HCL 50 MG PO TABS
75.0000 mg | ORAL_TABLET | Freq: Three times a day (TID) | ORAL | Status: DC
  Administered 2015-01-25 – 2015-01-26 (×3): 75 mg via ORAL
  Filled 2015-01-25 (×6): qty 1

## 2015-01-25 NOTE — Progress Notes (Addendum)
PULMONARY / CRITICAL CARE MEDICINE   Name: Shawna [name] MRN: [id number] DOB: 1941/03/03    ADMISSION DATE:  01/21/2015 CONSULTATION DATE:  01/21/2015  REFERRING MD :  Earleen Newport IR  CHIEF COMPLAINT:    INITIAL PRESENTATION: 74 year old female, ESRD on HD MWF. To IR 7/13 for angioplasty of fistula. She was profoundly hypertensive throughout the procedure, and eventually became unresponsive, requiring intubation and admission to ICU.   SIGNIFICANT EVENTS: 7/13: To IR for angioplasty of fistula and found to be hypertensive. Became unresponsive and required intubation. Admitted to ICU.  SUBJECTIVE: Feels better.  Doesn't remember what happened.  Denies HA, chest/abd pain.  VITAL SIGNS: Temp:  [98.6 F (37 C)-99.5 F (37.5 C)] 99.1 F (37.3 C) (07/17 0836) Pulse Rate:  [72-117] 73 (07/17 0700) Resp:  [14-28] 17 (07/17 0700) BP: (110-209)/(29-133) 133/37 mmHg (07/17 0600) SpO2:  [99 %-100 %] 100 % (07/17 0700) FiO2 (%):  [40 %] 40 % (07/16 1230) Weight:  [131 lb 2.8 oz (59.5 kg)-132 lb 15 oz (60.3 kg)] 132 lb 15 oz (60.3 kg) (07/17 0500) VENTILATOR SETTINGS: Vent Mode:  [-] PRVC FiO2 (%):  [40 %] 40 % Set Rate:  [15 bmp] 15 bmp Vt Set:  [400 mL] 400 mL PEEP:  [5 cmH20] 5 cmH20 Plateau Pressure:  [13 cmH20] 13 cmH20 INTAKE / OUTPUT:  Intake/Output Summary (Last 24 hours) at 01/25/15 1055 Last data filed at 01/25/15 0700  Gross per 24 hour  Intake    180 ml  Output   1800 ml  Net  -1620 ml    PHYSICAL EXAMINATION: General: pleasant Neuro: normal strength HEENT: no sinus tenderness Cardiovascular: regular Lungs: no wheeze Abdomen:  Soft, non tender Musculoskeletal: no edema, AV graft Lt upper arm Skin: No rashes  LABS:  CBC  Recent Labs Lab 01/23/15 0236 01/24/15 0237 01/25/15 0244  WBC 10.2 10.0 8.7  HGB 8.6* 7.4* 7.9*  HCT 28.0* 24.0* 25.6*  PLT 205 207 220   BMET  Recent Labs Lab 01/24/15 0237 01/24/15 1345 01/25/15 0244  NA 137 142 138  K 4.7 3.9  4.6  CL 101 102 100*  CO2 19* 22 26  BUN 44* 19 35*  CREATININE 10.60* 5.47* 7.45*  GLUCOSE 80 87 99   Electrolytes  Recent Labs Lab 01/21/15 2030 01/22/15 0351  01/22/15 1400 01/23/15 0236 01/24/15 0237 01/24/15 1345 01/25/15 0244  CALCIUM 8.8* 8.9  --  9.0 9.1 8.9 9.3 8.8*  MG 1.6* 1.6*  --  1.7  --   --   --   --   PHOS  --   --   < > 3.7 5.3* 7.5* 5.1*  --   < > = values in this interval not displayed.   Sepsis Markers  Recent Labs Lab 01/21/15 2030  LATICACIDVEN 1.7   ABG  Recent Labs Lab 01/21/15 2256  PHART 7.389  PCO2ART 37.8  PO2ART 100.0   Liver Enzymes  Recent Labs Lab 01/23/15 0236 01/24/15 0237 01/24/15 1345  ALBUMIN 3.1* 2.8* 3.3*   Cardiac Enzymes  Recent Labs Lab 01/21/15 2030  TROPONINI 0.03   Glucose  Recent Labs Lab 01/23/15 0730 01/23/15 1257 01/23/15 1617 01/23/15 1948 01/23/15 2341 01/24/15 0331  GLUCAP 83 90 75 79 77 84    ASSESSMENT / PLAN:  ETT 7/13>>7/16  Compromised airway from acute encephalopathy >> resolved.  HTN emergency. Hx of CAD, diastolic CHF. Plan: - continue catapres, hydralazine, labetalol, isordil  ESRD with malfunctioning AV graft. Plan: - per  renal  Anemia of chronic disease. Plan: - f/u CBC  Transfer to telemetry 7/17 >> to [facility] 7/18 and PCCM off.   Chesley Mires, MD [facility] Pulmonary/Critical Care 01/25/2015, 10:55 AM Pager:  [phone] After 3pm call: [phone]

## 2015-01-25 NOTE — Progress Notes (Signed)
Report given to Charge nurse on [place] room 12

## 2015-01-25 NOTE — Progress Notes (Signed)
Patient ID: Shawna [name], female   DOB: Aug 01, 1940, 74 y.o.   MRN: [id number]  [facility] Progress Note    Subjective:   Extubated and feels better   Objective:   BP 133/37 mmHg  Pulse 73  Temp(Src) 99.1 F (37.3 C) (Oral)  Resp 17  Ht 5\' 2"  (1.575 m)  Wt 60.3 kg (132 lb 15 oz)  BMI 24.31 kg/m2  SpO2 100%  Intake/Output: I/O last 3 completed shifts: In: 330 [I.V.:330] Out: 1958 [Other:1958]   Intake/Output this shift:    Weight change: -4 kg (-8 lb 13.1 oz)  Physical Exam: Gen:WD WN elderly AAF in NAD CVS:no rub Resp:occ rhonchi [id number] Ext:no edema, LUE AVF +T/B  Labs: BMET  Recent Labs Lab 01/21/15 2030 01/22/15 0351 01/22/15 1400 01/23/15 0236 01/24/15 0237 01/24/15 1345 01/25/15 0244  NA 135 135 140 137 137 142 138  K 5.6* 6.3* 4.0 4.3 4.7 3.9 4.6  CL 98* 100* 104 99* 101 102 100*  CO2 25 22 26 23  19* 22 26  GLUCOSE 101* 104* 98 89 80 87 99  BUN 54* 53* 18 27* 44* 19 35*  CREATININE 12.16* 11.69* 5.72* 7.83* 10.60* 5.47* 7.45*  ALBUMIN  --   --  3.1* 3.1* 2.8* 3.3*  --   CALCIUM 8.8* 8.9 9.0 9.1 8.9 9.3 8.8*  PHOS  --   --  3.7 5.3* 7.5* 5.1*  --    CBC  Recent Labs Lab 01/22/15 0351 01/23/15 0236 01/24/15 0237 01/25/15 0244  WBC 9.0 10.2 10.0 8.7  HGB 7.9* 8.6* 7.4* 7.9*  HCT 24.8* 28.0* 24.0* 25.6*  MCV 95.0 97.9 98.0 99.2  PLT 198 205 207 220    @IMGRELPRIORS @ Medications:    . alteplase  4 mg Intracatheter Once  . antiseptic oral rinse  7 mL Mouth Rinse QID  . chlorhexidine  15 mL Mouth Rinse BID  . cloNIDine  0.3 mg Per Tube BID  . famotidine  20 mg Per Tube Daily  . hydrALAZINE  75 mg Per Tube 3 times per day  . labetalol  200 mg Per Tube BID  . nitroGLYCERIN  1 inch Topical 4 times per day   Dialysis: [facility]: MWF, 2K/2.5Ca, BFR300 DFR 600, duration 3 hours, EDW 63.5kg, 2000 heparin bolus and 200/hr infusion  Assessment/ Plan:  1.  Respiratory Arrest- following declot/sedation/beta blocker.  S/p intubation and extubated 01/24/15. 2. ESRD- currently on HD (schedule of MWF), will plan on HD tomorrow and get back on schedule 3. Anemia:drop in Hgb. Will contact outpt HD for esa dose and transfuse prn 4. CKD-MBD: Ca stable, will need to check phos 5. Nutrition:per primary 6. Hypertension: starting to respond to HD and UF, hold off on ntg gtt until after HD. 7. Vascular access- s/p PTA to multiple outflow stenoses, +T/B and able to cannulate/flush but not withdraw. 1. Unable to use AVF on 2 attempts. 2. Will try once more with HD tomorrow and if unsuccessful will use temp HD cath but will need PC placement tomorrow before she can be discharged home  3. Will likely need to follow up with vascular surgery for new access.         Royal Beirne A 01/25/2015, 9:00 AM

## 2015-01-26 DIAGNOSIS — J9601 Acute respiratory failure with hypoxia: Secondary | ICD-10-CM

## 2015-01-26 LAB — CBC
HCT: 24 % — ABNORMAL LOW (ref 36.0–46.0)
Hemoglobin: 7.6 g/dL — ABNORMAL LOW (ref 12.0–15.0)
MCH: 30 pg (ref 26.0–34.0)
MCHC: 31.7 g/dL (ref 30.0–36.0)
MCV: 94.9 fL (ref 78.0–100.0)
Platelets: 279 10*3/uL (ref 150–400)
RBC: 2.53 MIL/uL — AB (ref 3.87–5.11)
RDW: 16 % — ABNORMAL HIGH (ref 11.5–15.5)
WBC: 7.5 10*3/uL (ref 4.0–10.5)

## 2015-01-26 LAB — RENAL FUNCTION PANEL
Albumin: 2.9 g/dL — ABNORMAL LOW (ref 3.5–5.0)
Anion gap: 16 — ABNORMAL HIGH (ref 5–15)
BUN: 63 mg/dL — ABNORMAL HIGH (ref 6–20)
CALCIUM: 9 mg/dL (ref 8.9–10.3)
CO2: 21 mmol/L — AB (ref 22–32)
Chloride: 98 mmol/L — ABNORMAL LOW (ref 101–111)
Creatinine, Ser: 10.73 mg/dL — ABNORMAL HIGH (ref 0.44–1.00)
GFR calc Af Amer: 4 mL/min — ABNORMAL LOW (ref >60)
GFR calc non Af Amer: 3 mL/min — ABNORMAL LOW (ref >60)
Glucose, Bld: 200 mg/dL — ABNORMAL HIGH (ref 65–99)
PHOSPHORUS: 7.6 mg/dL — AB (ref 2.5–4.6)
POTASSIUM: 4.1 mmol/L (ref 3.5–5.1)
Sodium: 135 mmol/L (ref 135–145)

## 2015-01-26 MED ORDER — DARBEPOETIN ALFA 200 MCG/0.4ML IJ SOSY
200.0000 ug | PREFILLED_SYRINGE | INTRAMUSCULAR | Status: DC
  Filled 2015-01-26: qty 0.4

## 2015-01-26 MED ORDER — HEPARIN SODIUM (PORCINE) 1000 UNIT/ML DIALYSIS: 20.0000 [IU]/kg | INTRAMUSCULAR | Status: DC | PRN

## 2015-01-26 MED ORDER — PHENOL 1.4 % MT LIQD
1.0000 | OROMUCOSAL | Status: DC | PRN
  Administered 2015-01-26: 1 via OROMUCOSAL
  Filled 2015-01-26: qty 177

## 2015-01-26 MED ORDER — BISACODYL 5 MG PO TBEC
10.0000 mg | DELAYED_RELEASE_TABLET | Freq: Once | ORAL | Status: AC
  Administered 2015-01-26: 10 mg via ORAL
  Filled 2015-01-26: qty 2

## 2015-01-26 NOTE — Progress Notes (Signed)
Subjective: Interval History: has complaints ST where had tube, feels closing.  Objective: Vital signs in last 24 hours: Temp:  [98.3 F (36.8 C)-98.9 F (37.2 C)] 98.6 F (37 C) (07/18 0906) Pulse Rate:  [75-82] 77 (07/18 0906) Resp:  [20-25] 20 (07/18 0906) BP: (111-192)/(39-83) 160/50 mmHg (07/18 0906) SpO2:  [97 %-100 %] 98 % (07/18 0906) Weight:  [60.3 kg (132 lb 15 oz)] 60.3 kg (132 lb 15 oz) (07/18 0414) Weight change: -6.1 kg (-13 lb 7.2 oz)  Intake/Output from previous day: 07/17 0701 - 07/18 0700 In: 840 [P.O.:620; I.V.:220] Out: -  Intake/Output this shift:    General appearance: alert, cooperative and no distress Resp: rales bibasilar Cardio: S1, S2 normal and systolic murmur: holosystolic 2/6, blowing at apex GI: pos bs, liver down 4 cm, soft Extremities: Temp Lgroin cath, LUA AVF B&T  Lab Results:  Recent Labs  01/24/15 0237 01/25/15 0244  WBC 10.0 8.7  HGB 7.4* 7.9*  HCT 24.0* 25.6*  PLT 207 220   BMET:  Recent Labs  01/24/15 1345 01/25/15 0244  NA 142 138  K 3.9 4.6  CL 102 100*  CO2 22 26  GLUCOSE 87 99  BUN 19 35*  CREATININE 5.47* 7.45*  CALCIUM 9.3 8.8*   No results for input(s): PTH in the last 72 hours. Iron Studies: No results for input(s): IRON, TIBC, TRANSFERRIN, FERRITIN in the last 72 hours.  Studies/Results: No results found.  I have reviewed the patient's current medications.  Assessment/Plan: 1 ESRD for HD . Use AVF 2 Access try aVF today 3 xs sedation resolved 4 Anemia give epo 5 HPTH will follow 6 CAD P HD, Aranesp, use AVF    LOS: 5 days   Marylan Glore L 01/26/2015,9:10 AM

## 2015-01-26 NOTE — Procedures (Signed)
I was present at this session.  I have reviewed the session itself and made appropriate changes.  To try to use AVF, awaiting stick.  Zay Yeargan L 7/18/20169:42 AM

## 2015-01-26 NOTE — Care Management Important Message (Signed)
Important Message  Patient Details  Name: Shawna [name] MRN: [id number] Date of Birth: [birth year]   Medicare Important Message Given:  Yes-second notification given    [name] 01/26/2015, 1:58 PM

## 2015-01-26 NOTE — Progress Notes (Signed)
Patient ID: Shawna [name], female   DOB: [birth year], 74 y.o.   MRN: [id number]  [facility] PROGRESS NOTE  Shawna [name] [id number] DOB: 02/25/1941 DOA: 01/21/2015 PCP: [name], MD   Brief narrative:    74 year old female, ESRD on HD MWF. To IR 7/13 for angioplasty of fistula. She was profoundly hypertensive throughout the procedure, and eventually became unresponsive, requiring intubation and admission to ICU.   SIGNIFICANT EVENTS: 7/13: To IR for angio of fistula, found to be hypertensive. Became unresponsive and required intubation. Admitted to ICU. 7/18; [facility] assumed care   Assessment/Plan:    Acute hypoxic respiratory failure in the setting of acute encephalopathy  - ETT 7/13 --> 7/16 - now clear mental status - respiratory status stable  - repeat CXR in AM  HTN-ive emergency - SBP in 170's - continue catapres 0.3 mg BID, labetalol 200 mg BID, isordil 20 mg TID - add hydralazine   ESRD with malfunctioning AV graft. - management per nephrology team   Anemia of chronic disease, ESRD  - monitor CBC  - EPO per nephrology team   DVT prophylaxis - SCD's  Code Status: Full.  Family Communication:  plan of care discussed with the patient Disposition Plan: Home when stable.   IV access:  LUA AVF  Procedures and diagnostic studies:    US Abdomen Complete 01/06/2015   Echogenic liver parenchyma may indicate fatty infiltration. No focal hepatic abnormality is seen. 2. Echogenic renal parenchyma and the kidneys are small, consistent with chronic renal medical disease. Small cysts are present but no hydronephrosis is seen. 3. No gallstones  Ir US Guide Vasc Access Left 01/22/2015  Status post mechanical and pharmacologic thrombectomy/ thrombolysis of left upper extremity brachycephalic fistula, with balloon angioplasty of multiple tandem stenosis of the outflow.  Signed,  Dulcy Fanny. Earleen Newport, DO  [facility]  [facility]   ACCESS: Should this access re- thrombosis, it may be difficult to navigate through the aneurysm. Consideration of surgical revision may be considered.    Dg Chest Port 1 View 01/21/2015  Endotracheal tube with the tip 4 cm above the carina.  Nasogastric tube coursing below the diaphragm.     Ir Declot Left Mod Sed 01/22/2015   Should this access re- thrombosis, it may be difficult to navigate through the aneurysm. Consideration of surgical revision may be considered.     Medical Consultants:  Nephrology   Other Consultants:  None  IAnti-Infectives:   None  Faye Ramsay, MD  [facility] Pager [phone]  If 7PM-7AM, please contact night-coverage [email or web address] Password [facility] 01/26/2015, 10:11 PM   LOS: 5 days   HPI/Subjective: No events overnight.   Objective: Filed Vitals:   01/26/15 1258 01/26/15 1401 01/26/15 1746 01/26/15 2054  BP: 150/67 141/47 155/49 178/57  Pulse: 83 85 80 87  Temp: 98.2 F (36.8 C) 98.2 F (36.8 C) 98.8 F (37.1 C) 99.3 F (37.4 C)  TempSrc: Oral Oral Oral Oral  Resp: 21 22 17 18   Height:      Weight: 58.9 kg (129 lb 13.6 oz)     SpO2: 98% 97% 97% 99%    Intake/Output Summary (Last 24 hours) at 01/26/15 2211 Last data filed at 01/26/15 1824  Gross per 24 hour  Intake    400 ml  Output   1981 ml  Net  -1581 ml    Exam:   General:  Pt is alert, follows commands appropriately, not in acute distress  Cardiovascular: Regular  rate and rhythm, S1/S2, SEM 2/6, no rubs, no gallops  Respiratory: Clear to auscultation bilaterally, no wheezing, no crackles, no rhonchi  Abdomen: Soft, non tender, non distended, bowel sounds present, no guarding  Extremities: No edema, pulses DP and PT palpable bilaterally  Neuro: Grossly nonfocal  Data Reviewed: Basic Metabolic Panel:  Recent Labs Lab 01/21/15 2030 01/22/15 0351 01/22/15 1400 01/23/15 0236 01/24/15 0237 01/24/15 1345 01/25/15 0244 01/26/15 0500  NA 135 135 140 137 137 142 138 135   K 5.6* 6.3* 4.0 4.3 4.7 3.9 4.6 4.1  CL 98* 100* 104 99* 101 102 100* 98*  CO2 25 22 26 23  19* 22 26 21*  GLUCOSE 101* 104* 98 89 80 87 99 200*  BUN 54* 53* 18 27* 44* 19 35* 63*  CREATININE 12.16* 11.69* 5.72* 7.83* 10.60* 5.47* 7.45* 10.73*  CALCIUM 8.8* 8.9 9.0 9.1 8.9 9.3 8.8* 9.0  MG 1.6* 1.6* 1.7  --   --   --   --   --   PHOS  --   --  3.7 5.3* 7.5* 5.1*  --  7.6*   Liver Function Tests:  Recent Labs Lab 01/22/15 1400 01/23/15 0236 01/24/15 0237 01/24/15 1345 01/26/15 0500  ALBUMIN 3.1* 3.1* 2.8* 3.3* 2.9*   CBC:  Recent Labs Lab 01/22/15 0351 01/23/15 0236 01/24/15 0237 01/25/15 0244 01/26/15 0500  WBC 9.0 10.2 10.0 8.7 7.5  HGB 7.9* 8.6* 7.4* 7.9* 7.6*  HCT 24.8* 28.0* 24.0* 25.6* 24.0*  MCV 95.0 97.9 98.0 99.2 94.9  PLT 198 205 207 220 279   Cardiac Enzymes:  Recent Labs Lab 01/21/15 2030  TROPONINI 0.03   CBG:  Recent Labs Lab 01/23/15 1257 01/23/15 1617 01/23/15 1948 01/23/15 2341 01/24/15 0331  GLUCAP 90 75 79 77 84    Recent Results (from the past 240 hour(s))  MRSA PCR Screening     Status: None   Collection Time: 01/21/15 10:34 PM  Result Value Ref Range Status   MRSA by PCR NEGATIVE NEGATIVE Final    Comment:        The GeneXpert MRSA Assay (FDA approved for NASAL specimens only), is one component of a comprehensive MRSA colonization surveillance program. It is not intended to diagnose MRSA infection nor to guide or monitor treatment for MRSA infections.      Scheduled Meds: . cloNIDine  0.3 mg Oral BID  . darbepoetin DIALYSIS  200 mcg Intravenous Q Mon-HD  . famotidine  20 mg Oral Daily  . isosorbide dinitrate  20 mg Oral TID  . labetalol  200 mg Oral BID  . simvastatin  20 mg Oral QHS   Continuous Infusions: . sodium chloride 10 mL/hr at 01/23/15 0500

## 2015-01-27 ENCOUNTER — Inpatient Hospital Stay (HOSPITAL_COMMUNITY): Payer: Medicare Other

## 2015-01-27 ENCOUNTER — Telehealth (INDEPENDENT_AMBULATORY_CARE_PROVIDER_SITE_OTHER): Payer: Self-pay | Admitting: *Deleted

## 2015-01-27 LAB — RENAL FUNCTION PANEL
Albumin: 3 g/dL — ABNORMAL LOW (ref 3.5–5.0)
Anion gap: 14 (ref 5–15)
BUN: 36 mg/dL — AB (ref 6–20)
CO2: 27 mmol/L (ref 22–32)
Calcium: 9.3 mg/dL (ref 8.9–10.3)
Chloride: 97 mmol/L — ABNORMAL LOW (ref 101–111)
Creatinine, Ser: 7.1 mg/dL — ABNORMAL HIGH (ref 0.44–1.00)
GFR calc Af Amer: 6 mL/min — ABNORMAL LOW (ref >60)
GFR calc non Af Amer: 5 mL/min — ABNORMAL LOW (ref >60)
Glucose, Bld: 99 mg/dL (ref 65–99)
PHOSPHORUS: 6 mg/dL — AB (ref 2.5–4.6)
Potassium: 4.1 mmol/L (ref 3.5–5.1)
Sodium: 138 mmol/L (ref 135–145)

## 2015-01-27 LAB — CBC
HCT: 26.5 % — ABNORMAL LOW (ref 36.0–46.0)
HEMOGLOBIN: 8.3 g/dL — AB (ref 12.0–15.0)
MCH: 30.1 pg (ref 26.0–34.0)
MCHC: 31.3 g/dL (ref 30.0–36.0)
MCV: 96 fL (ref 78.0–100.0)
Platelets: 282 10*3/uL (ref 150–400)
RBC: 2.76 MIL/uL — AB (ref 3.87–5.11)
RDW: 15.8 % — ABNORMAL HIGH (ref 11.5–15.5)
WBC: 6.6 10*3/uL (ref 4.0–10.5)

## 2015-01-27 IMAGING — DX DG CHEST 2V
2 series · 2 of 2 positions shown · non-contrast
Comparison: [DATE].

CLINICAL DATA: Shortness of breath.  Weakness.

EXAM:
CHEST  2 VIEW

[chest ap]
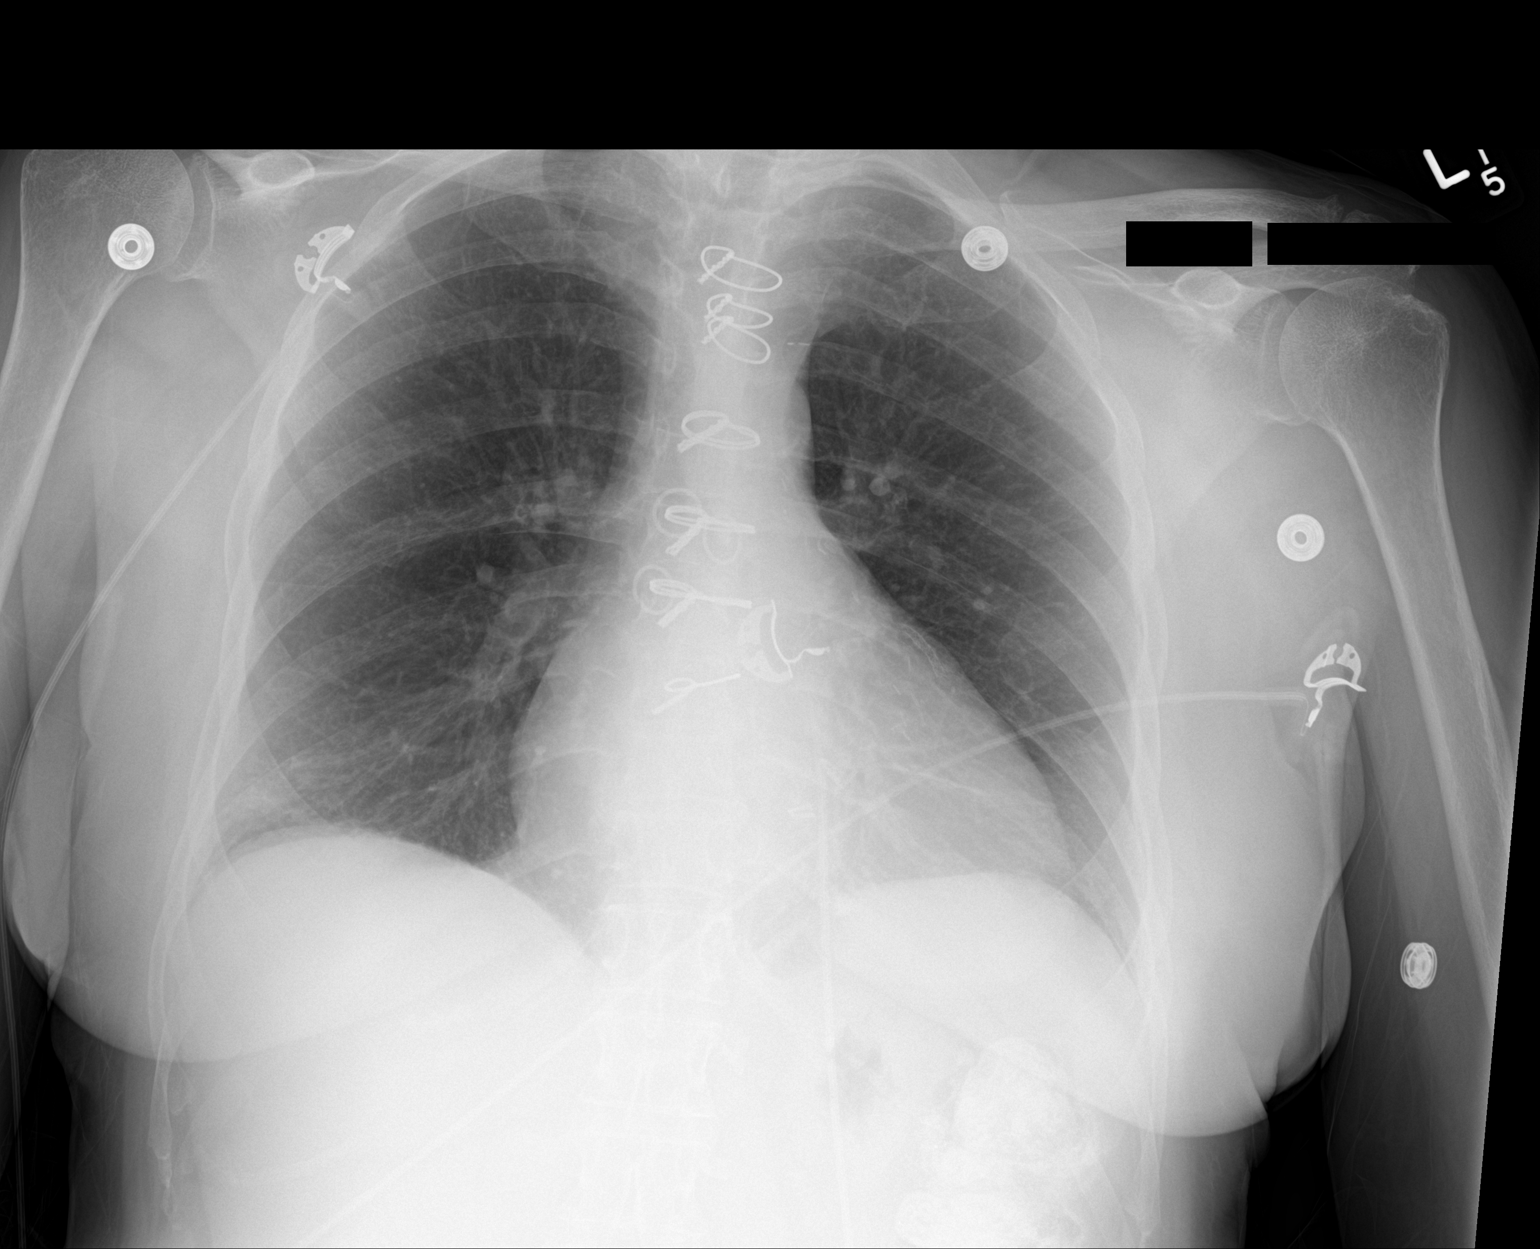

[chest lat]
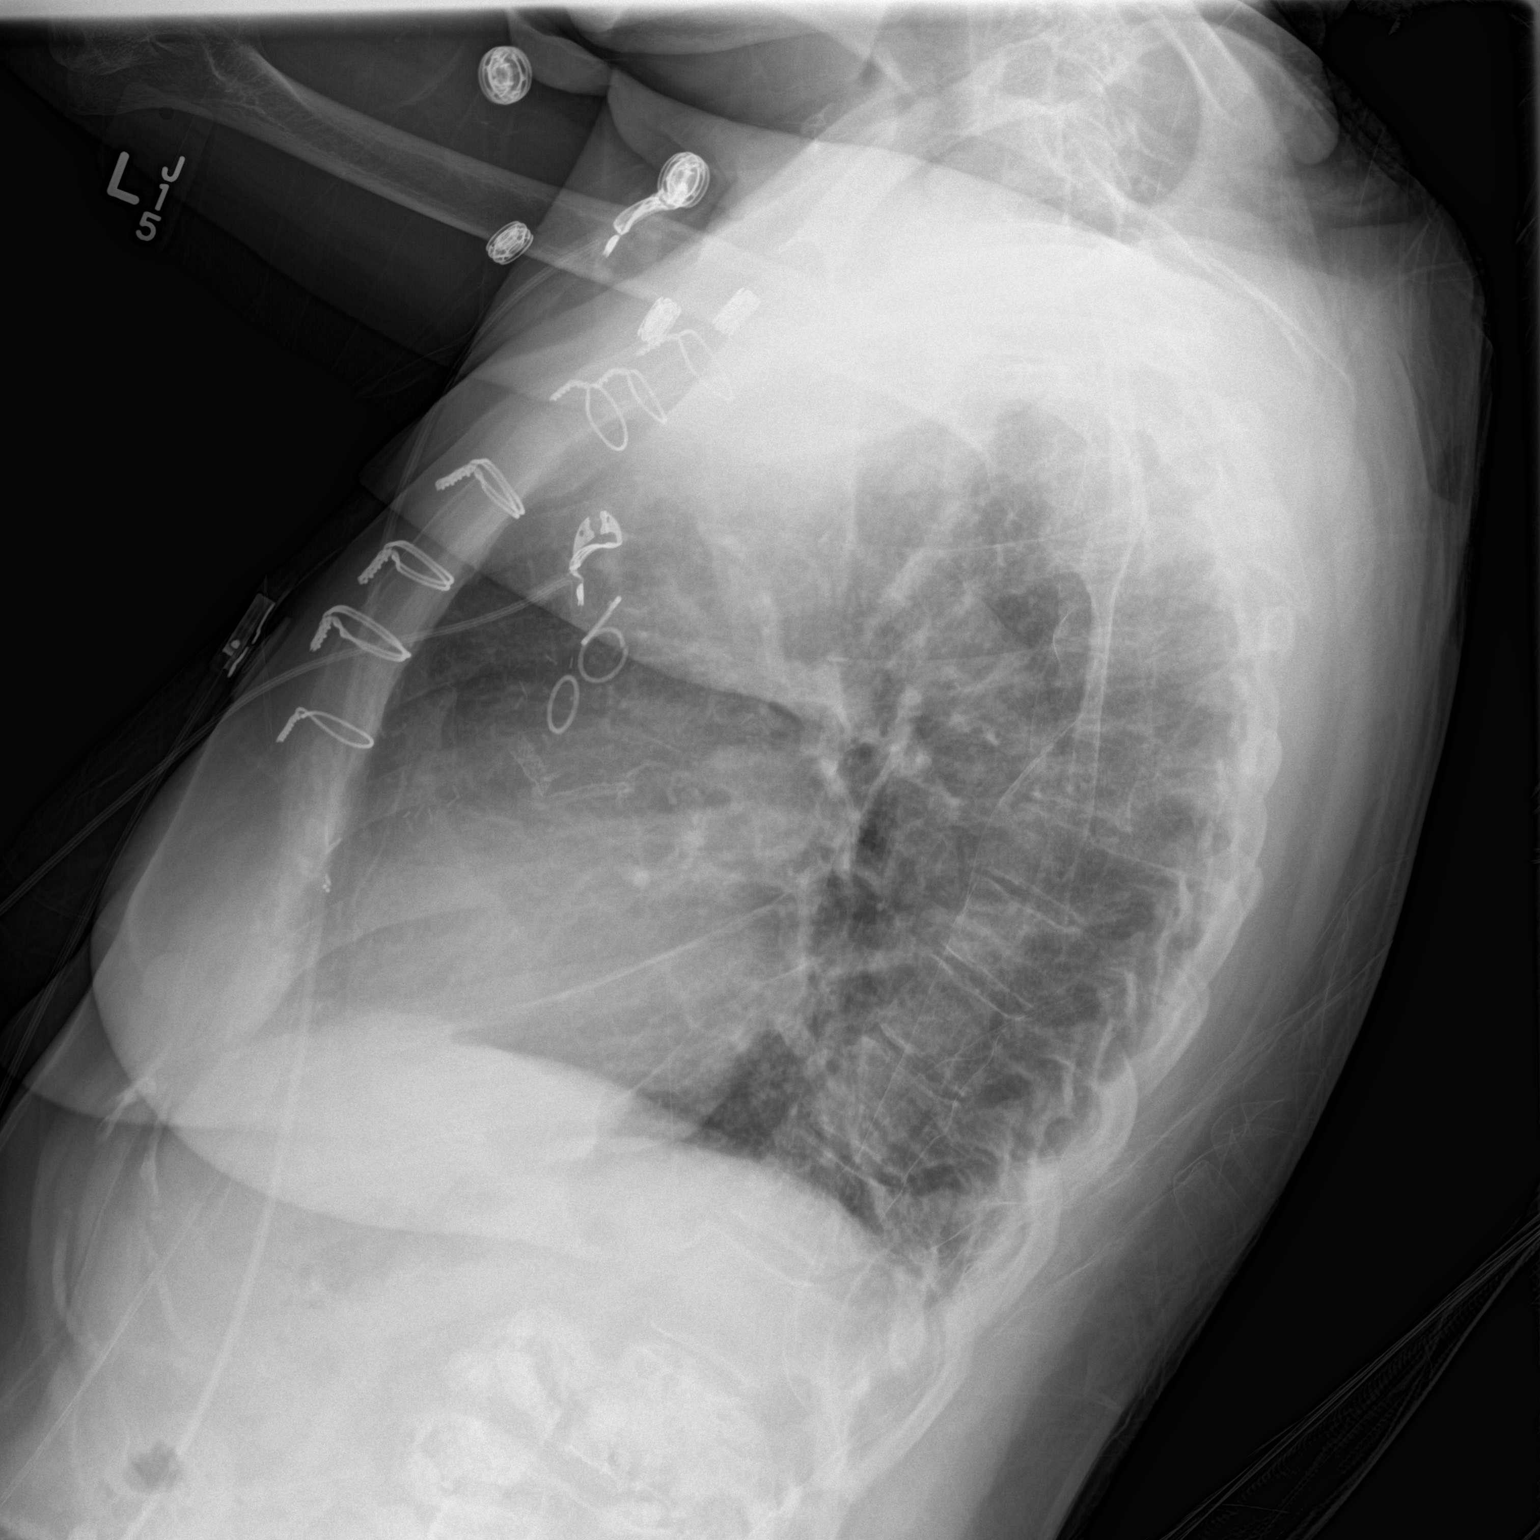

[2 of 2 positions shown; findings below may reference images not displayed]

FINDINGS: Interim extubation removal of NG tube. Prior CABG. Stable
cardiomegaly. Interim clearing of bilateral pulmonary infiltrates
and pleural effusions. Findings consistent with clearing of
congestive heart failure. No pneumothorax.
IMPRESSION: 1. Prior CABG.  Stable cardiomegaly .
2. Interim clearing of CHF.

## 2015-01-27 MED ORDER — HYDRALAZINE HCL 50 MG PO TABS: 50.0000 mg | ORAL_TABLET | Freq: Three times a day (TID) | ORAL | Status: DC

## 2015-01-27 MED ORDER — ALPRAZOLAM 0.5 MG PO TABS: 0.5000 mg | ORAL_TABLET | Freq: Three times a day (TID) | ORAL | Status: DC

## 2015-01-27 NOTE — Discharge Summary (Signed)
Physician Discharge Summary  Shawna [name] [id number] DOB: 1941/05/31 DOA: 01/21/2015  PCP: [name], MD  Admit date: 01/21/2015 Discharge date: 01/27/2015  Recommendations for Outpatient Follow-up:  1. Pt will need to follow up with PCP in 2-3 weeks post discharge 2. Please also check CBC to evaluate Hg and Hct levels   Discharge Diagnoses:  Active Problems:   Acute respiratory failure   SOB (shortness of breath) on exertion   Essential hypertension   Encephalopathy acute   Discharge Condition: Stable  Diet recommendation: Renal diet    Brief narrative:    74 year old female, ESRD on HD MWF. To IR 7/13 for angioplasty of fistula. She was profoundly hypertensive throughout the procedure, and eventually became unresponsive, requiring intubation and admission to ICU.   SIGNIFICANT EVENTS: 7/13: To IR for angio of fistula, found to be hypertensive. Became unresponsive and required intubation. Admitted to ICU. 7/18; [facility] assumed care   Assessment/Plan:    Acute hypoxic respiratory failure in the setting of acute encephalopathy  - ETT 7/13 --> 7/16 - now clear mental status - respiratory status stable  - no signs of developing PNA on CXR   HTN-ive emergency - SBP in 170's - continue catapres 0.3 mg BID, labetalol 200 mg BID, isordil 20 mg TID - also added Hydralazine for better BP control   ESRD with malfunctioning AV graft. - management per nephrology team   Anemia of chronic disease, ESRD  - monitor CBC  - EPO per nephrology team    Code Status: Full.  Family Communication: plan of care discussed with the patient Disposition Plan: Home   IV access:  LUA AVF  Procedures and diagnostic studies:   US Abdomen Complete 01/06/2015 Echogenic liver parenchyma may indicate fatty infiltration. No focal hepatic abnormality is seen. 2. Echogenic renal parenchyma and the kidneys are small, consistent with chronic renal medical disease. Small cysts  are present but no hydronephrosis is seen. 3. 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 Signed, Dulcy Fanny. Earleen Newport, DO [facility] Radiology ACCESS: Should this access re- thrombosis, it may be difficult to navigate through the aneurysm. Consideration of surgical revision may be considered.   Dg Chest Port 1 View 01/21/2015 Endotracheal tube with the tip 4 cm above the carina. Nasogastric tube coursing below the diaphragm.   Ir Declot Left Mod Sed 01/22/2015 Should this access re- thrombosis, it may be difficult to navigate through the aneurysm. Consideration of surgical revision may be considered.   Medical Consultants:  Nephrology   Other Consultants:  None  IAnti-Infectives:   None        Discharge Exam: Filed Vitals:   01/27/15 0906  BP: 134/40  Pulse: 90  Temp: 98.5 F (36.9 C)  Resp: 18   Filed Vitals:   01/26/15 2054 01/27/15 0413 01/27/15 0436 01/27/15 0906  BP: 178/57  125/43 134/40  Pulse: 87  86 90  Temp: 99.3 F (37.4 C)  98.9 F (37.2 C) 98.5 F (36.9 C)  TempSrc: Oral   Oral  Resp: 18  17 18   Height:      Weight:  58.9 kg (129 lb 13.6 oz)    SpO2: 99%  94% 97%    General: Pt is alert, follows commands appropriately, not in acute distress Cardiovascular: Regular rate and rhythm, S1/S2 +, no  rubs, no gallops Respiratory: Clear to auscultation bilaterally, no wheezing, no crackles, no rhonchi Abdominal: Soft, non tender, non distended, bowel sounds +, no guarding Extremities: no edema, no cyanosis, pulses palpable  Discharge Instructions  Discharge Instructions    Diet - low sodium heart healthy    Complete by:  As directed      Increase activity slowly    Complete by:  As directed              Medication List    TAKE these medications        acetaminophen 325 MG tablet  Commonly known as:  TYLENOL  Take 2 tablets (650 mg total) by mouth every 6 (six) hours as needed for mild pain (mild pain).     ALPRAZolam 0.5 MG tablet  Commonly known as:  XANAX  Take 1 tablet (0.5 mg total) by mouth 3 (three) times daily.     amLODipine 10 MG tablet  Commonly known as:  NORVASC  Take 10 mg by mouth daily.     aspirin EC 81 MG tablet  Take 81 mg by mouth daily.     benzonatate 100 MG capsule  Commonly known as:  TESSALON PERLES  Take 1 capsule (100 mg total) by mouth 3 (three) times daily as needed for cough.     cloNIDine 0.3 MG tablet  Commonly known as:  CATAPRES  Take 1 tablet (0.3 mg total) by mouth 2 (two) times daily.     dextromethorphan-guaiFENesin 30-600 MG per 12 hr tablet  Commonly known as:  MUCINEX DM  Take 1 tablet by mouth daily.     fluticasone 50 MCG/ACT nasal spray  Commonly known as:  FLONASE  Place 2 sprays into the nose daily as needed for allergies.     folic acid-vitamin b complex-vitamin c-selenium-zinc 3 MG Tabs tablet  Take 1 tablet by mouth daily.     hydrALAZINE 50 MG tablet  Commonly known as:  APRESOLINE  Take 1 tablet (50 mg total) by mouth 3 (three) times daily.     isosorbide dinitrate 20 MG tablet  Commonly known as:  ISORDIL  Take 20 mg by mouth 3 (three) times daily.     labetalol 200 MG tablet  Commonly known as:  NORMODYNE  Take 200 mg by mouth 2 (two) times daily.     lanthanum 1000 MG chewable tablet  Commonly known as:  FOSRENOL  Chew 1,000 mg by mouth 3 (three) times daily after meals.     lidocaine-prilocaine cream  Commonly known as:  EMLA  Apply 1 application topically daily as needed (for port access).     loratadine 10 MG tablet  Commonly known as:  CLARITIN  Take 10 mg by mouth daily.     nitroGLYCERIN 0.4 MG SL tablet  Commonly known as:  NITROSTAT  Place 1 tablet (0.4 mg total) under the  tongue every 5 (five) minutes x 3 doses as needed for chest pain.     omeprazole 20 MG capsule  Commonly known as:  PRILOSEC  Take 1 capsule (20 mg total) by mouth daily.     polyethylene glycol packet  Commonly known as:  MIRALAX / GLYCOLAX  Take 17 g by mouth daily.     PROAIR HFA 108 (90 BASE) MCG/ACT inhaler  Generic drug:  albuterol  Inhale 1 puff into the lungs every 6 (six) hours as needed for wheezing or shortness of breath.     SENSIPAR 30 MG tablet  Generic  drug:  cinacalcet  Take 30 mg by mouth daily with supper.     simvastatin 20 MG tablet  Commonly known as:  ZOCOR  Take 1 tablet (20 mg total) by mouth at bedtime.     tetrahydrozoline 0.05 % ophthalmic solution  Place 1 drop into both eyes 2 (two) times daily as needed (irritation).            Follow-up Information    Follow up with [name], MD.   Specialty:  Family Medicine   Contact information:   [place] [address] [phone]       Call Faye Ramsay, MD.   Specialty:  Internal Medicine   Why:  As needed call my cell phone [phone]   Contact information:   [address] [place] [address] [phone]        The results of significant diagnostics from this hospitalization (including imaging, microbiology, ancillary and laboratory) are listed below for reference.     Microbiology: Recent Results (from the past 240 hour(s))  MRSA PCR Screening     Status: None   Collection Time: 01/21/15 10:34 PM  Result Value Ref Range Status   MRSA by PCR NEGATIVE NEGATIVE Final    Comment:        The GeneXpert MRSA Assay (FDA approved for NASAL specimens only), is one component of a comprehensive MRSA colonization surveillance program. It is not intended to diagnose MRSA infection nor to guide or monitor treatment for MRSA infections.      Labs: Basic Metabolic Panel:  Recent Labs Lab 01/21/15 2030 01/22/15 0351  01/22/15 1400  01/23/15 0236 01/24/15 0237 01/24/15 1345 01/25/15 0244 01/26/15 0500 01/27/15 0518  NA 135 135  --  140 137 137 142 138 135 138  K 5.6* 6.3*  --  4.0 4.3 4.7 3.9 4.6 4.1 4.1  CL 98* 100*  --  104 99* 101 102 100* 98* 97*  CO2 25 22  --  26 23 19* 22 26 21* 27  GLUCOSE 101* 104*  --  98 89 80 87 99 200* 99  BUN 54* 53*  --  18 27* 44* 19 35* 63* 36*  CREATININE 12.16* 11.69*  --  5.72* 7.83* 10.60* 5.47* 7.45* 10.73* 7.10*  CALCIUM 8.8* 8.9  --  9.0 9.1 8.9 9.3 8.8* 9.0 9.3  MG 1.6* 1.6*  --  1.7  --   --   --   --   --   --   PHOS  --   --   < > 3.7 5.3* 7.5* 5.1*  --  7.6* 6.0*  < > = values in this interval not displayed. Liver Function Tests:  Recent Labs Lab 01/23/15 0236 01/24/15 0237 01/24/15 1345 01/26/15 0500 01/27/15 0518  ALBUMIN 3.1* 2.8* 3.3* 2.9* 3.0*   CBC:  Recent Labs Lab 01/23/15 0236 01/24/15 0237 01/25/15 0244 01/26/15 0500 01/27/15 0513  WBC 10.2 10.0 8.7 7.5 6.6  HGB 8.6* 7.4* 7.9* 7.6* 8.3*  HCT 28.0* 24.0* 25.6* 24.0* 26.5*  MCV 97.9 98.0 99.2 94.9 96.0  PLT 205 207 220 279 282   Cardiac Enzymes:  Recent Labs Lab 01/21/15 2030  TROPONINI 0.03   CBG:  Recent Labs Lab 01/23/15 1257 01/23/15 1617 01/23/15 1948 01/23/15 2341 01/24/15 0331  GLUCAP 90 75 79 77 84     SIGNED: Time coordinating discharge: 30 minutes  MAGICK-Jatinder Mcdonagh, MD  [facility] 01/27/2015, 10:15 AM Pager [phone]  If  7PM-7AM, please contact night-coverage [email or web address] Password [email or web address]

## 2015-01-27 NOTE — Care Management Note (Signed)
Case Management Note  Patient Details  Name: Shawna [name] MRN: [id number] Date of Birth: June 10, 1941  Subjective/Objective:            CM following for progression and d/c planning        Action/Plan: 01/27/2015  Pt still not OOB due to fem cath, cath pulled this am and pt will be ambulated this pm and evaluated for [facility] needs, pt states that she has a walker for use at home if needed.   Expected Discharge Date:       01/27/2015           Expected Discharge Plan:  [place]  In-House Referral:     Discharge planning Services     Post Acute Care Choice:    Choice offered to:     DME Arranged:    DME Agency:     HH Arranged:    [place] Agency:     Status of Service:  In process, will continue to follow  Medicare Important Message Given:  Yes-second notification given Date Medicare IM Given:    Medicare IM give by:    Date Additional Medicare IM Given:    Additional Medicare Important Message give by:     If discussed at [place] of Stay Meetings, dates discussed:    Additional Comments:  Adron Bene, RN 01/27/2015, 12:21 PM

## 2015-01-27 NOTE — Evaluation (Signed)
Physical Therapy Evaluation Patient Details Name: MELISSAANN AXLINE MRN: [id number] DOB: [birth year] Today's Date: 01/27/2015   History of Present Illness  74 year old female, ESRD on HD MWF. To IR 7/13 for angioplasty of fistula. She was profoundly hypertensive throughout the procedure, and eventually became unresponsive, requiring intubation 7/13-7/16  Clinical Impression  Pt admitted with above diagnosis. Pt currently with functional limitations due to the deficits listed below (see PT Problem List).  Pt will benefit from skilled PT to increase their independence and safety with mobility to allow discharge to the venue listed below.       Follow Up Recommendations Home health PT;Supervision - Intermittent    Equipment Recommendations  None recommended by PT    Recommendations for Other Services       Precautions / Restrictions Precautions Precautions: Fall Precaution Comments: Reports felt woozy with initial stand      Mobility  Bed Mobility Overal bed mobility: Independent                Transfers Overall transfer level: Needs assistance Equipment used: None;Rolling walker (2 wheeled) Transfers: Sit to/from Stand Sit to Stand: Min assist         General transfer comment: initially quite woozy on initial stand with noted incr sway posteriorly and bracing backs of LEs against the bed; second sit to stand much smoother and more steady , without bracing  Ambulation/Gait Ambulation/Gait assistance: Min guard Ambulation Distance (Feet): 120 Feet Assistive device: Rolling walker (2 wheeled) Gait Pattern/deviations: Step-through pattern     General Gait Details: Tending to have RW too far in front of her, but corrects with cues; Cues also to self-monitor for activity tolerance  Stairs            Wheelchair Mobility    Modified Rankin (Stroke Patients Only)       Balance Overall balance assessment: Needs assistance             Standing balance  comment: Benefits from UE support in standing                             Pertinent Vitals/Pain Pain Assessment: No/denies pain    Home Living Family/patient expects to be discharged to:: Private residence Living Arrangements: Spouse/significant other Available Help at Discharge: Family;Available 24 hours/day Type of Home: House Home Access: Level entry     Home Layout: One level Home Equipment: Walker - 2 wheels;Cane - single point      Prior Function Level of Independence: Independent               Hand Dominance        Extremity/Trunk Assessment   Upper Extremity Assessment: Overall WFL for tasks assessed           Lower Extremity Assessment: Generalized weakness         Communication   Communication: No difficulties  Cognition Arousal/Alertness: Awake/alert Behavior During Therapy: WFL for tasks assessed/performed Overall Cognitive Status: Within Functional Limits for tasks assessed                      General Comments General comments (skin integrity, edema, etc.): See vitals flow sheet for serial BPs during activity    Exercises        Assessment/Plan    PT Assessment Patient needs continued PT services  PT Diagnosis Difficulty walking;Generalized weakness   PT Problem List Decreased strength;Decreased activity tolerance;Decreased  balance;Decreased mobility;Decreased knowledge of use of DME;Decreased knowledge of precautions  PT Treatment Interventions DME instruction;Gait training;Stair training;Functional mobility training;Therapeutic activities;Therapeutic exercise;Patient/family education   PT Goals (Current goals can be found in the Care Plan section) Acute Rehab PT Goals Patient Stated Goal: hopes to go home today PT Goal Formulation: With patient Time For Goal Achievement: 02/10/15 Potential to Achieve Goals: Good    Frequency Min 3X/week   Barriers to discharge        Co-evaluation                End of Session Equipment Utilized During Treatment: Gait belt Activity Tolerance: Patient tolerated treatment well Patient left: in bed;with call bell/phone within reach;with family/visitor present Nurse Communication: Mobility status         Time: [id number] PT Time Calculation (min) (ACUTE ONLY): 30 min   Charges:   PT Evaluation $Initial PT Evaluation Tier I: 1 Procedure PT Treatments $Gait Training: 8-22 mins   PT G Codes:        Quin Hoop 01/27/2015, 4:16 PM  Roney Marion, [place] Pager [phone] Office [phone]

## 2015-01-27 NOTE — Progress Notes (Signed)
Subjective: Interval History: has no complaint, has bruises on arm.  Objective: Vital signs in last 24 hours: Temp:  [98.1 F (36.7 C)-99.3 F (37.4 C)] 98.9 F (37.2 C) (07/19 0436) Pulse Rate:  [54-87] 86 (07/19 0436) Resp:  [17-25] 17 (07/19 0436) BP: (120-178)/(43-72) 125/43 mmHg (07/19 0436) SpO2:  [94 %-99 %] 94 % (07/19 0436) Weight:  [58.9 kg (129 lb 13.6 oz)-61 kg (134 lb 7.7 oz)] 58.9 kg (129 lb 13.6 oz) (07/19 0413) Weight change: 0.7 kg (1 lb 8.7 oz)  Intake/Output from previous day: 07/18 0701 - 07/19 0700 In: 482 [P.O.:392; I.V.:90] Out: 1981  Intake/Output this shift:    General appearance: alert, cooperative and no distress Resp: clear to auscultation bilaterally Cardio: S1, S2 normal and systolic murmur: holosystolic 2/6, blowing at apex GI: pos bs, soft, nontender Extremities: AVF LUA B&T, L fem cath  Lab Results:  Recent Labs  01/26/15 0500 01/27/15 0513  WBC 7.5 6.6  HGB 7.6* 8.3*  HCT 24.0* 26.5*  PLT 279 282   BMET:  Recent Labs  01/26/15 0500 01/27/15 0518  NA 135 138  K 4.1 4.1  CL 98* 97*  CO2 21* 27  GLUCOSE 200* 99  BUN 63* 36*  CREATININE 10.73* 7.10*  CALCIUM 9.0 9.3   No results for input(s): PTH in the last 72 hours. Iron Studies: No results for input(s): IRON, TIBC, TRANSFERRIN, FERRITIN in the last 72 hours.  Studies/Results: Dg Chest 2 View  01/27/2015   CLINICAL DATA:  Shortness of breath.  Weakness.  EXAM: CHEST  2 VIEW  COMPARISON:  01/22/2015.  FINDINGS: Interim extubation removal of NG tube. Prior CABG. Stable cardiomegaly. Interim clearing of bilateral pulmonary infiltrates and pleural effusions. Findings consistent with clearing of congestive heart failure. No pneumothorax.  IMPRESSION: 1. Prior CABG.  Stable cardiomegaly . 2. Interim clearing of CHF.   Electronically Signed   By: Marcello Moores  Register   On: 01/27/2015 07:47    I have reviewed the patient's current medications.  Assessment/Plan: 1 ESRD did well with  fistula, get fem out. 2 Anemia stable 3 CAD 4 XS sedation cause resp arrest 5 HPHT P Can d/c home from our standpoint    LOS: 6 days   Gerritt Galentine L 01/27/2015,9:02 AM

## 2015-01-27 NOTE — Discharge Instructions (Signed)
Altered Mental Status °Altered mental status most often refers to an abnormal change in your responsiveness and awareness. It can affect your speech, thought, mobility, memory, attention span, or alertness. It can range from slight confusion to complete unresponsiveness (coma). Altered mental status can be a sign of a serious underlying medical condition. Rapid evaluation and medical treatment is necessary for patients having an altered mental status. °CAUSES  °· Low blood sugar (hypoglycemia) or diabetes. °· Severe loss of body fluids (dehydration) or a body salt (electrolyte) imbalance. °· A stroke or other neurologic problem, such as dementia or delirium. °· A head injury or tumor. °· A drug or alcohol overdose. °· Exposure to toxins or poisons. °· Depression, anxiety, and stress. °· A low oxygen level (hypoxia). °· An infection. °· Blood loss. °· Twitching or shaking (seizure). °· Heart problems, such as heart attack or heart rhythm problems (arrhythmias). °· A body temperature that is too low or too high (hypothermia or hyperthermia). °DIAGNOSIS  °A diagnosis is based on your history, symptoms, physical and neurologic examinations, and diagnostic tests. Diagnostic tests may include: °· Measurement of your blood pressure, pulse, breathing, and oxygen levels (vital signs). °· Blood tests. °· Urine tests. °· X-ray exams. °· A computerized magnetic scan (magnetic resonance imaging, MRI). °· A computerized X-ray scan (computed tomography, CT scan). °TREATMENT  °Treatment will depend on the cause. Treatment may include: °· Management of an underlying medical or mental health condition. °· Critical care or support in the hospital. °HOME CARE INSTRUCTIONS  °· Only take over-the-counter or prescription medicines for pain, discomfort, or fever as directed by your caregiver. °· Manage underlying conditions as directed by your caregiver. °· Eat a healthy, well-balanced diet to maintain strength. °· Join a support group or  prevention program to cope with the condition or trauma that caused the altered mental status. Ask your caregiver to help choose a program that works for you. °· Follow up with your caregiver for further examination, therapy, or testing as directed. °SEEK MEDICAL CARE IF:  °· You feel unwell or have chills. °· You or your family notice a change in your behavior or your alertness. °· You have trouble following your caregiver's treatment plan. °· You have questions or concerns. °SEEK IMMEDIATE MEDICAL CARE IF:  °· You have a rapid heartbeat or have chest pain. °· You have difficulty breathing. °· You have a fever. °· You have a headache with a stiff neck. °· You cough up blood. °· You have blood in your urine or stool. °· You have severe agitation or confusion. °MAKE SURE YOU:  °· Understand these instructions. °· Will watch your condition. °· Will get help right away if you are not doing well or get worse. °Document Released: 12/15/2009 Document Revised: 09/19/2011 Document Reviewed: 12/15/2009 °[employer]® Patient Information ©2015 [employer]. This information is not intended to replace advice given to you by your health care provider. Make sure you discuss any questions you have with your health care provider. ° °

## 2015-01-27 NOTE — Evaluation (Signed)
Clinical/Bedside Swallow Evaluation Patient Details  Name: Shawna [name] MRN: [id number] Date of Birth: 1941-06-15  Today's Date: 01/27/2015 Time: SLP Start Time (ACUTE ONLY): 1320 SLP Stop Time (ACUTE ONLY): 1329 SLP Time Calculation (min) (ACUTE ONLY): 9 min  Past Medical History:  Past Medical History  Diagnosis Date  . Chronic bronchitis   . GERD (gastroesophageal reflux disease)   . PUD (peptic ulcer disease)   . History of lower GI bleeding   . Arthritis   . History of gout   . CAD (coronary artery disease)     a. 12/2011 NSTEMI/Cath/PCI LCX (2.25x14 Resolute DES) & D1 (2.25x22 Resolute DES);  b. 01/2012 Cath/PCI: LM 30, LAD 30p, 40-2m, D1 stent ok, 99 in sm branch of diag, LCX patent stent, OM1 20, RCA 95 ost (4.0x12 Promus DES), EF 55%;  c. 04/2012 Lexi Cardiolite  EF 48%, small area of scar @ base/mid inflat wall with mild peri-infarct ischemia.; CABG 12/4  . High cholesterol 12/2011  . Pneumonia ~ 2009  . Iron deficiency anemia   . TIA (transient ischemic attack)   . Anxiety   . History of blood transfusion 07/2011; 12/2011; 01/2012 X 2; 04/2012  . Carotid artery disease     a. [id number] LICA, [id number]   . Mitral regurgitation     a. Moderate by echo, 02/2012  . Myocardial infarction   . Chronic diastolic CHF (congestive heart failure)     a. 02/2012 Echo EF 60-65%, nl wall motion, Gr 1 DD, mod MR  . Hypertension   . AVM (arteriovenous malformation) of colon   . Esophageal stricture   . Ovarian cancer 1992  . Colon cancer 1992  . ESRD on hemodialysis     ESRD due to HTN, started dialysis 2011 and gets HD at [facility] with Dr Hinda Lenis on MWF schedule.  Access is LUA AVF as of Sept 2014.    Past Surgical History:  Past Surgical History  Procedure Laterality Date  . Abdominal hysterectomy  1992  . Appendectomy  06/1990  . Tubal ligation  1980's  . Av fistula placement  07/2009    left upper arm  . Thrombectomy / arteriovenous graft revision  2011    left upper arm  . Colon  resection  1992  . Esophagogastroduodenoscopy  01/20/2012    Procedure: ESOPHAGOGASTRODUODENOSCOPY (EGD);  Surgeon: Ladene Artist, MD,FACG;  Location: [facility] ENDOSCOPY;  Service: Endoscopy;  Laterality: N/A;  . Dilation and curettage of uterus    . Coronary angioplasty with stent placement  12/15/11    "2"  . Coronary angioplasty with stent placement  y/2013    "1; makes total of 3" (05/02/2012)  . Coronary artery bypass graft  06/13/2012    Procedure: CORONARY ARTERY BYPASS GRAFTING (CABG);  Surgeon: Grace Isaac, MD;  Location: [place];  Service: Open Heart Surgery;  Laterality: N/A;  cabg x four;  using left internal mammary artery, and left leg greater saphenous vein harvested endoscopically  . Intraoperative transesophageal echocardiogram  06/13/2012    Procedure: INTRAOPERATIVE TRANSESOPHAGEAL ECHOCARDIOGRAM;  Surgeon: Grace Isaac, MD;  Location: [place];  Service: Open Heart Surgery;  Laterality: N/A;  . Esophagogastroduodenoscopy N/A 03/26/2013    Procedure: ESOPHAGOGASTRODUODENOSCOPY (EGD);  Surgeon: Irene Shipper, MD;  Location: [facility] ENDOSCOPY;  Service: Endoscopy;  Laterality: N/A;  . Ovary surgery      ovarian cancer  . Cardiac surgery    . Left heart catheterization with coronary angiogram N/A 12/15/2011    Procedure:  LEFT HEART CATHETERIZATION WITH CORONARY ANGIOGRAM;  Surgeon: Burnell Blanks, MD;  Location: [facility] CATH LAB;  Service: Cardiovascular;  Laterality: N/A;  . Left heart catheterization with coronary angiogram N/A 01/10/2012    Procedure: LEFT HEART CATHETERIZATION WITH CORONARY ANGIOGRAM;  Surgeon: Peter M Martinique, MD;  Location: [facility] CATH LAB;  Service: Cardiovascular;  Laterality: N/A;  . Left heart catheterization with coronary angiogram N/A 06/08/2012    Procedure: LEFT HEART CATHETERIZATION WITH CORONARY ANGIOGRAM;  Surgeon: Burnell Blanks, MD;  Location: [facility] CATH LAB;  Service: Cardiovascular;  Laterality: N/A;  . Shuntogram N/A 10/15/2013    Procedure:  Fistulogram;  Surgeon: Serafina Mitchell, MD;  Location: [facility] CATH LAB;  Service: Cardiovascular;  Laterality: N/A;  . Left heart catheterization with coronary/graft angiogram N/A 12/10/2013    Procedure: LEFT HEART CATHETERIZATION WITH Beatrix Fetters;  Surgeon: [name], MD;  Location: [facility] CATH LAB;  Service: Cardiovascular;  Laterality: N/A;   HPI:  74 year old female, ESRD on HD MWF. To IR 7/13 for angioplasty of fistula. She was profoundly hypertensive throughout the procedure, and eventually became unresponsive, requiring intubation 7/13-7/16.  Assessment / Plan / Recommendation Clinical Impression  Bedside swallow evaluation completed.  Patient's oral motor exam was unremarkable and she was observed while consuming regular textures and thin liquids with slightly prolonged mastication and no overt s/s of aspiration.  Of note, patient reported yesterday she had a pain in throat upon swallow; however, today it is gone.  Given that oral phase is functional with increased time and that pharyngeal phase of swallow appears timely with good hyolaryngeal excursion no skilled SLP services are warranted at this time.  SLP signing off.       Aspiration Risk  Mild    Diet Recommendation Age appropriate regular solids;Thin   Medication Administration: Whole meds with liquid    Other  Recommendations Oral Care Recommendations: Oral care BID   Follow Up Recommendations   None           Pertinent Vitals/Pain None     Swallow Study Prior Functional Status       General Date of Onset: 01/21/15 Other Pertinent Information: 74 year old female, ESRD on HD MWF. To IR 7/13 for angioplasty of fistula. She was profoundly hypertensive throughout the procedure, and eventually became unresponsive, requiring intubation Type of Study: Bedside swallow evaluation Previous Swallow Assessment: none on record Diet Prior to this Study: Regular;Thin liquids Temperature Spikes Noted:  No Respiratory Status: Room air History of Recent Intubation: Yes Length of Intubations (days): 4 days Date extubated: 01/24/15 Behavior/Cognition: Alert;Cooperative;Pleasant mood Oral Cavity - Dentition: Adequate natural dentition/normal for age Self-Feeding Abilities: Able to feed self Patient Positioning: Upright in bed Baseline Vocal Quality: Normal Volitional Cough: Strong Volitional Swallow: Able to elicit    Oral/Motor/Sensory Function Overall Oral Motor/Sensory Function: Appears within functional limits for tasks assessed   Ice Chips Ice chips: Not tested   Thin Liquid Thin Liquid: Within functional limits Presentation: Cup;Self Fed;Straw    Nectar Thick Nectar Thick Liquid: Not tested   Honey Thick Honey Thick Liquid: Not tested   Puree Puree: Within functional limits Presentation: Self Fed;Spoon   Solid   GO    Solid: Within functional limits Other Comments: slightly prolonged mastication, but functional       Gunnar Fusi, M.A., CCC-SLP [phone]  [place] Estey 01/27/2015,2:21 PM

## 2015-01-27 NOTE — Telephone Encounter (Signed)
   Diagnosis:    Result(s)   Card 1: Positive     Card 2:Negative:   Card 3: Positive    Completed by: Lelon Perla   HEMOCCULT SENSA DEVELOPER: LOT#:  [id number] EXPIRATION DATE: 9/17   HEMOCCULT SENSA CARD:  LOT#:  [id number] EXPIRATION DATE: 7-18   CARD CONTROL RESULTS:  POSITIVE: Positive NEGATIVE: Negative    ADDITIONAL COMMENTS:

## 2015-01-27 NOTE — Progress Notes (Signed)
Pt being discharged home via wheelchair with husband. Pt alert and oriented x4. VSS. Pt c/o no pain at this time. No signs of respiratory distress. Education complete and care plans resolved. Femoral catheter removed via IV team. Catheter intact and pt tolerated well. No further issues at this time. Pt to follow up with PCP. Leanne Chang, RN

## 2015-01-28 NOTE — Care Management Note (Signed)
Case Management Note  Patient Details  Name: Shawna [name] MRN: [id number] Date of Birth: Mar 12, 1941  Subjective/Objective:               CM following for progression and d/c planning.     Action/Plan: 01/27/2015 Met with pt and husband re [facility] services, pt requested [facility] services from previous agency however could not remember agency name. This CM researched previous admissions and located notes indicating that pt has used [facility] in the past.  01/28/2015 Contacted [place] and info faxed to that agency for [facility] services.   Expected Discharge Date:       01/27/2015           Expected Discharge Plan:  [place]  In-House Referral:     Discharge planning Services  CM Consult  Post Acute Care Choice:    Choice offered to:     DME Arranged:    DME Agency:     HH Arranged:  RN, PT, OT [place] Agency:  Other - See comment  Status of Service:  Completed, signed off  Medicare Important Message Given:  Yes-second notification given Date Medicare IM Given:    Medicare IM give by:    Date Additional Medicare IM Given:    Additional Medicare Important Message give by:     If discussed at [place] of Stay Meetings, dates discussed:    Additional Comments: Home health to be provided by [facility].   Paulette Lynch, [name], RN 01/28/2015, 11:59 AM

## 2015-02-11 ENCOUNTER — Telehealth (INDEPENDENT_AMBULATORY_CARE_PROVIDER_SITE_OTHER): Payer: Self-pay | Admitting: *Deleted

## 2015-02-11 NOTE — Telephone Encounter (Signed)
Shawna [name] is having vomiting and diarrhea for 3 days. Patient advised to see PCP first and if she needs to see GI to please give [place] a call back and we will be glad to see her.

## 2015-02-11 NOTE — Telephone Encounter (Signed)
Message left at home 

## 2015-02-12 NOTE — Telephone Encounter (Signed)
Patient has called again at 12:35 wants to speak to Karna Christmas

## 2015-02-24 ENCOUNTER — Other Ambulatory Visit: Payer: Self-pay | Admitting: Cardiovascular Disease

## 2015-02-25 ENCOUNTER — Other Ambulatory Visit: Payer: Self-pay | Admitting: Cardiovascular Disease

## 2015-02-27 ENCOUNTER — Ambulatory Visit: Payer: Medicare Other | Admitting: Cardiovascular Disease

## 2015-02-27 NOTE — Telephone Encounter (Signed)
Spoke with Karna Christmas and she said she would look at this message.

## 2015-02-28 ENCOUNTER — Other Ambulatory Visit: Payer: Self-pay | Admitting: Gastroenterology

## 2015-03-05 NOTE — Telephone Encounter (Signed)
I have talked to patient. Her hemoglobin yesterday was 10.1. She has an OV in September. She has not had any rectal bleeding. Will evaluate at [place] in September for possible EGD

## 2015-03-06 ENCOUNTER — Encounter: Payer: Self-pay | Admitting: Cardiovascular Disease

## 2015-03-06 ENCOUNTER — Ambulatory Visit (INDEPENDENT_AMBULATORY_CARE_PROVIDER_SITE_OTHER): Payer: Medicare Other | Admitting: Cardiovascular Disease

## 2015-03-06 VITALS — BP 140/62 | HR 82 | Ht 62.0 in | Wt 136.0 lb

## 2015-03-06 DIAGNOSIS — Z951 Presence of aortocoronary bypass graft: Secondary | ICD-10-CM

## 2015-03-06 DIAGNOSIS — N186 End stage renal disease: Secondary | ICD-10-CM | POA: Diagnosis not present

## 2015-03-06 DIAGNOSIS — I10 Essential (primary) hypertension: Secondary | ICD-10-CM | POA: Diagnosis not present

## 2015-03-06 DIAGNOSIS — I779 Disorder of arteries and arterioles, unspecified: Secondary | ICD-10-CM

## 2015-03-06 DIAGNOSIS — Z9289 Personal history of other medical treatment: Secondary | ICD-10-CM

## 2015-03-06 DIAGNOSIS — Z87898 Personal history of other specified conditions: Secondary | ICD-10-CM

## 2015-03-06 DIAGNOSIS — I5032 Chronic diastolic (congestive) heart failure: Secondary | ICD-10-CM | POA: Diagnosis not present

## 2015-03-06 DIAGNOSIS — I739 Peripheral vascular disease, unspecified: Secondary | ICD-10-CM

## 2015-03-06 DIAGNOSIS — I25812 Atherosclerosis of bypass graft of coronary artery of transplanted heart without angina pectoris: Secondary | ICD-10-CM

## 2015-03-06 NOTE — Progress Notes (Signed)
Patient ID: Shawna [name], female   DOB: 10/11/1940, 74 y.o.   MRN: [id number]      SUBJECTIVE: [name] returns for a follow up visit. In summary, she has a history of coronary artery disease status post CABG, malignant hypertension, hyperlipidemia, chronic diastolic heart failure, and end-stage renal disease on hemodialysis.   She had a normal nuclear cardiac stress test on 08/25/14, LVEF 57%. Coronary angiography on 12/10/13 demonstrated a patent left main coronary artery, focal significant disease in the mid to distal left anterior descending artery, patent diagonal branch, LIMA to LAD was widely patent, severe restenosis of the proximal left circumflex artery stent, patent SVG to circumflex, 70% proximal ramus stenosis, widely patent SVG to ramus, occluded stent at the ostium of the right coronary artery, and widely patent SVG to distal RCA.  Echocardiogram on 11/27/13 demonstrated normal left ventricular systolic function, EF 0000000, moderate LVH, grade 1 diastolic dysfunction, elevated filling pressures, and mild to moderate mitral regurgitation.  Since her last visit,  She was hospitalized for acute hypoxic respiratory failure in the setting of acute encephalopathy. She also had hypertensive emergency.  She underwent mechanical and pharmacologic thrombectomy /thrombolysis of left upper extremity brachiocephalic fistula with balloon angioplasty of tandem stenoses of the outflow.  She had some degree of CHF by chest x-ray on 7/14.  She is unclear about the events surrounding her hospitalization. It appears she had hypertensive urgency with hypertensive encephalopathy. She thinks she was overmedicated by anesthesia. She currently denies chest pain. Her blood pressure is controlled today but it had been as high as 0000000 mmHg systolic last night. She stopped taking amlodipine due to leg swelling and this has since resolved.   Review of Systems: As per "subjective", otherwise negative.  Allergies   Allergen Reactions  . Aspirin Other (See Comments)    Mess up her stomach; "makes my bowels have blood in them". Takes 81 mg EC Aspirin   . Contrast Media [Iodinated Diagnostic Agents] Itching  . Iron Itching and Other (See Comments)    "they gave me iron in dialysis; had to give me Benadryl cause I had to have the iron" (05/02/2012)  . Macrodantin [Nitrofurantoin Macrocrystal] Other (See Comments)    "broke me out in big old knots all over my body; had to go to ER"  . Penicillins Other (See Comments)    "makes me real weak when I take it; like I'll pass out"  . Plavix [Clopidogrel Bisulfate] Rash  . Amlodipine Swelling    Leg swelling  . Bactrim [Sulfamethoxazole-Trimethoprim] Rash  . Dexilant [Dexlansoprazole] Other (See Comments)    Upset stomach  . Morphine And Related Itching    Itching in feet  . Prilosec [Omeprazole] Other (See Comments)    "back spasms"  . Sulfa Antibiotics Rash  . Venofer [Ferric Oxide] Itching    Patient reports using Benadryl prior to doses as Shawna [name]  . Levaquin [Levofloxacin In D5w] Rash  . Protonix [Pantoprazole Sodium] Rash    Current Outpatient Prescriptions  Medication Sig Dispense Refill  . acetaminophen (TYLENOL) 325 MG tablet Take 2 tablets (650 mg total) by mouth every 6 (six) hours as needed for mild pain (mild pain).    Marland Kitchen ALPRAZolam (XANAX) 0.5 MG tablet Take 1 tablet (0.5 mg total) by mouth 3 (three) times daily. 30 tablet 1  . amLODipine (NORVASC) 10 MG tablet Take 10 mg by mouth daily.    Marland Kitchen aspirin EC 81 MG tablet Take 81 mg by mouth  daily.     . benzonatate (TESSALON PERLES) 100 MG capsule Take 1 capsule (100 mg total) by mouth 3 (three) times daily as needed for cough. 20 capsule 0  . cloNIDine (CATAPRES) 0.3 MG tablet Take 1 tablet (0.3 mg total) by mouth 2 (two) times daily. 60 tablet 6  . dextromethorphan-guaiFENesin (MUCINEX DM) 30-600 MG per 12 hr tablet Take 1 tablet by mouth daily. (Patient taking differently: Take 1  tablet by mouth 3 (three) times daily as needed. ) 14 tablet 1  . fluticasone (FLONASE) 50 MCG/ACT nasal spray Place 2 sprays into the nose daily as needed for allergies.     . folic acid-vitamin b complex-vitamin c-selenium-zinc (DIALYVITE) 3 MG TABS Take 1 tablet by mouth daily.    . hydrALAZINE (APRESOLINE) 50 MG tablet Take 1 tablet (50 mg total) by mouth 3 (three) times daily. 90 tablet 1  . isosorbide dinitrate (ISORDIL) 20 MG tablet Take 20 mg by mouth 3 (three) times daily.    Marland Kitchen labetalol (NORMODYNE) 200 MG tablet Take 200 mg by mouth 2 (two) times daily.    Marland Kitchen lanthanum (FOSRENOL) 1000 MG chewable tablet Chew 1,000 mg by mouth 3 (three) times daily after meals.    . lidocaine-prilocaine (EMLA) cream Apply 1 application topically daily as needed (for port access).     Marland Kitchen loratadine (CLARITIN) 10 MG tablet Take 10 mg by mouth daily.     . nitroGLYCERIN (NITROSTAT) 0.4 MG SL tablet Place 1 tablet (0.4 mg total) under the tongue every 5 (five) minutes x 3 doses as needed for chest pain. 25 tablet 3  . omeprazole (PRILOSEC) 20 MG capsule TAKE ONE CAPSULE BY MOUTH ONCE DAILY 30 capsule 0  . polyethylene glycol (MIRALAX / GLYCOLAX) packet Take 17 g by mouth daily. (Patient taking differently: Take 17 g by mouth daily as needed for mild constipation or moderate constipation. ) 14 each 0  . PROAIR HFA 108 (90 BASE) MCG/ACT inhaler Inhale 1 puff into the lungs every 6 (six) hours as needed for wheezing or shortness of breath.     . SENSIPAR 30 MG tablet Take 30 mg by mouth daily with supper.     . simvastatin (ZOCOR) 20 MG tablet Take 1 tablet (20 mg total) by mouth at bedtime. 30 tablet 3  . tetrahydrozoline 0.05 % ophthalmic solution Place 1 drop into both eyes 2 (two) times daily as needed (irritation).     No current facility-administered medications for this visit.    Past Medical History  Diagnosis Date  . Chronic bronchitis   . GERD (gastroesophageal reflux disease)   . PUD (peptic ulcer  disease)   . History of lower GI bleeding   . Arthritis   . History of gout   . CAD (coronary artery disease)     a. 12/2011 NSTEMI/Cath/PCI LCX (2.25x14 Resolute DES) & D1 (2.25x22 Resolute DES);  b. 01/2012 Cath/PCI: LM 30, LAD 30p, 40-25m, D1 stent ok, 99 in sm branch of diag, LCX patent stent, OM1 20, RCA 95 ost (4.0x12 Promus DES), EF 55%;  c. 04/2012 Lexi Cardiolite  EF 48%, small area of scar @ base/mid inflat wall with mild peri-infarct ischemia.; CABG 12/4  . High cholesterol 12/2011  . Pneumonia ~ 2009  . Iron deficiency anemia   . TIA (transient ischemic attack)   . Anxiety   . History of blood transfusion 07/2011; 12/2011; 01/2012 X 2; 04/2012  . Carotid artery disease     a. A999333 LICA, Q000111Q   .  Mitral regurgitation     a. Moderate by echo, 02/2012  . Myocardial infarction   . Chronic diastolic CHF (congestive heart failure)     a. 02/2012 Echo EF 60-65%, nl wall motion, Gr 1 DD, mod MR  . Hypertension   . AVM (arteriovenous malformation) of colon   . Esophageal stricture   . Ovarian cancer 1992  . Colon cancer 1992  . ESRD on hemodialysis     ESRD due to HTN, started dialysis 2011 and gets HD at [facility] with Dr Hinda Lenis on MWF schedule.  Access is LUA AVF as of Sept 2014.     Past Surgical History  Procedure Laterality Date  . Abdominal hysterectomy  1992  . Appendectomy  06/1990  . Tubal ligation  1980's  . Av fistula placement  07/2009    left upper arm  . Thrombectomy / arteriovenous graft revision  2011    left upper arm  . Colon resection  1992  . Esophagogastroduodenoscopy  01/20/2012    Procedure: ESOPHAGOGASTRODUODENOSCOPY (EGD);  Surgeon: Ladene Artist, MD,FACG;  Location: [facility] ENDOSCOPY;  Service: Endoscopy;  Laterality: N/A;  . Dilation and curettage of uterus    . Coronary angioplasty with stent placement  12/15/11    "2"  . Coronary angioplasty with stent placement  y/2013    "1; makes total of 3" (05/02/2012)  . Coronary artery bypass graft   06/13/2012    Procedure: CORONARY ARTERY BYPASS GRAFTING (CABG);  Surgeon: Grace Isaac, MD;  Location: [place];  Service: Open Heart Surgery;  Laterality: N/A;  cabg x four;  using left internal mammary artery, and left leg greater saphenous vein harvested endoscopically  . Intraoperative transesophageal echocardiogram  06/13/2012    Procedure: INTRAOPERATIVE TRANSESOPHAGEAL ECHOCARDIOGRAM;  Surgeon: Grace Isaac, MD;  Location: [place];  Service: Open Heart Surgery;  Laterality: N/A;  . Esophagogastroduodenoscopy N/A 03/26/2013    Procedure: ESOPHAGOGASTRODUODENOSCOPY (EGD);  Surgeon: Irene Shipper, MD;  Location: [facility] ENDOSCOPY;  Service: Endoscopy;  Laterality: N/A;  . Ovary surgery      ovarian cancer  . Cardiac surgery    . Left heart catheterization with coronary angiogram N/A 12/15/2011    Procedure: LEFT HEART CATHETERIZATION WITH CORONARY ANGIOGRAM;  Surgeon: Burnell Blanks, MD;  Location: [name] CATH LAB;  Service: Cardiovascular;  Laterality: N/A;  . Left heart catheterization with coronary angiogram N/A 01/10/2012    Procedure: LEFT HEART CATHETERIZATION WITH CORONARY ANGIOGRAM;  Surgeon: Peter M Martinique, MD;  Location: [facility] CATH LAB;  Service: Cardiovascular;  Laterality: N/A;  . Left heart catheterization with coronary angiogram N/A 06/08/2012    Procedure: LEFT HEART CATHETERIZATION WITH CORONARY ANGIOGRAM;  Surgeon: Burnell Blanks, MD;  Location: [facility] CATH LAB;  Service: Cardiovascular;  Laterality: N/A;  . Shuntogram N/A 10/15/2013    Procedure: Fistulogram;  Surgeon: Serafina Mitchell, MD;  Location: [facility] CATH LAB;  Service: Cardiovascular;  Laterality: N/A;  . Left heart catheterization with coronary/graft angiogram N/A 12/10/2013    Procedure: LEFT HEART CATHETERIZATION WITH Beatrix Fetters;  Surgeon: [name], MD;  Location: [facility] CATH LAB;  Service: Cardiovascular;  Laterality: N/A;    Social History   Social History  . Marital Status: Married    Spouse  Name: N/A  . Number of Children: N/A  . Years of Education: N/A   Occupational History  . Not on file.   Social History Main Topics  . Smoking status: Never Smoker   . Smokeless tobacco: Never Used  .  Alcohol Use: No  . Drug Use: No  . Sexual Activity: Yes    Birth Control/ Protection: Surgical   Other Topics Concern  . Not on file   Social History Narrative   Lives in [place], [place] with husband.  Dialysis pt - mwf.     Filed Vitals:   03/06/15 1549  BP: 140/62  Pulse: 82  Height: 5\' 2"  (1.575 m)  Weight: 136 lb (61.689 kg)  SpO2: 89%   Repeat sats: 94% RA  PHYSICAL EXAM General: NAD HEENT: Normal. Neck: No JVD, no thyromegaly. Lungs: Clear to auscultation bilaterally with normal respiratory effort. CV: Nondisplaced PMI. Regular rate and rhythm, normal S1/S2, no S3/S4, no murmur. No pretibial or periankle edema.Left arm bandaged.  Abdomen: Soft, no distention.  Neurologic: Alert and oriented x 3.  Psych: Normal affect. Skin: Normal. Musculoskeletal: No gross deformities. Extremities: No clubbing or cyanosis.   ECG: Most recent ECG reviewed.      ASSESSMENT AND PLAN: 1. CAD s/p CABG:  No chest pain. Normal nuclear stress test (08/25/14). I will continue ASA, labetalol, isosorbide dinitrate, and statin.   2. Malignant HTN: Controlled onclonidine, hydralazine, and labetalol. I previously instructed her not to take the afternoon doses on dialysis days when her BP occasionally drops. Stopped amlodipine due to leg swelling.  3. Hyperlipidemia: Will obtain copy of lipids.  4. Mitral regurgitation: Mild to moderate in 11/2013.   5. Carotid artery stenosis: Less than 40% bilaterally on 08/14/14. Continue surveillance monitoring.   Dispo: f/u 3-4 months with PA.  Time spent: 40 minutes, of which greater than 50% was spent reviewing symptoms, relevant blood tests and studies, and discussing management plan with the patient.   Kate Sable, M.D.,  F.A.C.C.

## 2015-03-06 NOTE — Patient Instructions (Signed)
Your physician recommends that you continue on your current medications as directed. Please refer to the Current Medication list given to you today. Your physician recommends that you schedule a follow-up appointment in: 3-4 months with Ermalinda Barrios at the [place] office. You will receive a reminder letter in the mail in about 1-2 months reminding you to call and schedule your appointment. If you don't receive this letter, please contact our office.

## 2015-04-02 ENCOUNTER — Ambulatory Visit (INDEPENDENT_AMBULATORY_CARE_PROVIDER_SITE_OTHER): Payer: Medicare Other | Admitting: Internal Medicine

## 2015-04-02 ENCOUNTER — Other Ambulatory Visit (INDEPENDENT_AMBULATORY_CARE_PROVIDER_SITE_OTHER): Payer: Self-pay | Admitting: Internal Medicine

## 2015-04-02 ENCOUNTER — Encounter (INDEPENDENT_AMBULATORY_CARE_PROVIDER_SITE_OTHER): Payer: Self-pay | Admitting: *Deleted

## 2015-04-02 ENCOUNTER — Encounter (INDEPENDENT_AMBULATORY_CARE_PROVIDER_SITE_OTHER): Payer: Self-pay | Admitting: Internal Medicine

## 2015-04-02 VITALS — BP 144/58 | HR 64 | Temp 98.3°F | Ht 61.0 in | Wt 137.8 lb

## 2015-04-02 DIAGNOSIS — K921 Melena: Secondary | ICD-10-CM

## 2015-04-02 DIAGNOSIS — K922 Gastrointestinal hemorrhage, unspecified: Secondary | ICD-10-CM

## 2015-04-02 LAB — CBC WITH DIFFERENTIAL/PLATELET
BASOS PCT: 1 % (ref 0–1)
Basophils Absolute: 0.1 10*3/uL (ref 0.0–0.1)
EOS ABS: 0.5 10*3/uL (ref 0.0–0.7)
EOS PCT: 8 % — AB (ref 0–5)
HCT: 36.4 % (ref 36.0–46.0)
Hemoglobin: 11.1 g/dL — ABNORMAL LOW (ref 12.0–15.0)
Lymphocytes Relative: 10 % — ABNORMAL LOW (ref 12–46)
Lymphs Abs: 0.7 10*3/uL (ref 0.7–4.0)
MCH: 29.5 pg (ref 26.0–34.0)
MCHC: 30.5 g/dL (ref 30.0–36.0)
MCV: 96.8 fL (ref 78.0–100.0)
MONO ABS: 0.5 10*3/uL (ref 0.1–1.0)
MPV: 11.7 fL (ref 8.6–12.4)
Monocytes Relative: 7 % (ref 3–12)
NEUTROS ABS: 5 10*3/uL (ref 1.7–7.7)
Neutrophils Relative %: 74 % (ref 43–77)
Platelets: 293 10*3/uL (ref 150–400)
RBC: 3.76 MIL/uL — AB (ref 3.87–5.11)
RDW: 16.9 % — AB (ref 11.5–15.5)
WBC: 6.7 10*3/uL (ref 4.0–10.5)

## 2015-04-02 NOTE — Patient Instructions (Signed)
CBC today. OV in 6 months.  

## 2015-04-02 NOTE — Progress Notes (Signed)
Subjective:    Patient ID: Shawna [name], female    DOB: Sep 09, 1940, 74 y.o.   MRN: [id number]  HPI Here today for f/u. She was last seen in July for upper abdominal pain. She was 50% better when I saw her.  She tells me she is doing okay.  She sometimes has epigastric pain. She has diarrhea on occasion. She usually has a BM x 2 a week and normal size and consistency. She denies any melena. Hx of same and underwent an EGD in 2014 by Dr. Henrene Pastor At her last visit in July she did have 2 positive stool card. She was called but was a patient at [facility] at that time.  Her appetite is good. No weight loss. She rarely has acid reflux. No dysphagia.  Recent admission to [facility] in July. She presented to [facility] for angioplasy of fistulogram an apparently had a hypertensiver emergency and arrest. She was intubated.    03/26/2013 EGD and small bowel enteroscopy with control of bleeding (APC): melena Dr. Henrene Pastor: Non bleeding duodenal AVM status post APC. Incidental esophageal stricture.  Her last colonoscopy was in January 2013 by Dr. Britta Mccreedy.  Anemia, Hemocult +. Two polyps in the cecum, Sessile polyp in the descending colon, Anastomosis. Biopsy: All tubular adenoma. 11/10/2014 WBC 8.2, H and H 10.5 and 33.2 Hx of ESRD and takes dialysis M-W-F. Shunt in left upper arm. Dialysis x 5 yrs Hx ovarian cancer in 1992 Hx of cardiac stents x 4 per patient. Has been on [place] in the past CABG in 2013.    CBC    Component Value Date/Time   WBC 6.6 01/27/2015 0513   RBC 2.76* 01/27/2015 0513   HGB 8.3* 01/27/2015 0513   HCT 26.5* 01/27/2015 0513   PLT 282 01/27/2015 0513   MCV 96.0 01/27/2015 0513   MCH 30.1 01/27/2015 0513   MCHC 31.3 01/27/2015 0513   RDW 15.8* 01/27/2015 0513   LYMPHSABS 1.0 12/22/2014 0342   MONOABS 0.6 12/22/2014 0342   EOSABS 0.8* 12/22/2014 0342   BASOSABS 0.1 12/22/2014 0342       Review of Systems Past Medical History  Diagnosis Date  . Chronic bronchitis   . GERD  (gastroesophageal reflux disease)   . PUD (peptic ulcer disease)   . History of lower GI bleeding   . Arthritis   . History of gout   . CAD (coronary artery disease)     a. 12/2011 NSTEMI/Cath/PCI LCX (2.25x14 Resolute DES) & D1 (2.25x22 Resolute DES);  b. 01/2012 Cath/PCI: LM 30, LAD 30p, 40-20m, D1 stent ok, 99 in sm branch of diag, LCX patent stent, OM1 20, RCA 95 ost (4.0x12 Promus DES), EF 55%;  c. 04/2012 Lexi Cardiolite  EF 48%, small area of scar @ base/mid inflat wall with mild peri-infarct ischemia.; CABG 12/4  . High cholesterol 12/2011  . Pneumonia ~ 2009  . Iron deficiency anemia   . TIA (transient ischemic attack)   . Anxiety   . History of blood transfusion 07/2011; 12/2011; 01/2012 X 2; 04/2012  . Carotid artery disease     a. [id number] LICA, [id number]   . Mitral regurgitation     a. Moderate by echo, 02/2012  . Myocardial infarction   . Chronic diastolic CHF (congestive heart failure)     a. 02/2012 Echo EF 60-65%, nl wall motion, Gr 1 DD, mod MR  . Hypertension   . AVM (arteriovenous malformation) of colon   . Esophageal stricture   . Ovarian cancer  [place]  . ESRD on hemodialysis     ESRD due to HTN, started dialysis 2011 and gets HD at [facility] with Dr Hinda Lenis on MWF schedule.  Access is LUA AVF as of Sept 2014.     Past Surgical History  Procedure Laterality Date  . Abdominal hysterectomy  1992  . Appendectomy  06/1990  . Tubal ligation  1980's  . Av fistula placement  07/2009    left upper arm  . Thrombectomy / arteriovenous graft revision  2011    left upper arm  . Colon resection  1992  . Esophagogastroduodenoscopy  01/20/2012    Procedure: ESOPHAGOGASTRODUODENOSCOPY (EGD);  Surgeon: Ladene Artist, MD,FACG;  Location: [facility] ENDOSCOPY;  Service: Endoscopy;  Laterality: N/A;  . Dilation and curettage of uterus    . Coronary angioplasty with stent placement  12/15/11    "2"  . Coronary angioplasty with stent placement  y/2013    "1; makes total of  3" (05/02/2012)  . Coronary artery bypass graft  06/13/2012    Procedure: CORONARY ARTERY BYPASS GRAFTING (CABG);  Surgeon: Grace Isaac, MD;  Location: [place];  Service: Open Heart Surgery;  Laterality: N/A;  cabg x four;  using left internal mammary artery, and left leg greater saphenous vein harvested endoscopically  . Intraoperative transesophageal echocardiogram  06/13/2012    Procedure: INTRAOPERATIVE TRANSESOPHAGEAL ECHOCARDIOGRAM;  Surgeon: Grace Isaac, MD;  Location: [place];  Service: Open Heart Surgery;  Laterality: N/A;  . Esophagogastroduodenoscopy N/A 03/26/2013    Procedure: ESOPHAGOGASTRODUODENOSCOPY (EGD);  Surgeon: Irene Shipper, MD;  Location: [facility] ENDOSCOPY;  Service: Endoscopy;  Laterality: N/A;  . Ovary surgery      ovarian cancer  . Cardiac surgery    . Left heart catheterization with coronary angiogram N/A 12/15/2011    Procedure: LEFT HEART CATHETERIZATION WITH CORONARY ANGIOGRAM;  Surgeon: Burnell Blanks, MD;  Location: [facility] CATH LAB;  Service: Cardiovascular;  Laterality: N/A;  . Left heart catheterization with coronary angiogram N/A 01/10/2012    Procedure: LEFT HEART CATHETERIZATION WITH CORONARY ANGIOGRAM;  Surgeon: Peter M Martinique, MD;  Location: [facility] CATH LAB;  Service: Cardiovascular;  Laterality: N/A;  . Left heart catheterization with coronary angiogram N/A 06/08/2012    Procedure: LEFT HEART CATHETERIZATION WITH CORONARY ANGIOGRAM;  Surgeon: Burnell Blanks, MD;  Location: [facility] CATH LAB;  Service: Cardiovascular;  Laterality: N/A;  . Shuntogram N/A 10/15/2013    Procedure: Fistulogram;  Surgeon: Serafina Mitchell, MD;  Location: [facility] CATH LAB;  Service: Cardiovascular;  Laterality: N/A;  . Left heart catheterization with coronary/graft angiogram N/A 12/10/2013    Procedure: LEFT HEART CATHETERIZATION WITH Beatrix Fetters;  Surgeon: [name], MD;  Location: [facility] CATH LAB;  Service: Cardiovascular;  Laterality: N/A;    Allergies  Allergen  Reactions  . Aspirin Other (See Comments)    Mess up her stomach; "makes my bowels have blood in them". Takes 81 mg EC Aspirin   . Contrast Media [Iodinated Diagnostic Agents] Itching  . Iron Itching and Other (See Comments)    "they gave me iron in dialysis; had to give me Benadryl cause I had to have the iron" (05/02/2012)  . Macrodantin [Nitrofurantoin Macrocrystal] Other (See Comments)    "broke me out in big old knots all over my body; had to go to ER"  . Penicillins Other (See Comments)    "makes me real weak when I take it; like I'll pass out"  . Plavix [  Clopidogrel Bisulfate] Rash  . Amlodipine Swelling    Leg swelling  . Bactrim [Sulfamethoxazole-Trimethoprim] Rash  . Dexilant [Dexlansoprazole] Other (See Comments)    Upset stomach  . Morphine And Related Itching    Itching in feet  . Prilosec [Omeprazole] Other (See Comments)    "back spasms"  . Sulfa Antibiotics Rash  . Venofer [Ferric Oxide] Itching    Patient reports using Benadryl prior to doses as [facility]  . Levaquin [Levofloxacin In D5w] Rash  . Protonix [Pantoprazole Sodium] Rash    Current Outpatient Prescriptions on File Prior to Visit  Medication Sig Dispense Refill  . acetaminophen (TYLENOL) 325 MG tablet Take 2 tablets (650 mg total) by mouth every 6 (six) hours as needed for mild pain (mild pain).    Marland Kitchen ALPRAZolam (XANAX) 0.5 MG tablet Take 1 tablet (0.5 mg total) by mouth 3 (three) times daily. 30 tablet 1  . aspirin EC 81 MG tablet Take 81 mg by mouth daily.     . benzonatate (TESSALON PERLES) 100 MG capsule Take 1 capsule (100 mg total) by mouth 3 (three) times daily as needed for cough. 20 capsule 0  . cloNIDine (CATAPRES) 0.3 MG tablet Take 1 tablet (0.3 mg total) by mouth 2 (two) times daily. 60 tablet 6  . dextromethorphan-guaiFENesin (MUCINEX DM) 30-600 MG per 12 hr tablet Take 1 tablet by mouth daily. (Patient taking differently: Take 1 tablet by mouth 3 (three) times daily as needed. ) 14  tablet 1  . fluticasone (FLONASE) 50 MCG/ACT nasal spray Place 2 sprays into the nose daily as needed for allergies.     . folic acid-vitamin b complex-vitamin c-selenium-zinc (DIALYVITE) 3 MG TABS Take 1 tablet by mouth daily.    . hydrALAZINE (APRESOLINE) 50 MG tablet Take 1 tablet (50 mg total) by mouth 3 (three) times daily. 90 tablet 1  . isosorbide dinitrate (ISORDIL) 20 MG tablet Take 20 mg by mouth 3 (three) times daily.    Marland Kitchen labetalol (NORMODYNE) 200 MG tablet Take 200 mg by mouth 2 (two) times daily.    Marland Kitchen lanthanum (FOSRENOL) 1000 MG chewable tablet Chew 1,000 mg by mouth 3 (three) times daily after meals.    . lidocaine-prilocaine (EMLA) cream Apply 1 application topically daily as needed (for port access).     Marland Kitchen loratadine (CLARITIN) 10 MG tablet Take 10 mg by mouth daily.     . nitroGLYCERIN (NITROSTAT) 0.4 MG SL tablet Place 1 tablet (0.4 mg total) under the tongue every 5 (five) minutes x 3 doses as needed for chest pain. 25 tablet 3  . omeprazole (PRILOSEC) 20 MG capsule TAKE ONE CAPSULE BY MOUTH ONCE DAILY 30 capsule 0  . polyethylene glycol (MIRALAX / GLYCOLAX) packet Take 17 g by mouth daily. (Patient taking differently: Take 17 g by mouth daily as needed for mild constipation or moderate constipation. ) 14 each 0  . PROAIR HFA 108 (90 BASE) MCG/ACT inhaler Inhale 1 puff into the lungs every 6 (six) hours as needed for wheezing or shortness of breath.     . SENSIPAR 30 MG tablet Take 30 mg by mouth daily with supper.     . simvastatin (ZOCOR) 20 MG tablet Take 1 tablet (20 mg total) by mouth at bedtime. 30 tablet 3  . tetrahydrozoline 0.05 % ophthalmic solution Place 1 drop into both eyes 2 (two) times daily as needed (irritation).     No current facility-administered medications on file prior to visit.  Objective:   Physical Exam Blood pressure 144/58, pulse 64, temperature 98.3 F (36.8 C), height 5\' 1"  (1.549 m), weight 137 lb 12.8 oz (62.506 kg). Alert and  oriented. Skin warm and dry. Oral mucosa is moist.   . Sclera anicteric, conjunctivae is pink. Thyroid not enlarged. No cervical lymphadenopathy. Lungs clear. Heart regular rate and rhythm.  Abdomen is soft. Bowel sounds are positive. No hepatomegaly. No abdominal masses felt. No tenderness.  No edema to lower extremities.   Stool dark brown and guaiac positive.    Developer: [id number] ( 05/2018) Card: Lot [id number] (12/18)    Assessment & Plan:  Probably Upper GI bleed. Hx of melena. Hx of AVM. EGD with possible APC

## 2015-04-23 ENCOUNTER — Other Ambulatory Visit: Payer: Self-pay | Admitting: Gastroenterology

## 2015-04-30 ENCOUNTER — Encounter (HOSPITAL_COMMUNITY)
Admission: AD | Disposition: A | Discharge status: Home / Self Care | Payer: Self-pay | Source: Ambulatory Visit | Attending: Internal Medicine

## 2015-04-30 ENCOUNTER — Encounter (HOSPITAL_COMMUNITY): Payer: Self-pay | Admitting: *Deleted

## 2015-04-30 ENCOUNTER — Ambulatory Visit (HOSPITAL_COMMUNITY)
Admission: AD | Admit: 2015-04-30 | Discharge: 2015-04-30 | Disposition: A | Discharge status: Home / Self Care | Payer: Medicare Other | Source: Ambulatory Visit | Attending: Internal Medicine | Admitting: Internal Medicine

## 2015-04-30 DIAGNOSIS — N186 End stage renal disease: Secondary | ICD-10-CM | POA: Diagnosis present

## 2015-04-30 DIAGNOSIS — I132 Hypertensive heart and chronic kidney disease with heart failure and with stage 5 chronic kidney disease, or end stage renal disease: Principal | ICD-10-CM | POA: Diagnosis present

## 2015-04-30 DIAGNOSIS — Z833 Family history of diabetes mellitus: Secondary | ICD-10-CM

## 2015-04-30 DIAGNOSIS — I160 Hypertensive urgency: Secondary | ICD-10-CM | POA: Diagnosis present

## 2015-04-30 DIAGNOSIS — Z8249 Family history of ischemic heart disease and other diseases of the circulatory system: Secondary | ICD-10-CM

## 2015-04-30 DIAGNOSIS — K449 Diaphragmatic hernia without obstruction or gangrene: Secondary | ICD-10-CM | POA: Diagnosis present

## 2015-04-30 DIAGNOSIS — K31819 Angiodysplasia of stomach and duodenum without bleeding: Secondary | ICD-10-CM | POA: Diagnosis not present

## 2015-04-30 DIAGNOSIS — I2510 Atherosclerotic heart disease of native coronary artery without angina pectoris: Secondary | ICD-10-CM | POA: Diagnosis present

## 2015-04-30 DIAGNOSIS — Z8701 Personal history of pneumonia (recurrent): Secondary | ICD-10-CM

## 2015-04-30 DIAGNOSIS — R0602 Shortness of breath: Secondary | ICD-10-CM | POA: Diagnosis not present

## 2015-04-30 DIAGNOSIS — K922 Gastrointestinal hemorrhage, unspecified: Secondary | ICD-10-CM

## 2015-04-30 DIAGNOSIS — Z8543 Personal history of malignant neoplasm of ovary: Secondary | ICD-10-CM

## 2015-04-30 DIAGNOSIS — Z992 Dependence on renal dialysis: Secondary | ICD-10-CM

## 2015-04-30 DIAGNOSIS — I5033 Acute on chronic diastolic (congestive) heart failure: Secondary | ICD-10-CM | POA: Diagnosis present

## 2015-04-30 DIAGNOSIS — Z8673 Personal history of transient ischemic attack (TIA), and cerebral infarction without residual deficits: Secondary | ICD-10-CM

## 2015-04-30 DIAGNOSIS — E7800 Pure hypercholesterolemia, unspecified: Secondary | ICD-10-CM | POA: Diagnosis present

## 2015-04-30 DIAGNOSIS — K921 Melena: Secondary | ICD-10-CM

## 2015-04-30 DIAGNOSIS — K2990 Gastroduodenitis, unspecified, without bleeding: Secondary | ICD-10-CM

## 2015-04-30 DIAGNOSIS — D649 Anemia, unspecified: Secondary | ICD-10-CM | POA: Diagnosis present

## 2015-04-30 DIAGNOSIS — Z951 Presence of aortocoronary bypass graft: Secondary | ICD-10-CM

## 2015-04-30 DIAGNOSIS — K2980 Duodenitis without bleeding: Secondary | ICD-10-CM | POA: Diagnosis not present

## 2015-04-30 DIAGNOSIS — I252 Old myocardial infarction: Secondary | ICD-10-CM

## 2015-04-30 DIAGNOSIS — M1990 Unspecified osteoarthritis, unspecified site: Secondary | ICD-10-CM | POA: Diagnosis present

## 2015-04-30 DIAGNOSIS — Z85038 Personal history of other malignant neoplasm of large intestine: Secondary | ICD-10-CM

## 2015-04-30 DIAGNOSIS — K210 Gastro-esophageal reflux disease with esophagitis: Secondary | ICD-10-CM | POA: Diagnosis not present

## 2015-04-30 DIAGNOSIS — E785 Hyperlipidemia, unspecified: Secondary | ICD-10-CM | POA: Diagnosis present

## 2015-04-30 HISTORY — PX: ESOPHAGOGASTRODUODENOSCOPY: SHX5428

## 2015-04-30 SURGERY — EGD (ESOPHAGOGASTRODUODENOSCOPY)
Anesthesia: Moderate Sedation

## 2015-04-30 MED ORDER — STERILE WATER FOR IRRIGATION IR SOLN
Status: DC | PRN
  Administered 2015-04-30: 14:00:00

## 2015-04-30 MED ORDER — MIDAZOLAM HCL 5 MG/5ML IJ SOLN
INTRAMUSCULAR | Status: AC
  Filled 2015-04-30: qty 10

## 2015-04-30 MED ORDER — MIDAZOLAM HCL 5 MG/5ML IJ SOLN
INTRAMUSCULAR | Status: DC | PRN
  Administered 2015-04-30: 2 mg via INTRAVENOUS
  Administered 2015-04-30 (×3): 1 mg via INTRAVENOUS

## 2015-04-30 MED ORDER — MEPERIDINE HCL 50 MG/ML IJ SOLN
INTRAMUSCULAR | Status: DC | PRN
  Administered 2015-04-30 (×2): 25 mg via INTRAVENOUS

## 2015-04-30 MED ORDER — SODIUM CHLORIDE 0.9 % IV SOLN
INTRAVENOUS | Status: DC
  Administered 2015-04-30: 13:00:00 via INTRAVENOUS

## 2015-04-30 MED ORDER — MEPERIDINE HCL 50 MG/ML IJ SOLN
INTRAMUSCULAR | Status: AC
  Filled 2015-04-30: qty 1

## 2015-04-30 NOTE — H&P (Signed)
Shawna [name] is an 74 y.o. female.   Chief Complaint: Patient is here for EGD. HPI: Patient is 74 year old African female with multiple medical problems who presents with a few day history of melena. She also gives history of generalized abdominal pain few days ago. She is had nausea but no vomiting. She denies rectal bleeding. She has history of upper GI bleed secondary to duodenal AV malformations treated endoscopically by Dr. Scarlette [name] in September 2014. Patient is on low-dose aspirin but does not take other NSAIDs. She was recent seen in the office in her stool was heme positive. H&H 4 weeks ago was 11.1 and 36.4.  Past Medical History  Diagnosis Date  . Chronic bronchitis ([place])   . GERD (gastroesophageal reflux disease)   . PUD (peptic ulcer disease)   . History of lower GI bleeding   . Arthritis   . History of gout   . CAD (coronary artery disease)     a. 12/2011 NSTEMI/Cath/PCI LCX (2.25x14 Resolute DES) & D1 (2.25x22 Resolute DES);  b. 01/2012 Cath/PCI: LM 30, LAD 30p, 40-65m, D1 stent ok, 99 in sm branch of diag, LCX patent stent, OM1 20, RCA 95 ost (4.0x12 Promus DES), EF 55%;  c. 04/2012 Lexi Cardiolite  EF 48%, small area of scar @ base/mid inflat wall with mild peri-infarct ischemia.; CABG 12/4  . High cholesterol 12/2011  . Pneumonia ~ 2009  . Iron deficiency anemia   . TIA (transient ischemic attack)   . Anxiety   . History of blood transfusion 07/2011; 12/2011; 01/2012 X 2; 04/2012  . Carotid artery disease ([place])     a. [id number] LICA, [id number]   . Mitral regurgitation     a. Moderate by echo, 02/2012  . Myocardial infarction ([place])   . Chronic diastolic CHF (congestive heart failure) ([place])     a. 02/2012 Echo EF 60-65%, nl wall motion, Gr 1 DD, mod MR  . Hypertension   . AVM (arteriovenous malformation) of colon   . Esophageal stricture   . Ovarian cancer ([place]) 1992  . Colon cancer ([place]) 1992  . ESRD on hemodialysis ([place])     ESRD due to HTN, started dialysis 2011 and gets HD  at [facility] with Dr Hinda Lenis on MWF schedule.  Access is LUA AVF as of Sept 2014.     Past Surgical History  Procedure Laterality Date  . Abdominal hysterectomy  1992  . Appendectomy  06/1990  . Tubal ligation  1980's  . Av fistula placement  07/2009    left upper arm  . Thrombectomy / arteriovenous graft revision  2011    left upper arm  . Colon resection  1992  . Esophagogastroduodenoscopy  01/20/2012    Procedure: ESOPHAGOGASTRODUODENOSCOPY (EGD);  Surgeon: Shawna [name],FACG;  Location: [facility] ENDOSCOPY;  Service: Endoscopy;  Laterality: N/A;  . Dilation and curettage of uterus    . Coronary angioplasty with stent placement  12/15/11    "2"  . Coronary angioplasty with stent placement  y/2013    "1; makes total of 3" (05/02/2012)  . Coronary artery bypass graft  06/13/2012    Procedure: CORONARY ARTERY BYPASS GRAFTING (CABG);  Surgeon: Shawna [name];  Location: [place];  Service: Open Heart Surgery;  Laterality: N/A;  cabg x four;  using left internal mammary artery, and left leg greater saphenous vein harvested endoscopically  . Intraoperative transesophageal echocardiogram  06/13/2012    Procedure: INTRAOPERATIVE TRANSESOPHAGEAL ECHOCARDIOGRAM;  Surgeon: Shawna [name];  Location: [facility] OR;  Service: Open Heart Surgery;  Laterality: N/A;  . Esophagogastroduodenoscopy N/A 03/26/2013    Procedure: ESOPHAGOGASTRODUODENOSCOPY (EGD);  Surgeon: Shawna [name];  Location: [facility] ENDOSCOPY;  Service: Endoscopy;  Laterality: N/A;  . Ovary surgery      ovarian cancer  . Cardiac surgery    . Left heart catheterization with coronary angiogram N/A 12/15/2011    Procedure: LEFT HEART CATHETERIZATION WITH CORONARY ANGIOGRAM;  Surgeon: Shawna [name];  Location: [facility] CATH LAB;  Service: Cardiovascular;  Laterality: N/A;  . Left heart catheterization with coronary angiogram N/A 01/10/2012    Procedure: LEFT HEART CATHETERIZATION WITH CORONARY ANGIOGRAM;  Surgeon: Shawna [name];   Location: [facility] CATH LAB;  Service: Cardiovascular;  Laterality: N/A;  . Left heart catheterization with coronary angiogram N/A 06/08/2012    Procedure: LEFT HEART CATHETERIZATION WITH CORONARY ANGIOGRAM;  Surgeon: Shawna [name];  Location: [facility] CATH LAB;  Service: Cardiovascular;  Laterality: N/A;  . Shuntogram N/A 10/15/2013    Procedure: Fistulogram;  Surgeon: Shawna [name];  Location: [facility] CATH LAB;  Service: Cardiovascular;  Laterality: N/A;  . Left heart catheterization with coronary/graft angiogram N/A 12/10/2013    Procedure: LEFT HEART CATHETERIZATION WITH Shawna [name];  Surgeon: Shawna [name];  Location: [facility] CATH LAB;  Service: Cardiovascular;  Laterality: N/A;    Family History  Problem Relation Age of Onset  . Other      noncontributory for early CAD  . Heart disease Mother     Heart Disease before age 53  . Hyperlipidemia Mother   . Hypertension Mother   . Diabetes Mother   . Heart attack Mother   . Heart disease Father     Heart Disease before age 41  . Hyperlipidemia Father   . Hypertension Father   . Diabetes Father   . Diabetes Sister   . Hypertension Sister   . Diabetes Brother   . Hyperlipidemia Brother   . Heart attack Brother   . Colon cancer Neg Hx   . Esophageal cancer Neg Hx   . Liver disease Neg Hx   . Kidney disease Neg Hx   . Colon polyps Neg Hx   . Hypertension Sister   . Heart attack Brother    Social History:  reports that she has never smoked. She has never used smokeless tobacco. She reports that she does not drink alcohol or use illicit drugs.  Allergies:  Allergies  Allergen Reactions  . Aspirin Other (See Comments)    Mess up her stomach; "makes my bowels have blood in them". Takes 81 mg EC Aspirin   . Contrast Media [Iodinated Diagnostic Agents] Itching  . Iron Itching and Other (See Comments)    "they gave me iron in dialysis; had to give me Benadryl cause I had to have the iron" (05/02/2012)  .  Macrodantin [Nitrofurantoin Macrocrystal] Other (See Comments)    "broke me out in big old knots all over my body; had to go to ER"  . Penicillins Other (See Comments)    "makes me real weak when I take it; like I'll pass out"  . Plavix [Clopidogrel Bisulfate] Rash  . Amlodipine Swelling    Leg swelling  . Bactrim [Sulfamethoxazole-Trimethoprim] Rash  . Dexilant [Dexlansoprazole] Other (See Comments)    Upset stomach  . Morphine And Related Itching    Itching in feet  . Prilosec [Omeprazole] Other (See Comments)    "back spasms"  . Sulfa Antibiotics Rash  .  Venofer [Ferric Oxide] Itching    Patient reports using Benadryl prior to doses as [facility]  . Levaquin [Levofloxacin In D5w] Rash  . Protonix [Pantoprazole Sodium] Rash    Medications Prior to Admission  Medication Sig Dispense Refill  . ALPRAZolam (XANAX) 0.5 MG tablet Take 1 tablet (0.5 mg total) by mouth 3 (three) times daily. 30 tablet 1  . aspirin EC 81 MG tablet Take 81 mg by mouth daily.     . cloNIDine (CATAPRES) 0.3 MG tablet Take 1 tablet (0.3 mg total) by mouth 2 (two) times daily. 60 tablet 6  . fluticasone (FLONASE) 50 MCG/ACT nasal spray Place 2 sprays into the nose daily as needed for allergies.     . folic acid-vitamin b complex-vitamin c-selenium-zinc (DIALYVITE) 3 MG TABS Take 1 tablet by mouth daily.    . hydrALAZINE (APRESOLINE) 50 MG tablet Take 1 tablet (50 mg total) by mouth 3 (three) times daily. 90 tablet 1  . isosorbide dinitrate (ISORDIL) 20 MG tablet Take 20 mg by mouth 3 (three) times daily.    Marland Kitchen labetalol (NORMODYNE) 200 MG tablet Take 200 mg by mouth 2 (two) times daily.    Marland Kitchen lanthanum (FOSRENOL) 1000 MG chewable tablet Chew 1,000 mg by mouth 3 (three) times daily after meals.    . lidocaine-prilocaine (EMLA) cream Apply 1 application topically daily as needed (for port access).     Marland Kitchen loratadine (CLARITIN) 10 MG tablet Take 10 mg by mouth daily.     Marland Kitchen omeprazole (PRILOSEC) 20 MG capsule TAKE  ONE CAPSULE BY MOUTH ONCE DAILY 30 capsule 0  . polyethylene glycol (MIRALAX / GLYCOLAX) packet Take 17 g by mouth daily. (Patient taking differently: Take 17 g by mouth daily as needed for mild constipation or moderate constipation. ) 14 each 0  . SENSIPAR 30 MG tablet Take 30 mg by mouth daily with supper.     . simvastatin (ZOCOR) 20 MG tablet Take 1 tablet (20 mg total) by mouth at bedtime. 30 tablet 3  . tetrahydrozoline 0.05 % ophthalmic solution Place 1 drop into both eyes 2 (two) times daily as needed (irritation).    Marland Kitchen acetaminophen (TYLENOL) 325 MG tablet Take 2 tablets (650 mg total) by mouth every 6 (six) hours as needed for mild pain (mild pain).    . benzonatate (TESSALON PERLES) 100 MG capsule Take 1 capsule (100 mg total) by mouth 3 (three) times daily as needed for cough. 20 capsule 0  . dextromethorphan-guaiFENesin (MUCINEX DM) 30-600 MG per 12 hr tablet Take 1 tablet by mouth daily. (Patient taking differently: Take 1 tablet by mouth 3 (three) times daily as needed. ) 14 tablet 1  . nitroGLYCERIN (NITROSTAT) 0.4 MG SL tablet Place 1 tablet (0.4 mg total) under the tongue every 5 (five) minutes x 3 doses as needed for chest pain. 25 tablet 3  . PROAIR HFA 108 (90 BASE) MCG/ACT inhaler Inhale 1 puff into the lungs every 6 (six) hours as needed for wheezing or shortness of breath.       No results found for this or any previous visit (from the past 48 hour(s)). No results found.  ROS  Blood pressure 195/80, pulse 71, temperature 98.6 F (37 C), temperature source Oral, resp. rate 19, height 5\' 1"  (1.549 m), weight 137 lb (62.143 kg), SpO2 98 %. Physical Exam  Constitutional: She appears well-developed and well-nourished.  HENT:  Mouth/Throat: Oropharynx is clear and moist.  Eyes: Conjunctivae are normal. No scleral icterus.  Neck: No thyromegaly present.  Cardiovascular: Normal rate, regular rhythm and normal heart sounds.   No murmur heard. Respiratory: Effort normal and  breath sounds normal.  GI: Soft. She exhibits no distension. Tenderness: mild tenderness at RLQ.  Musculoskeletal: She exhibits no edema.  Lymphadenopathy:    She has no cervical adenopathy.  Neurological: She is alert.  Skin: Skin is warm and dry.     Assessment/Plan Melena and abdominal pain. History of upper GI bleed secondary to duodenal AV malformation. EGD with therapeutic intervention.  Burleigh Brockmann U 04/30/2015, 1:38 PM

## 2015-04-30 NOTE — Discharge Instructions (Signed)
Resume usual medications and diet. No driving for 24 hours. Physician will call with results of blood tests.  Esophagogastroduodenoscopy, Care After Refer to this sheet in the next few weeks. These instructions provide you with information about caring for yourself after your procedure. Your health care provider may also give you more specific instructions. Your treatment has been planned according to current medical practices, but problems sometimes occur. Call your health care provider if you have any problems or questions after your procedure. WHAT TO EXPECT AFTER THE PROCEDURE After your procedure, it is typical to feel:  Soreness in your throat.  Pain with swallowing.  Sick to your stomach (nauseous).  Bloated.  Dizzy.  Fatigued. HOME CARE INSTRUCTIONS  Do not eat or drink anything until the numbing medicine (local anesthetic) has worn off and your gag reflex has returned. You will know that the local anesthetic has worn off when you can swallow comfortably.  Do not drive or operate machinery until directed by your health care provider.  Take medicines only as directed by your health care provider. SEEK MEDICAL CARE IF:   You cannot stop coughing.  You are not urinating at all or less than usual. SEEK IMMEDIATE MEDICAL CARE IF:  You have difficulty swallowing.  You cannot eat or drink.  You have worsening throat or chest pain.  You have dizziness or lightheadedness or you faint.  You have nausea or vomiting.  You have chills.  You have a fever.  You have severe abdominal pain.  You have black, tarry, or bloody stools.   This information is not intended to replace advice given to you by your health care provider. Make sure you discuss any questions you have with your health care provider.   Document Released: 06/13/2012 Document Revised: 07/18/2014 Document Reviewed: 06/13/2012 Elsevier Interactive Patient Education [employer].

## 2015-04-30 NOTE — Op Note (Signed)
EGD PROCEDURE REPORT  PATIENT:  Shawna [name]  MR#:  [id number] Birthdate:  [birth year], 74 y.o., female Endoscopist:  Dr. Rogene Houston, MD Referred By:  Dr. [name], MD Procedure Date: 04/30/2015  Procedure:   EGD  Indications:  Patient is 74 year old African-American female with multiple medical problems who presents with abdominal pain and melena. She has history of bleeding from duodenal AV malformations about 2 years ago. She is on low-dose aspirin.            Informed Consent:  The risks, benefits, alternatives & imponderables which include, but are not limited to, bleeding, infection, perforation, drug reaction and potential missed lesion have been reviewed.  The potential for biopsy, lesion removal, esophageal dilation, etc. have also been discussed.  Questions have been answered.  All parties agreeable.  Please see history & physical in medical record for more information.  Medications:  Demerol 50 mg IV Versed 5 mg IV Cetacaine spray topically for oropharyngeal anesthesia  Description of procedure:  The endoscope was introduced through the mouth and advanced to the second portion of the duodenum without difficulty or limitations. The mucosal surfaces were surveyed very carefully during advancement of the scope and upon withdrawal.  Findings:  Esophagus:  Mucosa of the esophagus was normal. Focal edema and erythema noted to GE junction but no ring or stricture present. GEJ:  35 cm Hiatus:  37 cm Stomach:  Stomach was empty and distended very well with insufflation. Folds in the proximal stomach were normal. Examination mucosa gastric body was normal. Few linear telangiectasia noted at antrum without stigmata of bleeding. Erosion noted and prepyloric region with erythema and friability to mucosa at pyloric channel. Pyloric channel was patent. Angularis fundus and cardia were unremarkable. Duodenum:  Patchy erythema and erosions noted to duodenal mucosa. Post bulbar duodenal  mucosa was normal.  Therapeutic/Diagnostic Maneuvers Performed:  None  Complications:  None  EBL: None  Impression: Small sliding hiatal hernia with mild changes of reflux esophagitis limited to GE junction. Erosive gastroduodenitis but no evidence of peptic ulcer disease. Mild antral telangiectasia without stigmata of bleed.  Recommendations:  Continue anti-reflux measures and omeprazole. Will check H&H and H. pylori serology. I will be contacting patient with results of these studies. If her hemoglobin has dropped significantly from a baseline would consider further evaluation.  Piya Mesch U  04/30/2015  2:09 PM  CC: Dr. [name], MD & Dr. Rayne Du ref. provider found

## 2015-05-01 LAB — H. PYLORI ANTIBODY, IGG

## 2015-05-03 ENCOUNTER — Other Ambulatory Visit: Payer: Self-pay

## 2015-05-03 ENCOUNTER — Emergency Department (HOSPITAL_COMMUNITY): Payer: Medicare Other

## 2015-05-03 ENCOUNTER — Encounter (HOSPITAL_COMMUNITY): Payer: Self-pay

## 2015-05-03 ENCOUNTER — Inpatient Hospital Stay (HOSPITAL_COMMUNITY)
Admission: EM | Admit: 2015-05-03 | Discharge: 2015-05-05 | DRG: 291 | Disposition: A | Discharge status: Home / Self Care | Payer: Medicare Other | Attending: Internal Medicine | Admitting: Internal Medicine

## 2015-05-03 DIAGNOSIS — Z8543 Personal history of malignant neoplasm of ovary: Secondary | ICD-10-CM | POA: Diagnosis not present

## 2015-05-03 DIAGNOSIS — R0602 Shortness of breath: Secondary | ICD-10-CM | POA: Diagnosis present

## 2015-05-03 DIAGNOSIS — Z85038 Personal history of other malignant neoplasm of large intestine: Secondary | ICD-10-CM | POA: Diagnosis not present

## 2015-05-03 DIAGNOSIS — Z992 Dependence on renal dialysis: Secondary | ICD-10-CM | POA: Diagnosis not present

## 2015-05-03 DIAGNOSIS — Z951 Presence of aortocoronary bypass graft: Secondary | ICD-10-CM | POA: Diagnosis not present

## 2015-05-03 DIAGNOSIS — Z8249 Family history of ischemic heart disease and other diseases of the circulatory system: Secondary | ICD-10-CM | POA: Diagnosis not present

## 2015-05-03 DIAGNOSIS — I5033 Acute on chronic diastolic (congestive) heart failure: Secondary | ICD-10-CM | POA: Diagnosis present

## 2015-05-03 DIAGNOSIS — R0789 Other chest pain: Secondary | ICD-10-CM | POA: Diagnosis not present

## 2015-05-03 DIAGNOSIS — I10 Essential (primary) hypertension: Secondary | ICD-10-CM | POA: Diagnosis not present

## 2015-05-03 DIAGNOSIS — N186 End stage renal disease: Secondary | ICD-10-CM | POA: Diagnosis present

## 2015-05-03 DIAGNOSIS — I209 Angina pectoris, unspecified: Secondary | ICD-10-CM | POA: Diagnosis not present

## 2015-05-03 DIAGNOSIS — Z8701 Personal history of pneumonia (recurrent): Secondary | ICD-10-CM | POA: Diagnosis not present

## 2015-05-03 DIAGNOSIS — I25119 Atherosclerotic heart disease of native coronary artery with unspecified angina pectoris: Secondary | ICD-10-CM

## 2015-05-03 DIAGNOSIS — E785 Hyperlipidemia, unspecified: Secondary | ICD-10-CM | POA: Diagnosis present

## 2015-05-03 DIAGNOSIS — Z833 Family history of diabetes mellitus: Secondary | ICD-10-CM | POA: Diagnosis not present

## 2015-05-03 DIAGNOSIS — K449 Diaphragmatic hernia without obstruction or gangrene: Secondary | ICD-10-CM

## 2015-05-03 DIAGNOSIS — I160 Hypertensive urgency: Secondary | ICD-10-CM | POA: Diagnosis present

## 2015-05-03 DIAGNOSIS — M1990 Unspecified osteoarthritis, unspecified site: Secondary | ICD-10-CM | POA: Diagnosis present

## 2015-05-03 DIAGNOSIS — R079 Chest pain, unspecified: Secondary | ICD-10-CM | POA: Diagnosis not present

## 2015-05-03 DIAGNOSIS — E782 Mixed hyperlipidemia: Secondary | ICD-10-CM | POA: Diagnosis present

## 2015-05-03 DIAGNOSIS — I132 Hypertensive heart and chronic kidney disease with heart failure and with stage 5 chronic kidney disease, or end stage renal disease: Secondary | ICD-10-CM | POA: Diagnosis present

## 2015-05-03 DIAGNOSIS — E7800 Pure hypercholesterolemia, unspecified: Secondary | ICD-10-CM | POA: Diagnosis present

## 2015-05-03 DIAGNOSIS — I2510 Atherosclerotic heart disease of native coronary artery without angina pectoris: Secondary | ICD-10-CM | POA: Diagnosis present

## 2015-05-03 DIAGNOSIS — I200 Unstable angina: Secondary | ICD-10-CM | POA: Diagnosis present

## 2015-05-03 DIAGNOSIS — D649 Anemia, unspecified: Secondary | ICD-10-CM | POA: Diagnosis present

## 2015-05-03 DIAGNOSIS — K210 Gastro-esophageal reflux disease with esophagitis: Secondary | ICD-10-CM | POA: Diagnosis present

## 2015-05-03 DIAGNOSIS — I252 Old myocardial infarction: Secondary | ICD-10-CM | POA: Diagnosis not present

## 2015-05-03 DIAGNOSIS — Z8673 Personal history of transient ischemic attack (TIA), and cerebral infarction without residual deficits: Secondary | ICD-10-CM | POA: Diagnosis not present

## 2015-05-03 LAB — COMPREHENSIVE METABOLIC PANEL
ALBUMIN: 3.6 g/dL (ref 3.5–5.0)
ALK PHOS: 78 U/L (ref 38–126)
ALT: 17 U/L (ref 14–54)
ANION GAP: 13 (ref 5–15)
AST: 18 U/L (ref 15–41)
BILIRUBIN TOTAL: 0.6 mg/dL (ref 0.3–1.2)
BUN: 46 mg/dL — ABNORMAL HIGH (ref 6–20)
CALCIUM: 9.3 mg/dL (ref 8.9–10.3)
CO2: 27 mmol/L (ref 22–32)
Chloride: 99 mmol/L — ABNORMAL LOW (ref 101–111)
Creatinine, Ser: 9.96 mg/dL — ABNORMAL HIGH (ref 0.44–1.00)
GFR calc Af Amer: 4 mL/min — ABNORMAL LOW (ref >60)
GFR calc non Af Amer: 3 mL/min — ABNORMAL LOW (ref >60)
GLUCOSE: 120 mg/dL — AB (ref 65–99)
Potassium: 5.1 mmol/L (ref 3.5–5.1)
Sodium: 139 mmol/L (ref 135–145)
TOTAL PROTEIN: 6.6 g/dL (ref 6.5–8.1)

## 2015-05-03 LAB — CBC
HEMATOCRIT: 35.6 % — AB (ref 36.0–46.0)
HEMOGLOBIN: 10.7 g/dL — AB (ref 12.0–15.0)
MCH: 29 pg (ref 26.0–34.0)
MCHC: 30.1 g/dL (ref 30.0–36.0)
MCV: 96.5 fL (ref 78.0–100.0)
Platelets: 276 10*3/uL (ref 150–400)
RBC: 3.69 MIL/uL — ABNORMAL LOW (ref 3.87–5.11)
RDW: 18.1 % — AB (ref 11.5–15.5)
WBC: 7.2 10*3/uL (ref 4.0–10.5)

## 2015-05-03 LAB — PROTIME-INR
INR: 1.15 (ref 0.00–1.49)
PROTHROMBIN TIME: 14.9 s (ref 11.6–15.2)

## 2015-05-03 LAB — I-STAT TROPONIN, ED
TROPONIN I, POC: 0.02 ng/mL (ref 0.00–0.08)
Troponin i, poc: 0.01 ng/mL (ref 0.00–0.08)

## 2015-05-03 LAB — APTT: APTT: 35 s (ref 24–37)

## 2015-05-03 IMAGING — DX DG CHEST 2V
2 series · 2 of 2 positions shown · non-contrast
Comparison: [DATE] and prior radiographs

CLINICAL DATA: 74-year-old female with acute chest pain. Patient on
dialysis and history of CABG.

EXAM:
CHEST  2 VIEW

[chest pa]
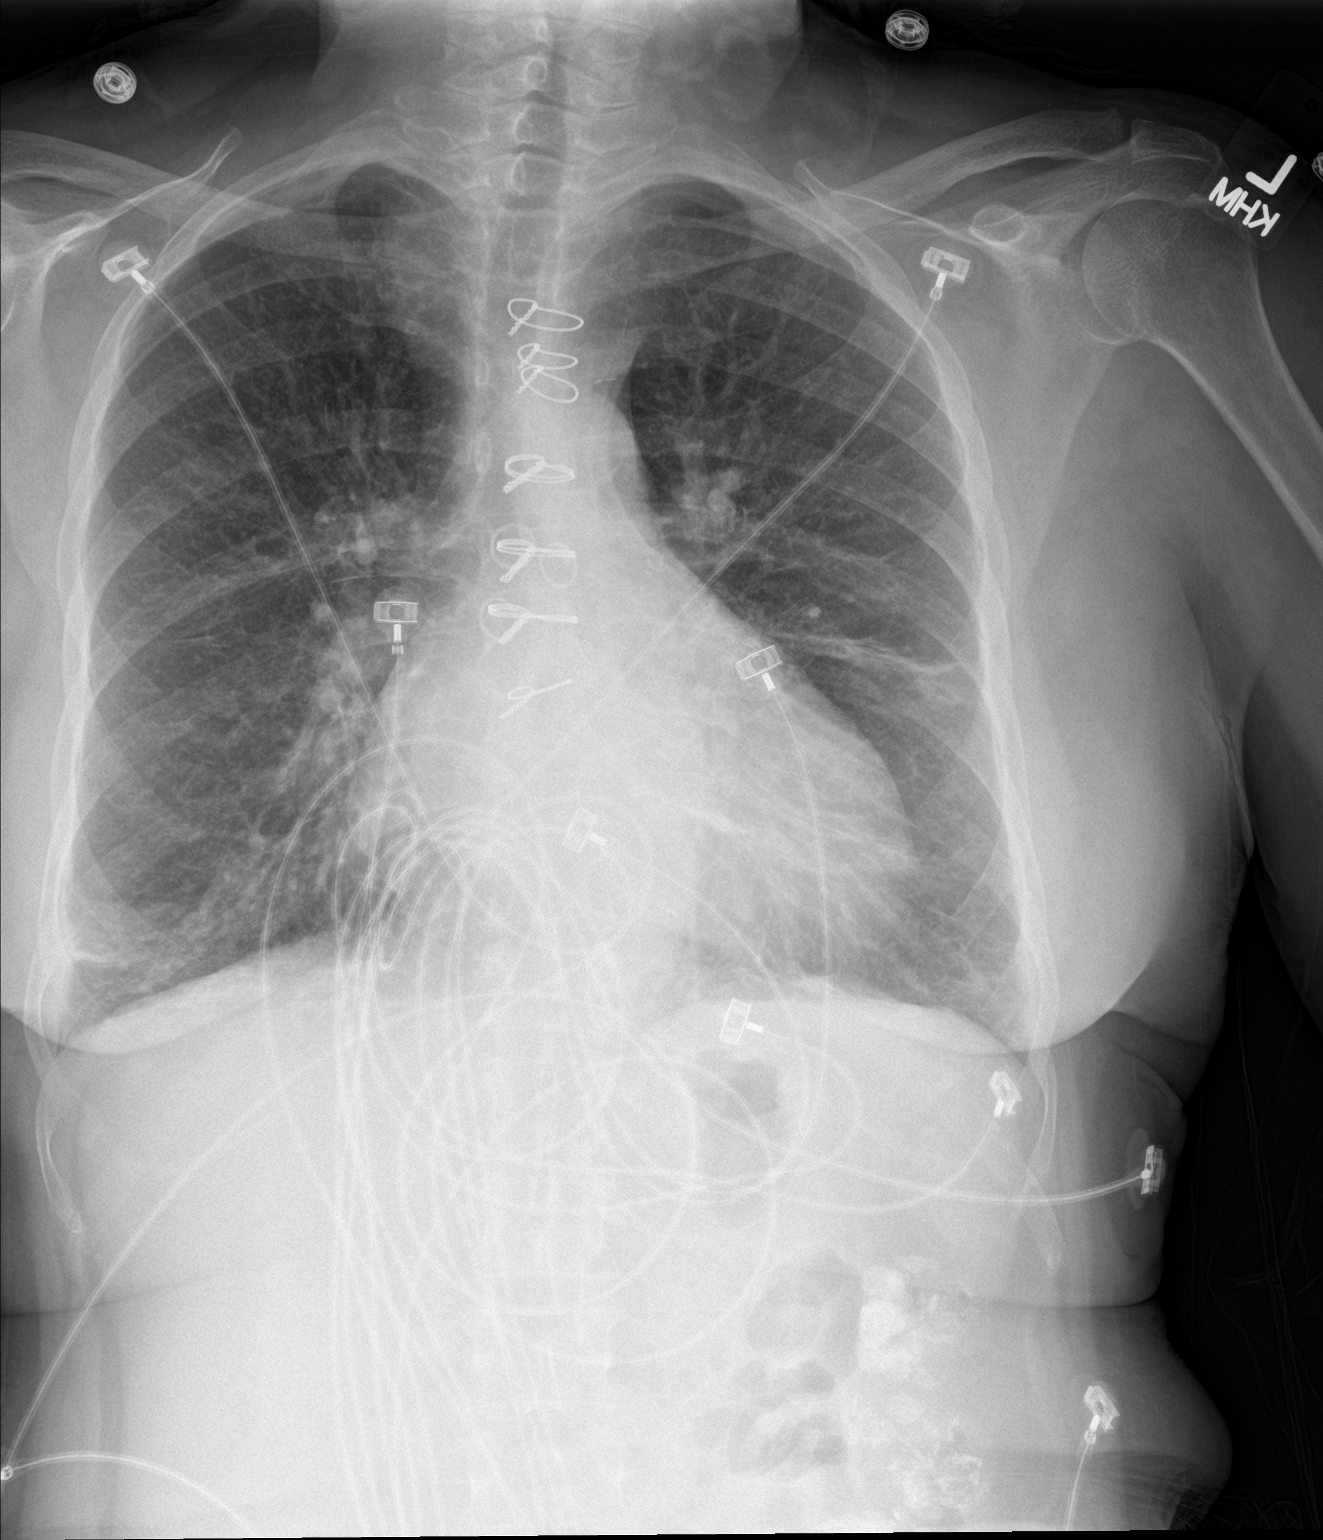

[chest lat]
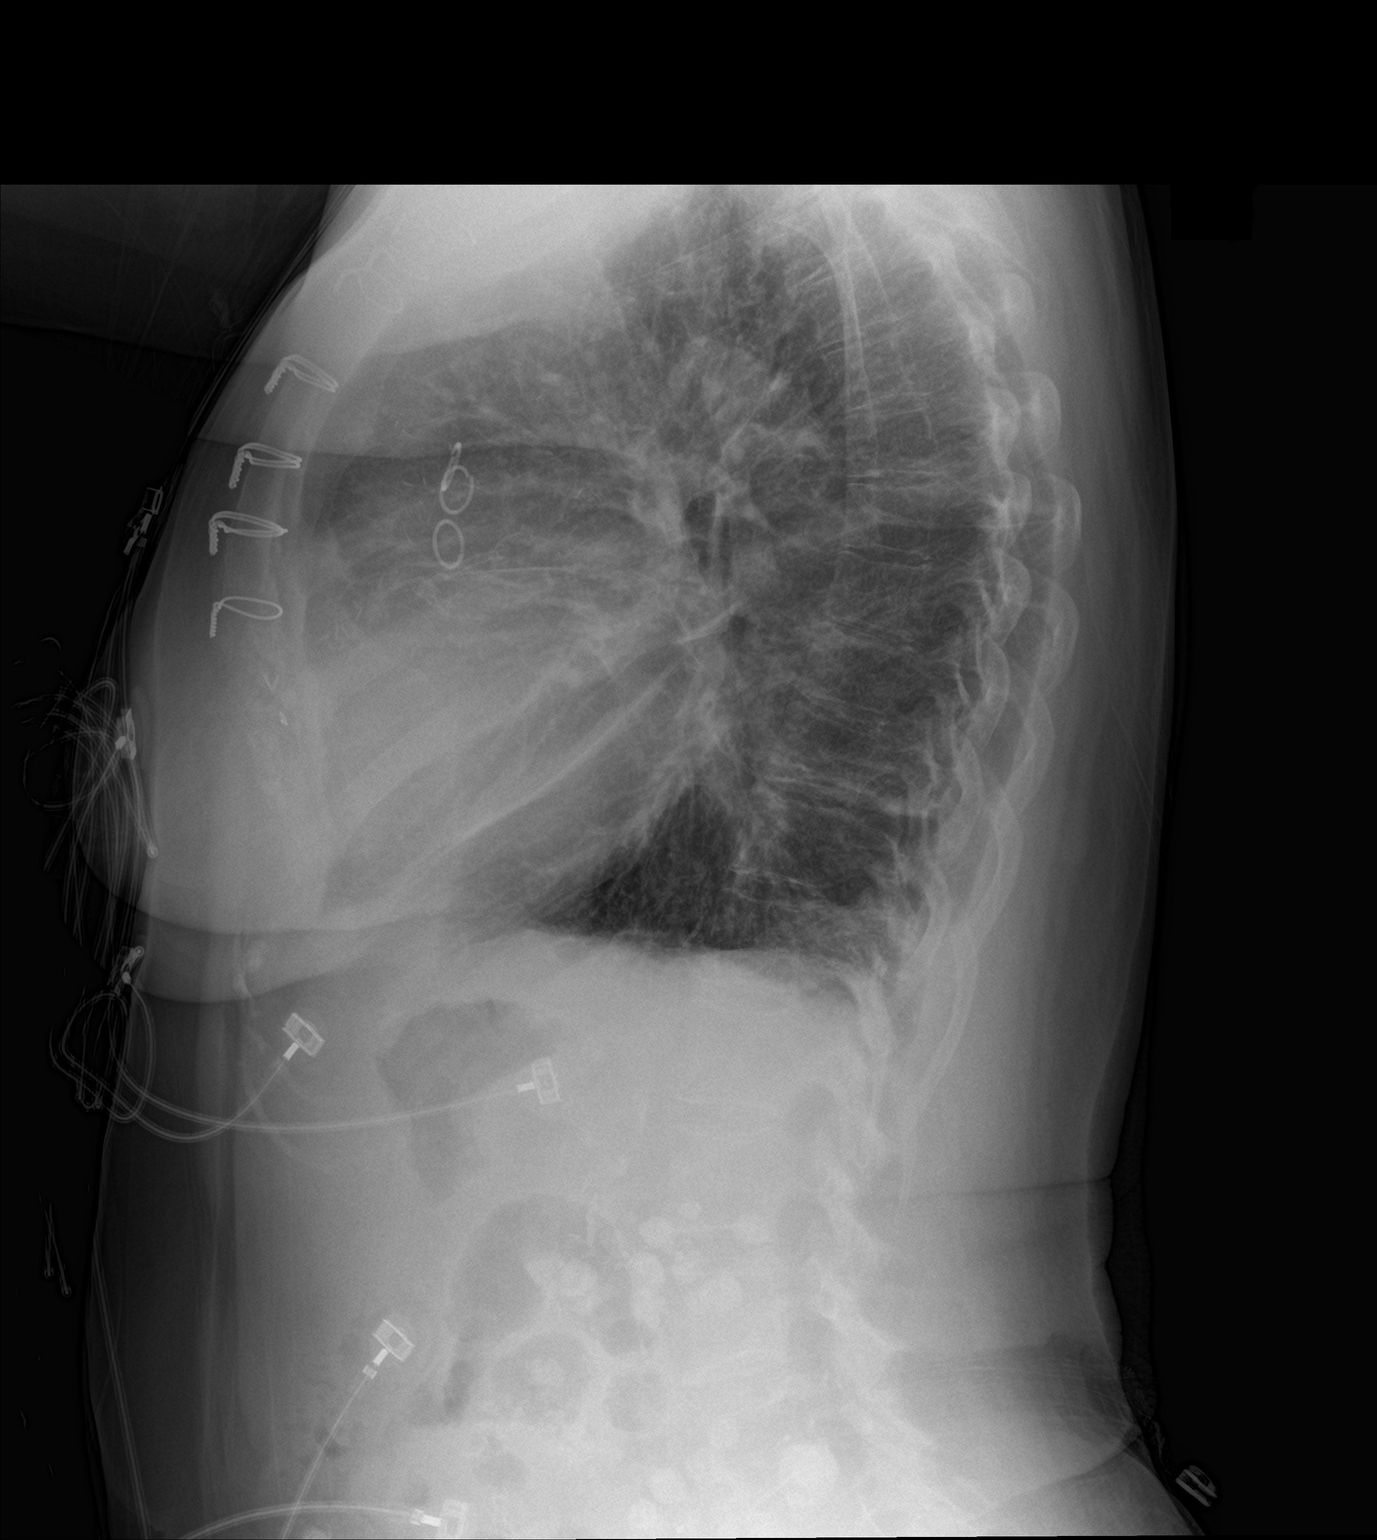

[2 of 2 positions shown; findings below may reference images not displayed]

FINDINGS: Cardiomegaly and CABG changes again noted.

Pulmonary vascular congestion noted. New interstitial opacities
likely represent mild interstitial edema.

Trace bilateral pleural effusions noted.

There is no evidence of pneumothorax or acute bony abnormality.
IMPRESSION: Cardiomegaly with pulmonary vascular congestion, probable mild
interstitial edema and trace bilateral pleural effusions.

## 2015-05-03 MED ORDER — POLYETHYLENE GLYCOL 3350 17 G PO PACK: 17.0000 g | PACK | Freq: Every day | ORAL | Status: DC | PRN

## 2015-05-03 MED ORDER — NITROGLYCERIN IN D5W 200-5 MCG/ML-% IV SOLN
0.0000 ug/min | INTRAVENOUS | Status: DC
  Administered 2015-05-03: 10 ug/min via INTRAVENOUS
  Administered 2015-05-04: 85 ug/min via INTRAVENOUS
  Filled 2015-05-03: qty 250

## 2015-05-03 MED ORDER — ALBUTEROL SULFATE HFA 108 (90 BASE) MCG/ACT IN AERS
1.0000 | INHALATION_SPRAY | Freq: Four times a day (QID) | RESPIRATORY_TRACT | Status: DC | PRN
  Filled 2015-05-03: qty 6.7

## 2015-05-03 MED ORDER — ACETAMINOPHEN 325 MG PO TABS
650.0000 mg | ORAL_TABLET | Freq: Four times a day (QID) | ORAL | Status: DC | PRN
  Administered 2015-05-04: 650 mg via ORAL
  Filled 2015-05-03: qty 2

## 2015-05-03 MED ORDER — HYDRALAZINE HCL 25 MG PO TABS
ORAL_TABLET | ORAL | Status: AC
  Filled 2015-05-03: qty 2

## 2015-05-03 MED ORDER — BENZONATATE 100 MG PO CAPS: 100.0000 mg | ORAL_CAPSULE | Freq: Two times a day (BID) | ORAL | Status: DC | PRN

## 2015-05-03 MED ORDER — ASPIRIN EC 81 MG PO TBEC
81.0000 mg | DELAYED_RELEASE_TABLET | Freq: Every day | ORAL | Status: DC
  Administered 2015-05-04 – 2015-05-05 (×2): 81 mg via ORAL
  Filled 2015-05-03 (×2): qty 1

## 2015-05-03 MED ORDER — ONDANSETRON HCL 4 MG PO TABS: 4.0000 mg | ORAL_TABLET | Freq: Four times a day (QID) | ORAL | Status: DC | PRN

## 2015-05-03 MED ORDER — HYDROMORPHONE HCL 1 MG/ML IJ SOLN: 1.0000 mg | Freq: Once | INTRAMUSCULAR | Status: DC

## 2015-05-03 MED ORDER — PANTOPRAZOLE SODIUM 40 MG PO TBEC
40.0000 mg | DELAYED_RELEASE_TABLET | Freq: Every day | ORAL | Status: DC
  Filled 2015-05-03: qty 1

## 2015-05-03 MED ORDER — HYDRALAZINE HCL 50 MG PO TABS
50.0000 mg | ORAL_TABLET | Freq: Three times a day (TID) | ORAL | Status: DC
  Administered 2015-05-03: 50 mg via ORAL
  Filled 2015-05-03 (×2): qty 1

## 2015-05-03 MED ORDER — ENOXAPARIN SODIUM 30 MG/0.3ML ~~LOC~~ SOLN
30.0000 mg | SUBCUTANEOUS | Status: DC
  Administered 2015-05-03 – 2015-05-04 (×2): 30 mg via SUBCUTANEOUS
  Filled 2015-05-03 (×2): qty 0.3

## 2015-05-03 MED ORDER — FLUTICASONE PROPIONATE 50 MCG/ACT NA SUSP
2.0000 | Freq: Every day | NASAL | Status: DC
  Administered 2015-05-04 – 2015-05-05 (×2): 2 via NASAL
  Filled 2015-05-03: qty 16

## 2015-05-03 MED ORDER — HYDRALAZINE HCL 20 MG/ML IJ SOLN
5.0000 mg | INTRAMUSCULAR | Status: DC | PRN
  Administered 2015-05-03: 5 mg via INTRAVENOUS
  Filled 2015-05-03: qty 1

## 2015-05-03 MED ORDER — DM-GUAIFENESIN ER 30-600 MG PO TB12: 1.0000 | ORAL_TABLET | Freq: Three times a day (TID) | ORAL | Status: DC | PRN

## 2015-05-03 MED ORDER — LABETALOL HCL 5 MG/ML IV SOLN: 10.0000 mg | Freq: Once | INTRAVENOUS | Status: DC

## 2015-05-03 MED ORDER — NAPHAZOLINE HCL 0.1 % OP SOLN
1.0000 [drp] | Freq: Four times a day (QID) | OPHTHALMIC | Status: DC | PRN
  Filled 2015-05-03: qty 15

## 2015-05-03 MED ORDER — LANTHANUM CARBONATE 500 MG PO CHEW
1000.0000 mg | CHEWABLE_TABLET | Freq: Three times a day (TID) | ORAL | Status: DC
  Administered 2015-05-04 – 2015-05-05 (×4): 1000 mg via ORAL
  Filled 2015-05-03 (×8): qty 2

## 2015-05-03 MED ORDER — MORPHINE SULFATE (PF) 2 MG/ML IV SOLN: 1.0000 mg | INTRAVENOUS | Status: DC | PRN

## 2015-05-03 MED ORDER — SODIUM CHLORIDE 0.9 % IJ SOLN
3.0000 mL | Freq: Two times a day (BID) | INTRAMUSCULAR | Status: DC
  Administered 2015-05-03 – 2015-05-04 (×2): 3 mL via INTRAVENOUS

## 2015-05-03 MED ORDER — KETOROLAC TROMETHAMINE 30 MG/ML IJ SOLN: 60.0000 mg | Freq: Once | INTRAMUSCULAR | Status: DC

## 2015-05-03 MED ORDER — HYDRALAZINE HCL 25 MG PO TABS: 50.0000 mg | ORAL_TABLET | Freq: Three times a day (TID) | ORAL | Status: DC

## 2015-05-03 MED ORDER — SIMVASTATIN 20 MG PO TABS
20.0000 mg | ORAL_TABLET | Freq: Every day | ORAL | Status: DC
  Administered 2015-05-03 – 2015-05-04 (×2): 20 mg via ORAL
  Filled 2015-05-03 (×2): qty 1

## 2015-05-03 MED ORDER — ONDANSETRON HCL 4 MG/2ML IJ SOLN: 4.0000 mg | Freq: Four times a day (QID) | INTRAMUSCULAR | Status: DC | PRN

## 2015-05-03 MED ORDER — BENZONATATE 100 MG PO CAPS: 100.0000 mg | ORAL_CAPSULE | Freq: Three times a day (TID) | ORAL | Status: DC | PRN

## 2015-05-03 MED ORDER — SODIUM CHLORIDE 0.9 % IV SOLN
INTRAVENOUS | Status: DC
  Administered 2015-05-03: 10 mL/h via INTRAVENOUS

## 2015-05-03 MED ORDER — ALBUTEROL SULFATE (2.5 MG/3ML) 0.083% IN NEBU: 2.5000 mg | INHALATION_SOLUTION | Freq: Four times a day (QID) | RESPIRATORY_TRACT | Status: DC | PRN

## 2015-05-03 MED ORDER — LORATADINE 10 MG PO TABS
10.0000 mg | ORAL_TABLET | Freq: Every day | ORAL | Status: DC
  Administered 2015-05-04 – 2015-05-05 (×2): 10 mg via ORAL
  Filled 2015-05-03 (×2): qty 1

## 2015-05-03 MED ORDER — ALPRAZOLAM 0.5 MG PO TABS
0.5000 mg | ORAL_TABLET | Freq: Three times a day (TID) | ORAL | Status: DC
  Administered 2015-05-03 – 2015-05-05 (×5): 0.5 mg via ORAL
  Filled 2015-05-03 (×6): qty 1

## 2015-05-03 MED ORDER — LABETALOL HCL 200 MG PO TABS
200.0000 mg | ORAL_TABLET | Freq: Two times a day (BID) | ORAL | Status: DC
  Administered 2015-05-03 – 2015-05-04 (×3): 200 mg via ORAL
  Filled 2015-05-03 (×3): qty 1

## 2015-05-03 MED ORDER — CLONIDINE HCL 0.2 MG PO TABS
0.3000 mg | ORAL_TABLET | Freq: Two times a day (BID) | ORAL | Status: DC
  Administered 2015-05-04 – 2015-05-05 (×3): 0.3 mg via ORAL
  Filled 2015-05-03 (×6): qty 1

## 2015-05-03 MED ORDER — NITROGLYCERIN IN D5W 200-5 MCG/ML-% IV SOLN
10.0000 ug/min | INTRAVENOUS | Status: DC
  Filled 2015-05-03: qty 250

## 2015-05-03 MED ORDER — CINACALCET HCL 30 MG PO TABS
30.0000 mg | ORAL_TABLET | Freq: Every day | ORAL | Status: DC
  Administered 2015-05-04: 30 mg via ORAL
  Filled 2015-05-03 (×2): qty 1

## 2015-05-03 MED ORDER — DIALYVITE 3000 3 MG PO TABS
1.0000 | ORAL_TABLET | Freq: Every day | ORAL | Status: DC
  Filled 2015-05-03: qty 1

## 2015-05-03 MED ORDER — NITROGLYCERIN 0.4 MG SL SUBL: 0.4000 mg | SUBLINGUAL_TABLET | SUBLINGUAL | Status: DC | PRN

## 2015-05-03 NOTE — ED Provider Notes (Signed)
345 PM patient continues to complain of anterior chest pressure which is onset 9 AM today upon awakening accompanied by shortness of breath. She treated herself with 2 baby aspirins and 3 sublingual nitroglycerin this morning with relief. She feels that not enough fluid was pulled off at her last hemodialysis session. She does not feel well and up to go home presently. On exam alert nontoxic speaks in  No distress HEENT exam no facial asymmetry neck supple positive JVD lungs no respiratory distress clear to auscultation abdomen obese nontender left upper extremity with fistula with good thrill all 4 extremities without edema. ED ECG REPORT   Date: 05/03/2015  Rate: 105  Rhythm: sinus tachycardia  QRS Axis: left  Intervals: normal  ST/T Wave abnormalities: nonspecific T wave changes  Conduction Disutrbances:left anterior fascicular block  Narrative Interpretation:   Old EKG Reviewed: Questionable lateral ischemia versus nonspecific T wave changes seen on 01/21/2015 have resolved  I have personally reviewed the EKG tracing and agree with the computerized printout as noted. Results for orders placed or performed during the hospital encounter of 05/03/15  APTT  Result Value Ref Range   aPTT 35 24 - 37 seconds  CBC  Result Value Ref Range   WBC 7.2 4.0 - 10.5 K/uL   RBC 3.69 (L) 3.87 - 5.11 MIL/uL   Hemoglobin 10.7 (L) 12.0 - 15.0 g/dL   HCT 35.6 (L) 36.0 - 46.0 %   MCV 96.5 78.0 - 100.0 fL   MCH 29.0 26.0 - 34.0 pg   MCHC 30.1 30.0 - 36.0 g/dL   RDW 18.1 (H) 11.5 - 15.5 %   Platelets 276 150 - 400 K/uL  Comprehensive metabolic panel  Result Value Ref Range   Sodium 139 135 - 145 mmol/L   Potassium 5.1 3.5 - 5.1 mmol/L   Chloride 99 (L) 101 - 111 mmol/L   CO2 27 22 - 32 mmol/L   Glucose, Bld 120 (H) 65 - 99 mg/dL   BUN 46 (H) 6 - 20 mg/dL   Creatinine, Ser 9.96 (H) 0.44 - 1.00 mg/dL   Calcium 9.3 8.9 - 10.3 mg/dL   Total Protein 6.6 6.5 - 8.1 g/dL   Albumin 3.6 3.5 - 5.0 g/dL   AST 18 15 - 41 U/L   ALT 17 14 - 54 U/L   Alkaline Phosphatase 78 38 - 126 U/L   Total Bilirubin 0.6 0.3 - 1.2 mg/dL   GFR calc non Af Amer 3 (L) >60 mL/min   GFR calc Af Amer 4 (L) >60 mL/min   Anion gap 13 5 - 15  Protime-INR  Result Value Ref Range   Prothrombin Time 14.9 11.6 - 15.2 seconds   INR 1.15 0.00 - 1.49  I-Stat Troponin, ED (not at [facility])  Result Value Ref Range   Troponin i, poc 0.01 0.00 - 0.08 ng/mL   Comment 3          I-stat troponin, ED  Result Value Ref Range   Troponin i, poc 0.02 0.00 - 0.08 ng/mL   Comment 3           Dg Chest 2 View  05/03/2015  CLINICAL DATA:  74 year old female with acute chest pain. Patient on dialysis and history of CABG. EXAM: CHEST  2 VIEW COMPARISON:  01/27/2015 and prior radiographs FINDINGS: Cardiomegaly and CABG changes again noted. Pulmonary vascular congestion noted. New interstitial opacities likely represent mild interstitial edema. Trace bilateral pleural effusions noted. There is no evidence of pneumothorax  or acute bony abnormality. IMPRESSION: Cardiomegaly with pulmonary vascular congestion, probable mild interstitial edema and trace bilateral pleural effusions. Electronically Signed   By: Margarette Canada M.D.   On: 05/03/2015 12:22     4:30 PM feels improved after treatment with intravenous nitroglycerin drip ordered by me.  5:20 PM continues to feel improved with increased titration nitroglycerin drip. She appears comforartble. Eating as meal in bed Medical decision-making patient with some degree of congestive heart failure given JVD, dyspnea and pleuritic congestion on chest x-ray. She also may be experiencing some degree of angina. I spoke with Dr.Gosrani Plan admit stepdown unit iv ntg drip, hemodialysis tomorrow Dx :Unstable angina #2 chf #3htn CRITICAL CARE Performed by: Orlie Dakin Total critical care time: 30 minute Critical care time was exclusive of separately billable procedures and treating other  patients. Critical care was necessary to treat or prevent imminent or life-threatening deterioration. Critical care was time spent personally by me on the following activities: development of treatment plan with patient and/or surrogate as well as nursing, discussions with consultants, evaluation of patient's response to treatment, examination of patient, obtaining history from patient or surrogate, ordering and performing treatments and interventions, ordering and review of laboratory studies, ordering and review of radiographic studies, pulse oximetry and re-evaluation of patient's condition.  Orlie Dakin, MD 05/03/15 1728

## 2015-05-03 NOTE — H&P (Signed)
[facility] History and Physical  Shawna [name] [id number] DOB: 1941-04-03 DOA: 05/03/2015  Referring physician: ER PCP: [name], MD   Chief Complaint: Chest pain, dyspnea  HPI: Shawna [name] is a 74 y.o. female  This is a 74 year old lady who has a history of end-stage renal disease on hemodialysis now presents with chest pain intermittently today. Began with shoulder pain on the right side with any chills or got pain in the left side of the chest associated with increasing dyspnea. She took 2 baby aspirin's and 3 sublingual nitroglycerin this morning with some relief. She feels that not enough fluid was pulled off at her last dialysis session which was 2 days ago. She denies any cough or fever or hemoptysis. There is no leg swelling. She has a history of CABG in 2013. Evaluation in the emergency room shows the possibility of congestive heart failure together with unstable angina. She has been started on nitroglycerin drip and her symptoms appear to be somewhat better. She is now being admitted for further management.   Review of Systems:  Apart from symptoms above, all systems are negative.  Past Medical History  Diagnosis Date  . Chronic bronchitis ([place])   . GERD (gastroesophageal reflux disease)   . PUD (peptic ulcer disease)   . History of lower GI bleeding   . Arthritis   . History of gout   . CAD (coronary artery disease)     a. 12/2011 NSTEMI/Cath/PCI LCX (2.25x14 Resolute DES) & D1 (2.25x22 Resolute DES);  b. 01/2012 Cath/PCI: LM 30, LAD 30p, 40-33m, D1 stent ok, 99 in sm branch of diag, LCX patent stent, OM1 20, RCA 95 ost (4.0x12 Promus DES), EF 55%;  c. 04/2012 Lexi Cardiolite  EF 48%, small area of scar @ base/mid inflat wall with mild peri-infarct ischemia.; CABG 12/4  . High cholesterol 12/2011  . Pneumonia ~ 2009  . Iron deficiency anemia   . TIA (transient ischemic attack)   . Anxiety   . History of blood transfusion 07/2011; 12/2011; 01/2012 X 2; 04/2012  .  Carotid artery disease ([place])     a. [id number] LICA, [id number]   . Mitral regurgitation     a. Moderate by echo, 02/2012  . Myocardial infarction ([place])   . Chronic diastolic CHF (congestive heart failure) ([place])     a. 02/2012 Echo EF 60-65%, nl wall motion, Gr 1 DD, mod MR  . Hypertension   . AVM (arteriovenous malformation) of colon   . Esophageal stricture   . Ovarian cancer ([place]) 1992  . Colon cancer ([place]) 1992  . ESRD on hemodialysis ([place])     ESRD due to HTN, started dialysis 2011 and gets HD at [facility] with Dr Hinda Lenis on MWF schedule.  Access is LUA AVF as of Sept 2014.    Past Surgical History  Procedure Laterality Date  . Abdominal hysterectomy  1992  . Appendectomy  06/1990  . Tubal ligation  1980's  . Av fistula placement  07/2009    left upper arm  . Thrombectomy / arteriovenous graft revision  2011    left upper arm  . Colon resection  1992  . Esophagogastroduodenoscopy  01/20/2012    Procedure: ESOPHAGOGASTRODUODENOSCOPY (EGD);  Surgeon: Ladene Artist, MD,FACG;  Location: [facility] ENDOSCOPY;  Service: Endoscopy;  Laterality: N/A;  . Dilation and curettage of uterus    . Coronary angioplasty with stent placement  12/15/11    "2"  . Coronary angioplasty with stent placement  y/2013    "  1; makes total of 3" (05/02/2012)  . Coronary artery bypass graft  06/13/2012    Procedure: CORONARY ARTERY BYPASS GRAFTING (CABG);  Surgeon: Grace Isaac, MD;  Location: [place];  Service: Open Heart Surgery;  Laterality: N/A;  cabg x four;  using left internal mammary artery, and left leg greater saphenous vein harvested endoscopically  . Intraoperative transesophageal echocardiogram  06/13/2012    Procedure: INTRAOPERATIVE TRANSESOPHAGEAL ECHOCARDIOGRAM;  Surgeon: Grace Isaac, MD;  Location: [place];  Service: Open Heart Surgery;  Laterality: N/A;  . Esophagogastroduodenoscopy N/A 03/26/2013    Procedure: ESOPHAGOGASTRODUODENOSCOPY (EGD);  Surgeon: Irene Shipper, MD;  Location: [facility] ENDOSCOPY;   Service: Endoscopy;  Laterality: N/A;  . Ovary surgery      ovarian cancer  . Cardiac surgery    . Left heart catheterization with coronary angiogram N/A 12/15/2011    Procedure: LEFT HEART CATHETERIZATION WITH CORONARY ANGIOGRAM;  Surgeon: Burnell Blanks, MD;  Location: [facility] CATH LAB;  Service: Cardiovascular;  Laterality: N/A;  . Left heart catheterization with coronary angiogram N/A 01/10/2012    Procedure: LEFT HEART CATHETERIZATION WITH CORONARY ANGIOGRAM;  Surgeon: Peter M Martinique, MD;  Location: [facility] CATH LAB;  Service: Cardiovascular;  Laterality: N/A;  . Left heart catheterization with coronary angiogram N/A 06/08/2012    Procedure: LEFT HEART CATHETERIZATION WITH CORONARY ANGIOGRAM;  Surgeon: Burnell Blanks, MD;  Location: [facility] CATH LAB;  Service: Cardiovascular;  Laterality: N/A;  . Shuntogram N/A 10/15/2013    Procedure: Fistulogram;  Surgeon: Serafina Mitchell, MD;  Location: [facility] CATH LAB;  Service: Cardiovascular;  Laterality: N/A;  . Left heart catheterization with coronary/graft angiogram N/A 12/10/2013    Procedure: LEFT HEART CATHETERIZATION WITH Beatrix Fetters;  Surgeon: [name], MD;  Location: [facility] CATH LAB;  Service: Cardiovascular;  Laterality: N/A;   Social History:  reports that she has never smoked. She has never used smokeless tobacco. She reports that she does not drink alcohol or use illicit drugs.  Allergies  Allergen Reactions  . Aspirin Other (See Comments)    Mess up her stomach; "makes my bowels have blood in them". Takes 81 mg EC Aspirin   . Contrast Media [Iodinated Diagnostic Agents] Itching  . Iron Itching and Other (See Comments)    "they gave me iron in dialysis; had to give me Benadryl cause I had to have the iron" (05/02/2012)  . Macrodantin [Nitrofurantoin Macrocrystal] Other (See Comments)    "broke me out in big old knots all over my body; had to go to ER"  . Penicillins Other (See Comments)    "makes me real weak when I take  it; like I'll pass out"  . Plavix [Clopidogrel Bisulfate] Rash  . Amlodipine Swelling    Leg swelling  . Bactrim [Sulfamethoxazole-Trimethoprim] Rash  . Dexilant [Dexlansoprazole] Other (See Comments)    Upset stomach  . Morphine And Related Itching    Itching in feet  . Prilosec [Omeprazole] Other (See Comments)    "back spasms"  . Sulfa Antibiotics Rash  . Venofer [Ferric Oxide] Itching    Patient reports using Benadryl prior to doses as [facility]  . Levaquin [Levofloxacin In D5w] Rash  . Protonix [Pantoprazole Sodium] Rash    Family History  Problem Relation Age of Onset  . Other      noncontributory for early CAD  . Heart disease Mother     Heart Disease before age 71  . Hyperlipidemia Mother   . Hypertension Mother   .  Diabetes Mother   . Heart attack Mother   . Heart disease Father     Heart Disease before age 47  . Hyperlipidemia Father   . Hypertension Father   . Diabetes Father   . Diabetes Sister   . Hypertension Sister   . Diabetes Brother   . Hyperlipidemia Brother   . Heart attack Brother   . Colon cancer Neg Hx   . Esophageal cancer Neg Hx   . Liver disease Neg Hx   . Kidney disease Neg Hx   . Colon polyps Neg Hx   . Hypertension Sister   . Heart attack Brother      Prior to Admission medications   Medication Sig Start Date End Date Taking? Authorizing Provider  acetaminophen (TYLENOL) 325 MG tablet Take 2 tablets (650 mg total) by mouth every 6 (six) hours as needed for mild pain (mild pain). 10/02/14  Yes Estela Leonie Green, MD  ALPRAZolam Duanne Moron) 0.5 MG tablet Take 1 tablet (0.5 mg total) by mouth 3 (three) times daily. 01/27/15  Yes Theodis Blaze, MD  aspirin EC 81 MG tablet Take 81 mg by mouth daily.    Yes Historical Provider, MD  benzonatate (TESSALON PERLES) 100 MG capsule Take 1 capsule (100 mg total) by mouth 3 (three) times daily as needed for cough. 10/02/14  Yes Erline Hau, MD  cloNIDine (CATAPRES) 0.3 MG tablet  Take 1 tablet (0.3 mg total) by mouth 2 (two) times daily. 12/19/14  Yes Herminio Commons, MD  dextromethorphan-guaiFENesin [facility]) 30-600 MG per 12 hr tablet Take 1 tablet by mouth daily. Patient taking differently: Take 1 tablet by mouth 3 (three) times daily as needed.  09/29/14  Yes Fredia [place], MD  fluticasone (FLONASE) 50 MCG/ACT nasal spray Place 2 sprays into the nose daily.    Yes Historical Provider, MD  folic acid-vitamin b complex-vitamin c-selenium-zinc (DIALYVITE) 3 MG TABS Take 1 tablet by mouth daily.   Yes Historical Provider, MD  hydrALAZINE (APRESOLINE) 50 MG tablet Take 1 tablet (50 mg total) by mouth 3 (three) times daily. 01/27/15  Yes Theodis Blaze, MD  isosorbide dinitrate (ISORDIL) 20 MG tablet Take 20 mg by mouth 3 (three) times daily. 01/15/15  Yes Historical Provider, MD  labetalol (NORMODYNE) 200 MG tablet Take 200 mg by mouth 2 (two) times daily.   Yes Historical Provider, MD  lanthanum (FOSRENOL) 1000 MG chewable tablet Chew 1,000 mg by mouth 3 (three) times daily after meals.   Yes Historical Provider, MD  lidocaine-prilocaine (EMLA) cream Apply 1 application topically daily as needed (for port access).  11/29/14  Yes Historical Provider, MD  loratadine (CLARITIN) 10 MG tablet Take 10 mg by mouth daily.    Yes Historical Provider, MD  nitroGLYCERIN (NITROSTAT) 0.4 MG SL tablet Place 1 tablet (0.4 mg total) under the tongue every 5 (five) minutes x 3 doses as needed for chest pain. 06/23/14  Yes Herminio Commons, MD  omeprazole (PRILOSEC) 20 MG capsule TAKE ONE CAPSULE BY MOUTH ONCE DAILY 03/02/15  Yes Ladene Artist, MD  polyethylene glycol [facility] / GLYCOLAX) packet Take 17 g by mouth daily. Patient taking differently: Take 17 g by mouth daily as needed for mild constipation or moderate constipation.  10/02/14  Yes Estela Leonie Green, MD  PROAIR HFA 108 [phone] BASE) MCG/ACT inhaler Inhale 1 puff into the lungs every 6 (six) hours as needed for wheezing or  shortness of breath.  01/09/15  Yes  Historical Provider, MD  SENSIPAR 30 MG tablet Take 30 mg by mouth daily with supper.  12/21/12  Yes Historical Provider, MD  simvastatin (ZOCOR) 20 MG tablet Take 1 tablet (20 mg total) by mouth at bedtime. 11/04/14  Yes Herminio Commons, MD  tetrahydrozoline 0.05 % ophthalmic solution Place 1 drop into both eyes 2 (two) times daily as needed (irritation).   Yes Historical Provider, MD   Physical Exam: Filed Vitals:   05/03/15 1650 05/03/15 1700 05/03/15 1730 05/03/15 1800  BP: 201/93 197/92 190/82 196/80  Pulse: 73 72  79  Temp:      TempSrc:      Resp: 16 16 18 23   Height:      Weight:      SpO2: 92% 93%  96%    Wt Readings from Last 3 Encounters:  05/03/15 62.143 kg (137 lb)  04/30/15 62.143 kg (137 lb)  04/02/15 62.506 kg (137 lb 12.8 oz)    General:  Appears calm and comfortable. She does not appear to be in pain at the present time. Blood pressure is somewhat elevated. Eyes: PERRL, normal lids, irises & conjunctiva ENT: grossly normal hearing, lips & tongue Neck: no LAD, masses or thyromegaly Cardiovascular: RRR, no m/r/g. No LE edema. Telemetry: SR, no arrhythmias  Respiratory: CTA bilaterally, no w/r/r. Normal respiratory effort. Abdomen: soft, ntnd Skin: no rash or induration seen on limited exam Musculoskeletal: grossly normal tone BUE/BLE Psychiatric: grossly normal mood and affect, speech fluent and appropriate Neurologic: grossly non-focal.          Labs on Admission:  Basic Metabolic Panel:  Recent Labs Lab 05/03/15 1158  NA 139  K 5.1  CL 99*  CO2 27  GLUCOSE 120*  BUN 46*  CREATININE 9.96*  CALCIUM 9.3   Liver Function Tests:  Recent Labs Lab 05/03/15 1158  AST 18  ALT 17  ALKPHOS 78  BILITOT 0.6  PROT 6.6  ALBUMIN 3.6   No results for input(s): LIPASE, AMYLASE in the last 168 hours. No results for input(s): AMMONIA in the last 168 hours. CBC:  Recent Labs Lab 05/03/15 1158  WBC 7.2  HGB  10.7*  HCT 35.6*  MCV 96.5  PLT 276   Cardiac Enzymes: No results for input(s): CKTOTAL, CKMB, CKMBINDEX, TROPONINI in the last 168 hours.  BNP (last 3 results) No results for input(s): BNP in the last 8760 hours.  ProBNP (last 3 results) No results for input(s): PROBNP in the last 8760 hours.  CBG: No results for input(s): GLUCAP in the last 168 hours.  Radiological Exams on Admission: Dg Chest 2 View  05/03/2015  CLINICAL DATA:  74 year old female with acute chest pain. Patient on dialysis and history of CABG. EXAM: CHEST  2 VIEW COMPARISON:  01/27/2015 and prior radiographs FINDINGS: Cardiomegaly and CABG changes again noted. Pulmonary vascular congestion noted. New interstitial opacities likely represent mild interstitial edema. Trace bilateral pleural effusions noted. There is no evidence of pneumothorax or acute bony abnormality. IMPRESSION: Cardiomegaly with pulmonary vascular congestion, probable mild interstitial edema and trace bilateral pleural effusions. Electronically Signed   By: Margarette Canada M.D.   On: 05/03/2015 12:22      Assessment/Plan   1. Chest pain. This possibly represents unstable angina. She does have a history of coronary artery disease and CABG. Serial cardiac enzymes. Echocardiogram. Cardiology consultation. 2. Possible congestive heart failure. She appears to have this on chest x-ray although there are not significant clinical signs. She has been started on  nitroglycerin drip she will require dialysis tomorrow and I have consulted nephrology in regard to this. 3. Hypertension. Blood pressure is not well controlled. I would adjust medications in regard to this..  She'll be admitted to the stepdown unit. Further recommendations will depend on patient's hospital progress.    Code Status: Full code.   DVT Prophylaxis:Lovenox   Family Communication: I discussed the plan with the patient at the bedside.    Disposition Plan: Home when medically stable.    Time spent: 60 minutes.  Doree Albee [facility] Pager [phone].

## 2015-05-03 NOTE — ED Notes (Signed)
Reports while cooking breakfast this morning she developed let side chest pain that radiates to left arm then between shoulder blades. Reports of SOB. Denies at this time. Took 3 nitro with relief. NADN at this time.

## 2015-05-03 NOTE — ED Notes (Signed)
Into discharge patient, patient stated she "aint feeling no better, and aint leaving". Jacobitz informed, he will speak to the patient.

## 2015-05-03 NOTE — ED Notes (Signed)
Meal given

## 2015-05-03 NOTE — ED Notes (Signed)
Report given to [facility] all questions answered.

## 2015-05-03 NOTE — Discharge Instructions (Signed)
Return to the ED with any concerns including worsening shortness of breath, fever/chills, fainting, leg swelling, decreased level of alertness/lethargy, or any other alarming symptoms

## 2015-05-03 NOTE — ED Notes (Signed)
Patient transferred to ICU/SDU room 02 by myself on cardiac monitor. Nitro gtt infusing at 63mcg.min, this was verified with nurse upon arrival.

## 2015-05-03 NOTE — ED Notes (Signed)
Patient states chest pressure and SOB has decreased since Nitro gtt was started.

## 2015-05-04 ENCOUNTER — Encounter (HOSPITAL_COMMUNITY): Payer: Self-pay | Admitting: Internal Medicine

## 2015-05-04 ENCOUNTER — Inpatient Hospital Stay (HOSPITAL_COMMUNITY): Payer: Medicare Other

## 2015-05-04 DIAGNOSIS — E785 Hyperlipidemia, unspecified: Secondary | ICD-10-CM

## 2015-05-04 DIAGNOSIS — N186 End stage renal disease: Secondary | ICD-10-CM

## 2015-05-04 DIAGNOSIS — I5033 Acute on chronic diastolic (congestive) heart failure: Secondary | ICD-10-CM

## 2015-05-04 DIAGNOSIS — R079 Chest pain, unspecified: Secondary | ICD-10-CM

## 2015-05-04 DIAGNOSIS — I209 Angina pectoris, unspecified: Secondary | ICD-10-CM

## 2015-05-04 DIAGNOSIS — I160 Hypertensive urgency: Secondary | ICD-10-CM

## 2015-05-04 DIAGNOSIS — I10 Essential (primary) hypertension: Secondary | ICD-10-CM

## 2015-05-04 LAB — COMPREHENSIVE METABOLIC PANEL
ALBUMIN: 3.2 g/dL — AB (ref 3.5–5.0)
ALK PHOS: 70 U/L (ref 38–126)
ALT: 12 U/L — AB (ref 14–54)
AST: 13 U/L — AB (ref 15–41)
Anion gap: 11 (ref 5–15)
BUN: 57 mg/dL — AB (ref 6–20)
CALCIUM: 8.8 mg/dL — AB (ref 8.9–10.3)
CHLORIDE: 102 mmol/L (ref 101–111)
CO2: 25 mmol/L (ref 22–32)
CREATININE: 11.52 mg/dL — AB (ref 0.44–1.00)
GFR calc Af Amer: 3 mL/min — ABNORMAL LOW (ref >60)
GFR calc non Af Amer: 3 mL/min — ABNORMAL LOW (ref >60)
GLUCOSE: 119 mg/dL — AB (ref 65–99)
Potassium: 5.3 mmol/L — ABNORMAL HIGH (ref 3.5–5.1)
SODIUM: 138 mmol/L (ref 135–145)
Total Bilirubin: 0.6 mg/dL (ref 0.3–1.2)
Total Protein: 5.8 g/dL — ABNORMAL LOW (ref 6.5–8.1)

## 2015-05-04 LAB — TROPONIN I
TROPONIN I: 0.03 ng/mL (ref <0.031)
Troponin I: 0.05 ng/mL — ABNORMAL HIGH (ref <0.031)

## 2015-05-04 LAB — CBC
HCT: 33 % — ABNORMAL LOW (ref 36.0–46.0)
Hemoglobin: 9.9 g/dL — ABNORMAL LOW (ref 12.0–15.0)
MCH: 28.7 pg (ref 26.0–34.0)
MCHC: 30 g/dL (ref 30.0–36.0)
MCV: 95.7 fL (ref 78.0–100.0)
PLATELETS: 261 10*3/uL (ref 150–400)
RBC: 3.45 MIL/uL — AB (ref 3.87–5.11)
RDW: 18.4 % — AB (ref 11.5–15.5)
WBC: 7.4 10*3/uL (ref 4.0–10.5)

## 2015-05-04 LAB — BRAIN NATRIURETIC PEPTIDE: B Natriuretic Peptide: 1041 pg/mL — ABNORMAL HIGH (ref 0.0–100.0)

## 2015-05-04 MED ORDER — EPOETIN ALFA 10000 UNIT/ML IJ SOLN
INTRAMUSCULAR | Status: AC
  Administered 2015-05-04: 10000 [IU] via INTRAVENOUS
  Filled 2015-05-04: qty 1

## 2015-05-04 MED ORDER — SODIUM CHLORIDE 0.9 % IV SOLN: 100.0000 mL | INTRAVENOUS | Status: DC | PRN

## 2015-05-04 MED ORDER — PENTAFLUOROPROP-TETRAFLUOROETH EX AERO
INHALATION_SPRAY | CUTANEOUS | Status: AC
  Administered 2015-05-04: 1 via TOPICAL
  Filled 2015-05-04: qty 103.5

## 2015-05-04 MED ORDER — RENA-VITE PO TABS
1.0000 | ORAL_TABLET | Freq: Every day | ORAL | Status: DC
  Administered 2015-05-05: 1 via ORAL
  Filled 2015-05-04 (×2): qty 1

## 2015-05-04 MED ORDER — LIDOCAINE-PRILOCAINE 2.5-2.5 % EX CREA
1.0000 "application " | TOPICAL_CREAM | CUTANEOUS | Status: DC | PRN
  Filled 2015-05-04: qty 5

## 2015-05-04 MED ORDER — LIDOCAINE HCL (PF) 1 % IJ SOLN: 5.0000 mL | INTRAMUSCULAR | Status: DC | PRN

## 2015-05-04 MED ORDER — NAPHAZOLINE-PHENIRAMINE 0.025-0.3 % OP SOLN
1.0000 [drp] | Freq: Four times a day (QID) | OPHTHALMIC | Status: DC | PRN
Start: 2015-05-04 — End: 2015-05-05
  Filled 2015-05-04: qty 5

## 2015-05-04 MED ORDER — EPOETIN ALFA 10000 UNIT/ML IJ SOLN
10000.0000 [IU] | INTRAMUSCULAR | Status: DC
  Administered 2015-05-04: 10000 [IU] via INTRAVENOUS
  Filled 2015-05-04: qty 1

## 2015-05-04 MED ORDER — ALTEPLASE 2 MG IJ SOLR
2.0000 mg | Freq: Once | INTRAMUSCULAR | Status: DC | PRN
  Filled 2015-05-04: qty 2

## 2015-05-04 MED ORDER — ISOSORBIDE DINITRATE 20 MG PO TABS
20.0000 mg | ORAL_TABLET | Freq: Three times a day (TID) | ORAL | Status: DC
  Administered 2015-05-04 – 2015-05-05 (×3): 20 mg via ORAL
  Filled 2015-05-04 (×3): qty 1

## 2015-05-04 MED ORDER — HEPARIN SODIUM (PORCINE) 1000 UNIT/ML DIALYSIS
1000.0000 [IU] | INTRAMUSCULAR | Status: DC | PRN
  Filled 2015-05-04: qty 1

## 2015-05-04 MED ORDER — PENTAFLUOROPROP-TETRAFLUOROETH EX AERO
1.0000 "application " | INHALATION_SPRAY | CUTANEOUS | Status: DC | PRN
  Administered 2015-05-04: 1 via TOPICAL
  Filled 2015-05-04 (×2): qty 30

## 2015-05-04 MED ORDER — HYDRALAZINE HCL 25 MG PO TABS
75.0000 mg | ORAL_TABLET | Freq: Three times a day (TID) | ORAL | Status: DC
  Administered 2015-05-04 – 2015-05-05 (×3): 75 mg via ORAL
  Filled 2015-05-04 (×3): qty 3

## 2015-05-04 NOTE — Progress Notes (Signed)
[facility] PROGRESS NOTE  Shawna [name] [id number] DOB: [birth year] DOA: 05/03/2015 PCP: [name], MD  Assessment/Plan: 1. Chest pain possibly related to volume overload/severe hypertension. ECHO and cardiology consult ordered. Has been started on NTG drip with improvement in pain.She is continue on her outpatient antihypertensives and blood pressure is improving. Will try to wean off NTG drip as tolerated. Cardiac enzymes thus far have been negative. She is not having any further chest pains.  2. Acute on chronic diastolic CHF. Noted on CXR. Requires HD today. Nephrology has been consulted. 3. Hypertensive urgency. Restarted on home medications and NTG infusions. Will likely need further adjustment in her antihypertensives. But this will be reevaluated after she has underwent dialysis.   4. History of CAD and CABG. No further chest pains and cardiac enzymes are negative. EKG did not shown any acute changes. Continue ASA. 5. ESRD. Dialysis scheduled for today. Nephrology has been consulted.  6. GERD. Will continue PPI 7. HLD. Continue statin.   Code Status: Full DVT prophylaxis: Lovenox Family Communication:Discussed with patient who understands and has no concerns at this time. Disposition Plan: Anticipate discharge within 24 hours.    Consultants:  Cardiology  Nephrology  Procedures:    Antibiotics:    HPI/Subjective: Feeling well, since she has been started on NTG drip. Denies SOB and missing medications or dialysis. Reports tightness in chest onset Saturday evening (10/22) and progressively worsened.   Objective: Filed Vitals:   05/04/15 0600  BP: 157/71  Pulse: 74  Temp:   Resp: 19    Intake/Output Summary (Last 24 hours) at 05/04/15 0900 Last data filed at 05/04/15 0600  Gross per 24 hour  Intake 376.81 ml  Output      0 ml  Net 376.81 ml   Filed Weights   05/03/15 1057 05/04/15 0500  Weight: 62.143 kg (137 lb) 63.1 kg (139 lb 1.8 oz)     Exam:  General: NAD. Sitting up in bed and looks comfortable Cardiovascular: RRR, S1, S2  Respiratory: clear bilaterally, No wheezing, rales or rhonchi Abdomen: soft, non tender, no distention , bowel sounds normal Musculoskeletal: No edema b/l   Data Reviewed: Basic Metabolic Panel:  Recent Labs Lab 05/03/15 1158  NA 139  K 5.1  CL 99*  CO2 27  GLUCOSE 120*  BUN 46*  CREATININE 9.96*  CALCIUM 9.3   Liver Function Tests:  Recent Labs Lab 05/03/15 1158  AST 18  ALT 17  ALKPHOS 78  BILITOT 0.6  PROT 6.6  ALBUMIN 3.6   CBC:  Recent Labs Lab 05/03/15 1158  WBC 7.2  HGB 10.7*  HCT 35.6*  MCV 96.5  PLT 276   Cardiac Enzymes:  Recent Labs Lab 05/04/15 0039  TROPONINI 0.03    Studies: Dg Chest 2 View  05/03/2015  CLINICAL DATA:  74 year old female with acute chest pain. Patient on dialysis and history of CABG. EXAM: CHEST  2 VIEW COMPARISON:  01/27/2015 and prior radiographs FINDINGS: Cardiomegaly and CABG changes again noted. Pulmonary vascular congestion noted. New interstitial opacities likely represent mild interstitial edema. Trace bilateral pleural effusions noted. There is no evidence of pneumothorax or acute bony abnormality. IMPRESSION: Cardiomegaly with pulmonary vascular congestion, probable mild interstitial edema and trace bilateral pleural effusions. Electronically Signed   By: Margarette Canada M.D.   On: 05/03/2015 12:22    Scheduled Meds: . ALPRAZolam  0.5 mg Oral TID  . aspirin EC  81 mg Oral Daily  . cinacalcet  30 mg Oral Q  supper  . cloNIDine  0.3 mg Oral BID  . enoxaparin (LOVENOX) injection  30 mg Subcutaneous Q24H  . fluticasone  2 spray Each Nare Daily  . hydrALAZINE  75 mg Oral TID  . isosorbide dinitrate  20 mg Oral TID  . labetalol  200 mg Oral BID  . lanthanum  1,000 mg Oral TID PC  . loratadine  10 mg Oral Daily  . multivitamin  1 tablet Oral Daily  . pantoprazole  40 mg Oral Daily  . simvastatin  20 mg Oral QHS  .  sodium chloride  3 mL Intravenous Q12H   Continuous Infusions: . sodium chloride 10 mL/hr at 05/04/15 0600  . nitroGLYCERIN 85 mcg/min (05/04/15 0600)    Active Problems:   Coronary atherosclerosis of native coronary artery   Essential hypertension, benign   Chronic diastolic CHF (congestive heart failure) (HCC)   Chest pain   ESRD (end stage renal disease) ([place])   Unstable angina ([place])    Time spent: 20 minutes     Kathie Dike, MD  [facility] Pager [phone]. If 7PM-7AM, please contact night-coverage at [email or web address], password [facility] 05/04/2015, 9:00 AM  LOS: 1 day      By signing my name below, I, Rennis Harding, attest that this documentation has been prepared under the direction and in the presence of Kathie Dike, MD. Electronically signed: Rennis Harding, Scribe. 05/04/2015 8:55AM  I, Dr. Kathie Dike, personally performed the services described in this documentaiton. All medical record entries made by the scribe were at my direction and in my presence. I have reviewed the chart and agree that the record reflects my personal performance and is accurate and complete  Kathie Dike, MD, 05/04/2015 9:40 AM

## 2015-05-04 NOTE — Procedures (Signed)
   HEMODIALYSIS TREATMENT NOTE:  4 hour heparin-free dialysis completed via left upper arm AVF (15g ante/retrograde). Goal met: 3 liters removed without interruption in ultrafiltration. Hypertensive last hour of HD with SBP 170-190. Primary RN to administer antihypertensives post-HD. All blood was reinfused and hemostasis was achieved within 20 minutes. Prolonged bleeding after needle removal, presumably r/t hypertension. Report given to [facility], RN.   Rockwell Alexandria, RN, CDN

## 2015-05-04 NOTE — Consult Note (Signed)
Reason for Consult: ESRD Refering Physician: Dr. Ermalene Searing Shawna [name] is an 74 y.o. female.  HPshe is a patient with history of hypertension, coronary artery disease, end-stage renal disease on maintenance hemodialysis presently came with complaints of substernal chest pain since yesterday. According the patient she had pain mainly on the left side with some radiation to her shoulder and also her hand. She describes it as pressure-like. She had also episode of faculty breathing. When she was evaluated in emergency room patient was found to have significantly elevated hypertension hence admitted for rule out MI. Presently patient feels much better. Pain is gone and she doesn't have any difficulty breathing.   Past Medical History  Diagnosis Date  . Chronic bronchitis ([place])   . GERD (gastroesophageal reflux disease)   . PUD (peptic ulcer disease)   . History of lower GI bleeding   . Arthritis   . History of gout   . CAD (coronary artery disease)     a. 12/2011 NSTEMI/Cath/PCI LCX (2.25x14 Resolute DES) & D1 (2.25x22 Resolute DES);  b. 01/2012 Cath/PCI: LM 30, LAD 30p, 40-25m D1 stent ok, 99 in sm branch of diag, LCX patent stent, OM1 20, RCA 95 ost (4.0x12 Promus DES), EF 55%;  c. 04/2012 Lexi Cardiolite  EF 48%, small area of scar @ base/mid inflat wall with mild peri-infarct ischemia.; CABG 12/4  . High cholesterol 12/2011  . Pneumonia ~ 2009  . Iron deficiency anemia   . TIA (transient ischemic attack)   . Anxiety   . History of blood transfusion 07/2011; 12/2011; 01/2012 X 2; 04/2012  . Carotid artery disease ([place]     a. 648-18%LICA, 95/6314  . Mitral regurgitation     a. Moderate by echo, 02/2012  . Myocardial infarction ([place]   . Chronic diastolic CHF (congestive heart failure) ([facility]     a. 02/2012 Echo EF 60-65%, nl wall motion, Gr 1 DD, mod MR  . Hypertension   . AVM (arteriovenous malformation) of colon   . Esophageal stricture   . Ovarian cancer ([facility] 1992  . Colon cancer ([place] 1992   . [place]ialysis ([place]     ESRD due to HTN, started dialysis 2011 [facility].  Access is LUA AVF as of Sept 2014.     Past Surgical History  Procedure Laterality Date  . Abdominal hysterectomy  1992  . Appendectomy  06/1990  . Tubal ligation  1980's  . Av fistula placement  07/2009    left upper arm  . Thrombectomy / arteriovenous graft revision  2011    left upper arm  . Colon resection  1992  . Esophagogastroduodenoscopy  01/20/2012    Procedure: ESOPHAGOGASTRODUODENOSCOPY (EGD);  Surgeon: MLadene Artist MD,FACG;  [facility];  Service: Endoscopy;  Laterality: N/A;  . Dilation and curettage of uterus    . Coronary angioplasty with stent placement  12/15/11    "2"  . Coronary angioplasty with stent placement  y/2013    "1; makes total of 3" (05/02/2012)  . Coronary artery bypass graft  06/13/2012    Procedure: CORONARY ARTERY BYPASS GRAFTING (CABG);  Surgeon: EGrace Isaac MD;  [facility]  Service: Open Heart Surgery;  Laterality: N/A;  cabg x four;  using left internal mammary artery, and left leg greater saphenous vein harvested endoscopically  . Intraoperative transesophageal echocardiogram  06/13/2012    Procedure: INTRAOPERATIVE TRANSESOPHAGEAL ECHOCARDIOGRAM;  Surgeon:  Grace Isaac, MD;  Location: [facility];  Service: Open Heart Surgery;  Laterality: N/A;  . Esophagogastroduodenoscopy N/A 03/26/2013    Procedure: ESOPHAGOGASTRODUODENOSCOPY (EGD);  Surgeon: Irene Shipper, MD;  Location: [facility] ENDOSCOPY;  Service: Endoscopy;  Laterality: N/A;  . Ovary surgery      ovarian cancer  . Cardiac surgery    . Left heart catheterization with coronary angiogram N/A 12/15/2011    Procedure: LEFT HEART CATHETERIZATION WITH CORONARY ANGIOGRAM;  Surgeon: Burnell Blanks, MD;  Location: [facility] C[facility] LAB;  Service: Cardiovascular;  Laterality: N/A;  . Left heart catheterization with coronary angiogram N/A 01/10/2012     Procedure: LEFT HEART CATHETERIZATION WITH CORONARY ANGIOGRAM;  Surgeon: Peter M Martinique, MD;  Location: [facility] CATH LAB;  Service: Cardiovascular;  Laterality: N/A;  . Left heart catheterization with coronary angiogram N/A 06/08/2012    Procedure: LEFT HEART CATHETERIZATION WITH CORONARY ANGIOGRAM;  Surgeon: Burnell Blanks, MD;  Location: [facility] CATH LAB;  Service: Cardiovascular;  Laterality: N/A;  . Shuntogram N/A 10/15/2013    Procedure: Fistulogram;  Surgeon: Serafina Mitchell, MD;  Location: [facility] CATH LAB;  Service: Cardiovascular;  Laterality: N/A;  . Left heart catheterization with coronary/graft angiogram N/A 12/10/2013    Procedure: LEFT HEART CATHETERIZATION WITH Beatrix Fetters;  Surgeon: [name], MD;  Location: [facility] CATH LAB;  Service: Cardiovascular;  Laterality: N/A;    Family History  Problem Relation Age of Onset  . Other      noncontributory for early CAD  . Heart disease Mother     Heart Disease before age 41  . Hyperlipidemia Mother   . Hypertension Mother   . Diabetes Mother   . Heart attack Mother   . Heart disease Father     Heart Disease before age 10  . Hyperlipidemia Father   . Hypertension Father   . Diabetes Father   . Diabetes Sister   . Hypertension Sister   . Diabetes Brother   . Hyperlipidemia Brother   . Heart attack Brother   . Colon cancer Neg Hx   . Esophageal cancer Neg Hx   . Liver disease Neg Hx   . Kidney disease Neg Hx   . Colon polyps Neg Hx   . Hypertension Sister   . Heart attack Brother     Social History:  reports that she has never smoked. She has never used smokeless tobacco. She reports that she does not drink alcohol or use illicit drugs.  Allergies:  Allergies  Allergen Reactions  . Aspirin Other (See Comments)    Mess up her stomach; "makes my bowels have blood in them". Takes 81 mg EC Aspirin   . Contrast Media [Iodinated Diagnostic Agents] Itching  . Iron Itching and Other (See Comments)    "they gave me  iron in dialysis; had to give me Benadryl cause I had to have the iron" (05/02/2012)  . Macrodantin [Nitrofurantoin Macrocrystal] Other (See Comments)    "broke me out in big old knots all over my body; had to go to ER"  . Penicillins Other (See Comments)    "makes me real weak when I take it; like I'll pass out"  . Plavix [Clopidogrel Bisulfate] Rash  . Amlodipine Swelling    Leg swelling  . Bactrim [Sulfamethoxazole-Trimethoprim] Rash  . Dexilant [Dexlansoprazole] Other (See Comments)    Upset stomach  . Morphine And Related Itching    Itching in feet  . Prilosec [Omeprazole] Other (See Comments)    "back  spasms"  . Sulfa Antibiotics Rash  . Venofer [Ferric Oxide] Itching    Patient reports using Benadryl prior to doses as [facility]  . Levaquin [Levofloxacin In D5w] Rash  . Protonix [Pantoprazole Sodium] Rash    Medications: I have reviewed the patient's current medications.  Results for orders placed or performed during the hospital encounter of 05/03/15 (from the past 48 hour(s))  APTT     Status: None   Collection Time: 05/03/15 11:58 AM  Result Value Ref Range   aPTT 35 24 - 37 seconds  CBC     Status: Abnormal   Collection Time: 05/03/15 11:58 AM  Result Value Ref Range   WBC 7.2 4.0 - 10.5 K/uL   RBC 3.69 (L) 3.87 - 5.11 MIL/uL   Hemoglobin 10.7 (L) 12.0 - 15.0 g/dL   HCT 35.6 (L) 36.0 - 46.0 %   MCV 96.5 78.0 - 100.0 fL   MCH 29.0 26.0 - 34.0 pg   MCHC 30.1 30.0 - 36.0 g/dL   RDW 18.1 (H) 11.5 - 15.5 %   Platelets 276 150 - 400 K/uL  Comprehensive metabolic panel     Status: Abnormal   Collection Time: 05/03/15 11:58 AM  Result Value Ref Range   Sodium 139 135 - 145 mmol/L   Potassium 5.1 3.5 - 5.1 mmol/L   Chloride 99 (L) 101 - 111 mmol/L   CO2 27 22 - 32 mmol/L   Glucose, Bld 120 (H) 65 - 99 mg/dL   BUN 46 (H) 6 - 20 mg/dL   Creatinine, Ser 9.96 (H) 0.44 - 1.00 mg/dL   Calcium 9.3 8.9 - 10.3 mg/dL   Total Protein 6.6 6.5 - 8.1 g/dL   Albumin 3.6  3.5 - 5.0 g/dL   AST 18 15 - 41 U/L   ALT 17 14 - 54 U/L   Alkaline Phosphatase 78 38 - 126 U/L   Total Bilirubin 0.6 0.3 - 1.2 mg/dL   GFR calc non Af Amer 3 (L) >60 mL/min   GFR calc Af Amer 4 (L) >60 mL/min    Comment: (NOTE) The eGFR has been calculated using the CKD EPI equation. This calculation has not been validated in all clinical situations. eGFR's persistently <60 mL/min signify possible Chronic Kidney Disease.    Anion gap 13 5 - 15  Protime-INR     Status: None   Collection Time: 05/03/15 11:58 AM  Result Value Ref Range   Prothrombin Time 14.9 11.6 - 15.2 seconds   INR 1.15 0.00 - 1.49  I-Stat Troponin, ED (not at [facility])     Status: None   Collection Time: 05/03/15 12:22 PM  Result Value Ref Range   Troponin i, poc 0.01 0.00 - 0.08 ng/mL   Comment 3            Comment: Due to the release kinetics of cTnI, a negative result within the first hours of the onset of symptoms does not rule out myocardial infarction with certainty. If myocardial infarction is still suspected, repeat the test at appropriate intervals.   I-stat troponin, ED     Status: None   Collection Time: 05/03/15  2:35 PM  Result Value Ref Range   Troponin i, poc 0.02 0.00 - 0.08 ng/mL   Comment 3            Comment: Due to the release kinetics of cTnI, a negative result within the first hours of the onset of symptoms does not rule out  myocardial infarction with certainty. If myocardial infarction is still suspected, repeat the test at appropriate intervals.   Troponin I     Status: None   Collection Time: 05/04/15 12:39 AM  Result Value Ref Range   Troponin I 0.03 <0.031 ng/mL    Comment:        NO INDICATION OF MYOCARDIAL INJURY.   Comprehensive metabolic panel     Status: Abnormal   Collection Time: 05/04/15  5:18 AM  Result Value Ref Range   Sodium 138 135 - 145 mmol/L   Potassium 5.3 (H) 3.5 - 5.1 mmol/L   Chloride 102 101 - 111 mmol/L   CO2 25 22 - 32 mmol/L   Glucose, Bld  119 (H) 65 - 99 mg/dL   BUN 57 (H) 6 - 20 mg/dL   Creatinine, Ser 11.52 (H) 0.44 - 1.00 mg/dL   Calcium 8.8 (L) 8.9 - 10.3 mg/dL   Total Protein 5.8 (L) 6.5 - 8.1 g/dL   Albumin 3.2 (L) 3.5 - 5.0 g/dL   AST 13 (L) 15 - 41 U/L   ALT 12 (L) 14 - 54 U/L   Alkaline Phosphatase 70 38 - 126 U/L   Total Bilirubin 0.6 0.3 - 1.2 mg/dL   GFR calc non Af Amer 3 (L) >60 mL/min   GFR calc Af Amer 3 (L) >60 mL/min    Comment: (NOTE) The eGFR has been calculated using the CKD EPI equation. This calculation has not been validated in all clinical situations. eGFR's persistently <60 mL/min signify possible Chronic Kidney Disease.    Anion gap 11 5 - 15  CBC     Status: Abnormal   Collection Time: 05/04/15  5:18 AM  Result Value Ref Range   WBC 7.4 4.0 - 10.5 K/uL   RBC 3.45 (L) 3.87 - 5.11 MIL/uL   Hemoglobin 9.9 (L) 12.0 - 15.0 g/dL   HCT 33.0 (L) 36.0 - 46.0 %   MCV 95.7 78.0 - 100.0 fL   MCH 28.7 26.0 - 34.0 pg   MCHC 30.0 30.0 - 36.0 g/dL   RDW 18.4 (H) 11.5 - 15.5 %   Platelets 261 150 - 400 K/uL  Troponin I     Status: Abnormal   Collection Time: 05/04/15  5:18 AM  Result Value Ref Range   Troponin I 0.05 (H) <0.031 ng/mL    Comment:        PERSISTENTLY INCREASED TROPONIN VALUES IN THE RANGE OF 0.04-0.49 ng/mL CAN BE SEEN IN:       -UNSTABLE ANGINA       -CONGESTIVE HEART FAILURE       -MYOCARDITIS       -CHEST TRAUMA       -ARRYHTHMIAS       -LATE PRESENTING MYOCARDIAL INFARCTION       -COPD   CLINICAL FOLLOW-UP RECOMMENDED.   Brain natriuretic peptide     Status: Abnormal   Collection Time: 05/04/15  5:18 AM  Result Value Ref Range   B Natriuretic Peptide 1041.0 (H) 0.0 - 100.0 pg/mL    Dg Chest 2 View  05/03/2015  CLINICAL DATA:  74 year old female with acute chest pain. Patient on dialysis and history of CABG. EXAM: CHEST  2 VIEW COMPARISON:  01/27/2015 and prior radiographs FINDINGS: Cardiomegaly and CABG changes again noted. Pulmonary vascular congestion noted. New  interstitial opacities likely represent mild interstitial edema. Trace bilateral pleural effusions noted. There is no evidence of pneumothorax or acute bony abnormality. IMPRESSION: Cardiomegaly with pulmonary  vascular congestion, probable mild interstitial edema and trace bilateral pleural effusions. Electronically Signed   By: Margarette Canada M.D.   On: 05/03/2015 12:22    Review of Systems  Constitutional: Negative for chills.  Respiratory: Positive for shortness of breath.   Cardiovascular: Positive for chest pain. Negative for leg swelling.  Gastrointestinal: Negative for nausea, vomiting and abdominal pain.   Blood pressure 157/71, pulse 75, temperature 98.1 F (36.7 C), temperature source Oral, resp. rate 18, height 5' 2"  (1.575 m), weight 139 lb 1.8 oz (63.1 kg), SpO2 97 %. Physical Exam  Constitutional: She is oriented to person, place, and time. No distress.  Eyes: No scleral icterus.  Neck: JVD present.  Cardiovascular: Normal rate and regular rhythm.   Respiratory: No respiratory distress. She has no wheezes. She has no rales.  GI: She exhibits no distension.  Musculoskeletal: She exhibits no edema.  Neurological: She is alert and oriented to person, place, and time.    Assessment/Plan Problem#1 chest pain: Presently patient is on nitro drip and feels much better. Her cardiac enzymes are normal. Problem #2-pulmonary edema: Possibly from uncontrolled hypertension. Presently patient is feeling much better. Problem #3 hypertension: Patient on nitroglycerin and also labetalol. Her blood pressure is high but much better. Patient had fluctuating blood pressure and at times cause below 120 and patient becomes symptomatic. He is very difficult to control. Patient is also inconsistent in taking her medication. Possibly there is an element of anxiety disorder. Problem #4 anemia: Her hemoglobin is below our target goal Problem #5 fluid management: Her fluid intake between dialysis days his  reasonable. Hence patient does not have significant sign of fluid overload. Problem #6 metabolic bone disease: Her calcium is range. Problem #7 End-stage renal disease: She is status post hemodialysis on Friday. Her potassium is 5.3 and patient is due for dialysis today. Plan: We'll make arrangements for patient to get dialysis today We'll dialyzer for 4 hours and try to remove 3 L if her blood pressure tolerates. We'll give her Epogen 10,000 units IV after each dialysis. We'll use to K/2.5 calcium bath.  Giancarlos Berendt S 05/04/2015, 9:35 AM

## 2015-05-04 NOTE — Care Management Note (Signed)
Case Management Note  Patient Details  Name: Shawna [name] MRN: [id number] Date of Birth: [birth year]  Subjective/Objective:                  Admitted with unstable angina. Pt is from home, lives with her husband. Ind with ADL's. Pt on HD M/W/F at [facility] in [place]. Pt lives in [place]. Pt has no HH services, DME needs or med needs prior to admission. Pt's husband provides transportation to and from HD.   Action/Plan: Pt plans to return home with self care at DC. No CM needs noted. Pt is hopefull she will DC home today after HD.   Expected Discharge Date:    05/05/2015              Expected Discharge Plan:  Home/Self Care  In-House Referral:  NA  Discharge planning Services  NA  Post Acute Care Choice:  NA Choice offered to:  NA  DME Arranged:    DME Agency:     HH Arranged:    HH Agency:     Status of Service:  Completed, signed off  Medicare Important Message Given:    Date Medicare IM Given:    Medicare IM give by:    Date Additional Medicare IM Given:    Additional Medicare Important Message give by:     If discussed at [place] of Stay Meetings, dates discussed:    Additional Comments:  Sherald Barge, RN 05/04/2015, 1:21 PM

## 2015-05-04 NOTE — Consult Note (Addendum)
CARDIOLOGY CONSULT NOTE   Patient ID: Shawna [name] MRN: [id number] DOB/AGE: 08/20/1940 74 y.o.  Admit Date: 05/03/2015 Referring Physician: PTH-Memon  Primary Physician: Shawna Percy, MD Consulting Cardiologist: Shawna Rob MD Primary Cardiologist: Shawna Sable MD [name]) Reason for Consultation: Unstable angina  Clinical Summary Shawna [name] is a 74 y.o.female with known history of coronary artery disease status post CABG, malignant hypertension, hyperlipidemia, chronic diastolic heart failure, and end-stage renal disease on hemodialysis. Presented to ER with complaints of chest pain and dyspnea.   She states that on Sunday morning she awoke with sharp pain behind her right shoulder blade. Felt bad. Got up to make breakfast and began to have sharp pain under her left breast, radiating into her left shoulder and down left arm. She took NTG X 3 over 15 minutes with minimal relief. Took two baby ASA with some improvement in symptoms. Began to have worsening breathing, Hard to take deep breath. States that her BP has been elevated over over the last month or two. Also states her weight has gone up. She states that they have been taking less off during dialysis due to cramping. Her normal dry wt is 131-132 lbs, but she is ranging at 137-138 lbs now. Last dialysis on Friday.   On arrival to ER BP was 208/86, HR 73, O2 Sat 97%, CXR Cardiomegaly with pulmonary vascular congestion, probable mildinterstitial edema and trace bilateral pleural effusions. EKG NSR with LVH, T-wave flattening in precordial lead V6. Creatinine 9.96, Troponin 0.03. She was treated with NTG gtt.  She is feeling some better but has headache. BP is improved but not optimal. She denies non-adherence to medical regimen or salt intake.   Allergies  Allergen Reactions  . Aspirin Other (See Comments)    Mess up her stomach; "makes my bowels have blood in them". Takes 81 mg EC Aspirin   . Contrast Media [Iodinated  Diagnostic Agents] Itching  . Iron Itching and Other (See Comments)    "they gave me iron in dialysis; had to give me Benadryl cause I had to have the iron" (05/02/2012)  . Macrodantin [Nitrofurantoin Macrocrystal] Other (See Comments)    "broke me out in big old knots all over my body; had to go to ER"  . Penicillins Other (See Comments)    "makes me real weak when I take it; like I'll pass out"  . Plavix [Clopidogrel Bisulfate] Rash  . Amlodipine Swelling    Leg swelling  . Bactrim [Sulfamethoxazole-Trimethoprim] Rash  . Dexilant [Dexlansoprazole] Other (See Comments)    Upset stomach  . Morphine And Related Itching    Itching in feet  . Prilosec [Omeprazole] Other (See Comments)    "back spasms"  . Sulfa Antibiotics Rash  . Venofer [Ferric Oxide] Itching    Patient reports using Benadryl prior to doses as [name]  . Levaquin [Levofloxacin In D5w] Rash  . Protonix [Pantoprazole Sodium] Rash    Medications Scheduled Medications: . ALPRAZolam  0.5 mg Oral TID  . aspirin EC  81 mg Oral Daily  . cinacalcet  30 mg Oral Q supper  . cloNIDine  0.3 mg Oral BID  . enoxaparin (LOVENOX) injection  30 mg Subcutaneous Q24H  . fluticasone  2 spray Each Nare Daily  . hydrALAZINE  50 mg Oral TID  . labetalol  200 mg Oral BID  . lanthanum  1,000 mg Oral TID PC  . loratadine  10 mg Oral Daily  . multivitamin  1 tablet Oral  Daily  . pantoprazole  40 mg Oral Daily  . simvastatin  20 mg Oral QHS  . sodium chloride  3 mL Intravenous Q12H    Infusions: . sodium chloride 10 mL/hr at 05/04/15 0600  . nitroGLYCERIN 85 mcg/min (05/04/15 0600)    PRN Medications: acetaminophen, albuterol, benzonatate, dextromethorphan-guaiFENesin, hydrALAZINE, naphazoline, nitroGLYCERIN, ondansetron **OR** ondansetron (ZOFRAN) IV, polyethylene glycol   Past Medical History  Diagnosis Date  . Chronic bronchitis ([facility]a [name])   . GERD (gastroesophageal reflux disease)   . PUD (peptic ulcer disease)   .  History of lower GI bleeding   . Arthritis   . History of gout   . CAD (coronary artery disease)     a. 12/2011 NSTEMI/Cath/PCI LCX (2.25x14 Resolute DES) & D1 (2.25x22 Resolute DES);  b. 01/2012 Cath/PCI: LM 30, LAD 30p, 40-68m, D1 stent ok, 99 in sm branch of diag, LCX patent stent, OM1 20, RCA 95 ost (4.0x12 Promus DES), EF 55%;  c. 04/2012 Lexi Cardiolite  EF 48%, small area of scar @ base/mid inflat wall with mild peri-infarct ischemia.; CABG 12/4  . High cholesterol 12/2011  . Pneumonia ~ 2009  . Iron deficiency anemia   . TIA (transient ischemic attack)   . Anxiety   . History of blood transfusion 07/2011; 12/2011; 01/2012 X 2; 04/2012  . Carotid artery disease (Shawna [name])     a. [id number] LICA, [id number]   . Mitral regurgitation     a. Moderate by echo, 02/2012  . Myocardial infarction (Shawna [name])   . Chronic diastolic CHF (congestive heart failure) ([facility]a [name])     a. 02/2012 Echo EF 60-65%, nl wall motion, Gr 1 DD, mod MR  . Hypertension   . AVM (arteriovenous malformation) of colon   . Esophageal stricture   . Ovarian cancer (Shawna [name]) 1992  . Colon cancer ([name]) 1992  . ESRD on hemodialysis (Shawna [name])     ESRD due to HTN, started dialysis 2011 and gets HD at [name] with [name] on MWF schedule.  Access is LUA AVF as of Sept 2014.     Past Surgical History  Procedure Laterality Date  . Abdominal hysterectomy  1992  . Appendectomy  06/1990  . Tubal ligation  1980's  . Av fistula placement  07/2009    left upper arm  . Thrombectomy / arteriovenous graft revision  2011    left upper arm  . Colon resection  1992  . Esophagogastroduodenoscopy  01/20/2012    Procedure: ESOPHAGOGASTRODUODENOSCOPY (EGD);  Surgeon: Shawna [name], Shawna [name];  [name];  Service: Endoscopy;  Laterality: N/A;  . Dilation and curettage of uterus    . Coronary angioplasty with stent placement  12/15/11    "2"  . Coronary angioplasty with stent placement  y/2013    "1; makes total of 3" (05/02/2012)  . Coronary artery  bypass graft  06/13/2012    Procedure: CORONARY ARTERY BYPASS GRAFTING (CABG);  Surgeon: Shawna Isaac, MD;  Location: [name];  Service: Open Heart Surgery;  Laterality: N/A;  cabg x four;  using left internal mammary artery, and left leg greater saphenous vein harvested endoscopically  . Intraoperative transesophageal echocardiogram  06/13/2012    Procedure: INTRAOPERATIVE TRANSESOPHAGEAL ECHOCARDIOGRAM;  Surgeon: Shawna Isaac, MD;  Location: [facility];  Service: Open Heart Surgery;  Laterality: N/A;  . Esophagogastroduodenoscopy N/A 03/26/2013    Procedure: ESOPHAGOGASTRODUODENOSCOPY (EGD);  Surgeon: Irene Shipper, MD;  Location: [name] ENDOSCOPY;  Service: Endoscopy;  Laterality: N/A;  . Ovary surgery  ovarian cancer  . Cardiac surgery    . Left heart catheterization with coronary angiogram N/A 12/15/2011    Procedure: LEFT HEART CATHETERIZATION WITH CORONARY ANGIOGRAM;  Surgeon: Burnell Blanks, MD;  Location: [name] CATH LAB;  Service: Cardiovascular;  Laterality: N/A;  . Left heart catheterization with coronary angiogram N/A 01/10/2012    Procedure: LEFT HEART CATHETERIZATION WITH CORONARY ANGIOGRAM;  Surgeon: Peter M Martinique, MD;  Location: [name] CATH LAB;  Service: Cardiovascular;  Laterality: N/A;  . Left heart catheterization with coronary angiogram N/A 06/08/2012    Procedure: LEFT HEART CATHETERIZATION WITH CORONARY ANGIOGRAM;  Surgeon: Burnell Blanks, MD;  Location: [name] CATH LAB;  Service: Cardiovascular;  Laterality: N/A;  . Shuntogram N/A 10/15/2013    Procedure: Fistulogram;  Surgeon: Serafina Mitchell, MD;  Location: [name] CATH LAB;  Service: Cardiovascular;  Laterality: N/A;  . Left heart catheterization with coronary/graft angiogram N/A 12/10/2013    Procedure: LEFT HEART CATHETERIZATION WITH Beatrix Fetters;  Surgeon: [name], MD;  Location: [name] CATH LAB;  Service: Cardiovascular;  Laterality: N/A;    Family History  Problem Relation Age of Onset  . Other       noncontributory for early CAD  . Heart disease Mother     Heart Disease before age 3  . Hyperlipidemia Mother   . Hypertension Mother   . Diabetes Mother   . Heart attack Mother   . Heart disease Father     Heart Disease before age 64  . Hyperlipidemia Father   . Hypertension Father   . Diabetes Father   . Diabetes Sister   . Hypertension Sister   . Diabetes Brother   . Hyperlipidemia Brother   . Heart attack Brother   . Colon cancer Neg Hx   . Esophageal cancer Neg Hx   . Liver disease Neg Hx   . Kidney disease Neg Hx   . Colon polyps Neg Hx   . Hypertension Sister   . Heart attack Brother     Social History Ms. Whan reports that she has never smoked. She has never used smokeless tobacco. Ms. Cianciulli reports that she does not drink alcohol.  Review of Systems Complete review of systems are found to be negative unless outlined in H&P above.  Physical Examination Blood pressure 157/71, pulse 74, temperature 99.2 F (37.3 C), temperature source Oral, resp. rate 19, height 5\' 2"  (1.575 m), weight 139 lb 1.8 oz (63.1 kg), SpO2 95 %.  Intake/Output Summary (Last 24 hours) at 05/04/15 0804 Last data filed at 05/04/15 0600  Gross per 24 hour  Intake 376.81 ml  Output      0 ml  Net 376.81 ml    Telemetry: NSR  GEN: No acute distress  HEENT: Conjunctiva and lids normal, oropharynx clear with moist mucosa. Neck: Supple, no elevated JVP or carotid bruits, no thyromegaly. Lungs: Some bibasilar crackles, nonlabored breathing at rest. Cardiac: Regular rate and rhythm, with S3 no significant systolic murmur, no pericardial rub. Abdomen: Soft, nontender, no hepatomegaly, bowel sounds present, no guarding or rebound. Extremities: No pitting edema, distal pulses 2+. Skin: Warm and dry. Musculoskeletal: No kyphosis. Neuropsychiatric: Alert and oriented x3, affect grossly appropriate.  Prior Cardiac Testing/Procedures 1. Stress test:  Normal nuclear cardiac stress test on  08/25/14, LVEF 57%.  2. Cardiac Cath: 12/10/2013 Patent left main coronary artery, focal significant disease in the mid to distal left anterior descending artery, patent diagonal branch, LIMA to LAD was widely patent, severe restenosis of the  proximal left circumflex artery stent, patent SVG to circumflex, 70% proximal ramus stenosis, widely patent SVG to ramus, occluded stent at the ostium of the right coronary artery, and widely patent SVG to distal RCA.  3. Echocardiogram 11/27/2013 Left ventricle: The cavity size was normal. Wall thickness was increased in a pattern of moderate LVH. Systolic function was normal. The estimated ejection fraction was in the range of 55% to 60%. Wall motion was normal; there were no regional wall motion abnormalities. Doppler parameters are consistent with abnormal left ventricular relaxation (grade 1 diastolic dysfunction). Doppler parameters are consistent with high ventricular filling pressure. - Aortic valve: Mildly calcified annulus. Trileaflet; mildly thickened leaflets. There was no stenosis. - Mitral valve: Mildly thickened leaflets . There was mild to moderate, posteriorly directed regurgitation. - Left atrium: The atrium was mildly dilated. - Tricuspid valve: There was mild regurgitation. - Pulmonary arteries: PA peak pressure: 41 mm Hg (S). Mildly elevated pulmonary pressures.  4. CABG 06/13/2012 CORONARY ARTERY BYPASS GRAFTING x 4 (Left internal mammary artery to left anterior descending, saphenous vein graft to distal right coronary artery, saphenous vein graft to intermediate, saphenous vein graft to circumflex) ENDOSCOPIC VEIN HARVEST LEFT LEG -  Lab Results  Basic Metabolic Panel:  Recent Labs Lab 05/03/15 1158  NA 139  K 5.1  CL 99*  CO2 27  GLUCOSE 120*  BUN 46*  CREATININE 9.96*  CALCIUM 9.3    Liver Function Tests:  Recent Labs Lab 05/03/15 1158  AST 18  ALT 17  ALKPHOS 78  BILITOT 0.6  PROT 6.6   ALBUMIN 3.6    CBC:  Recent Labs Lab 05/03/15 1158  WBC 7.2  HGB 10.7*  HCT 35.6*  MCV 96.5  PLT 276    Cardiac Enzymes:  Recent Labs Lab 05/04/15 0039  TROPONINI 0.03   Radiology: Dg Chest 2 View  05/03/2015  CLINICAL DATA:  74 year old female with acute chest pain. Patient on dialysis and history of CABG. EXAM: CHEST  2 VIEW COMPARISON:  01/27/2015 and prior radiographs FINDINGS: Cardiomegaly and CABG changes again noted. Pulmonary vascular congestion noted. New interstitial opacities likely represent mild interstitial edema. Trace bilateral pleural effusions noted. There is no evidence of pneumothorax or acute bony abnormality. IMPRESSION: Cardiomegaly with pulmonary vascular congestion, probable mild interstitial edema and trace bilateral pleural effusions. Electronically Signed   By: Margarette Canada M.D.   On: 05/03/2015 12:22     ECG: NSR with LVH. Rate of 78 bpm.    Impression and Recommendations  1. Hypertensive Emergency: Denies non-adherence with diet and medications. States that her BP has been running in the 170/90's. She is now better controlled on NTG gtt but has complaints of headache. She has been restarted on home medications that include clonidine 0.3 mg BID, labetalol 200 mg BID, hydralazine 50 mg TID. She may need to have increase in hydralazine to 75 mg TID for better control. She has not yet had dialysis since admission. Will increase hydralazine and try to wean down NTG. Add back nitrates po, isosorbide 20 mg TID. Will repeat echo to evaluate for changes in LV size and function.   2. CAD: Hx of CABG: Chest pain has resolved. Troponin negative. No evidence of ACS currently. Continue ASA, statin.   3. Mild CHF: Dialysis should assist with this. No complaints of edema or abdominal distention.   Signed: Phill Myron. Lawrence NP [place]  05/04/2015, 8:04 AM Co-Sign MD  The patient was seen and examined, and I agree with the  assessment and plan as documented  above, with modifications as noted below. Pt very well known to me from clinic admitted with hypertensive urgency and consequent chest pain and mild pulmonary edema.  Started on nitro drip and feeling much better. BP's have been particularly elevated in the evening and mornings upon awakening as per patient. Has ruled out for an ACS. BP's have been difficult to control in outpatient setting. Did not tolerate amlodipine due to pedal edema. Agree with increasing hydralazine to 75 mg tid and potentially labetalol if need be.  I do not feel an echocardiogram will add much to management at this time. Discussed treatment plan with patient and she is in agreement.  Shawna Sable, MD

## 2015-05-05 DIAGNOSIS — R0789 Other chest pain: Secondary | ICD-10-CM

## 2015-05-05 LAB — CBC
HCT: 36.6 % (ref 36.0–46.0)
Hemoglobin: 10.9 g/dL — ABNORMAL LOW (ref 12.0–15.0)
MCH: 28.6 pg (ref 26.0–34.0)
MCHC: 29.8 g/dL — ABNORMAL LOW (ref 30.0–36.0)
MCV: 96.1 fL (ref 78.0–100.0)
PLATELETS: 255 10*3/uL (ref 150–400)
RBC: 3.81 MIL/uL — ABNORMAL LOW (ref 3.87–5.11)
RDW: 18.4 % — AB (ref 11.5–15.5)
WBC: 6 10*3/uL (ref 4.0–10.5)

## 2015-05-05 LAB — BASIC METABOLIC PANEL
Anion gap: 12 (ref 5–15)
BUN: 24 mg/dL — AB (ref 6–20)
CHLORIDE: 96 mmol/L — AB (ref 101–111)
CO2: 29 mmol/L (ref 22–32)
CREATININE: 6.21 mg/dL — AB (ref 0.44–1.00)
Calcium: 8.6 mg/dL — ABNORMAL LOW (ref 8.9–10.3)
GFR calc Af Amer: 7 mL/min — ABNORMAL LOW (ref >60)
GFR, EST NON AFRICAN AMERICAN: 6 mL/min — AB (ref >60)
Glucose, Bld: 117 mg/dL — ABNORMAL HIGH (ref 65–99)
Potassium: 4 mmol/L (ref 3.5–5.1)
SODIUM: 137 mmol/L (ref 135–145)

## 2015-05-05 LAB — HEPATITIS B SURFACE ANTIGEN: HEP B S AG: NEGATIVE

## 2015-05-05 LAB — HEPATITIS B SURFACE ANTIBODY,QUALITATIVE: HEP B S AB: REACTIVE

## 2015-05-05 MED ORDER — HYDRALAZINE HCL 50 MG PO TABS: 75.0000 mg | ORAL_TABLET | Freq: Three times a day (TID) | ORAL | Status: DC

## 2015-05-05 MED ORDER — LABETALOL HCL 200 MG PO TABS
300.0000 mg | ORAL_TABLET | Freq: Two times a day (BID) | ORAL | Status: DC
  Administered 2015-05-05: 300 mg via ORAL
  Filled 2015-05-05: qty 2

## 2015-05-05 MED ORDER — LABETALOL HCL 200 MG PO TABS: 300.0000 mg | ORAL_TABLET | Freq: Two times a day (BID) | ORAL | Status: DC

## 2015-05-05 NOTE — Discharge Summary (Signed)
Physician Discharge Summary  MYSTICAL STORZ [id number] DOB: October 13, 1940 DOA: 05/03/2015  PCP: [name], MD  Admit date: 05/03/2015 Discharge date: 05/05/2015  Time spent: 25 minutes  Recommendations for Outpatient Follow-up:  1. Follow up with dialysis center 10/26 as regularly scheduled 2. Follow up with cardiology in 2 weeks.    Discharge Diagnoses:  Active Problems:   Coronary atherosclerosis of native coronary artery   Essential hypertension, benign   HLD (hyperlipidemia)   Chest pain   ESRD (end stage renal disease) (H[facility])   Hypertensive urgency   Acute on chronic diastolic CHF (congestive heart failure) ([place])   Discharge Condition: Improved  Diet recommendation: Heart healthy   Filed Weights   05/04/15 0500 05/04/15 1145 05/05/15 0346  Weight: 63.1 kg (139 lb 1.8 oz) 63.3 kg (139 lb 8.8 oz) 61.2 kg (134 lb 14.7 oz)    History of present illness:  74 year old female with history of CABG in 2013ESRD on HD presented with intermittent chest pain and dyspnea. While in the ED she was noted to have possible CHF and chest pain. Admitted for further evaluation and monitoring.   Hospital Course:  Chest pain was possibly related to volume overload/severe hypertension. She was started on NTG drip which improved her blood pressure as well as her chest pain. She subsequently underwent dialysis with further improvement of her symptoms. NTG was weaned off and antihypertensives were adjusted. Blood pressure is improved and she is not having any further chest pain. ACS was ruled out with negative cardiac markers.    1. Acute on chronic diastolic CHF. Noted on CXR. HD conducted 10/24. Nephrology consult appreciated. Symptoms have improved after dialysis and fluid removal. 2. Hypertensive urgency. She briefly required NTG infusion on admission. Hydralazine and labetalol doses have been increased. Blood pressure improving and she will follow up with cardiology as outpatient. 3.    History of CAD and CABG. No further chest pains and cardiac enzymes are negative. EKG did not shown any acute changes. Will continue ASA.  4. ESRD. Dialysis 10/24. 5. GERD. Will continue PPI 6. HLD. Continue statin.  Procedures:  HD 10/24  Consultations:  Cardiology  Nephrology  Discharge Exam: Danley Danker Vitals:   05/05/15 0800  BP: 179/107  Pulse: 78  Temp:   Resp: 12     General: NAD, looks comfortable  Cardiovascular: RRR, S1, S2   Respiratory: clear bilaterally, No wheezing, rales or rhonchi  Abdomen: soft, non tender, no distention , bowel sounds normal  Musculoskeletal: No edema b/l  Discharge Instructions   Discharge Instructions    Diet - low sodium heart healthy    Complete by:  As directed      Increase activity slowly    Complete by:  As directed           Current Discharge Medication List    CONTINUE these medications which have CHANGED   Details  hydrALAZINE (APRESOLINE) 50 MG tablet Take 1.5 tablets (75 mg total) by mouth 3 (three) times daily. Qty: 90 tablet, Refills: 1    labetalol (NORMODYNE) 200 MG tablet Take 1.5 tablets (300 mg total) by mouth 2 (two) times daily. Qty: 60 tablet, Refills: 1      CONTINUE these medications which have NOT CHANGED   Details  acetaminophen (TYLENOL) 325 MG tablet Take 2 tablets (650 mg total) by mouth every 6 (six) hours as needed for mild pain (mild pain).    ALPRAZolam (XANAX) 0.5 MG tablet Take 1 tablet (0.5 mg total)  by mouth 3 (three) times daily. Qty: 30 tablet, Refills: 1    aspirin EC 81 MG tablet Take 81 mg by mouth daily.     benzonatate (TESSALON PERLES) 100 MG capsule Take 1 capsule (100 mg total) by mouth 3 (three) times daily as needed for cough. Qty: 20 capsule, Refills: 0    cloNIDine (CATAPRES) 0.3 MG tablet Take 1 tablet (0.3 mg total) by mouth 2 (two) times daily. Qty: 60 tablet, Refills: 6    dextromethorphan-guaiFENesin (MUCINEX DM) 30-600 MG per 12 hr tablet Take 1 tablet by  mouth daily. Qty: 14 tablet, Refills: 1    fluticasone (FLONASE) 50 MCG/ACT nasal spray Place 2 sprays into the nose daily.     folic acid-vitamin b complex-vitamin c-selenium-zinc (DIALYVITE) 3 MG TABS Take 1 tablet by mouth daily.    isosorbide dinitrate (ISORDIL) 20 MG tablet Take 20 mg by mouth 3 (three) times daily.    lanthanum (FOSRENOL) 1000 MG chewable tablet Chew 1,000 mg by mouth 3 (three) times daily after meals.    lidocaine-prilocaine (EMLA) cream Apply 1 application topically daily as needed (for port access).     loratadine (CLARITIN) 10 MG tablet Take 10 mg by mouth daily.     nitroGLYCERIN (NITROSTAT) 0.4 MG SL tablet Place 1 tablet (0.4 mg total) under the tongue every 5 (five) minutes x 3 doses as needed for chest pain. Qty: 25 tablet, Refills: 3    omeprazole (PRILOSEC) 20 MG capsule TAKE ONE CAPSULE BY MOUTH ONCE DAILY Qty: 30 capsule, Refills: 0    polyethylene glycol (MIRALAX / GLYCOLAX) packet Take 17 g by mouth daily. Qty: 14 each, Refills: 0    PROAIR HFA 108 (90 BASE) MCG/ACT inhaler Inhale 1 puff into the lungs every 6 (six) hours as needed for wheezing or shortness of breath.     SENSIPAR 30 MG tablet Take 30 mg by mouth daily with supper.     simvastatin (ZOCOR) 20 MG tablet Take 1 tablet (20 mg total) by mouth at bedtime. Qty: 30 tablet, Refills: 3    tetrahydrozoline 0.05 % ophthalmic solution Place 1 drop into both eyes 2 (two) times daily as needed (irritation).       Allergies  Allergen Reactions  . Aspirin Other (See Comments)    Mess up her stomach; "makes my bowels have blood in them". Takes 81 mg EC Aspirin   . Contrast Media [Iodinated Diagnostic Agents] Itching  . Iron Itching and Other (See Comments)    "they gave me iron in dialysis; had to give me Benadryl cause I had to have the iron" (05/02/2012)  . Macrodantin [Nitrofurantoin Macrocrystal] Other (See Comments)    "broke me out in big old knots all over my body; had to go to  ER"  . Penicillins Other (See Comments)    "makes me real weak when I take it; like I'll pass out"  . Plavix [Clopidogrel Bisulfate] Rash  . Amlodipine Swelling    Leg swelling  . Bactrim [Sulfamethoxazole-Trimethoprim] Rash  . Dexilant [Dexlansoprazole] Other (See Comments)    Upset stomach  . Morphine And Related Itching    Itching in feet  . Prilosec [Omeprazole] Other (See Comments)    "back spasms"  . Sulfa Antibiotics Rash  . Venofer [Ferric Oxide] Itching    Patient reports using Benadryl prior to doses as [facility]  . Levaquin [Levofloxacin In D5w] Rash  . Protonix [Pantoprazole Sodium] Rash   Follow-up Information    Follow up  with [name], MD. Call in 2 days.   Specialty:  Family Medicine   Contact information:   [place] [address] [phone]       Follow up with Herminio Commons, MD. Schedule an appointment as soon as possible for a visit in 2 days.   Specialty:  Cardiology   Contact information:   [place] [address] [phone]        The results of significant diagnostics from this hospitalization (including imaging, microbiology, ancillary and laboratory) are listed below for reference.    Significant Diagnostic Studies: Dg Chest 2 View  05/03/2015  CLINICAL DATA:  74 year old female with acute chest pain. Patient on dialysis and history of CABG. EXAM: CHEST  2 VIEW COMPARISON:  01/27/2015 and prior radiographs FINDINGS: Cardiomegaly and CABG changes again noted. Pulmonary vascular congestion noted. New interstitial opacities likely represent mild interstitial edema. Trace bilateral pleural effusions noted. There is no evidence of pneumothorax or acute bony abnormality. IMPRESSION: Cardiomegaly with pulmonary vascular congestion, probable mild interstitial edema and trace bilateral pleural effusions. Electronically Signed   By: Margarette Canada M.D.   On: 05/03/2015 12:22    Microbiology: No results found for this or  any previous visit (from the past 240 hour(s)).   Labs: Basic Metabolic Panel:  Recent Labs Lab 05/03/15 1158 05/04/15 0518 05/05/15 0350  NA 139 138 137  K 5.1 5.3* 4.0  CL 99* 102 96*  CO2 27 25 29   GLUCOSE 120* 119* 117*  BUN 46* 57* 24*  CREATININE 9.96* 11.52* 6.21*  CALCIUM 9.3 8.8* 8.6*   Liver Function Tests:  Recent Labs Lab 05/03/15 1158 05/04/15 0518  AST 18 13*  ALT 17 12*  ALKPHOS 78 70  BILITOT 0.6 0.6  PROT 6.6 5.8*  ALBUMIN 3.6 3.2*   CBC:  Recent Labs Lab 05/03/15 1158 05/04/15 0518 05/05/15 0350  WBC 7.2 7.4 6.0  HGB 10.7* 9.9* 10.9*  HCT 35.6* 33.0* 36.6  MCV 96.5 95.7 96.1  PLT 276 261 255   Cardiac Enzymes:  Recent Labs Lab 05/04/15 0039 05/04/15 0518  TROPONINI 0.03 0.05*   BNP: BNP (last 3 results)  Recent Labs  05/04/15 0518  BNP 1041.0*     Signed:  Kathie Dike, MD  [facility] 05/05/2015, 10:42 AM   By signing my name below, I, Rennis Harding, attest that this documentation has been prepared under the direction and in the presence of Kathie Dike, MD. Electronically signed: Rennis Harding, Scribe. 05/05/2015 9:50 AM   I, Dr. Kathie Dike, personally performed the services described in this documentaiton. All medical record entries made by the scribe were at my direction and in my presence. I have reviewed the chart and agree that the record reflects my personal performance and is accurate and complete  Kathie Dike, MD, 05/05/2015 10:42 AM

## 2015-05-05 NOTE — Care Management Note (Signed)
Case Management Note  Patient Details  Name: Shawna [name] MRN: [id number] Date of Birth: 1941-03-08  Expected Discharge Date:                  Expected Discharge Plan:  Home/Self Care  In-House Referral:  NA  Discharge planning Services  NA  Post Acute Care Choice:  NA Choice offered to:  NA  DME Arranged:    DME Agency:     HH Arranged:    [place] Agency:     Status of Service:  Completed, signed off  Medicare Important Message Given:  N/A - LOS <3 / Initial given by admissions Date Medicare IM Given:    Medicare IM give by:    Date Additional Medicare IM Given:    Additional Medicare Important Message give by:     If discussed at [place] of Stay Meetings, dates discussed:    Additional Comments: Pt discharging home with self care today. No CM needs.  Sherald Barge, RN 05/05/2015, 11:39 AM

## 2015-05-05 NOTE — Progress Notes (Signed)
DISCHARGE INSTRUCTIONS GIVEN. RT HAND IV D/C'D. HR IN SR. PT DENIES ANY DISCOMFORT OR DISTRESS.. NO SOB. LEFT  DIALYSIS SHUNT WNL.PT IS GOING TO EAT LUNCH BEFORE SHE GOES HOME ACCOMPANED BY HUSBAND WHO IS AT BEDSIDE.

## 2015-05-05 NOTE — Progress Notes (Signed)
Subjective: Patient feels much better. She doesn't have any chest pain and no difficulty in breathing. She complains of headache when she was on nitro drip but presently is better.   Objective: Vital signs in last 24 hours: Temp:  [97.4 F (36.3 C)-98.3 F (36.8 C)] 98.3 F (36.8 C) (10/25 0346) Pulse Rate:  [70-85] 76 (10/25 0630) Resp:  [12-27] 25 (10/25 0630) BP: (104-199)/(46-94) 163/71 mmHg (10/25 0630) SpO2:  [92 %-97 %] 94 % (10/25 0630) Weight:  [134 lb 14.7 oz (61.2 kg)-139 lb 8.8 oz (63.3 kg)] 134 lb 14.7 oz (61.2 kg) (10/25 0346)  Intake/Output from previous day: 10/24 0701 - 10/25 0700 In: 641 [P.O.:480; I.V.:161] Out: 3100  Intake/Output this shift:     Recent Labs  05/03/15 1158 05/04/15 0518 05/05/15 0350  HGB 10.7* 9.9* 10.9*    Recent Labs  05/04/15 0518 05/05/15 0350  WBC 7.4 6.0  RBC 3.45* 3.81*  HCT 33.0* 36.6  PLT 261 255    Recent Labs  05/04/15 0518 05/05/15 0350  NA 138 137  K 5.3* 4.0  CL 102 96*  CO2 25 29  BUN 57* 24*  CREATININE 11.52* 6.21*  GLUCOSE 119* 117*  CALCIUM 8.8* 8.6*    Recent Labs  05/03/15 1158  INR 1.15    Generally patient is alert and in no apparent distress Chest is clear to auscultation Heart exam regular rate and rhythm no murmur or S3 Extremities no edema  Assessment/Plan: Problem #1 chest pain: Presently she is a symptomatic. Patient is admitted for unstable angina. Her troponin is within normal range. Problem #2 end-stage renal disease: She is status post hemodialysis yesterday. She didn't have any uremic signs and symptoms and her potassium is good. Problem #3 hypertension: Her blood pressure is better controlled. Still fluctuating. Her systolic blood pressure ranged between 104 to 199. Problem #4 anemia: Her hemoglobin has gone up to her target range Problem #5 fluid management: Patient doesn't have any significant sign of fluid overload Problem #6 metabolic bone disease calcium is in  range. Plan: We'll continue his present management We'll make arrangements for patient to get dialysis tomorrow which is her regular schedule. If patient is going to be discharged should go to her regular unit.   Kervin Bones S 05/05/2015, 7:38 AM

## 2015-05-05 NOTE — Care Management Important Message (Signed)
Important Message  Patient Details  Name: Shawna [name] MRN: [id number] Date of Birth: 06-18-1941   Medicare Important Message Given:  N/A - LOS <3 / Initial given by admissions    Sherald Barge, RN 05/05/2015, 11:39 AM

## 2015-05-06 ENCOUNTER — Encounter: Payer: Self-pay | Admitting: Physician Assistant

## 2015-05-06 ENCOUNTER — Encounter: Payer: Medicare Other | Admitting: Cardiovascular Disease

## 2015-05-06 ENCOUNTER — Ambulatory Visit (INDEPENDENT_AMBULATORY_CARE_PROVIDER_SITE_OTHER): Payer: Medicare Other | Admitting: Physician Assistant

## 2015-05-06 VITALS — BP 136/78 | HR 84 | Wt 135.6 lb

## 2015-05-06 DIAGNOSIS — I10 Essential (primary) hypertension: Secondary | ICD-10-CM | POA: Diagnosis not present

## 2015-05-06 DIAGNOSIS — I5032 Chronic diastolic (congestive) heart failure: Secondary | ICD-10-CM | POA: Diagnosis not present

## 2015-05-06 DIAGNOSIS — I2510 Atherosclerotic heart disease of native coronary artery without angina pectoris: Secondary | ICD-10-CM

## 2015-05-06 NOTE — Patient Instructions (Addendum)
Your physician recommends that you schedule a follow-up appointment in: 4 Weeks with Dr. Bronson Ing.  Your physician recommends that you continue on your current medications as directed. Please refer to the Current Medication list given to you today.  If you need a refill on your cardiac medications before your next appointment, please call your pharmacy.  Please pick up medications at pharmacy.   Thank you for choosing [facility]!

## 2015-05-06 NOTE — Assessment & Plan Note (Signed)
Heart failure compensated 

## 2015-05-06 NOTE — Progress Notes (Signed)
Cardiology Office Note   Date:  05/06/2015   ID:  Shawna [name], DOB October 05, 1940, MRN [id number]  PCP:  [name], MD  Cardiologist:  Dr. Bronson Ing  Chief Complaint: post hospital follow-up , elevated blood pressure    History of Present Illness: Shawna [name] is a 74 y.o. female who presents for post hospital follow-up after an admission with hypertensive emergency and consequent chest pain and mild pulmonary edema 10/23-10/25/16. She was treated with nitroglycerin drip and ruled out for ACS. She can't tolerate amlodipine due to pedal edema and her hydralazine was increased to 75 mg 3 times a day. Labetalol increased to 300 mg twice a day. She was given dialysis which improved her heart failure.   Patient comes in today after being discharged yesterday. She has not filled her prescriptions for her increased medications yet. She says her blood pressure was high this morning but she just came from dialysis and it's much lower. She is feeling well without cardiac complaints. No chest pain, palpitations, dyspnea, dyspnea on exertion, dizziness or presyncope. She does complain of occasional groin pain at cath site from last year that shoots up into her abdomen and asked me to check it.  She has a history of coronary artery disease status post CABG, malignant hypertension, hyperlipidemia, chronic diastolic heart failure, and end-stage renal disease on hemodialysis.   She had a normal nuclear cardiac stress test on 08/25/14, LVEF 57%. Coronary angiography on 12/10/13 demonstrated a patent left main coronary artery, focal significant disease in the mid to distal left anterior descending artery, patent diagonal branch, LIMA to LAD was widely patent, severe restenosis of the proximal left circumflex artery stent, patent SVG to circumflex, 70% proximal ramus stenosis, widely patent SVG to ramus, occluded stent at the ostium of the right coronary artery, and widely patent SVG to distal RCA.  Echocardiogram  on 11/27/13 demonstrated normal left ventricular systolic function, EF 0000000, moderate LVH, grade 1 diastolic dysfunction, elevated filling pressures, and mild to moderate mitral regurgitation.  Prior hospitalization for acute hypoxic respiratory failure in the setting of acute encephalopathy. She also had hypertensive emergency.  She underwent mechanical and pharmacologic thrombectomy /thrombolysis of left upper extremity brachiocephalic fistula with balloon angioplasty of tandem stenoses of the outflow.         Past Medical History  Diagnosis Date  . Chronic bronchitis ([place])   . GERD (gastroesophageal reflux disease)   . PUD (peptic ulcer disease)   . History of lower GI bleeding   . Arthritis   . History of gout   . CAD (coronary artery disease)     a. 12/2011 NSTEMI/Cath/PCI LCX (2.25x14 Resolute DES) & D1 (2.25x22 Resolute DES);  b. 01/2012 Cath/PCI: LM 30, LAD 30p, 40-61m, D1 stent ok, 99 in sm branch of diag, LCX patent stent, OM1 20, RCA 95 ost (4.0x12 Promus DES), EF 55%;  c. 04/2012 Lexi Cardiolite  EF 48%, small area of scar @ base/mid inflat wall with mild peri-infarct ischemia.; CABG 12/4  . High cholesterol 12/2011  . Pneumonia ~ 2009  . Iron deficiency anemia   . TIA (transient ischemic attack)   . Anxiety   . History of blood transfusion 07/2011; 12/2011; 01/2012 X 2; 04/2012  . Carotid artery disease ([place])     a. [id number] LICA, [id number]   . Mitral regurgitation     a. Moderate by echo, 02/2012  . Myocardial infarction ([place])   . Chronic diastolic CHF (congestive heart failure) ([place])  a. 02/2012 Echo EF 60-65%, nl wall motion, Gr 1 DD, mod MR  . Hypertension   . AVM (arteriovenous malformation) of colon   . Esophageal stricture   . Ovarian cancer ([place]) 1992  . Colon cancer ([place]) 1992  . ESRD on hemodialysis ([place])     ESRD due to HTN, started dialysis 2011 and gets HD at [facility] with Dr Hinda Lenis on MWF schedule.  Access is LUA AVF as of Sept 2014.     Past  Surgical History  Procedure Laterality Date  . Abdominal hysterectomy  1992  . Appendectomy  06/1990  . Tubal ligation  1980's  . Av fistula placement  07/2009    left upper arm  . Thrombectomy / arteriovenous graft revision  2011    left upper arm  . Colon resection  1992  . Esophagogastroduodenoscopy  01/20/2012    Procedure: ESOPHAGOGASTRODUODENOSCOPY (EGD);  Surgeon: Ladene Artist, MD,FACG;  Location: [facility] ENDOSCOPY;  Service: Endoscopy;  Laterality: N/A;  . Dilation and curettage of uterus    . Coronary angioplasty with stent placement  12/15/11    "2"  . Coronary angioplasty with stent placement  y/2013    "1; makes total of 3" (05/02/2012)  . Coronary artery bypass graft  06/13/2012    Procedure: CORONARY ARTERY BYPASS GRAFTING (CABG);  Surgeon: Grace Isaac, MD;  Location: [facility];  Service: Open Heart Surgery;  Laterality: N/A;  cabg x four;  using left internal mammary artery, and left leg greater saphenous vein harvested endoscopically  . Intraoperative transesophageal echocardiogram  06/13/2012    Procedure: INTRAOPERATIVE TRANSESOPHAGEAL ECHOCARDIOGRAM;  Surgeon: Grace Isaac, MD;  Location: [facility];  Service: Open Heart Surgery;  Laterality: N/A;  . Esophagogastroduodenoscopy N/A 03/26/2013    Procedure: ESOPHAGOGASTRODUODENOSCOPY (EGD);  Surgeon: Irene Shipper, MD;  Location: [facility] ENDOSCOPY;  Service: Endoscopy;  Laterality: N/A;  . [place] surgery      ovarian cancer  . Cardiac surgery    . Left heart catheterization with coronary angiogram N/A 12/15/2011    Procedure: LEFT HEART CATHETERIZATION WITH CORONARY ANGIOGRAM;  Surgeon: Burnell Blanks, MD;  Location: [facility] CATH LAB;  Service: Cardiovascular;  Laterality: N/A;  . Left heart catheterization with coronary angiogram N/A 01/10/2012    Procedure: LEFT HEART CATHETERIZATION WITH CORONARY ANGIOGRAM;  Surgeon: Peter M Martinique, MD;  Location: [facility] CATH LAB;  Service: Cardiovascular;  Laterality: N/A;  . Left heart  catheterization with coronary angiogram N/A 06/08/2012    Procedure: LEFT HEART CATHETERIZATION WITH CORONARY ANGIOGRAM;  Surgeon: Burnell Blanks, MD;  Location: [facility] CATH LAB;  Service: Cardiovascular;  Laterality: N/A;  . Shuntogram N/A 10/15/2013    Procedure: Fistulogram;  Surgeon: Serafina Mitchell, MD;  Location: [facility] CATH LAB;  Service: Cardiovascular;  Laterality: N/A;  . Left heart catheterization with coronary/graft angiogram N/A 12/10/2013    Procedure: LEFT HEART CATHETERIZATION WITH Beatrix Fetters;  Surgeon: [name], MD;  Location: [facility] CATH LAB;  Service: Cardiovascular;  Laterality: N/A;  . Esophagogastroduodenoscopy N/A 04/30/2015    Procedure: ESOPHAGOGASTRODUODENOSCOPY (EGD);  Surgeon: Rogene Houston, MD;  Location: [facility] ENDO SUITE;  Service: Endoscopy;  Laterality: N/A;  1pm - moved to 10/20 @ 1:10     Current Outpatient Prescriptions  Medication Sig Dispense Refill  . acetaminophen (TYLENOL) 325 MG tablet Take 2 tablets (650 mg total) by mouth every 6 (six) hours as needed for mild pain (mild pain).    Marland Kitchen ALPRAZolam (XANAX) 0.5 MG tablet Take 1  tablet (0.5 mg total) by mouth 3 (three) times daily. 30 tablet 1  . aspirin EC 81 MG tablet Take 81 mg by mouth daily.     . benzonatate (TESSALON PERLES) 100 MG capsule Take 1 capsule (100 mg total) by mouth 3 (three) times daily as needed for cough. 20 capsule 0  . cloNIDine (CATAPRES) 0.3 MG tablet Take 1 tablet (0.3 mg total) by mouth 2 (two) times daily. 60 tablet 6  . dextromethorphan-guaiFENesin (MUCINEX DM) 30-600 MG per 12 hr tablet Take 1 tablet by mouth daily. (Patient taking differently: Take 1 tablet by mouth 3 (three) times daily as needed. ) 14 tablet 1  . fluticasone (FLONASE) 50 MCG/ACT nasal spray Place 2 sprays into the nose daily.     . folic acid-vitamin b complex-vitamin c-selenium-zinc (DIALYVITE) 3 MG TABS Take 1 tablet by mouth daily.    . hydrALAZINE (APRESOLINE) 50 MG tablet Take 1.5 tablets  (75 mg total) by mouth 3 (three) times daily. 90 tablet 1  . isosorbide dinitrate (ISORDIL) 20 MG tablet Take 20 mg by mouth 3 (three) times daily.    Marland Kitchen labetalol (NORMODYNE) 200 MG tablet Take 1.5 tablets (300 mg total) by mouth 2 (two) times daily. 60 tablet 1  . lanthanum (FOSRENOL) 1000 MG chewable tablet Chew 1,000 mg by mouth 3 (three) times daily after meals.    . lidocaine-prilocaine (EMLA) cream Apply 1 application topically daily as needed (for port access).     Marland Kitchen loratadine (CLARITIN) 10 MG tablet Take 10 mg by mouth daily.     . nitroGLYCERIN (NITROSTAT) 0.4 MG SL tablet Place 1 tablet (0.4 mg total) under the tongue every 5 (five) minutes x 3 doses as needed for chest pain. 25 tablet 3  . omeprazole (PRILOSEC) 20 MG capsule TAKE ONE CAPSULE BY MOUTH ONCE DAILY 30 capsule 0  . polyethylene glycol (MIRALAX / GLYCOLAX) packet Take 17 g by mouth daily. (Patient taking differently: Take 17 g by mouth daily as needed for mild constipation or moderate constipation. ) 14 each 0  . PROAIR HFA 108 (90 BASE) MCG/ACT inhaler Inhale 1 puff into the lungs every 6 (six) hours as needed for wheezing or shortness of breath.     . SENSIPAR 30 MG tablet Take 30 mg by mouth daily with supper.     . simvastatin (ZOCOR) 20 MG tablet Take 1 tablet (20 mg total) by mouth at bedtime. 30 tablet 3  . tetrahydrozoline 0.05 % ophthalmic solution Place 1 drop into both eyes 2 (two) times daily as needed (irritation).     No current facility-administered medications for this visit.    Allergies:   Aspirin; Contrast media; Iron; Macrodantin; Penicillins; Plavix; Amlodipine; Bactrim; Dexilant; Morphine and related; Prilosec; Sulfa antibiotics; Venofer; Levaquin; and Protonix    Social History:  The patient  reports that she has never smoked. She has never used smokeless tobacco. She reports that she does not drink alcohol or use illicit drugs.   Family History:  The patient's    family history includes Diabetes in  her brother, father, mother, and sister; Heart attack in her brother, brother, and mother; Heart disease in her father and mother; Hyperlipidemia in her brother, father, and mother; Hypertension in her father, mother, sister, and sister; Other in an other family member. There is no history of Colon cancer, Esophageal cancer, Liver disease, Kidney disease, or Colon polyps.    ROS:  Please see the history of present illness.   Otherwise, review  of systems are positive for none.   All other systems are reviewed and negative.    PHYSICAL EXAM: VS:  BP 136/78 mmHg  Pulse 84  Wt 135 lb 9.6 oz (61.508 kg)  SpO2 94% , BMI Body mass index is 24.8 kg/(m^2). GEN: Well nourished, well developed, in no acute distress Neck: no JVD, HJR, carotid bruits, or masses Cardiac: RRR;  2/6 systolic murmur at the left sternal border, 2/6 diastolic murmur at the right sternal border, positive S4, rubs, thrill or heave,  Respiratory:  clear to auscultation bilaterally, normal work of breathing GI: soft, nontender, nondistended, + BS MS: no deformity or atrophy Extremities: right groin cath site without hematoma or hemorrhage or widening pulse no bruit, no tenderness , otherwise lower extremities without cyanosis, clubbing, edema, good distal pulses bilaterally.  Skin: warm and dry, no rash Neuro:  Strength and sensation are intact    EKG:  EKG is not ordered today.    Recent Labs: 08/24/2014: TSH 2.864 01/22/2015: Magnesium 1.7 05/04/2015: ALT 12*; B Natriuretic Peptide 1041.0* 05/05/2015: BUN 24*; Creatinine, Ser 6.21*; Hemoglobin 10.9*; Platelets 255; Potassium 4.0; Sodium 137    Lipid Panel    Component Value Date/Time   CHOL 129 07/07/2012 0815   TRIG 105 01/21/2015 2030   HDL 51 07/07/2012 0815   CHOLHDL 2.5 07/07/2012 0815   VLDL 19 07/07/2012 0815   LDLCALC 59 07/07/2012 0815      Wt Readings from Last 3 Encounters:  05/06/15 135 lb 9.6 oz (61.508 kg)  05/05/15 134 lb 14.7 oz (61.2 kg)   04/30/15 137 lb (62.143 kg)      Other studies Reviewed: Additional studies/ records that were reviewed today include and review of the records demonstrates: 1. Stress test:  Normal nuclear cardiac stress test on 08/25/14, LVEF 57%.  2. Cardiac Cath: 12/10/2013 Patent left main coronary artery, focal significant disease in the mid to distal left anterior descending artery, patent diagonal branch, LIMA to LAD was widely patent, severe restenosis of the proximal left circumflex artery stent, patent SVG to circumflex, 70% proximal ramus stenosis, widely patent SVG to ramus, occluded stent at the ostium of the right coronary artery, and widely patent SVG to distal RCA.  3. Echocardiogram 11/27/2013 Left ventricle: The cavity size was normal. Wall thickness was   increased in a pattern of moderate LVH. Systolic function was   normal. The estimated ejection fraction was in the range of 55%   to 60%. Wall motion was normal; there were no regional wall   motion abnormalities. Doppler parameters are consistent with   abnormal left ventricular relaxation (grade 1 diastolic   dysfunction). Doppler parameters are consistent with high   ventricular filling pressure. - Aortic valve: Mildly calcified annulus. Trileaflet; mildly   thickened leaflets. There was no stenosis. - Mitral valve: Mildly thickened leaflets . There was mild to   moderate, posteriorly directed regurgitation. - Left atrium: The atrium was mildly dilated. - Tricuspid valve: There was mild regurgitation. - Pulmonary arteries: PA peak pressure: 41 mm Hg (S). Mildly   elevated pulmonary pressures.  4. CABG 06/13/2012 CORONARY ARTERY BYPASS GRAFTING x 4 (Left internal mammary artery to left anterior descending, saphenous vein graft to distal right coronary artery, saphenous vein graft to intermediate, saphenous vein graft to circumflex) ENDOSCOPIC VEIN HARVEST LEFT LEG -    ASSESSMENT AND PLAN:  Essential hypertension  Patient  had another admission with hypertensive emergency. She was discharged yesterday. Unfortunately she has not filled her medications  on the increased that was done in the hospital. She had dialysis today so her blood pressure is fine at the current time. I had along discussion with her about the importance of kidneys meds filled soon as she leaves our office. She says she'll go straight to [facility]. This is an increase in the labetalol to 300 mg twice a day and hydralazine 75 mg 3 times a day. Follow-up with Dr.Koneswaran in 1 month  Chronic diastolic CHF (congestive heart failure) (HCC)  Heart failure compensated.  Coronary atherosclerosis of native coronary artery  Stable without angina    Signed, Ermalinda Barrios, PA-C  05/06/2015 2:35 PM    [facility] [address], [place], [place]  [address] Phone: [phone]; Fax: [phone]

## 2015-05-06 NOTE — Assessment & Plan Note (Signed)
Patient had another admission with hypertensive emergency. She was discharged yesterday. Unfortunately she has not filled her medications on the increased that was done in the hospital. She had dialysis today so her blood pressure is fine at the current time. I had along discussion with her about the importance of kidneys meds filled soon as she leaves our office. She says she'll go straight to [employer] [employer]. This is an increase in the labetalol to 300 mg twice a day and hydralazine 75 mg 3 times a day. Follow-up with Dr.Koneswaran in 1 month

## 2015-05-06 NOTE — Assessment & Plan Note (Signed)
Stable without angina 

## 2015-05-12 ENCOUNTER — Ambulatory Visit (INDEPENDENT_AMBULATORY_CARE_PROVIDER_SITE_OTHER): Payer: Medicare Other | Admitting: Internal Medicine

## 2015-05-12 ENCOUNTER — Encounter (INDEPENDENT_AMBULATORY_CARE_PROVIDER_SITE_OTHER): Payer: Self-pay | Admitting: Internal Medicine

## 2015-05-12 VITALS — BP 150/70 | HR 64 | Temp 97.9°F | Ht 61.0 in | Wt 138.8 lb

## 2015-05-12 DIAGNOSIS — K921 Melena: Secondary | ICD-10-CM

## 2015-05-12 MED ORDER — OMEPRAZOLE 20 MG PO CPDR: 20.0000 mg | DELAYED_RELEASE_CAPSULE | Freq: Every day | ORAL | Status: DC

## 2015-05-12 NOTE — Progress Notes (Addendum)
Subjective:    Patient ID: Shawna [name], female    DOB: 1941-07-01, 74 y.o.   MRN: [id number]  HPI Here today for f/u. Recently underwent an EGD last month. Positive for erosive gastroduodenitis, but no evidence of PUD. She tells me she is doing fairly well.  She has not seen any further black stools. Stools are brown in colon. She has a BM every 2-3 days.  Appetite is good. She says at night she has a lot of gas. She has started Gas X which has helped.  She is eating 2 meals a day. Recent hemoglobin 05/06/2015 11.7.  Hx of ESRD and takes dialysis M-W-F.  Shunt in left upper arm. Dialysis x 5 yrs Hx ovarian cancer in 1992 Hx of cardiac stents x 4 per patient. Has been on [place] in the past CABG in 2013.   05/06/2015 Hemoglobin 11.7.     CBC    Component Value Date/Time   WBC 6.0 05/05/2015 0350   RBC 3.81* 05/05/2015 0350   HGB 10.9* 05/05/2015 0350   HCT 36.6 05/05/2015 0350   PLT 255 05/05/2015 0350   MCV 96.1 05/05/2015 0350   MCH 28.6 05/05/2015 0350   MCHC 29.8* 05/05/2015 0350   RDW 18.4* 05/05/2015 0350   LYMPHSABS 0.7 04/02/2015 1106   MONOABS 0.5 04/02/2015 1106   EOSABS 0.5 04/02/2015 1106   BASOSABS 0.1 04/02/2015 1106        04/30/2015 Procedure:   EGD  Indications:  Patient is 74 year old African-American female with multiple medical problems who presents with abdominal pain and melena. She has history of bleeding from duodenal AV malformations about 2 years ago. She is on low-dose aspirin.                                                                                                                Impression: Small sliding hiatal hernia with mild changes of reflux esophagitis limited to GE junction. Erosive gastroduodenitis but no evidence of peptic ulcer disease. Mild antral telangiectasia without stigmata of bleed.      03/26/2013 EGD and small bowel enteroscopy with control of bleeding (APC): melena Dr. Henrene Pastor: Non bleeding duodenal AVM status  post APC. Incidental esophageal stricture.    Her last colonoscopy was in January 2013 by Dr. Britta Mccreedy.    Anemia, Hemocult +.  Two polyps in the cecum, Sessile polyp in the descending colon, Anastomosis.  Biopsy: All tubular adenoma. 11/10/2014 WBC 8.2, H and H 10.5 and 33.2 Review of Systems Past Medical History  Diagnosis Date  . Chronic bronchitis ([place])   . GERD (gastroesophageal reflux disease)   . PUD (peptic ulcer disease)   . History of lower GI bleeding   . Arthritis   . History of gout   . CAD (coronary artery disease)     a. 12/2011 NSTEMI/Cath/PCI LCX (2.25x14 Resolute DES) & D1 (2.25x22 Resolute DES);  b. 01/2012 Cath/PCI: LM 30, LAD 30p, 40-40m, D1 stent ok, 99 in sm branch of diag, LCX patent stent, OM1 20,  RCA 95 ost (4.0x12 Promus DES), EF 55%;  c. 04/2012 Lexi Cardiolite  EF 48%, small area of scar @ base/mid inflat wall with mild peri-infarct ischemia.; CABG 12/4  . High cholesterol 12/2011  . Pneumonia ~ 2009  . Iron deficiency anemia   . TIA (transient ischemic attack)   . Anxiety   . History of blood transfusion 07/2011; 12/2011; 01/2012 X 2; 04/2012  . Carotid artery disease ([place])     a. [id number] LICA, [id number]   . Mitral regurgitation     a. Moderate by echo, 02/2012  . Myocardial infarction ([place])   . Chronic diastolic CHF (congestive heart failure) ([place])     a. 02/2012 Echo EF 60-65%, nl wall motion, Gr 1 DD, mod MR  . Hypertension   . AVM (arteriovenous malformation) of colon   . Esophageal stricture   . Ovarian cancer ([place]) 1992  . Colon cancer ([place]) 1992  . ESRD on hemodialysis ([place])     ESRD due to HTN, started dialysis 2011 and gets HD at [facility] with Dr Hinda Lenis on MWF schedule.  Access is LUA AVF as of Sept 2014.     Past Surgical History  Procedure Laterality Date  . Abdominal hysterectomy  1992  . Appendectomy  06/1990  . Tubal ligation  1980's  . Av fistula placement  07/2009    left upper arm  . Thrombectomy / arteriovenous graft revision  2011     left upper arm  . Colon resection  1992  . Esophagogastroduodenoscopy  01/20/2012    Procedure: ESOPHAGOGASTRODUODENOSCOPY (EGD);  Surgeon: Ladene Artist, MD,FACG;  Location: [facility] ENDOSCOPY;  Service: Endoscopy;  Laterality: N/A;  . Dilation and curettage of uterus    . Coronary angioplasty with stent placement  12/15/11    "2"  . Coronary angioplasty with stent placement  y/2013    "1; makes total of 3" (05/02/2012)  . Coronary artery bypass graft  06/13/2012    Procedure: CORONARY ARTERY BYPASS GRAFTING (CABG);  Surgeon: Grace Isaac, MD;  Location: [place];  Service: Open Heart Surgery;  Laterality: N/A;  cabg x four;  using left internal mammary artery, and left leg greater saphenous vein harvested endoscopically  . Intraoperative transesophageal echocardiogram  06/13/2012    Procedure: INTRAOPERATIVE TRANSESOPHAGEAL ECHOCARDIOGRAM;  Surgeon: Grace Isaac, MD;  Location: [place];  Service: Open Heart Surgery;  Laterality: N/A;  . Esophagogastroduodenoscopy N/A 03/26/2013    Procedure: ESOPHAGOGASTRODUODENOSCOPY (EGD);  Surgeon: Irene Shipper, MD;  Location: [facility] ENDOSCOPY;  Service: Endoscopy;  Laterality: N/A;  . Ovary surgery      ovarian cancer  . Cardiac surgery    . Left heart catheterization with coronary angiogram N/A 12/15/2011    Procedure: LEFT HEART CATHETERIZATION WITH CORONARY ANGIOGRAM;  Surgeon: Burnell Blanks, MD;  Location: [facility] CATH LAB;  Service: Cardiovascular;  Laterality: N/A;  . Left heart catheterization with coronary angiogram N/A 01/10/2012    Procedure: LEFT HEART CATHETERIZATION WITH CORONARY ANGIOGRAM;  Surgeon: Peter M Martinique, MD;  Location: [facility] CATH LAB;  Service: Cardiovascular;  Laterality: N/A;  . Left heart catheterization with coronary angiogram N/A 06/08/2012    Procedure: LEFT HEART CATHETERIZATION WITH CORONARY ANGIOGRAM;  Surgeon: Burnell Blanks, MD;  Location: [facility] CATH LAB;  Service: Cardiovascular;  Laterality: N/A;  . Shuntogram N/A 10/15/2013     Procedure: Fistulogram;  Surgeon: Serafina Mitchell, MD;  Location: [facility] CATH LAB;  Service: Cardiovascular;  Laterality: N/A;  . Left heart catheterization with  coronary/graft angiogram N/A 12/10/2013    Procedure: LEFT HEART CATHETERIZATION WITH Beatrix Fetters;  Surgeon: [name], MD;  Location: [facility] CATH LAB;  Service: Cardiovascular;  Laterality: N/A;  . Esophagogastroduodenoscopy N/A 04/30/2015    Procedure: ESOPHAGOGASTRODUODENOSCOPY (EGD);  Surgeon: Rogene Houston, MD;  Location: [facility] ENDO [facility];  Service: Endoscopy;  Laterality: N/A;  1pm - moved to 10/20 @ 1:10    Allergies  Allergen Reactions  . Aspirin Other (See Comments)    Mess up her stomach; "makes my bowels have blood in them". Takes 81 mg EC Aspirin   . Contrast Media [Iodinated Diagnostic Agents] Itching  . Iron Itching and Other (See Comments)    "they gave me iron in dialysis; had to give me Benadryl cause I had to have the iron" (05/02/2012)  . Macrodantin [Nitrofurantoin Macrocrystal] Other (See Comments)    "broke me out in big old knots all over my body; had to go to ER"  . Penicillins Other (See Comments)    "makes me real weak when I take it; like I'll pass out"  . Plavix [Clopidogrel Bisulfate] Rash  . Amlodipine Swelling    Leg swelling  . Bactrim [Sulfamethoxazole-Trimethoprim] Rash  . Dexilant [Dexlansoprazole] Other (See Comments)    Upset stomach  . Morphine And Related Itching    Itching in feet  . Prilosec [Omeprazole] Other (See Comments)    "back spasms"  . Sulfa Antibiotics Rash  . Venofer [Ferric Oxide] Itching    Patient reports using Benadryl prior to doses as [facility]  . Levaquin [Levofloxacin In D5w] Rash  . Protonix [Pantoprazole Sodium] Rash    Current Outpatient Prescriptions on File Prior to Visit  Medication Sig Dispense Refill  . acetaminophen (TYLENOL) 325 MG tablet Take 2 tablets (650 mg total) by mouth every 6 (six) hours as needed for mild pain (mild  pain).    Marland Kitchen ALPRAZolam (XANAX) 0.5 MG tablet Take 1 tablet (0.5 mg total) by mouth 3 (three) times daily. 30 tablet 1  . aspirin EC 81 MG tablet Take 81 mg by mouth daily.     . benzonatate (TESSALON PERLES) 100 MG capsule Take 1 capsule (100 mg total) by mouth 3 (three) times daily as needed for cough. 20 capsule 0  . cloNIDine (CATAPRES) 0.3 MG tablet Take 1 tablet (0.3 mg total) by mouth 2 (two) times daily. 60 tablet 6  . dextromethorphan-guaiFENesin (MUCINEX DM) 30-600 MG per 12 hr tablet Take 1 tablet by mouth daily. (Patient taking differently: Take 1 tablet by mouth 3 (three) times daily as needed. ) 14 tablet 1  . fluticasone (FLONASE) 50 MCG/ACT nasal spray Place 2 sprays into the nose daily.     . folic acid-vitamin b complex-vitamin c-selenium-zinc (DIALYVITE) 3 MG TABS Take 1 tablet by mouth daily.    . hydrALAZINE (APRESOLINE) 50 MG tablet Take 1.5 tablets (75 mg total) by mouth 3 (three) times daily. 90 tablet 1  . isosorbide dinitrate (ISORDIL) 20 MG tablet Take 20 mg by mouth 3 (three) times daily.    Marland Kitchen labetalol (NORMODYNE) 200 MG tablet Take 1.5 tablets (300 mg total) by mouth 2 (two) times daily. 60 tablet 1  . lanthanum (FOSRENOL) 1000 MG chewable tablet Chew 1,000 mg by mouth 3 (three) times daily after meals.    . lidocaine-prilocaine (EMLA) cream Apply 1 application topically daily as needed (for port access).     Marland Kitchen loratadine (CLARITIN) 10 MG tablet Take 10 mg by mouth daily.     Marland Kitchen  omeprazole (PRILOSEC) 20 MG capsule TAKE ONE CAPSULE BY MOUTH ONCE DAILY 30 capsule 0  . polyethylene glycol (MIRALAX / GLYCOLAX) packet Take 17 g by mouth daily. (Patient taking differently: Take 17 g by mouth daily as needed for mild constipation or moderate constipation. ) 14 each 0  . PROAIR HFA 108 (90 BASE) MCG/ACT inhaler Inhale 1 puff into the lungs every 6 (six) hours as needed for wheezing or shortness of breath.     . SENSIPAR 30 MG tablet Take 30 mg by mouth daily with supper.     .  simvastatin (ZOCOR) 20 MG tablet Take 1 tablet (20 mg total) by mouth at bedtime. 30 tablet 3  . tetrahydrozoline 0.05 % ophthalmic solution Place 1 drop into both eyes 2 (two) times daily as needed (irritation).    . nitroGLYCERIN (NITROSTAT) 0.4 MG SL tablet Place 1 tablet (0.4 mg total) under the tongue every 5 (five) minutes x 3 doses as needed for chest pain. 25 tablet 3   No current facility-administered medications on file prior to visit.        Objective:   Physical Exam Blood pressure 150/70, pulse 64, temperature 97.9 F (36.6 C), height 5\' 1"  (1.549 m), weight 138 lb 12.8 oz (62.959 kg). Alert and oriented. Skin warm and dry. Oral mucosa is moist.   . Sclera anicteric, conjunctivae is pink. Thyroid not enlarged. No cervical lymphadenopathy. Lungs clear. Heart regular rate and rhythm.  Abdomen is soft. Bowel sounds are positive. No hepatomegaly. No abdominal masses felt. No tenderness.  No edema to lower extremities.         Assessment & Plan:  Melena, No further symptoms since undergoing EGD. Hemoglobin 11.7. CBC in one month.  OV in 3 months. Rx for Omeprazole refilled

## 2015-05-12 NOTE — Patient Instructions (Signed)
OV in 3 months. CBC in one month.

## 2015-05-13 ENCOUNTER — Telehealth (INDEPENDENT_AMBULATORY_CARE_PROVIDER_SITE_OTHER): Payer: Self-pay | Admitting: *Deleted

## 2015-05-13 DIAGNOSIS — K921 Melena: Secondary | ICD-10-CM

## 2015-05-13 NOTE — Telephone Encounter (Signed)
.  Per Lelon Perla patient will have lab work in 1 month.

## 2015-05-27 ENCOUNTER — Encounter (HOSPITAL_COMMUNITY): Payer: Self-pay

## 2015-05-27 ENCOUNTER — Emergency Department (HOSPITAL_COMMUNITY)
Admission: EM | Admit: 2015-05-27 | Discharge: 2015-05-28 | Disposition: A | Discharge status: Home / Self Care | Payer: Medicare Other | Attending: Emergency Medicine | Admitting: Emergency Medicine

## 2015-05-27 DIAGNOSIS — Z8673 Personal history of transient ischemic attack (TIA), and cerebral infarction without residual deficits: Secondary | ICD-10-CM | POA: Diagnosis not present

## 2015-05-27 DIAGNOSIS — Z7982 Long term (current) use of aspirin: Secondary | ICD-10-CM | POA: Insufficient documentation

## 2015-05-27 DIAGNOSIS — Z9861 Coronary angioplasty status: Secondary | ICD-10-CM | POA: Insufficient documentation

## 2015-05-27 DIAGNOSIS — Z8701 Personal history of pneumonia (recurrent): Secondary | ICD-10-CM | POA: Insufficient documentation

## 2015-05-27 DIAGNOSIS — I120 Hypertensive chronic kidney disease with stage 5 chronic kidney disease or end stage renal disease: Secondary | ICD-10-CM | POA: Diagnosis not present

## 2015-05-27 DIAGNOSIS — M109 Gout, unspecified: Secondary | ICD-10-CM | POA: Diagnosis not present

## 2015-05-27 DIAGNOSIS — Z951 Presence of aortocoronary bypass graft: Secondary | ICD-10-CM | POA: Diagnosis not present

## 2015-05-27 DIAGNOSIS — Z85038 Personal history of other malignant neoplasm of large intestine: Secondary | ICD-10-CM | POA: Insufficient documentation

## 2015-05-27 DIAGNOSIS — Z7951 Long term (current) use of inhaled steroids: Secondary | ICD-10-CM | POA: Diagnosis not present

## 2015-05-27 DIAGNOSIS — I5032 Chronic diastolic (congestive) heart failure: Secondary | ICD-10-CM | POA: Diagnosis not present

## 2015-05-27 DIAGNOSIS — I252 Old myocardial infarction: Secondary | ICD-10-CM | POA: Insufficient documentation

## 2015-05-27 DIAGNOSIS — N186 End stage renal disease: Secondary | ICD-10-CM | POA: Diagnosis not present

## 2015-05-27 DIAGNOSIS — Z880 Allergy status to penicillin: Secondary | ICD-10-CM | POA: Diagnosis not present

## 2015-05-27 DIAGNOSIS — K219 Gastro-esophageal reflux disease without esophagitis: Secondary | ICD-10-CM | POA: Diagnosis not present

## 2015-05-27 DIAGNOSIS — Z79899 Other long term (current) drug therapy: Secondary | ICD-10-CM | POA: Insufficient documentation

## 2015-05-27 DIAGNOSIS — F419 Anxiety disorder, unspecified: Secondary | ICD-10-CM

## 2015-05-27 DIAGNOSIS — Z992 Dependence on renal dialysis: Secondary | ICD-10-CM | POA: Diagnosis not present

## 2015-05-27 DIAGNOSIS — R110 Nausea: Secondary | ICD-10-CM | POA: Diagnosis not present

## 2015-05-27 DIAGNOSIS — I2510 Atherosclerotic heart disease of native coronary artery without angina pectoris: Secondary | ICD-10-CM | POA: Insufficient documentation

## 2015-05-27 DIAGNOSIS — I10 Essential (primary) hypertension: Secondary | ICD-10-CM

## 2015-05-27 DIAGNOSIS — Z8711 Personal history of peptic ulcer disease: Secondary | ICD-10-CM | POA: Diagnosis not present

## 2015-05-27 DIAGNOSIS — Z8543 Personal history of malignant neoplasm of ovary: Secondary | ICD-10-CM | POA: Diagnosis not present

## 2015-05-27 DIAGNOSIS — R002 Palpitations: Secondary | ICD-10-CM | POA: Diagnosis present

## 2015-05-27 DIAGNOSIS — M1990 Unspecified osteoarthritis, unspecified site: Secondary | ICD-10-CM | POA: Diagnosis not present

## 2015-05-27 LAB — CBC WITH DIFFERENTIAL/PLATELET
BASOS ABS: 0.1 10*3/uL (ref 0.0–0.1)
Basophils Relative: 1 %
EOS ABS: 0.4 10*3/uL (ref 0.0–0.7)
EOS PCT: 6 %
HCT: 39.2 % (ref 36.0–46.0)
Hemoglobin: 12.2 g/dL (ref 12.0–15.0)
LYMPHS PCT: 8 %
Lymphs Abs: 0.6 10*3/uL — ABNORMAL LOW (ref 0.7–4.0)
MCH: 30 pg (ref 26.0–34.0)
MCHC: 31.1 g/dL (ref 30.0–36.0)
MCV: 96.6 fL (ref 78.0–100.0)
MONO ABS: 0.6 10*3/uL (ref 0.1–1.0)
Monocytes Relative: 9 %
Neutro Abs: 5.4 10*3/uL (ref 1.7–7.7)
Neutrophils Relative %: 76 %
PLATELETS: 219 10*3/uL (ref 150–400)
RBC: 4.06 MIL/uL (ref 3.87–5.11)
RDW: 18.8 % — AB (ref 11.5–15.5)
WBC: 7.1 10*3/uL (ref 4.0–10.5)

## 2015-05-27 LAB — BASIC METABOLIC PANEL
ANION GAP: 14 (ref 5–15)
BUN: 32 mg/dL — AB (ref 6–20)
CALCIUM: 8.5 mg/dL — AB (ref 8.9–10.3)
CO2: 28 mmol/L (ref 22–32)
Chloride: 97 mmol/L — ABNORMAL LOW (ref 101–111)
Creatinine, Ser: 6.28 mg/dL — ABNORMAL HIGH (ref 0.44–1.00)
GFR calc Af Amer: 7 mL/min — ABNORMAL LOW (ref >60)
GFR, EST NON AFRICAN AMERICAN: 6 mL/min — AB (ref >60)
GLUCOSE: 130 mg/dL — AB (ref 65–99)
Potassium: 3.8 mmol/L (ref 3.5–5.1)
SODIUM: 139 mmol/L (ref 135–145)

## 2015-05-27 MED ORDER — ONDANSETRON HCL 4 MG/2ML IJ SOLN
4.0000 mg | Freq: Once | INTRAMUSCULAR | Status: AC
  Administered 2015-05-27: 4 mg via INTRAVENOUS
  Filled 2015-05-27: qty 2

## 2015-05-27 MED ORDER — LORAZEPAM 2 MG/ML IJ SOLN
1.0000 mg | INTRAMUSCULAR | Status: DC | PRN
Start: 2015-05-27 — End: 2015-05-28
  Administered 2015-05-27: 1 mg via INTRAVENOUS
  Filled 2015-05-27: qty 1

## 2015-05-27 MED ORDER — HYDRALAZINE HCL 20 MG/ML IJ SOLN
20.0000 mg | Freq: Once | INTRAMUSCULAR | Status: AC
  Administered 2015-05-27: 20 mg via INTRAVENOUS
  Filled 2015-05-27: qty 1

## 2015-05-27 MED ORDER — HYDRALAZINE HCL 20 MG/ML IJ SOLN
10.0000 mg | Freq: Once | INTRAMUSCULAR | Status: AC
  Administered 2015-05-27: 10 mg via INTRAVENOUS
  Filled 2015-05-27: qty 1

## 2015-05-27 MED ORDER — ALPRAZOLAM 0.5 MG PO TABS
2.0000 mg | ORAL_TABLET | Freq: Once | ORAL | Status: DC
  Filled 2015-05-27: qty 4

## 2015-05-27 NOTE — ED Notes (Signed)
C/o high blood pressure and fast heart rate that started today at 1600 when she got home from dialysis.

## 2015-05-27 NOTE — ED Notes (Signed)
MD at bedside. 

## 2015-05-27 NOTE — ED Provider Notes (Signed)
CSN: [id number]     Arrival date & time 05/27/15  2017 History   First MD Initiated Contact with Patient 05/27/15 2036     Chief Complaint  Patient presents with  . Hypertension      HPI  Patient presents for evaluation of palpitations and hypertension. History of hypertension end-stage renal disease. She was dialyzed today and normal amount. She states she started feeling "funny" during her dialysis and she had them stop. She felt better and was continued she finished her session. She returned home approximately 3 PM. 6 this evening she felt nauseated. She felt like her heart was beating fast and she checked her blood pressure or heart rate. Her heart rate was 110. Her blood pressure was 190/103. She'll continue checking it and it stayed high and she presents here. No vomiting. Stays nauseated. No chest pain or shortness of breath no extremity pain or swelling.  Taking her blood pressure medicines as prescribed, and has taken them tonight.  Past Medical History  Diagnosis Date  . Chronic bronchitis ([place])   . GERD (gastroesophageal reflux disease)   . PUD (peptic ulcer disease)   . History of lower GI bleeding   . Arthritis   . History of gout   . CAD (coronary artery disease)     a. 12/2011 NSTEMI/Cath/PCI LCX (2.25x14 Resolute DES) & D1 (2.25x22 Resolute DES);  b. 01/2012 Cath/PCI: LM 30, LAD 30p, 40-110m, D1 stent ok, 99 in sm branch of diag, LCX patent stent, OM1 20, RCA 95 ost (4.0x12 Promus DES), EF 55%;  c. 04/2012 Lexi Cardiolite  EF 48%, small area of scar @ base/mid inflat wall with mild peri-infarct ischemia.; CABG 12/4  . High cholesterol 12/2011  . Pneumonia ~ 2009  . Iron deficiency anemia   . TIA (transient ischemic attack)   . Anxiety   . History of blood transfusion 07/2011; 12/2011; 01/2012 X 2; 04/2012  . Carotid artery disease ([place])     a. [id number] LICA, [id number]   . Mitral regurgitation     a. Moderate by echo, 02/2012  . Myocardial infarction ([place])   . Chronic diastolic  CHF (congestive heart failure) ([place])     a. 02/2012 Echo EF 60-65%, nl wall motion, Gr 1 DD, mod MR  . Hypertension   . AVM (arteriovenous malformation) of colon   . Esophageal stricture   . Ovarian cancer ([place]) 1992  . Colon cancer ([place]) 1992  . ESRD on hemodialysis ([place])     ESRD due to HTN, started dialysis 2011 and gets HD at [facility] with Dr Hinda Lenis on MWF schedule.  Access is LUA AVF as of Sept 2014.    Past Surgical History  Procedure Laterality Date  . Abdominal hysterectomy  1992  . Appendectomy  06/1990  . Tubal ligation  1980's  . Av fistula placement  07/2009    left upper arm  . Thrombectomy / arteriovenous graft revision  2011    left upper arm  . Colon resection  1992  . Esophagogastroduodenoscopy  01/20/2012    Procedure: ESOPHAGOGASTRODUODENOSCOPY (EGD);  Surgeon: Ladene Artist, MD,FACG;  Location: [facility] ENDOSCOPY;  Service: Endoscopy;  Laterality: N/A;  . Dilation and curettage of uterus    . Coronary angioplasty with stent placement  12/15/11    "2"  . Coronary angioplasty with stent placement  y/2013    "1; makes total of 3" (05/02/2012)  . Coronary artery bypass graft  06/13/2012    Procedure: CORONARY ARTERY BYPASS GRAFTING (  CABG);  Surgeon: Grace Isaac, MD;  Location: [place];  Service: Open Heart Surgery;  Laterality: N/A;  cabg x four;  using left internal mammary artery, and left leg greater saphenous vein harvested endoscopically  . Intraoperative transesophageal echocardiogram  06/13/2012    Procedure: INTRAOPERATIVE TRANSESOPHAGEAL ECHOCARDIOGRAM;  Surgeon: Grace Isaac, MD;  Location: [place];  Service: Open Heart Surgery;  Laterality: N/A;  . Esophagogastroduodenoscopy N/A 03/26/2013    Procedure: ESOPHAGOGASTRODUODENOSCOPY (EGD);  Surgeon: Irene Shipper, MD;  Location: [facility] ENDOSCOPY;  Service: Endoscopy;  Laterality: N/A;  . [place]vary surgery      ovarian cancer  . Cardiac surgery    . Left heart catheterization with coronary angiogram N/A 12/15/2011     Procedure: LEFT HEART CATHETERIZATION WITH CORONARY ANGIOGRAM;  Surgeon: Burnell Blanks, MD;  Location: [facility] CATH LAB;  Service: Cardiovascular;  Laterality: N/A;  . Left heart catheterization with coronary angiogram N/A 01/10/2012    Procedure: LEFT HEART CATHETERIZATION WITH CORONARY ANGIOGRAM;  Surgeon: Peter M Martinique, MD;  Location: [facility] CATH LAB;  Service: Cardiovascular;  Laterality: N/A;  . Left heart catheterization with coronary angiogram N/A 06/08/2012    Procedure: LEFT HEART CATHETERIZATION WITH CORONARY ANGIOGRAM;  Surgeon: Burnell Blanks, MD;  Location: [facility] CATH LAB;  Service: Cardiovascular;  Laterality: N/A;  . Shuntogram N/A 10/15/2013    Procedure: Fistulogram;  Surgeon: Serafina Mitchell, MD;  Location: [facility] CATH LAB;  Service: Cardiovascular;  Laterality: N/A;  . Left heart catheterization with coronary/graft angiogram N/A 12/10/2013    Procedure: LEFT HEART CATHETERIZATION WITH Beatrix Fetters;  Surgeon: [name], MD;  Location: [facility] CATH LAB;  Service: Cardiovascular;  Laterality: N/A;  . Esophagogastroduodenoscopy N/A 04/30/2015    Procedure: ESOPHAGOGASTRODUODENOSCOPY (EGD);  Surgeon: Rogene Houston, MD;  Location: [facility] ENDO SUITE;  Service: Endoscopy;  Laterality: N/A;  1pm - moved to 10/20 @ 1:10   Family History  Problem Relation Age of Onset  . Other      noncontributory for early CAD  . Heart disease Mother     Heart Disease before age 67  . Hyperlipidemia Mother   . Hypertension Mother   . Diabetes Mother   . Heart attack Mother   . Heart disease Father     Heart Disease before age 55  . Hyperlipidemia Father   . Hypertension Father   . Diabetes Father   . Diabetes Sister   . Hypertension Sister   . Diabetes Brother   . Hyperlipidemia Brother   . Heart attack Brother   . Colon cancer Neg Hx   . Esophageal cancer Neg Hx   . Liver disease Neg Hx   . Kidney disease Neg Hx   . Colon polyps Neg Hx   . Hypertension Sister   . Heart  attack Brother    Social History  Substance Use Topics  . Smoking status: Never Smoker   . Smokeless tobacco: Never Used  . Alcohol Use: No   OB History    No data available     Review of Systems  Constitutional: Negative for fever, chills, diaphoresis, appetite change and fatigue.  HENT: Negative for mouth sores, sore throat and trouble swallowing.   Eyes: Negative for visual disturbance.  Respiratory: Negative for cough, chest tightness, shortness of breath and wheezing.   Cardiovascular: Positive for palpitations. Negative for chest pain.  Gastrointestinal: Positive for nausea. Negative for vomiting, abdominal pain, diarrhea and abdominal distention.  Endocrine: Negative for polydipsia, polyphagia and polyuria.  Genitourinary: Negative for dysuria, frequency and hematuria.  Musculoskeletal: Negative for gait problem.  Skin: Negative for color change, pallor and rash.  Neurological: Negative for dizziness, syncope, light-headedness and headaches.  Hematological: Does not bruise/bleed easily.  Psychiatric/Behavioral: Negative for behavioral problems and confusion.      Allergies  Aspirin; Contrast media; Iron; Macrodantin; Penicillins; Plavix; Amlodipine; Bactrim; Dexilant; Morphine and related; Nitrofurantoin; Prilosec; Sulfa antibiotics; Venofer; Levaquin; and Protonix  Home Medications   Prior to Admission medications   Medication Sig Start Date End Date Taking? Authorizing Provider  ALPRAZolam Duanne Moron) 0.5 MG tablet Take 1 tablet (0.5 mg total) by mouth 3 (three) times daily. 01/27/15  Yes Theodis Blaze, MD  aspirin EC 81 MG tablet Take 81 mg by mouth daily.    Yes Historical Provider, MD  cloNIDine (CATAPRES) 0.3 MG tablet Take 1 tablet (0.3 mg total) by mouth 2 (two) times daily. 12/19/14  Yes Herminio Commons, MD  fluticasone (FLONASE) 50 MCG/ACT nasal spray Place 2 sprays into the nose daily.    Yes Historical Provider, MD  folic acid-vitamin b complex-vitamin  c-selenium-zinc (DIALYVITE) 3 MG TABS Take 1 tablet by mouth daily.   Yes Historical Provider, MD  hydrALAZINE (APRESOLINE) 50 MG tablet Take 1.5 tablets (75 mg total) by mouth 3 (three) times daily. 05/05/15  Yes Kathie Dike, MD  isosorbide dinitrate (ISORDIL) 20 MG tablet Take 20 mg by mouth 3 (three) times daily. 01/15/15  Yes Historical Provider, MD  labetalol (NORMODYNE) 200 MG tablet Take 1.5 tablets (300 mg total) by mouth 2 (two) times daily. 05/05/15  Yes Kathie Dike, MD  lanthanum (FOSRENOL) 1000 MG chewable tablet Chew 1,000 mg by mouth 3 (three) times daily after meals.   Yes Historical Provider, MD  lidocaine-prilocaine (EMLA) cream Apply 1 application topically daily as needed (for port access).  11/29/14  Yes Historical Provider, MD  loratadine (CLARITIN) 10 MG tablet Take 10 mg by mouth daily.    Yes Historical Provider, MD  omeprazole (PRILOSEC) 20 MG capsule Take 1 capsule (20 mg total) by mouth daily. 05/12/15  Yes Terri Leda Quail, NP  SENSIPAR 30 MG tablet Take 30 mg by mouth daily with supper.  12/21/12  Yes Historical Provider, MD  simvastatin (ZOCOR) 20 MG tablet Take 1 tablet (20 mg total) by mouth at bedtime. 11/04/14  Yes Herminio Commons, MD  acetaminophen (TYLENOL) 325 MG tablet Take 2 tablets (650 mg total) by mouth every 6 (six) hours as needed for mild pain (mild pain). 10/02/14   Erline Hau, MD  benzonatate (TESSALON PERLES) 100 MG capsule Take 1 capsule (100 mg total) by mouth 3 (three) times daily as needed for cough. 10/02/14   Erline Hau, MD  dextromethorphan-guaiFENesin [facility] DM) 30-600 MG per 12 hr tablet Take 1 tablet by mouth daily. Patient taking differently: Take 1 tablet by mouth 3 (three) times daily as needed.  09/29/14   Fredia Sorrow, MD  nitroGLYCERIN (NITROSTAT) 0.4 MG SL tablet Place 1 tablet (0.4 mg total) under the tongue every 5 (five) minutes x 3 doses as needed for chest pain. 06/23/14   Herminio Commons, MD   ondansetron (ZOFRAN-ODT) 4 MG disintegrating tablet Take 4 mg by mouth every 8 (eight) hours as needed. nausea 05/18/15   Historical Provider, MD  polyethylene glycol (MIRALAX / GLYCOLAX) packet Take 17 g by mouth daily. Patient taking differently: Take 17 g by mouth daily as needed for mild constipation or moderate constipation.  10/02/14  Erline Hau, MD  PROAIR HFA 108 [phone] BASE) MCG/ACT inhaler Inhale 1 puff into the lungs every 6 (six) hours as needed for wheezing or shortness of breath.  01/09/15   Historical Provider, MD  tetrahydrozoline 0.05 % ophthalmic solution Place 1 drop into both eyes 2 (two) times daily as needed (irritation).    Historical Provider, MD   BP 196/79 mmHg  Pulse 79  Temp(Src) 98.6 F (37 C) (Oral)  Resp 18  Ht 5\' 1"  (1.549 m)  Wt 136 lb (61.689 kg)  BMI 25.71 kg/m2  SpO2 96% Physical Exam  Constitutional: She is oriented to person, place, and time. She appears well-developed and well-nourished. No distress.  She is awake alert. She is quite anxious.  HENT:  Head: Normocephalic.  Eyes: Conjunctivae are normal. Pupils are equal, round, and reactive to light. No scleral icterus.  Neck: Normal range of motion. Neck supple. No thyromegaly present.  Cardiovascular: Normal rate and regular rhythm.  Exam reveals no gallop and no friction rub.   No murmur heard. Is regular. Sinus rhythm on the monitor. No ectopy. Clear lungs. No peripheral edema.  Pulmonary/Chest: Effort normal and breath sounds normal. No respiratory distress. She has no wheezes. She has no rales.  Abdominal: Soft. Bowel sounds are normal. She exhibits no distension. There is no tenderness. There is no rebound.  Soft benign abdomen. Normal active bowel sounds.  Musculoskeletal: Normal range of motion.  Neurological: She is alert and oriented to person, place, and time.  Skin: Skin is warm and dry. No rash noted.  Psychiatric: She has a normal mood and affect. Her behavior is normal.     ED Course  Procedures (including critical care time) Labs Review Labs Reviewed  CBC WITH DIFFERENTIAL/PLATELET - Abnormal; Notable for the following:    RDW 18.8 (*)    Lymphs Abs 0.6 (*)    All other components within normal limits  BASIC METABOLIC PANEL - Abnormal; Notable for the following:    Chloride 97 (*)    Glucose, Bld 130 (*)    BUN 32 (*)    Creatinine, Ser 6.28 (*)    Calcium 8.5 (*)    GFR calc non Af Amer 6 (*)    GFR calc Af Amer 7 (*)    All other components within normal limits    Imaging Review No results found. I have personally reviewed and evaluated these images and lab results as part of my medical decision-making.   EKG Interpretation None      MDM   Final diagnoses:  Essential hypertension  Nausea  Anxiety    Patient medicated. Blood pressure still remains in the 190s. Given some IV Ativan and she was complaining of increasing anxiety. Will reassess.    Tanna Furry, MD 05/27/15 [phone]

## 2015-05-28 MED ORDER — CLONIDINE HCL 0.1 MG PO TABS
0.1000 mg | ORAL_TABLET | Freq: Once | ORAL | Status: AC
  Administered 2015-05-28: 0.1 mg via ORAL
  Filled 2015-05-28: qty 1

## 2015-05-28 NOTE — ED Provider Notes (Signed)
Patient was left to change his shift to observe for hypertension. She went from a blood pressure of 202/85 and after treatment she dropped to 132/86. She was to be observed for a period of time and if her blood pressure stabilized she can be discharged. However around her blood pressure started getting high again. At the time of my exam she was 203/81. When asked how she feels she states "I'm in between". However when I specifically asked her what that means she states she feels fine. She states her blood pressure gets too high she gets nauseated. The nurse at our talk to me before he entered her room and I ordered clonidine 0.1 mg. She states clonidine usually works and she normally takes it at home when her blood pressure gets high. However she states it can "bottom me out".  Patient is alert and cooperative and in no distress.   Medications  LORazepam (ATIVAN) injection 1 mg (1 mg Intravenous Given 05/27/15 2247)  hydrALAZINE (APRESOLINE) injection 10 mg (10 mg Intravenous Given 05/27/15 2123)  ondansetron (ZOFRAN) injection 4 mg (4 mg Intravenous Given 05/27/15 2124)  hydrALAZINE (APRESOLINE) injection 20 mg (20 mg Intravenous Given 05/27/15 2209)  ondansetron (ZOFRAN) injection 4 mg (4 mg Intravenous Given 05/27/15 2209)  cloNIDine (CATAPRES) tablet 0.1 mg (0.1 mg Oral Given 05/28/15 0028)     01:15 BP is 155/66. Pt is feeling fine. Will discharge home.   Diagnoses that have been ruled out:  None  Diagnoses that are still under consideration:  None  Final diagnoses:  Essential hypertension  Nausea  Anxiety    Plan discharge  Rolland Porter, MD, Barbette Or, MD 05/28/15 [phone]

## 2015-05-28 NOTE — ED Notes (Signed)
Patient verbalizes understanding of discharge instructions, home care and follow up care if needed. Patient out of department at this time with family. 

## 2015-06-03 ENCOUNTER — Encounter (INDEPENDENT_AMBULATORY_CARE_PROVIDER_SITE_OTHER): Payer: Self-pay | Admitting: *Deleted

## 2015-06-03 ENCOUNTER — Other Ambulatory Visit (INDEPENDENT_AMBULATORY_CARE_PROVIDER_SITE_OTHER): Payer: Self-pay | Admitting: *Deleted

## 2015-06-03 DIAGNOSIS — K921 Melena: Secondary | ICD-10-CM

## 2015-06-07 ENCOUNTER — Emergency Department (HOSPITAL_COMMUNITY): Payer: Medicare Other

## 2015-06-07 ENCOUNTER — Encounter (HOSPITAL_COMMUNITY): Payer: Self-pay | Admitting: Nurse Practitioner

## 2015-06-07 ENCOUNTER — Emergency Department (HOSPITAL_COMMUNITY)
Admission: EM | Admit: 2015-06-07 | Discharge: 2015-06-07 | Disposition: A | Discharge status: Home / Self Care | Payer: Medicare Other | Attending: Emergency Medicine | Admitting: Emergency Medicine

## 2015-06-07 DIAGNOSIS — Z7982 Long term (current) use of aspirin: Secondary | ICD-10-CM | POA: Insufficient documentation

## 2015-06-07 DIAGNOSIS — N186 End stage renal disease: Secondary | ICD-10-CM | POA: Diagnosis not present

## 2015-06-07 DIAGNOSIS — Z9861 Coronary angioplasty status: Secondary | ICD-10-CM | POA: Insufficient documentation

## 2015-06-07 DIAGNOSIS — R5383 Other fatigue: Secondary | ICD-10-CM | POA: Diagnosis present

## 2015-06-07 DIAGNOSIS — Z951 Presence of aortocoronary bypass graft: Secondary | ICD-10-CM | POA: Insufficient documentation

## 2015-06-07 DIAGNOSIS — Q2733 Arteriovenous malformation of digestive system vessel: Secondary | ICD-10-CM | POA: Diagnosis not present

## 2015-06-07 DIAGNOSIS — E7800 Pure hypercholesterolemia, unspecified: Secondary | ICD-10-CM | POA: Diagnosis not present

## 2015-06-07 DIAGNOSIS — I5032 Chronic diastolic (congestive) heart failure: Secondary | ICD-10-CM | POA: Diagnosis not present

## 2015-06-07 DIAGNOSIS — Z8673 Personal history of transient ischemic attack (TIA), and cerebral infarction without residual deficits: Secondary | ICD-10-CM | POA: Diagnosis not present

## 2015-06-07 DIAGNOSIS — I252 Old myocardial infarction: Secondary | ICD-10-CM | POA: Insufficient documentation

## 2015-06-07 DIAGNOSIS — Z880 Allergy status to penicillin: Secondary | ICD-10-CM | POA: Insufficient documentation

## 2015-06-07 DIAGNOSIS — Z8543 Personal history of malignant neoplasm of ovary: Secondary | ICD-10-CM | POA: Insufficient documentation

## 2015-06-07 DIAGNOSIS — M1990 Unspecified osteoarthritis, unspecified site: Secondary | ICD-10-CM | POA: Insufficient documentation

## 2015-06-07 DIAGNOSIS — I120 Hypertensive chronic kidney disease with stage 5 chronic kidney disease or end stage renal disease: Secondary | ICD-10-CM | POA: Insufficient documentation

## 2015-06-07 DIAGNOSIS — Z79899 Other long term (current) drug therapy: Secondary | ICD-10-CM | POA: Insufficient documentation

## 2015-06-07 DIAGNOSIS — I2510 Atherosclerotic heart disease of native coronary artery without angina pectoris: Secondary | ICD-10-CM | POA: Insufficient documentation

## 2015-06-07 DIAGNOSIS — F419 Anxiety disorder, unspecified: Secondary | ICD-10-CM | POA: Insufficient documentation

## 2015-06-07 DIAGNOSIS — Z8711 Personal history of peptic ulcer disease: Secondary | ICD-10-CM | POA: Insufficient documentation

## 2015-06-07 DIAGNOSIS — Z85038 Personal history of other malignant neoplasm of large intestine: Secondary | ICD-10-CM | POA: Insufficient documentation

## 2015-06-07 DIAGNOSIS — R0602 Shortness of breath: Secondary | ICD-10-CM | POA: Diagnosis not present

## 2015-06-07 DIAGNOSIS — Z992 Dependence on renal dialysis: Secondary | ICD-10-CM | POA: Diagnosis not present

## 2015-06-07 DIAGNOSIS — Z7951 Long term (current) use of inhaled steroids: Secondary | ICD-10-CM | POA: Diagnosis not present

## 2015-06-07 DIAGNOSIS — I10 Essential (primary) hypertension: Secondary | ICD-10-CM

## 2015-06-07 DIAGNOSIS — R51 Headache: Secondary | ICD-10-CM | POA: Diagnosis not present

## 2015-06-07 DIAGNOSIS — K219 Gastro-esophageal reflux disease without esophagitis: Secondary | ICD-10-CM | POA: Insufficient documentation

## 2015-06-07 DIAGNOSIS — Z8701 Personal history of pneumonia (recurrent): Secondary | ICD-10-CM | POA: Diagnosis not present

## 2015-06-07 LAB — CBC
HEMATOCRIT: 40 % (ref 36.0–46.0)
Hemoglobin: 12.4 g/dL (ref 12.0–15.0)
MCH: 30.5 pg (ref 26.0–34.0)
MCHC: 31 g/dL (ref 30.0–36.0)
MCV: 98.5 fL (ref 78.0–100.0)
Platelets: 247 10*3/uL (ref 150–400)
RBC: 4.06 MIL/uL (ref 3.87–5.11)
RDW: 20.2 % — AB (ref 11.5–15.5)
WBC: 7.9 10*3/uL (ref 4.0–10.5)

## 2015-06-07 LAB — BASIC METABOLIC PANEL
Anion gap: 16 — ABNORMAL HIGH (ref 5–15)
BUN: 62 mg/dL — AB (ref 6–20)
CHLORIDE: 100 mmol/L — AB (ref 101–111)
CO2: 23 mmol/L (ref 22–32)
Calcium: 9.5 mg/dL (ref 8.9–10.3)
Creatinine, Ser: 10.64 mg/dL — ABNORMAL HIGH (ref 0.44–1.00)
GFR calc Af Amer: 4 mL/min — ABNORMAL LOW (ref >60)
GFR calc non Af Amer: 3 mL/min — ABNORMAL LOW (ref >60)
GLUCOSE: 102 mg/dL — AB (ref 65–99)
POTASSIUM: 5.2 mmol/L — AB (ref 3.5–5.1)
Sodium: 139 mmol/L (ref 135–145)

## 2015-06-07 IMAGING — CR DG CHEST 2V
2 series · 2 of 2 positions shown · non-contrast
Comparison: Chest x-ray [DATE].

CLINICAL DATA: 74-year-old female with shortness of breath since
this morning.

EXAM:
CHEST  2 VIEW

[chest pa]
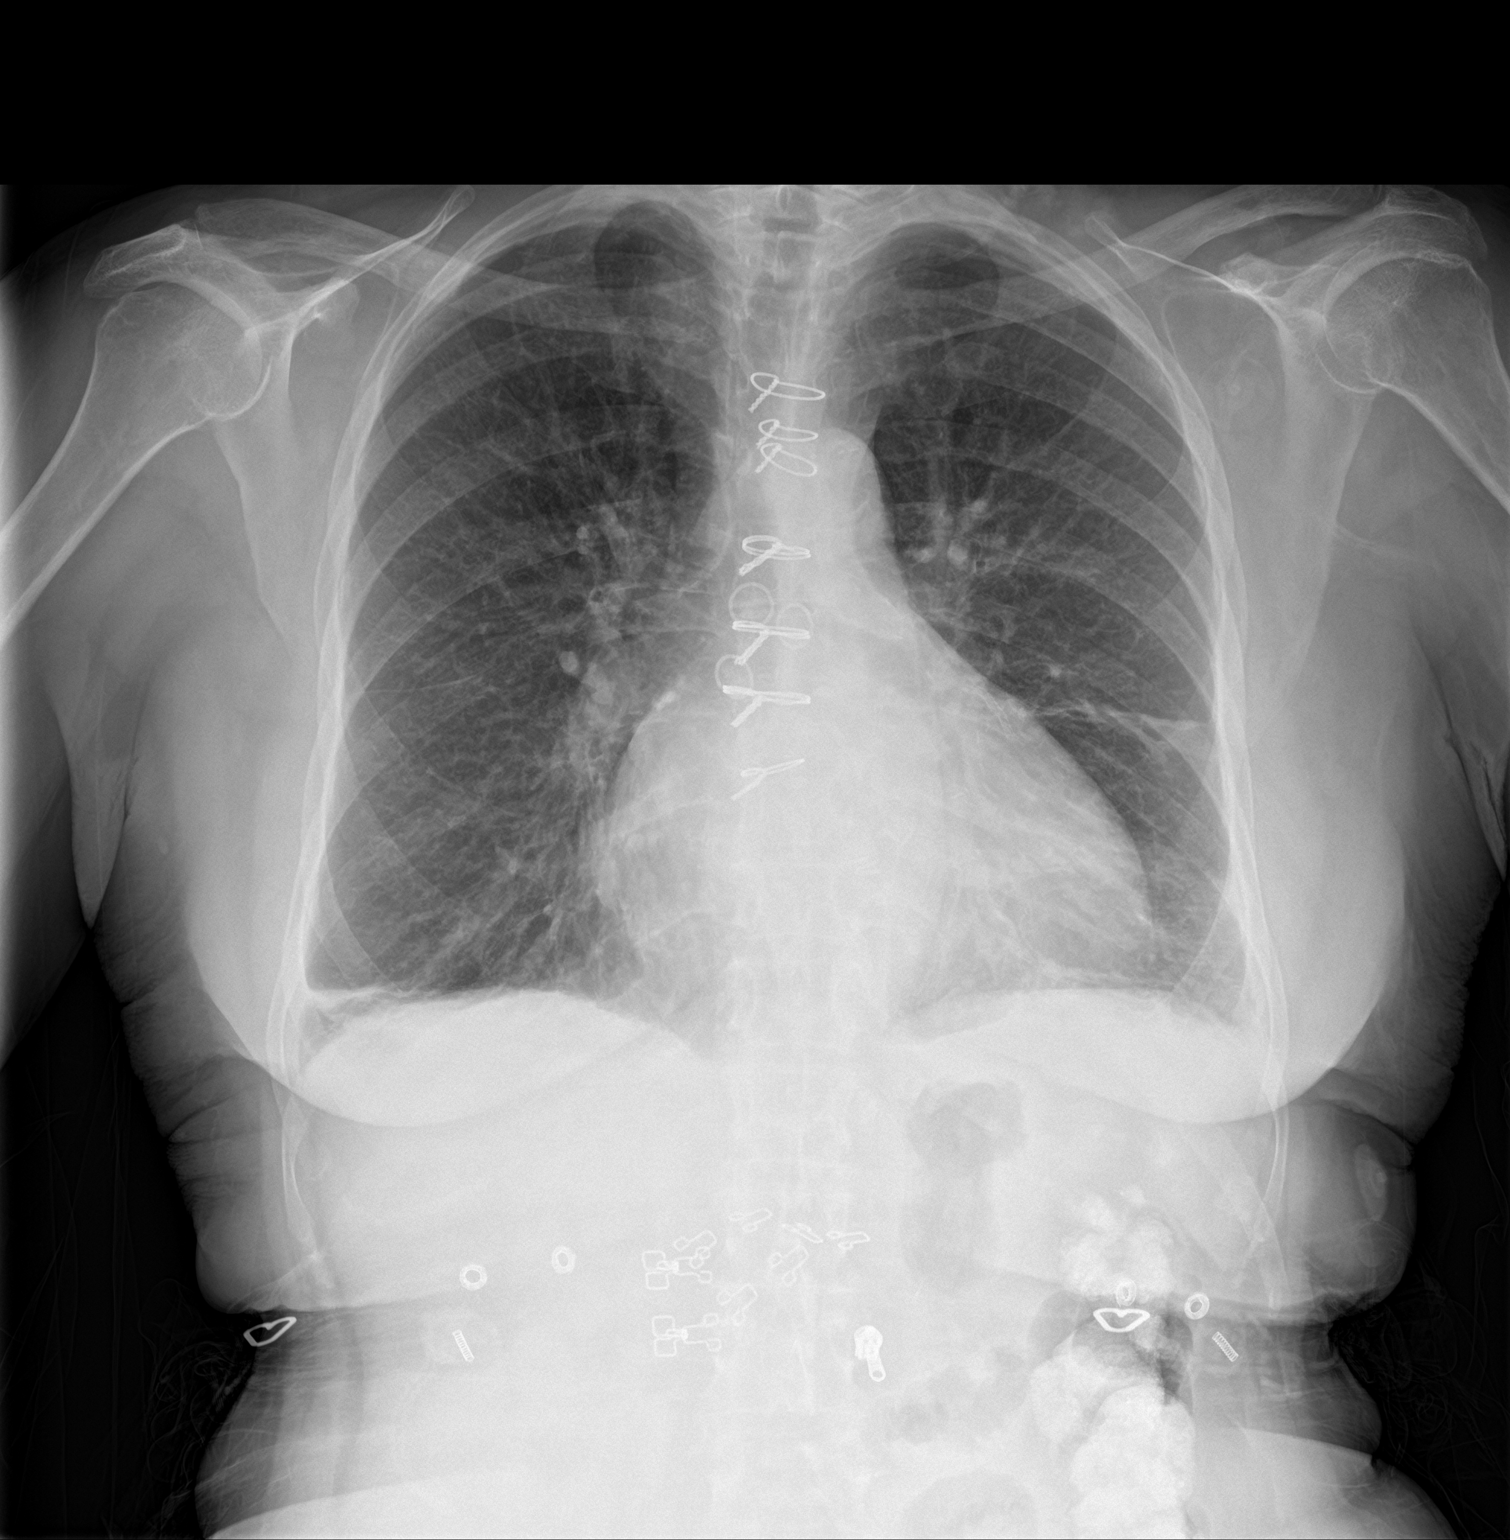

[chest lat]
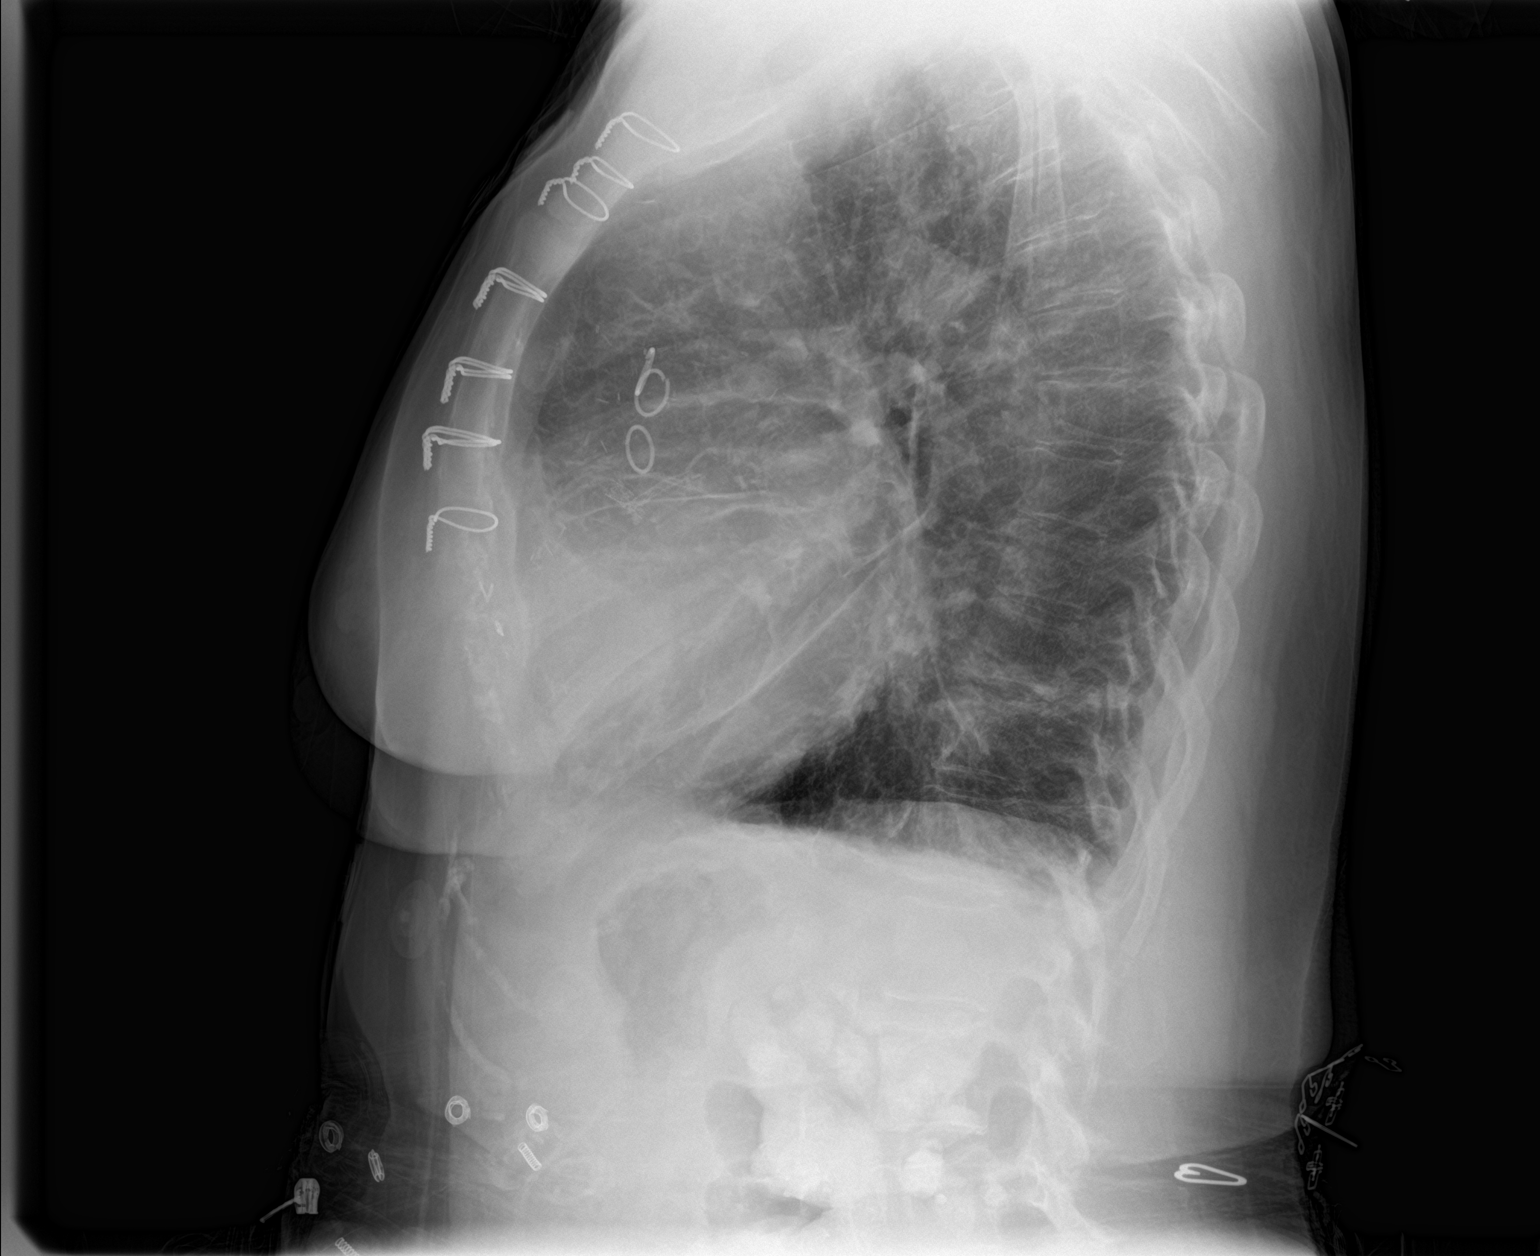

[2 of 2 positions shown; findings below may reference images not displayed]

FINDINGS: Linear opacities are noted in the lung bases bilaterally on the left
mid lung, similar to the prior examination, most likely areas of
chronic scarring. No acute consolidative airspace disease. Mild
diffuse interstitial prominence and peribronchial cuffing. Trace
bilateral pleural effusions. Mild cephalization of the pulmonary
vasculature. Heart size is mildly enlarged (unchanged). Upper
mediastinal contours are within normal limits. Atherosclerosis in
the thoracic aorta. Status post median sternotomy for CABG.
IMPRESSION: 1. The appearance of the chest suggests very mild congestive heart
failure, as discussed above.
2. Atherosclerosis.

## 2015-06-07 MED ORDER — LABETALOL HCL 300 MG PO TABS
300.0000 mg | ORAL_TABLET | Freq: Two times a day (BID) | ORAL | Status: DC
  Administered 2015-06-07: 300 mg via ORAL
  Filled 2015-06-07: qty 1

## 2015-06-07 MED ORDER — HYDRALAZINE HCL 50 MG PO TABS
75.0000 mg | ORAL_TABLET | Freq: Three times a day (TID) | ORAL | Status: DC
  Administered 2015-06-07: 75 mg via ORAL
  Filled 2015-06-07 (×3): qty 1

## 2015-06-07 MED ORDER — CINACALCET HCL 30 MG PO TABS
30.0000 mg | ORAL_TABLET | Freq: Every day | ORAL | Status: DC
  Administered 2015-06-07: 30 mg via ORAL
  Filled 2015-06-07 (×2): qty 1

## 2015-06-07 MED ORDER — ISOSORBIDE DINITRATE 20 MG PO TABS
20.0000 mg | ORAL_TABLET | Freq: Three times a day (TID) | ORAL | Status: DC
  Administered 2015-06-07: 20 mg via ORAL
  Filled 2015-06-07 (×3): qty 1

## 2015-06-07 MED ORDER — ALBUTEROL SULFATE HFA 108 (90 BASE) MCG/ACT IN AERS: 1.0000 | INHALATION_SPRAY | Freq: Four times a day (QID) | RESPIRATORY_TRACT | Status: DC | PRN

## 2015-06-07 NOTE — Discharge Instructions (Signed)
PLEASE GO TO DIALYSIS SESSION TOMORROW Please go to your heart doctor- you need better control of your blood pressure.  Please go to ER if your symptoms worsen   Managing Your High Blood Pressure Blood pressure is a measurement of how forceful your blood is pressing against the walls of the arteries. Arteries are muscular tubes within the circulatory system. Blood pressure does not stay the same. Blood pressure rises when you are active, excited, or nervous; and it lowers during sleep and relaxation. If the numbers measuring your blood pressure stay above normal most of the time, you are at risk for health problems. High blood pressure (hypertension) is a long-term (chronic) condition in which blood pressure is elevated. A blood pressure reading is recorded as two numbers, such as 120 over 80 (or 120/80). The first, higher number is called the systolic pressure. It is a measure of the pressure in your arteries as the heart beats. The second, lower number is called the diastolic pressure. It is a measure of the pressure in your arteries as the heart relaxes between beats.  Keeping your blood pressure in a normal range is important to your overall health and prevention of health problems, such as heart disease and stroke. When your blood pressure is uncontrolled, your heart has to work harder than normal. High blood pressure is a very common condition in adults because blood pressure tends to rise with age. Men and women are equally likely to have hypertension but at different times in life. Before age 20, men are more likely to have hypertension. After 75 years of age, women are more likely to have it. Hypertension is especially common in African Americans. This condition often has no signs or symptoms. The cause of the condition is usually not known. Your caregiver can help you come up with a plan to keep your blood pressure in a normal, healthy range. BLOOD PRESSURE STAGES Blood pressure is classified  into four stages: normal, prehypertension, stage 1, and stage 2. Your blood pressure reading will be used to determine what type of treatment, if any, is necessary. Appropriate treatment options are tied to these four stages:  Normal  Systolic pressure (mm Hg): below 120.  Diastolic pressure (mm Hg): below 80. Prehypertension  Systolic pressure (mm Hg): 120 to 139.  Diastolic pressure (mm Hg): 80 to 89. Stage1  Systolic pressure (mm Hg): 140 to 159.  Diastolic pressure (mm Hg): 90 to 99. Stage2  Systolic pressure (mm Hg): 160 or above.  Diastolic pressure (mm Hg): 100 or above. RISKS RELATED TO HIGH BLOOD PRESSURE Managing your blood pressure is an important responsibility. Uncontrolled high blood pressure can lead to:  A heart attack.  A stroke.  A weakened blood vessel (aneurysm).  Heart failure.  Kidney damage.  Eye damage.  Metabolic syndrome.  Memory and concentration problems. HOW TO MANAGE YOUR BLOOD PRESSURE Blood pressure can be managed effectively with lifestyle changes and medicines (if needed). Your caregiver will help you come up with a plan to bring your blood pressure within a normal range. Your plan should include the following: Education  Read all information provided by your caregivers about how to control blood pressure.  Educate yourself on the latest guidelines and treatment recommendations. New research is always being done to further define the risks and treatments for high blood pressure. Lifestylechanges  Control your weight.  Avoid smoking.  Stay physically active.  Reduce the amount of salt in your diet.  Reduce stress.  Control any  chronic conditions, such as high cholesterol or diabetes.  Reduce your alcohol intake. Medicines  Several medicines (antihypertensive medicines) are available, if needed, to bring blood pressure within a normal range. Communication  Review all the medicines you take with your caregiver because  there may be side effects or interactions.  Talk with your caregiver about your diet, exercise habits, and other lifestyle factors that may be contributing to high blood pressure.  See your caregiver regularly. Your caregiver can help you create and adjust your plan for managing high blood pressure. RECOMMENDATIONS FOR TREATMENT AND FOLLOW-UP  The following recommendations are based on current guidelines for managing high blood pressure in nonpregnant adults. Use these recommendations to identify the proper follow-up period or treatment option based on your blood pressure reading. You can discuss these options with your caregiver.  Systolic pressure of 123456 to [id number] or diastolic pressure of 80 to 89: Follow up with your caregiver as directed.  Systolic pressure of [id number] to 0000000 or diastolic pressure of 90 to 100: Follow up with your caregiver within 2 months.  Systolic pressure above 0000000 or diastolic pressure above [id number]: Follow up with your caregiver within 1 month.  Systolic pressure above [id number] or diastolic pressure above A999333: Consider antihypertensive therapy; follow up with your caregiver within 1 week.  Systolic pressure above A999333 or diastolic pressure above 123456: Begin antihypertensive therapy; follow up with your caregiver within 1 week.   This information is not intended to replace advice given to you by your health care provider. Make sure you discuss any questions you have with your health care provider.   Document Released: 03/21/2012 Document Reviewed: 03/21/2012 Elsevier Interactive Patient Education [employer].

## 2015-06-07 NOTE — ED Notes (Signed)
She c/o waking this am feeling fatigued, wheezing, some SOB. Throughout the day shes been extremely fatigued, more than normal and feels she can not stay awake. Shes had a headache also. She is a HD pt, MWF, had all treatments this week. She denies nausea, fevers. She is alert, breathing easy.

## 2015-06-07 NOTE — ED Notes (Signed)
MD at bedside. 

## 2015-06-07 NOTE — ED Notes (Signed)
Ambulated Pt. With pulse ox. PT started at 97%. While walking Pt. Stayed between 97%-100%.

## 2015-06-07 NOTE — ED Provider Notes (Signed)
CSN: [id number]     Arrival date & time 06/07/15  1410 History   First MD Initiated Contact with Patient 06/07/15 1658     Chief Complaint  Patient presents with  . Fatigue     (Consider location/radiation/quality/duration/timing/severity/associated sxs/prior Treatment) HPI 74 yo F with history of hypertensive urgency admitted recently, ESRD on MWF, CAD s/p CABG, chronic diastolic heart failure, HLD, who is here for feeling weak.  Pt says that last night she took all her meds and she was feeling real sleepy . Then thismorning when she woke up her blood pressure was 215/105 then she took her clonidine. Since this morning, she has been feeling really "weak" and fatigued.   She denies fevers/chills/n/v/d/c. But reports some shortness of breath, and some headaches.   She last went to dialysis on Friday and she has not missed any sessions recently.  She also reports compliance with her antihypertensives.       Past Medical History  Diagnosis Date  . Chronic bronchitis ([place])   . GERD (gastroesophageal reflux disease)   . PUD (peptic ulcer disease)   . History of lower GI bleeding   . Arthritis   . History of gout   . CAD (coronary artery disease)     a. 12/2011 NSTEMI/Cath/PCI LCX (2.25x14 Resolute DES) & D1 (2.25x22 Resolute DES);  b. 01/2012 Cath/PCI: LM 30, LAD 30p, 40-22m, D1 stent ok, 99 in sm branch of diag, LCX patent stent, OM1 20, RCA 95 ost (4.0x12 Promus DES), EF 55%;  c. 04/2012 Lexi Cardiolite  EF 48%, small area of scar @ base/mid inflat wall with mild peri-infarct ischemia.; CABG 12/4  . High cholesterol 12/2011  . Pneumonia ~ 2009  . Iron deficiency anemia   . TIA (transient ischemic attack)   . Anxiety   . History of blood transfusion 07/2011; 12/2011; 01/2012 X 2; 04/2012  . Carotid artery disease ([place])     a. [id number] LICA, [id number]   . Mitral regurgitation     a. Moderate by echo, 02/2012  . Myocardial infarction ([place])   . Chronic diastolic CHF (congestive heart  failure) ([place])     a. 02/2012 Echo EF 60-65%, nl wall motion, Gr 1 DD, mod MR  . Hypertension   . AVM (arteriovenous malformation) of colon   . Esophageal stricture   . Ovarian cancer ([place]) 1992  . Colon cancer ([place]) 1992  . ESRD on hemodialysis ([place])     ESRD due to HTN, started dialysis 2011 and gets HD at [facility] with Dr Hinda Lenis on MWF schedule.  Access is LUA AVF as of Sept 2014.    Past Surgical History  Procedure Laterality Date  . Abdominal hysterectomy  1992  . Appendectomy  06/1990  . Tubal ligation  1980's  . Av fistula placement  07/2009    left upper arm  . Thrombectomy / arteriovenous graft revision  2011    left upper arm  . Colon resection  1992  . Esophagogastroduodenoscopy  01/20/2012    Procedure: ESOPHAGOGASTRODUODENOSCOPY (EGD);  Surgeon: Ladene Artist, MD,FACG;  Location: [facility] ENDOSCOPY;  Service: Endoscopy;  Laterality: N/A;  . Dilation and curettage of uterus    . Coronary angioplasty with stent placement  12/15/11    "2"  . Coronary angioplasty with stent placement  y/2013    "1; makes total of 3" (05/02/2012)  . Coronary artery bypass graft  06/13/2012    Procedure: CORONARY ARTERY BYPASS GRAFTING (CABG);  Surgeon: Grace Isaac, MD;  Location: [facility] OR;  Service: Open Heart Surgery;  Laterality: N/A;  cabg x four;  using left internal mammary artery, and left leg greater saphenous vein harvested endoscopically  . Intraoperative transesophageal echocardiogram  06/13/2012    Procedure: INTRAOPERATIVE TRANSESOPHAGEAL ECHOCARDIOGRAM;  Surgeon: Grace Isaac, MD;  Location: [facility];  Service: Open Heart Surgery;  Laterality: N/A;  . Esophagogastroduodenoscopy N/A 03/26/2013    Procedure: ESOPHAGOGASTRODUODENOSCOPY (EGD);  Surgeon: Irene Shipper, MD;  Location: [facility] ENDOSCOPY;  Service: Endoscopy;  Laterality: N/A;  . Ovary surgery      ovarian cancer  . Cardiac surgery    . Left heart catheterization with coronary angiogram N/A 12/15/2011    Procedure: LEFT HEART  CATHETERIZATION WITH CORONARY ANGIOGRAM;  Surgeon: Burnell Blanks, MD;  Location: [facility] CATH LAB;  Service: Cardiovascular;  Laterality: N/A;  . Left heart catheterization with coronary angiogram N/A 01/10/2012    Procedure: LEFT HEART CATHETERIZATION WITH CORONARY ANGIOGRAM;  Surgeon: Peter M Martinique, MD;  Location: [facility] CATH LAB;  Service: Cardiovascular;  Laterality: N/A;  . Left heart catheterization with coronary angiogram N/A 06/08/2012    Procedure: LEFT HEART CATHETERIZATION WITH CORONARY ANGIOGRAM;  Surgeon: Burnell Blanks, MD;  Location: [facility] CATH LAB;  Service: Cardiovascular;  Laterality: N/A;  . Shuntogram N/A 10/15/2013    Procedure: Fistulogram;  Surgeon: Serafina Mitchell, MD;  Location: [facility] CATH LAB;  Service: Cardiovascular;  Laterality: N/A;  . Left heart catheterization with coronary/graft angiogram N/A 12/10/2013    Procedure: LEFT HEART CATHETERIZATION WITH Beatrix Fetters;  Surgeon: [name], MD;  Location: [facility] CATH LAB;  Service: Cardiovascular;  Laterality: N/A;  . Esophagogastroduodenoscopy N/A 04/30/2015    Procedure: ESOPHAGOGASTRODUODENOSCOPY (EGD);  Surgeon: Rogene Houston, MD;  Location: [facility] ENDO SUITE;  Service: Endoscopy;  Laterality: N/A;  1pm - moved to 10/20 @ 1:10   Family History  Problem Relation Age of Onset  . Other      noncontributory for early CAD  . Heart disease Mother     Heart Disease before age 48  . Hyperlipidemia Mother   . Hypertension Mother   . Diabetes Mother   . Heart attack Mother   . Heart disease Father     Heart Disease before age 26  . Hyperlipidemia Father   . Hypertension Father   . Diabetes Father   . Diabetes Sister   . Hypertension Sister   . Diabetes Brother   . Hyperlipidemia Brother   . Heart attack Brother   . Colon cancer Neg Hx   . Esophageal cancer Neg Hx   . Liver disease Neg Hx   . Kidney disease Neg Hx   . Colon polyps Neg Hx   . Hypertension Sister   . Heart attack Brother    Social  History  Substance Use Topics  . Smoking status: Never Smoker   . Smokeless tobacco: Never Used  . Alcohol Use: No   OB History    No data available     Review of Systems  Constitutional: Positive for fatigue. Negative for fever, activity change, appetite change and unexpected weight change.  Respiratory: Positive for shortness of breath. Negative for cough and wheezing.   Cardiovascular: Negative for chest pain, palpitations and leg swelling.  Gastrointestinal: Negative for nausea, vomiting, abdominal pain, diarrhea, constipation and blood in stool.  Endocrine: Negative for polydipsia and polyuria.  Musculoskeletal: Negative for arthralgias.  Neurological: Positive for headaches. Negative for dizziness, syncope, weakness, light-headedness and numbness.    Marland Kitchen  Allergies  Aspirin; Contrast media; Iron; Macrodantin; Penicillins; Plavix; Amlodipine; Bactrim; Dexilant; Morphine and related; Nitrofurantoin; Prilosec; Sulfa antibiotics; Venofer; Levaquin; and Protonix  Home Medications   Prior to Admission medications   Medication Sig Start Date End Date Taking? Authorizing Provider  ALPRAZolam Duanne Moron) 0.5 MG tablet Take 1 tablet (0.5 mg total) by mouth 3 (three) times daily. 01/27/15  Yes Theodis Blaze, MD  aspirin EC 81 MG tablet Take 81 mg by mouth daily.    Yes Historical Provider, MD  cloNIDine (CATAPRES) 0.3 MG tablet Take 1 tablet (0.3 mg total) by mouth 2 (two) times daily. 12/19/14  Yes Herminio Commons, MD  dextromethorphan-guaiFENesin [facility] DM) 30-600 MG per 12 hr tablet Take 1 tablet by mouth daily. Patient taking differently: Take 1 tablet by mouth 3 (three) times daily as needed.  09/29/14  Yes Fredia Sorrow, MD  fluticasone (FLONASE) 50 MCG/ACT nasal spray Place 2 sprays into the nose daily.    Yes Historical Provider, MD  folic acid-vitamin b complex-vitamin c-selenium-zinc (DIALYVITE) 3 MG TABS Take 1 tablet by mouth daily.   Yes Historical Provider, MD  hydrALAZINE  (APRESOLINE) 50 MG tablet Take 1.5 tablets (75 mg total) by mouth 3 (three) times daily. 05/05/15  Yes Kathie Dike, MD  isosorbide dinitrate (ISORDIL) 20 MG tablet Take 20 mg by mouth 3 (three) times daily. 01/15/15  Yes Historical Provider, MD  labetalol (NORMODYNE) 200 MG tablet Take 1.5 tablets (300 mg total) by mouth 2 (two) times daily. 05/05/15  Yes Kathie Dike, MD  lanthanum (FOSRENOL) 1000 MG chewable tablet Chew 1,000 mg by mouth 3 (three) times daily after meals.   Yes Historical Provider, MD  loratadine (CLARITIN) 10 MG tablet Take 10 mg by mouth daily.    Yes Historical Provider, MD  nitroGLYCERIN (NITROSTAT) 0.4 MG SL tablet Place 1 tablet (0.4 mg total) under the tongue every 5 (five) minutes x 3 doses as needed for chest pain. 06/23/14  Yes Herminio Commons, MD  ondansetron (ZOFRAN-ODT) 4 MG disintegrating tablet Take 4 mg by mouth every 8 (eight) hours as needed. nausea 05/18/15  Yes Historical Provider, MD  PROAIR HFA 108 (90 BASE) MCG/ACT inhaler Inhale 1 puff into the lungs every 6 (six) hours as needed for wheezing or shortness of breath.  01/09/15  Yes Historical Provider, MD  SENSIPAR 30 MG tablet Take 30 mg by mouth daily with supper.  12/21/12  Yes Historical Provider, MD  simvastatin (ZOCOR) 20 MG tablet Take 1 tablet (20 mg total) by mouth at bedtime. 11/04/14  Yes Herminio Commons, MD  tetrahydrozoline 0.05 % ophthalmic solution Place 1 drop into both eyes 2 (two) times daily as needed (irritation).   Yes Historical Provider, MD  acetaminophen (TYLENOL) 325 MG tablet Take 2 tablets (650 mg total) by mouth every 6 (six) hours as needed for mild pain (mild pain). 10/02/14   Erline Hau, MD  benzonatate (TESSALON PERLES) 100 MG capsule Take 1 capsule (100 mg total) by mouth 3 (three) times daily as needed for cough. 10/02/14   Erline Hau, MD  omeprazole (PRILOSEC) 20 MG capsule Take 1 capsule (20 mg total) by mouth daily. 05/12/15   Butch Penny,  NP  polyethylene glycol (MIRALAX / GLYCOLAX) packet Take 17 g by mouth daily. Patient taking differently: Take 17 g by mouth daily as needed for mild constipation or moderate constipation.  10/02/14   Erline Hau, MD   BP 214/84 mmHg  Pulse  69  Temp(Src) 97.8 F (36.6 C) (Oral)  Resp 16  Ht 5\' 1"  (1.549 m)  Wt 63.095 kg  BMI 26.30 kg/m2  SpO2 96% Physical Exam  Constitutional: She is oriented to person, place, and time. She appears well-developed and well-nourished.  HENT:  Head: Normocephalic and atraumatic.  Eyes: Conjunctivae and EOM are normal. Pupils are equal, round, and reactive to light.  Neck: Normal range of motion. Neck supple.  Cardiovascular: Normal rate and regular rhythm.   No murmur heard. Pulmonary/Chest: Effort normal and breath sounds normal.  Abdominal: Soft. Bowel sounds are normal. She exhibits no distension. There is no tenderness.  Neurological: She is alert and oriented to person, place, and time.   Neuro exam is nonfocal    ED Course  Procedures (including critical care time) Labs Review Labs Reviewed  BASIC METABOLIC PANEL  CBC  URINALYSIS, ROUTINE W REFLEX MICROSCOPIC (NOT AT [facility])    Imaging Review Dg Chest 2 View  06/07/2015  CLINICAL DATA:  74 year old female with shortness of breath since this morning. EXAM: CHEST  2 VIEW COMPARISON:  Chest x-ray 05/03/2015. FINDINGS: Linear opacities are noted in the lung bases bilaterally on the left mid lung, similar to the prior examination, most likely areas of chronic scarring. No acute consolidative airspace disease. Mild diffuse interstitial prominence and peribronchial cuffing. Trace bilateral pleural effusions. Mild cephalization of the pulmonary vasculature. Heart size is mildly enlarged (unchanged). Upper mediastinal contours are within normal limits. Atherosclerosis in the thoracic aorta. Status post median sternotomy for CABG. IMPRESSION: 1. The appearance of the chest suggests very  mild congestive heart failure, as discussed above. 2. Atherosclerosis. Electronically Signed   By: Vinnie Langton M.D.   On: 06/07/2015 16:46   I have personally reviewed and evaluated these images and lab results as part of my medical decision-making.   EKG Interpretation None      MDM   Final diagnoses:  None    Pt needs further workup outpatient to find outsecondary causes of her malignant hypertension.  Differentials for fatigue could be due to chronically elevated blood pressure, ESRD resulting in uremia, or occult blood loss.  Her hemoglobin is 12.0 and pt denies any hematochezia or melena so unlikely for this to be the cause Pt reports compliance with her dialysis session, but uremia due to underlying ESRD is also possible but given that she never misses her appointments , it is less likely.   Discussed with nephrology Dr Mercy Moore who thinks that it would be ok for the patient to wait till tomorrow for her dialysis session- There is no need for urgent dialysis at the moment.  We also gave her home antihypertensives and her pressure is down to 180s.  Burgess Estelle, MD 06/07/15 [id number]  Forde Dandy, MD 06/07/15 [phone]

## 2015-06-07 NOTE — ED Notes (Signed)
Contacted pharmacy to verify Labetalol and requested first two again.

## 2015-06-10 ENCOUNTER — Ambulatory Visit (INDEPENDENT_AMBULATORY_CARE_PROVIDER_SITE_OTHER): Payer: Medicare Other | Admitting: Cardiovascular Disease

## 2015-06-10 ENCOUNTER — Other Ambulatory Visit: Payer: Self-pay | Admitting: *Deleted

## 2015-06-10 ENCOUNTER — Encounter: Payer: Self-pay | Admitting: Cardiovascular Disease

## 2015-06-10 VITALS — BP 210/84 | HR 74 | Ht 61.0 in | Wt 136.0 lb

## 2015-06-10 DIAGNOSIS — Z992 Dependence on renal dialysis: Secondary | ICD-10-CM

## 2015-06-10 DIAGNOSIS — I25812 Atherosclerosis of bypass graft of coronary artery of transplanted heart without angina pectoris: Secondary | ICD-10-CM

## 2015-06-10 DIAGNOSIS — I5032 Chronic diastolic (congestive) heart failure: Secondary | ICD-10-CM

## 2015-06-10 DIAGNOSIS — N186 End stage renal disease: Secondary | ICD-10-CM

## 2015-06-10 DIAGNOSIS — I779 Disorder of arteries and arterioles, unspecified: Secondary | ICD-10-CM | POA: Diagnosis not present

## 2015-06-10 DIAGNOSIS — E7800 Pure hypercholesterolemia, unspecified: Secondary | ICD-10-CM

## 2015-06-10 DIAGNOSIS — I10 Essential (primary) hypertension: Secondary | ICD-10-CM

## 2015-06-10 DIAGNOSIS — I340 Nonrheumatic mitral (valve) insufficiency: Secondary | ICD-10-CM

## 2015-06-10 DIAGNOSIS — I739 Peripheral vascular disease, unspecified: Secondary | ICD-10-CM

## 2015-06-10 MED ORDER — HYDRALAZINE HCL 50 MG PO TABS: 100.0000 mg | ORAL_TABLET | Freq: Three times a day (TID) | ORAL | Status: DC

## 2015-06-10 MED ORDER — CLONIDINE HCL 0.3 MG PO TABS: 0.3000 mg | ORAL_TABLET | Freq: Two times a day (BID) | ORAL | Status: DC

## 2015-06-10 NOTE — Patient Instructions (Addendum)
Your physician has recommended you make the following change in your medication:  Increase hydralazine to 100 mg three times daily. Take (2) of your 50 mg tablets three times daily. Please take an extra 25 mg (1/2 of your 50 mg tablet) when you get to your car and begin the 100 mg dose tonight. Continue all other medications the same. Your physician recommends that you schedule a follow-up appointment in: 6 weeks.

## 2015-06-10 NOTE — Progress Notes (Signed)
Patient ID: RUT GILPATRICK, female   DOB: 1940-12-25, 74 y.o.   MRN: [id number]      SUBJECTIVE: Mrs. Petrovic returns for a follow up visit. In summary, she has a history of coronary artery disease status post CABG, malignant hypertension, hyperlipidemia, chronic diastolic heart failure, and end-stage renal disease on hemodialysis.   She had a normal nuclear cardiac stress test on 08/25/14, LVEF 57%.  Coronary angiography on 12/10/13 demonstrated a patent left main coronary artery, focal significant disease in the mid to distal left anterior descending artery, patent diagonal branch, LIMA to LAD was widely patent, severe restenosis of the proximal left circumflex artery stent, patent SVG to circumflex, 70% proximal ramus stenosis, widely patent SVG to ramus, occluded stent at the ostium of the right coronary artery, and widely patent SVG to distal RCA.  Echocardiogram on 11/27/13 demonstrated normal left ventricular systolic function, EF 0000000, moderate LVH, grade 1 diastolic dysfunction, elevated filling pressures, and mild to moderate mitral regurgitation.  Evaluated in the ED 3 days ago for generalized weakness. Chest xray showed "very mild CHF". No signs of infection. BP 214/84 in ED. BP 210/84 today.  ECG showed sinus rhythm with LVH and repolarization abnormalities.  Stopped taking clonidine on Sunday due to feeling drowsy. Has not happened before. Wants to get new bottle. Denies chest pain and shortness of breath. Dialyzed earlier today.   Review of Systems: As per "subjective", otherwise negative.  Allergies  Allergen Reactions  . Aspirin Other (See Comments)    Mess up her stomach; "makes my bowels have blood in them". Takes 81 mg EC Aspirin   . Contrast Media [Iodinated Diagnostic Agents] Itching  . Iron Itching and Other (See Comments)    "they gave me iron in dialysis; had to give me Benadryl cause I had to have the iron" (05/02/2012)  . Macrodantin [Nitrofurantoin Macrocrystal]  Other (See Comments)    "broke me out in big old knots all over my body; had to go to ER"  . Penicillins Other (See Comments)    "makes me real weak when I take it; like I'll pass out"  . Plavix [Clopidogrel Bisulfate] Rash  . Amlodipine Swelling    Leg swelling  . Bactrim [Sulfamethoxazole-Trimethoprim] Rash  . Dexilant [Dexlansoprazole] Other (See Comments)    Upset stomach  . Morphine And Related Itching    Itching in feet  . Nitrofurantoin Hives  . Prilosec [Omeprazole] Other (See Comments)    "back spasms"  . Sulfa Antibiotics Rash  . Venofer [Ferric Oxide] Itching    Patient reports using Benadryl prior to doses as [facility]  . Levaquin [Levofloxacin In D5w] Rash  . Protonix [Pantoprazole Sodium] Rash    Current Outpatient Prescriptions  Medication Sig Dispense Refill  . acetaminophen (TYLENOL) 325 MG tablet Take 2 tablets (650 mg total) by mouth every 6 (six) hours as needed for mild pain (mild pain).    Marland Kitchen ALPRAZolam (XANAX) 0.5 MG tablet Take 1 tablet (0.5 mg total) by mouth 3 (three) times daily. 30 tablet 1  . aspirin EC 81 MG tablet Take 81 mg by mouth daily.     . benzonatate (TESSALON PERLES) 100 MG capsule Take 1 capsule (100 mg total) by mouth 3 (three) times daily as needed for cough. 20 capsule 0  . cloNIDine (CATAPRES) 0.3 MG tablet Take 1 tablet (0.3 mg total) by mouth 2 (two) times daily. 60 tablet 6  . dextromethorphan-guaiFENesin (MUCINEX DM) 30-600 MG per 12 hr tablet Take  1 tablet by mouth daily. (Patient taking differently: Take 1 tablet by mouth 3 (three) times daily as needed. ) 14 tablet 1  . fluticasone (FLONASE) 50 MCG/ACT nasal spray Place 2 sprays into the nose daily.     . folic acid-vitamin b complex-vitamin c-selenium-zinc (DIALYVITE) 3 MG TABS Take 1 tablet by mouth daily.    . hydrALAZINE (APRESOLINE) 50 MG tablet Take 2 tablets (100 mg total) by mouth 3 (three) times daily. 540 tablet 1  . isosorbide dinitrate (ISORDIL) 20 MG tablet Take 20  mg by mouth 3 (three) times daily.    Marland Kitchen labetalol (NORMODYNE) 200 MG tablet Take 1.5 tablets (300 mg total) by mouth 2 (two) times daily. 60 tablet 1  . lanthanum (FOSRENOL) 1000 MG chewable tablet Chew 1,000 mg by mouth 3 (three) times daily after meals.    Marland Kitchen loratadine (CLARITIN) 10 MG tablet Take 10 mg by mouth daily.     . nitroGLYCERIN (NITROSTAT) 0.4 MG SL tablet Place 1 tablet (0.4 mg total) under the tongue every 5 (five) minutes x 3 doses as needed for chest pain. 25 tablet 3  . omeprazole (PRILOSEC) 20 MG capsule Take 1 capsule (20 mg total) by mouth daily. 30 capsule 5  . ondansetron (ZOFRAN-ODT) 4 MG disintegrating tablet Take 4 mg by mouth every 8 (eight) hours as needed. nausea    . polyethylene glycol (MIRALAX / GLYCOLAX) packet Take 17 g by mouth daily. (Patient taking differently: Take 17 g by mouth daily as needed for mild constipation or moderate constipation. ) 14 each 0  . PROAIR HFA 108 (90 BASE) MCG/ACT inhaler Inhale 1 puff into the lungs every 6 (six) hours as needed for wheezing or shortness of breath.     . SENSIPAR 30 MG tablet Take 30 mg by mouth daily with supper.     . simvastatin (ZOCOR) 20 MG tablet Take 1 tablet (20 mg total) by mouth at bedtime. 30 tablet 3  . tetrahydrozoline 0.05 % ophthalmic solution Place 1 drop into both eyes 2 (two) times daily as needed (irritation).     No current facility-administered medications for this visit.    Past Medical History  Diagnosis Date  . Chronic bronchitis ([place])   . GERD (gastroesophageal reflux disease)   . PUD (peptic ulcer disease)   . History of lower GI bleeding   . Arthritis   . History of gout   . CAD (coronary artery disease)     a. 12/2011 NSTEMI/Cath/PCI LCX (2.25x14 Resolute DES) & D1 (2.25x22 Resolute DES);  b. 01/2012 Cath/PCI: LM 30, LAD 30p, 40-63m, D1 stent ok, 99 in sm branch of diag, LCX patent stent, OM1 20, RCA 95 ost (4.0x12 Promus DES), EF 55%;  c. 04/2012 Lexi Cardiolite  EF 48%, small area of  scar @ base/mid inflat wall with mild peri-infarct ischemia.; CABG 12/4  . High cholesterol 12/2011  . Pneumonia ~ 2009  . Iron deficiency anemia   . TIA (transient ischemic attack)   . Anxiety   . History of blood transfusion 07/2011; 12/2011; 01/2012 X 2; 04/2012  . Carotid artery disease ([place])     a. [id number] LICA, [id number]   . Mitral regurgitation     a. Moderate by echo, 02/2012  . Myocardial infarction ([place])   . Chronic diastolic CHF (congestive heart failure) ([place])     a. 02/2012 Echo EF 60-65%, nl wall motion, Gr 1 DD, mod MR  . Hypertension   . AVM (arteriovenous malformation) of colon   .  Esophageal stricture   . Ovarian cancer ([place]) 1992  . Colon cancer ([place]) 1992  . ESRD on hemodialysis ([place])     ESRD due to HTN, started dialysis 2011 and gets HD at [facility] with Dr Hinda Lenis on MWF schedule.  Access is LUA AVF as of Sept 2014.     Past Surgical History  Procedure Laterality Date  . Abdominal hysterectomy  1992  . Appendectomy  06/1990  . Tubal ligation  1980's  . Av fistula placement  07/2009    left upper arm  . Thrombectomy / arteriovenous graft revision  2011    left upper arm  . Colon resection  1992  . Esophagogastroduodenoscopy  01/20/2012    Procedure: ESOPHAGOGASTRODUODENOSCOPY (EGD);  Surgeon: Ladene Artist, MD,FACG;  Location: [facility] ENDOSCOPY;  Service: Endoscopy;  Laterality: N/A;  . Dilation and curettage of uterus    . Coronary angioplasty with stent placement  12/15/11    "2"  . Coronary angioplasty with stent placement  y/2013    "1; makes total of 3" (05/02/2012)  . Coronary artery bypass graft  06/13/2012    Procedure: CORONARY ARTERY BYPASS GRAFTING (CABG);  Surgeon: Grace Isaac, MD;  Location: [place];  Service: Open Heart Surgery;  Laterality: N/A;  cabg x four;  using left internal mammary artery, and left leg greater saphenous vein harvested endoscopically  . Intraoperative transesophageal echocardiogram  06/13/2012    Procedure: INTRAOPERATIVE  TRANSESOPHAGEAL ECHOCARDIOGRAM;  Surgeon: Grace Isaac, MD;  Location: [place];  Service: Open Heart Surgery;  Laterality: N/A;  . Esophagogastroduodenoscopy N/A 03/26/2013    Procedure: ESOPHAGOGASTRODUODENOSCOPY (EGD);  Surgeon: Irene Shipper, MD;  Location: [facility] ENDOSCOPY;  Service: Endoscopy;  Laterality: N/A;  . Ovary surgery      ovarian cancer  . Cardiac surgery    . Left heart catheterization with coronary angiogram N/A 12/15/2011    Procedure: LEFT HEART CATHETERIZATION WITH CORONARY ANGIOGRAM;  Surgeon: Burnell Blanks, MD;  Location: [facility] CATH LAB;  Service: Cardiovascular;  Laterality: N/A;  . Left heart catheterization with coronary angiogram N/A 01/10/2012    Procedure: LEFT HEART CATHETERIZATION WITH CORONARY ANGIOGRAM;  Surgeon: Peter M Martinique, MD;  Location: [facility] CATH LAB;  Service: Cardiovascular;  Laterality: N/A;  . Left heart catheterization with coronary angiogram N/A 06/08/2012    Procedure: LEFT HEART CATHETERIZATION WITH CORONARY ANGIOGRAM;  Surgeon: Burnell Blanks, MD;  Location: [facility] CATH LAB;  Service: Cardiovascular;  Laterality: N/A;  . Shuntogram N/A 10/15/2013    Procedure: Fistulogram;  Surgeon: Serafina Mitchell, MD;  Location: [facility] CATH LAB;  Service: Cardiovascular;  Laterality: N/A;  . Left heart catheterization with coronary/graft angiogram N/A 12/10/2013    Procedure: LEFT HEART CATHETERIZATION WITH Beatrix Fetters;  Surgeon: [name], MD;  Location: [facility] CATH LAB;  Service: Cardiovascular;  Laterality: N/A;  . Esophagogastroduodenoscopy N/A 04/30/2015    Procedure: ESOPHAGOGASTRODUODENOSCOPY (EGD);  Surgeon: Rogene Houston, MD;  [facility] ENDO [facility];  Service: Endoscopy;  Laterality: N/A;  1pm - moved to 10/20 @ 1:10    Social History   Social History  . Marital Status: Married    Spouse Name: N/A  . Number of Children: N/A  . Years of Education: N/A   Occupational History  . Not on file.   Social History Main Topics  . Smoking  status: Never Smoker   . Smokeless tobacco: Never Used  . Alcohol Use: No  . Drug Use: No  . Sexual Activity: Yes  Birth Control/ Protection: Surgical   Other Topics Concern  . Not on file   Social History Narrative   Lives in [place], [place] with husband.  Dialysis pt - [facility].     Filed Vitals:   06/10/15 1536  BP: 210/84  Pulse: 74  Height: 5\' 1"  (1.549 m)  Weight: 136 lb (61.689 kg)  SpO2: 94%    PHYSICAL EXAM General: NAD HEENT: Normal. Neck: No JVD, no thyromegaly. Lungs: Clear to auscultation bilaterally with normal respiratory effort. CV: Nondisplaced PMI.  Regular rate and rhythm, normal S1/S2, no S3/S4, no murmur. No pretibial or periankle edema.   Abdomen: Soft, nontender, no distention.  Neurologic: Alert and oriented x 3.  Psych: Normal affect. Skin: Normal. Musculoskeletal: Normal range of motion, no gross deformities. Extremities: No clubbing or cyanosis.   ECG: Most recent ECG reviewed.      ASSESSMENT AND PLAN: 1. CAD s/p CABG: No chest pain. Normal nuclear stress test (08/25/14). I will continue ASA, labetalol, isosorbide dinitrate, and statin.   2. Malignant HTN: Markedly elevated onhydralazine 75 mg tid and labetalol 300 mg bid. Likely effect of rebound hypertension with sudden cessation of clonidine. She plans to get new bottle as this one caused drowsiness. Will increase hydralazine to 100 mg tid. I previously instructed her not to take the afternoon doses on dialysis days when her BP occasionally drops. Stopped amlodipine due to leg swelling.  3. Hyperlipidemia: Continue statin therapy.  4. Mitral regurgitation: Mild to moderate in 11/2013. Will monitor.  5. Carotid artery stenosis: Less than 40% bilaterally on 08/14/14. Continue surveillance monitoring.   Dispo: f/u 6 weeks with PA.   Kate Sable, M.D., F.A.C.C.

## 2015-06-14 NOTE — ED Provider Notes (Signed)
CSN: [id number]     Arrival date & time 04/30/15  1046 History   First MD Initiated Contact with Patient 05/03/15 1057     Chief Complaint  Patient presents with  . Chest Pain     (Consider location/radiation/quality/duration/timing/severity/associated sxs/prior Treatment) HPI  This is a 74 year old lady who has a history of end-stage renal disease on hemodialysis now presents with chest pain which began this morning.  Pain has been constant since it began. Began with shoulder pain on the right side.. She took 2 baby aspirin's and 3 sublingual nitroglycerin this morning with some relief. She feels that not enough fluid was pulled off at her last dialysis session which was 2 days ago. She denies any cough or fever or hemoptysis.  No difficulty breathing.  No increase in baseline leg swelling.  There are no other associated systemic symptoms, there are no other alleviating or modifying factors.   Past Medical History  Diagnosis Date  . Chronic bronchitis ([place])   . GERD (gastroesophageal reflux disease)   . PUD (peptic ulcer disease)   . History of lower GI bleeding   . Arthritis   . History of gout   . CAD (coronary artery disease)     a. 12/2011 NSTEMI/Cath/PCI LCX (2.25x14 Resolute DES) & D1 (2.25x22 Resolute DES);  b. 01/2012 Cath/PCI: LM 30, LAD 30p, 40-51m, D1 stent ok, 99 in sm branch of diag, LCX patent stent, OM1 20, RCA 95 ost (4.0x12 Promus DES), EF 55%;  c. 04/2012 Lexi Cardiolite  EF 48%, small area of scar @ base/mid inflat wall with mild peri-infarct ischemia.; CABG 12/4  . High cholesterol 12/2011  . Pneumonia ~ 2009  . Iron deficiency anemia   . TIA (transient ischemic attack)   . Anxiety   . History of blood transfusion 07/2011; 12/2011; 01/2012 X 2; 04/2012  . Carotid artery disease ([place])     a. [id number] LICA, [id number]   . Mitral regurgitation     a. Moderate by echo, 02/2012  . Myocardial infarction ([place])   . Chronic diastolic CHF (congestive heart failure) ([place])     a.  02/2012 Echo EF 60-65%, nl wall motion, Gr 1 DD, mod MR  . Hypertension   . AVM (arteriovenous malformation) of colon   . Esophageal stricture   . Ovarian cancer ([place]) 1992  . Colon cancer ([place]) 1992  . ESRD on hemodialysis ([place])     ESRD due to HTN, started dialysis 2011 and gets HD at [facility] with Dr Hinda Lenis on MWF schedule.  Access is LUA AVF as of Sept 2014.    Past Surgical History  Procedure Laterality Date  . Abdominal hysterectomy  1992  . Appendectomy  06/1990  . Tubal ligation  1980's  . Av fistula placement  07/2009    left upper arm  . Thrombectomy / arteriovenous graft revision  2011    left upper arm  . Colon resection  1992  . Esophagogastroduodenoscopy  01/20/2012    Procedure: ESOPHAGOGASTRODUODENOSCOPY (EGD);  Surgeon: Ladene Artist, MD,FACG;  Location: [facility] ENDOSCOPY;  Service: Endoscopy;  Laterality: N/A;  . Dilation and curettage of uterus    . Coronary angioplasty with stent placement  12/15/11    "2"  . Coronary angioplasty with stent placement  y/2013    "1; makes total of 3" (05/02/2012)  . Coronary artery bypass graft  06/13/2012    Procedure: CORONARY ARTERY BYPASS GRAFTING (CABG);  Surgeon: Grace Isaac, MD;  Location: [place];  Service: Open Heart Surgery;  Laterality: N/A;  cabg x four;  using left internal mammary artery, and left leg greater saphenous vein harvested endoscopically  . Intraoperative transesophageal echocardiogram  06/13/2012    Procedure: INTRAOPERATIVE TRANSESOPHAGEAL ECHOCARDIOGRAM;  Surgeon: Grace Isaac, MD;  Location: [place];  Service: Open Heart Surgery;  Laterality: N/A;  . Esophagogastroduodenoscopy N/A 03/26/2013    Procedure: ESOPHAGOGASTRODUODENOSCOPY (EGD);  Surgeon: Irene Shipper, MD;  Location: [facility] ENDOSCOPY;  Service: Endoscopy;  Laterality: N/A;  . Ovary surgery      ovarian cancer  . Cardiac surgery    . Left heart catheterization with coronary angiogram N/A 12/15/2011    Procedure: LEFT HEART CATHETERIZATION WITH  CORONARY ANGIOGRAM;  Surgeon: Burnell Blanks, MD;  Location: [facility] CATH LAB;  Service: Cardiovascular;  Laterality: N/A;  . Left heart catheterization with coronary angiogram N/A 01/10/2012    Procedure: LEFT HEART CATHETERIZATION WITH CORONARY ANGIOGRAM;  Surgeon: Peter M Martinique, MD;  Location: [facility] CATH LAB;  Service: Cardiovascular;  Laterality: N/A;  . Left heart catheterization with coronary angiogram N/A 06/08/2012    Procedure: LEFT HEART CATHETERIZATION WITH CORONARY ANGIOGRAM;  Surgeon: Burnell Blanks, MD;  Location: [facility] CATH LAB;  Service: Cardiovascular;  Laterality: N/A;  . Shuntogram N/A 10/15/2013    Procedure: Fistulogram;  Surgeon: Serafina Mitchell, MD;  Location: [facility] CATH LAB;  Service: Cardiovascular;  Laterality: N/A;  . Left heart catheterization with coronary/graft angiogram N/A 12/10/2013    Procedure: LEFT HEART CATHETERIZATION WITH Beatrix Fetters;  Surgeon: [name], MD;  Location: [facility] CATH LAB;  Service: Cardiovascular;  Laterality: N/A;  . Esophagogastroduodenoscopy N/A 04/30/2015    Procedure: ESOPHAGOGASTRODUODENOSCOPY (EGD);  Surgeon: Rogene Houston, MD;  [facility] END[facility];  Service: Endoscopy;  Laterality: N/A;  1pm - moved to 10/20 @ 1:10   Family History  Problem Relation Age of Onset  . Other      noncontributory for early CAD  . Heart disease Mother     Heart Disease before age 53  . Hyperlipidemia Mother   . Hypertension Mother   . Diabetes Mother   . Heart attack Mother   . Heart disease Father     Heart Disease before age 50  . Hyperlipidemia Father   . Hypertension Father   . Diabetes Father   . Diabetes Sister   . Hypertension Sister   . Diabetes Brother   . Hyperlipidemia Brother   . Heart attack Brother   . Colon cancer Neg Hx   . Esophageal cancer Neg Hx   . Liver disease Neg Hx   . Kidney disease Neg Hx   . Colon polyps Neg Hx   . Hypertension Sister   . Heart attack Brother    Social History  Substance  Use Topics  . Smoking status: Never Smoker   . Smokeless tobacco: Never Used  . Alcohol Use: No   OB History    No data available     Review of Systems  ROS reviewed and all otherwise negative except for mentioned in HPI    Allergies  Aspirin; Contrast media; Iron; Macrodantin; Penicillins; Plavix; Amlodipine; Bactrim; Dexilant; Morphine and related; Nitrofurantoin; Prilosec; Sulfa antibiotics; Venofer; Levaquin; and Protonix  Home Medications   Prior to Admission medications   Medication Sig Start Date End Date Taking? Authorizing Provider  acetaminophen (TYLENOL) 325 MG tablet Take 2 tablets (650 mg total) by mouth every 6 (six) hours as needed for mild pain (mild pain). 10/02/14  Yes Erline Hau, MD  ALPRAZolam Duanne Moron) 0.5 MG tablet Take 1 tablet (0.5 mg total) by mouth 3 (three) times daily. 01/27/15  Yes Theodis Blaze, MD  aspirin EC 81 MG tablet Take 81 mg by mouth daily.    Yes Historical Provider, MD  benzonatate (TESSALON PERLES) 100 MG capsule Take 1 capsule (100 mg total) by mouth 3 (three) times daily as needed for cough. 10/02/14  Yes Estela Leonie Green, MD  dextromethorphan-guaiFENesin [facility] DM) 30-600 MG per 12 hr tablet Take 1 tablet by mouth daily. Patient taking differently: Take 1 tablet by mouth 3 (three) times daily as needed.  09/29/14  Yes Fredia Sorrow, MD  fluticasone (FLONASE) 50 MCG/ACT nasal spray Place 2 sprays into the nose daily.    Yes Historical Provider, MD  folic acid-vitamin b complex-vitamin c-selenium-zinc (DIALYVITE) 3 MG TABS Take 1 tablet by mouth daily.   Yes Historical Provider, MD  isosorbide dinitrate (ISORDIL) 20 MG tablet Take 20 mg by mouth 3 (three) times daily. 01/15/15  Yes Historical Provider, MD  lanthanum (FOSRENOL) 1000 MG chewable tablet Chew 1,000 mg by mouth 3 (three) times daily after meals.   Yes Historical Provider, MD  loratadine (CLARITIN) 10 MG tablet Take 10 mg by mouth daily.    Yes Historical  Provider, MD  nitroGLYCERIN (NITROSTAT) 0.4 MG SL tablet Place 1 tablet (0.4 mg total) under the tongue every 5 (five) minutes x 3 doses as needed for chest pain. 06/23/14  Yes Herminio Commons, MD  polyethylene glycol (MIRALAX / GLYCOLAX) packet Take 17 g by mouth daily. Patient taking differently: Take 17 g by mouth daily as needed for mild constipation or moderate constipation.  10/02/14  Yes Estela Leonie Green, MD  PROAIR HFA 108 [phone] BASE) MCG/ACT inhaler Inhale 1 puff into the lungs every 6 (six) hours as needed for wheezing or shortness of breath.  01/09/15  Yes Historical Provider, MD  SENSIPAR 30 MG tablet Take 30 mg by mouth daily with supper.  12/21/12  Yes Historical Provider, MD  simvastatin (ZOCOR) 20 MG tablet Take 1 tablet (20 mg total) by mouth at bedtime. 11/04/14  Yes Herminio Commons, MD  tetrahydrozoline 0.05 % ophthalmic solution Place 1 drop into both eyes 2 (two) times daily as needed (irritation).   Yes Historical Provider, MD  cloNIDine (CATAPRES) 0.3 MG tablet Take 1 tablet (0.3 mg total) by mouth 2 (two) times daily. 06/10/15   Herminio Commons, MD  hydrALAZINE (APRESOLINE) 50 MG tablet Take 2 tablets (100 mg total) by mouth 3 (three) times daily. 06/10/15   Herminio Commons, MD  labetalol (NORMODYNE) 200 MG tablet Take 1.5 tablets (300 mg total) by mouth 2 (two) times daily. 05/05/15   Kathie Dike, MD  omeprazole (PRILOSEC) 20 MG capsule Take 1 capsule (20 mg total) by mouth daily. 05/12/15   Rona Ravens Setzer, NP  ondansetron (ZOFRAN-ODT) 4 MG disintegrating tablet Take 4 mg by mouth every 8 (eight) hours as needed. nausea 05/18/15   Historical Provider, MD   BP 156/70 mmHg  Pulse 76  Temp(Src) 98.5 F (36.9 C) (Oral)  Resp 21  Ht 5\' 2"  (1.575 m)  Wt 61.2 kg  BMI 24.67 kg/m2  SpO2 97%  Vitals reviewed Physical Exam  Physical Examination: General appearance - alert, well appearing, and in no distress Mental status - alert, oriented to person, place,  and time Eyes - no conjunctival injection no scleral icterus Mouth - mucous membranes moist,  pharynx normal without lesions Chest - clear to auscultation, no wheezes, rales or rhonchi, symmetric air entry, no reproducible chest pain Heart - normal rate, regular rhythm, normal S1, S2, no murmurs, rubs, clicks or gallops Abdomen - soft, nontender, nondistended, no masses or organomegaly Neurological - alert, oriented, normal speech Extremities - peripheral pulses normal, no pedal edema, no clubbing or cyanosis Skin - normal coloration and turgor, no rashes  ED Course  Procedures (including critical care time) Labs Review   Imaging Review No results found. I have personally reviewed and evaluated these images and lab results as part of my medical decision-making.   EKG Interpretation   Date/Time:  Sunday May 03 2015 10:58:03 EDT Ventricular Rate:  74 PR Interval:  155 QRS Duration: 101 QT Interval:  427 QTC Calculation: 474 R Axis:   54 Text Interpretation:  Sinus rhythm LVH with secondary repolarization  abnormality Anterior ST elevation, probably due to LVH ED PHYSICIAN  INTERPRETATION AVAILABLE IN [place] Confirmed by TEST, Record  ([id number]) on 05/07/2015 9:43:10 AM      MDM   Diagnosis:  Right shoulder pain ESRD on dialysis  Pt presenting with right shoulder pain, she is hypertensive.  Labs reassuring- potassium normal.  Pt has had 2 sets of troponin that are reassuring and as her pain has been ongoing over 6 hours this effectively rules out ACS.  Pt treated with IV labetalol with some improvement in her blood pressure.   Pt discharged with close followup with her PMD and cardiologist.  Discharged with strict return precautions.  Pt agreeable with plan.   Alfonzo Beers, MD 06/14/15 [id number]

## 2015-06-17 ENCOUNTER — Telehealth: Payer: Self-pay | Admitting: Cardiovascular Disease

## 2015-06-17 MED ORDER — LABETALOL HCL 200 MG PO TABS: 300.0000 mg | ORAL_TABLET | Freq: Two times a day (BID) | ORAL | Status: DC

## 2015-06-17 NOTE — Telephone Encounter (Signed)
Shawna [name] needs to have RX   labetalol (NORMODYNE) 200 MG tablet Take 1.5 tablets (300 mg total) by mouth 2 (two) times daily. 60 tablet       Called into [facility]. States that her dosage was increased and that she only has 4 tabs left.

## 2015-07-08 ENCOUNTER — Telehealth: Payer: Self-pay | Admitting: *Deleted

## 2015-07-08 NOTE — Telephone Encounter (Signed)
Pt says she didn't feel right after dialysis says chest pressure/discomfort, denied SOB/dizziness but pt checked HR was at 144 BP was 130/78 X 1 hour. Told pt to report to ED and to take nitroglycerin, pt says she had someone in her home to drive her there.

## 2015-07-13 DIAGNOSIS — Z992 Dependence on renal dialysis: Secondary | ICD-10-CM | POA: Diagnosis not present

## 2015-07-13 DIAGNOSIS — N186 End stage renal disease: Secondary | ICD-10-CM | POA: Diagnosis not present

## 2015-07-15 DIAGNOSIS — Z992 Dependence on renal dialysis: Secondary | ICD-10-CM | POA: Diagnosis not present

## 2015-07-15 DIAGNOSIS — N186 End stage renal disease: Secondary | ICD-10-CM | POA: Diagnosis not present

## 2015-07-17 DIAGNOSIS — Z992 Dependence on renal dialysis: Secondary | ICD-10-CM | POA: Diagnosis not present

## 2015-07-17 DIAGNOSIS — N186 End stage renal disease: Secondary | ICD-10-CM | POA: Diagnosis not present

## 2015-07-20 DIAGNOSIS — N186 End stage renal disease: Secondary | ICD-10-CM | POA: Diagnosis not present

## 2015-07-20 DIAGNOSIS — Z992 Dependence on renal dialysis: Secondary | ICD-10-CM | POA: Diagnosis not present

## 2015-07-22 ENCOUNTER — Other Ambulatory Visit: Payer: Self-pay | Admitting: Cardiovascular Disease

## 2015-07-22 DIAGNOSIS — Z992 Dependence on renal dialysis: Secondary | ICD-10-CM | POA: Diagnosis not present

## 2015-07-22 DIAGNOSIS — N186 End stage renal disease: Secondary | ICD-10-CM | POA: Diagnosis not present

## 2015-07-24 DIAGNOSIS — N186 End stage renal disease: Secondary | ICD-10-CM | POA: Diagnosis not present

## 2015-07-24 DIAGNOSIS — Z992 Dependence on renal dialysis: Secondary | ICD-10-CM | POA: Diagnosis not present

## 2015-07-27 DIAGNOSIS — Z992 Dependence on renal dialysis: Secondary | ICD-10-CM | POA: Diagnosis not present

## 2015-07-27 DIAGNOSIS — N186 End stage renal disease: Secondary | ICD-10-CM | POA: Diagnosis not present

## 2015-07-28 ENCOUNTER — Encounter: Payer: Self-pay | Admitting: Cardiovascular Disease

## 2015-07-28 ENCOUNTER — Ambulatory Visit (INDEPENDENT_AMBULATORY_CARE_PROVIDER_SITE_OTHER): Payer: Medicare HMO | Admitting: Cardiovascular Disease

## 2015-07-28 VITALS — BP 176/71 | HR 77 | Ht 61.0 in | Wt 140.0 lb

## 2015-07-28 DIAGNOSIS — I779 Disorder of arteries and arterioles, unspecified: Secondary | ICD-10-CM | POA: Diagnosis not present

## 2015-07-28 DIAGNOSIS — I5032 Chronic diastolic (congestive) heart failure: Secondary | ICD-10-CM | POA: Diagnosis not present

## 2015-07-28 DIAGNOSIS — N186 End stage renal disease: Secondary | ICD-10-CM

## 2015-07-28 DIAGNOSIS — R079 Chest pain, unspecified: Secondary | ICD-10-CM

## 2015-07-28 DIAGNOSIS — Z992 Dependence on renal dialysis: Secondary | ICD-10-CM

## 2015-07-28 DIAGNOSIS — I10 Essential (primary) hypertension: Secondary | ICD-10-CM | POA: Diagnosis not present

## 2015-07-28 DIAGNOSIS — I25768 Atherosclerosis of bypass graft of coronary artery of transplanted heart with other forms of angina pectoris: Secondary | ICD-10-CM | POA: Diagnosis not present

## 2015-07-28 DIAGNOSIS — I739 Peripheral vascular disease, unspecified: Secondary | ICD-10-CM

## 2015-07-28 DIAGNOSIS — E7800 Pure hypercholesterolemia, unspecified: Secondary | ICD-10-CM

## 2015-07-28 MED ORDER — ISOSORBIDE DINITRATE 30 MG PO TABS: 30.0000 mg | ORAL_TABLET | Freq: Three times a day (TID) | ORAL | Status: DC

## 2015-07-28 MED ORDER — AMLODIPINE BESYLATE 2.5 MG PO TABS: 2.5000 mg | ORAL_TABLET | Freq: Every day | ORAL | Status: DC

## 2015-07-28 NOTE — Patient Instructions (Signed)
   Begin Amlodipine 2.5mg  daily   Increase Isosorbide Dinitrate to 30mg  three times per day   New prescriptions sent on above to [facility] today. Continue all other medications.   Follow up in  3 months

## 2015-07-28 NOTE — Progress Notes (Signed)
Patient ID: Shawna [name], female   DOB: Mar 07, 1941, 75 y.o.   MRN: [id number]      SUBJECTIVE: Mrs. Bynum returns for a follow up visit. In summary, she has a history of coronary artery disease status post CABG, malignant hypertension, hyperlipidemia, chronic diastolic heart failure, and end-stage renal disease on hemodialysis.   She had a normal nuclear cardiac stress test on 08/25/14, LVEF 57%.  Coronary angiography on 12/10/13 demonstrated a patent left main coronary artery, focal significant disease in the mid to distal left anterior descending artery, patent diagonal branch, LIMA to LAD was widely patent, severe restenosis of the proximal left circumflex artery stent, patent SVG to circumflex, 70% proximal ramus stenosis, widely patent SVG to ramus, occluded stent at the ostium of the right coronary artery, and widely patent SVG to distal RCA.  Echocardiogram on 11/27/13 demonstrated normal left ventricular systolic function, EF 0000000, moderate LVH, grade 1 diastolic dysfunction, elevated filling pressures, and mild to moderate mitral regurgitation.   She recently returned from a trip to [place] visiting her grandchildren. Her blood pressures have been in the 99991111 systolic range. She has headaches and chest pain when this occurs. She previously developed leg swelling with amlodipine which resolved with its cessation.   Review of Systems: As per "subjective", otherwise negative.  Allergies  Allergen Reactions  . Aspirin Other (See Comments)    Mess up her stomach; "makes my bowels have blood in them". Takes 81 mg EC Aspirin   . Contrast Media [Iodinated Diagnostic Agents] Itching  . Iron Itching and Other (See Comments)    "they gave me iron in dialysis; had to give me Benadryl cause I had to have the iron" (05/02/2012)  . Macrodantin [Nitrofurantoin Macrocrystal] Other (See Comments)    "broke me out in big old knots all over my body; had to go to ER"  . Penicillins Other (See  Comments)    "makes me real weak when I take it; like I'll pass out"  . Plavix [Clopidogrel Bisulfate] Rash  . Amlodipine Swelling    Leg swelling  . Bactrim [Sulfamethoxazole-Trimethoprim] Rash  . Dexilant [Dexlansoprazole] Other (See Comments)    Upset stomach  . Morphine And Related Itching    Itching in feet  . Nitrofurantoin Hives  . Prilosec [Omeprazole] Other (See Comments)    "back spasms"  . Sulfa Antibiotics Rash  . Venofer [Ferric Oxide] Itching    Patient reports using Benadryl prior to doses as [facility]  . Levaquin [Levofloxacin In D5w] Rash  . Protonix [Pantoprazole Sodium] Rash    Current Outpatient Prescriptions  Medication Sig Dispense Refill  . acetaminophen (TYLENOL) 325 MG tablet Take 2 tablets (650 mg total) by mouth every 6 (six) hours as needed for mild pain (mild pain).    Marland Kitchen ALPRAZolam (XANAX) 0.5 MG tablet Take 1 tablet (0.5 mg total) by mouth 3 (three) times daily. 30 tablet 1  . aspirin EC 81 MG tablet Take 81 mg by mouth daily.     . cloNIDine (CATAPRES) 0.3 MG tablet Take 1 tablet (0.3 mg total) by mouth 2 (two) times daily. 60 tablet 6  . fluticasone (FLONASE) 50 MCG/ACT nasal spray Place 2 sprays into the nose daily.     . folic acid-vitamin b complex-vitamin c-selenium-zinc (DIALYVITE) 3 MG TABS Take 1 tablet by mouth daily.    . hydrALAZINE (APRESOLINE) 50 MG tablet Take 2 tablets (100 mg total) by mouth 3 (three) times daily. 540 tablet 1  . isosorbide  dinitrate (ISORDIL) 20 MG tablet Take 20 mg by mouth 3 (three) times daily.    Marland Kitchen labetalol (NORMODYNE) 200 MG tablet Take 1.5 tablets (300 mg total) by mouth 2 (two) times daily. 60 tablet 6  . lanthanum (FOSRENOL) 1000 MG chewable tablet Chew 1,000 mg by mouth 3 (three) times daily after meals.    Marland Kitchen loratadine (CLARITIN) 10 MG tablet Take 10 mg by mouth daily.     . nitroGLYCERIN (NITROSTAT) 0.4 MG SL tablet Place 1 tablet (0.4 mg total) under the tongue every 5 (five) minutes x 3 doses as  needed for chest pain. 25 tablet 3  . omeprazole (PRILOSEC) 20 MG capsule Take 1 capsule (20 mg total) by mouth daily. 30 capsule 5  . ondansetron (ZOFRAN-ODT) 4 MG disintegrating tablet Take 4 mg by mouth every 8 (eight) hours as needed. nausea    . polyethylene glycol (MIRALAX / GLYCOLAX) packet Take 17 g by mouth daily. (Patient taking differently: Take 17 g by mouth daily as needed for mild constipation or moderate constipation. ) 14 each 0  . PROAIR HFA 108 (90 BASE) MCG/ACT inhaler Inhale 1 puff into the lungs every 6 (six) hours as needed for wheezing or shortness of breath.     . SENSIPAR 30 MG tablet Take 30 mg by mouth daily with supper.     . simvastatin (ZOCOR) 20 MG tablet TAKE ONE TABLET BY MOUTH AT BEDTIME 30 tablet 6  . tetrahydrozoline 0.05 % ophthalmic solution Place 1 drop into both eyes 2 (two) times daily as needed (irritation).     No current facility-administered medications for this visit.    Past Medical History  Diagnosis Date  . Chronic bronchitis ([place])   . GERD (gastroesophageal reflux disease)   . PUD (peptic ulcer disease)   . History of lower GI bleeding   . Arthritis   . History of gout   . CAD (coronary artery disease)     a. 12/2011 NSTEMI/Cath/PCI LCX (2.25x14 Resolute DES) & D1 (2.25x22 Resolute DES);  b. 01/2012 Cath/PCI: LM 30, LAD 30p, 40-84m, D1 stent ok, 99 in sm branch of diag, LCX patent stent, OM1 20, RCA 95 ost (4.0x12 Promus DES), EF 55%;  c. 04/2012 Lexi Cardiolite  EF 48%, small area of scar @ base/mid inflat wall with mild peri-infarct ischemia.; CABG 12/4  . High cholesterol 12/2011  . Pneumonia ~ 2009  . Iron deficiency anemia   . TIA (transient ischemic attack)   . Anxiety   . History of blood transfusion 07/2011; 12/2011; 01/2012 X 2; 04/2012  . Carotid artery disease ([place])     a. [id number] LICA, [id number]   . Mitral regurgitation     a. Moderate by echo, 02/2012  . Myocardial infarction ([place])   . Chronic diastolic CHF (congestive heart  failure) ([place])     a. 02/2012 Echo EF 60-65%, nl wall motion, Gr 1 DD, mod MR  . Hypertension   . AVM (arteriovenous malformation) of colon   . Esophageal stricture   . Ovarian cancer ([place]) 1992  . Colon cancer ([place]) 1992  . ESRD on hemodialysis ([place])     ESRD due to HTN, started dialysis 2011 and gets HD [facility] with Dr Hinda Lenis on MWF schedule.  Access is LUA AVF as of Sept 2014.     Past Surgical History  Procedure Laterality Date  . Abdominal hysterectomy  1992  . Appendectomy  06/1990  . Tubal ligation  1980's  . Av fistula  placement  07/2009    left upper arm  . Thrombectomy / arteriovenous graft revision  2011    left upper arm  . Colon resection  1992  . Esophagogastroduodenoscopy  01/20/2012    Procedure: ESOPHAGOGASTRODUODENOSCOPY (EGD);  Surgeon: Ladene Artist, MD,FACG;  Location: [facility] ENDOSCOPY;  Service: Endoscopy;  Laterality: N/A;  . Dilation and curettage of uterus    . Coronary angioplasty with stent placement  12/15/11    "2"  . Coronary angioplasty with stent placement  y/2013    "1; makes total of 3" (05/02/2012)  . Coronary artery bypass graft  06/13/2012    Procedure: CORONARY ARTERY BYPASS GRAFTING (CABG);  Surgeon: Grace Isaac, MD;  Location: [facility];  Service: Open Heart Surgery;  Laterality: N/A;  cabg x four;  using left internal mammary artery, and left leg greater saphenous vein harvested endoscopically  . Intraoperative transesophageal echocardiogram  06/13/2012    Procedure: INTRAOPERATIVE TRANSESOPHAGEAL ECHOCARDIOGRAM;  Surgeon: Grace Isaac, MD;  Location: [facility];  Service: Open Heart Surgery;  Laterality: N/A;  . Esophagogastroduodenoscopy N/A 03/26/2013    Procedure: ESOPHAGOGASTRODUODENOSCOPY (EGD);  Surgeon: Irene Shipper, MD;  Location: [facility] ENDOSCOPY;  Service: Endoscopy;  Laterality: N/A;  . Ovary surgery      ovarian cancer  . Cardiac surgery    . Left heart catheterization with coronary angiogram N/A 12/15/2011    Procedure: LEFT  HEART CATHETERIZATION WITH CORONARY ANGIOGRAM;  Surgeon: Burnell Blanks, MD;  Location: [facility] CATH LAB;  Service: Cardiovascular;  Laterality: N/A;  . Left heart catheterization with coronary angiogram N/A 01/10/2012    Procedure: LEFT HEART CATHETERIZATION WITH CORONARY ANGIOGRAM;  Surgeon: Peter M Martinique, MD;  Location: [facility] CATH LAB;  Service: Cardiovascular;  Laterality: N/A;  . Left heart catheterization with coronary angiogram N/A 06/08/2012    Procedure: LEFT HEART CATHETERIZATION WITH CORONARY ANGIOGRAM;  Surgeon: Burnell Blanks, MD;  Location: [facility] CATH LAB;  Service: Cardiovascular;  Laterality: N/A;  . Shuntogram N/A 10/15/2013    Procedure: Fistulogram;  Surgeon: Serafina Mitchell, MD;  Location: [facility] CATH LAB;  Service: Cardiovascular;  Laterality: N/A;  . Left heart catheterization with coronary/graft angiogram N/A 12/10/2013    Procedure: LEFT HEART CATHETERIZATION WITH Beatrix Fetters;  Surgeon: [name], MD;  Location: [facility] CATH LAB;  Service: Cardiovascular;  Laterality: N/A;  . Esophagogastroduodenoscopy N/A 04/30/2015    Procedure: ESOPHAGOGASTRODUODENOSCOPY (EGD);  Surgeon: Rogene Houston, MD;  [facility] ENDO [facility];  Service: Endoscopy;  Laterality: N/A;  1pm - moved to 10/20 @ 1:10    Social History   Social History  . Marital Status: Married    Spouse Name: N/A  . Number of Children: N/A  . Years of Education: N/A   Occupational History  . Not on file.   Social History Main Topics  . Smoking status: Never Smoker   . Smokeless tobacco: Never Used  . Alcohol Use: No  . Drug Use: No  . Sexual Activity: Yes    Birth Control/ Protection: Surgical   Other Topics Concern  . Not on file   Social History Narrative   Lives in [place], [place] with husband.  Dialysis pt - [facility].     Filed Vitals:   07/28/15 1008  BP: 176/71  Pulse: 77  Height: 5\' 1"  (1.549 m)  Weight: 140 lb (63.504 kg)    PHYSICAL EXAM General: NAD HEENT: Normal. Neck: No  JVD, no thyromegaly. Lungs: Clear to auscultation bilaterally with normal respiratory effort.  CV: Nondisplaced PMI. Regular rate and rhythm, normal S1/S2, no S3/S4, no murmur. No pretibial or periankle edema.  Abdomen: Soft, nontender, no distention.  Neurologic: Alert and oriented x 3.  Psych: Normal affect. Skin: Normal. Musculoskeletal: Normal range of motion, no gross deformities. Extremities: No clubbing or cyanosis.   ECG: Most recent ECG reviewed.      ASSESSMENT AND PLAN: 1. Chest pain in context of CAD s/p CABG: Chest pain when BP is high. Normal nuclear stress test (08/25/14). I will continue ASA, labetalol, isosorbide dinitrate (increase to 30 mg tid), and statin.   2. Malignant HTN: Markedly elevated onhydralazine 100 mg tid, clonidine 0.3 mg bid, and labetalol 300 mg bid.  I previously instructed her not to take the afternoon doses on dialysis days when her BP occasionally drops. Stopped amlodipine due to leg swelling. Will try amlodipine again at low dose of 2.5 mg.  3. Hyperlipidemia: Continue statin therapy.  4. Mitral regurgitation: Mild to moderate in 11/2013. Will monitor.  5. Carotid artery stenosis: Less than 40% bilaterally on 08/14/14. Continue surveillance monitoring.   Dispo: f/u 3 months.  Kate Sable, M.D., F.A.C.C.

## 2015-07-29 DIAGNOSIS — Z992 Dependence on renal dialysis: Secondary | ICD-10-CM | POA: Diagnosis not present

## 2015-07-29 DIAGNOSIS — N186 End stage renal disease: Secondary | ICD-10-CM | POA: Diagnosis not present

## 2015-07-31 DIAGNOSIS — Z992 Dependence on renal dialysis: Secondary | ICD-10-CM | POA: Diagnosis not present

## 2015-07-31 DIAGNOSIS — N186 End stage renal disease: Secondary | ICD-10-CM | POA: Diagnosis not present

## 2015-08-03 DIAGNOSIS — N186 End stage renal disease: Secondary | ICD-10-CM | POA: Diagnosis not present

## 2015-08-03 DIAGNOSIS — Z992 Dependence on renal dialysis: Secondary | ICD-10-CM | POA: Diagnosis not present

## 2015-08-04 DIAGNOSIS — Z992 Dependence on renal dialysis: Secondary | ICD-10-CM | POA: Diagnosis not present

## 2015-08-04 DIAGNOSIS — N186 End stage renal disease: Secondary | ICD-10-CM | POA: Diagnosis not present

## 2015-08-04 DIAGNOSIS — E8770 Fluid overload, unspecified: Secondary | ICD-10-CM | POA: Diagnosis not present

## 2015-08-05 DIAGNOSIS — Z992 Dependence on renal dialysis: Secondary | ICD-10-CM | POA: Diagnosis not present

## 2015-08-05 DIAGNOSIS — N186 End stage renal disease: Secondary | ICD-10-CM | POA: Diagnosis not present

## 2015-08-07 DIAGNOSIS — Z992 Dependence on renal dialysis: Secondary | ICD-10-CM | POA: Diagnosis not present

## 2015-08-07 DIAGNOSIS — N186 End stage renal disease: Secondary | ICD-10-CM | POA: Diagnosis not present

## 2015-08-10 DIAGNOSIS — N186 End stage renal disease: Secondary | ICD-10-CM | POA: Diagnosis not present

## 2015-08-10 DIAGNOSIS — Z992 Dependence on renal dialysis: Secondary | ICD-10-CM | POA: Diagnosis not present

## 2015-08-11 DIAGNOSIS — Z992 Dependence on renal dialysis: Secondary | ICD-10-CM | POA: Diagnosis not present

## 2015-08-11 DIAGNOSIS — N186 End stage renal disease: Secondary | ICD-10-CM | POA: Diagnosis not present

## 2015-08-12 DIAGNOSIS — N186 End stage renal disease: Secondary | ICD-10-CM | POA: Diagnosis not present

## 2015-08-12 DIAGNOSIS — Z992 Dependence on renal dialysis: Secondary | ICD-10-CM | POA: Diagnosis not present

## 2015-08-13 ENCOUNTER — Ambulatory Visit (INDEPENDENT_AMBULATORY_CARE_PROVIDER_SITE_OTHER): Payer: Medicare HMO | Admitting: Internal Medicine

## 2015-08-13 ENCOUNTER — Encounter (INDEPENDENT_AMBULATORY_CARE_PROVIDER_SITE_OTHER): Payer: Self-pay | Admitting: Internal Medicine

## 2015-08-13 ENCOUNTER — Telehealth (INDEPENDENT_AMBULATORY_CARE_PROVIDER_SITE_OTHER): Payer: Self-pay | Admitting: *Deleted

## 2015-08-13 VITALS — BP 152/66 | HR 76 | Temp 98.0°F | Ht 61.0 in | Wt 140.7 lb

## 2015-08-13 DIAGNOSIS — K921 Melena: Secondary | ICD-10-CM

## 2015-08-13 DIAGNOSIS — K219 Gastro-esophageal reflux disease without esophagitis: Secondary | ICD-10-CM | POA: Diagnosis not present

## 2015-08-13 LAB — CBC WITH DIFFERENTIAL/PLATELET
BASOS ABS: 0.1 10*3/uL (ref 0.0–0.1)
Basophils Relative: 1 % (ref 0–1)
EOS ABS: 0.5 10*3/uL (ref 0.0–0.7)
Eosinophils Relative: 7 % — ABNORMAL HIGH (ref 0–5)
HEMATOCRIT: 33.4 % — AB (ref 36.0–46.0)
Hemoglobin: 10.7 g/dL — ABNORMAL LOW (ref 12.0–15.0)
Lymphocytes Relative: 9 % — ABNORMAL LOW (ref 12–46)
Lymphs Abs: 0.6 10*3/uL — ABNORMAL LOW (ref 0.7–4.0)
MCH: 31.8 pg (ref 26.0–34.0)
MCHC: 32 g/dL (ref 30.0–36.0)
MCV: 99.1 fL (ref 78.0–100.0)
MPV: 12.5 fL — AB (ref 8.6–12.4)
Monocytes Absolute: 0.6 10*3/uL (ref 0.1–1.0)
Monocytes Relative: 9 % (ref 3–12)
Neutro Abs: 5.1 10*3/uL (ref 1.7–7.7)
Neutrophils Relative %: 74 % (ref 43–77)
PLATELETS: 225 10*3/uL (ref 150–400)
RBC: 3.37 MIL/uL — ABNORMAL LOW (ref 3.87–5.11)
RDW: 16.3 % — ABNORMAL HIGH (ref 11.5–15.5)
WBC: 6.9 10*3/uL (ref 4.0–10.5)

## 2015-08-13 NOTE — Progress Notes (Signed)
Subjective:    Patient ID: Shawna [name], female    DOB: Apr 01, 1941, 75 y.o.   MRN: [id number]  HPI Here today for f/u. Underwent an EGD in October which revealed erosive gastroduodenitis but no evidence of PUD.  Underwent procedure for melena and abdominal pain. Hx of bleeding from duodenal AVM about 2 yrs ago.  Her appetite is good. No weight loss. She usually has a BM 2-3 times a day. No melena or BRRB. She says she did have a dark stool last week but it was not black.  She has a lot of flatus. She takes Gas X which helps. She is eating 2 meals a day. Hx of ESRD and takes dialysis M-W-F.  Dialysis for 5 yrs. Hx of cardiac stent per patient. CABG in 2013. Maintained on ASA 81mg .     CBC    Component Value Date/Time   WBC 7.9 06/07/2015 1710   RBC 4.06 06/07/2015 1710   HGB 12.4 06/07/2015 1710   HCT 40.0 06/07/2015 1710   PLT 247 06/07/2015 1710   MCV 98.5 06/07/2015 1710   MCH 30.5 06/07/2015 1710   MCHC 31.0 06/07/2015 1710   RDW 20.2* 06/07/2015 1710   LYMPHSABS 0.6* 05/27/2015 2140   MONOABS 0.6 05/27/2015 2140   EOSABS 0.4 05/27/2015 2140   BASOSABS 0.1 05/27/2015 2140       05/06/2015 Hemoglobin 11.7.    04/30/2015 Procedure: EGD  Indications: Patient is 75 year old African-American female with multiple medical problems who presents with abdominal pain and melena. She has history of bleeding from duodenal AV malformations about 2 years ago. She is on low-dose aspirin. Impression: Small sliding hiatal hernia with mild changes of reflux esophagitis limited to GE junction. Erosive gastroduodenitis but no evidence of peptic ulcer disease. Mild antral telangiectasia without stigmata of bleed.     03/26/2013 EGD and small bowel enteroscopy with control of bleeding (APC): melena Dr. Henrene Pastor: Non bleeding duodenal AVM status post APC. Incidental esophageal  stricture.  Her last colonoscopy was in January 2013 by Dr. Britta Mccreedy.  Anemia, Hemocult +. Two polyps in the cecum, Sessile polyp in the descending colon, Anastomosis. Biopsy: All tubular adenoma.  Review of Systems Past Medical History  Diagnosis Date  . Chronic bronchitis ([place])   . GERD (gastroesophageal reflux disease)   . PUD (peptic ulcer disease)   . History of lower GI bleeding   . Arthritis   . History of gout   . CAD (coronary artery disease)     a. 12/2011 NSTEMI/Cath/PCI LCX (2.25x14 Resolute DES) & D1 (2.25x22 Resolute DES);  b. 01/2012 Cath/PCI: LM 30, LAD 30p, 40-49m, D1 stent ok, 99 in sm branch of diag, LCX patent stent, OM1 20, RCA 95 ost (4.0x12 Promus DES), EF 55%;  c. 04/2012 Lexi Cardiolite  EF 48%, small area of scar @ base/mid inflat wall with mild peri-infarct ischemia.; CABG 12/4  . High cholesterol 12/2011  . Pneumonia ~ 2009  . Iron deficiency anemia   . TIA (transient ischemic attack)   . Anxiety   . History of blood transfusion 07/2011; 12/2011; 01/2012 X 2; 04/2012  . Carotid artery disease ([place])     a. A999333 LICA, [id number]   . Mitral regurgitation     a. Moderate by echo, 02/2012  . Myocardial infarction ([place])   . Chronic diastolic CHF (congestive heart failure) ([place])     a. 02/2012 Echo EF 60-65%, nl wall motion, Gr 1 DD, mod MR  . Hypertension   .  AVM (arteriovenous malformation) of colon   . Esophageal stricture   . Ovarian cancer ([place]) 1992  . Colon cancer ([place]) 1992  . ESRD on hemodialysis ([place])     ESRD due to HTN, started dialysis 2011 and gets HD at [facility] with Dr Hinda Lenis on MWF schedule.  Access is LUA AVF as of Sept 2014.     Past Surgical History  Procedure Laterality Date  . Abdominal hysterectomy  1992  . Appendectomy  06/1990  . Tubal ligation  1980's  . Av fistula placement  07/2009    left upper arm  . Thrombectomy / arteriovenous graft revision  2011    left upper arm  . Colon resection  1992  . Esophagogastroduodenoscopy   01/20/2012    Procedure: ESOPHAGOGASTRODUODENOSCOPY (EGD);  Surgeon: Ladene Artist, MD,FACG;  Location: [facility] ENDOSCOPY;  Service: Endoscopy;  Laterality: N/A;  . Dilation and curettage of uterus    . Coronary angioplasty with stent placement  12/15/11    "2"  . Coronary angioplasty with stent placement  y/2013    "1; makes total of 3" (05/02/2012)  . Coronary artery bypass graft  06/13/2012    Procedure: CORONARY ARTERY BYPASS GRAFTING (CABG);  Surgeon: Grace Isaac, MD;  Location: [facility];  Service: Open Heart Surgery;  Laterality: N/A;  cabg x four;  using left internal mammary artery, and left leg greater saphenous vein harvested endoscopically  . Intraoperative transesophageal echocardiogram  06/13/2012    Procedure: INTRAOPERATIVE TRANSESOPHAGEAL ECHOCARDIOGRAM;  Surgeon: Grace Isaac, MD;  Location: [place];  Service: Open Heart Surgery;  Laterality: N/A;  . Esophagogastroduodenoscopy N/A 03/26/2013    Procedure: ESOPHAGOGASTRODUODENOSCOPY (EGD);  Surgeon: Irene Shipper, MD;  Location: [facility] ENDOSCOPY;  Service: Endoscopy;  Laterality: N/A;  . Ovary surgery      ovarian cancer  . Cardiac surgery    . Left heart catheterization with coronary angiogram N/A 12/15/2011    Procedure: LEFT HEART CATHETERIZATION WITH CORONARY ANGIOGRAM;  Surgeon: Burnell Blanks, MD;  Location: [facility] CATH LAB;  Service: Cardiovascular;  Laterality: N/A;  . Left heart catheterization with coronary angiogram N/A 01/10/2012    Procedure: LEFT HEART CATHETERIZATION WITH CORONARY ANGIOGRAM;  Surgeon: Peter M Martinique, MD;  Location: [facility] CATH LAB;  Service: Cardiovascular;  Laterality: N/A;  . Left heart catheterization with coronary angiogram N/A 06/08/2012    Procedure: LEFT HEART CATHETERIZATION WITH CORONARY ANGIOGRAM;  Surgeon: Burnell Blanks, MD;  Location: [facility] CATH LAB;  Service: Cardiovascular;  Laterality: N/A;  . Shuntogram N/A 10/15/2013    Procedure: Fistulogram;  Surgeon: Serafina Mitchell, MD;  Location: [facility]  CATH LAB;  Service: Cardiovascular;  Laterality: N/A;  . Left heart catheterization with coronary/graft angiogram N/A 12/10/2013    Procedure: LEFT HEART CATHETERIZATION WITH Beatrix Fetters;  Surgeon: [name], MD;  Location: [facility] CATH LAB;  Service: Cardiovascular;  Laterality: N/A;  . Esophagogastroduodenoscopy N/A 04/30/2015    Procedure: ESOPHAGOGASTRODUODENOSCOPY (EGD);  Surgeon: Rogene Houston, MD;  [facility] ENDO [facility];  Service: Endoscopy;  Laterality: N/A;  1pm - moved to 10/20 @ 1:10    Allergies  Allergen Reactions  . Aspirin Other (See Comments)    Mess up her stomach; "makes my bowels have blood in them". Takes 81 mg EC Aspirin   . Contrast Media [Iodinated Diagnostic Agents] Itching  . Iron Itching and Other (See Comments)    "they gave me iron in dialysis; had to give me Benadryl cause I had to have  the iron" (05/02/2012)  . Macrodantin [Nitrofurantoin Macrocrystal] Other (See Comments)    "broke me out in big old knots all over my body; had to go to ER"  . Penicillins Other (See Comments)    "makes me real weak when I take it; like I'll pass out"  . Plavix [Clopidogrel Bisulfate] Rash  . Amlodipine Swelling    Leg swelling  . Bactrim [Sulfamethoxazole-Trimethoprim] Rash  . Dexilant [Dexlansoprazole] Other (See Comments)    Upset stomach  . Morphine And Related Itching    Itching in feet  . Nitrofurantoin Hives  . Prilosec [Omeprazole] Other (See Comments)    "back spasms"  . Sulfa Antibiotics Rash  . Venofer [Ferric Oxide] Itching    Patient reports using Benadryl prior to doses as [facility]  . Levaquin [Levofloxacin In D5w] Rash  . Protonix [Pantoprazole Sodium] Rash    Current Outpatient Prescriptions on File Prior to Visit  Medication Sig Dispense Refill  . acetaminophen (TYLENOL) 325 MG tablet Take 2 tablets (650 mg total) by mouth every 6 (six) hours as needed for mild pain (mild pain).    Marland Kitchen ALPRAZolam (XANAX) 0.5 MG tablet Take 1  tablet (0.5 mg total) by mouth 3 (three) times daily. 30 tablet 1  . amLODipine (NORVASC) 2.5 MG tablet Take 1 tablet (2.5 mg total) by mouth daily. 30 tablet 6  . aspirin EC 81 MG tablet Take 81 mg by mouth daily.     . cloNIDine (CATAPRES) 0.3 MG tablet Take 1 tablet (0.3 mg total) by mouth 2 (two) times daily. 60 tablet 6  . fluticasone (FLONASE) 50 MCG/ACT nasal spray Place 2 sprays into the nose daily.     . folic acid-vitamin b complex-vitamin c-selenium-zinc (DIALYVITE) 3 MG TABS Take 1 tablet by mouth daily.    . hydrALAZINE (APRESOLINE) 50 MG tablet Take 2 tablets (100 mg total) by mouth 3 (three) times daily. 540 tablet 1  . isosorbide dinitrate (ISORDIL) 30 MG tablet Take 1 tablet (30 mg total) by mouth 3 (three) times daily. 90 tablet 6  . labetalol (NORMODYNE) 200 MG tablet Take 1.5 tablets (300 mg total) by mouth 2 (two) times daily. 60 tablet 6  . lanthanum (FOSRENOL) 1000 MG chewable tablet Chew 1,000 mg by mouth 3 (three) times daily after meals.    Marland Kitchen loratadine (CLARITIN) 10 MG tablet Take 10 mg by mouth daily.     . nitroGLYCERIN (NITROSTAT) 0.4 MG SL tablet Place 1 tablet (0.4 mg total) under the tongue every 5 (five) minutes x 3 doses as needed for chest pain. 25 tablet 3  . omeprazole (PRILOSEC) 20 MG capsule Take 1 capsule (20 mg total) by mouth daily. 30 capsule 5  . ondansetron (ZOFRAN-ODT) 4 MG disintegrating tablet Take 4 mg by mouth every 8 (eight) hours as needed. nausea    . polyethylene glycol (MIRALAX / GLYCOLAX) packet Take 17 g by mouth daily. (Patient taking differently: Take 17 g by mouth daily as needed for mild constipation or moderate constipation. ) 14 each 0  . PROAIR HFA 108 (90 BASE) MCG/ACT inhaler Inhale 1 puff into the lungs every 6 (six) hours as needed for wheezing or shortness of breath.     . SENSIPAR 30 MG tablet Take 30 mg by mouth daily with supper.     . simvastatin (ZOCOR) 20 MG tablet TAKE ONE TABLET BY MOUTH AT BEDTIME 30 tablet 6  .  tetrahydrozoline 0.05 % ophthalmic solution Place 1 drop into both eyes  2 (two) times daily as needed (irritation).     No current facility-administered medications on file prior to visit.        Objective:   Physical Exam Blood pressure 152/66, pulse 76, temperature 98 F (36.7 C), height 5\' 1"  (1.549 m), weight 140 lb 11.2 oz (63.821 kg). Alert and oriented. Skin warm and dry. Oral mucosa is moist.   . Sclera anicteric, conjunctivae is pink. Thyroid not enlarged. No cervical lymphadenopathy. Lungs clear. Heart regular rate and rhythm.  Abdomen is soft. Bowel sounds are positive. No hepatomegaly. No abdominal masses felt. No tenderness.  No edema to lower extremities.          Assessment & Plan:  GERD. Her acid reflux is controlled with Omeprazole.  CBC today.

## 2015-08-13 NOTE — Telephone Encounter (Signed)
   Diagnosis:    Result(s)   Card 1: Positive          Completed by: Thomas Hoff ,LPN   HEMOCCULT SENSA DEVELOPER: LOT#:  [id number] EXPIRATION DATE: 9-17   HEMOCCULT SENSA CARD:  LOT#:  02/17 EXPIRATION DATE: 07/18   CARD CONTROL RESULTS:  POSITIVE: Positive NEGATIVE: Negative    ADDITIONAL COMMENTS: Forwarded to Karna Christmas

## 2015-08-13 NOTE — Patient Instructions (Signed)
Cbc today. OV in 6 months.

## 2015-08-14 ENCOUNTER — Encounter: Payer: Self-pay | Admitting: Family

## 2015-08-14 DIAGNOSIS — N186 End stage renal disease: Secondary | ICD-10-CM | POA: Diagnosis not present

## 2015-08-14 DIAGNOSIS — Z992 Dependence on renal dialysis: Secondary | ICD-10-CM | POA: Diagnosis not present

## 2015-08-17 DIAGNOSIS — N186 End stage renal disease: Secondary | ICD-10-CM | POA: Diagnosis not present

## 2015-08-17 DIAGNOSIS — Z992 Dependence on renal dialysis: Secondary | ICD-10-CM | POA: Diagnosis not present

## 2015-08-18 NOTE — Telephone Encounter (Signed)
Results given to patient. She will have repeat CBC in 2 weeks.

## 2015-08-19 DIAGNOSIS — N186 End stage renal disease: Secondary | ICD-10-CM | POA: Diagnosis not present

## 2015-08-19 DIAGNOSIS — Z992 Dependence on renal dialysis: Secondary | ICD-10-CM | POA: Diagnosis not present

## 2015-08-20 ENCOUNTER — Telehealth (INDEPENDENT_AMBULATORY_CARE_PROVIDER_SITE_OTHER): Payer: Self-pay | Admitting: Internal Medicine

## 2015-08-20 ENCOUNTER — Ambulatory Visit (HOSPITAL_COMMUNITY)
Admission: RE | Admit: 2015-08-20 | Discharge: 2015-08-20 | Disposition: A | Discharge status: Home / Self Care | Payer: Medicare HMO | Source: Ambulatory Visit | Attending: Family | Admitting: Family

## 2015-08-20 ENCOUNTER — Encounter (INDEPENDENT_AMBULATORY_CARE_PROVIDER_SITE_OTHER): Payer: Self-pay | Admitting: *Deleted

## 2015-08-20 ENCOUNTER — Encounter: Payer: Self-pay | Admitting: Family

## 2015-08-20 ENCOUNTER — Ambulatory Visit (INDEPENDENT_AMBULATORY_CARE_PROVIDER_SITE_OTHER): Payer: Medicare HMO | Admitting: Family

## 2015-08-20 ENCOUNTER — Telehealth (INDEPENDENT_AMBULATORY_CARE_PROVIDER_SITE_OTHER): Payer: Self-pay | Admitting: *Deleted

## 2015-08-20 ENCOUNTER — Ambulatory Visit (INDEPENDENT_AMBULATORY_CARE_PROVIDER_SITE_OTHER)
Admission: RE | Admit: 2015-08-20 | Discharge: 2015-08-20 | Disposition: A | Discharge status: Home / Self Care | Payer: Medicare HMO | Source: Ambulatory Visit | Attending: Family | Admitting: Family

## 2015-08-20 VITALS — BP 147/66 | HR 74 | Temp 97.6°F | Resp 16 | Ht 61.0 in | Wt 138.0 lb

## 2015-08-20 DIAGNOSIS — I779 Disorder of arteries and arterioles, unspecified: Secondary | ICD-10-CM

## 2015-08-20 DIAGNOSIS — N186 End stage renal disease: Secondary | ICD-10-CM

## 2015-08-20 DIAGNOSIS — Z992 Dependence on renal dialysis: Secondary | ICD-10-CM

## 2015-08-20 DIAGNOSIS — I739 Peripheral vascular disease, unspecified: Secondary | ICD-10-CM

## 2015-08-20 DIAGNOSIS — I6523 Occlusion and stenosis of bilateral carotid arteries: Secondary | ICD-10-CM | POA: Insufficient documentation

## 2015-08-20 DIAGNOSIS — E7800 Pure hypercholesterolemia, unspecified: Secondary | ICD-10-CM | POA: Diagnosis not present

## 2015-08-20 DIAGNOSIS — I770 Arteriovenous fistula, acquired: Secondary | ICD-10-CM

## 2015-08-20 DIAGNOSIS — K921 Melena: Secondary | ICD-10-CM

## 2015-08-20 DIAGNOSIS — D62 Acute posthemorrhagic anemia: Secondary | ICD-10-CM

## 2015-08-20 DIAGNOSIS — I120 Hypertensive chronic kidney disease with stage 5 chronic kidney disease or end stage renal disease: Secondary | ICD-10-CM | POA: Diagnosis not present

## 2015-08-20 NOTE — Telephone Encounter (Signed)
Shawna Christmas do you want her to come and get hemoccult cards or do you want to talk with her and do something else?

## 2015-08-20 NOTE — Progress Notes (Signed)
Filed Vitals:   08/20/15 1054 08/20/15 1101  BP: 162/75 147/66  Pulse: 80 74  Temp: 97.6 F (36.4 C)   TempSrc: Oral   Resp: 16   Height: 5\' 1"  (1.549 m)   Weight: 138 lb (62.596 kg)   SpO2: 96%

## 2015-08-20 NOTE — Addendum Note (Signed)
Addended by: Dorthula Rue L on: 08/20/2015 05:04 PM   Modules accepted: Orders

## 2015-08-20 NOTE — Patient Instructions (Signed)
Peripheral Vascular Disease Peripheral vascular disease (PVD) is a disease of the blood vessels that are not part of your heart and brain. A simple term for PVD is poor circulation. In most cases, PVD narrows the blood vessels that carry blood from your heart to the rest of your body. This can result in a decreased supply of blood to your arms, legs, and internal organs, like your stomach or kidneys. However, it most often affects a person's lower legs and feet. There are two types of PVD.  Organic PVD. This is the more common type. It is caused by damage to the structure of blood vessels.  Functional PVD. This is caused by conditions that make blood vessels contract and tighten (spasm). Without treatment, PVD tends to get worse over time. PVD can also lead to acute ischemic limb. This is when an arm or limb suddenly has trouble getting enough blood. This is a medical emergency. CAUSES Each type of PVD has many different causes. The most common cause of PVD is buildup of a fatty material (plaque) inside of your arteries (atherosclerosis). Small amounts of plaque can break off from the walls of the blood vessels and become lodged in a smaller artery. This blocks blood flow and can cause acute ischemic limb. Other common causes of PVD include:  Blood clots that form inside of blood vessels.  Injuries to blood vessels.  Diseases that cause inflammation of blood vessels or cause blood vessel spasms.  Health behaviors and health history that increase your risk of developing PVD. RISK FACTORS  You may have a greater risk of PVD if you:  Have a family history of PVD.  Have certain medical conditions, including:  High cholesterol.  Diabetes.  High blood pressure (hypertension).  Coronary heart disease.  Past problems with blood clots.  Past injury, such as burns or a broken bone. These may have damaged blood vessels in your limbs.  Buerger disease. This is caused by inflamed blood  vessels in your hands and feet.  Some forms of arthritis.  Rare birth defects that affect the arteries in your legs.  Use tobacco.  Do not get enough exercise.  Are obese.  Are age 50 or older. SIGNS AND SYMPTOMS  PVD may cause many different symptoms. Your symptoms depend on what part of your body is not getting enough blood. Some common signs and symptoms include:  Cramps in your lower legs. This may be a symptom of poor leg circulation (claudication).  Pain and weakness in your legs while you are physically active that goes away when you rest (intermittent claudication).  Leg pain when at rest.  Leg numbness, tingling, or weakness.  Coldness in a leg or foot, especially when compared with the other leg.  Skin or hair changes. These can include:  Hair loss.  Shiny skin.  Pale or bluish skin.  Thick toenails.  Inability to get or maintain an erection (erectile dysfunction). People with PVD are more prone to developing ulcers and sores on their toes, feet, or legs. These may take longer than normal to heal. DIAGNOSIS Your health care provider may diagnose PVD from your signs and symptoms. The health care provider will also do a physical exam. You may have tests to find out what is causing your PVD and determine its severity. Tests may include:  Blood pressure recordings from your arms and legs and measurements of the strength of your pulses (pulse volume recordings).  Imaging studies using sound waves to take pictures of   the blood flow through your blood vessels (Doppler ultrasound).  Injecting a dye into your blood vessels before having imaging studies using:  X-rays (angiogram or arteriogram).  Computer-generated X-rays (CT angiogram).  A powerful electromagnetic field and a computer (magnetic resonance angiogram or MRA). TREATMENT Treatment for PVD depends on the cause of your condition and the severity of your symptoms. It also depends on your age. Underlying  causes need to be treated and controlled. These include long-lasting (chronic) conditions, such as diabetes, high cholesterol, and high blood pressure. You may need to first try making lifestyle changes and taking medicines. Surgery may be needed if these do not work. Lifestyle changes may include:  Quitting smoking.  Exercising regularly.  Following a low-fat, low-cholesterol diet. Medicines may include:  Blood thinners to prevent blood clots.  Medicines to improve blood flow.  Medicines to improve your blood cholesterol levels. Surgical procedures may include:  A procedure that uses an inflated balloon to open a blocked artery and improve blood flow (angioplasty).  A procedure to put in a tube (stent) to keep a blocked artery open (stent implant).  Surgery to reroute blood flow around a blocked artery (peripheral bypass surgery).  Surgery to remove dead tissue from an infected wound on the affected limb.  Amputation. This is surgical removal of the affected limb. This may be necessary in cases of acute ischemic limb that are not improved through medical or surgical treatments. HOME CARE INSTRUCTIONS  Take medicines only as directed by your health care provider.  Do not use any tobacco products, including cigarettes, chewing tobacco, or electronic cigarettes. If you need help quitting, ask your health care provider.  Lose weight if you are overweight, and maintain a healthy weight as directed by your health care provider.  Eat a diet that is low in fat and cholesterol. If you need help, ask your health care provider.  Exercise regularly. Ask your health care provider to suggest some good activities for you.  Use compression stockings or other mechanical devices as directed by your health care provider.  Take good care of your feet.  Wear comfortable shoes that fit well.  Check your feet often for any cuts or sores. SEEK MEDICAL CARE IF:  You have cramps in your legs  while walking.  You have leg pain when you are at rest.  You have coldness in a leg or foot.  Your skin changes.  You have erectile dysfunction.  You have cuts or sores on your feet that are not healing. SEEK IMMEDIATE MEDICAL CARE IF:  Your arm or leg turns cold and blue.  Your arms or legs become red, warm, swollen, painful, or numb.  You have chest pain or trouble breathing.  You suddenly have weakness in your face, arm, or leg.  You become very confused or lose the ability to speak.  You suddenly have a very bad headache or lose your vision.   This information is not intended to replace advice given to you by your health care provider. Make sure you discuss any questions you have with your health care provider.   Document Released: 08/04/2004 Document Revised: 07/18/2014 Document Reviewed: 12/05/2013 [employer] Interactive Patient Education 2016 [employer]    Stroke Prevention Some medical conditions and behaviors are associated with an increased chance of having a stroke. You may prevent a stroke by making healthy choices and managing medical conditions. HOW CAN I REDUCE MY RISK OF HAVING A STROKE?   Stay physically active. Get at   least 30 minutes of activity on most or all days.  Do not smoke. It may also be helpful to avoid exposure to secondhand smoke.  Limit alcohol use. Moderate alcohol use is considered to be:  No more than 2 drinks per day for men.  No more than 1 drink per day for nonpregnant women.  Eat healthy foods. This involves:  Eating 5 or more servings of fruits and vegetables a day.  Making dietary changes that address high blood pressure (hypertension), high cholesterol, diabetes, or obesity.  Manage your cholesterol levels.  Making food choices that are high in fiber and low in saturated fat, trans fat, and cholesterol may control cholesterol levels.  Take any prescribed medicines to control cholesterol as directed by your health care  provider.  Manage your diabetes.  Controlling your carbohydrate and sugar intake is recommended to manage diabetes.  Take any prescribed medicines to control diabetes as directed by your health care provider.  Control your hypertension.  Making food choices that are low in salt (sodium), saturated fat, trans fat, and cholesterol is recommended to manage hypertension.  Ask your health care provider if you need treatment to lower your blood pressure. Take any prescribed medicines to control hypertension as directed by your health care provider.  If you are 18-39 years of age, have your blood pressure checked every 3-5 years. If you are 40 years of age or older, have your blood pressure checked every year.  Maintain a healthy weight.  Reducing calorie intake and making food choices that are low in sodium, saturated fat, trans fat, and cholesterol are recommended to manage weight.  Stop drug abuse.  Avoid taking birth control pills.  Talk to your health care provider about the risks of taking birth control pills if you are over 35 years old, smoke, get migraines, or have ever had a blood clot.  Get evaluated for sleep disorders (sleep apnea).  Talk to your health care provider about getting a sleep evaluation if you snore a lot or have excessive sleepiness.  Take medicines only as directed by your health care provider.  For some people, aspirin or blood thinners (anticoagulants) are helpful in reducing the risk of forming abnormal blood clots that can lead to stroke. If you have the irregular heart rhythm of atrial fibrillation, you should be on a blood thinner unless there is a good reason you cannot take them.  Understand all your medicine instructions.  Make sure that other conditions (such as anemia or atherosclerosis) are addressed. SEEK IMMEDIATE MEDICAL CARE IF:   You have sudden weakness or numbness of the face, arm, or leg, especially on one side of the body.  Your face  or eyelid droops to one side.  You have sudden confusion.  You have trouble speaking (aphasia) or understanding.  You have sudden trouble seeing in one or both eyes.  You have sudden trouble walking.  You have dizziness.  You have a loss of balance or coordination.  You have a sudden, severe headache with no known cause.  You have new chest pain or an irregular heartbeat. Any of these symptoms may represent a serious problem that is an emergency. Do not wait to see if the symptoms will go away. Get medical help at once. Call your local emergency services (911 in [place] Do not drive yourself to the hospital.   This information is not intended to replace advice given to you by your health care provider. Make sure you discuss any questions   you have with your health care provider.   Document Released: 08/04/2004 Document Revised: 07/18/2014 Document Reviewed: 12/28/2012 [employer] Interactive Patient Education 2016 [employer]  

## 2015-08-20 NOTE — Progress Notes (Signed)
[facility] [place] HISTORY AND PHYSICAL   MRN : [id number]  History of Present Illness:   Shawna [name] is a 75 y.o. female  patient of Dr. Oneida Alar who has been monitored for bilateral minimal internal carotid artery stenosis and PAD. She was initially referred by her PCP for auscultated neck bruit. She returns today for surveillance. She has not had carotid artery intervention nor LE intervention.  She has had a left AV fistula placement in her upper arm in 2011and dialyzes M-W-F. She reports that she receives iron transfusions every Monday during HD. She states she was told she had blood in her stool.  On 01/21/15 at [facility]: To IR for angio of fistula, found to be hypertensive. Became unresponsive and required intubation. Admitted to ICU. 7/18; [facility] assumed care. Pt states her left upper arm AVF is working well since the above procedure.   She had a CABG, 2 vessel, Dec. 2013. Pt denies claudication symptoms, denies non healing wounds. Pt states she had a TIA about 2009 as manifested by "my mouth was twisted" and transient expressive aphasia, denies any change in vision, denies hemiparesis, denies dysphagia.  Pt Diabetic: No Pt smoker: non-smoker  Pt meds include: Statin : yes ASA: Yes Other anticoagulants/antiplatelets: no    Current Outpatient Prescriptions  Medication Sig Dispense Refill  . acetaminophen (TYLENOL) 325 MG tablet Take 2 tablets (650 mg total) by mouth every 6 (six) hours as needed for mild pain (mild pain).    . Alendronate Sodium (FOSAMAX PO) Take 1,000 mg by mouth. After meals    . ALPRAZolam (XANAX) 0.5 MG tablet Take 1 tablet (0.5 mg total) by mouth 3 (three) times daily. 30 tablet 1  . amLODipine (NORVASC) 2.5 MG tablet Take 1 tablet (2.5 mg total) by mouth daily. 30 tablet 6  . aspirin EC 81 MG tablet Take 81 mg by mouth daily.     . cloNIDine (CATAPRES) 0.3 MG tablet Take 1 tablet (0.3 mg total) by mouth 2 (two) times daily. 60  tablet 6  . fluticasone (FLONASE) 50 MCG/ACT nasal spray Place 2 sprays into the nose daily.     . folic acid-vitamin b complex-vitamin c-selenium-zinc (DIALYVITE) 3 MG TABS Take 1 tablet by mouth daily.    . hydrALAZINE (APRESOLINE) 50 MG tablet Take 2 tablets (100 mg total) by mouth 3 (three) times daily. 540 tablet 1  . isosorbide dinitrate (ISORDIL) 30 MG tablet Take 1 tablet (30 mg total) by mouth 3 (three) times daily. 90 tablet 6  . labetalol (NORMODYNE) 200 MG tablet Take 1.5 tablets (300 mg total) by mouth 2 (two) times daily. 60 tablet 6  . lanthanum (FOSRENOL) 1000 MG chewable tablet Chew 1,000 mg by mouth 3 (three) times daily after meals.    Marland Kitchen loratadine (CLARITIN) 10 MG tablet Take 10 mg by mouth daily.     . nitroGLYCERIN (NITROSTAT) 0.4 MG SL tablet Place 1 tablet (0.4 mg total) under the tongue every 5 (five) minutes x 3 doses as needed for chest pain. 25 tablet 3  . omeprazole (PRILOSEC) 20 MG capsule Take 1 capsule (20 mg total) by mouth daily. 30 capsule 5  . ondansetron (ZOFRAN-ODT) 4 MG disintegrating tablet Take 4 mg by mouth every 8 (eight) hours as needed. nausea    . polyethylene glycol (MIRALAX / GLYCOLAX) packet Take 17 g by mouth daily. (Patient taking differently: Take 17 g by mouth daily as needed for mild constipation or moderate constipation. ) 14 each  0  . PROAIR HFA 108 (90 BASE) MCG/ACT inhaler Inhale 1 puff into the lungs every 6 (six) hours as needed for wheezing or shortness of breath.     . SENSIPAR 30 MG tablet Take 30 mg by mouth daily with supper.     . simvastatin (ZOCOR) 20 MG tablet TAKE ONE TABLET BY MOUTH AT BEDTIME 30 tablet 6  . tetrahydrozoline 0.05 % ophthalmic solution Place 1 drop into both eyes 2 (two) times daily as needed (irritation).     No current facility-administered medications for this visit.    Past Medical History  Diagnosis Date  . Chronic bronchitis ([place])   . GERD (gastroesophageal reflux disease)   . PUD (peptic ulcer  disease)   . History of lower GI bleeding   . Arthritis   . History of gout   . CAD (coronary artery disease)     a. 12/2011 NSTEMI/Cath/PCI LCX (2.25x14 Resolute DES) & D1 (2.25x22 Resolute DES);  b. 01/2012 Cath/PCI: LM 30, LAD 30p, 40-29m, D1 stent ok, 99 in sm branch of diag, LCX patent stent, OM1 20, RCA 95 ost (4.0x12 Promus DES), EF 55%;  c. 04/2012 Lexi Cardiolite  EF 48%, small area of scar @ base/mid inflat wall with mild peri-infarct ischemia.; CABG 12/4  . High cholesterol 12/2011  . Pneumonia ~ 2009  . Iron deficiency anemia   . TIA (transient ischemic attack)   . Anxiety   . History of blood transfusion 07/2011; 12/2011; 01/2012 X 2; 04/2012  . Carotid artery disease ([place])     a. [id number] LICA, [id number]   . Mitral regurgitation     a. Moderate by echo, 02/2012  . Myocardial infarction ([place])   . Chronic diastolic CHF (congestive heart failure) ([place])     a. 02/2012 Echo EF 60-65%, nl wall motion, Gr 1 DD, mod MR  . Hypertension   . AVM (arteriovenous malformation) of colon   . Esophageal stricture   . Ovarian cancer ([place]) 1992  . Colon cancer ([place]) 1992  . ESRD on hemodialysis ([place])     ESRD due to HTN, started dialysis 2011 and gets HD at [facility] with Dr Hinda Lenis on MWF schedule.  Access is LUA AVF as of Sept 2014.     Social History Social History  Substance Use Topics  . Smoking status: Never Smoker   . Smokeless tobacco: Never Used  . Alcohol Use: No    Family History Family History  Problem Relation Age of Onset  . Other      noncontributory for early CAD  . Heart disease Mother     Heart Disease before age 95  . Hyperlipidemia Mother   . Hypertension Mother   . Diabetes Mother   . Heart attack Mother   . Heart disease Father     Heart Disease before age 35  . Hyperlipidemia Father   . Hypertension Father   . Diabetes Father   . Diabetes Sister   . Hypertension Sister   . Diabetes Brother   . Hyperlipidemia Brother   . Heart attack Brother   . Colon  cancer Neg Hx   . Esophageal cancer Neg Hx   . Liver disease Neg Hx   . Kidney disease Neg Hx   . Colon polyps Neg Hx   . Hypertension Sister   . Heart attack Brother     Surgical History Past Surgical History  Procedure Laterality Date  . Abdominal hysterectomy  1992  . Appendectomy  06/1990  .  Tubal ligation  1980's  . Av fistula placement  07/2009    left upper arm  . Thrombectomy / arteriovenous graft revision  2011    left upper arm  . Colon resection  1992  . Esophagogastroduodenoscopy  01/20/2012    Procedure: ESOPHAGOGASTRODUODENOSCOPY (EGD);  Surgeon: Ladene Artist, MD,FACG;  Location: [facility] ENDOSCOPY;  Service: Endoscopy;  Laterality: N/A;  . Dilation and curettage of uterus    . Coronary angioplasty with stent placement  12/15/11    "2"  . Coronary angioplasty with stent placement  y/2013    "1; makes total of 3" (05/02/2012)  . Coronary artery bypass graft  06/13/2012    Procedure: CORONARY ARTERY BYPASS GRAFTING (CABG);  Surgeon: Grace Isaac, MD;  Location: [place];  Service: Open Heart Surgery;  Laterality: N/A;  cabg x four;  using left internal mammary artery, and left leg greater saphenous vein harvested endoscopically  . Intraoperative transesophageal echocardiogram  06/13/2012    Procedure: INTRAOPERATIVE TRANSESOPHAGEAL ECHOCARDIOGRAM;  Surgeon: Grace Isaac, MD;  Location: [place];  Service: Open Heart Surgery;  Laterality: N/A;  . Esophagogastroduodenoscopy N/A 03/26/2013    Procedure: ESOPHAGOGASTRODUODENOSCOPY (EGD);  Surgeon: Irene Shipper, MD;  Location[facility] ENDOSCOPY;  Service: Endoscopy;  Laterality: N/A;  . Ovary surgery      ovarian cancer  . Cardiac surgery    . Left heart catheterization with coronary angiogram N/A 12/15/2011    Procedure: LEFT HEART CATHETERIZATION WITH CORONARY ANGIOGRAM;  Surgeon: Burnell Blanks, MD;  Location: [facility] CATH LAB;  Service: Cardiovascular;  Laterality: N/A;  . Left heart catheterization with coronary angiogram N/A  01/10/2012    Procedure: LEFT HEART CATHETERIZATION WITH CORONARY ANGIOGRAM;  Surgeon: Peter M Martinique, MD;  Location: [facility] CATH LAB;  Service: Cardiovascular;  Laterality: N/A;  . Left heart catheterization with coronary angiogram N/A 06/08/2012    Procedure: LEFT HEART CATHETERIZATION WITH CORONARY ANGIOGRAM;  Surgeon: Burnell Blanks, MD;  Location: [facility] CATH LAB;  Service: Cardiovascular;  Laterality: N/A;  . Shuntogram N/A 10/15/2013    Procedure: Fistulogram;  Surgeon: Serafina Mitchell, MD;  Location: [facility] CATH LAB;  Service: Cardiovascular;  Laterality: N/A;  . Left heart catheterization with coronary/graft angiogram N/A 12/10/2013    Procedure: LEFT HEART CATHETERIZATION WITH Beatrix Fetters;  Surgeon: [name], MD;  Location: [facility] CATH LAB;  Service: Cardiovascular;  Laterality: N/A;  . Esophagogastroduodenoscopy N/A 04/30/2015    Procedure: ESOPHAGOGASTRODUODENOSCOPY (EGD);  Surgeon: Rogene [place], MD;  Location: [facility] ENDO [facility];  Service: Endoscopy;  Laterality: N/A;  1pm - moved to 10/20 @ 1:10    Allergies  Allergen Reactions  . Aspirin Other (See Comments)    Mess up her stomach; "makes my bowels have blood in them". Takes 81 mg EC Aspirin   . Contrast Media [Iodinated Diagnostic Agents] Itching  . Iron Itching and Other (See Comments)    "they gave me iron in dialysis; had to give me Benadryl cause I had to have the iron" (05/02/2012)  . Macrodantin [Nitrofurantoin Macrocrystal] Other (See Comments)    "broke me out in big old knots all over my body; had to go to ER"  . Penicillins Other (See Comments)    "makes me real weak when I take it; like I'll pass out"  . Plavix [Clopidogrel Bisulfate] Rash  . Amlodipine Swelling    Leg swelling  . Bactrim [Sulfamethoxazole-Trimethoprim] Rash  . Dexilant [Dexlansoprazole] Other (See Comments)    Upset stomach  . Morphine  And Related Itching    Itching in feet  . Nitrofurantoin Hives  . Prilosec [Omeprazole] Other  (See Comments)    "back spasms"  . Sulfa Antibiotics Rash  . Venofer [Ferric Oxide] Itching    Patient reports using Benadryl prior to doses as [facility]  . Levaquin [Levofloxacin In D5w] Rash  . Protonix [Pantoprazole Sodium] Rash    Current Outpatient Prescriptions  Medication Sig Dispense Refill  . acetaminophen (TYLENOL) 325 MG tablet Take 2 tablets (650 mg total) by mouth every 6 (six) hours as needed for mild pain (mild pain).    . Alendronate Sodium (FOSAMAX PO) Take 1,000 mg by mouth. After meals    . ALPRAZolam (XANAX) 0.5 MG tablet Take 1 tablet (0.5 mg total) by mouth 3 (three) times daily. 30 tablet 1  . amLODipine (NORVASC) 2.5 MG tablet Take 1 tablet (2.5 mg total) by mouth daily. 30 tablet 6  . aspirin EC 81 MG tablet Take 81 mg by mouth daily.     . cloNIDine (CATAPRES) 0.3 MG tablet Take 1 tablet (0.3 mg total) by mouth 2 (two) times daily. 60 tablet 6  . fluticasone (FLONASE) 50 MCG/ACT nasal spray Place 2 sprays into the nose daily.     . folic acid-vitamin b complex-vitamin c-selenium-zinc (DIALYVITE) 3 MG TABS Take 1 tablet by mouth daily.    . hydrALAZINE (APRESOLINE) 50 MG tablet Take 2 tablets (100 mg total) by mouth 3 (three) times daily. 540 tablet 1  . isosorbide dinitrate (ISORDIL) 30 MG tablet Take 1 tablet (30 mg total) by mouth 3 (three) times daily. 90 tablet 6  . labetalol (NORMODYNE) 200 MG tablet Take 1.5 tablets (300 mg total) by mouth 2 (two) times daily. 60 tablet 6  . lanthanum (FOSRENOL) 1000 MG chewable tablet Chew 1,000 mg by mouth 3 (three) times daily after meals.    Marland Kitchen loratadine (CLARITIN) 10 MG tablet Take 10 mg by mouth daily.     . nitroGLYCERIN (NITROSTAT) 0.4 MG SL tablet Place 1 tablet (0.4 mg total) under the tongue every 5 (five) minutes x 3 doses as needed for chest pain. 25 tablet 3  . omeprazole (PRILOSEC) 20 MG capsule Take 1 capsule (20 mg total) by mouth daily. 30 capsule 5  . ondansetron (ZOFRAN-ODT) 4 MG disintegrating tablet  Take 4 mg by mouth every 8 (eight) hours as needed. nausea    . polyethylene glycol (MIRALAX / GLYCOLAX) packet Take 17 g by mouth daily. (Patient taking differently: Take 17 g by mouth daily as needed for mild constipation or moderate constipation. ) 14 each 0  . PROAIR HFA 108 (90 BASE) MCG/ACT inhaler Inhale 1 puff into the lungs every 6 (six) hours as needed for wheezing or shortness of breath.     . SENSIPAR 30 MG tablet Take 30 mg by mouth daily with supper.     . simvastatin (ZOCOR) 20 MG tablet TAKE ONE TABLET BY MOUTH AT BEDTIME 30 tablet 6  . tetrahydrozoline 0.05 % ophthalmic solution Place 1 drop into both eyes 2 (two) times daily as needed (irritation).     No current facility-administered medications for this visit.     REVIEW OF SYSTEMS: See HPI for pertinent positives and negatives.  Physical Examination Filed Vitals:   08/20/15 1054 08/20/15 1101  BP: 162/75 147/66  Pulse: 80 74  Temp: 97.6 F (36.4 C)   TempSrc: Oral   Resp: 16   Height: 5\' 1"  (1.549 m)   Weight: 138  lb (62.596 kg)   SpO2: 96%    Body mass index is 26.09 kg/(m^2).  General: WDWN in NAD Gait: Normal HENT: WNL Eyes: Pupils equal Pulmonary: normal non-labored breathing, no rales, rhonchi,or wheezing Cardiac: RRR, no detected murmur.  Abdomen: soft, NT, no palpable masses Skin: no rashes, no ulcers, no cellulitis.Marland Kitchen   VASCULAR EXAM  Carotid Bruits Left Right   Negative Negative   Palpable thrill in left upper arm AV fistula.  Bilateral radial pulses are palpable. Aorta is not palpable   VASCULAR EXAM: Extremities without ischemic changes  without Gangrene; without open wounds.     LE Pulses LEFT RIGHT   FEMORAL 2+ palpable 2+ palpable   POPLITEAL not palpable  not palpable    POSTERIOR TIBIAL not palpable  not palpable    DORSALIS PEDIS  ANTERIOR TIBIAL faintly palpable  2+palpable     Musculoskeletal: no muscle wasting or atrophy; no peripheral edema Neurologic: A&O X 3; Appropriate Affect ;  SENSATION: normal; MOTOR FUNCTION: 5/5 Symmetric, CN 2-12 intact Speech is fluent/normal         Non-Invasive Vascular Imaging (08/20/2015):  Carotid Duplex: <40% stenosis of bilateral ICA.  No significant change in comparison to the last exam on 08/14/14.  ABI (Date: 08/20/2015)  R: 0.79 (0.84, 08/14/14), DP: biphasic, PT: biphasic, TBI: 0.62  L: 0.78 (0.83), DP: biphasic, PT: biphasic, TBI: 0.55   ASSESSMENT:  Shawna [name] is a 75 y.o. female who has not had a TIA since 2009. She has been monitored for bilateral minimal internal carotid artery stenosis and PAD. She has no claudication, no signs of ischemia in her feet/legs, is quite active. ABI's are slightly decreased from a year ago, all biphasic waveforms. Moderate arterial occlusive disease in both legs.   Carotid duplex suggests minimal bilateral extracranial internal carotid artery stenosis. No significant change in comparison to the last exam on 08/14/14.   PLAN:   Continued graduated walking program.  Based on today's exam and non-invasive vascular lab results, the patient will follow up in 1 year with the following tests: carotid Duplex and ABI's. I discussed in depth with the patient the nature of atherosclerosis, and emphasized the importance of maximal medical management including strict control of blood pressure, blood glucose, and lipid levels, obtaining regular exercise, and cessation of smoking.  The patient is aware that without maximal medical management the underlying atherosclerotic disease process will progress, limiting the benefit of any interventions.  The patient was given information about stroke prevention and what symptoms should prompt the patient  to seek immediate medical care.  The patient was given information about PAD including signs, symptoms, treatment, what symptoms should prompt the patient to seek immediate medical care, and risk reduction measures to take. Thank you for allowing [place] to participate in this patient's care.  Clemon Chambers, RN, MSN, FNP-C [facility] Office: [phone]  Clinic MD: [facility]  08/20/2015 11:09 AM

## 2015-08-20 NOTE — Telephone Encounter (Signed)
Patient states her stools are darker. She will bring a specimen by her tomorrow and also go to labs for a CBC.

## 2015-08-20 NOTE — Telephone Encounter (Signed)
.  Per Lelon Perla the patient is to have CBC in 2 weeks.

## 2015-08-20 NOTE — Telephone Encounter (Signed)
Ms. Summa called saying she's been having stomach pains recently and is noticing that the blood in her stool is darker. She's wondering if she can bring in another sample in a container from home, if she needs another appt, or to come to get a card from [place] for the sample. Please give her a call regarding this.  Pt's ph# [phone] Thank you.

## 2015-08-21 DIAGNOSIS — Z992 Dependence on renal dialysis: Secondary | ICD-10-CM | POA: Diagnosis not present

## 2015-08-21 DIAGNOSIS — N186 End stage renal disease: Secondary | ICD-10-CM | POA: Diagnosis not present

## 2015-08-22 DIAGNOSIS — J0101 Acute recurrent maxillary sinusitis: Secondary | ICD-10-CM | POA: Diagnosis not present

## 2015-08-22 LAB — CBC WITH DIFFERENTIAL/PLATELET
BASOS ABS: 0.1 10*3/uL (ref 0.0–0.1)
BASOS PCT: 1 % (ref 0–1)
EOS ABS: 0.5 10*3/uL (ref 0.0–0.7)
Eosinophils Relative: 9 % — ABNORMAL HIGH (ref 0–5)
HCT: 28.6 % — ABNORMAL LOW (ref 36.0–46.0)
Hemoglobin: 9.3 g/dL — ABNORMAL LOW (ref 12.0–15.0)
Lymphocytes Relative: 14 % (ref 12–46)
Lymphs Abs: 0.8 10*3/uL (ref 0.7–4.0)
MCH: 31.8 pg (ref 26.0–34.0)
MCHC: 32.5 g/dL (ref 30.0–36.0)
MCV: 97.9 fL (ref 78.0–100.0)
MONOS PCT: 8 % (ref 3–12)
MPV: 12.5 fL — AB (ref 8.6–12.4)
Monocytes Absolute: 0.4 10*3/uL (ref 0.1–1.0)
NEUTROS PCT: 68 % (ref 43–77)
Neutro Abs: 3.8 10*3/uL (ref 1.7–7.7)
PLATELETS: 239 10*3/uL (ref 150–400)
RBC: 2.92 MIL/uL — AB (ref 3.87–5.11)
RDW: 16.5 % — ABNORMAL HIGH (ref 11.5–15.5)
WBC: 5.6 10*3/uL (ref 4.0–10.5)

## 2015-08-24 ENCOUNTER — Telehealth (INDEPENDENT_AMBULATORY_CARE_PROVIDER_SITE_OTHER): Payer: Self-pay | Admitting: Internal Medicine

## 2015-08-24 ENCOUNTER — Other Ambulatory Visit (INDEPENDENT_AMBULATORY_CARE_PROVIDER_SITE_OTHER): Payer: Self-pay | Admitting: Internal Medicine

## 2015-08-24 DIAGNOSIS — Z992 Dependence on renal dialysis: Secondary | ICD-10-CM | POA: Diagnosis not present

## 2015-08-24 DIAGNOSIS — N186 End stage renal disease: Secondary | ICD-10-CM | POA: Diagnosis not present

## 2015-08-24 DIAGNOSIS — K2971 Gastritis, unspecified, with bleeding: Secondary | ICD-10-CM

## 2015-08-24 NOTE — Telephone Encounter (Signed)
Given procedure reviewed with patient.   Ann, Given Capsule., I discussed with patient. Please let OR know she will need a CBC before procedure.

## 2015-08-24 NOTE — Telephone Encounter (Signed)
Shawna [name] will be scheduled for a Given Capsule. She brought in a brown stool Friday 08/20/2014 and was guaiac positive .She has a drop in her hemoglobin and Dr. Laural Golden is aware.

## 2015-08-24 NOTE — Telephone Encounter (Signed)
No answer at home #

## 2015-08-25 ENCOUNTER — Other Ambulatory Visit (INDEPENDENT_AMBULATORY_CARE_PROVIDER_SITE_OTHER): Payer: Self-pay | Admitting: Internal Medicine

## 2015-08-25 ENCOUNTER — Other Ambulatory Visit (INDEPENDENT_AMBULATORY_CARE_PROVIDER_SITE_OTHER): Payer: Self-pay | Admitting: *Deleted

## 2015-08-25 DIAGNOSIS — K921 Melena: Secondary | ICD-10-CM

## 2015-08-25 DIAGNOSIS — R195 Other fecal abnormalities: Secondary | ICD-10-CM

## 2015-08-25 NOTE — Telephone Encounter (Signed)
GIVENS capsule sch'd 08/27/15, patient aware

## 2015-08-26 DIAGNOSIS — N186 End stage renal disease: Secondary | ICD-10-CM | POA: Diagnosis not present

## 2015-08-26 DIAGNOSIS — Z992 Dependence on renal dialysis: Secondary | ICD-10-CM | POA: Diagnosis not present

## 2015-08-26 NOTE — Telephone Encounter (Signed)
   Diagnosis:    Result(s)   Card 1: Positive           Completed by: Thomas Hoff , LPN  Done on [id number]   HEMOCCULT SENSA DEVELOPER: LOT#:  [id number] EXPIRATION DATE: 9-17   HEMOCCULT SENSA CARD:  LOT#:  [id number] EXPIRATION DATE: 07/18   CARD CONTROL RESULTS:  POSITIVE: Positive NEGATIVE: Negative    ADDITIONAL COMMENTS: Deberah Castle ,NP was made aware of this on 08/21/15.

## 2015-08-27 ENCOUNTER — Encounter (HOSPITAL_COMMUNITY): Admission: RE | Payer: Self-pay | Source: Ambulatory Visit

## 2015-08-27 ENCOUNTER — Ambulatory Visit (HOSPITAL_COMMUNITY): Admission: RE | Admit: 2015-08-27 | Payer: Medicare HMO | Source: Ambulatory Visit | Admitting: Internal Medicine

## 2015-08-27 SURGERY — IMAGING PROCEDURE, GI TRACT, INTRALUMINAL, VIA CAPSULE

## 2015-08-28 DIAGNOSIS — Z992 Dependence on renal dialysis: Secondary | ICD-10-CM | POA: Diagnosis not present

## 2015-08-28 DIAGNOSIS — N186 End stage renal disease: Secondary | ICD-10-CM | POA: Diagnosis not present

## 2015-08-30 ENCOUNTER — Observation Stay (HOSPITAL_COMMUNITY)
Admission: EM | Admit: 2015-08-30 | Discharge: 2015-09-03 | Disposition: A | Discharge status: Home / Self Care | Payer: Medicare HMO | Attending: Internal Medicine | Admitting: Internal Medicine

## 2015-08-30 ENCOUNTER — Emergency Department (HOSPITAL_COMMUNITY): Payer: Medicare HMO

## 2015-08-30 ENCOUNTER — Encounter (HOSPITAL_COMMUNITY): Payer: Self-pay | Admitting: Emergency Medicine

## 2015-08-30 DIAGNOSIS — Z8711 Personal history of peptic ulcer disease: Secondary | ICD-10-CM | POA: Diagnosis not present

## 2015-08-30 DIAGNOSIS — N185 Chronic kidney disease, stage 5: Secondary | ICD-10-CM | POA: Diagnosis not present

## 2015-08-30 DIAGNOSIS — Z79899 Other long term (current) drug therapy: Secondary | ICD-10-CM | POA: Diagnosis not present

## 2015-08-30 DIAGNOSIS — M542 Cervicalgia: Secondary | ICD-10-CM | POA: Diagnosis not present

## 2015-08-30 DIAGNOSIS — Z7982 Long term (current) use of aspirin: Secondary | ICD-10-CM | POA: Insufficient documentation

## 2015-08-30 DIAGNOSIS — J111 Influenza due to unidentified influenza virus with other respiratory manifestations: Principal | ICD-10-CM | POA: Diagnosis present

## 2015-08-30 DIAGNOSIS — F419 Anxiety disorder, unspecified: Secondary | ICD-10-CM | POA: Diagnosis not present

## 2015-08-30 DIAGNOSIS — N186 End stage renal disease: Secondary | ICD-10-CM | POA: Diagnosis not present

## 2015-08-30 DIAGNOSIS — Z87738 Personal history of other specified (corrected) congenital malformations of digestive system: Secondary | ICD-10-CM | POA: Insufficient documentation

## 2015-08-30 DIAGNOSIS — R51 Headache: Secondary | ICD-10-CM | POA: Diagnosis not present

## 2015-08-30 DIAGNOSIS — Z992 Dependence on renal dialysis: Secondary | ICD-10-CM | POA: Diagnosis not present

## 2015-08-30 DIAGNOSIS — Z8673 Personal history of transient ischemic attack (TIA), and cerebral infarction without residual deficits: Secondary | ICD-10-CM | POA: Diagnosis not present

## 2015-08-30 DIAGNOSIS — Z8543 Personal history of malignant neoplasm of ovary: Secondary | ICD-10-CM | POA: Insufficient documentation

## 2015-08-30 DIAGNOSIS — N189 Chronic kidney disease, unspecified: Secondary | ICD-10-CM

## 2015-08-30 DIAGNOSIS — M1990 Unspecified osteoarthritis, unspecified site: Secondary | ICD-10-CM | POA: Diagnosis not present

## 2015-08-30 DIAGNOSIS — K219 Gastro-esophageal reflux disease without esophagitis: Secondary | ICD-10-CM | POA: Diagnosis not present

## 2015-08-30 DIAGNOSIS — Z7951 Long term (current) use of inhaled steroids: Secondary | ICD-10-CM | POA: Diagnosis not present

## 2015-08-30 DIAGNOSIS — J42 Unspecified chronic bronchitis: Secondary | ICD-10-CM | POA: Insufficient documentation

## 2015-08-30 DIAGNOSIS — D631 Anemia in chronic kidney disease: Secondary | ICD-10-CM | POA: Diagnosis not present

## 2015-08-30 DIAGNOSIS — E7800 Pure hypercholesterolemia, unspecified: Secondary | ICD-10-CM | POA: Diagnosis not present

## 2015-08-30 DIAGNOSIS — I120 Hypertensive chronic kidney disease with stage 5 chronic kidney disease or end stage renal disease: Secondary | ICD-10-CM | POA: Diagnosis not present

## 2015-08-30 DIAGNOSIS — J101 Influenza due to other identified influenza virus with other respiratory manifestations: Secondary | ICD-10-CM | POA: Diagnosis not present

## 2015-08-30 DIAGNOSIS — I340 Nonrheumatic mitral (valve) insufficiency: Secondary | ICD-10-CM | POA: Diagnosis not present

## 2015-08-30 DIAGNOSIS — I252 Old myocardial infarction: Secondary | ICD-10-CM | POA: Diagnosis not present

## 2015-08-30 DIAGNOSIS — G9340 Encephalopathy, unspecified: Secondary | ICD-10-CM

## 2015-08-30 DIAGNOSIS — Z8701 Personal history of pneumonia (recurrent): Secondary | ICD-10-CM | POA: Insufficient documentation

## 2015-08-30 DIAGNOSIS — I779 Disorder of arteries and arterioles, unspecified: Secondary | ICD-10-CM | POA: Insufficient documentation

## 2015-08-30 DIAGNOSIS — I5032 Chronic diastolic (congestive) heart failure: Secondary | ICD-10-CM | POA: Insufficient documentation

## 2015-08-30 DIAGNOSIS — I2510 Atherosclerotic heart disease of native coronary artery without angina pectoris: Secondary | ICD-10-CM | POA: Diagnosis not present

## 2015-08-30 DIAGNOSIS — K222 Esophageal obstruction: Secondary | ICD-10-CM | POA: Diagnosis not present

## 2015-08-30 DIAGNOSIS — Z9889 Other specified postprocedural states: Secondary | ICD-10-CM | POA: Diagnosis not present

## 2015-08-30 DIAGNOSIS — Z880 Allergy status to penicillin: Secondary | ICD-10-CM | POA: Diagnosis not present

## 2015-08-30 DIAGNOSIS — R69 Illness, unspecified: Secondary | ICD-10-CM | POA: Diagnosis not present

## 2015-08-30 DIAGNOSIS — Z85038 Personal history of other malignant neoplasm of large intestine: Secondary | ICD-10-CM | POA: Diagnosis not present

## 2015-08-30 DIAGNOSIS — R05 Cough: Secondary | ICD-10-CM | POA: Diagnosis present

## 2015-08-30 DIAGNOSIS — I517 Cardiomegaly: Secondary | ICD-10-CM | POA: Diagnosis not present

## 2015-08-30 LAB — COMPREHENSIVE METABOLIC PANEL
ALK PHOS: 130 U/L — AB (ref 38–126)
ALT: 17 U/L (ref 14–54)
AST: 26 U/L (ref 15–41)
Albumin: 4 g/dL (ref 3.5–5.0)
Anion gap: 17 — ABNORMAL HIGH (ref 5–15)
BUN: 53 mg/dL — ABNORMAL HIGH (ref 6–20)
CALCIUM: 8.8 mg/dL — AB (ref 8.9–10.3)
CO2: 25 mmol/L (ref 22–32)
CREATININE: 10.69 mg/dL — AB (ref 0.44–1.00)
Chloride: 101 mmol/L (ref 101–111)
GFR, EST AFRICAN AMERICAN: 4 mL/min — AB (ref >60)
GFR, EST NON AFRICAN AMERICAN: 3 mL/min — AB (ref >60)
Glucose, Bld: 107 mg/dL — ABNORMAL HIGH (ref 65–99)
Potassium: 5.1 mmol/L (ref 3.5–5.1)
Sodium: 143 mmol/L (ref 135–145)
TOTAL PROTEIN: 6.9 g/dL (ref 6.5–8.1)
Total Bilirubin: 0.4 mg/dL (ref 0.3–1.2)

## 2015-08-30 LAB — CBC WITH DIFFERENTIAL/PLATELET
Basophils Absolute: 0 10*3/uL (ref 0.0–0.1)
Basophils Relative: 0 %
EOS PCT: 8 %
Eosinophils Absolute: 0.8 10*3/uL — ABNORMAL HIGH (ref 0.0–0.7)
HCT: 26.9 % — ABNORMAL LOW (ref 36.0–46.0)
HEMOGLOBIN: 8.7 g/dL — AB (ref 12.0–15.0)
LYMPHS ABS: 0.5 10*3/uL — AB (ref 0.7–4.0)
LYMPHS PCT: 4 %
MCH: 32.7 pg (ref 26.0–34.0)
MCHC: 32.3 g/dL (ref 30.0–36.0)
MCV: 101.1 fL — AB (ref 78.0–100.0)
MONOS PCT: 3 %
Monocytes Absolute: 0.3 10*3/uL (ref 0.1–1.0)
NEUTROS PCT: 85 %
Neutro Abs: 9.4 10*3/uL — ABNORMAL HIGH (ref 1.7–7.7)
Platelets: 217 10*3/uL (ref 150–400)
RBC: 2.66 MIL/uL — AB (ref 3.87–5.11)
RDW: 16.3 % — ABNORMAL HIGH (ref 11.5–15.5)
WBC: 11 10*3/uL — AB (ref 4.0–10.5)

## 2015-08-30 LAB — INFLUENZA PANEL BY PCR (TYPE A & B)
H1N1 flu by pcr: NOT DETECTED
Influenza A By PCR: POSITIVE — AB
Influenza B By PCR: NEGATIVE

## 2015-08-30 LAB — I-STAT CG4 LACTIC ACID, ED: LACTIC ACID, VENOUS: 2.01 mmol/L — AB (ref 0.5–2.0)

## 2015-08-30 IMAGING — DX DG CHEST 2V
2 series · 2 of 2 positions shown · non-contrast
Comparison: [DATE]

CLINICAL DATA: Insert [REDACTED] study

EXAM:
CHEST  2 VIEW

[chest pa]
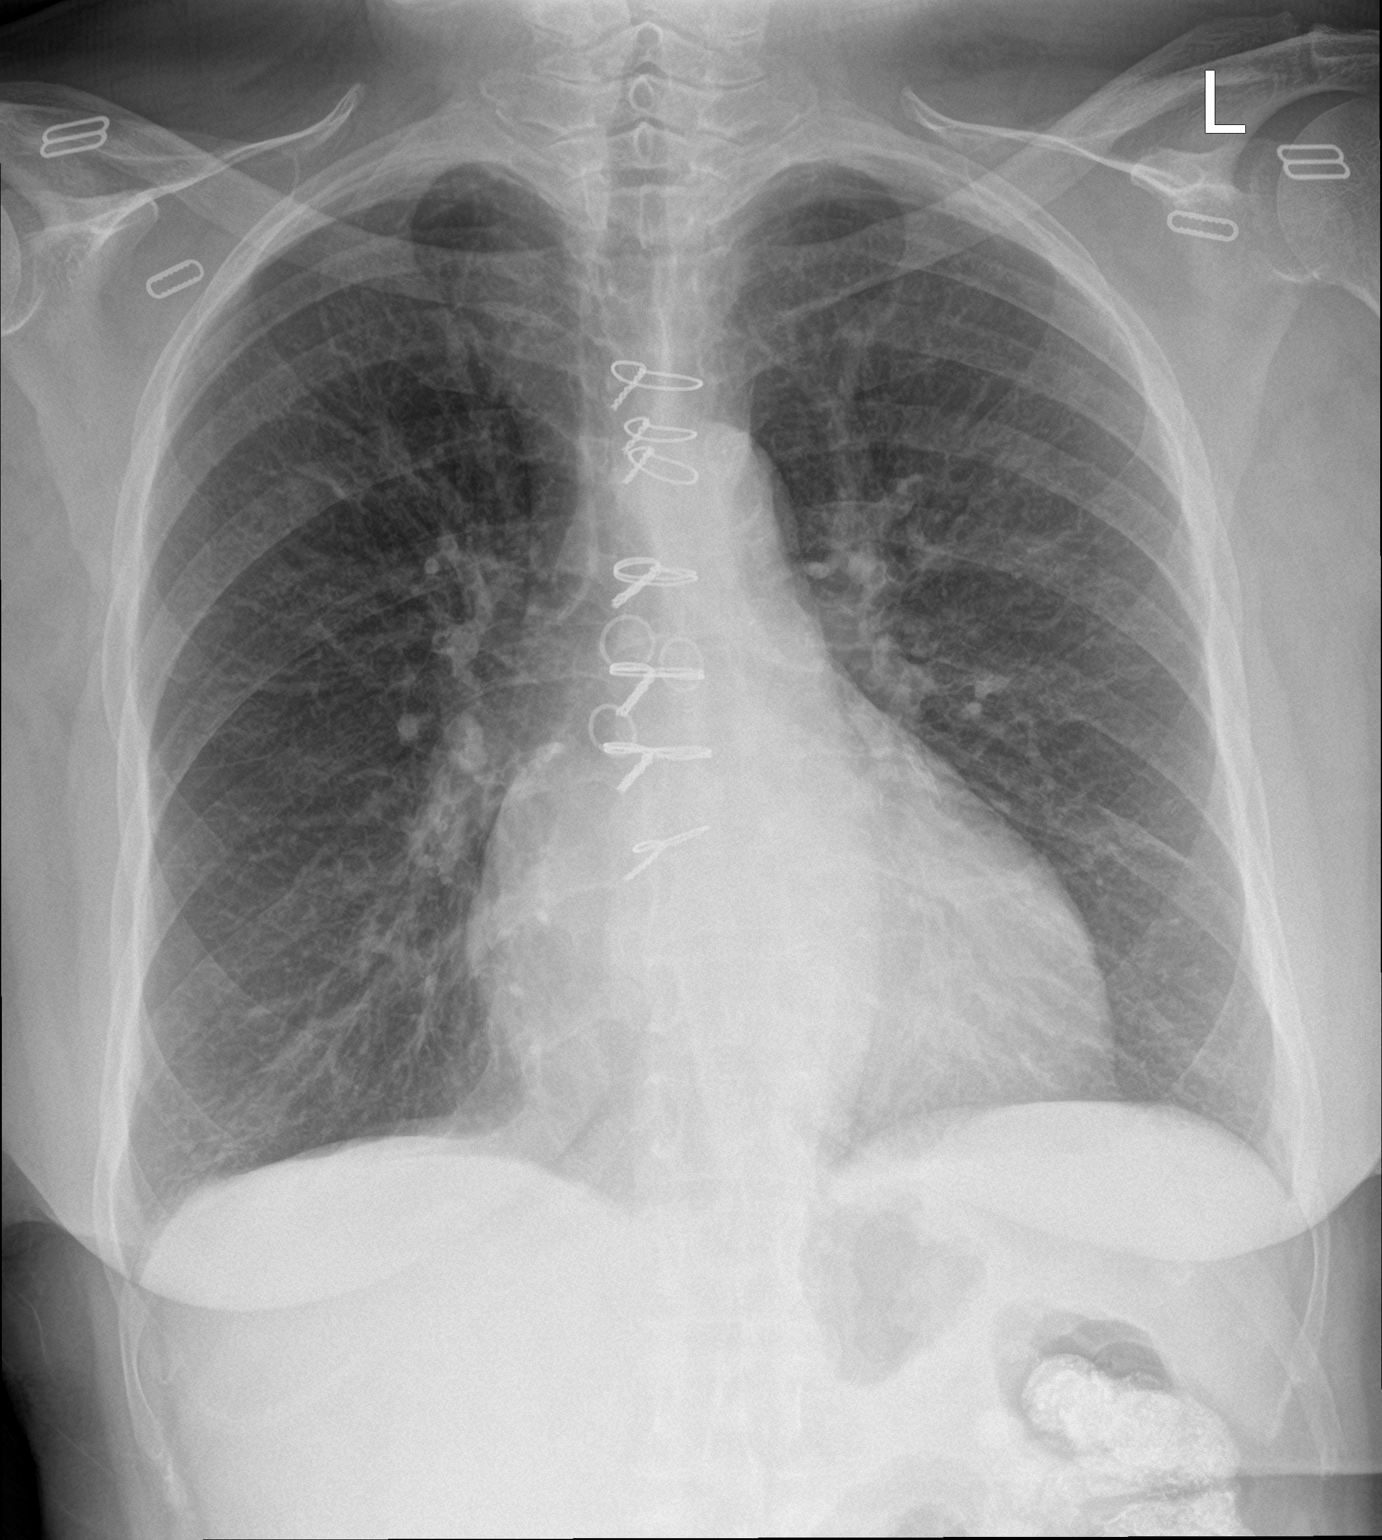

[chest lat]
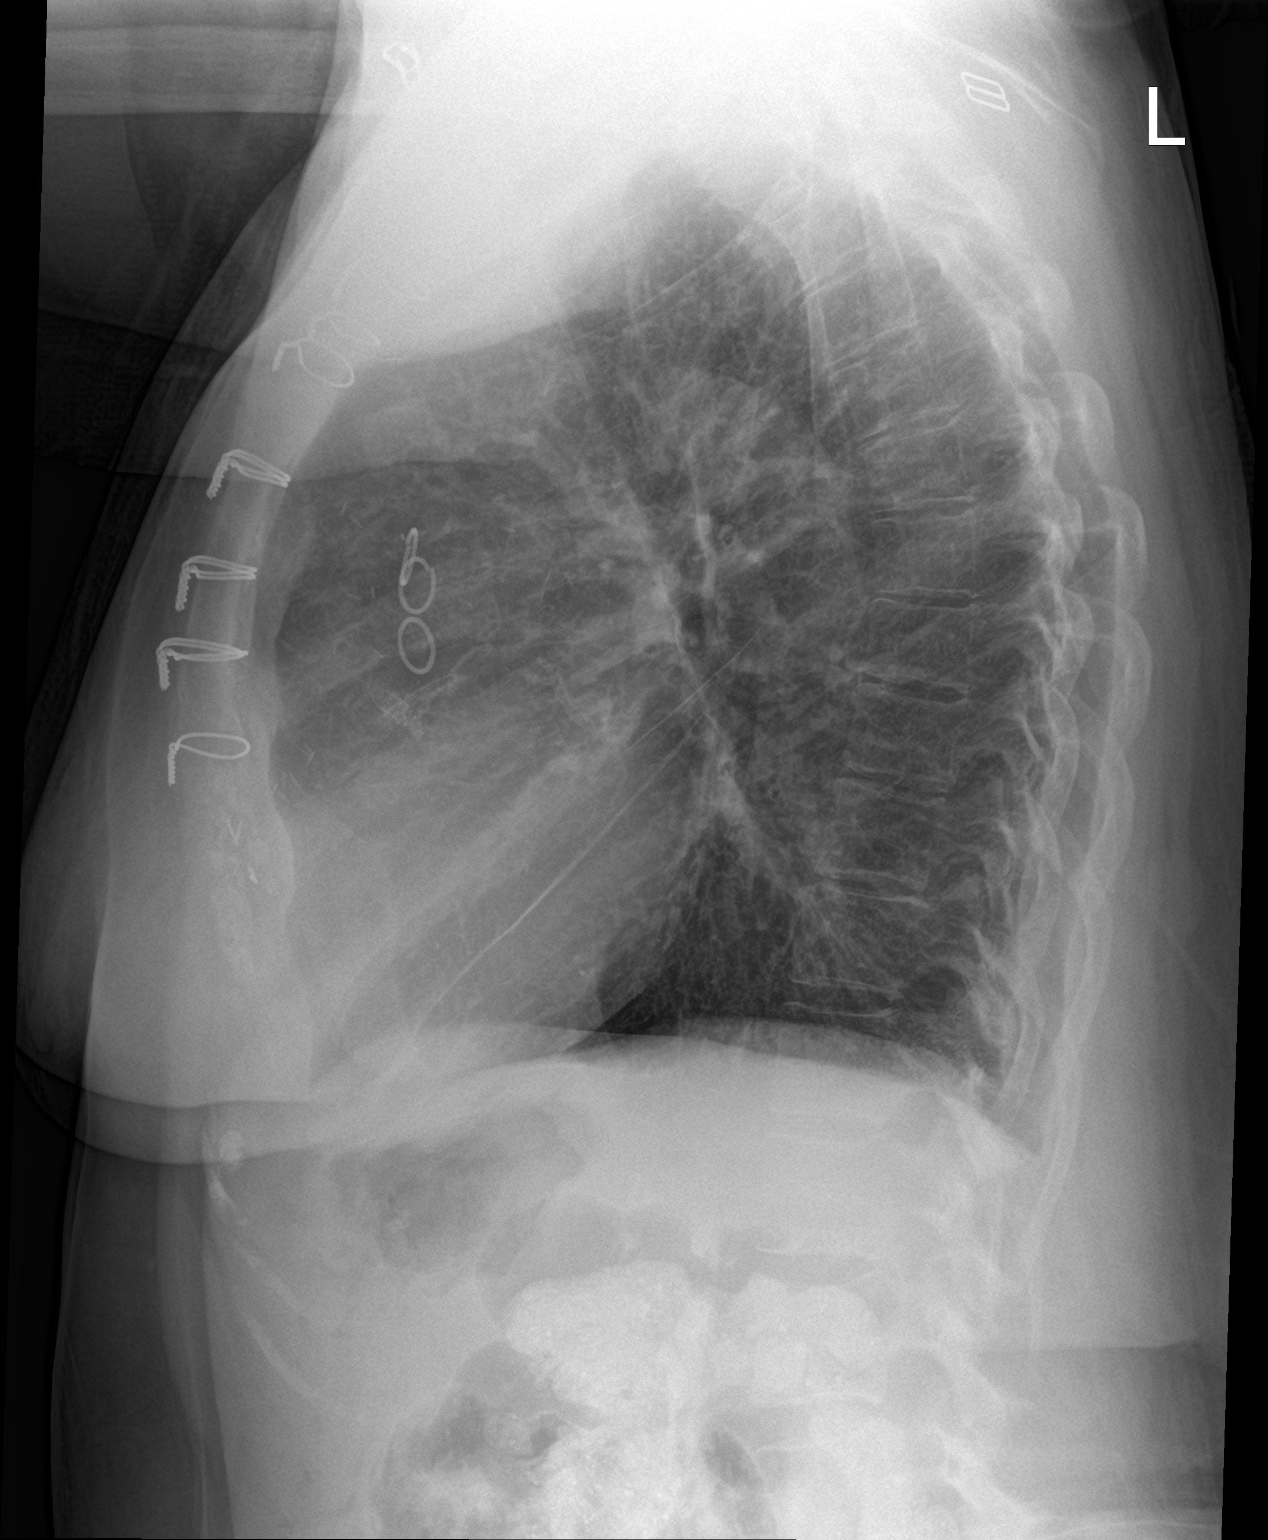

[2 of 2 positions shown; findings below may reference images not displayed]

FINDINGS: Status post median sternotomy and CABG. The heart is mildly
enlarged. There are no focal consolidations pleural effusions. No
pulmonary edema. Contrast is identified within nondilated loops of
colon.
IMPRESSION: 1. Mild cardiomegaly.
2.  No evidence for acute cardiopulmonary abnormality.
3. Retained contrast.

## 2015-08-30 MED ORDER — ALBUTEROL SULFATE (2.5 MG/3ML) 0.083% IN NEBU: 2.5000 mg | INHALATION_SOLUTION | RESPIRATORY_TRACT | Status: DC | PRN

## 2015-08-30 MED ORDER — CINACALCET HCL 30 MG PO TABS
30.0000 mg | ORAL_TABLET | Freq: Every day | ORAL | Status: DC
  Administered 2015-09-02: 30 mg via ORAL
  Filled 2015-08-30 (×6): qty 1

## 2015-08-30 MED ORDER — SODIUM CHLORIDE 0.9% FLUSH
3.0000 mL | Freq: Two times a day (BID) | INTRAVENOUS | Status: DC
  Administered 2015-09-01 – 2015-09-03 (×3): 3 mL via INTRAVENOUS

## 2015-08-30 MED ORDER — SODIUM CHLORIDE 0.9 % IV SOLN: 250.0000 mL | INTRAVENOUS | Status: DC | PRN

## 2015-08-30 MED ORDER — HYDRALAZINE HCL 25 MG PO TABS
100.0000 mg | ORAL_TABLET | Freq: Three times a day (TID) | ORAL | Status: DC
  Administered 2015-08-30: 75 mg via ORAL
  Administered 2015-08-30 – 2015-08-31 (×2): 100 mg via ORAL
  Filled 2015-08-30 (×3): qty 4

## 2015-08-30 MED ORDER — ONDANSETRON HCL 4 MG/2ML IJ SOLN: 4.0000 mg | Freq: Three times a day (TID) | INTRAMUSCULAR | Status: DC | PRN

## 2015-08-30 MED ORDER — FLUTICASONE PROPIONATE 50 MCG/ACT NA SUSP
2.0000 | Freq: Every day | NASAL | Status: DC
  Administered 2015-08-30 – 2015-09-03 (×5): 2 via NASAL
  Filled 2015-08-30 (×3): qty 16

## 2015-08-30 MED ORDER — LABETALOL HCL 200 MG PO TABS
300.0000 mg | ORAL_TABLET | Freq: Two times a day (BID) | ORAL | Status: DC
  Administered 2015-08-30: 200 mg via ORAL
  Administered 2015-08-31 – 2015-09-03 (×4): 300 mg via ORAL
  Filled 2015-08-30 (×7): qty 2

## 2015-08-30 MED ORDER — LORATADINE 10 MG PO TABS
10.0000 mg | ORAL_TABLET | Freq: Every day | ORAL | Status: DC
  Administered 2015-08-31 – 2015-09-03 (×4): 10 mg via ORAL
  Filled 2015-08-30 (×4): qty 1

## 2015-08-30 MED ORDER — OSELTAMIVIR PHOSPHATE 75 MG PO CAPS: 75.0000 mg | ORAL_CAPSULE | Freq: Two times a day (BID) | ORAL | Status: DC

## 2015-08-30 MED ORDER — ASPIRIN EC 81 MG PO TBEC
81.0000 mg | DELAYED_RELEASE_TABLET | Freq: Every day | ORAL | Status: DC
  Administered 2015-08-31 – 2015-09-03 (×4): 81 mg via ORAL
  Filled 2015-08-30 (×4): qty 1

## 2015-08-30 MED ORDER — BENZONATATE 100 MG PO CAPS: 100.0000 mg | ORAL_CAPSULE | Freq: Three times a day (TID) | ORAL | Status: DC

## 2015-08-30 MED ORDER — SIMVASTATIN 20 MG PO TABS
20.0000 mg | ORAL_TABLET | Freq: Every day | ORAL | Status: DC
  Administered 2015-08-30 – 2015-09-02 (×4): 20 mg via ORAL
  Filled 2015-08-30 (×4): qty 1

## 2015-08-30 MED ORDER — ALBUTEROL SULFATE (2.5 MG/3ML) 0.083% IN NEBU
2.5000 mg | INHALATION_SOLUTION | RESPIRATORY_TRACT | Status: DC
  Administered 2015-08-30: 2.5 mg via RESPIRATORY_TRACT
  Filled 2015-08-30: qty 3

## 2015-08-30 MED ORDER — ISOSORBIDE DINITRATE 20 MG PO TABS
30.0000 mg | ORAL_TABLET | Freq: Three times a day (TID) | ORAL | Status: DC
  Administered 2015-08-30: 30 mg via ORAL
  Administered 2015-08-30: 20 mg via ORAL
  Administered 2015-08-31 – 2015-09-03 (×5): 30 mg via ORAL
  Filled 2015-08-30: qty 2
  Filled 2015-08-30: qty 1
  Filled 2015-08-30: qty 2
  Filled 2015-08-30: qty 1
  Filled 2015-08-30 (×2): qty 2
  Filled 2015-08-30: qty 1
  Filled 2015-08-30: qty 2
  Filled 2015-08-30: qty 1

## 2015-08-30 MED ORDER — LANTHANUM CARBONATE 500 MG PO CHEW
1000.0000 mg | CHEWABLE_TABLET | Freq: Three times a day (TID) | ORAL | Status: DC
  Filled 2015-08-30 (×9): qty 2

## 2015-08-30 MED ORDER — POLYETHYLENE GLYCOL 3350 17 G PO PACK: 17.0000 g | PACK | Freq: Every day | ORAL | Status: DC | PRN

## 2015-08-30 MED ORDER — ACETAMINOPHEN 325 MG PO TABS: 650.0000 mg | ORAL_TABLET | Freq: Four times a day (QID) | ORAL | Status: DC | PRN

## 2015-08-30 MED ORDER — OSELTAMIVIR PHOSPHATE 75 MG PO CAPS
75.0000 mg | ORAL_CAPSULE | Freq: Once | ORAL | Status: AC
  Administered 2015-08-30: 75 mg via ORAL
  Filled 2015-08-30: qty 1

## 2015-08-30 MED ORDER — ALBUTEROL (5 MG/ML) CONTINUOUS INHALATION SOLN
10.0000 mg/h | INHALATION_SOLUTION | RESPIRATORY_TRACT | Status: DC
  Administered 2015-08-30: 10 mg/h via RESPIRATORY_TRACT
  Filled 2015-08-30: qty 20

## 2015-08-30 MED ORDER — ACETAMINOPHEN 325 MG PO TABS
650.0000 mg | ORAL_TABLET | Freq: Four times a day (QID) | ORAL | Status: DC | PRN
  Administered 2015-08-30 – 2015-08-31 (×3): 650 mg via ORAL
  Filled 2015-08-30 (×3): qty 2

## 2015-08-30 MED ORDER — ALBUTEROL SULFATE (2.5 MG/3ML) 0.083% IN NEBU
2.5000 mg | INHALATION_SOLUTION | Freq: Four times a day (QID) | RESPIRATORY_TRACT | Status: DC
  Administered 2015-08-31 – 2015-09-01 (×4): 2.5 mg via RESPIRATORY_TRACT
  Filled 2015-08-30 (×3): qty 3

## 2015-08-30 MED ORDER — ONDANSETRON 4 MG PO TBDP
4.0000 mg | ORAL_TABLET | Freq: Three times a day (TID) | ORAL | Status: DC | PRN
Start: 2015-08-30 — End: 2015-09-03
  Filled 2015-08-30: qty 1

## 2015-08-30 MED ORDER — NAPHAZOLINE-GLYCERIN 0.012-0.2 % OP SOLN
1.0000 [drp] | Freq: Four times a day (QID) | OPHTHALMIC | Status: DC | PRN
  Filled 2015-08-30: qty 15

## 2015-08-30 MED ORDER — ACETAMINOPHEN 650 MG RE SUPP
650.0000 mg | Freq: Four times a day (QID) | RECTAL | Status: DC | PRN
  Administered 2015-08-31: 650 mg via RECTAL
  Filled 2015-08-30: qty 1

## 2015-08-30 MED ORDER — OSELTAMIVIR PHOSPHATE 30 MG PO CAPS
30.0000 mg | ORAL_CAPSULE | ORAL | Status: DC
  Administered 2015-08-31 – 2015-09-02 (×2): 30 mg via ORAL
  Filled 2015-08-30 (×4): qty 1

## 2015-08-30 MED ORDER — SODIUM CHLORIDE 0.9% FLUSH: 3.0000 mL | INTRAVENOUS | Status: DC | PRN

## 2015-08-30 MED ORDER — CLONIDINE HCL 0.2 MG PO TABS
0.3000 mg | ORAL_TABLET | Freq: Two times a day (BID) | ORAL | Status: DC
  Administered 2015-08-31 – 2015-09-03 (×2): 0.3 mg via ORAL
  Filled 2015-08-30 (×7): qty 1

## 2015-08-30 MED ORDER — NITROGLYCERIN 0.4 MG SL SUBL: 0.4000 mg | SUBLINGUAL_TABLET | SUBLINGUAL | Status: DC | PRN

## 2015-08-30 MED ORDER — ACETAMINOPHEN 500 MG PO TABS
1000.0000 mg | ORAL_TABLET | Freq: Once | ORAL | Status: AC
  Administered 2015-08-30: 1000 mg via ORAL
  Filled 2015-08-30: qty 2

## 2015-08-30 MED ORDER — ALPRAZOLAM 0.5 MG PO TABS: 0.5000 mg | ORAL_TABLET | Freq: Three times a day (TID) | ORAL | Status: DC | PRN

## 2015-08-30 NOTE — Progress Notes (Signed)
Attempted to obtain IV venous access w/o success to RIGHT FA region. Patient is difficult stick with limited access d/t LEFT arm restriction d/t dialysis fistula. Patient refused to be "stuck again tonight" she stated. No current IVF or IV medications ordered at this time.

## 2015-08-30 NOTE — ED Notes (Signed)
MD notified lactic acid 2.01

## 2015-08-30 NOTE — Discharge Instructions (Signed)
Please obtain all of your results from medical records or have your doctors office obtain the results - share them with your doctor - you should be seen at your doctors office in the next 2 days. Call today to arrange your follow up. Take the medications as prescribed. Please review all of the medicines and only take them if you do not have an allergy to them. Please be aware that if you are taking birth control pills, taking other prescriptions, ESPECIALLY ANTIBIOTICS may make the birth control ineffective - if this is the case, either do not engage in sexual activity or use alternative methods of birth control such as condoms until you have finished the medicine and your family doctor says it is OK to restart them. If you are on a blood thinner such as COUMADIN, be aware that any other medicine that you take may cause the coumadin to either work too much, or not enough - you should have your coumadin level rechecked in next 7 days if this is the case.  °?  °It is also a possibility that you have an allergic reaction to any of the medicines that you have been prescribed - Everybody reacts differently to medications and while MOST people have no trouble with most medicines, you may have a reaction such as nausea, vomiting, rash, swelling, shortness of breath. If this is the case, please stop taking the medicine immediately and contact your physician.  °?  °You should return to the ER if you develop severe or worsening symptoms.  ° ° °

## 2015-08-30 NOTE — ED Notes (Signed)
Unable to obtain iv access after multiple sticks by multiple nurses, floor nurse informed.

## 2015-08-30 NOTE — ED Notes (Addendum)
Patient c/o cough with fevers, body aches, chills, and nausea. Per patient took 2 tylenol this morning at 6am. Denies any vomiting or diarrhea. Patient reports felling fatigued. Per patient has hx of pneumonia.

## 2015-08-30 NOTE — Progress Notes (Signed)
Pt. Arrived to unit. Notified Dr. Sarajane Jews.  Report received from [facility] ED, RN.  Unable to establish IV in ED.

## 2015-08-30 NOTE — ED Notes (Signed)
Ambulated Pt her spo2 was 97 & pulse was 84. Walked ok also

## 2015-08-30 NOTE — H&P (Signed)
History and Physical  Shawna [name] [id number] DOB: [birth year] DOA: 08/30/2015  Referring physician: Dr. Noemi Chapel in ED PCP: [name], MD   Chief Complaint: SOB  HPI:  [id number] past medical history end-stage renal disease on hemodialysis Monday Wednesday Friday, presented to the emergency department with acute onset of cough, chills, fever and shortness of breath within the last 24 hours. Initial evaluation revealed acute influenza A and plans were made for observation.  Patient was recently treated for sinusitis with Keflex and nearly completed this prescription. Last evening she developed significant coughing to the point where she difficulty sleeping. She developed some shortness of breath with this as well, fever and chills. She had some nausea and abdominal pain this morning. Abdominal pain is resolved. She does have some chest pain earlier today, resolved. [name] this morning, no specific aggravating or alleviating factors. She did get influenza vaccine this year. [name], last session 2/17. She reports them and left to much fluid. Her dry weight is 61.2. However when she left the facility her weight was 62.  [name], hypertensive, tachycardic, no hypoxia Pertinent labs: BMP consistent with end-stage renal disease. Hepatic function panel unremarkable. Hemoglobin 8.7. Lactic acid 2.01. Influenza A positive. Imaging: CXR independently reviewed: NAD  Review of Systems:  Positive for fever, visual changes, muscle aches and shoulders, chest pain earlier today (resolved), shortness of breath, nausea, abdominal pain earlier today Negative for sore throat, rash,  dysuria, bleeding, vomiting   Past Medical History  Diagnosis Date  . Chronic bronchitis ([place])   . GERD (gastroesophageal reflux disease)   . PUD (peptic ulcer disease)   . History of lower GI bleeding   . Arthritis   . History of gout   . CAD  (coronary artery disease)     a. 12/2011 NSTEMI/Cath/PCI LCX (2.25x14 Resolute DES) & D1 (2.25x22 Resolute DES);  b. 01/2012 Cath/PCI: LM 30, LAD 30p, 40-79m, D1 stent ok, 99 in sm branch of diag, LCX patent stent, OM1 20, RCA 95 ost (4.0x12 Promus DES), EF 55%;  c. 04/2012 Lexi Cardiolite  EF 48%, small area of scar @ base/mid inflat wall with mild peri-infarct ischemia.; CABG 12/4  . High cholesterol 12/2011  . Pneumonia ~ 2009  . Iron deficiency anemia   . TIA (transient ischemic attack)   . Anxiety   . History of blood transfusion 07/2011; 12/2011; 01/2012 X 2; 04/2012  . Carotid artery disease ([place])     a. [id number] LICA, [id number]   . Mitral regurgitation     a. Moderate by echo, 02/2012  . Myocardial infarction ([place])   . Chronic diastolic CHF (congestive heart failure) ([place])     a. 02/2012 Echo EF 60-65%, nl wall motion, Gr 1 DD, mod MR  . Hypertension   . AVM (arteriovenous malformation) of colon   . Esophageal stricture   . Ovarian cancer ([place]) 1992  . Colon cancer ([place]) 1992  . ESRD on hemodialysis ([place])     ESRD due to HTN, started dialysis 2011 and gets HD at [facility] with Dr Hinda Lenis on MWF schedule.  Access is LUA AVF as of Sept 2014.     Past Surgical History  Procedure Laterality Date  . Abdominal hysterectomy  1992  . Appendectomy  06/1990  . Tubal ligation  1980's  . Av fistula placement  07/2009    left upper arm  . Thrombectomy / arteriovenous graft revision  2011  left upper arm  . Colon resection  1992  . Esophagogastroduodenoscopy  01/20/2012    Procedure: ESOPHAGOGASTRODUODENOSCOPY (EGD);  Surgeon: Ladene Artist, MD,FACG;  Location: [facility] ENDOSCOPY;  Service: Endoscopy;  Laterality: N/A;  . Dilation and curettage of uterus    . Coronary angioplasty with stent placement  12/15/11    "2"  . Coronary angioplasty with stent placement  y/2013    "1; makes total of 3" (05/02/2012)  . Coronary artery bypass graft  06/13/2012    Procedure: CORONARY ARTERY BYPASS GRAFTING  (CABG);  Surgeon: Grace Isaac, MD;  Location: [place];  Service: Open Heart Surgery;  Laterality: N/A;  cabg x four;  using left internal mammary artery, and left leg greater saphenous vein harvested endoscopically  . Intraoperative transesophageal echocardiogram  06/13/2012    Procedure: INTRAOPERATIVE TRANSESOPHAGEAL ECHOCARDIOGRAM;  Surgeon: Grace Isaac, MD;  Location: [place];  Service: Open Heart Surgery;  Laterality: N/A;  . Esophagogastroduodenoscopy N/A 03/26/2013    Procedure: ESOPHAGOGASTRODUODENOSCOPY (EGD);  Surgeon: Irene Shipper, MD;  Location: [facility] ENDOSCOPY;  Service: Endoscopy;  Laterality: N/A;  . Ovary surgery      ovarian cancer  . Cardiac surgery    . Left heart catheterization with coronary angiogram N/A 12/15/2011    Procedure: LEFT HEART CATHETERIZATION WITH CORONARY ANGIOGRAM;  Surgeon: Burnell Blanks, MD;  Location: [facility] CATH LAB;  Service: Cardiovascular;  Laterality: N/A;  . Left heart catheterization with coronary angiogram N/A 01/10/2012    Procedure: LEFT HEART CATHETERIZATION WITH CORONARY ANGIOGRAM;  Surgeon: Peter M Martinique, MD;  Location: [facility] CATH LAB;  Service: Cardiovascular;  Laterality: N/A;  . Left heart catheterization with coronary angiogram N/A 06/08/2012    Procedure: LEFT HEART CATHETERIZATION WITH CORONARY ANGIOGRAM;  Surgeon: Burnell Blanks, MD;  Location: [facility] CATH LAB;  Service: Cardiovascular;  Laterality: N/A;  . Shuntogram N/A 10/15/2013    Procedure: Fistulogram;  Surgeon: Serafina Mitchell, MD;  Location: [facility] CATH LAB;  Service: Cardiovascular;  Laterality: N/A;  . Left heart catheterization with coronary/graft angiogram N/A 12/10/2013    Procedure: LEFT HEART CATHETERIZATION WITH Beatrix Fetters;  Surgeon: [name], MD;  Location: [facility] CATH LAB;  Service: Cardiovascular;  Laterality: N/A;  . Esophagogastroduodenoscopy N/A 04/30/2015    Procedure: ESOPHAGOGASTRODUODENOSCOPY (EGD);  Surgeon: Rogene Houston, MD;  Location: [facility]  ENDO SUITE;  Service: Endoscopy;  Laterality: N/A;  1pm - moved to 10/20 @ 1:10    Social History:  reports that she has never smoked. She has never used smokeless tobacco. She reports that she does not drink alcohol or use illicit drugs. Lives with husband Self-[facility]  Allergies  Allergen Reactions  . Aspirin Other (See Comments)    Mess up her stomach; "makes my bowels have blood in them". Takes 81 mg EC Aspirin   . Contrast Media [Iodinated Diagnostic Agents] Itching  . Iron Itching and Other (See Comments)    "they gave me iron in dialysis; had to give me Benadryl cause I had to have the iron" (05/02/2012)  . Macrodantin [Nitrofurantoin Macrocrystal] Other (See Comments)    "broke me out in big old knots all over my body; had to go to ER"  . Penicillins Other (See Comments)    "makes me real weak when I take it; like I'll pass out"  . Plavix [Clopidogrel Bisulfate] Rash  . Amlodipine Swelling    Leg swelling  . Bactrim [Sulfamethoxazole-Trimethoprim] Rash  . Dexilant [Dexlansoprazole] Other (See Comments)    Upset  stomach  . Morphine And Related Itching    Itching in feet  . Nitrofurantoin Hives  . Prilosec [Omeprazole] Other (See Comments)    "back spasms"  . Sulfa Antibiotics Rash  . Venofer [Ferric Oxide] Itching    Patient reports using Benadryl prior to doses as [facility]  . Levaquin [Levofloxacin In D5w] Rash  . Protonix [Pantoprazole Sodium] Rash    Family History  Problem Relation Age of Onset  . Other      noncontributory for early CAD  . Heart disease Mother     Heart Disease before age 72  . Hyperlipidemia Mother   . Hypertension Mother   . Diabetes Mother   . Heart attack Mother   . Heart disease Father     Heart Disease before age 31  . Hyperlipidemia Father   . Hypertension Father   . Diabetes Father   . Diabetes Sister   . Hypertension Sister   . Diabetes Brother   . Hyperlipidemia Brother   . Heart attack Brother   . Colon cancer Neg Hx    . Esophageal cancer Neg Hx   . Liver disease Neg Hx   . Kidney disease Neg Hx   . Colon polyps Neg Hx   . Hypertension Sister   . Heart attack Brother      Prior to Admission medications   Medication Sig Start Date End Date Taking? Authorizing Provider  acetaminophen (TYLENOL) 325 MG tablet Take 2 tablets (650 mg total) by mouth every 6 (six) hours as needed for mild pain (mild pain). 10/02/14  Yes Estela Leonie Green, MD  ALPRAZolam Duanne Moron) 0.5 MG tablet Take 1 tablet (0.5 mg total) by mouth 3 (three) times daily. 01/27/15  Yes Theodis Blaze, MD  amLODipine (NORVASC) 2.5 MG tablet Take 1 tablet (2.5 mg total) by mouth daily. 07/28/15  Yes Herminio Commons, MD  aspirin EC 81 MG tablet Take 81 mg by mouth daily.    Yes Historical Provider, MD  cephALEXin (KEFLEX) 500 MG capsule Take 1 capsule by mouth daily. For 10 days 08/22/15  Yes Historical Provider, MD  cloNIDine (CATAPRES) 0.3 MG tablet Take 1 tablet (0.3 mg total) by mouth 2 (two) times daily. 06/10/15  Yes Herminio Commons, MD  fluticasone (FLONASE) 50 MCG/ACT nasal spray Place 2 sprays into the nose daily.    Yes Historical Provider, MD  folic acid-vitamin b complex-vitamin c-selenium-zinc (DIALYVITE) 3 MG TABS Take 1 tablet by mouth daily.   Yes Historical Provider, MD  hydrALAZINE (APRESOLINE) 50 MG tablet Take 2 tablets (100 mg total) by mouth 3 (three) times daily. 06/10/15  Yes Herminio Commons, MD  isosorbide dinitrate (ISORDIL) 30 MG tablet Take 1 tablet (30 mg total) by mouth 3 (three) times daily. 07/28/15  Yes Herminio Commons, MD  labetalol (NORMODYNE) 200 MG tablet Take 1.5 tablets (300 mg total) by mouth 2 (two) times daily. 06/17/15  Yes Herminio Commons, MD  lanthanum (FOSRENOL) 1000 MG chewable tablet Chew 1,000 mg by mouth 3 (three) times daily after meals.   Yes Historical Provider, MD  loratadine (CLARITIN) 10 MG tablet Take 10 mg by mouth daily.    Yes Historical Provider, MD  nitroGLYCERIN  (NITROSTAT) 0.4 MG SL tablet Place 1 tablet (0.4 mg total) under the tongue every 5 (five) minutes x 3 doses as needed for chest pain. 06/23/14  Yes Herminio Commons, MD  omeprazole (PRILOSEC) 20 MG capsule Take 1 capsule (20 mg  total) by mouth daily. 05/12/15  Yes Butch Penny, NP  ondansetron (ZOFRAN-ODT) 4 MG disintegrating tablet Take 4 mg by mouth every 8 (eight) hours as needed. nausea 05/18/15  Yes Historical Provider, MD  polyethylene glycol (MIRALAX / GLYCOLAX) packet Take 17 g by mouth daily. Patient taking differently: Take 17 g by mouth daily as needed for mild constipation or moderate constipation.  10/02/14  Yes Estela Leonie Green, MD  PROAIR HFA 108 [phone] BASE) MCG/ACT inhaler Inhale 1 puff into the lungs every 6 (six) hours as needed for wheezing or shortness of breath.  01/09/15  Yes Historical Provider, MD  SENSIPAR 30 MG tablet Take 30 mg by mouth daily with supper.  12/21/12  Yes Historical Provider, MD  simvastatin (ZOCOR) 20 MG tablet TAKE ONE TABLET BY MOUTH AT BEDTIME 07/22/15  Yes Herminio Commons, MD  tetrahydrozoline 0.05 % ophthalmic solution Place 1 drop into both eyes 2 (two) times daily as needed (irritation).   Yes Historical Provider, MD  benzonatate (TESSALON) 100 MG capsule Take 1 capsule (100 mg total) by mouth every 8 (eight) hours. 08/30/15   Noemi Chapel, MD  oseltamivir (TAMIFLU) 75 MG capsule Take 1 capsule (75 mg total) by mouth every 12 (twelve) hours. 08/30/15   Noemi Chapel, MD   Physical Exam: Filed Vitals:   08/30/15 1400 08/30/15 1422 08/30/15 1501 08/30/15 1520  BP: 162/81  190/81 188/78  Pulse:  108 114 116  Temp:      TempSrc:      Resp:   16 22  Height:      Weight:      SpO2:  94% 99% 100%     General:  Appears calm and comfortable Eyes: PERRL, normal lids, irises  ENT: grossly normal hearing, lips  Neck: appears grossly normal Cardiovascular: RRR, no m/r/g. No LE edema. Respiratory: CTA bilaterally, no w/r/r. Normal respiratory  effort. Abdomen: soft, ntnd Skin: no rash or induration noted Musculoskeletal: grossly normal tone BUE/BLE Psychiatric: grossly normal mood and affect, speech fluent and appropriate Neurologic: grossly non-focal.  Wt Readings from Last 3 Encounters:  08/30/15 62.143 kg (137 lb)  08/20/15 62.596 kg (138 lb)  08/13/15 63.821 kg (140 lb 11.2 oz)    Labs on Admission:  Basic Metabolic Panel:  Recent Labs Lab 08/30/15 1025  NA 143  K 5.1  CL 101  CO2 25  GLUCOSE 107*  BUN 53*  CREATININE 10.69*  CALCIUM 8.8*    Liver Function Tests:  Recent Labs Lab 08/30/15 1025  AST 26  ALT 17  ALKPHOS 130*  BILITOT 0.4  PROT 6.9  ALBUMIN 4.0    CBC:  Recent Labs Lab 08/30/15 1025  WBC 11.0*  NEUTROABS 9.4*  HGB 8.7*  HCT 26.9*  MCV 101.1*  PLT 217   Radiological Exams on Admission: Dg Chest 2 View  08/30/2015  CLINICAL DATA:  Insert epic study EXAM: CHEST  2 VIEW COMPARISON:  06/07/2015 FINDINGS: Status post median sternotomy and CABG. The heart is mildly enlarged. There are no focal consolidations pleural effusions. No pulmonary edema. Contrast is identified within nondilated loops of colon. IMPRESSION: 1. Mild cardiomegaly. 2.  No evidence for acute cardiopulmonary abnormality. 3. Retained contrast. Electronically Signed   By: Nolon Nations M.D.   On: 08/30/2015 10:19      Principal Problem:   Influenza A Active Problems:   ESRD on hemodialysis (HCC)   Anemia of chronic kidney failure   Assessment/Plan 1. Influenza A with fever, SOB.  No hypoxia, lactic acid 2.01. No evidence of sepsis. 2. ESRD on hemodialysis Monday was a Friday. 3. Anemia of CKD, stable 4. PMH chronic bronchitis   obs to medical bed  Tamiflu, supportive care, monitor for deterioration.  Consult nephrology in AM for routine HD  Code Status: full code  DVT prophylaxis: SCDs Family Communication: husband at bedside Disposition Plan/Anticipated LOS: obs, 1-2 days  Time spent: 9  minutes  Murray Hodgkins, MD  [facility] Pager [phone] 08/30/2015, 3:39 PM

## 2015-08-30 NOTE — ED Notes (Signed)
Pt made aware to return if symptoms worsen or if any life threatening symptoms occur.  Dr. Sabra Heck made aware that pt was requesting dialysis, pt told to return for dialysis tomorrow.

## 2015-08-30 NOTE — ED Provider Notes (Addendum)
CSN: [id number]     Arrival date & time 08/30/15  0901 History  By signing my name below, I, Jolayne Panther, attest that this documentation has been prepared under the direction and in the presence of Noemi Chapel, MD. Electronically Signed: Jolayne Panther, Scribe. 08/30/2015. 10:42 AM.   Chief Complaint  Patient presents with  . Cough   The history is provided by the patient. No language interpreter was used.  HPI Comments: LATRESIA BODMAN is a 75 y.o. female with a hx of PNA, who presents to the Emergency Department complaining of sudden onset, constant, mild cough which began last night. Pt also reports associated fever measured at 102 in the ED, chills, chest tightness, neck pain, body aches, fatigue, and nausea. She also notes severe abdominal pain this morning which has since resolved. Pt took 2 tylenol four hours ago with no relief. She also reports that she received dialysis two days ago and notes that she received a flu shot this year. Pt was able to take and keep down her medications this morning. She denies vomiting, diarrhea, and back pain.   Past Medical History  Diagnosis Date  . Chronic bronchitis ([place])   . GERD (gastroesophageal reflux disease)   . PUD (peptic ulcer disease)   . History of lower GI bleeding   . Arthritis   . History of gout   . CAD (coronary artery disease)     a. 12/2011 NSTEMI/Cath/PCI LCX (2.25x14 Resolute DES) & D1 (2.25x22 Resolute DES);  b. 01/2012 Cath/PCI: LM 30, LAD 30p, 40-72m, D1 stent ok, 99 in sm branch of diag, LCX patent stent, OM1 20, RCA 95 ost (4.0x12 Promus DES), EF 55%;  c. 04/2012 Lexi Cardiolite  EF 48%, small area of scar @ base/mid inflat wall with mild peri-infarct ischemia.; CABG 12/4  . High cholesterol 12/2011  . Pneumonia ~ 2009  . Iron deficiency anemia   . TIA (transient ischemic attack)   . Anxiety   . History of blood transfusion 07/2011; 12/2011; 01/2012 X 2; 04/2012  . Carotid artery disease ([place])     a. [id number] LICA,  [id number]   . Mitral regurgitation     a. Moderate by echo, 02/2012  . Myocardial infarction ([place])   . Chronic diastolic CHF (congestive heart failure) ([place])     a. 02/2012 Echo EF 60-65%, nl wall motion, Gr 1 DD, mod MR  . Hypertension   . AVM (arteriovenous malformation) of colon   . Esophageal stricture   . Ovarian cancer ([place]) 1992  . Colon cancer ([place]) 1992  . ESRD on hemodialysis ([place])     ESRD due to HTN, started dialysis 2011 and gets HD at [facility] with Dr Hinda Lenis on MWF schedule.  Access is LUA AVF as of Sept 2014.    Past Surgical History  Procedure Laterality Date  . Abdominal hysterectomy  1992  . Appendectomy  06/1990  . Tubal ligation  1980's  . Av fistula placement  07/2009    left upper arm  . Thrombectomy / arteriovenous graft revision  2011    left upper arm  . Colon resection  1992  . Esophagogastroduodenoscopy  01/20/2012    Procedure: ESOPHAGOGASTRODUODENOSCOPY (EGD);  Surgeon: Ladene Artist, MD,FACG;  Location: [facility] ENDOSCOPY;  Service: Endoscopy;  Laterality: N/A;  . Dilation and curettage of uterus    . Coronary angioplasty with stent placement  12/15/11    "2"  . Coronary angioplasty with stent placement  y/2013    "  1; makes total of 3" (05/02/2012)  . Coronary artery bypass graft  06/13/2012    Procedure: CORONARY ARTERY BYPASS GRAFTING (CABG);  Surgeon: Grace Isaac, MD;  Location: [place];  Service: Open Heart Surgery;  Laterality: N/A;  cabg x four;  using left internal mammary artery, and left leg greater saphenous vein harvested endoscopically  . Intraoperative transesophageal echocardiogram  06/13/2012    Procedure: INTRAOPERATIVE TRANSESOPHAGEAL ECHOCARDIOGRAM;  Surgeon: Grace Isaac, MD;  Location: [place];  Service: Open Heart Surgery;  Laterality: N/A;  . Esophagogastroduodenoscopy N/A 03/26/2013    Procedure: ESOPHAGOGASTRODUODENOSCOPY (EGD);  Surgeon: Irene Shipper, MD;  Location: [facility] ENDOSCOPY;  Service: Endoscopy;  Laterality: N/A;  . [place]  surgery      ovarian cancer  . Cardiac surgery    . Left heart catheterization with coronary angiogram N/A 12/15/2011    Procedure: LEFT HEART CATHETERIZATION WITH CORONARY ANGIOGRAM;  Surgeon: Burnell Blanks, MD;  Location: [facility] CATH LAB;  Service: Cardiovascular;  Laterality: N/A;  . Left heart catheterization with coronary angiogram N/A 01/10/2012    Procedure: LEFT HEART CATHETERIZATION WITH CORONARY ANGIOGRAM;  Surgeon: Peter M Martinique, MD;  Location: [facility] CATH LAB;  Service: Cardiovascular;  Laterality: N/A;  . Left heart catheterization with coronary angiogram N/A 06/08/2012    Procedure: LEFT HEART CATHETERIZATION WITH CORONARY ANGIOGRAM;  Surgeon: Burnell Blanks, MD;  Location: [facility] CATH LAB;  Service: Cardiovascular;  Laterality: N/A;  . Shuntogram N/A 10/15/2013    Procedure: Fistulogram;  Surgeon: Serafina Mitchell, MD;  Location: [facility] CATH LAB;  Service: Cardiovascular;  Laterality: N/A;  . Left heart catheterization with coronary/graft angiogram N/A 12/10/2013    Procedure: LEFT HEART CATHETERIZATION WITH Beatrix Fetters;  Surgeon: [name], MD;  Location: [facility] CATH LAB;  Service: Cardiovascular;  Laterality: N/A;  . Esophagogastroduodenoscopy N/A 04/30/2015    Procedure: ESOPHAGOGASTRODUODENOSCOPY (EGD);  Surgeon: Rogene Houston, MD;  Location: [facility] ENDO [facility];  Service: Endoscopy;  Laterality: N/A;  1pm - moved to 10/20 @ 1:10   Family History  Problem Relation Age of Onset  . Other      noncontributory for early CAD  . Heart disease Mother     Heart Disease before age 21  . Hyperlipidemia Mother   . Hypertension Mother   . Diabetes Mother   . Heart attack Mother   . Heart disease Father     Heart Disease before age 86  . Hyperlipidemia Father   . Hypertension Father   . Diabetes Father   . Diabetes Sister   . Hypertension Sister   . Diabetes Brother   . Hyperlipidemia Brother   . Heart attack Brother   . Colon cancer Neg Hx   . Esophageal cancer Neg  Hx   . Liver disease Neg Hx   . Kidney disease Neg Hx   . Colon polyps Neg Hx   . Hypertension Sister   . Heart attack Brother    Social History  Substance Use Topics  . Smoking status: Never Smoker   . Smokeless tobacco: Never Used  . Alcohol Use: No   OB History    Gravida Para Term Preterm AB TAB SAB Ectopic Multiple Living   2 2  2      2      Review of Systems  Constitutional: Positive for fever, chills and fatigue.  Respiratory: Positive for cough and chest tightness.   Gastrointestinal: Positive for nausea and abdominal pain. Negative for vomiting and diarrhea.  Musculoskeletal:  Positive for myalgias and neck pain. Negative for back pain.  All other systems reviewed and are negative.  Allergies  Aspirin; Contrast media; Iron; Macrodantin; Penicillins; Plavix; Amlodipine; Bactrim; Dexilant; Morphine and related; Nitrofurantoin; Prilosec; Sulfa antibiotics; Venofer; Levaquin; and Protonix  Home Medications   Prior to Admission medications   Medication Sig Start Date End Date Taking? Authorizing Provider  acetaminophen (TYLENOL) 325 MG tablet Take 2 tablets (650 mg total) by mouth every 6 (six) hours as needed for mild pain (mild pain). 10/02/14  Yes Estela Leonie Green, MD  ALPRAZolam Duanne Moron) 0.5 MG tablet Take 1 tablet (0.5 mg total) by mouth 3 (three) times daily. 01/27/15  Yes Theodis Blaze, MD  amLODipine (NORVASC) 2.5 MG tablet Take 1 tablet (2.5 mg total) by mouth daily. 07/28/15  Yes Herminio Commons, MD  aspirin EC 81 MG tablet Take 81 mg by mouth daily.    Yes Historical Provider, MD  cephALEXin (KEFLEX) 500 MG capsule Take 1 capsule by mouth daily. For 10 days 08/22/15  Yes Historical Provider, MD  cloNIDine (CATAPRES) 0.3 MG tablet Take 1 tablet (0.3 mg total) by mouth 2 (two) times daily. 06/10/15  Yes Herminio Commons, MD  fluticasone (FLONASE) 50 MCG/ACT nasal spray Place 2 sprays into the nose daily.    Yes Historical Provider, MD  folic acid-vitamin  b complex-vitamin c-selenium-zinc (DIALYVITE) 3 MG TABS Take 1 tablet by mouth daily.   Yes Historical Provider, MD  hydrALAZINE (APRESOLINE) 50 MG tablet Take 2 tablets (100 mg total) by mouth 3 (three) times daily. 06/10/15  Yes Herminio Commons, MD  isosorbide dinitrate (ISORDIL) 30 MG tablet Take 1 tablet (30 mg total) by mouth 3 (three) times daily. 07/28/15  Yes Herminio Commons, MD  labetalol (NORMODYNE) 200 MG tablet Take 1.5 tablets (300 mg total) by mouth 2 (two) times daily. 06/17/15  Yes Herminio Commons, MD  lanthanum (FOSRENOL) 1000 MG chewable tablet Chew 1,000 mg by mouth 3 (three) times daily after meals.   Yes Historical Provider, MD  loratadine (CLARITIN) 10 MG tablet Take 10 mg by mouth daily.    Yes Historical Provider, MD  nitroGLYCERIN (NITROSTAT) 0.4 MG SL tablet Place 1 tablet (0.4 mg total) under the tongue every 5 (five) minutes x 3 doses as needed for chest pain. 06/23/14  Yes Herminio Commons, MD  omeprazole (PRILOSEC) 20 MG capsule Take 1 capsule (20 mg total) by mouth daily. 05/12/15  Yes Butch Penny, NP  ondansetron (ZOFRAN-ODT) 4 MG disintegrating tablet Take 4 mg by mouth every 8 (eight) hours as needed. nausea 05/18/15  Yes Historical Provider, MD  polyethylene glycol (MIRALAX / GLYCOLAX) packet Take 17 g by mouth daily. Patient taking differently: Take 17 g by mouth daily as needed for mild constipation or moderate constipation.  10/02/14  Yes Estela Leonie Green, MD  PROAIR HFA 108 [phone] BASE) MCG/ACT inhaler Inhale 1 puff into the lungs every 6 (six) hours as needed for wheezing or shortness of breath.  01/09/15  Yes Historical Provider, MD  SENSIPAR 30 MG tablet Take 30 mg by mouth daily with supper.  12/21/12  Yes Historical Provider, MD  simvastatin (ZOCOR) 20 MG tablet TAKE ONE TABLET BY MOUTH AT BEDTIME 07/22/15  Yes Herminio Commons, MD  tetrahydrozoline 0.05 % ophthalmic solution Place 1 drop into both eyes 2 (two) times daily as needed  (irritation).   Yes Historical Provider, MD  benzonatate (TESSALON) 100 MG capsule Take 1 capsule (100  mg total) by mouth every 8 (eight) hours. 08/30/15   Noemi Chapel, MD  oseltamivir (TAMIFLU) 75 MG capsule Take 1 capsule (75 mg total) by mouth every 12 (twelve) hours. 08/30/15   Noemi Chapel, MD   BP 188/78 mmHg  Pulse 116  Temp(Src) 98.8 F (37.1 C) (Oral)  Resp 22  Ht 5\' 1"  (1.549 m)  Wt 137 lb (62.143 kg)  BMI 25.90 kg/m2  SpO2 100% Physical Exam  Constitutional: She appears well-developed and well-nourished. No distress.  HENT:  Head: Normocephalic and atraumatic.  Mouth/Throat: Oropharynx is clear and moist. No oropharyngeal exudate.  Eyes: Conjunctivae and EOM are normal. Pupils are equal, round, and reactive to light. Right eye exhibits no discharge. Left eye exhibits no discharge. No scleral icterus.  Neck: Normal range of motion. Neck supple. No JVD present. No thyromegaly present.  Cardiovascular: Normal rate, regular rhythm, normal heart sounds and intact distal pulses.  Exam reveals no gallop and no friction rub.   No murmur heard. Pulmonary/Chest: Effort normal and breath sounds normal. No respiratory distress. She has no wheezes. She has no rales.  Abdominal: Soft. Bowel sounds are normal. She exhibits no distension and no mass. There is no tenderness.  Musculoskeletal: Normal range of motion. She exhibits no edema or tenderness.  Lymphadenopathy:    She has no cervical adenopathy.  Neurological: She is alert. Coordination normal.  Skin: Skin is warm and dry. No rash noted. No erythema.  Psychiatric: She has a normal mood and affect. Her behavior is normal.  Nursing note and vitals reviewed.   ED Course  Procedures  DIAGNOSTIC STUDIES:    Oxygen Saturation is 94% on RA, adequate by my interpretation.   COORDINATION OF CARE:  10:15 AM Will review chest x ray and labs. Discussed treatment plan with pt at bedside and pt agreed to plan.   Labs Review Labs  Reviewed  INFLUENZA PANEL BY PCR (TYPE A & B, H1N1) - Abnormal; Notable for the following:    Influenza A By PCR POSITIVE (*)    All other components within normal limits  CBC WITH DIFFERENTIAL/PLATELET - Abnormal; Notable for the following:    WBC 11.0 (*)    RBC 2.66 (*)    Hemoglobin 8.7 (*)    HCT 26.9 (*)    MCV 101.1 (*)    RDW 16.3 (*)    Neutro Abs 9.4 (*)    Lymphs Abs 0.5 (*)    Eosinophils Absolute 0.8 (*)    All other components within normal limits  COMPREHENSIVE METABOLIC PANEL - Abnormal; Notable for the following:    Glucose, Bld 107 (*)    BUN 53 (*)    Creatinine, Ser 10.69 (*)    Calcium 8.8 (*)    Alkaline Phosphatase 130 (*)    GFR calc non Af Amer 3 (*)    GFR calc Af Amer 4 (*)    Anion gap 17 (*)    All other components within normal limits  I-STAT CG4 LACTIC ACID, ED - Abnormal; Notable for the following:    Lactic Acid, Venous 2.01 (*)    All other components within normal limits   Imaging Review Dg Chest 2 View  08/30/2015  CLINICAL DATA:  Insert epic study EXAM: CHEST  2 VIEW COMPARISON:  06/07/2015 FINDINGS: Status post median sternotomy and CABG. The heart is mildly enlarged. There are no focal consolidations pleural effusions. No pulmonary edema. Contrast is identified within nondilated loops of colon. IMPRESSION: 1. Mild cardiomegaly.  2.  No evidence for acute cardiopulmonary abnormality. 3. Retained contrast. Electronically Signed   By: Nolon Nations M.D.   On: 08/30/2015 10:19   I have personally reviewed and evaluated these images and lab results as part of my medical decision-making.  MDM   Final diagnoses:  Influenza  ESRD (end stage renal disease) ([place])    I personally performed the services described in this documentation, which was scribed in my presence. The recorded information has been reviewed and is accurate.   The patient has tested positive for influenza which is consistent with having a normal chest x-ray, having vital  signs which are consistent with fever, tachycardia with myalgias and respiratory symptoms. She has been informed of these results, she feels better after medication, she will be sent home with medications including Tamiflu and ibuprofen with instructions for close follow-up, she initially expressed her understanding to these discharge instructions however she had a persistent feeling of shortness of breath, she had a progressive mild hypoxia and on my final reevaluation not only was she consistently tachycardic to 110 she had a hypoxia of 90-92% and appeared mildly dyspneic. Lung sounds still were clear, she would likely be better served inpatient being treated for influenza as well as expediting dialysis tomorrow morning. I discussed with Dr. Sarajane Jews and I appreciate his timely evaluation and admission of the patient for admission  Filed Vitals:   08/30/15 1400 08/30/15 1422 08/30/15 1501 08/30/15 1520  BP: 162/81  190/81 188/78  Pulse:  108 114 116  Temp:      TempSrc:      Resp:   16 22  Height:      Weight:      SpO2:  94% 99% 100%      Noemi Chapel, MD 08/30/15 1438  Noemi Chapel, MD 08/30/15 1544

## 2015-08-30 NOTE — Progress Notes (Signed)
2130 Patient refused some BP medications and only would take certain doses of some of them. Last BP 195/75. Mid-level notified.

## 2015-08-31 ENCOUNTER — Encounter (HOSPITAL_COMMUNITY): Payer: Self-pay

## 2015-08-31 ENCOUNTER — Observation Stay (HOSPITAL_COMMUNITY): Payer: Medicare HMO

## 2015-08-31 DIAGNOSIS — R5383 Other fatigue: Secondary | ICD-10-CM | POA: Diagnosis not present

## 2015-08-31 DIAGNOSIS — D631 Anemia in chronic kidney disease: Secondary | ICD-10-CM | POA: Diagnosis not present

## 2015-08-31 DIAGNOSIS — N186 End stage renal disease: Secondary | ICD-10-CM | POA: Diagnosis not present

## 2015-08-31 DIAGNOSIS — N185 Chronic kidney disease, stage 5: Secondary | ICD-10-CM | POA: Diagnosis not present

## 2015-08-31 DIAGNOSIS — K222 Esophageal obstruction: Secondary | ICD-10-CM | POA: Diagnosis not present

## 2015-08-31 DIAGNOSIS — Z992 Dependence on renal dialysis: Secondary | ICD-10-CM | POA: Diagnosis not present

## 2015-08-31 DIAGNOSIS — I120 Hypertensive chronic kidney disease with stage 5 chronic kidney disease or end stage renal disease: Secondary | ICD-10-CM | POA: Diagnosis not present

## 2015-08-31 DIAGNOSIS — I10 Essential (primary) hypertension: Secondary | ICD-10-CM | POA: Diagnosis not present

## 2015-08-31 DIAGNOSIS — G9340 Encephalopathy, unspecified: Secondary | ICD-10-CM | POA: Diagnosis not present

## 2015-08-31 DIAGNOSIS — J111 Influenza due to unidentified influenza virus with other respiratory manifestations: Secondary | ICD-10-CM | POA: Diagnosis not present

## 2015-08-31 DIAGNOSIS — J101 Influenza due to other identified influenza virus with other respiratory manifestations: Secondary | ICD-10-CM | POA: Diagnosis not present

## 2015-08-31 LAB — CBC
HEMATOCRIT: 23.4 % — AB (ref 36.0–46.0)
HEMATOCRIT: 25.2 % — AB (ref 36.0–46.0)
Hemoglobin: 7.5 g/dL — ABNORMAL LOW (ref 12.0–15.0)
Hemoglobin: 8.2 g/dL — ABNORMAL LOW (ref 12.0–15.0)
MCH: 32.3 pg (ref 26.0–34.0)
MCH: 32.5 pg (ref 26.0–34.0)
MCHC: 32.1 g/dL (ref 30.0–36.0)
MCHC: 32.5 g/dL (ref 30.0–36.0)
MCV: 100.9 fL — AB (ref 78.0–100.0)
MCV: 100 fL (ref 78.0–100.0)
PLATELETS: 175 10*3/uL (ref 150–400)
PLATELETS: 189 10*3/uL (ref 150–400)
RBC: 2.32 MIL/uL — ABNORMAL LOW (ref 3.87–5.11)
RBC: 2.52 MIL/uL — ABNORMAL LOW (ref 3.87–5.11)
RDW: 16.8 % — AB (ref 11.5–15.5)
RDW: 16.2 % — AB (ref 11.5–15.5)
WBC: 9.3 10*3/uL (ref 4.0–10.5)
WBC: 8.9 10*3/uL (ref 4.0–10.5)

## 2015-08-31 LAB — BASIC METABOLIC PANEL
ANION GAP: 18 — AB (ref 5–15)
Anion gap: 16 — ABNORMAL HIGH (ref 5–15)
BUN: 73 mg/dL — AB (ref 6–20)
BUN: 30 mg/dL — AB (ref 6–20)
CALCIUM: 8.3 mg/dL — AB (ref 8.9–10.3)
CALCIUM: 8.8 mg/dL — AB (ref 8.9–10.3)
CO2: 22 mmol/L (ref 22–32)
CO2: 26 mmol/L (ref 22–32)
CREATININE: 13.71 mg/dL — AB (ref 0.44–1.00)
CREATININE: 6.71 mg/dL — AB (ref 0.44–1.00)
Chloride: 101 mmol/L (ref 101–111)
Chloride: 95 mmol/L — ABNORMAL LOW (ref 101–111)
GFR calc Af Amer: 3 mL/min — ABNORMAL LOW (ref >60)
GFR calc Af Amer: 6 mL/min — ABNORMAL LOW (ref >60)
GFR calc non Af Amer: 2 mL/min — ABNORMAL LOW (ref >60)
GFR calc non Af Amer: 5 mL/min — ABNORMAL LOW (ref >60)
GLUCOSE: 110 mg/dL — AB (ref 65–99)
Glucose, Bld: 85 mg/dL (ref 65–99)
Potassium: 5.8 mmol/L — ABNORMAL HIGH (ref 3.5–5.1)
Potassium: 4.6 mmol/L (ref 3.5–5.1)
SODIUM: 141 mmol/L (ref 135–145)
Sodium: 137 mmol/L (ref 135–145)

## 2015-08-31 LAB — BLOOD GAS, ARTERIAL
Acid-Base Excess: 2.6 mmol/L — ABNORMAL HIGH (ref 0.0–2.0)
BICARBONATE: 26.9 meq/L — AB (ref 20.0–24.0)
Drawn by: 23534
FIO2: 0.21
O2 CONTENT: 21 L/min
O2 Saturation: 97 %
PCO2 ART: 33.3 mmHg — AB (ref 35.0–45.0)
PO2 ART: 107 mmHg — AB (ref 80.0–100.0)
pH, Arterial: 7.499 — ABNORMAL HIGH (ref 7.350–7.450)

## 2015-08-31 LAB — LACTIC ACID, PLASMA
Lactic Acid, Venous: 1.2 mmol/L (ref 0.5–2.0)
Lactic Acid, Venous: 1.4 mmol/L (ref 0.5–2.0)

## 2015-08-31 LAB — MRSA PCR SCREENING: MRSA BY PCR: NEGATIVE

## 2015-08-31 LAB — MAGNESIUM: Magnesium: 1.6 mg/dL — ABNORMAL LOW (ref 1.7–2.4)

## 2015-08-31 LAB — GLUCOSE, CAPILLARY: Glucose-Capillary: 100 mg/dL — ABNORMAL HIGH (ref 65–99)

## 2015-08-31 LAB — HEPATITIS B SURFACE ANTIGEN: Hepatitis B Surface Ag: NEGATIVE

## 2015-08-31 IMAGING — CR DG CHEST 1V PORT
2 series · 2 of 2 positions shown · non-contrast
Comparison: [DATE]

CLINICAL DATA: Acute encephalopathy.

EXAM:
PORTABLE CHEST 1 VIEW

[ap portable (1 of 2)]
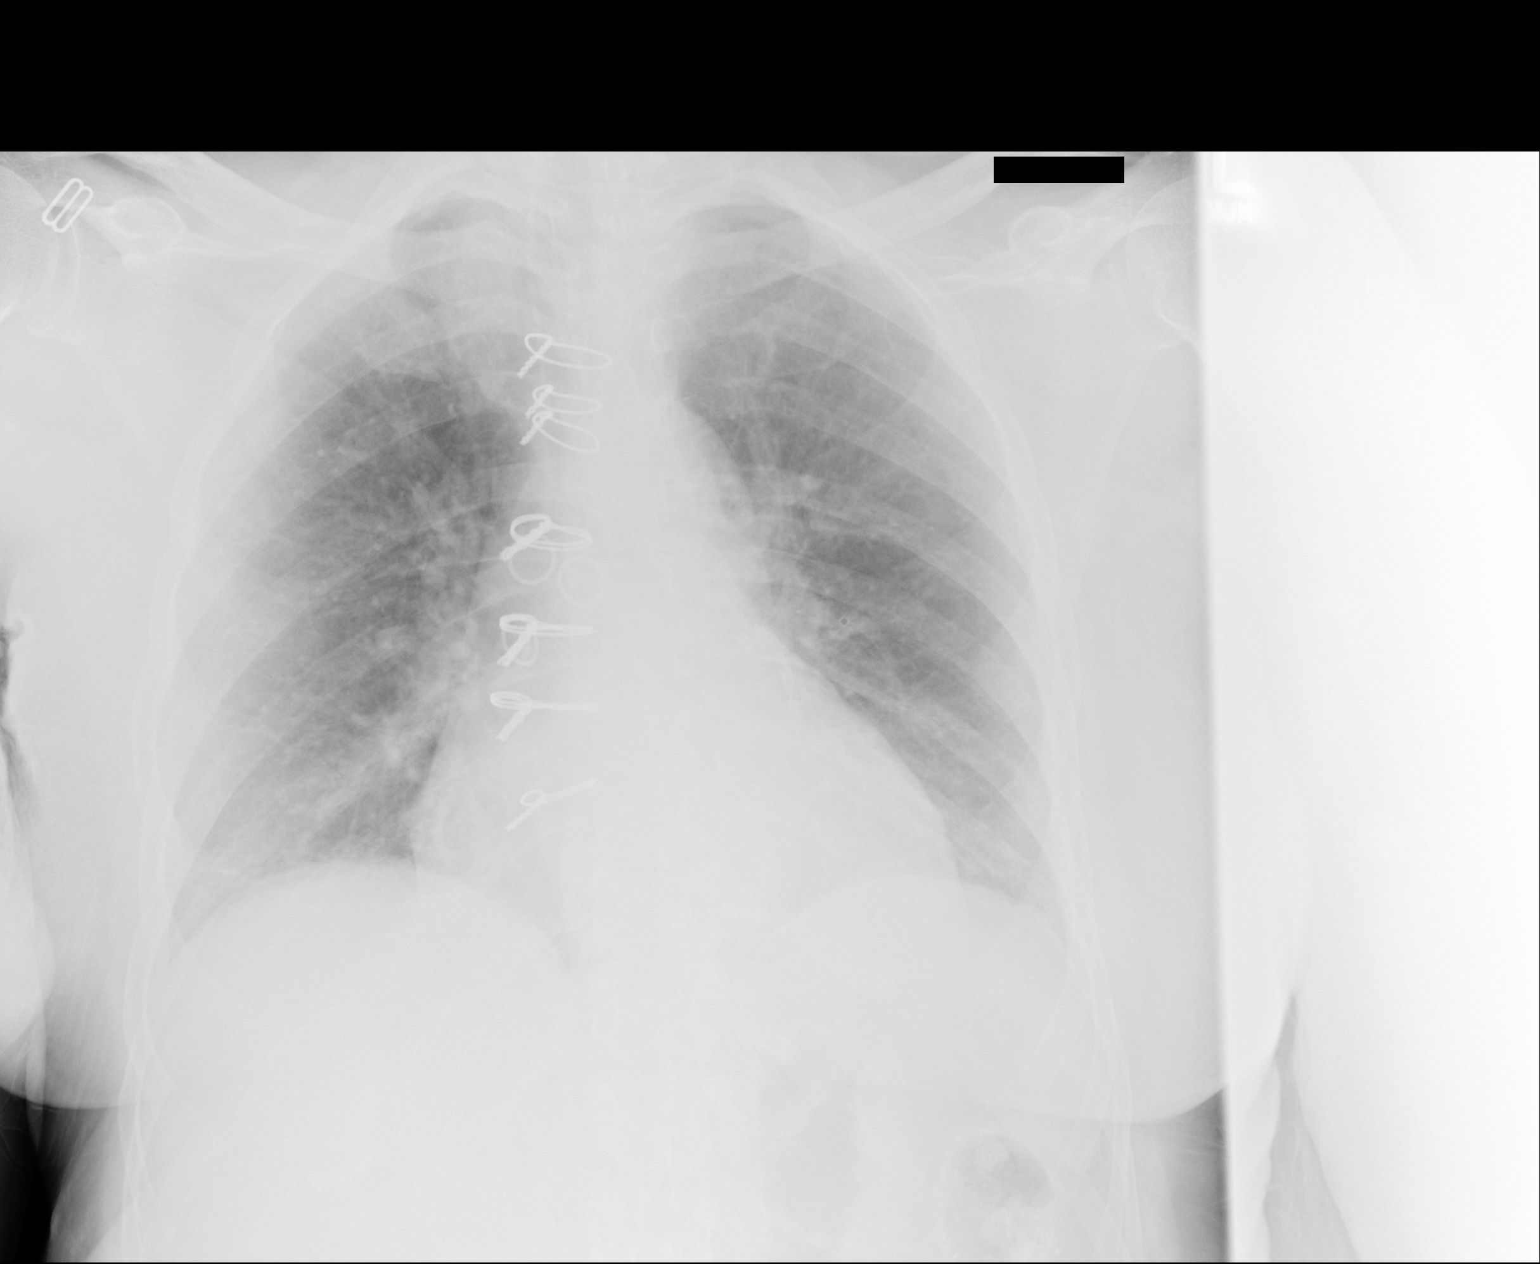

[ap portable (2 of 2)]
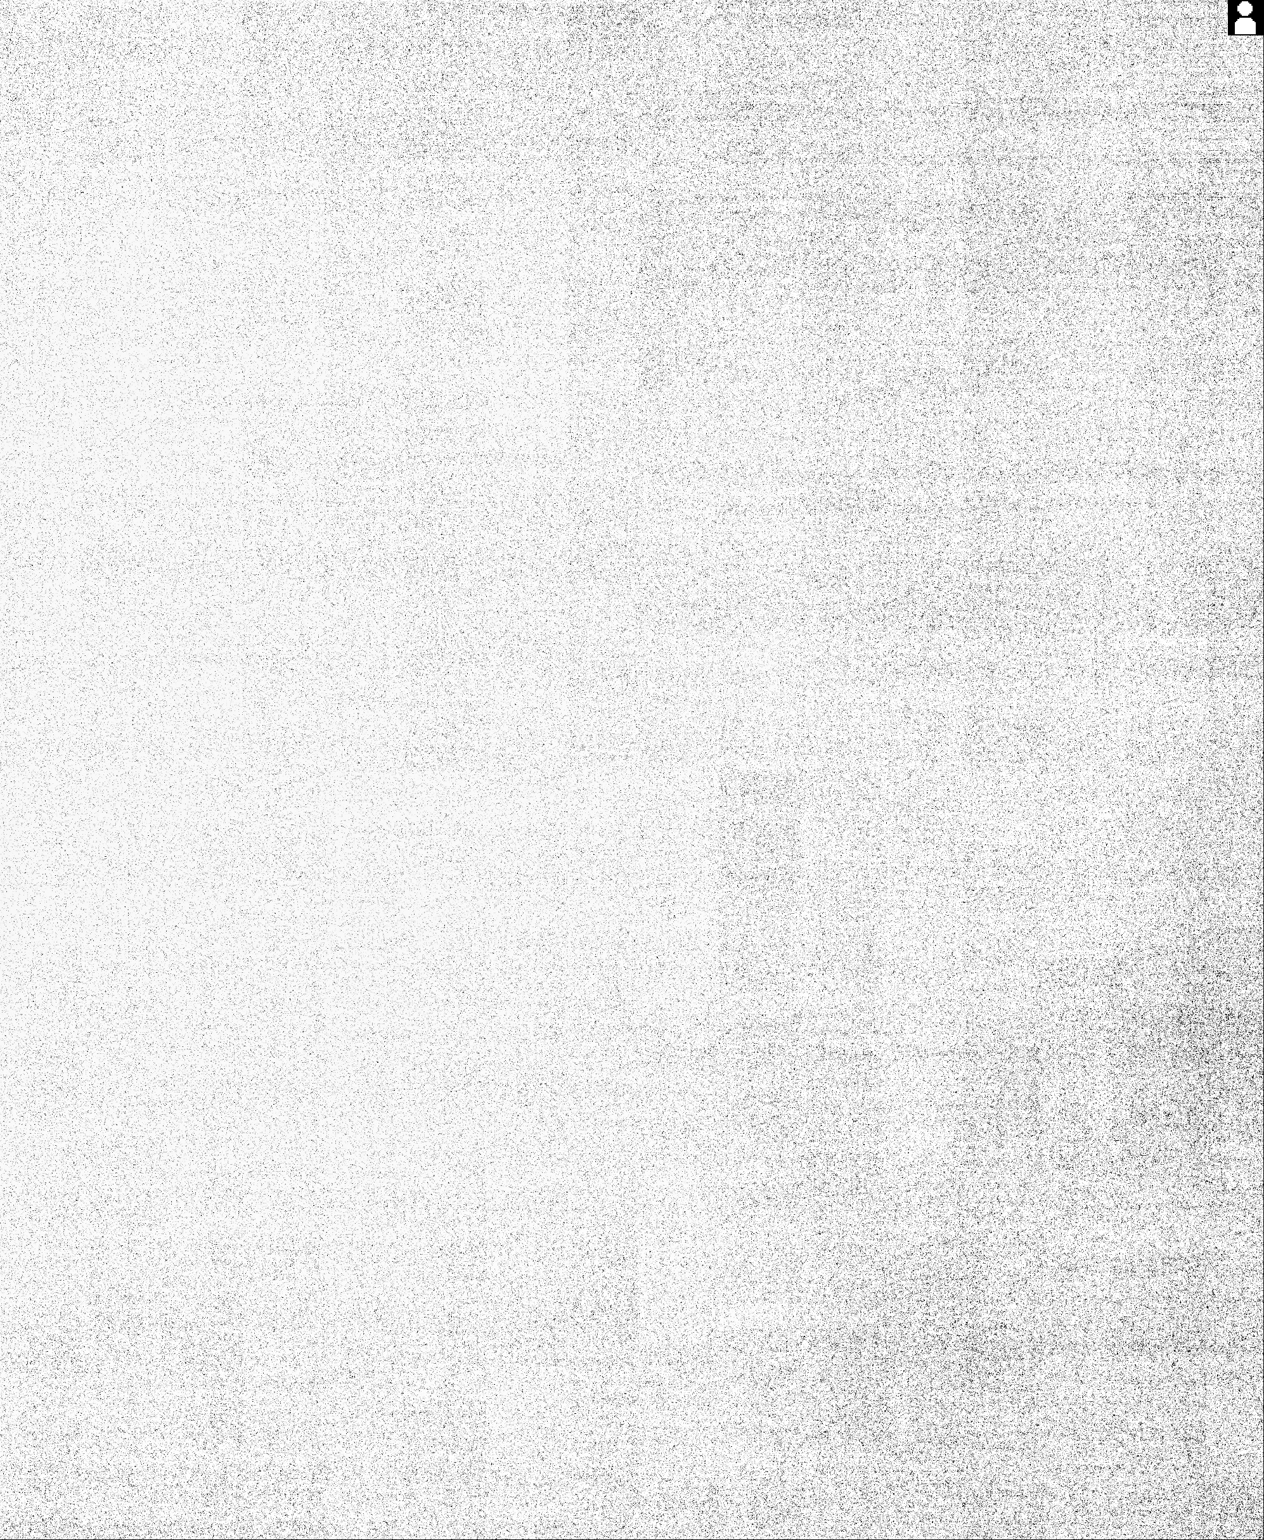

[2 of 2 positions shown; findings below may reference images not displayed]

FINDINGS: [CX] hours. Interval increase in interstitial opacity. No focal
airspace consolidation. No overt airspace pulmonary edema or pleural
effusion. The cardio pericardial silhouette is enlarged. Patient is
status post CABG. The visualized bony structures of the thorax are
intact.
IMPRESSION: Cardiomegaly with interval increase in interstitial opacity, likely
edema given the rapid onset.

## 2015-08-31 IMAGING — CT CT HEAD W/O CM
1 series · 16 of 30 positions shown, 20 images · non-contrast
Comparison: MRI from [DATE].  Head CT from [DATE].

CLINICAL DATA: Fatigue and weakness for 2 days.

EXAM:
CT HEAD WITHOUT CONTRAST
TECHNIQUE: Contiguous axial images were obtained from the base of the skull
through the vertex without intravenous contrast.

[Series 2: headseq 4.8 h37s · axial · 0.43mm/px · z∈[+315,+460]mm · 16 of 34 slices shown, 20 images]
[im 2/34  brain]
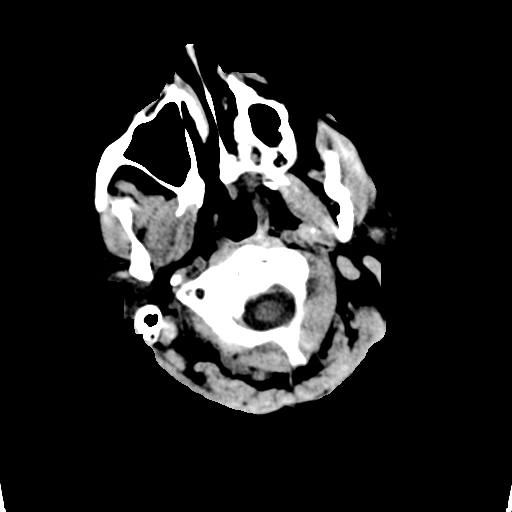
[im 2/34  bone]
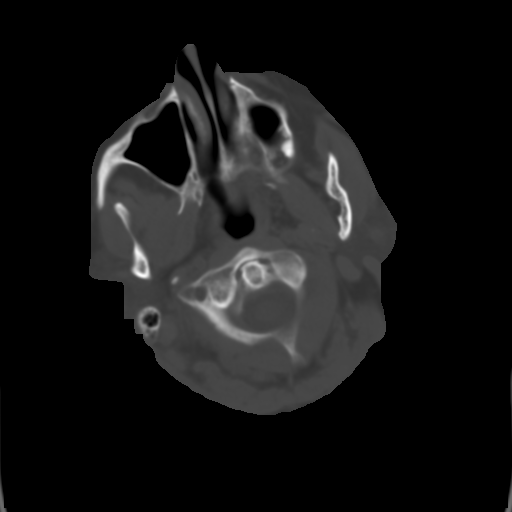
[im 4/34  brain]
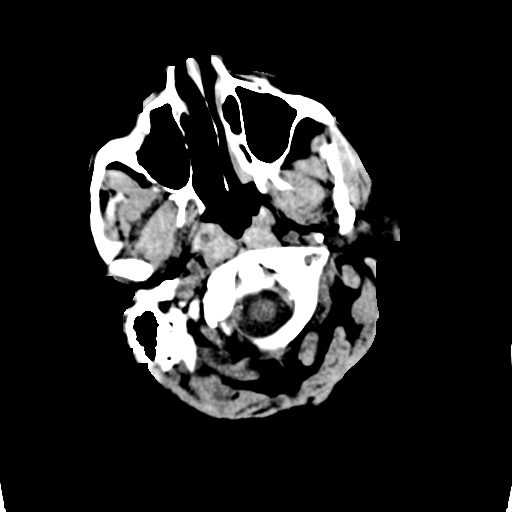
[im 6/34  brain]
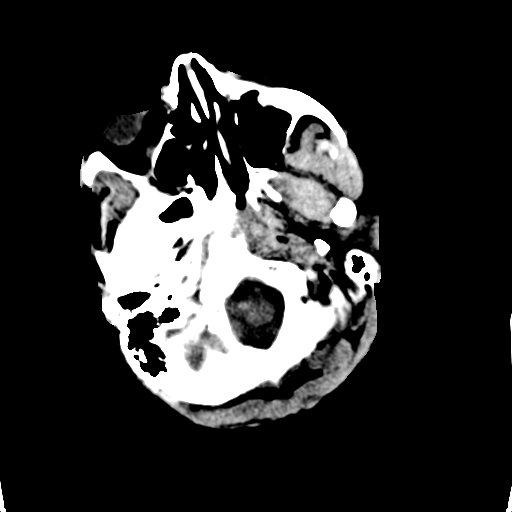
[im 8/34  brain]
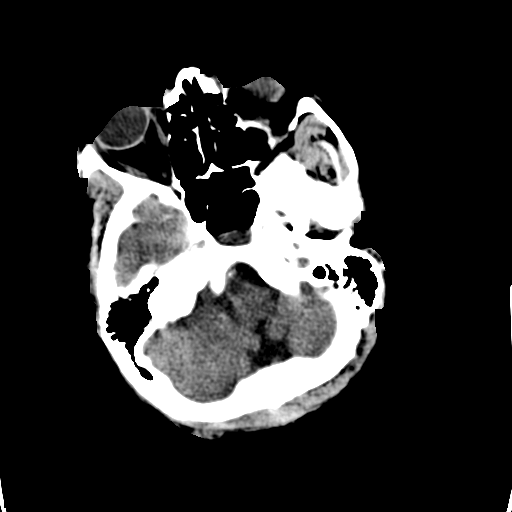
[im 10/34  brain]
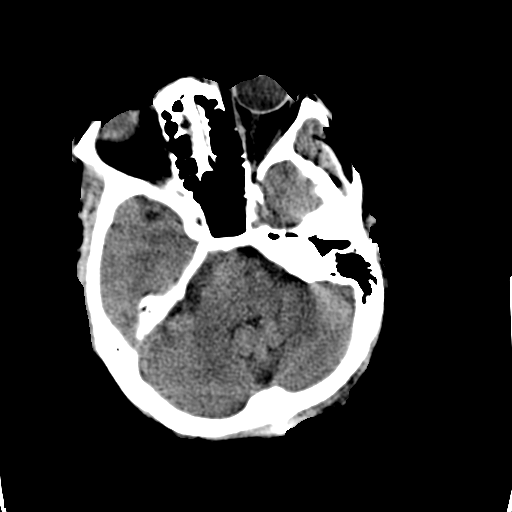
[im 10/34  bone]
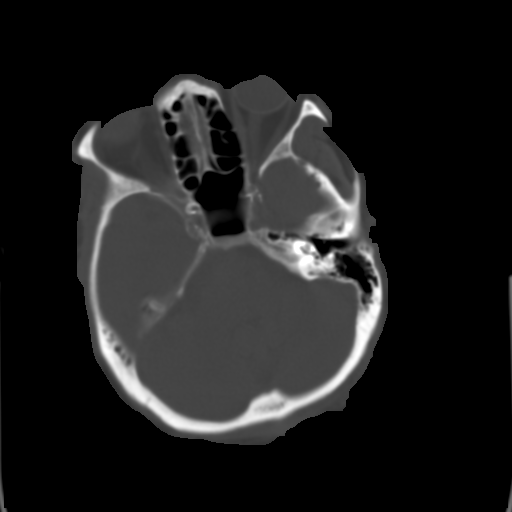
[im 12/34  brain]
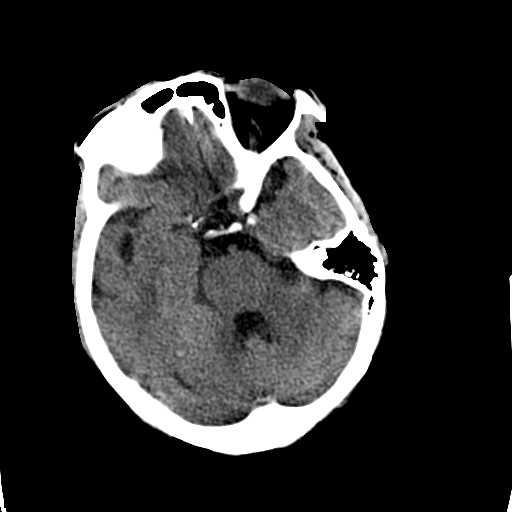
[im 14/34  brain]
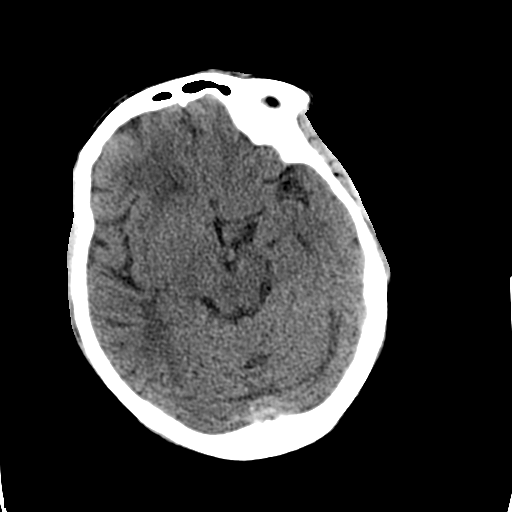
[im 16/34  brain]
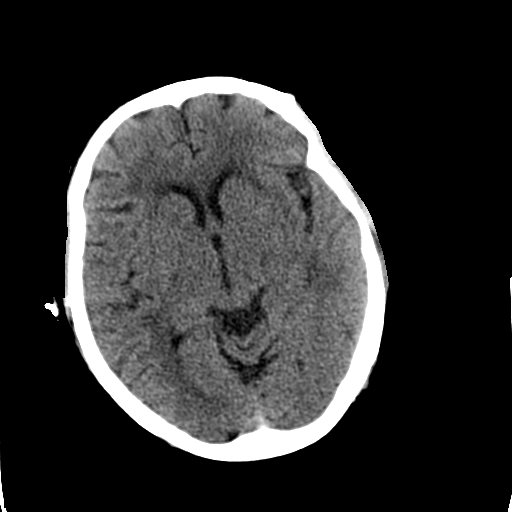
[im 18/34  brain]
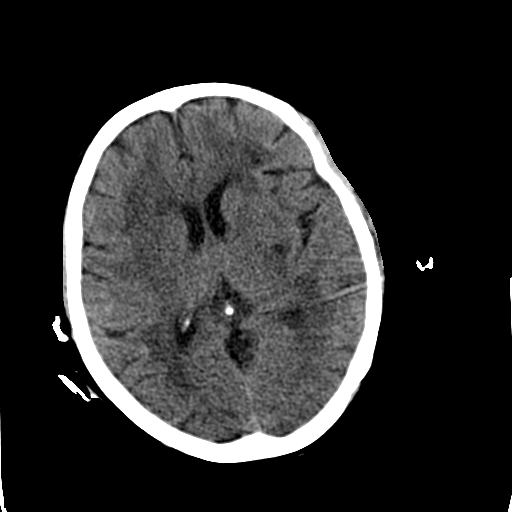
[im 18/34  bone]
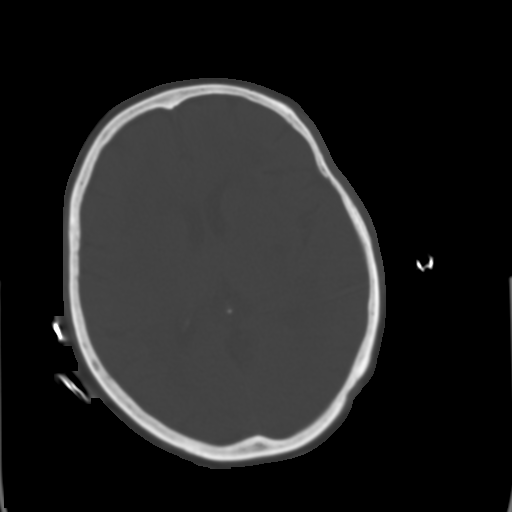
[im 20/34  brain]
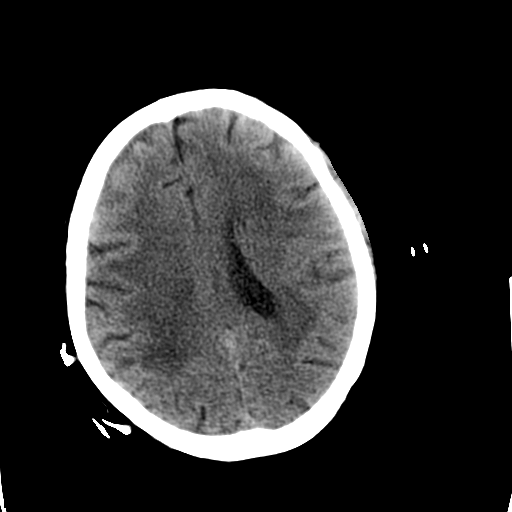
[im 22/34  brain]
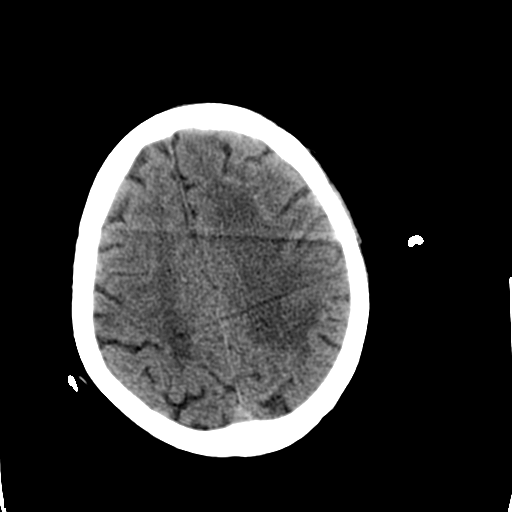
[im 24/34  brain]
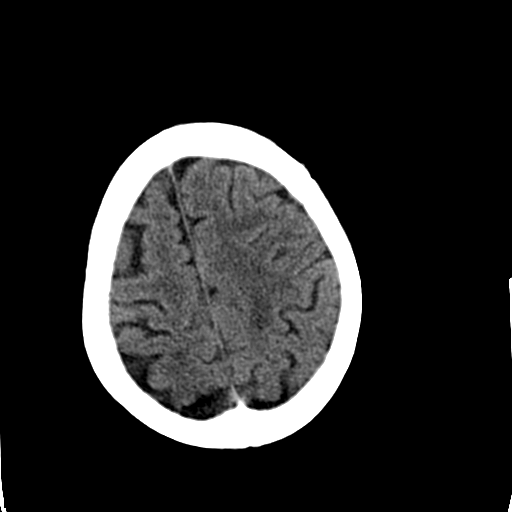
[im 26/34  brain]
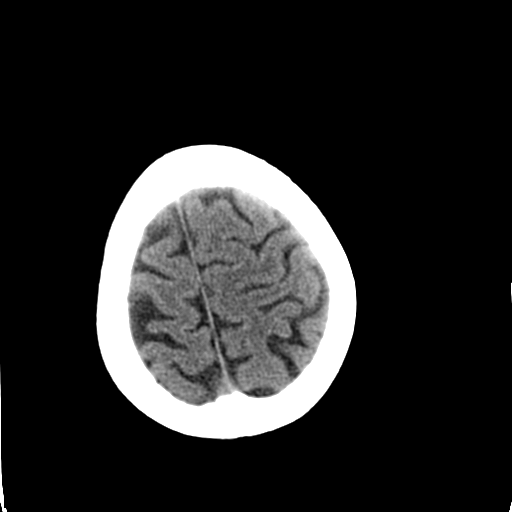
[im 26/34  bone]
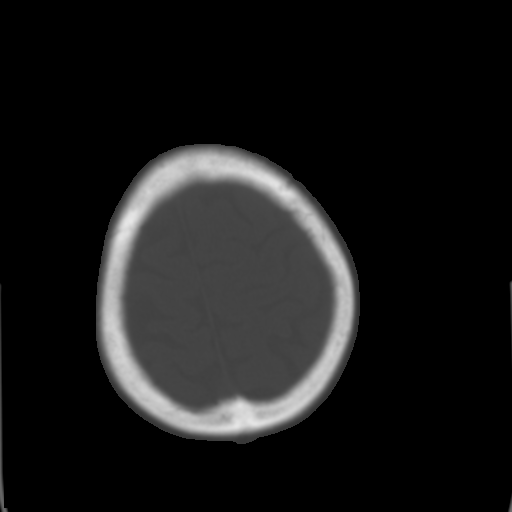
[im 28/34  brain]
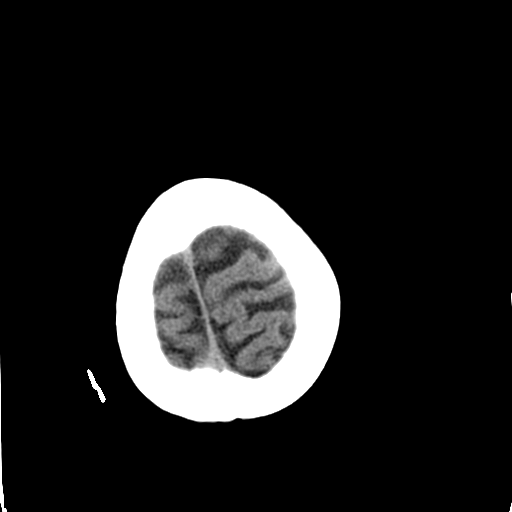
[im 30/34  brain]
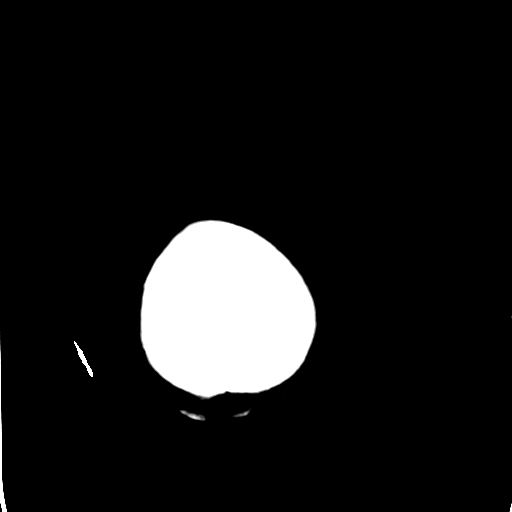
[im 32/34  brain]
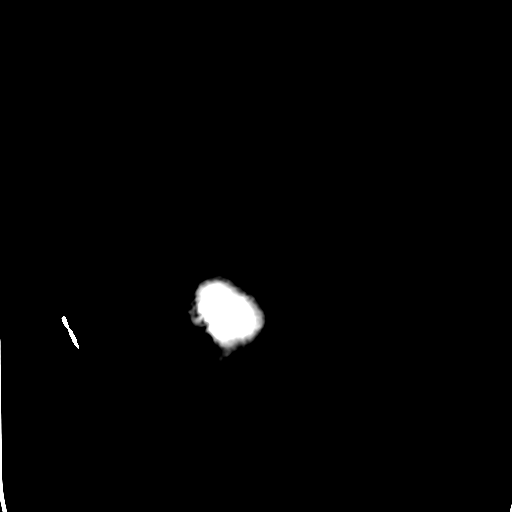

[16 of 30 positions shown; findings below may reference images not displayed]

FINDINGS: There is no evidence for acute hemorrhage, hydrocephalus, mass
lesion, or abnormal extra-axial fluid collection. No definite CT
evidence for acute infarction. Patchy low attenuation in the deep
hemispheric and periventricular white matter is nonspecific, but
likely reflects chronic microvascular ischemic demyelination.
Lacunar infarct is seen in the left basal ganglia. Air-fluid level
is identified in the left sphenoid sinus. The remaining visualized
paranasal sinuses are clear. No evidence for fluid in the mastoid
air cells.
IMPRESSION: 1. No acute intracranial abnormality.
2. Chronic small vessel white matter ischemic disease.
3. Air-fluid level in the left sphenoid sinus. Imaging features are
compatible with acute sinusitis.

## 2015-08-31 MED ORDER — LIDOCAINE-PRILOCAINE 2.5-2.5 % EX CREA
1.0000 "application " | TOPICAL_CREAM | CUTANEOUS | Status: DC | PRN
  Filled 2015-08-31: qty 5

## 2015-08-31 MED ORDER — EPOETIN ALFA 10000 UNIT/ML IJ SOLN
10000.0000 [IU] | INTRAMUSCULAR | Status: DC
  Filled 2015-08-31 (×2): qty 1

## 2015-08-31 MED ORDER — EPOETIN ALFA 10000 UNIT/ML IJ SOLN
10000.0000 [IU] | INTRAMUSCULAR | Status: DC
  Administered 2015-08-31 – 2015-09-02 (×2): 10000 [IU] via INTRAVENOUS
  Filled 2015-08-31 (×3): qty 1

## 2015-08-31 MED ORDER — DIPHENHYDRAMINE HCL 25 MG PO CAPS
ORAL_CAPSULE | ORAL | Status: AC
  Filled 2015-08-31: qty 1

## 2015-08-31 MED ORDER — EPOETIN ALFA 10000 UNIT/ML IJ SOLN
INTRAMUSCULAR | Status: AC
  Administered 2015-08-31: 10000 [IU] via INTRAVENOUS
  Filled 2015-08-31: qty 1

## 2015-08-31 MED ORDER — DIPHENHYDRAMINE HCL 25 MG PO CAPS
25.0000 mg | ORAL_CAPSULE | ORAL | Status: DC | PRN
  Administered 2015-08-31: 25 mg via ORAL

## 2015-08-31 MED ORDER — LIDOCAINE HCL (PF) 1 % IJ SOLN: 5.0000 mL | INTRAMUSCULAR | Status: DC | PRN

## 2015-08-31 MED ORDER — OSELTAMIVIR PHOSPHATE 30 MG PO CAPS: 30.0000 mg | ORAL_CAPSULE | ORAL | Status: DC

## 2015-08-31 MED ORDER — SODIUM CHLORIDE 0.9 % IV SOLN: 100.0000 mL | INTRAVENOUS | Status: DC | PRN

## 2015-08-31 MED ORDER — PENTAFLUOROPROP-TETRAFLUOROETH EX AERO
1.0000 | INHALATION_SPRAY | CUTANEOUS | Status: DC | PRN
Start: 2015-08-31 — End: 2015-09-03
  Filled 2015-08-31: qty 30

## 2015-08-31 NOTE — Evaluation (Signed)
CTSP for fever, unresponsiveness. Per RN tolerated HD well and was doing well at 4 PM. Checked on at 6 PM and poorly responsive.  On exam 103.4 116/42 94 16 92% on RA Alerts to voice, does not follow commands PERRL Moves all extremities spontaneously Lungs clear CV RRR no m/r/g. Abd soft No tremor; some eye lid flutter  Meds: Benadryl at 2 pm. No narcotics, no BZD.  A/P Acute encephalopathy, possible seizure Fever Influenza A  Stat CT head Stat CXR CBC, BMP, lactic acid ABG.  D/w Dr. Dyann Kief will f/u tonight.  D/w daughter and husband at bedside  Murray Hodgkins, MD [facility] [phone]

## 2015-08-31 NOTE — Progress Notes (Signed)
Report called and given to [facility] in ICU. Patient wheeled down to ICU room 1 via bed accompanied by Nurse Tech and RN. Husband was at the bedside and went down to ICU along with the patient.

## 2015-08-31 NOTE — Discharge Summary (Signed)
Physician Discharge Summary  Shawna [name] [id number] DOB: 07-08-1941 DOA: 08/30/2015  PCP: [name], MD  Admit date: 08/30/2015 Discharge date: 08/31/2015  Recommendations for Outpatient Follow-up:  1. Follow-up with PCP as below 2. Follow-up anemia of CKD, suggest CBC check at hemodialysis    Follow-up Information    Follow up with [name], MD. Call today.   Specialty:  Family Medicine   Why:  recheck in 3 days if no improvement   Contact information:   [address] [place] [address] [phone]      Discharge Diagnoses:  1. Influenza A with fever 2. ESRD on hemodialysis  3. Anemia of CKD 4. PMH chronic bronchitis.   Discharge Condition: Improved  Disposition: Discharge home  Diet recommendation: Heart healthy   Filed Weights   08/30/15 0927 08/30/15 1743 08/31/15 1105  Weight: 62.143 kg (137 lb) 62.5 kg (137 lb 12.6 oz) 62.3 kg (137 lb 5.6 oz)    History of present illness:  74yow past medical history end-stage renal disease on hemodialysis Monday Wednesday Friday, presented to the emergency department with acute onset of cough, chills, fever and shortness of breath within the last 24 hours. Initial evaluation revealed acute influenza A and plans were made for observation.  Hospital Course:  On presentation patient was found to have positive influenza A, for which she was observed overnight for observation. She presented with acute onset of cough, chills, fever and SOB which improved with Tamiflu and supportive care. Nephrology was consulted for routine HD. Hospitalization was uncomplicated. She is ambulating without difficulty and has no hypoxia.   Individual issues as below:  1. Influenza A with fever, SOB. On admission no hypoxia, lactic acid 2.01. No evidence of sepsis. Afebrile this morning has resolved.  2. ESRD on hemodialysis. Per nephrology HD today.  3. Anemia of CKD, stable. Slightly limited below baseline. No bleeding. Possible component of  acute illness. No indication for transfusion at this time. She is asymptomatic with stable vital signs and no hypoxia. 4. PMH chronic bronchitis.  5. HLD. Continue statin  Consultants:  Nephrology   Procedures  None  Discharge Instructions  Discharge Instructions    Activity as tolerated - No restrictions    Complete by:  As directed      Diet - low sodium heart healthy    Complete by:  As directed      Discharge instructions    Complete by:  As directed   Call your physician or seek immediate medical attention for shortness of breath, worsening fever, vomiting or worsening of condition.          Current Discharge Medication List    START taking these medications   Details  benzonatate (TESSALON) 100 MG capsule Take 1 capsule (100 mg total) by mouth every 8 (eight) hours. Qty: 21 capsule, Refills: 0    oseltamivir (TAMIFLU) 30 MG capsule Take 1 capsule (30 mg total) by mouth every Monday, Wednesday, and Friday with hemodialysis. Qty: 2 capsule, Refills: 0      CONTINUE these medications which have NOT CHANGED   Details  acetaminophen (TYLENOL) 325 MG tablet Take 2 tablets (650 mg total) by mouth every 6 (six) hours as needed for mild pain (mild pain).    ALPRAZolam (XANAX) 0.5 MG tablet Take 1 tablet (0.5 mg total) by mouth 3 (three) times daily. Qty: 30 tablet, Refills: 1    amLODipine (NORVASC) 2.5 MG tablet Take 1 tablet (2.5 mg total) by mouth daily. Qty:  30 tablet, Refills: 6    aspirin EC 81 MG tablet Take 81 mg by mouth daily.     cloNIDine (CATAPRES) 0.3 MG tablet Take 1 tablet (0.3 mg total) by mouth 2 (two) times daily. Qty: 60 tablet, Refills: 6    fluticasone (FLONASE) 50 MCG/ACT nasal spray Place 2 sprays into the nose daily.     folic acid-vitamin b complex-vitamin c-selenium-zinc (DIALYVITE) 3 MG TABS Take 1 tablet by mouth daily.    hydrALAZINE (APRESOLINE) 50 MG tablet Take 2 tablets (100 mg total) by mouth 3 (three) times daily. Qty: 540  tablet, Refills: 1    isosorbide dinitrate (ISORDIL) 30 MG tablet Take 1 tablet (30 mg total) by mouth 3 (three) times daily. Qty: 90 tablet, Refills: 6    labetalol (NORMODYNE) 200 MG tablet Take 1.5 tablets (300 mg total) by mouth 2 (two) times daily. Qty: 60 tablet, Refills: 6    lanthanum (FOSRENOL) 1000 MG chewable tablet Chew 1,000 mg by mouth 3 (three) times daily after meals.    loratadine (CLARITIN) 10 MG tablet Take 10 mg by mouth daily.     nitroGLYCERIN (NITROSTAT) 0.4 MG SL tablet Place 1 tablet (0.4 mg total) under the tongue every 5 (five) minutes x 3 doses as needed for chest pain. Qty: 25 tablet, Refills: 3    omeprazole (PRILOSEC) 20 MG capsule Take 1 capsule (20 mg total) by mouth daily. Qty: 30 capsule, Refills: 5    ondansetron (ZOFRAN-ODT) 4 MG disintegrating tablet Take 4 mg by mouth every 8 (eight) hours as needed. nausea    polyethylene glycol (MIRALAX / GLYCOLAX) packet Take 17 g by mouth daily. Qty: 14 each, Refills: 0    PROAIR HFA 108 (90 BASE) MCG/ACT inhaler Inhale 1 puff into the lungs every 6 (six) hours as needed for wheezing or shortness of breath.     SENSIPAR 30 MG tablet Take 30 mg by mouth daily with supper.     simvastatin (ZOCOR) 20 MG tablet TAKE ONE TABLET BY MOUTH AT BEDTIME Qty: 30 tablet, Refills: 6    tetrahydrozoline 0.05 % ophthalmic solution Place 1 drop into both eyes 2 (two) times daily as needed (irritation).      STOP taking these medications     cephALEXin (KEFLEX) 500 MG capsule        Allergies  Allergen Reactions  . Aspirin Other (See Comments)    Mess up her stomach; "makes my bowels have blood in them". Takes 81 mg EC Aspirin   . Contrast Media [Iodinated Diagnostic Agents] Itching  . Iron Itching and Other (See Comments)    "they gave me iron in dialysis; had to give me Benadryl cause I had to have the iron" (05/02/2012)  . Macrodantin [Nitrofurantoin Macrocrystal] Other (See Comments)    "broke me out in big  old knots all over my body; had to go to ER"  . Penicillins Other (See Comments)    "makes me real weak when I take it; like I'll pass out"  . Plavix [Clopidogrel Bisulfate] Rash  . Amlodipine Swelling    Leg swelling  . Bactrim [Sulfamethoxazole-Trimethoprim] Rash  . Dexilant [Dexlansoprazole] Other (See Comments)    Upset stomach  . Morphine And Related Itching    Itching in feet  . Nitrofurantoin Hives  . Prilosec [Omeprazole] Other (See Comments)    "back spasms"  . Sulfa Antibiotics Rash  . Venofer [Ferric Oxide] Itching    Patient reports using Benadryl prior to doses as [facility]  . Levaquin [Levofloxacin In D5w] Rash  . Protonix [Pantoprazole Sodium] Rash    The results of significant diagnostics from this hospitalization (including imaging, microbiology, ancillary and laboratory) are listed below for reference.    Significant Diagnostic Studies: Dg Chest 2 View  08/30/2015  CLINICAL DATA:  Insert epic study EXAM: CHEST  2 VIEW COMPARISON:  06/07/2015 FINDINGS: Status post median sternotomy and CABG. The heart is mildly enlarged. There are no focal consolidations pleural effusions. No pulmonary edema. Contrast is identified within nondilated loops of colon. IMPRESSION: 1. Mild cardiomegaly. 2.  No evidence for acute cardiopulmonary abnormality. 3. Retained contrast. Electronically Signed   By: Nolon Nations M.D.   On: 08/30/2015 10:19     Labs: Basic Metabolic Panel:  Recent Labs Lab 08/30/15 1025 08/31/15 0647  NA 143 141  K 5.1 5.8*  CL 101 101  CO2 25 22  GLUCOSE 107* 85  BUN 53* 73*  CREATININE 10.69* 13.71*  CALCIUM 8.8* 8.3*   Liver Function Tests:  Recent Labs Lab 08/30/15 1025  AST 26  ALT 17  ALKPHOS 130*  BILITOT 0.4  PROT 6.9  ALBUMIN 4.0   CBC:  Recent Labs Lab 08/30/15 1025 08/31/15 0647  WBC 11.0* 9.3  NEUTROABS 9.4*  --   HGB 8.7* 7.5*  HCT 26.9* 23.4*  MCV 101.1* 100.9*  PLT 217 175      Principal Problem:    Influenza A Active Problems:   ESRD on hemodialysis (HCC)   Anemia of chronic kidney failure   Time coordinating discharge: 41minutes   Signed:  Murray Hodgkins, MD [facility] 08/31/2015, 12:42 PM    By signing my name below, I, Rennis Harding attest that this documentation has been prepared under the direction and in the presence of Murray Hodgkins, MD Electronically signed: Rennis Harding 08/31/2015  9:49am   I personally performed the services described in this documentation. All medical record entries made by the scribe were at my direction. I have reviewed the chart and agree that the record reflects my personal performance and is accurate and complete. Murray Hodgkins, MD

## 2015-08-31 NOTE — Progress Notes (Signed)
[facility] notified requesting order for benadryl per pt request for itching.

## 2015-08-31 NOTE — Clinical Social Work Note (Signed)
CSW spoke with patient's nurse and CM. Both advised that patient does not have any CSW needs.  CSW signing off.    Ihor Gully, [place] [phone]

## 2015-08-31 NOTE — Consult Note (Signed)
Reason for Consult: End-stage renal disease Referring Physician: Dr. Gwenyth Allegra Shawna [name] is an 75 y.o. female.  HPI: She is a patient with history of hypertension, coronary artery disease status post bypass surgery, history of anxiety disorder, history of end-stage renal disease on maintenance hemodialysis presently came with complaints of cough, whitish sputum production, fever and chills since Saturday. According to patient she has also some cough before and she was put on antibiotics as an outpatient. Presently she did finish her antibiotics when she came to the emergency room. When she was evaluated she was found to have influenza A hence admitted to the hospital. Patient has some difficulty breathing and also some stuffiness of her nose. Her appetite is good she doesn't have any nausea or vomiting.  Past Medical History  Diagnosis Date  . Chronic bronchitis ([place])   . GERD (gastroesophageal reflux disease)   . PUD (peptic ulcer disease)   . History of lower GI bleeding   . Arthritis   . History of gout   . CAD (coronary artery disease)     a. 12/2011 NSTEMI/Cath/PCI LCX (2.25x14 Resolute DES) & D1 (2.25x22 Resolute DES);  b. 01/2012 Cath/PCI: LM 30, LAD 30p, 40-50m D1 stent ok, 99 in sm branch of diag, LCX patent stent, OM1 20, RCA 95 ost (4.0x12 Promus DES), EF 55%;  c. 04/2012 Lexi Cardiolite  EF 48%, small area of scar @ base/mid inflat wall with mild peri-infarct ischemia.; CABG 12/4  . High cholesterol 12/2011  . Pneumonia ~ 2009  . Iron deficiency anemia   . TIA (transient ischemic attack)   . Anxiety   . History of blood transfusion 07/2011; 12/2011; 01/2012 X 2; 04/2012  . Carotid artery disease ([place]     a. 648-88%LICA, 99/1694  . Mitral regurgitation     a. Moderate by echo, 02/2012  . Myocardial infarction ([place]   . Chronic diastolic CHF (congestive heart failure) ([place]     a. 02/2012 Echo EF 60-65%, nl wall motion, Gr 1 DD, mod MR  . Hypertension   . AVM (arteriovenous  malformation) of colon   . Esophageal stricture   . [place]varian cancer ([place] 1992  . Colon cancer ([place] 1992  . ESRD on hemodialysis ([place]     ESRD due to HTN, started [facility] 2011 [facility]  Access is LUA AVF as of Sept 2014.     Past Surgical History  Procedure Laterality Date  . Abdominal hysterectomy  1992  . Appendectomy  06/1990  . Tubal ligation  1980's  . Av fistula placement  07/2009    left upper arm  . Thrombectomy / arteriovenous graft revision  2011    left upper arm  . Colon resection  1992  . Esophagogastroduodenoscopy  01/20/2012    Procedure: ESOPHAGOGASTRODUODENOSCOPY (EGD);  Surgeon: MLadene Artist MD,FACG;  [facility];  Service: Endoscopy;  Laterality: N/A;  . Dilation and curettage of uterus    . Coronary angioplasty with stent placement  12/15/11    "2"  . Coronary angioplasty with stent placement  y/2013    "1; makes total of 3" (05/02/2012)  . Coronary artery bypass graft  06/13/2012    Procedure: CORONARY ARTERY BYPASS GRAFTING (CABG);  Surgeon: EGrace Isaac MD;  [place]  Service: Open Heart Surgery;  Laterality: N/A;  cabg x four;  using left internal mammary artery, and left leg greater saphenous vein harvested  endoscopically  . Intraoperative transesophageal echocardiogram  06/13/2012    Procedure: INTRAOPERATIVE TRANSESOPHAGEAL ECHOCARDIOGRAM;  Surgeon: Grace Isaac, MD;  Location: [place];  Service: Open Heart Surgery;  Laterality: N/A;  . Esophagogastroduodenoscopy N/A 03/26/2013    Procedure: ESOPHAGOGASTRODUODENOSCOPY (EGD);  Surgeon: Irene Shipper, MD;  Location: [facility] ENDOSCOPY;  Service: Endoscopy;  Laterality: N/A;  . Ovary surgery      ovarian cancer  . Left heart catheterization with coronary angiogram N/A 12/15/2011    Procedure: LEFT HEART CATHETERIZATION WITH CORONARY ANGIOGRAM;  Surgeon: Burnell Blanks, MD;  [facility] LAB;  Service: Cardiovascular;   Laterality: N/A;  . Left heart catheterization with coronary angiogram N/A 01/10/2012    Procedure: LEFT HEART CATHETERIZATION WITH CORONARY ANGIOGRAM;  Surgeon: Peter M Martinique, MD;  Location: [facility] CATH LAB;  Service: Cardiovascular;  Laterality: N/A;  . Left heart catheterization with coronary angiogram N/A 06/08/2012    Procedure: LEFT HEART CATHETERIZATION WITH CORONARY ANGIOGRAM;  Surgeon: Burnell Blanks, MD;  Location: [facility] C[facility] LAB;  Service: Cardiovascular;  Laterality: N/A;  . Shuntogram N/A 10/15/2013    Procedure: Fistulogram;  Surgeon: Serafina Mitchell, MD;  Location: [facility] CATH LAB;  Service: Cardiovascular;  Laterality: N/A;  . Left heart catheterization with coronary/graft angiogram N/A 12/10/2013    Procedure: LEFT HEART CATHETERIZATION WITH Beatrix Fetters;  Surgeon: [name], MD;  Location[facility] C[facility] LAB;  Service: Cardiovascular;  Laterality: N/A;  . Esophagogastroduodenoscopy N/A 04/30/2015    Procedure: ESOPHAGOGASTRODUODENOSCOPY (EGD);  Surgeon: Rogene Houston, MD;  [facility];  Service: Endoscopy;  Laterality: N/A;  1pm - moved to 10/20 @ 1:10    Family History  Problem Relation Age of Onset  . Other      noncontributory for early CAD  . Heart disease Mother     Heart Disease before age 54  . Hyperlipidemia Mother   . Hypertension Mother   . Diabetes Mother   . Heart attack Mother   . Heart disease Father     Heart Disease before age 2  . Hyperlipidemia Father   . Hypertension Father   . Diabetes Father   . Diabetes Sister   . Hypertension Sister   . Diabetes Brother   . Hyperlipidemia Brother   . Heart attack Brother   . Colon cancer Neg Hx   . Esophageal cancer Neg Hx   . Liver disease Neg Hx   . Kidney disease Neg Hx   . Colon polyps Neg Hx   . Hypertension Sister   . Heart attack Brother     Social History:  reports that she has never smoked. She has never used smokeless tobacco. She reports that she does not drink alcohol  or use illicit drugs.  Allergies:  Allergies  Allergen Reactions  . Aspirin Other (See Comments)    Mess up her stomach; "makes my bowels have blood in them". Takes 81 mg EC Aspirin   . Contrast Media [Iodinated Diagnostic Agents] Itching  . Iron Itching and Other (See Comments)    "they gave me iron in dialysis; had to give me Benadryl cause I had to have the iron" (05/02/2012)  . Macrodantin [Nitrofurantoin Macrocrystal] Other (See Comments)    "broke me out in big old knots all over my body; had to go to ER"  . Penicillins Other (See Comments)    "makes me real weak when I take it; like I'll pass out"  . Plavix [Clopidogrel Bisulfate] Rash  .  Amlodipine Swelling    Leg swelling  . Bactrim [Sulfamethoxazole-Trimethoprim] Rash  . Dexilant [Dexlansoprazole] Other (See Comments)    Upset stomach  . Morphine And Related Itching    Itching in feet  . Nitrofurantoin Hives  . Prilosec [Omeprazole] Other (See Comments)    "back spasms"  . Sulfa Antibiotics Rash  . Venofer [Ferric Oxide] Itching    Patient reports using Benadryl prior to doses as [facility]  . Levaquin [Levofloxacin In D5w] Rash  . Protonix [Pantoprazole Sodium] Rash    Medications: I have reviewed the patient's current medications.  Results for orders placed or performed during the hospital encounter of 08/30/15 (from the past 48 hour(s))  Influenza panel by PCR (type A & B, H1N1)     Status: Abnormal   Collection Time: 08/30/15 10:15 AM  Result Value Ref Range   Influenza A By PCR POSITIVE (A) NEGATIVE   Influenza B By PCR NEGATIVE NEGATIVE   H1N1 flu by pcr NOT DETECTED NOT DETECTED    Comment:        The Xpert Flu assay (FDA approved for nasal aspirates or washes and nasopharyngeal swab specimens), is intended as an aid in the diagnosis of influenza and should not be used as a sole basis for treatment.   CBC with Differential/Platelet     Status: Abnormal   Collection Time: 08/30/15 10:25 AM   Result Value Ref Range   WBC 11.0 (H) 4.0 - 10.5 K/uL   RBC 2.66 (L) 3.87 - 5.11 MIL/uL   Hemoglobin 8.7 (L) 12.0 - 15.0 g/dL   HCT 26.9 (L) 36.0 - 46.0 %   MCV 101.1 (H) 78.0 - 100.0 fL   MCH 32.7 26.0 - 34.0 pg   MCHC 32.3 30.0 - 36.0 g/dL   RDW 16.3 (H) 11.5 - 15.5 %   Platelets 217 150 - 400 K/uL   Neutrophils Relative % 85 %   Neutro Abs 9.4 (H) 1.7 - 7.7 K/uL   Lymphocytes Relative 4 %   Lymphs Abs 0.5 (L) 0.7 - 4.0 K/uL   Monocytes Relative 3 %   Monocytes Absolute 0.3 0.1 - 1.0 K/uL   Eosinophils Relative 8 %   Eosinophils Absolute 0.8 (H) 0.0 - 0.7 K/uL   Basophils Relative 0 %   Basophils Absolute 0.0 0.0 - 0.1 K/uL  Comprehensive metabolic panel     Status: Abnormal   Collection Time: 08/30/15 10:25 AM  Result Value Ref Range   Sodium 143 135 - 145 mmol/L   Potassium 5.1 3.5 - 5.1 mmol/L   Chloride 101 101 - 111 mmol/L   CO2 25 22 - 32 mmol/L   Glucose, Bld 107 (H) 65 - 99 mg/dL   BUN 53 (H) 6 - 20 mg/dL   Creatinine, Ser 10.69 (H) 0.44 - 1.00 mg/dL   Calcium 8.8 (L) 8.9 - 10.3 mg/dL   Total Protein 6.9 6.5 - 8.1 g/dL   Albumin 4.0 3.5 - 5.0 g/dL   AST 26 15 - 41 U/L   ALT 17 14 - 54 U/L   Alkaline Phosphatase 130 (H) 38 - 126 U/L   Total Bilirubin 0.4 0.3 - 1.2 mg/dL   GFR calc non Af Amer 3 (L) >60 mL/min   GFR calc Af Amer 4 (L) >60 mL/min    Comment: (NOTE) The eGFR has been calculated using the CKD EPI equation. This calculation has not been validated in all clinical situations. eGFR's persistently <60 mL/min signify possible Chronic Kidney  Disease.    Anion gap 17 (H) 5 - 15  I-Stat CG4 Lactic Acid, ED     Status: Abnormal   Collection Time: 08/30/15 11:21 AM  Result Value Ref Range   Lactic Acid, Venous 2.01 (HH) 0.5 - 2.0 mmol/L   Comment NOTIFIED PHYSICIAN   Basic metabolic panel     Status: Abnormal   Collection Time: 08/31/15  6:47 AM  Result Value Ref Range   Sodium 141 135 - 145 mmol/L   Potassium 5.8 (H) 3.5 - 5.1 mmol/L   Chloride  101 101 - 111 mmol/L   CO2 22 22 - 32 mmol/L   Glucose, Bld 85 65 - 99 mg/dL   BUN 73 (H) 6 - 20 mg/dL   Creatinine, Ser 13.71 (H) 0.44 - 1.00 mg/dL   Calcium 8.3 (L) 8.9 - 10.3 mg/dL   GFR calc non Af Amer 2 (L) >60 mL/min   GFR calc Af Amer 3 (L) >60 mL/min    Comment: (NOTE) The eGFR has been calculated using the CKD EPI equation. This calculation has not been validated in all clinical situations. eGFR's persistently <60 mL/min signify possible Chronic Kidney Disease.    Anion gap 18 (H) 5 - 15    Dg Chest 2 View  08/30/2015  CLINICAL DATA:  Insert epic study EXAM: CHEST  2 VIEW COMPARISON:  06/07/2015 FINDINGS: Status post median sternotomy and CABG. The heart is mildly enlarged. There are no focal consolidations pleural effusions. No pulmonary edema. Contrast is identified within nondilated loops of colon. IMPRESSION: 1. Mild cardiomegaly. 2.  No evidence for acute cardiopulmonary abnormality. 3. Retained contrast. Electronically Signed   By: Nolon Nations M.D.   On: 08/30/2015 10:19    Review of Systems  Constitutional: Positive for fever and chills.  HENT: Positive for sore throat.   Respiratory: Positive for cough, sputum production and shortness of breath.   Cardiovascular: Positive for chest pain. Negative for orthopnea and leg swelling.  Gastrointestinal: Negative for nausea, vomiting and abdominal pain.  Musculoskeletal: Positive for joint pain.  Neurological: Positive for weakness.   Blood pressure 178/77, pulse 104, temperature 102 F (38.9 C), temperature source Oral, resp. rate 17, height 5' 1"  (1.549 m), weight 137 lb 12.6 oz (62.5 kg), SpO2 95 %. Physical Exam  Constitutional: She is oriented to person, place, and time. No distress.  Eyes: No scleral icterus.  Neck: No JVD present.  Cardiovascular: Normal rate and regular rhythm.   Respiratory: No respiratory distress. She has wheezes. She has no rales. She exhibits no tenderness.  GI: She exhibits no  distension. There is no tenderness.  Musculoskeletal: She exhibits no edema.  Neurological: She is alert and oriented to person, place, and time.    Assessment/Plan: Problem #1 cough/fever: Patient is presently positive for influenza A. Patient has received flu vaccine when she was in dialysis. Chest x-ray no sign of acute infection. Problem #2 end-stage renal disease: She is status post hemodialysis on Friday presently she is due for dialysis today. Patient does not have any nausea or vomiting. Problem #3 hyperkalemia: Potassium slightly high. Problem #4 history of hypertension: Her blood pressure usually fluctuates but has this moment seems to be reasonably controlled. Problem #5 anemia: Her hemoglobin is below target goal Problem #6. Metabolic bone disease: Her calcium is in range. She is on a binder other outpatient. Problem #7 fluid management: Presently she doesn't have any sign of fluid overload. Plan: We'll make arrangements for patient to get dialysis today  for 4 hours 2] we'll use 2K/2.5 calcium bath and remove 3l if her blood pressure tolerates 3] we'll use Epogen 10,000 units IV after each dialysis 4] we'll check her basic metabolic panel, phosphorus and CBC in the morning.  Navy Rothschild S 08/31/2015, 8:40 AM

## 2015-08-31 NOTE — Care Management Obs Status (Signed)
[facility] NOTIFICATION   Patient Details  Name: Shawna [name] MRN: [id number] Date of Birth: [birth year]   Medicare Observation Status Notification Given:  Yes    Alvie Heidelberg, RN 08/31/2015, 8:52 AM

## 2015-08-31 NOTE — Care Management Note (Signed)
Case Management Note  Patient Details  Name: ZANAIYAH PODESTA MRN: [id number] Date of Birth: [birth year]  Subjective/Objective:       Spoke with patient for discharge planning, Patient is alert and oriented from home with husband and independent , No CM needs identified Patient is dialysis MWF at [facility] in [place].     Action/Plan:  Home with self care. Expected Discharge Date:                  Expected Discharge Plan:  Home/Self Care  In-House Referral:     Discharge planning Services  CM Consult  Post Acute Care Choice:    Choice offered to:     DME Arranged:    DME Agency:     HH Arranged:    [place] Agency:     Status of Service:  Completed, signed off  Medicare Important Message Given:    Date Medicare IM Given:    Medicare IM give by:    Date Additional Medicare IM Given:    Additional Medicare Important Message give by:     If discussed at [place] of Stay Meetings, dates discussed:    Additional Comments:  Alvie Heidelberg, RN 08/31/2015, 8:50 AM

## 2015-08-31 NOTE — Progress Notes (Signed)
PROGRESS NOTE  Shawna [name] [id number] DOB: 1940-08-28 DOA: 08/30/2015 PCP: [name], MD  Summary: 24yow past medical history end-stage renal disease on hemodialysis Monday Wednesday Friday, presented to the emergency department with acute onset of cough, chills, fever and shortness of breath within the last 24 hours. Initial evaluation revealed acute influenza A and plans were made for observation.  Assessment/Plan: 1. Influenza A with fever, SOB. No hypoxia. No evidence of sepsis. 2. ESRD on hemodialysis, mild hyperkalemia noted. 3. Anemia of CKD, stable.  4. PMH chronic bronchitis.  5. HLD. Continue statin     Doing well. Some fever but clinically appears very well and stable for discharge. Tamiflu after dialysis.  Discharge home after dialysis.  Code Status: Full DVT prophylaxis: SCDs Family Communication: Discussed with patient who understands and has no concerns at this time. Disposition Plan: Discharge home today  Murray Hodgkins, MD  [facility]  Pager [phone] If 7PM-7AM, please contact night-coverage at [email or web address], password [email or web address] 08/31/2015, 8:17 AM    Consultants:  None   Procedures:  None  Antibiotics:  None  HPI/Subjective: Doing well. Continues to have coughing. Breathing fine.  Objective: Filed Vitals:   08/30/15 2338 08/31/15 0132 08/31/15 0205 08/31/15 0557  BP: 128/48 148/59  178/77  Pulse: 95 96  104  Temp: 101.3 F (38.5 C) 99.6 F (37.6 C)  102 F (38.9 C)  TempSrc: Oral Oral  Oral  Resp: 18 16  17   Height:      Weight:      SpO2: 98% 100% 97% 98%   No intake or output data in the 24 hours ending 08/31/15 0817   Filed Weights   08/30/15 0927 08/30/15 1743  Weight: 62.143 kg (137 lb) 62.5 kg (137 lb 12.6 oz)    Exam:  Febrile temp 102.  General:  Appears calm and comfortable Cardiovascular: RRR, no m/r/g. No LE edema. Respiratory: CTA bilaterally, no w/r/r. Normal respiratory effort. Psychiatric: grossly  normal mood and affect, speech fluent and appropriate Neurologic: grossly non-focal.  New data reviewed:  Potassium 5.8. Remainder of BMP consistent with ESRD   Pertinent data since admission:  Positive Influenza A   Pending data:    Scheduled Meds: . albuterol  2.5 mg Nebulization Q6H  . aspirin EC  81 mg Oral Daily  . cinacalcet  30 mg Oral Q supper  . cloNIDine  0.3 mg Oral BID  . fluticasone  2 spray Each Nare Daily  . hydrALAZINE  100 mg Oral TID  . isosorbide dinitrate  30 mg Oral TID  . labetalol  300 mg Oral BID  . lanthanum  1,000 mg Oral TID PC  . loratadine  10 mg Oral Daily  . oseltamivir  30 mg Oral Q M,W,F-HD  . simvastatin  20 mg Oral QHS  . sodium chloride flush  3 mL Intravenous Q12H   Continuous Infusions:   Principal Problem:   Influenza A Active Problems:   ESRD on hemodialysis ([place])   Anemia of chronic kidney failure    By signing my name below, I, Rennis Harding attest that this documentation has been prepared under the direction and in the presence of Murray Hodgkins, MD Electronically signed: Rennis Harding  08/31/2015 9:49AM   I personally performed the services described in this documentation. All medical record entries made by the scribe were at my direction. I have reviewed the chart and agree that the record reflects my personal performance and is accurate and complete. Murray Hodgkins, MD

## 2015-08-31 NOTE — Progress Notes (Signed)
SATURATION QUALIFICATIONS: (This note is used to comply with regulatory documentation for home oxygen)  Patient Saturations on Room Air at Rest =98%  Patient Saturations on Room Air while Ambulating = 100%  Patient Saturations on Room Air after Ambulating = 98%

## 2015-08-31 NOTE — Progress Notes (Signed)
RN entered room to find patient extremely difficult to arouse and an oral temp of 103.4.  Tylenol suppository given per PRN order.  VSS otherwise.  Dr. Sarajane Jews paged and made aware.  Pt now noted to only respond to pain at times.  Dr. Sarajane Jews at bedside.  Canceled discharge order.  New orders placed and will follow.  Pt to transfer to stepdown unit per MD order.

## 2015-09-01 DIAGNOSIS — I2510 Atherosclerotic heart disease of native coronary artery without angina pectoris: Secondary | ICD-10-CM | POA: Diagnosis not present

## 2015-09-01 DIAGNOSIS — G9340 Encephalopathy, unspecified: Secondary | ICD-10-CM | POA: Diagnosis not present

## 2015-09-01 DIAGNOSIS — N186 End stage renal disease: Secondary | ICD-10-CM | POA: Diagnosis not present

## 2015-09-01 DIAGNOSIS — J111 Influenza due to unidentified influenza virus with other respiratory manifestations: Secondary | ICD-10-CM | POA: Diagnosis not present

## 2015-09-01 DIAGNOSIS — D631 Anemia in chronic kidney disease: Secondary | ICD-10-CM | POA: Diagnosis not present

## 2015-09-01 DIAGNOSIS — N185 Chronic kidney disease, stage 5: Secondary | ICD-10-CM | POA: Diagnosis not present

## 2015-09-01 DIAGNOSIS — J101 Influenza due to other identified influenza virus with other respiratory manifestations: Secondary | ICD-10-CM | POA: Diagnosis not present

## 2015-09-01 DIAGNOSIS — I10 Essential (primary) hypertension: Secondary | ICD-10-CM | POA: Diagnosis not present

## 2015-09-01 DIAGNOSIS — Z992 Dependence on renal dialysis: Secondary | ICD-10-CM | POA: Diagnosis not present

## 2015-09-01 LAB — CBC
HEMATOCRIT: 24.6 % — AB (ref 36.0–46.0)
HEMOGLOBIN: 7.9 g/dL — AB (ref 12.0–15.0)
MCH: 32.2 pg (ref 26.0–34.0)
MCHC: 32.1 g/dL (ref 30.0–36.0)
MCV: 100.4 fL — AB (ref 78.0–100.0)
Platelets: 172 10*3/uL (ref 150–400)
RBC: 2.45 MIL/uL — ABNORMAL LOW (ref 3.87–5.11)
RDW: 16.3 % — ABNORMAL HIGH (ref 11.5–15.5)
WBC: 7.3 10*3/uL (ref 4.0–10.5)

## 2015-09-01 LAB — BASIC METABOLIC PANEL
ANION GAP: 17 — AB (ref 5–15)
BUN: 46 mg/dL — ABNORMAL HIGH (ref 6–20)
CHLORIDE: 95 mmol/L — AB (ref 101–111)
CO2: 26 mmol/L (ref 22–32)
Calcium: 8.5 mg/dL — ABNORMAL LOW (ref 8.9–10.3)
Creatinine, Ser: 8.1 mg/dL — ABNORMAL HIGH (ref 0.44–1.00)
GFR calc Af Amer: 5 mL/min — ABNORMAL LOW (ref >60)
GFR, EST NON AFRICAN AMERICAN: 4 mL/min — AB (ref >60)
Glucose, Bld: 85 mg/dL (ref 65–99)
POTASSIUM: 4.8 mmol/L (ref 3.5–5.1)
SODIUM: 138 mmol/L (ref 135–145)

## 2015-09-01 LAB — PHOSPHORUS: PHOSPHORUS: 7 mg/dL — AB (ref 2.5–4.6)

## 2015-09-01 MED ORDER — LANTHANUM CARBONATE 500 MG PO CHEW
2000.0000 mg | CHEWABLE_TABLET | Freq: Three times a day (TID) | ORAL | Status: DC
  Administered 2015-09-01 – 2015-09-03 (×6): 2000 mg via ORAL
  Filled 2015-09-01 (×11): qty 4

## 2015-09-01 NOTE — Progress Notes (Signed)
Pt is now A&O x 4 and carries on appropriate conversion and is calm and polite. Pt reoriented to situation and time location and comprehended appropriately.

## 2015-09-01 NOTE — Progress Notes (Signed)
Patient is now easily aroused and well oriented. CT scan of her head neg for acute intracranial abnormalities; CXR with cardiomegaly And increase in interstitial opacity, likely edema given rapid onset. BP has been soft; but given findings on CXR and recent HD, will hold on IVF's. Afebrile. Will monitor.  Barton Dubois [id number]

## 2015-09-01 NOTE — Progress Notes (Signed)
Spoke with physician in reference to BP and medications. Agreement to hold BP meds due to borderline hypOtension No fluid bolus due to HD and previous overload. No further.

## 2015-09-01 NOTE — Progress Notes (Signed)
PROGRESS NOTE  ZAMIYAH SHU [id number] DOB: 12/01/1940 DOA: 08/30/2015 PCP: [name], MD  Summary: 1yow past medical history end-stage renal disease on hemodialysis Monday Wednesday Friday, presented to the emergency department with acute onset of cough, chills, fever and shortness of breath within the last 24 hours. Initial evaluation revealed acute influenza A and plans were made for observation.  Assessment/Plan: 1. Acute encephalopathy, resolved. Etiology obscure but may be simply secondary to fever, post dialysis hypotension, influenza. CT head negative. Exam nonfocal, no evidence of neurologic event. Seizure in differential. Occurred in the context of fever. Only possible medication was Benadryl administered approximately 4 hours prior to change condition. No narcotics or benzodiazepines. 2.  Influenza A with fever. Appears stable. 3. End-stage renal disease on hemodialysis. Stable. 4. Essential hypertension. Usually hypertensive by history. Systolic blood pressure [id number] 20. Parameters of medications, discussed with nursing. 5. Anemia of chronic kidney disease, stable  6.  chronic bronchitis    Much improved. Acute encephalopathy resolved. Exam non focal. Etiology of AMS unclear  Continue to monitor.  Hold antihypertensives. Follow up EEG  Advance diet.  Code Status: Full DVT prophylaxis: SCDs Family Communication: Discussed with patient who understands and has no concerns at this time. Disposition Plan:possibly home -2 days   Murray Hodgkins, MD  [facility]  Pager [phone] If 7PM-7AM, please contact night-coverage at [email or web address], password [facility] 09/01/2015, 6:41 AM    Consultants:  None   Procedures:  None  Antibiotics:  None  HPI/Subjective: Feeling woozy. Denies nausea, coughing, numbness, pain, trouble breathing. A little difficulty swallowing. She does not remember much after HD. Denies history of seizure. She reports in the past that when  her blood pressure gets low she passes out. 120 is low for her.   Per RN woke up overnight alert and oriented. BP low.   Objective: Filed Vitals:   09/01/15 0200 09/01/15 0300 09/01/15 0400 09/01/15 0500  BP: 96/47 106/43 110/53 125/54  Pulse: 82 81 82 81  Temp:      TempSrc:      Resp: 15 21 18 19   Height:      Weight:      SpO2: 99% 99% 99% 99%    Intake/Output Summary (Last 24 hours) at 09/01/15 0641 Last data filed at 08/31/15 0943  Gross per 24 hour  Intake      3 ml  Output      0 ml  Net      3 ml     Filed Weights   08/30/15 1743 08/31/15 1105 08/31/15 2004  Weight: 62.5 kg (137 lb 12.6 oz) 62.3 kg (137 lb 5.6 oz) 60.4 kg (133 lb 2.5 oz)    Exam:  Afebrile, hypotensive, not hypoxic  General:  Appears calm and comfortable Eyes: PERRL, normal lids, irises & ENT: grossly normal hearing, lips & tongue Cardiovascular: RRR, no m/r/g. No LE edema. Telemetry: SR, no arrhythmias  Respiratory: CTA bilaterally, no w/r/r. Normal respiratory effort. Abdomen: soft, ntnd Skin: no rash or induration noted Musculoskeletal: grossly normal tone BUE/BLE. Sensations intact. Psychiatric: grossly normal mood and affect, speech fluent and appropriate. Oriented to name, place, month, year and reason for hospitalization.  Neurologic: grossly non-focal. CN intact, no pronator drift. No pass pointing.   New data reviewed:  Potassium now normal   BMP consistent with ESRD  Lactic acid WNL  No leukocytosis   Hgb 7.9, stable   CXR with mild edema.  CT Head negative.     Pertinent data since  admission:  Positive Influenza A   Pending data:  BC  Scheduled Meds: . albuterol  2.5 mg Nebulization Q6H  . aspirin EC  81 mg Oral Daily  . cinacalcet  30 mg Oral Q supper  . cloNIDine  0.3 mg Oral BID  . epoetin (EPOGEN/PROCRIT) injection  10,000 Units Intravenous Q M,W,F-HD  . fluticasone  2 spray Each Nare Daily  . hydrALAZINE  100 mg Oral TID  . isosorbide dinitrate  30  mg Oral TID  . labetalol  300 mg Oral BID  . lanthanum  1,000 mg Oral TID PC  . loratadine  10 mg Oral Daily  . oseltamivir  30 mg Oral Q M,W,F-HD  . simvastatin  20 mg Oral QHS  . sodium chloride flush  3 mL Intravenous Q12H   Continuous Infusions:   Principal Problem:   Influenza A Active Problems:   ESRD on hemodialysis ([place])   Anemia of chronic kidney failure    By signing my name below, I, Rennis Harding attest that this documentation has been prepared under the direction and in the presence of Murray Hodgkins, MD Electronically signed: Rennis Harding  09/01/2015 8:38am   I personally performed the services described in this documentation. All medical record entries made by the scribe were at my direction. I have reviewed the chart and agree that the record reflects my personal performance and is accurate and complete. Murray Hodgkins, MD

## 2015-09-01 NOTE — Progress Notes (Signed)
Subjective: Yesterday's episode of altered mental status noted. Patient presently seems to be alert. She offers no complaints. Patient doesn't remember what happened and why she ended up in a different room. Presently she denies any cough or difficulty breathing.   Objective: Vital signs in last 24 hours: Temp:  [97.8 F (36.6 C)-103.4 F (39.7 C)] 98.2 F (36.8 C) (02/21 0728) Pulse Rate:  [81-94] 81 (02/21 0500) Resp:  [15-24] 19 (02/21 0500) BP: (88-177)/(36-91) 125/54 mmHg (02/21 0500) SpO2:  [92 %-100 %] 99 % (02/21 0723) FiO2 (%):  [28 %] 28 % (02/20 2100) Weight:  [133 lb 2.5 oz (60.4 kg)-137 lb 5.6 oz (62.3 kg)] 133 lb 2.5 oz (60.4 kg) (02/21 0500)  Intake/Output from previous day: 02/20 0701 - 02/21 0700 In: 3 [I.V.:3] Out: -  Intake/Output this shift:     Recent Labs  08/30/15 1025 08/31/15 0647 08/31/15 1845 09/01/15 0444  HGB 8.7* 7.5* 8.2* 7.9*    Recent Labs  08/31/15 1845 09/01/15 0444  WBC 8.9 7.3  RBC 2.52* 2.45*  HCT 25.2* 24.6*  PLT 189 172    Recent Labs  08/31/15 1845 09/01/15 0444  NA 137 138  K 4.6 4.8  CL 95* 95*  CO2 26 26  BUN 30* 46*  CREATININE 6.71* 8.10*  GLUCOSE 110* 85  CALCIUM 8.8* 8.5*   No results for input(s): LABPT, INR in the last 72 hours.  Generally patient is alert and in no apparent distress Chest: She has some expiratory wheezing Heart exam regular rate and rhythm no murmur Extremities no edema  Assessment/Plan: Problem #1 history of altered mental status i.e. being responsive yesterday. Etiology not clear but presently patient seems to be alert and oriented. Problem #2 hypertension: Her blood pressure presently seems to be low normal. Her blood pressure fluctuates so much as an outpatient difficult to control. Most of the problem is due to patient taking her medication at different times. Agree to hold her antihypertensive medication for now. Problem #3 end-stage renal disease: She is status post  hemodialysis yesterday. Her potassium is normal and she is asymptomatic. Problem #4 anemia: Her hemoglobin has declined Problem #5 fluid management: Presently she is asymptomatic and no sign of fluid overload Problem #6 history of influenza a infection Problem #7 metabolic bone disease: Her calcium is in range but her phosphorus is high. Presently patient is on Fosrenol 1000 mg by mouth 3 times a day with meals. Plan: 1]We'll make arrangement for patient to get dialysis tomorrow which is her regular dialysis day. 2] will increase Fosrenol 2000 milligrams by mouth 3 times a day with meals 3] which occurred basic metabolic panel and CBC in the morning  Jayra Choyce S 09/01/2015, 7:31 AM

## 2015-09-02 ENCOUNTER — Other Ambulatory Visit (HOSPITAL_COMMUNITY): Payer: Medicare HMO

## 2015-09-02 DIAGNOSIS — N186 End stage renal disease: Secondary | ICD-10-CM | POA: Diagnosis not present

## 2015-09-02 DIAGNOSIS — Z992 Dependence on renal dialysis: Secondary | ICD-10-CM | POA: Diagnosis not present

## 2015-09-02 DIAGNOSIS — J101 Influenza due to other identified influenza virus with other respiratory manifestations: Secondary | ICD-10-CM | POA: Diagnosis not present

## 2015-09-02 DIAGNOSIS — G9340 Encephalopathy, unspecified: Secondary | ICD-10-CM | POA: Diagnosis not present

## 2015-09-02 DIAGNOSIS — I120 Hypertensive chronic kidney disease with stage 5 chronic kidney disease or end stage renal disease: Secondary | ICD-10-CM | POA: Diagnosis not present

## 2015-09-02 DIAGNOSIS — I10 Essential (primary) hypertension: Secondary | ICD-10-CM | POA: Diagnosis not present

## 2015-09-02 DIAGNOSIS — I2510 Atherosclerotic heart disease of native coronary artery without angina pectoris: Secondary | ICD-10-CM | POA: Diagnosis not present

## 2015-09-02 DIAGNOSIS — K222 Esophageal obstruction: Secondary | ICD-10-CM | POA: Diagnosis not present

## 2015-09-02 DIAGNOSIS — J111 Influenza due to unidentified influenza virus with other respiratory manifestations: Secondary | ICD-10-CM | POA: Diagnosis not present

## 2015-09-02 LAB — CBC
HEMATOCRIT: 23.8 % — AB (ref 36.0–46.0)
HEMOGLOBIN: 7.8 g/dL — AB (ref 12.0–15.0)
MCH: 32.2 pg (ref 26.0–34.0)
MCHC: 32.8 g/dL (ref 30.0–36.0)
MCV: 98.3 fL (ref 78.0–100.0)
Platelets: 178 10*3/uL (ref 150–400)
RBC: 2.42 MIL/uL — ABNORMAL LOW (ref 3.87–5.11)
RDW: 16.2 % — AB (ref 11.5–15.5)
WBC: 6.6 10*3/uL (ref 4.0–10.5)

## 2015-09-02 LAB — BASIC METABOLIC PANEL
ANION GAP: 17 — AB (ref 5–15)
BUN: 79 mg/dL — AB (ref 6–20)
CHLORIDE: 91 mmol/L — AB (ref 101–111)
CO2: 25 mmol/L (ref 22–32)
Calcium: 8.1 mg/dL — ABNORMAL LOW (ref 8.9–10.3)
Creatinine, Ser: 10.65 mg/dL — ABNORMAL HIGH (ref 0.44–1.00)
GFR calc Af Amer: 4 mL/min — ABNORMAL LOW (ref >60)
GFR, EST NON AFRICAN AMERICAN: 3 mL/min — AB (ref >60)
GLUCOSE: 122 mg/dL — AB (ref 65–99)
POTASSIUM: 4.4 mmol/L (ref 3.5–5.1)
Sodium: 133 mmol/L — ABNORMAL LOW (ref 135–145)

## 2015-09-02 MED ORDER — ADENOSINE 6 MG/2ML IV SOLN
INTRAVENOUS | Status: AC
  Filled 2015-09-02: qty 2

## 2015-09-02 MED ORDER — SODIUM CHLORIDE 0.9 % IV SOLN: 100.0000 mL | INTRAVENOUS | Status: DC | PRN

## 2015-09-02 MED ORDER — EPOETIN ALFA 10000 UNIT/ML IJ SOLN
INTRAMUSCULAR | Status: AC
  Administered 2015-09-02: 10000 [IU] via INTRAVENOUS
  Filled 2015-09-02: qty 1

## 2015-09-02 MED ORDER — ADENOSINE 6 MG/2ML IV SOLN
6.0000 mg | INTRAVENOUS | Status: AC
  Administered 2015-09-02: 6 mg via INTRAVENOUS

## 2015-09-02 MED ORDER — SODIUM CHLORIDE 0.9 % IV BOLUS (SEPSIS)
250.0000 mL | Freq: Once | INTRAVENOUS | Status: AC
Start: 2015-09-02 — End: 2015-09-02
  Administered 2015-09-02: 250 mL via INTRAVENOUS

## 2015-09-02 NOTE — Progress Notes (Signed)
Called to bedside by RN. Patient was to be discharged today. While being provided with discharge instructions she mentioned to RN that she felt palpitations and felt a little dizzy. EKG was requested and she was found to be in SVT with heart rates in the 160s. She had just had dialysis today so consideration was given to excess fluid removal, 250 mL of saline were provided. This had no impact in her heart rate. She was given 6 mg of adenosine, for which I was present, she immediately converted back to sinus rhythm with rates in the low 100s. Repeat EKG shows lateral T-wave changes however these appear old when compared to EKG in November 2016. Will cancel discharge this evening and monitor on telemetry one more evening.  Domingo Mend, MD [facility] Pager: [phone]

## 2015-09-02 NOTE — Discharge Summary (Signed)
Physician Discharge Summary  Shawna [name] [id number] DOB: [birth year] DOA: 08/30/2015  PCP: Shawna Percy, MD  Admit date: 08/30/2015 Discharge date: 09/02/2015  Time spent: 45 minutes  Recommendations for Outpatient Follow-up:  -We'll be discharged home today. -Advised to follow-up with primary care provider in 2 weeks.   Discharge Diagnoses:  Principal Problem:   Influenza A Active Problems:   ESRD on hemodialysis ([place])   Anemia of chronic kidney failure   Discharge Condition: Stable and improved  Filed Weights   08/31/15 2004 09/01/15 0500 09/02/15 0900  Weight: 60.4 kg (133 lb 2.5 oz) 60.4 kg (133 lb 2.5 oz) 61.1 kg (134 lb 11.2 oz)    History of present illness:  As per Dr. Sarajane [name] on 2/19: 64yow past medical history end-stage renal disease on hemodialysis Monday Wednesday Friday, presented to the emergency department with acute onset of cough, chills, fever and shortness of breath within the last 24 hours. Initial evaluation revealed acute influenza A and plans were made for observation.  Patient was recently treated for sinusitis with Keflex and nearly completed this prescription. Last evening she developed significant coughing to the point where she difficulty sleeping. She developed some shortness of breath with this as well, fever and chills. She had some nausea and abdominal pain this morning. Abdominal pain is resolved. She does have some chest pain earlier today, resolved. [name] this morning, no specific aggravating or alleviating factors. She did get influenza vaccine this year. [name], last session 2/17. She reports them and left to much fluid. Her dry weight is 61.2. However when she left the facility her weight was 62.  [name], hypertensive, tachycardic, no hypoxia Pertinent labs: BMP consistent with end-stage renal disease. Hepatic function panel unremarkable. Hemoglobin 8.7.  Lactic acid 2.01. Influenza A positive. Imaging: CXR independently reviewed: NAD  Hospital Course:   Influenza A with fever -Appear stable, continue Tamiflu for 3.5 days following discharge.  Acute encephalopathy  -resolved, etiology unclear, may be simply secondary to fever or postdialysis hypotension or influenza. -CT head is negative, neurological exam is nonfocal.  End-stage renal disease -On hemodialysis, will receive dialysis today prior to discharge.  Anemia of chronic disease -Due to end-stage renal disease. -Hemoglobin is 7.9, has been stable for the past 2 weeks, will need to be closely followed in the outpatient setting.  Hypertension -Fair control.   Procedures:  None    Consultations:  None  Discharge Instructions  Discharge Instructions    Activity as tolerated - No restrictions    Complete by:  As directed      Diet - low sodium heart healthy    Complete by:  As directed      Diet - low sodium heart healthy    Complete by:  As directed      Discharge instructions    Complete by:  As directed   Call your physician or seek immediate medical attention for shortness of breath, worsening fever, vomiting or worsening of condition.     Increase activity slowly    Complete by:  As directed             Medication List    STOP taking these medications        cephALEXin 500 MG capsule  Commonly known as:  KEFLEX      TAKE these medications        acetaminophen 325 MG tablet  Commonly known  as:  TYLENOL  Take 2 tablets (650 mg total) by mouth every 6 (six) hours as needed for mild pain (mild pain).     ALPRAZolam 0.5 MG tablet  Commonly known as:  XANAX  Take 1 tablet (0.5 mg total) by mouth 3 (three) times daily.     amLODipine 2.5 MG tablet  Commonly known as:  NORVASC  Take 1 tablet (2.5 mg total) by mouth daily.     aspirin EC 81 MG tablet  Take 81 mg by mouth daily.     benzonatate 100 MG capsule  Commonly known as:  TESSALON  Take 1  capsule (100 mg total) by mouth every 8 (eight) hours.     cloNIDine 0.3 MG tablet  Commonly known as:  CATAPRES  Take 1 tablet (0.3 mg total) by mouth 2 (two) times daily.     fluticasone 50 MCG/ACT nasal spray  Commonly known as:  FLONASE  Place 2 sprays into the nose daily.     folic acid-vitamin b complex-vitamin c-selenium-zinc 3 MG Tabs tablet  Take 1 tablet by mouth daily.     hydrALAZINE 50 MG tablet  Commonly known as:  APRESOLINE  Take 2 tablets (100 mg total) by mouth 3 (three) times daily.     isosorbide dinitrate 30 MG tablet  Commonly known as:  ISORDIL  Take 1 tablet (30 mg total) by mouth 3 (three) times daily.     labetalol 200 MG tablet  Commonly known as:  NORMODYNE  Take 1.5 tablets (300 mg total) by mouth 2 (two) times daily.     lanthanum 1000 MG chewable tablet  Commonly known as:  FOSRENOL  Chew 1,000 mg by mouth 3 (three) times daily after meals.     loratadine 10 MG tablet  Commonly known as:  CLARITIN  Take 10 mg by mouth daily.     nitroGLYCERIN 0.4 MG SL tablet  Commonly known as:  NITROSTAT  Place 1 tablet (0.4 mg total) under the tongue every 5 (five) minutes x 3 doses as needed for chest pain.     omeprazole 20 MG capsule  Commonly known as:  PRILOSEC  Take 1 capsule (20 mg total) by mouth daily.     ondansetron 4 MG disintegrating tablet  Commonly known as:  ZOFRAN-ODT  Take 4 mg by mouth every 8 (eight) hours as needed. nausea     oseltamivir 30 MG capsule  Commonly known as:  TAMIFLU  Take 1 capsule (30 mg total) by mouth every Monday, Wednesday, and Friday with hemodialysis.     polyethylene glycol packet  Commonly known as:  MIRALAX / GLYCOLAX  Take 17 g by mouth daily.     PROAIR HFA 108 (90 Base) MCG/ACT inhaler  Generic drug:  albuterol  Inhale 1 puff into the lungs every 6 (six) hours as needed for wheezing or shortness of breath.     SENSIPAR 30 MG tablet  Generic drug:  cinacalcet  Take 30 mg by mouth daily with  supper.     simvastatin 20 MG tablet  Commonly known as:  ZOCOR  TAKE ONE TABLET BY MOUTH AT BEDTIME     tetrahydrozoline 0.05 % ophthalmic solution  Place 1 drop into both eyes 2 (two) times daily as needed (irritation).       Allergies  Allergen Reactions  . Aspirin Other (See Comments)    Mess up her stomach; "makes my bowels have blood in them". Takes 81 mg EC Aspirin   .  Contrast Media [Iodinated Diagnostic Agents] Itching  . Iron Itching and Other (See Comments)    "they gave me iron in dialysis; had to give me Benadryl cause I had to have the iron" (05/02/2012)  . Macrodantin [Nitrofurantoin Macrocrystal] Other (See Comments)    "broke me out in big old knots all over my body; had to go to ER"  . Penicillins Other (See Comments)    "makes me real weak when I take it; like I'll pass out"  . Plavix [Clopidogrel Bisulfate] Rash  . Amlodipine Swelling    Leg swelling  . Bactrim [Sulfamethoxazole-Trimethoprim] Rash  . Dexilant [Dexlansoprazole] Other (See Comments)    Upset stomach  . Morphine And Related Itching    Itching in feet  . Nitrofurantoin Hives  . Prilosec [Omeprazole] Other (See Comments)    "back spasms"  . Sulfa Antibiotics Rash  . Venofer [Ferric Oxide] Itching    Patient reports using Benadryl prior to doses as [facility]  . Levaquin [Levofloxacin In D5w] Rash  . Protonix [Pantoprazole Sodium] Rash       Follow-up Information    Follow up with Shawna Percy, MD. Call today.   Specialty:  Family Medicine   Why:  recheck in 3 days if no improvement   Contact information:   [address] [place] [address] [phone]        The results of significant diagnostics from this hospitalization (including imaging, microbiology, ancillary and laboratory) are listed below for reference.    Significant Diagnostic Studies: Dg Chest 2 View  08/30/2015  CLINICAL DATA:  Insert epic study EXAM: CHEST  2 VIEW COMPARISON:  06/07/2015 FINDINGS: Status post  median sternotomy and CABG. The heart is mildly enlarged. There are no focal consolidations pleural effusions. No pulmonary edema. Contrast is identified within nondilated loops of colon. IMPRESSION: 1. Mild cardiomegaly. 2.  No evidence for acute cardiopulmonary abnormality. 3. Retained contrast. Electronically Signed   By: Nolon Nations M.D.   On: 08/30/2015 10:19   Ct Head Wo Contrast  08/31/2015  CLINICAL DATA:  Fatigue and weakness for 2 days. EXAM: CT HEAD WITHOUT CONTRAST TECHNIQUE: Contiguous axial images were obtained from the base of the skull through the vertex without intravenous contrast. COMPARISON:  MRI from 03/03/2015.  Head CT from 12/16/2012. FINDINGS: There is no evidence for acute hemorrhage, hydrocephalus, mass lesion, or abnormal extra-axial fluid collection. No definite CT evidence for acute infarction. Patchy low attenuation in the deep hemispheric and periventricular white matter is nonspecific, but likely reflects chronic microvascular ischemic demyelination. Lacunar infarct is seen in the left basal ganglia. Air-fluid level is identified in the left sphenoid sinus. The remaining visualized paranasal sinuses are clear. No evidence for fluid in the mastoid air cells. IMPRESSION: 1. No acute intracranial abnormality. 2. Chronic small vessel white matter ischemic disease. 3. Air-fluid level in the left sphenoid sinus. Imaging features are compatible with acute sinusitis. Electronically Signed   By: Misty Stanley M.D.   On: 08/31/2015 20:01   Dg Chest Port 1 View  08/31/2015  CLINICAL DATA:  Acute encephalopathy. EXAM: PORTABLE CHEST 1 VIEW COMPARISON:  08/30/2015 FINDINGS: 2016 hours. Interval increase in interstitial opacity. No focal airspace consolidation. No overt airspace pulmonary edema or pleural effusion. The cardio pericardial silhouette is enlarged. Patient is status post CABG. The visualized bony structures of the thorax are intact. IMPRESSION: Cardiomegaly with interval  increase in interstitial opacity, likely edema given the rapid onset. Electronically Signed   By: Randall Hiss  Tery Sanfilippo M.D.   On: 08/31/2015 20:39    Microbiology: Recent Results (from the past 240 hour(s))  MRSA PCR Screening     Status: None   Collection Time: 08/31/15 10:45 AM  Result Value Ref Range Status   MRSA by PCR NEGATIVE NEGATIVE Final    Comment:        The GeneXpert MRSA Assay (FDA approved for NASAL specimens only), is one component of a comprehensive MRSA colonization surveillance program. It is not intended to diagnose MRSA infection nor to guide or monitor treatment for MRSA infections.   Culture, blood (Routine X 2) w Reflex to ID Panel     Status: None (Preliminary result)   Collection Time: 08/31/15  6:45 PM  Result Value Ref Range Status   Specimen Description BLOOD RIGHT ARM  Final   Special Requests BOTTLES DRAWN AEROBIC AND ANAEROBIC 6CC  Final   Culture NO GROWTH < 24 HOURS  Final   Report Status PENDING  Incomplete  Culture, blood (Routine X 2) w Reflex to ID Panel     Status: None (Preliminary result)   Collection Time: 08/31/15  6:50 PM  Result Value Ref Range Status   Specimen Description BLOOD RIGHT HAND  Final   Special Requests BOTTLES DRAWN AEROBIC ONLY 6CC  Final   Culture NO GROWTH < 24 HOURS  Final   Report Status PENDING  Incomplete     Labs: Basic Metabolic Panel:  Recent Labs Lab 08/30/15 1025 08/31/15 0647 08/31/15 1845 09/01/15 0444 09/02/15 0545  NA 143 141 137 138 133*  K 5.1 5.8* 4.6 4.8 4.4  CL 101 101 95* 95* 91*  CO2 25 22 26 26 25   GLUCOSE 107* 85 110* 85 122*  BUN 53* 73* 30* 46* 79*  CREATININE 10.69* 13.71* 6.71* 8.10* 10.65*  CALCIUM 8.8* 8.3* 8.8* 8.5* 8.1*  MG  --   --  1.6*  --   --   PHOS  --   --   --  7.0*  --    Liver Function Tests:  Recent Labs Lab 08/30/15 1025  AST 26  ALT 17  ALKPHOS 130*  BILITOT 0.4  PROT 6.9  ALBUMIN 4.0   No results for input(s): LIPASE, AMYLASE in the last 168  hours. No results for input(s): AMMONIA in the last 168 hours. CBC:  Recent Labs Lab 08/30/15 1025 08/31/15 0647 08/31/15 1845 09/01/15 0444 09/02/15 0545  WBC 11.0* 9.3 8.9 7.3 6.6  NEUTROABS 9.4*  --   --   --   --   HGB 8.7* 7.5* 8.2* 7.9* 7.8*  HCT 26.9* 23.4* 25.2* 24.6* 23.8*  MCV 101.1* 100.9* 100.0 100.4* 98.3  PLT 217 175 189 172 178   Cardiac Enzymes: No results for input(s): CKTOTAL, CKMB, CKMBINDEX, TROPONINI in the last 168 hours. BNP: BNP (last 3 results)  Recent Labs  05/04/15 0518  BNP 1041.0*    ProBNP (last 3 results) No results for input(s): PROBNP in the last 8760 hours.  CBG:  Recent Labs Lab 08/31/15 1825  GLUCAP 100*       Signed:  Lelon Frohlich  [facility] Pager: [phone] 09/02/2015, 10:11 AM

## 2015-09-02 NOTE — Progress Notes (Signed)
Pt started c/o of head hurting and heart beating fast, pt hooked up to monitor and is reading heart rate 150's Sinus Tach. MD paged

## 2015-09-02 NOTE — Care Management Note (Signed)
Case Management Note  Patient Details  Name: Shawna [name] MRN: [id number] Date of Birth: 1940-10-09  Expected Discharge Date:                  Expected Discharge Plan:  Home/Self Care  In-House Referral:     Discharge planning Services  CM Consult  Post Acute Care Choice:    Choice offered to:     DME Arranged:    DME Agency:     HH Arranged:    [place] Agency:     Status of Service:  Completed, signed off  Medicare Important Message Given:    Date Medicare IM Given:    Medicare IM give by:    Date Additional Medicare IM Given:    Additional Medicare Important Message give by:     If discussed at [place] of Stay Meetings, dates discussed:    Additional Comments: Pt discharging home today. No CM needs.  Sherald Barge, RN 09/02/2015, 11:29 AM

## 2015-09-02 NOTE — Progress Notes (Signed)
Shawna [name]  MRN: [id number]  DOB/AGE: [birth year] 75 y.o.  Primary Care Physician:HOWARD, Lennette Bihari, MD  Admit date: 08/30/2015  Chief Complaint:  Chief Complaint  Patient presents with  . Cough    [name]-Pt presented on  08/30/2015 with  Chief Complaint  Patient presents with  . Cough  .    Pt today feels much better.Pt says my cough is better.   Meds . aspirin EC  81 mg Oral Daily  . cinacalcet  30 mg Oral Q supper  . cloNIDine  0.3 mg Oral BID  . epoetin (EPOGEN/PROCRIT) injection  10,000 Units Intravenous Q M,W,F-HD  . fluticasone  2 spray Each Nare Daily  . isosorbide dinitrate  30 mg Oral TID  . labetalol  300 mg Oral BID  . lanthanum  2,000 mg Oral TID PC  . loratadine  10 mg Oral Daily  . oseltamivir  30 mg Oral Q M,W,F-HD  . simvastatin  20 mg Oral QHS  . sodium chloride flush  3 mL Intravenous Q12H      Physical Exam: Vital signs in last 24 hours: Temp:  [98.3 F (36.8 C)-98.6 F (37 C)] 98.4 F (36.9 C) (02/22 0800) Pulse Rate:  [73-101] 75 (02/22 1045) Resp:  [7-25] 13 (02/22 1045) BP: (87-152)/(38-104) 143/44 mmHg (02/22 1045) SpO2:  [90 %-100 %] 100 % (02/22 0905) Weight:  [134 lb 11.2 oz (61.1 kg)] 134 lb 11.2 oz (61.1 kg) (02/22 0900) Weight change:  Last BM Date: 09/01/15  Intake/Output from previous day:       Physical Exam: General- pt is awake follows commands Resp- No acute REsp distress, wheezing present CVS- S1S2 regular in rate and rhythm GIT- BS+, soft, NT, ND EXT- NO LE Edema, NO Cyanosis Access -left AVF +  Lab Results: CBC  Recent Labs  09/01/15 0444 09/02/15 0545  WBC 7.3 6.6  HGB 7.9* 7.8*  HCT 24.6* 23.8*  PLT 172 178    BMET  Recent Labs  09/01/15 0444 09/02/15 0545  NA 138 133*  K 4.8 4.4  CL 95* 91*  CO2 26 25  GLUCOSE 85 122*  BUN 46* 79*  CREATININE 8.10* 10.65*  CALCIUM 8.5* 8.1*    MICRO Recent Results (from the past 240 hour([name]))  MRSA PCR Screening     Status: None   Collection Time:  08/31/15 10:45 AM  Result Value Ref Range Status   MRSA by PCR NEGATIVE NEGATIVE Final    Comment:        The GeneXpert MRSA Assay (FDA approved for NASAL specimens only), is one component of a comprehensive MRSA colonization surveillance program. It is not intended to diagnose MRSA infection nor to guide or monitor treatment for MRSA infections.   Culture, blood (Routine X 2) w Reflex to ID Panel     Status: None (Preliminary result)   Collection Time: 08/31/15  6:45 PM  Result Value Ref Range Status   Specimen Description BLOOD RIGHT ARM  Final   Special Requests BOTTLES DRAWN AEROBIC AND ANAEROBIC 6CC  Final   Culture NO GROWTH 2 DAYS  Final   Report Status PENDING  Incomplete  Culture, blood (Routine X 2) w Reflex to ID Panel     Status: None (Preliminary result)   Collection Time: 08/31/15  6:50 PM  Result Value Ref Range Status   Specimen Description BLOOD RIGHT HAND  Final   Special Requests BOTTLES DRAWN AEROBIC ONLY [place]  Final   Culture NO GROWTH 2 DAYS  Final  Report Status PENDING  Incomplete      Lab Results  Component Value Date   PTH 709.7* 05/29/2013   CALCIUM 8.1* 09/02/2015   CAION 1.08* 08/24/2014   PHOS 7.0* 09/01/2015       Impression: 1)Renal ESRD on HD               Pt is on MWF schedule               Pt was dialyzed yesterday               Will dialyze today as well.  2)HTN  Medication-  On Alpha and beta Blockers. On Vasodilators. On [employer].  3)Anemia in ESRD the goal for  HGb is 9--11. HGb not at goal Will keep on epo during HD   4)CKD Mineral-Bone Disorder PTH elevated . Secondary Hyperparathyroidism present.     On sensipar Phosphorus at goal.     On Binders Calcium at goal.  5)CNs-Admitted with Altered mental status Primary MD following  6)Electrolytes Normokalemic  Hyponatremic    Sec to ESRD      Inability to get rid of free water  7)Acid base Co2 at goal  8)ID-admitted with  Influenza A   Stable on anti viral meds  Plan:  Will dialyze today Will keep on epo   Addendum Pt seen on HD. Pt tolerating tx well.  Shawna [name] 09/02/2015, 11:01 AM

## 2015-09-02 NOTE — Procedures (Signed)
   HEMODIALYSIS TREATMENT NOTE:  4 hour heparin-free dialysis completed via left upper arm AVF (16g ante/retrograde). Goal NOT met: BP unable to tolerate UF of 2.5 liters as ordered. Ultrafiltration was interrupted x 45 min due to hypotension (SBP 85-100).  Pt at EDW prior to starting tx.  Dr. Theador Hawthorne notified.  Net UF 1.5 liters.  All blood was returned and hemostasis was achieved within 15 minutes. Report given to Deno Etienne, RN.  Rockwell Alexandria, RN, CDN

## 2015-09-03 ENCOUNTER — Observation Stay (HOSPITAL_COMMUNITY)
Admit: 2015-09-03 | Discharge: 2015-09-03 | Disposition: A | Discharge status: Home / Self Care | Payer: Medicare HMO | Attending: Family Medicine | Admitting: Family Medicine

## 2015-09-03 DIAGNOSIS — G9340 Encephalopathy, unspecified: Secondary | ICD-10-CM | POA: Diagnosis not present

## 2015-09-03 DIAGNOSIS — J101 Influenza due to other identified influenza virus with other respiratory manifestations: Secondary | ICD-10-CM | POA: Diagnosis not present

## 2015-09-03 DIAGNOSIS — N186 End stage renal disease: Secondary | ICD-10-CM | POA: Diagnosis not present

## 2015-09-03 DIAGNOSIS — Z992 Dependence on renal dialysis: Secondary | ICD-10-CM | POA: Diagnosis not present

## 2015-09-03 DIAGNOSIS — D631 Anemia in chronic kidney disease: Secondary | ICD-10-CM | POA: Diagnosis not present

## 2015-09-03 DIAGNOSIS — I10 Essential (primary) hypertension: Secondary | ICD-10-CM | POA: Diagnosis not present

## 2015-09-03 DIAGNOSIS — J111 Influenza due to unidentified influenza virus with other respiratory manifestations: Secondary | ICD-10-CM | POA: Diagnosis not present

## 2015-09-03 LAB — GLUCOSE, CAPILLARY: Glucose-Capillary: 174 mg/dL — ABNORMAL HIGH (ref 65–99)

## 2015-09-03 MED ORDER — BENZONATATE 100 MG PO CAPS: 100.0000 mg | ORAL_CAPSULE | Freq: Three times a day (TID) | ORAL | Status: DC

## 2015-09-03 NOTE — Progress Notes (Signed)
Patient alert and oriented, independent, VSS, pt. Tolerating diet well. No complaints of pain or nausea. Pt. Had IV removed tip intact. Pt. Had prescriptions given. Pt. Voiced understanding of discharge instructions with no further questions. Pt. Waiting in room for husband to transport home.

## 2015-09-03 NOTE — Progress Notes (Signed)
This writer responded to bed alarm and helped patient OOB to the bathroom where she had a large loose stool. Patient sat on toilet for several minutes and began to moan and refer to the bathroom as her bedroom at home. Patient noted diaphoretic, drooling and unable to sit up. Assistance requested with stedy lift x 3 nurses to assist patient back to bed. BP-135/44, P-87, O2-97%, T-99.7, R-20, CBG-174. MD notified.

## 2015-09-03 NOTE — Progress Notes (Signed)
EEG cancelled per Erline Hau, MD. EEG not completed as previous note error

## 2015-09-03 NOTE — Progress Notes (Signed)
Subjective: Patient complains of not feeling good. She says she's feeling weak or tired. According to patient she had a bowel movement and this was dark and she was wondering whether she has bleeding or not. She denies any nausea or vomiting but her appetite is poor.  Objective: Vital signs in last 24 hours: Temp:  [98 F (36.7 C)-99.7 F (37.6 C)] 98.8 F (37.1 C) (02/23 0449) Pulse Rate:  [71-160] 90 (02/23 0449) Resp:  [13-22] 20 (02/23 0449) BP: (87-171)/(44-105) 167/62 mmHg (02/23 0449) SpO2:  [85 %-100 %] 100 % (02/23 0449) Weight:  [134 lb 4.2 oz (60.9 kg)] 134 lb 4.2 oz (60.9 kg) (02/23 0449)  Intake/Output from previous day: 02/22 0701 - 02/23 0700 In: -  Out: 1509 [Urine:2; Stool:2] Intake/Output this shift:     Recent Labs  08/31/15 1845 09/01/15 0444 09/02/15 0545  HGB 8.2* 7.9* 7.8*    Recent Labs  09/01/15 0444 09/02/15 0545  WBC 7.3 6.6  RBC 2.45* 2.42*  HCT 24.6* 23.8*  PLT 172 178    Recent Labs  09/01/15 0444 09/02/15 0545  NA 138 133*  K 4.8 4.4  CL 95* 91*  CO2 26 25  BUN 46* 79*  CREATININE 8.10* 10.65*  GLUCOSE 85 122*  CALCIUM 8.5* 8.1*   No results for input(s): LABPT, INR in the last 72 hours.  Generally patient is alert and in no apparent distress Chest: She has some expiratory wheezing Heart exam regular rate and rhythm no murmur Extremities no edema  Assessment/Plan: Problem #1 history of altered mental status : Presently patient has returned to her baseline. Problem #2 hypertension: Her blood pressure is fluctuating but presently seems to be reasonably controlled. Problem #3 end-stage renal disease: She is status post hemodialysis yesterday. Her potassium is normal and she is asymptomatic. Problem #4 anemia: Her hemoglobin has declined. Etiology presently not clear. Patient states that she has dark stool. Patient has been followed by GI for some time. Problem #5 fluid management: Presently she is asymptomatic and no sign  of fluid overload Problem #6 history of influenza a infection Problem #7 metabolic bone disease: Her calcium is in range but her phosphorus is high. Patient presently on post renal. Plan: 1]We'll make arrangement for patient to get dialysis tomorrow which is her regular dialysis day. 2] will increase Fosrenol 2000 milligrams by mouth 3 times a day with meals 3] which occurred basic metabolic panel and CBC in the morning  Shelton Soler S 09/03/2015, 9:01 AM

## 2015-09-03 NOTE — Progress Notes (Signed)
[id number] Patient had an episode of bowel incontinence & c/o ABD cramping. Patient cleansed and vital signs obtained and WNL. Will continue to monitor.

## 2015-09-03 NOTE — Progress Notes (Signed)
EEG completed, results pending. 

## 2015-09-03 NOTE — Discharge Summary (Signed)
Please note that patient's discharge was held an extra day due to development of SVT following dialysis that promptly resolved with 6 mg of adenosine. She was kept in the hospital an extra night to ensure stability. Prior discharge medications and discharge diagnoses remain unchanged.  Domingo Mend, MD [facility] Pager: [phone]

## 2015-09-04 DIAGNOSIS — N186 End stage renal disease: Secondary | ICD-10-CM | POA: Diagnosis not present

## 2015-09-04 DIAGNOSIS — Z992 Dependence on renal dialysis: Secondary | ICD-10-CM | POA: Diagnosis not present

## 2015-09-06 LAB — CULTURE, BLOOD (ROUTINE X 2)
CULTURE: NO GROWTH
CULTURE: NO GROWTH

## 2015-09-07 ENCOUNTER — Encounter (HOSPITAL_COMMUNITY): Payer: Self-pay | Admitting: *Deleted

## 2015-09-07 ENCOUNTER — Telehealth (INDEPENDENT_AMBULATORY_CARE_PROVIDER_SITE_OTHER): Payer: Self-pay | Admitting: Internal Medicine

## 2015-09-07 ENCOUNTER — Emergency Department (HOSPITAL_COMMUNITY)
Admission: EM | Admit: 2015-09-07 | Discharge: 2015-09-07 | Disposition: A | Discharge status: Home / Self Care | Payer: Medicare HMO | Attending: Emergency Medicine | Admitting: Emergency Medicine

## 2015-09-07 DIAGNOSIS — Z880 Allergy status to penicillin: Secondary | ICD-10-CM | POA: Insufficient documentation

## 2015-09-07 DIAGNOSIS — Z7951 Long term (current) use of inhaled steroids: Secondary | ICD-10-CM | POA: Insufficient documentation

## 2015-09-07 DIAGNOSIS — Z8709 Personal history of other diseases of the respiratory system: Secondary | ICD-10-CM | POA: Diagnosis not present

## 2015-09-07 DIAGNOSIS — K625 Hemorrhage of anus and rectum: Secondary | ICD-10-CM | POA: Diagnosis present

## 2015-09-07 DIAGNOSIS — Z992 Dependence on renal dialysis: Secondary | ICD-10-CM | POA: Diagnosis not present

## 2015-09-07 DIAGNOSIS — D649 Anemia, unspecified: Secondary | ICD-10-CM | POA: Diagnosis not present

## 2015-09-07 DIAGNOSIS — Z8673 Personal history of transient ischemic attack (TIA), and cerebral infarction without residual deficits: Secondary | ICD-10-CM | POA: Insufficient documentation

## 2015-09-07 DIAGNOSIS — E7800 Pure hypercholesterolemia, unspecified: Secondary | ICD-10-CM | POA: Insufficient documentation

## 2015-09-07 DIAGNOSIS — F419 Anxiety disorder, unspecified: Secondary | ICD-10-CM | POA: Diagnosis not present

## 2015-09-07 DIAGNOSIS — Z8711 Personal history of peptic ulcer disease: Secondary | ICD-10-CM | POA: Insufficient documentation

## 2015-09-07 DIAGNOSIS — Z8543 Personal history of malignant neoplasm of ovary: Secondary | ICD-10-CM | POA: Diagnosis not present

## 2015-09-07 DIAGNOSIS — Z9889 Other specified postprocedural states: Secondary | ICD-10-CM | POA: Insufficient documentation

## 2015-09-07 DIAGNOSIS — N186 End stage renal disease: Secondary | ICD-10-CM | POA: Insufficient documentation

## 2015-09-07 DIAGNOSIS — I252 Old myocardial infarction: Secondary | ICD-10-CM | POA: Diagnosis not present

## 2015-09-07 DIAGNOSIS — Z8701 Personal history of pneumonia (recurrent): Secondary | ICD-10-CM | POA: Insufficient documentation

## 2015-09-07 DIAGNOSIS — K219 Gastro-esophageal reflux disease without esophagitis: Secondary | ICD-10-CM | POA: Insufficient documentation

## 2015-09-07 DIAGNOSIS — Z79899 Other long term (current) drug therapy: Secondary | ICD-10-CM | POA: Diagnosis not present

## 2015-09-07 DIAGNOSIS — K921 Melena: Secondary | ICD-10-CM | POA: Insufficient documentation

## 2015-09-07 DIAGNOSIS — I5032 Chronic diastolic (congestive) heart failure: Secondary | ICD-10-CM | POA: Insufficient documentation

## 2015-09-07 DIAGNOSIS — R69 Illness, unspecified: Secondary | ICD-10-CM | POA: Diagnosis not present

## 2015-09-07 DIAGNOSIS — I2510 Atherosclerotic heart disease of native coronary artery without angina pectoris: Secondary | ICD-10-CM | POA: Diagnosis not present

## 2015-09-07 DIAGNOSIS — R000 Tachycardia, unspecified: Secondary | ICD-10-CM | POA: Insufficient documentation

## 2015-09-07 DIAGNOSIS — Z85038 Personal history of other malignant neoplasm of large intestine: Secondary | ICD-10-CM | POA: Diagnosis not present

## 2015-09-07 DIAGNOSIS — I120 Hypertensive chronic kidney disease with stage 5 chronic kidney disease or end stage renal disease: Secondary | ICD-10-CM | POA: Diagnosis not present

## 2015-09-07 DIAGNOSIS — Z7982 Long term (current) use of aspirin: Secondary | ICD-10-CM | POA: Insufficient documentation

## 2015-09-07 LAB — PROTIME-INR
INR: 1.01 (ref 0.00–1.49)
Prothrombin Time: 13.5 seconds (ref 11.6–15.2)

## 2015-09-07 LAB — CBC
HCT: 23.4 % — ABNORMAL LOW (ref 36.0–46.0)
Hemoglobin: 7.6 g/dL — ABNORMAL LOW (ref 12.0–15.0)
MCH: 33 pg (ref 26.0–34.0)
MCHC: 32.5 g/dL (ref 30.0–36.0)
MCV: 101.7 fL — ABNORMAL HIGH (ref 78.0–100.0)
PLATELETS: 271 10*3/uL (ref 150–400)
RBC: 2.3 MIL/uL — ABNORMAL LOW (ref 3.87–5.11)
RDW: 16.9 % — AB (ref 11.5–15.5)
WBC: 6.4 10*3/uL (ref 4.0–10.5)

## 2015-09-07 LAB — COMPREHENSIVE METABOLIC PANEL
ALBUMIN: 3.4 g/dL — AB (ref 3.5–5.0)
ALK PHOS: 103 U/L (ref 38–126)
ALT: 16 U/L (ref 14–54)
AST: 23 U/L (ref 15–41)
Anion gap: 9 (ref 5–15)
BILIRUBIN TOTAL: 0.2 mg/dL — AB (ref 0.3–1.2)
BUN: 16 mg/dL (ref 6–20)
CALCIUM: 8.3 mg/dL — AB (ref 8.9–10.3)
CO2: 30 mmol/L (ref 22–32)
CREATININE: 5.69 mg/dL — AB (ref 0.44–1.00)
Chloride: 94 mmol/L — ABNORMAL LOW (ref 101–111)
GFR calc Af Amer: 8 mL/min — ABNORMAL LOW (ref >60)
GFR calc non Af Amer: 7 mL/min — ABNORMAL LOW (ref >60)
GLUCOSE: 88 mg/dL (ref 65–99)
Potassium: 3.5 mmol/L (ref 3.5–5.1)
SODIUM: 133 mmol/L — AB (ref 135–145)
TOTAL PROTEIN: 6.4 g/dL — AB (ref 6.5–8.1)

## 2015-09-07 LAB — TYPE AND SCREEN
ABO/RH(D): O POS
ANTIBODY SCREEN: NEGATIVE

## 2015-09-07 LAB — POC OCCULT BLOOD, ED: Fecal Occult Bld: NEGATIVE

## 2015-09-07 NOTE — ED Notes (Signed)
Received call from lab. Pink blood blank tube was hemolyzed. Phlebotomy on the way to re-collect.

## 2015-09-07 NOTE — Telephone Encounter (Signed)
Patient is having melena. Her last hemoglobin was 7.8 a few days ago. I advised her to go to the ED.

## 2015-09-07 NOTE — ED Notes (Signed)
Pt was advised to come here by GI office for low hemoglobin. Per patient she has been having dark stools. Pt admits to fatigue. Pt denies any pain at this time.

## 2015-09-07 NOTE — Telephone Encounter (Signed)
Shawna [name] left a message on Friday saying she still has blood in her stool. It's also very dark and she's experiencing stomach pain. She'd like a phone call regarding this and advice on what she needs to do.  Pt's ph# [phone]  Thank you.

## 2015-09-07 NOTE — ED Provider Notes (Signed)
CSN: [id number]     Arrival date & time 09/07/15  1600 History   First MD Initiated Contact with Patient 09/07/15 1613     Chief Complaint  Patient presents with  . Low Hemoglobin      (Consider location/radiation/quality/duration/timing/severity/associated sxs/prior Treatment) Patient is a 75 y.o. female presenting with hematochezia.  Rectal Bleeding Quality:  Maroon Amount:  Moderate Duration:  2 months Timing:  Constant Progression:  Worsening Chronicity:  New Context: not anal fissures and not anal penetration   Similar prior episodes: yes   Relieved by:  None tried Worsened by:  Nothing tried Ineffective treatments:  None tried Associated symptoms: no abdominal pain, no fever, no loss of consciousness and no vomiting     Past Medical History  Diagnosis Date  . Chronic bronchitis ([place])   . GERD (gastroesophageal reflux disease)   . PUD (peptic ulcer disease)   . History of lower GI bleeding   . Arthritis   . History of gout   . CAD (coronary artery disease)     a. 12/2011 NSTEMI/Cath/PCI LCX (2.25x14 Resolute DES) & D1 (2.25x22 Resolute DES);  b. 01/2012 Cath/PCI: LM 30, LAD 30p, 40-75m, D1 stent ok, 99 in sm branch of diag, LCX patent stent, OM1 20, RCA 95 ost (4.0x12 Promus DES), EF 55%;  c. 04/2012 Lexi Cardiolite  EF 48%, small area of scar @ base/mid inflat wall with mild peri-infarct ischemia.; CABG 12/4  . High cholesterol 12/2011  . Pneumonia ~ 2009  . Iron deficiency anemia   . TIA (transient ischemic attack)   . Anxiety   . History of blood transfusion 07/2011; 12/2011; 01/2012 X 2; 04/2012  . Carotid artery disease ([place])     a. [id number] LICA, [id number]   . Mitral regurgitation     a. Moderate by echo, 02/2012  . Myocardial infarction ([place])   . Chronic diastolic CHF (congestive heart failure) ([place])     a. 02/2012 Echo EF 60-65%, nl wall motion, Gr 1 DD, mod MR  . Hypertension   . AVM (arteriovenous malformation) of colon   . Esophageal stricture   . Ovarian cancer  ([place]) 1992  . Colon cancer ([place]) 1992  . ESRD on hemodialysis ([place])     ESRD due to HTN, started dialysis 2011 and gets HD at [facility] with Dr Hinda Lenis on MWF schedule.  Access is LUA AVF as of Sept 2014.    Past Surgical History  Procedure Laterality Date  . Abdominal hysterectomy  1992  . Appendectomy  06/1990  . Tubal ligation  1980's  . Av fistula placement  07/2009    left upper arm  . Thrombectomy / arteriovenous graft revision  2011    left upper arm  . Colon resection  1992  . Esophagogastroduodenoscopy  01/20/2012    Procedure: ESOPHAGOGASTRODUODENOSCOPY (EGD);  Surgeon: Ladene Artist, MD,FACG;  Location: [facility] ENDOSCOPY;  Service: Endoscopy;  Laterality: N/A;  . Dilation and curettage of uterus    . Coronary angioplasty with stent placement  12/15/11    "2"  . Coronary angioplasty with stent placement  y/2013    "1; makes total of 3" (05/02/2012)  . Coronary artery bypass graft  06/13/2012    Procedure: CORONARY ARTERY BYPASS GRAFTING (CABG);  Surgeon: Grace Isaac, MD;  Location: [place];  Service: Open Heart Surgery;  Laterality: N/A;  cabg x four;  using left internal mammary artery, and left leg greater saphenous vein harvested endoscopically  . Intraoperative transesophageal echocardiogram  06/13/2012    Procedure: INTRAOPERATIVE TRANSESOPHAGEAL ECHOCARDIOGRAM;  Surgeon: Grace Isaac, MD;  Location: [place];  Service: Open Heart Surgery;  Laterality: N/A;  . Esophagogastroduodenoscopy N/A 03/26/2013    Procedure: ESOPHAGOGASTRODUODENOSCOPY (EGD);  Surgeon: Irene Shipper, MD;  Location: [facility] ENDOSCOPY;  Service: Endoscopy;  Laterality: N/A;  . Ovary surgery      ovarian cancer  . Left heart catheterization with coronary angiogram N/A 12/15/2011    Procedure: LEFT HEART CATHETERIZATION WITH CORONARY ANGIOGRAM;  Surgeon: Burnell Blanks, MD;  Location: [facility] CATH LAB;  Service: Cardiovascular;  Laterality: N/A;  . Left heart catheterization with coronary angiogram N/A  01/10/2012    Procedure: LEFT HEART CATHETERIZATION WITH CORONARY ANGIOGRAM;  Surgeon: Peter M Martinique, MD;  Location: [facility] CATH LAB;  Service: Cardiovascular;  Laterality: N/A;  . Left heart catheterization with coronary angiogram N/A 06/08/2012    Procedure: LEFT HEART CATHETERIZATION WITH CORONARY ANGIOGRAM;  Surgeon: Burnell Blanks, MD;  Location: [facility] CATH LAB;  Service: Cardiovascular;  Laterality: N/A;  . Shuntogram N/A 10/15/2013    Procedure: Fistulogram;  Surgeon: Serafina Mitchell, MD;  Location: [facility] CATH LAB;  Service: Cardiovascular;  Laterality: N/A;  . Left heart catheterization with coronary/graft angiogram N/A 12/10/2013    Procedure: LEFT HEART CATHETERIZATION WITH Beatrix Fetters;  Surgeon: [name], MD;  Location: [facility] CATH LAB;  Service: Cardiovascular;  Laterality: N/A;  . Esophagogastroduodenoscopy N/A 04/30/2015    Procedure: ESOPHAGOGASTRODUODENOSCOPY (EGD);  Surgeon: Rogene Houston, MD;  [facility] ENDO [facility];  Service: Endoscopy;  Laterality: N/A;  1pm - moved to 10/20 @ 1:10   Family History  Problem Relation Age of Onset  . Other      noncontributory for early CAD  . Heart disease Mother     Heart Disease before age 56  . Hyperlipidemia Mother   . Hypertension Mother   . Diabetes Mother   . Heart attack Mother   . Heart disease Father     Heart Disease before age 28  . Hyperlipidemia Father   . Hypertension Father   . Diabetes Father   . Diabetes Sister   . Hypertension Sister   . Diabetes Brother   . Hyperlipidemia Brother   . Heart attack Brother   . Colon cancer Neg Hx   . Esophageal cancer Neg Hx   . Liver disease Neg Hx   . Kidney disease Neg Hx   . Colon polyps Neg Hx   . Hypertension Sister   . Heart attack Brother    Social History  Substance Use Topics  . Smoking status: Never Smoker   . Smokeless tobacco: Never Used  . Alcohol Use: No   OB History    Gravida Para Term Preterm AB TAB SAB Ectopic Multiple Living   2 2   2      2      Review of Systems  Constitutional: Negative for fever and chills.  HENT: Negative for congestion and ear pain.   Eyes: Negative for pain.  Respiratory: Negative for cough and shortness of breath.   Cardiovascular: Negative for chest pain.  Gastrointestinal: Positive for hematochezia. Negative for vomiting and abdominal pain.  Neurological: Negative for loss of consciousness.  All other systems reviewed and are negative.     Allergies  Aspirin; Contrast media; Iron; Macrodantin; Penicillins; Plavix; Amlodipine; Bactrim; Dexilant; Morphine and related; Nitrofurantoin; Prilosec; Sulfa antibiotics; Venofer; Levaquin; and Protonix  Home Medications   Prior to Admission medications   Medication Sig  Start Date End Date Taking? Authorizing Provider  acetaminophen (TYLENOL) 325 MG tablet Take 2 tablets (650 mg total) by mouth every 6 (six) hours as needed for mild pain (mild pain). 10/02/14   Erline Hau, MD  ALPRAZolam Duanne Moron) 0.5 MG tablet Take 1 tablet (0.5 mg total) by mouth 3 (three) times daily. 01/27/15   Theodis Blaze, MD  amLODipine (NORVASC) 2.5 MG tablet Take 1 tablet (2.5 mg total) by mouth daily. 07/28/15   Herminio Commons, MD  aspirin EC 81 MG tablet Take 81 mg by mouth daily.     Historical Provider, MD  benzonatate (TESSALON) 100 MG capsule Take 1 capsule (100 mg total) by mouth every 8 (eight) hours. 09/03/15   Erline Hau, MD  cloNIDine (CATAPRES) 0.3 MG tablet Take 1 tablet (0.3 mg total) by mouth 2 (two) times daily. 06/10/15   Herminio Commons, MD  fluticasone (FLONASE) 50 MCG/ACT nasal spray Place 2 sprays into the nose daily.     Historical Provider, MD  folic acid-vitamin b complex-vitamin c-selenium-zinc (DIALYVITE) 3 MG TABS Take 1 tablet by mouth daily.    Historical Provider, MD  hydrALAZINE (APRESOLINE) 50 MG tablet Take 2 tablets (100 mg total) by mouth 3 (three) times daily. 06/10/15   Herminio Commons, MD   isosorbide dinitrate (ISORDIL) 30 MG tablet Take 1 tablet (30 mg total) by mouth 3 (three) times daily. 07/28/15   Herminio Commons, MD  labetalol (NORMODYNE) 200 MG tablet Take 1.5 tablets (300 mg total) by mouth 2 (two) times daily. 06/17/15   Herminio Commons, MD  lanthanum (FOSRENOL) 1000 MG chewable tablet Chew 1,000 mg by mouth 3 (three) times daily after meals.    Historical Provider, MD  loratadine (CLARITIN) 10 MG tablet Take 10 mg by mouth daily.     Historical Provider, MD  nitroGLYCERIN (NITROSTAT) 0.4 MG SL tablet Place 1 tablet (0.4 mg total) under the tongue every 5 (five) minutes x 3 doses as needed for chest pain. 06/23/14   Herminio Commons, MD  omeprazole (PRILOSEC) 20 MG capsule Take 1 capsule (20 mg total) by mouth daily. 05/12/15   Rona Ravens Setzer, NP  ondansetron (ZOFRAN-ODT) 4 MG disintegrating tablet Take 4 mg by mouth every 8 (eight) hours as needed. nausea 05/18/15   Historical Provider, MD  oseltamivir (TAMIFLU) 30 MG capsule Take 1 capsule (30 mg total) by mouth every Monday, Wednesday, and Friday with hemodialysis. 08/31/15   Samuella Cota, MD  polyethylene glycol [facility] / Floria Raveling) packet Take 17 g by mouth daily. Patient taking differently: Take 17 g by mouth daily as needed for mild constipation or moderate constipation.  10/02/14   Erline Hau, MD  PROAIR HFA 108 (90 BASE) MCG/ACT inhaler Inhale 1 puff into the lungs every 6 (six) hours as needed for wheezing or shortness of breath.  01/09/15   Historical Provider, MD  SENSIPAR 30 MG tablet Take 30 mg by mouth daily with supper.  12/21/12   Historical Provider, MD  simvastatin (ZOCOR) 20 MG tablet TAKE ONE TABLET BY MOUTH AT BEDTIME 07/22/15   Herminio Commons, MD  tetrahydrozoline 0.05 % ophthalmic solution Place 1 drop into both eyes 2 (two) times daily as needed (irritation).    Historical Provider, MD   BP 160/83 mmHg  Pulse 58  Temp(Src) 98.6 F (37 C) (Oral)  Resp 17  Ht 5\' 1"  (1.549  m)  Wt 137 lb (62.143 kg)  BMI  25.90 kg/m2  SpO2 97% Physical Exam  Constitutional: She is oriented to person, place, and time. She appears well-developed and well-nourished.  HENT:  Head: Normocephalic and atraumatic.  Neck: Normal range of motion.  Cardiovascular: Regular rhythm.  Tachycardia present.   Pulmonary/Chest: Effort normal. No stridor. No respiratory distress.  Abdominal: Soft. She exhibits no distension. There is no tenderness. There is no rebound.  Musculoskeletal: Normal range of motion. She exhibits no edema or tenderness.  Neurological: She is alert and oriented to person, place, and time. No cranial nerve deficit. Coordination normal.  Skin: Skin is warm and dry. No rash noted.  Nursing note and vitals reviewed.   ED Course  Procedures (including critical care time) Labs Review Labs Reviewed  COMPREHENSIVE METABOLIC PANEL  CBC  OCCULT BLOOD X 1 CARD TO LAB, STOOL  PROTIME-INR  POC OCCULT BLOOD, ED  TYPE AND SCREEN    Imaging Review No results found. I have personally reviewed and evaluated these images and lab results as part of my medical decision-making.   EKG Interpretation   Date/Time:  Monday September 07 2015 16:27:39 EST Ventricular Rate:  128 PR Interval:    QRS Duration: 95 QT Interval:  344 QTC Calculation: 502 R Axis:   51 Text Interpretation:  Junctional tachycardia Probable LVH with secondary  repol abnrm Prolonged QT interval similar QRS to previous, junctional  rhythm new Confirmed by [facility] MD, Corene Cornea [phone]) on 09/07/2015 4:39:54 PM      MDM   Final diagnoses:  Anemia, unspecified anemia type   Melena for multiple months. A little bit of weakness now and chronic anemia. PCP advised coming to ED for eval. Here slightly tachycardic, but no other obvious abnormalities, will check hemoglobin, hemoccult. D/w GI.   Hemoccult negative. Hb stable. HR improved to low 100's with fluids. No other complaints. VS stable. Dark stools may be  from iron administration, no acute need for GI intervention, already has close follow up.     Merrily Pew, MD 09/09/15 1120

## 2015-09-07 NOTE — Telephone Encounter (Signed)
Recently in hospital and d/c with hemoglobin of 7.8

## 2015-09-08 ENCOUNTER — Emergency Department (HOSPITAL_COMMUNITY): Payer: Medicare HMO

## 2015-09-08 ENCOUNTER — Observation Stay (HOSPITAL_COMMUNITY)
Admission: EM | Admit: 2015-09-08 | Discharge: 2015-09-10 | Disposition: A | Discharge status: Home / Self Care | Payer: Medicare HMO | Attending: Family Medicine | Admitting: Family Medicine

## 2015-09-08 ENCOUNTER — Telehealth: Payer: Self-pay | Admitting: *Deleted

## 2015-09-08 ENCOUNTER — Encounter (HOSPITAL_COMMUNITY): Payer: Self-pay

## 2015-09-08 DIAGNOSIS — R42 Dizziness and giddiness: Secondary | ICD-10-CM | POA: Diagnosis not present

## 2015-09-08 DIAGNOSIS — J42 Unspecified chronic bronchitis: Secondary | ICD-10-CM | POA: Diagnosis not present

## 2015-09-08 DIAGNOSIS — Z79899 Other long term (current) drug therapy: Secondary | ICD-10-CM | POA: Diagnosis not present

## 2015-09-08 DIAGNOSIS — Z8719 Personal history of other diseases of the digestive system: Secondary | ICD-10-CM | POA: Diagnosis not present

## 2015-09-08 DIAGNOSIS — E7800 Pure hypercholesterolemia, unspecified: Secondary | ICD-10-CM | POA: Insufficient documentation

## 2015-09-08 DIAGNOSIS — D649 Anemia, unspecified: Secondary | ICD-10-CM | POA: Diagnosis not present

## 2015-09-08 DIAGNOSIS — I10 Essential (primary) hypertension: Secondary | ICD-10-CM | POA: Diagnosis present

## 2015-09-08 DIAGNOSIS — N186 End stage renal disease: Secondary | ICD-10-CM | POA: Diagnosis not present

## 2015-09-08 DIAGNOSIS — I120 Hypertensive chronic kidney disease with stage 5 chronic kidney disease or end stage renal disease: Secondary | ICD-10-CM | POA: Diagnosis not present

## 2015-09-08 DIAGNOSIS — R002 Palpitations: Secondary | ICD-10-CM | POA: Diagnosis present

## 2015-09-08 DIAGNOSIS — R0602 Shortness of breath: Secondary | ICD-10-CM | POA: Diagnosis not present

## 2015-09-08 DIAGNOSIS — I132 Hypertensive heart and chronic kidney disease with heart failure and with stage 5 chronic kidney disease, or end stage renal disease: Secondary | ICD-10-CM | POA: Diagnosis not present

## 2015-09-08 DIAGNOSIS — I2510 Atherosclerotic heart disease of native coronary artery without angina pectoris: Secondary | ICD-10-CM | POA: Diagnosis present

## 2015-09-08 DIAGNOSIS — I471 Supraventricular tachycardia, unspecified: Secondary | ICD-10-CM | POA: Diagnosis present

## 2015-09-08 DIAGNOSIS — Z85038 Personal history of other malignant neoplasm of large intestine: Secondary | ICD-10-CM | POA: Diagnosis not present

## 2015-09-08 DIAGNOSIS — I5032 Chronic diastolic (congestive) heart failure: Secondary | ICD-10-CM | POA: Diagnosis not present

## 2015-09-08 DIAGNOSIS — Z7982 Long term (current) use of aspirin: Secondary | ICD-10-CM | POA: Diagnosis not present

## 2015-09-08 DIAGNOSIS — R778 Other specified abnormalities of plasma proteins: Secondary | ICD-10-CM

## 2015-09-08 DIAGNOSIS — Z992 Dependence on renal dialysis: Secondary | ICD-10-CM

## 2015-09-08 DIAGNOSIS — M1990 Unspecified osteoarthritis, unspecified site: Secondary | ICD-10-CM | POA: Diagnosis not present

## 2015-09-08 DIAGNOSIS — I252 Old myocardial infarction: Secondary | ICD-10-CM | POA: Diagnosis not present

## 2015-09-08 DIAGNOSIS — Z8543 Personal history of malignant neoplasm of ovary: Secondary | ICD-10-CM | POA: Insufficient documentation

## 2015-09-08 DIAGNOSIS — R7989 Other specified abnormal findings of blood chemistry: Secondary | ICD-10-CM

## 2015-09-08 DIAGNOSIS — Z8673 Personal history of transient ischemic attack (TIA), and cerebral infarction without residual deficits: Secondary | ICD-10-CM | POA: Insufficient documentation

## 2015-09-08 DIAGNOSIS — R000 Tachycardia, unspecified: Secondary | ICD-10-CM | POA: Diagnosis present

## 2015-09-08 LAB — TROPONIN I
Troponin I: 0.12 ng/mL — ABNORMAL HIGH (ref <0.031)
Troponin I: 0.12 ng/mL — ABNORMAL HIGH (ref <0.031)

## 2015-09-08 LAB — I-STAT CHEM 8, ED
BUN: 25 mg/dL — AB (ref 6–20)
CHLORIDE: 97 mmol/L — AB (ref 101–111)
CREATININE: 8.3 mg/dL — AB (ref 0.44–1.00)
Calcium, Ion: 1.07 mmol/L — ABNORMAL LOW (ref 1.13–1.30)
Glucose, Bld: 100 mg/dL — ABNORMAL HIGH (ref 65–99)
HEMATOCRIT: 26 % — AB (ref 36.0–46.0)
Hemoglobin: 8.8 g/dL — ABNORMAL LOW (ref 12.0–15.0)
POTASSIUM: 4.1 mmol/L (ref 3.5–5.1)
Sodium: 137 mmol/L (ref 135–145)
TCO2: 29 mmol/L (ref 0–100)

## 2015-09-08 LAB — COMPREHENSIVE METABOLIC PANEL
ALBUMIN: 3.3 g/dL — AB (ref 3.5–5.0)
ALT: 18 U/L (ref 14–54)
ANION GAP: 10 (ref 5–15)
AST: 26 U/L (ref 15–41)
Alkaline Phosphatase: 113 U/L (ref 38–126)
BUN: 24 mg/dL — ABNORMAL HIGH (ref 6–20)
CO2: 29 mmol/L (ref 22–32)
Calcium: 8.9 mg/dL (ref 8.9–10.3)
Chloride: 99 mmol/L — ABNORMAL LOW (ref 101–111)
Creatinine, Ser: 8.52 mg/dL — ABNORMAL HIGH (ref 0.44–1.00)
GFR calc Af Amer: 5 mL/min — ABNORMAL LOW (ref >60)
GFR calc non Af Amer: 4 mL/min — ABNORMAL LOW (ref >60)
GLUCOSE: 108 mg/dL — AB (ref 65–99)
POTASSIUM: 4.2 mmol/L (ref 3.5–5.1)
SODIUM: 138 mmol/L (ref 135–145)
TOTAL PROTEIN: 6.3 g/dL — AB (ref 6.5–8.1)
Total Bilirubin: 0.3 mg/dL (ref 0.3–1.2)

## 2015-09-08 LAB — CBC WITH DIFFERENTIAL/PLATELET
BASOS ABS: 0 10*3/uL (ref 0.0–0.1)
BASOS PCT: 1 %
Eosinophils Absolute: 0.5 10*3/uL (ref 0.0–0.7)
Eosinophils Relative: 7 %
HEMATOCRIT: 24.4 % — AB (ref 36.0–46.0)
HEMOGLOBIN: 7.7 g/dL — AB (ref 12.0–15.0)
LYMPHS PCT: 12 %
Lymphs Abs: 0.8 10*3/uL (ref 0.7–4.0)
MCH: 32.4 pg (ref 26.0–34.0)
MCHC: 31.6 g/dL (ref 30.0–36.0)
MCV: 102.5 fL — AB (ref 78.0–100.0)
Monocytes Absolute: 0.8 10*3/uL (ref 0.1–1.0)
Monocytes Relative: 12 %
NEUTROS ABS: 4.4 10*3/uL (ref 1.7–7.7)
NEUTROS PCT: 68 %
Platelets: 288 10*3/uL (ref 150–400)
RBC: 2.38 MIL/uL — ABNORMAL LOW (ref 3.87–5.11)
RDW: 17.2 % — ABNORMAL HIGH (ref 11.5–15.5)
WBC: 6.4 10*3/uL (ref 4.0–10.5)

## 2015-09-08 LAB — TSH: TSH: 0.999 u[IU]/mL (ref 0.350–4.500)

## 2015-09-08 IMAGING — CR DG CHEST 1V PORT
1 series · 1 of 1 positions shown · non-contrast
Comparison: [DATE]

CLINICAL DATA: Tachycardia, chest tightness and shortness of breath
since this morning

EXAM:
PORTABLE CHEST 1 VIEW

[ap portable]
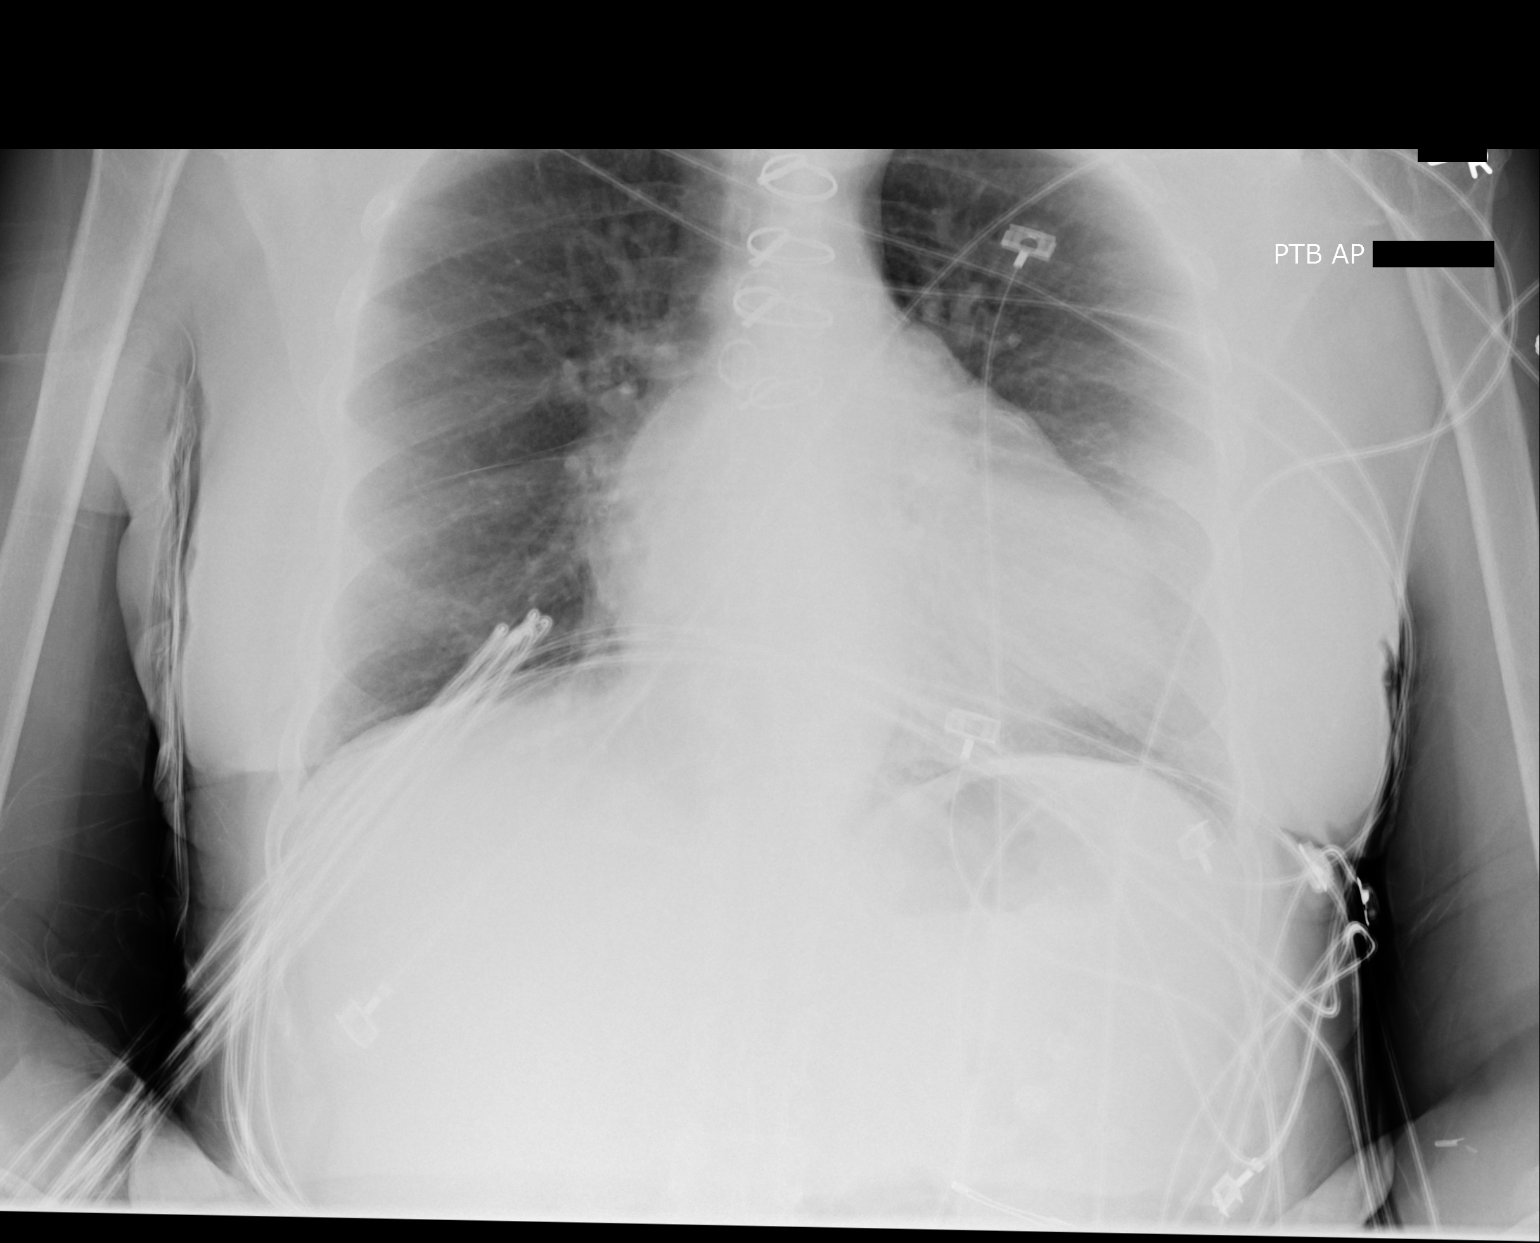

[1 of 1 positions shown; findings below may reference images not displayed]

FINDINGS: Previous coronary bypass noted. Native coronary stents present. Mild
cardiomegaly without acute CHF or pneumonia. No focal collapse or
consolidation. Negative for effusion or pneumothorax. Monitor leads
overlie the chest.
IMPRESSION: Mild cardiomegaly without acute process. Stable postoperative
findings.

## 2015-09-08 MED ORDER — PANTOPRAZOLE SODIUM 40 MG PO TBEC: 40.0000 mg | DELAYED_RELEASE_TABLET | Freq: Every day | ORAL | Status: DC

## 2015-09-08 MED ORDER — LABETALOL HCL 200 MG PO TABS
300.0000 mg | ORAL_TABLET | Freq: Three times a day (TID) | ORAL | Status: DC
  Administered 2015-09-10: 300 mg via ORAL
  Filled 2015-09-08 (×3): qty 2

## 2015-09-08 MED ORDER — OMEPRAZOLE 20 MG PO CPDR
20.0000 mg | DELAYED_RELEASE_CAPSULE | Freq: Every day | ORAL | Status: DC
  Filled 2015-09-08 (×3): qty 1

## 2015-09-08 MED ORDER — SODIUM CHLORIDE 0.9 % IV BOLUS (SEPSIS)
500.0000 mL | Freq: Once | INTRAVENOUS | Status: AC
  Administered 2015-09-08: 500 mL via INTRAVENOUS

## 2015-09-08 MED ORDER — CLONIDINE HCL 0.2 MG PO TABS
0.3000 mg | ORAL_TABLET | Freq: Two times a day (BID) | ORAL | Status: DC
  Administered 2015-09-08 – 2015-09-09 (×2): 0.3 mg via ORAL
  Filled 2015-09-08 (×4): qty 1

## 2015-09-08 MED ORDER — ASPIRIN EC 81 MG PO TBEC
81.0000 mg | DELAYED_RELEASE_TABLET | Freq: Every day | ORAL | Status: DC
  Administered 2015-09-10: 81 mg via ORAL
  Filled 2015-09-08 (×2): qty 1

## 2015-09-08 MED ORDER — SODIUM CHLORIDE 0.9% FLUSH
3.0000 mL | Freq: Two times a day (BID) | INTRAVENOUS | Status: DC
  Administered 2015-09-08 – 2015-09-10 (×3): 3 mL via INTRAVENOUS

## 2015-09-08 MED ORDER — CINACALCET HCL 30 MG PO TABS
30.0000 mg | ORAL_TABLET | Freq: Every day | ORAL | Status: DC
  Filled 2015-09-08 (×3): qty 1

## 2015-09-08 MED ORDER — ISOSORBIDE DINITRATE 20 MG PO TABS
30.0000 mg | ORAL_TABLET | Freq: Three times a day (TID) | ORAL | Status: DC
  Administered 2015-09-08 – 2015-09-09 (×2): 30 mg via ORAL
  Filled 2015-09-08 (×4): qty 2

## 2015-09-08 MED ORDER — ACETAMINOPHEN 650 MG RE SUPP: 650.0000 mg | Freq: Four times a day (QID) | RECTAL | Status: DC | PRN

## 2015-09-08 MED ORDER — ONDANSETRON HCL 4 MG PO TABS: 4.0000 mg | ORAL_TABLET | Freq: Four times a day (QID) | ORAL | Status: DC | PRN

## 2015-09-08 MED ORDER — DILTIAZEM HCL 100 MG IV SOLR
5.0000 mg/h | INTRAVENOUS | Status: DC
  Administered 2015-09-08: 5 mg/h via INTRAVENOUS
  Filled 2015-09-08: qty 100

## 2015-09-08 MED ORDER — AMLODIPINE BESYLATE 5 MG PO TABS
2.5000 mg | ORAL_TABLET | Freq: Every day | ORAL | Status: DC
  Administered 2015-09-09 – 2015-09-10 (×2): 2.5 mg via ORAL
  Filled 2015-09-08 (×2): qty 1

## 2015-09-08 MED ORDER — ONDANSETRON HCL 4 MG/2ML IJ SOLN: 4.0000 mg | Freq: Four times a day (QID) | INTRAMUSCULAR | Status: DC | PRN

## 2015-09-08 MED ORDER — LANTHANUM CARBONATE 500 MG PO CHEW
1000.0000 mg | CHEWABLE_TABLET | Freq: Three times a day (TID) | ORAL | Status: DC
  Administered 2015-09-09: 1000 mg via ORAL
  Administered 2015-09-09: 500 mg via ORAL
  Administered 2015-09-10: 1000 mg via ORAL
  Filled 2015-09-08 (×9): qty 2

## 2015-09-08 MED ORDER — SODIUM CHLORIDE 0.9% FLUSH
3.0000 mL | Freq: Two times a day (BID) | INTRAVENOUS | Status: DC
  Administered 2015-09-08 – 2015-09-09 (×2): 3 mL via INTRAVENOUS

## 2015-09-08 MED ORDER — SIMVASTATIN 20 MG PO TABS
20.0000 mg | ORAL_TABLET | Freq: Every day | ORAL | Status: DC
  Administered 2015-09-08: 20 mg via ORAL
  Filled 2015-09-08 (×2): qty 1

## 2015-09-08 MED ORDER — ALPRAZOLAM 0.5 MG PO TABS
0.5000 mg | ORAL_TABLET | Freq: Three times a day (TID) | ORAL | Status: DC
  Administered 2015-09-08 – 2015-09-09 (×2): 0.5 mg via ORAL
  Filled 2015-09-08 (×4): qty 1

## 2015-09-08 MED ORDER — RENA-VITE PO TABS
1.0000 | ORAL_TABLET | Freq: Every day | ORAL | Status: DC
  Administered 2015-09-10: 1 via ORAL
  Filled 2015-09-08 (×2): qty 1

## 2015-09-08 MED ORDER — SODIUM CHLORIDE 0.9% FLUSH: 3.0000 mL | INTRAVENOUS | Status: DC | PRN

## 2015-09-08 MED ORDER — HEPARIN SODIUM (PORCINE) 5000 UNIT/ML IJ SOLN
5000.0000 [IU] | Freq: Three times a day (TID) | INTRAMUSCULAR | Status: DC
  Filled 2015-09-08 (×2): qty 1

## 2015-09-08 MED ORDER — DILTIAZEM LOAD VIA INFUSION
10.0000 mg | Freq: Once | INTRAVENOUS | Status: AC
  Administered 2015-09-08: 10 mg via INTRAVENOUS
  Filled 2015-09-08: qty 10

## 2015-09-08 MED ORDER — SODIUM CHLORIDE 0.9 % IV SOLN: 250.0000 mL | INTRAVENOUS | Status: DC | PRN

## 2015-09-08 MED ORDER — SENNOSIDES-DOCUSATE SODIUM 8.6-50 MG PO TABS: 1.0000 | ORAL_TABLET | Freq: Every evening | ORAL | Status: DC | PRN

## 2015-09-08 MED ORDER — LORATADINE 10 MG PO TABS
10.0000 mg | ORAL_TABLET | Freq: Every day | ORAL | Status: DC
  Administered 2015-09-09 – 2015-09-10 (×2): 10 mg via ORAL
  Filled 2015-09-08 (×2): qty 1

## 2015-09-08 MED ORDER — ACETAMINOPHEN 325 MG PO TABS: 650.0000 mg | ORAL_TABLET | Freq: Four times a day (QID) | ORAL | Status: DC | PRN

## 2015-09-08 MED ORDER — ALBUTEROL SULFATE (2.5 MG/3ML) 0.083% IN NEBU: 3.0000 mL | INHALATION_SOLUTION | Freq: Four times a day (QID) | RESPIRATORY_TRACT | Status: DC | PRN

## 2015-09-08 MED ORDER — SODIUM CHLORIDE 0.9 % IV SOLN
INTRAVENOUS | Status: DC
  Administered 2015-09-08: 15:00:00 via INTRAVENOUS

## 2015-09-08 MED ORDER — OSELTAMIVIR PHOSPHATE 30 MG PO CAPS
30.0000 mg | ORAL_CAPSULE | ORAL | Status: DC
  Filled 2015-09-08 (×2): qty 1

## 2015-09-08 MED ORDER — NITROGLYCERIN 0.4 MG SL SUBL: 0.4000 mg | SUBLINGUAL_TABLET | SUBLINGUAL | Status: DC | PRN

## 2015-09-08 MED ORDER — HYDRALAZINE HCL 25 MG PO TABS
100.0000 mg | ORAL_TABLET | Freq: Three times a day (TID) | ORAL | Status: DC
  Administered 2015-09-08 – 2015-09-09 (×2): 100 mg via ORAL
  Filled 2015-09-08 (×4): qty 4

## 2015-09-08 NOTE — Telephone Encounter (Signed)
Patient calling with c/o chest tightness/pain (9/10).  HR 141.  States she does not feel good at all.  Advised ED / call 911 for further evaluation.  Patient verbalized understanding.

## 2015-09-08 NOTE — ED Notes (Signed)
Patient reports of waking up with fullness in chest and palpitations this morning. States she is having some shortness of breath. NAD noted in triage.

## 2015-09-08 NOTE — ED Notes (Signed)
MD at bedside. 

## 2015-09-08 NOTE — H&P (Signed)
[facility]          History and Physical    PCP:   [name], MD   EDP: Francine Graven, M.D.  Chief Complaint:  Palpitations, dizziness  HPI: Patient is a 75 year old woman well-known to me for recent hospitalization for influenza during which she developed an SVT that quickly resolved with adenosine. She represents to the hospital today with above complaints and was found again in the ED to have a narrow complex tachycardia with rate in the 150s. She was started on a Cardizem bolus and drip and this resolved rapidly and she has since been taken off the Cardizem. In the ED she was also found to have a troponin of 0.12 and admission has been requested. She has a history of coronary artery disease status post CABG, hypertension, hyperlipidemia, chronic diastolic CHF and end-stage renal disease on hemodialysis. Her last cardiac catheterization was in June 2015 that showed a patent left main, focal significant disease in the mid to distal left LAD, LIMA to LAD was widely patent, severe restenosis of the proximal left circumflex artery stent, patent SVG to circumflex, 70% proximal ramus stenosis, widely patent SVG to ramus, occluded stent at the posterior of the right coronary artery and widely patent SVG to distal RCA. She had a normal nuclear stress test in February 2016. We are asked to admit her for further evaluation and management.  Allergies:   Allergies  Allergen Reactions  . Aspirin Other (See Comments)    Mess up her stomach; "makes my bowels have blood in them". Takes 81 mg EC Aspirin   . Contrast Media [Iodinated Diagnostic Agents] Itching  . Iron Itching and Other (See Comments)    "they gave me iron in dialysis; had to give me Benadryl cause I had to have the iron" (05/02/2012)  . Penicillins Other (See Comments)    "makes me real weak when I take it; like I'll pass out"Has patient had a PCN reaction causing immediate rash, facial/tongue/throat  swelling, SOB or lightheadedness with hypotension: Yesno Has patient had a PCN reaction causing severe rash involving mucus membranes or skin necrosis: Yesno Has patient had a PCN reaction that required hospitalization Yesno Has patient had a PCN reaction occurring within the last 10 years: Jolyne Loa If all of the above  . Plavix [Clopidogrel Bisulfate] Rash  . Amlodipine Swelling    Leg swelling  . Bactrim [Sulfamethoxazole-Trimethoprim] Rash  . Dexilant [Dexlansoprazole] Other (See Comments)    Upset stomach  . Morphine And Related Itching    Itching in feet  . Nitrofurantoin Hives  . Prilosec [Omeprazole] Other (See Comments)    "back spasms"  . Venofer [Ferric Oxide] Itching    Patient reports using Benadryl prior to doses as [facility]  . Levaquin [Levofloxacin In D5w] Rash  . Protonix [Pantoprazole Sodium] Rash      Past Medical History  Diagnosis Date  . Chronic bronchitis ([place])   . GERD (gastroesophageal reflux disease)   . PUD (peptic ulcer disease)   . History of lower GI bleeding   . Arthritis   . History of gout   . CAD (coronary artery disease)     a. 12/2011 NSTEMI/Cath/PCI LCX (2.25x14 Resolute DES) & D1 (2.25x22 Resolute DES);  b. 01/2012 Cath/PCI: LM 30, LAD 30p, 40-36m D1 stent ok, 99 in sm branch of diag, LCX patent stent, OM1 20, RCA 95 ost (4.0x12 Promus DES),  EF 55%;  c. 04/2012 Lexi Cardiolite  EF 48%, small area of scar @ base/mid inflat wall with mild peri-infarct ischemia.; CABG 12/4  . High cholesterol 12/2011  . Pneumonia ~ 2009  . Iron deficiency anemia   . TIA (transient ischemic attack)   . Anxiety   . History of blood transfusion 07/2011; 12/2011; 01/2012 X 2; 04/2012  . Carotid artery disease ([place])     a. 14-97% LICA, 0/2637   . Mitral regurgitation     a. Moderate by echo, 02/2012  . Myocardial infarction ([place])   . Chronic diastolic CHF (congestive heart failure) ([place])     a. 02/2012 Echo EF 60-65%, nl wall motion, Gr 1 DD, mod MR  .  Hypertension   . AVM (arteriovenous malformation) of colon   . Esophageal stricture   . Ovarian cancer ([place]) 1992  . Colon cancer ([place]) 1992  . ESRD on hemodialysis ([place])     ESRD due to HTN, started dialysis 2011 and gets HD at [facility] with Dr Hinda Lenis on MWF schedule.  Access is LUA AVF as of Sept 2014.     Past Surgical History  Procedure Laterality Date  . Abdominal hysterectomy  1992  . Appendectomy  06/1990  . Tubal ligation  1980's  . Av fistula placement  07/2009    left upper arm  . Thrombectomy / arteriovenous graft revision  2011    left upper arm  . Colon resection  1992  . Esophagogastroduodenoscopy  01/20/2012    Procedure: ESOPHAGOGASTRODUODENOSCOPY (EGD);  Surgeon: Ladene Artist, MD,FACG;  Location: [facility] ENDOSCOPY;  Service: Endoscopy;  Laterality: N/A;  . Dilation and curettage of uterus    . Coronary angioplasty with stent placement  12/15/11    "2"  . Coronary angioplasty with stent placement  y/2013    "1; makes total of 3" (05/02/2012)  . Coronary artery bypass graft  06/13/2012    Procedure: CORONARY ARTERY BYPASS GRAFTING (CABG);  Surgeon: Grace Isaac, MD;  Location: [place];  Service: Open Heart Surgery;  Laterality: N/A;  cabg x four;  using left internal mammary artery, and left leg greater saphenous vein harvested endoscopically  . Intraoperative transesophageal echocardiogram  06/13/2012    Procedure: INTRAOPERATIVE TRANSESOPHAGEAL ECHOCARDIOGRAM;  Surgeon: Grace Isaac, MD;  Location: [place];  Service: Open Heart Surgery;  Laterality: N/A;  . Esophagogastroduodenoscopy N/A 03/26/2013    Procedure: ESOPHAGOGASTRODUODENOSCOPY (EGD);  Surgeon: Irene Shipper, MD;  Location: [facility] ENDOSCOPY;  Service: Endoscopy;  Laterality: N/A;  . Ovary surgery      ovarian cancer  . Left heart catheterization with coronary angiogram N/A 12/15/2011    Procedure: LEFT HEART CATHETERIZATION WITH CORONARY ANGIOGRAM;  Surgeon: Burnell Blanks, MD;  Location: [facility] CATH LAB;   Service: Cardiovascular;  Laterality: N/A;  . Left heart catheterization with coronary angiogram N/A 01/10/2012    Procedure: LEFT HEART CATHETERIZATION WITH CORONARY ANGIOGRAM;  Surgeon: Peter M Martinique, MD;  Location: [facility] CATH LAB;  Service: Cardiovascular;  Laterality: N/A;  . Left heart catheterization with coronary angiogram N/A 06/08/2012    Procedure: LEFT HEART CATHETERIZATION WITH CORONARY ANGIOGRAM;  Surgeon: Burnell Blanks, MD;  Location: [facility] CATH LAB;  Service: Cardiovascular;  Laterality: N/A;  . Shuntogram N/A 10/15/2013    Procedure: Fistulogram;  Surgeon: Serafina Mitchell, MD;  Location: [facility] CATH LAB;  Service: Cardiovascular;  Laterality: N/A;  . Left heart catheterization with coronary/graft angiogram N/A 12/10/2013    Procedure: LEFT HEART CATHETERIZATION WITH  Beatrix Fetters;  Surgeon: [name], MD;  Location: [facility] CATH LAB;  Service: Cardiovascular;  Laterality: N/A;  . Esophagogastroduodenoscopy N/A 04/30/2015    Procedure: ESOPHAGOGASTRODUODENOSCOPY (EGD);  Surgeon: Rogene Houston, MD;  Location: [facility] ENDO SUITE;  Service: Endoscopy;  Laterality: N/A;  1pm - moved to 10/20 @ 1:10    Prior to Admission medications   Medication Sig Start Date End Date Taking? Authorizing Provider  ALPRAZolam Duanne Moron) 0.5 MG tablet Take 1 tablet (0.5 mg total) by mouth 3 (three) times daily. 01/27/15  Yes Theodis Blaze, MD  amLODipine (NORVASC) 2.5 MG tablet Take 1 tablet (2.5 mg total) by mouth daily. 07/28/15  Yes Herminio Commons, MD  aspirin EC 81 MG tablet Take 81 mg by mouth daily.    Yes Historical Provider, MD  benzonatate (TESSALON) 100 MG capsule Take 1 capsule (100 mg total) by mouth every 8 (eight) hours. 09/03/15  Yes Erline Hau, MD  cloNIDine (CATAPRES) 0.3 MG tablet Take 1 tablet (0.3 mg total) by mouth 2 (two) times daily. Patient taking differently: Take 0.3 mg by mouth daily. *May take an additional tablet based on blood pressure levels* 06/10/15   Yes Herminio Commons, MD  fluticasone (FLONASE) 50 MCG/ACT nasal spray Place 2 sprays into the nose daily.    Yes Historical Provider, MD  folic acid-vitamin b complex-vitamin c-selenium-zinc (DIALYVITE) 3 MG TABS Take 1 tablet by mouth daily.   Yes Historical Provider, MD  hydrALAZINE (APRESOLINE) 50 MG tablet Take 2 tablets (100 mg total) by mouth 3 (three) times daily. 06/10/15  Yes Herminio Commons, MD  isosorbide dinitrate (ISORDIL) 30 MG tablet Take 1 tablet (30 mg total) by mouth 3 (three) times daily. 07/28/15  Yes Herminio Commons, MD  labetalol (NORMODYNE) 200 MG tablet Take 1.5 tablets (300 mg total) by mouth 2 (two) times daily. 06/17/15  Yes Herminio Commons, MD  lanthanum (FOSRENOL) 1000 MG chewable tablet Chew 1,000 mg by mouth 3 (three) times daily after meals.   Yes Historical Provider, MD  loratadine (CLARITIN) 10 MG tablet Take 10 mg by mouth daily.    Yes Historical Provider, MD  nitroGLYCERIN (NITROSTAT) 0.4 MG SL tablet Place 1 tablet (0.4 mg total) under the tongue every 5 (five) minutes x 3 doses as needed for chest pain. 06/23/14  Yes Herminio Commons, MD  omeprazole (PRILOSEC) 20 MG capsule Take 1 capsule (20 mg total) by mouth daily. 05/12/15  Yes Butch Penny, NP  ondansetron (ZOFRAN-ODT) 4 MG disintegrating tablet Take 4 mg by mouth every 8 (eight) hours as needed for nausea or vomiting. nausea 05/18/15  Yes Historical Provider, MD  oseltamivir (TAMIFLU) 30 MG capsule Take 1 capsule (30 mg total) by mouth every Monday, Wednesday, and Friday with hemodialysis. 08/31/15  Yes Samuella Cota, MD  PROAIR HFA 108 [phone] BASE) MCG/ACT inhaler Inhale 1 puff into the lungs every 6 (six) hours as needed for wheezing or shortness of breath.  01/09/15  Yes Historical Provider, MD  SENSIPAR 30 MG tablet Take 30 mg by mouth daily with supper.  12/21/12  Yes Historical Provider, MD  simvastatin (ZOCOR) 20 MG tablet TAKE ONE TABLET BY MOUTH AT BEDTIME 07/22/15  Yes Herminio Commons, MD  tetrahydrozoline 0.05 % ophthalmic solution Place 1 drop into both eyes 2 (two) times daily as needed (irritation).   Yes Historical Provider, MD  acetaminophen (TYLENOL) 325 MG tablet Take 2 tablets (650 mg total) by mouth every  6 (six) hours as needed for mild pain (mild pain). Patient not taking: Reported on 09/07/2015 10/02/14   Erline Hau, MD    Social History:  reports that she has never smoked. She has never used smokeless tobacco. She reports that she does not drink alcohol or use illicit drugs.  Family History  Problem Relation Age of Onset  . Other      noncontributory for early CAD  . Heart disease Mother     Heart Disease before age 57  . Hyperlipidemia Mother   . Hypertension Mother   . Diabetes Mother   . Heart attack Mother   . Heart disease Father     Heart Disease before age 7  . Hyperlipidemia Father   . Hypertension Father   . Diabetes Father   . Diabetes Sister   . Hypertension Sister   . Diabetes Brother   . Hyperlipidemia Brother   . Heart attack Brother   . Colon cancer Neg Hx   . Esophageal cancer Neg Hx   . Liver disease Neg Hx   . Kidney disease Neg Hx   . Colon polyps Neg Hx   . Hypertension Sister   . Heart attack Brother     Review of Systems:  Constitutional: Denies fever, chills, diaphoresis, appetite change and fatigue.  HEENT: Denies photophobia, eye pain, redness, hearing loss, ear pain, congestion, sore throat, rhinorrhea, sneezing, mouth sores, trouble swallowing, neck pain, neck stiffness and tinnitus.   Respiratory: Denies SOB, DOE, cough, chest tightness,  and wheezing.   Cardiovascular: Denies chest pain, palpitations and leg swelling.  Gastrointestinal: Denies nausea, vomiting, abdominal pain, diarrhea, constipation, blood in stool and abdominal distention.  Genitourinary: Denies dysuria, urgency, frequency, hematuria, flank pain and difficulty urinating.  Endocrine: Denies: hot or cold intolerance,  sweats, changes in hair or nails, polyuria, polydipsia. Musculoskeletal: Denies myalgias, back pain, joint swelling, arthralgias and gait problem.  Skin: Denies pallor, rash and wound.  Neurological: Denies dizziness, seizures, syncope, weakness, light-headedness, numbness and headaches.  Hematological: Denies adenopathy. Easy bruising, personal or family bleeding history  Psychiatric/Behavioral: Denies suicidal ideation, mood changes, confusion, nervousness, sleep disturbance and agitation   Physical Exam: Blood pressure 216/87, pulse 86, temperature 98.8 F (37.1 C), temperature source Oral, resp. rate 12, height _0  (1.549 m), weight 63.05 kg (139 lb), SpO2 100 %. General: Alert, awake, oriented 3 HEENT: Normocephalic, atraumatic, pupils equal round and reactive to light, extraocular movements intact Neck: Supple, no JVD, no lymphadenopathy, no bruits, no goiter is an cardiovascular: Regular rate and rhythm, no murmurs, rubs or gallops Lungs: Clear to auscultation bilaterally Abdomen: Soft, nontender, nondistended, positive bowel sounds next and extremities: Trace bilateral edema, positive pulses Neurologic: Grossly intact and nonfocal  Labs on Admission:  Results for orders placed or performed during the hospital encounter of 09/08/15 (from the past 48 hour(s))  Comprehensive metabolic panel     Status: Abnormal   Collection Time: 09/08/15  1:10 PM  Result Value Ref Range   Sodium 138 135 - 145 mmol/L   Potassium 4.2 3.5 - 5.1 mmol/L   Chloride 99 (L) 101 - 111 mmol/L   CO2 29 22 - 32 mmol/L   Glucose, Bld 108 (H) 65 - 99 mg/dL   BUN 24 (H) 6 - 20 mg/dL   Creatinine, Ser 8.52 (H) 0.44 - 1.00 mg/dL   Calcium 8.9 8.9 - 10.3 mg/dL   Total Protein 6.3 (L) 6.5 - 8.1 g/dL   Albumin 3.3 (L) 3.5 -  5.0 g/dL   AST 26 15 - 41 U/L   ALT 18 14 - 54 U/L   Alkaline Phosphatase 113 38 - 126 U/L   Total Bilirubin 0.3 0.3 - 1.2 mg/dL   GFR calc non Af Amer 4 (L) >60 mL/min   GFR calc Af  Amer 5 (L) >60 mL/min    Comment: (NOTE) The eGFR has been calculated using the CKD EPI equation. This calculation has not been validated in all clinical situations. eGFR's persistently <60 mL/min signify possible Chronic Kidney Disease.    Anion gap 10 5 - 15  Troponin I     Status: Abnormal   Collection Time: 09/08/15  1:10 PM  Result Value Ref Range   Troponin I 0.12 (H) <0.031 ng/mL    Comment:        PERSISTENTLY INCREASED TROPONIN VALUES IN THE RANGE OF 0.04-0.49 ng/mL CAN BE SEEN IN:       -UNSTABLE ANGINA       -CONGESTIVE HEART FAILURE       -MYOCARDITIS       -CHEST TRAUMA       -ARRYHTHMIAS       -LATE PRESENTING MYOCARDIAL INFARCTION       -COPD   CLINICAL FOLLOW-UP RECOMMENDED.   CBC with Differential     Status: Abnormal   Collection Time: 09/08/15  1:10 PM  Result Value Ref Range   WBC 6.4 4.0 - 10.5 K/uL   RBC 2.38 (L) 3.87 - 5.11 MIL/uL   Hemoglobin 7.7 (L) 12.0 - 15.0 g/dL   HCT 24.4 (L) 36.0 - 46.0 %   MCV 102.5 (H) 78.0 - 100.0 fL   MCH 32.4 26.0 - 34.0 pg   MCHC 31.6 30.0 - 36.0 g/dL   RDW 17.2 (H) 11.5 - 15.5 %   Platelets 288 150 - 400 K/uL   Neutrophils Relative % 68 %   Neutro Abs 4.4 1.7 - 7.7 K/uL   Lymphocytes Relative 12 %   Lymphs Abs 0.8 0.7 - 4.0 K/uL   Monocytes Relative 12 %   Monocytes Absolute 0.8 0.1 - 1.0 K/uL   Eosinophils Relative 7 %   Eosinophils Absolute 0.5 0.0 - 0.7 K/uL   Basophils Relative 1 %   Basophils Absolute 0.0 0.0 - 0.1 K/uL  Type and screen     Status: None   Collection Time: 09/08/15  1:10 PM  Result Value Ref Range   ABO/RH(D) O POS    Antibody Screen NEG    Sample Expiration 09/11/2015   I-stat Chem 8, ED     Status: Abnormal   Collection Time: 09/08/15  1:18 PM  Result Value Ref Range   Sodium 137 135 - 145 mmol/L   Potassium 4.1 3.5 - 5.1 mmol/L   Chloride 97 (L) 101 - 111 mmol/L   BUN 25 (H) 6 - 20 mg/dL   Creatinine, Ser 8.30 (H) 0.44 - 1.00 mg/dL   Glucose, Bld 100 (H) 65 - 99 mg/dL    Calcium, Ion 1.07 (L) 1.13 - 1.30 mmol/L   TCO2 29 0 - 100 mmol/L   Hemoglobin 8.8 (L) 12.0 - 15.0 g/dL   HCT 26.0 (L) 36.0 - 46.0 %    Radiological Exams on Admission: Dg Chest Port 1 View  09/08/2015  CLINICAL DATA:  Tachycardia, chest tightness and shortness of breath since this morning EXAM: PORTABLE CHEST 1 VIEW COMPARISON:  08/31/2015 FINDINGS: Previous coronary bypass noted. Native coronary stents present. Mild cardiomegaly without acute CHF  or pneumonia. No focal collapse or consolidation. Negative for effusion or pneumothorax. Monitor leads overlie the chest. IMPRESSION: Mild cardiomegaly without acute process. Stable postoperative findings. Electronically Signed   By: Jerilynn Mages.  Shick M.D.   On: 09/08/2015 13:50    Assessment/Plan Principal Problem:   Narrow complex tachycardia (HCC) Active Problems:   Coronary atherosclerosis of native coronary artery   Essential hypertension, benign   ESRD on hemodialysis (HCC)   SVT (supraventricular tachycardia) (HCC)    Narrow complex tachycardia/SVT -Second recurrence in less than a week. -We'll increase her labetalol to 300 3 times a day. -Will request cardiology evaluation, wonder if she will need EP evaluation.  Elevated troponin -I suspect this represents demand ischemia due to her elevated heart rate more so than true cardiac disease. -However she does have history of coronary artery disease so we'll cycle troponins and repeat her echocardiogram as the last we have on file is 75 years old. -As long as troponins do not rise significantly and her ejection fraction is maintained, do not suspect further cardiac evaluation.  Hypertension -Blood pressure is elevated today, however she states she has not taken any medications today.  -We'll restart home medications, labetalol has been increase from 300 twice a day to 3 times a day due to rapid rates.  Recent influenza -She still has 2 more doses of Tamiflu remaining, will  schedule.  End-stage renal disease  -On dialysis Tuesday Thursday Saturday.  [facility] request renal consultation as she is due for dialysis in a.m.  DVT prophylaxis -Subcutaneous heparin  CODE STATUS -Full code   Time Spent on Admission: 75 minutes  HERNANDEZ [name],Shawna [facility] Pager: [phone] 09/08/2015, 6:06 PM

## 2015-09-08 NOTE — Progress Notes (Signed)
Mrs. Latrenda Temores arrived to unit rm# [id number]. Report received from [facility] ED, RN.

## 2015-09-08 NOTE — ED Provider Notes (Signed)
CSN: [id number]     Arrival date & time 09/08/15  1229 History   First MD Initiated Contact with Patient 09/08/15 1245     Chief Complaint  Patient presents with  . Tachycardia      HPI  Pt was seen at 1255. Per pt, c/o gradual onset and worsening of persistent chest "pain" and palpitations that she noticed this morning when she woke up. Describes the CP as "fullness," has been associated with SOB. Pt was evaluated in the ED yesterday for c/o generalized fatigue and "dark stools." Denies back pain, no abd pain, no N/V/D, no fevers.   Past Medical History  Diagnosis Date  . Chronic bronchitis ([place])   . GERD (gastroesophageal reflux disease)   . PUD (peptic ulcer disease)   . History of lower GI bleeding   . Arthritis   . History of gout   . CAD (coronary artery disease)     a. 12/2011 NSTEMI/Cath/PCI LCX (2.25x14 Resolute DES) & D1 (2.25x22 Resolute DES);  b. 01/2012 Cath/PCI: LM 30, LAD 30p, 40-74m, D1 stent ok, 99 in sm branch of diag, LCX patent stent, OM1 20, RCA 95 ost (4.0x12 Promus DES), EF 55%;  c. 04/2012 Lexi Cardiolite  EF 48%, small area of scar @ base/mid inflat wall with mild peri-infarct ischemia.; CABG 12/4  . High cholesterol 12/2011  . Pneumonia ~ 2009  . Iron deficiency anemia   . TIA (transient ischemic attack)   . Anxiety   . History of blood transfusion 07/2011; 12/2011; 01/2012 X 2; 04/2012  . Carotid artery disease ([place])     a. [id number] LICA, [id number]   . Mitral regurgitation     a. Moderate by echo, 02/2012  . Myocardial infarction ([place])   . Chronic diastolic CHF (congestive heart failure) ([place])     a. 02/2012 Echo EF 60-65%, nl wall motion, Gr 1 DD, mod MR  . Hypertension   . AVM (arteriovenous malformation) of colon   . Esophageal stricture   . Ovarian cancer ([place]) 1992  . Colon cancer ([place]) 1992  . ESRD on hemodialysis ([place])     ESRD due to HTN, started dialysis 2011 and gets HD at [facility] with Dr Hinda Lenis on MWF schedule.  Access is LUA AVF as of Sept  2014.    Past Surgical History  Procedure Laterality Date  . Abdominal hysterectomy  1992  . Appendectomy  06/1990  . Tubal ligation  1980's  . Av fistula placement  07/2009    left upper arm  . Thrombectomy / arteriovenous graft revision  2011    left upper arm  . Colon resection  1992  . Esophagogastroduodenoscopy  01/20/2012    Procedure: ESOPHAGOGASTRODUODENOSCOPY (EGD);  Surgeon: Ladene Artist, MD,FACG;  Location: [facility] ENDOSCOPY;  Service: Endoscopy;  Laterality: N/A;  . Dilation and curettage of uterus    . Coronary angioplasty with stent placement  12/15/11    "2"  . Coronary angioplasty with stent placement  y/2013    "1; makes total of 3" (05/02/2012)  . Coronary artery bypass graft  06/13/2012    Procedure: CORONARY ARTERY BYPASS GRAFTING (CABG);  Surgeon: Grace Isaac, MD;  Location: [place];  Service: Open Heart Surgery;  Laterality: N/A;  cabg x four;  using left internal mammary artery, and left leg greater saphenous vein harvested endoscopically  . Intraoperative transesophageal echocardiogram  06/13/2012    Procedure: INTRAOPERATIVE TRANSESOPHAGEAL ECHOCARDIOGRAM;  Surgeon: Grace Isaac, MD;  Location: [place];  Service:  Open Heart Surgery;  Laterality: N/A;  . Esophagogastroduodenoscopy N/A 03/26/2013    Procedure: ESOPHAGOGASTRODUODENOSCOPY (EGD);  Surgeon: Irene Shipper, MD;  Location: [facility] ENDOSCOPY;  Service: Endoscopy;  Laterality: N/A;  . [place]vary surgery      ovarian cancer  . Left heart catheterization with coronary angiogram N/A 12/15/2011    Procedure: LEFT HEART CATHETERIZATION WITH CORONARY ANGIOGRAM;  Surgeon: Burnell Blanks, MD;  Location: [facility] CATH LAB;  Service: Cardiovascular;  Laterality: N/A;  . Left heart catheterization with coronary angiogram N/A 01/10/2012    Procedure: LEFT HEART CATHETERIZATION WITH CORONARY ANGIOGRAM;  Surgeon: Peter M Martinique, MD;  Location: [facility] CATH LAB;  Service: Cardiovascular;  Laterality: N/A;  . Left heart catheterization with  coronary angiogram N/A 06/08/2012    Procedure: LEFT HEART CATHETERIZATION WITH CORONARY ANGIOGRAM;  Surgeon: Burnell Blanks, MD;  Location: [facility] CATH LAB;  Service: Cardiovascular;  Laterality: N/A;  . Shuntogram N/A 10/15/2013    Procedure: Fistulogram;  Surgeon: Serafina Mitchell, MD;  Location: [facility] CATH LAB;  Service: Cardiovascular;  Laterality: N/A;  . Left heart catheterization with coronary/graft angiogram N/A 12/10/2013    Procedure: LEFT HEART CATHETERIZATION WITH Beatrix Fetters;  Surgeon: [name], MD;  Location: [facility] CATH LAB;  Service: Cardiovascular;  Laterality: N/A;  . Esophagogastroduodenoscopy N/A 04/30/2015    Procedure: ESOPHAGOGASTRODUODENOSCOPY (EGD);  Surgeon: Rogene Houston, MD;  Location: [facility];  Service: Endoscopy;  Laterality: N/A;  1pm - moved to 10/20 @ 1:10   Family History  Problem Relation Age of Onset  . Other      noncontributory for early CAD  . Heart disease Mother     Heart Disease before age 66  . Hyperlipidemia Mother   . Hypertension Mother   . Diabetes Mother   . Heart attack Mother   . Heart disease Father     Heart Disease before age 56  . Hyperlipidemia Father   . Hypertension Father   . Diabetes Father   . Diabetes Sister   . Hypertension Sister   . Diabetes Brother   . Hyperlipidemia Brother   . Heart attack Brother   . Colon cancer Neg Hx   . Esophageal cancer Neg Hx   . Liver disease Neg Hx   . Kidney disease Neg Hx   . Colon polyps Neg Hx   . Hypertension Sister   . Heart attack Brother    Social History  Substance Use Topics  . Smoking status: Never Smoker   . Smokeless tobacco: Never Used  . Alcohol Use: No   OB History    Gravida Para Term Preterm AB TAB SAB Ectopic Multiple Living   2 2  2      2      Review of Systems ROS: Statement: All systems negative except as marked or noted in the HPI; Constitutional: Negative for fever and chills.. ; ; Eyes: Negative for eye pain, redness and  discharge. ; ; ENMT: Negative for ear pain, hoarseness, nasal congestion, sinus pressure and sore throat. ; ; Cardiovascular: +palpitations, CP, SOB. Negative for diaphoresis, and peripheral edema. ; ; Respiratory: Negative for cough, wheezing and stridor. ; ; Gastrointestinal: Negative for nausea, vomiting, diarrhea, abdominal pain, blood in stool, hematemesis, jaundice and rectal bleeding. . ; ; Genitourinary: Negative for dysuria, flank pain and hematuria. ; ; Musculoskeletal: Negative for back pain and neck pain. Negative for swelling and trauma.; ; Skin: Negative for pruritus, rash, abrasions, blisters, bruising and skin lesion.; ; Neuro:  Negative for headache, lightheadedness and neck stiffness. Negative for weakness, altered level of consciousness , altered mental status, extremity weakness, paresthesias, involuntary movement, seizure and syncope.      Allergies  Aspirin; Contrast media; Iron; Penicillins; Plavix; Amlodipine; Bactrim; Dexilant; Morphine and related; Nitrofurantoin; Prilosec; Venofer; Levaquin; and Protonix  Home Medications   Prior to Admission medications   Medication Sig Start Date End Date Taking? Authorizing Provider  ALPRAZolam Duanne Moron) 0.5 MG tablet Take 1 tablet (0.5 mg total) by mouth 3 (three) times daily. 01/27/15  Yes Theodis Blaze, MD  amLODipine (NORVASC) 2.5 MG tablet Take 1 tablet (2.5 mg total) by mouth daily. 07/28/15  Yes Herminio Commons, MD  aspirin EC 81 MG tablet Take 81 mg by mouth daily.    Yes Historical Provider, MD  benzonatate (TESSALON) 100 MG capsule Take 1 capsule (100 mg total) by mouth every 8 (eight) hours. 09/03/15  Yes Erline Hau, MD  cloNIDine (CATAPRES) 0.3 MG tablet Take 1 tablet (0.3 mg total) by mouth 2 (two) times daily. Patient taking differently: Take 0.3 mg by mouth daily. *May take an additional tablet based on blood pressure levels* 06/10/15  Yes Herminio Commons, MD  fluticasone (FLONASE) 50 MCG/ACT nasal spray  Place 2 sprays into the nose daily.    Yes Historical Provider, MD  folic acid-vitamin b complex-vitamin c-selenium-zinc (DIALYVITE) 3 MG TABS Take 1 tablet by mouth daily.   Yes Historical Provider, MD  hydrALAZINE (APRESOLINE) 50 MG tablet Take 2 tablets (100 mg total) by mouth 3 (three) times daily. 06/10/15  Yes Herminio Commons, MD  isosorbide dinitrate (ISORDIL) 30 MG tablet Take 1 tablet (30 mg total) by mouth 3 (three) times daily. 07/28/15  Yes Herminio Commons, MD  labetalol (NORMODYNE) 200 MG tablet Take 1.5 tablets (300 mg total) by mouth 2 (two) times daily. 06/17/15  Yes Herminio Commons, MD  lanthanum (FOSRENOL) 1000 MG chewable tablet Chew 1,000 mg by mouth 3 (three) times daily after meals.   Yes Historical Provider, MD  loratadine (CLARITIN) 10 MG tablet Take 10 mg by mouth daily.    Yes Historical Provider, MD  nitroGLYCERIN (NITROSTAT) 0.4 MG SL tablet Place 1 tablet (0.4 mg total) under the tongue every 5 (five) minutes x 3 doses as needed for chest pain. 06/23/14  Yes Herminio Commons, MD  omeprazole (PRILOSEC) 20 MG capsule Take 1 capsule (20 mg total) by mouth daily. 05/12/15  Yes Butch Penny, NP  ondansetron (ZOFRAN-ODT) 4 MG disintegrating tablet Take 4 mg by mouth every 8 (eight) hours as needed for nausea or vomiting. nausea 05/18/15  Yes Historical Provider, MD  oseltamivir (TAMIFLU) 30 MG capsule Take 1 capsule (30 mg total) by mouth every Monday, Wednesday, and Friday with hemodialysis. 08/31/15  Yes Samuella Cota, MD  PROAIR HFA 108 [phone] BASE) MCG/ACT inhaler Inhale 1 puff into the lungs every 6 (six) hours as needed for wheezing or shortness of breath.  01/09/15  Yes Historical Provider, MD  SENSIPAR 30 MG tablet Take 30 mg by mouth daily with supper.  12/21/12  Yes Historical Provider, MD  simvastatin (ZOCOR) 20 MG tablet TAKE ONE TABLET BY MOUTH AT BEDTIME 07/22/15  Yes Herminio Commons, MD  tetrahydrozoline 0.05 % ophthalmic solution Place 1 drop into  both eyes 2 (two) times daily as needed (irritation).   Yes Historical Provider, MD  acetaminophen (TYLENOL) 325 MG tablet Take 2 tablets (650 mg total) by mouth every 6 (six)  hours as needed for mild pain (mild pain). Patient not taking: Reported on 09/07/2015 10/02/14   Erline Hau, MD   BP 149/63 mmHg  Pulse 72  Temp(Src) 99.2 F (37.3 C) (Rectal)  Resp 15  Ht 5\' 1"  (1.549 m)  Wt 137 lb (62.143 kg)  BMI 25.90 kg/m2  SpO2 100%   Patient Vitals for the past 24 hrs:  BP Temp Temp src Pulse Resp SpO2 Height Weight  09/08/15 1400 149/63 mmHg - - 72 15 100 % - -  09/08/15 1352 149/61 mmHg - - 73 14 99 % - -  09/08/15 1348 - - - 75 19 99 % - -  09/08/15 1347 - - - (!) 130 17 99 % - -  09/08/15 1338 - 99.2 F (37.3 C) Rectal - - - - -  09/08/15 1242 156/79 mmHg 98.8 F (37.1 C) Oral (!) 143 19 98 % 5\' 1"  (1.549 m) 137 lb (62.143 kg)     Physical Exam  1300: Physical examination:  Nursing notes reviewed; Vital signs and O2 SAT reviewed;  Constitutional: Well developed, Well nourished, Well hydrated, Uncomfortable appearing; Head:  Normocephalic, atraumatic; Eyes: EOMI, PERRL, No scleral icterus; ENMT: Mouth and pharynx normal, Mucous membranes moist; Neck: Supple, Full range of motion, No lymphadenopathy; Cardiovascular: Tachycardic rate and rhythm, No gallop; Respiratory: Breath sounds clear & equal bilaterally, No wheezes.  Speaking full sentences with ease, Normal respiratory effort/excursion; Chest: Nontender, Movement normal; Abdomen: Soft, Nontender, Nondistended, Normal bowel sounds; Genitourinary: No CVA tenderness; Extremities: Pulses normal, No tenderness, +tr pedal edema bilat. No calf edema or asymmetry.; Neuro: AA&Ox3, Major CN grossly intact.  Speech clear. No gross focal motor or sensory deficits in extremities.; Skin: Color normal, Warm, Dry.   ED Course  Procedures (including critical care time) Labs Review  Imaging Review  I have personally reviewed and  evaluated these images and lab results as part of my medical decision-making.   EKG Interpretation   Date/Time:  Tuesday September 08 2015 12:39:58 EST Ventricular Rate:  143 PR Interval:    QRS Duration: 89 QT Interval:  313 QTC Calculation: 483 R Axis:   53 Text Interpretation:  Narrow QRS tachycardia Probable LVH with secondary  repol abnrm Nonspecific ST and T wave abnormality When compared with ECG  of 09/07/2015 Rate faster Confirmed by [facility]  MD, Phil Michels [phone]) on  09/08/2015 2:19:18 PM    EKG Interpretation  Date/Time:  Tuesday September 08 2015 14:14:41 EST Ventricular Rate:  77 PR Interval:  160 QRS Duration: 98 QT Interval:  427 QTC Calculation: 483 R Axis:   63 Text Interpretation:  Sinus rhythm Probable LVH with secondary repol abnrm Anterior Q waves, possibly due to LVH Nonspecific ST and T wave abnormality Normal sinus rhythm has replaced Narrow QRS tachycardia Since last tracing of earlier today Confirmed by [facility]  MD, Nunzio Cory [phone]) on 09/08/2015 2:22:26 PM        MDM  MDM Reviewed: previous chart, nursing note and vitals Reviewed previous: labs and ECG Interpretation: labs, ECG and x-ray Total time providing critical care: 30-74 minutes. This excludes time spent performing separately reportable procedures and services. Consults: admitting MD   CRITICAL CARE Performed by: Alfonzo Feller Total critical care time: 35 minutes Critical care time was exclusive of separately billable procedures and treating other patients. Critical care was necessary to treat or prevent imminent or life-threatening deterioration. Critical care was time spent personally by me on the following activities: development of treatment plan with  patient and/or surrogate as well as nursing, discussions with consultants, evaluation of patient's response to treatment, examination of patient, obtaining history from patient or surrogate, ordering and performing treatments and  interventions, ordering and review of laboratory studies, ordering and review of radiographic studies, pulse oximetry and re-evaluation of patient's condition.   Results for orders placed or performed during the hospital encounter of 09/08/15  Comprehensive metabolic panel  Result Value Ref Range   Sodium 138 135 - 145 mmol/L   Potassium 4.2 3.5 - 5.1 mmol/L   Chloride 99 (L) 101 - 111 mmol/L   CO2 29 22 - 32 mmol/L   Glucose, Bld 108 (H) 65 - 99 mg/dL   BUN 24 (H) 6 - 20 mg/dL   Creatinine, Ser 8.52 (H) 0.44 - 1.00 mg/dL   Calcium 8.9 8.9 - 10.3 mg/dL   Total Protein 6.3 (L) 6.5 - 8.1 g/dL   Albumin 3.3 (L) 3.5 - 5.0 g/dL   AST 26 15 - 41 U/L   ALT 18 14 - 54 U/L   Alkaline Phosphatase 113 38 - 126 U/L   Total Bilirubin 0.3 0.3 - 1.2 mg/dL   GFR calc non Af Amer 4 (L) >60 mL/min   GFR calc Af Amer 5 (L) >60 mL/min   Anion gap 10 5 - 15  Troponin I  Result Value Ref Range   Troponin I 0.12 (H) <0.031 ng/mL  CBC with Differential  Result Value Ref Range   WBC 6.4 4.0 - 10.5 K/uL   RBC 2.38 (L) 3.87 - 5.11 MIL/uL   Hemoglobin 7.7 (L) 12.0 - 15.0 g/dL   HCT 24.4 (L) 36.0 - 46.0 %   MCV 102.5 (H) 78.0 - 100.0 fL   MCH 32.4 26.0 - 34.0 pg   MCHC 31.6 30.0 - 36.0 g/dL   RDW 17.2 (H) 11.5 - 15.5 %   Platelets 288 150 - 400 K/uL   Neutrophils Relative % 68 %   Neutro Abs 4.4 1.7 - 7.7 K/uL   Lymphocytes Relative 12 %   Lymphs Abs 0.8 0.7 - 4.0 K/uL   Monocytes Relative 12 %   Monocytes Absolute 0.8 0.1 - 1.0 K/uL   Eosinophils Relative 7 %   Eosinophils Absolute 0.5 0.0 - 0.7 K/uL   Basophils Relative 1 %   Basophils Absolute 0.0 0.0 - 0.1 K/uL  I-stat Chem 8, ED  Result Value Ref Range   Sodium 137 135 - 145 mmol/L   Potassium 4.1 3.5 - 5.1 mmol/L   Chloride 97 (L) 101 - 111 mmol/L   BUN 25 (H) 6 - 20 mg/dL   Creatinine, Ser 8.30 (H) 0.44 - 1.00 mg/dL   Glucose, Bld 100 (H) 65 - 99 mg/dL   Calcium, Ion 1.07 (L) 1.13 - 1.30 mmol/L   TCO2 29 0 - 100 mmol/L   Hemoglobin  8.8 (L) 12.0 - 15.0 g/dL   HCT 26.0 (L) 36.0 - 46.0 %  Type and screen  Result Value Ref Range   ABO/RH(D) O POS    Antibody Screen NEG    Sample Expiration 09/11/2015    Dg Chest Port 1 View 09/08/2015  CLINICAL DATA:  Tachycardia, chest tightness and shortness of breath since this morning EXAM: PORTABLE CHEST 1 VIEW COMPARISON:  08/31/2015 FINDINGS: Previous coronary bypass noted. Native coronary stents present. Mild cardiomegaly without acute CHF or pneumonia. No focal collapse or consolidation. Negative for effusion or pneumothorax. Monitor leads overlie the chest. IMPRESSION: Mild cardiomegaly without acute process. Stable  postoperative findings. Electronically Signed   By: Jerilynn Mages.  Shick M.D.   On: 09/08/2015 13:50    Results for Shawna, MAZZUCA (MRN [id number]) as of 09/08/2015 14:25  Ref. Range 08/20/2015 14:51 08/30/2015 10:25 08/31/2015 06:47 08/31/2015 18:45 09/01/2015 04:44 09/02/2015 05:45 09/07/2015 17:00 09/08/2015 13:10 09/08/2015 13:18  Hemoglobin Latest Ref Range: 12.0-15.0 g/dL 9.3 (L) 8.7 (L) 7.5 (L) 8.2 (L) 7.9 (L) 7.8 (L) 7.6 (L) 7.7 (L) 8.8 (L)  HCT Latest Ref Range: 36.0-46.0 % 28.6 (L) 26.9 (L) 23.4 (L) 25.2 (L) 24.6 (L) 23.8 (L) 23.4 (L) 24.4 (L) 26.0 (L)    1435:  On arrival: pt's monitor with narrow complex tachycardia, rate 140-150's. IV cardizem bolus and gtt given with good effect. HR improved to 70's, monitor NSR and pt "feels better."  Troponin elevated, but pt c/o GI bleeding and endorses hx of allergy to ASA and plavix. BUN/Cr per ESRD on HD. H/H stable. Dx and testing d/w pt and family.  Questions answered.  Verb understanding, agreeable to observation admit. T/C to [facility]. Jerilee Hoh, case discussed, including:  HPI, pertinent PM/SHx, VS/PE, dx testing, ED course and treatment:  States she knows pt from her previous admission, agreeable to observation admit, requests to d/c cardizem gtt, write temporary orders, obtain tele bed to team [facility].    Francine Graven,  DO 09/11/15 0010

## 2015-09-08 NOTE — ED Notes (Signed)
Attempted to give report, RN unavailable.

## 2015-09-09 ENCOUNTER — Observation Stay (HOSPITAL_BASED_OUTPATIENT_CLINIC_OR_DEPARTMENT_OTHER): Payer: Medicare HMO

## 2015-09-09 ENCOUNTER — Other Ambulatory Visit (HOSPITAL_COMMUNITY): Payer: Medicare HMO

## 2015-09-09 DIAGNOSIS — I471 Supraventricular tachycardia: Secondary | ICD-10-CM | POA: Diagnosis not present

## 2015-09-09 DIAGNOSIS — N186 End stage renal disease: Secondary | ICD-10-CM | POA: Diagnosis not present

## 2015-09-09 DIAGNOSIS — I10 Essential (primary) hypertension: Secondary | ICD-10-CM | POA: Diagnosis not present

## 2015-09-09 DIAGNOSIS — R079 Chest pain, unspecified: Secondary | ICD-10-CM

## 2015-09-09 DIAGNOSIS — I2510 Atherosclerotic heart disease of native coronary artery without angina pectoris: Secondary | ICD-10-CM | POA: Diagnosis not present

## 2015-09-09 DIAGNOSIS — Z992 Dependence on renal dialysis: Secondary | ICD-10-CM

## 2015-09-09 DIAGNOSIS — D631 Anemia in chronic kidney disease: Secondary | ICD-10-CM | POA: Diagnosis not present

## 2015-09-09 LAB — BASIC METABOLIC PANEL
Anion gap: 11 (ref 5–15)
BUN: 32 mg/dL — ABNORMAL HIGH (ref 6–20)
CALCIUM: 8.4 mg/dL — AB (ref 8.9–10.3)
CO2: 27 mmol/L (ref 22–32)
CREATININE: 9.71 mg/dL — AB (ref 0.44–1.00)
Chloride: 102 mmol/L (ref 101–111)
GFR calc non Af Amer: 3 mL/min — ABNORMAL LOW (ref >60)
GFR, EST AFRICAN AMERICAN: 4 mL/min — AB (ref >60)
Glucose, Bld: 93 mg/dL (ref 65–99)
Potassium: 4 mmol/L (ref 3.5–5.1)
SODIUM: 140 mmol/L (ref 135–145)

## 2015-09-09 LAB — HEMOGLOBIN AND HEMATOCRIT, BLOOD
HEMATOCRIT: 21 % — AB (ref 36.0–46.0)
HEMATOCRIT: 36.5 % (ref 36.0–46.0)
HEMOGLOBIN: 6.6 g/dL — AB (ref 12.0–15.0)
Hemoglobin: 12.2 g/dL (ref 12.0–15.0)

## 2015-09-09 LAB — CBC
HCT: 20.4 % — ABNORMAL LOW (ref 36.0–46.0)
Hemoglobin: 6.2 g/dL — CL (ref 12.0–15.0)
MCH: 31.3 pg (ref 26.0–34.0)
MCHC: 30.4 g/dL (ref 30.0–36.0)
MCV: 103 fL — ABNORMAL HIGH (ref 78.0–100.0)
PLATELETS: 252 10*3/uL (ref 150–400)
RBC: 1.98 MIL/uL — AB (ref 3.87–5.11)
RDW: 17.5 % — AB (ref 11.5–15.5)
WBC: 5.2 10*3/uL (ref 4.0–10.5)

## 2015-09-09 LAB — PREPARE RBC (CROSSMATCH)

## 2015-09-09 LAB — TROPONIN I
TROPONIN I: 0.09 ng/mL — AB (ref <0.031)
Troponin I: 0.08 ng/mL — ABNORMAL HIGH (ref <0.031)

## 2015-09-09 MED ORDER — DIPHENHYDRAMINE HCL 25 MG PO CAPS
25.0000 mg | ORAL_CAPSULE | Freq: Once | ORAL | Status: AC
  Administered 2015-09-09: 25 mg via ORAL
  Filled 2015-09-09: qty 1

## 2015-09-09 MED ORDER — ACETAMINOPHEN 325 MG PO TABS: 650.0000 mg | ORAL_TABLET | Freq: Once | ORAL | Status: DC

## 2015-09-09 MED ORDER — PENTAFLUOROPROP-TETRAFLUOROETH EX AERO: 1.0000 "application " | INHALATION_SPRAY | CUTANEOUS | Status: DC | PRN

## 2015-09-09 MED ORDER — EPOETIN ALFA 10000 UNIT/ML IJ SOLN
10000.0000 [IU] | INTRAMUSCULAR | Status: DC
  Administered 2015-09-09: 10000 [IU] via INTRAVENOUS

## 2015-09-09 MED ORDER — SODIUM CHLORIDE 0.9 % IV SOLN: 100.0000 mL | INTRAVENOUS | Status: DC | PRN

## 2015-09-09 MED ORDER — SODIUM CHLORIDE 0.9 % IV SOLN: Freq: Once | INTRAVENOUS | Status: DC

## 2015-09-09 MED ORDER — LIDOCAINE HCL (PF) 1 % IJ SOLN: 5.0000 mL | INTRAMUSCULAR | Status: DC | PRN

## 2015-09-09 MED ORDER — EPOETIN ALFA 10000 UNIT/ML IJ SOLN: 10000.0000 [IU] | INTRAMUSCULAR | Status: DC

## 2015-09-09 MED ORDER — AMIODARONE HCL 200 MG PO TABS
200.0000 mg | ORAL_TABLET | Freq: Two times a day (BID) | ORAL | Status: DC
  Administered 2015-09-10: 200 mg via ORAL
  Filled 2015-09-09 (×2): qty 1

## 2015-09-09 MED ORDER — EPOETIN ALFA 10000 UNIT/ML IJ SOLN
INTRAMUSCULAR | Status: AC
  Administered 2015-09-09: 10000 [IU] via INTRAVENOUS
  Filled 2015-09-09: qty 1

## 2015-09-09 MED ORDER — HYDRALAZINE HCL 20 MG/ML IJ SOLN
5.0000 mg | INTRAMUSCULAR | Status: DC | PRN
Start: 2015-09-09 — End: 2015-09-10

## 2015-09-09 MED ORDER — SODIUM CHLORIDE 0.9 % IV SOLN: Freq: Once | INTRAVENOUS | Status: AC

## 2015-09-09 MED ORDER — FUROSEMIDE 10 MG/ML IJ SOLN: 20.0000 mg | Freq: Once | INTRAMUSCULAR | Status: DC

## 2015-09-09 MED ORDER — LIDOCAINE-PRILOCAINE 2.5-2.5 % EX CREA: 1.0000 "application " | TOPICAL_CREAM | CUTANEOUS | Status: DC | PRN

## 2015-09-09 NOTE — Consult Note (Signed)
Shawna [name] MRN: [id number] DOB/AGE: 01/17/1941 75 y.o. Primary Care Physician:HOWARD, Lennette Bihari, MD Admit date: 09/08/2015 Chief Complaint:  Chief Complaint  Patient presents with  . Tachycardia   HPI: Pt is 75 year old female withg past medical hx of ESRD who was presented to Er with c/o tachycardia  HPI dates back to yesterday when pt says " I felt funny and checked my heart ratre, it was as high as 141--150. Pt contacted the cardiologist who encouraged her to come to ER.. On Presentation to Er pt was was found again in the ED to have a narrow complex tachycardia, that quickly resolved with adenosine.  Pt today feels much better NO c/o dypnea No c/o fever/cough/chiils NO c/o nausea/vomiting NO c/o hematuria No c/o syncope No c/o recent travel. NO c/o blood in stools No c/o hematuria NO c/o black tarry stools.   Past Medical History  Diagnosis Date  . Chronic bronchitis ([place])   . GERD (gastroesophageal reflux disease)   . PUD (peptic ulcer disease)   . History of lower GI bleeding   . Arthritis   . History of gout   . CAD (coronary artery disease)     a. 12/2011 NSTEMI/Cath/PCI LCX (2.25x14 Resolute DES) & D1 (2.25x22 Resolute DES);  b. 01/2012 Cath/PCI: LM 30, LAD 30p, 40-19m, D1 stent ok, 99 in sm branch of diag, LCX patent stent, OM1 20, RCA 95 ost (4.0x12 Promus DES), EF 55%;  c. 04/2012 Lexi Cardiolite  EF 48%, small area of scar @ base/mid inflat wall with mild peri-infarct ischemia.; CABG 12/4  . High cholesterol 12/2011  . Pneumonia ~ 2009  . Iron deficiency anemia   . TIA (transient ischemic attack)   . Anxiety   . History of blood transfusion 07/2011; 12/2011; 01/2012 X 2; 04/2012  . Carotid artery disease ([place])     a. [id number] LICA, [id number]   . Mitral regurgitation     a. Moderate by echo, 02/2012  . Myocardial infarction ([place])   . Chronic diastolic CHF (congestive heart failure) ([place])     a. 02/2012 Echo EF 60-65%, nl wall motion, Gr 1 DD, mod MR  . Hypertension   .  AVM (arteriovenous malformation) of colon   . Esophageal stricture   . Ovarian cancer ([place]) 1992  . Colon cancer ([place]) 1992  . ESRD on hemodialysis ([place])     ESRD due to HTN, started dialysis 2011 and gets HD at [facility] with Dr Hinda Lenis on MWF schedule.  Access is LUA AVF as of Sept 2014.       Family History  Problem Relation Age of Onset  . Other      noncontributory for early CAD  . Heart disease Mother     Heart Disease before age 74  . Hyperlipidemia Mother   . Hypertension Mother   . Diabetes Mother   . Heart attack Mother   . Heart disease Father     Heart Disease before age 52  . Hyperlipidemia Father   . Hypertension Father   . Diabetes Father   . Diabetes Sister   . Hypertension Sister   . Diabetes Brother   . Hyperlipidemia Brother   . Heart attack Brother   . Colon cancer Neg Hx   . Esophageal cancer Neg Hx   . Liver disease Neg Hx   . Kidney disease Neg Hx   . Colon polyps Neg Hx   . Hypertension Sister   . Heart attack Brother  Social History:  reports that she has never smoked. She has never used smokeless tobacco. She reports that she does not drink alcohol or use illicit drugs.   Allergies:  Allergies  Allergen Reactions  . Aspirin Other (See Comments)    Mess up her stomach; "makes my bowels have blood in them". Takes 81 mg EC Aspirin   . Contrast Media [Iodinated Diagnostic Agents] Itching  . Iron Itching and Other (See Comments)    "they gave me iron in dialysis; had to give me Benadryl cause I had to have the iron" (05/02/2012)  . Penicillins Other (See Comments)    "makes me real weak when I take it; like I'll pass out"Has patient had a PCN reaction causing immediate rash, facial/tongue/throat swelling, SOB or lightheadedness with hypotension: Nono Has patient had a PCN reaction causing severe rash involving mucus membranes or skin necrosis: Nono Has patient had a PCN reaction that required hospitalization Nono Has patient had a PCN  reaction occurring within the last 10 years: Nono If all of the above  . Plavix [Clopidogrel Bisulfate] Rash  . Amlodipine Swelling    Leg swelling  . Bactrim [Sulfamethoxazole-Trimethoprim] Rash  . Dexilant [Dexlansoprazole] Other (See Comments)    Upset stomach  . Morphine And Related Itching    Itching in feet  . Nitrofurantoin Hives  . Prilosec [Omeprazole] Other (See Comments)    "back spasms"  . Venofer [Ferric Oxide] Itching    Patient reports using Benadryl prior to doses as [facility]  . Levaquin [Levofloxacin In D5w] Rash  . Protonix [Pantoprazole Sodium] Rash       Medications Prior to Admission  Medication Sig Dispense Refill  . ALPRAZolam (XANAX) 0.5 MG tablet Take 1 tablet (0.5 mg total) by mouth 3 (three) times daily. 30 tablet 1  . amLODipine (NORVASC) 2.5 MG tablet Take 1 tablet (2.5 mg total) by mouth daily. 30 tablet 6  . aspirin EC 81 MG tablet Take 81 mg by mouth daily.     . benzonatate (TESSALON) 100 MG capsule Take 1 capsule (100 mg total) by mouth every 8 (eight) hours. 21 capsule 0  . cloNIDine (CATAPRES) 0.3 MG tablet Take 1 tablet (0.3 mg total) by mouth 2 (two) times daily. (Patient taking differently: Take 0.3 mg by mouth daily. *May take an additional tablet based on blood pressure levels*) 60 tablet 6  . fluticasone (FLONASE) 50 MCG/ACT nasal spray Place 2 sprays into the nose daily.     . folic acid-vitamin b complex-vitamin c-selenium-zinc (DIALYVITE) 3 MG TABS Take 1 tablet by mouth daily.    . hydrALAZINE (APRESOLINE) 50 MG tablet Take 2 tablets (100 mg total) by mouth 3 (three) times daily. 540 tablet 1  . isosorbide dinitrate (ISORDIL) 30 MG tablet Take 1 tablet (30 mg total) by mouth 3 (three) times daily. 90 tablet 6  . labetalol (NORMODYNE) 200 MG tablet Take 1.5 tablets (300 mg total) by mouth 2 (two) times daily. 60 tablet 6  . lanthanum (FOSRENOL) 1000 MG chewable tablet Chew 1,000 mg by mouth 3 (three) times daily after meals.    Marland Kitchen  loratadine (CLARITIN) 10 MG tablet Take 10 mg by mouth daily.     . nitroGLYCERIN (NITROSTAT) 0.4 MG SL tablet Place 1 tablet (0.4 mg total) under the tongue every 5 (five) minutes x 3 doses as needed for chest pain. 25 tablet 3  . omeprazole (PRILOSEC) 20 MG capsule Take 1 capsule (20 mg total) by mouth daily. [place]  capsule 5  . ondansetron (ZOFRAN-ODT) 4 MG disintegrating tablet Take 4 mg by mouth every 8 (eight) hours as needed for nausea or vomiting. nausea    . oseltamivir (TAMIFLU) 30 MG capsule Take 1 capsule (30 mg total) by mouth every Monday, Wednesday, and Friday with hemodialysis. 2 capsule 0  . PROAIR HFA 108 (90 BASE) MCG/ACT inhaler Inhale 1 puff into the lungs every 6 (six) hours as needed for wheezing or shortness of breath.     . SENSIPAR 30 MG tablet Take 30 mg by mouth daily with supper.     . simvastatin (ZOCOR) 20 MG tablet TAKE ONE TABLET BY MOUTH AT BEDTIME 30 tablet 6  . tetrahydrozoline 0.05 % ophthalmic solution Place 1 drop into both eyes 2 (two) times daily as needed (irritation).    Marland Kitchen acetaminophen (TYLENOL) 325 MG tablet Take 2 tablets (650 mg total) by mouth every 6 (six) hours as needed for mild pain (mild pain). (Patient not taking: Reported on 09/07/2015)         [id number] from the symptoms mentioned above,there are no other symptoms referable to all systems reviewed.  . sodium chloride   Intravenous Once  . sodium chloride   Intravenous Once  . acetaminophen  650 mg Oral Once  . ALPRAZolam  0.5 mg Oral TID  . amiodarone  200 mg Oral BID  . amLODipine  2.5 mg Oral Daily  . aspirin EC  81 mg Oral Daily  . cinacalcet  30 mg Oral Q supper  . cloNIDine  0.3 mg Oral BID  . diphenhydrAMINE  25 mg Oral Once  . epoetin (EPOGEN/PROCRIT) injection  10,000 Units Subcutaneous Q M,W,F-HD  . furosemide  20 mg Intravenous Once  . heparin  5,000 Units Subcutaneous 3 times per day  . hydrALAZINE  100 mg Oral TID  . isosorbide dinitrate  30 mg Oral TID  . labetalol  300  mg Oral 3 times per day  . lanthanum  1,000 mg Oral TID PC  . loratadine  10 mg Oral Daily  . multivitamin  1 tablet Oral Daily  . omeprazole  20 mg Oral Daily  . oseltamivir  30 mg Oral Q M,W,F-HD  . simvastatin  20 mg Oral QHS  . sodium chloride flush  3 mL Intravenous Q12H  . sodium chloride flush  3 mL Intravenous Q12H      Physical Exam: Vital signs in last 24 hours: Temp:  [98.2 F (36.8 C)-99.2 F (37.3 C)] 98.2 F (36.8 C) (03/01 0542) Pulse Rate:  [72-143] 77 (03/01 0542) Resp:  [9-19] 16 (03/01 0542) BP: (145-216)/(61-87) 145/84 mmHg (03/01 0542) SpO2:  [96 %-100 %] 96 % (03/01 0542) Weight:  [137 lb (62.143 kg)-139 lb (63.05 kg)] 139 lb (63.05 kg) (02/28 1556) Weight change:  Last BM Date: 09/08/15  Intake/Output from previous day: 02/28 0701 - 03/01 0700 In: 240 [P.O.:240] Out: -      Physical Exam: General- pt is awake,alert, oriented to time place and person Resp- No acute REsp distress, rhonchi  at bases CVS- S1S2 regular in rate and rhythm GIT- BS+, soft, NT, ND EXT- NO LE Edema, NO Cyanosis CNS- CN 2-12 grossly intact. Moving all 4 extremities Psych- normal mood and affect Access-  AVF+ bruit .  Lab Results: CBC  Recent Labs  09/08/15 1310  09/09/15 0615 09/09/15 0917  WBC 6.4  --  5.2  --   HGB 7.7*  < > 6.2* 6.6*  HCT 24.4*  < >  20.4* 21.0*  PLT 288  --  252  --   < > = values in this interval not displayed.  BMET  Recent Labs  09/08/15 1310 09/08/15 1318 09/09/15 0615  NA 138 137 140  K 4.2 4.1 4.0  CL 99* 97* 102  CO2 29  --  27  GLUCOSE 108* 100* 93  BUN 24* 25* 32*  CREATININE 8.52* 8.30* 9.71*  CALCIUM 8.9  --  8.4*    MICRO Recent Results (from the past 240 hour(s))  MRSA PCR Screening     Status: None   Collection Time: 08/31/15 10:45 AM  Result Value Ref Range Status   MRSA by PCR NEGATIVE NEGATIVE Final    Comment:        The GeneXpert MRSA Assay (FDA approved for NASAL specimens only), is one component of  a comprehensive MRSA colonization surveillance program. It is not intended to diagnose MRSA infection nor to guide or monitor treatment for MRSA infections.   Culture, blood (Routine X 2) w Reflex to ID Panel     Status: None   Collection Time: 08/31/15  6:45 PM  Result Value Ref Range Status   Specimen Description BLOOD RIGHT ARM  Final   Special Requests BOTTLES DRAWN AEROBIC AND ANAEROBIC 6CC  Final   Culture NO GROWTH 6 DAYS  Final   Report Status 09/06/2015 FINAL  Final  Culture, blood (Routine X 2) w Reflex to ID Panel     Status: None   Collection Time: 08/31/15  6:50 PM  Result Value Ref Range Status   Specimen Description BLOOD RIGHT HAND  Final   Special Requests BOTTLES DRAWN AEROBIC ONLY 6CC  Final   Culture NO GROWTH 6 DAYS  Final   Report Status 09/06/2015 FINAL  Final      Lab Results  Component Value Date   PTH 709.7* 05/29/2013   CALCIUM 8.4* 09/09/2015   CAION 1.07* 09/08/2015   PHOS 7.0* 09/01/2015      Impression: 1)Renal ESRd on HD On MWF schedule Pt is to be  dialyzed today  2)HTN  BP  at goal   Medication- On calcium blockers On RAS blockers- On Alpha and beta Blockers  On Central Acting Sympatholytics-  3)Anemia in ESRD the goal fpr  HGb is  9--11. Pt HGb not at goal Will transfuse Will keep on epo   4)CKD Mineral-Bone Disorder Secondary Hyperparathyroidism present    On Cinacalcet Phosphorus at goal.   5)CVS-Admitted with palpitations. Diagnosed with SVT. Primary MD following  6)Electrolytes Normokalemic Normonatremic   7)Acid base Co2 at goal     Plan:  Will dilayze today Will use 3 k bath Will keep on epo Pt to be transfused 2 units with HD today    Hector Venne S 09/09/2015, 9:51 AM

## 2015-09-09 NOTE — Progress Notes (Signed)
Shawna [name] KPT:[id number] DOB: [birth year] DOA: 09/08/2015 PCP: [name], MD  Brief narrative: 75 y/o ? ESRD MWF [place]-Dr [facility]ef[facility]aduh-complication with graft 1.70.01 CAd s/p CABG-Nl Nuc stresss LVEF 57% 2.15.16 Last Cath 6.2.15-patent except Prox L circ Admit Pulm edema + HTN emergency 10.23-10.25.16 Melena noted 2/2 to duodenal AVM 04/30/15-basleine Hemoglobin 10-11 rnge GERD  Admit from home with recurrent tachy-arrythmia, narrow complex 150's-given Cardizem   Past medical history-As per Problem list Chart reviewed as below-   Consultants:  Cardiology   Nephrology  Procedures:  none  Antibiotics:  none   Subjective   Alert pleasant in nad currently tol diet No cp No further symp No sob   Objective    Interim History:   Telemetry: Sinus, R/t pehnomenon   Objective: Filed Vitals:   09/08/15 1556 09/08/15 1918 09/08/15 2241 09/09/15 0542  BP: 216/87 216/72 156/61 145/84  Pulse: 86  80 77  Temp: 98.8 F (37.1 C)  98.8 F (37.1 C) 98.2 F (36.8 C)  TempSrc: Oral  Oral Oral  Resp: 12  16 16   Height: 5' 1"  (1.549 m)     Weight: 63.05 kg (139 lb)     SpO2: 100%  99% 96%    Intake/Output Summary (Last 24 hours) at 09/09/15 1336 Last data filed at 09/08/15 1800  Gross per 24 hour  Intake    240 ml  Output      0 ml  Net    240 ml    Exam:  General: eomi ncat Cardiovascular: s1 s 2no m/r/g Respiratory:  Clear no added sound Abdomen:  Soft nt nd no rebound no gaurd Skin intact, no le edema Neuro neuro intact, moving all 4 limbs equally  Data Reviewed: Basic Metabolic Panel:  Recent Labs Lab 09/07/15 1700 09/08/15 1310 09/08/15 1318 09/09/15 0615  NA 133* 138 137 140  K 3.5 4.2 4.1 4.0  CL 94* 99* 97* 102  CO2 30 29  --  27  GLUCOSE 88 108* 100* 93  BUN 16 24* 25* 32*  CREATININE 5.69* 8.52* 8.30* 9.71*  CALCIUM 8.3* 8.9  --  8.4*   Liver Function Tests:  Recent Labs Lab 09/07/15 1700 09/08/15 1310  AST 23 26    ALT 16 18  ALKPHOS 103 113  BILITOT 0.2* 0.3  PROT 6.4* 6.3*  ALBUMIN 3.4* 3.3*   No results for input(s): LIPASE, AMYLASE in the last 168 hours. No results for input(s): AMMONIA in the last 168 hours. CBC:  Recent Labs Lab 09/07/15 1700 09/08/15 1310 09/08/15 1318 09/09/15 0615 09/09/15 0917  WBC 6.4 6.4  --  5.2  --   NEUTROABS  --  4.4  --   --   --   HGB 7.6* 7.7* 8.8* 6.2* 6.6*  HCT 23.4* 24.4* 26.0* 20.4* 21.0*  MCV 101.7* 102.5*  --  103.0*  --   PLT 271 288  --  252  --    Cardiac Enzymes:  Recent Labs Lab 09/08/15 1310 09/08/15 1819 09/08/15 2350 09/09/15 0615  TROPONINI 0.12* 0.12* 0.09* 0.08*   BNP: Invalid input(s): POCBNP CBG:  Recent Labs Lab 09/03/15 0322  GLUCAP 174*    Recent Results (from the past 240 hour(s))  MRSA PCR Screening     Status: None   Collection Time: 08/31/15 10:45 AM  Result Value Ref Range Status   MRSA by PCR NEGATIVE NEGATIVE Final    Comment:        The GeneXpert MRSA Assay (FDA approved  for NASAL specimens only), is one component of a comprehensive MRSA colonization surveillance program. It is not intended to diagnose MRSA infection nor to guide or monitor treatment for MRSA infections.   Culture, blood (Routine X 2) w Reflex to ID Panel     Status: None   Collection Time: 08/31/15  6:45 PM  Result Value Ref Range Status   Specimen Description BLOOD RIGHT ARM  Final   Special Requests BOTTLES DRAWN AEROBIC AND ANAEROBIC 6CC  Final   Culture NO GROWTH 6 DAYS  Final   Report Status 09/06/2015 FINAL  Final  Culture, blood (Routine X 2) w Reflex to ID Panel     Status: None   Collection Time: 08/31/15  6:50 PM  Result Value Ref Range Status   Specimen Description BLOOD RIGHT HAND  Final   Special Requests BOTTLES DRAWN AEROBIC ONLY [place]  Final   Culture NO GROWTH 6 DAYS  Final   Report Status 09/06/2015 FINAL  Final     Studies:              All Imaging reviewed and is as per above notation   Scheduled  Meds: . sodium chloride   Intravenous Once  . acetaminophen  650 mg Oral Once  . ALPRAZolam  0.5 mg Oral TID  . amiodarone  200 mg Oral BID  . amLODipine  2.5 mg Oral Daily  . aspirin EC  81 mg Oral Daily  . cinacalcet  30 mg Oral Q supper  . cloNIDine  0.3 mg Oral BID  . epoetin (EPOGEN/PROCRIT) injection  10,000 Units Intravenous Q M,W,F-HD  . furosemide  20 mg Intravenous Once  . heparin  5,000 Units Subcutaneous 3 times per day  . hydrALAZINE  100 mg Oral TID  . isosorbide dinitrate  30 mg Oral TID  . labetalol  300 mg Oral 3 times per day  . lanthanum  1,000 mg Oral TID PC  . loratadine  10 mg Oral Daily  . multivitamin  1 tablet Oral Daily  . omeprazole  20 mg Oral Daily  . oseltamivir  30 mg Oral Q M,W,F-HD  . simvastatin  20 mg Oral QHS  . sodium chloride flush  3 mL Intravenous Q12H  . sodium chloride flush  3 mL Intravenous Q12H   Continuous Infusions:    Assessment/Plan:  1. SVT-Cardiology managing.  Started AMIOdarone this admit.  Follow tsh/lft. Continue Labetalol 300 q8 2. ESRD-[place]-[facility]ef[facility]-MWF-appreciate nephrology input-for dilaysis today 3. Acute anemia-unclear cause-has occ nausea.  trasnfuse 2 u prbc with dialysis. No dark stool-no vomit blood.  Hemoccult stool.  If persists, consult GI-has h/o Hiatal hernia and Gastric AVM's in the past.  Needs Iron studies 3-4 weeks 4. Htn-cont Amlodipine 2.5, hydralazine 100 tid, Imdur 30 tid [?] defer dosing to cardiology. 5. CAd-as above 6. Recent FLU+-continue tamiflu till 3/2 then stiop 7. Elevated troponin-ty II MI-demand-trend flat.  As per cardiology 8. Met bone disease-Phos Binders and Caclium as per neprhology   Full code No family Inpatient pending resolution   Verneita Griffes, MD  [facility] Pager [phone] 09/09/2015, 1:36 PM

## 2015-09-09 NOTE — Care Management Note (Signed)
Case Management Note  Patient Details  Name: Shawna [name] MRN: [id number] Date of Birth: 12/14/1940  Subjective/Objective:    Spoke with patient who is from home alert and oriented , Dialysis patient from [facility] MWF. Patient denies any needs with transportation or medications.     No CM needs identified.         Action/Plan:  Home with self care. Expected Discharge Date:                  Expected Discharge Plan:  Home/Self Care  In-House Referral:     Discharge planning Services  CM Consult  Post Acute Care Choice:    Choice offered to:     DME Arranged:    DME Agency:     HH Arranged:    [place] Agency:     Status of Service:  Completed, signed off  Medicare Important Message Given:    Date Medicare IM Given:    Medicare IM give by:    Date Additional Medicare IM Given:    Additional Medicare Important Message give by:     If discussed at [place] of Stay Meetings, dates discussed:    Additional Comments:  Alvie Heidelberg, RN 09/09/2015, 1:02 PM

## 2015-09-09 NOTE — Care Management Obs Status (Signed)
[place] NOTIFICATION   Patient Details  Name: Shawna [name] MRN: [id number] Date of Birth: 08/22/1940   Medicare Observation Status Notification Given:  Yes    Alvie Heidelberg, RN 09/09/2015, 12:07 PM

## 2015-09-09 NOTE — Progress Notes (Signed)
Initial Nutrition Assessment  INTERVENTION:  High protein snack qhs due to increased protien needs  Assist with tray set up as needed and encourage 100% protein intake meals.  NUTRITION DIAGNOSIS:   Increased nutrient needs related to catabolic illness as evidenced by estimated needs.  GOAL:   Patient will meet greater than or equal to 90% of their needs  MONITOR:   PO intake, Labs, Weight trends, I & O's  REASON FOR ASSESSMENT:   Malnutrition Screening Tool    ASSESSMENT:   75 year old woman well-known to me for recent hospitalization for influenza during which she developed an SVT that quickly resolved with adenosine. She represents to the hospital today with above complaints and was found again in the ED to have a narrow complex tachycardia with rate in the 150s. She was started on a Cardizem bolus and drip and this resolved rapidly and she has since been taken off the Cardizem. In the ED she was also found to have a troponin of 0.12 and admission has been requested. She has a history of coronary artery disease status post CABG, hypertension, hyperlipidemia, chronic diastolic CHF and end-stage renal disease on hemodialysis   Pt appetite is getting better 50-100% per her report and meal intake data. She is a dialysis pt and has been for 5 years. Pt says her appetite was decreased while she had the flu recently. Her weight is stable up 3% today but is likely due to her pending dialysis tx scheduled for later today. At home she is disciplined with her fluid intake and is usually under her (32 oz daily) allowance. She is able to eat well enough to maintain her weight around 61.5 kg which  is her dry wt goal per [facility] dialysis center. Nutrition focused exam shows mild muscle wasting. No edema noted. Pt doesn't drink Nepro therefore will send high protein snack nightly. Labs: BUN-32, Cr 9.71, Hemoglobin 6.6  Medications: Renal vitamin, lasix daily  Diet Order:  Diet renal with fluid  restriction Fluid restriction:: 1200 mL Fluid; Room service appropriate?: Yes; Fluid consistency:: Thin  Skin:   no concerns noted  Last BM:   09/08/15   Height:   Ht Readings from Last 1 Encounters:  09/08/15 5\' 1"  (1.549 m)    Weight:   Wt Readings from Last 1 Encounters:  09/08/15 139 lb (63.05 kg)    Ideal Body Weight:  47.7 kg  BMI:  Body mass index is 26.28 kg/(m^2).  Estimated Nutritional Needs:   Kcal:  1860   Protein:  80 gr   Fluid:  1.2 liters daily   EDUCATION NEEDS:   Education needs addressed  Colman Cater MS,RD,CSG,LDN Office: [phone] Pager: [phone]

## 2015-09-09 NOTE — Procedures (Signed)
   HEMODIALYSIS TREATMENT NOTE:  3.75 hour heparin-free dialysis completed via left upper arm AVF (16g ante/retrograde). 4h tx ordered, but pt insisted, "I only run 3 hours!"  Goal met: 2.2 liters removed without interruption in ultrafiltration.  2 units PRBCs transfused with HD.  Pt became increasingly hypertensive during HD session.  She requested her Clonidine dose.  Order was written for hydralazine '5mg'$  IV but pt refused this, "It drops my pressure too fast."  Clonidine 0.'3mg'$  given at 1700.  HD ended at 1815 and BP was 210/99.  She is due for Isosorbide, Labetalol, amlodipine, and hydralazine po.  All blood was returned and hemostasis was achieved within 20 minutes.  Report called to Russ Halo, RN.  Total HD time: 3h 53mTotal intake: 1470cc (670cc blood + 800cc NS) Total output: 3642cc __________________________ Net UF = 2172cc  ARockwell Alexandria RN, CDN

## 2015-09-09 NOTE — Progress Notes (Signed)
CRITICAL VALUE ALERT  Critical value received:  Hgb 6.2  Date of notification:  09/09/14  Time of notification:  [id number]  Critical value read back:Yes.    Nurse who received alert:  Jerrye Noble RN  MD notified (1st page):  Dr. Verlon Au already aware - orders place for blood

## 2015-09-09 NOTE — Consult Note (Signed)
Reason for Consult: SVT Referring Physician: PTH, Erline Hau, MD Consulting cardiologist Dr. Satira Sark Cardiologist:Dr. Kate Sable  Shawna [name] is an 75 y.o. female patient with history of CAD with previous stents and ultimately CABG in 2013. Cath 12/10/13 patent left main, focal disease in the mid to distal LAD, patent diagonal, LIMA to the LAD was patent, severe stenosis of the proximal left circumflex stent, patent SVG to the circumflex, 70% proximal ramus stenosis, widely patent SVG to the ramus, occluded stent to the ostium of the RCA and widely patent SVG to the distal RCA. She had a normal nuclear cardiac stress test 08/25/14 EF 57%. 2-D echo in 2015 EF 55-60% with moderate LVH and grade 1 diastolic dysfunction with mild to moderate MR. She also has malignant hypertension, end-stage renal disease on hemodialysis, chronic diastolic heart failure and hyperlipidemia. She tends to have chest pain when her blood pressure is elevated. She saw Dr.Koneswaran 07/30/15 and amlodipine was started at 2.5 mg daily. She's previously had leg swelling on this.  Patient had a recent admission with influenza A on 09/01/14 and her discharge was delayed because she went in to SVT which converted with 1 dose of adenosine. She returned to the ER yesterday once again in SVT and converted with IV Cardizem. Her labetalol was increased to 300 mg 3 times a day.  Troponin 0.12, 0.12, 0.09, 0.08. Hemoglobin initially 7.7 but down to 6.4 today. EKG is reviewed from 09/02/15 SVT at 1 63 bpm with ST-T wave changes, EKG from 09/07/15 SVT at 128 bpm with nonspecific ST-T wave changes. Patient's husband thinks she had a fast rate last year while in the hospital. She denies any associated chest pain, dyspnea or presyncope. She just felt her heart racing and felt uncomfortable. She took a NTG hoping to slow her HR but it didn't help.  Past Medical History  Diagnosis Date  . Chronic bronchitis ([place])    . GERD (gastroesophageal reflux disease)   . PUD (peptic ulcer disease)   . History of lower GI bleeding   . Arthritis   . History of gout   . CAD (coronary artery disease)     a. 12/2011 NSTEMI/Cath/PCI LCX (2.25x14 Resolute DES) & D1 (2.25x22 Resolute DES);  b. 01/2012 Cath/PCI: LM 30, LAD 30p, 40-51m D1 stent ok, 99 in sm branch of diag, LCX patent stent, OM1 20, RCA 95 ost (4.0x12 Promus DES), EF 55%;  c. 04/2012 Lexi Cardiolite  EF 48%, small area of scar @ base/mid inflat wall with mild peri-infarct ischemia.; CABG 12/4  . High cholesterol 12/2011  . Pneumonia ~ 2009  . Iron deficiency anemia   . TIA (transient ischemic attack)   . Anxiety   . History of blood transfusion 07/2011; 12/2011; 01/2012 X 2; 04/2012  . Carotid artery disease ([facility]     a. 689-37%LICA, 93/4287  . Mitral regurgitation     a. Moderate by echo, 02/2012  . [place] ([facility] [place]   . Chronic diastolic CHF (congestive heart failure) ([place]     a. 02/2012 Echo EF 60-65%, nl wall motion, Gr 1 DD, mod MR  . Hypertension   . AVM (arteriovenous malformation) of colon   . Esophageal stricture   . [place]varian cancer ([facility] 1992  . Colon cancer ([facility] 1992  . ESRD on hemodialysis ([facility]     ESRD due to HTN, [facility] 2011 [facility].  Access is LUA AVF as of Sept 2014.     Past Surgical History  Procedure Laterality Date  . Abdominal hysterectomy  1992  . Appendectomy  06/1990  . Tubal ligation  1980's  . Av fistula placement  07/2009    left upper arm  . Thrombectomy / arteriovenous graft revision  2011    left upper arm  . Colon resection  1992  . Esophagogastroduodenoscopy  01/20/2012    Procedure: ESOPHAGOGASTRODUODENOSCOPY (EGD);  Surgeon: Ladene Artist, MD,FACG;  Location: [facility] ENDOSCOPY;  Service: Endoscopy;  Laterality: N/A;  . Dilation and curettage of uterus    . Coronary angioplasty with stent placement  12/15/11    "2"  . Coronary angioplasty with  stent placement  y/2013    "1; makes total of 3" (05/02/2012)  . Coronary artery bypass graft  06/13/2012    Procedure: CORONARY ARTERY BYPASS GRAFTING (CABG);  Surgeon: Grace Isaac, MD;  Location: [place];  Service: Open Heart Surgery;  Laterality: N/A;  cabg x four;  using left internal mammary artery, and left leg greater saphenous vein harvested endoscopically  . Intraoperative transesophageal echocardiogram  06/13/2012    Procedure: INTRAOPERATIVE TRANSESOPHAGEAL ECHOCARDIOGRAM;  Surgeon: Grace Isaac, MD;  Location: [place];  Service: Open Heart Surgery;  Laterality: N/A;  . Esophagogastroduodenoscopy N/A 03/26/2013    Procedure: ESOPHAGOGASTRODUODENOSCOPY (EGD);  Surgeon: Irene Shipper, MD;  Location: [facility] ENDOSCOPY;  Service: Endoscopy;  Laterality: N/A;  . Ovary surgery      ovarian cancer  . Left heart catheterization with coronary angiogram N/A 12/15/2011    Procedure: LEFT HEART CATHETERIZATION WITH CORONARY ANGIOGRAM;  Surgeon: Burnell Blanks, MD;  Location[facility] CATH LAB;  Service: Cardiovascular;  Laterality: N/A;  . Left heart catheterization with coronary angiogram N/A 01/10/2012    Procedure: LEFT HEART CATHETERIZATION WITH CORONARY ANGIOGRAM;  Surgeon: Peter M Martinique, MD;  Location: [facility] CATH LAB;  Service: Cardiovascular;  Laterality: N/A;  . Left heart catheterization with coronary angiogram N/A 06/08/2012    Procedure: LEFT HEART CATHETERIZATION WITH CORONARY ANGIOGRAM;  Surgeon: Burnell Blanks, MD;  Location: [facility] CATH LAB;  Service: Cardiovascular;  Laterality: N/A;  . Shuntogram N/A 10/15/2013    Procedure: Fistulogram;  Surgeon: Serafina Mitchell, MD;  Location: [facility] CATH LAB;  Service: Cardiovascular;  Laterality: N/A;  . Left heart catheterization with coronary/graft angiogram N/A 12/10/2013    Procedure: LEFT HEART CATHETERIZATION WITH Beatrix Fetters;  Surgeon: [name], MD;  Location: [facility] CATH LAB;  Service: Cardiovascular;  Laterality: N/A;  .  Esophagogastroduodenoscopy N/A 04/30/2015    Procedure: ESOPHAGOGASTRODUODENOSCOPY (EGD);  Surgeon: Rogene Houston, MD;  Location: [facility] ENDO SU[facility];  Service: Endoscopy;  Laterality: N/A;  1pm - moved to 10/20 @ 1:10    Family History  Problem Relation Age of Onset  . Other      noncontributory for early CAD  . Heart disease Mother     Heart Disease before age 36  . Hyperlipidemia Mother   . Hypertension Mother   . Diabetes Mother   . Heart attack Mother   . Heart disease Father     Heart Disease before age 40  . Hyperlipidemia Father   . Hypertension Father   . Diabetes Father   . Diabetes Sister   . Hypertension Sister   . Diabetes Brother   . Hyperlipidemia Brother   . Heart attack Brother   . Colon cancer Neg Hx   . Esophageal cancer  Neg Hx   . Liver disease Neg Hx   . Kidney disease Neg Hx   . Colon polyps Neg Hx   . Hypertension Sister   . Heart attack Brother     Social History:  reports that she has never smoked. She has never used smokeless tobacco. She reports that she does not drink alcohol or use illicit drugs.  Allergies:  Allergies  Allergen Reactions  . Aspirin Other (See Comments)    Mess up her stomach; "makes my bowels have blood in them". Takes 81 mg EC Aspirin   . Contrast Media [Iodinated Diagnostic Agents] Itching  . Iron Itching and Other (See Comments)    "they gave me iron in dialysis; had to give me Benadryl cause I had to have the iron" (05/02/2012)  . Penicillins Other (See Comments)    "makes me real weak when I take it; like I'll pass out"Has patient had a PCN reaction causing immediate rash, facial/tongue/throat swelling, SOB or lightheadedness with hypotension: Nono Has patient had a PCN reaction causing severe rash involving mucus membranes or skin necrosis: Nono Has patient had a PCN reaction that required hospitalization Nono Has patient had a PCN reaction occurring within the last 10 years: Nono If all of the above  . Plavix  [Clopidogrel Bisulfate] Rash  . Amlodipine Swelling    Leg swelling  . Bactrim [Sulfamethoxazole-Trimethoprim] Rash  . Dexilant [Dexlansoprazole] Other (See Comments)    Upset stomach  . Morphine And Related Itching    Itching in feet  . Nitrofurantoin Hives  . Prilosec [Omeprazole] Other (See Comments)    "back spasms"  . Venofer [Ferric Oxide] Itching    Patient reports using Benadryl prior to doses as [facility]  . Levaquin [Levofloxacin In D5w] Rash  . Protonix [Pantoprazole Sodium] Rash    Medications:  Scheduled Meds: . sodium chloride   Intravenous Once  . sodium chloride   Intravenous Once  . acetaminophen  650 mg Oral Once  . ALPRAZolam  0.5 mg Oral TID  . amiodarone  200 mg Oral BID  . amLODipine  2.5 mg Oral Daily  . aspirin EC  81 mg Oral Daily  . cinacalcet  30 mg Oral Q supper  . cloNIDine  0.3 mg Oral BID  . diphenhydrAMINE  25 mg Oral Once  . epoetin (EPOGEN/PROCRIT) injection  10,000 Units Subcutaneous Q M,W,F-HD  . furosemide  20 mg Intravenous Once  . heparin  5,000 Units Subcutaneous 3 times per day  . hydrALAZINE  100 mg Oral TID  . isosorbide dinitrate  30 mg Oral TID  . labetalol  300 mg Oral 3 times per day  . lanthanum  1,000 mg Oral TID PC  . loratadine  10 mg Oral Daily  . multivitamin  1 tablet Oral Daily  . omeprazole  20 mg Oral Daily  . oseltamivir  30 mg Oral Q M,W,F-HD  . simvastatin  20 mg Oral QHS  . sodium chloride flush  3 mL Intravenous Q12H  . sodium chloride flush  3 mL Intravenous Q12H     Results for orders placed or performed during the hospital encounter of 09/08/15 (from the past 48 hour(s))  Comprehensive metabolic panel     Status: Abnormal   Collection Time: 09/08/15  1:10 PM  Result Value Ref Range   Sodium 138 135 - 145 mmol/L   Potassium 4.2 3.5 - 5.1 mmol/L   Chloride 99 (L) 101 - 111 mmol/L   CO2 29  22 - 32 mmol/L   Glucose, Bld 108 (H) 65 - 99 mg/dL   BUN 24 (H) 6 - 20 mg/dL   Creatinine, Ser 8.52 (H)  0.44 - 1.00 mg/dL   Calcium 8.9 8.9 - 10.3 mg/dL   Total Protein 6.3 (L) 6.5 - 8.1 g/dL   Albumin 3.3 (L) 3.5 - 5.0 g/dL   AST 26 15 - 41 U/L   ALT 18 14 - 54 U/L   Alkaline Phosphatase 113 38 - 126 U/L   Total Bilirubin 0.3 0.3 - 1.2 mg/dL   GFR calc non Af Amer 4 (L) >60 mL/min   GFR calc Af Amer 5 (L) >60 mL/min    Comment: (NOTE) The eGFR has been calculated using the CKD EPI equation. This calculation has not been validated in all clinical situations. eGFR's persistently <60 mL/min signify possible Chronic Kidney Disease.    Anion gap 10 5 - 15  Troponin I     Status: Abnormal   Collection Time: 09/08/15  1:10 PM  Result Value Ref Range   Troponin I 0.12 (H) <0.031 ng/mL    Comment:        PERSISTENTLY INCREASED TROPONIN VALUES IN THE RANGE OF 0.04-0.49 ng/mL CAN BE SEEN IN:       -UNSTABLE ANGINA       -CONGESTIVE HEART FAILURE       -MYOCARDITIS       -CHEST TRAUMA       -ARRYHTHMIAS       -LATE PRESENTING MYOCARDIAL INFARCTION       -COPD   CLINICAL FOLLOW-UP RECOMMENDED.   CBC with Differential     Status: Abnormal   Collection Time: 09/08/15  1:10 PM  Result Value Ref Range   WBC 6.4 4.0 - 10.5 K/uL   RBC 2.38 (L) 3.87 - 5.11 MIL/uL   Hemoglobin 7.7 (L) 12.0 - 15.0 g/dL   HCT 24.4 (L) 36.0 - 46.0 %   MCV 102.5 (H) 78.0 - 100.0 fL   MCH 32.4 26.0 - 34.0 pg   MCHC 31.6 30.0 - 36.0 g/dL   RDW 17.2 (H) 11.5 - 15.5 %   Platelets 288 150 - 400 K/uL   Neutrophils Relative % 68 %   Neutro Abs 4.4 1.7 - 7.7 K/uL   Lymphocytes Relative 12 %   Lymphs Abs 0.8 0.7 - 4.0 K/uL   Monocytes Relative 12 %   Monocytes Absolute 0.8 0.1 - 1.0 K/uL   Eosinophils Relative 7 %   Eosinophils Absolute 0.5 0.0 - 0.7 K/uL   Basophils Relative 1 %   Basophils Absolute 0.0 0.0 - 0.1 K/uL  Type and screen     Status: None   Collection Time: 09/08/15  1:10 PM  Result Value Ref Range   ABO/RH(D) O POS    Antibody Screen NEG    Sample Expiration 09/11/2015   I-stat Chem 8, ED      Status: Abnormal   Collection Time: 09/08/15  1:18 PM  Result Value Ref Range   Sodium 137 135 - 145 mmol/L   Potassium 4.1 3.5 - 5.1 mmol/L   Chloride 97 (L) 101 - 111 mmol/L   BUN 25 (H) 6 - 20 mg/dL   Creatinine, Ser 8.30 (H) 0.44 - 1.00 mg/dL   Glucose, Bld 100 (H) 65 - 99 mg/dL   Calcium, Ion 1.07 (L) 1.13 - 1.30 mmol/L   TCO2 29 0 - 100 mmol/L   Hemoglobin 8.8 (L) 12.0 - 15.0 g/dL  HCT 26.0 (L) 36.0 - 46.0 %  TSH     Status: None   Collection Time: 09/08/15  6:19 PM  Result Value Ref Range   TSH 0.999 0.350 - 4.500 uIU/mL  Troponin I     Status: Abnormal   Collection Time: 09/08/15  6:19 PM  Result Value Ref Range   Troponin I 0.12 (H) <0.031 ng/mL    Comment:        PERSISTENTLY INCREASED TROPONIN VALUES IN THE RANGE OF 0.04-0.49 ng/mL CAN BE SEEN IN:       -UNSTABLE ANGINA       -CONGESTIVE HEART FAILURE       -MYOCARDITIS       -CHEST TRAUMA       -ARRYHTHMIAS       -LATE PRESENTING MYOCARDIAL INFARCTION       -COPD   CLINICAL FOLLOW-UP RECOMMENDED.   Troponin I     Status: Abnormal   Collection Time: 09/08/15 11:50 PM  Result Value Ref Range   Troponin I 0.09 (H) <0.031 ng/mL    Comment:        PERSISTENTLY INCREASED TROPONIN VALUES IN THE RANGE OF 0.04-0.49 ng/mL CAN BE SEEN IN:       -UNSTABLE ANGINA       -CONGESTIVE HEART FAILURE       -MYOCARDITIS       -CHEST TRAUMA       -ARRYHTHMIAS       -LATE PRESENTING MYOCARDIAL INFARCTION       -COPD   CLINICAL FOLLOW-UP RECOMMENDED.   Basic metabolic panel     Status: Abnormal   Collection Time: 09/09/15  6:15 AM  Result Value Ref Range   Sodium 140 135 - 145 mmol/L   Potassium 4.0 3.5 - 5.1 mmol/L   Chloride 102 101 - 111 mmol/L   CO2 27 22 - 32 mmol/L   Glucose, Bld 93 65 - 99 mg/dL   BUN 32 (H) 6 - 20 mg/dL   Creatinine, Ser 9.71 (H) 0.44 - 1.00 mg/dL   Calcium 8.4 (L) 8.9 - 10.3 mg/dL   GFR calc non Af Amer 3 (L) >60 mL/min   GFR calc Af Amer 4 (L) >60 mL/min    Comment: (NOTE) The eGFR  has been calculated using the CKD EPI equation. This calculation has not been validated in all clinical situations. eGFR's persistently <60 mL/min signify possible Chronic Kidney Disease.    Anion gap 11 5 - 15  CBC     Status: Abnormal   Collection Time: 09/09/15  6:15 AM  Result Value Ref Range   WBC 5.2 4.0 - 10.5 K/uL   RBC 1.98 (L) 3.87 - 5.11 MIL/uL   Hemoglobin 6.2 (LL) 12.0 - 15.0 g/dL    Comment: RESULT REPEATED AND VERIFIED CRITICAL RESULT CALLED TO, READ BACK BY AND VERIFIED WITH: MEGAN BULLINS RN ON [id number] AT [id number] BY RESSEGGER R    HCT 20.4 (L) 36.0 - 46.0 %   MCV 103.0 (H) 78.0 - 100.0 fL   MCH 31.3 26.0 - 34.0 pg   MCHC 30.4 30.0 - 36.0 g/dL   RDW 17.5 (H) 11.5 - 15.5 %   Platelets 252 150 - 400 K/uL  Troponin I     Status: Abnormal   Collection Time: 09/09/15  6:15 AM  Result Value Ref Range   Troponin I 0.08 (H) <0.031 ng/mL    Comment:        PERSISTENTLY INCREASED TROPONIN VALUES IN THE  RANGE OF 0.04-0.49 ng/mL CAN BE SEEN IN:       -UNSTABLE ANGINA       -CONGESTIVE HEART FAILURE       -MYOCARDITIS       -CHEST TRAUMA       -ARRYHTHMIAS       -LATE PRESENTING MYOCARDIAL INFARCTION       -COPD   CLINICAL FOLLOW-UP RECOMMENDED.   Prepare RBC     Status: None   Collection Time: 09/09/15  8:29 AM  Result Value Ref Range   Order Confirmation ORDER PROCESSED BY BLOOD BANK   Hemoglobin and Hematocrit     Status: Abnormal   Collection Time: 09/09/15  9:17 AM  Result Value Ref Range   Hemoglobin 6.6 (LL) 12.0 - 15.0 g/dL    Comment: RESULT REPEATED AND VERIFIED CRITICAL RESULT CALLED TO, READ BACK BY AND VERIFIED WITH: MEGAN BULLINS RN ON [id number] AT [id number] BY RESSEGGER R    HCT 21.0 (L) 36.0 - 46.0 %    Dg Chest Port 1 View  09/08/2015  CLINICAL DATA:  Tachycardia, chest tightness and shortness of breath since this morning EXAM: PORTABLE CHEST 1 VIEW COMPARISON:  08/31/2015 FINDINGS: Previous coronary bypass noted. Native coronary stents present. Mild  cardiomegaly without acute CHF or pneumonia. No focal collapse or consolidation. Negative for effusion or pneumothorax. Monitor leads overlie the chest. IMPRESSION: Mild cardiomegaly without acute process. Stable postoperative findings. Electronically Signed   By: Jerilynn Mages.  Shick M.D.   On: 09/08/2015 13:50    ROS  See HPI Eyes: Negative Ears:Negative for hearing loss, tinnitus Cardiovascular: Negative for chest pain, dyspnea, dyspnea on exertion, near-syncope, orthopnea, paroxysmal nocturnal dyspnea and syncope,edema, claudication, cyanosis,.  Respiratory:   Negative for cough, hemoptysis, shortness of breath, sleep disturbances due to breathing, sputum production and wheezing.   Endocrine: Negative for cold intolerance and heat intolerance.  Hematologic/Lymphatic: Negative for adenopathy and bleeding problem. Does not bruise/bleed easily.  Musculoskeletal: Negative.   Gastrointestinal: Negative for nausea, vomiting, reflux, abdominal pain, diarrhea, constipation.   Genitourinary: Negative for bladder incontinence, dysuria, flank pain, frequency, hematuria, hesitancy, nocturia and urgency.  Neurological: Negative.  Allergic/Immunologic: Negative for environmental allergies.  Blood pressure 145/84, pulse 77, temperature 98.2 F (36.8 C), temperature source Oral, resp. rate 16, height 5' 1"  (1.549 m), weight 139 lb (63.05 kg), SpO2 96 %. Physical Exam PHYSICAL EXAM: Well-nournished, in no acute distress. Neck: No JVD, HJR, Bruit, or thyroid enlargement Lungs: No tachypnea, clear without wheezing, rales, or rhonchi Cardiovascular: RRR, PMI not displaced, 2/6 systolic murmur apex, no gallops, bruit, thrill, or heave. Abdomen: BS normal. Soft without organomegaly, masses, lesions or tenderness. Extremities: without cyanosis, clubbing or edema. Good distal pulses bilateral SKin: Warm, no lesions or rashes  Musculoskeletal: No deformities Neuro: no focal signs  1. Stress test:  Normal nuclear  cardiac stress test on 08/25/14, LVEF 57%.  2. Cardiac Cath: 12/10/2013 Patent left main coronary artery, focal significant disease in the mid to distal left anterior descending artery, patent diagonal branch, LIMA to LAD was widely patent, severe restenosis of the proximal left circumflex artery stent, patent SVG to circumflex, 70% proximal ramus stenosis, widely patent SVG to ramus, occluded stent at the ostium of the right coronary artery, and widely patent SVG to distal RCA.  3. Echocardiogram 11/27/2013 Left ventricle: The cavity size was normal. Wall thickness was   increased in a pattern of moderate LVH. Systolic function was   normal. The estimated ejection fraction was in the range  of 55%   to 60%. Wall motion was normal; there were no regional wall   motion abnormalities. Doppler parameters are consistent with   abnormal left ventricular relaxation (grade 1 diastolic   dysfunction). Doppler parameters are consistent with high   ventricular filling pressure. - Aortic valve: Mildly calcified annulus. Trileaflet; mildly   thickened leaflets. There was no stenosis. - Mitral valve: Mildly thickened leaflets . There was mild to   moderate, posteriorly directed regurgitation. - Left atrium: The atrium was mildly dilated. - Tricuspid valve: There was mild regurgitation. - Pulmonary arteries: PA peak pressure: 41 mm Hg (S). Mildly   elevated pulmonary pressures.  4. CABG 06/13/2012 CORONARY ARTERY BYPASS GRAFTING x 4 (Left internal mammary artery to left anterior descending, saphenous vein graft to distal right coronary artery, saphenous vein graft to intermediate, saphenous vein graft to circumflex) ENDOSCOPIC VEIN HARVEST LEFT LEG -  Assessment/Plan: SVT: Twice in 1 week converted with adenosine 6 mg and now converted with diltiazem. First time was in the setting of influenza A and now with significant anemia and hypertension. Probably will need amiodarone for long term treatment, adjust  labetolol down as needed. EP evaluation as outpatient can also be considered.  CAD status post CABG with follow-up cath 12/10/13 as described above, normal nuclear stress test 08/25/14 EF 57%. Troponins flat, suspect demand ischemia with SVT and lack of symptoms.  Chronic diastolic heart failure: controlled with dialysis.  Malignant hypertension  Amlodipine recently restarted in the clinic at 2.5 mg daily. Has had swelling on amlodipine in the past.  Elevated troponins probably secondary to demand ischemia with SVT  ESRD on HD  History of carotid stenosis less than 40% bilaterally 08/14/14  Mild to moderate mitral regurgitation  Anemia with Hbg 6.2, needs GI w/u   Ermalinda Barrios PA-C  09/09/2015, 9:54 AM    Attending note:  Patient seen and examined. Reviewed extensive records on this medically complex patient. Agree with above assessment by Ms. Bonnell Public PA-C. Shawna [name] presents with recurrent SVT, second episode within the last few weeks, symptomatic with chest discomfort, palpitations, and shortness of breath. Cardiac history includes multivessel CAD status post CABG, malignant hypertension, she also has a history of ESRD on hemodialysis, colonic AVMs.   On examination this morning patient is comfortable, no specific complaints. Heart rate in the 70s in sinus rhythm by telemetry. Systolic blood pressure 309M to 150s. Lungs are clear without labored breathing. Cardiac exam reveals RRR with 2/6 systolic murmur. No peripheral edema. Lab work reviewed with creatinine 9.7, potassium 4.0, troponin I 0.12 down to 0.09 down to 0.08, hemoglobin 6.2, platelets 252. I personally reviewed her recent tracings from February 28, initially showing SVT 143 bpm with LVH and repolarization abnormalities, subsequently after adenosine back to sinus rhythm with similar changes otherwise. Chest x-ray shows mild cardiomegaly without edema. Recent TSH normal at 0.9, AST and ALT also normal.  Patient presenting with  recurrent symptomatic SVT in the setting of chronic cardiac disease as outlined above. Minor elevation of troponin I is not likely secondary to ACS, rather demand ischemia in the setting of rapid heart rate. It is not clear that further ischemic testing is needed at this time. Although precipitants for SVT could have potentially been initially influenza, and now more acute anemia, she remains at risk for recurring symptomatic arrhythmias, and therefore we discussed initiation of amiodarone for rhythm suppression. Recent TSH, AST, and ALT normal. Outpatient referral for EP consultation can also be considered for the potential  of a catheter-based ablation, but patient did voice some hesitation about this at present. We will initiate amiodarone 200 mg twice daily for 2 weeks with plan to go down to once daily thereafter. She will otherwise stay on aspirin, Norvasc, clonidine, hydralazine, Imdur, and labetalol. She will need to see Dr. Bronson Ing back in the office over the next 2 weeks.  Satira Sark, M.D., F.A.C.C.

## 2015-09-10 DIAGNOSIS — I2510 Atherosclerotic heart disease of native coronary artery without angina pectoris: Secondary | ICD-10-CM | POA: Diagnosis not present

## 2015-09-10 DIAGNOSIS — Z992 Dependence on renal dialysis: Secondary | ICD-10-CM | POA: Diagnosis not present

## 2015-09-10 DIAGNOSIS — R7989 Other specified abnormal findings of blood chemistry: Secondary | ICD-10-CM | POA: Diagnosis not present

## 2015-09-10 DIAGNOSIS — I471 Supraventricular tachycardia: Secondary | ICD-10-CM | POA: Diagnosis not present

## 2015-09-10 DIAGNOSIS — N186 End stage renal disease: Secondary | ICD-10-CM | POA: Diagnosis not present

## 2015-09-10 DIAGNOSIS — I10 Essential (primary) hypertension: Secondary | ICD-10-CM | POA: Diagnosis not present

## 2015-09-10 DIAGNOSIS — D631 Anemia in chronic kidney disease: Secondary | ICD-10-CM | POA: Diagnosis not present

## 2015-09-10 LAB — TYPE AND SCREEN
ABO/RH(D): O POS
Antibody Screen: NEGATIVE
Donor AG Type: NEGATIVE
Donor AG Type: NEGATIVE
Unit division: 0
Unit division: 0

## 2015-09-10 LAB — IRON AND TIBC
Iron: 52 ug/dL (ref 28–170)
Saturation Ratios: 25 % (ref 10.4–31.8)
TIBC: 211 ug/dL — ABNORMAL LOW (ref 250–450)
UIBC: 159 ug/dL

## 2015-09-10 LAB — FERRITIN: Ferritin: 779 ng/mL — ABNORMAL HIGH (ref 11–307)

## 2015-09-10 MED ORDER — AMIODARONE HCL 200 MG PO TABS: 200.0000 mg | ORAL_TABLET | Freq: Two times a day (BID) | ORAL | Status: DC

## 2015-09-10 MED ORDER — AMIODARONE HCL 200 MG PO TABS: 200.0000 mg | ORAL_TABLET | Freq: Every day | ORAL | Status: DC

## 2015-09-10 NOTE — Progress Notes (Signed)
Shawna [name]  MRN: [id number]  DOB/AGE: June 18, 1941 75 y.o.  Primary Care Physician:HOWARD, Lennette Bihari, MD  Admit date: 09/08/2015  Chief Complaint:  Chief Complaint  Patient presents with  . Tachycardia    S-Pt presented on  09/08/2015 with  Chief Complaint  Patient presents with  . Tachycardia  .    Pt today feels much better.    Pt says " i may be going home today"   Meds . sodium chloride   Intravenous Once  . acetaminophen  650 mg Oral Once  . ALPRAZolam  0.5 mg Oral TID  . amiodarone  200 mg Oral BID  . amLODipine  2.5 mg Oral Daily  . aspirin EC  81 mg Oral Daily  . cinacalcet  30 mg Oral Q supper  . cloNIDine  0.3 mg Oral BID  . epoetin (EPOGEN/PROCRIT) injection  10,000 Units Intravenous Q M,W,F-HD  . furosemide  20 mg Intravenous Once  . heparin  5,000 Units Subcutaneous 3 times per day  . hydrALAZINE  100 mg Oral TID  . isosorbide dinitrate  30 mg Oral TID  . labetalol  300 mg Oral 3 times per day  . lanthanum  1,000 mg Oral TID PC  . loratadine  10 mg Oral Daily  . multivitamin  1 tablet Oral Daily  . omeprazole  20 mg Oral Daily  . oseltamivir  30 mg Oral Q M,W,F-HD  . simvastatin  20 mg Oral QHS  . sodium chloride flush  3 mL Intravenous Q12H  . sodium chloride flush  3 mL Intravenous Q12H      Physical Exam: Vital signs in last 24 hours: Temp:  [98.2 F (36.8 C)-98.9 F (37.2 C)] 98.9 F (37.2 C) (03/01 2047) Pulse Rate:  [69-86] 78 (03/01 2208) Resp:  [16-20] 20 (03/01 2047) BP: (120-235)/(46-114) 140/46 mmHg (03/01 2208) SpO2:  [97 %] 97 % (03/01 2047) Weight:  [134 lb 14.7 oz (61.2 kg)] 134 lb 14.7 oz (61.2 kg) (03/01 1420) Weight change: -2 lb 1.3 oz (-0.943 kg) Last BM Date: 09/08/15  Intake/Output from previous day: 03/01 0701 - 03/02 0700 In: 670 [Blood:670] Out: 2172      Physical Exam: General- pt is awake follows commands Resp- No acute REsp distress, chest is clear to auscultation. CVS- S1S2 regular in rate and rhythm GIT-  BS+, soft, NT, ND EXT- NO LE Edema, NO Cyanosis Access -left AVF +  Lab Results: CBC  Recent Labs  09/08/15 1310  09/09/15 0615 09/09/15 0917 09/09/15 1821  WBC 6.4  --  5.2  --   --   HGB 7.7*  < > 6.2* 6.6* 12.2  HCT 24.4*  < > 20.4* 21.0* 36.5  PLT 288  --  252  --   --   < > = values in this interval not displayed.  BMET  Recent Labs  09/08/15 1310 09/08/15 1318 09/09/15 0615  NA 138 137 140  K 4.2 4.1 4.0  CL 99* 97* 102  CO2 29  --  27  GLUCOSE 108* 100* 93  BUN 24* 25* 32*  CREATININE 8.52* 8.30* 9.71*  CALCIUM 8.9  --  8.4*    MICRO Recent Results (from the past 240 hour(s))  MRSA PCR Screening     Status: None   Collection Time: 08/31/15 10:45 AM  Result Value Ref Range Status   MRSA by PCR NEGATIVE NEGATIVE Final    Comment:        The GeneXpert MRSA Assay (FDA  approved for NASAL specimens only), is one component of a comprehensive MRSA colonization surveillance program. It is not intended to diagnose MRSA infection nor to guide or monitor treatment for MRSA infections.   Culture, blood (Routine X 2) w Reflex to ID Panel     Status: None   Collection Time: 08/31/15  6:45 PM  Result Value Ref Range Status   Specimen Description BLOOD RIGHT ARM  Final   Special Requests BOTTLES DRAWN AEROBIC AND ANAEROBIC 6CC  Final   Culture NO GROWTH 6 DAYS  Final   Report Status 09/06/2015 FINAL  Final  Culture, blood (Routine X 2) w Reflex to ID Panel     Status: None   Collection Time: 08/31/15  6:50 PM  Result Value Ref Range Status   Specimen Description BLOOD RIGHT HAND  Final   Special Requests BOTTLES DRAWN AEROBIC ONLY 6CC  Final   Culture NO GROWTH 6 DAYS  Final   Report Status 09/06/2015 FINAL  Final      Lab Results  Component Value Date   PTH 709.7* 05/29/2013   CALCIUM 8.4* 09/09/2015   CAION 1.07* 09/08/2015   PHOS 7.0* 09/01/2015       Impression: 1)Renal ESRd on HD On MWF schedule Pt was  dialyzed yesterday NO need of HD  today   2)HTN BP at goal  Medication- On calcium blockers On RAS blockers- On Alpha and beta Blockers  On Central Acting Sympatholytics-  3)Anemia in ESRD the goal for HGb is 9--11. Pt HGb now much better Pt was transfused yesterday  on epo during HD   4)CKD Mineral-Bone Disorder Secondary Hyperparathyroidism present  On Cinacalcet Phosphorus at goal.   5)[facility]-Admitted with palpitations. Diagnosed with SVT. Primary MD following  6)Electrolytes Normokalemic Normonatremic   7)Acid base Co2 at goal    Plan:  NO need of Hd today.  Cabell Lazenby S 09/10/2015, 5:48 AM

## 2015-09-10 NOTE — Discharge Summary (Signed)
Physician Discharge Summary  Shawna [name] ESP:[id number] DOB: 08/18/1940 DOA: 09/08/2015  PCP: Shawna Percy, MD  Admit date: 09/08/2015 Discharge date: 09/10/2015  Time spent: 35 minutes  Recommendations for Outpatient Follow-up:  1. Recommend complete blood count, Chem-12 in about 2 weeks at primary care physician's office 2. Recommend TSH in about 2 months as initiated amiodarone this admission 3. Recommend close follow-up with cardiology regarding further adjustment of medications-patient has very labile blood pressures and is on large doses of antihypertensives and may need further workup regarding secondary causes of hypertension 4. May need interval follow-up with gastroenterologist Dr. Jearld [name] dependent on hemoglobin levels. She was transfused during this admission without source that was overt for bleeding 5. Recommend continue dialysis as per schedule   Discharge Diagnoses:  Principal Problem:   Narrow complex tachycardia ([place]) Active Problems:   Coronary atherosclerosis of native coronary artery   Essential hypertension, benign   ESRD on hemodialysis (HCC)   SVT (supraventricular tachycardia) (HCC)   Discharge Condition: Good  Diet recommendation: Heart healthy  Filed Weights   09/08/15 1242 09/08/15 1556 09/09/15 1420  Weight: 62.143 kg (137 lb) 63.05 kg (139 lb) 61.2 kg (134 lb 14.7 oz)    History of present illness:  75 y/o ? ESRD MWF [place]-Dr Befakaduh-complication with graft 6.33.35 CAd s/p CABG-Nl Nuc stresss LVEF 57% 2.15.16 Last Cath 6.2.15-patent except Prox L circ Admit Pulm edema + HTN emergency 10.23-10.25.16 Melena noted 2/2 to duodenal AVM 04/30/15-basleine Hemoglobin 10-11 rnge GERD  Admit from home with recurrent tachy-arrythmia, narrow complex 150's-given Cardizem  Hospital Course:  1. SVT-Cardiology managing. Started AMIOdarone this admit 200 mg twice a day-->200 mg daily dose change on 3/15. TSH this admission 0.99, LFTs normal  Continue Labetalol 300 q8 2. ESRD-[place]-Befakad[facility]-MWF-appreciate nephrology patient was dialyzed this admission 3 hours 2.2 L. Outpatient follow-up needed. 3. Labile hypertension-patient on numerous medications inclusive of amlodipine, hydralazine, Imdur, labetalol. Consider workup for secondary causes of hypertension as well 4. Acute anemia-unclear cause-has occ nausea. trasnfuse 2 u prbc with dialysis. No dark stool-no vomit blood. Hemoccult stool not performed as patient did not have any stool at all. has h/o Hiatal hernia and Gastric AVM's in the past. Needs Iron studies 3-4 weeks-we will cc patient's cardiologist regarding need for potential workup 5. Htn-cont Amlodipine 2.5, hydralazine 100 tid, Imdur 30 tid [?] defer dosing to cardiology. 6. CAD-as above 7. Recent FLU+-continue tamiflu till 3/2 then stop 8. Elevated troponin-ty II MI-demand-trend flat. As per cardiology 9. Met bone disease-Phos Binders and Caclium as per neprhology  Procedures:  none  Consultations:  Cardiology  Discharge Exam: Filed Vitals:   09/10/15 0845 09/10/15 1021  BP: 134/46 154/61  Pulse: 87 71  Temp: 97.6 F (36.4 C)   Resp: 16     General: eomi ncat Cardiovascular: s1 s2 no m/r/g Respiratory: clear no added sound  Discharge Instructions   Discharge Instructions    Diet - low sodium heart healthy    Complete by:  As directed      Discharge instructions    Complete by:  As directed   You have been started on a new medication to control her heart rate and atrial fibrillation which should not really affect her blood pressure too much This is called amiodarone and you be taking 200 mg of this twice a day until 09/23/2015 and then you will take 200 mg once a day from that day forward until you can be seen by a cardiologist. We are not sure why  her hemoglobin dropped during this admission but it may be a low or a slow grade bleed from unknown abnormality in her stomach. Because we do not see any  overt bleeding, we will allow you to follow-up with Dr. Jearld [name] as an outpatient and I will copy him on this note as he may want to perform an elective endoscopy. Please get a check of your blood count within the next week and we can follow-up as an outpatient     Increase activity slowly    Complete by:  As directed           Current Discharge Medication List    START taking these medications   Details  !! amiodarone (PACERONE) 200 MG tablet Take 1 tablet (200 mg total) by mouth 2 (two) times daily. Qty: 26 tablet, Refills: 0    !! amiodarone (PACERONE) 200 MG tablet Take 1 tablet (200 mg total) by mouth daily. Qty: 30 tablet, Refills: 0     !! - Potential duplicate medications found. Please discuss with provider.    CONTINUE these medications which have NOT CHANGED   Details  ALPRAZolam (XANAX) 0.5 MG tablet Take 1 tablet (0.5 mg total) by mouth 3 (three) times daily. Qty: 30 tablet, Refills: 1    amLODipine (NORVASC) 2.5 MG tablet Take 1 tablet (2.5 mg total) by mouth daily. Qty: 30 tablet, Refills: 6    aspirin EC 81 MG tablet Take 81 mg by mouth daily.     benzonatate (TESSALON) 100 MG capsule Take 1 capsule (100 mg total) by mouth every 8 (eight) hours. Qty: 21 capsule, Refills: 0    cloNIDine (CATAPRES) 0.3 MG tablet Take 1 tablet (0.3 mg total) by mouth 2 (two) times daily. Qty: 60 tablet, Refills: 6    fluticasone (FLONASE) 50 MCG/ACT nasal spray Place 2 sprays into the nose daily.     folic acid-vitamin b complex-vitamin c-selenium-zinc (DIALYVITE) 3 MG TABS Take 1 tablet by mouth daily.    hydrALAZINE (APRESOLINE) 50 MG tablet Take 2 tablets (100 mg total) by mouth 3 (three) times daily. Qty: 540 tablet, Refills: 1    isosorbide dinitrate (ISORDIL) 30 MG tablet Take 1 tablet (30 mg total) by mouth 3 (three) times daily. Qty: 90 tablet, Refills: 6    labetalol (NORMODYNE) 200 MG tablet Take 1.5 tablets (300 mg total) by mouth 2 (two) times daily. Qty: 60  tablet, Refills: 6    lanthanum (FOSRENOL) 1000 MG chewable tablet Chew 1,000 mg by mouth 3 (three) times daily after meals.    loratadine (CLARITIN) 10 MG tablet Take 10 mg by mouth daily.     nitroGLYCERIN (NITROSTAT) 0.4 MG SL tablet Place 1 tablet (0.4 mg total) under the tongue every 5 (five) minutes x 3 doses as needed for chest pain. Qty: 25 tablet, Refills: 3    omeprazole (PRILOSEC) 20 MG capsule Take 1 capsule (20 mg total) by mouth daily. Qty: 30 capsule, Refills: 5    ondansetron (ZOFRAN-ODT) 4 MG disintegrating tablet Take 4 mg by mouth every 8 (eight) hours as needed for nausea or vomiting. nausea    PROAIR HFA 108 (90 BASE) MCG/ACT inhaler Inhale 1 puff into the lungs every 6 (six) hours as needed for wheezing or shortness of breath.     SENSIPAR 30 MG tablet Take 30 mg by mouth daily with supper.     simvastatin (ZOCOR) 20 MG tablet TAKE ONE TABLET BY MOUTH AT BEDTIME Qty: 30 tablet, Refills: 6  tetrahydrozoline 0.05 % ophthalmic solution Place 1 drop into both eyes 2 (two) times daily as needed (irritation).    acetaminophen (TYLENOL) 325 MG tablet Take 2 tablets (650 mg total) by mouth every 6 (six) hours as needed for mild pain (mild pain).      STOP taking these medications     oseltamivir (TAMIFLU) 30 MG capsule        Allergies  Allergen Reactions  . Aspirin Other (See Comments)    Mess up her stomach; "makes my bowels have blood in them". Takes 81 mg EC Aspirin   . Contrast Media [Iodinated Diagnostic Agents] Itching  . Iron Itching and Other (See Comments)    "they gave me iron in dialysis; had to give me Benadryl cause I had to have the iron" (05/02/2012)  . Penicillins Other (See Comments)    "makes me real weak when I take it; like I'll pass out"Has patient had a PCN reaction causing immediate rash, facial/tongue/throat swelling, SOB or lightheadedness with hypotension: Nono Has patient had a PCN reaction causing severe rash involving mucus  membranes or skin necrosis: Nono Has patient had a PCN reaction that required hospitalization Nono Has patient had a PCN reaction occurring within the last 10 years: Nono If all of the above  . Plavix [Clopidogrel Bisulfate] Rash  . Amlodipine Swelling    Leg swelling  . Bactrim [Sulfamethoxazole-Trimethoprim] Rash  . Dexilant [Dexlansoprazole] Other (See Comments)    Upset stomach  . Morphine And Related Itching    Itching in feet  . Nitrofurantoin Hives  . Prilosec [Omeprazole] Other (See Comments)    "back spasms"  . Venofer [Ferric Oxide] Itching    Patient reports using Benadryl prior to doses as [facility]  . Levaquin [Levofloxacin In D5w] Rash  . Protonix [Pantoprazole Sodium] Rash   Follow-up Information    Follow up with Rosaria Ferries, PA-C On 09/25/2015.   Specialties:  Cardiology, Radiology   Why:  at 1:00 pm in the [facility] office   Contact information:   [place] [phone]        The results of significant diagnostics from this hospitalization (including imaging, microbiology, ancillary and laboratory) are listed below for reference.    Significant Diagnostic Studies: Dg Chest 2 View  08/30/2015  CLINICAL DATA:  Insert epic study EXAM: CHEST  2 VIEW COMPARISON:  06/07/2015 FINDINGS: Status post median sternotomy and CABG. The heart is mildly enlarged. There are no focal consolidations pleural effusions. No pulmonary edema. Contrast is identified within nondilated loops of colon. IMPRESSION: 1. Mild cardiomegaly. 2.  No evidence for acute cardiopulmonary abnormality. 3. Retained contrast. Electronically Signed   By: Nolon Nations M.D.   On: 08/30/2015 10:19   Ct Head Wo Contrast  08/31/2015  CLINICAL DATA:  Fatigue and weakness for 2 days. EXAM: CT HEAD WITHOUT CONTRAST TECHNIQUE: Contiguous axial images were obtained from the base of the skull through the vertex without intravenous contrast. COMPARISON:  MRI from  03/03/2015.  Head CT from 12/16/2012. FINDINGS: There is no evidence for acute hemorrhage, hydrocephalus, mass lesion, or abnormal extra-axial fluid collection. No definite CT evidence for acute infarction. Patchy low attenuation in the deep hemispheric and periventricular white matter is nonspecific, but likely reflects chronic microvascular ischemic demyelination. Lacunar infarct is seen in the left basal ganglia. Air-fluid level is identified in the left sphenoid sinus. The remaining visualized paranasal sinuses are clear. No evidence for fluid in the mastoid  air cells. IMPRESSION: 1. No acute intracranial abnormality. 2. Chronic small vessel white matter ischemic disease. 3. Air-fluid level in the left sphenoid sinus. Imaging features are compatible with acute sinusitis. Electronically Signed   By: Misty Stanley M.D.   On: 08/31/2015 20:01   Dg Chest Port 1 View  09/08/2015  CLINICAL DATA:  Tachycardia, chest tightness and shortness of breath since this morning EXAM: PORTABLE CHEST 1 VIEW COMPARISON:  08/31/2015 FINDINGS: Previous coronary bypass noted. Native coronary stents present. Mild cardiomegaly without acute CHF or pneumonia. No focal collapse or consolidation. Negative for effusion or pneumothorax. Monitor leads overlie the chest. IMPRESSION: Mild cardiomegaly without acute process. Stable postoperative findings. Electronically Signed   By: Jerilynn Mages.  Shick M.D.   On: 09/08/2015 13:50   Dg Chest Port 1 View  08/31/2015  CLINICAL DATA:  Acute encephalopathy. EXAM: PORTABLE CHEST 1 VIEW COMPARISON:  08/30/2015 FINDINGS: 2016 hours. Interval increase in interstitial opacity. No focal airspace consolidation. No overt airspace pulmonary edema or pleural effusion. The cardio pericardial silhouette is enlarged. Patient is status post CABG. The visualized bony structures of the thorax are intact. IMPRESSION: Cardiomegaly with interval increase in interstitial opacity, likely edema given the rapid onset.  Electronically Signed   By: Misty Stanley M.D.   On: 08/31/2015 20:39    Microbiology: Recent Results (from the past 240 hour(s))  MRSA PCR Screening     Status: None   Collection Time: 08/31/15 10:45 AM  Result Value Ref Range Status   MRSA by PCR NEGATIVE NEGATIVE Final    Comment:        The GeneXpert MRSA Assay (FDA approved for NASAL specimens only), is one component of a comprehensive MRSA colonization surveillance program. It is not intended to diagnose MRSA infection nor to guide or monitor treatment for MRSA infections.   Culture, blood (Routine X 2) w Reflex to ID Panel     Status: None   Collection Time: 08/31/15  6:45 PM  Result Value Ref Range Status   Specimen Description BLOOD RIGHT ARM  Final   Special Requests BOTTLES DRAWN AEROBIC AND ANAEROBIC 6CC  Final   Culture NO GROWTH 6 DAYS  Final   Report Status 09/06/2015 FINAL  Final  Culture, blood (Routine X 2) w Reflex to ID Panel     Status: None   Collection Time: 08/31/15  6:50 PM  Result Value Ref Range Status   Specimen Description BLOOD RIGHT HAND  Final   Special Requests BOTTLES DRAWN AEROBIC ONLY [facility]  Final   Culture NO GROWTH 6 DAYS  Final   Report Status 09/06/2015 FINAL  Final     Labs: Basic Metabolic Panel:  Recent Labs Lab 09/07/15 1700 09/08/15 1310 09/08/15 1318 09/09/15 0615  NA 133* 138 137 140  K 3.5 4.2 4.1 4.0  CL 94* 99* 97* 102  CO2 30 29  --  27  GLUCOSE 88 108* 100* 93  BUN 16 24* 25* 32*  CREATININE 5.69* 8.52* 8.30* 9.71*  CALCIUM 8.3* 8.9  --  8.4*   Liver Function Tests:  Recent Labs Lab 09/07/15 1700 09/08/15 1310  AST 23 26  ALT 16 18  ALKPHOS 103 113  BILITOT 0.2* 0.3  PROT 6.4* 6.3*  ALBUMIN 3.4* 3.3*   No results for input(s): LIPASE, AMYLASE in the last 168 hours. No results for input(s): AMMONIA in the last 168 hours. CBC:  Recent Labs Lab 09/07/15 1700 09/08/15 1310 09/08/15 1318 09/09/15 0615 09/09/15 7096 09/09/15 1821  WBC 6.4 6.4   --  5.2  --   --   NEUTROABS  --  4.4  --   --   --   --   HGB 7.6* 7.7* 8.8* 6.2* 6.6* 12.2  HCT 23.4* 24.4* 26.0* 20.4* 21.0* 36.5  MCV 101.7* 102.5*  --  103.0*  --   --   PLT 271 288  --  252  --   --    Cardiac Enzymes:  Recent Labs Lab 09/08/15 1310 09/08/15 1819 09/08/15 2350 09/09/15 0615  TROPONINI 0.12* 0.12* 0.09* 0.08*   BNP: BNP (last 3 results)  Recent Labs  05/04/15 0518  BNP 1041.0*    ProBNP (last 3 results) No results for input(s): PROBNP in the last 8760 hours.  CBG: No results for input(s): GLUCAP in the last 168 hours.     SignedNita Sells MD   [facility] 09/10/2015, 10:39 AM

## 2015-09-10 NOTE — Progress Notes (Signed)
Patient discharged home.  IV removed - WNL.  Reviewed DC instructions and medications.  Follow up in place with PCP and cardio.  Verbalizes understanding.  No questions at this time.  Stable to DC home once ride arrives.

## 2015-09-10 NOTE — Progress Notes (Signed)
Primary Cardiologist: Dr. Kate Sable  Subjective:  No chest pain or SOB- feels much better after transfusion  Objective:  Vital Signs in the last 24 hours: Temp:  [98.2 F (36.8 C)-98.9 F (37.2 C)] 98.7 F (37.1 C) (03/02 0650) Pulse Rate:  [69-86] 69 (03/02 0650) Resp:  [16-20] 20 (03/02 0650) BP: (120-235)/(46-114) 150/83 mmHg (03/02 0650) SpO2:  [97 %-98 %] 98 % (03/02 0650) Weight:  [134 lb 14.7 oz (61.2 kg)] 134 lb 14.7 oz (61.2 kg) (03/01 1420)  Intake/Output from previous day:  Intake/Output Summary (Last 24 hours) at 09/10/15 0852 Last data filed at 09/09/15 1815  Gross per 24 hour  Intake    670 ml  Output   2172 ml  Net  -1502 ml    Physical Exam: General appearance: alert, cooperative and no distress Lungs: decreased Lt base, otherwise clear Heart: regular rate and rhythm Skin: Skin color, texture, turgor normal. No rashes or lesions Neurologic: Grossly normal   Rate: 78  Rhythm: normal sinus rhythm  Lab Results:  Recent Labs  09/08/15 1310  09/09/15 0615 09/09/15 0917 09/09/15 1821  WBC 6.4  --  5.2  --   --   HGB 7.7*  < > 6.2* 6.6* 12.2  PLT 288  --  252  --   --   < > = values in this interval not displayed.  Recent Labs  09/08/15 1310 09/08/15 1318 09/09/15 0615  NA 138 137 140  K 4.2 4.1 4.0  CL 99* 97* 102  CO2 29  --  27  GLUCOSE 108* 100* 93  BUN 24* 25* 32*  CREATININE 8.52* 8.30* 9.71*    Recent Labs  09/08/15 2350 09/09/15 0615  TROPONINI 0.09* 0.08*    Recent Labs  09/07/15 1700  INR 1.01    Scheduled Meds: . sodium chloride   Intravenous Once  . acetaminophen  650 mg Oral Once  . ALPRAZolam  0.5 mg Oral TID  . amiodarone  200 mg Oral BID  . amLODipine  2.5 mg Oral Daily  . aspirin EC  81 mg Oral Daily  . cinacalcet  30 mg Oral Q supper  . cloNIDine  0.3 mg Oral BID  . epoetin (EPOGEN/PROCRIT) injection  10,000 Units Intravenous Q M,W,F-HD  . furosemide  20 mg Intravenous Once  . heparin  5,000  Units Subcutaneous 3 times per day  . hydrALAZINE  100 mg Oral TID  . isosorbide dinitrate  30 mg Oral TID  . labetalol  300 mg Oral 3 times per day  . lanthanum  1,000 mg Oral TID PC  . loratadine  10 mg Oral Daily  . multivitamin  1 tablet Oral Daily  . omeprazole  20 mg Oral Daily  . oseltamivir  30 mg Oral Q M,W,F-HD  . simvastatin  20 mg Oral QHS  . sodium chloride flush  3 mL Intravenous Q12H  . sodium chloride flush  3 mL Intravenous Q12H   Continuous Infusions:  PRN Meds:.sodium chloride, sodium chloride, sodium chloride, acetaminophen **OR** acetaminophen, albuterol, hydrALAZINE, lidocaine (PF), lidocaine-prilocaine, nitroGLYCERIN, ondansetron **OR** ondansetron (ZOFRAN) IV, pentafluoroprop-tetrafluoroeth, senna-docusate, sodium chloride flush   Imaging: Dg Chest Port 1 View  09/08/2015  CLINICAL DATA:  Tachycardia, chest tightness and shortness of breath since this morning EXAM: PORTABLE CHEST 1 VIEW COMPARISON:  08/31/2015 FINDINGS: Previous coronary bypass noted. Native coronary stents present. Mild cardiomegaly without acute CHF or pneumonia. No focal collapse or consolidation. Negative for effusion or pneumothorax. Monitor leads overlie  the chest. IMPRESSION: Mild cardiomegaly without acute process. Stable postoperative findings. Electronically Signed   By: Jerilynn Mages.  Shick M.D.   On: 09/08/2015 13:50    Cardiac Studies: Echo 09/09/15 Study Conclusions  - Left ventricle: The cavity size was normal. Wall thickness was increased in a pattern of mild LVH. Systolic function was normal. The estimated ejection fraction was in the range of 60% to 65%. Wall motion was normal; there were no regional wall motion abnormalities. Features are consistent with a pseudonormal left ventricular filling pattern, with concomitant abnormal relaxation and increased filling pressure (grade 2 diastolic dysfunction). - Mitral valve: Mildly thickened leaflets . There was  moderate regurgitation. - Left atrium: The atrium was mildly dilated. - Right atrium: Central venous pressure (est): 8 mm Hg. - Tricuspid valve: There was mild regurgitation. - Pulmonary arteries: PA peak pressure: 45 mm Hg (S). - Pericardium, extracardiac: There was no pericardial effusion.  Impressions:  - Mild LVH with LVEF 60-65%. Grade 2 diastolic dysfunction with increased LV filling pressure. Mild left atrial enlargement. Mildly thickened mitral leaflets with moderate mitral regurgitation. Mild tricuspid regurgitation with PASP 45 mmHg.  Assessment/Plan:   Principal Problem: PSVT (supraventricular tachycardia) ([place]) Active Problems:   CABG 2013, cath June 2015, medical Rx, Myoview low risk Feb 15th 2016   Essential hypertension, HCVD- normal LVF, mild LVH   ESRD on hemodialysis (HCC)   Chronic Anemia- transfused   PLAN: We will arrange f/u with Dr Bronson Ing in two weeks. Decrease Amiodarone to 200 mg daily in two weeks. No need for anticoagulation with PSVT.   Kerin Ransom PA-C 09/10/2015, 8:52 AM [phone]  Attending note:  Patient seen and examined. Agree with above assessment by Mr. Reino Bellis. Cardiac rhythm is stable, no recurrent PSVT so far. She was started on amiodarone yesterday. Also transfused PRBCs by primary team with significant improvement in hemoglobin. She feels better overall.  On examination she appears comfortable. Heart rate in the 60s to 80s in sinus rhythm, systolic blood pressure Q000111Q to 150s. Lungs are clear and nonlabored. Cardiac exam with RRR and no gallop. Lab work shows hemoglobin up to 12.2.  Stable for discharge home from cardiac perspective. Would continue amiodarone 200 mg twice daily for total of 2 weeks with plan to go to once daily thereafter. Otherwise continue current cardiac regimen. Follow-up ECG today. Scheduling office follow-up with Dr. Bronson Ing or APP in about 2 weeks.  Satira Sark, M.D., F.A.C.C.

## 2015-09-11 DIAGNOSIS — Z992 Dependence on renal dialysis: Secondary | ICD-10-CM | POA: Diagnosis not present

## 2015-09-11 DIAGNOSIS — N186 End stage renal disease: Secondary | ICD-10-CM | POA: Diagnosis not present

## 2015-09-14 DIAGNOSIS — N186 End stage renal disease: Secondary | ICD-10-CM | POA: Diagnosis not present

## 2015-09-14 DIAGNOSIS — Z992 Dependence on renal dialysis: Secondary | ICD-10-CM | POA: Diagnosis not present

## 2015-09-14 IMAGING — US US ABDOMEN COMPLETE
1 series · 13 of 25 positions shown · non-contrast
Comparison: CT abdomen pelvis of [DATE]

CLINICAL DATA: Epigastric abdominal pain, hypertension, chronic
kidney disease

EXAM:
ULTRASOUND ABDOMEN COMPLETE

[Series 1: us abdomen complete · 0.14mm/px · 13 of 113 slices shown]
[im 1/113]
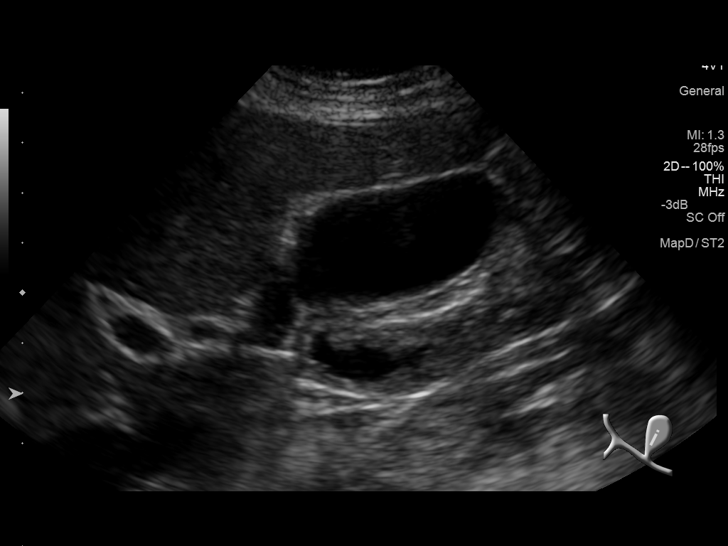
[im 10/113]
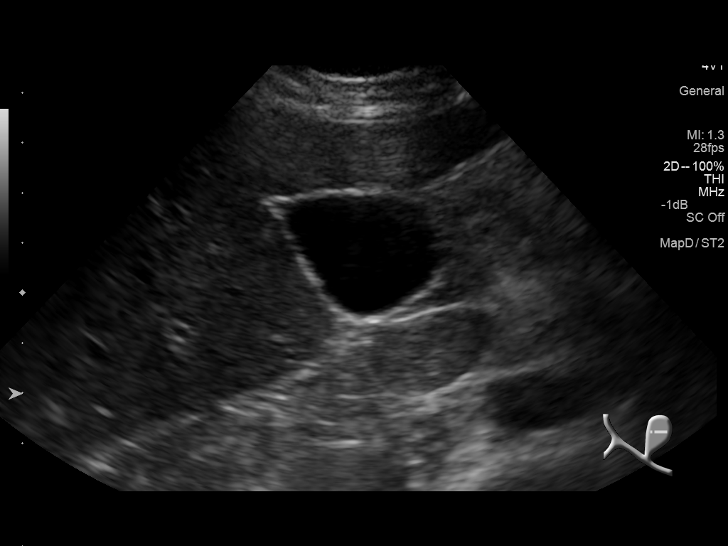
[im 19/113]
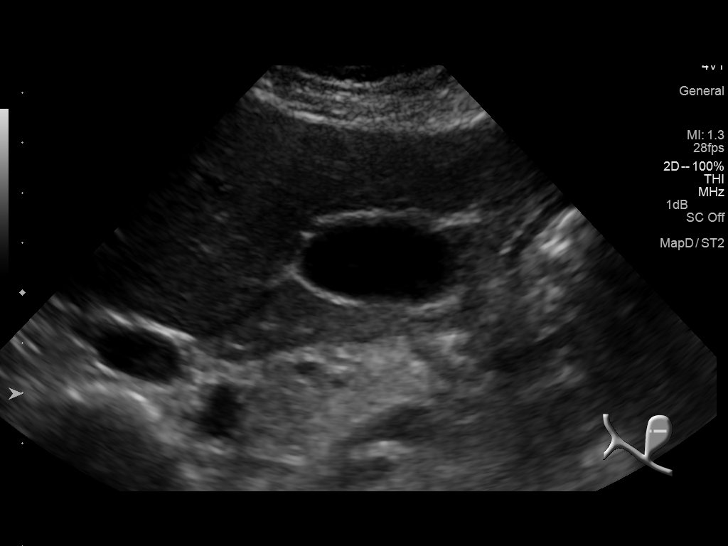
[im 29/113]
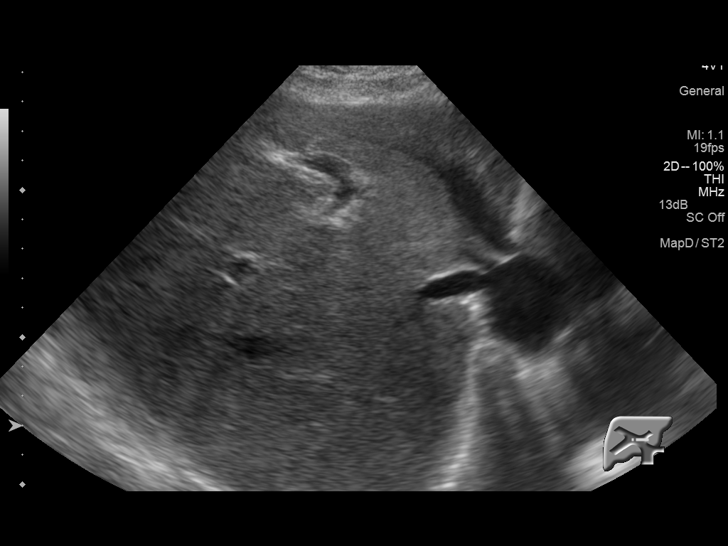
[im 38/113]
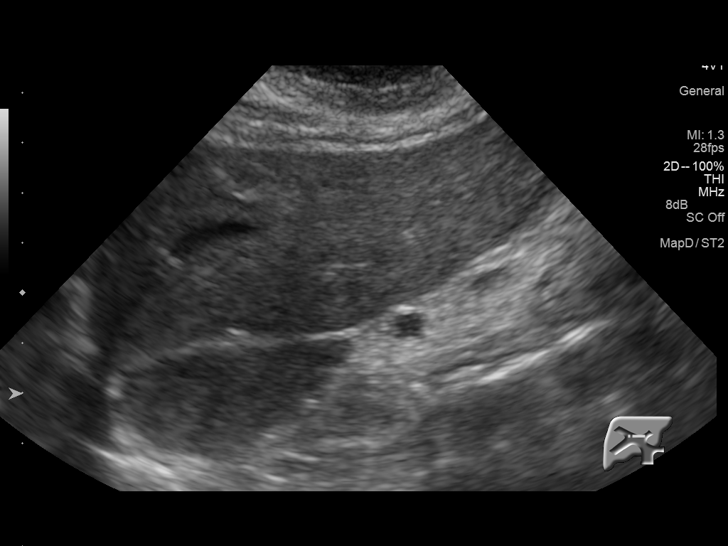
[im 47/113]
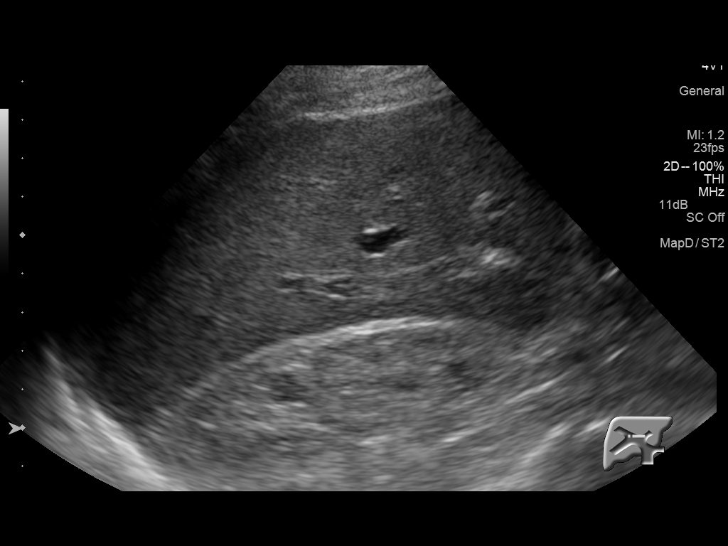
[im 57/113]
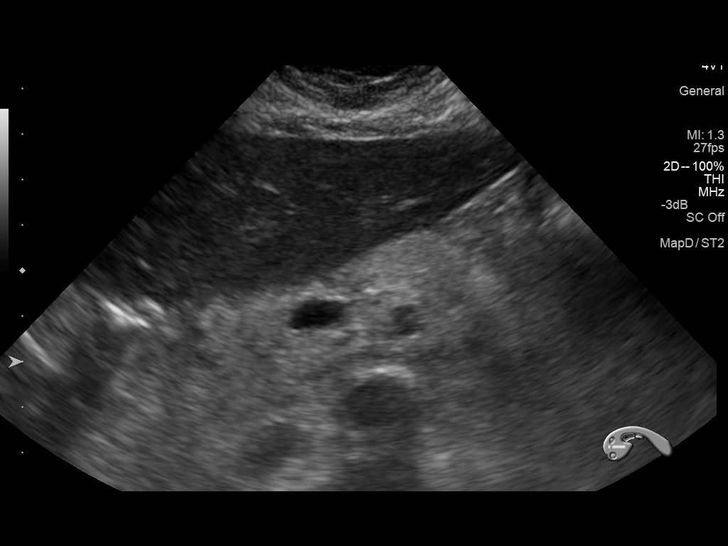
[im 66/113]
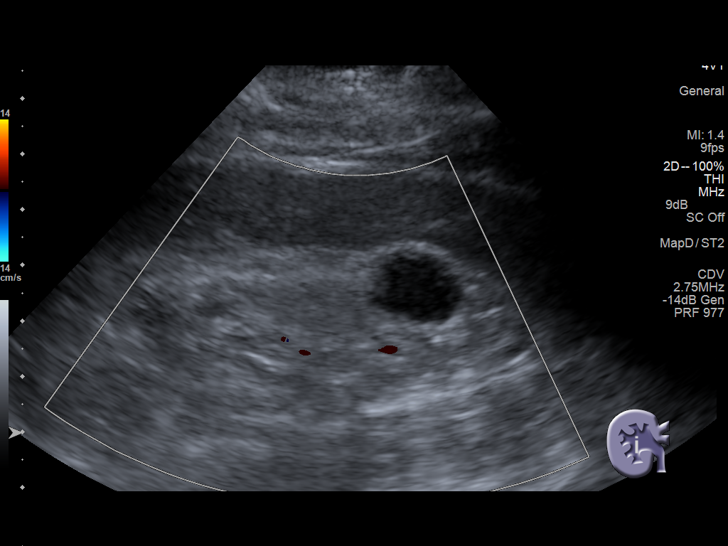
[im 75/113]
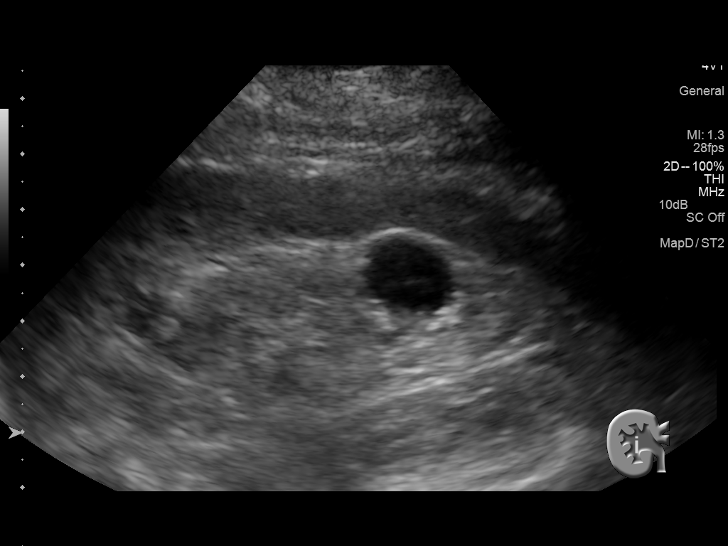
[im 85/113]
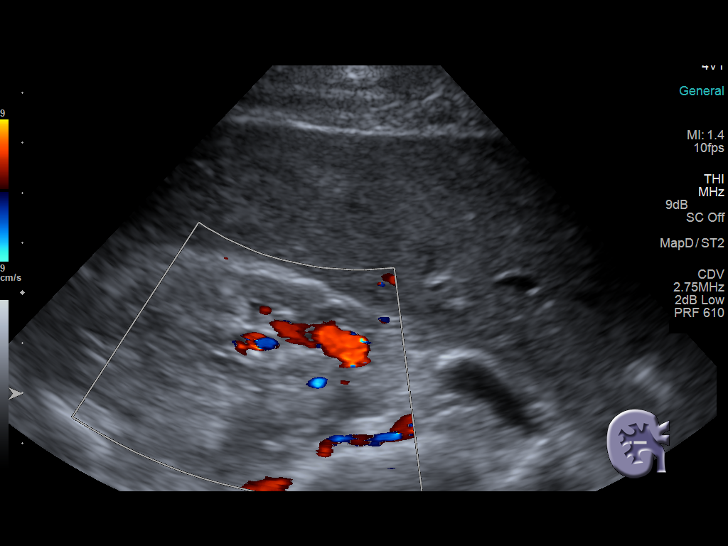
[im 94/113]
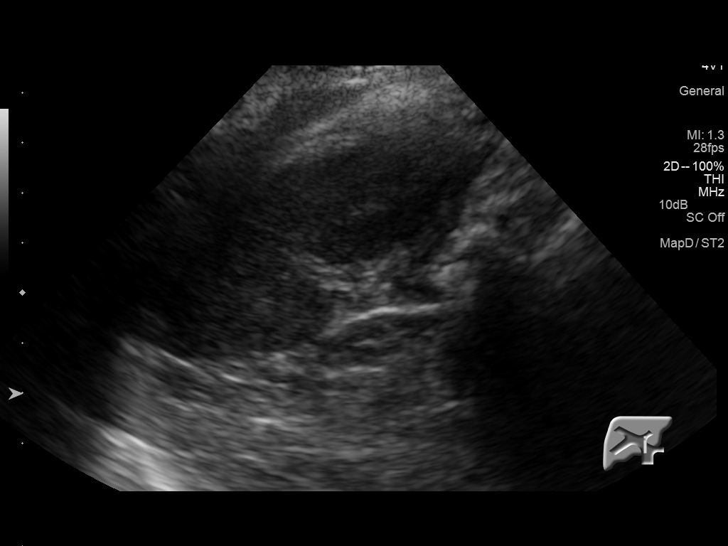
[im 103/113]
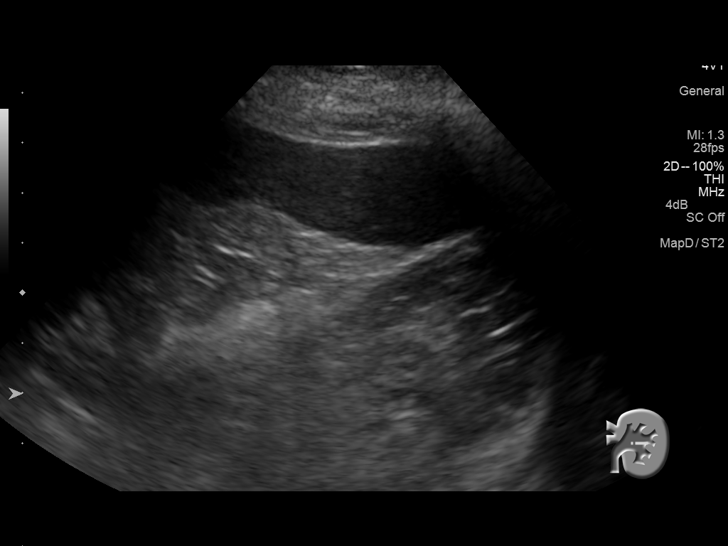
[im 113/113]
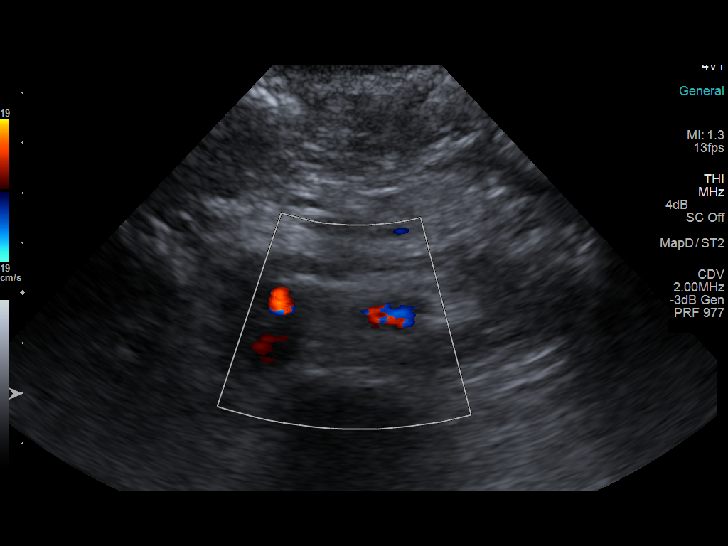

[13 of 25 positions shown; findings below may reference images not displayed]

FINDINGS: Gallbladder: The gallbladder is visualized and no gallstones are
noted. There is no pain over the gallbladder with compression.

Common bile duct: Diameter: The common bile duct is normal measuring
3.0 mm in diameter.

Liver: The liver slightly echogenic and mild fatty infiltration
cannot be excluded. No focal hepatic abnormality is seen.

IVC: No abnormality visualized.

Pancreas: Visualized portion unremarkable.

Spleen: The spleen is normal measuring 7.3 cm.

Right Kidney: Length: 7.6 cm.. The echogenicity of the renal
parenchyma is increased in the kidneys small consistent with chronic
renal medical disease. Several complex cystic structures are present
the largest of 1.8 cm.

Left Kidney: Length: 6.9 cm.. The echogenicity of the renal
parenchyma is increased, and the kidney is small consistent with
chronic renal medical disease. A small cyst is present of 1.2 cm.

Abdominal aorta: The abdominal aorta is normal in caliber with
calcified walls.

Other findings: None.
IMPRESSION: 1. Echogenic liver parenchyma may indicate fatty infiltration. No
focal hepatic abnormality is seen.
2. Echogenic renal parenchyma and the kidneys are small, consistent
with chronic renal medical disease. Small cysts are present but no
hydronephrosis is seen.
3. No gallstones.

## 2015-09-16 DIAGNOSIS — N186 End stage renal disease: Secondary | ICD-10-CM | POA: Diagnosis not present

## 2015-09-16 DIAGNOSIS — Z992 Dependence on renal dialysis: Secondary | ICD-10-CM | POA: Diagnosis not present

## 2015-09-18 DIAGNOSIS — N186 End stage renal disease: Secondary | ICD-10-CM | POA: Diagnosis not present

## 2015-09-18 DIAGNOSIS — Z992 Dependence on renal dialysis: Secondary | ICD-10-CM | POA: Diagnosis not present

## 2015-09-21 DIAGNOSIS — Z992 Dependence on renal dialysis: Secondary | ICD-10-CM | POA: Diagnosis not present

## 2015-09-21 DIAGNOSIS — N186 End stage renal disease: Secondary | ICD-10-CM | POA: Diagnosis not present

## 2015-09-23 ENCOUNTER — Other Ambulatory Visit: Payer: Self-pay | Admitting: Cardiovascular Disease

## 2015-09-23 DIAGNOSIS — N186 End stage renal disease: Secondary | ICD-10-CM | POA: Diagnosis not present

## 2015-09-23 DIAGNOSIS — Z992 Dependence on renal dialysis: Secondary | ICD-10-CM | POA: Diagnosis not present

## 2015-09-23 MED ORDER — AMIODARONE HCL 200 MG PO TABS: 200.0000 mg | ORAL_TABLET | Freq: Every day | ORAL | Status: DC

## 2015-09-23 NOTE — Telephone Encounter (Signed)
:  refill -needs amiodarone 200mg . Took her last pill today.  [facility]

## 2015-09-23 NOTE — Telephone Encounter (Signed)
Amiodarone 200 mg daily sent to pharmacy, per ED d/c summary.

## 2015-09-25 ENCOUNTER — Encounter: Payer: Medicare HMO | Admitting: Physician Assistant

## 2015-09-25 DIAGNOSIS — N186 End stage renal disease: Secondary | ICD-10-CM | POA: Diagnosis not present

## 2015-09-25 DIAGNOSIS — Z992 Dependence on renal dialysis: Secondary | ICD-10-CM | POA: Diagnosis not present

## 2015-09-28 DIAGNOSIS — N186 End stage renal disease: Secondary | ICD-10-CM | POA: Diagnosis not present

## 2015-09-28 DIAGNOSIS — Z992 Dependence on renal dialysis: Secondary | ICD-10-CM | POA: Diagnosis not present

## 2015-09-29 ENCOUNTER — Encounter: Payer: Self-pay | Admitting: Adult Health

## 2015-09-29 ENCOUNTER — Ambulatory Visit (INDEPENDENT_AMBULATORY_CARE_PROVIDER_SITE_OTHER): Payer: Medicare HMO | Admitting: Adult Health

## 2015-09-29 VITALS — BP 160/68 | HR 67 | Ht 61.0 in | Wt 138.0 lb

## 2015-09-29 DIAGNOSIS — I2510 Atherosclerotic heart disease of native coronary artery without angina pectoris: Secondary | ICD-10-CM | POA: Diagnosis not present

## 2015-09-29 DIAGNOSIS — I10 Essential (primary) hypertension: Secondary | ICD-10-CM

## 2015-09-29 MED ORDER — HYDRALAZINE HCL 50 MG PO TABS: ORAL_TABLET | ORAL | Status: DC

## 2015-09-29 NOTE — Progress Notes (Signed)
Cardiology Office Note   Date:  09/29/2015   ID:  Shawna [name], DOB May 01, 1941, MRN [id number]  PCP:  [name], MD  Cardiologist: Woodroe Chen, NP   No chief complaint on file.     History of Present Illness: Shawna [name] is a 75 y.o. female who presents for ongoing assessment and management of CAD, status post CABG, with history of malignant hypertension, hyperlipidemia, chronic diastolic heart failure, and end-stage renal disease on hemodialysis.  The patient is normally seen in the [place] office.when last seen by Dr. Bronson Ing in January of 2017, he was noted that she had a normal nuclear stress test in February 2016.  She experiences chest discomfort when her blood pressure is elevated.  Isosorbide was increased to 30 mg twice a day.  She was restarted on amlodipine 2.5 mg daily.  She today with elevated blood pressures.  Have gone over her medications extensively.  She is not taking the hydralazine as directed.  She states that she takes 2 tablets 3 times a day, her blood pressure drops too low and she feels bad.  She takes 2 100 mg tablets in the morning, and minimal it take an additional tablet if her blood pressure is higher in the evening.  She states her blood pressure has been running in the A999333 systolic.  She did not bring with her any blood pressure record as directed.  Past Medical History  Diagnosis Date  . Chronic bronchitis ([place])   . GERD (gastroesophageal reflux disease)   . PUD (peptic ulcer disease)   . History of lower GI bleeding   . Arthritis   . History of gout   . CAD (coronary artery disease)     a. 12/2011 NSTEMI/Cath/PCI LCX (2.25x14 Resolute DES) & D1 (2.25x22 Resolute DES);  b. 01/2012 Cath/PCI: LM 30, LAD 30p, 40-53m, D1 stent ok, 99 in sm branch of diag, LCX patent stent, OM1 20, RCA 95 ost (4.0x12 Promus DES), EF 55%;  c. 04/2012 Lexi Cardiolite  EF 48%, small area of scar @ base/mid inflat wall with mild peri-infarct ischemia.; CABG 12/4   . High cholesterol 12/2011  . Pneumonia ~ 2009  . Iron deficiency anemia   . TIA (transient ischemic attack)   . Anxiety   . History of blood transfusion 07/2011; 12/2011; 01/2012 X 2; 04/2012  . Carotid artery disease ([place])     a. A999333 LICA, Q000111Q   . Mitral regurgitation     a. Moderate by echo, 02/2012  . Myocardial infarction ([place])   . Chronic diastolic CHF (congestive heart failure) ([place])     a. 02/2012 Echo EF 60-65%, nl wall motion, Gr 1 DD, mod MR  . Hypertension   . AVM (arteriovenous malformation) of colon   . Esophageal stricture   . Ovarian cancer ([place]) 1992  . Colon cancer ([place]) 1992  . ESRD on hemodialysis ([place])     ESRD due to HTN, started dialysis 2011 and gets HD at [facility] with Dr Hinda Lenis on MWF schedule.  Access is LUA AVF as of Sept 2014.     Past Surgical History  Procedure Laterality Date  . Abdominal hysterectomy  1992  . Appendectomy  06/1990  . Tubal ligation  1980's  . Av fistula placement  07/2009    left upper arm  . Thrombectomy / arteriovenous graft revision  2011    left upper arm  . Colon resection  1992  . Esophagogastroduodenoscopy  01/20/2012  Procedure: ESOPHAGOGASTRODUODENOSCOPY (EGD);  Surgeon: Ladene Artist, MD,FACG;  Location: [facility] ENDOSCOPY;  Service: Endoscopy;  Laterality: N/A;  . Dilation and curettage of uterus    . Coronary angioplasty with stent placement  12/15/11    "2"  . Coronary angioplasty with stent placement  y/2013    "1; makes total of 3" (05/02/2012)  . Coronary artery bypass graft  06/13/2012    Procedure: CORONARY ARTERY BYPASS GRAFTING (CABG);  Surgeon: Grace Isaac, MD;  Location: [place];  Service: Open Heart Surgery;  Laterality: N/A;  cabg x four;  using left internal mammary artery, and left leg greater saphenous vein harvested endoscopically  . Intraoperative transesophageal echocardiogram  06/13/2012    Procedure: INTRAOPERATIVE TRANSESOPHAGEAL ECHOCARDIOGRAM;  Surgeon: Grace Isaac, MD;  Location:  [place];  Service: Open Heart Surgery;  Laterality: N/A;  . Esophagogastroduodenoscopy N/A 03/26/2013    Procedure: ESOPHAGOGASTRODUODENOSCOPY (EGD);  Surgeon: Irene Shipper, MD;  Location: [facility] ENDOSCOPY;  Service: Endoscopy;  Laterality: N/A;  . Ovary surgery      ovarian cancer  . Left heart catheterization with coronary angiogram N/A 12/15/2011    Procedure: LEFT HEART CATHETERIZATION WITH CORONARY ANGIOGRAM;  Surgeon: Burnell Blanks, MD;  Location: [facility] CATH LAB;  Service: Cardiovascular;  Laterality: N/A;  . Left heart catheterization with coronary angiogram N/A 01/10/2012    Procedure: LEFT HEART CATHETERIZATION WITH CORONARY ANGIOGRAM;  Surgeon: Peter M Martinique, MD;  Location: [facility] CATH LAB;  Service: Cardiovascular;  Laterality: N/A;  . Left heart catheterization with coronary angiogram N/A 06/08/2012    Procedure: LEFT HEART CATHETERIZATION WITH CORONARY ANGIOGRAM;  Surgeon: Burnell Blanks, MD;  Location: [facility] CATH LAB;  Service: Cardiovascular;  Laterality: N/A;  . Shuntogram N/A 10/15/2013    Procedure: Fistulogram;  Surgeon: Serafina Mitchell, MD;  Location: [name] CATH LAB;  Service: Cardiovascular;  Laterality: N/A;  . Left heart catheterization with coronary/graft angiogram N/A 12/10/2013    Procedure: LEFT HEART CATHETERIZATION WITH Beatrix Fetters;  Surgeon: [name], MD;  Location: [facility] CATH LAB;  Service: Cardiovascular;  Laterality: N/A;  . Esophagogastroduodenoscopy N/A 04/30/2015    Procedure: ESOPHAGOGASTRODUODENOSCOPY (EGD);  Surgeon: Rogene Houston, MD;  Location: [facility] ENDO SUITE;  Service: Endoscopy;  Laterality: N/A;  1pm - moved to 10/20 @ 1:10     Current Outpatient Prescriptions  Medication Sig Dispense Refill  . acetaminophen (TYLENOL) 325 MG tablet Take 2 tablets (650 mg total) by mouth every 6 (six) hours as needed for mild pain (mild pain).    Marland Kitchen ALPRAZolam (XANAX) 0.5 MG tablet Take 1 tablet (0.5 mg total) by mouth 3 (three) times daily. 30 tablet 1  .  amiodarone (PACERONE) 200 MG tablet Take 1 tablet (200 mg total) by mouth 2 (two) times daily. 26 tablet 0  . amiodarone (PACERONE) 200 MG tablet Take 1 tablet (200 mg total) by mouth daily. 30 tablet 3  . amLODipine (NORVASC) 2.5 MG tablet Take 1 tablet (2.5 mg total) by mouth daily. 30 tablet 6  . aspirin EC 81 MG tablet Take 81 mg by mouth daily.     . benzonatate (TESSALON) 100 MG capsule Take 1 capsule (100 mg total) by mouth every 8 (eight) hours. 21 capsule 0  . cloNIDine (CATAPRES) 0.3 MG tablet Take 1 tablet (0.3 mg total) by mouth 2 (two) times daily. (Patient taking differently: Take 0.3 mg by mouth daily. *May take an additional tablet based on blood pressure levels*) 60 tablet 6  . fluticasone (FLONASE)  50 MCG/ACT nasal spray Place 2 sprays into the nose daily.     . folic acid-vitamin b complex-vitamin c-selenium-zinc (DIALYVITE) 3 MG TABS Take 1 tablet by mouth daily.    . hydrALAZINE (APRESOLINE) 50 MG tablet Take 2 tablets (100 mg total) by mouth 3 (three) times daily. 540 tablet 1  . isosorbide dinitrate (ISORDIL) 30 MG tablet Take 1 tablet (30 mg total) by mouth 3 (three) times daily. 90 tablet 6  . labetalol (NORMODYNE) 200 MG tablet Take 1.5 tablets (300 mg total) by mouth 2 (two) times daily. 60 tablet 6  . lanthanum (FOSRENOL) 1000 MG chewable tablet Chew 1,000 mg by mouth 3 (three) times daily after meals.    Marland Kitchen loratadine (CLARITIN) 10 MG tablet Take 10 mg by mouth daily.     . nitroGLYCERIN (NITROSTAT) 0.4 MG SL tablet Place 1 tablet (0.4 mg total) under the tongue every 5 (five) minutes x 3 doses as needed for chest pain. 25 tablet 3  . omeprazole (PRILOSEC) 20 MG capsule Take 1 capsule (20 mg total) by mouth daily. 30 capsule 5  . ondansetron (ZOFRAN-ODT) 4 MG disintegrating tablet Take 4 mg by mouth every 8 (eight) hours as needed for nausea or vomiting. nausea    . PROAIR HFA 108 (90 BASE) MCG/ACT inhaler Inhale 1 puff into the lungs every 6 (six) hours as needed for  wheezing or shortness of breath.     . SENSIPAR 30 MG tablet Take 30 mg by mouth daily with supper.     . simvastatin (ZOCOR) 20 MG tablet TAKE ONE TABLET BY MOUTH AT BEDTIME 30 tablet 6  . tetrahydrozoline 0.05 % ophthalmic solution Place 1 drop into both eyes 2 (two) times daily as needed (irritation).     No current facility-administered medications for this visit.    Allergies:   Aspirin; Contrast media; Iron; Penicillins; Plavix; Amlodipine; Bactrim; Dexilant; Morphine and related; Nitrofurantoin; Prilosec; Venofer; Levaquin; and Protonix    Social History:  The patient  reports that she has never smoked. She has never used smokeless tobacco. She reports that she does not drink alcohol or use illicit drugs.   Family History:  The patient's family history includes Diabetes in her brother, father, mother, and sister; Heart attack in her brother, brother, and mother; Heart disease in her father and mother; Hyperlipidemia in her brother, father, and mother; Hypertension in her father, mother, sister, and sister. There is no history of Colon cancer, Esophageal cancer, Liver disease, Kidney disease, or Colon polyps.    ROS: All other systems are reviewed and negative. Unless otherwise mentioned in H&P    PHYSICAL EXAM: VS:  BP 160/68 mmHg  Pulse 67  Ht 5\' 1"  (1.549 m)  Wt 138 lb (62.596 kg)  BMI 26.09 kg/m2  SpO2 98% , BMI Body mass index is 26.09 kg/(m^2). GEN: Well nourished, well developed, in no acute distress HEENT: normal Neck: no JVD, carotid bruits, or masses Cardiac: RRR; no murmurs, rubs, or gallops,no edema  Respiratory:  Clear to auscultation bilaterally, normal work of breathing GI: soft, nontender, nondistended, + BS MS: no deformity or atrophy Skin: warm and dry, no rash Neuro:  Strength and sensation are intact Psych: euthymic mood, full affect  Recent Labs: 05/04/2015: B Natriuretic Peptide 1041.0* 08/31/2015: Magnesium 1.6* 09/08/2015: ALT 18; TSH  0.999 09/09/2015: BUN 32*; Creatinine, Ser 9.71*; Hemoglobin 12.2; Platelets 252; Potassium 4.0; Sodium 140    Lipid Panel    Component Value Date/Time   CHOL 129 07/07/2012  0815   TRIG 105 01/21/2015 2030   HDL 51 07/07/2012 0815   CHOLHDL 2.5 07/07/2012 0815   VLDL 19 07/07/2012 0815   LDLCALC 59 07/07/2012 0815      Wt Readings from Last 3 Encounters:  09/29/15 138 lb (62.596 kg)  09/09/15 134 lb 14.7 oz (61.2 kg)  09/07/15 137 lb (62.143 kg)      ASSESSMENT AND PLAN:  1. Uncontrolled hypertension: blood pressure has been elevated in the afternoons.  Patient reports that her systolic blood pressure is running in the 200s.  She is afraid to take her hydralazine as directed in the afternoon, because it drops too low.  I have instructed the patient take her 200 mg of hydralazine in the morning and then take 100 mg in the afternoon and 100 mg around 10 PM.  This will prevent her blood pressure being elevated in the morning.  She will record her blood pressure on record that I have provided for her and also bring her blood pressure machine with her next appointment.  She knows not to take the medications that are to be held on days of dialysis.  2. Chronic diastolic heart failure. There is no evidence of fluid overload despite, hypertension, and need for dialysis tomorrow.  Dialysis is managing fluid levels.   Current medicines are reviewed at length with the patient today.    Labs/ tests ordered today include:  No orders of the defined types were placed in this encounter.     Disposition:   FU with cardiology in one month with BP machine and BP records  Signed, Jory Sims, NP  09/29/2015 2:31 PM    [facility] [address], [place], [place] [address] Phone: [phone]; Fax: [phone]

## 2015-09-29 NOTE — Patient Instructions (Signed)
Your physician recommends that you schedule a follow-up appointment in: 1 month  Bring BP log and BP machine to visit   Take hydralazine as follows:   200 mg (4 pills) in the morning  100 mg (2 pills) at 2 pm  100 mg (2 pills) at 10 pm     ALL ON NON DIALYSIS DAYS     Thank you for choosing [facility] !

## 2015-09-29 NOTE — Progress Notes (Deleted)
Name: Shawna [name]    DOB: 04/21/1941  Age: 75 y.o.  MR#: [id number]       PCP:  [name], MD      Insurance: Payor: [employer] MEDICARE / Plan: [employer] MEDICARE HMO/PPO / Product Type: *No Product type* /   CC:   No chief complaint on file.   VS Filed Vitals:   09/29/15 1421  BP: 160/68  Pulse: 67  Height: 5\' 1"  (1.549 m)  Weight: 138 lb (62.596 kg)  SpO2: 98%    Weights Current Weight  09/29/15 138 lb (62.596 kg)  09/09/15 134 lb 14.7 oz (61.2 kg)  09/07/15 137 lb (62.143 kg)    Blood Pressure  BP Readings from Last 3 Encounters:  09/29/15 160/68  09/10/15 154/61  09/07/15 152/90     Admit date:  (Not on file) Last encounter with RMR:  Visit date not found   Allergy Aspirin; Contrast media; Iron; Penicillins; Plavix; Amlodipine; Bactrim; Dexilant; Morphine and related; Nitrofurantoin; Prilosec; Venofer; Levaquin; and Protonix  Current Outpatient Prescriptions  Medication Sig Dispense Refill  . acetaminophen (TYLENOL) 325 MG tablet Take 2 tablets (650 mg total) by mouth every 6 (six) hours as needed for mild pain (mild pain).    Marland Kitchen ALPRAZolam (XANAX) 0.5 MG tablet Take 1 tablet (0.5 mg total) by mouth 3 (three) times daily. 30 tablet 1  . amiodarone (PACERONE) 200 MG tablet Take 1 tablet (200 mg total) by mouth 2 (two) times daily. 26 tablet 0  . amiodarone (PACERONE) 200 MG tablet Take 1 tablet (200 mg total) by mouth daily. 30 tablet 3  . amLODipine (NORVASC) 2.5 MG tablet Take 1 tablet (2.5 mg total) by mouth daily. 30 tablet 6  . aspirin EC 81 MG tablet Take 81 mg by mouth daily.     . benzonatate (TESSALON) 100 MG capsule Take 1 capsule (100 mg total) by mouth every 8 (eight) hours. 21 capsule 0  . cloNIDine (CATAPRES) 0.3 MG tablet Take 1 tablet (0.3 mg total) by mouth 2 (two) times daily. (Patient taking differently: Take 0.3 mg by mouth daily. *May take an additional tablet based on blood pressure levels*) 60 tablet 6  . fluticasone (FLONASE) 50 MCG/ACT nasal spray  Place 2 sprays into the nose daily.     . folic acid-vitamin b complex-vitamin c-selenium-zinc (DIALYVITE) 3 MG TABS Take 1 tablet by mouth daily.    . hydrALAZINE (APRESOLINE) 50 MG tablet Take 2 tablets (100 mg total) by mouth 3 (three) times daily. 540 tablet 1  . isosorbide dinitrate (ISORDIL) 30 MG tablet Take 1 tablet (30 mg total) by mouth 3 (three) times daily. 90 tablet 6  . labetalol (NORMODYNE) 200 MG tablet Take 1.5 tablets (300 mg total) by mouth 2 (two) times daily. 60 tablet 6  . lanthanum (FOSRENOL) 1000 MG chewable tablet Chew 1,000 mg by mouth 3 (three) times daily after meals.    Marland Kitchen loratadine (CLARITIN) 10 MG tablet Take 10 mg by mouth daily.     . nitroGLYCERIN (NITROSTAT) 0.4 MG SL tablet Place 1 tablet (0.4 mg total) under the tongue every 5 (five) minutes x 3 doses as needed for chest pain. 25 tablet 3  . omeprazole (PRILOSEC) 20 MG capsule Take 1 capsule (20 mg total) by mouth daily. 30 capsule 5  . ondansetron (ZOFRAN-ODT) 4 MG disintegrating tablet Take 4 mg by mouth every 8 (eight) hours as needed for nausea or vomiting. nausea    . PROAIR HFA 108 (90 BASE) MCG/ACT  inhaler Inhale 1 puff into the lungs every 6 (six) hours as needed for wheezing or shortness of breath.     . SENSIPAR 30 MG tablet Take 30 mg by mouth daily with supper.     . simvastatin (ZOCOR) 20 MG tablet TAKE ONE TABLET BY MOUTH AT BEDTIME 30 tablet 6  . tetrahydrozoline 0.05 % ophthalmic solution Place 1 drop into both eyes 2 (two) times daily as needed (irritation).     No current facility-administered medications for this visit.    Discontinued Meds:   There are no discontinued medications.  Patient Active Problem List   Diagnosis Date Noted  . Narrow complex tachycardia ([place]) 09/08/2015  . SVT (supraventricular tachycardia) ([place]) 09/08/2015  . Influenza A 08/30/2015  . Hypertensive urgency 05/04/2015  . Acute on chronic diastolic CHF (congestive heart failure) ([place]) 05/04/2015  . Unstable  angina ([place]) 05/03/2015  . SOB (shortness of breath) on exertion   . Essential hypertension   . Encephalopathy acute   . Hyponatremia 10/01/2014  . Acute respiratory failure ([place]) 09/30/2014  . Dyspnea   . URI (upper respiratory infection)   . ESRD (end stage renal disease) ([place]) 09/29/2014  . Chest pain, cardiac   . Hyperkalemia 08/24/2014  . Pain in joint, lower leg 08/14/2014  . Chest pain 11/26/2013  . Small bowel obstruction, partial ([place]) 05/29/2013  . Chronic diastolic CHF (congestive heart failure) ([place]) 03/22/2013  . GI bleeding 03/21/2013  . Acute blood loss anemia 03/21/2013  . Vaginal odor 03/12/2013  . Vaginal discharge 03/12/2013  . Abdominal pain, other specified site 03/12/2013  . Occlusion and stenosis of carotid artery without mention of cerebral infarction 01/24/2013  . Hx of CABG 07/05/2012  . Carotid artery disease ([place]) 07/05/2012  . Anemia of chronic kidney failure 07/03/2012  . Secondary hyperparathyroidism ([place]) 07/03/2012  . Mitral regurgitation 06/12/2012  . Pneumonia 06/09/2012  . Non-STEMI (non-ST elevated myocardial infarction) ([place]) 06/08/2012  . GERD (gastroesophageal reflux disease) 01/09/2012  . HLD (hyperlipidemia) 01/05/2012  . Coronary atherosclerosis of native coronary artery 12/16/2011  . Essential hypertension, benign 12/16/2011  . ESRD on hemodialysis ([place]) 12/16/2011    LABS    Component Value Date/Time   NA 140 09/09/2015 0615   NA 137 09/08/2015 1318   NA 138 09/08/2015 1310   K 4.0 09/09/2015 0615   K 4.1 09/08/2015 1318   K 4.2 09/08/2015 1310   CL 102 09/09/2015 0615   CL 97* 09/08/2015 1318   CL 99* 09/08/2015 1310   CO2 27 09/09/2015 0615   CO2 29 09/08/2015 1310   CO2 30 09/07/2015 1700   GLUCOSE 93 09/09/2015 0615   GLUCOSE 100* 09/08/2015 1318   GLUCOSE 108* 09/08/2015 1310   BUN 32* 09/09/2015 0615   BUN 25* 09/08/2015 1318   BUN 24* 09/08/2015 1310   CREATININE 9.71* 09/09/2015 0615   CREATININE 8.30*  09/08/2015 1318   CREATININE 8.52* 09/08/2015 1310   CALCIUM 8.4* 09/09/2015 0615   CALCIUM 8.9 09/08/2015 1310   CALCIUM 8.3* 09/07/2015 1700   CALCIUM 8.1* 05/29/2013 1814   GFRNONAA 3* 09/09/2015 0615   GFRNONAA 4* 09/08/2015 1310   GFRNONAA 7* 09/07/2015 1700   GFRAA 4* 09/09/2015 0615   GFRAA 5* 09/08/2015 1310   GFRAA 8* 09/07/2015 1700   CMP     Component Value Date/Time   NA 140 09/09/2015 0615   K 4.0 09/09/2015 0615   CL 102 09/09/2015 0615   CO2 27 09/09/2015 0615   GLUCOSE  93 09/09/2015 0615   BUN 32* 09/09/2015 0615   CREATININE 9.71* 09/09/2015 0615   CALCIUM 8.4* 09/09/2015 0615   CALCIUM 8.1* 05/29/2013 1814   PROT 6.3* 09/08/2015 1310   ALBUMIN 3.3* 09/08/2015 1310   AST 26 09/08/2015 1310   ALT 18 09/08/2015 1310   ALKPHOS 113 09/08/2015 1310   BILITOT 0.3 09/08/2015 1310   GFRNONAA 3* 09/09/2015 0615   GFRAA 4* 09/09/2015 0615       Component Value Date/Time   WBC 5.2 09/09/2015 0615   WBC 6.4 09/08/2015 1310   WBC 6.4 09/07/2015 1700   HGB 12.2 09/09/2015 1821   HGB 6.6* 09/09/2015 0917   HGB 6.2* 09/09/2015 0615   HCT 36.5 09/09/2015 1821   HCT 21.0* 09/09/2015 0917   HCT 20.4* 09/09/2015 0615   MCV 103.0* 09/09/2015 0615   MCV 102.5* 09/08/2015 1310   MCV 101.7* 09/07/2015 1700    Lipid Panel     Component Value Date/Time   CHOL 129 07/07/2012 0815   TRIG 105 01/21/2015 2030   HDL 51 07/07/2012 0815   CHOLHDL 2.5 07/07/2012 0815   VLDL 19 07/07/2012 0815   LDLCALC 59 07/07/2012 0815    ABG    Component Value Date/Time   PHART 7.499* 08/31/2015 1830   PCO2ART 33.3* 08/31/2015 1830   PO2ART 107* 08/31/2015 1830   HCO3 26.9* 08/31/2015 1830   TCO2 29 09/08/2015 1318   ACIDBASEDEF 2.0 01/21/2015 2256   O2SAT 97.0 08/31/2015 1830     Lab Results  Component Value Date   TSH 0.999 09/08/2015   BNP (last 3 results)  Recent Labs  05/04/15 0518  BNP 1041.0*    ProBNP (last 3 results) No results for input(s): PROBNP in  the last 8760 hours.  Cardiac Panel (last 3 results) No results for input(s): CKTOTAL, CKMB, TROPONINI, RELINDX in the last 72 hours.  Iron/TIBC/Ferritin/ %Sat    Component Value Date/Time   IRON 52 09/08/2015 1310   TIBC 211* 09/08/2015 1310   FERRITIN 779* 09/08/2015 1310   IRONPCTSAT 25 09/08/2015 1310     EKG Orders placed or performed during the hospital encounter of 09/08/15  . EKG 12-Lead  . EKG 12-Lead  . EKG 12-Lead  . EKG 12-Lead  . EKG     Prior Assessment and Plan Problem List as of 09/29/2015      Cardiovascular and Mediastinum   Coronary atherosclerosis of native coronary artery   Last Assessment & Plan 05/06/2015 Office Visit Written 05/06/2015  2:39 PM by Imogene Burn, PA-C     Stable without angina      Essential hypertension, benign   Last Assessment & Plan 01/16/2014 Office Visit Edited 01/16/2014  1:46 PM by Lendon Colonel, NP    BP is the best I have seen it in months. She is off prednisone. She has a home BP machine that we have provide for her. She is easily anxious. I will continue her on anti-hypertensive meds she is currently taking. She is advised to only take BP twice a day and not fixate on her BP recordings as BP changes throughout the day.I have refilled isosorbide,      Non-STEMI (non-ST elevated myocardial infarction) [facility])   Mitral regurgitation   Carotid artery disease (HCC)   Occlusion and stenosis of carotid artery without mention of cerebral infarction   Chronic diastolic CHF (congestive heart failure) [facility])   Last Assessment & Plan 05/06/2015 Office Visit Written 05/06/2015  2:38 PM by  Imogene Burn, PA-C     Heart failure compensated.      Essential hypertension   Last Assessment & Plan 05/06/2015 Office Visit Written 05/06/2015  2:38 PM by Imogene Burn, PA-C     Patient had another admission with hypertensive emergency. She was discharged yesterday. Unfortunately she has not filled her medications on the increased that was  done in the hospital. She had dialysis today so her blood pressure is fine at the current time. I had along discussion with her about the importance of kidneys meds filled soon as she leaves our office. She says she'll go straight to [employer]. This is an increase in the labetalol to 300 mg twice a day and hydralazine 75 mg 3 times a day. Follow-up with Dr.Koneswaran in 1 month      Unstable angina (HCC)   Hypertensive urgency   Acute on chronic diastolic CHF (congestive heart failure) (HCC)   SVT (supraventricular tachycardia) (HCC)     Respiratory   Pneumonia   Acute respiratory failure (HCC)   URI (upper respiratory infection)   Influenza A     Digestive   GERD (gastroesophageal reflux disease)   GI bleeding   Small bowel obstruction, partial ([place])     Endocrine   Secondary hyperparathyroidism ([place])     Nervous and Auditory   Encephalopathy acute     Genitourinary   Anemia of chronic kidney failure   ESRD on hemodialysis [facility])   Last Assessment & Plan 12/17/2013 Office Visit Written 12/17/2013  4:20 PM by Lendon Colonel, NP    The patient has dialysis on Monday Wednesdays Fridays. She states that her blood pressures been rising during dialysis consider decreasing as it normally does. She also states that when her blood pressure drops to below [id number] systolic she becomes very lightheaded and dizzy and is unable tolerate a lower blood pressure. Close followup of her blood pressure after increase in amlodipine is planned      ESRD (end stage renal disease) ([place])     Other   HLD (hyperlipidemia)   Last Assessment & Plan 09/20/2012 Office Visit Written 09/20/2012  1:38 PM by Donney Dice, PA-C    Will reassess lipid status with a FLP. Recommend target LDL 70 or less, if feasible. Continue current low-dose Lipitor, pending review of results.      Hx of CABG   Last Assessment & Plan 12/17/2013 Office Visit Written 12/17/2013  4:19 PM by Lendon Colonel, NP    Cardiac catheterization  was completed by Dr. Ellwood Sayers on 12/10/2013. This revealed patent left main coronary artery focal significant disease in the mid to distal left anterior descending artery patent diagonal branch LIMA to LAD patent severe restenosis of the proximal left circumflex artery stent, patent SVG to circumflex, severe restenosis of the proximal left circumflex artery stent. Patent SVG to circumflex, 70% proximal ramus stenosis. Widely patent SVG to ramus. Occluded stent at the ostium of the right coronary artery. Widely patent SVG to distal RCA. Left ventricular systolic function was not assessed.  She will be continued on her current cardiac medications to include stat therapy along with labetalol, with increasing doses of antihypertensives for better blood pressure control.      Vaginal odor   Vaginal discharge   Last Assessment & Plan 01/16/2014 Office Visit Written 01/16/2014  1:48 PM by Lendon Colonel, NP    I have given her Rx for two days of Diflucan 150 mg. IF continues to be  an issue, should see PCP.      Abdominal pain, other specified site   Acute blood loss anemia   Chest pain   Pain in joint, lower leg   Hyperkalemia   Chest pain, cardiac   Dyspnea   Hyponatremia   SOB (shortness of breath) on exertion   Narrow complex tachycardia (HCC)       Imaging: Ct Head Wo Contrast  08/31/2015  CLINICAL DATA:  Fatigue and weakness for 2 days. EXAM: CT HEAD WITHOUT CONTRAST TECHNIQUE: Contiguous axial images were obtained from the base of the skull through the vertex without intravenous contrast. COMPARISON:  MRI from 03/03/2015.  Head CT from 12/16/2012. FINDINGS: There is no evidence for acute hemorrhage, hydrocephalus, mass lesion, or abnormal extra-axial fluid collection. No definite CT evidence for acute infarction. Patchy low attenuation in the deep hemispheric and periventricular white matter is nonspecific, but likely reflects chronic microvascular ischemic demyelination. Lacunar infarct is  seen in the left basal ganglia. Air-fluid level is identified in the left sphenoid sinus. The remaining visualized paranasal sinuses are clear. No evidence for fluid in the mastoid air cells. IMPRESSION: 1. No acute intracranial abnormality. 2. Chronic small vessel white matter ischemic disease. 3. Air-fluid level in the left sphenoid sinus. Imaging features are compatible with acute sinusitis. Electronically Signed   By: Misty Stanley M.D.   On: 08/31/2015 20:01   Dg Chest Port 1 View  09/08/2015  CLINICAL DATA:  Tachycardia, chest tightness and shortness of breath since this morning EXAM: PORTABLE CHEST 1 VIEW COMPARISON:  08/31/2015 FINDINGS: Previous coronary bypass noted. Native coronary stents present. Mild cardiomegaly without acute CHF or pneumonia. No focal collapse or consolidation. Negative for effusion or pneumothorax. Monitor leads overlie the chest. IMPRESSION: Mild cardiomegaly without acute process. Stable postoperative findings. Electronically Signed   By: Jerilynn Mages.  Shick M.D.   On: 09/08/2015 13:50   Dg Chest Port 1 View  08/31/2015  CLINICAL DATA:  Acute encephalopathy. EXAM: PORTABLE CHEST 1 VIEW COMPARISON:  08/30/2015 FINDINGS: 2016 hours. Interval increase in interstitial opacity. No focal airspace consolidation. No overt airspace pulmonary edema or pleural effusion. The cardio pericardial silhouette is enlarged. Patient is status post CABG. The visualized bony structures of the thorax are intact. IMPRESSION: Cardiomegaly with interval increase in interstitial opacity, likely edema given the rapid onset. Electronically Signed   By: Misty Stanley M.D.   On: 08/31/2015 20:39

## 2015-09-30 DIAGNOSIS — Z992 Dependence on renal dialysis: Secondary | ICD-10-CM | POA: Diagnosis not present

## 2015-09-30 DIAGNOSIS — N186 End stage renal disease: Secondary | ICD-10-CM | POA: Diagnosis not present

## 2015-10-02 DIAGNOSIS — Z992 Dependence on renal dialysis: Secondary | ICD-10-CM | POA: Diagnosis not present

## 2015-10-02 DIAGNOSIS — N186 End stage renal disease: Secondary | ICD-10-CM | POA: Diagnosis not present

## 2015-10-05 DIAGNOSIS — N186 End stage renal disease: Secondary | ICD-10-CM | POA: Diagnosis not present

## 2015-10-05 DIAGNOSIS — Z992 Dependence on renal dialysis: Secondary | ICD-10-CM | POA: Diagnosis not present

## 2015-10-07 DIAGNOSIS — Z992 Dependence on renal dialysis: Secondary | ICD-10-CM | POA: Diagnosis not present

## 2015-10-07 DIAGNOSIS — N186 End stage renal disease: Secondary | ICD-10-CM | POA: Diagnosis not present

## 2015-10-09 ENCOUNTER — Telehealth: Payer: Self-pay | Admitting: Cardiovascular Disease

## 2015-10-09 DIAGNOSIS — N186 End stage renal disease: Secondary | ICD-10-CM | POA: Diagnosis not present

## 2015-10-09 DIAGNOSIS — Z992 Dependence on renal dialysis: Secondary | ICD-10-CM | POA: Diagnosis not present

## 2015-10-09 NOTE — Telephone Encounter (Signed)
Pls call the pt concerning her simvastatin (ZOCOR) 20 MG tablet

## 2015-10-09 NOTE — Telephone Encounter (Signed)
[id number] 

## 2015-10-10 DIAGNOSIS — I4891 Unspecified atrial fibrillation: Secondary | ICD-10-CM | POA: Diagnosis not present

## 2015-10-12 DIAGNOSIS — Z992 Dependence on renal dialysis: Secondary | ICD-10-CM | POA: Diagnosis not present

## 2015-10-12 DIAGNOSIS — N186 End stage renal disease: Secondary | ICD-10-CM | POA: Diagnosis not present

## 2015-10-14 DIAGNOSIS — N186 End stage renal disease: Secondary | ICD-10-CM | POA: Diagnosis not present

## 2015-10-14 DIAGNOSIS — C561 Malignant neoplasm of right ovary: Secondary | ICD-10-CM | POA: Insufficient documentation

## 2015-10-14 DIAGNOSIS — I480 Paroxysmal atrial fibrillation: Secondary | ICD-10-CM | POA: Insufficient documentation

## 2015-10-14 DIAGNOSIS — Z992 Dependence on renal dialysis: Secondary | ICD-10-CM | POA: Diagnosis not present

## 2015-10-14 DIAGNOSIS — I4891 Unspecified atrial fibrillation: Secondary | ICD-10-CM | POA: Insufficient documentation

## 2015-10-14 DIAGNOSIS — Z9861 Coronary angioplasty status: Secondary | ICD-10-CM | POA: Insufficient documentation

## 2015-10-14 NOTE — Telephone Encounter (Signed)
Called pt- no answer, left voicemail for her to return call.

## 2015-10-16 DIAGNOSIS — N186 End stage renal disease: Secondary | ICD-10-CM | POA: Diagnosis not present

## 2015-10-16 DIAGNOSIS — Z992 Dependence on renal dialysis: Secondary | ICD-10-CM | POA: Diagnosis not present

## 2015-10-16 NOTE — Telephone Encounter (Signed)
Called pt left another voicemail for her to return call concerning her medication (simvastatin)

## 2015-10-19 DIAGNOSIS — Z992 Dependence on renal dialysis: Secondary | ICD-10-CM | POA: Diagnosis not present

## 2015-10-19 DIAGNOSIS — N186 End stage renal disease: Secondary | ICD-10-CM | POA: Diagnosis not present

## 2015-10-21 DIAGNOSIS — Z992 Dependence on renal dialysis: Secondary | ICD-10-CM | POA: Diagnosis not present

## 2015-10-21 DIAGNOSIS — N186 End stage renal disease: Secondary | ICD-10-CM | POA: Diagnosis not present

## 2015-10-23 DIAGNOSIS — Z992 Dependence on renal dialysis: Secondary | ICD-10-CM | POA: Diagnosis not present

## 2015-10-23 DIAGNOSIS — N186 End stage renal disease: Secondary | ICD-10-CM | POA: Diagnosis not present

## 2015-10-26 ENCOUNTER — Telehealth (INDEPENDENT_AMBULATORY_CARE_PROVIDER_SITE_OTHER): Payer: Self-pay | Admitting: Internal Medicine

## 2015-10-26 DIAGNOSIS — Z992 Dependence on renal dialysis: Secondary | ICD-10-CM | POA: Diagnosis not present

## 2015-10-26 DIAGNOSIS — N186 End stage renal disease: Secondary | ICD-10-CM | POA: Diagnosis not present

## 2015-10-26 NOTE — Telephone Encounter (Signed)
Patient called, stated she thinks she still has blood in her bowel, still dark.  She would like to know if you can order an ultrasound, wondering if this would tell you anything.  [place]

## 2015-10-26 NOTE — Telephone Encounter (Signed)
Message left on phone If her stools are black, she should go to the ED

## 2015-10-27 ENCOUNTER — Ambulatory Visit (INDEPENDENT_AMBULATORY_CARE_PROVIDER_SITE_OTHER): Payer: Medicare HMO | Admitting: Cardiovascular Disease

## 2015-10-27 ENCOUNTER — Encounter: Payer: Self-pay | Admitting: Cardiovascular Disease

## 2015-10-27 VITALS — BP 184/74 | HR 67 | Ht 61.0 in | Wt 139.0 lb

## 2015-10-27 DIAGNOSIS — Z9289 Personal history of other medical treatment: Secondary | ICD-10-CM

## 2015-10-27 DIAGNOSIS — E7800 Pure hypercholesterolemia, unspecified: Secondary | ICD-10-CM

## 2015-10-27 DIAGNOSIS — I739 Peripheral vascular disease, unspecified: Secondary | ICD-10-CM

## 2015-10-27 DIAGNOSIS — I10 Essential (primary) hypertension: Secondary | ICD-10-CM | POA: Diagnosis not present

## 2015-10-27 DIAGNOSIS — Z87898 Personal history of other specified conditions: Secondary | ICD-10-CM

## 2015-10-27 DIAGNOSIS — Z951 Presence of aortocoronary bypass graft: Secondary | ICD-10-CM

## 2015-10-27 DIAGNOSIS — N186 End stage renal disease: Secondary | ICD-10-CM | POA: Diagnosis not present

## 2015-10-27 DIAGNOSIS — I5032 Chronic diastolic (congestive) heart failure: Secondary | ICD-10-CM

## 2015-10-27 DIAGNOSIS — I340 Nonrheumatic mitral (valve) insufficiency: Secondary | ICD-10-CM

## 2015-10-27 DIAGNOSIS — I6523 Occlusion and stenosis of bilateral carotid arteries: Secondary | ICD-10-CM

## 2015-10-27 DIAGNOSIS — I25768 Atherosclerosis of bypass graft of coronary artery of transplanted heart with other forms of angina pectoris: Secondary | ICD-10-CM

## 2015-10-27 NOTE — Patient Instructions (Signed)
Continue all current medications. Nurse visit for monitor comparison.  Your physician wants you to follow up in:  4 months.  You will receive a reminder letter in the mail one-two months in advance.  If you don't receive a letter, please call our office to schedule the follow up appointment

## 2015-10-27 NOTE — Progress Notes (Signed)
Patient ID: Shawna [name], female   DOB: 08-25-1940, 75 y.o.   MRN: [id number]      SUBJECTIVE: Shawna [name] returns for a follow up visit. In summary, she has a history of coronary artery disease status post CABG, malignant hypertension, hyperlipidemia, chronic diastolic heart failure, and end-stage renal disease on hemodialysis.   She had a normal nuclear cardiac stress test on 08/25/14, LVEF 57%.  Coronary angiography on 12/10/13 demonstrated a patent left main coronary artery, focal significant disease in the mid to distal left anterior descending artery, patent diagonal branch, LIMA to LAD was widely patent, severe restenosis of the proximal left circumflex artery stent, patent SVG to circumflex, 70% proximal ramus stenosis, widely patent SVG to ramus, occluded stent at the ostium of the right coronary artery, and widely patent SVG to distal RCA.  Echocardiogram 09/09/15 demonstrated normal left ventricular systolic function, LVEF 123456, mild LVH, grade 2 diastolic dysfunction with increased left ventricular filling pressures, moderate mitral and mild tricuspid regurgitation.  She was hospitalized for SVT and discharged in early March. She is on both amiodarone and labetalol for this. She also had anemia and required 2 unit packed red blood cell transfusion. She had the flu earlier this year.  She had an episode of SVT in February resolving after IV adenosine.  She has not been taking her morning labetalol and BP is elevated. Denies chest pain. Has an upset stomach.   Review of Systems: As per "subjective", otherwise negative.  Allergies  Allergen Reactions  . Aspirin Other (See Comments)    Mess up her stomach; "makes my bowels have blood in them". Takes 81 mg EC Aspirin   . Contrast Media [Iodinated Diagnostic Agents] Itching  . Iron Itching and Other (See Comments)    "they gave me iron in dialysis; had to give me Benadryl cause I had to have the iron" (05/02/2012)  . Penicillins Other  (See Comments)    Current Outpatient Prescriptions  Medication Sig Dispense Refill  . acetaminophen (TYLENOL) 325 MG tablet Take 2 tablets (650 mg total) by mouth every 6 (six) hours as needed for mild pain (mild pain).    Marland Kitchen ALPRAZolam (XANAX) 0.5 MG tablet Take 1 tablet (0.5 mg total) by mouth 3 (three) times daily. 30 tablet 1  . amiodarone (PACERONE) 200 MG tablet Take 1 tablet (200 mg total) by mouth daily. 30 tablet 3  . amLODipine (NORVASC) 2.5 MG tablet Take 1 tablet (2.5 mg total) by mouth daily. 30 tablet 6  . aspirin EC 81 MG tablet Take 81 mg by mouth daily.     . benzonatate (TESSALON) 100 MG capsule Take 1 capsule (100 mg total) by mouth every 8 (eight) hours. 21 capsule 0  . cloNIDine (CATAPRES) 0.3 MG tablet Take 1 tablet (0.3 mg total) by mouth 2 (two) times daily. (Patient taking differently: Take 0.3 mg by mouth daily. *May take an additional tablet based on blood pressure levels*) 60 tablet 6  . fluticasone (FLONASE) 50 MCG/ACT nasal spray Place 2 sprays into the nose daily.     . folic acid-vitamin b complex-vitamin c-selenium-zinc (DIALYVITE) 3 MG TABS Take 1 tablet by mouth daily.    . hydrALAZINE (APRESOLINE) 50 MG tablet Take 200 mg (4 pills) in the am, 100 mg ( 2 pills) at 2 pm, and 100 mg ( 2 pills) at 10 pm (Patient taking differently: Take 200 mg (4 pills) in the am, 100 mg ( 2 pills) at 2 pm, and 100 mg (  2 pills) at 10 pm On NON-DIALYSIS DAYS) 240 tablet 3  . isosorbide dinitrate (ISORDIL) 30 MG tablet Take 1 tablet (30 mg total) by mouth 3 (three) times daily. 90 tablet 6  . labetalol (NORMODYNE) 200 MG tablet Take 1.5 tablets (300 mg total) by mouth 2 (two) times daily. 60 tablet 6  . lanthanum (FOSRENOL) 1000 MG chewable tablet Chew 1,000 mg by mouth 3 (three) times daily after meals.    Marland Kitchen loratadine (CLARITIN) 10 MG tablet Take 10 mg by mouth daily.     . nitroGLYCERIN (NITROSTAT) 0.4 MG SL tablet Place 1 tablet (0.4 mg total) under the tongue every 5 (five)  minutes x 3 doses as needed for chest pain. 25 tablet 3  . omeprazole (PRILOSEC) 20 MG capsule Take 1 capsule (20 mg total) by mouth daily. 30 capsule 5  . ondansetron (ZOFRAN-ODT) 4 MG disintegrating tablet Take 4 mg by mouth every 8 (eight) hours as needed for nausea or vomiting. nausea    . PROAIR HFA 108 (90 BASE) MCG/ACT inhaler Inhale 1 puff into the lungs every 6 (six) hours as needed for wheezing or shortness of breath.     . SENSIPAR 30 MG tablet Take 30 mg by mouth daily with supper.     . simvastatin (ZOCOR) 20 MG tablet TAKE ONE TABLET BY MOUTH AT BEDTIME 30 tablet 6  . tetrahydrozoline 0.05 % ophthalmic solution Place 1 drop into both eyes 2 (two) times daily as needed (irritation).     No current facility-administered medications for this visit.    Past Medical History  Diagnosis Date  . Chronic bronchitis ([place])   . GERD (gastroesophageal reflux disease)   . PUD (peptic ulcer disease)   . History of lower GI bleeding   . Arthritis   . History of gout   . CAD (coronary artery disease)     a. 12/2011 NSTEMI/Cath/PCI LCX (2.25x14 Resolute DES) & D1 (2.25x22 Resolute DES);  b. 01/2012 Cath/PCI: LM 30, LAD 30p, 40-30m, D1 stent ok, 99 in sm branch of diag, LCX patent stent, OM1 20, RCA 95 ost (4.0x12 Promus DES), EF 55%;  c. 04/2012 Lexi Cardiolite  EF 48%, small area of scar @ base/mid inflat wall with mild peri-infarct ischemia.; CABG 12/4  . High cholesterol 12/2011  . Pneumonia ~ 2009  . Iron deficiency anemia   . TIA (transient ischemic attack)   . Anxiety   . History of blood transfusion 07/2011; 12/2011; 01/2012 X 2; 04/2012  . Carotid artery disease ([place])     a. A999333 LICA, Q000111Q   . Mitral regurgitation     a. Moderate by echo, 02/2012  . Myocardial infarction ([place])   . Chronic diastolic CHF (congestive heart failure) ([place])     a. 02/2012 Echo EF 60-65%, nl wall motion, Gr 1 DD, mod MR  . Hypertension   . AVM (arteriovenous malformation) of colon   . Esophageal  stricture   . Ovarian cancer ([place]) 1992  . Colon cancer ([place]) 1992  . ESRD on hemodialysis ([place])     ESRD due to HTN, started dialysis 2011 and gets HD at [facility] with Dr Hinda Lenis on MWF schedule.  Access is LUA AVF as of Sept 2014.     Past Surgical History  Procedure Laterality Date  . Abdominal hysterectomy  1992  . Appendectomy  06/1990  . Tubal ligation  1980's  . Av fistula placement  07/2009    left upper arm  . Thrombectomy /  arteriovenous graft revision  2011    left upper arm  . Colon resection  1992  . Esophagogastroduodenoscopy  01/20/2012    Procedure: ESOPHAGOGASTRODUODENOSCOPY (EGD);  Surgeon: Ladene Artist, MD,FACG;  Location: [facility] ENDOSCOPY;  Service: Endoscopy;  Laterality: N/A;  . Dilation and curettage of uterus    . Coronary angioplasty with stent placement  12/15/11    "2"  . Coronary angioplasty with stent placement  y/2013    "1; makes total of 3" (05/02/2012)  . Coronary artery bypass graft  06/13/2012    Procedure: CORONARY ARTERY BYPASS GRAFTING (CABG);  Surgeon: Grace Isaac, MD;  Location: [place];  Service: Open Heart Surgery;  Laterality: N/A;  cabg x four;  using left internal mammary artery, and left leg greater saphenous vein harvested endoscopically  . Intraoperative transesophageal echocardiogram  06/13/2012    Procedure: INTRAOPERATIVE TRANSESOPHAGEAL ECHOCARDIOGRAM;  Surgeon: Grace Isaac, MD;  Location: [place];  Service: Open Heart Surgery;  Laterality: N/A;  . Esophagogastroduodenoscopy N/A 03/26/2013    Procedure: ESOPHAGOGASTRODUODENOSCOPY (EGD);  Surgeon: Irene Shipper, MD;  Location: [facility] ENDOSCOPY;  Service: Endoscopy;  Laterality: N/A;  . Ovary surgery      ovarian cancer  . Left heart catheterization with coronary angiogram N/A 12/15/2011    Procedure: LEFT HEART CATHETERIZATION WITH CORONARY ANGIOGRAM;  Surgeon: Burnell Blanks, MD;  Location: [facility] CATH LAB;  Service: Cardiovascular;  Laterality: N/A;  . Left heart catheterization  with coronary angiogram N/A 01/10/2012    Procedure: LEFT HEART CATHETERIZATION WITH CORONARY ANGIOGRAM;  Surgeon: Peter M Martinique, MD;  Location: [facility] CATH LAB;  Service: Cardiovascular;  Laterality: N/A;  . Left heart catheterization with coronary angiogram N/A 06/08/2012    Procedure: LEFT HEART CATHETERIZATION WITH CORONARY ANGIOGRAM;  Surgeon: Burnell Blanks, MD;  Location: [facility] CATH LAB;  Service: Cardiovascular;  Laterality: N/A;  . Shuntogram N/A 10/15/2013    Procedure: Fistulogram;  Surgeon: Serafina Mitchell, MD;  Location: [facility] CATH LAB;  Service: Cardiovascular;  Laterality: N/A;  . Left heart catheterization with coronary/graft angiogram N/A 12/10/2013    Procedure: LEFT HEART CATHETERIZATION WITH Beatrix Fetters;  Surgeon: [name], MD;  Location: [facility] CATH LAB;  Service: Cardiovascular;  Laterality: N/A;  . Esophagogastroduodenoscopy N/A 04/30/2015    Procedure: ESOPHAGOGASTRODUODENOSCOPY (EGD);  Surgeon: Rogene Houston, MD;  Location: [facility] ENDO SU[facility];  Service: Endoscopy;  Laterality: N/A;  1pm - moved to 10/20 @ 1:10    Social History   Social History  . Marital Status: Married    Spouse Name: N/A  . Number of Children: N/A  . Years of Education: N/A   Occupational History  . Not on file.   Social History Main Topics  . Smoking status: Never Smoker   . Smokeless tobacco: Never Used  . Alcohol Use: No  . Drug Use: No  . Sexual Activity: Yes    Birth Control/ Protection: Surgical   Other Topics Concern  . Not on file   Social History Narrative   Lives in [place], [place] with husband.  Dialysis pt - [facility].     Filed Vitals:   10/27/15 1102  BP: 184/74  Pulse: 67  Height: 5\' 1"  (1.549 m)  Weight: 139 lb (63.05 kg)    PHYSICAL EXAM General: NAD HEENT: Normal. Neck: No JVD, no thyromegaly. Lungs: Clear to auscultation bilaterally with normal respiratory effort. CV: Nondisplaced PMI.  Regular rate and rhythm, normal S1/S2, no S3/S4, no murmur. No  pretibial or periankle edema.  Abdomen: Soft, nontender, no distention.  Neurologic: Alert and oriented.  Psych: Normal affect. Skin: Normal. Musculoskeletal: No gross deformities.  ECG: Most recent ECG reviewed.      ASSESSMENT AND PLAN: 1. CAD s/p CABG: Symptomatically stable today. She has chest pain on occasion when BP is high. Normal nuclear stress test (08/25/14). I will continue ASA, labetalol, isosorbide dinitrate 30 mg tid, amlodipine, and statin.   2. Malignant HTN: Elevated onhydralazine TID, clonidine 0.3 mg bid, but did not take morning labetalol. I instructed her to do so if SBP is 160 or higher.  I previously instructed her not to take the afternoon doses on dialysis days when her BP occasionally drops.  On 2.5 mg amlodipine because 5 mg caused feet swelling. She will bring in her BP cuff to make sure it is calibrated appropriately.  3. Hyperlipidemia: Continue statin therapy.  4. Mitral regurgitation: Moderate in 09/2015. Will monitor.  5. Carotid artery stenosis: Less than 40% bilaterally on 08/14/14. Continue surveillance monitoring.   6. PSVT: Stable on amiodarone. No changes.  Dispo: f/u 4 months.   Kate Sable, M.D., F.A.C.C.

## 2015-10-28 DIAGNOSIS — N186 End stage renal disease: Secondary | ICD-10-CM | POA: Diagnosis not present

## 2015-10-28 DIAGNOSIS — K625 Hemorrhage of anus and rectum: Secondary | ICD-10-CM | POA: Diagnosis not present

## 2015-10-28 DIAGNOSIS — Z992 Dependence on renal dialysis: Secondary | ICD-10-CM | POA: Diagnosis not present

## 2015-10-29 ENCOUNTER — Ambulatory Visit: Payer: Medicare HMO | Admitting: Adult Health

## 2015-10-30 DIAGNOSIS — N186 End stage renal disease: Secondary | ICD-10-CM | POA: Diagnosis not present

## 2015-10-30 DIAGNOSIS — Z992 Dependence on renal dialysis: Secondary | ICD-10-CM | POA: Diagnosis not present

## 2015-11-02 DIAGNOSIS — N186 End stage renal disease: Secondary | ICD-10-CM | POA: Diagnosis not present

## 2015-11-02 DIAGNOSIS — Z992 Dependence on renal dialysis: Secondary | ICD-10-CM | POA: Diagnosis not present

## 2015-11-04 DIAGNOSIS — Z992 Dependence on renal dialysis: Secondary | ICD-10-CM | POA: Diagnosis not present

## 2015-11-04 DIAGNOSIS — N186 End stage renal disease: Secondary | ICD-10-CM | POA: Diagnosis not present

## 2015-11-06 DIAGNOSIS — Z992 Dependence on renal dialysis: Secondary | ICD-10-CM | POA: Diagnosis not present

## 2015-11-06 DIAGNOSIS — N186 End stage renal disease: Secondary | ICD-10-CM | POA: Diagnosis not present

## 2015-11-09 DIAGNOSIS — N186 End stage renal disease: Secondary | ICD-10-CM | POA: Diagnosis not present

## 2015-11-09 DIAGNOSIS — Z992 Dependence on renal dialysis: Secondary | ICD-10-CM | POA: Diagnosis not present

## 2015-11-11 DIAGNOSIS — N186 End stage renal disease: Secondary | ICD-10-CM | POA: Diagnosis not present

## 2015-11-11 DIAGNOSIS — Z992 Dependence on renal dialysis: Secondary | ICD-10-CM | POA: Diagnosis not present

## 2015-11-14 DIAGNOSIS — Z992 Dependence on renal dialysis: Secondary | ICD-10-CM | POA: Diagnosis not present

## 2015-11-14 DIAGNOSIS — N186 End stage renal disease: Secondary | ICD-10-CM | POA: Diagnosis not present

## 2015-11-16 DIAGNOSIS — N186 End stage renal disease: Secondary | ICD-10-CM | POA: Diagnosis not present

## 2015-11-16 DIAGNOSIS — Z992 Dependence on renal dialysis: Secondary | ICD-10-CM | POA: Diagnosis not present

## 2015-11-18 DIAGNOSIS — Z992 Dependence on renal dialysis: Secondary | ICD-10-CM | POA: Diagnosis not present

## 2015-11-18 DIAGNOSIS — N186 End stage renal disease: Secondary | ICD-10-CM | POA: Diagnosis not present

## 2015-11-19 DIAGNOSIS — E039 Hypothyroidism, unspecified: Secondary | ICD-10-CM | POA: Diagnosis not present

## 2015-11-19 DIAGNOSIS — I4891 Unspecified atrial fibrillation: Secondary | ICD-10-CM | POA: Diagnosis not present

## 2015-11-19 DIAGNOSIS — I10 Essential (primary) hypertension: Secondary | ICD-10-CM | POA: Diagnosis not present

## 2015-11-19 DIAGNOSIS — Z992 Dependence on renal dialysis: Secondary | ICD-10-CM | POA: Diagnosis not present

## 2015-11-19 DIAGNOSIS — N185 Chronic kidney disease, stage 5: Secondary | ICD-10-CM | POA: Diagnosis not present

## 2015-11-19 DIAGNOSIS — I110 Hypertensive heart disease with heart failure: Secondary | ICD-10-CM | POA: Diagnosis not present

## 2015-11-20 DIAGNOSIS — N186 End stage renal disease: Secondary | ICD-10-CM | POA: Diagnosis not present

## 2015-11-20 DIAGNOSIS — Z992 Dependence on renal dialysis: Secondary | ICD-10-CM | POA: Diagnosis not present

## 2015-11-23 DIAGNOSIS — H04322 Acute dacryocystitis of left lacrimal passage: Secondary | ICD-10-CM | POA: Diagnosis not present

## 2015-11-23 DIAGNOSIS — Z992 Dependence on renal dialysis: Secondary | ICD-10-CM | POA: Diagnosis not present

## 2015-11-23 DIAGNOSIS — N186 End stage renal disease: Secondary | ICD-10-CM | POA: Diagnosis not present

## 2015-11-25 DIAGNOSIS — Z992 Dependence on renal dialysis: Secondary | ICD-10-CM | POA: Diagnosis not present

## 2015-11-25 DIAGNOSIS — N186 End stage renal disease: Secondary | ICD-10-CM | POA: Diagnosis not present

## 2015-11-27 DIAGNOSIS — N186 End stage renal disease: Secondary | ICD-10-CM | POA: Diagnosis not present

## 2015-11-27 DIAGNOSIS — Z992 Dependence on renal dialysis: Secondary | ICD-10-CM | POA: Diagnosis not present

## 2015-11-30 DIAGNOSIS — N186 End stage renal disease: Secondary | ICD-10-CM | POA: Diagnosis not present

## 2015-11-30 DIAGNOSIS — Z992 Dependence on renal dialysis: Secondary | ICD-10-CM | POA: Diagnosis not present

## 2015-11-30 DIAGNOSIS — H04322 Acute dacryocystitis of left lacrimal passage: Secondary | ICD-10-CM | POA: Diagnosis not present

## 2015-12-01 DIAGNOSIS — H04302 Unspecified dacryocystitis of left lacrimal passage: Secondary | ICD-10-CM | POA: Diagnosis not present

## 2015-12-02 DIAGNOSIS — H04322 Acute dacryocystitis of left lacrimal passage: Secondary | ICD-10-CM | POA: Diagnosis not present

## 2015-12-02 DIAGNOSIS — N186 End stage renal disease: Secondary | ICD-10-CM | POA: Diagnosis not present

## 2015-12-02 DIAGNOSIS — Z992 Dependence on renal dialysis: Secondary | ICD-10-CM | POA: Diagnosis not present

## 2015-12-04 DIAGNOSIS — N186 End stage renal disease: Secondary | ICD-10-CM | POA: Diagnosis not present

## 2015-12-04 DIAGNOSIS — Z992 Dependence on renal dialysis: Secondary | ICD-10-CM | POA: Diagnosis not present

## 2015-12-07 DIAGNOSIS — N186 End stage renal disease: Secondary | ICD-10-CM | POA: Diagnosis not present

## 2015-12-07 DIAGNOSIS — Z992 Dependence on renal dialysis: Secondary | ICD-10-CM | POA: Diagnosis not present

## 2015-12-08 DIAGNOSIS — H04302 Unspecified dacryocystitis of left lacrimal passage: Secondary | ICD-10-CM | POA: Diagnosis not present

## 2015-12-09 DIAGNOSIS — Z992 Dependence on renal dialysis: Secondary | ICD-10-CM | POA: Diagnosis not present

## 2015-12-09 DIAGNOSIS — N186 End stage renal disease: Secondary | ICD-10-CM | POA: Diagnosis not present

## 2015-12-11 ENCOUNTER — Telehealth: Payer: Self-pay | Admitting: *Deleted

## 2015-12-11 ENCOUNTER — Ambulatory Visit (INDEPENDENT_AMBULATORY_CARE_PROVIDER_SITE_OTHER): Payer: Medicare HMO | Admitting: Cardiovascular Disease

## 2015-12-11 ENCOUNTER — Encounter: Payer: Self-pay | Admitting: Cardiovascular Disease

## 2015-12-11 VITALS — BP 160/88 | HR 66 | Ht 61.0 in | Wt 133.0 lb

## 2015-12-11 DIAGNOSIS — I10 Essential (primary) hypertension: Secondary | ICD-10-CM | POA: Diagnosis not present

## 2015-12-11 DIAGNOSIS — R079 Chest pain, unspecified: Secondary | ICD-10-CM

## 2015-12-11 DIAGNOSIS — Z951 Presence of aortocoronary bypass graft: Secondary | ICD-10-CM

## 2015-12-11 DIAGNOSIS — I739 Peripheral vascular disease, unspecified: Secondary | ICD-10-CM | POA: Diagnosis not present

## 2015-12-11 DIAGNOSIS — I25768 Atherosclerosis of bypass graft of coronary artery of transplanted heart with other forms of angina pectoris: Secondary | ICD-10-CM

## 2015-12-11 DIAGNOSIS — R002 Palpitations: Secondary | ICD-10-CM

## 2015-12-11 DIAGNOSIS — N186 End stage renal disease: Secondary | ICD-10-CM

## 2015-12-11 DIAGNOSIS — I340 Nonrheumatic mitral (valve) insufficiency: Secondary | ICD-10-CM

## 2015-12-11 DIAGNOSIS — I6523 Occlusion and stenosis of bilateral carotid arteries: Secondary | ICD-10-CM | POA: Diagnosis not present

## 2015-12-11 DIAGNOSIS — Z992 Dependence on renal dialysis: Secondary | ICD-10-CM | POA: Diagnosis not present

## 2015-12-11 DIAGNOSIS — E7800 Pure hypercholesterolemia, unspecified: Secondary | ICD-10-CM

## 2015-12-11 DIAGNOSIS — I5032 Chronic diastolic (congestive) heart failure: Secondary | ICD-10-CM

## 2015-12-11 NOTE — Telephone Encounter (Signed)
Pt says having CP and feeling weak since last night. Took 2 nitroglycerins and ASA last night and this relieved pain. This morning having chest discomfort no pain - is currently at dialysis. Pt requesting to be seen today. Dialysis nurse says didn't feel that pt needed to report to ED at this point requesting pt be evaluated by cardiologist.  Will add on at 1pm for Dr. Bronson Ing

## 2015-12-11 NOTE — Patient Instructions (Signed)
   Lab for Troponin - order given to do now.  Will call with results. Continue all current medications. Follow up in  3 weeks

## 2015-12-11 NOTE — Telephone Encounter (Signed)
Notes Recorded by Laurine Blazer, LPN on [id number] at [id number] PM Patient notified and verbalized understanding. Copy to pmd.  Notes Recorded by Herminio Commons, MD on 12/11/2015 at 4:12 PM Likely demand ischemia. No change to plan. Pt already instructed to go to ED for chest pain requiring 3 nitroglycerin tablets.

## 2015-12-11 NOTE — Progress Notes (Signed)
Patient ID: Shawna [name], female   DOB: [birth year], 75 y.o.   MRN: [id number]      SUBJECTIVE: The patient presents for early follow-up as she has been experiencing chest pain since last night. She took 2 nitroglycerin tablets and aspirin which relieved the pain. She had chest discomfort this morning while at dialysis but did not take nitroglycerin.  She told me that her eldest daughter passed away of advanced cancer at the age of 69 two weeks ago. This was previously undiagnosed. She has been under a lot of anxiety and stress since that time.  ECG performed in the office today which I personally interpreted demonstrated sinus rhythm with left ventricular hypertrophy and consequent repolarization abnormalities.   Review of Systems: As per "subjective", otherwise negative.  Allergies  Allergen Reactions  . Aspirin Other (See Comments)    Mess up her stomach; "makes my bowels have blood in them". Takes 81 mg EC Aspirin   . Contrast Media [Iodinated Diagnostic Agents] Itching  . Iron Itching and Other (See Comments)    "they gave me iron in dialysis; had to give me Benadryl cause I had to have the iron" (05/02/2012)  . Penicillins Other (See Comments)    Current Outpatient Prescriptions  Medication Sig Dispense Refill  . acetaminophen (TYLENOL) 325 MG tablet Take 2 tablets (650 mg total) by mouth every 6 (six) hours as needed for mild pain (mild pain).    Marland Kitchen ALPRAZolam (XANAX) 0.5 MG tablet Take 1 tablet (0.5 mg total) by mouth 3 (three) times daily. 30 tablet 1  . amiodarone (PACERONE) 200 MG tablet Take 1 tablet (200 mg total) by mouth daily. 30 tablet 3  . amLODipine (NORVASC) 2.5 MG tablet Take 1 tablet (2.5 mg total) by mouth daily. 30 tablet 6  . aspirin EC 81 MG tablet Take 81 mg by mouth daily.     . bacitracin ophthalmic ointment     . benzonatate (TESSALON) 100 MG capsule Take 1 capsule (100 mg total) by mouth every 8 (eight) hours. 21 capsule 0  . cloNIDine (CATAPRES) 0.3 MG  tablet Take 1 tablet (0.3 mg total) by mouth 2 (two) times daily. (Patient taking differently: Take 0.3 mg by mouth daily. *May take an additional tablet based on blood pressure levels*) 60 tablet 6  . fluticasone (FLONASE) 50 MCG/ACT nasal spray Place 2 sprays into the nose daily.     . folic acid-vitamin b complex-vitamin c-selenium-zinc (DIALYVITE) 3 MG TABS Take 1 tablet by mouth daily.    . hydrALAZINE (APRESOLINE) 50 MG tablet Take 200 mg (4 pills) in the am, 100 mg ( 2 pills) at 2 pm, and 100 mg ( 2 pills) at 10 pm (Patient taking differently: Take 200 mg (4 pills) in the am, 100 mg ( 2 pills) at 2 pm, and 100 mg ( 2 pills) at 10 pm On NON-DIALYSIS DAYS) 240 tablet 3  . isosorbide dinitrate (ISORDIL) 30 MG tablet Take 1 tablet (30 mg total) by mouth 3 (three) times daily. 90 tablet 6  . labetalol (NORMODYNE) 200 MG tablet Take 1.5 tablets (300 mg total) by mouth 2 (two) times daily. 60 tablet 6  . lanthanum (FOSRENOL) 1000 MG chewable tablet Chew 1,000 mg by mouth 3 (three) times daily after meals.    Marland Kitchen loratadine (CLARITIN) 10 MG tablet Take 10 mg by mouth daily.     . nitroGLYCERIN (NITROSTAT) 0.4 MG SL tablet Place 1 tablet (0.4 mg total) under the tongue every 5 (five)  minutes x 3 doses as needed for chest pain. 25 tablet 3  . omeprazole (PRILOSEC) 20 MG capsule Take 1 capsule (20 mg total) by mouth daily. 30 capsule 5  . ondansetron (ZOFRAN-ODT) 4 MG disintegrating tablet Take 4 mg by mouth every 8 (eight) hours as needed for nausea or vomiting. nausea    . PROAIR HFA 108 (90 BASE) MCG/ACT inhaler Inhale 1 puff into the lungs every 6 (six) hours as needed for wheezing or shortness of breath.     . SENSIPAR 30 MG tablet Take 30 mg by mouth daily with supper.     . simvastatin (ZOCOR) 20 MG tablet TAKE ONE TABLET BY MOUTH AT BEDTIME 30 tablet 6  . tetrahydrozoline 0.05 % ophthalmic solution Place 1 drop into both eyes 2 (two) times daily as needed (irritation).     No current  facility-administered medications for this visit.    Past Medical History  Diagnosis Date  . Chronic bronchitis ([place])   . GERD (gastroesophageal reflux disease)   . PUD (peptic ulcer disease)   . History of lower GI bleeding   . Arthritis   . History of gout   . CAD (coronary artery disease)     a. 12/2011 NSTEMI/Cath/PCI LCX (2.25x14 Resolute DES) & D1 (2.25x22 Resolute DES);  b. 01/2012 Cath/PCI: LM 30, LAD 30p, 40-42m, D1 stent ok, 99 in sm branch of diag, LCX patent stent, OM1 20, RCA 95 ost (4.0x12 Promus DES), EF 55%;  c. 04/2012 Lexi Cardiolite  EF 48%, small area of scar @ base/mid inflat wall with mild peri-infarct ischemia.; CABG 12/4  . High cholesterol 12/2011  . Pneumonia ~ 2009  . Iron deficiency anemia   . TIA (transient ischemic attack)   . Anxiety   . History of blood transfusion 07/2011; 12/2011; 01/2012 X 2; 04/2012  . Carotid artery disease ([place])     a. A999333 LICA, Q000111Q   . Mitral regurgitation     a. Moderate by echo, 02/2012  . Myocardial infarction ([place])   . Chronic diastolic CHF (congestive heart failure) ([place])     a. 02/2012 Echo EF 60-65%, nl wall motion, Gr 1 DD, mod MR  . Hypertension   . AVM (arteriovenous malformation) of colon   . Esophageal stricture   . Ovarian cancer ([place]) 1992  . Colon cancer ([place]) 1992  . ESRD on hemodialysis ([place])     ESRD due to HTN, started dialysis 2011 and gets HD at [facility] with Dr Hinda Lenis on MWF schedule.  Access is LUA AVF as of Sept 2014.     Past Surgical History  Procedure Laterality Date  . Abdominal hysterectomy  1992  . Appendectomy  06/1990  . Tubal ligation  1980's  . Av fistula placement  07/2009    left upper arm  . Thrombectomy / arteriovenous graft revision  2011    left upper arm  . Colon resection  1992  . Esophagogastroduodenoscopy  01/20/2012    Procedure: ESOPHAGOGASTRODUODENOSCOPY (EGD);  Surgeon: Ladene Artist, MD,FACG;  Location: [facility] ENDOSCOPY;  Service: Endoscopy;  Laterality: N/A;  .  Dilation and curettage of uterus    . Coronary angioplasty with stent placement  12/15/11    "2"  . Coronary angioplasty with stent placement  y/2013    "1; makes total of 3" (05/02/2012)  . Coronary artery bypass graft  06/13/2012    Procedure: CORONARY ARTERY BYPASS GRAFTING (CABG);  Surgeon: Grace Isaac, MD;  Location: [place];  Service:  Open Heart Surgery;  Laterality: N/A;  cabg x four;  using left internal mammary artery, and left leg greater saphenous vein harvested endoscopically  . Intraoperative transesophageal echocardiogram  06/13/2012    Procedure: INTRAOPERATIVE TRANSESOPHAGEAL ECHOCARDIOGRAM;  Surgeon: Grace Isaac, MD;  Location: [place];  Service: Open Heart Surgery;  Laterality: N/A;  . Esophagogastroduodenoscopy N/A 03/26/2013    Procedure: ESOPHAGOGASTRODUODENOSCOPY (EGD);  Surgeon: Irene Shipper, MD;  Location: [facility] ENDOSCOPY;  Service: Endoscopy;  Laterality: N/A;  . Ovary surgery      ovarian cancer  . Left heart catheterization with coronary angiogram N/A 12/15/2011    Procedure: LEFT HEART CATHETERIZATION WITH CORONARY ANGIOGRAM;  Surgeon: Burnell Blanks, MD;  Location: [facility] CATH LAB;  Service: Cardiovascular;  Laterality: N/A;  . Left heart catheterization with coronary angiogram N/A 01/10/2012    Procedure: LEFT HEART CATHETERIZATION WITH CORONARY ANGIOGRAM;  Surgeon: Peter M Martinique, MD;  Location: [facility] CATH LAB;  Service: Cardiovascular;  Laterality: N/A;  . Left heart catheterization with coronary angiogram N/A 06/08/2012    Procedure: LEFT HEART CATHETERIZATION WITH CORONARY ANGIOGRAM;  Surgeon: Burnell Blanks, MD;  Location: [facility] CATH LAB;  Service: Cardiovascular;  Laterality: N/A;  . Shuntogram N/A 10/15/2013    Procedure: Fistulogram;  Surgeon: Serafina Mitchell, MD;  Location: [facility] CATH LAB;  Service: Cardiovascular;  Laterality: N/A;  . Left heart catheterization with coronary/graft angiogram N/A 12/10/2013    Procedure: LEFT HEART CATHETERIZATION WITH  Beatrix Fetters;  Surgeon: [name], MD;  Location: [facility] CATH LAB;  Service: Cardiovascular;  Laterality: N/A;  . Esophagogastroduodenoscopy N/A 04/30/2015    Procedure: ESOPHAGOGASTRODUODENOSCOPY (EGD);  Surgeon: Rogene Houston, MD;  [facility] ENDO SU[facility];  Service: Endoscopy;  Laterality: N/A;  1pm - moved to 10/20 @ 1:10    Social History   Social History  . Marital Status: Married    Spouse Name: N/A  . Number of Children: N/A  . Years of Education: N/A   Occupational History  . Not on file.   Social History Main Topics  . Smoking status: Never Smoker   . Smokeless tobacco: Never Used  . Alcohol Use: No  . Drug Use: No  . Sexual Activity: Yes    Birth Control/ Protection: Surgical   Other Topics Concern  . Not on file   Social History Narrative   Lives in [place], [place] with husband.  Dialysis pt - [facility].     Filed Vitals:   12/11/15 1317  BP: 160/88  Pulse: 66  Height: 5\' 1"  (1.549 m)  Weight: 133 lb (60.328 kg)  SpO2: 98%    PHYSICAL EXAM General: NAD HEENT: Normal. Neck: No JVD, no thyromegaly. Lungs: Clear to auscultation bilaterally with normal respiratory effort. CV: Nondisplaced PMI.  Regular rate and rhythm, normal S1/S2, no S3/S4, no murmur. No pretibial or periankle edema.    Abdomen: Soft, nontender, no distention.  Neurologic: Alert and oriented.  Psych: Normal affect. Skin: Normal. Musculoskeletal: No gross deformities.    ECG: Most recent ECG reviewed.      ASSESSMENT AND PLAN: 1. Chest pain in context of CAD s/p CABG:Likely related to anxiety and grief over the loss of her daughter. Will check a serum troponin just to be certain. I have instructed her to go to the ED should chest pain recur and she requires 3 nitroglycerin tablets to alleviate it. Normal nuclear stress test (08/25/14). I will continue ASA, labetalol, isosorbide dinitrate 30 mg tid, amlodipine, and statin.   2.  Malignant HTN: Elevated under  aforementioned circumstances. She is taking labetalol 300 mg twice daily, clonidine 0.3 mg daily, and doses of hydralazine which I did not prescribe. She is also on isosorbide dinitrate 30 mg 3 times daily. I previously instructed her not to take the afternoon doses on dialysis days when her BP occasionally drops.  On 2.5 mg amlodipine because 5 mg caused feet swelling. She will bring in her BP cuff to make sure it is calibrated appropriately.  3. Hyperlipidemia: Continue statin therapy.  4. Mitral regurgitation: Moderate in 09/2015. Will monitor.  5. Carotid artery stenosis: Less than 40% bilaterally on 08/14/14. Continue surveillance monitoring.   6. PSVT: Stable on amiodarone. No changes.  Dispo: f/u 3 weeks.   Kate Sable, M.D., F.A.C.C.

## 2015-12-14 DIAGNOSIS — N186 End stage renal disease: Secondary | ICD-10-CM | POA: Diagnosis not present

## 2015-12-14 DIAGNOSIS — Z992 Dependence on renal dialysis: Secondary | ICD-10-CM | POA: Diagnosis not present

## 2015-12-16 DIAGNOSIS — Z992 Dependence on renal dialysis: Secondary | ICD-10-CM | POA: Diagnosis not present

## 2015-12-16 DIAGNOSIS — N186 End stage renal disease: Secondary | ICD-10-CM | POA: Diagnosis not present

## 2015-12-18 DIAGNOSIS — Z992 Dependence on renal dialysis: Secondary | ICD-10-CM | POA: Diagnosis not present

## 2015-12-18 DIAGNOSIS — N186 End stage renal disease: Secondary | ICD-10-CM | POA: Diagnosis not present

## 2015-12-21 DIAGNOSIS — N186 End stage renal disease: Secondary | ICD-10-CM | POA: Diagnosis not present

## 2015-12-21 DIAGNOSIS — Z992 Dependence on renal dialysis: Secondary | ICD-10-CM | POA: Diagnosis not present

## 2015-12-23 DIAGNOSIS — Z992 Dependence on renal dialysis: Secondary | ICD-10-CM | POA: Diagnosis not present

## 2015-12-23 DIAGNOSIS — N186 End stage renal disease: Secondary | ICD-10-CM | POA: Diagnosis not present

## 2015-12-25 DIAGNOSIS — Z992 Dependence on renal dialysis: Secondary | ICD-10-CM | POA: Diagnosis not present

## 2015-12-25 DIAGNOSIS — N186 End stage renal disease: Secondary | ICD-10-CM | POA: Diagnosis not present

## 2015-12-28 DIAGNOSIS — Z992 Dependence on renal dialysis: Secondary | ICD-10-CM | POA: Diagnosis not present

## 2015-12-28 DIAGNOSIS — N186 End stage renal disease: Secondary | ICD-10-CM | POA: Diagnosis not present

## 2015-12-30 DIAGNOSIS — N186 End stage renal disease: Secondary | ICD-10-CM | POA: Diagnosis not present

## 2015-12-30 DIAGNOSIS — Z992 Dependence on renal dialysis: Secondary | ICD-10-CM | POA: Diagnosis not present

## 2016-01-01 DIAGNOSIS — N186 End stage renal disease: Secondary | ICD-10-CM | POA: Diagnosis not present

## 2016-01-01 DIAGNOSIS — Z992 Dependence on renal dialysis: Secondary | ICD-10-CM | POA: Diagnosis not present

## 2016-01-04 DIAGNOSIS — N186 End stage renal disease: Secondary | ICD-10-CM | POA: Diagnosis not present

## 2016-01-04 DIAGNOSIS — Z992 Dependence on renal dialysis: Secondary | ICD-10-CM | POA: Diagnosis not present

## 2016-01-06 ENCOUNTER — Ambulatory Visit (INDEPENDENT_AMBULATORY_CARE_PROVIDER_SITE_OTHER): Payer: Medicare HMO | Admitting: Cardiovascular Disease

## 2016-01-06 ENCOUNTER — Encounter: Payer: Self-pay | Admitting: Cardiovascular Disease

## 2016-01-06 VITALS — BP 148/78 | HR 64 | Ht 61.0 in | Wt 139.0 lb

## 2016-01-06 DIAGNOSIS — E7800 Pure hypercholesterolemia, unspecified: Secondary | ICD-10-CM

## 2016-01-06 DIAGNOSIS — Z951 Presence of aortocoronary bypass graft: Secondary | ICD-10-CM

## 2016-01-06 DIAGNOSIS — R079 Chest pain, unspecified: Secondary | ICD-10-CM

## 2016-01-06 DIAGNOSIS — I25768 Atherosclerosis of bypass graft of coronary artery of transplanted heart with other forms of angina pectoris: Secondary | ICD-10-CM

## 2016-01-06 DIAGNOSIS — Z992 Dependence on renal dialysis: Secondary | ICD-10-CM | POA: Diagnosis not present

## 2016-01-06 DIAGNOSIS — N186 End stage renal disease: Secondary | ICD-10-CM

## 2016-01-06 DIAGNOSIS — I340 Nonrheumatic mitral (valve) insufficiency: Secondary | ICD-10-CM

## 2016-01-06 DIAGNOSIS — I10 Essential (primary) hypertension: Secondary | ICD-10-CM

## 2016-01-06 DIAGNOSIS — I6523 Occlusion and stenosis of bilateral carotid arteries: Secondary | ICD-10-CM | POA: Diagnosis not present

## 2016-01-06 DIAGNOSIS — I5032 Chronic diastolic (congestive) heart failure: Secondary | ICD-10-CM

## 2016-01-06 NOTE — Patient Instructions (Signed)
Your physician wants you to follow-up in: 6 MONTHS WITH DR KONESWARAN You will receive a reminder letter in the mail two months in advance. If you don't receive a letter, please call our office to schedule the follow-up appointment.  Your physician recommends that you continue on your current medications as directed. Please refer to the Current Medication list given to you today.  Thank you for choosing  [facility]!!    

## 2016-01-06 NOTE — Progress Notes (Signed)
Patient ID: Shawna [name], female   DOB: Jan 07, 1941, 75 y.o.   MRN: [id number]      SUBJECTIVE: The patient presents for follow up of chest pain. Troponin 0.05 at last office visit, likely demand ischemia. BP better controlled today.  She told me at her last visit that her eldest daughter passed away of advanced cancer at the age of 75 two weeks prior. This was previously undiagnosed.  Due to that she was under a lot of anxiety and stress.  BP spikes when she thinks about her. Had chest pain last Saturday alleviated with 2 SL nitro and one ASA.   Review of Systems: As per "subjective", otherwise negative.  Allergies  Allergen Reactions  . Aspirin Other (See Comments)    Mess up her stomach; "makes my bowels have blood in them". Takes 81 mg EC Aspirin   . Contrast Media [Iodinated Diagnostic Agents] Itching  . Iron Itching and Other (See Comments)    "they gave me iron in dialysis; had to give me Benadryl cause I had to have the iron" (05/02/2012)  . Penicillins Other (See Comments)    Current Outpatient Prescriptions  Medication Sig Dispense Refill  . acetaminophen (TYLENOL) 325 MG tablet Take 2 tablets (650 mg total) by mouth every 6 (six) hours as needed for mild pain (mild pain).    Marland Kitchen ALPRAZolam (XANAX) 0.5 MG tablet Take 1 tablet (0.5 mg total) by mouth 3 (three) times daily. 30 tablet 1  . amiodarone (PACERONE) 200 MG tablet Take 1 tablet (200 mg total) by mouth daily. 30 tablet 3  . amLODipine (NORVASC) 2.5 MG tablet Take 1 tablet (2.5 mg total) by mouth daily. 30 tablet 6  . aspirin EC 81 MG tablet Take 81 mg by mouth daily.     . bacitracin ophthalmic ointment     . benzonatate (TESSALON) 100 MG capsule Take 1 capsule (100 mg total) by mouth every 8 (eight) hours. 21 capsule 0  . cloNIDine (CATAPRES) 0.3 MG tablet Take 1 tablet (0.3 mg total) by mouth 2 (two) times daily. (Patient taking differently: Take 0.3 mg by mouth daily. *May take an additional tablet based on blood  pressure levels*) 60 tablet 6  . fluticasone (FLONASE) 50 MCG/ACT nasal spray Place 2 sprays into the nose daily.     . folic acid-vitamin b complex-vitamin c-selenium-zinc (DIALYVITE) 3 MG TABS Take 1 tablet by mouth daily.    . hydrALAZINE (APRESOLINE) 50 MG tablet Take 200 mg (4 pills) in the am, 100 mg ( 2 pills) at 2 pm, and 100 mg ( 2 pills) at 10 pm (Patient taking differently: Take 200 mg (4 pills) in the am, 100 mg ( 2 pills) at 2 pm, and 100 mg ( 2 pills) at 10 pm On NON-DIALYSIS DAYS) 240 tablet 3  . isosorbide dinitrate (ISORDIL) 30 MG tablet Take 1 tablet (30 mg total) by mouth 3 (three) times daily. 90 tablet 6  . labetalol (NORMODYNE) 200 MG tablet Take 1.5 tablets (300 mg total) by mouth 2 (two) times daily. 60 tablet 6  . lanthanum (FOSRENOL) 1000 MG chewable tablet Chew 1,000 mg by mouth 3 (three) times daily after meals.    Marland Kitchen loratadine (CLARITIN) 10 MG tablet Take 10 mg by mouth daily.     . nitroGLYCERIN (NITROSTAT) 0.4 MG SL tablet Place 1 tablet (0.4 mg total) under the tongue every 5 (five) minutes x 3 doses as needed for chest pain. 25 tablet 3  . omeprazole (PRILOSEC)  20 MG capsule Take 1 capsule (20 mg total) by mouth daily. 30 capsule 5  . ondansetron (ZOFRAN-ODT) 4 MG disintegrating tablet Take 4 mg by mouth every 8 (eight) hours as needed for nausea or vomiting. nausea    . PROAIR HFA 108 (90 BASE) MCG/ACT inhaler Inhale 1 puff into the lungs every 6 (six) hours as needed for wheezing or shortness of breath.     . SENSIPAR 30 MG tablet Take 30 mg by mouth daily with supper.     . simvastatin (ZOCOR) 20 MG tablet TAKE ONE TABLET BY MOUTH AT BEDTIME 30 tablet 6  . tetrahydrozoline 0.05 % ophthalmic solution Place 1 drop into both eyes 2 (two) times daily as needed (irritation).     No current facility-administered medications for this visit.    Past Medical History  Diagnosis Date  . Chronic bronchitis ([place])   . GERD (gastroesophageal reflux disease)   . PUD (peptic  ulcer disease)   . History of lower GI bleeding   . Arthritis   . History of gout   . CAD (coronary artery disease)     a. 12/2011 NSTEMI/Cath/PCI LCX (2.25x14 Resolute DES) & D1 (2.25x22 Resolute DES);  b. 01/2012 Cath/PCI: LM 30, LAD 30p, 40-13m, D1 stent ok, 99 in sm branch of diag, LCX patent stent, OM1 20, RCA 95 ost (4.0x12 Promus DES), EF 55%;  c. 04/2012 Lexi Cardiolite  EF 48%, small area of scar @ base/mid inflat wall with mild peri-infarct ischemia.; CABG 12/4  . High cholesterol 12/2011  . Pneumonia ~ 2009  . Iron deficiency anemia   . TIA (transient ischemic attack)   . Anxiety   . History of blood transfusion 07/2011; 12/2011; 01/2012 X 2; 04/2012  . Carotid artery disease ([place])     a. [id number] LICA, [id number]   . Mitral regurgitation     a. Moderate by echo, 02/2012  . Myocardial infarction ([place])   . Chronic diastolic CHF (congestive heart failure) ([place])     a. 02/2012 Echo EF 60-65%, nl wall motion, Gr 1 DD, mod MR  . Hypertension   . AVM (arteriovenous malformation) of colon   . Esophageal stricture   . Ovarian cancer ([place]) 1992  . Colon cancer ([place]) 1992  . ESRD on hemodialysis ([place])     ESRD due to HTN, started dialysis 2011 and gets HD at [facility] with Dr Hinda Lenis on MWF schedule.  Access is LUA AVF as of Sept 2014.     Past Surgical History  Procedure Laterality Date  . Abdominal hysterectomy  1992  . Appendectomy  06/1990  . Tubal ligation  1980's  . Av fistula placement  07/2009    left upper arm  . Thrombectomy / arteriovenous graft revision  2011    left upper arm  . Colon resection  1992  . Esophagogastroduodenoscopy  01/20/2012    Procedure: ESOPHAGOGASTRODUODENOSCOPY (EGD);  Surgeon: Ladene Artist, MD,FACG;  Location: [facility] ENDOSCOPY;  Service: Endoscopy;  Laterality: N/A;  . Dilation and curettage of uterus    . Coronary angioplasty with stent placement  12/15/11    "2"  . Coronary angioplasty with stent placement  y/2013    "1; makes total of 3" (05/02/2012)   . Coronary artery bypass graft  06/13/2012    Procedure: CORONARY ARTERY BYPASS GRAFTING (CABG);  Surgeon: Grace Isaac, MD;  Location: [place];  Service: Open Heart Surgery;  Laterality: N/A;  cabg x four;  using left internal mammary artery,  and left leg greater saphenous vein harvested endoscopically  . Intraoperative transesophageal echocardiogram  06/13/2012    Procedure: INTRAOPERATIVE TRANSESOPHAGEAL ECHOCARDIOGRAM;  Surgeon: Grace Isaac, MD;  Location: [place];  Service: Open Heart Surgery;  Laterality: N/A;  . Esophagogastroduodenoscopy N/A 03/26/2013    Procedure: ESOPHAGOGASTRODUODENOSCOPY (EGD);  Surgeon: Irene Shipper, MD;  Location: [facility] ENDOSCOPY;  Service: Endoscopy;  Laterality: N/A;  . [place]vary surgery      ovarian cancer  . Left heart catheterization with coronary angiogram N/A 12/15/2011    Procedure: LEFT HEART CATHETERIZATION WITH CORONARY ANGIOGRAM;  Surgeon: Burnell Blanks, MD;  Location: [facility] CATH LAB;  Service: Cardiovascular;  Laterality: N/A;  . Left heart catheterization with coronary angiogram N/A 01/10/2012    Procedure: LEFT HEART CATHETERIZATION WITH CORONARY ANGIOGRAM;  Surgeon: Peter M Martinique, MD;  Location: [facility] CATH LAB;  Service: Cardiovascular;  Laterality: N/A;  . Left heart catheterization with coronary angiogram N/A 06/08/2012    Procedure: LEFT HEART CATHETERIZATION WITH CORONARY ANGIOGRAM;  Surgeon: Burnell Blanks, MD;  Location: [facility] CATH LAB;  Service: Cardiovascular;  Laterality: N/A;  . Shuntogram N/A 10/15/2013    Procedure: Fistulogram;  Surgeon: Serafina Mitchell, MD;  Location: [name] CATH LAB;  Service: Cardiovascular;  Laterality: N/A;  . Left heart catheterization with coronary/graft angiogram N/A 12/10/2013    Procedure: LEFT HEART CATHETERIZATION WITH Beatrix Fetters;  Surgeon: [name], MD;  Location: [facility] CATH LAB;  Service: Cardiovascular;  Laterality: N/A;  . Esophagogastroduodenoscopy N/A 04/30/2015    Procedure:  ESOPHAGOGASTRODUODENOSCOPY (EGD);  Surgeon: Rogene Houston, MD;  Location: [facility] ENDO [facility];  Service: Endoscopy;  Laterality: N/A;  1pm - moved to 10/20 @ 1:10    Social History   Social History  . Marital Status: Married    Spouse Name: N/A  . Number of Children: N/A  . Years of Education: N/A   Occupational History  . Not on file.   Social History Main Topics  . Smoking status: Never Smoker   . Smokeless tobacco: Never Used  . Alcohol Use: No  . Drug Use: No  . Sexual Activity: Yes    Birth Control/ Protection: Surgical   Other Topics Concern  . Not on file   Social History Narrative   Lives in [place], [place] with husband.  Dialysis pt - [facility].     Filed Vitals:   01/06/16 1456  BP: 148/78  Pulse: 64  Height: 5\' 1"  (1.549 m)  Weight: 139 lb (63.05 kg)  SpO2: 97%    PHYSICAL EXAM General: NAD HEENT: Normal. Neck: No JVD, no thyromegaly. Lungs: Clear to auscultation bilaterally with normal respiratory effort. CV: Nondisplaced PMI.  Regular rate and rhythm, normal S1/S2, no S3/S4, soft aortic systolic murmur. No pretibial or periankle edema.    Abdomen: Soft, nontender, no distention.  Neurologic: Alert and oriented.  Psych: Normal affect. Skin: Normal. Musculoskeletal: No gross deformities.    ECG: Most recent ECG reviewed.      ASSESSMENT AND PLAN: 1. Chest pain in context of CAD s/p CABG:Likely related to anxiety and grief over the loss of her daughter. Normal nuclear stress test (08/25/14). I will continue ASA, labetalol, isosorbide dinitrate 30 mg tid, amlodipine, and statin.   2. Malignant HTN: BP better controlled. No changes.  3. Hyperlipidemia: Continue statin therapy.  4. Mitral regurgitation: Moderate in 09/2015. Will monitor.  5. Carotid artery stenosis: Less than 40% bilaterally on 08/14/14. Continue surveillance monitoring.   6. PSVT: Stable on amiodarone. No changes.  Dispo: f/u 6 months.   Kate Sable, M.D., F.A.C.C.

## 2016-01-08 DIAGNOSIS — N186 End stage renal disease: Secondary | ICD-10-CM | POA: Diagnosis not present

## 2016-01-08 DIAGNOSIS — Z992 Dependence on renal dialysis: Secondary | ICD-10-CM | POA: Diagnosis not present

## 2016-01-11 DIAGNOSIS — T82898A Other specified complication of vascular prosthetic devices, implants and grafts, initial encounter: Secondary | ICD-10-CM | POA: Diagnosis not present

## 2016-01-11 DIAGNOSIS — Z992 Dependence on renal dialysis: Secondary | ICD-10-CM | POA: Diagnosis not present

## 2016-01-11 DIAGNOSIS — N186 End stage renal disease: Secondary | ICD-10-CM | POA: Diagnosis not present

## 2016-01-13 DIAGNOSIS — N186 End stage renal disease: Secondary | ICD-10-CM | POA: Diagnosis not present

## 2016-01-13 DIAGNOSIS — Z992 Dependence on renal dialysis: Secondary | ICD-10-CM | POA: Diagnosis not present

## 2016-01-13 DIAGNOSIS — T82898A Other specified complication of vascular prosthetic devices, implants and grafts, initial encounter: Secondary | ICD-10-CM | POA: Diagnosis not present

## 2016-01-15 DIAGNOSIS — Z992 Dependence on renal dialysis: Secondary | ICD-10-CM | POA: Diagnosis not present

## 2016-01-15 DIAGNOSIS — T82898A Other specified complication of vascular prosthetic devices, implants and grafts, initial encounter: Secondary | ICD-10-CM | POA: Diagnosis not present

## 2016-01-15 DIAGNOSIS — N186 End stage renal disease: Secondary | ICD-10-CM | POA: Diagnosis not present

## 2016-01-18 DIAGNOSIS — N186 End stage renal disease: Secondary | ICD-10-CM | POA: Diagnosis not present

## 2016-01-18 DIAGNOSIS — Z992 Dependence on renal dialysis: Secondary | ICD-10-CM | POA: Diagnosis not present

## 2016-01-18 DIAGNOSIS — T82898A Other specified complication of vascular prosthetic devices, implants and grafts, initial encounter: Secondary | ICD-10-CM | POA: Diagnosis not present

## 2016-01-20 ENCOUNTER — Other Ambulatory Visit: Payer: Self-pay | Admitting: Cardiovascular Disease

## 2016-01-20 DIAGNOSIS — Z992 Dependence on renal dialysis: Secondary | ICD-10-CM | POA: Diagnosis not present

## 2016-01-20 DIAGNOSIS — N186 End stage renal disease: Secondary | ICD-10-CM | POA: Diagnosis not present

## 2016-01-20 DIAGNOSIS — T82898A Other specified complication of vascular prosthetic devices, implants and grafts, initial encounter: Secondary | ICD-10-CM | POA: Diagnosis not present

## 2016-01-22 DIAGNOSIS — N186 End stage renal disease: Secondary | ICD-10-CM | POA: Diagnosis not present

## 2016-01-22 DIAGNOSIS — T82898A Other specified complication of vascular prosthetic devices, implants and grafts, initial encounter: Secondary | ICD-10-CM | POA: Diagnosis not present

## 2016-01-22 DIAGNOSIS — Z992 Dependence on renal dialysis: Secondary | ICD-10-CM | POA: Diagnosis not present

## 2016-01-25 DIAGNOSIS — N186 End stage renal disease: Secondary | ICD-10-CM | POA: Diagnosis not present

## 2016-01-25 DIAGNOSIS — T82898A Other specified complication of vascular prosthetic devices, implants and grafts, initial encounter: Secondary | ICD-10-CM | POA: Diagnosis not present

## 2016-01-25 DIAGNOSIS — Z992 Dependence on renal dialysis: Secondary | ICD-10-CM | POA: Diagnosis not present

## 2016-01-27 ENCOUNTER — Encounter (HOSPITAL_COMMUNITY): Payer: Self-pay | Admitting: Emergency Medicine

## 2016-01-27 ENCOUNTER — Telehealth: Payer: Self-pay | Admitting: *Deleted

## 2016-01-27 ENCOUNTER — Emergency Department (HOSPITAL_COMMUNITY): Payer: Medicare HMO

## 2016-01-27 ENCOUNTER — Inpatient Hospital Stay (HOSPITAL_COMMUNITY)
Admission: EM | Admit: 2016-01-27 | Discharge: 2016-01-28 | DRG: 304 | Disposition: A | Discharge status: Home / Self Care | Payer: Medicare HMO | Attending: Internal Medicine | Admitting: Internal Medicine

## 2016-01-27 DIAGNOSIS — Z9119 Patient's noncompliance with other medical treatment and regimen: Secondary | ICD-10-CM | POA: Diagnosis not present

## 2016-01-27 DIAGNOSIS — I252 Old myocardial infarction: Secondary | ICD-10-CM

## 2016-01-27 DIAGNOSIS — K219 Gastro-esophageal reflux disease without esophagitis: Secondary | ICD-10-CM | POA: Diagnosis present

## 2016-01-27 DIAGNOSIS — Z90710 Acquired absence of both cervix and uterus: Secondary | ICD-10-CM | POA: Diagnosis not present

## 2016-01-27 DIAGNOSIS — Z8701 Personal history of pneumonia (recurrent): Secondary | ICD-10-CM

## 2016-01-27 DIAGNOSIS — Z951 Presence of aortocoronary bypass graft: Secondary | ICD-10-CM | POA: Diagnosis not present

## 2016-01-27 DIAGNOSIS — I132 Hypertensive heart and chronic kidney disease with heart failure and with stage 5 chronic kidney disease, or end stage renal disease: Secondary | ICD-10-CM | POA: Diagnosis present

## 2016-01-27 DIAGNOSIS — I2510 Atherosclerotic heart disease of native coronary artery without angina pectoris: Secondary | ICD-10-CM | POA: Diagnosis present

## 2016-01-27 DIAGNOSIS — Z8673 Personal history of transient ischemic attack (TIA), and cerebral infarction without residual deficits: Secondary | ICD-10-CM

## 2016-01-27 DIAGNOSIS — D631 Anemia in chronic kidney disease: Secondary | ICD-10-CM | POA: Diagnosis present

## 2016-01-27 DIAGNOSIS — R0602 Shortness of breath: Secondary | ICD-10-CM | POA: Diagnosis not present

## 2016-01-27 DIAGNOSIS — R1013 Epigastric pain: Secondary | ICD-10-CM | POA: Diagnosis not present

## 2016-01-27 DIAGNOSIS — Z992 Dependence on renal dialysis: Secondary | ICD-10-CM | POA: Diagnosis not present

## 2016-01-27 DIAGNOSIS — R079 Chest pain, unspecified: Secondary | ICD-10-CM | POA: Diagnosis not present

## 2016-01-27 DIAGNOSIS — Z85038 Personal history of other malignant neoplasm of large intestine: Secondary | ICD-10-CM

## 2016-01-27 DIAGNOSIS — I160 Hypertensive urgency: Secondary | ICD-10-CM | POA: Diagnosis not present

## 2016-01-27 DIAGNOSIS — Z8249 Family history of ischemic heart disease and other diseases of the circulatory system: Secondary | ICD-10-CM

## 2016-01-27 DIAGNOSIS — Z8543 Personal history of malignant neoplasm of ovary: Secondary | ICD-10-CM | POA: Diagnosis not present

## 2016-01-27 DIAGNOSIS — I169 Hypertensive crisis, unspecified: Secondary | ICD-10-CM

## 2016-01-27 DIAGNOSIS — Z833 Family history of diabetes mellitus: Secondary | ICD-10-CM

## 2016-01-27 DIAGNOSIS — E7800 Pure hypercholesterolemia, unspecified: Secondary | ICD-10-CM | POA: Diagnosis present

## 2016-01-27 DIAGNOSIS — I5032 Chronic diastolic (congestive) heart failure: Secondary | ICD-10-CM | POA: Diagnosis not present

## 2016-01-27 DIAGNOSIS — N186 End stage renal disease: Secondary | ICD-10-CM | POA: Diagnosis present

## 2016-01-27 DIAGNOSIS — R109 Unspecified abdominal pain: Secondary | ICD-10-CM | POA: Diagnosis not present

## 2016-01-27 DIAGNOSIS — T82898A Other specified complication of vascular prosthetic devices, implants and grafts, initial encounter: Secondary | ICD-10-CM | POA: Diagnosis not present

## 2016-01-27 DIAGNOSIS — I10 Essential (primary) hypertension: Secondary | ICD-10-CM | POA: Diagnosis not present

## 2016-01-27 LAB — COMPREHENSIVE METABOLIC PANEL
ALK PHOS: 72 U/L (ref 38–126)
ALT: 16 U/L (ref 14–54)
AST: 22 U/L (ref 15–41)
Albumin: 3.8 g/dL (ref 3.5–5.0)
Anion gap: 10 (ref 5–15)
BUN: 18 mg/dL (ref 6–20)
CALCIUM: 8.7 mg/dL — AB (ref 8.9–10.3)
CHLORIDE: 96 mmol/L — AB (ref 101–111)
CO2: 28 mmol/L (ref 22–32)
CREATININE: 5.82 mg/dL — AB (ref 0.44–1.00)
GFR, EST AFRICAN AMERICAN: 7 mL/min — AB (ref >60)
GFR, EST NON AFRICAN AMERICAN: 6 mL/min — AB (ref >60)
Glucose, Bld: 82 mg/dL (ref 65–99)
Potassium: 3.8 mmol/L (ref 3.5–5.1)
Sodium: 134 mmol/L — ABNORMAL LOW (ref 135–145)
Total Bilirubin: 0.5 mg/dL (ref 0.3–1.2)
Total Protein: 6.4 g/dL — ABNORMAL LOW (ref 6.5–8.1)

## 2016-01-27 LAB — CBC
HEMATOCRIT: 35.4 % — AB (ref 36.0–46.0)
HEMOGLOBIN: 11.3 g/dL — AB (ref 12.0–15.0)
MCH: 32.4 pg (ref 26.0–34.0)
MCHC: 31.9 g/dL (ref 30.0–36.0)
MCV: 101.4 fL — AB (ref 78.0–100.0)
Platelets: 267 10*3/uL (ref 150–400)
RBC: 3.49 MIL/uL — AB (ref 3.87–5.11)
RDW: 17.5 % — ABNORMAL HIGH (ref 11.5–15.5)
WBC: 6.9 10*3/uL (ref 4.0–10.5)

## 2016-01-27 LAB — PROTIME-INR
INR: 1.02 (ref 0.00–1.49)
PROTHROMBIN TIME: 13.6 s (ref 11.6–15.2)

## 2016-01-27 LAB — APTT: aPTT: 34 seconds (ref 24–37)

## 2016-01-27 LAB — TROPONIN I: Troponin I: 0.03 ng/mL (ref <0.03)

## 2016-01-27 IMAGING — CR DG CHEST 1V PORT
1 series · 1 of 1 positions shown · non-contrast
Comparison: [DATE]

CLINICAL DATA: Abdominal pain

EXAM:
PORTABLE CHEST 1 VIEW

[ap portable]
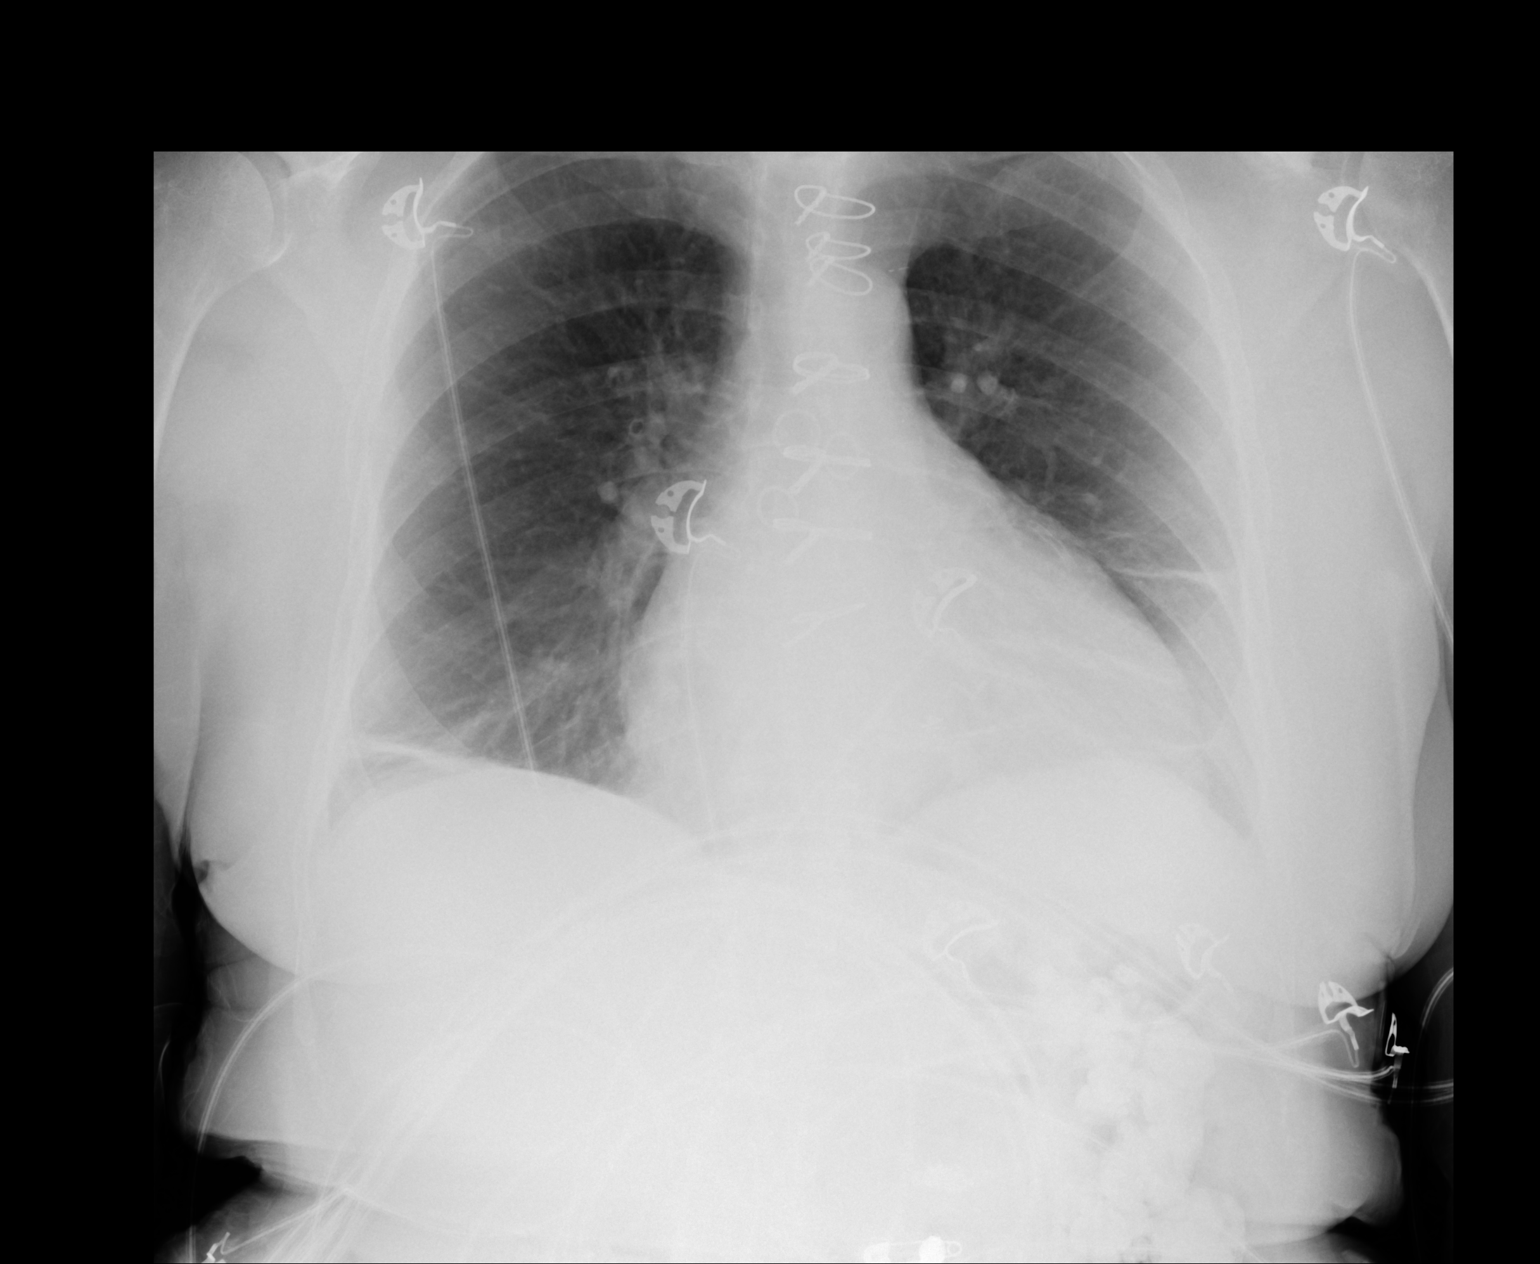

[1 of 1 positions shown; findings below may reference images not displayed]

FINDINGS: Is mild right basilar lingular atelectasis. There is no focal
parenchymal opacity. There is no pleural effusion or pneumothorax.
There is stable cardiomegaly. There is evidence of prior CABG.

The osseous structures are unremarkable.
IMPRESSION: No active disease.

## 2016-01-27 MED ORDER — NITROGLYCERIN 0.4 MG SL SUBL: 0.4000 mg | SUBLINGUAL_TABLET | SUBLINGUAL | Status: DC | PRN

## 2016-01-27 MED ORDER — CLONIDINE HCL 0.2 MG PO TABS
0.3000 mg | ORAL_TABLET | ORAL | Status: AC
  Administered 2016-01-27: 0.3 mg via ORAL
  Filled 2016-01-27: qty 1

## 2016-01-27 MED ORDER — HYDRALAZINE HCL 25 MG PO TABS
150.0000 mg | ORAL_TABLET | Freq: Two times a day (BID) | ORAL | Status: DC
  Administered 2016-01-27 – 2016-01-28 (×2): 150 mg via ORAL
  Filled 2016-01-27 (×2): qty 6

## 2016-01-27 MED ORDER — ASPIRIN EC 81 MG PO TBEC
81.0000 mg | DELAYED_RELEASE_TABLET | Freq: Every day | ORAL | Status: DC
  Administered 2016-01-28: 81 mg via ORAL
  Filled 2016-01-27: qty 1

## 2016-01-27 MED ORDER — CLONIDINE HCL 0.2 MG PO TABS
0.3000 mg | ORAL_TABLET | Freq: Two times a day (BID) | ORAL | Status: DC
  Administered 2016-01-28: 0.3 mg via ORAL
  Filled 2016-01-27: qty 1

## 2016-01-27 MED ORDER — PANTOPRAZOLE SODIUM 40 MG PO TBEC
40.0000 mg | DELAYED_RELEASE_TABLET | Freq: Every day | ORAL | Status: DC
  Administered 2016-01-28: 40 mg via ORAL
  Filled 2016-01-27 (×3): qty 1

## 2016-01-27 MED ORDER — SIMVASTATIN 20 MG PO TABS
20.0000 mg | ORAL_TABLET | Freq: Every day | ORAL | Status: DC
  Administered 2016-01-27: 20 mg via ORAL
  Filled 2016-01-27: qty 1

## 2016-01-27 MED ORDER — NITROGLYCERIN IN D5W 200-5 MCG/ML-% IV SOLN
5.0000 ug/min | Freq: Once | INTRAVENOUS | Status: AC
  Administered 2016-01-27: 20 ug/min via INTRAVENOUS
  Filled 2016-01-27: qty 250

## 2016-01-27 MED ORDER — LABETALOL HCL 5 MG/ML IV SOLN: 20.0000 mg | INTRAVENOUS | Status: DC | PRN

## 2016-01-27 MED ORDER — HYDROCODONE-ACETAMINOPHEN 5-325 MG PO TABS: 1.0000 | ORAL_TABLET | ORAL | Status: DC | PRN

## 2016-01-27 MED ORDER — ALBUTEROL SULFATE (2.5 MG/3ML) 0.083% IN NEBU: 3.0000 mL | INHALATION_SOLUTION | Freq: Four times a day (QID) | RESPIRATORY_TRACT | Status: DC | PRN

## 2016-01-27 MED ORDER — ASPIRIN 81 MG PO CHEW
324.0000 mg | CHEWABLE_TABLET | Freq: Once | ORAL | Status: AC
  Administered 2016-01-27: 324 mg via ORAL
  Filled 2016-01-27: qty 4

## 2016-01-27 MED ORDER — ALPRAZOLAM 0.25 MG PO TABS
0.2500 mg | ORAL_TABLET | Freq: Every evening | ORAL | Status: DC | PRN
  Administered 2016-01-28: 0.25 mg via ORAL
  Filled 2016-01-27: qty 1

## 2016-01-27 MED ORDER — ONDANSETRON HCL 4 MG/2ML IJ SOLN: 4.0000 mg | Freq: Four times a day (QID) | INTRAMUSCULAR | Status: DC | PRN

## 2016-01-27 MED ORDER — LABETALOL HCL 200 MG PO TABS
300.0000 mg | ORAL_TABLET | Freq: Two times a day (BID) | ORAL | Status: DC
  Administered 2016-01-28: 300 mg via ORAL
  Filled 2016-01-27: qty 2

## 2016-01-27 MED ORDER — ONDANSETRON HCL 4 MG PO TABS: 4.0000 mg | ORAL_TABLET | Freq: Four times a day (QID) | ORAL | Status: DC | PRN

## 2016-01-27 MED ORDER — ACETAMINOPHEN 650 MG RE SUPP: 650.0000 mg | Freq: Four times a day (QID) | RECTAL | Status: DC | PRN

## 2016-01-27 MED ORDER — ACETAMINOPHEN 325 MG PO TABS
650.0000 mg | ORAL_TABLET | Freq: Four times a day (QID) | ORAL | Status: DC | PRN
  Administered 2016-01-28: 650 mg via ORAL
  Filled 2016-01-27: qty 2

## 2016-01-27 MED ORDER — SODIUM CHLORIDE 0.9 % IV SOLN
250.0000 mL | INTRAVENOUS | Status: DC | PRN
  Administered 2016-01-28: 250 mL via INTRAVENOUS

## 2016-01-27 MED ORDER — SODIUM CHLORIDE 0.9% FLUSH: 3.0000 mL | INTRAVENOUS | Status: DC | PRN

## 2016-01-27 MED ORDER — BISACODYL 10 MG RE SUPP: 10.0000 mg | Freq: Every day | RECTAL | Status: DC | PRN

## 2016-01-27 MED ORDER — CINACALCET HCL 30 MG PO TABS
30.0000 mg | ORAL_TABLET | Freq: Every day | ORAL | Status: DC
  Administered 2016-01-28: 30 mg via ORAL
  Filled 2016-01-27: qty 1

## 2016-01-27 MED ORDER — LANTHANUM CARBONATE 500 MG PO CHEW
1000.0000 mg | CHEWABLE_TABLET | Freq: Three times a day (TID) | ORAL | Status: DC
  Administered 2016-01-28 (×3): 1000 mg via ORAL
  Filled 2016-01-27 (×5): qty 2

## 2016-01-27 MED ORDER — RENA-VITE PO TABS
1.0000 | ORAL_TABLET | Freq: Every day | ORAL | Status: DC
  Administered 2016-01-28: 1 via ORAL
  Filled 2016-01-27 (×2): qty 1

## 2016-01-27 MED ORDER — AMIODARONE HCL 200 MG PO TABS
200.0000 mg | ORAL_TABLET | Freq: Every day | ORAL | Status: DC
  Administered 2016-01-28: 200 mg via ORAL
  Filled 2016-01-27: qty 1

## 2016-01-27 MED ORDER — ISOSORBIDE DINITRATE 20 MG PO TABS
30.0000 mg | ORAL_TABLET | Freq: Three times a day (TID) | ORAL | Status: DC
  Administered 2016-01-28 (×2): 30 mg via ORAL
  Filled 2016-01-27 (×2): qty 1

## 2016-01-27 MED ORDER — SODIUM CHLORIDE 0.9% FLUSH
3.0000 mL | Freq: Two times a day (BID) | INTRAVENOUS | Status: DC
  Administered 2016-01-28: 3 mL via INTRAVENOUS

## 2016-01-27 MED ORDER — FLUTICASONE PROPIONATE 50 MCG/ACT NA SUSP
2.0000 | Freq: Every day | NASAL | Status: DC
  Administered 2016-01-28: 2 via NASAL
  Filled 2016-01-27: qty 16

## 2016-01-27 MED ORDER — ONDANSETRON HCL 4 MG/2ML IJ SOLN: 4.0000 mg | Freq: Three times a day (TID) | INTRAMUSCULAR | Status: DC | PRN

## 2016-01-27 MED ORDER — NITROGLYCERIN IN D5W 200-5 MCG/ML-% IV SOLN: 5.0000 ug/min | Freq: Once | INTRAVENOUS | Status: DC

## 2016-01-27 NOTE — Telephone Encounter (Signed)
Pt walked into office c/o high blood pressure after dialysis and headache. Pt denies dizziness/SOB has some pain in upper center of abd - pt took 1 NTG - and says it helped but still having some pain. BP 210/98, 136lbs wt today - HR 70 - 02 99% - pt says she has taken 3 hydralazine tabs about an hour ago - usually doesn't take this on dialysis days. Pt says she forgot to take labetalol today

## 2016-01-27 NOTE — ED Notes (Signed)
Patient states she was at dialysis and "my blood pressure went up instead of down." Patient complaining of upper middle abdominal pain radiating into her back at triage.

## 2016-01-27 NOTE — Telephone Encounter (Signed)
EKG done - will consult with DOD

## 2016-01-27 NOTE — H&P (Signed)
[facility] History and Physical  Shawna [name] [id number] DOB: Sep 23, 1940 DOA: 01/27/2016  Referring physician: Dr Sabra Heck, B. PCP: [name], MD   Chief Complaint: High BP  HPI: Shawna [name] is a 75 y.o. female with hx of DJD, gout, CAD sp CABG '13,  HL, HTN, ovarian Ca/ colon Ca (treated) and ESRD on HD who presented to ED sent from HD for uncontrolled HTN, with some epigastric vs chest pain and back pain.  In ED bp's 221/75 on presentation, started on IV NTG and down to 183/ 80.  Trop/ EKG no acute changes.  Asked to see for admit for CP and uncontrolled HTN.    Patient denies any recent CP, angina, NTG use.  No heart problems since CABG in 2013.  No anterior CP.  Had some epigastric pain and this is gone now, had mid scapular back pain and this is gone now too.  No n/v/d, no abd pain. Doesn't void much.    Patient grew up in [place].  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  Retired 7-8 yrs ago.  Lives with her husband.  No tob / etoh.       ROS  denies CP  no joint pain   no HA  no blurry vision  no rash  no diarrhea  no nausea/ vomiting  no dysuria  no difficulty voiding  no change in urine color    Past Medical History  Past Medical History  Diagnosis Date  . Chronic bronchitis ([place])   . GERD (gastroesophageal reflux disease)   . PUD (peptic ulcer disease)   . History of lower GI bleeding   . Arthritis   . History of gout   . CAD (coronary artery disease)     a. 12/2011 NSTEMI/Cath/PCI LCX (2.25x14 Resolute DES) & D1 (2.25x22 Resolute DES);  b. 01/2012 Cath/PCI: LM 30, LAD 30p, 40-28m, D1 stent ok, 99 in sm branch of diag, LCX patent stent, OM1 20, RCA 95 ost (4.0x12 Promus DES), EF 55%;  c. 04/2012 Lexi Cardiolite  EF 48%, small area of scar @ base/mid inflat wall with mild peri-infarct ischemia.; CABG 12/4  . High cholesterol 12/2011  . Pneumonia ~ 2009  . Iron deficiency anemia    . TIA (transient ischemic attack)   . Anxiety   . History of blood transfusion 07/2011; 12/2011; 01/2012 X 2; 04/2012  . Carotid artery disease ([place])     a. [id number] LICA, [id number]   . Mitral regurgitation     a. Moderate by echo, 02/2012  . Myocardial infarction ([place])   . Chronic diastolic CHF (congestive heart failure) ([place])     a. 02/2012 Echo EF 60-65%, nl wall motion, Gr 1 DD, mod MR  . Hypertension   . AVM (arteriovenous malformation) of colon   . Esophageal stricture   . Ovarian cancer ([place]) 1992  . Colon cancer ([employer]) 1992  . ESRD on hemodialysis ([place])     ESRD due to HTN, started dialysis 2011 and gets HD at [facility] with Dr Hinda Lenis on MWF schedule.  Access is LUA AVF as of Sept 2014.    Past Surgical History  Past Surgical History  Procedure Laterality Date  . Abdominal hysterectomy  1992  . Appendectomy  06/1990  . Tubal ligation  1980's  . Av fistula placement  07/2009    left upper arm  . Thrombectomy / arteriovenous graft revision  2011  left upper arm  . Colon resection  1992  . Esophagogastroduodenoscopy  01/20/2012    Procedure: ESOPHAGOGASTRODUODENOSCOPY (EGD);  Surgeon: Ladene Artist, MD,FACG;  Location: [facility] ENDOSCOPY;  Service: Endoscopy;  Laterality: N/A;  . Dilation and curettage of uterus    . Coronary angioplasty with stent placement  12/15/11    "2"  . Coronary angioplasty with stent placement  y/2013    "1; makes total of 3" (05/02/2012)  . Coronary artery bypass graft  06/13/2012    Procedure: CORONARY ARTERY BYPASS GRAFTING (CABG);  Surgeon: Grace Isaac, MD;  Location: [place];  Service: Open Heart Surgery;  Laterality: N/A;  cabg x four;  using left internal mammary artery, and left leg greater saphenous vein harvested endoscopically  . Intraoperative transesophageal echocardiogram  06/13/2012    Procedure: INTRAOPERATIVE TRANSESOPHAGEAL ECHOCARDIOGRAM;  Surgeon: Grace Isaac, MD;  Location: [place];  Service: Open Heart Surgery;  Laterality: N/A;   . Esophagogastroduodenoscopy N/A 03/26/2013    Procedure: ESOPHAGOGASTRODUODENOSCOPY (EGD);  Surgeon: Irene Shipper, MD;  Location: [facility] ENDOSCOPY;  Service: Endoscopy;  Laterality: N/A;  . Ovary surgery      ovarian cancer  . Left heart catheterization with coronary angiogram N/A 12/15/2011    Procedure: LEFT HEART CATHETERIZATION WITH CORONARY ANGIOGRAM;  Surgeon: Burnell Blanks, MD;  Location: [facility] CATH LAB;  Service: Cardiovascular;  Laterality: N/A;  . Left heart catheterization with coronary angiogram N/A 01/10/2012    Procedure: LEFT HEART CATHETERIZATION WITH CORONARY ANGIOGRAM;  Surgeon: Peter M Martinique, MD;  Location: [facility] CATH LAB;  Service: Cardiovascular;  Laterality: N/A;  . Left heart catheterization with coronary angiogram N/A 06/08/2012    Procedure: LEFT HEART CATHETERIZATION WITH CORONARY ANGIOGRAM;  Surgeon: Burnell Blanks, MD;  Location: [facility] CATH LAB;  Service: Cardiovascular;  Laterality: N/A;  . Shuntogram N/A 10/15/2013    Procedure: Fistulogram;  Surgeon: Serafina Mitchell, MD;  Location: [facility] CATH LAB;  Service: Cardiovascular;  Laterality: N/A;  . Left heart catheterization with coronary/graft angiogram N/A 12/10/2013    Procedure: LEFT HEART CATHETERIZATION WITH Beatrix Fetters;  Surgeon: [name], MD;  Location: [facility] CATH LAB;  Service: Cardiovascular;  Laterality: N/A;  . Esophagogastroduodenoscopy N/A 04/30/2015    Procedure: ESOPHAGOGASTRODUODENOSCOPY (EGD);  Surgeon: Rogene Houston, MD;  Location[facility] ENDO SU[facility];  Service: Endoscopy;  Laterality: N/A;  1pm - moved to 10/20 @ 1:10   Family History  Family History  Problem Relation Age of Onset  . Other      noncontributory for early CAD  . Heart disease Mother     Heart Disease before age 42  . Hyperlipidemia Mother   . Hypertension Mother   . Diabetes Mother   . Heart attack Mother   . Heart disease Father     Heart Disease before age 29  . Hyperlipidemia Father   . Hypertension Father    . Diabetes Father   . Diabetes Sister   . Hypertension Sister   . Diabetes Brother   . Hyperlipidemia Brother   . Heart attack Brother   . Colon cancer Neg Hx   . Esophageal cancer Neg Hx   . Liver disease Neg Hx   . Kidney disease Neg Hx   . Colon polyps Neg Hx   . Hypertension Sister   . Heart attack Brother    Social History  reports that she has never smoked. She has never used smokeless tobacco. She reports that she does not drink alcohol or use  illicit drugs. Allergies  Allergies  Allergen Reactions  . Aspirin Other (See Comments)    Mess up her stomach; "makes my bowels have blood in them". Takes 81 mg EC Aspirin   . Contrast Media [Iodinated Diagnostic Agents] Itching  . Iron Itching and Other (See Comments)    "they gave me iron in dialysis; had to give me Benadryl cause I had to have the iron" (05/02/2012)  . Penicillins Other (See Comments)    "makes me real weak when I take it; like I'll pass out"Has patient had a PCN reaction causing immediate rash, facial/tongue/throat swelling, SOB or lightheadedness with hypotension: Nono Has patient had a PCN reaction causing severe rash involving mucus membranes or skin necrosis: Nono Has patient had a PCN reaction that required hospitalization Nono Has patient had a PCN reaction occurring within the last 10 years: Nono If all of the above  . Plavix [Clopidogrel Bisulfate] Rash  . Amlodipine Swelling    Leg swelling  . Bactrim [Sulfamethoxazole-Trimethoprim] Rash  . Dexilant [Dexlansoprazole] Other (See Comments)    Upset stomach  . Morphine And Related Itching    Itching in feet  . Nitrofurantoin Hives  . Prilosec [Omeprazole] Other (See Comments)    "back spasms"  . Venofer [Ferric Oxide] Itching    Patient reports using Benadryl prior to doses as [facility]  . Levaquin [Levofloxacin In D5w] Rash  . Protonix [Pantoprazole Sodium] Rash   Home medications Prior to Admission medications   Medication Sig Start  Date End Date Taking? Authorizing Provider  ALPRAZolam Duanne Moron) 0.25 MG tablet Take 1-2 tablets by mouth at bedtime as needed for sleep.  11/19/15  Yes Historical Provider, MD  amiodarone (PACERONE) 200 MG tablet Take 1 tablet (200 mg total) by mouth daily. 09/23/15  Yes Herminio Commons, MD  amLODipine (NORVASC) 2.5 MG tablet Take 1 tablet (2.5 mg total) by mouth daily. 07/28/15  Yes Herminio Commons, MD  aspirin EC 81 MG tablet Take 81 mg by mouth daily.    Yes Historical Provider, MD  bacitracin ophthalmic ointment  12/08/15  Yes Historical Provider, MD  cloNIDine (CATAPRES) 0.3 MG tablet Take 1 tablet (0.3 mg total) by mouth 2 (two) times daily. Patient taking differently: Take 0.3 mg by mouth daily. *May take an additional tablet based on blood pressure levels* 06/10/15  Yes Herminio Commons, MD  fluticasone (FLONASE) 50 MCG/ACT nasal spray Place 2 sprays into the nose daily.    Yes Historical Provider, MD  folic acid-vitamin b complex-vitamin c-selenium-zinc (DIALYVITE) 3 MG TABS Take 1 tablet by mouth daily.   Yes Historical Provider, MD  hydrALAZINE (APRESOLINE) 50 MG tablet Take 200 mg (4 pills) in the am, 100 mg ( 2 pills) at 2 pm, and 100 mg ( 2 pills) at 10 pm Patient taking differently: Take 150 mg by mouth 2 (two) times daily. Take 200 mg (4 pills) in the am, 100 mg ( 2 pills) at 2 pm, and 100 mg ( 2 pills) at 10 pm 09/29/15  Yes Lendon Colonel, NP  isosorbide dinitrate (ISORDIL) 30 MG tablet Take 1 tablet (30 mg total) by mouth 3 (three) times daily. 07/28/15  Yes Herminio Commons, MD  labetalol (NORMODYNE) 200 MG tablet Take 1.5 tablets (300 mg total) by mouth 2 (two) times daily. 06/17/15  Yes Herminio Commons, MD  lanthanum (FOSRENOL) 1000 MG chewable tablet Chew 1,000 mg by mouth 3 (three) times daily after meals.   Yes Historical Provider,  MD  omeprazole (PRILOSEC) 20 MG capsule Take 1 capsule (20 mg total) by mouth daily. 05/12/15  Yes Butch Penny, NP  ondansetron  (ZOFRAN-ODT) 4 MG disintegrating tablet Take 4 mg by mouth every 8 (eight) hours as needed for nausea or vomiting. nausea 05/18/15  Yes Historical Provider, MD  SENSIPAR 30 MG tablet Take 30 mg by mouth daily with supper.  12/21/12  Yes Historical Provider, MD  simvastatin (ZOCOR) 20 MG tablet TAKE ONE TABLET BY MOUTH AT BEDTIME 07/22/15  Yes Herminio Commons, MD  acetaminophen (TYLENOL) 325 MG tablet Take 2 tablets (650 mg total) by mouth every 6 (six) hours as needed for mild pain (mild pain). Patient not taking: Reported on 01/27/2016 10/02/14   Erline Hau, MD  ALPRAZolam Duanne Moron) 0.5 MG tablet Take 1 tablet (0.5 mg total) by mouth 3 (three) times daily. Patient not taking: Reported on 01/27/2016 01/27/15   Theodis Blaze, MD  benzonatate (TESSALON) 100 MG capsule Take 1 capsule (100 mg total) by mouth every 8 (eight) hours. Patient not taking: Reported on 01/27/2016 09/03/15   Erline Hau, MD  NITROSTAT 0.4 MG SL tablet DISSOLVE ONE TABLET UNDER THE TONGUE EVERY 5 MINUTES AS NEEDED FOR CHEST PAIN.  DO NOT EXCEED A TOTAL OF 3 DOSES IN 15 MINUTES AS NEEDED 01/20/16   Herminio Commons, MD  PROAIR HFA 108 (90 BASE) MCG/ACT inhaler Inhale 1 puff into the lungs every 6 (six) hours as needed for wheezing or shortness of breath.  01/09/15   Historical Provider, MD   Liver Function Tests  Recent Labs Lab 01/27/16 1630  AST 22  ALT 16  ALKPHOS 72  BILITOT 0.5  PROT 6.4*  ALBUMIN 3.8   No results for input(s): LIPASE, AMYLASE in the last 168 hours. CBC  Recent Labs Lab 01/27/16 1540  WBC 6.9  HGB 11.3*  HCT 35.4*  MCV 101.4*  PLT 99991111   Basic Metabolic Panel  Recent Labs Lab 01/27/16 1630  NA 134*  K 3.8  CL 96*  CO2 28  GLUCOSE 82  BUN 18  CREATININE 5.82*  CALCIUM 8.7*     Filed Vitals:   01/27/16 2000 01/27/16 2031 01/27/16 2045 01/27/16 2100  BP: 183/80  185/91 163/85  Pulse: 60  65 65  Temp:  98.5 F (36.9 C)    TempSrc:  Oral    Resp: 13   26 16   Height:  5\' 1"  (1.549 m)    Weight:  61.5 kg (135 lb 9.3 oz)    SpO2: 96%  96% 98%   Exam: Gen alert, calm, no distress No rash, cyanosis or gangrene Sclera anicteric, throat clear  No jvd or bruits Chest clear bilat RRR no MRG, sternotomy scar well healed Abd soft ntnd no mass or ascites +bs GU deferred MS no joint effusions or deformity Ext no LE or UE edema / no wounds or ulcers Neuro is alert, Ox 3 , nf LUA AVF +bruit  Na 134  K 3.8  Cr 8.7  Alb 3.8  WBC 6k Hb 11    EKG (independ reviewed) > NSR 64bpm, LVH w repol changes, no acute  CXR (independ reviewed) > clear, on acute   Assessment: 1.  HTN'sive urgency - no evid ischemia.  Could be losing body weight.  Hasn't run out of BP meds and seems compliant w HD and meds.  BP down now on IV NTG, after giving some po BP meds too.  Will  dc IV NTG and admit OBS status, resume home meds.  Call renal for consult in am if BP's not any better. Prn IV labetalol.  Not taking norvasc 2.5 due to leg edema 2.  ESRD MWF HD , [place] 3.  Hx colon /ovarian cancer 4.  Hx TIA 5.  CAD sp CABG 6.  Chest pain - neg trop/ EKG, get another troponin, monitor   Plan - as above     Sol Blazing [facility] Pager [phone]  Cell [phone]  If 7PM-7AM, please contact night-coverage [email or web address] Password [facility] 01/27/2016, 9:39 PM

## 2016-01-27 NOTE — ED Notes (Signed)
CRITICAL VALUE ALERT  Critical value received:  Troponin = 0.03  Date of notification:  01/27/16  Time of notification:  [id number]  Critical value read back:Yes.    Nurse who received alert:  Rosealee Albee, RN  MD notified (1st page):  Dr. Sabra Heck  Time of first page:  1726  MD notified (2nd page):  Time of second page:  Responding MD:    Time MD responded:

## 2016-01-27 NOTE — ED Provider Notes (Signed)
CSN: [id number]     Arrival date & time 01/27/16  1442 History   First MD Initiated Contact with Patient 01/27/16 1455     Chief Complaint  Patient presents with  . Abdominal Pain  . Hypertension     (Consider location/radiation/quality/duration/timing/severity/associated sxs/prior Treatment) HPI  Patient is a 75 year old female with a complicated medical history including end-stage renal disease who has been on dialysis for 6 years, Monday Wednesday Friday as well as a history of hypertension, myocardial infarction, chronic diastolic heart failure. She has undergone surgery twice for stenting of her coronary arteries and most recently in 2013 had open heart bypass surgery. She reports that yesterday she was having difficulty with sweating unusually and pain behind her left shoulder blade, this went away spontaneously but today while she was at dialysis after 3 hours of treatment her blood pressure spiked into the 220 range during which time she was developing epigastric pain, back pain. She took 3 doses of hydralazine prior to leaving dialysis on the way by ambulance to the hospital. She only completed 3 out of 4 hours. At this time she states she still has some mild epigastric discomfort and back discomfort, no nausea, no fever, no shortness of breath at this time, no swelling of the legs. She has otherwise been in good health until the last 2 days. She has had no aspirin today  Past Medical History  Diagnosis Date  . Chronic bronchitis ([place])   . GERD (gastroesophageal reflux disease)   . PUD (peptic ulcer disease)   . History of lower GI bleeding   . Arthritis   . History of gout   . CAD (coronary artery disease)     a. 12/2011 NSTEMI/Cath/PCI LCX (2.25x14 Resolute DES) & D1 (2.25x22 Resolute DES);  b. 01/2012 Cath/PCI: LM 30, LAD 30p, 40-48m, D1 stent ok, 99 in sm branch of diag, LCX patent stent, OM1 20, RCA 95 ost (4.0x12 Promus DES), EF 55%;  c. 04/2012 Lexi Cardiolite  EF 48%, small  area of scar @ base/mid inflat wall with mild peri-infarct ischemia.; CABG 12/4  . High cholesterol 12/2011  . Pneumonia ~ 2009  . Iron deficiency anemia   . TIA (transient ischemic attack)   . Anxiety   . History of blood transfusion 07/2011; 12/2011; 01/2012 X 2; 04/2012  . Carotid artery disease ([place])     a. [id number] LICA, [id number]   . Mitral regurgitation     a. Moderate by echo, 02/2012  . Myocardial infarction ([place])   . Chronic diastolic CHF (congestive heart failure) ([place])     a. 02/2012 Echo EF 60-65%, nl wall motion, Gr 1 DD, mod MR  . Hypertension   . AVM (arteriovenous malformation) of colon   . Esophageal stricture   . Ovarian cancer ([place]) 1992  . Colon cancer ([place]) 1992  . ESRD on hemodialysis ([place])     ESRD due to HTN, started dialysis 2011 and gets HD at [facility] with Dr Hinda Lenis on MWF schedule.  Access is LUA AVF as of Sept 2014.    Past Surgical History  Procedure Laterality Date  . Abdominal hysterectomy  1992  . Appendectomy  06/1990  . Tubal ligation  1980's  . Av fistula placement  07/2009    left upper arm  . Thrombectomy / arteriovenous graft revision  2011    left upper arm  . Colon resection  1992  . Esophagogastroduodenoscopy  01/20/2012    Procedure: ESOPHAGOGASTRODUODENOSCOPY (EGD);  Surgeon: Pricilla Riffle  Fuller Plan, MD,FACG;  Location: [facility] ENDOSCOPY;  Service: Endoscopy;  Laterality: N/A;  . Dilation and curettage of uterus    . Coronary angioplasty with stent placement  12/15/11    "2"  . Coronary angioplasty with stent placement  y/2013    "1; makes total of 3" (05/02/2012)  . Coronary artery bypass graft  06/13/2012    Procedure: CORONARY ARTERY BYPASS GRAFTING (CABG);  Surgeon: Grace Isaac, MD;  Location: [place];  Service: Open Heart Surgery;  Laterality: N/A;  cabg x four;  using left internal mammary artery, and left leg greater saphenous vein harvested endoscopically  . Intraoperative transesophageal echocardiogram  06/13/2012    Procedure: INTRAOPERATIVE  TRANSESOPHAGEAL ECHOCARDIOGRAM;  Surgeon: Grace Isaac, MD;  Location: [place];  Service: Open Heart Surgery;  Laterality: N/A;  . Esophagogastroduodenoscopy N/A 03/26/2013    Procedure: ESOPHAGOGASTRODUODENOSCOPY (EGD);  Surgeon: Irene Shipper, MD;  Location: [facility] ENDOSCOPY;  Service: Endoscopy;  Laterality: N/A;  . Ovary surgery      ovarian cancer  . Left heart catheterization with coronary angiogram N/A 12/15/2011    Procedure: LEFT HEART CATHETERIZATION WITH CORONARY ANGIOGRAM;  Surgeon: Burnell Blanks, MD;  Location: [facility] CATH LAB;  Service: Cardiovascular;  Laterality: N/A;  . Left heart catheterization with coronary angiogram N/A 01/10/2012    Procedure: LEFT HEART CATHETERIZATION WITH CORONARY ANGIOGRAM;  Surgeon: Peter M Martinique, MD;  Location: [facility] CATH LAB;  Service: Cardiovascular;  Laterality: N/A;  . Left heart catheterization with coronary angiogram N/A 06/08/2012    Procedure: LEFT HEART CATHETERIZATION WITH CORONARY ANGIOGRAM;  Surgeon: Burnell Blanks, MD;  Location: [facility] CATH LAB;  Service: Cardiovascular;  Laterality: N/A;  . Shuntogram N/A 10/15/2013    Procedure: Fistulogram;  Surgeon: Serafina Mitchell, MD;  Location: [facility] CATH LAB;  Service: Cardiovascular;  Laterality: N/A;  . Left heart catheterization with coronary/graft angiogram N/A 12/10/2013    Procedure: LEFT HEART CATHETERIZATION WITH Beatrix Fetters;  Surgeon: [name], MD;  Location: [facility] CATH LAB;  Service: Cardiovascular;  Laterality: N/A;  . Esophagogastroduodenoscopy N/A 04/30/2015    Procedure: ESOPHAGOGASTRODUODENOSCOPY (EGD);  Surgeon: Rogene Houston, MD;  Location: [facility] ENDO SUITE;  Service: Endoscopy;  Laterality: N/A;  1pm - moved to 10/20 @ 1:10   Family History  Problem Relation Age of Onset  . Other      noncontributory for early CAD  . Heart disease Mother     Heart Disease before age 26  . Hyperlipidemia Mother   . Hypertension Mother   . Diabetes Mother   . Heart attack Mother    . Heart disease Father     Heart Disease before age 58  . Hyperlipidemia Father   . Hypertension Father   . Diabetes Father   . Diabetes Sister   . Hypertension Sister   . Diabetes Brother   . Hyperlipidemia Brother   . Heart attack Brother   . Colon cancer Neg Hx   . Esophageal cancer Neg Hx   . Liver disease Neg Hx   . Kidney disease Neg Hx   . Colon polyps Neg Hx   . Hypertension Sister   . Heart attack Brother    Social History  Substance Use Topics  . Smoking status: Never Smoker   . Smokeless tobacco: Never Used  . Alcohol Use: No   OB History    Gravida Para Term Preterm AB TAB SAB Ectopic Multiple Living   2 2  2  2     Review of Systems  All other systems reviewed and are negative.     Allergies  Aspirin; Contrast media; Iron; Penicillins; Plavix; Amlodipine; Bactrim; Dexilant; Morphine and related; Nitrofurantoin; Prilosec; Venofer; Levaquin; and Protonix  Home Medications   Prior to Admission medications   Medication Sig Start Date End Date Taking? Authorizing Provider  ALPRAZolam Duanne Moron) 0.25 MG tablet Take 1-2 tablets by mouth at bedtime as needed for sleep.  11/19/15  Yes Historical Provider, MD  amiodarone (PACERONE) 200 MG tablet Take 1 tablet (200 mg total) by mouth daily. 09/23/15  Yes Herminio Commons, MD  amLODipine (NORVASC) 2.5 MG tablet Take 1 tablet (2.5 mg total) by mouth daily. 07/28/15  Yes Herminio Commons, MD  aspirin EC 81 MG tablet Take 81 mg by mouth daily.    Yes Historical Provider, MD  bacitracin ophthalmic ointment  12/08/15  Yes Historical Provider, MD  cloNIDine (CATAPRES) 0.3 MG tablet Take 1 tablet (0.3 mg total) by mouth 2 (two) times daily. Patient taking differently: Take 0.3 mg by mouth daily. *May take an additional tablet based on blood pressure levels* 06/10/15  Yes Herminio Commons, MD  fluticasone (FLONASE) 50 MCG/ACT nasal spray Place 2 sprays into the nose daily.    Yes Historical Provider, MD  folic  acid-vitamin b complex-vitamin c-selenium-zinc (DIALYVITE) 3 MG TABS Take 1 tablet by mouth daily.   Yes Historical Provider, MD  hydrALAZINE (APRESOLINE) 50 MG tablet Take 200 mg (4 pills) in the am, 100 mg ( 2 pills) at 2 pm, and 100 mg ( 2 pills) at 10 pm Patient taking differently: Take 150 mg by mouth 2 (two) times daily. Take 200 mg (4 pills) in the am, 100 mg ( 2 pills) at 2 pm, and 100 mg ( 2 pills) at 10 pm 09/29/15  Yes Lendon Colonel, NP  isosorbide dinitrate (ISORDIL) 30 MG tablet Take 1 tablet (30 mg total) by mouth 3 (three) times daily. 07/28/15  Yes Herminio Commons, MD  labetalol (NORMODYNE) 200 MG tablet Take 1.5 tablets (300 mg total) by mouth 2 (two) times daily. 06/17/15  Yes Herminio Commons, MD  lanthanum (FOSRENOL) 1000 MG chewable tablet Chew 1,000 mg by mouth 3 (three) times daily after meals.   Yes Historical Provider, MD  omeprazole (PRILOSEC) 20 MG capsule Take 1 capsule (20 mg total) by mouth daily. 05/12/15  Yes Butch Penny, NP  ondansetron (ZOFRAN-ODT) 4 MG disintegrating tablet Take 4 mg by mouth every 8 (eight) hours as needed for nausea or vomiting. nausea 05/18/15  Yes Historical Provider, MD  SENSIPAR 30 MG tablet Take 30 mg by mouth daily with supper.  12/21/12  Yes Historical Provider, MD  simvastatin (ZOCOR) 20 MG tablet TAKE ONE TABLET BY MOUTH AT BEDTIME 07/22/15  Yes Herminio Commons, MD  acetaminophen (TYLENOL) 325 MG tablet Take 2 tablets (650 mg total) by mouth every 6 (six) hours as needed for mild pain (mild pain). Patient not taking: Reported on 01/27/2016 10/02/14   Erline Hau, MD  ALPRAZolam Duanne Moron) 0.5 MG tablet Take 1 tablet (0.5 mg total) by mouth 3 (three) times daily. Patient not taking: Reported on 01/27/2016 01/27/15   Theodis Blaze, MD  benzonatate (TESSALON) 100 MG capsule Take 1 capsule (100 mg total) by mouth every 8 (eight) hours. Patient not taking: Reported on 01/27/2016 09/03/15   Erline Hau, MD   NITROSTAT 0.4 MG SL tablet DISSOLVE ONE TABLET UNDER  THE TONGUE EVERY 5 MINUTES AS NEEDED FOR CHEST PAIN.  DO NOT EXCEED A TOTAL OF 3 DOSES IN 15 MINUTES AS NEEDED 01/20/16   Herminio Commons, MD  PROAIR HFA 108 (90 BASE) MCG/ACT inhaler Inhale 1 puff into the lungs every 6 (six) hours as needed for wheezing or shortness of breath.  01/09/15   Historical Provider, MD   BP 204/84 mmHg  Pulse 63  Temp(Src) 98.1 F (36.7 C) (Oral)  Resp 12  Ht 5\' 1"  (1.549 m)  Wt 139 lb (63.05 kg)  BMI 26.28 kg/m2  SpO2 97% Physical Exam  Constitutional: She appears well-developed and well-nourished. No distress.  HENT:  Head: Normocephalic and atraumatic.  Mouth/Throat: Oropharynx is clear and moist. No oropharyngeal exudate.  Eyes: Conjunctivae and EOM are normal. Pupils are equal, round, and reactive to light. Right eye exhibits no discharge. Left eye exhibits no discharge. No scleral icterus.  Neck: Normal range of motion. Neck supple. No JVD present. No thyromegaly present.  Cardiovascular: Normal rate, regular rhythm, normal heart sounds and intact distal pulses.  Exam reveals no gallop and no friction rub.   No murmur heard. Pulmonary/Chest: Effort normal and breath sounds normal. No respiratory distress. She has no wheezes. She has no rales.  Abdominal: Soft. Bowel sounds are normal. She exhibits no distension and no mass. There is tenderness ( Minimal epigastric and suprapubic discomfort, no guarding masses or peritoneal signs).  Musculoskeletal: Normal range of motion. She exhibits no edema or tenderness.  Lymphadenopathy:    She has no cervical adenopathy.  Neurological: She is alert. Coordination normal.  Skin: Skin is warm and dry. No rash noted. No erythema.  Psychiatric: She has a normal mood and affect. Her behavior is normal.  Nursing note and vitals reviewed.   ED Course  Procedures (including critical care time) Labs Review Labs Reviewed  CBC - Abnormal; Notable for the  following:    RBC 3.49 (*)    Hemoglobin 11.3 (*)    HCT 35.4 (*)    MCV 101.4 (*)    RDW 17.5 (*)    All other components within normal limits  COMPREHENSIVE METABOLIC PANEL - Abnormal; Notable for the following:    Sodium 134 (*)    Chloride 96 (*)    Creatinine, Ser 5.82 (*)    Calcium 8.7 (*)    Total Protein 6.4 (*)    GFR calc non Af Amer 6 (*)    GFR calc Af Amer 7 (*)    All other components within normal limits  TROPONIN I - Abnormal; Notable for the following:    Troponin I 0.03 (*)    All other components within normal limits  APTT  PROTIME-INR    Imaging Review Dg Chest Portable 1 View  01/27/2016  CLINICAL DATA:  Abdominal pain EXAM: PORTABLE CHEST 1 VIEW COMPARISON:  09/08/2015 FINDINGS: Is mild right basilar lingular atelectasis. There is no focal parenchymal opacity. There is no pleural effusion or pneumothorax. There is stable cardiomegaly. There is evidence of prior CABG. The osseous structures are unremarkable. IMPRESSION: No active disease. Electronically Signed   By: Kathreen Devoid   On: 01/27/2016 15:37   I have personally reviewed and evaluated these images and lab results as part of my medical decision-making.   EKG Interpretation   Date/Time:  Wednesday January 27 2016 14:51:05 EDT Ventricular Rate:  69 PR Interval:  170 QRS Duration: 100 QT Interval:  468 QTC Calculation: 501 R Axis:   39  Text Interpretation:  Normal sinus rhythm Possible Left atrial enlargement  Left ventricular hypertrophy with repolarization abnormality Prolonged QT  Abnormal ECG since last tracing no significant change Confirmed by Sabra Heck   MD, Shiza Thelen ([id number]) on 01/27/2016 3:06:34 PM   Repeat ECG     EKG Interpretation  Date/Time:  Wednesday January 27 2016 17:12:22 EDT Ventricular Rate:  65 PR Interval:  170 QRS Duration: 102 QT Interval:  496 QTC Calculation: 516 R Axis:   36 Text Interpretation:  Sinus rhythm LVH with secondary repolarization abnormality Anterior ST  elevation, probably due to LVH Prolonged QT interval since last tracing no significant change Confirmed by Thaily Hackworth  MD, Heavin Sebree ([id number]) on 01/27/2016 5:19:49 PM        MDM   Final diagnoses:  Hypertensive crisis    The patient's cardiac exam is unremarkable, her EKG is abnormal with ST and T wave abnormalities though these do not seem to be changed from prior EKGs. At this time the patient will likely need to be worked up for a source of this symptoms including heart, abdominal and pulmonary. Most likely this could be coronary and she does have significant history of the same. Her blood pressure on arrival was 221/75, this will be monitored, medication if this does not come down spontaneously. We'll start nitroglycerin  The patient continues to be severely hypertensive despite a nitroglycerin drip. Clonidine has been ordered, the patient did take 3 tablets of her hydralazine prior to coming to the hospital. I have discussed her care with the on-call hospitalist who will admit the patient at the hospital to the stepdown unit. I appreciate Dr. Wilburt Finlay and his assistance.  CRITICAL CARE Performed by: Johnna Acosta Total critical care time: 35 minutes Critical care time was exclusive of separately billable procedures and treating other patients. Critical care was necessary to treat or prevent imminent or life-threatening deterioration. Critical care was time spent personally by me on the following activities: development of treatment plan with patient and/or surrogate as well as nursing, discussions with consultants, evaluation of patient's response to treatment, examination of patient, obtaining history from patient or surrogate, ordering and performing treatments and interventions, ordering and review of laboratory studies, ordering and review of radiographic studies, pulse oximetry and re-evaluation of patient's condition.  Medications  aspirin chewable tablet 324 mg (324 mg Oral Given 01/27/16  1555)  nitroGLYCERIN 50 mg in dextrose 5 % 250 mL (0.2 mg/mL) infusion (40 mcg/min Intravenous Rate/Dose Change 01/27/16 1713)  cloNIDine (CATAPRES) tablet 0.3 mg (0.3 mg Oral Given 01/27/16 1822)     Noemi Chapel, MD 01/27/16 [phone]

## 2016-01-27 NOTE — Telephone Encounter (Signed)
Discussed with Dr. Harl Bowie who also spoke with pt - pt advised to report to [facility] ED for further evaluation

## 2016-01-28 DIAGNOSIS — Z992 Dependence on renal dialysis: Secondary | ICD-10-CM

## 2016-01-28 DIAGNOSIS — I160 Hypertensive urgency: Principal | ICD-10-CM

## 2016-01-28 DIAGNOSIS — N186 End stage renal disease: Secondary | ICD-10-CM

## 2016-01-28 LAB — CBC
HEMATOCRIT: 34.1 % — AB (ref 36.0–46.0)
Hemoglobin: 10.9 g/dL — ABNORMAL LOW (ref 12.0–15.0)
MCH: 32.6 pg (ref 26.0–34.0)
MCHC: 32 g/dL (ref 30.0–36.0)
MCV: 102.1 fL — ABNORMAL HIGH (ref 78.0–100.0)
PLATELETS: 238 10*3/uL (ref 150–400)
RBC: 3.34 MIL/uL — AB (ref 3.87–5.11)
RDW: 17.3 % — AB (ref 11.5–15.5)
WBC: 8.6 10*3/uL (ref 4.0–10.5)

## 2016-01-28 LAB — MRSA PCR SCREENING: MRSA by PCR: NEGATIVE

## 2016-01-28 MED ORDER — CLONIDINE HCL 0.1 MG PO TABS
0.1000 mg | ORAL_TABLET | Freq: Once | ORAL | Status: AC
  Administered 2016-01-28: 0.1 mg via ORAL
  Filled 2016-01-28: qty 1

## 2016-01-28 MED ORDER — HYDRALAZINE HCL 50 MG PO TABS: 150.0000 mg | ORAL_TABLET | Freq: Two times a day (BID) | ORAL | Status: DC

## 2016-01-28 MED ORDER — CLONIDINE HCL 0.3 MG PO TABS: 0.3000 mg | ORAL_TABLET | Freq: Every day | ORAL | Status: DC

## 2016-01-28 NOTE — Discharge Summary (Signed)
Physician Discharge Summary  Shawna [name] [id number] DOB: 02-06-1941 DOA: 01/27/2016  PCP: Shawna Percy, MD  Admit date: 01/27/2016 Discharge date: 01/28/2016  Time spent: Greater than 30 minutes  Recommendations for Outpatient Follow-up:  1. Recommend follow-up questioning about compliance with blood pressure medications.     Discharge Diagnoses:  1. Hypertensive urgency, possibly secondary to noncompliance. 2. End-stage renal disease on hemodialysis. 3. Marginal elevation of troponin I without evidence of ACS. 4. CAD with history of CABG. 5. Anemia of chronic kidney disease.  Discharge Condition: Improved.  Diet recommendation: Heart healthy.  Filed Weights   01/27/16 1447 01/27/16 2031 01/28/16 0500  Weight: 63.05 kg (139 lb) 61.5 kg (135 lb 9.3 oz) 61.5 kg (135 lb 9.3 oz)    History of present illness:  Patient is a 75 year old woman with a history of end-stage renal disease on HD, CAD, status post CABG in 2013, HTN, ovarian and colon cancer, who presented to the ED from hemodialysis with uncontrolled hypertension. Apparently, the patient's blood pressure was accelerated at greater than [id number] systolically at the hemodialysis center. She had no complaints of chest pain, shortness of breath, but she did have a slight headache. In the ED, her initial blood pressure was 221/75. She was afebrile and oxygenating well. EKG revealed sinus rhythm with nonspecific T and T-wave abnormality is and prolonged QT interval. Her chest x-ray revealed no active disease. She was admitted for further evaluation and management.  Hospital Course:  The patient was restarted on all of her chronic antihypertensive medications. She was apparently started on a nitroglycerin drip in the ED, but it was discontinued shortly following admission due to a significant decrease in her systolic blood pressure. Upon further evaluation and questioning of the patient, she had not been taking Norvasc due to lower  extremity edema. In addition to her chronic blood pressure medications, as needed IV labetalol and hydralazine were ordered. Over the course of the hospitalization, her blood pressure improved, but was not by any means within normal limits.  Upon further questioning of the patient, she acknowledged that she had been taking labetalol only once daily when she was supposed to be taking it twice daily. In addition, she was only taking isosorbide once or twice daily when it was prescribed 3 times a day. Patient was encouraged to be more compliant with her medication regimen. The lack of her taking the labetalol and isosorbide as prescribed, likely led to hypertensive urgency at hemodialysis and on admission. TSH was ordered for evaluation and was found to be within normal limits. Her blood pressure was 170/73 at the time of discharge. She was instructed to follow-up with Dr. Lowanda [name] and/or Dr. Jacinta [name] for long-term management of her hypertension.   Procedures:  None  Consultations:  None  Discharge Exam: Filed Vitals:   01/28/16 1730 01/28/16 1751  BP:    Pulse: 65   Temp:  97.9 F (36.6 C)  Resp: 19    blood pressure 170/73. Temperature 97.9. Oxygen saturation 96% on room air.  General: Pleasant 75 year old woman sitting up in the chair, in no acute distress. Cardiovascular: S1, S2, with a soft systolic murmur. Respiratory: Clear to auscultation bilaterally.  Discharge Instructions   Discharge Instructions    Diet - low sodium heart healthy    Complete by:  As directed      Discharge instructions    Complete by:  As directed   Take medications as prescribed.     Increase activity slowly    Complete  by:  As directed           Discharge Medication List as of 01/28/2016  6:16 PM    CONTINUE these medications which have CHANGED   Details  cloNIDine (CATAPRES) 0.3 MG tablet Take 1 tablet (0.3 mg total) by mouth daily. *May take an additional tablet based on blood pressure  levels*, Starting 01/28/2016, Until Discontinued, No Print    hydrALAZINE (APRESOLINE) 50 MG tablet Take 3 tablets (150 mg total) by mouth 2 (two) times daily., Starting 01/28/2016, Until Discontinued, No Print      CONTINUE these medications which have NOT CHANGED   Details  ALPRAZolam (XANAX) 0.25 MG tablet Take 1-2 tablets by mouth at bedtime as needed for sleep. , Starting 11/19/2015, Until Discontinued, Historical Med    amiodarone (PACERONE) 200 MG tablet Take 1 tablet (200 mg total) by mouth daily., Starting 09/23/2015, Until Discontinued, Normal    aspirin EC 81 MG tablet Take 81 mg by mouth daily. , Until Discontinued, Historical Med    bacitracin ophthalmic ointment Starting 12/08/2015, Until Discontinued, Historical Med    fluticasone (FLONASE) 50 MCG/ACT nasal spray Place 2 sprays into the nose daily. , Until Discontinued, Historical Med    folic acid-vitamin b complex-vitamin c-selenium-zinc (DIALYVITE) 3 MG TABS Take 1 tablet by mouth daily., Until Discontinued, Historical Med    isosorbide dinitrate (ISORDIL) 30 MG tablet Take 1 tablet (30 mg total) by mouth 3 (three) times daily., Starting 07/28/2015, Until Discontinued, Normal    labetalol (NORMODYNE) 200 MG tablet Take 1.5 tablets (300 mg total) by mouth 2 (two) times daily., Starting 06/17/2015, Until Discontinued, Normal    lanthanum (FOSRENOL) 1000 MG chewable tablet Chew 1,000 mg by mouth 3 (three) times daily after meals., Until Discontinued, Historical Med    omeprazole (PRILOSEC) 20 MG capsule Take 1 capsule (20 mg total) by mouth daily., Starting 05/12/2015, Until Discontinued, Normal    ondansetron (ZOFRAN-ODT) 4 MG disintegrating tablet Take 4 mg by mouth every 8 (eight) hours as needed for nausea or vomiting. nausea, Starting 05/18/2015, Until Discontinued, Historical Med    SENSIPAR 30 MG tablet Take 30 mg by mouth daily with supper. , Starting 12/21/2012, Until Discontinued, Historical Med    simvastatin (ZOCOR) 20  MG tablet TAKE ONE TABLET BY MOUTH AT BEDTIME, Normal    NITROSTAT 0.4 MG SL tablet DISSOLVE ONE TABLET UNDER THE TONGUE EVERY 5 MINUTES AS NEEDED FOR CHEST PAIN.  DO NOT EXCEED A TOTAL OF 3 DOSES IN 15 MINUTES AS NEEDED, Normal    PROAIR HFA 108 (90 BASE) MCG/ACT inhaler Inhale 1 puff into the lungs every 6 (six) hours as needed for wheezing or shortness of breath. , Starting 01/09/2015, Until Discontinued, Historical Med      STOP taking these medications     acetaminophen (TYLENOL) 325 MG tablet      benzonatate (TESSALON) 100 MG capsule        Allergies  Allergen Reactions  . Aspirin Other (See Comments)    Mess up her stomach; "makes my bowels have blood in them". Takes 81 mg EC Aspirin   . Contrast Media [Iodinated Diagnostic Agents] Itching  . Iron Itching and Other (See Comments)    "they gave me iron in dialysis; had to give me Benadryl cause I had to have the iron" (05/02/2012)  . Penicillins Other (See Comments)      . Plavix [Clopidogrel Bisulfate] Rash  . Amlodipine Swelling    Leg swelling  . Bactrim [Sulfamethoxazole-Trimethoprim] Rash  .  Dexilant [Dexlansoprazole] Other (See Comments)    Upset stomach  . Morphine And Related Itching    Itching in feet  . Nitrofurantoin Hives  . Prilosec [Omeprazole] Other (See Comments)    "back spasms"  . Venofer [Ferric Oxide] Itching    Patient reports using Benadryl prior to doses as [facility]  . Levaquin [Levofloxacin In D5w] Rash  . Protonix [Pantoprazole Sodium] Rash   Follow-up Information    Follow up with [facility], [place]. Schedule an appointment as soon as possible for a visit in 2 weeks.   Specialty:  Nephrology   Contact information:   [address] [address] [phone]       Follow up with Kate Sable, MD In 1 week.   Specialty:  Cardiology   Contact information:   [place] [address] [phone]        The results of significant  diagnostics from this hospitalization (including imaging, microbiology, ancillary and laboratory) are listed below for reference.    Significant Diagnostic Studies: Dg Chest Portable 1 View  01/27/2016  CLINICAL DATA:  Abdominal pain EXAM: PORTABLE CHEST 1 VIEW COMPARISON:  09/08/2015 FINDINGS: Is mild right basilar lingular atelectasis. There is no focal parenchymal opacity. There is no pleural effusion or pneumothorax. There is stable cardiomegaly. There is evidence of prior CABG. The osseous structures are unremarkable. IMPRESSION: No active disease. Electronically Signed   By: Kathreen Devoid   On: 01/27/2016 15:37    Microbiology: Recent Results (from the past 240 hour(s))  MRSA PCR Screening     Status: None   Collection Time: 01/27/16  8:15 PM  Result Value Ref Range Status   MRSA by PCR NEGATIVE NEGATIVE Final    Comment:        The GeneXpert MRSA Assay (FDA approved for NASAL specimens only), is one component of a comprehensive MRSA colonization surveillance program. It is not intended to diagnose MRSA infection nor to guide or monitor treatment for MRSA infections.      Labs: Basic Metabolic Panel:  Recent Labs Lab 01/27/16 1630  NA 134*  K 3.8  CL 96*  CO2 28  GLUCOSE 82  BUN 18  CREATININE 5.82*  CALCIUM 8.7*   Liver Function Tests:  Recent Labs Lab 01/27/16 1630  AST 22  ALT 16  ALKPHOS 72  BILITOT 0.5  PROT 6.4*  ALBUMIN 3.8   No results for input(s): LIPASE, AMYLASE in the last 168 hours. No results for input(s): AMMONIA in the last 168 hours. CBC:  Recent Labs Lab 01/27/16 1540 01/28/16 1045  WBC 6.9 8.6  HGB 11.3* 10.9*  HCT 35.4* 34.1*  MCV 101.4* 102.1*  PLT 267 238   Cardiac Enzymes:  Recent Labs Lab 01/27/16 1630  TROPONINI 0.03*   BNP: BNP (last 3 results)  Recent Labs  05/04/15 0518  BNP 1041.0*    ProBNP (last 3 results) No results for input(s): PROBNP in the last 8760 hours.  CBG: No results for input(s):  GLUCAP in the last 168 hours.     Signed:  Broady Lafoy MD.  [facility] 01/28/2016, 7:55 PM

## 2016-01-28 NOTE — Progress Notes (Signed)
Pt given discharge instructions after discussing & verbalized understanding   Instructions. Pt leaving unit via wheelchair to family vehicle  IV's dc'd

## 2016-01-28 NOTE — Care Management Note (Signed)
Case Management Note  Patient Details  Name: Shawna [name] MRN: [id number] Date of Birth: [birth year]  Subjective/Objective:     Spoke with patient who is alert and oriented from home with husband. Dialysis at [facility] in [place] MWF. Denies issues with medications  , ambulates without assistance and uses no assitive devices. No CM needs identified.            Action/Plan: Home with self care.   Expected Discharge Date:                  Expected Discharge Plan:  Home/Self Care  In-House Referral:     Discharge planning Services  CM Consult  Post Acute Care Choice:    Choice offered to:     DME Arranged:    DME Agency:     HH Arranged:    [place] Agency:     Status of Service:  Completed, signed off  If discussed at [employer] of Stay Meetings, dates discussed:    Additional Comments:  Alvie Heidelberg, RN 01/28/2016, 4:20 PM

## 2016-01-29 DIAGNOSIS — T82898A Other specified complication of vascular prosthetic devices, implants and grafts, initial encounter: Secondary | ICD-10-CM | POA: Diagnosis not present

## 2016-01-29 DIAGNOSIS — Z992 Dependence on renal dialysis: Secondary | ICD-10-CM | POA: Diagnosis not present

## 2016-01-29 DIAGNOSIS — N186 End stage renal disease: Secondary | ICD-10-CM | POA: Diagnosis not present

## 2016-02-01 DIAGNOSIS — T82898A Other specified complication of vascular prosthetic devices, implants and grafts, initial encounter: Secondary | ICD-10-CM | POA: Diagnosis not present

## 2016-02-01 DIAGNOSIS — Z992 Dependence on renal dialysis: Secondary | ICD-10-CM | POA: Diagnosis not present

## 2016-02-01 DIAGNOSIS — N186 End stage renal disease: Secondary | ICD-10-CM | POA: Diagnosis not present

## 2016-02-03 DIAGNOSIS — T82898A Other specified complication of vascular prosthetic devices, implants and grafts, initial encounter: Secondary | ICD-10-CM | POA: Diagnosis not present

## 2016-02-03 DIAGNOSIS — Z992 Dependence on renal dialysis: Secondary | ICD-10-CM | POA: Diagnosis not present

## 2016-02-03 DIAGNOSIS — N186 End stage renal disease: Secondary | ICD-10-CM | POA: Diagnosis not present

## 2016-02-05 DIAGNOSIS — N186 End stage renal disease: Secondary | ICD-10-CM | POA: Diagnosis not present

## 2016-02-05 DIAGNOSIS — T82898A Other specified complication of vascular prosthetic devices, implants and grafts, initial encounter: Secondary | ICD-10-CM | POA: Diagnosis not present

## 2016-02-05 DIAGNOSIS — Z992 Dependence on renal dialysis: Secondary | ICD-10-CM | POA: Diagnosis not present

## 2016-02-08 DIAGNOSIS — N186 End stage renal disease: Secondary | ICD-10-CM | POA: Diagnosis not present

## 2016-02-08 DIAGNOSIS — Z992 Dependence on renal dialysis: Secondary | ICD-10-CM | POA: Diagnosis not present

## 2016-02-08 DIAGNOSIS — T82898A Other specified complication of vascular prosthetic devices, implants and grafts, initial encounter: Secondary | ICD-10-CM | POA: Diagnosis not present

## 2016-02-10 DIAGNOSIS — Z992 Dependence on renal dialysis: Secondary | ICD-10-CM | POA: Diagnosis not present

## 2016-02-10 DIAGNOSIS — N186 End stage renal disease: Secondary | ICD-10-CM | POA: Diagnosis not present

## 2016-02-11 ENCOUNTER — Encounter (INDEPENDENT_AMBULATORY_CARE_PROVIDER_SITE_OTHER): Payer: Self-pay | Admitting: Internal Medicine

## 2016-02-11 ENCOUNTER — Ambulatory Visit (INDEPENDENT_AMBULATORY_CARE_PROVIDER_SITE_OTHER): Payer: Medicare HMO | Admitting: Internal Medicine

## 2016-02-11 ENCOUNTER — Encounter (INDEPENDENT_AMBULATORY_CARE_PROVIDER_SITE_OTHER): Payer: Self-pay | Admitting: *Deleted

## 2016-02-11 ENCOUNTER — Other Ambulatory Visit (INDEPENDENT_AMBULATORY_CARE_PROVIDER_SITE_OTHER): Payer: Self-pay | Admitting: *Deleted

## 2016-02-11 VITALS — BP 164/70 | HR 68 | Temp 98.0°F | Ht 61.0 in | Wt 136.1 lb

## 2016-02-11 DIAGNOSIS — Z8719 Personal history of other diseases of the digestive system: Secondary | ICD-10-CM

## 2016-02-11 DIAGNOSIS — K921 Melena: Secondary | ICD-10-CM

## 2016-02-11 NOTE — Progress Notes (Signed)
Subjective:    Patient ID: Shawna [name], female    DOB: 04-21-1941, 75 y.o.   MRN: [id number]  HPIHere today for f/u. She was last seen in February by me. In October of 2016 she underwent an EGD for melena and abdominal pain which revealed  erosive gastroduodenitis but no evidence of PUD.  Underwent procedure for melena and abdominal pain. Hx of bleeding from duodenal AVM about 2 yrs ago.  She tells me today she has been having some constipation. She is having a BM x 1 a day or ever 2 days. When she is constipated she will take a Doculax . Her BMs are dark brown. Stools were not black. Last hemoglobin in July was 10.9. Her appetite is okay. She has lost 2 pounds over the past month.  Hx of ESRD and takes dialysis M-W-F.  Dialysis for 6 yrs. Hx of cardiac stent per patient. CABG in 2013. Maintained on ASA 81mg .    CBC    Component Value Date/Time   WBC 8.6 01/28/2016 1045   RBC 3.34 (L) 01/28/2016 1045   HGB 10.9 (L) 01/28/2016 1045   HCT 34.1 (L) 01/28/2016 1045   PLT 238 01/28/2016 1045   MCV 102.1 (H) 01/28/2016 1045   MCH 32.6 01/28/2016 1045   MCHC 32.0 01/28/2016 1045   RDW 17.3 (H) 01/28/2016 1045   LYMPHSABS 0.8 09/08/2015 1310   MONOABS 0.8 09/08/2015 1310   EOSABS 0.5 09/08/2015 1310   BASOSABS 0.0 09/08/2015 1310     CBC Labs (Brief)          Component Value Date/Time   WBC 7.9 06/07/2015 1710   RBC 4.06 06/07/2015 1710   HGB 12.4 06/07/2015 1710   HCT 40.0 06/07/2015 1710   PLT 247 06/07/2015 1710   MCV 98.5 06/07/2015 1710   MCH 30.5 06/07/2015 1710   MCHC 31.0 06/07/2015 1710   RDW 20.2* 06/07/2015 1710   LYMPHSABS 0.6* 05/27/2015 2140   MONOABS 0.6 05/27/2015 2140   EOSABS 0.4 05/27/2015 2140   BASOSABS 0.1 05/27/2015 2140         05/06/2015 Hemoglobin 11.7.    04/30/2015 Procedure: EGD  Indications: Patient is 75 year old African-American female with multiple medical problems who presents with abdominal pain and  melena. She has history of bleeding from duodenal AV malformations about 2 years ago. She is on low-dose aspirin. Impression: Small sliding hiatal hernia with mild changes of reflux esophagitis limited to GE junction. Erosive gastroduodenitis but no evidence of peptic ulcer disease. Mild antral telangiectasia without stigmata of bleed.     03/26/2013 EGD and small bowel enteroscopy with control of bleeding (APC): melena Dr. Henrene Pastor: Non bleeding duodenal AVM status post APC. Incidental esophageal stricture.  Her last colonoscopy was in January 2013 by Dr. Britta Mccreedy.  Anemia, Hemocult +. Two polyps in the cecum, Sessile polyp in the descending colon, Anastomosis. Biopsy: All tubular adenoma.     Review of Systems Past Medical History:  Diagnosis Date  . Anxiety   . Arthritis   . AVM (arteriovenous malformation) of colon   . CAD (coronary artery disease)    a. 12/2011 NSTEMI/Cath/PCI LCX (2.25x14 Resolute DES) & D1 (2.25x22 Resolute DES);  b. 01/2012 Cath/PCI: LM 30, LAD 30p, 40-20m, D1 stent ok, 99 in sm branch of diag, LCX patent stent, OM1 20, RCA 95 ost (4.0x12 Promus DES), EF 55%;  c. 04/2012 Lexi Cardiolite  EF 48%, small area of scar @ base/mid inflat wall with mild peri-infarct ischemia.; CABG  12/4  . Carotid artery disease ([place])    a. A999333 LICA, [id number]   . Chronic bronchitis ([place])   . Chronic diastolic CHF (congestive heart failure) ([place])    a. 02/2012 Echo EF 60-65%, nl wall motion, Gr 1 DD, mod MR  . Colon cancer ([place]) 1992  . Esophageal stricture   . ESRD on hemodialysis ([place])    ESRD due to HTN, started dialysis 2011 and gets HD at [facility] with Dr Hinda Lenis on MWF schedule.  Access is LUA AVF as of Sept 2014.   Marland Kitchen GERD (gastroesophageal reflux disease)   . High cholesterol 12/2011  . History of blood transfusion 07/2011; 12/2011; 01/2012 X 2; 04/2012  . History  of gout   . History of lower GI bleeding   . Hypertension   . Iron deficiency anemia   . Mitral regurgitation    a. Moderate by echo, 02/2012  . Myocardial infarction ([place])   . Ovarian cancer ([place]) 1992  . Pneumonia ~ 2009  . PUD (peptic ulcer disease)   . TIA (transient ischemic attack)     Past Surgical History:  Procedure Laterality Date  . ABDOMINAL HYSTERECTOMY  1992  . APPENDECTOMY  06/1990  . AV FISTULA PLACEMENT  07/2009   left upper arm  . COLON RESECTION  1992  . CORONARY ANGIOPLASTY WITH STENT PLACEMENT  12/15/11   "2"  . CORONARY ANGIOPLASTY WITH STENT PLACEMENT  y/2013   "1; makes total of 3" (05/02/2012)  . CORONARY ARTERY BYPASS GRAFT  06/13/2012   Procedure: CORONARY ARTERY BYPASS GRAFTING (CABG);  Surgeon: Grace Isaac, MD;  Location: [place];  Service: Open Heart Surgery;  Laterality: N/A;  cabg x four;  using left internal mammary artery, and left leg greater saphenous vein harvested endoscopically  . DILATION AND CURETTAGE OF UTERUS    . ESOPHAGOGASTRODUODENOSCOPY  01/20/2012   Procedure: ESOPHAGOGASTRODUODENOSCOPY (EGD);  Surgeon: Ladene Artist, MD,FACG;  Location: [facility] ENDOSCOPY;  Service: Endoscopy;  Laterality: N/A;  . ESOPHAGOGASTRODUODENOSCOPY N/A 03/26/2013   Procedure: ESOPHAGOGASTRODUODENOSCOPY (EGD);  Surgeon: Irene Shipper, MD;  Location: [facility] ENDOSCOPY;  Service: Endoscopy;  Laterality: N/A;  . ESOPHAGOGASTRODUODENOSCOPY N/A 04/30/2015   Procedure: ESOPHAGOGASTRODUODENOSCOPY (EGD);  Surgeon: Rogene Houston, MD;  [facility];  Service: Endoscopy;  Laterality: N/A;  1pm - moved to 10/20 @ 1:10  . INTRAOPERATIVE TRANSESOPHAGEAL ECHOCARDIOGRAM  06/13/2012   Procedure: INTRAOPERATIVE TRANSESOPHAGEAL ECHOCARDIOGRAM;  Surgeon: Grace Isaac, MD;  Location: [facility];  Service: Open Heart Surgery;  Laterality: N/A;  . LEFT HEART CATHETERIZATION WITH CORONARY ANGIOGRAM N/A 12/15/2011   Procedure: LEFT HEART CATHETERIZATION WITH CORONARY ANGIOGRAM;   Surgeon: Burnell Blanks, MD;  Location: [facility] CATH LAB;  Service: Cardiovascular;  Laterality: N/A;  . LEFT HEART CATHETERIZATION WITH CORONARY ANGIOGRAM N/A 01/10/2012   Procedure: LEFT HEART CATHETERIZATION WITH CORONARY ANGIOGRAM;  Surgeon: Peter M Martinique, MD;  Location: [facility] CATH LAB;  Service: Cardiovascular;  Laterality: N/A;  . LEFT HEART CATHETERIZATION WITH CORONARY ANGIOGRAM N/A 06/08/2012   Procedure: LEFT HEART CATHETERIZATION WITH CORONARY ANGIOGRAM;  Surgeon: Burnell Blanks, MD;  Location: [facility] CATH LAB;  Service: Cardiovascular;  Laterality: N/A;  . LEFT HEART CATHETERIZATION WITH CORONARY/GRAFT ANGIOGRAM N/A 12/10/2013   Procedure: LEFT HEART CATHETERIZATION WITH Beatrix Fetters;  Surgeon: [name], MD;  Location: [facility] CATH LAB;  Service: Cardiovascular;  Laterality: N/A;  . OVARY SURGERY     ovarian cancer  . SHUNTOGRAM N/A 10/15/2013   Procedure: Fistulogram;  Surgeon:  Serafina Mitchell, MD;  Location: [facility] CATH LAB;  Service: Cardiovascular;  Laterality: N/A;  . THROMBECTOMY / ARTERIOVENOUS GRAFT REVISION  2011   left upper arm  . TUBAL LIGATION  1980's      Current Outpatient Prescriptions on File Prior to Visit  Medication Sig Dispense Refill  . ALPRAZolam (XANAX) 0.25 MG tablet Take 1-2 tablets by mouth at bedtime as needed for sleep.     Marland Kitchen amiodarone (PACERONE) 200 MG tablet Take 1 tablet (200 mg total) by mouth daily. 30 tablet 3  . aspirin EC 81 MG tablet Take 81 mg by mouth daily.     . bacitracin ophthalmic ointment     . cloNIDine (CATAPRES) 0.3 MG tablet Take 1 tablet (0.3 mg total) by mouth daily. *May take an additional tablet based on blood pressure levels*    . fluticasone (FLONASE) 50 MCG/ACT nasal spray Place 2 sprays into the nose daily.     . folic acid-vitamin b complex-vitamin c-selenium-zinc (DIALYVITE) 3 MG TABS Take 1 tablet by mouth daily.    . hydrALAZINE (APRESOLINE) 50 MG tablet Take 3 tablets (150 mg total) by mouth 2 (two)  times daily.    . isosorbide dinitrate (ISORDIL) 30 MG tablet Take 1 tablet (30 mg total) by mouth 3 (three) times daily. 90 tablet 6  . labetalol (NORMODYNE) 200 MG tablet Take 1.5 tablets (300 mg total) by mouth 2 (two) times daily. 60 tablet 6  . lanthanum (FOSRENOL) 1000 MG chewable tablet Chew 1,000 mg by mouth 3 (three) times daily after meals.    Marland Kitchen NITROSTAT 0.4 MG SL tablet DISSOLVE ONE TABLET UNDER THE TONGUE EVERY 5 MINUTES AS NEEDED FOR CHEST PAIN.  DO NOT EXCEED A TOTAL OF 3 DOSES IN 15 MINUTES AS NEEDED 25 tablet 3  . omeprazole (PRILOSEC) 20 MG capsule Take 1 capsule (20 mg total) by mouth daily. 30 capsule 5  . ondansetron (ZOFRAN-ODT) 4 MG disintegrating tablet Take 4 mg by mouth every 8 (eight) hours as needed for nausea or vomiting. nausea    . PROAIR HFA 108 (90 BASE) MCG/ACT inhaler Inhale 1 puff into the lungs every 6 (six) hours as needed for wheezing or shortness of breath.     . SENSIPAR 30 MG tablet Take 30 mg by mouth daily with supper.     . simvastatin (ZOCOR) 20 MG tablet TAKE ONE TABLET BY MOUTH AT BEDTIME 30 tablet 6   No current facility-administered medications on file prior to visit.        Objective:   Physical Exam Blood pressure (!) 164/70, pulse 68, temperature 98 F (36.7 C), height 5\' 1"  (1.549 m), weight 136 lb 1.6 oz (61.7 kg).  Alert and oriented. Skin warm and dry. Oral mucosa is moist.   . Sclera anicteric, conjunctivae is pink. Thyroid not enlarged. No cervical lymphadenopathy. Lungs clear. Heart regular rate and rhythm.  Abdomen is soft. Bowel sounds are positive. No hepatomegaly. No abdominal masses felt. No tenderness.  No edema to lower extremities.        Assessment & Plan:  Hx of melena. Hemoglobin in July was 109.  She will have OV in 3 months.  CBC in 4 weeks.

## 2016-02-11 NOTE — Patient Instructions (Signed)
CBC in 4 weeks. OV in 3 months.

## 2016-02-12 DIAGNOSIS — N186 End stage renal disease: Secondary | ICD-10-CM | POA: Diagnosis not present

## 2016-02-12 DIAGNOSIS — Z992 Dependence on renal dialysis: Secondary | ICD-10-CM | POA: Diagnosis not present

## 2016-02-15 DIAGNOSIS — Z992 Dependence on renal dialysis: Secondary | ICD-10-CM | POA: Diagnosis not present

## 2016-02-15 DIAGNOSIS — N186 End stage renal disease: Secondary | ICD-10-CM | POA: Diagnosis not present

## 2016-02-17 DIAGNOSIS — Z992 Dependence on renal dialysis: Secondary | ICD-10-CM | POA: Diagnosis not present

## 2016-02-17 DIAGNOSIS — N186 End stage renal disease: Secondary | ICD-10-CM | POA: Diagnosis not present

## 2016-02-19 DIAGNOSIS — Z992 Dependence on renal dialysis: Secondary | ICD-10-CM | POA: Diagnosis not present

## 2016-02-19 DIAGNOSIS — N186 End stage renal disease: Secondary | ICD-10-CM | POA: Diagnosis not present

## 2016-02-22 DIAGNOSIS — N186 End stage renal disease: Secondary | ICD-10-CM | POA: Diagnosis not present

## 2016-02-22 DIAGNOSIS — Z992 Dependence on renal dialysis: Secondary | ICD-10-CM | POA: Diagnosis not present

## 2016-02-22 DIAGNOSIS — Z1231 Encounter for screening mammogram for malignant neoplasm of breast: Secondary | ICD-10-CM | POA: Diagnosis not present

## 2016-02-23 ENCOUNTER — Encounter: Payer: Self-pay | Admitting: Adult Health

## 2016-02-23 ENCOUNTER — Ambulatory Visit (INDEPENDENT_AMBULATORY_CARE_PROVIDER_SITE_OTHER): Payer: Medicare HMO | Admitting: Adult Health

## 2016-02-23 VITALS — BP 155/68 | HR 65 | Ht 61.0 in | Wt 137.0 lb

## 2016-02-23 DIAGNOSIS — I10 Essential (primary) hypertension: Secondary | ICD-10-CM | POA: Diagnosis not present

## 2016-02-23 DIAGNOSIS — I5032 Chronic diastolic (congestive) heart failure: Secondary | ICD-10-CM | POA: Diagnosis not present

## 2016-02-23 NOTE — Patient Instructions (Signed)
Medication Instructions:  Continue all current medications.  Labwork: none  Testing/Procedures: none  Follow-Up: 1 month   Any Other Special Instructions Will Be Listed Below (If Applicable).  If you need a refill on your cardiac medications before your next appointment, please call your pharmacy.  

## 2016-02-23 NOTE — Progress Notes (Signed)
Cardiology Office Note   Date:  02/23/2016   ID:  Shawna [name], DOB [birth year], MRN [id number]  PCP:  [name], MD  Cardiologist:  Woodroe Chen, NP   No chief complaint on file.     History of Present Illness: Shawna [name] is a 75 y.o. female who presents for ongoing assessment and management of chronic chest discomfort. Coronary artery disease with CABG in 2013.  She has a history of anxiety and stress related to family illnesses which include cancer in her daughter. East time she thinks about family issues she begins to have chest discomfort.  She was last seen by Dr. Dr. Bronson Ing on 01/06/2016 she was given reassurance that she had a normal stress test in February 2016. She was continued on aspirin labetalol isosorbide dinitrate 30 mg 3 times a day, amlodipine and statin therapy. She also had a history of PSVT which is stable on amiodarone.  The patient called our office on 01/27/2016 with complaining of chest pain headache and hypertension she had not taken her labetalol that day. She had just had dialysis.  She was advised to report to the emergency room for further evaluation. She was admitted on 07/19 2017, was found to have hypertensive urgency secondary to noncompliance with demand ischemia without evidence of ACS.  Once blood pressure was controlled and normalize she was given counseling concerning medical compliance, to include taking medications as directed and she was only taking her medications once a day as opposed to twice a day and 3 times a day depending upon the dosage per medication. On day of discharge blood pressure was 170/73 she was to continue follow-up with her nephrologist Dr. Hinda Lenis.   She comes today with continued complaints of leg low blood pressure. Blood pressure is well-controlled today on this followup with changes in medication to include increased dose of labetalol. She states that she takes it each morning and also continues to take  her medication prior to dialysis. She was finding it when she goes to dialysis her blood pressure rises incentive decreases. She denies any chest pain or shortness of breath. She states she is medically compliant and she was able to verbalize to me what her medications were and how she takes it.  Past Medical History:  Diagnosis Date  . Anxiety   . Arthritis   . AVM (arteriovenous malformation) of colon   . CAD (coronary artery disease)    a. 12/2011 NSTEMI/Cath/PCI LCX (2.25x14 Resolute DES) & D1 (2.25x22 Resolute DES);  b. 01/2012 Cath/PCI: LM 30, LAD 30p, 40-69m, D1 stent ok, 99 in sm branch of diag, LCX patent stent, OM1 20, RCA 95 ost (4.0x12 Promus DES), EF 55%;  c. 04/2012 Lexi Cardiolite  EF 48%, small area of scar @ base/mid inflat wall with mild peri-infarct ischemia.; CABG 12/4  . Carotid artery disease ([place])    a. [id number] LICA, [id number]   . Chronic bronchitis ([place])   . Chronic diastolic CHF (congestive heart failure) ([place])    a. 02/2012 Echo EF 60-65%, nl wall motion, Gr 1 DD, mod MR  . Colon cancer ([place]) 1992  . Esophageal stricture   . ESRD on hemodialysis ([place])    ESRD due to HTN, started dialysis 2011 and gets HD at [facility] with Dr Hinda Lenis on MWF schedule.  Access is LUA AVF as of Sept 2014.   Marland Kitchen GERD (gastroesophageal reflux disease)   . High cholesterol 12/2011  . History of blood transfusion 07/2011; 12/2011; 01/2012 X  2; 04/2012  . History of gout   . History of lower GI bleeding   . Hypertension   . Iron deficiency anemia   . Mitral regurgitation    a. Moderate by echo, 02/2012  . Myocardial infarction ([place])   . Ovarian cancer ([place]) 1992  . Pneumonia ~ 2009  . PUD (peptic ulcer disease)   . TIA (transient ischemic attack)     Past Surgical History:  Procedure Laterality Date  . ABDOMINAL HYSTERECTOMY  1992  . APPENDECTOMY  06/1990  . AV FISTULA PLACEMENT  07/2009   left upper arm  . COLON RESECTION  1992  . CORONARY ANGIOPLASTY WITH STENT PLACEMENT  12/15/11   "2"   . CORONARY ANGIOPLASTY WITH STENT PLACEMENT  y/2013   "1; makes total of 3" (05/02/2012)  . CORONARY ARTERY BYPASS GRAFT  06/13/2012   Procedure: CORONARY ARTERY BYPASS GRAFTING (CABG);  Surgeon: Grace Isaac, MD;  Location: [facility];  Service: Open Heart Surgery;  Laterality: N/A;  cabg x four;  using left internal mammary artery, and left leg greater saphenous vein harvested endoscopically  . DILATION AND CURETTAGE OF UTERUS    . ESOPHAGOGASTRODUODENOSCOPY  01/20/2012   Procedure: ESOPHAGOGASTRODUODENOSCOPY (EGD);  Surgeon: Ladene Artist, MD,FACG;  Location: [facility] ENDOSCOPY;  Service: Endoscopy;  Laterality: N/A;  . ESOPHAGOGASTRODUODENOSCOPY N/A 03/26/2013   Procedure: ESOPHAGOGASTRODUODENOSCOPY (EGD);  Surgeon: Irene Shipper, MD;  Location: [facility] ENDOSCOPY;  Service: Endoscopy;  Laterality: N/A;  . ESOPHAGOGASTRODUODENOSCOPY N/A 04/30/2015   Procedure: ESOPHAGOGASTRODUODENOSCOPY (EGD);  Surgeon: Rogene Houston, MD;  [facility]: [facility] ENDO [facility];  Service: Endoscopy;  Laterality: N/A;  1pm - moved to 10/20 @ 1:10  . INTRAOPERATIVE TRANSESOPHAGEAL ECHOCARDIOGRAM  06/13/2012   Procedure: INTRAOPERATIVE TRANSESOPHAGEAL ECHOCARDIOGRAM;  Surgeon: Grace Isaac, MD;  Location: [facility];  Service: Open Heart Surgery;  Laterality: N/A;  . LEFT HEART CATHETERIZATION WITH CORONARY ANGIOGRAM N/A 12/15/2011   Procedure: LEFT HEART CATHETERIZATION WITH CORONARY ANGIOGRAM;  Surgeon: Burnell Blanks, MD;  Location: [facility] CATH LAB;  Service: Cardiovascular;  Laterality: N/A;  . LEFT HEART CATHETERIZATION WITH CORONARY ANGIOGRAM N/A 01/10/2012   Procedure: LEFT HEART CATHETERIZATION WITH CORONARY ANGIOGRAM;  Surgeon: Peter M Martinique, MD;  Location: [facility] CATH LAB;  Service: Cardiovascular;  Laterality: N/A;  . LEFT HEART CATHETERIZATION WITH CORONARY ANGIOGRAM N/A 06/08/2012   Procedure: LEFT HEART CATHETERIZATION WITH CORONARY ANGIOGRAM;  Surgeon: Burnell Blanks, MD;  Location: [facility] CATH LAB;  Service:  Cardiovascular;  Laterality: N/A;  . LEFT HEART CATHETERIZATION WITH CORONARY/GRAFT ANGIOGRAM N/A 12/10/2013   Procedure: LEFT HEART CATHETERIZATION WITH Beatrix Fetters;  Surgeon: [name], MD;  Location: [facility] CATH LAB;  Service: Cardiovascular;  Laterality: N/A;  . OVARY SURGERY     ovarian cancer  . SHUNTOGRAM N/A 10/15/2013   Procedure: Fistulogram;  Surgeon: Serafina Mitchell, MD;  Location: [facility] CATH LAB;  Service: Cardiovascular;  Laterality: N/A;  . THROMBECTOMY / ARTERIOVENOUS GRAFT REVISION  2011   left upper arm  . TUBAL LIGATION  1980's     Current Outpatient Prescriptions  Medication Sig Dispense Refill  . ALPRAZolam (XANAX) 0.25 MG tablet Take 1-2 tablets by mouth at bedtime as needed for sleep.     Marland Kitchen amiodarone (PACERONE) 200 MG tablet Take 1 tablet (200 mg total) by mouth daily. 30 tablet 3  . aspirin EC 81 MG tablet Take 81 mg by mouth daily.     . bacitracin ophthalmic ointment     . cloNIDine (CATAPRES) 0.3 MG  tablet Take 1 tablet (0.3 mg total) by mouth daily. *May take an additional tablet based on blood pressure levels*    . fluticasone (FLONASE) 50 MCG/ACT nasal spray Place 2 sprays into the nose daily.     . folic acid-vitamin b complex-vitamin c-selenium-zinc (DIALYVITE) 3 MG TABS Take 1 tablet by mouth daily.    . hydrALAZINE (APRESOLINE) 50 MG tablet Take 3 tablets (150 mg total) by mouth 2 (two) times daily.    . isosorbide dinitrate (ISORDIL) 30 MG tablet Take 1 tablet (30 mg total) by mouth 3 (three) times daily. 90 tablet 6  . labetalol (NORMODYNE) 200 MG tablet Take 1.5 tablets (300 mg total) by mouth 2 (two) times daily. 60 tablet 6  . lanthanum (FOSRENOL) 1000 MG chewable tablet Chew 1,000 mg by mouth 3 (three) times daily after meals.    Marland Kitchen NITROSTAT 0.4 MG SL tablet DISSOLVE ONE TABLET UNDER THE TONGUE EVERY 5 MINUTES AS NEEDED FOR CHEST PAIN.  DO NOT EXCEED A TOTAL OF 3 DOSES IN 15 MINUTES AS NEEDED 25 tablet 3  . omeprazole (PRILOSEC) 20 MG  capsule Take 1 capsule (20 mg total) by mouth daily. 30 capsule 5  . ondansetron (ZOFRAN-ODT) 4 MG disintegrating tablet Take 4 mg by mouth every 8 (eight) hours as needed for nausea or vomiting. nausea    . PROAIR HFA 108 (90 BASE) MCG/ACT inhaler Inhale 1 puff into the lungs every 6 (six) hours as needed for wheezing or shortness of breath.     . SENSIPAR 30 MG tablet Take 30 mg by mouth daily with supper.     . simvastatin (ZOCOR) 20 MG tablet TAKE ONE TABLET BY MOUTH AT BEDTIME 30 tablet 6   No current facility-administered medications for this visit.     Allergies:   Aspirin; Contrast media [iodinated diagnostic agents]; Iron; Penicillins; Plavix [clopidogrel bisulfate]; Amlodipine; Bactrim [sulfamethoxazole-trimethoprim]; Dexilant [dexlansoprazole]; Morphine and related; Nitrofurantoin; Prilosec [omeprazole]; Venofer [ferric oxide]; Levaquin [levofloxacin in d5w]; and Protonix [pantoprazole sodium]    Social History:  The patient  reports that she has never smoked. She has never used smokeless tobacco. She reports that she does not drink alcohol or use drugs.   Family History:  The patient's family history includes Diabetes in her brother, father, mother, and sister; Heart attack in her brother, brother, and mother; Heart disease in her father and mother; Hyperlipidemia in her brother, father, and mother; Hypertension in her father, mother, sister, and sister.    ROS: All other systems are reviewed and negative. Unless otherwise mentioned in H&P    PHYSICAL EXAM: VS:  BP (!) 155/68   Pulse 65   Ht 5\' 1"  (1.549 m)   Wt 137 lb (62.1 kg)   BMI 25.89 kg/m  , BMI Body mass index is 25.89 kg/m. GEN: Well nourished, well developed, in no acute distress  HEENT: normal  Neck: no JVD, carotid bruits, or masses Cardiac: RRR; was soft S 4, no murmurs, rubs, or gallops,no edema  Respiratory:  clear to auscultation bilaterally, normal work of breathing GI: soft, nontender, nondistended, +  BS MS: no deformity or atrophy dialysis shunt to the left forearm.  Skin: warm and dry, no rash Neuro:  Strength and sensation are intact Psych: euthymic mood, full affect   Recent Labs: 05/04/2015: B Natriuretic Peptide 1,041.0 08/31/2015: Magnesium 1.6 09/08/2015: TSH 0.999 01/27/2016: ALT 16; BUN 18; Creatinine, Ser 5.82; Potassium 3.8; Sodium 134 01/28/2016: Hemoglobin 10.9; Platelets 238    Lipid Panel  Component Value Date/Time   CHOL 129 07/07/2012 0815   TRIG 105 01/21/2015 2030   HDL 51 07/07/2012 0815   CHOLHDL 2.5 07/07/2012 0815   VLDL 19 07/07/2012 0815   LDLCALC 59 07/07/2012 0815      Wt Readings from Last 3 Encounters:  02/23/16 137 lb (62.1 kg)  02/11/16 136 lb 1.6 oz (61.7 kg)  01/28/16 135 lb 9.3 oz (61.5 kg)     ASSESSMENT AND PLAN:  1.  Malignant hypertension: blood pressure is better controlled currently on medication adjustments prior to recent discharge from the hospital in which labetalol was increased to 1 and half tablets (300 mg twice a day. She continues to take hydralazin 150 mg 3 times a day. She is also on clonidine 0.3 mg daily. She has not had any further incidence of noncompliance. She is very focused on her blood pressure control and takes her blood pressure often. Do to improve and a blood pressure from discharge from 0000000 systolic to I [id number] I will not make any further changes at this time. This is the best that has been recorded for her. She should continue with nephrology for ongoing management and labs.  2. Chronic diastolic heart failure:findings status is maintained via dialysis.   Current medicines are reviewed at length with the patient today.    Labs/ tests ordered today include:  No orders of the defined types were placed in this encounter.    Disposition:   FU with 3 months.  Signed, Jory Sims, NP  02/23/2016 4:13 PM    [facility] [address] [place], [place] [address] Phone: [phone]; Fax: [phone]

## 2016-02-24 DIAGNOSIS — Z6826 Body mass index (BMI) 26.0-26.9, adult: Secondary | ICD-10-CM | POA: Diagnosis not present

## 2016-02-24 DIAGNOSIS — Z992 Dependence on renal dialysis: Secondary | ICD-10-CM | POA: Diagnosis not present

## 2016-02-24 DIAGNOSIS — R61 Generalized hyperhidrosis: Secondary | ICD-10-CM | POA: Diagnosis not present

## 2016-02-24 DIAGNOSIS — E039 Hypothyroidism, unspecified: Secondary | ICD-10-CM | POA: Diagnosis not present

## 2016-02-24 DIAGNOSIS — N186 End stage renal disease: Secondary | ICD-10-CM | POA: Diagnosis not present

## 2016-02-26 DIAGNOSIS — N186 End stage renal disease: Secondary | ICD-10-CM | POA: Diagnosis not present

## 2016-02-26 DIAGNOSIS — Z992 Dependence on renal dialysis: Secondary | ICD-10-CM | POA: Diagnosis not present

## 2016-02-29 DIAGNOSIS — N186 End stage renal disease: Secondary | ICD-10-CM | POA: Diagnosis not present

## 2016-02-29 DIAGNOSIS — Z992 Dependence on renal dialysis: Secondary | ICD-10-CM | POA: Diagnosis not present

## 2016-03-02 DIAGNOSIS — Z79899 Other long term (current) drug therapy: Secondary | ICD-10-CM | POA: Diagnosis not present

## 2016-03-02 DIAGNOSIS — N186 End stage renal disease: Secondary | ICD-10-CM | POA: Diagnosis not present

## 2016-03-02 DIAGNOSIS — E785 Hyperlipidemia, unspecified: Secondary | ICD-10-CM | POA: Diagnosis not present

## 2016-03-02 DIAGNOSIS — Z992 Dependence on renal dialysis: Secondary | ICD-10-CM | POA: Diagnosis not present

## 2016-03-03 DIAGNOSIS — R935 Abnormal findings on diagnostic imaging of other abdominal regions, including retroperitoneum: Secondary | ICD-10-CM | POA: Diagnosis not present

## 2016-03-03 DIAGNOSIS — I700 Atherosclerosis of aorta: Secondary | ICD-10-CM | POA: Diagnosis not present

## 2016-03-03 DIAGNOSIS — K5730 Diverticulosis of large intestine without perforation or abscess without bleeding: Secondary | ICD-10-CM | POA: Diagnosis not present

## 2016-03-03 DIAGNOSIS — R140 Abdominal distension (gaseous): Secondary | ICD-10-CM | POA: Diagnosis not present

## 2016-03-03 DIAGNOSIS — N261 Atrophy of kidney (terminal): Secondary | ICD-10-CM | POA: Diagnosis not present

## 2016-03-03 DIAGNOSIS — Z90710 Acquired absence of both cervix and uterus: Secondary | ICD-10-CM | POA: Diagnosis not present

## 2016-03-03 DIAGNOSIS — R634 Abnormal weight loss: Secondary | ICD-10-CM | POA: Diagnosis not present

## 2016-03-03 DIAGNOSIS — R109 Unspecified abdominal pain: Secondary | ICD-10-CM | POA: Diagnosis not present

## 2016-03-03 DIAGNOSIS — R61 Generalized hyperhidrosis: Secondary | ICD-10-CM | POA: Diagnosis not present

## 2016-03-04 DIAGNOSIS — N186 End stage renal disease: Secondary | ICD-10-CM | POA: Diagnosis not present

## 2016-03-04 DIAGNOSIS — Z992 Dependence on renal dialysis: Secondary | ICD-10-CM | POA: Diagnosis not present

## 2016-03-07 DIAGNOSIS — Z992 Dependence on renal dialysis: Secondary | ICD-10-CM | POA: Diagnosis not present

## 2016-03-07 DIAGNOSIS — N186 End stage renal disease: Secondary | ICD-10-CM | POA: Diagnosis not present

## 2016-03-08 ENCOUNTER — Encounter (HOSPITAL_COMMUNITY): Payer: Self-pay | Admitting: *Deleted

## 2016-03-08 ENCOUNTER — Emergency Department (HOSPITAL_COMMUNITY)
Admission: EM | Admit: 2016-03-08 | Discharge: 2016-03-09 | Disposition: A | Discharge status: Home / Self Care | Payer: Medicare HMO | Attending: Emergency Medicine | Admitting: Emergency Medicine

## 2016-03-08 ENCOUNTER — Emergency Department (HOSPITAL_COMMUNITY): Payer: Medicare HMO

## 2016-03-08 DIAGNOSIS — Z7982 Long term (current) use of aspirin: Secondary | ICD-10-CM | POA: Insufficient documentation

## 2016-03-08 DIAGNOSIS — N186 End stage renal disease: Secondary | ICD-10-CM | POA: Insufficient documentation

## 2016-03-08 DIAGNOSIS — Z8543 Personal history of malignant neoplasm of ovary: Secondary | ICD-10-CM | POA: Insufficient documentation

## 2016-03-08 DIAGNOSIS — R51 Headache: Secondary | ICD-10-CM | POA: Insufficient documentation

## 2016-03-08 DIAGNOSIS — Z85038 Personal history of other malignant neoplasm of large intestine: Secondary | ICD-10-CM | POA: Insufficient documentation

## 2016-03-08 DIAGNOSIS — Z79899 Other long term (current) drug therapy: Secondary | ICD-10-CM | POA: Insufficient documentation

## 2016-03-08 DIAGNOSIS — I10 Essential (primary) hypertension: Secondary | ICD-10-CM

## 2016-03-08 DIAGNOSIS — Z992 Dependence on renal dialysis: Secondary | ICD-10-CM | POA: Diagnosis not present

## 2016-03-08 DIAGNOSIS — I5032 Chronic diastolic (congestive) heart failure: Secondary | ICD-10-CM | POA: Diagnosis not present

## 2016-03-08 DIAGNOSIS — I132 Hypertensive heart and chronic kidney disease with heart failure and with stage 5 chronic kidney disease, or end stage renal disease: Secondary | ICD-10-CM | POA: Insufficient documentation

## 2016-03-08 DIAGNOSIS — R519 Headache, unspecified: Secondary | ICD-10-CM

## 2016-03-08 DIAGNOSIS — I2510 Atherosclerotic heart disease of native coronary artery without angina pectoris: Secondary | ICD-10-CM | POA: Diagnosis not present

## 2016-03-08 LAB — CBC WITH DIFFERENTIAL/PLATELET
BASOS ABS: 0 10*3/uL (ref 0.0–0.1)
BASOS PCT: 1 %
Eosinophils Absolute: 0.5 10*3/uL (ref 0.0–0.7)
Eosinophils Relative: 8 %
HEMATOCRIT: 35.4 % — AB (ref 36.0–46.0)
HEMOGLOBIN: 11.2 g/dL — AB (ref 12.0–15.0)
Lymphocytes Relative: 16 %
Lymphs Abs: 1 10*3/uL (ref 0.7–4.0)
MCH: 31.8 pg (ref 26.0–34.0)
MCHC: 31.6 g/dL (ref 30.0–36.0)
MCV: 100.6 fL — ABNORMAL HIGH (ref 78.0–100.0)
Monocytes Absolute: 0.8 10*3/uL (ref 0.1–1.0)
Monocytes Relative: 13 %
NEUTROS ABS: 3.8 10*3/uL (ref 1.7–7.7)
NEUTROS PCT: 62 %
Platelets: 217 10*3/uL (ref 150–400)
RBC: 3.52 MIL/uL — ABNORMAL LOW (ref 3.87–5.11)
RDW: 16.5 % — ABNORMAL HIGH (ref 11.5–15.5)
WBC: 6.1 10*3/uL (ref 4.0–10.5)

## 2016-03-08 MED ORDER — ACETAMINOPHEN 500 MG PO TABS
1000.0000 mg | ORAL_TABLET | Freq: Once | ORAL | Status: AC
  Administered 2016-03-08: 1000 mg via ORAL
  Filled 2016-03-08: qty 2

## 2016-03-08 MED ORDER — DIPHENHYDRAMINE HCL 25 MG PO CAPS
25.0000 mg | ORAL_CAPSULE | Freq: Once | ORAL | Status: AC
  Administered 2016-03-08: 25 mg via ORAL
  Filled 2016-03-08: qty 1

## 2016-03-08 MED ORDER — CLONIDINE HCL 0.2 MG PO TABS
0.3000 mg | ORAL_TABLET | Freq: Once | ORAL | Status: AC
  Administered 2016-03-08: 0.3 mg via ORAL
  Filled 2016-03-08: qty 1

## 2016-03-08 NOTE — ED Triage Notes (Signed)
Pt c/o headache that started today; pt denies any n/v; pt states she has been cold all day

## 2016-03-08 NOTE — ED Provider Notes (Addendum)
TIME SEEN: 11:38  CHIEF COMPLAINT: Headache  HPI: HPI Comments:  Shawna [name] is a 75 y.o. female with history of CAD, CHF, hypertension, hyperlipidemia, end-stage renal disease on hemodialysis Monday, Wednesday and Friday who presents to the Emergency Department complaining of a headache on the top of her head beginning at 1900 tonight.  She reports when she has had similar headaches at home she has taken Tylenol but this causes her feet itch so she did not take any Tylenol tonight. Tried to aspirin prior to arrival. Describes it as gradual onset, throbbing in nature. Headache started at 7 PM. Worse when she turns her head. No alleviating factors currently. She notes associated lower abdominal discomfort. She states she does not describe it as a "pain".  She states that she has taken all of her blood pressure medication including labetalol, isordil and hydralazine, but has not take her clonidine yet.  She took her blood pressure at home and states it was 189/80.  She recalls that her blood pressure is never controlled and often runs to 200 at night.  She goes to dialysis three times a week and states she's never missed a treatment.She denies changes in vision, numbness, weakness, chest pain, SOB, vaginal bleeding or discharge, and nausea, vomiting or diarrhea tonight.  She no longer makes urine. Denies being on anticoagulation. Does take aspirin daily. No head injury. Her PCP is Dr. Nadara Mustard in [place].  ROS: See HPI Constitutional: no fever  Eyes: no drainage  ENT: no runny nose   Cardiovascular:  no chest pain  Resp: no SOB  GI: no vomiting GU: no dysuria Integumentary: no rash  Allergy: no hives  Musculoskeletal: no leg swelling  Neurological: no slurred speech ROS otherwise negative  PAST MEDICAL HISTORY/PAST SURGICAL HISTORY:  Past Medical History:  Diagnosis Date  . Anxiety   . Arthritis   . AVM (arteriovenous malformation) of colon   . CAD (coronary artery disease)    a. 12/2011  NSTEMI/Cath/PCI LCX (2.25x14 Resolute DES) & D1 (2.25x22 Resolute DES);  b. 01/2012 Cath/PCI: LM 30, LAD 30p, 40-16m, D1 stent ok, 99 in sm branch of diag, LCX patent stent, OM1 20, RCA 95 ost (4.0x12 Promus DES), EF 55%;  c. 04/2012 Lexi Cardiolite  EF 48%, small area of scar @ base/mid inflat wall with mild peri-infarct ischemia.; CABG 12/4  . Carotid artery disease ([place])    a. [id number] LICA, [id number]   . Chronic bronchitis ([place])   . Chronic diastolic CHF (congestive heart failure) ([place])    a. 02/2012 Echo EF 60-65%, nl wall motion, Gr 1 DD, mod MR  . Colon cancer ([place]) 1992  . Esophageal stricture   . ESRD on hemodialysis ([place])    ESRD due to HTN, started dialysis 2011 and gets HD at [facility] with Dr Hinda Lenis on MWF schedule.  Access is LUA AVF as of Sept 2014.   Marland Kitchen GERD (gastroesophageal reflux disease)   . High cholesterol 12/2011  . History of blood transfusion 07/2011; 12/2011; 01/2012 X 2; 04/2012  . History of gout   . History of lower GI bleeding   . Hypertension   . Iron deficiency anemia   . Mitral regurgitation    a. Moderate by echo, 02/2012  . Myocardial infarction ([name])   . Ovarian cancer ([place]) 1992  . Pneumonia ~ 2009  . PUD (peptic ulcer disease)   . TIA (transient ischemic attack)     MEDICATIONS:  Prior to Admission medications   Medication Sig  Start Date End Date Taking? Authorizing Provider  ALPRAZolam Duanne Moron) 0.25 MG tablet Take 1-2 tablets by mouth at bedtime as needed for sleep.  11/19/15   Historical Provider, MD  amiodarone (PACERONE) 200 MG tablet Take 1 tablet (200 mg total) by mouth daily. 09/23/15   Herminio Commons, MD  aspirin EC 81 MG tablet Take 81 mg by mouth daily.     Historical Provider, MD  bacitracin ophthalmic ointment  12/08/15   Historical Provider, MD  cloNIDine (CATAPRES) 0.3 MG tablet Take 1 tablet (0.3 mg total) by mouth daily. *May take an additional tablet based on blood pressure levels* 01/28/16   Rexene Alberts, MD  fluticasone [facility]) 50  MCG/ACT nasal spray Place 2 sprays into the nose daily.     Historical Provider, MD  folic acid-vitamin b complex-vitamin c-selenium-zinc (DIALYVITE) 3 MG TABS Take 1 tablet by mouth daily.    Historical Provider, MD  hydrALAZINE (APRESOLINE) 50 MG tablet Take 3 tablets (150 mg total) by mouth 2 (two) times daily. 01/28/16   Rexene Alberts, MD  isosorbide dinitrate (ISORDIL) 30 MG tablet Take 1 tablet (30 mg total) by mouth 3 (three) times daily. 07/28/15   Herminio Commons, MD  labetalol (NORMODYNE) 200 MG tablet Take 1.5 tablets (300 mg total) by mouth 2 (two) times daily. 06/17/15   Herminio Commons, MD  lanthanum (FOSRENOL) 1000 MG chewable tablet Chew 1,000 mg by mouth 3 (three) times daily after meals.    Historical Provider, MD  NITROSTAT 0.4 MG SL tablet DISSOLVE ONE TABLET UNDER THE TONGUE EVERY 5 MINUTES AS NEEDED FOR CHEST PAIN.  DO NOT EXCEED A TOTAL OF 3 DOSES IN 15 MINUTES AS NEEDED 01/20/16   Herminio Commons, MD  omeprazole (PRILOSEC) 20 MG capsule Take 1 capsule (20 mg total) by mouth daily. 05/12/15   Butch Penny, NP  ondansetron (ZOFRAN-ODT) 4 MG disintegrating tablet Take 4 mg by mouth every 8 (eight) hours as needed for nausea or vomiting. nausea 05/18/15   Historical Provider, MD  PROAIR HFA 108 (90 BASE) MCG/ACT inhaler Inhale 1 puff into the lungs every 6 (six) hours as needed for wheezing or shortness of breath.  01/09/15   Historical Provider, MD  SENSIPAR 30 MG tablet Take 30 mg by mouth daily with supper.  12/21/12   Historical Provider, MD  simvastatin (ZOCOR) 20 MG tablet TAKE ONE TABLET BY MOUTH AT BEDTIME 07/22/15   Herminio Commons, MD    ALLERGIES:  Allergies  Allergen Reactions  . Aspirin Other (See Comments)    Mess up her stomach; "makes my bowels have blood in them". Takes 81 mg EC Aspirin   . Contrast Media [Iodinated Diagnostic Agents] Itching  . Iron Itching and Other (See Comments)    "they gave me iron in dialysis; had to give me Benadryl cause I  had to have the iron" (05/02/2012)  . Penicillins Other (See Comments)    "makes me real weak when I take it; like I'll pass out"Has patient had a PCN reaction causing immediate rash, facial/tongue/throat swelling, SOB or lightheadedness with hypotension: Nono Has patient had a PCN reaction causing severe rash involving mucus membranes or skin necrosis: Nono Has patient had a PCN reaction that required hospitalization Nono Has patient had a PCN reaction occurring within the last 10 years: Nono If all of the above  . Plavix [Clopidogrel Bisulfate] Rash  . Amlodipine Swelling    Leg swelling  . Bactrim [Sulfamethoxazole-Trimethoprim] Rash  . Dexilant [  Dexlansoprazole] Other (See Comments)    Upset stomach  . Morphine And Related Itching    Itching in feet  . Nitrofurantoin Hives  . Prilosec [Omeprazole] Other (See Comments)    "back spasms"  . Venofer [Ferric Oxide] Itching    Patient reports using Benadryl prior to doses as [facility]  . Levaquin [Levofloxacin In D5w] Rash  . Protonix [Pantoprazole Sodium] Rash    SOCIAL HISTORY:  Social History  Substance Use Topics  . Smoking status: Never Smoker  . Smokeless tobacco: Never Used  . Alcohol use No    FAMILY HISTORY: Family History  Problem Relation Age of Onset  . Heart disease Mother     Heart Disease before age 86  . Hyperlipidemia Mother   . Hypertension Mother   . Diabetes Mother   . Heart attack Mother   . Heart disease Father     Heart Disease before age 74  . Hyperlipidemia Father   . Hypertension Father   . Diabetes Father   . Diabetes Sister   . Hypertension Sister   . Diabetes Brother   . Hyperlipidemia Brother   . Heart attack Brother   . Hypertension Sister   . Heart attack Brother   . Other      noncontributory for early CAD  . Colon cancer Neg Hx   . Esophageal cancer Neg Hx   . Liver disease Neg Hx   . Kidney disease Neg Hx   . Colon polyps Neg Hx     EXAM: BP (!) 202/71 (BP Location:  Right Arm)   Pulse 65   Temp 98.3 F (36.8 C) (Oral)   Resp 18   Ht 5\' 1"  (1.549 m)   Wt 133 lb (60.3 kg)   SpO2 97%   BMI 25.13 kg/m  CONSTITUTIONAL: Alert and oriented and responds appropriately to questions. Well-appearing; well-nourished, elderly, in no significant distress, afebrile and nontoxic HEAD: Normocephalic EYES: Conjunctivae clear, PERRL, extraocular movements intact ENT: normal nose; no rhinorrhea; moist mucous membranes NECK: Supple, no meningismus, no LAD, no nuchal rigidity  CARD: RRR; S1 and S2 appreciated; no murmurs, no clicks, no rubs, no gallops  RESP: Normal chest excursion without splinting or tachypnea; breath sounds clear and equal bilaterally; no wheezes, no rhonchi, no rales, no hypoxia or respiratory distress, speaking full sentences ABD/GI: Normal bowel sounds; non-distended; soft, non-tender, no rebound, no guarding, no peritoneal signs BACK:  The back appears normal and is non-tender to palpation, there is no CVA tenderness EXT: Normal ROM in all joints; non-tender to palpation; no edema; normal capillary refill; no cyanosis, no calf tenderness or swelling, AV fistula in the left upper extremity without erythema, warmth, tenderness to palpation. 2+ radial pulses bilaterally.    SKIN: Normal color for age and race; warm; no rash NEURO: Moves all extremities equally, sensation to light touch intact diffusely, cranial nerves II through XII intact, normal gait, no dysarthria or aphasia PSYCH: The patient's mood and manner are appropriate. Grooming and personal hygiene are appropriate.  MEDICAL DECISION MAKING: Patient here with headache that started at 7 PM. Has had similar headaches in the past. Describes as gradual onset, throbbing. She is hypertensive reports that she has large fluctuations in blood pressure. She has taken her hydralazine, labetalol and Isordil tonight but not her clonidine. Reports headaches have improved with Tylenol in the past. We'll give  her her home dose of clonidine to help with her hypertension tonight in a dose of Tylenol. Will obtain  a head CT from a suspicion for intracranial hemorrhage is low. Doubt stroke given she is neurologically intact. No fever, meningismus to suggest infectious etiology. She complains of abdominal discomfort but her abdominal exam is completely benign. No vomiting, diarrhea, vaginal bleeding or discharge. She no longer makes urine. Has not missed any dialysis does not appear volume overloaded. Is scheduled for dialysis tomorrow.  ED PROGRESS: Patient's labs are unremarkable. No leukocytosis. Potassium 5.3 without EKG changes. Elevated creatinine in the setting of end-stage renal disease. Head CT shows no acute abnormality, no hemorrhage. Given she is with a six-hour window upon her headache started I do not feel that this time she needs a lumbar puncture to rule out subarachnoid hemorrhage given my suspicion for this is very low.   12:45 AM  Pt's HA is completely gone after Tylenol.  BP is still 210s/90s which she reports is not uncommon for her at night.  She reports in the day time that she will have BPs sometimes of 150s/70s.  She reports compliance with her blood pressure medication and dialysis. We'll give her a dose of IV hydralazine here in the emergency department and continue to monitor patient. At this time I do not feel she needs to be admitted for hypertensive urgency, emergency.  1:45 AM  Pt's blood pressure is now 178/71 (see downtime documentation). She reports feeling well. Will discharge home. Have advised her to take her blood pressure medications as prescribed, monitor her blood pressure at home and discuss further management with her PCP. Advised her that she has a similar headache in the future she may take Tylenol at home. Discussed with her and her husband return precautions. They're comfortable with this plan.   At this time, I do not feel there is any life-threatening condition  present. I have reviewed and discussed all results (EKG, imaging, lab, urine as appropriate), exam findings with patient/family. I have reviewed nursing notes and appropriate previous records.  I feel the patient is safe to be discharged home without further emergent workup and can continue workup as an outpatient as needed. Discussed usual and customary return precautions. Patient/family verbalize understanding and are comfortable with this plan.  Outpatient follow-up has been provided. All questions have been answered.     EKG Interpretation  Date/Time:  Tuesday March 08 2016 23:26:04 EDT Ventricular Rate:  65 PR Interval:    QRS Duration: 104 QT Interval:  456 QTC Calculation: 475 R Axis:   13 Text Interpretation:  Sinus rhythm LVH with secondary repolarization abnormality Anterior Q waves, possibly due to LVH No significant change since last tracing Confirmed by Sabriya Yono,  DO, Daysia Vandenboom [phone]) on 03/08/2016 11:48:47 PM        I personally performed the services described in this documentation, which was scribed in my presence. The recorded information has been reviewed and is accurate.     [place], DO 03/09/16 [place], DO 03/09/16 [id number]

## 2016-03-09 DIAGNOSIS — N186 End stage renal disease: Secondary | ICD-10-CM | POA: Diagnosis not present

## 2016-03-09 DIAGNOSIS — I5032 Chronic diastolic (congestive) heart failure: Secondary | ICD-10-CM | POA: Diagnosis not present

## 2016-03-09 DIAGNOSIS — Z992 Dependence on renal dialysis: Secondary | ICD-10-CM | POA: Diagnosis not present

## 2016-03-09 DIAGNOSIS — Z7982 Long term (current) use of aspirin: Secondary | ICD-10-CM | POA: Diagnosis not present

## 2016-03-09 DIAGNOSIS — I2510 Atherosclerotic heart disease of native coronary artery without angina pectoris: Secondary | ICD-10-CM | POA: Diagnosis not present

## 2016-03-09 DIAGNOSIS — Z79899 Other long term (current) drug therapy: Secondary | ICD-10-CM | POA: Diagnosis not present

## 2016-03-09 DIAGNOSIS — R51 Headache: Secondary | ICD-10-CM | POA: Diagnosis not present

## 2016-03-09 DIAGNOSIS — Z85038 Personal history of other malignant neoplasm of large intestine: Secondary | ICD-10-CM | POA: Diagnosis not present

## 2016-03-09 DIAGNOSIS — I132 Hypertensive heart and chronic kidney disease with heart failure and with stage 5 chronic kidney disease, or end stage renal disease: Secondary | ICD-10-CM | POA: Diagnosis not present

## 2016-03-09 DIAGNOSIS — Z8543 Personal history of malignant neoplasm of ovary: Secondary | ICD-10-CM | POA: Diagnosis not present

## 2016-03-09 LAB — BASIC METABOLIC PANEL
Anion gap: 14 (ref 5–15)
BUN: 50 mg/dL — AB (ref 6–20)
CHLORIDE: 97 mmol/L — AB (ref 101–111)
CO2: 24 mmol/L (ref 22–32)
CREATININE: 9.88 mg/dL — AB (ref 0.44–1.00)
Calcium: 9.2 mg/dL (ref 8.9–10.3)
GFR, EST AFRICAN AMERICAN: 4 mL/min — AB (ref >60)
GFR, EST NON AFRICAN AMERICAN: 3 mL/min — AB (ref >60)
Glucose, Bld: 104 mg/dL — ABNORMAL HIGH (ref 65–99)
POTASSIUM: 5.3 mmol/L — AB (ref 3.5–5.1)
SODIUM: 135 mmol/L (ref 135–145)

## 2016-03-09 IMAGING — CT CT HEAD W/O CM
4 series · 16 of 47 positions shown, 18 images · non-contrast
Comparison: [DATE]

CLINICAL DATA: Headache on the top of her head beginning at [HP]
tonight. She states she's never had a headache like this before and
describes her pain as throbbing.

EXAM:
CT HEAD WITHOUT CONTRAST
TECHNIQUE: Contiguous axial images were obtained from the base of the skull
through the vertex without intravenous contrast.

[Series 2: head w/o · axial · non-contrast · 0.39mm/px · z∈[+88,+188]mm · 8 of 33 slices shown, 10 images]
[im 4/33  brain]
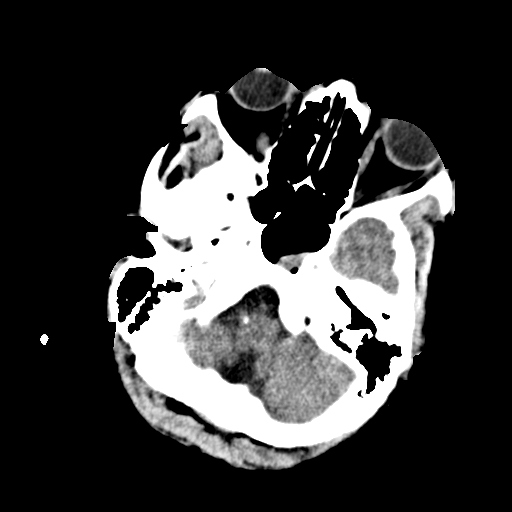
[im 4/33  bone]
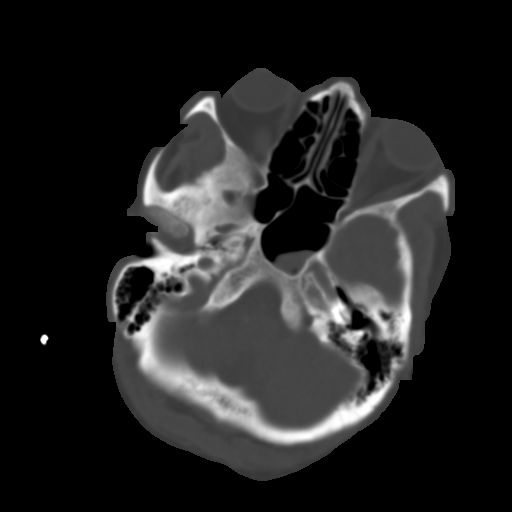
[im 8/33  brain]
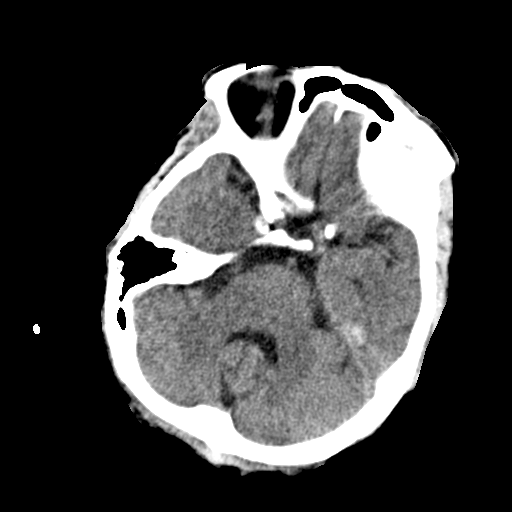
[im 11/33  brain]
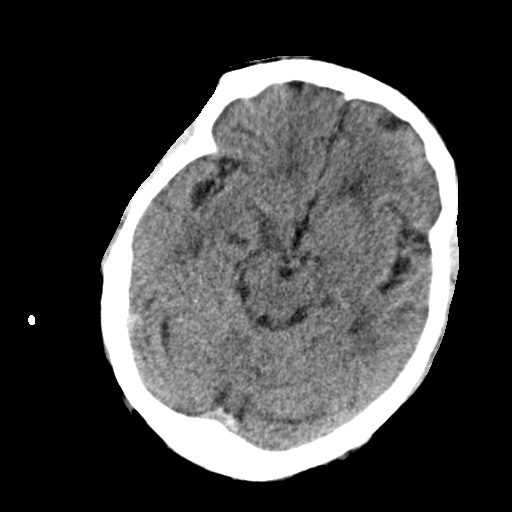
[im 15/33  brain]
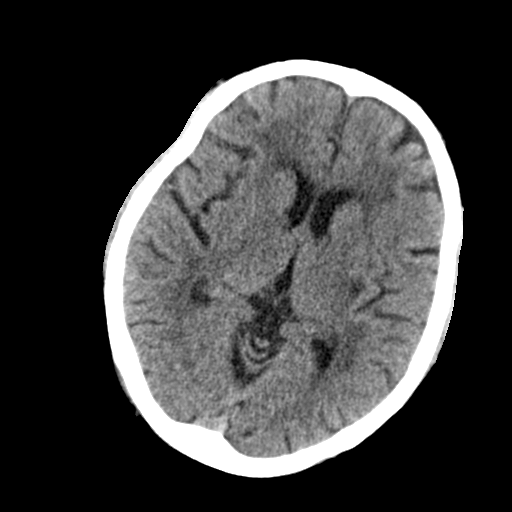
[im 18/33  brain]
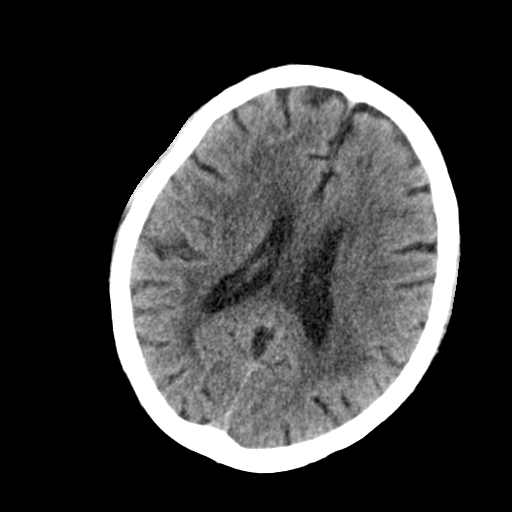
[im 18/33  bone]
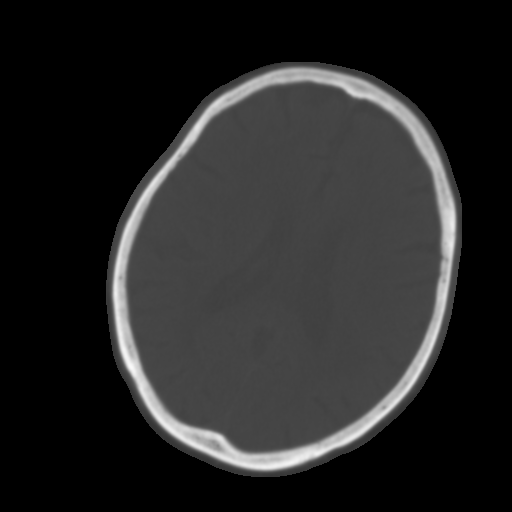
[im 22/33  brain]
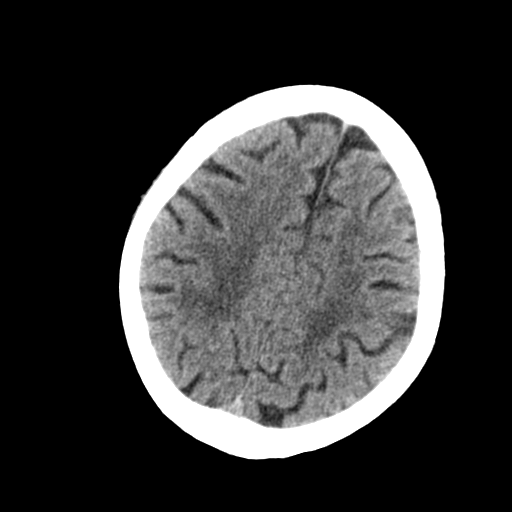
[im 25/33  brain]
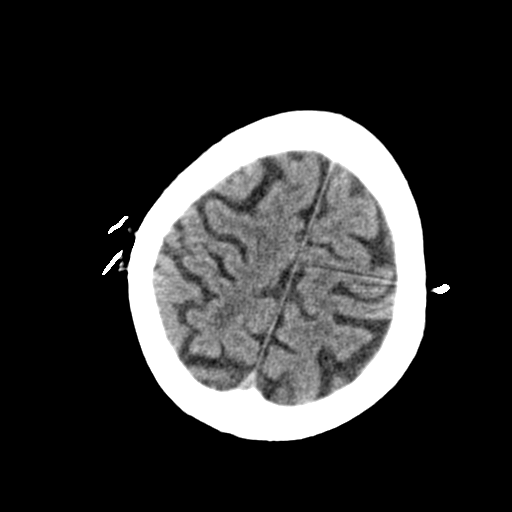
[im 29/33  brain]
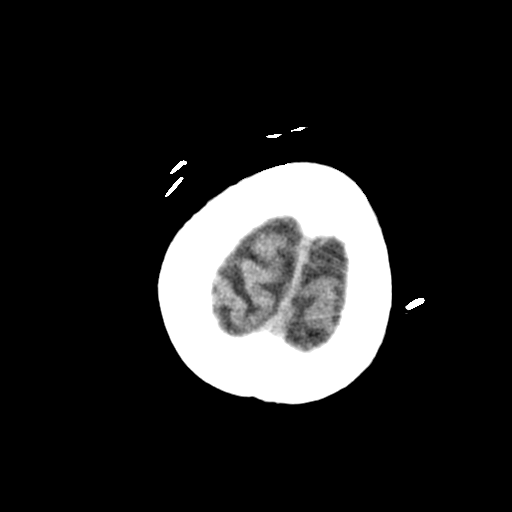

[Series 3: head bone · axial · 0.39mm/px · z∈[+88,+102]mm · 2 of 66 slices shown]
[im 7/66  bone]
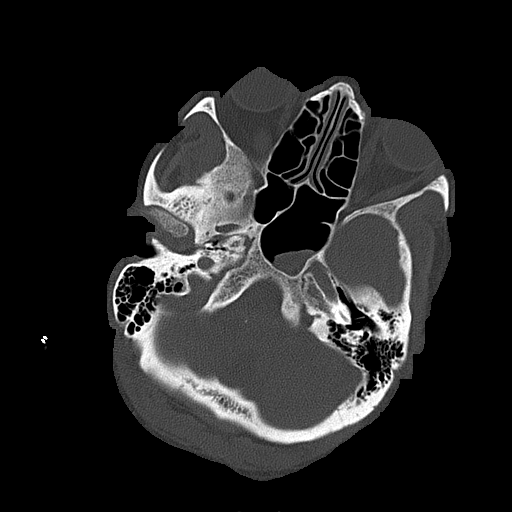
[im 14/66  bone]
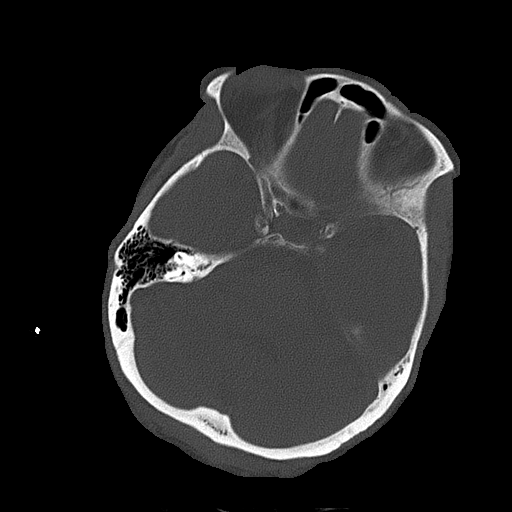

[Series 4: coronal · coronal · 0.27mm/px · 3 of 67 slices shown]
[im 23/67  brain]
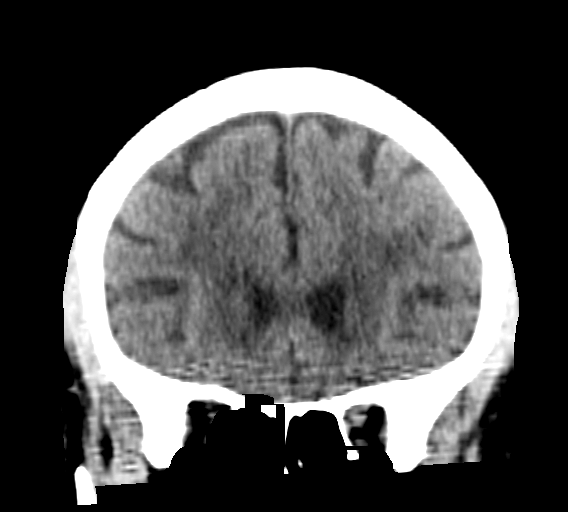
[im 30/67  brain]
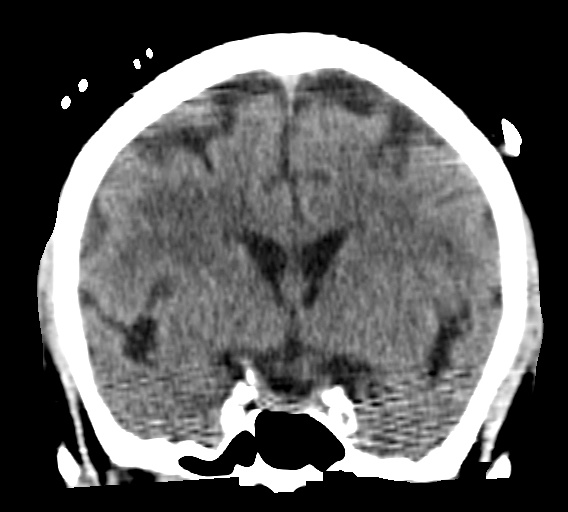
[im 37/67  brain]
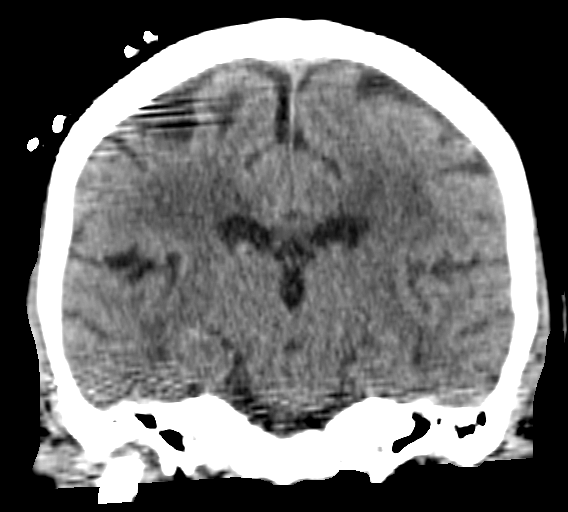

[Series 5: sagittal · sagittal · 0.28mm/px · 3 of 47 slices shown]
[im 16/47  brain]
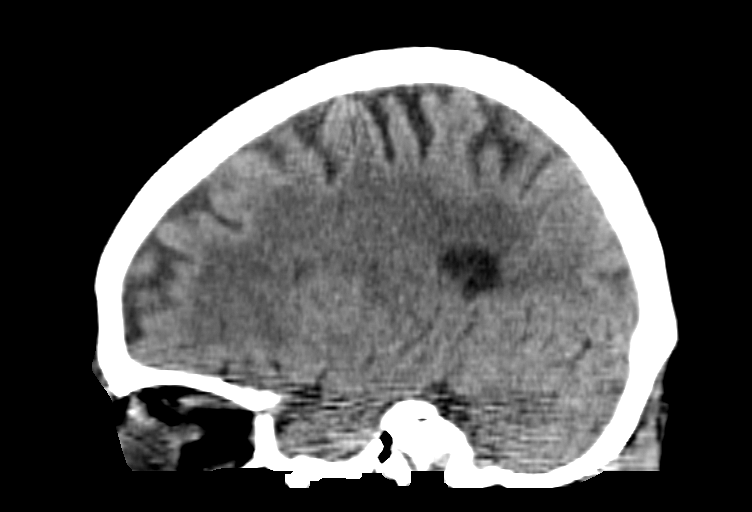
[im 24/47  brain]
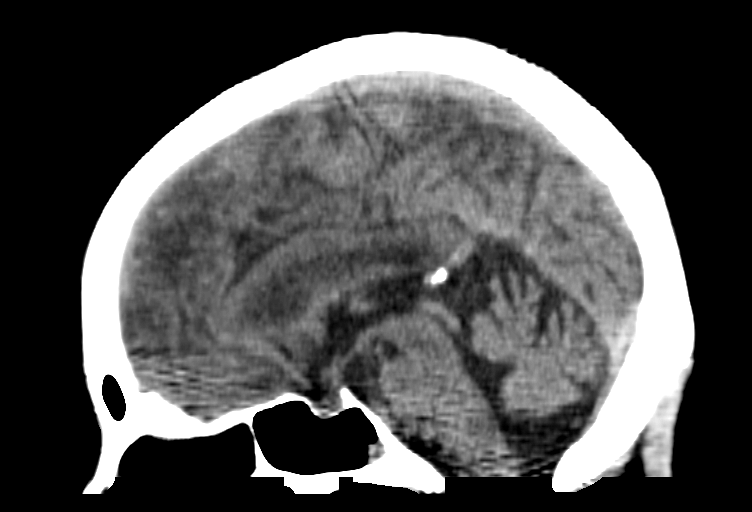
[im 31/47  brain]
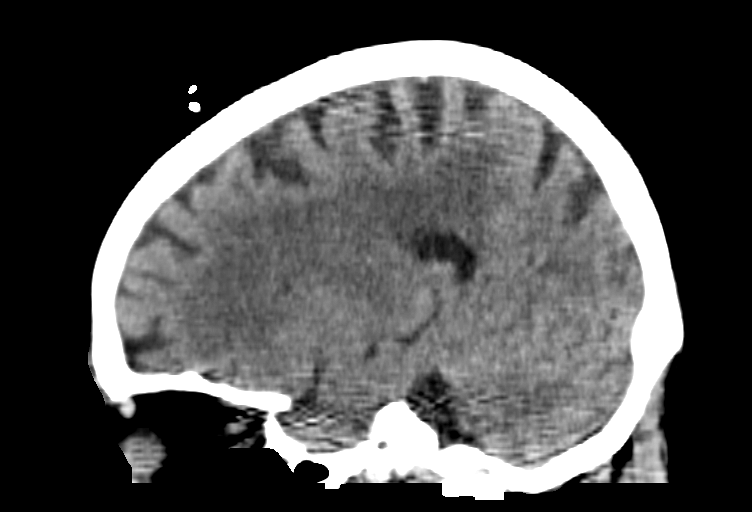

[16 of 47 positions shown; findings below may reference images not displayed]

FINDINGS: Brain: Mild cerebral atrophy. No ventricular dilatation. Diffuse low
attenuation change throughout the deep white matter consistent with
small vessel ischemia. No mass effect or midline shift. No abnormal
extra-axial fluid collections. Gray-white matter junctions are
distinct. Basal cisterns are not effaced. No evidence of acute
intracranial hemorrhage.

Vascular: Vascular calcifications in the intracranial carotid
arteries.

Skull: No depressed skull fractures.

Sinuses/Orbits: Retention cysts in the sphenoid sinus. No acute
air-fluid levels in the paranasal sinuses. Mastoid air cells are not
opacified.

Other: No significant changes since prior study.
IMPRESSION: No acute intracranial abnormalities. Chronic atrophy and small
vessel ischemic changes.

## 2016-03-09 MED ORDER — HYDRALAZINE HCL 20 MG/ML IJ SOLN
10.0000 mg | Freq: Once | INTRAMUSCULAR | Status: AC
  Administered 2016-03-09: 10 mg via INTRAVENOUS
  Filled 2016-03-09: qty 1

## 2016-03-09 NOTE — ED Notes (Signed)
Patient was discharge during downtime, unable to obtain electronic signature, obtained paper signature.

## 2016-03-10 DIAGNOSIS — N186 End stage renal disease: Secondary | ICD-10-CM | POA: Diagnosis not present

## 2016-03-10 DIAGNOSIS — Z992 Dependence on renal dialysis: Secondary | ICD-10-CM | POA: Diagnosis not present

## 2016-03-11 DIAGNOSIS — Z23 Encounter for immunization: Secondary | ICD-10-CM | POA: Diagnosis not present

## 2016-03-11 DIAGNOSIS — Z992 Dependence on renal dialysis: Secondary | ICD-10-CM | POA: Diagnosis not present

## 2016-03-11 DIAGNOSIS — N186 End stage renal disease: Secondary | ICD-10-CM | POA: Diagnosis not present

## 2016-03-12 DIAGNOSIS — Z6826 Body mass index (BMI) 26.0-26.9, adult: Secondary | ICD-10-CM | POA: Diagnosis not present

## 2016-03-12 DIAGNOSIS — R1011 Right upper quadrant pain: Secondary | ICD-10-CM | POA: Diagnosis not present

## 2016-03-14 DIAGNOSIS — Z992 Dependence on renal dialysis: Secondary | ICD-10-CM | POA: Diagnosis not present

## 2016-03-14 DIAGNOSIS — N186 End stage renal disease: Secondary | ICD-10-CM | POA: Diagnosis not present

## 2016-03-14 DIAGNOSIS — Z23 Encounter for immunization: Secondary | ICD-10-CM | POA: Diagnosis not present

## 2016-03-16 DIAGNOSIS — N186 End stage renal disease: Secondary | ICD-10-CM | POA: Diagnosis not present

## 2016-03-16 DIAGNOSIS — Z992 Dependence on renal dialysis: Secondary | ICD-10-CM | POA: Diagnosis not present

## 2016-03-16 DIAGNOSIS — Z23 Encounter for immunization: Secondary | ICD-10-CM | POA: Diagnosis not present

## 2016-03-18 DIAGNOSIS — Z23 Encounter for immunization: Secondary | ICD-10-CM | POA: Diagnosis not present

## 2016-03-18 DIAGNOSIS — Z992 Dependence on renal dialysis: Secondary | ICD-10-CM | POA: Diagnosis not present

## 2016-03-18 DIAGNOSIS — N186 End stage renal disease: Secondary | ICD-10-CM | POA: Diagnosis not present

## 2016-03-20 DIAGNOSIS — Z992 Dependence on renal dialysis: Secondary | ICD-10-CM | POA: Diagnosis not present

## 2016-03-20 DIAGNOSIS — N186 End stage renal disease: Secondary | ICD-10-CM | POA: Diagnosis not present

## 2016-03-20 DIAGNOSIS — Z23 Encounter for immunization: Secondary | ICD-10-CM | POA: Diagnosis not present

## 2016-03-21 DIAGNOSIS — Q828 Other specified congenital malformations of skin: Secondary | ICD-10-CM | POA: Diagnosis not present

## 2016-03-21 DIAGNOSIS — M79671 Pain in right foot: Secondary | ICD-10-CM | POA: Diagnosis not present

## 2016-03-21 DIAGNOSIS — M25579 Pain in unspecified ankle and joints of unspecified foot: Secondary | ICD-10-CM | POA: Diagnosis not present

## 2016-03-21 DIAGNOSIS — M79672 Pain in left foot: Secondary | ICD-10-CM | POA: Diagnosis not present

## 2016-03-22 DIAGNOSIS — N281 Cyst of kidney, acquired: Secondary | ICD-10-CM | POA: Diagnosis not present

## 2016-03-22 DIAGNOSIS — R935 Abnormal findings on diagnostic imaging of other abdominal regions, including retroperitoneum: Secondary | ICD-10-CM | POA: Diagnosis not present

## 2016-03-22 DIAGNOSIS — R1011 Right upper quadrant pain: Secondary | ICD-10-CM | POA: Diagnosis not present

## 2016-03-22 DIAGNOSIS — R112 Nausea with vomiting, unspecified: Secondary | ICD-10-CM | POA: Diagnosis not present

## 2016-03-22 DIAGNOSIS — I700 Atherosclerosis of aorta: Secondary | ICD-10-CM | POA: Diagnosis not present

## 2016-03-23 ENCOUNTER — Encounter (HOSPITAL_COMMUNITY): Payer: Self-pay | Admitting: Emergency Medicine

## 2016-03-23 ENCOUNTER — Telehealth: Payer: Self-pay | Admitting: Cardiovascular Disease

## 2016-03-23 ENCOUNTER — Emergency Department (HOSPITAL_COMMUNITY)
Admission: EM | Admit: 2016-03-23 | Discharge: 2016-03-23 | Disposition: A | Discharge status: Home / Self Care | Payer: Medicare HMO | Attending: Emergency Medicine | Admitting: Emergency Medicine

## 2016-03-23 DIAGNOSIS — I2510 Atherosclerotic heart disease of native coronary artery without angina pectoris: Secondary | ICD-10-CM | POA: Diagnosis not present

## 2016-03-23 DIAGNOSIS — Z992 Dependence on renal dialysis: Secondary | ICD-10-CM | POA: Insufficient documentation

## 2016-03-23 DIAGNOSIS — I159 Secondary hypertension, unspecified: Secondary | ICD-10-CM | POA: Diagnosis not present

## 2016-03-23 DIAGNOSIS — N186 End stage renal disease: Secondary | ICD-10-CM | POA: Diagnosis not present

## 2016-03-23 DIAGNOSIS — Z7982 Long term (current) use of aspirin: Secondary | ICD-10-CM | POA: Insufficient documentation

## 2016-03-23 DIAGNOSIS — Z8543 Personal history of malignant neoplasm of ovary: Secondary | ICD-10-CM | POA: Insufficient documentation

## 2016-03-23 DIAGNOSIS — Z79899 Other long term (current) drug therapy: Secondary | ICD-10-CM | POA: Insufficient documentation

## 2016-03-23 DIAGNOSIS — I132 Hypertensive heart and chronic kidney disease with heart failure and with stage 5 chronic kidney disease, or end stage renal disease: Secondary | ICD-10-CM | POA: Diagnosis present

## 2016-03-23 DIAGNOSIS — Z85038 Personal history of other malignant neoplasm of large intestine: Secondary | ICD-10-CM | POA: Diagnosis not present

## 2016-03-23 DIAGNOSIS — I5033 Acute on chronic diastolic (congestive) heart failure: Secondary | ICD-10-CM | POA: Diagnosis not present

## 2016-03-23 DIAGNOSIS — Z23 Encounter for immunization: Secondary | ICD-10-CM | POA: Diagnosis not present

## 2016-03-23 NOTE — Telephone Encounter (Signed)
Elevated BP during dialysis today and per patient this was not discussed with nephrologist. Patient said she was advised to contact her doctor. Patient c/o dizziness this morning before dialysis that lasted about 15 minutes, but was able to walk without assistance. Patient said that she has not been dizzy since that time. No c/o chest pain or sob. Patient did contact her PCP and was advised to contact her cardiologist. Patient said when she left dialysis today her BP was 221/104. Patient said the nurse had her sit there for awhile and her BP 224/102. Patient is unaware of what her HR was during that time. Patient advised to check her BP at home if possible using the same cuff, same arm and same time of the day when she takes it. Patient advised that she needed an office visit but she would be contacted with a plan after message is sent to her doctor with a his response. Patient verbalized understanding.

## 2016-03-23 NOTE — ED Triage Notes (Signed)
Pt reports elevated BP today. Systolic > 548 during dialysis today.

## 2016-03-23 NOTE — ED Provider Notes (Signed)
[facility] DEPT Provider Note   CSN: [id number] Arrival date & time: 03/23/16  1853  By signing my name below, I, Ephriam Jenkins, attest that this documentation has been prepared under the direction and in the presence of No att. providers found. Electronically signed, Ephriam Jenkins, ED Scribe. 03/23/16. 10:26 PM.  History   Chief Complaint Chief Complaint  Patient presents with  . Hypertension    HPI HPI Comments: Shawna [name] is a 75 y.o. Female with a PMHx of CAD, ESRD, who presents to the Emergency Department for hypertension that started earlier today during dialysis. Pt was 30 minutes into dialysis and reports that her BP started to become higher, 409 systolic. Her BP normally runs 811-914 systolic. She went to dialysis on Sunday and today, which is one extra day than she normally does. Pt is prescribed labetalol, isosorbide, clonidine and states she has been taking them as she usually does, did not miss a dose this morning. No headache, blurry vision, chest pain, shortness of breath. Dr. Nadara Mustard is her PCP in [place].      Past Medical History:  Diagnosis Date  . Anxiety   . Arthritis   . AVM (arteriovenous malformation) of colon   . CAD (coronary artery disease)    a. 12/2011 NSTEMI/Cath/PCI LCX (2.25x14 Resolute DES) & D1 (2.25x22 Resolute DES);  b. 01/2012 Cath/PCI: LM 30, LAD 30p, 40-7m, D1 stent ok, 99 in sm branch of diag, LCX patent stent, OM1 20, RCA 95 ost (4.0x12 Promus DES), EF 55%;  c. 04/2012 Lexi Cardiolite  EF 48%, small area of scar @ base/mid inflat wall with mild peri-infarct ischemia.; CABG 12/4  . Carotid artery disease ([place])    a. 78-29% LICA, 11/6211   . Chronic bronchitis ([place])   . Chronic diastolic CHF (congestive heart failure) ([place])    a. 02/2012 Echo EF 60-65%, nl wall motion, Gr 1 DD, mod MR  . Colon cancer ([place]) 1992  . Esophageal stricture   . ESRD on hemodialysis ([place])    ESRD due to HTN, started dialysis 2011 and gets HD at [facility] with Dr  Hinda Lenis on MWF schedule.  Access is LUA AVF as of Sept 2014.   Marland Kitchen GERD (gastroesophageal reflux disease)   . High cholesterol 12/2011  . History of blood transfusion 07/2011; 12/2011; 01/2012 X 2; 04/2012  . History of gout   . History of lower GI bleeding   . Hypertension   . Iron deficiency anemia   . Mitral regurgitation    a. Moderate by echo, 02/2012  . Myocardial infarction ([place])   . Ovarian cancer ([place]) 1992  . Pneumonia ~ 2009  . PUD (peptic ulcer disease)   . TIA (transient ischemic attack)     Patient Active Problem List   Diagnosis Date Noted  . Hypertensive crisis 01/27/2016  . History of colon cancer 01/27/2016  . History of ovarian cancer 01/27/2016  . Hypertensive urgency 01/27/2016  . Narrow complex tachycardia ([place]) 09/08/2015  . SVT (supraventricular tachycardia) ([place]) 09/08/2015  . Influenza A 08/30/2015  . Acute on chronic diastolic CHF (congestive heart failure) ([place]) 05/04/2015  . Unstable angina ([place]) 05/03/2015  . Essential hypertension   . Pain in joint, lower leg 08/14/2014  . Chest pain 11/26/2013  . Small bowel obstruction, partial ([place]) 05/29/2013  . Chronic diastolic CHF (congestive heart failure) ([place]) 03/22/2013  . GI bleeding 03/21/2013  . Acute blood loss anemia 03/21/2013  . Vaginal odor 03/12/2013  . Vaginal discharge 03/12/2013  .  Occlusion and stenosis of carotid artery without mention of cerebral infarction 01/24/2013  . Hx of CABG 07/05/2012  . Carotid artery disease ([place]) 07/05/2012  . Anemia of chronic kidney failure 07/03/2012  . Secondary hyperparathyroidism ([place]) 07/03/2012  . Mitral regurgitation 06/12/2012  . Pneumonia 06/09/2012  . Non-STEMI (non-ST elevated myocardial infarction) ([place]) 06/08/2012  . GERD (gastroesophageal reflux disease) 01/09/2012  . HLD (hyperlipidemia) 01/05/2012  . Coronary atherosclerosis of native coronary artery 12/16/2011  . Essential hypertension, benign 12/16/2011  . ESRD on hemodialysis ([place])  12/16/2011    Past Surgical History:  Procedure Laterality Date  . ABDOMINAL HYSTERECTOMY  1992  . APPENDECTOMY  06/1990  . AV FISTULA PLACEMENT  07/2009   left upper arm  . COLON RESECTION  1992  . CORONARY ANGIOPLASTY WITH STENT PLACEMENT  12/15/11   "2"  . CORONARY ANGIOPLASTY WITH STENT PLACEMENT  y/2013   "1; makes total of 3" (05/02/2012)  . CORONARY ARTERY BYPASS GRAFT  06/13/2012   Procedure: CORONARY ARTERY BYPASS GRAFTING (CABG);  Surgeon: Grace Isaac, MD;  Location: [facility];  Service: Open Heart Surgery;  Laterality: N/A;  cabg x four;  using left internal mammary artery, and left leg greater saphenous vein harvested endoscopically  . DILATION AND CURETTAGE OF UTERUS    . ESOPHAGOGASTRODUODENOSCOPY  01/20/2012   Procedure: ESOPHAGOGASTRODUODENOSCOPY (EGD);  Surgeon: Ladene Artist, MD,FACG;  Location[facility] ENDOSCOPY;  Service: Endoscopy;  Laterality: N/A;  . ESOPHAGOGASTRODUODENOSCOPY N/A 03/26/2013   Procedure: ESOPHAGOGASTRODUODENOSCOPY (EGD);  Surgeon: Irene Shipper, MD;  Location[facility] ENDOSCOPY;  Service: Endoscopy;  Laterality: N/A;  . ESOPHAGOGASTRODUODENOSCOPY N/A 04/30/2015   Procedure: ESOPHAGOGASTRODUODENOSCOPY (EGD);  Surgeon: Rogene Houston, MD;  [facility]O [facility];  Service: Endoscopy;  Laterality: N/A;  1pm - moved to 10/20 @ 1:10  . INTRAOPERATIVE TRANSESOPHAGEAL ECHOCARDIOGRAM  06/13/2012   Procedure: INTRAOPERATIVE TRANSESOPHAGEAL ECHOCARDIOGRAM;  Surgeon: Grace Isaac, MD;  Location: [facility];  Service: Open Heart Surgery;  Laterality: N/A;  . LEFT HEART CATHETERIZATION WITH CORONARY ANGIOGRAM N/A 12/15/2011   Procedure: LEFT HEART CATHETERIZATION WITH CORONARY ANGIOGRAM;  Surgeon: Burnell Blanks, MD;  Location: [facility] CATH LAB;  Service: Cardiovascular;  Laterality: N/A;  . LEFT HEART CATHETERIZATION WITH CORONARY ANGIOGRAM N/A 01/10/2012   Procedure: LEFT HEART CATHETERIZATION WITH CORONARY ANGIOGRAM;  Surgeon: Peter M Martinique, MD;  Location: [facility] CATH LAB;   Service: Cardiovascular;  Laterality: N/A;  . LEFT HEART CATHETERIZATION WITH CORONARY ANGIOGRAM N/A 06/08/2012   Procedure: LEFT HEART CATHETERIZATION WITH CORONARY ANGIOGRAM;  Surgeon: Burnell Blanks, MD;  Location: [facility] CATH LAB;  Service: Cardiovascular;  Laterality: N/A;  . LEFT HEART CATHETERIZATION WITH CORONARY/GRAFT ANGIOGRAM N/A 12/10/2013   Procedure: LEFT HEART CATHETERIZATION WITH Beatrix Fetters;  Surgeon: [name], MD;  Location: [facility] CATH LAB;  Service: Cardiovascular;  Laterality: N/A;  . OVARY SURGERY     ovarian cancer  . SHUNTOGRAM N/A 10/15/2013   Procedure: Fistulogram;  Surgeon: Serafina Mitchell, MD;  Location: [facility] CATH LAB;  Service: Cardiovascular;  Laterality: N/A;  . THROMBECTOMY / ARTERIOVENOUS GRAFT REVISION  2011   left upper arm  . TUBAL LIGATION  1980's    OB History    Gravida Para Term Preterm AB Living   2 2   2   2    SAB TAB Ectopic Multiple Live Births                   Home Medications    Prior to Admission medications  Medication Sig Start Date End Date Taking? Authorizing Provider  ALPRAZolam Duanne Moron) 0.25 MG tablet Take 1-2 tablets by mouth at bedtime as needed for sleep.  11/19/15   Historical Provider, MD  amiodarone (PACERONE) 200 MG tablet Take 1 tablet (200 mg total) by mouth daily. 09/23/15   Herminio Commons, MD  aspirin EC 81 MG tablet Take 81 mg by mouth daily.     Historical Provider, MD  bacitracin ophthalmic ointment  12/08/15   Historical Provider, MD  cloNIDine (CATAPRES) 0.3 MG tablet Take 1 tablet (0.3 mg total) by mouth daily. *May take an additional tablet based on blood pressure levels* 01/28/16   Rexene Alberts, MD  fluticasone [facility]) 50 MCG/ACT nasal spray Place 2 sprays into the nose daily.     Historical Provider, MD  folic acid-vitamin b complex-vitamin c-selenium-zinc (DIALYVITE) 3 MG TABS Take 1 tablet by mouth daily.    Historical Provider, MD  hydrALAZINE (APRESOLINE) 50 MG tablet Take 3 tablets  (150 mg total) by mouth 2 (two) times daily. 01/28/16   Rexene Alberts, MD  isosorbide dinitrate (ISORDIL) 30 MG tablet Take 1 tablet (30 mg total) by mouth 3 (three) times daily. 07/28/15   Herminio Commons, MD  labetalol (NORMODYNE) 200 MG tablet Take 1.5 tablets (300 mg total) by mouth 2 (two) times daily. 06/17/15   Herminio Commons, MD  lanthanum (FOSRENOL) 1000 MG chewable tablet Chew 1,000 mg by mouth 3 (three) times daily after meals.    Historical Provider, MD  NITROSTAT 0.4 MG SL tablet DISSOLVE ONE TABLET UNDER THE TONGUE EVERY 5 MINUTES AS NEEDED FOR CHEST PAIN.  DO NOT EXCEED A TOTAL OF 3 DOSES IN 15 MINUTES AS NEEDED 01/20/16   Herminio Commons, MD  omeprazole (PRILOSEC) 20 MG capsule Take 1 capsule (20 mg total) by mouth daily. 05/12/15   Butch Penny, NP  ondansetron (ZOFRAN-ODT) 4 MG disintegrating tablet Take 4 mg by mouth every 8 (eight) hours as needed for nausea or vomiting. nausea 05/18/15   Historical Provider, MD  PROAIR HFA 108 (90 BASE) MCG/ACT inhaler Inhale 1 puff into the lungs every 6 (six) hours as needed for wheezing or shortness of breath.  01/09/15   Historical Provider, MD  SENSIPAR 30 MG tablet Take 30 mg by mouth daily with supper.  12/21/12   Historical Provider, MD  simvastatin (ZOCOR) 20 MG tablet TAKE ONE TABLET BY MOUTH AT BEDTIME 07/22/15   Herminio Commons, MD    Family History Family History  Problem Relation Age of Onset  . Heart disease Mother     Heart Disease before age 33  . Hyperlipidemia Mother   . Hypertension Mother   . Diabetes Mother   . Heart attack Mother   . Heart disease Father     Heart Disease before age 91  . Hyperlipidemia Father   . Hypertension Father   . Diabetes Father   . Diabetes Sister   . Hypertension Sister   . Diabetes Brother   . Hyperlipidemia Brother   . Heart attack Brother   . Hypertension Sister   . Heart attack Brother   . Other      noncontributory for early CAD  . Colon cancer Neg Hx   .  Esophageal cancer Neg Hx   . Liver disease Neg Hx   . Kidney disease Neg Hx   . Colon polyps Neg Hx     Social History Social History  Substance Use Topics  . Smoking  status: Never Smoker  . Smokeless tobacco: Never Used  . Alcohol use No     Allergies   Aspirin; Contrast media [iodinated diagnostic agents]; Iron; Penicillins; Plavix [clopidogrel bisulfate]; Amlodipine; Bactrim [sulfamethoxazole-trimethoprim]; Dexilant [dexlansoprazole]; Morphine and related; Nitrofurantoin; Prilosec [omeprazole]; Venofer [ferric oxide]; Levaquin [levofloxacin in d5w]; and Protonix [pantoprazole sodium]   Review of Systems Review of Systems  Eyes: Negative for visual disturbance.  Respiratory: Negative for shortness of breath.   Cardiovascular: Negative for chest pain.  Neurological: Negative for headaches.  All other systems reviewed and are negative.    Physical Exam Updated Vital Signs BP (!) 207/68 (BP Location: Right Arm)   Pulse 65   Temp 98.9 F (37.2 C) (Oral)   Resp 18   Ht 5\' 1"  (1.549 m)   Wt 133 lb (60.3 kg)   SpO2 98%   BMI 25.13 kg/m   Physical Exam  Constitutional: She is oriented to person, place, and time. She appears well-developed and well-nourished. No distress.  HENT:  Head: Normocephalic and atraumatic.  Nose: Nose normal.  Eyes: Conjunctivae and EOM are normal. Pupils are equal, round, and reactive to light. Right eye exhibits no discharge. Left eye exhibits no discharge. No scleral icterus.  Neck: Normal range of motion. Neck supple. No JVD present.  Cardiovascular: Normal rate and regular rhythm.  Exam reveals no gallop and no friction rub.   No murmur heard. Pulmonary/Chest: Effort normal and breath sounds normal. No stridor. No respiratory distress. She has no rales.  Abdominal: Soft. She exhibits no distension. There is no tenderness.  Musculoskeletal: She exhibits no edema or tenderness.  No swelling in the legs  Neurological: She is alert and  oriented to person, place, and time.  Mental Status: Alert and oriented to person, place, and time. Attention and concentration normal. Speech clear. Recent memory is intac  Cranial Nerves  II Visual Fields: Intact to confrontation. Visual fields intact. III, IV, VI: Pupils equal and reactive to light and near. Full eye movement without nystagmus  V Facial Sensation: Normal. No weakness of masticatory muscles  VII: No facial weakness or asymmetry  VIII Auditory Acuity: Grossly normal  IX/X: The uvula is midline; the palate elevates symmetrically  XI: Normal sternocleidomastoid and trapezius strength  XII: The tongue is midline. No atrophy or fasciculations.   Motor System: Muscle Strength: 5/5 and symmetric in the upper and lower extremities. No pronation or drift.  Muscle Tone: Tone and muscle bulk are normal in the upper and lower extremities.   Reflexes: DTRs: 2+ and symmetrical in all four extremities. Plantar responses are flexor bilaterally.  Coordination: Intact finger-to-nose. No tremor.  Sensation: Intact to light touch, and pinprick.  Gait: Routine gait normal    Skin: Skin is warm and dry. No rash noted. She is not diaphoretic. No erythema.  Psychiatric: She has a normal mood and affect.  Vitals reviewed.   ED Treatments / Results  DIAGNOSTIC STUDIES: Oxygen Saturation is 96% on RA, normal by my interpretation.  COORDINATION OF CARE: 10:17 PM-Will discharge. Discussed treatment plan with pt at bedside and pt agreed to plan.   Labs (all labs ordered are listed, but only abnormal results are displayed) Labs Reviewed - No data to display  EKG  EKG Interpretation  Date/Time:  Wednesday March 23 2016 21:13:09 EDT Ventricular Rate:  68 PR Interval:    QRS Duration: 101 QT Interval:  450 QTC Calculation: 479 R Axis:   55 Text Interpretation:  Sinus rhythm Probable left atrial enlargement  LVH with secondary repolarization abnormality Confirmed by [facility] MD, [place]  ([id number]) on 03/24/2016 1:32:58 AM       Radiology No results found.  Procedures Procedures (including critical care time)  Medications Ordered in ED Medications - No data to display   Initial Impression / Assessment and Plan / ED Course  I have reviewed the triage vital signs and the nursing notes.  Pertinent labs & imaging results that were available during my care of the patient were reviewed by me and considered in my medical decision making (see chart for details).  Clinical Course    Asymptomatic hypertension. No focal deficits on exam. No indication for labs at this time. EKG without acute changes.  Patient is appropriate for discharge with close follow-up with her primary care provider for continued management of her chronic hypertension.  Final Clinical Impressions(s) / ED Diagnoses   Final diagnoses:  Secondary hypertension, unspecified   Disposition: Discharge  Condition: Good  I have discussed the results, Dx and Tx plan with the patient who expressed understanding and agree(s) with the plan. Discharge instructions discussed at great length. The patient was given strict return precautions who verbalized understanding of the instructions. No further questions at time of discharge.    Discharge Medication List as of 03/23/2016 10:51 PM      Follow Up: [name], MD [place] [address] [phone]  Schedule an appointment as soon as possible for a visit in 1 day For close follow up to assess blood pressure medicine management   I personally performed the services described in this documentation, which was scribed in my presence. The recorded information has been reviewed and is accurate.        Fatima Blank, MD 03/24/16 [phone]

## 2016-03-23 NOTE — Telephone Encounter (Signed)
Patient came in after her dialysis treatment to discuss her high BP readings today while on dialysis. Pre-dailysis  202/91       BP standing 169/88      After dialysis  216/97      BP Standing 199/80

## 2016-03-23 NOTE — Telephone Encounter (Signed)
Patient informed and verbalized understanding of plan. 

## 2016-03-23 NOTE — Telephone Encounter (Signed)
If SBP still over 200, yes, send to ED.

## 2016-03-23 NOTE — ED Notes (Signed)
Patient verbalizes understanding of discharge instructions, home care and follow up care. Patient out of department at this time. 

## 2016-03-25 ENCOUNTER — Telehealth: Payer: Self-pay | Admitting: Cardiovascular Disease

## 2016-03-25 DIAGNOSIS — N186 End stage renal disease: Secondary | ICD-10-CM | POA: Diagnosis not present

## 2016-03-25 DIAGNOSIS — Z23 Encounter for immunization: Secondary | ICD-10-CM | POA: Diagnosis not present

## 2016-03-25 DIAGNOSIS — Z992 Dependence on renal dialysis: Secondary | ICD-10-CM | POA: Diagnosis not present

## 2016-03-25 NOTE — Telephone Encounter (Signed)
[name] called stating that she went to the ER on 9-13 for elevated blood pressure.  Patient wants to know if Dr. Bronson Ing needs to adjust her BP medication. Please call cell #

## 2016-03-25 NOTE — Telephone Encounter (Signed)
Patient notified via voice mail that per ED note, they suggest follow up with PMD -   Follow Up: [name], MD [place] [id number] [phone]  Schedule an appointment as soon as possible for a visit in 1 day For close follow up to assess blood pressure medicine management  ===================================================  Advised to keep OV already scheduled for 04/13/2016 with Dr. Bronson Ing.

## 2016-03-28 DIAGNOSIS — N186 End stage renal disease: Secondary | ICD-10-CM | POA: Diagnosis not present

## 2016-03-28 DIAGNOSIS — Z992 Dependence on renal dialysis: Secondary | ICD-10-CM | POA: Diagnosis not present

## 2016-03-28 DIAGNOSIS — Z23 Encounter for immunization: Secondary | ICD-10-CM | POA: Diagnosis not present

## 2016-03-30 DIAGNOSIS — N186 End stage renal disease: Secondary | ICD-10-CM | POA: Diagnosis not present

## 2016-03-30 DIAGNOSIS — Z23 Encounter for immunization: Secondary | ICD-10-CM | POA: Diagnosis not present

## 2016-03-30 DIAGNOSIS — Z992 Dependence on renal dialysis: Secondary | ICD-10-CM | POA: Diagnosis not present

## 2016-04-01 DIAGNOSIS — Z23 Encounter for immunization: Secondary | ICD-10-CM | POA: Diagnosis not present

## 2016-04-01 DIAGNOSIS — N186 End stage renal disease: Secondary | ICD-10-CM | POA: Diagnosis not present

## 2016-04-01 DIAGNOSIS — Z992 Dependence on renal dialysis: Secondary | ICD-10-CM | POA: Diagnosis not present

## 2016-04-04 ENCOUNTER — Other Ambulatory Visit: Payer: Self-pay

## 2016-04-04 DIAGNOSIS — N186 End stage renal disease: Secondary | ICD-10-CM | POA: Diagnosis not present

## 2016-04-04 DIAGNOSIS — Z992 Dependence on renal dialysis: Secondary | ICD-10-CM | POA: Diagnosis not present

## 2016-04-04 DIAGNOSIS — Z79899 Other long term (current) drug therapy: Secondary | ICD-10-CM | POA: Diagnosis not present

## 2016-04-04 DIAGNOSIS — E785 Hyperlipidemia, unspecified: Secondary | ICD-10-CM | POA: Diagnosis not present

## 2016-04-04 DIAGNOSIS — Z23 Encounter for immunization: Secondary | ICD-10-CM | POA: Diagnosis not present

## 2016-04-04 MED ORDER — SIMVASTATIN 20 MG PO TABS: 20.0000 mg | ORAL_TABLET | Freq: Every day | ORAL | 3 refills | Status: DC

## 2016-04-06 ENCOUNTER — Observation Stay (HOSPITAL_COMMUNITY): Payer: Medicare HMO

## 2016-04-06 ENCOUNTER — Encounter (HOSPITAL_COMMUNITY): Payer: Self-pay | Admitting: *Deleted

## 2016-04-06 ENCOUNTER — Emergency Department (HOSPITAL_COMMUNITY): Payer: Medicare HMO

## 2016-04-06 ENCOUNTER — Inpatient Hospital Stay (HOSPITAL_COMMUNITY)
Admission: EM | Admit: 2016-04-06 | Discharge: 2016-04-08 | DRG: 208 | Disposition: A | Discharge status: Home / Self Care | Payer: Medicare HMO | Attending: Internal Medicine | Admitting: Internal Medicine

## 2016-04-06 DIAGNOSIS — D631 Anemia in chronic kidney disease: Secondary | ICD-10-CM | POA: Diagnosis not present

## 2016-04-06 DIAGNOSIS — I2510 Atherosclerotic heart disease of native coronary artery without angina pectoris: Secondary | ICD-10-CM | POA: Diagnosis not present

## 2016-04-06 DIAGNOSIS — J810 Acute pulmonary edema: Secondary | ICD-10-CM | POA: Diagnosis present

## 2016-04-06 DIAGNOSIS — Z992 Dependence on renal dialysis: Secondary | ICD-10-CM | POA: Diagnosis not present

## 2016-04-06 DIAGNOSIS — Z8673 Personal history of transient ischemic attack (TIA), and cerebral infarction without residual deficits: Secondary | ICD-10-CM

## 2016-04-06 DIAGNOSIS — Z91041 Radiographic dye allergy status: Secondary | ICD-10-CM | POA: Diagnosis not present

## 2016-04-06 DIAGNOSIS — Z886 Allergy status to analgesic agent status: Secondary | ICD-10-CM

## 2016-04-06 DIAGNOSIS — J9600 Acute respiratory failure, unspecified whether with hypoxia or hypercapnia: Secondary | ICD-10-CM | POA: Diagnosis present

## 2016-04-06 DIAGNOSIS — N186 End stage renal disease: Secondary | ICD-10-CM | POA: Diagnosis not present

## 2016-04-06 DIAGNOSIS — I169 Hypertensive crisis, unspecified: Secondary | ICD-10-CM | POA: Diagnosis not present

## 2016-04-06 DIAGNOSIS — I132 Hypertensive heart and chronic kidney disease with heart failure and with stage 5 chronic kidney disease, or end stage renal disease: Secondary | ICD-10-CM | POA: Diagnosis not present

## 2016-04-06 DIAGNOSIS — I252 Old myocardial infarction: Secondary | ICD-10-CM

## 2016-04-06 DIAGNOSIS — Z79899 Other long term (current) drug therapy: Secondary | ICD-10-CM

## 2016-04-06 DIAGNOSIS — Z85038 Personal history of other malignant neoplasm of large intestine: Secondary | ICD-10-CM | POA: Diagnosis not present

## 2016-04-06 DIAGNOSIS — Z8543 Personal history of malignant neoplasm of ovary: Secondary | ICD-10-CM

## 2016-04-06 DIAGNOSIS — I429 Cardiomyopathy, unspecified: Secondary | ICD-10-CM | POA: Diagnosis present

## 2016-04-06 DIAGNOSIS — Z955 Presence of coronary angioplasty implant and graft: Secondary | ICD-10-CM

## 2016-04-06 DIAGNOSIS — Z888 Allergy status to other drugs, medicaments and biological substances status: Secondary | ICD-10-CM | POA: Diagnosis not present

## 2016-04-06 DIAGNOSIS — J9601 Acute respiratory failure with hypoxia: Secondary | ICD-10-CM | POA: Diagnosis not present

## 2016-04-06 DIAGNOSIS — Z951 Presence of aortocoronary bypass graft: Secondary | ICD-10-CM | POA: Diagnosis not present

## 2016-04-06 DIAGNOSIS — I160 Hypertensive urgency: Secondary | ICD-10-CM | POA: Diagnosis not present

## 2016-04-06 DIAGNOSIS — Z8249 Family history of ischemic heart disease and other diseases of the circulatory system: Secondary | ICD-10-CM

## 2016-04-06 DIAGNOSIS — I120 Hypertensive chronic kidney disease with stage 5 chronic kidney disease or end stage renal disease: Secondary | ICD-10-CM | POA: Diagnosis not present

## 2016-04-06 DIAGNOSIS — K219 Gastro-esophageal reflux disease without esophagitis: Secondary | ICD-10-CM | POA: Diagnosis not present

## 2016-04-06 DIAGNOSIS — R079 Chest pain, unspecified: Secondary | ICD-10-CM | POA: Diagnosis not present

## 2016-04-06 DIAGNOSIS — I129 Hypertensive chronic kidney disease with stage 1 through stage 4 chronic kidney disease, or unspecified chronic kidney disease: Secondary | ICD-10-CM | POA: Diagnosis not present

## 2016-04-06 DIAGNOSIS — I5032 Chronic diastolic (congestive) heart failure: Secondary | ICD-10-CM | POA: Diagnosis present

## 2016-04-06 LAB — PHOSPHORUS: PHOSPHORUS: 5.7 mg/dL — AB (ref 2.5–4.6)

## 2016-04-06 LAB — BASIC METABOLIC PANEL
Anion gap: 15 (ref 5–15)
BUN: 48 mg/dL — AB (ref 6–20)
CHLORIDE: 95 mmol/L — AB (ref 101–111)
CO2: 28 mmol/L (ref 22–32)
CREATININE: 10.13 mg/dL — AB (ref 0.44–1.00)
Calcium: 10 mg/dL (ref 8.9–10.3)
GFR calc Af Amer: 4 mL/min — ABNORMAL LOW (ref >60)
GFR calc non Af Amer: 3 mL/min — ABNORMAL LOW (ref >60)
GLUCOSE: 113 mg/dL — AB (ref 65–99)
POTASSIUM: 4.7 mmol/L (ref 3.5–5.1)
SODIUM: 138 mmol/L (ref 135–145)

## 2016-04-06 LAB — CBC
HEMATOCRIT: 28.3 % — AB (ref 36.0–46.0)
Hemoglobin: 8.9 g/dL — ABNORMAL LOW (ref 12.0–15.0)
MCH: 31.2 pg (ref 26.0–34.0)
MCHC: 31.4 g/dL (ref 30.0–36.0)
MCV: 99.3 fL (ref 78.0–100.0)
PLATELETS: 276 10*3/uL (ref 150–400)
RBC: 2.85 MIL/uL — ABNORMAL LOW (ref 3.87–5.11)
RDW: 17.9 % — AB (ref 11.5–15.5)
WBC: 7.5 10*3/uL (ref 4.0–10.5)

## 2016-04-06 LAB — COMPREHENSIVE METABOLIC PANEL
ALT: 15 U/L (ref 14–54)
ANION GAP: 15 (ref 5–15)
AST: 22 U/L (ref 15–41)
Albumin: 3.5 g/dL (ref 3.5–5.0)
Alkaline Phosphatase: 58 U/L (ref 38–126)
BILIRUBIN TOTAL: 0.7 mg/dL (ref 0.3–1.2)
BUN: 53 mg/dL — AB (ref 6–20)
CHLORIDE: 96 mmol/L — AB (ref 101–111)
CO2: 26 mmol/L (ref 22–32)
Calcium: 9.9 mg/dL (ref 8.9–10.3)
Creatinine, Ser: 10.27 mg/dL — ABNORMAL HIGH (ref 0.44–1.00)
GFR, EST AFRICAN AMERICAN: 4 mL/min — AB (ref >60)
GFR, EST NON AFRICAN AMERICAN: 3 mL/min — AB (ref >60)
Glucose, Bld: 141 mg/dL — ABNORMAL HIGH (ref 65–99)
POTASSIUM: 5.6 mmol/L — AB (ref 3.5–5.1)
Sodium: 137 mmol/L (ref 135–145)
TOTAL PROTEIN: 6.3 g/dL — AB (ref 6.5–8.1)

## 2016-04-06 LAB — I-STAT TROPONIN, ED: Troponin i, poc: 0.01 ng/mL (ref 0.00–0.08)

## 2016-04-06 LAB — MRSA PCR SCREENING: MRSA by PCR: NEGATIVE

## 2016-04-06 IMAGING — CR DG CHEST 1V PORT
1 series · 1 of 1 positions shown · non-contrast
Comparison: Chest radiograph dated [DATE]

CLINICAL DATA: 75-year-old female with respiratory distress.

EXAM:
PORTABLE CHEST 1 VIEW

[portable]
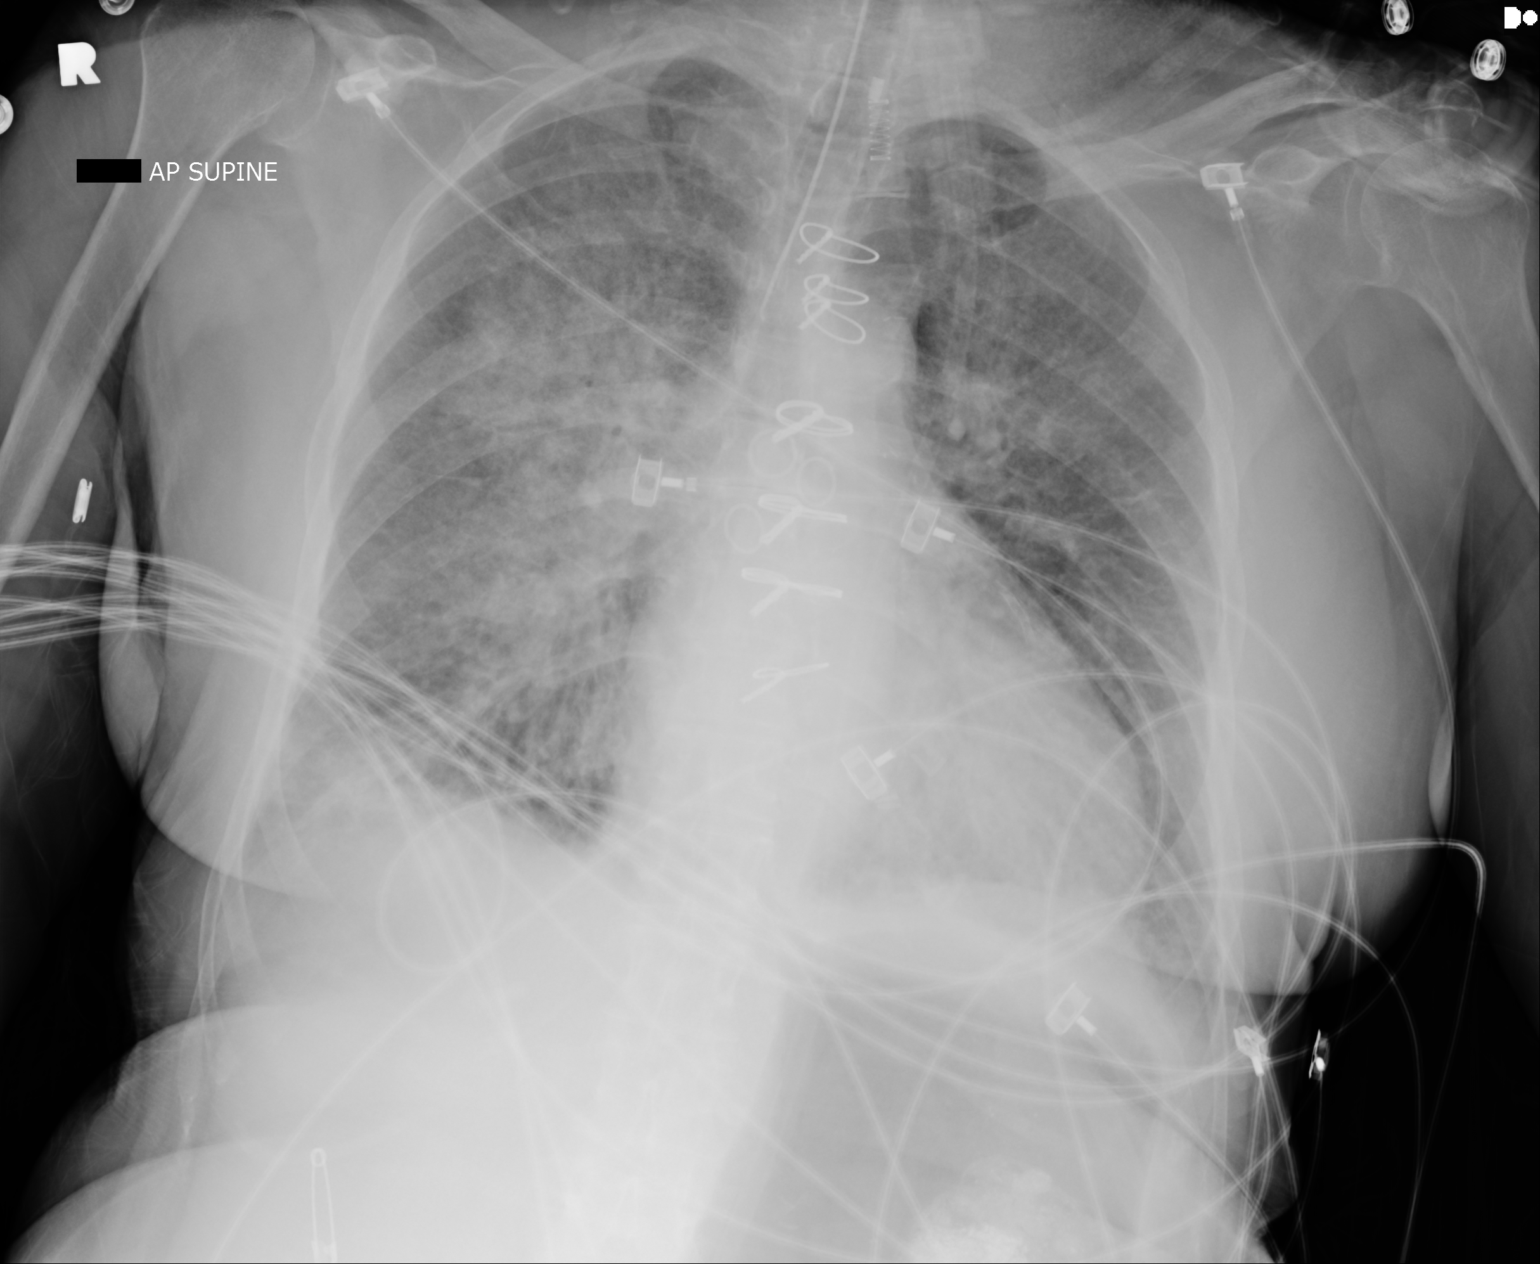

[1 of 1 positions shown; findings below may reference images not displayed]

FINDINGS: There has been interval placement of an endotracheal tube the tip
approximately 2.5 cm above the carina. There has been interval
development of diffuse bilateral airspace densities most prominent
on the right which may represent ARDS, pulmonary edema, and less
likely pneumonia given rapid interval progression. Clinical
correlation recommended. No significant pleural effusion. No
pneumothorax. The cardiac silhouette is within normal limits. Median
sternotomy wires and CABG surgery noted. No acute osseous pathology.
IMPRESSION: Endotracheal tube above the carina.

Interval development of diffuse airspace densities.

## 2016-04-06 IMAGING — DX DG CHEST 2V
2 series · 2 of 2 positions shown · non-contrast
Comparison: Chest radiograph [DATE]

CLINICAL DATA: Acute onset LEFT chest pain radiating to arm 2 hours
ago, nausea. History of myocardial infarction, hypertension and
end-stage renal disease on dialysis.

EXAM:
CHEST  2 VIEW

[chest lat]
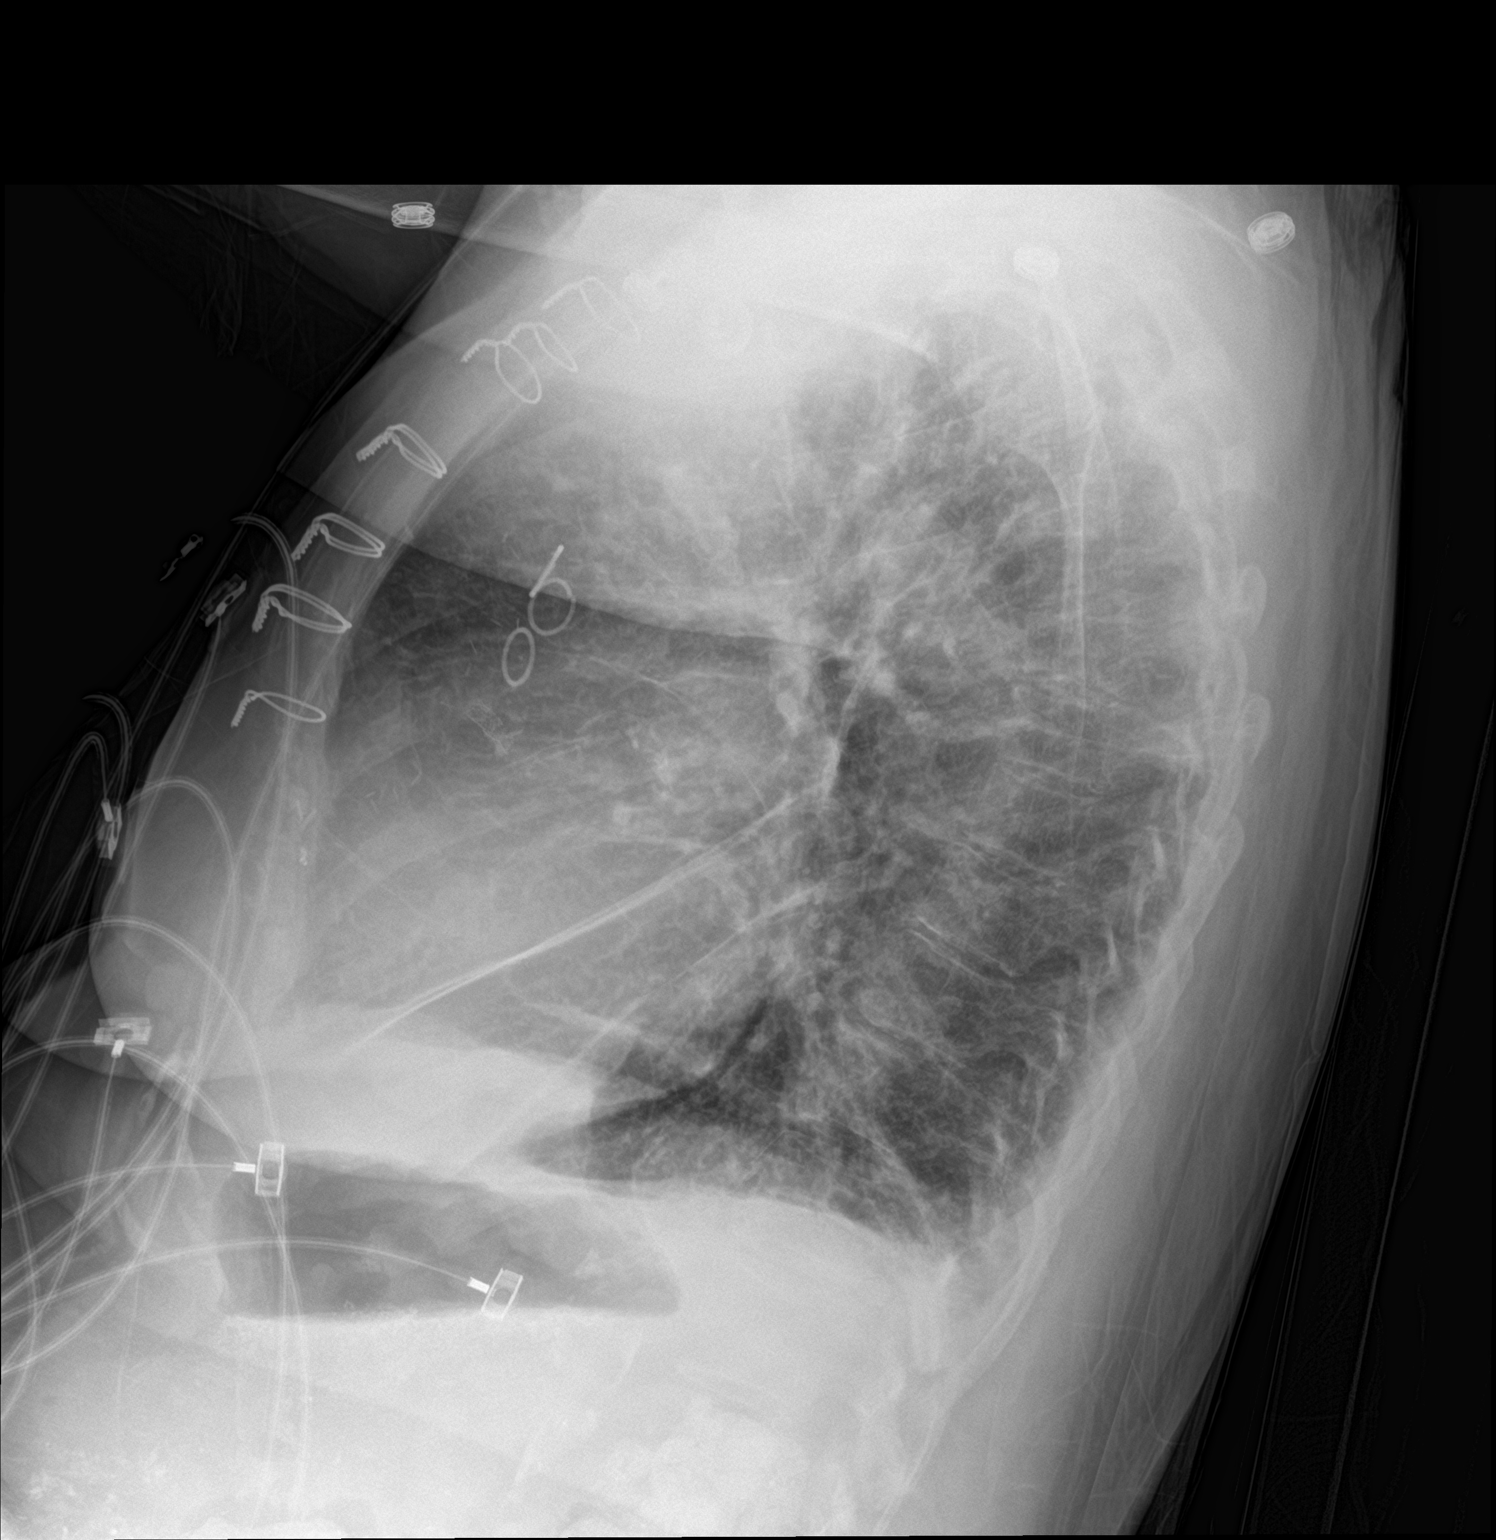

[chest ap]
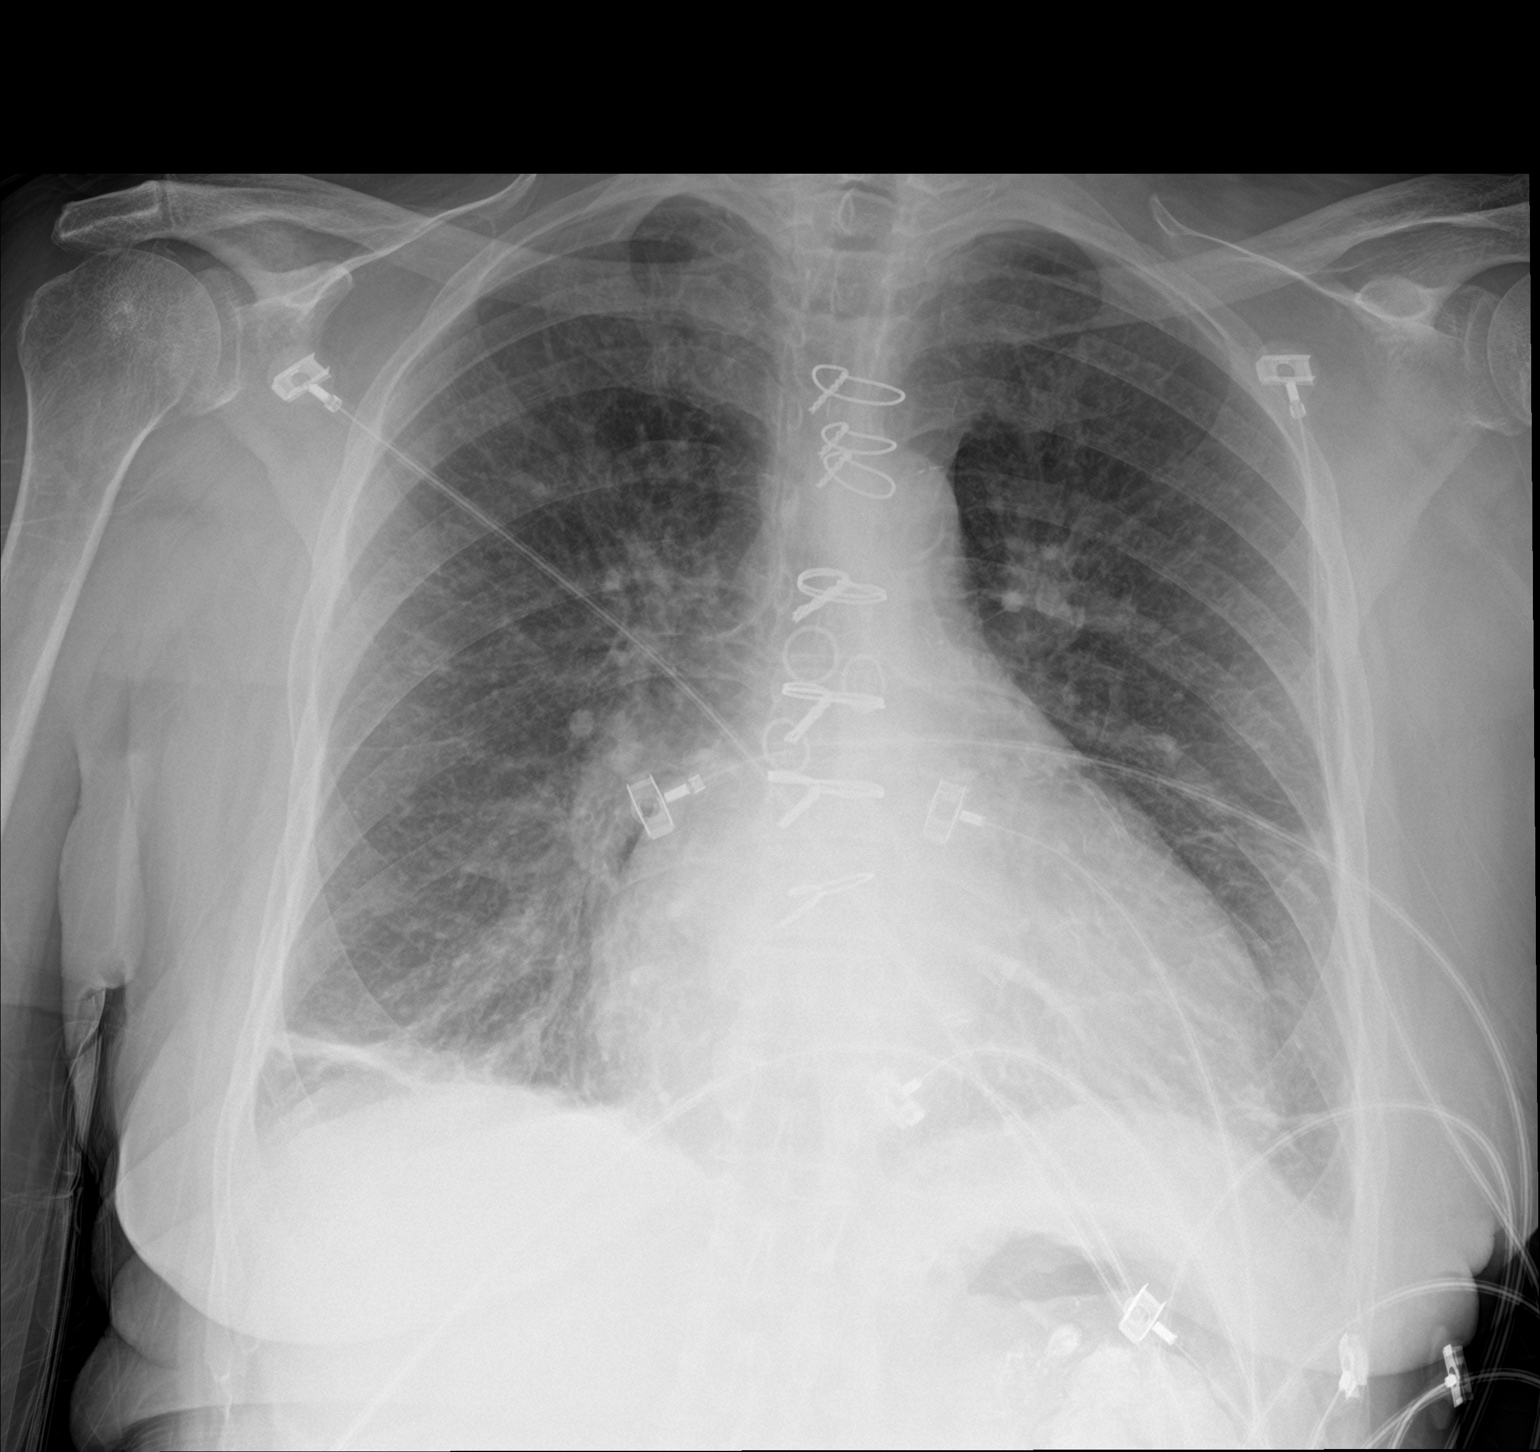

[2 of 2 positions shown; findings below may reference images not displayed]

FINDINGS: The cardiac silhouette is moderately enlarged. Status post median
sternotomy for CABG. Coronary artery stents noted. Increasing
bronchitic changes and mild interstitial prominence. Bandlike
densities in the LEFT midlung zone, bilateral lung bases persist.
Slight blunting of the RIGHT costophrenic angle. No pneumothorax.
Residual enteric contrast. Osseous structures are unchanged.
IMPRESSION: Stable cardiomegaly. Increasing bronchitic changes and interstitial
prominence can be seen with CHF or atypical infection without focal
consolidation.

Stable LEFT midlung zone, bibasilar atelectasis/scarring.

## 2016-04-06 MED ORDER — ALBUTEROL SULFATE (2.5 MG/3ML) 0.083% IN NEBU
INHALATION_SOLUTION | RESPIRATORY_TRACT | Status: AC
  Administered 2016-04-06: 2.5 mg via RESPIRATORY_TRACT
  Filled 2016-04-06: qty 3

## 2016-04-06 MED ORDER — ALTEPLASE 2 MG IJ SOLR
2.0000 mg | Freq: Once | INTRAMUSCULAR | Status: DC | PRN
  Filled 2016-04-06: qty 2

## 2016-04-06 MED ORDER — SODIUM CHLORIDE 0.9% FLUSH: 3.0000 mL | INTRAVENOUS | Status: DC | PRN

## 2016-04-06 MED ORDER — FUROSEMIDE 10 MG/ML IJ SOLN
INTRAMUSCULAR | Status: AC
  Filled 2016-04-06: qty 2

## 2016-04-06 MED ORDER — FAMOTIDINE IN NACL 20-0.9 MG/50ML-% IV SOLN
20.0000 mg | INTRAVENOUS | Status: DC
  Administered 2016-04-06 – 2016-04-07 (×2): 20 mg via INTRAVENOUS
  Filled 2016-04-06 (×2): qty 50

## 2016-04-06 MED ORDER — HEPARIN SODIUM (PORCINE) 5000 UNIT/ML IJ SOLN
5000.0000 [IU] | Freq: Three times a day (TID) | INTRAMUSCULAR | Status: DC
  Administered 2016-04-06 – 2016-04-07 (×5): 5000 [IU] via SUBCUTANEOUS
  Filled 2016-04-06 (×5): qty 1

## 2016-04-06 MED ORDER — ACETAMINOPHEN 650 MG RE SUPP: 650.0000 mg | Freq: Four times a day (QID) | RECTAL | Status: DC | PRN

## 2016-04-06 MED ORDER — SODIUM CHLORIDE 0.9% FLUSH
3.0000 mL | Freq: Two times a day (BID) | INTRAVENOUS | Status: DC
  Administered 2016-04-06 – 2016-04-07 (×4): 3 mL via INTRAVENOUS

## 2016-04-06 MED ORDER — FAMOTIDINE IN NACL 20-0.9 MG/50ML-% IV SOLN
20.0000 mg | Freq: Two times a day (BID) | INTRAVENOUS | Status: DC
Start: 2016-04-06 — End: 2016-04-06

## 2016-04-06 MED ORDER — LABETALOL HCL 5 MG/ML IV SOLN
INTRAVENOUS | Status: AC
  Administered 2016-04-06: 20 mg via INTRAVENOUS
  Filled 2016-04-06: qty 4

## 2016-04-06 MED ORDER — LIDOCAINE-PRILOCAINE 2.5-2.5 % EX CREA
1.0000 "application " | TOPICAL_CREAM | CUTANEOUS | Status: DC | PRN
  Filled 2016-04-06: qty 5

## 2016-04-06 MED ORDER — HEPARIN SODIUM (PORCINE) 1000 UNIT/ML DIALYSIS
20.0000 [IU]/kg | INTRAMUSCULAR | Status: DC | PRN
  Administered 2016-04-06: 1200 [IU] via INTRAVENOUS_CENTRAL
  Filled 2016-04-06 (×2): qty 2

## 2016-04-06 MED ORDER — FUROSEMIDE 10 MG/ML IJ SOLN
100.0000 mg | Freq: Once | INTRAMUSCULAR | Status: AC
  Administered 2016-04-06: 100 mg via INTRAVENOUS

## 2016-04-06 MED ORDER — MIDAZOLAM HCL 50 MG/10ML IJ SOLN
INTRAMUSCULAR | Status: AC
  Filled 2016-04-06: qty 1

## 2016-04-06 MED ORDER — NITROGLYCERIN IN D5W 200-5 MCG/ML-% IV SOLN
5.0000 ug/min | Freq: Once | INTRAVENOUS | Status: AC
  Administered 2016-04-06: 5 ug/min via INTRAVENOUS
  Filled 2016-04-06: qty 250

## 2016-04-06 MED ORDER — LABETALOL HCL 5 MG/ML IV SOLN
20.0000 mg | INTRAVENOUS | Status: DC | PRN
  Administered 2016-04-06 – 2016-04-07 (×3): 20 mg via INTRAVENOUS
  Filled 2016-04-06 (×4): qty 4

## 2016-04-06 MED ORDER — SODIUM CHLORIDE 0.9 % IV SOLN
1.0000 mg/h | INTRAVENOUS | Status: DC
  Administered 2016-04-06: 1 mg/h via INTRAVENOUS
  Filled 2016-04-06: qty 10

## 2016-04-06 MED ORDER — SODIUM CHLORIDE 0.9 % IV SOLN: 100.0000 mL | INTRAVENOUS | Status: DC | PRN

## 2016-04-06 MED ORDER — ONDANSETRON HCL 4 MG PO TABS: 4.0000 mg | ORAL_TABLET | Freq: Four times a day (QID) | ORAL | Status: DC | PRN

## 2016-04-06 MED ORDER — LABETALOL HCL 5 MG/ML IV SOLN
INTRAVENOUS | Status: AC
  Filled 2016-04-06: qty 20

## 2016-04-06 MED ORDER — PENTAFLUOROPROP-TETRAFLUOROETH EX AERO
1.0000 "application " | INHALATION_SPRAY | CUTANEOUS | Status: DC | PRN
  Filled 2016-04-06: qty 30

## 2016-04-06 MED ORDER — FUROSEMIDE 10 MG/ML IJ SOLN
INTRAMUSCULAR | Status: AC
  Filled 2016-04-06: qty 8

## 2016-04-06 MED ORDER — ALBUTEROL SULFATE (2.5 MG/3ML) 0.083% IN NEBU
2.5000 mg | INHALATION_SOLUTION | Freq: Once | RESPIRATORY_TRACT | Status: AC
  Administered 2016-04-06: 2.5 mg via RESPIRATORY_TRACT

## 2016-04-06 MED ORDER — LIDOCAINE HCL (PF) 1 % IJ SOLN: 5.0000 mL | INTRAMUSCULAR | Status: DC | PRN

## 2016-04-06 MED ORDER — HEPARIN SODIUM (PORCINE) 1000 UNIT/ML DIALYSIS
1000.0000 [IU] | INTRAMUSCULAR | Status: DC | PRN
  Filled 2016-04-06: qty 1

## 2016-04-06 MED ORDER — ONDANSETRON HCL 4 MG/2ML IJ SOLN: 4.0000 mg | Freq: Four times a day (QID) | INTRAMUSCULAR | Status: DC | PRN

## 2016-04-06 MED ORDER — LABETALOL HCL 5 MG/ML IV SOLN
0.5000 mg/min | INTRAVENOUS | Status: DC
  Administered 2016-04-06: 0.5 mg/min via INTRAVENOUS
  Filled 2016-04-06: qty 100

## 2016-04-06 MED ORDER — ACETAMINOPHEN 325 MG PO TABS: 650.0000 mg | ORAL_TABLET | Freq: Four times a day (QID) | ORAL | Status: DC | PRN

## 2016-04-06 MED ORDER — EPOETIN ALFA 4000 UNIT/ML IJ SOLN
INTRAMUSCULAR | Status: AC
  Administered 2016-04-06: 4000 [IU] via INTRAVENOUS
  Filled 2016-04-06: qty 1

## 2016-04-06 MED ORDER — EPOETIN ALFA 4000 UNIT/ML IJ SOLN
4000.0000 [IU] | INTRAMUSCULAR | Status: DC
  Administered 2016-04-06 (×2): 4000 [IU] via INTRAVENOUS
  Filled 2016-04-06 (×2): qty 1

## 2016-04-06 MED ORDER — LABETALOL HCL 5 MG/ML IV SOLN
20.0000 mg | Freq: Once | INTRAVENOUS | Status: AC
  Administered 2016-04-06: 20 mg via INTRAVENOUS

## 2016-04-06 MED ORDER — HEPARIN SODIUM (PORCINE) 1000 UNIT/ML IJ SOLN
INTRAMUSCULAR | Status: AC
  Administered 2016-04-06: 1200 [IU] via INTRAVENOUS_CENTRAL
  Filled 2016-04-06: qty 3

## 2016-04-06 MED ORDER — SODIUM CHLORIDE 0.9 % IV SOLN: 250.0000 mL | INTRAVENOUS | Status: DC | PRN

## 2016-04-06 NOTE — Care Management Note (Signed)
Case Management Note  Patient Details  Name: Shawna [name] MRN: [id number] Date of Birth: [birth year]  Subjective/Objective:                  Pt admitted with volume overload. She is from home (lives in [place], [place]). She lives with her husband and is ind with ADL's. She is on HD MWF. She has transportation to appointments. She has no HH services or oxygen PTA. Pt plans to return home with self care. Anticipate DC home in nxt 24-48hrs.   Action/Plan: Will cont to follow.   Expected Discharge Date:     04/07/2016             Expected Discharge Plan:  Home/Self Care  In-House Referral:  Clinical Social Work  Discharge planning Services  CM Consult  Post Acute Care Choice:  NA Choice offered to:  NA  DME Arranged:    DME Agency:     HH Arranged:    HH Agency:     Status of Service:  In process, will continue to follow  If discussed at Long Length of Stay Meetings, dates discussed:    Additional Comments:  Sherald Barge, RN 04/06/2016, 2:00 PM

## 2016-04-06 NOTE — H&P (Signed)
TRH H&P    Patient Demographics:    Shawna [name], is a 75 y.o. female  MRN: [id number]  DOB - 05/25/1941  Admit Date - 04/06/2016  Referring MD/NP/PA: Dr. Tomi Bamberger  Outpatient Primary MD for the patient is [name], MD  Patient coming from: Home  Chief Complaint  Patient presents with  . Chest Pain      HPI:    Shawna [name]  is a 75 y.o. female, With history of ESRD on hemodialysis Monday Wednesday Friday, coronary artery disease, diastolic heart failure, colon cancer, ovarian cancer, hypertension who came to the hospital for chest pain and shortness of breath. Patient was in flash pulmonary edema with hypertensive urgency , started on nitroglycerin infusion , on BiPAP and plan for emergent dialysis today. Nephrology was consulted by ED physician. Patient is unable to provide any history at this time as she is on BiPAP and in moderate respiratory distress.  No history of nausea or vomiting. No fever    Review of systems:     A full 10 point Review of Systems was done, except as stated above, all other Review of Systems were negative.   With Past History of the following :    Past Medical History:  Diagnosis Date  . Anxiety   . Arthritis   . AVM (arteriovenous malformation) of colon   . CAD (coronary artery disease)    a. 12/2011 NSTEMI/Cath/PCI LCX (2.25x14 Resolute DES) & D1 (2.25x22 Resolute DES);  b. 01/2012 Cath/PCI: LM 30, LAD 30p, 40-32m, D1 stent ok, 99 in sm branch of diag, LCX patent stent, OM1 20, RCA 95 ost (4.0x12 Promus DES), EF 55%;  c. 04/2012 Lexi Cardiolite  EF 48%, small area of scar @ base/mid inflat wall with mild peri-infarct ischemia.; CABG 12/4  . Carotid artery disease ([place])    a. 62-56% LICA, 09/8935   . Chronic bronchitis ([place])   . Chronic diastolic CHF (congestive heart failure) ([place])    a. 02/2012 Echo EF 60-65%, nl wall motion, Gr 1 DD, mod MR  . Colon cancer ([place]) 1992    . Esophageal stricture   . ESRD on hemodialysis ([place])    ESRD due to HTN, started dialysis 2011 and gets HD [facility] with Dr Hinda Lenis on MWF schedule.  Access is LUA AVF as of Sept 2014.   Marland Kitchen GERD (gastroesophageal reflux disease)   . High cholesterol 12/2011  . History of blood transfusion 07/2011; 12/2011; 01/2012 X 2; 04/2012  . History of gout   . History of lower GI bleeding   . Hypertension   . Iron deficiency anemia   . Mitral regurgitation    a. Moderate by echo, 02/2012  . Myocardial infarction ([place])   . Ovarian cancer ([place]) 1992  . Pneumonia ~ 2009  . PUD (peptic ulcer disease)   . TIA (transient ischemic attack)       Past Surgical History:  Procedure Laterality Date  . ABDOMINAL HYSTERECTOMY  1992  . APPENDECTOMY  06/1990  . AV FISTULA PLACEMENT  07/2009  left upper arm  . COLON RESECTION  1992  . CORONARY ANGIOPLASTY WITH STENT PLACEMENT  12/15/11   "2"  . CORONARY ANGIOPLASTY WITH STENT PLACEMENT  y/2013   "1; makes total of 3" (05/02/2012)  . CORONARY ARTERY BYPASS GRAFT  06/13/2012   Procedure: CORONARY ARTERY BYPASS GRAFTING (CABG);  Surgeon: Grace Isaac, MD;  Location: [place];  Service: Open Heart Surgery;  Laterality: N/A;  cabg x four;  using left internal mammary artery, and left leg greater saphenous vein harvested endoscopically  . DILATION AND CURETTAGE OF UTERUS    . ESOPHAGOGASTRODUODENOSCOPY  01/20/2012   Procedure: ESOPHAGOGASTRODUODENOSCOPY (EGD);  Surgeon: Ladene Artist, MD,FACG;  Location: [facility] ENDOSCOPY;  Service: Endoscopy;  Laterality: N/A;  . ESOPHAGOGASTRODUODENOSCOPY N/A 03/26/2013   Procedure: ESOPHAGOGASTRODUODENOSCOPY (EGD);  Surgeon: Irene Shipper, MD;  Location: [facility] ENDOSCOPY;  Service: Endoscopy;  Laterality: N/A;  . ESOPHAGOGASTRODUODENOSCOPY N/A 04/30/2015   Procedure: ESOPHAGOGASTRODUODENOSCOPY (EGD);  Surgeon: Rogene [place], MD;  [facility];  Service: Endoscopy;  Laterality: N/A;  1pm - moved to 10/20 @ 1:10  .  INTRAOPERATIVE TRANSESOPHAGEAL ECHOCARDIOGRAM  06/13/2012   Procedure: INTRAOPERATIVE TRANSESOPHAGEAL ECHOCARDIOGRAM;  Surgeon: Grace Isaac, MD;  Location: [place];  Service: Open Heart Surgery;  Laterality: N/A;  . LEFT HEART CATHETERIZATION WITH CORONARY ANGIOGRAM N/A 12/15/2011   Procedure: LEFT HEART CATHETERIZATION WITH CORONARY ANGIOGRAM;  Surgeon: Burnell Blanks, MD;  Location: [facility] CATH LAB;  Service: Cardiovascular;  Laterality: N/A;  . LEFT HEART CATHETERIZATION WITH CORONARY ANGIOGRAM N/A 01/10/2012   Procedure: LEFT HEART CATHETERIZATION WITH CORONARY ANGIOGRAM;  Surgeon: Peter M Martinique, MD;  Location: [facility] CATH LAB;  Service: Cardiovascular;  Laterality: N/A;  . LEFT HEART CATHETERIZATION WITH CORONARY ANGIOGRAM N/A 06/08/2012   Procedure: LEFT HEART CATHETERIZATION WITH CORONARY ANGIOGRAM;  Surgeon: Burnell Blanks, MD;  Location: [facility] CATH LAB;  Service: Cardiovascular;  Laterality: N/A;  . LEFT HEART CATHETERIZATION WITH CORONARY/GRAFT ANGIOGRAM N/A 12/10/2013   Procedure: LEFT HEART CATHETERIZATION WITH Beatrix Fetters;  Surgeon: [name], MD;  Location: [name] CATH LAB;  Service: Cardiovascular;  Laterality: N/A;  . OVARY SURGERY     ovarian cancer  . SHUNTOGRAM N/A 10/15/2013   Procedure: Fistulogram;  Surgeon: Serafina Mitchell, MD;  Location: [facility] CATH LAB;  Service: Cardiovascular;  Laterality: N/A;  . THROMBECTOMY / ARTERIOVENOUS GRAFT REVISION  2011   left upper arm  . TUBAL LIGATION  1980's      Social History:      Social History  Substance Use Topics  . Smoking status: Never Smoker  . Smokeless tobacco: Never Used  . Alcohol use No       Family History :     Family History  Problem Relation Age of Onset  . Heart disease Mother     Heart Disease before age 71  . Hyperlipidemia Mother   . Hypertension Mother   . Diabetes Mother   . Heart attack Mother   . Heart disease Father     Heart Disease before age 2  . Hyperlipidemia Father    . Hypertension Father   . Diabetes Father   . Diabetes Sister   . Hypertension Sister   . Diabetes Brother   . Hyperlipidemia Brother   . Heart attack Brother   . Hypertension Sister   . Heart attack Brother   . Other      noncontributory for early CAD  . Colon cancer Neg Hx   . Esophageal cancer Neg  Hx   . Liver disease Neg Hx   . Kidney disease Neg Hx   . Colon polyps Neg Hx       Home Medications:   Prior to Admission medications   Medication Sig Start Date End Date Taking? Authorizing Provider  ALPRAZolam Duanne Moron) 0.25 MG tablet Take 1-2 tablets by mouth at bedtime as needed for sleep.  11/19/15   Historical Provider, MD  amiodarone (PACERONE) 200 MG tablet Take 1 tablet (200 mg total) by mouth daily. 09/23/15   Herminio Commons, MD  aspirin EC 81 MG tablet Take 81 mg by mouth daily.     Historical Provider, MD  bacitracin ophthalmic ointment  12/08/15   Historical Provider, MD  cloNIDine (CATAPRES) 0.3 MG tablet Take 1 tablet (0.3 mg total) by mouth daily. *May take an additional tablet based on blood pressure levels* 01/28/16   Rexene Alberts, MD  fluticasone [facility]) 50 MCG/ACT nasal spray Place 2 sprays into the nose daily.     Historical Provider, MD  folic acid-vitamin b complex-vitamin c-selenium-zinc (DIALYVITE) 3 MG TABS Take 1 tablet by mouth daily.    Historical Provider, MD  hydrALAZINE (APRESOLINE) 50 MG tablet Take 3 tablets (150 mg total) by mouth 2 (two) times daily. 01/28/16   Rexene Alberts, MD  isosorbide dinitrate (ISORDIL) 30 MG tablet Take 1 tablet (30 mg total) by mouth 3 (three) times daily. 07/28/15   Herminio Commons, MD  labetalol (NORMODYNE) 200 MG tablet Take 1.5 tablets (300 mg total) by mouth 2 (two) times daily. 06/17/15   Herminio Commons, MD  lanthanum (FOSRENOL) 1000 MG chewable tablet Chew 1,000 mg by mouth 3 (three) times daily after meals.    Historical Provider, MD  NITROSTAT 0.4 MG SL tablet DISSOLVE ONE TABLET UNDER THE TONGUE EVERY 5  MINUTES AS NEEDED FOR CHEST PAIN.  DO NOT EXCEED A TOTAL OF 3 DOSES IN 15 MINUTES AS NEEDED 01/20/16   Herminio Commons, MD  omeprazole (PRILOSEC) 20 MG capsule Take 1 capsule (20 mg total) by mouth daily. 05/12/15   Butch Penny, NP  ondansetron (ZOFRAN-ODT) 4 MG disintegrating tablet Take 4 mg by mouth every 8 (eight) hours as needed for nausea or vomiting. nausea 05/18/15   Historical Provider, MD  PROAIR HFA 108 (90 BASE) MCG/ACT inhaler Inhale 1 puff into the lungs every 6 (six) hours as needed for wheezing or shortness of breath.  01/09/15   Historical Provider, MD  SENSIPAR 30 MG tablet Take 30 mg by mouth daily with supper.  12/21/12   Historical Provider, MD  simvastatin (ZOCOR) 20 MG tablet Take 1 tablet (20 mg total) by mouth at bedtime. 04/04/16   Lendon Colonel, NP     Allergies:        Physical Exam:   Vitals  Blood pressure 142/82, pulse 87, temperature 98.5 F (36.9 C), temperature source Oral, resp. rate 15, height 5\' 1"  (1.549 m), weight 60.3 kg (133 lb), SpO2 97 %.  1.  General: Patient in moderate distress, on BiPAP  2. Psychiatric: awake alert, oriented x 3.   3. Neurologic: No focal neurological deficits, all cranial nerves intact.Strength 5/5 all 4 extremities, sensation intact all 4 extremities, plantars down going.  4. Eyes :  anicteric sclerae, moist conjunctivae with no lid lag.   5. ENMT:  On BiPAP  6. Neck:  supple, no cervical lymphadenopathy appriciated, No thyromegaly  7. Respiratory : Respiratory distress, good air movement bilaterally, bilateral crackles  8. Cardiovascular :  RRR, no gallops, rubs or murmurs, no leg edema  9. Gastrointestinal:  Positive bowel sounds, abdomen soft, non-tender to palpation,no hepatosplenomegaly, no rigidity or guarding       10. Skin:  No cyanosis, normal texture and turgor, no rash, lesions or ulcers  11.Musculoskeletal:  Good muscle tone,  joints appear normal , no effusions,  normal range of  motion    Data Review:    CBC  Recent Labs Lab 04/06/16 0111  WBC 7.5  HGB 8.9*  HCT 28.3*  PLT 276  MCV 99.3  MCH 31.2  MCHC 31.4  RDW 17.9*   ------------------------------------------------------------------------------------------------------------------  Chemistries   Recent Labs Lab 04/06/16 0111  NA 138  K 4.7  CL 95*  CO2 28  GLUCOSE 113*  BUN 48*  CREATININE 10.13*  CALCIUM 10.0   ------------------------------------------------------------------------------------------------------------------   --------------------------------------------------------------------------------------------------------------- Urine analysis:    Component Value Date/Time   COLORURINE YELLOW 12/16/2012 1919   APPEARANCEUR CLOUDY (A) 12/16/2012 1919   LABSPEC 1.009 12/16/2012 1919   PHURINE 7.5 12/16/2012 1919   GLUCOSEU NEGATIVE 12/16/2012 1919   HGBUR TRACE (A) 12/16/2012 1919   BILIRUBINUR NEGATIVE 12/16/2012 1919   KETONESUR NEGATIVE 12/16/2012 1919   PROTEINUR 100 (A) 12/16/2012 1919   UROBILINOGEN 0.2 12/16/2012 1919   NITRITE NEGATIVE 12/16/2012 1919   LEUKOCYTESUR SMALL (A) 12/16/2012 1919      Imaging Results:    Dg Chest 2 View  Result Date: 04/06/2016 CLINICAL DATA:  Acute onset LEFT chest pain radiating to arm 2 hours ago, nausea. History of myocardial infarction, hypertension and end-stage renal disease on dialysis. EXAM: CHEST  2 VIEW COMPARISON:  Chest radiograph February 24, 2016 FINDINGS: The cardiac silhouette is moderately enlarged. Status post median sternotomy for CABG. Coronary artery stents noted. Increasing bronchitic changes and mild interstitial prominence. Bandlike densities in the LEFT midlung zone, bilateral lung bases persist. Slight blunting of the RIGHT costophrenic angle. No pneumothorax. Residual enteric contrast. Osseous structures are unchanged. IMPRESSION: Stable cardiomegaly. Increasing bronchitic changes and interstitial prominence  can be seen with CHF or atypical infection without focal consolidation. Stable LEFT midlung zone, bibasilar atelectasis/scarring. Electronically Signed   By: Elon Alas M.D.   On: 04/06/2016 03:26    My personal review of EKG: Rhythm NSR   Assessment & Plan:    Active Problems:   Coronary atherosclerosis of native coronary artery   ESRD on hemodialysis ([place])   Hypertensive urgency   Acute pulmonary edema (HCC)   Acute respiratory failure ([place])   1. Acute hypoxic respiratory failure- secondary to pulmonary edema, patient is currently on BiPAP and about to be intubated in the ED. Plan for emergent hemodialysis, nephrology has been consulted by the ED physician Dr. Tomi Bamberger. 2. Hypertensive urgency- patient started on nitroglycerin infusion, blood pressure has improved. 3. Coronary artery disease- stable, patient came with chest pain. Will cycle cardiac enzymes.  All by mouth medications on hold as patient will be intubated. We will restart these medications once patient is extubated.  DVT Prophylaxis-  heparin  AM Labs Ordered, also please review Full Orders  Family Communication: Admission, patients condition and plan of care including tests being ordered have been discussed with the patient's husband at bedside who indicate understanding and agree with the plan and Code Status.  Code Status:Full code  Admission status: Inpatient    Time spent in minutes : 60 minutes   Gianlucca Szymborski S M.D on 04/06/2016 at 3:47 AM  Between 7am to 7pm - Pager - [phone]. After 7pm  go to [email or web address] - password [facility]  [facility] - Office  [phone]

## 2016-04-06 NOTE — Consult Note (Signed)
Reason for Consult: End-stage renal disease and pulmonary edema Referring Physician: Dr. Irven Shelling Shawna [name] is an 75 y.o. female.  HPI: Since patient is intubated and unable to get any information. The information is received from her husband who was with her in the emergency room. According to him she start having some chest pain on the left side around 11 PM with some radiation to her left arm. Since her pain continued and he decided to bring her to the emergency room where she was found to have elevated blood pressure. Patient was put on nitro drip but developed flash pulmonary edema requiring intubation. Presently her blood pressure seems to be improving. She has been admitted multiple times because of chest pain and being followed by cardiology.  Past Medical History:  Diagnosis Date  . Anxiety   . Arthritis   . AVM (arteriovenous malformation) of colon   . CAD (coronary artery disease)    a. 12/2011 NSTEMI/Cath/PCI LCX (2.25x14 Resolute DES) & D1 (2.25x22 Resolute DES);  b. 01/2012 Cath/PCI: LM 30, LAD 30p, 40-72m D1 stent ok, 99 in sm branch of diag, LCX patent stent, OM1 20, RCA 95 ost (4.0x12 Promus DES), EF 55%;  c. 04/2012 Lexi Cardiolite  EF 48%, small area of scar @ base/mid inflat wall with mild peri-infarct ischemia.; CABG 12/4  . Carotid artery disease ([place]    a. 685-46%LICA, 92/7035  . Chronic bronchitis ([place]   . Chronic diastolic CHF (congestive heart failure) ([place]    a. 02/2012 Echo EF 60-65%, nl wall motion, Gr 1 DD, mod MR  . Colon cancer ([place] 1992  . Esophageal stricture   . ESRD on hemodialysis ([place]    ESRD due to HTN, started dialysis 2011 [facility]  Access is LUA AVF as of Sept 2014.   .Marland KitchenGERD (gastroesophageal reflux disease)   . High cholesterol 12/2011  . History of blood transfusion 07/2011; 12/2011; 01/2012 X 2; 04/2012  . History of gout   . History of lower GI bleeding   . Hypertension   . Iron deficiency  anemia   . Mitral regurgitation    a. Moderate by echo, 02/2012  . Myocardial infarction ([place]   . [place]varian cancer ([place] 1992  . Pneumonia ~ 2009  . PUD (peptic ulcer disease)   . TIA (transient ischemic attack)     Past Surgical History:  Procedure Laterality Date  . ABDOMINAL HYSTERECTOMY  1992  . APPENDECTOMY  06/1990  . AV FISTULA PLACEMENT  07/2009   left upper arm  . COLON RESECTION  1992  . CORONARY ANGIOPLASTY WITH STENT PLACEMENT  12/15/11   "2"  . CORONARY ANGIOPLASTY WITH STENT PLACEMENT  y/2013   "1; makes total of 3" (05/02/2012)  . CORONARY ARTERY BYPASS GRAFT  06/13/2012   Procedure: CORONARY ARTERY BYPASS GRAFTING (CABG);  Surgeon: EGrace Isaac MD;  [place]  Service: Open Heart Surgery;  Laterality: N/A;  cabg x four;  using left internal mammary artery, and left leg greater saphenous vein harvested endoscopically  . DILATION AND CURETTAGE OF UTERUS    . ESOPHAGOGASTRODUODENOSCOPY  01/20/2012   Procedure: ESOPHAGOGASTRODUODENOSCOPY (EGD);  Surgeon: MLadene Artist MD,FACG;  [facility];  Service: Endoscopy;  Laterality: N/A;  . ESOPHAGOGASTRODUODENOSCOPY N/A 03/26/2013   Procedure: ESOPHAGOGASTRODUODENOSCOPY (EGD);  Surgeon: JIrene Shipper MD;  [facility]  Service: Endoscopy;  Laterality: N/A;  . ESOPHAGOGASTRODUODENOSCOPY N/A 04/30/2015  Procedure: ESOPHAGOGASTRODUODENOSCOPY (EGD);  Surgeon: Rogene [place], MD;  [facility];  Service: Endoscopy;  Laterality: N/A;  1pm - moved to 10/20 @ 1:10  . INTRAOPERATIVE TRANSESOPHAGEAL ECHOCARDIOGRAM  06/13/2012   Procedure: INTRAOPERATIVE TRANSESOPHAGEAL ECHOCARDIOGRAM;  Surgeon: Grace Isaac, MD;  Location: [place];  Service: Open Heart Surgery;  Laterality: N/A;  . LEFT HEART CATHETERIZATION WITH CORONARY ANGIOGRAM N/A 12/15/2011   Procedure: LEFT HEART CATHETERIZATION WITH CORONARY ANGIOGRAM;  Surgeon: Burnell Blanks, MD;  Location: [facility] CATH LAB;  Service: Cardiovascular;   Laterality: N/A;  . LEFT HEART CATHETERIZATION WITH CORONARY ANGIOGRAM N/A 01/10/2012   Procedure: LEFT HEART CATHETERIZATION WITH CORONARY ANGIOGRAM;  Surgeon: Peter M Martinique, MD;  Location: [facility] CATH LAB;  Service: Cardiovascular;  Laterality: N/A;  . LEFT HEART CATHETERIZATION WITH CORONARY ANGIOGRAM N/A 06/08/2012   Procedure: LEFT HEART CATHETERIZATION WITH CORONARY ANGIOGRAM;  Surgeon: Burnell Blanks, MD;  Location: [facility] CATH LAB;  Service: Cardiovascular;  Laterality: N/A;  . LEFT HEART CATHETERIZATION WITH CORONARY/GRAFT ANGIOGRAM N/A 12/10/2013   Procedure: LEFT HEART CATHETERIZATION WITH Beatrix Fetters;  Surgeon: [name], MD;  Location: [facility] CATH LAB;  Service: Cardiovascular;  Laterality: N/A;  . OVARY SURGERY     ovarian cancer  . SHUNTOGRAM N/A 10/15/2013   Procedure: Fistulogram;  Surgeon: Serafina Mitchell, MD;  Location: [facility] CATH LAB;  Service: Cardiovascular;  Laterality: N/A;  . THROMBECTOMY / ARTERIOVENOUS GRAFT REVISION  2011   left upper arm  . TUBAL LIGATION  1980's    Family History  Problem Relation Age of Onset  . Heart disease Mother     Heart Disease before age 59  . Hyperlipidemia Mother   . Hypertension Mother   . Diabetes Mother   . Heart attack Mother   . Heart disease Father     Heart Disease before age 17  . Hyperlipidemia Father   . Hypertension Father   . Diabetes Father   . Diabetes Sister   . Hypertension Sister   . Diabetes Brother   . Hyperlipidemia Brother   . Heart attack Brother   . Hypertension Sister   . Heart attack Brother   . Other      noncontributory for early CAD  . Colon cancer Neg Hx   . Esophageal cancer Neg Hx   . Liver disease Neg Hx   . Kidney disease Neg Hx   . Colon polyps Neg Hx     Social History:  reports that she has never smoked. She has never used smokeless tobacco. She reports that she does not drink alcohol or use drugs.  Allergies:    Medications: I have reviewed the patient's current  medications.  Results for orders placed or performed during the hospital encounter of 04/06/16 (from the past 48 hour(s))  Basic metabolic panel     Status: Abnormal   Collection Time: 04/06/16  1:11 AM  Result Value Ref Range   Sodium 138 135 - 145 mmol/L   Potassium 4.7 3.5 - 5.1 mmol/L   Chloride 95 (L) 101 - 111 mmol/L   CO2 28 22 - 32 mmol/L   Glucose, Bld 113 (H) 65 - 99 mg/dL   BUN 48 (H) 6 - 20 mg/dL   Creatinine, Ser 10.13 (H) 0.44 - 1.00 mg/dL   Calcium 10.0 8.9 - 10.3 mg/dL   GFR calc non Af Amer 3 (L) >60 mL/min   GFR calc Af Amer 4 (L) >60 mL/min    Comment: (NOTE) The  eGFR has been calculated using the CKD EPI equation. This calculation has not been validated in all clinical situations. eGFR's persistently <60 mL/min signify possible Chronic Kidney Disease.    Anion gap 15 5 - 15  CBC     Status: Abnormal   Collection Time: 04/06/16  1:11 AM  Result Value Ref Range   WBC 7.5 4.0 - 10.5 K/uL   RBC 2.85 (L) 3.87 - 5.11 MIL/uL   Hemoglobin 8.9 (L) 12.0 - 15.0 g/dL   HCT 28.3 (L) 36.0 - 46.0 %   MCV 99.3 78.0 - 100.0 fL   MCH 31.2 26.0 - 34.0 pg   MCHC 31.4 30.0 - 36.0 g/dL   RDW 17.9 (H) 11.5 - 15.5 %   Platelets 276 150 - 400 K/uL  I-stat troponin, ED     Status: None   Collection Time: 04/06/16  1:30 AM  Result Value Ref Range   Troponin i, poc 0.01 0.00 - 0.08 ng/mL   Comment 3            Comment: Due to the release kinetics of cTnI, a negative result within the first hours of the onset of symptoms does not rule out myocardial infarction with certainty. If myocardial infarction is still suspected, repeat the test at appropriate intervals.     Dg Chest 2 View  Result Date: 04/06/2016 CLINICAL DATA:  Acute onset LEFT chest pain radiating to arm 2 hours ago, nausea. History of myocardial infarction, hypertension and end-stage renal disease on dialysis. EXAM: CHEST  2 VIEW COMPARISON:  Chest radiograph February 24, 2016 FINDINGS: The cardiac silhouette is  moderately enlarged. Status post median sternotomy for CABG. Coronary artery stents noted. Increasing bronchitic changes and mild interstitial prominence. Bandlike densities in the LEFT midlung zone, bilateral lung bases persist. Slight blunting of the RIGHT costophrenic angle. No pneumothorax. Residual enteric contrast. Osseous structures are unchanged. IMPRESSION: Stable cardiomegaly. Increasing bronchitic changes and interstitial prominence can be seen with CHF or atypical infection without focal consolidation. Stable LEFT midlung zone, bibasilar atelectasis/scarring. Electronically Signed   By: Elon Alas M.D.   On: 04/06/2016 03:26    Review of Systems  Unable to perform ROS: Intubated   Blood pressure 177/98, pulse 82, temperature 98.5 F (36.9 C), temperature source Oral, resp. rate 12, height _0  (1.549 m), weight 60.3 kg (133 lb), SpO2 98 %. Physical Exam  Constitutional: No distress.  Neck: JVD present.  Cardiovascular: Normal rate and regular rhythm.   No murmur heard. Respiratory: She has wheezes. She has rales.  Musculoskeletal: She exhibits no edema.    Assessment/Plan: Problem #1 possible flash pulmonary edema/CHF. Presently patient is intubated. Problem #2 hypertension: Difficult to control as her blood pressure fluctuates and patient becomes symptomatic. Recently patient decide to follow suggestion from cardiology about her blood pressure. According to her husband her blood pressure has been high the last couple of days and she was trying to contact cardiology but has this moment is not definitely sure whether there is any change is made. Blood pressure usually very labile and can go from from 170-180 to 120 and 130 period where she becomes symptomatic. Problem #3 coronary artery disease: Status post bypass surgery. Presently she came with chest pain. Patient is presently on nitro drip her blood pressure has improved. Problem #4 anemia: Her hemoglobin is below target  goal. Problem #5 metabolic bone disease: Her calcium is range. Problem #6 history of GERD Plan: We'll make arrangements for patient to get dialysis today  We'll dialyze her for 31/2 hours We'll try to remove about 3 L if her blood pressure tolerates. We'll check renal panel in the morning We'll use Epogen 4000 units IV after each dialysis  [facility] S 04/06/2016, 4:40 AM

## 2016-04-06 NOTE — ED Notes (Signed)
Pt up to bathroom with standby assist- pt appears and reports being SOB with walking approx 20 feet to bathroom. Dr Tomi Bamberger made aware.

## 2016-04-06 NOTE — Progress Notes (Signed)
Follow up note from earlier admission today:  75 yo with Hx of ESRD, HTN cardiomyopathy, known CAD, admitted with HTN pulmonary edema, likely aggravated by volume overload, required intubation.  Her K was 5.7, Cr of 10, bicarb 26.  She is a little agitated this am, responded to low dose Versed bolus.  She is being dialyzed right now, and is on IV NTG drip, with SBP at 106.   Suspect with normalization of her BP, and dialysis with adequate ultrafiltration, she can be extubated today. Will continue as planned.  D/C IV NTG drip now.   Orvan Falconer MD FACP.  Hospitalist.

## 2016-04-06 NOTE — ED Notes (Signed)
Pt transported to xray 

## 2016-04-06 NOTE — Progress Notes (Signed)
Pt placed on BIPAP per MD order due to increased work of breathing.  Pt tolerating well at this time.  Pt states that it is helping but still having increased work of breathing. RT will continue to monitor.

## 2016-04-06 NOTE — ED Notes (Signed)
Pt opening eyes, becoming restless and bucking vent- increased Versed drip to 2.5 mg/hr.

## 2016-04-06 NOTE — Progress Notes (Signed)
eLink Physician-Brief Progress Note Patient Name: THIRZA PELLICANO DOB: [birth year] MRN: [id number]   Date of Service  04/06/2016  HPI/Events of Note  75 yo female with PMH of ESRD on HD. Presents with chest pain, HTN and pulmonary edema. Intubated and ventilated. BP controlled with NTG IV infusion. Mangement per hospitalist.   eICU Interventions  Will order: 1. Pepcid 20 mg IV now and Q 12 hours.      Intervention Category Evaluation Type: New Patient Evaluation  Lysle Dingwall 04/06/2016, 6:03 AM

## 2016-04-06 NOTE — ED Provider Notes (Signed)
[facility] DEPT Provider Note   CSN: [id number] Arrival date & time: 04/06/16  0030  By signing my name below, I, Shawna [name], attest that this documentation has been prepared under the direction and in the presence of Rolland Porter, MD. Electronically Signed: Irene [name], ED Scribe. 04/06/16. 12:56 AM.  Time seen 12:49 AM  History   Chief Complaint Chief Complaint  Patient presents with  . Chest Pain   The history is provided by the patient. No language interpreter was used.  HPI Comments: Shawna [name] is a 75 y.o. female with a hx of coronary and carotid artery disease, CHF, colon and ovarian cancer, 4 stent placements, CABG, ESRD on dialysis, MI, HTN, and TIA who presents to the Emergency Department complaining of sudden onset, constant, tight left sided chest pain that radiates down the left arm onset 2 hours ago. She states at 11 PM she lay down to go to bed and she started getting the pain. She reports associated nause and mild SOB. Pt said the pain lasted about an hour. Pt took 81 mg aspirin and 2 nitroglycerin to mild relief, however on the way to the ED the chest pain returned and she took the third nitroglycerin and the pain left. Pt currently denies pain but has mild discomfort. She denies hx of pain similar to this. Pt has dialysis MWF at [facility] in [place]. She notes that her BP has been elevated over the past few weeks, despite taking her HTN medications. Pt's BP is currently 232/92. She denies smoking or drinking. She denies diaphoresis or vomiting.  She states she had mild shortness of breath, mild nausea without vomiting. She states she felt hot but was not diaphoretic.  PCP Dr Nadara Mustard Cardiology Dr Jacinta Shoe Nephrology Dr Lowanda Foster  Past Medical History:  Diagnosis Date  . Anxiety   . Arthritis   . AVM (arteriovenous malformation) of colon   . CAD (coronary artery disease)    a. 12/2011 NSTEMI/Cath/PCI LCX (2.25x14 Resolute DES) & D1 (2.25x22 Resolute DES);  b. 01/2012  Cath/PCI: LM 30, LAD 30p, 40-48m, D1 stent ok, 99 in sm branch of diag, LCX patent stent, OM1 20, RCA 95 ost (4.0x12 Promus DES), EF 55%;  c. 04/2012 Lexi Cardiolite  EF 48%, small area of scar @ base/mid inflat wall with mild peri-infarct ischemia.; CABG 12/4  . Carotid artery disease ([place])    a. 09-38% LICA, 07/8297   . Chronic bronchitis ([place])   . Chronic diastolic CHF (congestive heart failure) ([place])    a. 02/2012 Echo EF 60-65%, nl wall motion, Gr 1 DD, mod MR  . Colon cancer ([place]) 1992  . Esophageal stricture   . ESRD on hemodialysis ([place])    ESRD due to HTN, started dialysis 2011 and gets HD at [facility] with Dr Hinda Lenis on MWF schedule.  Access is LUA AVF as of Sept 2014.   Marland Kitchen GERD (gastroesophageal reflux disease)   . High cholesterol 12/2011  . History of blood transfusion 07/2011; 12/2011; 01/2012 X 2; 04/2012  . History of gout   . History of lower GI bleeding   . Hypertension   . Iron deficiency anemia   . Mitral regurgitation    a. Moderate by echo, 02/2012  . Myocardial infarction ([place])   . Ovarian cancer ([place]) 1992  . Pneumonia ~ 2009  . PUD (peptic ulcer disease)   . TIA (transient ischemic attack)     Patient Active Problem List   Diagnosis Date Noted  . Acute  pulmonary edema ([place]) 04/06/2016  . Acute respiratory failure ([place]) 04/06/2016  . Hypertensive crisis 01/27/2016  . History of colon cancer 01/27/2016  . History of ovarian cancer 01/27/2016  . Hypertensive urgency 01/27/2016  . Narrow complex tachycardia ([place]) 09/08/2015  . SVT (supraventricular tachycardia) ([place]) 09/08/2015  . Influenza A 08/30/2015  . Acute on chronic diastolic CHF (congestive heart failure) ([place]) 05/04/2015  . Unstable angina ([place]) 05/03/2015  . Essential hypertension   . Pain in joint, lower leg 08/14/2014  . Chest pain 11/26/2013  . Small bowel obstruction, partial ([place]) 05/29/2013  . Chronic diastolic CHF (congestive heart failure) ([place]) 03/22/2013  . GI bleeding 03/21/2013  .  Acute blood loss anemia 03/21/2013  . Vaginal odor 03/12/2013  . Vaginal discharge 03/12/2013  . Occlusion and stenosis of carotid artery without mention of cerebral infarction 01/24/2013  . Hx of CABG 07/05/2012  . Carotid artery disease ([place]) 07/05/2012  . Anemia of chronic kidney failure 07/03/2012  . Secondary hyperparathyroidism ([place]) 07/03/2012  . Mitral regurgitation 06/12/2012  . Pneumonia 06/09/2012  . Non-STEMI (non-ST elevated myocardial infarction) ([place]) 06/08/2012  . GERD (gastroesophageal reflux disease) 01/09/2012  . HLD (hyperlipidemia) 01/05/2012  . Coronary atherosclerosis of native coronary artery 12/16/2011  . Essential hypertension, benign 12/16/2011  . ESRD on hemodialysis ([place]) 12/16/2011    Past Surgical History:  Procedure Laterality Date  . ABDOMINAL HYSTERECTOMY  1992  . APPENDECTOMY  06/1990  . AV FISTULA PLACEMENT  07/2009   left upper arm  . COLON RESECTION  1992  . CORONARY ANGIOPLASTY WITH STENT PLACEMENT  12/15/11   "2"  . CORONARY ANGIOPLASTY WITH STENT PLACEMENT  y/2013   "1; makes total of 3" (05/02/2012)  . CORONARY ARTERY BYPASS GRAFT  06/13/2012   Procedure: CORONARY ARTERY BYPASS GRAFTING (CABG);  Surgeon: Grace Isaac, MD;  Location: [facility];  Service: Open Heart Surgery;  Laterality: N/A;  cabg x four;  using left internal mammary artery, and left leg greater saphenous vein harvested endoscopically  . DILATION AND CURETTAGE OF UTERUS    . ESOPHAGOGASTRODUODENOSCOPY  01/20/2012   Procedure: ESOPHAGOGASTRODUODENOSCOPY (EGD);  Surgeon: Ladene Artist, MD,FACG;  Location: [facility] ENDOSCOPY;  Service: Endoscopy;  Laterality: N/A;  . ESOPHAGOGASTRODUODENOSCOPY N/A 03/26/2013   Procedure: ESOPHAGOGASTRODUODENOSCOPY (EGD);  Surgeon: Shawna Shipper, MD;  Location: [facility] ENDOSCOPY;  Service: Endoscopy;  Laterality: N/A;  . ESOPHAGOGASTRODUODENOSCOPY N/A 04/30/2015   Procedure: ESOPHAGOGASTRODUODENOSCOPY (EGD);  Surgeon: Rogene Houston, MD;  [facility];  Service: Endoscopy;  Laterality: N/A;  1pm - moved to 10/20 @ 1:10  . INTRAOPERATIVE TRANSESOPHAGEAL ECHOCARDIOGRAM  06/13/2012   Procedure: INTRAOPERATIVE TRANSESOPHAGEAL ECHOCARDIOGRAM;  Surgeon: Grace Isaac, MD;  Location: [place] [facility];  Service: Open Heart Surgery;  Laterality: N/A;  . LEFT HEART CATHETERIZATION WITH CORONARY ANGIOGRAM N/A 12/15/2011   Procedure: LEFT HEART CATHETERIZATION WITH CORONARY ANGIOGRAM;  Surgeon: Burnell Blanks, MD;  Location: [facility] CATH LAB;  Service: Cardiovascular;  Laterality: N/A;  . LEFT HEART CATHETERIZATION WITH CORONARY ANGIOGRAM N/A 01/10/2012   Procedure: LEFT HEART CATHETERIZATION WITH CORONARY ANGIOGRAM;  Surgeon: Peter M Martinique, MD;  Location: [facility] CATH LAB;  Service: Cardiovascular;  Laterality: N/A;  . LEFT HEART CATHETERIZATION WITH CORONARY ANGIOGRAM N/A 06/08/2012   Procedure: LEFT HEART CATHETERIZATION WITH CORONARY ANGIOGRAM;  Surgeon: Burnell Blanks, MD;  Location: [facility] CATH LAB;  Service: Cardiovascular;  Laterality: N/A;  . LEFT HEART CATHETERIZATION WITH CORONARY/GRAFT ANGIOGRAM N/A 12/10/2013   Procedure: LEFT HEART CATHETERIZATION WITH CORONARY/GRAFT ANGIOGRAM;  Surgeon: [name], MD;  Location: [facility] CATH LAB;  Service: Cardiovascular;  Laterality: N/A;  . OVARY SURGERY     ovarian cancer  . SHUNTOGRAM N/A 10/15/2013   Procedure: Fistulogram;  Surgeon: Serafina Mitchell, MD;  Location: [facility] CATH LAB;  Service: Cardiovascular;  Laterality: N/A;  . THROMBECTOMY / ARTERIOVENOUS GRAFT REVISION  2011   left upper arm  . TUBAL LIGATION  1980's    OB History    Gravida Para Term Preterm AB Living   2 2   2   2    SAB TAB Ectopic Multiple Live Births                  Home Medications    Prior to Admission medications   Medication Sig Start Date End Date Taking? Authorizing Provider  ALPRAZolam Duanne Moron) 0.25 MG tablet Take 1-2 tablets by mouth at bedtime as needed for sleep.  11/19/15   Historical Provider, MD  amiodarone  (PACERONE) 200 MG tablet Take 1 tablet (200 mg total) by mouth daily. 09/23/15   Herminio Commons, MD  aspirin EC 81 MG tablet Take 81 mg by mouth daily.     Historical Provider, MD  bacitracin ophthalmic ointment  12/08/15   Historical Provider, MD  cloNIDine (CATAPRES) 0.3 MG tablet Take 1 tablet (0.3 mg total) by mouth daily. *May take an additional tablet based on blood pressure levels* 01/28/16   Rexene Alberts, MD  fluticasone [facility]) 50 MCG/ACT nasal spray Place 2 sprays into the nose daily.     Historical Provider, MD  folic acid-vitamin b complex-vitamin c-selenium-zinc (DIALYVITE) 3 MG TABS Take 1 tablet by mouth daily.    Historical Provider, MD  hydrALAZINE (APRESOLINE) 50 MG tablet Take 3 tablets (150 mg total) by mouth 2 (two) times daily. 01/28/16   Rexene Alberts, MD  isosorbide dinitrate (ISORDIL) 30 MG tablet Take 1 tablet (30 mg total) by mouth 3 (three) times daily. 07/28/15   Herminio Commons, MD  labetalol (NORMODYNE) 200 MG tablet Take 1.5 tablets (300 mg total) by mouth 2 (two) times daily. 06/17/15   Herminio Commons, MD  lanthanum (FOSRENOL) 1000 MG chewable tablet Chew 1,000 mg by mouth 3 (three) times daily after meals.    Historical Provider, MD  NITROSTAT 0.4 MG SL tablet DISSOLVE ONE TABLET UNDER THE TONGUE EVERY 5 MINUTES AS NEEDED FOR CHEST PAIN.  DO NOT EXCEED A TOTAL OF 3 DOSES IN 15 MINUTES AS NEEDED 01/20/16   Herminio Commons, MD  omeprazole (PRILOSEC) 20 MG capsule Take 1 capsule (20 mg total) by mouth daily. 05/12/15   Butch Penny, NP  ondansetron (ZOFRAN-ODT) 4 MG disintegrating tablet Take 4 mg by mouth every 8 (eight) hours as needed for nausea or vomiting. nausea 05/18/15   Historical Provider, MD  PROAIR HFA 108 (90 BASE) MCG/ACT inhaler Inhale 1 puff into the lungs every 6 (six) hours as needed for wheezing or shortness of breath.  01/09/15   Historical Provider, MD  SENSIPAR 30 MG tablet Take 30 mg by mouth daily with supper.  12/21/12   Historical  Provider, MD  simvastatin (ZOCOR) 20 MG tablet Take 1 tablet (20 mg total) by mouth at bedtime. 04/04/16   Lendon Colonel, NP    Family History Family History  Problem Relation Age of Onset  . Heart disease Mother     Heart Disease before age 16  . Hyperlipidemia Mother   . Hypertension Mother   . Diabetes  Mother   . Heart attack Mother   . Heart disease Father     Heart Disease before age 30  . Hyperlipidemia Father   . Hypertension Father   . Diabetes Father   . Diabetes Sister   . Hypertension Sister   . Diabetes Brother   . Hyperlipidemia Brother   . Heart attack Brother   . Hypertension Sister   . Heart attack Brother   . Other      noncontributory for early CAD  . Colon cancer Neg Hx   . Esophageal cancer Neg Hx   . Liver disease Neg Hx   . Kidney disease Neg Hx   . Colon polyps Neg Hx     Social History Social History  Substance Use Topics  . Smoking status: Never Smoker  . Smokeless tobacco: Never Used  . Alcohol use No  Lives at home Lives with spouse   Allergies   Aspirin; Contrast media [iodinated diagnostic agents]; Iron; Penicillins; Plavix [clopidogrel bisulfate]; Amlodipine; Bactrim [sulfamethoxazole-trimethoprim]; Dexilant [dexlansoprazole]; Morphine and related; Nitrofurantoin; Prilosec [omeprazole]; Venofer [ferric oxide]; Levaquin [levofloxacin in d5w]; and Protonix [pantoprazole sodium]   Review of Systems Review of Systems  Constitutional: Negative for diaphoresis.  Respiratory: Positive for shortness of breath.   Cardiovascular: Positive for chest pain.  Gastrointestinal: Positive for nausea. Negative for vomiting.  All other systems reviewed and are negative.  Physical Exam Updated Vital Signs ED Triage Vitals  Enc Vitals Group     BP 04/06/16 0043 (!) 232/92     Pulse Rate 04/06/16 0043 77     Resp 04/06/16 0043 16     Temp 04/06/16 0043 98.5 F (36.9 C)     Temp Source 04/06/16 0043 Oral     SpO2 04/06/16 0043 94 %      Weight 04/06/16 0041 133 lb (60.3 kg)     Height 04/06/16 0041 5\' 1"  (1.549 m)     Head Circumference --      Peak Flow --      Pain Score 04/06/16 0041 4     Pain Loc --      Pain Edu? --      Excl. in Stem? --    Vital signs normal except for hypertension    Physical Exam  Constitutional: She is oriented to person, place, and time. She appears well-developed and well-nourished.  Non-toxic appearance. She does not appear ill. No distress.  HENT:  Head: Normocephalic and atraumatic.  Right Ear: External ear normal.  Left Ear: External ear normal.  Nose: Nose normal. No mucosal edema or rhinorrhea.  Mouth/Throat: Oropharynx is clear and moist and mucous membranes are normal. No dental abscesses or uvula swelling.  Eyes: Conjunctivae and EOM are normal. Pupils are equal, round, and reactive to light.  Neck: Normal range of motion and full passive range of motion without pain. Neck supple.  Cardiovascular: Normal rate, regular rhythm and normal heart sounds.  Exam reveals no gallop and no friction rub.   No murmur heard. Pulmonary/Chest: Effort normal and breath sounds normal. No respiratory distress. She has no wheezes. She has no rhonchi. She has no rales. She exhibits no tenderness and no crepitus.  Abdominal: Soft. Normal appearance and bowel sounds are normal. She exhibits no distension. There is no tenderness. There is no rebound and no guarding.  Musculoskeletal: Normal range of motion. She exhibits no edema or tenderness.  Moves all extremities well.   Neurological: She is alert and oriented to person, place, and  time. She has normal strength. No cranial nerve deficit.  Skin: Skin is warm, dry and intact. No rash noted. No erythema. No pallor.  Psychiatric: She has a normal mood and affect. Her speech is normal and behavior is normal. Her mood appears not anxious.  Nursing note and vitals reviewed.    ED Treatments / Results  Labs (all labs ordered are listed, but only  abnormal results are displayed) Results for orders placed or performed during the hospital encounter of 77/82/42  Basic metabolic panel  Result Value Ref [facility]   Sodium 138 135 - 145 mmol/L   Potassium 4.7 3.5 - 5.1 mmol/L   Chloride 95 (L) 101 - 111 mmol/L   CO2 28 22 - 32 mmol/L   Glucose, Bld 113 (H) 65 - 99 mg/dL   BUN 48 (H) 6 - 20 mg/dL   Creatinine, Ser 10.13 (H) 0.44 - 1.00 mg/dL   Calcium 10.0 8.9 - 10.3 mg/dL   GFR calc non Af Amer 3 (L) >60 mL/min   GFR calc Af Amer 4 (L) >60 mL/min   Anion gap 15 5 - 15  CBC  Result Value Ref Range   WBC 7.5 4.0 - 10.5 K/uL   RBC 2.85 (L) 3.87 - 5.11 MIL/uL   Hemoglobin 8.9 (L) 12.0 - 15.0 g/dL   HCT 28.3 (L) 36.0 - 46.0 %   MCV 99.3 78.0 - 100.0 fL   MCH 31.2 26.0 - 34.0 pg   MCHC 31.4 30.0 - 36.0 g/dL   RDW 17.9 (H) 11.5 - 15.5 %   Platelets 276 150 - 400 K/uL  I-stat troponin, ED  Result Value Ref Range   Troponin i, poc 0.01 0.00 - 0.08 ng/mL   Comment 3           Laboratory interpretation all normal except mild worsening of her anemia, renal failure with normal potassium   #1  ED ECG REPORT   Date: 04/06/2016 at 12:42 AM  Rate: 78  Rhythm: normal sinus rhythm  QRS Axis: right  Intervals: normal  ST/T Wave abnormalities: nonspecific ST/T changes  Conduction Disutrbances:LVH  Narrative Interpretation:   Old EKG Reviewed: unchanged from 23 Mar 2016  I have personally reviewed the EKG tracing and agree with the computerized printout as noted.   EKG  # 2  EKG Interpretation  Date/Time:  Wednesday April 06 2016 01:36:54 EDT Ventricular Rate:  84 PR Interval:    QRS Duration: 99 QT Interval:  414 QTC Calculation: 490 R Axis:   47 Text Interpretation:  Sinus rhythm Left ventricular hypertrophy Anterior Q waves, possibly due to LVH Nonspecific T abnormalities, lateral leads Baseline wander in lead(s) V3 No significant change since last tracing EARLIER SAME DATE Confirmed by Leeasia Secrist  MD-I, Pebble Botkin ([id number]) on 04/06/2016  1:47:18 AM       Radiology Dg Chest 2 View  Result Date: 04/06/2016 CLINICAL DATA:  Acute onset LEFT chest pain radiating to arm 2 hours ago, nausea. History of myocardial infarction, hypertension and end-stage renal disease on dialysis. EXAM: CHEST  2 VIEW COMPARISON:  Chest radiograph February 24, 2016 FINDINGS: The cardiac silhouette is moderately enlarged. Status post median sternotomy for CABG. Coronary artery stents noted. Increasing bronchitic changes and mild interstitial prominence. Bandlike densities in the LEFT midlung zone, bilateral lung bases persist. Slight blunting of the RIGHT costophrenic angle. No pneumothorax. Residual enteric contrast. Osseous structures are unchanged. IMPRESSION: Stable cardiomegaly. Increasing bronchitic changes and interstitial prominence can be seen with CHF or atypical  infection without focal consolidation. Stable LEFT midlung zone, bibasilar atelectasis/scarring. Electronically Signed   By: Elon Alas M.D.   On: 04/06/2016 03:26   Dg Chest Portable 1 View  Result Date: 04/06/2016 CLINICAL DATA:  75 year old female with respiratory distress. EXAM: PORTABLE CHEST 1 VIEW COMPARISON:  Chest radiograph dated 04/06/2016 FINDINGS: There has been interval placement of an endotracheal tube the tip approximately 2.5 cm above the carina. There has been interval development of diffuse bilateral airspace densities most prominent on the right which may represent ARDS, pulmonary edema, and less likely pneumonia given rapid interval progression. Clinical correlation recommended. No significant pleural effusion. No pneumothorax. The cardiac silhouette is within normal limits. Median sternotomy wires and CABG surgery noted. No acute osseous pathology. IMPRESSION: Endotracheal tube above the carina. Interval development of diffuse airspace densities. Electronically Signed   By: Anner Crete M.D.   On: 04/06/2016 04:56    Procedures Procedures (including critical care  time)  INTUBATION Performed by: Janice Norrie  Required items: required blood products, implants, devices, and special equipment available Patient identity confirmed: provided demographic data and hospital-assigned identification number Time out: Immediately prior to procedure a "time out" was called to verify the correct patient, procedure, equipment, support staff and site/side marked as required.  Indications: respiratory failure  Intubation method: Glidescope Laryngoscopy   Preoxygenation: 100 % BVM and Bipap  Sedatives: 20 mgEtomidate Paralytic: 80 mg rocuronium  Tube Size: 7.5 cuffed  Patient noted to have white foamy fluid at the opening of the trachea which was suctioned  Post-procedure assessment: chest rise and ETCO2 monitor Breath sounds: equal and absent over the epigastrium Tube secured with: ETT holder Chest x-ray interpreted by radiologist and me.  Chest x-ray findings: endotracheal tube in appropriate position  Patient tolerated the procedure well with no immediate complications.  She was started on a Versed drip for sedation after her intubation.    Medications Ordered in ED Medications  furosemide (LASIX) 10 MG/ML injection (not administered)  labetalol (NORMODYNE,TRANDATE) injection 20 mg (20 mg Intravenous Given 04/06/16 0335)  midazolam (VERSED) 50 mg in sodium chloride 0.9 % 50 mL (1 mg/mL) infusion (2.5 mg/hr Intravenous Transfusing/Transfer 04/06/16 0512)  nitroGLYCERIN 50 mg in dextrose 5 % 250 mL (0.2 mg/mL) infusion (15 mcg/min Intravenous Transfusing/Transfer 04/06/16 0513)  albuterol (PROVENTIL) (2.5 MG/3ML) 0.083% nebulizer solution 2.5 mg (2.5 mg Nebulization Given 04/06/16 0222)  furosemide (LASIX) injection 100 mg (100 mg Intravenous Given 04/06/16 0309)  labetalol (NORMODYNE,TRANDATE) injection 20 mg (20 mg Intravenous Given 04/06/16 0310)     Initial Impression / Assessment and Plan / ED Course  COORDINATION OF CARE:   I have reviewed the  triage vital signs and the nursing notes.  Pertinent labs & imaging results that were available during my care of the patient were reviewed by me and considered in my medical decision making (see chart for details).  Clinical Course   12:54 AM-Discussed treatment plan which includes labs, EKG, and x-ray with pt at bedside and pt agreed to plan. Patient was started on a nitroglycerin drip to help control her chest pain and her hypertension.  01:50 AM pt states she still has some chest tightness. BP is still very elevated  Nurse reports about 2 AM patient got short of breath walking back from bathroom. She was given albuterol nebulizer which she states normally helps her shortness of breath. She is still very hypertensive and is on NTG at 10 mcg/min.  Nurse advised to continue titrating the NTG up  to control her BP.   Patient got progressively more short of breath, she was getting diaphoretic and had audible rales. She was placed on BiPAP.  02:58 AM Dr Lowanda Foster, informed patient will need emergent dialysis  03:04 AM Dr Darrick Meigs, admit to ICU  Patient indicated her breathing was "a little bit better. She no longer has audible rales however she does have diffuse rhonchi and heavy work of breathing on the BiPAP. However my concern was her work of breathing and having to have dialysis. Husband states she's been intubated in the past temporarily. At this point is felt patient needed to be intubated until she could have her dialysis and hopefully get taken off the ventilator rapidly. Her blood pressure was still over 025 systolic on a nitroglycerin drip at 60 mcg/m. She was then given labetalol and her heart rate improved from 130 to the 90 range. Her blood pressure improved to 852 systolic. After intubation her blood pressure started returning up to the 220 level. She was started on a labetalol drip.  03:50 AM BP 179/115 after 2nd bolus of labetalol, and starting on drip now.   Final Clinical  Impressions(s) / ED Diagnoses   Final diagnoses:  Hypertensive crisis  Acute pulmonary edema ([place])  ESRD needing dialysis [facility])   Plan admission for emergent dialysis for acute pulmonary edema  CRITICAL CARE Performed by: Elijha Dedman L Miles Leyda Total critical care time: 40 minutes Critical care time was exclusive of separately billable procedures and treating other patients. Critical care was necessary to treat or prevent imminent or life-threatening deterioration. Critical care was time spent personally by me on the following activities: development of treatment plan with patient and/or surrogate as well as nursing, discussions with consultants, evaluation of patient's response to treatment, examination of patient, obtaining history from patient or surrogate, ordering and performing treatments and interventions, ordering and review of laboratory studies, ordering and review of radiographic studies, pulse oximetry and re-evaluation of patient's condition.    I personally performed the services described in this documentation, which was scribed in my presence. The recorded information has been reviewed and considered.  Rolland Porter, MD, Barbette Or, MD 04/06/16 [phone]

## 2016-04-06 NOTE — ED Notes (Signed)
Attempted IV access x2 with no success.

## 2016-04-06 NOTE — Progress Notes (Signed)
Placed on cpap mode for possible extubation. Pt needing stimulation and constant reminder at this time to breath. Rt remained in room with patient for 30 mins. I had to place patient back in full support for a brief 5 mins. Patient almost immediately arroused and seemed wide awake. I then placed her back in CPAP/PS and patient doing well at this time. RT will remain close by for monitoring.

## 2016-04-06 NOTE — Procedures (Signed)
   EMERGENT HEMODIALYSIS TREATMENT NOTE:  3.5 hour low-heparin dialysis completed via left upper arm AVF (16g ante/retrograde). Goal NOT met:  Unable to tolerate removal of 3 liters as ordered. Ultrafiltration was interrupted for a total of 50 minutes and UF rate had to be decreased twice due to sudden episodes of hypotension.  Net UF 2.4 liters.  All blood was returned and hemostasis was achieved within 15 minutes. Report given to [facility], RN.  Rockwell Alexandria, RN, CDN

## 2016-04-06 NOTE — Progress Notes (Signed)
Pt intubated per MD with a 7.5 ET tube secured at 21 @ lip.  Equal BBS and positive CO2 color change.  Pt placed on vent.  RT will continue to monitor.

## 2016-04-06 NOTE — ED Notes (Signed)
20 mg of etomidate given @ 0325 80 mg Rocuronium given @ 0326 Both given  via IV site; right wrist

## 2016-04-06 NOTE — ED Triage Notes (Signed)
Pt c/o left side chest pain that radiates down left arm; pt took 81mg  aspirin and 3 nitroglycerin at home pta; pt states the pain is somewhat relieved since taking the medications

## 2016-04-06 NOTE — ED Notes (Signed)
IV drips titrated per protocol- currently running as follows: Labetalol - 0.5 mg/min Nitroglycerin -  10 mcg/min Versed  -  2 mg/hr

## 2016-04-06 NOTE — ED Notes (Signed)
MD at bedside. Dr Lowanda Foster

## 2016-04-06 NOTE — Care Management Important Message (Signed)
Important Message  Patient Details  Name: THANDIWE SIRAGUSA MRN: [id number] Date of Birth: 08-May-1941   Medicare Important Message Given:  Yes    Sherald Barge, RN 04/06/2016, 2:03 PM

## 2016-04-06 NOTE — ED Notes (Signed)
MD at bedside. 

## 2016-04-06 NOTE — Progress Notes (Signed)
Extubation Procedure Note  Patient Details:   Name: Shawna [name] DOB: 1940-11-08 MRN: [id number]   Airway Documentation:  Airway 7.5 mm (Active)  Secured at (cm) 22 cm 04/06/2016 10:45 AM  Measured From Lips 04/06/2016 10:45 AM  Secured Location Left 04/06/2016 10:45 AM  Secured By [employer] 04/06/2016 10:45 AM  Tube Holder Repositioned Yes 04/06/2016 10:45 AM  Cuff Pressure (cm H2O) 20 cm H2O 04/06/2016  5:48 AM  Site Condition Dry 04/06/2016 10:45 AM    Evaluation  O2 sats: 992 Complications:  no complications noted Patient did tolerate procedure well. Bilateral Breath Sounds: Diminished and clear   Patient in hoarse at this time but able to speak clearly. Hr 80, SPO2 100, RR 21. Pt placed on 3lpm Blanca post extubation. RT will cont to monitor patient.   Lucianne Muss 04/06/2016, 1:19 PM

## 2016-04-07 DIAGNOSIS — I160 Hypertensive urgency: Secondary | ICD-10-CM

## 2016-04-07 DIAGNOSIS — J9601 Acute respiratory failure with hypoxia: Principal | ICD-10-CM

## 2016-04-07 DIAGNOSIS — J810 Acute pulmonary edema: Secondary | ICD-10-CM

## 2016-04-07 DIAGNOSIS — Z992 Dependence on renal dialysis: Secondary | ICD-10-CM

## 2016-04-07 DIAGNOSIS — N186 End stage renal disease: Secondary | ICD-10-CM

## 2016-04-07 LAB — HEPATITIS B SURFACE ANTIGEN: HEP B S AG: NEGATIVE

## 2016-04-07 MED ORDER — SIMVASTATIN 20 MG PO TABS
20.0000 mg | ORAL_TABLET | Freq: Every day | ORAL | Status: DC
  Filled 2016-04-07 (×2): qty 1

## 2016-04-07 MED ORDER — LABETALOL HCL 200 MG PO TABS
200.0000 mg | ORAL_TABLET | Freq: Two times a day (BID) | ORAL | Status: DC
  Administered 2016-04-07 (×2): 200 mg via ORAL
  Filled 2016-04-07 (×3): qty 1

## 2016-04-07 MED ORDER — ISOSORBIDE DINITRATE 20 MG PO TABS
30.0000 mg | ORAL_TABLET | Freq: Three times a day (TID) | ORAL | Status: DC
  Administered 2016-04-07 – 2016-04-08 (×4): 30 mg via ORAL
  Filled 2016-04-07: qty 1
  Filled 2016-04-07 (×3): qty 2

## 2016-04-07 MED ORDER — CINACALCET HCL 30 MG PO TABS
30.0000 mg | ORAL_TABLET | Freq: Every day | ORAL | Status: DC
  Administered 2016-04-07: 30 mg via ORAL
  Filled 2016-04-07 (×4): qty 1

## 2016-04-07 MED ORDER — ALPRAZOLAM 0.25 MG PO TABS
0.2500 mg | ORAL_TABLET | Freq: Once | ORAL | Status: AC
  Administered 2016-04-07: 0.25 mg via ORAL
  Filled 2016-04-07: qty 1

## 2016-04-07 MED ORDER — EPOETIN ALFA 3000 UNIT/ML IJ SOLN
3000.0000 [IU] | INTRAMUSCULAR | Status: DC
  Filled 2016-04-07: qty 1

## 2016-04-07 MED ORDER — CLONIDINE HCL 0.1 MG PO TABS
0.1000 mg | ORAL_TABLET | Freq: Two times a day (BID) | ORAL | Status: DC
  Administered 2016-04-07 – 2016-04-08 (×2): 0.1 mg via ORAL
  Filled 2016-04-07 (×3): qty 1

## 2016-04-07 MED ORDER — LANTHANUM CARBONATE 500 MG PO CHEW
1000.0000 mg | CHEWABLE_TABLET | Freq: Three times a day (TID) | ORAL | Status: DC
  Administered 2016-04-07 (×3): 1000 mg via ORAL
  Filled 2016-04-07 (×14): qty 2

## 2016-04-07 NOTE — Progress Notes (Signed)
Subjective: Interval History: has no complaint of chest pain or difficulty breathing. She is feeling much better..  Objective: Vital signs in last 24 hours: Temp:  [97.1 F (36.2 C)-99.2 F (37.3 C)] 98.4 F (36.9 C) (09/28 0733) Pulse Rate:  [64-82] 71 (09/28 0700) Resp:  [9-22] 22 (09/28 0700) BP: (117-199)/(47-125) 157/59 (09/28 0700) SpO2:  [100 %] 100 % (09/28 0700) FiO2 (%):  [40 %] 40 % (09/27 1045) Weight:  [61 kg (134 lb 7.7 oz)] 61 kg (134 lb 7.7 oz) (09/28 0400) Weight change: 0.672 kg (1 lb 7.7 oz)  Intake/Output from previous day: 09/27 0701 - 09/28 0700 In: 58.3 [I.V.:8.3; IV Piggyback:50] Out: 2413  Intake/Output this shift: No intake/output data recorded.  General appearance: alert, cooperative and no distress Resp: clear to auscultation bilaterally Cardio: regular rate and rhythm Extremities: extremities normal, atraumatic, no cyanosis or edema  Lab Results:  Recent Labs  04/06/16 0111  WBC 7.5  HGB 8.9*  HCT 28.3*  PLT 276   BMET:  Recent Labs  04/06/16 0111 04/06/16 0548  NA 138 137  K 4.7 5.6*  CL 95* 96*  CO2 28 26  GLUCOSE 113* 141*  BUN 48* 53*  CREATININE 10.13* 10.27*  CALCIUM 10.0 9.9   No results for input(s): PTH in the last 72 hours. Iron Studies: No results for input(s): IRON, TIBC, TRANSFERRIN, FERRITIN in the last 72 hours.  Studies/Results: Dg Chest 2 View  Result Date: 04/06/2016 CLINICAL DATA:  Acute onset LEFT chest pain radiating to arm 2 hours ago, nausea. History of myocardial infarction, hypertension and end-stage renal disease on dialysis. EXAM: CHEST  2 VIEW COMPARISON:  Chest radiograph February 24, 2016 FINDINGS: The cardiac silhouette is moderately enlarged. Status post median sternotomy for CABG. Coronary artery stents noted. Increasing bronchitic changes and mild interstitial prominence. Bandlike densities in the LEFT midlung zone, bilateral lung bases persist. Slight blunting of the RIGHT costophrenic angle. No  pneumothorax. Residual enteric contrast. Osseous structures are unchanged. IMPRESSION: Stable cardiomegaly. Increasing bronchitic changes and interstitial prominence can be seen with CHF or atypical infection without focal consolidation. Stable LEFT midlung zone, bibasilar atelectasis/scarring. Electronically Signed   By: Elon Alas M.D.   On: 04/06/2016 03:26   Dg Chest Portable 1 View  Result Date: 04/06/2016 CLINICAL DATA:  75 year old female with respiratory distress. EXAM: PORTABLE CHEST 1 VIEW COMPARISON:  Chest radiograph dated 04/06/2016 FINDINGS: There has been interval placement of an endotracheal tube the tip approximately 2.5 cm above the carina. There has been interval development of diffuse bilateral airspace densities most prominent on the right which may represent ARDS, pulmonary edema, and less likely pneumonia given rapid interval progression. Clinical correlation recommended. No significant pleural effusion. No pneumothorax. The cardiac silhouette is within normal limits. Median sternotomy wires and CABG surgery noted. No acute osseous pathology. IMPRESSION: Endotracheal tube above the carina. Interval development of diffuse airspace densities. Electronically Signed   By: Anner Crete M.D.   On: 04/06/2016 04:56    I have reviewed the patient's current medications.  Assessment/Plan: Problem #1 flash pulmonary edema: She status post hemodialysis yesterday with fluid removal. Presently patient is extubated and feels much better. Problem #2: Hypertension: Labile. Presently she is off nitro drip. Her blood pressure seems to have improved. Problem #3 metabolic bone disease: Her calcium is a range but her phosphorus is slightly high. She is on a binder Problem #4 chest pain: Has improved and presently she is asymptomatic Problem #5. End-stage renal disease: She is  status post hemodialysis yesterday. Her potassium is good and she doesn't have any uremic signs and  symptoms. Problem #6 anemia: Her hemoglobin is range. Plan: 1] We'll continue his present management 2] if blood pressure is not controlled probably will add amlodipine 5 mg once a day which patient was supposed to be on. 3] we'll make arrangements for patient to get dialysis tomorrow.   LOS: 1 day   Elon Eoff S 04/07/2016,8:22 AM

## 2016-04-07 NOTE — Progress Notes (Signed)
Report given to 300 Dept. Nurse and patient transferred via wheelchair to 300.

## 2016-04-07 NOTE — Progress Notes (Signed)
15cc/15mg  of versed wasted in sink with Patricia Nettle and [employer] account planner.

## 2016-04-07 NOTE — Progress Notes (Signed)
PROGRESS NOTE    LIVIER HENDEL  CZY:[id number] DOB: 01/06/1941 DOA: 04/06/2016 PCP: [name], MD    Brief Narrative:  75 yo with Hx of ESRD, HTN cardiomyopathy, known CAD, admitted with HTN pulmonary edema, likely aggravated by volume overload, required intubation.  Her K was 5.7, Cr of 10, bicarb 26.  She was dialyzed yesterday, placed on IV NTG, and extubated yesterday.  She is now on RA, BP 160, alert, orient, and is doing well.  Assessment & Plan:   Active Problems:   Coronary atherosclerosis of native coronary artery   ESRD on hemodialysis ([place])   Hypertensive urgency   Acute pulmonary edema (HCC)   Acute respiratory failure ([place])   1. Hypoxic respiratory failure:  From HTN cardiomyopathy and volume overload.  She is now doing well, extubated.  Will transfer her to the floor on telemetry.  Increase ambulation.  Will resume her home meds.  Needs to get BP under perfect controlled.  2. HTN:  Will resume her clonidine, transdate, at a little lower dose than home dose.  Titrate as needed. 3. ESRD:  Per nephrology.  I plan to d/c her home tomorrow.  Resume Sensipar and fosrenol. 4. CAD:  Stable.  Resume betablocker and nitrates orally.   DVT prophylaxis: heparin SQ. Code Status: FULL CODE>  Family Communication: patient only. Disposition Plan: To home with husband of 47 years.   Consultants:   Nephrology.   Procedures:   Urgent dialysis.   Antimicrobials: Anti-infectives    None       Subjective:  Feeling better.  No SOB.  No CP>   Objective: Vitals:   04/07/16 0500 04/07/16 0600 04/07/16 0700 04/07/16 0733  BP: (!) 158/47 (!) 149/55 (!) 157/59   Pulse: 65 73 71   Resp: 15 (!) 21 (!) 22   Temp:    98.4 F (36.9 C)  TempSrc:    Oral  SpO2: 100% 100% 100%   Weight:      Height:        Intake/Output Summary (Last 24 hours) at 04/07/16 0747 Last data filed at 04/06/16 1045  Gross per 24 hour  Intake            58.25 ml  Output             2413 ml  Net          -2354.75 ml   Filed Weights   04/06/16 0041 04/06/16 0536 04/07/16 0400  Weight: 60.3 kg (133 lb) 62.5 kg (137 lb 12.6 oz) 61 kg (134 lb 7.7 oz)    Examination:  General exam: Appears calm and comfortable  Respiratory system: Clear to auscultation. Respiratory effort normal. Cardiovascular system: S1 & S2 heard, RRR. No JVD, murmurs, rubs, gallops or clicks. No pedal edema. Gastrointestinal system: Abdomen is nondistended, soft and nontender. No organomegaly or masses felt. Normal bowel sounds heard. Central nervous system: Alert and oriented. No focal neurological deficits. Extremities: Symmetric 5 x 5 power. Skin: No rashes, lesions or ulcers Psychiatry: Judgement and insight appear normal. Mood & affect appropriate.   Data Reviewed: I have personally reviewed following labs and imaging studies  CBC:  Recent Labs Lab 04/06/16 0111  WBC 7.5  HGB 8.9*  HCT 28.3*  MCV 99.3  PLT 093   Basic Metabolic Panel:  Recent Labs Lab 04/06/16 0111 04/06/16 0548  NA 138 137  K 4.7 5.6*  CL 95* 96*  CO2 28 26  GLUCOSE 113* 141*  BUN 48*  53*  CREATININE 10.13* 10.27*  CALCIUM 10.0 9.9  PHOS  --  5.7*   GFR: Estimated Creatinine Clearance: 3.9 mL/min (by C-G formula based on SCr of 10.27 mg/dL (H)). Liver Function Tests:  Recent Labs Lab 04/06/16 0548  AST 22  ALT 15  ALKPHOS 58  BILITOT 0.7  PROT 6.3*  ALBUMIN 3.5     Recent Results (from the past 240 hour(s))  MRSA PCR Screening     Status: None   Collection Time: 04/06/16  5:10 AM  Result Value Ref Range Status   MRSA by PCR NEGATIVE NEGATIVE Final    Comment:        The GeneXpert MRSA Assay (FDA approved for NASAL specimens only), is one component of a comprehensive MRSA colonization surveillance program. It is not intended to diagnose MRSA infection nor to guide or monitor treatment for MRSA infections.      Radiology Studies: Dg Chest 2 View  Result Date: 04/06/2016 CLINICAL DATA:   Acute onset LEFT chest pain radiating to arm 2 hours ago, nausea. History of myocardial infarction, hypertension and end-stage renal disease on dialysis. EXAM: CHEST  2 VIEW COMPARISON:  Chest radiograph February 24, 2016 FINDINGS: The cardiac silhouette is moderately enlarged. Status post median sternotomy for CABG. Coronary artery stents noted. Increasing bronchitic changes and mild interstitial prominence. Bandlike densities in the LEFT midlung zone, bilateral lung bases persist. Slight blunting of the RIGHT costophrenic angle. No pneumothorax. Residual enteric contrast. Osseous structures are unchanged. IMPRESSION: Stable cardiomegaly. Increasing bronchitic changes and interstitial prominence can be seen with CHF or atypical infection without focal consolidation. Stable LEFT midlung zone, bibasilar atelectasis/scarring. Electronically Signed   By: Elon Alas M.D.   On: 04/06/2016 03:26   Dg Chest Portable 1 View  Result Date: 04/06/2016 CLINICAL DATA:  75 year old female with respiratory distress. EXAM: PORTABLE CHEST 1 VIEW COMPARISON:  Chest radiograph dated 04/06/2016 FINDINGS: There has been interval placement of an endotracheal tube the tip approximately 2.5 cm above the carina. There has been interval development of diffuse bilateral airspace densities most prominent on the right which may represent ARDS, pulmonary edema, and less likely pneumonia given rapid interval progression. Clinical correlation recommended. No significant pleural effusion. No pneumothorax. The cardiac silhouette is within normal limits. Median sternotomy wires and CABG surgery noted. No acute osseous pathology. IMPRESSION: Endotracheal tube above the carina. Interval development of diffuse airspace densities. Electronically Signed   By: Anner Crete M.D.   On: 04/06/2016 04:56    Scheduled Meds: . cinacalcet  30 mg Oral Q supper  . cloNIDine  0.1 mg Oral BID  . epoetin (EPOGEN/PROCRIT) injection  4,000 Units  Intravenous Q M,W,F-HD  . famotidine (PEPCID) IV  20 mg Intravenous Q24H  . heparin  5,000 Units Subcutaneous Q8H  . isosorbide dinitrate  30 mg Oral TID  . labetalol  200 mg Oral BID  . lanthanum  1,000 mg Oral TID WC  . simvastatin  20 mg Oral q1800  . sodium chloride flush  3 mL Intravenous Q12H   Continuous Infusions: . midazolam (VERSED) infusion Stopped (04/06/16 1045)     LOS: 1 day   Amarise Lillo, MD FACP Hospitalist.   If 7PM-7AM, please contact night-coverage [email or web address] Password [facility] 04/07/2016, 7:47 AM

## 2016-04-08 LAB — RENAL FUNCTION PANEL
ANION GAP: 13 (ref 5–15)
Albumin: 3.2 g/dL — ABNORMAL LOW (ref 3.5–5.0)
BUN: 65 mg/dL — AB (ref 6–20)
CHLORIDE: 95 mmol/L — AB (ref 101–111)
CO2: 27 mmol/L (ref 22–32)
Calcium: 9 mg/dL (ref 8.9–10.3)
Creatinine, Ser: 11.33 mg/dL — ABNORMAL HIGH (ref 0.44–1.00)
GFR, EST AFRICAN AMERICAN: 3 mL/min — AB (ref >60)
GFR, EST NON AFRICAN AMERICAN: 3 mL/min — AB (ref >60)
Glucose, Bld: 90 mg/dL (ref 65–99)
PHOSPHORUS: 5.4 mg/dL — AB (ref 2.5–4.6)
POTASSIUM: 4.7 mmol/L (ref 3.5–5.1)
Sodium: 135 mmol/L (ref 135–145)

## 2016-04-08 MED ORDER — EPOETIN ALFA 4000 UNIT/ML IJ SOLN
INTRAMUSCULAR | Status: AC
  Filled 2016-04-08: qty 1

## 2016-04-08 MED ORDER — EPOETIN ALFA 10000 UNIT/ML IJ SOLN
10000.0000 [IU] | INTRAMUSCULAR | Status: DC
  Administered 2016-04-08: 10000 [IU] via INTRAVENOUS

## 2016-04-08 MED ORDER — LANTHANUM CARBONATE 1000 MG PO CHEW: 1000.0000 mg | CHEWABLE_TABLET | Freq: Three times a day (TID) | ORAL | 1 refills | Status: DC

## 2016-04-08 MED ORDER — EPOETIN ALFA 10000 UNIT/ML IJ SOLN: 10000.0000 [IU] | INTRAMUSCULAR | 10 refills | Status: DC

## 2016-04-08 MED ORDER — NIFEDIPINE ER OSMOTIC RELEASE 30 MG PO TB24: 30.0000 mg | ORAL_TABLET | Freq: Every day | ORAL | Status: DC

## 2016-04-08 MED ORDER — FAMOTIDINE 20 MG PO TABS
20.0000 mg | ORAL_TABLET | Freq: Every day | ORAL | Status: DC
Start: 2016-04-09 — End: 2016-04-08
  Administered 2016-04-08: 20 mg via ORAL
  Filled 2016-04-08: qty 1

## 2016-04-08 MED ORDER — SODIUM CHLORIDE 0.9 % IV SOLN: 100.0000 mL | INTRAVENOUS | Status: DC | PRN

## 2016-04-08 MED ORDER — LABETALOL HCL 200 MG PO TABS: 200.0000 mg | ORAL_TABLET | Freq: Two times a day (BID) | ORAL | 1 refills | Status: DC

## 2016-04-08 MED ORDER — CLONIDINE HCL 0.1 MG PO TABS: 0.1000 mg | ORAL_TABLET | Freq: Two times a day (BID) | ORAL | 11 refills | Status: DC

## 2016-04-08 NOTE — Discharge Summary (Signed)
Physician Discharge Summary  Shawna [name]:[id number] DOB: 02-13-1941 DOA: 04/06/2016  PCP: [name], MD  Admit date: 04/06/2016 Discharge date: 04/08/2016  Admitted From: Home.  Disposition:  To home.   Recommendations for Outpatient Follow-up:  1. Follow up with PCP in 1-2 weeks 2. Follow up with Dr Chilton Greathouse in one week.   Home Health: None.  Equipment/Devices:None.  Discharge Condition: improved.  BP controlled.  No SOB.  Just had dialysis.  CODE STATUS: FULL CODE.  Diet recommendation: Renal and Carb modified diet.   Brief/Interim Summary:  Patient was admitted for hypoxic respiratory failure requiring intubation by Dr Frederich Chick on Sept 27, 2017/  As per his prior H and P:  "  [name]  is a 75 y.o. female, With history of ESRD on hemodialysis Monday Wednesday Friday, coronary artery disease, diastolic heart failure, colon cancer, ovarian cancer, hypertension who came to the hospital for chest pain and shortness of breath. Patient was in flash pulmonary edema with hypertensive urgency , started on nitroglycerin infusion , on BiPAP and plan for emergent dialysis today. Nephrology was consulted by ED physician. Patient is unable to provide any history at this time as she is on BiPAP and in moderate respiratory distress.  No history of nausea or vomiting. No fever  HOSPITAL COURSE:  Patient was intubated, and admitted into the ICU, after transient bipap was proved not helpful.  She was started on IV NTG drip, and IV Versed was given.  She was seen by Dr Chilton Greathouse and was urgently dialyzed.  Her weaning parameters were good, and she was extubated uneventfully that afternoon.  She was kept in the ICU, and her was able to tolerate her diet.  Her oxygenation improved, and she was wean off.  She was restarted on her oral anti HTN meds, but with adjustments.  Her Clonidine was restarted at 01mg  BID instead of 0,3mg  TID.  Her hydralazine was not restarted.  Her Imdur was restarted, and her  labetelol was resumed at 200mg  BID instead of 300mg  TID.  With these, her BP ranges from 200 down to 120.  Her BPs are very very labile, and during dialysis, her SBP was 200, responded to 120 with just one dose of clonidine at 0.1mg .  Because of that, her Procardia XL was not given at this time.  Attempt was to chose her anti HTN meds that are longer acting for her, to avoid sudden drop of her BP.  She will follow up with her PCP next week, and to follow up with her nephrology next week as well.    Discharge Diagnoses:  Active Problems:   Coronary atherosclerosis of native coronary artery   ESRD on hemodialysis ([place])   Hypertensive urgency   Acute pulmonary edema (HCC)   Acute respiratory failure [facility])    Discharge Instructions  Discharge Instructions    Diet - low sodium heart healthy    Complete by:  As directed    Discharge instructions    Complete by:  As directed    Please take your BP meds.  Follow up with your doctor.  Your BP goes up and down, and if you feel "not well", please check your BP.   Increase activity slowly    Complete by:  As directed        Medication List    STOP taking these medications   ALLERGY PO   hydrALAZINE 50 MG tablet Commonly known as:  APRESOLINE   NITROSTAT 0.4 MG SL tablet  Generic drug:  nitroGLYCERIN     TAKE these medications   ALPRAZolam 0.25 MG tablet Commonly known as:  XANAX Take 1-2 tablets by mouth at bedtime as needed for sleep.   amiodarone 200 MG tablet Commonly known as:  PACERONE Take 1 tablet (200 mg total) by mouth daily.   aspirin EC 81 MG tablet Take 81 mg by mouth daily.   cloNIDine 0.1 MG tablet Commonly known as:  CATAPRES Take 1 tablet (0.1 mg total) by mouth 2 (two) times daily. What changed:  medication strength  how much to take  when to take this  additional instructions   epoetin alfa 10000 UNIT/ML injection Commonly known as:  EPOGEN,PROCRIT Inject 1 mL (10,000 Units total) into the vein  every Monday, Wednesday, and Friday with hemodialysis. Start taking on:  04/11/2016   fluticasone 50 MCG/ACT nasal spray Commonly known as:  FLONASE Place 2 sprays into the nose daily.   isosorbide dinitrate 30 MG tablet Commonly known as:  ISORDIL Take 1 tablet (30 mg total) by mouth 3 (three) times daily.   labetalol 200 MG tablet Commonly known as:  NORMODYNE Take 1 tablet (200 mg total) by mouth 2 (two) times daily. What changed:  how much to take   lanthanum 1000 MG chewable tablet Commonly known as:  FOSRENOL Chew 1 tablet (1,000 mg total) by mouth 3 (three) times daily with meals. What changed:  when to take this   multivitamin Tabs tablet Take 1 tablet by mouth daily.   omeprazole 20 MG capsule Commonly known as:  PRILOSEC Take 1 capsule (20 mg total) by mouth daily.   PROAIR HFA 108 (90 Base) MCG/ACT inhaler Generic drug:  albuterol Inhale 1 puff into the lungs every 6 (six) hours as needed for wheezing or shortness of breath.   SENSIPAR 30 MG tablet Generic drug:  cinacalcet Take 30 mg by mouth daily with supper.   simvastatin 20 MG tablet Commonly known as:  ZOCOR Take 1 tablet (20 mg total) by mouth at bedtime.       Allergies  Allergen Reactions  . Aspirin Other (See Comments)    Mess up her stomach; "makes my bowels have blood in them". Takes 81 mg EC Aspirin   . Contrast Media [Iodinated Diagnostic Agents] Itching  . Iron Itching and Other (See Comments)    "they gave me iron in dialysis; had to give me Benadryl cause I had to have the iron" (05/02/2012)  . Penicillins Other (See Comments)    "makes me real weak when I take it; like I'll pass out"Has patient had a PCN reaction causing immediate rash, facial/tongue/throat swelling, SOB or lightheadedness with hypotension: {  . Plavix [Clopidogrel Bisulfate] Rash  . Amlodipine Swelling    Leg swelling  . Bactrim [Sulfamethoxazole-Trimethoprim] Rash  . Dexilant [Dexlansoprazole] Other (See Comments)     Upset stomach  . Morphine And Related Itching    Itching in feet  . Nitrofurantoin Hives  . Prilosec [Omeprazole] Other (See Comments)    "back spasms"  . Venofer [Ferric Oxide] Itching    Patient reports using Benadryl prior to doses as [facility]  . Levaquin [Levofloxacin In D5w] Rash  . Protonix [Pantoprazole Sodium] Rash    Consultations:  Nephrology.    Procedures/Studies: Dg Chest 2 View  Result Date: 04/06/2016 CLINICAL DATA:  Acute onset LEFT chest pain radiating to arm 2 hours ago, nausea. History of myocardial infarction, hypertension and end-stage renal disease on dialysis. EXAM: CHEST  2 VIEW COMPARISON:  Chest radiograph February 24, 2016 FINDINGS: The cardiac silhouette is moderately enlarged. Status post median sternotomy for CABG. Coronary artery stents noted. Increasing bronchitic changes and mild interstitial prominence. Bandlike densities in the LEFT midlung zone, bilateral lung bases persist. Slight blunting of the RIGHT costophrenic angle. No pneumothorax. Residual enteric contrast. Osseous structures are unchanged. IMPRESSION: Stable cardiomegaly. Increasing bronchitic changes and interstitial prominence can be seen with CHF or atypical infection without focal consolidation. Stable LEFT midlung zone, bibasilar atelectasis/scarring. Electronically Signed   By: Elon Alas M.D.   On: 04/06/2016 03:26   Dg Chest Portable 1 View  Result Date: 04/06/2016 CLINICAL DATA:  75 year old female with respiratory distress. EXAM: PORTABLE CHEST 1 VIEW COMPARISON:  Chest radiograph dated 04/06/2016 FINDINGS: There has been interval placement of an endotracheal tube the tip approximately 2.5 cm above the carina. There has been interval development of diffuse bilateral airspace densities most prominent on the right which may represent ARDS, pulmonary edema, and less likely pneumonia given rapid interval progression. Clinical correlation recommended. No significant pleural  effusion. No pneumothorax. The cardiac silhouette is within normal limits. Median sternotomy wires and CABG surgery noted. No acute osseous pathology. IMPRESSION: Endotracheal tube above the carina. Interval development of diffuse airspace densities. Electronically Signed   By: Anner Crete M.D.   On: 04/06/2016 04:56       Subjective: Feeling very well today.    Discharge Exam: Vitals:   04/08/16 1410 04/08/16 1425  BP: (!) 218/109 (!) 219/97  Pulse: 72 72  Resp:  16  Temp:  98 F (36.7 C)   Vitals:   04/08/16 1345 04/08/16 1400 04/08/16 1410 04/08/16 1425  BP: (!) 220/102 (!) 220/112 (!) 218/109 (!) 219/97  Pulse: 71 73 72 72  Resp:    16  Temp:    98 F (36.7 C)  TempSrc:    Oral  SpO2:      Weight:      Height:        General: Pt is alert, awake, not in acute distress Cardiovascular: RRR, S1/S2 +, no rubs, no gallops Respiratory: CTA bilaterally, no wheezing, no rhonchi Abdominal: Soft, NT, ND, bowel sounds + Extremities: no edema, no cyanosis    The results of significant diagnostics from this hospitalization (including imaging, microbiology, ancillary and laboratory) are listed below for reference.     Microbiology: Recent Results (from the past 240 hour(s))  MRSA PCR Screening     Status: None   Collection Time: 04/06/16  5:10 AM  Result Value Ref Range Status   MRSA by PCR NEGATIVE NEGATIVE Final    Comment:        The GeneXpert MRSA Assay (FDA approved for NASAL specimens only), is one component of a comprehensive MRSA colonization surveillance program. It is not intended to diagnose MRSA infection nor to guide or monitor treatment for MRSA infections.      Labs: BNP (last 3 results)  Recent Labs  05/04/15 0518  BNP 3,267.1*   Basic Metabolic Panel:  Recent Labs Lab 04/06/16 0111 04/06/16 0548 04/08/16 0608  NA 138 137 135  K 4.7 5.6* 4.7  CL 95* 96* 95*  CO2 28 26 27   GLUCOSE 113* 141* 90  BUN 48* 53* 65*  CREATININE  10.13* 10.27* 11.33*  CALCIUM 10.0 9.9 9.0  PHOS  --  5.7* 5.4*   Liver Function Tests:  Recent Labs Lab 04/06/16 0548 04/08/16 0608  AST 22  --   ALT 15  --  ALKPHOS 58  --   BILITOT 0.7  --   PROT 6.3*  --   ALBUMIN 3.5 3.2*   CBC:  Recent Labs Lab 04/06/16 0111  WBC 7.5  HGB 8.9*  HCT 28.3*  MCV 99.3  PLT 276   Urinalysis    Component Value Date/Time   COLORURINE YELLOW 12/16/2012 1919   APPEARANCEUR CLOUDY (A) 12/16/2012 1919   LABSPEC 1.009 12/16/2012 1919   PHURINE 7.5 12/16/2012 1919   GLUCOSEU NEGATIVE 12/16/2012 1919   HGBUR TRACE (A) 12/16/2012 1919   BILIRUBINUR NEGATIVE 12/16/2012 1919   KETONESUR NEGATIVE 12/16/2012 1919   PROTEINUR 100 (A) 12/16/2012 1919   UROBILINOGEN 0.2 12/16/2012 1919   NITRITE NEGATIVE 12/16/2012 1919   LEUKOCYTESUR SMALL (A) 12/16/2012 1919   Sepsis Labs Invalid input(s): PROCALCITONIN,  WBC,  LACTICIDVEN Microbiology Recent Results (from the past 240 hour(s))  MRSA PCR Screening     Status: None   Collection Time: 04/06/16  5:10 AM  Result Value Ref Range Status   MRSA by PCR NEGATIVE NEGATIVE Final    Comment:        The GeneXpert MRSA Assay (FDA approved for NASAL specimens only), is one component of a comprehensive MRSA colonization surveillance program. It is not intended to diagnose MRSA infection nor to guide or monitor treatment for MRSA infections.      Time coordinating discharge: Over 30 minutes  SIGNED:   Orvan Falconer, MD FACP [facility] 04/08/2016, 3:32 PM   If 7PM-7AM, please contact night-coverage [email or web address] Password [email or web address]

## 2016-04-08 NOTE — Care Management Important Message (Signed)
Important Message  Patient Details  Name: DMYA LONG MRN: [id number] Date of Birth: Nov 23, 1940   Medicare Important Message Given:  Yes    Adrienne Trombetta, Chauncey Reading, RN 04/08/2016, 2:09 PM

## 2016-04-08 NOTE — Procedures (Signed)
    HEMODIALYSIS TREATMENT NOTE:  3.5 hour low-heparin dialysis completed via left upper arm AVF (16g ante/retrograde). Goal NOT met:  Ultrafiltration was interrupted x 25 minutes due to symptomatic hypotension.  All antihypertensives had been held prior to HD.  Pt became increasingly concerned about rising BP.  She reported that she usually takes anti-hypertensives prior to outpatient HD and insisted, "It [BP]'s only going to keep going up."  She requested a Clonidine 0.1 mg.  30 minutes later, SBP dropped to 125 and pt became lethargic and lightheaded.  UF was stopped, pt was reclined flat, and NS bolus was given.  Within 20 minutes, symptoms had completely resolved and SBP was back up to 190-200.  Ultrafiltration was resumed at a reduced rate.  All blood was returned at end of HD and hemostasis was achieved within 20 minutes. Net UF 2.6 liters. Pt seen on HD by Dr. Marin Comment. Report called to Sharen Hones, RN.  Rockwell Alexandria, RN, CDN

## 2016-04-08 NOTE — Progress Notes (Signed)
Subjective: Interval History: Patient presently denies any chest pain. She denies also any difficulty breathing. Overall she feels good.  Objective: Vital signs in last 24 hours: Temp:  [98.5 F (36.9 C)-98.9 F (37.2 C)] 98.9 F (37.2 C) (09/29 0508) Pulse Rate:  [74-88] 82 (09/29 0508) Resp:  [18] 18 (09/28 1434) BP: (161-219)/(70-89) 161/70 (09/29 0508) SpO2:  [97 %-99 %] 99 % (09/29 0508) Weight:  [60.6 kg (133 lb 9.6 oz)] 60.6 kg (133 lb 9.6 oz) (09/29 0508) Weight change: -0.399 kg (-14.1 oz)  Intake/Output from previous day: 09/28 0701 - 09/29 0700 In: 240 [P.O.:240] Out: -  Intake/Output this shift: No intake/output data recorded.  General appearance: alert, cooperative and no distress Resp: clear to auscultation bilaterally Cardio: regular rate and rhythm Extremities: extremities normal, atraumatic, no cyanosis or edema  Lab Results:  Recent Labs  04/06/16 0111  WBC 7.5  HGB 8.9*  HCT 28.3*  PLT 276   BMET:   Recent Labs  04/06/16 0548 04/08/16 0608  NA 137 135  K 5.6* 4.7  CL 96* 95*  CO2 26 27  GLUCOSE 141* 90  BUN 53* 65*  CREATININE 10.27* 11.33*  CALCIUM 9.9 9.0   No results for input(s): PTH in the last 72 hours. Iron Studies: No results for input(s): IRON, TIBC, TRANSFERRIN, FERRITIN in the last 72 hours.  Studies/Results: No results found.  I have reviewed the patient's current medications.  Assessment/Plan: Problem #1 flash pulmonary edema: She status post hemodialysis yesterday with fluid removal On Wednesday. Presently she remains asymptomatic. Problem #2: Hypertension: Labile. Patient presently on labetalol 200 mg by mouth twice a day and clonidine 0.3 mg by mouth twice a day. Her blood pressure is much better but still high and fluctuating. Problem #3 metabolic bone disease: Her calcium is a range but her phosphorus is slightly high. She is on a binder Problem #4 chest pain: Has improved and presently she is asymptomatic Problem  #5. End-stage renal disease: She is status post hemodialysis on Wednesday. Presently she doesn't have any uremic signs and symptoms. Problem #6 anemia: Her hemoglobin is low and below our target goal.  Plan: 1] We'll continue his present management 2] we'll add Procardia XL 30 mg by mouth daily 3] we'll make arrangements for patient to get dialysis today 4] we will increase Epogen to 10,000 units IV after each dialysis.   LOS: 2 days   Markevion Lattin S 04/08/2016,9:26 AM

## 2016-04-08 NOTE — Progress Notes (Signed)
The patient is receiving PEPCID by the intravenous route.  Based on criteria approved by the Pharmacy and [place], the medication is being converted to the equivalent oral dose form.  These criteria include: -No Active GI bleeding -Able to tolerate diet of full liquids (or better) or tube feeding OR able to tolerate other medications by the oral or enteral route  If you have any questions about this conversion, please contact the Pharmacy Department (ext [phone]).  Thank you.  Ena Dawley, [facility] 04/08/2016 12:56 PM

## 2016-04-09 DIAGNOSIS — N186 End stage renal disease: Secondary | ICD-10-CM | POA: Diagnosis not present

## 2016-04-09 DIAGNOSIS — Z992 Dependence on renal dialysis: Secondary | ICD-10-CM | POA: Diagnosis not present

## 2016-04-11 DIAGNOSIS — N186 End stage renal disease: Secondary | ICD-10-CM | POA: Diagnosis not present

## 2016-04-11 DIAGNOSIS — Z992 Dependence on renal dialysis: Secondary | ICD-10-CM | POA: Diagnosis not present

## 2016-04-13 ENCOUNTER — Ambulatory Visit (INDEPENDENT_AMBULATORY_CARE_PROVIDER_SITE_OTHER): Payer: Medicare HMO | Admitting: Cardiovascular Disease

## 2016-04-13 ENCOUNTER — Encounter: Payer: Self-pay | Admitting: Cardiovascular Disease

## 2016-04-13 VITALS — BP 153/72 | HR 72 | Ht 61.0 in | Wt 131.0 lb

## 2016-04-13 DIAGNOSIS — I6523 Occlusion and stenosis of bilateral carotid arteries: Secondary | ICD-10-CM

## 2016-04-13 DIAGNOSIS — E7800 Pure hypercholesterolemia, unspecified: Secondary | ICD-10-CM

## 2016-04-13 DIAGNOSIS — Z992 Dependence on renal dialysis: Secondary | ICD-10-CM | POA: Diagnosis not present

## 2016-04-13 DIAGNOSIS — I10 Essential (primary) hypertension: Secondary | ICD-10-CM | POA: Diagnosis not present

## 2016-04-13 DIAGNOSIS — N186 End stage renal disease: Secondary | ICD-10-CM

## 2016-04-13 DIAGNOSIS — I25768 Atherosclerosis of bypass graft of coronary artery of transplanted heart with other forms of angina pectoris: Secondary | ICD-10-CM

## 2016-04-13 DIAGNOSIS — I5032 Chronic diastolic (congestive) heart failure: Secondary | ICD-10-CM | POA: Diagnosis not present

## 2016-04-13 DIAGNOSIS — Z9289 Personal history of other medical treatment: Secondary | ICD-10-CM

## 2016-04-13 DIAGNOSIS — Z951 Presence of aortocoronary bypass graft: Secondary | ICD-10-CM

## 2016-04-13 MED ORDER — NITROGLYCERIN 0.4 MG SL SUBL: 0.4000 mg | SUBLINGUAL_TABLET | SUBLINGUAL | 3 refills | Status: DC | PRN

## 2016-04-13 NOTE — Patient Instructions (Addendum)
Medication Instructions:   Nitroglycerin prescription sent to pharmacy today.    Continue all other current medications.  Labwork: none  Testing/Procedures: none  Follow-Up: 3 months   Any Other Special Instructions Will Be Listed Below (If Applicable).  If you need a refill on your cardiac medications before your next appointment, please call your pharmacy.

## 2016-04-13 NOTE — Progress Notes (Signed)
SUBJECTIVE: The patient presents after recently being hospitalized at [facility]. She was admitted for hypoxic respiratory failure requiring intubation on 04/06/16. She had hypertensive urgency and flash pulmonary edema and was given IV nitroglycerin and was urgently dialyzed. Clonidine dose was adjusted. Hydralazine was not restarted. Labetalol dose was also adjusted.  Feels fatigued but energy levels gradually improving.   Review of Systems: As per "subjective", otherwise negative.    Current Outpatient Prescriptions  Medication Sig Dispense Refill  . ALPRAZolam (XANAX) 0.25 MG tablet Take 1-2 tablets by mouth at bedtime as needed for sleep.     Marland Kitchen amiodarone (PACERONE) 200 MG tablet Take 1 tablet (200 mg total) by mouth daily. 30 tablet 3  . aspirin EC 81 MG tablet Take 81 mg by mouth daily.     . cloNIDine (CATAPRES) 0.1 MG tablet Take 1 tablet (0.1 mg total) by mouth 2 (two) times daily. 60 tablet 11  . epoetin alfa (EPOGEN,PROCRIT) 06269 UNIT/ML injection Inject 1 mL (10,000 Units total) into the vein every Monday, Wednesday, and Friday with hemodialysis. 1 mL 10  . fluticasone (FLONASE) 50 MCG/ACT nasal spray Place 2 sprays into the nose daily.     . isosorbide dinitrate (ISORDIL) 30 MG tablet Take 1 tablet (30 mg total) by mouth 3 (three) times daily. 90 tablet 6  . labetalol (NORMODYNE) 200 MG tablet Take 1 tablet (200 mg total) by mouth 2 (two) times daily. 90 tablet 1  . lanthanum (FOSRENOL) 1000 MG chewable tablet Chew 1 tablet (1,000 mg total) by mouth 3 (three) times daily with meals. 90 tablet 1  . multivitamin (RENA-VIT) TABS tablet Take 1 tablet by mouth daily.    Marland Kitchen omeprazole (PRILOSEC) 20 MG capsule Take 1 capsule (20 mg total) by mouth daily. 30 capsule 5  . PROAIR HFA 108 (90 BASE) MCG/ACT inhaler Inhale 1 puff into the lungs every 6 (six) hours as needed for wheezing or shortness of breath.     . SENSIPAR 30 MG tablet Take 30 mg by mouth daily with supper.     .  simvastatin (ZOCOR) 20 MG tablet Take 1 tablet (20 mg total) by mouth at bedtime. 90 tablet 3   No current facility-administered medications for this visit.     Past Medical History:  Diagnosis Date  . Anxiety   . Arthritis   . AVM (arteriovenous malformation) of colon   . CAD (coronary artery disease)    a. 12/2011 NSTEMI/Cath/PCI LCX (2.25x14 Resolute DES) & D1 (2.25x22 Resolute DES);  b. 01/2012 Cath/PCI: LM 30, LAD 30p, 40-6m, D1 stent ok, 99 in sm branch of diag, LCX patent stent, OM1 20, RCA 95 ost (4.0x12 Promus DES), EF 55%;  c. 04/2012 Lexi Cardiolite  EF 48%, small area of scar @ base/mid inflat wall with mild peri-infarct ischemia.; CABG 12/4  . Carotid artery disease ([place])    a. 48-54% LICA, 12/2701   . Chronic bronchitis ([place])   . Chronic diastolic CHF (congestive heart failure) ([place])    a. 02/2012 Echo EF 60-65%, nl wall motion, Gr 1 DD, mod MR  . Colon cancer ([place]) 1992  . Esophageal stricture   . ESRD on hemodialysis ([place])    ESRD due to HTN, started dialysis 2011 and gets HD at [facility] with Dr Hinda Lenis on MWF schedule.  Access is LUA AVF as of Sept 2014.   Marland Kitchen GERD (gastroesophageal reflux disease)   . High cholesterol 12/2011  . History of blood transfusion  07/2011; 12/2011; 01/2012 X 2; 04/2012  . History of gout   . History of lower GI bleeding   . Hypertension   . Iron deficiency anemia   . Mitral regurgitation    a. Moderate by echo, 02/2012  . Myocardial infarction   . Ovarian cancer ([place]) 1992  . Pneumonia ~ 2009  . PUD (peptic ulcer disease)   . TIA (transient ischemic attack)     Past Surgical History:  Procedure Laterality Date  . ABDOMINAL HYSTERECTOMY  1992  . APPENDECTOMY  06/1990  . AV FISTULA PLACEMENT  07/2009   left upper arm  . COLON RESECTION  1992  . CORONARY ANGIOPLASTY WITH STENT PLACEMENT  12/15/11   "2"  . CORONARY ANGIOPLASTY WITH STENT PLACEMENT  y/2013   "1; makes total of 3" (05/02/2012)  . CORONARY ARTERY BYPASS GRAFT  06/13/2012    Procedure: CORONARY ARTERY BYPASS GRAFTING (CABG);  Surgeon: Grace Isaac, MD;  Location: [place];  Service: Open Heart Surgery;  Laterality: N/A;  cabg x four;  using left internal mammary artery, and left leg greater saphenous vein harvested endoscopically  . DILATION AND CURETTAGE OF UTERUS    . ESOPHAGOGASTRODUODENOSCOPY  01/20/2012   Procedure: ESOPHAGOGASTRODUODENOSCOPY (EGD);  Surgeon: Ladene Artist, MD,FACG;  Location: [facility] ENDOSCOPY;  Service: Endoscopy;  Laterality: N/A;  . ESOPHAGOGASTRODUODENOSCOPY N/A 03/26/2013   Procedure: ESOPHAGOGASTRODUODENOSCOPY (EGD);  Surgeon: Irene Shipper, MD;  Location[facility] ENDOSCOPY;  Service: Endoscopy;  Laterality: N/A;  . ESOPHAGOGASTRODUODENOSCOPY N/A 04/30/2015   Procedure: ESOPHAGOGASTRODUODENOSCOPY (EGD);  Surgeon: Rogene Houston, MD;  Location[facility];  Service: Endoscopy;  Laterality: N/A;  1pm - moved to 10/20 @ 1:10  . INTRAOPERATIVE TRANSESOPHAGEAL ECHOCARDIOGRAM  06/13/2012   Procedure: INTRAOPERATIVE TRANSESOPHAGEAL ECHOCARDIOGRAM;  Surgeon: Grace Isaac, MD;  Location: [place];  Service: Open Heart Surgery;  Laterality: N/A;  . LEFT HEART CATHETERIZATION WITH CORONARY ANGIOGRAM N/A 12/15/2011   Procedure: LEFT HEART CATHETERIZATION WITH CORONARY ANGIOGRAM;  Surgeon: Burnell Blanks, MD;  Location: [facility] CATH LAB;  Service: Cardiovascular;  Laterality: N/A;  . LEFT HEART CATHETERIZATION WITH CORONARY ANGIOGRAM N/A 01/10/2012   Procedure: LEFT HEART CATHETERIZATION WITH CORONARY ANGIOGRAM;  Surgeon: Peter M Martinique, MD;  Location: [facility] CATH LAB;  Service: Cardiovascular;  Laterality: N/A;  . LEFT HEART CATHETERIZATION WITH CORONARY ANGIOGRAM N/A 06/08/2012   Procedure: LEFT HEART CATHETERIZATION WITH CORONARY ANGIOGRAM;  Surgeon: Burnell Blanks, MD;  Location: [facility] CATH LAB;  Service: Cardiovascular;  Laterality: N/A;  . LEFT HEART CATHETERIZATION WITH CORONARY/GRAFT ANGIOGRAM N/A 12/10/2013   Procedure: LEFT HEART CATHETERIZATION WITH  Beatrix Fetters;  Surgeon: [name], MD;  Location: [facility] CATH LAB;  Service: Cardiovascular;  Laterality: N/A;  . OVARY SURGERY     ovarian cancer  . SHUNTOGRAM N/A 10/15/2013   Procedure: Fistulogram;  Surgeon: Serafina Mitchell, MD;  Location: [facility] CATH LAB;  Service: Cardiovascular;  Laterality: N/A;  . THROMBECTOMY / ARTERIOVENOUS GRAFT REVISION  2011   left upper arm  . TUBAL LIGATION  1980's    Social History   Social History  . Marital status: Married    Spouse name: N/A  . Number of children: N/A  . Years of education: N/A   Occupational History  . Not on file.   Social History Main Topics  . Smoking status: Never Smoker  . Smokeless tobacco: Never Used  . Alcohol use No  . Drug use: No  . Sexual activity: Yes    Birth control/  protection: Surgical   Other Topics Concern  . Not on file   Social History Narrative   Lives in [place], [place] with husband.  Dialysis pt - mwf.     Vitals:   04/13/16 1526  BP: (!) 153/72  Pulse: 72  Weight: 131 lb (59.4 kg)  Height: 5\' 1"  (1.549 m)    PHYSICAL EXAM General: NAD HEENT: Normal. Neck: No JVD, no thyromegaly. Lungs: Clear to auscultation bilaterally with normal respiratory effort. CV: Nondisplaced PMI.  Regular rate and rhythm, normal S1/S2, no S3/S4, soft aortic systolic murmur. No pretibial or periankle edema.    Abdomen: Soft, nontender, no distention.  Neurologic: Alert and oriented.  Psych: Normal affect. Skin: Normal. Musculoskeletal: No gross deformities.    ECG: Most recent ECG reviewed.      ASSESSMENT AND PLAN: 1. CAD/CABG: Normal nuclear stress test (08/25/14). Continue aspirin, nitrates, labetalol, and statin. Will refill SL nitro.  2. Malignant HTN: BP mildly elevated. Recent medication adjustments while hospitalized. No changes.  3. Hyperlipidemia: Continue statin therapy.  4. Mitral regurgitation: Moderate in 09/2015. Will monitor.  5. Carotid artery stenosis: Less than  40% bilaterally on 08/14/14. Continue surveillance monitoring.   6. PSVT: Stable on amiodarone. No changes.  Dispo: f/u 3 months.   Kate Sable, M.D., F.A.C.C.

## 2016-04-14 ENCOUNTER — Other Ambulatory Visit (INDEPENDENT_AMBULATORY_CARE_PROVIDER_SITE_OTHER): Payer: Self-pay | Admitting: Internal Medicine

## 2016-04-15 DIAGNOSIS — N186 End stage renal disease: Secondary | ICD-10-CM | POA: Diagnosis not present

## 2016-04-15 DIAGNOSIS — Z992 Dependence on renal dialysis: Secondary | ICD-10-CM | POA: Diagnosis not present

## 2016-04-18 DIAGNOSIS — N186 End stage renal disease: Secondary | ICD-10-CM | POA: Diagnosis not present

## 2016-04-18 DIAGNOSIS — Z992 Dependence on renal dialysis: Secondary | ICD-10-CM | POA: Diagnosis not present

## 2016-04-20 DIAGNOSIS — Z992 Dependence on renal dialysis: Secondary | ICD-10-CM | POA: Diagnosis not present

## 2016-04-20 DIAGNOSIS — N186 End stage renal disease: Secondary | ICD-10-CM | POA: Diagnosis not present

## 2016-04-22 DIAGNOSIS — N186 End stage renal disease: Secondary | ICD-10-CM | POA: Diagnosis not present

## 2016-04-22 DIAGNOSIS — Z992 Dependence on renal dialysis: Secondary | ICD-10-CM | POA: Diagnosis not present

## 2016-04-25 ENCOUNTER — Telehealth: Payer: Self-pay | Admitting: Cardiovascular Disease

## 2016-04-25 DIAGNOSIS — N186 End stage renal disease: Secondary | ICD-10-CM | POA: Diagnosis not present

## 2016-04-25 DIAGNOSIS — Z992 Dependence on renal dialysis: Secondary | ICD-10-CM | POA: Diagnosis not present

## 2016-04-25 NOTE — Telephone Encounter (Signed)
Pt called stating her BP has been going up and down for the past few days now and would like for a nurse to give her a call @ [phone]

## 2016-04-25 NOTE — Telephone Encounter (Signed)
Patient walked into office earlier c/o BP going up & down today at dialysis.  Stated they did make some adjustments to her settings.  Questioned if she let tech know this while she was there & stated they just kept watch on it.  Stated she had chest pressure last evening.  Did not use any NTG.  No c/o chest pain, SOB, or dizziness currently.  Reminded her about proper use of her Nitroglycerin& when to take it.  Also, suggested she bring in log of readings to give MD for review.

## 2016-04-27 DIAGNOSIS — N186 End stage renal disease: Secondary | ICD-10-CM | POA: Diagnosis not present

## 2016-04-27 DIAGNOSIS — Z992 Dependence on renal dialysis: Secondary | ICD-10-CM | POA: Diagnosis not present

## 2016-04-28 DIAGNOSIS — Z6825 Body mass index (BMI) 25.0-25.9, adult: Secondary | ICD-10-CM | POA: Diagnosis not present

## 2016-04-28 DIAGNOSIS — R197 Diarrhea, unspecified: Secondary | ICD-10-CM | POA: Diagnosis not present

## 2016-04-28 DIAGNOSIS — J069 Acute upper respiratory infection, unspecified: Secondary | ICD-10-CM | POA: Diagnosis not present

## 2016-04-28 DIAGNOSIS — R1084 Generalized abdominal pain: Secondary | ICD-10-CM | POA: Diagnosis not present

## 2016-04-28 DIAGNOSIS — R1110 Vomiting, unspecified: Secondary | ICD-10-CM | POA: Diagnosis not present

## 2016-04-29 DIAGNOSIS — Z992 Dependence on renal dialysis: Secondary | ICD-10-CM | POA: Diagnosis not present

## 2016-04-29 DIAGNOSIS — N186 End stage renal disease: Secondary | ICD-10-CM | POA: Diagnosis not present

## 2016-05-02 DIAGNOSIS — Z992 Dependence on renal dialysis: Secondary | ICD-10-CM | POA: Diagnosis not present

## 2016-05-02 DIAGNOSIS — Z79899 Other long term (current) drug therapy: Secondary | ICD-10-CM | POA: Diagnosis not present

## 2016-05-02 DIAGNOSIS — E785 Hyperlipidemia, unspecified: Secondary | ICD-10-CM | POA: Diagnosis not present

## 2016-05-02 DIAGNOSIS — N186 End stage renal disease: Secondary | ICD-10-CM | POA: Diagnosis not present

## 2016-05-04 DIAGNOSIS — N186 End stage renal disease: Secondary | ICD-10-CM | POA: Diagnosis not present

## 2016-05-04 DIAGNOSIS — Z992 Dependence on renal dialysis: Secondary | ICD-10-CM | POA: Diagnosis not present

## 2016-05-06 DIAGNOSIS — Z992 Dependence on renal dialysis: Secondary | ICD-10-CM | POA: Diagnosis not present

## 2016-05-06 DIAGNOSIS — N186 End stage renal disease: Secondary | ICD-10-CM | POA: Diagnosis not present

## 2016-05-08 ENCOUNTER — Encounter (HOSPITAL_COMMUNITY): Payer: Self-pay | Admitting: *Deleted

## 2016-05-08 ENCOUNTER — Emergency Department (HOSPITAL_COMMUNITY): Payer: Medicare HMO

## 2016-05-08 ENCOUNTER — Inpatient Hospital Stay (HOSPITAL_COMMUNITY)
Admission: EM | Admit: 2016-05-08 | Discharge: 2016-05-26 | DRG: 304 | Disposition: A | Discharge status: Skilled Nursing Facility | Payer: Medicare HMO | Attending: Internal Medicine | Admitting: Internal Medicine

## 2016-05-08 DIAGNOSIS — Z888 Allergy status to other drugs, medicaments and biological substances status: Secondary | ICD-10-CM

## 2016-05-08 DIAGNOSIS — J9690 Respiratory failure, unspecified, unspecified whether with hypoxia or hypercapnia: Secondary | ICD-10-CM

## 2016-05-08 DIAGNOSIS — Z452 Encounter for adjustment and management of vascular access device: Secondary | ICD-10-CM | POA: Diagnosis not present

## 2016-05-08 DIAGNOSIS — Z7189 Other specified counseling: Secondary | ICD-10-CM | POA: Diagnosis not present

## 2016-05-08 DIAGNOSIS — Z9049 Acquired absence of other specified parts of digestive tract: Secondary | ICD-10-CM

## 2016-05-08 DIAGNOSIS — N185 Chronic kidney disease, stage 5: Secondary | ICD-10-CM | POA: Diagnosis not present

## 2016-05-08 DIAGNOSIS — I82611 Acute embolism and thrombosis of superficial veins of right upper extremity: Secondary | ICD-10-CM | POA: Diagnosis not present

## 2016-05-08 DIAGNOSIS — E7800 Pure hypercholesterolemia, unspecified: Secondary | ICD-10-CM | POA: Diagnosis present

## 2016-05-08 DIAGNOSIS — I469 Cardiac arrest, cause unspecified: Secondary | ICD-10-CM

## 2016-05-08 DIAGNOSIS — R0602 Shortness of breath: Secondary | ICD-10-CM

## 2016-05-08 DIAGNOSIS — M898X9 Other specified disorders of bone, unspecified site: Secondary | ICD-10-CM | POA: Diagnosis present

## 2016-05-08 DIAGNOSIS — I169 Hypertensive crisis, unspecified: Secondary | ICD-10-CM

## 2016-05-08 DIAGNOSIS — Z90710 Acquired absence of both cervix and uterus: Secondary | ICD-10-CM

## 2016-05-08 DIAGNOSIS — N186 End stage renal disease: Secondary | ICD-10-CM | POA: Diagnosis not present

## 2016-05-08 DIAGNOSIS — R609 Edema, unspecified: Secondary | ICD-10-CM

## 2016-05-08 DIAGNOSIS — R402314 Coma scale, best motor response, none, 24 hours or more after hospital admission: Secondary | ICD-10-CM | POA: Diagnosis not present

## 2016-05-08 DIAGNOSIS — I161 Hypertensive emergency: Secondary | ICD-10-CM | POA: Diagnosis not present

## 2016-05-08 DIAGNOSIS — Z955 Presence of coronary angioplasty implant and graft: Secondary | ICD-10-CM

## 2016-05-08 DIAGNOSIS — I5032 Chronic diastolic (congestive) heart failure: Secondary | ICD-10-CM | POA: Diagnosis not present

## 2016-05-08 DIAGNOSIS — G9340 Encephalopathy, unspecified: Secondary | ICD-10-CM | POA: Diagnosis not present

## 2016-05-08 DIAGNOSIS — R079 Chest pain, unspecified: Secondary | ICD-10-CM | POA: Diagnosis not present

## 2016-05-08 DIAGNOSIS — J9600 Acute respiratory failure, unspecified whether with hypoxia or hypercapnia: Secondary | ICD-10-CM | POA: Diagnosis present

## 2016-05-08 DIAGNOSIS — I132 Hypertensive heart and chronic kidney disease with heart failure and with stage 5 chronic kidney disease, or end stage renal disease: Secondary | ICD-10-CM | POA: Diagnosis present

## 2016-05-08 DIAGNOSIS — R0789 Other chest pain: Secondary | ICD-10-CM

## 2016-05-08 DIAGNOSIS — I5022 Chronic systolic (congestive) heart failure: Secondary | ICD-10-CM | POA: Diagnosis present

## 2016-05-08 DIAGNOSIS — Z95828 Presence of other vascular implants and grafts: Secondary | ICD-10-CM

## 2016-05-08 DIAGNOSIS — Z8673 Personal history of transient ischemic attack (TIA), and cerebral infarction without residual deficits: Secondary | ICD-10-CM

## 2016-05-08 DIAGNOSIS — I2510 Atherosclerotic heart disease of native coronary artery without angina pectoris: Secondary | ICD-10-CM | POA: Diagnosis present

## 2016-05-08 DIAGNOSIS — M6281 Muscle weakness (generalized): Secondary | ICD-10-CM

## 2016-05-08 DIAGNOSIS — F05 Delirium due to known physiological condition: Secondary | ICD-10-CM | POA: Diagnosis not present

## 2016-05-08 DIAGNOSIS — Z880 Allergy status to penicillin: Secondary | ICD-10-CM

## 2016-05-08 DIAGNOSIS — G253 Myoclonus: Secondary | ICD-10-CM | POA: Diagnosis not present

## 2016-05-08 DIAGNOSIS — Z7982 Long term (current) use of aspirin: Secondary | ICD-10-CM

## 2016-05-08 DIAGNOSIS — Z992 Dependence on renal dialysis: Secondary | ICD-10-CM | POA: Diagnosis not present

## 2016-05-08 DIAGNOSIS — K219 Gastro-esophageal reflux disease without esophagitis: Secondary | ICD-10-CM | POA: Diagnosis present

## 2016-05-08 DIAGNOSIS — I468 Cardiac arrest due to other underlying condition: Secondary | ICD-10-CM | POA: Diagnosis not present

## 2016-05-08 DIAGNOSIS — J9601 Acute respiratory failure with hypoxia: Secondary | ICD-10-CM | POA: Diagnosis not present

## 2016-05-08 DIAGNOSIS — R402214 Coma scale, best verbal response, none, 24 hours or more after hospital admission: Secondary | ICD-10-CM | POA: Diagnosis not present

## 2016-05-08 DIAGNOSIS — Z8543 Personal history of malignant neoplasm of ovary: Secondary | ICD-10-CM

## 2016-05-08 DIAGNOSIS — G729 Myopathy, unspecified: Secondary | ICD-10-CM | POA: Diagnosis not present

## 2016-05-08 DIAGNOSIS — N189 Chronic kidney disease, unspecified: Secondary | ICD-10-CM

## 2016-05-08 DIAGNOSIS — Z515 Encounter for palliative care: Secondary | ICD-10-CM

## 2016-05-08 DIAGNOSIS — M7989 Other specified soft tissue disorders: Secondary | ICD-10-CM | POA: Diagnosis not present

## 2016-05-08 DIAGNOSIS — E875 Hyperkalemia: Secondary | ICD-10-CM | POA: Diagnosis not present

## 2016-05-08 DIAGNOSIS — Z4682 Encounter for fitting and adjustment of non-vascular catheter: Secondary | ICD-10-CM | POA: Diagnosis not present

## 2016-05-08 DIAGNOSIS — I252 Old myocardial infarction: Secondary | ICD-10-CM

## 2016-05-08 DIAGNOSIS — M19041 Primary osteoarthritis, right hand: Secondary | ICD-10-CM | POA: Diagnosis not present

## 2016-05-08 DIAGNOSIS — I472 Ventricular tachycardia: Secondary | ICD-10-CM | POA: Diagnosis not present

## 2016-05-08 DIAGNOSIS — I82711 Chronic embolism and thrombosis of superficial veins of right upper extremity: Secondary | ICD-10-CM | POA: Diagnosis present

## 2016-05-08 DIAGNOSIS — Z85038 Personal history of other malignant neoplasm of large intestine: Secondary | ICD-10-CM

## 2016-05-08 DIAGNOSIS — R402114 Coma scale, eyes open, never, 24 hours or more after hospital admission: Secondary | ICD-10-CM | POA: Diagnosis not present

## 2016-05-08 DIAGNOSIS — Z885 Allergy status to narcotic agent status: Secondary | ICD-10-CM

## 2016-05-08 DIAGNOSIS — I248 Other forms of acute ischemic heart disease: Secondary | ICD-10-CM | POA: Diagnosis present

## 2016-05-08 DIAGNOSIS — Z881 Allergy status to other antibiotic agents status: Secondary | ICD-10-CM

## 2016-05-08 DIAGNOSIS — D631 Anemia in chronic kidney disease: Secondary | ICD-10-CM | POA: Diagnosis present

## 2016-05-08 DIAGNOSIS — J42 Unspecified chronic bronchitis: Secondary | ICD-10-CM | POA: Diagnosis present

## 2016-05-08 DIAGNOSIS — Z90721 Acquired absence of ovaries, unilateral: Secondary | ICD-10-CM

## 2016-05-08 DIAGNOSIS — I959 Hypotension, unspecified: Secondary | ICD-10-CM | POA: Diagnosis not present

## 2016-05-08 DIAGNOSIS — Z8249 Family history of ischemic heart disease and other diseases of the circulatory system: Secondary | ICD-10-CM

## 2016-05-08 DIAGNOSIS — Z8701 Personal history of pneumonia (recurrent): Secondary | ICD-10-CM

## 2016-05-08 DIAGNOSIS — J9811 Atelectasis: Secondary | ICD-10-CM | POA: Diagnosis not present

## 2016-05-08 DIAGNOSIS — R34 Anuria and oliguria: Secondary | ICD-10-CM | POA: Diagnosis present

## 2016-05-08 DIAGNOSIS — I120 Hypertensive chronic kidney disease with stage 5 chronic kidney disease or end stage renal disease: Secondary | ICD-10-CM | POA: Diagnosis not present

## 2016-05-08 DIAGNOSIS — R4182 Altered mental status, unspecified: Secondary | ICD-10-CM

## 2016-05-08 DIAGNOSIS — I5042 Chronic combined systolic (congestive) and diastolic (congestive) heart failure: Secondary | ICD-10-CM | POA: Diagnosis present

## 2016-05-08 DIAGNOSIS — Z951 Presence of aortocoronary bypass graft: Secondary | ICD-10-CM

## 2016-05-08 DIAGNOSIS — F419 Anxiety disorder, unspecified: Secondary | ICD-10-CM | POA: Diagnosis present

## 2016-05-08 DIAGNOSIS — Z886 Allergy status to analgesic agent status: Secondary | ICD-10-CM

## 2016-05-08 DIAGNOSIS — I081 Rheumatic disorders of both mitral and tricuspid valves: Secondary | ICD-10-CM | POA: Diagnosis present

## 2016-05-08 DIAGNOSIS — Z91041 Radiographic dye allergy status: Secondary | ICD-10-CM

## 2016-05-08 DIAGNOSIS — G9349 Other encephalopathy: Secondary | ICD-10-CM | POA: Diagnosis not present

## 2016-05-08 DIAGNOSIS — R0902 Hypoxemia: Secondary | ICD-10-CM | POA: Diagnosis not present

## 2016-05-08 LAB — CBC WITH DIFFERENTIAL/PLATELET
Basophils Absolute: 0 10*3/uL (ref 0.0–0.1)
Basophils Relative: 1 %
EOS PCT: 7 %
Eosinophils Absolute: 0.5 10*3/uL (ref 0.0–0.7)
HEMATOCRIT: 34 % — AB (ref 36.0–46.0)
Hemoglobin: 10.5 g/dL — ABNORMAL LOW (ref 12.0–15.0)
LYMPHS ABS: 1.1 10*3/uL (ref 0.7–4.0)
LYMPHS PCT: 14 %
MCH: 31.4 pg (ref 26.0–34.0)
MCHC: 30.9 g/dL (ref 30.0–36.0)
MCV: 101.8 fL — AB (ref 78.0–100.0)
MONO ABS: 0.6 10*3/uL (ref 0.1–1.0)
MONOS PCT: 8 %
Neutro Abs: 5.1 10*3/uL (ref 1.7–7.7)
Neutrophils Relative %: 70 %
PLATELETS: 307 10*3/uL (ref 150–400)
RBC: 3.34 MIL/uL — ABNORMAL LOW (ref 3.87–5.11)
RDW: 17.6 % — AB (ref 11.5–15.5)
WBC: 7.3 10*3/uL (ref 4.0–10.5)

## 2016-05-08 IMAGING — CR DG CHEST 1V PORT
1 series · 1 of 1 positions shown · non-contrast
Comparison: [DATE]

CLINICAL DATA: Chest pain radiating into the back, onset tonight
during bowel movement. Minimal relief with nitroglycerin.

EXAM:
PORTABLE CHEST 1 VIEW

[portable]
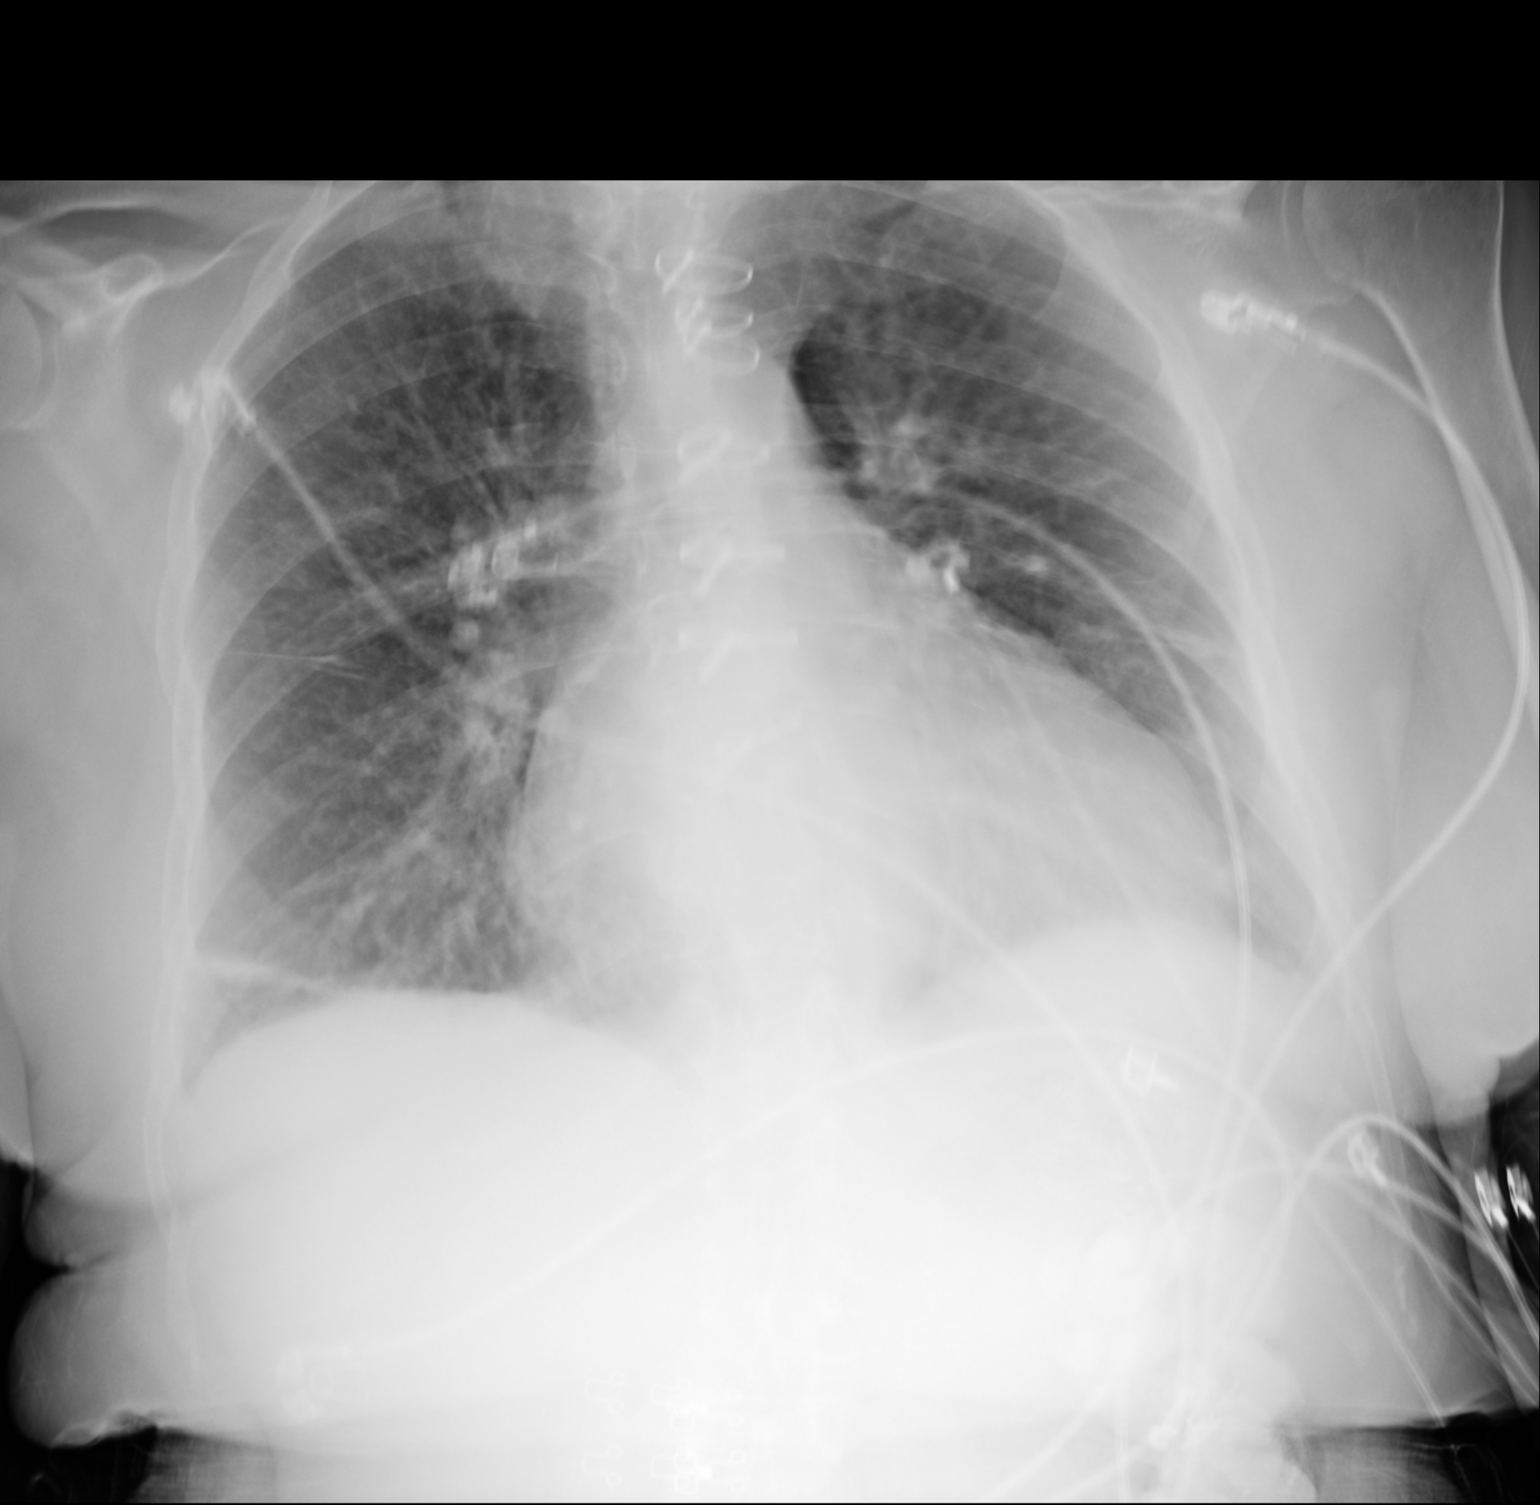

[1 of 1 positions shown; findings below may reference images not displayed]

FINDINGS: Minimal linear scarring or atelectasis in the right base. No
confluent airspace consolidation. No large effusion. No
pneumothorax. Mild respiratory motion degradation of the image.
Unchanged mild cardiomegaly.
IMPRESSION: Unchanged cardiomegaly.  No consolidation or large effusion.

## 2016-05-08 MED ORDER — ASPIRIN 325 MG PO TABS
162.0000 mg | ORAL_TABLET | Freq: Once | ORAL | Status: AC
  Administered 2016-05-08: 162 mg via ORAL
  Filled 2016-05-08: qty 1

## 2016-05-08 MED ORDER — LABETALOL HCL 5 MG/ML IV SOLN
20.0000 mg | Freq: Once | INTRAVENOUS | Status: AC
  Administered 2016-05-08: 20 mg via INTRAVENOUS
  Filled 2016-05-08: qty 4

## 2016-05-08 MED ORDER — NITROGLYCERIN 2 % TD OINT
1.0000 [in_us] | TOPICAL_OINTMENT | Freq: Once | TRANSDERMAL | Status: AC
  Administered 2016-05-08: 1 [in_us] via TOPICAL
  Filled 2016-05-08: qty 1

## 2016-05-08 MED ORDER — NITROGLYCERIN 2 % TD OINT
TOPICAL_OINTMENT | TRANSDERMAL | Status: AC
  Filled 2016-05-08: qty 1

## 2016-05-08 MED ORDER — NITROGLYCERIN IN D5W 200-5 MCG/ML-% IV SOLN
5.0000 ug/min | Freq: Once | INTRAVENOUS | Status: AC
  Administered 2016-05-08: 5 ug/min via INTRAVENOUS
  Filled 2016-05-08: qty 250

## 2016-05-08 NOTE — ED Triage Notes (Addendum)
Pt reports she was on the toilet having a BM and she began having pressure and pain across her whole chest that radiated into her back. Pt also reports some nausea and sob. Pt took 2 81 mg aspirin and 2 nitro with minimal relief. Pt stated her blood pressure was up and was heaving headaches.

## 2016-05-08 NOTE — ED Provider Notes (Signed)
[facility] DEPT Provider Note   CSN: [id number] Arrival date & time: 05/08/16  2250 By signing my name below, I, Dyke Brackett, attest that this documentation has been prepared under the direction and in the presence of Rolland Porter, MD . Electronically Signed: Dyke Brackett, Scribe. 05/08/2016 11:16 PM.   Time Seen  23:02 PM  History   Chief Complaint Chief Complaint  Patient presents with  . Chest Pain    HPI Shawna [name] is a 75 y.o. femalewith hx of CAD, CHF, SVT, PE, and ESRD who presents to the Emergency Department complaining of sudden onset, moderate chest pain which began tonight at 9:30 while having a bowel movement. She states she had to strain some during the bowel movement. Her pain does not radiate. Pt took 2 aspirin and 2 nitroglycerin with mild relief of pain PTA. Nothing makes the pain worse. She states she is still having pain, it is not gone.  Pt had 2 stents placed in 2015 and was not informed of any other blockages. She has never had this pain before.  Pt is on dialysis for ESRD and receives treatment Monday (tomorrow morning), Wednesday and Friday. Pt denies any tobacco or alcohol use. She notes associated SOB and nausea, but denies any diaphoresis, vomiting, or leg edema.   The history is provided by the patient. No language interpreter was used.    PCP Dr Nadara Mustard in [facility] Cardiology Dr Jacinta Shoe Nephrology Dr Lowanda Foster  Past Medical History:  Diagnosis Date  . Anxiety   . Arthritis   . AVM (arteriovenous malformation) of colon   . CAD (coronary artery disease)    a. 12/2011 NSTEMI/Cath/PCI LCX (2.25x14 Resolute DES) & D1 (2.25x22 Resolute DES);  b. 01/2012 Cath/PCI: LM 30, LAD 30p, 40-65m, D1 stent ok, 99 in sm branch of diag, LCX patent stent, OM1 20, RCA 95 ost (4.0x12 Promus DES), EF 55%;  c. 04/2012 Lexi Cardiolite  EF 48%, small area of scar @ base/mid inflat wall with mild peri-infarct ischemia.; CABG 12/4  . Carotid artery disease ([place])    a. 09-62%  LICA, 02/3661   . Chronic bronchitis ([place])   . Chronic diastolic CHF (congestive heart failure) ([place])    a. 02/2012 Echo EF 60-65%, nl wall motion, Gr 1 DD, mod MR  . Colon cancer ([place]) 1992  . Esophageal stricture   . ESRD on hemodialysis ([place])    ESRD due to HTN, started dialysis 2011 and gets HD at [facility] with Dr Hinda Lenis on MWF schedule.  Access is LUA AVF as of Sept 2014.   Marland Kitchen GERD (gastroesophageal reflux disease)   . High cholesterol 12/2011  . History of blood transfusion 07/2011; 12/2011; 01/2012 X 2; 04/2012  . History of gout   . History of lower GI bleeding   . Hypertension   . Iron deficiency anemia   . Mitral regurgitation    a. Moderate by echo, 02/2012  . Myocardial infarction   . Ovarian cancer ([place]) 1992  . Pneumonia ~ 2009  . PUD (peptic ulcer disease)   . TIA (transient ischemic attack)     Patient Active Problem List   Diagnosis Date Noted  . Hypertensive crisis without congestive heart failure 05/09/2016  . Acute pulmonary edema ([place]) 04/06/2016  . Acute respiratory failure ([place]) 04/06/2016  . Hypertensive crisis 01/27/2016  . History of colon cancer 01/27/2016  . History of ovarian cancer 01/27/2016  . Hypertensive urgency 01/27/2016  . Narrow complex tachycardia ([place]) 09/08/2015  . SVT (  supraventricular tachycardia) ([place]) 09/08/2015  . Influenza A 08/30/2015  . Acute on chronic diastolic CHF (congestive heart failure) ([place]) 05/04/2015  . Unstable angina ([place]) 05/03/2015  . Essential hypertension   . Pain in joint, lower leg 08/14/2014  . Chest pain 11/26/2013  . Small bowel obstruction, partial 05/29/2013  . Chronic diastolic CHF (congestive heart failure) ([place]) 03/22/2013  . GI bleeding 03/21/2013  . Acute blood loss anemia 03/21/2013  . Vaginal odor 03/12/2013  . Vaginal discharge 03/12/2013  . Occlusion and stenosis of carotid artery without mention of cerebral infarction 01/24/2013  . Hx of CABG 07/05/2012  . Carotid artery disease ([place])  07/05/2012  . Anemia of chronic kidney failure 07/03/2012  . Secondary hyperparathyroidism ([place]) 07/03/2012  . Mitral regurgitation 06/12/2012  . Pneumonia 06/09/2012  . Non-STEMI (non-ST elevated myocardial infarction) ([place]) 06/08/2012  . GERD (gastroesophageal reflux disease) 01/09/2012  . HLD (hyperlipidemia) 01/05/2012  . Coronary atherosclerosis of native coronary artery 12/16/2011  . Essential hypertension, benign 12/16/2011  . ESRD on hemodialysis ([place]) 12/16/2011    Past Surgical History:  Procedure Laterality Date  . ABDOMINAL HYSTERECTOMY  1992  . APPENDECTOMY  06/1990  . AV FISTULA PLACEMENT  07/2009   left upper arm  . COLON RESECTION  1992  . CORONARY ANGIOPLASTY WITH STENT PLACEMENT  12/15/11   "2"  . CORONARY ANGIOPLASTY WITH STENT PLACEMENT  y/2013   "1; makes total of 3" (05/02/2012)  . CORONARY ARTERY BYPASS GRAFT  06/13/2012   Procedure: CORONARY ARTERY BYPASS GRAFTING (CABG);  Surgeon: Grace Isaac, MD;  Location: [facility];  Service: Open Heart Surgery;  Laterality: N/A;  cabg x four;  using left internal mammary artery, and left leg greater saphenous vein harvested endoscopically  . DILATION AND CURETTAGE OF UTERUS    . ESOPHAGOGASTRODUODENOSCOPY  01/20/2012   Procedure: ESOPHAGOGASTRODUODENOSCOPY (EGD);  Surgeon: Ladene Artist, MD,FACG;  Location: [facility] ENDOSCOPY;  Service: Endoscopy;  Laterality: N/A;  . ESOPHAGOGASTRODUODENOSCOPY N/A 03/26/2013   Procedure: ESOPHAGOGASTRODUODENOSCOPY (EGD);  Surgeon: Irene Shipper, MD;  Location: [facility] ENDOSCOPY;  Service: Endoscopy;  Laterality: N/A;  . ESOPHAGOGASTRODUODENOSCOPY N/A 04/30/2015   Procedure: ESOPHAGOGASTRODUODENOSCOPY (EGD);  Surgeon: Rogene Houston, MD;  [facility];  Service: Endoscopy;  Laterality: N/A;  1pm - moved to 10/20 @ 1:10  . INTRAOPERATIVE TRANSESOPHAGEAL ECHOCARDIOGRAM  06/13/2012   Procedure: INTRAOPERATIVE TRANSESOPHAGEAL ECHOCARDIOGRAM;  Surgeon: Grace Isaac, MD;  Location: [place];   Service: Open Heart Surgery;  Laterality: N/A;  . LEFT HEART CATHETERIZATION WITH CORONARY ANGIOGRAM N/A 12/15/2011   Procedure: LEFT HEART CATHETERIZATION WITH CORONARY ANGIOGRAM;  Surgeon: Burnell Blanks, MD;  Location: [facility] CATH LAB;  Service: Cardiovascular;  Laterality: N/A;  . LEFT HEART CATHETERIZATION WITH CORONARY ANGIOGRAM N/A 01/10/2012   Procedure: LEFT HEART CATHETERIZATION WITH CORONARY ANGIOGRAM;  Surgeon: Peter M Martinique, MD;  Location[facility] C[facility] LAB;  Service: Cardiovascular;  Laterality: N/A;  . LEFT HEART CATHETERIZATION WITH CORONARY ANGIOGRAM N/A 06/08/2012   Procedure: LEFT HEART CATHETERIZATION WITH CORONARY ANGIOGRAM;  Surgeon: Burnell Blanks, MD;  Location[facility] C[facility] LAB;  Service: Cardiovascular;  Laterality: N/A;  . LEFT HEART CATHETERIZATION WITH CORONARY/GRAFT ANGIOGRAM N/A 12/10/2013   Procedure: LEFT HEART CATHETERIZATION WITH Beatrix Fetters;  Surgeon: [name], MD;  Location: [facility] CATH LAB;  Service: Cardiovascular;  Laterality: N/A;  . OVARY SURGERY     ovarian cancer  . SHUNTOGRAM N/A 10/15/2013   Procedure: Fistulogram;  Surgeon: Serafina Mitchell, MD;  Location: [facility] CATH LAB;  Service: Cardiovascular;  Laterality: N/A;  . THROMBECTOMY / ARTERIOVENOUS GRAFT REVISION  2011   left upper arm  . TUBAL LIGATION  1980's    OB History    Gravida Para Term Preterm AB Living   2 2   2   2    SAB TAB Ectopic Multiple Live Births                   Home Medications    Prior to Admission medications   Medication Sig Start Date End Date Taking? Authorizing Provider  ALPRAZolam Duanne Moron) 0.25 MG tablet Take 1-2 tablets by mouth at bedtime as needed for sleep.  11/19/15  Yes Historical Provider, MD  amiodarone (PACERONE) 200 MG tablet Take 1 tablet (200 mg total) by mouth daily. 09/23/15  Yes Herminio Commons, MD  aspirin EC 81 MG tablet Take 81 mg by mouth daily.    Yes Historical Provider, MD  cloNIDine (CATAPRES) 0.1 MG tablet Take 1 tablet (0.1  mg total) by mouth 2 (two) times daily. 04/08/16  Yes Orvan Falconer, MD  epoetin alfa (EPOGEN,PROCRIT) 28366 UNIT/ML injection Inject 1 mL (10,000 Units total) into the vein every Monday, Wednesday, and Friday with hemodialysis. 04/11/16  Yes Orvan Falconer, MD  fluticasone (FLONASE) 50 MCG/ACT nasal spray Place 2 sprays into the nose daily as needed for allergies.    Yes Historical Provider, MD  labetalol (NORMODYNE) 200 MG tablet Take 1 tablet (200 mg total) by mouth 2 (two) times daily. 04/08/16  Yes Orvan Falconer, MD  lanthanum (FOSRENOL) 1000 MG chewable tablet Chew 1 tablet (1,000 mg total) by mouth 3 (three) times daily with meals. 04/08/16  Yes Orvan Falconer, MD  multivitamin (RENA-VIT) TABS tablet Take 1 tablet by mouth daily.   Yes Historical Provider, MD  nitroGLYCERIN (NITROSTAT) 0.4 MG SL tablet Place 1 tablet (0.4 mg total) under the tongue every 5 (five) minutes as needed for chest pain. 04/13/16 07/12/16 Yes Herminio Commons, MD  omeprazole (PRILOSEC) 20 MG capsule TAKE ONE CAPSULE BY MOUTH ONCE DAILY 04/15/16  Yes Butch Penny, NP  PROAIR HFA 108 (90 BASE) MCG/ACT inhaler Inhale 1 puff into the lungs every 6 (six) hours as needed for wheezing or shortness of breath.  01/09/15  Yes Historical Provider, MD  SENSIPAR 30 MG tablet Take 30 mg by mouth daily with supper.  12/21/12  Yes Historical Provider, MD  simvastatin (ZOCOR) 20 MG tablet Take 1 tablet (20 mg total) by mouth at bedtime. 04/04/16  Yes Lendon Colonel, NP    Family History Family History  Problem Relation Age of Onset  . Heart disease Mother     Heart Disease before age 44  . Hyperlipidemia Mother   . Hypertension Mother   . Diabetes Mother   . Heart attack Mother   . Heart disease Father     Heart Disease before age 44  . Hyperlipidemia Father   . Hypertension Father   . Diabetes Father   . Diabetes Sister   . Hypertension Sister   . Diabetes Brother   . Hyperlipidemia Brother   . Heart attack Brother   . Hypertension Sister     . Heart attack Brother   . Other      noncontributory for early CAD  . Colon cancer Neg Hx   . Esophageal cancer Neg Hx   . Liver disease Neg Hx   . Kidney disease Neg Hx   . Colon polyps Neg Hx  Social History Social History  Substance Use Topics  . Smoking status: Never Smoker  . Smokeless tobacco: Never Used  . Alcohol use No     Allergies   Aspirin; Contrast media [iodinated diagnostic agents]; Iron; Penicillins; Plavix [clopidogrel bisulfate]; Amlodipine; Bactrim [sulfamethoxazole-trimethoprim]; Dexilant [dexlansoprazole]; Morphine and related; Nitrofurantoin; Prilosec [omeprazole]; Venofer [ferric oxide]; Levaquin [levofloxacin in d5w]; and Protonix [pantoprazole sodium]   Review of Systems Review of Systems  Constitutional: Negative for diaphoresis.  Cardiovascular: Positive for chest pain. Negative for leg swelling.  Gastrointestinal: Positive for nausea. Negative for vomiting.  All other systems reviewed and are negative.  Physical Exam Updated Vital Signs BP (!) 233/130 (BP Location: Right Arm)   Pulse 98   Resp 17   Ht 5\' 1"  (1.549 m)   Wt 133 lb (60.3 kg)   SpO2 95%   BMI 25.13 kg/m   Vital signs normal except for hypertension   Physical Exam  Constitutional: She is oriented to person, place, and time. She appears well-developed and well-nourished.  Non-toxic appearance. She does not appear ill. No distress.  HENT:  Head: Normocephalic and atraumatic.  Right Ear: External ear normal.  Left Ear: External ear normal.  Nose: Nose normal. No mucosal edema or rhinorrhea.  Mouth/Throat: Oropharynx is clear and moist and mucous membranes are normal. No dental abscesses or uvula swelling.  Eyes: Conjunctivae and EOM are normal. Pupils are equal, round, and reactive to light.  Neck: Normal range of motion and full passive range of motion without pain. Neck supple.  Cardiovascular: Normal rate, regular rhythm and normal heart sounds.  Exam reveals no  gallop and no friction rub.   No murmur heard. Pulmonary/Chest: Effort normal and breath sounds normal. No respiratory distress. She has no wheezes. She has no rhonchi. She has no rales. She exhibits no tenderness and no crepitus.  Abdominal: Soft. Normal appearance and bowel sounds are normal. She exhibits no distension. There is no tenderness. There is no rebound and no guarding.  Musculoskeletal: Normal range of motion. She exhibits edema (trace edema). She exhibits no tenderness.  Neurological: She is alert and oriented to person, place, and time. She has normal strength. No cranial nerve deficit.  Skin: Skin is warm, dry and intact. No rash noted. No erythema. No pallor.  Psychiatric: She has a normal mood and affect. Her speech is normal and behavior is normal. Her mood appears not anxious.  Nursing note and vitals reviewed.  ED Treatments / Results  DIAGNOSTIC STUDIES:  Oxygen Saturation is 95% on RA, low by my interpretation.     Labs (all labs ordered are listed, but only abnormal results are displayed) Results for orders placed or performed during the hospital encounter of 05/08/16  CBC with Differential  Result Value Ref Range   WBC 7.3 4.0 - 10.5 K/uL   RBC 3.34 (L) 3.87 - 5.11 MIL/uL   Hemoglobin 10.5 (L) 12.0 - 15.0 g/dL   HCT 34.0 (L) 36.0 - 46.0 %   MCV 101.8 (H) 78.0 - 100.0 fL   MCH 31.4 26.0 - 34.0 pg   MCHC 30.9 30.0 - 36.0 g/dL   RDW 17.6 (H) 11.5 - 15.5 %   Platelets 307 150 - 400 K/uL   Neutrophils Relative % 70 %   Neutro Abs 5.1 1.7 - 7.7 K/uL   Lymphocytes Relative 14 %   Lymphs Abs 1.1 0.7 - 4.0 K/uL   Monocytes Relative 8 %   Monocytes Absolute 0.6 0.1 - 1.0 K/uL  Eosinophils Relative 7 %   Eosinophils Absolute 0.5 0.0 - 0.7 K/uL   Basophils Relative 1 %   Basophils Absolute 0.0 0.0 - 0.1 K/uL  Comprehensive metabolic panel  Result Value Ref Range   Sodium 142 135 - 145 mmol/L   Potassium 4.9 3.5 - 5.1 mmol/L   Chloride 100 (L) 101 - 111 mmol/L    CO2 28 22 - 32 mmol/L   Glucose, Bld 120 (H) 65 - 99 mg/dL   BUN 65 (H) 6 - 20 mg/dL   Creatinine, Ser 11.09 (H) 0.44 - 1.00 mg/dL   Calcium 8.8 (L) 8.9 - 10.3 mg/dL   Total Protein 6.9 6.5 - 8.1 g/dL   Albumin 3.7 3.5 - 5.0 g/dL   AST 18 15 - 41 U/L   ALT 13 (L) 14 - 54 U/L   Alkaline Phosphatase 79 38 - 126 U/L   Total Bilirubin 0.7 0.3 - 1.2 mg/dL   GFR calc non Af Amer 3 (L) >60 mL/min   GFR calc Af Amer 3 (L) >60 mL/min   Anion gap 14 5 - 15  Troponin I  Result Value Ref Range   Troponin I 0.04 (HH) <0.03 ng/mL  Protime-INR  Result Value Ref Range   Prothrombin Time 12.3 11.4 - 15.2 seconds   INR 0.92   APTT  Result Value Ref Range   aPTT 27 24 - 36 seconds   Laboratory interpretation all normal except mild anemia, mildlyl + troponin (chronic), renal failure    EKG  EKG Interpretation  Date/Time:  Sunday May 08 2016 22:59:27 EDT Ventricular Rate:  104 PR Interval:    QRS Duration: 96 QT Interval:  351 QTC Calculation: 462 R Axis:   62 Text Interpretation:  Sinus tachycardia Probable LVH with secondary repol abnrm Since last tracing 06 Apr 2016 Abnormal T, probable ischemia, lateral leads Anterior ST elevation, probably due to LVH Confirmed by Braylen Staller  MD-I, Adrian Dinovo ([id number]) on 05/08/2016 11:02:36 PM       Radiology Dg Chest Port 1 View  Result Date: 05/09/2016 CLINICAL DATA:  Chest pain radiating into the back, onset tonight during bowel movement. Minimal relief with nitroglycerin. EXAM: PORTABLE CHEST 1 VIEW COMPARISON:  04/06/2016 FINDINGS: Minimal linear scarring or atelectasis in the right base. No confluent airspace consolidation. No large effusion. No pneumothorax. Mild respiratory motion degradation of the image. Unchanged mild cardiomegaly. IMPRESSION: Unchanged cardiomegaly.  No consolidation or large effusion. Electronically Signed   By: Andreas Newport M.D.   On: 05/09/2016 00:07    Procedures Procedures (including critical care  time)  Medications Ordered in ED Medications  labetalol (NORMODYNE,TRANDATE) 500 mg in dextrose 5 % 125 mL (4 mg/mL) infusion (1.5 mg/min Intravenous Rate/Dose Change 05/09/16 0057)  albuterol (PROVENTIL) (2.5 MG/3ML) 0.083% nebulizer solution (  Not Given 05/09/16 0041)  diphenhydrAMINE (BENADRYL) injection 12.5 mg (not administered)  diphenhydrAMINE (BENADRYL) 12.5 MG/5ML elixir (not administered)  nitroGLYCERIN (NITROGLYN) 2 % ointment 1 inch (1 inch Topical Given 05/08/16 2321)  nitroGLYCERIN 50 mg in dextrose 5 % 250 mL (0.2 mg/mL) infusion (55 mcg/min Intravenous Rate/Dose Change 05/09/16 0107)  labetalol (NORMODYNE,TRANDATE) injection 20 mg (20 mg Intravenous Given 05/08/16 2335)  aspirin tablet 162 mg (162 mg Oral Given 05/08/16 2342)  labetalol (NORMODYNE,TRANDATE) injection 20 mg (20 mg Intravenous Given 05/09/16 0017)  ondansetron (ZOFRAN) injection 4 mg (4 mg Intravenous Given 05/09/16 0034)  albuterol (PROVENTIL) (2.5 MG/3ML) 0.083% nebulizer solution 5 mg (5 mg Nebulization Given 05/09/16 0040)  Initial Impression / Assessment and Plan / ED Course  I have reviewed the triage vital signs and the nursing notes.  Pertinent labs & imaging results that were available during my care of the patient were reviewed by me and considered in my medical decision making (see chart for details).  COORDINATION OF CARE:  11:07 PM Will order DG chest, CBC, CMC, Protime-INR, troponin, and APTT. Discussed treatment plan with pt at bedside and pt agreed to plan.    Pt was started on NTG drip and IV bolus of labetolol (hypertensive and mild ST). Pt has new ST depression and   Recheck at 23:00 PM pain is still a "2". She took 162 mg of ASA at home, will given an additional 162 mg.   23:40 Dr Rolly Salter, has reviewed her EKG, feels she needs BP control and dialysis tomorrow, she has consistently positive troponins, can stay here and if needs to come to [facility] would have hospitalists admit.   12:15  AM BP still elevated after NTG drip and IV bolus of labetalol, labetalol drip started.  Clinical Course  Comment By Time  Pt still hypertensive. Nitroglycerin drip was at 10 micrograms/min. Discussed with nursing staff to titrate more rapidly. Pt is starting to c/o feeling SOB. Albuterol nebulizer was ordered. On exam, lungs are clear. Will talk to hospitalist about admission Dyke Brackett 10/30 0020   12:55 AM Feeling better as far as SOB after nebulizer.  01:00 Nurses report pt c/o itching of her feet, given IV benadryl  PT is on NTG and labetalol drips to control her BP. She will need admission for hypertensive crisis.    01:10 Dr Myna Hidalgo, admit to step down  Pt was seen by me on September 27th for hypertension and developed flash pulmonary edema requiring intubation and emergent dialysis   Review of old notes Cardiac Cath June 2015 IMPRESSIONS:  1. Patent left main coronary artery. 2. Focal significant disease in the mid to distal left anterior descending artery.  Patent diagonal branch.  LIMA to LAD is widely patent. 3. Severe restenosis of the proximal left circumflex artery stent. Patent SVG to circumflex.  70% proximal ramus stenosis. Widely patent SVG to ramus. 4. Occluded stent at the ostium of the right coronary artery. Widely patent SVG to distal RCA. 5. Left ventricular systolic function not assessed.  LVEDP 20 mmHg.    RECOMMENDATION:  Continue medical therapy including aggressive blood pressure control.  I stressed the importance of compliance with her medications. Of note, despite being premedicated for contrast allergy, after the procedure, she had significant itching in her feet. We have given her IV Benadryl to help with this.  F/u with Dr. Bronson Ing..    Final Clinical Impressions(s) / ED Diagnoses   Final diagnoses:  Hypertensive crisis  Chest wall pain  ESRD on hemodialysis [facility])   Plan admission  Rolland Porter, MD, [place] Performed by:  Quindarius Cabello L [place] Total critical care time: 50 minutes Critical care time was exclusive of separately billable procedures and treating other patients. Critical care was necessary to treat or prevent imminent or life-threatening deterioration. Critical care was time spent personally by me on the following activities: development of treatment plan with patient and/or surrogate as well as nursing, discussions with consultants, evaluation of patient's response to treatment, examination of patient, obtaining history from patient or surrogate, ordering and performing treatments and interventions, ordering and review of laboratory studies, ordering and review of radiographic studies, pulse oximetry and re-evaluation of  patient's condition.    I personally performed the services described in this documentation, which was scribed in my presence. The recorded information has been reviewed and considered.  Rolland Porter, MD, Abram Sander    Rolland Porter, MD 05/09/16 [phone]

## 2016-05-09 ENCOUNTER — Encounter (HOSPITAL_COMMUNITY): Payer: Self-pay | Admitting: Family Medicine

## 2016-05-09 DIAGNOSIS — F05 Delirium due to known physiological condition: Secondary | ICD-10-CM | POA: Diagnosis not present

## 2016-05-09 DIAGNOSIS — R4182 Altered mental status, unspecified: Secondary | ICD-10-CM | POA: Diagnosis not present

## 2016-05-09 DIAGNOSIS — J9690 Respiratory failure, unspecified, unspecified whether with hypoxia or hypercapnia: Secondary | ICD-10-CM | POA: Diagnosis not present

## 2016-05-09 DIAGNOSIS — D631 Anemia in chronic kidney disease: Secondary | ICD-10-CM | POA: Diagnosis not present

## 2016-05-09 DIAGNOSIS — R34 Anuria and oliguria: Secondary | ICD-10-CM | POA: Diagnosis not present

## 2016-05-09 DIAGNOSIS — N186 End stage renal disease: Secondary | ICD-10-CM | POA: Diagnosis not present

## 2016-05-09 DIAGNOSIS — Z955 Presence of coronary angioplasty implant and graft: Secondary | ICD-10-CM | POA: Diagnosis not present

## 2016-05-09 DIAGNOSIS — M6281 Muscle weakness (generalized): Secondary | ICD-10-CM | POA: Diagnosis not present

## 2016-05-09 DIAGNOSIS — I82711 Chronic embolism and thrombosis of superficial veins of right upper extremity: Secondary | ICD-10-CM | POA: Diagnosis present

## 2016-05-09 DIAGNOSIS — I248 Other forms of acute ischemic heart disease: Secondary | ICD-10-CM | POA: Diagnosis not present

## 2016-05-09 DIAGNOSIS — R402114 Coma scale, eyes open, never, 24 hours or more after hospital admission: Secondary | ICD-10-CM | POA: Diagnosis not present

## 2016-05-09 DIAGNOSIS — Z4682 Encounter for fitting and adjustment of non-vascular catheter: Secondary | ICD-10-CM | POA: Diagnosis not present

## 2016-05-09 DIAGNOSIS — J9811 Atelectasis: Secondary | ICD-10-CM | POA: Diagnosis not present

## 2016-05-09 DIAGNOSIS — I132 Hypertensive heart and chronic kidney disease with heart failure and with stage 5 chronic kidney disease, or end stage renal disease: Secondary | ICD-10-CM | POA: Diagnosis present

## 2016-05-09 DIAGNOSIS — R0789 Other chest pain: Secondary | ICD-10-CM | POA: Diagnosis not present

## 2016-05-09 DIAGNOSIS — E875 Hyperkalemia: Secondary | ICD-10-CM | POA: Diagnosis not present

## 2016-05-09 DIAGNOSIS — I82611 Acute embolism and thrombosis of superficial veins of right upper extremity: Secondary | ICD-10-CM | POA: Diagnosis not present

## 2016-05-09 DIAGNOSIS — R402314 Coma scale, best motor response, none, 24 hours or more after hospital admission: Secondary | ICD-10-CM | POA: Diagnosis not present

## 2016-05-09 DIAGNOSIS — Z992 Dependence on renal dialysis: Secondary | ICD-10-CM | POA: Diagnosis not present

## 2016-05-09 DIAGNOSIS — Z515 Encounter for palliative care: Secondary | ICD-10-CM | POA: Diagnosis not present

## 2016-05-09 DIAGNOSIS — I169 Hypertensive crisis, unspecified: Secondary | ICD-10-CM | POA: Diagnosis present

## 2016-05-09 DIAGNOSIS — Z951 Presence of aortocoronary bypass graft: Secondary | ICD-10-CM | POA: Diagnosis not present

## 2016-05-09 DIAGNOSIS — J9601 Acute respiratory failure with hypoxia: Secondary | ICD-10-CM | POA: Diagnosis not present

## 2016-05-09 DIAGNOSIS — N185 Chronic kidney disease, stage 5: Secondary | ICD-10-CM

## 2016-05-09 DIAGNOSIS — R0902 Hypoxemia: Secondary | ICD-10-CM | POA: Diagnosis not present

## 2016-05-09 DIAGNOSIS — R41841 Cognitive communication deficit: Secondary | ICD-10-CM | POA: Diagnosis not present

## 2016-05-09 DIAGNOSIS — G729 Myopathy, unspecified: Secondary | ICD-10-CM | POA: Diagnosis not present

## 2016-05-09 DIAGNOSIS — R402214 Coma scale, best verbal response, none, 24 hours or more after hospital admission: Secondary | ICD-10-CM | POA: Diagnosis not present

## 2016-05-09 DIAGNOSIS — J42 Unspecified chronic bronchitis: Secondary | ICD-10-CM | POA: Diagnosis present

## 2016-05-09 DIAGNOSIS — I5032 Chronic diastolic (congestive) heart failure: Secondary | ICD-10-CM

## 2016-05-09 DIAGNOSIS — Z7189 Other specified counseling: Secondary | ICD-10-CM | POA: Diagnosis not present

## 2016-05-09 DIAGNOSIS — I161 Hypertensive emergency: Secondary | ICD-10-CM | POA: Diagnosis not present

## 2016-05-09 DIAGNOSIS — G9340 Encephalopathy, unspecified: Secondary | ICD-10-CM | POA: Diagnosis not present

## 2016-05-09 DIAGNOSIS — I468 Cardiac arrest due to other underlying condition: Secondary | ICD-10-CM | POA: Diagnosis not present

## 2016-05-09 DIAGNOSIS — M19041 Primary osteoarthritis, right hand: Secondary | ICD-10-CM | POA: Diagnosis not present

## 2016-05-09 DIAGNOSIS — Z7401 Bed confinement status: Secondary | ICD-10-CM | POA: Diagnosis not present

## 2016-05-09 DIAGNOSIS — I10 Essential (primary) hypertension: Secondary | ICD-10-CM | POA: Diagnosis not present

## 2016-05-09 DIAGNOSIS — R0602 Shortness of breath: Secondary | ICD-10-CM | POA: Diagnosis not present

## 2016-05-09 DIAGNOSIS — I959 Hypotension, unspecified: Secondary | ICD-10-CM | POA: Diagnosis not present

## 2016-05-09 DIAGNOSIS — I2510 Atherosclerotic heart disease of native coronary artery without angina pectoris: Secondary | ICD-10-CM

## 2016-05-09 DIAGNOSIS — I120 Hypertensive chronic kidney disease with stage 5 chronic kidney disease or end stage renal disease: Secondary | ICD-10-CM | POA: Diagnosis not present

## 2016-05-09 DIAGNOSIS — G9349 Other encephalopathy: Secondary | ICD-10-CM | POA: Diagnosis not present

## 2016-05-09 DIAGNOSIS — I472 Ventricular tachycardia: Secondary | ICD-10-CM | POA: Diagnosis not present

## 2016-05-09 DIAGNOSIS — J9600 Acute respiratory failure, unspecified whether with hypoxia or hypercapnia: Secondary | ICD-10-CM | POA: Diagnosis not present

## 2016-05-09 DIAGNOSIS — R1311 Dysphagia, oral phase: Secondary | ICD-10-CM | POA: Diagnosis not present

## 2016-05-09 DIAGNOSIS — R279 Unspecified lack of coordination: Secondary | ICD-10-CM | POA: Diagnosis not present

## 2016-05-09 DIAGNOSIS — Z452 Encounter for adjustment and management of vascular access device: Secondary | ICD-10-CM | POA: Diagnosis not present

## 2016-05-09 DIAGNOSIS — I469 Cardiac arrest, cause unspecified: Secondary | ICD-10-CM | POA: Diagnosis not present

## 2016-05-09 DIAGNOSIS — K219 Gastro-esophageal reflux disease without esophagitis: Secondary | ICD-10-CM | POA: Diagnosis present

## 2016-05-09 LAB — COMPREHENSIVE METABOLIC PANEL
ALBUMIN: 3.7 g/dL (ref 3.5–5.0)
ALK PHOS: 79 U/L (ref 38–126)
ALT: 13 U/L — ABNORMAL LOW (ref 14–54)
ANION GAP: 14 (ref 5–15)
AST: 18 U/L (ref 15–41)
BILIRUBIN TOTAL: 0.7 mg/dL (ref 0.3–1.2)
BUN: 65 mg/dL — AB (ref 6–20)
CALCIUM: 8.8 mg/dL — AB (ref 8.9–10.3)
CO2: 28 mmol/L (ref 22–32)
Chloride: 100 mmol/L — ABNORMAL LOW (ref 101–111)
Creatinine, Ser: 11.09 mg/dL — ABNORMAL HIGH (ref 0.44–1.00)
GFR calc Af Amer: 3 mL/min — ABNORMAL LOW (ref >60)
GFR calc non Af Amer: 3 mL/min — ABNORMAL LOW (ref >60)
GLUCOSE: 120 mg/dL — AB (ref 65–99)
Potassium: 4.9 mmol/L (ref 3.5–5.1)
Sodium: 142 mmol/L (ref 135–145)
TOTAL PROTEIN: 6.9 g/dL (ref 6.5–8.1)

## 2016-05-09 LAB — CBC
HEMATOCRIT: 29.4 % — AB (ref 36.0–46.0)
Hemoglobin: 9 g/dL — ABNORMAL LOW (ref 12.0–15.0)
MCH: 31.1 pg (ref 26.0–34.0)
MCHC: 30.6 g/dL (ref 30.0–36.0)
MCV: 101.7 fL — AB (ref 78.0–100.0)
Platelets: 254 10*3/uL (ref 150–400)
RBC: 2.89 MIL/uL — ABNORMAL LOW (ref 3.87–5.11)
RDW: 17.9 % — AB (ref 11.5–15.5)
WBC: 6.2 10*3/uL (ref 4.0–10.5)

## 2016-05-09 LAB — TROPONIN I
Troponin I: 0.04 ng/mL (ref <0.03)
Troponin I: 0.05 ng/mL (ref <0.03)
Troponin I: 0.05 ng/mL (ref <0.03)

## 2016-05-09 LAB — RENAL FUNCTION PANEL
ALBUMIN: 3.2 g/dL — AB (ref 3.5–5.0)
Anion gap: 9 (ref 5–15)
BUN: 43 mg/dL — AB (ref 6–20)
CO2: 27 mmol/L (ref 22–32)
Calcium: 7.9 mg/dL — ABNORMAL LOW (ref 8.9–10.3)
Chloride: 99 mmol/L — ABNORMAL LOW (ref 101–111)
Creatinine, Ser: 7.39 mg/dL — ABNORMAL HIGH (ref 0.44–1.00)
GFR calc Af Amer: 6 mL/min — ABNORMAL LOW (ref >60)
GFR calc non Af Amer: 5 mL/min — ABNORMAL LOW (ref >60)
GLUCOSE: 112 mg/dL — AB (ref 65–99)
PHOSPHORUS: 1.3 mg/dL — AB (ref 2.5–4.6)
POTASSIUM: 3.9 mmol/L (ref 3.5–5.1)
Sodium: 135 mmol/L (ref 135–145)

## 2016-05-09 LAB — BASIC METABOLIC PANEL
Anion gap: 9 (ref 5–15)
BUN: 42 mg/dL — ABNORMAL HIGH (ref 6–20)
CO2: 27 mmol/L (ref 22–32)
Calcium: 7.9 mg/dL — ABNORMAL LOW (ref 8.9–10.3)
Chloride: 99 mmol/L — ABNORMAL LOW (ref 101–111)
Creatinine, Ser: 7.56 mg/dL — ABNORMAL HIGH (ref 0.44–1.00)
GFR calc Af Amer: 5 mL/min — ABNORMAL LOW (ref >60)
GFR calc non Af Amer: 5 mL/min — ABNORMAL LOW (ref >60)
Glucose, Bld: 111 mg/dL — ABNORMAL HIGH (ref 65–99)
Potassium: 3.9 mmol/L (ref 3.5–5.1)
Sodium: 135 mmol/L (ref 135–145)

## 2016-05-09 LAB — MRSA PCR SCREENING: MRSA by PCR: NEGATIVE

## 2016-05-09 LAB — APTT: APTT: 27 s (ref 24–36)

## 2016-05-09 LAB — T4, FREE: Free T4: 0.72 ng/dL (ref 0.61–1.12)

## 2016-05-09 LAB — TSH: TSH: 10.5 u[IU]/mL — ABNORMAL HIGH (ref 0.350–4.500)

## 2016-05-09 LAB — PROTIME-INR
INR: 0.92
Prothrombin Time: 12.3 seconds (ref 11.4–15.2)

## 2016-05-09 LAB — GLUCOSE, CAPILLARY: Glucose-Capillary: 160 mg/dL — ABNORMAL HIGH (ref 65–99)

## 2016-05-09 MED ORDER — LIDOCAINE-PRILOCAINE 2.5-2.5 % EX CREA: 1.0000 "application " | TOPICAL_CREAM | CUTANEOUS | Status: DC | PRN

## 2016-05-09 MED ORDER — ONDANSETRON HCL 4 MG/2ML IJ SOLN: 4.0000 mg | Freq: Four times a day (QID) | INTRAMUSCULAR | Status: DC | PRN

## 2016-05-09 MED ORDER — LABETALOL HCL 200 MG PO TABS
200.0000 mg | ORAL_TABLET | Freq: Two times a day (BID) | ORAL | Status: DC
  Administered 2016-05-09 – 2016-05-10 (×3): 200 mg via ORAL
  Filled 2016-05-09 (×3): qty 1

## 2016-05-09 MED ORDER — ASPIRIN EC 81 MG PO TBEC
81.0000 mg | DELAYED_RELEASE_TABLET | Freq: Every day | ORAL | Status: DC
  Administered 2016-05-09 – 2016-05-25 (×4): 81 mg via ORAL
  Filled 2016-05-09 (×4): qty 1

## 2016-05-09 MED ORDER — POTASSIUM PHOSPHATES 15 MMOLE/5ML IV SOLN
30.0000 mmol | Freq: Once | INTRAVENOUS | Status: AC
  Administered 2016-05-09: 30 mmol via INTRAVENOUS
  Filled 2016-05-09: qty 10

## 2016-05-09 MED ORDER — FLUTICASONE PROPIONATE 50 MCG/ACT NA SUSP: 2.0000 | Freq: Every day | NASAL | Status: DC | PRN

## 2016-05-09 MED ORDER — ONDANSETRON HCL 4 MG/2ML IJ SOLN
INTRAMUSCULAR | Status: AC
Start: 2016-05-09 — End: 2016-05-09
  Filled 2016-05-09: qty 2

## 2016-05-09 MED ORDER — ALBUTEROL SULFATE (2.5 MG/3ML) 0.083% IN NEBU
INHALATION_SOLUTION | RESPIRATORY_TRACT | Status: AC
  Filled 2016-05-09: qty 6

## 2016-05-09 MED ORDER — ACETAMINOPHEN 325 MG PO TABS: 650.0000 mg | ORAL_TABLET | ORAL | Status: DC | PRN

## 2016-05-09 MED ORDER — PANTOPRAZOLE SODIUM 40 MG PO TBEC
40.0000 mg | DELAYED_RELEASE_TABLET | Freq: Every day | ORAL | Status: DC
  Administered 2016-05-09 – 2016-05-10 (×2): 40 mg via ORAL
  Filled 2016-05-09 (×2): qty 1

## 2016-05-09 MED ORDER — SODIUM CHLORIDE 0.9 % IV SOLN: 100.0000 mL | INTRAVENOUS | Status: DC | PRN

## 2016-05-09 MED ORDER — LANTHANUM CARBONATE 500 MG PO CHEW
1000.0000 mg | CHEWABLE_TABLET | Freq: Three times a day (TID) | ORAL | Status: DC
  Administered 2016-05-09: 1000 mg via ORAL
  Filled 2016-05-09 (×8): qty 2

## 2016-05-09 MED ORDER — HEPARIN SODIUM (PORCINE) 5000 UNIT/ML IJ SOLN
5000.0000 [IU] | Freq: Three times a day (TID) | INTRAMUSCULAR | Status: DC
  Administered 2016-05-09 – 2016-05-17 (×24): 5000 [IU] via SUBCUTANEOUS
  Filled 2016-05-09 (×22): qty 1

## 2016-05-09 MED ORDER — DIPHENHYDRAMINE HCL 12.5 MG/5ML PO ELIX
ORAL_SOLUTION | ORAL | Status: AC
  Filled 2016-05-09: qty 5

## 2016-05-09 MED ORDER — ALBUTEROL SULFATE (2.5 MG/3ML) 0.083% IN NEBU
5.0000 mg | INHALATION_SOLUTION | Freq: Once | RESPIRATORY_TRACT | Status: AC
  Administered 2016-05-09: 5 mg via RESPIRATORY_TRACT

## 2016-05-09 MED ORDER — LABETALOL HCL 5 MG/ML IV SOLN
20.0000 mg | Freq: Once | INTRAVENOUS | Status: AC
  Administered 2016-05-09: 20 mg via INTRAVENOUS
  Filled 2016-05-09: qty 4

## 2016-05-09 MED ORDER — ALPRAZOLAM 0.5 MG PO TABS
0.5000 mg | ORAL_TABLET | Freq: Once | ORAL | Status: AC
  Administered 2016-05-09: 0.5 mg via ORAL

## 2016-05-09 MED ORDER — LABETALOL HCL 5 MG/ML IV SOLN
INTRAVENOUS | Status: AC
  Filled 2016-05-09: qty 4

## 2016-05-09 MED ORDER — PENTAFLUOROPROP-TETRAFLUOROETH EX AERO: 1.0000 "application " | INHALATION_SPRAY | CUTANEOUS | Status: DC | PRN

## 2016-05-09 MED ORDER — DIPHENHYDRAMINE HCL 50 MG/ML IJ SOLN
12.5000 mg | Freq: Once | INTRAMUSCULAR | Status: AC
Start: 2016-05-09 — End: 2016-05-09
  Administered 2016-05-09: 12.5 mg via INTRAVENOUS
  Filled 2016-05-09: qty 1

## 2016-05-09 MED ORDER — ONDANSETRON HCL 4 MG/2ML IJ SOLN
4.0000 mg | Freq: Once | INTRAMUSCULAR | Status: AC
  Administered 2016-05-09: 4 mg via INTRAVENOUS

## 2016-05-09 MED ORDER — NITROGLYCERIN IN D5W 200-5 MCG/ML-% IV SOLN
0.0000 ug/min | INTRAVENOUS | Status: DC
  Administered 2016-05-09: 30 ug/min via INTRAVENOUS

## 2016-05-09 MED ORDER — IPRATROPIUM-ALBUTEROL 0.5-2.5 (3) MG/3ML IN SOLN
3.0000 mL | RESPIRATORY_TRACT | Status: DC | PRN
  Administered 2016-05-11: 3 mL via RESPIRATORY_TRACT
  Filled 2016-05-09: qty 3

## 2016-05-09 MED ORDER — LABETALOL HCL 5 MG/ML IV SOLN
0.5000 mg/min | INTRAVENOUS | Status: DC
  Administered 2016-05-09: 0.5 mg/min via INTRAVENOUS
  Administered 2016-05-09: 2 mg/min via INTRAVENOUS
  Filled 2016-05-09: qty 100

## 2016-05-09 MED ORDER — LIDOCAINE HCL (PF) 1 % IJ SOLN
5.0000 mL | INTRAMUSCULAR | Status: DC | PRN
  Filled 2016-05-09: qty 5

## 2016-05-09 MED ORDER — LABETALOL HCL 5 MG/ML IV SOLN
INTRAVENOUS | Status: AC
  Filled 2016-05-09: qty 20

## 2016-05-09 MED ORDER — CINACALCET HCL 30 MG PO TABS
30.0000 mg | ORAL_TABLET | Freq: Every day | ORAL | Status: DC
  Filled 2016-05-09 (×2): qty 1

## 2016-05-09 MED ORDER — LIDOCAINE HCL (PF) 1 % IJ SOLN
INTRAMUSCULAR | Status: AC
  Filled 2016-05-09: qty 5

## 2016-05-09 MED ORDER — CLONIDINE HCL 0.1 MG PO TABS
0.1000 mg | ORAL_TABLET | Freq: Two times a day (BID) | ORAL | Status: DC
  Administered 2016-05-10 (×2): 0.1 mg via ORAL
  Filled 2016-05-09 (×2): qty 1

## 2016-05-09 MED ORDER — SIMVASTATIN 10 MG PO TABS
20.0000 mg | ORAL_TABLET | Freq: Every day | ORAL | Status: DC
  Administered 2016-05-10: 20 mg via ORAL
  Filled 2016-05-09 (×3): qty 2

## 2016-05-09 MED ORDER — RENA-VITE PO TABS
1.0000 | ORAL_TABLET | Freq: Every day | ORAL | Status: DC
  Administered 2016-05-09 – 2016-05-25 (×8): 1 via ORAL
  Filled 2016-05-09 (×8): qty 1

## 2016-05-09 MED ORDER — ALPRAZOLAM 0.5 MG PO TABS
0.2500 mg | ORAL_TABLET | Freq: Every evening | ORAL | Status: DC | PRN
  Administered 2016-05-09 – 2016-05-23 (×3): 0.5 mg via ORAL
  Filled 2016-05-09 (×4): qty 1

## 2016-05-09 MED ORDER — AMIODARONE HCL 200 MG PO TABS
200.0000 mg | ORAL_TABLET | Freq: Every day | ORAL | Status: DC
  Administered 2016-05-10 – 2016-05-25 (×8): 200 mg via ORAL
  Filled 2016-05-09 (×9): qty 1

## 2016-05-09 NOTE — ED Notes (Signed)
AC called for Labetalol drip

## 2016-05-09 NOTE — Consult Note (Signed)
Reason for Consult: End-stage renal disease for dialysis Referring Physician: Dr.Goodrich  Shawna [name] is an 75 y.o. female.  HPI: She is a patient who has history of labile hypertension, coronary artery disease, end-stage renal disease on maintenance hemodialysis presently came with complaints of generalized chest pain, mainly on the left with radiation to her back. She also complains of headache mainly on the left side starting last night. According to the patient her blood pressure was also very high hence decided to come to the emergency room. Emergency room patient was found to have BP of 230/130. Patient was given IV labetalol and started on nitro drip. Presently patient is feeling much better. She doesn't have any chest pain this morning. She has some headache but improving. She denies any difficulty in breathing. Patient has been admitted multiple times because of similar issues.  Past Medical History:  Diagnosis Date  . Anxiety   . Arthritis   . AVM (arteriovenous malformation) of colon   . CAD (coronary artery disease)    a. 12/2011 NSTEMI/Cath/PCI LCX (2.25x14 Resolute DES) & D1 (2.25x22 Resolute DES);  b. 01/2012 Cath/PCI: LM 30, LAD 30p, 40-61m D1 stent ok, 99 in sm branch of diag, LCX patent stent, OM1 20, RCA 95 ost (4.0x12 Promus DES), EF 55%;  c. 04/2012 Lexi Cardiolite  EF 48%, small area of scar @ base/mid inflat wall with mild peri-infarct ischemia.; CABG 12/4  . Carotid artery disease ([place]    a. 670-96%LICA, 92/8366  . Chronic bronchitis ([place]   . Chronic diastolic CHF (congestive heart failure) ([place]    a. 02/2012 Echo EF 60-65%, nl wall motion, Gr 1 DD, mod MR  . Colon cancer ([place] 1992  . Esophageal stricture   . ESRD on hemodialysis ([place]    ESRD due to HTN, started dialysis 2011 [facility]  Access is LUA AVF as of Sept 2014.   .Marland KitchenGERD (gastroesophageal reflux disease)   . High cholesterol 12/2011  . History of blood  transfusion 07/2011; 12/2011; 01/2012 X 2; 04/2012  . History of gout   . History of lower GI bleeding   . Hypertension   . Iron deficiency anemia   . Mitral regurgitation    a. Moderate by echo, 02/2012  . Myocardial infarction   . Ovarian cancer ([place] 1992  . Pneumonia ~ 2009  . PUD (peptic ulcer disease)   . TIA (transient ischemic attack)     Past Surgical History:  Procedure Laterality Date  . ABDOMINAL HYSTERECTOMY  1992  . APPENDECTOMY  06/1990  . AV FISTULA PLACEMENT  07/2009   left upper arm  . COLON RESECTION  1992  . CORONARY ANGIOPLASTY WITH STENT PLACEMENT  12/15/11   "2"  . CORONARY ANGIOPLASTY WITH STENT PLACEMENT  y/2013   "1; makes total of 3" (05/02/2012)  . CORONARY ARTERY BYPASS GRAFT  06/13/2012   Procedure: CORONARY ARTERY BYPASS GRAFTING (CABG);  Surgeon: EGrace Isaac MD;  [facility]  Service: Open Heart Surgery;  Laterality: N/A;  cabg x four;  using left internal mammary artery, and left leg greater saphenous vein harvested endoscopically  . DILATION AND CURETTAGE OF UTERUS    . ESOPHAGOGASTRODUODENOSCOPY  01/20/2012   Procedure: ESOPHAGOGASTRODUODENOSCOPY (EGD);  Surgeon: MLadene Artist MD,FACG;  [facility];  Service: Endoscopy;  Laterality: N/A;  . ESOPHAGOGASTRODUODENOSCOPY N/A 03/26/2013   Procedure: ESOPHAGOGASTRODUODENOSCOPY (EGD);  Surgeon: JIrene Shipper MD;  [facility];  Service: Endoscopy;  Laterality: N/A;  . ESOPHAGOGASTRODUODENOSCOPY N/A 04/30/2015   Procedure: ESOPHAGOGASTRODUODENOSCOPY (EGD);  Surgeon: Rogene Houston, MD;  [facility];  Service: Endoscopy;  Laterality: N/A;  1pm - moved to 10/20 @ 1:10  . INTRAOPERATIVE TRANSESOPHAGEAL ECHOCARDIOGRAM  06/13/2012   Procedure: INTRAOPERATIVE TRANSESOPHAGEAL ECHOCARDIOGRAM;  Surgeon: Grace Isaac, MD;  Location: [place];  Service: Open Heart Surgery;  Laterality: N/A;  . LEFT HEART CATHETERIZATION WITH CORONARY ANGIOGRAM N/A 12/15/2011   Procedure: LEFT  HEART CATHETERIZATION WITH CORONARY ANGIOGRAM;  Surgeon: Burnell Blanks, MD;  Location[facility] C[facility] LAB;  Service: Cardiovascular;  Laterality: N/A;  . LEFT HEART CATHETERIZATION WITH CORONARY ANGIOGRAM N/A 01/10/2012   Procedure: LEFT HEART CATHETERIZATION WITH CORONARY ANGIOGRAM;  Surgeon: Peter M Martinique, MD;  Location: [facility] CATH LAB;  Service: Cardiovascular;  Laterality: N/A;  . LEFT HEART CATHETERIZATION WITH CORONARY ANGIOGRAM N/A 06/08/2012   Procedure: LEFT HEART CATHETERIZATION WITH CORONARY ANGIOGRAM;  Surgeon: Burnell Blanks, MD;  Location: [facility] CATH LAB;  Service: Cardiovascular;  Laterality: N/A;  . LEFT HEART CATHETERIZATION WITH CORONARY/GRAFT ANGIOGRAM N/A 12/10/2013   Procedure: LEFT HEART CATHETERIZATION WITH Beatrix Fetters;  Surgeon: [name], MD;  Location: [facility] CATH LAB;  Service: Cardiovascular;  Laterality: N/A;  . OVARY SURGERY     ovarian cancer  . SHUNTOGRAM N/A 10/15/2013   Procedure: Fistulogram;  Surgeon: Serafina Mitchell, MD;  Location[facility] C[facility] LAB;  Service: Cardiovascular;  Laterality: N/A;  . THROMBECTOMY / ARTERIOVENOUS GRAFT REVISION  2011   left upper arm  . TUBAL LIGATION  1980's    Family History  Problem Relation Age of Onset  . Heart disease Mother     Heart Disease before age 35  . Hyperlipidemia Mother   . Hypertension Mother   . Diabetes Mother   . Heart attack Mother   . Heart disease Father     Heart Disease before age 61  . Hyperlipidemia Father   . Hypertension Father   . Diabetes Father   . Diabetes Sister   . Hypertension Sister   . Diabetes Brother   . Hyperlipidemia Brother   . Heart attack Brother   . Hypertension Sister   . Heart attack Brother   . Other      noncontributory for early CAD  . Colon cancer Neg Hx   . Esophageal cancer Neg Hx   . Liver disease Neg Hx   . Kidney disease Neg Hx   . Colon polyps Neg Hx     Social History:  reports that she has never smoked. She has never used smokeless  tobacco. She reports that she does not drink alcohol or use drugs.  Allergies:    Medications: I have reviewed the patient's current medications.  Results for orders placed or performed during the hospital encounter of 05/08/16 (from the past 48 hour(s))  CBC with Differential     Status: Abnormal   Collection Time: 05/08/16 11:26 PM  Result Value Ref Range   WBC 7.3 4.0 - 10.5 K/uL   RBC 3.34 (L) 3.87 - 5.11 MIL/uL   Hemoglobin 10.5 (L) 12.0 - 15.0 g/dL   HCT 34.0 (L) 36.0 - 46.0 %   MCV 101.8 (H) 78.0 - 100.0 fL   MCH 31.4 26.0 - 34.0 pg   MCHC 30.9 30.0 - 36.0 g/dL   RDW 17.6 (H) 11.5 - 15.5 %   Platelets 307 150 - 400 K/uL   Neutrophils Relative %  70 %   Neutro Abs 5.1 1.7 - 7.7 K/uL   Lymphocytes Relative 14 %   Lymphs Abs 1.1 0.7 - 4.0 K/uL   Monocytes Relative 8 %   Monocytes Absolute 0.6 0.1 - 1.0 K/uL   Eosinophils Relative 7 %   Eosinophils Absolute 0.5 0.0 - 0.7 K/uL   Basophils Relative 1 %   Basophils Absolute 0.0 0.0 - 0.1 K/uL  Comprehensive metabolic panel     Status: Abnormal   Collection Time: 05/08/16 11:26 PM  Result Value Ref Range   Sodium 142 135 - 145 mmol/L   Potassium 4.9 3.5 - 5.1 mmol/L   Chloride 100 (L) 101 - 111 mmol/L   CO2 28 22 - 32 mmol/L   Glucose, Bld 120 (H) 65 - 99 mg/dL   BUN 65 (H) 6 - 20 mg/dL   Creatinine, Ser 11.09 (H) 0.44 - 1.00 mg/dL   Calcium 8.8 (L) 8.9 - 10.3 mg/dL   Total Protein 6.9 6.5 - 8.1 g/dL   Albumin 3.7 3.5 - 5.0 g/dL   AST 18 15 - 41 U/L   ALT 13 (L) 14 - 54 U/L   Alkaline Phosphatase 79 38 - 126 U/L   Total Bilirubin 0.7 0.3 - 1.2 mg/dL   GFR calc non Af Amer 3 (L) >60 mL/min   GFR calc Af Amer 3 (L) >60 mL/min    Comment: (NOTE) The eGFR has been calculated using the CKD EPI equation. This calculation has not been validated in all clinical situations. eGFR's persistently <60 mL/min signify possible Chronic Kidney Disease.    Anion gap 14 5 - 15  Troponin I     Status: Abnormal   Collection Time:  05/08/16 11:26 PM  Result Value Ref Range   Troponin I 0.04 (HH) <0.03 ng/mL    Comment: CRITICAL RESULT CALLED TO, READ BACK BY AND VERIFIED WITH: SANDERS A AT 0006 ON [id number] BY FORSYTH K   Protime-INR     Status: None   Collection Time: 05/08/16 11:51 PM  Result Value Ref Range   Prothrombin Time 12.3 11.4 - 15.2 seconds   INR 0.92   APTT     Status: None   Collection Time: 05/08/16 11:51 PM  Result Value Ref Range   aPTT 27 24 - 36 seconds  TSH     Status: Abnormal   Collection Time: 05/09/16  2:25 AM  Result Value Ref Range   TSH 10.500 (H) 0.350 - 4.500 uIU/mL    Comment: Performed by a 3rd Generation assay with a functional sensitivity of <=0.01 uIU/mL.  Troponin I     Status: Abnormal   Collection Time: 05/09/16  2:25 AM  Result Value Ref Range   Troponin I 0.05 (HH) <0.03 ng/mL    Comment: CRITICAL VALUE NOTED.  VALUE IS CONSISTENT WITH PREVIOUSLY REPORTED AND CALLED VALUE.  MRSA PCR Screening     Status: None   Collection Time: 05/09/16  2:38 AM  Result Value Ref Range   MRSA by PCR NEGATIVE NEGATIVE    Comment:        The GeneXpert MRSA Assay (FDA approved for NASAL specimens only), is one component of a comprehensive MRSA colonization surveillance program. It is not intended to diagnose MRSA infection nor to guide or monitor treatment for MRSA infections.     Dg Chest Port 1 View  Result Date: 05/09/2016 CLINICAL DATA:  Chest pain radiating into the back, onset tonight during bowel movement. Minimal relief with nitroglycerin. EXAM:  PORTABLE CHEST 1 VIEW COMPARISON:  04/06/2016 FINDINGS: Minimal linear scarring or atelectasis in the right base. No confluent airspace consolidation. No large effusion. No pneumothorax. Mild respiratory motion degradation of the image. Unchanged mild cardiomegaly. IMPRESSION: Unchanged cardiomegaly.  No consolidation or large effusion. Electronically Signed   By: Andreas Newport M.D.   On: 05/09/2016 00:07    Review of Systems   Constitutional: Negative for fever.  Eyes: Positive for blurred vision.  Respiratory: Negative for shortness of breath.   Cardiovascular: Positive for chest pain and palpitations.  Gastrointestinal: Negative for abdominal pain, nausea and vomiting.  Neurological: Positive for headaches. Negative for weakness.   Blood pressure (!) 163/74, pulse 63, temperature 97.9 F (36.6 C), temperature source Oral, resp. rate 20, height 5' 1"  (1.549 m), weight 61.3 kg (135 lb 2.3 oz), SpO2 100 %. Physical Exam  Constitutional: No distress.  Eyes: No scleral icterus.  Neck: No JVD present.  Cardiovascular: Normal rate and regular rhythm.   Murmur heard. Respiratory: No respiratory distress.  GI: She exhibits no distension. There is no tenderness.  Musculoskeletal: She exhibits no edema.    Assessment/Plan: Problem #1 chest pain: Recurrent problem. Patient has been evaluated by cardiology. Problem #2 labile hypertension: Patient is on labetalol 200 mg by mouth twice a day and clonidine 0.1 mg by mouth twice a day at home. Nifedipine XL 60 mg was added but according to the patient she doesn't take it as it was stopped because of swelling of the legs. Patient at this moment doesn't stopped it. Her blood pressure however was reasonably controlled when she comes to dialysis units. At times patient take her blood pressure at home and change the way she is taking it. Problem #3 severe anxiety disorder Problem #4 end-stage renal disease: She is status post hemodialysis on Friday. Presently she doesn't have any nausea or vomiting. Today is her regular dialysis Problem #5 anemia: Her hemoglobin is within target goal Problem #6 metabolic bone disease: Her calcium and phosphorus is range as outpatient. Problem #7  fluid management: Presently she doesn't have any significant sign of fluid overload. Plan: We'll make arrangements for patient to get dialysis today for 4 hours We'll remove about 2 L his systolic  blood pressure remains stable We'll continue with present management. Possibly we may add nifedipine if blood pressure remains high depending on cardiology's suggestion.  Synia Douglass S 05/09/2016, 8:49 AM

## 2016-05-09 NOTE — ED Notes (Signed)
EDP Tomi Bamberger made aware of pt's new symptoms.

## 2016-05-09 NOTE — ED Notes (Signed)
As this nurse was giving pt Labetalol, pt stated, "you can't give me too much of that because if my BP gets under 578 (systolic), I go out." Pt also questioning the 162 mg of aspirin that was ordered stating "oh, that's too much aspirin." I can't take that much because it messes my stomach up." Pt agreed to additional aspirin and labetalol.

## 2016-05-09 NOTE — ED Notes (Signed)
CRITICAL VALUE ALERT  Critical value received:  Troponin 0.04  Date of notification:  05/09/2016  Time of notification: 1206  Critical value read back: yes  Nurse who received alert: Callie Fielding, RN  MD notified (1st page):  Dr. Tomi Bamberger  Time of first page:  1207  MD notified (2nd page):  Time of second page:  Responding MD:  Dr. Tomi Bamberger  Time MD responded:  1207

## 2016-05-09 NOTE — ED Notes (Signed)
Pt refusing any more sticks or iv's. Pt states "I have been stuck enough."

## 2016-05-09 NOTE — ED Notes (Signed)
Pt reporting mild sob. Respiratory in with pt to give neb treatment.

## 2016-05-09 NOTE — ED Notes (Signed)
Pt c/o headache, chest pressure 5/10, and her face felt hot/flush.

## 2016-05-09 NOTE — ED Notes (Signed)
Pt reporting that her feet are itching. EDP made aware.

## 2016-05-09 NOTE — ED Notes (Signed)
Pt continue to question about the medicine being givenand asking if the meds are making her feel hot. Pt's husband continues to read her BP's out to her.

## 2016-05-09 NOTE — ED Notes (Signed)
Pt now allowing Gwyn[place], lab to draw her blood.

## 2016-05-09 NOTE — ED Notes (Signed)
Pt placed on 2L o2 by respiratory

## 2016-05-09 NOTE — Progress Notes (Signed)
PROGRESS NOTE  Shawna [name] OFB:[id number] DOB: Dec 01, 1940 DOA: 05/08/2016 PCP: [name], MD  Brief Narrative: 75 year old woman PMH coronary artery disease, end-stage renal disease, presented with acute onset of chest pain unrelieved by aspirin and nitroglycerin. In the emergency department BP 2:30/1:30, noted to have new ST depression. Treated with nitroglycerin infusion and IV labetalol. Admitted for hypertensive crisis with chest pain.  Assessment/Plan: 1. Hypertensive crisis with chest pain. Systolic blood pressure 782U. Chest pain resolved. Troponins flat, and no evidence of ACS. Currently on labetalol and nitroglycerin infusions. 2. Coronary artery disease status post CABG, stenting. No evidence of ACS. 3. Chronic diastolic congestive heart failure appears stable. 4. PSVT, maintained on amiodarone, currently in sinus rhythm. 5. End-stage renal disease, management per nephrology 6. Elevated TSH. Recheck as an outpatient. 7. Anemia of chronic kidney disease. Stable.   Blood pressure improving although still hypertensive on nitroglycerin and labetalol infusions. Continue to follow troponins. Follow-up cardiology recommendations. Wean off infusions as tolerated.  Dialysis per nephrology.  DVT prophylaxis: heparin  Code Status: full code Family Communication: Husband at bedside Disposition Plan: Home  Murray Hodgkins, MD  [facility] Direct contact: [phone] --[place] [place]  --[email or web address]; password [email or web address]  7PM-7AM contact night coverage as above 05/09/2016, 8:28 AM  LOS: 0 days   Consultants:  Cardiology   Procedures:    Antimicrobials:    CC: f/u HTN crisis  Interval history/Subjective: Feels better. No chest pain. No shortness of breath. No nausea or vomiting. No diaphoresis. No pain in left arm neck or jaw.   Objective: Vitals:   05/09/16 0630 05/09/16 0645 05/09/16 0700 05/09/16 0745  BP: (!) 149/74 (!) 154/77 (!) 154/70   Pulse: 64 63  63   Resp: (!) 21 19 18    Temp:    97.9 F (36.6 C)  TempSrc:    Oral  SpO2: 100% 100% 100%   Weight:      Height:        Intake/Output Summary (Last 24 hours) at 05/09/16 0828 Last data filed at 05/09/16 0703  Gross per 24 hour  Intake            207.3 ml  Output                0 ml  Net            207.3 ml     Filed Weights   05/08/16 2258 05/09/16 0300  Weight: 60.3 kg (133 lb) 61.3 kg (135 lb 2.3 oz)    Exam:    Constitutional:  . Appears calm and comfortable Eyes:  . PERRL and irises appear normal . Conjunctivae and lids appear normal ENMT:  . grossly normal hearing  Respiratory:  . CTA bilaterally, no w/r/r.  . Respiratory effort normal. No retractions or accessory muscle use Cardiovascular:  . RRR, no m/r/g . No LE extremity edema   . Telemetry SR Skin:  . No rashes, lesions, ulcers noted . palpation of skin: no induration or nodules Psychiatric:  . judgement and insight appear normal . Mental status o Mood, affect appropriate  I have personally reviewed following labs and imaging studies:  Troponins flat as far.  TSH 10.5   Potassium was normal on admission.   Hemoglobin was stable on admission.  EKG on admission sinus tach, inferolateral ST depression, new compared to previous study 04/06/2016.  Scheduled Meds: . albuterol      . amiodarone  200 mg Oral Daily  . aspirin EC  81 mg Oral Daily  . cinacalcet  30 mg Oral Q supper  . [START ON 05/10/2016] cloNIDine  0.1 mg Oral BID  . heparin  5,000 Units Subcutaneous Q8H  . lanthanum  1,000 mg Oral TID WC  . multivitamin  1 tablet Oral Daily  . pantoprazole  40 mg Oral Daily  . [START ON 05/10/2016] simvastatin  20 mg Oral QHS   Continuous Infusions: . labetalol (NORMODYNE) infusion 1 mg/min (05/09/16 0345)  . nitroGLYCERIN 6 mcg/min (05/09/16 0703)    Principal Problem:   Hypertensive crisis Active Problems:   Coronary atherosclerosis of native coronary artery   ESRD on  hemodialysis (HCC)   GERD (gastroesophageal reflux disease)   Anemia of chronic kidney failure   Chronic diastolic CHF (congestive heart failure) (HCC)   Chest pain   Hypertensive crisis without congestive heart failure   LOS: 0 days

## 2016-05-09 NOTE — Consult Note (Signed)
Primary cardiologist: Dr Bronson Ing Consulting cardiologist: Dr Carlyle Dolly Requesting physician: Dr Murray Hodgkins Indication: HTN  Clinical Summary Shawna [name] is a 75 y.o.female with history of admission with HTN emergency and pulmonary edema requiring intubation in 03/2016, ESRD, CAD with prior stenting and CABG, carotid stenosis, chronic diastolic HF, HL, PSVT on amidoarone admitted with chest pain. In ER bp's 230/130. She was started on NG and labetalol drips, admitted to stepdown unit. From prior notes she has had very labile bp's, she has had multiple admissions in the past with hypertensive urgency and emergency. She reports medication and hemodialysis compliance.   Chest pain started around 930pm while in bathroom. 10/10 pain throughout entire chest, no other associated symptoms. Lasted about 15 minutes, better with ASA and NG.   WBC 7, Hg 10.5, Plt 307, K 4.9, Cr 11, BUN 65,  Trop 0.04--> 0.05 over 3 hours CXR no acute process EKG SR, inferior and lateral TWI and ST depressions 09/2015 echo: LVEF 60-65%, grade II diastolic dysfunction, moderate mR 08/2014 nuclear stress: no ischemia   Allergies: reviwed in epic   Medications Scheduled Medications: . albuterol      . amiodarone  200 mg Oral Daily  . aspirin EC  81 mg Oral Daily  . cinacalcet  30 mg Oral Q supper  . [START ON 05/10/2016] cloNIDine  0.1 mg Oral BID  . heparin  5,000 Units Subcutaneous Q8H  . lanthanum  1,000 mg Oral TID WC  . multivitamin  1 tablet Oral Daily  . pantoprazole  40 mg Oral Daily  . [START ON 05/10/2016] simvastatin  20 mg Oral QHS     Infusions: . labetalol (NORMODYNE) infusion 1 mg/min (05/09/16 0345)  . nitroGLYCERIN 6 mcg/min (05/09/16 0703)     PRN Medications:  acetaminophen, ALPRAZolam, fluticasone, ipratropium-albuterol, ondansetron (ZOFRAN) IV   Past Medical History:  Diagnosis Date  . Anxiety   . Arthritis   . AVM (arteriovenous malformation) of colon   . CAD  (coronary artery disease)    a. 12/2011 NSTEMI/Cath/PCI LCX (2.25x14 Resolute DES) & D1 (2.25x22 Resolute DES);  b. 01/2012 Cath/PCI: LM 30, LAD 30p, 40-64m, D1 stent ok, 99 in sm Loretta Kluender of diag, LCX patent stent, OM1 20, RCA 95 ost (4.0x12 Promus DES), EF 55%;  c. 04/2012 Lexi Cardiolite  EF 48%, small area of scar @ base/mid inflat wall with mild peri-infarct ischemia.; CABG 12/4  . Carotid artery disease ([place])    a. 91-47% LICA, 02/2955   . Chronic bronchitis ([place])   . Chronic diastolic CHF (congestive heart failure) ([place])    a. 02/2012 Echo EF 60-65%, nl wall motion, Gr 1 DD, mod MR  . Colon cancer ([place]) 1992  . Esophageal stricture   . ESRD on hemodialysis ([place])    ESRD due to HTN, started dialysis 2011 and gets HD at [facility] with Dr Hinda Lenis on MWF schedule.  Access is LUA AVF as of Sept 2014.   Marland Kitchen GERD (gastroesophageal reflux disease)   . High cholesterol 12/2011  . History of blood transfusion 07/2011; 12/2011; 01/2012 X 2; 04/2012  . History of gout   . History of lower GI bleeding   . Hypertension   . Iron deficiency anemia   . Mitral regurgitation    a. Moderate by echo, 02/2012  . Myocardial infarction   . Ovarian cancer ([place]) 1992  . Pneumonia ~ 2009  . PUD (peptic ulcer disease)   . TIA (transient ischemic attack)  Past Surgical History:  Procedure Laterality Date  . ABDOMINAL HYSTERECTOMY  1992  . APPENDECTOMY  06/1990  . AV FISTULA PLACEMENT  07/2009   left upper arm  . COLON RESECTION  1992  . CORONARY ANGIOPLASTY WITH STENT PLACEMENT  12/15/11   "2"  . CORONARY ANGIOPLASTY WITH STENT PLACEMENT  y/2013   "1; makes total of 3" (05/02/2012)  . CORONARY ARTERY BYPASS GRAFT  06/13/2012   Procedure: CORONARY ARTERY BYPASS GRAFTING (CABG);  Surgeon: Grace Isaac, MD;  Location: [facility];  Service: Open Heart Surgery;  Laterality: N/A;  cabg x four;  using left internal mammary artery, and left leg greater saphenous vein harvested endoscopically  . DILATION AND CURETTAGE  OF UTERUS    . ESOPHAGOGASTRODUODENOSCOPY  01/20/2012   Procedure: ESOPHAGOGASTRODUODENOSCOPY (EGD);  Surgeon: Ladene Artist, MD,FACG;  Location: [facility] ENDOSCOPY;  Service: Endoscopy;  Laterality: N/A;  . ESOPHAGOGASTRODUODENOSCOPY N/A 03/26/2013   Procedure: ESOPHAGOGASTRODUODENOSCOPY (EGD);  Surgeon: Irene Shipper, MD;  Location: [facility] ENDOSCOPY;  Service: Endoscopy;  Laterality: N/A;  . ESOPHAGOGASTRODUODENOSCOPY N/A 04/30/2015   Procedure: ESOPHAGOGASTRODUODENOSCOPY (EGD);  Surgeon: Rogene Houston, MD;  [facility]O [facility];  Service: Endoscopy;  Laterality: N/A;  1pm - moved to 10/20 @ 1:10  . INTRAOPERATIVE TRANSESOPHAGEAL ECHOCARDIOGRAM  06/13/2012   Procedure: INTRAOPERATIVE TRANSESOPHAGEAL ECHOCARDIOGRAM;  Surgeon: Grace Isaac, MD;  Location: [facility];  Service: Open Heart Surgery;  Laterality: N/A;  . LEFT HEART CATHETERIZATION WITH CORONARY ANGIOGRAM N/A 12/15/2011   Procedure: LEFT HEART CATHETERIZATION WITH CORONARY ANGIOGRAM;  Surgeon: Burnell Blanks, MD;  Location: [facility] CATH LAB;  Service: Cardiovascular;  Laterality: N/A;  . LEFT HEART CATHETERIZATION WITH CORONARY ANGIOGRAM N/A 01/10/2012   Procedure: LEFT HEART CATHETERIZATION WITH CORONARY ANGIOGRAM;  Surgeon: Peter M Martinique, MD;  Location: [facility] CATH LAB;  Service: Cardiovascular;  Laterality: N/A;  . LEFT HEART CATHETERIZATION WITH CORONARY ANGIOGRAM N/A 06/08/2012   Procedure: LEFT HEART CATHETERIZATION WITH CORONARY ANGIOGRAM;  Surgeon: Burnell Blanks, MD;  Location: [facility] CATH LAB;  Service: Cardiovascular;  Laterality: N/A;  . LEFT HEART CATHETERIZATION WITH CORONARY/GRAFT ANGIOGRAM N/A 12/10/2013   Procedure: LEFT HEART CATHETERIZATION WITH Beatrix Fetters;  Surgeon: [name], MD;  Location: [facility] CATH LAB;  Service: Cardiovascular;  Laterality: N/A;  . OVARY SURGERY     ovarian cancer  . SHUNTOGRAM N/A 10/15/2013   Procedure: Fistulogram;  Surgeon: Serafina Mitchell, MD;  Location: [facility] CATH LAB;  Service:  Cardiovascular;  Laterality: N/A;  . THROMBECTOMY / ARTERIOVENOUS GRAFT REVISION  2011   left upper arm  . TUBAL LIGATION  1980's    Family History  Problem Relation Age of Onset  . Heart disease Mother     Heart Disease before age 46  . Hyperlipidemia Mother   . Hypertension Mother   . Diabetes Mother   . Heart attack Mother   . Heart disease Father     Heart Disease before age 55  . Hyperlipidemia Father   . Hypertension Father   . Diabetes Father   . Diabetes Sister   . Hypertension Sister   . Diabetes Brother   . Hyperlipidemia Brother   . Heart attack Brother   . Hypertension Sister   . Heart attack Brother   . Other      noncontributory for early CAD  . Colon cancer Neg Hx   . Esophageal cancer Neg Hx   . Liver disease Neg Hx   . Kidney disease Neg Hx   .  Colon polyps Neg Hx     Social History Ms. Ihrig reports that she has never smoked. She has never used smokeless tobacco. Ms. Blumenstock reports that she does not drink alcohol.  Review of Systems CONSTITUTIONAL: No weight loss, fever, chills, weakness or fatigue.  HEENT: Eyes: No visual loss, blurred vision, double vision or yellow sclerae. No hearing loss, sneezing, congestion, runny nose or sore throat.  SKIN: No rash or itching.  CARDIOVASCULAR: per HPI RESPIRATORY: No shortness of breath, cough or sputum.  GASTROINTESTINAL: No anorexia, nausea, vomiting or diarrhea. No abdominal pain or blood.  GENITOURINARY: no polyuria, no dysuria NEUROLOGICAL: No headache, dizziness, syncope, paralysis, ataxia, numbness or tingling in the extremities. No change in bowel or bladder control.  MUSCULOSKELETAL: No muscle, back pain, joint pain or stiffness.  HEMATOLOGIC: No anemia, bleeding or bruising.  LYMPHATICS: No enlarged nodes. No history of splenectomy.  PSYCHIATRIC: No history of depression or anxiety.      Physical Examination Blood pressure (!) 154/70, pulse 63, temperature 97.9 F (36.6 C), temperature  source Oral, resp. rate 18, height 5\' 1"  (1.549 m), weight 135 lb 2.3 oz (61.3 kg), SpO2 100 %.  Intake/Output Summary (Last 24 hours) at 05/09/16 4098 Last data filed at 05/09/16 0703  Gross per 24 hour  Intake            207.3 ml  Output                0 ml  Net            207.3 ml    HEENT: sclera clear, throat clear  Cardiovascular: RRR, no m/rg, no jvd  Respiratory: CTAB  GI: abdomen, soft, NT, ND  MSK: no LE edema  Neuro: no focal deficits  Psych: appropriate affect   Lab Results  Basic Metabolic Panel:  Recent Labs Lab 05/08/16 2326  NA 142  K 4.9  CL 100*  CO2 28  GLUCOSE 120*  BUN 65*  CREATININE 11.09*  CALCIUM 8.8*    Liver Function Tests:  Recent Labs Lab 05/08/16 2326  AST 18  ALT 13*  ALKPHOS 79  BILITOT 0.7  PROT 6.9  ALBUMIN 3.7    CBC:  Recent Labs Lab 05/08/16 2326  WBC 7.3  NEUTROABS 5.1  HGB 10.5*  HCT 34.0*  MCV 101.8*  PLT 307    Cardiac Enzymes:  Recent Labs Lab 05/08/16 2326 05/09/16 0225  TROPONINI 0.04* 0.05*    BNP: Invalid input(s): POCBNP     Impression/Recommendations 1. Hypertensive emergency - admitted with severe hypertension SBP 230, multiple admits in the past with HTN urgency/emergency - home regimen clonidine 0.1mg  bid, labetlol 200mg  bid - currently on labetlol drip, clonidine 0.1mg  bid, NG drip  2. Elevated troponin -mild elevation fairly flat thus far in setting of severe HTN. She does have a history of CAD. At this time favor likely demand ischemia in setting of severe HTN as opposed to ACS.  - continue bp control and follow symptoms and troponin trend  3. HTN - long complex history, complicated by episodes of severe low bp's on HD and severe elevated bp's with admissions of HTN urgency and emergency - currently on clonidine and labetlolol. Norvasc had been stopped in the past due to LE edema. In discussions with renal she was to try nifedipine but does not appear she has been  taking.  - we will d/c NG drip, continue labetolol drip for now. Start oral labetlol this evening after hemodialysis.  -  she may require different regiments on her HD vs non HD days. Perhaps if she takes nifedipine just on non HD days her bp's would be better controlled and LE edema would not be an issue. Hydralazine recently stopped during admission due to low bp's. Could also consider an ACE-I/ARB, I am unclear if this has been tried in the past.  - restart home labetalol this evening after HD, will stop drip at that time. Continue clonidine. Follow bp's from there for further adjustments.      Carlyle Dolly, M.D.

## 2016-05-09 NOTE — ED Notes (Signed)
Pt reporting chest tightness.

## 2016-05-09 NOTE — Progress Notes (Signed)
Initial Nutrition Assessment   INTERVENTION:   Liberalize diet to Heart Healthy   Provide high protein snacks between meals   NUTRITION DIAGNOSIS:  Increased protein/energy needs related to ESRD with HD as evidenced by est needs  GOAL:  Pt to meet >/= 90% of their estimated nutrition needs      MONITOR:  Po intake, labs and wt trends     REASON FOR ASSESSMENT:   Malnutrition Screening Tool    ASSESSMENT:  [name] who presents with chest pain, nausea and dyspnea. She has hx of ESRD with HD (5 yrs per pt), CAD, GERD, CHF, HTN.   Patient says her appetite is typically fair. She follows regular / no added salt diet at home. She doesn't drink Nepro Supplements because (causes loose stool). Feeds herself and able to communicate food choices.   Her weight hx is stable over the past few months -her wt in June 60 kg.  Patient reports dry wt at HD Center 58 kg.    Recent Labs Lab 05/08/16 2326 05/09/16 1430  NA 142 135  135  K 4.9 3.9  3.9  CL 100* 99*  99*  CO2 28 27  27   BUN 65* 43*  42*  CREATININE 11.09* 7.39*  7.56*  CALCIUM 8.8* 7.9*  7.9*  PHOS  --  1.3*  GLUCOSE 120* 112*  111*   Labs: BUN 42, Cr 7.56, Phos 1.3   Meds/ vitamins: Fosrenol, Rena-Vite, PPI,   Unable to complete Nutrition-Focused physical exam at this time. Pt is in the process of dialysis treatment.   Diet Order:  Diet Heart Room service appropriate? Yes; Fluid consistency: Thin; Fluid restriction: 1200 mL Fluid  Skin:  Reviewed, no issues  Last BM:  10/29  Height:   Ht Readings from Last 1 Encounters:  05/09/16 5\' 1"  (1.549 m)    Weight:   Wt Readings from Last 1 Encounters:  05/09/16 135 lb 9.3 oz (61.5 kg)    Ideal Body Weight:  48 kg  BMI:  Body mass index is 25.62 kg/m.  Estimated Nutritional Needs:   Kcal:  1800-1950  Protein:  75-85 gr  Fluid:  1.2 liters daily   EDUCATION NEEDS:  None at this time   Colman Cater MS,RD,CSG,LDN Office:  #[phone] Pager: [phone]

## 2016-05-09 NOTE — ED Notes (Signed)
Pt reporting nausea. EDP aware and orders received.

## 2016-05-09 NOTE — ED Notes (Signed)
EDP made aware that thi pt is reporting increased chest tightness.

## 2016-05-09 NOTE — H&P (Signed)
History and Physical    Shawna [name] QZE:[id number] DOB: [birth year] DOA: 05/08/2016  PCP: Shawna Percy, MD   Patient coming from: Home  Chief Complaint: Chest pain, nausea, dyspnea   HPI: Shawna [name] is a 75 y.o. female with medical history significant for CAD with multiple stents and then CABG, end-stage renal disease on hemodialysis, GERD, anxiety, hypertension, chronic diastolic CHF, and mitral regurgitation who presents to the emergency department with acute chest pain. Patient reports that she had been in her usual state of health throughout the day and was having a bowel movement when she developed acute onset of pain in the left chest with mild nausea and dyspnea. As described as severe, tightness, localized to the left upper chest, and without any appreciable exacerbating factors. She took 162 mg of aspirin and sublingual nitroglycerin 2 with improvement in her pain from a 10/10, to a 2/10. Symptoms improved, but did not resolve, and the patient came into the ED for evaluation of this. She denies any recent fevers or chills, long distance travel, sick contacts, leg swelling, or orthopnea. There has been no recent trauma. She denies headache, change in vision or hearing, or focal numbness or weakness. There is no cough and her mild dyspnea has improved.   ED Course: Upon arrival to the ED, patient is found to be saturating well on room air, hypertensive to 230/130, and with vitals otherwise stable. EKG demonstrates a sinus tachycardia with T-wave inversions in the inferolateral leads that are more pronounced than on prior EKG. Chest x-ray is notable for cardiomegaly, but no acute cardiopulmonary disease. Chemistry panel features a creatinine of 11.09. CBC is notable for a stable macrocytic anemia with hemoglobin of 10.5 and MCV of 101.8. INR is within the normal limits and troponin is elevated to a value 0.04. Cardiology was consulted by the ED physician and advised that the patient could  state [facility] for management of her blood pressure and cycling of her cardiac enzymes. Patient was treated with labetalol 20 mg IV push 2 and given an additional 162 mg of aspirin. Blood pressure remained markedly elevated, she continued to have some mild chest discomfort, and she was started on labetalol and nitroglycerin infusions. She will be admitted to the stepdown unit for ongoing evaluation and management of hypertensive crisis with chest discomfort, EKG change, and borderline elevation in serum troponin.  Review of Systems:  All other systems reviewed and apart from HPI, are negative.  Past Medical History:  Diagnosis Date  . Anxiety   . Arthritis   . AVM (arteriovenous malformation) of colon   . CAD (coronary artery disease)    a. 12/2011 NSTEMI/Cath/PCI LCX (2.25x14 Resolute DES) & D1 (2.25x22 Resolute DES);  b. 01/2012 Cath/PCI: LM 30, LAD 30p, 40-77m, D1 stent ok, 99 in sm branch of diag, LCX patent stent, OM1 20, RCA 95 ost (4.0x12 Promus DES), EF 55%;  c. 04/2012 Lexi Cardiolite  EF 48%, small area of scar @ base/mid inflat wall with mild peri-infarct ischemia.; CABG 12/4  . Carotid artery disease ([place])    a. 22-63% LICA, 09/3543   . Chronic bronchitis ([place])   . Chronic diastolic CHF (congestive heart failure) ([place])    a. 02/2012 Echo EF 60-65%, nl wall motion, Gr 1 DD, mod MR  . Colon cancer ([place]) 1992  . Esophageal stricture   . ESRD on hemodialysis [facility])    ESRD due to HTN, started dialysis 2011 and gets HD at [facility] with Shawna [name]  on MWF schedule.  Access is LUA AVF as of Sept 2014.   Marland Kitchen GERD (gastroesophageal reflux disease)   . High cholesterol 12/2011  . History of blood transfusion 07/2011; 12/2011; 01/2012 X 2; 04/2012  . History of gout   . History of lower GI bleeding   . Hypertension   . Iron deficiency anemia   . Mitral regurgitation    a. Moderate by echo, 02/2012  . Myocardial infarction   . Ovarian cancer ([place]) 1992  . Pneumonia ~ 2009  . PUD (peptic  ulcer disease)   . TIA (transient ischemic attack)     Past Surgical History:  Procedure Laterality Date  . ABDOMINAL HYSTERECTOMY  1992  . APPENDECTOMY  06/1990  . AV FISTULA PLACEMENT  07/2009   left upper arm  . COLON RESECTION  1992  . CORONARY ANGIOPLASTY WITH STENT PLACEMENT  12/15/11   "2"  . CORONARY ANGIOPLASTY WITH STENT PLACEMENT  y/2013   "1; makes total of 3" (05/02/2012)  . CORONARY ARTERY BYPASS GRAFT  06/13/2012   Procedure: CORONARY ARTERY BYPASS GRAFTING (CABG);  Surgeon: Grace Isaac, MD;  Location: [place];  Service: Open Heart Surgery;  Laterality: N/A;  cabg x four;  using left internal mammary artery, and left leg greater saphenous vein harvested endoscopically  . DILATION AND CURETTAGE OF UTERUS    . ESOPHAGOGASTRODUODENOSCOPY  01/20/2012   Procedure: ESOPHAGOGASTRODUODENOSCOPY (EGD);  Surgeon: Ladene Artist, MD,FACG;  Location: [facility] ENDOSCOPY;  Service: Endoscopy;  Laterality: N/A;  . ESOPHAGOGASTRODUODENOSCOPY N/A 03/26/2013   Procedure: ESOPHAGOGASTRODUODENOSCOPY (EGD);  Surgeon: Irene Shipper, MD;  Location: [facility] ENDOSCOPY;  Service: Endoscopy;  Laterality: N/A;  . ESOPHAGOGASTRODUODENOSCOPY N/A 04/30/2015   Procedure: ESOPHAGOGASTRODUODENOSCOPY (EGD);  Surgeon: Rogene Houston, MD;  [facility];  Service: Endoscopy;  Laterality: N/A;  1pm - moved to 10/20 @ 1:10  . INTRAOPERATIVE TRANSESOPHAGEAL ECHOCARDIOGRAM  06/13/2012   Procedure: INTRAOPERATIVE TRANSESOPHAGEAL ECHOCARDIOGRAM;  Surgeon: Grace Isaac, MD;  Location: [place];  Service: Open Heart Surgery;  Laterality: N/A;  . LEFT HEART CATHETERIZATION WITH CORONARY ANGIOGRAM N/A 12/15/2011   Procedure: LEFT HEART CATHETERIZATION WITH CORONARY ANGIOGRAM;  Surgeon: Burnell Blanks, MD;  Location: [facility] CATH LAB;  Service: Cardiovascular;  Laterality: N/A;  . LEFT HEART CATHETERIZATION WITH CORONARY ANGIOGRAM N/A 01/10/2012   Procedure: LEFT HEART CATHETERIZATION WITH CORONARY ANGIOGRAM;  Surgeon:  Peter M Martinique, MD;  Location: [facility] CATH LAB;  Service: Cardiovascular;  Laterality: N/A;  . LEFT HEART CATHETERIZATION WITH CORONARY ANGIOGRAM N/A 06/08/2012   Procedure: LEFT HEART CATHETERIZATION WITH CORONARY ANGIOGRAM;  Surgeon: Burnell Blanks, MD;  Location: [facility] CATH LAB;  Service: Cardiovascular;  Laterality: N/A;  . LEFT HEART CATHETERIZATION WITH CORONARY/GRAFT ANGIOGRAM N/A 12/10/2013   Procedure: LEFT HEART CATHETERIZATION WITH Beatrix Fetters;  Surgeon: [name], MD;  Location: [facility] CATH LAB;  Service: Cardiovascular;  Laterality: N/A;  . OVARY SURGERY     ovarian cancer  . SHUNTOGRAM N/A 10/15/2013   Procedure: Fistulogram;  Surgeon: Serafina Mitchell, MD;  Location: [facility] CATH LAB;  Service: Cardiovascular;  Laterality: N/A;  . THROMBECTOMY / ARTERIOVENOUS GRAFT REVISION  2011   left upper arm  . TUBAL LIGATION  1980's     reports that she has never smoked. She has never used smokeless tobacco. She reports that she does not drink alcohol or use drugs.  Allergy: ASA, contrast, PPI, CCB, Bactrim, penicillin, morphine, nitrofurantoin  Family History  Problem Relation Age of Onset  .  Heart disease Mother     Heart Disease before age 76  . Hyperlipidemia Mother   . Hypertension Mother   . Diabetes Mother   . Heart attack Mother   . Heart disease Father     Heart Disease before age 13  . Hyperlipidemia Father   . Hypertension Father   . Diabetes Father   . Diabetes Sister   . Hypertension Sister   . Diabetes Brother   . Hyperlipidemia Brother   . Heart attack Brother   . Hypertension Sister   . Heart attack Brother   . Other      noncontributory for early CAD  . Colon cancer Neg Hx   . Esophageal cancer Neg Hx   . Liver disease Neg Hx   . Kidney disease Neg Hx   . Colon polyps Neg Hx      Prior to Admission medications   Medication Sig Start Date End Date Taking? Authorizing Provider  ALPRAZolam Duanne Moron) 0.25 MG tablet Take 1-2 tablets by mouth  at bedtime as needed for sleep.  11/19/15  Yes Historical Provider, MD  amiodarone (PACERONE) 200 MG tablet Take 1 tablet (200 mg total) by mouth daily. 09/23/15  Yes Herminio Commons, MD  aspirin EC 81 MG tablet Take 81 mg by mouth daily.    Yes Historical Provider, MD  cloNIDine (CATAPRES) 0.1 MG tablet Take 1 tablet (0.1 mg total) by mouth 2 (two) times daily. 04/08/16  Yes Orvan Falconer, MD  epoetin alfa (EPOGEN,PROCRIT) 85631 UNIT/ML injection Inject 1 mL (10,000 Units total) into the vein every Monday, Wednesday, and Friday with hemodialysis. 04/11/16  Yes Orvan Falconer, MD  fluticasone (FLONASE) 50 MCG/ACT nasal spray Place 2 sprays into the nose daily as needed for allergies.    Yes Historical Provider, MD  labetalol (NORMODYNE) 200 MG tablet Take 1 tablet (200 mg total) by mouth 2 (two) times daily. 04/08/16  Yes Orvan Falconer, MD  lanthanum (FOSRENOL) 1000 MG chewable tablet Chew 1 tablet (1,000 mg total) by mouth 3 (three) times daily with meals. 04/08/16  Yes Orvan Falconer, MD  multivitamin (RENA-VIT) TABS tablet Take 1 tablet by mouth daily.   Yes Historical Provider, MD  nitroGLYCERIN (NITROSTAT) 0.4 MG SL tablet Place 1 tablet (0.4 mg total) under the tongue every 5 (five) minutes as needed for chest pain. 04/13/16 07/12/16 Yes Herminio Commons, MD  omeprazole (PRILOSEC) 20 MG capsule TAKE ONE CAPSULE BY MOUTH ONCE DAILY 04/15/16  Yes Butch Penny, NP  PROAIR HFA 108 (90 BASE) MCG/ACT inhaler Inhale 1 puff into the lungs every 6 (six) hours as needed for wheezing or shortness of breath.  01/09/15  Yes Historical Provider, MD  SENSIPAR 30 MG tablet Take 30 mg by mouth daily with supper.  12/21/12  Yes Historical Provider, MD  simvastatin (ZOCOR) 20 MG tablet Take 1 tablet (20 mg total) by mouth at bedtime. 04/04/16  Yes Lendon Colonel, NP    Physical Exam: Vitals:   05/09/16 0110 05/09/16 0115 05/09/16 0120 05/09/16 0130  BP: (!) 227/115 (!) 203/111 (!) 223/109 (!) 210/92  Pulse: 81 80 77 77  Resp: 15  17 18 18   SpO2: 98% 98% 97% 98%  Weight:      Height:          Constitutional: NAD, calm, comfortable Eyes: PERTLA, lids and conjunctivae normal ENMT: Mucous membranes are moist. Posterior pharynx clear of any exudate or lesions.   Neck: normal, supple, no masses, no thyromegaly Respiratory:  Faint wheeze at left mid-lung zone, otherwise clear. Normal respiratory effort.  Cardiovascular: S1 & S2 heard, regular rate and rhythm. No extremity edema. No significant JVD. Abdomen: No distension, no tenderness, no masses palpated. Bowel sounds normal.  Musculoskeletal: no clubbing / cyanosis. No joint deformity upper and lower extremities. Normal muscle tone. AVF in proximal LUE.  Skin: no significant rashes, lesions, ulcers. Warm, dry, well-perfused. Neurologic: CN 2-12 grossly intact. Sensation intact, DTR normal. Strength 5/5 in all 4 limbs.  Psychiatric: Normal judgment and insight. Alert and oriented x 3. Normal mood and affect.     Labs on Admission: I have personally reviewed following labs and imaging studies  CBC:  Recent Labs Lab 05/08/16 2326  WBC 7.3  NEUTROABS 5.1  HGB 10.5*  HCT 34.0*  MCV 101.8*  PLT 253   Basic Metabolic Panel:  Recent Labs Lab 05/08/16 2326  NA 142  K 4.9  CL 100*  CO2 28  GLUCOSE 120*  BUN 65*  CREATININE 11.09*  CALCIUM 8.8*   GFR: Estimated Creatinine Clearance: 3.6 mL/min (by C-G formula based on SCr of 11.09 mg/dL (H)). Liver Function Tests:  Recent Labs Lab 05/08/16 2326  AST 18  ALT 13*  ALKPHOS 79  BILITOT 0.7  PROT 6.9  ALBUMIN 3.7   No results for input(s): LIPASE, AMYLASE in the last 168 hours. No results for input(s): AMMONIA in the last 168 hours. Coagulation Profile:  Recent Labs Lab 05/08/16 2351  INR 0.92   Cardiac Enzymes:  Recent Labs Lab 05/08/16 2326  TROPONINI 0.04*   BNP (last 3 results) No results for input(s): PROBNP in the last 8760 hours. HbA1C: No results for input(s): HGBA1C in the  last 72 hours. CBG: No results for input(s): GLUCAP in the last 168 hours. Lipid Profile: No results for input(s): CHOL, HDL, LDLCALC, TRIG, CHOLHDL, LDLDIRECT in the last 72 hours. Thyroid Function Tests: No results for input(s): TSH, T4TOTAL, FREET4, T3FREE, THYROIDAB in the last 72 hours. Anemia Panel: No results for input(s): VITAMINB12, FOLATE, FERRITIN, TIBC, IRON, RETICCTPCT in the last 72 hours. Urine analysis:    Component Value Date/Time   COLORURINE YELLOW 12/16/2012 1919   APPEARANCEUR CLOUDY (A) 12/16/2012 1919   LABSPEC 1.009 12/16/2012 1919   PHURINE 7.5 12/16/2012 1919   GLUCOSEU NEGATIVE 12/16/2012 1919   HGBUR TRACE (A) 12/16/2012 1919   BILIRUBINUR NEGATIVE 12/16/2012 1919   KETONESUR NEGATIVE 12/16/2012 1919   PROTEINUR 100 (A) 12/16/2012 1919   UROBILINOGEN 0.2 12/16/2012 1919   NITRITE NEGATIVE 12/16/2012 1919   LEUKOCYTESUR SMALL (A) 12/16/2012 1919   Sepsis Labs: @LABRCNTIP (procalcitonin:4,lacticidven:4) )No results found for this or any previous visit (from the past 240 hour(s)).   Radiological Exams on Admission: Dg Chest Port 1 View  Result Date: 05/09/2016 CLINICAL DATA:  Chest pain radiating into the back, onset tonight during bowel movement. Minimal relief with nitroglycerin. EXAM: PORTABLE CHEST 1 VIEW COMPARISON:  04/06/2016 FINDINGS: Minimal linear scarring or atelectasis in the right base. No confluent airspace consolidation. No large effusion. No pneumothorax. Mild respiratory motion degradation of the image. Unchanged mild cardiomegaly. IMPRESSION: Unchanged cardiomegaly.  No consolidation or large effusion. Electronically Signed   By: Andreas Newport M.D.   On: 05/09/2016 00:07    EKG: Independently reviewed. Sinus tachycardia (rate 104), T-wave abnormality in inferolateral leads more prominent than prior.   Assessment/Plan  1. Hypertensive crisis with chest pain  - BP 230/130 on arrival with CP, EKG changes, and borderline elevation in  troponin  -  There is no HA, visual disturbance, or focal neurologic deficit; no pulmonary edema - She has been started on labetalol and nitroglycerin infusions; will titrate with goal of bringing pressure down by 20-25% for now  - ASA 324 mg has been given; continue beta-blocker and nitrate, continue statin - Monitor on telemetry for ischemic changes, trend troponin, repeat EKG    2. CAD  - Hx of multiple stents, CABG in 2013, followed by Shawna. Bronson Ing in outpatient setting  - She had normal pharmacologic stress test in February 2016  - There is acute CP with EKG change and borderline troponin being managed as above  - Continue statin, ASA, beta-blocker   3. ESRD  - Managed with HD on MWF schedule via AVF in LUE  - She reports completing HD on 05/06/16 without incident   - She does not appear to have any emergent indication for HD on admission  - Nephrology consultation requested for maintenance HD    4. Chronic diastolic CHF  - TTE (01/13/63) with EF 33-29%, grade 2 diastolic dysfunction, mild LVH, moderate MR, mild TR and LAE - Continue beta-blocker as above; pt is anuric  - SLIV, fluid-restrict diet    5. Chronic anemia  - Hgb is 10.5 on admission and stable relative to priors  - No sign of bleeding  - Attributed to ESRD and managed with epoetin    DVT prophylaxis: sq heparin  Code Status: Full  Family Communication: Husband updated at bedside Disposition Plan: Admit to stepdown Consults called: Cardiology  Admission status: Inpatient    Vianne Bulls, MD [facility] Pager [phone]  If 7PM-7AM, please contact night-coverage [email or web address] Password [email or web address]  05/09/2016, 1:38 AM

## 2016-05-09 NOTE — Procedures (Signed)
   HEMODIALYSIS TREATMENT NOTE:  4 hour heparin-free dialysis completed via left upper arm AVF (15g/antegrade). Goal met: 2.1 liters removed without interruption in ultrafiltration.  Increasing hypertension throughout HD session; Labetalol gtt resumed by primary RN.  Pt also became increasingly restless; extra dose Xanax ordered/given.  All blood was returned and hemostasis was achieved within 15 minutes. Report given to [facility], RN.  Rockwell Alexandria, RN, CDN

## 2016-05-10 DIAGNOSIS — Z992 Dependence on renal dialysis: Secondary | ICD-10-CM | POA: Diagnosis not present

## 2016-05-10 DIAGNOSIS — N186 End stage renal disease: Secondary | ICD-10-CM | POA: Diagnosis not present

## 2016-05-10 LAB — HEPATITIS B SURFACE ANTIGEN: Hepatitis B Surface Ag: NEGATIVE

## 2016-05-10 MED ORDER — LABETALOL HCL 5 MG/ML IV SOLN
10.0000 mg | INTRAVENOUS | Status: DC | PRN
  Administered 2016-05-10 – 2016-05-12 (×5): 10 mg via INTRAVENOUS
  Filled 2016-05-10 (×6): qty 4

## 2016-05-10 MED ORDER — NIFEDIPINE ER OSMOTIC RELEASE 30 MG PO TB24
30.0000 mg | ORAL_TABLET | Freq: Every day | ORAL | Status: DC
  Administered 2016-05-10: 30 mg via ORAL
  Filled 2016-05-10 (×2): qty 1

## 2016-05-10 NOTE — Progress Notes (Signed)
PROGRESS NOTE  Shawna [name] WTU:[id number] DOB: January 11, 1941 DOA: 05/08/2016 PCP: [name], MD  Brief Narrative: 75 year old woman PMH coronary artery disease, end-stage renal disease, presented with acute onset of chest pain unrelieved by aspirin and nitroglycerin. In the emergency department BP 230/130, noted to have new ST depression. Treated with nitroglycerin infusion and IV labetalol. Admitted for hypertensive crisis with chest pain. Crisis and chest pain resolved but blood pressure remained high and this required restarting labetalol infusion 10/30. Currently off IV infusions. Plan to monitor blood pressure today and discharge home once stable.  Assessment/Plan: 1. Hypertensive crisis with chest pain. Hypertensive crisis resolved, chest pain resolved but she remains markedly hypertensive. 2. Coronary artery disease status post CABG, stenting. Asymptomatic 3. Chronic diastolic congestive heart failure remains stable 4. PSVT, maintained on amiodarone. Remains in sinus rhythm 5. Myoclonus, secondary. Suspect secondary to CKD/ESRD on HD. No history or objective findings to suggest acute CNS process. Reviewed meds, no likely offenders. Monitor for now. 6. End-stage renal disease, management per nephrology 7. Elevated TSH. Recheck as an outpatient. 8. Anemia of chronic kidney disease.    Remains hypertensive but overall improved. Currently off labetalol infusion. Follow her blood pressure today after administration of morning medications. Currently systolic 179 or greater and not yet a candidate for discharge. However her chest pain has resolved and she has no focal neurologic deficits.  Agree with starting nifedipine per nephrology.  DVT prophylaxis: heparin  Code Status: full code Family Communication: Husband at bedside Disposition Plan: Home  Murray Hodgkins, MD  [facility] Direct contact: [phone] --[place]ia [place]  --[email or web address]; password [email or web address]  7PM-7AM  contact night coverage as above 05/10/2016, 8:46 AM  LOS: 1 day   Consultants:  Cardiology   Procedures:    Antimicrobials:    CC: f/u HTN crisis  Interval history/Subjective: Completed dialysis yesterday, noted, hypertensive yesterday afternoon and labetalol infusion was started.  Currently off labetalol. Overall feels better. No headache, vomiting, chest pain or shortness of breath. Does describe "jerkiness" arms and legs which has been worse over the last 1-2 weeks and intermittent prior to that.  Objective: Vitals:   05/10/16 0400 05/10/16 0500 05/10/16 0600 05/10/16 0742  BP: (!) 204/90 (!) 193/88 (!) 191/87   Pulse: 69 68 69   Resp: 19 19 (!) 21   Temp: 98.2 F (36.8 C)   98.6 F (37 C)  TempSrc: Oral   Oral  SpO2: 98% 97% 100%   Weight:  59.9 kg (132 lb 0.9 oz)    Height:        Intake/Output Summary (Last 24 hours) at 05/10/16 0846 Last data filed at 05/10/16 0600  Gross per 24 hour  Intake          1008.23 ml  Output             2102 ml  Net         -1093.77 ml     Filed Weights   05/09/16 0300 05/09/16 1325 05/10/16 0500  Weight: 61.3 kg (135 lb 2.3 oz) 61.5 kg (135 lb 9.3 oz) 59.9 kg (132 lb 0.9 oz)    Exam:   Constitutional:   Appears calm, comfortable. Eyes:   Pupils, irises, lids appear unremarkable. ENMT:   Lips and tongue appear unremarkable. Respiratory:   Clear to auscultation bilaterally. No wheezes, rales or rhonchi. Normal respiratory effort. Cardiovascular:   Regular rate and rhythm. No murmur, rub or gallop. No lower extremity edema.  Telemetry sinus rhythm Musculoskeletal:  Moves all extremities to command with grossly normal tone and strength.  Myoclonic-like jerks noted of the upper and lower extremities. No definite clonus. Neurologic:   Cranial nerves appear grossly intact. No focal deficits noted. Psychiatric:   Alert  Mood, affect, judgment and insight appear grossly normal   I have personally reviewed  following labs and imaging studies:  No new data  Scheduled Meds: . amiodarone  200 mg Oral Daily  . aspirin EC  81 mg Oral Daily  . cloNIDine  0.1 mg Oral BID  . heparin  5,000 Units Subcutaneous Q8H  . labetalol  200 mg Oral BID  . multivitamin  1 tablet Oral Daily  . NIFEdipine  30 mg Oral Daily  . pantoprazole  40 mg Oral Daily  . simvastatin  20 mg Oral QHS   Continuous Infusions: . labetalol (NORMODYNE) infusion 2.5 mg/min (05/10/16 0600)    Principal Problem:   Hypertensive crisis Active Problems:   Coronary atherosclerosis of native coronary artery   ESRD on hemodialysis (HCC)   Anemia of chronic kidney failure   Chronic diastolic CHF (congestive heart failure) (H[facility])   Chest pain   Hypertensive crisis without congestive heart failure   LOS: 1 day

## 2016-05-10 NOTE — Progress Notes (Signed)
Subjective: Interval History: has no complaint of chest pain. Patient also denies and difficulty breathing. Overall she seems to be feeling much better..  Objective: Vital signs in last 24 hours: Temp:  [97.4 F (36.3 C)-98.2 F (36.8 C)] 98.2 F (36.8 C) (10/31 0400) Pulse Rate:  [62-70] 69 (10/31 0600) Resp:  [9-23] 21 (10/31 0600) BP: (135-247)/(65-112) 191/87 (10/31 0600) SpO2:  [97 %-100 %] 100 % (10/31 0600) Weight:  [59.9 kg (132 lb 0.9 oz)-61.5 kg (135 lb 9.3 oz)] 59.9 kg (132 lb 0.9 oz) (10/31 0500) Weight change: 1.172 kg (2 lb 9.3 oz)  Intake/Output from previous day: 10/30 0701 - 10/31 0700 In: 1075.5 [P.O.:600; I.V.:475.5] Out: 2102  Intake/Output this shift: No intake/output data recorded.  General appearance: alert, cooperative and no distress Resp: clear to auscultation bilaterally Cardio: regular rate and rhythm GI: soft, non-tender; bowel sounds normal; no masses,  no organomegaly Extremities: extremities normal, atraumatic, no cyanosis or edema  Lab Results:  Recent Labs  05/08/16 2326 05/09/16 1430  WBC 7.3 6.2  HGB 10.5* 9.0*  HCT 34.0* 29.4*  PLT 307 254   BMET:  Recent Labs  05/08/16 2326 05/09/16 1430  NA 142 135  135  K 4.9 3.9  3.9  CL 100* 99*  99*  CO2 28 27  27   GLUCOSE 120* 112*  111*  BUN 65* 43*  42*  CREATININE 11.09* 7.39*  7.56*  CALCIUM 8.8* 7.9*  7.9*   No results for input(s): PTH in the last 72 hours. Iron Studies: No results for input(s): IRON, TIBC, TRANSFERRIN, FERRITIN in the last 72 hours.  Studies/Results: Dg Chest Port 1 View  Result Date: 05/09/2016 CLINICAL DATA:  Chest pain radiating into the back, onset tonight during bowel movement. Minimal relief with nitroglycerin. EXAM: PORTABLE CHEST 1 VIEW COMPARISON:  04/06/2016 FINDINGS: Minimal linear scarring or atelectasis in the right base. No confluent airspace consolidation. No large effusion. No pneumothorax. Mild respiratory motion degradation of the  image. Unchanged mild cardiomegaly. IMPRESSION: Unchanged cardiomegaly.  No consolidation or large effusion. Electronically Signed   By: Andreas Newport M.D.   On: 05/09/2016 00:07    I have reviewed the patient's current medications.  Assessment/Plan: Problem #1 hypertension: Her blood pressure at this moment seems to be still high. Patient does not have any headache. Problem #2 end-stage renal disease: She is status post hemodialysis yesterday. She doesn't have any uremic signs and symptoms Problem #3 anxiety disorder Problem #4 anemia: Her hemoglobin is below our target goal. Presently she is on Epogen. Problem #5 fluid management: Patient status post hemodialysis yesterday with 2 L ultrafiltration and presently she doesn't have any difficulty breathing. Problem #6 metabolic bone disease: Her calcium and phosphorus and phosphorus is low. Presently patient is on Sensipar and Fosrenol Problem #7 chest pain: Seems to be feeling better. And presently patient is being followed by cardiology.  Plan: 1]Will DC Sensipar and Fosrenol. 2] will start patient on nifedipine XL 30 mg by mouth daily 3] will check renal panel in the morning 4] will make arrangements for patient to get dialysis tomorrow.   LOS: 1 day   Richar Dunklee S 05/10/2016,7:37 AM

## 2016-05-10 NOTE — Progress Notes (Signed)
Primary cardiologist:  Subjective:    No chest pain this AM.   Objective:   Temp:  [97.4 F (36.3 C)-98.6 F (37 C)] 98.6 F (37 C) (10/31 0742) Pulse Rate:  [62-70] 69 (10/31 0600) Resp:  [9-23] 21 (10/31 0600) BP: (135-247)/(65-112) 191/87 (10/31 0600) SpO2:  [97 %-100 %] 100 % (10/31 0600) Weight:  [132 lb 0.9 oz (59.9 kg)-135 lb 9.3 oz (61.5 kg)] 132 lb 0.9 oz (59.9 kg) (10/31 0500) Last BM Date: 05/08/16  Filed Weights   05/09/16 0300 05/09/16 1325 05/10/16 0500  Weight: 135 lb 2.3 oz (61.3 kg) 135 lb 9.3 oz (61.5 kg) 132 lb 0.9 oz (59.9 kg)    Intake/Output Summary (Last 24 hours) at 05/10/16 0840 Last data filed at 05/10/16 0600  Gross per 24 hour  Intake          1008.23 ml  Output             2102 ml  Net         -1093.77 ml    Telemetry: NSR  Exam:  General: NAD  HEENT:sclera clear, throat clear  Resp: CTAB  Cardiac: RRR, no m/r/g, no jvd  CN:OBSJGGE soft, NT, ND  MSK:no LE edema  Neuro: no focal deficits  Psych: appropriate affect  Lab Results:  Basic Metabolic Panel:  Recent Labs Lab 05/08/16 2326 05/09/16 1430  NA 142 135  135  K 4.9 3.9  3.9  CL 100* 99*  99*  CO2 28 27  27   GLUCOSE 120* 112*  111*  BUN 65* 43*  42*  CREATININE 11.09* 7.39*  7.56*  CALCIUM 8.8* 7.9*  7.9*    Liver Function Tests:  Recent Labs Lab 05/08/16 2326 05/09/16 1430  AST 18  --   ALT 13*  --   ALKPHOS 79  --   BILITOT 0.7  --   PROT 6.9  --   ALBUMIN 3.7 3.2*    CBC:  Recent Labs Lab 05/08/16 2326 05/09/16 1430  WBC 7.3 6.2  HGB 10.5* 9.0*  HCT 34.0* 29.4*  MCV 101.8* 101.7*  PLT 307 254    Cardiac Enzymes:  Recent Labs Lab 05/08/16 2326 05/09/16 0225 05/09/16 1430  TROPONINI 0.04* 0.05* 0.05*    BNP: No results for input(s): PROBNP in the last 8760 hours.  Coagulation:  Recent Labs Lab 05/08/16 2351  INR 0.92    ECG:   Medications:   Scheduled Medications: . amiodarone  200 mg Oral Daily  .  aspirin EC  81 mg Oral Daily  . cloNIDine  0.1 mg Oral BID  . heparin  5,000 Units Subcutaneous Q8H  . labetalol  200 mg Oral BID  . multivitamin  1 tablet Oral Daily  . NIFEdipine  30 mg Oral Daily  . pantoprazole  40 mg Oral Daily  . simvastatin  20 mg Oral QHS     Infusions: . labetalol (NORMODYNE) infusion 2.5 mg/min (05/10/16 0600)     PRN Medications:  sodium chloride, sodium chloride, acetaminophen, ALPRAZolam, fluticasone, ipratropium-albuterol, lidocaine (PF), lidocaine-prilocaine, ondansetron (ZOFRAN) IV, pentafluoroprop-tetrafluoroeth     Assessment/Plan   1. Hypertensive emergency - admitted with severe hypertension SBP 230, multiple admits in the past with HTN urgency/emergency - home regimen clonidine 0.1mg  bid, labetlol 200mg  bid - from discussions with nephrology the patient had done fairly well when on procardia in the past, though she had some LE edema at times. She has also had issues with severe symptomatic hypotension on hemodialysis -  agree with renal by starting procardia  - she is off both IV labetolol and NG this AM, SBP in 170s prior to AM meds.    2. Elevated troponin -mild elevation flat elevation in setting of severe HTN. Chest pain resolved with improved blood pressure. She does have a history of CAD. At this time favor likely demand ischemia in setting of severe HTN as opposed to ACS.  - continue bp control and follow symptoms    3. ESRD - pt had HD yesterday, plans for HD tomorrow.         Carlyle Dolly, M.D..Patient ID: Shawna [name], female   DOB: [birth year], 75 y.o.   MRN: [id number]

## 2016-05-11 ENCOUNTER — Inpatient Hospital Stay (HOSPITAL_COMMUNITY): Payer: Medicare HMO

## 2016-05-11 DIAGNOSIS — I469 Cardiac arrest, cause unspecified: Secondary | ICD-10-CM

## 2016-05-11 LAB — RENAL FUNCTION PANEL
ALBUMIN: 3.6 g/dL (ref 3.5–5.0)
ANION GAP: 14 (ref 5–15)
Albumin: 3.1 g/dL — ABNORMAL LOW (ref 3.5–5.0)
Anion gap: 13 (ref 5–15)
BUN: 47 mg/dL — AB (ref 6–20)
BUN: 19 mg/dL (ref 6–20)
CALCIUM: 7.9 mg/dL — AB (ref 8.9–10.3)
CALCIUM: 9.2 mg/dL (ref 8.9–10.3)
CHLORIDE: 92 mmol/L — AB (ref 101–111)
CO2: 26 mmol/L (ref 22–32)
CO2: 27 mmol/L (ref 22–32)
CREATININE: 8.58 mg/dL — AB (ref 0.44–1.00)
Chloride: 96 mmol/L — ABNORMAL LOW (ref 101–111)
Creatinine, Ser: 4.08 mg/dL — ABNORMAL HIGH (ref 0.44–1.00)
GFR calc non Af Amer: 10 mL/min — ABNORMAL LOW (ref >60)
GFR, EST AFRICAN AMERICAN: 5 mL/min — AB (ref >60)
GFR, EST AFRICAN AMERICAN: 11 mL/min — AB (ref >60)
GFR, EST NON AFRICAN AMERICAN: 4 mL/min — AB (ref >60)
Glucose, Bld: 89 mg/dL (ref 65–99)
Glucose, Bld: 100 mg/dL — ABNORMAL HIGH (ref 65–99)
PHOSPHORUS: 4.2 mg/dL (ref 2.5–4.6)
Phosphorus: 7 mg/dL — ABNORMAL HIGH (ref 2.5–4.6)
Potassium: 6.4 mmol/L (ref 3.5–5.1)
Potassium: 4 mmol/L (ref 3.5–5.1)
SODIUM: 131 mmol/L — AB (ref 135–145)
SODIUM: 137 mmol/L (ref 135–145)

## 2016-05-11 LAB — CBC
HEMATOCRIT: 31.8 % — AB (ref 36.0–46.0)
Hemoglobin: 9.7 g/dL — ABNORMAL LOW (ref 12.0–15.0)
MCH: 31.4 pg (ref 26.0–34.0)
MCHC: 30.5 g/dL (ref 30.0–36.0)
MCV: 102.9 fL — AB (ref 78.0–100.0)
Platelets: 263 10*3/uL (ref 150–400)
RBC: 3.09 MIL/uL — AB (ref 3.87–5.11)
RDW: 17.3 % — ABNORMAL HIGH (ref 11.5–15.5)
WBC: 8.8 10*3/uL (ref 4.0–10.5)

## 2016-05-11 LAB — BLOOD GAS, ARTERIAL
Acid-Base Excess: 5.2 mmol/L — ABNORMAL HIGH (ref 0.0–2.0)
BICARBONATE: 29.8 mmol/L — AB (ref 20.0–28.0)
DRAWN BY: 23534
FIO2: 100
MECHVT: 450 mL
O2 CONTENT: 100 L/min
O2 Saturation: 97.9 %
PEEP: 5 cmH2O
RATE: 18 resp/min
pCO2 arterial: 30.5 mmHg — ABNORMAL LOW (ref 32.0–48.0)
pH, Arterial: 7.566 — ABNORMAL HIGH (ref 7.350–7.450)
pO2, Arterial: 433 mmHg — ABNORMAL HIGH (ref 83.0–108.0)

## 2016-05-11 LAB — COMPREHENSIVE METABOLIC PANEL
ALT: 629 U/L — AB (ref 14–54)
AST: 711 U/L — AB (ref 15–41)
Albumin: 2.9 g/dL — ABNORMAL LOW (ref 3.5–5.0)
Alkaline Phosphatase: 76 U/L (ref 38–126)
BUN: 50 mg/dL — ABNORMAL HIGH (ref 6–20)
CALCIUM: 10.5 mg/dL — AB (ref 8.9–10.3)
CO2: 17 mmol/L — AB (ref 22–32)
CREATININE: 9.09 mg/dL — AB (ref 0.44–1.00)
Chloride: 92 mmol/L — ABNORMAL LOW (ref 101–111)
GFR, EST AFRICAN AMERICAN: 4 mL/min — AB (ref >60)
GFR, EST NON AFRICAN AMERICAN: 4 mL/min — AB (ref >60)
Glucose, Bld: 235 mg/dL — ABNORMAL HIGH (ref 65–99)
Potassium: 6.3 mmol/L (ref 3.5–5.1)
Sodium: 133 mmol/L — ABNORMAL LOW (ref 135–145)
TOTAL PROTEIN: 5.4 g/dL — AB (ref 6.5–8.1)
Total Bilirubin: 0.6 mg/dL (ref 0.3–1.2)

## 2016-05-11 LAB — TROPONIN I
TROPONIN I: 0.05 ng/mL — AB (ref <0.03)
Troponin I: 1.27 ng/mL (ref <0.03)
Troponin I: 1.53 ng/mL (ref <0.03)

## 2016-05-11 LAB — GLUCOSE, CAPILLARY: Glucose-Capillary: 77 mg/dL (ref 65–99)

## 2016-05-11 LAB — MAGNESIUM: MAGNESIUM: 2 mg/dL (ref 1.7–2.4)

## 2016-05-11 IMAGING — CR DG CHEST 1V PORT
1 series · 1 of 1 positions shown · non-contrast
Comparison: [DATE]

CLINICAL DATA: Intubated, post resuscitation

EXAM:
PORTABLE CHEST 1 VIEW

[ap portable]
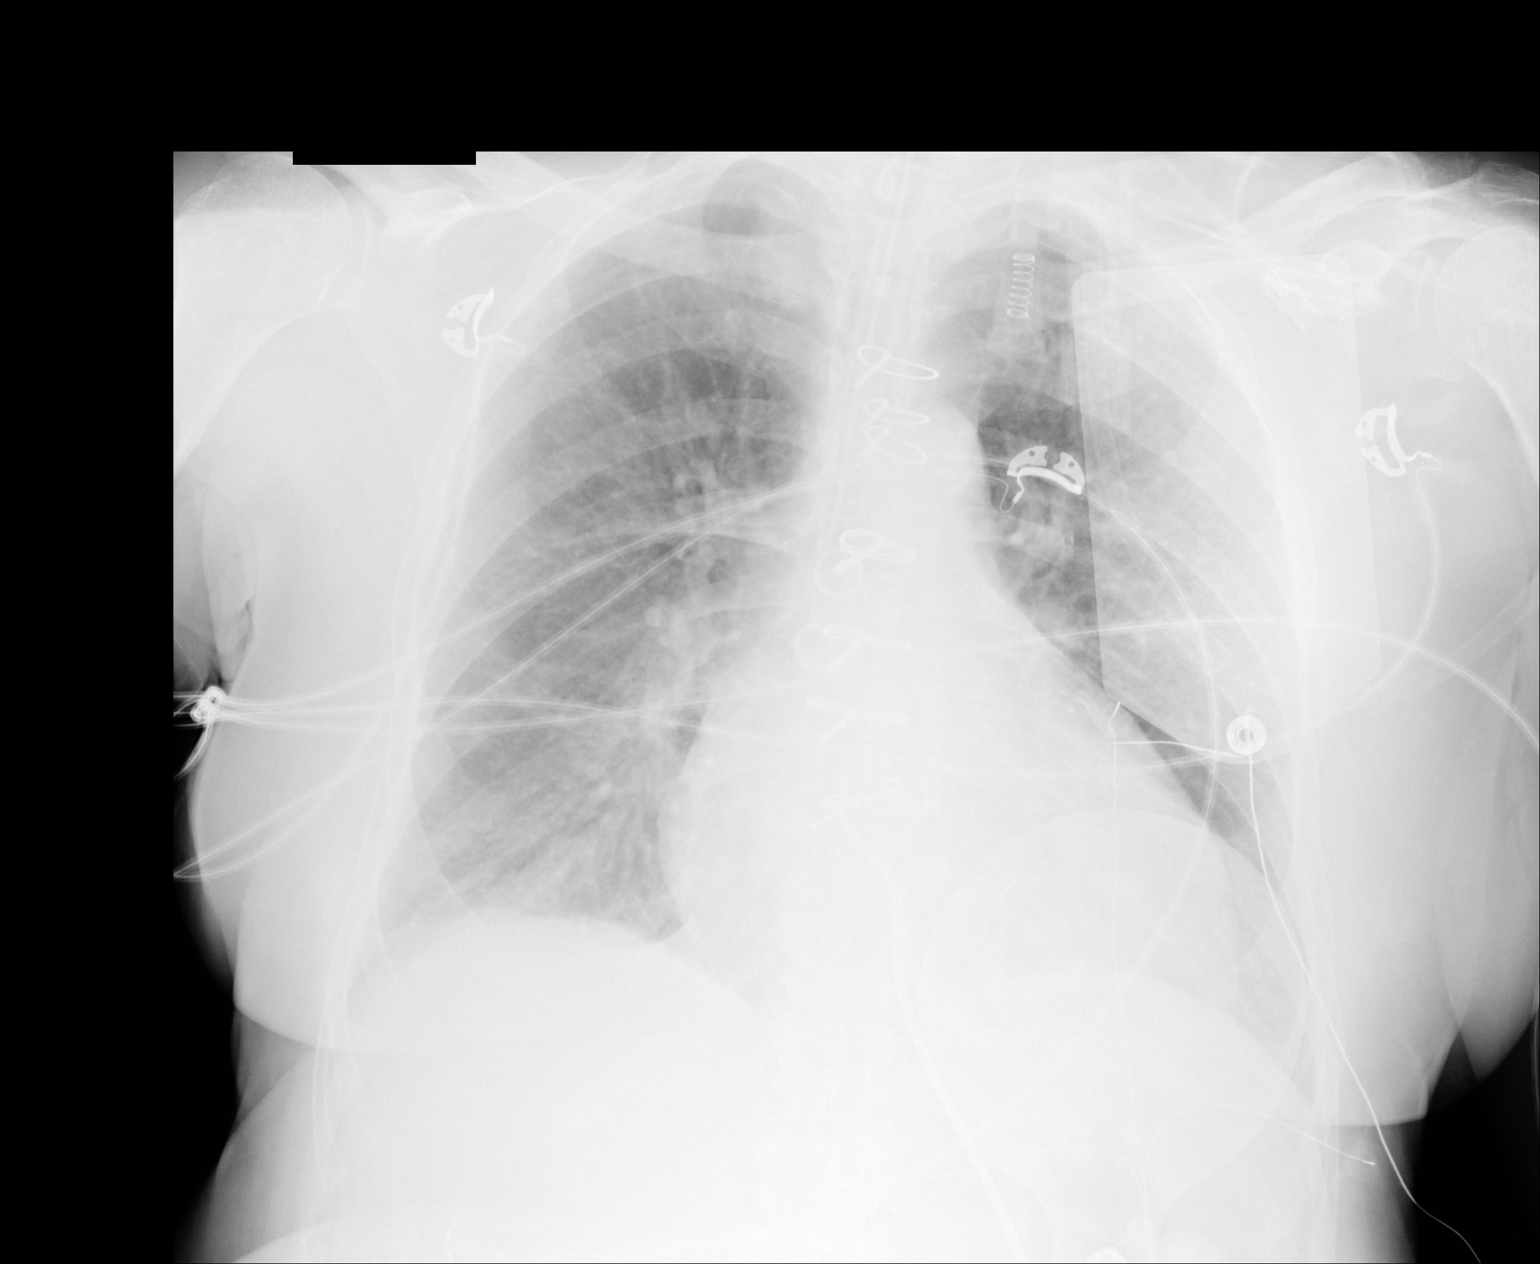

[1 of 1 positions shown; findings below may reference images not displayed]

FINDINGS: Borderline cardiomegaly. Status post CABG. Endotracheal tube at the
level of carina. Endotracheal tube should be retracted about 1 cm.
No pneumothorax. Mild basilar atelectasis. No pulmonary edema.
IMPRESSION: Endotracheal tube at the level of carina. Endotracheal tube should
be retracted about 1 cm. No pneumothorax. No pulmonary edema.

## 2016-05-11 IMAGING — CR DG CHEST 1V PORT
1 series · 1 of 1 positions shown · non-contrast
Comparison: [DATE]

CLINICAL DATA: PICC line placement

EXAM:
PORTABLE CHEST 1 VIEW

[portable]
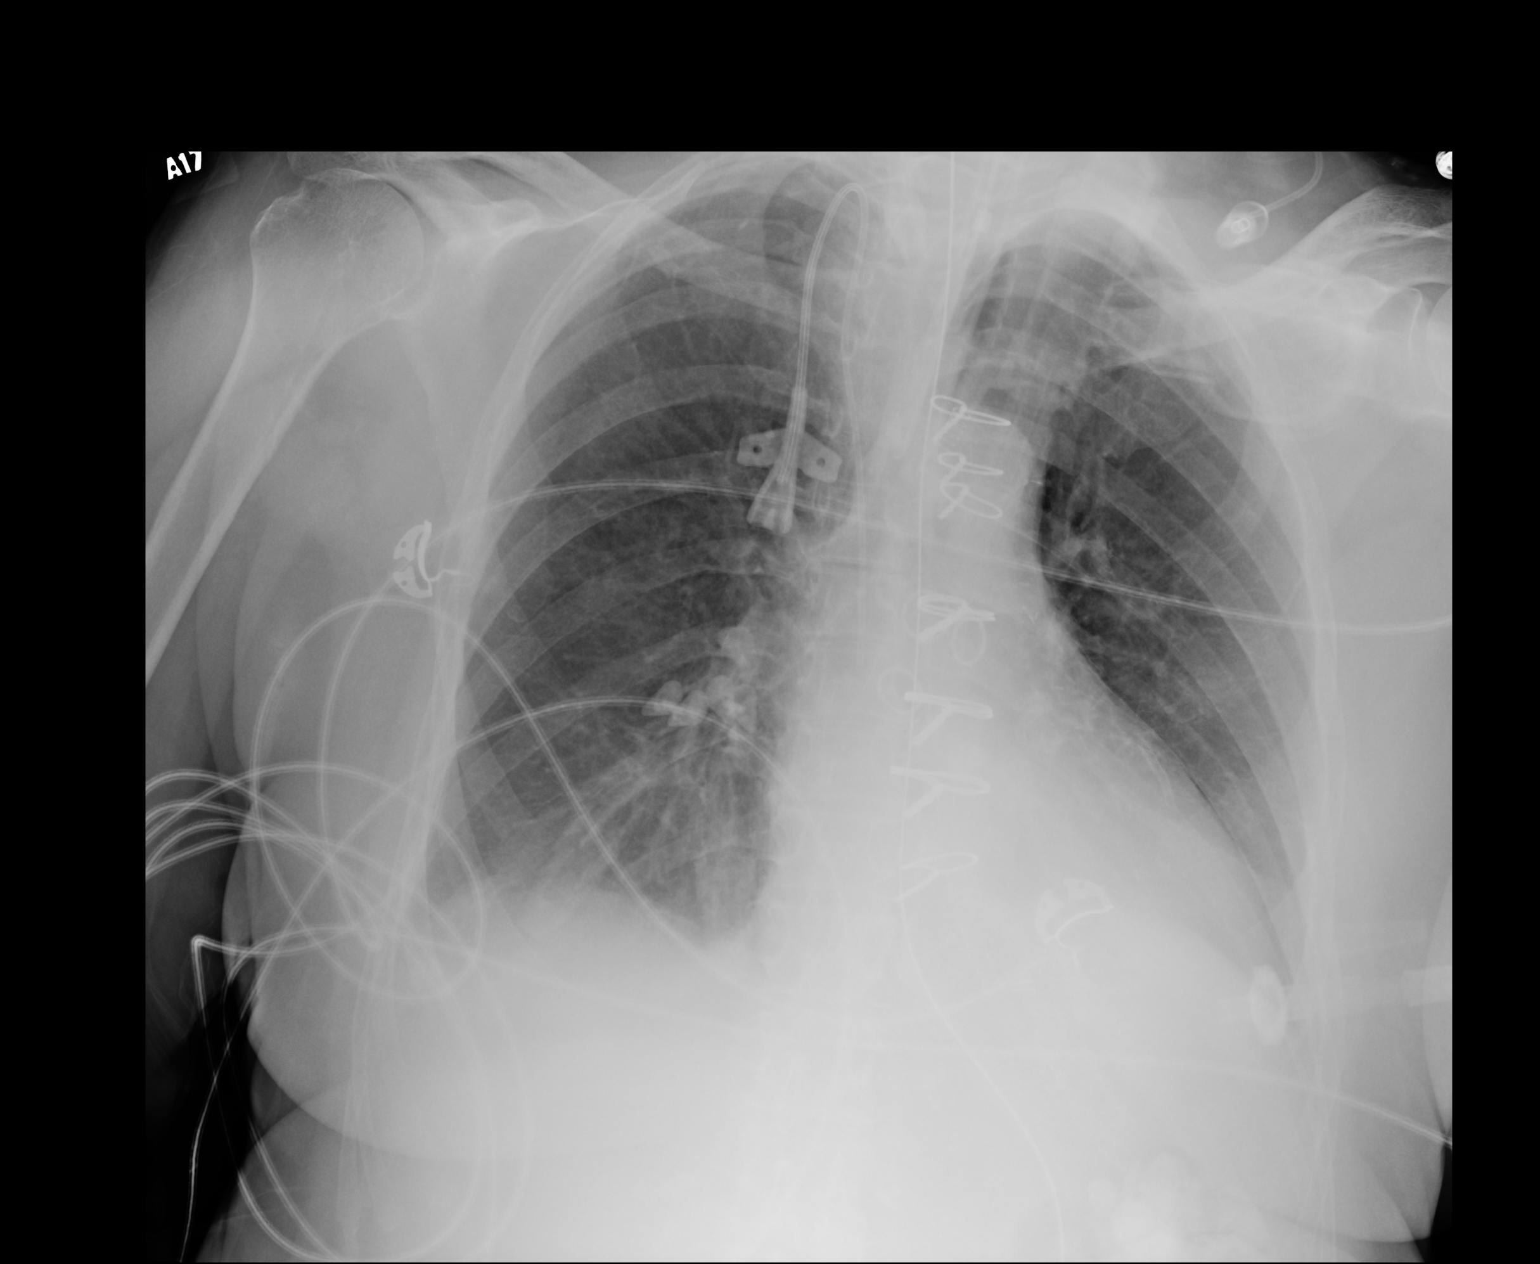

[1 of 1 positions shown; findings below may reference images not displayed]

FINDINGS: Internal jugular PICC line has been placed. The tip is in the SVC.
No pneumothorax. Prior CABG. Heart is borderline in size. There is
bibasilar atelectasis. Suspect small layering effusions.

Endotracheal tube is 3.4 cm above the carina. NG tube enters the
stomach.
IMPRESSION: Right internal jugular PICC line tip is in the SVC. No pneumothorax.

Bibasilar atelectasis.  Suspect layering effusions.

## 2016-05-11 MED ORDER — EPINEPHRINE PF 1 MG/ML IJ SOLN
0.5000 ug/min | INTRAVENOUS | Status: DC
  Administered 2016-05-11: 12 ug/min via INTRAVENOUS
  Filled 2016-05-11: qty 4

## 2016-05-11 MED ORDER — FENTANYL CITRATE (PF) 100 MCG/2ML IJ SOLN
50.0000 ug | INTRAMUSCULAR | Status: DC | PRN
  Administered 2016-05-11 – 2016-05-22 (×49): 50 ug via INTRAVENOUS
  Filled 2016-05-11 (×44): qty 2

## 2016-05-11 MED ORDER — SODIUM CHLORIDE 0.9 % IV SOLN: 1.0000 mg/h | INTRAVENOUS | Status: DC

## 2016-05-11 MED ORDER — FENTANYL CITRATE (PF) 100 MCG/2ML IJ SOLN
50.0000 ug | INTRAMUSCULAR | Status: DC | PRN
  Administered 2016-05-17: 50 ug via INTRAVENOUS
  Filled 2016-05-11 (×7): qty 2

## 2016-05-11 MED ORDER — FAMOTIDINE IN NACL 20-0.9 MG/50ML-% IV SOLN: 20.0000 mg | Freq: Two times a day (BID) | INTRAVENOUS | Status: DC

## 2016-05-11 MED ORDER — SODIUM BICARBONATE 8.4 % IV SOLN
INTRAVENOUS | Status: DC
  Filled 2016-05-11: qty 150

## 2016-05-11 MED ORDER — ORAL CARE MOUTH RINSE
15.0000 mL | Freq: Four times a day (QID) | OROMUCOSAL | Status: DC
  Administered 2016-05-11 – 2016-05-17 (×15): 15 mL via OROMUCOSAL

## 2016-05-11 MED ORDER — ORAL CARE MOUTH RINSE
15.0000 mL | Freq: Four times a day (QID) | OROMUCOSAL | Status: DC
  Administered 2016-05-11 – 2016-05-25 (×41): 15 mL via OROMUCOSAL

## 2016-05-11 MED ORDER — FAMOTIDINE IN NACL 20-0.9 MG/50ML-% IV SOLN
20.0000 mg | INTRAVENOUS | Status: DC
  Administered 2016-05-12 – 2016-05-25 (×13): 20 mg via INTRAVENOUS
  Filled 2016-05-11 (×13): qty 50

## 2016-05-11 MED ORDER — CHLORHEXIDINE GLUCONATE 0.12% ORAL RINSE (MEDLINE KIT)
15.0000 mL | Freq: Two times a day (BID) | OROMUCOSAL | Status: DC
  Administered 2016-05-11 – 2016-05-25 (×22): 15 mL via OROMUCOSAL

## 2016-05-11 MED ORDER — ACETAMINOPHEN 650 MG RE SUPP: 650.0000 mg | RECTAL | Status: DC | PRN

## 2016-05-11 MED ORDER — MIDAZOLAM HCL 2 MG/2ML IJ SOLN
1.0000 mg | INTRAMUSCULAR | Status: DC | PRN
  Administered 2016-05-11 – 2016-05-19 (×43): 1 mg via INTRAVENOUS
  Filled 2016-05-11 (×45): qty 2

## 2016-05-11 MED ORDER — CALCIUM CHLORIDE 10 % IV SOLN
INTRAVENOUS | Status: AC
  Administered 2016-05-11: 100 mg via INTRAVENOUS
  Filled 2016-05-11: qty 10

## 2016-05-11 MED ORDER — CHLORHEXIDINE GLUCONATE 0.12% ORAL RINSE (MEDLINE KIT)
15.0000 mL | Freq: Two times a day (BID) | OROMUCOSAL | Status: DC
  Administered 2016-05-11 – 2016-05-16 (×7): 15 mL via OROMUCOSAL

## 2016-05-11 MED ORDER — MIDAZOLAM HCL 2 MG/2ML IJ SOLN
1.0000 mg | INTRAMUSCULAR | Status: AC | PRN
  Administered 2016-05-11 (×3): 1 mg via INTRAVENOUS
  Filled 2016-05-11 (×4): qty 2

## 2016-05-11 MED FILL — Medication: Qty: 1 | Status: AC

## 2016-05-11 NOTE — Progress Notes (Addendum)
Subjective: Interval History: Patient intubated.  Objective: Vital signs in last 24 hours: Temp:  [97.9 F (36.6 C)-98.4 F (36.9 C)] 97.9 F (36.6 C) (11/01 0722) Pulse Rate:  [63-91] 91 (11/01 0826) Resp:  [12-22] 15 (11/01 0722) BP: (137-201)/(64-115) 181/83 (10/31 1716) SpO2:  [95 %-100 %] 100 % (11/01 0826) FiO2 (%):  [100 %] 100 % (11/01 0826) Weight:  [59.9 kg (132 lb 0.9 oz)] 59.9 kg (132 lb 0.9 oz) (11/01 0500) Weight change: -1.6 kg (-3 lb 8.4 oz)  Intake/Output from previous day: 10/31 0701 - 11/01 0700 In: 757.5 [P.O.:720; I.V.:37.5] Out: -  Intake/Output this shift: Total I/O In: 9 [I.V.:9] Out: -   Patient intubated and sedated Chest: Inspiratory crackles Heart: RRR  Extremties: No edema  Lab Results:  Recent Labs  05/09/16 1430 05/11/16 0817  WBC 6.2 8.8  HGB 9.0* 9.7*  HCT 29.4* 31.8*  PLT 254 263   BMET:   Recent Labs  05/11/16 0631 05/11/16 0817  NA 131* 133*  K 6.4* 6.3*  CL 92* 92*  CO2 26 17*  GLUCOSE 89 235*  BUN 47* 50*  CREATININE 8.58* 9.09*  CALCIUM 7.9* 10.5*   No results for input(s): PTH in the last 72 hours. Iron Studies: No results for input(s): IRON, TIBC, TRANSFERRIN, FERRITIN in the last 72 hours.  Studies/Results: Dg Chest Port 1 View  Result Date: 05/11/2016 CLINICAL DATA:  Intubated, post resuscitation EXAM: PORTABLE CHEST 1 VIEW COMPARISON:  05/08/2016 FINDINGS: Borderline cardiomegaly. Status post CABG. Endotracheal tube at the level of carina. Endotracheal tube should be retracted about 1 cm. No pneumothorax. Mild basilar atelectasis. No pulmonary edema. IMPRESSION: Endotracheal tube at the level of carina. Endotracheal tube should be retracted about 1 cm. No pneumothorax. No pulmonary edema. Electronically Signed   By: Lahoma Crocker M.D.   On: 05/11/2016 08:32    I have reviewed the patient's current medications.  Assessment/Plan: Problem #1 patient with episode of bradycardia/unresponsive/status post CODE BLUE  and intubation. Presently patient is sedated.  Problem #2 hypertension: Her blood pressure at this moment seems to be still high. Problem #3 end-stage renal disease: She is status post hemodialysis Monday. Patient is due for dialysis today. Her potassium 6.4. Problem #4 anxiety disorder Problem #5 anemia: Her hemoglobin is below our target goal. Presently she is on Epogen. Problem #6 fluid management: Patient intubated at present time Problem #7 metabolic bone disease: Her calcium is normal at the phosphorus is high. Her binder was discontinued because of hypophosphatemia. Plan: 1]Will dialyze patient today for 4 hours and used to K/2.5 calcium bath 2] will check renal panel in the morning 3] will remove 2 L if her systolic blood pressure remains stable 4] will continue with Epogen with dialysis.   LOS: 2 days   Lismary Kiehn S 05/11/2016,9:15 AM

## 2016-05-11 NOTE — Consult Note (Signed)
Consult requested by:Dr. Memon Consult requested for acute respiratory failure requiring ventilator support:  HPI: This is a 75 year old who had CODE BLUE this morning. I was in the intensive care unit when she dropped her heart rate became unresponsive. CODE BLUE was called. She was resuscitated with multiple vials of epinephrine and atropine calcium sodium bicarbonate had CPR and was intubated and placed on mechanical ventilation. She has been on an epinephrine drip. She was found to have elevated potassium. She has end-stage chronic renal failure. She came to the hospital with hypertensive emergency. Consultation is requested for ventilator management and management of acute respiratory failure.  Past Medical History:  Diagnosis Date  . Anxiety   . Arthritis   . AVM (arteriovenous malformation) of colon   . CAD (coronary artery disease)    a. 12/2011 NSTEMI/Cath/PCI LCX (2.25x14 Resolute DES) & D1 (2.25x22 Resolute DES);  b. 01/2012 Cath/PCI: LM 30, LAD 30p, 40-32m D1 stent ok, 99 in sm branch of diag, LCX patent stent, OM1 20, RCA 95 ost (4.0x12 Promus DES), EF 55%;  c. 04/2012 Lexi Cardiolite  EF 48%, small area of scar @ base/mid inflat wall with mild peri-infarct ischemia.; CABG 12/4  . Carotid artery disease ([facility]    a. 671-69%LICA, 96/7893  . Chronic bronchitis ([facility]   . Chronic diastolic CHF (congestive heart failure) ([facility]    a. 02/2012 Echo EF 60-65%, nl wall motion, Gr 1 DD, mod MR  . Colon cancer ([facility] 1992  . Esophageal stricture   . [place]ialysis ([facility]    ESRD due to HTN, started dialysis 2011 [facility]  Access is LUA AVF as of Sept 2014.   .Marland KitchenGERD (gastroesophageal reflux disease)   . High cholesterol 12/2011  . History of blood transfusion 07/2011; 12/2011; 01/2012 X 2; 04/2012  . History of gout   . History of lower GI bleeding   . Hypertension   . Iron deficiency anemia   . Mitral regurgitation    a. Moderate by  echo, 02/2012  . Myocardial infarction   . Ovarian cancer ([facility] 1992  . Pneumonia ~ 2009  . PUD (peptic ulcer disease)   . TIA (transient ischemic attack)      Family History  Problem Relation Age of Onset  . Heart disease Mother     Heart Disease before age 76 . Hyperlipidemia Mother   . Hypertension Mother   . Diabetes Mother   . Heart attack Mother   . Heart disease Father     Heart Disease before age 76 . Hyperlipidemia Father   . Hypertension Father   . Diabetes Father   . Diabetes Sister   . Hypertension Sister   . Diabetes Brother   . Hyperlipidemia Brother   . Heart attack Brother   . Hypertension Sister   . Heart attack Brother   . Other      noncontributory for early CAD  . Colon cancer Neg Hx   . Esophageal cancer Neg Hx   . Liver disease Neg Hx   . Kidney disease Neg Hx   . Colon polyps Neg Hx      Social History   Social History  . Marital status: Married    Spouse name: N/A  . Number of children: N/A  . Years of education: N/A   Social History Main Topics  . Smoking status: Never Smoker  . Smokeless tobacco: Never Used  .  Alcohol use No  . Drug use: No  . Sexual activity: Yes    Birth control/ protection: Surgical   Other Topics Concern  . None   Social History Narrative   Lives in [place], [place] with husband.  Dialysis pt - [facility].     ROS: Not obtainable    Objective: Vital signs in last 24 hours: Temp:  [97.9 F (36.6 C)-98.4 F (36.9 C)] 97.9 F (36.6 C) (11/01 0722) Pulse Rate:  [63-91] 91 (11/01 0826) Resp:  [12-22] 15 (11/01 0722) BP: (137-201)/(64-115) 181/83 (10/31 1716) SpO2:  [95 %-100 %] 100 % (11/01 0826) FiO2 (%):  [100 %] 100 % (11/01 0826) Weight:  [59.9 kg (132 lb 0.9 oz)] 59.9 kg (132 lb 0.9 oz) (11/01 0500) Weight change: -1.6 kg (-3 lb 8.4 oz) Last BM Date: 05/08/16  Intake/Output from previous day: 10/31 0701 - 11/01 0700 In: 757.5 [P.O.:720; I.V.:37.5] Out: -   PHYSICAL EXAM Intubated. She has not  received any sedatives and she's not very agitated at this point. She is on mechanical ventilation. Her pupils do react some. Mucous membranes are dry. She has some secretions in the back of her throat. Her neck is supple. Chest shows rhonchi bilaterally. Her heart is regular without gallop. Her abdomen is soft no masses are felt. Extremities showed no edema. Central nervous system exam really not able to be assessed  Lab Results: Basic Metabolic Panel:  Recent Labs  05/09/16 1430 05/11/16 0631  NA 135  135 131*  K 3.9  3.9 6.4*  CL 99*  99* 92*  CO2 _0 GLUCOSE 112*  111* 89  BUN 43*  42* 47*  CREATININE 7.39*  7.56* 8.58*  CALCIUM 7.9*  7.9* 7.9*  PHOS 1.3* 7.0*   Liver Function Tests:  Recent Labs  05/08/16 2326 05/09/16 1430 05/11/16 0631  AST 18  --   --   ALT 13*  --   --   ALKPHOS 79  --   --   BILITOT 0.7  --   --   PROT 6.9  --   --   ALBUMIN 3.7 3.2* 3.1*   No results for input(s): LIPASE, AMYLASE in the last 72 hours. No results for input(s): AMMONIA in the last 72 hours. CBC:  Recent Labs  05/08/16 2326 05/09/16 1430 05/11/16 0817  WBC 7.3 6.2 8.8  NEUTROABS 5.1  --   --   HGB 10.5* 9.0* 9.7*  HCT 34.0* 29.4* 31.8*  MCV 101.8* 101.7* 102.9*  PLT 307 254 263   Cardiac Enzymes:  Recent Labs  05/08/16 2326 05/09/16 0225 05/09/16 1430  TROPONINI 0.04* 0.05* 0.05*   BNP: No results for input(s): PROBNP in the last 72 hours. D-Dimer: No results for input(s): DDIMER in the last 72 hours. CBG:  Recent Labs  05/09/16 2029 05/11/16 0735  GLUCAP 160* 77   Hemoglobin A1C: No results for input(s): HGBA1C in the last 72 hours. Fasting Lipid Panel: No results for input(s): CHOL, HDL, LDLCALC, TRIG, CHOLHDL, LDLDIRECT in the last 72 hours. Thyroid Function Tests:  Recent Labs  05/09/16 0225  TSH 10.500*  FREET4 0.72   Anemia Panel: No results for input(s): VITAMINB12, FOLATE, FERRITIN, TIBC, IRON, RETICCTPCT in the last 72  hours. Coagulation:  Recent Labs  05/08/16 2351  LABPROT 12.3  INR 0.92   Urine Drug Screen: Drugs of Abuse  No results found for: LABOPIA, COCAINSCRNUR, LABBENZ, AMPHETMU, THCU, LABBARB  Alcohol Level: No results for input(s): [facility]  in the last 72 hours. Urinalysis: No results for input(s): COLORURINE, LABSPEC, PHURINE, GLUCOSEU, HGBUR, BILIRUBINUR, KETONESUR, PROTEINUR, UROBILINOGEN, NITRITE, LEUKOCYTESUR in the last 72 hours.  Invalid input(s): APPERANCEUR Misc. Labs:   ABGS: No results for input(s): PHART, PO2ART, TCO2, HCO3 in the last 72 hours.  Invalid input(s): PCO2   MICROBIOLOGY: Recent Results (from the past 240 hour(s))  MRSA PCR Screening     Status: None   Collection Time: 05/09/16  2:38 AM  Result Value Ref Range Status   MRSA by PCR NEGATIVE NEGATIVE Final    Comment:        The GeneXpert MRSA Assay (FDA approved for NASAL specimens only), is one component of a comprehensive MRSA colonization surveillance program. It is not intended to diagnose MRSA infection nor to guide or monitor treatment for MRSA infections.     Studies/Results: Dg Chest Port 1 View  Result Date: 05/11/2016 CLINICAL DATA:  Intubated, post resuscitation EXAM: PORTABLE CHEST 1 VIEW COMPARISON:  05/08/2016 FINDINGS: Borderline cardiomegaly. Status post CABG. Endotracheal tube at the level of carina. Endotracheal tube should be retracted about 1 cm. No pneumothorax. Mild basilar atelectasis. No pulmonary edema. IMPRESSION: Endotracheal tube at the level of carina. Endotracheal tube should be retracted about 1 cm. No pneumothorax. No pulmonary edema. Electronically Signed   By: Lahoma Crocker M.D.   On: 05/11/2016 08:32    Medications:  Prior to Admission:  Prescriptions Prior to Admission  Medication Sig Dispense Refill Last Dose  . ALPRAZolam (XANAX) 0.25 MG tablet Take 1-2 tablets by mouth at bedtime as needed for sleep.    05/08/2016 at Unknown time  . amiodarone (PACERONE) 200  MG tablet Take 1 tablet (200 mg total) by mouth daily. 30 tablet 3 05/08/2016 at Unknown time  . aspirin EC 81 MG tablet Take 81 mg by mouth daily.    05/08/2016 at Unknown time  . cloNIDine (CATAPRES) 0.1 MG tablet Take 1 tablet (0.1 mg total) by mouth 2 (two) times daily. 60 tablet 11 05/08/2016 at Unknown time  . epoetin alfa (EPOGEN,PROCRIT) 11914 UNIT/ML injection Inject 1 mL (10,000 Units total) into the vein every Monday, Wednesday, and Friday with hemodialysis. 1 mL 10 05/06/2016 at Unknown time  . fluticasone (FLONASE) 50 MCG/ACT nasal spray Place 2 sprays into the nose daily as needed for allergies.    unknown  . labetalol (NORMODYNE) 200 MG tablet Take 1 tablet (200 mg total) by mouth 2 (two) times daily. 90 tablet 1 05/08/2016 at Unknown time  . lanthanum (FOSRENOL) 1000 MG chewable tablet Chew 1 tablet (1,000 mg total) by mouth 3 (three) times daily with meals. 90 tablet 1 05/08/2016 at Unknown time  . multivitamin (RENA-VIT) TABS tablet Take 1 tablet by mouth daily.   05/08/2016 at Unknown time  . nitroGLYCERIN (NITROSTAT) 0.4 MG SL tablet Place 1 tablet (0.4 mg total) under the tongue every 5 (five) minutes as needed for chest pain. 25 tablet 3 05/08/2016 at Unknown time  . omeprazole (PRILOSEC) 20 MG capsule TAKE ONE CAPSULE BY MOUTH ONCE DAILY 30 capsule 5 05/08/2016 at Unknown time  . PROAIR HFA 108 (90 BASE) MCG/ACT inhaler Inhale 1 puff into the lungs every 6 (six) hours as needed for wheezing or shortness of breath.    unknown  . SENSIPAR 30 MG tablet Take 30 mg by mouth daily with supper.    05/08/2016 at Unknown time  . simvastatin (ZOCOR) 20 MG tablet Take 1 tablet (20 mg total) by mouth at bedtime. [place]  tablet 3 05/07/2016 at Unknown time   Scheduled: . amiodarone  200 mg Oral Daily  . aspirin EC  81 mg Oral Daily  . chlorhexidine gluconate (MEDLINE KIT)  15 mL Mouth Rinse BID  . cloNIDine  0.1 mg Oral BID  . famotidine (PEPCID) IV  20 mg Intravenous Q12H  . heparin  5,000  Units Subcutaneous Q8H  . labetalol  200 mg Oral BID  . mouth rinse  15 mL Mouth Rinse QID  . multivitamin  1 tablet Oral Daily  . NIFEdipine  30 mg Oral Daily  . pantoprazole  40 mg Oral Daily  . simvastatin  20 mg Oral QHS   Continuous: . epinephrine 15 mcg/min (05/11/16 0827)   YZJ:QDUKRC chloride, sodium chloride, acetaminophen, ALPRAZolam, fentaNYL (SUBLIMAZE) injection, fentaNYL (SUBLIMAZE) injection, fluticasone, ipratropium-albuterol, labetalol, lidocaine (PF), lidocaine-prilocaine, midazolam, midazolam, ondansetron (ZOFRAN) IV, pentafluoroprop-tetrafluoroeth  Assesment: She was admitted with hypertensive crisis and then suffered cardiopulmonary arrest requiring CPR intubation and mechanical ventilation. I have written ventilator and sedation orders. Principal Problem:   Hypertensive crisis Active Problems:   Coronary atherosclerosis of native coronary artery   ESRD on hemodialysis (HCC)   Anemia of chronic kidney failure   Chronic diastolic CHF (congestive heart failure) (HCC)   Chest pain   Hypertensive crisis without congestive heart failure    Plan: Continue treatments. Continue ventilator support. Sedation as needed.    LOS: 2 days   Amandamarie Feggins L 05/11/2016, 9:01 AM

## 2016-05-11 NOTE — Plan of Care (Signed)
RN paged NP with troponin level of 1.53 which is a slight increase over earlier today. Chart reviewed. Cards is following and stated elevation is likely due to demand ischemia. Also contributing is pt is post-CPR x 25 mins today.  KJKG, NP [facility]

## 2016-05-11 NOTE — Progress Notes (Signed)
Patient Name: Shawna [name] Date of Encounter: 05/11/2016  Primary Cardiologist: Dr. Marca Ancona Problem List     Principal Problem:   Hypertensive crisis Active Problems:   Coronary atherosclerosis of native coronary artery   ESRD on hemodialysis (HCC)   Anemia of chronic kidney failure   Chronic diastolic CHF (congestive heart failure) (HCC)   Chest pain   Hypertensive crisis without congestive heart failure     Subjective  Patient on ventilator. Bradycardia this am with potassium of 6.0. Coded, CPR, on Epi drip  Inpatient Medications    Scheduled Meds: . amiodarone  200 mg Oral Daily  . aspirin EC  81 mg Oral Daily  . calcium chloride      . cloNIDine  0.1 mg Oral BID  . heparin  5,000 Units Subcutaneous Q8H  . labetalol  200 mg Oral BID  . multivitamin  1 tablet Oral Daily  . NIFEdipine  30 mg Oral Daily  . pantoprazole  40 mg Oral Daily  . simvastatin  20 mg Oral QHS   Continuous Infusions: . epinephrine 12 mcg/min (05/11/16 0815)   PRN Meds: sodium chloride, sodium chloride, acetaminophen, ALPRAZolam, fluticasone, ipratropium-albuterol, labetalol, lidocaine (PF), lidocaine-prilocaine, ondansetron (ZOFRAN) IV, pentafluoroprop-tetrafluoroeth   Vital Signs    Vitals:   05/11/16 0400 05/11/16 0500 05/11/16 0508 05/11/16 0722  BP:      Pulse:   65 63  Resp:   12 15  Temp: 98 F (36.7 C)   97.9 F (36.6 C)  TempSrc: Oral   Oral  SpO2:   100% 100%  Weight:  132 lb 0.9 oz (59.9 kg)    Height:        Intake/Output Summary (Last 24 hours) at 05/11/16 0816 Last data filed at 05/10/16 1736  Gross per 24 hour  Intake            757.5 ml  Output                0 ml  Net            757.5 ml   Filed Weights   05/09/16 1325 05/10/16 0500 05/11/16 0500  Weight: 135 lb 9.3 oz (61.5 kg) 132 lb 0.9 oz (59.9 kg) 132 lb 0.9 oz (59.9 kg)    Physical Exam   GEN: Unresponsive on vent Neck: Supple, no JVD, carotid bruits, or masses. Cardiac: RRR,  distant HS, no murmurs, rubs, or gallops. No clubbing, cyanosis, edema.  Radials/DP/PT 2+ and equal bilaterally.  Respiratory:  On vent, clear to auscultation bilaterally. GI: Soft, nontender, nondistended, BS + x 4. MS: no deformity or atrophy. Skin: warm and dry, no rash. Neuro:  unresponive   Labs    CBC  Recent Labs  05/08/16 2326 05/09/16 1430  WBC 7.3 6.2  NEUTROABS 5.1  --   HGB 10.5* 9.0*  HCT 34.0* 29.4*  MCV 101.8* 101.7*  PLT 307 956   Basic Metabolic Panel  Recent Labs  05/09/16 1430 05/11/16 0631  NA 135  135 131*  K 3.9  3.9 6.4*  CL 99*  99* 92*  CO2 27  27 26   GLUCOSE 112*  111* 89  BUN 43*  42* 47*  CREATININE 7.39*  7.56* 8.58*  CALCIUM 7.9*  7.9* 7.9*  PHOS 1.3* 7.0*   Liver Function Tests  Recent Labs  05/08/16 2326 05/09/16 1430 05/11/16 0631  AST 18  --   --   ALT 13*  --   --  ALKPHOS 79  --   --   BILITOT 0.7  --   --   PROT 6.9  --   --   ALBUMIN 3.7 3.2* 3.1*   No results for input(s): LIPASE, AMYLASE in the last 72 hours. Cardiac Enzymes  Recent Labs  05/08/16 2326 05/09/16 0225 05/09/16 1430  TROPONINI 0.04* 0.05* 0.05*   BNP Invalid input(s): POCBNP D-Dimer No results for input(s): DDIMER in the last 72 hours. Hemoglobin A1C No results for input(s): HGBA1C in the last 72 hours. Fasting Lipid Panel No results for input(s): CHOL, HDL, LDLCALC, TRIG, CHOLHDL, LDLDIRECT in the last 72 hours. Thyroid Function Tests  Recent Labs  05/09/16 0225  TSH 10.500*    Telemetry    Bradyarrhythmias, NSVT - Personally Reviewed  ECG    ?second degree AV block, bifasicular block - Personally Reviewed  Radiology    No results found.  Cardiac Studies     Patient Profile     75 year old woman PMH coronary artery disease, end-stage renal disease, presented with acute onset of chest pain unrelieved by aspirin and nitroglycerin. In the emergency department BP 230/130, noted to have new ST depression. Treated  with nitroglycerin infusion and IV labetalol. Admitted for hypertensive crisis with chest pain.Bradycardia and unresponsive 11/1, CPR, vent on EPI drip     Assessment & Plan    1. Hypertensive emergency - admitted with severe hypertension SBP 230, multiple admits in the past with HTN urgency/emergency - home regimen clonidine 0.1mg  bid, labetlol 200mg  bid - from discussions with nephrology the patient had done fairly well when on procardia in the past, though she had some LE edema at times. She has also had issues with severe symptomatic hypotension on hemodialysis - agree with renal by starting procardia  - she is off both IV labetolol and NG this AM, SBP in 123/78 after code.      2. Elevated troponin -mild elevation flat elevation in setting of severe HTN. Chest pain resolved with improved blood pressure. She does have a history of CAD. At this time favor likely demand ischemia in setting of severe HTN as opposed to ACS.  - continue bp control and follow symptoms       3. ESRD - pt had HD yesterday, plans for HD today.  4. Sudden bradycardia, unresponsive, CPR, most likely secondary to hyperkalemia. K 6.4 on Epi drip. For dialysis       Signed, Ermalinda Barrios, PA-C  05/11/2016, 8:16 AM   Patient seen and discussed with PA Bonnell Public, I agree with her documentation above. Episode of bradycardia and PEA arrest this AM in setting of severe hyperkalemia. From primary team note received 25 min of CPR. She had originally been admitted for hypertensive emergency, weaned off NG and labetlol drips and was maintained on oral regimen. Bradycardia into PEA not typical of a primary cardiac event as etiology of her arrest, I suspect it was truly related to her hyperkalemia.   Telemetry tracings from event reviewed. Progressive sinus bradycardia to 30s followed by junctional escape rhythm. S/p resuscitation she is back in NSR with normal rates tolerating dialysis. Of note she has been on clonidine and  labetlol for a very long time, I do not think this was related to her heart rates. F/u repeat 12 lead EKG. At this time suspect related to electrolyte abnormalites and not primary cardiac event, follow status after HD today. Continue to hold antihypertensives and av nodal agents.    Zandra Abts MD

## 2016-05-11 NOTE — ED Provider Notes (Signed)
I was called to the ICU for a CODE BLUE. Dr. Luan Pulling was artery in the room and was followed shortly after by Dr. Candiss Norse and Dr. Jerilee Hoh. Was in a bradycardic rhythm. Dr. Luan Pulling was bagging the patient with respiratory assistance. Initial difficulty finding stylette for ET tube. Dr. Luan Pulling attempted intubation with direct laryngoscopy was unable to get good intubation. At this point I intubated. Using a glide scope I placed a 7.5 ET tube. Good breath sounds bilaterally as auscultated by Dr. Jerilee Hoh. Patient had been given calcium bicarbonate and insulin and glucose presumed hyperkalemia. Reportedly had potassium of 6.2 this morning. She is a dialysis patient and is due for dialysis today.  INTUBATION Performed by: Mackie Pai  Required items: required blood products, implants, devices, and special equipment available Patient identity confirmed: provided demographic data and hospital-assigned identification number Time out: Immediately prior to procedure a "time out" was called to verify the correct patient, procedure, equipment, support staff and site/side marked as required.  Indications: CPR  Intubation method: Glidescope Laryngoscopy   Preoxygenation: BVM    Tube Size: 7.5 cuffed  Post-procedure assessment: chest rise and ETCO2 monitor Breath sounds: equal and absent over the epigastrium Tube secured with: ETT holder  Patient tolerated the procedure well with no immediate complications.      Shawna Belling, MD 05/11/16 [phone]

## 2016-05-11 NOTE — Progress Notes (Signed)
PROGRESS NOTE    Shawna [name]  YSA:[id number] DOB: 13-Jul-1940 DOA: 05/08/2016 PCP: [name], MD    Brief Narrative: 57-yof with a history of CAD and ESRD, presented with acute onset of chest pain unrelieved by aspirin and nitroglycerin. In the emergency department BP 230/130 and she was noted to have new ST depression. She was started on nitroglycerin infusion and IV labetalol and admitted for hypertensive crisis with chest pain. Her chest pain had resolved but she remained hypertensive. On 11/1 patient suddenly became bradycardic and unresponsive. She underwent CPR, chest compressions, received epinephrine and had ROSC. She is currently on ventilatory support.   Assessment & Plan:   Principal Problem:   Hypertensive crisis Active Problems:   Coronary atherosclerosis of native coronary artery   ESRD on hemodialysis (HCC)   Anemia of chronic kidney failure   Chronic diastolic CHF (congestive heart failure) (HCC)   Chest pain   Hypertensive crisis without congestive heart failure  1. Bradycardia with cardiac arrest. On 11/1, patient's heart rate became bradycardic into PEA. She underwent CPR, received epinephrine, bicarbonate and calcium chloride. She had ROSC after approximately 25 mins of CPR. Lab work from this morning indicated an elevated potassium of 6.4, which may have been cause. She has been started on a epinephrine infusion which will be weaned off. Stat labs have been sent. CXR does not show any acute process. Cardiology following. 2. Hypertensive crisis with chest pain. Patient was admitted with a blood pressure of 230/130 and chest pain. She was started on nitroglycerin and IV labetalol which have been discontinued. Her chest pain had resolved. Continue to monitor.  3. Elevated troponin. Patient has a history of CAD status post CABG and stenting. Patient had no evidence of ACS on admission. Cardiology following and felt that this may be demand ischemia. Repeat troponin after  #1 is pending.  4. ESRD on HD MWF. Creatinine was 11.09 on admission, Nephrology consulted. She is due for dialysis today.  5. Chronic diastolic congestive heart failure. Patient has an echocardiogram on 09/2015 with an EF of 32-35%, grade 2 diastolic dysfunction.  6. PSVT, maintained on amiodarone. EKG remains in sinus rhythm.  7. Myoclonus. Suspect secondary to CKD/ESRD on HD. No history or objective findings to suggest acute CNS process. Reviewed meds, no likely offenders. Monitor for now. 8. Hyperkalemia. Patient has a potassium of 6.4. She received calcium chloride and sodium bicarbonate today. Nephrology consulted for dialysis 9. Elevated TSH. Recheck as an outpatient. 10. Anemia of chronic kidney disease. Hemoglobin is 9.0. There are no signs of active bleeding. Will continue to monitor.   DVT prophylaxis: Heparin injection  Code Status: FULL  Family Communication: discussed with husband at bedside  Disposition Plan: Discharge home once improved.     Consultants:   Nephrology   Cardiology   Pulmonology  Procedures:   None   Antimicrobials:   None    Subjective: Unresponsive on ventilator  Objective: Vitals:   05/11/16 0000 05/11/16 0400 05/11/16 0500 05/11/16 0508  BP:      Pulse:    65  Resp:    12  Temp: 98.4 F (36.9 C) 98 F (36.7 C)    TempSrc: Oral Oral    SpO2:    100%  Weight:   59.9 kg (132 lb 0.9 oz)   Height:        Intake/Output Summary (Last 24 hours) at 05/11/16 0656 Last data filed at 05/10/16 1736  Gross per 24 hour  Intake  757.5 ml  Output                0 ml  Net            757.5 ml   Filed Weights   05/09/16 1325 05/10/16 0500 05/11/16 0500  Weight: 61.5 kg (135 lb 9.3 oz) 59.9 kg (132 lb 0.9 oz) 59.9 kg (132 lb 0.9 oz)    Examination:  General exam: unresponsive Respiratory system: Clear to auscultation. No wheezes Cardiovascular system: S1 & S2 heard, RRR. No JVD, murmurs, rubs, gallops or clicks. No pedal  edema. Gastrointestinal system: Abdomen is nondistended, soft and nontender. No organomegaly or masses felt. Normal bowel sounds heard. Central nervous system: unresponsive Skin: No rashes, lesions or ulcers Psychiatry: unable to assess    Data Reviewed: I have personally reviewed following labs and imaging studies  CBC:  Recent Labs Lab 05/08/16 2326 05/09/16 1430  WBC 7.3 6.2  NEUTROABS 5.1  --   HGB 10.5* 9.0*  HCT 34.0* 29.4*  MCV 101.8* 101.7*  PLT 307 382   Basic Metabolic Panel:  Recent Labs Lab 05/08/16 2326 05/09/16 1430  NA 142 135  135  K 4.9 3.9  3.9  CL 100* 99*  99*  CO2 28 27  27   GLUCOSE 120* 112*  111*  BUN 65* 43*  42*  CREATININE 11.09* 7.39*  7.56*  CALCIUM 8.8* 7.9*  7.9*  PHOS  --  1.3*   GFR: Estimated Creatinine Clearance: 5.3 mL/min (by C-G formula based on SCr of 7.56 mg/dL (H)). Liver Function Tests:  Recent Labs Lab 05/08/16 2326 05/09/16 1430  AST 18  --   ALT 13*  --   ALKPHOS 79  --   BILITOT 0.7  --   PROT 6.9  --   ALBUMIN 3.7 3.2*   No results for input(s): LIPASE, AMYLASE in the last 168 hours. No results for input(s): AMMONIA in the last 168 hours. Coagulation Profile:  Recent Labs Lab 05/08/16 2351  INR 0.92   Cardiac Enzymes:  Recent Labs Lab 05/08/16 2326 05/09/16 0225 05/09/16 1430  TROPONINI 0.04* 0.05* 0.05*   BNP (last 3 results) No results for input(s): PROBNP in the last 8760 hours. HbA1C: No results for input(s): HGBA1C in the last 72 hours. CBG:  Recent Labs Lab 05/09/16 2029  GLUCAP 160*   Lipid Profile: No results for input(s): CHOL, HDL, LDLCALC, TRIG, CHOLHDL, LDLDIRECT in the last 72 hours. Thyroid Function Tests:  Recent Labs  05/09/16 0225  TSH 10.500*  FREET4 0.72   Anemia Panel: No results for input(s): VITAMINB12, FOLATE, FERRITIN, TIBC, IRON, RETICCTPCT in the last 72 hours. Sepsis Labs: No results for input(s): PROCALCITON, LATICACIDVEN in the last 168  hours.  Recent Results (from the past 240 hour(s))  MRSA PCR Screening     Status: None   Collection Time: 05/09/16  2:38 AM  Result Value Ref Range Status   MRSA by PCR NEGATIVE NEGATIVE Final    Comment:        The GeneXpert MRSA Assay (FDA approved for NASAL specimens only), is one component of a comprehensive MRSA colonization surveillance program. It is not intended to diagnose MRSA infection nor to guide or monitor treatment for MRSA infections.          Radiology Studies: No results found.      Scheduled Meds: . amiodarone  200 mg Oral Daily  . aspirin EC  81 mg Oral Daily  . cloNIDine  0.1 mg  Oral BID  . heparin  5,000 Units Subcutaneous Q8H  . labetalol  200 mg Oral BID  . multivitamin  1 tablet Oral Daily  . NIFEdipine  30 mg Oral Daily  . pantoprazole  40 mg Oral Daily  . simvastatin  20 mg Oral QHS   Continuous Infusions:    LOS: 2 days    Time spent: Critical care: 40 minutes     Kathie Dike, MD [facility] If 7PM-7AM, please contact night-coverage [email or web address] Password [facility] 05/11/2016, 6:56 AM   .

## 2016-05-12 ENCOUNTER — Encounter (HOSPITAL_COMMUNITY): Payer: Self-pay | Admitting: Primary Care

## 2016-05-12 ENCOUNTER — Inpatient Hospital Stay (HOSPITAL_COMMUNITY): Payer: Medicare HMO

## 2016-05-12 DIAGNOSIS — I469 Cardiac arrest, cause unspecified: Secondary | ICD-10-CM

## 2016-05-12 DIAGNOSIS — Z7189 Other specified counseling: Secondary | ICD-10-CM

## 2016-05-12 DIAGNOSIS — J9621 Acute and chronic respiratory failure with hypoxia: Secondary | ICD-10-CM | POA: Insufficient documentation

## 2016-05-12 DIAGNOSIS — J9600 Acute respiratory failure, unspecified whether with hypoxia or hypercapnia: Secondary | ICD-10-CM

## 2016-05-12 DIAGNOSIS — Z515 Encounter for palliative care: Secondary | ICD-10-CM

## 2016-05-12 DIAGNOSIS — J9601 Acute respiratory failure with hypoxia: Secondary | ICD-10-CM

## 2016-05-12 LAB — COMPREHENSIVE METABOLIC PANEL
ALBUMIN: 3.4 g/dL — AB (ref 3.5–5.0)
ALT: 529 U/L — ABNORMAL HIGH (ref 14–54)
ANION GAP: 14 (ref 5–15)
AST: 421 U/L — AB (ref 15–41)
Alkaline Phosphatase: 92 U/L (ref 38–126)
BUN: 38 mg/dL — AB (ref 6–20)
CHLORIDE: 95 mmol/L — AB (ref 101–111)
CO2: 26 mmol/L (ref 22–32)
Calcium: 8.6 mg/dL — ABNORMAL LOW (ref 8.9–10.3)
Creatinine, Ser: 6.35 mg/dL — ABNORMAL HIGH (ref 0.44–1.00)
GFR calc Af Amer: 7 mL/min — ABNORMAL LOW (ref >60)
GFR calc non Af Amer: 6 mL/min — ABNORMAL LOW (ref >60)
GLUCOSE: 90 mg/dL (ref 65–99)
POTASSIUM: 5.6 mmol/L — AB (ref 3.5–5.1)
SODIUM: 135 mmol/L (ref 135–145)
Total Bilirubin: 1.1 mg/dL (ref 0.3–1.2)
Total Protein: 6.5 g/dL (ref 6.5–8.1)

## 2016-05-12 LAB — CBC
HEMATOCRIT: 34.9 % — AB (ref 36.0–46.0)
HEMATOCRIT: 33 % — AB (ref 36.0–46.0)
HEMOGLOBIN: 10.2 g/dL — AB (ref 12.0–15.0)
Hemoglobin: 10.8 g/dL — ABNORMAL LOW (ref 12.0–15.0)
MCH: 30.9 pg (ref 26.0–34.0)
MCH: 31.1 pg (ref 26.0–34.0)
MCHC: 30.9 g/dL (ref 30.0–36.0)
MCHC: 30.9 g/dL (ref 30.0–36.0)
MCV: 100 fL (ref 78.0–100.0)
MCV: 100.6 fL — ABNORMAL HIGH (ref 78.0–100.0)
PLATELETS: 260 10*3/uL (ref 150–400)
Platelets: 253 10*3/uL (ref 150–400)
RBC: 3.49 MIL/uL — ABNORMAL LOW (ref 3.87–5.11)
RBC: 3.28 MIL/uL — AB (ref 3.87–5.11)
RDW: 17.3 % — AB (ref 11.5–15.5)
RDW: 17.2 % — ABNORMAL HIGH (ref 11.5–15.5)
WBC: 10.1 10*3/uL (ref 4.0–10.5)
WBC: 18 10*3/uL — ABNORMAL HIGH (ref 4.0–10.5)

## 2016-05-12 LAB — BASIC METABOLIC PANEL
Anion gap: 16 — ABNORMAL HIGH (ref 5–15)
BUN: 49 mg/dL — AB (ref 6–20)
CHLORIDE: 95 mmol/L — AB (ref 101–111)
CO2: 28 mmol/L (ref 22–32)
CREATININE: 7.49 mg/dL — AB (ref 0.44–1.00)
Calcium: 11 mg/dL — ABNORMAL HIGH (ref 8.9–10.3)
GFR calc Af Amer: 5 mL/min — ABNORMAL LOW (ref >60)
GFR calc non Af Amer: 5 mL/min — ABNORMAL LOW (ref >60)
Glucose, Bld: 162 mg/dL — ABNORMAL HIGH (ref 65–99)
POTASSIUM: 5.3 mmol/L — AB (ref 3.5–5.1)
Sodium: 139 mmol/L (ref 135–145)

## 2016-05-12 LAB — TROPONIN I: Troponin I: 0.66 ng/mL (ref <0.03)

## 2016-05-12 IMAGING — CR DG CHEST 1V PORT
1 series · 1 of 1 positions shown · non-contrast
Comparison: [DATE].

CLINICAL DATA: Intubation.

EXAM:
PORTABLE CHEST 1 VIEW

[portable]
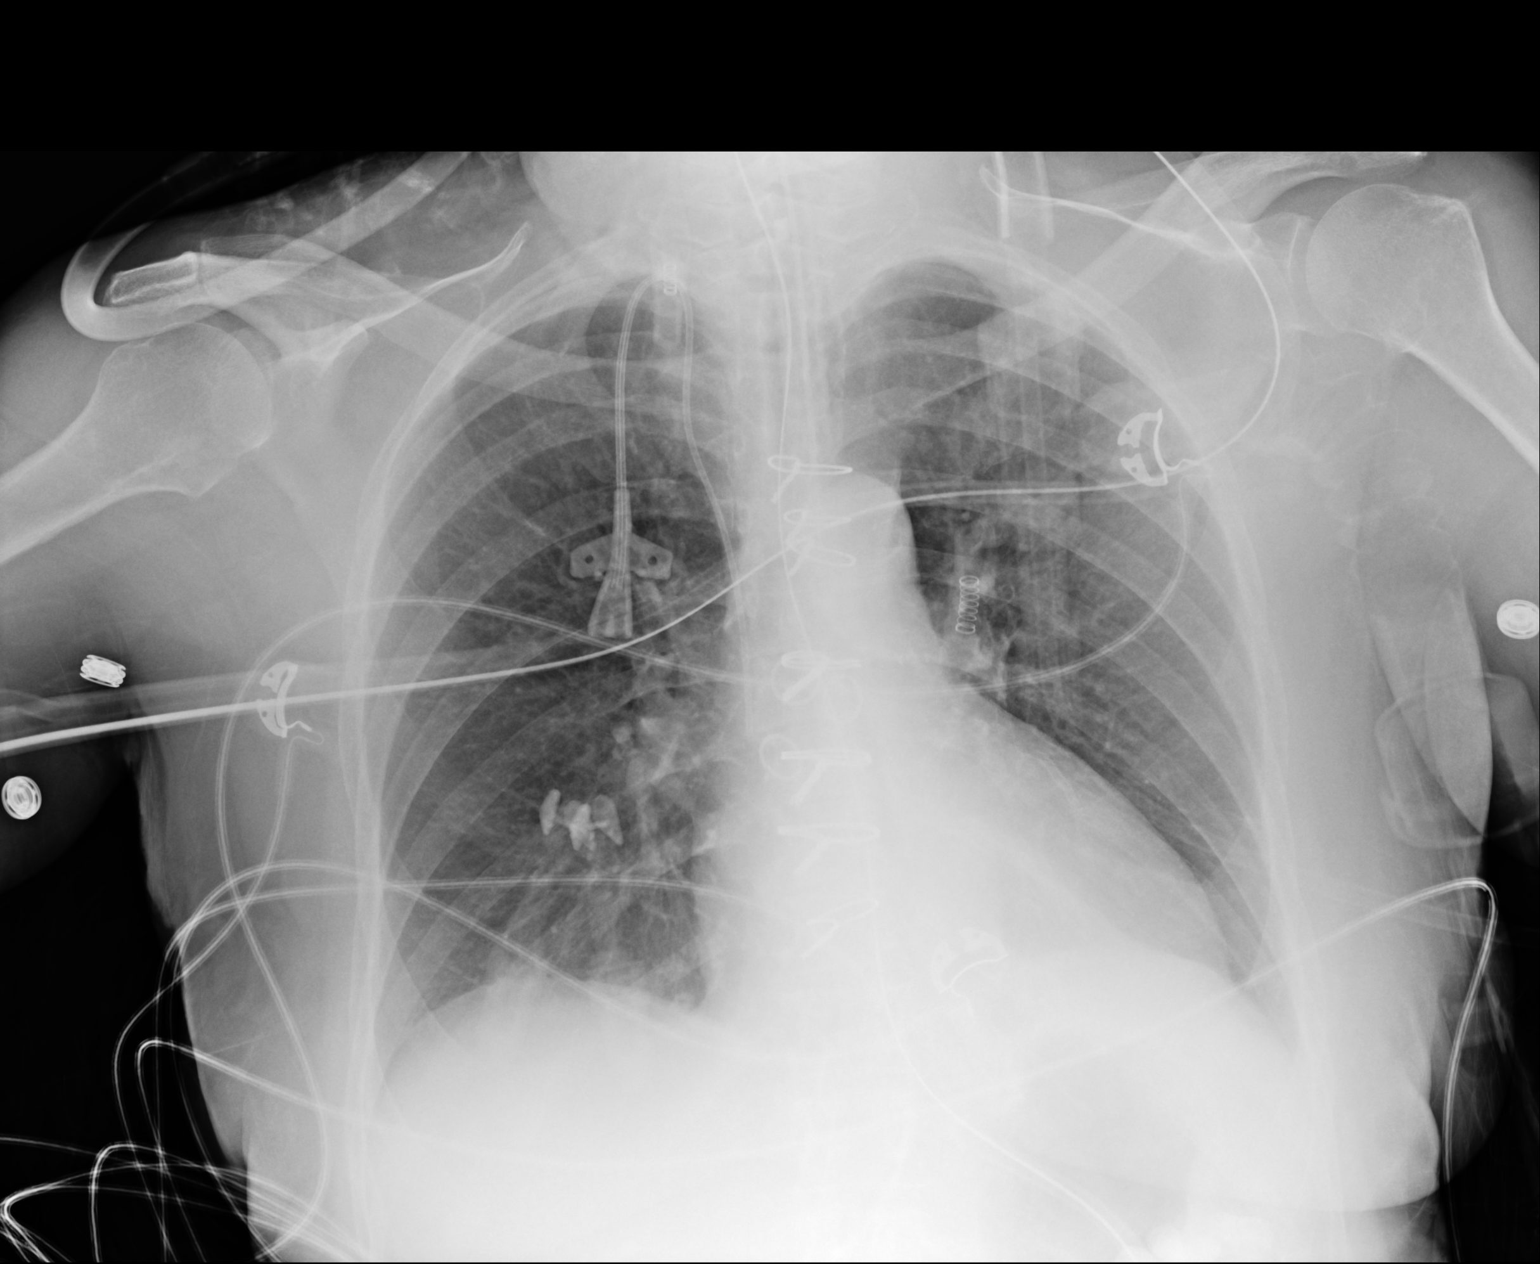

[1 of 1 positions shown; findings below may reference images not displayed]

FINDINGS: Endotracheal tube, NG tube, right IJ line in stable position. Prior
CABG. Normal pulmonary vascularity. Low lung volumes. Small
bilateral pleural effusions again cannot be excluded.
IMPRESSION: 1. Lines and tubes in stable position.

2. Prior CABG.  Heart size stable.

3. Low lung volumes with bibasilar atelectasis again noted. Small
bilateral pleural effusions again cannot be excluded. Chest is
unchanged from prior exam.

## 2016-05-12 MED ORDER — NITROGLYCERIN IN D5W 200-5 MCG/ML-% IV SOLN
0.0000 ug/min | INTRAVENOUS | Status: DC
  Administered 2016-05-12: 5 ug/min via INTRAVENOUS
  Administered 2016-05-13 (×2): 75 ug/min via INTRAVENOUS
  Administered 2016-05-14 (×2): 80 ug/min via INTRAVENOUS
  Filled 2016-05-12 (×5): qty 250

## 2016-05-12 MED ORDER — SODIUM CHLORIDE 0.9 % IV SOLN: 100.0000 mL | INTRAVENOUS | Status: DC | PRN

## 2016-05-12 MED ORDER — NITROGLYCERIN IN D5W 200-5 MCG/ML-% IV SOLN
0.0000 ug/min | INTRAVENOUS | Status: DC
  Administered 2016-05-12: 20 ug/min via INTRAVENOUS
  Filled 2016-05-12: qty 250

## 2016-05-12 NOTE — Progress Notes (Signed)
PROGRESS NOTE    Shawna [name]  FTD:[id number] DOB: [birth year] DOA: 05/08/2016 PCP: [name], MD    Brief Narrative: 16-yof with a history of CAD and ESRD, presented with acute onset of chest pain unrelieved by aspirin and nitroglycerin. In the emergency department BP 230/130 and she was noted to have new ST depression. She was started on nitroglycerin infusion and IV labetalol and admitted for hypertensive crisis with chest pain. Her chest pain had resolved but she remained hypertensive. On 11/1 patient suddenly became bradycardic and unresponsive. She underwent CPR, chest compressions, received epinephrine and had ROSC. She is currently on ventilatory support.   Assessment & Plan:   Principal Problem:   Hypertensive crisis Active Problems:   Coronary atherosclerosis of native coronary artery   ESRD on hemodialysis (HCC)   Anemia of chronic kidney failure   Chronic diastolic CHF (congestive heart failure) (HCC)   Chest pain   Hypertensive crisis without congestive heart failure  1. Bradycardia with PEA cardiac arrest. On 11/1, patient's heart rate became bradycardic into PEA. She underwent CPR, received epinephrine, bicarbonate and calcium chloride. She had ROSC after approximately 25 mins of CPR. Lab work had indicated an elevated potassium of 6.4, which may have been cause. She was briefly started on a epinephrine infusion which was quickly weaned off.  CXR does not show any acute process. Cardiology following. Heart rate is better now. 2. Acute respiratory failure. Ventilator dependent. Patient intubated yesterday while undergoing CPR. Pulmonology following. She is undergoing daily weaning trials. 3. Hypertensive crisis with chest pain. Patient was admitted with a blood pressure of 230/130 and chest pain. She was started on nitroglycerin and IV labetalol which were weaned off. Her chest pain had resolved. At this time, her blood pressure has started to rise again. Cardiology has  restarted nitro infusion.  4. Elevated troponin. Patient has a history of CAD status post CABG and stenting. Patient had no evidence of ACS on admission. Since that time, he troponin has started to rise. Cardiology following and felt that this may be demand ischemia/related to CPR. Repeat troponin in order to follow peak is pending.  5. ESRD on HD MWF. Nephrology consulted. She underwent dialysis yesterday, but is still hyperkalemic today. Plans are for another dialysis treatment today  6. Chronic diastolic congestive heart failure. Patient has an echocardiogram on 09/2015 with an EF of 06-23%, grade 2 diastolic dysfunction.  7. PSVT, maintained on amiodarone. EKG remains in sinus rhythm.  8. Myoclonus. Suspect secondary to CKD/ESRD on HD. No history or objective findings to suggest acute CNS process. Reviewed meds, no likely offenders. Monitor for now. 9. Hyperkalemia. Patient has a potassium of 5.6 today. Will receive another HD treatment today 10. Elevated TSH. Recheck as an outpatient. 11. Anemia of chronic kidney disease. Hemoglobin has improved to 10.8 over night. There are no signs of active bleeding. Will continue to monitor and recheck labs in the a.m.   DVT prophylaxis: Heparin injection  Code Status: FULL  Family Communication: no family present Disposition Plan: Discharge home once improved.     Consultants:   Nephrology   Cardiology   Pulmonology  Nutrition  Procedures:   None   Antimicrobials:   None    Subjective: Intubated on ventilator  Objective: Vitals:   05/12/16 0400 05/12/16 0430 05/12/16 0500 05/12/16 0530  BP: (!) 197/79 131/83 127/68 (!) 165/77  Pulse: 73 78 75 73  Resp: 14 14 14 14   Temp: 99 F (37.2 C)  TempSrc: Axillary     SpO2: 100% 100% 100% 100%  Weight:   60 kg (132 lb 4.4 oz)   Height:        Intake/Output Summary (Last 24 hours) at 05/12/16 0626 Last data filed at 05/11/16 1853  Gross per 24 hour  Intake            71.87 ml    Output             2100 ml  Net         -2028.13 ml   Filed Weights   05/11/16 0500 05/11/16 0900 05/12/16 0500  Weight: 59.9 kg (132 lb 0.9 oz) 59.9 kg (132 lb 0.9 oz) 60 kg (132 lb 4.4 oz)    Examination:  General exam: Appears calm and comfortable, intubated  Respiratory system: bilateral rhonchi. Respiratory effort normal. Cardiovascular system: S1 & S2 heard, RRR. No JVD, murmurs, rubs, gallops or clicks. No pedal edema. Gastrointestinal system: Abdomen is nondistended, soft and nontender. No organomegaly or masses felt. Normal bowel sounds heard. Central nervous system:No focal neurological deficits. Extremities: Symmetric 5 x 5 power. Skin: No rashes, lesions or ulcers Psychiatry: cannot assess    Data Reviewed: I have personally reviewed following labs and imaging studies  CBC:  Recent Labs Lab 05/08/16 2326 05/09/16 1430 05/11/16 0817 05/12/16 0359  WBC 7.3 6.2 8.8 10.1  NEUTROABS 5.1  --   --   --   HGB 10.5* 9.0* 9.7* 10.8*  HCT 34.0* 29.4* 31.8* 34.9*  MCV 101.8* 101.7* 102.9* 100.0  PLT 307 254 263 656   Basic Metabolic Panel:  Recent Labs Lab 05/09/16 1430 05/11/16 0631 05/11/16 0817 05/11/16 1410 05/12/16 0359  NA 135  135 131* 133* 137 135  K 3.9  3.9 6.4* 6.3* 4.0 5.6*  CL 99*  99* 92* 92* 96* 95*  CO2 27  27 26  17* 27 26  GLUCOSE 112*  111* 89 235* 100* 90  BUN 43*  42* 47* 50* 19 38*  CREATININE 7.39*  7.56* 8.58* 9.09* 4.08* 6.35*  CALCIUM 7.9*  7.9* 7.9* 10.5* 9.2 8.6*  MG  --   --  2.0  --   --   PHOS 1.3* 7.0*  --  4.2  --    GFR: Estimated Creatinine Clearance: 6.3 mL/min (by C-G formula based on SCr of 6.35 mg/dL (H)). Liver Function Tests:  Recent Labs Lab 05/08/16 2326 05/09/16 1430 05/11/16 0631 05/11/16 0817 05/11/16 1410 05/12/16 0359  AST 18  --   --  711*  --  421*  ALT 13*  --   --  629*  --  529*  ALKPHOS 79  --   --  76  --  92  BILITOT 0.7  --   --  0.6  --  1.1  PROT 6.9  --   --  5.4*  --  6.5   ALBUMIN 3.7 3.2* 3.1* 2.9* 3.6 3.4*   No results for input(s): LIPASE, AMYLASE in the last 168 hours. No results for input(s): AMMONIA in the last 168 hours. Coagulation Profile:  Recent Labs Lab 05/08/16 2351  INR 0.92   Cardiac Enzymes:  Recent Labs Lab 05/09/16 0225 05/09/16 1430 05/11/16 0817 05/11/16 1410 05/11/16 2010  TROPONINI 0.05* 0.05* 0.05* 1.27* 1.53*   BNP (last 3 results) No results for input(s): PROBNP in the last 8760 hours. HbA1C: No results for input(s): HGBA1C in the last 72 hours. CBG:  Recent Labs Lab 05/09/16 2029 05/11/16 0735  GLUCAP 160* 77   Lipid Profile: No results for input(s): CHOL, HDL, LDLCALC, TRIG, CHOLHDL, LDLDIRECT in the last 72 hours. Thyroid Function Tests: No results for input(s): TSH, T4TOTAL, FREET4, T3FREE, THYROIDAB in the last 72 hours. Anemia Panel: No results for input(s): VITAMINB12, FOLATE, FERRITIN, TIBC, IRON, RETICCTPCT in the last 72 hours. Sepsis Labs: No results for input(s): PROCALCITON, LATICACIDVEN in the last 168 hours.  Recent Results (from the past 240 hour(s))  MRSA PCR Screening     Status: None   Collection Time: 05/09/16  2:38 AM  Result Value Ref Range Status   MRSA by PCR NEGATIVE NEGATIVE Final    Comment:        The GeneXpert MRSA Assay (FDA approved for NASAL specimens only), is one component of a comprehensive MRSA colonization surveillance program. It is not intended to diagnose MRSA infection nor to guide or monitor treatment for MRSA infections.       Radiology Studies: Dg Chest Port 1 View  Result Date: 05/11/2016 CLINICAL DATA:  PICC line placement EXAM: PORTABLE CHEST 1 VIEW COMPARISON:  05/11/2016 FINDINGS: Internal jugular PICC line has been placed. The tip is in the SVC. No pneumothorax. Prior CABG. Heart is borderline in size. There is bibasilar atelectasis. Suspect small layering effusions. Endotracheal tube is 3.4 cm above the carina. NG tube enters the stomach.  IMPRESSION: Right internal jugular PICC line tip is in the SVC. No pneumothorax. Bibasilar atelectasis.  Suspect layering effusions. Electronically Signed   By: Rolm Baptise M.D.   On: 05/11/2016 15:16   Dg Chest Port 1 View  Result Date: 05/11/2016 CLINICAL DATA:  Intubated, post resuscitation EXAM: PORTABLE CHEST 1 VIEW COMPARISON:  05/08/2016 FINDINGS: Borderline cardiomegaly. Status post CABG. Endotracheal tube at the level of carina. Endotracheal tube should be retracted about 1 cm. No pneumothorax. Mild basilar atelectasis. No pulmonary edema. IMPRESSION: Endotracheal tube at the level of carina. Endotracheal tube should be retracted about 1 cm. No pneumothorax. No pulmonary edema. Electronically Signed   By: Lahoma Crocker M.D.   On: 05/11/2016 08:32     Scheduled Meds: . amiodarone  200 mg Oral Daily  . aspirin EC  81 mg Oral Daily  . chlorhexidine gluconate (MEDLINE KIT)  15 mL Mouth Rinse BID  . chlorhexidine gluconate (MEDLINE KIT)  15 mL Mouth Rinse BID  . famotidine (PEPCID) IV  20 mg Intravenous Q24H  . heparin  5,000 Units Subcutaneous Q8H  . mouth rinse  15 mL Mouth Rinse QID  . mouth rinse  15 mL Mouth Rinse QID  . multivitamin  1 tablet Oral Daily  . NIFEdipine  30 mg Oral Daily  . simvastatin  20 mg Oral QHS   Continuous Infusions:    LOS: 3 days    Time spent: 25 minutes  Kathie Dike, MD [facility] If 7PM-7AM, please contact night-coverage [email or web address] Password [facility] 05/12/2016, 6:26 AM

## 2016-05-12 NOTE — Progress Notes (Signed)
Was called to see patient for CODE BLUE.  Patient was noted to be hypertensive and was about to start on dialysis. She received one dose of IV labetalol and began to brady down into PEA. Chest compressions were initiated. She received a dose of epinephrine, bicarbonate and calcium chloride. She had ROSC. Blood pressure is currently hypertensive. Stat labs will be obtained. I had another conversation with the patient's husband. We discussed her current state and how this is her second time receiving CPR and that she is in critical condition. I recommended that DNR status at this point would be appropriate. Her husband is very adamant about continuing full code status and wants her to receive CPR if she has another cardiac arrest. Palliative care has been consulted. Await further input.  Time spent: critical care: 35 mins  Rollie Hynek

## 2016-05-12 NOTE — ED Provider Notes (Signed)
Department of Emergency Medicine   Code Blue CONSULT NOTE  Chief Complaint: Cardiac arrest/unresponsive   Level V Caveat: Unresponsive  History of present illness: I was contacted by the hospital for a CODE BLUE cardiac arrest upstairs and presented to the patient's bedside. The patient was just starting HD for pulmonary edema. Potassium noted to be slightly elevated on AM labs. The patient did have a cardiac arrest yesterday and was intubated during that event. PICC line in place and working well.   ROS: Unable to obtain, Level V caveat  Scheduled Meds: . amiodarone  200 mg Oral Daily  . aspirin EC  81 mg Oral Daily  . chlorhexidine gluconate (MEDLINE KIT)  15 mL Mouth Rinse BID  . chlorhexidine gluconate (MEDLINE KIT)  15 mL Mouth Rinse BID  . famotidine (PEPCID) IV  20 mg Intravenous Q24H  . heparin  5,000 Units Subcutaneous Q8H  . mouth rinse  15 mL Mouth Rinse QID  . mouth rinse  15 mL Mouth Rinse QID  . multivitamin  1 tablet Oral Daily  . NIFEdipine  30 mg Oral Daily   Continuous Infusions: . nitroGLYCERIN 60 mcg/min (05/12/16 1200)   PRN Meds:.sodium chloride, sodium chloride, acetaminophen, acetaminophen, ALPRAZolam, fentaNYL (SUBLIMAZE) injection, fentaNYL (SUBLIMAZE) injection, fluticasone, ipratropium-albuterol, labetalol, lidocaine (PF), lidocaine-prilocaine, midazolam, ondansetron (ZOFRAN) IV, pentafluoroprop-tetrafluoroeth Past Medical History:  Diagnosis Date  . Anxiety   . Arthritis   . AVM (arteriovenous malformation) of colon   . CAD (coronary artery disease)    a. 12/2011 NSTEMI/Cath/PCI LCX (2.25x14 Resolute DES) & D1 (2.25x22 Resolute DES);  b. 01/2012 Cath/PCI: LM 30, LAD 30p, 40-36m D1 stent ok, 99 in sm branch of diag, LCX patent stent, OM1 20, RCA 95 ost (4.0x12 Promus DES), EF 55%;  c. 04/2012 Lexi Cardiolite  EF 48%, small area of scar @ base/mid inflat wall with mild peri-infarct ischemia.; CABG 12/4  . Carotid artery disease ([place]    a. 693-81%LICA,  98/2993  . Chronic bronchitis ([facility]   . Chronic diastolic CHF (congestive heart failure) ([place]    a. 02/2012 Echo EF 60-65%, nl wall motion, Gr 1 DD, mod MR  . Colon cancer ([place] 1992  . Esophageal stricture   . [place]D on [place]ialysis ([place]    ESRD due to HTN, [facility] 2011 [facility]  Access is LUA AVF as of Sept 2014.   .Marland KitchenGERD (gastroesophageal reflux disease)   . High cholesterol 12/2011  . History of blood transfusion 07/2011; 12/2011; 01/2012 X 2; 04/2012  . History of gout   . History of lower GI bleeding   . Hypertension   . Iron deficiency anemia   . Mitral regurgitation    a. Moderate by echo, 02/2012  . Myocardial infarction   . Ovarian cancer ([place] 1992  . Pneumonia ~ 2009  . PUD (peptic ulcer disease)   . TIA (transient ischemic attack)    Past Surgical History:  Procedure Laterality Date  . ABDOMINAL HYSTERECTOMY  1992  . APPENDECTOMY  06/1990  . AV FISTULA PLACEMENT  07/2009   left upper arm  . COLON RESECTION  1992  . CORONARY ANGIOPLASTY WITH STENT PLACEMENT  12/15/11   "2"  . CORONARY ANGIOPLASTY WITH STENT PLACEMENT  y/2013   "1; makes total of 3" (05/02/2012)  . CORONARY ARTERY BYPASS GRAFT  06/13/2012   Procedure: CORONARY ARTERY BYPASS GRAFTING (CABG);  Surgeon: EGrace Isaac MD;  [facility];  Service: Open Heart Surgery;  Laterality: N/A;  cabg x four;  using left internal mammary artery, and left leg greater saphenous vein harvested endoscopically  . DILATION AND CURETTAGE OF UTERUS    . ESOPHAGOGASTRODUODENOSCOPY  01/20/2012   Procedure: ESOPHAGOGASTRODUODENOSCOPY (EGD);  Surgeon: Ladene Artist, MD,FACG;  [facility];  Service: Endoscopy;  Laterality: N/A;  . ESOPHAGOGASTRODUODENOSCOPY N/A 03/26/2013   Procedure: ESOPHAGOGASTRODUODENOSCOPY (EGD);  Surgeon: Irene Shipper, MD;  Location[facility] ENDOSCOPY;  Service: Endoscopy;  Laterality: N/A;  . ESOPHAGOGASTRODUODENOSCOPY N/A 04/30/2015    Procedure: ESOPHAGOGASTRODUODENOSCOPY (EGD);  Surgeon: Rogene Houston, MD;  [facility];  Service: Endoscopy;  Laterality: N/A;  1pm - moved to 10/20 @ 1:10  . INTRAOPERATIVE TRANSESOPHAGEAL ECHOCARDIOGRAM  06/13/2012   Procedure: INTRAOPERATIVE TRANSESOPHAGEAL ECHOCARDIOGRAM;  Surgeon: Grace Isaac, MD;  Location: [place];  Service: Open Heart Surgery;  Laterality: N/A;  . LEFT HEART CATHETERIZATION WITH CORONARY ANGIOGRAM N/A 12/15/2011   Procedure: LEFT HEART CATHETERIZATION WITH CORONARY ANGIOGRAM;  Surgeon: Burnell Blanks, MD;  Location: [facility] CATH LAB;  Service: Cardiovascular;  Laterality: N/A;  . LEFT HEART CATHETERIZATION WITH CORONARY ANGIOGRAM N/A 01/10/2012   Procedure: LEFT HEART CATHETERIZATION WITH CORONARY ANGIOGRAM;  Surgeon: Peter M Martinique, MD;  Location: [facility] CATH LAB;  Service: Cardiovascular;  Laterality: N/A;  . LEFT HEART CATHETERIZATION WITH CORONARY ANGIOGRAM N/A 06/08/2012   Procedure: LEFT HEART CATHETERIZATION WITH CORONARY ANGIOGRAM;  Surgeon: Burnell Blanks, MD;  Location: [facility] CATH LAB;  Service: Cardiovascular;  Laterality: N/A;  . LEFT HEART CATHETERIZATION WITH CORONARY/GRAFT ANGIOGRAM N/A 12/10/2013   Procedure: LEFT HEART CATHETERIZATION WITH Beatrix Fetters;  Surgeon: [name], MD;  Location: [facility] CATH LAB;  Service: Cardiovascular;  Laterality: N/A;  . OVARY SURGERY     ovarian cancer  . SHUNTOGRAM N/A 10/15/2013   Procedure: Fistulogram;  Surgeon: Serafina Mitchell, MD;  Location: [facility] CATH LAB;  Service: Cardiovascular;  Laterality: N/A;  . THROMBECTOMY / ARTERIOVENOUS GRAFT REVISION  2011   left upper arm  . TUBAL LIGATION  1980's   Social History   Social History  . Marital status: Married    Spouse name: N/A  . Number of children: N/A  . Years of education: N/A   Occupational History  . Not on file.   Social History Main Topics  . Smoking status: Never Smoker  . Smokeless tobacco: Never Used  . Alcohol use No   . Drug use: No  . Sexual activity: Yes    Birth control/ protection: Surgical   Other Topics Concern  . Not on file   Social History Narrative   Lives in [place], [place] with husband.  Dialysis pt - [facility].     Last set of Vital Signs (not current) Vitals:   05/12/16 0800 05/12/16 1139  BP:    Pulse: 88 85  Resp: 15 14  Temp:  98.2 F (36.8 C)      Physical Exam  Gen: unresponsive Cardiovascular: pulseless  Resp: apneic. Breath sounds equal bilaterally with bagging  Abd: nondistended  Neuro: GCS 2T Musculoskeletal: No deformity  Skin: warm  Procedures    CRITICAL CARE Performed by: Margette Fast Total critical care time: 15 Critical care time was exclusive of separately billable procedures and treating other patients. Critical care was necessary to treat or prevent imminent or life-threatening deterioration. Critical care was time spent personally by me on the following activities: development of treatment plan with patient and/or surrogate as well as  nursing, discussions with consultants, evaluation of patient's response to treatment, examination of patient, obtaining history from patient or surrogate, ordering and performing treatments and interventions, ordering and review of laboratory studies, ordering and review of radiographic studies, pulse oximetry and re-evaluation of patient's condition.  Cardiopulmonary Resuscitation (CPR) Procedure Note  Arrived to patient room with nursing staff and equipment. CPR initiated with 1 round of epi followed by bicarbonate and Calcium. Achieved ROSC after 6 minutes. Sinus tachycardia noted after ROSC.   Directed/Performed by: Margette Fast I personally directed ancillary staff and/or performed CPR in an effort to regain return of spontaneous circulation and to maintain cardiac, neuro and systemic perfusion.    Medical Decision making   Arrived to patient bedside with CODE BLUE team. Patient artery intubated following event  yesterday. Patient was pulseless. CPR initiated with current medications given. No defibrillating shocks.   Assessment and Plan   Plan for repeat labs and imaging. Hospitalist team at bedside to continue care.   Nanda Quinton, MD Emergency Medicine    Margette Fast, MD 05/12/16 [phone]

## 2016-05-12 NOTE — Progress Notes (Signed)
Subjective:    No events overnight  Objective:   Temp:  [97 F (36.1 C)-100 F (37.8 C)] 97 F (36.1 C) (11/02 0733) Pulse Rate:  [69-103] 88 (11/02 0800) Resp:  [13-29] 15 (11/02 0800) BP: (108-198)/(54-178) 165/77 (11/02 0530) SpO2:  [100 %] 100 % (11/02 0800) FiO2 (%):  [40 %-100 %] 40 % (11/02 0746) Weight:  [132 lb 0.9 oz (59.9 kg)-132 lb 4.4 oz (60 kg)] 132 lb 4.4 oz (60 kg) (11/02 0500) Last BM Date: 05/08/16  Filed Weights   05/11/16 0500 05/11/16 0900 05/12/16 0500  Weight: 132 lb 0.9 oz (59.9 kg) 132 lb 0.9 oz (59.9 kg) 132 lb 4.4 oz (60 kg)    Intake/Output Summary (Last 24 hours) at 05/12/16 0846 Last data filed at 05/12/16 0838  Gross per 24 hour  Intake            62.87 ml  Output             2250 ml  Net         -2187.13 ml    Telemetry: SR  Exam:  General: NAD  HEENT: sclera clear, throat clear  Resp: CTAB  Cardiac: RRR, no mr/g, no jvd  GI: abdomen soft, NT, ND  MSK: no LE edema  Neuro: no focal deficits  Psych: appropriate affect  Lab Results:  Basic Metabolic Panel:  Recent Labs Lab 05/11/16 0817 05/11/16 1410 05/12/16 0359  NA 133* 137 135  K 6.3* 4.0 5.6*  CL 92* 96* 95*  CO2 17* 27 26  GLUCOSE 235* 100* 90  BUN 50* 19 38*  CREATININE 9.09* 4.08* 6.35*  CALCIUM 10.5* 9.2 8.6*  MG 2.0  --   --     Liver Function Tests:  Recent Labs Lab 05/08/16 2326  05/11/16 0817 05/11/16 1410 05/12/16 0359  AST 18  --  711*  --  421*  ALT 13*  --  629*  --  529*  ALKPHOS 79  --  76  --  92  BILITOT 0.7  --  0.6  --  1.1  PROT 6.9  --  5.4*  --  6.5  ALBUMIN 3.7  < > 2.9* 3.6 3.4*  < > = values in this interval not displayed.  CBC:  Recent Labs Lab 05/09/16 1430 05/11/16 0817 05/12/16 0359  WBC 6.2 8.8 10.1  HGB 9.0* 9.7* 10.8*  HCT 29.4* 31.8* 34.9*  MCV 101.7* 102.9* 100.0  PLT 254 263 260    Cardiac Enzymes:  Recent Labs Lab 05/11/16 0817 05/11/16 1410 05/11/16 2010  TROPONINI 0.05* 1.27* 1.53*     BNP: No results for input(s): PROBNP in the last 8760 hours.  Coagulation:  Recent Labs Lab 05/08/16 2351  INR 0.92    ECG:   Medications:   Scheduled Medications: . amiodarone  200 mg Oral Daily  . aspirin EC  81 mg Oral Daily  . chlorhexidine gluconate (MEDLINE KIT)  15 mL Mouth Rinse BID  . chlorhexidine gluconate (MEDLINE KIT)  15 mL Mouth Rinse BID  . famotidine (PEPCID) IV  20 mg Intravenous Q24H  . heparin  5,000 Units Subcutaneous Q8H  . mouth rinse  15 mL Mouth Rinse QID  . mouth rinse  15 mL Mouth Rinse QID  . multivitamin  1 tablet Oral Daily  . NIFEdipine  30 mg Oral Daily  . simvastatin  20 mg Oral QHS     Infusions:     PRN Medications:  sodium chloride,  sodium chloride, acetaminophen, acetaminophen, ALPRAZolam, fentaNYL (SUBLIMAZE) injection, fentaNYL (SUBLIMAZE) injection, fluticasone, ipratropium-albuterol, labetalol, lidocaine (PF), lidocaine-prilocaine, midazolam, ondansetron (ZOFRAN) IV, pentafluoroprop-tetrafluoroeth     Assessment/Plan   1. Hypertensive emergency - admitted with severe hypertension SBP 230, multiple admits in the past with HTN urgency/emergency - home regimen clonidine 0.58m bid, labetlol 2036mbid - from discussions with nephrology the patient had done fairly well when on procardia in the past, though she had some LE edema at times. She has also had issues with severe symptomatic hypotension on hemodialysis  - weaned off labetlol and NG drips. Hypotension s/p PEA arrest yesterday. Now hypertensive again SBP 190s. Will restart NG drip, if extubated today can restart oral regimen. PRN labetalol ok as her bradycardia has resolved with improved K, but would still avoid labetlol drip.  - if not extubated today and restarted on orals, would start low dose clonidine patch to avoid withdrawal  2. Elevated troponin - s/p PEA arrest yesterday in setting of hyperkalemia - repeat trop to day to see if peaked - at this time do not  suspect ACS as primary cause.    3. ESRD - management per renal  4. PEA arrest - PEA arrest yesterday in setting of hyperkalemia. Telemetry reviewed, progressive sinus bradycardina into junctional bradycardia - likely due to severe hyperkalemia, do not suspect a primary cardiac etiology at this time       Shawna [name] DollyM.D.

## 2016-05-12 NOTE — Progress Notes (Signed)
[facility] Progress Note Patient Name: Shawna [name] DOB: 10-06-1940 MRN: [id number]   Date of Service  05/12/2016  HPI/Events of Note  Hypertension - BP = 198/78 with MAP = 109.  eICU Interventions  Will order: 1. Nitroglycerin IV infusion. Titrate to SBP < 160.     Intervention Category Major Interventions: Hypertension - evaluation and management  Lysle Dingwall 05/12/2016, 9:36 PM

## 2016-05-12 NOTE — Progress Notes (Signed)
Subjective: She remains intubated sedated and on the ventilator. She had dialysis yesterday. No new complaints have been noted. She is hemodynamically stable. Troponin level is up likely multi-factorial including demand ischemia and CPR  Objective: Vital signs in last 24 hours: Temp:  [97.5 F (36.4 C)-100 F (37.8 C)] 99 F (37.2 C) (11/02 0400) Pulse Rate:  [63-103] 73 (11/02 0530) Resp:  [13-29] 14 (11/02 0530) BP: (108-198)/(54-178) 165/77 (11/02 0530) SpO2:  [100 %] 100 % (11/02 0530) FiO2 (%):  [40 %-100 %] 40 % (11/02 0303) Weight:  [59.9 kg (132 lb 0.9 oz)-60 kg (132 lb 4.4 oz)] 60 kg (132 lb 4.4 oz) (11/02 0500) Weight change: 0 kg (0 lb) Last BM Date: 05/08/16  Intake/Output from previous day: 11/01 0701 - 11/02 0700 In: 71.9 [I.V.:71.9] Out: 2100 [Emesis/NG output:100]  PHYSICAL EXAM General appearance: Intubated sedated on mechanical ventilation looks comfortable Resp: rhonchi bilaterally Cardio: regular rate and rhythm, S1, S2 normal, no murmur, click, rub or gallop GI: soft, non-tender; bowel sounds normal; no masses,  no organomegaly Extremities: No clubbing or cyanosis Mucous membranes are moist. Skin warm and dry  Lab Results:  Results for orders placed or performed during the hospital encounter of 05/08/16 (from the past 48 hour(s))  Renal function panel     Status: Abnormal   Collection Time: 05/11/16  6:31 AM  Result Value Ref Range   Sodium 131 (L) 135 - 145 mmol/L   Potassium 6.4 (HH) 3.5 - 5.1 mmol/L    Comment: CRITICAL RESULT CALLED TO, READ BACK BY AND VERIFIED WITH: SPANGLER,E AT 7:30AM ON 05/11/16 BY FESTERMAN,C    Chloride 92 (L) 101 - 111 mmol/L   CO2 26 22 - 32 mmol/L   Glucose, Bld 89 65 - 99 mg/dL   BUN 47 (H) 6 - 20 mg/dL   Creatinine, Ser 8.58 (H) 0.44 - 1.00 mg/dL   Calcium 7.9 (L) 8.9 - 10.3 mg/dL   Phosphorus 7.0 (H) 2.5 - 4.6 mg/dL   Albumin 3.1 (L) 3.5 - 5.0 g/dL   GFR calc non Af Amer 4 (L) >60 mL/min   GFR calc Af Amer 5 (L)  >60 mL/min    Comment: (NOTE) The eGFR has been calculated using the CKD EPI equation. This calculation has not been validated in all clinical situations. eGFR's persistently <60 mL/min signify possible Chronic Kidney Disease.    Anion gap 13 5 - 15  Glucose, capillary     Status: None   Collection Time: 05/11/16  7:35 AM  Result Value Ref Range   Glucose-Capillary 77 65 - 99 mg/dL  Troponin I (q 6hr x 3)     Status: Abnormal   Collection Time: 05/11/16  8:17 AM  Result Value Ref Range   Troponin I 0.05 (HH) <0.03 ng/mL    Comment: CRITICAL VALUE NOTED.  VALUE IS CONSISTENT WITH PREVIOUSLY REPORTED AND CALLED VALUE.  CBC     Status: Abnormal   Collection Time: 05/11/16  8:17 AM  Result Value Ref Range   WBC 8.8 4.0 - 10.5 K/uL   RBC 3.09 (L) 3.87 - 5.11 MIL/uL   Hemoglobin 9.7 (L) 12.0 - 15.0 g/dL   HCT 31.8 (L) 36.0 - 46.0 %   MCV 102.9 (H) 78.0 - 100.0 fL   MCH 31.4 26.0 - 34.0 pg   MCHC 30.5 30.0 - 36.0 g/dL   RDW 17.3 (H) 11.5 - 15.5 %   Platelets 263 150 - 400 K/uL  Comprehensive metabolic panel  Status: Abnormal   Collection Time: 05/11/16  8:17 AM  Result Value Ref Range   Sodium 133 (L) 135 - 145 mmol/L   Potassium 6.3 (HH) 3.5 - 5.1 mmol/L    Comment: CRITICAL RESULT CALLED TO, READ BACK BY AND VERIFIED WITH: SHONEWITZ,L AT 9:05AM ON 05/11/16 BY FESTERMAN,C    Chloride 92 (L) 101 - 111 mmol/L   CO2 17 (L) 22 - 32 mmol/L   Glucose, Bld 235 (H) 65 - 99 mg/dL   BUN 50 (H) 6 - 20 mg/dL   Creatinine, Ser 9.09 (H) 0.44 - 1.00 mg/dL   Calcium 10.5 (H) 8.9 - 10.3 mg/dL    Comment: DELTA CHECK NOTED   Total Protein 5.4 (L) 6.5 - 8.1 g/dL   Albumin 2.9 (L) 3.5 - 5.0 g/dL   AST 711 (H) 15 - 41 U/L   ALT 629 (H) 14 - 54 U/L   Alkaline Phosphatase 76 38 - 126 U/L   Total Bilirubin 0.6 0.3 - 1.2 mg/dL   GFR calc non Af Amer 4 (L) >60 mL/min   GFR calc Af Amer 4 (L) >60 mL/min    Comment: (NOTE) The eGFR has been calculated using the CKD EPI equation. This  calculation has not been validated in all clinical situations. eGFR's persistently <60 mL/min signify possible Chronic Kidney Disease.   Magnesium     Status: None   Collection Time: 05/11/16  8:17 AM  Result Value Ref Range   Magnesium 2.0 1.7 - 2.4 mg/dL  Draw ABG 1 hour after initiation of ventilator     Status: Abnormal   Collection Time: 05/11/16 11:05 AM  Result Value Ref Range   FIO2 100.00    O2 Content 100.0 L/min   Delivery systems VENTILATOR    Mode PRESSURE REGULATED VOLUME CONTROL    VT 450 mL   LHR 18 resp/min   Peep/cpap 5.0 cm H20   pH, Arterial 7.566 (H) 7.350 - 7.450   pCO2 arterial 30.5 (L) 32.0 - 48.0 mmHg   pO2, Arterial 433 (H) 83.0 - 108.0 mmHg   Bicarbonate 29.8 (H) 20.0 - 28.0 mmol/L   Acid-Base Excess 5.2 (H) 0.0 - 2.0 mmol/L   O2 Saturation 97.9 %   Collection site RIGHT RADIAL    Drawn by [phone]    Sample type ARTERIAL    Allens test (pass/fail) PASS PASS  Troponin I (q 6hr x 3)     Status: Abnormal   Collection Time: 05/11/16  2:10 PM  Result Value Ref Range   Troponin I 1.27 (HH) <0.03 ng/mL    Comment: CRITICAL VALUE NOTED.  VALUE IS CONSISTENT WITH PREVIOUSLY REPORTED AND CALLED VALUE.  Renal function panel     Status: Abnormal   Collection Time: 05/11/16  2:10 PM  Result Value Ref Range   Sodium 137 135 - 145 mmol/L   Potassium 4.0 3.5 - 5.1 mmol/L    Comment: DELTA CHECK NOTED   Chloride 96 (L) 101 - 111 mmol/L   CO2 27 22 - 32 mmol/L   Glucose, Bld 100 (H) 65 - 99 mg/dL   BUN 19 6 - 20 mg/dL   Creatinine, Ser 4.08 (H) 0.44 - 1.00 mg/dL    Comment: DELTA CHECK NOTED   Calcium 9.2 8.9 - 10.3 mg/dL   Phosphorus 4.2 2.5 - 4.6 mg/dL   Albumin 3.6 3.5 - 5.0 g/dL   GFR calc non Af Amer 10 (L) >60 mL/min   GFR calc Af Amer 11 (L) >60  mL/min    Comment: (NOTE) The eGFR has been calculated using the CKD EPI equation. This calculation has not been validated in all clinical situations. eGFR's persistently <60 mL/min signify possible Chronic  Kidney Disease.    Anion gap 14 5 - 15  Troponin I (q 6hr x 3)     Status: Abnormal   Collection Time: 05/11/16  8:10 PM  Result Value Ref Range   Troponin I 1.53 (HH) <0.03 ng/mL    Comment: CRITICAL RESULT CALLED TO, READ BACK BY AND VERIFIED WITH: SMITH,J ON 05/11/16 AT 2105 BY LOY,C   CBC     Status: Abnormal   Collection Time: 05/12/16  3:59 AM  Result Value Ref Range   WBC 10.1 4.0 - 10.5 K/uL   RBC 3.49 (L) 3.87 - 5.11 MIL/uL   Hemoglobin 10.8 (L) 12.0 - 15.0 g/dL   HCT 34.9 (L) 36.0 - 46.0 %   MCV 100.0 78.0 - 100.0 fL   MCH 30.9 26.0 - 34.0 pg   MCHC 30.9 30.0 - 36.0 g/dL   RDW 17.3 (H) 11.5 - 15.5 %   Platelets 260 150 - 400 K/uL  Comprehensive metabolic panel     Status: Abnormal   Collection Time: 05/12/16  3:59 AM  Result Value Ref Range   Sodium 135 135 - 145 mmol/L   Potassium 5.6 (H) 3.5 - 5.1 mmol/L    Comment: DELTA CHECK NOTED   Chloride 95 (L) 101 - 111 mmol/L   CO2 26 22 - 32 mmol/L   Glucose, Bld 90 65 - 99 mg/dL   BUN 38 (H) 6 - 20 mg/dL   Creatinine, Ser 6.35 (H) 0.44 - 1.00 mg/dL   Calcium 8.6 (L) 8.9 - 10.3 mg/dL   Total Protein 6.5 6.5 - 8.1 g/dL   Albumin 3.4 (L) 3.5 - 5.0 g/dL   AST 421 (H) 15 - 41 U/L   ALT 529 (H) 14 - 54 U/L   Alkaline Phosphatase 92 38 - 126 U/L   Total Bilirubin 1.1 0.3 - 1.2 mg/dL   GFR calc non Af Amer 6 (L) >60 mL/min   GFR calc Af Amer 7 (L) >60 mL/min    Comment: (NOTE) The eGFR has been calculated using the CKD EPI equation. This calculation has not been validated in all clinical situations. eGFR's persistently <60 mL/min signify possible Chronic Kidney Disease.    Anion gap 14 5 - 15    ABGS  Recent Labs  05/11/16 1105  PHART 7.566*  PO2ART 433*  HCO3 29.8*   CULTURES Recent Results (from the past 240 hour(s))  MRSA PCR Screening     Status: None   Collection Time: 05/09/16  2:38 AM  Result Value Ref Range Status   MRSA by PCR NEGATIVE NEGATIVE Final    Comment:        The GeneXpert MRSA Assay  (FDA approved for NASAL specimens only), is one component of a comprehensive MRSA colonization surveillance program. It is not intended to diagnose MRSA infection nor to guide or monitor treatment for MRSA infections.    Studies/Results: Dg Chest Port 1 View  Result Date: 05/11/2016 CLINICAL DATA:  PICC line placement EXAM: PORTABLE CHEST 1 VIEW COMPARISON:  05/11/2016 FINDINGS: Internal jugular PICC line has been placed. The tip is in the SVC. No pneumothorax. Prior CABG. Heart is borderline in size. There is bibasilar atelectasis. Suspect small layering effusions. Endotracheal tube is 3.4 cm above the carina. NG tube enters the stomach. IMPRESSION: Right  internal jugular PICC line tip is in the SVC. No pneumothorax. Bibasilar atelectasis.  Suspect layering effusions. Electronically Signed   By: Rolm Baptise M.D.   On: 05/11/2016 15:16   Dg Chest Port 1 View  Result Date: 05/11/2016 CLINICAL DATA:  Intubated, post resuscitation EXAM: PORTABLE CHEST 1 VIEW COMPARISON:  05/08/2016 FINDINGS: Borderline cardiomegaly. Status post CABG. Endotracheal tube at the level of carina. Endotracheal tube should be retracted about 1 cm. No pneumothorax. Mild basilar atelectasis. No pulmonary edema. IMPRESSION: Endotracheal tube at the level of carina. Endotracheal tube should be retracted about 1 cm. No pneumothorax. No pulmonary edema. Electronically Signed   By: Lahoma Crocker M.D.   On: 05/11/2016 08:32    Medications:  Prior to Admission:  Prescriptions Prior to Admission  Medication Sig Dispense Refill Last Dose  . ALPRAZolam (XANAX) 0.25 MG tablet Take 1-2 tablets by mouth at bedtime as needed for sleep.    05/08/2016 at Unknown time  . amiodarone (PACERONE) 200 MG tablet Take 1 tablet (200 mg total) by mouth daily. 30 tablet 3 05/08/2016 at Unknown time  . aspirin EC 81 MG tablet Take 81 mg by mouth daily.    05/08/2016 at Unknown time  . cloNIDine (CATAPRES) 0.1 MG tablet Take 1 tablet (0.1 mg  total) by mouth 2 (two) times daily. 60 tablet 11 05/08/2016 at Unknown time  . epoetin alfa (EPOGEN,PROCRIT) 10071 UNIT/ML injection Inject 1 mL (10,000 Units total) into the vein every Monday, Wednesday, and Friday with hemodialysis. 1 mL 10 05/06/2016 at Unknown time  . fluticasone (FLONASE) 50 MCG/ACT nasal spray Place 2 sprays into the nose daily as needed for allergies.    unknown  . labetalol (NORMODYNE) 200 MG tablet Take 1 tablet (200 mg total) by mouth 2 (two) times daily. 90 tablet 1 05/08/2016 at Unknown time  . lanthanum (FOSRENOL) 1000 MG chewable tablet Chew 1 tablet (1,000 mg total) by mouth 3 (three) times daily with meals. 90 tablet 1 05/08/2016 at Unknown time  . multivitamin (RENA-VIT) TABS tablet Take 1 tablet by mouth daily.   05/08/2016 at Unknown time  . nitroGLYCERIN (NITROSTAT) 0.4 MG SL tablet Place 1 tablet (0.4 mg total) under the tongue every 5 (five) minutes as needed for chest pain. 25 tablet 3 05/08/2016 at Unknown time  . omeprazole (PRILOSEC) 20 MG capsule TAKE ONE CAPSULE BY MOUTH ONCE DAILY 30 capsule 5 05/08/2016 at Unknown time  . PROAIR HFA 108 (90 BASE) MCG/ACT inhaler Inhale 1 puff into the lungs every 6 (six) hours as needed for wheezing or shortness of breath.    unknown  . SENSIPAR 30 MG tablet Take 30 mg by mouth daily with supper.    05/08/2016 at Unknown time  . simvastatin (ZOCOR) 20 MG tablet Take 1 tablet (20 mg total) by mouth at bedtime. 90 tablet 3 05/07/2016 at Unknown time   Scheduled: . amiodarone  200 mg Oral Daily  . aspirin EC  81 mg Oral Daily  . chlorhexidine gluconate (MEDLINE KIT)  15 mL Mouth Rinse BID  . chlorhexidine gluconate (MEDLINE KIT)  15 mL Mouth Rinse BID  . famotidine (PEPCID) IV  20 mg Intravenous Q24H  . heparin  5,000 Units Subcutaneous Q8H  . mouth rinse  15 mL Mouth Rinse QID  . mouth rinse  15 mL Mouth Rinse QID  . multivitamin  1 tablet Oral Daily  . NIFEdipine  30 mg Oral Daily  . simvastatin  20 mg Oral QHS  Continuous:  YZJ:QDUKRC chloride, sodium chloride, acetaminophen, acetaminophen, ALPRAZolam, fentaNYL (SUBLIMAZE) injection, fentaNYL (SUBLIMAZE) injection, fluticasone, ipratropium-albuterol, labetalol, lidocaine (PF), lidocaine-prilocaine, midazolam, ondansetron (ZOFRAN) IV, pentafluoroprop-tetrafluoroeth  Assesment: She had CODE BLUE yesterday and required CPR intubation and mechanical ventilation. This seems to have been related to her elevated potassium level. She had dialysis yesterday and is improved. She is sedated now per nursing staff she woke up and was able to look around although it's not totally clear that she was following commands.  She has coronary disease her troponin level is that this is thought more to be from CPR chronic renal failure demand ischemia.  She has chronic diastolic heart failure  She has end-stage renal disease on dialysis  She was admitted with hypertensive crisis but her blood pressures running around 150 now. Principal Problem:   Hypertensive crisis Active Problems:   Coronary atherosclerosis of native coronary artery   ESRD on hemodialysis (HCC)   Anemia of chronic kidney failure   Chronic diastolic CHF (congestive heart failure) (HCC)   Chest pain   Hypertensive crisis without congestive heart failure    Plan: I think we can see if she can be weaned off the ventilator. This will depend to some extent on her mental status considering her significant events of yesterday. She remains critically ill with life-threatening illness    LOS: 3 days   Dayn Barich L 05/12/2016, 7:14 AM

## 2016-05-12 NOTE — Progress Notes (Addendum)
Subjective: Interval History: Patient remained intubated.  Objective: Vital signs in last 24 hours: Temp:  [97 F (36.1 C)-100 F (37.8 C)] 97 F (36.1 C) (11/02 0733) Pulse Rate:  [69-103] 88 (11/02 0800) Resp:  [13-29] 15 (11/02 0800) BP: (108-198)/(54-120) 165/77 (11/02 0530) SpO2:  [100 %] 100 % (11/02 0800) FiO2 (%):  [40 %-100 %] 40 % (11/02 0746) Weight:  [60 kg (132 lb 4.4 oz)] 60 kg (132 lb 4.4 oz) (11/02 0500) Weight change: 0 kg (0 lb)  Intake/Output from previous day: 11/01 0701 - 11/02 0700 In: 71.9 [I.V.:71.9] Out: 2100 [Emesis/NG output:100] Intake/Output this shift: Total I/O In: 0  Out: 150 [Emesis/NG output:150]  Patient is presently alert but remained intubated. Chest: Inspiratory crackles Heart: RRR  Extremties: No edema  Lab Results:  Recent Labs  05/11/16 0817 05/12/16 0359  WBC 8.8 10.1  HGB 9.7* 10.8*  HCT 31.8* 34.9*  PLT 263 260   BMET:   Recent Labs  05/11/16 1410 05/12/16 0359  NA 137 135  K 4.0 5.6*  CL 96* 95*  CO2 27 26  GLUCOSE 100* 90  BUN 19 38*  CREATININE 4.08* 6.35*  CALCIUM 9.2 8.6*   No results for input(s): PTH in the last 72 hours. Iron Studies: No results for input(s): IRON, TIBC, TRANSFERRIN, FERRITIN in the last 72 hours.  Studies/Results: Portable Chest Xray  Result Date: 05/12/2016 CLINICAL DATA:  Intubation. EXAM: PORTABLE CHEST 1 VIEW COMPARISON:  05/11/2016. FINDINGS: Endotracheal tube, NG tube, right IJ line in stable position. Prior CABG. Normal pulmonary vascularity. Low lung volumes. Small bilateral pleural effusions again cannot be excluded. IMPRESSION: 1. Lines and tubes in stable position. 2. Prior CABG.  Heart size stable. 3. Low lung volumes with bibasilar atelectasis again noted. Small bilateral pleural effusions again cannot be excluded. Chest is unchanged from prior exam. Electronically Signed   By: Marcello Moores  Register   On: 05/12/2016 07:38   Dg Chest Port 1 View  Result Date:  05/11/2016 CLINICAL DATA:  PICC line placement EXAM: PORTABLE CHEST 1 VIEW COMPARISON:  05/11/2016 FINDINGS: Internal jugular PICC line has been placed. The tip is in the SVC. No pneumothorax. Prior CABG. Heart is borderline in size. There is bibasilar atelectasis. Suspect small layering effusions. Endotracheal tube is 3.4 cm above the carina. NG tube enters the stomach. IMPRESSION: Right internal jugular PICC line tip is in the SVC. No pneumothorax. Bibasilar atelectasis.  Suspect layering effusions. Electronically Signed   By: Rolm Baptise M.D.   On: 05/11/2016 15:16   Dg Chest Port 1 View  Result Date: 05/11/2016 CLINICAL DATA:  Intubated, post resuscitation EXAM: PORTABLE CHEST 1 VIEW COMPARISON:  05/08/2016 FINDINGS: Borderline cardiomegaly. Status post CABG. Endotracheal tube at the level of carina. Endotracheal tube should be retracted about 1 cm. No pneumothorax. Mild basilar atelectasis. No pulmonary edema. IMPRESSION: Endotracheal tube at the level of carina. Endotracheal tube should be retracted about 1 cm. No pneumothorax. No pulmonary edema. Electronically Signed   By: Lahoma Crocker M.D.   On: 05/11/2016 08:32    I have reviewed the patient's current medications.  Assessment/Plan: Problem #1 patient with episode of bradycardia/unresponsive/status post CODE BLUE and intubation yesterday. Presently patient is more awake but remains intubated. Problem #2 hypertension: Her blood pressure at this moment seems to be still high. Problem #3 end-stage renal disease: She is status post hemodialysis yesterday. Her potassium has improved but still high. Problem #5 anemia: Her hemoglobin has come up to our target goal. Presently  she is on Epogen. Problem #6 fluid management: Patient status post hemodialysis yesterday with 2 L ultrafiltration .Marland Kitchen Problem #7 metabolic bone disease: Her calcium is normal at the phosphorus is high. Her binder was discontinued because of hypophosphatemia. Plan: 1]Will dialyze  patient today for 4 hours and used to K/2.5 calcium bath 2] will check renal panel in the morning 3] will remove 2 L if her systolic blood pressure remains stable 4] will continue with Epogen with dialysis.   LOS: 3 days   Laquenta Whitsell S 05/12/2016,9:43 AM  Called after patient became bradycardic after receiving iv Labetalol for hypertenion before dialysis. Code blue was called and patient given epinephrine with some recovery. Presently on dialysis with out fluid removal to help correct her potassium. Discussed with her husband about her condition and wants to continue with full code even though this is second episode since her admission. Patient has episode of passing out on dialysis mainly from her sbp < 120 but not from bradycardia. Her husband thinks she will recover from this even though the cause is some what different.

## 2016-05-12 NOTE — Procedures (Signed)
   HEMODIALYSIS TREATMENT NOTE:  4 hour heparin-free dialysis completed via left upper arm AVF (15g/antegrade). NO UF; kept even per MD order (UF goal was changed after pt experienced bradycardia/PEA 10 minutes prior to initiation of HD).  Fluctuating intradialytic blood pressures with SBPs ranging from  70-142 despite NO ultrafiltration.  All blood was returned and hemostasis was achieved within 15 minutes. Report given to Hall Busing, RN.  Rockwell Alexandria, RN, CDN

## 2016-05-12 NOTE — Consult Note (Signed)
Consultation Note Date: 05/12/2016   Patient Name: Shawna [name]  DOB: 07-28-1940  MRN: [id number]  Age / Sex: 75 y.o., female  PCP: [name], MD Referring Physician: Kathie Dike, MD  Reason for Consultation: Establishing goals of care  HPI/Patient Profile: 75 y.o. female  with past medical history of end-stage renal disease on dialysis for 5 years admitted on 05/08/2016 with hypertensive crisis.   Clinical Assessment and Goals of Care: Shawna [name] is resting in bed, intubated/ventilated. Her husband is at her bedside. We talk about her functional status prior to this hospitalization. Shawna [name] shares that she was able to take care of her bathing and dressing, occasionally he would help her take her shirt off over her head. He also shares that she did most of the cooking and cleaning around the home. He denies difficulty with dialysis treatments, including fatigue afterwards.  He states they only have one surviving daughter, in [place]. He shares that he has called her and she is on the way. He understands that she is very ill.   I share my worry that Shawna [name] will likely need CPR again in the near future. We talk about what life will look like for her, the likelihood that she will need placement in a nursing home. Shawna [name] states that he feels he must do everything he can to help her survive regardless of outcomes. No further questions at this time. I will continue to follow this family.   Healthcare power of attorney NEXT OF KIN - husband   SUMMARY OF RECOMMENDATIONS   Shawna [name] requesting all measures be performed at this time including continued CPR. He states he must feel like he has tried everything.  Code Status/Advance Care Planning:  Full code - we discussed the realities of CPR and chest compressions. I advise that Shawna [name] is likely to need this intervention again in the future. I share  my worry that she will have decreased functional status, likely requiring placement in a nursing home. Shawna [name] shares that he must continue all measures, "feel that I have tried everything".   Symptom Management:   per hospitalist  Palliative Prophylaxis:   Aspiration and Turn Reposition  Additional Recommendations (Limitations, Scope, Preferences):  Full Scope Treatment  Psycho-social/Spiritual:   Desire for further Chaplaincy support:no  Additional Recommendations: Caregiving  Support/Resources and ICU Family Guide  Prognosis:   < 6 weeks, likely based on 2 codes in 2 days, ESRD on HD, functional decline.   Discharge Planning: To Be Determined      Primary Diagnoses: Present on Admission: . (Resolved) Hypertensive crisis without congestive heart failure . Anemia of chronic kidney failure . Chest pain . Chronic diastolic CHF (congestive heart failure) ([place]) . Coronary atherosclerosis of native coronary artery . Hypertensive crisis . Hypertensive crisis without congestive heart failure . Acute respiratory failure ([place])   I have reviewed the medical record, interviewed the patient and family, and examined the patient. The following aspects are pertinent.  Past Medical History:  Diagnosis Date  .  Anxiety   . Arthritis   . AVM (arteriovenous malformation) of colon   . CAD (coronary artery disease)    a. 12/2011 NSTEMI/Cath/PCI LCX (2.25x14 Resolute DES) & D1 (2.25x22 Resolute DES);  b. 01/2012 Cath/PCI: LM 30, LAD 30p, 40-59m D1 stent ok, 99 in sm branch of diag, LCX patent stent, OM1 20, RCA 95 ost (4.0x12 Promus DES), EF 55%;  c. 04/2012 Lexi Cardiolite  EF 48%, small area of scar @ base/mid inflat wall with mild peri-infarct ischemia.; CABG 12/4  . Carotid artery disease ([facility]    a. 601-60%LICA, 91/0932  . Chronic bronchitis ([facility]   . Chronic diastolic CHF (congestive heart failure) ([place] [place]    a. 02/2012 Echo EF 60-65%, nl wall motion, Gr 1 DD, mod MR  . Colon  cancer ([facility] 1992  . Esophageal stricture   . ESRD on hemodialysis ([facility]    ESRD due to HTN, [facility] 2011 [facility]  Access is LUA AVF as of Sept 2014.   .Marland KitchenGERD (gastroesophageal reflux disease)   . High cholesterol 12/2011  . History of blood transfusion 07/2011; 12/2011; 01/2012 X 2; 04/2012  . History of gout   . History of lower GI bleeding   . Hypertension   . Iron deficiency anemia   . Mitral regurgitation    a. Moderate by echo, 02/2012  . Myocardial infarction   . Ovarian cancer ([facility] 1992  . Pneumonia ~ 2009  . PUD (peptic ulcer disease)   . TIA (transient ischemic attack)    Social History   Social History  . Marital status: Married    Spouse name: N/A  . Number of children: N/A  . Years of education: N/A   Social History Main Topics  . Smoking status: Never Smoker  . Smokeless tobacco: Never Used  . Alcohol use No  . Drug use: No  . Sexual activity: Yes    Birth control/ protection: Surgical   Other Topics Concern  . None   Social History Narrative   [place]  D[facility] pt - [facility].   Family History  Problem Relation Age of Onset  . Heart disease Mother     Heart Disease before age 76 . Hyperlipidemia Mother   . Hypertension Mother   . Diabetes Mother   . Heart attack Mother   . Heart disease Father     Heart Disease before age 76 . Hyperlipidemia Father   . Hypertension Father   . Diabetes Father   . Diabetes Sister   . Hypertension Sister   . Diabetes Brother   . Hyperlipidemia Brother   . Heart attack Brother   . Hypertension Sister   . Heart attack Brother   . Other      noncontributory for early CAD  . Colon cancer Neg Hx   . Esophageal cancer Neg Hx   . Liver disease Neg Hx   . Kidney disease Neg Hx   . Colon polyps Neg Hx    Scheduled Meds: . amiodarone  200 mg Oral Daily  . aspirin EC  81 mg Oral Daily  . chlorhexidine gluconate (MEDLINE KIT)  15 mL  Mouth Rinse BID  . chlorhexidine gluconate (MEDLINE KIT)  15 mL Mouth Rinse BID  . famotidine (PEPCID) IV  20 mg Intravenous Q24H  . heparin  5,000 Units Subcutaneous Q8H  . mouth rinse  15 mL Mouth Rinse  QID  . mouth rinse  15 mL Mouth Rinse QID  . multivitamin  1 tablet Oral Daily  . NIFEdipine  30 mg Oral Daily   Continuous Infusions:  PRN Meds:.sodium chloride, sodium chloride, sodium chloride, sodium chloride, acetaminophen, acetaminophen, ALPRAZolam, fentaNYL (SUBLIMAZE) injection, fentaNYL (SUBLIMAZE) injection, fluticasone, ipratropium-albuterol, labetalol, lidocaine (PF), lidocaine-prilocaine, midazolam, ondansetron (ZOFRAN) IV, pentafluoroprop-tetrafluoroeth Medications Prior to Admission:  Prior to Admission medications   Medication Sig Start Date End Date Taking? Authorizing Provider  ALPRAZolam Duanne Moron) 0.25 MG tablet Take 1-2 tablets by mouth at bedtime as needed for sleep.  11/19/15  Yes Historical Provider, MD  amiodarone (PACERONE) 200 MG tablet Take 1 tablet (200 mg total) by mouth daily. 09/23/15  Yes Herminio Commons, MD  aspirin EC 81 MG tablet Take 81 mg by mouth daily.    Yes Historical Provider, MD  cloNIDine (CATAPRES) 0.1 MG tablet Take 1 tablet (0.1 mg total) by mouth 2 (two) times daily. 04/08/16  Yes Orvan Falconer, MD  epoetin alfa (EPOGEN,PROCRIT) 95284 UNIT/ML injection Inject 1 mL (10,000 Units total) into the vein every Monday, Wednesday, and Friday with hemodialysis. 04/11/16  Yes Orvan Falconer, MD  fluticasone (FLONASE) 50 MCG/ACT nasal spray Place 2 sprays into the nose daily as needed for allergies.    Yes Historical Provider, MD  labetalol (NORMODYNE) 200 MG tablet Take 1 tablet (200 mg total) by mouth 2 (two) times daily. 04/08/16  Yes Orvan Falconer, MD  lanthanum (FOSRENOL) 1000 MG chewable tablet Chew 1 tablet (1,000 mg total) by mouth 3 (three) times daily with meals. 04/08/16  Yes Orvan Falconer, MD  multivitamin (RENA-VIT) TABS tablet Take 1 tablet by mouth daily.   Yes  Historical Provider, MD  nitroGLYCERIN (NITROSTAT) 0.4 MG SL tablet Place 1 tablet (0.4 mg total) under the tongue every 5 (five) minutes as needed for chest pain. 04/13/16 07/12/16 Yes Herminio Commons, MD  omeprazole (PRILOSEC) 20 MG capsule TAKE ONE CAPSULE BY MOUTH ONCE DAILY 04/15/16  Yes Butch Penny, NP  PROAIR HFA 108 (90 BASE) MCG/ACT inhaler Inhale 1 puff into the lungs every 6 (six) hours as needed for wheezing or shortness of breath.  01/09/15  Yes Historical Provider, MD  SENSIPAR 30 MG tablet Take 30 mg by mouth daily with supper.  12/21/12  Yes Historical Provider, MD  simvastatin (ZOCOR) 20 MG tablet Take 1 tablet (20 mg total) by mouth at bedtime. 04/04/16  Yes Lendon Colonel, NP      Review of Systems  Unable to perform ROS: Intubated    Physical Exam  Constitutional:  Intubated/ventilated, intermittent sedation  HENT:  Head: Normocephalic and atraumatic.  Pulmonary/Chest:  Intubated/ventilated  Abdominal: Soft. She exhibits no distension.  Neurological:  Intubated/ventilated, intermittent sedation  Skin: Skin is warm and dry.  Nursing note and vitals reviewed.   Vital Signs: BP (!) 108/94   Pulse 94   Temp 98.1 F (36.7 C) (Oral)   Resp (!) 31   Ht _0  (1.549 m)   Wt 60.3 kg (132 lb 15 oz)   SpO2 100%   BMI 25.12 kg/m  Pain Assessment: CPOT POSS *See Group Information*: S-Acceptable,Sleep, easy to arouse Pain Score: 0-No pain   SpO2: SpO2: 100 % O2 Device:SpO2: 100 % O2 Flow Rate: .O2 Flow Rate (L/min): 2 L/min  IO: Intake/output summary:  Intake/Output Summary (Last 24 hours) at 05/12/16 1610 Last data filed at 05/12/16 1200  Gross per 24 hour  Intake  8.1 ml  Output              700 ml  Net           -691.9 ml    LBM: Last BM Date: 05/08/16 Baseline Weight: Weight: 60.3 kg (133 lb) Most recent weight: Weight: 60.3 kg (132 lb 15 oz)     Palliative Assessment/Data:   Flowsheet Rows   Flowsheet Row Most Recent Value    Intake Tab  Referral Department  Hospitalist  Unit at Time of Referral  ICU  Palliative Care Primary Diagnosis  Cardiac  Date Notified  05/12/16  Palliative Care Type  New Palliative care  Reason for referral  Clarify Goals of Care, Advance Care Planning  Date of Admission  05/08/16  Date first seen by Palliative Care  05/12/16  # of days Palliative referral response time  0 Day(s)  # of days IP prior to Palliative referral  4  Clinical Assessment  Palliative Performance Scale Score  10%  Pain Max last 24 hours  Not able to report  Pain Min Last 24 hours  Not able to report  Dyspnea Max Last 24 Hours  Not able to report  Dyspnea Min Last 24 hours  Not able to report  Psychosocial & Spiritual Assessment  Palliative Care Outcomes  Patient/Family meeting held?  Yes  Who was at the meeting?  Husband  Palliative Care Outcomes  Clarified goals of care, Provided psychosocial or spiritual support  Palliative Care follow-up planned  -- [Follow-up while at [facility]]      Time In: 1300 Time Out: 1335 Time Total: 35 minutes Greater than 50%  of this time was spent counseling and coordinating care related to the above assessment and plan.  Signed by: Drue Novel, NP   Please contact Palliative Medicine Team phone at [phone] for questions and concerns.  For individual provider: See Shea Evans

## 2016-05-13 ENCOUNTER — Inpatient Hospital Stay (HOSPITAL_COMMUNITY): Payer: Medicare HMO

## 2016-05-13 LAB — BLOOD GAS, ARTERIAL
Acid-Base Excess: 3.1 mmol/L — ABNORMAL HIGH (ref 0.0–2.0)
Bicarbonate: 27.3 mmol/L (ref 20.0–28.0)
Drawn by: 405301
FIO2: 0.4
MECHVT: 380 mL
O2 Saturation: 98.6 %
PATIENT TEMPERATURE: 37
PCO2 ART: 37.5 mmHg (ref 32.0–48.0)
PEEP: 5 cmH2O
PO2 ART: 165 mmHg — AB (ref 83.0–108.0)
RATE: 14 resp/min
pH, Arterial: 7.466 — ABNORMAL HIGH (ref 7.350–7.450)

## 2016-05-13 LAB — CBC
HCT: 31.3 % — ABNORMAL LOW (ref 36.0–46.0)
Hemoglobin: 9.9 g/dL — ABNORMAL LOW (ref 12.0–15.0)
MCH: 31.3 pg (ref 26.0–34.0)
MCHC: 31.6 g/dL (ref 30.0–36.0)
MCV: 99.1 fL (ref 78.0–100.0)
PLATELETS: 218 10*3/uL (ref 150–400)
RBC: 3.16 MIL/uL — ABNORMAL LOW (ref 3.87–5.11)
RDW: 17.3 % — AB (ref 11.5–15.5)
WBC: 10.8 10*3/uL — ABNORMAL HIGH (ref 4.0–10.5)

## 2016-05-13 LAB — RENAL FUNCTION PANEL
ALBUMIN: 3.1 g/dL — AB (ref 3.5–5.0)
Anion gap: 15 (ref 5–15)
BUN: 47 mg/dL — ABNORMAL HIGH (ref 6–20)
CALCIUM: 8.4 mg/dL — AB (ref 8.9–10.3)
CO2: 27 mmol/L (ref 22–32)
CREATININE: 6.74 mg/dL — AB (ref 0.44–1.00)
Chloride: 96 mmol/L — ABNORMAL LOW (ref 101–111)
GFR calc non Af Amer: 5 mL/min — ABNORMAL LOW (ref >60)
GFR, EST AFRICAN AMERICAN: 6 mL/min — AB (ref >60)
GLUCOSE: 127 mg/dL — AB (ref 65–99)
PHOSPHORUS: 6.1 mg/dL — AB (ref 2.5–4.6)
Potassium: 4.4 mmol/L (ref 3.5–5.1)
SODIUM: 138 mmol/L (ref 135–145)

## 2016-05-13 LAB — GLUCOSE, CAPILLARY: GLUCOSE-CAPILLARY: 125 mg/dL — AB (ref 65–99)

## 2016-05-13 IMAGING — CR DG CHEST 1V PORT
1 series · 1 of 1 positions shown · non-contrast
Comparison: [DATE] .

CLINICAL DATA: Respiratory failure.

EXAM:
PORTABLE CHEST 1 VIEW

[portable]
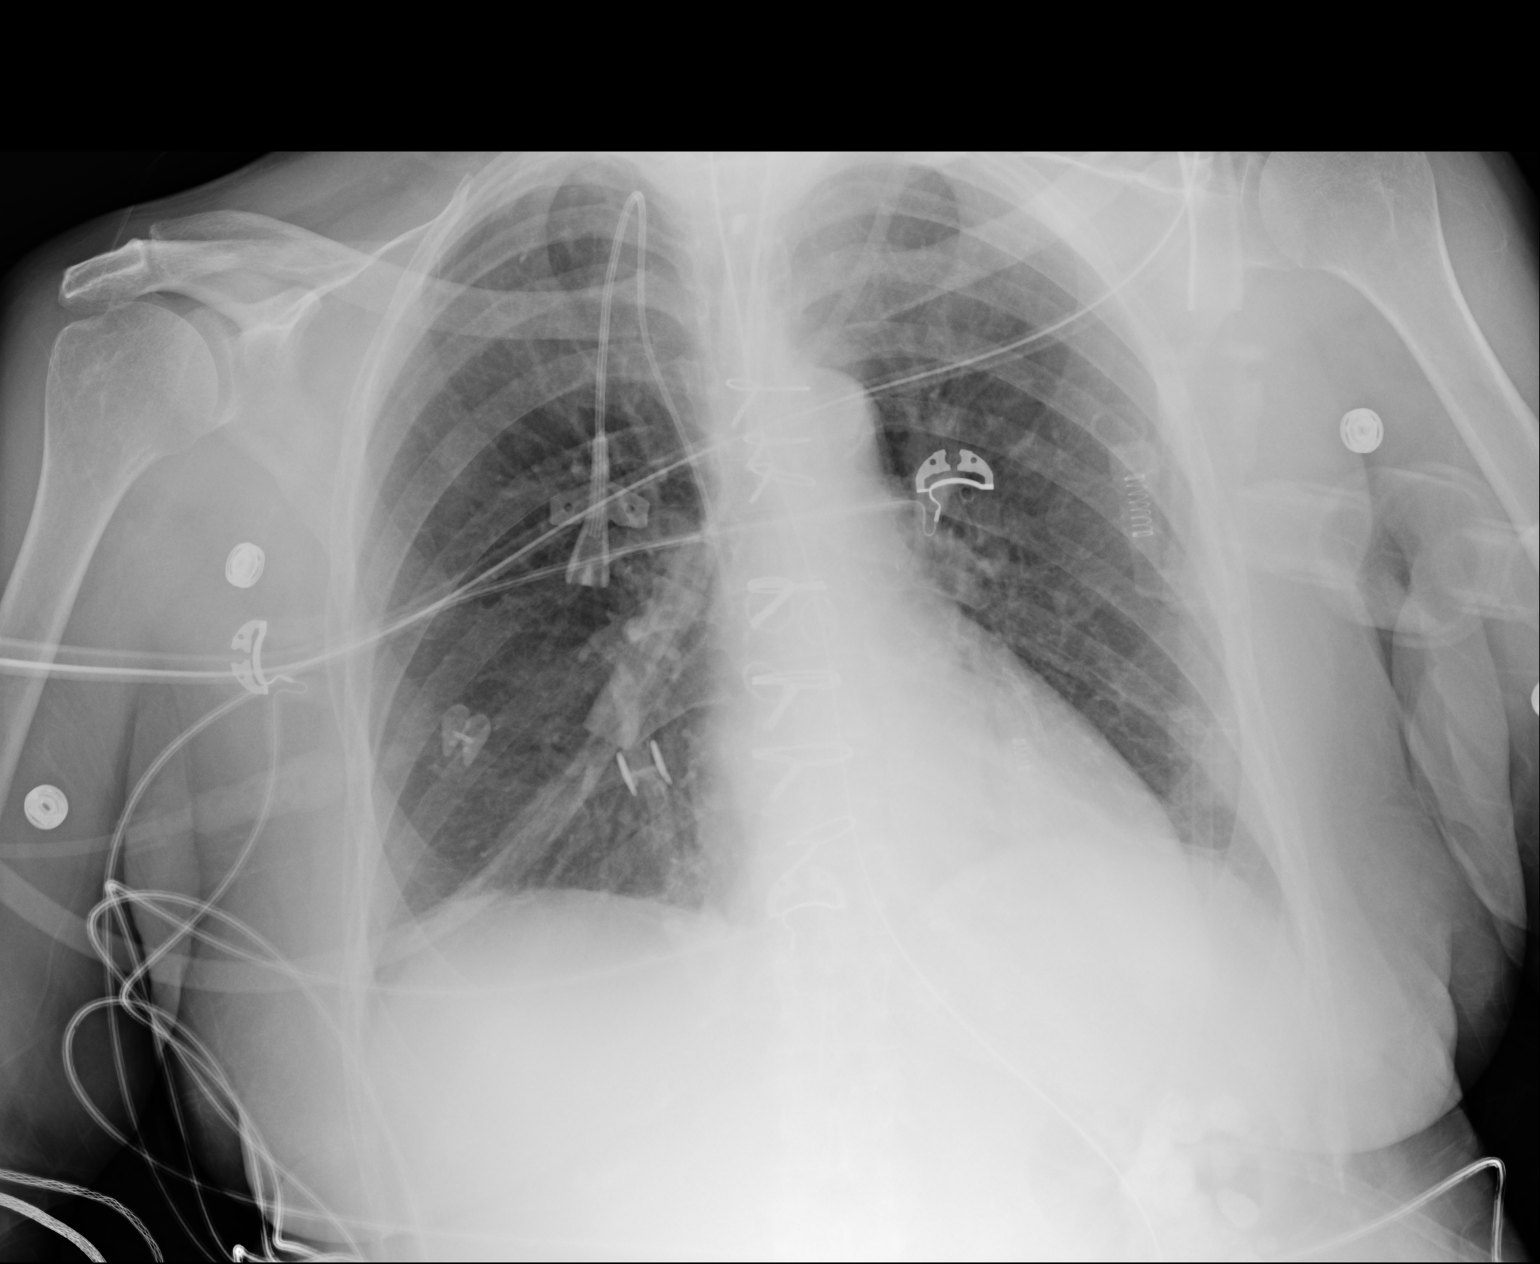

[1 of 1 positions shown; findings below may reference images not displayed]

FINDINGS: Endotracheal tube, feeding tube, right IJ line in stable position.
Prior CABG. Heart size normal. Low lung volumes with mild basilar
atelectasis. No prominent pleural effusion or pneumothorax .
IMPRESSION: 1. Lines and tubes in stable position.
2. Prior CABG.  Heart size normal.
3. Low lung volumes with mild basilar atelectasis.

## 2016-05-13 NOTE — Progress Notes (Signed)
Primary cardiologist:  Subjective:    Sedated this AM. No events overnight.   Objective:   Temp:  [97 F (36.1 C)-99.1 F (37.3 C)] 98.1 F (36.7 C) (11/03 0900) Pulse Rate:  [79-104] 104 (11/03 0945) Resp:  [11-38] 17 (11/03 0945) BP: (70-213)/(40-94) 194/94 (11/03 0945) SpO2:  [100 %] 100 % (11/03 0945) FiO2 (%):  [40 %] 40 % (11/03 0311) Weight:  [130 lb 11.7 oz (59.3 kg)-132 lb 15 oz (60.3 kg)] 130 lb 11.7 oz (59.3 kg) (11/03 0455) Last BM Date: 05/08/16  Filed Weights   05/12/16 0500 05/12/16 1340 05/13/16 0455  Weight: 132 lb 4.4 oz (60 kg) 132 lb 15 oz (60.3 kg) 130 lb 11.7 oz (59.3 kg)    Intake/Output Summary (Last 24 hours) at 05/13/16 1050 Last data filed at 05/13/16 0600  Gross per 24 hour  Intake           270.15 ml  Output              142 ml  Net           128.15 ml    Telemetry: SR. NSVT after PEA arrest.   Exam:  General: NAD  HEENT: sclera clear, throat clear  Resp: CTAB  Cardiac: RRR, no m//r,g no jvd  GI: abdomen soft, NT, ND  MSK: no LE edema  Neuro: no focal deficits  Psych: appropriate affect  Lab Results:  Basic Metabolic Panel:  Recent Labs Lab 05/11/16 0817  05/12/16 0359 05/12/16 1404 05/13/16 0430  NA 133*  < > 135 139 138  K 6.3*  < > 5.6* 5.3* 4.4  CL 92*  < > 95* 95* 96*  CO2 17*  < > 26 28 27   GLUCOSE 235*  < > 90 162* 127*  BUN 50*  < > 38* 49* 47*  CREATININE 9.09*  < > 6.35* 7.49* 6.74*  CALCIUM 10.5*  < > 8.6* 11.0* 8.4*  MG 2.0  --   --   --   --   < > = values in this interval not displayed.  Liver Function Tests:  Recent Labs Lab 05/08/16 2326  05/11/16 0817 05/11/16 1410 05/12/16 0359 05/13/16 0430  AST 18  --  711*  --  421*  --   ALT 13*  --  629*  --  529*  --   ALKPHOS 79  --  76  --  92  --   BILITOT 0.7  --  0.6  --  1.1  --   PROT 6.9  --  5.4*  --  6.5  --   ALBUMIN 3.7  < > 2.9* 3.6 3.4* 3.1*  < > = values in this interval not displayed.  CBC:  Recent Labs Lab  05/12/16 0359 05/12/16 1404 05/13/16 0430  WBC 10.1 18.0* 10.8*  HGB 10.8* 10.2* 9.9*  HCT 34.9* 33.0* 31.3*  MCV 100.0 100.6* 99.1  PLT 260 253 218    Cardiac Enzymes:  Recent Labs Lab 05/11/16 1410 05/11/16 2010 05/12/16 0948  TROPONINI 1.27* 1.53* 0.66*    BNP: No results for input(s): PROBNP in the last 8760 hours.  Coagulation:  Recent Labs Lab 05/08/16 2351  INR 0.92    ECG:   Medications:   Scheduled Medications: . amiodarone  200 mg Oral Daily  . aspirin EC  81 mg Oral Daily  . chlorhexidine gluconate (MEDLINE KIT)  15 mL Mouth Rinse BID  . chlorhexidine gluconate (MEDLINE KIT)  15  mL Mouth Rinse BID  . famotidine (PEPCID) IV  20 mg Intravenous Q24H  . heparin  5,000 Units Subcutaneous Q8H  . mouth rinse  15 mL Mouth Rinse QID  . mouth rinse  15 mL Mouth Rinse QID  . multivitamin  1 tablet Oral Daily  . NIFEdipine  30 mg Oral Daily     Infusions: . nitroGLYCERIN 70 mcg/min (05/13/16 0920)     PRN Medications:  sodium chloride, sodium chloride, sodium chloride, sodium chloride, acetaminophen, acetaminophen, ALPRAZolam, fentaNYL (SUBLIMAZE) injection, fentaNYL (SUBLIMAZE) injection, fluticasone, ipratropium-albuterol, lidocaine (PF), lidocaine-prilocaine, midazolam, ondansetron (ZOFRAN) IV, pentafluoroprop-tetrafluoroeth     Assessment/Plan    1. Hypertensive emergency - admitted with severe hypertension SBP 230, multiple admits in the past with HTN urgency/emergency - home regimen clonidine 0.14m bid, labetlol 2057mbid - from discussions with nephrology the patient had done fairly well when on procardia in the past, though she had some LE edema at times. She has also had issues with severe symptomatic hypotension on hemodialysis  - labile bp's, were her PEA arrest x 2 and subsequent hypotension we are allowing higher bp's at this time. - if needed would use low dose prn hydralazine, avoid labetlol due to previous bradycardia.  - can  continue NG drip at this time,would not overally be aggressive with lowering her bp's due to her historic labile bps.   2. Elevated troponin - s/p PEA arrest yesterday in setting of hyperkalemia - troponin peaked to 1.5, trending down. EKG with chronic changes but no acute ischemic changes.  - at this time do not suspect ACS as primary cause. Elevated troponin in setting of systemic hypotension in setting of PEA arrest.    3. ESRD - management per renal  4. PEA arrest - PEA arrest yesterday in setting of hyperkalemia. Telemetry reviewed, progressive sinus bradycardina into junctional bradycardia/ Repeat arrest later in the day. She is s/p HD, K down to 4.4 today.  - likely due to severe hyperkalemia, do not suspect a primary cardiac etiology at this time - avoid av nodal agents. If extubated would not restart her labetlol, would use nifedipine and hydralazine.    5. Right arm swelling - order venous [place]   6. NSVT - noted on telemetry, occurred post brady/PEA arrest after receiving epi   JoCarlyle DollyM.D.

## 2016-05-13 NOTE — Plan of Care (Signed)
Problem: Role Relationship: Goal: Method of communication will improve Outcome: Progressing Pt able to answer yes or no questions using gestures and nodding.

## 2016-05-13 NOTE — Progress Notes (Signed)
Daily Progress Note   Patient Name: Shawna [name]       Date: 05/13/2016 DOB: [birth year]  Age: 75 y.o. MRN#: [id number] Attending Physician: Kathie Dike, MD Primary Care Physician: [name], MD Admit Date: 05/08/2016  Reason for Consultation/Follow-up: Establishing goals of care and Psychosocial/spiritual support  Subjective: [name] sitting up in bed with her friend at bedside. She spontaneously makes eye contact and tries to speak to me around the ET tube. Also at  bedside today is her husband. He shares that their daughter came in for [place] and was able to see [name] last night. We talk about [name] increased blood pressure 201/130, and the difficulty in getting medications that reduce blood pressure also reduce her heart rate. We talk about the ventilator and weaning with the goal of removing ventilator support. We also talk about hemodialysis that is scheduled for later today. Mr. Storti states understanding of the plan. He is still of the mind to continue with all treatment options regardless of outcome.   Length of Stay: 4  Current Medications: Scheduled Meds:  . amiodarone  200 mg Oral Daily  . aspirin EC  81 mg Oral Daily  . chlorhexidine gluconate (MEDLINE KIT)  15 mL Mouth Rinse BID  . chlorhexidine gluconate (MEDLINE KIT)  15 mL Mouth Rinse BID  . famotidine (PEPCID) IV  20 mg Intravenous Q24H  . heparin  5,000 Units Subcutaneous Q8H  . mouth rinse  15 mL Mouth Rinse QID  . mouth rinse  15 mL Mouth Rinse QID  . multivitamin  1 tablet Oral Daily  . NIFEdipine  30 mg Oral Daily    Continuous Infusions: . nitroGLYCERIN 75 mcg/min (05/13/16 1139)    PRN Meds: sodium chloride, sodium chloride, sodium chloride, sodium chloride, acetaminophen,  acetaminophen, ALPRAZolam, fentaNYL (SUBLIMAZE) injection, fentaNYL (SUBLIMAZE) injection, fluticasone, ipratropium-albuterol, lidocaine (PF), lidocaine-prilocaine, midazolam, ondansetron (ZOFRAN) IV, pentafluoroprop-tetrafluoroeth  Physical Exam  Constitutional: No distress.  Calm, intubated/ventilated  HENT:  Head: Normocephalic and atraumatic.  Cardiovascular: Normal rate and regular rhythm.   Periods of Loletha Grayer, periods of severe hypertension 210/130  Pulmonary/Chest: No respiratory distress.  Intubated/ventilated  Abdominal: Soft. She exhibits no distension.  Neurological:  Spontaneously opens eyes, makes and keeps eye contact, tries to speak around the ET tube  Skin: Skin is warm and dry.  Nursing note and vitals reviewed.           Vital Signs: BP (!) 167/84   Pulse 93   Temp 98.5 F (36.9 C)   Resp 19   Ht 5' 1" (1.549 m)   Wt 59.3 kg (130 lb 11.7 oz)   SpO2 100%   BMI 24.70 kg/m  SpO2: SpO2: 100 % O2 Device: O2 Device: Ventilator O2 Flow Rate: O2 Flow Rate (L/min): 2 L/min  Intake/output summary:  Intake/Output Summary (Last 24 hours) at 05/13/16 1320 Last data filed at 05/13/16 0600  Gross per 24 hour  Intake           262.05 ml  Output             -308 ml  Net           570.05 ml   LBM: Last BM Date: 05/08/16 Baseline Weight: Weight: 60.3 kg (133 lb) Most recent weight: Weight: 59.3 kg (130 lb 11.7 oz)       Palliative Assessment/Data:    Flowsheet Rows   Flowsheet Row Most Recent Value  Intake Tab  Referral Department  Hospitalist  Unit at Time of Referral  ICU  Palliative Care Primary Diagnosis  Cardiac  Date Notified  05/12/16  Palliative Care Type  New Palliative care  Reason for referral  Clarify Goals of Care, Advance Care Planning  Date of Admission  05/08/16  Date first seen by Palliative Care  05/12/16  # of days Palliative referral response time  0 Day(s)  # of days IP prior to Palliative referral  4  Clinical Assessment  Palliative  Performance Scale Score  10%  Pain Max last 24 hours  Not able to report  Pain Min Last 24 hours  Not able to report  Dyspnea Max Last 24 Hours  Not able to report  Dyspnea Min Last 24 hours  Not able to report  Psychosocial & Spiritual Assessment  Palliative Care Outcomes  Patient/Family meeting held?  Yes  Who was at the meeting?  Husband  Palliative Care Outcomes  Clarified goals of care, Provided psychosocial or spiritual support  Palliative Care follow-up planned  -- [Follow-up while at [facility]]      Patient Active Problem List   Diagnosis Date Noted  . Respiratory failure ([place])   . Cardiac arrest ([place])   . Palliative care encounter   . DNR (do not resuscitate) discussion   . Goals of care, counseling/discussion   . Hypertensive crisis without congestive heart failure 05/09/2016  . Acute pulmonary edema ([place]) 04/06/2016  . Acute respiratory failure ([place]) 04/06/2016  . Hypertensive crisis 01/27/2016  . History of colon cancer 01/27/2016  . History of ovarian cancer 01/27/2016  . Hypertensive urgency 01/27/2016  . Narrow complex tachycardia ([place]) 09/08/2015  . SVT (supraventricular tachycardia) ([place]) 09/08/2015  . Influenza A 08/30/2015  . Acute on chronic diastolic CHF (congestive heart failure) ([place]) 05/04/2015  . Unstable angina ([place]) 05/03/2015  . Essential hypertension   . Pain in joint, lower leg 08/14/2014  . Chest pain 11/26/2013  . Small bowel obstruction, partial 05/29/2013  . Chronic diastolic CHF (congestive heart failure) ([place]) 03/22/2013  . GI bleeding 03/21/2013  . Acute blood loss anemia 03/21/2013  . Vaginal odor 03/12/2013  . Vaginal discharge 03/12/2013  . Occlusion and stenosis of carotid artery without mention of cerebral infarction 01/24/2013  . Hx of CABG 07/05/2012  . Carotid artery disease ([place]) 07/05/2012  . Anemia of chronic kidney failure  07/03/2012  . Secondary hyperparathyroidism ([place]) 07/03/2012  . Mitral regurgitation 06/12/2012  .  Pneumonia 06/09/2012  . Non-STEMI (non-ST elevated myocardial infarction) ([place]) 06/08/2012  . GERD (gastroesophageal reflux disease) 01/09/2012  . HLD (hyperlipidemia) 01/05/2012  . Coronary atherosclerosis of native coronary artery 12/16/2011  . ESRD on hemodialysis [facility]) 12/16/2011    Palliative Care Assessment & Plan   Patient Profile: 75 y.o. female  with past medical history of end-stage renal disease on dialysis for 5 years, Severe hypertension, high cholesterol, anxiety, CAD, chronic diastolic heart failure, [place] cancer in 1992 admitted on 05/08/2016 with hypertensive crisis.   Assessment: No symptom management needed at this time.  Recommendations/Plan: Mr. Sagona requesting all measures be performed at this time including continued CPR. He states he must feel like he has tried everything. Mr. Wheller states that Mrs. Egley has been intubated twice in the past.  Goals of Care and Additional Recommendations:  Limitations on Scope of Treatment: Full Scope Treatment  Code Status:    Code Status Orders        Start     Ordered   05/09/16 0136  Full code  Continuous     05/09/16 0138    Code Status History    Date Active Date Inactive Code Status Order ID Comments User Context   04/06/2016  5:10 AM 04/08/2016  9:06 PM Full Code [id number]  Oswald Hillock, MD Inpatient   01/27/2016 11:13 PM 01/28/2016 10:45 PM Full Code [id number]  Roney Jaffe, MD Inpatient   09/08/2015  6:06 PM 09/10/2015  7:39 PM Full Code [id number]  Erline Hau, MD Inpatient   08/30/2015  5:56 PM 09/03/2015  7:38 PM Full Code [id number]  Samuella Cota, MD Inpatient   05/03/2015  6:09 PM 05/05/2015  4:01 PM Full Code [id number]  Doree Albee, MD ED   01/21/2015  8:14 PM 01/27/2015  9:00 PM Full Code [id number]  Germain Osgood, PA-C Inpatient   09/30/2014  3:22 AM 10/02/2014  2:35 PM Full Code [id number]  Deneise Lever, MD Inpatient   08/24/2014  9:47 AM 08/25/2014  6:19 PM Full Code [id number]   Erline Hau, MD Inpatient   12/10/2013  3:08 PM 12/10/2013 10:03 PM Full Code [id number]  [name], MD Inpatient   11/26/2013  3:48 PM 11/28/2013  9:45 PM Full Code [id number]  Elmarie Shiley, MD ED   05/29/2013 11:08 AM 06/01/2013  4:34 PM Full Code [id number]  Charlann Lange, MD ED   03/21/2013 12:57 PM 03/27/2013  8:09 PM Full Code [id number]  Kelvin Cellar, MD Inpatient   09/05/2012 11:23 PM 09/06/2012  4:51 PM Full Code [id number]  Charlann Lange, MD Inpatient   06/17/2012  9:32 AM 06/20/2012  7:08 PM Full Code [id number]  Grace Isaac, MD Inpatient   06/13/2012  2:25 PM 06/17/2012  9:32 AM Full Code [id number]  Lucy Chris, RN Inpatient       Prognosis:   < 3 months, < 6 weeks, likely based on 2 codes in 2 days, ESRD on HD, functional decline, although she does seem somewhat improved today, she is still critically ill.  Discharge Planning:  To be determined, based on outcomes. Likely will have need for SNF for rehab at the least.  Care plan was discussed with nursing staff, case manager, social worker, and Dr. Roderic Palau.  Thank you for allowing the Palliative Medicine Team to assist in the care of this patient.   Time In: 0940  Time Out: 1020  Total Time 30 minutes  Prolonged Time Billed  no       Greater than 50%  of this time was spent counseling and coordinating care related to the above assessment and plan.  Drue Novel, NP  Please contact Palliative Medicine Team phone at [phone] for questions and concerns.

## 2016-05-13 NOTE — Progress Notes (Signed)
Nutrition Follow-up  INTERVENTION:  If pt is unable to wean as expected:  Initiate Vital AF @ 55 ml/hr (1320 ml/ day) via OGT.  Tube feeding regimen provides 1584 kcal (100% of needs), 99 grams of protein, and 1070 ml of H2O.   Recommend consider bowel regimen: NO BM x 4 days    NUTRITION DIAGNOSIS:  Increased protein/energy needs related to ESRD with HD as evidenced by est needs  GOAL:  Pt to meet >/= 90% of their estimated nutrition needs     MONITOR:  Po intake, labs and wt trends     REASON FOR ASSESSMENT:   Ventilator    ASSESSMENT:  [name] who presents with chest pain, nausea and dyspnea. She has hx of ESRD with HD (5 yrs per pt), CAD, GERD, CHF, HTN.  Shawna [name] is currently intubated on ventilator support after becoming bradycardic and non-responsive. She has an OGT (16 fr) placed. She is awake and per MD note she may be able to wean today? Talked with her respiratory therapist: pt has been in the process of weaning since 11:15 am but is having difficulty staying awake long enough to meet necessary weaning parameters.  MV: 6.1 L/min Temp (24hrs), Avg:98.5 F (36.9 C), Min:97 F (36.1 C), Max:99.1 F (37.3 C)   Recent Labs Lab 05/11/16 0631 05/11/16 0817 05/11/16 1410 05/12/16 0359 05/12/16 1404 05/13/16 0430  NA 131* 133* 137 135 139 138  K 6.4* 6.3* 4.0 5.6* 5.3* 4.4  CL 92* 92* 96* 95* 95* 96*  CO2 26 17* 27 26 28 27   BUN 47* 50* 19 38* 49* 47*  CREATININE 8.58* 9.09* 4.08* 6.35* 7.49* 6.74*  CALCIUM 7.9* 10.5* 9.2 8.6* 11.0* 8.4*  MG  --  2.0  --   --   --   --   PHOS 7.0*  --  4.2  --   --  6.1*  GLUCOSE 89 235* 100* 90 162* 127*     Labs: BUN 47, Cr 6.74, Phos 6.1, glucose 127  Meds/ vitamins: Fosrenol, Rena-Vite, PPI   Diet Order:  Diet NPO time specified  Skin:  Reviewed, no issues  Last BM:  10/29  Height:   Ht Readings from Last 1 Encounters:  05/11/16 5\' 1"  (1.549 m)    Weight:   Wt Readings from Last 1  Encounters:  05/13/16 130 lb 11.7 oz (59.3 kg)    Ideal Body Weight:  48 kg  BMI:  Body mass index is 24.7 kg/m.  Re-estimated Nutritional Needs:   Kcal:  1856-3149  Protein:  88-95 gr  Fluid:  1.2 liters daily   EDUCATION NEEDS:  None at this time   Shawna [name] Shawna,RD,CSG,LDN Office: #[phone] Pager: [phone]

## 2016-05-13 NOTE — Progress Notes (Signed)
Subjective: Interval History: Patient remained intubated.  Objective: Vital signs in last 24 hours: Temp:  [97 F (36.1 C)-99.1 F (37.3 C)] 97 F (36.1 C) (11/03 0400) Pulse Rate:  [79-103] 81 (11/03 0730) Resp:  [11-38] 12 (11/03 0730) BP: (70-213)/(40-94) 172/82 (11/03 0730) SpO2:  [100 %] 100 % (11/03 0730) FiO2 (%):  [40 %] 40 % (11/03 0311) Weight:  [59.3 kg (130 lb 11.7 oz)-60.3 kg (132 lb 15 oz)] 59.3 kg (130 lb 11.7 oz) (11/03 0455) Weight change: 0.4 kg (14.1 oz)  Intake/Output from previous day: 11/02 0701 - 11/03 0700 In: 270.2 [I.V.:270.2] Out: 292 [Emesis/NG output:1000] Intake/Output this shift: No intake/output data recorded.  Patient is presently alert but remained intubated. Chest: Inspiratory crackles Heart: RRR  Extremties: No edema  Lab Results:  Recent Labs  05/12/16 1404 05/13/16 0430  WBC 18.0* 10.8*  HGB 10.2* 9.9*  HCT 33.0* 31.3*  PLT 253 218   BMET:   Recent Labs  05/12/16 1404 05/13/16 0430  NA 139 138  K 5.3* 4.4  CL 95* 96*  CO2 28 27  GLUCOSE 162* 127*  BUN 49* 47*  CREATININE 7.49* 6.74*  CALCIUM 11.0* 8.4*   No results for input(s): PTH in the last 72 hours. Iron Studies: No results for input(s): IRON, TIBC, TRANSFERRIN, FERRITIN in the last 72 hours.  Studies/Results: Dg Chest Port 1 View  Result Date: 05/13/2016 CLINICAL DATA:  Respiratory failure. EXAM: PORTABLE CHEST 1 VIEW COMPARISON:  05/12/2016 . FINDINGS: Endotracheal tube, feeding tube, right IJ line in stable position. Prior CABG. Heart size normal. Low lung volumes with mild basilar atelectasis. No prominent pleural effusion or pneumothorax . IMPRESSION: 1. Lines and tubes in stable position. 2. Prior CABG.  Heart size normal. 3. Low lung volumes with mild basilar atelectasis. Electronically Signed   By: Marcello Moores  Register   On: 05/13/2016 07:33   Portable Chest Xray  Result Date: 05/12/2016 CLINICAL DATA:  Intubation. EXAM: PORTABLE CHEST 1 VIEW COMPARISON:   05/11/2016. FINDINGS: Endotracheal tube, NG tube, right IJ line in stable position. Prior CABG. Normal pulmonary vascularity. Low lung volumes. Small bilateral pleural effusions again cannot be excluded. IMPRESSION: 1. Lines and tubes in stable position. 2. Prior CABG.  Heart size stable. 3. Low lung volumes with bibasilar atelectasis again noted. Small bilateral pleural effusions again cannot be excluded. Chest is unchanged from prior exam. Electronically Signed   By: Marcello Moores  Register   On: 05/12/2016 07:38   Dg Chest Port 1 View  Result Date: 05/11/2016 CLINICAL DATA:  PICC line placement EXAM: PORTABLE CHEST 1 VIEW COMPARISON:  05/11/2016 FINDINGS: Internal jugular PICC line has been placed. The tip is in the SVC. No pneumothorax. Prior CABG. Heart is borderline in size. There is bibasilar atelectasis. Suspect small layering effusions. Endotracheal tube is 3.4 cm above the carina. NG tube enters the stomach. IMPRESSION: Right internal jugular PICC line tip is in the SVC. No pneumothorax. Bibasilar atelectasis.  Suspect layering effusions. Electronically Signed   By: Rolm Baptise M.D.   On: 05/11/2016 15:16    I have reviewed the patient's current medications.  Assessment/Plan: Problem #1 patient with episode of bradycardia/unresponsive/status post CODE BLUE and intubation the day before yesterday yesterday. She had another episode yesterday before dialysis. Presently patient is more awake but remains intubated. Mainly secondary to severe bradycardia. Problem #2 hypertension: Her blood pressure at this moment seems to be still high. Problem #3 end-stage renal disease: She is status post hemodialysis the last 2 days.  Her potassium is presently normal. Problem #5 anemia: Her hemoglobin is below our target goal. Presently she is on Epogen. Problem #6 fluid management: Patient status post hemodialysis yesterday presently she doesn't have any significant sign of fluid overload. Problem #7 metabolic bone  disease: Her calcium is normal at the phosphorus is high. Her binder was discontinued because of hypophosphatemia. Plan: 1]Will dialyze patient tomorrow for 4 hours and used 2K/2.5 calcium bath 2] will check renal panel in the morning 3] will remove 2 L if her systolic blood pressure remains stable 4] will continue with Epogen with dialysis.   LOS: 4 days   S[place]y Rolison S 05/13/2016,8:48 AM  Called after patient became bradycardic after receiving iv Labetalol for hypertenion before dialysis. Code blue was called and patient given epinephrine with some recovery. Presently on dialysis with out fluid removal to help correct her potassium. Discussed with her husband about her condition and wants to continue with full code even though this is second episode since her admission. Patient has episode of passing out on dialysis mainly from her sbp < 120 but not from bradycardia. Her husband thinks she will recover from this even though the cause is some what different.

## 2016-05-13 NOTE — Plan of Care (Signed)
Problem: Activity: Goal: Ability to tolerate increased activity will improve Outcome: Progressing Patient [place] remained at 30-45 degrees. PROM performed on all extremities.

## 2016-05-13 NOTE — Progress Notes (Signed)
PROGRESS NOTE    Shawna [name]  YEB:[id number] DOB: 1941-06-26 DOA: 05/08/2016 PCP: [name], MD    Brief Narrative:  24 yof with a history of CAD and ESRD, presented with acute onset of chest pain unrelieved by aspirin and nitroglycerin. In the emergency department BP 230/130 and she was noted to have new ST depression. She was started on nitroglycerin infusion and IV labetalol and admitted for hypertensive crisis with chest pain.Her chest pain had resolved but she remained hypertensive. On 11/1 patient suddenly became bradycardic and unresponsive. She underwent CPR, chest compressions, received epinephrine and had ROSC. She is currently on ventilatory support.   Assessment & Plan:   Principal Problem:   Hypertensive crisis Active Problems:   Coronary atherosclerosis of native coronary artery   ESRD on hemodialysis (HCC)   Anemia of chronic kidney failure   Chronic diastolic CHF (congestive heart failure) (HCC)   Chest pain   Acute respiratory failure ([place])   Hypertensive crisis without congestive heart failure   Cardiac arrest [facility])   Palliative care encounter   DNR (do not resuscitate) discussion   Goals of care, counseling/discussion  1. Bradycardia with PEA cardiac arrest. On 11/1, patient's heart rate became bradycardic into PEA. She underwent CPR, received epinephrine, bicarbonate and calcium chloride. She had ROSC after approximately 25 mins of CPR. Lab work had indicated an elevated potassium of 6.4, which may have been cause. She was briefly started on a epinephrine infusion which was quickly weaned off. CXR does not show any acute process. Cardiology following. On 11/2, prior to starting dialysis, patient was noted to be hypertensive, despite being on nitroglycerin infusion. She received one dose of IV labetalol and subsequently developed bradycardia and went into PEA arrest. She received epinephrine, bicarbonate, and calcium chloride, as well as CPR for < 20mns and had  ROSC. Currently, heart rate has been stable. ? Need for pacemaker. Would defer to cardiology  2. Acute respiratory failure. Ventilator dependent. Patient intubated while undergoing CPR. Pulmonology following. She is undergoing daily weaning trials. 3. Hypertensive crisis with chest pain. Patient was admitted with a blood pressure of 230/130 and chest pain. She was started on nitroglycerin and IV labetalol which were weaned off. Her chest pain had resolved. At this time, her blood pressure has started to rise again. She is currently on nitro infusion.  4. Elevated troponin. Patient has a history of CAD status post CABG and stenting. Patient had no evidence of ACS on admission. Since that time, her troponin has started to rise. Cardiology following and felt that this may be demand ischemia/related to CPR. Troponin peak was 1.5 and has since trended down.  5. ESRD on HD MWF. Nephrology following. She underwent dialysis yesterday with improvement of hyperkalemia.  6. Chronic diastolic congestive heart failure. Patient has an echocardiogram on 09/2015 with an EF of 683-72% grade 2 diastolic dysfunction. Continue fluid management with dialysis 7. PSVT, maintained on amiodarone. EKG remains in sinus rhythm.  8. Myoclonus. Suspect secondary to CKD/ESRD on HD. No history or objective findings to suggest acute CNS process. Reviewed meds, no likely offenders. Monitor for now. 9. Hyperkalemia. Resolved.  10. Elevated TSH. Recheck as an outpatient. 11. Anemia of chronic kidney disease. Hemoglobin has trended down to 9.9. There are no signs of active bleeding. Will continue to monitor and recheck labs in the AM.   DVT prophylaxis: Heparin injection  Code Status: FULL  Family Communication: Husband at bedside Disposition Plan: Discharge home once improved.  Consultants:   Nephrology   Cardiology   Pulmonology   Nutrition   Procedures:  None   Antimicrobials:  None    Subjective: Intubated.  Able to follow commands  Objective: Vitals:   05/13/16 0000 05/13/16 0311 05/13/16 0400 05/13/16 0455  BP:      Pulse:  82    Resp:  14    Temp: 98.9 F (37.2 C)  97 F (36.1 C)   TempSrc: Axillary  Axillary   SpO2:  100%    Weight:    59.3 kg (130 lb 11.7 oz)  Height:        Intake/Output Summary (Last 24 hours) at 05/13/16 0625 Last data filed at 05/12/16 1745  Gross per 24 hour  Intake              8.1 ml  Output             -108 ml  Net            116.1 ml   Filed Weights   05/12/16 0500 05/12/16 1340 05/13/16 0455  Weight: 60 kg (132 lb 4.4 oz) 60.3 kg (132 lb 15 oz) 59.3 kg (130 lb 11.7 oz)    Examination:  General exam: Appears calm and comfortable  Respiratory system: Clear to auscultation. Respiratory effort normal. Cardiovascular system: S1 & S2 heard, RRR. No JVD, murmurs, rubs, gallops or clicks. No pedal edema. Gastrointestinal system: Abdomen is nondistended, soft and nontender. No organomegaly or masses felt. Normal bowel sounds heard. Central nervous system: No focal neurological deficits. Extremities: Symmetric 5 x 5 power. Skin: No rashes, lesions or ulcers Psychiatry: cannot assess.     Data Reviewed: I have personally reviewed following labs and imaging studies  CBC:  Recent Labs Lab 05/08/16 2326 05/09/16 1430 05/11/16 0817 05/12/16 0359 05/12/16 1404 05/13/16 0430  WBC 7.3 6.2 8.8 10.1 18.0* 10.8*  NEUTROABS 5.1  --   --   --   --   --   HGB 10.5* 9.0* 9.7* 10.8* 10.2* 9.9*  HCT 34.0* 29.4* 31.8* 34.9* 33.0* 31.3*  MCV 101.8* 101.7* 102.9* 100.0 100.6* 99.1  PLT 307 254 263 260 253 008   Basic Metabolic Panel:  Recent Labs Lab 05/09/16 1430 05/11/16 0631 05/11/16 0817 05/11/16 1410 05/12/16 0359 05/12/16 1404 05/13/16 0430  NA 135  135 131* 133* 137 135 139 138  K 3.9  3.9 6.4* 6.3* 4.0 5.6* 5.3* 4.4  CL 99*  99* 92* 92* 96* 95* 95* 96*  CO2 _0 17* _1 GLUCOSE 112*  111* 89 235* 100* 90 162* 127*    BUN 43*  42* 47* 50* 19 38* 49* 47*  CREATININE 7.39*  7.56* 8.58* 9.09* 4.08* 6.35* 7.49* 6.74*  CALCIUM 7.9*  7.9* 7.9* 10.5* 9.2 8.6* 11.0* 8.4*  MG  --   --  2.0  --   --   --   --   PHOS 1.3* 7.0*  --  4.2  --   --  6.1*   GFR: Estimated Creatinine Clearance: 5.9 mL/min (by C-G formula based on SCr of 6.74 mg/dL (H)). Liver Function Tests:  Recent Labs Lab 05/08/16 2326  05/11/16 0631 05/11/16 0817 05/11/16 1410 05/12/16 0359 05/13/16 0430  AST 18  --   --  711*  --  421*  --   ALT 13*  --   --  629*  --  529*  --   ALKPHOS 79  --   --  76  --  92  --   BILITOT 0.7  --   --  0.6  --  1.1  --   PROT 6.9  --   --  5.4*  --  6.5  --   ALBUMIN 3.7  < > 3.1* 2.9* 3.6 3.4* 3.1*  < > = values in this interval not displayed. No results for input(s): LIPASE, AMYLASE in the last 168 hours. No results for input(s): AMMONIA in the last 168 hours. Coagulation Profile:  Recent Labs Lab 05/08/16 2351  INR 0.92   Cardiac Enzymes:  Recent Labs Lab 05/09/16 1430 05/11/16 0817 05/11/16 1410 05/11/16 2010 05/12/16 0948  TROPONINI 0.05* 0.05* 1.27* 1.53* 0.66*   BNP (last 3 results) No results for input(s): PROBNP in the last 8760 hours. HbA1C: No results for input(s): HGBA1C in the last 72 hours. CBG:  Recent Labs Lab 05/09/16 2029 05/11/16 0735  GLUCAP 160* 77   Lipid Profile: No results for input(s): CHOL, HDL, LDLCALC, TRIG, CHOLHDL, LDLDIRECT in the last 72 hours. Thyroid Function Tests: No results for input(s): TSH, T4TOTAL, FREET4, T3FREE, THYROIDAB in the last 72 hours. Anemia Panel: No results for input(s): VITAMINB12, FOLATE, FERRITIN, TIBC, IRON, RETICCTPCT in the last 72 hours. Sepsis Labs: No results for input(s): PROCALCITON, LATICACIDVEN in the last 168 hours.  Recent Results (from the past 240 hour(s))  MRSA PCR Screening     Status: None   Collection Time: 05/09/16  2:38 AM  Result Value Ref Range Status   MRSA by PCR NEGATIVE NEGATIVE Final     Comment:        The GeneXpert MRSA Assay (FDA approved for NASAL specimens only), is one component of a comprehensive MRSA colonization surveillance program. It is not intended to diagnose MRSA infection nor to guide or monitor treatment for MRSA infections.          Radiology Studies: Portable Chest Xray  Result Date: 05/12/2016 CLINICAL DATA:  Intubation. EXAM: PORTABLE CHEST 1 VIEW COMPARISON:  05/11/2016. FINDINGS: Endotracheal tube, NG tube, right IJ line in stable position. Prior CABG. Normal pulmonary vascularity. Low lung volumes. Small bilateral pleural effusions again cannot be excluded. IMPRESSION: 1. Lines and tubes in stable position. 2. Prior CABG.  Heart size stable. 3. Low lung volumes with bibasilar atelectasis again noted. Small bilateral pleural effusions again cannot be excluded. Chest is unchanged from prior exam. Electronically Signed   By: Marcello Moores  Register   On: 05/12/2016 07:38   Dg Chest Port 1 View  Result Date: 05/11/2016 CLINICAL DATA:  PICC line placement EXAM: PORTABLE CHEST 1 VIEW COMPARISON:  05/11/2016 FINDINGS: Internal jugular PICC line has been placed. The tip is in the SVC. No pneumothorax. Prior CABG. Heart is borderline in size. There is bibasilar atelectasis. Suspect small layering effusions. Endotracheal tube is 3.4 cm above the carina. NG tube enters the stomach. IMPRESSION: Right internal jugular PICC line tip is in the SVC. No pneumothorax. Bibasilar atelectasis.  Suspect layering effusions. Electronically Signed   By: Rolm Baptise M.D.   On: 05/11/2016 15:16   Dg Chest Port 1 View  Result Date: 05/11/2016 CLINICAL DATA:  Intubated, post resuscitation EXAM: PORTABLE CHEST 1 VIEW COMPARISON:  05/08/2016 FINDINGS: Borderline cardiomegaly. Status post CABG. Endotracheal tube at the level of carina. Endotracheal tube should be retracted about 1 cm. No pneumothorax. Mild basilar atelectasis. No pulmonary edema. IMPRESSION: Endotracheal tube at  the level of carina. Endotracheal tube should be retracted about 1 cm. No pneumothorax. No pulmonary  edema. Electronically Signed   By: Lahoma Crocker M.D.   On: 05/11/2016 08:32        Scheduled Meds: . amiodarone  200 mg Oral Daily  . aspirin EC  81 mg Oral Daily  . chlorhexidine gluconate (MEDLINE KIT)  15 mL Mouth Rinse BID  . chlorhexidine gluconate (MEDLINE KIT)  15 mL Mouth Rinse BID  . famotidine (PEPCID) IV  20 mg Intravenous Q24H  . heparin  5,000 Units Subcutaneous Q8H  . mouth rinse  15 mL Mouth Rinse QID  . mouth rinse  15 mL Mouth Rinse QID  . multivitamin  1 tablet Oral Daily  . NIFEdipine  30 mg Oral Daily   Continuous Infusions: . nitroGLYCERIN 45 mcg/min (05/13/16 0130)     LOS: 4 days    Time spent: 25 minutes     Kathie Dike, MD [facility] If 7PM-7AM, please contact night-coverage [email or web address] Password [facility] 05/13/2016, 6:25 AM

## 2016-05-13 NOTE — Progress Notes (Addendum)
Subjective: She is awake and alert on the ventilator. She is able to respond to questions. Intubated still on some sedation. She denies chest pain except in her rib area. Breathing is okay. No nausea vomiting or diarrhea. She had another code blue yesterday.  Objective: Vital signs in last 24 hours: Temp:  [97 F (36.1 C)-99.1 F (37.3 C)] 97 F (36.1 C) (11/03 0400) Pulse Rate:  [79-103] 81 (11/03 0730) Resp:  [11-38] 12 (11/03 0730) BP: (70-213)/(40-94) 172/82 (11/03 0730) SpO2:  [100 %] 100 % (11/03 0730) FiO2 (%):  [40 %] 40 % (11/03 0311) Weight:  [59.3 kg (130 lb 11.7 oz)-60.3 kg (132 lb 15 oz)] 59.3 kg (130 lb 11.7 oz) (11/03 0455) Weight change: 0.4 kg (14.1 oz) Last BM Date: 05/08/16  Intake/Output from previous day: 11/02 0701 - 11/03 0700 In: 270.2 [I.V.:270.2] Out: 292 [Emesis/NG output:1000]  PHYSICAL EXAM General appearance: alert and Intubated on mechanical ventilation Resp: rhonchi bilaterally Cardio: regular rate and rhythm, S1, S2 normal, no murmur, click, rub or gallop GI: soft, non-tender; bowel sounds normal; no masses,  no organomegaly Extremities: extremities normal, atraumatic, no cyanosis or edema Pupils are reactive. EOMI. Mucous membranes are moist  Lab Results:  Results for orders placed or performed during the hospital encounter of 05/08/16 (from the past 48 hour(s))  Troponin I (q 6hr x 3)     Status: Abnormal   Collection Time: 05/11/16  8:17 AM  Result Value Ref Range   Troponin I 0.05 (HH) <0.03 ng/mL    Comment: CRITICAL VALUE NOTED.  VALUE IS CONSISTENT WITH PREVIOUSLY REPORTED AND CALLED VALUE.  CBC     Status: Abnormal   Collection Time: 05/11/16  8:17 AM  Result Value Ref Range   WBC 8.8 4.0 - 10.5 K/uL   RBC 3.09 ([name]) 3.87 - 5.11 MIL/uL   Hemoglobin 9.7 ([name]) 12.0 - 15.0 g/dL   HCT 31.8 ([name]) 36.0 - 46.0 %   MCV 102.9 (H) 78.0 - 100.0 fL   MCH 31.4 26.0 - 34.0 pg   MCHC 30.5 30.0 - 36.0 g/dL   RDW 17.3 (H) 11.5 - 15.5 %   Platelets  263 150 - 400 K/uL  Comprehensive metabolic panel     Status: Abnormal   Collection Time: 05/11/16  8:17 AM  Result Value Ref Range   Sodium 133 ([name]) 135 - 145 mmol/[name]   Potassium 6.3 (HH) 3.5 - 5.1 mmol/[name]    Comment: CRITICAL RESULT CALLED TO, READ BACK BY AND VERIFIED WITH: SHONEWITZ,[name] AT 9:05AM ON 05/11/16 BY FESTERMAN,C    Chloride 92 ([name]) 101 - 111 mmol/[name]   CO2 17 ([name]) 22 - 32 mmol/[name]   Glucose, Bld 235 (H) 65 - 99 mg/dL   BUN 50 (H) 6 - 20 mg/dL   Creatinine, Ser 9.09 (H) 0.44 - 1.00 mg/dL   Calcium 10.5 (H) 8.9 - 10.3 mg/dL    Comment: DELTA CHECK NOTED   Total Protein 5.4 ([name]) 6.5 - 8.1 g/dL   Albumin 2.9 ([name]) 3.5 - 5.0 g/dL   AST 711 (H) 15 - 41 U/[name]   ALT 629 (H) 14 - 54 U/[name]   Alkaline Phosphatase 76 38 - 126 U/[name]   Total Bilirubin 0.6 0.3 - 1.2 mg/dL   GFR calc non Af Amer 4 ([name]) >60 mL/min   GFR calc Af Amer 4 ([name]) >60 mL/min    Comment: (NOTE) The eGFR has been calculated using the CKD EPI equation. This calculation has not been validated in  all clinical situations. eGFR's persistently <60 mL/min signify possible Chronic Kidney Disease.   Magnesium     Status: None   Collection Time: 05/11/16  8:17 AM  Result Value Ref Range   Magnesium 2.0 1.7 - 2.4 mg/dL  Draw ABG 1 hour after initiation of ventilator     Status: Abnormal   Collection Time: 05/11/16 11:05 AM  Result Value Ref Range   FIO2 100.00    O2 Content 100.0 [name]/min   Delivery systems VENTILATOR    Mode PRESSURE REGULATED VOLUME CONTROL    VT 450 mL   LHR 18 resp/min   Peep/cpap 5.0 cm H20   pH, Arterial 7.566 (H) 7.350 - 7.450   pCO2 arterial 30.5 ([name]) 32.0 - 48.0 mmHg   pO2, Arterial 433 (H) 83.0 - 108.0 mmHg   Bicarbonate 29.8 (H) 20.0 - 28.0 mmol/[name]   Acid-Base Excess 5.2 (H) 0.0 - 2.0 mmol/[name]   O2 Saturation 97.9 %   Collection site RIGHT RADIAL    Drawn by [phone]    Sample type ARTERIAL    Allens test (pass/fail) PASS PASS  Troponin I (q 6hr x 3)     Status: Abnormal   Collection Time: 05/11/16  2:10  PM  Result Value Ref Range   Troponin I 1.27 (HH) <0.03 ng/mL    Comment: CRITICAL VALUE NOTED.  VALUE IS CONSISTENT WITH PREVIOUSLY REPORTED AND CALLED VALUE.  Renal function panel     Status: Abnormal   Collection Time: 05/11/16  2:10 PM  Result Value Ref Range   Sodium 137 135 - 145 mmol/[name]   Potassium 4.0 3.5 - 5.1 mmol/[name]    Comment: DELTA CHECK NOTED   Chloride 96 ([name]) 101 - 111 mmol/[name]   CO2 27 22 - 32 mmol/[name]   Glucose, Bld 100 (H) 65 - 99 mg/dL   BUN 19 6 - 20 mg/dL   Creatinine, Ser 4.08 (H) 0.44 - 1.00 mg/dL    Comment: DELTA CHECK NOTED   Calcium 9.2 8.9 - 10.3 mg/dL   Phosphorus 4.2 2.5 - 4.6 mg/dL   Albumin 3.6 3.5 - 5.0 g/dL   GFR calc non Af Amer 10 ([name]) >60 mL/min   GFR calc Af Amer 11 ([name]) >60 mL/min    Comment: (NOTE) The eGFR has been calculated using the CKD EPI equation. This calculation has not been validated in all clinical situations. eGFR's persistently <60 mL/min signify possible Chronic Kidney Disease.    Anion gap 14 5 - 15  Troponin I (q 6hr x 3)     Status: Abnormal   Collection Time: 05/11/16  8:10 PM  Result Value Ref Range   Troponin I 1.53 (HH) <0.03 ng/mL    Comment: CRITICAL RESULT CALLED TO, READ BACK BY AND VERIFIED WITH: SMITH,J ON 05/11/16 AT 2105 BY LOY,C   CBC     Status: Abnormal   Collection Time: 05/12/16  3:59 AM  Result Value Ref Range   WBC 10.1 4.0 - 10.5 K/uL   RBC 3.49 ([name]) 3.87 - 5.11 MIL/uL   Hemoglobin 10.8 ([name]) 12.0 - 15.0 g/dL   HCT 34.9 ([name]) 36.0 - 46.0 %   MCV 100.0 78.0 - 100.0 fL   MCH 30.9 26.0 - 34.0 pg   MCHC 30.9 30.0 - 36.0 g/dL   RDW 17.3 (H) 11.5 - 15.5 %   Platelets 260 150 - 400 K/uL  Comprehensive metabolic panel     Status: Abnormal   Collection Time: 05/12/16  3:59 AM  Result Value Ref Range   Sodium 135 135 - 145 mmol/[name]   Potassium 5.6 (H) 3.5 - 5.1 mmol/[name]    Comment: DELTA CHECK NOTED   Chloride 95 ([name]) 101 - 111 mmol/[name]   CO2 26 22 - 32 mmol/[name]   Glucose, Bld 90 65 - 99 mg/dL   BUN 38 (H) 6 - 20  mg/dL   Creatinine, Ser 6.35 (H) 0.44 - 1.00 mg/dL   Calcium 8.6 ([name]) 8.9 - 10.3 mg/dL   Total Protein 6.5 6.5 - 8.1 g/dL   Albumin 3.4 ([name]) 3.5 - 5.0 g/dL   AST 421 (H) 15 - 41 U/[name]   ALT 529 (H) 14 - 54 U/[name]   Alkaline Phosphatase 92 38 - 126 U/[name]   Total Bilirubin 1.1 0.3 - 1.2 mg/dL   GFR calc non Af Amer 6 ([name]) >60 mL/min   GFR calc Af Amer 7 ([name]) >60 mL/min    Comment: (NOTE) The eGFR has been calculated using the CKD EPI equation. This calculation has not been validated in all clinical situations. eGFR's persistently <60 mL/min signify possible Chronic Kidney Disease.    Anion gap 14 5 - 15  Troponin I     Status: Abnormal   Collection Time: 05/12/16  9:48 AM  Result Value Ref Range   Troponin I 0.66 (HH) <0.03 ng/mL    Comment: CRITICAL VALUE NOTED.  VALUE IS CONSISTENT WITH PREVIOUSLY REPORTED AND CALLED VALUE.  CBC     Status: Abnormal   Collection Time: 05/12/16  2:04 PM  Result Value Ref Range   WBC 18.0 (H) 4.0 - 10.5 K/uL   RBC 3.28 ([name]) 3.87 - 5.11 MIL/uL   Hemoglobin 10.2 ([name]) 12.0 - 15.0 g/dL   HCT 33.0 ([name]) 36.0 - 46.0 %   MCV 100.6 (H) 78.0 - 100.0 fL   MCH 31.1 26.0 - 34.0 pg   MCHC 30.9 30.0 - 36.0 g/dL   RDW 17.2 (H) 11.5 - 15.5 %   Platelets 253 150 - 400 K/uL  Basic metabolic panel     Status: Abnormal   Collection Time: 05/12/16  2:04 PM  Result Value Ref Range   Sodium 139 135 - 145 mmol/[name]   Potassium 5.3 (H) 3.5 - 5.1 mmol/[name]   Chloride 95 ([name]) 101 - 111 mmol/[name]   CO2 28 22 - 32 mmol/[name]   Glucose, Bld 162 (H) 65 - 99 mg/dL   BUN 49 (H) 6 - 20 mg/dL   Creatinine, Ser 7.49 (H) 0.44 - 1.00 mg/dL   Calcium 11.0 (H) 8.9 - 10.3 mg/dL    Comment: DELTA CHECK NOTED   GFR calc non Af Amer 5 ([name]) >60 mL/min   GFR calc Af Amer 5 ([name]) >60 mL/min    Comment: (NOTE) The eGFR has been calculated using the CKD EPI equation. This calculation has not been validated in all clinical situations. eGFR's persistently <60 mL/min signify possible Chronic Kidney Disease.     Anion gap 16 (H) 5 - 15  Renal function panel     Status: Abnormal   Collection Time: 05/13/16  4:30 AM  Result Value Ref Range   Sodium 138 135 - 145 mmol/[name]   Potassium 4.4 3.5 - 5.1 mmol/[name]    Comment: DELTA CHECK NOTED   Chloride 96 ([name]) 101 - 111 mmol/[name]   CO2 27 22 - 32 mmol/[name]   Glucose, Bld 127 (H) 65 - 99 mg/dL   BUN 47 (H) 6 - 20 mg/dL   Creatinine,  Ser 6.74 (H) 0.44 - 1.00 mg/dL   Calcium 8.4 ([name]) 8.9 - 10.3 mg/dL   Phosphorus 6.1 (H) 2.5 - 4.6 mg/dL   Albumin 3.1 ([name]) 3.5 - 5.0 g/dL   GFR calc non Af Amer 5 ([name]) >60 mL/min   GFR calc Af Amer 6 ([name]) >60 mL/min    Comment: (NOTE) The eGFR has been calculated using the CKD EPI equation. This calculation has not been validated in all clinical situations. eGFR's persistently <60 mL/min signify possible Chronic Kidney Disease.    Anion gap 15 5 - 15  CBC     Status: Abnormal   Collection Time: 05/13/16  4:30 AM  Result Value Ref Range   WBC 10.8 (H) 4.0 - 10.5 K/uL   RBC 3.16 ([name]) 3.87 - 5.11 MIL/uL   Hemoglobin 9.9 ([name]) 12.0 - 15.0 g/dL   HCT 31.3 ([name]) 36.0 - 46.0 %   MCV 99.1 78.0 - 100.0 fL   MCH 31.3 26.0 - 34.0 pg   MCHC 31.6 30.0 - 36.0 g/dL   RDW 17.3 (H) 11.5 - 15.5 %   Platelets 218 150 - 400 K/uL  Blood gas, arterial     Status: Abnormal   Collection Time: 05/13/16  6:20 AM  Result Value Ref Range   FIO2 0.40    Delivery systems VENTILATOR    Mode PRESSURE REGULATED VOLUME CONTROL    VT 380 mL   LHR 14 resp/min   Peep/cpap 5.0 cm H20   pH, Arterial 7.466 (H) 7.350 - 7.450   pCO2 arterial 37.5 32.0 - 48.0 mmHg   pO2, Arterial 165 (H) 83.0 - 108.0 mmHg   Bicarbonate 27.3 20.0 - 28.0 mmol/[name]   Acid-Base Excess 3.1 (H) 0.0 - 2.0 mmol/[name]   O2 Saturation 98.6 %   Patient temperature 37.0    Collection site RIGHT RADIAL    Drawn by [phone]    Sample type ARTERIAL DRAW    Allens test (pass/fail) PASS PASS    ABGS  Recent Labs  05/13/16 0620  PHART 7.466*  PO2ART 165*  HCO3 27.3   CULTURES Recent Results  (from the past 240 hour(s))  MRSA PCR Screening     Status: None   Collection Time: 05/09/16  2:38 AM  Result Value Ref Range Status   MRSA by PCR NEGATIVE NEGATIVE Final    Comment:        The GeneXpert MRSA Assay (FDA approved for NASAL specimens only), is one component of a comprehensive MRSA colonization surveillance program. It is not intended to diagnose MRSA infection nor to guide or monitor treatment for MRSA infections.    Studies/Results: Dg Chest Port 1 View  Result Date: 05/13/2016 CLINICAL DATA:  Respiratory failure. EXAM: PORTABLE CHEST 1 VIEW COMPARISON:  05/12/2016 . FINDINGS: Endotracheal tube, feeding tube, right IJ line in stable position. Prior CABG. Heart size normal. Low lung volumes with mild basilar atelectasis. No prominent pleural effusion or pneumothorax . IMPRESSION: 1. Lines and tubes in stable position. 2. Prior CABG.  Heart size normal. 3. Low lung volumes with mild basilar atelectasis. Electronically Signed   By: Marcello Moores  Register   On: 05/13/2016 07:33   Portable Chest Xray  Result Date: 05/12/2016 CLINICAL DATA:  Intubation. EXAM: PORTABLE CHEST 1 VIEW COMPARISON:  05/11/2016. FINDINGS: Endotracheal tube, NG tube, right IJ line in stable position. Prior CABG. Normal pulmonary vascularity. Low lung volumes. Small bilateral pleural effusions again cannot be excluded. IMPRESSION: 1. Lines and tubes in stable position. 2. Prior  CABG.  Heart size stable. 3. Low lung volumes with bibasilar atelectasis again noted. Small bilateral pleural effusions again cannot be excluded. Chest is unchanged from prior exam. Electronically Signed   By: Marcello Moores  Register   On: 05/12/2016 07:38   Dg Chest Port 1 View  Result Date: 05/11/2016 CLINICAL DATA:  PICC line placement EXAM: PORTABLE CHEST 1 VIEW COMPARISON:  05/11/2016 FINDINGS: Internal jugular PICC line has been placed. The tip is in the SVC. No pneumothorax. Prior CABG. Heart is borderline in size. There is bibasilar  atelectasis. Suspect small layering effusions. Endotracheal tube is 3.4 cm above the carina. NG tube enters the stomach. IMPRESSION: Right internal jugular PICC line tip is in the SVC. No pneumothorax. Bibasilar atelectasis.  Suspect layering effusions. Electronically Signed   By: Rolm Baptise M.D.   On: 05/11/2016 15:16   Dg Chest Port 1 View  Result Date: 05/11/2016 CLINICAL DATA:  Intubated, post resuscitation EXAM: PORTABLE CHEST 1 VIEW COMPARISON:  05/08/2016 FINDINGS: Borderline cardiomegaly. Status post CABG. Endotracheal tube at the level of carina. Endotracheal tube should be retracted about 1 cm. No pneumothorax. Mild basilar atelectasis. No pulmonary edema. IMPRESSION: Endotracheal tube at the level of carina. Endotracheal tube should be retracted about 1 cm. No pneumothorax. No pulmonary edema. Electronically Signed   By: Lahoma Crocker M.D.   On: 05/11/2016 08:32    Medications:  Prior to Admission:  Prescriptions Prior to Admission  Medication Sig Dispense Refill Last Dose  . ALPRAZolam (XANAX) 0.25 MG tablet Take 1-2 tablets by mouth at bedtime as needed for sleep.    05/08/2016 at Unknown time  . amiodarone (PACERONE) 200 MG tablet Take 1 tablet (200 mg total) by mouth daily. 30 tablet 3 05/08/2016 at Unknown time  . aspirin EC 81 MG tablet Take 81 mg by mouth daily.    05/08/2016 at Unknown time  . cloNIDine (CATAPRES) 0.1 MG tablet Take 1 tablet (0.1 mg total) by mouth 2 (two) times daily. 60 tablet 11 05/08/2016 at Unknown time  . epoetin alfa (EPOGEN,PROCRIT) 53614 UNIT/ML injection Inject 1 mL (10,000 Units total) into the vein every Monday, Wednesday, and Friday with hemodialysis. 1 mL 10 05/06/2016 at Unknown time  . fluticasone (FLONASE) 50 MCG/ACT nasal spray Place 2 sprays into the nose daily as needed for allergies.    unknown  . labetalol (NORMODYNE) 200 MG tablet Take 1 tablet (200 mg total) by mouth 2 (two) times daily. 90 tablet 1 05/08/2016 at Unknown time  . lanthanum  (FOSRENOL) 1000 MG chewable tablet Chew 1 tablet (1,000 mg total) by mouth 3 (three) times daily with meals. 90 tablet 1 05/08/2016 at Unknown time  . multivitamin (RENA-VIT) TABS tablet Take 1 tablet by mouth daily.   05/08/2016 at Unknown time  . nitroGLYCERIN (NITROSTAT) 0.4 MG SL tablet Place 1 tablet (0.4 mg total) under the tongue every 5 (five) minutes as needed for chest pain. 25 tablet 3 05/08/2016 at Unknown time  . omeprazole (PRILOSEC) 20 MG capsule TAKE ONE CAPSULE BY MOUTH ONCE DAILY 30 capsule 5 05/08/2016 at Unknown time  . PROAIR HFA 108 (90 BASE) MCG/ACT inhaler Inhale 1 puff into the lungs every 6 (six) hours as needed for wheezing or shortness of breath.    unknown  . SENSIPAR 30 MG tablet Take 30 mg by mouth daily with supper.    05/08/2016 at Unknown time  . simvastatin (ZOCOR) 20 MG tablet Take 1 tablet (20 mg total) by mouth at bedtime. 90 tablet  3 05/07/2016 at Unknown time   Scheduled: . amiodarone  200 mg Oral Daily  . aspirin EC  81 mg Oral Daily  . chlorhexidine gluconate (MEDLINE KIT)  15 mL Mouth Rinse BID  . chlorhexidine gluconate (MEDLINE KIT)  15 mL Mouth Rinse BID  . famotidine (PEPCID) IV  20 mg Intravenous Q24H  . heparin  5,000 Units Subcutaneous Q8H  . mouth rinse  15 mL Mouth Rinse QID  . mouth rinse  15 mL Mouth Rinse QID  . multivitamin  1 tablet Oral Daily  . NIFEdipine  30 mg Oral Daily   Continuous: . nitroGLYCERIN 45 mcg/min (05/13/16 0130)   FPK:GYBNLW chloride, sodium chloride, sodium chloride, sodium chloride, acetaminophen, acetaminophen, ALPRAZolam, fentaNYL (SUBLIMAZE) injection, fentaNYL (SUBLIMAZE) injection, fluticasone, ipratropium-albuterol, lidocaine (PF), lidocaine-prilocaine, midazolam, ondansetron (ZOFRAN) IV, pentafluoroprop-tetrafluoroeth  Assesment: She was admitted with hypertensive crisis. She was hyperkalemic. She is known to have chronic diastolic heart failure and end-stage renal disease on hemodialysis. 2 days ago she  suffered bradycardia cardiopulmonary arrest CPR and required intubation and mechanical ventilation. She had another code blue yesterday.  Principal Problem:   Hypertensive crisis Active Problems:   Coronary atherosclerosis of native coronary artery   ESRD on hemodialysis (HCC)   Anemia of chronic kidney failure   Chronic diastolic CHF (congestive heart failure) (HCC)   Chest pain   Acute respiratory failure ([place])   Hypertensive crisis without congestive heart failure   Cardiac arrest [facility])   Palliative care encounter   DNR (do not resuscitate) discussion   Goals of care, counseling/discussion    Plan: Attempt weaning today.    LOS: 4 days   Shawna [name] 05/13/2016, 8:11 AM

## 2016-05-13 NOTE — Progress Notes (Signed)
Patient has been awake and alert on the vent with intermittent moments of dosing off. Patient is trying to communicate needs. She is very restless when awake and keeps throwing leg over side of bed. Neuro checks are WNL and CBG is 125. She states she is in no pain. Will continue to monitor patient.  Margaret Pyle, RN

## 2016-05-13 NOTE — Care Management Important Message (Signed)
Important Message  Patient Details  Name: Shawna [name] MRN: [id number] Date of Birth: July 13, 1940   Medicare Important Message Given:  Yes    Sherald Barge, RN 05/13/2016, 12:20 PM

## 2016-05-14 LAB — RENAL FUNCTION PANEL
ALBUMIN: 3 g/dL — AB (ref 3.5–5.0)
Anion gap: 18 — ABNORMAL HIGH (ref 5–15)
BUN: 70 mg/dL — AB (ref 6–20)
CALCIUM: 8.6 mg/dL — AB (ref 8.9–10.3)
CO2: 26 mmol/L (ref 22–32)
CREATININE: 9.58 mg/dL — AB (ref 0.44–1.00)
Chloride: 96 mmol/L — ABNORMAL LOW (ref 101–111)
GFR calc Af Amer: 4 mL/min — ABNORMAL LOW (ref >60)
GFR calc non Af Amer: 3 mL/min — ABNORMAL LOW (ref >60)
GLUCOSE: 108 mg/dL — AB (ref 65–99)
PHOSPHORUS: 7.3 mg/dL — AB (ref 2.5–4.6)
Potassium: 4.4 mmol/L (ref 3.5–5.1)
SODIUM: 140 mmol/L (ref 135–145)

## 2016-05-14 LAB — CBC
HEMATOCRIT: 28.2 % — AB (ref 36.0–46.0)
Hemoglobin: 8.9 g/dL — ABNORMAL LOW (ref 12.0–15.0)
MCH: 31.2 pg (ref 26.0–34.0)
MCHC: 31.6 g/dL (ref 30.0–36.0)
MCV: 98.9 fL (ref 78.0–100.0)
Platelets: 230 10*3/uL (ref 150–400)
RBC: 2.85 MIL/uL — ABNORMAL LOW (ref 3.87–5.11)
RDW: 17.1 % — AB (ref 11.5–15.5)
WBC: 9.6 10*3/uL (ref 4.0–10.5)

## 2016-05-14 LAB — BLOOD GAS, ARTERIAL
Acid-Base Excess: 3 mmol/L — ABNORMAL HIGH (ref 0.0–2.0)
BICARBONATE: 27 mmol/L (ref 20.0–28.0)
DRAWN BY: 105551
FIO2: 40
MECHVT: 380 mL
O2 Saturation: 97.9 %
PEEP/CPAP: 5 cmH2O
RATE: 14 resp/min
pCO2 arterial: 41 mmHg (ref 32.0–48.0)
pH, Arterial: 7.433 (ref 7.350–7.450)
pO2, Arterial: 143 mmHg — ABNORMAL HIGH (ref 83.0–108.0)

## 2016-05-14 LAB — GLUCOSE, CAPILLARY
GLUCOSE-CAPILLARY: 87 mg/dL (ref 65–99)
Glucose-Capillary: 92 mg/dL (ref 65–99)
Glucose-Capillary: 94 mg/dL (ref 65–99)

## 2016-05-14 NOTE — Progress Notes (Signed)
PROGRESS NOTE    Shawna [name]  ZOX:[id number] DOB: 01-Mar-1941 DOA: 05/08/2016 PCP: [name], MD    Brief Narrative:  44 yof with a history of CAD and ESRD, presented with acute onset of chest pain unrelieved by aspirin and nitroglycerin. In the emergency department BP 230/130 and she was noted to have new ST depression. She was started on nitroglycerin infusion and IV labetalol and admitted for hypertensive crisis with chest pain.Her chest pain had resolved but she remained hypertensive. On 11/1 patient suddenly became bradycardic and unresponsive. She underwent CPR, chest compressions, received epinephrine and had ROSC. On 11/2 prior to starting her dialysis she was noted to be hypertensive so she was given IV labetalol and developed bradycardia and become unresponsive once again. The same actions were taken as previous. She is currently on ventilatory support.   Assessment & Plan:   Principal Problem:   Hypertensive crisis Active Problems:   Coronary atherosclerosis of native coronary artery   ESRD on hemodialysis (HCC)   Anemia of chronic kidney failure   Chronic diastolic CHF (congestive heart failure) (HCC)   Chest pain   Acute respiratory failure ([place])   Hypertensive crisis without congestive heart failure   Cardiac arrest [facility])   Palliative care encounter   DNR (do not resuscitate) discussion   Goals of care, counseling/discussion  1. Bradycardia with PEA cardiac arrest. On 11/1, patient's heart rate became bradycardic into PEA. She underwent CPR, received epinephrine, bicarbonate and calcium chloride. She had ROSC after approximately 25 mins of CPR. Lab work had indicated an elevated potassium of 6.4, which may have been cause. She was briefly started on a epinephrine infusion which was quickly weaned off. CXR does not show any acute process. Cardiology following. On 11/2, prior to starting dialysis, patient was noted to be hypertensive, despite being on nitroglycerin  infusion. She received one dose of IV labetalol and subsequently developed bradycardia and went into PEA arrest. She received epinephrine, bicarbonate, and calcium chloride, as well as CPR for < 67mns and had ROSC. Currently, heart rate has been stable but elevated. ? Need for pacemaker. Would defer to cardiology  2. Acute respiratory failure. Ventilator dependent. Patient intubated while undergoing CPR. Pulmonology following. She is undergoing daily weaning trials. 3. Hypertensive crisis with chest pain. Patient was admitted with a blood pressure of 230/130 and chest pain. She was started on nitroglycerin and IV labetalol which were weaned off. Her chest pain had resolved. At this time, her blood pressure has started to rise again. She is currently on nitro infusion.  4. Elevated troponin. Patient has a history of CAD status post CABG and stenting. Patient had no evidence of ACS on admission. Since that time, her troponin has started to rise. Cardiology following and felt that this may be demand ischemia/related to CPR. Troponin peak was 1.5 and has since trended down.  5. ESRD on HD MWF. Nephrology following. She underwent dialysis on Thursday 11/2 with significant improvement of hyperkalemia.  6. Chronic diastolic congestive heart failure. Patient has an echocardiogram on 09/2015 with an EF of 681-19% grade 2 diastolic dysfunction. Continue fluid management with dialysis. 7. PSVT, maintained on amiodarone. EKG remains in sinus rhythm.  8. Myoclonus. Suspect secondary to CKD/ESRD on HD. No history or objective findings to suggest acute CNS process. Reviewed meds, no likely offenders. Monitor for now. 9. Hyperkalemia. Resolved.  10. Elevated TSH. Recheck as an outpatient. 11. Anemia of chronic kidney disease. Hemoglobin has trended down to 8.9. There are no  signs of active bleeding at this time. Will continue to monitor and recheck labs in the a.m.   DVT prophylaxis: Heparin injection  Code Status: FULL   Family Communication: husband at bedside Disposition Plan: Discharge home once improved.    Consultants:   Nephrology   Cardiology   Pulmonology   Nutrition   Procedures:  None   Antimicrobials:  None    Subjective: Patient is awake on ventilator. Unable to speak, but denies any complaints by shaking her head  Objective: Vitals:   05/14/16 0515 05/14/16 0530 05/14/16 0545 05/14/16 0600  BP: (!) 147/56 137/65 (!) 125/58 (!) 149/75  Pulse: 93 85 91 96  Resp: _0 Temp:      TempSrc:      SpO2: 100% 100% 100% 100%  Weight:      Height:        Intake/Output Summary (Last 24 hours) at 05/14/16 0616 Last data filed at 05/14/16 0600  Gross per 24 hour  Intake           510.96 ml  Output             1000 ml  Net          -489.04 ml   Filed Weights   05/12/16 1340 05/13/16 0455 05/14/16 0400  Weight: 60.3 kg (132 lb 15 oz) 59.3 kg (130 lb 11.7 oz) 58.4 kg (128 lb 12 oz)    Examination:   General exam: Appears calm and comfortable, intubated  Respiratory system: Clear to auscultation. Respiratory effort normal. Cardiovascular system: S1 & S2 heard, RRR. No JVD, murmurs, rubs, gallops or clicks. No pedal edema. Gastrointestinal system: Abdomen is nondistended, soft and nontender. No organomegaly or masses felt. Normal bowel sounds heard. Central nervous system: Alert and oriented. No focal neurological deficits. Extremities: Symmetric 5 x 5 power. Skin: No rashes, lesions or ulcers Psychiatry: unable to assess    Data Reviewed: I have personally reviewed following labs and imaging studies  CBC:  Recent Labs Lab 05/08/16 2326  05/11/16 0817 05/12/16 0359 05/12/16 1404 05/13/16 0430 05/14/16 0408  WBC 7.3  < > 8.8 10.1 18.0* 10.8* 9.6  NEUTROABS 5.1  --   --   --   --   --   --   HGB 10.5*  < > 9.7* 10.8* 10.2* 9.9* 8.9*  HCT 34.0*  < > 31.8* 34.9* 33.0* 31.3* 28.2*  MCV 101.8*  < > 102.9* 100.0 100.6* 99.1 98.9  PLT 307  < > 263 260 253  218 230  < > = values in this interval not displayed. Basic Metabolic Panel:  Recent Labs Lab 05/09/16 1430 05/11/16 0631 05/11/16 0817 05/11/16 1410 05/12/16 0359 05/12/16 1404 05/13/16 0430 05/14/16 0408  NA 135  135 131* 133* 137 135 139 138 140  K 3.9  3.9 6.4* 6.3* 4.0 5.6* 5.3* 4.4 4.4  CL 99*  99* 92* 92* 96* 95* 95* 96* 96*  CO2 _1 17* _2 GLUCOSE 112*  111* 89 235* 100* 90 162* 127* 108*  BUN 43*  42* 47* 50* 19 38* 49* 47* 70*  CREATININE 7.39*  7.56* 8.58* 9.09* 4.08* 6.35* 7.49* 6.74* 9.58*  CALCIUM 7.9*  7.9* 7.9* 10.5* 9.2 8.6* 11.0* 8.4* 8.6*  MG  --   --  2.0  --   --   --   --   --   PHOS 1.3* 7.0*  --  4.2  --   --  6.1* 7.3*   GFR: Estimated Creatinine Clearance: 4.1 mL/min (by C-G formula based on SCr of 9.58 mg/dL (H)). Liver Function Tests:  Recent Labs Lab 05/08/16 2326  05/11/16 0817 05/11/16 1410 05/12/16 0359 05/13/16 0430 05/14/16 0408  AST 18  --  711*  --  421*  --   --   ALT 13*  --  629*  --  529*  --   --   ALKPHOS 79  --  76  --  92  --   --   BILITOT 0.7  --  0.6  --  1.1  --   --   PROT 6.9  --  5.4*  --  6.5  --   --   ALBUMIN 3.7  < > 2.9* 3.6 3.4* 3.1* 3.0*  < > = values in this interval not displayed. No results for input(s): LIPASE, AMYLASE in the last 168 hours. No results for input(s): AMMONIA in the last 168 hours. Coagulation Profile:  Recent Labs Lab 05/08/16 2351  INR 0.92   Cardiac Enzymes:  Recent Labs Lab 05/09/16 1430 05/11/16 0817 05/11/16 1410 05/11/16 2010 05/12/16 0948  TROPONINI 0.05* 0.05* 1.27* 1.53* 0.66*   BNP (last 3 results) No results for input(s): PROBNP in the last 8760 hours. HbA1C: No results for input(s): HGBA1C in the last 72 hours. CBG:  Recent Labs Lab 05/09/16 2029 05/11/16 0735 05/13/16 1507  GLUCAP 160* 77 125*   Lipid Profile: No results for input(s): CHOL, HDL, LDLCALC, TRIG, CHOLHDL, LDLDIRECT in the last 72 hours. Thyroid Function  Tests: No results for input(s): TSH, T4TOTAL, FREET4, T3FREE, THYROIDAB in the last 72 hours. Anemia Panel: No results for input(s): VITAMINB12, FOLATE, FERRITIN, TIBC, IRON, RETICCTPCT in the last 72 hours. Sepsis Labs: No results for input(s): PROCALCITON, LATICACIDVEN in the last 168 hours.  Recent Results (from the past 240 hour(s))  MRSA PCR Screening     Status: None   Collection Time: 05/09/16  2:38 AM  Result Value Ref Range Status   MRSA by PCR NEGATIVE NEGATIVE Final    Comment:        The GeneXpert MRSA Assay (FDA approved for NASAL specimens only), is one component of a comprehensive MRSA colonization surveillance program. It is not intended to diagnose MRSA infection nor to guide or monitor treatment for MRSA infections.       Radiology Studies: US Venous Img Upper Uni Right  Result Date: 05/13/2016 CLINICAL DATA:  Recent placement of a tunneled catheter. Right upper extremity swelling. EXAM: RIGHT UPPER EXTREMITY VENOUS DOPPLER ULTRASOUND TECHNIQUE: Gray-scale sonography with graded compression, as well as color Doppler and duplex ultrasound were performed to evaluate the upper extremity deep venous system from the level of the subclavian vein and including the jugular, axillary, basilic and upper cephalic vein. Spectral Doppler was utilized to evaluate flow at rest and with distal augmentation maneuvers. COMPARISON:  None. FINDINGS: Normal color Doppler flow and phasicity in the left subclavian vein. There appears to an echogenic structure in the right internal jugular vein compatible with the tunneled right jugular central venous catheter. The right internal jugular vein does appear to be patent with phasicity. There is compressibility, color Doppler flow and phasicity in the right subclavian vein. Normal compressibility, color Doppler flow and augmentation in the right axillary vein. Normal compressibility, color Doppler flow and augmentation in the right brachial veins.  Radial veins appear to be patent. Ulnar veins are poorly visualized. Right cephalic vein demonstrates normal compressibility and color Doppler  flow. The right basilic vein is decompressed and there are linear echoes extending along the brachial vein which could represent an intravenous catheter or related to a prior PICC line placement. There is no flow within the right basilic vein. Other findings:  None visualized. IMPRESSION: No evidence for deep venous thrombosis in the right upper extremity. Thrombosis of the right basilic vein which is a superficial vein. In addition, there is evidence for either a prior catheter in this thrombosed vein or there is currently a catheter in this right basilic vein. Recommend clinical correlation. Electronically Signed   By: Markus Daft M.D.   On: 05/13/2016 15:30   Dg Chest Port 1 View  Result Date: 05/13/2016 CLINICAL DATA:  Respiratory failure. EXAM: PORTABLE CHEST 1 VIEW COMPARISON:  05/12/2016 . FINDINGS: Endotracheal tube, feeding tube, right IJ line in stable position. Prior CABG. Heart size normal. Low lung volumes with mild basilar atelectasis. No prominent pleural effusion or pneumothorax . IMPRESSION: 1. Lines and tubes in stable position. 2. Prior CABG.  Heart size normal. 3. Low lung volumes with mild basilar atelectasis. Electronically Signed   By: Marcello Moores  Register   On: 05/13/2016 07:33   Portable Chest Xray  Result Date: 05/12/2016 CLINICAL DATA:  Intubation. EXAM: PORTABLE CHEST 1 VIEW COMPARISON:  05/11/2016. FINDINGS: Endotracheal tube, NG tube, right IJ line in stable position. Prior CABG. Normal pulmonary vascularity. Low lung volumes. Small bilateral pleural effusions again cannot be excluded. IMPRESSION: 1. Lines and tubes in stable position. 2. Prior CABG.  Heart size stable. 3. Low lung volumes with bibasilar atelectasis again noted. Small bilateral pleural effusions again cannot be excluded. Chest is unchanged from prior exam. Electronically  Signed   By: Marcello Moores  Register   On: 05/12/2016 07:38        Scheduled Meds: . amiodarone  200 mg Oral Daily  . aspirin EC  81 mg Oral Daily  . chlorhexidine gluconate (MEDLINE KIT)  15 mL Mouth Rinse BID  . chlorhexidine gluconate (MEDLINE KIT)  15 mL Mouth Rinse BID  . famotidine (PEPCID) IV  20 mg Intravenous Q24H  . heparin  5,000 Units Subcutaneous Q8H  . mouth rinse  15 mL Mouth Rinse QID  . mouth rinse  15 mL Mouth Rinse QID  . multivitamin  1 tablet Oral Daily  . NIFEdipine  30 mg Oral Daily   Continuous Infusions: . nitroGLYCERIN 80 mcg/min (05/14/16 0100)     LOS: 5 days    Time spent: 25 minutes   Kathie Dike, MD [facility] If 7PM-7AM, please contact night-coverage [email or web address] Password [email or web address] 05/14/2016, 6:16 AM

## 2016-05-14 NOTE — Progress Notes (Signed)
Subjective: Interval History: Patient remained intubated. However she seems to be alert and answering questions by gesture.  Objective: Vital signs in last 24 hours: Temp:  [98.1 F (36.7 C)-99.6 F (37.6 C)] 98.1 F (36.7 C) (11/04 0830) Pulse Rate:  [79-112] 85 (11/04 0645) Resp:  [0-25] 14 (11/04 0645) BP: (125-196)/(56-131) 158/71 (11/04 0645) SpO2:  [97 %-100 %] 100 % (11/04 0931) FiO2 (%):  [40 %] 40 % (11/04 0931) Weight:  [58.4 kg (128 lb 12 oz)] 58.4 kg (128 lb 12 oz) (11/04 0815) Weight change: -1.9 kg (-4 lb 3 oz)  Intake/Output from previous day: 11/03 0701 - 11/04 0700 In: 511 [I.V.:511] Out: 1000 [Emesis/NG output:1000] Intake/Output this shift: No intake/output data recorded.  Patient is presently alert but remained intubated. She is on hemodialysis. Chest: Inspiratory crackles Heart: RRR  Extremties: No edema  Lab Results:  Recent Labs  05/13/16 0430 05/14/16 0408  WBC 10.8* 9.6  HGB 9.9* 8.9*  HCT 31.3* 28.2*  PLT 218 230   BMET:   Recent Labs  05/13/16 0430 05/14/16 0408  NA 138 140  K 4.4 4.4  CL 96* 96*  CO2 27 26  GLUCOSE 127* 108*  BUN 47* 70*  CREATININE 6.74* 9.58*  CALCIUM 8.4* 8.6*   No results for input(s): PTH in the last 72 hours. Iron Studies: No results for input(s): IRON, TIBC, TRANSFERRIN, FERRITIN in the last 72 hours.  Studies/Results: US Venous Img Upper Uni Right  Result Date: 05/13/2016 CLINICAL DATA:  Recent placement of a tunneled catheter. Right upper extremity swelling. EXAM: RIGHT UPPER EXTREMITY VENOUS DOPPLER ULTRASOUND TECHNIQUE: Gray-scale sonography with graded compression, as well as color Doppler and duplex ultrasound were performed to evaluate the upper extremity deep venous system from the level of the subclavian vein and including the jugular, axillary, basilic and upper cephalic vein. Spectral Doppler was utilized to evaluate flow at rest and with distal augmentation maneuvers. COMPARISON:  None.  FINDINGS: Normal color Doppler flow and phasicity in the left subclavian vein. There appears to an echogenic structure in the right internal jugular vein compatible with the tunneled right jugular central venous catheter. The right internal jugular vein does appear to be patent with phasicity. There is compressibility, color Doppler flow and phasicity in the right subclavian vein. Normal compressibility, color Doppler flow and augmentation in the right axillary vein. Normal compressibility, color Doppler flow and augmentation in the right brachial veins. Radial veins appear to be patent. Ulnar veins are poorly visualized. Right cephalic vein demonstrates normal compressibility and color Doppler flow. The right basilic vein is decompressed and there are linear echoes extending along the brachial vein which could represent an intravenous catheter or related to a prior PICC line placement. There is no flow within the right basilic vein. Other findings:  None visualized. IMPRESSION: No evidence for deep venous thrombosis in the right upper extremity. Thrombosis of the right basilic vein which is a superficial vein. In addition, there is evidence for either a prior catheter in this thrombosed vein or there is currently a catheter in this right basilic vein. Recommend clinical correlation. Electronically Signed   By: Markus Daft M.D.   On: 05/13/2016 15:30   Dg Chest Port 1 View  Result Date: 05/13/2016 CLINICAL DATA:  Respiratory failure. EXAM: PORTABLE CHEST 1 VIEW COMPARISON:  05/12/2016 . FINDINGS: Endotracheal tube, feeding tube, right IJ line in stable position. Prior CABG. Heart size normal. Low lung volumes with mild basilar atelectasis. No prominent pleural effusion or pneumothorax .  IMPRESSION: 1. Lines and tubes in stable position. 2. Prior CABG.  Heart size normal. 3. Low lung volumes with mild basilar atelectasis. Electronically Signed   By: Marcello Moores  Register   On: 05/13/2016 07:33    I have reviewed the  patient's current medications.  Assessment/Plan: Problem #1 patient with episode of bradycardia/unresponsive/status post CODE BLUE and intubated. Problem #2 hypertension: Her blood pressure at this moment seems to be still high but fluctuating. Problem #3 end-stage renal disease: She is status post hemodialysis the last 2 days. Her potassium is presently normal. Problem #5 anemia: Her hemoglobin is below our target goal. Presently she is on Epogen. Her hemoglobin is declining. Problem #6 fluid management: Patient status post hemodialysis the day before yesterday presently she doesn't have any significant sign of fluid overload. Patient is on dialysis. Problem #7 metabolic bone disease: Her calcium is normal but her phosphorus is high. Not getting binder because of intubation. Plan: 1]Will continue with dialysis 2] will check renal panel in the morning 3] will remove 2 L if her systolic blood pressure remains stable 4] will continue with Epogen with dialysis.   LOS: 5 days   Dal Blew S 05/14/2016,9:49 AM

## 2016-05-14 NOTE — Progress Notes (Signed)
Subjective: She remains intubated and on the ventilator. She is undergoing dialysis now. She is awake when I saw her but later became less responsive. This is thought to be related to dropping her blood pressure. No other new complaints have been noted.  Objective: Vital signs in last 24 hours: Temp:  [98.1 F (36.7 C)-99.6 F (37.6 C)] 98.1 F (36.7 C) (11/04 0830) Pulse Rate:  [79-112] 85 (11/04 0645) Resp:  [0-25] 14 (11/04 0645) BP: (125-196)/(56-131) 158/71 (11/04 0645) SpO2:  [97 %-100 %] 100 % (11/04 0931) FiO2 (%):  [40 %] 40 % (11/04 0931) Weight:  [58.4 kg (128 lb 12 oz)] 58.4 kg (128 lb 12 oz) (11/04 0815) Weight change: -1.9 kg (-4 lb 3 oz) Last BM Date: 05/08/16  Intake/Output from previous day: 11/03 0701 - 11/04 0700 In: 511 [I.V.:511] Out: 1000 [Emesis/NG output:1000]  PHYSICAL EXAM General appearance: alert, cooperative and Intubated on the ventilator and later less responsive as noted above Resp: rhonchi bilaterally Cardio: regular rate and rhythm, S1, S2 normal, no murmur, click, rub or gallop GI: soft, non-tender; bowel sounds normal; no masses,  no organomegaly Extremities: extremities normal, atraumatic, no cyanosis or edema Skin warm and dry. Pupils reactive. Mucous membranes are moist  Lab Results:  Results for orders placed or performed during the hospital encounter of 05/08/16 (from the past 48 hour(s))  CBC     Status: Abnormal   Collection Time: 05/12/16  2:04 PM  Result Value Ref Range   WBC 18.0 (H) 4.0 - 10.5 K/uL   RBC 3.28 (L) 3.87 - 5.11 MIL/uL   Hemoglobin 10.2 (L) 12.0 - 15.0 g/dL   HCT 33.0 (L) 36.0 - 46.0 %   MCV 100.6 (H) 78.0 - 100.0 fL   MCH 31.1 26.0 - 34.0 pg   MCHC 30.9 30.0 - 36.0 g/dL   RDW 17.2 (H) 11.5 - 15.5 %   Platelets 253 150 - 400 K/uL  Basic metabolic panel     Status: Abnormal   Collection Time: 05/12/16  2:04 PM  Result Value Ref Range   Sodium 139 135 - 145 mmol/L   Potassium 5.3 (H) 3.5 - 5.1 mmol/L    Chloride 95 (L) 101 - 111 mmol/L   CO2 28 22 - 32 mmol/L   Glucose, Bld 162 (H) 65 - 99 mg/dL   BUN 49 (H) 6 - 20 mg/dL   Creatinine, Ser 7.49 (H) 0.44 - 1.00 mg/dL   Calcium 11.0 (H) 8.9 - 10.3 mg/dL    Comment: DELTA CHECK NOTED   GFR calc non Af Amer 5 (L) >60 mL/min   GFR calc Af Amer 5 (L) >60 mL/min    Comment: (NOTE) The eGFR has been calculated using the CKD EPI equation. This calculation has not been validated in all clinical situations. eGFR's persistently <60 mL/min signify possible Chronic Kidney Disease.    Anion gap 16 (H) 5 - 15  Renal function panel     Status: Abnormal   Collection Time: 05/13/16  4:30 AM  Result Value Ref Range   Sodium 138 135 - 145 mmol/L   Potassium 4.4 3.5 - 5.1 mmol/L    Comment: DELTA CHECK NOTED   Chloride 96 (L) 101 - 111 mmol/L   CO2 27 22 - 32 mmol/L   Glucose, Bld 127 (H) 65 - 99 mg/dL   BUN 47 (H) 6 - 20 mg/dL   Creatinine, Ser 6.74 (H) 0.44 - 1.00 mg/dL   Calcium 8.4 (L) 8.9 - 10.3  mg/dL   Phosphorus 6.1 (H) 2.5 - 4.6 mg/dL   Albumin 3.1 (L) 3.5 - 5.0 g/dL   GFR calc non Af Amer 5 (L) >60 mL/min   GFR calc Af Amer 6 (L) >60 mL/min    Comment: (NOTE) The eGFR has been calculated using the CKD EPI equation. This calculation has not been validated in all clinical situations. eGFR's persistently <60 mL/min signify possible Chronic Kidney Disease.    Anion gap 15 5 - 15  CBC     Status: Abnormal   Collection Time: 05/13/16  4:30 AM  Result Value Ref Range   WBC 10.8 (H) 4.0 - 10.5 K/uL   RBC 3.16 (L) 3.87 - 5.11 MIL/uL   Hemoglobin 9.9 (L) 12.0 - 15.0 g/dL   HCT 31.3 (L) 36.0 - 46.0 %   MCV 99.1 78.0 - 100.0 fL   MCH 31.3 26.0 - 34.0 pg   MCHC 31.6 30.0 - 36.0 g/dL   RDW 17.3 (H) 11.5 - 15.5 %   Platelets 218 150 - 400 K/uL  Blood gas, arterial     Status: Abnormal   Collection Time: 05/13/16  6:20 AM  Result Value Ref Range   FIO2 0.40    Delivery systems VENTILATOR    Mode PRESSURE REGULATED VOLUME CONTROL    VT 380  mL   LHR 14 resp/min   Peep/cpap 5.0 cm H20   pH, Arterial 7.466 (H) 7.350 - 7.450   pCO2 arterial 37.5 32.0 - 48.0 mmHg   pO2, Arterial 165 (H) 83.0 - 108.0 mmHg   Bicarbonate 27.3 20.0 - 28.0 mmol/L   Acid-Base Excess 3.1 (H) 0.0 - 2.0 mmol/L   O2 Saturation 98.6 %   Patient temperature 37.0    Collection site RIGHT RADIAL    Drawn by [phone]    Sample type ARTERIAL DRAW    Allens test (pass/fail) PASS PASS  Glucose, capillary     Status: Abnormal   Collection Time: 05/13/16  3:07 PM  Result Value Ref Range   Glucose-Capillary 125 (H) 65 - 99 mg/dL  Renal function panel     Status: Abnormal   Collection Time: 05/14/16  4:08 AM  Result Value Ref Range   Sodium 140 135 - 145 mmol/L   Potassium 4.4 3.5 - 5.1 mmol/L   Chloride 96 (L) 101 - 111 mmol/L   CO2 26 22 - 32 mmol/L   Glucose, Bld 108 (H) 65 - 99 mg/dL   BUN 70 (H) 6 - 20 mg/dL   Creatinine, Ser 9.58 (H) 0.44 - 1.00 mg/dL   Calcium 8.6 (L) 8.9 - 10.3 mg/dL   Phosphorus 7.3 (H) 2.5 - 4.6 mg/dL   Albumin 3.0 (L) 3.5 - 5.0 g/dL   GFR calc non Af Amer 3 (L) >60 mL/min   GFR calc Af Amer 4 (L) >60 mL/min    Comment: (NOTE) The eGFR has been calculated using the CKD EPI equation. This calculation has not been validated in all clinical situations. eGFR's persistently <60 mL/min signify possible Chronic Kidney Disease.    Anion gap 18 (H) 5 - 15  CBC     Status: Abnormal   Collection Time: 05/14/16  4:08 AM  Result Value Ref Range   WBC 9.6 4.0 - 10.5 K/uL   RBC 2.85 (L) 3.87 - 5.11 MIL/uL   Hemoglobin 8.9 (L) 12.0 - 15.0 g/dL   HCT 28.2 (L) 36.0 - 46.0 %   MCV 98.9 78.0 - 100.0 fL   MCH  31.2 26.0 - 34.0 pg   MCHC 31.6 30.0 - 36.0 g/dL   RDW 17.1 (H) 11.5 - 15.5 %   Platelets 230 150 - 400 K/uL  Blood gas, arterial     Status: Abnormal   Collection Time: 05/14/16  6:00 AM  Result Value Ref Range   FIO2 40.00    Delivery systems VENTILATOR    Mode PRESSURE REGULATED VOLUME CONTROL    VT 380 mL   LHR 14 resp/min    Peep/cpap 5.0 cm H20   pH, Arterial 7.433 7.350 - 7.450   pCO2 arterial 41.0 32.0 - 48.0 mmHg   pO2, Arterial 143.0 (H) 83.0 - 108.0 mmHg   Bicarbonate 27.0 20.0 - 28.0 mmol/L   Acid-Base Excess 3.0 (H) 0.0 - 2.0 mmol/L   O2 Saturation 97.9 %   Collection site RADIAL    Drawn by [phone]    Sample type ARTERIAL    Allens test (pass/fail) PASS PASS    ABGS  Recent Labs  05/14/16 0600  PHART 7.433  PO2ART 143.0*  HCO3 27.0   CULTURES Recent Results (from the past 240 hour(s))  MRSA PCR Screening     Status: None   Collection Time: 05/09/16  2:38 AM  Result Value Ref Range Status   MRSA by PCR NEGATIVE NEGATIVE Final    Comment:        The GeneXpert MRSA Assay (FDA approved for NASAL specimens only), is one component of a comprehensive MRSA colonization surveillance program. It is not intended to diagnose MRSA infection nor to guide or monitor treatment for MRSA infections.    Studies/Results: US Venous Img Upper Uni Right  Result Date: 05/13/2016 CLINICAL DATA:  Recent placement of a tunneled catheter. Right upper extremity swelling. EXAM: RIGHT UPPER EXTREMITY VENOUS DOPPLER ULTRASOUND TECHNIQUE: Gray-scale sonography with graded compression, as well as color Doppler and duplex ultrasound were performed to evaluate the upper extremity deep venous system from the level of the subclavian vein and including the jugular, axillary, basilic and upper cephalic vein. Spectral Doppler was utilized to evaluate flow at rest and with distal augmentation maneuvers. COMPARISON:  None. FINDINGS: Normal color Doppler flow and phasicity in the left subclavian vein. There appears to an echogenic structure in the right internal jugular vein compatible with the tunneled right jugular central venous catheter. The right internal jugular vein does appear to be patent with phasicity. There is compressibility, color Doppler flow and phasicity in the right subclavian vein. Normal compressibility,  color Doppler flow and augmentation in the right axillary vein. Normal compressibility, color Doppler flow and augmentation in the right brachial veins. Radial veins appear to be patent. Ulnar veins are poorly visualized. Right cephalic vein demonstrates normal compressibility and color Doppler flow. The right basilic vein is decompressed and there are linear echoes extending along the brachial vein which could represent an intravenous catheter or related to a prior PICC line placement. There is no flow within the right basilic vein. Other findings:  None visualized. IMPRESSION: No evidence for deep venous thrombosis in the right upper extremity. Thrombosis of the right basilic vein which is a superficial vein. In addition, there is evidence for either a prior catheter in this thrombosed vein or there is currently a catheter in this right basilic vein. Recommend clinical correlation. Electronically Signed   By: Markus Daft M.D.   On: 05/13/2016 15:30   Dg Chest Port 1 View  Result Date: 05/13/2016 CLINICAL DATA:  Respiratory failure. EXAM: PORTABLE CHEST 1 VIEW  COMPARISON:  05/12/2016 . FINDINGS: Endotracheal tube, feeding tube, right IJ line in stable position. Prior CABG. Heart size normal. Low lung volumes with mild basilar atelectasis. No prominent pleural effusion or pneumothorax . IMPRESSION: 1. Lines and tubes in stable position. 2. Prior CABG.  Heart size normal. 3. Low lung volumes with mild basilar atelectasis. Electronically Signed   By: Marcello Moores  Register   On: 05/13/2016 07:33    Medications:  Prior to Admission:  Prescriptions Prior to Admission  Medication Sig Dispense Refill Last Dose  . ALPRAZolam (XANAX) 0.25 MG tablet Take 1-2 tablets by mouth at bedtime as needed for sleep.    05/08/2016 at Unknown time  . amiodarone (PACERONE) 200 MG tablet Take 1 tablet (200 mg total) by mouth daily. 30 tablet 3 05/08/2016 at Unknown time  . aspirin EC 81 MG tablet Take 81 mg by mouth daily.     05/08/2016 at Unknown time  . cloNIDine (CATAPRES) 0.1 MG tablet Take 1 tablet (0.1 mg total) by mouth 2 (two) times daily. 60 tablet 11 05/08/2016 at Unknown time  . epoetin alfa (EPOGEN,PROCRIT) 64332 UNIT/ML injection Inject 1 mL (10,000 Units total) into the vein every Monday, Wednesday, and Friday with hemodialysis. 1 mL 10 05/06/2016 at Unknown time  . fluticasone (FLONASE) 50 MCG/ACT nasal spray Place 2 sprays into the nose daily as needed for allergies.    unknown  . labetalol (NORMODYNE) 200 MG tablet Take 1 tablet (200 mg total) by mouth 2 (two) times daily. 90 tablet 1 05/08/2016 at Unknown time  . lanthanum (FOSRENOL) 1000 MG chewable tablet Chew 1 tablet (1,000 mg total) by mouth 3 (three) times daily with meals. 90 tablet 1 05/08/2016 at Unknown time  . multivitamin (RENA-VIT) TABS tablet Take 1 tablet by mouth daily.   05/08/2016 at Unknown time  . nitroGLYCERIN (NITROSTAT) 0.4 MG SL tablet Place 1 tablet (0.4 mg total) under the tongue every 5 (five) minutes as needed for chest pain. 25 tablet 3 05/08/2016 at Unknown time  . omeprazole (PRILOSEC) 20 MG capsule TAKE ONE CAPSULE BY MOUTH ONCE DAILY 30 capsule 5 05/08/2016 at Unknown time  . PROAIR HFA 108 (90 BASE) MCG/ACT inhaler Inhale 1 puff into the lungs every 6 (six) hours as needed for wheezing or shortness of breath.    unknown  . SENSIPAR 30 MG tablet Take 30 mg by mouth daily with supper.    05/08/2016 at Unknown time  . simvastatin (ZOCOR) 20 MG tablet Take 1 tablet (20 mg total) by mouth at bedtime. 90 tablet 3 05/07/2016 at Unknown time   Scheduled: . amiodarone  200 mg Oral Daily  . aspirin EC  81 mg Oral Daily  . chlorhexidine gluconate (MEDLINE KIT)  15 mL Mouth Rinse BID  . chlorhexidine gluconate (MEDLINE KIT)  15 mL Mouth Rinse BID  . famotidine (PEPCID) IV  20 mg Intravenous Q24H  . heparin  5,000 Units Subcutaneous Q8H  . mouth rinse  15 mL Mouth Rinse QID  . mouth rinse  15 mL Mouth Rinse QID  . multivitamin   1 tablet Oral Daily  . NIFEdipine  30 mg Oral Daily   Continuous: . nitroGLYCERIN 80 mcg/min (05/14/16 0704)   RJJ:OACZYS chloride, sodium chloride, sodium chloride, sodium chloride, acetaminophen, acetaminophen, ALPRAZolam, fentaNYL (SUBLIMAZE) injection, fentaNYL (SUBLIMAZE) injection, fluticasone, ipratropium-albuterol, lidocaine (PF), lidocaine-prilocaine, midazolam, ondansetron (ZOFRAN) IV, pentafluoroprop-tetrafluoroeth  Assesment: She was admitted with hypertensive crisis. She has had CODE BLUE 2 during this hospitalization with problems with hyperkalemia. She  has end-stage renal disease on hemodialysis. She has acute respiratory failure related to all of the above. Although initially it looked like she might be able to be successfully weaned from the ventilator with her episode of being more poorly responsive I do not think that's going to be safe today. Principal Problem:   Hypertensive crisis Active Problems:   Coronary atherosclerosis of native coronary artery   ESRD on hemodialysis (HCC)   Anemia of chronic kidney failure   Chronic diastolic CHF (congestive heart failure) (HCC)   Chest pain   Acute respiratory failure ([place])   Hypertensive crisis without congestive heart failure   Cardiac arrest [facility])   Palliative care encounter   DNR (do not resuscitate) discussion   Goals of care, counseling/discussion    Plan: Continue ventilator support. No other changes.    LOS: 5 days   Erskine Steinfeldt L 05/14/2016, 9:57 AM

## 2016-05-14 NOTE — Progress Notes (Signed)
Patient is coughing up a large amount of yellow foamy secretions. She is due for dialysis today.

## 2016-05-15 LAB — BLOOD GAS, ARTERIAL
Bicarbonate: 28 mmol/L (ref 20.0–28.0)
DRAWN BY: 22223
FIO2: 35
MECHVT: 300 mL
O2 Saturation: 98.2 %
PEEP/CPAP: 5 cmH2O
PO2 ART: 143 mmHg — AB (ref 83.0–108.0)
RATE: 14 resp/min
pCO2 arterial: 41.7 mmHg (ref 32.0–48.0)
pH, Arterial: 7.443 (ref 7.350–7.450)

## 2016-05-15 LAB — BASIC METABOLIC PANEL
ANION GAP: 17 — AB (ref 5–15)
BUN: 40 mg/dL — ABNORMAL HIGH (ref 6–20)
CALCIUM: 9.3 mg/dL (ref 8.9–10.3)
CHLORIDE: 93 mmol/L — AB (ref 101–111)
CO2: 28 mmol/L (ref 22–32)
Creatinine, Ser: 6.62 mg/dL — ABNORMAL HIGH (ref 0.44–1.00)
GFR calc Af Amer: 6 mL/min — ABNORMAL LOW (ref >60)
GFR calc non Af Amer: 5 mL/min — ABNORMAL LOW (ref >60)
GLUCOSE: 92 mg/dL (ref 65–99)
Potassium: 3.8 mmol/L (ref 3.5–5.1)
Sodium: 138 mmol/L (ref 135–145)

## 2016-05-15 LAB — GLUCOSE, CAPILLARY
GLUCOSE-CAPILLARY: 102 mg/dL — AB (ref 65–99)
GLUCOSE-CAPILLARY: 103 mg/dL — AB (ref 65–99)
GLUCOSE-CAPILLARY: 108 mg/dL — AB (ref 65–99)
GLUCOSE-CAPILLARY: 79 mg/dL (ref 65–99)
Glucose-Capillary: 106 mg/dL — ABNORMAL HIGH (ref 65–99)
Glucose-Capillary: 102 mg/dL — ABNORMAL HIGH (ref 65–99)

## 2016-05-15 LAB — CBC
HCT: 31.7 % — ABNORMAL LOW (ref 36.0–46.0)
HEMOGLOBIN: 9.9 g/dL — AB (ref 12.0–15.0)
MCH: 31.2 pg (ref 26.0–34.0)
MCHC: 31.2 g/dL (ref 30.0–36.0)
MCV: 100 fL (ref 78.0–100.0)
Platelets: 285 10*3/uL (ref 150–400)
RBC: 3.17 MIL/uL — ABNORMAL LOW (ref 3.87–5.11)
RDW: 16.9 % — ABNORMAL HIGH (ref 11.5–15.5)
WBC: 9.5 10*3/uL (ref 4.0–10.5)

## 2016-05-15 NOTE — Progress Notes (Signed)
PROGRESS NOTE    Shawna [name]  VOH:[id number] DOB: Oct 30, 1940 DOA: 05/08/2016 PCP: [name], MD    Brief Narrative:  23 yof with a history of CAD and ESRD, presented with acute onset of chest pain unrelieved by aspirin and nitroglycerin. In the emergency department BP 230/130 and she was noted to have new ST depression. She was started on nitroglycerin infusion and IV labetalol and admitted for hypertensive crisis with chest pain.Her chest pain had resolved but she remained hypertensive. On 11/1 patient suddenly became bradycardic and unresponsive. She underwent CPR, chest compressions, received epinephrine and had ROSC. On 11/2 prior to starting her dialysis she was noted to be hypertensive so she was given IV labetalol and developed bradycardia and become unresponsive once again. The same actions were taken as previous. She is currently on ventilatory support.   Assessment & Plan:   Principal Problem:   Hypertensive crisis Active Problems:   Coronary atherosclerosis of native coronary artery   ESRD on hemodialysis (HCC)   Anemia of chronic kidney failure   Chronic diastolic CHF (congestive heart failure) (HCC)   Chest pain   Acute respiratory failure ([place])   Hypertensive crisis without congestive heart failure   Cardiac arrest [facility])   Palliative care encounter   DNR (do not resuscitate) discussion   Goals of care, counseling/discussion  1. Bradycardia with PEA cardiac arrest. On 11/1, patient's heart rate became bradycardic into PEA. She underwent CPR, received epinephrine, bicarbonate and calcium chloride. She had ROSC after approximately 25 mins of CPR. Lab work had indicated an elevated potassium of 6.4, which may have been cause. She was brieflystarted on a epinephrine infusion which was quicklyweaned off. CXR does not show any acute process. Cardiology following. On 11/2, prior to starting dialysis, patient was noted to be hypertensive, despite being on nitroglycerin  infusion. She received one dose of IV labetalol and subsequently developed bradycardia and went into PEA arrest. She received epinephrine, bicarbonate, and calcium chloride, as well as CPR for < 34mns and had ROSC. Currently, heart rate has been stable but elevated. ? Need for pacemaker. Would defer to cardiology  2. Acute respiratory failure. Ventilator dependent. Patient intubated while undergoing CPR. Pulmonology following. She is undergoing daily weaning trials. 3. Hypertensive crisis with chest pain. Patient was admitted with a blood pressure of 230/130 and chest pain. She was started on nitroglycerin and IV labetalol which have since been weaned off. Her chest pain had resolved. Blood pressures are currently stable. She becomes symptomatic from hypotension with sbp in the low 100s.. 4. Elevated troponin. Patient has a history of CAD status post CABG and stenting. Patient had no evidence of ACS on admission. Since that time, her troponin has started to rise. Cardiology following and felt that this may be demand ischemia/related to CPR. Troponin peak was 1.5 and has since trended down since.  5. ESRD on HD MWF. Nephrology following for dialysis needs.  6. Chronic diastolic congestive heart failure. Patient has an echocardiogram on 09/2015 with an EF of 669-48% grade 2 diastolic dysfunction. Continue fluid management with dialysis. 7. PSVT, maintained on amiodarone. EKG remains in sinus rhythm.  8. Myoclonus. Suspect secondary to CKD/ESRD on HD. No history or objective findings to suggest acute CNS process. Reviewed meds, no likely offenders. Monitor for now 9. Hyperkalemia. Resolved.  10. Elevated TSH. Recheck as an outpatient. 11. Anemia of chronic kidney disease. Hemoglobin has trended down to 8.9. There are no signs of active bleeding at this time. Hemoglobin has  improved to 9.9. Will continue to monitor.    DVT prophylaxis: Heparin injection  Code Status: Full  Family Communication: husband at  bedside Disposition Plan: Discharge home once improved.    Consultants:   Nephrology   Cardiology  Pulmonology  Nutrition   Procedures:   Intubation 11/1  Antimicrobials:  None    Subjective: Awake, intubated, following commands  Objective: Vitals:   05/15/16 0200 05/15/16 0300 05/15/16 0354 05/15/16 0400  BP: 127/75 (!) 136/113  (!) 139/57  Pulse: (!) 104 99  98  Resp: _0 Temp:    98.2 F (36.8 C)  TempSrc:    Oral  SpO2: 100% 100%  100%  Weight:    57.2 kg (126 lb 1.7 oz)  Height:   5' 1" (1.549 m)     Intake/Output Summary (Last 24 hours) at 05/15/16 0615 Last data filed at 05/14/16 1600  Gross per 24 hour  Intake              144 ml  Output             1500 ml  Net            -1356 ml   Filed Weights   05/14/16 0400 05/14/16 0815 05/15/16 0400  Weight: 58.4 kg (128 lb 12 oz) 58.4 kg (128 lb 12 oz) 57.2 kg (126 lb 1.7 oz)    Examination:  General exam: Appears calm and comfortable  Respiratory system: Clear to auscultation. Respiratory effort normal. Cardiovascular system: S1 & S2 heard, RRR. No JVD, murmurs, rubs, gallops or clicks. No pedal edema. Gastrointestinal system: Abdomen is nondistended, soft and nontender. No organomegaly or masses felt. Normal bowel sounds heard. Central nervous system: Alert and oriented. No focal neurological deficits. Extremities: Symmetric 5 x 5 power. Skin: No rashes, lesions or ulcers Psychiatry: cannot assess    Data Reviewed: I have personally reviewed following labs and imaging studies  CBC:  Recent Labs Lab 05/08/16 2326  05/12/16 0359 05/12/16 1404 05/13/16 0430 05/14/16 0408 05/15/16 0445  WBC 7.3  < > 10.1 18.0* 10.8* 9.6 9.5  NEUTROABS 5.1  --   --   --   --   --   --   HGB 10.5*  < > 10.8* 10.2* 9.9* 8.9* 9.9*  HCT 34.0*  < > 34.9* 33.0* 31.3* 28.2* 31.7*  MCV 101.8*  < > 100.0 100.6* 99.1 98.9 100.0  PLT 307  < > 260 253 218 230 285  < > = values in this interval not  displayed. Basic Metabolic Panel:  Recent Labs Lab 05/09/16 1430 05/11/16 0631 05/11/16 0817 05/11/16 1410 05/12/16 0359 05/12/16 1404 05/13/16 0430 05/14/16 0408  NA 135  135 131* 133* 137 135 139 138 140  K 3.9  3.9 6.4* 6.3* 4.0 5.6* 5.3* 4.4 4.4  CL 99*  99* 92* 92* 96* 95* 95* 96* 96*  CO2 _1 17* _2 GLUCOSE 112*  111* 89 235* 100* 90 162* 127* 108*  BUN 43*  42* 47* 50* 19 38* 49* 47* 70*  CREATININE 7.39*  7.56* 8.58* 9.09* 4.08* 6.35* 7.49* 6.74* 9.58*  CALCIUM 7.9*  7.9* 7.9* 10.5* 9.2 8.6* 11.0* 8.4* 8.6*  MG  --   --  2.0  --   --   --   --   --   PHOS 1.3* 7.0*  --  4.2  --   --  6.1* 7.3*  GFR: Estimated Creatinine Clearance: 3.8 mL/min (by C-G formula based on SCr of 9.58 mg/dL (H)). Liver Function Tests:  Recent Labs Lab 05/08/16 2326  05/11/16 0817 05/11/16 1410 05/12/16 0359 05/13/16 0430 05/14/16 0408  AST 18  --  711*  --  421*  --   --   ALT 13*  --  629*  --  529*  --   --   ALKPHOS 79  --  76  --  92  --   --   BILITOT 0.7  --  0.6  --  1.1  --   --   PROT 6.9  --  5.4*  --  6.5  --   --   ALBUMIN 3.7  < > 2.9* 3.6 3.4* 3.1* 3.0*  < > = values in this interval not displayed. No results for input(s): LIPASE, AMYLASE in the last 168 hours. No results for input(s): AMMONIA in the last 168 hours. Coagulation Profile:  Recent Labs Lab 05/08/16 2351  INR 0.92   Cardiac Enzymes:  Recent Labs Lab 05/09/16 1430 05/11/16 0817 05/11/16 1410 05/11/16 2010 05/12/16 0948  TROPONINI 0.05* 0.05* 1.27* 1.53* 0.66*   BNP (last 3 results) No results for input(s): PROBNP in the last 8760 hours. HbA1C: No results for input(s): HGBA1C in the last 72 hours. CBG:  Recent Labs Lab 05/14/16 1136 05/14/16 1703 05/14/16 2032 05/15/16 0008 05/15/16 0441  GLUCAP 87 92 94 103* 106*   Lipid Profile: No results for input(s): CHOL, HDL, LDLCALC, TRIG, CHOLHDL, LDLDIRECT in the last 72 hours. Thyroid Function Tests: No  results for input(s): TSH, T4TOTAL, FREET4, T3FREE, THYROIDAB in the last 72 hours. Anemia Panel: No results for input(s): VITAMINB12, FOLATE, FERRITIN, TIBC, IRON, RETICCTPCT in the last 72 hours. Sepsis Labs: No results for input(s): PROCALCITON, LATICACIDVEN in the last 168 hours.  Recent Results (from the past 240 hour(s))  MRSA PCR Screening     Status: None   Collection Time: 05/09/16  2:38 AM  Result Value Ref Range Status   MRSA by PCR NEGATIVE NEGATIVE Final    Comment:        The GeneXpert MRSA Assay (FDA approved for NASAL specimens only), is one component of a comprehensive MRSA colonization surveillance program. It is not intended to diagnose MRSA infection nor to guide or monitor treatment for MRSA infections.          Radiology Studies: US Venous Img Upper Uni Right  Result Date: 05/13/2016 CLINICAL DATA:  Recent placement of a tunneled catheter. Right upper extremity swelling. EXAM: RIGHT UPPER EXTREMITY VENOUS DOPPLER ULTRASOUND TECHNIQUE: Gray-scale sonography with graded compression, as well as color Doppler and duplex ultrasound were performed to evaluate the upper extremity deep venous system from the level of the subclavian vein and including the jugular, axillary, basilic and upper cephalic vein. Spectral Doppler was utilized to evaluate flow at rest and with distal augmentation maneuvers. COMPARISON:  None. FINDINGS: Normal color Doppler flow and phasicity in the left subclavian vein. There appears to an echogenic structure in the right internal jugular vein compatible with the tunneled right jugular central venous catheter. The right internal jugular vein does appear to be patent with phasicity. There is compressibility, color Doppler flow and phasicity in the right subclavian vein. Normal compressibility, color Doppler flow and augmentation in the right axillary vein. Normal compressibility, color Doppler flow and augmentation in the right brachial veins.  Radial veins appear to be patent. Ulnar veins are poorly visualized. Right cephalic vein demonstrates  normal compressibility and color Doppler flow. The right basilic vein is decompressed and there are linear echoes extending along the brachial vein which could represent an intravenous catheter or related to a prior PICC line placement. There is no flow within the right basilic vein. Other findings:  None visualized. IMPRESSION: No evidence for deep venous thrombosis in the right upper extremity. Thrombosis of the right basilic vein which is a superficial vein. In addition, there is evidence for either a prior catheter in this thrombosed vein or there is currently a catheter in this right basilic vein. Recommend clinical correlation. Electronically Signed   By: Markus Daft M.D.   On: 05/13/2016 15:30        Scheduled Meds: . amiodarone  200 mg Oral Daily  . aspirin EC  81 mg Oral Daily  . chlorhexidine gluconate (MEDLINE KIT)  15 mL Mouth Rinse BID  . chlorhexidine gluconate (MEDLINE KIT)  15 mL Mouth Rinse BID  . famotidine (PEPCID) IV  20 mg Intravenous Q24H  . heparin  5,000 Units Subcutaneous Q8H  . mouth rinse  15 mL Mouth Rinse QID  . mouth rinse  15 mL Mouth Rinse QID  . multivitamin  1 tablet Oral Daily  . NIFEdipine  30 mg Oral Daily   Continuous Infusions: . nitroGLYCERIN 80 mcg/min (05/14/16 2104)     LOS: 6 days    Time spent: 25 minutes     Kathie Dike, MD [facility] If 7PM-7AM, please contact night-coverage [email or web address] Password [email or web address] 05/15/2016, 6:15 AM

## 2016-05-15 NOTE — Progress Notes (Signed)
Subjective: She remains intubated and on the ventilator. She is awake this morning. It appears that when her blood pressure drops to approximately 283 systolic she has poor perfusion of her brain and becomes poorly responsive. She's had trouble with dialysis for some time because of hypotension. She has now however started having episodes of bradycardia. She has had CODE BLUE 2 while in hospital. She was actually admitted for hypertensive crisis.  Objective: Vital signs in last 24 hours: Temp:  [96.9 F (36.1 C)-100.3 F (37.9 C)] 98.2 F (36.8 C) (11/05 0400) Pulse Rate:  [89-104] 89 (11/05 0700) Resp:  [14-16] 14 (11/05 0700) BP: (115-167)/(57-117) 115/89 (11/05 0700) SpO2:  [100 %] 100 % (11/05 0700) FiO2 (%):  [35 %-40 %] 35 % (11/05 0354) Weight:  [57.2 kg (126 lb 1.7 oz)-58.4 kg (128 lb 12 oz)] 57.2 kg (126 lb 1.7 oz) (11/05 0400) Weight change: 0 kg (0 lb) Last BM Date: 05/08/16  Intake/Output from previous day: 11/04 0701 - 11/05 0700 In: 144 [I.V.:144] Out: 1500   PHYSICAL EXAM General appearance: alert and She is awake. She is intubated and on the ventilator. She does have sedation. Resp: alert and Normal respiratory rate lungs are clear Cardio: regular rate and rhythm, S1, S2 normal, no murmur, click, rub or gallop GI: soft, non-tender; bowel sounds normal; no masses,  no organomegaly Extremities: extremities normal, atraumatic, no cyanosis or edema Skin warm and dry. Pupils reactive. Mucous membranes are moist  Lab Results:  Results for orders placed or performed during the hospital encounter of 05/08/16 (from the past 48 hour(s))  Glucose, capillary     Status: Abnormal   Collection Time: 05/13/16  3:07 PM  Result Value Ref Range   Glucose-Capillary 125 (H) 65 - 99 mg/dL  Renal function panel     Status: Abnormal   Collection Time: 05/14/16  4:08 AM  Result Value Ref Range   Sodium 140 135 - 145 mmol/L   Potassium 4.4 3.5 - 5.1 mmol/L   Chloride 96 (L) 101 -  111 mmol/L   CO2 26 22 - 32 mmol/L   Glucose, Bld 108 (H) 65 - 99 mg/dL   BUN 70 (H) 6 - 20 mg/dL   Creatinine, Ser 9.58 (H) 0.44 - 1.00 mg/dL   Calcium 8.6 (L) 8.9 - 10.3 mg/dL   Phosphorus 7.3 (H) 2.5 - 4.6 mg/dL   Albumin 3.0 (L) 3.5 - 5.0 g/dL   GFR calc non Af Amer 3 (L) >60 mL/min   GFR calc Af Amer 4 (L) >60 mL/min    Comment: (NOTE) The eGFR has been calculated using the CKD EPI equation. This calculation has not been validated in all clinical situations. eGFR's persistently <60 mL/min signify possible Chronic Kidney Disease.    Anion gap 18 (H) 5 - 15  CBC     Status: Abnormal   Collection Time: 05/14/16  4:08 AM  Result Value Ref Range   WBC 9.6 4.0 - 10.5 K/uL   RBC 2.85 (L) 3.87 - 5.11 MIL/uL   Hemoglobin 8.9 (L) 12.0 - 15.0 g/dL   HCT 28.2 (L) 36.0 - 46.0 %   MCV 98.9 78.0 - 100.0 fL   MCH 31.2 26.0 - 34.0 pg   MCHC 31.6 30.0 - 36.0 g/dL   RDW 17.1 (H) 11.5 - 15.5 %   Platelets 230 150 - 400 K/uL  Blood gas, arterial     Status: Abnormal   Collection Time: 05/14/16  6:00 AM  Result Value Ref  Range   FIO2 40.00    Delivery systems VENTILATOR    Mode PRESSURE REGULATED VOLUME CONTROL    VT 380 mL   LHR 14 resp/min   Peep/cpap 5.0 cm H20   pH, Arterial 7.433 7.350 - 7.450   pCO2 arterial 41.0 32.0 - 48.0 mmHg   pO2, Arterial 143.0 (H) 83.0 - 108.0 mmHg   Bicarbonate 27.0 20.0 - 28.0 mmol/L   Acid-Base Excess 3.0 (H) 0.0 - 2.0 mmol/L   O2 Saturation 97.9 %   Collection site RADIAL    Drawn by [id number]    Sample type ARTERIAL    Allens test (pass/fail) PASS PASS  Glucose, capillary     Status: None   Collection Time: 05/14/16 11:36 AM  Result Value Ref Range   Glucose-Capillary 87 65 - 99 mg/dL  Glucose, capillary     Status: None   Collection Time: 05/14/16  5:03 PM  Result Value Ref Range   Glucose-Capillary 92 65 - 99 mg/dL  Glucose, capillary     Status: None   Collection Time: 05/14/16  8:32 PM  Result Value Ref Range   Glucose-Capillary 94 65 -  99 mg/dL   Comment 1 Notify RN    Comment 2 Document in Chart   Glucose, capillary     Status: Abnormal   Collection Time: 05/15/16 12:08 AM  Result Value Ref Range   Glucose-Capillary 103 (H) 65 - 99 mg/dL   Comment 1 Notify RN    Comment 2 Document in Chart   Blood gas, arterial     Status: Abnormal   Collection Time: 05/15/16  4:15 AM  Result Value Ref Range   FIO2 35.00    Delivery systems VENTILATOR    Mode PRESSURE REGULATED VOLUME CONTROL    VT 300 mL   LHR 14 resp/min   Peep/cpap 5.0 cm H20   pH, Arterial 7.443 7.350 - 7.450   pCO2 arterial 41.7 32.0 - 48.0 mmHg   pO2, Arterial 143.00 (H) 83.0 - 108.0 mmHg   Bicarbonate 28.0 20.0 - 28.0 mmol/L   O2 Saturation 98.2 %   Collection site RIGHT RADIAL    Drawn by [id number]    Sample type ARTERIAL    Allens test (pass/fail) PASS PASS  Glucose, capillary     Status: Abnormal   Collection Time: 05/15/16  4:41 AM  Result Value Ref Range   Glucose-Capillary 106 (H) 65 - 99 mg/dL   Comment 1 Notify RN    Comment 2 Document in Chart   Basic metabolic panel     Status: Abnormal   Collection Time: 05/15/16  4:45 AM  Result Value Ref Range   Sodium 138 135 - 145 mmol/L   Potassium 3.8 3.5 - 5.1 mmol/L   Chloride 93 (L) 101 - 111 mmol/L   CO2 28 22 - 32 mmol/L   Glucose, Bld 92 65 - 99 mg/dL   BUN 40 (H) 6 - 20 mg/dL   Creatinine, Ser 6.62 (H) 0.44 - 1.00 mg/dL   Calcium 9.3 8.9 - 10.3 mg/dL   GFR calc non Af Amer 5 (L) >60 mL/min   GFR calc Af Amer 6 (L) >60 mL/min    Comment: (NOTE) The eGFR has been calculated using the CKD EPI equation. This calculation has not been validated in all clinical situations. eGFR's persistently <60 mL/min signify possible Chronic Kidney Disease.    Anion gap 17 (H) 5 - 15  CBC     Status: Abnormal  Collection Time: 05/15/16  4:45 AM  Result Value Ref Range   WBC 9.5 4.0 - 10.5 K/uL   RBC 3.17 (L) 3.87 - 5.11 MIL/uL   Hemoglobin 9.9 (L) 12.0 - 15.0 g/dL   HCT 31.7 (L) 36.0 - 46.0 %    MCV 100.0 78.0 - 100.0 fL   MCH 31.2 26.0 - 34.0 pg   MCHC 31.2 30.0 - 36.0 g/dL   RDW 16.9 (H) 11.5 - 15.5 %   Platelets 285 150 - 400 K/uL    ABGS  Recent Labs  05/15/16 0415  PHART 7.443  PO2ART 143.00*  HCO3 28.0   CULTURES Recent Results (from the past 240 hour(s))  MRSA PCR Screening     Status: None   Collection Time: 05/09/16  2:38 AM  Result Value Ref Range Status   MRSA by PCR NEGATIVE NEGATIVE Final    Comment:        The GeneXpert MRSA Assay (FDA approved for NASAL specimens only), is one component of a comprehensive MRSA colonization surveillance program. It is not intended to diagnose MRSA infection nor to guide or monitor treatment for MRSA infections.    Studies/Results: US Venous Img Upper Uni Right  Result Date: 05/13/2016 CLINICAL DATA:  Recent placement of a tunneled catheter. Right upper extremity swelling. EXAM: RIGHT UPPER EXTREMITY VENOUS DOPPLER ULTRASOUND TECHNIQUE: Gray-scale sonography with graded compression, as well as color Doppler and duplex ultrasound were performed to evaluate the upper extremity deep venous system from the level of the subclavian vein and including the jugular, axillary, basilic and upper cephalic vein. Spectral Doppler was utilized to evaluate flow at rest and with distal augmentation maneuvers. COMPARISON:  None. FINDINGS: Normal color Doppler flow and phasicity in the left subclavian vein. There appears to an echogenic structure in the right internal jugular vein compatible with the tunneled right jugular central venous catheter. The right internal jugular vein does appear to be patent with phasicity. There is compressibility, color Doppler flow and phasicity in the right subclavian vein. Normal compressibility, color Doppler flow and augmentation in the right axillary vein. Normal compressibility, color Doppler flow and augmentation in the right brachial veins. Radial veins appear to be patent. Ulnar veins are poorly  visualized. Right cephalic vein demonstrates normal compressibility and color Doppler flow. The right basilic vein is decompressed and there are linear echoes extending along the brachial vein which could represent an intravenous catheter or related to a prior PICC line placement. There is no flow within the right basilic vein. Other findings:  None visualized. IMPRESSION: No evidence for deep venous thrombosis in the right upper extremity. Thrombosis of the right basilic vein which is a superficial vein. In addition, there is evidence for either a prior catheter in this thrombosed vein or there is currently a catheter in this right basilic vein. Recommend clinical correlation. Electronically Signed   By: Markus Daft M.D.   On: 05/13/2016 15:30    Medications:  Prior to Admission:  Prescriptions Prior to Admission  Medication Sig Dispense Refill Last Dose  . ALPRAZolam (XANAX) 0.25 MG tablet Take 1-2 tablets by mouth at bedtime as needed for sleep.    05/08/2016 at Unknown time  . amiodarone (PACERONE) 200 MG tablet Take 1 tablet (200 mg total) by mouth daily. 30 tablet 3 05/08/2016 at Unknown time  . aspirin EC 81 MG tablet Take 81 mg by mouth daily.    05/08/2016 at Unknown time  . cloNIDine (CATAPRES) 0.1 MG tablet Take 1 tablet (  0.1 mg total) by mouth 2 (two) times daily. 60 tablet 11 05/08/2016 at Unknown time  . epoetin alfa (EPOGEN,PROCRIT) 23343 UNIT/ML injection Inject 1 mL (10,000 Units total) into the vein every Monday, Wednesday, and Friday with hemodialysis. 1 mL 10 05/06/2016 at Unknown time  . fluticasone (FLONASE) 50 MCG/ACT nasal spray Place 2 sprays into the nose daily as needed for allergies.    unknown  . labetalol (NORMODYNE) 200 MG tablet Take 1 tablet (200 mg total) by mouth 2 (two) times daily. 90 tablet 1 05/08/2016 at Unknown time  . lanthanum (FOSRENOL) 1000 MG chewable tablet Chew 1 tablet (1,000 mg total) by mouth 3 (three) times daily with meals. 90 tablet 1 05/08/2016 at  Unknown time  . multivitamin (RENA-VIT) TABS tablet Take 1 tablet by mouth daily.   05/08/2016 at Unknown time  . nitroGLYCERIN (NITROSTAT) 0.4 MG SL tablet Place 1 tablet (0.4 mg total) under the tongue every 5 (five) minutes as needed for chest pain. 25 tablet 3 05/08/2016 at Unknown time  . omeprazole (PRILOSEC) 20 MG capsule TAKE ONE CAPSULE BY MOUTH ONCE DAILY 30 capsule 5 05/08/2016 at Unknown time  . PROAIR HFA 108 (90 BASE) MCG/ACT inhaler Inhale 1 puff into the lungs every 6 (six) hours as needed for wheezing or shortness of breath.    unknown  . SENSIPAR 30 MG tablet Take 30 mg by mouth daily with supper.    05/08/2016 at Unknown time  . simvastatin (ZOCOR) 20 MG tablet Take 1 tablet (20 mg total) by mouth at bedtime. 90 tablet 3 05/07/2016 at Unknown time   Scheduled: . amiodarone  200 mg Oral Daily  . aspirin EC  81 mg Oral Daily  . chlorhexidine gluconate (MEDLINE KIT)  15 mL Mouth Rinse BID  . chlorhexidine gluconate (MEDLINE KIT)  15 mL Mouth Rinse BID  . famotidine (PEPCID) IV  20 mg Intravenous Q24H  . heparin  5,000 Units Subcutaneous Q8H  . mouth rinse  15 mL Mouth Rinse QID  . mouth rinse  15 mL Mouth Rinse QID  . multivitamin  1 tablet Oral Daily  . NIFEdipine  30 mg Oral Daily   Continuous: . nitroGLYCERIN 80 mcg/min (05/14/16 2104)   HWY:SHUOHF chloride, sodium chloride, sodium chloride, sodium chloride, acetaminophen, acetaminophen, ALPRAZolam, fentaNYL (SUBLIMAZE) injection, fentaNYL (SUBLIMAZE) injection, fluticasone, ipratropium-albuterol, lidocaine (PF), lidocaine-prilocaine, midazolam, ondansetron (ZOFRAN) IV, pentafluoroprop-tetrafluoroeth  Assesment: She was admitted with hypertensive crisis. She has end-stage renal disease on hemodialysis and had hyperkalemia. She's had CODE BLUE 2. She was intubated at the time of the first [place] and although she looks okay as far as her respiratory status is concerned she said repeated episodes of being poorly  responsive and we've hesitated about pulling the endotracheal tube out. She still does want to have full measures. Principal Problem:   Hypertensive crisis Active Problems:   Coronary atherosclerosis of native coronary artery   ESRD on hemodialysis (HCC)   Anemia of chronic kidney failure   Chronic diastolic CHF (congestive heart failure) (HCC)   Chest pain   Acute respiratory failure ([place])   Hypertensive crisis without congestive heart failure   Cardiac arrest [facility])   Palliative care encounter   DNR (do not resuscitate) discussion   Goals of care, counseling/discussion    Plan: Continue current treatments.    LOS: 6 days   Angelito Hopping L 05/15/2016, 7:29 AM

## 2016-05-15 NOTE — Progress Notes (Signed)
Subjective: Interval History: Patient remained intubated. Presently she denies any chest pain. Patient is alert and responding to questions by hand movement.  Objective: Vital signs in last 24 hours: Temp:  [96.8 F (36 C)-100.3 F (37.9 C)] 96.8 F (36 C) (11/05 0800) Pulse Rate:  [89-105] 105 (11/05 0800) Resp:  [14-16] 16 (11/05 0800) BP: (115-167)/(57-117) 154/78 (11/05 0800) SpO2:  [100 %] 100 % (11/05 0800) FiO2 (%):  [35 %-40 %] 35 % (11/05 0354) Weight:  [57.2 kg (126 lb 1.7 oz)] 57.2 kg (126 lb 1.7 oz) (11/05 0400) Weight change: 0 kg (0 lb)  Intake/Output from previous day: 11/04 0701 - 11/05 0700 In: 144 [I.V.:144] Out: 1500  Intake/Output this shift: No intake/output data recorded.  Patient is presently alert but remained intubated.  Chest: Inspiratory crackles Heart: RRR  Extremties: No edema  Lab Results:  Recent Labs  05/14/16 0408 05/15/16 0445  WBC 9.6 9.5  HGB 8.9* 9.9*  HCT 28.2* 31.7*  PLT 230 285   BMET:   Recent Labs  05/14/16 0408 05/15/16 0445  NA 140 138  K 4.4 3.8  CL 96* 93*  CO2 26 28  GLUCOSE 108* 92  BUN 70* 40*  CREATININE 9.58* 6.62*  CALCIUM 8.6* 9.3   No results for input(s): PTH in the last 72 hours. Iron Studies: No results for input(s): IRON, TIBC, TRANSFERRIN, FERRITIN in the last 72 hours.  Studies/Results: US Venous Img Upper Uni Right  Result Date: 05/13/2016 CLINICAL DATA:  Recent placement of a tunneled catheter. Right upper extremity swelling. EXAM: RIGHT UPPER EXTREMITY VENOUS DOPPLER ULTRASOUND TECHNIQUE: Gray-scale sonography with graded compression, as well as color Doppler and duplex ultrasound were performed to evaluate the upper extremity deep venous system from the level of the subclavian vein and including the jugular, axillary, basilic and upper cephalic vein. Spectral Doppler was utilized to evaluate flow at rest and with distal augmentation maneuvers. COMPARISON:  None. FINDINGS: Normal color Doppler  flow and phasicity in the left subclavian vein. There appears to an echogenic structure in the right internal jugular vein compatible with the tunneled right jugular central venous catheter. The right internal jugular vein does appear to be patent with phasicity. There is compressibility, color Doppler flow and phasicity in the right subclavian vein. Normal compressibility, color Doppler flow and augmentation in the right axillary vein. Normal compressibility, color Doppler flow and augmentation in the right brachial veins. Radial veins appear to be patent. Ulnar veins are poorly visualized. Right cephalic vein demonstrates normal compressibility and color Doppler flow. The right basilic vein is decompressed and there are linear echoes extending along the brachial vein which could represent an intravenous catheter or related to a prior PICC line placement. There is no flow within the right basilic vein. Other findings:  None visualized. IMPRESSION: No evidence for deep venous thrombosis in the right upper extremity. Thrombosis of the right basilic vein which is a superficial vein. In addition, there is evidence for either a prior catheter in this thrombosed vein or there is currently a catheter in this right basilic vein. Recommend clinical correlation. Electronically Signed   By: Markus Daft M.D.   On: 05/13/2016 15:30    I have reviewed the patient's current medications.  Assessment/Plan: Problem #1 patient with episode of bradycardia/unresponsive/status post CODE BLUE and intubated. Problem #2 hypertension: Her blood pressure is slightly high but stable. Patient doesn't seem to tolerate normal blood pressure. Yesterday she passed out with systolic blood pressure 237 for a shorter  period. Problem #3 end-stage renal disease: She is status post hemodialysis yesterday. Her potassium is normal. Problem #5 anemia: Her hemoglobin is below our target goal. Presently she is on Epogen. Her hemoglobin is  improving. Problem #6 fluid management: Patient status post hemodialysis the day before yesterday presently she doesn't have any significant sign of fluid overload.  Problem #7 metabolic bone disease: Her calcium is normal but her phosphorus is high. Not getting binder because of intubation. Plan: 1]Will continue with dialysis 2] will check renal panel in the morning 3] will make a decision in the morning if she needs dialysis.   LOS: 6 days   Shawna [name] S 05/15/2016,8:41 AM

## 2016-05-16 ENCOUNTER — Ambulatory Visit (INDEPENDENT_AMBULATORY_CARE_PROVIDER_SITE_OTHER): Payer: Medicare HMO | Admitting: Internal Medicine

## 2016-05-16 LAB — BASIC METABOLIC PANEL
BUN: 70 mg/dL — AB (ref 6–20)
CO2: 26 mmol/L (ref 22–32)
CREATININE: 9.57 mg/dL — AB (ref 0.44–1.00)
Calcium: 9.2 mg/dL (ref 8.9–10.3)
Chloride: 93 mmol/L — ABNORMAL LOW (ref 101–111)
GFR calc Af Amer: 4 mL/min — ABNORMAL LOW (ref >60)
GFR, EST NON AFRICAN AMERICAN: 3 mL/min — AB (ref >60)
Glucose, Bld: 85 mg/dL (ref 65–99)
POTASSIUM: 3.9 mmol/L (ref 3.5–5.1)
Sodium: 140 mmol/L (ref 135–145)

## 2016-05-16 LAB — CBC
HCT: 33.5 % — ABNORMAL LOW (ref 36.0–46.0)
Hemoglobin: 10.4 g/dL — ABNORMAL LOW (ref 12.0–15.0)
MCH: 30.8 pg (ref 26.0–34.0)
MCHC: 31 g/dL (ref 30.0–36.0)
MCV: 99.1 fL (ref 78.0–100.0)
PLATELETS: 304 10*3/uL (ref 150–400)
RBC: 3.38 MIL/uL — ABNORMAL LOW (ref 3.87–5.11)
RDW: 16.9 % — AB (ref 11.5–15.5)
WBC: 11.4 10*3/uL — AB (ref 4.0–10.5)

## 2016-05-16 LAB — GLUCOSE, CAPILLARY
Glucose-Capillary: 85 mg/dL (ref 65–99)
Glucose-Capillary: 86 mg/dL (ref 65–99)
Glucose-Capillary: 87 mg/dL (ref 65–99)
Glucose-Capillary: 88 mg/dL (ref 65–99)
Glucose-Capillary: 90 mg/dL (ref 65–99)
Glucose-Capillary: 92 mg/dL (ref 65–99)
Glucose-Capillary: 98 mg/dL (ref 65–99)

## 2016-05-16 MED FILL — Medication: Qty: 1 | Status: AC

## 2016-05-16 NOTE — Progress Notes (Signed)
Pt awakes and can nod appropriately to questions and follows commands well. Pt exhibits obvious anxiety between doses of PRN medication.

## 2016-05-16 NOTE — Progress Notes (Signed)
Subjective: She is awake. No indication of pain. No other new problems have been noted overnight. No nausea or vomiting. No severe episodes of hypotension  Objective: Vital signs in last 24 hours: Temp:  [96.8 F (36 C)-99.4 F (37.4 C)] 97.9 F (36.6 C) (11/06 0727) Pulse Rate:  [83-105] 90 (11/06 0727) Resp:  [13-21] 13 (11/06 0727) BP: (81-176)/(52-86) 118/66 (11/06 0500) SpO2:  [100 %] 100 % (11/06 0727) FiO2 (%):  [35 %] 35 % (11/06 0347) Weight:  [57.3 kg (126 lb 5.2 oz)] 57.3 kg (126 lb 5.2 oz) (11/06 0400) Weight change: -1.1 kg (-2 lb 6.8 oz) Last BM Date: 05/08/16  Intake/Output from previous day: No intake/output data recorded.  PHYSICAL EXAM General appearance: alert and Intubated sedated on mechanical ventilation Resp: rhonchi bilaterally Cardio: regular rate and rhythm, S1, S2 normal, no murmur, click, rub or gallop GI: soft, non-tender; bowel sounds normal; no masses,  no organomegaly Extremities: extremities normal, atraumatic, no cyanosis or edema Skin warm and dry. Mucous membranes are moist  Lab Results:  Results for orders placed or performed during the hospital encounter of 05/08/16 (from the past 48 hour(s))  Glucose, capillary     Status: None   Collection Time: 05/14/16 11:36 AM  Result Value Ref Range   Glucose-Capillary 87 65 - 99 mg/dL  Glucose, capillary     Status: None   Collection Time: 05/14/16  5:03 PM  Result Value Ref Range   Glucose-Capillary 92 65 - 99 mg/dL  Glucose, capillary     Status: None   Collection Time: 05/14/16  8:32 PM  Result Value Ref Range   Glucose-Capillary 94 65 - 99 mg/dL   Comment 1 Notify RN    Comment 2 Document in Chart   Glucose, capillary     Status: Abnormal   Collection Time: 05/15/16 12:08 AM  Result Value Ref Range   Glucose-Capillary 103 (H) 65 - 99 mg/dL   Comment 1 Notify RN    Comment 2 Document in Chart   Blood gas, arterial     Status: Abnormal   Collection Time: 05/15/16  4:15 AM  Result  Value Ref Range   FIO2 35.00    Delivery systems VENTILATOR    Mode PRESSURE REGULATED VOLUME CONTROL    VT 300 mL   LHR 14 resp/min   Peep/cpap 5.0 cm H20   pH, Arterial 7.443 7.350 - 7.450   pCO2 arterial 41.7 32.0 - 48.0 mmHg   pO2, Arterial 143.00 (H) 83.0 - 108.0 mmHg   Bicarbonate 28.0 20.0 - 28.0 mmol/L   O2 Saturation 98.2 %   Collection site RIGHT RADIAL    Drawn by [id number]    Sample type ARTERIAL    Allens test (pass/fail) PASS PASS  Glucose, capillary     Status: Abnormal   Collection Time: 05/15/16  4:41 AM  Result Value Ref Range   Glucose-Capillary 106 (H) 65 - 99 mg/dL   Comment 1 Notify RN    Comment 2 Document in Chart   Basic metabolic panel     Status: Abnormal   Collection Time: 05/15/16  4:45 AM  Result Value Ref Range   Sodium 138 135 - 145 mmol/L   Potassium 3.8 3.5 - 5.1 mmol/L   Chloride 93 (L) 101 - 111 mmol/L   CO2 28 22 - 32 mmol/L   Glucose, Bld 92 65 - 99 mg/dL   BUN 40 (H) 6 - 20 mg/dL   Creatinine, Ser 6.62 (H) 0.44 -  1.00 mg/dL   Calcium 9.3 8.9 - 10.3 mg/dL   GFR calc non Af Amer 5 (L) >60 mL/min   GFR calc Af Amer 6 (L) >60 mL/min    Comment: (NOTE) The eGFR has been calculated using the CKD EPI equation. This calculation has not been validated in all clinical situations. eGFR's persistently <60 mL/min signify possible Chronic Kidney Disease.    Anion gap 17 (H) 5 - 15  CBC     Status: Abnormal   Collection Time: 05/15/16  4:45 AM  Result Value Ref Range   WBC 9.5 4.0 - 10.5 K/uL   RBC 3.17 (L) 3.87 - 5.11 MIL/uL   Hemoglobin 9.9 (L) 12.0 - 15.0 g/dL   HCT 31.7 (L) 36.0 - 46.0 %   MCV 100.0 78.0 - 100.0 fL   MCH 31.2 26.0 - 34.0 pg   MCHC 31.2 30.0 - 36.0 g/dL   RDW 16.9 (H) 11.5 - 15.5 %   Platelets 285 150 - 400 K/uL  Glucose, capillary     Status: Abnormal   Collection Time: 05/15/16  8:14 AM  Result Value Ref Range   Glucose-Capillary 102 (H) 65 - 99 mg/dL  Glucose, capillary     Status: Abnormal   Collection Time:  05/15/16 12:04 PM  Result Value Ref Range   Glucose-Capillary 108 (H) 65 - 99 mg/dL  Glucose, capillary     Status: Abnormal   Collection Time: 05/15/16  5:51 PM  Result Value Ref Range   Glucose-Capillary 102 (H) 65 - 99 mg/dL  Glucose, capillary     Status: None   Collection Time: 05/15/16  8:34 PM  Result Value Ref Range   Glucose-Capillary 79 65 - 99 mg/dL   Comment 1 Notify RN    Comment 2 Document in Chart   Glucose, capillary     Status: None   Collection Time: 05/16/16 12:11 AM  Result Value Ref Range   Glucose-Capillary 92 65 - 99 mg/dL   Comment 1 Notify RN    Comment 2 Document in Chart   Glucose, capillary     Status: None   Collection Time: 05/16/16  4:24 AM  Result Value Ref Range   Glucose-Capillary 87 65 - 99 mg/dL  Glucose, capillary     Status: None   Collection Time: 05/16/16  4:26 AM  Result Value Ref Range   Glucose-Capillary 85 65 - 99 mg/dL   Comment 1 Notify RN    Comment 2 Document in Chart   Basic metabolic panel     Status: Abnormal   Collection Time: 05/16/16  4:35 AM  Result Value Ref Range   Sodium 140 135 - 145 mmol/L   Potassium 3.9 3.5 - 5.1 mmol/L   Chloride 93 (L) 101 - 111 mmol/L   CO2 26 22 - 32 mmol/L   Glucose, Bld 85 65 - 99 mg/dL   BUN 70 (H) 6 - 20 mg/dL   Creatinine, Ser 9.57 (H) 0.44 - 1.00 mg/dL   Calcium 9.2 8.9 - 10.3 mg/dL   GFR calc non Af Amer 3 (L) >60 mL/min   GFR calc Af Amer 4 (L) >60 mL/min    Comment: (NOTE) The eGFR has been calculated using the CKD EPI equation. This calculation has not been validated in all clinical situations. eGFR's persistently <60 mL/min signify possible Chronic Kidney Disease.   CBC     Status: Abnormal   Collection Time: 05/16/16  4:35 AM  Result Value Ref Range  WBC 11.4 (H) 4.0 - 10.5 K/uL   RBC 3.38 (L) 3.87 - 5.11 MIL/uL   Hemoglobin 10.4 (L) 12.0 - 15.0 g/dL   HCT 33.5 (L) 36.0 - 46.0 %   MCV 99.1 78.0 - 100.0 fL   MCH 30.8 26.0 - 34.0 pg   MCHC 31.0 30.0 - 36.0 g/dL   RDW  16.9 (H) 11.5 - 15.5 %   Platelets 304 150 - 400 K/uL  Glucose, capillary     Status: None   Collection Time: 05/16/16  7:26 AM  Result Value Ref Range   Glucose-Capillary 86 65 - 99 mg/dL    ABGS  Recent Labs  05/15/16 0415  PHART 7.443  PO2ART 143.00*  HCO3 28.0   CULTURES Recent Results (from the past 240 hour(s))  MRSA PCR Screening     Status: None   Collection Time: 05/09/16  2:38 AM  Result Value Ref Range Status   MRSA by PCR NEGATIVE NEGATIVE Final    Comment:        The GeneXpert MRSA Assay (FDA approved for NASAL specimens only), is one component of a comprehensive MRSA colonization surveillance program. It is not intended to diagnose MRSA infection nor to guide or monitor treatment for MRSA infections.    Studies/Results: No results found.  Medications:  Prior to Admission:  Prescriptions Prior to Admission  Medication Sig Dispense Refill Last Dose  . ALPRAZolam (XANAX) 0.25 MG tablet Take 1-2 tablets by mouth at bedtime as needed for sleep.    05/08/2016 at Unknown time  . amiodarone (PACERONE) 200 MG tablet Take 1 tablet (200 mg total) by mouth daily. 30 tablet 3 05/08/2016 at Unknown time  . aspirin EC 81 MG tablet Take 81 mg by mouth daily.    05/08/2016 at Unknown time  . cloNIDine (CATAPRES) 0.1 MG tablet Take 1 tablet (0.1 mg total) by mouth 2 (two) times daily. 60 tablet 11 05/08/2016 at Unknown time  . epoetin alfa (EPOGEN,PROCRIT) 62952 UNIT/ML injection Inject 1 mL (10,000 Units total) into the vein every Monday, Wednesday, and Friday with hemodialysis. 1 mL 10 05/06/2016 at Unknown time  . fluticasone (FLONASE) 50 MCG/ACT nasal spray Place 2 sprays into the nose daily as needed for allergies.    unknown  . labetalol (NORMODYNE) 200 MG tablet Take 1 tablet (200 mg total) by mouth 2 (two) times daily. 90 tablet 1 05/08/2016 at Unknown time  . lanthanum (FOSRENOL) 1000 MG chewable tablet Chew 1 tablet (1,000 mg total) by mouth 3 (three) times  daily with meals. 90 tablet 1 05/08/2016 at Unknown time  . multivitamin (RENA-VIT) TABS tablet Take 1 tablet by mouth daily.   05/08/2016 at Unknown time  . nitroGLYCERIN (NITROSTAT) 0.4 MG SL tablet Place 1 tablet (0.4 mg total) under the tongue every 5 (five) minutes as needed for chest pain. 25 tablet 3 05/08/2016 at Unknown time  . omeprazole (PRILOSEC) 20 MG capsule TAKE ONE CAPSULE BY MOUTH ONCE DAILY 30 capsule 5 05/08/2016 at Unknown time  . PROAIR HFA 108 (90 BASE) MCG/ACT inhaler Inhale 1 puff into the lungs every 6 (six) hours as needed for wheezing or shortness of breath.    unknown  . SENSIPAR 30 MG tablet Take 30 mg by mouth daily with supper.    05/08/2016 at Unknown time  . simvastatin (ZOCOR) 20 MG tablet Take 1 tablet (20 mg total) by mouth at bedtime. 90 tablet 3 05/07/2016 at Unknown time   Scheduled: . amiodarone  200 mg  Oral Daily  . aspirin EC  81 mg Oral Daily  . chlorhexidine gluconate (MEDLINE KIT)  15 mL Mouth Rinse BID  . chlorhexidine gluconate (MEDLINE KIT)  15 mL Mouth Rinse BID  . famotidine (PEPCID) IV  20 mg Intravenous Q24H  . heparin  5,000 Units Subcutaneous Q8H  . mouth rinse  15 mL Mouth Rinse QID  . mouth rinse  15 mL Mouth Rinse QID  . multivitamin  1 tablet Oral Daily  . NIFEdipine  30 mg Oral Daily   Continuous:  RKV:TXLEZV chloride, sodium chloride, sodium chloride, sodium chloride, acetaminophen, acetaminophen, ALPRAZolam, fentaNYL (SUBLIMAZE) injection, fentaNYL (SUBLIMAZE) injection, fluticasone, ipratropium-albuterol, lidocaine (PF), lidocaine-prilocaine, midazolam, ondansetron (ZOFRAN) IV, pentafluoroprop-tetrafluoroeth  Assesment: She was admitted with hypertensive crisis. She has trouble when her blood pressure gets below about 471 or so systolic and she starts getting problems with central nervous system perfusion and becomes very sleepy. She has now started having bradycardia associated with that. She said CODE BLUE 2. She has remained  intubated since then. Discussed with nephrology and plan is to hold on dialysis today and see if we can get her off the ventilator. She has end-stage renal disease on dialysis. She has history of coronary artery occlusive disease. Principal Problem:   Hypertensive crisis Active Problems:   Coronary atherosclerosis of native coronary artery   ESRD on hemodialysis (HCC)   Anemia of chronic kidney failure   Chronic diastolic CHF (congestive heart failure) (HCC)   Chest pain   Acute respiratory failure ([place])   Hypertensive crisis without congestive heart failure   Cardiac arrest [facility])   Palliative care encounter   DNR (do not resuscitate) discussion   Goals of care, counseling/discussion    Plan: Attempt weaning today. No change in medications.    LOS: 7 days   Glendine Swetz L 05/16/2016, 7:40 AM

## 2016-05-16 NOTE — Progress Notes (Signed)
Attempted to wean via Cpap/PSV at this time. Patient was only getting a VT of 150-160 with 5/5. I increased to PS to 10 and her volumes increased to 350-360. At this time she was only breathing 8bpm and her VE was too low at 2.6. She is hemodynamically unstable as well at this point with a BP of 92/38. The trial was stopped due to these few key factors. RT will continue to monitor for additonal weaning.

## 2016-05-16 NOTE — Progress Notes (Signed)
cpap stopped at this time due to Apnea and reattempt x3.

## 2016-05-16 NOTE — Progress Notes (Signed)
PROGRESS NOTE    Shawna [name]  NWG:[id number] DOB: 1940-11-09 DOA: 05/08/2016 PCP: [name], MD    Brief Narrative:  33 yof with a history of CAD and ESRD, presented with acute onset of chest pain unrelieved by aspirin and nitroglycerin. In the emergency department BP 230/130 and she was noted to have new ST depression. She was started on nitroglycerin infusion and IV labetalol and admitted for hypertensive crisis with chest pain.Her chest pain had resolved but she remained hypertensive. On 11/1 patient suddenly became bradycardic and unresponsive. She underwent CPR, chest compressions, received epinephrine and had ROSC. On 11/2 prior to starting her dialysis she was noted to be hypertensive so she was given IV labetalol and developed bradycardia and become unresponsive once again. The same actions were taken as previous.She is currently on ventilatory support.    Assessment & Plan:   Principal Problem:   Hypertensive crisis Active Problems:   Coronary atherosclerosis of native coronary artery   ESRD on hemodialysis (HCC)   Anemia of chronic kidney failure   Chronic diastolic CHF (congestive heart failure) (HCC)   Chest pain   Acute respiratory failure ([place])   Hypertensive crisis without congestive heart failure   Cardiac arrest [facility])   Palliative care encounter   DNR (do not resuscitate) discussion   Goals of care, counseling/discussion  1. Bradycardia with PEA cardiac arrest. On 11/1, patient's heart rate became bradycardic into PEA. She underwent CPR, received epinephrine, bicarbonate and calcium chloride. She had ROSC after approximately 25 mins of CPR. Lab work had indicated an elevated potassium of 6.4, which may have been cause. She was brieflystarted on a epinephrine infusion which was quicklyweaned off. CXR did not show any acute process. Cardiology following. On 11/2, prior to starting dialysis, patient was noted to be hypertensive, despite being on nitroglycerin  infusion. She received one dose of IV labetalol and subsequently developed bradycardia and went into PEA arrest. She received epinephrine, bicarbonate, and calcium chloride, as well as CPR for <43mns and had ROSC. Currently, heart rate has been stable. ? The need for pacemaker. Would defer to cardiology  2. Acute respiratory failure. Ventilator dependent. Patient intubated while undergoing CPR. Pulmonology following. She is undergoing daily weaning trials. 3. Hypertensive crisis with chest pain. Patient was admitted with a blood pressure of 230/130 and chest pain. She was started on nitroglycerin and IV labetalol which have since been weaned off. Her chest pain had resolved. Blood pressures are currently stable. She becomes symptomatic from hypotension with sbp in the low 100s and passes out. 4. Elevated troponin. Patient has a history of CAD status post CABG and stenting. Patient had no evidence of ACS on admission. Since that time, her troponin has started to rise. Cardiology following and felt that this may be demand ischemia/related to CPR. Troponin peak was 1.5 and has since trended down since.  5. ESRD on HD MWF. Nephrology is following for dialysis needs.  6. Chronic diastolic congestive heart failure. Patient has an echocardiogram on 09/2015 with an EF of 657-84% grade 2 diastolic dysfunction. Continue fluid management with dialysis. 7. PSVT, maintained on amiodarone. EKG remains in sinus rhythm.  8. Myoclonus. Suspect secondary to CKD/ESRD on HD. No history or objective findings to suggest acute CNS process. Reviewed meds, no likely offenders. Monitor for now 9. Hyperkalemia. Resolved.  10. Elevated TSH. Recheck as an outpatient. 11. Anemia of chronic kidney disease. Hemoglobin has trended down to 8.9. There are no signs of active bleeding at this time.  Hemoglobin has improved to 10.4. Will continue to monitor.    DVT prophylaxis: Heparin injection  Code Status: FULL  Family Communication:  husband at bedside Disposition Plan: Discharge home once improved.    Consultants:   Nephrology   Cardiology  Pulmonology  Nutrition   Procedures:   Intubation 11/1 >>  Antimicrobials:   None    Subjective: Intubated and sedated   Objective: Vitals:   05/16/16 0300 05/16/16 0347 05/16/16 0400 05/16/16 0500  BP: (!) 150/82  (!) 143/72 118/66  Pulse: 96  88 87  Resp: _0 Temp:   97.4 F (36.3 C)   TempSrc:   Oral   SpO2: 100%  100% 100%  Weight:   57.3 kg (126 lb 5.2 oz)   Height:  _1  (1.549 m)      Intake/Output Summary (Last 24 hours) at 05/16/16 0621 Last data filed at 05/16/16 0300  Gross per 24 hour  Intake                0 ml  Output                0 ml  Net                0 ml   Filed Weights   05/14/16 0815 05/15/16 0400 05/16/16 0400  Weight: 58.4 kg (128 lb 12 oz) 57.2 kg (126 lb 1.7 oz) 57.3 kg (126 lb 5.2 oz)    Examination:  General exam: Appears calm and comfortable  Respiratory system: Clear to auscultation. Respiratory effort normal. Cardiovascular system: S1 & S2 heard, RRR. No JVD, murmurs, rubs, gallops or clicks. No pedal edema. Gastrointestinal system: Abdomen is nondistended, soft and nontender. No organomegaly or masses felt. Normal bowel sounds heard. Central nervous system: Alert and oriented. No focal neurological deficits. Extremities: Symmetric 5 x 5 power. Skin: No rashes, lesions or ulcers Psychiatry: Judgement and insight appear normal. Mood & affect appropriate.     Data Reviewed: I have personally reviewed following labs and imaging studies  CBC:  Recent Labs Lab 05/12/16 1404 05/13/16 0430 05/14/16 0408 05/15/16 0445 05/16/16 0435  WBC 18.0* 10.8* 9.6 9.5 11.4*  HGB 10.2* 9.9* 8.9* 9.9* 10.4*  HCT 33.0* 31.3* 28.2* 31.7* 33.5*  MCV 100.6* 99.1 98.9 100.0 99.1  PLT 253 218 230 285 017   Basic Metabolic Panel:  Recent Labs Lab 05/09/16 1430 05/11/16 0631 05/11/16 0817 05/11/16 1410   05/12/16 1404 05/13/16 0430 05/14/16 0408 05/15/16 0445 05/16/16 0435  NA 135  135 131* 133* 137  < > 139 138 140 138 140  K 3.9  3.9 6.4* 6.3* 4.0  < > 5.3* 4.4 4.4 3.8 3.9  CL 99*  99* 92* 92* 96*  < > 95* 96* 96* 93* 93*  CO2 _2 17* 27  < > _3 GLUCOSE 112*  111* 89 235* 100*  < > 162* 127* 108* 92 85  BUN 43*  42* 47* 50* 19  < > 49* 47* 70* 40* 70*  CREATININE 7.39*  7.56* 8.58* 9.09* 4.08*  < > 7.49* 6.74* 9.58* 6.62* 9.57*  CALCIUM 7.9*  7.9* 7.9* 10.5* 9.2  < > 11.0* 8.4* 8.6* 9.3 9.2  MG  --   --  2.0  --   --   --   --   --   --   --   PHOS 1.3* 7.0*  --  4.2  --   --  6.1* 7.3*  --   --   < > = values in this interval not displayed. GFR: Estimated Creatinine Clearance: 3.8 mL/min (by C-G formula based on SCr of 9.57 mg/dL (H)). Liver Function Tests:  Recent Labs Lab 05/11/16 0817 05/11/16 1410 05/12/16 0359 05/13/16 0430 05/14/16 0408  AST 711*  --  421*  --   --   ALT 629*  --  529*  --   --   ALKPHOS 76  --  92  --   --   BILITOT 0.6  --  1.1  --   --   PROT 5.4*  --  6.5  --   --   ALBUMIN 2.9* 3.6 3.4* 3.1* 3.0*   No results for input(s): LIPASE, AMYLASE in the last 168 hours. No results for input(s): AMMONIA in the last 168 hours. Coagulation Profile: No results for input(s): INR, PROTIME in the last 168 hours. Cardiac Enzymes:  Recent Labs Lab 05/09/16 1430 05/11/16 0817 05/11/16 1410 05/11/16 2010 05/12/16 0948  TROPONINI 0.05* 0.05* 1.27* 1.53* 0.66*   BNP (last 3 results) No results for input(s): PROBNP in the last 8760 hours. HbA1C: No results for input(s): HGBA1C in the last 72 hours. CBG:  Recent Labs Lab 05/15/16 1751 05/15/16 2034 05/16/16 0011 05/16/16 0424 05/16/16 0426  GLUCAP 102* 79 92 87 85   Lipid Profile: No results for input(s): CHOL, HDL, LDLCALC, TRIG, CHOLHDL, LDLDIRECT in the last 72 hours. Thyroid Function Tests: No results for input(s): TSH, T4TOTAL, FREET4, T3FREE, THYROIDAB in the  last 72 hours. Anemia Panel: No results for input(s): VITAMINB12, FOLATE, FERRITIN, TIBC, IRON, RETICCTPCT in the last 72 hours. Sepsis Labs: No results for input(s): PROCALCITON, LATICACIDVEN in the last 168 hours.  Recent Results (from the past 240 hour(s))  MRSA PCR Screening     Status: None   Collection Time: 05/09/16  2:38 AM  Result Value Ref Range Status   MRSA by PCR NEGATIVE NEGATIVE Final    Comment:        The GeneXpert MRSA Assay (FDA approved for NASAL specimens only), is one component of a comprehensive MRSA colonization surveillance program. It is not intended to diagnose MRSA infection nor to guide or monitor treatment for MRSA infections.          Radiology Studies: No results found.      Scheduled Meds: . amiodarone  200 mg Oral Daily  . aspirin EC  81 mg Oral Daily  . chlorhexidine gluconate (MEDLINE KIT)  15 mL Mouth Rinse BID  . chlorhexidine gluconate (MEDLINE KIT)  15 mL Mouth Rinse BID  . famotidine (PEPCID) IV  20 mg Intravenous Q24H  . heparin  5,000 Units Subcutaneous Q8H  . mouth rinse  15 mL Mouth Rinse QID  . mouth rinse  15 mL Mouth Rinse QID  . multivitamin  1 tablet Oral Daily  . NIFEdipine  30 mg Oral Daily   Continuous Infusions:   LOS: 7 days    Time spent: 25 minutes     Kathie Dike, MD [facility] If 7PM-7AM, please contact night-coverage [email or web address] Password [facility] 05/16/2016, 6:21 AM

## 2016-05-16 NOTE — Progress Notes (Signed)
Patient Name: Shawna [name] Date of Encounter: 05/16/2016  Primary Cardiologist: Kate Sable MD  Hospital Problem List     Principal Problem:   Hypertensive crisis Active Problems:   Coronary atherosclerosis of native coronary artery   ESRD on hemodialysis (HCC)   Anemia of chronic kidney failure   Chronic diastolic CHF (congestive heart failure) (HCC)   Chest pain   Acute respiratory failure ([place])   Hypertensive crisis without congestive heart failure   Cardiac arrest [facility])   Palliative care encounter   DNR (do not resuscitate) discussion   Goals of care, counseling/discussion     Subjective   Intubated, awake, responds to verbal commands.  Inpatient Medications    Scheduled Meds: . amiodarone  200 mg Oral Daily  . aspirin EC  81 mg Oral Daily  . chlorhexidine gluconate (MEDLINE KIT)  15 mL Mouth Rinse BID  . chlorhexidine gluconate (MEDLINE KIT)  15 mL Mouth Rinse BID  . famotidine (PEPCID) IV  20 mg Intravenous Q24H  . heparin  5,000 Units Subcutaneous Q8H  . mouth rinse  15 mL Mouth Rinse QID  . mouth rinse  15 mL Mouth Rinse QID  . multivitamin  1 tablet Oral Daily  . NIFEdipine  30 mg Oral Daily   Continuous Infusions:  PRN Meds: sodium chloride, sodium chloride, sodium chloride, sodium chloride, acetaminophen, acetaminophen, ALPRAZolam, fentaNYL (SUBLIMAZE) injection, fentaNYL (SUBLIMAZE) injection, fluticasone, ipratropium-albuterol, lidocaine (PF), lidocaine-prilocaine, midazolam, ondansetron (ZOFRAN) IV, pentafluoroprop-tetrafluoroeth   Vital Signs    Vitals:   05/16/16 0900 05/16/16 0915 05/16/16 0945 05/16/16 1000  BP: (!) 157/89 (!) 175/64 (!) 130/39 (!) 142/69  Pulse: 89 90 92 88  Resp: _0 Temp:      TempSrc:      SpO2: 100% 100% 100% 100%  Weight:      Height:        Intake/Output Summary (Last 24 hours) at 05/16/16 1101 Last data filed at 05/16/16 0300  Gross per 24 hour  Intake                0 ml  Output                 0 ml  Net                0 ml   Filed Weights   05/14/16 0815 05/15/16 0400 05/16/16 0400  Weight: 128 lb 12 oz (58.4 kg) 126 lb 1.7 oz (57.2 kg) 126 lb 5.2 oz (57.3 kg)    Physical Exam    GEN: Well nourished, well developed, in no acute distress. Intubated HEENT: Grossly normal.  Neck: Supple, no JVD, carotid bruits, or masses. Cardiac: RRR, distant, 1/6 systolic murmur,s!, rubs, or gallops. No clubbing, cyanosis, edema.  Radials/DP/PT 2+ and equal bilaterally.  Respiratory:  Intubated, coarse breath sounds with crackles in the bases. GI: Soft, nontender, nondistended, BS + x 4. MS: no deformity or atrophy. Right subclavian central line. Skin: warm and dry, no rash. Neuro:  Strength and sensation are intact. Psych: Unable to assess  Labs    CBC  Recent Labs  05/15/16 0445 05/16/16 0435  WBC 9.5 11.4*  HGB 9.9* 10.4*  HCT 31.7* 33.5*  MCV 100.0 99.1  PLT 285 357   Basic Metabolic Panel  Recent Labs  05/14/16 0408 05/15/16 0445 05/16/16 0435  NA 140 138 140  K 4.4 3.8 3.9  CL 96* 93* 93*  CO2 _1 GLUCOSE  108* 92 85  BUN 70* 40* 70*  CREATININE 9.58* 6.62* 9.57*  CALCIUM 8.6* 9.3 9.2  PHOS 7.3*  --   --    Liver Function Tests  Recent Labs  05/14/16 0408  ALBUMIN 3.0*    Telemetry     Normal sinus rhythm, PVCs, LVH- Personally Reviewed   Cardiac Studies  Echocardiogram 09/09/2015 Left ventricle: The cavity size was normal. Wall thickness was   increased in a pattern of mild LVH. Systolic function was normal.   The estimated ejection fraction was in the range of 60% to 65%.   Wall motion was normal; there were no regional wall motion   abnormalities. Features are consistent with a pseudonormal left   ventricular filling pattern, with concomitant abnormal relaxation   and increased filling pressure (grade 2 diastolic dysfunction). - Mitral valve: Mildly thickened leaflets . There was moderate   regurgitation. - Left atrium: The atrium was  mildly dilated. - Right atrium: Central venous pressure (est): 8 mm Hg. - Tricuspid valve: There was mild regurgitation. - Pulmonary arteries: PA peak pressure: 45 mm Hg (S). - Pericardium, extracardiac: There was no pericardial effusion.  Impressions:  - Mild LVH with LVEF 60-65%. Grade 2 diastolic dysfunction with   increased LV filling pressure. Mild left atrial enlargement.   Mildly thickened mitral leaflets with moderate mitral   regurgitation. Mild tricuspid regurgitation with PASP 45 mmHg.   Patient Profile    75-year-old female admitted with hypertensive urgency, pulmonary edema, currently intubated, with history of CAD prior stenting CABG, carotid stenosis, chronic diastolic heart failure, PSVT on amiodarone, who was admitted with chest pain.   Assessment & Plan    1. Hypertensive Emergency: Now off labetalol and nitroglycerin drips. Remains on nifedipine 30 mg via orogastric tube BP is controlled. Most recent echocardiogram reveals normal LV systolic function with grade 2 diastolic dysfunction and LVH. Blood pressure well-controlled currently..   2. Demand ischemia: in the setting of hypertension. Unlikely ACS. Troponin peak at 1.53 trending downward. No plans for invasive testing at this time.  4. ESRD: Dialysis per nephrology.  Signed, Kathryn Lawrence, NP  05/16/2016, 11:01 AM   Pt seen and examined  I agree with findings as noted by K Lawrence above  On exam, pt is intubated Lungs Coarse BS    Cardiac exam:  RRR  No signif murmurs Ext without edema. Impression BP is improved  Follow for now on current regimen No new recommendations.    Paula Ross   

## 2016-05-16 NOTE — Care Management Important Message (Signed)
Important Message  Patient Details  Name: DEJANAY WAMBOLDT MRN: [id number] Date of Birth: 1941/02/03   Medicare Important Message Given:  Yes    Sherald Barge, RN 05/16/2016, 10:58 AM

## 2016-05-16 NOTE — Progress Notes (Signed)
Subjective: Interval History: Patient remained intubated. She shakes her head to say no pain.  Objective: Vital signs in last 24 hours: Temp:  [96.8 F (36 C)-99.4 F (37.4 C)] 97.4 F (36.3 C) (11/06 0400) Pulse Rate:  [83-105] 87 (11/06 0500) Resp:  [13-21] 14 (11/06 0500) BP: (81-176)/(52-86) 118/66 (11/06 0500) SpO2:  [100 %] 100 % (11/06 0500) FiO2 (%):  [35 %] 35 % (11/06 0347) Weight:  [57.3 kg (126 lb 5.2 oz)] 57.3 kg (126 lb 5.2 oz) (11/06 0400) Weight change: -1.1 kg (-2 lb 6.8 oz)  Intake/Output from previous day: No intake/output data recorded. Intake/Output this shift: No intake/output data recorded.  Patient remains intubated. She is awake and alert and doesn't seem to be in any apparent distress. Chest: Inspiratory crackles Heart: RRR  Extremties: No edema  Lab Results:  Recent Labs  05/15/16 0445 05/16/16 0435  WBC 9.5 11.4*  HGB 9.9* 10.4*  HCT 31.7* 33.5*  PLT 285 304   BMET:   Recent Labs  05/15/16 0445 05/16/16 0435  NA 138 140  K 3.8 3.9  CL 93* 93*  CO2 28 26  GLUCOSE 92 85  BUN 40* 70*  CREATININE 6.62* 9.57*  CALCIUM 9.3 9.2   No results for input(s): PTH in the last 72 hours. Iron Studies: No results for input(s): IRON, TIBC, TRANSFERRIN, FERRITIN in the last 72 hours.  Studies/Results: No results found.  I have reviewed the patient's current medications.  Assessment/Plan: Problem #1 patient with episode of bradycardia/unresponsive/status post CODE BLUE and intubated.Presently her heart rate seems to be stable. At this moment she didn't have any other episode of bradycardia. Problem #2 hypertension: Her blood pressure is reasonably controlled. No episode of hypotension. Problem #3 end-stage renal disease: She is status post hemodialysis on Saturday. Presently her potassium is normal. Problem #5 anemia: Her hemoglobin has come up to her target goal. She is getting Epogen with dialysis. Problem #6 fluid management: As stated  above. Patient remained intubated. Presently she doesn't have any sign of fluid overload. Today she will be started to be weaned off from ventilator if possible today. Problem #7 metabolic bone disease: Her calcium is normal but her phosphorus is high. Not getting binder because of intubation. Plan: 1] patient doesn't require dialysis today 2] will check renal panel in the morning 3] will dialyze patient in the morning.   LOS: 7 days   Shawna [name] S 05/16/2016,7:12 AM

## 2016-05-17 ENCOUNTER — Inpatient Hospital Stay (HOSPITAL_COMMUNITY): Payer: Medicare HMO

## 2016-05-17 ENCOUNTER — Ambulatory Visit (INDEPENDENT_AMBULATORY_CARE_PROVIDER_SITE_OTHER): Payer: Medicare HMO | Admitting: Internal Medicine

## 2016-05-17 DIAGNOSIS — G9340 Encephalopathy, unspecified: Secondary | ICD-10-CM | POA: Diagnosis not present

## 2016-05-17 DIAGNOSIS — G729 Myopathy, unspecified: Secondary | ICD-10-CM | POA: Diagnosis not present

## 2016-05-17 LAB — BASIC METABOLIC PANEL
BUN: 97 mg/dL — AB (ref 6–20)
CHLORIDE: 96 mmol/L — AB (ref 101–111)
CO2: 23 mmol/L (ref 22–32)
CREATININE: 12.31 mg/dL — AB (ref 0.44–1.00)
Calcium: 9.4 mg/dL (ref 8.9–10.3)
GFR calc Af Amer: 3 mL/min — ABNORMAL LOW (ref >60)
GFR calc non Af Amer: 3 mL/min — ABNORMAL LOW (ref >60)
Glucose, Bld: 85 mg/dL (ref 65–99)
POTASSIUM: 4.4 mmol/L (ref 3.5–5.1)
Sodium: 142 mmol/L (ref 135–145)

## 2016-05-17 LAB — CBC
HEMATOCRIT: 33.4 % — AB (ref 36.0–46.0)
Hemoglobin: 10.5 g/dL — ABNORMAL LOW (ref 12.0–15.0)
MCH: 31.1 pg (ref 26.0–34.0)
MCHC: 31.4 g/dL (ref 30.0–36.0)
MCV: 98.8 fL (ref 78.0–100.0)
PLATELETS: 355 10*3/uL (ref 150–400)
RBC: 3.38 MIL/uL — AB (ref 3.87–5.11)
RDW: 17 % — AB (ref 11.5–15.5)
WBC: 11 10*3/uL — ABNORMAL HIGH (ref 4.0–10.5)

## 2016-05-17 LAB — LACTIC ACID, PLASMA: LACTIC ACID, VENOUS: 1.2 mmol/L (ref 0.5–1.9)

## 2016-05-17 LAB — GLUCOSE, CAPILLARY
GLUCOSE-CAPILLARY: 37 mg/dL — AB (ref 65–99)
GLUCOSE-CAPILLARY: 35 mg/dL — AB (ref 65–99)
GLUCOSE-CAPILLARY: 93 mg/dL (ref 65–99)
GLUCOSE-CAPILLARY: 95 mg/dL (ref 65–99)
Glucose-Capillary: 173 mg/dL — ABNORMAL HIGH (ref 65–99)
Glucose-Capillary: 61 mg/dL — ABNORMAL LOW (ref 65–99)
Glucose-Capillary: 73 mg/dL (ref 65–99)
Glucose-Capillary: 74 mg/dL (ref 65–99)
Glucose-Capillary: 80 mg/dL (ref 65–99)
Glucose-Capillary: 95 mg/dL (ref 65–99)

## 2016-05-17 IMAGING — CR DG HAND 2V*R*
2 series · 2 of 2 positions shown · non-contrast
Comparison: None.

CLINICAL DATA: Right hand swelling

EXAM:
RIGHT HAND - 2 VIEW

[pa]
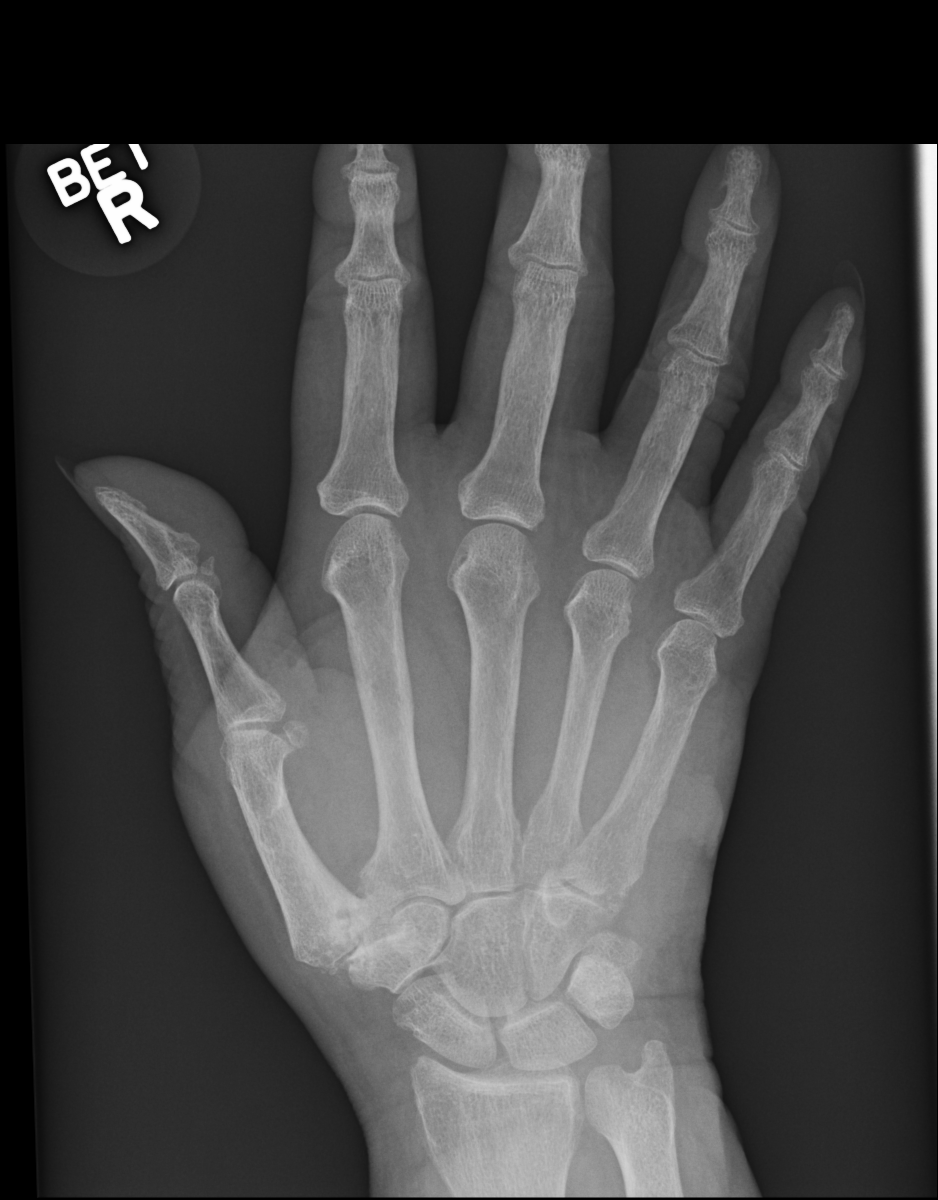

[lat]
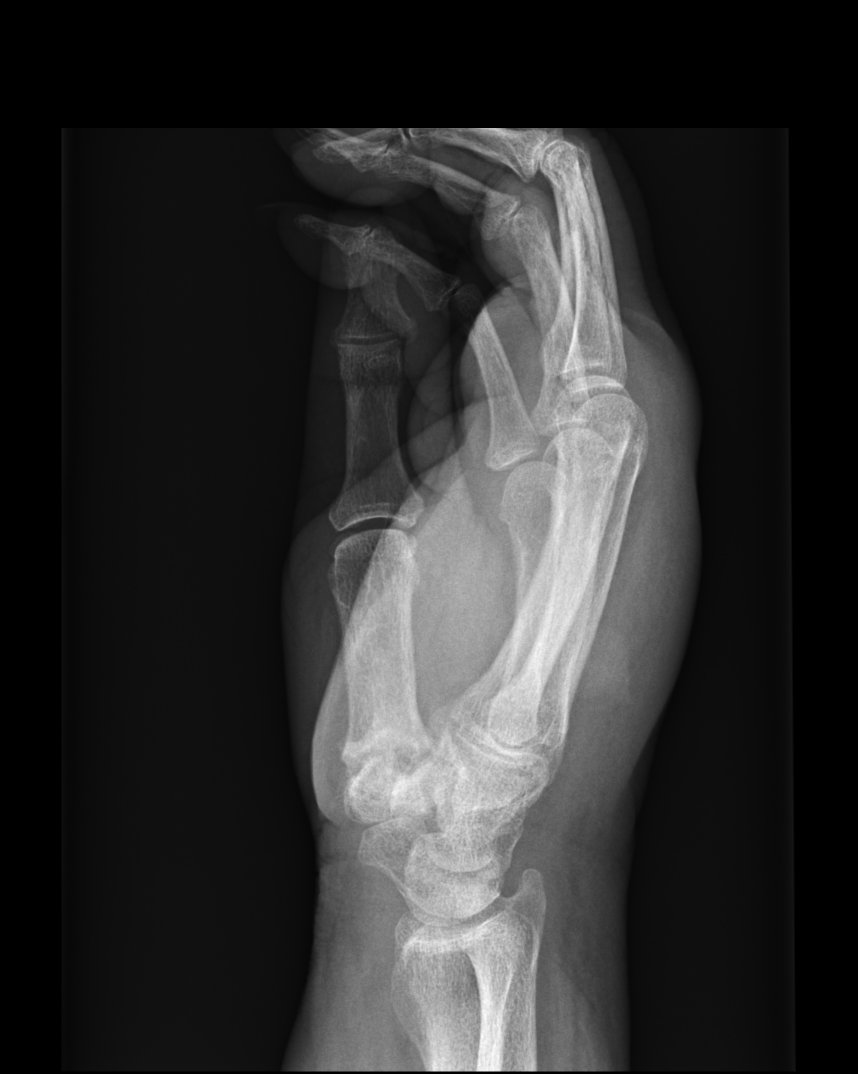

[2 of 2 positions shown; findings below may reference images not displayed]

FINDINGS: There is osteoarthritis at the base of the thumb metacarpal with
subchondral sclerosis and cystic change at the base of the first
proximal phalanx. Lesser degree of osteoarthritis involving the DIP
joints of the second through fifth digits more so involving the
index finger. Slight joint space narrowing of the third and fifth
MCP. No chondrocalcinosis. There is diffuse soft tissue swelling
about the hand more so dorsally. No frank bone destruction is seen.
Carpal bones are grossly intact.
IMPRESSION: Nonspecific soft tissue swelling of the visualized hand more so
dorsally. Osteoarthritis at the base of the thumb metacarpal, DIP
joints of the second through fifth digits and with slight joint
space narrowing of the third and fifth MCP.

## 2016-05-17 IMAGING — CR DG CHEST 1V PORT
1 series · 1 of 1 positions shown · non-contrast
Comparison: [DATE]

CLINICAL DATA: Hypoxia

EXAM:
PORTABLE CHEST 1 VIEW

[portable]
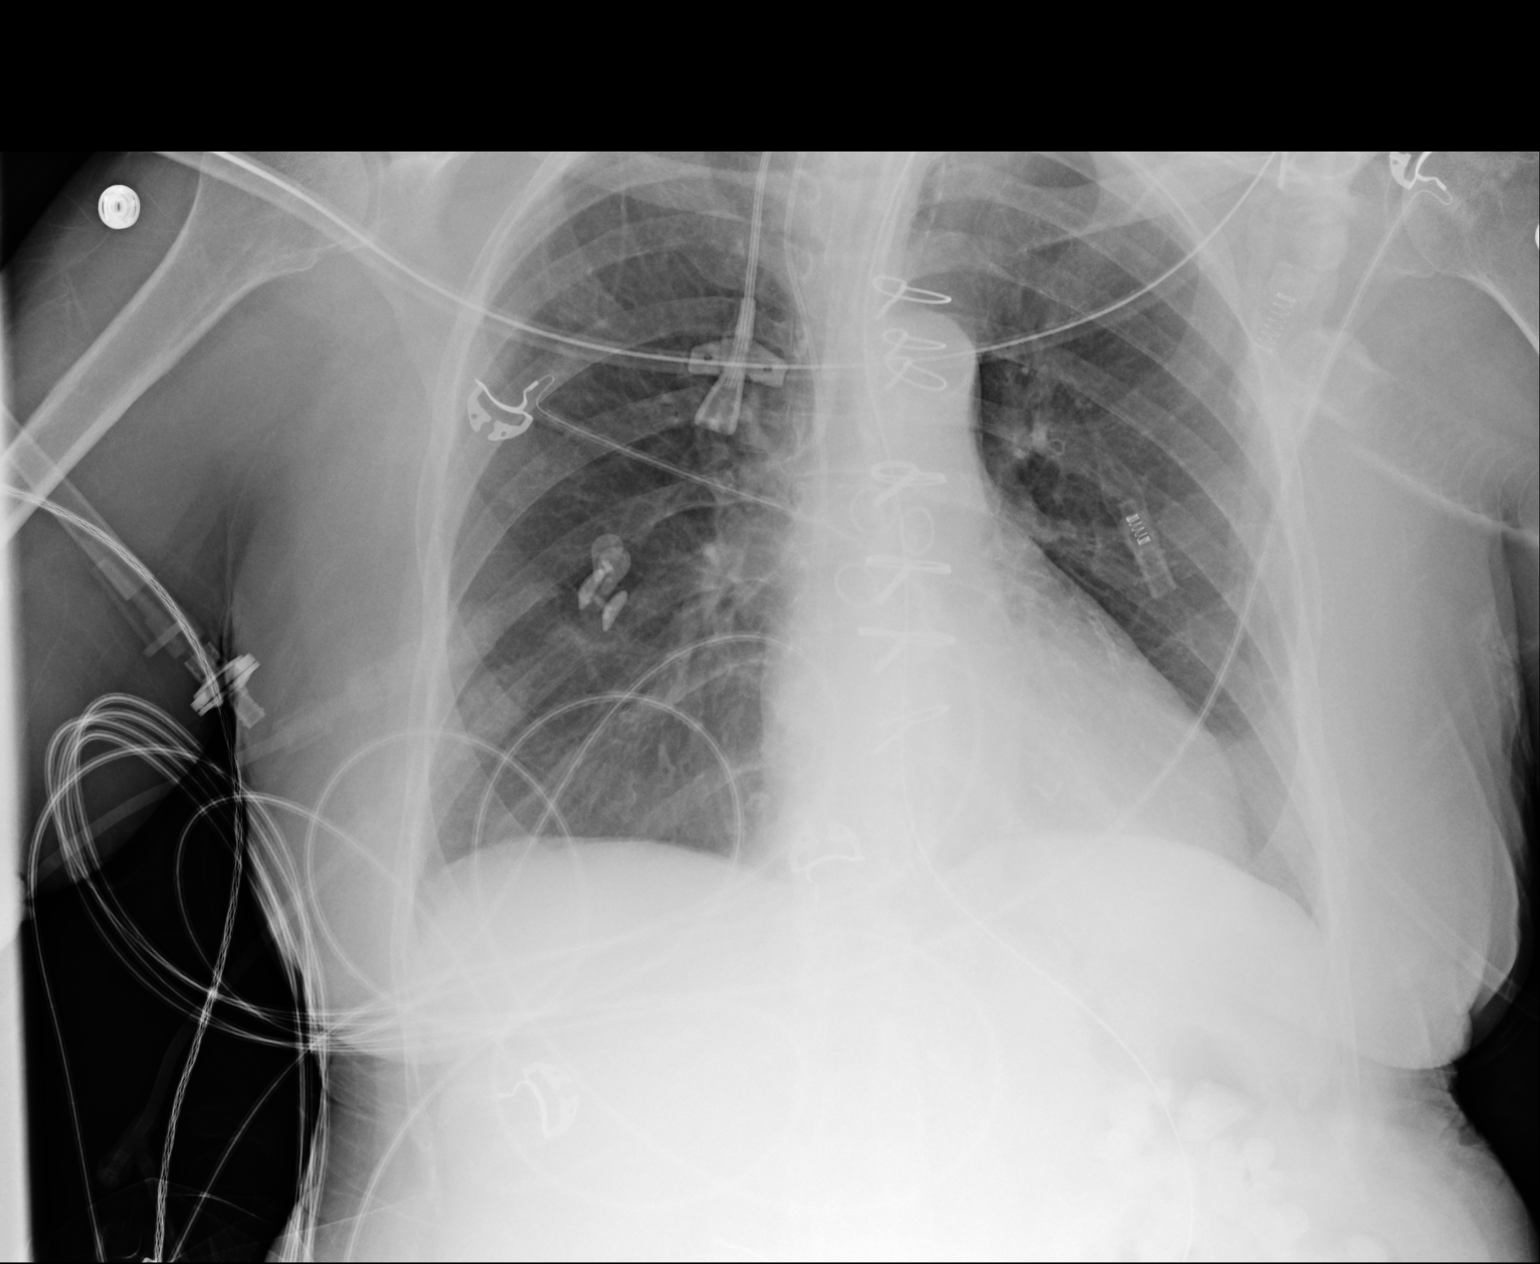

[1 of 1 positions shown; findings below may reference images not displayed]

FINDINGS: Endotracheal tube tip is 5.6 cm above the carina. Nasogastric tube
tip and side port are below the diaphragm. Central catheter tip is
in the superior vena cava. No pneumothorax. There is no edema or
consolidation. The heart size and pulmonary vascularity are normal.
No adenopathy. Patient is status post coronary artery bypass
grafting.
IMPRESSION: Tube and catheter positions as described without pneumothorax. No
edema or consolidation. Stable cardiac silhouette.

## 2016-05-17 MED ORDER — VANCOMYCIN HCL IN DEXTROSE 1-5 GM/200ML-% IV SOLN
1000.0000 mg | Freq: Once | INTRAVENOUS | Status: AC
  Administered 2016-05-18: 1000 mg via INTRAVENOUS
  Filled 2016-05-17: qty 200

## 2016-05-17 MED ORDER — DEXTROSE 50 % IV SOLN: 1.0000 | Freq: Once | INTRAVENOUS | Status: AC

## 2016-05-17 MED ORDER — PIPERACILLIN-TAZOBACTAM 3.375 G IVPB
3.3750 g | Freq: Two times a day (BID) | INTRAVENOUS | Status: DC
  Administered 2016-05-18 – 2016-05-25 (×15): 3.375 g via INTRAVENOUS
  Filled 2016-05-17 (×14): qty 50

## 2016-05-17 MED ORDER — VANCOMYCIN HCL 500 MG IV SOLR
500.0000 mg | INTRAVENOUS | Status: DC
  Administered 2016-05-19 – 2016-05-24 (×3): 500 mg via INTRAVENOUS
  Filled 2016-05-17 (×3): qty 500

## 2016-05-17 MED ORDER — DEXTROSE 50 % IV SOLN
INTRAVENOUS | Status: AC
  Administered 2016-05-17: 50 mL
  Filled 2016-05-17: qty 50

## 2016-05-17 MED ORDER — PIPERACILLIN-TAZOBACTAM 3.375 G IVPB
3.3750 g | Freq: Once | INTRAVENOUS | Status: AC
  Administered 2016-05-18: 3.375 g via INTRAVENOUS
  Filled 2016-05-17: qty 50

## 2016-05-17 MED ORDER — HEPARIN (PORCINE) IN NACL 100-0.45 UNIT/ML-% IJ SOLN
850.0000 [IU]/h | INTRAMUSCULAR | Status: DC
  Administered 2016-05-18: 950 [IU]/h via INTRAVENOUS
  Administered 2016-05-19: 800 [IU]/h via INTRAVENOUS
  Administered 2016-05-21: 900 [IU]/h via INTRAVENOUS
  Administered 2016-05-23 – 2016-05-24 (×2): 850 [IU]/h via INTRAVENOUS
  Filled 2016-05-17 (×6): qty 250

## 2016-05-17 NOTE — Care Management Note (Signed)
Case Management Note  Patient Details  Name: Shawna [name] MRN: [id number] Date of Birth: [birth year]  If discussed at Long Length of Stay Meetings, dates discussed:  05/17/2016    Sherald Barge, RN 05/17/2016, 3:15 PM

## 2016-05-17 NOTE — Progress Notes (Signed)
Pharmacy Antibiotic Note  Shawna [name] is a 75 y.o. female admitted on 05/08/2016 with cellulitis.  Pharmacy has been consulted for Vancomycin and Zosyn dosing.  Plan: Vancomycin 1gm IV x 1 ordered. ESRD. Zoysn 3.375gm IV x 1 ordered. MD aware of PCN allergy, proceed with therapy. HD completed today. Vancomycin 500mg  IV every HD. Zosyn 3.375gm IV every 12 hours.   Height: 5\' 1"  (154.9 cm) Weight: 118 lb 6.2 oz (53.7 kg) IBW/kg (Calculated) : 47.8  Temp (24hrs), Avg:97.2 F (36.2 C), Min:96.7 F (35.9 C), Max:97.6 F (36.4 C)   Recent Labs Lab 05/13/16 0430 05/14/16 0408 05/15/16 0445 05/16/16 0435 05/17/16 0437  WBC 10.8* 9.6 9.5 11.4* 11.0*  CREATININE 6.74* 9.58* 6.62* 9.57* 12.31*    Estimated Creatinine Clearance: 2.9 mL/min (by C-G formula based on SCr of 12.31 mg/dL (H)).    Allergies  Allergen Reactions  . Aspirin Other (See Comments)    Mess up her stomach; "makes my bowels have blood in them". Takes 81 mg EC Aspirin   . Contrast Media [Iodinated Diagnostic Agents] Itching  . Iron Itching and Other (See Comments)    "they gave me iron in dialysis; had to give me Benadryl cause I had to have the iron" (05/02/2012)  . Penicillins Other (See Comments)    "makes me real weak when I take it; like I'll pass out"Has patient had a PCN reaction causing immediate rash, facial/tongue/throat swelling, SOB or lightheadedness with hypotension: Nono Has patient had a PCN reaction causing severe rash involving mucus membranes or skin necrosis: Nono Has patient had a PCN reaction that required hospitalization Nono Has patient had a PCN reaction occurring within the last 10 years: Nono If all of the above  . Plavix [Clopidogrel Bisulfate] Rash  . Amlodipine Swelling    Leg swelling  . Bactrim [Sulfamethoxazole-Trimethoprim] Rash  . Dexilant [Dexlansoprazole] Other (See Comments)    Upset stomach  . Morphine And Related Itching    Itching in feet  . Nitrofurantoin Hives   . Prilosec [Omeprazole] Other (See Comments)    "back spasms"  . Venofer [Ferric Oxide] Itching    Patient reports using Benadryl prior to doses as [facility]  . Levaquin [Levofloxacin In D5w] Rash  . Protonix [Pantoprazole Sodium] Rash    Antimicrobials this admission: Zosyn 11/7 >>  Vanc 11/7 >>   Dose adjustments this admission: ESRD dosing  Microbiology results: 11/7 BCx: pending 10/30 MRSA PCR: (-)  Thank you for allowing pharmacy to be a part of this patient's care.  Pricilla Larsson 05/17/2016 11:01 PM

## 2016-05-17 NOTE — Progress Notes (Signed)
RN, Thayer Headings, paged NP because of RUE swelling, right hand redness and warmth. NP spoke with RN then called [facility] Myna Hidalgo on campus at [place]. Dr. Myna Hidalgo will go look at extremity and decide plan of care.  KJKG, NP [facility]

## 2016-05-17 NOTE — Progress Notes (Signed)
Called to the bedside to evaluate redness and swelling of the patient's right hand. There is two large bullae on the dorsal aspect on a background of marked edema and erythema. Pt appears to be in pain with slight contact on the dorsal aspect of the hand. On the palmar side, there is a some faint ecchymosis and a palpable cord just proximal to the wrist.   Concern is for cellulitis and possible DVT or superficial vein thrombosis. Plan for empiric abx and IV heparin. Will obtain x-ray of the hand now to evaluate for underlying gas given the severity, and venous US in the am to evaluate for clot.   Plan discussed with patient's husband at the bedside who is in agreement.

## 2016-05-17 NOTE — Progress Notes (Signed)
Pt received dialysis today. No fluid removal due to patient sbp remained under 115.  Patient will pass out if SBP goes below 115.

## 2016-05-17 NOTE — Progress Notes (Signed)
ANTICOAGULATION CONSULT NOTE - Initial Consult  Pharmacy Consult for Heparin Indication: R/O DVT  Patient Measurements: Height: 5' 1"  (154.9 cm) Weight: 118 lb 6.2 oz (53.7 kg) IBW/kg (Calculated) : 47.8  HEPARIN DW (KG): 53.7   Vital Signs: Temp: 97.3 F (36.3 C) (11/07 2000) Temp Source: Axillary (11/07 2000) BP: 190/77 (11/07 1845) Pulse Rate: 92 (11/07 1845)  Labs:  Recent Labs  05/15/16 0445 05/16/16 0435 05/17/16 0437  HGB 9.9* 10.4* 10.5*  HCT 31.7* 33.5* 33.4*  PLT 285 304 355  CREATININE 6.62* 9.57* 12.31*    Estimated Creatinine Clearance: 2.9 mL/min (by C-G formula based on SCr of 12.31 mg/dL (H)).   Medical History: Past Medical History:  Diagnosis Date  . Anxiety   . Arthritis   . AVM (arteriovenous malformation) of colon   . CAD (coronary artery disease)    a. 12/2011 NSTEMI/Cath/PCI LCX (2.25x14 Resolute DES) & D1 (2.25x22 Resolute DES);  b. 01/2012 Cath/PCI: LM 30, LAD 30p, 40-107m D1 stent ok, 99 in sm branch of diag, LCX patent stent, OM1 20, RCA 95 ost (4.0x12 Promus DES), EF 55%;  c. 04/2012 Lexi Cardiolite  EF 48%, small area of scar @ base/mid inflat wall with mild peri-infarct ischemia.; CABG 12/4  . Carotid artery disease ([place]    a. 664-15%LICA, 98/3094  . [place]itis [place]   . Chronic diastolic CHF (congestive heart failure) ([place]    a. 02/2012 Echo EF 60-65%, nl wall motion, Gr 1 DD, mod MR  . Colon cancer [place]  . Esophageal stricture   . [place] ([place]    ESRD due to HTN, [facility] 2011 [facility]  Access is LUA AVF as of Sept 2014.   .Marland KitchenGERD (gastroesophageal reflux disease)   . High cholesterol 12/2011  . History of blood transfusion 07/2011; 12/2011; 01/2012 X 2; 04/2012  . History of gout   . History of lower GI bleeding   . Hypertension   . Iron deficiency anemia   . Mitral regurgitation    a. Moderate by echo, 02/2012  . Myocardial infarction   .  [place]varian [place] ([place]  . Pneumonia ~ 2009  . PUD (peptic ulcer disease)   . TIA (transient ischemic attack)     Medications:  Scheduled:  . amiodarone  200 mg Oral Daily  . aspirin EC  81 mg Oral Daily  . chlorhexidine gluconate (MEDLINE KIT)  15 mL Mouth Rinse BID  . famotidine (PEPCID) IV  20 mg Intravenous Q24H  . mouth rinse  15 mL Mouth Rinse QID  . multivitamin  1 tablet Oral Daily  . NIFEdipine  30 mg Oral Daily  . piperacillin-tazobactam  3.375 g Intravenous Once  . vancomycin  1,000 mg Intravenous Once    Assessment: Okay for Protocol, r/o DVT.  Previous Heparin SQ. Goal of Therapy:  Heparin level 0.3-0.7 units/ml Monitor platelets by anticoagulation protocol: Yes   Plan:  Start heparin infusion at 950 units/hr Check anti-Xa level in 6-8 hours and daily while on heparin Continue to monitor H&H and platelets  HPricilla Larsson11/01/2016,10:47 PM

## 2016-05-17 NOTE — Progress Notes (Signed)
MD paged and made aware about the patient's 2 hypoglycemic events during the shift today. New orders given.

## 2016-05-17 NOTE — Progress Notes (Signed)
Patient Name: Shawna [name] Date of Encounter: 05/17/2016  Primary Cardiologist: Dr. Jaquita Folds Problem List     Principal Problem:   Hypertensive crisis Active Problems:   Coronary atherosclerosis of native coronary artery   ESRD on hemodialysis (HCC)   Anemia of chronic kidney failure   Chronic diastolic CHF (congestive heart failure) (HCC)   Chest pain   Acute respiratory failure ([place])   Hypertensive crisis without congestive heart failure   Cardiac arrest [facility])   Palliative care encounter   DNR (do not resuscitate) discussion   Goals of care, counseling/discussion     Subjective   On vent, no chest pain, responsive, having dialysis  Inpatient Medications    Scheduled Meds: . amiodarone  200 mg Oral Daily  . aspirin EC  81 mg Oral Daily  . chlorhexidine gluconate (MEDLINE KIT)  15 mL Mouth Rinse BID  . famotidine (PEPCID) IV  20 mg Intravenous Q24H  . heparin  5,000 Units Subcutaneous Q8H  . mouth rinse  15 mL Mouth Rinse QID  . multivitamin  1 tablet Oral Daily  . NIFEdipine  30 mg Oral Daily   Continuous Infusions:  PRN Meds: sodium chloride, sodium chloride, sodium chloride, sodium chloride, acetaminophen, acetaminophen, ALPRAZolam, fentaNYL (SUBLIMAZE) injection, fentaNYL (SUBLIMAZE) injection, fluticasone, ipratropium-albuterol, lidocaine (PF), lidocaine-prilocaine, midazolam, ondansetron (ZOFRAN) IV, pentafluoroprop-tetrafluoroeth   Vital Signs    Vitals:   05/17/16 0500 05/17/16 0530 05/17/16 0600 05/17/16 0727  BP: 137/61 (!) 148/50 (!) 112/43   Pulse: 81 84 78 79  Resp: 14 14 14 14   Temp:    (!) 96.7 F (35.9 C)  TempSrc:    Axillary  SpO2: 100% 100% 100% 98%  Weight: 118 lb 6.2 oz (53.7 kg)     Height:       No intake or output data in the 24 hours ending 05/17/16 0759 Filed Weights   05/15/16 0400 05/16/16 0400 05/17/16 0500  Weight: 126 lb 1.7 oz (57.2 kg) 126 lb 5.2 oz (57.3 kg) 118 lb 6.2 oz (53.7 kg)    Physical Exam      GEN: Well nourished,  in no acute distress. Intubated on Vent  HEENT: normocephalic, sclera clear, mucus membranes moist.  Neck: Supple Cardiac: RRR, no murmurs, rubs, or gallops. No clubbing, cyanosis, edema.   Respiratory:  Respirations regular and unlabored, clear to auscultation bilaterally, ant,  without rales, rhonchi or wheezes. GI: Abd -Soft, nontender, nondistended, BS + x 4. MS: no deformity or atrophy. Skin: warm and dry, brisk capillary refill, no obvious rash Neuro:  Alert and oriented X 3 MAE, follows commands Psych: answers questions appropriately by shaking her head yes or no,Normal and pleasant affect.   Labs    CBC  Recent Labs  05/16/16 0435 05/17/16 0437  WBC 11.4* 11.0*  HGB 10.4* 10.5*  HCT 33.5* 33.4*  MCV 99.1 98.8  PLT 304 540   Basic Metabolic Panel  Recent Labs  05/16/16 0435 05/17/16 0437  NA 140 142  K 3.9 4.4  CL 93* 96*  CO2 26 23  GLUCOSE 85 85  BUN 70* 97*  CREATININE 9.57* 12.31*  CALCIUM 9.2 9.4   Liver Function Tests No results for input(s): AST, ALT, ALKPHOS, BILITOT, PROT, ALBUMIN in the last 72 hours. No results for input(s): LIPASE, AMYLASE in the last 72 hours. Cardiac Enzymes No results for input(s): CKTOTAL, CKMB, CKMBINDEX, TROPONINI in the last 72 hours. BNP Invalid input(s): POCBNP D-Dimer No results for input(s): DDIMER in the  last 72 hours. Hemoglobin A1C No results for input(s): HGBA1C in the last 72 hours. Fasting Lipid Panel No results for input(s): CHOL, HDL, LDLCALC, TRIG, CHOLHDL, LDLDIRECT in the last 72 hours. Thyroid Function Tests No results for input(s): TSH, T4TOTAL, T3FREE, THYROIDAB in the last 72 hours.  Invalid input(s): FREET3  Telemetry    SR - Personally Reviewed  ECG    Last one 05/11/16 SR with LVH none since - Personally Reviewed  Radiology    No results found.  Cardiac Studies   Echocardiogram 09/09/2015 Left ventricle: The cavity size was normal. Wall thickness  was increased in a pattern of mild LVH. Systolic function was normal. The estimated ejection fraction was in the range of 60% to 65%. Wall motion was normal; there were no regional wall motion abnormalities. Features are consistent with a pseudonormal left ventricular filling pattern, with concomitant abnormal relaxation and increased filling pressure (grade 2 diastolic dysfunction). - Mitral valve: Mildly thickened leaflets . There was moderate regurgitation. - Left atrium: The atrium was mildly dilated. - Right atrium: Central venous pressure (est): 8 mm Hg. - Tricuspid valve: There was mild regurgitation. - Pulmonary arteries: PA peak pressure: 45 mm Hg (S). - Pericardium, extracardiac: There was no pericardial effusion.  Impressions:  - Mild LVH with LVEF 60-65%. Grade 2 diastolic dysfunction with increased LV filling pressure. Mild left atrial enlargement. Mildly thickened mitral leaflets with moderate mitral regurgitation. Mild tricuspid regurgitation with PASP 45 mmHg.  Patient Profile     75 year old female admitted with hypertensive urgency, pulmonary edema, currently intubated, with history of CAD prior stenting CABG, carotid stenosis, chronic diastolic heart failure, PSVT on amiodarone, who was admitted with chest pain.   Assessment & Plan    1. Hypertensive Emergency: Now off labetalol and nitroglycerin drips. Remains on nifedipine 30 mg via orogastric tube BP is elevated today. Most recent echocardiogram reveals normal LV systolic function with grade 2 diastolic dysfunction and LVH.   2. Demand ischemia: in the setting of hypertension. Unlikely ACS. Troponin peak at 1.53 trending downward. No plans for invasive testing at this time.  4. ESRD: Dialysis per nephrology.  5. Respiratory failure on Vent, could not wean yesterday  Signed, Cecilie Kicks, NP  05/17/2016, 7:59 AM  [place] Pager [phone]  After 5 or  weekends [phone]  Patient examined chart reviewed. Off iv drips continue to try and RX HTN with meds via tube Wean per pulmonary and primary team. No apparent chest pain continue dialysis no plans For invasive evaluation at this time  Jenkins Rouge

## 2016-05-17 NOTE — Progress Notes (Signed)
PROGRESS NOTE    Shawna [name]  WUJ:[id number] DOB: 11-Jan-1941 DOA: 05/08/2016 PCP: [name], MD    Brief Narrative:  42 yof with a history of CAD and ESRD, presented with acute onset of chest pain unrelieved by aspirin and nitroglycerin. In the emergency department BP 230/130 and she was noted to have new ST depression. She was started on nitroglycerin infusion and IV labetalol and admitted for hypertensive crisis with chest pain.Her chest pain had resolved but she remained hypertensive. On 11/1 patient suddenly became bradycardic and unresponsive. She underwent CPR, chest compressions, received epinephrine and had ROSC. On 11/2 prior to starting her dialysis she was noted to be hypertensive so she was given IV labetalol and developed bradycardia with subsequent PEA arrest. She underwent CPR again for <11mns and had ROSC. She currently remains on ventilator. Weaning has been difficult due to periods of apnea.   Assessment & Plan:   Principal Problem:   Hypertensive crisis Active Problems:   Coronary atherosclerosis of native coronary artery   ESRD on hemodialysis (HCC)   Anemia of chronic kidney failure   Chronic diastolic CHF (congestive heart failure) (HCC)   Chest pain   Ac[place] respiratory failure [place]   Hypertensive crisis without congestive heart failure   Cardiac arrest ([facility]   Palliative care encounter   DNR (do not resuscitate) discussion   Goals of care, counseling/discussion  1. Bradycardia with PEA cardiac arrest. On 11/1, patient's heart rate became bradycardic into PEA. She underwent CPR, received epinephrine, bicarbonate and calcium chloride. She had ROSC after approximately 25 mins of CPR. Lab work had indicated an elevated potassium of 6.4, which may have been cause. She was brieflystarted on a epinephrine infusion which was quicklyweaned off. CXR did not show any acute process. Cardiology following. On 11/2, prior to starting dialysis, patient was noted to be  hypertensive, despite being on nitroglycerin infusion. She received one dose of IV labetalol and subsequently developed bradycardia and went into PEA arrest. She received epinephrine, bicarbonate, and calcium chloride, as well as CPR for <517ms and had ROSC. Currently, heart rate has been stable. Cardiology following.  2. Acute respiratory failure. Ventilator dependent. Patient intubated while undergoing CPR. Pulmonology following. She is undergoing daily weaning trials. She remains hemodynamically stable. Weaning has been unsuccessful due to intermittent periods of apnea. Neurology consulted. 3. Hypertensive crisis with chest pain. Patient was admitted with a blood pressure of 230/130 and chest pain. She was started on nitroglycerin and IV labetalol which have since been weanedoff. Her chest pain had resolved. Blood pressures are currently stable. She becomes symptomatic from hypotension with sbp in the low 100s and passes out. 4. Elevated troponin. Patient has a history of CAD status post CABG and stenting. Patient had no evidence of ACS on admission. Since that time, her troponin has started to rise. Cardiology following and felt that this may be demand ischemia/related to CPR. Troponin peak was 1.5 and has since trended down since.  5. ESRD on HD MWF. Nephrology is following for dialysis needs.  6. Chronic diastolic congestive heart failure. Patient has an echocardiogram on 09/2015 with an EF of 6095-62%grade 2 diastolic dysfunction. Continue fluid management with dialysis. 7. PSVT, maintained on amiodarone. EKG remains in sinus rhythm.  8. Myoclonus. Suspect secondary to CKD/ESRD on HD. No history or objective findings to suggest acute CNS process. Reviewed meds, no likely offenders. Monitor for now 9. Hyperkalemia. Resolved.  10. Elevated TSH. Recheck as an outpatient. 11. Anemia of chronic kidney  disease. Hemoglobin has trended down to 8.9. There are no signs of active bleeding at this time.  Hemoglobin has improved to 10.4. Will continue to monitor.  DVT prophylaxis: Heparin injection  Code Status: FULL  Family Communication: husband at bedside Disposition Plan: Discharge home once improved.    Consultants:   Nephrology   Cardiology  Pulmonology  Nutrition   Procedures:   Intubation 11/1>>  Antimicrobials  None    Subjective: Intubated and sedated   Objective: Vitals:   05/17/16 0430 05/17/16 0500 05/17/16 0530 05/17/16 0600  BP: (!) 167/80 137/61 (!) 148/50 (!) 112/43  Pulse: 83 81 84 78  Resp: _0 Temp:      TempSrc:      SpO2: 100% 100% 100% 100%  Weight:  53.7 kg (118 lb 6.2 oz)    Height:       No intake or output data in the 24 hours ending 05/17/16 0645 Filed Weights   05/15/16 0400 05/16/16 0400 05/17/16 0500  Weight: 57.2 kg (126 lb 1.7 oz) 57.3 kg (126 lb 5.2 oz) 53.7 kg (118 lb 6.2 oz)    Examination:  General exam: Appears calm and comfortable  Respiratory system: Clear to auscultation. Respiratory effort normal. Cardiovascular system: S1 & S2 heard, RRR. No JVD, murmurs, rubs, gallops or clicks. No pedal edema. Gastrointestinal system: Abdomen is nondistended, soft and nontender. No organomegaly or masses felt. Normal bowel sounds heard. Central nervous system: No focal neurological deficits. Extremities: Symmetric 5 x 5 power. Skin: No rashes, lesions or ulcers Psychiatry: cannot assess     Data Reviewed: I have personally reviewed following labs and imaging studies  CBC:  Recent Labs Lab 05/13/16 0430 05/14/16 0408 05/15/16 0445 05/16/16 0435 05/17/16 0437  WBC 10.8* 9.6 9.5 11.4* 11.0*  HGB 9.9* 8.9* 9.9* 10.4* 10.5*  HCT 31.3* 28.2* 31.7* 33.5* 33.4*  MCV 99.1 98.9 100.0 99.1 98.8  PLT 218 230 285 304 326   Basic Metabolic Panel:  Recent Labs Lab 05/11/16 0631 05/11/16 0817 05/11/16 1410  05/13/16 0430 05/14/16 0408 05/15/16 0445 05/16/16 0435 05/17/16 0437  NA 131* 133* 137  < > 138 140  138 140 142  K 6.4* 6.3* 4.0  < > 4.4 4.4 3.8 3.9 4.4  CL 92* 92* 96*  < > 96* 96* 93* 93* 96*  CO2 26 17* 27  < > _1 GLUCOSE 89 235* 100*  < > 127* 108* 92 85 85  BUN 47* 50* 19  < > 47* 70* 40* 70* 97*  CREATININE 8.58* 9.09* 4.08*  < > 6.74* 9.58* 6.62* 9.57* 12.31*  CALCIUM 7.9* 10.5* 9.2  < > 8.4* 8.6* 9.3 9.2 9.4  MG  --  2.0  --   --   --   --   --   --   --   PHOS 7.0*  --  4.2  --  6.1* 7.3*  --   --   --   < > = values in this interval not displayed. GFR: Estimated Creatinine Clearance: 2.9 mL/min (by C-G formula based on SCr of 12.31 mg/dL (H)). Liver Function Tests:  Recent Labs Lab 05/11/16 0817 05/11/16 1410 05/12/16 0359 05/13/16 0430 05/14/16 0408  AST 711*  --  421*  --   --   ALT 629*  --  529*  --   --   ALKPHOS 76  --  92  --   --   BILITOT 0.6  --  1.1  --   --   PROT 5.4*  --  6.5  --   --   ALBUMIN 2.9* 3.6 3.4* 3.1* 3.0*   No results for input(s): LIPASE, AMYLASE in the last 168 hours. No results for input(s): AMMONIA in the last 168 hours. Coagulation Profile: No results for input(s): INR, PROTIME in the last 168 hours. Cardiac Enzymes:  Recent Labs Lab 05/11/16 0817 05/11/16 1410 05/11/16 2010 05/12/16 0948  TROPONINI 0.05* 1.27* 1.53* 0.66*   BNP (last 3 results) No results for input(s): PROBNP in the last 8760 hours. HbA1C: No results for input(s): HGBA1C in the last 72 hours. CBG:  Recent Labs Lab 05/16/16 1118 05/16/16 1612 05/16/16 1940 05/17/16 0000 05/17/16 0423  GLUCAP 98 88 90 74 80   Lipid Profile: No results for input(s): CHOL, HDL, LDLCALC, TRIG, CHOLHDL, LDLDIRECT in the last 72 hours. Thyroid Function Tests: No results for input(s): TSH, T4TOTAL, FREET4, T3FREE, THYROIDAB in the last 72 hours. Anemia Panel: No results for input(s): VITAMINB12, FOLATE, FERRITIN, TIBC, IRON, RETICCTPCT in the last 72 hours. Sepsis Labs: No results for input(s): PROCALCITON, LATICACIDVEN in the last 168 hours.  Recent  Results (from the past 240 hour(s))  MRSA PCR Screening     Status: None   Collection Time: 05/09/16  2:38 AM  Result Value Ref Range Status   MRSA by PCR NEGATIVE NEGATIVE Final    Comment:        The GeneXpert MRSA Assay (FDA approved for NASAL specimens only), is one component of a comprehensive MRSA colonization surveillance program. It is not intended to diagnose MRSA infection nor to guide or monitor treatment for MRSA infections.          Radiology Studies: No results found.      Scheduled Meds: . amiodarone  200 mg Oral Daily  . aspirin EC  81 mg Oral Daily  . chlorhexidine gluconate (MEDLINE KIT)  15 mL Mouth Rinse BID  . famotidine (PEPCID) IV  20 mg Intravenous Q24H  . heparin  5,000 Units Subcutaneous Q8H  . mouth rinse  15 mL Mouth Rinse QID  . multivitamin  1 tablet Oral Daily  . NIFEdipine  30 mg Oral Daily   Continuous Infusions:   LOS: 8 days    Time spent: 25 minutes     Kathie Dike, MD [facility] If 7PM-7AM, please contact night-coverage [email or web address] Password [email or web address] 05/17/2016, 6:45 AM

## 2016-05-17 NOTE — Progress Notes (Addendum)
Dr. Merlene Laughter paged for new consult request. Awaiting to hear back from physician.

## 2016-05-17 NOTE — Progress Notes (Addendum)
Subjective: She remains intubated and on mechanical ventilation. She failed weaning yesterday because of apnea. She is awake and alert. No new issues noted overnight. Specifically no nausea or vomiting. She's a little bit agitated but able to answer questions with gestures and head movements. Denies pain.  Objective: Vital signs in last 24 hours: Temp:  [96.7 F (35.9 C)-98.1 F (36.7 C)] 97.6 F (36.4 C) (11/07 0400) Pulse Rate:  [75-93] 78 (11/07 0600) Resp:  [8-32] 14 (11/07 0600) BP: (89-188)/(26-142) 112/43 (11/07 0600) SpO2:  [97 %-100 %] 100 % (11/07 0600) FiO2 (%):  [35 %] 35 % (11/07 0329) Weight:  [53.7 kg (118 lb 6.2 oz)] 53.7 kg (118 lb 6.2 oz) (11/07 0500) Weight change: -3.6 kg (-7 lb 15 oz) Last BM Date: 05/08/16  Intake/Output from previous day: No intake/output data recorded.  PHYSICAL EXAM General appearance: Alert, agitated intubated on mechanical ventilation Resp: rhonchi bilaterally Cardio: regular rate and rhythm, S1, S2 normal, no murmur, click, rub or gallop GI: soft, non-tender; bowel sounds normal; no masses,  no organomegaly Extremities: extremities normal, atraumatic, no cyanosis or edema Skin warm and dry. Mucous membranes are moist  Lab Results:  Results for orders placed or performed during the hospital encounter of 05/08/16 (from the past 48 hour(s))  Glucose, capillary     Status: Abnormal   Collection Time: 05/15/16  8:14 AM  Result Value Ref Range   Glucose-Capillary 102 (H) 65 - 99 mg/dL  Glucose, capillary     Status: Abnormal   Collection Time: 05/15/16 12:04 PM  Result Value Ref Range   Glucose-Capillary 108 (H) 65 - 99 mg/dL  Glucose, capillary     Status: Abnormal   Collection Time: 05/15/16  5:51 PM  Result Value Ref Range   Glucose-Capillary 102 (H) 65 - 99 mg/dL  Glucose, capillary     Status: None   Collection Time: 05/15/16  8:34 PM  Result Value Ref Range   Glucose-Capillary 79 65 - 99 mg/dL   Comment 1 Notify RN     Comment 2 Document in Chart   Glucose, capillary     Status: None   Collection Time: 05/16/16 12:11 AM  Result Value Ref Range   Glucose-Capillary 92 65 - 99 mg/dL   Comment 1 Notify RN    Comment 2 Document in Chart   Glucose, capillary     Status: None   Collection Time: 05/16/16  4:24 AM  Result Value Ref Range   Glucose-Capillary 87 65 - 99 mg/dL  Glucose, capillary     Status: None   Collection Time: 05/16/16  4:26 AM  Result Value Ref Range   Glucose-Capillary 85 65 - 99 mg/dL   Comment 1 Notify RN    Comment 2 Document in Chart   Basic metabolic panel     Status: Abnormal   Collection Time: 05/16/16  4:35 AM  Result Value Ref Range   Sodium 140 135 - 145 mmol/L   Potassium 3.9 3.5 - 5.1 mmol/L   Chloride 93 (L) 101 - 111 mmol/L   CO2 26 22 - 32 mmol/L   Glucose, Bld 85 65 - 99 mg/dL   BUN 70 (H) 6 - 20 mg/dL   Creatinine, Ser 9.57 (H) 0.44 - 1.00 mg/dL   Calcium 9.2 8.9 - 10.3 mg/dL   GFR calc non Af Amer 3 (L) >60 mL/min   GFR calc Af Amer 4 (L) >60 mL/min    Comment: (NOTE) The eGFR has been calculated using  the CKD EPI equation. This calculation has not been validated in all clinical situations. eGFR's persistently <60 mL/min signify possible Chronic Kidney Disease.   CBC     Status: Abnormal   Collection Time: 05/16/16  4:35 AM  Result Value Ref Range   WBC 11.4 (H) 4.0 - 10.5 K/uL   RBC 3.38 (L) 3.87 - 5.11 MIL/uL   Hemoglobin 10.4 (L) 12.0 - 15.0 g/dL   HCT 33.5 (L) 36.0 - 46.0 %   MCV 99.1 78.0 - 100.0 fL   MCH 30.8 26.0 - 34.0 pg   MCHC 31.0 30.0 - 36.0 g/dL   RDW 16.9 (H) 11.5 - 15.5 %   Platelets 304 150 - 400 K/uL  Glucose, capillary     Status: None   Collection Time: 05/16/16  7:26 AM  Result Value Ref Range   Glucose-Capillary 86 65 - 99 mg/dL  Glucose, capillary     Status: None   Collection Time: 05/16/16 11:18 AM  Result Value Ref Range   Glucose-Capillary 98 65 - 99 mg/dL  Glucose, capillary     Status: None   Collection Time: 05/16/16   4:12 PM  Result Value Ref Range   Glucose-Capillary 88 65 - 99 mg/dL  Glucose, capillary     Status: None   Collection Time: 05/16/16  7:40 PM  Result Value Ref Range   Glucose-Capillary 90 65 - 99 mg/dL   Comment 1 Notify RN   Glucose, capillary     Status: None   Collection Time: 05/17/16 12:00 AM  Result Value Ref Range   Glucose-Capillary 74 65 - 99 mg/dL   Comment 1 Notify RN   Glucose, capillary     Status: None   Collection Time: 05/17/16  4:23 AM  Result Value Ref Range   Glucose-Capillary 80 65 - 99 mg/dL   Comment 1 Notify RN   Basic metabolic panel     Status: Abnormal   Collection Time: 05/17/16  4:37 AM  Result Value Ref Range   Sodium 142 135 - 145 mmol/L   Potassium 4.4 3.5 - 5.1 mmol/L   Chloride 96 (L) 101 - 111 mmol/L   CO2 23 22 - 32 mmol/L   Glucose, Bld 85 65 - 99 mg/dL   BUN 97 (H) 6 - 20 mg/dL   Creatinine, Ser 12.31 (H) 0.44 - 1.00 mg/dL   Calcium 9.4 8.9 - 10.3 mg/dL   GFR calc non Af Amer 3 (L) >60 mL/min   GFR calc Af Amer 3 (L) >60 mL/min    Comment: (NOTE) The eGFR has been calculated using the CKD EPI equation. This calculation has not been validated in all clinical situations. eGFR's persistently <60 mL/min signify possible Chronic Kidney Disease.   CBC     Status: Abnormal   Collection Time: 05/17/16  4:37 AM  Result Value Ref Range   WBC 11.0 (H) 4.0 - 10.5 K/uL   RBC 3.38 (L) 3.87 - 5.11 MIL/uL   Hemoglobin 10.5 (L) 12.0 - 15.0 g/dL   HCT 33.4 (L) 36.0 - 46.0 %   MCV 98.8 78.0 - 100.0 fL   MCH 31.1 26.0 - 34.0 pg   MCHC 31.4 30.0 - 36.0 g/dL   RDW 17.0 (H) 11.5 - 15.5 %   Platelets 355 150 - 400 K/uL    ABGS  Recent Labs  05/15/16 0415  PHART 7.443  PO2ART 143.00*  HCO3 28.0   CULTURES Recent Results (from the past 240 hour(s))  MRSA PCR  Screening     Status: None   Collection Time: 05/09/16  2:38 AM  Result Value Ref Range Status   MRSA by PCR NEGATIVE NEGATIVE Final    Comment:        The GeneXpert MRSA Assay  (FDA approved for NASAL specimens only), is one component of a comprehensive MRSA colonization surveillance program. It is not intended to diagnose MRSA infection nor to guide or monitor treatment for MRSA infections.    Studies/Results: No results found.  Medications:  Prior to Admission:  Prescriptions Prior to Admission  Medication Sig Dispense Refill Last Dose  . ALPRAZolam (XANAX) 0.25 MG tablet Take 1-2 tablets by mouth at bedtime as needed for sleep.    05/08/2016 at Unknown time  . amiodarone (PACERONE) 200 MG tablet Take 1 tablet (200 mg total) by mouth daily. 30 tablet 3 05/08/2016 at Unknown time  . aspirin EC 81 MG tablet Take 81 mg by mouth daily.    05/08/2016 at Unknown time  . cloNIDine (CATAPRES) 0.1 MG tablet Take 1 tablet (0.1 mg total) by mouth 2 (two) times daily. 60 tablet 11 05/08/2016 at Unknown time  . epoetin alfa (EPOGEN,PROCRIT) 09628 UNIT/ML injection Inject 1 mL (10,000 Units total) into the vein every Monday, Wednesday, and Friday with hemodialysis. 1 mL 10 05/06/2016 at Unknown time  . fluticasone (FLONASE) 50 MCG/ACT nasal spray Place 2 sprays into the nose daily as needed for allergies.    unknown  . labetalol (NORMODYNE) 200 MG tablet Take 1 tablet (200 mg total) by mouth 2 (two) times daily. 90 tablet 1 05/08/2016 at Unknown time  . lanthanum (FOSRENOL) 1000 MG chewable tablet Chew 1 tablet (1,000 mg total) by mouth 3 (three) times daily with meals. 90 tablet 1 05/08/2016 at Unknown time  . multivitamin (RENA-VIT) TABS tablet Take 1 tablet by mouth daily.   05/08/2016 at Unknown time  . nitroGLYCERIN (NITROSTAT) 0.4 MG SL tablet Place 1 tablet (0.4 mg total) under the tongue every 5 (five) minutes as needed for chest pain. 25 tablet 3 05/08/2016 at Unknown time  . omeprazole (PRILOSEC) 20 MG capsule TAKE ONE CAPSULE BY MOUTH ONCE DAILY 30 capsule 5 05/08/2016 at Unknown time  . PROAIR HFA 108 (90 BASE) MCG/ACT inhaler Inhale 1 puff into the lungs every  6 (six) hours as needed for wheezing or shortness of breath.    unknown  . SENSIPAR 30 MG tablet Take 30 mg by mouth daily with supper.    05/08/2016 at Unknown time  . simvastatin (ZOCOR) 20 MG tablet Take 1 tablet (20 mg total) by mouth at bedtime. 90 tablet 3 05/07/2016 at Unknown time   Scheduled: . amiodarone  200 mg Oral Daily  . aspirin EC  81 mg Oral Daily  . chlorhexidine gluconate (MEDLINE KIT)  15 mL Mouth Rinse BID  . famotidine (PEPCID) IV  20 mg Intravenous Q24H  . heparin  5,000 Units Subcutaneous Q8H  . mouth rinse  15 mL Mouth Rinse QID  . multivitamin  1 tablet Oral Daily  . NIFEdipine  30 mg Oral Daily   Continuous:  ZMO:QHUTML chloride, sodium chloride, sodium chloride, sodium chloride, acetaminophen, acetaminophen, ALPRAZolam, fentaNYL (SUBLIMAZE) injection, fentaNYL (SUBLIMAZE) injection, fluticasone, ipratropium-albuterol, lidocaine (PF), lidocaine-prilocaine, midazolam, ondansetron (ZOFRAN) IV, pentafluoroprop-tetrafluoroeth  Assesment: She was admitted with hypertensive crisis. She is known to have coronary disease and heart failure. She had CODE BLUE 2. She has problems with cerebral perfusion when her blood pressure gets around 465-035 systolic. She was  intubated because of the first CODE BLUE and has been on the ventilator since. We have not been able to get her off because of episodes of being poorly responsive and then yesterday because she had apneic episodes when she was weaned. She has end-stage renal disease on dialysis. Dialysis has been difficult because of problems with her blood pressure and now problems with bradycardia. Principal Problem:   Hypertensive crisis Active Problems:   Coronary atherosclerosis of native coronary artery   ESRD on hemodialysis (HCC)   Anemia of chronic kidney failure   Chronic diastolic CHF (congestive heart failure) (HCC)   Chest pain   Acute respiratory failure ([place])   Hypertensive crisis without congestive heart  failure   Cardiac arrest [facility])   Palliative care encounter   DNR (do not resuscitate) discussion   Goals of care, counseling/discussion    Plan: Continue treatments. Work on weaning again. Addendum: She is having apnea again. It's not quite clear why this is occurring. I'm going to get a blood gas to make sure about PCO2 etc. It may be worth having neurology see her. She is however awake able to move she just doesn't have any respiratory effort.   LOS: 8 days   Arvella Massingale L 05/17/2016, 7:13 AM

## 2016-05-17 NOTE — Progress Notes (Signed)
Subjective: Interval History: Patient remained intubated. She remains alert. Presently she doesn't seem to be in any apparent distress.  Objective: Vital signs in last 24 hours: Temp:  [96.7 F (35.9 C)-98.1 F (36.7 C)] 97.6 F (36.4 C) (11/07 0400) Pulse Rate:  [75-93] 78 (11/07 0600) Resp:  [8-32] 14 (11/07 0600) BP: (89-188)/(26-142) 112/43 (11/07 0600) SpO2:  [97 %-100 %] 100 % (11/07 0600) FiO2 (%):  [35 %] 35 % (11/07 0329) Weight:  [53.7 kg (118 lb 6.2 oz)] 53.7 kg (118 lb 6.2 oz) (11/07 0500) Weight change: -3.6 kg (-7 lb 15 oz)  Intake/Output from previous day: No intake/output data recorded. Intake/Output this shift: No intake/output data recorded.  Patient remains intubated.  Chest: Inspiratory crackles Heart: RRR  Extremties: No edema  Lab Results:  Recent Labs  05/16/16 0435 05/17/16 0437  WBC 11.4* 11.0*  HGB 10.4* 10.5*  HCT 33.5* 33.4*  PLT 304 355   BMET:   Recent Labs  05/16/16 0435 05/17/16 0437  NA 140 142  K 3.9 4.4  CL 93* 96*  CO2 26 23  GLUCOSE 85 85  BUN 70* 97*  CREATININE 9.57* 12.31*  CALCIUM 9.2 9.4   No results for input(s): PTH in the last 72 hours. Iron Studies: No results for input(s): IRON, TIBC, TRANSFERRIN, FERRITIN in the last 72 hours.  Studies/Results: No results found.  I have reviewed the patient's current medications.  Assessment/Plan: Problem #1 patient with episode of bradycardia/unresponsive/status post CODE BLUE and intubated.Presently her heart rate seems to be stable. Patient also with history of passing out with mild low blood pressure. Problem #2 hypertension: Her blood pressure is reasonably controlled. No episode of hypotension. Problem #3 end-stage renal disease: She is status post hemodialysis on Saturday. Presently her potassium is normal. Problem #5 anemia: Her hemoglobin has come up to our  target goal. She is getting Epogen with dialysis. Problem #6 fluid management: As stated above. Patient  remained intubated. Presently she doesn't have any sign of fluid overload. An attempt to extubate her was unsuccessful yesterday because of apneic episode  Problem #7 metabolic bone disease: Her calcium is normal but her phosphorus is high. Not getting binder because of intubation. Plan: 1] we'll make arrangements for patient to get dialysis today for 4 hours. 2] will try to remove about 7-6/8 L if systolic blood pressure remains above 115.  2] will check renal panel in the morning   LOS: 8 days   Rosmarie Esquibel S 05/17/2016,7:24 AM

## 2016-05-18 ENCOUNTER — Encounter (HOSPITAL_COMMUNITY): Payer: Self-pay | Admitting: Radiology

## 2016-05-18 ENCOUNTER — Inpatient Hospital Stay (HOSPITAL_COMMUNITY): Payer: Medicare HMO

## 2016-05-18 LAB — RENAL FUNCTION PANEL
Albumin: 3 g/dL — ABNORMAL LOW (ref 3.5–5.0)
Anion gap: 16 — ABNORMAL HIGH (ref 5–15)
BUN: 60 mg/dL — AB (ref 6–20)
CALCIUM: 9.2 mg/dL (ref 8.9–10.3)
CO2: 26 mmol/L (ref 22–32)
CREATININE: 9.13 mg/dL — AB (ref 0.44–1.00)
Chloride: 96 mmol/L — ABNORMAL LOW (ref 101–111)
GFR calc non Af Amer: 4 mL/min — ABNORMAL LOW (ref >60)
GFR, EST AFRICAN AMERICAN: 4 mL/min — AB (ref >60)
GLUCOSE: 146 mg/dL — AB (ref 65–99)
Phosphorus: 8.2 mg/dL — ABNORMAL HIGH (ref 2.5–4.6)
Potassium: 3.9 mmol/L (ref 3.5–5.1)
SODIUM: 138 mmol/L (ref 135–145)

## 2016-05-18 LAB — BLOOD GAS, ARTERIAL
ALLENS TEST (PASS/FAIL): POSITIVE — AB
Acid-Base Excess: 3.2 mmol/L — ABNORMAL HIGH (ref 0.0–2.0)
Bicarbonate: 27.1 mmol/L (ref 20.0–28.0)
Drawn by: 382351
FIO2: 35
O2 Saturation: 97.7 %
PEEP: 5 cmH2O
Patient temperature: 37
RATE: 14 resp/min
VT: 380 mL
pCO2 arterial: 41.7 mmHg (ref 32.0–48.0)
pH, Arterial: 7.43 (ref 7.350–7.450)
pO2, Arterial: 133 mmHg — ABNORMAL HIGH (ref 83.0–108.0)

## 2016-05-18 LAB — CBC
HCT: 30.2 % — ABNORMAL LOW (ref 36.0–46.0)
HCT: 30.8 % — ABNORMAL LOW (ref 36.0–46.0)
HEMOGLOBIN: 9.4 g/dL — AB (ref 12.0–15.0)
Hemoglobin: 9.7 g/dL — ABNORMAL LOW (ref 12.0–15.0)
MCH: 30.9 pg (ref 26.0–34.0)
MCH: 31 pg (ref 26.0–34.0)
MCHC: 31.1 g/dL (ref 30.0–36.0)
MCHC: 31.5 g/dL (ref 30.0–36.0)
MCV: 99.3 fL (ref 78.0–100.0)
MCV: 98.4 fL (ref 78.0–100.0)
PLATELETS: 392 10*3/uL (ref 150–400)
Platelets: 338 10*3/uL (ref 150–400)
RBC: 3.04 MIL/uL — AB (ref 3.87–5.11)
RBC: 3.13 MIL/uL — ABNORMAL LOW (ref 3.87–5.11)
RDW: 16.7 % — ABNORMAL HIGH (ref 11.5–15.5)
RDW: 16.8 % — AB (ref 11.5–15.5)
WBC: 10.4 10*3/uL (ref 4.0–10.5)
WBC: 15.8 10*3/uL — ABNORMAL HIGH (ref 4.0–10.5)

## 2016-05-18 LAB — BASIC METABOLIC PANEL
ANION GAP: 14 (ref 5–15)
BUN: 43 mg/dL — ABNORMAL HIGH (ref 6–20)
CALCIUM: 9 mg/dL (ref 8.9–10.3)
CHLORIDE: 97 mmol/L — AB (ref 101–111)
CO2: 26 mmol/L (ref 22–32)
Creatinine, Ser: 7.28 mg/dL — ABNORMAL HIGH (ref 0.44–1.00)
GFR calc non Af Amer: 5 mL/min — ABNORMAL LOW (ref >60)
GFR, EST AFRICAN AMERICAN: 6 mL/min — AB (ref >60)
Glucose, Bld: 170 mg/dL — ABNORMAL HIGH (ref 65–99)
Potassium: 3.6 mmol/L (ref 3.5–5.1)
Sodium: 137 mmol/L (ref 135–145)

## 2016-05-18 LAB — GLUCOSE, CAPILLARY
GLUCOSE-CAPILLARY: 114 mg/dL — AB (ref 65–99)
GLUCOSE-CAPILLARY: 163 mg/dL — AB (ref 65–99)
Glucose-Capillary: 101 mg/dL — ABNORMAL HIGH (ref 65–99)
Glucose-Capillary: 110 mg/dL — ABNORMAL HIGH (ref 65–99)
Glucose-Capillary: 107 mg/dL — ABNORMAL HIGH (ref 65–99)

## 2016-05-18 LAB — LACTIC ACID, PLASMA: Lactic Acid, Venous: 1.1 mmol/L (ref 0.5–1.9)

## 2016-05-18 LAB — HEPARIN LEVEL (UNFRACTIONATED)
HEPARIN UNFRACTIONATED: 0.66 [IU]/mL (ref 0.30–0.70)
Heparin Unfractionated: 0.94 IU/mL — ABNORMAL HIGH (ref 0.30–0.70)

## 2016-05-18 IMAGING — CR DG CHEST 1V PORT
1 series · 1 of 1 positions shown · non-contrast
Comparison: Prior chest x-ray [DATE]

CLINICAL DATA: 75-year-old female currently intubated with
respiratory failure

EXAM:
PORTABLE CHEST 1 VIEW

[portable]
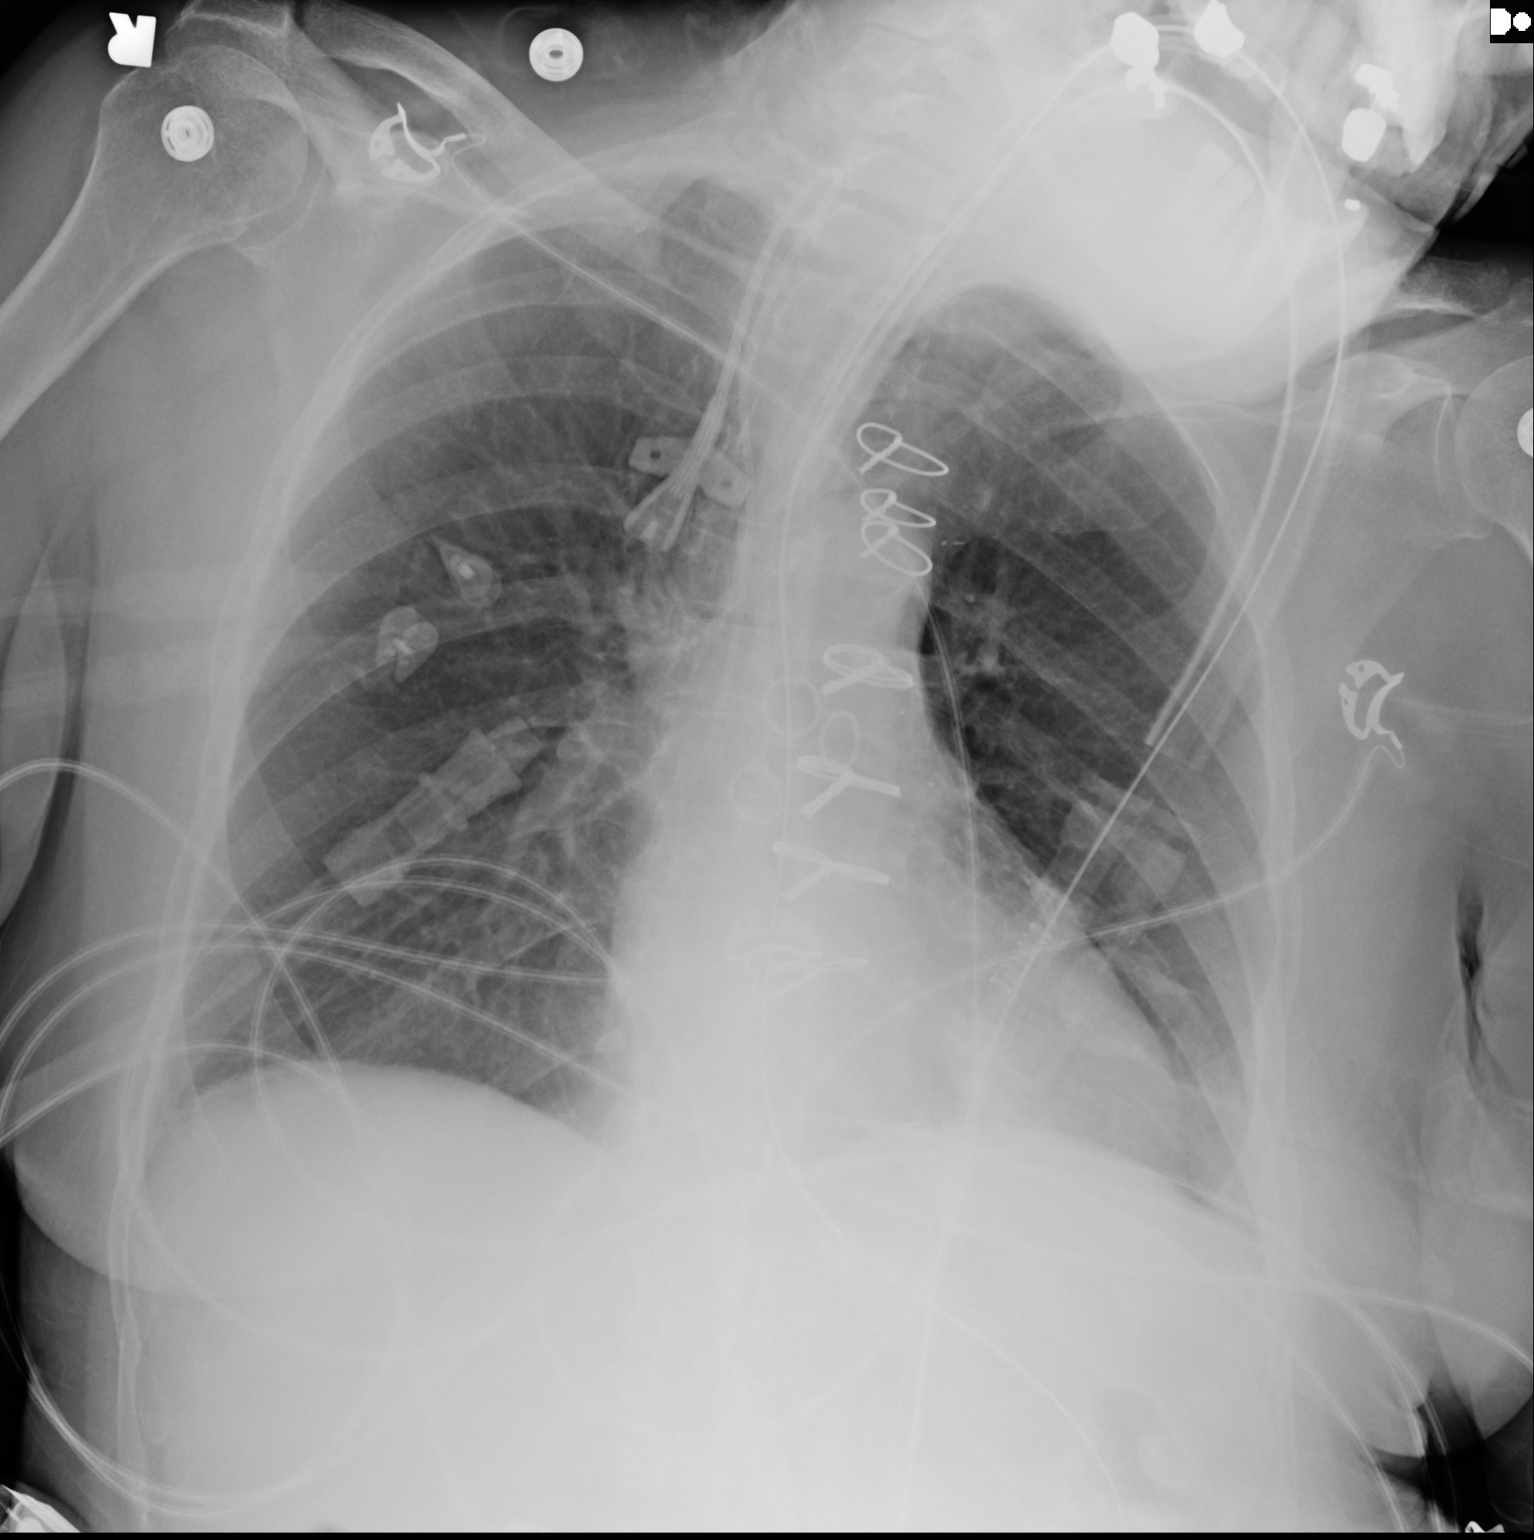

[1 of 1 positions shown; findings below may reference images not displayed]

FINDINGS: The tip the endotracheal tube is 4 cm above the carina. A right IJ
tunneled PICC is present. The tip of the catheter overlies the
distal SVC. A gastric tube is also present. The tip lies below the
diaphragm and field-of-view, presumably within the stomach. Patient
is status post median sternotomy with evidence of prior multivessel
CABG. The cardiac silhouette and mediastinal contours remain
unchanged. Atherosclerotic calcifications again noted in the
transverse aorta. No significant pulmonary edema, pleural effusion,
pneumothorax or focal airspace consolidation. There is likely mild
bibasilar subsegmental atelectasis. No acute osseous abnormality.
IMPRESSION: 1. Stable and satisfactory support apparatus.
2. No significant interval change in the appearance of the chest.
Persistent mild bibasilar atelectasis.

## 2016-05-18 MED ORDER — ALTEPLASE 2 MG IJ SOLR
2.0000 mg | Freq: Once | INTRAMUSCULAR | Status: DC | PRN
  Filled 2016-05-18: qty 2

## 2016-05-18 MED ORDER — SODIUM CHLORIDE 0.9 % IV SOLN: 100.0000 mL | INTRAVENOUS | Status: DC | PRN

## 2016-05-18 MED ORDER — HEPARIN SODIUM (PORCINE) 1000 UNIT/ML DIALYSIS
1000.0000 [IU] | INTRAMUSCULAR | Status: DC | PRN
  Filled 2016-05-18: qty 1

## 2016-05-18 MED ORDER — POLYETHYLENE GLYCOL 3350 17 G PO PACK
17.0000 g | PACK | Freq: Every day | ORAL | Status: DC
  Administered 2016-05-18 – 2016-05-25 (×2): 17 g
  Filled 2016-05-18 (×3): qty 1

## 2016-05-18 MED ORDER — VITAL AF 1.2 CAL PO LIQD
1000.0000 mL | ORAL | Status: DC
  Administered 2016-05-18 – 2016-05-19 (×2): 1000 mL
  Filled 2016-05-18 (×7): qty 1000

## 2016-05-18 NOTE — Progress Notes (Signed)
HR 130-140 ST x 5 min, Pt self converted to SR rate 90's. Will continue to monitor. Georgina Peer, RN 05/18/2016 2:53 PM

## 2016-05-18 NOTE — Progress Notes (Signed)
Nutrition Follow-up   Consult to initiate tube feeding received.   INTERVENTION:  Vital AF @ 50 ml/hr (1200 ml/ day) via OGT.  Tube feeding regimen provides 1440 kcal, 90 grams of protein, and 973 ml of H2O.   Recommend consider bowel regimen: NO BM x since 10/29    NUTRITION DIAGNOSIS:  Increased protein/energy needs related to ESRD with HD as evidenced by est needs  GOAL:  Pt to meet >/= 90% of their estimated nutrition needs     MONITOR:  Po intake, labs and wt trends     REASON FOR ASSESSMENT:   Ventilator    ASSESSMENT:  [name] who presents with chest pain, nausea and dyspnea. She has hx of ESRD with HD (5 yrs per pt), CAD, GERD, CHF, HTN.   Ms Schendel remains intubated on ventilator support since 11/1 after becoming bradycardic and non-responsive. She has an OGT (16 fr) placed. She has failed weaning attempts and tube feedings are being initiated.    MV: 5 L/min Temp (24hrs), Avg:97.1 F (36.2 C), Min:96.7 F (35.9 C), Max:97.3 F (36.3 C) Her BP-119/72  Recent Labs Lab 05/11/16 0817 05/11/16 1410  05/13/16 0430 05/14/16 0408  05/16/16 0435 05/17/16 0437 05/18/16 0139  NA 133* 137  < > 138 140  < > 140 142 137  K 6.3* 4.0  < > 4.4 4.4  < > 3.9 4.4 3.6  CL 92* 96*  < > 96* 96*  < > 93* 96* 97*  CO2 17* 27  < > 27 26  < > 26 23 26   BUN 50* 19  < > 47* 70*  < > 70* 97* 43*  CREATININE 9.09* 4.08*  < > 6.74* 9.58*  < > 9.57* 12.31* 7.28*  CALCIUM 10.5* 9.2  < > 8.4* 8.6*  < > 9.2 9.4 9.0  MG 2.0  --   --   --   --   --   --   --   --   PHOS  --  4.2  --  6.1* 7.3*  --   --   --   --   GLUCOSE 235* 100*  < > 127* 108*  < > 85 85 170*  < > = values in this interval not displayed.   Labs: BUN 43, Cr 7.28, Phos 7.3, glucose 170 CBG (last 3)   Recent Labs  05/17/16 2359 05/18/16 0355 05/18/16 0731  GLUCAP 95 114* 101*   Meds/ vitamins: Fosrenol, Rena-Vite, PPI   Diet Order:  Diet NPO time specified  Skin:  Reviewed, no  issues  Last BM:  10/29  Height:   Ht Readings from Last 1 Encounters:  05/17/16 5\' 1"  (1.549 m)    Weight:   Wt Readings from Last 1 Encounters:  05/17/16 118 lb 6.2 oz (53.7 kg)  Admit wt- 60.3 kg  Ideal Body Weight:  48 kg  BMI:  Body mass index is 22.37 kg/m.  Re-estimated Nutritional Needs:   Kcal:  2620-3559  Protein:  88-95 gr  Fluid:  1.2 liters daily   EDUCATION NEEDS:  None at this time   Colman Cater MS,RD,CSG,LDN Office: #[phone] Pager: [phone]

## 2016-05-18 NOTE — Progress Notes (Signed)
RT note- Patient weaned for 1.5 hours on PS+8/PEEP+5. Tolerated well, low VE and RR, placed back to full support.

## 2016-05-18 NOTE — Progress Notes (Signed)
Patient Name: Shawna [name] Date of Encounter: 05/18/2016  Primary Cardiologist: Dr. Marca Ancona Problem List     Principal Problem:   Hypertensive crisis Active Problems:   Coronary atherosclerosis of native coronary artery   ESRD on hemodialysis (HCC)   Anemia of chronic kidney failure   Chronic diastolic CHF (congestive heart failure) (HCC)   Chest pain   Acute respiratory failure ([place])   Hypertensive crisis without congestive heart failure   Cardiac arrest [facility])   Palliative care encounter   DNR (do not resuscitate) discussion   Goals of care, counseling/discussion     Subjective   Patient on vent  Inpatient Medications    Scheduled Meds: . amiodarone  200 mg Oral Daily  . aspirin EC  81 mg Oral Daily  . chlorhexidine gluconate (MEDLINE KIT)  15 mL Mouth Rinse BID  . famotidine (PEPCID) IV  20 mg Intravenous Q24H  . mouth rinse  15 mL Mouth Rinse QID  . multivitamin  1 tablet Oral Daily  . NIFEdipine  30 mg Oral Daily  . piperacillin-tazobactam (ZOSYN)  IV  3.375 g Intravenous Q12H  . [START ON 05/19/2016] vancomycin  500 mg Intravenous Q T,Th,Sa-HD   Continuous Infusions: . heparin 950 Units/hr (05/18/16 0022)   PRN Meds: sodium chloride, sodium chloride, sodium chloride, sodium chloride, acetaminophen, acetaminophen, ALPRAZolam, fentaNYL (SUBLIMAZE) injection, fentaNYL (SUBLIMAZE) injection, fluticasone, ipratropium-albuterol, lidocaine (PF), lidocaine-prilocaine, midazolam, ondansetron (ZOFRAN) IV, pentafluoroprop-tetrafluoroeth   Vital Signs    Vitals:   05/18/16 0400 05/18/16 0407 05/18/16 0500 05/18/16 0600  BP: 129/62  (!) 85/40 92/80  Pulse: 87  91 93  Resp: (!) _0 Temp: 97.1 F (36.2 C)     TempSrc: Axillary     SpO2: 100% 100% 100% 99%  Weight:   120 lb 13 oz (54.8 kg)   Height:        Intake/Output Summary (Last 24 hours) at 05/18/16 0757 Last data filed at 05/18/16 0400  Gross per 24 hour  Intake            34.52 ml    Output                0 ml  Net            34.52 ml   Filed Weights   05/17/16 0500 05/17/16 0830 05/18/16 0500  Weight: 118 lb 6.2 oz (53.7 kg) 118 lb 6.2 oz (53.7 kg) 120 lb 13 oz (54.8 kg)   Physical Exam:   GEN: Intubated  Neck: Supple, no JVD, carotid bruits, or masses. Cardiac: RRR, no murmurs, rubs, or gallops. No clubbing, cyanosis, edema.  Radials/DP/PT 2+ and equal bilaterally.  Respiratory: Decreased breath sounds without rales. GI: Soft, nontender, nondistended, BS + x 4. MS: no deformity or atrophy. Skin: warm and dry, no rash. Neuro:  On vent, sedated  Labs    CBC  Recent Labs  05/17/16 0437 05/18/16 0139  WBC 11.0* 10.4  HGB 10.5* 9.4*  HCT 33.4* 30.2*  MCV 98.8 99.3  PLT 355 563   Basic Metabolic Panel  Recent Labs  05/17/16 0437 05/18/16 0139  NA 142 137  K 4.4 3.6  CL 96* 97*  CO2 23 26  GLUCOSE 85 170*  BUN 97* 43*  CREATININE 12.31* 7.28*  CALCIUM 9.4 9.0     Telemetry    NSR - Personally Reviewed  05/18/2016   ECG      Radiology    Dg  Hand 2 View Right  Result Date: 05/18/2016 CLINICAL DATA:  Right hand swelling EXAM: RIGHT HAND - 2 VIEW COMPARISON:  None. FINDINGS: There is osteoarthritis at the base of the thumb metacarpal with subchondral sclerosis and cystic change at the base of the first proximal phalanx. Lesser degree of osteoarthritis involving the DIP joints of the second through fifth digits more so involving the index finger. Slight joint space narrowing of the third and fifth MCP. No chondrocalcinosis. There is diffuse soft tissue swelling about the hand more so dorsally. No frank bone destruction is seen. Carpal bones are grossly intact. IMPRESSION: Nonspecific soft tissue swelling of the visualized hand more so dorsally. Osteoarthritis at the base of the thumb metacarpal, DIP joints of the second through fifth digits and with slight joint space narrowing of the third and fifth MCP. Electronically Signed   By: Ashley Royalty M.D.   On: 05/18/2016 03:03   Dg Chest Port 1 View  Result Date: 05/17/2016 CLINICAL DATA:  Hypoxia EXAM: PORTABLE CHEST 1 VIEW COMPARISON:  May 13, 2016 FINDINGS: Endotracheal tube tip is 5.6 cm above the carina. Nasogastric tube tip and side port are below the diaphragm. Central catheter tip is in the superior vena cava. No pneumothorax. There is no edema or consolidation. The heart size and pulmonary vascularity are normal. No adenopathy. Patient is status post coronary artery bypass grafting. IMPRESSION: Tube and catheter positions as described without pneumothorax. No edema or consolidation. Stable cardiac silhouette. Electronically Signed   By: Lowella Grip III M.D.   On: 05/17/2016 09:43    Cardiac Studies   Echocardiogram 09/09/2015 Left ventricle: The cavity size was normal. Wall thickness was   increased in a pattern of mild LVH. Systolic function was normal.   The estimated ejection fraction was in the range of 60% to 65%.   Wall motion was normal; there were no regional wall motion   abnormalities. Features are consistent with a pseudonormal left   ventricular filling pattern, with concomitant abnormal relaxation   and increased filling pressure (grade 2 diastolic dysfunction). - Mitral valve: Mildly thickened leaflets . There was moderate   regurgitation. - Left atrium: The atrium was mildly dilated. - Right atrium: Central venous pressure (est): 8 mm Hg. - Tricuspid valve: There was mild regurgitation. - Pulmonary arteries: PA peak pressure: 45 mm Hg (S). - Pericardium, extracardiac: There was no pericardial effusion.   Impressions:   - Mild LVH with LVEF 60-65%. Grade 2 diastolic dysfunction with   increased LV filling pressure. Mild left atrial enlargement.   Mildly thickened mitral leaflets with moderate mitral   regurgitation. Mild tricuspid regurgitation with PASP 45 mmHg.   Patient Profile     75 year old female admitted with hypertensive urgency,  pulmonary edema, PEA arrest in setting of hyperkalemia,currently intubated, with history of CAD prior stenting CABG, carotid stenosis, chronic diastolic heart failure, PSVT on amiodarone, who was admitted with chest pain.     Assessment & Plan        1. Hypertensive Emergency: Now off labetalol and nitroglycerin drips. Remains on nifedipine 30 mg via orogastric tube BP is stable today. Most recent echocardiogram reveals normal LV systolic function with grade 2 diastolic dysfunction and LVH.    2. Demand ischemia: in the setting of hypertension. Unlikely ACS. Troponin peak at 1.53 trending downward. No plans for invasive testing at this time.   4. ESRD: Dialysis per nephrology.   5. Respiratory failure on Vent, could not wean yesterday  6.PEA arrest in setting of hyperkalemia. Progressive sinus bradycardia into junctional brady likely due to severe hyperkalemia.     Signed, Ermalinda Barrios, PA-C  05/18/2016, 7:57 AM   Patient examined chart reviewed on vent BP ok no excessive bradycardia Ongoing dialysis and weaning efforts Nothing else to add from cardiology perspective will sign off  Jenkins Rouge

## 2016-05-18 NOTE — Progress Notes (Signed)
KIAIRA POINTER  MRN: [id number]  DOB/AGE: 10/20/1940 75 y.o.  Primary Care Physician:HOWARD, Lennette Bihari, MD  Admit date: 05/08/2016  Chief Complaint:  Chief Complaint  Patient presents with  . Chest Pain    S-Pt presented on  05/08/2016 with  Chief Complaint  Patient presents with  . Chest Pain  .    Pt intubated ,unable to offer any complaints.     Pt does open eyes and maintains eye contact   Meds . amiodarone  200 mg Oral Daily  . aspirin EC  81 mg Oral Daily  . chlorhexidine gluconate (MEDLINE KIT)  15 mL Mouth Rinse BID  . famotidine (PEPCID) IV  20 mg Intravenous Q24H  . mouth rinse  15 mL Mouth Rinse QID  . multivitamin  1 tablet Oral Daily  . NIFEdipine  30 mg Oral Daily  . piperacillin-tazobactam (ZOSYN)  IV  3.375 g Intravenous Q12H  . [START ON 05/19/2016] vancomycin  500 mg Intravenous Q T,Th,Sa-HD      Physical Exam: Vital signs in last 24 hours: Temp:  [97 F (36.1 C)-97.8 F (36.6 C)] 97.8 F (36.6 C) (11/08 0730) Pulse Rate:  [48-123] 93 (11/08 0900) Resp:  [0-30] 17 (11/08 0600) BP: (74-204)/(40-132) 119/72 (11/08 0900) SpO2:  [85 %-100 %] 100 % (11/08 0900) FiO2 (%):  [35 %] 35 % (11/08 0900) Weight:  [120 lb 13 oz (54.8 kg)] 120 lb 13 oz (54.8 kg) (11/08 0500) Weight change: 0 lb (0 kg) Last BM Date: 05/08/16  Intake/Output from previous day: 11/07 0701 - 11/08 0700 In: 34.5 [I.V.:34.5] Out: 0  No intake/output data recorded.   Physical Exam: General- pt is awake, opens eyes, follows commands Resp- No acute REsp distress,Bs + B/L. Minimal ronchi CVS- S1S2 regular in rate and rhythm GIT- BS+, soft, NT, ND EXT- NO LE Edema, NO Cyanosis Access -left AVF +  Lab Results: CBC  Recent Labs  05/17/16 0437 05/18/16 0139  WBC 11.0* 10.4  HGB 10.5* 9.4*  HCT 33.4* 30.2*  PLT 355 338    BMET  Recent Labs  05/17/16 0437 05/18/16 0139  NA 142 137  K 4.4 3.6  CL 96* 97*  CO2 23 26  GLUCOSE 85 170*  BUN 97* 43*  CREATININE 12.31*  7.28*  CALCIUM 9.4 9.0    MICRO Recent Results (from the past 240 hour(s))  MRSA PCR Screening     Status: None   Collection Time: 05/09/16  2:38 AM  Result Value Ref Range Status   MRSA by PCR NEGATIVE NEGATIVE Final    Comment:        The GeneXpert MRSA Assay (FDA approved for NASAL specimens only), is one component of a comprehensive MRSA colonization surveillance program. It is not intended to diagnose MRSA infection nor to guide or monitor treatment for MRSA infections.   Culture, blood (routine x 2)     Status: None (Preliminary result)   Collection Time: 05/17/16 10:49 PM  Result Value Ref Range Status   Specimen Description BLOOD PICC  Final   Special Requests BOTTLES DRAWN AEROBIC AND ANAEROBIC 10CC EACH  Final   Culture NO GROWTH < 12 HOURS  Final   Report Status PENDING  Incomplete  Culture, blood (routine x 2)     Status: None (Preliminary result)   Collection Time: 05/17/16 10:51 PM  Result Value Ref Range Status   Specimen Description BLOOD PICC  Final   Special Requests BOTTLES DRAWN AEROBIC AND ANAEROBIC [place]  Final  Culture NO GROWTH < 12 HOURS  Final   Report Status PENDING  Incomplete      Lab Results  Component Value Date   PTH 709.7 (H) 05/29/2013   CALCIUM 9.0 05/18/2016   CAION 1.07 (L) 09/08/2015   PHOS 7.3 (H) 05/14/2016       Impression: 1)Renal ESRd on HD On TUes/Thurs/Saturday  schedule Pt was  dialyzed yesterday NO need of HD today   2)HTN BP at goal  Medication- On calcium blockers   3)Anemia in ESRD the goal for HGb is 9--11.  on epo during HD   4)CKD Mineral-Bone Disorder Secondary Hyperparathyroidism present  On Cinacalcet Phosphorus is at goal.    Sec to unable to take PO ( Intubated)  5)CVS-Admitted with Bradycardia/S/P Code Blue  Primary MD following  6)Electrolytes Normokalemic Normonatremic   7)Acid base Co2 at goal  8) Resp- Intubated Primary team following      Plan:  NO  need of Hd today. Will dialyze in am   Chelsey Kimberley S 05/18/2016, 10:27 AM

## 2016-05-18 NOTE — Progress Notes (Signed)
ANTICOAGULATION CONSULT NOTE - follow up  Pharmacy Consult for Heparin Indication: R/O DVT  Patient Measurements: Height: 5' 1" (154.9 cm) Weight: 120 lb 13 oz (54.8 kg) IBW/kg (Calculated) : 47.8  HEPARIN DW (KG): 53.7   Vital Signs: Temp: 98.8 F (37.1 C) (11/08 1400) Temp Source: Axillary (11/08 1400) BP: 122/68 (11/08 1300) Pulse Rate: 90 (11/08 1300)  Labs:  Recent Labs  05/16/16 0435 05/17/16 0437 05/18/16 0139 05/18/16 0716 05/18/16 1354  HGB 10.4* 10.5* 9.4*  --   --   HCT 33.5* 33.4* 30.2*  --   --   PLT 304 355 338  --   --   HEPARINUNFRC  --   --   --  0.94* 0.66  CREATININE 9.57* 12.31* 7.28*  --   --    Estimated Creatinine Clearance: 5 mL/min (by C-G formula based on SCr of 7.28 mg/dL (H)).  Medical History: Past Medical History:  Diagnosis Date  . Anxiety   . Arthritis   . AVM (arteriovenous malformation) of colon   . CAD (coronary artery disease)    a. 12/2011 NSTEMI/Cath/PCI LCX (2.25x14 Resolute DES) & D1 (2.25x22 Resolute DES);  b. 01/2012 Cath/PCI: LM 30, LAD 30p, 40-78m D1 stent ok, 99 in sm branch of diag, LCX patent stent, OM1 20, RCA 95 ost (4.0x12 Promus DES), EF 55%;  c. 04/2012 Lexi Cardiolite  EF 48%, small area of scar @ base/mid inflat wall with mild peri-infarct ischemia.; CABG 12/4  . Carotid artery disease ([place]    a. 656-31%LICA, 94/9702  . Chronic bronchitis [place]   . Chronic diastolic CH[place] ([place]estive heart failure) [place]    a. 02/2012 Echo EF 60-65%, nl wall motion, Gr 1 DD, mod MR  . Colon cancer ([place] 1992  . Esophageal stricture   . [place] ([place]    ESRD due to HTN, [facility] 2011 [facility]  Access is LUA AVF as of Sept 2014.   .Marland KitchenGERD (gastroesophageal reflux disease)   . High cholesterol 12/2011  . History of blood transfusion 07/2011; 12/2011; 01/2012 X 2; 04/2012  . History of gout   . History of lower GI bleeding   . Hypertension   . Iron deficiency  anemia   . Mitral regurgitation    a. Moderate by echo, 02/2012  . Myocardial infarction   . Ovarian cancer ([place] 1992  . Pneumonia ~ 2009  . PUD (peptic ulcer disease)   . TIA (transient ischemic attack)    Medications:  Scheduled:  . amiodarone  200 mg Oral Daily  . aspirin EC  81 mg Oral Daily  . chlorhexidine gluconate (MEDLINE KIT)  15 mL Mouth Rinse BID  . famotidine (PEPCID) IV  20 mg Intravenous Q24H  . mouth rinse  15 mL Mouth Rinse QID  . multivitamin  1 tablet Oral Daily  . NIFEdipine  30 mg Oral Daily  . piperacillin-tazobactam (ZOSYN)  IV  3.375 g Intravenous Q12H  . polyethylene glycol  17 g Per Tube Daily  . [START ON 05/19/2016] vancomycin  500 mg Intravenous Q T,Th,Sa-HD   Assessment: Okay for Protocol, r/o DVT.  Heparin level was above goal, heparin level reduced to 800 units/hr; heparin level is now therapeutic.    Goal of Therapy:  Heparin level 0.3-0.7 units/ml Monitor platelets by anticoagulation protocol: Yes   Plan:  Continue Heparin infusion at 800 units/hr Heparin level and CBC daily  HHart Robinsons  A 05/18/2016,3:36 PM

## 2016-05-18 NOTE — Consult Note (Signed)
[place] A. Merlene Laughter, MD     [email or web address]          Shawna [name] is an 75 y.o. female.   ASSESSMENT/PLAN: Failure to wean with the etiology being unclear at this time. The patient seemed to be awake enough that level of arousal should not be the etiology. This type of picture is most often seen in critical illness myopathy/neuropathy. [Risk factors are typically seen in acute intubation, use of neuromuscular blocking agents and steroids.] If this is the case, there are no known treatments other than observation. We will follow the patient and see how she progresses.  Moderate encephalopathy likely due to toxic metabolic etiologies.  Suggestion of right-leg weakness. Consider CT scan for further evaluation.       The patient is a 75 year old black female who presented to the hospital with acute chest pain syndrome dyspnea nausea. On presentation she was noted to have hypertensive crisis with blood pressure 2:30/130. Troponin 1 was slightly elevated. She also has a history of chronic renal failure. She was given appropriate drips to help with blood pressure control. It appears that during the course of the patient's hospitalization, she had a cardiopulmonary arrest and was coded for approximately 25 minutes. She rebounded well from this is seen to have only mild cognitive impairment afterwards. Unfortunate, she coded for another 5 minutes the following day during hemodialysis. She has been intubated but attempts to wean the patient off ventilation has not been successful because of apneic periods. The patient seems to remain awake but has had agitation and has been given ice needed doses of fentanyl and other sedatives.   GENERAL: The patient appears to be in moderate discomfort but in no acute distress. She is intubated and was just given sedatives.  HEENT: Supple. Atraumatic normocephalic.   ABDOMEN: soft  EXTREMITIES: No edema   BACK: Normal.  SKIN:  Normal by inspection.    MENTAL STATUS: She lays in bed with eyes closed, but she opens eyes to light sternal rub. She does not follow commands but she states her head yes and respond to her name.  CRANIAL NERVES: Pupils are equal, round and reactive to light; extra ocular movements are full, there is no significant nystagmus; upper and lower facial muscles are normal in strength and symmetric, there is no flattening of the nasolabial folds.  MOTOR: She moves upper extremities equally against gravity. She moves the left leg against gravity but the right leg is only 2/5.  COORDINATION: No tremors or dysmetria is observed.  REFLEXES: Deep tendon reflexes are symmetrical and normal. Babinski reflexes are flexor bilaterally.   SENSATION: She responds to painful stimuli bilaterally but somewhat less involving the right lower extremity.    Blood pressure 119/72, pulse 93, temperature 97.8 F (36.6 C), temperature source Axillary, resp. rate 17, height 5' 1"  (1.549 m), weight 120 lb 13 oz (54.8 kg), SpO2 100 %.  Past Medical History:  Diagnosis Date  . Anxiety   . Arthritis   . AVM (arteriovenous malformation) of colon   . CAD (coronary artery disease)    a. 12/2011 NSTEMI/Cath/PCI LCX (2.25x14 Resolute DES) & D1 (2.25x22 Resolute DES);  b. 01/2012 Cath/PCI: LM 30, LAD 30p, 40-45m D1 stent ok, 99 in sm branch of diag, LCX patent stent, OM1 20, RCA 95 ost (4.0x12 Promus DES), EF 55%;  c. 04/2012 Lexi Cardiolite  EF 48%, small area of scar @ base/mid inflat wall with mild peri-infarct ischemia.; CABG 12/4  . Carotid  artery disease ([place])    a. 69-62% LICA, 03/5283   . Chronic bronchitis ([place])   . Chronic diastolic CHF (congestive heart failure) ([place])    a. 02/2012 Echo EF 60-65%, nl wall motion, Gr 1 DD, mod MR  . Colon cancer ([place]) 1992  . Esophageal stricture   . ESRD on hemodialysis ([place])    ESRD due to HTN, started dialysis 2011 and gets HD at [facility] with Dr Hinda Lenis on MWF schedule.   Access is LUA AVF as of Sept 2014.   Marland Kitchen GERD (gastroesophageal reflux disease)   . High cholesterol 12/2011  . History of blood transfusion 07/2011; 12/2011; 01/2012 X 2; 04/2012  . History of gout   . History of lower GI bleeding   . Hypertension   . Iron deficiency anemia   . Mitral regurgitation    a. Moderate by echo, 02/2012  . Myocardial infarction   . Ovarian cancer ([place]) 1992  . Pneumonia ~ 2009  . PUD (peptic ulcer disease)   . TIA (transient ischemic attack)     Past Surgical History:  Procedure Laterality Date  . ABDOMINAL HYSTERECTOMY  1992  . APPENDECTOMY  06/1990  . AV FISTULA PLACEMENT  07/2009   left upper arm  . COLON RESECTION  1992  . CORONARY ANGIOPLASTY WITH STENT PLACEMENT  12/15/11   "2"  . CORONARY ANGIOPLASTY WITH STENT PLACEMENT  y/2013   "1; makes total of 3" (05/02/2012)  . CORONARY ARTERY BYPASS GRAFT  06/13/2012   Procedure: CORONARY ARTERY BYPASS GRAFTING (CABG);  Surgeon: Grace Isaac, MD;  Location: [facility];  Service: Open Heart Surgery;  Laterality: N/A;  cabg x four;  using left internal mammary artery, and left leg greater saphenous vein harvested endoscopically  . DILATION AND CURETTAGE OF UTERUS    . ESOPHAGOGASTRODUODENOSCOPY  01/20/2012   Procedure: ESOPHAGOGASTRODUODENOSCOPY (EGD);  Surgeon: Ladene Artist, MD,FACG;  Location: [facility] ENDOSCOPY;  Service: Endoscopy;  Laterality: N/A;  . ESOPHAGOGASTRODUODENOSCOPY N/A 03/26/2013   Procedure: ESOPHAGOGASTRODUODENOSCOPY (EGD);  Surgeon: Irene Shipper, MD;  Location: [facility] ENDOSCOPY;  Service: Endoscopy;  Laterality: N/A;  . ESOPHAGOGASTRODUODENOSCOPY N/A 04/30/2015   Procedure: ESOPHAGOGASTRODUODENOSCOPY (EGD);  Surgeon: Rogene Houston, MD;  [facility]O [facility];  Service: Endoscopy;  Laterality: N/A;  1pm - moved to 10/20 @ 1:10  . INTRAOPERATIVE TRANSESOPHAGEAL ECHOCARDIOGRAM  06/13/2012   Procedure: INTRAOPERATIVE TRANSESOPHAGEAL ECHOCARDIOGRAM;  Surgeon: Grace Isaac, MD;  Location: [name];   Service: Open Heart Surgery;  Laterality: N/A;  . LEFT HEART CATHETERIZATION WITH CORONARY ANGIOGRAM N/A 12/15/2011   Procedure: LEFT HEART CATHETERIZATION WITH CORONARY ANGIOGRAM;  Surgeon: Burnell Blanks, MD;  Location: [facility] CATH LAB;  Service: Cardiovascular;  Laterality: N/A;  . LEFT HEART CATHETERIZATION WITH CORONARY ANGIOGRAM N/A 01/10/2012   Procedure: LEFT HEART CATHETERIZATION WITH CORONARY ANGIOGRAM;  Surgeon: Peter M Martinique, MD;  Location: [facility] CATH LAB;  Service: Cardiovascular;  Laterality: N/A;  . LEFT HEART CATHETERIZATION WITH CORONARY ANGIOGRAM N/A 06/08/2012   Procedure: LEFT HEART CATHETERIZATION WITH CORONARY ANGIOGRAM;  Surgeon: Burnell Blanks, MD;  Location: [facility] CATH LAB;  Service: Cardiovascular;  Laterality: N/A;  . LEFT HEART CATHETERIZATION WITH CORONARY/GRAFT ANGIOGRAM N/A 12/10/2013   Procedure: LEFT HEART CATHETERIZATION WITH Beatrix Fetters;  Surgeon: [name], MD;  Location: [facility] CATH LAB;  Service: Cardiovascular;  Laterality: N/A;  . OVARY SURGERY     ovarian cancer  . SHUNTOGRAM N/A 10/15/2013   Procedure: Fistulogram;  Surgeon: Serafina Mitchell, MD;  Location: [facility] CATH LAB;  Service: Cardiovascular;  Laterality: N/A;  . THROMBECTOMY / ARTERIOVENOUS GRAFT REVISION  2011   left upper arm  . TUBAL LIGATION  1980's    Family History  Problem Relation Age of Onset  . Heart disease Mother     Heart Disease before age 13  . Hyperlipidemia Mother   . Hypertension Mother   . Diabetes Mother   . Heart attack Mother   . Heart disease Father     Heart Disease before age 66  . Hyperlipidemia Father   . Hypertension Father   . Diabetes Father   . Diabetes Sister   . Hypertension Sister   . Diabetes Brother   . Hyperlipidemia Brother   . Heart attack Brother   . Hypertension Sister   . Heart attack Brother   . Other      noncontributory for early CAD  . Colon cancer Neg Hx   . Esophageal cancer Neg Hx   . Liver disease Neg Hx   .  Kidney disease Neg Hx   . Colon polyps Neg Hx     Social History:  reports that she has never smoked. She has never used smokeless tobacco. She reports that she does not drink alcohol or use drugs.  Allergies:    Medications: Prior to Admission medications   Medication Sig Start Date End Date Taking? Authorizing Provider  ALPRAZolam Duanne Moron) 0.25 MG tablet Take 1-2 tablets by mouth at bedtime as needed for sleep.  11/19/15  Yes Historical Provider, MD  amiodarone (PACERONE) 200 MG tablet Take 1 tablet (200 mg total) by mouth daily. 09/23/15  Yes Herminio Commons, MD  aspirin EC 81 MG tablet Take 81 mg by mouth daily.    Yes Historical Provider, MD  cloNIDine (CATAPRES) 0.1 MG tablet Take 1 tablet (0.1 mg total) by mouth 2 (two) times daily. 04/08/16  Yes Orvan Falconer, MD  epoetin alfa (EPOGEN,PROCRIT) 37290 UNIT/ML injection Inject 1 mL (10,000 Units total) into the vein every Monday, Wednesday, and Friday with hemodialysis. 04/11/16  Yes Orvan Falconer, MD  fluticasone (FLONASE) 50 MCG/ACT nasal spray Place 2 sprays into the nose daily as needed for allergies.    Yes Historical Provider, MD  labetalol (NORMODYNE) 200 MG tablet Take 1 tablet (200 mg total) by mouth 2 (two) times daily. 04/08/16  Yes Orvan Falconer, MD  lanthanum (FOSRENOL) 1000 MG chewable tablet Chew 1 tablet (1,000 mg total) by mouth 3 (three) times daily with meals. 04/08/16  Yes Orvan Falconer, MD  multivitamin (RENA-VIT) TABS tablet Take 1 tablet by mouth daily.   Yes Historical Provider, MD  nitroGLYCERIN (NITROSTAT) 0.4 MG SL tablet Place 1 tablet (0.4 mg total) under the tongue every 5 (five) minutes as needed for chest pain. 04/13/16 07/12/16 Yes Herminio Commons, MD  omeprazole (PRILOSEC) 20 MG capsule TAKE ONE CAPSULE BY MOUTH ONCE DAILY 04/15/16  Yes Butch Penny, NP  PROAIR HFA 108 (90 BASE) MCG/ACT inhaler Inhale 1 puff into the lungs every 6 (six) hours as needed for wheezing or shortness of breath.  01/09/15  Yes Historical Provider, MD    SENSIPAR 30 MG tablet Take 30 mg by mouth daily with supper.  12/21/12  Yes Historical Provider, MD  simvastatin (ZOCOR) 20 MG tablet Take 1 tablet (20 mg total) by mouth at bedtime. 04/04/16  Yes Lendon Colonel, NP    Scheduled Meds: . amiodarone  200 mg Oral Daily  . aspirin EC  81 mg  Oral Daily  . chlorhexidine gluconate (MEDLINE KIT)  15 mL Mouth Rinse BID  . famotidine (PEPCID) IV  20 mg Intravenous Q24H  . mouth rinse  15 mL Mouth Rinse QID  . multivitamin  1 tablet Oral Daily  . NIFEdipine  30 mg Oral Daily  . piperacillin-tazobactam (ZOSYN)  IV  3.375 g Intravenous Q12H  . [START ON 05/19/2016] vancomycin  500 mg Intravenous Q T,Th,Sa-HD   Continuous Infusions: . heparin 800 Units/hr (05/18/16 0900)   PRN Meds:.sodium chloride, sodium chloride, sodium chloride, sodium chloride, acetaminophen, acetaminophen, ALPRAZolam, fentaNYL (SUBLIMAZE) injection, fentaNYL (SUBLIMAZE) injection, fluticasone, ipratropium-albuterol, lidocaine (PF), lidocaine-prilocaine, midazolam, ondansetron (ZOFRAN) IV, pentafluoroprop-tetrafluoroeth     Results for orders placed or performed during the hospital encounter of 05/08/16 (from the past 48 hour(s))  Glucose, capillary     Status: None   Collection Time: 05/16/16 11:18 AM  Result Value Ref Range   Glucose-Capillary 98 65 - 99 mg/dL  Glucose, capillary     Status: None   Collection Time: 05/16/16  4:12 PM  Result Value Ref Range   Glucose-Capillary 88 65 - 99 mg/dL  Glucose, capillary     Status: None   Collection Time: 05/16/16  7:40 PM  Result Value Ref Range   Glucose-Capillary 90 65 - 99 mg/dL   Comment 1 Notify RN   Glucose, capillary     Status: None   Collection Time: 05/17/16 12:00 AM  Result Value Ref Range   Glucose-Capillary 74 65 - 99 mg/dL   Comment 1 Notify RN   Glucose, capillary     Status: None   Collection Time: 05/17/16  4:23 AM  Result Value Ref Range   Glucose-Capillary 80 65 - 99 mg/dL   Comment 1 Notify RN    Basic metabolic panel     Status: Abnormal   Collection Time: 05/17/16  4:37 AM  Result Value Ref Range   Sodium 142 135 - 145 mmol/L   Potassium 4.4 3.5 - 5.1 mmol/L   Chloride 96 (L) 101 - 111 mmol/L   CO2 23 22 - 32 mmol/L   Glucose, Bld 85 65 - 99 mg/dL   BUN 97 (H) 6 - 20 mg/dL   Creatinine, Ser 12.31 (H) 0.44 - 1.00 mg/dL   Calcium 9.4 8.9 - 10.3 mg/dL   GFR calc non Af Amer 3 (L) >60 mL/min   GFR calc Af Amer 3 (L) >60 mL/min    Comment: (NOTE) The eGFR has been calculated using the CKD EPI equation. This calculation has not been validated in all clinical situations. eGFR's persistently <60 mL/min signify possible Chronic Kidney Disease.   CBC     Status: Abnormal   Collection Time: 05/17/16  4:37 AM  Result Value Ref Range   WBC 11.0 (H) 4.0 - 10.5 K/uL   RBC 3.38 (L) 3.87 - 5.11 MIL/uL   Hemoglobin 10.5 (L) 12.0 - 15.0 g/dL   HCT 33.4 (L) 36.0 - 46.0 %   MCV 98.8 78.0 - 100.0 fL   MCH 31.1 26.0 - 34.0 pg   MCHC 31.4 30.0 - 36.0 g/dL   RDW 17.0 (H) 11.5 - 15.5 %   Platelets 355 150 - 400 K/uL  Glucose, capillary     Status: None   Collection Time: 05/17/16  7:25 AM  Result Value Ref Range   Glucose-Capillary 73 65 - 99 mg/dL  Glucose, capillary     Status: Abnormal   Collection Time: 05/17/16 12:22 PM  Result Value Ref  Range   Glucose-Capillary 37 (LL) 65 - 99 mg/dL   Comment 1 Notify RN   Glucose, capillary     Status: Abnormal   Collection Time: 05/17/16 12:42 PM  Result Value Ref Range   Glucose-Capillary 35 (LL) 65 - 99 mg/dL   Comment 1 Notify RN   Glucose, capillary     Status: None   Collection Time: 05/17/16 12:43 PM  Result Value Ref Range   Glucose-Capillary 93 65 - 99 mg/dL  Glucose, capillary     Status: Abnormal   Collection Time: 05/17/16  4:07 PM  Result Value Ref Range   Glucose-Capillary 61 (L) 65 - 99 mg/dL  Glucose, capillary     Status: Abnormal   Collection Time: 05/17/16  5:00 PM  Result Value Ref Range   Glucose-Capillary 173  (H) 65 - 99 mg/dL  Glucose, capillary     Status: None   Collection Time: 05/17/16  7:53 PM  Result Value Ref Range   Glucose-Capillary 95 65 - 99 mg/dL   Comment 1 Notify RN   Culture, blood (routine x 2)     Status: None (Preliminary result)   Collection Time: 05/17/16 10:49 PM  Result Value Ref Range   Specimen Description BLOOD PICC    Special Requests BOTTLES DRAWN AEROBIC AND ANAEROBIC 10CC EACH    Culture NO GROWTH < 12 HOURS    Report Status PENDING   Culture, blood (routine x 2)     Status: None (Preliminary result)   Collection Time: 05/17/16 10:51 PM  Result Value Ref Range   Specimen Description BLOOD PICC    Special Requests BOTTLES DRAWN AEROBIC AND ANAEROBIC 10CC EACH    Culture NO GROWTH < 12 HOURS    Report Status PENDING   Lactic acid, plasma     Status: None   Collection Time: 05/17/16 10:51 PM  Result Value Ref Range   Lactic Acid, Venous 1.2 0.5 - 1.9 mmol/L  Glucose, capillary     Status: None   Collection Time: 05/17/16 11:59 PM  Result Value Ref Range   Glucose-Capillary 95 65 - 99 mg/dL   Comment 1 Notify RN   Basic metabolic panel     Status: Abnormal   Collection Time: 05/18/16  1:39 AM  Result Value Ref Range   Sodium 137 135 - 145 mmol/L   Potassium 3.6 3.5 - 5.1 mmol/L    Comment: DELTA CHECK NOTED   Chloride 97 (L) 101 - 111 mmol/L   CO2 26 22 - 32 mmol/L   Glucose, Bld 170 (H) 65 - 99 mg/dL   BUN 43 (H) 6 - 20 mg/dL   Creatinine, Ser 7.28 (H) 0.44 - 1.00 mg/dL    Comment: DELTA CHECK NOTED   Calcium 9.0 8.9 - 10.3 mg/dL   GFR calc non Af Amer 5 (L) >60 mL/min   GFR calc Af Amer 6 (L) >60 mL/min    Comment: (NOTE) The eGFR has been calculated using the CKD EPI equation. This calculation has not been validated in all clinical situations. eGFR's persistently <60 mL/min signify possible Chronic Kidney Disease.    Anion gap 14 5 - 15  CBC     Status: Abnormal   Collection Time: 05/18/16  1:39 AM  Result Value Ref Range   WBC 10.4 4.0 -  10.5 K/uL   RBC 3.04 (L) 3.87 - 5.11 MIL/uL   Hemoglobin 9.4 (L) 12.0 - 15.0 g/dL   HCT 30.2 (L) 36.0 - 46.0 %  MCV 99.3 78.0 - 100.0 fL   MCH 30.9 26.0 - 34.0 pg   MCHC 31.1 30.0 - 36.0 g/dL   RDW 16.7 (H) 11.5 - 15.5 %   Platelets 338 150 - 400 K/uL  Lactic acid, plasma     Status: None   Collection Time: 05/18/16  1:39 AM  Result Value Ref Range   Lactic Acid, Venous 1.1 0.5 - 1.9 mmol/L  Glucose, capillary     Status: Abnormal   Collection Time: 05/18/16  3:55 AM  Result Value Ref Range   Glucose-Capillary 114 (H) 65 - 99 mg/dL   Comment 1 Notify RN   Blood gas, arterial     Status: Abnormal   Collection Time: 05/18/16  4:35 AM  Result Value Ref Range   FIO2 35.00    Delivery systems VENTILATOR    Mode PRESSURE REGULATED VOLUME CONTROL    VT 380 mL   LHR 14 resp/min   Peep/cpap 5.0 cm H20   pH, Arterial 7.430 7.350 - 7.450   pCO2 arterial 41.7 32.0 - 48.0 mmHg   pO2, Arterial 133 (H) 83.0 - 108.0 mmHg   Bicarbonate 27.1 20.0 - 28.0 mmol/L   Acid-Base Excess 3.2 (H) 0.0 - 2.0 mmol/L   O2 Saturation 97.7 %   Patient temperature 37.0    Collection site RIGHT RADIAL    Drawn by [id number]    Sample type ARTERIAL DRAW    Allens test (pass/fail) POSITIVE (A) PASS  Heparin level (unfractionated)     Status: Abnormal   Collection Time: 05/18/16  7:16 AM  Result Value Ref Range   Heparin Unfractionated 0.94 (H) 0.30 - 0.70 IU/mL    Comment:        IF HEPARIN RESULTS ARE BELOW EXPECTED VALUES, AND PATIENT DOSAGE HAS BEEN CONFIRMED, SUGGEST FOLLOW UP TESTING OF ANTITHROMBIN III LEVELS.   Glucose, capillary     Status: Abnormal   Collection Time: 05/18/16  7:31 AM  Result Value Ref Range   Glucose-Capillary 101 (H) 65 - 99 mg/dL   Comment 1 Notify RN    Comment 2 Document in Chart     Studies/Results:     [place] Sliva A. Merlene Laughter, M.D.  Diplomate, [employer] adviser of Psychiatry and Neurology ( Neurology). 05/18/2016, 9:48 AM

## 2016-05-18 NOTE — Progress Notes (Signed)
Subjective: Events of last night noted. Workup is underway and she is currently on heparin. She remains intubated and on the ventilator. She has continued to have apnea when we attempted to wean her. Blood gas does not show marked overventilation so I don't think that is the explanation. She is awake and responsive.  Objective: Vital signs in last 24 hours: Temp:  [96.7 F (35.9 C)-97.3 F (36.3 C)] 97.1 F (36.2 C) (11/08 0400) Pulse Rate:  [48-123] 87 (11/08 0400) Resp:  [0-30] 8 (11/08 0400) BP: (74-204)/(49-132) 129/62 (11/08 0400) SpO2:  [85 %-100 %] 100 % (11/08 0407) FiO2 (%):  [35 %] 35 % (11/08 0407) Weight:  [53.7 kg (118 lb 6.2 oz)-54.8 kg (120 lb 13 oz)] 54.8 kg (120 lb 13 oz) (11/08 0500) Weight change: 0 kg (0 lb) Last BM Date: 05/08/16  Intake/Output from previous day: 11/07 0701 - 11/08 0700 In: 34.5 [I.V.:34.5] Out: 0   PHYSICAL EXAM General appearance: alert, mild distress and Intubated on mechanical ventilation. Resp: She has bilateral rhonchi about the same as yesterday Cardio: Her heart is regular with no gallop GI: soft, non-tender; bowel sounds normal; no masses,  no organomegaly Extremities: No edema. She has the inflamed bullous lesions on her hand.  Lab Results:  Results for orders placed or performed during the hospital encounter of 05/08/16 (from the past 48 hour(s))  Glucose, capillary     Status: None   Collection Time: 05/16/16  7:26 AM  Result Value Ref Range   Glucose-Capillary 86 65 - 99 mg/dL  Glucose, capillary     Status: None   Collection Time: 05/16/16 11:18 AM  Result Value Ref Range   Glucose-Capillary 98 65 - 99 mg/dL  Glucose, capillary     Status: None   Collection Time: 05/16/16  4:12 PM  Result Value Ref Range   Glucose-Capillary 88 65 - 99 mg/dL  Glucose, capillary     Status: None   Collection Time: 05/16/16  7:40 PM  Result Value Ref Range   Glucose-Capillary 90 65 - 99 mg/dL   Comment 1 Notify RN   Glucose, capillary      Status: None   Collection Time: 05/17/16 12:00 AM  Result Value Ref Range   Glucose-Capillary 74 65 - 99 mg/dL   Comment 1 Notify RN   Glucose, capillary     Status: None   Collection Time: 05/17/16  4:23 AM  Result Value Ref Range   Glucose-Capillary 80 65 - 99 mg/dL   Comment 1 Notify RN   Basic metabolic panel     Status: Abnormal   Collection Time: 05/17/16  4:37 AM  Result Value Ref Range   Sodium 142 135 - 145 mmol/L   Potassium 4.4 3.5 - 5.1 mmol/L   Chloride 96 (L) 101 - 111 mmol/L   CO2 23 22 - 32 mmol/L   Glucose, Bld 85 65 - 99 mg/dL   BUN 97 (H) 6 - 20 mg/dL   Creatinine, Ser 12.31 (H) 0.44 - 1.00 mg/dL   Calcium 9.4 8.9 - 10.3 mg/dL   GFR calc non Af Amer 3 (L) >60 mL/min   GFR calc Af Amer 3 (L) >60 mL/min    Comment: (NOTE) The eGFR has been calculated using the CKD EPI equation. This calculation has not been validated in all clinical situations. eGFR's persistently <60 mL/min signify possible Chronic Kidney Disease.   CBC     Status: Abnormal   Collection Time: 05/17/16  4:37 AM  Result Value Ref Range   WBC 11.0 (H) 4.0 - 10.5 K/uL   RBC 3.38 (L) 3.87 - 5.11 MIL/uL   Hemoglobin 10.5 (L) 12.0 - 15.0 g/dL   HCT 33.4 (L) 36.0 - 46.0 %   MCV 98.8 78.0 - 100.0 fL   MCH 31.1 26.0 - 34.0 pg   MCHC 31.4 30.0 - 36.0 g/dL   RDW 17.0 (H) 11.5 - 15.5 %   Platelets 355 150 - 400 K/uL  Glucose, capillary     Status: None   Collection Time: 05/17/16  7:25 AM  Result Value Ref Range   Glucose-Capillary 73 65 - 99 mg/dL  Glucose, capillary     Status: Abnormal   Collection Time: 05/17/16 12:22 PM  Result Value Ref Range   Glucose-Capillary 37 (LL) 65 - 99 mg/dL   Comment 1 Notify RN   Glucose, capillary     Status: Abnormal   Collection Time: 05/17/16 12:42 PM  Result Value Ref Range   Glucose-Capillary 35 (LL) 65 - 99 mg/dL   Comment 1 Notify RN   Glucose, capillary     Status: None   Collection Time: 05/17/16 12:43 PM  Result Value Ref Range    Glucose-Capillary 93 65 - 99 mg/dL  Glucose, capillary     Status: Abnormal   Collection Time: 05/17/16  4:07 PM  Result Value Ref Range   Glucose-Capillary 61 (L) 65 - 99 mg/dL  Glucose, capillary     Status: Abnormal   Collection Time: 05/17/16  5:00 PM  Result Value Ref Range   Glucose-Capillary 173 (H) 65 - 99 mg/dL  Glucose, capillary     Status: None   Collection Time: 05/17/16  7:53 PM  Result Value Ref Range   Glucose-Capillary 95 65 - 99 mg/dL   Comment 1 Notify RN   Culture, blood (routine x 2)     Status: None (Preliminary result)   Collection Time: 05/17/16 10:49 PM  Result Value Ref Range   Specimen Description BLOOD PICC    Special Requests BOTTLES DRAWN AEROBIC AND ANAEROBIC 10CC EACH    Culture PENDING    Report Status PENDING   Culture, blood (routine x 2)     Status: None (Preliminary result)   Collection Time: 05/17/16 10:51 PM  Result Value Ref Range   Specimen Description BLOOD PICC    Special Requests BOTTLES DRAWN AEROBIC AND ANAEROBIC 10CC EACH    Culture PENDING    Report Status PENDING   Lactic acid, plasma     Status: None   Collection Time: 05/17/16 10:51 PM  Result Value Ref Range   Lactic Acid, Venous 1.2 0.5 - 1.9 mmol/L  Glucose, capillary     Status: None   Collection Time: 05/17/16 11:59 PM  Result Value Ref Range   Glucose-Capillary 95 65 - 99 mg/dL   Comment 1 Notify RN   Basic metabolic panel     Status: Abnormal   Collection Time: 05/18/16  1:39 AM  Result Value Ref Range   Sodium 137 135 - 145 mmol/L   Potassium 3.6 3.5 - 5.1 mmol/L    Comment: DELTA CHECK NOTED   Chloride 97 (L) 101 - 111 mmol/L   CO2 26 22 - 32 mmol/L   Glucose, Bld 170 (H) 65 - 99 mg/dL   BUN 43 (H) 6 - 20 mg/dL   Creatinine, Ser 7.28 (H) 0.44 - 1.00 mg/dL    Comment: DELTA CHECK NOTED   Calcium 9.0 8.9 - 10.3  mg/dL   GFR calc non Af Amer 5 (L) >60 mL/min   GFR calc Af Amer 6 (L) >60 mL/min    Comment: (NOTE) The eGFR has been calculated using the CKD EPI  equation. This calculation has not been validated in all clinical situations. eGFR's persistently <60 mL/min signify possible Chronic Kidney Disease.    Anion gap 14 5 - 15  CBC     Status: Abnormal   Collection Time: 05/18/16  1:39 AM  Result Value Ref Range   WBC 10.4 4.0 - 10.5 K/uL   RBC 3.04 (L) 3.87 - 5.11 MIL/uL   Hemoglobin 9.4 (L) 12.0 - 15.0 g/dL   HCT 30.2 (L) 36.0 - 46.0 %   MCV 99.3 78.0 - 100.0 fL   MCH 30.9 26.0 - 34.0 pg   MCHC 31.1 30.0 - 36.0 g/dL   RDW 16.7 (H) 11.5 - 15.5 %   Platelets 338 150 - 400 K/uL  Lactic acid, plasma     Status: None   Collection Time: 05/18/16  1:39 AM  Result Value Ref Range   Lactic Acid, Venous 1.1 0.5 - 1.9 mmol/L  Glucose, capillary     Status: Abnormal   Collection Time: 05/18/16  3:55 AM  Result Value Ref Range   Glucose-Capillary 114 (H) 65 - 99 mg/dL   Comment 1 Notify RN   Blood gas, arterial     Status: Abnormal   Collection Time: 05/18/16  4:35 AM  Result Value Ref Range   FIO2 35.00    Delivery systems VENTILATOR    Mode PRESSURE REGULATED VOLUME CONTROL    VT 380 mL   LHR 14 resp/min   Peep/cpap 5.0 cm H20   pH, Arterial 7.430 7.350 - 7.450   pCO2 arterial 41.7 32.0 - 48.0 mmHg   pO2, Arterial 133 (H) 83.0 - 108.0 mmHg   Bicarbonate 27.1 20.0 - 28.0 mmol/L   Acid-Base Excess 3.2 (H) 0.0 - 2.0 mmol/L   O2 Saturation 97.7 %   Patient temperature 37.0    Collection site RIGHT RADIAL    Drawn by [id number]    Sample type ARTERIAL DRAW    Allens test (pass/fail) POSITIVE (A) PASS    ABGS  Recent Labs  05/18/16 0435  PHART 7.430  PO2ART 133*  HCO3 27.1   CULTURES Recent Results (from the past 240 hour(s))  MRSA PCR Screening     Status: None   Collection Time: 05/09/16  2:38 AM  Result Value Ref Range Status   MRSA by PCR NEGATIVE NEGATIVE Final    Comment:        The GeneXpert MRSA Assay (FDA approved for NASAL specimens only), is one component of a comprehensive MRSA colonization surveillance  program. It is not intended to diagnose MRSA infection nor to guide or monitor treatment for MRSA infections.   Culture, blood (routine x 2)     Status: None (Preliminary result)   Collection Time: 05/17/16 10:49 PM  Result Value Ref Range Status   Specimen Description BLOOD PICC  Final   Special Requests BOTTLES DRAWN AEROBIC AND ANAEROBIC 10CC EACH  Final   Culture PENDING  Incomplete   Report Status PENDING  Incomplete  Culture, blood (routine x 2)     Status: None (Preliminary result)   Collection Time: 05/17/16 10:51 PM  Result Value Ref Range Status   Specimen Description BLOOD PICC  Final   Special Requests BOTTLES DRAWN AEROBIC AND ANAEROBIC 10CC EACH  Final   Culture  PENDING  Incomplete   Report Status PENDING  Incomplete   Studies/Results: Dg Hand 2 View Right  Result Date: 05/18/2016 CLINICAL DATA:  Right hand swelling EXAM: RIGHT HAND - 2 VIEW COMPARISON:  None. FINDINGS: There is osteoarthritis at the base of the thumb metacarpal with subchondral sclerosis and cystic change at the base of the first proximal phalanx. Lesser degree of osteoarthritis involving the DIP joints of the second through fifth digits more so involving the index finger. Slight joint space narrowing of the third and fifth MCP. No chondrocalcinosis. There is diffuse soft tissue swelling about the hand more so dorsally. No frank bone destruction is seen. Carpal bones are grossly intact. IMPRESSION: Nonspecific soft tissue swelling of the visualized hand more so dorsally. Osteoarthritis at the base of the thumb metacarpal, DIP joints of the second through fifth digits and with slight joint space narrowing of the third and fifth MCP. Electronically Signed   By: Ashley Royalty M.D.   On: 05/18/2016 03:03   Dg Chest Port 1 View  Result Date: 05/17/2016 CLINICAL DATA:  Hypoxia EXAM: PORTABLE CHEST 1 VIEW COMPARISON:  May 13, 2016 FINDINGS: Endotracheal tube tip is 5.6 cm above the carina. Nasogastric tube tip  and side port are below the diaphragm. Central catheter tip is in the superior vena cava. No pneumothorax. There is no edema or consolidation. The heart size and pulmonary vascularity are normal. No adenopathy. Patient is status post coronary artery bypass grafting. IMPRESSION: Tube and catheter positions as described without pneumothorax. No edema or consolidation. Stable cardiac silhouette. Electronically Signed   By: Lowella Grip III M.D.   On: 05/17/2016 09:43    Medications:  Prior to Admission:  Prescriptions Prior to Admission  Medication Sig Dispense Refill Last Dose  . ALPRAZolam (XANAX) 0.25 MG tablet Take 1-2 tablets by mouth at bedtime as needed for sleep.    05/08/2016 at Unknown time  . amiodarone (PACERONE) 200 MG tablet Take 1 tablet (200 mg total) by mouth daily. 30 tablet 3 05/08/2016 at Unknown time  . aspirin EC 81 MG tablet Take 81 mg by mouth daily.    05/08/2016 at Unknown time  . cloNIDine (CATAPRES) 0.1 MG tablet Take 1 tablet (0.1 mg total) by mouth 2 (two) times daily. 60 tablet 11 05/08/2016 at Unknown time  . epoetin alfa (EPOGEN,PROCRIT) 57262 UNIT/ML injection Inject 1 mL (10,000 Units total) into the vein every Monday, Wednesday, and Friday with hemodialysis. 1 mL 10 05/06/2016 at Unknown time  . fluticasone (FLONASE) 50 MCG/ACT nasal spray Place 2 sprays into the nose daily as needed for allergies.    unknown  . labetalol (NORMODYNE) 200 MG tablet Take 1 tablet (200 mg total) by mouth 2 (two) times daily. 90 tablet 1 05/08/2016 at Unknown time  . lanthanum (FOSRENOL) 1000 MG chewable tablet Chew 1 tablet (1,000 mg total) by mouth 3 (three) times daily with meals. 90 tablet 1 05/08/2016 at Unknown time  . multivitamin (RENA-VIT) TABS tablet Take 1 tablet by mouth daily.   05/08/2016 at Unknown time  . nitroGLYCERIN (NITROSTAT) 0.4 MG SL tablet Place 1 tablet (0.4 mg total) under the tongue every 5 (five) minutes as needed for chest pain. 25 tablet 3 05/08/2016 at  Unknown time  . omeprazole (PRILOSEC) 20 MG capsule TAKE ONE CAPSULE BY MOUTH ONCE DAILY 30 capsule 5 05/08/2016 at Unknown time  . PROAIR HFA 108 (90 BASE) MCG/ACT inhaler Inhale 1 puff into the lungs every 6 (six) hours as needed  for wheezing or shortness of breath.    unknown  . SENSIPAR 30 MG tablet Take 30 mg by mouth daily with supper.    05/08/2016 at Unknown time  . simvastatin (ZOCOR) 20 MG tablet Take 1 tablet (20 mg total) by mouth at bedtime. 90 tablet 3 05/07/2016 at Unknown time   Scheduled: . amiodarone  200 mg Oral Daily  . aspirin EC  81 mg Oral Daily  . chlorhexidine gluconate (MEDLINE KIT)  15 mL Mouth Rinse BID  . famotidine (PEPCID) IV  20 mg Intravenous Q24H  . mouth rinse  15 mL Mouth Rinse QID  . multivitamin  1 tablet Oral Daily  . NIFEdipine  30 mg Oral Daily  . piperacillin-tazobactam (ZOSYN)  IV  3.375 g Intravenous Q12H  . [START ON 05/19/2016] vancomycin  500 mg Intravenous Q T,Th,Sa-HD   Continuous: . heparin 950 Units/hr (05/18/16 0022)   JOA:CZYSAY chloride, sodium chloride, sodium chloride, sodium chloride, acetaminophen, acetaminophen, ALPRAZolam, fentaNYL (SUBLIMAZE) injection, fentaNYL (SUBLIMAZE) injection, fluticasone, ipratropium-albuterol, lidocaine (PF), lidocaine-prilocaine, midazolam, ondansetron (ZOFRAN) IV, pentafluoroprop-tetrafluoroeth  Assesment: She was admitted with hypertensive crisis. She has had difficult to control hypertension at baseline and has had difficulty with becoming hypotensive when she's on dialysis. She had CODE BLUE 2. As a result of that she has been intubated and placed on mechanical ventilation. We have been unable to wean her from the ventilator because of apnea.  She had elevated troponin thought to be demand ischemia.  She is known to have coronary disease but it does not look like she had acute coronary syndrome  She has end-stage renal disease on hemodialysis. She dialyzed yesterday  She has chronic diastolic  heart failure which seems to be pretty well controlled at this point.  She may have some neurologic abnormality from her 2 episodes of CODE BLUE.  She has complicated situation with multi-system failure requiring high complexity decision making Principal Problem:   Hypertensive crisis Active Problems:   Coronary atherosclerosis of native coronary artery   ESRD on hemodialysis (HCC)   Anemia of chronic kidney failure   Chronic diastolic CHF (congestive heart failure) (HCC)   Chest pain   Acute respiratory failure (HCC)   Hypertensive crisis without congestive heart failure   Cardiac arrest [facility])   Palliative care encounter   DNR (do not resuscitate) discussion   Goals of care, counseling/discussion    Plan: Attempt weaning again today. If she's not able to wean today we need to start thinking about tracheostomy if her husband still wants full measures.    LOS: 9 days   , L 05/18/2016, 7:23 AM

## 2016-05-18 NOTE — Progress Notes (Signed)
Attempted wean this am on CP/PSV of 5/5. The patient was alert and went completely apneic. I tried to coach and stimulate the patient to no avail. The patient was then placed back in full support mode and RT will continue to monitor throughout the day.

## 2016-05-18 NOTE — Progress Notes (Signed)
PROGRESS NOTE    Shawna [name]  WUJ:[id number] DOB: 1941/03/07 DOA: 05/08/2016 PCP: [name], MD    Brief Narrative:  75 yof with a history of CAD and ESRD, presented with acute onset of chest pain unrelieved by aspirin and nitroglycerin. In the emergency department BP 230/130 and she was noted to have new ST depression. She was started on nitroglycerin infusion and IV labetalol and admitted for hypertensive crisis with chest pain.Her chest pain had resolved but she remained hypertensive. On 11/1 patient suddenly became bradycardic and unresponsive. She underwent CPR, chest compressions, received epinephrine and had ROSC. On 11/2 prior to starting her dialysis she was noted to be hypertensive so she was given IV labetalol and developed bradycardia with subsequent PEA arrest. She underwent CPR again for <35mns and had ROSC. She currently remains on ventilator. Weaning has been difficult due to periods of apnea.   Assessment & Plan:   Principal Problem:   Hypertensive crisis Active Problems:   Coronary atherosclerosis of native coronary artery   ESRD on hemodialysis (HCC)   Anemia of chronic kidney failure   Chronic diastolic CHF (congestive heart failure) (HCC)   Chest pain   Acute respiratory failure ([facility]   Hypertensive crisis without congestive heart failure   Cardiac arrest ([facility]   Palliative care encounter   DNR (do not resuscitate) discussion   Goals of care, counseling/discussion  1. Bradycardia with PEA cardiac arrest. On 11/1, patient's heart rate became bradycardic into PEA. She underwent CPR, received epinephrine, bicarbonate and calcium chloride. She had ROSC after approximately 25 mins of CPR. Lab work had indicated an elevated potassium of 6.4, which may have been cause. She was brieflystarted on a epinephrine infusion which was quicklyweaned off. CXR did not show any acute process. Cardiology following. On 11/2, prior to starting dialysis, patient was noted to be  hypertensive, despite being on nitroglycerin infusion. She received one dose of IV labetalol and subsequently developed bradycardia and went into PEA arrest. She received epinephrine, bicarbonate, and calcium chloride, as well as CPR for <567ms and had ROSC. Currently, heart rate has been stable. Cardiology following.  2. Acute respiratory failure. Ventilator dependent. Patient intubated while undergoing CPR. Pulmonology following. She is undergoing daily weaning trials. She remains hemodynamically stable. Weaning has been unsuccessful due to intermittent periods of apnea. Neurology consulted. 3. Hypertensive crisis with chest pain. Patient was admitted with a blood pressure of 230/130 and chest pain. She was started on nitroglycerin and IV labetalol which have since been weanedoff. Her chest pain had resolved. Blood pressures are currently stable. She becomes symptomatic from hypotension with sbp in the low 100s and passes out. 4. Elevated troponin. Patient has a history of CAD status post CABG and stenting. Patient had no evidence of ACS on admission. Since that time, her troponin has started to rise. Cardiology following and felt that this may be demand ischemia/related to CPR. Troponin peak was 1.5 and has since trended down since.  5. ESRD on HD MWF. Nephrology is following for dialysis needs.  6. Chronic diastolic congestive heart failure. Patient has an echocardiogram on 09/2015 with an EF of 6095-62%grade 2 diastolic dysfunction. Continue fluid management with dialysis. 7. PSVT, maintained on amiodarone. EKG remains in sinus rhythm.  8. Myoclonus. Suspect secondary to CKD/ESRD on HD. No history or objective findings to suggest acute CNS process. Reviewed meds, no likely offenders. Monitor for now 9. Hyperkalemia. Resolved.  10. Elevated TSH. Recheck as an outpatient. 11. Anemia of chronic kidney  disease. Hemoglobin currently stable. Will continue to monitor. 12. Right hand swelling. Started on  IV antibiotics due to concern of cellulitis. Xray of hand does not show any gas. Venous dopplers ordered to rule out DVT. Also started on IV heparin until DVT ruled out.  DVT prophylaxis: Heparin infusion Code Status: FULL  Family Communication: husband at bedside Disposition Plan: pending clinical improvement    Consultants:   Nephrology   Cardiology  Pulmonology  Nutrition   Neurology  Procedures:   Intubation 11/1>>  Antimicrobials  Vancomycin 11/7>>  Zosyn 11/7>>   Subjective: Intubated and sedated   Objective: Vitals:   05/18/16 0600 05/18/16 0730 05/18/16 0809 05/18/16 0900  BP: 92/80   119/72  Pulse: 93   93  Resp: 17     Temp:  97.8 F (36.6 C)    TempSrc:  Axillary    SpO2: 99%  100% 100%  Weight:      Height:        Intake/Output Summary (Last 24 hours) at 05/18/16 1125 Last data filed at 05/18/16 0400  Gross per 24 hour  Intake            34.52 ml  Output                0 ml  Net            34.52 ml   Filed Weights   05/17/16 0500 05/17/16 0830 05/18/16 0500  Weight: 53.7 kg (118 lb 6.2 oz) 53.7 kg (118 lb 6.2 oz) 54.8 kg (120 lb 13 oz)    Examination:  General exam: Appears calm and comfortable  Respiratory system: Clear to auscultation. Respiratory effort normal. Cardiovascular system: S1 & S2 heard, RRR. No JVD, murmurs, rubs, gallops or clicks. No pedal edema. Gastrointestinal system: Abdomen is nondistended, soft and nontender. No organomegaly or masses felt. Normal bowel sounds heard. Central nervous system: No focal neurological deficits. Extremities: right hand is edematous. Pulses are palpable. Erythema and warmth noted over hand. Skin: No rashes, lesions or ulcers Psychiatry: cannot assess     Data Reviewed: I have personally reviewed following labs and imaging studies  CBC:  Recent Labs Lab 05/14/16 0408 05/15/16 0445 05/16/16 0435 05/17/16 0437 05/18/16 0139  WBC 9.6 9.5 11.4* 11.0* 10.4  HGB 8.9* 9.9* 10.4*  10.5* 9.4*  HCT 28.2* 31.7* 33.5* 33.4* 30.2*  MCV 98.9 100.0 99.1 98.8 99.3  PLT 230 285 304 355 580   Basic Metabolic Panel:  Recent Labs Lab 05/11/16 1410  05/13/16 0430 05/14/16 0408 05/15/16 0445 05/16/16 0435 05/17/16 0437 05/18/16 0139  NA 137  < > 138 140 138 140 142 137  K 4.0  < > 4.4 4.4 3.8 3.9 4.4 3.6  CL 96*  < > 96* 96* 93* 93* 96* 97*  CO2 27  < > '27 26 28 26 23 26  '$ GLUCOSE 100*  < > 127* 108* 92 85 85 170*  BUN 19  < > 47* 70* 40* 70* 97* 43*  CREATININE 4.08*  < > 6.74* 9.58* 6.62* 9.57* 12.31* 7.28*  CALCIUM 9.2  < > 8.4* 8.6* 9.3 9.2 9.4 9.0  PHOS 4.2  --  6.1* 7.3*  --   --   --   --   < > = values in this interval not displayed. GFR: Estimated Creatinine Clearance: 5 mL/min (by C-G formula based on SCr of 7.28 mg/dL (H)). Liver Function Tests:  Recent Labs Lab 05/11/16 1410 05/12/16 0359 05/13/16 0430  05/14/16 0408  AST  --  421*  --   --   ALT  --  529*  --   --   ALKPHOS  --  92  --   --   BILITOT  --  1.1  --   --   PROT  --  6.5  --   --   ALBUMIN 3.6 3.4* 3.1* 3.0*   No results for input(s): LIPASE, AMYLASE in the last 168 hours. No results for input(s): AMMONIA in the last 168 hours. Coagulation Profile: No results for input(s): INR, PROTIME in the last 168 hours. Cardiac Enzymes:  Recent Labs Lab 05/11/16 1410 05/11/16 2010 05/12/16 0948  TROPONINI 1.27* 1.53* 0.66*   BNP (last 3 results) No results for input(s): PROBNP in the last 8760 hours. HbA1C: No results for input(s): HGBA1C in the last 72 hours. CBG:  Recent Labs Lab 05/17/16 1953 05/17/16 2359 05/18/16 0355 05/18/16 0731 05/18/16 1119  GLUCAP 95 95 114* 101* 110*   Lipid Profile: No results for input(s): CHOL, HDL, LDLCALC, TRIG, CHOLHDL, LDLDIRECT in the last 72 hours. Thyroid Function Tests: No results for input(s): TSH, T4TOTAL, FREET4, T3FREE, THYROIDAB in the last 72 hours. Anemia Panel: No results for input(s): VITAMINB12, FOLATE, FERRITIN, TIBC,  IRON, RETICCTPCT in the last 72 hours. Sepsis Labs:  Recent Labs Lab 05/17/16 2251 05/18/16 0139  LATICACIDVEN 1.2 1.1    Recent Results (from the past 240 hour(s))  MRSA PCR Screening     Status: None   Collection Time: 05/09/16  2:38 AM  Result Value Ref Range Status   MRSA by PCR NEGATIVE NEGATIVE Final    Comment:        The GeneXpert MRSA Assay (FDA approved for NASAL specimens only), is one component of a comprehensive MRSA colonization surveillance program. It is not intended to diagnose MRSA infection nor to guide or monitor treatment for MRSA infections.   Culture, blood (routine x 2)     Status: None (Preliminary result)   Collection Time: 05/17/16 10:49 PM  Result Value Ref Range Status   Specimen Description BLOOD PICC  Final   Special Requests BOTTLES DRAWN AEROBIC AND ANAEROBIC 10CC EACH  Final   Culture NO GROWTH < 12 HOURS  Final   Report Status PENDING  Incomplete  Culture, blood (routine x 2)     Status: None (Preliminary result)   Collection Time: 05/17/16 10:51 PM  Result Value Ref Range Status   Specimen Description BLOOD PICC  Final   Special Requests BOTTLES DRAWN AEROBIC AND ANAEROBIC 10CC EACH  Final   Culture NO GROWTH < 12 HOURS  Final   Report Status PENDING  Incomplete         Radiology Studies: Dg Hand 2 View Right  Result Date: 05/18/2016 CLINICAL DATA:  Right hand swelling EXAM: RIGHT HAND - 2 VIEW COMPARISON:  None. FINDINGS: There is osteoarthritis at the base of the thumb metacarpal with subchondral sclerosis and cystic change at the base of the first proximal phalanx. Lesser degree of osteoarthritis involving the DIP joints of the second through fifth digits more so involving the index finger. Slight joint space narrowing of the third and fifth MCP. No chondrocalcinosis. There is diffuse soft tissue swelling about the hand more so dorsally. No frank bone destruction is seen. Carpal bones are grossly intact. IMPRESSION: Nonspecific  soft tissue swelling of the visualized hand more so dorsally. Osteoarthritis at the base of the thumb metacarpal, DIP joints of the second through  fifth digits and with slight joint space narrowing of the third and fifth MCP. Electronically Signed   By: Ashley Royalty M.D.   On: 05/18/2016 03:03   Dg Chest Port 1 View  Result Date: 05/18/2016 CLINICAL DATA:  75 year old female currently intubated with respiratory failure EXAM: PORTABLE CHEST 1 VIEW COMPARISON:  Prior chest x-ray 05/17/2016 FINDINGS: The tip the endotracheal tube is 4 cm above the carina. A right IJ tunneled PICC is present. The tip of the catheter overlies the distal SVC. A gastric tube is also present. The tip lies below the diaphragm and field-of-view, presumably within the stomach. Patient is status post median sternotomy with evidence of prior multivessel CABG. The cardiac silhouette and mediastinal contours remain unchanged. Atherosclerotic calcifications again noted in the transverse aorta. No significant pulmonary edema, pleural effusion, pneumothorax or focal airspace consolidation. There is likely mild bibasilar subsegmental atelectasis. No acute osseous abnormality. IMPRESSION: 1. Stable and satisfactory support apparatus. 2. No significant interval change in the appearance of the chest. Persistent mild bibasilar atelectasis. Electronically Signed   By: Jacqulynn Cadet M.D.   On: 05/18/2016 09:05   Dg Chest Port 1 View  Result Date: 05/17/2016 CLINICAL DATA:  Hypoxia EXAM: PORTABLE CHEST 1 VIEW COMPARISON:  May 13, 2016 FINDINGS: Endotracheal tube tip is 5.6 cm above the carina. Nasogastric tube tip and side port are below the diaphragm. Central catheter tip is in the superior vena cava. No pneumothorax. There is no edema or consolidation. The heart size and pulmonary vascularity are normal. No adenopathy. Patient is status post coronary artery bypass grafting. IMPRESSION: Tube and catheter positions as described without  pneumothorax. No edema or consolidation. Stable cardiac silhouette. Electronically Signed   By: Lowella Grip III M.D.   On: 05/17/2016 09:43        Scheduled Meds: . amiodarone  200 mg Oral Daily  . aspirin EC  81 mg Oral Daily  . chlorhexidine gluconate (MEDLINE KIT)  15 mL Mouth Rinse BID  . famotidine (PEPCID) IV  20 mg Intravenous Q24H  . mouth rinse  15 mL Mouth Rinse QID  . multivitamin  1 tablet Oral Daily  . NIFEdipine  30 mg Oral Daily  . piperacillin-tazobactam (ZOSYN)  IV  3.375 g Intravenous Q12H  . polyethylene glycol  17 g Per Tube Daily  . [START ON 05/19/2016] vancomycin  500 mg Intravenous Q T,Th,Sa-HD   Continuous Infusions: . feeding supplement (VITAL AF 1.2 CAL)    . heparin 800 Units/hr (05/18/16 0900)     LOS: 9 days    Time spent: 25 minutes     Kathie Dike, MD [facility] If 7PM-7AM, please contact night-coverage [email or web address] Password [email or web address] 05/18/2016, 11:25 AM

## 2016-05-19 ENCOUNTER — Inpatient Hospital Stay (HOSPITAL_COMMUNITY): Payer: Medicare HMO

## 2016-05-19 DIAGNOSIS — G729 Myopathy, unspecified: Secondary | ICD-10-CM | POA: Diagnosis not present

## 2016-05-19 DIAGNOSIS — G9340 Encephalopathy, unspecified: Secondary | ICD-10-CM | POA: Diagnosis not present

## 2016-05-19 LAB — COMPREHENSIVE METABOLIC PANEL
ALT: 178 U/L — ABNORMAL HIGH (ref 14–54)
ANION GAP: 17 — AB (ref 5–15)
AST: 36 U/L (ref 15–41)
Albumin: 2.8 g/dL — ABNORMAL LOW (ref 3.5–5.0)
Alkaline Phosphatase: 75 U/L (ref 38–126)
BUN: 70 mg/dL — ABNORMAL HIGH (ref 6–20)
CHLORIDE: 95 mmol/L — AB (ref 101–111)
CO2: 25 mmol/L (ref 22–32)
Calcium: 8.9 mg/dL (ref 8.9–10.3)
Creatinine, Ser: 9.94 mg/dL — ABNORMAL HIGH (ref 0.44–1.00)
GFR, EST AFRICAN AMERICAN: 4 mL/min — AB (ref >60)
GFR, EST NON AFRICAN AMERICAN: 3 mL/min — AB (ref >60)
Glucose, Bld: 181 mg/dL — ABNORMAL HIGH (ref 65–99)
POTASSIUM: 4.1 mmol/L (ref 3.5–5.1)
SODIUM: 137 mmol/L (ref 135–145)
Total Bilirubin: 0.9 mg/dL (ref 0.3–1.2)
Total Protein: 6.4 g/dL — ABNORMAL LOW (ref 6.5–8.1)

## 2016-05-19 LAB — CK TOTAL AND CKMB (NOT AT ARMC)
CK TOTAL: 33 U/L — AB (ref 38–234)
CK, MB: 1.5 ng/mL (ref 0.5–5.0)
RELATIVE INDEX: INVALID (ref 0.0–2.5)

## 2016-05-19 LAB — BLOOD GAS, ARTERIAL
ACID-BASE DEFICIT: 1.1 mmol/L (ref 0.0–2.0)
Allens test (pass/fail): POSITIVE — AB
Bicarbonate: 23 mmol/L (ref 20.0–28.0)
DRAWN BY: 382351
FIO2: 30
LHR: 14 {breaths}/min
O2 SAT: 94.6 %
PEEP/CPAP: 5 cmH2O
PH ART: 7.329 — AB (ref 7.350–7.450)
Patient temperature: 37
VT: 380 mL
pCO2 arterial: 46.9 mmHg (ref 32.0–48.0)
pO2, Arterial: 97.3 mmHg (ref 83.0–108.0)

## 2016-05-19 LAB — RENAL FUNCTION PANEL
ALBUMIN: 2.8 g/dL — AB (ref 3.5–5.0)
ANION GAP: 16 — AB (ref 5–15)
BUN: 71 mg/dL — ABNORMAL HIGH (ref 6–20)
CALCIUM: 9 mg/dL (ref 8.9–10.3)
CO2: 26 mmol/L (ref 22–32)
Chloride: 96 mmol/L — ABNORMAL LOW (ref 101–111)
Creatinine, Ser: 10.2 mg/dL — ABNORMAL HIGH (ref 0.44–1.00)
GFR calc Af Amer: 4 mL/min — ABNORMAL LOW (ref >60)
GFR, EST NON AFRICAN AMERICAN: 3 mL/min — AB (ref >60)
Glucose, Bld: 181 mg/dL — ABNORMAL HIGH (ref 65–99)
PHOSPHORUS: 8 mg/dL — AB (ref 2.5–4.6)
POTASSIUM: 4.1 mmol/L (ref 3.5–5.1)
SODIUM: 138 mmol/L (ref 135–145)

## 2016-05-19 LAB — GLUCOSE, CAPILLARY
GLUCOSE-CAPILLARY: 112 mg/dL — AB (ref 65–99)
GLUCOSE-CAPILLARY: 122 mg/dL — AB (ref 65–99)
GLUCOSE-CAPILLARY: 134 mg/dL — AB (ref 65–99)
GLUCOSE-CAPILLARY: 170 mg/dL — AB (ref 65–99)
Glucose-Capillary: 122 mg/dL — ABNORMAL HIGH (ref 65–99)

## 2016-05-19 LAB — CBC
HEMATOCRIT: 29.9 % — AB (ref 36.0–46.0)
HEMOGLOBIN: 9.5 g/dL — AB (ref 12.0–15.0)
MCH: 31.4 pg (ref 26.0–34.0)
MCHC: 31.8 g/dL (ref 30.0–36.0)
MCV: 98.7 fL (ref 78.0–100.0)
Platelets: 404 10*3/uL — ABNORMAL HIGH (ref 150–400)
RBC: 3.03 MIL/uL — ABNORMAL LOW (ref 3.87–5.11)
RDW: 16.8 % — AB (ref 11.5–15.5)
WBC: 19 10*3/uL — ABNORMAL HIGH (ref 4.0–10.5)

## 2016-05-19 LAB — C-REACTIVE PROTEIN: CRP: 14.2 mg/dL — ABNORMAL HIGH (ref <1.0)

## 2016-05-19 LAB — HEPARIN LEVEL (UNFRACTIONATED): HEPARIN UNFRACTIONATED: 0.48 [IU]/mL (ref 0.30–0.70)

## 2016-05-19 LAB — SEDIMENTATION RATE: SED RATE: 99 mm/h — AB (ref 0–22)

## 2016-05-19 LAB — VITAMIN B12: Vitamin B-12: 1079 pg/mL — ABNORMAL HIGH (ref 180–914)

## 2016-05-19 LAB — TSH: TSH: 1.536 u[IU]/mL (ref 0.350–4.500)

## 2016-05-19 IMAGING — CT CT HEAD W/O CM
3 series · 15 of 43 positions shown, 18 images · non-contrast
Comparison: [DATE]

CLINICAL DATA: Altered mental status. Hypertensive crisis. Cardiac
arrest. Unable to wean from ventilator.

EXAM:
CT HEAD WITHOUT CONTRAST
TECHNIQUE: Contiguous axial images were obtained from the base of the skull
through the vertex without intravenous contrast.

[Series 2: head wo · axial · 0.39mm/px · z∈[+1753,+1858]mm · 9 of 26 slices shown, 12 images]
[im 3/26  brain]
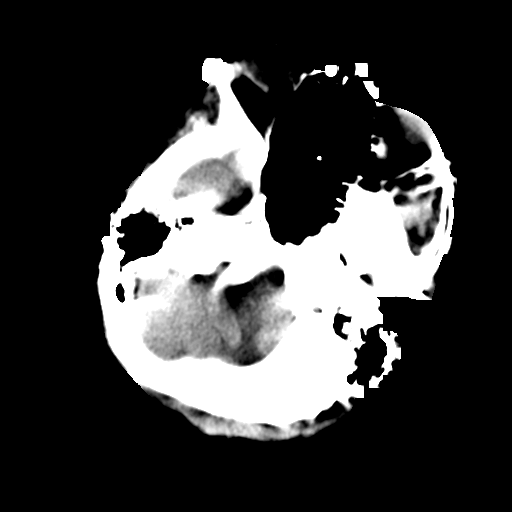
[im 3/26  bone]
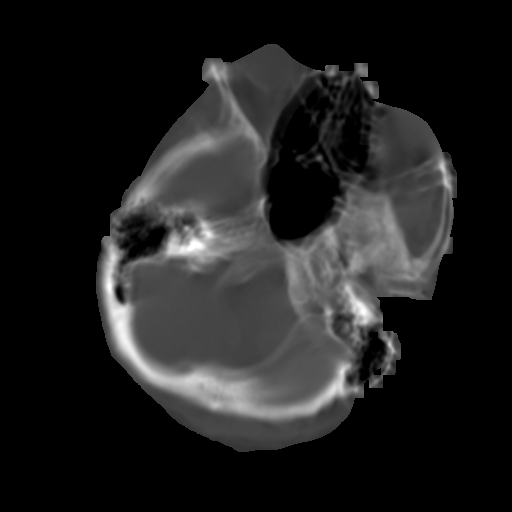
[im 6/26  brain]
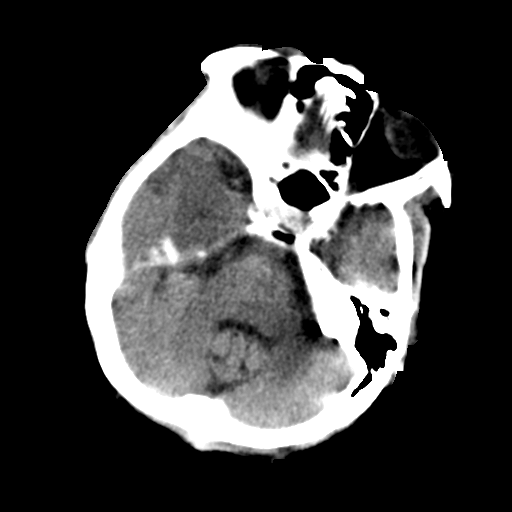
[im 8/26  brain]
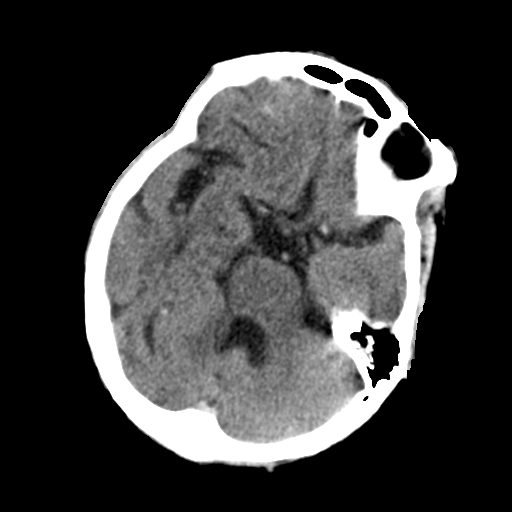
[im 11/26  brain]
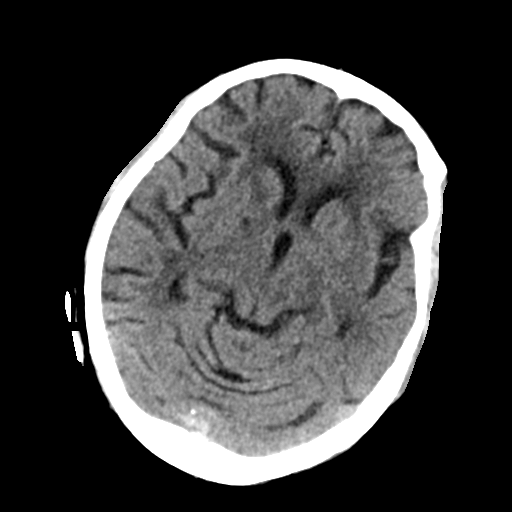
[im 14/26  brain]
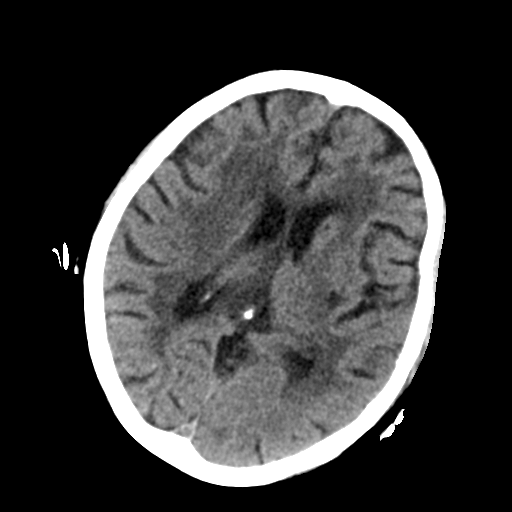
[im 14/26  bone]
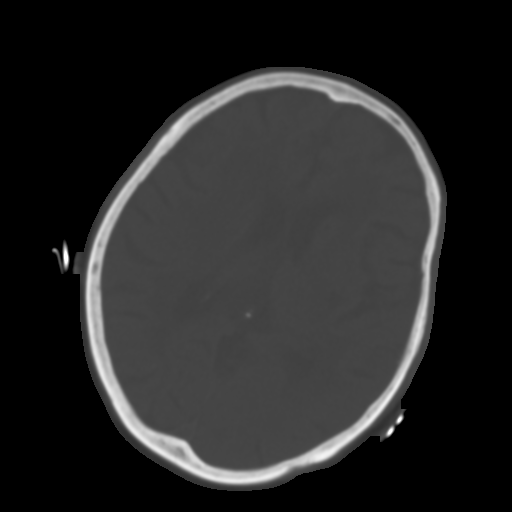
[im 16/26  brain]
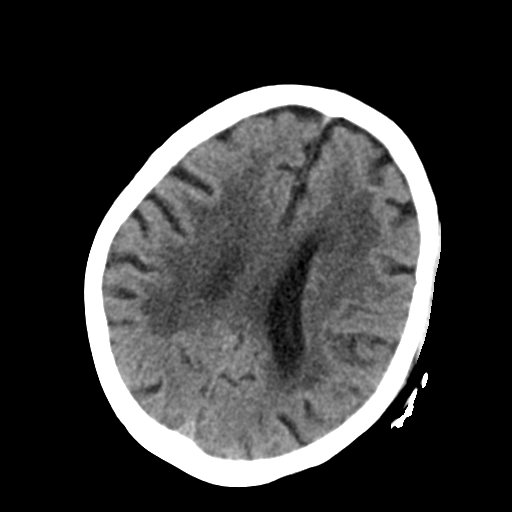
[im 19/26  brain]
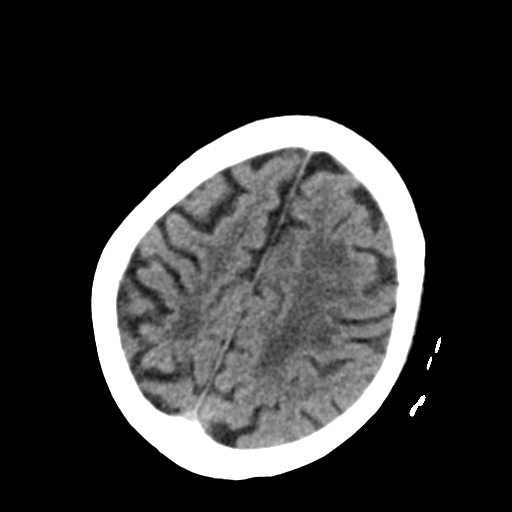
[im 22/26  brain]
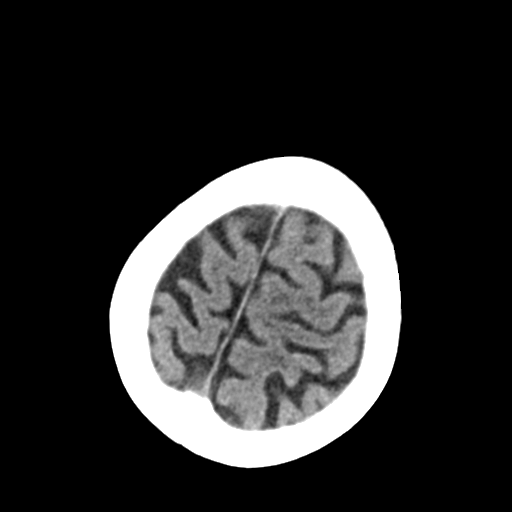
[im 24/26  brain]
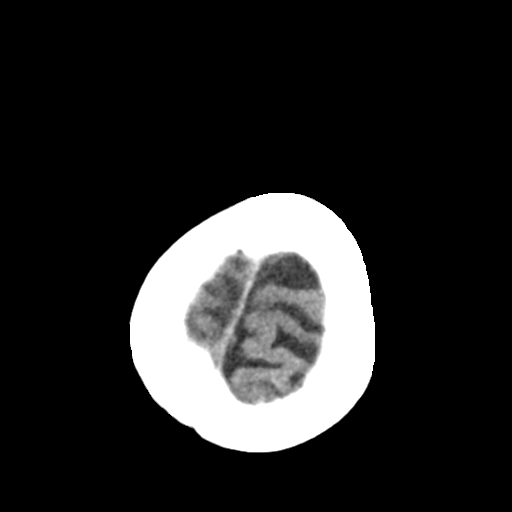
[im 24/26  bone]
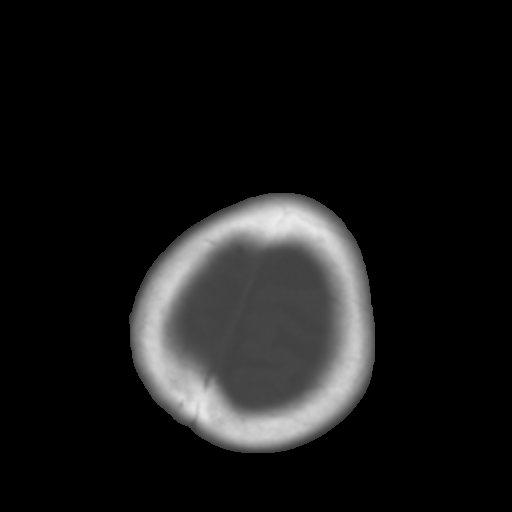

[Series 4: coronal soft tissue · coronal · 0.25mm/px · 3 of 57 slices shown]
[im 19/57  brain]
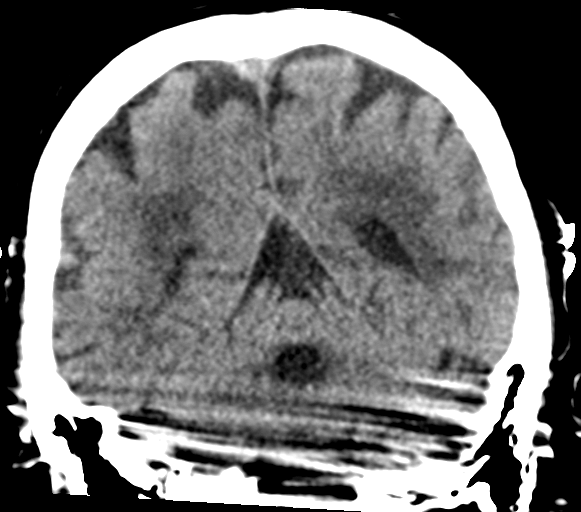
[im 25/57  brain]
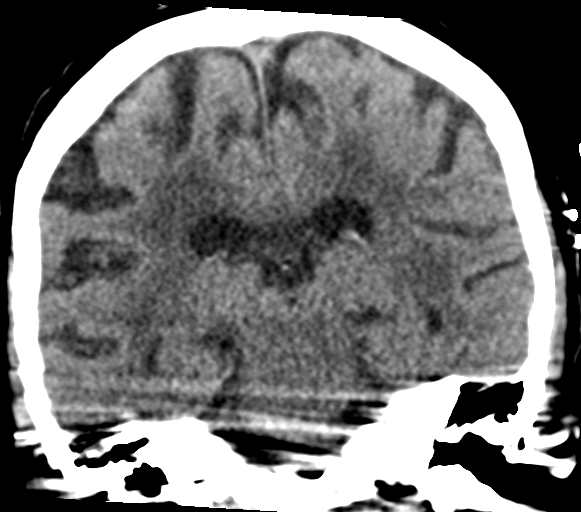
[im 32/57  brain]
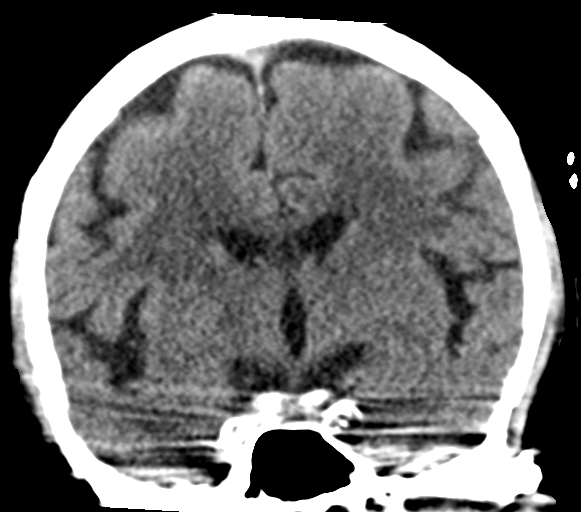

[Series 5: sagittal soft tissue · sagittal · 0.25mm/px · 3 of 42 slices shown]
[im 14/42  brain]
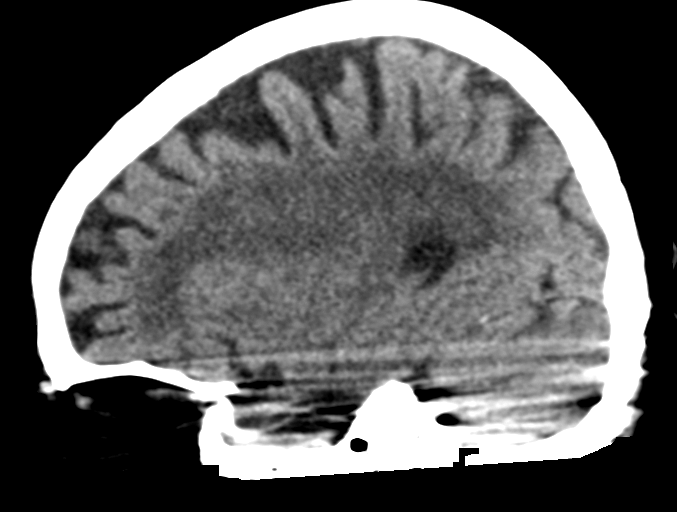
[im 21/42  brain]
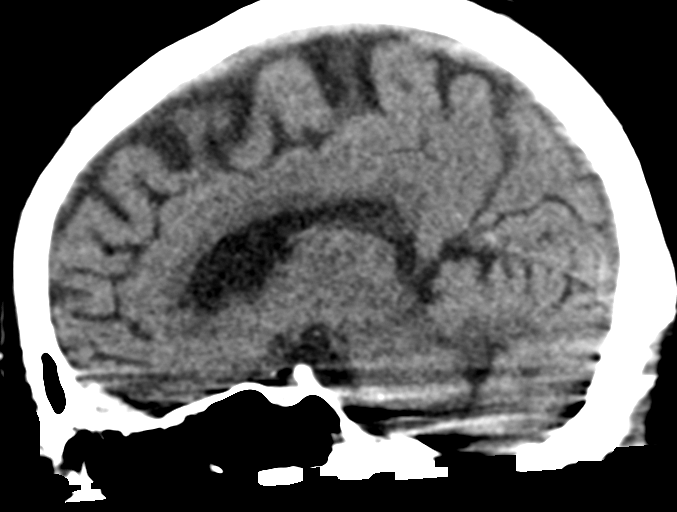
[im 28/42  brain]
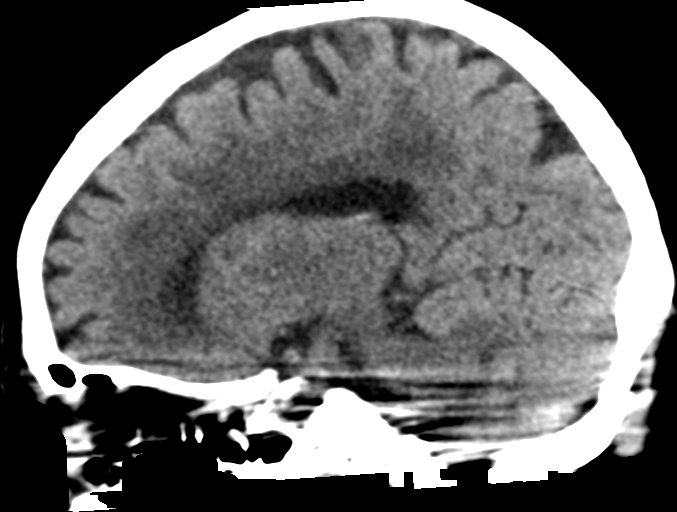

[15 of 43 positions shown; findings below may reference images not displayed]

FINDINGS: Motion artifact at the skullbase.

Brain: There is no evidence of acute cortical infarct, intracranial
hemorrhage, mass, midline shift, or extra-axial fluid collection.
Mild cerebral atrophy is unchanged. Confluent cerebral white matter
hypodensities are similar to the prior study and compatible with
extensive chronic small vessel ischemic disease. There are chronic
lacunar infarcts in the basal ganglia.

Vascular: Calcified atherosclerosis at the skullbase. No hyperdense
vessel.

Skull: No fracture or focal osseous lesion.

Sinuses/Orbits: Visualized paranasal sinuses and mastoid air cells
are clear. Visualized orbits are grossly unremarkable.

Other: None.
IMPRESSION: 1. No evidence of acute intracranial abnormality.
2. Extensive chronic small vessel ischemic disease.

## 2016-05-19 MED ORDER — ALBUMIN HUMAN 25 % IV SOLN
25.0000 g | Freq: Once | INTRAVENOUS | Status: AC
  Administered 2016-05-19: 25 g via INTRAVENOUS
  Filled 2016-05-19: qty 100

## 2016-05-19 NOTE — Progress Notes (Signed)
Subjective: No new complaints are noted. She had a large BM last night. Blood pressure seems to have stabilized somewhat since then. She does arouse but does not follow any commands does not track with her eyes.  Objective: Vital signs in last 24 hours: Temp:  [97.5 F (36.4 C)-98.8 F (37.1 C)] 97.5 F (36.4 C) (11/09 0400) Pulse Rate:  [87-96] 91 (11/09 0600) Resp:  [0-25] 10 (11/09 0600) BP: (75-130)/(16-82) 91/52 (11/09 0600) SpO2:  [98 %-100 %] 100 % (11/09 0600) FiO2 (%):  [30 %-35 %] 30 % (11/09 0459) Weight:  [55.5 kg (122 lb 5.7 oz)] 55.5 kg (122 lb 5.7 oz) (11/09 0500) Weight change: 1.8 kg (3 lb 15.5 oz) Last BM Date: 05/08/16  Intake/Output from previous day: 11/08 0701 - 11/09 0700 In: 1112 [I.V.:199.5; NG/GT:762.5; IV Piggyback:150] Out: 200 [Emesis/NG output:200]  PHYSICAL EXAM General appearance: Intubated sedated on mechanical ventilation but arousable Resp: rhonchi bilaterally Cardio: regular rate and rhythm, S1, S2 normal, no murmur, click, rub or gallop GI: soft, non-tender; bowel sounds normal; no masses,  no organomegaly Extremities: extremities normal, atraumatic, no cyanosis or edema Skin warm and dry  Lab Results:  Results for orders placed or performed during the hospital encounter of 05/08/16 (from the past 48 hour(s))  Glucose, capillary     Status: None   Collection Time: 05/17/16  7:25 AM  Result Value Ref Range   Glucose-Capillary 73 65 - 99 mg/dL  Glucose, capillary     Status: Abnormal   Collection Time: 05/17/16 12:22 PM  Result Value Ref Range   Glucose-Capillary 37 (LL) 65 - 99 mg/dL   Comment 1 Notify RN   Glucose, capillary     Status: Abnormal   Collection Time: 05/17/16 12:42 PM  Result Value Ref Range   Glucose-Capillary 35 (LL) 65 - 99 mg/dL   Comment 1 Notify RN   Glucose, capillary     Status: None   Collection Time: 05/17/16 12:43 PM  Result Value Ref Range   Glucose-Capillary 93 65 - 99 mg/dL  Glucose, capillary      Status: Abnormal   Collection Time: 05/17/16  4:07 PM  Result Value Ref Range   Glucose-Capillary 61 (L) 65 - 99 mg/dL  Glucose, capillary     Status: Abnormal   Collection Time: 05/17/16  5:00 PM  Result Value Ref Range   Glucose-Capillary 173 (H) 65 - 99 mg/dL  Glucose, capillary     Status: None   Collection Time: 05/17/16  7:53 PM  Result Value Ref Range   Glucose-Capillary 95 65 - 99 mg/dL   Comment 1 Notify RN   Culture, blood (routine x 2)     Status: None (Preliminary result)   Collection Time: 05/17/16 10:49 PM  Result Value Ref Range   Specimen Description BLOOD PICC    Special Requests BOTTLES DRAWN AEROBIC AND ANAEROBIC 10CC EACH    Culture NO GROWTH < 12 HOURS    Report Status PENDING   Culture, blood (routine x 2)     Status: None (Preliminary result)   Collection Time: 05/17/16 10:51 PM  Result Value Ref Range   Specimen Description BLOOD PICC    Special Requests BOTTLES DRAWN AEROBIC AND ANAEROBIC 10CC EACH    Culture NO GROWTH < 12 HOURS    Report Status PENDING   Lactic acid, plasma     Status: None   Collection Time: 05/17/16 10:51 PM  Result Value Ref Range   Lactic Acid, Venous 1.2 0.5 -  1.9 mmol/L  Glucose, capillary     Status: None   Collection Time: 05/17/16 11:59 PM  Result Value Ref Range   Glucose-Capillary 95 65 - 99 mg/dL   Comment 1 Notify RN   Basic metabolic panel     Status: Abnormal   Collection Time: 05/18/16  1:39 AM  Result Value Ref Range   Sodium 137 135 - 145 mmol/L   Potassium 3.6 3.5 - 5.1 mmol/L    Comment: DELTA CHECK NOTED   Chloride 97 (L) 101 - 111 mmol/L   CO2 26 22 - 32 mmol/L   Glucose, Bld 170 (H) 65 - 99 mg/dL   BUN 43 (H) 6 - 20 mg/dL   Creatinine, Ser 7.28 (H) 0.44 - 1.00 mg/dL    Comment: DELTA CHECK NOTED   Calcium 9.0 8.9 - 10.3 mg/dL   GFR calc non Af Amer 5 (L) >60 mL/min   GFR calc Af Amer 6 (L) >60 mL/min    Comment: (NOTE) The eGFR has been calculated using the CKD EPI equation. This calculation has  not been validated in all clinical situations. eGFR's persistently <60 mL/min signify possible Chronic Kidney Disease.    Anion gap 14 5 - 15  CBC     Status: Abnormal   Collection Time: 05/18/16  1:39 AM  Result Value Ref Range   WBC 10.4 4.0 - 10.5 K/uL   RBC 3.04 (L) 3.87 - 5.11 MIL/uL   Hemoglobin 9.4 (L) 12.0 - 15.0 g/dL   HCT 30.2 (L) 36.0 - 46.0 %   MCV 99.3 78.0 - 100.0 fL   MCH 30.9 26.0 - 34.0 pg   MCHC 31.1 30.0 - 36.0 g/dL   RDW 16.7 (H) 11.5 - 15.5 %   Platelets 338 150 - 400 K/uL  Lactic acid, plasma     Status: None   Collection Time: 05/18/16  1:39 AM  Result Value Ref Range   Lactic Acid, Venous 1.1 0.5 - 1.9 mmol/L  Glucose, capillary     Status: Abnormal   Collection Time: 05/18/16  3:55 AM  Result Value Ref Range   Glucose-Capillary 114 (H) 65 - 99 mg/dL   Comment 1 Notify RN   Blood gas, arterial     Status: Abnormal   Collection Time: 05/18/16  4:35 AM  Result Value Ref Range   FIO2 35.00    Delivery systems VENTILATOR    Mode PRESSURE REGULATED VOLUME CONTROL    VT 380 mL   LHR 14 resp/min   Peep/cpap 5.0 cm H20   pH, Arterial 7.430 7.350 - 7.450   pCO2 arterial 41.7 32.0 - 48.0 mmHg   pO2, Arterial 133 (H) 83.0 - 108.0 mmHg   Bicarbonate 27.1 20.0 - 28.0 mmol/L   Acid-Base Excess 3.2 (H) 0.0 - 2.0 mmol/L   O2 Saturation 97.7 %   Patient temperature 37.0    Collection site RIGHT RADIAL    Drawn by [id number]    Sample type ARTERIAL DRAW    Allens test (pass/fail) POSITIVE (A) PASS  Heparin level (unfractionated)     Status: Abnormal   Collection Time: 05/18/16  7:16 AM  Result Value Ref Range   Heparin Unfractionated 0.94 (H) 0.30 - 0.70 IU/mL    Comment:        IF HEPARIN RESULTS ARE BELOW EXPECTED VALUES, AND PATIENT DOSAGE HAS BEEN CONFIRMED, SUGGEST FOLLOW UP TESTING OF ANTITHROMBIN III LEVELS.   Glucose, capillary     Status: Abnormal   Collection Time:  05/18/16  7:31 AM  Result Value Ref Range   Glucose-Capillary 101 (H) 65 - 99  mg/dL   Comment 1 Notify RN    Comment 2 Document in Chart   Glucose, capillary     Status: Abnormal   Collection Time: 05/18/16 11:19 AM  Result Value Ref Range   Glucose-Capillary 110 (H) 65 - 99 mg/dL   Comment 1 Notify RN    Comment 2 Document in Chart   Heparin level (unfractionated)     Status: None   Collection Time: 05/18/16  1:54 PM  Result Value Ref Range   Heparin Unfractionated 0.66 0.30 - 0.70 IU/mL    Comment:        IF HEPARIN RESULTS ARE BELOW EXPECTED VALUES, AND PATIENT DOSAGE HAS BEEN CONFIRMED, SUGGEST FOLLOW UP TESTING OF ANTITHROMBIN III LEVELS.   Glucose, capillary     Status: Abnormal   Collection Time: 05/18/16  4:23 PM  Result Value Ref Range   Glucose-Capillary 107 (H) 65 - 99 mg/dL   Comment 1 Notify RN    Comment 2 Document in Chart   Renal function panel     Status: Abnormal   Collection Time: 05/18/16  6:35 PM  Result Value Ref Range   Sodium 138 135 - 145 mmol/L   Potassium 3.9 3.5 - 5.1 mmol/L   Chloride 96 (L) 101 - 111 mmol/L   CO2 26 22 - 32 mmol/L   Glucose, Bld 146 (H) 65 - 99 mg/dL   BUN 60 (H) 6 - 20 mg/dL   Creatinine, Ser 9.13 (H) 0.44 - 1.00 mg/dL   Calcium 9.2 8.9 - 10.3 mg/dL   Phosphorus 8.2 (H) 2.5 - 4.6 mg/dL   Albumin 3.0 (L) 3.5 - 5.0 g/dL   GFR calc non Af Amer 4 (L) >60 mL/min   GFR calc Af Amer 4 (L) >60 mL/min    Comment: (NOTE) The eGFR has been calculated using the CKD EPI equation. This calculation has not been validated in all clinical situations. eGFR's persistently <60 mL/min signify possible Chronic Kidney Disease.    Anion gap 16 (H) 5 - 15  CBC     Status: Abnormal   Collection Time: 05/18/16  6:35 PM  Result Value Ref Range   WBC 15.8 (H) 4.0 - 10.5 K/uL   RBC 3.13 (L) 3.87 - 5.11 MIL/uL   Hemoglobin 9.7 (L) 12.0 - 15.0 g/dL   HCT 30.8 (L) 36.0 - 46.0 %   MCV 98.4 78.0 - 100.0 fL   MCH 31.0 26.0 - 34.0 pg   MCHC 31.5 30.0 - 36.0 g/dL   RDW 16.8 (H) 11.5 - 15.5 %   Platelets 392 150 - 400 K/uL   Glucose, capillary     Status: Abnormal   Collection Time: 05/18/16  7:21 PM  Result Value Ref Range   Glucose-Capillary 163 (H) 65 - 99 mg/dL  Glucose, capillary     Status: Abnormal   Collection Time: 05/19/16 12:51 AM  Result Value Ref Range   Glucose-Capillary 134 (H) 65 - 99 mg/dL  Heparin level (unfractionated)     Status: None   Collection Time: 05/19/16  4:23 AM  Result Value Ref Range   Heparin Unfractionated 0.48 0.30 - 0.70 IU/mL    Comment:        IF HEPARIN RESULTS ARE BELOW EXPECTED VALUES, AND PATIENT DOSAGE HAS BEEN CONFIRMED, SUGGEST FOLLOW UP TESTING OF ANTITHROMBIN III LEVELS.   CBC     Status: Abnormal  Collection Time: 05/19/16  4:23 AM  Result Value Ref Range   WBC 19.0 (H) 4.0 - 10.5 K/uL   RBC 3.03 (L) 3.87 - 5.11 MIL/uL   Hemoglobin 9.5 (L) 12.0 - 15.0 g/dL   HCT 29.9 (L) 36.0 - 46.0 %   MCV 98.7 78.0 - 100.0 fL   MCH 31.4 26.0 - 34.0 pg   MCHC 31.8 30.0 - 36.0 g/dL   RDW 16.8 (H) 11.5 - 15.5 %   Platelets 404 (H) 150 - 400 K/uL    Comment: SPECIMEN CHECKED FOR CLOTS  Comprehensive metabolic panel     Status: Abnormal   Collection Time: 05/19/16  4:23 AM  Result Value Ref Range   Sodium 137 135 - 145 mmol/L   Potassium 4.1 3.5 - 5.1 mmol/L   Chloride 95 (L) 101 - 111 mmol/L   CO2 25 22 - 32 mmol/L   Glucose, Bld 181 (H) 65 - 99 mg/dL   BUN 70 (H) 6 - 20 mg/dL   Creatinine, Ser 9.94 (H) 0.44 - 1.00 mg/dL   Calcium 8.9 8.9 - 10.3 mg/dL   Total Protein 6.4 (L) 6.5 - 8.1 g/dL   Albumin 2.8 (L) 3.5 - 5.0 g/dL   AST 36 15 - 41 U/L   ALT 178 (H) 14 - 54 U/L   Alkaline Phosphatase 75 38 - 126 U/L   Total Bilirubin 0.9 0.3 - 1.2 mg/dL   GFR calc non Af Amer 3 (L) >60 mL/min   GFR calc Af Amer 4 (L) >60 mL/min    Comment: (NOTE) The eGFR has been calculated using the CKD EPI equation. This calculation has not been validated in all clinical situations. eGFR's persistently <60 mL/min signify possible Chronic Kidney Disease.    Anion gap 17 (H)  5 - 15  Renal function panel     Status: Abnormal   Collection Time: 05/19/16  4:23 AM  Result Value Ref Range   Sodium 138 135 - 145 mmol/L   Potassium 4.1 3.5 - 5.1 mmol/L   Chloride 96 (L) 101 - 111 mmol/L   CO2 26 22 - 32 mmol/L   Glucose, Bld 181 (H) 65 - 99 mg/dL   BUN 71 (H) 6 - 20 mg/dL   Creatinine, Ser 10.20 (H) 0.44 - 1.00 mg/dL   Calcium 9.0 8.9 - 10.3 mg/dL   Phosphorus 8.0 (H) 2.5 - 4.6 mg/dL   Albumin 2.8 (L) 3.5 - 5.0 g/dL   GFR calc non Af Amer 3 (L) >60 mL/min   GFR calc Af Amer 4 (L) >60 mL/min    Comment: (NOTE) The eGFR has been calculated using the CKD EPI equation. This calculation has not been validated in all clinical situations. eGFR's persistently <60 mL/min signify possible Chronic Kidney Disease.    Anion gap 16 (H) 5 - 15  Glucose, capillary     Status: Abnormal   Collection Time: 05/19/16  5:14 AM  Result Value Ref Range   Glucose-Capillary 122 (H) 65 - 99 mg/dL  Blood gas, arterial     Status: Abnormal   Collection Time: 05/19/16  5:17 AM  Result Value Ref Range   FIO2 30.00    Delivery systems VENTILATOR    Mode PRESSURE REGULATED VOLUME CONTROL    VT 380 mL   LHR 14 resp/min   Peep/cpap 5.0 cm H20   pH, Arterial 7.329 (L) 7.350 - 7.450   pCO2 arterial 46.9 32.0 - 48.0 mmHg   pO2, Arterial 97.3 83.0 -  108.0 mmHg   Bicarbonate 23.0 20.0 - 28.0 mmol/L   Acid-base deficit 1.1 0.0 - 2.0 mmol/L   O2 Saturation 94.6 %   Patient temperature 37.0    Collection site RIGHT RADIAL    Drawn by [id number]    Sample type ARTERIAL DRAW    Allens test (pass/fail) POSITIVE (A) PASS    ABGS  Recent Labs  05/19/16 0517  PHART 7.329*  PO2ART 97.3  HCO3 23.0   CULTURES Recent Results (from the past 240 hour(s))  Culture, blood (routine x 2)     Status: None (Preliminary result)   Collection Time: 05/17/16 10:49 PM  Result Value Ref Range Status   Specimen Description BLOOD PICC  Final   Special Requests BOTTLES DRAWN AEROBIC AND ANAEROBIC 10CC  EACH  Final   Culture NO GROWTH < 12 HOURS  Final   Report Status PENDING  Incomplete  Culture, blood (routine x 2)     Status: None (Preliminary result)   Collection Time: 05/17/16 10:51 PM  Result Value Ref Range Status   Specimen Description BLOOD PICC  Final   Special Requests BOTTLES DRAWN AEROBIC AND ANAEROBIC 10CC EACH  Final   Culture NO GROWTH < 12 HOURS  Final   Report Status PENDING  Incomplete   Studies/Results: Dg Hand 2 View Right  Result Date: 05/18/2016 CLINICAL DATA:  Right hand swelling EXAM: RIGHT HAND - 2 VIEW COMPARISON:  None. FINDINGS: There is osteoarthritis at the base of the thumb metacarpal with subchondral sclerosis and cystic change at the base of the first proximal phalanx. Lesser degree of osteoarthritis involving the DIP joints of the second through fifth digits more so involving the index finger. Slight joint space narrowing of the third and fifth MCP. No chondrocalcinosis. There is diffuse soft tissue swelling about the hand more so dorsally. No frank bone destruction is seen. Carpal bones are grossly intact. IMPRESSION: Nonspecific soft tissue swelling of the visualized hand more so dorsally. Osteoarthritis at the base of the thumb metacarpal, DIP joints of the second through fifth digits and with slight joint space narrowing of the third and fifth MCP. Electronically Signed   By: Ashley Royalty M.D.   On: 05/18/2016 03:03   US Venous Img Upper Uni Right  Result Date: 05/18/2016 CLINICAL DATA:  Right hand swelling, redness and pain. Recent imaging demonstrating superficial thrombophlebitis within the right basilic vein. EXAM: RIGHT UPPER EXTREMITY VENOUS DOPPLER ULTRASOUND TECHNIQUE: Gray-scale sonography with graded compression, as well as color Doppler and duplex ultrasound were performed to evaluate the upper extremity deep venous system from the level of the subclavian vein and including the jugular, axillary, basilic, radial, ulnar and upper cephalic vein.  Spectral Doppler was utilized to evaluate flow at rest and with distal augmentation maneuvers. COMPARISON:  05/13/2016 FINDINGS: Contralateral Subclavian Vein: Respiratory phasicity is normal and symmetric with the symptomatic side. No evidence of thrombus. Normal compressibility. Internal Jugular Vein: No evidence of thrombus. Normal compressibility, respiratory phasicity and response to augmentation. Subclavian Vein: No evidence of thrombus. Normal compressibility, respiratory phasicity and response to augmentation. Axillary Vein: No evidence of thrombus. Normal compressibility, respiratory phasicity and response to augmentation. Cephalic Vein: No evidence of thrombus. Normal compressibility, respiratory phasicity and response to augmentation. Basilic Vein: Stable evidence of echogenic thrombus in a nondistended basilic vein. Overall appearance is more suggestive of chronic thrombus and may relate to prior catheter placement such as a PICC line. Given history of end-stage renal disease, this could also relate to chronic  occlusion post prior surgical dialysis access. Brachial Veins: No evidence of thrombus. Normal compressibility, respiratory phasicity and response to augmentation. Radial Veins: No evidence of thrombus. Normal compressibility, respiratory phasicity and response to augmentation. Ulnar Veins: No evidence of thrombus. Normal compressibility, respiratory phasicity and response to augmentation. Venous Reflux:  None visualized. Other Findings:  None visualized. IMPRESSION: Stable appearance of thrombus in the right basilic vein. Based on sonographic appearance, this is favored to more likely represent chronic thrombus rather than acute thrombus. This could relate to prior catheter placement or dialysis access. Electronically Signed   By: Aletta Edouard M.D.   On: 05/18/2016 16:35   Dg Chest Port 1 View  Result Date: 05/18/2016 CLINICAL DATA:  75 year old female currently intubated with respiratory  failure EXAM: PORTABLE CHEST 1 VIEW COMPARISON:  Prior chest x-ray 05/17/2016 FINDINGS: The tip the endotracheal tube is 4 cm above the carina. A right IJ tunneled PICC is present. The tip of the catheter overlies the distal SVC. A gastric tube is also present. The tip lies below the diaphragm and field-of-view, presumably within the stomach. Patient is status post median sternotomy with evidence of prior multivessel CABG. The cardiac silhouette and mediastinal contours remain unchanged. Atherosclerotic calcifications again noted in the transverse aorta. No significant pulmonary edema, pleural effusion, pneumothorax or focal airspace consolidation. There is likely mild bibasilar subsegmental atelectasis. No acute osseous abnormality. IMPRESSION: 1. Stable and satisfactory support apparatus. 2. No significant interval change in the appearance of the chest. Persistent mild bibasilar atelectasis. Electronically Signed   By: Jacqulynn Cadet M.D.   On: 05/18/2016 09:05   Dg Chest Port 1 View  Result Date: 05/17/2016 CLINICAL DATA:  Hypoxia EXAM: PORTABLE CHEST 1 VIEW COMPARISON:  May 13, 2016 FINDINGS: Endotracheal tube tip is 5.6 cm above the carina. Nasogastric tube tip and side port are below the diaphragm. Central catheter tip is in the superior vena cava. No pneumothorax. There is no edema or consolidation. The heart size and pulmonary vascularity are normal. No adenopathy. Patient is status post coronary artery bypass grafting. IMPRESSION: Tube and catheter positions as described without pneumothorax. No edema or consolidation. Stable cardiac silhouette. Electronically Signed   By: Lowella Grip III M.D.   On: 05/17/2016 09:43    Medications:  Prior to Admission:  Prescriptions Prior to Admission  Medication Sig Dispense Refill Last Dose  . ALPRAZolam (XANAX) 0.25 MG tablet Take 1-2 tablets by mouth at bedtime as needed for sleep.    05/08/2016 at Unknown time  . amiodarone (PACERONE) 200 MG  tablet Take 1 tablet (200 mg total) by mouth daily. 30 tablet 3 05/08/2016 at Unknown time  . aspirin EC 81 MG tablet Take 81 mg by mouth daily.    05/08/2016 at Unknown time  . cloNIDine (CATAPRES) 0.1 MG tablet Take 1 tablet (0.1 mg total) by mouth 2 (two) times daily. 60 tablet 11 05/08/2016 at Unknown time  . epoetin alfa (EPOGEN,PROCRIT) 90240 UNIT/ML injection Inject 1 mL (10,000 Units total) into the vein every Monday, Wednesday, and Friday with hemodialysis. 1 mL 10 05/06/2016 at Unknown time  . fluticasone (FLONASE) 50 MCG/ACT nasal spray Place 2 sprays into the nose daily as needed for allergies.    unknown  . labetalol (NORMODYNE) 200 MG tablet Take 1 tablet (200 mg total) by mouth 2 (two) times daily. 90 tablet 1 05/08/2016 at Unknown time  . lanthanum (FOSRENOL) 1000 MG chewable tablet Chew 1 tablet (1,000 mg total) by mouth 3 (three) times daily  with meals. 90 tablet 1 05/08/2016 at Unknown time  . multivitamin (RENA-VIT) TABS tablet Take 1 tablet by mouth daily.   05/08/2016 at Unknown time  . nitroGLYCERIN (NITROSTAT) 0.4 MG SL tablet Place 1 tablet (0.4 mg total) under the tongue every 5 (five) minutes as needed for chest pain. 25 tablet 3 05/08/2016 at Unknown time  . omeprazole (PRILOSEC) 20 MG capsule TAKE ONE CAPSULE BY MOUTH ONCE DAILY 30 capsule 5 05/08/2016 at Unknown time  . PROAIR HFA 108 (90 BASE) MCG/ACT inhaler Inhale 1 puff into the lungs every 6 (six) hours as needed for wheezing or shortness of breath.    unknown  . SENSIPAR 30 MG tablet Take 30 mg by mouth daily with supper.    05/08/2016 at Unknown time  . simvastatin (ZOCOR) 20 MG tablet Take 1 tablet (20 mg total) by mouth at bedtime. 90 tablet 3 05/07/2016 at Unknown time   Scheduled: . amiodarone  200 mg Oral Daily  . aspirin EC  81 mg Oral Daily  . chlorhexidine gluconate (MEDLINE KIT)  15 mL Mouth Rinse BID  . famotidine (PEPCID) IV  20 mg Intravenous Q24H  . mouth rinse  15 mL Mouth Rinse QID  .  multivitamin  1 tablet Oral Daily  . NIFEdipine  30 mg Oral Daily  . piperacillin-tazobactam (ZOSYN)  IV  3.375 g Intravenous Q12H  . polyethylene glycol  17 g Per Tube Daily  . vancomycin  500 mg Intravenous Q T,Th,Sa-HD   Continuous: . feeding supplement (VITAL AF 1.2 CAL) 1,000 mL (05/18/16 1545)  . heparin 800 Units/hr (05/19/16 0032)   TMH:DQQIWL chloride, sodium chloride, sodium chloride, sodium chloride, sodium chloride, sodium chloride, sodium chloride, sodium chloride, acetaminophen, acetaminophen, ALPRAZolam, alteplase, alteplase, fentaNYL (SUBLIMAZE) injection, fentaNYL (SUBLIMAZE) injection, fluticasone, heparin, heparin, ipratropium-albuterol, lidocaine (PF), lidocaine-prilocaine, midazolam, ondansetron (ZOFRAN) IV, pentafluoroprop-tetrafluoroeth  Assesment:She was admitted with hypertensive crisis. She had CODE BLUE 2 and has been on the ventilator since. She has not been able to wean because she is apneic when we try weaning. She does wake up but she is not following commands now. Neurology consultation has been requested and it is felt that this may be acute encephalopathy. The concern now is that if she's not able to breathe were going to need to do tracheostomy and consider chronic ventilation. Her husband is sleeping soundly in the room but I will talk to him when he arouses Principal Problem:   Hypertensive crisis Active Problems:   Coronary atherosclerosis of native coronary artery   ESRD on hemodialysis (HCC)   Anemia of chronic kidney failure   Chronic diastolic CHF (congestive heart failure) (HCC)   Chest pain   Acute respiratory failure ([place])   Hypertensive crisis without congestive heart failure   Cardiac arrest [facility])   Palliative care encounter   DNR (do not resuscitate) discussion   Goals of care, counseling/discussion    Plan: As above.    LOS: 10 days   Zenovia Justman L 05/19/2016, 7:23 AM

## 2016-05-19 NOTE — Progress Notes (Signed)
Subjective: Interval History: Patient very somnolent and not arousable. She remains intubated.  Objective: Vital signs in last 24 hours: Temp:  [97.5 F (36.4 C)-98.8 F (37.1 C)] 97.5 F (36.4 C) (11/09 0400) Pulse Rate:  [87-96] 93 (11/09 0834) Resp:  [0-25] 20 (11/09 0834) BP: (75-130)/(16-82) 87/54 (11/09 0834) SpO2:  [98 %-100 %] 100 % (11/09 0834) FiO2 (%):  [30 %] 30 % (11/09 0834) Weight:  [55.5 kg (122 lb 5.7 oz)] 55.5 kg (122 lb 5.7 oz) (11/09 0500) Weight change: 1.8 kg (3 lb 15.5 oz)  Intake/Output from previous day: 11/08 0701 - 11/09 0700 In: 1112 [I.V.:199.5; NG/GT:762.5; IV Piggyback:150] Out: 200 [Emesis/NG output:200] Intake/Output this shift: No intake/output data recorded.  Patient remains intubated. But very sleepy, difficult to wake her up. Chest: She has bilateral inspiratory crackles. Heart: RRR  Extremties: No edema  Lab Results:  Recent Labs  05/18/16 1835 05/19/16 0423  WBC 15.8* 19.0*  HGB 9.7* 9.5*  HCT 30.8* 29.9*  PLT 392 404*   BMET:   Recent Labs  05/18/16 1835 05/19/16 0423  NA 138 137  138  K 3.9 4.1  4.1  CL 96* 95*  96*  CO2 26 25  26   GLUCOSE 146* 181*  181*  BUN 60* 70*  71*  CREATININE 9.13* 9.94*  10.20*  CALCIUM 9.2 8.9  9.0   No results for input(s): PTH in the last 72 hours. Iron Studies: No results for input(s): IRON, TIBC, TRANSFERRIN, FERRITIN in the last 72 hours.  Studies/Results: Dg Hand 2 View Right  Result Date: 05/18/2016 CLINICAL DATA:  Right hand swelling EXAM: RIGHT HAND - 2 VIEW COMPARISON:  None. FINDINGS: There is osteoarthritis at the base of the thumb metacarpal with subchondral sclerosis and cystic change at the base of the first proximal phalanx. Lesser degree of osteoarthritis involving the DIP joints of the second through fifth digits more so involving the index finger. Slight joint space narrowing of the third and fifth MCP. No chondrocalcinosis. There is diffuse soft tissue swelling  about the hand more so dorsally. No frank bone destruction is seen. Carpal bones are grossly intact. IMPRESSION: Nonspecific soft tissue swelling of the visualized hand more so dorsally. Osteoarthritis at the base of the thumb metacarpal, DIP joints of the second through fifth digits and with slight joint space narrowing of the third and fifth MCP. Electronically Signed   By: Ashley Royalty M.D.   On: 05/18/2016 03:03   US Venous Img Upper Uni Right  Result Date: 05/18/2016 CLINICAL DATA:  Right hand swelling, redness and pain. Recent imaging demonstrating superficial thrombophlebitis within the right basilic vein. EXAM: RIGHT UPPER EXTREMITY VENOUS DOPPLER ULTRASOUND TECHNIQUE: Gray-scale sonography with graded compression, as well as color Doppler and duplex ultrasound were performed to evaluate the upper extremity deep venous system from the level of the subclavian vein and including the jugular, axillary, basilic, radial, ulnar and upper cephalic vein. Spectral Doppler was utilized to evaluate flow at rest and with distal augmentation maneuvers. COMPARISON:  05/13/2016 FINDINGS: Contralateral Subclavian Vein: Respiratory phasicity is normal and symmetric with the symptomatic side. No evidence of thrombus. Normal compressibility. Internal Jugular Vein: No evidence of thrombus. Normal compressibility, respiratory phasicity and response to augmentation. Subclavian Vein: No evidence of thrombus. Normal compressibility, respiratory phasicity and response to augmentation. Axillary Vein: No evidence of thrombus. Normal compressibility, respiratory phasicity and response to augmentation. Cephalic Vein: No evidence of thrombus. Normal compressibility, respiratory phasicity and response to augmentation. Basilic Vein: Stable evidence  of echogenic thrombus in a nondistended basilic vein. Overall appearance is more suggestive of chronic thrombus and may relate to prior catheter placement such as a PICC line. Given history  of end-stage renal disease, this could also relate to chronic occlusion post prior surgical dialysis access. Brachial Veins: No evidence of thrombus. Normal compressibility, respiratory phasicity and response to augmentation. Radial Veins: No evidence of thrombus. Normal compressibility, respiratory phasicity and response to augmentation. Ulnar Veins: No evidence of thrombus. Normal compressibility, respiratory phasicity and response to augmentation. Venous Reflux:  None visualized. Other Findings:  None visualized. IMPRESSION: Stable appearance of thrombus in the right basilic vein. Based on sonographic appearance, this is favored to more likely represent chronic thrombus rather than acute thrombus. This could relate to prior catheter placement or dialysis access. Electronically Signed   By: Aletta Edouard M.D.   On: 05/18/2016 16:35   Dg Chest Port 1 View  Result Date: 05/18/2016 CLINICAL DATA:  75 year old female currently intubated with respiratory failure EXAM: PORTABLE CHEST 1 VIEW COMPARISON:  Prior chest x-ray 05/17/2016 FINDINGS: The tip the endotracheal tube is 4 cm above the carina. A right IJ tunneled PICC is present. The tip of the catheter overlies the distal SVC. A gastric tube is also present. The tip lies below the diaphragm and field-of-view, presumably within the stomach. Patient is status post median sternotomy with evidence of prior multivessel CABG. The cardiac silhouette and mediastinal contours remain unchanged. Atherosclerotic calcifications again noted in the transverse aorta. No significant pulmonary edema, pleural effusion, pneumothorax or focal airspace consolidation. There is likely mild bibasilar subsegmental atelectasis. No acute osseous abnormality. IMPRESSION: 1. Stable and satisfactory support apparatus. 2. No significant interval change in the appearance of the chest. Persistent mild bibasilar atelectasis. Electronically Signed   By: Jacqulynn Cadet M.D.   On: 05/18/2016  09:05    I have reviewed the patient's current medications.  Assessment/Plan: Problem #1 patient with episode of bradycardia/unresponsive/status post CODE BLUE and intubated. patient today seems to be very sleepy and unresponsive. This morning she didn't get any Versed. Her last dose was around 4 AM for agitation and trying to pull her tube. No sure whether a side effect of the medication or she had encephalopathy for other reasons. Problem #2 hypotension: Her blood pressure this morning is low. Her systolic blood pressure is in low 80s. She is a febrile but her white blood cell count is increasing. Patient remained on antibiotics for pneumonia. Problem #3 end-stage renal disease: She is status post hemodialysis on Tuesday. Her potassium is normal. Patient is due for dialysis today. Problem #5 anemia: Her hemoglobin has come up to our  target goal. She is getting Epogen with dialysis. Problem #6 fluid management: As stated above. Patient remained intubated.  Problem #7 metabolic bone disease: Her calcium is normal but her phosphorus is high.  Problem #8 respiratory failure: Patient remained intubated. At this moment multiple attempts to wean her off was unsuccessful. At this moment with altered mental status and hypotension possibly becomes less likely. I tried to discuss with her husband and explained to him how seriously she sick but he insists that we need to do everything possible to treat her. Plan: 1] will hold her dialysis today as patient seems to be hypotensive and unstable. I also explained to her husband. 2] will check renal panel in the morning 3] will try tomorrow if patient becomes more stable.   LOS: 10 days   Shawna [name] S 05/19/2016,9:23 AM

## 2016-05-19 NOTE — Progress Notes (Signed)
Pt receiving bath at time.  BP cuff off.

## 2016-05-19 NOTE — Progress Notes (Signed)
[place] A. Merlene Laughter, MD     [email or web address]          Shawna [name] is an 75 y.o. female.   Assessment/Plan: Failure to wean with the etiology being unclear at this time. The patient seemed to be awake enough that level of arousal should not be the etiology. This type of picture is most often seen in critical illness myopathy/neuropathy. [Risk factors are typically seen in acute intubation, use of neuromuscular blocking agents and steroids.] If this is the case, there are no known treatments other than observation. We will follow the patient and see how she progresses. I will check for other types of neuromuscular diseases such as myasthenia gravis and myopathy. Otherwise I think we should continue with supportive care.  Moderate encephalopathy likely due to toxic metabolic etiologies. She likely has had some anoxic brain injury but it does not appears to be severe.  Suggestion of right-leg weakness. Consider CT scan for further evaluation.    The patient is less responsive today. She was given Versed about 12 midnight and fentanyl 4 AM this morning.      GENERAL: The patient appears to be in moderate discomfort but in no acute distress. She is intubated and was just given sedatives.  HEENT: Supple. Atraumatic normocephalic.   ABDOMEN: soft  EXTREMITIES: No edema   BACK: Normal.  SKIN: Normal by inspection.    MENTAL STATUS: She lays in bed with eyes closed, but she opens eyes briefly to painful sternal rub. She does not follow commands but she states her head yes and respond to her name.  CRANIAL NERVES: Pupils are equal, round and reactive to light; extra ocular movements are full, there is no significant nystagmus; upper and lower facial muscles are normal in strength and symmetric, there is no flattening of the nasolabial folds.  MOTOR: She moves upper extremities equally against gravity. She moves less today both sides  2/5.  COORDINATION: No tremors or dysmetria is observed.  REFLEXES: Deep tendon reflexes are symmetrical and normal. Babinski reflexes are flexor bilaterally.   SENSATION: She responds to painful stimuli bilaterally.    Objective: Vital signs in last 24 hours: Temp:  [97.5 F (36.4 C)-98.8 F (37.1 C)] 97.5 F (36.4 C) (11/09 0400) Pulse Rate:  [87-96] 91 (11/09 0600) Resp:  [0-25] 10 (11/09 0600) BP: (75-130)/(16-82) 91/52 (11/09 0600) SpO2:  [98 %-100 %] 100 % (11/09 0600) FiO2 (%):  [30 %-35 %] 30 % (11/09 0459) Weight:  [122 lb 5.7 oz (55.5 kg)] 122 lb 5.7 oz (55.5 kg) (11/09 0500)  Intake/Output from previous day: 11/08 0701 - 11/09 0700 In: 1112 [I.V.:199.5; NG/GT:762.5; IV Piggyback:150] Out: 200 [Emesis/NG output:200] Intake/Output this shift: No intake/output data recorded. Nutritional status: Diet NPO time specified   Lab Results: Results for orders placed or performed during the hospital encounter of 05/08/16 (from the past 48 hour(s))  Glucose, capillary     Status: Abnormal   Collection Time: 05/17/16 12:22 PM  Result Value Ref Range   Glucose-Capillary 37 (LL) 65 - 99 mg/dL   Comment 1 Notify RN   Glucose, capillary     Status: Abnormal   Collection Time: 05/17/16 12:42 PM  Result Value Ref Range   Glucose-Capillary 35 (LL) 65 - 99 mg/dL   Comment 1 Notify RN   Glucose, capillary     Status: None   Collection Time: 05/17/16 12:43 PM  Result Value Ref Range   Glucose-Capillary 93 65 - 99 mg/dL  Glucose, capillary     Status: Abnormal   Collection Time: 05/17/16  4:07 PM  Result Value Ref Range   Glucose-Capillary 61 (L) 65 - 99 mg/dL  Glucose, capillary     Status: Abnormal   Collection Time: 05/17/16  5:00 PM  Result Value Ref Range   Glucose-Capillary 173 (H) 65 - 99 mg/dL  Glucose, capillary     Status: None   Collection Time: 05/17/16  7:53 PM  Result Value Ref Range   Glucose-Capillary 95 65 - 99 mg/dL   Comment 1 Notify RN   Culture,  blood (routine x 2)     Status: None (Preliminary result)   Collection Time: 05/17/16 10:49 PM  Result Value Ref Range   Specimen Description BLOOD PICC    Special Requests BOTTLES DRAWN AEROBIC AND ANAEROBIC 10CC EACH    Culture NO GROWTH < 12 HOURS    Report Status PENDING   Culture, blood (routine x 2)     Status: None (Preliminary result)   Collection Time: 05/17/16 10:51 PM  Result Value Ref Range   Specimen Description BLOOD PICC    Special Requests BOTTLES DRAWN AEROBIC AND ANAEROBIC 10CC EACH    Culture NO GROWTH < 12 HOURS    Report Status PENDING   Lactic acid, plasma     Status: None   Collection Time: 05/17/16 10:51 PM  Result Value Ref Range   Lactic Acid, Venous 1.2 0.5 - 1.9 mmol/L  Glucose, capillary     Status: None   Collection Time: 05/17/16 11:59 PM  Result Value Ref Range   Glucose-Capillary 95 65 - 99 mg/dL   Comment 1 Notify RN   Basic metabolic panel     Status: Abnormal   Collection Time: 05/18/16  1:39 AM  Result Value Ref Range   Sodium 137 135 - 145 mmol/L   Potassium 3.6 3.5 - 5.1 mmol/L    Comment: DELTA CHECK NOTED   Chloride 97 (L) 101 - 111 mmol/L   CO2 26 22 - 32 mmol/L   Glucose, Bld 170 (H) 65 - 99 mg/dL   BUN 43 (H) 6 - 20 mg/dL   Creatinine, Ser 7.28 (H) 0.44 - 1.00 mg/dL    Comment: DELTA CHECK NOTED   Calcium 9.0 8.9 - 10.3 mg/dL   GFR calc non Af Amer 5 (L) >60 mL/min   GFR calc Af Amer 6 (L) >60 mL/min    Comment: (NOTE) The eGFR has been calculated using the CKD EPI equation. This calculation has not been validated in all clinical situations. eGFR's persistently <60 mL/min signify possible Chronic Kidney Disease.    Anion gap 14 5 - 15  CBC     Status: Abnormal   Collection Time: 05/18/16  1:39 AM  Result Value Ref Range   WBC 10.4 4.0 - 10.5 K/uL   RBC 3.04 (L) 3.87 - 5.11 MIL/uL   Hemoglobin 9.4 (L) 12.0 - 15.0 g/dL   HCT 30.2 (L) 36.0 - 46.0 %   MCV 99.3 78.0 - 100.0 fL   MCH 30.9 26.0 - 34.0 pg   MCHC 31.1 30.0 -  36.0 g/dL   RDW 16.7 (H) 11.5 - 15.5 %   Platelets 338 150 - 400 K/uL  Lactic acid, plasma     Status: None   Collection Time: 05/18/16  1:39 AM  Result Value Ref Range   Lactic Acid, Venous 1.1 0.5 - 1.9 mmol/L  Glucose, capillary     Status: Abnormal  Collection Time: 05/18/16  3:55 AM  Result Value Ref Range   Glucose-Capillary 114 (H) 65 - 99 mg/dL   Comment 1 Notify RN   Blood gas, arterial     Status: Abnormal   Collection Time: 05/18/16  4:35 AM  Result Value Ref Range   FIO2 35.00    Delivery systems VENTILATOR    Mode PRESSURE REGULATED VOLUME CONTROL    VT 380 mL   LHR 14 resp/min   Peep/cpap 5.0 cm H20   pH, Arterial 7.430 7.350 - 7.450   pCO2 arterial 41.7 32.0 - 48.0 mmHg   pO2, Arterial 133 (H) 83.0 - 108.0 mmHg   Bicarbonate 27.1 20.0 - 28.0 mmol/L   Acid-Base Excess 3.2 (H) 0.0 - 2.0 mmol/L   O2 Saturation 97.7 %   Patient temperature 37.0    Collection site RIGHT RADIAL    Drawn by [id number]    Sample type ARTERIAL DRAW    Allens test (pass/fail) POSITIVE (A) PASS  Heparin level (unfractionated)     Status: Abnormal   Collection Time: 05/18/16  7:16 AM  Result Value Ref Range   Heparin Unfractionated 0.94 (H) 0.30 - 0.70 IU/mL    Comment:        IF HEPARIN RESULTS ARE BELOW EXPECTED VALUES, AND PATIENT DOSAGE HAS BEEN CONFIRMED, SUGGEST FOLLOW UP TESTING OF ANTITHROMBIN III LEVELS.   Glucose, capillary     Status: Abnormal   Collection Time: 05/18/16  7:31 AM  Result Value Ref Range   Glucose-Capillary 101 (H) 65 - 99 mg/dL   Comment 1 Notify RN    Comment 2 Document in Chart   Glucose, capillary     Status: Abnormal   Collection Time: 05/18/16 11:19 AM  Result Value Ref Range   Glucose-Capillary 110 (H) 65 - 99 mg/dL   Comment 1 Notify RN    Comment 2 Document in Chart   Heparin level (unfractionated)     Status: None   Collection Time: 05/18/16  1:54 PM  Result Value Ref Range   Heparin Unfractionated 0.66 0.30 - 0.70 IU/mL    Comment:         IF HEPARIN RESULTS ARE BELOW EXPECTED VALUES, AND PATIENT DOSAGE HAS BEEN CONFIRMED, SUGGEST FOLLOW UP TESTING OF ANTITHROMBIN III LEVELS.   Glucose, capillary     Status: Abnormal   Collection Time: 05/18/16  4:23 PM  Result Value Ref Range   Glucose-Capillary 107 (H) 65 - 99 mg/dL   Comment 1 Notify RN    Comment 2 Document in Chart   Renal function panel     Status: Abnormal   Collection Time: 05/18/16  6:35 PM  Result Value Ref Range   Sodium 138 135 - 145 mmol/L   Potassium 3.9 3.5 - 5.1 mmol/L   Chloride 96 (L) 101 - 111 mmol/L   CO2 26 22 - 32 mmol/L   Glucose, Bld 146 (H) 65 - 99 mg/dL   BUN 60 (H) 6 - 20 mg/dL   Creatinine, Ser 9.13 (H) 0.44 - 1.00 mg/dL   Calcium 9.2 8.9 - 10.3 mg/dL   Phosphorus 8.2 (H) 2.5 - 4.6 mg/dL   Albumin 3.0 (L) 3.5 - 5.0 g/dL   GFR calc non Af Amer 4 (L) >60 mL/min   GFR calc Af Amer 4 (L) >60 mL/min    Comment: (NOTE) The eGFR has been calculated using the CKD EPI equation. This calculation has not been validated in all clinical situations. eGFR's persistently <60 mL/min  signify possible Chronic Kidney Disease.    Anion gap 16 (H) 5 - 15  CBC     Status: Abnormal   Collection Time: 05/18/16  6:35 PM  Result Value Ref Range   WBC 15.8 (H) 4.0 - 10.5 K/uL   RBC 3.13 (L) 3.87 - 5.11 MIL/uL   Hemoglobin 9.7 (L) 12.0 - 15.0 g/dL   HCT 30.8 (L) 36.0 - 46.0 %   MCV 98.4 78.0 - 100.0 fL   MCH 31.0 26.0 - 34.0 pg   MCHC 31.5 30.0 - 36.0 g/dL   RDW 16.8 (H) 11.5 - 15.5 %   Platelets 392 150 - 400 K/uL  Glucose, capillary     Status: Abnormal   Collection Time: 05/18/16  7:21 PM  Result Value Ref Range   Glucose-Capillary 163 (H) 65 - 99 mg/dL  Glucose, capillary     Status: Abnormal   Collection Time: 05/19/16 12:51 AM  Result Value Ref Range   Glucose-Capillary 134 (H) 65 - 99 mg/dL  Heparin level (unfractionated)     Status: None   Collection Time: 05/19/16  4:23 AM  Result Value Ref Range   Heparin Unfractionated 0.48 0.30 -  0.70 IU/mL    Comment:        IF HEPARIN RESULTS ARE BELOW EXPECTED VALUES, AND PATIENT DOSAGE HAS BEEN CONFIRMED, SUGGEST FOLLOW UP TESTING OF ANTITHROMBIN III LEVELS.   CBC     Status: Abnormal   Collection Time: 05/19/16  4:23 AM  Result Value Ref Range   WBC 19.0 (H) 4.0 - 10.5 K/uL   RBC 3.03 (L) 3.87 - 5.11 MIL/uL   Hemoglobin 9.5 (L) 12.0 - 15.0 g/dL   HCT 29.9 (L) 36.0 - 46.0 %   MCV 98.7 78.0 - 100.0 fL   MCH 31.4 26.0 - 34.0 pg   MCHC 31.8 30.0 - 36.0 g/dL   RDW 16.8 (H) 11.5 - 15.5 %   Platelets 404 (H) 150 - 400 K/uL    Comment: SPECIMEN CHECKED FOR CLOTS  Comprehensive metabolic panel     Status: Abnormal   Collection Time: 05/19/16  4:23 AM  Result Value Ref Range   Sodium 137 135 - 145 mmol/L   Potassium 4.1 3.5 - 5.1 mmol/L   Chloride 95 (L) 101 - 111 mmol/L   CO2 25 22 - 32 mmol/L   Glucose, Bld 181 (H) 65 - 99 mg/dL   BUN 70 (H) 6 - 20 mg/dL   Creatinine, Ser 9.94 (H) 0.44 - 1.00 mg/dL   Calcium 8.9 8.9 - 10.3 mg/dL   Total Protein 6.4 (L) 6.5 - 8.1 g/dL   Albumin 2.8 (L) 3.5 - 5.0 g/dL   AST 36 15 - 41 U/L   ALT 178 (H) 14 - 54 U/L   Alkaline Phosphatase 75 38 - 126 U/L   Total Bilirubin 0.9 0.3 - 1.2 mg/dL   GFR calc non Af Amer 3 (L) >60 mL/min   GFR calc Af Amer 4 (L) >60 mL/min    Comment: (NOTE) The eGFR has been calculated using the CKD EPI equation. This calculation has not been validated in all clinical situations. eGFR's persistently <60 mL/min signify possible Chronic Kidney Disease.    Anion gap 17 (H) 5 - 15  Renal function panel     Status: Abnormal   Collection Time: 05/19/16  4:23 AM  Result Value Ref Range   Sodium 138 135 - 145 mmol/L   Potassium 4.1 3.5 - 5.1 mmol/L  Chloride 96 (L) 101 - 111 mmol/L   CO2 26 22 - 32 mmol/L   Glucose, Bld 181 (H) 65 - 99 mg/dL   BUN 71 (H) 6 - 20 mg/dL   Creatinine, Ser 10.20 (H) 0.44 - 1.00 mg/dL   Calcium 9.0 8.9 - 10.3 mg/dL   Phosphorus 8.0 (H) 2.5 - 4.6 mg/dL   Albumin 2.8 (L) 3.5 -  5.0 g/dL   GFR calc non Af Amer 3 (L) >60 mL/min   GFR calc Af Amer 4 (L) >60 mL/min    Comment: (NOTE) The eGFR has been calculated using the CKD EPI equation. This calculation has not been validated in all clinical situations. eGFR's persistently <60 mL/min signify possible Chronic Kidney Disease.    Anion gap 16 (H) 5 - 15  Glucose, capillary     Status: Abnormal   Collection Time: 05/19/16  5:14 AM  Result Value Ref Range   Glucose-Capillary 122 (H) 65 - 99 mg/dL  Blood gas, arterial     Status: Abnormal   Collection Time: 05/19/16  5:17 AM  Result Value Ref Range   FIO2 30.00    Delivery systems VENTILATOR    Mode PRESSURE REGULATED VOLUME CONTROL    VT 380 mL   LHR 14 resp/min   Peep/cpap 5.0 cm H20   pH, Arterial 7.329 (L) 7.350 - 7.450   pCO2 arterial 46.9 32.0 - 48.0 mmHg   pO2, Arterial 97.3 83.0 - 108.0 mmHg   Bicarbonate 23.0 20.0 - 28.0 mmol/L   Acid-base deficit 1.1 0.0 - 2.0 mmol/L   O2 Saturation 94.6 %   Patient temperature 37.0    Collection site RIGHT RADIAL    Drawn by [id number]    Sample type ARTERIAL DRAW    Allens test (pass/fail) POSITIVE (A) PASS  Glucose, capillary     Status: Abnormal   Collection Time: 05/19/16  7:42 AM  Result Value Ref Range   Glucose-Capillary 170 (H) 65 - 99 mg/dL   Comment 1 Notify RN    Comment 2 Document in Chart     Lipid Panel No results for input(s): CHOL, TRIG, HDL, CHOLHDL, VLDL, LDLCALC in the last 72 hours.  Studies/Results:   Medications:  Scheduled Meds: . amiodarone  200 mg Oral Daily  . aspirin EC  81 mg Oral Daily  . chlorhexidine gluconate (MEDLINE KIT)  15 mL Mouth Rinse BID  . famotidine (PEPCID) IV  20 mg Intravenous Q24H  . mouth rinse  15 mL Mouth Rinse QID  . multivitamin  1 tablet Oral Daily  . NIFEdipine  30 mg Oral Daily  . piperacillin-tazobactam (ZOSYN)  IV  3.375 g Intravenous Q12H  . polyethylene glycol  17 g Per Tube Daily  . vancomycin  500 mg Intravenous Q T,Th,Sa-HD   Continuous  Infusions: . feeding supplement (VITAL AF 1.2 CAL) 1,000 mL (05/18/16 1545)  . heparin 800 Units/hr (05/19/16 0032)   PRN Meds:.sodium chloride, sodium chloride, sodium chloride, sodium chloride, sodium chloride, sodium chloride, sodium chloride, sodium chloride, acetaminophen, acetaminophen, ALPRAZolam, alteplase, alteplase, fentaNYL (SUBLIMAZE) injection, fentaNYL (SUBLIMAZE) injection, fluticasone, heparin, heparin, ipratropium-albuterol, lidocaine (PF), lidocaine-prilocaine, midazolam, ondansetron (ZOFRAN) IV, pentafluoroprop-tetrafluoroeth     LOS: 10 days   Willie Loy A. Merlene Laughter, M.D.  Diplomate, Tax adviser of Psychiatry and Neurology ( Neurology).

## 2016-05-19 NOTE — Progress Notes (Signed)
ANTICOAGULATION CONSULT NOTE - follow up  Pharmacy Consult for Heparin Indication: R/O DVT  Patient Measurements: Height: 5' 1"  (154.9 cm) Weight: 122 lb 5.7 oz (55.5 kg) IBW/kg (Calculated) : 47.8  HEPARIN DW (KG): 53.7   Vital Signs: Temp: 97.9 F (36.6 C) (11/09 0730) Temp Source: Axillary (11/09 0730) BP: 127/62 (11/09 0900) Pulse Rate: 92 (11/09 0900)  Labs:  Recent Labs  05/18/16 0139 05/18/16 0716 05/18/16 1354 05/18/16 1835 05/19/16 0423  HGB 9.4*  --   --  9.7* 9.5*  HCT 30.2*  --   --  30.8* 29.9*  PLT 338  --   --  392 404*  HEPARINUNFRC  --  0.94* 0.66  --  0.48  CREATININE 7.28*  --   --  9.13* 9.94*  10.20*   Estimated Creatinine Clearance: 3.6 mL/min (by C-G formula based on SCr of 9.94 mg/dL (H)).  Medical History: Past Medical History:  Diagnosis Date  . Anxiety   . Arthritis   . AVM (arteriovenous malformation) of colon   . CAD (coronary artery disease)    a. 12/2011 NSTEMI/Cath/PCI LCX (2.25x14 Resolute DES) & D1 (2.25x22 Resolute DES);  b. 01/2012 Cath/PCI: LM 30, LAD 30p, 40-62m D1 stent ok, 99 in sm branch of diag, LCX patent stent, OM1 20, RCA 95 ost (4.0x12 Promus DES), EF 55%;  c. 04/2012 Lexi Cardiolite  EF 48%, small area of scar @ base/mid inflat wall with mild peri-infarct ischemia.; CABG 12/4  . Carotid artery disease ([facility]    a. 691-91%LICA, 96/6060  . Chronic bronchitis ([facility]   . Chronic diastolic CHF (congestive heart failure) ([facility]    a. 02/2012 Echo EF 60-65%, nl wall motion, Gr 1 DD, mod MR  . Colon cancer ([facility] 1992  . Esophageal stricture   . ESRD on hemodialysis ([facility]    ESRD due to HTN, [facility] 2011 [facility]  Access is LUA AVF as of Sept 2014.   .Marland KitchenGERD (gastroesophageal reflux disease)   . High cholesterol 12/2011  . History of blood transfusion 07/2011; 12/2011; 01/2012 X 2; 04/2012  . History of gout   . History of lower GI bleeding   . Hypertension   . Iron  deficiency anemia   . Mitral regurgitation    a. Moderate by echo, 02/2012  . Myocardial infarction   . [place]varian cancer [place] [place]  . Pneumonia ~ 2009  . PUD (peptic ulcer disease)   . TIA (transient ischemic attack)    Medications:  Scheduled:  . amiodarone  200 mg Oral Daily  . aspirin EC  81 mg Oral Daily  . chlorhexidine gluconate (MEDLINE KIT)  15 mL Mouth Rinse BID  . famotidine (PEPCID) IV  20 mg Intravenous Q24H  . mouth rinse  15 mL Mouth Rinse QID  . multivitamin  1 tablet Oral Daily  . piperacillin-tazobactam (ZOSYN)  IV  3.375 g Intravenous Q12H  . polyethylene glycol  17 g Per Tube Daily  . vancomycin  500 mg Intravenous Q T,Th,Sa-HD   Assessment: Okay for Protocol, r/o DVT.  Venous dopplers reporting stable appearance of thrombus in the right basilic vein. IV heparin until DVT ruled out. Troponins trending down, cardiology following and felt may be demand ischemia/related to CPR.  Heparin level is therapeutic.  Goal of Therapy:  Heparin level 0.3-0.7 units/ml Monitor platelets by anticoagulation protocol: Yes   Plan:  Continue Heparin infusion at 800 units/hr Heparin  level and CBC daily Monitor for s/s of bleeding  Isac Sarna, BS Vena Austria, BCPS Clinical Pharmacist Pager [phone]  05/19/2016,10:27 AM

## 2016-05-19 NOTE — Progress Notes (Signed)
PROGRESS NOTE    Shawna [name]  ZOX:[id number] DOB: Nov 04, 1940 DOA: 05/08/2016 PCP: [name], MD    Brief Narrative:  75 yof with a history of CAD and ESRD, presented with acute onset of chest pain unrelieved by aspirin and nitroglycerin. In the emergency department BP 230/130 and she was noted to have new ST depression. She was started on nitroglycerin infusion and IV labetalol and admitted for hypertensive crisis with chest pain.Her chest pain had resolved but she remained hypertensive. On 11/1 patient suddenly became bradycardic and unresponsive. She underwent CPR, chest compressions, received epinephrine and had ROSC. On 11/2 prior to starting her dialysis she was noted to be hypertensive so she was given IV labetalol and developed bradycardia with subsequent PEA arrest. She underwent CPR again for < 58mns and had ROSC. She currently remains on ventilator. Weaning has been difficult due to periods of apnea. Pulmonology and Neurology assisting.  Assessment & Plan:   Principal Problem:   Hypertensive crisis Active Problems:   Coronary atherosclerosis of native coronary artery   ESRD on hemodialysis (HCC)   Anemia of chronic kidney failure   Chronic diastolic CHF (congestive heart failure) (HCC)   Chest pain   Acute respiratory failure ([facility]   Hypertensive crisis without congestive heart failure   Cardiac arrest ([facility]   Palliative care encounter   DNR (do not resuscitate) discussion   Goals of care, counseling/discussion  1. Bradycardia with PEA cardiac arrest. On 11/1, patient's heart rate became bradycardic into PEA. She underwent CPR, received epinephrine, bicarbonate and calcium chloride. She had ROSC after approximately 25 mins of CPR. Lab work had indicated an elevated potassium of 6.4, which may have been cause. She was brieflystarted on a epinephrine infusion which was quicklyweaned off. CXR did not show any acute process. Cardiology following. 2. Acute respiratory  failure: Ventilator dependent. Patient intubated while undergoing CPR. Pulmonology following. She is undergoing daily weaning trials. She remains hemodynamically stable. Weaning has been unsuccessful due to intermittent periods of apnea. Neurology on board reportedly patient is more somnolent today. 3. Hypertensive crisis with chest pain. Patient was admitted with a blood pressure of 230/130 and chest pain. She was started on nitroglycerin and IV labetalol which have since been weanedoff. Her chest pain had resolved. Blood pressures have been soft 4. Elevated troponin. Patient has a history of CAD status post CABG and stenting. Patient had no evidence of ACS on admission. Since that time, her troponin has started to rise. Cardiology following and felt that this may be demand ischemia/related to CPR. Troponin peak was 1.5 and has since trended down since.  5. ESRD on HD MWF. Nephrology is following for dialysis needs.  6. Chronic diastolic congestive heart failure. Patient has an echocardiogram on 09/2015 with an EF of 681-19% grade 2 diastolic dysfunction. Continue fluid management with dialysis. 7. PSVT, maintained on amiodarone. EKG remains in sinus rhythm.  8. Myoclonus. Suspect secondary to CKD/ESRD on HD. No history or objective findings to suggest acute CNS process. Reviewed meds, no likely offenders. Monitor for now 9. Hyperkalemia. Resolved.  10. Elevated TSH. Recheck as an outpatient. 11. Anemia of chronic kidney disease. Hemoglobin currently stable. Will continue to monitor. 12. Right hand swelling. Started on IV antibiotics due to concern of cellulitis. Xray of hand does not show any gas. Venous dopplers reporting stable appearance of thrombus in the right basilic vein. IV heparin until DVT ruled out.  DVT prophylaxis: Heparin infusion Code Status: FULL  Family Communication: husband at bedside  Disposition Plan: pending clinical improvement    Consultants:   Nephrology    Cardiology  Pulmonology  Nutrition   Neurology  Procedures:   Intubation 11/1>>  Antimicrobials  Vancomycin 11/7>>  Zosyn 11/7>>   Subjective: Intubated and sedated   Objective: Vitals:   05/19/16 0500 05/19/16 0600 05/19/16 0730 05/19/16 0834  BP: 123/75 (!) 91/52  (!) 87/54  Pulse:  91  93  Resp: 14 10  20   Temp:   97.9 F (36.6 C)   TempSrc:   Axillary   SpO2:  100%  100%  Weight: 55.5 kg (122 lb 5.7 oz)     Height:        Intake/Output Summary (Last 24 hours) at 05/19/16 0932 Last data filed at 05/19/16 0400  Gross per 24 hour  Intake             1112 ml  Output              200 ml  Net              912 ml   Filed Weights   05/17/16 0830 05/18/16 0500 05/19/16 0500  Weight: 53.7 kg (118 lb 6.2 oz) 54.8 kg (120 lb 13 oz) 55.5 kg (122 lb 5.7 oz)    Examination:  General exam: Appears calm and comfortable, in nad. Respiratory system: Clear to auscultation. Respiratory effort normal. Cardiovascular system: S1 & S2 heard, RRR. No JVD, murmurs, rubs, gallops or clicks. No pedal edema. Gastrointestinal system: Abdomen is nondistended, soft and nontender. No organomegaly or masses felt. Normal bowel sounds heard. Central nervous system: somnolent, reacts to noxious stimuli Extremities: right hand is edematous. Pulses are palpable. Erythema and warmth noted over hand. Skin: No rashes, lesions or ulcers Psychiatry: cannot assess    Data Reviewed: I have personally reviewed following labs and imaging studies  CBC:  Recent Labs Lab 05/16/16 0435 05/17/16 0437 05/18/16 0139 05/18/16 1835 05/19/16 0423  WBC 11.4* 11.0* 10.4 15.8* 19.0*  HGB 10.4* 10.5* 9.4* 9.7* 9.5*  HCT 33.5* 33.4* 30.2* 30.8* 29.9*  MCV 99.1 98.8 99.3 98.4 98.7  PLT 304 355 338 392 220*   Basic Metabolic Panel:  Recent Labs Lab 05/13/16 0430 05/14/16 0408  05/16/16 0435 05/17/16 0437 05/18/16 0139 05/18/16 1835 05/19/16 0423  NA 138 140  < > 140 142 137 138 137   138  K 4.4 4.4  < > 3.9 4.4 3.6 3.9 4.1  4.1  CL 96* 96*  < > 93* 96* 97* 96* 95*  96*  CO2 27 26  < > 26 23 26 26 25  26   GLUCOSE 127* 108*  < > 85 85 170* 146* 181*  181*  BUN 47* 70*  < > 70* 97* 43* 60* 70*  71*  CREATININE 6.74* 9.58*  < > 9.57* 12.31* 7.28* 9.13* 9.94*  10.20*  CALCIUM 8.4* 8.6*  < > 9.2 9.4 9.0 9.2 8.9  9.0  PHOS 6.1* 7.3*  --   --   --   --  8.2* 8.0*  < > = values in this interval not displayed. GFR: Estimated Creatinine Clearance: 3.6 mL/min (by C-G formula based on SCr of 9.94 mg/dL (H)). Liver Function Tests:  Recent Labs Lab 05/13/16 0430 05/14/16 0408 05/18/16 1835 05/19/16 0423  AST  --   --   --  36  ALT  --   --   --  178*  ALKPHOS  --   --   --  75  BILITOT  --   --   --  0.9  PROT  --   --   --  6.4*  ALBUMIN 3.1* 3.0* 3.0* 2.8*  2.8*   No results for input(s): LIPASE, AMYLASE in the last 168 hours. No results for input(s): AMMONIA in the last 168 hours. Coagulation Profile: No results for input(s): INR, PROTIME in the last 168 hours. Cardiac Enzymes: No results for input(s): CKTOTAL, CKMB, CKMBINDEX, TROPONINI in the last 168 hours. BNP (last 3 results) No results for input(s): PROBNP in the last 8760 hours. HbA1C: No results for input(s): HGBA1C in the last 72 hours. CBG:  Recent Labs Lab 05/18/16 1623 05/18/16 1921 05/19/16 0051 05/19/16 0514 05/19/16 0742  GLUCAP 107* 163* 134* 122* 170*   Lipid Profile: No results for input(s): CHOL, HDL, LDLCALC, TRIG, CHOLHDL, LDLDIRECT in the last 72 hours. Thyroid Function Tests: No results for input(s): TSH, T4TOTAL, FREET4, T3FREE, THYROIDAB in the last 72 hours. Anemia Panel: No results for input(s): VITAMINB12, FOLATE, FERRITIN, TIBC, IRON, RETICCTPCT in the last 72 hours. Sepsis Labs:  Recent Labs Lab 05/17/16 2251 05/18/16 0139  LATICACIDVEN 1.2 1.1    Recent Results (from the past 240 hour(s))  Culture, blood (routine x 2)     Status: None (Preliminary result)    Collection Time: 05/17/16 10:49 PM  Result Value Ref Range Status   Specimen Description BLOOD PICC  Final   Special Requests BOTTLES DRAWN AEROBIC AND ANAEROBIC 10CC EACH  Final   Culture NO GROWTH 2 DAYS  Final   Report Status PENDING  Incomplete  Culture, blood (routine x 2)     Status: None (Preliminary result)   Collection Time: 05/17/16 10:51 PM  Result Value Ref Range Status   Specimen Description BLOOD PICC  Final   Special Requests BOTTLES DRAWN AEROBIC AND ANAEROBIC 10CC EACH  Final   Culture NO GROWTH 2 DAYS  Final   Report Status PENDING  Incomplete         Radiology Studies: Dg Hand 2 View Right  Result Date: 05/18/2016 CLINICAL DATA:  Right hand swelling EXAM: RIGHT HAND - 2 VIEW COMPARISON:  None. FINDINGS: There is osteoarthritis at the base of the thumb metacarpal with subchondral sclerosis and cystic change at the base of the first proximal phalanx. Lesser degree of osteoarthritis involving the DIP joints of the second through fifth digits more so involving the index finger. Slight joint space narrowing of the third and fifth MCP. No chondrocalcinosis. There is diffuse soft tissue swelling about the hand more so dorsally. No frank bone destruction is seen. Carpal bones are grossly intact. IMPRESSION: Nonspecific soft tissue swelling of the visualized hand more so dorsally. Osteoarthritis at the base of the thumb metacarpal, DIP joints of the second through fifth digits and with slight joint space narrowing of the third and fifth MCP. Electronically Signed   By: Ashley Royalty M.D.   On: 05/18/2016 03:03   US Venous Img Upper Uni Right  Result Date: 05/18/2016 CLINICAL DATA:  Right hand swelling, redness and pain. Recent imaging demonstrating superficial thrombophlebitis within the right basilic vein. EXAM: RIGHT UPPER EXTREMITY VENOUS DOPPLER ULTRASOUND TECHNIQUE: Gray-scale sonography with graded compression, as well as color Doppler and duplex ultrasound were performed  to evaluate the upper extremity deep venous system from the level of the subclavian vein and including the jugular, axillary, basilic, radial, ulnar and upper cephalic vein. Spectral Doppler was utilized to evaluate flow at rest and with distal augmentation  maneuvers. COMPARISON:  05/13/2016 FINDINGS: Contralateral Subclavian Vein: Respiratory phasicity is normal and symmetric with the symptomatic side. No evidence of thrombus. Normal compressibility. Internal Jugular Vein: No evidence of thrombus. Normal compressibility, respiratory phasicity and response to augmentation. Subclavian Vein: No evidence of thrombus. Normal compressibility, respiratory phasicity and response to augmentation. Axillary Vein: No evidence of thrombus. Normal compressibility, respiratory phasicity and response to augmentation. Cephalic Vein: No evidence of thrombus. Normal compressibility, respiratory phasicity and response to augmentation. Basilic Vein: Stable evidence of echogenic thrombus in a nondistended basilic vein. Overall appearance is more suggestive of chronic thrombus and may relate to prior catheter placement such as a PICC line. Given history of end-stage renal disease, this could also relate to chronic occlusion post prior surgical dialysis access. Brachial Veins: No evidence of thrombus. Normal compressibility, respiratory phasicity and response to augmentation. Radial Veins: No evidence of thrombus. Normal compressibility, respiratory phasicity and response to augmentation. Ulnar Veins: No evidence of thrombus. Normal compressibility, respiratory phasicity and response to augmentation. Venous Reflux:  None visualized. Other Findings:  None visualized. IMPRESSION: Stable appearance of thrombus in the right basilic vein. Based on sonographic appearance, this is favored to more likely represent chronic thrombus rather than acute thrombus. This could relate to prior catheter placement or dialysis access. Electronically Signed    By: Aletta Edouard M.D.   On: 05/18/2016 16:35   Dg Chest Port 1 View  Result Date: 05/18/2016 CLINICAL DATA:  75 year old female currently intubated with respiratory failure EXAM: PORTABLE CHEST 1 VIEW COMPARISON:  Prior chest x-ray 05/17/2016 FINDINGS: The tip the endotracheal tube is 4 cm above the carina. A right IJ tunneled PICC is present. The tip of the catheter overlies the distal SVC. A gastric tube is also present. The tip lies below the diaphragm and field-of-view, presumably within the stomach. Patient is status post median sternotomy with evidence of prior multivessel CABG. The cardiac silhouette and mediastinal contours remain unchanged. Atherosclerotic calcifications again noted in the transverse aorta. No significant pulmonary edema, pleural effusion, pneumothorax or focal airspace consolidation. There is likely mild bibasilar subsegmental atelectasis. No acute osseous abnormality. IMPRESSION: 1. Stable and satisfactory support apparatus. 2. No significant interval change in the appearance of the chest. Persistent mild bibasilar atelectasis. Electronically Signed   By: Jacqulynn Cadet M.D.   On: 05/18/2016 09:05    Scheduled Meds: . amiodarone  200 mg Oral Daily  . aspirin EC  81 mg Oral Daily  . chlorhexidine gluconate (MEDLINE KIT)  15 mL Mouth Rinse BID  . famotidine (PEPCID) IV  20 mg Intravenous Q24H  . mouth rinse  15 mL Mouth Rinse QID  . multivitamin  1 tablet Oral Daily  . piperacillin-tazobactam (ZOSYN)  IV  3.375 g Intravenous Q12H  . polyethylene glycol  17 g Per Tube Daily  . vancomycin  500 mg Intravenous Q T,Th,Sa-HD   Continuous Infusions: . feeding supplement (VITAL AF 1.2 CAL) 1,000 mL (05/18/16 1545)  . heparin 800 Units/hr (05/19/16 0834)     LOS: 10 days   Time spent: 25 minutes   Copelan M[place]s[facility] If 7PM-7AM, please contact night-coverage [email or web address] Password [email or web address] 05/19/2016, 9:32 AM

## 2016-05-19 NOTE — Care Management Note (Signed)
Case Management Note  Patient Details  Name: Shawna [name] MRN: [id number] Date of Birth: 1940/12/03  If discussed at Long Length of Stay Meetings, dates discussed:  05/19/2016    Sherald Barge, RN 05/19/2016, 10:49 AM

## 2016-05-19 NOTE — Progress Notes (Signed)
**Note De-Identified  Obfuscation** ETT advanced 3cm; current placement 21 cm at lips.

## 2016-05-20 DIAGNOSIS — G729 Myopathy, unspecified: Secondary | ICD-10-CM | POA: Diagnosis not present

## 2016-05-20 DIAGNOSIS — G9340 Encephalopathy, unspecified: Secondary | ICD-10-CM | POA: Diagnosis not present

## 2016-05-20 LAB — BLOOD GAS, ARTERIAL
ALLENS TEST (PASS/FAIL): POSITIVE — AB
Acid-Base Excess: 2.4 mmol/L — ABNORMAL HIGH (ref 0.0–2.0)
Bicarbonate: 26.5 mmol/L (ref 20.0–28.0)
DRAWN BY: 382351
FIO2: 30
MECHVT: 380 mL
O2 SAT: 96.8 %
PEEP: 5 cmH2O
PH ART: 7.418 (ref 7.350–7.450)
Patient temperature: 37
RATE: 14 resp/min
pCO2 arterial: 41.9 mmHg (ref 32.0–48.0)
pO2, Arterial: 112 mmHg — ABNORMAL HIGH (ref 83.0–108.0)

## 2016-05-20 LAB — CBC
HEMATOCRIT: 27.6 % — AB (ref 36.0–46.0)
Hemoglobin: 8.4 g/dL — ABNORMAL LOW (ref 12.0–15.0)
MCH: 30.5 pg (ref 26.0–34.0)
MCHC: 30.4 g/dL (ref 30.0–36.0)
MCV: 100.4 fL — ABNORMAL HIGH (ref 78.0–100.0)
Platelets: 368 10*3/uL (ref 150–400)
RBC: 2.75 MIL/uL — ABNORMAL LOW (ref 3.87–5.11)
RDW: 16.7 % — AB (ref 11.5–15.5)
WBC: 18.2 10*3/uL — AB (ref 4.0–10.5)

## 2016-05-20 LAB — RENAL FUNCTION PANEL
ALBUMIN: 2.9 g/dL — AB (ref 3.5–5.0)
Anion gap: 15 (ref 5–15)
BUN: 64 mg/dL — AB (ref 6–20)
CO2: 26 mmol/L (ref 22–32)
CREATININE: 8.74 mg/dL — AB (ref 0.44–1.00)
Calcium: 9.4 mg/dL (ref 8.9–10.3)
Chloride: 100 mmol/L — ABNORMAL LOW (ref 101–111)
GFR calc Af Amer: 4 mL/min — ABNORMAL LOW (ref >60)
GFR, EST NON AFRICAN AMERICAN: 4 mL/min — AB (ref >60)
Glucose, Bld: 164 mg/dL — ABNORMAL HIGH (ref 65–99)
PHOSPHORUS: 5.1 mg/dL — AB (ref 2.5–4.6)
POTASSIUM: 3.5 mmol/L (ref 3.5–5.1)
Sodium: 141 mmol/L (ref 135–145)

## 2016-05-20 LAB — GLUCOSE, CAPILLARY
GLUCOSE-CAPILLARY: 107 mg/dL — AB (ref 65–99)
GLUCOSE-CAPILLARY: 125 mg/dL — AB (ref 65–99)
GLUCOSE-CAPILLARY: 145 mg/dL — AB (ref 65–99)
Glucose-Capillary: 147 mg/dL — ABNORMAL HIGH (ref 65–99)
Glucose-Capillary: 136 mg/dL — ABNORMAL HIGH (ref 65–99)
Glucose-Capillary: 113 mg/dL — ABNORMAL HIGH (ref 65–99)

## 2016-05-20 LAB — HEPARIN LEVEL (UNFRACTIONATED)
HEPARIN UNFRACTIONATED: 0.24 [IU]/mL — AB (ref 0.30–0.70)
Heparin Unfractionated: 0.35 IU/mL (ref 0.30–0.70)

## 2016-05-20 LAB — RPR: RPR Ser Ql: NONREACTIVE

## 2016-05-20 LAB — HOMOCYSTEINE: HOMOCYSTEINE-NORM: 9.5 umol/L (ref 0.0–15.0)

## 2016-05-20 LAB — HIV ANTIBODY (ROUTINE TESTING W REFLEX): HIV SCREEN 4TH GENERATION: NONREACTIVE

## 2016-05-20 MED ORDER — CLONIDINE HCL 0.1 MG PO TABS
0.1000 mg | ORAL_TABLET | Freq: Two times a day (BID) | ORAL | Status: DC
  Administered 2016-05-23 – 2016-05-25 (×4): 0.1 mg via ORAL
  Filled 2016-05-20 (×4): qty 1

## 2016-05-20 MED ORDER — CLONIDINE HCL 0.1 MG PO TABS: 0.1000 mg | ORAL_TABLET | Freq: Two times a day (BID) | ORAL | Status: DC

## 2016-05-20 MED ORDER — HYDRALAZINE HCL 20 MG/ML IJ SOLN: 5.0000 mg | Freq: Once | INTRAMUSCULAR | Status: DC

## 2016-05-20 MED ORDER — EPOETIN ALFA 20000 UNIT/ML IJ SOLN
14000.0000 [IU] | INTRAMUSCULAR | Status: DC
  Administered 2016-05-21: 14000 [IU] via INTRAVENOUS

## 2016-05-20 NOTE — Procedures (Signed)
**Note De-Identified Grason Brailsford Obfuscation** Extubation Procedure Note  Patient Details:   Name: Shawna [name] DOB: 1941/01/31 MRN: [id number]   Airway Documentation:     Evaluation  O2 sats: stable throughout Complications: No apparent complications Patient did tolerate procedure well. Bilateral Breath Sounds: Diminished   Yes + leak, VC and NIF WNL Ricard Faulkner, Penni Bombard 05/20/2016, 11:01 AM

## 2016-05-20 NOTE — Care Management Important Message (Signed)
Important Message  Patient Details  Name: Shawna [name] MRN: [id number] Date of Birth: 1940/12/13   Medicare Important Message Given:  Yes    Sherald Barge, RN 05/20/2016, 8:56 AM

## 2016-05-20 NOTE — Progress Notes (Signed)
Shawna A. Merlene Laughter, MD     [email or web address]          Shawna [name] is an 75 y.o. female.   Assessment/Plan: Global weakness with difficulty weaning. She has been extubated now. Concerns for possible critical illness myopathy but she seems to have improved. Limited neuromuscular workup labs are pending.     The patient has been extubated. She appears to have global weakness and mild dyspnea. The family reports that she has difficulty speaking due to the intubation.     GENERAL: The patient appears to be in moderate discomfort but in no acute distress. She is intubated and was just given sedatives.  HEENT: Supple. Atraumatic normocephalic.   ABDOMEN: soft  EXTREMITIES: No edema   BACK: Normal.  SKIN: Normal by inspection.    MENTAL STATUS: She is awake and alert and does try to speak but states seemed to have difficulty with the her voice due to the intubation. She does follow commands bilaterally more consistently on the right side.  CRANIAL NERVES: Pupils are equal, round and reactive to light; extra ocular movements are full, there is no significant nystagmus; upper and lower facial muscles are normal in strength and symmetric, there is no flattening of the nasolabial folds.  MOTOR: She moves upper extremities equally against gravity. She moves less today both sides 2/5.  COORDINATION: No tremors or dysmetria is observed.  REFLEXES: Deep tendon reflexes are symmetrical and normal. Babinski reflexes are flexor bilaterally.   SENSATION: She responds to painful stimuli bilaterally.    Objective: Vital signs in last 24 hours: Temp:  [96.7 F (35.9 C)-98.3 F (36.8 C)] 98.1 F (36.7 C) (11/10 1702) Pulse Rate:  [84-95] 95 (11/10 1102) Resp:  [14-28] 28 (11/10 1102) BP: (111-178)/(52-127) 147/127 (11/10 1102) SpO2:  [98 %-100 %] 100 % (11/10 1102) FiO2 (%):  [30 %] 30 % (11/10 1029) Weight:  [121 lb 4.1 oz (55 kg)] 121 lb  4.1 oz (55 kg) (11/10 0500)  Intake/Output from previous day: 11/09 0701 - 11/10 0700 In: 1768 [I.V.:208; NG/GT:1360; IV Piggyback:200] Out: 410  Intake/Output this shift: No intake/output data recorded. Nutritional status: Diet NPO time specified   Lab Results: Results for orders placed or performed during the hospital encounter of 05/08/16 (from the past 48 hour(s))  Glucose, capillary     Status: Abnormal   Collection Time: 05/19/16 12:51 AM  Result Value Ref Range   Glucose-Capillary 134 (H) 65 - 99 mg/dL  Heparin level (unfractionated)     Status: None   Collection Time: 05/19/16  4:23 AM  Result Value Ref Range   Heparin Unfractionated 0.48 0.30 - 0.70 IU/mL    Comment:        IF HEPARIN RESULTS ARE BELOW EXPECTED VALUES, AND PATIENT DOSAGE HAS BEEN CONFIRMED, SUGGEST FOLLOW UP TESTING OF ANTITHROMBIN III LEVELS.   CBC     Status: Abnormal   Collection Time: 05/19/16  4:23 AM  Result Value Ref Range   WBC 19.0 (H) 4.0 - 10.5 K/uL   RBC 3.03 (L) 3.87 - 5.11 MIL/uL   Hemoglobin 9.5 (L) 12.0 - 15.0 g/dL   HCT 29.9 (L) 36.0 - 46.0 %   MCV 98.7 78.0 - 100.0 fL   MCH 31.4 26.0 - 34.0 pg   MCHC 31.8 30.0 - 36.0 g/dL   RDW 16.8 (H) 11.5 - 15.5 %   Platelets 404 (H) 150 - 400 K/uL    Comment: SPECIMEN CHECKED FOR CLOTS  Comprehensive metabolic  panel     Status: Abnormal   Collection Time: 05/19/16  4:23 AM  Result Value Ref Range   Sodium 137 135 - 145 mmol/L   Potassium 4.1 3.5 - 5.1 mmol/L   Chloride 95 (L) 101 - 111 mmol/L   CO2 25 22 - 32 mmol/L   Glucose, Bld 181 (H) 65 - 99 mg/dL   BUN 70 (H) 6 - 20 mg/dL   Creatinine, Ser 9.94 (H) 0.44 - 1.00 mg/dL   Calcium 8.9 8.9 - 10.3 mg/dL   Total Protein 6.4 (L) 6.5 - 8.1 g/dL   Albumin 2.8 (L) 3.5 - 5.0 g/dL   AST 36 15 - 41 U/L   ALT 178 (H) 14 - 54 U/L   Alkaline Phosphatase 75 38 - 126 U/L   Total Bilirubin 0.9 0.3 - 1.2 mg/dL   GFR calc non Af Amer 3 (L) >60 mL/min   GFR calc Af Amer 4 (L) >60 mL/min     Comment: (NOTE) The eGFR has been calculated using the CKD EPI equation. This calculation has not been validated in all clinical situations. eGFR's persistently <60 mL/min signify possible Chronic Kidney Disease.    Anion gap 17 (H) 5 - 15  Renal function panel     Status: Abnormal   Collection Time: 05/19/16  4:23 AM  Result Value Ref Range   Sodium 138 135 - 145 mmol/L   Potassium 4.1 3.5 - 5.1 mmol/L   Chloride 96 (L) 101 - 111 mmol/L   CO2 26 22 - 32 mmol/L   Glucose, Bld 181 (H) 65 - 99 mg/dL   BUN 71 (H) 6 - 20 mg/dL   Creatinine, Ser 10.20 (H) 0.44 - 1.00 mg/dL   Calcium 9.0 8.9 - 10.3 mg/dL   Phosphorus 8.0 (H) 2.5 - 4.6 mg/dL   Albumin 2.8 (L) 3.5 - 5.0 g/dL   GFR calc non Af Amer 3 (L) >60 mL/min   GFR calc Af Amer 4 (L) >60 mL/min    Comment: (NOTE) The eGFR has been calculated using the CKD EPI equation. This calculation has not been validated in all clinical situations. eGFR's persistently <60 mL/min signify possible Chronic Kidney Disease.    Anion gap 16 (H) 5 - 15  Glucose, capillary     Status: Abnormal   Collection Time: 05/19/16  5:14 AM  Result Value Ref Range   Glucose-Capillary 122 (H) 65 - 99 mg/dL  Blood gas, arterial     Status: Abnormal   Collection Time: 05/19/16  5:17 AM  Result Value Ref Range   FIO2 30.00    Delivery systems VENTILATOR    Mode PRESSURE REGULATED VOLUME CONTROL    VT 380 mL   LHR 14 resp/min   Peep/cpap 5.0 cm H20   pH, Arterial 7.329 (L) 7.350 - 7.450   pCO2 arterial 46.9 32.0 - 48.0 mmHg   pO2, Arterial 97.3 83.0 - 108.0 mmHg   Bicarbonate 23.0 20.0 - 28.0 mmol/L   Acid-base deficit 1.1 0.0 - 2.0 mmol/L   O2 Saturation 94.6 %   Patient temperature 37.0    Collection site RIGHT RADIAL    Drawn by [id number]    Sample type ARTERIAL DRAW    Allens test (pass/fail) POSITIVE (A) PASS  Glucose, capillary     Status: Abnormal   Collection Time: 05/19/16  7:42 AM  Result Value Ref Range   Glucose-Capillary 170 (H) 65 - 99  mg/dL   Comment 1 Notify RN  Comment 2 Document in Chart   Vitamin B12     Status: Abnormal   Collection Time: 05/19/16 11:13 AM  Result Value Ref Range   Vitamin B-12 1,079 (H) 180 - 914 pg/mL    Comment: (NOTE) This assay is not validated for testing neonatal or myeloproliferative syndrome specimens for Vitamin B12 levels. Performed at [facility]   Homocysteine     Status: None   Collection Time: 05/19/16 11:13 AM  Result Value Ref Range   Homocysteine 9.5 0.0 - 15.0 umol/L    Comment: (NOTE) Performed At: [facility] [place], [place] [id number] Lindon Romp MD [id number]   TSH     Status: None   Collection Time: 05/19/16 11:13 AM  Result Value Ref Range   TSH 1.536 0.350 - 4.500 uIU/mL    Comment: Performed by a 3rd Generation assay with a functional sensitivity of <=0.01 uIU/mL.  RPR     Status: None   Collection Time: 05/19/16 11:13 AM  Result Value Ref Range   RPR Ser Ql Non Reactive Non Reactive    Comment: (NOTE) Performed At: [facility] [place], [place] [id number] Lindon Romp MD TU:[id number]   Sedimentation rate     Status: Abnormal   Collection Time: 05/19/16 11:13 AM  Result Value Ref Range   Sed Rate 99 (H) 0 - 22 mm/hr  C-reactive protein     Status: Abnormal   Collection Time: 05/19/16 11:13 AM  Result Value Ref Range   CRP 14.2 (H) <1.0 mg/dL    Comment: Performed at [facility]  HIV antibody (routine testing) (NOT for [facility])     Status: None   Collection Time: 05/19/16 11:13 AM  Result Value Ref Range   HIV Screen 4th Generation wRfx Non Reactive Non Reactive    Comment: (NOTE) Performed At: [facility] [place], [place] [id number] Lindon Romp MD [id number]   CK total and CKMB (cardiac)not at [facility]     Status: Abnormal   Collection Time: 05/19/16 11:13 AM  Result Value Ref Range   Total CK 33 (L) 38 - 234 U/L   CK, MB 1.5 0.5 - 5.0  ng/mL   Relative Index RELATIVE INDEX IS INVALID 0.0 - 2.5    Comment: WHEN CK < 100 U/L        Performed at [facility]   Glucose, capillary     Status: Abnormal   Collection Time: 05/19/16  4:45 PM  Result Value Ref Range   Glucose-Capillary 122 (H) 65 - 99 mg/dL   Comment 1 Notify RN    Comment 2 Document in Chart   Glucose, capillary     Status: Abnormal   Collection Time: 05/19/16  7:24 PM  Result Value Ref Range   Glucose-Capillary 112 (H) 65 - 99 mg/dL  Glucose, capillary     Status: Abnormal   Collection Time: 05/20/16 12:18 AM  Result Value Ref Range   Glucose-Capillary 107 (H) 65 - 99 mg/dL  Heparin level (unfractionated)     Status: Abnormal   Collection Time: 05/20/16  4:20 AM  Result Value Ref Range   Heparin Unfractionated 0.24 (L) 0.30 - 0.70 IU/mL    Comment:        IF HEPARIN RESULTS ARE BELOW EXPECTED VALUES, AND PATIENT DOSAGE HAS BEEN CONFIRMED, SUGGEST FOLLOW UP TESTING OF ANTITHROMBIN III LEVELS.   CBC     Status: Abnormal   Collection  Time: 05/20/16  4:20 AM  Result Value Ref Range   WBC 18.2 (H) 4.0 - 10.5 K/uL   RBC 2.75 (L) 3.87 - 5.11 MIL/uL   Hemoglobin 8.4 (L) 12.0 - 15.0 g/dL   HCT 27.6 (L) 36.0 - 46.0 %   MCV 100.4 (H) 78.0 - 100.0 fL   MCH 30.5 26.0 - 34.0 pg   MCHC 30.4 30.0 - 36.0 g/dL   RDW 16.7 (H) 11.5 - 15.5 %   Platelets 368 150 - 400 K/uL  Renal function panel     Status: Abnormal   Collection Time: 05/20/16  4:20 AM  Result Value Ref Range   Sodium 141 135 - 145 mmol/L   Potassium 3.5 3.5 - 5.1 mmol/L   Chloride 100 (L) 101 - 111 mmol/L   CO2 26 22 - 32 mmol/L   Glucose, Bld 164 (H) 65 - 99 mg/dL   BUN 64 (H) 6 - 20 mg/dL   Creatinine, Ser 8.74 (H) 0.44 - 1.00 mg/dL   Calcium 9.4 8.9 - 10.3 mg/dL   Phosphorus 5.1 (H) 2.5 - 4.6 mg/dL   Albumin 2.9 (L) 3.5 - 5.0 g/dL   GFR calc non Af Amer 4 (L) >60 mL/min   GFR calc Af Amer 4 (L) >60 mL/min    Comment: (NOTE) The eGFR has been calculated using the CKD EPI  equation. This calculation has not been validated in all clinical situations. eGFR's persistently <60 mL/min signify possible Chronic Kidney Disease.    Anion gap 15 5 - 15  Glucose, capillary     Status: Abnormal   Collection Time: 05/20/16  5:07 AM  Result Value Ref Range   Glucose-Capillary 147 (H) 65 - 99 mg/dL  Blood gas, arterial     Status: Abnormal   Collection Time: 05/20/16  6:18 AM  Result Value Ref Range   FIO2 30.00    Delivery systems VENTILATOR    Mode PRESSURE REGULATED VOLUME CONTROL    VT 380 mL   LHR 14 resp/min   Peep/cpap 5.0 cm H20   pH, Arterial 7.418 7.350 - 7.450   pCO2 arterial 41.9 32.0 - 48.0 mmHg   pO2, Arterial 112 (H) 83.0 - 108.0 mmHg   Bicarbonate 26.5 20.0 - 28.0 mmol/L   Acid-Base Excess 2.4 (H) 0.0 - 2.0 mmol/L   O2 Saturation 96.8 %   Patient temperature 37.0    Collection site RIGHT RADIAL    Drawn by [id number]    Sample type ARTERIAL DRAW    Allens test (pass/fail) POSITIVE (A) PASS  Glucose, capillary     Status: Abnormal   Collection Time: 05/20/16  7:36 AM  Result Value Ref Range   Glucose-Capillary 145 (H) 65 - 99 mg/dL   Comment 1 Notify RN    Comment 2 Document in Chart   Glucose, capillary     Status: Abnormal   Collection Time: 05/20/16 12:22 PM  Result Value Ref Range   Glucose-Capillary 136 (H) 65 - 99 mg/dL   Comment 1 Notify RN    Comment 2 Document in Chart   Heparin level (unfractionated)     Status: None   Collection Time: 05/20/16  4:25 PM  Result Value Ref Range   Heparin Unfractionated 0.35 0.30 - 0.70 IU/mL    Comment:        IF HEPARIN RESULTS ARE BELOW EXPECTED VALUES, AND PATIENT DOSAGE HAS BEEN CONFIRMED, SUGGEST FOLLOW UP TESTING OF ANTITHROMBIN III LEVELS.   Glucose, capillary  Status: Abnormal   Collection Time: 05/20/16  4:50 PM  Result Value Ref Range   Glucose-Capillary 125 (H) 65 - 99 mg/dL   Comment 1 Notify RN    Comment 2 Document in Chart     Lipid Panel No results for input(s):  CHOL, TRIG, HDL, CHOLHDL, VLDL, LDLCALC in the last 72 hours.  Studies/Results:   HEAD CT Brain: There is no evidence of acute cortical infarct, intracranial hemorrhage, mass, midline shift, or extra-axial fluid collection. Mild cerebral atrophy is unchanged. Confluent cerebral white matter hypodensities are similar to the prior study and compatible with extensive chronic small vessel ischemic disease. There are chronic lacunar infarcts in the basal ganglia.  Vascular: Calcified atherosclerosis at the skullbase. No hyperdense vessel.  Skull: No fracture or focal osseous lesion.  Sinuses/Orbits: Visualized paranasal sinuses and mastoid air cells are clear. Visualized orbits are grossly unremarkable.  Other: None.  IMPRESSION: 1. No evidence of acute intracranial abnormality. 2. Extensive chronic small vessel ischemic disease.      Medications:  Scheduled Meds: . amiodarone  200 mg Oral Daily  . aspirin EC  81 mg Oral Daily  . chlorhexidine gluconate (MEDLINE KIT)  15 mL Mouth Rinse BID  . cloNIDine  0.1 mg Oral BID  . [START ON 05/21/2016] epoetin (EPOGEN/PROCRIT) injection  14,000 Units Intravenous Q T,Th,Sa-HD  . famotidine (PEPCID) IV  20 mg Intravenous Q24H  . mouth rinse  15 mL Mouth Rinse QID  . multivitamin  1 tablet Oral Daily  . piperacillin-tazobactam (ZOSYN)  IV  3.375 g Intravenous Q12H  . polyethylene glycol  17 g Per Tube Daily  . vancomycin  500 mg Intravenous Q T,Th,Sa-HD   Continuous Infusions: . heparin 900 Units/hr (05/20/16 1027)   PRN Meds:.sodium chloride, sodium chloride, sodium chloride, sodium chloride, sodium chloride, sodium chloride, sodium chloride, sodium chloride, acetaminophen, acetaminophen, ALPRAZolam, alteplase, alteplase, fentaNYL (SUBLIMAZE) injection, fentaNYL (SUBLIMAZE) injection, fluticasone, heparin, heparin, ipratropium-albuterol, lidocaine (PF), lidocaine-prilocaine, midazolam, ondansetron (ZOFRAN) IV,  pentafluoroprop-tetrafluoroeth     LOS: 11 days   Leo Weyandt A. Merlene [name], M.D.  Diplomate, Tax adviser of Psychiatry and Neurology ( Neurology).

## 2016-05-20 NOTE — Progress Notes (Signed)
Pharmacy Antibiotic Note  Shawna [name] is a 75 y.o. female admitted on 05/08/2016 with cellulitis.  Pharmacy has been consulted for Vancomycin and Zosyn dosing. Cultures  are negative to date  Plan: Vancomycin 500mg  IV every HD. Zosyn 3.375gm IV every 12 hours.   Height: 5\' 1"  (154.9 cm) Weight: 121 lb 4.1 oz (55 kg) IBW/kg (Calculated) : 47.8  Temp (24hrs), Avg:97.9 F (36.6 C), Min:96.7 F (35.9 C), Max:98.4 F (36.9 C)   Recent Labs Lab 05/17/16 0437 05/17/16 2251 05/18/16 0139 05/18/16 1835 05/19/16 0423 05/20/16 0420  WBC 11.0*  --  10.4 15.8* 19.0* 18.2*  CREATININE 12.31*  --  7.28* 9.13* 9.94*  10.20* 8.74*  LATICACIDVEN  --  1.2 1.1  --   --   --     Estimated Creatinine Clearance: 4.1 mL/min (by C-G formula based on SCr of 8.74 mg/dL (H)).    Allergies  Allergen Reactions  . Aspirin Other (See Comments)    Mess up her stomach; "makes my bowels have blood in them". Takes 81 mg EC Aspirin   . Contrast Media [Iodinated Diagnostic Agents] Itching  . Iron Itching and Other (See Comments)    "they gave me iron in dialysis; had to give me Benadryl cause I had to have the iron" (05/02/2012)  . Penicillins Other (See Comments)    "makes me real weak when I take it; like I'll pass out"Has patient had a PCN reaction causing immediate rash, facial/tongue/throat swelling, SOB or lightheadedness with hypotension: Nono Has patient had a PCN reaction causing severe rash involving mucus membranes or skin necrosis: Nono Has patient had a PCN reaction that required hospitalization Nono Has patient had a PCN reaction occurring within the last 10 years: Nono If all of the above  . Plavix [Clopidogrel Bisulfate] Rash  . Amlodipine Swelling    Leg swelling  . Bactrim [Sulfamethoxazole-Trimethoprim] Rash  . Dexilant [Dexlansoprazole] Other (See Comments)    Upset stomach  . Morphine And Related Itching    Itching in feet  . Nitrofurantoin Hives  . Prilosec [Omeprazole]  Other (See Comments)    "back spasms"  . Venofer [Ferric Oxide] Itching    Patient reports using Benadryl prior to doses as [facility]  . Levaquin [Levofloxacin In D5w] Rash  . Protonix [Pantoprazole Sodium] Rash    Antimicrobials this admission: Zosyn 11/7 >>  Vanc 11/7 >>   Dose adjustments this admission: ESRD dosing  Microbiology results: 11/7 BCx: pending 10/30 MRSA PCR: (-)  Thank you for allowing pharmacy to be a part of this patient's care.  Excell Seltzer Poteet 05/20/2016 8:28 AM

## 2016-05-20 NOTE — Progress Notes (Signed)
PROGRESS NOTE    Shawna [name]  IRC:[id number] DOB: [birth year] DOA: 05/08/2016 PCP: [name], MD    Brief Narrative:  6 yof with a history of CAD and ESRD, presented with acute onset of chest pain unrelieved by aspirin and nitroglycerin. In the emergency department BP 230/130 and she was noted to have new ST depression. She was started on nitroglycerin infusion and IV labetalol and admitted for hypertensive crisis with chest pain.Her chest pain had resolved but she remained hypertensive. On 11/1 patient suddenly became bradycardic and unresponsive. She underwent CPR, chest compressions, received epinephrine and had ROSC. On 11/2 prior to starting her dialysis she was noted to be hypertensive so she was given IV labetalol and developed bradycardia with subsequent PEA arrest. She underwent CPR again for < 61mns and had ROSC. She currently remains on ventilator. Weaning has been difficult due to periods of apnea. Pulmonology and Neurology assisting.  Assessment & Plan:   Principal Problem:   Hypertensive crisis Active Problems:   Coronary atherosclerosis of native coronary artery   ESRD on hemodialysis (HCC)   Anemia of chronic kidney failure   Chronic diastolic CHF (congestive heart failure) (HCC)   Chest pain   [place]   Hypertensive crisis without congestive heart failure   Cardiac arrest ([facility]   Palliative care encounter   DNR (do not resuscitate) discussion   Goals of care, counseling/discussion  1. Bradycardia with PEA cardiac arrest. On 11/1, patient's heart rate became bradycardic into PEA. She underwent CPR, received epinephrine, bicarbonate and calcium chloride. She had ROSC after approximately 25 mins of CPR. Lab work had indicated an elevated potassium of 6.4, which may have been cause. She was brieflystarted on a epinephrine infusion which was quicklyweaned off. CXR did not show any acute process. Cardiology following. Resolved 2. Acute  respiratory failure: Ventilator dependent. Patient intubated while undergoing CPR. Pulmonology following. She is undergoing daily weaning trials. She remains hemodynamically stable. Weaning has been unsuccessful due to intermittent periods of apnea. Neurology on board. Pulmonology to try weaning process today 3. Hypertensive crisis with chest pain.Chest pain resolved. Cards has been on board. HTN crisis resolved 4. Elevated troponin. Patient has a history of CAD status post CABG and stenting. Patient had no evidence of ACS on admission. Since that time, her troponin has started to rise. Cardiology following and felt that this may be demand ischemia/related to CPR. Troponin peak was 1.5 and has since trended down since.  5. ESRD on HD MWF. Nephrology is following for dialysis needs.  6. Chronic diastolic congestive heart failure. Patient has an echocardiogram on 09/2015 with an EF of 651-02% grade 2 diastolic dysfunction. Continue fluid management with dialysis. 7. PSVT, maintained on amiodarone. EKG remains in sinus rhythm.  8. Myoclonus. Suspect secondary to CKD/ESRD on HD. No history or objective findings to suggest acute CNS process. Reviewed meds, no likely offenders. Neurology on board. 9. Hyperkalemia. Resolved.  10. Elevated TSH. Recheck as an outpatient. 11. Anemia of chronic kidney disease. Hemoglobin currently stable. Will continue to monitor. 12. Right hand swelling. Started on IV antibiotics due to concern of cellulitis. Xray of hand does not show any gas. Venous dopplers reporting stable appearance of thrombus in the right basilic vein. on IV heparin  DVT prophylaxis: Heparin infusion Code Status: FULL  Family Communication: husband at bedside Disposition Plan: pending clinical improvement    Consultants:   Nephrology   Cardiology  Pulmonology  Nutrition   Neurology  Procedures:   Intubation  11/1>>  Antimicrobials  Vancomycin 11/7>>  Zosyn  11/7>>   Subjective: Intubated and sedated but more alert today  Objective: Vitals:   05/20/16 0903 05/20/16 1029 05/20/16 1102 05/20/16 1232  BP:   (!) 147/127   Pulse:   95   Resp:   (!) 28   Temp:    97.1 F (36.2 C)  TempSrc:    Axillary  SpO2: 100% 100% 100%   Weight:      Height:        Intake/Output Summary (Last 24 hours) at 05/20/16 1255 Last data filed at 05/20/16 1130  Gross per 24 hour  Intake          1956.43 ml  Output              410 ml  Net          1546.43 ml   Filed Weights   05/19/16 0500 05/19/16 1130 05/20/16 0500  Weight: 55.5 kg (122 lb 5.7 oz) 55.5 kg (122 lb 5.7 oz) 55 kg (121 lb 4.1 oz)    Examination:  General exam: Appears calm and comfortable, in nad. Respiratory system: Clear to auscultation. Respiratory effort normal. Cardiovascular system: S1 & S2 heard, RRR. No JVD, murmurs, rubs, gallops or clicks. No pedal edema. Gastrointestinal system: Abdomen is nondistended, soft and nontender. No organomegaly or masses felt. Normal bowel sounds heard. Central nervous system: somnolent, reacts to noxious stimuli Extremities: right hand is edematous. Pulses are palpable. Erythema and warmth noted over hand. Skin: No rashes, lesions or ulcers Psychiatry: cannot assess    Data Reviewed: I have personally reviewed following labs and imaging studies  CBC:  Recent Labs Lab 05/17/16 0437 05/18/16 0139 05/18/16 1835 05/19/16 0423 05/20/16 0420  WBC 11.0* 10.4 15.8* 19.0* 18.2*  HGB 10.5* 9.4* 9.7* 9.5* 8.4*  HCT 33.4* 30.2* 30.8* 29.9* 27.6*  MCV 98.8 99.3 98.4 98.7 100.4*  PLT 355 338 392 404* 094   Basic Metabolic Panel:  Recent Labs Lab 05/14/16 0408  05/17/16 0437 05/18/16 0139 05/18/16 1835 05/19/16 0423 05/20/16 0420  NA 140  < > 142 137 138 137  138 141  K 4.4  < > 4.4 3.6 3.9 4.1  4.1 3.5  CL 96*  < > 96* 97* 96* 95*  96* 100*  CO2 26  < > 23 26 26 25  26 26   GLUCOSE 108*  < > 85 170* 146* 181*  181* 164*  BUN 70*   < > 97* 43* 60* 70*  71* 64*  CREATININE 9.58*  < > 12.31* 7.28* 9.13* 9.94*  10.20* 8.74*  CALCIUM 8.6*  < > 9.4 9.0 9.2 8.9  9.0 9.4  PHOS 7.3*  --   --   --  8.2* 8.0* 5.1*  < > = values in this interval not displayed. GFR: Estimated Creatinine Clearance: 4.1 mL/min (by C-G formula based on SCr of 8.74 mg/dL (H)). Liver Function Tests:  Recent Labs Lab 05/14/16 0408 05/18/16 1835 05/19/16 0423 05/20/16 0420  AST  --   --  36  --   ALT  --   --  178*  --   ALKPHOS  --   --  75  --   BILITOT  --   --  0.9  --   PROT  --   --  6.4*  --   ALBUMIN 3.0* 3.0* 2.8*  2.8* 2.9*   No results for input(s): LIPASE, AMYLASE in the last 168 hours. No results  for input(s): AMMONIA in the last 168 hours. Coagulation Profile: No results for input(s): INR, PROTIME in the last 168 hours. Cardiac Enzymes:  Recent Labs Lab 05/19/16 1113  CKTOTAL 33*  CKMB 1.5   BNP (last 3 results) No results for input(s): PROBNP in the last 8760 hours. HbA1C: No results for input(s): HGBA1C in the last 72 hours. CBG:  Recent Labs Lab 05/19/16 1924 05/20/16 0018 05/20/16 0507 05/20/16 0736 05/20/16 1222  GLUCAP 112* 107* 147* 145* 136*   Lipid Profile: No results for input(s): CHOL, HDL, LDLCALC, TRIG, CHOLHDL, LDLDIRECT in the last 72 hours. Thyroid Function Tests:  Recent Labs  05/19/16 1113  TSH 1.536   Anemia Panel:  Recent Labs  05/19/16 1113  VITAMINB12 1,079*   Sepsis Labs:  Recent Labs Lab 05/17/16 2251 05/18/16 0139  LATICACIDVEN 1.2 1.1    Recent Results (from the past 240 hour(s))  Culture, blood (routine x 2)     Status: None (Preliminary result)   Collection Time: 05/17/16 10:49 PM  Result Value Ref Range Status   Specimen Description BLOOD PICC  Final   Special Requests BOTTLES DRAWN AEROBIC AND ANAEROBIC 10CC EACH  Final   Culture NO GROWTH 3 DAYS  Final   Report Status PENDING  Incomplete  Culture, blood (routine x 2)     Status: None (Preliminary  result)   Collection Time: 05/17/16 10:51 PM  Result Value Ref Range Status   Specimen Description BLOOD PICC  Final   Special Requests BOTTLES DRAWN AEROBIC AND ANAEROBIC 10CC EACH  Final   Culture NO GROWTH 3 DAYS  Final   Report Status PENDING  Incomplete         Radiology Studies: Ct Head Wo Contrast  Result Date: 05/19/2016 CLINICAL DATA:  Altered mental status. Hypertensive crisis. Cardiac arrest. Unable to wean from ventilator. EXAM: CT HEAD WITHOUT CONTRAST TECHNIQUE: Contiguous axial images were obtained from the base of the skull through the vertex without intravenous contrast. COMPARISON:  03/09/2016 FINDINGS: Motion artifact at the skullbase. Brain: There is no evidence of acute cortical infarct, intracranial hemorrhage, mass, midline shift, or extra-axial fluid collection. Mild cerebral atrophy is unchanged. Confluent cerebral white matter hypodensities are similar to the prior study and compatible with extensive chronic small vessel ischemic disease. There are chronic lacunar infarcts in the basal ganglia. Vascular: Calcified atherosclerosis at the skullbase. No hyperdense vessel. Skull: No fracture or focal osseous lesion. Sinuses/Orbits: Visualized paranasal sinuses and mastoid air cells are clear. Visualized orbits are grossly unremarkable. Other: None. IMPRESSION: 1. No evidence of acute intracranial abnormality. 2. Extensive chronic small vessel ischemic disease. Electronically Signed   By: Logan Bores M.D.   On: 05/19/2016 10:53   US Venous Img Upper Uni Right  Result Date: 05/18/2016 CLINICAL DATA:  Right hand swelling, redness and pain. Recent imaging demonstrating superficial thrombophlebitis within the right basilic vein. EXAM: RIGHT UPPER EXTREMITY VENOUS DOPPLER ULTRASOUND TECHNIQUE: Gray-scale sonography with graded compression, as well as color Doppler and duplex ultrasound were performed to evaluate the upper extremity deep venous system from the level of the  subclavian vein and including the jugular, axillary, basilic, radial, ulnar and upper cephalic vein. Spectral Doppler was utilized to evaluate flow at rest and with distal augmentation maneuvers. COMPARISON:  05/13/2016 FINDINGS: Contralateral Subclavian Vein: Respiratory phasicity is normal and symmetric with the symptomatic side. No evidence of thrombus. Normal compressibility. Internal Jugular Vein: No evidence of thrombus. Normal compressibility, respiratory phasicity and response to augmentation. Subclavian Vein: No evidence  of thrombus. Normal compressibility, respiratory phasicity and response to augmentation. Axillary Vein: No evidence of thrombus. Normal compressibility, respiratory phasicity and response to augmentation. Cephalic Vein: No evidence of thrombus. Normal compressibility, respiratory phasicity and response to augmentation. Basilic Vein: Stable evidence of echogenic thrombus in a nondistended basilic vein. Overall appearance is more suggestive of chronic thrombus and may relate to prior catheter placement such as a PICC line. Given history of end-stage renal disease, this could also relate to chronic occlusion post prior surgical dialysis access. Brachial Veins: No evidence of thrombus. Normal compressibility, respiratory phasicity and response to augmentation. Radial Veins: No evidence of thrombus. Normal compressibility, respiratory phasicity and response to augmentation. Ulnar Veins: No evidence of thrombus. Normal compressibility, respiratory phasicity and response to augmentation. Venous Reflux:  None visualized. Other Findings:  None visualized. IMPRESSION: Stable appearance of thrombus in the right basilic vein. Based on sonographic appearance, this is favored to more likely represent chronic thrombus rather than acute thrombus. This could relate to prior catheter placement or dialysis access. Electronically Signed   By: Aletta Edouard M.D.   On: 05/18/2016 16:35    Scheduled Meds: .  amiodarone  200 mg Oral Daily  . aspirin EC  81 mg Oral Daily  . chlorhexidine gluconate (MEDLINE KIT)  15 mL Mouth Rinse BID  . [START ON 05/21/2016] epoetin (EPOGEN/PROCRIT) injection  14,000 Units Intravenous Q T,Th,Sa-HD  . famotidine (PEPCID) IV  20 mg Intravenous Q24H  . mouth rinse  15 mL Mouth Rinse QID  . multivitamin  1 tablet Oral Daily  . piperacillin-tazobactam (ZOSYN)  IV  3.375 g Intravenous Q12H  . polyethylene glycol  17 g Per Tube Daily  . vancomycin  500 mg Intravenous Q T,Th,Sa-HD   Continuous Infusions: . heparin 900 Units/hr (05/20/16 1027)     LOS: 11 days   Time spent: 25 minutes   [facility] If 7PM-7AM, please contact night-coverage [email or web address] Password [email or web address] 05/20/2016, 12:55 PM

## 2016-05-20 NOTE — Progress Notes (Signed)
Subjective: She remains intubated and on the ventilator. Today she is more awake and she does follow some commands. She was unable to wean again yesterday.  Objective: Vital signs in last 24 hours: Temp:  [96.7 F (35.9 C)-98.4 F (36.9 C)] 96.7 F (35.9 C) (11/10 0400) Pulse Rate:  [84-100] 92 (11/10 0700) Resp:  [14-20] 14 (11/10 0700) BP: (87-178)/(49-79) 125/61 (11/10 0700) SpO2:  [97 %-100 %] 98 % (11/10 0700) FiO2 (%):  [30 %] 30 % (11/10 0420) Weight:  [55 kg (121 lb 4.1 oz)-55.5 kg (122 lb 5.7 oz)] 55 kg (121 lb 4.1 oz) (11/10 0500) Weight change: 0 kg (0 lb) Last BM Date: 05/08/16  Intake/Output from previous day: 11/09 0701 - 11/10 0700 In: 1768 [I.V.:208; NG/GT:1360; IV Piggyback:200] Out: 410   PHYSICAL EXAM General appearance: alert and Intubated on the ventilator but more cooperative and following commands today Resp: rhonchi bilaterally Cardio: regular rate and rhythm, S1, S2 normal, no murmur, click, rub or gallop GI: soft, non-tender; bowel sounds normal; no masses,  no organomegaly Extremities: extremities normal, atraumatic, no cyanosis or edema Pupils react. Mucous membranes are moist. Skin warm and dry  Lab Results:  Results for orders placed or performed during the hospital encounter of 05/08/16 (from the past 48 hour(s))  Glucose, capillary     Status: Abnormal   Collection Time: 05/18/16  7:31 AM  Result Value Ref Range   Glucose-Capillary 101 (H) 65 - 99 mg/dL   Comment 1 Notify RN    Comment 2 Document in Chart   Glucose, capillary     Status: Abnormal   Collection Time: 05/18/16 11:19 AM  Result Value Ref Range   Glucose-Capillary 110 (H) 65 - 99 mg/dL   Comment 1 Notify RN    Comment 2 Document in Chart   Heparin level (unfractionated)     Status: None   Collection Time: 05/18/16  1:54 PM  Result Value Ref Range   Heparin Unfractionated 0.66 0.30 - 0.70 IU/mL    Comment:        IF HEPARIN RESULTS ARE BELOW EXPECTED VALUES, AND  PATIENT DOSAGE HAS BEEN CONFIRMED, SUGGEST FOLLOW UP TESTING OF ANTITHROMBIN III LEVELS.   Glucose, capillary     Status: Abnormal   Collection Time: 05/18/16  4:23 PM  Result Value Ref Range   Glucose-Capillary 107 (H) 65 - 99 mg/dL   Comment 1 Notify RN    Comment 2 Document in Chart   Renal function panel     Status: Abnormal   Collection Time: 05/18/16  6:35 PM  Result Value Ref Range   Sodium 138 135 - 145 mmol/L   Potassium 3.9 3.5 - 5.1 mmol/L   Chloride 96 (L) 101 - 111 mmol/L   CO2 26 22 - 32 mmol/L   Glucose, Bld 146 (H) 65 - 99 mg/dL   BUN 60 (H) 6 - 20 mg/dL   Creatinine, Ser 9.13 (H) 0.44 - 1.00 mg/dL   Calcium 9.2 8.9 - 10.3 mg/dL   Phosphorus 8.2 (H) 2.5 - 4.6 mg/dL   Albumin 3.0 (L) 3.5 - 5.0 g/dL   GFR calc non Af Amer 4 (L) >60 mL/min   GFR calc Af Amer 4 (L) >60 mL/min    Comment: (NOTE) The eGFR has been calculated using the CKD EPI equation. This calculation has not been validated in all clinical situations. eGFR's persistently <60 mL/min signify possible Chronic Kidney Disease.    Anion gap 16 (H) 5 - 15  CBC     Status: Abnormal   Collection Time: 05/18/16  6:35 PM  Result Value Ref Range   WBC 15.8 (H) 4.0 - 10.5 K/uL   RBC 3.13 (L) 3.87 - 5.11 MIL/uL   Hemoglobin 9.7 (L) 12.0 - 15.0 g/dL   HCT 30.8 (L) 36.0 - 46.0 %   MCV 98.4 78.0 - 100.0 fL   MCH 31.0 26.0 - 34.0 pg   MCHC 31.5 30.0 - 36.0 g/dL   RDW 16.8 (H) 11.5 - 15.5 %   Platelets 392 150 - 400 K/uL  Glucose, capillary     Status: Abnormal   Collection Time: 05/18/16  7:21 PM  Result Value Ref Range   Glucose-Capillary 163 (H) 65 - 99 mg/dL  Glucose, capillary     Status: Abnormal   Collection Time: 05/19/16 12:51 AM  Result Value Ref Range   Glucose-Capillary 134 (H) 65 - 99 mg/dL  Heparin level (unfractionated)     Status: None   Collection Time: 05/19/16  4:23 AM  Result Value Ref Range   Heparin Unfractionated 0.48 0.30 - 0.70 IU/mL    Comment:        IF HEPARIN RESULTS ARE  BELOW EXPECTED VALUES, AND PATIENT DOSAGE HAS BEEN CONFIRMED, SUGGEST FOLLOW UP TESTING OF ANTITHROMBIN III LEVELS.   CBC     Status: Abnormal   Collection Time: 05/19/16  4:23 AM  Result Value Ref Range   WBC 19.0 (H) 4.0 - 10.5 K/uL   RBC 3.03 (L) 3.87 - 5.11 MIL/uL   Hemoglobin 9.5 (L) 12.0 - 15.0 g/dL   HCT 29.9 (L) 36.0 - 46.0 %   MCV 98.7 78.0 - 100.0 fL   MCH 31.4 26.0 - 34.0 pg   MCHC 31.8 30.0 - 36.0 g/dL   RDW 16.8 (H) 11.5 - 15.5 %   Platelets 404 (H) 150 - 400 K/uL    Comment: SPECIMEN CHECKED FOR CLOTS  Comprehensive metabolic panel     Status: Abnormal   Collection Time: 05/19/16  4:23 AM  Result Value Ref Range   Sodium 137 135 - 145 mmol/L   Potassium 4.1 3.5 - 5.1 mmol/L   Chloride 95 (L) 101 - 111 mmol/L   CO2 25 22 - 32 mmol/L   Glucose, Bld 181 (H) 65 - 99 mg/dL   BUN 70 (H) 6 - 20 mg/dL   Creatinine, Ser 9.94 (H) 0.44 - 1.00 mg/dL   Calcium 8.9 8.9 - 10.3 mg/dL   Total Protein 6.4 (L) 6.5 - 8.1 g/dL   Albumin 2.8 (L) 3.5 - 5.0 g/dL   AST 36 15 - 41 U/L   ALT 178 (H) 14 - 54 U/L   Alkaline Phosphatase 75 38 - 126 U/L   Total Bilirubin 0.9 0.3 - 1.2 mg/dL   GFR calc non Af Amer 3 (L) >60 mL/min   GFR calc Af Amer 4 (L) >60 mL/min    Comment: (NOTE) The eGFR has been calculated using the CKD EPI equation. This calculation has not been validated in all clinical situations. eGFR's persistently <60 mL/min signify possible Chronic Kidney Disease.    Anion gap 17 (H) 5 - 15  Renal function panel     Status: Abnormal   Collection Time: 05/19/16  4:23 AM  Result Value Ref Range   Sodium 138 135 - 145 mmol/L   Potassium 4.1 3.5 - 5.1 mmol/L   Chloride 96 (L) 101 - 111 mmol/L   CO2 26 22 - 32 mmol/L  Glucose, Bld 181 (H) 65 - 99 mg/dL   BUN 71 (H) 6 - 20 mg/dL   Creatinine, Ser 10.20 (H) 0.44 - 1.00 mg/dL   Calcium 9.0 8.9 - 10.3 mg/dL   Phosphorus 8.0 (H) 2.5 - 4.6 mg/dL   Albumin 2.8 (L) 3.5 - 5.0 g/dL   GFR calc non Af Amer 3 (L) >60 mL/min    GFR calc Af Amer 4 (L) >60 mL/min    Comment: (NOTE) The eGFR has been calculated using the CKD EPI equation. This calculation has not been validated in all clinical situations. eGFR's persistently <60 mL/min signify possible Chronic Kidney Disease.    Anion gap 16 (H) 5 - 15  Glucose, capillary     Status: Abnormal   Collection Time: 05/19/16  5:14 AM  Result Value Ref Range   Glucose-Capillary 122 (H) 65 - 99 mg/dL  Blood gas, arterial     Status: Abnormal   Collection Time: 05/19/16  5:17 AM  Result Value Ref Range   FIO2 30.00    Delivery systems VENTILATOR    Mode PRESSURE REGULATED VOLUME CONTROL    VT 380 mL   LHR 14 resp/min   Peep/cpap 5.0 cm H20   pH, Arterial 7.329 (L) 7.350 - 7.450   pCO2 arterial 46.9 32.0 - 48.0 mmHg   pO2, Arterial 97.3 83.0 - 108.0 mmHg   Bicarbonate 23.0 20.0 - 28.0 mmol/L   Acid-base deficit 1.1 0.0 - 2.0 mmol/L   O2 Saturation 94.6 %   Patient temperature 37.0    Collection site RIGHT RADIAL    Drawn by [id number]    Sample type ARTERIAL DRAW    Allens test (pass/fail) POSITIVE (A) PASS  Glucose, capillary     Status: Abnormal   Collection Time: 05/19/16  7:42 AM  Result Value Ref Range   Glucose-Capillary 170 (H) 65 - 99 mg/dL   Comment 1 Notify RN    Comment 2 Document in Chart   Vitamin B12     Status: Abnormal   Collection Time: 05/19/16 11:13 AM  Result Value Ref Range   Vitamin B-12 1,079 (H) 180 - 914 pg/mL    Comment: (NOTE) This assay is not validated for testing neonatal or myeloproliferative syndrome specimens for Vitamin B12 levels. Performed at [facility]   TSH     Status: None   Collection Time: 05/19/16 11:13 AM  Result Value Ref Range   TSH 1.536 0.350 - 4.500 uIU/mL    Comment: Performed by a 3rd Generation assay with a functional sensitivity of <=0.01 uIU/mL.  RPR     Status: None   Collection Time: 05/19/16 11:13 AM  Result Value Ref Range   RPR Ser Ql Non Reactive Non Reactive    Comment:  (NOTE) Performed At: [facility] [place], [place] [id number] Lindon Romp MD XT:[id number]   Sedimentation rate     Status: Abnormal   Collection Time: 05/19/16 11:13 AM  Result Value Ref Range   Sed Rate 99 (H) 0 - 22 mm/hr  C-reactive protein     Status: Abnormal   Collection Time: 05/19/16 11:13 AM  Result Value Ref Range   CRP 14.2 (H) <1.0 mg/dL    Comment: Performed at [facility]  HIV antibody (routine testing) (NOT for [facility])     Status: None   Collection Time: 05/19/16 11:13 AM  Result Value Ref Range   HIV Screen 4th Generation wRfx Non Reactive Non Reactive  Comment: (NOTE) Performed At: [facility] [place], [place] [id number] Lindon Romp MD PO:[id number]   CK total and CKMB (cardiac)not at [facility]     Status: Abnormal   Collection Time: 05/19/16 11:13 AM  Result Value Ref Range   Total CK 33 (L) 38 - 234 U/L   CK, MB 1.5 0.5 - 5.0 ng/mL   Relative Index RELATIVE INDEX IS INVALID 0.0 - 2.5    Comment: WHEN CK < 100 U/L        Performed at [facility]   Glucose, capillary     Status: Abnormal   Collection Time: 05/19/16  4:45 PM  Result Value Ref Range   Glucose-Capillary 122 (H) 65 - 99 mg/dL   Comment 1 Notify RN    Comment 2 Document in Chart   Glucose, capillary     Status: Abnormal   Collection Time: 05/19/16  7:24 PM  Result Value Ref Range   Glucose-Capillary 112 (H) 65 - 99 mg/dL  Glucose, capillary     Status: Abnormal   Collection Time: 05/20/16 12:18 AM  Result Value Ref Range   Glucose-Capillary 107 (H) 65 - 99 mg/dL  Heparin level (unfractionated)     Status: Abnormal   Collection Time: 05/20/16  4:20 AM  Result Value Ref Range   Heparin Unfractionated 0.24 (L) 0.30 - 0.70 IU/mL    Comment:        IF HEPARIN RESULTS ARE BELOW EXPECTED VALUES, AND PATIENT DOSAGE HAS BEEN CONFIRMED, SUGGEST FOLLOW UP TESTING OF ANTITHROMBIN III LEVELS.   CBC     Status: Abnormal    Collection Time: 05/20/16  4:20 AM  Result Value Ref Range   WBC 18.2 (H) 4.0 - 10.5 K/uL   RBC 2.75 (L) 3.87 - 5.11 MIL/uL   Hemoglobin 8.4 (L) 12.0 - 15.0 g/dL   HCT 27.6 (L) 36.0 - 46.0 %   MCV 100.4 (H) 78.0 - 100.0 fL   MCH 30.5 26.0 - 34.0 pg   MCHC 30.4 30.0 - 36.0 g/dL   RDW 16.7 (H) 11.5 - 15.5 %   Platelets 368 150 - 400 K/uL  Renal function panel     Status: Abnormal   Collection Time: 05/20/16  4:20 AM  Result Value Ref Range   Sodium 141 135 - 145 mmol/L   Potassium 3.5 3.5 - 5.1 mmol/L   Chloride 100 (L) 101 - 111 mmol/L   CO2 26 22 - 32 mmol/L   Glucose, Bld 164 (H) 65 - 99 mg/dL   BUN 64 (H) 6 - 20 mg/dL   Creatinine, Ser 8.74 (H) 0.44 - 1.00 mg/dL   Calcium 9.4 8.9 - 10.3 mg/dL   Phosphorus 5.1 (H) 2.5 - 4.6 mg/dL   Albumin 2.9 (L) 3.5 - 5.0 g/dL   GFR calc non Af Amer 4 (L) >60 mL/min   GFR calc Af Amer 4 (L) >60 mL/min    Comment: (NOTE) The eGFR has been calculated using the CKD EPI equation. This calculation has not been validated in all clinical situations. eGFR's persistently <60 mL/min signify possible Chronic Kidney Disease.    Anion gap 15 5 - 15  Glucose, capillary     Status: Abnormal   Collection Time: 05/20/16  5:07 AM  Result Value Ref Range   Glucose-Capillary 147 (H) 65 - 99 mg/dL  Blood gas, arterial     Status: Abnormal   Collection Time: 05/20/16  6:18 AM  Result Value Ref Range  FIO2 30.00    Delivery systems VENTILATOR    Mode PRESSURE REGULATED VOLUME CONTROL    VT 380 mL   LHR 14 resp/min   Peep/cpap 5.0 cm H20   pH, Arterial 7.418 7.350 - 7.450   pCO2 arterial 41.9 32.0 - 48.0 mmHg   pO2, Arterial 112 (H) 83.0 - 108.0 mmHg   Bicarbonate 26.5 20.0 - 28.0 mmol/L   Acid-Base Excess 2.4 (H) 0.0 - 2.0 mmol/L   O2 Saturation 96.8 %   Patient temperature 37.0    Collection site RIGHT RADIAL    Drawn by [id number]    Sample type ARTERIAL DRAW    Allens test (pass/fail) POSITIVE (A) PASS    ABGS  Recent Labs  05/20/16 0618   PHART 7.418  PO2ART 112*  HCO3 26.5   CULTURES Recent Results (from the past 240 hour(s))  Culture, blood (routine x 2)     Status: None (Preliminary result)   Collection Time: 05/17/16 10:49 PM  Result Value Ref Range Status   Specimen Description BLOOD PICC  Final   Special Requests BOTTLES DRAWN AEROBIC AND ANAEROBIC 10CC EACH  Final   Culture NO GROWTH 2 DAYS  Final   Report Status PENDING  Incomplete  Culture, blood (routine x 2)     Status: None (Preliminary result)   Collection Time: 05/17/16 10:51 PM  Result Value Ref Range Status   Specimen Description BLOOD PICC  Final   Special Requests BOTTLES DRAWN AEROBIC AND ANAEROBIC 10CC EACH  Final   Culture NO GROWTH 2 DAYS  Final   Report Status PENDING  Incomplete   Studies/Results: Ct Head Wo Contrast  Result Date: 05/19/2016 CLINICAL DATA:  Altered mental status. Hypertensive crisis. Cardiac arrest. Unable to wean from ventilator. EXAM: CT HEAD WITHOUT CONTRAST TECHNIQUE: Contiguous axial images were obtained from the base of the skull through the vertex without intravenous contrast. COMPARISON:  03/09/2016 FINDINGS: Motion artifact at the skullbase. Brain: There is no evidence of acute cortical infarct, intracranial hemorrhage, mass, midline shift, or extra-axial fluid collection. Mild cerebral atrophy is unchanged. Confluent cerebral white matter hypodensities are similar to the prior study and compatible with extensive chronic small vessel ischemic disease. There are chronic lacunar infarcts in the basal ganglia. Vascular: Calcified atherosclerosis at the skullbase. No hyperdense vessel. Skull: No fracture or focal osseous lesion. Sinuses/Orbits: Visualized paranasal sinuses and mastoid air cells are clear. Visualized orbits are grossly unremarkable. Other: None. IMPRESSION: 1. No evidence of acute intracranial abnormality. 2. Extensive chronic small vessel ischemic disease. Electronically Signed   By: Logan Bores M.D.   On:  05/19/2016 10:53   US Venous Img Upper Uni Right  Result Date: 05/18/2016 CLINICAL DATA:  Right hand swelling, redness and pain. Recent imaging demonstrating superficial thrombophlebitis within the right basilic vein. EXAM: RIGHT UPPER EXTREMITY VENOUS DOPPLER ULTRASOUND TECHNIQUE: Gray-scale sonography with graded compression, as well as color Doppler and duplex ultrasound were performed to evaluate the upper extremity deep venous system from the level of the subclavian vein and including the jugular, axillary, basilic, radial, ulnar and upper cephalic vein. Spectral Doppler was utilized to evaluate flow at rest and with distal augmentation maneuvers. COMPARISON:  05/13/2016 FINDINGS: Contralateral Subclavian Vein: Respiratory phasicity is normal and symmetric with the symptomatic side. No evidence of thrombus. Normal compressibility. Internal Jugular Vein: No evidence of thrombus. Normal compressibility, respiratory phasicity and response to augmentation. Subclavian Vein: No evidence of thrombus. Normal compressibility, respiratory phasicity and response to augmentation. Axillary Vein: No evidence  of thrombus. Normal compressibility, respiratory phasicity and response to augmentation. Cephalic Vein: No evidence of thrombus. Normal compressibility, respiratory phasicity and response to augmentation. Basilic Vein: Stable evidence of echogenic thrombus in a nondistended basilic vein. Overall appearance is more suggestive of chronic thrombus and may relate to prior catheter placement such as a PICC line. Given history of end-stage renal disease, this could also relate to chronic occlusion post prior surgical dialysis access. Brachial Veins: No evidence of thrombus. Normal compressibility, respiratory phasicity and response to augmentation. Radial Veins: No evidence of thrombus. Normal compressibility, respiratory phasicity and response to augmentation. Ulnar Veins: No evidence of thrombus. Normal compressibility,  respiratory phasicity and response to augmentation. Venous Reflux:  None visualized. Other Findings:  None visualized. IMPRESSION: Stable appearance of thrombus in the right basilic vein. Based on sonographic appearance, this is favored to more likely represent chronic thrombus rather than acute thrombus. This could relate to prior catheter placement or dialysis access. Electronically Signed   By: Aletta Edouard M.D.   On: 05/18/2016 16:35    Medications:  Prior to Admission:  Prescriptions Prior to Admission  Medication Sig Dispense Refill Last Dose  . ALPRAZolam (XANAX) 0.25 MG tablet Take 1-2 tablets by mouth at bedtime as needed for sleep.    05/08/2016 at Unknown time  . amiodarone (PACERONE) 200 MG tablet Take 1 tablet (200 mg total) by mouth daily. 30 tablet 3 05/08/2016 at Unknown time  . aspirin EC 81 MG tablet Take 81 mg by mouth daily.    05/08/2016 at Unknown time  . cloNIDine (CATAPRES) 0.1 MG tablet Take 1 tablet (0.1 mg total) by mouth 2 (two) times daily. 60 tablet 11 05/08/2016 at Unknown time  . epoetin alfa (EPOGEN,PROCRIT) 36468 UNIT/ML injection Inject 1 mL (10,000 Units total) into the vein every Monday, Wednesday, and Friday with hemodialysis. 1 mL 10 05/06/2016 at Unknown time  . fluticasone (FLONASE) 50 MCG/ACT nasal spray Place 2 sprays into the nose daily as needed for allergies.    unknown  . labetalol (NORMODYNE) 200 MG tablet Take 1 tablet (200 mg total) by mouth 2 (two) times daily. 90 tablet 1 05/08/2016 at Unknown time  . lanthanum (FOSRENOL) 1000 MG chewable tablet Chew 1 tablet (1,000 mg total) by mouth 3 (three) times daily with meals. 90 tablet 1 05/08/2016 at Unknown time  . multivitamin (RENA-VIT) TABS tablet Take 1 tablet by mouth daily.   05/08/2016 at Unknown time  . nitroGLYCERIN (NITROSTAT) 0.4 MG SL tablet Place 1 tablet (0.4 mg total) under the tongue every 5 (five) minutes as needed for chest pain. 25 tablet 3 05/08/2016 at Unknown time  . omeprazole  (PRILOSEC) 20 MG capsule TAKE ONE CAPSULE BY MOUTH ONCE DAILY 30 capsule 5 05/08/2016 at Unknown time  . PROAIR HFA 108 (90 BASE) MCG/ACT inhaler Inhale 1 puff into the lungs every 6 (six) hours as needed for wheezing or shortness of breath.    unknown  . SENSIPAR 30 MG tablet Take 30 mg by mouth daily with supper.    05/08/2016 at Unknown time  . simvastatin (ZOCOR) 20 MG tablet Take 1 tablet (20 mg total) by mouth at bedtime. 90 tablet 3 05/07/2016 at Unknown time   Scheduled: . amiodarone  200 mg Oral Daily  . aspirin EC  81 mg Oral Daily  . chlorhexidine gluconate (MEDLINE KIT)  15 mL Mouth Rinse BID  . famotidine (PEPCID) IV  20 mg Intravenous Q24H  . mouth rinse  15 mL Mouth Rinse  QID  . multivitamin  1 tablet Oral Daily  . piperacillin-tazobactam (ZOSYN)  IV  3.375 g Intravenous Q12H  . polyethylene glycol  17 g Per Tube Daily  . vancomycin  500 mg Intravenous Q T,Th,Sa-HD   Continuous: . feeding supplement (VITAL AF 1.2 CAL) 1,000 mL (05/20/16 0600)  . heparin 800 Units/hr (05/20/16 0600)   PHK:FEXMDY chloride, sodium chloride, sodium chloride, sodium chloride, sodium chloride, sodium chloride, sodium chloride, sodium chloride, acetaminophen, acetaminophen, ALPRAZolam, alteplase, alteplase, fentaNYL (SUBLIMAZE) injection, fentaNYL (SUBLIMAZE) injection, fluticasone, heparin, heparin, ipratropium-albuterol, lidocaine (PF), lidocaine-prilocaine, midazolam, ondansetron (ZOFRAN) IV, pentafluoroprop-tetrafluoroeth  Assesment: She was admitted with hypertensive crisis. She has end-stage renal disease on hemodialysis. After admission she had CODE BLUE 2 and has been on the ventilator essentially since admission. She has been apneic when we attempted to wean her. She is still undergoing hemodialysis.  She has heart failure baseline managed with dialysis. Principal Problem:   Hypertensive crisis Active Problems:   Coronary atherosclerosis of native coronary artery   ESRD on hemodialysis  (HCC)   Anemia of chronic kidney failure   Chronic diastolic CHF (congestive heart failure) (HCC)   Chest pain   Acute respiratory failure ([place] [facility])   Hypertensive crisis without congestive heart failure   Cardiac arrest [facility])   Palliative care encounter   DNR (do not resuscitate) discussion   Goals of care, counseling/discussion    Plan: Attempt weaning today. Discussed with her husband at bedside. I told him that she's not able to wean today we are going to have to start making arrangements for tracheostomy. He understands. She would be at 14 days of mechanical ventilation on Monday the 13th    LOS: 11 days   Colson Barco L 05/20/2016, 7:18 AM

## 2016-05-20 NOTE — Progress Notes (Signed)
Subjective: Interval History: Patient is more alert today. She remains intubated.  Objective: Vital signs in last 24 hours: Temp:  [96.7 F (35.9 C)-98.4 F (36.9 C)] 96.7 F (35.9 C) (11/10 0400) Pulse Rate:  [84-100] 92 (11/10 0700) Resp:  [14-20] 14 (11/10 0700) BP: (103-178)/(49-79) 125/61 (11/10 0700) SpO2:  [97 %-100 %] 100 % (11/10 0823) FiO2 (%):  [30 %] 30 % (11/10 0823) Weight:  [55 kg (121 lb 4.1 oz)-55.5 kg (122 lb 5.7 oz)] 55 kg (121 lb 4.1 oz) (11/10 0500) Weight change: 0 kg (0 lb)  Intake/Output from previous day: 11/09 0701 - 11/10 0700 In: 9528 [I.V.:208; NG/GT:1360; IV Piggyback:200] Out: 410  Intake/Output this shift: No intake/output data recorded.  Patient remains intubated. Patient is alert today and follow verbal orders. Chest: She has bilateral inspiratory crackles. Heart: RRR  Extremties: No edema  Lab Results:  Recent Labs  05/19/16 0423 05/20/16 0420  WBC 19.0* 18.2*  HGB 9.5* 8.4*  HCT 29.9* 27.6*  PLT 404* 368   BMET:   Recent Labs  05/19/16 0423 05/20/16 0420  NA 137  138 141  K 4.1  4.1 3.5  CL 95*  96* 100*  CO2 25  26 26   GLUCOSE 181*  181* 164*  BUN 70*  71* 64*  CREATININE 9.94*  10.20* 8.74*  CALCIUM 8.9  9.0 9.4   No results for input(s): PTH in the last 72 hours. Iron Studies: No results for input(s): IRON, TIBC, TRANSFERRIN, FERRITIN in the last 72 hours.  Studies/Results: Ct Head Wo Contrast  Result Date: 05/19/2016 CLINICAL DATA:  Altered mental status. Hypertensive crisis. Cardiac arrest. Unable to wean from ventilator. EXAM: CT HEAD WITHOUT CONTRAST TECHNIQUE: Contiguous axial images were obtained from the base of the skull through the vertex without intravenous contrast. COMPARISON:  03/09/2016 FINDINGS: Motion artifact at the skullbase. Brain: There is no evidence of acute cortical infarct, intracranial hemorrhage, mass, midline shift, or extra-axial fluid collection. Mild cerebral atrophy is unchanged.  Confluent cerebral white matter hypodensities are similar to the prior study and compatible with extensive chronic small vessel ischemic disease. There are chronic lacunar infarcts in the basal ganglia. Vascular: Calcified atherosclerosis at the skullbase. No hyperdense vessel. Skull: No fracture or focal osseous lesion. Sinuses/Orbits: Visualized paranasal sinuses and mastoid air cells are clear. Visualized orbits are grossly unremarkable. Other: None. IMPRESSION: 1. No evidence of acute intracranial abnormality. 2. Extensive chronic small vessel ischemic disease. Electronically Signed   By: Logan Bores M.D.   On: 05/19/2016 10:53   US Venous Img Upper Uni Right  Result Date: 05/18/2016 CLINICAL DATA:  Right hand swelling, redness and pain. Recent imaging demonstrating superficial thrombophlebitis within the right basilic vein. EXAM: RIGHT UPPER EXTREMITY VENOUS DOPPLER ULTRASOUND TECHNIQUE: Gray-scale sonography with graded compression, as well as color Doppler and duplex ultrasound were performed to evaluate the upper extremity deep venous system from the level of the subclavian vein and including the jugular, axillary, basilic, radial, ulnar and upper cephalic vein. Spectral Doppler was utilized to evaluate flow at rest and with distal augmentation maneuvers. COMPARISON:  05/13/2016 FINDINGS: Contralateral Subclavian Vein: Respiratory phasicity is normal and symmetric with the symptomatic side. No evidence of thrombus. Normal compressibility. Internal Jugular Vein: No evidence of thrombus. Normal compressibility, respiratory phasicity and response to augmentation. Subclavian Vein: No evidence of thrombus. Normal compressibility, respiratory phasicity and response to augmentation. Axillary Vein: No evidence of thrombus. Normal compressibility, respiratory phasicity and response to augmentation. Cephalic Vein: No evidence of thrombus.  Normal compressibility, respiratory phasicity and response to augmentation.  Basilic Vein: Stable evidence of echogenic thrombus in a nondistended basilic vein. Overall appearance is more suggestive of chronic thrombus and may relate to prior catheter placement such as a PICC line. Given history of end-stage renal disease, this could also relate to chronic occlusion post prior surgical dialysis access. Brachial Veins: No evidence of thrombus. Normal compressibility, respiratory phasicity and response to augmentation. Radial Veins: No evidence of thrombus. Normal compressibility, respiratory phasicity and response to augmentation. Ulnar Veins: No evidence of thrombus. Normal compressibility, respiratory phasicity and response to augmentation. Venous Reflux:  None visualized. Other Findings:  None visualized. IMPRESSION: Stable appearance of thrombus in the right basilic vein. Based on sonographic appearance, this is favored to more likely represent chronic thrombus rather than acute thrombus. This could relate to prior catheter placement or dialysis access. Electronically Signed   By: Aletta Edouard M.D.   On: 05/18/2016 16:35    I have reviewed the patient's current medications.  Assessment/Plan: Problem #1 patient with episode of bradycardia/unresponsive/status post CODE BLUE and intubated. Presently patient is more alert today. She seems to follow verbal orders. Problem #2 hypertension: Her blood pressure seems to be high this morning. Patient had hypotension yesterday before dialysis. Her blood pressure was still low on dialysis but able to complete her treatment was not able to ultrafiltrate. Problem #3 end-stage renal disease: Problem #5 anemia: Her hemoglobin is low and complaining. Presently she is getting Epogen during dialysis. Problem #6 fluid management: No significant sign of fluid overload. Problem #7 metabolic bone disease: Her calcium is normal but her phosphorus is high. . Plan: 1] patient doesn't require dialysis today. We'll make arrangements for dialysis  tomorrow. 2] will check renal panel in the morning 3] will try tomorrow if patient becomes more stable.   LOS: 11 days   Kc Sedlak S 05/20/2016,8:43 AM

## 2016-05-20 NOTE — Progress Notes (Addendum)
ANTICOAGULATION CONSULT NOTE - follow up  Pharmacy Consult for Heparin Indication: R/O DVT  Patient Measurements: Height: 5' 1" (154.9 cm) Weight: 121 lb 4.1 oz (55 kg) IBW/kg (Calculated) : 47.8  HEPARIN DW (KG): 53.7   Vital Signs: Temp: 96.7 F (35.9 C) (11/10 0400) Temp Source: Axillary (11/10 0400) BP: 125/61 (11/10 0700) Pulse Rate: 92 (11/10 0700)  Labs:  Recent Labs  05/18/16 1354 05/18/16 1835 05/19/16 0423 05/19/16 1113 05/20/16 0420  HGB  --  9.7* 9.5*  --  8.4*  HCT  --  30.8* 29.9*  --  27.6*  PLT  --  392 404*  --  368  HEPARINUNFRC 0.66  --  0.48  --  0.24*  CREATININE  --  9.13* 9.94*  10.20*  --  8.74*  CKTOTAL  --   --   --  33*  --   CKMB  --   --   --  1.5  --    Estimated Creatinine Clearance: 4.1 mL/min (by C-G formula based on SCr of 8.74 mg/dL (H)).  Medical History: Past Medical History:  Diagnosis Date  . Anxiety   . Arthritis   . AVM (arteriovenous malformation) of colon   . CAD (coronary artery disease)    a. 12/2011 NSTEMI/Cath/PCI LCX (2.25x14 Resolute DES) & D1 (2.25x22 Resolute DES);  b. 01/2012 Cath/PCI: LM 30, LAD 30p, 40-23m D1 stent ok, 99 in sm branch of diag, LCX patent stent, OM1 20, RCA 95 ost (4.0x12 Promus DES), EF 55%;  c. 04/2012 Lexi Cardiolite  EF 48%, small area of scar @ base/mid inflat wall with mild peri-infarct ischemia.; CABG 12/4  . Carotid artery disease ([facility]    a. 623-53%LICA, 96/1443  . Chronic bronchitis ([facility]   . Chronic diastolic CHF (congestive heart failure) ([facility]    a. 02/2012 Echo EF 60-65%, nl wall motion, Gr 1 DD, mod MR  . Colon cancer [place] 1992  . Esophageal stricture   . [place]D [place] ([facility]    ESRD due to HTN, started dialysis 2011 [facility]  Access is LUA AVF as of Sept 2014.   .Marland KitchenGERD (gastroesophageal reflux disease)   . High cholesterol 12/2011  . History of blood transfusion 07/2011; 12/2011; 01/2012 X 2; 04/2012  . History of gout    . History of lower GI bleeding   . Hypertension   . Iron deficiency anemia   . Mitral regurgitation    a. Moderate by echo, 02/2012  . Myocardial infarction   . Ovarian cancer ([facility] 1992  . Pneumonia ~ 2009  . PUD (peptic ulcer disease)   . TIA (transient ischemic attack)    Medications:  Scheduled:  . amiodarone  200 mg Oral Daily  . aspirin EC  81 mg Oral Daily  . chlorhexidine gluconate (MEDLINE KIT)  15 mL Mouth Rinse BID  . famotidine (PEPCID) IV  20 mg Intravenous Q24H  . mouth rinse  15 mL Mouth Rinse QID  . multivitamin  1 tablet Oral Daily  . piperacillin-tazobactam (ZOSYN)  IV  3.375 g Intravenous Q12H  . polyethylene glycol  17 g Per Tube Daily  . vancomycin  500 mg Intravenous Q T,Th,Sa-HD   Assessment: Venous dopplers reporting stable appearance of thrombus in the right basilic vein. IV heparin until DVT ruled out. Troponins trending down, cardiology following and felt may be demand ischemia/related to CPR.  Heparin level is slightly subtherapeutic this  am.  Goal of Therapy:  Heparin level 0.3-0.7 units/ml Monitor platelets by anticoagulation protocol: Yes   Plan:  Increase heparin infusion to 900 units/hr Check heparin level later today Heparin level and CBC daily Monitor for s/s of bleeding   Thanks for allowing pharmacy to be a part of this patient's care.   , PharmD Clinical Pharmacist  05/20/2016,7:35 AM  Addum:  Heparin level is therapeutic.  Cont drip at 900 units/hr.  F/u am labs 

## 2016-05-21 LAB — GLUCOSE, CAPILLARY
GLUCOSE-CAPILLARY: 103 mg/dL — AB (ref 65–99)
GLUCOSE-CAPILLARY: 88 mg/dL (ref 65–99)
GLUCOSE-CAPILLARY: 92 mg/dL (ref 65–99)
GLUCOSE-CAPILLARY: 94 mg/dL (ref 65–99)
Glucose-Capillary: 104 mg/dL — ABNORMAL HIGH (ref 65–99)
Glucose-Capillary: 94 mg/dL (ref 65–99)

## 2016-05-21 LAB — RENAL FUNCTION PANEL
ALBUMIN: 2.8 g/dL — AB (ref 3.5–5.0)
Anion gap: 17 — ABNORMAL HIGH (ref 5–15)
BUN: 86 mg/dL — AB (ref 6–20)
CALCIUM: 10 mg/dL (ref 8.9–10.3)
CO2: 24 mmol/L (ref 22–32)
CREATININE: 11.03 mg/dL — AB (ref 0.44–1.00)
Chloride: 101 mmol/L (ref 101–111)
GFR, EST AFRICAN AMERICAN: 3 mL/min — AB (ref >60)
GFR, EST NON AFRICAN AMERICAN: 3 mL/min — AB (ref >60)
Glucose, Bld: 111 mg/dL — ABNORMAL HIGH (ref 65–99)
Phosphorus: 8.4 mg/dL — ABNORMAL HIGH (ref 2.5–4.6)
Potassium: 4.6 mmol/L (ref 3.5–5.1)
SODIUM: 142 mmol/L (ref 135–145)

## 2016-05-21 LAB — CBC
HCT: 26.7 % — ABNORMAL LOW (ref 36.0–46.0)
Hemoglobin: 8 g/dL — ABNORMAL LOW (ref 12.0–15.0)
MCH: 30.5 pg (ref 26.0–34.0)
MCHC: 30 g/dL (ref 30.0–36.0)
MCV: 101.9 fL — ABNORMAL HIGH (ref 78.0–100.0)
PLATELETS: 395 10*3/uL (ref 150–400)
RBC: 2.62 MIL/uL — AB (ref 3.87–5.11)
RDW: 17 % — AB (ref 11.5–15.5)
WBC: 16.9 10*3/uL — AB (ref 4.0–10.5)

## 2016-05-21 LAB — HEPARIN LEVEL (UNFRACTIONATED): HEPARIN UNFRACTIONATED: 0.49 [IU]/mL (ref 0.30–0.70)

## 2016-05-21 MED ORDER — EPOETIN ALFA 20000 UNIT/ML IJ SOLN
INTRAMUSCULAR | Status: AC
  Administered 2016-05-21: 14000 [IU] via INTRAVENOUS
  Filled 2016-05-21: qty 1

## 2016-05-21 MED ORDER — HYDRALAZINE HCL 20 MG/ML IJ SOLN
5.0000 mg | Freq: Four times a day (QID) | INTRAMUSCULAR | Status: DC | PRN
  Administered 2016-05-21 – 2016-05-22 (×3): 5 mg via INTRAVENOUS
  Filled 2016-05-21 (×3): qty 1

## 2016-05-21 NOTE — Progress Notes (Signed)
Dr Wendee Beavers updated on pt BP =201/98 Will give hydralazine per Dr Wendee Beavers

## 2016-05-21 NOTE — Progress Notes (Signed)
Subjective: Interval History: Patient shakes her head to say yes and no. Presently Shawna [name] is no complaints. No chest pain or difficulty breathing. Her speech is limited because of sore throat after extubation.  Objective: Vital signs in last 24 hours: Temp:  [96.7 F (35.9 C)-98.1 F (36.7 C)] 97.9 F (36.6 C) (11/11 0718) Pulse Rate:  [86-97] 88 (11/11 0718) Resp:  [13-31] 27 (11/11 0718) BP: (101-172)/(67-127) 142/101 (11/11 0500) SpO2:  [95 %-100 %] 100 % (11/11 0718) FiO2 (%):  [30 %] 30 % (11/10 1029) Weight:  [54.6 kg (120 lb 5.9 oz)] 54.6 kg (120 lb 5.9 oz) (11/11 0400) Weight change: -0.9 kg (-1 lb 15.7 oz)  Intake/Output from previous day: 11/10 0701 - 11/11 0700 In: 405.4 [I.V.:205.4; IV Piggyback:200] Out: -  Intake/Output this shift: No intake/output data recorded.  Patient is alert and in no apparent distress Chest: She has bilateral inspiratory crackles. Heart: RRR  Extremties: No edema  Lab Results:  Recent Labs  05/20/16 0420 05/21/16 0510  WBC 18.2* 16.9*  HGB 8.4* 8.0*  HCT 27.6* 26.7*  PLT 368 395   BMET:   Recent Labs  05/20/16 0420 05/21/16 0510  NA 141 142  K 3.5 4.6  CL 100* 101  CO2 26 24  GLUCOSE 164* 111*  BUN 64* 86*  CREATININE 8.74* 11.03*  CALCIUM 9.4 10.0   No results for input(s): PTH in the last 72 hours. Iron Studies: No results for input(s): IRON, TIBC, TRANSFERRIN, FERRITIN in the last 72 hours.  Studies/Results: Ct Head Wo Contrast  Result Date: 05/19/2016 CLINICAL DATA:  Altered mental status. Hypertensive crisis. Cardiac arrest. Unable to wean from ventilator. EXAM: CT HEAD WITHOUT CONTRAST TECHNIQUE: Contiguous axial images were obtained from the base of the skull through the vertex without intravenous contrast. COMPARISON:  03/09/2016 FINDINGS: Motion artifact at the skullbase. Brain: There is no evidence of acute cortical infarct, intracranial hemorrhage, mass, midline shift, or extra-axial fluid collection. Mild  cerebral atrophy is unchanged. Confluent cerebral white matter hypodensities are similar to the prior study and compatible with extensive chronic small vessel ischemic disease. There are chronic lacunar infarcts in the basal ganglia. Vascular: Calcified atherosclerosis at the skullbase. No hyperdense vessel. Skull: No fracture or focal osseous lesion. Sinuses/Orbits: Visualized paranasal sinuses and mastoid air cells are clear. Visualized orbits are grossly unremarkable. Other: None. IMPRESSION: 1. No evidence of acute intracranial abnormality. 2. Extensive chronic small vessel ischemic disease. Electronically Signed   By: Logan Bores M.D.   On: 05/19/2016 10:53    I have reviewed the patient's current medications.  Assessment/Plan: Problem #1 patient with episode of bradycardia/unresponsive/status post CODE BLUE patient was initially intubated but starting yesterday patient is extubated. Problem #2 hypertension: Her blood pressure seems to be high this morning Problem #3 end-stage renal disease: She is status post hemodialysis on Thursday. Her potassium is normal  Problem #5 anemia: Her hemoglobin is low and decling. Presently she is getting Epogen during dialysis. Problem #6 fluid management: No significant sign of fluid overload. Problem #7 metabolic bone disease: Her calcium is normal but her phosphorus is high. Not on Binder as patient was intubated and not on oral feeding Plan: 1] We will dialyze patient today 2] will check renal panel in the morning 3] Continue with Epogen   LOS: 12 days   Shawna [name] S 05/21/2016,9:04 AM

## 2016-05-21 NOTE — Progress Notes (Addendum)
PROGRESS NOTE    SHALOM WARE  IRJ:[id number] DOB: 1941-07-09 DOA: 05/08/2016 PCP: [name], MD    Brief Narrative:  43 yof with a history of CAD and ESRD, presented with acute onset of chest pain unrelieved by aspirin and nitroglycerin. In the emergency department BP 230/130 and she was noted to have new ST depression. She was started on nitroglycerin infusion and IV labetalol and admitted for hypertensive crisis with chest pain.Her chest pain had resolved but she remained hypertensive. On 11/1 patient suddenly became bradycardic and unresponsive. She underwent CPR, chest compressions, received epinephrine and had ROSC. On 11/2 prior to starting her dialysis she was noted to be hypertensive so she was given IV labetalol and developed bradycardia with subsequent PEA arrest. She underwent CPR again for < 40mns and had ROSC. She currently remains on ventilator. Weaning has been difficult due to periods of apnea. Pulmonology and Neurology assisting.  Assessment & Plan:   Principal Problem:   Hypertensive crisis Active Problems:   Coronary atherosclerosis of native coronary artery   ESRD on hemodialysis (HCC)   Anemia of chronic kidney failure   Chronic diastolic CHF (congestive heart failure) (HCC)   Chest pain   Acute respiratory failure ([facility]   Hypertensive crisis without congestive heart failure   Cardiac arrest ([facility]   Palliative care encounter   DNR (do not resuscitate) discussion   Goals of care, counseling/discussion  1. Bradycardia with PEA cardiac arrest. On 11/1, patient's heart rate became bradycardic into PEA. She underwent CPR, received epinephrine, bicarbonate and calcium chloride. She had ROSC after approximately 25 mins of CPR. Lab work had indicated an elevated potassium of 6.4, which may have been cause. She was brieflystarted on a epinephrine infusion which was quicklyweaned off. CXR did not show any acute process. Cardiology following. Resolved 2. Acute  respiratory failure: Ventilator dependent. Patient intubated while undergoing CPR. Pulmonology following. She is undergoing daily weaning trials. She remains hemodynamically stable. Weaning has been unsuccessful due to intermittent periods of apnea. Neurology on board. Pt currently extubated. Will continue to monitor in ICU 3. Hypertensive crisis with chest pain. Chest pain resolved. Cards has been on board. HTN crisis resolved. Will place prn Hydralazine for elevated blood pressures 4. Elevated troponin. Patient has a history of CAD status post CABG and stenting. Patient had no evidence of ACS on admission. Since that time, her troponin has started to rise. Cardiology following and felt that this may be demand ischemia/related to CPR. Troponin peak was 1.5 and has since trended down since.  5. ESRD on HD MWF. Nephrology is following for dialysis needs.  6. Chronic diastolic congestive heart failure. Patient has an echocardiogram on 09/2015 with an EF of 630-16% grade 2 diastolic dysfunction. Continue fluid management with dialysis. 7. PSVT, maintained on amiodarone. EKG remains in sinus rhythm.  8. Myoclonus. Suspect secondary to CKD/ESRD on HD. No history or objective findings to suggest acute CNS process. Reviewed meds, no likely offenders. Neurology on board. 9. Hyperkalemia. Resolved.  10. Elevated TSH. Recheck as an outpatient. 11. Anemia of chronic kidney disease. Hemoglobin currently stable. Will continue to monitor. 12. Right hand swelling. Started on IV antibiotics due to concern of cellulitis. Xray of hand does not show any gas. Venous dopplers reporting stable appearance of thrombus in the right basilic vein. on IV heparin  DVT prophylaxis: Heparin infusion Code Status: FULL  Family Communication: husband at bedside Disposition Plan: pending clinical improvement    Consultants:   Nephrology   Cardiology  Pulmonology  Nutrition   Neurology  Procedures:   Intubation  11/1>>  Antimicrobials  Vancomycin 11/7>>  Zosyn 11/7>>   Subjective: Addendum: pt extubated. Limited non verbal responses to examiner  Objective: Vitals:   05/21/16 0430 05/21/16 0500 05/21/16 0530 05/21/16 0718  BP:  (!) 142/101    Pulse: 89 88 86 88  Resp: (!) 27 (!) 31 14 (!) 27  Temp:    97.9 F (36.6 C)  TempSrc:    Axillary  SpO2: 100% 99% 100% 100%  Weight:      Height:        Intake/Output Summary (Last 24 hours) at 05/21/16 0944 Last data filed at 05/21/16 0500  Gross per 24 hour  Intake           392.45 ml  Output                0 ml  Net           392.45 ml   Filed Weights   05/19/16 1130 05/20/16 0500 05/21/16 0400  Weight: 55.5 kg (122 lb 5.7 oz) 55 kg (121 lb 4.1 oz) 54.6 kg (120 lb 5.9 oz)    Examination:  General exam: Appears calm and comfortable, in nad. Respiratory system: Clear to auscultation. Respiratory effort normal. Cardiovascular system: S1 & S2 heard, RRR. No JVD, murmurs, rubs, gallops or clicks. No pedal edema. Gastrointestinal system: Abdomen is nondistended, soft and nontender. No organomegaly or masses felt. Normal bowel sounds heard. Central nervous system: somnolent, reacts to noxious stimuli Extremities: right hand is edematous. Pulses are palpable. Erythema and warmth noted over hand. Skin: No rashes, lesions or ulcers Psychiatry: cannot assess    Data Reviewed: I have personally reviewed following labs and imaging studies  CBC:  Recent Labs Lab 05/18/16 0139 05/18/16 1835 05/19/16 0423 05/20/16 0420 05/21/16 0510  WBC 10.4 15.8* 19.0* 18.2* 16.9*  HGB 9.4* 9.7* 9.5* 8.4* 8.0*  HCT 30.2* 30.8* 29.9* 27.6* 26.7*  MCV 99.3 98.4 98.7 100.4* 101.9*  PLT 338 392 404* 368 626   Basic Metabolic Panel:  Recent Labs Lab 05/18/16 0139 05/18/16 1835 05/19/16 0423 05/20/16 0420 05/21/16 0510  NA 137 138 137  138 141 142  K 3.6 3.9 4.1  4.1 3.5 4.6  CL 97* 96* 95*  96* 100* 101  CO2 '26 26 25  26 26 24  '$ GLUCOSE  170* 146* 181*  181* 164* 111*  BUN 43* 60* 70*  71* 64* 86*  CREATININE 7.28* 9.13* 9.94*  10.20* 8.74* 11.03*  CALCIUM 9.0 9.2 8.9  9.0 9.4 10.0  PHOS  --  8.2* 8.0* 5.1* 8.4*   GFR: Estimated Creatinine Clearance: 3.3 mL/min (by C-G formula based on SCr of 11.03 mg/dL (H)). Liver Function Tests:  Recent Labs Lab 05/18/16 1835 05/19/16 0423 05/20/16 0420 05/21/16 0510  AST  --  36  --   --   ALT  --  178*  --   --   ALKPHOS  --  75  --   --   BILITOT  --  0.9  --   --   PROT  --  6.4*  --   --   ALBUMIN 3.0* 2.8*  2.8* 2.9* 2.8*   No results for input(s): LIPASE, AMYLASE in the last 168 hours. No results for input(s): AMMONIA in the last 168 hours. Coagulation Profile: No results for input(s): INR, PROTIME in the last 168 hours. Cardiac Enzymes:  Recent Labs Lab 05/19/16 1113  CKTOTAL 33*  CKMB 1.5   BNP (last 3 results) No results for input(s): PROBNP in the last 8760 hours. HbA1C: No results for input(s): HGBA1C in the last 72 hours. CBG:  Recent Labs Lab 05/20/16 1650 05/20/16 2020 05/20/16 2354 05/21/16 0420 05/21/16 0716  GLUCAP 125* 113* 103* 104* 94   Lipid Profile: No results for input(s): CHOL, HDL, LDLCALC, TRIG, CHOLHDL, LDLDIRECT in the last 72 hours. Thyroid Function Tests:  Recent Labs  05/19/16 1113  TSH 1.536   Anemia Panel:  Recent Labs  05/19/16 1113  VITAMINB12 1,079*   Sepsis Labs:  Recent Labs Lab 05/17/16 2251 05/18/16 0139  LATICACIDVEN 1.2 1.1    Recent Results (from the past 240 hour(s))  Culture, blood (routine x 2)     Status: None (Preliminary result)   Collection Time: 05/17/16 10:49 PM  Result Value Ref Range Status   Specimen Description BLOOD PICC  Final   Special Requests BOTTLES DRAWN AEROBIC AND ANAEROBIC 10CC EACH  Final   Culture NO GROWTH 4 DAYS  Final   Report Status PENDING  Incomplete  Culture, blood (routine x 2)     Status: None (Preliminary result)   Collection Time: 05/17/16 10:51  PM  Result Value Ref Range Status   Specimen Description BLOOD PICC  Final   Special Requests BOTTLES DRAWN AEROBIC AND ANAEROBIC 10CC EACH  Final   Culture NO GROWTH 4 DAYS  Final   Report Status PENDING  Incomplete     Radiology Studies: Ct Head Wo Contrast  Result Date: 05/19/2016 CLINICAL DATA:  Altered mental status. Hypertensive crisis. Cardiac arrest. Unable to wean from ventilator. EXAM: CT HEAD WITHOUT CONTRAST TECHNIQUE: Contiguous axial images were obtained from the base of the skull through the vertex without intravenous contrast. COMPARISON:  03/09/2016 FINDINGS: Motion artifact at the skullbase. Brain: There is no evidence of acute cortical infarct, intracranial hemorrhage, mass, midline shift, or extra-axial fluid collection. Mild cerebral atrophy is unchanged. Confluent cerebral white matter hypodensities are similar to the prior study and compatible with extensive chronic small vessel ischemic disease. There are chronic lacunar infarcts in the basal ganglia. Vascular: Calcified atherosclerosis at the skullbase. No hyperdense vessel. Skull: No fracture or focal osseous lesion. Sinuses/Orbits: Visualized paranasal sinuses and mastoid air cells are clear. Visualized orbits are grossly unremarkable. Other: None. IMPRESSION: 1. No evidence of acute intracranial abnormality. 2. Extensive chronic small vessel ischemic disease. Electronically Signed   By: Logan Bores M.D.   On: 05/19/2016 10:53    Scheduled Meds: . amiodarone  200 mg Oral Daily  . aspirin EC  81 mg Oral Daily  . chlorhexidine gluconate (MEDLINE KIT)  15 mL Mouth Rinse BID  . cloNIDine  0.1 mg Oral BID  . epoetin (EPOGEN/PROCRIT) injection  14,000 Units Intravenous Q T,Th,Sa-HD  . famotidine (PEPCID) IV  20 mg Intravenous Q24H  . hydrALAZINE  5 mg Intravenous Once  . mouth rinse  15 mL Mouth Rinse QID  . multivitamin  1 tablet Oral Daily  . piperacillin-tazobactam (ZOSYN)  IV  3.375 g Intravenous Q12H  .  polyethylene glycol  17 g Per Tube Daily  . vancomycin  500 mg Intravenous Q T,Th,Sa-HD   Continuous Infusions: . heparin 900 Units/hr (05/20/16 1027)     LOS: 12 days   Time spent: 25 minutes   Latori Beggs [facility] If 7PM-7AM, please contact night-coverage [email or web address] Password [facility] 05/21/2016, 9:44 AM

## 2016-05-21 NOTE — Progress Notes (Signed)
ANTICOAGULATION CONSULT NOTE - follow up  Pharmacy Consult for Heparin Indication: r/o  DVT  Patient Measurements: Height: _0  (154.9 cm) Weight: 120 lb 5.9 oz (54.6 kg) IBW/kg (Calculated) : 47.8  HEPARIN DW (KG): 53.7   Vital Signs: Temp: 97.9 F (36.6 C) (11/11 0718) Temp Source: Axillary (11/11 0718) BP: 142/101 (11/11 0500) Pulse Rate: 88 (11/11 0718)  Labs:  Recent Labs  05/19/16 0423 05/19/16 1113 05/20/16 0420 05/20/16 1625 05/21/16 0510  HGB 9.5*  --  8.4*  --  8.0*  HCT 29.9*  --  27.6*  --  26.7*  PLT 404*  --  368  --  395  HEPARINUNFRC 0.48  --  0.24* 0.35 0.49  CREATININE 9.94*  10.20*  --  8.74*  --  11.03*  CKTOTAL  --  33*  --   --   --   CKMB  --  1.5  --   --   --    Estimated Creatinine Clearance: 3.3 mL/min (by C-G formula based on SCr of 11.03 mg/dL (H)).  Medical History: Past Medical History:  Diagnosis Date  . Anxiety   . Arthritis   . AVM (arteriovenous malformation) of colon   . CAD (coronary artery disease)    a. 12/2011 NSTEMI/Cath/PCI LCX (2.25x14 Resolute DES) & D1 (2.25x22 Resolute DES);  b. 01/2012 Cath/PCI: LM 30, LAD 30p, 40-27m D1 stent ok, 99 in sm branch of diag, LCX patent stent, OM1 20, RCA 95 ost (4.0x12 Promus DES), EF 55%;  c. 04/2012 Lexi Cardiolite  EF 48%, small area of scar @ base/mid inflat wall with mild peri-infarct ischemia.; CABG 12/4  . Car[place] artery [place] ([place]    a. 616-10%LICA, 99/6045  . [place]   . Chronic diastolic CHF ([place]estive heart failure) ([place]    a. 02/2012 Echo EF 60-65%, nl wall motion, Gr 1 DD, mod MR  . [place]  . Esophageal stricture   . [place] ([place]    ESRD due to HTN, started dialysis 2011 [facility]  Access is LUA AVF as of Sept 2014.   .Marland KitchenGERD (gastroesophageal reflux disease)   . High cholesterol 12/2011  . History of blood transfusion 07/2011; 12/2011; 01/2012 X 2; 04/2012  . History of  gout   . History of lower GI bleeding   . Hypertension   . Iron deficiency anemia   . Mitral regurgitation    a. Moderate by echo, 02/2012  . Myocardial infarction   . [place]varian cancer [place] 1992  . Pneumonia ~ 2009  . PUD (peptic ulcer disease)   . TIA (transient ischemic attack)    Medications:  Scheduled:  . amiodarone  200 mg Oral Daily  . aspirin EC  81 mg Oral Daily  . chlorhexidine gluconate (MEDLINE KIT)  15 mL Mouth Rinse BID  . cloNIDine  0.1 mg Oral BID  . epoetin (EPOGEN/PROCRIT) injection  14,000 Units Intravenous Q T,Th,Sa-HD  . famotidine (PEPCID) IV  20 mg Intravenous Q24H  . hydrALAZINE  5 mg Intravenous Once  . mouth rinse  15 mL Mouth Rinse QID  . multivitamin  1 tablet Oral Daily  . piperacillin-tazobactam (ZOSYN)  IV  3.375 g Intravenous Q12H  . polyethylene glycol  17 g Per Tube Daily  . vancomycin  500 mg Intravenous Q T,Th,Sa-HD   Assessment: Venous dopplers reporting stable appearance of thrombus in the right basilic vein.  IV heparin until DVT ruled out.  Heparin level is therapeutic this am.  Hg trending down but no bleeding reported  Goal of Therapy:  Heparin level 0.3-0.7 units/ml Monitor platelets by anticoagulation protocol: Yes   Plan:  Cont heparin infusion at 900 units/hr Heparin level and CBC daily Monitor for s/s of bleeding   Thanks for allowing pharmacy to be a part of this patient's care.  Excell Seltzer, PharmD Clinical Pharmacist  05/21/2016,8:36 AM

## 2016-05-21 NOTE — Progress Notes (Signed)
Subjective: She was able to be extubated yesterday and has remained off the ventilator. No new complaints. No complaints of chest pain nausea vomiting diarrhea. She is somewhat short of breath.  Objective: Vital signs in last 24 hours: Temp:  [96.7 F (35.9 C)-98.1 F (36.7 C)] 97.9 F (36.6 C) (11/11 0718) Pulse Rate:  [86-97] 88 (11/11 0718) Resp:  [13-31] 27 (11/11 0718) BP: (101-172)/(67-127) 142/101 (11/11 0500) SpO2:  [95 %-100 %] 100 % (11/11 0718) FiO2 (%):  [30 %] 30 % (11/10 1029) Weight:  [54.6 kg (120 lb 5.9 oz)] 54.6 kg (120 lb 5.9 oz) (11/11 0400) Weight change: -0.9 kg (-1 lb 15.7 oz) Last BM Date: 05/20/16  Intake/Output from previous day: 11/10 0701 - 11/11 0700 In: 405.4 [I.V.:205.4; IV Piggyback:200] Out: -   PHYSICAL EXAM General appearance: alert, cooperative and mild distress Resp: rhonchi Right greater than left Cardio: regular rate and rhythm, S1, S2 normal, no murmur, click, rub or gallop GI: soft, non-tender; bowel sounds normal; no masses,  no organomegaly Extremities: extremities normal, atraumatic, no cyanosis or edema Skin warm and dry. Pupils react  Lab Results:  Results for orders placed or performed during the hospital encounter of 05/08/16 (from the past 48 hour(s))  Vitamin B12     Status: Abnormal   Collection Time: 05/19/16 11:13 AM  Result Value Ref Range   Vitamin B-12 1,079 (H) 180 - 914 pg/mL    Comment: (NOTE) This assay is not validated for testing neonatal or myeloproliferative syndrome specimens for Vitamin B12 levels. Performed at [facility]   Homocysteine     Status: None   Collection Time: 05/19/16 11:13 AM  Result Value Ref Range   Homocysteine 9.5 0.0 - 15.0 umol/L    Comment: (NOTE) Performed At: [facility] [place], [place] [id number] Lindon Romp MD [id number]   TSH     Status: None   Collection Time: 05/19/16 11:13 AM  Result Value Ref Range   TSH 1.536 0.350 - 4.500  uIU/mL    Comment: Performed by a 3rd Generation assay with a functional sensitivity of <=0.01 uIU/mL.  RPR     Status: None   Collection Time: 05/19/16 11:13 AM  Result Value Ref Range   RPR Ser Ql Non Reactive Non Reactive    Comment: (NOTE) Performed At: [facility] [place], [place] [id number] Lindon Romp MD [id number]   Sedimentation rate     Status: Abnormal   Collection Time: 05/19/16 11:13 AM  Result Value Ref Range   Sed Rate 99 (H) 0 - 22 mm/hr  C-reactive protein     Status: Abnormal   Collection Time: 05/19/16 11:13 AM  Result Value Ref Range   CRP 14.2 (H) <1.0 mg/dL    Comment: Performed at [facility]  HIV antibody (routine testing) (NOT for [facility])     Status: None   Collection Time: 05/19/16 11:13 AM  Result Value Ref Range   HIV Screen 4th Generation wRfx Non Reactive Non Reactive    Comment: (NOTE) Performed At: [facility] [place], [place] [id number] Lindon Romp MD [id number]   CK total and CKMB (cardiac)not at [facility]     Status: Abnormal   Collection Time: 05/19/16 11:13 AM  Result Value Ref Range   Total CK 33 (L) 38 - 234 U/L   CK, MB 1.5 0.5 - 5.0 ng/mL   Relative Index RELATIVE INDEX IS INVALID 0.0 - 2.5  Comment: WHEN CK < 100 U/L        Performed at [facility]   Glucose, capillary     Status: Abnormal   Collection Time: 05/19/16  4:45 PM  Result Value Ref Range   Glucose-Capillary 122 (H) 65 - 99 mg/dL   Comment 1 Notify RN    Comment 2 Document in Chart   Glucose, capillary     Status: Abnormal   Collection Time: 05/19/16  7:24 PM  Result Value Ref Range   Glucose-Capillary 112 (H) 65 - 99 mg/dL  Glucose, capillary     Status: Abnormal   Collection Time: 05/20/16 12:18 AM  Result Value Ref Range   Glucose-Capillary 107 (H) 65 - 99 mg/dL  Heparin level (unfractionated)     Status: Abnormal   Collection Time: 05/20/16  4:20 AM  Result Value Ref Range    Heparin Unfractionated 0.24 (L) 0.30 - 0.70 IU/mL    Comment:        IF HEPARIN RESULTS ARE BELOW EXPECTED VALUES, AND PATIENT DOSAGE HAS BEEN CONFIRMED, SUGGEST FOLLOW UP TESTING OF ANTITHROMBIN III LEVELS.   CBC     Status: Abnormal   Collection Time: 05/20/16  4:20 AM  Result Value Ref Range   WBC 18.2 (H) 4.0 - 10.5 K/uL   RBC 2.75 (L) 3.87 - 5.11 MIL/uL   Hemoglobin 8.4 (L) 12.0 - 15.0 g/dL   HCT 87.1 (L) 95.9 - 74.7 %   MCV 100.4 (H) 78.0 - 100.0 fL   MCH 30.5 26.0 - 34.0 pg   MCHC 30.4 30.0 - 36.0 g/dL   RDW 18.5 (H) 50.1 - 58.6 %   Platelets 368 150 - 400 K/uL  Renal function panel     Status: Abnormal   Collection Time: 05/20/16  4:20 AM  Result Value Ref Range   Sodium 141 135 - 145 mmol/L   Potassium 3.5 3.5 - 5.1 mmol/L   Chloride 100 (L) 101 - 111 mmol/L   CO2 26 22 - 32 mmol/L   Glucose, Bld 164 (H) 65 - 99 mg/dL   BUN 64 (H) 6 - 20 mg/dL   Creatinine, Ser 8.25 (H) 0.44 - 1.00 mg/dL   Calcium 9.4 8.9 - 74.9 mg/dL   Phosphorus 5.1 (H) 2.5 - 4.6 mg/dL   Albumin 2.9 (L) 3.5 - 5.0 g/dL   GFR calc non Af Amer 4 (L) >60 mL/min   GFR calc Af Amer 4 (L) >60 mL/min    Comment: (NOTE) The eGFR has been calculated using the CKD EPI equation. This calculation has not been validated in all clinical situations. eGFR's persistently <60 mL/min signify possible Chronic Kidney Disease.    Anion gap 15 5 - 15  Glucose, capillary     Status: Abnormal   Collection Time: 05/20/16  5:07 AM  Result Value Ref Range   Glucose-Capillary 147 (H) 65 - 99 mg/dL  Blood gas, arterial     Status: Abnormal   Collection Time: 05/20/16  6:18 AM  Result Value Ref Range   FIO2 30.00    Delivery systems VENTILATOR    Mode PRESSURE REGULATED VOLUME CONTROL    VT 380 mL   LHR 14 resp/min   Peep/cpap 5.0 cm H20   pH, Arterial 7.418 7.350 - 7.450   pCO2 arterial 41.9 32.0 - 48.0 mmHg   pO2, Arterial 112 (H) 83.0 - 108.0 mmHg   Bicarbonate 26.5 20.0 - 28.0 mmol/L   Acid-Base Excess 2.4  (H) 0.0 -  2.0 mmol/L   O2 Saturation 96.8 %   Patient temperature 37.0    Collection site RIGHT RADIAL    Drawn by [phone]    Sample type ARTERIAL DRAW    Allens test (pass/fail) POSITIVE (A) PASS  Glucose, capillary     Status: Abnormal   Collection Time: 05/20/16  7:36 AM  Result Value Ref Range   Glucose-Capillary 145 (H) 65 - 99 mg/dL   Comment 1 Notify RN    Comment 2 Document in Chart   Glucose, capillary     Status: Abnormal   Collection Time: 05/20/16 12:22 PM  Result Value Ref Range   Glucose-Capillary 136 (H) 65 - 99 mg/dL   Comment 1 Notify RN    Comment 2 Document in Chart   Heparin level (unfractionated)     Status: None   Collection Time: 05/20/16  4:25 PM  Result Value Ref Range   Heparin Unfractionated 0.35 0.30 - 0.70 IU/mL    Comment:        IF HEPARIN RESULTS ARE BELOW EXPECTED VALUES, AND PATIENT DOSAGE HAS BEEN CONFIRMED, SUGGEST FOLLOW UP TESTING OF ANTITHROMBIN III LEVELS.   Glucose, capillary     Status: Abnormal   Collection Time: 05/20/16  4:50 PM  Result Value Ref Range   Glucose-Capillary 125 (H) 65 - 99 mg/dL   Comment 1 Notify RN    Comment 2 Document in Chart   Glucose, capillary     Status: Abnormal   Collection Time: 05/20/16  8:20 PM  Result Value Ref Range   Glucose-Capillary 113 (H) 65 - 99 mg/dL   Comment 1 Notify RN    Comment 2 Document in Chart   Glucose, capillary     Status: Abnormal   Collection Time: 05/20/16 11:54 PM  Result Value Ref Range   Glucose-Capillary 103 (H) 65 - 99 mg/dL   Comment 1 Notify RN    Comment 2 Document in Chart   Glucose, capillary     Status: Abnormal   Collection Time: 05/21/16  4:20 AM  Result Value Ref Range   Glucose-Capillary 104 (H) 65 - 99 mg/dL   Comment 1 Notify RN    Comment 2 Document in Chart   Heparin level (unfractionated)     Status: None   Collection Time: 05/21/16  5:10 AM  Result Value Ref Range   Heparin Unfractionated 0.49 0.30 - 0.70 IU/mL    Comment:        IF HEPARIN  RESULTS ARE BELOW EXPECTED VALUES, AND PATIENT DOSAGE HAS BEEN CONFIRMED, SUGGEST FOLLOW UP TESTING OF ANTITHROMBIN III LEVELS.   CBC     Status: Abnormal   Collection Time: 05/21/16  5:10 AM  Result Value Ref Range   WBC 16.9 (H) 4.0 - 10.5 K/uL   RBC 2.62 (L) 3.87 - 5.11 MIL/uL   Hemoglobin 8.0 (L) 12.0 - 15.0 g/dL   HCT 42.7 (L) 67.0 - 11.0 %   MCV 101.9 (H) 78.0 - 100.0 fL   MCH 30.5 26.0 - 34.0 pg   MCHC 30.0 30.0 - 36.0 g/dL   RDW 03.4 (H) 96.1 - 16.4 %   Platelets 395 150 - 400 K/uL  Renal function panel     Status: Abnormal   Collection Time: 05/21/16  5:10 AM  Result Value Ref Range   Sodium 142 135 - 145 mmol/L   Potassium 4.6 3.5 - 5.1 mmol/L    Comment: DELTA CHECK NOTED   Chloride 101 101 - 111 mmol/L  CO2 24 22 - 32 mmol/L   Glucose, Bld 111 (H) 65 - 99 mg/dL   BUN 86 (H) 6 - 20 mg/dL   Creatinine, Ser 11.03 (H) 0.44 - 1.00 mg/dL   Calcium 10.0 8.9 - 10.3 mg/dL   Phosphorus 8.4 (H) 2.5 - 4.6 mg/dL   Albumin 2.8 (L) 3.5 - 5.0 g/dL   GFR calc non Af Amer 3 (L) >60 mL/min   GFR calc Af Amer 3 (L) >60 mL/min    Comment: (NOTE) The eGFR has been calculated using the CKD EPI equation. This calculation has not been validated in all clinical situations. eGFR's persistently <60 mL/min signify possible Chronic Kidney Disease.    Anion gap 17 (H) 5 - 15  Glucose, capillary     Status: None   Collection Time: 05/21/16  7:16 AM  Result Value Ref Range   Glucose-Capillary 94 65 - 99 mg/dL    ABGS  Recent Labs  05/20/16 0618  PHART 7.418  PO2ART 112*  HCO3 26.5   CULTURES Recent Results (from the past 240 hour(s))  Culture, blood (routine x 2)     Status: None (Preliminary result)   Collection Time: 05/17/16 10:49 PM  Result Value Ref Range Status   Specimen Description BLOOD PICC  Final   Special Requests BOTTLES DRAWN AEROBIC AND ANAEROBIC 10CC EACH  Final   Culture NO GROWTH 4 DAYS  Final   Report Status PENDING  Incomplete  Culture, blood (routine  x 2)     Status: None (Preliminary result)   Collection Time: 05/17/16 10:51 PM  Result Value Ref Range Status   Specimen Description BLOOD PICC  Final   Special Requests BOTTLES DRAWN AEROBIC AND ANAEROBIC 10CC EACH  Final   Culture NO GROWTH 4 DAYS  Final   Report Status PENDING  Incomplete   Studies/Results: Ct Head Wo Contrast  Result Date: 05/19/2016 CLINICAL DATA:  Altered mental status. Hypertensive crisis. Cardiac arrest. Unable to wean from ventilator. EXAM: CT HEAD WITHOUT CONTRAST TECHNIQUE: Contiguous axial images were obtained from the base of the skull through the vertex without intravenous contrast. COMPARISON:  03/09/2016 FINDINGS: Motion artifact at the skullbase. Brain: There is no evidence of acute cortical infarct, intracranial hemorrhage, mass, midline shift, or extra-axial fluid collection. Mild cerebral atrophy is unchanged. Confluent cerebral white matter hypodensities are similar to the prior study and compatible with extensive chronic small vessel ischemic disease. There are chronic lacunar infarcts in the basal ganglia. Vascular: Calcified atherosclerosis at the skullbase. No hyperdense vessel. Skull: No fracture or focal osseous lesion. Sinuses/Orbits: Visualized paranasal sinuses and mastoid air cells are clear. Visualized orbits are grossly unremarkable. Other: None. IMPRESSION: 1. No evidence of acute intracranial abnormality. 2. Extensive chronic small vessel ischemic disease. Electronically Signed   By: Logan Bores M.D.   On: 05/19/2016 10:53    Medications:  Prior to Admission:  Prescriptions Prior to Admission  Medication Sig Dispense Refill Last Dose  . ALPRAZolam (XANAX) 0.25 MG tablet Take 1-2 tablets by mouth at bedtime as needed for sleep.    05/08/2016 at Unknown time  . amiodarone (PACERONE) 200 MG tablet Take 1 tablet (200 mg total) by mouth daily. 30 tablet 3 05/08/2016 at Unknown time  . aspirin EC 81 MG tablet Take 81 mg by mouth daily.    05/08/2016  at Unknown time  . cloNIDine (CATAPRES) 0.1 MG tablet Take 1 tablet (0.1 mg total) by mouth 2 (two) times daily. 60 tablet 11 05/08/2016 at Unknown  time  . epoetin alfa (EPOGEN,PROCRIT) 43329 UNIT/ML injection Inject 1 mL (10,000 Units total) into the vein every Monday, Wednesday, and Friday with hemodialysis. 1 mL 10 05/06/2016 at Unknown time  . fluticasone (FLONASE) 50 MCG/ACT nasal spray Place 2 sprays into the nose daily as needed for allergies.    unknown  . labetalol (NORMODYNE) 200 MG tablet Take 1 tablet (200 mg total) by mouth 2 (two) times daily. 90 tablet 1 05/08/2016 at Unknown time  . lanthanum (FOSRENOL) 1000 MG chewable tablet Chew 1 tablet (1,000 mg total) by mouth 3 (three) times daily with meals. 90 tablet 1 05/08/2016 at Unknown time  . multivitamin (RENA-VIT) TABS tablet Take 1 tablet by mouth daily.   05/08/2016 at Unknown time  . nitroGLYCERIN (NITROSTAT) 0.4 MG SL tablet Place 1 tablet (0.4 mg total) under the tongue every 5 (five) minutes as needed for chest pain. 25 tablet 3 05/08/2016 at Unknown time  . omeprazole (PRILOSEC) 20 MG capsule TAKE ONE CAPSULE BY MOUTH ONCE DAILY 30 capsule 5 05/08/2016 at Unknown time  . PROAIR HFA 108 (90 BASE) MCG/ACT inhaler Inhale 1 puff into the lungs every 6 (six) hours as needed for wheezing or shortness of breath.    unknown  . SENSIPAR 30 MG tablet Take 30 mg by mouth daily with supper.    05/08/2016 at Unknown time  . simvastatin (ZOCOR) 20 MG tablet Take 1 tablet (20 mg total) by mouth at bedtime. 90 tablet 3 05/07/2016 at Unknown time   Scheduled: . amiodarone  200 mg Oral Daily  . aspirin EC  81 mg Oral Daily  . chlorhexidine gluconate (MEDLINE KIT)  15 mL Mouth Rinse BID  . cloNIDine  0.1 mg Oral BID  . epoetin (EPOGEN/PROCRIT) injection  14,000 Units Intravenous Q T,Th,Sa-HD  . famotidine (PEPCID) IV  20 mg Intravenous Q24H  . hydrALAZINE  5 mg Intravenous Once  . mouth rinse  15 mL Mouth Rinse QID  . multivitamin  1 tablet  Oral Daily  . piperacillin-tazobactam (ZOSYN)  IV  3.375 g Intravenous Q12H  . polyethylene glycol  17 g Per Tube Daily  . vancomycin  500 mg Intravenous Q T,Th,Sa-HD   Continuous: . heparin 900 Units/hr (05/20/16 1027)   JJO:ACZYSA chloride, sodium chloride, sodium chloride, sodium chloride, sodium chloride, sodium chloride, sodium chloride, sodium chloride, acetaminophen, acetaminophen, ALPRAZolam, alteplase, alteplase, fentaNYL (SUBLIMAZE) injection, fentaNYL (SUBLIMAZE) injection, fluticasone, heparin, heparin, ipratropium-albuterol, lidocaine (PF), lidocaine-prilocaine, midazolam, ondansetron (ZOFRAN) IV, pentafluoroprop-tetrafluoroeth  Assesment: She was admitted with hypertensive crisis. She has coronary disease. She had CODE BLUE 2 and had been on the ventilator for about 10 days but was able to be successfully extubated yesterday. She has remained off the ventilator.  At baseline she has coronary disease, severe hypertension, end-stage renal disease on dialysis and heart failure.  She is on IV antibiotics Principal Problem:   Hypertensive crisis Active Problems:   Coronary atherosclerosis of native coronary artery   ESRD on hemodialysis (HCC)   Anemia of chronic kidney failure   Chronic diastolic CHF (congestive heart failure) (HCC)   Chest pain   Acute respiratory failure ([place])   Hypertensive crisis without congestive heart failure   Cardiac arrest [facility])   Palliative care encounter   DNR (do not resuscitate) discussion   Goals of care, counseling/discussion    Plan: Continue treatments. Watch respiratory status closely    LOS: 12 days   Lillyonna Armstead L 05/21/2016, 8:59 AM

## 2016-05-22 LAB — CBC
HEMATOCRIT: 26 % — AB (ref 36.0–46.0)
HEMOGLOBIN: 7.9 g/dL — AB (ref 12.0–15.0)
MCH: 30.5 pg (ref 26.0–34.0)
MCHC: 30.4 g/dL (ref 30.0–36.0)
MCV: 100.4 fL — AB (ref 78.0–100.0)
Platelets: 440 10*3/uL — ABNORMAL HIGH (ref 150–400)
RBC: 2.59 MIL/uL — ABNORMAL LOW (ref 3.87–5.11)
RDW: 17.1 % — ABNORMAL HIGH (ref 11.5–15.5)
WBC: 17.4 10*3/uL — ABNORMAL HIGH (ref 4.0–10.5)

## 2016-05-22 LAB — CULTURE, BLOOD (ROUTINE X 2)
CULTURE: NO GROWTH
CULTURE: NO GROWTH

## 2016-05-22 LAB — HEPARIN LEVEL (UNFRACTIONATED): Heparin Unfractionated: 0.62 IU/mL (ref 0.30–0.70)

## 2016-05-22 LAB — GLUCOSE, CAPILLARY
GLUCOSE-CAPILLARY: 77 mg/dL (ref 65–99)
GLUCOSE-CAPILLARY: 82 mg/dL (ref 65–99)
Glucose-Capillary: 88 mg/dL (ref 65–99)
Glucose-Capillary: 90 mg/dL (ref 65–99)
Glucose-Capillary: 91 mg/dL (ref 65–99)
Glucose-Capillary: 96 mg/dL (ref 65–99)

## 2016-05-22 NOTE — Progress Notes (Addendum)
PROGRESS NOTE    Shawna [name]  IWO:[id number] DOB: 1941/05/28 DOA: 05/08/2016 PCP: [name], MD    Brief Narrative:  42 yof with a history of CAD and ESRD, presented with acute onset of chest pain unrelieved by aspirin and nitroglycerin. In the emergency department BP 230/130 and she was noted to have new ST depression. She was started on nitroglycerin infusion and IV labetalol and admitted for hypertensive crisis with chest pain.Her chest pain had resolved but she remained hypertensive. On 11/1 patient suddenly became bradycardic and unresponsive. She underwent CPR, chest compressions, received epinephrine and had ROSC. On 11/2 prior to starting her dialysis she was noted to be hypertensive so she was given IV labetalol and developed bradycardia with subsequent PEA arrest. She underwent CPR again for < 67mns and had ROSC.  Weaning has been difficult due to periods of apnea. Pulmonology and Neurology assisting.  Assessment & Plan:   Principal Problem:   Hypertensive crisis Active Problems:   Coronary atherosclerosis of native coronary artery   ESRD on hemodialysis (HCC)   Anemia of chronic kidney failure   Chronic diastolic CHF (congestive heart failure) (HCC)   Chest pain   Acute respiratory failure ([place]   Hypertensive crisis without congestive heart failure   Cardiac arrest ([facility]   Palliative care encounter   DNR (do not resuscitate) discussion   Goals of care, counseling/discussion  1. Bradycardia with PEA cardiac arrest. On 11/1, patient's heart rate became bradycardic into PEA. She underwent CPR, received epinephrine, bicarbonate and calcium chloride. She had ROSC after approximately 25 mins of CPR. Lab work had indicated an elevated potassium of 6.4, which may have been cause. She was brieflystarted on a epinephrine infusion which was quicklyweaned off. CXR did not show any acute process. Cardiology following. Resolved 2. Acute respiratory failure: Ventilator dependent.  Patient intubated while undergoing CPR. Pulmonology following. She is undergoing daily weaning trials. She remains hemodynamically stable. Weaning has been unsuccessful due to intermittent periods of apnea. Neurology on board. Pt currently extubated. Will continue to monitor in ICU 3. Hypertensive crisis with chest pain. Chest pain resolved. Cards has been on board. Blood pressures have been labile. Patient has periods where her blood pressures are high and then periods where they are soft. I do not want to be overly aggressive with her blood pressures as she has seen drops in her blood pressures as well. As such will continue prn hydralazine. 4. Elevated troponin. Patient has a history of CAD status post CABG and stenting. Patient had no evidence of ACS on admission. Since that time, her troponin has started to rise. Cardiology following and felt that this may be demand ischemia/related to CPR. Troponin peak was 1.5 and has since trended down since.  5. ESRD on HD MWF. Nephrology is following for dialysis needs.  6. Chronic diastolic congestive heart failure. Patient has an echocardiogram on 09/2015 with an EF of 650-03% grade 2 diastolic dysfunction. Continue fluid management with dialysis. 7. PSVT, maintained on amiodarone. EKG remains in sinus rhythm.  8. Myoclonus. Suspect secondary to CKD/ESRD on HD. No history or objective findings to suggest acute CNS process. Reviewed meds, no likely offenders. Neurology on board. 9. Hyperkalemia. Resolved.  10. Elevated TSH. Recheck as an outpatient. 11. Anemia of chronic kidney disease. Hemoglobin currently stable. Will continue to monitor. 12. Right hand swelling. Started on IV antibiotics due to concern of cellulitis. Xray of hand does not show any gas. Venous dopplers reporting stable appearance of thrombus in the  right basilic vein. on IV heparin  DVT prophylaxis: Heparin infusion Code Status: FULL  Family Communication: husband at bedside Disposition  Plan: pending clinical improvement    Consultants:   Nephrology   Cardiology  Pulmonology  Nutrition   Neurology  Procedures:   Intubation 11/1>>  Antimicrobials  Vancomycin 11/7>>  Zosyn 11/7>>   Subjective: Pt is extubated. Does not converse with examiner. Shakes her head no to questions asked  Objective: Vitals:   05/22/16 0500 05/22/16 0530 05/22/16 0600 05/22/16 0718  BP: (!) 159/79  105/86   Pulse: 100 99 98   Resp: (!) 21 (!) 22 (!) 25   Temp:    97.8 F (36.6 C)  TempSrc:      SpO2: 100% 100% 100%   Weight:      Height:        Intake/Output Summary (Last 24 hours) at 05/22/16 0844 Last data filed at 05/22/16 0600  Gross per 24 hour  Intake              475 ml  Output              114 ml  Net              361 ml   Filed Weights   05/20/16 0500 05/21/16 0400 05/21/16 1940  Weight: 55 kg (121 lb 4.1 oz) 54.6 kg (120 lb 5.9 oz) 55.1 kg (121 lb 7.6 oz)    Examination:  General exam: Appears calm and comfortable, in nad. Respiratory system: Clear to auscultation. Respiratory effort normal. Cardiovascular system: S1 & S2 heard, RRR. No JVD, murmurs, rubs, gallops or clicks. No pedal edema. Gastrointestinal system: Abdomen is nondistended, soft and nontender. No organomegaly or masses felt. Normal bowel sounds heard. Central nervous system: limited non verbal responses to examiner. No facial asymmetry Extremities: right hand is edematous. Pulses are palpable. Erythema and warmth noted over hand. Skin: No rashes, lesions or ulcers Psychiatry: cannot assess    Data Reviewed: I have personally reviewed following labs and imaging studies  CBC:  Recent Labs Lab 05/18/16 1835 05/19/16 0423 05/20/16 0420 05/21/16 0510 05/22/16 0520  WBC 15.8* 19.0* 18.2* 16.9* 17.4*  HGB 9.7* 9.5* 8.4* 8.0* 7.9*  HCT 30.8* 29.9* 27.6* 26.7* 26.0*  MCV 98.4 98.7 100.4* 101.9* 100.4*  PLT 392 404* 368 395 621*   Basic Metabolic Panel:  Recent Labs Lab  05/18/16 0139 05/18/16 1835 05/19/16 0423 05/20/16 0420 05/21/16 0510  NA 137 138 137  138 141 142  K 3.6 3.9 4.1  4.1 3.5 4.6  CL 97* 96* 95*  96* 100* 101  CO2 26 26 25  26 26 24   GLUCOSE 170* 146* 181*  181* 164* 111*  BUN 43* 60* 70*  71* 64* 86*  CREATININE 7.28* 9.13* 9.94*  10.20* 8.74* 11.03*  CALCIUM 9.0 9.2 8.9  9.0 9.4 10.0  PHOS  --  8.2* 8.0* 5.1* 8.4*   GFR: Estimated Creatinine Clearance: 3.3 mL/min (by C-G formula based on SCr of 11.03 mg/dL (H)). Liver Function Tests:  Recent Labs Lab 05/18/16 1835 05/19/16 0423 05/20/16 0420 05/21/16 0510  AST  --  36  --   --   ALT  --  178*  --   --   ALKPHOS  --  75  --   --   BILITOT  --  0.9  --   --   PROT  --  6.4*  --   --   ALBUMIN 3.0*  2.8*  2.8* 2.9* 2.8*   No results for input(s): LIPASE, AMYLASE in the last 168 hours. No results for input(s): AMMONIA in the last 168 hours. Coagulation Profile: No results for input(s): INR, PROTIME in the last 168 hours. Cardiac Enzymes:  Recent Labs Lab 05/19/16 1113  CKTOTAL 33*  CKMB 1.5   BNP (last 3 results) No results for input(s): PROBNP in the last 8760 hours. HbA1C: No results for input(s): HGBA1C in the last 72 hours. CBG:  Recent Labs Lab 05/21/16 1615 05/21/16 1943 05/22/16 0017 05/22/16 0354 05/22/16 0718  GLUCAP 88 92 88 96 77   Lipid Profile: No results for input(s): CHOL, HDL, LDLCALC, TRIG, CHOLHDL, LDLDIRECT in the last 72 hours. Thyroid Function Tests:  Recent Labs  05/19/16 1113  TSH 1.536   Anemia Panel:  Recent Labs  05/19/16 1113  VITAMINB12 1,079*   Sepsis Labs:  Recent Labs Lab 05/17/16 2251 05/18/16 0139  LATICACIDVEN 1.2 1.1    Recent Results (from the past 240 hour(s))  Culture, blood (routine x 2)     Status: None   Collection Time: 05/17/16 10:49 PM  Result Value Ref Range Status   Specimen Description BLOOD PICC  Final   Special Requests BOTTLES DRAWN AEROBIC AND ANAEROBIC 10CC EACH  Final    Culture NO GROWTH 5 DAYS  Final   Report Status 05/22/2016 FINAL  Final  Culture, blood (routine x 2)     Status: None   Collection Time: 05/17/16 10:51 PM  Result Value Ref Range Status   Specimen Description BLOOD PICC  Final   Special Requests BOTTLES DRAWN AEROBIC AND ANAEROBIC 10CC EACH  Final   Culture NO GROWTH 5 DAYS  Final   Report Status 05/22/2016 FINAL  Final     Radiology Studies: No results found.  Scheduled Meds: . amiodarone  200 mg Oral Daily  . aspirin EC  81 mg Oral Daily  . chlorhexidine gluconate (MEDLINE KIT)  15 mL Mouth Rinse BID  . cloNIDine  0.1 mg Oral BID  . epoetin (EPOGEN/PROCRIT) injection  14,000 Units Intravenous Q T,Th,Sa-HD  . famotidine (PEPCID) IV  20 mg Intravenous Q24H  . hydrALAZINE  5 mg Intravenous Once  . mouth rinse  15 mL Mouth Rinse QID  . multivitamin  1 tablet Oral Daily  . piperacillin-tazobactam (ZOSYN)  IV  3.375 g Intravenous Q12H  . polyethylene glycol  17 g Per Tube Daily  . vancomycin  500 mg Intravenous Q T,Th,Sa-HD   Continuous Infusions: . heparin 900 Units/hr (05/21/16 2001)     LOS: 13 days   Time spent: 25 minutes   Lilyona Richner [facility] If 7PM-7AM, please contact night-coverage [email or web address] Password [facility] 05/22/2016, 8:44 AM

## 2016-05-22 NOTE — Procedures (Signed)
   HEMODIALYSIS TREATMENT NOTE:  4 hour dialysis session completed via left upper arm AVF (15g/antegrade). Goal NOT met:  Ultrafiltration was interrupted x 3 hours of treatment due to hypotension.  Net UF only 116cc. Pt either restless or calm with blank stare throughout HD session.  No verbalization or response to queries but she was able to shake her head (no) to deny pain.  All blood was returned and hemostasis was achieved within 15 minutes.  Report given to Thayer Headings, [place].  Rockwell Alexandria, RN, CDN

## 2016-05-22 NOTE — Progress Notes (Signed)
Subjective: Interval History: Patient alert but doesn't communicate that  much.  patient normally talks a lot . She offers no complaints.  Objective: Vital signs in last 24 hours: Temp:  [97 F (36.1 C)-98.3 F (36.8 C)] 97.8 F (36.6 C) (11/12 0718) Pulse Rate:  [37-108] 98 (11/12 0600) Resp:  [18-30] 25 (11/12 0600) BP: (89-201)/(27-98) 105/86 (11/12 0600) SpO2:  [100 %] 100 % (11/12 0600) Weight:  [55.1 kg (121 lb 7.6 oz)] 55.1 kg (121 lb 7.6 oz) (11/11 1940) Weight change: 0.5 kg (1 lb 1.6 oz)  Intake/Output from previous day: 11/11 0701 - 11/12 0700 In: 475 [I.V.:225; IV Piggyback:250] Out: 114  Intake/Output this shift: No intake/output data recorded.  Patient is alert and in no apparent distress Chest: She has bilateral inspiratory crackles. Heart: RRR  Extremties: No edema  Lab Results:  Recent Labs  05/21/16 0510 05/22/16 0520  WBC 16.9* 17.4*  HGB 8.0* 7.9*  HCT 26.7* 26.0*  PLT 395 440*   BMET:   Recent Labs  05/20/16 0420 05/21/16 0510  NA 141 142  K 3.5 4.6  CL 100* 101  CO2 26 24  GLUCOSE 164* 111*  BUN 64* 86*  CREATININE 8.74* 11.03*  CALCIUM 9.4 10.0   No results for input([name]): PTH in the last 72 hours. Iron Studies: No results for input([name]): IRON, TIBC, TRANSFERRIN, FERRITIN in the last 72 hours.  Studies/Results: No results found.  I have reviewed the patient'[name] current medications.  Assessment/Plan: Problem #1 patient with episode of bradycardia/unresponsive/status post CODE BLUE patient was initially intubated But presently extubated. Problem #2 hypertension: Her blood pressure continue to fluctuate which is normal for her. Her blood pressure ranges between 120'[name] up to 170. Patient becomes symptomatic with normal low blood pressure. Problem #3 end-stage renal disease: She is status post hemodialysis yesterday. Problem #4 anemia: Her hemoglobin is low and decling. Presently she is getting Epogen during dialysis. Problem #5 fluid  management: No significant sign of fluid overload. Problem #6 metabolic bone disease: Her calcium is normal but her phosphorus is high. Not on Binder as patient was intubated and not on oral feeding Plan: 1] patient doesn't require dialysis today. 2] will check renal panel in the morning and determine whether patient needs dialysis tomorrow. 3] Continue with Epogen with dialysis.   LOS: 13 days   Shawna [name] 05/22/2016,9:00 AM

## 2016-05-22 NOTE — Progress Notes (Signed)
Subjective: She is awake and alert. No new complaints. No chest pain nausea vomiting diarrhea shortness of breath or cough.  Objective: Vital signs in last 24 hours: Temp:  [97 F (36.1 C)-98.3 F (36.8 C)] 97.8 F (36.6 C) (11/12 0718) Pulse Rate:  [37-108] 99 (11/12 0900) Resp:  [18-30] 24 (11/12 0900) BP: (89-201)/(27-98) 142/74 (11/12 0900) SpO2:  [100 %] 100 % (11/12 0900) Weight:  [55.1 kg (121 lb 7.6 oz)] 55.1 kg (121 lb 7.6 oz) (11/11 1940) Weight change: 0.5 kg (1 lb 1.6 oz) Last BM Date: 05/20/16  Intake/Output from previous day: 11/11 0701 - 11/12 0700 In: 475 [I.V.:225; IV Piggyback:250] Out: 114   PHYSICAL EXAM General appearance: alert, cooperative and no distress Resp: rhonchi bilaterally Cardio: regular rate and rhythm, S1, S2 normal, no murmur, click, rub or gallop GI: soft, non-tender; bowel sounds normal; no masses,  no organomegaly Extremities: extremities normal, atraumatic, no cyanosis or edema Skin warm and dry. Pupils reactive. Mucous membranes are moist  Lab Results:  Results for orders placed or performed during the hospital encounter of 05/08/16 (from the past 48 hour(s))  Glucose, capillary     Status: Abnormal   Collection Time: 05/20/16 12:22 PM  Result Value Ref Range   Glucose-Capillary 136 (H) 65 - 99 mg/dL   Comment 1 Notify RN    Comment 2 Document in Chart   Heparin level (unfractionated)     Status: None   Collection Time: 05/20/16  4:25 PM  Result Value Ref Range   Heparin Unfractionated 0.35 0.30 - 0.70 IU/mL    Comment:        IF HEPARIN RESULTS ARE BELOW EXPECTED VALUES, AND PATIENT DOSAGE HAS BEEN CONFIRMED, SUGGEST FOLLOW UP TESTING OF ANTITHROMBIN III LEVELS.   Glucose, capillary     Status: Abnormal   Collection Time: 05/20/16  4:50 PM  Result Value Ref Range   Glucose-Capillary 125 (H) 65 - 99 mg/dL   Comment 1 Notify RN    Comment 2 Document in Chart   Glucose, capillary     Status: Abnormal   Collection Time:  05/20/16  8:20 PM  Result Value Ref Range   Glucose-Capillary 113 (H) 65 - 99 mg/dL   Comment 1 Notify RN    Comment 2 Document in Chart   Glucose, capillary     Status: Abnormal   Collection Time: 05/20/16 11:54 PM  Result Value Ref Range   Glucose-Capillary 103 (H) 65 - 99 mg/dL   Comment 1 Notify RN    Comment 2 Document in Chart   Glucose, capillary     Status: Abnormal   Collection Time: 05/21/16  4:20 AM  Result Value Ref Range   Glucose-Capillary 104 (H) 65 - 99 mg/dL   Comment 1 Notify RN    Comment 2 Document in Chart   Heparin level (unfractionated)     Status: None   Collection Time: 05/21/16  5:10 AM  Result Value Ref Range   Heparin Unfractionated 0.49 0.30 - 0.70 IU/mL    Comment:        IF HEPARIN RESULTS ARE BELOW EXPECTED VALUES, AND PATIENT DOSAGE HAS BEEN CONFIRMED, SUGGEST FOLLOW UP TESTING OF ANTITHROMBIN III LEVELS.   CBC     Status: Abnormal   Collection Time: 05/21/16  5:10 AM  Result Value Ref Range   WBC 16.9 (H) 4.0 - 10.5 K/uL   RBC 2.62 (L) 3.87 - 5.11 MIL/uL   Hemoglobin 8.0 (L) 12.0 - 15.0 g/dL  HCT 26.7 (L) 36.0 - 46.0 %   MCV 101.9 (H) 78.0 - 100.0 fL   MCH 30.5 26.0 - 34.0 pg   MCHC 30.0 30.0 - 36.0 g/dL   RDW 17.0 (H) 11.5 - 15.5 %   Platelets 395 150 - 400 K/uL  Renal function panel     Status: Abnormal   Collection Time: 05/21/16  5:10 AM  Result Value Ref Range   Sodium 142 135 - 145 mmol/L   Potassium 4.6 3.5 - 5.1 mmol/L    Comment: DELTA CHECK NOTED   Chloride 101 101 - 111 mmol/L   CO2 24 22 - 32 mmol/L   Glucose, Bld 111 (H) 65 - 99 mg/dL   BUN 86 (H) 6 - 20 mg/dL   Creatinine, Ser 11.03 (H) 0.44 - 1.00 mg/dL   Calcium 10.0 8.9 - 10.3 mg/dL   Phosphorus 8.4 (H) 2.5 - 4.6 mg/dL   Albumin 2.8 (L) 3.5 - 5.0 g/dL   GFR calc non Af Amer 3 (L) >60 mL/min   GFR calc Af Amer 3 (L) >60 mL/min    Comment: (NOTE) The eGFR has been calculated using the CKD EPI equation. This calculation has not been validated in all clinical  situations. eGFR's persistently <60 mL/min signify possible Chronic Kidney Disease.    Anion gap 17 (H) 5 - 15  Glucose, capillary     Status: None   Collection Time: 05/21/16  7:16 AM  Result Value Ref Range   Glucose-Capillary 94 65 - 99 mg/dL  Glucose, capillary     Status: None   Collection Time: 05/21/16 11:33 AM  Result Value Ref Range   Glucose-Capillary 94 65 - 99 mg/dL  Glucose, capillary     Status: None   Collection Time: 05/21/16  4:15 PM  Result Value Ref Range   Glucose-Capillary 88 65 - 99 mg/dL  Glucose, capillary     Status: None   Collection Time: 05/21/16  7:43 PM  Result Value Ref Range   Glucose-Capillary 92 65 - 99 mg/dL   Comment 1 Notify RN   Glucose, capillary     Status: None   Collection Time: 05/22/16 12:17 AM  Result Value Ref Range   Glucose-Capillary 88 65 - 99 mg/dL   Comment 1 Notify RN   Glucose, capillary     Status: None   Collection Time: 05/22/16  3:54 AM  Result Value Ref Range   Glucose-Capillary 96 65 - 99 mg/dL   Comment 1 Notify RN   Heparin level (unfractionated)     Status: None   Collection Time: 05/22/16  5:20 AM  Result Value Ref Range   Heparin Unfractionated 0.62 0.30 - 0.70 IU/mL    Comment:        IF HEPARIN RESULTS ARE BELOW EXPECTED VALUES, AND PATIENT DOSAGE HAS BEEN CONFIRMED, SUGGEST FOLLOW UP TESTING OF ANTITHROMBIN III LEVELS.   CBC     Status: Abnormal   Collection Time: 05/22/16  5:20 AM  Result Value Ref Range   WBC 17.4 (H) 4.0 - 10.5 K/uL   RBC 2.59 (L) 3.87 - 5.11 MIL/uL   Hemoglobin 7.9 (L) 12.0 - 15.0 g/dL   HCT 26.0 (L) 36.0 - 46.0 %   MCV 100.4 (H) 78.0 - 100.0 fL   MCH 30.5 26.0 - 34.0 pg   MCHC 30.4 30.0 - 36.0 g/dL   RDW 17.1 (H) 11.5 - 15.5 %   Platelets 440 (H) 150 - 400 K/uL  Glucose, capillary  Status: None   Collection Time: 05/22/16  7:18 AM  Result Value Ref Range   Glucose-Capillary 77 65 - 99 mg/dL    ABGS  Recent Labs  05/20/16 0618  PHART 7.418  PO2ART 112*  HCO3  26.5   CULTURES Recent Results (from the past 240 hour(s))  Culture, blood (routine x 2)     Status: None   Collection Time: 05/17/16 10:49 PM  Result Value Ref Range Status   Specimen Description BLOOD PICC  Final   Special Requests BOTTLES DRAWN AEROBIC AND ANAEROBIC 10CC EACH  Final   Culture NO GROWTH 5 DAYS  Final   Report Status 05/22/2016 FINAL  Final  Culture, blood (routine x 2)     Status: None   Collection Time: 05/17/16 10:51 PM  Result Value Ref Range Status   Specimen Description BLOOD PICC  Final   Special Requests BOTTLES DRAWN AEROBIC AND ANAEROBIC 10CC EACH  Final   Culture NO GROWTH 5 DAYS  Final   Report Status 05/22/2016 FINAL  Final   Studies/Results: No results found.  Medications:  Prior to Admission:  Prescriptions Prior to Admission  Medication Sig Dispense Refill Last Dose  . ALPRAZolam (XANAX) 0.25 MG tablet Take 1-2 tablets by mouth at bedtime as needed for sleep.    05/08/2016 at Unknown time  . amiodarone (PACERONE) 200 MG tablet Take 1 tablet (200 mg total) by mouth daily. 30 tablet 3 05/08/2016 at Unknown time  . aspirin EC 81 MG tablet Take 81 mg by mouth daily.    05/08/2016 at Unknown time  . cloNIDine (CATAPRES) 0.1 MG tablet Take 1 tablet (0.1 mg total) by mouth 2 (two) times daily. 60 tablet 11 05/08/2016 at Unknown time  . epoetin alfa (EPOGEN,PROCRIT) 35597 UNIT/ML injection Inject 1 mL (10,000 Units total) into the vein every Monday, Wednesday, and Friday with hemodialysis. 1 mL 10 05/06/2016 at Unknown time  . fluticasone (FLONASE) 50 MCG/ACT nasal spray Place 2 sprays into the nose daily as needed for allergies.    unknown  . labetalol (NORMODYNE) 200 MG tablet Take 1 tablet (200 mg total) by mouth 2 (two) times daily. 90 tablet 1 05/08/2016 at Unknown time  . lanthanum (FOSRENOL) 1000 MG chewable tablet Chew 1 tablet (1,000 mg total) by mouth 3 (three) times daily with meals. 90 tablet 1 05/08/2016 at Unknown time  . multivitamin  (RENA-VIT) TABS tablet Take 1 tablet by mouth daily.   05/08/2016 at Unknown time  . nitroGLYCERIN (NITROSTAT) 0.4 MG SL tablet Place 1 tablet (0.4 mg total) under the tongue every 5 (five) minutes as needed for chest pain. 25 tablet 3 05/08/2016 at Unknown time  . omeprazole (PRILOSEC) 20 MG capsule TAKE ONE CAPSULE BY MOUTH ONCE DAILY 30 capsule 5 05/08/2016 at Unknown time  . PROAIR HFA 108 (90 BASE) MCG/ACT inhaler Inhale 1 puff into the lungs every 6 (six) hours as needed for wheezing or shortness of breath.    unknown  . SENSIPAR 30 MG tablet Take 30 mg by mouth daily with supper.    05/08/2016 at Unknown time  . simvastatin (ZOCOR) 20 MG tablet Take 1 tablet (20 mg total) by mouth at bedtime. 90 tablet 3 05/07/2016 at Unknown time   Scheduled: . amiodarone  200 mg Oral Daily  . aspirin EC  81 mg Oral Daily  . chlorhexidine gluconate (MEDLINE KIT)  15 mL Mouth Rinse BID  . cloNIDine  0.1 mg Oral BID  . epoetin (EPOGEN/PROCRIT) injection  14,000 Units Intravenous Q T,Th,Sa-HD  . famotidine (PEPCID) IV  20 mg Intravenous Q24H  . hydrALAZINE  5 mg Intravenous Once  . mouth rinse  15 mL Mouth Rinse QID  . multivitamin  1 tablet Oral Daily  . piperacillin-tazobactam (ZOSYN)  IV  3.375 g Intravenous Q12H  . polyethylene glycol  17 g Per Tube Daily  . vancomycin  500 mg Intravenous Q T,Th,Sa-HD   Continuous: . heparin 850 Units/hr (05/22/16 0927)   GGY:IRSWNI chloride, sodium chloride, sodium chloride, sodium chloride, sodium chloride, sodium chloride, sodium chloride, sodium chloride, acetaminophen, acetaminophen, ALPRAZolam, alteplase, alteplase, fentaNYL (SUBLIMAZE) injection, fentaNYL (SUBLIMAZE) injection, fluticasone, heparin, heparin, hydrALAZINE, ipratropium-albuterol, lidocaine (PF), lidocaine-prilocaine, midazolam, ondansetron (ZOFRAN) IV, pentafluoroprop-tetrafluoroeth  Assesment: She was admitted with hypertensive crisis. She had CODE BLUE 2 that resulted in acute respiratory  failure with ventilator support. She is now off the ventilator for about 48 hours and doing okay. She has end-stage renal disease on dialysis which continues. Principal Problem:   Hypertensive crisis Active Problems:   Coronary atherosclerosis of native coronary artery   ESRD on hemodialysis (HCC)   Anemia of chronic kidney failure   Chronic diastolic CHF (congestive heart failure) (HCC)   Chest pain   Acute respiratory failure (HCC)   Hypertensive crisis without congestive heart failure   Cardiac arrest [facility])   Palliative care encounter   DNR (do not resuscitate) discussion   Goals of care, counseling/discussion    Plan: Since she does not have any lung disease at baseline and she's done well now for 48 hours off the ventilator I'm going to plan to sign off. Thanks for allowing me to see her with you and I will be happy to get involved again if need be.    LOS: 13 days   Jayleene Glaeser L 05/22/2016, 10:15 AM

## 2016-05-22 NOTE — Progress Notes (Signed)
ANTICOAGULATION CONSULT NOTE - follow up  Pharmacy Consult for Heparin Indication: r/o  DVT  Patient Measurements: Height: _0  (154.9 cm) Weight: 121 lb 7.6 oz (55.1 kg) IBW/kg (Calculated) : 47.8  HEPARIN DW (KG): 53.7   Vital Signs: Temp: 97.8 F (36.6 C) (11/12 0718) Temp Source: Axillary (11/12 0400) BP: 105/86 (11/12 0600) Pulse Rate: 98 (11/12 0600)  Labs:  Recent Labs  05/19/16 1113  05/20/16 0420 05/20/16 1625 05/21/16 0510 05/22/16 0520  HGB  --   < > 8.4*  --  8.0* 7.9*  HCT  --   --  27.6*  --  26.7* 26.0*  PLT  --   --  368  --  395 440*  HEPARINUNFRC  --   < > 0.24* 0.35 0.49 0.62  CREATININE  --   --  8.74*  --  11.03*  --   CKTOTAL 33*  --   --   --   --   --   CKMB 1.5  --   --   --   --   --   < > = values in this interval not displayed. Estimated Creatinine Clearance: 3.3 mL/min (by C-G formula based on SCr of 11.03 mg/dL (H)).  Medical History: Past Medical History:  Diagnosis Date  . Anxiety   . Arthritis   . AVM (arteriovenous malformation) of colon   . CAD (coronary artery disease)    a. 12/2011 NSTEMI/Cath/PCI LCX (2.25x14 Resolute DES) & D1 (2.25x22 Resolute DES);  b. 01/2012 Cath/PCI: LM 30, LAD 30p, 40-4m D1 stent ok, 99 in sm branch of diag, LCX patent stent, OM1 20, RCA 95 ost (4.0x12 Promus DES), EF 55%;  c. 04/2012 Lexi Cardiolite  EF 48%, small area of scar @ base/mid inflat wall with mild peri-infarct ischemia.; CABG 12/4  . Carotid artery disease ([place]    a. 612-87%LICA, 98/6767  . Chronic bronchitis ([facility]   . Chronic diastolic CHF (congestive heart failure) ([place]    a. 02/2012 Echo EF 60-65%, nl wall motion, Gr 1 DD, mod MR  . Colon [place]  . Esophageal stricture   . ESRD on hemodialysis ([place]    ESRD due to HTN, started dialysis 2011 [facility].  Access is LUA AVF as of Sept 2014.   .Marland KitchenGERD (gastroesophageal reflux disease)   . High cholesterol 12/2011  . History of  blood transfusion 07/2011; 12/2011; 01/2012 X 2; 04/2012  . History of gout   . History of lower GI bleeding   . Hypertension   . Iron deficiency anemia   . Mitral regurgitation    a. Moderate by echo, 02/2012  . Myocardial infarction   . Ovarian cancer ([place] 1992  . Pneumonia ~ 2009  . PUD (peptic ulcer disease)   . TIA (transient ischemic attack)    Medications:  Scheduled:  . amiodarone  200 mg Oral Daily  . aspirin EC  81 mg Oral Daily  . chlorhexidine gluconate (MEDLINE KIT)  15 mL Mouth Rinse BID  . cloNIDine  0.1 mg Oral BID  . epoetin (EPOGEN/PROCRIT) injection  14,000 Units Intravenous Q T,Th,Sa-HD  . famotidine (PEPCID) IV  20 mg Intravenous Q24H  . hydrALAZINE  5 mg Intravenous Once  . mouth rinse  15 mL Mouth Rinse QID  . multivitamin  1 tablet Oral Daily  . piperacillin-tazobactam (ZOSYN)  IV  3.375 g Intravenous Q12H  . polyethylene glycol  17 g Per Tube Daily  . vancomycin  500 mg Intravenous Q T,Th,Sa-HD   Assessment: Venous dopplers reporting stable appearance of thrombus in the right basilic vein. IV heparin until DVT ruled out.  Heparin level is therapeutic this am but has trended up the last few days  Hg trending down but no bleeding reported  Goal of Therapy:  Heparin level 0.3-0.7 units/ml Monitor platelets by anticoagulation protocol: Yes   Plan:  Decrease heparin slightly to 850 units/hr Heparin level and CBC daily Monitor for s/s of bleeding   Thanks for allowing pharmacy to be a part of this patient's care.  Excell Seltzer, PharmD Clinical Pharmacist  05/22/2016,8:42 AM

## 2016-05-23 LAB — CBC
HCT: 25.2 % — ABNORMAL LOW (ref 36.0–46.0)
Hemoglobin: 7.8 g/dL — ABNORMAL LOW (ref 12.0–15.0)
MCH: 30.8 pg (ref 26.0–34.0)
MCHC: 31 g/dL (ref 30.0–36.0)
MCV: 99.6 fL (ref 78.0–100.0)
PLATELETS: 458 10*3/uL — AB (ref 150–400)
RBC: 2.53 MIL/uL — AB (ref 3.87–5.11)
RDW: 17.4 % — AB (ref 11.5–15.5)
WBC: 15.5 10*3/uL — AB (ref 4.0–10.5)

## 2016-05-23 LAB — GLUCOSE, CAPILLARY
GLUCOSE-CAPILLARY: 135 mg/dL — AB (ref 65–99)
GLUCOSE-CAPILLARY: 88 mg/dL (ref 65–99)
GLUCOSE-CAPILLARY: 96 mg/dL (ref 65–99)
Glucose-Capillary: 91 mg/dL (ref 65–99)
Glucose-Capillary: 104 mg/dL — ABNORMAL HIGH (ref 65–99)
Glucose-Capillary: 96 mg/dL (ref 65–99)

## 2016-05-23 LAB — RENAL FUNCTION PANEL
ANION GAP: 16 — AB (ref 5–15)
Albumin: 2.8 g/dL — ABNORMAL LOW (ref 3.5–5.0)
BUN: 69 mg/dL — AB (ref 6–20)
CHLORIDE: 99 mmol/L — AB (ref 101–111)
CO2: 26 mmol/L (ref 22–32)
Calcium: 9.9 mg/dL (ref 8.9–10.3)
Creatinine, Ser: 9.19 mg/dL — ABNORMAL HIGH (ref 0.44–1.00)
GFR calc Af Amer: 4 mL/min — ABNORMAL LOW (ref >60)
GFR calc non Af Amer: 4 mL/min — ABNORMAL LOW (ref >60)
GLUCOSE: 95 mg/dL (ref 65–99)
PHOSPHORUS: 8.2 mg/dL — AB (ref 2.5–4.6)
POTASSIUM: 3.6 mmol/L (ref 3.5–5.1)
Sodium: 141 mmol/L (ref 135–145)

## 2016-05-23 LAB — HEPARIN LEVEL (UNFRACTIONATED): HEPARIN UNFRACTIONATED: 0.45 [IU]/mL (ref 0.30–0.70)

## 2016-05-23 NOTE — Progress Notes (Signed)
PROGRESS NOTE    Shawna [name]  TKP:[id number] DOB: August 12, 1940 DOA: 05/08/2016 PCP: [name], [place]    Brief Narrative:  83 yof with a history of CAD and ESRD, presented with acute onset of chest pain unrelieved by aspirin and nitroglycerin. In the emergency department BP 230/130 and she was noted to have new ST depression. She was started on nitroglycerin infusion and IV labetalol and admitted for hypertensive crisis with chest pain.Her chest pain had resolved but she remained hypertensive. On 11/1 patient suddenly became bradycardic and unresponsive. She underwent CPR, chest compressions, received epinephrine and had ROSC. On 11/2 prior to starting her dialysis she was noted to be hypertensive so she was given IV labetalol and developed bradycardia with subsequent PEA arrest. She underwent CPR again for < 5 mins and had ROSC. Weaning has been difficult due to periods of apnea. Pulmonology and Neurology assisting. Patient is currently extubated and has not required bipap during the day. Pulmonology has signed off  Assessment & Plan:   Principal Problem:   Hypertensive crisis Active Problems:   Coronary atherosclerosis of native coronary artery   ESRD on hemodialysis (HCC)   Anemia of chronic kidney failure   Chronic diastolic CHF (congestive heart failure) (HCC)   Chest pain   Acute respiratory failure ([place])   Hypertensive crisis without congestive heart failure   Cardiac arrest [facility])   Palliative care encounter   DNR (do not resuscitate) discussion   Goals of care, counseling/discussion  1. Bradycardia with PEA cardiac arrest. On 11/1, patient's heart rate became bradycardic into PEA. She underwent CPR, received epinephrine, bicarbonate and calcium chloride. She had ROSC after approximately 25 mins of CPR. Lab work had indicated an elevated potassium of 6.4, which may have been cause. She was brieflystarted on a epinephrine infusion which was quicklyweaned off. CXR did not show any  acute process. Cardiology following. Resolved 2. Acute respiratory failure: Ventilator dependent. Patient intubated while undergoing CPR. Pulmonology following. She is undergoing daily weaning trials. She remains hemodynamically stable. Weaning has been unsuccessful due to intermittent periods of apnea. Pt currently extubated. Pulmonology has signed off will transition to floor.  3. Hypertensive crisis with chest pain. Chest pain resolved. Cards has been on board. Blood pressures have been labile. Patient has periods where her blood pressures are high and then periods where they are soft. I do not want to be overly aggressive with her blood pressures as she has seen drops in her blood pressures as well. As such will continue prn hydralazine. 4. Elevated troponin. Patient has a history of CAD status post CABG and stenting. Patient had no evidence of ACS on admission. Since that time, her troponin has started to rise. Cardiology following and felt that this may be demand ischemia/related to CPR. Troponin peak was 1.5 and has since trended down since.  5. ESRD on HD MWF. Nephrology is following for dialysis needs.  6. Chronic diastolic congestive heart failure. Patient has an echocardiogram on 09/2015 with an EF of 51-70%, grade 2 diastolic dysfunction. Continue fluid management with dialysis. 7. PSVT, maintained on amiodarone. EKG remains in sinus rhythm.  8. Myoclonus. Suspect secondary to CKD/ESRD on HD. No history or objective findings to suggest acute CNS process. Reviewed meds, no likely offenders. Neurology on board. 9. Hyperkalemia. Resolved.  10. Elevated TSH. Recheck as an outpatient. 11. Anemia of chronic kidney disease. Hemoglobin currently stable. Will continue to monitor. 12. Right hand swelling. Started on IV antibiotics due to concern of cellulitis. Xray  of hand does not show any gas. Venous dopplers reporting stable appearance of thrombus in the right basilic vein. on IV heparin  DVT  prophylaxis: Heparin infusion Code Status: FULL  Family Communication: husband at bedside Disposition Plan: tx to floor   Consultants:   Nephrology   Cardiology  Pulmonology  Nutrition   Neurology  Procedures:   Intubation 11/1>>  Antimicrobials  Vancomycin 11/7>>  Zosyn 11/7>>   Subjective: Pt shakes her head yes when asked if she feels better  Objective: Vitals:   05/23/16 0800 05/23/16 0900 05/23/16 1000 05/23/16 1100  BP: (!) 147/69 (!) 131/103 125/83 (!) 125/113  Pulse: 94 92 92 92  Resp: 14 17 (!) 25 20  Temp: 98.3 F (36.8 C)     TempSrc:      SpO2: 100% 100% 100% 100%  Weight:      Height:        Intake/Output Summary (Last 24 hours) at 05/23/16 1140 Last data filed at 05/23/16 1100  Gross per 24 hour  Intake           298.23 ml  Output              800 ml  Net          -501.77 ml   Filed Weights   05/21/16 0400 05/21/16 1940 05/23/16 0500  Weight: 54.6 kg (120 lb 5.9 oz) 55.1 kg (121 lb 7.6 oz) 54.4 kg (119 lb 14.9 oz)    Examination:  General exam: Appears calm and comfortable, in nad. Respiratory system: Clear to auscultation. Respiratory effort normal. Cardiovascular system: S1 & S2 heard, RRR. No JVD, murmurs, rubs, gallops or clicks. No pedal edema. Gastrointestinal system: Abdomen is nondistended, soft and nontender. No organomegaly or masses felt. Normal bowel sounds heard. Central nervous system: limited non verbal responses to examiner. No facial asymmetry Extremities: right hand is edematous. Pulses are palpable. Erythema and warmth noted over hand. Skin: No rashes, lesions or ulcers Psychiatry: cannot assess    Data Reviewed: I have personally reviewed following labs and imaging studies  CBC:  Recent Labs Lab 05/19/16 0423 05/20/16 0420 05/21/16 0510 05/22/16 0520 05/23/16 0445  WBC 19.0* 18.2* 16.9* 17.4* 15.5*  HGB 9.5* 8.4* 8.0* 7.9* 7.8*  HCT 29.9* 27.6* 26.7* 26.0* 25.2*  MCV 98.7 100.4* 101.9* 100.4* 99.6    PLT 404* 368 395 440* 299*   Basic Metabolic Panel:  Recent Labs Lab 05/18/16 1835 05/19/16 0423 05/20/16 0420 05/21/16 0510 05/23/16 0908  NA 138 137  138 141 142 141  K 3.9 4.1  4.1 3.5 4.6 3.6  CL 96* 95*  96* 100* 101 99*  CO2 _0 GLUCOSE 146* 181*  181* 164* 111* 95  BUN 60* 70*  71* 64* 86* 69*  CREATININE 9.13* 9.94*  10.20* 8.74* 11.03* 9.19*  CALCIUM 9.2 8.9  9.0 9.4 10.0 9.9  PHOS 8.2* 8.0* 5.1* 8.4* 8.2*   GFR: Estimated Creatinine Clearance: 3.9 mL/min (by C-G formula based on SCr of 9.19 mg/dL (H)). Liver Function Tests:  Recent Labs Lab 05/18/16 1835 05/19/16 0423 05/20/16 0420 05/21/16 0510 05/23/16 0908  AST  --  36  --   --   --   ALT  --  178*  --   --   --   ALKPHOS  --  75  --   --   --   BILITOT  --  0.9  --   --   --  PROT  --  6.4*  --   --   --   ALBUMIN 3.0* 2.8*  2.8* 2.9* 2.8* 2.8*   No results for input(s): LIPASE, AMYLASE in the last 168 hours. No results for input(s): AMMONIA in the last 168 hours. Coagulation Profile: No results for input(s): INR, PROTIME in the last 168 hours. Cardiac Enzymes:  Recent Labs Lab 05/19/16 1113  CKTOTAL 33*  CKMB 1.5   BNP (last 3 results) No results for input(s): PROBNP in the last 8760 hours. HbA1C: No results for input(s): HGBA1C in the last 72 hours. CBG:  Recent Labs Lab 05/22/16 1934 05/23/16 0009 05/23/16 0402 05/23/16 0720 05/23/16 1118  GLUCAP 91 88 96 91 104*   Lipid Profile: No results for input(s): CHOL, HDL, LDLCALC, TRIG, CHOLHDL, LDLDIRECT in the last 72 hours. Thyroid Function Tests: No results for input(s): TSH, T4TOTAL, FREET4, T3FREE, THYROIDAB in the last 72 hours. Anemia Panel: No results for input(s): VITAMINB12, FOLATE, FERRITIN, TIBC, IRON, RETICCTPCT in the last 72 hours. Sepsis Labs:  Recent Labs Lab 05/17/16 2251 05/18/16 0139  LATICACIDVEN 1.2 1.1    Recent Results (from the past 240 hour(s))  Culture, blood (routine x  2)     Status: None   Collection Time: 05/17/16 10:49 PM  Result Value Ref Range Status   Specimen Description BLOOD PICC  Final   Special Requests BOTTLES DRAWN AEROBIC AND ANAEROBIC 10CC EACH  Final   Culture NO GROWTH 5 DAYS  Final   Report Status 05/22/2016 FINAL  Final  Culture, blood (routine x 2)     Status: None   Collection Time: 05/17/16 10:51 PM  Result Value Ref Range Status   Specimen Description BLOOD PICC  Final   Special Requests BOTTLES DRAWN AEROBIC AND ANAEROBIC 10CC EACH  Final   Culture NO GROWTH 5 DAYS  Final   Report Status 05/22/2016 FINAL  Final     Radiology Studies: No results found.  Scheduled Meds: . amiodarone  200 mg Oral Daily  . aspirin EC  81 mg Oral Daily  . chlorhexidine gluconate (MEDLINE KIT)  15 mL Mouth Rinse BID  . cloNIDine  0.1 mg Oral BID  . epoetin (EPOGEN/PROCRIT) injection  14,000 Units Intravenous Q T,Th,Sa-HD  . famotidine (PEPCID) IV  20 mg Intravenous Q24H  . hydrALAZINE  5 mg Intravenous Once  . mouth rinse  15 mL Mouth Rinse QID  . multivitamin  1 tablet Oral Daily  . piperacillin-tazobactam (ZOSYN)  IV  3.375 g Intravenous Q12H  . polyethylene glycol  17 g Per Tube Daily  . vancomycin  500 mg Intravenous Q T,Th,Sa-HD   Continuous Infusions: . heparin 850 Units/hr (05/23/16 0232)     LOS: 14 days   Time spent: 25 minutes   Jaice Digioia [facility] If 7PM-7AM, please contact night-coverage [email or web address] Password [email or web address] 05/23/2016, 11:40 AM

## 2016-05-23 NOTE — Evaluation (Addendum)
Physical Therapy Evaluation Patient Details Name: Shawna [name] MRN: [id number] DOB: [birth year] Today's Date: 05/23/2016   History of Present Illness  82 yof with a history of CAD and ESRD, presented with acute onset of chest pain unrelieved by aspirin and nitroglycerin. In the emergency department BP 230/130 and she was noted to have new ST depression. She was started on nitroglycerin infusion and IV labetalol and admitted for hypertensive crisis with chest pain. Her chest pain had resolved but she remained hypertensive. On 11/1 patient suddenly became bradycardic and unresponsive. She underwent CPR, chest compressions, received epinephrine and had ROSC. On 11/2 prior to starting her dialysis she was noted to be hypertensive so she was given IV labetalol and developed bradycardia with subsequent PEA arrest. She underwent CPR again for < 5 mins and had ROSC. Pt was intubated 05/11/2016 -weaning has been difficult due to periods of apnea, but was successfully extubated on 05/20/2016.  Pulmonology and Neurology assisting. Patient is currently extubated and has not required bipap during the day. Pulmonology has signed off  Clinical Impression  Pt received in bed, and is agreeable to PT evaluation.  Pt's dtr is present, and is a PT for home health in [place].  Pt has difficulty answering PLOF questions, and therefore dtr assists with this.  She states that the pt was independent with all functional mobility prior to admission, and was independent with ADL's.  During PT evaluation, she required max A for supine<>sit.  Once sitting on the EOB, she required up to Mod A to maintain static sitting balance.  She also seemed to demonstrate some inattention on the right side when requested to do things on the right.  At this point, she is recommended for SNF to continue to progress strength, balance, and functional mobility.  Also recommend OT evaluation to progress towards independence with ADL's.     Follow Up  Recommendations SNF    Equipment Recommendations  None recommended by PT    Recommendations for Other Services OT consult     Precautions / Restrictions Precautions Precautions: Fall Precaution Comments: Pt's dtr reports that she has had 2 falls in the past 6 months - 1 getting up off the toilet, and the other when she was leaning over to look at her husband and see who he was talking to outside.       Mobility  Bed Mobility Overal bed mobility: Needs Assistance Bed Mobility: Supine to Sit     Supine to sit: Max assist;HOB elevated (Due to poor initiation of the movement, and poor motor planning. )     General bed mobility comments: Pt was able to sit on the EOB for ~8 min with assistance  Transfers Overall transfer level:  (NT due to poor static sitting posture. )                  Ambulation/Gait                Stairs            Wheelchair Mobility    Modified Rankin (Stroke Patients Only)       Balance Overall balance assessment: Needs assistance Sitting-balance support: Bilateral upper extremity supported;Feet supported Sitting balance-Leahy Scale: Poor Sitting balance - Comments: Pt looses her balance in all directions, and requires at least Mod A to maintain balance.  Postural control: Posterior lean  Pertinent Vitals/Pain Pain Assessment: No/denies pain    Home Living   Living Arrangements: Spouse/significant other Available Help at Discharge: Available 24 hours/day Type of Home: House Home Access: Stairs to enter   [employer] of Steps: 3 steps Home Layout: One level Home Equipment: Cane - single point Additional Comments: Dtr answering questions regarding DME - does not believe she still has the RW.     Prior Function Level of Independence: Independent               Hand Dominance   Dominant Hand: Right    Extremity/Trunk Assessment   Upper Extremity  Assessment: Generalized weakness (Pt demonstrates gross weakness in B UE's.  She now demonstrates at least a 2/5 for B UE elevation, very poor grip, )           Lower Extremity Assessment: RLE deficits/detail;LLE deficits/detail RLE Deficits / Details: Pt demonsrates at least 2/5 strength with tc's for initiation, but is not able to initate movement against gravity - possibly due to inattention.   LLE Deficits / Details: Grossly 3/5  Cervical / Trunk Assessment: Kyphotic  Communication   Communication: Other (comment) (Due to confusion)  Cognition Arousal/Alertness: Awake/alert Behavior During Therapy: WFL for tasks assessed/performed Overall Cognitive Status: Impaired/Different from baseline Area of Impairment: Orientation;Following commands;Safety/judgement Orientation Level: Disoriented to;Place;Situation;Time     Following Commands: Follows one step commands inconsistently Safety/Judgement: Decreased awareness of safety;Decreased awareness of deficits          General Comments      Exercises     Assessment/Plan    PT Assessment Patient needs continued PT services  PT Problem List Decreased strength;Decreased activity tolerance;Decreased balance;Decreased mobility;Decreased coordination;Decreased cognition;Decreased knowledge of use of DME;Decreased safety awareness;Decreased knowledge of precautions;Cardiopulmonary status limiting activity          PT Treatment Interventions DME instruction;Gait training;Functional mobility training;Therapeutic activities;Therapeutic exercise;Balance training;Neuromuscular re-education;Cognitive remediation;Patient/family education    PT Goals (Current goals can be found in the Care Plan section)  Acute Rehab PT Goals Patient Stated Goal: pt would like to get stronger so she can go home.  PT Goal Formulation: With patient/family Time For Goal Achievement: 06/06/16 Potential to Achieve Goals: Fair    Frequency Min 5X/week    Barriers to discharge        Co-evaluation               End of Session Equipment Utilized During Treatment: Oxygen Activity Tolerance: Patient limited by fatigue Patient left: in bed;with call bell/phone within reach;with family/visitor present Nurse Communication: Mobility status;Need for lift equipment Purcell Nails, RN notified of pt's mobility status, and she will need to use maxi move for any OOB mobility at this time. )    Functional Assessment Tool Used: [employer] "6-clicks"  Functional Limitation: Mobility: Walking and moving around Mobility: Walking and Moving Around Current Status [phone]): At least 80 percent but less than 100 percent impaired, limited or restricted Mobility: Walking and Moving Around Goal Status [phone]): At least 60 percent but less than 80 percent impaired, limited or restricted    Time: 1430-1455 PT Time Calculation (min) (ACUTE ONLY): 25 min   Charges:   PT Evaluation $PT Eval Moderate Complexity: 1 Procedure PT Treatments $Therapeutic Activity: 8-22 mins   PT G Codes:   PT G-Codes **NOT FOR INPATIENT CLASS** Functional Assessment Tool Used: [employer] "6-clicks"  Functional Limitation: Mobility: Walking and moving around Mobility: Walking and Moving Around Current Status [phone]): At least 80 percent but  less than 100 percent impaired, limited or restricted Mobility: Walking and Moving Around Goal Status [phone]): At least 60 percent but less than 80 percent impaired, limited or restricted   Beth Gaylia Kassel, PT, DPT X: [phone]

## 2016-05-23 NOTE — Progress Notes (Signed)
ANTICOAGULATION CONSULT NOTE - follow up  Pharmacy Consult for Heparin Indication: r/o  DVT  Patient Measurements: Height: 5' 1"  (154.9 cm) Weight: 119 lb 14.9 oz (54.4 kg) IBW/kg (Calculated) : 47.8  HEPARIN DW (KG): 53.7   Vital Signs: Temp: 98.4 F (36.9 C) (11/13 1200) Temp Source: Axillary (11/13 0400) BP: 125/113 (11/13 1100) Pulse Rate: 92 (11/13 1100)  Labs:  Recent Labs  05/21/16 0510 05/22/16 0520 05/23/16 0445 05/23/16 0908  HGB 8.0* 7.9* 7.8*  --   HCT 26.7* 26.0* 25.2*  --   PLT 395 440* 458*  --   HEPARINUNFRC 0.49 0.62 0.45  --   CREATININE 11.03*  --   --  9.19*   Estimated Creatinine Clearance: 3.9 mL/min (by C-G formula based on SCr of 9.19 mg/dL (H)).  Medical History: Past Medical History:  Diagnosis Date  . Anxiety   . Arthritis   . AVM (arteriovenous malformation) of colon   . CAD (coronary artery disease)    a. 12/2011 NSTEMI/Cath/PCI LCX (2.25x14 Resolute DES) & D1 (2.25x22 Resolute DES);  b. 01/2012 Cath/PCI: LM 30, LAD 30p, 40-65m D1 stent ok, 99 in sm branch of diag, LCX patent stent, OM1 20, RCA 95 ost (4.0x12 Promus DES), EF 55%;  c. 04/2012 Lexi Cardiolite  EF 48%, small area of scar @ base/mid inflat wall with mild peri-infarct ischemia.; CABG 12/4  . Carotid artery disease ([place]    a. 675-91%LICA, 96/3846  . Chronic bronchitis ([place]   . Chronic diastolic CHF (congestive heart failure) ([place]    a. 02/2012 Echo EF 60-65%, nl wall motion, Gr 1 DD, mod MR  . Colon cancer ([place] 1992  . Esophageal stricture   . ESRD [place] hemodialysis ([place]    ESRD due to HTN, started dialysis 2011 [facility]  Access is LUA AVF as of Sept 2014.   .Marland KitchenGERD (gastroesophageal reflux disease)   . High cholesterol 12/2011  . History of blood transfusion 07/2011; 12/2011; 01/2012 X 2; 04/2012  . History of gout   . History of lower GI bleeding   . Hypertension   . Iron deficiency anemia   . Mitral regurgitation    a.  Moderate by echo, 02/2012  . Myocardial infarction   . [place]  . Pneumonia ~ 2009  . PUD (peptic ulcer disease)   . TIA (transient ischemic attack)    Medications:  Scheduled:  . amiodarone  200 mg Oral Daily  . aspirin EC  81 mg Oral Daily  . chlorhexidine gluconate (MEDLINE KIT)  15 mL Mouth Rinse BID  . cloNIDine  0.1 mg Oral BID  . epoetin (EPOGEN/PROCRIT) injection  14,000 Units Intravenous Q T,Th,Sa-HD  . famotidine (PEPCID) IV  20 mg Intravenous Q24H  . hydrALAZINE  5 mg Intravenous Once  . mouth rinse  15 mL Mouth Rinse QID  . multivitamin  1 tablet Oral Daily  . piperacillin-tazobactam (ZOSYN)  IV  3.375 g Intravenous Q12H  . polyethylene glycol  17 g Per Tube Daily  . vancomycin  500 mg Intravenous Q T,Th,Sa-HD   Assessment: Venous dopplers reporting stable appearance of thrombus in the right basilic vein. IV heparin until DVT ruled out.  Heparin level is therapeutic this am   Hg trending down but no bleeding reported  Goal of Therapy:  Heparin level 0.3-0.7 units/ml Monitor platelets by anticoagulation protocol: Yes   Plan:  Cont heparin drip at  850 units/hr Heparin level and CBC daily Monitor for s/s of bleeding F/u plan need for oral AC   Thanks for allowing pharmacy to be a part of this patient's care.  Excell Seltzer, PharmD Clinical Pharmacist  05/23/2016,1:36 PM

## 2016-05-23 NOTE — [place] FL2 (Deleted)
[place] MEDICAID FL2 LEVEL OF CARE SCREENING TOOL     IDENTIFICATION  Patient Name: Shawna [name] Birthdate: 1941-07-11 Sex: female Admission Date (Current Location): 05/08/2016  [facility] and [place] Number:  [employer] and Address:  [place] [address] [place]      Provider Number: [id number]  Attending Physician Name and Address:  Velvet Bathe, MD  Relative Name and Phone Number:       Current Level of Care: Hospital Recommended Level of Care: [place] Prior Approval Number:    Date Approved/Denied:   PASRR Number:    Discharge Plan: SNF    Current Diagnoses: Patient Active Problem List   Diagnosis Date Noted  . Respiratory failure ([place])   . Cardiac arrest ([place])   . Palliative care encounter   . DNR (do not resuscitate) discussion   . Goals of care, counseling/discussion   . Hypertensive crisis without congestive heart failure 05/09/2016  . Acute pulmonary edema ([place]) 04/06/2016  . Acute respiratory failure ([place]) 04/06/2016  . Hypertensive crisis 01/27/2016  . History of colon cancer 01/27/2016  . History of ovarian cancer 01/27/2016  . Hypertensive urgency 01/27/2016  . Narrow complex tachycardia ([place]) 09/08/2015  . SVT (supraventricular tachycardia) ([place]) 09/08/2015  . Influenza A 08/30/2015  . Acute on chronic diastolic CHF (congestive heart failure) ([place]) 05/04/2015  . Unstable angina ([place]) 05/03/2015  . Essential hypertension   . Pain in joint, lower leg 08/14/2014  . Chest pain 11/26/2013  . Small bowel obstruction, partial 05/29/2013  . Chronic diastolic CHF (congestive heart failure) ([place]) 03/22/2013  . GI bleeding 03/21/2013  . Acute blood loss anemia 03/21/2013  . Vaginal odor 03/12/2013  . Vaginal discharge 03/12/2013  . Occlusion and stenosis of carotid artery without mention of cerebral infarction 01/24/2013  . Hx of CABG 07/05/2012  . Carotid artery disease ([place]) 07/05/2012  .  Anemia of chronic kidney failure 07/03/2012  . Secondary hyperparathyroidism ([place]) 07/03/2012  . Mitral regurgitation 06/12/2012  . Pneumonia 06/09/2012  . Non-STEMI (non-ST elevated myocardial infarction) ([place]) 06/08/2012  . GERD (gastroesophageal reflux disease) 01/09/2012  . HLD (hyperlipidemia) 01/05/2012  . Coronary atherosclerosis of native coronary artery 12/16/2011  . ESRD on hemodialysis ([place]) 12/16/2011    Orientation RESPIRATION BLADDER Height & Weight     Time, Situation, Self, Place  O2 (4L) Continent Weight: 119 lb 14.9 oz (54.4 kg) Height:  5' 1"  (154.9 cm)  BEHAVIORAL SYMPTOMS/MOOD NEUROLOGICAL BOWEL NUTRITION STATUS      Continent Diet (see discharge summary/patient is currently NPO)  AMBULATORY STATUS COMMUNICATION OF NEEDS Skin   Limited Assist Verbally Normal                       Personal Care Assistance Level of Assistance  Bathing, Dressing, Feeding Bathing Assistance: Limited assistance Feeding assistance: Independent Dressing Assistance: Limited assistance     Functional Limitations Info  Sight, Hearing, Speech Sight Info: Adequate Hearing Info: Adequate Speech Info: Adequate    SPECIAL CARE FACTORS FREQUENCY                       Contractures      Additional Factors Info  Code Status, Allergies, Psychotropic Code Status Info: Full code Allergies Info: Aspirin, Contrast Media, Iron, Penicillins, Plavix, Amlodipine, Bactrim, Dexilant, Morphine and related, Nitrofurantoin, Prilosec, Venofer, Levaquin, Protonix,  Psychotropic Info: Xanax         Current Medications (05/23/2016):  This is the current hospital active medication list Current Facility-Administered Medications  Medication Dose Route Frequency Provider Last Rate Last Dose  . 0.9 %  sodium chloride infusion  100 mL Intravenous PRN Fran Lowes, MD      . 0.9 %  sodium chloride infusion  100 mL Intravenous PRN Fran Lowes, MD      . 0.9 %  sodium chloride  infusion  100 mL Intravenous PRN Fran Lowes, MD      . 0.9 %  sodium chloride infusion  100 mL Intravenous PRN Fran Lowes, MD      . 0.9 %  sodium chloride infusion  100 mL Intravenous PRN Fran Lowes, MD      . 0.9 %  sodium chloride infusion  100 mL Intravenous PRN Fran Lowes, MD      . 0.9 %  sodium chloride infusion  100 mL Intravenous PRN Fran Lowes, MD      . 0.9 %  sodium chloride infusion  100 mL Intravenous PRN Fran Lowes, MD      . acetaminophen (TYLENOL) suppository 650 mg  650 mg Rectal Q4H PRN [name], NP      . acetaminophen (TYLENOL) tablet 650 mg  650 mg Oral Q4H PRN Vianne Bulls, MD      . ALPRAZolam Duanne Moron) tablet 0.25-0.5 mg  0.25-0.5 mg Oral QHS PRN Vianne Bulls, MD   0.5 mg at 05/10/16 2009  . alteplase (CATHFLO ACTIVASE) injection 2 mg  2 mg Intracatheter Once PRN Fran Lowes, MD      . alteplase (CATHFLO ACTIVASE) injection 2 mg  2 mg Intracatheter Once PRN Fran Lowes, MD      . amiodarone (PACERONE) tablet 200 mg  200 mg Oral Daily Vianne Bulls, MD   200 mg at 05/19/16 0911  . aspirin EC tablet 81 mg  81 mg Oral Daily Vianne Bulls, MD   81 mg at 05/16/16 1000  . chlorhexidine gluconate (MEDLINE KIT) (PERIDEX) 0.12 % solution 15 mL  15 mL Mouth Rinse BID Sinda Du, MD   15 mL at 05/22/16 1944  . cloNIDine (CATAPRES) tablet 0.1 mg  0.1 mg Oral BID Ritta Slot, NP      . epoetin alfa (EPOGEN,PROCRIT) injection 14,000 Units  14,000 Units Intravenous Q T,Th,Sa-HD Fran Lowes, MD   14,000 Units at 05/21/16 2000  . famotidine (PEPCID) IVPB 20 mg premix  20 mg Intravenous Q24H Kathie Dike, MD   20 mg at 05/23/16 1020  . fentaNYL (SUBLIMAZE) injection 50 mcg  50 mcg Intravenous Q15 min PRN Sinda Du, MD   50 mcg at 05/17/16 1529  . fentaNYL (SUBLIMAZE) injection 50 mcg  50 mcg Intravenous Q2H PRN Sinda Du, MD   50 mcg at 05/22/16 2158  . fluticasone (FLONASE) 50 MCG/ACT nasal spray 2  spray  2 spray Each Nare Daily PRN Ilene Qua Opyd, MD      . heparin ADULT infusion 100 units/mL (25000 units/26m sodium chloride 0.45%)  850 Units/hr Intravenous Continuous OVelvet Bathe MD 8.5 mL/hr at 05/23/16 0232 850 Units/hr at 05/23/16 0232  . heparin injection 1,000 Units  1,000 Units Dialysis PRN BFran Lowes MD      . heparin injection 1,000 Units  1,000 Units Dialysis PRN BFran Lowes MD      . hydrALAZINE (APRESOLINE) injection 5 mg  5 mg Intravenous Once MRitta Slot NP      . hydrALAZINE (APRESOLINE) injection 5 mg  5 mg  Intravenous Q6H PRN Velvet Bathe, MD   5 mg at 05/22/16 2007  . ipratropium-albuterol (DUONEB) 0.5-2.5 (3) MG/3ML nebulizer solution 3 mL  3 mL Nebulization Q4H PRN Vianne Bulls, MD   3 mL at 05/11/16 0508  . lidocaine (PF) (XYLOCAINE) 1 % injection 5 mL  5 mL Intradermal PRN Fran Lowes, MD      . lidocaine-prilocaine (EMLA) cream 1 application  1 application Topical PRN Fran Lowes, MD      . MEDLINE mouth rinse  15 mL Mouth Rinse QID Sinda Du, MD   15 mL at 05/23/16 0233  . midazolam (VERSED) injection 1 mg  1 mg Intravenous Q2H PRN Sinda Du, MD   1 mg at 05/19/16 2222  . multivitamin (RENA-VIT) tablet 1 tablet  1 tablet Oral Daily Vianne Bulls, MD   1 tablet at 05/19/16 0911  . ondansetron (ZOFRAN) injection 4 mg  4 mg Intravenous Q6H PRN Vianne Bulls, MD      . pentafluoroprop-tetrafluoroeth (GEBAUERS) aerosol 1 application  1 application Topical PRN Fran Lowes, MD      . piperacillin-tazobactam (ZOSYN) IVPB 3.375 g  3.375 g Intravenous Q12H Vianne Bulls, MD   3.375 g at 05/23/16 1020  . polyethylene glycol (MIRALAX / GLYCOLAX) packet 17 g  17 g Per Tube Daily Kathie Dike, MD   17 g at 05/18/16 1126  . vancomycin (VANCOCIN) 500 mg in sodium chloride 0.9 % 100 mL IVPB  500 mg Intravenous Q T,Th,Sa-HD Vianne Bulls, MD   500 mg at 05/21/16 1136     Discharge Medications: Please see discharge summary for a  list of discharge medications.  Relevant Imaging Results:  Relevant Lab Results:   Additional Information SSN [id number], Clydene Pugh, LCSW

## 2016-05-23 NOTE — Progress Notes (Signed)
Daily Progress Note   Patient Name: Shawna [name]       Date: 05/23/2016 DOB: September 11, 1940  Age: 75 y.o. MRN#: [id number] Attending Physician: Velvet Bathe, MD Primary Care Physician: [name], MD Admit Date: 05/08/2016  Reason for Consultation/Follow-up: Establishing goals of care and Psychosocial/spiritual support  Subjective: Shawna [name] resting quietly in bed. She makes eye contact with me as I enter the room. She has no verbal communication today. Present at bedside are her daughter Shawna [name], and a female friend. We talk about Shawna [name] current health concerns, extubation times 48 hours. We talk about dialysis schedule for tomorrow, we talk about speech therapy evaluation and physical therapy evaluation. Daughter Shawna [name] states that she had planned to return to her own home ([place] later today. I share a diagram of the chronic illness pathway. All questions answered, I encourage Shawna [name] to call me at any point.  Length of Stay: 14  Current Medications: Scheduled Meds:  . amiodarone  200 mg Oral Daily  . aspirin EC  81 mg Oral Daily  . chlorhexidine gluconate (MEDLINE KIT)  15 mL Mouth Rinse BID  . cloNIDine  0.1 mg Oral BID  . epoetin (EPOGEN/PROCRIT) injection  14,000 Units Intravenous Q T,Th,Sa-HD  . famotidine (PEPCID) IV  20 mg Intravenous Q24H  . hydrALAZINE  5 mg Intravenous Once  . mouth rinse  15 mL Mouth Rinse QID  . multivitamin  1 tablet Oral Daily  . piperacillin-tazobactam (ZOSYN)  IV  3.375 g Intravenous Q12H  . polyethylene glycol  17 g Per Tube Daily  . vancomycin  500 mg Intravenous Q T,Th,Sa-HD    Continuous Infusions: . heparin 850 Units/hr (05/23/16 0232)    PRN Meds: sodium chloride, sodium chloride, sodium chloride, sodium chloride, sodium  chloride, sodium chloride, sodium chloride, sodium chloride, acetaminophen, acetaminophen, ALPRAZolam, alteplase, alteplase, fentaNYL (SUBLIMAZE) injection, fentaNYL (SUBLIMAZE) injection, fluticasone, heparin, heparin, hydrALAZINE, ipratropium-albuterol, lidocaine (PF), lidocaine-prilocaine, midazolam, ondansetron (ZOFRAN) IV, pentafluoroprop-tetrafluoroeth  Physical Exam  Constitutional: No distress.  Lying quietly in bed. Makes but does not keep eye contact. Communicates but nonverbal.  HENT:  Head: Normocephalic and atraumatic.  Cardiovascular: Normal rate.   Pulmonary/Chest: Effort normal. No respiratory distress.  Abdominal: Soft. She exhibits no distension.  Musculoskeletal:  Right hand somewhat swollen and warm.  Neurological: She is alert.  No  verbal responses today  Skin: Skin is warm and dry.  Nursing note and vitals reviewed.           Vital Signs: BP (!) 125/113   Pulse 92   Temp 98.4 F (36.9 C)   Resp 20   Ht _0  (1.549 m)   Wt 54.4 kg (119 lb 14.9 oz)   SpO2 100%   BMI 22.66 kg/m  SpO2: SpO2: 100 % O2 Device: O2 Device: Nasal Cannula O2 Flow Rate: O2 Flow Rate (L/min): 4 L/min  Intake/output summary:  Intake/Output Summary (Last 24 hours) at 05/23/16 1526 Last data filed at 05/23/16 1100  Gross per 24 hour  Intake           298.23 ml  Output              800 ml  Net          -501.77 ml   LBM: Last BM Date: 05/20/16 Baseline Weight: Weight: 60.3 kg (133 lb) Most recent weight: Weight: 54.4 kg (119 lb 14.9 oz)       Palliative Assessment/Data:    Flowsheet Rows   Flowsheet Row Most Recent Value  Intake Tab  Referral Department  Hospitalist  Unit at Time of Referral  ICU  Palliative Care Primary Diagnosis  Cardiac  Date Notified  05/12/16  Palliative Care Type  New Palliative care  Reason for referral  Clarify Goals of Care, Advance Care Planning  Date of Admission  05/08/16  Date first seen by Palliative Care  05/12/16  # of days Palliative  referral response time  0 Day(s)  # of days IP prior to Palliative referral  4  Clinical Assessment  Palliative Performance Scale Score  10%  Pain Max last 24 hours  Not able to report  Pain Min Last 24 hours  Not able to report  Dyspnea Max Last 24 Hours  Not able to report  Dyspnea Min Last 24 hours  Not able to report  Psychosocial & Spiritual Assessment  Palliative Care Outcomes  Patient/Family meeting held?  Yes  Who was at the meeting?  Husband  Palliative Care Outcomes  Clarified goals of care, Provided psychosocial or spiritual support  Palliative Care follow-up planned  -- [Follow-up while at [facility]]      Patient Active Problem List   Diagnosis Date Noted  . Respiratory failure ([place])   . Cardiac arrest ([place])   . Palliative care encounter   . DNR (do not resuscitate) discussion   . Goals of care, counseling/discussion   . Hypertensive crisis without congestive heart failure 05/09/2016  . Acute pulmonary edema ([place]) 04/06/2016  . Acute respiratory failure ([place]) 04/06/2016  . Hypertensive crisis 01/27/2016  . History of colon cancer 01/27/2016  . History of ovarian cancer 01/27/2016  . Hypertensive urgency 01/27/2016  . Narrow complex tachycardia ([place]) 09/08/2015  . SVT (supraventricular tachycardia) ([place]) 09/08/2015  . Influenza A 08/30/2015  . Acute on chronic diastolic CHF (congestive heart failure) ([place]) 05/04/2015  . Unstable angina ([place]) 05/03/2015  . Essential hypertension   . Pain in joint, lower leg 08/14/2014  . Chest pain 11/26/2013  . Small bowel obstruction, partial 05/29/2013  . Chronic diastolic CHF (congestive heart failure) ([place]) 03/22/2013  . GI bleeding 03/21/2013  . Acute blood loss anemia 03/21/2013  . Vaginal odor 03/12/2013  . Vaginal discharge 03/12/2013  . Occlusion and stenosis of carotid artery without mention of cerebral infarction 01/24/2013  . Hx of CABG 07/05/2012  .  Carotid artery disease ([facility]) 07/05/2012  . Anemia of chronic kidney  failure 07/03/2012  . Secondary hyperparathyroidism ([facility]) 07/03/2012  . Mitral regurgitation 06/12/2012  . Pneumonia 06/09/2012  . Non-STEMI (non-ST elevated myocardial infarction) ([facility]) 06/08/2012  . GERD (gastroesophageal reflux disease) 01/09/2012  . HLD (hyperlipidemia) 01/05/2012  . Coronary atherosclerosis of native coronary artery 12/16/2011  . ESRD on hemodialysis [facility]) 12/16/2011    Palliative Care Assessment & Plan   Patient Profile: 75 y.o.femalewith past medical history of end-stage renal disease on dialysis for 5 years, Severe hypertension, high cholesterol, anxiety, CAD, chronic diastolic heart failure, [place] cancer in 1992admitted on 10/29/2017with hypertensive crisis.   Assessment: No symptom management needed at this time.  Recommendations/Plan: The [facility] family is requesting all measures be performed at this time including continued CPR.   Mr. Pinkett stated previously that Shawna [name] has been intubated twice in the past.  SNF for rehab discussed with daughter.  Goals of Care and Additional Recommendations:  Limitations on Scope of Treatment: Full Scope Treatment  Code Status:    Code Status Orders        Start     Ordered   05/09/16 0136  Full code  Continuous     05/09/16 0138    Code Status History    Date Active Date Inactive Code Status Order ID Comments User Context   04/06/2016  5:10 AM 04/08/2016  9:06 PM Full Code [id number]  [name], MD Inpatient   01/27/2016 11:13 PM 01/28/2016 10:45 PM Full Code [id number]  Delano Metz, MD Inpatient   09/08/2015  6:06 PM 09/10/2015  7:39 PM Full Code [id number]  Henderson Cloud, MD Inpatient   08/30/2015  5:56 PM 09/03/2015  7:38 PM Full Code [id number]  Standley Brooking, MD Inpatient   05/03/2015  6:09 PM 05/05/2015  4:01 PM Full Code [id number]  Wilson Singer, MD ED   01/21/2015  8:14 PM 01/27/2015  9:00 PM Full Code [id number]  Kathlene Cote, PA-C Inpatient   09/30/2014  3:22 AM 10/02/2014  2:35  PM Full Code [id number]  Floydene Flock, MD Inpatient   08/24/2014  9:47 AM 08/25/2014  6:19 PM Full Code [id number]  Henderson Cloud, MD Inpatient   12/10/2013  3:08 PM 12/10/2013 10:03 PM Full Code [id number]  Corky Crafts, MD Inpatient   11/26/2013  3:48 PM 11/28/2013  9:45 PM Full Code [id number]  Alba Cory, MD ED   05/29/2013 11:08 AM 06/01/2013  4:34 PM Full Code [id number]  Dede Query, MD ED   03/21/2013 12:57 PM 03/27/2013  8:09 PM Full Code [id number]  Jeralyn Bennett, MD Inpatient   09/05/2012 11:23 PM 09/06/2012  4:51 PM Full Code [id number]  Dede Query, MD Inpatient   06/17/2012  9:32 AM 06/20/2012  7:08 PM Full Code [id number]  Delight Ovens, MD Inpatient   06/13/2012  2:25 PM 06/17/2012  9:32 AM Full Code [id number]  Salley Slaughter, RN Inpatient       Prognosis:   Unable to determine, based on outcomes. Complicated by hemodialysis.  Discharge Planning:  Skilled Nursing Facility for rehab with Palliative care service follow-up  Care plan was discussed with nursing staff, case manager, social worker, and Dr. Cena Benton.  Thank you for allowing the Palliative Medicine Team to assist in the care of this patient.   Time In: 0940 Time Out: 1015 Total Time 35 minutes  Prolonged Time Billed  no       Greater  than 50%  of this time was spent counseling and coordinating care related to the above assessment and plan.  Drue Novel, NP  Please contact Palliative Medicine Team phone at [phone] for questions and concerns.

## 2016-05-23 NOTE — Progress Notes (Signed)
Subjective: Interval History: Patient more alert today. She denies any difficulty breathing. She offers no complaints.  Objective: Vital signs in last 24 hours: Temp:  [97.8 F (36.6 C)-99 F (37.2 C)] 97.8 F (36.6 C) (11/13 0400) Pulse Rate:  [25-118] 89 (11/13 0630) Resp:  [13-28] 17 (11/13 0630) BP: (97-213)/(40-135) 150/76 (11/13 0630) SpO2:  [80 %-100 %] 100 % (11/13 0630) Weight:  [54.4 kg (119 lb 14.9 oz)] 54.4 kg (119 lb 14.9 oz) (11/13 0500) Weight change: -0.7 kg (-1 lb 8.7 oz)  Intake/Output from previous day: 11/12 0701 - 11/13 0700 In: 364.2 [I.V.:214.2; IV Piggyback:150] Out: 800 [Stool:800] Intake/Output this shift: No intake/output data recorded.  Patient is alert and in no apparent distress Chest: She has bilateral inspiratory crackles. Heart: RRR  Extremties: No edema  Lab Results:  Recent Labs  05/22/16 0520 05/23/16 0445  WBC 17.4* 15.5*  HGB 7.9* 7.8*  HCT 26.0* 25.2*  PLT 440* 458*   BMET:   Recent Labs  05/21/16 0510  NA 142  K 4.6  CL 101  CO2 24  GLUCOSE 111*  BUN 86*  CREATININE 11.03*  CALCIUM 10.0   No results for input(s): PTH in the last 72 hours. Iron Studies: No results for input(s): IRON, TIBC, TRANSFERRIN, FERRITIN in the last 72 hours.  Studies/Results: No results found.  I have reviewed the patient's current medications.  Assessment/Plan: Problem #1 Respiratory failure:. Patient presently seems to be alert and comfortable. One is on oxygen. Problem #2 hypertension: Her blood pressure continue to fluctuate but presently seems to be stable. Problem #3 end-stage renal disease: She is status post hemodialysis on Saturday. Presently being H is not done. Problem #4 anemia: Her hemoglobin is low but stable. Patient presently on Epogen during dialysis.. Problem #5 fluid management: No significant sign of fluid overload. Problem #6 metabolic bone disease: Her calcium is normal but her phosphorus is high. Not on [place] as  patient was intubated and not on oral feeding Plan: 1] will check a renal panel today. If her potassium is good we'll hold dialysis until tomorrow. However if potassium is high we may consider dialyzing her today. 2] will check renal panel and CBC in the morning. 3] Continue with Epogen with dialysis.   LOS: 14 days   Shawna [name] S 05/23/2016,7:55 AM

## 2016-05-23 NOTE — Evaluation (Signed)
Clinical/Bedside Swallow Evaluation Patient Details  Name: Shawna [name] MRN: [id number] Date of Birth: 1940-07-24  Today's Date: 05/23/2016 Time: SLP Start Time (ACUTE ONLY): 6073 SLP Stop Time (ACUTE ONLY): 1702 SLP Time Calculation (min) (ACUTE ONLY): 29 min  Past Medical History:  Past Medical History:  Diagnosis Date  . Anxiety   . Arthritis   . AVM (arteriovenous malformation) of colon   . CAD (coronary artery disease)    a. 12/2011 NSTEMI/Cath/PCI LCX (2.25x14 Resolute DES) & D1 (2.25x22 Resolute DES);  b. 01/2012 Cath/PCI: LM 30, LAD 30p, 40-60m, D1 stent ok, 99 in sm branch of diag, LCX patent stent, OM1 20, RCA 95 ost (4.0x12 Promus DES), EF 55%;  c. 04/2012 Lexi Cardiolite  EF 48%, small area of scar @ base/mid inflat wall with mild peri-infarct ischemia.; CABG 12/4  . Carotid artery disease ([place])    a. 71-06% LICA, 08/6946   . Chronic bronchitis ([place])   . Chronic diastolic CHF (congestive heart failure) ([place])    a. 02/2012 Echo EF 60-65%, nl wall motion, Gr 1 DD, mod MR  . Colon cancer ([place]) 1992  . Esophageal stricture   . ESRD on hemodialysis ([place])    ESRD due to HTN, started dialysis 2011 and gets HD at [facility] with Dr Hinda Lenis on MWF schedule.  Access is LUA AVF as of Sept 2014.   Marland Kitchen GERD (gastroesophageal reflux disease)   . High cholesterol 12/2011  . History of blood transfusion 07/2011; 12/2011; 01/2012 X 2; 04/2012  . History of gout   . History of lower GI bleeding   . Hypertension   . Iron deficiency anemia   . Mitral regurgitation    a. Moderate by echo, 02/2012  . Myocardial infarction   . Ovarian cancer ([place]) 1992  . Pneumonia ~ 2009  . PUD (peptic ulcer disease)   . TIA (transient ischemic attack)    Past Surgical History:  Past Surgical History:  Procedure Laterality Date  . ABDOMINAL HYSTERECTOMY  1992  . APPENDECTOMY  06/1990  . AV FISTULA PLACEMENT  07/2009   left upper arm  . COLON RESECTION  1992  . CORONARY ANGIOPLASTY WITH STENT PLACEMENT   12/15/11   "2"  . CORONARY ANGIOPLASTY WITH STENT PLACEMENT  y/2013   "1; makes total of 3" (05/02/2012)  . CORONARY ARTERY BYPASS GRAFT  06/13/2012   Procedure: CORONARY ARTERY BYPASS GRAFTING (CABG);  Surgeon: Grace Isaac, MD;  Location: [place];  Service: Open Heart Surgery;  Laterality: N/A;  cabg x four;  using left internal mammary artery, and left leg greater saphenous vein harvested endoscopically  . DILATION AND CURETTAGE OF UTERUS    . ESOPHAGOGASTRODUODENOSCOPY  01/20/2012   Procedure: ESOPHAGOGASTRODUODENOSCOPY (EGD);  Surgeon: Ladene Artist, MD,FACG;  Location: [facility] ENDOSCOPY;  Service: Endoscopy;  Laterality: N/A;  . ESOPHAGOGASTRODUODENOSCOPY N/A 03/26/2013   Procedure: ESOPHAGOGASTRODUODENOSCOPY (EGD);  Surgeon: Irene Shipper, MD;  Location: [facility] ENDOSCOPY;  Service: Endoscopy;  Laterality: N/A;  . ESOPHAGOGASTRODUODENOSCOPY N/A 04/30/2015   Procedure: ESOPHAGOGASTRODUODENOSCOPY (EGD);  Surgeon: Rogene Houston, MD;  [facility]O [facility];  Service: Endoscopy;  Laterality: N/A;  1pm - moved to 10/20 @ 1:10  . INTRAOPERATIVE TRANSESOPHAGEAL ECHOCARDIOGRAM  06/13/2012   Procedure: INTRAOPERATIVE TRANSESOPHAGEAL ECHOCARDIOGRAM;  Surgeon: Grace Isaac, MD;  Location: [place];  Service: Open Heart Surgery;  Laterality: N/A;  . LEFT HEART CATHETERIZATION WITH CORONARY ANGIOGRAM N/A 12/15/2011   Procedure: LEFT HEART CATHETERIZATION WITH CORONARY ANGIOGRAM;  Surgeon: Annita Brod  Angelena Form, MD;  Location: [facility] CATH LAB;  Service: Cardiovascular;  Laterality: N/A;  . LEFT HEART CATHETERIZATION WITH CORONARY ANGIOGRAM N/A 01/10/2012   Procedure: LEFT HEART CATHETERIZATION WITH CORONARY ANGIOGRAM;  Surgeon: Peter M Martinique, MD;  Location: [facility] CATH LAB;  Service: Cardiovascular;  Laterality: N/A;  . LEFT HEART CATHETERIZATION WITH CORONARY ANGIOGRAM N/A 06/08/2012   Procedure: LEFT HEART CATHETERIZATION WITH CORONARY ANGIOGRAM;  Surgeon: Burnell Blanks, MD;  Location: [facility] CATH LAB;  Service:  Cardiovascular;  Laterality: N/A;  . LEFT HEART CATHETERIZATION WITH CORONARY/GRAFT ANGIOGRAM N/A 12/10/2013   Procedure: LEFT HEART CATHETERIZATION WITH Beatrix Fetters;  Surgeon: [name], MD;  Location[facility] C[facility] LAB;  Service: Cardiovascular;  Laterality: N/A;  . OVARY SURGERY     ovarian cancer  . SHUNTOGRAM N/A 10/15/2013   Procedure: Fistulogram;  Surgeon: Serafina Mitchell, MD;  Location: [facility] CATH LAB;  Service: Cardiovascular;  Laterality: N/A;  . THROMBECTOMY / ARTERIOVENOUS GRAFT REVISION  2011   left upper arm  . TUBAL LIGATION  1980's   HPI:  9 yof with a history of CAD and ESRD, presented with acute onset of chest pain unrelieved by aspirin and nitroglycerin. In the emergency department BP 230/130 and she was noted to have new ST depression. She was started on nitroglycerin infusion and IV labetalol and admitted for hypertensive crisis with chest pain. Her chest pain had resolved but she remained hypertensive. On 11/1 patient suddenly became bradycardic and unresponsive. She underwent CPR, chest compressions, received epinephrine and had ROSC. On 11/2 prior to starting her dialysis she was noted to be hypertensive so she was given IV labetalol and developed bradycardia with subsequent PEA arrest. She underwent CPR again for < 5 mins and had ROSC. Pt was intubated 05/11/2016 -weaning has been difficult due to periods of apnea, but was successfully extubated on 05/20/2016.  Pulmonology and Neurology assisting. Patient is currently extubated and has not required bipap during the day. Pulmonology has signed off   Assessment / Plan / Recommendation Clinical Impression  Pt seen at bedside for clinical swallow evaluation due to prolonged intubation (10 days with difficulty wean). Her daughter was present during the evaluation. Pt was alert with intermittent fading of attention. She benefited from verbal and tactile cues. Oral motor examination was unremarkable, pt with own  dentition. She was unable to demonstrate cued swallow without a bolus. Pt presented with ice chips after oral care and pt held bolus without swallow initially. Pt then with delay in swallow initiation with hand over hand assisted self presentations of thin water. Pt with oral holding and delay in swallow initiation without overt coughing, but pt appeared exhibit breath holding and mild audible breathing following and delayed cough. Pt with improved performance with NTL and puree (consumed 4 oz). Recommend D1/puree with NTL and po meds either whole or crushed in puree when pt is alert and upright. SLP to follow while in acute setting. Pt will likely need SNF.     Aspiration Risk  Moderate aspiration risk    Diet Recommendation Dysphagia 1 (Puree);Nectar-thick liquid   Liquid Administration via: Cup Medication Administration: Crushed with puree Supervision: Staff to assist with self feeding;Full supervision/cueing for compensatory strategies Compensations: Minimize environmental distractions;Slow rate;Small sips/bites;Clear throat intermittently Postural Changes: Remain upright for at least 30 minutes after po intake;Seated upright at 90 degrees    Other  Recommendations Oral Care Recommendations: Oral care BID;Staff/trained caregiver to provide oral care Other Recommendations: Order thickener from pharmacy;Prohibited food (jello, ice cream,  thin soups);Remove water pitcher;Clarify dietary restrictions   Follow up Recommendations Skilled Nursing facility      Frequency and Duration min 2x/week  1 week       Prognosis Prognosis for Safe Diet Advancement: Fair Barriers to Reach Goals: Cognitive deficits      Swallow Study   General Date of Onset: 05/08/16 HPI: 52 yof with a history of CAD and ESRD, presented with acute onset of chest pain unrelieved by aspirin and nitroglycerin. In the emergency department BP 230/130 and she was noted to have new ST depression. She was started on  nitroglycerin infusion and IV labetalol and admitted for hypertensive crisis with chest pain. Her chest pain had resolved but she remained hypertensive. On 11/1 patient suddenly became bradycardic and unresponsive. She underwent CPR, chest compressions, received epinephrine and had ROSC. On 11/2 prior to starting her dialysis she was noted to be hypertensive so she was given IV labetalol and developed bradycardia with subsequent PEA arrest. She underwent CPR again for < 5 mins and had ROSC. Pt was intubated 05/11/2016 -weaning has been difficult due to periods of apnea, but was successfully extubated on 05/20/2016.  Pulmonology and Neurology assisting. Patient is currently extubated and has not required bipap during the day. Pulmonology has signed off Type of Study: Bedside Swallow Evaluation Previous Swallow Assessment: None on record Diet Prior to this Study: NPO Temperature Spikes Noted: No Respiratory Status: Nasal cannula History of Recent Intubation: Yes Length of Intubations (days): 10 days Date extubated: 05/20/16 Behavior/Cognition: Alert;Cooperative;Pleasant mood;Confused;Requires cueing Oral Cavity Assessment: Within Functional Limits Oral Care Completed by SLP: Yes Oral Cavity - Dentition: Adequate natural dentition Vision: Functional for self-feeding Self-Feeding Abilities: Needs assist Patient Positioning: Upright in bed Baseline Vocal Quality: Normal;Breathy Volitional Cough: Weak;Congested Volitional Swallow: Unable to elicit    Oral/Motor/Sensory Function Overall Oral Motor/Sensory Function: Within functional limits   Ice Chips Ice chips: Impaired Presentation: Spoon Oral Phase Impairments: Reduced lingual movement/coordination Oral Phase Functional Implications: Oral holding Pharyngeal Phase Impairments: Suspected delayed Swallow   Thin Liquid Thin Liquid: Impaired Presentation: Cup;Spoon Oral Phase Impairments: Reduced lingual movement/coordination Oral Phase  Functional Implications: Oral holding Pharyngeal  Phase Impairments: Suspected delayed Swallow;Wet Vocal Quality Other Comments: audible swallow    Nectar Thick Nectar Thick Liquid: Impaired Presentation: Cup;Straw;Self Fed (hand over hand assist) Oral Phase Impairments: Reduced labial seal   Honey Thick Honey Thick Liquid: Not tested   Puree Puree: Within functional limits Presentation: Spoon   Solid   Thank you,  Genene Churn, CCC-SLP [phone]    Solid: Not tested        Deandra Gadson 05/23/2016,5:11 PM

## 2016-05-23 NOTE — Clinical Social Work Note (Signed)
Clinical Social Work Assessment  Patient Details  Name: Shawna [name] MRN: [id number] Date of Birth: 1941-01-10  Date of referral:  05/23/16               Reason for consult:  Discharge Planning, Facility Placement                Permission sought to share information with:    Permission granted to share information::     Name::        Agency::     Relationship::     Contact Information:  Shawna [name], daughter, listed on chart  Housing/Transportation Living arrangements for the past 2 months:  [place] of Information:  Adult Children Patient Interpreter Needed:  None Criminal Activity/Legal Involvement Pertinent to Current Situation/Hospitalization:  No - Comment as needed Significant Relationships:  Spouse, Adult Children Lives with:  Spouse Do you feel safe going back to the place where you live?  Yes Need for family participation in patient care:  Yes (Comment)  Care giving concerns: None identified at baseline.    Social Worker assessment / plan:  Patient's daughter, Shawna [name], stated that patient lives at home with her husband, ambulates and completes ADLs unassisted, cooks and drives infrequently. She state that patient has been in rehab before in the past. She state that patient goes to dialysis at [facility] on MWF in [place] and that she would prefer if patient stayed in the [place] area but she would consider PNC. CSW discussed that if patient went to a facility in [place], she would likely have to switch dialysis centers to [address].   Employment status:  Retired Nurse, adult PT Recommendations:  Not assessed at this time Information / Referral to community resources:  [place]  Patient/Family's Response to care:  Family is agreeable for patient to go to short term rehab.  Patient/Family's Understanding of and Emotional Response to Diagnosis, Current Treatment, and Prognosis:  Patient and family understand  patient's diagnosis, treatment and prognosis.     Emotional Assessment Appearance:  Appears stated age Attitude/Demeanor/Rapport:   (Cooperative/pleasant) Affect (typically observed):  Accepting Orientation:  Oriented to Self, Oriented to Place, Oriented to Situation Alcohol / Substance use:    Psych involvement (Current and /or in the community):     Discharge Needs  Concerns to be addressed:  Discharge Planning Concerns Readmission within the last 30 days:  Yes Current discharge risk:  None Barriers to Discharge:  No Barriers Identified   Ihor Gully, LCSW 05/23/2016, 12:44 PM

## 2016-05-24 LAB — GLUCOSE, CAPILLARY
GLUCOSE-CAPILLARY: 107 mg/dL — AB (ref 65–99)
GLUCOSE-CAPILLARY: 81 mg/dL (ref 65–99)
GLUCOSE-CAPILLARY: 114 mg/dL — AB (ref 65–99)
Glucose-Capillary: 102 mg/dL — ABNORMAL HIGH (ref 65–99)
Glucose-Capillary: 104 mg/dL — ABNORMAL HIGH (ref 65–99)
Glucose-Capillary: 82 mg/dL (ref 65–99)

## 2016-05-24 LAB — HEPARIN LEVEL (UNFRACTIONATED): Heparin Unfractionated: 0.35 [IU]/mL (ref 0.30–0.70)

## 2016-05-24 LAB — PREPARE RBC (CROSSMATCH)

## 2016-05-24 LAB — CBC
HCT: 22.2 % — ABNORMAL LOW (ref 36.0–46.0)
HEMOGLOBIN: 6.9 g/dL — AB (ref 12.0–15.0)
MCH: 30.7 pg (ref 26.0–34.0)
MCHC: 31.1 g/dL (ref 30.0–36.0)
MCV: 98.7 fL (ref 78.0–100.0)
PLATELETS: 402 10*3/uL — AB (ref 150–400)
RBC: 2.25 MIL/uL — AB (ref 3.87–5.11)
RDW: 17.3 % — ABNORMAL HIGH (ref 11.5–15.5)
WBC: 13 10*3/uL — ABNORMAL HIGH (ref 4.0–10.5)

## 2016-05-24 LAB — RENAL FUNCTION PANEL
ALBUMIN: 2.7 g/dL — AB (ref 3.5–5.0)
ANION GAP: 18 — AB (ref 5–15)
BUN: 86 mg/dL — ABNORMAL HIGH (ref 6–20)
CALCIUM: 9.9 mg/dL (ref 8.9–10.3)
CO2: 24 mmol/L (ref 22–32)
CREATININE: 10.89 mg/dL — AB (ref 0.44–1.00)
Chloride: 99 mmol/L — ABNORMAL LOW (ref 101–111)
GFR calc Af Amer: 3 mL/min — ABNORMAL LOW (ref >60)
GFR calc non Af Amer: 3 mL/min — ABNORMAL LOW (ref >60)
GLUCOSE: 110 mg/dL — AB (ref 65–99)
PHOSPHORUS: 8.4 mg/dL — AB (ref 2.5–4.6)
Potassium: 3.6 mmol/L (ref 3.5–5.1)
SODIUM: 141 mmol/L (ref 135–145)

## 2016-05-24 LAB — VANCOMYCIN, RANDOM: Vancomycin Rm: 25

## 2016-05-24 MED ORDER — EPOETIN ALFA 10000 UNIT/ML IJ SOLN
INTRAMUSCULAR | Status: AC
  Filled 2016-05-24: qty 1

## 2016-05-24 MED ORDER — EPOETIN ALFA 10000 UNIT/ML IJ SOLN
10000.0000 [IU] | INTRAMUSCULAR | Status: DC
  Filled 2016-05-24: qty 1

## 2016-05-24 MED ORDER — EPOETIN ALFA 10000 UNIT/ML IJ SOLN
10000.0000 [IU] | INTRAMUSCULAR | Status: DC
  Administered 2016-05-24: 10000 [IU] via SUBCUTANEOUS
  Filled 2016-05-24 (×2): qty 1

## 2016-05-24 MED ORDER — EPOETIN ALFA 4000 UNIT/ML IJ SOLN
4000.0000 [IU] | INTRAMUSCULAR | Status: DC
  Filled 2016-05-24: qty 1

## 2016-05-24 MED ORDER — EPOETIN ALFA 4000 UNIT/ML IJ SOLN
INTRAMUSCULAR | Status: AC
  Filled 2016-05-24: qty 1

## 2016-05-24 MED ORDER — SODIUM CHLORIDE 0.9 % IV SOLN: Freq: Once | INTRAVENOUS | Status: DC

## 2016-05-24 MED ORDER — EPOETIN ALFA 4000 UNIT/ML IJ SOLN
4000.0000 [IU] | INTRAMUSCULAR | Status: DC
  Administered 2016-05-24: 4000 [IU] via SUBCUTANEOUS
  Filled 2016-05-24 (×2): qty 1

## 2016-05-24 NOTE — Progress Notes (Signed)
Pharmacy Antibiotic Note  Shawna [name] is a 75 y.o. female admitted on 05/08/2016 with cellulitis.  Pharmacy has been consulted for Vancomycin and Zosyn dosing. Cultures  are negative to date.  Pre-dialysis vancomycin level is on target. MRSA PCR (-).    Plan: Vancomycin 500mg  IV every HD. (consider d/c >> defer to MD) Zosyn 3.375gm IV every 12 hours.  Height: 5\' 1"  (154.9 cm) Weight: 122 lb 6.4 oz (55.5 kg) IBW/kg (Calculated) : 47.8  Temp (24hrs), Avg:98 F (36.7 C), Min:97.3 F (36.3 C), Max:98.4 F (36.9 C)   Recent Labs Lab 05/17/16 2251 05/18/16 0139  05/19/16 0423 05/20/16 0420 05/21/16 0510 05/22/16 0520 05/23/16 0445 05/23/16 0908 05/24/16 0639 05/24/16 0640  WBC  --  10.4  < > 19.0* 18.2* 16.9* 17.4* 15.5*  --  13.0*  --   CREATININE  --  7.28*  < > 9.94*  10.20* 8.74* 11.03*  --   --  9.19* 10.89*  --   LATICACIDVEN 1.2 1.1  --   --   --   --   --   --   --   --   --   VANCORANDOM  --   --   --   --   --   --   --   --   --   --  25  < > = values in this interval not displayed.  Estimated Creatinine Clearance: 3.3 mL/min (by C-G formula based on SCr of 10.89 mg/dL (H)).    Allergies  Allergen Reactions  . Aspirin Other (See Comments)    Mess up her stomach; "makes my bowels have blood in them". Takes 81 mg EC Aspirin   . Contrast Media [Iodinated Diagnostic Agents] Itching  . Iron Itching and Other (See Comments)    "they gave me iron in dialysis; had to give me Benadryl cause I had to have the iron" (05/02/2012)  . Penicillins Other (See Comments)    "makes me real weak when I take it; like I'll pass out"Has patient had a PCN reaction causing immediate rash, facial/tongue/throat swelling, SOB or lightheadedness with hypotension: Nono Has patient had a PCN reaction causing severe rash involving mucus membranes or skin necrosis: Nono Has patient had a PCN reaction that required hospitalization Nono Has patient had a PCN reaction occurring within the last 10  years: Nono If all of the above  . Plavix [Clopidogrel Bisulfate] Rash  . Amlodipine Swelling    Leg swelling  . Bactrim [Sulfamethoxazole-Trimethoprim] Rash  . Dexilant [Dexlansoprazole] Other (See Comments)    Upset stomach  . Morphine And Related Itching    Itching in feet  . Nitrofurantoin Hives  . Prilosec [Omeprazole] Other (See Comments)    "back spasms"  . Venofer [Ferric Oxide] Itching    Patient reports using Benadryl prior to doses as [facility]  . Levaquin [Levofloxacin In D5w] Rash  . Protonix [Pantoprazole Sodium] Rash   Antimicrobials this admission: Zosyn 11/7 >>  Vanc 11/7 >>   Dose adjustments this admission: ESRD dosing  Microbiology results: 11/7 BCx: pending 10/30 MRSA PCR: (-)  Thank you for allowing pharmacy to be a part of this patient's care.  Hart Robinsons A 05/24/2016 10:43 AM

## 2016-05-24 NOTE — NC FL2 (Deleted)
[place] MEDICAID FL2 LEVEL OF CARE SCREENING TOOL     IDENTIFICATION  Patient Name: Shawna [name] Birthdate: 10-Sep-1940 Sex: female Admission Date (Current Location): 05/08/2016  [facility] and [place] Number:  [employer] and Address:  [facility] [address], [place]      Provider Number: [id number]  Attending Physician Name and Address:  Velvet Bathe, MD  Relative Name and Phone Number:       Current Level of Care: Hospital Recommended Level of Care: [place] Prior Approval Number:    Date Approved/Denied:   PASRR Number:    Discharge Plan: SNF    Current Diagnoses: Patient Active Problem List   Diagnosis Date Noted  . Respiratory failure ([place])   . Cardiac arrest ([place])   . Palliative care encounter   . DNR (do not resuscitate) discussion   . Goals of care, counseling/discussion   . Hypertensive crisis without congestive heart failure 05/09/2016  . Acute pulmonary edema ([place]) 04/06/2016  . Acute respiratory failure ([place]) 04/06/2016  . Hypertensive crisis 01/27/2016  . History of colon cancer 01/27/2016  . History of ovarian cancer 01/27/2016  . Hypertensive urgency 01/27/2016  . Narrow complex tachycardia ([place]) 09/08/2015  . SVT (supraventricular tachycardia) ([place]) 09/08/2015  . Influenza A 08/30/2015  . Acute on chronic diastolic CHF (congestive heart failure) ([place]) 05/04/2015  . Unstable angina ([place]) 05/03/2015  . Essential hypertension   . Pain in joint, lower leg 08/14/2014  . Chest pain 11/26/2013  . Small bowel obstruction, partial 05/29/2013  . Chronic diastolic CHF (congestive heart failure) ([place]) 03/22/2013  . GI bleeding 03/21/2013  . Acute blood loss anemia 03/21/2013  . Vaginal odor 03/12/2013  . Vaginal discharge 03/12/2013  . Occlusion and stenosis of carotid artery without mention of cerebral infarction 01/24/2013  . Hx of CABG 07/05/2012  . Carotid artery disease ([place]) 07/05/2012  .  Anemia of chronic kidney failure 07/03/2012  . Secondary hyperparathyroidism ([place]) 07/03/2012  . Mitral regurgitation 06/12/2012  . Pneumonia 06/09/2012  . Non-STEMI (non-ST elevated myocardial infarction) ([place]) 06/08/2012  . GERD (gastroesophageal reflux disease) 01/09/2012  . HLD (hyperlipidemia) 01/05/2012  . Coronary atherosclerosis of native coronary artery 12/16/2011  . ESRD on hemodialysis ([place]) 12/16/2011    Orientation RESPIRATION BLADDER Height & Weight     Time, Situation, Self, Place  O2 (4L) Continent Weight: 122 lb 6.4 oz (55.5 kg) Height:  5' 1" (154.9 cm)  BEHAVIORAL SYMPTOMS/MOOD NEUROLOGICAL BOWEL NUTRITION STATUS      Continent Diet (DIET DYS 1. Nectar Thick 1:1 feeder assist; aspiration and reflux precautions. PO only when alert and upright. PO meds whole or crushed as able in puree. )  AMBULATORY STATUS COMMUNICATION OF NEEDS Skin   Limited Assist Verbally Normal                       Personal Care Assistance Level of Assistance  Bathing, Dressing, Feeding Bathing Assistance: Limited assistance Feeding assistance: Independent Dressing Assistance: Limited assistance     Functional Limitations Info  Sight, Hearing, Speech Sight Info: Adequate Hearing Info: Adequate Speech Info: Adequate    SPECIAL CARE FACTORS FREQUENCY  PT (By licensed PT), Speech therapy     PT Frequency: 5x/week       Speech Therapy Frequency: min 2x/week      Contractures      Additional Factors Info  Code Status, Allergies, Psychotropic Code Status Info: Full code Allergies Info: Aspirin, Contrast  Media, Iron, Penicillins, Plavix, Amlodipine, Bactrim, Dexilant, Morphine and related, Nitrofurantoin, Prilosec, Venofer, Levaquin, Protonix,  Psychotropic Info: Xanax         Current Medications (05/24/2016):  This is the current hospital active medication list Current Facility-Administered Medications  Medication Dose Route Frequency Provider Last Rate Last Dose  .  0.9 %  sodium chloride infusion  100 mL Intravenous PRN Fran Lowes, MD      . 0.9 %  sodium chloride infusion  100 mL Intravenous PRN Fran Lowes, MD      . 0.9 %  sodium chloride infusion  100 mL Intravenous PRN Fran Lowes, MD      . 0.9 %  sodium chloride infusion  100 mL Intravenous PRN Fran Lowes, MD      . 0.9 %  sodium chloride infusion  100 mL Intravenous PRN Fran Lowes, MD      . 0.9 %  sodium chloride infusion  100 mL Intravenous PRN Fran Lowes, MD      . 0.9 %  sodium chloride infusion  100 mL Intravenous PRN Fran Lowes, MD      . 0.9 %  sodium chloride infusion  100 mL Intravenous PRN Fran Lowes, MD      . 0.9 %  sodium chloride infusion   Intravenous Once Velvet Bathe, MD      . acetaminophen (TYLENOL) suppository 650 mg  650 mg Rectal Q4H PRN [name], NP      . acetaminophen (TYLENOL) tablet 650 mg  650 mg Oral Q4H PRN Vianne Bulls, MD      . ALPRAZolam Duanne Moron) tablet 0.25-0.5 mg  0.25-0.5 mg Oral QHS PRN Vianne Bulls, MD   0.5 mg at 05/23/16 2305  . alteplase (CATHFLO ACTIVASE) injection 2 mg  2 mg Intracatheter Once PRN Fran Lowes, MD      . alteplase (CATHFLO ACTIVASE) injection 2 mg  2 mg Intracatheter Once PRN Fran Lowes, MD      . amiodarone (PACERONE) tablet 200 mg  200 mg Oral Daily Vianne Bulls, MD   200 mg at 05/19/16 0911  . aspirin EC tablet 81 mg  81 mg Oral Daily Vianne Bulls, MD   81 mg at 05/16/16 1000  . chlorhexidine gluconate (MEDLINE KIT) (PERIDEX) 0.12 % solution 15 mL  15 mL Mouth Rinse BID Sinda Du, MD   15 mL at 05/23/16 2156  . cloNIDine (CATAPRES) tablet 0.1 mg  0.1 mg Oral BID Ritta Slot, NP   0.1 mg at 05/23/16 2305  . epoetin alfa (EPOGEN,PROCRIT) injection 14,000 Units  14,000 Units Intravenous Q T,Th,Sa-HD Fran Lowes, MD   14,000 Units at 05/21/16 2000  . famotidine (PEPCID) IVPB 20 mg premix  20 mg Intravenous Q24H Kathie Dike, MD   20 mg at  05/23/16 1020  . fentaNYL (SUBLIMAZE) injection 50 mcg  50 mcg Intravenous Q15 min PRN Sinda Du, MD   50 mcg at 05/17/16 1529  . fentaNYL (SUBLIMAZE) injection 50 mcg  50 mcg Intravenous Q2H PRN Sinda Du, MD   50 mcg at 05/22/16 2158  . fluticasone (FLONASE) 50 MCG/ACT nasal spray 2 spray  2 spray Each Nare Daily PRN Ilene Qua Opyd, MD      . heparin ADULT infusion 100 units/mL (25000 units/236m sodium chloride 0.45%)  850 Units/hr Intravenous Continuous OVelvet Bathe MD 8.5 mL/hr at 05/24/16 0640 850 Units/hr at 05/24/16 0640  . heparin injection 1,000 Units  1,000 Units Dialysis PRN BFran Lowes  MD      . heparin injection 1,000 Units  1,000 Units Dialysis PRN Fran Lowes, MD      . hydrALAZINE (APRESOLINE) injection 5 mg  5 mg Intravenous Once Ritta Slot, NP      . hydrALAZINE (APRESOLINE) injection 5 mg  5 mg Intravenous Q6H PRN Velvet Bathe, MD   5 mg at 05/22/16 2007  . ipratropium-albuterol (DUONEB) 0.5-2.5 (3) MG/3ML nebulizer solution 3 mL  3 mL Nebulization Q4H PRN Vianne Bulls, MD   3 mL at 05/11/16 0508  . lidocaine (PF) (XYLOCAINE) 1 % injection 5 mL  5 mL Intradermal PRN Fran Lowes, MD      . lidocaine-prilocaine (EMLA) cream 1 application  1 application Topical PRN Fran Lowes, MD      . MEDLINE mouth rinse  15 mL Mouth Rinse QID Sinda Du, MD   15 mL at 05/24/16 0440  . midazolam (VERSED) injection 1 mg  1 mg Intravenous Q2H PRN Sinda Du, MD   1 mg at 05/19/16 2222  . multivitamin (RENA-VIT) tablet 1 tablet  1 tablet Oral Daily Vianne Bulls, MD   1 tablet at 05/19/16 0911  . ondansetron (ZOFRAN) injection 4 mg  4 mg Intravenous Q6H PRN Vianne Bulls, MD      . pentafluoroprop-tetrafluoroeth (GEBAUERS) aerosol 1 application  1 application Topical PRN Fran Lowes, MD      . piperacillin-tazobactam (ZOSYN) IVPB 3.375 g  3.375 g Intravenous Q12H Vianne Bulls, MD   3.375 g at 05/23/16 2159  . polyethylene glycol (MIRALAX /  GLYCOLAX) packet 17 g  17 g Per Tube Daily Kathie Dike, MD   17 g at 05/18/16 1126  . vancomycin (VANCOCIN) 500 mg in sodium chloride 0.9 % 100 mL IVPB  500 mg Intravenous Q T,Th,Sa-HD Vianne Bulls, MD   500 mg at 05/21/16 1136     Discharge Medications: Please see discharge summary for a list of discharge medications.  Relevant Imaging Results:  Relevant Lab Results:   Additional Information SSN [id number], Clydene Pugh, LCSW

## 2016-05-24 NOTE — Progress Notes (Signed)
PROGRESS NOTE    Shawna [name]  UJW:[id number] DOB: [birth year] DOA: 05/08/2016 PCP: [name], MD    Brief Narrative:  10 yof with a history of CAD and ESRD, presented with acute onset of chest pain unrelieved by aspirin and nitroglycerin. In the emergency department BP 230/130 and she was noted to have new ST depression. She was started on nitroglycerin infusion and IV labetalol and admitted for hypertensive crisis with chest pain.Her chest pain had resolved but she remained hypertensive. On 11/1 patient suddenly became bradycardic and unresponsive. She underwent CPR, chest compressions, received epinephrine and had ROSC. On 11/2 prior to starting her dialysis she was noted to be hypertensive so she was given IV labetalol and developed bradycardia with subsequent PEA arrest. She underwent CPR again for < 5 mins and had ROSC. Weaning has been difficult due to periods of apnea. Pulmonology and Neurology assisting. Patient is currently extubated and has not required bipap during the day. Pulmonology has signed off  Assessment & Plan:   Principal Problem:   Hypertensive crisis Active Problems:   Coronary atherosclerosis of native coronary artery   ESRD on hemodialysis (HCC)   Anemia of chronic kidney failure   Chronic diastolic CHF (congestive heart failure) (HCC)   Chest pain   Acute respiratory failure ([place])   Hypertensive crisis without congestive heart failure   Cardiac arrest [facility])   Palliative care encounter   DNR (do not resuscitate) discussion   Goals of care, counseling/discussion  1. Bradycardia with PEA cardiac arrest. On 11/1, patient's heart rate became bradycardic into PEA. She underwent CPR, received epinephrine, bicarbonate and calcium chloride. She had ROSC after approximately 25 mins of CPR. Lab work had indicated an elevated potassium of 6.4, which may have been cause. She was brieflystarted on a epinephrine infusion which was quicklyweaned off. CXR did not show any  acute process. Cardiology following. Resolved 2. Acute respiratory failure: Ventilator dependent. Patient intubated while undergoing CPR. Pulmonology following. She is undergoing daily weaning trials. She remains hemodynamically stable. Weaning has been unsuccessful due to intermittent periods of apnea. Pt currently extubated. Pulmonology has signed off will transition to floor. Speech therapy has recommended dysphagia 1 diet. Will consider narrowing antibiotic regimen next am. 3. Hypertensive crisis with chest pain. Chest pain resolved. Cards has been on board. Blood pressures have been labile. Patient has periods where her blood pressures are high and then periods where they are soft. I do not want to be overly aggressive with her blood pressures as she has seen drops in her blood pressures as well. As such will continue prn hydralazine.  4. Elevated troponin. Patient has a history of CAD status post CABG and stenting. Patient had no evidence of ACS on admission. Since that time, her troponin has started to rise. Cardiology following and felt that this may be demand ischemia/related to CPR. Troponin peak was 1.5 and has since trended down since.  5. ESRD on HD MWF. Nephrology is following for dialysis needs.  6. Chronic diastolic congestive heart failure. Patient has an echocardiogram on 09/2015 with an EF of 56-21%, grade 2 diastolic dysfunction. Continue fluid management with dialysis. 7. PSVT, maintained on amiodarone. EKG remains in sinus rhythm.  8. Myoclonus. Suspect secondary to CKD/ESRD on HD. No history or objective findings to suggest acute CNS process. Reviewed meds, no likely offenders. Neurology on board. 9. Hyperkalemia. Resolved.  10. Elevated TSH. Recheck as an outpatient. 11. Anemia of chronic kidney disease. Hemoglobin currently stable. Will continue to monitor.  12. Right hand swelling. Started on IV antibiotics due to concern of cellulitis. Xray of hand does not show any gas. Venous  dopplers reporting stable appearance of thrombus in the right basilic vein. on IV heparin  DVT prophylaxis: Heparin infusion Code Status: FULL  Family Communication: husband at bedside Disposition Plan: continue to monitor on floor. Consider narrowing antibiotic regimen to oral antibiotics if continued improvement and lack of fever in the next 24 hours.   Consultants:   Nephrology   Cardiology  Pulmonology  Nutrition   Neurology  Procedures:   Intubation 11/1>>  Antimicrobials  Vancomycin 11/7>>  Zosyn 11/7>>   Subjective: Pt shakes her head yes when asked if she feels better. No new complaints reported.  Objective: Vitals:   05/24/16 1230 05/24/16 1300 05/24/16 1315 05/24/16 1320  BP: (!) 119/56 117/62 (!) 112/59 (!) 147/62  Pulse: 99 91 83 89  Resp: 20  20 20   Temp:    97.3 F (36.3 C)  TempSrc:    Oral  SpO2:      Weight:      Height:        Intake/Output Summary (Last 24 hours) at 05/24/16 1530 Last data filed at 05/24/16 1315  Gross per 24 hour  Intake              292 ml  Output              500 ml  Net             -208 ml   Filed Weights   05/23/16 0500 05/24/16 0300 05/24/16 1015  Weight: 54.4 kg (119 lb 14.9 oz) 55.5 kg (122 lb 6.4 oz) 55.5 kg (122 lb 6.4 oz)    Examination:  General exam: Appears calm and comfortable, in nad. Respiratory system: Clear to auscultation. Respiratory effort normal. Cardiovascular system: S1 & S2 heard, RRR. No JVD, murmurs, rubs, gallops or clicks. No pedal edema. Gastrointestinal system: Abdomen is nondistended, soft and nontender. No organomegaly or masses felt. Normal bowel sounds heard. Central nervous system: limited non verbal responses to examiner. No facial asymmetry Extremities: right hand is edematous. Pulses are palpable. Erythema and warmth noted over hand. Skin: No rashes, lesions or ulcers Psychiatry: cannot assess    Data Reviewed: I have personally reviewed following labs and imaging  studies  CBC:  Recent Labs Lab 05/20/16 0420 05/21/16 0510 05/22/16 0520 05/23/16 0445 05/24/16 0639  WBC 18.2* 16.9* 17.4* 15.5* 13.0*  HGB 8.4* 8.0* 7.9* 7.8* 6.9*  HCT 27.6* 26.7* 26.0* 25.2* 22.2*  MCV 100.4* 101.9* 100.4* 99.6 98.7  PLT 368 395 440* 458* 606*   Basic Metabolic Panel:  Recent Labs Lab 05/19/16 0423 05/20/16 0420 05/21/16 0510 05/23/16 0908 05/24/16 0639  NA 137  138 141 142 141 141  K 4.1  4.1 3.5 4.6 3.6 3.6  CL 95*  96* 100* 101 99* 99*  CO2 25  26 26 24 26 24   GLUCOSE 181*  181* 164* 111* 95 110*  BUN 70*  71* 64* 86* 69* 86*  CREATININE 9.94*  10.20* 8.74* 11.03* 9.19* 10.89*  CALCIUM 8.9  9.0 9.4 10.0 9.9 9.9  PHOS 8.0* 5.1* 8.4* 8.2* 8.4*   GFR: Estimated Creatinine Clearance: 3.3 mL/min (by C-G formula based on SCr of 10.89 mg/dL (H)). Liver Function Tests:  Recent Labs Lab 05/19/16 0423 05/20/16 0420 05/21/16 0510 05/23/16 0908 05/24/16 0639  AST 36  --   --   --   --  ALT 178*  --   --   --   --   ALKPHOS 75  --   --   --   --   BILITOT 0.9  --   --   --   --   PROT 6.4*  --   --   --   --   ALBUMIN 2.8*  2.8* 2.9* 2.8* 2.8* 2.7*   No results for input(s): LIPASE, AMYLASE in the last 168 hours. No results for input(s): AMMONIA in the last 168 hours. Coagulation Profile: No results for input(s): INR, PROTIME in the last 168 hours. Cardiac Enzymes:  Recent Labs Lab 05/19/16 1113  CKTOTAL 33*  CKMB 1.5   BNP (last 3 results) No results for input(s): PROBNP in the last 8760 hours. HbA1C: No results for input(s): HGBA1C in the last 72 hours. CBG:  Recent Labs Lab 05/23/16 1729 05/23/16 2209 05/24/16 0011 05/24/16 0434 05/24/16 0805  GLUCAP 135* 96 102* 107* 104*   Lipid Profile: No results for input(s): CHOL, HDL, LDLCALC, TRIG, CHOLHDL, LDLDIRECT in the last 72 hours. Thyroid Function Tests: No results for input(s): TSH, T4TOTAL, FREET4, T3FREE, THYROIDAB in the last 72 hours. Anemia Panel: No  results for input(s): VITAMINB12, FOLATE, FERRITIN, TIBC, IRON, RETICCTPCT in the last 72 hours. Sepsis Labs:  Recent Labs Lab 05/17/16 2251 05/18/16 0139  LATICACIDVEN 1.2 1.1    Recent Results (from the past 240 hour(s))  Culture, blood (routine x 2)     Status: None   Collection Time: 05/17/16 10:49 PM  Result Value Ref Range Status   Specimen Description BLOOD PICC  Final   Special Requests BOTTLES DRAWN AEROBIC AND ANAEROBIC 10CC EACH  Final   Culture NO GROWTH 5 DAYS  Final   Report Status 05/22/2016 FINAL  Final  Culture, blood (routine x 2)     Status: None   Collection Time: 05/17/16 10:51 PM  Result Value Ref Range Status   Specimen Description BLOOD PICC  Final   Special Requests BOTTLES DRAWN AEROBIC AND ANAEROBIC 10CC EACH  Final   Culture NO GROWTH 5 DAYS  Final   Report Status 05/22/2016 FINAL  Final     Radiology Studies: No results found.  Scheduled Meds: . sodium chloride   Intravenous Once  . amiodarone  200 mg Oral Daily  . aspirin EC  81 mg Oral Daily  . chlorhexidine gluconate (MEDLINE KIT)  15 mL Mouth Rinse BID  . cloNIDine  0.1 mg Oral BID  . epoetin alfa      . epoetin alfa      . epoetin (EPOGEN/PROCRIT) injection  10,000 Units Subcutaneous Q T,Th,Sa-HD   And  . epoetin (EPOGEN/PROCRIT) injection  4,000 Units Subcutaneous Q T,Th,Sa-HD  . famotidine (PEPCID) IV  20 mg Intravenous Q24H  . hydrALAZINE  5 mg Intravenous Once  . mouth rinse  15 mL Mouth Rinse QID  . multivitamin  1 tablet Oral Daily  . piperacillin-tazobactam (ZOSYN)  IV  3.375 g Intravenous Q12H  . polyethylene glycol  17 g Per Tube Daily  . vancomycin  500 mg Intravenous Q T,Th,Sa-HD   Continuous Infusions: . heparin 850 Units/hr (05/24/16 0640)     LOS: 15 days   Time spent: 25 minutes   Jesus Nevills [facility] If 7PM-7AM, please contact night-coverage [email or web address] Password [email or web address] 05/24/2016, 3:30 PM

## 2016-05-24 NOTE — Clinical Social Work Placement (Signed)
   CLINICAL SOCIAL WORK PLACEMENT  NOTE  Date:  05/24/2016  Patient Details  Name: Shawna [name] MRN: [id number] Date of Birth: 10-Nov-1940  Clinical Social Work is seeking post-discharge placement for this patient at the [place] level of care (*CSW will initial, date and re-position this form in  chart as items are completed):  Yes   Patient/family provided with [place] Work Department's list of facilities offering this level of care within the geographic area requested by the patient (or if unable, by the patient's family).  Yes   Patient/family informed of their freedom to choose among providers that offer the needed level of care, that participate in Medicare, Medicaid or managed care program needed by the patient, have an available bed and are willing to accept the patient.  Yes   Patient/family informed of [place]'s ownership interest in [facility] and [facility], as well as of the fact that they are under no obligation to receive care at these facilities.  PASRR submitted to EDS on       PASRR number received on       Existing PASRR number confirmed on 05/23/16     FL2 transmitted to all facilities in geographic area requested by pt/family on       FL2 transmitted to all facilities within larger geographic area on       Patient informed that his/her managed care company has contracts with or will negotiate with certain facilities, including the following:            Patient/family informed of bed offers received.  Patient chooses bed at       Physician recommends and patient chooses bed at      Patient to be transferred to   on  .  Patient to be transferred to facility by       Patient family notified on   of transfer.  Name of family member notified:        PHYSICIAN       Additional Comment:    _______________________________________________ Ihor Gully, LCSW 05/24/2016, 10:55 AM

## 2016-05-24 NOTE — Progress Notes (Signed)
Subjective: Interval History: Patient is sleeping. I wake her up but she went back to sleep. According to her husband she slept good last night. She offers no complaints.  Objective: Vital signs in last 24 hours: Temp:  [97.3 F (36.3 C)-98.4 F (36.9 C)] 97.3 F (36.3 C) (11/14 0454) Pulse Rate:  [92-101] 92 (11/14 0454) Resp:  [14-25] 20 (11/14 0454) BP: (125-205)/(69-113) 164/69 (11/14 0454) SpO2:  [100 %] 100 % (11/14 0454) FiO2 (%):  [36 %] 36 % (11/13 1000) Weight:  [55.5 kg (122 lb 6.4 oz)] 55.5 kg (122 lb 6.4 oz) (11/14 0300) Weight change: 1.12 kg (2 lb 7.5 oz)  Intake/Output from previous day: 11/13 0701 - 11/14 0700 In: 206 [I.V.:136; IV Piggyback:50] Out: 300 [Stool:300] Intake/Output this shift: No intake/output data recorded.  As stated above patient is sleeping but arousable. She goes back when I was talking with her. According to her husband she talk with him this morning before she went back to sleep. Chest: She has bilateral inspiratory crackles. Heart: RRR  Extremties: No edema  Lab Results:  Recent Labs  05/23/16 0445 05/24/16 0639  WBC 15.5* 13.0*  HGB 7.8* 6.9*  HCT 25.2* 22.2*  PLT 458* 402*   BMET:   Recent Labs  05/23/16 0908 05/24/16 0639  NA 141 141  K 3.6 3.6  CL 99* 99*  CO2 26 24  GLUCOSE 95 110*  BUN 69* 86*  CREATININE 9.19* 10.89*  CALCIUM 9.9 9.9   No results for input(s): PTH in the last 72 hours. Iron Studies: No results for input(s): IRON, TIBC, TRANSFERRIN, FERRITIN in the last 72 hours.  Studies/Results: No results found.  I have reviewed the patient's current medications.  Assessment/Plan: Problem #1 Respiratory failure:.Patient on as an oxygen seems to be stable Problem #2 hypertension: Her blood pressure continue to fluctuate but presently seems to be stable. Problem #3 end-stage renal disease: She is status post hemodialysis on Saturday. Her potassium normal Problem #4 anemia: Her hemoglobin continued to  decline.  Problem #5 fluid management: No significant sign of fluid overload. Problem #6 metabolic bone disease: Her calcium is normal but her phosphorus is high  Plan: 1] will make arrangements for dialysis today 2] We'.ll check renal panel and CBC in the morning. 3] Continue with Epogen with dialysis. 4] if her hemoglobin continues to decline patient may require blood transfusion.   LOS: 15 days   Martyna Thorns S 05/24/2016,7:51 AM

## 2016-05-24 NOTE — Progress Notes (Signed)
Patient arrive at dialysis unit via bed.  Patient was alert and talking with nurse. During treatment vitals were stable. Pt has a history of passing out if blood pressure falls below 800 systolic. Three hours into treatment pt would not arouse when name was called. Vitals were taken and was above 447 systolic. Patient begin swinging arms and legs. Patient not following commands. Treatment was d/c.  Dr notified

## 2016-05-24 NOTE — Progress Notes (Signed)
PT Cancellation Note  Patient Details Name: Shawna [name] MRN: [id number] DOB: 1940/11/19   Cancelled Treatment:    Reason Eval/Treat Not Completed: Patient's level of consciousness (Attempted to see pt x 2 today, however first time (at 1112) she was off the floor at dialysis, and the 2nd time (1604) she was very soundly sleeping and unable to arouse.  Will check back tomorrow. )   Beth Quinlan Vollmer, PT, DPT X: [phone]

## 2016-05-24 NOTE — Progress Notes (Signed)
CRITICAL VALUE ALERT  Critical value received:  Hgb 6.9  Date of notification:  04/23/16  Time of notification:  0723  Critical value read back:Yes.    Nurse who received alert:  Elmyra Ricks, RN  MD notified (1st page):  Dr. Wendee Beavers  Time of first page:  [phone]  MD notified (2nd page):  Time of second page:  Responding MD:    Time MD responded:

## 2016-05-24 NOTE — Progress Notes (Signed)
ANTICOAGULATION CONSULT NOTE - follow up  Pharmacy Consult for Heparin Indication: r/o  DVT  Patient Measurements: Height: 5' 1"  (154.9 cm) Weight: 122 lb 6.4 oz (55.5 kg) IBW/kg (Calculated) : 47.8  HEPARIN DW (KG): 53.7   Vital Signs: Temp: 97.3 F (36.3 C) (11/14 0454) Temp Source: Oral (11/14 0454) BP: 164/69 (11/14 0454) Pulse Rate: 92 (11/14 0454)  Labs:  Recent Labs  05/22/16 0520 05/23/16 0445 05/23/16 0908 05/24/16 0639  HGB 7.9* 7.8*  --  6.9*  HCT 26.0* 25.2*  --  22.2*  PLT 440* 458*  --  402*  HEPARINUNFRC 0.62 0.45  --  0.35  CREATININE  --   --  9.19* 10.89*   Estimated Creatinine Clearance: 3.3 mL/min (by C-G formula based on SCr of 10.89 mg/dL (H)).  Medical History: Past Medical History:  Diagnosis Date  . Anxiety   . Arthritis   . AVM (arteriovenous malformation) of colon   . CAD (coronary artery disease)    a. 12/2011 NSTEMI/Cath/PCI LCX (2.25x14 Resolute DES) & D1 (2.25x22 Resolute DES);  b. 01/2012 Cath/PCI: LM 30, LAD 30p, 40-51m D1 stent ok, 99 in sm branch of diag, LCX patent stent, OM1 20, RCA 95 ost (4.0x12 Promus DES), EF 55%;  c. 04/2012 Lexi Cardiolite  EF 48%, small area of scar @ base/mid inflat wall with mild peri-infarct ischemia.; CABG 12/4  . Carotid artery disease ([place]    a. 663-78%LICA, 95/8850  . Chronic bronchitis ([place]   . Chronic diastolic CHF (congestive heart failure) ([place]    a. 02/2012 Echo EF 60-65%, nl wall motion, Gr 1 DD, mod MR  . Colon cancer ([place] 1992  . Esophageal stricture   . ESRD on hemodialysis ([place]    ESRD due to HTN, started dialysis 2011 [facility]  Access is LUA AVF as of Sept 2014.   .Marland KitchenGERD (gastroesophageal reflux disease)   . High cholesterol 12/2011  . History of blood transfusion 07/2011; 12/2011; 01/2012 X 2; 04/2012  . History of gout   . History of lower GI bleeding   . Hypertension   . Iron deficiency anemia   . Mitral regurgitation    a.  Moderate by echo, 02/2012  . Myocardial infarction   . Ovarian cancer ([place] 1992  . Pneumonia ~ 2009  . PUD (peptic ulcer disease)   . TIA (transient ischemic attack)    Medications:  Scheduled:  . sodium chloride   Intravenous Once  . amiodarone  200 mg Oral Daily  . aspirin EC  81 mg Oral Daily  . chlorhexidine gluconate (MEDLINE KIT)  15 mL Mouth Rinse BID  . cloNIDine  0.1 mg Oral BID  . epoetin (EPOGEN/PROCRIT) injection  14,000 Units Intravenous Q T,Th,Sa-HD  . famotidine (PEPCID) IV  20 mg Intravenous Q24H  . hydrALAZINE  5 mg Intravenous Once  . mouth rinse  15 mL Mouth Rinse QID  . multivitamin  1 tablet Oral Daily  . piperacillin-tazobactam (ZOSYN)  IV  3.375 g Intravenous Q12H  . polyethylene glycol  17 g Per Tube Daily  . vancomycin  500 mg Intravenous Q T,Th,Sa-HD   Assessment: Venous dopplers reporting stable appearance of thrombus in the right basilic vein. IV heparin until DVT ruled out.  Heparin level is therapeutic this am   Hg trending down but no bleeding reported  Goal of Therapy:  Heparin level 0.3-0.7 units/ml Monitor platelets by anticoagulation protocol: Yes  Plan:  Cont heparin drip at 850 units/hr Heparin level and CBC daily Monitor for s/s of bleeding F/u plan need for oral [facility]   Thanks for allowing pharmacy to be a part of this patient's care.  Hart Robinsons, PharmD Clinical Pharmacist Pager:  [phone] 05/24/2016   05/24/2016,10:41 AM

## 2016-05-25 LAB — GLUCOSE, CAPILLARY
GLUCOSE-CAPILLARY: 102 mg/dL — AB (ref 65–99)
GLUCOSE-CAPILLARY: 81 mg/dL (ref 65–99)
GLUCOSE-CAPILLARY: 125 mg/dL — AB (ref 65–99)
Glucose-Capillary: 135 mg/dL — ABNORMAL HIGH (ref 65–99)
Glucose-Capillary: 120 mg/dL — ABNORMAL HIGH (ref 65–99)

## 2016-05-25 LAB — HEPARIN LEVEL (UNFRACTIONATED): HEPARIN UNFRACTIONATED: 0.56 [IU]/mL (ref 0.30–0.70)

## 2016-05-25 MED ORDER — HEPARIN SODIUM (PORCINE) 5000 UNIT/ML IJ SOLN
5000.0000 [IU] | Freq: Three times a day (TID) | INTRAMUSCULAR | Status: DC
  Administered 2016-05-25 – 2016-05-26 (×3): 5000 [IU] via SUBCUTANEOUS
  Filled 2016-05-25 (×3): qty 1

## 2016-05-25 NOTE — Progress Notes (Signed)
PROGRESS NOTE    Shawna [name]  WNU:[id number] DOB: [birth year] DOA: 05/08/2016 PCP: [name], MD    Brief Narrative:  50 yof with a history of CAD and ESRD, presented with acute onset of chest pain unrelieved by aspirin and nitroglycerin. In the emergency department BP 230/130 and she was noted to have new ST depression. She was started on nitroglycerin infusion and IV labetalol and admitted for hypertensive crisis with chest pain.Her chest pain had resolved but she remained hypertensive. On 11/1 patient suddenly became bradycardic and unresponsive. She underwent CPR, chest compressions, received epinephrine and had ROSC. On 11/2 prior to starting her dialysis she was noted to be hypertensive so she was given IV labetalol and developed bradycardia with subsequent PEA arrest. She underwent CPR again for < 5 mins and had ROSC. Weaning has been difficult due to periods of apnea. Pulmonology and Neurology assisting. Patient is currently extubated and has not required bipap during the day. Pulmonology has signed off  Assessment & Plan:   1. Bradycardia with PEA cardiac arrest. On 11/1 & 11/2, patient became bradycardic & went into PEA. She underwent CPR x 2 , received epinephrine, bicarbonate and calcium chloride. She had ROSC after approximately 25 mins of CPR. Lab work had indicated an elevated potassium of 6.4, which may have been cause. She was brieflystarted on a epinephrine infusion which was quicklyweaned off. CXR did not show any acute process. Cardiology was on board.  2. Acute respiratory failure: Was ventilator dependent. Patient intubated while undergoing CPR. Pulmonology was following. Now extubated. Pulmonology has signed off will transition to floor. Speech therapy has recommended dysphagia 1 diet.    3. Hypertensive crisis with chest pain. Chest pain resolved. Cards has been on board. Blood pressures have been labile. Patient has periods where her blood pressures are high and then  periods where they are soft. I do not want to be overly aggressive with her blood pressures as she has seen drops in her blood pressures as well. As such will continue prn hydralazine.    4. Elevated troponin. Patient has a history of CAD status post CABG and stenting. Patient had no evidence of ACS on admission. Since that time, her troponin has started to rise. Cardiology following and felt that this may be demand ischemia/related to CPR. Troponin peak was 1.5 and has since trended down since.    5. ESRD on HD MWF. Nephrology is following for dialysis needs.    6. Chronic diastolic congestive heart failure. Patient has an echocardiogram on 09/2015 with an EF of 03-47%, grade 2 diastolic dysfunction. Continue fluid management with dialysis.   7. PSVT, maintained on amiodarone. EKG remains in sinus rhythm.    8. Myoclonus. Suspect secondary to CKD/ESRD on HD. Resolved, no FN deficits, seen by Neurology .   9. Hyperkalemia. Resolved.    10. Elevated TSH. Recheck as an outpatient.   11. Anemia of chronic kidney disease. Hemoglobin currently stable. Will continue to monitor.   12. Right hand swelling. He is a chronic right basilic vein clot, remove right sided IJ PICC, stop heparin drip, warm compresses and NSAIDs. Swelling has gone down. DW Surgeon Dr Rosana Hoes.    DVT prophylaxis: Heparin infusion Code Status: FULL  Family Communication: husband at bedside Disposition Plan: continue to monitor on floor. Consider narrowing antibiotic regimen to oral antibiotics if continued improvement and lack of fever in the next 24 hours.   Consultants:   Nephrology   Cardiology  Pulmonology  Nutrition  Neurology  Procedures:   Intubation 11/1>>  Anti-infectives    Start     Dose/Rate Route Frequency Ordered Stop   05/19/16 1200  vancomycin (VANCOCIN) 500 mg in sodium chloride 0.9 % 100 mL IVPB     500 mg 100 mL/hr over 60 Minutes Intravenous Every T-Th-Sa (Hemodialysis) 05/17/16  2306     05/18/16 1000  piperacillin-tazobactam (ZOSYN) IVPB 3.375 g     3.375 g 12.5 mL/hr over 240 Minutes Intravenous Every 12 hours 05/17/16 2306     05/17/16 2245  piperacillin-tazobactam (ZOSYN) IVPB 3.375 g     3.375 g 100 mL/hr over 30 Minutes Intravenous  Once 05/17/16 2231 05/18/16 0051   05/17/16 2245  vancomycin (VANCOCIN) IVPB 1000 mg/200 mL premix     1,000 mg 200 mL/hr over 60 Minutes Intravenous  Once 05/17/16 2231 05/18/16 0121      Subjective:  She is in bed, having breakfast, denies any headache chest or abdominal pain. Denies any focal weakness. Says she feels better. Husband bedside.  Objective: Vitals:   05/25/16 0112 05/25/16 0141 05/25/16 0402 05/25/16 0940  BP: (!) 166/54 (!) 167/65 (!) 159/54 (!) 154/62  Pulse: 92 91 87   Resp: 18 16 18    Temp: 98.3 F (36.8 C)  98.7 F (37.1 C)   TempSrc: Oral Oral Axillary   SpO2: 100% 100% 100%   Weight:   55.3 kg (122 lb)   Height:        Intake/Output Summary (Last 24 hours) at 05/25/16 1016 Last data filed at 05/25/16 0402  Gross per 24 hour  Intake            840.4 ml  Output              200 ml  Net            640.4 ml   Filed Weights   05/24/16 0300 05/24/16 1015 05/25/16 0402  Weight: 55.5 kg (122 lb 6.4 oz) 55.5 kg (122 lb 6.4 oz) 55.3 kg (122 lb)    Examination:  General exam: Appears calm and comfortable, NAD, mildly confused Respiratory system: Clear to auscultation. Respiratory effort normal. Cardiovascular system: S1 & S2 heard, RRR. No JVD, murmurs, rubs, gallops or clicks. No pedal edema. Gastrointestinal system: Abdomen is nondistended, soft and nontender. No organomegaly or masses felt. Normal bowel sounds heard. Central nervous system: She answers basic questions, moving all 4 extremities to commands, minimally confused, generally weak but no focal deficits. Extremities: No cyanosis clubbing, minimal edema Skin: No rashes, lesions or ulcers Psychiatry: cannot assess    Data  Reviewed: I have personally reviewed following labs and imaging studies  CBC:  Recent Labs Lab 05/20/16 0420 05/21/16 0510 05/22/16 0520 05/23/16 0445 05/24/16 0639  WBC 18.2* 16.9* 17.4* 15.5* 13.0*  HGB 8.4* 8.0* 7.9* 7.8* 6.9*  HCT 27.6* 26.7* 26.0* 25.2* 22.2*  MCV 100.4* 101.9* 100.4* 99.6 98.7  PLT 368 395 440* 458* 341*   Basic Metabolic Panel:  Recent Labs Lab 05/19/16 0423 05/20/16 0420 05/21/16 0510 05/23/16 0908 05/24/16 0639  NA 137  138 141 142 141 141  K 4.1  4.1 3.5 4.6 3.6 3.6  CL 95*  96* 100* 101 99* 99*  CO2 25  26 26 24 26 24   GLUCOSE 181*  181* 164* 111* 95 110*  BUN 70*  71* 64* 86* 69* 86*  CREATININE 9.94*  10.20* 8.74* 11.03* 9.19* 10.89*  CALCIUM 8.9  9.0 9.4 10.0 9.9 9.9  PHOS  8.0* 5.1* 8.4* 8.2* 8.4*   GFR: Estimated Creatinine Clearance: 3.3 mL/min (by C-G formula based on SCr of 10.89 mg/dL (H)). Liver Function Tests:  Recent Labs Lab 05/19/16 0423 05/20/16 0420 05/21/16 0510 05/23/16 0908 05/24/16 0639  AST 36  --   --   --   --   ALT 178*  --   --   --   --   ALKPHOS 75  --   --   --   --   BILITOT 0.9  --   --   --   --   PROT 6.4*  --   --   --   --   ALBUMIN 2.8*  2.8* 2.9* 2.8* 2.8* 2.7*   No results for input(s): LIPASE, AMYLASE in the last 168 hours. No results for input(s): AMMONIA in the last 168 hours. Coagulation Profile: No results for input(s): INR, PROTIME in the last 168 hours. Cardiac Enzymes:  Recent Labs Lab 05/19/16 1113  CKTOTAL 33*  CKMB 1.5   BNP (last 3 results) No results for input(s): PROBNP in the last 8760 hours. HbA1C: No results for input(s): HGBA1C in the last 72 hours. CBG:  Recent Labs Lab 05/24/16 1616 05/24/16 1939 05/24/16 2355 05/25/16 0353 05/25/16 0749  GLUCAP 81 114* 82 102* 81   Lipid Profile: No results for input(s): CHOL, HDL, LDLCALC, TRIG, CHOLHDL, LDLDIRECT in the last 72 hours. Thyroid Function Tests: No results for input(s): TSH, T4TOTAL, FREET4,  T3FREE, THYROIDAB in the last 72 hours. Anemia Panel: No results for input(s): VITAMINB12, FOLATE, FERRITIN, TIBC, IRON, RETICCTPCT in the last 72 hours. Sepsis Labs: No results for input(s): PROCALCITON, LATICACIDVEN in the last 168 hours.  Recent Results (from the past 240 hour(s))  Culture, blood (routine x 2)     Status: None   Collection Time: 05/17/16 10:49 PM  Result Value Ref Range Status   Specimen Description BLOOD PICC  Final   Special Requests BOTTLES DRAWN AEROBIC AND ANAEROBIC 10CC EACH  Final   Culture NO GROWTH 5 DAYS  Final   Report Status 05/22/2016 FINAL  Final  Culture, blood (routine x 2)     Status: None   Collection Time: 05/17/16 10:51 PM  Result Value Ref Range Status   Specimen Description BLOOD PICC  Final   Special Requests BOTTLES DRAWN AEROBIC AND ANAEROBIC 10CC EACH  Final   Culture NO GROWTH 5 DAYS  Final   Report Status 05/22/2016 FINAL  Final     Radiology Studies: No results found.  Scheduled Meds: . sodium chloride   Intravenous Once  . amiodarone  200 mg Oral Daily  . aspirin EC  81 mg Oral Daily  . chlorhexidine gluconate (MEDLINE KIT)  15 mL Mouth Rinse BID  . cloNIDine  0.1 mg Oral BID  . epoetin (EPOGEN/PROCRIT) injection  10,000 Units Subcutaneous Q T,Th,Sa-HD   And  . epoetin (EPOGEN/PROCRIT) injection  4,000 Units Subcutaneous Q T,Th,Sa-HD  . famotidine (PEPCID) IV  20 mg Intravenous Q24H  . mouth rinse  15 mL Mouth Rinse QID  . multivitamin  1 tablet Oral Daily  . piperacillin-tazobactam (ZOSYN)  IV  3.375 g Intravenous Q12H  . polyethylene glycol  17 g Per Tube Daily  . vancomycin  500 mg Intravenous Q T,Th,Sa-HD   Continuous Infusions: . heparin 850 Units/hr (05/24/16 0640)     LOS: 16 days   Time spent: 25 minutes   Signature  Thurnell Lose M.D on 05/25/2016 at 10:17 AM  Between  7am to 7pm - Pager - [phone], After 7pm go to [email or web address] - password [facility]  [facility]  - Office   [phone]

## 2016-05-25 NOTE — Progress Notes (Signed)
Physical Therapy Treatment Patient Details Name: Shawna [name] MRN: [id number] DOB: 1940/09/10 Today's Date: 05/25/2016    History of Present Illness 50 yof with a history of CAD and ESRD, presented with acute onset of chest pain unrelieved by aspirin and nitroglycerin. In the emergency department BP 230/130 and she was noted to have new ST depression. She was started on nitroglycerin infusion and IV labetalol and admitted for hypertensive crisis with chest pain. Her chest pain had resolved but she remained hypertensive. On 11/1 patient suddenly became bradycardic and unresponsive. She underwent CPR, chest compressions, received epinephrine and had ROSC. On 11/2 prior to starting her dialysis she was noted to be hypertensive so she was given IV labetalol and developed bradycardia with subsequent PEA arrest. She underwent CPR again for < 5 mins and had ROSC. Pt was intubated 05/11/2016 -weaning has been difficult due to periods of apnea, but was successfully extubated on 05/20/2016.  Pulmonology and Neurology assisting. Patient is currently extubated and has not required bipap during the day. Pulmonology has signed off    PT Comments    Pt received in bed, and is agreeable to PT tx.  Pt continues to be confused throughout the tx, and emotionally labile at times.  Continued to progress pt's static sitting balance and posture today, and she was able to increase the amount of time she was able to tolerate sitting on the EOB, and eventually, only required supervision to maintain this balance.  Pt continues to fatigue quickly, and was not willing to attempt sit<>stand today due to expressed fatigue.  Continue to recommend OT consult for ADL's, and SNF upon d/c.   Follow Up Recommendations  SNF     Equipment Recommendations  None recommended by PT    Recommendations for Other Services OT consult     Precautions / Restrictions Precautions Precautions: Fall Precaution Comments: Pt's dtr reports that  she has had 2 falls in the past 6 months - 1 getting up off the toilet, and the other when she was leaning over to look at her husband and see who he was talking to outside.  Restrictions Weight Bearing Restrictions: No    Mobility  Bed Mobility Overal bed mobility: Needs Assistance Bed Mobility: Supine to Sit;Sit to Supine     Supine to sit: Max assist;HOB elevated Sit to supine: Max assist   General bed mobility comments: Pt was able to sit on the EOB for ~12 min today.   Transfers Overall transfer level:  (Pt adamantly expressed that she could not try to stand today. )                  Ambulation/Gait                 Stairs            Wheelchair Mobility    Modified Rankin (Stroke Patients Only)       Balance Overall balance assessment: Needs assistance Sitting-balance support: Bilateral upper extremity supported;Feet supported Sitting balance-Leahy Scale: Poor Sitting balance - Comments: Pt initially demonstrates poor balance awareness, however once PT assisted her to find her center of balance, she was able to maintain static sitting posture with only supervision.   Postural control: Right lateral lean                          Cognition Arousal/Alertness: Awake/alert Behavior During Therapy:  (Emotionally labile - laughing one minute, but then expressing that  irritation the next minute) Overall Cognitive Status: Impaired/Different from baseline Area of Impairment: Orientation Orientation Level: Disoriented to;Situation;Time;Place     Following Commands: Follows one step commands inconsistently Safety/Judgement: Decreased awareness of safety;Decreased awareness of deficits          Exercises      General Comments        Pertinent Vitals/Pain Pain Assessment: 0-10 Pain Location: R UE pain - pt just had an IV placed in that arm.  Pain Descriptors / Indicators: Burning Pain Intervention(s): Limited activity within patient's  tolerance;Repositioned;Monitored during session    Home Living                      Prior Function            PT Goals (current goals can now be found in the care plan section) Acute Rehab PT Goals Patient Stated Goal: pt would like to get stronger so she can go home.  PT Goal Formulation: With patient/family Time For Goal Achievement: 06/06/16 Potential to Achieve Goals: Fair Progress towards PT goals: Progressing toward goals    Frequency    Min 5X/week      PT Plan Current plan remains appropriate    Co-evaluation             End of Session Equipment Utilized During Treatment: Oxygen Activity Tolerance: Patient limited by fatigue Patient left: in bed;with call bell/phone within reach     Time: 1553-1616 PT Time Calculation (min) (ACUTE ONLY): 23 min  Charges:  $Therapeutic Activity: 8-22 mins $Neuromuscular Re-education: 8-22 mins                    G Codes:      Beth Massimo [place], PT, DPT X: [phone]

## 2016-05-25 NOTE — Progress Notes (Signed)
Speech Language Pathology Treatment: Dysphagia  Patient Details Name: Shawna [name] MRN: [id number] DOB: 06/18/1941 Today's Date: 05/25/2016 Time: 3748-2707 SLP Time Calculation (min) (ACUTE ONLY): 23 min  Assessment / Plan / Recommendation Clinical Impression  Pt seen at bedside for ongoing diagnostic dysphagia intervention. No family present at this time. Pt required cues for alertness and faded in and out at times, staring off into space. She had difficulty switching sets (e.g.from cup to straw and straw to spoon), but benefited from verbal cues. She drank ~120 mL thin water without signs/symptoms of aspiration/penetration. Swallow initiation appeared timely. Pt assessed with small bites of graham cracker mixed in puree and pt demonstrated poor attention to bolus (only variably masticating) and required moderate verbal cues to swallow and clear. Will advance liquids to thin and continue D1/puree while in acute setting. Recommend SNF and follow up SLP services for dysphagia (diet tolerance, education, and advancement as appropriate).    HPI HPI: 62 yof with a history of CAD and ESRD, presented with acute onset of chest pain unrelieved by aspirin and nitroglycerin. In the emergency department BP 230/130 and she was noted to have new ST depression. She was started on nitroglycerin infusion and IV labetalol and admitted for hypertensive crisis with chest pain. Her chest pain had resolved but she remained hypertensive. On 11/1 patient suddenly became bradycardic and unresponsive. She underwent CPR, chest compressions, received epinephrine and had ROSC. On 11/2 prior to starting her dialysis she was noted to be hypertensive so she was given IV labetalol and developed bradycardia with subsequent PEA arrest. She underwent CPR again for < 5 mins and had ROSC. Pt was intubated 05/11/2016 -weaning has been difficult due to periods of apnea, but was successfully extubated on 05/20/2016.  Pulmonology and Neurology  assisting. Patient is currently extubated and has not required bipap during the day. Pulmonology has signed off      SLP Plan  Continue with current plan of care     Recommendations  Diet recommendations: Dysphagia 1 (puree);Thin liquid Liquids provided via: Cup;Straw Medication Administration: Crushed with puree Supervision: Staff to assist with self feeding;Full supervision/cueing for compensatory strategies Compensations: Minimize environmental distractions;Slow rate;Small sips/bites;Clear throat intermittently Postural Changes and/or Swallow Maneuvers: Seated upright 90 degrees;Upright 30-60 min after meal                Oral Care Recommendations: Oral care BID;Staff/trained caregiver to provide oral care Follow up Recommendations: Skilled Nursing facility Plan: Continue with current plan of care       Thank you,  Genene Churn, [place]                [place] 05/25/2016, 3:19 PM

## 2016-05-25 NOTE — Clinical Social Work Placement (Signed)
   CLINICAL SOCIAL WORK PLACEMENT  NOTE  Date:  05/25/2016  Patient Details  Name: Shawna [name] MRN: [id number] Date of Birth: 30-Aug-1940  Clinical Social Work is seeking post-discharge placement for this patient at the [place] level of care (*CSW will initial, date and re-position this form in  chart as items are completed):  Yes   Patient/family provided with [place] Work Department's list of facilities offering this level of care within the geographic area requested by the patient (or if unable, by the patient's family).  Yes   Patient/family informed of their freedom to choose among providers that offer the needed level of care, that participate in Medicare, Medicaid or managed care program needed by the patient, have an available bed and are willing to accept the patient.  Yes   Patient/family informed of [facility]'s ownership interest in [facility] and [facility], as well as of the fact that they are under no obligation to receive care at these facilities.  PASRR submitted to EDS on       PASRR number received on       Existing PASRR number confirmed on 05/23/16     FL2 transmitted to all facilities in geographic area requested by pt/family on       FL2 transmitted to all facilities within larger geographic area on       Patient informed that his/her managed care company has contracts with or will negotiate with certain facilities, including the following:        Yes   Patient/family informed of bed offers received.  Patient chooses bed at [facility]     Physician recommends and patient chooses bed at      Patient to be transferred to [facility] on  .  Patient to be transferred to facility by       Patient family notified on   of transfer.  Name of family member notified:        PHYSICIAN       Additional Comment:    _______________________________________________ Ihor Gully,  LCSW 05/25/2016, 10:33 AM

## 2016-05-25 NOTE — Progress Notes (Signed)
Shawna [name]  MRN: [id number]  DOB/AGE: 1941/01/25 75 y.o.  Primary Care Physician:HOWARD, Lennette Bihari, MD  Admit date: 05/08/2016  Chief Complaint:  Chief Complaint  Patient presents with  . Chest Pain    S-Pt presented on  05/08/2016 with  Chief Complaint  Patient presents with  . Chest Pain  .    Pt is lethargic ,does not offer any complaints.     Pt does open eyes ,maintains eye contact and nods her heads but does not talk   Meds . sodium chloride   Intravenous Once  . amiodarone  200 mg Oral Daily  . aspirin EC  81 mg Oral Daily  . chlorhexidine gluconate (MEDLINE KIT)  15 mL Mouth Rinse BID  . cloNIDine  0.1 mg Oral BID  . epoetin (EPOGEN/PROCRIT) injection  10,000 Units Subcutaneous Q T,Th,Sa-HD   And  . epoetin (EPOGEN/PROCRIT) injection  4,000 Units Subcutaneous Q T,Th,Sa-HD  . famotidine (PEPCID) IV  20 mg Intravenous Q24H  . heparin subcutaneous  5,000 Units Subcutaneous Q8H  . mouth rinse  15 mL Mouth Rinse QID  . multivitamin  1 tablet Oral Daily  . polyethylene glycol  17 g Per Tube Daily      Physical Exam: Vital signs in last 24 hours: Temp:  [97.9 F (36.6 C)-98.7 F (37.1 C)] 97.9 F (36.6 C) (11/15 1603) Pulse Rate:  [86-96] 86 (11/15 1603) Resp:  [16-20] 16 (11/15 1603) BP: (100-178)/(54-83) 100/83 (11/15 1603) SpO2:  [100 %] 100 % (11/15 1603) Weight:  [122 lb (55.3 kg)] 122 lb (55.3 kg) (11/15 0402) Weight change: -0 oz (-0 kg) Last BM Date: 05/24/16  Intake/Output from previous day: 11/14 0701 - 11/15 0700 In: 960.4 [P.O.:180; I.V.:176.2; Blood:354.2; IV Piggyback:250] Out: 200  No intake/output data recorded.   Physical Exam: General- pt is awake, opens eyes, follows commands Resp- No acute REsp distress,Bs + B/L. Minimal ronchi CVS- S1S2 regular in rate and rhythm GIT- BS+, soft, NT, ND EXT- NO LE Edema, NO Cyanosis Access -left AVF +  Lab Results: CBC  Recent Labs  05/23/16 0445 05/24/16 0639  WBC 15.5* 13.0*  HGB 7.8*  6.9*  HCT 25.2* 22.2*  PLT 458* 402*    BMET  Recent Labs  05/23/16 0908 05/24/16 0639  NA 141 141  K 3.6 3.6  CL 99* 99*  CO2 26 24  GLUCOSE 95 110*  BUN 69* 86*  CREATININE 9.19* 10.89*  CALCIUM 9.9 9.9    MICRO Recent Results (from the past 240 hour(s))  Culture, blood (routine x 2)     Status: None   Collection Time: 05/17/16 10:49 PM  Result Value Ref Range Status   Specimen Description BLOOD PICC  Final   Special Requests BOTTLES DRAWN AEROBIC AND ANAEROBIC 10CC EACH  Final   Culture NO GROWTH 5 DAYS  Final   Report Status 05/22/2016 FINAL  Final  Culture, blood (routine x 2)     Status: None   Collection Time: 05/17/16 10:51 PM  Result Value Ref Range Status   Specimen Description BLOOD PICC  Final   Special Requests BOTTLES DRAWN AEROBIC AND ANAEROBIC 10CC EACH  Final   Culture NO GROWTH 5 DAYS  Final   Report Status 05/22/2016 FINAL  Final      Lab Results  Component Value Date   PTH 709.7 (H) 05/29/2013   CALCIUM 9.9 05/24/2016   CAION 1.07 (L) 09/08/2015   PHOS 8.4 (H) 05/24/2016       Impression:  1)Renal ESRd on HD On TUes/Thurs/Saturday  schedule Pt was  dialyzed yesterday NO need of HD today   2)HTN BP at goal  Medication- On calcium blockers   3)Anemia in ESRD the goal for HGb is 9--11. HGb not at goal  on epo during HD   4)CKD Mineral-Bone Disorder Secondary Hyperparathyroidism present  On Cinacalcet Phosphorus is not at goal.      5)CVS-Admitted with Bradycardia/S/P Code Blue  Primary MD following  6)Electrolytes Normokalemic Normonatremic   7)Acid base Co2 at goal  8) Resp-Pt was Intubated,now extubated Primary team following      Plan:  NO need of Hd today. Will dialyze in am  Will recheck CBC and decide about transfusion  Javad Salva S 05/25/2016, 8:43 PM

## 2016-05-25 NOTE — Progress Notes (Signed)
Lamar A. Merlene Laughter, MD     [email or web address]          Shawna [name] is an 75 y.o. female.   Assessment/Plan: 1. Global weakness with the difficulty weaning of ventilatory suspected of having a mild case of critical illness myopathy. She has recovered nicely. I do not think that she has underlying neuromuscular disorders. Antibodies for myasthenia gravis are still pending. We'll sign off. The patient should follow-up with [place] in 6 weeks.  2. Myoclonic jerks likely due to metabolic illness and renal failure. No need for treatment.    Patient has done well off the ventilator and is not in any need of BiPAP. Her speech has improved. She has no complaints at this time.   GENERAL: The patient appears to be in moderate discomfort but in no acute distress. She is intubated and was just given sedatives.  HEENT: Supple. Atraumatic normocephalic.   ABDOMEN: soft  EXTREMITIES: No edema   BACK: Normal.  SKIN: Normal by inspection.    MENTAL STATUS: She is awake and alert. She is lucid and coherent. She follows commands well and speaks in fluent sentences without dysarthria.  CRANIAL NERVES: Pupils are equal, round and reactive to light; extra ocular movements are full, there is no significant nystagmus; upper and lower facial muscles are normal in strength and symmetric, there is no flattening of the nasolabial folds.  MOTOR:  Upper extremities are 5/5 bilaterally.  COORDINATION: No tremors or dysmetria is observed.      Objective: Vital signs in last 24 hours: Temp:  [97.9 F (36.6 C)-98.7 F (37.1 C)] 97.9 F (36.6 C) (11/15 1603) Pulse Rate:  [86-96] 86 (11/15 1603) Resp:  [16-20] 16 (11/15 1603) BP: (100-178)/(54-83) 100/83 (11/15 1603) SpO2:  [100 %] 100 % (11/15 1603) Weight:  [122 lb (55.3 kg)] 122 lb (55.3 kg) (11/15 0402)  Intake/Output from previous day: 11/14 0701 - 11/15 0700 In: 960.4 [P.O.:180; I.V.:176.2; Blood:354.2; IV  Piggyback:250] Out: 200  Intake/Output this shift: No intake/output data recorded. Nutritional status: DIET - DYS 1 Room service appropriate? Yes; Fluid consistency: Nectar Thick   Lab Results: Results for orders placed or performed during the hospital encounter of 05/08/16 (from the past 48 hour(s))  Glucose, capillary     Status: None   Collection Time: 05/23/16 10:09 PM  Result Value Ref Range   Glucose-Capillary 96 65 - 99 mg/dL   Comment 1 Notify RN   Glucose, capillary     Status: Abnormal   Collection Time: 05/24/16 12:11 AM  Result Value Ref Range   Glucose-Capillary 102 (H) 65 - 99 mg/dL   Comment 1 Notify RN   Glucose, capillary     Status: Abnormal   Collection Time: 05/24/16  4:34 AM  Result Value Ref Range   Glucose-Capillary 107 (H) 65 - 99 mg/dL   Comment 1 Notify RN   Heparin level (unfractionated)     Status: None   Collection Time: 05/24/16  6:39 AM  Result Value Ref Range   Heparin Unfractionated 0.35 0.30 - 0.70 IU/mL    Comment:        IF HEPARIN RESULTS ARE BELOW EXPECTED VALUES, AND PATIENT DOSAGE HAS BEEN CONFIRMED, SUGGEST FOLLOW UP TESTING OF ANTITHROMBIN III LEVELS.   CBC     Status: Abnormal   Collection Time: 05/24/16  6:39 AM  Result Value Ref Range   WBC 13.0 (H) 4.0 - 10.5 K/uL   RBC 2.25 (L) 3.87 - 5.11 MIL/uL  Hemoglobin 6.9 (LL) 12.0 - 15.0 g/dL    Comment: CRITICAL RESULT CALLED TO, READ BACK BY AND VERIFIED WITH: LIGHTNER,N RN AT 05/24/16 723AM BY HFLYNT    HCT 22.2 (L) 36.0 - 46.0 %   MCV 98.7 78.0 - 100.0 fL   MCH 30.7 26.0 - 34.0 pg   MCHC 31.1 30.0 - 36.0 g/dL   RDW 17.3 (H) 11.5 - 15.5 %   Platelets 402 (H) 150 - 400 K/uL  Renal function panel     Status: Abnormal   Collection Time: 05/24/16  6:39 AM  Result Value Ref Range   Sodium 141 135 - 145 mmol/L   Potassium 3.6 3.5 - 5.1 mmol/L   Chloride 99 (L) 101 - 111 mmol/L   CO2 24 22 - 32 mmol/L   Glucose, Bld 110 (H) 65 - 99 mg/dL   BUN 86 (H) 6 - 20 mg/dL    Creatinine, Ser 10.89 (H) 0.44 - 1.00 mg/dL   Calcium 9.9 8.9 - 10.3 mg/dL   Phosphorus 8.4 (H) 2.5 - 4.6 mg/dL   Albumin 2.7 (L) 3.5 - 5.0 g/dL   GFR calc non Af Amer 3 (L) >60 mL/min   GFR calc Af Amer 3 (L) >60 mL/min    Comment: (NOTE) The eGFR has been calculated using the CKD EPI equation. This calculation has not been validated in all clinical situations. eGFR's persistently <60 mL/min signify possible Chronic Kidney Disease.    Anion gap 18 (H) 5 - 15  Vancomycin, random     Status: None   Collection Time: 05/24/16  6:40 AM  Result Value Ref Range   Vancomycin Rm 25     Comment:        Random Vancomycin therapeutic range is dependent on dosage and time of specimen collection. A peak range is 20.0-40.0 ug/mL A trough range is 5.0-15.0 ug/mL          Glucose, capillary     Status: Abnormal   Collection Time: 05/24/16  8:05 AM  Result Value Ref Range   Glucose-Capillary 104 (H) 65 - 99 mg/dL  Prepare RBC     Status: None   Collection Time: 05/24/16 11:30 AM  Result Value Ref Range   Order Confirmation ORDER PROCESSED BY BLOOD BANK   Type and screen [facility]     Status: None (Preliminary result)   Collection Time: 05/24/16 11:30 AM  Result Value Ref Range   ABO/RH(D) O POS    Antibody Screen POS    Sample Expiration 05/27/2016    DAT, IgG POS    Antibody Identification NO CLINICALLY SIGNIFICANT ANTIBODY IDENTIFIED    Antibody ID,T [place] NO SPECIFIC ANTIBODY DEMONSTRATED IN ELUATE    Unit Number [id number]    Blood Component Type RED CELLS,LR    Unit division 00    Status of Unit ALLOCATED    Donor AG Type NEGATIVE FOR KIDD B ANTIGEN    Transfusion Status OK TO TRANSFUSE    Crossmatch Result COMPATIBLE    Unit Number [id number]    Blood Component Type RBC LR PHER1    Unit division 00    Status of Unit ISSUED    Donor AG Type NEGATIVE FOR KIDD B ANTIGEN    Transfusion Status OK TO TRANSFUSE    Crossmatch Result COMPATIBLE    Unit Number  [id number]    Blood Component Type RED CELLS,LR    Unit division 00    Status of Unit REL FROM [facility]  Transfusion Status DO NOT ISSUE FOR TRANSFUSION    Crossmatch Result INCOMPATIBLE   Glucose, capillary     Status: None   Collection Time: 05/24/16  4:16 PM  Result Value Ref Range   Glucose-Capillary 81 65 - 99 mg/dL   Comment 1 Notify RN    Comment 2 Document in Chart   Glucose, capillary     Status: Abnormal   Collection Time: 05/24/16  7:39 PM  Result Value Ref Range   Glucose-Capillary 114 (H) 65 - 99 mg/dL   Comment 1 Notify RN    Comment 2 Document in Chart   Glucose, capillary     Status: None   Collection Time: 05/24/16 11:55 PM  Result Value Ref Range   Glucose-Capillary 82 65 - 99 mg/dL   Comment 1 Notify RN    Comment 2 Document in Chart   Glucose, capillary     Status: Abnormal   Collection Time: 05/25/16  3:53 AM  Result Value Ref Range   Glucose-Capillary 102 (H) 65 - 99 mg/dL   Comment 1 Notify RN    Comment 2 Document in Chart   Heparin level (unfractionated)     Status: None   Collection Time: 05/25/16  7:00 AM  Result Value Ref Range   Heparin Unfractionated 0.56 0.30 - 0.70 IU/mL    Comment:        IF HEPARIN RESULTS ARE BELOW EXPECTED VALUES, AND PATIENT DOSAGE HAS BEEN CONFIRMED, SUGGEST FOLLOW UP TESTING OF ANTITHROMBIN III LEVELS.   Glucose, capillary     Status: None   Collection Time: 05/25/16  7:49 AM  Result Value Ref Range   Glucose-Capillary 81 65 - 99 mg/dL   Comment 1 Notify RN    Comment 2 Document in Chart   Glucose, capillary     Status: Abnormal   Collection Time: 05/25/16 11:40 AM  Result Value Ref Range   Glucose-Capillary 125 (H) 65 - 99 mg/dL   Comment 1 Notify RN    Comment 2 Document in Chart   Glucose, capillary     Status: Abnormal   Collection Time: 05/25/16  4:38 PM  Result Value Ref Range   Glucose-Capillary 135 (H) 65 - 99 mg/dL   Comment 1 Notify RN    Comment 2 Document in Chart     Lipid Panel No  results for input(s): CHOL, TRIG, HDL, CHOLHDL, VLDL, LDLCALC in the last 72 hours.  Studies/Results:   HEAD CT Brain: There is no evidence of acute cortical infarct, intracranial hemorrhage, mass, midline shift, or extra-axial fluid collection. Mild cerebral atrophy is unchanged. Confluent cerebral white matter hypodensities are similar to the prior study and compatible with extensive chronic small vessel ischemic disease. There are chronic lacunar infarcts in the basal ganglia.  Vascular: Calcified atherosclerosis at the skullbase. No hyperdense vessel.  Skull: No fracture or focal osseous lesion.  Sinuses/Orbits: Visualized paranasal sinuses and mastoid air cells are clear. Visualized orbits are grossly unremarkable.  Other: None.  IMPRESSION: 1. No evidence of acute intracranial abnormality. 2. Extensive chronic small vessel ischemic disease.      Medications:  Scheduled Meds: . sodium chloride   Intravenous Once  . amiodarone  200 mg Oral Daily  . aspirin EC  81 mg Oral Daily  . chlorhexidine gluconate (MEDLINE KIT)  15 mL Mouth Rinse BID  . cloNIDine  0.1 mg Oral BID  . epoetin (EPOGEN/PROCRIT) injection  10,000 Units Subcutaneous Q T,Th,Sa-HD   And  . epoetin (EPOGEN/PROCRIT) injection  4,000 Units Subcutaneous Q T,Th,Sa-HD  . famotidine (PEPCID) IV  20 mg Intravenous Q24H  . heparin subcutaneous  5,000 Units Subcutaneous Q8H  . mouth rinse  15 mL Mouth Rinse QID  . multivitamin  1 tablet Oral Daily  . polyethylene glycol  17 g Per Tube Daily   Continuous Infusions:  PRN Meds:.sodium chloride, acetaminophen, alteplase, fentaNYL (SUBLIMAZE) injection, fentaNYL (SUBLIMAZE) injection, fluticasone, heparin, heparin, hydrALAZINE, ipratropium-albuterol, lidocaine (PF), lidocaine-prilocaine, midazolam, ondansetron (ZOFRAN) IV, pentafluoroprop-tetrafluoroeth     LOS: 16 days   Azilee Pirro A. Merlene Laughter, M.D.  Diplomate, Tax adviser of Psychiatry and Neurology (  Neurology).

## 2016-05-25 NOTE — Progress Notes (Signed)
ANTICOAGULATION CONSULT NOTE - follow up  Pharmacy Consult for Heparin Indication: r/o  DVT  Patient Measurements: Height: _0  (154.9 cm) Weight: 122 lb (55.3 kg) IBW/kg (Calculated) : 47.8  HEPARIN DW (KG): 53.7   Vital Signs: Temp: 98.7 F (37.1 C) (11/15 0402) Temp Source: Axillary (11/15 0402) BP: 159/54 (11/15 0402) Pulse Rate: 87 (11/15 0402)  Labs:  Recent Labs  05/23/16 0445 05/23/16 0908 05/24/16 0639 05/25/16 0700  HGB 7.8*  --  6.9*  --   HCT 25.2*  --  22.2*  --   PLT 458*  --  402*  --   HEPARINUNFRC 0.45  --  0.35 0.56  CREATININE  --  9.19* 10.89*  --    Estimated Creatinine Clearance: 3.3 mL/min (by C-G formula based on SCr of 10.89 mg/dL (H)).  Medical History: Past Medical History:  Diagnosis Date  . Anxiety   . Arthritis   . AVM (arteriovenous malformation) of colon   . CAD (coronary artery disease)    a. 12/2011 NSTEMI/Cath/PCI LCX (2.25x14 Resolute DES) & D1 (2.25x22 Resolute DES);  b. 01/2012 Cath/PCI: LM 30, LAD 30p, 40-47m D1 stent ok, 99 in sm branch of diag, LCX patent stent, OM1 20, RCA 95 ost (4.0x12 Promus DES), EF 55%;  c. 04/2012 Lexi Cardiolite  EF 48%, small area of scar @ base/mid inflat wall with mild peri-infarct ischemia.; CABG 12/4  . Carotid artery disease ([place]    a. 601-75%LICA, 91/0258  . [place]   . Chronic diastolic CH[place] (congestive heart failure) ([place]    a. 02/2012 Echo EF 60-65%, nl wall motion, Gr 1 DD, mod MR  . [place]  . Esophageal stricture   . [place] [facility] [place]    ESRD due to HTN, [facility] 2011 [facility]  Access is LUA AVF as of Sept 2014.   .Marland KitchenGERD (gastroesophageal reflux disease)   . High cholesterol 12/2011  . History of blood transfusion 07/2011; 12/2011; 01/2012 X 2; 04/2012  . History of gout   . History of lower GI bleeding   . Hypertension   . Iron deficiency anemia   . Mitral regurgitation    a.  Moderate by echo, 02/2012  . Myocardial infarction   . [place]ian cancer [place]  . Pneumonia ~ 2009  . PUD (peptic ulcer disease)   . TIA (transient ischemic attack)    Medications:  Scheduled:  . sodium chloride   Intravenous Once  . amiodarone  200 mg Oral Daily  . aspirin EC  81 mg Oral Daily  . chlorhexidine gluconate (MEDLINE KIT)  15 mL Mouth Rinse BID  . cloNIDine  0.1 mg Oral BID  . epoetin (EPOGEN/PROCRIT) injection  10,000 Units Subcutaneous Q T,Th,Sa-HD   And  . epoetin (EPOGEN/PROCRIT) injection  4,000 Units Subcutaneous Q T,Th,Sa-HD  . famotidine (PEPCID) IV  20 mg Intravenous Q24H  . hydrALAZINE  5 mg Intravenous Once  . mouth rinse  15 mL Mouth Rinse QID  . multivitamin  1 tablet Oral Daily  . piperacillin-tazobactam (ZOSYN)  IV  3.375 g Intravenous Q12H  . polyethylene glycol  17 g Per Tube Daily  . vancomycin  500 mg Intravenous Q T,Th,Sa-HD   Assessment: Venous dopplers reporting stable appearance of thrombus in the right basilic vein. Heparin level is therapeutic this am.  No bleeding reported, monitor CBC  Goal of Therapy:  Heparin level  0.3-0.7 units/ml Monitor platelets by anticoagulation protocol: Yes   Plan:  Cont heparin drip at 850 units/hr Heparin level and CBC daily Monitor for s/s of bleeding  Thanks for allowing pharmacy to be a part of this patient's care.  Hart Robinsons, PharmD Clinical Pharmacist Pager:  [phone] 05/25/2016   05/25/2016,8:24 AM

## 2016-05-26 DIAGNOSIS — R531 Weakness: Secondary | ICD-10-CM | POA: Diagnosis not present

## 2016-05-26 DIAGNOSIS — Z79899 Other long term (current) drug therapy: Secondary | ICD-10-CM | POA: Diagnosis not present

## 2016-05-26 DIAGNOSIS — R05 Cough: Secondary | ICD-10-CM | POA: Diagnosis not present

## 2016-05-26 DIAGNOSIS — R4182 Altered mental status, unspecified: Secondary | ICD-10-CM | POA: Diagnosis not present

## 2016-05-26 DIAGNOSIS — Z7401 Bed confinement status: Secondary | ICD-10-CM | POA: Diagnosis not present

## 2016-05-26 DIAGNOSIS — I169 Hypertensive crisis, unspecified: Secondary | ICD-10-CM | POA: Diagnosis not present

## 2016-05-26 DIAGNOSIS — E785 Hyperlipidemia, unspecified: Secondary | ICD-10-CM | POA: Diagnosis not present

## 2016-05-26 DIAGNOSIS — R404 Transient alteration of awareness: Secondary | ICD-10-CM | POA: Diagnosis not present

## 2016-05-26 DIAGNOSIS — R1311 Dysphagia, oral phase: Secondary | ICD-10-CM | POA: Diagnosis not present

## 2016-05-26 DIAGNOSIS — J9690 Respiratory failure, unspecified, unspecified whether with hypoxia or hypercapnia: Secondary | ICD-10-CM | POA: Diagnosis not present

## 2016-05-26 DIAGNOSIS — Z992 Dependence on renal dialysis: Secondary | ICD-10-CM | POA: Diagnosis not present

## 2016-05-26 DIAGNOSIS — I5032 Chronic diastolic (congestive) heart failure: Secondary | ICD-10-CM | POA: Diagnosis not present

## 2016-05-26 DIAGNOSIS — R279 Unspecified lack of coordination: Secondary | ICD-10-CM | POA: Diagnosis not present

## 2016-05-26 DIAGNOSIS — J9811 Atelectasis: Secondary | ICD-10-CM | POA: Diagnosis not present

## 2016-05-26 DIAGNOSIS — D649 Anemia, unspecified: Secondary | ICD-10-CM | POA: Diagnosis not present

## 2016-05-26 DIAGNOSIS — N185 Chronic kidney disease, stage 5: Secondary | ICD-10-CM | POA: Diagnosis not present

## 2016-05-26 DIAGNOSIS — N186 End stage renal disease: Secondary | ICD-10-CM | POA: Diagnosis not present

## 2016-05-26 DIAGNOSIS — D631 Anemia in chronic kidney disease: Secondary | ICD-10-CM | POA: Diagnosis not present

## 2016-05-26 DIAGNOSIS — R41841 Cognitive communication deficit: Secondary | ICD-10-CM | POA: Diagnosis not present

## 2016-05-26 DIAGNOSIS — R0602 Shortness of breath: Secondary | ICD-10-CM | POA: Diagnosis not present

## 2016-05-26 DIAGNOSIS — I10 Essential (primary) hypertension: Secondary | ICD-10-CM | POA: Diagnosis not present

## 2016-05-26 DIAGNOSIS — I120 Hypertensive chronic kidney disease with stage 5 chronic kidney disease or end stage renal disease: Secondary | ICD-10-CM | POA: Diagnosis not present

## 2016-05-26 DIAGNOSIS — M6281 Muscle weakness (generalized): Secondary | ICD-10-CM | POA: Diagnosis not present

## 2016-05-26 LAB — CBC
HEMATOCRIT: 27.8 % — AB (ref 36.0–46.0)
Hemoglobin: 8.9 g/dL — ABNORMAL LOW (ref 12.0–15.0)
MCH: 30.8 pg (ref 26.0–34.0)
MCHC: 32 g/dL (ref 30.0–36.0)
MCV: 96.2 fL (ref 78.0–100.0)
PLATELETS: 340 10*3/uL (ref 150–400)
RBC: 2.89 MIL/uL — ABNORMAL LOW (ref 3.87–5.11)
RDW: 17.8 % — AB (ref 11.5–15.5)
WBC: 8.7 10*3/uL (ref 4.0–10.5)

## 2016-05-26 LAB — GLUCOSE, CAPILLARY
GLUCOSE-CAPILLARY: 92 mg/dL (ref 65–99)
GLUCOSE-CAPILLARY: 130 mg/dL — AB (ref 65–99)
Glucose-Capillary: 116 mg/dL — ABNORMAL HIGH (ref 65–99)
Glucose-Capillary: 95 mg/dL (ref 65–99)

## 2016-05-26 MED ORDER — HYDROCORTISONE 1 % EX CREA
TOPICAL_CREAM | Freq: Three times a day (TID) | CUTANEOUS | Status: DC
Start: 2016-05-26 — End: 2016-05-26
  Filled 2016-05-26: qty 1.5

## 2016-05-26 MED ORDER — FAMOTIDINE 20 MG PO TABS: 20.0000 mg | ORAL_TABLET | Freq: Every day | ORAL | Status: DC

## 2016-05-26 NOTE — Discharge Summary (Addendum)
Shawna [name] YME:[id number] DOB: 1940/12/28 DOA: 05/08/2016  PCP: [name], MD  Admit date: 05/08/2016  Discharge date: 05/26/2016  Admitted From: Home  Disposition:  SNF   Recommendations for Outpatient Follow-up:   Follow up with PCP in 1-2 weeks  PCP Please obtain BMP/CBC, 2 view CXR in 1week,  (see Discharge instructions)   PCP Please follow up on the following pending results: none   Home Health: none Equipment/Devices: non3  Consultations: Neuro, Cards, Pulm, Renal, Palliative Discharge Condition: Fair  CODE STATUS: Full   Diet Recommendation: DIET - DYS 1 Room service appropriate? Yes; Fluid consistency: Nectar Thick     Chief Complaint  Patient presents with  . Chest Pain     Brief history of present illness from the day of admission and additional interim summary     75 yof with a history of CAD and ESRD, presented with acute onset of chest pain unrelieved by aspirin and nitroglycerin. In the emergency department BP 230/130 and she was noted to have new ST depression. She was started on nitroglycerin infusion and IV labetalol and admitted for hypertensive crisis with chest pain.Her chest pain had resolved but she remained hypertensive. On 11/1 patient suddenly became bradycardic and unresponsive. She underwent CPR, chest compressions, received epinephrine and had ROSC. On 11/2 prior to starting her dialysis she was noted to be hypertensive so she was given IV labetalol and developed bradycardia with subsequent PEA arrest. She underwent CPR again for < 5 mins and had ROSC.   She was also seen by pulmonary along with neurology, palliative care and cardiology, nephrology conducted dialysis treatments, she was eventually extubated a few days ago and for now Pulmonology has signed off, patient's  mental status has improved she still pleasantly confused but without any focal deficits. She does have generalized weakness for which she will require SNF.    Hospital issues addressed     1. Bradycardia with PEA cardiac arrest. On 11/1 & 11/2, patient became bradycardic & went into PEA. She underwent CPR x 2 , received epinephrine, bicarbonate and calcium chloride. She had ROSC after approximately 25 mins of CPR. Lab work had indicated an elevated potassium of 6.4, which may have been cause. She was brieflystarted on a epinephrine infusion which was quicklyweaned off. CXR didnot show any acute process. Cardiology was on board.  2. Acute Hypoxic respiratory failure: Was ventilator dependent. Patient intubated while undergoing CPR. Pulmonology was following. Now extubated. Pulmonology has signed off, remains stable > 3 days, Speech therapy has recommended dysphagia 1 diet.    3. Hypertensive crisis with chest pain. Chest pain resolved. Cards has been on board. Blood pressures have been labile. Patient has periods where her blood pressures are high and then periods where they are soft. I do not want to be overly aggressive with her blood pressures as she has seen drops in her blood pressures as well. Continue present regimen of Catapres, may use hydralazine 50 mg every 8 hours as needed for systolic blood pressure is 150.  4. Elevated troponin. Patient has a history of CAD status post CABG and stenting. Patient had no evidence of ACS on admission. Since that time, her troponin has started to rise. Cardiology following and felt that this may be demand ischemia/related to CPR. Troponin peak was 1.5 and has since trended down since.    5. ESRD on HD MWF. Nephrology is following for dialysis needs.    6. Chronic diastolic congestive heart failure. Patient has an echocardiogram on 09/2015 with an EF of 12-45%, grade 2 diastolic dysfunction. Continue fluid management with  dialysis.   7. PSVT, maintained on amiodarone. EKG remains in sinus rhythm.    8. Myoclonus. Suspect secondary to CKD/ESRD on HD. Resolved, no FN deficits, seen by Neurology .   9. Hyperkalemia. Resolved with dialysis.    10. Elevated TSH. Recheck TSH, free T4 and T3 in 7-10 days in the outpatient setting.   11. Anemia of chronic kidney disease. Hemoglobin currently stable. Will continue to monitor.   12. Right hand swelling. He is a chronic right basilic vein clot, remove right sided IJ PICC, avoid IV sticks in the right arm, warm compresses and NSAIDs. Swelling has gone down. DW Surgeon Dr Rosana Hoes.   13. Encephalopathy - due to Cardiac arrest, hospital induced delirium, patient's mental status has improved she still pleasantly confused but without any focal deficits.   Discharge diagnosis     Principal Problem:   Hypertensive crisis Active Problems:   Coronary atherosclerosis of native coronary artery   ESRD on hemodialysis (HCC)   Anemia of chronic kidney failure   Chronic diastolic CHF (congestive heart failure) (HCC)   Chest pain   Acute respiratory failure (HCC)   Hypertensive crisis without congestive heart failure   Cardiac arrest [facility])   Palliative care encounter   DNR (do not resuscitate) discussion   Goals of care, counseling/discussion    Discharge instructions    Discharge Instructions    Discharge instructions    Complete by:  As directed    Follow with Primary MD [name], MD in 7 days   Get CBC, CMP, 2 view Chest X ray checked  by Primary MD or SNF MD in 5-7 days ( we routinely change or add medications that can affect your baseline labs and fluid status, therefore we recommend that you get the mentioned basic workup next visit with your PCP, your PCP may decide not to get them or add new tests based on their clinical decision)   Activity: As tolerated with Full fall precautions use walker/cane & assistance as  needed   Disposition SNF   Diet:   DIET - DYS 1 Room service appropriate? Yes; Fluid consistency: Nectar Thick with feeding assistance and aspiration precautions.  For Heart failure patients - Check your Weight same time everyday, if you gain over 2 pounds, or you develop in leg swelling, experience more shortness of breath or chest pain, call your Primary MD immediately. Follow Cardiac Low Salt Diet and 1.5 lit/day fluid restriction.   On your next visit with your primary care physician please Get Medicines reviewed and adjusted.   Please request your Prim.MD to go over all Hospital Tests and Procedure/Radiological results at the follow up, please get all Hospital records sent to your Prim MD by signing hospital release before you go home.   If you experience worsening of your admission symptoms, develop shortness of breath, life threatening emergency, suicidal or homicidal thoughts you must seek medical attention immediately by calling 911 or  calling your MD immediately  if symptoms less severe.  You Must read complete instructions/literature along with all the possible adverse reactions/side effects for all the Medicines you take and that have been prescribed to you. Take any new Medicines after you have completely understood and accpet all the possible adverse reactions/side effects.   Do not drive, operate heavy machinery, perform activities at heights, swimming or participation in water activities or provide baby sitting services if your were admitted for syncope or siezures until you have seen by Primary MD or a Neurologist and advised to do so again.  Do not drive when taking Pain medications.    Do not take more than prescribed Pain, Sleep and Anxiety Medications  Special Instructions: If you have smoked or chewed Tobacco  in the last 2 yrs please stop smoking, stop any regular Alcohol  and or any Recreational drug use.  Wear Seat belts while driving.   Please note  You  were cared for by a hospitalist during your hospital stay. If you have any questions about your discharge medications or the care you received while you were in the hospital after you are discharged, you can call the unit and asked to speak with the hospitalist on call if the hospitalist that took care of you is not available. Once you are discharged, your primary care physician will handle any further medical issues. Please note that NO REFILLS for any discharge medications will be authorized once you are discharged, as it is imperative that you return to your primary care physician (or establish a relationship with a primary care physician if you do not have one) for your aftercare needs so that they can reassess your need for medications and monitor your lab values.   Increase activity slowly    Complete by:  As directed       Discharge Medications     Medication List    STOP taking these medications   labetalol 200 MG tablet Commonly known as:  NORMODYNE     TAKE these medications   ALPRAZolam 0.25 MG tablet Commonly known as:  XANAX Take 1-2 tablets by mouth at bedtime as needed for sleep.   amiodarone 200 MG tablet Commonly known as:  PACERONE Take 1 tablet (200 mg total) by mouth daily.   aspirin EC 81 MG tablet Take 81 mg by mouth daily.   cloNIDine 0.1 MG tablet Commonly known as:  CATAPRES Take 1 tablet (0.1 mg total) by mouth 2 (two) times daily.   epoetin alfa 10000 UNIT/ML injection Commonly known as:  EPOGEN,PROCRIT Inject 1 mL (10,000 Units total) into the vein every Monday, Wednesday, and Friday with hemodialysis.   fluticasone 50 MCG/ACT nasal spray Commonly known as:  FLONASE Place 2 sprays into the nose daily as needed for allergies.   lanthanum 1000 MG chewable tablet Commonly known as:  FOSRENOL Chew 1 tablet (1,000 mg total) by mouth 3 (three) times daily with meals.   multivitamin Tabs tablet Take 1 tablet by mouth daily.   nitroGLYCERIN 0.4 MG SL  tablet Commonly known as:  NITROSTAT Place 1 tablet (0.4 mg total) under the tongue every 5 (five) minutes as needed for chest pain.   omeprazole 20 MG capsule Commonly known as:  PRILOSEC TAKE ONE CAPSULE BY MOUTH ONCE DAILY   PROAIR HFA 108 (90 Base) MCG/ACT inhaler Generic drug:  albuterol Inhale 1 puff into the lungs every 6 (six) hours as needed for wheezing or shortness of breath.   SENSIPAR  30 MG tablet Generic drug:  cinacalcet Take 30 mg by mouth daily with supper.   simvastatin 20 MG tablet Commonly known as:  ZOCOR Take 1 tablet (20 mg total) by mouth at bedtime.        Contact information for follow-up providers    [name], MD. Schedule an appointment as soon as possible for a visit in 1 week(s).   Specialty:  Family Medicine Contact information: [place] [address] [phone]            Contact information for after-discharge care    [facility] Follow up.   Specialty:  [place] information: [address] [place] [phone]                  Major procedures and Radiology Reports - PLEASE review detailed and final reports thoroughly  -        Ct Head Wo Contrast  Result Date: 05/19/2016 CLINICAL DATA:  Altered mental status. Hypertensive crisis. Cardiac arrest. Unable to wean from ventilator. EXAM: CT HEAD WITHOUT CONTRAST TECHNIQUE: Contiguous axial images were obtained from the base of the skull through the vertex without intravenous contrast. COMPARISON:  03/09/2016 FINDINGS: Motion artifact at the skullbase. Brain: There is no evidence of acute cortical infarct, intracranial hemorrhage, mass, midline shift, or extra-axial fluid collection. Mild cerebral atrophy is unchanged. Confluent cerebral white matter hypodensities are similar to the prior study and compatible with extensive chronic small vessel ischemic disease. There are chronic  lacunar infarcts in the basal ganglia. Vascular: Calcified atherosclerosis at the skullbase. No hyperdense vessel. Skull: No fracture or focal osseous lesion. Sinuses/Orbits: Visualized paranasal sinuses and mastoid air cells are clear. Visualized orbits are grossly unremarkable. Other: None. IMPRESSION: 1. No evidence of acute intracranial abnormality. 2. Extensive chronic small vessel ischemic disease. Electronically Signed   By: Logan Bores M.D.   On: 05/19/2016 10:53   Dg Hand 2 View Right  Result Date: 05/18/2016 CLINICAL DATA:  Right hand swelling EXAM: RIGHT HAND - 2 VIEW COMPARISON:  None. FINDINGS: There is osteoarthritis at the base of the thumb metacarpal with subchondral sclerosis and cystic change at the base of the first proximal phalanx. Lesser degree of osteoarthritis involving the DIP joints of the second through fifth digits more so involving the index finger. Slight joint space narrowing of the third and fifth MCP. No chondrocalcinosis. There is diffuse soft tissue swelling about the hand more so dorsally. No frank bone destruction is seen. Carpal bones are grossly intact. IMPRESSION: Nonspecific soft tissue swelling of the visualized hand more so dorsally. Osteoarthritis at the base of the thumb metacarpal, DIP joints of the second through fifth digits and with slight joint space narrowing of the third and fifth MCP. Electronically Signed   By: Ashley Royalty M.D.   On: 05/18/2016 03:03   US Venous Img Upper Uni Right  Result Date: 05/18/2016 CLINICAL DATA:  Right hand swelling, redness and pain. Recent imaging demonstrating superficial thrombophlebitis within the right basilic vein. EXAM: RIGHT UPPER EXTREMITY VENOUS DOPPLER ULTRASOUND TECHNIQUE: Gray-scale sonography with graded compression, as well as color Doppler and duplex ultrasound were performed to evaluate the upper extremity deep venous system from the level of the subclavian vein and including the jugular, axillary, basilic,  radial, ulnar and upper cephalic vein. Spectral Doppler was utilized to evaluate flow at rest and with distal augmentation maneuvers. COMPARISON:  05/13/2016 FINDINGS: Contralateral  Subclavian Vein: Respiratory phasicity is normal and symmetric with the symptomatic side. No evidence of thrombus. Normal compressibility. Internal Jugular Vein: No evidence of thrombus. Normal compressibility, respiratory phasicity and response to augmentation. Subclavian Vein: No evidence of thrombus. Normal compressibility, respiratory phasicity and response to augmentation. Axillary Vein: No evidence of thrombus. Normal compressibility, respiratory phasicity and response to augmentation. Cephalic Vein: No evidence of thrombus. Normal compressibility, respiratory phasicity and response to augmentation. Basilic Vein: Stable evidence of echogenic thrombus in a nondistended basilic vein. Overall appearance is more suggestive of chronic thrombus and may relate to prior catheter placement such as a PICC line. Given history of end-stage renal disease, this could also relate to chronic occlusion post prior surgical dialysis access. Brachial Veins: No evidence of thrombus. Normal compressibility, respiratory phasicity and response to augmentation. Radial Veins: No evidence of thrombus. Normal compressibility, respiratory phasicity and response to augmentation. Ulnar Veins: No evidence of thrombus. Normal compressibility, respiratory phasicity and response to augmentation. Venous Reflux:  None visualized. Other Findings:  None visualized. IMPRESSION: Stable appearance of thrombus in the right basilic vein. Based on sonographic appearance, this is favored to more likely represent chronic thrombus rather than acute thrombus. This could relate to prior catheter placement or dialysis access. Electronically Signed   By: Aletta Edouard M.D.   On: 05/18/2016 16:35   US Venous Img Upper Uni Right  Result Date: 05/13/2016 CLINICAL DATA:  Recent  placement of a tunneled catheter. Right upper extremity swelling. EXAM: RIGHT UPPER EXTREMITY VENOUS DOPPLER ULTRASOUND TECHNIQUE: Gray-scale sonography with graded compression, as well as color Doppler and duplex ultrasound were performed to evaluate the upper extremity deep venous system from the level of the subclavian vein and including the jugular, axillary, basilic and upper cephalic vein. Spectral Doppler was utilized to evaluate flow at rest and with distal augmentation maneuvers. COMPARISON:  None. FINDINGS: Normal color Doppler flow and phasicity in the left subclavian vein. There appears to an echogenic structure in the right internal jugular vein compatible with the tunneled right jugular central venous catheter. The right internal jugular vein does appear to be patent with phasicity. There is compressibility, color Doppler flow and phasicity in the right subclavian vein. Normal compressibility, color Doppler flow and augmentation in the right axillary vein. Normal compressibility, color Doppler flow and augmentation in the right brachial veins. Radial veins appear to be patent. Ulnar veins are poorly visualized. Right cephalic vein demonstrates normal compressibility and color Doppler flow. The right basilic vein is decompressed and there are linear echoes extending along the brachial vein which could represent an intravenous catheter or related to a prior PICC line placement. There is no flow within the right basilic vein. Other findings:  None visualized. IMPRESSION: No evidence for deep venous thrombosis in the right upper extremity. Thrombosis of the right basilic vein which is a superficial vein. In addition, there is evidence for either a prior catheter in this thrombosed vein or there is currently a catheter in this right basilic vein. Recommend clinical correlation. Electronically Signed   By: Markus Daft M.D.   On: 05/13/2016 15:30   Dg Chest Port 1 View  Result Date: 05/18/2016 CLINICAL  DATA:  75 year old female currently intubated with respiratory failure EXAM: PORTABLE CHEST 1 VIEW COMPARISON:  Prior chest x-ray 05/17/2016 FINDINGS: The tip the endotracheal tube is 4 cm above the carina. A right IJ tunneled PICC is present. The tip of the catheter overlies the distal SVC. A gastric tube is also present. The tip  lies below the diaphragm and field-of-view, presumably within the stomach. Patient is status post median sternotomy with evidence of prior multivessel CABG. The cardiac silhouette and mediastinal contours remain unchanged. Atherosclerotic calcifications again noted in the transverse aorta. No significant pulmonary edema, pleural effusion, pneumothorax or focal airspace consolidation. There is likely mild bibasilar subsegmental atelectasis. No acute osseous abnormality. IMPRESSION: 1. Stable and satisfactory support apparatus. 2. No significant interval change in the appearance of the chest. Persistent mild bibasilar atelectasis. Electronically Signed   By: Jacqulynn Cadet M.D.   On: 05/18/2016 09:05   Dg Chest Port 1 View  Result Date: 05/17/2016 CLINICAL DATA:  Hypoxia EXAM: PORTABLE CHEST 1 VIEW COMPARISON:  May 13, 2016 FINDINGS: Endotracheal tube tip is 5.6 cm above the carina. Nasogastric tube tip and side port are below the diaphragm. Central catheter tip is in the superior vena cava. No pneumothorax. There is no edema or consolidation. The heart size and pulmonary vascularity are normal. No adenopathy. Patient is status post coronary artery bypass grafting. IMPRESSION: Tube and catheter positions as described without pneumothorax. No edema or consolidation. Stable cardiac silhouette. Electronically Signed   By: Lowella Grip III M.D.   On: 05/17/2016 09:43   Dg Chest Port 1 View  Result Date: 05/13/2016 CLINICAL DATA:  Respiratory failure. EXAM: PORTABLE CHEST 1 VIEW COMPARISON:  05/12/2016 . FINDINGS: Endotracheal tube, feeding tube, right IJ line in stable  position. Prior CABG. Heart size normal. Low lung volumes with mild basilar atelectasis. No prominent pleural effusion or pneumothorax . IMPRESSION: 1. Lines and tubes in stable position. 2. Prior CABG.  Heart size normal. 3. Low lung volumes with mild basilar atelectasis. Electronically Signed   By: Marcello Moores  Register   On: 05/13/2016 07:33   Portable Chest Xray  Result Date: 05/12/2016 CLINICAL DATA:  Intubation. EXAM: PORTABLE CHEST 1 VIEW COMPARISON:  05/11/2016. FINDINGS: Endotracheal tube, NG tube, right IJ line in stable position. Prior CABG. Normal pulmonary vascularity. Low lung volumes. Small bilateral pleural effusions again cannot be excluded. IMPRESSION: 1. Lines and tubes in stable position. 2. Prior CABG.  Heart size stable. 3. Low lung volumes with bibasilar atelectasis again noted. Small bilateral pleural effusions again cannot be excluded. Chest is unchanged from prior exam. Electronically Signed   By: Marcello Moores  Register   On: 05/12/2016 07:38   Dg Chest Port 1 View  Result Date: 05/11/2016 CLINICAL DATA:  PICC line placement EXAM: PORTABLE CHEST 1 VIEW COMPARISON:  05/11/2016 FINDINGS: Internal jugular PICC line has been placed. The tip is in the SVC. No pneumothorax. Prior CABG. Heart is borderline in size. There is bibasilar atelectasis. Suspect small layering effusions. Endotracheal tube is 3.4 cm above the carina. NG tube enters the stomach. IMPRESSION: Right internal jugular PICC line tip is in the SVC. No pneumothorax. Bibasilar atelectasis.  Suspect layering effusions. Electronically Signed   By: Rolm Baptise M.D.   On: 05/11/2016 15:16   Dg Chest Port 1 View  Result Date: 05/11/2016 CLINICAL DATA:  Intubated, post resuscitation EXAM: PORTABLE CHEST 1 VIEW COMPARISON:  05/08/2016 FINDINGS: Borderline cardiomegaly. Status post CABG. Endotracheal tube at the level of carina. Endotracheal tube should be retracted about 1 cm. No pneumothorax. Mild basilar atelectasis. No pulmonary  edema. IMPRESSION: Endotracheal tube at the level of carina. Endotracheal tube should be retracted about 1 cm. No pneumothorax. No pulmonary edema. Electronically Signed   By: Lahoma Crocker M.D.   On: 05/11/2016 08:32   Dg Chest Port 1 View  Result Date:  05/09/2016 CLINICAL DATA:  Chest pain radiating into the back, onset tonight during bowel movement. Minimal relief with nitroglycerin. EXAM: PORTABLE CHEST 1 VIEW COMPARISON:  04/06/2016 FINDINGS: Minimal linear scarring or atelectasis in the right base. No confluent airspace consolidation. No large effusion. No pneumothorax. Mild respiratory motion degradation of the image. Unchanged mild cardiomegaly. IMPRESSION: Unchanged cardiomegaly.  No consolidation or large effusion. Electronically Signed   By: Andreas Newport M.D.   On: 05/09/2016 00:07    Micro Results     Recent Results (from the past 240 hour(s))  Culture, blood (routine x 2)     Status: None   Collection Time: 05/17/16 10:49 PM  Result Value Ref Range Status   Specimen Description BLOOD PICC  Final   Special Requests BOTTLES DRAWN AEROBIC AND ANAEROBIC 10CC EACH  Final   Culture NO GROWTH 5 DAYS  Final   Report Status 05/22/2016 FINAL  Final  Culture, blood (routine x 2)     Status: None   Collection Time: 05/17/16 10:51 PM  Result Value Ref Range Status   Specimen Description BLOOD PICC  Final   Special Requests BOTTLES DRAWN AEROBIC AND ANAEROBIC 10CC EACH  Final   Culture NO GROWTH 5 DAYS  Final   Report Status 05/22/2016 FINAL  Final    Today   Subjective    Shawna [name] today has no headache,no chest abdominal pain,no new weakness tingling or numbness, feels much better    Objective   Blood pressure (!) 141/67, pulse 79, temperature 98.3 F (36.8 C), temperature source Oral, resp. rate 20, height 5\' 1"  (1.549 m), weight 55.3 kg (122 lb), SpO2 100 %.   Intake/Output Summary (Last 24 hours) at 05/26/16 0957 Last data filed at 05/25/16 1506  Gross per 24 hour   Intake               50 ml  Output                0 ml  Net               50 ml    Exam Awake , pleasantly confused, No new F.N deficits, Normal affect [place].AT,PERRAL Supple Neck,No JVD, No cervical lymphadenopathy appriciated.  Symmetrical Chest wall movement, Good air movement bilaterally, CTAB RRR,No Gallops,Rubs or new Murmurs, No Parasternal Heave +ve B.Sounds, Abd Soft, Non tender, No organomegaly appriciated, No rebound -guarding or rigidity. No Cyanosis, Clubbing or edema, No new Rash or bruise   Data Review   CBC w Diff: Lab Results  Component Value Date   WBC 8.7 05/26/2016   HGB 8.9 (L) 05/26/2016   HCT 27.8 (L) 05/26/2016   PLT 340 05/26/2016   LYMPHOPCT 14 05/08/2016   MONOPCT 8 05/08/2016   EOSPCT 7 05/08/2016   BASOPCT 1 05/08/2016    CMP: Lab Results  Component Value Date   NA 141 05/24/2016   K 3.6 05/24/2016   CL 99 (L) 05/24/2016   CO2 24 05/24/2016   BUN 86 (H) 05/24/2016   CREATININE 10.89 (H) 05/24/2016   PROT 6.4 (L) 05/19/2016   ALBUMIN 2.7 (L) 05/24/2016   BILITOT 0.9 05/19/2016   ALKPHOS 75 05/19/2016   AST 36 05/19/2016   ALT 178 (H) 05/19/2016  .   Total Time in preparing paper work, data evaluation and todays exam - 35 minutes  Thurnell Lose M.D on 05/26/2016 at 9:57 AM  [facility]   Office  [phone]

## 2016-05-26 NOTE — Discharge Instructions (Signed)
Follow with Primary MD [name], MD in 7 days   Get CBC, CMP, 2 view Chest X ray checked  by Primary MD or SNF MD in 5-7 days ( we routinely change or add medications that can affect your baseline labs and fluid status, therefore we recommend that you get the mentioned basic workup next visit with your PCP, your PCP may decide not to get them or add new tests based on their clinical decision)   Activity: As tolerated with Full fall precautions use walker/cane & assistance as needed   Disposition SNF   Diet:   DIET - DYS 1 Room service appropriate? Yes; Fluid consistency: Nectar Thick with feeding assistance and aspiration precautions.  For Heart failure patients - Check your Weight same time everyday, if you gain over 2 pounds, or you develop in leg swelling, experience more shortness of breath or chest pain, call your Primary MD immediately. Follow Cardiac Low Salt Diet and 1.5 lit/day fluid restriction.   On your next visit with your primary care physician please Get Medicines reviewed and adjusted.   Please request your Prim.MD to go over all Hospital Tests and Procedure/Radiological results at the follow up, please get all Hospital records sent to your Prim MD by signing hospital release before you go home.   If you experience worsening of your admission symptoms, develop shortness of breath, life threatening emergency, suicidal or homicidal thoughts you must seek medical attention immediately by calling 911 or calling your MD immediately  if symptoms less severe.  You Must read complete instructions/literature along with all the possible adverse reactions/side effects for all the Medicines you take and that have been prescribed to you. Take any new Medicines after you have completely understood and accpet all the possible adverse reactions/side effects.   Do not drive, operate heavy machinery, perform activities at heights, swimming or participation in water activities or provide  baby sitting services if your were admitted for syncope or siezures until you have seen by Primary MD or a Neurologist and advised to do so again.  Do not drive when taking Pain medications.    Do not take more than prescribed Pain, Sleep and Anxiety Medications  Special Instructions: If you have smoked or chewed Tobacco  in the last 2 yrs please stop smoking, stop any regular Alcohol  and or any Recreational drug use.  Wear Seat belts while driving.   Please note  You were cared for by a hospitalist during your hospital stay. If you have any questions about your discharge medications or the care you received while you were in the hospital after you are discharged, you can call the unit and asked to speak with the hospitalist on call if the hospitalist that took care of you is not available. Once you are discharged, your primary care physician will handle any further medical issues. Please note that NO REFILLS for any discharge medications will be authorized once you are discharged, as it is imperative that you return to your primary care physician (or establish a relationship with a primary care physician if you do not have one) for your aftercare needs so that they can reassess your need for medications and monitor your lab values.

## 2016-05-26 NOTE — Clinical Social Work Placement (Signed)
   CLINICAL SOCIAL WORK PLACEMENT  NOTE  Date:  05/26/2016  Patient Details  Name: Shawna [name] MRN: [id number] Date of Birth: 1940/12/11  Clinical Social Work is seeking post-discharge placement for this patient at the [place] level of care (*CSW will initial, date and re-position this form in  chart as items are completed):  Yes   Patient/family provided with [facility] Work Department's list of facilities offering this level of care within the geographic area requested by the patient (or if unable, by the patient's family).  Yes   Patient/family informed of their freedom to choose among providers that offer the needed level of care, that participate in Medicare, Medicaid or managed care program needed by the patient, have an available bed and are willing to accept the patient.  Yes   Patient/family informed of [facility]'s ownership interest in [facility] and [facility], as well as of the fact that they are under no obligation to receive care at these facilities.  PASRR submitted to EDS on       PASRR number received on       Existing PASRR number confirmed on 05/23/16     FL2 transmitted to all facilities in geographic area requested by pt/family on       FL2 transmitted to all facilities within larger geographic area on       Patient informed that his/her managed care company has contracts with or will negotiate with certain facilities, including the following:        Yes   Patient/family informed of bed offers received.  Patient chooses bed at [facility]     Physician recommends and patient chooses bed at      Patient to be transferred to [facility] on 05/26/16.  Patient to be transferred to facility by [facility]     Patient family notified on 05/26/16 of transfer.  Name of family member notified:  Mr. Ragan, Husband     PHYSICIAN       Additional Comment: CSW signing off.     _______________________________________________ Ihor Gully, LCSW 05/26/2016, 11:32 AM

## 2016-05-26 NOTE — NC FL2 (Deleted)
[place] MEDICAID FL2 LEVEL OF CARE SCREENING TOOL     IDENTIFICATION  Patient Name: Shawna [name] Birthdate: [birth year] Sex: female Admission Date (Current Location): 05/08/2016  [facility] and [place] Number:  [employer] and Address:  [facility],  [address], [place] [address]      Provider Number: [id number]  Attending Physician Name and Address:  Leroy Sea, MD  Relative Name and Phone Number:       Current Level of Care: Hospital Recommended Level of Care: Skilled Nursing Facility Prior Approval Number:    Date Approved/Denied:   PASRR Number:    Discharge Plan: SNF    Current Diagnoses: Patient Active Problem List   Diagnosis Date Noted  . Respiratory failure (HCC)   . Cardiac arrest (HCC)   . Palliative care encounter   . DNR (do not resuscitate) discussion   . Goals of care, counseling/discussion   . Hypertensive crisis without congestive heart failure 05/09/2016  . Acute pulmonary edema (HCC) 04/06/2016  . Acute respiratory failure (HCC) 04/06/2016  . Hypertensive crisis 01/27/2016  . History of colon cancer 01/27/2016  . History of ovarian cancer 01/27/2016  . Hypertensive urgency 01/27/2016  . Narrow complex tachycardia (HCC) 09/08/2015  . SVT (supraventricular tachycardia) (HCC) 09/08/2015  . Influenza A 08/30/2015  . Acute on chronic diastolic CHF (congestive heart failure) (HCC) 05/04/2015  . Unstable angina (HCC) 05/03/2015  . Essential hypertension   . Pain in joint, lower leg 08/14/2014  . Chest pain 11/26/2013  . Small bowel obstruction, partial 05/29/2013  . Chronic diastolic CHF (congestive heart failure) (HCC) 03/22/2013  . GI bleeding 03/21/2013  . Acute blood loss anemia 03/21/2013  . Vaginal odor 03/12/2013  . Vaginal discharge 03/12/2013  . Occlusion and stenosis of carotid artery without mention of cerebral infarction 01/24/2013  . Hx of CABG 07/05/2012  . Carotid artery disease (HCC) 07/05/2012   . Anemia of chronic kidney failure 07/03/2012  . Secondary hyperparathyroidism (HCC) 07/03/2012  . Mitral regurgitation 06/12/2012  . Pneumonia 06/09/2012  . Non-STEMI (non-ST elevated myocardial infarction) (HCC) 06/08/2012  . GERD (gastroesophageal reflux disease) 01/09/2012  . HLD (hyperlipidemia) 01/05/2012  . Coronary atherosclerosis of native coronary artery 12/16/2011  . ESRD on hemodialysis (HCC) 12/16/2011    Orientation RESPIRATION BLADDER Height & Weight     Self  O2 (4L) Continent Weight: 122 lb (55.3 kg) Height:  5\' 1"  (154.9 cm)  BEHAVIORAL SYMPTOMS/MOOD NEUROLOGICAL BOWEL NUTRITION STATUS      Continent Diet (DIET DYS 1. Nectar Thick 1:1 feeder assist; aspiration and reflux precautions. PO only when alert and upright. PO meds whole or crushed as able in puree. )  AMBULATORY STATUS COMMUNICATION OF NEEDS Skin   Extensive Assist Verbally Normal                       Personal Care Assistance Level of Assistance  Bathing, Dressing, Feeding Bathing Assistance: Maximum assistance Feeding assistance: Limited assistance Dressing Assistance: Maximum assistance     Functional Limitations Info  Sight, Hearing, Speech Sight Info: Adequate Hearing Info: Adequate Speech Info: Adequate    SPECIAL CARE FACTORS FREQUENCY  Speech therapy     PT Frequency: 5x/week       Speech Therapy Frequency: min 2x/week      Contractures      Additional Factors Info  Code Status, Allergies, Psychotropic Code Status Info: Full code Allergies Info: Aspirin, Contrast Media, Iron, Penicillins, Plavix, Amlodipine, Bactrim, Dexilant,  Morphine and related, Nitrofurantoin, Prilosec, Venofer, Levaquin, Protonix,  Psychotropic Info: Xanax         Current Medications (05/26/2016):  This is the current hospital active medication list Current Facility-Administered Medications  Medication Dose Route Frequency Provider Last Rate Last Dose  . 0.9 %  sodium chloride infusion  100 mL  Intravenous PRN Salomon Mast, MD      . 0.9 %  sodium chloride infusion   Intravenous Once Penny Pia, MD      . acetaminophen (TYLENOL) suppository 650 mg  650 mg Rectal Q4H PRN Leda Gauze, NP      . alteplase (CATHFLO ACTIVASE) injection 2 mg  2 mg Intracatheter Once PRN Salomon Mast, MD      . amiodarone (PACERONE) tablet 200 mg  200 mg Oral Daily Briscoe Deutscher, MD   200 mg at 05/25/16 0940  . aspirin EC tablet 81 mg  81 mg Oral Daily Briscoe Deutscher, MD   81 mg at 05/25/16 0940  . chlorhexidine gluconate (MEDLINE KIT) (PERIDEX) 0.12 % solution 15 mL  15 mL Mouth Rinse BID Kari Baars, MD   15 mL at 05/25/16 2052  . cloNIDine (CATAPRES) tablet 0.1 mg  0.1 mg Oral BID Jinger Neighbors, NP   0.1 mg at 05/25/16 2215  . epoetin alfa (EPOGEN,PROCRIT) injection 10,000 Units  10,000 Units Subcutaneous Q T,Th,Sa-HD Penny Pia, MD   10,000 Units at 05/24/16 1218   And  . epoetin alfa (EPOGEN,PROCRIT) injection 4,000 Units  4,000 Units Subcutaneous Q T,Th,Sa-HD Penny Pia, MD   4,000 Units at 05/24/16 1216  . famotidine (PEPCID) tablet 20 mg  20 mg Oral Daily Leroy Sea, MD      . fentaNYL (SUBLIMAZE) injection 50 mcg  50 mcg Intravenous Q15 min PRN Kari Baars, MD   50 mcg at 05/17/16 1529  . fentaNYL (SUBLIMAZE) injection 50 mcg  50 mcg Intravenous Q2H PRN Kari Baars, MD   50 mcg at 05/22/16 2158  . fluticasone (FLONASE) 50 MCG/ACT nasal spray 2 spray  2 spray Each Nare Daily PRN Lavone Neri Opyd, MD      . heparin injection 1,000 Units  1,000 Units Dialysis PRN Salomon Mast, MD      . heparin injection 1,000 Units  1,000 Units Dialysis PRN Salomon Mast, MD      . heparin injection 5,000 Units  5,000 Units Subcutaneous Q8H Leroy Sea, MD   5,000 Units at 05/26/16 0523  . hydrALAZINE (APRESOLINE) injection 5 mg  5 mg Intravenous Q6H PRN Penny Pia, MD   5 mg at 05/22/16 2007  . ipratropium-albuterol (DUONEB) 0.5-2.5 (3) MG/3ML nebulizer solution 3 mL   3 mL Nebulization Q4H PRN Briscoe Deutscher, MD   3 mL at 05/11/16 0508  . lidocaine (PF) (XYLOCAINE) 1 % injection 5 mL  5 mL Intradermal PRN Salomon Mast, MD      . lidocaine-prilocaine (EMLA) cream 1 application  1 application Topical PRN Salomon Mast, MD      . MEDLINE mouth rinse  15 mL Mouth Rinse QID Kari Baars, MD   15 mL at 05/25/16 2350  . midazolam (VERSED) injection 1 mg  1 mg Intravenous Q2H PRN Kari Baars, MD   1 mg at 05/19/16 2222  . multivitamin (RENA-VIT) tablet 1 tablet  1 tablet Oral Daily Briscoe Deutscher, MD   1 tablet at 05/25/16 0940  . ondansetron (ZOFRAN) injection 4 mg  4 mg Intravenous Q6H PRN Lavone Neri  Opyd, MD      . pentafluoroprop-tetrafluoroeth (GEBAUERS) aerosol 1 application  1 application Topical PRN Fran Lowes, MD      . polyethylene glycol (MIRALAX / GLYCOLAX) packet 17 g  17 g Per Tube Daily Kathie Dike, MD   17 g at 05/25/16 0940     Discharge Medications: Please see discharge summary for a list of discharge medications.  Relevant Imaging Results:  Relevant Lab Results:   Additional Information SSN [id number], Clydene Pugh, LCSW

## 2016-05-26 NOTE — Progress Notes (Signed)
Subjective: Interval History: Patient offers no complaints. She states that she is feeling much better. Her appetite is good but she wants something to eat.  Objective: Vital signs in last 24 hours: Temp:  [97.9 F (36.6 C)-98.8 F (37.1 C)] 98.3 F (36.8 C) (11/16 0624) Pulse Rate:  [79-87] 79 (11/16 0624) Resp:  [15-20] 20 (11/16 0624) BP: (100-154)/(62-83) 141/67 (11/16 0624) SpO2:  [100 %] 100 % (11/16 0624) Weight change:   Intake/Output from previous day: 11/15 0701 - 11/16 0700 In: 170 [P.O.:120; IV Piggyback:50] Out: -  Intake/Output this shift: No intake/output data recorded.  Patient is alert and in no apparent distress. Chest: She has bilateral inspiratory crackles. Heart: RRR  Extremties: No edema  Lab Results:  Recent Labs  05/24/16 0639 05/26/16 0541  WBC 13.0* 8.7  HGB 6.9* 8.9*  HCT 22.2* 27.8*  PLT 402* 340   BMET:   Recent Labs  05/23/16 0908 05/24/16 0639  NA 141 141  K 3.6 3.6  CL 99* 99*  CO2 26 24  GLUCOSE 95 110*  BUN 69* 86*  CREATININE 9.19* 10.89*  CALCIUM 9.9 9.9   No results for input(s): PTH in the last 72 hours. Iron Studies: No results for input(s): IRON, TIBC, TRANSFERRIN, FERRITIN in the last 72 hours.  Studies/Results: No results found.  I have reviewed the patient's current medications.  Assessment/Plan: Problem #1 Respiratory failure: Patient is on room air. Presently seems to be comfortable. Problem #2 hypertension: Her blood pressure continue to fluctuate but presently seems to be stable. Problem #3 end-stage renal disease: She is status post hemodialysis on Tuesday. Her dialysis was shortened because of restlessness and agitation. Her vital signs were stable. Problem #4 anemia: Her hemoglobin is low but better. Problem #5 fluid management: No significant sign of fluid overload. Problem #6 metabolic bone disease: Her calcium is normal but her phosphorus is high . Pa Plan: 1] will make arrangements for dialysis  today 2] We'.ll check renal panel and CBC in the morning. 3] Continue with Epogen with dialysis.    LOS: 17 days   Belvin Gauss S 05/26/2016,8:28 AM

## 2016-05-26 NOTE — NC FL2 (Signed)
[place] MEDICAID FL2 LEVEL OF CARE SCREENING TOOL     IDENTIFICATION  Patient Name: Shawna [name] Birthdate: [birth year] Sex: female Admission Date (Current Location): 05/08/2016  [facility] and [place] Number:  [employer] and Address:  [facility] [address] [place]      Provider Number: [id number]  Attending Physician Name and Address:  Thurnell Lose, MD  Relative Name and Phone Number:       Current Level of Care: Hospital Recommended Level of Care: [place] Prior Approval Number:    Date Approved/Denied:   PASRR Number: [id number] A Dates: 05/25/16-06/24/16 ([id number] A Dates: 05/25/16-12-15/17)  Discharge Plan: SNF    Current Diagnoses: Patient Active Problem List   Diagnosis Date Noted  . Respiratory failure ([place])   . Cardiac arrest ([place])   . Palliative care encounter   . DNR (do not resuscitate) discussion   . Goals of care, counseling/discussion   . Hypertensive crisis without congestive heart failure 05/09/2016  . Acute pulmonary edema ([place]) 04/06/2016  . Acute respiratory failure ([place]) 04/06/2016  . Hypertensive crisis 01/27/2016  . History of colon cancer 01/27/2016  . History of ovarian cancer 01/27/2016  . Hypertensive urgency 01/27/2016  . Narrow complex tachycardia ([place]) 09/08/2015  . SVT (supraventricular tachycardia) ([place]) 09/08/2015  . Influenza A 08/30/2015  . Acute on chronic diastolic CHF (congestive heart failure) ([place]) 05/04/2015  . Unstable angina ([place]) 05/03/2015  . Essential hypertension   . Pain in joint, lower leg 08/14/2014  . Chest pain 11/26/2013  . Small bowel obstruction, partial 05/29/2013  . Chronic diastolic CHF (congestive heart failure) ([place]) 03/22/2013  . GI bleeding 03/21/2013  . Acute blood loss anemia 03/21/2013  . Vaginal odor 03/12/2013  . Vaginal discharge 03/12/2013  . Occlusion and stenosis of carotid artery without mention of cerebral infarction  01/24/2013  . Hx of CABG 07/05/2012  . Carotid artery disease ([place]) 07/05/2012  . Anemia of chronic kidney failure 07/03/2012  . Secondary hyperparathyroidism ([place]) 07/03/2012  . Mitral regurgitation 06/12/2012  . Pneumonia 06/09/2012  . Non-STEMI (non-ST elevated myocardial infarction) ([place]) 06/08/2012  . GERD (gastroesophageal reflux disease) 01/09/2012  . HLD (hyperlipidemia) 01/05/2012  . Coronary atherosclerosis of native coronary artery 12/16/2011  . ESRD on hemodialysis ([place]) 12/16/2011    Orientation RESPIRATION BLADDER Height & Weight     Self  O2 (4L) Continent Weight: 122 lb (55.3 kg) Height:  5' 1"  (154.9 cm)  BEHAVIORAL SYMPTOMS/MOOD NEUROLOGICAL BOWEL NUTRITION STATUS      Continent Diet (DIET DYS 1. Nectar Thick 1:1 feeder assist; aspiration and reflux precautions. PO only when alert and upright. PO meds whole or crushed as able in puree. )  AMBULATORY STATUS COMMUNICATION OF NEEDS Skin   Extensive Assist Verbally Normal                       Personal Care Assistance Level of Assistance  Bathing, Dressing, Feeding Bathing Assistance: Maximum assistance Feeding assistance: Limited assistance Dressing Assistance: Maximum assistance     Functional Limitations Info  Sight, Hearing, Speech Sight Info: Adequate Hearing Info: Adequate Speech Info: Adequate    SPECIAL CARE FACTORS FREQUENCY  Speech therapy     PT Frequency: 5x/week       Speech Therapy Frequency: min 2x/week      Contractures      Additional Factors Info  Code Status, Allergies, Psychotropic Code Status Info: Full code Allergies Info: Aspirin, Contrast Media, Iron, Penicillins,  Plavix, Amlodipine, Bactrim, Dexilant, Morphine and related, Nitrofurantoin, Prilosec, Venofer, Levaquin, Protonix,  Psychotropic Info: Xanax         Current Medications (05/26/2016):  This is the current hospital active medication list Current Facility-Administered Medications  Medication Dose Route  Frequency Provider Last Rate Last Dose  . 0.9 %  sodium chloride infusion  100 mL Intravenous PRN Fran Lowes, MD      . 0.9 %  sodium chloride infusion   Intravenous Once Velvet Bathe, MD      . acetaminophen (TYLENOL) suppository 650 mg  650 mg Rectal Q4H PRN [name], NP      . alteplase (CATHFLO ACTIVASE) injection 2 mg  2 mg Intracatheter Once PRN Fran Lowes, MD      . amiodarone (PACERONE) tablet 200 mg  200 mg Oral Daily Vianne Bulls, MD   200 mg at 05/25/16 0940  . aspirin EC tablet 81 mg  81 mg Oral Daily Vianne Bulls, MD   81 mg at 05/25/16 0940  . chlorhexidine gluconate (MEDLINE KIT) (PERIDEX) 0.12 % solution 15 mL  15 mL Mouth Rinse BID Sinda Du, MD   15 mL at 05/25/16 2052  . cloNIDine (CATAPRES) tablet 0.1 mg  0.1 mg Oral BID Ritta Slot, NP   0.1 mg at 05/25/16 2215  . epoetin alfa (EPOGEN,PROCRIT) injection 10,000 Units  10,000 Units Subcutaneous Q T,Th,Sa-HD Velvet Bathe, MD   10,000 Units at 05/24/16 1218   And  . epoetin alfa (EPOGEN,PROCRIT) injection 4,000 Units  4,000 Units Subcutaneous Q T,Th,Sa-HD Velvet Bathe, MD   4,000 Units at 05/24/16 1216  . famotidine (PEPCID) tablet 20 mg  20 mg Oral Daily Thurnell Lose, MD      . fentaNYL (SUBLIMAZE) injection 50 mcg  50 mcg Intravenous Q15 min PRN Sinda Du, MD   50 mcg at 05/17/16 1529  . fentaNYL (SUBLIMAZE) injection 50 mcg  50 mcg Intravenous Q2H PRN Sinda Du, MD   50 mcg at 05/22/16 2158  . fluticasone (FLONASE) 50 MCG/ACT nasal spray 2 spray  2 spray Each Nare Daily PRN Ilene Qua Opyd, MD      . heparin injection 1,000 Units  1,000 Units Dialysis PRN Fran Lowes, MD      . heparin injection 1,000 Units  1,000 Units Dialysis PRN Fran Lowes, MD      . heparin injection 5,000 Units  5,000 Units Subcutaneous Q8H Thurnell Lose, MD   5,000 Units at 05/26/16 0523  . hydrALAZINE (APRESOLINE) injection 5 mg  5 mg Intravenous Q6H PRN Velvet Bathe, MD   5 mg at 05/22/16  2007  . ipratropium-albuterol (DUONEB) 0.5-2.5 (3) MG/3ML nebulizer solution 3 mL  3 mL Nebulization Q4H PRN Vianne Bulls, MD   3 mL at 05/11/16 0508  . lidocaine (PF) (XYLOCAINE) 1 % injection 5 mL  5 mL Intradermal PRN Fran Lowes, MD      . lidocaine-prilocaine (EMLA) cream 1 application  1 application Topical PRN Fran Lowes, MD      . MEDLINE mouth rinse  15 mL Mouth Rinse QID Sinda Du, MD   15 mL at 05/25/16 2350  . midazolam (VERSED) injection 1 mg  1 mg Intravenous Q2H PRN Sinda Du, MD   1 mg at 05/19/16 2222  . multivitamin (RENA-VIT) tablet 1 tablet  1 tablet Oral Daily Vianne Bulls, MD   1 tablet at 05/25/16 0940  . ondansetron (ZOFRAN) injection 4 mg  4 mg Intravenous  Q6H PRN Vianne Bulls, MD      . pentafluoroprop-tetrafluoroeth (GEBAUERS) aerosol 1 application  1 application Topical PRN Fran Lowes, MD      . polyethylene glycol (MIRALAX / GLYCOLAX) packet 17 g  17 g Per Tube Daily Kathie Dike, MD   17 g at 05/25/16 0940     Discharge Medications: Please see discharge summary for a list of discharge medications.  Relevant Imaging Results:  Relevant Lab Results:   Additional Information SSN [id number] Clydene Pugh, LCSW

## 2016-05-26 NOTE — Progress Notes (Signed)
Key Points: Use following P&T approved IV to PO change policy.  Description contains the criteria that are approved Note: Policy Excludes:  Esophagectomy patientsPHARMACIST - PHYSICIAN COMMUNICATION DR:   TRH CONCERNING: IV to Oral Route Change Policy  RECOMMENDATION: This patient is receiving Pepcid by the intravenous route.  Based on criteria approved by the Pharmacy and Therapeutics Committee, the intravenous medication(s) is/are being converted to the equivalent oral dose form(s).   DESCRIPTION: These criteria include:  The patient is eating (either orally or via tube) and/or has been taking other orally administered medications for a least 24 hours  The patient has no evidence of active gastrointestinal bleeding or impaired GI absorption (gastrectomy, short bowel, patient on TNA or NPO).  If you have questions about this conversion, please contact the Pharmacy Department  [x]   [phone] )  [facility] []   [phone] )  [facility] []   [phone] )  [facility] []   [phone] )  [facility] []   [phone] )  [place], [place], [facility] 05/26/2016 9:45 AM

## 2016-05-26 NOTE — Progress Notes (Signed)
Discharged to [facility] skilled nursing center, report called and given to Theophilus Bones LPN. Family at bedside. Transported vis [facility] to awaiting facility.

## 2016-05-27 DIAGNOSIS — N186 End stage renal disease: Secondary | ICD-10-CM | POA: Diagnosis not present

## 2016-05-27 DIAGNOSIS — Z992 Dependence on renal dialysis: Secondary | ICD-10-CM | POA: Diagnosis not present

## 2016-05-27 LAB — ACETYLCHOLINE RECEPTOR AB, ALL
ACETYLCHOL BLOCK AB: 20 % (ref 0–25)
Acetylcholine Modulat Ab: <12 % (ref 0–20)

## 2016-05-30 DIAGNOSIS — N186 End stage renal disease: Secondary | ICD-10-CM | POA: Diagnosis not present

## 2016-05-30 DIAGNOSIS — N185 Chronic kidney disease, stage 5: Secondary | ICD-10-CM | POA: Diagnosis not present

## 2016-05-30 DIAGNOSIS — Z992 Dependence on renal dialysis: Secondary | ICD-10-CM | POA: Diagnosis not present

## 2016-05-30 LAB — TYPE AND SCREEN
ABO/RH(D): O POS
Antibody Screen: POSITIVE
DAT, IgG: POSITIVE
Donor AG Type: NEGATIVE
Donor AG Type: NEGATIVE
Unit division: 0
Unit division: 0
Unit division: 0

## 2016-06-01 DIAGNOSIS — N186 End stage renal disease: Secondary | ICD-10-CM | POA: Diagnosis not present

## 2016-06-01 DIAGNOSIS — Z992 Dependence on renal dialysis: Secondary | ICD-10-CM | POA: Diagnosis not present

## 2016-06-03 DIAGNOSIS — Z992 Dependence on renal dialysis: Secondary | ICD-10-CM | POA: Diagnosis not present

## 2016-06-03 DIAGNOSIS — N186 End stage renal disease: Secondary | ICD-10-CM | POA: Diagnosis not present

## 2016-06-06 DIAGNOSIS — N186 End stage renal disease: Secondary | ICD-10-CM | POA: Diagnosis not present

## 2016-06-06 DIAGNOSIS — Z992 Dependence on renal dialysis: Secondary | ICD-10-CM | POA: Diagnosis not present

## 2016-06-08 DIAGNOSIS — Z992 Dependence on renal dialysis: Secondary | ICD-10-CM | POA: Diagnosis not present

## 2016-06-08 DIAGNOSIS — N185 Chronic kidney disease, stage 5: Secondary | ICD-10-CM | POA: Diagnosis not present

## 2016-06-08 DIAGNOSIS — N186 End stage renal disease: Secondary | ICD-10-CM | POA: Diagnosis not present

## 2016-06-09 DIAGNOSIS — N186 End stage renal disease: Secondary | ICD-10-CM | POA: Diagnosis not present

## 2016-06-09 DIAGNOSIS — Z992 Dependence on renal dialysis: Secondary | ICD-10-CM | POA: Diagnosis not present

## 2016-06-10 DIAGNOSIS — Z992 Dependence on renal dialysis: Secondary | ICD-10-CM | POA: Diagnosis not present

## 2016-06-10 DIAGNOSIS — N186 End stage renal disease: Secondary | ICD-10-CM | POA: Diagnosis not present

## 2016-06-13 DIAGNOSIS — Z992 Dependence on renal dialysis: Secondary | ICD-10-CM | POA: Diagnosis not present

## 2016-06-13 DIAGNOSIS — N186 End stage renal disease: Secondary | ICD-10-CM | POA: Diagnosis not present

## 2016-06-15 DIAGNOSIS — N186 End stage renal disease: Secondary | ICD-10-CM | POA: Diagnosis not present

## 2016-06-15 DIAGNOSIS — Z992 Dependence on renal dialysis: Secondary | ICD-10-CM | POA: Diagnosis not present

## 2016-06-16 DIAGNOSIS — Z888 Allergy status to other drugs, medicaments and biological substances status: Secondary | ICD-10-CM | POA: Diagnosis not present

## 2016-06-16 DIAGNOSIS — R531 Weakness: Secondary | ICD-10-CM | POA: Diagnosis not present

## 2016-06-16 DIAGNOSIS — D649 Anemia, unspecified: Secondary | ICD-10-CM | POA: Diagnosis not present

## 2016-06-16 DIAGNOSIS — Z79899 Other long term (current) drug therapy: Secondary | ICD-10-CM | POA: Diagnosis not present

## 2016-06-16 DIAGNOSIS — I10 Essential (primary) hypertension: Secondary | ICD-10-CM | POA: Diagnosis not present

## 2016-06-16 DIAGNOSIS — Z992 Dependence on renal dialysis: Secondary | ICD-10-CM | POA: Diagnosis not present

## 2016-06-16 DIAGNOSIS — D631 Anemia in chronic kidney disease: Secondary | ICD-10-CM | POA: Diagnosis not present

## 2016-06-16 DIAGNOSIS — I120 Hypertensive chronic kidney disease with stage 5 chronic kidney disease or end stage renal disease: Secondary | ICD-10-CM | POA: Diagnosis not present

## 2016-06-16 DIAGNOSIS — Z7901 Long term (current) use of anticoagulants: Secondary | ICD-10-CM | POA: Diagnosis not present

## 2016-06-16 DIAGNOSIS — N186 End stage renal disease: Secondary | ICD-10-CM | POA: Diagnosis not present

## 2016-06-16 DIAGNOSIS — J9811 Atelectasis: Secondary | ICD-10-CM | POA: Diagnosis not present

## 2016-06-16 DIAGNOSIS — I2510 Atherosclerotic heart disease of native coronary artery without angina pectoris: Secondary | ICD-10-CM | POA: Diagnosis not present

## 2016-06-16 DIAGNOSIS — Z8673 Personal history of transient ischemic attack (TIA), and cerebral infarction without residual deficits: Secondary | ICD-10-CM | POA: Diagnosis not present

## 2016-06-18 DIAGNOSIS — N186 End stage renal disease: Secondary | ICD-10-CM | POA: Diagnosis not present

## 2016-06-18 DIAGNOSIS — Z992 Dependence on renal dialysis: Secondary | ICD-10-CM | POA: Diagnosis not present

## 2016-06-20 DIAGNOSIS — N186 End stage renal disease: Secondary | ICD-10-CM | POA: Diagnosis not present

## 2016-06-20 DIAGNOSIS — Z992 Dependence on renal dialysis: Secondary | ICD-10-CM | POA: Diagnosis not present

## 2016-06-21 DIAGNOSIS — N186 End stage renal disease: Secondary | ICD-10-CM | POA: Diagnosis not present

## 2016-06-21 DIAGNOSIS — I2510 Atherosclerotic heart disease of native coronary artery without angina pectoris: Secondary | ICD-10-CM | POA: Diagnosis not present

## 2016-06-21 DIAGNOSIS — I501 Left ventricular failure: Secondary | ICD-10-CM | POA: Diagnosis not present

## 2016-06-21 DIAGNOSIS — I5032 Chronic diastolic (congestive) heart failure: Secondary | ICD-10-CM | POA: Diagnosis not present

## 2016-06-21 DIAGNOSIS — I10 Essential (primary) hypertension: Secondary | ICD-10-CM | POA: Diagnosis not present

## 2016-06-21 DIAGNOSIS — M6281 Muscle weakness (generalized): Secondary | ICD-10-CM | POA: Diagnosis not present

## 2016-06-21 DIAGNOSIS — D631 Anemia in chronic kidney disease: Secondary | ICD-10-CM | POA: Diagnosis not present

## 2016-06-22 DIAGNOSIS — Z992 Dependence on renal dialysis: Secondary | ICD-10-CM | POA: Diagnosis not present

## 2016-06-22 DIAGNOSIS — N186 End stage renal disease: Secondary | ICD-10-CM | POA: Diagnosis not present

## 2016-06-24 DIAGNOSIS — N186 End stage renal disease: Secondary | ICD-10-CM | POA: Diagnosis not present

## 2016-06-24 DIAGNOSIS — Z992 Dependence on renal dialysis: Secondary | ICD-10-CM | POA: Diagnosis not present

## 2016-06-27 DIAGNOSIS — I10 Essential (primary) hypertension: Secondary | ICD-10-CM | POA: Diagnosis not present

## 2016-06-27 DIAGNOSIS — I2510 Atherosclerotic heart disease of native coronary artery without angina pectoris: Secondary | ICD-10-CM | POA: Diagnosis not present

## 2016-06-27 DIAGNOSIS — N186 End stage renal disease: Secondary | ICD-10-CM | POA: Diagnosis not present

## 2016-06-27 DIAGNOSIS — Z992 Dependence on renal dialysis: Secondary | ICD-10-CM | POA: Diagnosis not present

## 2016-06-27 DIAGNOSIS — D631 Anemia in chronic kidney disease: Secondary | ICD-10-CM | POA: Diagnosis not present

## 2016-06-27 DIAGNOSIS — I501 Left ventricular failure: Secondary | ICD-10-CM | POA: Diagnosis not present

## 2016-06-28 ENCOUNTER — Telehealth: Payer: Self-pay | Admitting: Cardiovascular Disease

## 2016-06-28 DIAGNOSIS — I10 Essential (primary) hypertension: Secondary | ICD-10-CM | POA: Diagnosis not present

## 2016-06-28 DIAGNOSIS — D631 Anemia in chronic kidney disease: Secondary | ICD-10-CM | POA: Diagnosis not present

## 2016-06-28 DIAGNOSIS — I501 Left ventricular failure: Secondary | ICD-10-CM | POA: Diagnosis not present

## 2016-06-28 DIAGNOSIS — I2510 Atherosclerotic heart disease of native coronary artery without angina pectoris: Secondary | ICD-10-CM | POA: Diagnosis not present

## 2016-06-28 NOTE — Telephone Encounter (Signed)
Patient called again

## 2016-06-28 NOTE — Telephone Encounter (Signed)
LM to return call X2

## 2016-06-28 NOTE — Telephone Encounter (Signed)
Pt called stating she's having swelling in her feet °

## 2016-06-29 DIAGNOSIS — Z79899 Other long term (current) drug therapy: Secondary | ICD-10-CM | POA: Diagnosis not present

## 2016-06-29 DIAGNOSIS — E785 Hyperlipidemia, unspecified: Secondary | ICD-10-CM | POA: Diagnosis not present

## 2016-06-29 DIAGNOSIS — N186 End stage renal disease: Secondary | ICD-10-CM | POA: Diagnosis not present

## 2016-06-29 DIAGNOSIS — Z992 Dependence on renal dialysis: Secondary | ICD-10-CM | POA: Diagnosis not present

## 2016-06-29 NOTE — Telephone Encounter (Signed)
LM on VM.

## 2016-06-30 DIAGNOSIS — D631 Anemia in chronic kidney disease: Secondary | ICD-10-CM | POA: Diagnosis not present

## 2016-06-30 DIAGNOSIS — I2510 Atherosclerotic heart disease of native coronary artery without angina pectoris: Secondary | ICD-10-CM | POA: Diagnosis not present

## 2016-06-30 DIAGNOSIS — I501 Left ventricular failure: Secondary | ICD-10-CM | POA: Diagnosis not present

## 2016-06-30 DIAGNOSIS — I10 Essential (primary) hypertension: Secondary | ICD-10-CM | POA: Diagnosis not present

## 2016-06-30 NOTE — Telephone Encounter (Signed)
Pt is on dialysis M-W-F and has noted swollen feet for past week.Swelling does not extend beyond feet themselves.No redness or pain. She notes shoes are tight. She elevates them when possible and eats no sodium.Her dry weight is stable at dialysis, denies any other sx's.  Pt states we may leave message at home number [phone]   I will send to provider in [place] office today as Dr Bronson Ing is out of office

## 2016-06-30 NOTE — Telephone Encounter (Signed)
She should make her nephrologist aware of this. They may need to consider removing additional fluid with hemodialysis.    Zandra Abts MD

## 2016-06-30 NOTE — Telephone Encounter (Signed)
Shawna [name] called stating that she is returning a call to the office.please call [phone]

## 2016-06-30 NOTE — Telephone Encounter (Signed)
I relayed info to pt,she will talk with her nephrologist tomorrow

## 2016-07-01 DIAGNOSIS — N186 End stage renal disease: Secondary | ICD-10-CM | POA: Diagnosis not present

## 2016-07-01 DIAGNOSIS — Z992 Dependence on renal dialysis: Secondary | ICD-10-CM | POA: Diagnosis not present

## 2016-07-03 DIAGNOSIS — N186 End stage renal disease: Secondary | ICD-10-CM | POA: Diagnosis not present

## 2016-07-03 DIAGNOSIS — Z992 Dependence on renal dialysis: Secondary | ICD-10-CM | POA: Diagnosis not present

## 2016-07-06 ENCOUNTER — Telehealth: Payer: Self-pay | Admitting: Cardiovascular Disease

## 2016-07-06 ENCOUNTER — Ambulatory Visit (INDEPENDENT_AMBULATORY_CARE_PROVIDER_SITE_OTHER): Payer: Medicare HMO | Admitting: *Deleted

## 2016-07-06 VITALS — BP 166/76 | HR 74

## 2016-07-06 DIAGNOSIS — N186 End stage renal disease: Secondary | ICD-10-CM | POA: Diagnosis not present

## 2016-07-06 DIAGNOSIS — Z992 Dependence on renal dialysis: Secondary | ICD-10-CM | POA: Diagnosis not present

## 2016-07-06 DIAGNOSIS — D631 Anemia in chronic kidney disease: Secondary | ICD-10-CM | POA: Diagnosis not present

## 2016-07-06 DIAGNOSIS — I2510 Atherosclerotic heart disease of native coronary artery without angina pectoris: Secondary | ICD-10-CM | POA: Diagnosis not present

## 2016-07-06 DIAGNOSIS — I10 Essential (primary) hypertension: Secondary | ICD-10-CM | POA: Diagnosis not present

## 2016-07-06 DIAGNOSIS — I501 Left ventricular failure: Secondary | ICD-10-CM | POA: Diagnosis not present

## 2016-07-06 NOTE — Progress Notes (Signed)
Patient walked into office c/o elevated BP.  Just finished dialysis.  Was 200/90 when she left.  Was advised to contact us in regards to her medication.    BP check was done in office now.    Patient stated that she was at [facility] from 05/08/16 to 05/26/2016.    Patient also concerned that her medications were changed at hospital discharge.  Labetalol was stopped & her Clonidine was decreased to 0.1mg  twice a day.  Was previously on a 0.3mg .    Last evening her BP was running in the 200's.    Patient already has appointment scheduled for 07/15/2016 with Dr. Bronson Ing.    Advised to continue to log BP readings in the meantime & bring list with her to OV.  Will fwd this to Dr. Harl Bowie as Dr. Bronson Ing is out of the office currently.

## 2016-07-06 NOTE — Progress Notes (Signed)
Arnoldo Lenis, MD  Laurine Blazer, LPN        Had she taken any of her bp meds prior to her visit, often on dialysis days patients are instructed not to take any bp meds. From her recent discharge she is to take hydralazine 50mg  every 8 hrs prn SBP above 150, has she been doing this. Her bp's have been very up and down historically.

## 2016-07-06 NOTE — Telephone Encounter (Signed)
Patient was seen for BP nurse check.

## 2016-07-06 NOTE — Progress Notes (Signed)
Returned call to patient.  Stated she had not taken any of her medications since midnight last evening.  She stated that Dr. Lowanda Foster had instructed her to go ahead and take her BP meds on her dialysis days, but she does not if her BP is already low.  Patient stated that she did not have Hydralazine on her discharge medicine list.  This medication was stopped at some point in September.  She does have appointment scheduled for 07/15/2016.  Will fwd message to Dr. Bronson Ing as he is back in the office tomorrow.

## 2016-07-06 NOTE — Telephone Encounter (Signed)
Shawna [name] walked into the office stating that she left Dialysis and her BP was 200/90   States tat she was recently discharged from [place] that the Doctor decreased the Clonidine from 0.3 mg to 0.1 mg. Patient states that she has a slight headache.

## 2016-07-07 DIAGNOSIS — I2510 Atherosclerotic heart disease of native coronary artery without angina pectoris: Secondary | ICD-10-CM | POA: Diagnosis not present

## 2016-07-07 DIAGNOSIS — I10 Essential (primary) hypertension: Secondary | ICD-10-CM | POA: Diagnosis not present

## 2016-07-07 DIAGNOSIS — I501 Left ventricular failure: Secondary | ICD-10-CM | POA: Diagnosis not present

## 2016-07-07 DIAGNOSIS — D631 Anemia in chronic kidney disease: Secondary | ICD-10-CM | POA: Diagnosis not present

## 2016-07-08 ENCOUNTER — Emergency Department (HOSPITAL_COMMUNITY): Payer: Medicare HMO

## 2016-07-08 ENCOUNTER — Telehealth: Payer: Self-pay | Admitting: *Deleted

## 2016-07-08 ENCOUNTER — Observation Stay (HOSPITAL_COMMUNITY): Payer: Medicare HMO

## 2016-07-08 ENCOUNTER — Encounter (HOSPITAL_COMMUNITY): Payer: Self-pay | Admitting: Emergency Medicine

## 2016-07-08 ENCOUNTER — Inpatient Hospital Stay (HOSPITAL_COMMUNITY)
Admission: EM | Admit: 2016-07-08 | Discharge: 2016-07-10 | DRG: 304 | Disposition: A | Discharge status: Home / Self Care | Payer: Medicare HMO | Attending: Internal Medicine | Admitting: Internal Medicine

## 2016-07-08 DIAGNOSIS — Z885 Allergy status to narcotic agent status: Secondary | ICD-10-CM

## 2016-07-08 DIAGNOSIS — E7800 Pure hypercholesterolemia, unspecified: Secondary | ICD-10-CM | POA: Diagnosis present

## 2016-07-08 DIAGNOSIS — Z951 Presence of aortocoronary bypass graft: Secondary | ICD-10-CM

## 2016-07-08 DIAGNOSIS — I340 Nonrheumatic mitral (valve) insufficiency: Secondary | ICD-10-CM | POA: Diagnosis present

## 2016-07-08 DIAGNOSIS — I5022 Chronic systolic (congestive) heart failure: Secondary | ICD-10-CM | POA: Diagnosis present

## 2016-07-08 DIAGNOSIS — F419 Anxiety disorder, unspecified: Secondary | ICD-10-CM | POA: Diagnosis present

## 2016-07-08 DIAGNOSIS — I161 Hypertensive emergency: Secondary | ICD-10-CM | POA: Diagnosis present

## 2016-07-08 DIAGNOSIS — Z8673 Personal history of transient ischemic attack (TIA), and cerebral infarction without residual deficits: Secondary | ICD-10-CM

## 2016-07-08 DIAGNOSIS — R51 Headache: Secondary | ICD-10-CM | POA: Diagnosis not present

## 2016-07-08 DIAGNOSIS — I159 Secondary hypertension, unspecified: Secondary | ICD-10-CM

## 2016-07-08 DIAGNOSIS — Z7982 Long term (current) use of aspirin: Secondary | ICD-10-CM

## 2016-07-08 DIAGNOSIS — Z888 Allergy status to other drugs, medicaments and biological substances status: Secondary | ICD-10-CM

## 2016-07-08 DIAGNOSIS — Z992 Dependence on renal dialysis: Secondary | ICD-10-CM

## 2016-07-08 DIAGNOSIS — I132 Hypertensive heart and chronic kidney disease with heart failure and with stage 5 chronic kidney disease, or end stage renal disease: Secondary | ICD-10-CM | POA: Diagnosis present

## 2016-07-08 DIAGNOSIS — Z8543 Personal history of malignant neoplasm of ovary: Secondary | ICD-10-CM

## 2016-07-08 DIAGNOSIS — Z8701 Personal history of pneumonia (recurrent): Secondary | ICD-10-CM

## 2016-07-08 DIAGNOSIS — I5042 Chronic combined systolic (congestive) and diastolic (congestive) heart failure: Secondary | ICD-10-CM | POA: Diagnosis present

## 2016-07-08 DIAGNOSIS — N186 End stage renal disease: Secondary | ICD-10-CM | POA: Diagnosis present

## 2016-07-08 DIAGNOSIS — M109 Gout, unspecified: Secondary | ICD-10-CM | POA: Diagnosis present

## 2016-07-08 DIAGNOSIS — I10 Essential (primary) hypertension: Secondary | ICD-10-CM | POA: Diagnosis not present

## 2016-07-08 DIAGNOSIS — I472 Ventricular tachycardia: Secondary | ICD-10-CM | POA: Diagnosis present

## 2016-07-08 DIAGNOSIS — I169 Hypertensive crisis, unspecified: Secondary | ICD-10-CM | POA: Diagnosis present

## 2016-07-08 DIAGNOSIS — Z955 Presence of coronary angioplasty implant and graft: Secondary | ICD-10-CM

## 2016-07-08 DIAGNOSIS — Z8249 Family history of ischemic heart disease and other diseases of the circulatory system: Secondary | ICD-10-CM

## 2016-07-08 DIAGNOSIS — Z8674 Personal history of sudden cardiac arrest: Secondary | ICD-10-CM

## 2016-07-08 DIAGNOSIS — I252 Old myocardial infarction: Secondary | ICD-10-CM

## 2016-07-08 DIAGNOSIS — K219 Gastro-esophageal reflux disease without esophagitis: Secondary | ICD-10-CM | POA: Diagnosis present

## 2016-07-08 DIAGNOSIS — J189 Pneumonia, unspecified organism: Secondary | ICD-10-CM | POA: Diagnosis present

## 2016-07-08 DIAGNOSIS — M899 Disorder of bone, unspecified: Secondary | ICD-10-CM | POA: Diagnosis present

## 2016-07-08 DIAGNOSIS — I2510 Atherosclerotic heart disease of native coronary artery without angina pectoris: Secondary | ICD-10-CM | POA: Diagnosis present

## 2016-07-08 DIAGNOSIS — Z90710 Acquired absence of both cervix and uterus: Secondary | ICD-10-CM

## 2016-07-08 DIAGNOSIS — R0602 Shortness of breath: Secondary | ICD-10-CM

## 2016-07-08 DIAGNOSIS — Z8711 Personal history of peptic ulcer disease: Secondary | ICD-10-CM

## 2016-07-08 DIAGNOSIS — N2581 Secondary hyperparathyroidism of renal origin: Secondary | ICD-10-CM | POA: Diagnosis present

## 2016-07-08 DIAGNOSIS — D631 Anemia in chronic kidney disease: Secondary | ICD-10-CM | POA: Diagnosis present

## 2016-07-08 DIAGNOSIS — Z79899 Other long term (current) drug therapy: Secondary | ICD-10-CM

## 2016-07-08 DIAGNOSIS — I5032 Chronic diastolic (congestive) heart failure: Secondary | ICD-10-CM | POA: Diagnosis present

## 2016-07-08 DIAGNOSIS — J9811 Atelectasis: Secondary | ICD-10-CM | POA: Diagnosis present

## 2016-07-08 DIAGNOSIS — Z881 Allergy status to other antibiotic agents status: Secondary | ICD-10-CM

## 2016-07-08 DIAGNOSIS — Z880 Allergy status to penicillin: Secondary | ICD-10-CM

## 2016-07-08 DIAGNOSIS — Z833 Family history of diabetes mellitus: Secondary | ICD-10-CM

## 2016-07-08 DIAGNOSIS — Z85038 Personal history of other malignant neoplasm of large intestine: Secondary | ICD-10-CM

## 2016-07-08 LAB — CBC WITH DIFFERENTIAL/PLATELET
Basophils Absolute: 0.1 10*3/uL (ref 0.0–0.1)
Basophils Relative: 2 %
EOS ABS: 0.5 10*3/uL (ref 0.0–0.7)
Eosinophils Relative: 9 %
HCT: 35 % — ABNORMAL LOW (ref 36.0–46.0)
HEMOGLOBIN: 11 g/dL — AB (ref 12.0–15.0)
LYMPHS ABS: 1 10*3/uL (ref 0.7–4.0)
LYMPHS PCT: 18 %
MCH: 32.4 pg (ref 26.0–34.0)
MCHC: 31.4 g/dL (ref 30.0–36.0)
MCV: 103.2 fL — AB (ref 78.0–100.0)
Monocytes Absolute: 0.4 10*3/uL (ref 0.1–1.0)
Monocytes Relative: 8 %
NEUTROS ABS: 3.4 10*3/uL (ref 1.7–7.7)
NEUTROS PCT: 63 %
Platelets: 249 10*3/uL (ref 150–400)
RBC: 3.39 MIL/uL — AB (ref 3.87–5.11)
RDW: 18 % — ABNORMAL HIGH (ref 11.5–15.5)
WBC: 5.4 10*3/uL (ref 4.0–10.5)

## 2016-07-08 LAB — BASIC METABOLIC PANEL
Anion gap: 8 (ref 5–15)
BUN: 21 mg/dL — AB (ref 6–20)
CHLORIDE: 94 mmol/L — AB (ref 101–111)
CO2: 31 mmol/L (ref 22–32)
Calcium: 8.4 mg/dL — ABNORMAL LOW (ref 8.9–10.3)
Creatinine, Ser: 4.81 mg/dL — ABNORMAL HIGH (ref 0.44–1.00)
GFR calc Af Amer: 9 mL/min — ABNORMAL LOW (ref >60)
GFR calc non Af Amer: 8 mL/min — ABNORMAL LOW (ref >60)
Glucose, Bld: 104 mg/dL — ABNORMAL HIGH (ref 65–99)
POTASSIUM: 3.3 mmol/L — AB (ref 3.5–5.1)
SODIUM: 133 mmol/L — AB (ref 135–145)

## 2016-07-08 LAB — TROPONIN I: TROPONIN I: 0.03 ng/mL — AB (ref <0.03)

## 2016-07-08 IMAGING — CT CT HEAD W/O CM
3 series · 16 of 45 positions shown, 19 images · non-contrast
Comparison: Head CTs from [DATE] and [DATE]

CLINICAL DATA: Elevated blood pressure after dialysis currently
with headache. No known injury.

EXAM:
CT HEAD WITHOUT CONTRAST
TECHNIQUE: Contiguous axial images were obtained from the base of the skull
through the vertex without intravenous contrast.

[Series 2: head wo · axial · 0.40mm/px · z∈[+1542,+1657]mm · 10 of 28 slices shown, 13 images]
[im 3/28  brain]
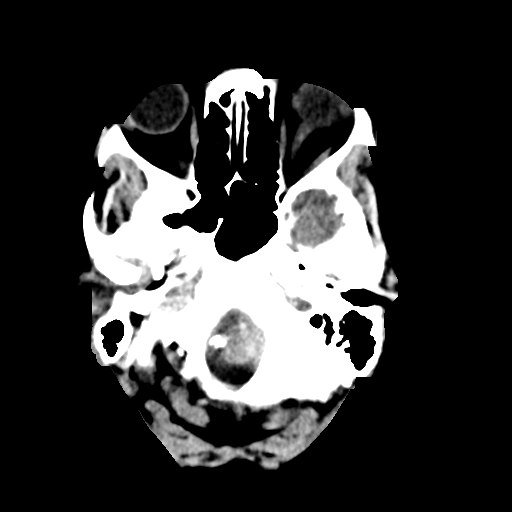
[im 3/28  bone]
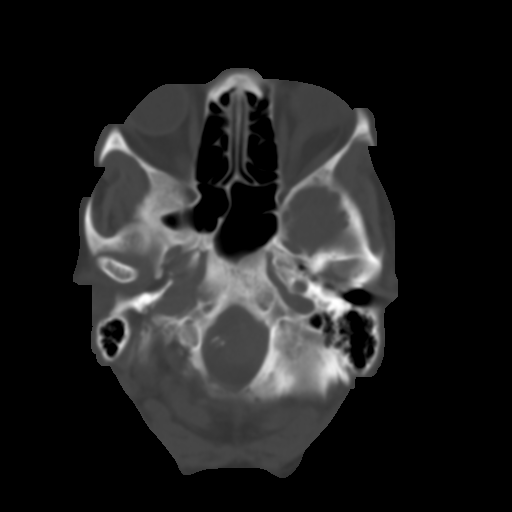
[im 5/28  brain]
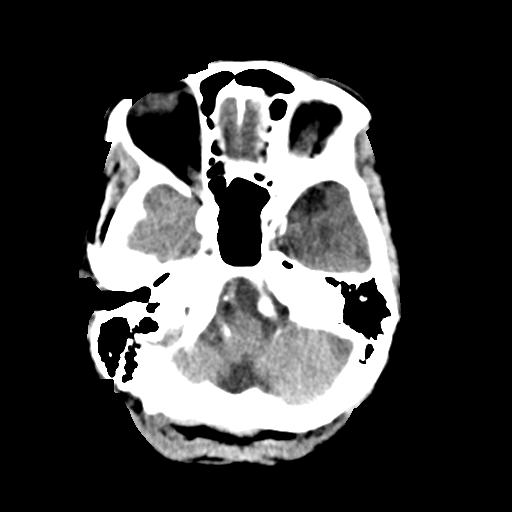
[im 8/28  brain]
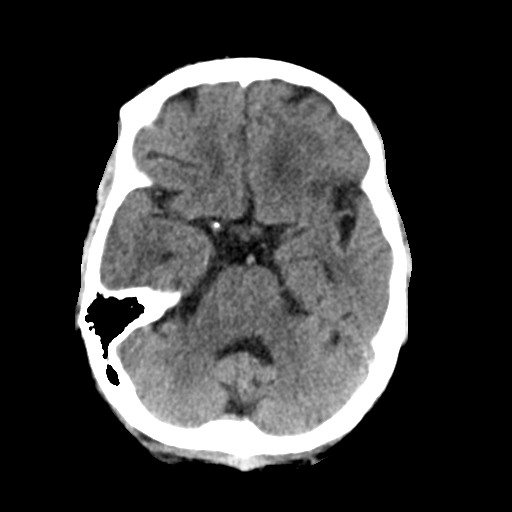
[im 11/28  brain]
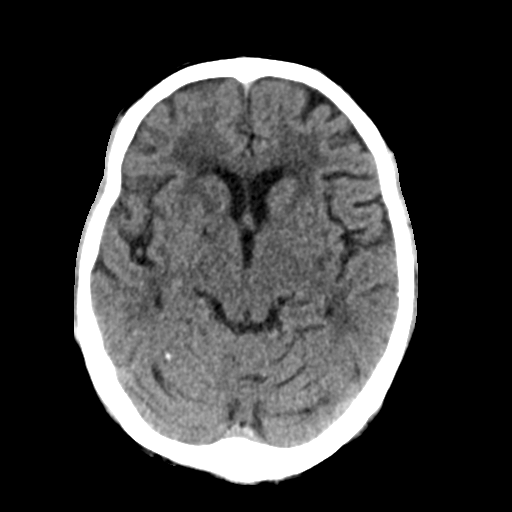
[im 13/28  brain]
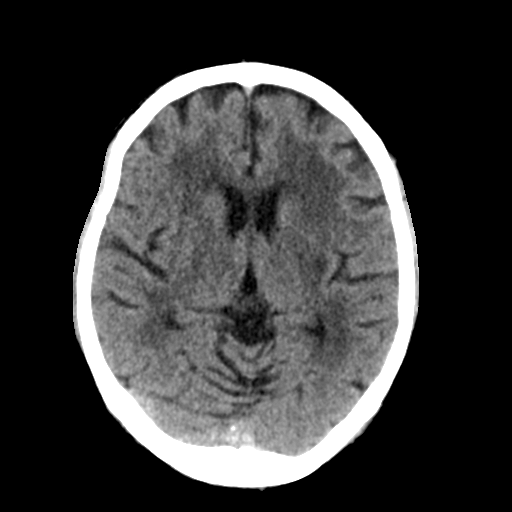
[im 13/28  bone]
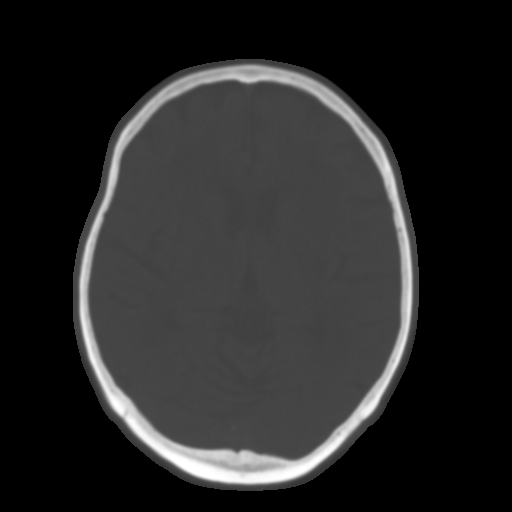
[im 16/28  brain]
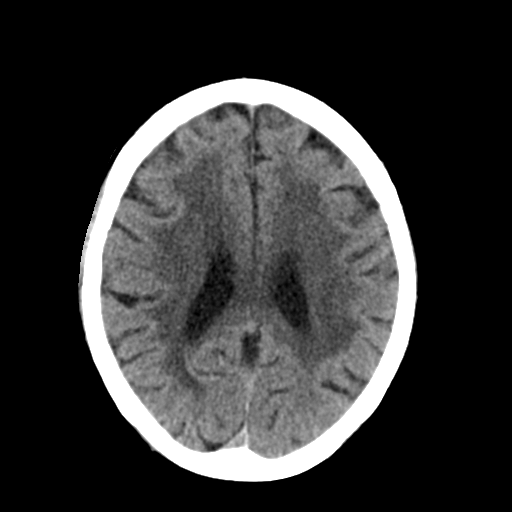
[im 18/28  brain]
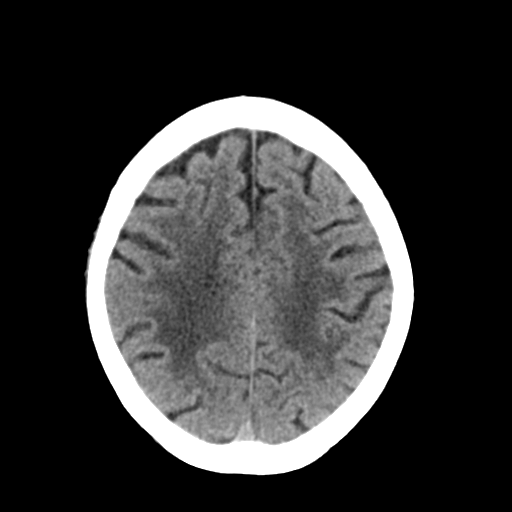
[im 21/28  brain]
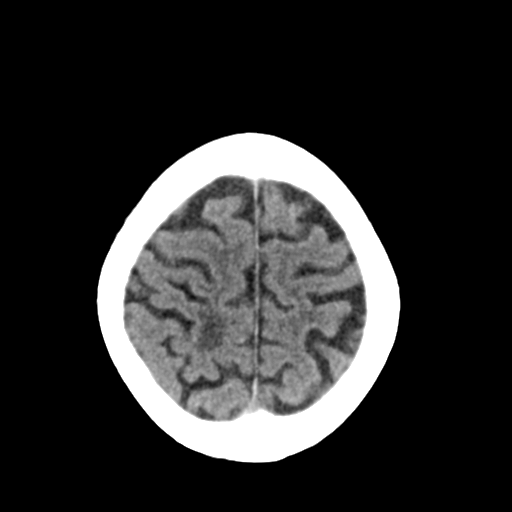
[im 24/28  brain]
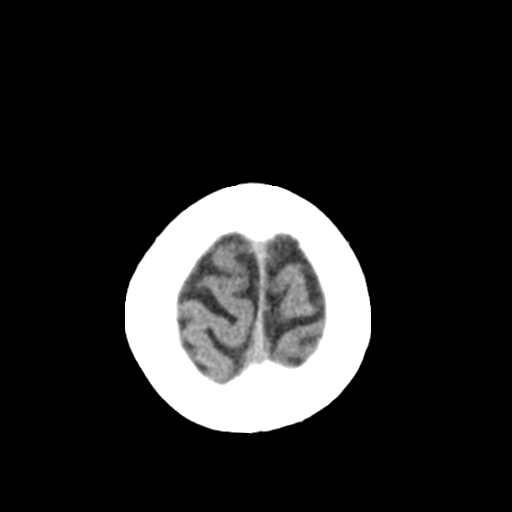
[im 24/28  bone]
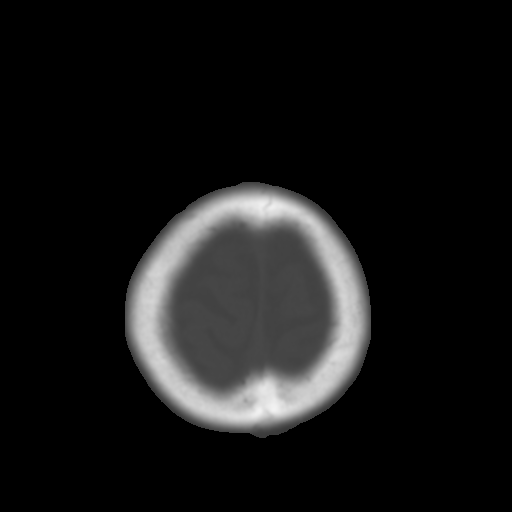
[im 26/28  brain]
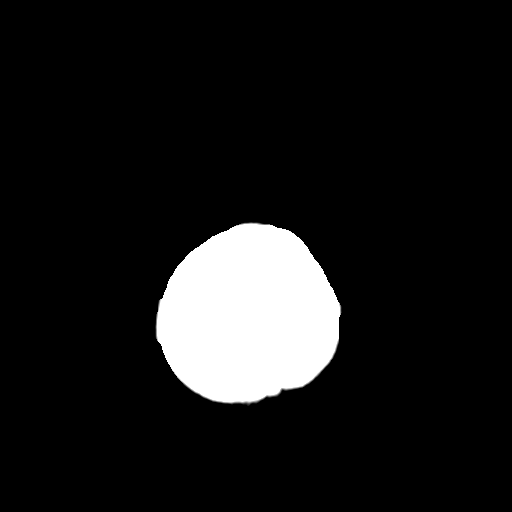

[Series 4: coronal soft tissue · coronal · 0.30mm/px · 3 of 61 slices shown]
[im 21/61  brain]
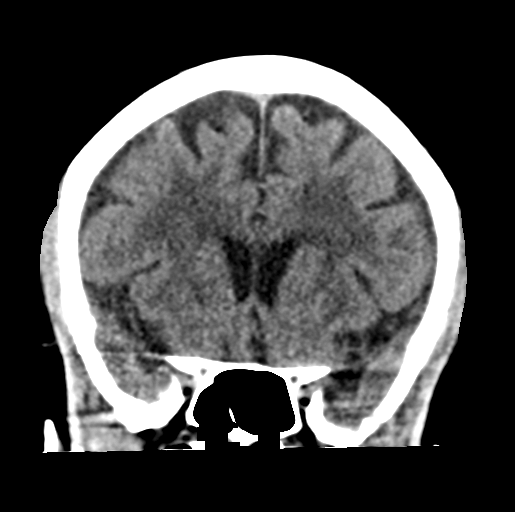
[im 27/61  brain]
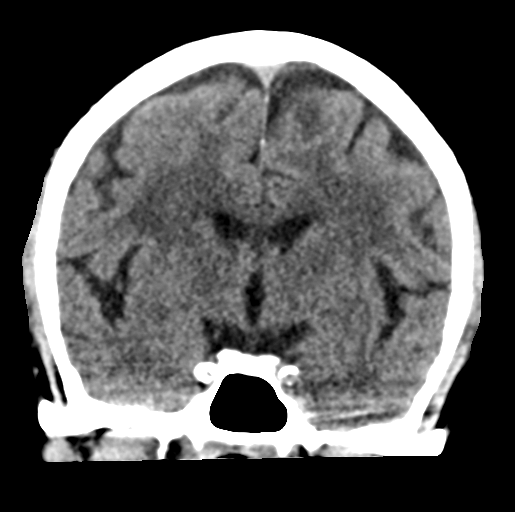
[im 34/61  brain]
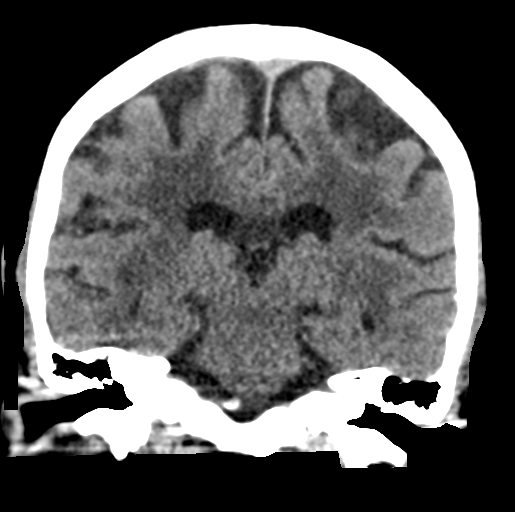

[Series 5: sagittal soft tissue · sagittal · 0.29mm/px · 3 of 50 slices shown]
[im 17/50  brain]
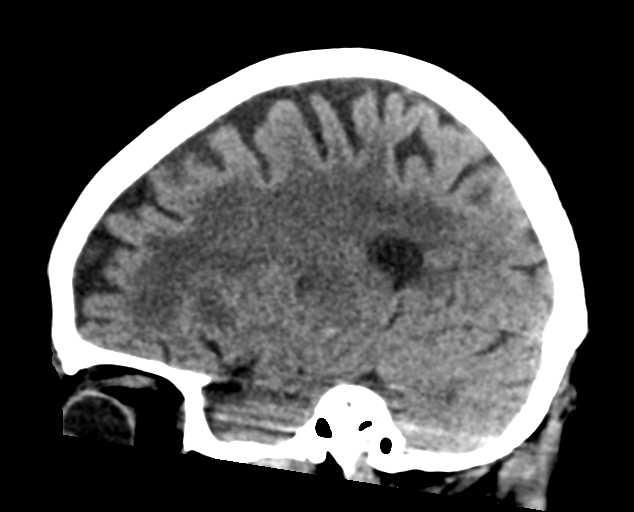
[im 25/50  brain]
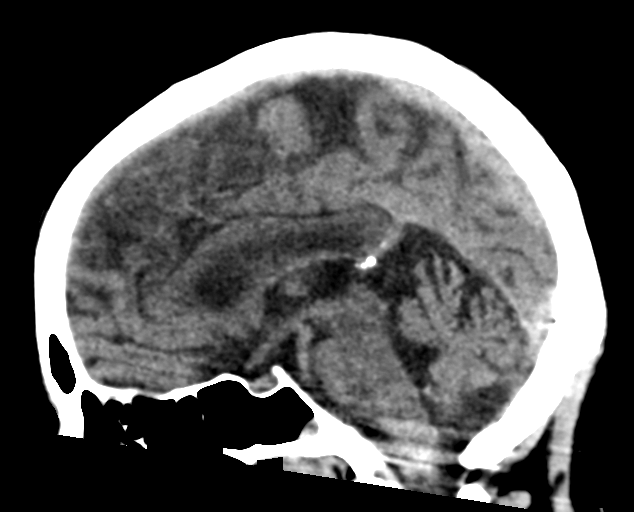
[im 33/50  brain]
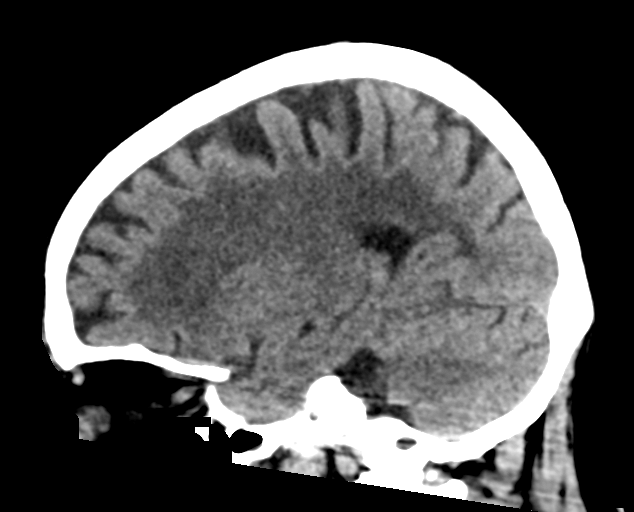

[16 of 45 positions shown; findings below may reference images not displayed]

FINDINGS: Brain: Chronic mild cerebral atrophy without ventricular dilatation.
Chronic small vessel ischemic disease of the white matter noted
bilaterally with chronic left lacunar infarct in the left
putamen/external capsule. No acute intracranial hemorrhage, midline
shift or mass. No extra-axial fluid collection. No effacement of the
basal cisterns.

Vascular: Vascular calcifications in the intracranial carotid
arteries and both vertebral arteries.

Skull: No acute osseous abnormality.

Sinuses/Orbits: Mucous retention cyst in the sphenoid. Orbits are
nonsuspicious mastoids appear clear.

Other: None
IMPRESSION: Chronic mild cerebral atrophy with small vessel ischemic disease of
periventricular white matter. Chronic left putamenal/external
capsule lacunar infarct.

No acute intracranial abnormality noted.

## 2016-07-08 IMAGING — CR DG CHEST 1V PORT
1 series · 1 of 1 positions shown · non-contrast
Comparison: [DATE] CXR

CLINICAL DATA: Dyspnea and cough.

EXAM:
PORTABLE CHEST 1 VIEW

[ap portable]
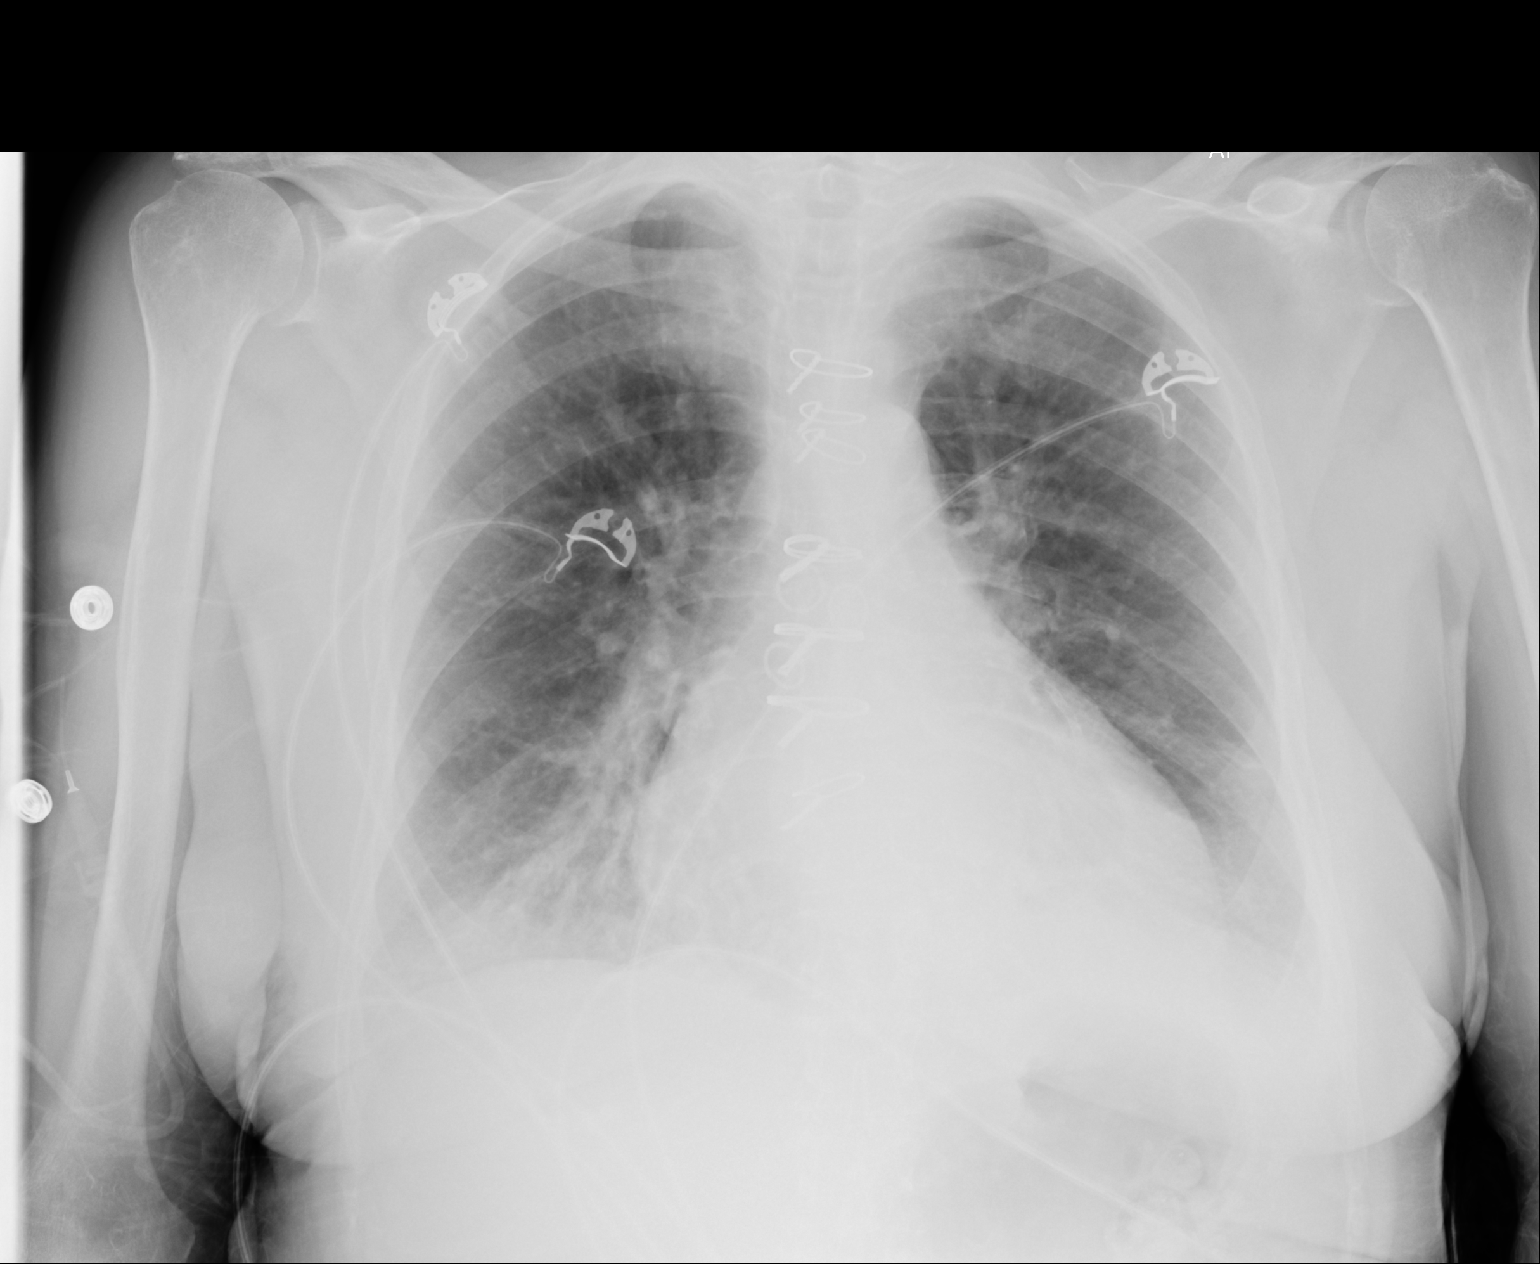

[1 of 1 positions shown; findings below may reference images not displayed]

FINDINGS: Patient is status post median sternotomy. There is stable
cardiomegaly. The aorta is atherosclerotic without aneurysm.
Bibasilar atelectasis right greater than left is noted. Slightly
more confluent appearing airspace opacities at the right lung base
cannot exclude pneumonia. No acute osseous appearing abnormality.
IMPRESSION: Cardiomegaly with aortic atherosclerosis. Bibasilar atelectasis with
findings suspicious for superimposed right lower lobe pneumonia.

## 2016-07-08 MED ORDER — SIMVASTATIN 20 MG PO TABS
20.0000 mg | ORAL_TABLET | Freq: Every day | ORAL | Status: DC
  Administered 2016-07-09 (×2): 20 mg via ORAL
  Filled 2016-07-08: qty 1

## 2016-07-08 MED ORDER — ALPRAZOLAM 0.5 MG PO TABS: 0.2500 mg | ORAL_TABLET | Freq: Every evening | ORAL | Status: DC | PRN

## 2016-07-08 MED ORDER — ONDANSETRON HCL 4 MG PO TABS: 4.0000 mg | ORAL_TABLET | Freq: Four times a day (QID) | ORAL | Status: DC | PRN

## 2016-07-08 MED ORDER — CLONIDINE HCL 0.2 MG PO TABS
ORAL_TABLET | ORAL | Status: AC
  Filled 2016-07-08: qty 1

## 2016-07-08 MED ORDER — ASPIRIN EC 81 MG PO TBEC
81.0000 mg | DELAYED_RELEASE_TABLET | Freq: Every day | ORAL | Status: DC
  Filled 2016-07-08: qty 1

## 2016-07-08 MED ORDER — ALPRAZOLAM 0.5 MG PO TABS
0.5000 mg | ORAL_TABLET | Freq: Once | ORAL | Status: AC
  Administered 2016-07-08: 0.5 mg via ORAL
  Filled 2016-07-08: qty 1

## 2016-07-08 MED ORDER — ACETAMINOPHEN 650 MG RE SUPP: 650.0000 mg | Freq: Four times a day (QID) | RECTAL | Status: DC | PRN

## 2016-07-08 MED ORDER — CLONIDINE HCL 0.2 MG PO TABS
0.2000 mg | ORAL_TABLET | Freq: Three times a day (TID) | ORAL | Status: DC
  Administered 2016-07-08 – 2016-07-09 (×2): 0.2 mg via ORAL
  Filled 2016-07-08: qty 1

## 2016-07-08 MED ORDER — ONDANSETRON HCL 4 MG/2ML IJ SOLN: 4.0000 mg | Freq: Four times a day (QID) | INTRAMUSCULAR | Status: DC | PRN

## 2016-07-08 MED ORDER — AMIODARONE HCL 200 MG PO TABS
200.0000 mg | ORAL_TABLET | Freq: Every day | ORAL | Status: DC
  Filled 2016-07-08: qty 1

## 2016-07-08 MED ORDER — HYDRALAZINE HCL 25 MG PO TABS
50.0000 mg | ORAL_TABLET | Freq: Once | ORAL | Status: AC
  Administered 2016-07-08: 50 mg via ORAL
  Filled 2016-07-08: qty 1

## 2016-07-08 MED ORDER — NITROGLYCERIN IN D5W 200-5 MCG/ML-% IV SOLN
10.0000 ug/min | INTRAVENOUS | Status: DC
  Administered 2016-07-08: 10 ug/min via INTRAVENOUS
  Filled 2016-07-08: qty 250

## 2016-07-08 MED ORDER — LANTHANUM CARBONATE 500 MG PO CHEW
1000.0000 mg | CHEWABLE_TABLET | Freq: Three times a day (TID) | ORAL | Status: DC
  Filled 2016-07-08 (×6): qty 2

## 2016-07-08 MED ORDER — ONDANSETRON HCL 4 MG/2ML IJ SOLN
4.0000 mg | Freq: Once | INTRAMUSCULAR | Status: AC
  Administered 2016-07-08: 4 mg via INTRAVENOUS
  Filled 2016-07-08: qty 2

## 2016-07-08 MED ORDER — ALBUTEROL SULFATE HFA 108 (90 BASE) MCG/ACT IN AERS: 1.0000 | INHALATION_SPRAY | Freq: Four times a day (QID) | RESPIRATORY_TRACT | Status: DC | PRN

## 2016-07-08 MED ORDER — FLUTICASONE PROPIONATE 50 MCG/ACT NA SUSP
2.0000 | Freq: Every day | NASAL | Status: DC | PRN
  Filled 2016-07-08: qty 16

## 2016-07-08 MED ORDER — PANTOPRAZOLE SODIUM 40 MG PO TBEC
40.0000 mg | DELAYED_RELEASE_TABLET | Freq: Every day | ORAL | Status: DC
  Filled 2016-07-08: qty 1

## 2016-07-08 MED ORDER — HYDRALAZINE HCL 20 MG/ML IJ SOLN
5.0000 mg | Freq: Once | INTRAMUSCULAR | Status: AC
  Administered 2016-07-08: 5 mg via INTRAVENOUS
  Filled 2016-07-08: qty 1

## 2016-07-08 MED ORDER — RENA-VITE PO TABS
1.0000 | ORAL_TABLET | Freq: Every day | ORAL | Status: DC
  Filled 2016-07-08: qty 1

## 2016-07-08 MED ORDER — EPOETIN ALFA 10000 UNIT/ML IJ SOLN
10000.0000 [IU] | INTRAMUSCULAR | Status: DC
  Filled 2016-07-08: qty 1

## 2016-07-08 MED ORDER — HYDRALAZINE HCL 25 MG PO TABS: 50.0000 mg | ORAL_TABLET | Freq: Three times a day (TID) | ORAL | 0 refills | Status: DC | PRN

## 2016-07-08 MED ORDER — ACETAMINOPHEN 325 MG PO TABS
650.0000 mg | ORAL_TABLET | Freq: Once | ORAL | Status: DC
  Filled 2016-07-08: qty 2

## 2016-07-08 MED ORDER — ACETAMINOPHEN 325 MG PO TABS: 650.0000 mg | ORAL_TABLET | Freq: Four times a day (QID) | ORAL | Status: DC | PRN

## 2016-07-08 MED ORDER — CINACALCET HCL 30 MG PO TABS
30.0000 mg | ORAL_TABLET | Freq: Every day | ORAL | Status: DC
  Filled 2016-07-08 (×2): qty 1

## 2016-07-08 NOTE — ED Notes (Signed)
Pt reports recent decrease in HTN meds due to her pulse being low-  She has Dialysis on MWF Also reports a headache currently and wonders if it is her sinuses.   She requests to speak with the doctor who is informed

## 2016-07-08 NOTE — ED Notes (Signed)
CRITICAL VALUE ALERT  Critical value received:  Trop 0.03  Date of notification:  07/08/2016  Time of notification:  2152  Critical value read back:yes  Nurse who received alert:  Tilden Fossa RN  MD notified (1st page):  [place]  Time of first page:  2152  MD notified (2nd page):  Time of second page:  Responding MD:  Mesner  Time MD responded:  2152

## 2016-07-08 NOTE — ED Notes (Signed)
Dr Dolly Rias in to speak regarding IV

## 2016-07-08 NOTE — ED Notes (Signed)
From CT 

## 2016-07-08 NOTE — Telephone Encounter (Signed)
Her nephrologist has made the most recent medication adjustments to her antihypertensive regimen due to her stage 5 CKD. Would have her speak with him.

## 2016-07-08 NOTE — ED Notes (Signed)
Pt to CT

## 2016-07-08 NOTE — Telephone Encounter (Signed)
Pt called c/o BP 229/98 during dialysis today - says she has taken 0.1 mg clonidine - nurse from dialysis suggested she call cardiologist for BP management - pt has appt with Dr. Bronson Ing 07/15/16

## 2016-07-08 NOTE — ED Notes (Signed)
Call to lab re: blood in lab?  They report no blood in lab and will draw

## 2016-07-08 NOTE — ED Notes (Signed)
Admitting physician at bedside

## 2016-07-08 NOTE — H&P (Addendum)
History and Physical    Shawna [name]:[id number] DOB: [birth year] DOA: 07/08/2016  PCP: [name], MD  Patient coming from: Home.  Chief Complaint: Elevated blood pressure.  HPI: Shawna [name] is a 75 y.o. female with history of ESRD on hemodialysis on Monday Wednesday and Friday, hypertension, CAD status post CABG and stenting and cardiac arrest last month in the setting of hyperkalemia was referred to the ER after patient was found to be having persistently elevated blood pressure at the dialysis. Patient did finish her dialysis treatment before coming. In the ER patient did complain of mild headache and shortness of breath. Patient's blood pressure was found to be systolic of 500 with diastolic of 938. Despite giving hydralazine both by mouth and IV blood pressure has not improved. IV nitroglycerin has been started and admitted for hypertensive emergency. Patient during her last admission had labile blood pressures and was discharged on clonidine and when necessary by mouth hydralazine. Patient denies any chest pain. On exam patient appears nonfocal.   ED Course: Hydralazine 50 mg by mouth was given followed by hydralazine 5 mg IV. IV nitroglycerin started. CT head was unremarkable.  Review of Systems: As per HPI, rest all negative.   Past Medical History:  Diagnosis Date  . Anxiety   . Arthritis   . AVM (arteriovenous malformation) of colon   . CAD (coronary artery disease)    a. 12/2011 NSTEMI/Cath/PCI LCX (2.25x14 Resolute DES) & D1 (2.25x22 Resolute DES);  b. 01/2012 Cath/PCI: LM 30, LAD 30p, 40-79m, D1 stent ok, 99 in sm branch of diag, LCX patent stent, OM1 20, RCA 95 ost (4.0x12 Promus DES), EF 55%;  c. 04/2012 Lexi Cardiolite  EF 48%, small area of scar @ base/mid inflat wall with mild peri-infarct ischemia.; CABG 12/4  . Carotid artery disease ([place])    a. 18-29% LICA, 03/3715   . Chronic bronchitis ([place])   . Chronic diastolic CHF (congestive heart failure) ([place])    a.  02/2012 Echo EF 60-65%, nl wall motion, Gr 1 DD, mod MR  . Colon cancer ([place]) 1992  . Esophageal stricture   . ESRD on hemodialysis ([place])    ESRD due to HTN, started dialysis 2011 and gets HD at [facility] with Dr Hinda Lenis on MWF schedule.  Access is LUA AVF as of Sept 2014.   Marland Kitchen GERD (gastroesophageal reflux disease)   . High cholesterol 12/2011  . History of blood transfusion 07/2011; 12/2011; 01/2012 X 2; 04/2012  . History of gout   . History of lower GI bleeding   . Hypertension   . Iron deficiency anemia   . Mitral regurgitation    a. Moderate by echo, 02/2012  . Myocardial infarction   . Ovarian cancer ([place]) 1992  . Pneumonia ~ 2009  . PUD (peptic ulcer disease)   . TIA (transient ischemic attack)     Past Surgical History:  Procedure Laterality Date  . ABDOMINAL HYSTERECTOMY  1992  . APPENDECTOMY  06/1990  . AV FISTULA PLACEMENT  07/2009   left upper arm  . COLON RESECTION  1992  . CORONARY ANGIOPLASTY WITH STENT PLACEMENT  12/15/11   "2"  . CORONARY ANGIOPLASTY WITH STENT PLACEMENT  y/2013   "1; makes total of 3" (05/02/2012)  . CORONARY ARTERY BYPASS GRAFT  06/13/2012   Procedure: CORONARY ARTERY BYPASS GRAFTING (CABG);  Surgeon: Grace Isaac, MD;  Location: [facility];  Service: Open Heart Surgery;  Laterality: N/A;  cabg x four;  using left internal mammary artery, and left leg greater saphenous vein harvested endoscopically  . DILATION AND CURETTAGE OF UTERUS    . ESOPHAGOGASTRODUODENOSCOPY  01/20/2012   Procedure: ESOPHAGOGASTRODUODENOSCOPY (EGD);  Surgeon: Ladene Artist, MD,FACG;  Location: [facility] ENDOSCOPY;  Service: Endoscopy;  Laterality: N/A;  . ESOPHAGOGASTRODUODENOSCOPY N/A 03/26/2013   Procedure: ESOPHAGOGASTRODUODENOSCOPY (EGD);  Surgeon: Irene Shipper, MD;  Location: [facility] ENDOSCOPY;  Service: Endoscopy;  Laterality: N/A;  . ESOPHAGOGASTRODUODENOSCOPY N/A 04/30/2015   Procedure: ESOPHAGOGASTRODUODENOSCOPY (EGD);  Surgeon: Rogene Houston, MD;  [facility];   Service: Endoscopy;  Laterality: N/A;  1pm - moved to 10/20 @ 1:10  . INTRAOPERATIVE TRANSESOPHAGEAL ECHOCARDIOGRAM  06/13/2012   Procedure: INTRAOPERATIVE TRANSESOPHAGEAL ECHOCARDIOGRAM;  Surgeon: Grace Isaac, MD;  Location: [facility];  Service: Open Heart Surgery;  Laterality: N/A;  . LEFT HEART CATHETERIZATION WITH CORONARY ANGIOGRAM N/A 12/15/2011   Procedure: LEFT HEART CATHETERIZATION WITH CORONARY ANGIOGRAM;  Surgeon: Burnell Blanks, MD;  Location: [facility] CATH LAB;  Service: Cardiovascular;  Laterality: N/A;  . LEFT HEART CATHETERIZATION WITH CORONARY ANGIOGRAM N/A 01/10/2012   Procedure: LEFT HEART CATHETERIZATION WITH CORONARY ANGIOGRAM;  Surgeon: Peter M Martinique, MD;  Location: [facility] CATH LAB;  Service: Cardiovascular;  Laterality: N/A;  . LEFT HEART CATHETERIZATION WITH CORONARY ANGIOGRAM N/A 06/08/2012   Procedure: LEFT HEART CATHETERIZATION WITH CORONARY ANGIOGRAM;  Surgeon: Burnell Blanks, MD;  Location: [facility] C[facility] LAB;  Service: Cardiovascular;  Laterality: N/A;  . LEFT HEART CATHETERIZATION WITH CORONARY/GRAFT ANGIOGRAM N/A 12/10/2013   Procedure: LEFT HEART CATHETERIZATION WITH Beatrix Fetters;  Surgeon: [name], MD;  Location: [facility] CATH LAB;  Service: Cardiovascular;  Laterality: N/A;  . OVARY SURGERY     ovarian cancer  . SHUNTOGRAM N/A 10/15/2013   Procedure: Fistulogram;  Surgeon: Serafina Mitchell, MD;  Location: [facility] CATH LAB;  Service: Cardiovascular;  Laterality: N/A;  . THROMBECTOMY / ARTERIOVENOUS GRAFT REVISION  2011   left upper arm  . TUBAL LIGATION  1980's     reports that she has never smoked. She has never used smokeless tobacco. She reports that she does not drink alcohol or use drugs.  Allergies - penicillin, Bactrim, amlodipine, Plavix, morphine.  Family History  Problem Relation Age of Onset  . Heart disease Mother     Heart Disease before age 3  . Hyperlipidemia Mother   . Hypertension Mother   . Diabetes Mother   . Heart attack  Mother   . Heart disease Father     Heart Disease before age 22  . Hyperlipidemia Father   . Hypertension Father   . Diabetes Father   . Diabetes Sister   . Hypertension Sister   . Diabetes Brother   . Hyperlipidemia Brother   . Heart attack Brother   . Hypertension Sister   . Heart attack Brother   . Other      noncontributory for early CAD  . Colon cancer Neg Hx   . Esophageal cancer Neg Hx   . Liver disease Neg Hx   . Kidney disease Neg Hx   . Colon polyps Neg Hx     Prior to Admission medications   Medication Sig Start Date End Date Taking? Authorizing Provider  ALPRAZolam Duanne Moron) 0.25 MG tablet Take 1-2 tablets by mouth at bedtime as needed for sleep.  11/19/15  Yes Historical Provider, MD  aspirin EC 81 MG tablet Take 81 mg by mouth daily.    Yes Historical Provider, MD  cloNIDine (CATAPRES) 0.1 MG  tablet Take 1 tablet (0.1 mg total) by mouth 2 (two) times daily. 04/08/16  Yes Orvan Falconer, MD  epoetin alfa (EPOGEN,PROCRIT) 10272 UNIT/ML injection Inject 1 mL (10,000 Units total) into the vein every Monday, Wednesday, and Friday with hemodialysis. 04/11/16  Yes Orvan Falconer, MD  fluticasone (FLONASE) 50 MCG/ACT nasal spray Place 2 sprays into the nose daily as needed for allergies.    Yes Historical Provider, MD  lanthanum (FOSRENOL) 1000 MG chewable tablet Chew 1 tablet (1,000 mg total) by mouth 3 (three) times daily with meals. 04/08/16  Yes Orvan Falconer, MD  multivitamin (RENA-VIT) TABS tablet Take 1 tablet by mouth daily.   Yes Historical Provider, MD  nitroGLYCERIN (NITROSTAT) 0.4 MG SL tablet Place 1 tablet (0.4 mg total) under the tongue every 5 (five) minutes as needed for chest pain. 04/13/16 07/12/16 Yes Herminio Commons, MD  omeprazole (PRILOSEC) 20 MG capsule TAKE ONE CAPSULE BY MOUTH ONCE DAILY 04/15/16  Yes Butch Penny, NP  PROAIR HFA 108 (90 BASE) MCG/ACT inhaler Inhale 1 puff into the lungs every 6 (six) hours as needed for wheezing or shortness of breath.  01/09/15  Yes  Historical Provider, MD  SENSIPAR 30 MG tablet Take 30 mg by mouth daily with supper.  12/21/12  Yes Historical Provider, MD  simvastatin (ZOCOR) 20 MG tablet Take 1 tablet (20 mg total) by mouth at bedtime. 04/04/16  Yes Lendon Colonel, NP  amiodarone (PACERONE) 200 MG tablet Take 1 tablet (200 mg total) by mouth daily. Patient not taking: Reported on 07/08/2016 09/23/15   Herminio Commons, MD  hydrALAZINE (APRESOLINE) 25 MG tablet Take 2 tablets (50 mg total) by mouth 3 (three) times daily as needed (systolic BP > 536 or Diastolic > 644). 07/08/16   Merrily Pew, MD    Physical Exam: Vitals:   07/08/16 2142 07/08/16 2143 07/08/16 2200 07/08/16 2300  BP: (!) 221/95 (!) 221/95 (!) 212/80 (!) 229/105  Pulse:  80 80 93  Resp:  20 17 24   Temp:      TempSrc:      SpO2:  95% 96% 96%  Weight:      Height:          Constitutional: Moderately built and nourished. Vitals:   07/08/16 2142 07/08/16 2143 07/08/16 2200 07/08/16 2300  BP: (!) 221/95 (!) 221/95 (!) 212/80 (!) 229/105  Pulse:  80 80 93  Resp:  20 17 24   Temp:      TempSrc:      SpO2:  95% 96% 96%  Weight:      Height:       Eyes: Anicteric no pallor. ENMT: No discharge from the ears eyes nose or mouth. Neck: No mass felt. No neck rigidity. Respiratory: No rhonchi or crepitations. Cardiovascular: S1-S2 heard and no murmurs appreciated. Abdomen: Soft nontender bowel sounds present. No guarding or rigidity. Musculoskeletal: No edema. No joint effusion. Skin: No rash. Skin appears warm. Neurologic: Alert awake oriented to time place and person. Moves all extremities. Psychiatric: Appears normal. Normal affect.   Labs on Admission: I have personally reviewed following labs and imaging studies  CBC:  Recent Labs Lab 07/08/16 2007  WBC 5.4  NEUTROABS 3.4  HGB 11.0*  HCT 35.0*  MCV 103.2*  PLT 034   Basic Metabolic Panel:  Recent Labs Lab 07/08/16 2007  NA 133*  K 3.3*  CL 94*  CO2 31  GLUCOSE 104*    BUN 21*  CREATININE 4.81*  CALCIUM 8.4*  GFR: Estimated Creatinine Clearance: 7.5 mL/min (by C-G formula based on SCr of 4.81 mg/dL (H)). Liver Function Tests: No results for input(s): AST, ALT, ALKPHOS, BILITOT, PROT, ALBUMIN in the last 168 hours. No results for input(s): LIPASE, AMYLASE in the last 168 hours. No results for input(s): AMMONIA in the last 168 hours. Coagulation Profile: No results for input(s): INR, PROTIME in the last 168 hours. Cardiac Enzymes:  Recent Labs Lab 07/08/16 2007  TROPONINI 0.03*   BNP (last 3 results) No results for input(s): PROBNP in the last 8760 hours. HbA1C: No results for input(s): HGBA1C in the last 72 hours. CBG: No results for input(s): GLUCAP in the last 168 hours. Lipid Profile: No results for input(s): CHOL, HDL, LDLCALC, TRIG, CHOLHDL, LDLDIRECT in the last 72 hours. Thyroid Function Tests: No results for input(s): TSH, T4TOTAL, FREET4, T3FREE, THYROIDAB in the last 72 hours. Anemia Panel: No results for input(s): VITAMINB12, FOLATE, FERRITIN, TIBC, IRON, RETICCTPCT in the last 72 hours. Urine analysis:    Component Value Date/Time   COLORURINE YELLOW 12/16/2012 1919   APPEARANCEUR CLOUDY (A) 12/16/2012 1919   LABSPEC 1.009 12/16/2012 1919   PHURINE 7.5 12/16/2012 1919   GLUCOSEU NEGATIVE 12/16/2012 1919   HGBUR TRACE (A) 12/16/2012 1919   BILIRUBINUR NEGATIVE 12/16/2012 1919   KETONESUR NEGATIVE 12/16/2012 1919   PROTEINUR 100 (A) 12/16/2012 1919   UROBILINOGEN 0.2 12/16/2012 1919   NITRITE NEGATIVE 12/16/2012 1919   LEUKOCYTESUR SMALL (A) 12/16/2012 1919   Sepsis Labs: @LABRCNTIP (procalcitonin:4,lacticidven:4) )No results found for this or any previous visit (from the past 240 hour(s)).   Radiological Exams on Admission: Ct Head Wo Contrast  Result Date: 07/08/2016 CLINICAL DATA:  Elevated blood pressure after dialysis currently with headache. No known injury. EXAM: CT HEAD WITHOUT CONTRAST TECHNIQUE: Contiguous  axial images were obtained from the base of the skull through the vertex without intravenous contrast. COMPARISON:  Head CTs from 05/19/2016 and 03/09/2016 FINDINGS: Brain: Chronic mild cerebral atrophy without ventricular dilatation. Chronic small vessel ischemic disease of the white matter noted bilaterally with chronic left lacunar infarct in the left putamen/external capsule. No acute intracranial hemorrhage, midline shift or mass. No extra-axial fluid collection. No effacement of the basal cisterns. Vascular: Vascular calcifications in the intracranial carotid arteries and both vertebral arteries. Skull: No acute osseous abnormality. Sinuses/Orbits: Mucous retention cyst in the sphenoid. Orbits are nonsuspicious mastoids appear clear. Other: None IMPRESSION: Chronic mild cerebral atrophy with small vessel ischemic disease of periventricular white matter. Chronic left putamenal/external capsule lacunar infarct. No acute intracranial abnormality noted. Electronically Signed   By: Ashley Royalty M.D.   On: 07/08/2016 21:14     Assessment/Plan Principal Problem:   Hypertensive emergency Active Problems:   ESRD on hemodialysis ([place])   Hx of CABG   Chronic diastolic CHF (congestive heart failure) ([place])    1. Hypertensive emergency - patient has been placed on IV nitroglycerin infusion. Patient usually takes clonidine 0.1 mg twice daily which I have increased dose to 0.2 mg every 8 hourly. Since patient has labile blood pressure closely follow blood pressure trends. Patient's headache is improved after Tylenol. He is nonfocal. Since patient had mild shortness of breath on arrival chest x-ray has been ordered which is pending. 2. ESRD on hemodialysis on Monday Wednesday and Friday - patient has had dialysis today.". 3. History of CAD status post CABG and stenting - on aspirin and statins. Which will be continued. Cycle cardiac markers due to shortness of breath. 4. History of PSVT -  on  amiodarone. 5. Chronic anemia secondary to ESRD - follow CBC. Patient is on iron supplements. 6. Admitted last month with cardiac arrest in the setting of hyperkalemia. 7. Chronic diastolic CHF with last EF measured in March 2017 or 60-65% with grade 2 diastolic dysfunction.  Addendum - patient had a brief episode of V. tach in the ER. I have ordered EKG which shows normal sinus rhythm with LVH and QTC of 511 ms. Magnesium level was 1.5 for which I have ordered 2 g IV dose. We will trend cardiac markers. Chest x-ray shows possible infiltrates and I have placed patient on empiric antibiotics.   DVT prophylaxis: SCDs until blood pressure improves. Code Status: Full code.  Family Communication: Patient's husband.  Disposition Plan: Home.  Consults called: None.  Admission status: Observation.    Rise Patience MD [facility] Pager [phone].  If 7PM-7AM, please contact night-coverage [email or web address] Password [email or web address]  07/08/2016, 11:40 PM

## 2016-07-08 NOTE — ED Triage Notes (Signed)
Pt had dialysis this morning. Blood pressure elevated on discharge. States MD changed her medication last week.

## 2016-07-08 NOTE — ED Provider Notes (Addendum)
[facility] DEPT Provider Note   CSN: [id number] Arrival date & time: 07/08/16  1448     History   Chief Complaint Chief Complaint  Patient presents with  . Hypertension    HPI Shawna [name] is a 75 y.o. female.   Hypertension  This is a chronic problem. The current episode started more than 1 week ago. The problem occurs constantly. The problem has been gradually worsening. Pertinent negatives include no chest pain, no abdominal pain, no headaches and no shortness of breath. Nothing aggravates the symptoms. Nothing relieves the symptoms. Treatments tried: clonidine. The treatment provided no relief.    Past Medical History:  Diagnosis Date  . Anxiety   . Arthritis   . AVM (arteriovenous malformation) of colon   . CAD (coronary artery disease)    a. 12/2011 NSTEMI/Cath/PCI LCX (2.25x14 Resolute DES) & D1 (2.25x22 Resolute DES);  b. 01/2012 Cath/PCI: LM 30, LAD 30p, 40-67m, D1 stent ok, 99 in sm branch of diag, LCX patent stent, OM1 20, RCA 95 ost (4.0x12 Promus DES), EF 55%;  c. 04/2012 Lexi Cardiolite  EF 48%, small area of scar @ base/mid inflat wall with mild peri-infarct ischemia.; CABG 12/4  . Carotid artery disease ([place])    a. 01-75% LICA, 07/256   . Chronic bronchitis ([place])   . Chronic diastolic CHF (congestive heart failure) ([place])    a. 02/2012 Echo EF 60-65%, nl wall motion, Gr 1 DD, mod MR  . Colon cancer ([place]) 1992  . Esophageal stricture   . ESRD on hemodialysis ([place])    ESRD due to HTN, started dialysis 2011 and gets HD at  [facility] with Dr Hinda Lenis on MWF schedule.  Access is LUA AVF as of Sept 2014.   Marland Kitchen GERD (gastroesophageal reflux disease)   . High cholesterol 12/2011  . History of blood transfusion 07/2011; 12/2011; 01/2012 X 2; 04/2012  . History of gout   . History of lower GI bleeding   . Hypertension   . Iron deficiency anemia   . Mitral regurgitation    a. Moderate by echo, 02/2012  . Myocardial infarction   . Ovarian cancer ([place]) 1992  .  Pneumonia ~ 2009  . PUD (peptic ulcer disease)   . TIA (transient ischemic attack)     Patient Active Problem List   Diagnosis Date Noted  . Hypertensive emergency 07/08/2016  . Respiratory failure ([place])   . Cardiac arrest ([place])   . Palliative care encounter   . DNR (do not resuscitate) discussion   . Goals of care, counseling/discussion   . Hypertensive crisis without congestive heart failure 05/09/2016  . Acute pulmonary edema ([place]) 04/06/2016  . Acute respiratory failure ([place]) 04/06/2016  . Hypertensive crisis 01/27/2016  . History of colon cancer 01/27/2016  . History of ovarian cancer 01/27/2016  . Hypertensive urgency 01/27/2016  . Narrow complex tachycardia ([place]) 09/08/2015  . SVT (supraventricular tachycardia) ([place]) 09/08/2015  . Influenza A 08/30/2015  . Acute on chronic diastolic CHF (congestive heart failure) ([place]) 05/04/2015  . Unstable angina ([place]) 05/03/2015  . Essential hypertension   . Pain in joint, lower leg 08/14/2014  . Chest pain 11/26/2013  . Small bowel obstruction, partial 05/29/2013  . Chronic diastolic CHF (congestive heart failure) ([place]) 03/22/2013  . GI bleeding 03/21/2013  . Acute blood loss anemia 03/21/2013  . Vaginal odor 03/12/2013  . Vaginal discharge 03/12/2013  . Occlusion and stenosis of carotid artery without mention of cerebral infarction 01/24/2013  . Hx of CABG 07/05/2012  .  Carotid artery disease ([place]) 07/05/2012  . Anemia of chronic kidney failure 07/03/2012  . Secondary hyperparathyroidism ([place]) 07/03/2012  . Mitral regurgitation 06/12/2012  . Pneumonia 06/09/2012  . Non-STEMI (non-ST elevated myocardial infarction) ([place]) 06/08/2012  . GERD (gastroesophageal reflux disease) 01/09/2012  . HLD (hyperlipidemia) 01/05/2012  . Coronary atherosclerosis of native coronary artery 12/16/2011  . ESRD on hemodialysis ([place]) 12/16/2011    Past Surgical History:  Procedure Laterality Date  . ABDOMINAL HYSTERECTOMY  1992  . APPENDECTOMY   06/1990  . AV FISTULA PLACEMENT  07/2009   left upper arm  . COLON RESECTION  1992  . CORONARY ANGIOPLASTY WITH STENT PLACEMENT  12/15/11   "2"  . CORONARY ANGIOPLASTY WITH STENT PLACEMENT  y/2013   "1; makes total of 3" (05/02/2012)  . CORONARY ARTERY BYPASS GRAFT  06/13/2012   Procedure: CORONARY ARTERY BYPASS GRAFTING (CABG);  Surgeon: Grace Isaac, MD;  Location: [facility];  Service: Open Heart Surgery;  Laterality: N/A;  cabg x four;  using left internal mammary artery, and left leg greater saphenous vein harvested endoscopically  . DILATION AND CURETTAGE OF UTERUS    . ESOPHAGOGASTRODUODENOSCOPY  01/20/2012   Procedure: ESOPHAGOGASTRODUODENOSCOPY (EGD);  Surgeon: Ladene Artist, MD,FACG;  Location: [facility] ENDOSCOPY;  Service: Endoscopy;  Laterality: N/A;  . ESOPHAGOGASTRODUODENOSCOPY N/A 03/26/2013   Procedure: ESOPHAGOGASTRODUODENOSCOPY (EGD);  Surgeon: Irene Shipper, MD;  Location: [facility] ENDOSCOPY;  Service: Endoscopy;  Laterality: N/A;  . ESOPHAGOGASTRODUODENOSCOPY N/A 04/30/2015   Procedure: ESOPHAGOGASTRODUODENOSCOPY (EGD);  Surgeon: Rogene Houston, MD;  Location: [facility];  Service: Endoscopy;  Laterality: N/A;  1pm - moved to 10/20 @ 1:10  . INTRAOPERATIVE TRANSESOPHAGEAL ECHOCARDIOGRAM  06/13/2012   Procedure: INTRAOPERATIVE TRANSESOPHAGEAL ECHOCARDIOGRAM;  Surgeon: Grace Isaac, MD;  Location: [facility];  Service: Open Heart Surgery;  Laterality: N/A;  . LEFT HEART CATHETERIZATION WITH CORONARY ANGIOGRAM N/A 12/15/2011   Procedure: LEFT HEART CATHETERIZATION WITH CORONARY ANGIOGRAM;  Surgeon: Burnell Blanks, MD;  Location: [facility] CATH LAB;  Service: Cardiovascular;  Laterality: N/A;  . LEFT HEART CATHETERIZATION WITH CORONARY ANGIOGRAM N/A 01/10/2012   Procedure: LEFT HEART CATHETERIZATION WITH CORONARY ANGIOGRAM;  Surgeon: Peter M Martinique, MD;  Location: [facility] CATH LAB;  Service: Cardiovascular;  Laterality: N/A;  . LEFT HEART CATHETERIZATION WITH CORONARY ANGIOGRAM N/A 06/08/2012    Procedure: LEFT HEART CATHETERIZATION WITH CORONARY ANGIOGRAM;  Surgeon: Burnell Blanks, MD;  Location: [facility] CATH LAB;  Service: Cardiovascular;  Laterality: N/A;  . LEFT HEART CATHETERIZATION WITH CORONARY/GRAFT ANGIOGRAM N/A 12/10/2013   Procedure: LEFT HEART CATHETERIZATION WITH Beatrix Fetters;  Surgeon: [name], MD;  Location: [facility] CATH LAB;  Service: Cardiovascular;  Laterality: N/A;  . OVARY SURGERY     ovarian cancer  . SHUNTOGRAM N/A 10/15/2013   Procedure: Fistulogram;  Surgeon: Serafina Mitchell, MD;  Location: [facility] CATH LAB;  Service: Cardiovascular;  Laterality: N/A;  . THROMBECTOMY / ARTERIOVENOUS GRAFT REVISION  2011   left upper arm  . TUBAL LIGATION  1980's    OB History    Gravida Para Term Preterm AB Living   2 2   2   2    SAB TAB Ectopic Multiple Live Births                   Home Medications    Prior to Admission medications   Medication Sig Start Date End Date Taking? Authorizing Provider  ALPRAZolam Duanne Moron) 0.25 MG tablet Take 1-2 tablets by mouth at bedtime as needed for  sleep.  11/19/15  Yes Historical Provider, MD  aspirin EC 81 MG tablet Take 81 mg by mouth daily.    Yes Historical Provider, MD  cloNIDine (CATAPRES) 0.1 MG tablet Take 1 tablet (0.1 mg total) by mouth 2 (two) times daily. 04/08/16  Yes Orvan Falconer, MD  epoetin alfa (EPOGEN,PROCRIT) 37858 UNIT/ML injection Inject 1 mL (10,000 Units total) into the vein every Monday, Wednesday, and Friday with hemodialysis. 04/11/16  Yes Orvan Falconer, MD  fluticasone (FLONASE) 50 MCG/ACT nasal spray Place 2 sprays into the nose daily as needed for allergies.    Yes Historical Provider, MD  lanthanum (FOSRENOL) 1000 MG chewable tablet Chew 1 tablet (1,000 mg total) by mouth 3 (three) times daily with meals. 04/08/16  Yes Orvan Falconer, MD  multivitamin (RENA-VIT) TABS tablet Take 1 tablet by mouth daily.   Yes Historical Provider, MD  nitroGLYCERIN (NITROSTAT) 0.4 MG SL tablet Place 1 tablet (0.4 mg total)  under the tongue every 5 (five) minutes as needed for chest pain. 04/13/16 07/12/16 Yes Herminio Commons, MD  omeprazole (PRILOSEC) 20 MG capsule TAKE ONE CAPSULE BY MOUTH ONCE DAILY 04/15/16  Yes Butch Penny, NP  PROAIR HFA 108 (90 BASE) MCG/ACT inhaler Inhale 1 puff into the lungs every 6 (six) hours as needed for wheezing or shortness of breath.  01/09/15  Yes Historical Provider, MD  SENSIPAR 30 MG tablet Take 30 mg by mouth daily with supper.  12/21/12  Yes Historical Provider, MD  simvastatin (ZOCOR) 20 MG tablet Take 1 tablet (20 mg total) by mouth at bedtime. 04/04/16  Yes Lendon Colonel, NP  amiodarone (PACERONE) 200 MG tablet Take 1 tablet (200 mg total) by mouth daily. Patient not taking: Reported on 07/08/2016 09/23/15   Herminio Commons, MD  hydrALAZINE (APRESOLINE) 25 MG tablet Take 2 tablets (50 mg total) by mouth 3 (three) times daily as needed (systolic BP > 850 or Diastolic > 277). 07/08/16   Merrily Pew, MD    Family History Family History  Problem Relation Age of Onset  . Heart disease Mother     Heart Disease before age 49  . Hyperlipidemia Mother   . Hypertension Mother   . Diabetes Mother   . Heart attack Mother   . Heart disease Father     Heart Disease before age 18  . Hyperlipidemia Father   . Hypertension Father   . Diabetes Father   . Diabetes Sister   . Hypertension Sister   . Diabetes Brother   . Hyperlipidemia Brother   . Heart attack Brother   . Hypertension Sister   . Heart attack Brother   . Other      noncontributory for early CAD  . Colon cancer Neg Hx   . Esophageal cancer Neg Hx   . Liver disease Neg Hx   . Kidney disease Neg Hx   . Colon polyps Neg Hx     Social History Social History  Substance Use Topics  . Smoking status: Never Smoker  . Smokeless tobacco: Never Used  . Alcohol use No     Allergies   Aspirin; Contrast media [iodinated diagnostic agents]; Iron; Penicillins; Plavix [clopidogrel bisulfate]; Amlodipine;  Bactrim [sulfamethoxazole-trimethoprim]; Dexilant [dexlansoprazole]; Morphine and related; Nitrofurantoin; Prilosec [omeprazole]; Venofer [ferric oxide]; Tylenol [acetaminophen]; Levaquin [levofloxacin in d5w]; and Protonix [pantoprazole sodium]   Review of Systems Review of Systems  Respiratory: Negative for shortness of breath.   Cardiovascular: Negative for chest pain.  Gastrointestinal: Negative for abdominal pain.  Neurological: Negative for headaches.  All other systems reviewed and are negative.    Physical Exam Updated Vital Signs BP 186/81   Pulse 93   Temp 98.1 F (36.7 C) (Oral)   Resp 21   Ht 5\' 1"  (1.549 m)   Wt 125 lb (56.7 kg)   SpO2 96%   BMI 23.62 kg/m   Physical Exam  Constitutional: She is oriented to person, place, and time. She appears well-developed and well-nourished.  HENT:  Head: Normocephalic and atraumatic.  Eyes: Conjunctivae and EOM are normal.  Neck: Normal range of motion.  Cardiovascular: Normal rate and regular rhythm.  Exam reveals no gallop and no friction rub.   No murmur heard. Pulmonary/Chest: No stridor. No respiratory distress.  Abdominal: She exhibits no distension.  Neurological: She is alert and oriented to person, place, and time. No cranial nerve deficit. Coordination normal.  Skin: Skin is warm and dry.  Psychiatric: Her mood appears anxious. Her speech is not rapid and/or pressured, not delayed and not slurred.  Nursing note and vitals reviewed.    ED Treatments / Results  Labs (all labs ordered are listed, but only abnormal results are displayed) Labs Reviewed  CBC WITH DIFFERENTIAL/PLATELET - Abnormal; Notable for the following:       Result Value   RBC 3.39 (*)    Hemoglobin 11.0 (*)    HCT 35.0 (*)    MCV 103.2 (*)    RDW 18.0 (*)    All other components within normal limits  BASIC METABOLIC PANEL - Abnormal; Notable for the following:    Sodium 133 (*)    Potassium 3.3 (*)    Chloride 94 (*)    Glucose, Bld  104 (*)    BUN 21 (*)    Creatinine, Ser 4.81 (*)    Calcium 8.4 (*)    GFR calc non Af Amer 8 (*)    GFR calc Af Amer 9 (*)    All other components within normal limits  TROPONIN I - Abnormal; Notable for the following:    Troponin I 0.03 (*)    All other components within normal limits  TROPONIN I - Abnormal; Notable for the following:    Troponin I 0.04 (*)    All other components within normal limits  TROPONIN I  TROPONIN I  COMPREHENSIVE METABOLIC PANEL  CBC WITH DIFFERENTIAL/PLATELET    EKG  EKG Interpretation None       Radiology Ct Head Wo Contrast  Result Date: 07/08/2016 CLINICAL DATA:  Elevated blood pressure after dialysis currently with headache. No known injury. EXAM: CT HEAD WITHOUT CONTRAST TECHNIQUE: Contiguous axial images were obtained from the base of the skull through the vertex without intravenous contrast. COMPARISON:  Head CTs from 05/19/2016 and 03/09/2016 FINDINGS: Brain: Chronic mild cerebral atrophy without ventricular dilatation. Chronic small vessel ischemic disease of the white matter noted bilaterally with chronic left lacunar infarct in the left putamen/external capsule. No acute intracranial hemorrhage, midline shift or mass. No extra-axial fluid collection. No effacement of the basal cisterns. Vascular: Vascular calcifications in the intracranial carotid arteries and both vertebral arteries. Skull: No acute osseous abnormality. Sinuses/Orbits: Mucous retention cyst in the sphenoid. Orbits are nonsuspicious mastoids appear clear. Other: None IMPRESSION: Chronic mild cerebral atrophy with small vessel ischemic disease of periventricular white matter. Chronic left putamenal/external capsule lacunar infarct. No acute intracranial abnormality noted. Electronically Signed   By: Ashley Royalty M.D.   On: 07/08/2016 21:14    Procedures Procedures (including  critical care time)  Medications Ordered in ED Medications  nitroGLYCERIN 50 mg in dextrose 5 % 250  mL (0.2 mg/mL) infusion (10 mcg/min Intravenous New Bag/Given 07/08/16 2256)  cloNIDine (CATAPRES) tablet 0.2 mg (0.2 mg Oral Given 07/08/16 2320)  pantoprazole (PROTONIX) EC tablet 40 mg (not administered)  epoetin alfa (EPOGEN,PROCRIT) injection 10,000 Units (not administered)  lanthanum (FOSRENOL) chewable tablet 1,000 mg (not administered)  multivitamin (RENA-VIT) tablet 1 tablet (not administered)  simvastatin (ZOCOR) tablet 20 mg (20 mg Oral Given 07/09/16 0029)  ALPRAZolam (XANAX) tablet 0.25-0.5 mg (not administered)  amiodarone (PACERONE) tablet 200 mg (not administered)  albuterol (PROVENTIL HFA;VENTOLIN HFA) 108 (90 Base) MCG/ACT inhaler 1 puff (not administered)  aspirin EC tablet 81 mg (not administered)  cinacalcet (SENSIPAR) tablet 30 mg (not administered)  fluticasone (FLONASE) 50 MCG/ACT nasal spray 2 spray (not administered)  acetaminophen (TYLENOL) tablet 650 mg (not administered)    Or  acetaminophen (TYLENOL) suppository 650 mg (not administered)  ondansetron (ZOFRAN) tablet 4 mg (not administered)    Or  ondansetron (ZOFRAN) injection 4 mg (not administered)  simvastatin (ZOCOR) 10 MG tablet (not administered)  hydrALAZINE (APRESOLINE) tablet 50 mg (50 mg Oral Given 07/08/16 1636)  ALPRAZolam (XANAX) tablet 0.5 mg (0.5 mg Oral Given 07/08/16 1636)  hydrALAZINE (APRESOLINE) injection 5 mg (5 mg Intravenous Given 07/08/16 2142)  ondansetron (ZOFRAN) injection 4 mg (4 mg Intravenous Given 07/08/16 2214)     Initial Impression / Assessment and Plan / ED Course  I have reviewed the triage vital signs and the nursing notes.  Pertinent labs & imaging results that were available during my care of the patient were reviewed by me and considered in my medical decision making (see chart for details).  Clinical Course    Here with asymptomatic HTN, however the systolic is markedly high. I did review her recent hospitalization appears her medications were changed secondary  to PA arrest from bradycardia that was thought to be likely related to medications. I will avoid beta blockers. She is supposed to be taking when necessary hydralazine which she did not try home so we'll try that and give her one of her anxiety medication and she does appear visibly anxious. She is asymptomatic so any reduction in her blood pressure will be an improvement for me. Is no physical signs of end organ damage at this time other.  BP continued to improve but just prior to discharge it increased back to 794'I systolic again and patient complaining of headache. CT negative. IV hydralazine didn't help. Discussed with hospitalist and will start NTG infusion and admit to stepdown for htn crisis.   Final Clinical Impressions(s) / ED Diagnoses   Final diagnoses:  Secondary hypertension      Merrily Pew, MD 07/09/16 [phone]

## 2016-07-08 NOTE — Telephone Encounter (Signed)
Pt aware - says her nephrologist suggesting she go to ED for further evaluation - pt says she will follow his recs

## 2016-07-08 NOTE — ED Notes (Signed)
Pt has no IV established, has multiple sites that have been unsuccessful-  Dr Jerilynn Mages in to speak with pt

## 2016-07-08 NOTE — ED Notes (Signed)
Pt took 2 doses of her BP medication Clonidine 0.1mg  at 1400 today at HD.  C/o headache, rating pain 5-6/10.

## 2016-07-08 NOTE — ED Notes (Signed)
Lab in to draw

## 2016-07-09 ENCOUNTER — Observation Stay (HOSPITAL_COMMUNITY): Payer: Medicare HMO

## 2016-07-09 DIAGNOSIS — M899 Disorder of bone, unspecified: Secondary | ICD-10-CM | POA: Diagnosis present

## 2016-07-09 DIAGNOSIS — Z8701 Personal history of pneumonia (recurrent): Secondary | ICD-10-CM | POA: Diagnosis not present

## 2016-07-09 DIAGNOSIS — Z8674 Personal history of sudden cardiac arrest: Secondary | ICD-10-CM | POA: Diagnosis not present

## 2016-07-09 DIAGNOSIS — I472 Ventricular tachycardia: Secondary | ICD-10-CM | POA: Diagnosis not present

## 2016-07-09 DIAGNOSIS — D631 Anemia in chronic kidney disease: Secondary | ICD-10-CM | POA: Diagnosis not present

## 2016-07-09 DIAGNOSIS — Z8543 Personal history of malignant neoplasm of ovary: Secondary | ICD-10-CM | POA: Diagnosis not present

## 2016-07-09 DIAGNOSIS — I161 Hypertensive emergency: Secondary | ICD-10-CM | POA: Diagnosis not present

## 2016-07-09 DIAGNOSIS — F419 Anxiety disorder, unspecified: Secondary | ICD-10-CM | POA: Diagnosis present

## 2016-07-09 DIAGNOSIS — Z992 Dependence on renal dialysis: Secondary | ICD-10-CM | POA: Diagnosis not present

## 2016-07-09 DIAGNOSIS — J9811 Atelectasis: Secondary | ICD-10-CM | POA: Diagnosis not present

## 2016-07-09 DIAGNOSIS — K219 Gastro-esophageal reflux disease without esophagitis: Secondary | ICD-10-CM | POA: Diagnosis present

## 2016-07-09 DIAGNOSIS — I252 Old myocardial infarction: Secondary | ICD-10-CM | POA: Diagnosis not present

## 2016-07-09 DIAGNOSIS — J189 Pneumonia, unspecified organism: Secondary | ICD-10-CM | POA: Diagnosis not present

## 2016-07-09 DIAGNOSIS — Z85038 Personal history of other malignant neoplasm of large intestine: Secondary | ICD-10-CM | POA: Diagnosis not present

## 2016-07-09 DIAGNOSIS — R05 Cough: Secondary | ICD-10-CM | POA: Diagnosis not present

## 2016-07-09 DIAGNOSIS — I132 Hypertensive heart and chronic kidney disease with heart failure and with stage 5 chronic kidney disease, or end stage renal disease: Secondary | ICD-10-CM | POA: Diagnosis not present

## 2016-07-09 DIAGNOSIS — E7800 Pure hypercholesterolemia, unspecified: Secondary | ICD-10-CM | POA: Diagnosis present

## 2016-07-09 DIAGNOSIS — I10 Essential (primary) hypertension: Secondary | ICD-10-CM | POA: Diagnosis not present

## 2016-07-09 DIAGNOSIS — Z8673 Personal history of transient ischemic attack (TIA), and cerebral infarction without residual deficits: Secondary | ICD-10-CM | POA: Diagnosis not present

## 2016-07-09 DIAGNOSIS — N186 End stage renal disease: Secondary | ICD-10-CM | POA: Diagnosis not present

## 2016-07-09 DIAGNOSIS — N2581 Secondary hyperparathyroidism of renal origin: Secondary | ICD-10-CM | POA: Diagnosis not present

## 2016-07-09 DIAGNOSIS — I340 Nonrheumatic mitral (valve) insufficiency: Secondary | ICD-10-CM | POA: Diagnosis present

## 2016-07-09 DIAGNOSIS — J90 Pleural effusion, not elsewhere classified: Secondary | ICD-10-CM | POA: Diagnosis not present

## 2016-07-09 DIAGNOSIS — I5032 Chronic diastolic (congestive) heart failure: Secondary | ICD-10-CM | POA: Diagnosis not present

## 2016-07-09 DIAGNOSIS — I2510 Atherosclerotic heart disease of native coronary artery without angina pectoris: Secondary | ICD-10-CM | POA: Diagnosis present

## 2016-07-09 DIAGNOSIS — M109 Gout, unspecified: Secondary | ICD-10-CM | POA: Diagnosis present

## 2016-07-09 DIAGNOSIS — Z951 Presence of aortocoronary bypass graft: Secondary | ICD-10-CM | POA: Diagnosis not present

## 2016-07-09 LAB — TROPONIN I
TROPONIN I: 0.04 ng/mL — AB (ref <0.03)
Troponin I: 0.03 ng/mL (ref <0.03)
Troponin I: 0.04 ng/mL (ref <0.03)
Troponin I: 0.04 ng/mL (ref <0.03)

## 2016-07-09 LAB — COMPREHENSIVE METABOLIC PANEL
ALBUMIN: 3 g/dL — AB (ref 3.5–5.0)
ALK PHOS: 70 U/L (ref 38–126)
ALT: 13 U/L — ABNORMAL LOW (ref 14–54)
ANION GAP: 10 (ref 5–15)
AST: 23 U/L (ref 15–41)
BUN: 27 mg/dL — ABNORMAL HIGH (ref 6–20)
CALCIUM: 8.6 mg/dL — AB (ref 8.9–10.3)
CHLORIDE: 98 mmol/L — AB (ref 101–111)
CO2: 27 mmol/L (ref 22–32)
Creatinine, Ser: 5.85 mg/dL — ABNORMAL HIGH (ref 0.44–1.00)
GFR calc non Af Amer: 6 mL/min — ABNORMAL LOW (ref >60)
GFR, EST AFRICAN AMERICAN: 7 mL/min — AB (ref >60)
GLUCOSE: 98 mg/dL (ref 65–99)
POTASSIUM: 4 mmol/L (ref 3.5–5.1)
SODIUM: 135 mmol/L (ref 135–145)
Total Bilirubin: 0.5 mg/dL (ref 0.3–1.2)
Total Protein: 5.6 g/dL — ABNORMAL LOW (ref 6.5–8.1)

## 2016-07-09 LAB — CBC
HCT: 34.6 % — ABNORMAL LOW (ref 36.0–46.0)
Hemoglobin: 10.8 g/dL — ABNORMAL LOW (ref 12.0–15.0)
MCH: 32.4 pg (ref 26.0–34.0)
MCHC: 31.2 g/dL (ref 30.0–36.0)
MCV: 103.9 fL — ABNORMAL HIGH (ref 78.0–100.0)
Platelets: 246 10*3/uL (ref 150–400)
RBC: 3.33 MIL/uL — ABNORMAL LOW (ref 3.87–5.11)
RDW: 18.1 % — ABNORMAL HIGH (ref 11.5–15.5)
WBC: 6 10*3/uL (ref 4.0–10.5)

## 2016-07-09 LAB — RENAL FUNCTION PANEL
Albumin: 2.9 g/dL — ABNORMAL LOW (ref 3.5–5.0)
Anion gap: 13 (ref 5–15)
BUN: 33 mg/dL — ABNORMAL HIGH (ref 6–20)
CO2: 27 mmol/L (ref 22–32)
Calcium: 8.6 mg/dL — ABNORMAL LOW (ref 8.9–10.3)
Chloride: 93 mmol/L — ABNORMAL LOW (ref 101–111)
Creatinine, Ser: 6.37 mg/dL — ABNORMAL HIGH (ref 0.44–1.00)
GFR calc Af Amer: 7 mL/min — ABNORMAL LOW (ref >60)
GFR calc non Af Amer: 6 mL/min — ABNORMAL LOW (ref >60)
Glucose, Bld: 165 mg/dL — ABNORMAL HIGH (ref 65–99)
Phosphorus: 4.9 mg/dL — ABNORMAL HIGH (ref 2.5–4.6)
Potassium: 3.8 mmol/L (ref 3.5–5.1)
Sodium: 133 mmol/L — ABNORMAL LOW (ref 135–145)

## 2016-07-09 LAB — CBC WITH DIFFERENTIAL/PLATELET
Basophils Absolute: 0.1 10*3/uL (ref 0.0–0.1)
Basophils Relative: 1 %
Eosinophils Absolute: 0.6 10*3/uL (ref 0.0–0.7)
Eosinophils Relative: 10 %
HEMATOCRIT: 35.3 % — AB (ref 36.0–46.0)
HEMOGLOBIN: 11 g/dL — AB (ref 12.0–15.0)
LYMPHS ABS: 0.9 10*3/uL (ref 0.7–4.0)
LYMPHS PCT: 16 %
MCH: 32.2 pg (ref 26.0–34.0)
MCHC: 31.2 g/dL (ref 30.0–36.0)
MCV: 103.2 fL — AB (ref 78.0–100.0)
MONO ABS: 0.7 10*3/uL (ref 0.1–1.0)
MONOS PCT: 13 %
NEUTROS ABS: 3.3 10*3/uL (ref 1.7–7.7)
NEUTROS PCT: 60 %
Platelets: 254 10*3/uL (ref 150–400)
RBC: 3.42 MIL/uL — ABNORMAL LOW (ref 3.87–5.11)
RDW: 18.3 % — AB (ref 11.5–15.5)
WBC: 5.4 10*3/uL (ref 4.0–10.5)

## 2016-07-09 LAB — MAGNESIUM: Magnesium: 1.5 mg/dL — ABNORMAL LOW (ref 1.7–2.4)

## 2016-07-09 MED ORDER — MAGNESIUM SULFATE 2 GM/50ML IV SOLN
INTRAVENOUS | Status: AC
  Filled 2016-07-09: qty 50

## 2016-07-09 MED ORDER — SODIUM CHLORIDE 0.9 % IV SOLN: 100.0000 mL | INTRAVENOUS | Status: DC | PRN

## 2016-07-09 MED ORDER — HYDRALAZINE HCL 50 MG PO TABS
50.0000 mg | ORAL_TABLET | Freq: Three times a day (TID) | ORAL | Status: DC
Start: 2016-07-09 — End: 2016-07-09

## 2016-07-09 MED ORDER — CLONIDINE HCL 0.1 MG PO TABS
0.1000 mg | ORAL_TABLET | Freq: Once | ORAL | Status: AC
  Administered 2016-07-09: 0.1 mg via ORAL
  Filled 2016-07-09: qty 1

## 2016-07-09 MED ORDER — DEXTROSE 5 % IV SOLN
500.0000 mg | Freq: Once | INTRAVENOUS | Status: AC
  Administered 2016-07-09: 500 mg via INTRAVENOUS
  Filled 2016-07-09: qty 0.5

## 2016-07-09 MED ORDER — SIMVASTATIN 10 MG PO TABS
ORAL_TABLET | ORAL | Status: AC
  Filled 2016-07-09: qty 2

## 2016-07-09 MED ORDER — LIDOCAINE HCL (PF) 1 % IJ SOLN: 5.0000 mL | INTRAMUSCULAR | Status: DC | PRN

## 2016-07-09 MED ORDER — DEXTROSE 5 % IV SOLN
INTRAVENOUS | Status: AC
  Filled 2016-07-09: qty 1

## 2016-07-09 MED ORDER — HEPARIN SODIUM (PORCINE) 1000 UNIT/ML DIALYSIS
20.0000 [IU]/kg | INTRAMUSCULAR | Status: DC | PRN
  Filled 2016-07-09: qty 2

## 2016-07-09 MED ORDER — HYDRALAZINE HCL 25 MG PO TABS
25.0000 mg | ORAL_TABLET | Freq: Three times a day (TID) | ORAL | Status: DC
  Administered 2016-07-09 – 2016-07-10 (×2): 25 mg via ORAL
  Filled 2016-07-09 (×2): qty 1

## 2016-07-09 MED ORDER — LIDOCAINE-PRILOCAINE 2.5-2.5 % EX CREA: 1.0000 "application " | TOPICAL_CREAM | CUTANEOUS | Status: DC | PRN

## 2016-07-09 MED ORDER — HEPARIN SODIUM (PORCINE) 1000 UNIT/ML DIALYSIS
1000.0000 [IU] | INTRAMUSCULAR | Status: DC | PRN
  Filled 2016-07-09: qty 1

## 2016-07-09 MED ORDER — ALBUTEROL SULFATE (2.5 MG/3ML) 0.083% IN NEBU: 2.5000 mg | INHALATION_SOLUTION | Freq: Four times a day (QID) | RESPIRATORY_TRACT | Status: DC | PRN

## 2016-07-09 MED ORDER — VANCOMYCIN HCL 10 G IV SOLR
1250.0000 mg | Freq: Once | INTRAVENOUS | Status: AC
  Administered 2016-07-09: 1250 mg via INTRAVENOUS
  Filled 2016-07-09: qty 1250

## 2016-07-09 MED ORDER — DOXYCYCLINE HYCLATE 100 MG IV SOLR
INTRAVENOUS | Status: AC
  Filled 2016-07-09: qty 100

## 2016-07-09 MED ORDER — DIPHENHYDRAMINE HCL 50 MG/ML IJ SOLN
25.0000 mg | Freq: Once | INTRAMUSCULAR | Status: AC
  Administered 2016-07-09: 25 mg via INTRAVENOUS
  Filled 2016-07-09: qty 1

## 2016-07-09 MED ORDER — HYDRALAZINE HCL 20 MG/ML IJ SOLN: 10.0000 mg | Freq: Four times a day (QID) | INTRAMUSCULAR | Status: DC | PRN

## 2016-07-09 MED ORDER — CLONIDINE HCL 0.2 MG PO TABS
0.2000 mg | ORAL_TABLET | Freq: Three times a day (TID) | ORAL | Status: DC
  Administered 2016-07-09: 0.2 mg via ORAL
  Filled 2016-07-09 (×3): qty 1

## 2016-07-09 MED ORDER — NITROGLYCERIN 2 % TD OINT
0.5000 [in_us] | TOPICAL_OINTMENT | Freq: Four times a day (QID) | TRANSDERMAL | Status: DC
  Administered 2016-07-09 – 2016-07-10 (×4): 0.5 [in_us] via TOPICAL
  Filled 2016-07-09 (×4): qty 1

## 2016-07-09 MED ORDER — PENTAFLUOROPROP-TETRAFLUOROETH EX AERO: 1.0000 "application " | INHALATION_SPRAY | CUTANEOUS | Status: DC | PRN

## 2016-07-09 MED ORDER — ISOSORBIDE MONONITRATE ER 60 MG PO TB24: 60.0000 mg | ORAL_TABLET | Freq: Every day | ORAL | Status: DC

## 2016-07-09 MED ORDER — MAGNESIUM SULFATE IN D5W 1-5 GM/100ML-% IV SOLN
1.0000 g | Freq: Once | INTRAVENOUS | Status: AC
  Administered 2016-07-09: 1 g via INTRAVENOUS
  Filled 2016-07-09: qty 100

## 2016-07-09 MED ORDER — HEPARIN SODIUM (PORCINE) 5000 UNIT/ML IJ SOLN
5000.0000 [IU] | Freq: Three times a day (TID) | INTRAMUSCULAR | Status: DC
  Administered 2016-07-09 (×2): 5000 [IU] via SUBCUTANEOUS
  Filled 2016-07-09 (×2): qty 1

## 2016-07-09 MED ORDER — CLONIDINE HCL 0.1 MG PO TABS: 0.1000 mg | ORAL_TABLET | Freq: Three times a day (TID) | ORAL | Status: DC

## 2016-07-09 MED ORDER — CLONIDINE HCL 0.1 MG PO TABS
0.1000 mg | ORAL_TABLET | Freq: Three times a day (TID) | ORAL | Status: DC
Start: 2016-07-09 — End: 2016-07-09

## 2016-07-09 MED ORDER — CLONIDINE HCL 0.1 MG PO TABS: 0.3000 mg | ORAL_TABLET | Freq: Three times a day (TID) | ORAL | Status: DC

## 2016-07-09 MED ORDER — DOXYCYCLINE HYCLATE 100 MG IV SOLR
100.0000 mg | Freq: Two times a day (BID) | INTRAVENOUS | Status: DC
  Administered 2016-07-09 (×2): 100 mg via INTRAVENOUS
  Filled 2016-07-09 (×7): qty 100

## 2016-07-09 MED ORDER — ALTEPLASE 2 MG IJ SOLR
2.0000 mg | Freq: Once | INTRAMUSCULAR | Status: DC | PRN
  Filled 2016-07-09: qty 2

## 2016-07-09 MED ORDER — MAGNESIUM SULFATE 2 GM/50ML IV SOLN
2.0000 g | Freq: Once | INTRAVENOUS | Status: AC
  Administered 2016-07-09: 2 g via INTRAVENOUS

## 2016-07-09 NOTE — ED Notes (Signed)
Pt complaint of itching and with redness to abd, arms chest Vanc stopped and Hospitalist called

## 2016-07-09 NOTE — Progress Notes (Signed)
PROGRESS NOTE                                                                                                                                                                                                             Patient Demographics:    Shawna [name], is a 75 y.o. female, DOB - [birth year], IOE:[id number]  Admit date - 07/08/2016   Admitting Physician No admitting provider for patient encounter.  Outpatient Primary MD for the patient is [name], MD  LOS - 0  Chief Complaint  Patient presents with  . Hypertension       Brief Narrative  Shawna [name] is a 75 y.o. female with history of ESRD on hemodialysis on Monday Wednesday and Friday, hypertension, CAD status post CABG and stenting and cardiac arrest last month in the setting of hyperkalemia was referred to the ER after patient was found to be having persistently elevated blood pressure at the dialysis.   Her workup in the ER was consistent with hypertensive urgency/crisis and questionable pneumonia.   Subjective:    Shawna [name] today has, No headache, No chest pain, No abdominal pain - No Nausea, No new weakness tingling or numbness, No Cough - SOB.     Assessment  & Plan :    Principal Problem:   Hypertensive emergency Active Problems:   ESRD on hemodialysis ([place])   Hx of CABG   Chronic diastolic CHF (congestive heart failure) ([place])   1.Hypertensive urgency/emergency. Much improved on nitroglycerin infusion, will increase Catapres dose, we'll add Nitropaste, stop nitroglycerin infusion, as needed IV hydralazine along with oral hydralazine. Monitor blood pressure and adjust medications as needed. Currently symptom-free.  2. ESRD. Dialysis on Monday Wednesday Friday, dialyzed on 07/08/2016. Stable.  3. History of PSVT, V. tach in the ER 11 beat and nonsustained. Asymptomatic V. tach, magnesium was low and replaced, is on amiodarone which will be  continued. If reoccurs we'll add beta blocker as well.  4. Anemia of chronic disease. Stable.  5. History of stroke in the past. Continue aspirin and statin for secondary prevention.    Family Communication  :  None  Code Status :  Full  Diet : Diet renal with fluid restriction Fluid restriction: 1200 mL Fluid; Room service appropriate? Yes; Fluid consistency: Thin   Disposition Plan  :  Home in am  Consults  :  Renal  Procedures  :  CT head. Nonacute.  DVT Prophylaxis  :    Heparin    Lab Results  Component Value Date   PLT 254 07/09/2016    Inpatient Medications  Scheduled Meds: . amiodarone  200 mg Oral Daily  . aspirin EC  81 mg Oral Daily  . cinacalcet  30 mg Oral Q supper  . cloNIDine  0.1 mg Oral Once  . cloNIDine  0.2 mg Oral TID  . [START ON 07/11/2016] epoetin alfa  10,000 Units Intravenous Q M,W,F-HD  . hydrALAZINE  25 mg Oral Q8H  . lanthanum  1,000 mg Oral TID WC  . multivitamin  1 tablet Oral Daily  . nitroGLYCERIN  0.5 inch Topical Q6H  . pantoprazole  40 mg Oral Daily  . simvastatin  20 mg Oral QHS   Continuous Infusions: . doxycycline (VIBRAMYCIN) IV Stopped (07/09/16 1237)  . magnesium sulfate     PRN Meds:.acetaminophen **OR** acetaminophen, albuterol, ALPRAZolam, fluticasone, hydrALAZINE, ondansetron **OR** ondansetron (ZOFRAN) IV  Antibiotics  :    Anti-infectives    Start     Dose/Rate Route Frequency Ordered Stop   07/09/16 1100  doxycycline (VIBRAMYCIN) 100 mg in dextrose 5 % 250 mL IVPB     100 mg 125 mL/hr over 120 Minutes Intravenous Every 12 hours 07/09/16 0658     07/09/16 0445  vancomycin (VANCOCIN) 1,250 mg in sodium chloride 0.9 % 250 mL IVPB     1,250 mg 166.7 mL/hr over 90 Minutes Intravenous  Once 07/09/16 0431 07/09/16 0700   07/09/16 0430  aztreonam (AZACTAM) 500 mg in dextrose 5 % 50 mL IVPB     500 mg 100 mL/hr over 30 Minutes Intravenous  Once 07/09/16 0428 07/09/16 0638         Objective:   Vitals:   07/09/16  1000 07/09/16 1020 07/09/16 1100 07/09/16 1200  BP: (!) 140/45 131/56 157/76 184/76  Pulse:  66 63 63  Resp: 16 19 21 21   Temp:      TempSrc:      SpO2:  94% 95% 94%  Weight:      Height:        Wt Readings from Last 3 Encounters:  07/08/16 56.7 kg (125 lb)  05/25/16 55.3 kg (122 lb)  04/13/16 59.4 kg (131 lb)     Intake/Output Summary (Last 24 hours) at 07/09/16 1304 Last data filed at 07/09/16 1237  Gross per 24 hour  Intake              290 ml  Output                0 ml  Net              290 ml     Physical Exam  Awake Alert, Oriented X 3, No new F.N deficits, Normal affect [place].AT,PERRAL Supple Neck,No JVD, No cervical lymphadenopathy appriciated.  Symmetrical Chest wall movement, Good air movement bilaterally, CTAB RRR,No Gallops,Rubs or new Murmurs, No Parasternal Heave +ve B.Sounds, Abd Soft, No tenderness, No organomegaly appriciated, No rebound - guarding or rigidity. No Cyanosis, Clubbing or edema, No new Rash or bruise       Data Review:    CBC  Recent Labs Lab 07/08/16 2007 07/09/16 0607  WBC 5.4 5.4  HGB 11.0* 11.0*  HCT 35.0* 35.3*  PLT 249 254  MCV 103.2* 103.2*  MCH 32.4 32.2  MCHC 31.4 31.2  RDW 18.0* 18.3*  LYMPHSABS 1.0 0.9  MONOABS 0.4 0.7  EOSABS 0.5 0.6  BASOSABS 0.1 0.1    Chemistries   Recent Labs Lab 07/08/16 2007 07/09/16 0244 07/09/16 0607  NA 133*  --  135  K 3.3*  --  4.0  CL 94*  --  98*  CO2 31  --  27  GLUCOSE 104*  --  98  BUN 21*  --  27*  CREATININE 4.81*  --  5.85*  CALCIUM 8.4*  --  8.6*  MG  --  1.5*  --   AST  --   --  23  ALT  --   --  13*  ALKPHOS  --   --  70  BILITOT  --   --  0.5   ------------------------------------------------------------------------------------------------------------------ No results for input(s): CHOL, HDL, LDLCALC, TRIG, CHOLHDL, LDLDIRECT in the last 72 hours.  Lab Results  Component Value Date   HGBA1C 4.8 06/12/2012    ------------------------------------------------------------------------------------------------------------------ No results for input(s): TSH, T4TOTAL, T3FREE, THYROIDAB in the last 72 hours.  Invalid input(s): FREET3 ------------------------------------------------------------------------------------------------------------------ No results for input(s): VITAMINB12, FOLATE, FERRITIN, TIBC, IRON, RETICCTPCT in the last 72 hours.  Coagulation profile No results for input(s): INR, PROTIME in the last 168 hours.  No results for input(s): DDIMER in the last 72 hours.  Cardiac Enzymes  Recent Labs Lab 07/08/16 2352 07/09/16 0244 07/09/16 0607  TROPONINI 0.04* 0.03* 0.04*   ------------------------------------------------------------------------------------------------------------------    Component Value Date/Time   BNP 1,041.0 (H) 05/04/2015 0518    Micro Results No results found for this or any previous visit (from the past 240 hour(s)).  Radiology Reports Ct Head Wo Contrast  Result Date: 07/08/2016 CLINICAL DATA:  Elevated blood pressure after dialysis currently with headache. No known injury. EXAM: CT HEAD WITHOUT CONTRAST TECHNIQUE: Contiguous axial images were obtained from the base of the skull through the vertex without intravenous contrast. COMPARISON:  Head CTs from 05/19/2016 and 03/09/2016 FINDINGS: Brain: Chronic mild cerebral atrophy without ventricular dilatation. Chronic small vessel ischemic disease of the white matter noted bilaterally with chronic left lacunar infarct in the left putamen/external capsule. No acute intracranial hemorrhage, midline shift or mass. No extra-axial fluid collection. No effacement of the basal cisterns. Vascular: Vascular calcifications in the intracranial carotid arteries and both vertebral arteries. Skull: No acute osseous abnormality. Sinuses/Orbits: Mucous retention cyst in the sphenoid. Orbits are nonsuspicious mastoids appear  clear. Other: None IMPRESSION: Chronic mild cerebral atrophy with small vessel ischemic disease of periventricular white matter. Chronic left putamenal/external capsule lacunar infarct. No acute intracranial abnormality noted. Electronically Signed   By: Ashley Royalty M.D.   On: 07/08/2016 21:14   Dg Chest Port 1 View  Result Date: 07/09/2016 CLINICAL DATA:  Dyspnea and cough. EXAM: PORTABLE CHEST 1 VIEW COMPARISON:  06/15/2016 CXR FINDINGS: Patient is status post median sternotomy. There is stable cardiomegaly. The aorta is atherosclerotic without aneurysm. Bibasilar atelectasis right greater than left is noted. Slightly more confluent appearing airspace opacities at the right lung base cannot exclude pneumonia. No acute osseous appearing abnormality. IMPRESSION: Cardiomegaly with aortic atherosclerosis. Bibasilar atelectasis with findings suspicious for superimposed right lower lobe pneumonia. Electronically Signed   By: Ashley Royalty M.D.   On: 07/09/2016 00:51    Time Spent in minutes  30   SINGH,PRASHANT K M.D on 07/09/2016 at 1:04 PM  Between 7am to 7pm - Pager - [phone]  After 7pm go to [email or web address] - password [facility]  [facility] -  Office  [phone]

## 2016-07-09 NOTE — ED Notes (Signed)
Noted on last 2 oral medication administrations, pt has been eating and requested me to leave her med until she is finished.  When I went back to ensure it was taken, pt had forgotten and pill was still in cup.  Reminded pt both times and she stated "I am always forgetting my pills."  Concerned about possible forgetfulness leading to unintentional noncompliance.

## 2016-07-09 NOTE — ED Notes (Signed)
Pt refusing to be stuck for any blood work at this time.  Explained importance of Troponin to pt, who states she will do it later.

## 2016-07-09 NOTE — Consult Note (Addendum)
DAVEN PINCKNEY MRN: [id number] DOB/AGE: April 30, 1941 75 y.o. Primary Care Physician:HOWARD, Lennette Bihari, MD Admit date: 07/08/2016 Chief Complaint:  Chief Complaint  Patient presents with  . Hypertension   HPI: Pt is 75 year old female withg past medical hx of ESRD who was presented to Er with c/o high Blood pressure.  HPI dates back to yesterday when pt says " My blood pressure was high at dialysis unit and they sent me after my dialysis" On Presentation to Er pt was was found again in the ED to have a SBP of up to 230's. Pt was admitted with NTG drip. Pt on CXR had infiltrates and was stared on epiric abx but pt had reaction to vancomycin. NO c/o dypnea No c/o fever/cough/chiils NO c/o nausea/vomiting NO c/o hematuria No c/o syncope No c/o recent travel. NO c/o blood in stools No c/o hematuria NO c/o black tarry stools. Pt says " I am ready to go home"  Pt had her full dialysis tx yesterday on Friday   Past medical hx  Reviewed ESRD HTN CAD Past Medical History:  Diagnosis Date  . Anxiety   . Arthritis   . AVM (arteriovenous malformation) of colon   . CAD (coronary artery disease)    a. 12/2011 NSTEMI/Cath/PCI LCX (2.25x14 Resolute DES) & D1 (2.25x22 Resolute DES);  b. 01/2012 Cath/PCI: LM 30, LAD 30p, 40-72m, D1 stent ok, 99 in sm branch of diag, LCX patent stent, OM1 20, RCA 95 ost (4.0x12 Promus DES), EF 55%;  c. 04/2012 Lexi Cardiolite  EF 48%, small area of scar @ base/mid inflat wall with mild peri-infarct ischemia.; CABG 12/4  . Carotid artery disease ([place])    a. 77-41% LICA, 08/8784   . Chronic bronchitis ([place])   . Chronic diastolic CHF (congestive heart failure) ([place])    a. 02/2012 Echo EF 60-65%, nl wall motion, Gr 1 DD, mod MR  . Colon cancer ([place]) 1992  . Esophageal stricture   . ESRD on hemodialysis ([place])    ESRD due to HTN, started dialysis 2011 and gets HD at [facility] with Dr Hinda Lenis on MWF schedule.  Access is LUA AVF as of Sept 2014.   Marland Kitchen GERD (gastroesophageal  reflux disease)   . High cholesterol 12/2011  . History of blood transfusion 07/2011; 12/2011; 01/2012 X 2; 04/2012  . History of gout   . History of lower GI bleeding   . Hypertension   . Iron deficiency anemia   . Mitral regurgitation    a. Moderate by echo, 02/2012  . Myocardial infarction   . Ovarian cancer ([place]) 1992  . Pneumonia ~ 2009  . PUD (peptic ulcer disease)   . TIA (transient ischemic attack)       Family hx Reviewed NO hx of ESRD    Social History: denies smoking/drinking/illegal drug usage Lives with her husband.   Allergies:  reviewed       VEH:MCNOB from the symptoms mentioned above,there are no other symptoms referable to all systems reviewed.  Marland Kitchen amiodarone  200 mg Oral Daily  . aspirin EC  81 mg Oral Daily  . cinacalcet  30 mg Oral Q supper  . cloNIDine  0.2 mg Oral TID  . [START ON 07/11/2016] epoetin alfa  10,000 Units Intravenous Q M,W,F-HD  . heparin subcutaneous  5,000 Units Subcutaneous Q8H  . hydrALAZINE  25 mg Oral Q8H  . lanthanum  1,000 mg Oral TID WC  . multivitamin  1 tablet Oral Daily  . nitroGLYCERIN  0.5 inch Topical Q6H  . pantoprazole  40 mg Oral Daily  . simvastatin  20 mg Oral QHS      Physical Exam: Vital signs in last 24 hours: Pulse Rate:  [61-93] 63 (12/30 1220) Resp:  [13-25] 17 (12/30 1541) BP: (100-240)/(45-105) 145/65 (12/30 1541) SpO2:  [94 %-100 %] 98 % (12/30 1541) Weight change:     Intake/Output from previous day: 12/29 0701 - 12/30 0700 In: 40 [IV Piggyback:40] Out: -  Total I/O In: 350 [IV Piggyback:350] Out: -    Physical Exam: General- pt is awake,alert, oriented to time place and person Resp- No acute REsp distress, rhonchi  at bases CVS- S1S2 regular in rate and rhythm GIT- BS+, soft, NT, ND EXT- NO LE Edema, NO Cyanosis CNS- CN 2-12 grossly intact. Moving all 4 extremities Psych- normal mood and affect Access-  AVF+ bruit .  Lab Results: CBC  Recent Labs  07/08/16 2007  07/09/16 0607  WBC 5.4 5.4  HGB 11.0* 11.0*  HCT 35.0* 35.3*  PLT 249 254    BMET  Recent Labs  07/08/16 2007 07/09/16 0607  NA 133* 135  K 3.3* 4.0  CL 94* 98*  CO2 31 27  GLUCOSE 104* 98  BUN 21* 27*  CREATININE 4.81* 5.85*  CALCIUM 8.4* 8.6*      Lab Results  Component Value Date   PTH 709.7 (H) 05/29/2013   CALCIUM 8.6 (L) 07/09/2016   CAION 1.07 (L) 09/08/2015   PHOS 8.4 (H) 05/24/2016     Albumin=3 Corrected calcium 9.4   Impression: 1)Renal ESRd on HD On MWF schedule Pt was  dialyzed yesterday  2)HTN  BP  at goal   Medication- On vasodilators On Central Acting Sympatholytics-  3)Anemia in ESRD the goal fpr  HGb is  9--11. Pt HGb  at goal Will keep on epo during hd   4)CKD Mineral-Bone Disorder Secondary Hyperparathyroidism present    On Cinacalcet Phosphorus not at goal.     On binders Calcium is at goal  5)Resp-Admitted with pneumonia. On antibiotics Primary MD following  6)Electrolytes  Normokalemic Normonatremic   7)Acid base Co2 at goal     Plan:  If pt is inpt, Will dilayze in am as outpt unit closed on Monday  Will use 3 k bath Will keep on epo    Makenzi Bannister S 07/09/2016, 3:56 PM

## 2016-07-09 NOTE — ED Notes (Signed)
Delay in antibiotics as pt is dialysis patient and after multiple sticks has only a 22 IV in her R upper arm

## 2016-07-09 NOTE — ED Notes (Signed)
Call to Octavia Bruckner, RN, [facility] for meds

## 2016-07-09 NOTE — ED Notes (Signed)
Pt eating lunch with her husband at bedside.  Denies any complaints.  States she feels she is ready to go home.

## 2016-07-09 NOTE — ED Notes (Signed)
Doxycycline received from pharmacy.  Other meds not available at this time.

## 2016-07-09 NOTE — Progress Notes (Signed)
ANTIBIOTIC CONSULT NOTE-Preliminary  Pharmacy Consult for Vancomycin and Aztreonam Indication: Pneumonia   Patient Measurements: Height: 5\' 1"  (154.9 cm) Weight: 125 lb (56.7 kg) IBW/kg (Calculated) : 47.8  Vital Signs: BP: 184/89 (12/30 0400) Pulse Rate: 67 (12/30 0400)  Labs:  Recent Labs  07/08/16 2007  WBC 5.4  HGB 11.0*  PLT 249  CREATININE 4.81*    Estimated Creatinine Clearance: 7.5 mL/min (by C-G formula based on SCr of 4.81 mg/dL (H)).  No results for input(s): VANCOTROUGH, VANCOPEAK, VANCORANDOM, GENTTROUGH, GENTPEAK, GENTRANDOM, TOBRATROUGH, TOBRAPEAK, TOBRARND, AMIKACINPEAK, AMIKACINTROU, AMIKACIN in the last 72 hours.   Microbiology: No results found for this or any previous visit (from the past 720 hour(s)).  Medical History: Past Medical History:  Diagnosis Date  . Anxiety   . Arthritis   . AVM (arteriovenous malformation) of colon   . CAD (coronary artery disease)    a. 12/2011 NSTEMI/Cath/PCI LCX (2.25x14 Resolute DES) & D1 (2.25x22 Resolute DES);  b. 01/2012 Cath/PCI: LM 30, LAD 30p, 40-33m, D1 stent ok, 99 in sm branch of diag, LCX patent stent, OM1 20, RCA 95 ost (4.0x12 Promus DES), EF 55%;  c. 04/2012 Lexi Cardiolite  EF 48%, small area of scar @ base/mid inflat wall with mild peri-infarct ischemia.; CABG 12/4  . Carotid artery disease ([place])    a. 40-97% LICA, 09/5327   . Chronic bronchitis ([place])   . Chronic diastolic CHF (congestive heart failure) ([place])    a. 02/2012 Echo EF 60-65%, nl wall motion, Gr 1 DD, mod MR  . Colon cancer ([place]) 1992  . Esophageal stricture   . ESRD on hemodialysis ([place])    ESRD due to HTN, started dialysis 2011 and gets HD at [facility] with Dr Hinda Lenis on MWF schedule.  Access is LUA AVF as of Sept 2014.   Marland Kitchen GERD (gastroesophageal reflux disease)   . High cholesterol 12/2011  . History of blood transfusion 07/2011; 12/2011; 01/2012 X 2; 04/2012  . History of gout   . History of lower GI bleeding   . Hypertension   .  Iron deficiency anemia   . Mitral regurgitation    a. Moderate by echo, 02/2012  . Myocardial infarction   . Ovarian cancer ([place]) 1992  . Pneumonia ~ 2009  . PUD (peptic ulcer disease)   . TIA (transient ischemic attack)     Medications:   Assessment: 75 yo female seen in the ED for persistent HTN. Pt is ESRD on hemodialysis. Pt also has complaint of SOB and cough. Chest x-ray shows a lower lobe pneumonia. Empiric antibiotics to be started.  Goal of Therapy:  Vancomycin P/T per hemodialysis protocol Eradicate infection  Plan:  Preliminary review of pertinent patient information completed.  Protocol will be initiated with one-time doses of Aztreonam 500 mg IV and Vancomycin 1250 mg IV.  Forestine Na clinical pharmacist will complete review during morning rounds to assess patient and finalize treatment regimen.  Norberto Sorenson, [facility] 07/09/2016,4:32 AM

## 2016-07-09 NOTE — ED Notes (Signed)
Pt complaint of itching with ? Rash to abd arms and chest  Dr Lara Mulch called with VO for benadryl 25 and stop Vanc

## 2016-07-09 NOTE — ED Notes (Signed)
Pt provided with breakfast tray at this time.  Denies any needs.

## 2016-07-10 ENCOUNTER — Ambulatory Visit (HOSPITAL_COMMUNITY): Payer: Medicare HMO

## 2016-07-10 ENCOUNTER — Inpatient Hospital Stay (HOSPITAL_COMMUNITY): Payer: Medicare HMO

## 2016-07-10 DIAGNOSIS — Z992 Dependence on renal dialysis: Secondary | ICD-10-CM | POA: Diagnosis not present

## 2016-07-10 DIAGNOSIS — N186 End stage renal disease: Secondary | ICD-10-CM | POA: Diagnosis not present

## 2016-07-10 LAB — CBC
HEMATOCRIT: 32.8 % — AB (ref 36.0–46.0)
Hemoglobin: 10.5 g/dL — ABNORMAL LOW (ref 12.0–15.0)
MCH: 32.6 pg (ref 26.0–34.0)
MCHC: 32 g/dL (ref 30.0–36.0)
MCV: 101.9 fL — AB (ref 78.0–100.0)
Platelets: 258 10*3/uL (ref 150–400)
RBC: 3.22 MIL/uL — ABNORMAL LOW (ref 3.87–5.11)
RDW: 18.1 % — AB (ref 11.5–15.5)
WBC: 6.1 10*3/uL (ref 4.0–10.5)

## 2016-07-10 LAB — POTASSIUM: Potassium: 4.7 mmol/L (ref 3.5–5.1)

## 2016-07-10 LAB — MAGNESIUM: Magnesium: 2.3 mg/dL (ref 1.7–2.4)

## 2016-07-10 IMAGING — DX DG CHEST 2V
2 series · 2 of 2 positions shown · non-contrast
Comparison: Radiograph [DATE].

CLINICAL DATA: Shortness of breath.

EXAM:
CHEST  2 VIEW

[chest pa]
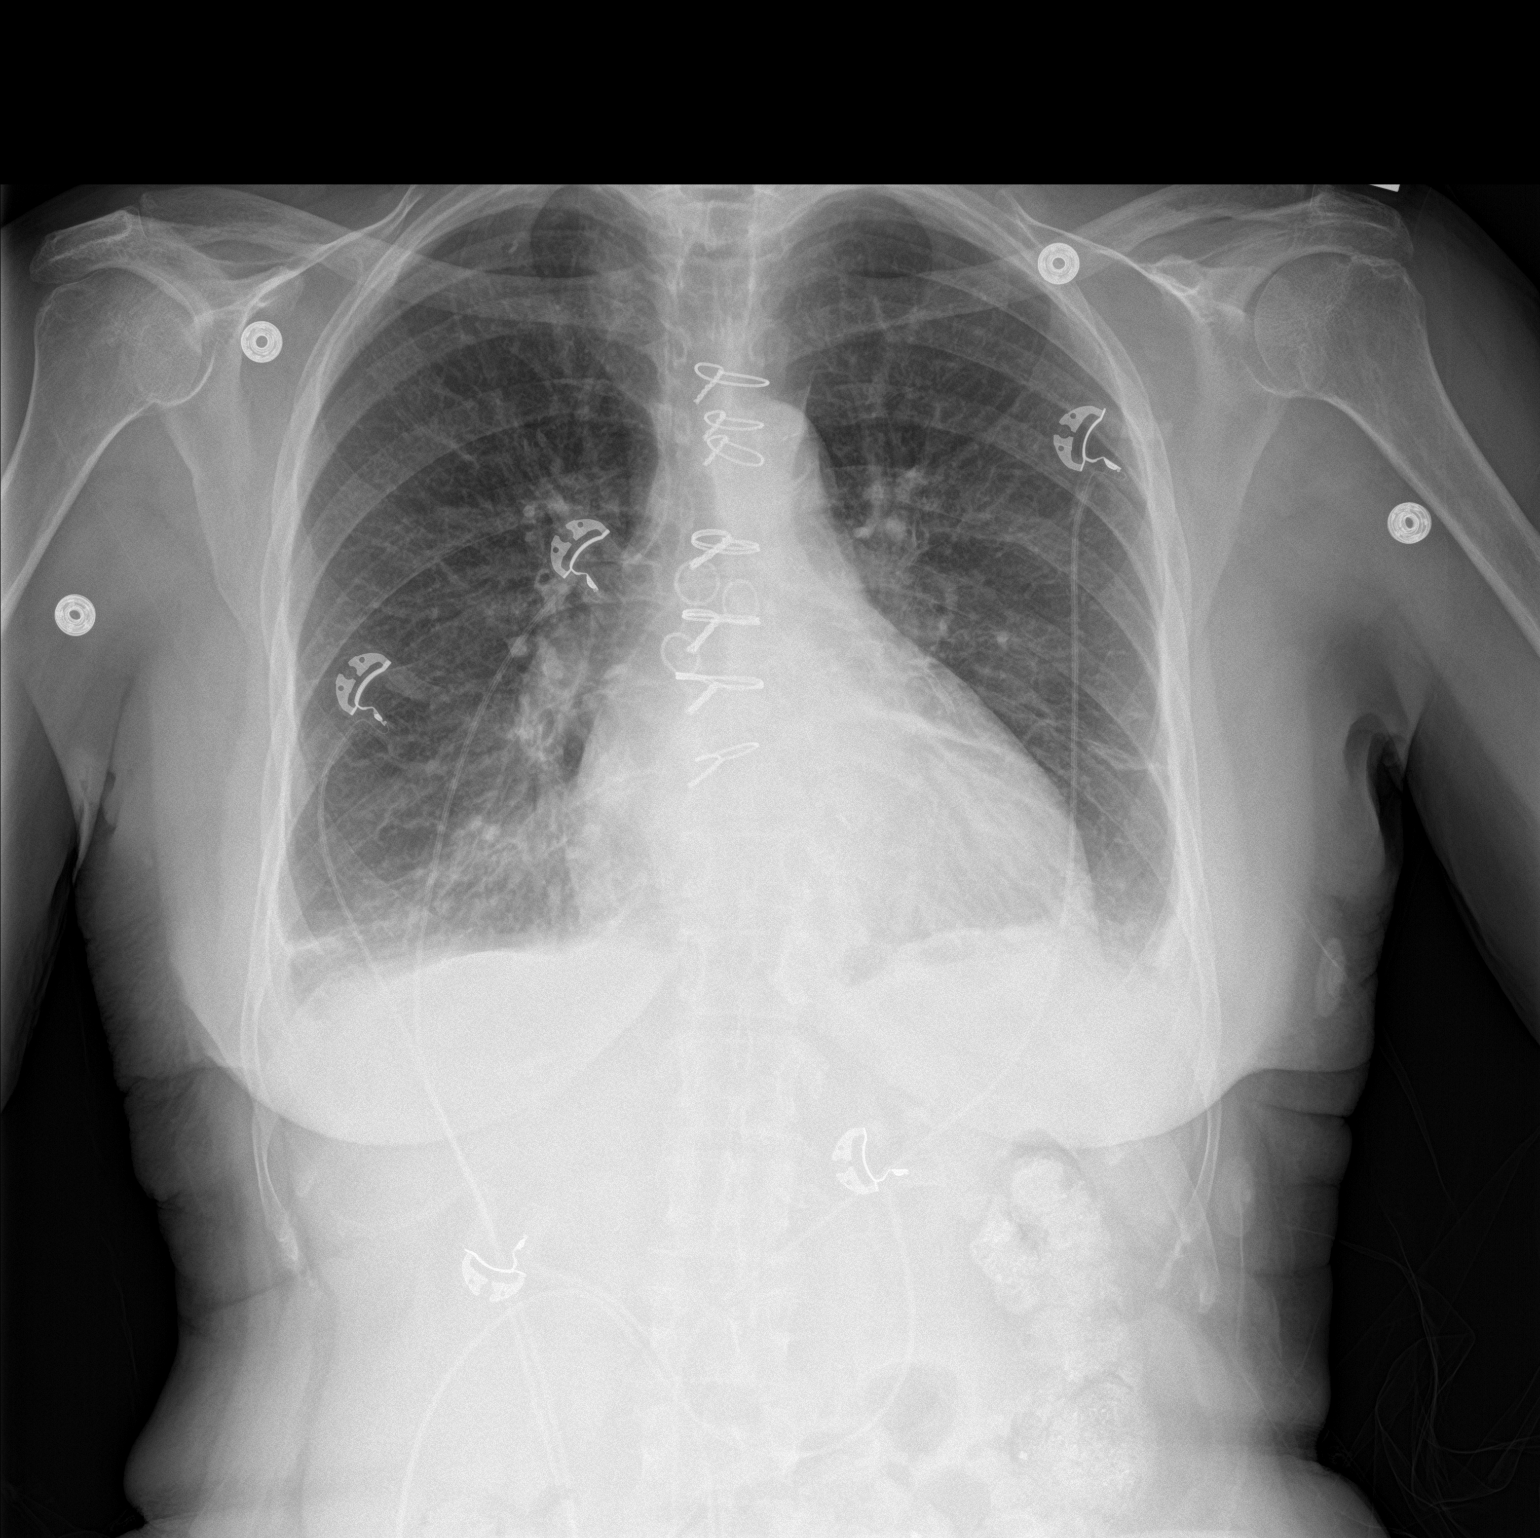

[chest lat]
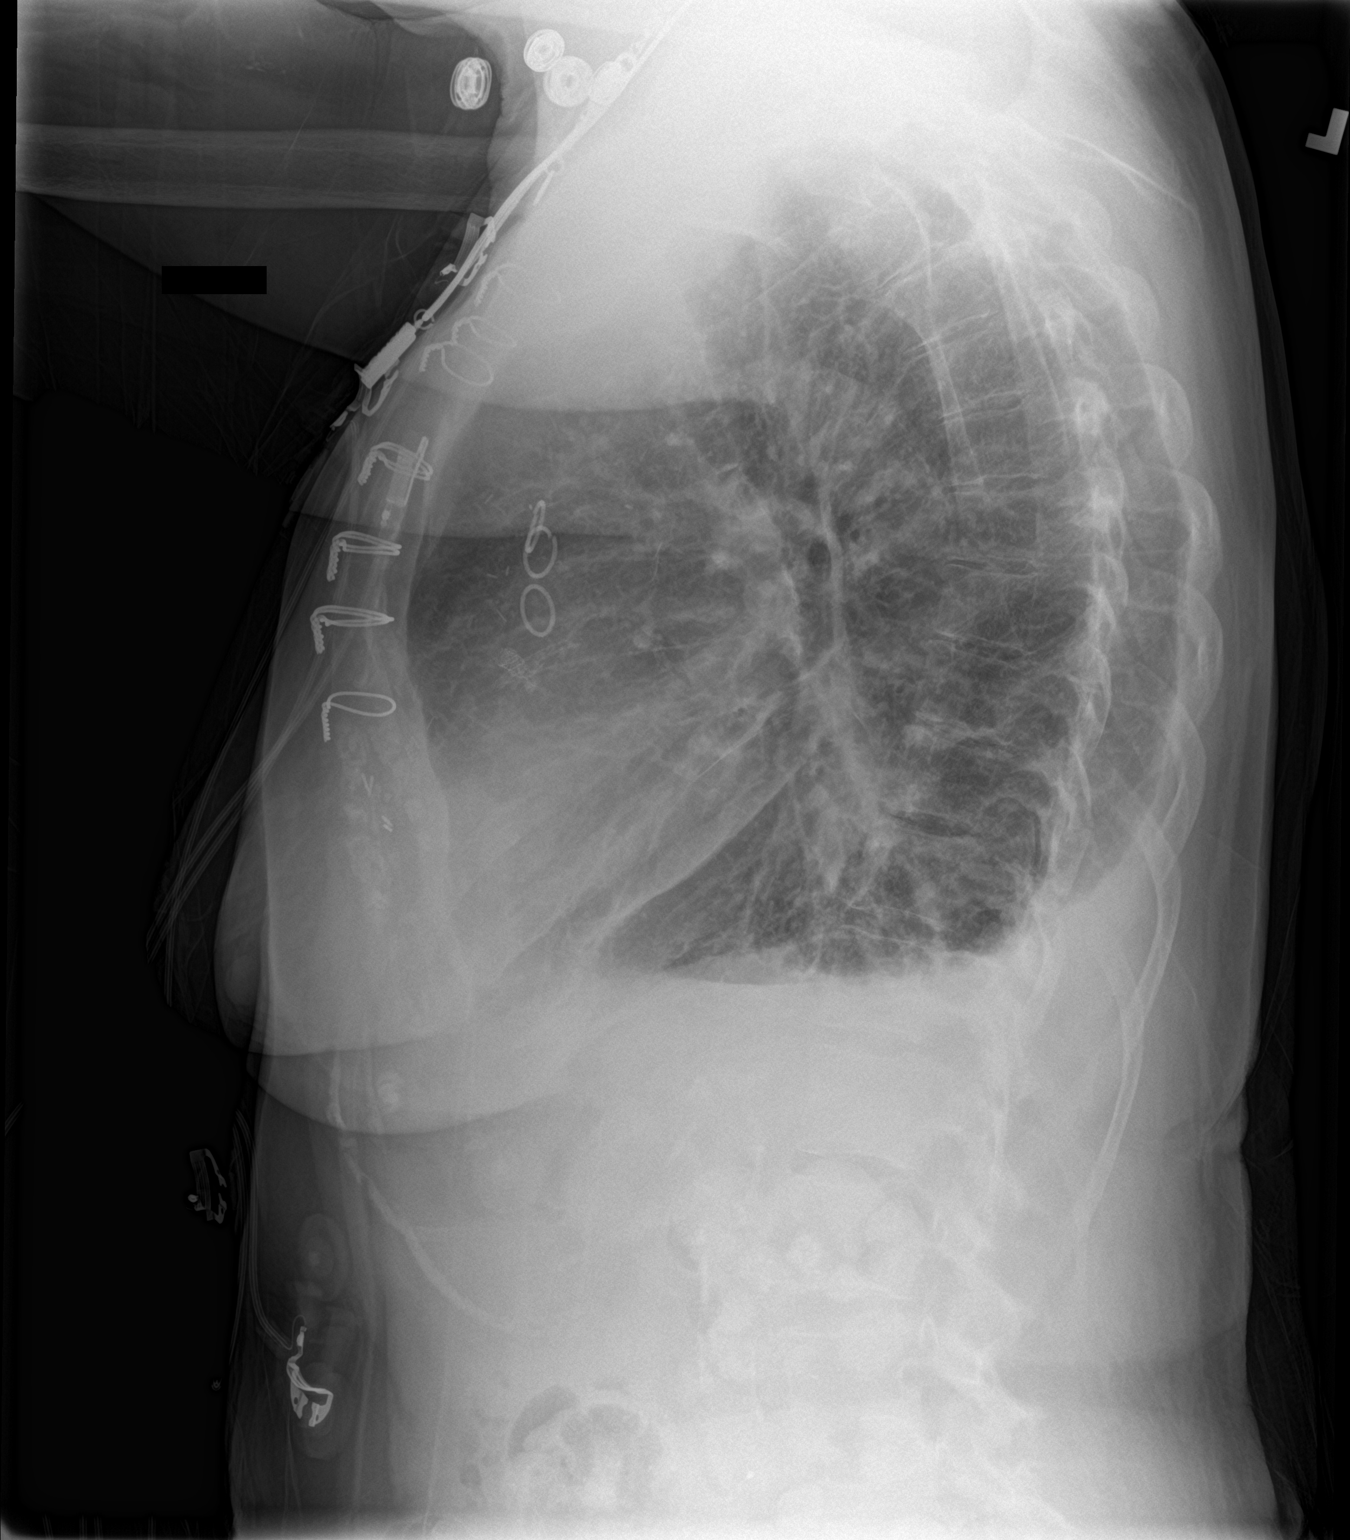

[2 of 2 positions shown; findings below may reference images not displayed]

FINDINGS: Stable cardiomediastinal silhouette. Status post coronary artery
bypass graft. Atherosclerosis of thoracic aorta is noted. No
pneumothorax is noted. Stable bibasilar subsegmental atelectasis is
noted with associated pleural effusions. Bony thorax is
unremarkable.
IMPRESSION: Stable bibasilar subsegmental atelectasis with associated pleural
effusions. Aortic atherosclerosis.

## 2016-07-10 MED ORDER — CLONIDINE HCL 0.2 MG PO TABS: 0.2000 mg | ORAL_TABLET | Freq: Three times a day (TID) | ORAL | 0 refills | Status: DC

## 2016-07-10 MED ORDER — ISOSORBIDE MONONITRATE ER 60 MG PO TB24: 60.0000 mg | ORAL_TABLET | Freq: Every day | ORAL | 0 refills | Status: DC

## 2016-07-10 MED ORDER — HYDRALAZINE HCL 50 MG PO TABS: 50.0000 mg | ORAL_TABLET | Freq: Three times a day (TID) | ORAL | 0 refills | Status: DC

## 2016-07-10 NOTE — Progress Notes (Signed)
Shawna [name]  MRN: [id number]  DOB/AGE: 1941-04-02 75 y.o.  Primary Care Physician:HOWARD, Lennette Bihari, MD  Admit date: 07/08/2016  Chief Complaint:  Chief Complaint  Patient presents with  . Hypertension    S-Pt presented on  07/08/2016 with  Chief Complaint  Patient presents with  . Hypertension  .    Pt seen on HD. Pt tolerating Hd.   Marland Kitchen amiodarone  200 mg Oral Daily  . aspirin EC  81 mg Oral Daily  . cinacalcet  30 mg Oral Q supper  . cloNIDine  0.2 mg Oral TID  . doxycycline (VIBRAMYCIN) IV  100 mg Intravenous Q12H  . [START ON 07/11/2016] epoetin alfa  10,000 Units Intravenous Q M,W,F-HD  . heparin subcutaneous  5,000 Units Subcutaneous Q8H  . hydrALAZINE  25 mg Oral Q8H  . lanthanum  1,000 mg Oral TID WC  . multivitamin  1 tablet Oral Daily  . nitroGLYCERIN  0.5 inch Topical Q6H  . pantoprazole  40 mg Oral Daily  . simvastatin  20 mg Oral QHS      Physical Exam: Vital signs in last 24 hours: Temp:  [98.2 F (36.8 C)-98.6 F (37 C)] 98.2 F (36.8 C) (12/31 0726) Pulse Rate:  [60-67] 60 (12/31 0726) Resp:  [13-21] 18 (12/31 0726) BP: (127-184)/(45-85) 173/68 (12/31 0726) SpO2:  [94 %-99 %] 94 % (12/31 0726) Weight:  [127 lb 13.9 oz (58 kg)-127 lb 14.4 oz (58 kg)] 127 lb 13.9 oz (58 kg) (12/31 0726) Weight change: 2 lb 14.4 oz (1.315 kg) Last BM Date: 07/08/16  Intake/Output from previous day: 12/30 0701 - 12/31 0700 In: 470 [P.O.:120; IV Piggyback:350] Out: -  No intake/output data recorded.   Physical Exam: General- pt is awake,alert, oriented to time place and person Resp- No acute REsp distress, decreased  at bases CVS- S1S2 regular in rate and rhythm GIT- BS+, soft, NT, ND EXT- NO LE Edema, NO Cyanosist Access-  AVF+ bruit .  Lab Results: CBC  Recent Labs  07/09/16 0607 07/09/16 1456  WBC 5.4 6.0  HGB 11.0* 10.8*  HCT 35.3* 34.6*  PLT 254 246    BMET  Recent Labs  07/09/16 0607 07/09/16 1456  NA 135 133*  K 4.0 3.8  CL 98* 93*   CO2 27 27  GLUCOSE 98 165*  BUN 27* 33*  CREATININE 5.85* 6.37*  CALCIUM 8.6* 8.6*    MICRO No results found for this or any previous visit (from the past 240 hour(s)).    Lab Results  Component Value Date   PTH 709.7 (H) 05/29/2013   CALCIUM 8.6 (L) 07/09/2016   CAION 1.07 (L) 09/08/2015   PHOS 4.9 (H) 07/09/2016               Impression: 1)Renal ESRd on HD On MWF schedule Pt will bedialyzed today  2)HTN BP at goal  Medication- On vasodilators On Central Acting Sympatholytics-  3)Anemia in ESRD the goal fpr HGb is 9--11. Pt HGb at goal Will keep on epo during hd   4)CKD Mineral-Bone Disorder Secondary Hyperparathyroidism present On Cinacalcet Phosphorus not at goal. On binders Calcium is at goal  5)Resp-Admitted with pneumonia. On antibiotics Primary MD following  6)Electrolytes   Normokalemic  hyponatremic  sec to ESRD  7)Acid base Co2 at goal    Plan:  Pt being  dialyzed today.     Tanieka Pownall S 07/10/2016, 9:00 AM

## 2016-07-10 NOTE — Progress Notes (Signed)
Patient discharged with instructions, prescription, and care notes.  Verbalized understanding via teach back.  IV was removed and the site was WNL. Patient voiced no further complaints or concerns at the time of discharge.  Appointments scheduled per instructions.  Patient left the floor via w/c family  And staff in stable condition. 

## 2016-07-10 NOTE — Discharge Summary (Signed)
Shawna [name] [name]:[id number] DOB: 06/15/1941 DOA: 07/08/2016  PCP: [name], MD  Admit date: 07/08/2016  Discharge date: 07/10/2016  Admitted From: Home   Disposition:  Home   Recommendations for Outpatient Follow-up:   Follow up with PCP in 1-2 weeks  PCP Please obtain BMP/CBC, 2 view CXR in 1week,  (see Discharge instructions)   PCP Please follow up on the following pending results: BP monitor   Home Health: None   Equipment/Devices: None  Consultations: Renal Discharge Condition: Stable   CODE STATUS: Full   Diet : Renal with 1.2 L daily fluid restriction   Chief Complaint  Patient presents with  . Shawna [name] [name]     Brief history of present illness from the day of admission and additional interim summary    Shawna [name] a 75 y.o.femalewith history of ESRD on hemodialysis on Monday Wednesday and [name], Shawna [name] [name], Shawna [name] status post CABG and stenting and cardiac arrest last month in the setting of hyperkalemia was referred to the ER after patient was found to be having persistently elevated blood pressure at the dialysis.   Her workup in the ER was consistent with hypertensive urgency/crisis and questionable pneumonia.  Hospital issues addressed     1.Hypertensive urgency/emergency. Much improved on nitroglycerin infusion, now off nitroglycerin drip after her blood pressure medications were titrated as below to a combination of Catapres, hydralazine and Imdur at below mentioned doses. Currently symptom-free. PCP to check blood pressure periodically and adjust medications.  2. ESRD. Dialysis on Monday Wednesday [name], be dialyzed today 07/10/2016 prior to discharge.  3. History of PSVT, V. tach in the ER 11 beat and nonsustained. Asymptomatic V. tach, magnesium was low and replaced, is on  amiodarone which will be continued. Recent EF 60%. No further recurrence.  4. Anemia of chronic disease. Stable.  5. History of stroke in the past. Continue aspirin and statin for secondary prevention.  6. ? pneumonia. Atelectasis clinically. No antibiotics needed. No symptoms.  Discharge diagnosis     Principal Problem:   Hypertensive emergency Active Problems:   ESRD on hemodialysis (HCC)   Hx of CABG   Chronic diastolic CHF (congestive heart failure) ([place])   Hypertensive crisis    Discharge instructions    Discharge Instructions    Discharge instructions    Complete by:  As directed    Follow with Primary MD [name], MD in 7 days   Get CBC, CMP, 2 view Chest X ray checked  by Primary MD or SNF MD in 5-7 days ( we routinely change or add medications that can affect your baseline labs and fluid status, therefore we recommend that you get the mentioned basic workup next visit with your PCP, your PCP may decide not to get them or add new tests based on their clinical decision)   Activity: As tolerated with Full fall precautions use walker/cane & assistance as needed   Disposition Home     Diet:   Renal  Check your Weight same time everyday, if you gain over  2 pounds, or you develop in leg swelling, experience more shortness of breath or chest pain, call your Primary MD immediately. Follow Cardiac Low Salt Diet and 1.2 lit/day fluid restriction.   On your next visit with your primary care physician please Get Medicines reviewed and adjusted.   Please request your Prim.MD to go over all Hospital Tests and Procedure/Radiological results at the follow up, please get all Hospital records sent to your Prim MD by signing hospital release before you go home.   If you experience worsening of your admission symptoms, develop shortness of breath, life threatening emergency, suicidal or homicidal thoughts you must seek medical attention immediately by calling 911 or calling  your MD immediately  if symptoms less severe.  You Must read complete instructions/literature along with all the possible adverse reactions/side effects for all the Medicines you take and that have been prescribed to you. Take any new Medicines after you have completely understood and accpet all the possible adverse reactions/side effects.   Do not drive, operate heavy machinery, perform activities at heights, swimming or participation in water activities or provide baby sitting services if your were admitted for syncope or siezures until you have seen by Primary MD or a Neurologist and advised to do so again.  Do not drive when taking Pain medications.    Do not take more than prescribed Pain, Sleep and Anxiety Medications  Special Instructions: If you have smoked or chewed Tobacco  in the last 2 yrs please stop smoking, stop any regular Alcohol  and or any Recreational drug use.  Wear Seat belts while driving.   Please note  You were cared for by a hospitalist during your hospital stay. If you have any questions about your discharge medications or the care you received while you were in the hospital after you are discharged, you can call the unit and asked to speak with the hospitalist on call if the hospitalist that took care of you is not available. Once you are discharged, your primary care physician will handle any further medical issues. Please note that NO REFILLS for any discharge medications will be authorized once you are discharged, as it is imperative that you return to your primary care physician (or establish a relationship with a primary care physician if you do not have one) for your aftercare needs so that they can reassess your need for medications and monitor your lab values.   Increase activity slowly    Complete by:  As directed       Discharge Medications   Medication List    TAKE these medications   ALPRAZolam 0.25 MG tablet Commonly known as:  XANAX Take 1-2  tablets by mouth at bedtime as needed for sleep.   amiodarone 200 MG tablet Commonly known as:  PACERONE Take 1 tablet (200 mg total) by mouth daily.   aspirin EC 81 MG tablet Take 81 mg by mouth daily.   cloNIDine 0.2 MG tablet Commonly known as:  CATAPRES Take 1 tablet (0.2 mg total) by mouth 3 (three) times daily. What changed:  medication strength  how much to take  when to take this   epoetin alfa 10000 UNIT/ML injection Commonly known as:  EPOGEN,PROCRIT Inject 1 mL (10,000 Units total) into the vein every Monday, Wednesday, and [name] with hemodialysis.   fluticasone 50 MCG/ACT nasal spray Commonly known as:  FLONASE Place 2 sprays into the nose daily as needed for allergies.   hydrALAZINE 50 MG tablet Commonly known as:  APRESOLINE Take 1 tablet (50 mg total) by mouth every 8 (eight) hours.   isosorbide mononitrate 60 MG 24 hr tablet Commonly known as:  IMDUR Take 1 tablet (60 mg total) by mouth daily.   lanthanum 1000 MG chewable tablet Commonly known as:  FOSRENOL Chew 1 tablet (1,000 mg total) by mouth 3 (three) times daily with meals.   multivitamin Tabs tablet Take 1 tablet by mouth daily.   nitroGLYCERIN 0.4 MG SL tablet Commonly known as:  NITROSTAT Place 1 tablet (0.4 mg total) under the tongue every 5 (five) minutes as needed for chest pain.   omeprazole 20 MG capsule Commonly known as:  PRILOSEC TAKE ONE CAPSULE BY MOUTH ONCE DAILY   PROAIR HFA 108 (90 Base) MCG/ACT inhaler Generic drug:  albuterol Inhale 1 puff into the lungs every 6 (six) hours as needed for wheezing or shortness of breath.   SENSIPAR 30 MG tablet Generic drug:  cinacalcet Take 30 mg by mouth daily with supper.   simvastatin 20 MG tablet Commonly known as:  ZOCOR Take 1 tablet (20 mg total) by mouth at bedtime.       Follow-up Information    [name], MD Follow up in 1 week(s).   Specialty:  Family Medicine Why:  ED follow up for further BP control Contact  information: [place] [address] [phone]        [name], MD Follow up.   Specialty:  Family Medicine Contact information: [place] [address] [phone]           Major procedures and Radiology Reports - PLEASE review detailed and final reports thoroughly  -         Dg Chest 2 View  Result Date: 07/10/2016 CLINICAL DATA:  Shortness of breath. EXAM: CHEST  2 VIEW COMPARISON:  Radiograph of July 08, 2016. FINDINGS: Stable cardiomediastinal silhouette. Status post coronary artery bypass graft. Atherosclerosis of thoracic aorta is noted. No pneumothorax is noted. Stable bibasilar subsegmental atelectasis is noted with associated pleural effusions. Bony thorax is unremarkable. IMPRESSION: Stable bibasilar subsegmental atelectasis with associated pleural effusions. Aortic atherosclerosis. Electronically Signed   By: Marijo Conception, M.D.   On: 07/10/2016 08:34   Ct Head Wo Contrast  Result Date: 07/08/2016 CLINICAL DATA:  Elevated blood pressure after dialysis currently with headache. No known injury. EXAM: CT HEAD WITHOUT CONTRAST TECHNIQUE: Contiguous axial images were obtained from the base of the skull through the vertex without intravenous contrast. COMPARISON:  Head CTs from 05/19/2016 and 03/09/2016 FINDINGS: Brain: Chronic mild cerebral atrophy without ventricular dilatation. Chronic small vessel ischemic disease of the white matter noted bilaterally with chronic left lacunar infarct in the left putamen/external capsule. No acute intracranial hemorrhage, midline shift or mass. No extra-axial fluid collection. No effacement of the basal cisterns. Vascular: Vascular calcifications in the intracranial carotid arteries and both vertebral arteries. Skull: No acute osseous abnormality. Sinuses/Orbits: Mucous retention cyst in the sphenoid. Orbits are nonsuspicious mastoids appear clear. Other: None IMPRESSION: Chronic mild cerebral atrophy with small  vessel ischemic disease of periventricular white matter. Chronic left putamenal/external capsule lacunar infarct. No acute intracranial abnormality noted. Electronically Signed   By: Ashley Royalty M.D.   On: 07/08/2016 21:14   Dg Chest Port 1 View  Result Date: 07/09/2016 CLINICAL DATA:  Dyspnea and cough. EXAM: PORTABLE CHEST 1 VIEW COMPARISON:  06/15/2016 CXR FINDINGS: Patient is status post median sternotomy. There is stable cardiomegaly. The aorta is atherosclerotic without  aneurysm. Bibasilar atelectasis right greater than left is noted. Slightly more confluent appearing airspace opacities at the right lung base cannot exclude pneumonia. No acute osseous appearing abnormality. IMPRESSION: Cardiomegaly with aortic atherosclerosis. Bibasilar atelectasis with findings suspicious for superimposed right lower lobe pneumonia. Electronically Signed   By: Ashley Royalty M.D.   On: 07/09/2016 00:51    Micro Results     No results found for this or any previous visit (from the past 240 hour(s)).  Today   Subjective    Shawna [name] [name] today has no headache,no chest abdominal pain,no new weakness tingling or numbness, feels much better wants to go home today.     Objective   Blood pressure (!) 173/68, pulse 60, temperature 98.2 F (36.8 C), temperature source Oral, resp. rate 18, height 5\' 2"  (1.575 m), weight 58 kg (127 lb 13.9 oz), SpO2 94 %.   Intake/Output Summary (Last 24 hours) at 07/10/16 1102 Last data filed at 07/09/16 1758  Gross per 24 hour  Intake              470 ml  Output                0 ml  Net              470 ml    Exam Awake Alert, Oriented x 3, No new F.N deficits, Normal affect [place].AT,PERRAL Supple Neck,No JVD, No cervical lymphadenopathy appriciated.  Symmetrical Chest wall movement, Good air movement bilaterally, CTAB RRR,No Gallops,Rubs or new Murmurs, No Parasternal Heave +ve B.Sounds, Abd Soft, Non tender, No organomegaly appriciated, No rebound -guarding or  rigidity. No Cyanosis, Clubbing or edema, No new Rash or bruise   Data Review   CBC w Diff: Lab Results  Component Value Date   WBC 6.0 07/09/2016   HGB 10.8 (L) 07/09/2016   HCT 34.6 (L) 07/09/2016   PLT 246 07/09/2016   LYMPHOPCT 16 07/09/2016   MONOPCT 13 07/09/2016   EOSPCT 10 07/09/2016   BASOPCT 1 07/09/2016    CMP: Lab Results  Component Value Date   NA 133 (L) 07/09/2016   K 3.8 07/09/2016   CL 93 (L) 07/09/2016   CO2 27 07/09/2016   BUN 33 (H) 07/09/2016   CREATININE 6.37 (H) 07/09/2016   PROT 5.6 (L) 07/09/2016   ALBUMIN 2.9 (L) 07/09/2016   BILITOT 0.5 07/09/2016   ALKPHOS 70 07/09/2016   AST 23 07/09/2016   ALT 13 (L) 07/09/2016  .   Total Time in preparing paper work, data evaluation and todays exam - 35 minutes  Thurnell Lose M.D on 07/10/2016 at 11:02 AM  [facility]   Office  [phone]

## 2016-07-10 NOTE — Procedures (Signed)
    HEMODIALYSIS TREATMENT NOTE:  3.25 hour heparin-free dialysis completed via left upper arm AVF (16g/antegrade). Goal met: 2.5 liters removed.  Pt verbalized at the onset of treatment that she only intended to run her "regular time" of 3 hours and 15 minutes.  Dr. Theador Hawthorne was notified. Hypertensive throughout HD session with SBP 190s-200s.  All anti-hypertensives were held prior to HD; to be given post-HD.  All blood returned and hemostasis was achieved within 10 minutes. Report called to Sharen Hones, RN   Rockwell Alexandria, RN, CDN

## 2016-07-10 NOTE — Discharge Instructions (Signed)
Follow with Primary MD [name], MD in 7 days   Get CBC, CMP, 2 view Chest X ray checked  by Primary MD or SNF MD in 5-7 days ( we routinely change or add medications that can affect your baseline labs and fluid status, therefore we recommend that you get the mentioned basic workup next visit with your PCP, your PCP may decide not to get them or add new tests based on their clinical decision)   Activity: As tolerated with Full fall precautions use walker/cane & assistance as needed   Disposition Home     Diet:   Renal  Check your Weight same time everyday, if you gain over 2 pounds, or you develop in leg swelling, experience more shortness of breath or chest pain, call your Primary MD immediately. Follow Cardiac Low Salt Diet and 1.2 lit/day fluid restriction.   On your next visit with your primary care physician please Get Medicines reviewed and adjusted.   Please request your Prim.MD to go over all Hospital Tests and Procedure/Radiological results at the follow up, please get all Hospital records sent to your Prim MD by signing hospital release before you go home.   If you experience worsening of your admission symptoms, develop shortness of breath, life threatening emergency, suicidal or homicidal thoughts you must seek medical attention immediately by calling 911 or calling your MD immediately  if symptoms less severe.  You Must read complete instructions/literature along with all the possible adverse reactions/side effects for all the Medicines you take and that have been prescribed to you. Take any new Medicines after you have completely understood and accpet all the possible adverse reactions/side effects.   Do not drive, operate heavy machinery, perform activities at heights, swimming or participation in water activities or provide baby sitting services if your were admitted for syncope or siezures until you have seen by Primary MD or a Neurologist and advised to do so  again.  Do not drive when taking Pain medications.    Do not take more than prescribed Pain, Sleep and Anxiety Medications  Special Instructions: If you have smoked or chewed Tobacco  in the last 2 yrs please stop smoking, stop any regular Alcohol  and or any Recreational drug use.  Wear Seat belts while driving.   Please note  You were cared for by a hospitalist during your hospital stay. If you have any questions about your discharge medications or the care you received while you were in the hospital after you are discharged, you can call the unit and asked to speak with the hospitalist on call if the hospitalist that took care of you is not available. Once you are discharged, your primary care physician will handle any further medical issues. Please note that NO REFILLS for any discharge medications will be authorized once you are discharged, as it is imperative that you return to your primary care physician (or establish a relationship with a primary care physician if you do not have one) for your aftercare needs so that they can reassess your need for medications and monitor your lab values.

## 2016-07-13 DIAGNOSIS — N186 End stage renal disease: Secondary | ICD-10-CM | POA: Diagnosis not present

## 2016-07-13 DIAGNOSIS — Z992 Dependence on renal dialysis: Secondary | ICD-10-CM | POA: Diagnosis not present

## 2016-07-13 LAB — HEPATITIS B SURFACE ANTIGEN: Hepatitis B Surface Ag: NEGATIVE

## 2016-07-13 LAB — HEPATITIS B SURFACE ANTIBODY, QUANTITATIVE: Hep B S AB Quant (Post): 65.1 m[IU]/mL (ref >9.9)

## 2016-07-13 NOTE — Progress Notes (Signed)
Per Dr. Bronson Ing - will follow up at [facility] this Friday.    Attempted to notify 07/13/2016 - 1:05 - no answer.

## 2016-07-14 DIAGNOSIS — N186 End stage renal disease: Secondary | ICD-10-CM | POA: Diagnosis not present

## 2016-07-14 DIAGNOSIS — Z992 Dependence on renal dialysis: Secondary | ICD-10-CM | POA: Diagnosis not present

## 2016-07-15 ENCOUNTER — Encounter: Payer: Self-pay | Admitting: Cardiovascular Disease

## 2016-07-15 ENCOUNTER — Ambulatory Visit (INDEPENDENT_AMBULATORY_CARE_PROVIDER_SITE_OTHER): Payer: Medicare HMO | Admitting: Cardiovascular Disease

## 2016-07-15 VITALS — BP 131/75 | HR 82 | Ht 61.0 in | Wt 125.0 lb

## 2016-07-15 DIAGNOSIS — E7800 Pure hypercholesterolemia, unspecified: Secondary | ICD-10-CM

## 2016-07-15 DIAGNOSIS — N186 End stage renal disease: Secondary | ICD-10-CM | POA: Diagnosis not present

## 2016-07-15 DIAGNOSIS — Z951 Presence of aortocoronary bypass graft: Secondary | ICD-10-CM

## 2016-07-15 DIAGNOSIS — I10 Essential (primary) hypertension: Secondary | ICD-10-CM | POA: Diagnosis not present

## 2016-07-15 DIAGNOSIS — I5032 Chronic diastolic (congestive) heart failure: Secondary | ICD-10-CM | POA: Diagnosis not present

## 2016-07-15 DIAGNOSIS — Z9289 Personal history of other medical treatment: Secondary | ICD-10-CM

## 2016-07-15 DIAGNOSIS — I6523 Occlusion and stenosis of bilateral carotid arteries: Secondary | ICD-10-CM

## 2016-07-15 DIAGNOSIS — Z992 Dependence on renal dialysis: Secondary | ICD-10-CM | POA: Diagnosis not present

## 2016-07-15 DIAGNOSIS — I25768 Atherosclerosis of bypass graft of coronary artery of transplanted heart with other forms of angina pectoris: Secondary | ICD-10-CM

## 2016-07-15 NOTE — Patient Instructions (Signed)
Medication Instructions:  Continue all current medications.  Labwork: none  Testing/Procedures: none  Follow-Up: 3 months   Any Other Special Instructions Will Be Listed Below (If Applicable).  If you need a refill on your cardiac medications before your next appointment, please call your pharmacy.  

## 2016-07-15 NOTE — Progress Notes (Signed)
SUBJECTIVE: The patient presents for follow-up after being hospitalized in late 06/2016 for hypertensive urgency/emergency. She had some nonsustained ventricular tachycardia as well.  Discharge medications included clonidine 0.2 mg three times a day, hydralazine 50 mg three times a day, amlodipine 2.5 mg daily, and Imdur 60 mg daily.  She was hospitalized in November 2017 for bradycardia with PEA/cardiac arrest and underwent CPR. Potassium was 6.4 at that time. She was in acute hypoxic respiratory failure with hypertensive crisis with chest pain as well.  She is doing well today. She denies chest pain.  Review of Systems: As per "subjective", otherwise negative.    Current Outpatient Prescriptions  Medication Sig Dispense Refill  . ALPRAZolam (XANAX) 0.25 MG tablet Take 1-2 tablets by mouth at bedtime as needed for sleep.     Marland Kitchen amiodarone (PACERONE) 200 MG tablet Take 1 tablet (200 mg total) by mouth daily. 30 tablet 3  . amLODipine (NORVASC) 2.5 MG tablet Take 2.5 mg by mouth daily.    Marland Kitchen aspirin EC 81 MG tablet Take 81 mg by mouth daily.     . cloNIDine (CATAPRES) 0.2 MG tablet Take 1 tablet (0.2 mg total) by mouth 3 (three) times daily. 90 tablet 0  . epoetin alfa (EPOGEN,PROCRIT) 12751 UNIT/ML injection Inject 1 mL (10,000 Units total) into the vein every Monday, Wednesday, and Friday with hemodialysis. 1 mL 10  . fluticasone (FLONASE) 50 MCG/ACT nasal spray Place 2 sprays into the nose daily as needed for allergies.     . hydrALAZINE (APRESOLINE) 50 MG tablet Take 1 tablet (50 mg total) by mouth every 8 (eight) hours. 90 tablet 0  . isosorbide mononitrate (IMDUR) 60 MG 24 hr tablet Take 1 tablet (60 mg total) by mouth daily. 30 tablet 0  . lanthanum (FOSRENOL) 1000 MG chewable tablet Chew 1 tablet (1,000 mg total) by mouth 3 (three) times daily with meals. 90 tablet 1  . multivitamin (RENA-VIT) TABS tablet Take 1 tablet by mouth daily.    Marland Kitchen omeprazole (PRILOSEC) 20 MG capsule  TAKE ONE CAPSULE BY MOUTH ONCE DAILY 30 capsule 5  . PROAIR HFA 108 (90 BASE) MCG/ACT inhaler Inhale 1 puff into the lungs every 6 (six) hours as needed for wheezing or shortness of breath.     . SENSIPAR 30 MG tablet Take 30 mg by mouth daily with supper.     . simvastatin (ZOCOR) 20 MG tablet Take 1 tablet (20 mg total) by mouth at bedtime. 90 tablet 3  . nitroGLYCERIN (NITROSTAT) 0.4 MG SL tablet Place 1 tablet (0.4 mg total) under the tongue every 5 (five) minutes as needed for chest pain. 25 tablet 3   No current facility-administered medications for this visit.     Past Medical History:  Diagnosis Date  . Anxiety   . Arthritis   . AVM (arteriovenous malformation) of colon   . CAD (coronary artery disease)    a. 12/2011 NSTEMI/Cath/PCI LCX (2.25x14 Resolute DES) & D1 (2.25x22 Resolute DES);  b. 01/2012 Cath/PCI: LM 30, LAD 30p, 40-50m, D1 stent ok, 99 in sm branch of diag, LCX patent stent, OM1 20, RCA 95 ost (4.0x12 Promus DES), EF 55%;  c. 04/2012 Lexi Cardiolite  EF 48%, small area of scar @ base/mid inflat wall with mild peri-infarct ischemia.; CABG 12/4  . Carotid artery disease ([place])    a. 70-01% LICA, 01/4943   . Chronic bronchitis ([place])   . Chronic diastolic CHF (congestive heart failure) ([place])  a. 02/2012 Echo EF 60-65%, nl wall motion, Gr 1 DD, mod MR  . Colon cancer ([place]) 1992  . Esophageal stricture   . ESRD on hemodialysis ([place])    ESRD due to HTN, started dialysis 2011 and gets HD at [name] with Dr Hinda Lenis on MWF schedule.  Access is LUA AVF as of Sept 2014.   Marland Kitchen GERD (gastroesophageal reflux disease)   . High cholesterol 12/2011  . History of blood transfusion 07/2011; 12/2011; 01/2012 X 2; 04/2012  . History of gout   . History of lower GI bleeding   . Hypertension   . Iron deficiency anemia   . Mitral regurgitation    a. Moderate by echo, 02/2012  . Myocardial infarction   . Ovarian cancer ([place]) 1992  . Pneumonia ~ 2009  . PUD (peptic ulcer disease)   . TIA  (transient ischemic attack)     Past Surgical History:  Procedure Laterality Date  . ABDOMINAL HYSTERECTOMY  1992  . APPENDECTOMY  06/1990  . AV FISTULA PLACEMENT  07/2009   left upper arm  . COLON RESECTION  1992  . CORONARY ANGIOPLASTY WITH STENT PLACEMENT  12/15/11   "2"  . CORONARY ANGIOPLASTY WITH STENT PLACEMENT  y/2013   "1; makes total of 3" (05/02/2012)  . CORONARY ARTERY BYPASS GRAFT  06/13/2012   Procedure: CORONARY ARTERY BYPASS GRAFTING (CABG);  Surgeon: Grace Isaac, MD;  Location: [place];  Service: Open Heart Surgery;  Laterality: N/A;  cabg x four;  using left internal mammary artery, and left leg greater saphenous vein harvested endoscopically  . DILATION AND CURETTAGE OF UTERUS    . ESOPHAGOGASTRODUODENOSCOPY  01/20/2012   Procedure: ESOPHAGOGASTRODUODENOSCOPY (EGD);  Surgeon: Ladene Artist, MD,FACG;  Location: [facility] ENDOSCOPY;  Service: Endoscopy;  Laterality: N/A;  . ESOPHAGOGASTRODUODENOSCOPY N/A 03/26/2013   Procedure: ESOPHAGOGASTRODUODENOSCOPY (EGD);  Surgeon: Irene Shipper, MD;  Location: [facility] ENDOSCOPY;  Service: Endoscopy;  Laterality: N/A;  . ESOPHAGOGASTRODUODENOSCOPY N/A 04/30/2015   Procedure: ESOPHAGOGASTRODUODENOSCOPY (EGD);  Surgeon: Rogene Houston, MD;  [facility];  Service: Endoscopy;  Laterality: N/A;  1pm - moved to 10/20 @ 1:10  . INTRAOPERATIVE TRANSESOPHAGEAL ECHOCARDIOGRAM  06/13/2012   Procedure: INTRAOPERATIVE TRANSESOPHAGEAL ECHOCARDIOGRAM;  Surgeon: Grace Isaac, MD;  Location: [place];  Service: Open Heart Surgery;  Laterality: N/A;  . LEFT HEART CATHETERIZATION WITH CORONARY ANGIOGRAM N/A 12/15/2011   Procedure: LEFT HEART CATHETERIZATION WITH CORONARY ANGIOGRAM;  Surgeon: Burnell Blanks, MD;  Location: [facility] CATH LAB;  Service: Cardiovascular;  Laterality: N/A;  . LEFT HEART CATHETERIZATION WITH CORONARY ANGIOGRAM N/A 01/10/2012   Procedure: LEFT HEART CATHETERIZATION WITH CORONARY ANGIOGRAM;  Surgeon: Peter M Martinique, MD;   Location[facility] CATH LAB;  Service: Cardiovascular;  Laterality: N/A;  . LEFT HEART CATHETERIZATION WITH CORONARY ANGIOGRAM N/A 06/08/2012   Procedure: LEFT HEART CATHETERIZATION WITH CORONARY ANGIOGRAM;  Surgeon: Burnell Blanks, MD;  Location: [facility] CATH LAB;  Service: Cardiovascular;  Laterality: N/A;  . LEFT HEART CATHETERIZATION WITH CORONARY/GRAFT ANGIOGRAM N/A 12/10/2013   Procedure: LEFT HEART CATHETERIZATION WITH Beatrix Fetters;  Surgeon: [name], MD;  Location: [facility] CATH LAB;  Service: Cardiovascular;  Laterality: N/A;  . OVARY SURGERY     ovarian cancer  . SHUNTOGRAM N/A 10/15/2013   Procedure: Fistulogram;  Surgeon: Serafina Mitchell, MD;  Location: [facility] CATH LAB;  Service: Cardiovascular;  Laterality: N/A;  . THROMBECTOMY / ARTERIOVENOUS GRAFT REVISION  2011   left upper arm  . TUBAL LIGATION  5638'V    Social History   Social History  . Marital status: Married    Spouse name: N/A  . Number of children: N/A  . Years of education: N/A   Occupational History  . Not on file.   Social History Main Topics  . Smoking status: Never Smoker  . Smokeless tobacco: Never Used  . Alcohol use No  . Drug use: No  . Sexual activity: Yes    Birth control/ protection: Surgical   Other Topics Concern  . Not on file   Social History Narrative   Lives in [place], [place] with husband.  Dialysis pt - [facility].     Vitals:   07/15/16 1516  BP: 131/75  Pulse: 82  Weight: 125 lb (56.7 kg)  Height: 5\' 1"  (1.549 m)    PHYSICAL EXAM General: NAD HEENT: Normal. Neck: No JVD, no thyromegaly. Lungs: Clear to auscultation bilaterally with normal respiratory effort. CV: Nondisplaced PMI. Regular rate and rhythm, normal S1/S2, no S3/S4, soft aortic systolic murmur. No pretibial or periankle edema.  Abdomen: Soft, nontender, no distention.  Neurologic: Alert and oriented.  Psych: Normal affect. Skin: Normal. Musculoskeletal: No gross deformities.    ECG: Most recent ECG  reviewed.      ASSESSMENT AND PLAN:  1. CAD/CABG: Normal nuclear stress test (08/25/14). Continue aspirin, nitrates, and statin. No longer on labetalol.  2. Malignant HTN: Controlled. No changes.  3. Hyperlipidemia: Continue statin therapy.  4. Mitral regurgitation: Moderate in 09/2015. Will monitor.  5. Carotid artery stenosis: Stable. Continue surveillance monitoring.   6. PSVT: Stable on amiodarone. No changes.  Dispo: f/u 3 months.   Kate Sable, M.D., F.A.C.C.

## 2016-07-16 ENCOUNTER — Encounter (HOSPITAL_COMMUNITY): Payer: Self-pay | Admitting: *Deleted

## 2016-07-16 ENCOUNTER — Emergency Department (HOSPITAL_COMMUNITY)
Admission: EM | Admit: 2016-07-16 | Discharge: 2016-07-16 | Disposition: A | Discharge status: Home / Self Care | Payer: Medicare HMO | Attending: Emergency Medicine | Admitting: Emergency Medicine

## 2016-07-16 ENCOUNTER — Emergency Department (HOSPITAL_COMMUNITY): Payer: Medicare HMO

## 2016-07-16 DIAGNOSIS — J069 Acute upper respiratory infection, unspecified: Secondary | ICD-10-CM | POA: Diagnosis not present

## 2016-07-16 DIAGNOSIS — I120 Hypertensive chronic kidney disease with stage 5 chronic kidney disease or end stage renal disease: Secondary | ICD-10-CM | POA: Diagnosis not present

## 2016-07-16 DIAGNOSIS — N186 End stage renal disease: Secondary | ICD-10-CM | POA: Insufficient documentation

## 2016-07-16 DIAGNOSIS — R0789 Other chest pain: Secondary | ICD-10-CM

## 2016-07-16 DIAGNOSIS — Z79899 Other long term (current) drug therapy: Secondary | ICD-10-CM | POA: Insufficient documentation

## 2016-07-16 DIAGNOSIS — I5032 Chronic diastolic (congestive) heart failure: Secondary | ICD-10-CM | POA: Diagnosis not present

## 2016-07-16 DIAGNOSIS — Z992 Dependence on renal dialysis: Secondary | ICD-10-CM | POA: Diagnosis not present

## 2016-07-16 DIAGNOSIS — I10 Essential (primary) hypertension: Secondary | ICD-10-CM

## 2016-07-16 DIAGNOSIS — R05 Cough: Secondary | ICD-10-CM | POA: Insufficient documentation

## 2016-07-16 DIAGNOSIS — I2510 Atherosclerotic heart disease of native coronary artery without angina pectoris: Secondary | ICD-10-CM | POA: Diagnosis not present

## 2016-07-16 DIAGNOSIS — I132 Hypertensive heart and chronic kidney disease with heart failure and with stage 5 chronic kidney disease, or end stage renal disease: Secondary | ICD-10-CM | POA: Diagnosis not present

## 2016-07-16 DIAGNOSIS — Z7982 Long term (current) use of aspirin: Secondary | ICD-10-CM | POA: Diagnosis not present

## 2016-07-16 LAB — COMPREHENSIVE METABOLIC PANEL
ALT: 11 U/L — ABNORMAL LOW (ref 14–54)
ANION GAP: 16 — AB (ref 5–15)
AST: 17 U/L (ref 15–41)
Albumin: 3.7 g/dL (ref 3.5–5.0)
Alkaline Phosphatase: 77 U/L (ref 38–126)
BUN: 44 mg/dL — ABNORMAL HIGH (ref 6–20)
CHLORIDE: 98 mmol/L — AB (ref 101–111)
CO2: 23 mmol/L (ref 22–32)
CREATININE: 7.82 mg/dL — AB (ref 0.44–1.00)
Calcium: 9.2 mg/dL (ref 8.9–10.3)
GFR, EST AFRICAN AMERICAN: 5 mL/min — AB (ref >60)
GFR, EST NON AFRICAN AMERICAN: 4 mL/min — AB (ref >60)
Glucose, Bld: 72 mg/dL (ref 65–99)
POTASSIUM: 4.6 mmol/L (ref 3.5–5.1)
SODIUM: 137 mmol/L (ref 135–145)
Total Bilirubin: 0.8 mg/dL (ref 0.3–1.2)
Total Protein: 7.1 g/dL (ref 6.5–8.1)

## 2016-07-16 LAB — CBC WITH DIFFERENTIAL/PLATELET
Basophils Absolute: 0.1 10*3/uL (ref 0.0–0.1)
Basophils Relative: 1 %
EOS ABS: 0.9 10*3/uL — AB (ref 0.0–0.7)
EOS PCT: 11 %
HCT: 37.1 % (ref 36.0–46.0)
Hemoglobin: 11.5 g/dL — ABNORMAL LOW (ref 12.0–15.0)
LYMPHS ABS: 1.6 10*3/uL (ref 0.7–4.0)
LYMPHS PCT: 20 %
MCH: 31.9 pg (ref 26.0–34.0)
MCHC: 31 g/dL (ref 30.0–36.0)
MCV: 103.1 fL — AB (ref 78.0–100.0)
MONO ABS: 0.5 10*3/uL (ref 0.1–1.0)
Monocytes Relative: 7 %
Neutro Abs: 4.9 10*3/uL (ref 1.7–7.7)
Neutrophils Relative %: 61 %
Platelets: 292 10*3/uL (ref 150–400)
RBC: 3.6 MIL/uL — ABNORMAL LOW (ref 3.87–5.11)
RDW: 17.2 % — AB (ref 11.5–15.5)
WBC: 7.9 10*3/uL (ref 4.0–10.5)

## 2016-07-16 LAB — TROPONIN I
TROPONIN I: 0.04 ng/mL — AB (ref <0.03)
TROPONIN I: 0.04 ng/mL — AB (ref <0.03)

## 2016-07-16 LAB — BRAIN NATRIURETIC PEPTIDE: B NATRIURETIC PEPTIDE 5: 372 pg/mL — AB (ref 0.0–100.0)

## 2016-07-16 IMAGING — DX DG CHEST 2V
2 series · 2 of 2 positions shown · non-contrast
Comparison: [DATE]

CLINICAL DATA: Productive cough starting last night

EXAM:
CHEST  2 VIEW

[chest pa]
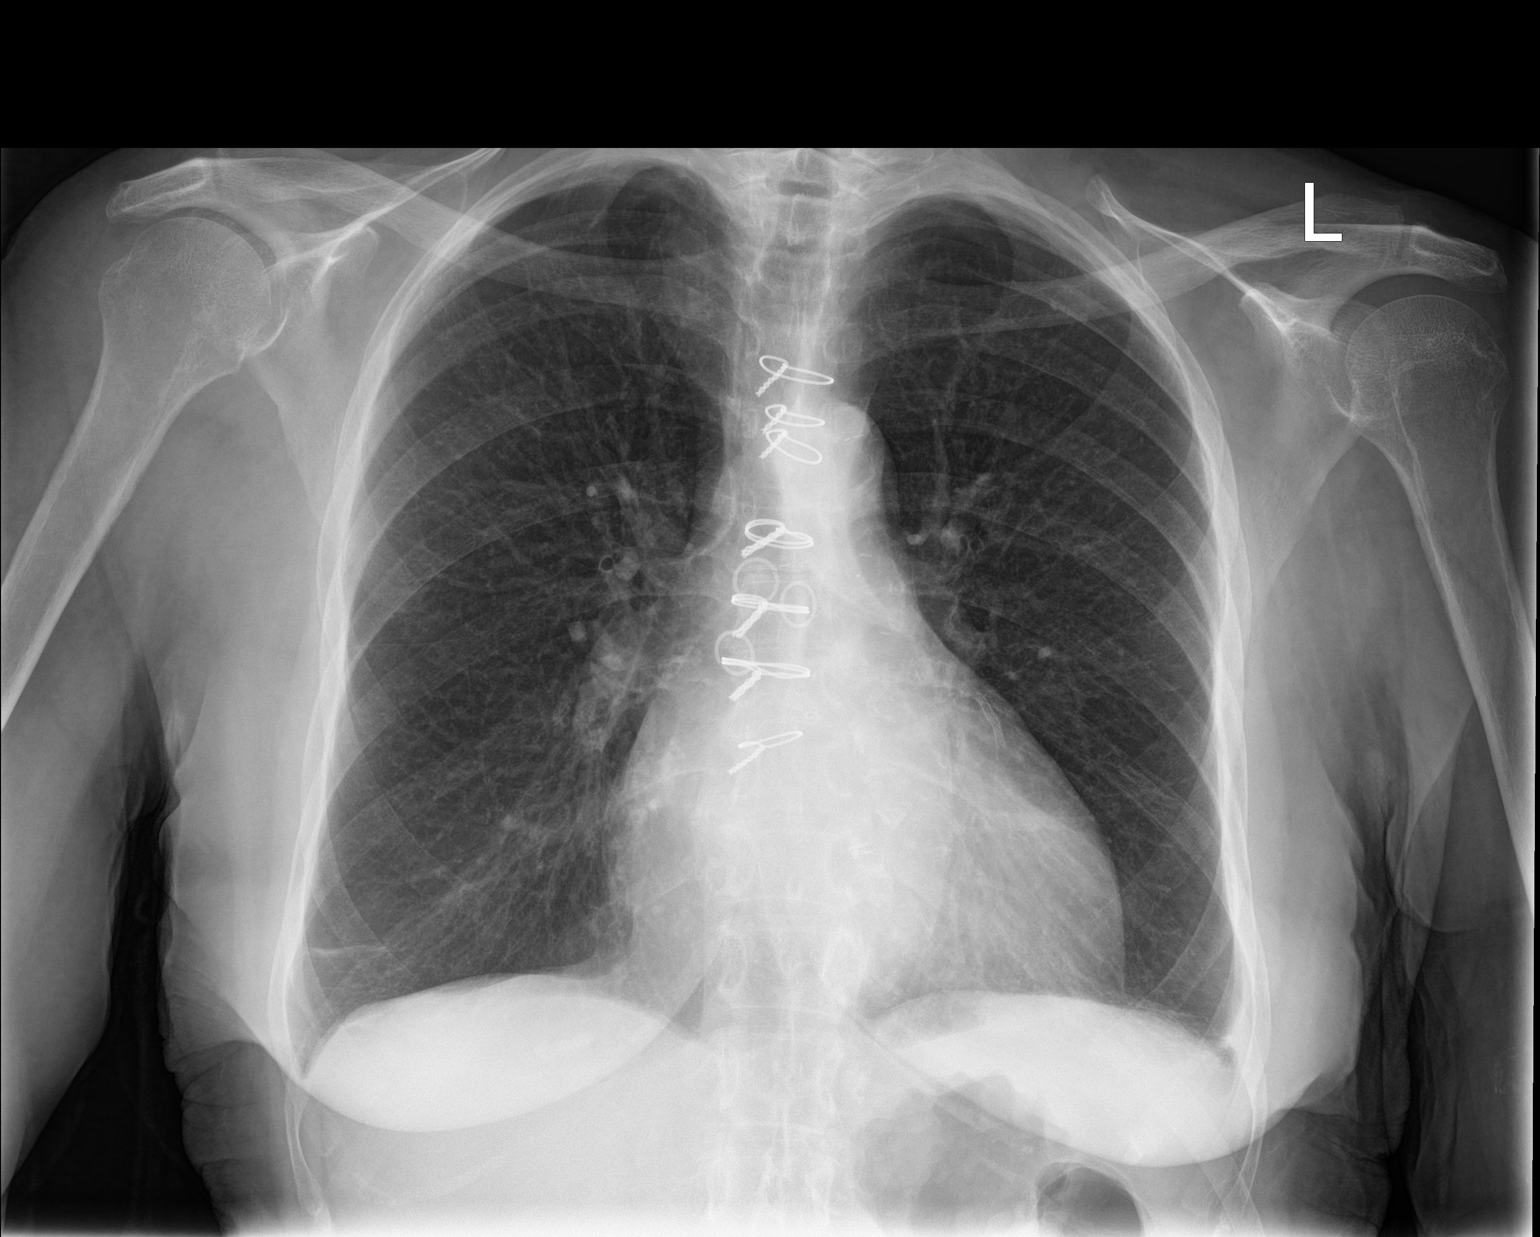

[chest lat]
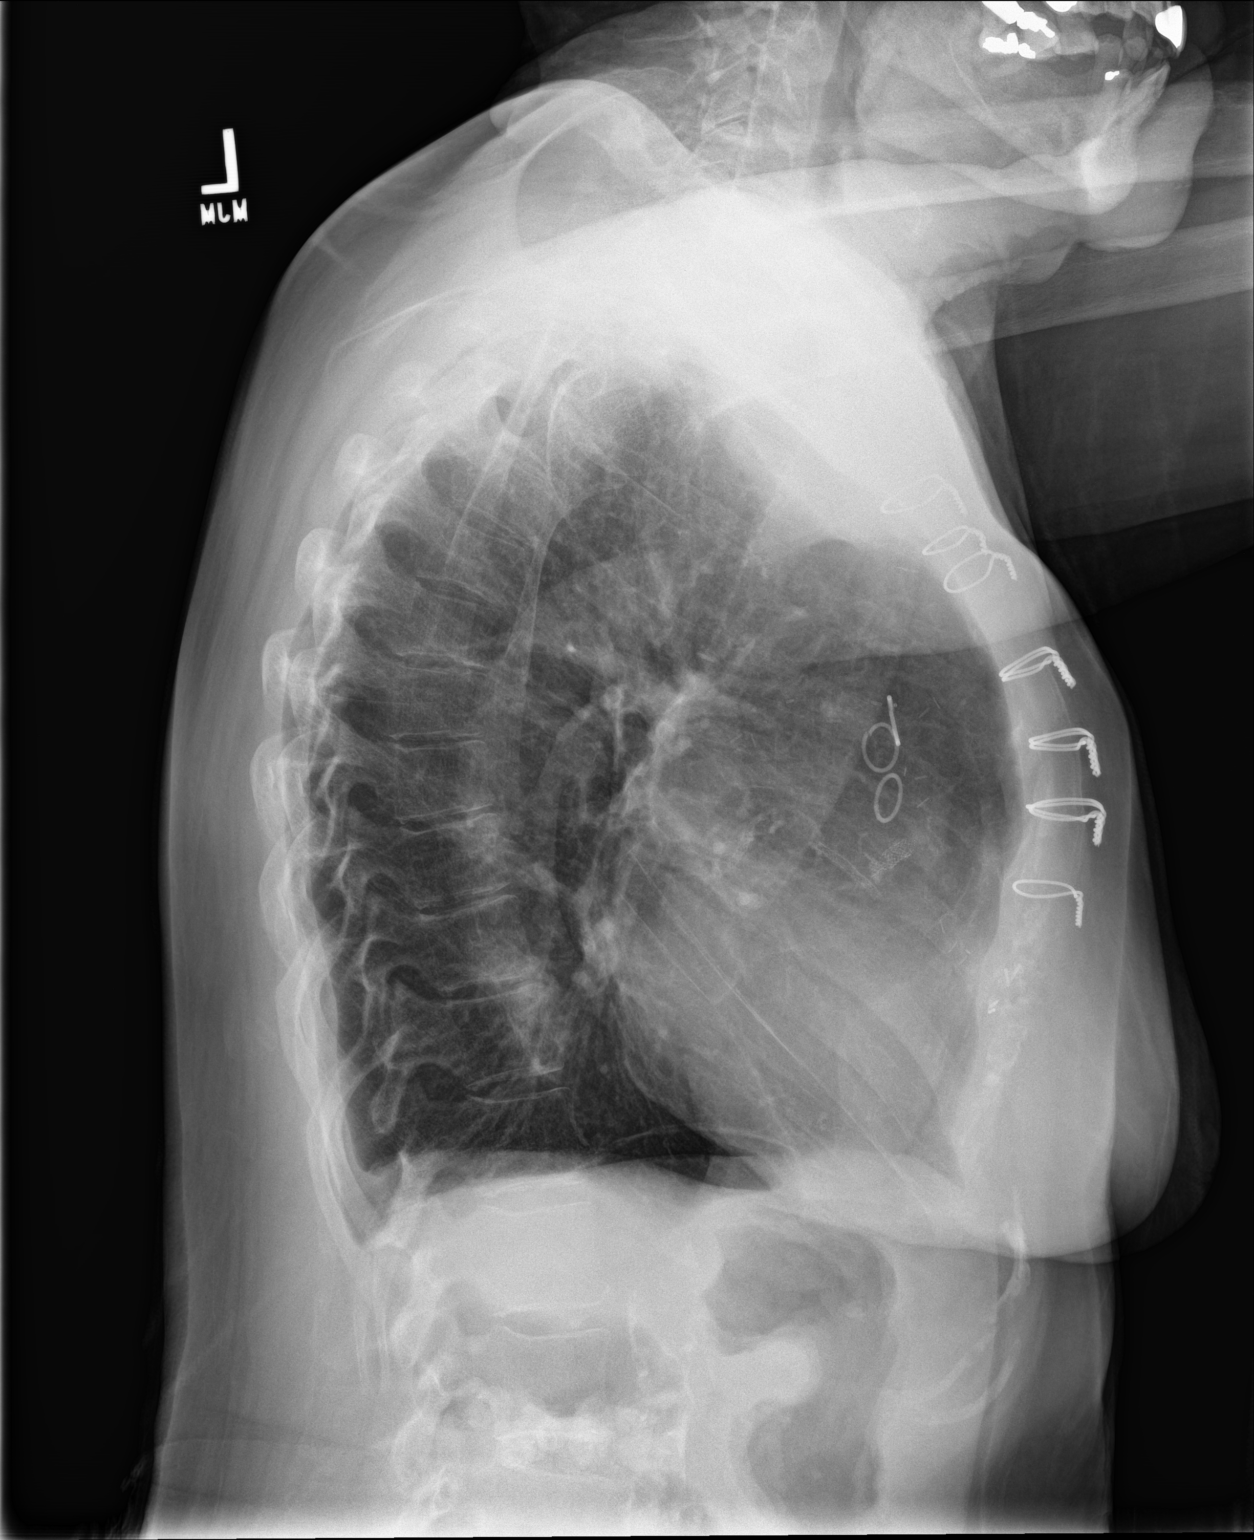

[2 of 2 positions shown; findings below may reference images not displayed]

FINDINGS: Mild cardiomegaly. Status post CABG with multiple coronary stents.
Mild scarring at the right base. Aeration of the bases has improved
since prior. There is no edema, consolidation, effusion, or
pneumothorax.
IMPRESSION: No evidence of acute disease.

## 2016-07-16 MED ORDER — CLONIDINE HCL 0.2 MG PO TABS
0.2000 mg | ORAL_TABLET | Freq: Once | ORAL | Status: AC
  Administered 2016-07-16: 0.2 mg via ORAL
  Filled 2016-07-16: qty 1

## 2016-07-16 MED ORDER — HYDRALAZINE HCL 50 MG PO TABS
50.0000 mg | ORAL_TABLET | Freq: Once | ORAL | Status: AC
  Administered 2016-07-16: 50 mg via ORAL
  Filled 2016-07-16: qty 1

## 2016-07-16 MED ORDER — HYDRALAZINE HCL 25 MG PO TABS
ORAL_TABLET | ORAL | Status: AC
  Filled 2016-07-16: qty 2

## 2016-07-16 NOTE — ED Notes (Signed)
Received report on pt, pt updated on plan of care, denies any complaints,

## 2016-07-16 NOTE — ED Notes (Signed)
AC bringing hydralazine from pharmacy

## 2016-07-16 NOTE — ED Notes (Signed)
Lab at bedside

## 2016-07-16 NOTE — ED Provider Notes (Signed)
[facility] DEPT Provider Note   CSN: [id number] Arrival date & time: 07/16/16  1315     History   Chief Complaint Chief Complaint  Patient presents with  . Cough    HPI Shawna [name] is a 76 y.o. female.  HPI Patient presents with cough productive of white sputum starting yesterday evening. This is associated with pain across her upper back. She also describes pain especially in the right chest. She states the pain is worse with movement and deep breathing. He denies any fever or chills. No shortness of breath. No new lower extremity swelling or asymmetry. Was dialyzed yesterday. Past Medical History:  Diagnosis Date  . Anxiety   . Arthritis   . AVM (arteriovenous malformation) of colon   . CAD (coronary artery disease)    a. 12/2011 NSTEMI/Cath/PCI LCX (2.25x14 Resolute DES) & D1 (2.25x22 Resolute DES);  b. 01/2012 Cath/PCI: LM 30, LAD 30p, 40-12m, D1 stent ok, 99 in sm branch of diag, LCX patent stent, OM1 20, RCA 95 ost (4.0x12 Promus DES), EF 55%;  c. 04/2012 Lexi Cardiolite  EF 48%, small area of scar @ base/mid inflat wall with mild peri-infarct ischemia.; CABG 12/4  . Carotid artery disease ([place])    a. 56-43% LICA, 09/2949   . Chronic bronchitis ([name])   . Chronic diastolic CHF (congestive heart failure) ([place])    a. 02/2012 Echo EF 60-65%, nl wall motion, Gr 1 DD, mod MR  . Colon cancer ([place]) 1992  . Esophageal stricture   . ESRD on hemodialysis ([place])    ESRD due to HTN, started dialysis 2011 and gets HD at [facility] with Dr Hinda Lenis on MWF schedule.  Access is LUA AVF as of Sept 2014.   Marland Kitchen GERD (gastroesophageal reflux disease)   . High cholesterol 12/2011  . History of blood transfusion 07/2011; 12/2011; 01/2012 X 2; 04/2012  . History of gout   . History of lower GI bleeding   . Hypertension   . Iron deficiency anemia   . Mitral regurgitation    a. Moderate by echo, 02/2012  . Myocardial infarction   . Ovarian cancer ([place]) 1992  . Pneumonia ~ 2009  . PUD (peptic  ulcer disease)   . TIA (transient ischemic attack)     Patient Active Problem List   Diagnosis Date Noted  . Hypertensive emergency 07/08/2016  . Respiratory failure ([place])   . Cardiac arrest ([place])   . Palliative care encounter   . DNR (do not resuscitate) discussion   . Goals of care, counseling/discussion   . Hypertensive crisis without congestive heart failure 05/09/2016  . Acute pulmonary edema ([place]) 04/06/2016  . Acute respiratory failure ([place]) 04/06/2016  . Hypertensive crisis 01/27/2016  . History of colon cancer 01/27/2016  . History of ovarian cancer 01/27/2016  . Hypertensive urgency 01/27/2016  . Narrow complex tachycardia ([place]) 09/08/2015  . SVT (supraventricular tachycardia) ([place]) 09/08/2015  . Influenza A 08/30/2015  . Acute on chronic diastolic CHF (congestive heart failure) ([place]) 05/04/2015  . Unstable angina ([place]) 05/03/2015  . Essential hypertension   . Pain in joint, lower leg 08/14/2014  . Chest pain 11/26/2013  . Small bowel obstruction, partial 05/29/2013  . Chronic diastolic CHF (congestive heart failure) ([place]) 03/22/2013  . GI bleeding 03/21/2013  . Acute blood loss anemia 03/21/2013  . Vaginal odor 03/12/2013  . Vaginal discharge 03/12/2013  . Occlusion and stenosis of carotid artery without mention of cerebral infarction 01/24/2013  . Hx of CABG 07/05/2012  .  Carotid artery disease ([place]) 07/05/2012  . Anemia of chronic kidney failure 07/03/2012  . Secondary hyperparathyroidism ([place]) 07/03/2012  . Mitral regurgitation 06/12/2012  . Pneumonia 06/09/2012  . Non-STEMI (non-ST elevated myocardial infarction) ([place]) 06/08/2012  . GERD (gastroesophageal reflux disease) 01/09/2012  . HLD (hyperlipidemia) 01/05/2012  . Coronary atherosclerosis of native coronary artery 12/16/2011  . ESRD on hemodialysis ([place]) 12/16/2011    Past Surgical History:  Procedure Laterality Date  . ABDOMINAL HYSTERECTOMY  1992  . APPENDECTOMY  06/1990  . AV FISTULA PLACEMENT   07/2009   left upper arm  . COLON RESECTION  1992  . CORONARY ANGIOPLASTY WITH STENT PLACEMENT  12/15/11   "2"  . CORONARY ANGIOPLASTY WITH STENT PLACEMENT  y/2013   "1; makes total of 3" (05/02/2012)  . CORONARY ARTERY BYPASS GRAFT  06/13/2012   Procedure: CORONARY ARTERY BYPASS GRAFTING (CABG);  Surgeon: Grace Isaac, MD;  Location: [facility];  Service: Open Heart Surgery;  Laterality: N/A;  cabg x four;  using left internal mammary artery, and left leg greater saphenous vein harvested endoscopically  . DILATION AND CURETTAGE OF UTERUS    . ESOPHAGOGASTRODUODENOSCOPY  01/20/2012   Procedure: ESOPHAGOGASTRODUODENOSCOPY (EGD);  Surgeon: Ladene Artist, MD,FACG;  Location: [facility] ENDOSCOPY;  Service: Endoscopy;  Laterality: N/A;  . ESOPHAGOGASTRODUODENOSCOPY N/A 03/26/2013   Procedure: ESOPHAGOGASTRODUODENOSCOPY (EGD);  Surgeon: Irene Shipper, MD;  Location: [facility] ENDOSCOPY;  Service: Endoscopy;  Laterality: N/A;  . ESOPHAGOGASTRODUODENOSCOPY N/A 04/30/2015   Procedure: ESOPHAGOGASTRODUODENOSCOPY (EGD);  Surgeon: Rogene Houston, MD;  [facility]O [facility];  Service: Endoscopy;  Laterality: N/A;  1pm - moved to 10/20 @ 1:10  . INTRAOPERATIVE TRANSESOPHAGEAL ECHOCARDIOGRAM  06/13/2012   Procedure: INTRAOPERATIVE TRANSESOPHAGEAL ECHOCARDIOGRAM;  Surgeon: Grace Isaac, MD;  Location: [facility];  Service: Open Heart Surgery;  Laterality: N/A;  . LEFT HEART CATHETERIZATION WITH CORONARY ANGIOGRAM N/A 12/15/2011   Procedure: LEFT HEART CATHETERIZATION WITH CORONARY ANGIOGRAM;  Surgeon: Burnell Blanks, MD;  Location: [facility] CATH LAB;  Service: Cardiovascular;  Laterality: N/A;  . LEFT HEART CATHETERIZATION WITH CORONARY ANGIOGRAM N/A 01/10/2012   Procedure: LEFT HEART CATHETERIZATION WITH CORONARY ANGIOGRAM;  Surgeon: Peter M Martinique, MD;  Location: [facility] CATH LAB;  Service: Cardiovascular;  Laterality: N/A;  . LEFT HEART CATHETERIZATION WITH CORONARY ANGIOGRAM N/A 06/08/2012   Procedure: LEFT HEART  CATHETERIZATION WITH CORONARY ANGIOGRAM;  Surgeon: Burnell Blanks, MD;  Location: [facility] CATH LAB;  Service: Cardiovascular;  Laterality: N/A;  . LEFT HEART CATHETERIZATION WITH CORONARY/GRAFT ANGIOGRAM N/A 12/10/2013   Procedure: LEFT HEART CATHETERIZATION WITH Beatrix Fetters;  Surgeon: [name], MD;  Location: [facility] CATH LAB;  Service: Cardiovascular;  Laterality: N/A;  . OVARY SURGERY     ovarian cancer  . SHUNTOGRAM N/A 10/15/2013   Procedure: Fistulogram;  Surgeon: Serafina Mitchell, MD;  Location: [facility] CATH LAB;  Service: Cardiovascular;  Laterality: N/A;  . THROMBECTOMY / ARTERIOVENOUS GRAFT REVISION  2011   left upper arm  . TUBAL LIGATION  1980's    OB History    Gravida Para Term Preterm AB Living   2 2   2   2    SAB TAB Ectopic Multiple Live Births                   Home Medications    Prior to Admission medications   Medication Sig Start Date End Date Taking? Authorizing Provider  ALPRAZolam Duanne Moron) 0.25 MG tablet Take 1-2 tablets by mouth at bedtime as needed for  sleep.  11/19/15  Yes Historical Provider, MD  amLODipine (NORVASC) 2.5 MG tablet Take 2.5 mg by mouth daily.   Yes Historical Provider, MD  aspirin EC 81 MG tablet Take 81 mg by mouth daily.    Yes Historical Provider, MD  cloNIDine (CATAPRES) 0.2 MG tablet Take 1 tablet (0.2 mg total) by mouth 3 (three) times daily. 07/10/16  Yes Thurnell Lose, MD  epoetin alfa (EPOGEN,PROCRIT) 57017 UNIT/ML injection Inject 1 mL (10,000 Units total) into the vein every Monday, Wednesday, and Friday with hemodialysis. 04/11/16  Yes Orvan Falconer, MD  fluticasone (FLONASE) 50 MCG/ACT nasal spray Place 2 sprays into the nose daily as needed for allergies.    Yes Historical Provider, MD  hydrALAZINE (APRESOLINE) 50 MG tablet Take 1 tablet (50 mg total) by mouth every 8 (eight) hours. 07/10/16  Yes Thurnell Lose, MD  isosorbide mononitrate (IMDUR) 60 MG 24 hr tablet Take 1 tablet (60 mg total) by mouth daily. 07/10/16   Yes Thurnell Lose, MD  lanthanum (FOSRENOL) 1000 MG chewable tablet Chew 1 tablet (1,000 mg total) by mouth 3 (three) times daily with meals. 04/08/16  Yes Orvan Falconer, MD  multivitamin (RENA-VIT) TABS tablet Take 1 tablet by mouth daily.   Yes Historical Provider, MD  nitroGLYCERIN (NITROSTAT) 0.4 MG SL tablet Place 1 tablet (0.4 mg total) under the tongue every 5 (five) minutes as needed for chest pain. 04/13/16 07/16/16 Yes Herminio Commons, MD  omeprazole (PRILOSEC) 20 MG capsule TAKE ONE CAPSULE BY MOUTH ONCE DAILY 04/15/16  Yes Butch Penny, NP  PROAIR HFA 108 (90 BASE) MCG/ACT inhaler Inhale 1 puff into the lungs every 6 (six) hours as needed for wheezing or shortness of breath.  01/09/15  Yes Historical Provider, MD  SENSIPAR 30 MG tablet Take 30 mg by mouth daily with supper.  12/21/12  Yes Historical Provider, MD  simvastatin (ZOCOR) 20 MG tablet Take 1 tablet (20 mg total) by mouth at bedtime. 04/04/16  Yes Lendon Colonel, NP  amiodarone (PACERONE) 200 MG tablet Take 1 tablet (200 mg total) by mouth daily. Patient not taking: Reported on 07/16/2016 09/23/15   Herminio Commons, MD    Family History Family History  Problem Relation Age of Onset  . Heart disease Mother     Heart Disease before age 67  . Hyperlipidemia Mother   . Hypertension Mother   . Diabetes Mother   . Heart attack Mother   . Heart disease Father     Heart Disease before age 28  . Hyperlipidemia Father   . Hypertension Father   . Diabetes Father   . Diabetes Sister   . Hypertension Sister   . Diabetes Brother   . Hyperlipidemia Brother   . Heart attack Brother   . Hypertension Sister   . Heart attack Brother   . Other      noncontributory for early CAD  . Colon cancer Neg Hx   . Esophageal cancer Neg Hx   . Liver disease Neg Hx   . Kidney disease Neg Hx   . Colon polyps Neg Hx     Social History Social History  Substance Use Topics  . Smoking status: Never Smoker  . Smokeless tobacco: Never  Used  . Alcohol use No     Allergies   Aspirin; Contrast media [iodinated diagnostic agents]; Iron; Penicillins; Plavix [clopidogrel bisulfate]; Amlodipine; Bactrim [sulfamethoxazole-trimethoprim]; Dexilant [dexlansoprazole]; Morphine and related; Nitrofurantoin; Prilosec [omeprazole]; Venofer [ferric oxide]; Tylenol [acetaminophen]; Levaquin [  levofloxacin in d5w]; and Protonix [pantoprazole sodium]   Review of Systems Review of Systems  Constitutional: Negative for chills, fatigue and fever.  Respiratory: Positive for cough. Negative for shortness of breath.   Cardiovascular: Positive for chest pain. Negative for leg swelling.  Gastrointestinal: Negative for abdominal pain, constipation, diarrhea, nausea and vomiting.  Musculoskeletal: Positive for back pain and myalgias. Negative for neck pain and neck stiffness.  Skin: Negative for rash and wound.  Neurological: Negative for dizziness, weakness, light-headedness, numbness and headaches.  All other systems reviewed and are negative.    Physical Exam Updated Vital Signs BP 167/67   Pulse 79   Temp 98.9 F (37.2 C) (Temporal)   Resp 17   Ht 5\' 2"  (1.575 m)   Wt 125 lb (56.7 kg)   SpO2 100%   BMI 22.86 kg/m   Physical Exam  Constitutional: She is oriented to person, place, and time. She appears well-developed and well-nourished. No distress.  HENT:  Head: Normocephalic and atraumatic.  Mouth/Throat: Oropharynx is clear and moist. No oropharyngeal exudate.  Eyes: EOM are normal. Pupils are equal, round, and reactive to light.  Neck: Normal range of motion. Neck supple.  Cardiovascular: Normal rate and regular rhythm.   Pulmonary/Chest: Effort normal and breath sounds normal. No respiratory distress. She has no wheezes. She has no rales. She exhibits tenderness (tender to palpation over the right sternal border. No crepitance or deformity.).  Abdominal: Soft. Bowel sounds are normal. There is no tenderness. There is no  rebound and no guarding.  Musculoskeletal: Normal range of motion. She exhibits no edema or tenderness.  Left upper extremity with AV fistula. Palpable thrill. No lower extremity swelling or asymmetry.  Neurological: She is alert and oriented to person, place, and time.  Moves all extremities without deficit. Sensation intact.  Skin: Skin is warm and dry. Capillary refill takes less than 2 seconds. No rash noted. No erythema.  Psychiatric: She has a normal mood and affect. Her behavior is normal.  Nursing note and vitals reviewed.    ED Treatments / Results  Labs (all labs ordered are listed, but only abnormal results are displayed) Labs Reviewed  CBC WITH DIFFERENTIAL/PLATELET - Abnormal; Notable for the following:       Result Value   RBC 3.60 (*)    Hemoglobin 11.5 (*)    MCV 103.1 (*)    RDW 17.2 (*)    Eosinophils Absolute 0.9 (*)    All other components within normal limits  COMPREHENSIVE METABOLIC PANEL - Abnormal; Notable for the following:    Chloride 98 (*)    BUN 44 (*)    Creatinine, Ser 7.82 (*)    ALT 11 (*)    GFR calc non Af Amer 4 (*)    GFR calc Af Amer 5 (*)    Anion gap 16 (*)    All other components within normal limits  BRAIN NATRIURETIC PEPTIDE - Abnormal; Notable for the following:    B Natriuretic Peptide 372.0 (*)    All other components within normal limits  TROPONIN I - Abnormal; Notable for the following:    Troponin I 0.04 (*)    All other components within normal limits  TROPONIN I - Abnormal; Notable for the following:    Troponin I 0.04 (*)    All other components within normal limits    EKG  EKG Interpretation None       Radiology Dg Chest 2 View  Result Date: 07/16/2016 CLINICAL DATA:  Productive cough starting last night EXAM: CHEST  2 VIEW COMPARISON:  07/10/2016 FINDINGS: Mild cardiomegaly. Status post CABG with multiple coronary stents. Mild scarring at the right base. Aeration of the bases has improved since prior. There is  no edema, consolidation, effusion, or pneumothorax. IMPRESSION: No evidence of acute disease. Electronically Signed   By: Monte Fantasia M.D.   On: 07/16/2016 14:15    Procedures Procedures (including critical care time)  Medications Ordered in ED Medications  cloNIDine (CATAPRES) tablet 0.2 mg (0.2 mg Oral Given 07/16/16 1546)  hydrALAZINE (APRESOLINE) tablet 50 mg (50 mg Oral Given 07/16/16 1703)     Initial Impression / Assessment and Plan / ED Course  I have reviewed the triage vital signs and the nursing notes.  Pertinent labs & imaging results that were available during my care of the patient were reviewed by me and considered in my medical decision making (see chart for details).  Clinical Course     Patient states she has not taken her afternoon blood pressure medications. Was given hydralazine and clonidine in the emergency department. EKG with ST segment elevation in her anterior leads. Comparison with multiple previous EKGs with similar morphology. Will continue to monitor closely. Patient denies any symptoms currently. Blood pressures improved. Troponin 2 is normal. The patient can safely be discharged home to follow-up with her primary physician. Final Clinical Impressions(s) / ED Diagnoses   Final diagnoses:  Upper respiratory tract infection, unspecified type  Atypical chest pain  Hypertension, unspecified type    New Prescriptions Discharge Medication List as of 07/16/2016  8:24 PM       Julianne Rice, MD 07/17/16 2019

## 2016-07-16 NOTE — ED Triage Notes (Signed)
Pt reports productive cough that started last night. Denies SOB, fever. Pt also c/o back pain and chest pain with coughing.

## 2016-07-16 NOTE — ED Notes (Signed)
CRITICAL VALUE ALERT  Critical value received:  Troponin 0.04  Date of notification:  07/16/2016  Time of notification:  1700  Critical value read back:Yes.    Nurse who received alert:  Charmayne Sheer, RN  MD notified (1st page):  Lita Mains

## 2016-07-18 DIAGNOSIS — N186 End stage renal disease: Secondary | ICD-10-CM | POA: Diagnosis not present

## 2016-07-18 DIAGNOSIS — Z992 Dependence on renal dialysis: Secondary | ICD-10-CM | POA: Diagnosis not present

## 2016-07-19 DIAGNOSIS — I501 Left ventricular failure: Secondary | ICD-10-CM | POA: Diagnosis not present

## 2016-07-19 DIAGNOSIS — I10 Essential (primary) hypertension: Secondary | ICD-10-CM | POA: Diagnosis not present

## 2016-07-19 DIAGNOSIS — D631 Anemia in chronic kidney disease: Secondary | ICD-10-CM | POA: Diagnosis not present

## 2016-07-19 DIAGNOSIS — I2510 Atherosclerotic heart disease of native coronary artery without angina pectoris: Secondary | ICD-10-CM | POA: Diagnosis not present

## 2016-07-20 DIAGNOSIS — N186 End stage renal disease: Secondary | ICD-10-CM | POA: Diagnosis not present

## 2016-07-20 DIAGNOSIS — Z992 Dependence on renal dialysis: Secondary | ICD-10-CM | POA: Diagnosis not present

## 2016-07-21 DIAGNOSIS — I501 Left ventricular failure: Secondary | ICD-10-CM | POA: Diagnosis not present

## 2016-07-21 DIAGNOSIS — D631 Anemia in chronic kidney disease: Secondary | ICD-10-CM | POA: Diagnosis not present

## 2016-07-21 DIAGNOSIS — I10 Essential (primary) hypertension: Secondary | ICD-10-CM | POA: Diagnosis not present

## 2016-07-21 DIAGNOSIS — I2510 Atherosclerotic heart disease of native coronary artery without angina pectoris: Secondary | ICD-10-CM | POA: Diagnosis not present

## 2016-07-22 DIAGNOSIS — N186 End stage renal disease: Secondary | ICD-10-CM | POA: Diagnosis not present

## 2016-07-22 DIAGNOSIS — Z992 Dependence on renal dialysis: Secondary | ICD-10-CM | POA: Diagnosis not present

## 2016-07-25 ENCOUNTER — Other Ambulatory Visit (HOSPITAL_COMMUNITY): Payer: Self-pay | Admitting: Nephrology

## 2016-07-25 ENCOUNTER — Other Ambulatory Visit: Payer: Self-pay | Admitting: Radiology

## 2016-07-25 DIAGNOSIS — J019 Acute sinusitis, unspecified: Secondary | ICD-10-CM | POA: Diagnosis not present

## 2016-07-25 DIAGNOSIS — N186 End stage renal disease: Secondary | ICD-10-CM

## 2016-07-25 DIAGNOSIS — Z6825 Body mass index (BMI) 25.0-25.9, adult: Secondary | ICD-10-CM | POA: Diagnosis not present

## 2016-07-26 ENCOUNTER — Other Ambulatory Visit (HOSPITAL_COMMUNITY): Payer: Self-pay | Admitting: Nephrology

## 2016-07-26 ENCOUNTER — Ambulatory Visit (HOSPITAL_COMMUNITY)
Admission: RE | Admit: 2016-07-26 | Discharge: 2016-07-26 | Disposition: A | Discharge status: Home / Self Care | Payer: Medicare HMO | Source: Ambulatory Visit | Attending: Nephrology | Admitting: Nephrology

## 2016-07-26 ENCOUNTER — Encounter (HOSPITAL_COMMUNITY): Payer: Self-pay

## 2016-07-26 DIAGNOSIS — Z85038 Personal history of other malignant neoplasm of large intestine: Secondary | ICD-10-CM | POA: Insufficient documentation

## 2016-07-26 DIAGNOSIS — Z8543 Personal history of malignant neoplasm of ovary: Secondary | ICD-10-CM

## 2016-07-26 DIAGNOSIS — N186 End stage renal disease: Secondary | ICD-10-CM

## 2016-07-26 DIAGNOSIS — Z992 Dependence on renal dialysis: Secondary | ICD-10-CM

## 2016-07-26 DIAGNOSIS — K219 Gastro-esophageal reflux disease without esophagitis: Secondary | ICD-10-CM | POA: Insufficient documentation

## 2016-07-26 DIAGNOSIS — E7800 Pure hypercholesterolemia, unspecified: Secondary | ICD-10-CM | POA: Insufficient documentation

## 2016-07-26 DIAGNOSIS — I2510 Atherosclerotic heart disease of native coronary artery without angina pectoris: Secondary | ICD-10-CM

## 2016-07-26 DIAGNOSIS — I5032 Chronic diastolic (congestive) heart failure: Secondary | ICD-10-CM | POA: Insufficient documentation

## 2016-07-26 DIAGNOSIS — F419 Anxiety disorder, unspecified: Secondary | ICD-10-CM

## 2016-07-26 DIAGNOSIS — Z7982 Long term (current) use of aspirin: Secondary | ICD-10-CM | POA: Insufficient documentation

## 2016-07-26 DIAGNOSIS — I132 Hypertensive heart and chronic kidney disease with heart failure and with stage 5 chronic kidney disease, or end stage renal disease: Secondary | ICD-10-CM | POA: Insufficient documentation

## 2016-07-26 DIAGNOSIS — Z4901 Encounter for fitting and adjustment of extracorporeal dialysis catheter: Secondary | ICD-10-CM | POA: Diagnosis not present

## 2016-07-26 DIAGNOSIS — Z8673 Personal history of transient ischemic attack (TIA), and cerebral infarction without residual deficits: Secondary | ICD-10-CM | POA: Insufficient documentation

## 2016-07-26 DIAGNOSIS — Z79899 Other long term (current) drug therapy: Secondary | ICD-10-CM | POA: Insufficient documentation

## 2016-07-26 DIAGNOSIS — Z955 Presence of coronary angioplasty implant and graft: Secondary | ICD-10-CM

## 2016-07-26 DIAGNOSIS — I252 Old myocardial infarction: Secondary | ICD-10-CM | POA: Insufficient documentation

## 2016-07-26 DIAGNOSIS — Y832 Surgical operation with anastomosis, bypass or graft as the cause of abnormal reaction of the patient, or of later complication, without mention of misadventure at the time of the procedure: Secondary | ICD-10-CM | POA: Insufficient documentation

## 2016-07-26 DIAGNOSIS — T82818A Embolism of vascular prosthetic devices, implants and grafts, initial encounter: Secondary | ICD-10-CM | POA: Insufficient documentation

## 2016-07-26 HISTORY — PX: IR GENERIC HISTORICAL: IMG1180011

## 2016-07-26 LAB — CBC WITH DIFFERENTIAL/PLATELET
BASOS PCT: 0 %
Basophils Absolute: 0 10*3/uL (ref 0.0–0.1)
EOS PCT: 0 %
Eosinophils Absolute: 0 10*3/uL (ref 0.0–0.7)
HEMATOCRIT: 33 % — AB (ref 36.0–46.0)
Hemoglobin: 10.4 g/dL — ABNORMAL LOW (ref 12.0–15.0)
Lymphocytes Relative: 4 %
Lymphs Abs: 0.4 10*3/uL — ABNORMAL LOW (ref 0.7–4.0)
MCH: 31.6 pg (ref 26.0–34.0)
MCHC: 31.5 g/dL (ref 30.0–36.0)
MCV: 100.3 fL — AB (ref 78.0–100.0)
MONO ABS: 0.1 10*3/uL (ref 0.1–1.0)
MONOS PCT: 1 %
Neutro Abs: 10.4 10*3/uL — ABNORMAL HIGH (ref 1.7–7.7)
Neutrophils Relative %: 95 %
Platelets: 366 10*3/uL (ref 150–400)
RBC: 3.29 MIL/uL — ABNORMAL LOW (ref 3.87–5.11)
RDW: 18.6 % — AB (ref 11.5–15.5)
WBC: 10.9 10*3/uL — ABNORMAL HIGH (ref 4.0–10.5)

## 2016-07-26 LAB — POCT I-STAT, CHEM 8
BUN: 23 mg/dL — ABNORMAL HIGH (ref 6–20)
CHLORIDE: 95 mmol/L — AB (ref 101–111)
Calcium, Ion: 1.06 mmol/L — ABNORMAL LOW (ref 1.15–1.40)
Creatinine, Ser: 5 mg/dL — ABNORMAL HIGH (ref 0.44–1.00)
Glucose, Bld: 99 mg/dL (ref 65–99)
HEMATOCRIT: 33 % — AB (ref 36.0–46.0)
Hemoglobin: 11.2 g/dL — ABNORMAL LOW (ref 12.0–15.0)
POTASSIUM: 2.4 mmol/L — AB (ref 3.5–5.1)
SODIUM: 135 mmol/L (ref 135–145)
TCO2: 28 mmol/L (ref 0–100)

## 2016-07-26 LAB — PROTIME-INR
INR: 1.02
Prothrombin Time: 13.4 seconds (ref 11.4–15.2)

## 2016-07-26 LAB — POCT I-STAT 4, (NA,K, GLUC, HGB,HCT)
GLUCOSE: 137 mg/dL — AB (ref 65–99)
Glucose, Bld: 137 mg/dL — ABNORMAL HIGH (ref 65–99)
HCT: 34 % — ABNORMAL LOW (ref 36.0–46.0)
HCT: 34 % — ABNORMAL LOW (ref 36.0–46.0)
HEMOGLOBIN: 11.6 g/dL — AB (ref 12.0–15.0)
Hemoglobin: 11.6 g/dL — ABNORMAL LOW (ref 12.0–15.0)
POTASSIUM: 7.5 mmol/L — AB (ref 3.5–5.1)
POTASSIUM: 7.5 mmol/L — AB (ref 3.5–5.1)
Sodium: 133 mmol/L — ABNORMAL LOW (ref 135–145)
Sodium: 133 mmol/L — ABNORMAL LOW (ref 135–145)

## 2016-07-26 LAB — POTASSIUM
Potassium: >7.5 mmol/L (ref 3.5–5.1)
Potassium: 6.4 mmol/L (ref 3.5–5.1)

## 2016-07-26 IMAGING — US IR FLUORO GUIDE CV LINE*R*
2 series · 3 of 3 positions shown · non-contrast
Comparison: none

INDICATION: End-stage renal disease, clotted AV fistula, hyperkalemia

[Series 1: ir (id) (id)/(id)/(id) ir · 1 of 1 slices shown]
[im 1/1]
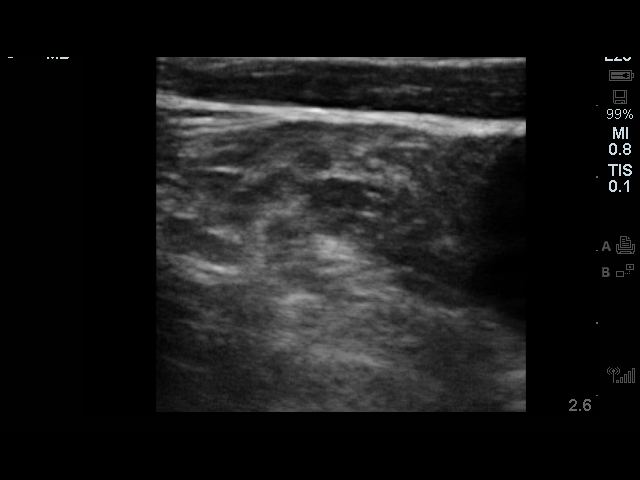

[Series 300: ir declot thromb agent implant vasc devi · 2 of 2 slices shown]
[im 1/2]
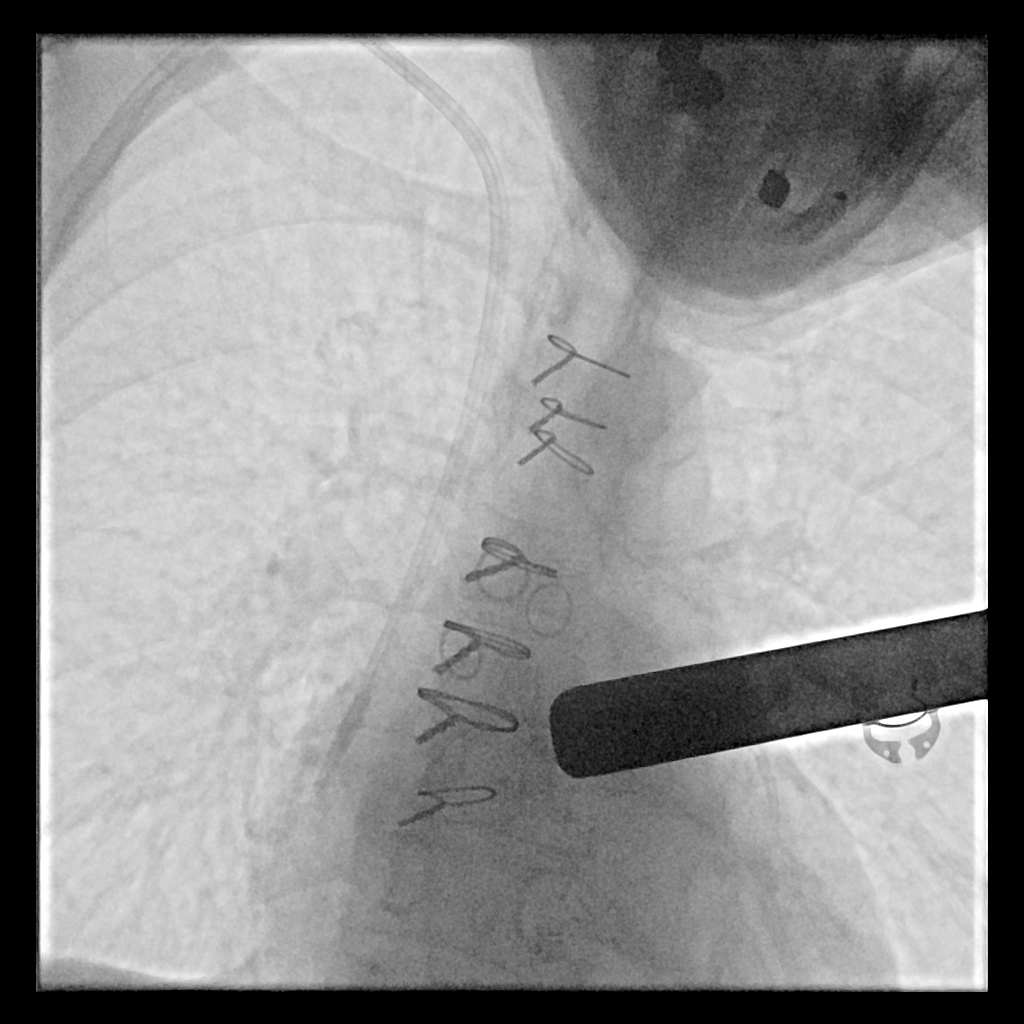
[im 2/2]
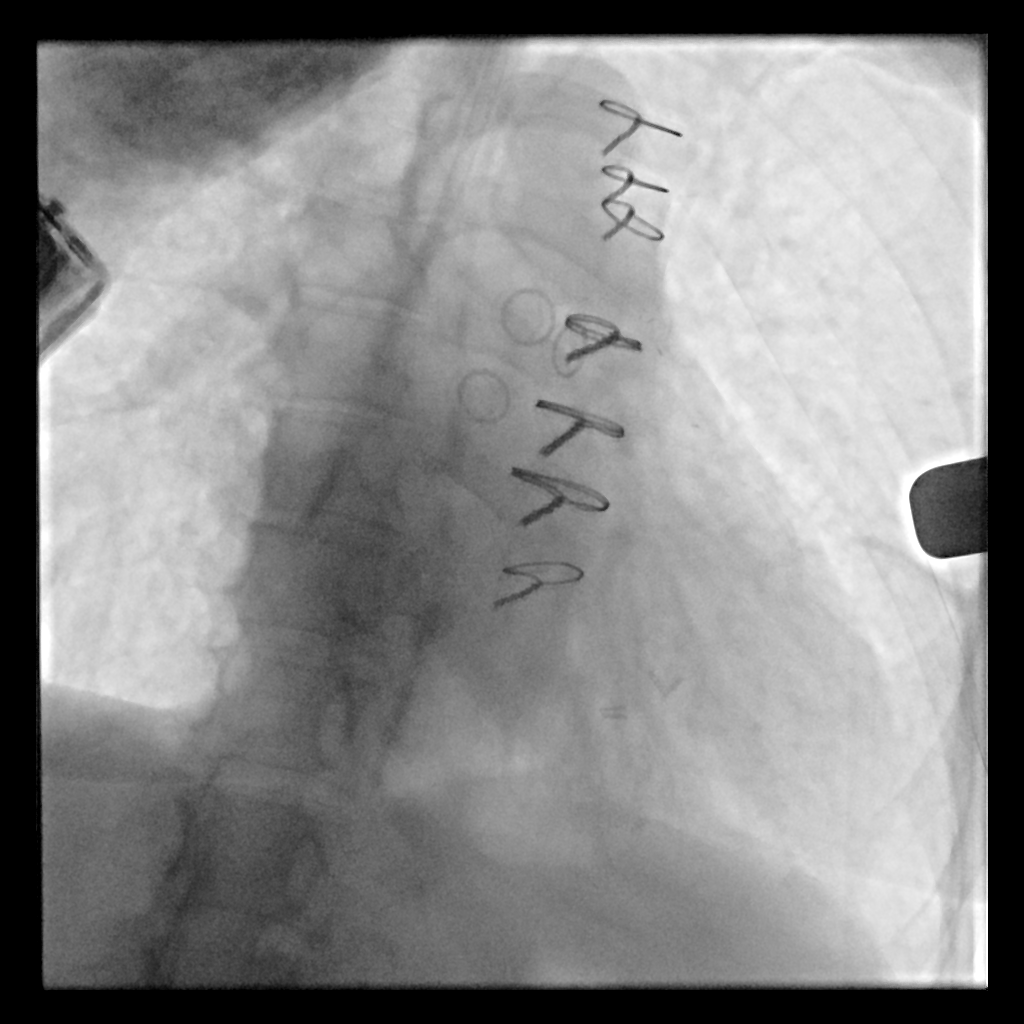

[3 of 3 positions shown; findings below may reference images not displayed]

EXAM:
NON TUNNELED CENTRAL VENOUS HEMODIALYSIS CATHETER WITH ULTRASOUND
FLUOROSCOPIC GUIDANCE

MEDICATIONS:
1% lidocaine locally

ANESTHESIA/SEDATION:
Moderate (conscious) sedation was employed during this procedure. A
total of Versed 0.5 mg was administered intravenously.

Moderate Sedation Time: 15 minutes. The patient's level of
consciousness and vital signs were monitored continuously by
radiology nursing throughout the procedure under my direct
supervision.

FLUOROSCOPY TIME:  Fluoroscopy Time: 0 minutes 24 seconds (1 mGy).

COMPLICATIONS:
None immediate.

PROCEDURE:
Informed written consent was obtained from the patient after a
discussion of the risks, benefits, and alternatives to treatment.
Questions regarding the procedure were encouraged and answered. The
right neck and chest were prepped with chlorhexidine in a sterile
fashion, and a sterile drape was applied covering the operative
field. Maximum barrier sterile technique with sterile gowns and
gloves were used for the procedure. A timeout was performed prior to
the initiation of the procedure.

After creating a small venotomy incision, a micropuncture kit was
utilized to access the right internal jugular vein under direct,
real-time ultrasound guidance after the overlying soft tissues were
anesthetized with 1% lidocaine with epinephrine. Ultrasound image
documentation was performed. Micro guidewire advanced. Four French
dilator inserted. Amplatz guidewire advanced into the IVC. Tract
dilatation performed to insert a 20 cm temporary dialysis catheter.
Tip position at the SVC RA junction. Catheter secured with a Prolene
suture and a sterile dressing. Blood aspirated easily followed by
saline and heparin flushes. External caps applied. Patient tolerated
the procedure well. No immediate complication.
IMPRESSION: Successful ultrasound and fluoroscopic insertion of a 20 cm
temporary right IJ dialysis catheter. Tip SVC RA junction. Ready for
use.

## 2016-07-26 MED ORDER — LIDOCAINE HCL 1 % IJ SOLN
INTRAMUSCULAR | Status: DC | PRN
  Administered 2016-07-26: 10 mL

## 2016-07-26 MED ORDER — MIDAZOLAM HCL 2 MG/2ML IJ SOLN
INTRAMUSCULAR | Status: AC
  Administered 2016-07-26: 0.5 mg
  Filled 2016-07-26: qty 2

## 2016-07-26 MED ORDER — DIPHENHYDRAMINE HCL 50 MG PO CAPS
50.0000 mg | ORAL_CAPSULE | ORAL | Status: AC
  Administered 2016-07-26: 50 mg via ORAL
  Filled 2016-07-26: qty 1

## 2016-07-26 MED ORDER — DIPHENHYDRAMINE HCL 25 MG PO CAPS
ORAL_CAPSULE | ORAL | Status: AC
  Administered 2016-07-26: 50 mg via ORAL
  Filled 2016-07-26: qty 2

## 2016-07-26 MED ORDER — LIDOCAINE HCL (PF) 1 % IJ SOLN
INTRAMUSCULAR | Status: AC
  Filled 2016-07-26: qty 30

## 2016-07-26 MED ORDER — HEPARIN SODIUM (PORCINE) 1000 UNIT/ML IJ SOLN
INTRAMUSCULAR | Status: AC
  Filled 2016-07-26: qty 1

## 2016-07-26 NOTE — H&P (Signed)
Chief Complaint: clotted AVF of LUE  Referring Physician:Dr. Fran Lowes  Supervising Physician: Daryll Brod  Patient Status: [facility] - Out-pt  HPI: Shawna [name] is an 76 y.o. female with a complex PMH who has a LUE AVF that clotted off in July of 2016.  She underwent a declot by Dr. Earleen Newport.  This was difficult, but he was successful.  Apparently, during the case, she developed a hypertensive emergency and unresponsiveness.  She required intubation and admission to the hospital.  Her fistula has done well since that time.  She last had HD on Friday.  When she went yesterday for HD, she was found to have no thrill and we have been asked to evaluate the patient for further treatment.  She has no immediate other complaints.  Past Medical History:  Past Medical History:  Diagnosis Date  . Anxiety   . Arthritis   . AVM (arteriovenous malformation) of colon   . CAD (coronary artery disease)    a. 12/2011 NSTEMI/Cath/PCI LCX (2.25x14 Resolute DES) & D1 (2.25x22 Resolute DES);  b. 01/2012 Cath/PCI: LM 30, LAD 30p, 40-75m, D1 stent ok, 99 in sm branch of diag, LCX patent stent, OM1 20, RCA 95 ost (4.0x12 Promus DES), EF 55%;  c. 04/2012 Lexi Cardiolite  EF 48%, small area of scar @ base/mid inflat wall with mild peri-infarct ischemia.; CABG 12/4  . Carotid artery disease ([place])    a. 32-35% LICA, 11/7320   . Chronic bronchitis ([place])   . Chronic diastolic CHF (congestive heart failure) ([place])    a. 02/2012 Echo EF 60-65%, nl wall motion, Gr 1 DD, mod MR  . Colon cancer ([place]) 1992  . Esophageal stricture   . ESRD on hemodialysis ([place])    ESRD due to HTN, started dialysis 2011 and gets HD at [facility] with Dr Hinda Lenis on MWF schedule.  Access is LUA AVF as of Sept 2014.   Marland Kitchen GERD (gastroesophageal reflux disease)   . High cholesterol 12/2011  . History of blood transfusion 07/2011; 12/2011; 01/2012 X 2; 04/2012  . History of gout   . History of lower GI bleeding   . Hypertension   . Iron  deficiency anemia   . Mitral regurgitation    a. Moderate by echo, 02/2012  . Myocardial infarction   . Ovarian cancer ([place]) 1992  . Pneumonia ~ 2009  . PUD (peptic ulcer disease)   . TIA (transient ischemic attack)     Past Surgical History:  Past Surgical History:  Procedure Laterality Date  . ABDOMINAL HYSTERECTOMY  1992  . APPENDECTOMY  06/1990  . AV FISTULA PLACEMENT  07/2009   left upper arm  . COLON RESECTION  1992  . CORONARY ANGIOPLASTY WITH STENT PLACEMENT  12/15/11   "2"  . CORONARY ANGIOPLASTY WITH STENT PLACEMENT  y/2013   "1; makes total of 3" (05/02/2012)  . CORONARY ARTERY BYPASS GRAFT  06/13/2012   Procedure: CORONARY ARTERY BYPASS GRAFTING (CABG);  Surgeon: Grace Isaac, MD;  Location: [facility];  Service: Open Heart Surgery;  Laterality: N/A;  cabg x four;  using left internal mammary artery, and left leg greater saphenous vein harvested endoscopically  . DILATION AND CURETTAGE OF UTERUS    . ESOPHAGOGASTRODUODENOSCOPY  01/20/2012   Procedure: ESOPHAGOGASTRODUODENOSCOPY (EGD);  Surgeon: Ladene Artist, MD,FACG;  Location[facility] ENDOSCOPY;  Service: Endoscopy;  Laterality: N/A;  . ESOPHAGOGASTRODUODENOSCOPY N/A 03/26/2013   Procedure: ESOPHAGOGASTRODUODENOSCOPY (EGD);  Surgeon: Irene Shipper, MD;  Location: [facility]  ENDOSCOPY;  Service: Endoscopy;  Laterality: N/A;  . ESOPHAGOGASTRODUODENOSCOPY N/A 04/30/2015   Procedure: ESOPHAGOGASTRODUODENOSCOPY (EGD);  Surgeon: Rogene Houston, MD;  [facility]O [facility];  Service: Endoscopy;  Laterality: N/A;  1pm - moved to 10/20 @ 1:10  . INTRAOPERATIVE TRANSESOPHAGEAL ECHOCARDIOGRAM  06/13/2012   Procedure: INTRAOPERATIVE TRANSESOPHAGEAL ECHOCARDIOGRAM;  Surgeon: Grace Isaac, MD;  Location: [facility];  Service: Open Heart Surgery;  Laterality: N/A;  . LEFT HEART CATHETERIZATION WITH CORONARY ANGIOGRAM N/A 12/15/2011   Procedure: LEFT HEART CATHETERIZATION WITH CORONARY ANGIOGRAM;  Surgeon: Burnell Blanks, MD;  Location: [facility] CATH  LAB;  Service: Cardiovascular;  Laterality: N/A;  . LEFT HEART CATHETERIZATION WITH CORONARY ANGIOGRAM N/A 01/10/2012   Procedure: LEFT HEART CATHETERIZATION WITH CORONARY ANGIOGRAM;  Surgeon: Peter M Martinique, MD;  Location: [facility] CATH LAB;  Service: Cardiovascular;  Laterality: N/A;  . LEFT HEART CATHETERIZATION WITH CORONARY ANGIOGRAM N/A 06/08/2012   Procedure: LEFT HEART CATHETERIZATION WITH CORONARY ANGIOGRAM;  Surgeon: Burnell Blanks, MD;  Location: [facility] CATH LAB;  Service: Cardiovascular;  Laterality: N/A;  . LEFT HEART CATHETERIZATION WITH CORONARY/GRAFT ANGIOGRAM N/A 12/10/2013   Procedure: LEFT HEART CATHETERIZATION WITH Beatrix Fetters;  Surgeon: [name], MD;  Location: [facility] CATH LAB;  Service: Cardiovascular;  Laterality: N/A;  . OVARY SURGERY     ovarian cancer  . SHUNTOGRAM N/A 10/15/2013   Procedure: Fistulogram;  Surgeon: Serafina Mitchell, MD;  Location: [facility] CATH LAB;  Service: Cardiovascular;  Laterality: N/A;  . THROMBECTOMY / ARTERIOVENOUS GRAFT REVISION  2011   left upper arm  . TUBAL LIGATION  1980's    Family History:  Family History  Problem Relation Age of Onset  . Heart disease Mother     Heart Disease before age 20  . Hyperlipidemia Mother   . Hypertension Mother   . Diabetes Mother   . Heart attack Mother   . Heart disease Father     Heart Disease before age 63  . Hyperlipidemia Father   . Hypertension Father   . Diabetes Father   . Diabetes Sister   . Hypertension Sister   . Diabetes Brother   . Hyperlipidemia Brother   . Heart attack Brother   . Hypertension Sister   . Heart attack Brother   . Other      noncontributory for early CAD  . Colon cancer Neg Hx   . Esophageal cancer Neg Hx   . Liver disease Neg Hx   . Kidney disease Neg Hx   . Colon polyps Neg Hx     Social History:  reports that she has never smoked. She has never used smokeless tobacco. She reports that she does not drink alcohol or use drugs.  Allergies:    Multiple allergies, all reviewed.   Medications: Medications reviewed in epic  Please HPI for pertinent positives, otherwise complete 10 system ROS negative.  Mallampati Score: MD Evaluation Airway: WNL Heart: WNL Abdomen: WNL Chest/ Lungs: WNL ASA  Classification: 3 Mallampati/Airway Score: Two  Physical Exam: BP (!) 193/113   Pulse 78   Temp 98.4 F (36.9 C)   Resp 18   SpO2 100%  There is no height or weight on file to calculate BMI. General: black female who is laying in bed in NAD HEENT: head is normocephalic, atraumatic.  Sclera are noninjected.  PERRL.  Ears and nose without any masses or lesions.  Mouth is pink and moist Heart: regular, rate, and rhythm.  Normal s1,s2. No obvious murmurs, gallops, or  rubs noted.  Palpable radial and pedal pulses bilaterally Lungs: CTAB, no wheezes, rhonchi, or rales noted.  Respiratory effort nonlabored Abd: soft, NT, ND, +BS, no masses, hernias, or organomegaly MS: all 4 extremities are symmetrical with no cyanosis, clubbing, or edema. Except her has an aneurysmal LUE AVF with no palpable thrill in place. Psych: A&Ox3 with an appropriate affect.   Labs: pending  Imaging: No results found.  Assessment/Plan 1. Clotted AVF -the patient presents for fistulogram and treatment of her clotted LUE AVF.   -her blood pressure is noted to be 193/113 right now.  Her last procedure she had a HTN emergency and became unresponsive and required intubation and admission.  She is convinced that this was secondary to oversedation; however, in reviewing the notes, despite this is her thoughts, it appears she arrested secondary to her HTN crisis and not from oversedation. - I have discussed this with Dr. Annamaria Boots and her currently blood pressure.  He will very likely choose to forego the fistulogram and proceed with a tunneled HD catheter placement. -vitals reviewed.  Her labs are currently pending as they are having a hard time accessing her  veins. -patient does have a IV contrast allergy.  She has taken her prednisone and 50mg  of benadryl has been given here. Risks and Benefits discussed with the patient including, but not limited to bleeding, infection, vascular injury, pulmonary embolism, need for tunneled HD catheter placement or even death. All of the patient's questions were answered, patient is agreeable to proceed. Consent signed and in chart.   Thank you for this interesting consult.  I greatly enjoyed meeting Shawna [name] and look forward to participating in their care.  A copy of this report was sent to the requesting provider on this date.  Electronically Signed: Henreitta Cea 07/26/2016, 10:50 AM   I spent a total of  30 Minutes   in face to face in clinical consultation, greater than 50% of which was counseling/coordinating care for clotted LUE AVF

## 2016-07-26 NOTE — Progress Notes (Signed)
Pt tolerated tx well; denies SOB; pain, dizziness or n/v. Weakness noted; pt assist x1; helped patient dress to go home; K checked too soon after tx; result not accurate; MD called and made aware of the low K and stated that I had checked K too soon after tx. Pt alert and oriented; able to give me her daughter's cell number after failing to reach her husband. Husband finally came to the unit and I escorted pt on a wheelchair to the ED together with her husband. They driven to their car by security. Advised pt and husband to go for  dialysis tx on 07/27/16 since she had missed two tx prior to today.

## 2016-07-26 NOTE — Progress Notes (Signed)
Pt transferred to hemodialysis for treatment. Will be discharged home after and return Thursday for HD placement in IR. MD aware of lab results. Family updated and at bedside

## 2016-07-26 NOTE — Procedures (Signed)
S/P RT IJ HD CATH  No comp Stable Tip svc/ra Ready for use Full report in PACS

## 2016-07-26 NOTE — Progress Notes (Signed)
Pt took home dose of Prednisone 50mg  PO 07/26/16 @ 1040.

## 2016-07-27 ENCOUNTER — Inpatient Hospital Stay (HOSPITAL_COMMUNITY): Payer: Medicare HMO

## 2016-07-27 ENCOUNTER — Inpatient Hospital Stay (HOSPITAL_COMMUNITY)
Admission: EM | Admit: 2016-07-27 | Discharge: 2016-08-02 | DRG: 987 | Disposition: A | Discharge status: Home Health | Payer: Medicare HMO | Attending: Internal Medicine | Admitting: Internal Medicine

## 2016-07-27 ENCOUNTER — Encounter (HOSPITAL_COMMUNITY): Payer: Self-pay | Admitting: *Deleted

## 2016-07-27 ENCOUNTER — Other Ambulatory Visit: Payer: Self-pay | Admitting: Radiology

## 2016-07-27 ENCOUNTER — Emergency Department (HOSPITAL_COMMUNITY): Payer: Medicare HMO

## 2016-07-27 DIAGNOSIS — D631 Anemia in chronic kidney disease: Secondary | ICD-10-CM | POA: Diagnosis present

## 2016-07-27 DIAGNOSIS — I248 Other forms of acute ischemic heart disease: Secondary | ICD-10-CM | POA: Diagnosis present

## 2016-07-27 DIAGNOSIS — E875 Hyperkalemia: Secondary | ICD-10-CM | POA: Diagnosis present

## 2016-07-27 DIAGNOSIS — I10 Essential (primary) hypertension: Secondary | ICD-10-CM | POA: Diagnosis not present

## 2016-07-27 DIAGNOSIS — I5042 Chronic combined systolic (congestive) and diastolic (congestive) heart failure: Secondary | ICD-10-CM | POA: Diagnosis present

## 2016-07-27 DIAGNOSIS — I252 Old myocardial infarction: Secondary | ICD-10-CM

## 2016-07-27 DIAGNOSIS — J90 Pleural effusion, not elsewhere classified: Secondary | ICD-10-CM | POA: Diagnosis not present

## 2016-07-27 DIAGNOSIS — F419 Anxiety disorder, unspecified: Secondary | ICD-10-CM | POA: Diagnosis present

## 2016-07-27 DIAGNOSIS — I5033 Acute on chronic diastolic (congestive) heart failure: Secondary | ICD-10-CM | POA: Diagnosis not present

## 2016-07-27 DIAGNOSIS — R519 Headache, unspecified: Secondary | ICD-10-CM

## 2016-07-27 DIAGNOSIS — K219 Gastro-esophageal reflux disease without esophagitis: Secondary | ICD-10-CM | POA: Diagnosis present

## 2016-07-27 DIAGNOSIS — E785 Hyperlipidemia, unspecified: Secondary | ICD-10-CM | POA: Diagnosis present

## 2016-07-27 DIAGNOSIS — D1330 Benign neoplasm of unspecified part of small intestine: Secondary | ICD-10-CM | POA: Diagnosis not present

## 2016-07-27 DIAGNOSIS — I5022 Chronic systolic (congestive) heart failure: Secondary | ICD-10-CM | POA: Diagnosis present

## 2016-07-27 DIAGNOSIS — N186 End stage renal disease: Secondary | ICD-10-CM | POA: Diagnosis present

## 2016-07-27 DIAGNOSIS — I5032 Chronic diastolic (congestive) heart failure: Secondary | ICD-10-CM | POA: Diagnosis present

## 2016-07-27 DIAGNOSIS — T82818A Embolism of vascular prosthetic devices, implants and grafts, initial encounter: Secondary | ICD-10-CM | POA: Diagnosis not present

## 2016-07-27 DIAGNOSIS — Z951 Presence of aortocoronary bypass graft: Secondary | ICD-10-CM | POA: Diagnosis not present

## 2016-07-27 DIAGNOSIS — K5520 Angiodysplasia of colon without hemorrhage: Secondary | ICD-10-CM | POA: Diagnosis not present

## 2016-07-27 DIAGNOSIS — D62 Acute posthemorrhagic anemia: Secondary | ICD-10-CM | POA: Diagnosis present

## 2016-07-27 DIAGNOSIS — E782 Mixed hyperlipidemia: Secondary | ICD-10-CM | POA: Diagnosis present

## 2016-07-27 DIAGNOSIS — Z452 Encounter for adjustment and management of vascular access device: Secondary | ICD-10-CM

## 2016-07-27 DIAGNOSIS — N185 Chronic kidney disease, stage 5: Secondary | ICD-10-CM | POA: Diagnosis not present

## 2016-07-27 DIAGNOSIS — I340 Nonrheumatic mitral (valve) insufficiency: Secondary | ICD-10-CM | POA: Diagnosis not present

## 2016-07-27 DIAGNOSIS — I161 Hypertensive emergency: Principal | ICD-10-CM | POA: Diagnosis present

## 2016-07-27 DIAGNOSIS — R51 Headache: Secondary | ICD-10-CM | POA: Diagnosis not present

## 2016-07-27 DIAGNOSIS — K635 Polyp of colon: Secondary | ICD-10-CM | POA: Diagnosis present

## 2016-07-27 DIAGNOSIS — I739 Peripheral vascular disease, unspecified: Secondary | ICD-10-CM | POA: Diagnosis present

## 2016-07-27 DIAGNOSIS — N189 Chronic kidney disease, unspecified: Secondary | ICD-10-CM

## 2016-07-27 DIAGNOSIS — I2510 Atherosclerotic heart disease of native coronary artery without angina pectoris: Secondary | ICD-10-CM | POA: Diagnosis present

## 2016-07-27 DIAGNOSIS — D649 Anemia, unspecified: Secondary | ICD-10-CM | POA: Diagnosis not present

## 2016-07-27 DIAGNOSIS — R079 Chest pain, unspecified: Secondary | ICD-10-CM | POA: Diagnosis not present

## 2016-07-27 DIAGNOSIS — I213 ST elevation (STEMI) myocardial infarction of unspecified site: Secondary | ICD-10-CM | POA: Diagnosis not present

## 2016-07-27 DIAGNOSIS — Z992 Dependence on renal dialysis: Secondary | ICD-10-CM

## 2016-07-27 DIAGNOSIS — I214 Non-ST elevation (NSTEMI) myocardial infarction: Secondary | ICD-10-CM | POA: Diagnosis not present

## 2016-07-27 DIAGNOSIS — M109 Gout, unspecified: Secondary | ICD-10-CM | POA: Diagnosis present

## 2016-07-27 DIAGNOSIS — I249 Acute ischemic heart disease, unspecified: Secondary | ICD-10-CM

## 2016-07-27 DIAGNOSIS — Z79899 Other long term (current) drug therapy: Secondary | ICD-10-CM

## 2016-07-27 DIAGNOSIS — K5521 Angiodysplasia of colon with hemorrhage: Secondary | ICD-10-CM | POA: Diagnosis present

## 2016-07-27 DIAGNOSIS — I132 Hypertensive heart and chronic kidney disease with heart failure and with stage 5 chronic kidney disease, or end stage renal disease: Secondary | ICD-10-CM | POA: Diagnosis present

## 2016-07-27 DIAGNOSIS — Z8711 Personal history of peptic ulcer disease: Secondary | ICD-10-CM

## 2016-07-27 DIAGNOSIS — R195 Other fecal abnormalities: Secondary | ICD-10-CM | POA: Diagnosis not present

## 2016-07-27 DIAGNOSIS — Z85038 Personal history of other malignant neoplasm of large intestine: Secondary | ICD-10-CM | POA: Diagnosis not present

## 2016-07-27 DIAGNOSIS — Z7982 Long term (current) use of aspirin: Secondary | ICD-10-CM

## 2016-07-27 DIAGNOSIS — Z955 Presence of coronary angioplasty implant and graft: Secondary | ICD-10-CM | POA: Diagnosis not present

## 2016-07-27 DIAGNOSIS — Z8673 Personal history of transient ischemic attack (TIA), and cerebral infarction without residual deficits: Secondary | ICD-10-CM

## 2016-07-27 DIAGNOSIS — K449 Diaphragmatic hernia without obstruction or gangrene: Secondary | ICD-10-CM | POA: Diagnosis present

## 2016-07-27 DIAGNOSIS — N2581 Secondary hyperparathyroidism of renal origin: Secondary | ICD-10-CM | POA: Diagnosis not present

## 2016-07-27 DIAGNOSIS — Y832 Surgical operation with anastomosis, bypass or graft as the cause of abnormal reaction of the patient, or of later complication, without mention of misadventure at the time of the procedure: Secondary | ICD-10-CM | POA: Diagnosis not present

## 2016-07-27 DIAGNOSIS — I169 Hypertensive crisis, unspecified: Secondary | ICD-10-CM | POA: Diagnosis not present

## 2016-07-27 DIAGNOSIS — M1990 Unspecified osteoarthritis, unspecified site: Secondary | ICD-10-CM | POA: Diagnosis not present

## 2016-07-27 DIAGNOSIS — I120 Hypertensive chronic kidney disease with stage 5 chronic kidney disease or end stage renal disease: Secondary | ICD-10-CM | POA: Diagnosis not present

## 2016-07-27 DIAGNOSIS — Z4901 Encounter for fitting and adjustment of extracorporeal dialysis catheter: Secondary | ICD-10-CM | POA: Diagnosis not present

## 2016-07-27 DIAGNOSIS — Z01810 Encounter for preprocedural cardiovascular examination: Secondary | ICD-10-CM | POA: Diagnosis not present

## 2016-07-27 DIAGNOSIS — Z8543 Personal history of malignant neoplasm of ovary: Secondary | ICD-10-CM

## 2016-07-27 DIAGNOSIS — E7800 Pure hypercholesterolemia, unspecified: Secondary | ICD-10-CM | POA: Diagnosis present

## 2016-07-27 DIAGNOSIS — I447 Left bundle-branch block, unspecified: Secondary | ICD-10-CM | POA: Diagnosis present

## 2016-07-27 DIAGNOSIS — Z91041 Radiographic dye allergy status: Secondary | ICD-10-CM

## 2016-07-27 DIAGNOSIS — K558 Other vascular disorders of intestine: Secondary | ICD-10-CM | POA: Diagnosis not present

## 2016-07-27 LAB — RENAL FUNCTION PANEL
Albumin: 2.8 g/dL — ABNORMAL LOW (ref 3.5–5.0)
Anion gap: 12 (ref 5–15)
BUN: 53 mg/dL — AB (ref 6–20)
CHLORIDE: 99 mmol/L — AB (ref 101–111)
CO2: 26 mmol/L (ref 22–32)
CREATININE: 9.11 mg/dL — AB (ref 0.44–1.00)
Calcium: 8.1 mg/dL — ABNORMAL LOW (ref 8.9–10.3)
GFR calc Af Amer: 4 mL/min — ABNORMAL LOW (ref >60)
GFR, EST NON AFRICAN AMERICAN: 4 mL/min — AB (ref >60)
Glucose, Bld: 97 mg/dL (ref 65–99)
Phosphorus: 6.7 mg/dL — ABNORMAL HIGH (ref 2.5–4.6)
Potassium: 4.6 mmol/L (ref 3.5–5.1)
Sodium: 137 mmol/L (ref 135–145)

## 2016-07-27 LAB — CBC WITH DIFFERENTIAL/PLATELET
Basophils Absolute: 0 10*3/uL (ref 0.0–0.1)
Basophils Relative: 0 %
EOS ABS: 0 10*3/uL (ref 0.0–0.7)
EOS PCT: 0 %
HCT: 27.2 % — ABNORMAL LOW (ref 36.0–46.0)
Hemoglobin: 8.6 g/dL — ABNORMAL LOW (ref 12.0–15.0)
LYMPHS ABS: 0.5 10*3/uL — AB (ref 0.7–4.0)
LYMPHS PCT: 4 %
MCH: 31.9 pg (ref 26.0–34.0)
MCHC: 31.6 g/dL (ref 30.0–36.0)
MCV: 100.7 fL — AB (ref 78.0–100.0)
MONOS PCT: 9 %
Monocytes Absolute: 1 10*3/uL (ref 0.1–1.0)
Neutro Abs: 10.2 10*3/uL — ABNORMAL HIGH (ref 1.7–7.7)
Neutrophils Relative %: 87 %
PLATELETS: 303 10*3/uL (ref 150–400)
RBC: 2.7 MIL/uL — ABNORMAL LOW (ref 3.87–5.11)
RDW: 18.2 % — ABNORMAL HIGH (ref 11.5–15.5)
WBC: 11.7 10*3/uL — ABNORMAL HIGH (ref 4.0–10.5)

## 2016-07-27 LAB — BASIC METABOLIC PANEL
Anion gap: 16 — ABNORMAL HIGH (ref 5–15)
BUN: 47 mg/dL — AB (ref 6–20)
CHLORIDE: 92 mmol/L — AB (ref 101–111)
CO2: 26 mmol/L (ref 22–32)
CREATININE: 7.99 mg/dL — AB (ref 0.44–1.00)
Calcium: 8.8 mg/dL — ABNORMAL LOW (ref 8.9–10.3)
GFR calc Af Amer: 5 mL/min — ABNORMAL LOW (ref >60)
GFR, EST NON AFRICAN AMERICAN: 4 mL/min — AB (ref >60)
GLUCOSE: 147 mg/dL — AB (ref 65–99)
POTASSIUM: 4.3 mmol/L (ref 3.5–5.1)
SODIUM: 134 mmol/L — AB (ref 135–145)

## 2016-07-27 LAB — HEMOGLOBIN AND HEMATOCRIT, BLOOD
HCT: 28.5 % — ABNORMAL LOW (ref 36.0–46.0)
Hemoglobin: 9.2 g/dL — ABNORMAL LOW (ref 12.0–15.0)

## 2016-07-27 LAB — CBC
HCT: 25.8 % — ABNORMAL LOW (ref 36.0–46.0)
Hemoglobin: 8.1 g/dL — ABNORMAL LOW (ref 12.0–15.0)
MCH: 31.4 pg (ref 26.0–34.0)
MCHC: 31.4 g/dL (ref 30.0–36.0)
MCV: 100 fL (ref 78.0–100.0)
PLATELETS: 258 10*3/uL (ref 150–400)
RBC: 2.58 MIL/uL — ABNORMAL LOW (ref 3.87–5.11)
RDW: 18.5 % — AB (ref 11.5–15.5)
WBC: 9.8 10*3/uL (ref 4.0–10.5)

## 2016-07-27 LAB — POC OCCULT BLOOD, ED: FECAL OCCULT BLD: POSITIVE — AB

## 2016-07-27 LAB — TROPONIN I: TROPONIN I: 1.52 ng/mL — AB (ref <0.03)

## 2016-07-27 LAB — MRSA PCR SCREENING: MRSA by PCR: NEGATIVE

## 2016-07-27 IMAGING — CR DG CHEST 1V PORT
1 series · 1 of 1 positions shown · non-contrast
Comparison: Chest radiograph [DATE]

CLINICAL DATA: Evaluate catheter.

EXAM:
PORTABLE CHEST 1 VIEW

[AP]
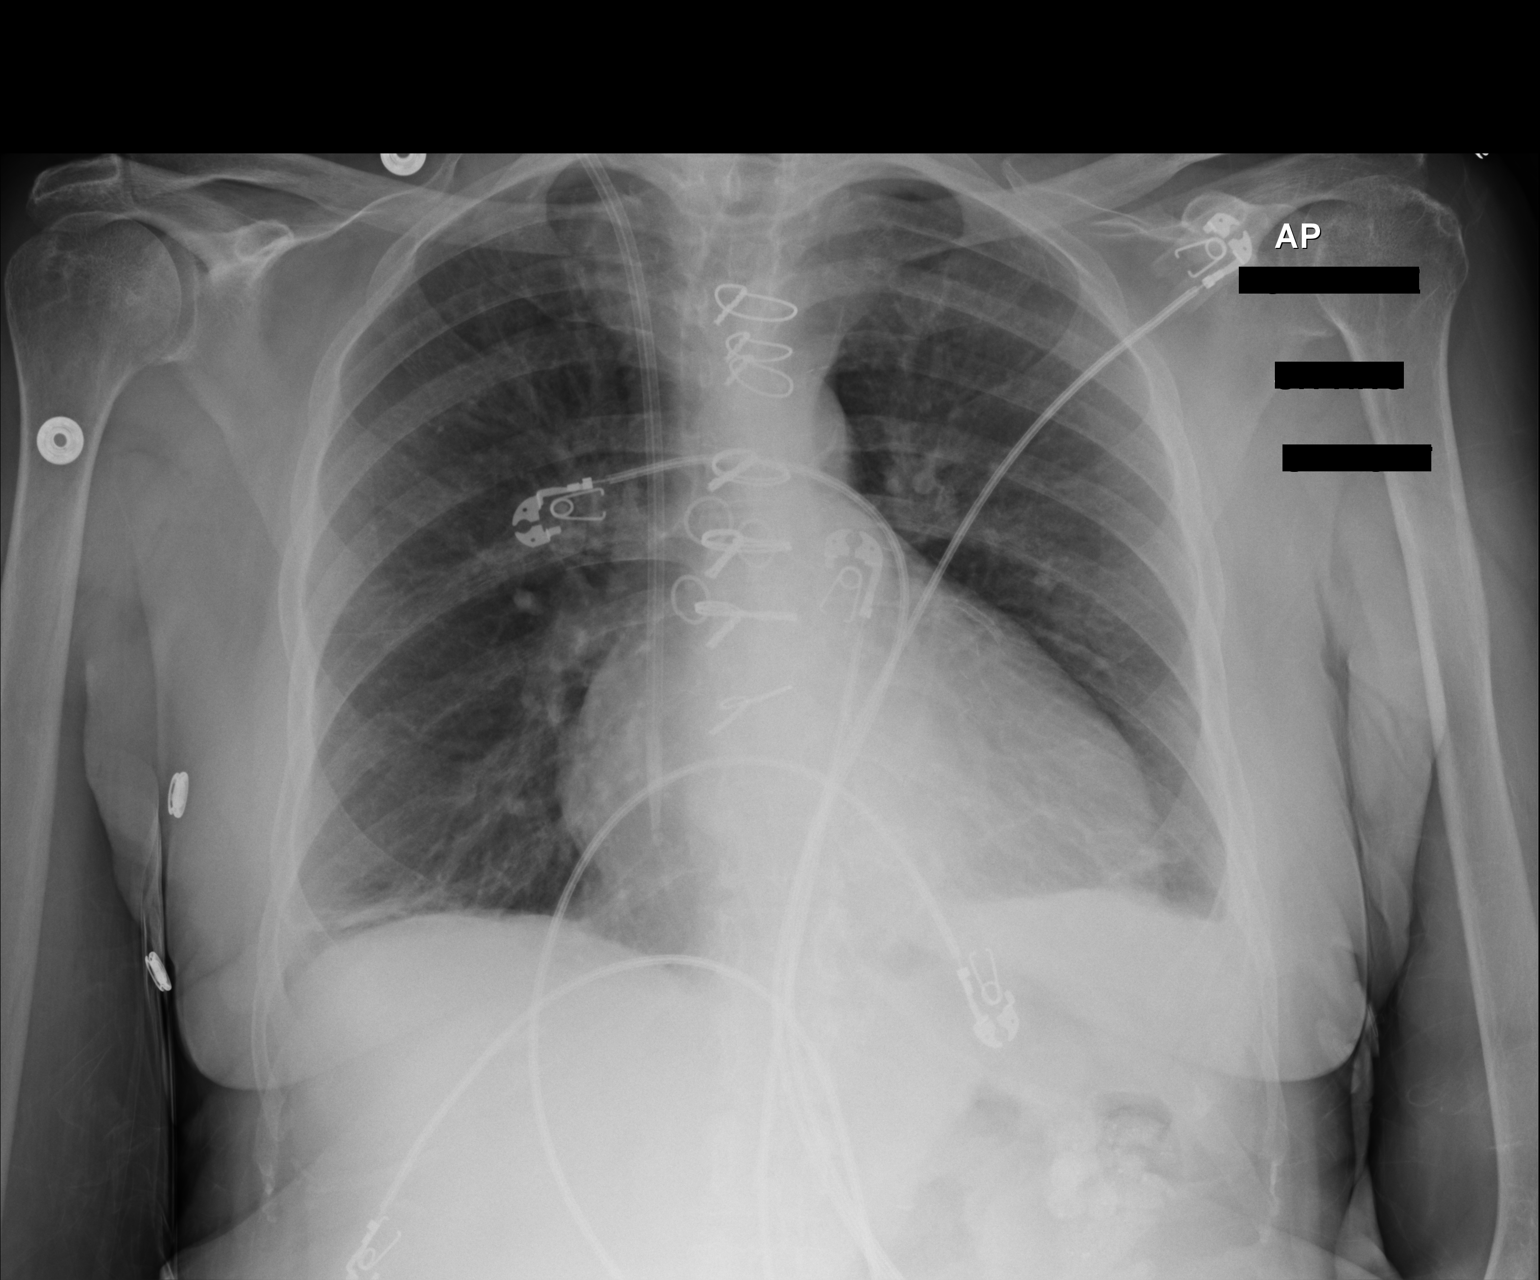

[1 of 1 positions shown; findings below may reference images not displayed]

FINDINGS: Cardiac silhouette is mildly enlarged. Pulmonary vascular
congestion. Mild calcific atherosclerosis aortic knob mild. Status
post median sternotomy for CABG. Coronary artery stent. Stable
appearance of RIGHT internal jugular central venous catheter with
distal tip projecting in proximal atria. No pneumothorax. Bibasilar
strandy densities. Blunting of the LEFT costophrenic angle. No
consolidation. Soft tissue planes and included osseous structures
are nonsuspicious.
IMPRESSION: Stable RIGHT internal jugular central venous catheter distal tip
projecting in RIGHT atrium, recommend 2 cm retraction.

Bibasilar atelectasis and trace LEFT pleural effusion versus pleural
thickening.

Mild cardiomegaly and pulmonary vascular congestion.

## 2016-07-27 IMAGING — DX DG CHEST 2V
2 series · 2 of 2 positions shown · non-contrast
Comparison: [DATE] chest radiograph.

CLINICAL DATA: 75 y/o  F; headache and chills.

EXAM:
CHEST  2 VIEW

[chest pa]
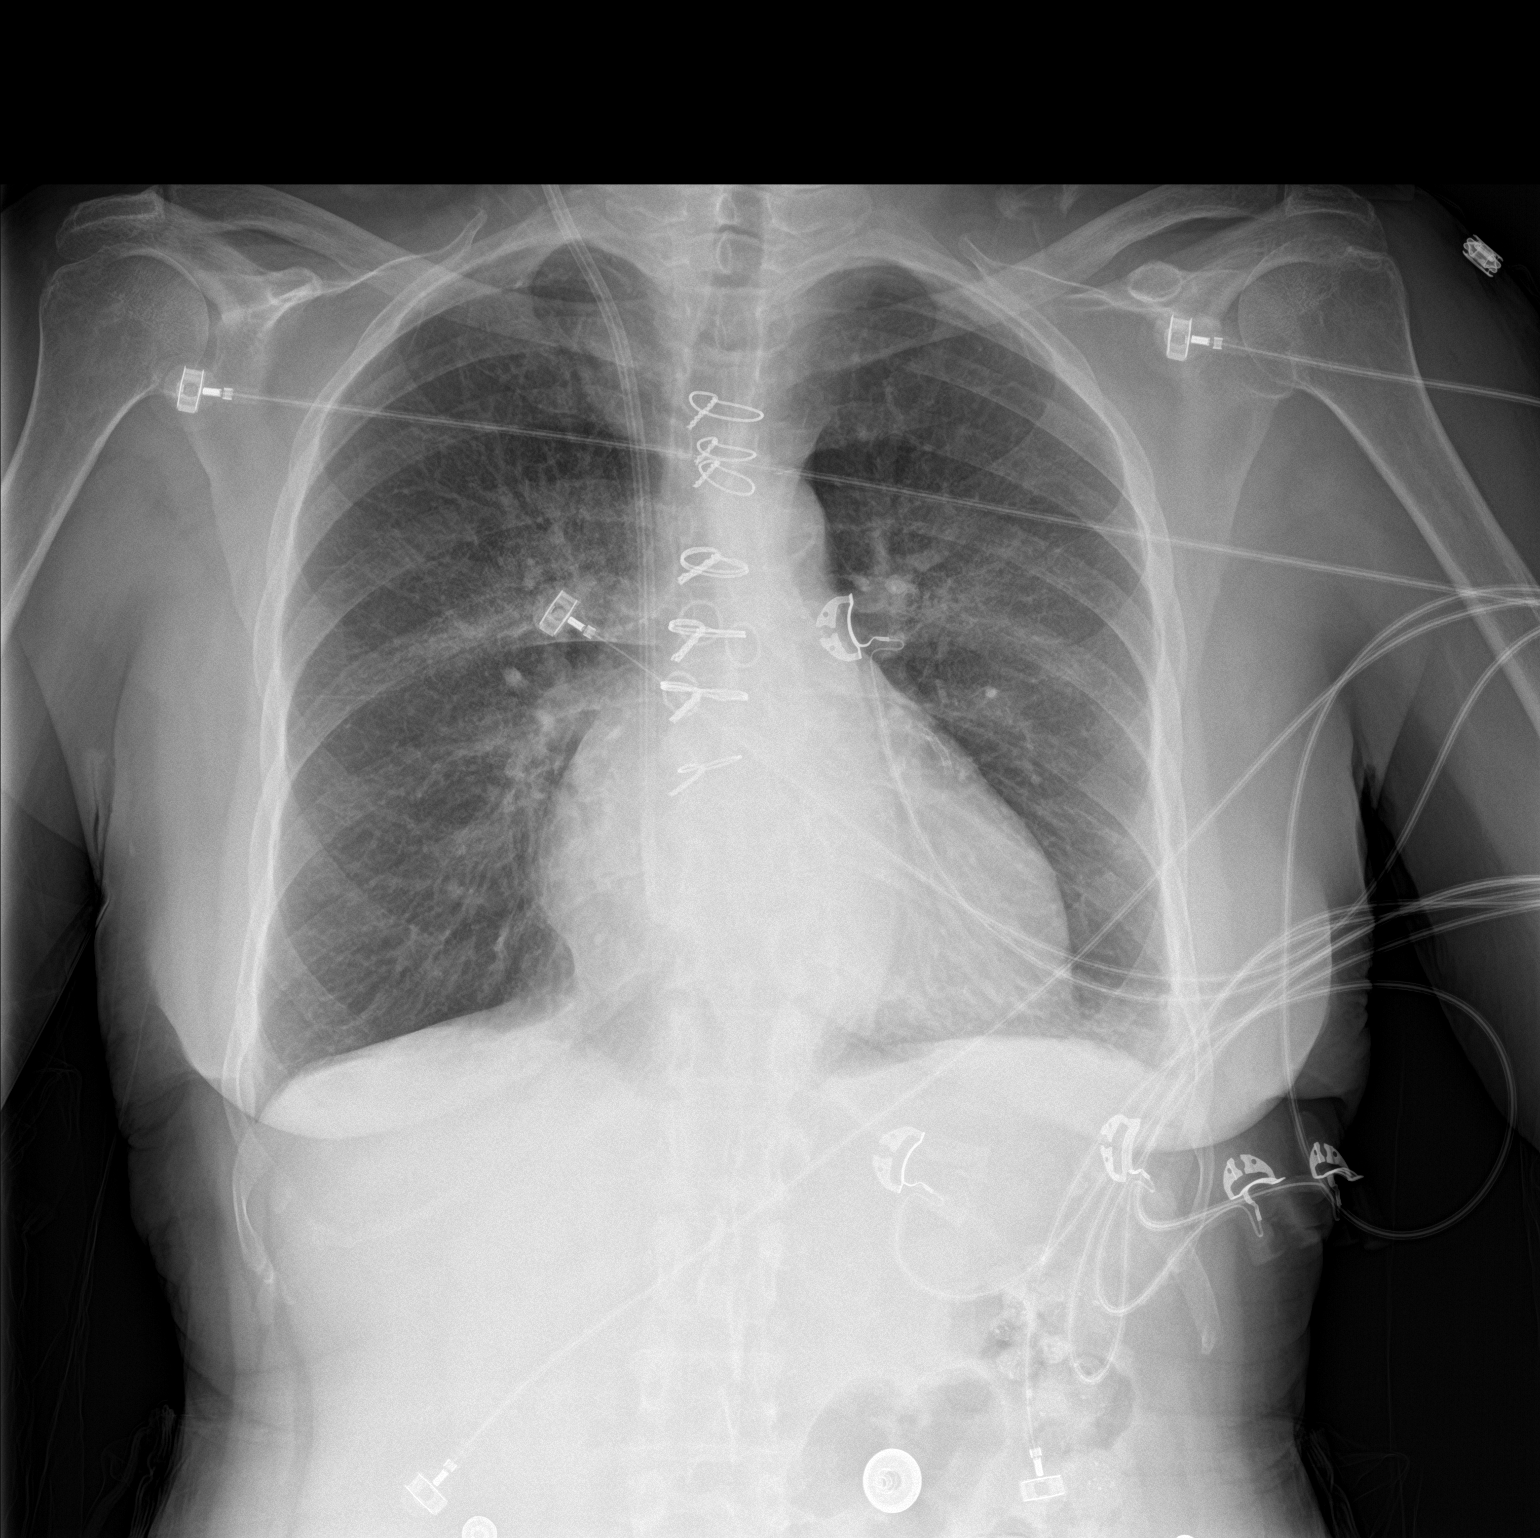

[chest lat]
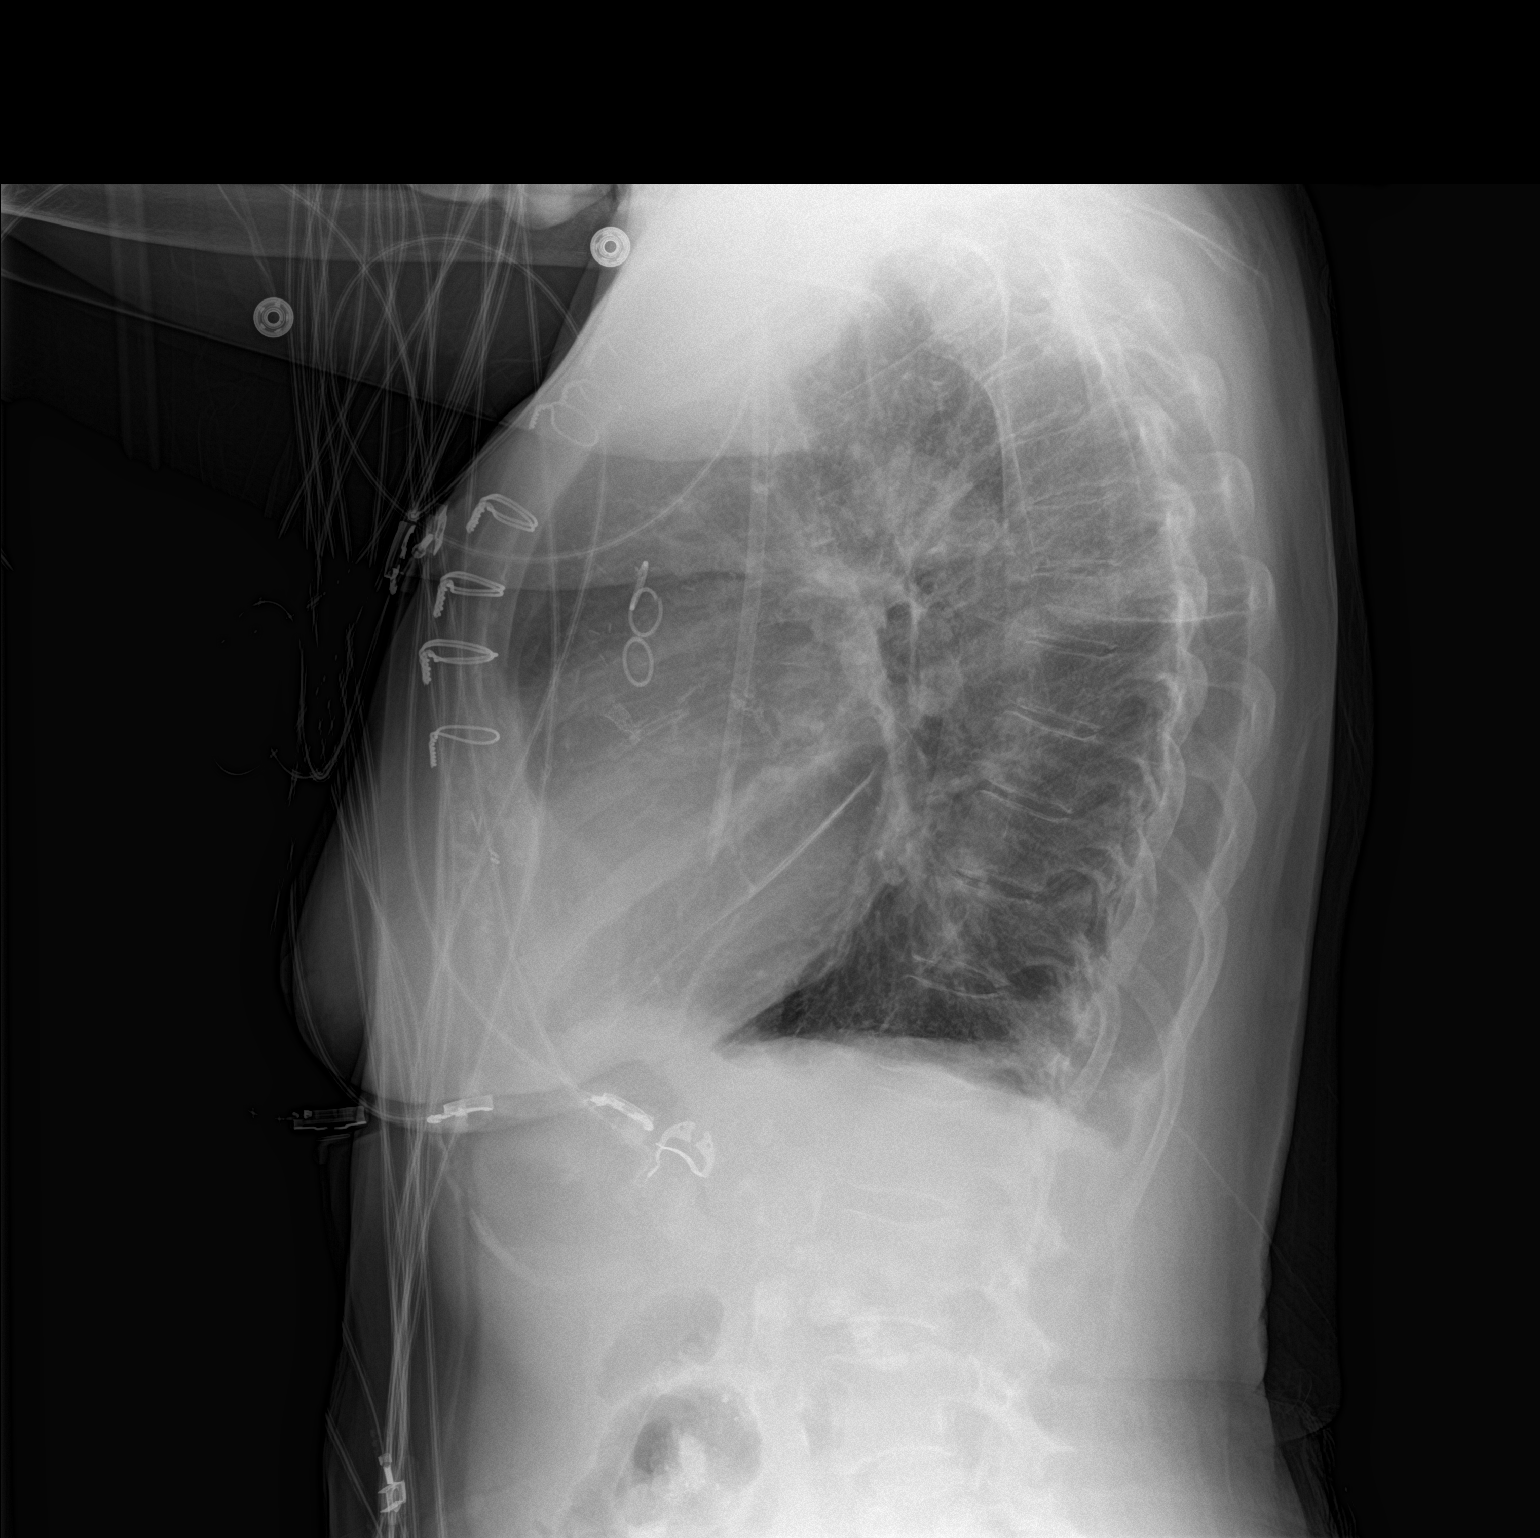

[2 of 2 positions shown; findings below may reference images not displayed]

FINDINGS: Stable cardiac silhouette within normal limits. Right central venous
catheter tip projects over the right atrium. Post median sternotomy
with wires and alignment. Aortic atherosclerosis with calcification.
No acute osseous abnormality is evident. Small left pleural effusion
is slightly increased. Pulmonary venous hypertension. No focal
consolidation. Minor left basilar atelectasis.
IMPRESSION: Small left pleural effusion and minor left basilar atelectasis.
Pulmonary venous hypertension. No focal consolidation.

By: KATHARZYNA M.D.

## 2016-07-27 IMAGING — CT CT HEAD W/O CM
3 series · 16 of 44 positions shown, 19 images · non-contrast
Comparison: CT [DATE]

CLINICAL DATA: Headache.

EXAM:
CT HEAD WITHOUT CONTRAST
TECHNIQUE: Contiguous axial images were obtained from the base of the skull
through the vertex without intravenous contrast.

[Series 2: head trauma wo · axial · 0.41mm/px · z∈[+1789,+1899]mm · 10 of 27 slices shown, 13 images]
[im 3/27  brain]
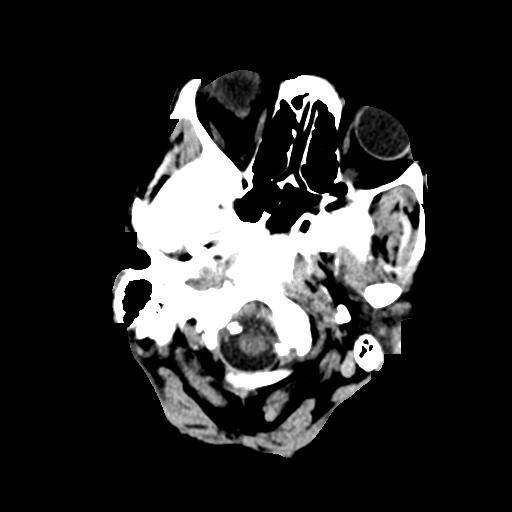
[im 3/27  bone]
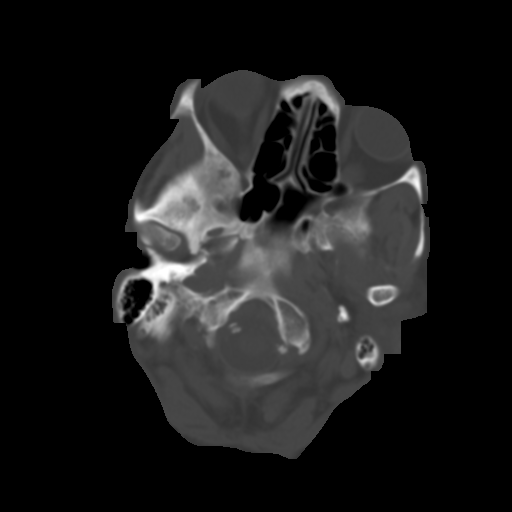
[im 5/27  brain]
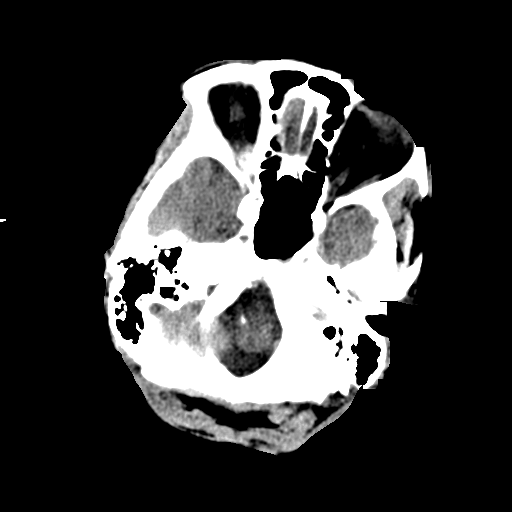
[im 8/27  brain]
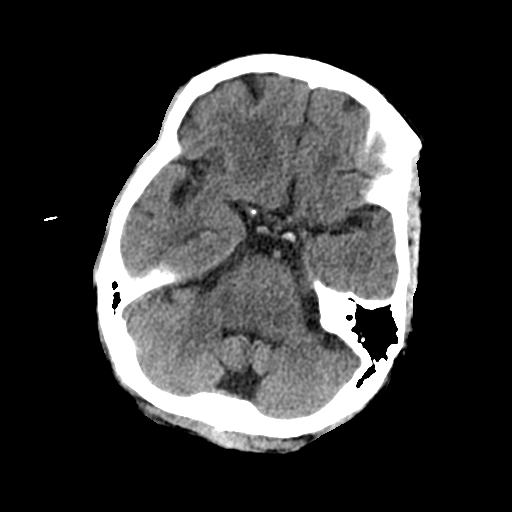
[im 10/27  brain]
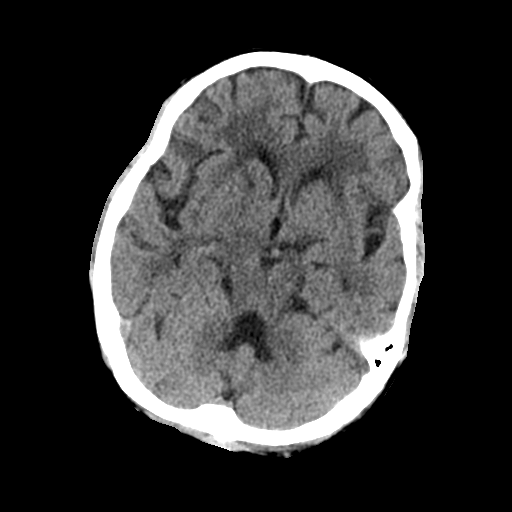
[im 13/27  brain]
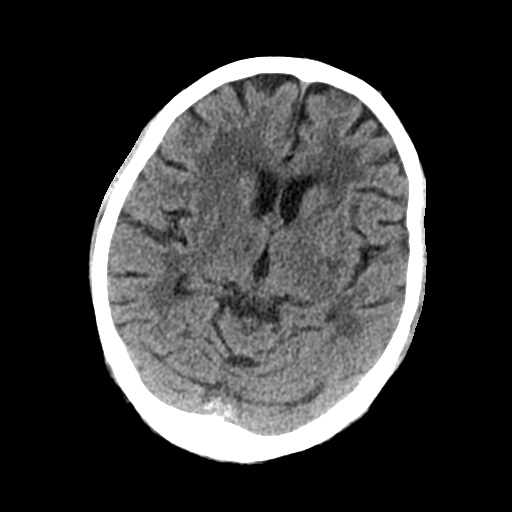
[im 13/27  bone]
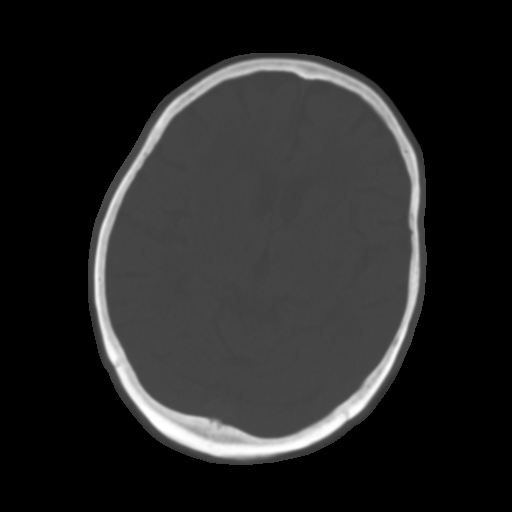
[im 15/27  brain]
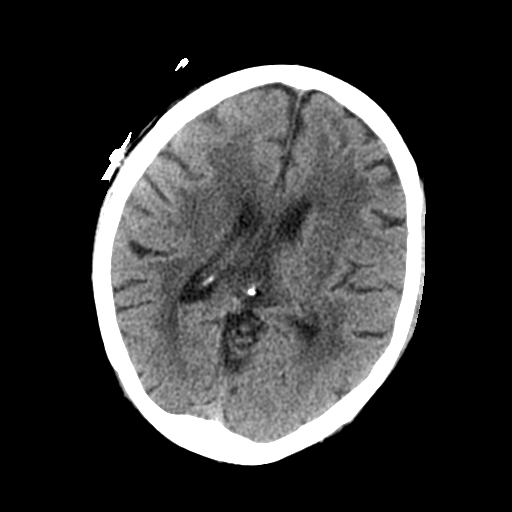
[im 18/27  brain]
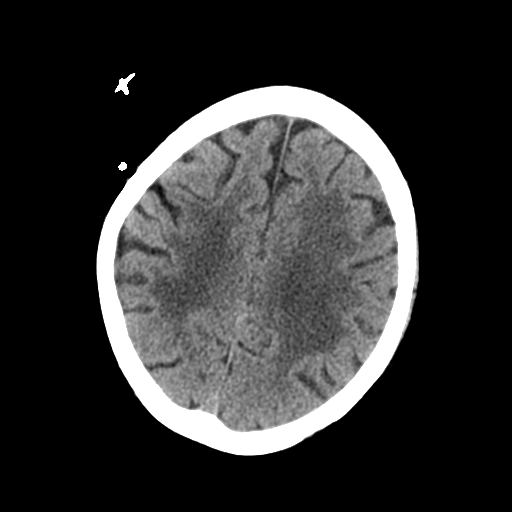
[im 20/27  brain]
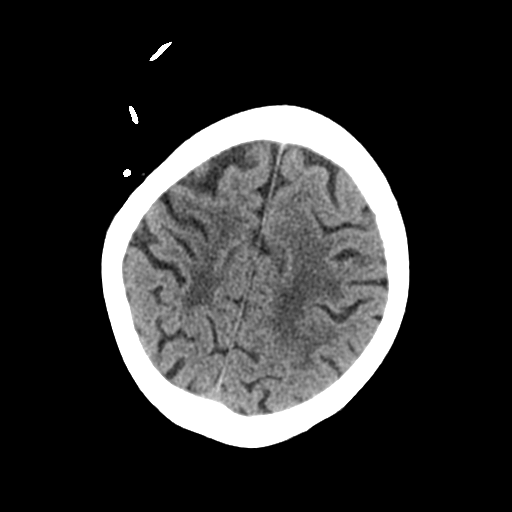
[im 23/27  brain]
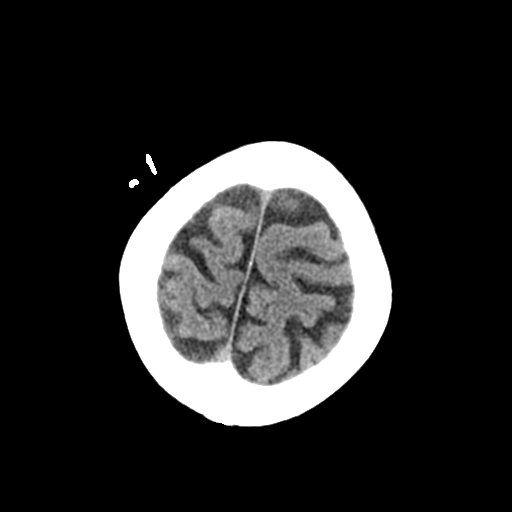
[im 23/27  bone]
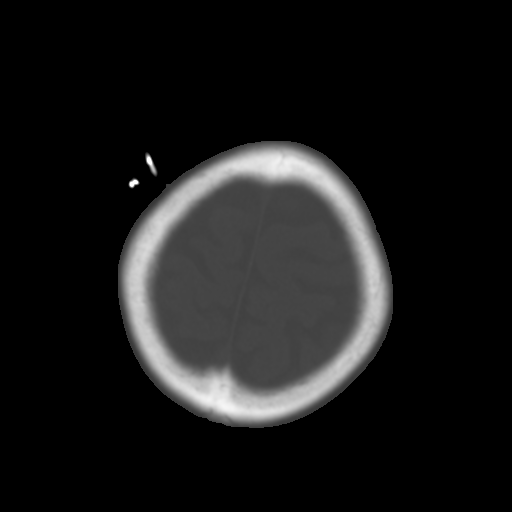
[im 25/27  brain]
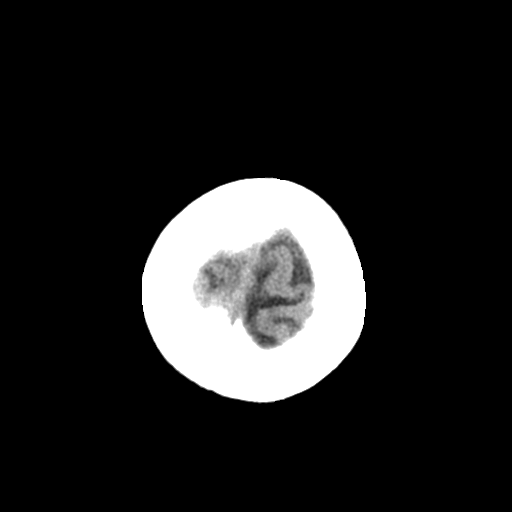

[Series 4: coronal soft tissue · coronal · 0.30mm/px · 3 of 60 slices shown]
[im 20/60  brain]
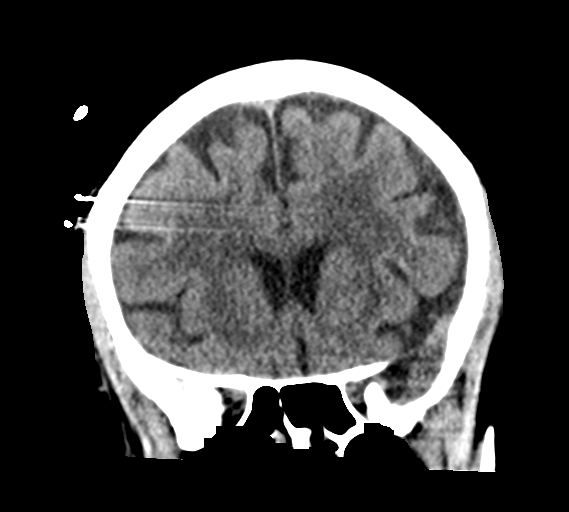
[im 27/60  brain]
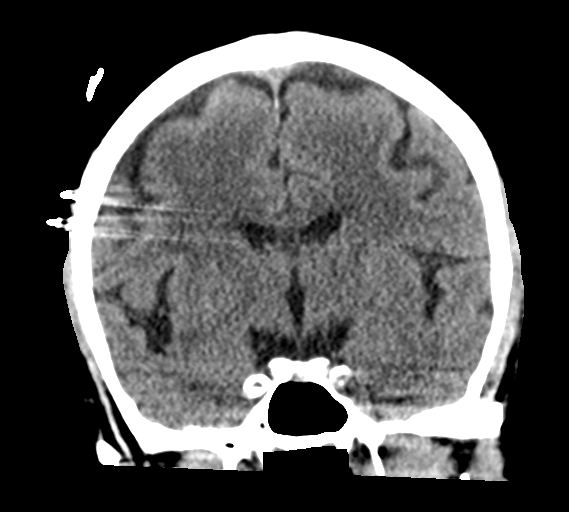
[im 33/60  brain]
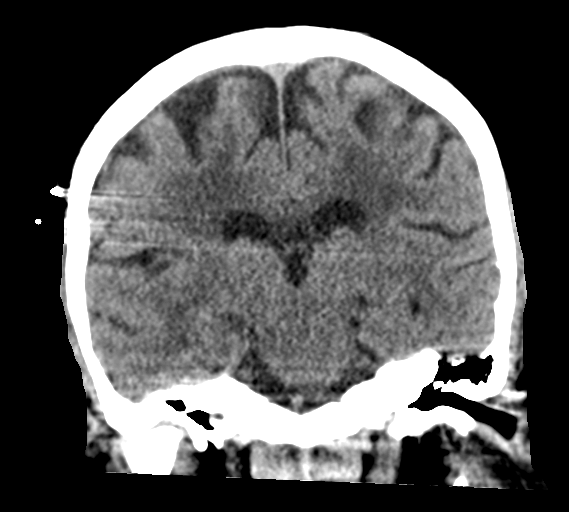

[Series 5: sagittal soft tissue · sagittal · 0.31mm/px · 3 of 51 slices shown]
[im 17/51  brain]
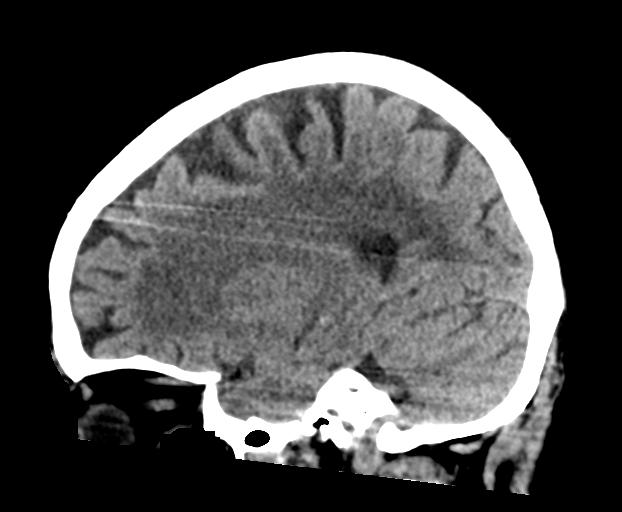
[im 26/51  brain]
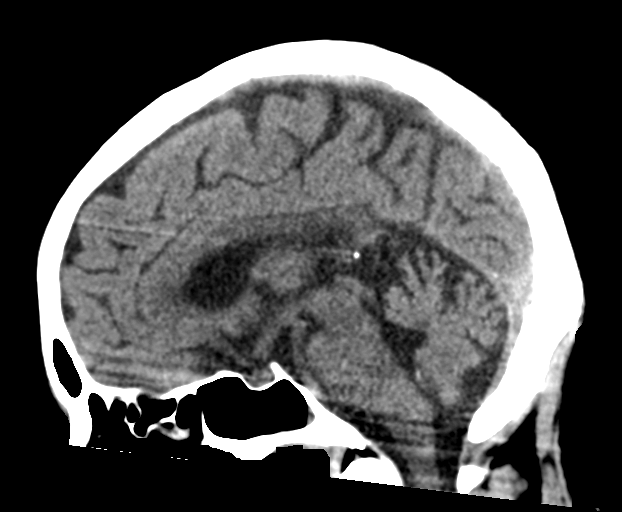
[im 34/51  brain]
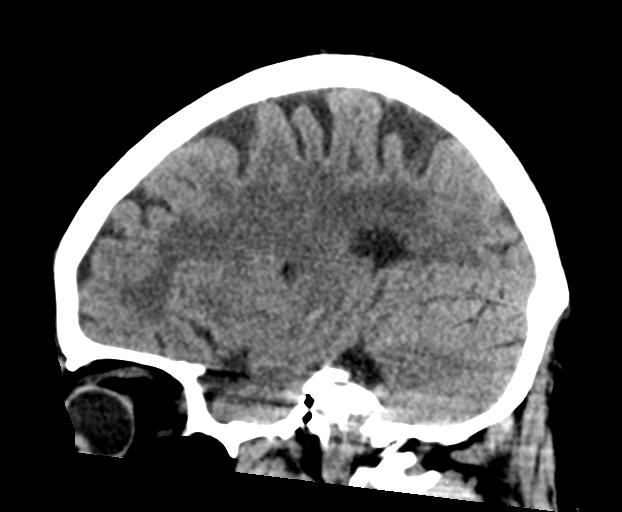

[16 of 44 positions shown; findings below may reference images not displayed]

FINDINGS: Brain: No evidence of acute infarction, hemorrhage, hydrocephalus,
extra-axial collection or mass lesion/mass effect. Stable atrophy
and chronic small vessel ischemia. Chronic lacunar infarct in the
left external capsule.

Vascular: Atherosclerosis of skullbase vasculature without
hyperdense vessel or abnormal calcification.

Skull: Normal. Negative for fracture or focal lesion.

Sinuses/Orbits: Minimal mucosal thickening in left side of sphenoid
sinus. Remaining sinuses are clear. Mastoid air cells are
well-aerated.

Other: None.
IMPRESSION: No acute intracranial abnormality.

Stable atrophy and chronic small vessel ischemia.

## 2016-07-27 MED ORDER — SIMVASTATIN 20 MG PO TABS
20.0000 mg | ORAL_TABLET | Freq: Every day | ORAL | Status: DC
  Administered 2016-07-27 – 2016-08-02 (×6): 20 mg via ORAL
  Filled 2016-07-27 (×6): qty 1

## 2016-07-27 MED ORDER — ALPRAZOLAM 0.5 MG PO TABS: 0.2500 mg | ORAL_TABLET | Freq: Every evening | ORAL | Status: DC | PRN

## 2016-07-27 MED ORDER — ACETAMINOPHEN 500 MG PO TABS
1000.0000 mg | ORAL_TABLET | Freq: Once | ORAL | Status: DC
  Filled 2016-07-27 (×2): qty 2

## 2016-07-27 MED ORDER — NITROGLYCERIN IN D5W 200-5 MCG/ML-% IV SOLN
0.0000 ug/min | INTRAVENOUS | Status: DC
  Administered 2016-07-27: 5 ug/min via INTRAVENOUS
  Filled 2016-07-27: qty 250

## 2016-07-27 MED ORDER — HEPARIN SODIUM (PORCINE) 5000 UNIT/ML IJ SOLN
5000.0000 [IU] | Freq: Three times a day (TID) | INTRAMUSCULAR | Status: DC
Start: 2016-07-27 — End: 2016-07-28
  Filled 2016-07-27: qty 1

## 2016-07-27 MED ORDER — PENTAFLUOROPROP-TETRAFLUOROETH EX AERO: 1.0000 "application " | INHALATION_SPRAY | CUTANEOUS | Status: DC | PRN

## 2016-07-27 MED ORDER — HYDRALAZINE HCL 25 MG PO TABS
50.0000 mg | ORAL_TABLET | Freq: Three times a day (TID) | ORAL | Status: DC
  Administered 2016-07-27 – 2016-08-02 (×15): 50 mg via ORAL
  Filled 2016-07-27: qty 2
  Filled 2016-07-27: qty 1
  Filled 2016-07-27: qty 2
  Filled 2016-07-27 (×2): qty 1
  Filled 2016-07-27 (×4): qty 2
  Filled 2016-07-27: qty 1
  Filled 2016-07-27 (×4): qty 2
  Filled 2016-07-27 (×2): qty 1
  Filled 2016-07-27 (×7): qty 2
  Filled 2016-07-27: qty 1

## 2016-07-27 MED ORDER — ALBUTEROL SULFATE (2.5 MG/3ML) 0.083% IN NEBU: 3.0000 mL | INHALATION_SOLUTION | Freq: Four times a day (QID) | RESPIRATORY_TRACT | Status: DC | PRN

## 2016-07-27 MED ORDER — ISOSORBIDE MONONITRATE ER 60 MG PO TB24
60.0000 mg | ORAL_TABLET | Freq: Every day | ORAL | Status: DC
  Administered 2016-07-27: 60 mg via ORAL
  Filled 2016-07-27 (×4): qty 1

## 2016-07-27 MED ORDER — HEPARIN (PORCINE) IN NACL 100-0.45 UNIT/ML-% IJ SOLN: 14.0000 [IU]/kg/h | Freq: Once | INTRAMUSCULAR | Status: DC

## 2016-07-27 MED ORDER — SODIUM CHLORIDE 0.9% FLUSH
10.0000 mL | INTRAVENOUS | Status: DC | PRN
  Administered 2016-07-31: 10 mL
  Filled 2016-07-27: qty 40

## 2016-07-27 MED ORDER — METOPROLOL TARTRATE 12.5 MG HALF TABLET
12.5000 mg | ORAL_TABLET | Freq: Two times a day (BID) | ORAL | Status: DC
  Administered 2016-07-27 – 2016-08-02 (×10): 12.5 mg via ORAL
  Filled 2016-07-27 (×11): qty 1

## 2016-07-27 MED ORDER — CLONIDINE HCL 0.2 MG PO TABS
0.2000 mg | ORAL_TABLET | Freq: Three times a day (TID) | ORAL | Status: DC
  Administered 2016-07-27 – 2016-08-02 (×13): 0.2 mg via ORAL
  Filled 2016-07-27 (×16): qty 1

## 2016-07-27 MED ORDER — LIDOCAINE HCL (PF) 1 % IJ SOLN: 5.0000 mL | INTRAMUSCULAR | Status: DC | PRN

## 2016-07-27 MED ORDER — SODIUM CHLORIDE 0.9 % IV SOLN
80.0000 mg | Freq: Once | INTRAVENOUS | Status: AC
  Administered 2016-07-27: 80 mg via INTRAVENOUS
  Filled 2016-07-27: qty 80

## 2016-07-27 MED ORDER — AMIODARONE HCL 200 MG PO TABS
200.0000 mg | ORAL_TABLET | Freq: Every day | ORAL | Status: DC
  Administered 2016-07-27 – 2016-08-02 (×6): 200 mg via ORAL
  Filled 2016-07-27 (×10): qty 1

## 2016-07-27 MED ORDER — SODIUM CHLORIDE 0.9 % IV SOLN: 100.0000 mL | INTRAVENOUS | Status: DC | PRN

## 2016-07-27 MED ORDER — ASPIRIN EC 81 MG PO TBEC
81.0000 mg | DELAYED_RELEASE_TABLET | Freq: Every day | ORAL | Status: DC
  Administered 2016-07-27 – 2016-08-02 (×6): 81 mg via ORAL
  Filled 2016-07-27 (×6): qty 1

## 2016-07-27 MED ORDER — ASPIRIN 325 MG PO TABS
325.0000 mg | ORAL_TABLET | Freq: Once | ORAL | Status: AC
Start: 2016-07-27 — End: 2016-07-27
  Administered 2016-07-27: 325 mg via ORAL
  Filled 2016-07-27: qty 1

## 2016-07-27 MED ORDER — ISOSORBIDE MONONITRATE ER 60 MG PO TB24
90.0000 mg | ORAL_TABLET | Freq: Every day | ORAL | Status: DC
  Administered 2016-07-28 – 2016-08-02 (×5): 90 mg via ORAL
  Filled 2016-07-27 (×5): qty 1

## 2016-07-27 MED ORDER — HEPARIN SODIUM (PORCINE) 1000 UNIT/ML DIALYSIS: 1000.0000 [IU] | INTRAMUSCULAR | Status: DC | PRN

## 2016-07-27 MED ORDER — SODIUM CHLORIDE 0.9 % IV SOLN
8.0000 mg/h | INTRAVENOUS | Status: DC
  Administered 2016-07-27: 8 mg/h via INTRAVENOUS
  Filled 2016-07-27 (×6): qty 80

## 2016-07-27 MED ORDER — ALTEPLASE 2 MG IJ SOLR: 2.0000 mg | Freq: Once | INTRAMUSCULAR | Status: DC | PRN

## 2016-07-27 MED ORDER — MENTHOL 3 MG MT LOZG
1.0000 | LOZENGE | OROMUCOSAL | Status: DC | PRN
Start: 2016-07-27 — End: 2016-08-02
  Administered 2016-07-27: 3 mg via ORAL
  Filled 2016-07-27: qty 9

## 2016-07-27 MED ORDER — SODIUM CHLORIDE 0.9% FLUSH
3.0000 mL | Freq: Two times a day (BID) | INTRAVENOUS | Status: DC
  Administered 2016-07-28 – 2016-07-30 (×4): 3 mL via INTRAVENOUS

## 2016-07-27 MED ORDER — RENA-VITE PO TABS
1.0000 | ORAL_TABLET | Freq: Every day | ORAL | Status: DC
  Administered 2016-07-28 – 2016-08-01 (×4): 1 via ORAL
  Filled 2016-07-27 (×7): qty 1

## 2016-07-27 MED ORDER — ALBUTEROL SULFATE HFA 108 (90 BASE) MCG/ACT IN AERS: 1.0000 | INHALATION_SPRAY | Freq: Four times a day (QID) | RESPIRATORY_TRACT | Status: DC | PRN

## 2016-07-27 MED ORDER — LANTHANUM CARBONATE 500 MG PO CHEW
1000.0000 mg | CHEWABLE_TABLET | Freq: Three times a day (TID) | ORAL | Status: DC
Start: 2016-07-27 — End: 2016-08-02
  Administered 2016-07-27 – 2016-08-02 (×11): 1000 mg via ORAL
  Filled 2016-07-27 (×20): qty 2

## 2016-07-27 MED ORDER — FLUTICASONE PROPIONATE 50 MCG/ACT NA SUSP
2.0000 | Freq: Every day | NASAL | Status: DC
  Administered 2016-07-31: 2 via NASAL
  Filled 2016-07-27 (×2): qty 16

## 2016-07-27 MED ORDER — PANTOPRAZOLE SODIUM 40 MG IV SOLR
40.0000 mg | Freq: Two times a day (BID) | INTRAVENOUS | Status: DC
  Filled 2016-07-27: qty 40

## 2016-07-27 MED ORDER — AMLODIPINE BESYLATE 2.5 MG PO TABS
2.5000 mg | ORAL_TABLET | Freq: Every day | ORAL | Status: DC
  Administered 2016-07-27 – 2016-08-01 (×5): 2.5 mg via ORAL
  Filled 2016-07-27 (×5): qty 1

## 2016-07-27 MED ORDER — CINACALCET HCL 30 MG PO TABS
30.0000 mg | ORAL_TABLET | Freq: Every day | ORAL | Status: DC
  Administered 2016-07-27 – 2016-08-02 (×5): 30 mg via ORAL
  Filled 2016-07-27 (×9): qty 1

## 2016-07-27 MED ORDER — LIDOCAINE-PRILOCAINE 2.5-2.5 % EX CREA: 1.0000 "application " | TOPICAL_CREAM | CUTANEOUS | Status: DC | PRN

## 2016-07-27 NOTE — Consult Note (Signed)
Reason for Consult: To manage dialysis and dialysis related needs Referring Physician: Dr. Eddie Dibbles Shawna [name] is an 76 y.o. female.  HPI: Pt is a 65F with ESRD on HD MWF [facility], HTN, HLD, CADs/p CABG and a h/o GI bleeds (AVM of colon) p/w chest pain, hypertensive emergency, and GIB.    Consulted to manage dialysis and dialysis-related needs.  Pt had a recent failed declot of AVF and was dialyzed Tuesday via tempcath. Needs permcath placed and vein mapping for vascular access.  No chest pain now.  Hgb dropping  Dialyzes at [facility] outpatient records forthcoming (have called HD unit).  Past Medical History:  Diagnosis Date  . Anxiety   . Arthritis   . AVM (arteriovenous malformation) of colon   . CAD (coronary artery disease)    a. 12/2011 NSTEMI/Cath/PCI LCX (2.25x14 Resolute DES) & D1 (2.25x22 Resolute DES);  b. 01/2012 Cath/PCI: LM 30, LAD 30p, 40-14m D1 stent ok, 99 in sm branch of diag, LCX patent stent, OM1 20, RCA 95 ost (4.0x12 Promus DES), EF 55%;  c. 04/2012 Lexi Cardiolite  EF 48%, small area of scar @ base/mid inflat wall with mild peri-infarct ischemia.; CABG 12/4  . Carotid artery disease ([place]    a. 668-11%LICA, 95/7262  . Chronic bronchitis ([place]   . Chronic diastolic CHF (congestive heart failure) ([place]    a. 02/2012 Echo EF 60-65%, nl wall motion, Gr 1 DD, mod MR  . Colon cancer ([place] 1992  . Esophageal stricture   . ESRD on hemodialysis ([place]    ESRD due to HTN, started dialysis 2011 [facility]  Access is LUA AVF as of Sept 2014.   .Marland KitchenGERD (gastroesophageal reflux disease)   . High cholesterol 12/2011  . History of blood transfusion 07/2011; 12/2011; 01/2012 X 2; 04/2012  . History of gout   . History of lower GI bleeding   . Hypertension   . Iron deficiency anemia   . Mitral regurgitation    a. Moderate by echo, 02/2012  . Myocardial infarction   . [place]ian cancer ([place] 1992  . Pneumonia ~ 2009  . PUD  (peptic ulcer disease)   . TIA (transient ischemic attack)     Past Surgical History:  Procedure Laterality Date  . ABDOMINAL HYSTERECTOMY  1992  . APPENDECTOMY  06/1990  . AV FISTULA PLACEMENT  07/2009   left upper arm  . COLON RESECTION  1992  . CORONARY ANGIOPLASTY WITH STENT PLACEMENT  12/15/11   "2"  . CORONARY ANGIOPLASTY WITH STENT PLACEMENT  y/2013   "1; makes total of 3" (05/02/2012)  . CORONARY ARTERY BYPASS GRAFT  06/13/2012   Procedure: CORONARY ARTERY BYPASS GRAFTING (CABG);  Surgeon: EGrace Isaac MD;  [place] [facility]  Service: Open Heart Surgery;  Laterality: N/A;  cabg x four;  using left internal mammary artery, and left leg greater saphenous vein harvested endoscopically  . DILATION AND CURETTAGE OF UTERUS    . ESOPHAGOGASTRODUODENOSCOPY  01/20/2012   Procedure: ESOPHAGOGASTRODUODENOSCOPY (EGD);  Surgeon: MLadene Artist MD,FACG;  [facility];  Service: Endoscopy;  Laterality: N/A;  . ESOPHAGOGASTRODUODENOSCOPY N/A 03/26/2013   Procedure: ESOPHAGOGASTRODUODENOSCOPY (EGD);  Surgeon: JIrene Shipper MD;  [facility];  Service: Endoscopy;  Laterality: N/A;  . ESOPHAGOGASTRODUODENOSCOPY N/A 04/30/2015   Procedure: ESOPHAGOGASTRODUODENOSCOPY (EGD);  Surgeon: NRogene Houston MD;  [facility];  Service: Endoscopy;  Laterality: N/A;  1pm - moved to 10/20 @ 1:10  . INTRAOPERATIVE TRANSESOPHAGEAL ECHOCARDIOGRAM  06/13/2012   Procedure: INTRAOPERATIVE TRANSESOPHAGEAL ECHOCARDIOGRAM;  Surgeon: Grace Isaac, MD;  Location: [place];  Service: Open Heart Surgery;  Laterality: N/A;  . IR GENERIC HISTORICAL  07/26/2016   IR FLUORO GUIDE CV LINE RIGHT 07/26/2016 Greggory Keen, MD [facility]INTER[facility]  . IR GENERIC HISTORICAL  07/26/2016   IR US GUIDE VASC ACCESS RIGHT 07/26/2016 Greggory Keen, MD [facility]  . LEFT HEART CATHETERIZATION WITH CORONARY ANGIOGRAM N/A 12/15/2011   Procedure: LEFT HEART CATHETERIZATION WITH CORONARY ANGIOGRAM;  Surgeon: Burnell Blanks, MD;  Location: [facility] CATH LAB;  Service: Cardiovascular;  Laterality: N/A;  . LEFT HEART CATHETERIZATION WITH CORONARY ANGIOGRAM N/A 01/10/2012   Procedure: LEFT HEART CATHETERIZATION WITH CORONARY ANGIOGRAM;  Surgeon: Peter M Martinique, MD;  Location: [facility] CATH LAB;  Service: Cardiovascular;  Laterality: N/A;  . LEFT HEART CATHETERIZATION WITH CORONARY ANGIOGRAM N/A 06/08/2012   Procedure: LEFT HEART CATHETERIZATION WITH CORONARY ANGIOGRAM;  Surgeon: Burnell Blanks, MD;  Location: [facility] CATH LAB;  Service: Cardiovascular;  Laterality: N/A;  . LEFT HEART CATHETERIZATION WITH CORONARY/GRAFT ANGIOGRAM N/A 12/10/2013   Procedure: LEFT HEART CATHETERIZATION WITH Beatrix Fetters;  Surgeon: [name], MD;  Location: [facility] CATH LAB;  Service: Cardiovascular;  Laterality: N/A;  . OVARY SURGERY     ovarian cancer  . SHUNTOGRAM N/A 10/15/2013   Procedure: Fistulogram;  Surgeon: Serafina Mitchell, MD;  Location: [facility] CATH LAB;  Service: Cardiovascular;  Laterality: N/A;  . THROMBECTOMY / ARTERIOVENOUS GRAFT REVISION  2011   left upper arm  . TUBAL LIGATION  1980's    Family History  Problem Relation Age of Onset  . Heart disease Mother     Heart Disease before age 79  . Hyperlipidemia Mother   . Hypertension Mother   . Diabetes Mother   . Heart attack Mother   . Heart disease Father     Heart Disease before age 13  . Hyperlipidemia Father   . Hypertension Father   . Diabetes Father   . Diabetes Sister   . Hypertension Sister   . Diabetes Brother   . Hyperlipidemia Brother   . Heart attack Brother   . Hypertension Sister   . Heart attack Brother   . Other      noncontributory for early CAD  . Colon cancer Neg Hx   . Esophageal cancer Neg Hx   . Liver disease Neg Hx   . Kidney disease Neg Hx   . Colon polyps Neg Hx     Social History:  reports that she has never smoked. She has never used smokeless tobacco. She reports that she does not drink alcohol or use  drugs.  Allergies: multiple allergies- see list in EPIC   Medications: I have reviewed the patient's current medications.  Results for orders placed or performed during the hospital encounter of 07/27/16 (from the past 48 hour(s))  CBC with Differential     Status: Abnormal   Collection Time: 07/27/16  3:55 AM  Result Value Ref Range   WBC 11.7 (H) 4.0 - 10.5 K/uL   RBC 2.70 (L) 3.87 - 5.11 MIL/uL   Hemoglobin 8.6 (L) 12.0 - 15.0 g/dL   HCT 27.2 (L) 36.0 - 46.0 %   MCV 100.7 (H) 78.0 - 100.0 fL   MCH 31.9 26.0 - 34.0 pg   MCHC 31.6 30.0 - 36.0 g/dL   RDW 18.2 (H) 11.5 - 15.5 %  Platelets 303 150 - 400 K/uL   Neutrophils Relative % 87 %   Neutro Abs 10.2 (H) 1.7 - 7.7 K/uL   Lymphocytes Relative 4 %   Lymphs Abs 0.5 (L) 0.7 - 4.0 K/uL   Monocytes Relative 9 %   Monocytes Absolute 1.0 0.1 - 1.0 K/uL   Eosinophils Relative 0 %   Eosinophils Absolute 0.0 0.0 - 0.7 K/uL   Basophils Relative 0 %   Basophils Absolute 0.0 0.0 - 0.1 K/uL  Basic metabolic panel     Status: Abnormal   Collection Time: 07/27/16  3:55 AM  Result Value Ref Range   Sodium 134 (L) 135 - 145 mmol/L   Potassium 4.3 3.5 - 5.1 mmol/L   Chloride 92 (L) 101 - 111 mmol/L   CO2 26 22 - 32 mmol/L   Glucose, Bld 147 (H) 65 - 99 mg/dL   BUN 47 (H) 6 - 20 mg/dL   Creatinine, Ser 7.99 (H) 0.44 - 1.00 mg/dL   Calcium 8.8 (L) 8.9 - 10.3 mg/dL   GFR calc non Af Amer 4 (L) >60 mL/min   GFR calc Af Amer 5 (L) >60 mL/min    Comment: (NOTE) The eGFR has been calculated using the CKD EPI equation. This calculation has not been validated in all clinical situations. eGFR's persistently <60 mL/min signify possible Chronic Kidney Disease.    Anion gap 16 (H) 5 - 15  Troponin I     Status: Abnormal   Collection Time: 07/27/16  3:55 AM  Result Value Ref Range   Troponin I 1.52 (HH) <0.03 ng/mL    Comment: CRITICAL RESULT CALLED TO, READ BACK BY AND VERIFIED WITH: VICK,C AT 5:00AM ON 07/27/16 BY FESTERMAN,C    Hemoglobin and hematocrit, blood     Status: Abnormal   Collection Time: 07/27/16  5:46 AM  Result Value Ref Range   Hemoglobin 9.2 (L) 12.0 - 15.0 g/dL   HCT 28.5 (L) 36.0 - 46.0 %  POC occult blood, ED     Status: Abnormal   Collection Time: 07/27/16  6:20 AM  Result Value Ref Range   Fecal Occult Bld POSITIVE (A) NEGATIVE  MRSA PCR Screening     Status: None   Collection Time: 07/27/16  2:29 PM  Result Value Ref Range   MRSA by PCR NEGATIVE NEGATIVE    Comment:        The GeneXpert MRSA Assay (FDA approved for NASAL specimens only), is one component of a comprehensive MRSA colonization surveillance program. It is not intended to diagnose MRSA infection nor to guide or monitor treatment for MRSA infections.   Renal function panel     Status: Abnormal   Collection Time: 07/27/16  4:40 PM  Result Value Ref Range   Sodium 137 135 - 145 mmol/L   Potassium 4.6 3.5 - 5.1 mmol/L    Comment: DELTA CHECK NOTED   Chloride 99 (L) 101 - 111 mmol/L   CO2 26 22 - 32 mmol/L   Glucose, Bld 97 65 - 99 mg/dL   BUN 53 (H) 6 - 20 mg/dL   Creatinine, Ser 9.11 (H) 0.44 - 1.00 mg/dL    Comment: DELTA CHECK NOTED   Calcium 8.1 (L) 8.9 - 10.3 mg/dL   Phosphorus 6.7 (H) 2.5 - 4.6 mg/dL   Albumin 2.8 (L) 3.5 - 5.0 g/dL   GFR calc non Af Amer 4 (L) >60 mL/min   GFR calc Af Amer 4 (L) >60 mL/min    Comment: (  NOTE) The eGFR has been calculated using the CKD EPI equation. This calculation has not been validated in all clinical situations. eGFR's persistently <60 mL/min signify possible Chronic Kidney Disease.    Anion gap 12 5 - 15  CBC     Status: Abnormal   Collection Time: 07/27/16  4:40 PM  Result Value Ref Range   WBC 9.8 4.0 - 10.5 K/uL   RBC 2.58 (L) 3.87 - 5.11 MIL/uL   Hemoglobin 8.1 (L) 12.0 - 15.0 g/dL    Comment: REPEATED TO VERIFY SPECIMEN CHECKED FOR CLOTS DELTA CHECK NOTED    HCT 25.8 (L) 36.0 - 46.0 %   MCV 100.0 78.0 - 100.0 fL   MCH 31.4 26.0 - 34.0 pg   MCHC 31.4  30.0 - 36.0 g/dL   RDW 18.5 (H) 11.5 - 15.5 %   Platelets 258 150 - 400 K/uL  Type and screen [place]     Status: None (Preliminary result)   Collection Time: 07/27/16  4:40 PM  Result Value Ref Range   ABO/RH(D) O POS    Antibody Screen NEG    Sample Expiration 07/30/2016    Unit Number [id number]    Blood Component Type RED CELLS,LR    Unit division 00    Status of Unit ALLOCATED    Transfusion Status OK TO TRANSFUSE    Crossmatch Result COMPATIBLE    Unit Number [id number]    Blood Component Type RED CELLS,LR    Unit division 00    Status of Unit ALLOCATED    Transfusion Status OK TO TRANSFUSE    Crossmatch Result COMPATIBLE   Hemoglobin and hematocrit, blood     Status: Abnormal   Collection Time: 07/28/16  2:32 AM  Result Value Ref Range   Hemoglobin 7.6 (L) 12.0 - 15.0 g/dL   HCT 24.2 (L) 36.0 - 46.0 %  Troponin I (q 6hr x 3)     Status: Abnormal   Collection Time: 07/28/16  2:32 AM  Result Value Ref Range   Troponin I 2.59 (HH) <0.03 ng/mL    Comment: CRITICAL RESULT CALLED TO, READ BACK BY AND VERIFIED WITH: WOFFORD R,RN 07/28/16 0322 WAYK     Dg Chest 2 View  Result Date: 07/27/2016 CLINICAL DATA:  76 y/o  F; headache and chills. EXAM: CHEST  2 VIEW COMPARISON:  07/16/2016 chest radiograph. FINDINGS: Stable cardiac silhouette within normal limits. Right central venous catheter tip projects over the right atrium. Post median sternotomy with wires and alignment. Aortic atherosclerosis with calcification. No acute osseous abnormality is evident. Small left pleural effusion is slightly increased. Pulmonary venous hypertension. No focal consolidation. Minor left basilar atelectasis. IMPRESSION: Small left pleural effusion and minor left basilar atelectasis. Pulmonary venous hypertension. No focal consolidation. Electronically Signed   By: Kristine Garbe M.D.   On: 07/27/2016 04:38   Ct Head Wo Contrast  Result Date: 07/27/2016 CLINICAL  DATA:  Headache. EXAM: CT HEAD WITHOUT CONTRAST TECHNIQUE: Contiguous axial images were obtained from the base of the skull through the vertex without intravenous contrast. COMPARISON:  CT 07/08/2016 FINDINGS: Brain: No evidence of acute infarction, hemorrhage, hydrocephalus, extra-axial collection or mass lesion/mass effect. Stable atrophy and chronic small vessel ischemia. Chronic lacunar infarct in the left external capsule. Vascular: Atherosclerosis of skullbase vasculature without hyperdense vessel or abnormal calcification. Skull: Normal. Negative for fracture or focal lesion. Sinuses/Orbits: Minimal mucosal thickening in left side of sphenoid sinus. Remaining sinuses are clear. Mastoid air cells are well-aerated. Other:  None. IMPRESSION: No acute intracranial abnormality. Stable atrophy and chronic small vessel ischemia. Electronically Signed   By: Jeb Levering M.D.   On: 07/27/2016 04:40   Ir Fluoro Guide Cv Line Right  Result Date: 07/26/2016 INDICATION: End-stage renal disease, clotted AV fistula, hyperkalemia EXAM: NON TUNNELED CENTRAL VENOUS HEMODIALYSIS CATHETER WITH ULTRASOUND FLUOROSCOPIC GUIDANCE MEDICATIONS: 1% lidocaine locally ANESTHESIA/SEDATION: Moderate (conscious) sedation was employed during this procedure. A total of Versed 0.5 mg was administered intravenously. Moderate Sedation Time: 15 minutes. The patient's level of consciousness and vital signs were monitored continuously by radiology nursing throughout the procedure under my direct supervision. FLUOROSCOPY TIME:  Fluoroscopy Time: 0 minutes 24 seconds (1 mGy). COMPLICATIONS: None immediate. PROCEDURE: Informed written consent was obtained from the patient after a discussion of the risks, benefits, and alternatives to treatment. Questions regarding the procedure were encouraged and answered. The right neck and chest were prepped with chlorhexidine in a sterile fashion, and a sterile drape was applied covering the operative  field. Maximum barrier sterile technique with sterile gowns and gloves were used for the procedure. A timeout was performed prior to the initiation of the procedure. After creating a small venotomy incision, a micropuncture kit was utilized to access the right internal jugular vein under direct, real-time ultrasound guidance after the overlying soft tissues were anesthetized with 1% lidocaine with epinephrine. Ultrasound image documentation was performed. Micro guidewire advanced. Four [place] dilator inserted. Amplatz guidewire advanced into the IVC. Tract dilatation performed to insert a 20 cm temporary dialysis catheter. Tip position at the SVC RA junction. Catheter secured with a Prolene suture and a sterile dressing. Blood aspirated easily followed by saline and heparin flushes. External caps applied. Patient tolerated the procedure well. No immediate complication. IMPRESSION: Successful ultrasound and fluoroscopic insertion of a 20 cm temporary right IJ dialysis catheter. Tip SVC RA junction. Ready for use. Electronically Signed   By: Jerilynn Mages.  Shick M.D.   On: 07/26/2016 15:28   Ir US Guide Vasc Access Right  Result Date: 07/26/2016 INDICATION: End-stage renal disease, clotted AV fistula, hyperkalemia EXAM: NON TUNNELED CENTRAL VENOUS HEMODIALYSIS CATHETER WITH ULTRASOUND FLUOROSCOPIC GUIDANCE MEDICATIONS: 1% lidocaine locally ANESTHESIA/SEDATION: Moderate (conscious) sedation was employed during this procedure. A total of Versed 0.5 mg was administered intravenously. Moderate Sedation Time: 15 minutes. The patient's level of consciousness and vital signs were monitored continuously by radiology nursing throughout the procedure under my direct supervision. FLUOROSCOPY TIME:  Fluoroscopy Time: 0 minutes 24 seconds (1 mGy). COMPLICATIONS: None immediate. PROCEDURE: Informed written consent was obtained from the patient after a discussion of the risks, benefits, and alternatives to treatment. Questions regarding  the procedure were encouraged and answered. The right neck and chest were prepped with chlorhexidine in a sterile fashion, and a sterile drape was applied covering the operative field. Maximum barrier sterile technique with sterile gowns and gloves were used for the procedure. A timeout was performed prior to the initiation of the procedure. After creating a small venotomy incision, a micropuncture kit was utilized to access the right internal jugular vein under direct, real-time ultrasound guidance after the overlying soft tissues were anesthetized with 1% lidocaine with epinephrine. Ultrasound image documentation was performed. Micro guidewire advanced. Four [place] dilator inserted. Amplatz guidewire advanced into the IVC. Tract dilatation performed to insert a 20 cm temporary dialysis catheter. Tip position at the SVC RA junction. Catheter secured with a Prolene suture and a sterile dressing. Blood aspirated easily followed by saline and heparin flushes. External caps applied. Patient tolerated the  procedure well. No immediate complication. IMPRESSION: Successful ultrasound and fluoroscopic insertion of a 20 cm temporary right IJ dialysis catheter. Tip SVC RA junction. Ready for use. Electronically Signed   By: Jerilynn Mages.  Shick M.D.   On: 07/26/2016 15:28   Dg Chest Port 1 View  Result Date: 07/27/2016 CLINICAL DATA:  Evaluate catheter. EXAM: PORTABLE CHEST 1 VIEW COMPARISON:  Chest radiograph July 27, 2016 FINDINGS: Cardiac silhouette is mildly enlarged. Pulmonary vascular congestion. Mild calcific atherosclerosis aortic knob mild. Status post median sternotomy for CABG. Coronary artery stent. Stable appearance of RIGHT internal jugular central venous catheter with distal tip projecting in proximal atria. No pneumothorax. Bibasilar strandy densities. Blunting of the LEFT costophrenic angle. No consolidation. Soft tissue planes and included osseous structures are nonsuspicious. IMPRESSION: Stable RIGHT internal  jugular central venous catheter distal tip projecting in RIGHT atrium, recommend 2 cm retraction. Bibasilar atelectasis and trace LEFT pleural effusion versus pleural thickening. Mild cardiomegaly and pulmonary vascular congestion. Electronically Signed   By: Elon Alas M.D.   On: 07/27/2016 17:48    ROS: all other systems reviewed and are negative except as per HPI Blood pressure (!) 144/66, pulse 76, temperature 98.1 F (36.7 C), temperature source Other (Comment), resp. rate 15, height 5' 2"  (1.575 m), weight 57.9 kg (127 lb 9.6 oz), SpO2 98 %. .  GEN: NAD, appears younger than stated age 5: EOMI, PERRL, MMM NECK: no JVD PULM: clear CV RRR loud S2 ABD benign EXT trace LE edema   Assessment/Plan: 1 Chest pain/ NSTEMI: trops elevated, may need cardiac cath.  Per cardiology.   2.  GIB: serial H/H, possibly needs scope?  Per GI 2 ESRD: MWF normally, will dialyze off sched today (thursday) 3 Hypertension: initially had hypertensive urgency, pressures better with resumption of home meds 4. Anemia of ESRD: Will need to increase ESA 5. Metabolic Bone Disease: fosrenol as binder, will give hectorol if indicated 6.  Access, TDC, vein mapping, potential VVS c/s  Ellison Rieth 07/28/2016, 7:09 AM

## 2016-07-27 NOTE — ED Notes (Signed)
Dr Dina Rich made aware that pt refused tylenol and that pt says her HA is almost gone, rates 1 out of 10 on pain scale

## 2016-07-27 NOTE — ED Notes (Signed)
Pt was transported to [facility] by [place].  Pt had signed EMTALA and agreed to transport.  Report was given to RN on 3W including informing her that ordered labs had not been able to be drawn prior to transfer due to inability to obtain blood sample (difficult stick) and pt refusal to be stuck any further.  Also informed her that the right wrist IV while functioning could not be fully advanced and IV team consult should be sought on arrival to [facility].  DIscussed that pt usually gets M,W,F HD treatments, anticipate treatment for today.  No current orders.

## 2016-07-27 NOTE — ED Notes (Signed)
CRITICAL VALUE ALERT  Critical value received:  Troponin 1.52  Date of notification:  07/27/2016  Time of notification:  0500  Critical value read back:Yes.    Nurse who received alert:  Charlies Silvers RN  MD notified (1st page):  [place]  Time of first page:  0500  MD notified (2nd page):  Time of second page:  Responding MD:  Horton  Time MD responded:  0500

## 2016-07-27 NOTE — ED Notes (Signed)
Care of patient assumed at 0700. Patient alert, oriented. Patient denies headache, chest pain at present.

## 2016-07-27 NOTE — ED Notes (Signed)
Patient IV hurting and not flushing per Magda Paganini, RN. Attempted another IV. 18 g IV in right bicep tape removed and IV manipulated. IV flushes easily after pulling back on catheter. IV site not swelling. Pain is minimal and unable to determine if patient is having site tenderness or if internal pain. IV flushed several times and patient states pain was improved. Again, flushes easily and no site swelling noted. Magda Paganini, RN assessed site for second opinion and IV seems to be working properly. Will continue to monitor site closely.

## 2016-07-27 NOTE — ED Notes (Signed)
Dr Marin Comment in to assess patient this morning. Patient with 1 episode of hypotension. Dr Marin Comment orders to d/c nitroglycerin drip.

## 2016-07-27 NOTE — ED Notes (Signed)
[facility] to pick pt up to transport to [facility] for hospital admission, they will advise once they are close. Timing adjusted to the inclement weather.

## 2016-07-27 NOTE — ED Notes (Addendum)
Consulted with Dr Marin Comment on protonix allergy. Dr Marin Comment aware and state benefit outweighs risk. Advised patient and explained plan of care and that at this point, medication was best option. Patient agrees. Protonix initiated per [facility] and patient advised of s/s allergic reaction and to notify staff immediately if occurs.

## 2016-07-27 NOTE — ED Provider Notes (Signed)
[facility] DEPT Provider Note   CSN: [id number] Arrival date & time: 07/27/16  0257     History   Chief Complaint Chief Complaint  Patient presents with  . Headache    HPI Shawna [name] is a 76 y.o. female.  HPI  This is a 76 year old female with a history of coronary artery disease, end-stage renal disease on dialysis, hypertension who presents with headache and chest pain. Patient states that she had a tunnel catheter placed yesterday for dialysis access. She had a partial dialysis session. She states that she went home and laid down. She had onset of headache. She took her blood pressure and her blood pressure was noted to be 250s over 140s. She took 1 clonidine. She continued to have headache. She states that she took an additional clonidine 2 hours later for persistently elevated blood pressures. During that time she reports chest pressure and some shortness of breath. Currently she is chest pain-free and reports that her headache has improved to 5 out of 10. She denies any weakness, numbness, tingling. She denies any fevers. She denies any neurologic symptoms.  Chart review reveals multiple admissions for hypertensive emergency. She also had a PEA arrest from hyperkalemia in November.    Last stent 2013. She had a stress test in 2016 which was low risk.  Past Medical History:  Diagnosis Date  . Anxiety   . Arthritis   . AVM (arteriovenous malformation) of colon   . CAD (coronary artery disease)    a. 12/2011 NSTEMI/Cath/PCI LCX (2.25x14 Resolute DES) & D1 (2.25x22 Resolute DES);  b. 01/2012 Cath/PCI: LM 30, LAD 30p, 40-19m D1 stent ok, 99 in sm branch of diag, LCX patent stent, OM1 20, RCA 95 ost (4.0x12 Promus DES), EF 55%;  c. 04/2012 Lexi Cardiolite  EF 48%, small area of scar @ base/mid inflat wall with mild peri-infarct ischemia.; CABG 12/4  . Carotid artery disease ([place]    a. 682-99%LICA, 93/7169  . Chronic bronchitis ([place]   . Chronic diastolic CHF (congestive  heart failure) ([place]    a. 02/2012 Echo EF 60-65%, nl wall motion, Gr 1 DD, mod MR  . Colon cancer ([place] 1992  . Esophageal stricture   . ESRD on hemodialysis ([place]    ESRD due to HTN, [facility] 2011 [facility]  Access is LUA AVF as of Sept 2014.   .Marland KitchenGERD (gastroesophageal reflux disease)   . High cholesterol 12/2011  . History of blood transfusion 07/2011; 12/2011; 01/2012 X 2; 04/2012  . History of gout   . History of lower GI bleeding   . Hypertension   . Iron deficiency anemia   . Mitral regurgitation    a. Moderate by echo, 02/2012  . Myocardial infarction   . Ovarian cancer ([place] 1992  . Pneumonia ~ 2009  . PUD (peptic ulcer disease)   . TIA (transient ischemic attack)     Patient Active Problem List   Diagnosis Date Noted  . Hypertensive emergency 07/08/2016  . Respiratory failure ([place]   . Cardiac arrest ([place]   . Palliative care encounter   . DNR (do not resuscitate) discussion   . Goals of care, counseling/discussion   . Hypertensive crisis without congestive heart failure 05/09/2016  . Acute pulmonary edema (HE[place] 04/06/2016  . Acute respiratory failure ([place] 04/06/2016  . Hypertensive crisis 01/27/2016  . History of colon cancer 01/27/2016  . History of ovarian cancer  01/27/2016  . Hypertensive urgency 01/27/2016  . Narrow complex tachycardia ([place]) 09/08/2015  . SVT (supraventricular tachycardia) ([place]) 09/08/2015  . Influenza A 08/30/2015  . Acute on chronic diastolic CHF (congestive heart failure) ([place]) 05/04/2015  . Unstable angina ([place]) 05/03/2015  . Essential hypertension   . Pain in joint, lower leg 08/14/2014  . Chest pain 11/26/2013  . Small bowel obstruction, partial 05/29/2013  . Chronic diastolic CHF (congestive heart failure) ([place]) 03/22/2013  . GI bleeding 03/21/2013  . Acute blood loss anemia 03/21/2013  . Vaginal odor 03/12/2013  . Vaginal discharge 03/12/2013  . Occlusion and stenosis of  carotid artery without mention of cerebral infarction 01/24/2013  . Hx of CABG 07/05/2012  . Carotid artery disease ([name]) 07/05/2012  . Anemia of chronic kidney failure 07/03/2012  . Secondary hyperparathyroidism ([place]) 07/03/2012  . Mitral regurgitation 06/12/2012  . Pneumonia 06/09/2012  . Non-STEMI (non-ST elevated myocardial infarction) ([place]) 06/08/2012  . GERD (gastroesophageal reflux disease) 01/09/2012  . HLD (hyperlipidemia) 01/05/2012  . Coronary atherosclerosis of native coronary artery 12/16/2011  . ESRD on hemodialysis ([place]) 12/16/2011    Past Surgical History:  Procedure Laterality Date  . ABDOMINAL HYSTERECTOMY  1992  . APPENDECTOMY  06/1990  . AV FISTULA PLACEMENT  07/2009   left upper arm  . COLON RESECTION  1992  . CORONARY ANGIOPLASTY WITH STENT PLACEMENT  12/15/11   "2"  . CORONARY ANGIOPLASTY WITH STENT PLACEMENT  y/2013   "1; makes total of 3" (05/02/2012)  . CORONARY ARTERY BYPASS GRAFT  06/13/2012   Procedure: CORONARY ARTERY BYPASS GRAFTING (CABG);  Surgeon: Grace Isaac, MD;  Location: [facility];  Service: Open Heart Surgery;  Laterality: N/A;  cabg x four;  using left internal mammary artery, and left leg greater saphenous vein harvested endoscopically  . DILATION AND CURETTAGE OF UTERUS    . ESOPHAGOGASTRODUODENOSCOPY  01/20/2012   Procedure: ESOPHAGOGASTRODUODENOSCOPY (EGD);  Surgeon: Ladene Artist, MD,FACG;  Location: [facility] ENDOSCOPY;  Service: Endoscopy;  Laterality: N/A;  . ESOPHAGOGASTRODUODENOSCOPY N/A 03/26/2013   Procedure: ESOPHAGOGASTRODUODENOSCOPY (EGD);  Surgeon: Irene Shipper, MD;  Location: [facility] ENDOSCOPY;  Service: Endoscopy;  Laterality: N/A;  . ESOPHAGOGASTRODUODENOSCOPY N/A 04/30/2015   Procedure: ESOPHAGOGASTRODUODENOSCOPY (EGD);  Surgeon: Rogene Houston, MD;  [facility]O [facility];  Service: Endoscopy;  Laterality: N/A;  1pm - moved to 10/20 @ 1:10  . INTRAOPERATIVE TRANSESOPHAGEAL ECHOCARDIOGRAM  06/13/2012   Procedure: INTRAOPERATIVE  TRANSESOPHAGEAL ECHOCARDIOGRAM;  Surgeon: Grace Isaac, MD;  Location: [facility];  Service: Open Heart Surgery;  Laterality: N/A;  . IR GENERIC HISTORICAL  07/26/2016   IR FLUORO GUIDE CV LINE RIGHT 07/26/2016 Greggory Keen, MD [facility]-INTERV RAD  . IR GENERIC HISTORICAL  07/26/2016   IR US GUIDE VASC ACCESS RIGHT 07/26/2016 Greggory Keen, MD [facility]  . LEFT HEART CATHETERIZATION WITH CORONARY ANGIOGRAM N/A 12/15/2011   Procedure: LEFT HEART CATHETERIZATION WITH CORONARY ANGIOGRAM;  Surgeon: Burnell Blanks, MD;  Location: [facility] CATH LAB;  Service: Cardiovascular;  Laterality: N/A;  . LEFT HEART CATHETERIZATION WITH CORONARY ANGIOGRAM N/A 01/10/2012   Procedure: LEFT HEART CATHETERIZATION WITH CORONARY ANGIOGRAM;  Surgeon: Peter M Martinique, MD;  Location: [facility] CATH LAB;  Service: Cardiovascular;  Laterality: N/A;  . LEFT HEART CATHETERIZATION WITH CORONARY ANGIOGRAM N/A 06/08/2012   Procedure: LEFT HEART CATHETERIZATION WITH CORONARY ANGIOGRAM;  Surgeon: Burnell Blanks, MD;  Location: [facility] CATH LAB;  Service: Cardiovascular;  Laterality: N/A;  . LEFT HEART CATHETERIZATION WITH CORONARY/GRAFT ANGIOGRAM N/A 12/10/2013   Procedure: LEFT  HEART CATHETERIZATION WITH Beatrix Fetters;  Surgeon: [name], MD;  Location: [facility] CATH LAB;  Service: Cardiovascular;  Laterality: N/A;  . OVARY SURGERY     ovarian cancer  . SHUNTOGRAM N/A 10/15/2013   Procedure: Fistulogram;  Surgeon: Serafina Mitchell, MD;  Location: [facility] CATH LAB;  Service: Cardiovascular;  Laterality: N/A;  . THROMBECTOMY / ARTERIOVENOUS GRAFT REVISION  2011   left upper arm  . TUBAL LIGATION  1980's    OB History    Gravida Para Term Preterm AB Living   2 2   2   2    SAB TAB Ectopic Multiple Live Births                   Home Medications    Prior to Admission medications   Medication Sig Start Date End Date Taking? Authorizing Provider  ALPRAZolam (XANAX) 0.25 MG tablet Take 0.25-0.5 mg by mouth at bedtime as needed  for sleep.  11/19/15   Historical Provider, MD  amiodarone (PACERONE) 200 MG tablet Take 1 tablet (200 mg total) by mouth daily. 09/23/15   Herminio Commons, MD  amLODipine (NORVASC) 2.5 MG tablet Take 2.5 mg by mouth daily.    Historical Provider, MD  aspirin EC 81 MG tablet Take 81 mg by mouth daily.     Historical Provider, MD  cloNIDine (CATAPRES) 0.2 MG tablet Take 1 tablet (0.2 mg total) by mouth 3 (three) times daily. 07/10/16   Thurnell Lose, MD  epoetin alfa (EPOGEN,PROCRIT) 37342 UNIT/ML injection Inject 1 mL (10,000 Units total) into the vein every Monday, Wednesday, and Friday with hemodialysis. 04/11/16   Orvan Falconer, MD  fluticasone (FLONASE) 50 MCG/ACT nasal spray Place 2 sprays into the nose daily as needed for allergies.     Historical Provider, MD  hydrALAZINE (APRESOLINE) 50 MG tablet Take 1 tablet (50 mg total) by mouth every 8 (eight) hours. 07/10/16   Thurnell Lose, MD  isosorbide mononitrate (IMDUR) 60 MG 24 hr tablet Take 1 tablet (60 mg total) by mouth daily. 07/10/16   Thurnell Lose, MD  lanthanum (FOSRENOL) 1000 MG chewable tablet Chew 1 tablet (1,000 mg total) by mouth 3 (three) times daily with meals. 04/08/16   Orvan Falconer, MD  multivitamin (RENA-VIT) TABS tablet Take 1 tablet by mouth daily.    Historical Provider, MD  nitroGLYCERIN (NITROSTAT) 0.4 MG SL tablet Place 1 tablet (0.4 mg total) under the tongue every 5 (five) minutes as needed for chest pain. 04/13/16 07/26/16  Herminio Commons, MD  omeprazole (PRILOSEC) 20 MG capsule TAKE ONE CAPSULE BY MOUTH ONCE DAILY 04/15/16   Butch Penny, NP  PROAIR HFA 108 (90 BASE) MCG/ACT inhaler Inhale 1 puff into the lungs every 6 (six) hours as needed for wheezing or shortness of breath.  01/09/15   Historical Provider, MD  SENSIPAR 30 MG tablet Take 30 mg by mouth daily with supper.  12/21/12   Historical Provider, MD  simvastatin (ZOCOR) 20 MG tablet Take 1 tablet (20 mg total) by mouth at bedtime. 04/04/16   Lendon Colonel, NP    Family History Family History  Problem Relation Age of Onset  . Heart disease Mother     Heart Disease before age 27  . Hyperlipidemia Mother   . Hypertension Mother   . Diabetes Mother   . Heart attack Mother   . Heart disease Father     Heart Disease before age 36  . Hyperlipidemia Father   .  Hypertension Father   . Diabetes Father   . Diabetes Sister   . Hypertension Sister   . Diabetes Brother   . Hyperlipidemia Brother   . Heart attack Brother   . Hypertension Sister   . Heart attack Brother   . Other      noncontributory for early CAD  . Colon cancer Neg Hx   . Esophageal cancer Neg Hx   . Liver disease Neg Hx   . Kidney disease Neg Hx   . Colon polyps Neg Hx     Social History Social History  Substance Use Topics  . Smoking status: Never Smoker  . Smokeless tobacco: Never Used  . Alcohol use No     Allergies   Aspirin; Contrast media [iodinated diagnostic agents]; Iron; Penicillins; Plavix [clopidogrel bisulfate]; Amlodipine; Bactrim [sulfamethoxazole-trimethoprim]; Dexilant [dexlansoprazole]; Morphine and related; Nitrofurantoin; Prilosec [omeprazole]; Venofer [ferric oxide]; Tylenol [acetaminophen]; Levaquin [levofloxacin in d5w]; and Protonix [pantoprazole sodium]   Review of Systems Review of Systems  Constitutional: Negative for fever.  Respiratory: Positive for shortness of breath.   Cardiovascular: Positive for chest pain.  Gastrointestinal: Negative for abdominal pain, blood in stool, nausea and vomiting.  Neurological: Positive for headaches. Negative for weakness and numbness.  All other systems reviewed and are negative.    Physical Exam Updated Vital Signs BP 152/73   Pulse 78   Temp 98.3 F (36.8 C) (Oral)   Resp 22   Ht 5' 2"  (1.575 m)   Wt 127 lb (57.6 kg)   SpO2 98%   BMI 23.23 kg/m   Physical Exam  Constitutional: She is oriented to person, place, and time. She appears well-developed and well-nourished. No  distress.  Chronically ill-appearing, no acute distress  HENT:  Head: Normocephalic and atraumatic.  Eyes: EOM are normal. Pupils are equal, round, and reactive to light.  Neck: Normal range of motion. Neck supple.  Right IJ catheter in place, no significant surrounding erythema  Cardiovascular: Normal rate, regular rhythm and normal heart sounds.   No murmur heard. Pulmonary/Chest: Effort normal. No respiratory distress. She has no wheezes.  Diminished breath sounds bilateral lower lobes  Abdominal: Soft. Bowel sounds are normal. There is no tenderness. There is no guarding.  Neurological: She is alert and oriented to person, place, and time.  5 out of 5 strength in all 4 extremities, no dysmetria to finger-nose-finger, cranial nerves II through XII intact  Skin: Skin is warm and dry.  Psychiatric: She has a normal mood and affect.  Nursing note and vitals reviewed.    ED Treatments / Results  Labs (all labs ordered are listed, but only abnormal results are displayed) Labs Reviewed  CBC WITH DIFFERENTIAL/PLATELET - Abnormal; Notable for the following:       Result Value   WBC 11.7 (*)    RBC 2.70 (*)    Hemoglobin 8.6 (*)    HCT 27.2 (*)    MCV 100.7 (*)    RDW 18.2 (*)    Neutro Abs 10.2 (*)    Lymphs Abs 0.5 (*)    All other components within normal limits  BASIC METABOLIC PANEL - Abnormal; Notable for the following:    Sodium 134 (*)    Chloride 92 (*)    Glucose, Bld 147 (*)    BUN 47 (*)    Creatinine, Ser 7.99 (*)    Calcium 8.8 (*)    GFR calc non Af Amer 4 (*)    GFR calc Af Amer 5 (*)  Anion gap 16 (*)    All other components within normal limits  TROPONIN I - Abnormal; Notable for the following:    Troponin I 1.52 (*)    All other components within normal limits  HEMOGLOBIN AND HEMATOCRIT, BLOOD - Abnormal; Notable for the following:    Hemoglobin 9.2 (*)    HCT 28.5 (*)    All other components within normal limits  POC OCCULT BLOOD, ED    EKG   EKG Interpretation  Date/Time:  Wednesday July 27 2016 03:20:44 EST Ventricular Rate:  76 PR Interval:    QRS Duration: 99 QT Interval:  448 QTC Calculation: 504 R Axis:   0 Text Interpretation:  Sinus rhythm LVH with secondary repolarization abnormality Anterior ST elevation, probably due to LVH Prolonged QT interval Baseline wander in lead(s) II III aVF No significant change since last tracing Confirmed by Dina Rich  MD, Yehia Mcbain ([id number]) on 07/27/2016 3:22:57 AM       Radiology Dg Chest 2 View  Result Date: 07/27/2016 CLINICAL DATA:  76 y/o  F; headache and chills. EXAM: CHEST  2 VIEW COMPARISON:  07/16/2016 chest radiograph. FINDINGS: Stable cardiac silhouette within normal limits. Right central venous catheter tip projects over the right atrium. Post median sternotomy with wires and alignment. Aortic atherosclerosis with calcification. No acute osseous abnormality is evident. Small left pleural effusion is slightly increased. Pulmonary venous hypertension. No focal consolidation. Minor left basilar atelectasis. IMPRESSION: Small left pleural effusion and minor left basilar atelectasis. Pulmonary venous hypertension. No focal consolidation. Electronically Signed   By: Kristine Garbe M.D.   On: 07/27/2016 04:38   Ct Head Wo Contrast  Result Date: 07/27/2016 CLINICAL DATA:  Headache. EXAM: CT HEAD WITHOUT CONTRAST TECHNIQUE: Contiguous axial images were obtained from the base of the skull through the vertex without intravenous contrast. COMPARISON:  CT 07/08/2016 FINDINGS: Brain: No evidence of acute infarction, hemorrhage, hydrocephalus, extra-axial collection or mass lesion/mass effect. Stable atrophy and chronic small vessel ischemia. Chronic lacunar infarct in the left external capsule. Vascular: Atherosclerosis of skullbase vasculature without hyperdense vessel or abnormal calcification. Skull: Normal. Negative for fracture or focal lesion. Sinuses/Orbits: Minimal mucosal  thickening in left side of sphenoid sinus. Remaining sinuses are clear. Mastoid air cells are well-aerated. Other: None. IMPRESSION: No acute intracranial abnormality. Stable atrophy and chronic small vessel ischemia. Electronically Signed   By: Jeb Levering M.D.   On: 07/27/2016 04:40   Ir Fluoro Guide Cv Line Right  Result Date: 07/26/2016 INDICATION: End-stage renal disease, clotted AV fistula, hyperkalemia EXAM: NON TUNNELED CENTRAL VENOUS HEMODIALYSIS CATHETER WITH ULTRASOUND FLUOROSCOPIC GUIDANCE MEDICATIONS: 1% lidocaine locally ANESTHESIA/SEDATION: Moderate (conscious) sedation was employed during this procedure. A total of Versed 0.5 mg was administered intravenously. Moderate Sedation Time: 15 minutes. The patient's level of consciousness and vital signs were monitored continuously by radiology nursing throughout the procedure under my direct supervision. FLUOROSCOPY TIME:  Fluoroscopy Time: 0 minutes 24 seconds (1 mGy). COMPLICATIONS: None immediate. PROCEDURE: Informed written consent was obtained from the patient after a discussion of the risks, benefits, and alternatives to treatment. Questions regarding the procedure were encouraged and answered. The right neck and chest were prepped with chlorhexidine in a sterile fashion, and a sterile drape was applied covering the operative field. Maximum barrier sterile technique with sterile gowns and gloves were used for the procedure. A timeout was performed prior to the initiation of the procedure. After creating a small venotomy incision, a micropuncture kit was utilized to access the right internal jugular  vein under direct, real-time ultrasound guidance after the overlying soft tissues were anesthetized with 1% lidocaine with epinephrine. Ultrasound image documentation was performed. Micro guidewire advanced. Four [place] dilator inserted. Amplatz guidewire advanced into the IVC. Tract dilatation performed to insert a 20 cm temporary dialysis  catheter. Tip position at the SVC RA junction. Catheter secured with a Prolene suture and a sterile dressing. Blood aspirated easily followed by saline and heparin flushes. External caps applied. Patient tolerated the procedure well. No immediate complication. IMPRESSION: Successful ultrasound and fluoroscopic insertion of a 20 cm temporary right IJ dialysis catheter. Tip SVC RA junction. Ready for use. Electronically Signed   By: Jerilynn Mages.  Shick M.D.   On: 07/26/2016 15:28   Ir US Guide Vasc Access Right  Result Date: 07/26/2016 INDICATION: End-stage renal disease, clotted AV fistula, hyperkalemia EXAM: NON TUNNELED CENTRAL VENOUS HEMODIALYSIS CATHETER WITH ULTRASOUND FLUOROSCOPIC GUIDANCE MEDICATIONS: 1% lidocaine locally ANESTHESIA/SEDATION: Moderate (conscious) sedation was employed during this procedure. A total of Versed 0.5 mg was administered intravenously. Moderate Sedation Time: 15 minutes. The patient's level of consciousness and vital signs were monitored continuously by radiology nursing throughout the procedure under my direct supervision. FLUOROSCOPY TIME:  Fluoroscopy Time: 0 minutes 24 seconds (1 mGy). COMPLICATIONS: None immediate. PROCEDURE: Informed written consent was obtained from the patient after a discussion of the risks, benefits, and alternatives to treatment. Questions regarding the procedure were encouraged and answered. The right neck and chest were prepped with chlorhexidine in a sterile fashion, and a sterile drape was applied covering the operative field. Maximum barrier sterile technique with sterile gowns and gloves were used for the procedure. A timeout was performed prior to the initiation of the procedure. After creating a small venotomy incision, a micropuncture kit was utilized to access the right internal jugular vein under direct, real-time ultrasound guidance after the overlying soft tissues were anesthetized with 1% lidocaine with epinephrine. Ultrasound image documentation  was performed. Micro guidewire advanced. Four [place] dilator inserted. Amplatz guidewire advanced into the IVC. Tract dilatation performed to insert a 20 cm temporary dialysis catheter. Tip position at the SVC RA junction. Catheter secured with a Prolene suture and a sterile dressing. Blood aspirated easily followed by saline and heparin flushes. External caps applied. Patient tolerated the procedure well. No immediate complication. IMPRESSION: Successful ultrasound and fluoroscopic insertion of a 20 cm temporary right IJ dialysis catheter. Tip SVC RA junction. Ready for use. Electronically Signed   By: Jerilynn Mages.  Shick M.D.   On: 07/26/2016 15:28    Procedures Procedures (including critical care time)  CRITICAL CARE Performed by: Merryl Hacker   Total critical care time: 30 minutes  Critical care time was exclusive of separately billable procedures and treating other patients.  Critical care was necessary to treat or prevent imminent or life-threatening deterioration.  Critical care was time spent personally by me on the following activities: development of treatment plan with patient and/or surrogate as well as nursing, discussions with consultants, evaluation of patient's response to treatment, examination of patient, obtaining history from patient or surrogate, ordering and performing treatments and interventions, ordering and review of laboratory studies, ordering and review of radiographic studies, pulse oximetry and re-evaluation of patient's condition.   Medications Ordered in ED Medications  acetaminophen (TYLENOL) tablet 1,000 mg (1,000 mg Oral Refused 07/27/16 0350)  nitroGLYCERIN 50 mg in dextrose 5 % 250 mL (0.2 mg/mL) infusion (5 mcg/min Intravenous New Bag/Given 07/27/16 0606)  heparin ADULT infusion 100 units/mL (25000 units/2107m sodium chloride 0.45%) (not administered)  aspirin tablet 325 mg (325 mg Oral Given 07/27/16 0528)     Initial Impression / Assessment and Plan / ED  Course  I have reviewed the triage vital signs and the nursing notes.  Pertinent labs & imaging results that were available during my care of the patient were reviewed by me and considered in my medical decision making (see chart for details).  Clinical Course    Patient presents with headache and chest pain. This is in the setting of hypertension at home. She has improved and her initial blood pressures here 121/86.  She's not having any active chest pain at this time. EKG does show anterior elevations but these are unchanged from prior. Note reciprocal changes and otherwise unchanged. Patient does not want anything for headache as her headache now is one out of 10. Basic labwork obtained. Hemoglobin noted to drop 3 g since yesterday. Repeat hemoglobin ordered. Patient denies any rectal bleeding.  Hemoccult ordered. Heparin drip held at this time.  Discussed with Dr. Raiford Simmonds, cardiology. Will initiate transfer to [facility] for cardiology evaluation.  His cast with Dr. Darrick Meigs. Plan for admission to stepdown unit at [facility]. But request placed. Daytime hospitalist to place orders.  Patient remains full code.  6:18 AM Occult positive. Will hold heparin at this time.  Final Clinical Impressions(s) / ED Diagnoses   Final diagnoses:  Hypertensive emergency  NSTEMI (non-ST elevated myocardial infarction) ([place])  Nonintractable headache, unspecified chronicity pattern, unspecified headache type  End stage renal disease ([place])    New Prescriptions New Prescriptions   No medications on file     Merryl Hacker, MD 07/27/16 [phone]

## 2016-07-27 NOTE — ED Notes (Signed)
Pt is alert and oriented and visiting with her husband.  IV in right upper arm infiltrated.  Removed this IV after stopping infusion.  Started IV in right wrist (22g) and continued protonix drip on this IV.  IV would not advance but flushed well and protonix drip running via this site without pain or difficulty.  Ultrasound IV access to be placed by second RN as this IV is not able to be advanced fully

## 2016-07-27 NOTE — H&P (Signed)
History and Physical    Shawna [name] SEG:[id number] DOB: 1940/10/18 DOA: 07/27/2016  PCP: [name], MD  Patient coming from: Home.    Chief Complaint:   Chest pain and severe HTN.   HPI: Shawna [name] is an 76 y.o. female with hx of ESRD on HD, clotted AVF on her left upper arm, s/p tunnel catheter placement yesterday, receiving HD urgently yesterday for hyperkalemia, hx of severe HTN, hx of GI bleed, AVM, Carotid disease, HLD, chronic diastolic CHF, presented to the ER as she was found to be severely HTN at home with SBP 250, and retrosternal Chest pain.  She did not have any SOB.  She took sequentially 2 clonidines, and upon presentation in the ER, her BP has improved to 129/93 and asymptomatic.  Work up in the ER showed CXR clear, troponin of 1.5, and EKG showed non specific ST depressions over the precordial leads, essentially unchanged compared to previous EKG.  Her Hb was found to be 8.6 grams per dL, repeated 9. 6 g/dL, a 2 grams drop from 1 day prior.  Her OB stool was positive brown stool.   The tunnel catheter placement was reported uneventful with no blood loss.  She denied black or blood stool.  Her K was 4.3.  She was not started on IV Heparin given hx of GI bleed and Hb drop.  She was given IV NTG as her BP started to go up, but discontinued by me as her BP drop again.  EDP spoke with cardiology who recommended that she be transferred laterally for cardiology consultation in consideration if she requires a heart cath.    ED Course:  See above.  Rewiew of Systems:  Constitutional: Negative for malaise, fever and chills. No significant weight loss or weight gain Eyes: Negative for eye pain, redness and discharge, diplopia, visual changes, or flashes of light. ENMT: Negative for ear pain, hoarseness, nasal congestion, sinus pressure and sore throat. No headaches; tinnitus, drooling, or problem swallowing. Cardiovascular: Negative for palpitations, diaphoresis, dyspnea and peripheral  edema. ; No orthopnea, PND Respiratory: Negative for cough, hemoptysis, wheezing and stridor. No pleuritic chestpain. Gastrointestinal: Negative for diarrhea, constipation,  melena, blood in stool, hematemesis, jaundice and rectal bleeding.    Genitourinary: Negative for frequency, dysuria, incontinence,flank pain and hematuria; Musculoskeletal: Negative for back pain and neck pain. Negative for swelling and trauma.;  Skin: . Negative for pruritus, rash, abrasions, bruising and skin lesion.; ulcerations Neuro: Negative for headache, lightheadedness and neck stiffness. Negative for weakness, altered level of consciousness , altered mental status, extremity weakness, burning feet, involuntary movement, seizure and syncope.  Psych: negative for anxiety, depression, insomnia, tearfulness, panic attacks, hallucinations, paranoia, suicidal or homicidal ideation    Past Medical History:  Diagnosis Date  . Anxiety   . Arthritis   . AVM (arteriovenous malformation) of colon   . CAD (coronary artery disease)    a. 12/2011 NSTEMI/Cath/PCI LCX (2.25x14 Resolute DES) & D1 (2.25x22 Resolute DES);  b. 01/2012 Cath/PCI: LM 30, LAD 30p, 40-20m D1 stent ok, 99 in sm branch of diag, LCX patent stent, OM1 20, RCA 95 ost (4.0x12 Promus DES), EF 55%;  c. 04/2012 Lexi Cardiolite  EF 48%, small area of scar @ base/mid inflat wall with mild peri-infarct ischemia.; CABG 12/4  . [place]id [place]    a. 673-71%LICA, 90/6269  . [place]itis [place]   . Chronic diastolic CHF (congestive heart failure) ([place]    a. 02/2012 Echo EF 60-65%,  nl wall motion, Gr 1 DD, mod MR  . Colon cancer ([place]) 1992  . Esophageal stricture   . ESRD on hemodialysis ([place])    ESRD due to HTN, started dialysis 2011 and gets HD at [facility] with Dr Hinda Lenis on MWF schedule.  Access is LUA AVF as of Sept 2014.   Marland Kitchen GERD (gastroesophageal reflux disease)   . High cholesterol 12/2011  . History of blood transfusion 07/2011; 12/2011;  01/2012 X 2; 04/2012  . History of gout   . History of lower GI bleeding   . Hypertension   . Iron deficiency anemia   . Mitral regurgitation    a. Moderate by echo, 02/2012  . Myocardial infarction   . Ovarian cancer ([place]) 1992  . Pneumonia ~ 2009  . PUD (peptic ulcer disease)   . TIA (transient ischemic attack)      Past Surgical History:  Procedure Laterality Date  . ABDOMINAL HYSTERECTOMY  1992  . APPENDECTOMY  06/1990  . AV FISTULA PLACEMENT  07/2009   left upper arm  . COLON RESECTION  1992  . CORONARY ANGIOPLASTY WITH STENT PLACEMENT  12/15/11   "2"  . CORONARY ANGIOPLASTY WITH STENT PLACEMENT  y/2013   "1; makes total of 3" (05/02/2012)  . CORONARY ARTERY BYPASS GRAFT  06/13/2012   Procedure: CORONARY ARTERY BYPASS GRAFTING (CABG);  Surgeon: Grace Isaac, MD;  Location: [facility];  Service: Open Heart Surgery;  Laterality: N/A;  cabg x four;  using left internal mammary artery, and left leg greater saphenous vein harvested endoscopically  . DILATION AND CURETTAGE OF UTERUS    . ESOPHAGOGASTRODUODENOSCOPY  01/20/2012   Procedure: ESOPHAGOGASTRODUODENOSCOPY (EGD);  Surgeon: Ladene Artist, MD,FACG;  Location: [facility] ENDOSCOPY;  Service: Endoscopy;  Laterality: N/A;  . ESOPHAGOGASTRODUODENOSCOPY N/A 03/26/2013   Procedure: ESOPHAGOGASTRODUODENOSCOPY (EGD);  Surgeon: Irene Shipper, MD;  Location: [facility] ENDOSCOPY;  Service: Endoscopy;  Laterality: N/A;  . ESOPHAGOGASTRODUODENOSCOPY N/A 04/30/2015   Procedure: ESOPHAGOGASTRODUODENOSCOPY (EGD);  Surgeon: Rogene Houston, MD;  [facility];  Service: Endoscopy;  Laterality: N/A;  1pm - moved to 10/20 @ 1:10  . INTRAOPERATIVE TRANSESOPHAGEAL ECHOCARDIOGRAM  06/13/2012   Procedure: INTRAOPERATIVE TRANSESOPHAGEAL ECHOCARDIOGRAM;  Surgeon: Grace Isaac, MD;  Location: [facility];  Service: Open Heart Surgery;  Laterality: N/A;  . IR GENERIC HISTORICAL  07/26/2016   IR FLUORO GUIDE CV LINE RIGHT 07/26/2016 Greggory Keen, MD [facility]-INTER[facility] RAD    . IR GENERIC HISTORICAL  07/26/2016   IR US GUIDE VASC ACCESS RIGHT 07/26/2016 Greggory Keen, MD [facility]  . LEFT HEART CATHETERIZATION WITH CORONARY ANGIOGRAM N/A 12/15/2011   Procedure: LEFT HEART CATHETERIZATION WITH CORONARY ANGIOGRAM;  Surgeon: Burnell Blanks, MD;  Location: [facility] CATH LAB;  Service: Cardiovascular;  Laterality: N/A;  . LEFT HEART CATHETERIZATION WITH CORONARY ANGIOGRAM N/A 01/10/2012   Procedure: LEFT HEART CATHETERIZATION WITH CORONARY ANGIOGRAM;  Surgeon: Lashawnna Lambrecht M Martinique, MD;  Location: [facility] CATH LAB;  Service: Cardiovascular;  Laterality: N/A;  . LEFT HEART CATHETERIZATION WITH CORONARY ANGIOGRAM N/A 06/08/2012   Procedure: LEFT HEART CATHETERIZATION WITH CORONARY ANGIOGRAM;  Surgeon: Burnell Blanks, MD;  Location: [facility] CATH LAB;  Service: Cardiovascular;  Laterality: N/A;  . LEFT HEART CATHETERIZATION WITH CORONARY/GRAFT ANGIOGRAM N/A 12/10/2013   Procedure: LEFT HEART CATHETERIZATION WITH Beatrix Fetters;  Surgeon: [name], MD;  Location: [facility] CATH LAB;  Service: Cardiovascular;  Laterality: N/A;  . OVARY SURGERY     ovarian cancer  . SHUNTOGRAM N/A 10/15/2013  Procedure: Fistulogram;  Surgeon: Serafina Mitchell, MD;  Location: [facility] C[facility] LAB;  Service: Cardiovascular;  Laterality: N/A;  . THROMBECTOMY / ARTERIOVENOUS GRAFT REVISION  2011   left upper arm  . TUBAL LIGATION  1980's     reports that she has never smoked. She has never used smokeless tobacco. She reports that she does not drink alcohol or use drugs.  Allergies  Allergen Reactions  . Aspirin Other (See Comments)    Mess up her stomach; "makes my bowels have blood in them". Takes 81 mg EC Aspirin   . Contrast Media [Iodinated Diagnostic Agents] Itching  . Iron Itching and Other (See Comments)    "they gave me iron in dialysis; had to give me Benadryl cause I had to have the iron" (05/02/2012)  . Penicillins Other (See Comments)      . Plavix [Clopidogrel Bisulfate] Rash  .  Amlodipine Swelling and Other (See Comments)    Leg swelling  . Bactrim [Sulfamethoxazole-Trimethoprim] Rash  . Dexilant [Dexlansoprazole] Other (See Comments)    Upset stomach  . Morphine And Related Itching    Itching in feet  . Nitrofurantoin Hives  . Prilosec [Omeprazole] Other (See Comments)    "back spasms"  . Venofer [Ferric Oxide] Itching    Patient reports using Benadryl prior to doses as [facility]  . Tylenol [Acetaminophen] Other (See Comments)    Makes her feet on fire per pt  . Levaquin [Levofloxacin In D5w] Rash  . Protonix [Pantoprazole Sodium] Rash    Family History  Problem Relation Age of Onset  . Heart disease Mother     Heart Disease before age 71  . Hyperlipidemia Mother   . Hypertension Mother   . Diabetes Mother   . Heart attack Mother   . Heart disease Father     Heart Disease before age 24  . Hyperlipidemia Father   . Hypertension Father   . Diabetes Father   . Diabetes Sister   . Hypertension Sister   . Diabetes Brother   . Hyperlipidemia Brother   . Heart attack Brother   . Hypertension Sister   . Heart attack Brother   . Other      noncontributory for early CAD  . Colon cancer Neg Hx   . Esophageal cancer Neg Hx   . Liver disease Neg Hx   . Kidney disease Neg Hx   . Colon polyps Neg Hx      Prior to Admission medications   Medication Sig Start Date End Date Taking? Authorizing Provider  ALPRAZolam (XANAX) 0.25 MG tablet Take 0.25-0.5 mg by mouth at bedtime as needed for sleep.  11/19/15   Historical Provider, MD  amiodarone (PACERONE) 200 MG tablet Take 1 tablet (200 mg total) by mouth daily. 09/23/15   Herminio Commons, MD  amLODipine (NORVASC) 2.5 MG tablet Take 2.5 mg by mouth daily.    Historical Provider, MD  aspirin EC 81 MG tablet Take 81 mg by mouth daily.     Historical Provider, MD  cloNIDine (CATAPRES) 0.2 MG tablet Take 1 tablet (0.2 mg total) by mouth 3 (three) times daily. 07/10/16   Thurnell Lose, MD  epoetin  alfa (EPOGEN,PROCRIT) 36468 UNIT/ML injection Inject 1 mL (10,000 Units total) into the vein every Monday, Wednesday, and Friday with hemodialysis. 04/11/16   Orvan Falconer, MD  fluticasone (FLONASE) 50 MCG/ACT nasal spray Place 2 sprays into the nose daily as needed for allergies.     Historical Provider,  MD  hydrALAZINE (APRESOLINE) 50 MG tablet Take 1 tablet (50 mg total) by mouth every 8 (eight) hours. 07/10/16   Thurnell Lose, MD  isosorbide mononitrate (IMDUR) 60 MG 24 hr tablet Take 1 tablet (60 mg total) by mouth daily. 07/10/16   Thurnell Lose, MD  lanthanum (FOSRENOL) 1000 MG chewable tablet Chew 1 tablet (1,000 mg total) by mouth 3 (three) times daily with meals. 04/08/16   Orvan Falconer, MD  multivitamin (RENA-VIT) TABS tablet Take 1 tablet by mouth daily.    Historical Provider, MD  nitroGLYCERIN (NITROSTAT) 0.4 MG SL tablet Place 1 tablet (0.4 mg total) under the tongue every 5 (five) minutes as needed for chest pain. 04/13/16 07/26/16  Herminio Commons, MD  omeprazole (PRILOSEC) 20 MG capsule TAKE ONE CAPSULE BY MOUTH ONCE DAILY 04/15/16   Butch Penny, NP  PROAIR HFA 108 (90 BASE) MCG/ACT inhaler Inhale 1 puff into the lungs every 6 (six) hours as needed for wheezing or shortness of breath.  01/09/15   Historical Provider, MD  SENSIPAR 30 MG tablet Take 30 mg by mouth daily with supper.  12/21/12   Historical Provider, MD  simvastatin (ZOCOR) 20 MG tablet Take 1 tablet (20 mg total) by mouth at bedtime. 04/04/16   Lendon Colonel, NP    Physical Exam: Vitals:   07/27/16 0700 07/27/16 0710 07/27/16 0740 07/27/16 0750  BP: 129/63 142/74 94/83 134/69  Pulse: 66 70    Resp: _0 Temp:      TempSrc:      SpO2: 96% 98%    Weight:      Height:          Constitutional: NAD, calm, comfortable Vitals:   07/27/16 0700 07/27/16 0710 07/27/16 0740 07/27/16 0750  BP: 129/63 142/74 94/83 134/69  Pulse: 66 70    Resp: _1 Temp:      TempSrc:      SpO2: 96% 98%      Weight:      Height:       Eyes: PERRL, lids and conjunctivae normal ENMT: Mucous membranes are moist. Posterior pharynx clear of any exudate or lesions.Normal dentition.  Neck: normal, supple, no masses, no thyromegaly.  Dialysis cath intact.  No bleeding.  Respiratory: clear to auscultation bilaterally, no wheezing, no crackles. Normal respiratory effort. No accessory muscle use.  Cardiovascular: Regular rate and rhythm, no murmurs / rubs / gallops. No extremity edema. 2+ pedal pulses. No carotid bruits.  Abdomen: no tenderness, no masses palpated. No hepatosplenomegaly. Bowel sounds positive.  Musculoskeletal: no clubbing / cyanosis. No joint deformity upper and lower extremities. Good ROM, no contractures. Normal muscle tone.  Skin: no rashes, lesions, ulcers. No induration Neurologic: CN 2-12 grossly intact. Sensation intact, DTR normal. Strength 5/5 in all 4.  Psychiatric: Normal judgment and insight. Alert and oriented x 3. Normal mood.    Labs on Admission: I have personally reviewed following labs and imaging studies CBC:  Recent Labs Lab 07/26/16 1050 07/26/16 1358 07/26/16 1847 07/27/16 0355 07/27/16 0546  WBC 10.9*  --   --  11.7*  --   NEUTROABS 10.4*  --   --  10.2*  --   HGB 10.4* 11.6*  11.6* 11.2* 8.6* 9.2*  HCT 33.0* 34.0*  34.0* 33.0* 27.2* 28.5*  MCV 100.3*  --   --  100.7*  --   PLT 366  --   --  303  --  Basic Metabolic Panel:  Recent Labs Lab 07/26/16 1221 07/26/16 1358 07/26/16 1539 07/26/16 1847 07/27/16 0355  NA  --  133*  133*  --  135 134*  K >7.5* 7.5*  7.5* 6.4* 2.4* 4.3  CL  --   --   --  95* 92*  CO2  --   --   --   --  26  GLUCOSE  --  137*  137*  --  99 147*  BUN  --   --   --  23* 47*  CREATININE  --   --   --  5.00* 7.99*  CALCIUM  --   --   --   --  8.8*   Coagulation Profile:  Recent Labs Lab 07/26/16 1050  INR 1.02   Cardiac Enzymes:  Recent Labs Lab 07/27/16 0355  TROPONINI 1.52*   Urine analysis:     Component Value Date/Time   COLORURINE YELLOW 12/16/2012 1919   APPEARANCEUR CLOUDY (A) 12/16/2012 1919   LABSPEC 1.009 12/16/2012 1919   PHURINE 7.5 12/16/2012 1919   GLUCOSEU NEGATIVE 12/16/2012 1919   HGBUR TRACE (A) 12/16/2012 1919   BILIRUBINUR NEGATIVE 12/16/2012 1919   KETONESUR NEGATIVE 12/16/2012 1919   PROTEINUR 100 (A) 12/16/2012 1919   UROBILINOGEN 0.2 12/16/2012 1919   NITRITE NEGATIVE 12/16/2012 1919   LEUKOCYTESUR SMALL (A) 12/16/2012 1919    Radiological Exams on Admission: Dg Chest 2 View  Result Date: 07/27/2016 CLINICAL DATA:  76 y/o  F; headache and chills. EXAM: CHEST  2 VIEW COMPARISON:  07/16/2016 chest radiograph. FINDINGS: Stable cardiac silhouette within normal limits. Right central venous catheter tip projects over the right atrium. Post median sternotomy with wires and alignment. Aortic atherosclerosis with calcification. No acute osseous abnormality is evident. Small left pleural effusion is slightly increased. Pulmonary venous hypertension. No focal consolidation. Minor left basilar atelectasis. IMPRESSION: Small left pleural effusion and minor left basilar atelectasis. Pulmonary venous hypertension. No focal consolidation. Electronically Signed   By: Kristine Garbe M.D.   On: 07/27/2016 04:38   Ct Head Wo Contrast  Result Date: 07/27/2016 CLINICAL DATA:  Headache. EXAM: CT HEAD WITHOUT CONTRAST TECHNIQUE: Contiguous axial images were obtained from the base of the skull through the vertex without intravenous contrast. COMPARISON:  CT 07/08/2016 FINDINGS: Brain: No evidence of acute infarction, hemorrhage, hydrocephalus, extra-axial collection or mass lesion/mass effect. Stable atrophy and chronic small vessel ischemia. Chronic lacunar infarct in the left external capsule. Vascular: Atherosclerosis of skullbase vasculature without hyperdense vessel or abnormal calcification. Skull: Normal. Negative for fracture or focal lesion. Sinuses/Orbits: Minimal  mucosal thickening in left side of sphenoid sinus. Remaining sinuses are clear. Mastoid air cells are well-aerated. Other: None. IMPRESSION: No acute intracranial abnormality. Stable atrophy and chronic small vessel ischemia. Electronically Signed   By: Jeb Levering M.D.   On: 07/27/2016 04:40   Ir Fluoro Guide Cv Line Right  Result Date: 07/26/2016 INDICATION: End-stage renal disease, clotted AV fistula, hyperkalemia EXAM: NON TUNNELED CENTRAL VENOUS HEMODIALYSIS CATHETER WITH ULTRASOUND FLUOROSCOPIC GUIDANCE MEDICATIONS: 1% lidocaine locally ANESTHESIA/SEDATION: Moderate (conscious) sedation was employed during this procedure. A total of Versed 0.5 mg was administered intravenously. Moderate Sedation Time: 15 minutes. The patient's level of consciousness and vital signs were monitored continuously by radiology nursing throughout the procedure under my direct supervision. FLUOROSCOPY TIME:  Fluoroscopy Time: 0 minutes 24 seconds (1 mGy). COMPLICATIONS: None immediate. PROCEDURE: Informed written consent was obtained from the patient after a discussion of the risks, benefits, and alternatives to  treatment. Questions regarding the procedure were encouraged and answered. The right neck and chest were prepped with chlorhexidine in a sterile fashion, and a sterile drape was applied covering the operative field. Maximum barrier sterile technique with sterile gowns and gloves were used for the procedure. A timeout was performed prior to the initiation of the procedure. After creating a small venotomy incision, a micropuncture kit was utilized to access the right internal jugular vein under direct, real-time ultrasound guidance after the overlying soft tissues were anesthetized with 1% lidocaine with epinephrine. Ultrasound image documentation was performed. Micro guidewire advanced. Four [place] dilator inserted. Amplatz guidewire advanced into the IVC. Tract dilatation performed to insert a 20 cm temporary  dialysis catheter. Tip position at the SVC RA junction. Catheter secured with a Prolene suture and a sterile dressing. Blood aspirated easily followed by saline and heparin flushes. External caps applied. Patient tolerated the procedure well. No immediate complication. IMPRESSION: Successful ultrasound and fluoroscopic insertion of a 20 cm temporary right IJ dialysis catheter. Tip SVC RA junction. Ready for use. Electronically Signed   By: Jerilynn Mages.  Shick M.D.   On: 07/26/2016 15:28   Ir US Guide Vasc Access Right  Result Date: 07/26/2016 INDICATION: End-stage renal disease, clotted AV fistula, hyperkalemia EXAM: NON TUNNELED CENTRAL VENOUS HEMODIALYSIS CATHETER WITH ULTRASOUND FLUOROSCOPIC GUIDANCE MEDICATIONS: 1% lidocaine locally ANESTHESIA/SEDATION: Moderate (conscious) sedation was employed during this procedure. A total of Versed 0.5 mg was administered intravenously. Moderate Sedation Time: 15 minutes. The patient's level of consciousness and vital signs were monitored continuously by radiology nursing throughout the procedure under my direct supervision. FLUOROSCOPY TIME:  Fluoroscopy Time: 0 minutes 24 seconds (1 mGy). COMPLICATIONS: None immediate. PROCEDURE: Informed written consent was obtained from the patient after a discussion of the risks, benefits, and alternatives to treatment. Questions regarding the procedure were encouraged and answered. The right neck and chest were prepped with chlorhexidine in a sterile fashion, and a sterile drape was applied covering the operative field. Maximum barrier sterile technique with sterile gowns and gloves were used for the procedure. A timeout was performed prior to the initiation of the procedure. After creating a small venotomy incision, a micropuncture kit was utilized to access the right internal jugular vein under direct, real-time ultrasound guidance after the overlying soft tissues were anesthetized with 1% lidocaine with epinephrine. Ultrasound image  documentation was performed. Micro guidewire advanced. Four [place] dilator inserted. Amplatz guidewire advanced into the IVC. Tract dilatation performed to insert a 20 cm temporary dialysis catheter. Tip position at the SVC RA junction. Catheter secured with a Prolene suture and a sterile dressing. Blood aspirated easily followed by saline and heparin flushes. External caps applied. Patient tolerated the procedure well. No immediate complication. IMPRESSION: Successful ultrasound and fluoroscopic insertion of a 20 cm temporary right IJ dialysis catheter. Tip SVC RA junction. Ready for use. Electronically Signed   By: Jerilynn Mages.  Shick M.D.   On: 07/26/2016 15:28    EKG: Independently reviewed.  Assessment/Plan Active Problems:   HLD (hyperlipidemia)   Mitral regurgitation   Anemia of chronic kidney failure   Acute blood loss anemia   Chronic diastolic CHF (congestive heart failure) (HCC)   Essential hypertension   Acute on chronic diastolic CHF (congestive heart failure) (HCC)   Hypertensive crisis   Hypertensive emergency   ACS (acute coronary syndrome) ([place])    PLAN:   ACS:  I suspect she has ACS.  WIll continue with statin, oral nitrate, and will tranfer to [facility] as planned for  cardiology consultation.  After discussing with patient and her husband, will hold off on IV heparin, as she has hx of GI Bleed, and did have OB positve stool with 2 grams Hb drop.  WIll continue with Omeprazole, as she has allergy to other PPI. Continue with cycling her troponins.  Cardiology has been consulted and will see her once she is at [facility].  HTN urgency:  BP is controlled now.  D/C IV NTG, and be careful not to expose her to hypotension.  ESRD on HD:  Please consult nephrology for routine dialysis support.  She is euvolemic and K is 4.3.   Chronic diastolic CHF:  Stable.    Anemia:  Not sure if blood loss from procedure, but doubtful.  Will give her IV Protonix drip and consider GI consultation.  Follow Hct Q  shift, type and screen.    DVT prophylaxis: SUBQ Heparin.  Code Status: FULL CODE (please note that this was discussed and confirmed this morning>) Family Communication: husband at bedside.  Disposition Plan: TBD.  Consults called: Cardiology.  Admission status: Inpatient.    Berel Najjar MD FACP. [facility]  If 7PM-7AM, please contact night-coverage [email or web address] Password [facility]  07/27/2016, 8:24 AM

## 2016-07-27 NOTE — ED Notes (Signed)
While putting pt on heart monitor, rate decreased to 70-80's range, unable to obtain any tracings,

## 2016-07-27 NOTE — ED Notes (Signed)
Please note that delay in labs is due to difficulty obtaining sample.

## 2016-07-27 NOTE — Consult Note (Addendum)
CARDIOLOGY CONSULT NOTE   Patient ID: Shawna [name] MRN: [id number], DOB/AGE: 1940/08/06   Admit date: 07/27/2016 Date of Consult: 07/27/2016   Primary Physician: [name], MD Primary Cardiologist: Dr. Bronson Ing  Pt. Profile  Shawna [name] is a 76 year old female with past medical history of CAD with previous stents and CABG in 2013 presented with chest pain and hypertensive urgency.   Problem List  Past Medical History:  Diagnosis Date  . Anxiety   . Arthritis   . AVM (arteriovenous malformation) of colon   . CAD (coronary artery disease)    a. 12/2011 NSTEMI/Cath/PCI LCX (2.25x14 Resolute DES) & D1 (2.25x22 Resolute DES);  b. 01/2012 Cath/PCI: LM 30, LAD 30p, 40-20m D1 stent ok, 99 in sm branch of diag, LCX patent stent, OM1 20, RCA 95 ost (4.0x12 Promus DES), EF 55%;  c. 04/2012 Lexi Cardiolite  EF 48%, small area of scar @ base/mid inflat wall with mild peri-infarct ischemia.; CABG 12/4  . Carotid artery disease ([place]    a. 699-37%LICA, 91/6967  . Chronic bronchitis ([place]   . Chronic diastolic CHF (congestive heart failure) ([place]    a. 02/2012 Echo EF 60-65%, nl wall motion, Gr 1 DD, mod MR  . Colon cancer ([place] 1992  . Esophageal stricture   . ESRD on hemodialysis ([place]    ESRD due to HTN, started dialysis 2011 [facility]  Access is LUA AVF as of Sept 2014.   .Marland KitchenGERD (gastroesophageal reflux disease)   . High cholesterol 12/2011  . History of blood transfusion 07/2011; 12/2011; 01/2012 X 2; 04/2012  . History of gout   . History of lower GI bleeding   . Hypertension   . Iron deficiency anemia   . Mitral regurgitation    a. Moderate by echo, 02/2012  . Myocardial infarction   . Ovarian cancer ([place] 1992  . Pneumonia ~ 2009  . PUD (peptic ulcer disease)   . TIA (transient ischemic attack)     Past Surgical History:  Procedure Laterality Date  . ABDOMINAL HYSTERECTOMY  1992  . APPENDECTOMY  06/1990  . AV FISTULA  PLACEMENT  07/2009   left upper arm  . COLON RESECTION  1992  . CORONARY ANGIOPLASTY WITH STENT PLACEMENT  12/15/11   "2"  . CORONARY ANGIOPLASTY WITH STENT PLACEMENT  y/2013   "1; makes total of 3" (05/02/2012)  . CORONARY ARTERY BYPASS GRAFT  06/13/2012   Procedure: CORONARY ARTERY BYPASS GRAFTING (CABG);  Surgeon: EGrace Isaac MD;  [place]  Service: Open Heart Surgery;  Laterality: N/A;  cabg x four;  using left internal mammary artery, and left leg greater saphenous vein harvested endoscopically  . DILATION AND CURETTAGE OF UTERUS    . ESOPHAGOGASTRODUODENOSCOPY  01/20/2012   Procedure: ESOPHAGOGASTRODUODENOSCOPY (EGD);  Surgeon: MLadene Artist MD,FACG;  [facility];  Service: Endoscopy;  Laterality: N/A;  . ESOPHAGOGASTRODUODENOSCOPY N/A 03/26/2013   Procedure: ESOPHAGOGASTRODUODENOSCOPY (EGD);  Surgeon: JIrene Shipper MD;  [facility];  Service: Endoscopy;  Laterality: N/A;  . ESOPHAGOGASTRODUODENOSCOPY N/A 04/30/2015   Procedure: ESOPHAGOGASTRODUODENOSCOPY (EGD);  Surgeon: NRogene Houston MD;  [facility];  Service: Endoscopy;  Laterality: N/A;  1pm - moved to 10/20 @ 1:10  . INTRAOPERATIVE TRANSESOPHAGEAL ECHOCARDIOGRAM  06/13/2012   Procedure: INTRAOPERATIVE TRANSESOPHAGEAL ECHOCARDIOGRAM;  Surgeon: EGrace Isaac MD;  [place]: [place]  Service: Open Heart Surgery;  Laterality: N/A;  .  IR GENERIC HISTORICAL  07/26/2016   IR FLUORO GUIDE CV LINE RIGHT 07/26/2016 Greggory Keen, MD [facility]  . IR GENERIC HISTORICAL  07/26/2016   IR US GUIDE VASC ACCESS RIGHT 07/26/2016 Greggory Keen, MD [facility]  . LEFT HEART CATHETERIZATION WITH CORONARY ANGIOGRAM N/A 12/15/2011   Procedure: LEFT HEART CATHETERIZATION WITH CORONARY ANGIOGRAM;  Surgeon: Burnell Blanks, MD;  [facility];  Service: Cardiovascular;  Laterality: N/A;  . LEFT HEART CATHETERIZATION WITH CORONARY ANGIOGRAM N/A 01/10/2012   Procedure: LEFT HEART  CATHETERIZATION WITH CORONARY ANGIOGRAM;  Surgeon: Peter M Martinique, MD;  [facility];  Service: Cardiovascular;  Laterality: N/A;  . LEFT HEART CATHETERIZATION WITH CORONARY ANGIOGRAM N/A 06/08/2012   Procedure: LEFT HEART CATHETERIZATION WITH CORONARY ANGIOGRAM;  Surgeon: Burnell Blanks, MD;  Location: [facility] LAB;  Service: Cardiovascular;  Laterality: N/A;  . LEFT HEART CATHETERIZATION WITH CORONARY/GRAFT ANGIOGRAM N/A 12/10/2013   Procedure: LEFT HEART CATHETERIZATION WITH Beatrix Fetters;  Surgeon: [name], MD;  Location: [facility] CATH LAB;  Service: Cardiovascular;  Laterality: N/A;  . OVARY SURGERY     ovarian cancer  . SHUNTOGRAM N/A 10/15/2013   Procedure: Fistulogram;  Surgeon: Serafina Mitchell, MD;  Location: [facility] CATH LAB;  Service: Cardiovascular;  Laterality: N/A;  . THROMBECTOMY / ARTERIOVENOUS GRAFT REVISION  2011   left upper arm  . TUBAL LIGATION  1980's     Allergies  Allergies  Allergen Reactions  . Aspirin Other (See Comments)    Mess up her stomach; "makes my bowels have blood in them". Takes 81 mg EC Aspirin   . Contrast Media [Iodinated Diagnostic Agents] Itching  . Iron Itching and Other (See Comments)    "they gave me iron in dialysis; had to give me Benadryl cause I had to have the iron" (05/02/2012)  . Penicillins Other (See Comments)      . Plavix [Clopidogrel Bisulfate] Rash  . Amlodipine Swelling and Other (See Comments)    Leg swelling  . Bactrim [Sulfamethoxazole-Trimethoprim] Rash  . Dexilant [Dexlansoprazole] Other (See Comments)    Upset stomach  . Morphine And Related Itching    Itching in feet  . Nitrofurantoin Hives  . Prilosec [Omeprazole] Other (See Comments)    "back spasms"  . Venofer [Ferric Oxide] Itching    Patient reports using Benadryl prior to doses as [facility]  . Tylenol [Acetaminophen] Other (See Comments)    Makes her feet on fire per pt  . Levaquin [Levofloxacin In D5w] Rash  . Protonix  [Pantoprazole Sodium] Rash    HPI   Shawna [name] is a 76 year old female with past medical history of CAD with previous stents and CABG in 2013. Last cardiac catheterization performed on 12/10/2013 showed a patent left main, focal disease in the mid to distal LAD, patent diagonal, patent LIMA to LAD, severe stenosis in proximal left circumflex, patent SVG to left circumflex, 70% proximal ramus stenosis, patent SVG to ramus, occluded stent to ostium of RCA and finely patent SVG to distal RCA. She had a normal Myoview on 08/25/2014 that showed EF 57%. 2-D echo obtained in 2015 showed EF 55-60% with moderate LVH and grade 1 diastolic dysfunction with mild to moderate MR. She also have history of labile malignant hypertension with alternating hypotension between dialysis and nondialysis days, ESRD on HD MWF, chronic diastolic heart failure, history of carotid artery stenosis with 69-62% LICA 03/5283, HLD, TIA, moderate MR and PUD. She could not tolerate amlodipine the past  due to peripheral swelling. She is also on amiodarone due to history of PSVT since March 2017. Repeat echocardiogram obtained on 09/09/2015 showed EF 10-93%, grade 2 diastolic dysfunction, PA peak pressure 45 mmHg.  Prior to this admission, in the last 6 month, patient had 5 hospital admissions and 3 ED visits most of which was related to hypertensive crisis sometimes with chest pain as well. In September 2017, she came in with chest pain or shortness breath. She was found to be in flash pulmonary edema with hypertensive urgency. She required intubation during the hospitalization. Blood pressure was eventually controlled on IV nitroglycerin and she was urgently dialyzed. Her hydralazine was not restarted, labetalol dose was adjusted along with clonidine as well. She returned in October with blood pressure of 230/130. She was also having some chest discomfort for at the time. Elevated troponin was felt to be demand ischemia in the setting of  uncontrolled hypertension. On 05/11/2016, she had a PEA arrest requiring CPR along with episodes of bradycardia. Afterward, she did require intubation as well. This occurred in the setting of hyperkalemia with potassium 6.4 on epi drip. Since then she does occasional chest pain with palpation.   Her left upper extremity AV fistula was recently clotted off. She was unable to care dialysis on Monday. She underwent temporary catheter placement on Monday. She also had urgent hemodialysis yesterday for hyperkalemia. She presented to [facility] earlier this morning with severely elevated high blood pressure with systolic blood pressure 235 and retrosternal chest pain. She took 2 clonidine, upon presentation to the ED, her BP improved to 129/93. Workup in the ED showed clear chest x-ray. Troponin 1.5. EKG was showing nonspecific changes. Hemoglobin was found to be 8.6, repeat hemoglobin was 9.6. Stool Hemoccult was positive. She says ever since her PE arrest and CPR, she has some soreness in the chest. However yesterday, she was just laying in bed when the chest pain started. Total duration of chest pain was roughly 5 minutes. It improved after she took a nitroglycerin. She denies any recent swelling or shortness breath. She has been having some chest discomfort with exertion lately as well.     Inpatient Medications  . acetaminophen  1,000 mg Oral Once  . amiodarone  200 mg Oral Daily  . amLODipine  2.5 mg Oral Daily  . aspirin EC  81 mg Oral Daily  . cinacalcet  30 mg Oral Q supper  . cloNIDine  0.2 mg Oral TID  . fluticasone  2 spray Each Nare Daily  . heparin  5,000 Units Subcutaneous Q8H  . hydrALAZINE  50 mg Oral Q8H  . isosorbide mononitrate  60 mg Oral Daily  . lanthanum  1,000 mg Oral TID WC  . multivitamin  1 tablet Oral Daily  . [START ON 07/30/2016] pantoprazole  40 mg Intravenous Q12H  . simvastatin  20 mg Oral QHS  . sodium chloride flush  3 mL Intravenous Q12H    Family  History Family History  Problem Relation Age of Onset  . Heart disease Mother     Heart Disease before age 43  . Hyperlipidemia Mother   . Hypertension Mother   . Diabetes Mother   . Heart attack Mother   . Heart disease Father     Heart Disease before age 62  . Hyperlipidemia Father   . Hypertension Father   . Diabetes Father   . Diabetes Sister   . Hypertension Sister   . Diabetes Brother   .  Hyperlipidemia Brother   . Heart attack Brother   . Hypertension Sister   . Heart attack Brother   . Other      noncontributory for early CAD  . Colon cancer Neg Hx   . Esophageal cancer Neg Hx   . Liver disease Neg Hx   . Kidney disease Neg Hx   . Colon polyps Neg Hx      Social History Social History   Social History  . Marital status: Married    Spouse name: N/A  . Number of children: N/A  . Years of education: N/A   Occupational History  . Not on file.   Social History Main Topics  . Smoking status: Never Smoker  . Smokeless tobacco: Never Used  . Alcohol use No  . Drug use: No  . Sexual activity: Yes    Birth control/ protection: Surgical   Other Topics Concern  . Not on file   Social History Narrative   Lives in [place], [place] with husband.  Dialysis pt - mwf.     Review of Systems  General:  No chills, fever, night sweats or weight changes.  Cardiovascular:  No chest pain, dyspnea on exertion, edema, orthopnea, palpitations, paroxysmal nocturnal dyspnea. Dermatological: No rash, lesions/masses Respiratory: No cough, dyspnea Urologic: No hematuria, dysuria Abdominal:   No nausea, vomiting, diarrhea, bright red blood per rectum, melena, or hematemesis Neurologic:  No visual changes, wkns, changes in mental status. All other systems reviewed and are otherwise negative except as noted above.  Physical Exam  Blood pressure (!) 161/72, pulse 74, temperature 98.2 F (36.8 C), temperature source Oral, resp. rate 24, height _0  (1.575 m), weight 127 lb (57.6  kg), SpO2 97 %.  General: Pleasant, NAD Psych: Normal affect. Neuro: Alert and oriented X 3. Moves all extremities spontaneously. HEENT: Normal  Neck: Supple without bruits or JVD. Lungs:  Resp regular and unlabored, CTA. Heart: RRR no s3, s4, or murmurs. Right jugular vein catheter noted Abdomen: Soft, non-tender, non-distended, BS + x 4.  Extremities: No clubbing, cyanosis or edema. DP/PT/Radials 2+ and equal bilaterally.  Labs   Recent Labs  07/27/16 0355  TROPONINI 1.52*   Lab Results  Component Value Date   WBC 11.7 (H) 07/27/2016   HGB 9.2 (L) 07/27/2016   HCT 28.5 (L) 07/27/2016   MCV 100.7 (H) 07/27/2016   PLT 303 07/27/2016    Recent Labs Lab 07/27/16 0355  NA 134*  K 4.3  CL 92*  CO2 26  BUN 47*  CREATININE 7.99*  CALCIUM 8.8*  GLUCOSE 147*   Lab Results  Component Value Date   CHOL 129 07/07/2012   HDL 51 07/07/2012   LDLCALC 59 07/07/2012   TRIG 105 01/21/2015   Lab Results  Component Value Date   DDIMER 1.97 (H) 07/03/2012    Radiology/Studies  Dg Chest 2 View  Result Date: 07/27/2016 CLINICAL DATA:  76 y/o  F; headache and chills. EXAM: CHEST  2 VIEW COMPARISON:  07/16/2016 chest radiograph. FINDINGS: Stable cardiac silhouette within normal limits. Right central venous catheter tip projects over the right atrium. Post median sternotomy with wires and alignment. Aortic atherosclerosis with calcification. No acute osseous abnormality is evident. Small left pleural effusion is slightly increased. Pulmonary venous hypertension. No focal consolidation. Minor left basilar atelectasis. IMPRESSION: Small left pleural effusion and minor left basilar atelectasis. Pulmonary venous hypertension. No focal consolidation. Electronically Signed   By: Kristine Garbe M.D.   On: 07/27/2016 04:38  Dg Chest 2 View  Result Date: 07/16/2016 CLINICAL DATA:  Productive cough starting last night EXAM: CHEST  2 VIEW COMPARISON:  07/10/2016 FINDINGS: Mild  cardiomegaly. Status post CABG with multiple coronary stents. Mild scarring at the right base. Aeration of the bases has improved since prior. There is no edema, consolidation, effusion, or pneumothorax. IMPRESSION: No evidence of acute disease. Electronically Signed   By: Monte Fantasia M.D.   On: 07/16/2016 14:15   Dg Chest 2 View  Result Date: 07/10/2016 CLINICAL DATA:  Shortness of breath. EXAM: CHEST  2 VIEW COMPARISON:  Radiograph of July 08, 2016. FINDINGS: Stable cardiomediastinal silhouette. Status post coronary artery bypass graft. Atherosclerosis of thoracic aorta is noted. No pneumothorax is noted. Stable bibasilar subsegmental atelectasis is noted with associated pleural effusions. Bony thorax is unremarkable. IMPRESSION: Stable bibasilar subsegmental atelectasis with associated pleural effusions. Aortic atherosclerosis. Electronically Signed   By: Marijo Conception, M.D.   On: 07/10/2016 08:34   Ct Head Wo Contrast  Result Date: 07/27/2016 CLINICAL DATA:  Headache. EXAM: CT HEAD WITHOUT CONTRAST TECHNIQUE: Contiguous axial images were obtained from the base of the skull through the vertex without intravenous contrast. COMPARISON:  CT 07/08/2016 FINDINGS: Brain: No evidence of acute infarction, hemorrhage, hydrocephalus, extra-axial collection or mass lesion/mass effect. Stable atrophy and chronic small vessel ischemia. Chronic lacunar infarct in the left external capsule. Vascular: Atherosclerosis of skullbase vasculature without hyperdense vessel or abnormal calcification. Skull: Normal. Negative for fracture or focal lesion. Sinuses/Orbits: Minimal mucosal thickening in left side of sphenoid sinus. Remaining sinuses are clear. Mastoid air cells are well-aerated. Other: None. IMPRESSION: No acute intracranial abnormality. Stable atrophy and chronic small vessel ischemia. Electronically Signed   By: Jeb Levering M.D.   On: 07/27/2016 04:40   Ct Head Wo Contrast  Result Date:  07/08/2016 CLINICAL DATA:  Elevated blood pressure after dialysis currently with headache. No known injury. EXAM: CT HEAD WITHOUT CONTRAST TECHNIQUE: Contiguous axial images were obtained from the base of the skull through the vertex without intravenous contrast. COMPARISON:  Head CTs from 05/19/2016 and 03/09/2016 FINDINGS: Brain: Chronic mild cerebral atrophy without ventricular dilatation. Chronic small vessel ischemic disease of the white matter noted bilaterally with chronic left lacunar infarct in the left putamen/external capsule. No acute intracranial hemorrhage, midline shift or mass. No extra-axial fluid collection. No effacement of the basal cisterns. Vascular: Vascular calcifications in the intracranial carotid arteries and both vertebral arteries. Skull: No acute osseous abnormality. Sinuses/Orbits: Mucous retention cyst in the sphenoid. Orbits are nonsuspicious mastoids appear clear. Other: None IMPRESSION: Chronic mild cerebral atrophy with small vessel ischemic disease of periventricular white matter. Chronic left putamenal/external capsule lacunar infarct. No acute intracranial abnormality noted. Electronically Signed   By: Ashley Royalty M.D.   On: 07/08/2016 21:14   Ir Fluoro Guide Cv Line Right  Result Date: 07/26/2016 INDICATION: End-stage renal disease, clotted AV fistula, hyperkalemia EXAM: NON TUNNELED CENTRAL VENOUS HEMODIALYSIS CATHETER WITH ULTRASOUND FLUOROSCOPIC GUIDANCE MEDICATIONS: 1% lidocaine locally ANESTHESIA/SEDATION: Moderate (conscious) sedation was employed during this procedure. A total of Versed 0.5 mg was administered intravenously. Moderate Sedation Time: 15 minutes. The patient's level of consciousness and vital signs were monitored continuously by radiology nursing throughout the procedure under my direct supervision. FLUOROSCOPY TIME:  Fluoroscopy Time: 0 minutes 24 seconds (1 mGy). COMPLICATIONS: None immediate. PROCEDURE: Informed written consent was obtained from  the patient after a discussion of the risks, benefits, and alternatives to treatment. Questions regarding the procedure were encouraged and answered. The  right neck and chest were prepped with chlorhexidine in a sterile fashion, and a sterile drape was applied covering the operative field. Maximum barrier sterile technique with sterile gowns and gloves were used for the procedure. A timeout was performed prior to the initiation of the procedure. After creating a small venotomy incision, a micropuncture kit was utilized to access the right internal jugular vein under direct, real-time ultrasound guidance after the overlying soft tissues were anesthetized with 1% lidocaine with epinephrine. Ultrasound image documentation was performed. Micro guidewire advanced. Four [place] dilator inserted. Amplatz guidewire advanced into the IVC. Tract dilatation performed to insert a 20 cm temporary dialysis catheter. Tip position at the SVC RA junction. Catheter secured with a Prolene suture and a sterile dressing. Blood aspirated easily followed by saline and heparin flushes. External caps applied. Patient tolerated the procedure well. No immediate complication. IMPRESSION: Successful ultrasound and fluoroscopic insertion of a 20 cm temporary right IJ dialysis catheter. Tip SVC RA junction. Ready for use. Electronically Signed   By: Jerilynn Mages.  Shick M.D.   On: 07/26/2016 15:28   Ir US Guide Vasc Access Right  Result Date: 07/26/2016 INDICATION: End-stage renal disease, clotted AV fistula, hyperkalemia EXAM: NON TUNNELED CENTRAL VENOUS HEMODIALYSIS CATHETER WITH ULTRASOUND FLUOROSCOPIC GUIDANCE MEDICATIONS: 1% lidocaine locally ANESTHESIA/SEDATION: Moderate (conscious) sedation was employed during this procedure. A total of Versed 0.5 mg was administered intravenously. Moderate Sedation Time: 15 minutes. The patient's level of consciousness and vital signs were monitored continuously by radiology nursing throughout the procedure under  my direct supervision. FLUOROSCOPY TIME:  Fluoroscopy Time: 0 minutes 24 seconds (1 mGy). COMPLICATIONS: None immediate. PROCEDURE: Informed written consent was obtained from the patient after a discussion of the risks, benefits, and alternatives to treatment. Questions regarding the procedure were encouraged and answered. The right neck and chest were prepped with chlorhexidine in a sterile fashion, and a sterile drape was applied covering the operative field. Maximum barrier sterile technique with sterile gowns and gloves were used for the procedure. A timeout was performed prior to the initiation of the procedure. After creating a small venotomy incision, a micropuncture kit was utilized to access the right internal jugular vein under direct, real-time ultrasound guidance after the overlying soft tissues were anesthetized with 1% lidocaine with epinephrine. Ultrasound image documentation was performed. Micro guidewire advanced. Four [place] dilator inserted. Amplatz guidewire advanced into the IVC. Tract dilatation performed to insert a 20 cm temporary dialysis catheter. Tip position at the SVC RA junction. Catheter secured with a Prolene suture and a sterile dressing. Blood aspirated easily followed by saline and heparin flushes. External caps applied. Patient tolerated the procedure well. No immediate complication. IMPRESSION: Successful ultrasound and fluoroscopic insertion of a 20 cm temporary right IJ dialysis catheter. Tip SVC RA junction. Ready for use. Electronically Signed   By: Jerilynn Mages.  Shick M.D.   On: 07/26/2016 15:28   Dg Chest Port 1 View  Result Date: 07/09/2016 CLINICAL DATA:  Dyspnea and cough. EXAM: PORTABLE CHEST 1 VIEW COMPARISON:  06/15/2016 CXR FINDINGS: Patient is status post median sternotomy. There is stable cardiomegaly. The aorta is atherosclerotic without aneurysm. Bibasilar atelectasis right greater than left is noted. Slightly more confluent appearing airspace opacities at the right  lung base cannot exclude pneumonia. No acute osseous appearing abnormality. IMPRESSION: Cardiomegaly with aortic atherosclerosis. Bibasilar atelectasis with findings suspicious for superimposed right lower lobe pneumonia. Electronically Signed   By: Ashley Royalty M.D.   On: 07/09/2016 00:51    ECG  Normal sinus rhythm  with T-wave inversion in the lateral leads. Baseline LVH.  ASSESSMENT AND PLAN  1. Chest pain with elevated trop: Occurred in the setting of hypertensive urgency. Previous cardiac catheterization report was reviewed, despite having proximal blockages, all for vein graft to distal artery was completely patent, therefore she was getting adequate blood backflow. She says she only had one episode of chest pain last night, her troponin is higher than normal. EKG however continued to show T-wave inversion in the lateral leads. We'll continue to trend serial troponin, may consider additional workup if troponin going up further. Otherwise recommend conservative therapy with up titration of Imdur. Given her CAD, we will also start on low dose BB metoprolol 12.63m BID. Given her bradycardia and PEA arrest in 05/2016, will need close monitor, PEA arrest likely related to hyperkalemia at the time (her labetalol was discontinued in Nov 2017)  2. Anemia: We'll defer to internal medicine team.  3. Hypertensive urgency, recurrent: Unfortunately, she has recurrent high blood pressure and the low blood pressure. We will increase her Imdur slightly, hopefully this will not interfere with her dialysis.  - Currently on amlodipine, hydralazine, clonidine and Imdur.  4. CAD s/p 4v CABG 2013  5. ESRD on HD MWF: Had a temporary catheter placed on Monday with her left upper extremity AV fistula clotted off. Underwent urgent dialysis yesterday.  6. chronic diastolic heart failure: She does not appears to be volume overloaded on physical exam.  7. history of carotid artery stenosis with 656-81%LICA 92/7517 but  less than 40% stenosis bilaterally on repeat carotid Doppler 08/14/2014   8. HLD: on zocor  8. TIA   Signed, HAlmyra Deforest PA-C 07/27/2016, 2:54 PM  Agree with note by HAlmyra DeforestPA-C  76year old married African-American female patient of Dr. KJacinta Shoehistory of CAD status post remote bypass grafting. She underwent cardiac catheterization in 2015 revealing patent grafts. Her problems include labile hypertension, chronic renal insufficiency on hemodialysis, peripheral arterial disease with known carotid artery disease as well as anemia. She had a PEA arrest back late last year and was resuscitated with CPR. She's had chest pain symptoms which sound somewhat musculoskeletal. Her left upper extremity AV fistula has thrombosed and she had a temporary dialysis catheter placed and underwent dialysis yesterday. Last night she developed 5 minutes of substernal chest pain relieved with sublingual nitroglycerin. She presents today for further evaluation. She is pain-free with blood pressure in the 1001systolic range. Her troponin was mildly elevated at 1.5. Her EKG shows sinus rhythm with LVH and repolarization changes unchanged from prior EKGs. She has had mild elevation in troponins in the past. Her hemoglobin likewise is somewhat lower than it has been recently in the mid 80 range with previously was in the 11 range although she has had anemia in the past. I'm not inclined to perform an invasive test at this time. We can increase her Imdur to 90 mg a day and add low-dose beta blocker. We will trend her troponins. Her troponins do not rise and she is pain-free she can be discharged home pending a plan for dialysis. She may need vascular consult for creation of a right upper extremity AV fistula.  JLorretta Harp M.D., [place] FLaverta BaltimoreFHarvey[address] [place]  [id number]/17/2018 4:06 PM

## 2016-07-27 NOTE — ED Notes (Signed)
Attempted IV in r forearm but was unsuccessful.

## 2016-07-27 NOTE — ED Triage Notes (Signed)
[facility] hemodialysis cath last night, once pt got home pt started having a headache, chills,

## 2016-07-27 NOTE — ED Notes (Signed)
Aaron Edelman RN attempted IV placement using [place] in right upper arm, unsuccessful.  Pt declines further attempts as she has been stuck multiple times in an attempt to gain IV access and obtain ordered blood samples.  No labs can be sent at this time as blood sample was not able to be obtained.

## 2016-07-28 ENCOUNTER — Inpatient Hospital Stay (HOSPITAL_COMMUNITY): Admission: RE | Admit: 2016-07-28 | Payer: Medicare HMO | Source: Ambulatory Visit

## 2016-07-28 ENCOUNTER — Inpatient Hospital Stay (HOSPITAL_COMMUNITY): Payer: Medicare HMO

## 2016-07-28 DIAGNOSIS — I5033 Acute on chronic diastolic (congestive) heart failure: Secondary | ICD-10-CM

## 2016-07-28 DIAGNOSIS — N186 End stage renal disease: Secondary | ICD-10-CM | POA: Diagnosis not present

## 2016-07-28 DIAGNOSIS — I120 Hypertensive chronic kidney disease with stage 5 chronic kidney disease or end stage renal disease: Secondary | ICD-10-CM | POA: Diagnosis not present

## 2016-07-28 DIAGNOSIS — I169 Hypertensive crisis, unspecified: Secondary | ICD-10-CM | POA: Diagnosis not present

## 2016-07-28 DIAGNOSIS — I161 Hypertensive emergency: Secondary | ICD-10-CM | POA: Diagnosis not present

## 2016-07-28 DIAGNOSIS — I214 Non-ST elevation (NSTEMI) myocardial infarction: Secondary | ICD-10-CM

## 2016-07-28 DIAGNOSIS — Z4901 Encounter for fitting and adjustment of extracorporeal dialysis catheter: Secondary | ICD-10-CM | POA: Diagnosis not present

## 2016-07-28 DIAGNOSIS — N185 Chronic kidney disease, stage 5: Secondary | ICD-10-CM | POA: Diagnosis not present

## 2016-07-28 DIAGNOSIS — Z992 Dependence on renal dialysis: Secondary | ICD-10-CM | POA: Diagnosis not present

## 2016-07-28 DIAGNOSIS — I10 Essential (primary) hypertension: Secondary | ICD-10-CM | POA: Diagnosis not present

## 2016-07-28 DIAGNOSIS — I2510 Atherosclerotic heart disease of native coronary artery without angina pectoris: Secondary | ICD-10-CM

## 2016-07-28 DIAGNOSIS — N2581 Secondary hyperparathyroidism of renal origin: Secondary | ICD-10-CM | POA: Diagnosis not present

## 2016-07-28 DIAGNOSIS — I5032 Chronic diastolic (congestive) heart failure: Secondary | ICD-10-CM | POA: Diagnosis not present

## 2016-07-28 DIAGNOSIS — D62 Acute posthemorrhagic anemia: Secondary | ICD-10-CM

## 2016-07-28 DIAGNOSIS — D631 Anemia in chronic kidney disease: Secondary | ICD-10-CM | POA: Diagnosis not present

## 2016-07-28 DIAGNOSIS — I249 Acute ischemic heart disease, unspecified: Secondary | ICD-10-CM | POA: Diagnosis not present

## 2016-07-28 DIAGNOSIS — R195 Other fecal abnormalities: Secondary | ICD-10-CM | POA: Diagnosis not present

## 2016-07-28 DIAGNOSIS — K558 Other vascular disorders of intestine: Secondary | ICD-10-CM | POA: Diagnosis not present

## 2016-07-28 DIAGNOSIS — R079 Chest pain, unspecified: Secondary | ICD-10-CM | POA: Diagnosis not present

## 2016-07-28 LAB — ECHOCARDIOGRAM COMPLETE
Height: 62 in
Weight: 2041.6 oz

## 2016-07-28 LAB — RETICULOCYTES
RBC.: 2.63 MIL/uL — AB (ref 3.87–5.11)
RETIC CT PCT: 2.9 % (ref 0.4–3.1)
Retic Count, Absolute: 76.3 10*3/uL (ref 19.0–186.0)

## 2016-07-28 LAB — HEMOGLOBIN AND HEMATOCRIT, BLOOD
HCT: 24.2 % — ABNORMAL LOW (ref 36.0–46.0)
HCT: 34.2 % — ABNORMAL LOW (ref 36.0–46.0)
HEMATOCRIT: 26.4 % — AB (ref 36.0–46.0)
HEMOGLOBIN: 7.6 g/dL — AB (ref 12.0–15.0)
HEMOGLOBIN: 11.1 g/dL — AB (ref 12.0–15.0)
Hemoglobin: 8.3 g/dL — ABNORMAL LOW (ref 12.0–15.0)

## 2016-07-28 LAB — IRON AND TIBC
Iron: 20 ug/dL — ABNORMAL LOW (ref 28–170)
SATURATION RATIOS: 11 % (ref 10.4–31.8)
TIBC: 185 ug/dL — AB (ref 250–450)
UIBC: 165 ug/dL

## 2016-07-28 LAB — CBC
HEMATOCRIT: 23.8 % — AB (ref 36.0–46.0)
HEMOGLOBIN: 7.4 g/dL — AB (ref 12.0–15.0)
MCH: 31.4 pg (ref 26.0–34.0)
MCHC: 31.1 g/dL (ref 30.0–36.0)
MCV: 100.8 fL — ABNORMAL HIGH (ref 78.0–100.0)
Platelets: 214 10*3/uL (ref 150–400)
RBC: 2.36 MIL/uL — AB (ref 3.87–5.11)
RDW: 18.1 % — ABNORMAL HIGH (ref 11.5–15.5)
WBC: 7.8 10*3/uL (ref 4.0–10.5)

## 2016-07-28 LAB — RENAL FUNCTION PANEL
Albumin: 2.8 g/dL — ABNORMAL LOW (ref 3.5–5.0)
Anion gap: 15 (ref 5–15)
BUN: 64 mg/dL — AB (ref 6–20)
CO2: 23 mmol/L (ref 22–32)
Calcium: 7.3 mg/dL — ABNORMAL LOW (ref 8.9–10.3)
Chloride: 97 mmol/L — ABNORMAL LOW (ref 101–111)
Creatinine, Ser: 10.68 mg/dL — ABNORMAL HIGH (ref 0.44–1.00)
GFR calc Af Amer: 4 mL/min — ABNORMAL LOW (ref >60)
GFR, EST NON AFRICAN AMERICAN: 3 mL/min — AB (ref >60)
Glucose, Bld: 112 mg/dL — ABNORMAL HIGH (ref 65–99)
Phosphorus: 5.9 mg/dL — ABNORMAL HIGH (ref 2.5–4.6)
Potassium: 4.5 mmol/L (ref 3.5–5.1)
Sodium: 135 mmol/L (ref 135–145)

## 2016-07-28 LAB — TROPONIN I
TROPONIN I: 2.08 ng/mL — AB (ref <0.03)
TROPONIN I: 1.14 ng/mL — AB (ref <0.03)
Troponin I: 2.59 ng/mL (ref <0.03)

## 2016-07-28 LAB — FOLATE: FOLATE: 32.2 ng/mL (ref >5.9)

## 2016-07-28 LAB — FERRITIN: Ferritin: 934 ng/mL — ABNORMAL HIGH (ref 11–307)

## 2016-07-28 LAB — PREPARE RBC (CROSSMATCH)

## 2016-07-28 LAB — VITAMIN B12: Vitamin B-12: 458 pg/mL (ref 180–914)

## 2016-07-28 MED ORDER — SODIUM CHLORIDE 0.9 % IV SOLN: Freq: Once | INTRAVENOUS | Status: DC

## 2016-07-28 MED ORDER — REGADENOSON 0.4 MG/5ML IV SOLN
0.4000 mg | Freq: Once | INTRAVENOUS | Status: AC
  Administered 2016-07-28: 0.4 mg via INTRAVENOUS

## 2016-07-28 MED ORDER — TECHNETIUM TC 99M TETROFOSMIN IV KIT
30.0000 | PACK | Freq: Once | INTRAVENOUS | Status: AC | PRN
  Administered 2016-07-28: 30 via INTRAVENOUS

## 2016-07-28 MED ORDER — TECHNETIUM TC 99M TETROFOSMIN IV KIT
10.0000 | PACK | Freq: Once | INTRAVENOUS | Status: AC | PRN
  Administered 2016-07-28: 10 via INTRAVENOUS

## 2016-07-28 MED ORDER — REGADENOSON 0.4 MG/5ML IV SOLN
INTRAVENOUS | Status: AC
  Administered 2016-07-28: 0.4 mg via INTRAVENOUS
  Filled 2016-07-28: qty 5

## 2016-07-28 MED ORDER — PANTOPRAZOLE SODIUM 40 MG IV SOLR
40.0000 mg | Freq: Two times a day (BID) | INTRAVENOUS | Status: DC
  Administered 2016-07-28 – 2016-07-30 (×5): 40 mg via INTRAVENOUS
  Filled 2016-07-28 (×6): qty 40

## 2016-07-28 MED ORDER — HEPARIN SODIUM (PORCINE) 5000 UNIT/ML IJ SOLN
5000.0000 [IU] | Freq: Three times a day (TID) | INTRAMUSCULAR | Status: DC
  Administered 2016-07-29 (×2): 5000 [IU] via SUBCUTANEOUS
  Filled 2016-07-28 (×5): qty 1

## 2016-07-28 NOTE — Consult Note (Signed)
Referring Provider: [facility] -Dr. Reggy Eye Primary Care Physician:  [name], MD Primary Gastroenterologist: Lucio Edward, MD  Reason for Consultation:  Anemia, FOBT positive  HPI: Shawna [name] is a 76 y.o. female with multiple medical problems including ESRD on HD. She has a history of GERD, iron deficiency anemia, small bowel AVMs and adenomatous colon polyps.  She is due now for surveillance colonoscopy. We last saw patient in the office in 2016. She has since been followed by GI in [place]. She had an EGD late Oct 2016 with findings of erosive gastroduodenitis.   Patient admitted yesterday with chest pain. She had been significantly hypertensive at home with SBP of 250. EKG showed non-specific ST depression over precordial leads (unchanged from previous), troponin elevated at 1.5. She was found to be anemic with brown, heme positive stools. The pain radiated through to her back. No associated dyspnea or nausea. No chest pain today. Patient gets occasional lower abdominal discomfort and she has occasional constipation and diarrhea but bowel habits are unchanged. She thinks her weight is stable.    Past Medical History:  Diagnosis Date  . Anxiety   . Arthritis   . AVM (arteriovenous malformation) of colon   . CAD (coronary artery disease)    a. 12/2011 NSTEMI/Cath/PCI LCX (2.25x14 Resolute DES) & D1 (2.25x22 Resolute DES);  b. 01/2012 Cath/PCI: LM 30, LAD 30p, 40-50m D1 stent ok, 99 in sm branch of diag, LCX patent stent, OM1 20, RCA 95 ost (4.0x12 Promus DES), EF 55%;  c. 04/2012 Lexi Cardiolite  EF 48%, small area of scar @ base/mid inflat wall with mild peri-infarct ischemia.; CABG 12/4  . Carotid artery disease ([place]    a. 664-40%LICA, 93/4742  . Chronic bronchitis ([place]   . Chronic diastolic CHF (congestive heart failure) ([place]    a. 02/2012 Echo EF 60-65%, nl wall motion, Gr 1 DD, mod MR  . Colon cancer ([place] 1992  . Esophageal stricture   . ESRD on hemodialysis  ([place]    ESRD due to HTN, [facility] 2011 [facility]  Access is LUA AVF as of Sept 2014.   .Marland KitchenGERD (gastroesophageal reflux disease)   . High cholesterol 12/2011  . History of blood transfusion 07/2011; 12/2011; 01/2012 X 2; 04/2012  . History of gout   . History of lower GI bleeding   . Hypertension   . Iron deficiency anemia   . Mitral regurgitation    a. Moderate by echo, 02/2012  . Myocardial infarction   . Ovarian cancer ([place] 1992  . Pneumonia ~ 2009  . PUD (peptic ulcer disease)   . TIA (transient ischemic attack)     Past Surgical History:  Procedure Laterality Date  . ABDOMINAL HYSTERECTOMY  1992  . APPENDECTOMY  06/1990  . AV FISTULA PLACEMENT  07/2009   left upper arm  . COLON RESECTION  1992  . CORONARY ANGIOPLASTY WITH STENT PLACEMENT  12/15/11   "2"  . CORONARY ANGIOPLASTY WITH STENT PLACEMENT  y/2013   "1; makes total of 3" (05/02/2012)  . CORONARY ARTERY BYPASS GRAFT  06/13/2012   Procedure: CORONARY ARTERY BYPASS GRAFTING (CABG);  Surgeon: EGrace Isaac MD;  [place]  Service: Open Heart Surgery;  Laterality: N/A;  cabg x four;  using left internal mammary artery, and left leg greater saphenous vein harvested endoscopically  . DILATION AND CURETTAGE OF UTERUS    .  ESOPHAGOGASTRODUODENOSCOPY  01/20/2012   Procedure: ESOPHAGOGASTRODUODENOSCOPY (EGD);  Surgeon: Ladene Artist, MD,FACG;  Location[facility] END[facility]COPY;  Service: Endoscopy;  Laterality: N/A;  . ESOPHAGOGASTRODUODENOSCOPY N/A 03/26/2013   Procedure: ESOPHAGOGASTRODUODENOSCOPY (EGD);  Surgeon: Irene Shipper, MD;  Location[facility];  Service: Endoscopy;  Laterality: N/A;  . ESOPHAGOGASTRODUODENOSCOPY N/A 04/30/2015   Procedure: ESOPHAGOGASTRODUODENOSCOPY (EGD);  Surgeon: Rogene Houston, MD;  [facility];  Service: Endoscopy;  Laterality: N/A;  1pm - moved to 10/20 @ 1:10  . INTRAOPERATIVE TRANSESOPHAGEAL ECHOCARDIOGRAM  06/13/2012    Procedure: INTRAOPERATIVE TRANSESOPHAGEAL ECHOCARDIOGRAM;  Surgeon: Grace Isaac, MD;  Location: [place];  Service: Open Heart Surgery;  Laterality: N/A;  . IR GENERIC HISTORICAL  07/26/2016   IR FLUORO GUIDE CV LINE RIGHT 07/26/2016 Greggory Keen, MD [facility]-INTER[facility] RAD  . IR GENERIC HISTORICAL  07/26/2016   IR US GUIDE VASC ACCESS RIGHT 07/26/2016 Greggory Keen, MD [facility]  . LEFT HEART CATHETERIZATION WITH CORONARY ANGIOGRAM N/A 12/15/2011   Procedure: LEFT HEART CATHETERIZATION WITH CORONARY ANGIOGRAM;  Surgeon: Burnell Blanks, MD;  [facility];  Service: Cardiovascular;  Laterality: N/A;  . LEFT HEART CATHETERIZATION WITH CORONARY ANGIOGRAM N/A 01/10/2012   Procedure: LEFT HEART CATHETERIZATION WITH CORONARY ANGIOGRAM;  Surgeon: Peter M Martinique, MD;  Location: [facility] LAB;  Service: Cardiovascular;  Laterality: N/A;  . LEFT HEART CATHETERIZATION WITH CORONARY ANGIOGRAM N/A 06/08/2012   Procedure: LEFT HEART CATHETERIZATION WITH CORONARY ANGIOGRAM;  Surgeon: Burnell Blanks, MD;  Location: [facility] CATH LAB;  Service: Cardiovascular;  Laterality: N/A;  . LEFT HEART CATHETERIZATION WITH CORONARY/GRAFT ANGIOGRAM N/A 12/10/2013   Procedure: LEFT HEART CATHETERIZATION WITH Beatrix Fetters;  Surgeon: [name], MD;  Location: [facility] C[facility] LAB;  Service: Cardiovascular;  Laterality: N/A;  . OVARY SURGERY     ovarian cancer  . SHUNTOGRAM N/A 10/15/2013   Procedure: Fistulogram;  Surgeon: Serafina Mitchell, MD;  Location: [facility] C[facility] LAB;  Service: Cardiovascular;  Laterality: N/A;  . THROMBECTOMY / ARTERIOVENOUS GRAFT REVISION  2011   left upper arm  . TUBAL LIGATION  1980's    Prior to Admission medications   Medication Sig Start Date End Date Taking? Authorizing Provider  ALPRAZolam (XANAX) 0.25 MG tablet Take 0.25-0.5 mg by mouth at bedtime as needed for sleep.  11/19/15  Yes Historical Provider, MD  amiodarone (PACERONE) 200 MG tablet Take 1 tablet (200 mg total) by mouth  daily. 09/23/15  Yes Herminio Commons, MD  amLODipine (NORVASC) 2.5 MG tablet Take 2.5 mg by mouth daily.   Yes Historical Provider, MD  aspirin EC 81 MG tablet Take 81 mg by mouth daily.    Yes Historical Provider, MD  cloNIDine (CATAPRES) 0.2 MG tablet Take 1 tablet (0.2 mg total) by mouth 3 (three) times daily. 07/10/16  Yes Thurnell Lose, MD  hydrALAZINE (APRESOLINE) 50 MG tablet Take 1 tablet (50 mg total) by mouth every 8 (eight) hours. 07/10/16  Yes Thurnell Lose, MD  isosorbide mononitrate (IMDUR) 60 MG 24 hr tablet Take 1 tablet (60 mg total) by mouth daily. 07/10/16  Yes Thurnell Lose, MD  lanthanum (FOSRENOL) 1000 MG chewable tablet Chew 1 tablet (1,000 mg total) by mouth 3 (three) times daily with meals. 04/08/16  Yes Orvan Falconer, MD  multivitamin (RENA-VIT) TABS tablet Take 1 tablet by mouth daily.   Yes Historical Provider, MD  nitroGLYCERIN (NITROSTAT) 0.4 MG SL tablet Place 0.4 mg under the tongue every 5 (five) minutes as needed for  chest pain.   Yes Historical Provider, MD  omeprazole (PRILOSEC) 20 MG capsule TAKE ONE CAPSULE BY MOUTH ONCE DAILY 04/15/16  Yes Butch Penny, NP  SENSIPAR 30 MG tablet Take 30 mg by mouth daily with supper.  12/21/12  Yes Historical Provider, MD  simvastatin (ZOCOR) 20 MG tablet Take 1 tablet (20 mg total) by mouth at bedtime. 04/04/16  Yes Lendon Colonel, NP  epoetin alfa (EPOGEN,PROCRIT) 33354 UNIT/ML injection Inject 1 mL (10,000 Units total) into the vein every Monday, Wednesday, and Friday with hemodialysis. 04/11/16   Orvan Falconer, MD  fluticasone (FLONASE) 50 MCG/ACT nasal spray Place 2 sprays into the nose daily as needed for allergies.     Historical Provider, MD  nitroGLYCERIN (NITROSTAT) 0.4 MG SL tablet Place 1 tablet (0.4 mg total) under the tongue every 5 (five) minutes as needed for chest pain. 04/13/16 07/26/16  Herminio Commons, MD  PROAIR HFA 108 (90 BASE) MCG/ACT inhaler Inhale 1 puff into the lungs every 6 (six) hours as needed  for wheezing or shortness of breath.  01/09/15   Historical Provider, MD    Current Facility-Administered Medications  Medication Dose Route Frequency Provider Last Rate Last Dose  . 0.9 %  sodium chloride infusion  100 mL Intravenous PRN [name], MD      . 0.9 %  sodium chloride infusion  100 mL Intravenous PRN [name], MD      . 0.9 %  sodium chloride infusion   Intravenous Once Debbe Odea, MD      . acetaminophen (TYLENOL) tablet 1,000 mg  1,000 mg Oral Once Merryl Hacker, MD      . albuterol (PROVENTIL) (2.5 MG/3ML) 0.083% nebulizer solution 3 mL  3 mL Inhalation Q6H PRN Orvan Falconer, MD      . ALPRAZolam Duanne Moron) tablet 0.25-0.5 mg  0.25-0.5 mg Oral QHS PRN Orvan Falconer, MD      . alteplase (CATHFLO ACTIVASE) injection 2 mg  2 mg Intracatheter Once PRN [name], MD      . amiodarone (PACERONE) tablet 200 mg  200 mg Oral Daily Orvan Falconer, MD   200 mg at 07/27/16 1437  . amLODipine (NORVASC) tablet 2.5 mg  2.5 mg Oral Daily Lorretta Harp, MD   2.5 mg at 07/27/16 1217  . aspirin EC tablet 81 mg  81 mg Oral Daily Orvan Falconer, MD   81 mg at 07/28/16 1347  . cinacalcet (SENSIPAR) tablet 30 mg  30 mg Oral Q supper Orvan Falconer, MD   30 mg at 07/27/16 1901  . cloNIDine (CATAPRES) tablet 0.2 mg  0.2 mg Oral TID Orvan Falconer, MD   0.2 mg at 07/27/16 2200  . fluticasone (FLONASE) 50 MCG/ACT nasal spray 2 spray  2 spray Each Nare Daily Orvan Falconer, MD      . heparin injection 1,000 Units  1,000 Units Dialysis PRN [name], MD      . Derrill Memo ON 07/29/2016] heparin injection 5,000 Units  5,000 Units Subcutaneous Q8H Monia Sabal, PA-C      . hydrALAZINE (APRESOLINE) tablet 50 mg  50 mg Oral Q8H Waldemar Dickens, MD   50 mg at 07/28/16 0646  . isosorbide mononitrate (IMDUR) 24 hr tablet 90 mg  90 mg Oral Daily Almyra Deforest, [place]   90 mg at 07/28/16 1347  . lanthanum (FOSRENOL) chewable tablet 1,000 mg  1,000 mg Oral TID WC Orvan Falconer, MD   1,000 mg at 07/28/16 1347  . lidocaine (  PF) (XYLOCAINE) 1 %  injection 5 mL  5 mL Intradermal PRN [name], MD      . lidocaine-prilocaine (EMLA) cream 1 application  1 application Topical PRN [name], MD      . menthol-cetylpyridinium (CEPACOL) lozenge 3 mg  1 lozenge Oral PRN Waldemar Dickens, MD   3 mg at 07/27/16 1902  . metoprolol tartrate (LOPRESSOR) tablet 12.5 mg  12.5 mg Oral BID Almyra Deforest, PA   12.5 mg at 07/27/16 2200  . multivitamin (RENA-VIT) tablet 1 tablet  1 tablet Oral Daily Orvan Falconer, MD   1 tablet at 07/28/16 1347  . [START ON 07/30/2016] pantoprazole (PROTONIX) injection 40 mg  40 mg Intravenous Q12H Orvan Falconer, MD      . pantoprazole (PROTONIX) injection 40 mg  40 mg Intravenous Q12H Debbe Odea, MD   40 mg at 07/28/16 1349  . pentafluoroprop-tetrafluoroeth (GEBAUERS) aerosol 1 application  1 application Topical PRN [name], MD      . simvastatin (ZOCOR) tablet 20 mg  20 mg Oral QHS Orvan Falconer, MD   20 mg at 07/27/16 2131  . sodium chloride flush (NS) 0.9 % injection 10-40 mL  10-40 mL Intracatheter PRN Waldemar Dickens, MD      . sodium chloride flush (NS) 0.9 % injection 3 mL  3 mL Intravenous Q12H Orvan Falconer, MD   3 mL at 07/28/16 1349    Allergies as of 07/27/2016 - Review Complete 07/27/2016  Allergen Reaction Noted  . Aspirin Other (See Comments) 12/15/2011  . Contrast media [iodinated diagnostic agents] Itching 12/15/2011  . Iron Itching and Other (See Comments) 05/02/2012  . Penicillins Other (See Comments) 12/15/2011  . Plavix [clopidogrel bisulfate] Rash 01/05/2012  . Amlodipine Swelling and Other (See Comments) 01/19/2015  . Bactrim [sulfamethoxazole-trimethoprim] Rash 12/15/2011  . Dexilant [dexlansoprazole] Other (See Comments) 05/29/2013  . Morphine and related Itching 09/16/2014  . Nitrofurantoin Hives 05/27/2015  . Prilosec [omeprazole] Other (See Comments) 10/15/2013  . Venofer [ferric oxide] Itching 05/02/2012  . Tylenol [acetaminophen] Other (See Comments) 07/08/2016  . Levaquin [levofloxacin in  d5w] Rash 10/04/2013  . Protonix [pantoprazole sodium] Rash 03/21/2013    Family History  Problem Relation Age of Onset  . Heart disease Mother     Heart Disease before age 52  . Hyperlipidemia Mother   . Hypertension Mother   . Diabetes Mother   . Heart attack Mother   . Heart disease Father     Heart Disease before age 85  . Hyperlipidemia Father   . Hypertension Father   . Diabetes Father   . Diabetes Sister   . Hypertension Sister   . Diabetes Brother   . Hyperlipidemia Brother   . Heart attack Brother   . Hypertension Sister   . Heart attack Brother   . Other      noncontributory for early CAD  . Colon cancer Neg Hx   . Esophageal cancer Neg Hx   . Liver disease Neg Hx   . Kidney disease Neg Hx   . Colon polyps Neg Hx     Social History   Social History  . Marital status: Married    Spouse name: N/A  . Number of children: N/A  . Years of education: N/A   Occupational History  . Not on file.   Social History Main Topics  . Smoking status: Never Smoker  . Smokeless tobacco: Never Used  . Alcohol use No  . Drug use: No  .  Sexual activity: Yes    Birth control/ protection: Surgical   Other Topics Concern  . Not on file   Social History Narrative   Lives in [place], [place] with husband.  Dialysis pt - [facility].    Review of Systems: Decreased appetite. All other systems reviewed and negative except where noted in HPI.  Physical Exam: Vital signs in last 24 hours: Temp:  [97.8 F (36.6 C)-98.4 F (36.9 C)] 97.8 F (36.6 C) (01/18 0751) Pulse Rate:  [76-120] 108 (01/18 1159) Resp:  [14-21] 14 (01/18 0751) BP: (120-223)/(51-81) 192/72 (01/18 1158) SpO2:  [97 %-100 %] 97 % (01/18 0751) Weight:  [127 lb 9.6 oz (57.9 kg)] 127 lb 9.6 oz (57.9 kg) (01/18 0413) Last BM Date: 07/27/16 General:   Alert,  well-developed black female in NAD Head:  Normocephalic and atraumatic. Eyes:  Sclera clear, no icterus.   Conjunctiva pink. Ears:  Normal auditory  acuity. Nose:  No deformity, discharge,  or lesions  Neck: no masses felt. Dialysis cath right neck Lungs:  Clear throughout to auscultation.   No wheezes, crackles, or rhonchi.  Heart:  Regular rate and rhythm; no murmurs, clicks, rubs,  or gallops. Abdomen:  Soft,nontender, BS active,nonpalp mass or hsm.   Rectal:  Deferred  Msk:  Symmetrical without gross deformities. . Pulses:  Normal pulses noted. Extremities:  Without clubbing or edema. Neurologic:  Alert and  oriented x4;  grossly normal neurologically. Skin:  Intact without significant lesions or rashes.. Psych:  Alert and cooperative. Normal mood and affect.  Intake/Output from previous day: 01/17 0701 - 01/18 0700 In: 877.6 [P.O.:360; I.V.:417.6; IV Piggyback:100] Out: -  Intake/Output this shift: No intake/output data recorded.  Lab Results:  Recent Labs  07/27/16 0355  07/27/16 1640 07/28/16 0232 07/28/16 0817  WBC 11.7*  --  9.8  --  7.8  HGB 8.6*  < > 8.1* 7.6* 7.4*  HCT 27.2*  < > 25.8* 24.2* 23.8*  PLT 303  --  258  --  214  < > = values in this interval not displayed. BMET  Recent Labs  07/26/16 1847 07/27/16 0355 07/27/16 1640  NA 135 134* 137  K 2.4* 4.3 4.6  CL 95* 92* 99*  CO2  --  26 26  GLUCOSE 99 147* 97  BUN 23* 47* 53*  CREATININE 5.00* 7.99* 9.11*  CALCIUM  --  8.8* 8.1*   LFT  Recent Labs  07/27/16 1640  ALBUMIN 2.8*   PT/INR  Recent Labs  07/26/16 1050  LABPROT 13.4  INR 1.02    Studies/Results: Dg Chest 2 View  Result Date: 07/27/2016 CLINICAL DATA:  76 y/o  F; headache and chills. EXAM: CHEST  2 VIEW COMPARISON:  07/16/2016 chest radiograph. FINDINGS: Stable cardiac silhouette within normal limits. Right central venous catheter tip projects over the right atrium. Post median sternotomy with wires and alignment. Aortic atherosclerosis with calcification. No acute osseous abnormality is evident. Small left pleural effusion is slightly increased. Pulmonary venous  hypertension. No focal consolidation. Minor left basilar atelectasis. IMPRESSION: Small left pleural effusion and minor left basilar atelectasis. Pulmonary venous hypertension. No focal consolidation. Electronically Signed   By: Kristine Garbe M.D.   On: 07/27/2016 04:38   Ct Head Wo Contrast  Result Date: 07/27/2016 CLINICAL DATA:  Headache. EXAM: CT HEAD WITHOUT CONTRAST TECHNIQUE: Contiguous axial images were obtained from the base of the skull through the vertex without intravenous contrast. COMPARISON:  CT 07/08/2016 FINDINGS: Brain: No evidence of acute infarction, hemorrhage,  hydrocephalus, extra-axial collection or mass lesion/mass effect. Stable atrophy and chronic small vessel ischemia. Chronic lacunar infarct in the left external capsule. Vascular: Atherosclerosis of skullbase vasculature without hyperdense vessel or abnormal calcification. Skull: Normal. Negative for fracture or focal lesion. Sinuses/Orbits: Minimal mucosal thickening in left side of sphenoid sinus. Remaining sinuses are clear. Mastoid air cells are well-aerated. Other: None. IMPRESSION: No acute intracranial abnormality. Stable atrophy and chronic small vessel ischemia. Electronically Signed   By: Jeb Levering M.D.   On: 07/27/2016 04:40   Ir Fluoro Guide Cv Line Right  Result Date: 07/26/2016 INDICATION: End-stage renal disease, clotted AV fistula, hyperkalemia EXAM: NON TUNNELED CENTRAL VENOUS HEMODIALYSIS CATHETER WITH ULTRASOUND FLUOROSCOPIC GUIDANCE MEDICATIONS: 1% lidocaine locally ANESTHESIA/SEDATION: Moderate (conscious) sedation was employed during this procedure. A total of Versed 0.5 mg was administered intravenously. Moderate Sedation Time: 15 minutes. The patient's level of consciousness and vital signs were monitored continuously by radiology nursing throughout the procedure under my direct supervision. FLUOROSCOPY TIME:  Fluoroscopy Time: 0 minutes 24 seconds (1 mGy). COMPLICATIONS: None immediate.  PROCEDURE: Informed written consent was obtained from the patient after a discussion of the risks, benefits, and alternatives to treatment. Questions regarding the procedure were encouraged and answered. The right neck and chest were prepped with chlorhexidine in a sterile fashion, and a sterile drape was applied covering the operative field. Maximum barrier sterile technique with sterile gowns and gloves were used for the procedure. A timeout was performed prior to the initiation of the procedure. After creating a small venotomy incision, a micropuncture kit was utilized to access the right internal jugular vein under direct, real-time ultrasound guidance after the overlying soft tissues were anesthetized with 1% lidocaine with epinephrine. Ultrasound image documentation was performed. Micro guidewire advanced. Four [place] dilator inserted. Amplatz guidewire advanced into the IVC. Tract dilatation performed to insert a 20 cm temporary dialysis catheter. Tip position at the SVC RA junction. Catheter secured with a Prolene suture and a sterile dressing. Blood aspirated easily followed by saline and heparin flushes. External caps applied. Patient tolerated the procedure well. No immediate complication. IMPRESSION: Successful ultrasound and fluoroscopic insertion of a 20 cm temporary right IJ dialysis catheter. Tip SVC RA junction. Ready for use. Electronically Signed   By: Jerilynn Mages.  Shick M.D.   On: 07/26/2016 15:28    Result Date: 07/27/2016 CLINICAL DATA:  Evaluate catheter. EXAM: PORTABLE CHEST 1 VIEW COMPARISON:  Chest radiograph July 27, 2016 FINDINGS: Cardiac silhouette is mildly enlarged. Pulmonary vascular congestion. Mild calcific atherosclerosis aortic knob mild. Status post median sternotomy for CABG. Coronary artery stent. Stable appearance of RIGHT internal jugular central venous catheter with distal tip projecting in proximal atria. No pneumothorax. Bibasilar strandy densities. Blunting of the LEFT  costophrenic angle. No consolidation. Soft tissue planes and included osseous structures are nonsuspicious. IMPRESSION: Stable RIGHT internal jugular central venous catheter distal tip projecting in RIGHT atrium, recommend 2 cm retraction. Bibasilar atelectasis and trace LEFT pleural effusion versus pleural thickening. Mild cardiomegaly and pulmonary vascular congestion. Electronically Signed   By: Elon Alas M.D.   On: 07/27/2016 17:48    IMPRESSION / PLAN:   1. Macrocytic anemia with almost 4 gram drop in hgb from baseline around 11 to 7.4. She is heme positive. On baby ASA only. No overt GI bleeding. No associated shortness of breath. She was admitted with chest pain / elevated trop.  -Hx of intestinal AVS / gastroduodenitis. On PPI at home. Suspect she has occult bleeding from AVMs. If medically stable  will proceed with am enteroscopy with anesthesia support. The risks / benefits of the procedure were explained to the patient and she agrees to proceed. If study negative we can consider proceeding with colonoscopy.   2. Chest pain, elevated troponin with peak of 2.59 at 2:30am, down to 2.08 around 8am. Patient had myoview today, results pending. No chest pain at present.   3. Hx of adenomatous colon polyps. She has just become due for surveillance colonoscopy. Depending on clinical course we may need to do this inpatient. Otherwise her primary GI, Dr. Laural Golden in [place] can do it outpatient.   Multiple medical problems including ESRD on HD. Dialyzes MWF but off schedule with hyperkalemia and so going to dialysis today. She has temporary access in right neck   Tye Savoy NP 07/28/2016, 2:19 PM  Pager number [phone]

## 2016-07-28 NOTE — Consult Note (Signed)
Chief Complaint: Patient was seen in consultation today for  Tunneled dialysis catheter placement Chief Complaint  Patient presents with  . Headache   at the request of Dr Reggy Eye  Referring Physician(s): Dr Laurena Bering  Supervising Physician: Markus Daft  Patient Status: [facility] - In-pt  History of Present Illness: Shawna [name] is a 76 y.o. female   Hx PEA with CPR just 05/11/2016 Hx CAD/CABG 2013 HTN; 3 recent ED admissions for HTN crisis. LUE AVF clotted last week Right IJ Temporary dialysis cath placed in IR 07/26/16  Has been evaluated by Renal MD - Dr Hollie Salk for dialysis and needs Now scheduled for tunneled catheter placement  Past Medical History:  Diagnosis Date  . Anxiety   . Arthritis   . AVM (arteriovenous malformation) of colon   . CAD (coronary artery disease)    a. 12/2011 NSTEMI/Cath/PCI LCX (2.25x14 Resolute DES) & D1 (2.25x22 Resolute DES);  b. 01/2012 Cath/PCI: LM 30, LAD 30p, 40-47m D1 stent ok, 99 in sm branch of diag, LCX patent stent, OM1 20, RCA 95 ost (4.0x12 Promus DES), EF 55%;  c. 04/2012 Lexi Cardiolite  EF 48%, small area of scar @ base/mid inflat wall with mild peri-infarct ischemia.; CABG 12/4  . Carotid artery disease [place]    a. 685-27%LICA, 97/8242  . Chronic bronchitis ([place]   . Chronic diastolic CHF (congestive heart failure) ([place]    a. 02/2012 Echo EF 60-65%, nl wall motion, Gr 1 DD, mod MR  . [place]  . Esophageal stricture   . [place]D [place]ialysis ([place]    ESRD due to HTN, started dialysis 2011 [facility].  Access is LUA AVF as of Sept 2014.   .Marland KitchenGERD (gastroesophageal reflux disease)   . High cholesterol 12/2011  . History of blood transfusion 07/2011; 12/2011; 01/2012 X 2; 04/2012  . History of gout   . History of lower GI bleeding   . Hypertension   . Iron deficiency anemia   . Mitral regurgitation    a. Moderate by echo, 02/2012  . Myocardial infarction   . Ovarian  cancer ([place] 1992  . Pneumonia ~ 2009  . PUD (peptic ulcer disease)   . TIA (transient ischemic attack)     Past Surgical History:  Procedure Laterality Date  . ABDOMINAL HYSTERECTOMY  1992  . APPENDECTOMY  06/1990  . AV FISTULA PLACEMENT  07/2009   left upper arm  . COLON RESECTION  1992  . CORONARY ANGIOPLASTY WITH STENT PLACEMENT  12/15/11   "2"  . CORONARY ANGIOPLASTY WITH STENT PLACEMENT  y/2013   "1; makes total of 3" (05/02/2012)  . CORONARY ARTERY BYPASS GRAFT  06/13/2012   Procedure: CORONARY ARTERY BYPASS GRAFTING (CABG);  Surgeon: EGrace Isaac MD;  [facility]  Service: Open Heart Surgery;  Laterality: N/A;  cabg x four;  using left internal mammary artery, and left leg greater saphenous vein harvested endoscopically  . DILATION AND CURETTAGE OF UTERUS    . ESOPHAGOGASTRODUODENOSCOPY  01/20/2012   Procedure: ESOPHAGOGASTRODUODENOSCOPY (EGD);  Surgeon: MLadene Artist MD,FACG;  [facility];  Service: Endoscopy;  Laterality: N/A;  . ESOPHAGOGASTRODUODENOSCOPY N/A 03/26/2013   Procedure: ESOPHAGOGASTRODUODENOSCOPY (EGD);  Surgeon: JIrene Shipper MD;  [facility];  Service: Endoscopy;  Laterality: N/A;  . ESOPHAGOGASTRODUODENOSCOPY N/A 04/30/2015   Procedure: ESOPHAGOGASTRODUODENOSCOPY (EGD);  Surgeon: NRogene Houston MD;  [facility];  Service: Endoscopy;  Laterality: N/A;  1pm - moved to 10/20 @ 1:10  . INTRAOPERATIVE TRANSESOPHAGEAL ECHOCARDIOGRAM  06/13/2012   Procedure: INTRAOPERATIVE TRANSESOPHAGEAL ECHOCARDIOGRAM;  Surgeon: Grace Isaac, MD;  Location: [place];  Service: Open Heart Surgery;  Laterality: N/A;  . IR GENERIC HISTORICAL  07/26/2016   IR FLUORO GUIDE CV LINE RIGHT 07/26/2016 Greggory Keen, MD [facility]-INTER[facility] RAD  . IR GENERIC HISTORICAL  07/26/2016   IR US GUIDE VASC ACCESS RIGHT 07/26/2016 Greggory Keen, MD [facility]  . LEFT HEART CATHETERIZATION WITH CORONARY ANGIOGRAM N/A 12/15/2011   Procedure: LEFT HEART CATHETERIZATION  WITH CORONARY ANGIOGRAM;  Surgeon: Burnell Blanks, MD;  Location: [facility] CATH LAB;  Service: Cardiovascular;  Laterality: N/A;  . LEFT HEART CATHETERIZATION WITH CORONARY ANGIOGRAM N/A 01/10/2012   Procedure: LEFT HEART CATHETERIZATION WITH CORONARY ANGIOGRAM;  Surgeon: Peter M Martinique, MD;  Location[facility] C[facility] LAB;  Service: Cardiovascular;  Laterality: N/A;  . LEFT HEART CATHETERIZATION WITH CORONARY ANGIOGRAM N/A 06/08/2012   Procedure: LEFT HEART CATHETERIZATION WITH CORONARY ANGIOGRAM;  Surgeon: Burnell Blanks, MD;  Location: [facility] CATH LAB;  Service: Cardiovascular;  Laterality: N/A;  . LEFT HEART CATHETERIZATION WITH CORONARY/GRAFT ANGIOGRAM N/A 12/10/2013   Procedure: LEFT HEART CATHETERIZATION WITH Beatrix Fetters;  Surgeon: [name], MD;  [facility];  Service: Cardiovascular;  Laterality: N/A;  . OVARY SURGERY     ovarian cancer  . SHUNTOGRAM N/A 10/15/2013   Procedure: Fistulogram;  Surgeon: Serafina Mitchell, MD;  Location: [facility] LAB;  Service: Cardiovascular;  Laterality: N/A;  . THROMBECTOMY / ARTERIOVENOUS GRAFT REVISION  2011   left upper arm  . TUBAL LIGATION  1980's    Allergies: Aspirin; Contrast media [iodinated diagnostic agents]; Iron; Penicillins; Plavix [clopidogrel bisulfate]; Amlodipine; Bactrim [sulfamethoxazole-trimethoprim]; Dexilant [dexlansoprazole]; Morphine and related; Nitrofurantoin; Prilosec [omeprazole]; Venofer [ferric oxide]; Tylenol [acetaminophen]; Levaquin [levofloxacin in d5w]; and Protonix [pantoprazole sodium]  Medications: Prior to Admission medications   Medication Sig Start Date End Date Taking? Authorizing Provider  ALPRAZolam (XANAX) 0.25 MG tablet Take 0.25-0.5 mg by mouth at bedtime as needed for sleep.  11/19/15  Yes Historical Provider, MD  amiodarone (PACERONE) 200 MG tablet Take 1 tablet (200 mg total) by mouth daily. 09/23/15  Yes Herminio Commons, MD  amLODipine (NORVASC) 2.5 MG tablet Take 2.5 mg by  mouth daily.   Yes Historical Provider, MD  aspirin EC 81 MG tablet Take 81 mg by mouth daily.    Yes Historical Provider, MD  cloNIDine (CATAPRES) 0.2 MG tablet Take 1 tablet (0.2 mg total) by mouth 3 (three) times daily. 07/10/16  Yes Thurnell Lose, MD  hydrALAZINE (APRESOLINE) 50 MG tablet Take 1 tablet (50 mg total) by mouth every 8 (eight) hours. 07/10/16  Yes Thurnell Lose, MD  isosorbide mononitrate (IMDUR) 60 MG 24 hr tablet Take 1 tablet (60 mg total) by mouth daily. 07/10/16  Yes Thurnell Lose, MD  lanthanum (FOSRENOL) 1000 MG chewable tablet Chew 1 tablet (1,000 mg total) by mouth 3 (three) times daily with meals. 04/08/16  Yes Orvan Falconer, MD  multivitamin (RENA-VIT) TABS tablet Take 1 tablet by mouth daily.   Yes Historical Provider, MD  nitroGLYCERIN (NITROSTAT) 0.4 MG SL tablet Place 0.4 mg under the tongue every 5 (five) minutes as needed for chest pain.   Yes Historical Provider, MD  omeprazole (PRILOSEC) 20 MG capsule TAKE ONE CAPSULE BY MOUTH ONCE DAILY 04/15/16  Yes Butch Penny, NP  SENSIPAR 30 MG  tablet Take 30 mg by mouth daily with supper.  12/21/12  Yes Historical Provider, MD  simvastatin (ZOCOR) 20 MG tablet Take 1 tablet (20 mg total) by mouth at bedtime. 04/04/16  Yes Lendon Colonel, NP  epoetin alfa (EPOGEN,PROCRIT) 29518 UNIT/ML injection Inject 1 mL (10,000 Units total) into the vein every Monday, Wednesday, and Friday with hemodialysis. 04/11/16   Orvan Falconer, MD  fluticasone (FLONASE) 50 MCG/ACT nasal spray Place 2 sprays into the nose daily as needed for allergies.     Historical Provider, MD  nitroGLYCERIN (NITROSTAT) 0.4 MG SL tablet Place 1 tablet (0.4 mg total) under the tongue every 5 (five) minutes as needed for chest pain. 04/13/16 07/26/16  Herminio Commons, MD  PROAIR HFA 108 (90 BASE) MCG/ACT inhaler Inhale 1 puff into the lungs every 6 (six) hours as needed for wheezing or shortness of breath.  01/09/15   Historical Provider, MD     Family History    Problem Relation Age of Onset  . Heart disease Mother     Heart Disease before age 57  . Hyperlipidemia Mother   . Hypertension Mother   . Diabetes Mother   . Heart attack Mother   . Heart disease Father     Heart Disease before age 28  . Hyperlipidemia Father   . Hypertension Father   . Diabetes Father   . Diabetes Sister   . Hypertension Sister   . Diabetes Brother   . Hyperlipidemia Brother   . Heart attack Brother   . Hypertension Sister   . Heart attack Brother   . Other      noncontributory for early CAD  . Colon cancer Neg Hx   . Esophageal cancer Neg Hx   . Liver disease Neg Hx   . Kidney disease Neg Hx   . Colon polyps Neg Hx     Social History   Social History  . Marital status: Married    Spouse name: N/A  . Number of children: N/A  . Years of education: N/A   Social History Main Topics  . Smoking status: Never Smoker  . Smokeless tobacco: Never Used  . Alcohol use No  . Drug use: No  . Sexual activity: Yes    Birth control/ protection: Surgical   Other Topics Concern  . None   Social History Narrative   Lives in [place], [place] with husband.  Dialysis pt - [facility].     Review of Systems: A 12 point ROS discussed and pertinent positives are indicated in the HPI above.  All other systems are negative.  Review of Systems  Constitutional: Positive for activity change and fatigue. Negative for appetite change and fever.  Respiratory: Negative for cough and shortness of breath.   Gastrointestinal: Negative for abdominal pain.  Neurological: Positive for weakness.  Psychiatric/Behavioral: Negative for behavioral problems and confusion.    Vital Signs: BP (!) 144/66   Pulse 81   Temp 97.8 F (36.6 C) (Oral)   Resp 14   Ht 5' 2"  (1.575 m)   Wt 127 lb 9.6 oz (57.9 kg)   SpO2 97%   BMI 23.34 kg/m   Physical Exam  Constitutional: She is oriented to person, place, and time.  Cardiovascular: Normal rate and regular rhythm.   Pulmonary/Chest: Effort  normal and breath sounds normal.  Abdominal: Soft. Bowel sounds are normal.  Musculoskeletal: Normal range of motion.  Neurological: She is alert and oriented to person, place, and time.  Skin: Skin  is warm.  Right IJ temporary dialysis catheter in place  Psychiatric: She has a normal mood and affect. Her behavior is normal. Judgment and thought content normal.  Nursing note and vitals reviewed.   Mallampati Score:  MD Evaluation Airway: WNL Heart: WNL Abdomen: WNL Chest/ Lungs: WNL ASA  Classification: 3 Mallampati/Airway Score: One  Imaging: Dg Chest 2 View  Result Date: 07/27/2016 CLINICAL DATA:  76 y/o  F; headache and chills. EXAM: CHEST  2 VIEW COMPARISON:  07/16/2016 chest radiograph. FINDINGS: Stable cardiac silhouette within normal limits. Right central venous catheter tip projects over the right atrium. Post median sternotomy with wires and alignment. Aortic atherosclerosis with calcification. No acute osseous abnormality is evident. Small left pleural effusion is slightly increased. Pulmonary venous hypertension. No focal consolidation. Minor left basilar atelectasis. IMPRESSION: Small left pleural effusion and minor left basilar atelectasis. Pulmonary venous hypertension. No focal consolidation. Electronically Signed   By: Kristine Garbe M.D.   On: 07/27/2016 04:38   Dg Chest 2 View  Result Date: 07/16/2016 CLINICAL DATA:  Productive cough starting last night EXAM: CHEST  2 VIEW COMPARISON:  07/10/2016 FINDINGS: Mild cardiomegaly. Status post CABG with multiple coronary stents. Mild scarring at the right base. Aeration of the bases has improved since prior. There is no edema, consolidation, effusion, or pneumothorax. IMPRESSION: No evidence of acute disease. Electronically Signed   By: Monte Fantasia M.D.   On: 07/16/2016 14:15   Dg Chest 2 View  Result Date: 07/10/2016 CLINICAL DATA:  Shortness of breath. EXAM: CHEST  2 VIEW COMPARISON:  Radiograph of July 08, 2016. FINDINGS: Stable cardiomediastinal silhouette. Status post coronary artery bypass graft. Atherosclerosis of thoracic aorta is noted. No pneumothorax is noted. Stable bibasilar subsegmental atelectasis is noted with associated pleural effusions. Bony thorax is unremarkable. IMPRESSION: Stable bibasilar subsegmental atelectasis with associated pleural effusions. Aortic atherosclerosis. Electronically Signed   By: Marijo Conception, M.D.   On: 07/10/2016 08:34   Ct Head Wo Contrast  Result Date: 07/27/2016 CLINICAL DATA:  Headache. EXAM: CT HEAD WITHOUT CONTRAST TECHNIQUE: Contiguous axial images were obtained from the base of the skull through the vertex without intravenous contrast. COMPARISON:  CT 07/08/2016 FINDINGS: Brain: No evidence of acute infarction, hemorrhage, hydrocephalus, extra-axial collection or mass lesion/mass effect. Stable atrophy and chronic small vessel ischemia. Chronic lacunar infarct in the left external capsule. Vascular: Atherosclerosis of skullbase vasculature without hyperdense vessel or abnormal calcification. Skull: Normal. Negative for fracture or focal lesion. Sinuses/Orbits: Minimal mucosal thickening in left side of sphenoid sinus. Remaining sinuses are clear. Mastoid air cells are well-aerated. Other: None. IMPRESSION: No acute intracranial abnormality. Stable atrophy and chronic small vessel ischemia. Electronically Signed   By: Jeb Levering M.D.   On: 07/27/2016 04:40   Ct Head Wo Contrast  Result Date: 07/08/2016 CLINICAL DATA:  Elevated blood pressure after dialysis currently with headache. No known injury. EXAM: CT HEAD WITHOUT CONTRAST TECHNIQUE: Contiguous axial images were obtained from the base of the skull through the vertex without intravenous contrast. COMPARISON:  Head CTs from 05/19/2016 and 03/09/2016 FINDINGS: Brain: Chronic mild cerebral atrophy without ventricular dilatation. Chronic small vessel ischemic disease of the white matter noted  bilaterally with chronic left lacunar infarct in the left putamen/external capsule. No acute intracranial hemorrhage, midline shift or mass. No extra-axial fluid collection. No effacement of the basal cisterns. Vascular: Vascular calcifications in the intracranial carotid arteries and both vertebral arteries. Skull: No acute osseous abnormality. Sinuses/Orbits: Mucous retention cyst in the sphenoid.  Orbits are nonsuspicious mastoids appear clear. Other: None IMPRESSION: Chronic mild cerebral atrophy with small vessel ischemic disease of periventricular white matter. Chronic left putamenal/external capsule lacunar infarct. No acute intracranial abnormality noted. Electronically Signed   By: Ashley Royalty M.D.   On: 07/08/2016 21:14   Ir Fluoro Guide Cv Line Right  Result Date: 07/26/2016 INDICATION: End-stage renal disease, clotted AV fistula, hyperkalemia EXAM: NON TUNNELED CENTRAL VENOUS HEMODIALYSIS CATHETER WITH ULTRASOUND FLUOROSCOPIC GUIDANCE MEDICATIONS: 1% lidocaine locally ANESTHESIA/SEDATION: Moderate (conscious) sedation was employed during this procedure. A total of Versed 0.5 mg was administered intravenously. Moderate Sedation Time: 15 minutes. The patient's level of consciousness and vital signs were monitored continuously by radiology nursing throughout the procedure under my direct supervision. FLUOROSCOPY TIME:  Fluoroscopy Time: 0 minutes 24 seconds (1 mGy). COMPLICATIONS: None immediate. PROCEDURE: Informed written consent was obtained from the patient after a discussion of the risks, benefits, and alternatives to treatment. Questions regarding the procedure were encouraged and answered. The right neck and chest were prepped with chlorhexidine in a sterile fashion, and a sterile drape was applied covering the operative field. Maximum barrier sterile technique with sterile gowns and gloves were used for the procedure. A timeout was performed prior to the initiation of the procedure. After  creating a small venotomy incision, a micropuncture kit was utilized to access the right internal jugular vein under direct, real-time ultrasound guidance after the overlying soft tissues were anesthetized with 1% lidocaine with epinephrine. Ultrasound image documentation was performed. Micro guidewire advanced. Four [place] dilator inserted. Amplatz guidewire advanced into the IVC. Tract dilatation performed to insert a 20 cm temporary dialysis catheter. Tip position at the SVC RA junction. Catheter secured with a Prolene suture and a sterile dressing. Blood aspirated easily followed by saline and heparin flushes. External caps applied. Patient tolerated the procedure well. No immediate complication. IMPRESSION: Successful ultrasound and fluoroscopic insertion of a 20 cm temporary right IJ dialysis catheter. Tip SVC RA junction. Ready for use. Electronically Signed   By: Jerilynn Mages.  Shick M.D.   On: 07/26/2016 15:28   Ir US Guide Vasc Access Right  Result Date: 07/26/2016 INDICATION: End-stage renal disease, clotted AV fistula, hyperkalemia EXAM: NON TUNNELED CENTRAL VENOUS HEMODIALYSIS CATHETER WITH ULTRASOUND FLUOROSCOPIC GUIDANCE MEDICATIONS: 1% lidocaine locally ANESTHESIA/SEDATION: Moderate (conscious) sedation was employed during this procedure. A total of Versed 0.5 mg was administered intravenously. Moderate Sedation Time: 15 minutes. The patient's level of consciousness and vital signs were monitored continuously by radiology nursing throughout the procedure under my direct supervision. FLUOROSCOPY TIME:  Fluoroscopy Time: 0 minutes 24 seconds (1 mGy). COMPLICATIONS: None immediate. PROCEDURE: Informed written consent was obtained from the patient after a discussion of the risks, benefits, and alternatives to treatment. Questions regarding the procedure were encouraged and answered. The right neck and chest were prepped with chlorhexidine in a sterile fashion, and a sterile drape was applied covering the  operative field. Maximum barrier sterile technique with sterile gowns and gloves were used for the procedure. A timeout was performed prior to the initiation of the procedure. After creating a small venotomy incision, a micropuncture kit was utilized to access the right internal jugular vein under direct, real-time ultrasound guidance after the overlying soft tissues were anesthetized with 1% lidocaine with epinephrine. Ultrasound image documentation was performed. Micro guidewire advanced. Four [place] dilator inserted. Amplatz guidewire advanced into the IVC. Tract dilatation performed to insert a 20 cm temporary dialysis catheter. Tip position at the SVC RA junction. Catheter secured with a Prolene  suture and a sterile dressing. Blood aspirated easily followed by saline and heparin flushes. External caps applied. Patient tolerated the procedure well. No immediate complication. IMPRESSION: Successful ultrasound and fluoroscopic insertion of a 20 cm temporary right IJ dialysis catheter. Tip SVC RA junction. Ready for use. Electronically Signed   By: Jerilynn Mages.  Shick M.D.   On: 07/26/2016 15:28   Dg Chest Port 1 View  Result Date: 07/27/2016 CLINICAL DATA:  Evaluate catheter. EXAM: PORTABLE CHEST 1 VIEW COMPARISON:  Chest radiograph July 27, 2016 FINDINGS: Cardiac silhouette is mildly enlarged. Pulmonary vascular congestion. Mild calcific atherosclerosis aortic knob mild. Status post median sternotomy for CABG. Coronary artery stent. Stable appearance of RIGHT internal jugular central venous catheter with distal tip projecting in proximal atria. No pneumothorax. Bibasilar strandy densities. Blunting of the LEFT costophrenic angle. No consolidation. Soft tissue planes and included osseous structures are nonsuspicious. IMPRESSION: Stable RIGHT internal jugular central venous catheter distal tip projecting in RIGHT atrium, recommend 2 cm retraction. Bibasilar atelectasis and trace LEFT pleural effusion versus pleural  thickening. Mild cardiomegaly and pulmonary vascular congestion. Electronically Signed   By: Elon Alas M.D.   On: 07/27/2016 17:48   Dg Chest Port 1 View  Result Date: 07/09/2016 CLINICAL DATA:  Dyspnea and cough. EXAM: PORTABLE CHEST 1 VIEW COMPARISON:  06/15/2016 CXR FINDINGS: Patient is status post median sternotomy. There is stable cardiomegaly. The aorta is atherosclerotic without aneurysm. Bibasilar atelectasis right greater than left is noted. Slightly more confluent appearing airspace opacities at the right lung base cannot exclude pneumonia. No acute osseous appearing abnormality. IMPRESSION: Cardiomegaly with aortic atherosclerosis. Bibasilar atelectasis with findings suspicious for superimposed right lower lobe pneumonia. Electronically Signed   By: Ashley Royalty M.D.   On: 07/09/2016 00:51    Labs:  CBC:  Recent Labs  07/16/16 1517 07/26/16 1050  07/27/16 0355 07/27/16 0546 07/27/16 1640 07/28/16 0232  WBC 7.9 10.9*  --  11.7*  --  9.8  --   HGB 11.5* 10.4*  < > 8.6* 9.2* 8.1* 7.6*  HCT 37.1 33.0*  < > 27.2* 28.5* 25.8* 24.2*  PLT 292 366  --  303  --  258  --   < > = values in this interval not displayed.  COAGS:  Recent Labs  09/07/15 1700 01/27/16 1630 05/08/16 2351 07/26/16 1050  INR 1.01 1.02 0.92 1.02  APTT  --  34 27  --     BMP:  Recent Labs  07/09/16 1456  07/16/16 1517  07/26/16 1358 07/26/16 1539 07/26/16 1847 07/27/16 0355 07/27/16 1640  NA 133*  --  137  --  133*  133*  --  135 134* 137  K 3.8  < > 4.6  < > 7.5*  7.5* 6.4* 2.4* 4.3 4.6  CL 93*  --  98*  --   --   --  95* 92* 99*  CO2 27  --  23  --   --   --   --  26 26  GLUCOSE 165*  --  72  --  137*  137*  --  99 147* 97  BUN 33*  --  44*  --   --   --  23* 47* 53*  CALCIUM 8.6*  --  9.2  --   --   --   --  8.8* 8.1*  CREATININE 6.37*  --  7.82*  --   --   --  5.00* 7.99* 9.11*  GFRNONAA 6*  --  4*  --   --   --   --  4* 4*  GFRAA 7*  --  5*  --   --   --   --  5* 4*  < >  = values in this interval not displayed.  LIVER FUNCTION TESTS:  Recent Labs  05/12/16 0359  05/19/16 0423  07/09/16 0607 07/09/16 1456 07/16/16 1517 07/27/16 1640  BILITOT 1.1  --  0.9  --  0.5  --  0.8  --   AST 421*  --  36  --  23  --  17  --   ALT 529*  --  178*  --  13*  --  11*  --   ALKPHOS 92  --  75  --  70  --  77  --   PROT 6.5  --  6.4*  --  5.6*  --  7.1  --   ALBUMIN 3.4*  < > 2.8*  2.8*  < > 3.0* 2.9* 3.7 2.8*  < > = values in this interval not displayed.  TUMOR MARKERS: No results for input(s): AFPTM, CEA, CA199, CHROMGRNA in the last 8760 hours.  Assessment and Plan:  HTN ESRD PEA/CPR 05/11/2016 Clotted LUA AVF---temp cath in place Need for tunneled catheter per Renal MD Now scheduled for same Risks and Benefits discussed with the patient including, but not limited to bleeding, infection, vascular injury, pneumothorax which may require chest tube placement, air embolism or even death All of the patient's questions were answered, patient is agreeable to proceed. Consent signed and in chart.  Thank you for this interesting consult.  I greatly enjoyed meeting RAAHI KORBER and look forward to participating in their care.  A copy of this report was sent to the requesting provider on this date.  Electronically Signed: Dacy Enrico A 07/28/2016, 9:31 AM   I spent a total of 20 Minutes    in face to face in clinical consultation, greater than 50% of which was counseling/coordinating care for tunneled HD cath

## 2016-07-28 NOTE — Procedures (Signed)
Patient was seen on dialysis and the procedure was supervised.  BFR 350  Via vascath BP is  185/80.   Patient appears to be tolerating treatment well- did not get tunneled HD cath today, hopefully will happen tomorrow  Shawna [name] A 07/28/2016

## 2016-07-28 NOTE — Progress Notes (Signed)
Subjective:  No further CP. BPs better. Temp HD catheter in place . Trop elevated 2.5  Objective:  Temp:  [97.8 F (36.6 C)-98.4 F (36.9 C)] 97.8 F (36.6 C) (01/18 0751) Pulse Rate:  [74-82] 81 (01/18 0751) Resp:  [14-25] 14 (01/18 0751) BP: (120-172)/(51-79) 144/66 (01/18 0646) SpO2:  [96 %-100 %] 97 % (01/18 0751) Weight:  [127 lb 9.6 oz (57.9 kg)] 127 lb 9.6 oz (57.9 kg) (01/18 0413) Weight change: 9.6 oz (0.272 kg)  Intake/Output from previous day: 01/17 0701 - 01/18 0700 In: 877.6 [P.O.:360; I.V.:417.6; IV Piggyback:100] Out: -   Intake/Output from this shift: No intake/output data recorded.  Physical Exam: General appearance: alert and no distress Neck: no adenopathy, no carotid bruit, no JVD, supple, symmetrical, trachea midline and thyroid not enlarged, symmetric, no tenderness/mass/nodules Lungs: clear to auscultation bilaterally Heart: regular rate and rhythm, S1, S2 normal, no murmur, click, rub or gallop Extremities: extremities normal, atraumatic, no cyanosis or edema  Lab Results: Results for orders placed or performed during the hospital encounter of 07/27/16 (from the past 48 hour(s))  CBC with Differential     Status: Abnormal   Collection Time: 07/27/16  3:55 AM  Result Value Ref Range   WBC 11.7 (H) 4.0 - 10.5 K/uL   RBC 2.70 (L) 3.87 - 5.11 MIL/uL   Hemoglobin 8.6 (L) 12.0 - 15.0 g/dL   HCT 27.2 (L) 36.0 - 46.0 %   MCV 100.7 (H) 78.0 - 100.0 fL   MCH 31.9 26.0 - 34.0 pg   MCHC 31.6 30.0 - 36.0 g/dL   RDW 18.2 (H) 11.5 - 15.5 %   Platelets 303 150 - 400 K/uL   Neutrophils Relative % 87 %   Neutro Abs 10.2 (H) 1.7 - 7.7 K/uL   Lymphocytes Relative 4 %   Lymphs Abs 0.5 (L) 0.7 - 4.0 K/uL   Monocytes Relative 9 %   Monocytes Absolute 1.0 0.1 - 1.0 K/uL   Eosinophils Relative 0 %   Eosinophils Absolute 0.0 0.0 - 0.7 K/uL   Basophils Relative 0 %   Basophils Absolute 0.0 0.0 - 0.1 K/uL  Basic metabolic panel     Status: Abnormal   Collection Time: 07/27/16  3:55 AM  Result Value Ref Range   Sodium 134 (L) 135 - 145 mmol/L   Potassium 4.3 3.5 - 5.1 mmol/L   Chloride 92 (L) 101 - 111 mmol/L   CO2 26 22 - 32 mmol/L   Glucose, Bld 147 (H) 65 - 99 mg/dL   BUN 47 (H) 6 - 20 mg/dL   Creatinine, Ser 7.99 (H) 0.44 - 1.00 mg/dL   Calcium 8.8 (L) 8.9 - 10.3 mg/dL   GFR calc non Af Amer 4 (L) >60 mL/min   GFR calc Af Amer 5 (L) >60 mL/min    Comment: (NOTE) The eGFR has been calculated using the CKD EPI equation. This calculation has not been validated in all clinical situations. eGFR's persistently <60 mL/min signify possible Chronic Kidney Disease.    Anion gap 16 (H) 5 - 15  Troponin I     Status: Abnormal   Collection Time: 07/27/16  3:55 AM  Result Value Ref Range   Troponin I 1.52 (HH) <0.03 ng/mL    Comment: CRITICAL RESULT CALLED TO, READ BACK BY AND VERIFIED WITH: VICK,C AT 5:00AM ON 07/27/16 BY FESTERMAN,C   Hemoglobin and hematocrit, blood     Status: Abnormal   Collection Time: 07/27/16  5:46 AM  Result Value Ref Range   Hemoglobin 9.2 (L) 12.0 - 15.0 g/dL   HCT 28.5 (L) 36.0 - 46.0 %  POC occult blood, ED     Status: Abnormal   Collection Time: 07/27/16  6:20 AM  Result Value Ref Range   Fecal Occult Bld POSITIVE (A) NEGATIVE  MRSA PCR Screening     Status: None   Collection Time: 07/27/16  2:29 PM  Result Value Ref Range   MRSA by PCR NEGATIVE NEGATIVE    Comment:        The GeneXpert MRSA Assay (FDA approved for NASAL specimens only), is one component of a comprehensive MRSA colonization surveillance program. It is not intended to diagnose MRSA infection nor to guide or monitor treatment for MRSA infections.   Renal function panel     Status: Abnormal   Collection Time: 07/27/16  4:40 PM  Result Value Ref Range   Sodium 137 135 - 145 mmol/L   Potassium 4.6 3.5 - 5.1 mmol/L    Comment: DELTA CHECK NOTED   Chloride 99 (L) 101 - 111 mmol/L   CO2 26 22 - 32 mmol/L   Glucose, Bld 97 65 -  99 mg/dL   BUN 53 (H) 6 - 20 mg/dL   Creatinine, Ser 9.11 (H) 0.44 - 1.00 mg/dL    Comment: DELTA CHECK NOTED   Calcium 8.1 (L) 8.9 - 10.3 mg/dL   Phosphorus 6.7 (H) 2.5 - 4.6 mg/dL   Albumin 2.8 (L) 3.5 - 5.0 g/dL   GFR calc non Af Amer 4 (L) >60 mL/min   GFR calc Af Amer 4 (L) >60 mL/min    Comment: (NOTE) The eGFR has been calculated using the CKD EPI equation. This calculation has not been validated in all clinical situations. eGFR's persistently <60 mL/min signify possible Chronic Kidney Disease.    Anion gap 12 5 - 15  CBC     Status: Abnormal   Collection Time: 07/27/16  4:40 PM  Result Value Ref Range   WBC 9.8 4.0 - 10.5 K/uL   RBC 2.58 (L) 3.87 - 5.11 MIL/uL   Hemoglobin 8.1 (L) 12.0 - 15.0 g/dL    Comment: REPEATED TO VERIFY SPECIMEN CHECKED FOR CLOTS DELTA CHECK NOTED    HCT 25.8 (L) 36.0 - 46.0 %   MCV 100.0 78.0 - 100.0 fL   MCH 31.4 26.0 - 34.0 pg   MCHC 31.4 30.0 - 36.0 g/dL   RDW 18.5 (H) 11.5 - 15.5 %   Platelets 258 150 - 400 K/uL  Type and screen [place]     Status: None (Preliminary result)   Collection Time: 07/27/16  4:40 PM  Result Value Ref Range   ABO/RH(D) O POS    Antibody Screen NEG    Sample Expiration 07/30/2016    Unit Number [id number]    Blood Component Type RED CELLS,LR    Unit division 00    Status of Unit ALLOCATED    Transfusion Status OK TO TRANSFUSE    Crossmatch Result COMPATIBLE    Unit Number [id number]    Blood Component Type RED CELLS,LR    Unit division 00    Status of Unit ALLOCATED    Transfusion Status OK TO TRANSFUSE    Crossmatch Result COMPATIBLE   Hemoglobin and hematocrit, blood     Status: Abnormal   Collection Time: 07/28/16  2:32 AM  Result Value Ref Range   Hemoglobin 7.6 (L) 12.0 - 15.0 g/dL   HCT  24.2 (L) 36.0 - 46.0 %  Troponin I (q 6hr x 3)     Status: Abnormal   Collection Time: 07/28/16  2:32 AM  Result Value Ref Range   Troponin I 2.59 (HH) <0.03 ng/mL    Comment:  CRITICAL RESULT CALLED TO, READ BACK BY AND VERIFIED WITH: WOFFORD R,RN 07/28/16 0322 WAYK   Troponin I (q 6hr x 3)     Status: Abnormal   Collection Time: 07/28/16  8:17 AM  Result Value Ref Range   Troponin I 2.08 (HH) <0.03 ng/mL    Comment: CRITICAL VALUE NOTED.  VALUE IS CONSISTENT WITH PREVIOUSLY REPORTED AND CALLED VALUE.  CBC     Status: Abnormal   Collection Time: 07/28/16  8:17 AM  Result Value Ref Range   WBC 7.8 4.0 - 10.5 K/uL   RBC 2.36 (L) 3.87 - 5.11 MIL/uL   Hemoglobin 7.4 (L) 12.0 - 15.0 g/dL   HCT 23.8 (L) 36.0 - 46.0 %   MCV 100.8 (H) 78.0 - 100.0 fL   MCH 31.4 26.0 - 34.0 pg   MCHC 31.1 30.0 - 36.0 g/dL   RDW 18.1 (H) 11.5 - 15.5 %   Platelets 214 150 - 400 K/uL    Imaging: Imaging results have been reviewed Tele- NSR with occasional PVCs    Assessment/Plan:   1. Active Problems: 2.   HLD (hyperlipidemia) 3.   Mitral regurgitation 4.   Anemia of chronic kidney failure 5.   Acute blood loss anemia 6.   Chronic diastolic CHF (congestive heart failure) ([place]) 7.   Essential hypertension 8.   Acute on chronic diastolic CHF (congestive heart failure) ([place]) 9.   Hypertensive crisis 10.   Hypertensive emergency 11.   ACS (acute coronary syndrome) ([place]) 12.   Time Spent Directly with Patient:  20 minutes  Length of Stay:  LOS: 1 day   Trop increased to 2.5. BP better. Exam benign. Will check 2D and Lexiscan MV.  Quay Burow 07/28/2016, 10:11 AM

## 2016-07-28 NOTE — Progress Notes (Signed)
Myoview results not back-8:30 pm. I will keep NPO till this can be reviewed in am.  Kerin Ransom PA-C 07/28/2016 8:19 PM

## 2016-07-28 NOTE — Progress Notes (Signed)
The patient was seen in nuclear medicine for a Lexiscan myoview. She tolerated the procedure well. No acute ST or TW changes on ECG.   Rowyn Mustapha, NP  [facility]  

## 2016-07-28 NOTE — Progress Notes (Signed)
  Echocardiogram 2D Echocardiogram has been performed.  Shawna [name] 07/28/2016, 2:46 PM

## 2016-07-28 NOTE — Progress Notes (Signed)
PROGRESS NOTE    Shawna [name]   Shawna [name]  DOB: [birth year]  DOA: 07/27/2016 PCP: Shawna Percy, Shawna [name]   HPI 1/17:  Shawna [name] is an 76 y.o. female with hx of ESRD on HD, clotted AVF on her left upper arm, s/p tunnel catheter placement yesterday, receiving HD urgently yesterday for hyperkalemia, hx of severe HTN, hx of GI bleed, AVM, Carotid disease, HLD, chronic diastolic CHF, presented to the [facility] ER as she was found to be severely HTN at home with SBP 250 and had retrosternal Chest pain.  Hospital Course: noted to have elevated troponin and therefore transferred from [facility] to [facility]. Evaluated by Cardiology and Nephrology. Found to have Heme + stool and started on a Protonix infusion.   Subjective: No complaints, no chest pain or dyspnea.   Assessment & Plan:   Principal Problem:   Hypertensive emergency - may have been related to inability to be dialyzed on her usual day - BP improved- cont Norvasc,  Imdur, Metoprolol and Clonidine  Active Problems:   ACS (acute coronary syndrome) - likely secondary to HTN, fluid overload- cardiology following - 2 D ECHO and Lexiscan ordered  Hemoccult + stool EGD in 9/14 >> Non bleeding Duodenal AVM - has not noted recent bleeding  - will change Protonix infusion to BID protonix - due to acute MI, will not hold Aspirin - calling GI for eval- left message for Dr Benson [place]    Anemia of chronic kidney failure - heme + as well - check anemia panel - will need 1 u PRBC today as Hb 7.4 in setting of ACS  ESRD - with LUE fistula malfunction on Monday - temp cath placed on Tues to allow for dialysis - will have tunneled dialysis cath placed today by IR - further management per Nephro    Chronic diastolic CHF (congestive heart failure) (HCC) - grade 2 d CHF per ECHO in 3/17 - fluid management with dialysis     DVT prophylaxis: SCDs Code Status: Full code Family Communication: husband Disposition Plan: home when  stable Consultants:   cardiology Procedures:    Antimicrobials:  Anti-infectives    None       Objective: Vitals:   07/28/16 0445 07/28/16 0645 07/28/16 0646 07/28/16 0751  BP: 140/61 (!) 144/66 (!) 144/66   Pulse:    81  Resp: 14 15  14   Temp:    97.8 F (36.6 C)  TempSrc:    Oral  SpO2:    97%  Weight:      Height:        Intake/Output Summary (Last 24 hours) at 07/28/16 1117 Last data filed at 07/28/16 0900  Gross per 24 hour  Intake           772.09 ml  Output                0 ml  Net           772.09 ml   Filed Weights   07/27/16 0308 07/28/16 0413  Weight: 57.6 kg (127 lb) 57.9 kg (127 lb 9.6 oz)    Examination: General exam: Appears comfortable  HEENT: PERRLA, oral mucosa moist, no sclera icterus or thrush Respiratory system: Clear to auscultation. Respiratory effort normal. Cardiovascular system: S1 & S2 heard, RRR.  No murmurs  Gastrointestinal system: Abdomen soft, non-tender, nondistended. Normal bowel sound. No organomegaly Central nervous system: Alert and oriented. No focal neurological deficits. Extremities: No cyanosis, clubbing or edema  Skin: No rashes or ulcers Psychiatry:  Mood & affect appropriate.     Data Reviewed: I have personally reviewed following labs and imaging studies  CBC:  Recent Labs Lab 07/26/16 1050  07/27/16 0355 07/27/16 0546 07/27/16 1640 07/28/16 0232 07/28/16 0817  WBC 10.9*  --  11.7*  --  9.8  --  7.8  NEUTROABS 10.4*  --  10.2*  --   --   --   --   HGB 10.4*  < > 8.6* 9.2* 8.1* 7.6* 7.4*  HCT 33.0*  < > 27.2* 28.5* 25.8* 24.2* 23.8*  MCV 100.3*  --  100.7*  --  100.0  --  100.8*  PLT 366  --  303  --  258  --  214  < > = values in this interval not displayed. Basic Metabolic Panel:  Recent Labs Lab 07/26/16 1358 07/26/16 1539 07/26/16 1847 07/27/16 0355 07/27/16 1640  NA 133*  133*  --  135 134* 137  K 7.5*  7.5* 6.4* 2.4* 4.3 4.6  CL  --   --  95* 92* 99*  CO2  --   --   --  26 26   GLUCOSE 137*  137*  --  99 147* 97  BUN  --   --  23* 47* 53*  CREATININE  --   --  5.00* 7.99* 9.11*  CALCIUM  --   --   --  8.8* 8.1*  PHOS  --   --   --   --  6.7*   GFR: Estimated Creatinine Clearance: 4.2 mL/min (by C-G formula based on SCr of 9.11 mg/dL (H)). Liver Function Tests:  Recent Labs Lab 07/27/16 1640  ALBUMIN 2.8*   No results for input(s): LIPASE, AMYLASE in the last 168 hours. No results for input(s): AMMONIA in the last 168 hours. Coagulation Profile:  Recent Labs Lab 07/26/16 1050  INR 1.02   Cardiac Enzymes:  Recent Labs Lab 07/27/16 0355 07/28/16 0232 07/28/16 0817  TROPONINI 1.52* 2.59* 2.08*   BNP (last 3 results) No results for input(s): PROBNP in the last 8760 hours. HbA1C: No results for input(s): HGBA1C in the last 72 hours. CBG: No results for input(s): GLUCAP in the last 168 hours. Lipid Profile: No results for input(s): CHOL, HDL, LDLCALC, TRIG, CHOLHDL, LDLDIRECT in the last 72 hours. Thyroid Function Tests: No results for input(s): TSH, T4TOTAL, FREET4, T3FREE, THYROIDAB in the last 72 hours. Anemia Panel: No results for input(s): VITAMINB12, FOLATE, FERRITIN, TIBC, IRON, RETICCTPCT in the last 72 hours. Urine analysis:    Component Value Date/Time   COLORURINE YELLOW 12/16/2012 1919   APPEARANCEUR CLOUDY (A) 12/16/2012 1919   LABSPEC 1.009 12/16/2012 1919   PHURINE 7.5 12/16/2012 1919   GLUCOSEU NEGATIVE 12/16/2012 1919   HGBUR TRACE (A) 12/16/2012 1919   BILIRUBINUR NEGATIVE 12/16/2012 1919   KETONESUR NEGATIVE 12/16/2012 1919   PROTEINUR 100 (A) 12/16/2012 1919   UROBILINOGEN 0.2 12/16/2012 1919   NITRITE NEGATIVE 12/16/2012 1919   LEUKOCYTESUR SMALL (A) 12/16/2012 1919   Sepsis Labs: @LABRCNTIP (procalcitonin:4,lacticidven:4) ) Recent Results (from the past 240 hour(s))  MRSA PCR Screening     Status: None   Collection Time: 07/27/16  2:29 PM  Result Value Ref Range Status   MRSA by PCR NEGATIVE NEGATIVE Final     Comment:        The GeneXpert MRSA Assay (FDA approved for NASAL specimens only), is one component of a comprehensive MRSA colonization surveillance program. It is not  intended to diagnose MRSA infection nor to guide or monitor treatment for MRSA infections.          Radiology Studies: Dg Chest 2 View  Result Date: 07/27/2016 CLINICAL DATA:  76 y/o  F; headache and chills. EXAM: CHEST  2 VIEW COMPARISON:  07/16/2016 chest radiograph. FINDINGS: Stable cardiac silhouette within normal limits. Right central venous catheter tip projects over the right atrium. Post median sternotomy with wires and alignment. Aortic atherosclerosis with calcification. No acute osseous abnormality is evident. Small left pleural effusion is slightly increased. Pulmonary venous hypertension. No focal consolidation. Minor left basilar atelectasis. IMPRESSION: Small left pleural effusion and minor left basilar atelectasis. Pulmonary venous hypertension. No focal consolidation. Electronically Signed   By: Kristine Garbe M.D.   On: 07/27/2016 04:38   Ct Head Wo Contrast  Result Date: 07/27/2016 CLINICAL DATA:  Headache. EXAM: CT HEAD WITHOUT CONTRAST TECHNIQUE: Contiguous axial images were obtained from the base of the skull through the vertex without intravenous contrast. COMPARISON:  CT 07/08/2016 FINDINGS: Brain: No evidence of acute infarction, hemorrhage, hydrocephalus, extra-axial collection or mass lesion/mass effect. Stable atrophy and chronic small vessel ischemia. Chronic lacunar infarct in the left external capsule. Vascular: Atherosclerosis of skullbase vasculature without hyperdense vessel or abnormal calcification. Skull: Normal. Negative for fracture or focal lesion. Sinuses/Orbits: Minimal mucosal thickening in left side of sphenoid sinus. Remaining sinuses are clear. Mastoid air cells are well-aerated. Other: None. IMPRESSION: No acute intracranial abnormality. Stable atrophy and chronic  small vessel ischemia. Electronically Signed   By: Jeb Levering M.D.   On: 07/27/2016 04:40   Ir Fluoro Guide Cv Line Right  Result Date: 07/26/2016 INDICATION: End-stage renal disease, clotted AV fistula, hyperkalemia EXAM: NON TUNNELED CENTRAL VENOUS HEMODIALYSIS CATHETER WITH ULTRASOUND FLUOROSCOPIC GUIDANCE MEDICATIONS: 1% lidocaine locally ANESTHESIA/SEDATION: Moderate (conscious) sedation was employed during this procedure. A total of Versed 0.5 mg was administered intravenously. Moderate Sedation Time: 15 minutes. The patient's level of consciousness and vital signs were monitored continuously by radiology nursing throughout the procedure under my direct supervision. FLUOROSCOPY TIME:  Fluoroscopy Time: 0 minutes 24 seconds (1 mGy). COMPLICATIONS: None immediate. PROCEDURE: Informed written consent was obtained from the patient after a discussion of the risks, benefits, and alternatives to treatment. Questions regarding the procedure were encouraged and answered. The right neck and chest were prepped with chlorhexidine in a sterile fashion, and a sterile drape was applied covering the operative field. Maximum barrier sterile technique with sterile gowns and gloves were used for the procedure. A timeout was performed prior to the initiation of the procedure. After creating a small venotomy incision, a micropuncture kit was utilized to access the right internal jugular vein under direct, real-time ultrasound guidance after the overlying soft tissues were anesthetized with 1% lidocaine with epinephrine. Ultrasound image documentation was performed. Micro guidewire advanced. Four [place] dilator inserted. Amplatz guidewire advanced into the IVC. Tract dilatation performed to insert a 20 cm temporary dialysis catheter. Tip position at the SVC RA junction. Catheter secured with a Prolene suture and a sterile dressing. Blood aspirated easily followed by saline and heparin flushes. External caps applied.  Patient tolerated the procedure well. No immediate complication. IMPRESSION: Successful ultrasound and fluoroscopic insertion of a 20 cm temporary right IJ dialysis catheter. Tip SVC RA junction. Ready for use. Electronically Signed   By: Jerilynn Mages.  Shick M.D.   On: 07/26/2016 15:28   Ir US Guide Vasc Access Right  Result Date: 07/26/2016 INDICATION: End-stage renal disease, clotted AV fistula, hyperkalemia EXAM:  NON TUNNELED CENTRAL VENOUS HEMODIALYSIS CATHETER WITH ULTRASOUND FLUOROSCOPIC GUIDANCE MEDICATIONS: 1% lidocaine locally ANESTHESIA/SEDATION: Moderate (conscious) sedation was employed during this procedure. A total of Versed 0.5 mg was administered intravenously. Moderate Sedation Time: 15 minutes. The patient's level of consciousness and vital signs were monitored continuously by radiology nursing throughout the procedure under my direct supervision. FLUOROSCOPY TIME:  Fluoroscopy Time: 0 minutes 24 seconds (1 mGy). COMPLICATIONS: None immediate. PROCEDURE: Informed written consent was obtained from the patient after a discussion of the risks, benefits, and alternatives to treatment. Questions regarding the procedure were encouraged and answered. The right neck and chest were prepped with chlorhexidine in a sterile fashion, and a sterile drape was applied covering the operative field. Maximum barrier sterile technique with sterile gowns and gloves were used for the procedure. A timeout was performed prior to the initiation of the procedure. After creating a small venotomy incision, a micropuncture kit was utilized to access the right internal jugular vein under direct, real-time ultrasound guidance after the overlying soft tissues were anesthetized with 1% lidocaine with epinephrine. Ultrasound image documentation was performed. Micro guidewire advanced. Four [place] dilator inserted. Amplatz guidewire advanced into the IVC. Tract dilatation performed to insert a 20 cm temporary dialysis catheter. Tip  position at the SVC RA junction. Catheter secured with a Prolene suture and a sterile dressing. Blood aspirated easily followed by saline and heparin flushes. External caps applied. Patient tolerated the procedure well. No immediate complication. IMPRESSION: Successful ultrasound and fluoroscopic insertion of a 20 cm temporary right IJ dialysis catheter. Tip SVC RA junction. Ready for use. Electronically Signed   By: Jerilynn Mages.  Shick M.D.   On: 07/26/2016 15:28   Dg Chest Port 1 View  Result Date: 07/27/2016 CLINICAL DATA:  Evaluate catheter. EXAM: PORTABLE CHEST 1 VIEW COMPARISON:  Chest radiograph July 27, 2016 FINDINGS: Cardiac silhouette is mildly enlarged. Pulmonary vascular congestion. Mild calcific atherosclerosis aortic knob mild. Status post median sternotomy for CABG. Coronary artery stent. Stable appearance of RIGHT internal jugular central venous catheter with distal tip projecting in proximal atria. No pneumothorax. Bibasilar strandy densities. Blunting of the LEFT costophrenic angle. No consolidation. Soft tissue planes and included osseous structures are nonsuspicious. IMPRESSION: Stable RIGHT internal jugular central venous catheter distal tip projecting in RIGHT atrium, recommend 2 cm retraction. Bibasilar atelectasis and trace LEFT pleural effusion versus pleural thickening. Mild cardiomegaly and pulmonary vascular congestion. Electronically Signed   By: Elon Alas M.D.   On: 07/27/2016 17:48      Scheduled Meds: . acetaminophen  1,000 mg Oral Once  . amiodarone  200 mg Oral Daily  . amLODipine  2.5 mg Oral Daily  . aspirin EC  81 mg Oral Daily  . cinacalcet  30 mg Oral Q supper  . cloNIDine  0.2 mg Oral TID  . fluticasone  2 spray Each Nare Daily  . [START ON 07/29/2016] heparin  5,000 Units Subcutaneous Q8H  . hydrALAZINE  50 mg Oral Q8H  . isosorbide mononitrate  90 mg Oral Daily  . lanthanum  1,000 mg Oral TID WC  . metoprolol tartrate  12.5 mg Oral BID  . multivitamin   1 tablet Oral Daily  . [START ON 07/30/2016] pantoprazole  40 mg Intravenous Q12H  . simvastatin  20 mg Oral QHS  . sodium chloride flush  3 mL Intravenous Q12H   Continuous Infusions: . pantoprozole (PROTONIX) infusion 8 mg/hr (07/28/16 0455)     LOS: 1 day    Time spent in minutes: 35  Shawna Odea, Shawna [name] [facility] Pager: [email or web address] Password [email or web address] 07/28/2016, 11:17 AM

## 2016-07-29 ENCOUNTER — Encounter (HOSPITAL_COMMUNITY): Payer: Self-pay | Admitting: *Deleted

## 2016-07-29 ENCOUNTER — Inpatient Hospital Stay (HOSPITAL_COMMUNITY): Payer: Medicare HMO | Admitting: Certified Registered Nurse Anesthetist

## 2016-07-29 ENCOUNTER — Encounter (HOSPITAL_COMMUNITY)
Admission: EM | Disposition: A | Discharge status: Home Health | Payer: Self-pay | Source: Home / Self Care | Attending: Internal Medicine

## 2016-07-29 DIAGNOSIS — I5032 Chronic diastolic (congestive) heart failure: Secondary | ICD-10-CM | POA: Diagnosis not present

## 2016-07-29 DIAGNOSIS — I5033 Acute on chronic diastolic (congestive) heart failure: Secondary | ICD-10-CM | POA: Diagnosis not present

## 2016-07-29 DIAGNOSIS — I169 Hypertensive crisis, unspecified: Secondary | ICD-10-CM | POA: Diagnosis not present

## 2016-07-29 DIAGNOSIS — K5520 Angiodysplasia of colon without hemorrhage: Secondary | ICD-10-CM | POA: Diagnosis not present

## 2016-07-29 DIAGNOSIS — D649 Anemia, unspecified: Secondary | ICD-10-CM | POA: Diagnosis not present

## 2016-07-29 DIAGNOSIS — M1990 Unspecified osteoarthritis, unspecified site: Secondary | ICD-10-CM | POA: Diagnosis not present

## 2016-07-29 DIAGNOSIS — I340 Nonrheumatic mitral (valve) insufficiency: Secondary | ICD-10-CM

## 2016-07-29 DIAGNOSIS — D62 Acute posthemorrhagic anemia: Secondary | ICD-10-CM | POA: Diagnosis not present

## 2016-07-29 DIAGNOSIS — N186 End stage renal disease: Secondary | ICD-10-CM

## 2016-07-29 DIAGNOSIS — E875 Hyperkalemia: Secondary | ICD-10-CM | POA: Diagnosis not present

## 2016-07-29 DIAGNOSIS — K558 Other vascular disorders of intestine: Secondary | ICD-10-CM

## 2016-07-29 DIAGNOSIS — K5521 Angiodysplasia of colon with hemorrhage: Secondary | ICD-10-CM | POA: Diagnosis not present

## 2016-07-29 DIAGNOSIS — I161 Hypertensive emergency: Principal | ICD-10-CM

## 2016-07-29 DIAGNOSIS — I248 Other forms of acute ischemic heart disease: Secondary | ICD-10-CM | POA: Diagnosis not present

## 2016-07-29 DIAGNOSIS — T82818A Embolism of vascular prosthetic devices, implants and grafts, initial encounter: Secondary | ICD-10-CM | POA: Diagnosis not present

## 2016-07-29 DIAGNOSIS — I214 Non-ST elevation (NSTEMI) myocardial infarction: Secondary | ICD-10-CM | POA: Diagnosis not present

## 2016-07-29 DIAGNOSIS — E7800 Pure hypercholesterolemia, unspecified: Secondary | ICD-10-CM

## 2016-07-29 DIAGNOSIS — D631 Anemia in chronic kidney disease: Secondary | ICD-10-CM | POA: Diagnosis not present

## 2016-07-29 DIAGNOSIS — R748 Abnormal levels of other serum enzymes: Secondary | ICD-10-CM

## 2016-07-29 DIAGNOSIS — I249 Acute ischemic heart disease, unspecified: Secondary | ICD-10-CM | POA: Diagnosis not present

## 2016-07-29 DIAGNOSIS — D1330 Benign neoplasm of unspecified part of small intestine: Secondary | ICD-10-CM | POA: Diagnosis not present

## 2016-07-29 DIAGNOSIS — I10 Essential (primary) hypertension: Secondary | ICD-10-CM | POA: Diagnosis not present

## 2016-07-29 HISTORY — PX: ESOPHAGOGASTRODUODENOSCOPY: SHX5428

## 2016-07-29 LAB — BASIC METABOLIC PANEL
ANION GAP: 9 (ref 5–15)
BUN: 15 mg/dL (ref 6–20)
CALCIUM: 7.8 mg/dL — AB (ref 8.9–10.3)
CO2: 30 mmol/L (ref 22–32)
Chloride: 99 mmol/L — ABNORMAL LOW (ref 101–111)
Creatinine, Ser: 4.28 mg/dL — ABNORMAL HIGH (ref 0.44–1.00)
GFR calc Af Amer: 11 mL/min — ABNORMAL LOW (ref >60)
GFR, EST NON AFRICAN AMERICAN: 9 mL/min — AB (ref >60)
Glucose, Bld: 125 mg/dL — ABNORMAL HIGH (ref 65–99)
POTASSIUM: 3.7 mmol/L (ref 3.5–5.1)
SODIUM: 138 mmol/L (ref 135–145)

## 2016-07-29 LAB — NM MYOCAR MULTI W/SPECT W/WALL MOTION / EF
CHL CUP RESTING HR STRESS: 92 {beats}/min
CSEPHR: 84 %
CSEPPHR: 92 {beats}/min
Estimated workload: 1 METS
MPHR: 144 {beats}/min

## 2016-07-29 LAB — HEMOGLOBIN AND HEMATOCRIT, BLOOD
HCT: 30.6 % — ABNORMAL LOW (ref 36.0–46.0)
HCT: 30.9 % — ABNORMAL LOW (ref 36.0–46.0)
HEMATOCRIT: 34 % — AB (ref 36.0–46.0)
HEMOGLOBIN: 9.8 g/dL — AB (ref 12.0–15.0)
Hemoglobin: 11 g/dL — ABNORMAL LOW (ref 12.0–15.0)
Hemoglobin: 10 g/dL — ABNORMAL LOW (ref 12.0–15.0)

## 2016-07-29 LAB — CBC
HCT: 30.2 % — ABNORMAL LOW (ref 36.0–46.0)
Hemoglobin: 9.8 g/dL — ABNORMAL LOW (ref 12.0–15.0)
MCH: 31.1 pg (ref 26.0–34.0)
MCHC: 32.5 g/dL (ref 30.0–36.0)
MCV: 95.9 fL (ref 78.0–100.0)
PLATELETS: 238 10*3/uL (ref 150–400)
RBC: 3.15 MIL/uL — AB (ref 3.87–5.11)
RDW: 19.1 % — AB (ref 11.5–15.5)
WBC: 6.7 10*3/uL (ref 4.0–10.5)

## 2016-07-29 SURGERY — ENTEROSCOPY
Anesthesia: Monitor Anesthesia Care

## 2016-07-29 SURGERY — EGD (ESOPHAGOGASTRODUODENOSCOPY)
Anesthesia: General

## 2016-07-29 MED ORDER — PROPOFOL 10 MG/ML IV BOLUS
INTRAVENOUS | Status: DC | PRN
  Administered 2016-07-29 (×2): 20 mg via INTRAVENOUS

## 2016-07-29 MED ORDER — PROPOFOL 500 MG/50ML IV EMUL
INTRAVENOUS | Status: DC | PRN
  Administered 2016-07-29: 75 ug/kg/min via INTRAVENOUS

## 2016-07-29 MED ORDER — SODIUM CHLORIDE 0.9 % IV SOLN
INTRAVENOUS | Status: DC | PRN
  Administered 2016-07-29: 11:00:00 via INTRAVENOUS

## 2016-07-29 MED ORDER — BUTAMBEN-TETRACAINE-BENZOCAINE 2-2-14 % EX AERO
INHALATION_SPRAY | CUTANEOUS | Status: DC | PRN
  Administered 2016-07-29: 2 via TOPICAL

## 2016-07-29 MED ORDER — VANCOMYCIN HCL IN DEXTROSE 1-5 GM/200ML-% IV SOLN: 1000.0000 mg | INTRAVENOUS | Status: DC

## 2016-07-29 NOTE — Progress Notes (Signed)
Progress Note  Patient Name: Shawna [name] Date of Encounter: 07/29/2016  Primary Cardiologist: Dr. Bronson Ing  Subjective   Feels well, denies chest pain and SOB.   Inpatient Medications    Scheduled Meds: . sodium chloride   Intravenous Once  . acetaminophen  1,000 mg Oral Once  . amiodarone  200 mg Oral Daily  . amLODipine  2.5 mg Oral Daily  . aspirin EC  81 mg Oral Daily  . cinacalcet  30 mg Oral Q supper  . cloNIDine  0.2 mg Oral TID  . fluticasone  2 spray Each Nare Daily  . heparin  5,000 Units Subcutaneous Q8H  . hydrALAZINE  50 mg Oral Q8H  . isosorbide mononitrate  90 mg Oral Daily  . lanthanum  1,000 mg Oral TID WC  . metoprolol tartrate  12.5 mg Oral BID  . multivitamin  1 tablet Oral Daily  . [START ON 07/30/2016] pantoprazole  40 mg Intravenous Q12H  . pantoprazole (PROTONIX) IV  40 mg Intravenous Q12H  . simvastatin  20 mg Oral QHS  . sodium chloride flush  3 mL Intravenous Q12H  . vancomycin  1,000 mg Intravenous to XRAY   Continuous Infusions:  PRN Meds: sodium chloride, sodium chloride, albuterol, ALPRAZolam, alteplase, heparin, lidocaine (PF), lidocaine-prilocaine, menthol-cetylpyridinium, pentafluoroprop-tetrafluoroeth, sodium chloride flush   Vital Signs    Vitals:   07/28/16 2200 07/28/16 2315 07/29/16 0013 07/29/16 0413  BP:  (!) 185/83 (!) 173/80 (!) 146/61  Pulse: 88 81 80 74  Resp: 20 19 (!) 21 20  Temp:   99.2 F (37.3 C) 99.3 F (37.4 C)  TempSrc:   Oral Oral  SpO2: 98% 97% 98% 97%  Weight:    122 lb 9.6 oz (55.6 kg)  Height:        Intake/Output Summary (Last 24 hours) at 07/29/16 0830 Last data filed at 07/28/16 1742  Gross per 24 hour  Intake              535 ml  Output                0 ml  Net              535 ml   Filed Weights   07/27/16 0308 07/28/16 0413 07/29/16 0413  Weight: 127 lb (57.6 kg) 127 lb 9.6 oz (57.9 kg) 122 lb 9.6 oz (55.6 kg)    Telemetry    NSR- Personally Reviewed  ECG    NSR - Personally  Reviewed  Physical Exam   GEN: No acute distress.  Neck: No JVD  Cardiac: RRR, no murmurs, rubs, or gallops.  Radials/DP/PT 2+ and equal bilaterally.  Respiratory:  Clear to auscultation bilaterally. GI: Soft, nontender, non-distended  MS: no deformity; no edema Neuro:  Alert and oriented x 3  Labs    Chemistry Recent Labs Lab 07/27/16 1640 07/28/16 1525 07/29/16 0239  NA 137 135 138  K 4.6 4.5 3.7  CL 99* 97* 99*  CO2 26 23 30   GLUCOSE 97 112* 125*  BUN 53* 64* 15  CREATININE 9.11* 10.68* 4.28*  CALCIUM 8.1* 7.3* 7.8*  ALBUMIN 2.8* 2.8*  --   GFRNONAA 4* 3* 9*  GFRAA 4* 4* 11*  ANIONGAP 12 15 9      Hematology Recent Labs Lab 07/27/16 1640  07/28/16 0817 07/28/16 1411 07/28/16 2037 07/29/16 0239  WBC 9.8  --  7.8  --   --  6.7  RBC 2.58*  --  2.36*  2.63*  --  3.15*  HGB 8.1*  < > 7.4* 8.3* 11.1* 9.8*  HCT 25.8*  < > 23.8* 26.4* 34.2* 30.2*  MCV 100.0  --  100.8*  --   --  95.9  MCH 31.4  --  31.4  --   --  31.1  MCHC 31.4  --  31.1  --   --  32.5  RDW 18.5*  --  18.1*  --   --  19.1*  PLT 258  --  214  --   --  238  < > = values in this interval not displayed.  Cardiac Enzymes Recent Labs Lab 07/27/16 0355 07/28/16 0232 07/28/16 0817 07/28/16 1411  TROPONINI 1.52* 2.59* 2.08* 1.14*   No results for input(s): TROPIPOC in the last 168 hours.     Radiology    Nm Myocar Multi W/spect W/wall Motion / Ef  Result Date: 07/29/2016 CLINICAL DATA:  Chest pain EXAM: MYOCARDIAL IMAGING WITH SPECT (REST AND PHARMACOLOGIC-STRESS) GATED LEFT VENTRICULAR WALL MOTION STUDY LEFT VENTRICULAR EJECTION FRACTION TECHNIQUE: Standard myocardial SPECT imaging was performed after resting intravenous injection of 10 mCi Tc-22m tetrofosmin. Subsequently, intravenous infusion of Lexiscan was performed under the supervision of the Cardiology staff. At peak effect of the drug, 30 mCi Tc-47m tetrofosmin was injected intravenously and standard myocardial SPECT imaging was  performed. Quantitative gated imaging was also performed to evaluate left ventricular wall motion, and estimate left ventricular ejection fraction. COMPARISON:  None. FINDINGS: Perfusion: There is a moderate-size the defect in the anterior wall adjacent to the apex and extending into the anterior apex. It is primarily fixed. There is a small area of reversibility towards the base. Wall Motion: Global hypokinesis. Left Ventricular Ejection Fraction: 41 % End diastolic volume 448 ml End systolic volume 64 ml IMPRESSION: 1. There is a primarily fixed defect in the anterior wall and apex which is moderate in size. A small portion of it towards the base is reversible. 2. Global hypokinesis. 3. Left ventricular ejection fraction 41% 4. Non invasive risk stratification*: Intermediate risk. *2012 Appropriate Use Criteria for Coronary Revascularization Focused Update: J Am Coll Cardiol. 1856;31(4):970-263. http://content.airportbarriers.com.aspx?articleid=[email or web address] Electronically Signed   By: Marybelle Killings M.D.   On: 07/29/2016 08:19   Dg Chest Port 1 View  Result Date: 07/27/2016 CLINICAL DATA:  Evaluate catheter. EXAM: PORTABLE CHEST 1 VIEW COMPARISON:  Chest radiograph July 27, 2016 FINDINGS: Cardiac silhouette is mildly enlarged. Pulmonary vascular congestion. Mild calcific atherosclerosis aortic knob mild. Status post median sternotomy for CABG. Coronary artery stent. Stable appearance of RIGHT internal jugular central venous catheter with distal tip projecting in proximal atria. No pneumothorax. Bibasilar strandy densities. Blunting of the LEFT costophrenic angle. No consolidation. Soft tissue planes and included osseous structures are nonsuspicious. IMPRESSION: Stable RIGHT internal jugular central venous catheter distal tip projecting in RIGHT atrium, recommend 2 cm retraction. Bibasilar atelectasis and trace LEFT pleural effusion versus pleural thickening. Mild cardiomegaly and pulmonary vascular congestion.  Electronically Signed   By: Elon Alas M.D.   On: 07/27/2016 17:48    Cardiac Studies   Transthoracic Echocardiography 07/28/16 Study Conclusions  - Left ventricle: Septal and apical hypokinesis. The cavity size   was normal. Wall thickness was normal. Systolic function was   normal. The estimated ejection fraction was in the range of 50%   to 55%. Doppler parameters are consistent with both elevated   ventricular end-diastolic filling pressure and elevated left   atrial filling pressure. - Mitral valve: There was mild regurgitation.  Valve area by   continuity equation (using LVOT flow): 1.49 cm^2. - Left atrium: The atrium was moderately dilated. - Atrial septum: No defect or patent foramen ovale was identified. - Pulmonary arteries: PA peak pressure: 54 mm Hg (S).   Patient Profile     76 y.o. female with past medical history of CAD with previous stents and CABG in 2013 presented with chest pain and hypertensive urgency.   Assessment & Plan    1. NSTEMI: Patient sent for lexiscan myoview yesterday, has resulted intermediate risk with fixed defect in the anterior wall and a small portion of reversibility in the apex. MD to advise on cath. Likely that there is not a large area of ischemia to fix as she has an extensive history of CAD with a CABG in 2013, last cardiac catheterization performed on 12/10/2013 showed a patent left main, focal disease in the mid to distal LAD, patent diagonal, patent LIMA to LAD, severe stenosis in proximal left circumflex, patent SVG to left circumflex, 70% proximal ramus stenosis, patent SVG to ramus, occluded stent to ostium of RCA and finely patent SVG to distal RCA.  She is in need of tunneled HD catheter placement. She has a functioning right IJ HD catheter currently.   2. HTN: Still hypertensive. Would increase amlodipine to 5mg  and likely ultimately 10mg .     Signed, Arbutus Leas, NP  07/29/2016, 8:30 AM    Agree with note by [name] NP  Results of 2D and MV noted. EF 55% by 2D (41% by gated SPECT) There is an area of antero- apical scar with superimposed ischemia. Given CP, + trop and need for HD catheter, should prob have cardiac cath to risk stratify ( last cath 12/10/13). Given schedule today will arrange for Monday. Exam benign.   Lorretta Harp, M.D., [place], [facility], [place] [address] [place], [place]  [id number]  [phone] 07/29/2016 9:26 AM

## 2016-07-29 NOTE — Progress Notes (Signed)
GI UPDATE:  Patient underwent push enteroscopy this morning. Formal note to follow (computer system down at present). She had a AVM noted in the proximal jejunum which was ablated, and an incidentally noted small bowel polyp which was not removed during this exam.   I suspect the AVM could be related to her anemia and was treated. Okay for clear liquid diet for now and monitor Hgb, we will follow.   [place] Cellar, MD [facility] Gastroenterology Pager [phone]

## 2016-07-29 NOTE — H&P (View-Only) (Signed)
Referring Provider: [facility] -Dr. Reggy Eye Primary Care Physician:  [name], MD Primary Gastroenterologist: Lucio Edward, MD  Reason for Consultation:  Anemia, FOBT positive  HPI: Shawna [name] is a 76 y.o. female with multiple medical problems including ESRD on HD. She has a history of GERD, iron deficiency anemia, small bowel AVMs and adenomatous colon polyps.  She is due now for surveillance colonoscopy. We last saw patient in the office in 2016. She has since been followed by GI in [place]. She had an EGD late Oct 2016 with findings of erosive gastroduodenitis.   Patient admitted yesterday with chest pain. She had been significantly hypertensive at home with SBP of 250. EKG showed non-specific ST depression over precordial leads (unchanged from previous), troponin elevated at 1.5. She was found to be anemic with brown, heme positive stools. The pain radiated through to her back. No associated dyspnea or nausea. No chest pain today. Patient gets occasional lower abdominal discomfort and she has occasional constipation and diarrhea but bowel habits are unchanged. She thinks her weight is stable.    Past Medical History:  Diagnosis Date  . Anxiety   . Arthritis   . AVM (arteriovenous malformation) of colon   . CAD (coronary artery disease)    a. 12/2011 NSTEMI/Cath/PCI LCX (2.25x14 Resolute DES) & D1 (2.25x22 Resolute DES);  b. 01/2012 Cath/PCI: LM 30, LAD 30p, 40-50m D1 stent ok, 99 in sm branch of diag, LCX patent stent, OM1 20, RCA 95 ost (4.0x12 Promus DES), EF 55%;  c. 04/2012 Lexi Cardiolite  EF 48%, small area of scar @ base/mid inflat wall with mild peri-infarct ischemia.; CABG 12/4  . Carotid artery disease ([place]    a. 664-40%LICA, 93/4742  . Chronic bronchitis ([place]   . Chronic diastolic CHF (congestive heart failure) ([place]    a. 02/2012 Echo EF 60-65%, nl wall motion, Gr 1 DD, mod MR  . Colon cancer [place] 1992  . Esophageal stricture   . [place]D [place]ialysis  ([place]    ESRD due to HTN, [facility] 2011 [facility]  Access is LUA AVF as of Sept 2014.   .Marland KitchenGERD (gastroesophageal reflux disease)   . High cholesterol 12/2011  . History of blood transfusion 07/2011; 12/2011; 01/2012 X 2; 04/2012  . History of gout   . History of lower GI bleeding   . Hypertension   . Iron deficiency anemia   . Mitral regurgitation    a. Moderate by echo, 02/2012  . Myocardial infarction   . [place]varian cancer ([place] 1992  . Pneumonia ~ 2009  . PUD (peptic ulcer disease)   . TIA (transient ischemic attack)     Past Surgical History:  Procedure Laterality Date  . ABDOMINAL HYSTERECTOMY  1992  . APPENDECTOMY  06/1990  . AV FISTULA PLACEMENT  07/2009   left upper arm  . COLON RESECTION  1992  . CORONARY ANGIOPLASTY WITH STENT PLACEMENT  12/15/11   "2"  . CORONARY ANGIOPLASTY WITH STENT PLACEMENT  y/2013   "1; makes total of 3" (05/02/2012)  . CORONARY ARTERY BYPASS GRAFT  06/13/2012   Procedure: CORONARY ARTERY BYPASS GRAFTING (CABG);  Surgeon: EGrace Isaac MD;  [place]  Service: Open Heart Surgery;  Laterality: N/A;  cabg x four;  using left internal mammary artery, and left leg greater saphenous vein harvested endoscopically  . DILATION AND CURETTAGE OF UTERUS    .  ESOPHAGOGASTRODUODENOSCOPY  01/20/2012   Procedure: ESOPHAGOGASTRODUODENOSCOPY (EGD);  Surgeon: Ladene Artist, MD,FACG;  Location[facility] ENDOSCOPY;  Service: Endoscopy;  Laterality: N/A;  . ESOPHAGOGASTRODUODENOSCOPY N/A 03/26/2013   Procedure: ESOPHAGOGASTRODUODENOSCOPY (EGD);  Surgeon: Irene Shipper, MD;  Location[facility] ENDOSCOPY;  Service: Endoscopy;  Laterality: N/A;  . ESOPHAGOGASTRODUODENOSCOPY N/A 04/30/2015   Procedure: ESOPHAGOGASTRODUODENOSCOPY (EGD);  Surgeon: Rogene Houston, MD;  [facility];  Service: Endoscopy;  Laterality: N/A;  1pm - moved to 10/20 @ 1:10  . INTRAOPERATIVE TRANSESOPHAGEAL ECHOCARDIOGRAM  06/13/2012    Procedure: INTRAOPERATIVE TRANSESOPHAGEAL ECHOCARDIOGRAM;  Surgeon: Grace Isaac, MD;  Location: [place];  Service: Open Heart Surgery;  Laterality: N/A;  . IR GENERIC HISTORICAL  07/26/2016   IR FLUORO GUIDE CV LINE RIGHT 07/26/2016 Greggory Keen, MD [facility]-INTERV RAD  . IR GENERIC HISTORICAL  07/26/2016   IR US GUIDE VASC ACCESS RIGHT 07/26/2016 Greggory Keen, MD [facility]  . LEFT HEART CATHETERIZATION WITH CORONARY ANGIOGRAM N/A 12/15/2011   Procedure: LEFT HEART CATHETERIZATION WITH CORONARY ANGIOGRAM;  Surgeon: Burnell Blanks, MD;  [facility];  Service: Cardiovascular;  Laterality: N/A;  . LEFT HEART CATHETERIZATION WITH CORONARY ANGIOGRAM N/A 01/10/2012   Procedure: LEFT HEART CATHETERIZATION WITH CORONARY ANGIOGRAM;  Surgeon: Peter M Martinique, MD;  Location: [facility] LAB;  Service: Cardiovascular;  Laterality: N/A;  . LEFT HEART CATHETERIZATION WITH CORONARY ANGIOGRAM N/A 06/08/2012   Procedure: LEFT HEART CATHETERIZATION WITH CORONARY ANGIOGRAM;  Surgeon: Burnell Blanks, MD;  Location: [facility] CATH LAB;  Service: Cardiovascular;  Laterality: N/A;  . LEFT HEART CATHETERIZATION WITH CORONARY/GRAFT ANGIOGRAM N/A 12/10/2013   Procedure: LEFT HEART CATHETERIZATION WITH Beatrix Fetters;  Surgeon: [name], MD;  Location: [facility] C[facility] LAB;  Service: Cardiovascular;  Laterality: N/A;  . OVARY SURGERY     ovarian cancer  . SHUNTOGRAM N/A 10/15/2013   Procedure: Fistulogram;  Surgeon: Serafina Mitchell, MD;  Location: [facility] CATH LAB;  Service: Cardiovascular;  Laterality: N/A;  . THROMBECTOMY / ARTERIOVENOUS GRAFT REVISION  2011   left upper arm  . TUBAL LIGATION  1980's    Prior to Admission medications   Medication Sig Start Date End Date Taking? Authorizing Provider  ALPRAZolam (XANAX) 0.25 MG tablet Take 0.25-0.5 mg by mouth at bedtime as needed for sleep.  11/19/15  Yes Historical Provider, MD  amiodarone (PACERONE) 200 MG tablet Take 1 tablet (200 mg total) by mouth  daily. 09/23/15  Yes Herminio Commons, MD  amLODipine (NORVASC) 2.5 MG tablet Take 2.5 mg by mouth daily.   Yes Historical Provider, MD  aspirin EC 81 MG tablet Take 81 mg by mouth daily.    Yes Historical Provider, MD  cloNIDine (CATAPRES) 0.2 MG tablet Take 1 tablet (0.2 mg total) by mouth 3 (three) times daily. 07/10/16  Yes Thurnell Lose, MD  hydrALAZINE (APRESOLINE) 50 MG tablet Take 1 tablet (50 mg total) by mouth every 8 (eight) hours. 07/10/16  Yes Thurnell Lose, MD  isosorbide mononitrate (IMDUR) 60 MG 24 hr tablet Take 1 tablet (60 mg total) by mouth daily. 07/10/16  Yes Thurnell Lose, MD  lanthanum (FOSRENOL) 1000 MG chewable tablet Chew 1 tablet (1,000 mg total) by mouth 3 (three) times daily with meals. 04/08/16  Yes Orvan Falconer, MD  multivitamin (RENA-VIT) TABS tablet Take 1 tablet by mouth daily.   Yes Historical Provider, MD  nitroGLYCERIN (NITROSTAT) 0.4 MG SL tablet Place 0.4 mg under the tongue every 5 (five) minutes as needed for  chest pain.   Yes Historical Provider, MD  omeprazole (PRILOSEC) 20 MG capsule TAKE ONE CAPSULE BY MOUTH ONCE DAILY 04/15/16  Yes Butch Penny, NP  SENSIPAR 30 MG tablet Take 30 mg by mouth daily with supper.  12/21/12  Yes Historical Provider, MD  simvastatin (ZOCOR) 20 MG tablet Take 1 tablet (20 mg total) by mouth at bedtime. 04/04/16  Yes Lendon Colonel, NP  epoetin alfa (EPOGEN,PROCRIT) 33354 UNIT/ML injection Inject 1 mL (10,000 Units total) into the vein every Monday, Wednesday, and Friday with hemodialysis. 04/11/16   Orvan Falconer, MD  fluticasone (FLONASE) 50 MCG/ACT nasal spray Place 2 sprays into the nose daily as needed for allergies.     Historical Provider, MD  nitroGLYCERIN (NITROSTAT) 0.4 MG SL tablet Place 1 tablet (0.4 mg total) under the tongue every 5 (five) minutes as needed for chest pain. 04/13/16 07/26/16  Herminio Commons, MD  PROAIR HFA 108 (90 BASE) MCG/ACT inhaler Inhale 1 puff into the lungs every 6 (six) hours as needed  for wheezing or shortness of breath.  01/09/15   Historical Provider, MD    Current Facility-Administered Medications  Medication Dose Route Frequency Provider Last Rate Last Dose  . 0.9 %  sodium chloride infusion  100 mL Intravenous PRN [name], MD      . 0.9 %  sodium chloride infusion  100 mL Intravenous PRN [name], MD      . 0.9 %  sodium chloride infusion   Intravenous Once Debbe Odea, MD      . acetaminophen (TYLENOL) tablet 1,000 mg  1,000 mg Oral Once Merryl Hacker, MD      . albuterol (PROVENTIL) (2.5 MG/3ML) 0.083% nebulizer solution 3 mL  3 mL Inhalation Q6H PRN Orvan Falconer, MD      . ALPRAZolam Duanne Moron) tablet 0.25-0.5 mg  0.25-0.5 mg Oral QHS PRN Orvan Falconer, MD      . alteplase (CATHFLO ACTIVASE) injection 2 mg  2 mg Intracatheter Once PRN [name], MD      . amiodarone (PACERONE) tablet 200 mg  200 mg Oral Daily Orvan Falconer, MD   200 mg at 07/27/16 1437  . amLODipine (NORVASC) tablet 2.5 mg  2.5 mg Oral Daily Lorretta Harp, MD   2.5 mg at 07/27/16 1217  . aspirin EC tablet 81 mg  81 mg Oral Daily Orvan Falconer, MD   81 mg at 07/28/16 1347  . cinacalcet (SENSIPAR) tablet 30 mg  30 mg Oral Q supper Orvan Falconer, MD   30 mg at 07/27/16 1901  . cloNIDine (CATAPRES) tablet 0.2 mg  0.2 mg Oral TID Orvan Falconer, MD   0.2 mg at 07/27/16 2200  . fluticasone (FLONASE) 50 MCG/ACT nasal spray 2 spray  2 spray Each Nare Daily Orvan Falconer, MD      . heparin injection 1,000 Units  1,000 Units Dialysis PRN [name], MD      . Derrill Memo ON 07/29/2016] heparin injection 5,000 Units  5,000 Units Subcutaneous Q8H Monia Sabal, PA-C      . hydrALAZINE (APRESOLINE) tablet 50 mg  50 mg Oral Q8H Waldemar Dickens, MD   50 mg at 07/28/16 0646  . isosorbide mononitrate (IMDUR) 24 hr tablet 90 mg  90 mg Oral Daily Almyra Deforest, [place]   90 mg at 07/28/16 1347  . lanthanum (FOSRENOL) chewable tablet 1,000 mg  1,000 mg Oral TID WC Orvan Falconer, MD   1,000 mg at 07/28/16 1347  . lidocaine (  PF) (XYLOCAINE) 1 %  injection 5 mL  5 mL Intradermal PRN [name], MD      . lidocaine-prilocaine (EMLA) cream 1 application  1 application Topical PRN [name], MD      . menthol-cetylpyridinium (CEPACOL) lozenge 3 mg  1 lozenge Oral PRN Waldemar Dickens, MD   3 mg at 07/27/16 1902  . metoprolol tartrate (LOPRESSOR) tablet 12.5 mg  12.5 mg Oral BID Almyra Deforest, PA   12.5 mg at 07/27/16 2200  . multivitamin (RENA-VIT) tablet 1 tablet  1 tablet Oral Daily Orvan Falconer, MD   1 tablet at 07/28/16 1347  . [START ON 07/30/2016] pantoprazole (PROTONIX) injection 40 mg  40 mg Intravenous Q12H Orvan Falconer, MD      . pantoprazole (PROTONIX) injection 40 mg  40 mg Intravenous Q12H Debbe Odea, MD   40 mg at 07/28/16 1349  . pentafluoroprop-tetrafluoroeth (GEBAUERS) aerosol 1 application  1 application Topical PRN [name], MD      . simvastatin (ZOCOR) tablet 20 mg  20 mg Oral QHS Orvan Falconer, MD   20 mg at 07/27/16 2131  . sodium chloride flush (NS) 0.9 % injection 10-40 mL  10-40 mL Intracatheter PRN Waldemar Dickens, MD      . sodium chloride flush (NS) 0.9 % injection 3 mL  3 mL Intravenous Q12H Orvan Falconer, MD   3 mL at 07/28/16 1349    Allergies as of 07/27/2016 - Review Complete 07/27/2016  Allergen Reaction Noted  . Aspirin Other (See Comments) 12/15/2011  . Contrast media [iodinated diagnostic agents] Itching 12/15/2011  . Iron Itching and Other (See Comments) 05/02/2012  . Penicillins Other (See Comments) 12/15/2011  . Plavix [clopidogrel bisulfate] Rash 01/05/2012  . Amlodipine Swelling and Other (See Comments) 01/19/2015  . Bactrim [sulfamethoxazole-trimethoprim] Rash 12/15/2011  . Dexilant [dexlansoprazole] Other (See Comments) 05/29/2013  . Morphine and related Itching 09/16/2014  . Nitrofurantoin Hives 05/27/2015  . Prilosec [omeprazole] Other (See Comments) 10/15/2013  . Venofer [ferric oxide] Itching 05/02/2012  . Tylenol [acetaminophen] Other (See Comments) 07/08/2016  . Levaquin [levofloxacin in  d5w] Rash 10/04/2013  . Protonix [pantoprazole sodium] Rash 03/21/2013    Family History  Problem Relation Age of Onset  . Heart disease Mother     Heart Disease before age 52  . Hyperlipidemia Mother   . Hypertension Mother   . Diabetes Mother   . Heart attack Mother   . Heart disease Father     Heart Disease before age 85  . Hyperlipidemia Father   . Hypertension Father   . Diabetes Father   . Diabetes Sister   . Hypertension Sister   . Diabetes Brother   . Hyperlipidemia Brother   . Heart attack Brother   . Hypertension Sister   . Heart attack Brother   . Other      noncontributory for early CAD  . Colon cancer Neg Hx   . Esophageal cancer Neg Hx   . Liver disease Neg Hx   . Kidney disease Neg Hx   . Colon polyps Neg Hx     Social History   Social History  . Marital status: Married    Spouse name: N/A  . Number of children: N/A  . Years of education: N/A   Occupational History  . Not on file.   Social History Main Topics  . Smoking status: Never Smoker  . Smokeless tobacco: Never Used  . Alcohol use No  . Drug use: No  .  Sexual activity: Yes    Birth control/ protection: Surgical   Other Topics Concern  . Not on file   Social History Narrative   Lives in [place], [place] with husband.  Dialysis pt - [facility].    Review of Systems: Decreased appetite. All other systems reviewed and negative except where noted in HPI.  Physical Exam: Vital signs in last 24 hours: Temp:  [97.8 F (36.6 C)-98.4 F (36.9 C)] 97.8 F (36.6 C) (01/18 0751) Pulse Rate:  [76-120] 108 (01/18 1159) Resp:  [14-21] 14 (01/18 0751) BP: (120-223)/(51-81) 192/72 (01/18 1158) SpO2:  [97 %-100 %] 97 % (01/18 0751) Weight:  [127 lb 9.6 oz (57.9 kg)] 127 lb 9.6 oz (57.9 kg) (01/18 0413) Last BM Date: 07/27/16 General:   Alert,  well-developed black female in NAD Head:  Normocephalic and atraumatic. Eyes:  Sclera clear, no icterus.   Conjunctiva pink. Ears:  Normal auditory  acuity. Nose:  No deformity, discharge,  or lesions  Neck: no masses felt. Dialysis cath right neck Lungs:  Clear throughout to auscultation.   No wheezes, crackles, or rhonchi.  Heart:  Regular rate and rhythm; no murmurs, clicks, rubs,  or gallops. Abdomen:  Soft,nontender, BS active,nonpalp mass or hsm.   Rectal:  Deferred  Msk:  Symmetrical without gross deformities. . Pulses:  Normal pulses noted. Extremities:  Without clubbing or edema. Neurologic:  Alert and  oriented x4;  grossly normal neurologically. Skin:  Intact without significant lesions or rashes.. Psych:  Alert and cooperative. Normal mood and affect.  Intake/Output from previous day: 01/17 0701 - 01/18 0700 In: 877.6 [P.O.:360; I.V.:417.6; IV Piggyback:100] Out: -  Intake/Output this shift: No intake/output data recorded.  Lab Results:  Recent Labs  07/27/16 0355  07/27/16 1640 07/28/16 0232 07/28/16 0817  WBC 11.7*  --  9.8  --  7.8  HGB 8.6*  < > 8.1* 7.6* 7.4*  HCT 27.2*  < > 25.8* 24.2* 23.8*  PLT 303  --  258  --  214  < > = values in this interval not displayed. BMET  Recent Labs  07/26/16 1847 07/27/16 0355 07/27/16 1640  NA 135 134* 137  K 2.4* 4.3 4.6  CL 95* 92* 99*  CO2  --  26 26  GLUCOSE 99 147* 97  BUN 23* 47* 53*  CREATININE 5.00* 7.99* 9.11*  CALCIUM  --  8.8* 8.1*   LFT  Recent Labs  07/27/16 1640  ALBUMIN 2.8*   PT/INR  Recent Labs  07/26/16 1050  LABPROT 13.4  INR 1.02    Studies/Results: Dg Chest 2 View  Result Date: 07/27/2016 CLINICAL DATA:  76 y/o  F; headache and chills. EXAM: CHEST  2 VIEW COMPARISON:  07/16/2016 chest radiograph. FINDINGS: Stable cardiac silhouette within normal limits. Right central venous catheter tip projects over the right atrium. Post median sternotomy with wires and alignment. Aortic atherosclerosis with calcification. No acute osseous abnormality is evident. Small left pleural effusion is slightly increased. Pulmonary venous  hypertension. No focal consolidation. Minor left basilar atelectasis. IMPRESSION: Small left pleural effusion and minor left basilar atelectasis. Pulmonary venous hypertension. No focal consolidation. Electronically Signed   By: Kristine Garbe M.D.   On: 07/27/2016 04:38   Ct Head Wo Contrast  Result Date: 07/27/2016 CLINICAL DATA:  Headache. EXAM: CT HEAD WITHOUT CONTRAST TECHNIQUE: Contiguous axial images were obtained from the base of the skull through the vertex without intravenous contrast. COMPARISON:  CT 07/08/2016 FINDINGS: Brain: No evidence of acute infarction, hemorrhage,  hydrocephalus, extra-axial collection or mass lesion/mass effect. Stable atrophy and chronic small vessel ischemia. Chronic lacunar infarct in the left external capsule. Vascular: Atherosclerosis of skullbase vasculature without hyperdense vessel or abnormal calcification. Skull: Normal. Negative for fracture or focal lesion. Sinuses/Orbits: Minimal mucosal thickening in left side of sphenoid sinus. Remaining sinuses are clear. Mastoid air cells are well-aerated. Other: None. IMPRESSION: No acute intracranial abnormality. Stable atrophy and chronic small vessel ischemia. Electronically Signed   By: Jeb Levering M.D.   On: 07/27/2016 04:40   Ir Fluoro Guide Cv Line Right  Result Date: 07/26/2016 INDICATION: End-stage renal disease, clotted AV fistula, hyperkalemia EXAM: NON TUNNELED CENTRAL VENOUS HEMODIALYSIS CATHETER WITH ULTRASOUND FLUOROSCOPIC GUIDANCE MEDICATIONS: 1% lidocaine locally ANESTHESIA/SEDATION: Moderate (conscious) sedation was employed during this procedure. A total of Versed 0.5 mg was administered intravenously. Moderate Sedation Time: 15 minutes. The patient's level of consciousness and vital signs were monitored continuously by radiology nursing throughout the procedure under my direct supervision. FLUOROSCOPY TIME:  Fluoroscopy Time: 0 minutes 24 seconds (1 mGy). COMPLICATIONS: None immediate.  PROCEDURE: Informed written consent was obtained from the patient after a discussion of the risks, benefits, and alternatives to treatment. Questions regarding the procedure were encouraged and answered. The right neck and chest were prepped with chlorhexidine in a sterile fashion, and a sterile drape was applied covering the operative field. Maximum barrier sterile technique with sterile gowns and gloves were used for the procedure. A timeout was performed prior to the initiation of the procedure. After creating a small venotomy incision, a micropuncture kit was utilized to access the right internal jugular vein under direct, real-time ultrasound guidance after the overlying soft tissues were anesthetized with 1% lidocaine with epinephrine. Ultrasound image documentation was performed. Micro guidewire advanced. Four [place] dilator inserted. Amplatz guidewire advanced into the IVC. Tract dilatation performed to insert a 20 cm temporary dialysis catheter. Tip position at the SVC RA junction. Catheter secured with a Prolene suture and a sterile dressing. Blood aspirated easily followed by saline and heparin flushes. External caps applied. Patient tolerated the procedure well. No immediate complication. IMPRESSION: Successful ultrasound and fluoroscopic insertion of a 20 cm temporary right IJ dialysis catheter. Tip SVC RA junction. Ready for use. Electronically Signed   By: Jerilynn Mages.  Shick M.D.   On: 07/26/2016 15:28    Result Date: 07/27/2016 CLINICAL DATA:  Evaluate catheter. EXAM: PORTABLE CHEST 1 VIEW COMPARISON:  Chest radiograph July 27, 2016 FINDINGS: Cardiac silhouette is mildly enlarged. Pulmonary vascular congestion. Mild calcific atherosclerosis aortic knob mild. Status post median sternotomy for CABG. Coronary artery stent. Stable appearance of RIGHT internal jugular central venous catheter with distal tip projecting in proximal atria. No pneumothorax. Bibasilar strandy densities. Blunting of the LEFT  costophrenic angle. No consolidation. Soft tissue planes and included osseous structures are nonsuspicious. IMPRESSION: Stable RIGHT internal jugular central venous catheter distal tip projecting in RIGHT atrium, recommend 2 cm retraction. Bibasilar atelectasis and trace LEFT pleural effusion versus pleural thickening. Mild cardiomegaly and pulmonary vascular congestion. Electronically Signed   By: Elon Alas M.D.   On: 07/27/2016 17:48    IMPRESSION / PLAN:   1. Macrocytic anemia with almost 4 gram drop in hgb from baseline around 11 to 7.4. She is heme positive. On baby ASA only. No overt GI bleeding. No associated shortness of breath. She was admitted with chest pain / elevated trop.  -Hx of intestinal AVS / gastroduodenitis. On PPI at home. Suspect she has occult bleeding from AVMs. If medically stable  will proceed with am enteroscopy with anesthesia support. The risks / benefits of the procedure were explained to the patient and she agrees to proceed. If study negative we can consider proceeding with colonoscopy.   2. Chest pain, elevated troponin with peak of 2.59 at 2:30am, down to 2.08 around 8am. Patient had myoview today, results pending. No chest pain at present.   3. Hx of adenomatous colon polyps. She has just become due for surveillance colonoscopy. Depending on clinical course we may need to do this inpatient. Otherwise her primary GI, Dr. Laural Golden in [place] can do it outpatient.   Multiple medical problems including ESRD on HD. Dialyzes MWF but off schedule with hyperkalemia and so going to dialysis today. She has temporary access in right neck   Tye Savoy NP 07/28/2016, 2:19 PM  Pager number [phone]

## 2016-07-29 NOTE — Transfer of Care (Signed)
Immediate Anesthesia Transfer of Care Note  Patient: Shawna [name]  Procedure(s) Performed: Procedure(s) with comments: ESOPHAGOGASTRODUODENOSCOPY (EGD) (N/A) - enteroscopy  Patient Location: Endoscopy Unit  Anesthesia Type:MAC  Level of Consciousness: awake, alert , oriented and patient cooperative  Airway & Oxygen Therapy: Patient Spontanous Breathing and Patient connected to nasal cannula oxygen  Post-op Assessment: Report given to RN and Post -op Vital signs reviewed and stable  Post vital signs: Reviewed and stable  Last Vitals:  Vitals:   07/29/16 0846 07/29/16 0936  BP: 131/61 (!) 198/77  Pulse: 71 80  Resp: 18 16  Temp: 37.3 C 37.2 C    Last Pain:  Vitals:   07/29/16 0846  TempSrc: Oral  PainSc:          Complications: No apparent anesthesia complications

## 2016-07-29 NOTE — Progress Notes (Signed)
Patient ID: Shawna [name], female   DOB: 04-01-1941, 76 y.o.   MRN: [id number]   IR aware of request for tunneled HD catheter placement. Was intended for 1/18  BUT, Pt now being worked up with Cardiology for increased troponin And GI for anemia/bleed  Scheduled for enteroscopy today Work up with Cardio ongoing   Will wait for all consults and work ups to be complete for IR to move forward with Tunneled HD catheter placement Pt has existing temporary Rt IJ dialysis catheter in place and functioning

## 2016-07-29 NOTE — Interval H&P Note (Signed)
History and Physical Interval Note:  07/29/2016 9:50 AM  Shawna [name]  has presented today for surgery, with the diagnosis of anemia, h/o small bowel AVM  The various methods of treatment have been discussed with the patient and family. After consideration of risks, benefits and other options for treatment, the patient has consented to  Procedure(s) with comments: ESOPHAGOGASTRODUODENOSCOPY (EGD) (N/A) - enteroscopy as a surgical intervention .  The patient's history has been reviewed, patient examined, no change in status, stable for surgery.  I have reviewed the patient's chart and labs.  Questions were answered to the patient's satisfaction.     Renelda Loma Armbruster

## 2016-07-29 NOTE — Progress Notes (Signed)
When placed on telemetry in procedure room, pt appeared to have EKG changes from previous EKG.  Pt asymptomatic.  12 lead EKG performed, results same as previous.  Dr. Ola Spurr at bedside, ok to proceed.  Vista Lawman, RN

## 2016-07-29 NOTE — Op Note (Signed)
[facility] Patient Name: Shawna [name] Procedure Date : 07/29/2016 MRN: [id number] Attending MD: Carlota Raspberry. Armbruster MD, MD Date of Birth: 1941/03/11 CSN: [id number] Age: 76 Admit Type: Inpatient Procedure:                Upper GI endoscopy Indications:              anemia secondary to suspected blood loss, history                            of duodenal AVMs, recent NSTEMI, cath pending Providers:                Remo Lipps P. Armbruster MD, MD, Vista Lawman, RN,                            Despina Pole Tech, Technician Referring MD:              Medicines:                Monitored Anesthesia Care Complications:            No immediate complications. Estimated blood loss:                            Minimal. Estimated Blood Loss:     Estimated blood loss was minimal. Procedure:                Pre-Anesthesia Assessment:                           - Prior to the procedure, a History and Physical                            was performed, and patient medications and                            allergies were reviewed. The patient's tolerance of                            previous anesthesia was also reviewed. The risks                            and benefits of the procedure and the sedation                            options and risks were discussed with the patient.                            All questions were answered, and informed consent                            was obtained. Prior Anticoagulants: The patient has                            taken aspirin, last dose was 1 day prior to  procedure. ASA Grade Assessment: III - A patient                            with severe systemic disease. After reviewing the                            risks and benefits, the patient was deemed in                            satisfactory condition to undergo the procedure.                           After obtaining informed consent, the endoscope was         passed under direct vision. Throughout the                            procedure, the patient's blood pressure, pulse, and                            oxygen saturations were monitored continuously. The                            EC-[id number] ([id number]) scope was introduced through                            the mouth, and advanced to the proximal jejunum.                            The upper GI endoscopy was accomplished without                            difficulty. The patient tolerated the procedure                            well. Scope In: Scope Out: Findings:      Esophagogastric landmarks were identified: the Z-line was found at 36       cm, the gastroesophageal junction was found at 36 cm and the upper       extent of the gastric folds was found at 38 cm from the incisors.      A 2 cm hiatal hernia was present.      The exam of the esophagus was otherwise normal.      The entire examined stomach was normal.      A single 7 mm sessile polyp was found in the distal portion of the       duodenum versus proximal jejunum. It was not removed given recent       symptoms of anemia. I had planned to obtain biopsies and tattoo the       lesion however due to bowel spasm the lesion was not readily / easily       seen despite multiple passes with the endoscope.      The exam of the duodenum was otherwise normal.      A single angiodysplastic lesion was found in the proximal jejunum.       Fulguration  to ablate the lesion by argon plasma was successful.      Exam of the proximal jejunum was otherwise normal. Impression:               - Esophagogastric landmarks identified.                           - 2 cm hiatal hernia.                           - Normal stomach.                           - A single duodenal versus jejunal polyp - suspect                            adenoma, not removed during this exam as above.                           - A single angiodysplastic lesion in the jejunum.                             This could be the cause for the patient's symptoms                            / anemia. Treated successfully with argon plasma                            coagulation (APC). Moderate Sedation:      No moderate sedation, case performed with MAC Recommendation:           - Return patient to hospital ward for ongoing care.                           - Clear liquid diet.                           - Continue present medications.                           - GI service will continue to follow                           - Patient will need repeat exam once stable from                            her acute issues and out of the hospital for                            polypectomy of small bowel polyp, rule out adenoma Procedure Code(s):        --- Professional ---                           43270, Esophagogastroduodenoscopy, flexible,  transoral; with ablation of tumor(s), polyp(s), or                            other lesion(s) (includes pre- and post-dilation                            and guide wire passage, when performed) Diagnosis Code(s):        --- Professional ---                           K44.9, Diaphragmatic hernia without obstruction or                            gangrene                           K31.7, Polyp of stomach and duodenum                           K55.20, Angiodysplasia of colon without hemorrhage                           D50.0, Iron deficiency anemia secondary to blood                            loss (chronic) CPT copyright 2016 American Medical Association. All rights reserved. The codes documented in this report are preliminary and upon coder review may  be revised to meet current compliance requirements. Remo Lipps P. Armbruster MD, MD 07/29/2016 2:07:25 PM This report has been signed electronically. Number of Addenda: 0

## 2016-07-29 NOTE — Progress Notes (Signed)
Per pt, dialysis graph in L upper arm has developed new swelling and tenderness with bending of her arm.  Red area at graph noted, positive bruit. Informed recovery RN.

## 2016-07-29 NOTE — Progress Notes (Addendum)
PROGRESS NOTE    Shawna [name]   QIO:[id number]  DOB: 1940/08/19  DOA: 07/27/2016 PCP: [name], MD   HPI 1/17:  Shawna [name] is an 76 y.o. female with hx of ESRD on HD, clotted AVF on her left upper arm, s/p tunnel catheter placement yesterday, receiving HD urgently yesterday for hyperkalemia, hx of severe HTN, hx of GI bleed, AVM, Carotid disease, HLD, chronic diastolic CHF, presented to the [facility] ER as she was found to be severely HTN at home with SBP 250 and had retrosternal Chest pain.  Hospital Course: noted to have elevated troponin and therefore transferred from [facility] to [facility]. Evaluated by Cardiology and Nephrology. Found to have Heme + stool and started on a Protonix infusion.   Subjective: No complaints, no chest pain or dyspnea.   Assessment & Plan:   Principal Problem:   Hypertensive emergency - may have been related to inability to be dialyzed on her usual day - BP improved- cont Norvasc,  Imdur, Metoprolol and Clonidine  Active Problems:   ACS (acute coronary syndrome)- CAD s/p CABG in 2013 - likely secondary to HTN, fluid overload- cardiology following - 2 D ECHO and Lexiscan ordered - ECHO shows septal and apical hypokinesia with preserved EF - Myoview is + for apical scar with superimposed reversible ischemia- plan for cath on Monday - cont ASA, statin  Hemoccult + stool - EGD in 9/14 >> Non bleeding Duodenal AVM - repeat EGD today showed and AVM in prox jejunum which was ablated.  - has not noted recent bleeding  - due to acute MI, will not hold Aspirin - would cont to follow stool occults intermittently as outpt    Anemia of chronic kidney failure - heme + as well - check anemia panel - given 1 u PRBC 1/18 for Hb 7.4 in setting of ACS  ESRD - with LUE fistula malfunction on Monday - temp cath placed on Tues to allow for dialysis - will have tunneled dialysis cath placed today by IR - further management per Nephro    Chronic  diastolic CHF (congestive heart failure) (HCC) - grade 2 d CHF per ECHO in 3/17 - fluid management with dialysis     DVT prophylaxis: SCDs Code Status: Full code Family Communication: husband Disposition Plan: home when stable Consultants:   cardiology Procedures:   Myoview stress test  2 D ECHO - Left ventricle: Septal and apical hypokinesis. The cavity size   was normal. Wall thickness was normal. Systolic function was   normal. The estimated ejection fraction was in the range of 50%   to 55%. Doppler parameters are consistent with both elevated   ventricular end-diastolic filling pressure and elevated left   atrial filling pressure. - Mitral valve: There was mild regurgitation. Valve area by   continuity equation (using LVOT flow): 1.49 cm^2. - Left atrium: The atrium was moderately dilated. - Atrial septum: No defect or patent foramen ovale was identified. - Pulmonary arteries: PA peak pressure: 54 mm Hg (S).   EGD Antimicrobials:  Anti-infectives    Start     Dose/Rate Route Frequency Ordered Stop   07/29/16 0800  vancomycin (VANCOCIN) IVPB 1000 mg/200 mL premix  Status:  Discontinued     1,000 mg 200 mL/hr over 60 Minutes Intravenous To Radiology 07/29/16 0747 07/29/16 1213       Objective: Vitals:   07/29/16 0936 07/29/16 1140 07/29/16 1150 07/29/16 1200  BP: (!) 198/77 (!) 161/59 (!) 150/42 Marland Kitchen)  176/57  Pulse: 80 89 89 86  Resp: 16 (!) 21 15 19   Temp: 98.9 F (37.2 C) 98.8 F (37.1 C)    TempSrc:  Oral    SpO2:  95% 95% 98%  Weight: 57.6 kg (127 lb)     Height: 5\' 2"  (1.575 m)       Intake/Output Summary (Last 24 hours) at 07/29/16 1243 Last data filed at 07/29/16 1141  Gross per 24 hour  Intake              685 ml  Output                0 ml  Net              685 ml   Filed Weights   07/28/16 0413 07/29/16 0413 07/29/16 0936  Weight: 57.9 kg (127 lb 9.6 oz) 55.6 kg (122 lb 9.6 oz) 57.6 kg (127 lb)    Examination: General exam: Appears  comfortable  HEENT: PERRLA, oral mucosa moist, no sclera icterus or thrush Respiratory system: Clear to auscultation. Respiratory effort normal. Cardiovascular system: S1 & S2 heard, RRR.  No murmurs  Gastrointestinal system: Abdomen soft, non-tender, nondistended. Normal bowel sound. No organomegaly Central nervous system: Alert and oriented. No focal neurological deficits. Extremities: No cyanosis, clubbing or edema Skin: No rashes or ulcers Psychiatry:  Mood & affect appropriate.     Data Reviewed: I have personally reviewed following labs and imaging studies  CBC:  Recent Labs Lab 07/26/16 1050  07/27/16 0355  07/27/16 1640  07/28/16 0817 07/28/16 1411 07/28/16 2037 07/29/16 0239 07/29/16 0835  WBC 10.9*  --  11.7*  --  9.8  --  7.8  --   --  6.7  --   NEUTROABS 10.4*  --  10.2*  --   --   --   --   --   --   --   --   HGB 10.4*  < > 8.6*  < > 8.1*  < > 7.4* 8.3* 11.1* 9.8* 9.8*  HCT 33.0*  < > 27.2*  < > 25.8*  < > 23.8* 26.4* 34.2* 30.2* 30.6*  MCV 100.3*  --  100.7*  --  100.0  --  100.8*  --   --  95.9  --   PLT 366  --  303  --  258  --  214  --   --  238  --   < > = values in this interval not displayed. Basic Metabolic Panel:  Recent Labs Lab 07/26/16 1847 07/27/16 0355 07/27/16 1640 07/28/16 1525 07/29/16 0239  NA 135 134* 137 135 138  K 2.4* 4.3 4.6 4.5 3.7  CL 95* 92* 99* 97* 99*  CO2  --  26 26 23 30   GLUCOSE 99 147* 97 112* 125*  BUN 23* 47* 53* 64* 15  CREATININE 5.00* 7.99* 9.11* 10.68* 4.28*  CALCIUM  --  8.8* 8.1* 7.3* 7.8*  PHOS  --   --  6.7* 5.9*  --    GFR: Estimated Creatinine Clearance: 8.8 mL/min (by C-G formula based on SCr of 4.28 mg/dL (H)). Liver Function Tests:  Recent Labs Lab 07/27/16 1640 07/28/16 1525  ALBUMIN 2.8* 2.8*   No results for input(s): LIPASE, AMYLASE in the last 168 hours. No results for input(s): AMMONIA in the last 168 hours. Coagulation Profile:  Recent Labs Lab 07/26/16 1050  INR 1.02   Cardiac  Enzymes:  Recent Labs Lab 07/27/16 0355  07/28/16 0232 07/28/16 0817 07/28/16 1411  TROPONINI 1.52* 2.59* 2.08* 1.14*   BNP (last 3 results) No results for input(s): PROBNP in the last 8760 hours. HbA1C: No results for input(s): HGBA1C in the last 72 hours. CBG: No results for input(s): GLUCAP in the last 168 hours. Lipid Profile: No results for input(s): CHOL, HDL, LDLCALC, TRIG, CHOLHDL, LDLDIRECT in the last 72 hours. Thyroid Function Tests: No results for input(s): TSH, T4TOTAL, FREET4, T3FREE, THYROIDAB in the last 72 hours. Anemia Panel:  Recent Labs  07/28/16 1411  VITAMINB12 458  FOLATE 32.2  FERRITIN 934*  TIBC 185*  IRON 20*  RETICCTPCT 2.9   Urine analysis:    Component Value Date/Time   COLORURINE YELLOW 12/16/2012 1919   APPEARANCEUR CLOUDY (A) 12/16/2012 1919   LABSPEC 1.009 12/16/2012 1919   PHURINE 7.5 12/16/2012 1919   GLUCOSEU NEGATIVE 12/16/2012 1919   HGBUR TRACE (A) 12/16/2012 1919   BILIRUBINUR NEGATIVE 12/16/2012 1919   KETONESUR NEGATIVE 12/16/2012 1919   PROTEINUR 100 (A) 12/16/2012 1919   UROBILINOGEN 0.2 12/16/2012 1919   NITRITE NEGATIVE 12/16/2012 1919   LEUKOCYTESUR SMALL (A) 12/16/2012 1919   Sepsis Labs: @LABRCNTIP (procalcitonin:4,lacticidven:4) ) Recent Results (from the past 240 hour(s))  MRSA PCR Screening     Status: None   Collection Time: 07/27/16  2:29 PM  Result Value Ref Range Status   MRSA by PCR NEGATIVE NEGATIVE Final    Comment:        The GeneXpert MRSA Assay (FDA approved for NASAL specimens only), is one component of a comprehensive MRSA colonization surveillance program. It is not intended to diagnose MRSA infection nor to guide or monitor treatment for MRSA infections.          Radiology Studies: Nm Myocar Multi W/spect W/wall Motion / Ef  Result Date: 07/29/2016 CLINICAL DATA:  Chest pain EXAM: MYOCARDIAL IMAGING WITH SPECT (REST AND PHARMACOLOGIC-STRESS) GATED LEFT VENTRICULAR WALL MOTION  STUDY LEFT VENTRICULAR EJECTION FRACTION TECHNIQUE: Standard myocardial SPECT imaging was performed after resting intravenous injection of 10 mCi Tc-75m tetrofosmin. Subsequently, intravenous infusion of Lexiscan was performed under the supervision of the Cardiology staff. At peak effect of the drug, 30 mCi Tc-73m tetrofosmin was injected intravenously and standard myocardial SPECT imaging was performed. Quantitative gated imaging was also performed to evaluate left ventricular wall motion, and estimate left ventricular ejection fraction. COMPARISON:  None. FINDINGS: Perfusion: There is a moderate-size the defect in the anterior wall adjacent to the apex and extending into the anterior apex. It is primarily fixed. There is a small area of reversibility towards the base. Wall Motion: Global hypokinesis. Left Ventricular Ejection Fraction: 41 % End diastolic volume 161 ml End systolic volume 64 ml IMPRESSION: 1. There is a primarily fixed defect in the anterior wall and apex which is moderate in size. A small portion of it towards the base is reversible. 2. Global hypokinesis. 3. Left ventricular ejection fraction 41% 4. Non invasive risk stratification*: Intermediate risk. *2012 Appropriate Use Criteria for Coronary Revascularization Focused Update: J Am Coll Cardiol. 0960;45(4):098-119. http://content.airportbarriers.com.aspx?articleid=[email or web address] Electronically Signed   By: Marybelle Killings M.D.   On: 07/29/2016 08:19   Dg Chest Port 1 View  Result Date: 07/27/2016 CLINICAL DATA:  Evaluate catheter. EXAM: PORTABLE CHEST 1 VIEW COMPARISON:  Chest radiograph July 27, 2016 FINDINGS: Cardiac silhouette is mildly enlarged. Pulmonary vascular congestion. Mild calcific atherosclerosis aortic knob mild. Status post median sternotomy for CABG. Coronary artery stent. Stable appearance of RIGHT internal jugular central venous catheter with distal  tip projecting in proximal atria. No pneumothorax. Bibasilar strandy  densities. Blunting of the LEFT costophrenic angle. No consolidation. Soft tissue planes and included osseous structures are nonsuspicious. IMPRESSION: Stable RIGHT internal jugular central venous catheter distal tip projecting in RIGHT atrium, recommend 2 cm retraction. Bibasilar atelectasis and trace LEFT pleural effusion versus pleural thickening. Mild cardiomegaly and pulmonary vascular congestion. Electronically Signed   By: Elon Alas M.D.   On: 07/27/2016 17:48      Scheduled Meds: . sodium chloride   Intravenous Once  . acetaminophen  1,000 mg Oral Once  . amiodarone  200 mg Oral Daily  . amLODipine  2.5 mg Oral Daily  . aspirin EC  81 mg Oral Daily  . cinacalcet  30 mg Oral Q supper  . cloNIDine  0.2 mg Oral TID  . fluticasone  2 spray Each Nare Daily  . heparin  5,000 Units Subcutaneous Q8H  . hydrALAZINE  50 mg Oral Q8H  . isosorbide mononitrate  90 mg Oral Daily  . lanthanum  1,000 mg Oral TID WC  . metoprolol tartrate  12.5 mg Oral BID  . multivitamin  1 tablet Oral Daily  . [START ON 07/30/2016] pantoprazole  40 mg Intravenous Q12H  . pantoprazole (PROTONIX) IV  40 mg Intravenous Q12H  . simvastatin  20 mg Oral QHS  . sodium chloride flush  3 mL Intravenous Q12H   Continuous Infusions:    LOS: 2 days    Time spent in minutes: [place], MD [facility] Pager: [email or web address] Password [facility] 07/29/2016, 12:43 PM

## 2016-07-30 ENCOUNTER — Encounter (HOSPITAL_COMMUNITY): Payer: Self-pay | Admitting: Gastroenterology

## 2016-07-30 DIAGNOSIS — I120 Hypertensive chronic kidney disease with stage 5 chronic kidney disease or end stage renal disease: Secondary | ICD-10-CM | POA: Diagnosis not present

## 2016-07-30 DIAGNOSIS — N186 End stage renal disease: Secondary | ICD-10-CM | POA: Diagnosis not present

## 2016-07-30 DIAGNOSIS — D631 Anemia in chronic kidney disease: Secondary | ICD-10-CM | POA: Diagnosis not present

## 2016-07-30 DIAGNOSIS — I214 Non-ST elevation (NSTEMI) myocardial infarction: Secondary | ICD-10-CM | POA: Diagnosis not present

## 2016-07-30 DIAGNOSIS — N185 Chronic kidney disease, stage 5: Secondary | ICD-10-CM | POA: Diagnosis not present

## 2016-07-30 DIAGNOSIS — N2581 Secondary hyperparathyroidism of renal origin: Secondary | ICD-10-CM | POA: Diagnosis not present

## 2016-07-30 DIAGNOSIS — D62 Acute posthemorrhagic anemia: Secondary | ICD-10-CM | POA: Diagnosis not present

## 2016-07-30 DIAGNOSIS — R195 Other fecal abnormalities: Secondary | ICD-10-CM | POA: Diagnosis not present

## 2016-07-30 DIAGNOSIS — I5033 Acute on chronic diastolic (congestive) heart failure: Secondary | ICD-10-CM | POA: Diagnosis not present

## 2016-07-30 DIAGNOSIS — Z992 Dependence on renal dialysis: Secondary | ICD-10-CM | POA: Diagnosis not present

## 2016-07-30 DIAGNOSIS — I249 Acute ischemic heart disease, unspecified: Secondary | ICD-10-CM | POA: Diagnosis not present

## 2016-07-30 DIAGNOSIS — I161 Hypertensive emergency: Secondary | ICD-10-CM | POA: Diagnosis not present

## 2016-07-30 LAB — HEMOGLOBIN AND HEMATOCRIT, BLOOD
HEMATOCRIT: 31.6 % — AB (ref 36.0–46.0)
Hemoglobin: 10.2 g/dL — ABNORMAL LOW (ref 12.0–15.0)

## 2016-07-30 MED ORDER — DOXERCALCIFEROL 4 MCG/2ML IV SOLN
INTRAVENOUS | Status: AC
Start: 2016-07-30 — End: 2016-07-30
  Filled 2016-07-30: qty 2

## 2016-07-30 MED ORDER — DARBEPOETIN ALFA 100 MCG/0.5ML IJ SOSY
100.0000 ug | PREFILLED_SYRINGE | INTRAMUSCULAR | Status: DC
  Administered 2016-07-30: 100 ug via INTRAVENOUS
  Filled 2016-07-30: qty 0.5

## 2016-07-30 MED ORDER — HYDRALAZINE HCL 20 MG/ML IJ SOLN
INTRAMUSCULAR | Status: AC
  Filled 2016-07-30: qty 1

## 2016-07-30 MED ORDER — DARBEPOETIN ALFA 100 MCG/0.5ML IJ SOSY
PREFILLED_SYRINGE | INTRAMUSCULAR | Status: AC
  Filled 2016-07-30: qty 0.5

## 2016-07-30 MED ORDER — HYDRALAZINE HCL 20 MG/ML IJ SOLN
10.0000 mg | Freq: Once | INTRAMUSCULAR | Status: AC
  Administered 2016-07-30: 10 mg via INTRAVENOUS

## 2016-07-30 MED ORDER — DOXERCALCIFEROL 4 MCG/2ML IV SOLN
1.5000 ug | INTRAVENOUS | Status: DC
  Administered 2016-07-30: 1.5 ug via INTRAVENOUS
  Filled 2016-07-30: qty 2

## 2016-07-30 NOTE — Procedures (Signed)
Patient seen and examined on Hemodialysis. QB 400 mL/ min UF goal 1 liter.   Treatment adjusted as needed.  [name] MD [facility] 5:08 PM

## 2016-07-30 NOTE — Progress Notes (Signed)
Progress Note  Patient Name: Shawna [name] Date of Encounter: 07/30/2016  Primary Cardiologist: Dr. Bronson Ing  Subjective   No chest pain, no shortness of breath  Inpatient Medications    Scheduled Meds: . sodium chloride   Intravenous Once  . acetaminophen  1,000 mg Oral Once  . amiodarone  200 mg Oral Daily  . amLODipine  2.5 mg Oral Daily  . aspirin EC  81 mg Oral Daily  . cinacalcet  30 mg Oral Q supper  . cloNIDine  0.2 mg Oral TID  . darbepoetin (ARANESP) injection - DIALYSIS  100 mcg Intravenous Q Sat-HD  . doxercalciferol  1.5 mcg Intravenous Q T,Th,Sa-HD  . fluticasone  2 spray Each Nare Daily  . heparin  5,000 Units Subcutaneous Q8H  . hydrALAZINE  50 mg Oral Q8H  . isosorbide mononitrate  90 mg Oral Daily  . lanthanum  1,000 mg Oral TID WC  . metoprolol tartrate  12.5 mg Oral BID  . multivitamin  1 tablet Oral Daily  . pantoprazole  40 mg Intravenous Q12H  . pantoprazole (PROTONIX) IV  40 mg Intravenous Q12H  . simvastatin  20 mg Oral QHS  . sodium chloride flush  3 mL Intravenous Q12H   Continuous Infusions:  PRN Meds: sodium chloride, sodium chloride, albuterol, ALPRAZolam, alteplase, heparin, lidocaine (PF), lidocaine-prilocaine, menthol-cetylpyridinium, pentafluoroprop-tetrafluoroeth, sodium chloride flush   Vital Signs    Vitals:   07/30/16 0115 07/30/16 0424 07/30/16 0814 07/30/16 1200  BP: (!) 136/58 (!) 156/67 (!) 161/75 132/74  Pulse: 65 67 71 72  Resp: 20 19 18 14   Temp: 98.4 F (36.9 C) 98.3 F (36.8 C) 98.3 F (36.8 C) 98 F (36.7 C)  TempSrc: Oral Oral Oral Oral  SpO2: 100% 100% 99% 100%  Weight:  129 lb 3.2 oz (58.6 kg)    Height:        Intake/Output Summary (Last 24 hours) at 07/30/16 1329 Last data filed at 07/30/16 0541  Gross per 24 hour  Intake              720 ml  Output                0 ml  Net              720 ml   Filed Weights   07/29/16 0413 07/29/16 0936 07/30/16 0424  Weight: 122 lb 9.6 oz (55.6 kg) 127 lb  (57.6 kg) 129 lb 3.2 oz (58.6 kg)    Telemetry    NSR, brief PAT - Personally Reviewed  ECG    NSR - Personally Reviewed  Physical Exam   GEN: No acute distress.  Neck: No JVDRight IJ hemodialysis catheter Cardiac: RRR, no murmurs, rubs, or gallops.  Respiratory: Clear to auscultation bilaterally. GI: Soft, nontender, non-distended  MS: No edema; No deformity. Neuro:  AAOx3. Psych: Normal affect  Labs    Chemistry Recent Labs Lab 07/27/16 1640 07/28/16 1525 07/29/16 0239  NA 137 135 138  K 4.6 4.5 3.7  CL 99* 97* 99*  CO2 26 23 30   GLUCOSE 97 112* 125*  BUN 53* 64* 15  CREATININE 9.11* 10.68* 4.28*  CALCIUM 8.1* 7.3* 7.8*  ALBUMIN 2.8* 2.8*  --   GFRNONAA 4* 3* 9*  GFRAA 4* 4* 11*  ANIONGAP 12 15 9      Hematology Recent Labs Lab 07/27/16 1640  07/28/16 0817 07/28/16 1411  07/29/16 0239  07/29/16 1723 07/29/16 2013 07/30/16 0227  WBC 9.8  --  7.8  --   --  6.7  --   --   --   --   RBC 2.58*  --  2.36* 2.63*  --  3.15*  --   --   --   --   HGB 8.1*  < > 7.4* 8.3*  < > 9.8*  < > 11.0* 10.0* 10.2*  HCT 25.8*  < > 23.8* 26.4*  < > 30.2*  < > 34.0* 30.9* 31.6*  MCV 100.0  --  100.8*  --   --  95.9  --   --   --   --   MCH 31.4  --  31.4  --   --  31.1  --   --   --   --   MCHC 31.4  --  31.1  --   --  32.5  --   --   --   --   RDW 18.5*  --  18.1*  --   --  19.1*  --   --   --   --   PLT 258  --  214  --   --  238  --   --   --   --   < > = values in this interval not displayed.  Cardiac Enzymes Recent Labs Lab 07/27/16 0355 07/28/16 0232 07/28/16 0817 07/28/16 1411  TROPONINI 1.52* 2.59* 2.08* 1.14*   No results for input(s): TROPIPOC in the last 168 hours.   BNPNo results for input(s): BNP, PROBNP in the last 168 hours.   DDimer No results for input(s): DDIMER in the last 168 hours.   Radiology    No results found.  Cardiac Studies   Cardiac catheterization performed on 12/10/2013 showed a patent left main, focal disease in the mid to  distal LAD, patent diagonal, patent LIMA to LAD, severe stenosis in proximal left circumflex, patent SVG to left circumflex, 70% proximal ramus stenosis, patent SVG to ramus, occluded stent to ostium of RCA and finely patent SVG to distal RCA.  Patient Profile     76 y.o. female with non-ST elevation myocardial infarction EF 55%, 41% by nuclear, anteroapical scar with elevated troponin. Awaiting cardiac catheterization.  Has clotted AV fistula left upper arm. History of GI bleed, AVM.  Assessment & Plan    Non-STEMI  - Cardiac catheterization Monday.  - Troponin peak 2.5  Essential hypertension  - We'll increase amlodipine to 10  End-stage renal disease  - In need of tunneled HD catheter. Currently has right IJ HD catheter.  Signed, Candee Furbish, MD  07/30/2016, 1:29 PM

## 2016-07-30 NOTE — Progress Notes (Signed)
[facility] Progress Note   Assessment/ Plan:   1 Chest pain/ NSTEMI: trops elevated, Cath monday.  Per cardiology.   2.  GIB: s/p jejunal AVM ablation 2 ESRD: MWF normally, will dialyze off sched today (saturday) and then resume after cath Monday 3 Hypertension: initially had hypertensive urgency, pressures better with resumption of home meds 4. Anemia of ESRD: Will need to increase ESA 5. Metabolic Bone Disease: fosrenol as binder when eating, hectorol with HD 6.  Access, TDC, vein mapping, potential VVS c/s but need primary issues resolved first  Subjective:    S/p EGD with jejunal AVM, ablated.  HD today.  Cath Monday.   Objective:   BP (!) 161/75 (BP Location: Right Arm)   Pulse 71   Temp 98.3 F (36.8 C) (Oral)   Resp 18   Ht 5\' 2"  (1.575 m)   Wt 58.6 kg (129 lb 3.2 oz)   SpO2 99%   BMI 23.63 kg/m   Physical Exam:   GEN: NAD, appears younger than stated age 76: EOMI, PERRL, MMM NECK: no JVD PULM: clear CV RRR loud S2 ABD benign EXT trace LE edema ACCESS: clotted AVF, nontunneled cath  Labs: BMET  Recent Labs Lab 07/26/16 1358 07/26/16 1539 07/26/16 1847 07/27/16 0355 07/27/16 1640 07/28/16 1525 07/29/16 0239  NA 133*  133*  --  135 134* 137 135 138  K 7.5*  7.5* 6.4* 2.4* 4.3 4.6 4.5 3.7  CL  --   --  95* 92* 99* 97* 99*  CO2  --   --   --  26 26 23 30   GLUCOSE 137*  137*  --  99 147* 97 112* 125*  BUN  --   --  23* 47* 53* 64* 15  CREATININE  --   --  5.00* 7.99* 9.11* 10.68* 4.28*  CALCIUM  --   --   --  8.8* 8.1* 7.3* 7.8*  PHOS  --   --   --   --  6.7* 5.9*  --    CBC  Recent Labs Lab 07/26/16 1050  07/27/16 0355  07/27/16 1640  07/28/16 0817  07/29/16 0239 07/29/16 0835 07/29/16 1723 07/29/16 2013 07/30/16 0227  WBC 10.9*  --  11.7*  --  9.8  --  7.8  --  6.7  --   --   --   --   NEUTROABS 10.4*  --  10.2*  --   --   --   --   --   --   --   --   --   --   HGB 10.4*  < > 8.6*  < > 8.1*  < > 7.4*  < > 9.8*  9.8* 11.0* 10.0* 10.2*  HCT 33.0*  < > 27.2*  < > 25.8*  < > 23.8*  < > 30.2* 30.6* 34.0* 30.9* 31.6*  MCV 100.3*  --  100.7*  --  100.0  --  100.8*  --  95.9  --   --   --   --   PLT 366  --  303  --  258  --  214  --  238  --   --   --   --   < > = values in this interval not displayed.  @IMGRELPRIORS @ Medications:    . sodium chloride   Intravenous Once  . acetaminophen  1,000 mg Oral Once  . amiodarone  200 mg Oral Daily  . amLODipine  2.5 mg  Oral Daily  . aspirin EC  81 mg Oral Daily  . cinacalcet  30 mg Oral Q supper  . cloNIDine  0.2 mg Oral TID  . darbepoetin (ARANESP) injection - DIALYSIS  100 mcg Intravenous Q Sat-HD  . doxercalciferol  1.5 mcg Intravenous Q T,Th,Sa-HD  . fluticasone  2 spray Each Nare Daily  . heparin  5,000 Units Subcutaneous Q8H  . hydrALAZINE  50 mg Oral Q8H  . isosorbide mononitrate  90 mg Oral Daily  . lanthanum  1,000 mg Oral TID WC  . metoprolol tartrate  12.5 mg Oral BID  . multivitamin  1 tablet Oral Daily  . pantoprazole  40 mg Intravenous Q12H  . pantoprazole (PROTONIX) IV  40 mg Intravenous Q12H  . simvastatin  20 mg Oral QHS  . sodium chloride flush  3 mL Intravenous Q12H     [name] MD 07/30/2016, 10:44 AM

## 2016-07-30 NOTE — Progress Notes (Signed)
GI UPDATE:  I attempted to see the patient this evening but she was off the ward. Chart reviewed, plan for cardiac cath Monday. S/p enteroscopy yesterday with APC ablation of jejunal AVM which could have caused her symptoms.  Hgb is stable with no reported symptoms per chart.  Continue PPI for now and advance diet as tolerated. She will warrant a repeat enteroscopy once through her acute course, to follow up previously noted jejunal polyp seen on enteroscopy. We will coordinate an office visit.  Please page with any recurrence of symptoms while hospitalized. We will otherwise sign off at this time.   [place] Cellar, MD [facility] Gastroenterology Pager [phone]

## 2016-07-30 NOTE — Progress Notes (Signed)
PROGRESS NOTE    Shawna [name]   EOF:[id number]  DOB: January 31, 1941  DOA: 07/27/2016 PCP: [name], MD   HPI 1/17:  Shawna [name] is an 76 y.o. female with hx of ESRD on HD, clotted AVF on her left upper arm, s/p tunnel catheter placement yesterday, receiving HD urgently yesterday for hyperkalemia, hx of severe HTN, hx of GI bleed, AVM, Carotid disease, HLD, chronic diastolic CHF, presented to the [facility] ER as she was found to be severely HTN at home with SBP 250 and had retrosternal Chest pain.  Hospital Course: noted to have elevated troponin and therefore transferred from [facility] to [facility]. Evaluated by Cardiology and Nephrology. Found to have Heme + stool and started on a Protonix infusion.   Subjective: No complaints, no chest pain or dyspnea.   Assessment & Plan:   Principal Problem:   Hypertensive emergency - may have been related to inability to be dialyzed on her usual day - BP improved- cont Norvasc,  Imdur, Metoprolol and Clonidine  Active Problems:   ACS (acute coronary syndrome)- CAD s/p CABG in 2013 - likely secondary to HTN, fluid overload- cardiology following - 2 D ECHO and Lexiscan ordered - ECHO shows septal and apical hypokinesia with preserved EF - Myoview is + for apical scar with superimposed reversible ischemia- plan for cath on Monday - cont ASA, statin  Hemoccult + stool - EGD in 9/14 >> Non bleeding Duodenal AVM - repeat EGD showed an AVM in prox jejunum which was ablated.  - has not noted recent bleeding  - due to acute MI, will not hold Aspirin - would cont to follow stool occults intermittently as outpt    Anemia of chronic kidney failure - heme + as well - checked anemia panel- consistent with AOCD - given 1 u PRBC 1/18 for Hb 7.4 in setting of ACS- Hb now 10.2  ESRD - with LUE fistula malfunction on Monday - temp cath placed on Tues to allow for dialysis - will have tunneled dialysis cath placed today by IR - further  management per Nephro    Chronic diastolic CHF (congestive heart failure) (HCC) - grade 2 d CHF per ECHO in 3/17 - fluid management with dialysis   DVT prophylaxis: SCDs Code Status: Full code Family Communication: husband Disposition Plan: home when stable- Cath on Monday Consultants:   cardiology Procedures:   Myoview stress test  2 D ECHO - Left ventricle: Septal and apical hypokinesis. The cavity size   was normal. Wall thickness was normal. Systolic function was   normal. The estimated ejection fraction was in the range of 50%   to 55%. Doppler parameters are consistent with both elevated   ventricular end-diastolic filling pressure and elevated left   atrial filling pressure. - Mitral valve: There was mild regurgitation. Valve area by   continuity equation (using LVOT flow): 1.49 cm^2. - Left atrium: The atrium was moderately dilated. - Atrial septum: No defect or patent foramen ovale was identified. - Pulmonary arteries: PA peak pressure: 54 mm Hg (S).   EGD Antimicrobials:  Anti-infectives    Start     Dose/Rate Route Frequency Ordered Stop   07/29/16 0800  vancomycin (VANCOCIN) IVPB 1000 mg/200 mL premix  Status:  Discontinued     1,000 mg 200 mL/hr over 60 Minutes Intravenous To Radiology 07/29/16 0747 07/29/16 1213       Objective: Vitals:   07/29/16 1950 07/30/16 0115 07/30/16 0424 07/30/16 0814  BP:  Marland Kitchen)  136/58 (!) 156/67 (!) 161/75  Pulse: 87 65 67 71  Resp:  20 19 18   Temp: 99.9 F (37.7 C) 98.4 F (36.9 C) 98.3 F (36.8 C) 98.3 F (36.8 C)  TempSrc: Oral Oral Oral Oral  SpO2:  100% 100% 99%  Weight:   58.6 kg (129 lb 3.2 oz)   Height:        Intake/Output Summary (Last 24 hours) at 07/30/16 1136 Last data filed at 07/30/16 0541  Gross per 24 hour  Intake             1350 ml  Output                0 ml  Net             1350 ml   Filed Weights   07/29/16 0413 07/29/16 0936 07/30/16 0424  Weight: 55.6 kg (122 lb 9.6 oz) 57.6 kg (127  lb) 58.6 kg (129 lb 3.2 oz)    Examination: General exam: Appears comfortable  HEENT: PERRLA, oral mucosa moist, no sclera icterus or thrush Respiratory system: Clear to auscultation. Respiratory effort normal. Cardiovascular system: S1 & S2 heard, RRR.  No murmurs  Gastrointestinal system: Abdomen soft, non-tender, nondistended. Normal bowel sound. No organomegaly Central nervous system: Alert and oriented. No focal neurological deficits. Extremities: No cyanosis, clubbing or edema Skin: No rashes or ulcers Psychiatry:  Mood & affect appropriate.     Data Reviewed: I have personally reviewed following labs and imaging studies  CBC:  Recent Labs Lab 07/26/16 1050  07/27/16 0355  07/27/16 1640  07/28/16 0817  07/29/16 0239 07/29/16 0835 07/29/16 1723 07/29/16 2013 07/30/16 0227  WBC 10.9*  --  11.7*  --  9.8  --  7.8  --  6.7  --   --   --   --   NEUTROABS 10.4*  --  10.2*  --   --   --   --   --   --   --   --   --   --   HGB 10.4*  < > 8.6*  < > 8.1*  < > 7.4*  < > 9.8* 9.8* 11.0* 10.0* 10.2*  HCT 33.0*  < > 27.2*  < > 25.8*  < > 23.8*  < > 30.2* 30.6* 34.0* 30.9* 31.6*  MCV 100.3*  --  100.7*  --  100.0  --  100.8*  --  95.9  --   --   --   --   PLT 366  --  303  --  258  --  214  --  238  --   --   --   --   < > = values in this interval not displayed. Basic Metabolic Panel:  Recent Labs Lab 07/26/16 1847 07/27/16 0355 07/27/16 1640 07/28/16 1525 07/29/16 0239  NA 135 134* 137 135 138  K 2.4* 4.3 4.6 4.5 3.7  CL 95* 92* 99* 97* 99*  CO2  --  26 26 23 30   GLUCOSE 99 147* 97 112* 125*  BUN 23* 47* 53* 64* 15  CREATININE 5.00* 7.99* 9.11* 10.68* 4.28*  CALCIUM  --  8.8* 8.1* 7.3* 7.8*  PHOS  --   --  6.7* 5.9*  --    GFR: Estimated Creatinine Clearance: 8.8 mL/min (by C-G formula based on SCr of 4.28 mg/dL (H)). Liver Function Tests:  Recent Labs Lab 07/27/16 1640 07/28/16 1525  ALBUMIN 2.8* 2.8*   No results  for input(s): LIPASE, AMYLASE in the last  168 hours. No results for input(s): AMMONIA in the last 168 hours. Coagulation Profile:  Recent Labs Lab 07/26/16 1050  INR 1.02   Cardiac Enzymes:  Recent Labs Lab 07/27/16 0355 07/28/16 0232 07/28/16 0817 07/28/16 1411  TROPONINI 1.52* 2.59* 2.08* 1.14*   BNP (last 3 results) No results for input(s): PROBNP in the last 8760 hours. HbA1C: No results for input(s): HGBA1C in the last 72 hours. CBG: No results for input(s): GLUCAP in the last 168 hours. Lipid Profile: No results for input(s): CHOL, HDL, LDLCALC, TRIG, CHOLHDL, LDLDIRECT in the last 72 hours. Thyroid Function Tests: No results for input(s): TSH, T4TOTAL, FREET4, T3FREE, THYROIDAB in the last 72 hours. Anemia Panel:  Recent Labs  07/28/16 1411  VITAMINB12 458  FOLATE 32.2  FERRITIN 934*  TIBC 185*  IRON 20*  RETICCTPCT 2.9   Urine analysis:    Component Value Date/Time   COLORURINE YELLOW 12/16/2012 1919   APPEARANCEUR CLOUDY (A) 12/16/2012 1919   LABSPEC 1.009 12/16/2012 1919   PHURINE 7.5 12/16/2012 1919   GLUCOSEU NEGATIVE 12/16/2012 1919   HGBUR TRACE (A) 12/16/2012 1919   BILIRUBINUR NEGATIVE 12/16/2012 1919   KETONESUR NEGATIVE 12/16/2012 1919   PROTEINUR 100 (A) 12/16/2012 1919   UROBILINOGEN 0.2 12/16/2012 1919   NITRITE NEGATIVE 12/16/2012 1919   LEUKOCYTESUR SMALL (A) 12/16/2012 1919   Sepsis Labs: @LABRCNTIP (procalcitonin:4,lacticidven:4) ) Recent Results (from the past 240 hour(s))  MRSA PCR Screening     Status: None   Collection Time: 07/27/16  2:29 PM  Result Value Ref Range Status   MRSA by PCR NEGATIVE NEGATIVE Final    Comment:        The GeneXpert MRSA Assay (FDA approved for NASAL specimens only), is one component of a comprehensive MRSA colonization surveillance program. It is not intended to diagnose MRSA infection nor to guide or monitor treatment for MRSA infections.          Radiology Studies: Nm Myocar Multi W/spect W/wall Motion / Ef  Result  Date: 07/29/2016 CLINICAL DATA:  Chest pain EXAM: MYOCARDIAL IMAGING WITH SPECT (REST AND PHARMACOLOGIC-STRESS) GATED LEFT VENTRICULAR WALL MOTION STUDY LEFT VENTRICULAR EJECTION FRACTION TECHNIQUE: Standard myocardial SPECT imaging was performed after resting intravenous injection of 10 mCi Tc-43m tetrofosmin. Subsequently, intravenous infusion of Lexiscan was performed under the supervision of the Cardiology staff. At peak effect of the drug, 30 mCi Tc-78m tetrofosmin was injected intravenously and standard myocardial SPECT imaging was performed. Quantitative gated imaging was also performed to evaluate left ventricular wall motion, and estimate left ventricular ejection fraction. COMPARISON:  None. FINDINGS: Perfusion: There is a moderate-size the defect in the anterior wall adjacent to the apex and extending into the anterior apex. It is primarily fixed. There is a small area of reversibility towards the base. Wall Motion: Global hypokinesis. Left Ventricular Ejection Fraction: 41 % End diastolic volume 578 ml End systolic volume 64 ml IMPRESSION: 1. There is a primarily fixed defect in the anterior wall and apex which is moderate in size. A small portion of it towards the base is reversible. 2. Global hypokinesis. 3. Left ventricular ejection fraction 41% 4. Non invasive risk stratification*: Intermediate risk. *2012 Appropriate Use Criteria for Coronary Revascularization Focused Update: J Am Coll Cardiol. 4696;29(5):284-132. http://content.airportbarriers.com.aspx?articleid=[email or web address] Electronically Signed   By: Marybelle Killings M.D.   On: 07/29/2016 08:19      Scheduled Meds: . sodium chloride   Intravenous Once  . acetaminophen  1,000 mg Oral Once  .  amiodarone  200 mg Oral Daily  . amLODipine  2.5 mg Oral Daily  . aspirin EC  81 mg Oral Daily  . cinacalcet  30 mg Oral Q supper  . cloNIDine  0.2 mg Oral TID  . darbepoetin (ARANESP) injection - DIALYSIS  100 mcg Intravenous Q Sat-HD  .  doxercalciferol  1.5 mcg Intravenous Q T,Th,Sa-HD  . fluticasone  2 spray Each Nare Daily  . heparin  5,000 Units Subcutaneous Q8H  . hydrALAZINE  50 mg Oral Q8H  . isosorbide mononitrate  90 mg Oral Daily  . lanthanum  1,000 mg Oral TID WC  . metoprolol tartrate  12.5 mg Oral BID  . multivitamin  1 tablet Oral Daily  . pantoprazole  40 mg Intravenous Q12H  . pantoprazole (PROTONIX) IV  40 mg Intravenous Q12H  . simvastatin  20 mg Oral QHS  . sodium chloride flush  3 mL Intravenous Q12H   Continuous Infusions:    LOS: 3 days    Time spent in minutes: [place], MD [facility] Pager: [email or web address] Password [facility] 07/30/2016, 11:36 AM

## 2016-07-31 DIAGNOSIS — I248 Other forms of acute ischemic heart disease: Secondary | ICD-10-CM | POA: Diagnosis not present

## 2016-07-31 DIAGNOSIS — Z992 Dependence on renal dialysis: Secondary | ICD-10-CM | POA: Diagnosis not present

## 2016-07-31 DIAGNOSIS — I249 Acute ischemic heart disease, unspecified: Secondary | ICD-10-CM | POA: Diagnosis not present

## 2016-07-31 DIAGNOSIS — N2581 Secondary hyperparathyroidism of renal origin: Secondary | ICD-10-CM | POA: Diagnosis not present

## 2016-07-31 DIAGNOSIS — I5033 Acute on chronic diastolic (congestive) heart failure: Secondary | ICD-10-CM | POA: Diagnosis not present

## 2016-07-31 DIAGNOSIS — N186 End stage renal disease: Secondary | ICD-10-CM | POA: Diagnosis not present

## 2016-07-31 DIAGNOSIS — I161 Hypertensive emergency: Secondary | ICD-10-CM | POA: Diagnosis not present

## 2016-07-31 DIAGNOSIS — I214 Non-ST elevation (NSTEMI) myocardial infarction: Secondary | ICD-10-CM | POA: Diagnosis not present

## 2016-07-31 DIAGNOSIS — D631 Anemia in chronic kidney disease: Secondary | ICD-10-CM | POA: Diagnosis not present

## 2016-07-31 DIAGNOSIS — D62 Acute posthemorrhagic anemia: Secondary | ICD-10-CM | POA: Diagnosis not present

## 2016-07-31 DIAGNOSIS — I5032 Chronic diastolic (congestive) heart failure: Secondary | ICD-10-CM | POA: Diagnosis not present

## 2016-07-31 DIAGNOSIS — I169 Hypertensive crisis, unspecified: Secondary | ICD-10-CM | POA: Diagnosis not present

## 2016-07-31 DIAGNOSIS — I120 Hypertensive chronic kidney disease with stage 5 chronic kidney disease or end stage renal disease: Secondary | ICD-10-CM | POA: Diagnosis not present

## 2016-07-31 DIAGNOSIS — R195 Other fecal abnormalities: Secondary | ICD-10-CM | POA: Diagnosis not present

## 2016-07-31 LAB — TYPE AND SCREEN
ABO/RH(D): O POS
ANTIBODY SCREEN: NEGATIVE
DONOR AG TYPE: NEGATIVE
Unit division: 0
Unit division: 0

## 2016-07-31 LAB — BASIC METABOLIC PANEL
Anion gap: 9 (ref 5–15)
BUN: 11 mg/dL (ref 6–20)
CALCIUM: 8.4 mg/dL — AB (ref 8.9–10.3)
CHLORIDE: 98 mmol/L — AB (ref 101–111)
CO2: 27 mmol/L (ref 22–32)
CREATININE: 4.27 mg/dL — AB (ref 0.44–1.00)
GFR calc Af Amer: 11 mL/min — ABNORMAL LOW (ref >60)
GFR calc non Af Amer: 9 mL/min — ABNORMAL LOW (ref >60)
Glucose, Bld: 111 mg/dL — ABNORMAL HIGH (ref 65–99)
Potassium: 3.8 mmol/L (ref 3.5–5.1)
SODIUM: 134 mmol/L — AB (ref 135–145)

## 2016-07-31 LAB — CBC
HCT: 32 % — ABNORMAL LOW (ref 36.0–46.0)
Hemoglobin: 10.2 g/dL — ABNORMAL LOW (ref 12.0–15.0)
MCH: 31.1 pg (ref 26.0–34.0)
MCHC: 31.9 g/dL (ref 30.0–36.0)
MCV: 97.6 fL (ref 78.0–100.0)
Platelets: 195 10*3/uL (ref 150–400)
RBC: 3.28 MIL/uL — ABNORMAL LOW (ref 3.87–5.11)
RDW: 17.5 % — AB (ref 11.5–15.5)
WBC: 7.2 10*3/uL (ref 4.0–10.5)

## 2016-07-31 MED ORDER — PANTOPRAZOLE SODIUM 40 MG PO TBEC
40.0000 mg | DELAYED_RELEASE_TABLET | Freq: Every day | ORAL | Status: DC
  Administered 2016-07-31 – 2016-08-02 (×3): 40 mg via ORAL
  Filled 2016-07-31 (×3): qty 1

## 2016-07-31 MED ORDER — ASPIRIN 81 MG PO CHEW
81.0000 mg | CHEWABLE_TABLET | ORAL | Status: AC
  Administered 2016-08-01: 81 mg via ORAL
  Filled 2016-07-31: qty 1

## 2016-07-31 MED ORDER — SODIUM CHLORIDE 0.9 % IV SOLN: INTRAVENOUS | Status: DC

## 2016-07-31 NOTE — Progress Notes (Addendum)
Progress Note  Patient Name: Shawna [name] Date of Encounter: 07/31/2016  Primary Cardiologist: Dr. Bronson Ing  Subjective   No chest pain, no shortness of breath. States she had not had BM since Tuesday.   Inpatient Medications    Scheduled Meds: . sodium chloride   Intravenous Once  . acetaminophen  1,000 mg Oral Once  . amiodarone  200 mg Oral Daily  . amLODipine  2.5 mg Oral Daily  . aspirin EC  81 mg Oral Daily  . cinacalcet  30 mg Oral Q supper  . cloNIDine  0.2 mg Oral TID  . darbepoetin (ARANESP) injection - DIALYSIS  100 mcg Intravenous Q Sat-HD  . doxercalciferol  1.5 mcg Intravenous Q T,Th,Sa-HD  . fluticasone  2 spray Each Nare Daily  . heparin  5,000 Units Subcutaneous Q8H  . hydrALAZINE  50 mg Oral Q8H  . isosorbide mononitrate  90 mg Oral Daily  . lanthanum  1,000 mg Oral TID WC  . metoprolol tartrate  12.5 mg Oral BID  . multivitamin  1 tablet Oral Daily  . pantoprazole  40 mg Intravenous Q12H  . pantoprazole (PROTONIX) IV  40 mg Intravenous Q12H  . simvastatin  20 mg Oral QHS  . sodium chloride flush  3 mL Intravenous Q12H   Continuous Infusions:  PRN Meds: sodium chloride, sodium chloride, albuterol, ALPRAZolam, alteplase, heparin, lidocaine (PF), lidocaine-prilocaine, menthol-cetylpyridinium, pentafluoroprop-tetrafluoroeth, sodium chloride flush   Vital Signs    Vitals:   07/30/16 1904 07/30/16 2332 07/31/16 0500 07/31/16 0854  BP: (!) 163/66 (!) 144/74 (!) 155/67 (!) 188/72  Pulse: 76 88 72 79  Resp: 20 20 17 18   Temp: 99 F (37.2 C) 98.4 F (36.9 C) 98.8 F (37.1 C) 97.5 F (36.4 C)  TempSrc: Oral Oral Oral Axillary  SpO2: 100% 98% 99% 98%  Weight: 123 lb 14.4 oz (56.2 kg)  122 lb 14.4 oz (55.7 kg)   Height:        Intake/Output Summary (Last 24 hours) at 07/31/16 0941 Last data filed at 07/31/16 0522  Gross per 24 hour  Intake              490 ml  Output             2000 ml  Net            -1510 ml   Filed Weights   07/30/16  1458 07/30/16 1904 07/31/16 0500  Weight: 128 lb 4.9 oz (58.2 kg) 123 lb 14.4 oz (56.2 kg) 122 lb 14.4 oz (55.7 kg)    Telemetry    NSR, brief PAT - Personally Reviewed  ECG    NSR - Personally Reviewed  Physical Exam   GEN: No acute distress.  Neck: No JVDRight IJ hemodialysis catheter Cardiac: RRR, no murmurs, rubs, or gallops.  Respiratory: Clear to auscultation bilaterally. GI: Soft, nontender, non-distended  MS: No edema; No deformity. Neuro:  AAOx3. Psych: Normal affect  Labs    Chemistry  Recent Labs Lab 07/27/16 1640 07/28/16 1525 07/29/16 0239 07/31/16 0520  NA 137 135 138 134*  K 4.6 4.5 3.7 3.8  CL 99* 97* 99* 98*  CO2 26 23 30 27   GLUCOSE 97 112* 125* 111*  BUN 53* 64* 15 11  CREATININE 9.11* 10.68* 4.28* 4.27*  CALCIUM 8.1* 7.3* 7.8* 8.4*  ALBUMIN 2.8* 2.8*  --   --   GFRNONAA 4* 3* 9* 9*  GFRAA 4* 4* 11* 11*  ANIONGAP 12 15 9  9  Hematology Recent Labs Lab 07/28/16 0817 07/28/16 1411  07/29/16 0239  07/29/16 2013 07/30/16 0227 07/31/16 0520  WBC 7.8  --   --  6.7  --   --   --  7.2  RBC 2.36* 2.63*  --  3.15*  --   --   --  3.28*  HGB 7.4* 8.3*  < > 9.8*  < > 10.0* 10.2* 10.2*  HCT 23.8* 26.4*  < > 30.2*  < > 30.9* 31.6* 32.0*  MCV 100.8*  --   --  95.9  --   --   --  97.6  MCH 31.4  --   --  31.1  --   --   --  31.1  MCHC 31.1  --   --  32.5  --   --   --  31.9  RDW 18.1*  --   --  19.1*  --   --   --  17.5*  PLT 214  --   --  238  --   --   --  195  < > = values in this interval not displayed.  Cardiac Enzymes  Recent Labs Lab 07/27/16 0355 07/28/16 0232 07/28/16 0817 07/28/16 1411  TROPONINI 1.52* 2.59* 2.08* 1.14*   No results for input(s): TROPIPOC in the last 168 hours.   BNPNo results for input(s): BNP, PROBNP in the last 168 hours.   DDimer No results for input(s): DDIMER in the last 168 hours.   Radiology    No results found.  Cardiac Studies   Cardiac catheterization performed on 12/10/2013 showed a patent  left main, focal disease in the mid to distal LAD, patent diagonal, patent LIMA to LAD, severe stenosis in proximal left circumflex, patent SVG to left circumflex, 70% proximal ramus stenosis, patent SVG to ramus, occluded stent to ostium of RCA and finely patent SVG to distal RCA.  Patient Profile     76 y.o. female with non-ST elevation myocardial infarction EF 55%, 41% by nuclear, anteroapical scar with elevated troponin. Awaiting cardiac catheterization.  Has clotted AV fistula left upper arm. History of GI bleed, AVM.  Assessment & Plan    Non-STEMI  - Cardiac catheterization Monday. Consider BMS with GI bleed.   - Troponin peak 2.5  Essential hypertension  - We'll increase amlodipine to 10  End-stage renal disease  - In need of tunneled HD catheter. Currently has right IJ HD catheter.  Hx GI bleed - heme+ stool (1 unit PRBC base Hg 7.4)  - S/p enteroscopy yesterday with APC ablation of jejunal AVM which could have caused her symptoms. Dr. Havery Moros  Signed, Candee Furbish, MD  07/31/2016, 9:41 AM

## 2016-07-31 NOTE — Progress Notes (Signed)
[facility] PROGRESS NOTE  Shawna [name] IDP:[id number] DOB: February 01, 1941 DOA: 07/27/2016 PCP: [name], MD  Assessment/Plan: 76 y.o.femalewith hx of ESRD on HD, clotted AVF on her left upper arm, s/p tunnel catheter placement 1/16, receiving HD urgently prior to admission for hyperkalemia, hx of severe HTN, hx of GI bleed, AVM, Carotid disease, HLD, chronic diastolic CHF, presented to the [facility] ER as she was found to be severely HTN at home with SBP 250 and had retrosternal Chest pain. Noted to have elevated troponin and therefore transferred from [facility] to [facility]. Evaluated by Cardiology and Nephrology. Planned for [facility] on Monday. Found to have Heme + stool and started on a Protonix infusion. Underwent endoscopy found to have AVM.     Hypertensive emergency. may have been related to inability to be dialyzed on her usual day -BP improved- cont Norvasc,  Imdur, Metoprolol and Clonidine  ACS (acute coronary syndrome)- CAD s/p CABG in 2013. likely secondary to HTN, fluid overload- cardiology following. ECHO shows septal and apical hypokinesia with preserved EF. Myoview is + for apical scar with superimposed reversible ischemia -plan for cath on Monday. cont ASA, statin  Hemoccult + stool. EGD in 9/14 >> Non bleeding Duodenal AVM. repeat EGD showed an AVM in prox jejunum which was ablated -no new episodes of bleeding. due to acute MI, will not hold Aspirin. GI plans to f/u as outpt  Anemia of chronic kidney failure. heme + as well on admission. . Received 1 u PRBC 1/18 for Hb 7.4 in setting of ACS- Hb now 10.2  ESRD. with LUE fistula malfunction on Monday. temp cath placed on Tues to allow for dialysis.  -will have tunneled dialysis cath placed today by IR. further management per Nephro  Chronic diastolic CHF (congestive heart failure) ([place]). grade 2 d CHF per ECHO in 3/17.  fluid management with dialysis  Code Status: full Family Communication: d/w patient,rn  (indicate person spoken with, relationship, and if by phone, the number) Disposition Plan: home pend LHC. 24-48 hrs Consultants:   cardiology Procedures:   Myoview stress test  2 D ECHO - Left ventricle: Septal and apical hypokinesis. The cavity size was normal. Wall thickness was normal. Systolic function was normal. The estimated ejection fraction was in the range of 50% to 55%. Doppler parameters are consistent with both elevated ventricular end-diastolic filling pressure and elevated left atrial filling pressure. - Mitral valve: There was mild regurgitation. Valve area by continuity equation (using LVOT flow): 1.49 cm^2. - Left atrium: The atrium was moderately dilated. - Atrial septum: No defect or patent foramen ovale was identified. - Pulmonary arteries: PA peak pressure: 54 mm Hg (S).   EGD Antimicrobials:            Anti-infectives    Start     Dose/Rate Route Frequency Ordered Stop   07/29/16 0800  vancomycin (VANCOCIN) IVPB 1000 mg/200 mL premix  Status:  Discontinued     1,000 mg 200 mL/hr over 60 Minutes Intravenous To Radiology 07/29/16 0747 07/29/16 1213      HPI/Subjective: Alert, no distress  Objective: Vitals:   07/31/16 0854 07/31/16 0944  BP: (!) 188/72 (!) 139/45  Pulse: 79   Resp: 18 (!) 24  Temp: 97.5 F (36.4 C)     Intake/Output Summary (Last 24 hours) at 07/31/16 1135 Last data filed at 07/31/16 0522  Gross per 24 hour  Intake              490 ml  Output             2000 ml  Net            -1510 ml   Filed Weights   07/30/16 1458 07/30/16 1904 07/31/16 0500  Weight: 58.2 kg (128 lb 4.9 oz) 56.2 kg (123 lb 14.4 oz) 55.7 kg (122 lb 14.4 oz)    Exam:   General:  Comfortable   Cardiovascular: s1,s2 rrr  Respiratory: CTYA BL  Abdomen: soft, nt,   Musculoskeletal: no pedal edema    Data Reviewed: Basic Metabolic Panel:  Recent Labs Lab 07/27/16 0355 07/27/16 1640 07/28/16 1525 07/29/16 0239  07/31/16 0520  NA 134* 137 135 138 134*  K 4.3 4.6 4.5 3.7 3.8  CL 92* 99* 97* 99* 98*  CO2 26 26 23 30 27   GLUCOSE 147* 97 112* 125* 111*  BUN 47* 53* 64* 15 11  CREATININE 7.99* 9.11* 10.68* 4.28* 4.27*  CALCIUM 8.8* 8.1* 7.3* 7.8* 8.4*  PHOS  --  6.7* 5.9*  --   --    Liver Function Tests:  Recent Labs Lab 07/27/16 1640 07/28/16 1525  ALBUMIN 2.8* 2.8*   No results for input(s): LIPASE, AMYLASE in the last 168 hours. No results for input(s): AMMONIA in the last 168 hours. CBC:  Recent Labs Lab 07/26/16 1050  07/27/16 0355  07/27/16 1640  07/28/16 0817  07/29/16 0239 07/29/16 0835 07/29/16 1723 07/29/16 2013 07/30/16 0227 07/31/16 0520  WBC 10.9*  --  11.7*  --  9.8  --  7.8  --  6.7  --   --   --   --  7.2  NEUTROABS 10.4*  --  10.2*  --   --   --   --   --   --   --   --   --   --   --   HGB 10.4*  < > 8.6*  < > 8.1*  < > 7.4*  < > 9.8* 9.8* 11.0* 10.0* 10.2* 10.2*  HCT 33.0*  < > 27.2*  < > 25.8*  < > 23.8*  < > 30.2* 30.6* 34.0* 30.9* 31.6* 32.0*  MCV 100.3*  --  100.7*  --  100.0  --  100.8*  --  95.9  --   --   --   --  97.6  PLT 366  --  303  --  258  --  214  --  238  --   --   --   --  195  < > = values in this interval not displayed. Cardiac Enzymes:  Recent Labs Lab 07/27/16 0355 07/28/16 0232 07/28/16 0817 07/28/16 1411  TROPONINI 1.52* 2.59* 2.08* 1.14*   BNP (last 3 results)  Recent Labs  07/16/16 1517  BNP 372.0*    ProBNP (last 3 results) No results for input(s): PROBNP in the last 8760 hours.  CBG: No results for input(s): GLUCAP in the last 168 hours.  Recent Results (from the past 240 hour(s))  MRSA PCR Screening     Status: None   Collection Time: 07/27/16  2:29 PM  Result Value Ref Range Status   MRSA by PCR NEGATIVE NEGATIVE Final    Comment:        The GeneXpert MRSA Assay (FDA approved for NASAL specimens only), is one component of a comprehensive MRSA colonization surveillance program. It is not intended to  diagnose MRSA infection nor to guide or monitor treatment for MRSA infections.  Studies: No results found.  Scheduled Meds: . sodium chloride   Intravenous Once  . acetaminophen  1,000 mg Oral Once  . amiodarone  200 mg Oral Daily  . amLODipine  2.5 mg Oral Daily  . aspirin EC  81 mg Oral Daily  . cinacalcet  30 mg Oral Q supper  . cloNIDine  0.2 mg Oral TID  . darbepoetin (ARANESP) injection - DIALYSIS  100 mcg Intravenous Q Sat-HD  . doxercalciferol  1.5 mcg Intravenous Q T,Th,Sa-HD  . fluticasone  2 spray Each Nare Daily  . heparin  5,000 Units Subcutaneous Q8H  . hydrALAZINE  50 mg Oral Q8H  . isosorbide mononitrate  90 mg Oral Daily  . lanthanum  1,000 mg Oral TID WC  . metoprolol tartrate  12.5 mg Oral BID  . multivitamin  1 tablet Oral Daily  . pantoprazole  40 mg Oral Daily  . simvastatin  20 mg Oral QHS  . sodium chloride flush  3 mL Intravenous Q12H   Continuous Infusions:  Principal Problem:   Hypertensive emergency Active Problems:   HLD (hyperlipidemia)   Mitral regurgitation   Anemia of chronic kidney failure   Acute blood loss anemia   Chronic diastolic CHF (congestive heart failure) (HCC)   Essential hypertension   Acute on chronic diastolic CHF (congestive heart failure) (HCC)   Hypertensive crisis   ACS (acute coronary syndrome) ([place])   ESRD (end stage renal disease) (HCC)   Heme positive stool    Time spent: >35 minutes     Kinnie Feil  [facility] Pager [phone]. If 7PM-7AM, please contact night-coverage at [email or web address], password [facility] 07/31/2016, 11:35 AM  LOS: 4 days

## 2016-07-31 NOTE — Progress Notes (Signed)
[facility] Progress Note   Assessment/ Plan:   1 Chest pain/ NSTEMI: trops elevated, Cath monday.  Per cardiology.   2.  GIB: s/p jejunal AVM ablation 2 ESRD: MWF normally, will dialyze off sched today (saturday) and then resume after cath Monday 3 Hypertension: initially had hypertensive urgency, pressures better with resumption of home meds, inc amlodipine to 10 mg daily per cardiology 4. Anemia of ESRD: Will need to increase ESA, aranesp 100 q saturday 5. Metabolic Bone Disease: fosrenol as binder, hectorol with HD 6.  Access, TDC, vein mapping, potential VVS c/s but need primary issues resolved first  Subjective:    Plan for cardiac cath Monday.  No complaints today.     Objective:   BP (!) 139/45   Pulse 79   Temp 97.5 F (36.4 C) (Axillary)   Resp (!) 24   Ht 5\' 2"  (1.575 m)   Wt 55.7 kg (122 lb 14.4 oz)   SpO2 98%   BMI 22.48 kg/m   Physical Exam:   GEN: NAD, appears younger than stated age 76: EOMI, PERRL, MMM NECK: no JVD PULM: clear CV RRR loud S2 ABD benign EXT trace LE edema ACCESS: clotted AVF, nontunneled cath  Labs: BMET  Recent Labs Lab 07/26/16 1358 07/26/16 1539 07/26/16 1847 07/27/16 0355 07/27/16 1640 07/28/16 1525 07/29/16 0239 07/31/16 0520  NA 133*  133*  --  135 134* 137 135 138 134*  K 7.5*  7.5* 6.4* 2.4* 4.3 4.6 4.5 3.7 3.8  CL  --   --  95* 92* 99* 97* 99* 98*  CO2  --   --   --  26 26 23 30 27   GLUCOSE 137*  137*  --  99 147* 97 112* 125* 111*  BUN  --   --  23* 47* 53* 64* 15 11  CREATININE  --   --  5.00* 7.99* 9.11* 10.68* 4.28* 4.27*  CALCIUM  --   --   --  8.8* 8.1* 7.3* 7.8* 8.4*  PHOS  --   --   --   --  6.7* 5.9*  --   --    CBC  Recent Labs Lab 07/26/16 1050  07/27/16 0355  07/27/16 1640  07/28/16 0817  07/29/16 0239  07/29/16 1723 07/29/16 2013 07/30/16 0227 07/31/16 0520  WBC 10.9*  --  11.7*  --  9.8  --  7.8  --  6.7  --   --   --   --  7.2  NEUTROABS 10.4*  --  10.2*  --   --    --   --   --   --   --   --   --   --   --   HGB 10.4*  < > 8.6*  < > 8.1*  < > 7.4*  < > 9.8*  < > 11.0* 10.0* 10.2* 10.2*  HCT 33.0*  < > 27.2*  < > 25.8*  < > 23.8*  < > 30.2*  < > 34.0* 30.9* 31.6* 32.0*  MCV 100.3*  --  100.7*  --  100.0  --  100.8*  --  95.9  --   --   --   --  97.6  PLT 366  --  303  --  258  --  214  --  238  --   --   --   --  195  < > = values in this interval not displayed.  @IMGRELPRIORS @ Medications:    .  sodium chloride   Intravenous Once  . acetaminophen  1,000 mg Oral Once  . amiodarone  200 mg Oral Daily  . amLODipine  2.5 mg Oral Daily  . aspirin EC  81 mg Oral Daily  . cinacalcet  30 mg Oral Q supper  . cloNIDine  0.2 mg Oral TID  . darbepoetin (ARANESP) injection - DIALYSIS  100 mcg Intravenous Q Sat-HD  . doxercalciferol  1.5 mcg Intravenous Q T,Th,Sa-HD  . fluticasone  2 spray Each Nare Daily  . heparin  5,000 Units Subcutaneous Q8H  . hydrALAZINE  50 mg Oral Q8H  . isosorbide mononitrate  90 mg Oral Daily  . lanthanum  1,000 mg Oral TID WC  . metoprolol tartrate  12.5 mg Oral BID  . multivitamin  1 tablet Oral Daily  . pantoprazole  40 mg Oral Daily  . simvastatin  20 mg Oral QHS  . sodium chloride flush  3 mL Intravenous Q12H     [name] MD 07/31/2016, 10:18 AM

## 2016-08-01 ENCOUNTER — Encounter (HOSPITAL_COMMUNITY): Payer: Self-pay | Admitting: Gastroenterology

## 2016-08-01 ENCOUNTER — Encounter (HOSPITAL_COMMUNITY)
Admission: EM | Disposition: A | Discharge status: Home Health | Payer: Self-pay | Source: Home / Self Care | Attending: Internal Medicine

## 2016-08-01 ENCOUNTER — Inpatient Hospital Stay (HOSPITAL_COMMUNITY): Payer: Medicare HMO

## 2016-08-01 DIAGNOSIS — R195 Other fecal abnormalities: Secondary | ICD-10-CM | POA: Diagnosis not present

## 2016-08-01 DIAGNOSIS — I248 Other forms of acute ischemic heart disease: Secondary | ICD-10-CM

## 2016-08-01 DIAGNOSIS — Z01810 Encounter for preprocedural cardiovascular examination: Secondary | ICD-10-CM

## 2016-08-01 DIAGNOSIS — Z992 Dependence on renal dialysis: Secondary | ICD-10-CM | POA: Diagnosis not present

## 2016-08-01 DIAGNOSIS — I5033 Acute on chronic diastolic (congestive) heart failure: Secondary | ICD-10-CM | POA: Diagnosis not present

## 2016-08-01 DIAGNOSIS — I249 Acute ischemic heart disease, unspecified: Secondary | ICD-10-CM | POA: Diagnosis not present

## 2016-08-01 DIAGNOSIS — I169 Hypertensive crisis, unspecified: Secondary | ICD-10-CM | POA: Diagnosis not present

## 2016-08-01 DIAGNOSIS — D631 Anemia in chronic kidney disease: Secondary | ICD-10-CM

## 2016-08-01 DIAGNOSIS — N2581 Secondary hyperparathyroidism of renal origin: Secondary | ICD-10-CM | POA: Diagnosis not present

## 2016-08-01 DIAGNOSIS — I161 Hypertensive emergency: Secondary | ICD-10-CM | POA: Diagnosis not present

## 2016-08-01 DIAGNOSIS — N186 End stage renal disease: Secondary | ICD-10-CM

## 2016-08-01 DIAGNOSIS — I120 Hypertensive chronic kidney disease with stage 5 chronic kidney disease or end stage renal disease: Secondary | ICD-10-CM | POA: Diagnosis not present

## 2016-08-01 DIAGNOSIS — N185 Chronic kidney disease, stage 5: Secondary | ICD-10-CM | POA: Diagnosis not present

## 2016-08-01 DIAGNOSIS — I5032 Chronic diastolic (congestive) heart failure: Secondary | ICD-10-CM | POA: Diagnosis not present

## 2016-08-01 DIAGNOSIS — D62 Acute posthemorrhagic anemia: Secondary | ICD-10-CM | POA: Diagnosis not present

## 2016-08-01 SURGERY — LEFT HEART CATH AND CORS/GRAFTS ANGIOGRAPHY
Anesthesia: LOCAL

## 2016-08-01 MED ORDER — VANCOMYCIN HCL IN DEXTROSE 1-5 GM/200ML-% IV SOLN
1000.0000 mg | INTRAVENOUS | Status: AC
  Administered 2016-08-02: 1000 mg via INTRAVENOUS
  Filled 2016-08-01: qty 200

## 2016-08-01 MED ORDER — DOXERCALCIFEROL 4 MCG/2ML IV SOLN
INTRAVENOUS | Status: AC
  Administered 2016-08-01: 1.5 ug via INTRAVENOUS
  Filled 2016-08-01: qty 2

## 2016-08-01 MED ORDER — DOXERCALCIFEROL 4 MCG/2ML IV SOLN
1.5000 ug | INTRAVENOUS | Status: DC
  Administered 2016-08-01: 1.5 ug via INTRAVENOUS

## 2016-08-01 MED ORDER — HEPARIN SODIUM (PORCINE) 5000 UNIT/ML IJ SOLN: 5000.0000 [IU] | Freq: Three times a day (TID) | INTRAMUSCULAR | Status: DC

## 2016-08-01 NOTE — Anesthesia Preprocedure Evaluation (Signed)
Anesthesia Evaluation  Patient identified by MRN, date of birth, ID band Patient awake    Reviewed: Allergy & Precautions, NPO status , Patient's Chart, lab work & pertinent test results  Airway Mallampati: II  TM Distance: >3 FB     Dental   Pulmonary pneumonia,    breath sounds clear to auscultation       Cardiovascular hypertension, + angina + CAD, + Past MI, + Peripheral Vascular Disease and +CHF   Rhythm:Regular Rate:Normal     Neuro/Psych TIA   GI/Hepatic PUD, GERD  ,  Endo/Other    Renal/GU Renal disease     Musculoskeletal  (+) Arthritis ,   Abdominal   Peds  Hematology  (+) anemia ,   Anesthesia Other Findings   Reproductive/Obstetrics                             Anesthesia Physical Anesthesia Plan  ASA: III  Anesthesia Plan: General   Post-op Pain Management:    Induction: Intravenous  Airway Management Planned: Mask and Simple Face Mask  Additional Equipment:   Intra-op Plan:   Post-operative Plan:   Informed Consent:   Dental advisory given  Plan Discussed with: CRNA, Anesthesiologist and Surgeon  Anesthesia Plan Comments:         Anesthesia Quick Evaluation

## 2016-08-01 NOTE — Progress Notes (Signed)
Right  Upper Extremity Vein Map    Cephalic  Segment Diameter Depth Comment  1. Axilla 71mm 8.53mm   2. Mid upper arm 2.22mm 3.30mm   3. Above AC 52mm 3.7mm   4. In AC 2.26mm 2.81mm   5. Below AC 2.38mm 4.8mm Branch  6. Mid forearm 3.73mm 51mm   7. Wrist 2.58mm 4.43mm Branch   mm mm    mm mm    mm mm    Basilic  Segment Diameter Depth Comment  1. Axilla 3.59mm 57mm   2. Mid upper arm 3.55mm 39mm   3. Above AC 2.2mm 1.62mm   4. In [facility] 3.80mm 7.29mm   5. Below AC 2.2mm 2.18mm   6. Mid forearm 1.49mm 1.80mm Branch  7. Wrist 2.27mm 48mm    mm mm    mm mm    mm mm    Left arm not mapped secondary to current access

## 2016-08-01 NOTE — Progress Notes (Signed)
[facility] Progress Note   Assessment/ Plan:   1 Chest pain/ NSTEMI: Cath cancelled.  Pt will follow up as an outpatient with her cardiologist to reconsider 2.  GIB: s/p jejunal AVM ablation, no further bleeding 2 ESRD: MWF, next HD today 3 Hypertension: initially had hypertensive urgency, pressures better with resumption of home meds, inc amlodipine to 10 mg daily per cardiology 4. Anemia of ESRD: Will need to increase ESA, aranesp 100 q saturday 5. Metabolic Bone Disease: fosrenol as binder, hectorol with HD 6.  Access, TDC, vein mapping, potential VVS c/s but need primary issues resolved first; will touch base with IR today to see if they are willing to do [facility] tomorrow  Subjective:    Cath cancelled as pt not sure she wanted it   Objective:   BP (!) 166/63   Pulse 74   Temp 97.8 F (36.6 C) (Oral)   Resp 18   Ht 5\' 2"  (1.575 m)   Wt 57.1 kg (125 lb 14.1 oz)   SpO2 96%   BMI 23.02 kg/m   Physical Exam:   GEN: NAD, appears younger than stated age 7: EOMI, PERRL, MMM NECK: no JVD PULM: clear CV RRR loud S2 ABD benign, mildly distended today EXT trace LE edema ACCESS: clotted AVF, nontunneled cath  Labs: BMET  Recent Labs Lab 07/26/16 1358 07/26/16 1539 07/26/16 1847 07/27/16 0355 07/27/16 1640 07/28/16 1525 07/29/16 0239 07/31/16 0520  NA 133*  133*  --  135 134* 137 135 138 134*  K 7.5*  7.5* 6.4* 2.4* 4.3 4.6 4.5 3.7 3.8  CL  --   --  95* 92* 99* 97* 99* 98*  CO2  --   --   --  26 26 23 30 27   GLUCOSE 137*  137*  --  99 147* 97 112* 125* 111*  BUN  --   --  23* 47* 53* 64* 15 11  CREATININE  --   --  5.00* 7.99* 9.11* 10.68* 4.28* 4.27*  CALCIUM  --   --   --  8.8* 8.1* 7.3* 7.8* 8.4*  PHOS  --   --   --   --  6.7* 5.9*  --   --    CBC  Recent Labs Lab 07/26/16 1050  07/27/16 0355  07/27/16 1640  07/28/16 0817  07/29/16 0239  07/29/16 1723 07/29/16 2013 07/30/16 0227 07/31/16 0520  WBC 10.9*  --  11.7*  --  9.8  --   7.8  --  6.7  --   --   --   --  7.2  NEUTROABS 10.4*  --  10.2*  --   --   --   --   --   --   --   --   --   --   --   HGB 10.4*  < > 8.6*  < > 8.1*  < > 7.4*  < > 9.8*  < > 11.0* 10.0* 10.2* 10.2*  HCT 33.0*  < > 27.2*  < > 25.8*  < > 23.8*  < > 30.2*  < > 34.0* 30.9* 31.6* 32.0*  MCV 100.3*  --  100.7*  --  100.0  --  100.8*  --  95.9  --   --   --   --  97.6  PLT 366  --  303  --  258  --  214  --  238  --   --   --   --  195  < > = values in this interval not displayed.  @IMGRELPRIORS @ Medications:    . sodium chloride   Intravenous Once  . acetaminophen  1,000 mg Oral Once  . amiodarone  200 mg Oral Daily  . amLODipine  2.5 mg Oral Daily  . aspirin EC  81 mg Oral Daily  . cinacalcet  30 mg Oral Q supper  . cloNIDine  0.2 mg Oral TID  . darbepoetin (ARANESP) injection - DIALYSIS  100 mcg Intravenous Q Sat-HD  . doxercalciferol  1.5 mcg Intravenous Q T,Th,Sa-HD  . fluticasone  2 spray Each Nare Daily  . [START ON 08/03/2016] heparin  5,000 Units Subcutaneous Q8H  . hydrALAZINE  50 mg Oral Q8H  . isosorbide mononitrate  90 mg Oral Daily  . lanthanum  1,000 mg Oral TID WC  . metoprolol tartrate  12.5 mg Oral BID  . multivitamin  1 tablet Oral Daily  . pantoprazole  40 mg Oral Daily  . simvastatin  20 mg Oral QHS  . sodium chloride flush  3 mL Intravenous Q12H     [name] MD 08/01/2016, 3:20 PM

## 2016-08-01 NOTE — Progress Notes (Signed)
Pt has order for pre cath fluids. Spk with on call Nephrology and instructed not to give any fluids.

## 2016-08-01 NOTE — Progress Notes (Signed)
[facility] PROGRESS NOTE  Shawna [name] JJO:[id number] DOB: 1941/06/05 DOA: 07/27/2016 PCP: [name], MD  Assessment/Plan: 76 y.o.femalewith hx of ESRD on HD, clotted AVF on her left upper arm, s/p tunnel catheter placement 1/16, receiving HD urgently prior to admission for hyperkalemia, hx of severe HTN, hx of GI bleed, AVM, Carotid disease, HLD, chronic diastolic CHF, presented to the [facility] ER as she was found to be severely HTN at home with SBP 250 and had retrosternal Chest pain. Noted to have elevated troponin and therefore transferred from [facility] to [facility]. Evaluated by Cardiology and Nephrology. Planned for [facility] on Monday. Found to have Heme + stool and started on a Protonix infusion. Underwent endoscopy found to have AVM.     Hypertensive emergency. may have been related to inability to be dialyzed on her usual day -BP improved- cont Norvasc,  Imdur, Metoprolol and Clonidine  ACS (acute coronary syndrome)- CAD s/p CABG in 2013. likely secondary to HTN, fluid overload- cardiology following. ECHO shows septal and apical hypokinesia with preserved EF. Myoview is + for apical scar with superimposed reversible ischemia -cardiology d/w patient at length recommended medical management for now. Cancelled LHC. Cont ASA, statin  Hemoccult + stool. EGD in 9/14 >> Non bleeding Duodenal AVM. repeat EGD showed an AVM in prox jejunum which was ablated -no new episodes of bleeding. due to acute MI, will not hold Aspirin. GI plans to f/u as outpt  Anemia of chronic kidney failure. heme + as well on admission. . Received 1 u PRBC 1/18 for Hb 7.4 in setting of ACS- Hb now 10.2  ESRD. with LUE fistula malfunction on Monday. temp cath placed on Tues to allow for dialysis.  -scheduled for HD today. Needs tunneled dialysis cath placed by IR. Defer further management per Nephro  Chronic diastolic CHF (congestive heart failure) ([place]). grade 2 d CHF per ECHO in 3/17.  fluid  management with dialysis  Code Status: full Family Communication: d/w patient,rn (indicate person spoken with, relationship, and if by phone, the number) Disposition Plan: home pend LHC. 24-48 hrs Consultants:   cardiology Procedures:   Myoview stress test  2 D ECHO - Left ventricle: Septal and apical hypokinesis. The cavity size was normal. Wall thickness was normal. Systolic function was normal. The estimated ejection fraction was in the range of 50% to 55%. Doppler parameters are consistent with both elevated ventricular end-diastolic filling pressure and elevated left atrial filling pressure. - Mitral valve: There was mild regurgitation. Valve area by continuity equation (using LVOT flow): 1.49 cm^2. - Left atrium: The atrium was moderately dilated. - Atrial septum: No defect or patent foramen ovale was identified. - Pulmonary arteries: PA peak pressure: 54 mm Hg (S).   EGD Antimicrobials:            Anti-infectives    Start     Dose/Rate Route Frequency Ordered Stop   07/29/16 0800  vancomycin (VANCOCIN) IVPB 1000 mg/200 mL premix  Status:  Discontinued     1,000 mg 200 mL/hr over 60 Minutes Intravenous To Radiology 07/29/16 0747 07/29/16 1213      HPI/Subjective: Alert, no distress  Objective: Vitals:   08/01/16 0944 08/01/16 1411  BP:  (!) 166/63  Pulse: 70 74  Resp:  18  Temp:  97.8 F (36.6 C)    Intake/Output Summary (Last 24 hours) at 08/01/16 1500 Last data filed at 08/01/16 1300  Gross per 24 hour  Intake  240 ml  Output                0 ml  Net              240 ml   Filed Weights   07/30/16 1904 07/31/16 0500 08/01/16 0549  Weight: 56.2 kg (123 lb 14.4 oz) 55.7 kg (122 lb 14.4 oz) 57.1 kg (125 lb 14.1 oz)    Exam:   General:  Comfortable   Cardiovascular: s1,s2 rrr  Respiratory: CTYA BL  Abdomen: soft, nt,   Musculoskeletal: no pedal edema    Data Reviewed: Basic Metabolic Panel:  Recent Labs Lab  07/27/16 0355 07/27/16 1640 07/28/16 1525 07/29/16 0239 07/31/16 0520  NA 134* 137 135 138 134*  K 4.3 4.6 4.5 3.7 3.8  CL 92* 99* 97* 99* 98*  CO2 26 26 23 30 27   GLUCOSE 147* 97 112* 125* 111*  BUN 47* 53* 64* 15 11  CREATININE 7.99* 9.11* 10.68* 4.28* 4.27*  CALCIUM 8.8* 8.1* 7.3* 7.8* 8.4*  PHOS  --  6.7* 5.9*  --   --    Liver Function Tests:  Recent Labs Lab 07/27/16 1640 07/28/16 1525  ALBUMIN 2.8* 2.8*   No results for input(s): LIPASE, AMYLASE in the last 168 hours. No results for input(s): AMMONIA in the last 168 hours. CBC:  Recent Labs Lab 07/26/16 1050  07/27/16 0355  07/27/16 1640  07/28/16 0817  07/29/16 0239 07/29/16 0835 07/29/16 1723 07/29/16 2013 07/30/16 0227 07/31/16 0520  WBC 10.9*  --  11.7*  --  9.8  --  7.8  --  6.7  --   --   --   --  7.2  NEUTROABS 10.4*  --  10.2*  --   --   --   --   --   --   --   --   --   --   --   HGB 10.4*  < > 8.6*  < > 8.1*  < > 7.4*  < > 9.8* 9.8* 11.0* 10.0* 10.2* 10.2*  HCT 33.0*  < > 27.2*  < > 25.8*  < > 23.8*  < > 30.2* 30.6* 34.0* 30.9* 31.6* 32.0*  MCV 100.3*  --  100.7*  --  100.0  --  100.8*  --  95.9  --   --   --   --  97.6  PLT 366  --  303  --  258  --  214  --  238  --   --   --   --  195  < > = values in this interval not displayed. Cardiac Enzymes:  Recent Labs Lab 07/27/16 0355 07/28/16 0232 07/28/16 0817 07/28/16 1411  TROPONINI 1.52* 2.59* 2.08* 1.14*   BNP (last 3 results)  Recent Labs  07/16/16 1517  BNP 372.0*    ProBNP (last 3 results) No results for input(s): PROBNP in the last 8760 hours.  CBG: No results for input(s): GLUCAP in the last 168 hours.  Recent Results (from the past 240 hour(s))  MRSA PCR Screening     Status: None   Collection Time: 07/27/16  2:29 PM  Result Value Ref Range Status   MRSA by PCR NEGATIVE NEGATIVE Final    Comment:        The GeneXpert MRSA Assay (FDA approved for NASAL specimens only), is one component of a comprehensive MRSA  colonization surveillance program. It is not intended to diagnose MRSA infection nor to guide  or monitor treatment for MRSA infections.      Studies: No results found.  Scheduled Meds: . sodium chloride   Intravenous Once  . acetaminophen  1,000 mg Oral Once  . amiodarone  200 mg Oral Daily  . amLODipine  2.5 mg Oral Daily  . aspirin EC  81 mg Oral Daily  . cinacalcet  30 mg Oral Q supper  . cloNIDine  0.2 mg Oral TID  . darbepoetin (ARANESP) injection - DIALYSIS  100 mcg Intravenous Q Sat-HD  . doxercalciferol  1.5 mcg Intravenous Q T,Th,Sa-HD  . fluticasone  2 spray Each Nare Daily  . [START ON 08/03/2016] heparin  5,000 Units Subcutaneous Q8H  . hydrALAZINE  50 mg Oral Q8H  . isosorbide mononitrate  90 mg Oral Daily  . lanthanum  1,000 mg Oral TID WC  . metoprolol tartrate  12.5 mg Oral BID  . multivitamin  1 tablet Oral Daily  . pantoprazole  40 mg Oral Daily  . simvastatin  20 mg Oral QHS  . sodium chloride flush  3 mL Intravenous Q12H   Continuous Infusions: . sodium chloride      Principal Problem:   Hypertensive emergency Active Problems:   HLD (hyperlipidemia)   Mitral regurgitation   Anemia of chronic kidney failure   Acute blood loss anemia   Chronic diastolic CHF (congestive heart failure) (HCC)   Essential hypertension   Acute on chronic diastolic CHF (congestive heart failure) (HCC)   Hypertensive crisis   ACS (acute coronary syndrome) ([place])   ESRD (end stage renal disease) (HCC)   Heme positive stool    Time spent: >35 minutes     Kinnie Feil  [facility] Pager [phone]. If 7PM-7AM, please contact night-coverage at [email or web address], password [facility] 08/01/2016, 3:00 PM  LOS: 5 days

## 2016-08-01 NOTE — Care Management Important Message (Signed)
Important Message  Patient Details  Name: Shawna [name] MRN: [id number] Date of Birth: [birth year]   Medicare Important Message Given:  Yes    [name] May 08/01/2016, 1:46 PM

## 2016-08-01 NOTE — Progress Notes (Signed)
Progress Note  Patient Name: Shawna [name] Date of Encounter: 08/01/2016  Primary Cardiologist: Dr. Bronson Ing   Subjective   Denies chest pain. She is unsure that she wants to pursue heart cath.   Inpatient Medications    Scheduled Meds: . sodium chloride   Intravenous Once  . acetaminophen  1,000 mg Oral Once  . amiodarone  200 mg Oral Daily  . amLODipine  2.5 mg Oral Daily  . aspirin EC  81 mg Oral Daily  . cinacalcet  30 mg Oral Q supper  . cloNIDine  0.2 mg Oral TID  . darbepoetin (ARANESP) injection - DIALYSIS  100 mcg Intravenous Q Sat-HD  . doxercalciferol  1.5 mcg Intravenous Q T,Th,Sa-HD  . fluticasone  2 spray Each Nare Daily  . heparin  5,000 Units Subcutaneous Q8H  . hydrALAZINE  50 mg Oral Q8H  . isosorbide mononitrate  90 mg Oral Daily  . lanthanum  1,000 mg Oral TID WC  . metoprolol tartrate  12.5 mg Oral BID  . multivitamin  1 tablet Oral Daily  . pantoprazole  40 mg Oral Daily  . simvastatin  20 mg Oral QHS  . sodium chloride flush  3 mL Intravenous Q12H   Continuous Infusions: . sodium chloride     PRN Meds: sodium chloride, sodium chloride, albuterol, ALPRAZolam, alteplase, heparin, lidocaine (PF), lidocaine-prilocaine, menthol-cetylpyridinium, pentafluoroprop-tetrafluoroeth, sodium chloride flush   Vital Signs    Vitals:   07/31/16 1616 07/31/16 2006 07/31/16 2348 08/01/16 0549  BP: (!) 162/65 (!) 145/60 (!) 157/65 (!) 124/52  Pulse:  78 67 67  Resp: (!) 22 19 17 18   Temp:  99.8 F (37.7 C) 99.6 F (37.6 C) 99 F (37.2 C)  TempSrc:  Oral Oral Oral  SpO2:  95% 97% 96%  Weight:    125 lb 14.1 oz (57.1 kg)  Height:        Intake/Output Summary (Last 24 hours) at 08/01/16 0829 Last data filed at 07/31/16 1300  Gross per 24 hour  Intake              120 ml  Output                0 ml  Net              120 ml   Filed Weights   07/30/16 1904 07/31/16 0500 08/01/16 0549  Weight: 123 lb 14.4 oz (56.2 kg) 122 lb 14.4 oz (55.7 kg) 125 lb  14.1 oz (57.1 kg)    Telemetry    NSR - Personally Reviewed  ECG    NSR with LVH repolarization  - Personally Reviewed  Physical Exam   GEN: No acute distress.  Neck: No JVD, right IJ HD cath in place.  Cardiac: RRR, no murmurs, rubs, or gallops.  Respiratory: Clear to auscultation bilaterally. GI: Soft, nontender, non-distended  MS: No edema; No deformity. Neuro:  AAOx3. Psych: Normal affect  Labs    Chemistry Recent Labs Lab 07/27/16 1640 07/28/16 1525 07/29/16 0239 07/31/16 0520  NA 137 135 138 134*  K 4.6 4.5 3.7 3.8  CL 99* 97* 99* 98*  CO2 26 23 30 27   GLUCOSE 97 112* 125* 111*  BUN 53* 64* 15 11  CREATININE 9.11* 10.68* 4.28* 4.27*  CALCIUM 8.1* 7.3* 7.8* 8.4*  ALBUMIN 2.8* 2.8*  --   --   GFRNONAA 4* 3* 9* 9*  GFRAA 4* 4* 11* 11*  ANIONGAP 12 15 9  9  Hematology Recent Labs Lab 07/28/16 0817 07/28/16 1411  07/29/16 0239  07/29/16 2013 07/30/16 0227 07/31/16 0520  WBC 7.8  --   --  6.7  --   --   --  7.2  RBC 2.36* 2.63*  --  3.15*  --   --   --  3.28*  HGB 7.4* 8.3*  < > 9.8*  < > 10.0* 10.2* 10.2*  HCT 23.8* 26.4*  < > 30.2*  < > 30.9* 31.6* 32.0*  MCV 100.8*  --   --  95.9  --   --   --  97.6  MCH 31.4  --   --  31.1  --   --   --  31.1  MCHC 31.1  --   --  32.5  --   --   --  31.9  RDW 18.1*  --   --  19.1*  --   --   --  17.5*  PLT 214  --   --  238  --   --   --  195  < > = values in this interval not displayed.  Cardiac Enzymes Recent Labs Lab 07/27/16 0355 07/28/16 0232 07/28/16 0817 07/28/16 1411  TROPONINI 1.52* 2.59* 2.08* 1.14*     Radiology    No results found.  Cardiac Studies  Transthoracic Echocardiography 07/28/16 Study Conclusions  - Left ventricle: Septal and apical hypokinesis. The cavity size   was normal. Wall thickness was normal. Systolic function was   normal. The estimated ejection fraction was in the range of 50%   to 55%. Doppler parameters are consistent with both elevated   ventricular  end-diastolic filling pressure and elevated left   atrial filling pressure. - Mitral valve: There was mild regurgitation. Valve area by   continuity equation (using LVOT flow): 1.49 cm^2. - Left atrium: The atrium was moderately dilated. - Atrial septum: No defect or patent foramen ovale was identified. - Pulmonary arteries: PA peak pressure: 54 mm Hg (S).   MYOCARDIAL IMAGING WITH SPECT (REST AND PHARMACOLOGIC-STRESS) IMPRESSION: 1. There is a primarily fixed defect in the anterior wall and apex which is moderate in size. A small portion of it towards the base is reversible.  2. Global hypokinesis.  3. Left ventricular ejection fraction 41%  4. Non invasive risk stratification*: Intermediate risk.    Patient Profile     76 y.o. female with a past medical history of CAD s/p CABG in 2013. She presented on 07/27/16 with chest pain and hypertensive urgency. Also with ESRD on HD.   Last cardiac catheterization performed on 12/10/2013 showed a patent left main, focal disease in the mid to distal LAD, patent diagonal, patent LIMA to LAD, severe stenosis in proximal left circumflex, patent SVG to left circumflex, 70% proximal ramus stenosis, patent SVG to ramus, occluded stent to ostium of RCA and finely patent SVG to distal RCA. She had a normal Myoview on 08/25/2014.  On 05/11/2016, she had a PEA arrest requiring CPR along with episodes of bradycardia. Afterward, she did require intubation as well. This occurred in the setting of hyperkalemia with potassium 6.4 on epi drip. Since then she does occasional chest pain with palpation.   Assessment & Plan   1. NSTEMI: Troponin 1.52>>2.59>>2.08>>1.14. In the setting of hypertensive urgency and ESRD but with history of CAD s/p CABG. Last heart cath showed patent grafts. However, she had an abnormal Myoview this admission, and plan was for heart cath, however patient is unsure today that  she wants to go through with a heart cath. She wants to talk  to Dr. Martinique first.   2. Hypertension: Hypertensive, note from yesterday says to increase amlodipine to 10mg , she is on 2.5mg  already. Will increase to 10mg  today.   3. ESRD: In need of tunnled HD catheter placement, but with NSTEMI, IR is holding off until cardiac work up is complete.   Hx GI bleed - heme+ stool (1 unit PRBC base Hg 7.4)  - S/p enteroscopy yesterday with APC ablation of jejunal AVM which could have caused her symptoms. Dr. Havery Moros  Signed, Arbutus Leas, NP  08/01/2016, 8:29 AM   Patient seen and examined and history reviewed. Agree with above findings and plan. I spent 35 minutes reviewing this patient's history, cardiac studies and discussing results with the patient and her husband. She is a 73 yo BF with history of CAD s/p CABG, ESRD on hemodialysis, severe HTN with recurrent hypertensive crises. She presented with chest pain and hypertensive crisis. Ecg without acute change. Troponin peak at 2.59. She also had GI bleed with Hgb down to 7.4. She has a history of contrast allergy. She has no IV access except for her dialysis catheter.  Currently she denies any Chest pain or SOB. She underwent cardiac cath in 2015 which showed patent grafts. Myoview in 2016 was normal. Echo this admission shows LVH with normal systolic function (some septal dyssynergy due to LBBB). Myoview shows a fixed anterior wall defect with EF 41%.  Clinically I suspect her troponin elevation is due to demand ischemia in setting of hypertensive crisis and GI bleed. The Myoview results are concerning for infarct but results are discordant with the Echo results that show normal LV function. We discussed options at this point including continued medical therapy versus cardiac cath. At this point cardiac cath would be diagnostic only since if she had a lesion that needed stenting we could not proceed at this point with recent GIB and cautery of AVM. After extensive discussion we decided at this point that the  most prudent approach would be to continue medical therapy with aggressive BP control. If she has recurrent chest pain despite good medical therapy then we could justify risk of more invasive approach. The patient and her husband are in agreement with this approach. She will follow up with Dr. Bronson Ing as an outpatient.   Peter Martinique, [place] 08/01/2016 9:43 AM

## 2016-08-01 NOTE — Anesthesia Postprocedure Evaluation (Signed)
Anesthesia Post Note  Patient: Shawna [name]  Procedure(s) Performed: Procedure(s) (LRB): ESOPHAGOGASTRODUODENOSCOPY (EGD) (N/A)  Patient location during evaluation: PACU Anesthesia Type: General Level of consciousness: awake Pain management: pain level controlled Vital Signs Assessment: post-procedure vital signs reviewed and stable Respiratory status: spontaneous breathing Cardiovascular status: stable Anesthetic complications: no       Last Vitals:  Vitals:   08/01/16 1758 08/01/16 1934  BP: 128/64 (!) 142/66  Pulse: 69 71  Resp: 19 17  Temp: 37.3 C 36.9 C    Last Pain:  Vitals:   08/01/16 1934  TempSrc: Oral  PainSc:                  Antinio Sanderfer

## 2016-08-02 ENCOUNTER — Encounter (HOSPITAL_COMMUNITY): Payer: Self-pay | Admitting: Interventional Radiology

## 2016-08-02 ENCOUNTER — Inpatient Hospital Stay (HOSPITAL_COMMUNITY): Payer: Medicare HMO

## 2016-08-02 DIAGNOSIS — I248 Other forms of acute ischemic heart disease: Secondary | ICD-10-CM | POA: Diagnosis not present

## 2016-08-02 DIAGNOSIS — N186 End stage renal disease: Secondary | ICD-10-CM | POA: Diagnosis not present

## 2016-08-02 DIAGNOSIS — N2581 Secondary hyperparathyroidism of renal origin: Secondary | ICD-10-CM | POA: Diagnosis not present

## 2016-08-02 DIAGNOSIS — Z452 Encounter for adjustment and management of vascular access device: Secondary | ICD-10-CM

## 2016-08-02 DIAGNOSIS — I169 Hypertensive crisis, unspecified: Secondary | ICD-10-CM | POA: Diagnosis not present

## 2016-08-02 DIAGNOSIS — Z4901 Encounter for fitting and adjustment of extracorporeal dialysis catheter: Secondary | ICD-10-CM | POA: Diagnosis not present

## 2016-08-02 DIAGNOSIS — Z992 Dependence on renal dialysis: Secondary | ICD-10-CM | POA: Diagnosis not present

## 2016-08-02 DIAGNOSIS — I161 Hypertensive emergency: Secondary | ICD-10-CM | POA: Diagnosis not present

## 2016-08-02 DIAGNOSIS — I249 Acute ischemic heart disease, unspecified: Secondary | ICD-10-CM | POA: Diagnosis not present

## 2016-08-02 DIAGNOSIS — N185 Chronic kidney disease, stage 5: Secondary | ICD-10-CM | POA: Diagnosis not present

## 2016-08-02 DIAGNOSIS — D62 Acute posthemorrhagic anemia: Secondary | ICD-10-CM | POA: Diagnosis not present

## 2016-08-02 DIAGNOSIS — I5032 Chronic diastolic (congestive) heart failure: Secondary | ICD-10-CM | POA: Diagnosis not present

## 2016-08-02 DIAGNOSIS — I120 Hypertensive chronic kidney disease with stage 5 chronic kidney disease or end stage renal disease: Secondary | ICD-10-CM | POA: Diagnosis not present

## 2016-08-02 DIAGNOSIS — I5033 Acute on chronic diastolic (congestive) heart failure: Secondary | ICD-10-CM | POA: Diagnosis not present

## 2016-08-02 DIAGNOSIS — D631 Anemia in chronic kidney disease: Secondary | ICD-10-CM | POA: Diagnosis not present

## 2016-08-02 HISTORY — PX: IR GENERIC HISTORICAL: IMG1180011

## 2016-08-02 LAB — PROTIME-INR
INR: 1.11
Prothrombin Time: 14.3 seconds (ref 11.4–15.2)

## 2016-08-02 IMAGING — US IR FLUORO GUIDE CV LINE*R*
2 series · 2 of 2 positions shown · non-contrast
Comparison: none

INDICATION: End-stage renal disease, no current permanent access

[Series 1: ir fluoro/shunt/fist · 1 of 1 slices shown]
[im 1/1]
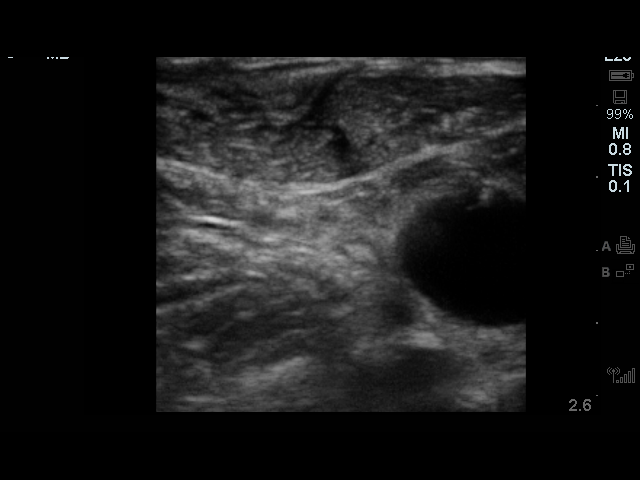

[Series 2: fl (-) angio · 1 of 1 slices shown]
[im 1/1]
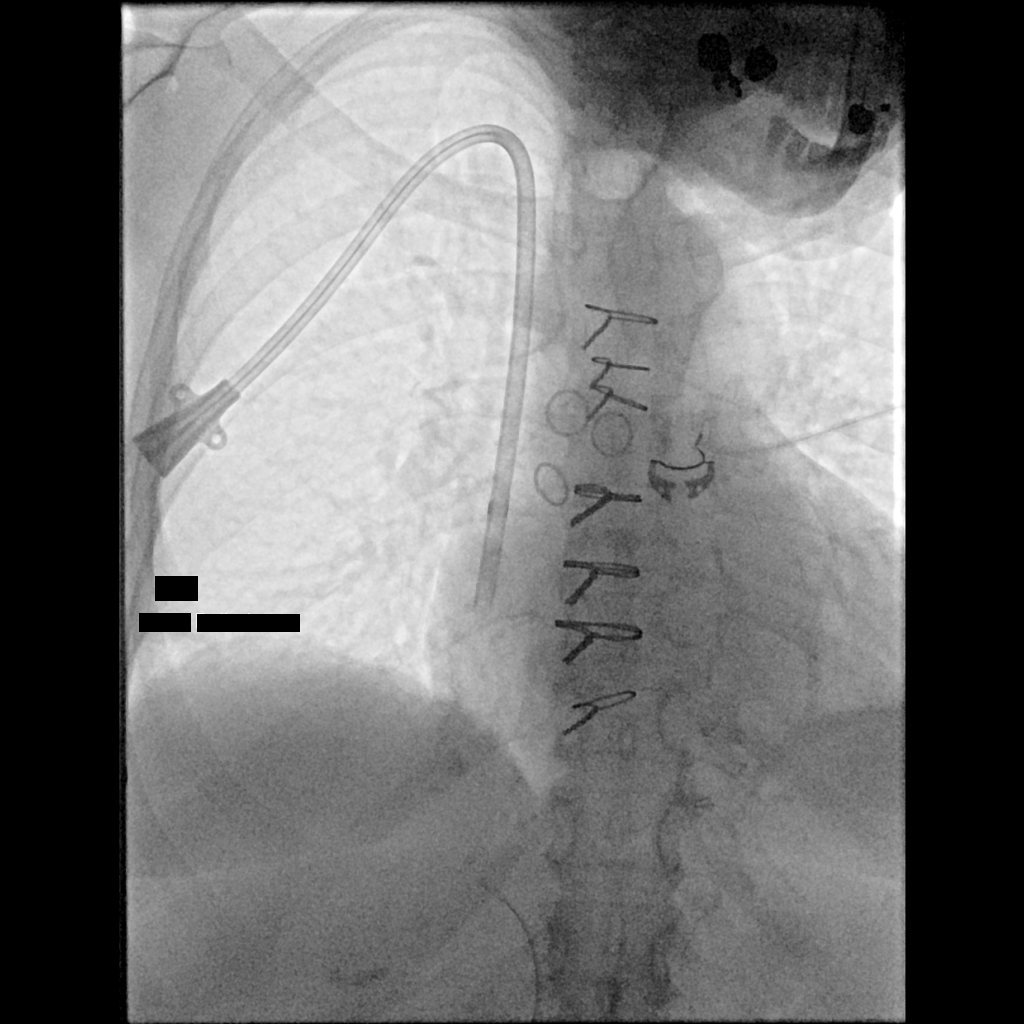

[2 of 2 positions shown; findings below may reference images not displayed]

EXAM:
ULTRASOUND GUIDANCE FOR VASCULAR ACCESS

RIGHT INTERNAL JUGULAR PERMANENT HEMODIALYSIS CATHETER

Date:  [DATE] [DATE]

Radiologist:  CIANURO

Guidance:  Ultrasound and fluoroscopic

FLUOROSCOPY TIME:  Fluoroscopy Time:  2 seconds (4 mGy).

MEDICATIONS:
Vancomycin 1 gm IV, administered within 1 hour of the procedure

ANESTHESIA/SEDATION:
Versed 1 mg IV; Fentanyl 50 mcg IV;

Moderate Sedation Time:  31 minutes

The patient was continuously monitored during the procedure by the
interventional radiology nurse under my direct supervision.

CONTRAST:  None.

COMPLICATIONS:
None immediate.

PROCEDURE:
Informed consent was obtained from the patient following explanation
of the procedure, risks, benefits and alternatives. The patient
understands, agrees and consents for the procedure. All questions
were addressed. A time out was performed.

Maximal barrier sterile technique utilized including caps, mask,
sterile gowns, sterile gloves, large sterile drape, hand hygiene,
and 2% chlorhexidine scrub.

Under sterile conditions and local anesthesia, right internal
jugular micropuncture venous access was performed with ultrasound.
Images were obtained for documentation. A guide wire was inserted
followed by a transitional dilator. Next, a 0.035 guidewire was
advanced into the IVC with a 5-French catheter. Measurements were
obtained from the right venotomy site to the proximal right atrium.
In the right infraclavicular chest, a subcutaneous tunnel was
created under sterile conditions and local anesthesia. 1% lidocaine
with epinephrine was utilized for this. The 19 cm tip to cuff
dialysis catheter was tunneled subcutaneously to the venotomy site
and inserted into the SVC/RA junction through a valved peel-away
sheath. Position was confirmed with fluoroscopy. Images were
obtained for documentation. Blood was aspirated from the catheter
followed by saline and heparin flushes. The appropriate volume and
strength of heparin was instilled in each lumen. Caps were applied.
The catheter was secured at the tunnel site with Gelfoam and a
pursestring suture. The venotomy site was closed with subcuticular
Vicryl suture. Dermabond was applied to the small right neck
incision. A dry sterile dressing was applied. The catheter is ready
for use. No immediate complications.
IMPRESSION: Ultrasound and fluoroscopically guided right internal jugular
tunneled hemodialysis catheter (19 cm tip to cuff dialysis
catheter).

## 2016-08-02 MED ORDER — NITROGLYCERIN 0.4 MG SL SUBL: 0.4000 mg | SUBLINGUAL_TABLET | SUBLINGUAL | 1 refills | Status: DC | PRN

## 2016-08-02 MED ORDER — AMLODIPINE BESYLATE 10 MG PO TABS: 10.0000 mg | ORAL_TABLET | Freq: Every day | ORAL | Status: DC

## 2016-08-02 MED ORDER — DIPHENHYDRAMINE HCL 50 MG/ML IJ SOLN
INTRAMUSCULAR | Status: AC | PRN
  Administered 2016-08-02: 25 mg via INTRAVENOUS

## 2016-08-02 MED ORDER — ISOSORBIDE MONONITRATE ER 30 MG PO TB24: 90.0000 mg | ORAL_TABLET | Freq: Every day | ORAL | 0 refills | Status: DC

## 2016-08-02 MED ORDER — LIDOCAINE HCL (PF) 1 % IJ SOLN
INTRAMUSCULAR | Status: AC
  Filled 2016-08-02: qty 10

## 2016-08-02 MED ORDER — PANTOPRAZOLE SODIUM 40 MG PO TBEC: 40.0000 mg | DELAYED_RELEASE_TABLET | Freq: Every day | ORAL | 1 refills | Status: DC

## 2016-08-02 MED ORDER — GELATIN ABSORBABLE 12-7 MM EX MISC
CUTANEOUS | Status: AC
  Filled 2016-08-02: qty 1

## 2016-08-02 MED ORDER — HEPARIN SODIUM (PORCINE) 1000 UNIT/ML IJ SOLN
INTRAMUSCULAR | Status: AC
  Filled 2016-08-02: qty 1

## 2016-08-02 MED ORDER — DIPHENHYDRAMINE HCL 50 MG/ML IJ SOLN
INTRAMUSCULAR | Status: AC
  Filled 2016-08-02: qty 1

## 2016-08-02 MED ORDER — MIDAZOLAM HCL 2 MG/2ML IJ SOLN
INTRAMUSCULAR | Status: AC | PRN
  Administered 2016-08-02: 1 mg via INTRAVENOUS

## 2016-08-02 MED ORDER — VANCOMYCIN HCL IN DEXTROSE 1-5 GM/200ML-% IV SOLN
INTRAVENOUS | Status: AC
  Filled 2016-08-02: qty 200

## 2016-08-02 MED ORDER — MIDAZOLAM HCL 2 MG/2ML IJ SOLN
INTRAMUSCULAR | Status: AC
  Filled 2016-08-02: qty 2

## 2016-08-02 MED ORDER — METOPROLOL TARTRATE 25 MG PO TABS: 12.5000 mg | ORAL_TABLET | Freq: Two times a day (BID) | ORAL | 0 refills | Status: DC

## 2016-08-02 MED ORDER — FENTANYL CITRATE (PF) 100 MCG/2ML IJ SOLN
INTRAMUSCULAR | Status: AC
  Filled 2016-08-02: qty 2

## 2016-08-02 MED ORDER — AMLODIPINE BESYLATE 10 MG PO TABS: 10.0000 mg | ORAL_TABLET | Freq: Every day | ORAL | 0 refills | Status: DC

## 2016-08-02 MED ORDER — FENTANYL CITRATE (PF) 100 MCG/2ML IJ SOLN
INTRAMUSCULAR | Status: AC | PRN
  Administered 2016-08-02 (×2): 25 ug via INTRAVENOUS

## 2016-08-02 MED ORDER — LIDOCAINE HCL 1 % IJ SOLN
INTRAMUSCULAR | Status: AC | PRN
  Administered 2016-08-02: 15 mL

## 2016-08-02 NOTE — Sedation Documentation (Signed)
Patient is resting comfortably. 

## 2016-08-02 NOTE — Evaluation (Signed)
Physical Therapy Evaluation Patient Details Name: Shawna [name] MRN: [id number] DOB: 1941/06/21 Today's Date: 08/02/2016   History of Present Illness  y.o.femalewith hx of ESRD on HD, clotted AVF on her left upper arm, s/p tunnel catheter placement 1/16, receiving HD urgently prior to admission for hyperkalemia, hx of severe HTN, hx of GI bleed, AVM, Carotid disease, HLD, chronic diastolic CHF, presented to the [facility] ER as she was found to be severely HTN at home with SBP 250 and had retrosternal Chest pain. Noted to have elevated troponin and therefore transferred from [facility] to [facility]. Evaluated by Cardiology: ?demand ischemia on admission. Cardiology recommended medical management. Also, found to have Heme + stool and received Protonix infusion. Underwent endoscopy found to have AVM. Awaiting IR tunneled cath placement   Clinical Impression  Pt was amb with cane PTA now requiring use of RW. Pt with report of sore feet and increased fatigue due to week long stay in the bed. Pt to benefit from HHPT to improve strength and endurance to return to amb with cane and improve independence with mobility.    Follow Up Recommendations Home health PT;Supervision/Assistance - 24 hour    Equipment Recommendations  None recommended by PT    Recommendations for Other Services       Precautions / Restrictions Precautions Precautions: Fall Restrictions Weight Bearing Restrictions: No      Mobility  Bed Mobility Overal bed mobility: Needs Assistance Bed Mobility: Supine to Sit     Supine to sit: Supervision     General bed mobility comments: increased time, hob elevated   Transfers Overall transfer level: Needs assistance Equipment used: Rolling walker (2 wheeled) Transfers: Sit to/from Stand Sit to Stand: Min guard         General transfer comment: v/c's to push up from bed, min guard to steady during transfer of hands from bed to  walker  Ambulation/Gait Ambulation/Gait assistance: Min guard Ambulation Distance (Feet): 180 Feet Assistive device: Rolling walker (2 wheeled) Gait Pattern/deviations: Step-through pattern;Trunk flexed;Decreased stride length Gait velocity: slow Gait velocity interpretation: Below normal speed for age/gender General Gait Details: v/c's to stay in walker, 2 standing rest breaks  Stairs Stairs:  (discussed technique, pt however declined stairs)          Wheelchair Mobility    Modified Rankin (Stroke Patients Only)       Balance Overall balance assessment: Needs assistance Sitting-balance support: Feet supported;No upper extremity supported Sitting balance-Leahy Scale: Fair     Standing balance support: Bilateral upper extremity supported Standing balance-Leahy Scale: Fair Standing balance comment: using RW at this time                             Pertinent Vitals/Pain Pain Assessment: No/denies pain    Home Living Family/patient expects to be discharged to:: Private residence Living Arrangements: Spouse/significant other Available Help at Discharge: Family;Available 24 hours/day Type of Home: House Home Access: Stairs to enter Entrance Stairs-Rails: Left Entrance Stairs-Number of Steps: 3 steps Home Layout: One level Home Equipment: Walker - 2 wheels;Cane - single point;Shower seat      Prior Function Level of Independence: Independent with assistive device(s)         Comments: uses straight cane, spouse assist pt on stairs     Hand Dominance   Dominant Hand: Right    Extremity/Trunk Assessment   Upper Extremity Assessment Upper Extremity Assessment: Overall WFL for tasks assessed  Lower Extremity Assessment Lower Extremity Assessment: Generalized weakness    Cervical / Trunk Assessment Cervical / Trunk Assessment: Normal  Communication   Communication: No difficulties  Cognition Arousal/Alertness: Awake/alert Behavior During  Therapy: WFL for tasks assessed/performed Overall Cognitive Status: Within Functional Limits for tasks assessed                      General Comments      Exercises     Assessment/Plan    PT Assessment Patient needs continued PT services  PT Problem List Decreased strength;Decreased activity tolerance;Decreased balance;Decreased mobility          PT Treatment Interventions DME instruction;Gait training;Stair training;Functional mobility training;Therapeutic activities;Therapeutic exercise;Balance training    PT Goals (Current goals can be found in the Care Plan section)  Acute Rehab PT Goals Patient Stated Goal: home today PT Goal Formulation: With patient Time For Goal Achievement: 08/09/16 Potential to Achieve Goals: Good    Frequency Min 3X/week   Barriers to discharge        Co-evaluation               End of Session Equipment Utilized During Treatment: Gait belt Activity Tolerance: Patient tolerated treatment well Patient left: in chair;with call bell/phone within reach;with family/visitor present Nurse Communication: Mobility status         Time: 7262-0355 PT Time Calculation (min) (ACUTE ONLY): 21 min   Charges:   PT Evaluation $PT Eval Moderate Complexity: 1 Procedure     PT G Codes:        Shawna [name] 08/02/2016, 2:44 PM   Shawna [name], PT, DPT Pager #: [phone] Office #: [phone]

## 2016-08-02 NOTE — Discharge Summary (Signed)
Physician Discharge Summary  Shawna [name] BTD:[id number] DOB: Feb 10, 1941 DOA: 07/27/2016  PCP: [name], MD  Admit date: 07/27/2016 Discharge date: 08/02/2016  Time spent: >35 minutes  Recommendations for Outpatient Follow-up:  HD as scheduled Cardiology in 2-3 weeks GI in 3-4 weeks  Discharge Diagnoses:  Principal Problem:   Hypertensive emergency Active Problems:   HLD (hyperlipidemia)   Mitral regurgitation   Anemia of chronic kidney failure   Acute blood loss anemia   Chronic diastolic CHF (congestive heart failure) (HCC)   Essential hypertension   Acute on chronic diastolic CHF (congestive heart failure) (HCC)   Hypertensive crisis   Demand ischemia ([name])   ESRD (end stage renal disease) ([name])   Heme positive stool   Discharge Condition: stable   Diet recommendation: renal   Filed Weights   08/01/16 1950 08/01/16 2330 08/02/16 0344  Weight: 58.1 kg (128 lb 1.4 oz) 57 kg (125 lb 10.6 oz) 58 kg (127 lb 13.9 oz)    History of present illness:  76 y.o.femalewith hx of ESRD on HD, clotted AVF on her left upper arm, s/p tunnel catheter placement 1/16, receiving HD urgently prior to admission for hyperkalemia, hx of severe HTN, hx of GI bleed, AVM, Carotid disease, HLD, chronic diastolic CHF, presented to the [facility] ER as she was found to be severely HTN at home with SBP 250 and had retrosternal Chest pain. Noted to have elevated troponin and therefore transferred from [facility] to [facility]. Evaluated by Cardiology: ?demand ischemia on admission. Cardiology recommended medical management. Also, found to have Heme + stool and received Protonix infusion. Underwent endoscopy found to have AVM.    Hospital Course:   Hypertensive emergency. may have been related to inability to be dialyzed on her usual day -BP improved, adjusted meds: Norvasc, Imdur, Metoprolol and Clonidine  Probable ACS vs demand ischemia on admission. Troponin 1.52>>2.59>>2.08>>1.14. CAD  s/p CABG in 2013. likely secondary to HTN, fluid overload. ECHO shows septal and apical hypokinesia with preserved EF. Myoview is + for apical scar with superimposed reversible ischemia -cardiology d/w patient at length recommended medical management for now in the setting of acute GI bleeding. Cancelled LHC. Cont ASA, statin. Outpatient follow up in 2 weeks  Hemoccult + stool. EGD in 9/14 >> Non bleeding Duodenal AVM. repeat EGD showed an AVM in prox jejunum which was ablated -no new episodes of bleeding. due to acute MI, will not hold Aspirin. GI plans to f/u as outpt  Anemia of chronic kidney failure. heme + as well on admission. . Received 1 u PRBC 1/18 for Hb 7.4 in setting of ACS- Hb now 10.2  ESRD. with LUE fistula malfunction on Monday. underwent  tunneled dialysis cath placed by IR on 1/23. Cont outpatient HD, follow up   Chronic diastolic CHF (congestive heart failure) ([name]). grade 2 d CHF per ECHO in 3/17.  fluid management with dialysis   Procedures:  HD cath placement   EGD (i.e. Studies not automatically included, echos, thoracentesis, etc; not x-rays)  Consultations:  GI  Cardiology  Nephrology   Discharge Exam: Vitals:   08/02/16 1620 08/02/16 1703  BP: (!) 155/61 (!) 157/72  Pulse: 66 68  Resp: 17 18  Temp:  98.6 F (37 C)    General: alert, no distress Cardiovascular: s1,s2 rrr Respiratory: CTA BL  Discharge Instructions  Discharge Instructions    Diet - low sodium heart healthy    Complete by:  As directed    Increase activity slowly  Complete by:  As directed      Allergies as of 08/02/2016      Reactions   Aspirin Other (See Comments)   Mess up her stomach; "makes my bowels have blood in them". Takes 81 mg EC Aspirin    Contrast Media [iodinated Diagnostic Agents] Itching   Iron Itching, Other (See Comments)   "they gave me iron in dialysis; had to give me Benadryl cause I had to have the iron" (05/02/2012)   Penicillins Other (See  Comments)      Plavix [clopidogrel Bisulfate] Rash   Amlodipine Swelling, Other (See Comments)   Leg swelling   Bactrim [sulfamethoxazole-trimethoprim] Rash   Dexilant [dexlansoprazole] Other (See Comments)   Upset stomach   Morphine And Related Itching   Itching in feet   Nitrofurantoin Hives   Prilosec [omeprazole] Other (See Comments)   "back spasms"   Venofer [ferric Oxide] Itching   Patient reports using Benadryl prior to doses as [place]   Tylenol [acetaminophen] Other (See Comments)   Makes her feet on fire per pt   Levaquin [levofloxacin In D5w] Rash   Protonix [pantoprazole Sodium] Rash      Medication List    STOP taking these medications   omeprazole 20 MG capsule Commonly known as:  PRILOSEC     TAKE these medications   ALPRAZolam 0.25 MG tablet Commonly known as:  XANAX Take 0.25-0.5 mg by mouth at bedtime as needed for sleep.   amiodarone 200 MG tablet Commonly known as:  PACERONE Take 1 tablet (200 mg total) by mouth daily.   amLODipine 10 MG tablet Commonly known as:  NORVASC Take 1 tablet (10 mg total) by mouth daily. What changed:  medication strength  how much to take   aspirin EC 81 MG tablet Take 81 mg by mouth daily.   cloNIDine 0.2 MG tablet Commonly known as:  CATAPRES Take 1 tablet (0.2 mg total) by mouth 3 (three) times daily.   epoetin alfa 10000 UNIT/ML injection Commonly known as:  EPOGEN,PROCRIT Inject 1 mL (10,000 Units total) into the vein every Monday, Wednesday, and Friday with hemodialysis.   fluticasone 50 MCG/ACT nasal spray Commonly known as:  FLONASE Place 2 sprays into the nose daily as needed for allergies.   hydrALAZINE 50 MG tablet Commonly known as:  APRESOLINE Take 1 tablet (50 mg total) by mouth every 8 (eight) hours.   isosorbide mononitrate 30 MG 24 hr tablet Commonly known as:  IMDUR Take 3 tablets (90 mg total) by mouth daily. Start taking on:  08/03/2016 What changed:  medication  strength  how much to take   lanthanum 1000 MG chewable tablet Commonly known as:  FOSRENOL Chew 1 tablet (1,000 mg total) by mouth 3 (three) times daily with meals.   metoprolol tartrate 25 MG tablet Commonly known as:  LOPRESSOR Take 0.5 tablets (12.5 mg total) by mouth 2 (two) times daily.   multivitamin Tabs tablet Take 1 tablet by mouth daily.   nitroGLYCERIN 0.4 MG SL tablet Commonly known as:  NITROSTAT Place 1 tablet (0.4 mg total) under the tongue every 5 (five) minutes as needed for chest pain. What changed:  Another medication with the same name was removed. Continue taking this medication, and follow the directions you see here.   pantoprazole 40 MG tablet Commonly known as:  PROTONIX Take 1 tablet (40 mg total) by mouth daily. Start taking on:  08/03/2016   PROAIR HFA 108 (90 Base) MCG/ACT inhaler Generic drug:  albuterol Inhale 1 puff into the lungs every 6 (six) hours as needed for wheezing or shortness of breath.   SENSIPAR 30 MG tablet Generic drug:  cinacalcet Take 30 mg by mouth daily with supper.   simvastatin 20 MG tablet Commonly known as:  ZOCOR Take 1 tablet (20 mg total) by mouth at bedtime.      Allergies  Allergen Reactions  . Aspirin Other (See Comments)    Mess up her stomach; "makes my bowels have blood in them". Takes 81 mg EC Aspirin   . Contrast Media [Iodinated Diagnostic Agents] Itching  . Iron Itching and Other (See Comments)    "they gave me iron in dialysis; had to give me Benadryl cause I had to have the iron" (05/02/2012)  . Penicillins Other (See Comments)      . Plavix [Clopidogrel Bisulfate] Rash  . Amlodipine Swelling and Other (See Comments)    Leg swelling  . Bactrim [Sulfamethoxazole-Trimethoprim] Rash  . Dexilant [Dexlansoprazole] Other (See Comments)    Upset stomach  . Morphine And Related Itching    Itching in feet  . Nitrofurantoin Hives  . Prilosec [Omeprazole] Other (See Comments)    "back spasms"  .  Venofer [Ferric Oxide] Itching    Patient reports using Benadryl prior to doses as [facility]  . Tylenol [Acetaminophen] Other (See Comments)    Makes her feet on fire per pt  . Levaquin [Levofloxacin In D5w] Rash  . Protonix [Pantoprazole Sodium] Rash   Follow-up Information    [facility] Follow up.   Why:  Registered Nurse, Physical Therapy and Occupational Therapy Contact information: [phone]           The results of significant diagnostics from this hospitalization (including imaging, microbiology, ancillary and laboratory) are listed below for reference.    Significant Diagnostic Studies: Dg Chest 2 View  Result Date: 07/27/2016 CLINICAL DATA:  76 y/o  F; headache and chills. EXAM: CHEST  2 VIEW COMPARISON:  07/16/2016 chest radiograph. FINDINGS: Stable cardiac silhouette within normal limits. Right central venous catheter tip projects over the right atrium. Post median sternotomy with wires and alignment. Aortic atherosclerosis with calcification. No acute osseous abnormality is evident. Small left pleural effusion is slightly increased. Pulmonary venous hypertension. No focal consolidation. Minor left basilar atelectasis. IMPRESSION: Small left pleural effusion and minor left basilar atelectasis. Pulmonary venous hypertension. No focal consolidation. Electronically Signed   By: Kristine Garbe M.D.   On: 07/27/2016 04:38   Dg Chest 2 View  Result Date: 07/16/2016 CLINICAL DATA:  Productive cough starting last night EXAM: CHEST  2 VIEW COMPARISON:  07/10/2016 FINDINGS: Mild cardiomegaly. Status post CABG with multiple coronary stents. Mild scarring at the right base. Aeration of the bases has improved since prior. There is no edema, consolidation, effusion, or pneumothorax. IMPRESSION: No evidence of acute disease. Electronically Signed   By: Monte Fantasia M.D.   On: 07/16/2016 14:15   Dg Chest 2 View  Result Date: 07/10/2016 CLINICAL DATA:   Shortness of breath. EXAM: CHEST  2 VIEW COMPARISON:  Radiograph of July 08, 2016. FINDINGS: Stable cardiomediastinal silhouette. Status post coronary artery bypass graft. Atherosclerosis of thoracic aorta is noted. No pneumothorax is noted. Stable bibasilar subsegmental atelectasis is noted with associated pleural effusions. Bony thorax is unremarkable. IMPRESSION: Stable bibasilar subsegmental atelectasis with associated pleural effusions. Aortic atherosclerosis. Electronically Signed   By: Marijo Conception, M.D.   On: 07/10/2016 08:34   Ct Head Wo Contrast  Result Date: 07/27/2016 CLINICAL DATA:  Headache. EXAM: CT HEAD WITHOUT CONTRAST TECHNIQUE: Contiguous axial images were obtained from the base of the skull through the vertex without intravenous contrast. COMPARISON:  CT 07/08/2016 FINDINGS: Brain: No evidence of acute infarction, hemorrhage, hydrocephalus, extra-axial collection or mass lesion/mass effect. Stable atrophy and chronic small vessel ischemia. Chronic lacunar infarct in the left external capsule. Vascular: Atherosclerosis of skullbase vasculature without hyperdense vessel or abnormal calcification. Skull: Normal. Negative for fracture or focal lesion. Sinuses/Orbits: Minimal mucosal thickening in left side of sphenoid sinus. Remaining sinuses are clear. Mastoid air cells are well-aerated. Other: None. IMPRESSION: No acute intracranial abnormality. Stable atrophy and chronic small vessel ischemia. Electronically Signed   By: Jeb Levering M.D.   On: 07/27/2016 04:40   Ct Head Wo Contrast  Result Date: 07/08/2016 CLINICAL DATA:  Elevated blood pressure after dialysis currently with headache. No known injury. EXAM: CT HEAD WITHOUT CONTRAST TECHNIQUE: Contiguous axial images were obtained from the base of the skull through the vertex without intravenous contrast. COMPARISON:  Head CTs from 05/19/2016 and 03/09/2016 FINDINGS: Brain: Chronic mild cerebral atrophy without ventricular  dilatation. Chronic small vessel ischemic disease of the white matter noted bilaterally with chronic left lacunar infarct in the left putamen/external capsule. No acute intracranial hemorrhage, midline shift or mass. No extra-axial fluid collection. No effacement of the basal cisterns. Vascular: Vascular calcifications in the intracranial carotid arteries and both vertebral arteries. Skull: No acute osseous abnormality. Sinuses/Orbits: Mucous retention cyst in the sphenoid. Orbits are nonsuspicious mastoids appear clear. Other: None IMPRESSION: Chronic mild cerebral atrophy with small vessel ischemic disease of periventricular white matter. Chronic left putamenal/external capsule lacunar infarct. No acute intracranial abnormality noted. Electronically Signed   By: Ashley Royalty M.D.   On: 07/08/2016 21:14   Nm Myocar Multi W/spect W/wall Motion / Ef  Result Date: 07/29/2016 CLINICAL DATA:  Chest pain EXAM: MYOCARDIAL IMAGING WITH SPECT (REST AND PHARMACOLOGIC-STRESS) GATED LEFT VENTRICULAR WALL MOTION STUDY LEFT VENTRICULAR EJECTION FRACTION TECHNIQUE: Standard myocardial SPECT imaging was performed after resting intravenous injection of 10 mCi Tc-63mtetrofosmin. Subsequently, intravenous infusion of Lexiscan was performed under the supervision of the Cardiology staff. At peak effect of the drug, 30 mCi Tc-976metrofosmin was injected intravenously and standard myocardial SPECT imaging was performed. Quantitative gated imaging was also performed to evaluate left ventricular wall motion, and estimate left ventricular ejection fraction. COMPARISON:  None. FINDINGS: Perfusion: There is a moderate-size the defect in the anterior wall adjacent to the apex and extending into the anterior apex. It is primarily fixed. There is a small area of reversibility towards the base. Wall Motion: Global hypokinesis. Left Ventricular Ejection Fraction: 41 % End diastolic volume 11517l End systolic volume 64 ml IMPRESSION: 1. There  is a primarily fixed defect in the anterior wall and apex which is moderate in size. A small portion of it towards the base is reversible. 2. Global hypokinesis. 3. Left ventricular ejection fraction 41% 4. Non invasive risk stratification*: Intermediate risk. *2012 Appropriate Use Criteria for Coronary Revascularization Focused Update: J Am Coll Cardiol. 206160;73(7):106-269http://content.onairportbarriers.comspx?articleid=[email or web address] Electronically Signed   By: ArMarybelle Killings.D.   On: 07/29/2016 08:19   Ir Fluoro Guide Cv Line Right  Result Date: 08/02/2016 INDICATION: End-stage renal disease, no current permanent access EXAM: ULTRASOUND GUIDANCE FOR VASCULAR ACCESS RIGHT INTERNAL JUGULAR PERMANENT HEMODIALYSIS CATHETER Date:  1/23/20181/23/2018 4:20 pm Radiologist:  M. TrDaryll BrodMD Guidance:  Ultrasound and fluoroscopic FLUOROSCOPY TIME:  Fluoroscopy Time:  2 seconds (  4 mGy). MEDICATIONS: Vancomycin 1 gm IV, administered within 1 hour of the procedure ANESTHESIA/SEDATION: Versed 1 mg IV; Fentanyl 50 mcg IV; Moderate Sedation Time:  31 minutes The patient was continuously monitored during the procedure by the interventional radiology nurse under my direct supervision. CONTRAST:  None. COMPLICATIONS: None immediate. PROCEDURE: Informed consent was obtained from the patient following explanation of the procedure, risks, benefits and alternatives. The patient understands, agrees and consents for the procedure. All questions were addressed. A time out was performed. Maximal barrier sterile technique utilized including caps, mask, sterile gowns, sterile gloves, large sterile drape, hand hygiene, and 2% chlorhexidine scrub. Under sterile conditions and local anesthesia, right internal jugular micropuncture venous access was performed with ultrasound. Images were obtained for documentation. A guide wire was inserted followed by a transitional dilator. Next, a 0.035 guidewire was advanced into the IVC with a  5-French catheter. Measurements were obtained from the right venotomy site to the proximal right atrium. In the right infraclavicular chest, a subcutaneous tunnel was created under sterile conditions and local anesthesia. 1% lidocaine with epinephrine was utilized for this. The 19 cm tip to cuff dialysis catheter was tunneled subcutaneously to the venotomy site and inserted into the SVC/RA junction through a valved peel-away sheath. Position was confirmed with fluoroscopy. Images were obtained for documentation. Blood was aspirated from the catheter followed by saline and heparin flushes. The appropriate volume and strength of heparin was instilled in each lumen. Caps were applied. The catheter was secured at the tunnel site with Gelfoam and a pursestring suture. The venotomy site was closed with subcuticular Vicryl suture. Dermabond was applied to the small right neck incision. A dry sterile dressing was applied. The catheter is ready for use. No immediate complications. IMPRESSION: Ultrasound and fluoroscopically guided right internal jugular tunneled hemodialysis catheter (19 cm tip to cuff dialysis catheter). Electronically Signed   By: Jerilynn Mages.  Shick M.D.   On: 08/02/2016 16:28   Ir Fluoro Guide Cv Line Right  Result Date: 07/26/2016 INDICATION: End-stage renal disease, clotted AV fistula, hyperkalemia EXAM: NON TUNNELED CENTRAL VENOUS HEMODIALYSIS CATHETER WITH ULTRASOUND FLUOROSCOPIC GUIDANCE MEDICATIONS: 1% lidocaine locally ANESTHESIA/SEDATION: Moderate (conscious) sedation was employed during this procedure. A total of Versed 0.5 mg was administered intravenously. Moderate Sedation Time: 15 minutes. The patient's level of consciousness and vital signs were monitored continuously by radiology nursing throughout the procedure under my direct supervision. FLUOROSCOPY TIME:  Fluoroscopy Time: 0 minutes 24 seconds (1 mGy). COMPLICATIONS: None immediate. PROCEDURE: Informed written consent was obtained from the  patient after a discussion of the risks, benefits, and alternatives to treatment. Questions regarding the procedure were encouraged and answered. The right neck and chest were prepped with chlorhexidine in a sterile fashion, and a sterile drape was applied covering the operative field. Maximum barrier sterile technique with sterile gowns and gloves were used for the procedure. A timeout was performed prior to the initiation of the procedure. After creating a small venotomy incision, a micropuncture kit was utilized to access the right internal jugular vein under direct, real-time ultrasound guidance after the overlying soft tissues were anesthetized with 1% lidocaine with epinephrine. Ultrasound image documentation was performed. Micro guidewire advanced. Four [place] dilator inserted. Amplatz guidewire advanced into the IVC. Tract dilatation performed to insert a 20 cm temporary dialysis catheter. Tip position at the SVC RA junction. Catheter secured with a Prolene suture and a sterile dressing. Blood aspirated easily followed by saline and heparin flushes. External caps applied. Patient tolerated the procedure well.  No immediate complication. IMPRESSION: Successful ultrasound and fluoroscopic insertion of a 20 cm temporary right IJ dialysis catheter. Tip SVC RA junction. Ready for use. Electronically Signed   By: Jerilynn Mages.  Shick M.D.   On: 07/26/2016 15:28   Ir US Guide Vasc Access Right  Result Date: 08/02/2016 INDICATION: End-stage renal disease, no current permanent access EXAM: ULTRASOUND GUIDANCE FOR VASCULAR ACCESS RIGHT INTERNAL JUGULAR PERMANENT HEMODIALYSIS CATHETER Date:  1/23/20181/23/2018 4:20 pm Radiologist:  M. Daryll Brod, MD Guidance:  Ultrasound and fluoroscopic FLUOROSCOPY TIME:  Fluoroscopy Time:  2 seconds (4 mGy). MEDICATIONS: Vancomycin 1 gm IV, administered within 1 hour of the procedure ANESTHESIA/SEDATION: Versed 1 mg IV; Fentanyl 50 mcg IV; Moderate Sedation Time:  31 minutes The patient  was continuously monitored during the procedure by the interventional radiology nurse under my direct supervision. CONTRAST:  None. COMPLICATIONS: None immediate. PROCEDURE: Informed consent was obtained from the patient following explanation of the procedure, risks, benefits and alternatives. The patient understands, agrees and consents for the procedure. All questions were addressed. A time out was performed. Maximal barrier sterile technique utilized including caps, mask, sterile gowns, sterile gloves, large sterile drape, hand hygiene, and 2% chlorhexidine scrub. Under sterile conditions and local anesthesia, right internal jugular micropuncture venous access was performed with ultrasound. Images were obtained for documentation. A guide wire was inserted followed by a transitional dilator. Next, a 0.035 guidewire was advanced into the IVC with a 5-French catheter. Measurements were obtained from the right venotomy site to the proximal right atrium. In the right infraclavicular chest, a subcutaneous tunnel was created under sterile conditions and local anesthesia. 1% lidocaine with epinephrine was utilized for this. The 19 cm tip to cuff dialysis catheter was tunneled subcutaneously to the venotomy site and inserted into the SVC/RA junction through a valved peel-away sheath. Position was confirmed with fluoroscopy. Images were obtained for documentation. Blood was aspirated from the catheter followed by saline and heparin flushes. The appropriate volume and strength of heparin was instilled in each lumen. Caps were applied. The catheter was secured at the tunnel site with Gelfoam and a pursestring suture. The venotomy site was closed with subcuticular Vicryl suture. Dermabond was applied to the small right neck incision. A dry sterile dressing was applied. The catheter is ready for use. No immediate complications. IMPRESSION: Ultrasound and fluoroscopically guided right internal jugular tunneled hemodialysis  catheter (19 cm tip to cuff dialysis catheter). Electronically Signed   By: Jerilynn Mages.  Shick M.D.   On: 08/02/2016 16:28   Ir US Guide Vasc Access Right  Result Date: 07/26/2016 INDICATION: End-stage renal disease, clotted AV fistula, hyperkalemia EXAM: NON TUNNELED CENTRAL VENOUS HEMODIALYSIS CATHETER WITH ULTRASOUND FLUOROSCOPIC GUIDANCE MEDICATIONS: 1% lidocaine locally ANESTHESIA/SEDATION: Moderate (conscious) sedation was employed during this procedure. A total of Versed 0.5 mg was administered intravenously. Moderate Sedation Time: 15 minutes. The patient's level of consciousness and vital signs were monitored continuously by radiology nursing throughout the procedure under my direct supervision. FLUOROSCOPY TIME:  Fluoroscopy Time: 0 minutes 24 seconds (1 mGy). COMPLICATIONS: None immediate. PROCEDURE: Informed written consent was obtained from the patient after a discussion of the risks, benefits, and alternatives to treatment. Questions regarding the procedure were encouraged and answered. The right neck and chest were prepped with chlorhexidine in a sterile fashion, and a sterile drape was applied covering the operative field. Maximum barrier sterile technique with sterile gowns and gloves were used for the procedure. A timeout was performed prior to the initiation of the procedure. After creating a  small venotomy incision, a micropuncture kit was utilized to access the right internal jugular vein under direct, real-time ultrasound guidance after the overlying soft tissues were anesthetized with 1% lidocaine with epinephrine. Ultrasound image documentation was performed. Micro guidewire advanced. Four [place] dilator inserted. Amplatz guidewire advanced into the IVC. Tract dilatation performed to insert a 20 cm temporary dialysis catheter. Tip position at the SVC RA junction. Catheter secured with a Prolene suture and a sterile dressing. Blood aspirated easily followed by saline and heparin flushes. External  caps applied. Patient tolerated the procedure well. No immediate complication. IMPRESSION: Successful ultrasound and fluoroscopic insertion of a 20 cm temporary right IJ dialysis catheter. Tip SVC RA junction. Ready for use. Electronically Signed   By: Jerilynn Mages.  Shick M.D.   On: 07/26/2016 15:28   Dg Chest Port 1 View  Result Date: 07/27/2016 CLINICAL DATA:  Evaluate catheter. EXAM: PORTABLE CHEST 1 VIEW COMPARISON:  Chest radiograph July 27, 2016 FINDINGS: Cardiac silhouette is mildly enlarged. Pulmonary vascular congestion. Mild calcific atherosclerosis aortic knob mild. Status post median sternotomy for CABG. Coronary artery stent. Stable appearance of RIGHT internal jugular central venous catheter with distal tip projecting in proximal atria. No pneumothorax. Bibasilar strandy densities. Blunting of the LEFT costophrenic angle. No consolidation. Soft tissue planes and included osseous structures are nonsuspicious. IMPRESSION: Stable RIGHT internal jugular central venous catheter distal tip projecting in RIGHT atrium, recommend 2 cm retraction. Bibasilar atelectasis and trace LEFT pleural effusion versus pleural thickening. Mild cardiomegaly and pulmonary vascular congestion. Electronically Signed   By: Elon Alas M.D.   On: 07/27/2016 17:48   Dg Chest Port 1 View  Result Date: 07/09/2016 CLINICAL DATA:  Dyspnea and cough. EXAM: PORTABLE CHEST 1 VIEW COMPARISON:  06/15/2016 CXR FINDINGS: Patient is status post median sternotomy. There is stable cardiomegaly. The aorta is atherosclerotic without aneurysm. Bibasilar atelectasis right greater than left is noted. Slightly more confluent appearing airspace opacities at the right lung base cannot exclude pneumonia. No acute osseous appearing abnormality. IMPRESSION: Cardiomegaly with aortic atherosclerosis. Bibasilar atelectasis with findings suspicious for superimposed right lower lobe pneumonia. Electronically Signed   By: Ashley Royalty M.D.   On:  07/09/2016 00:51    Microbiology: Recent Results (from the past 240 hour(s))  MRSA PCR Screening     Status: None   Collection Time: 07/27/16  2:29 PM  Result Value Ref Range Status   MRSA by PCR NEGATIVE NEGATIVE Final    Comment:        The GeneXpert MRSA Assay (FDA approved for NASAL specimens only), is one component of a comprehensive MRSA colonization surveillance program. It is not intended to diagnose MRSA infection nor to guide or monitor treatment for MRSA infections.      Labs: Basic Metabolic Panel:  Recent Labs Lab 07/27/16 0355 07/27/16 1640 07/28/16 1525 07/29/16 0239 07/31/16 0520  NA 134* 137 135 138 134*  K 4.3 4.6 4.5 3.7 3.8  CL 92* 99* 97* 99* 98*  CO2 _0 GLUCOSE 147* 97 112* 125* 111*  BUN 47* 53* 64* 15 11  CREATININE 7.99* 9.11* 10.68* 4.28* 4.27*  CALCIUM 8.8* 8.1* 7.3* 7.8* 8.4*  PHOS  --  6.7* 5.9*  --   --    Liver Function Tests:  Recent Labs Lab 07/27/16 1640 07/28/16 1525  ALBUMIN 2.8* 2.8*   No results for input(s): LIPASE, AMYLASE in the last 168 hours. No results for input(s): AMMONIA in the last 168 hours. CBC:  Recent  Labs Lab 07/27/16 0355  07/27/16 1640  07/28/16 0817  07/29/16 0239 07/29/16 0835 07/29/16 1723 07/29/16 2013 07/30/16 0227 07/31/16 0520  WBC 11.7*  --  9.8  --  7.8  --  6.7  --   --   --   --  7.2  NEUTROABS 10.2*  --   --   --   --   --   --   --   --   --   --   --   HGB 8.6*  < > 8.1*  < > 7.4*  < > 9.8* 9.8* 11.0* 10.0* 10.2* 10.2*  HCT 27.2*  < > 25.8*  < > 23.8*  < > 30.2* 30.6* 34.0* 30.9* 31.6* 32.0*  MCV 100.7*  --  100.0  --  100.8*  --  95.9  --   --   --   --  97.6  PLT 303  --  258  --  214  --  238  --   --   --   --  195  < > = values in this interval not displayed. Cardiac Enzymes:  Recent Labs Lab 07/27/16 0355 07/28/16 0232 07/28/16 0817 07/28/16 1411  TROPONINI 1.52* 2.59* 2.08* 1.14*   BNP: BNP (last 3 results)  Recent Labs  07/16/16 1517  BNP  372.0*    ProBNP (last 3 results) No results for input(s): PROBNP in the last 8760 hours.  CBG: No results for input(s): GLUCAP in the last 168 hours.     SignedKinnie Feil  [facility] 08/02/2016, 5:38 PM

## 2016-08-02 NOTE — Procedures (Signed)
S/p RT IJ HD CATH  Tip svcra No comp Ready for use Full report in PACS EBL 0

## 2016-08-02 NOTE — Progress Notes (Signed)
[facility] PROGRESS NOTE  Shawna [name] MPN:[id number] DOB: 05-31-1941 DOA: 07/27/2016 PCP: [name], MD  Assessment/Plan: 76 y.o.femalewith hx of ESRD on HD, clotted AVF on her left upper arm, s/p tunnel catheter placement 1/16, receiving HD urgently prior to admission for hyperkalemia, hx of severe HTN, hx of GI bleed, AVM, Carotid disease, HLD, chronic diastolic CHF, presented to the [facility] ER as she was found to be severely HTN at home with SBP 250 and had retrosternal Chest pain. Noted to have elevated troponin and therefore transferred from [facility] to [facility]. Evaluated by Cardiology: ?demand ischemia on admission. Cardiology recommended medical management. Also, found to have Heme + stool and received Protonix infusion. Underwent endoscopy found to have AVM. Awaiting IR tunneled cath placement    Hypertensive emergency. may have been related to inability to be dialyzed on her usual day -BP improved- cont Norvasc,  Imdur, Metoprolol and Clonidine  Probable ACS vs demand ischemia on admission. Troponin 1.52>>2.59>>2.08>>1.14. CAD s/p CABG in 2013. likely secondary to HTN, fluid overload- cardiology following. ECHO shows septal and apical hypokinesia with preserved EF. Myoview is + for apical scar with superimposed reversible ischemia -cardiology d/w patient at length recommended medical management for now. Cancelled LHC. Cont ASA, statin  Hemoccult + stool. EGD in 9/14 >> Non bleeding Duodenal AVM. repeat EGD showed an AVM in prox jejunum which was ablated -no new episodes of bleeding. due to acute MI, will not hold Aspirin. GI plans to f/u as outpt  Anemia of chronic kidney failure. heme + as well on admission. . Received 1 u PRBC 1/18 for Hb 7.4 in setting of ACS- Hb now 10.2  ESRD. with LUE fistula malfunction on Monday. temp cath placed on Tues to allow for dialysis.  -awaiting tunneled dialysis cath placed by IR. Called d/w IR. Defer further management per  Nephro  Chronic diastolic CHF (congestive heart failure) ([place]). grade 2 d CHF per ECHO in 3/17.  fluid management with dialysis  Code Status: full Family Communication: d/w patient,husband. rn (indicate person spoken with, relationship, and if by phone, the number) Disposition Plan: home pend IR tunneled cath placement    Consultants:   cardiology Procedures:   Myoview stress test  2 D ECHO - Left ventricle: Septal and apical hypokinesis. The cavity size was normal. Wall thickness was normal. Systolic function was normal. The estimated ejection fraction was in the range of 50% to 55%. Doppler parameters are consistent with both elevated ventricular end-diastolic filling pressure and elevated left atrial filling pressure. - Mitral valve: There was mild regurgitation. Valve area by continuity equation (using LVOT flow): 1.49 cm^2. - Left atrium: The atrium was moderately dilated. - Atrial septum: No defect or patent foramen ovale was identified. - Pulmonary arteries: PA peak pressure: 54 mm Hg (S).   EGD Antimicrobials:            Anti-infectives    Start     Dose/Rate Route Frequency Ordered Stop   07/29/16 0800  vancomycin (VANCOCIN) IVPB 1000 mg/200 mL premix  Status:  Discontinued     1,000 mg 200 mL/hr over 60 Minutes Intravenous To Radiology 07/29/16 0747 07/29/16 1213      HPI/Subjective: Alert, no distress. Awaiting IR cath placement. Wants to go home   Objective: Vitals:   08/02/16 0615 08/02/16 0839  BP: (!) 155/58 (!) 159/58  Pulse: 69 70  Resp:  18  Temp:  98.8 F (37.1 C)    Intake/Output Summary (Last 24 hours)  at 08/02/16 1022 Last data filed at 08/02/16 0615  Gross per 24 hour  Intake              700 ml  Output             1100 ml  Net             -400 ml   Filed Weights   08/01/16 1950 08/01/16 2330 08/02/16 0344  Weight: 58.1 kg (128 lb 1.4 oz) 57 kg (125 lb 10.6 oz) 58 kg (127 lb 13.9 oz)    Exam:   General:   Comfortable   Cardiovascular: s1,s2 rrr  Respiratory: CTYA BL  Abdomen: soft, nt,   Musculoskeletal: no pedal edema    Data Reviewed: Basic Metabolic Panel:  Recent Labs Lab 07/27/16 0355 07/27/16 1640 07/28/16 1525 07/29/16 0239 07/31/16 0520  NA 134* 137 135 138 134*  K 4.3 4.6 4.5 3.7 3.8  CL 92* 99* 97* 99* 98*  CO2 26 26 23 30 27   GLUCOSE 147* 97 112* 125* 111*  BUN 47* 53* 64* 15 11  CREATININE 7.99* 9.11* 10.68* 4.28* 4.27*  CALCIUM 8.8* 8.1* 7.3* 7.8* 8.4*  PHOS  --  6.7* 5.9*  --   --    Liver Function Tests:  Recent Labs Lab 07/27/16 1640 07/28/16 1525  ALBUMIN 2.8* 2.8*   No results for input(s): LIPASE, AMYLASE in the last 168 hours. No results for input(s): AMMONIA in the last 168 hours. CBC:  Recent Labs Lab 07/26/16 1050  07/27/16 0355  07/27/16 1640  07/28/16 0817  07/29/16 0239 07/29/16 0835 07/29/16 1723 07/29/16 2013 07/30/16 0227 07/31/16 0520  WBC 10.9*  --  11.7*  --  9.8  --  7.8  --  6.7  --   --   --   --  7.2  NEUTROABS 10.4*  --  10.2*  --   --   --   --   --   --   --   --   --   --   --   HGB 10.4*  < > 8.6*  < > 8.1*  < > 7.4*  < > 9.8* 9.8* 11.0* 10.0* 10.2* 10.2*  HCT 33.0*  < > 27.2*  < > 25.8*  < > 23.8*  < > 30.2* 30.6* 34.0* 30.9* 31.6* 32.0*  MCV 100.3*  --  100.7*  --  100.0  --  100.8*  --  95.9  --   --   --   --  97.6  PLT 366  --  303  --  258  --  214  --  238  --   --   --   --  195  < > = values in this interval not displayed. Cardiac Enzymes:  Recent Labs Lab 07/27/16 0355 07/28/16 0232 07/28/16 0817 07/28/16 1411  TROPONINI 1.52* 2.59* 2.08* 1.14*   BNP (last 3 results)  Recent Labs  07/16/16 1517  BNP 372.0*    ProBNP (last 3 results) No results for input(s): PROBNP in the last 8760 hours.  CBG: No results for input(s): GLUCAP in the last 168 hours.  Recent Results (from the past 240 hour(s))  MRSA PCR Screening     Status: None   Collection Time: 07/27/16  2:29 PM  Result Value Ref  Range Status   MRSA by PCR NEGATIVE NEGATIVE Final    Comment:        The GeneXpert MRSA Assay (FDA approved  for NASAL specimens only), is one component of a comprehensive MRSA colonization surveillance program. It is not intended to diagnose MRSA infection nor to guide or monitor treatment for MRSA infections.      Studies: No results found.  Scheduled Meds: . sodium chloride   Intravenous Once  . acetaminophen  1,000 mg Oral Once  . amiodarone  200 mg Oral Daily  . amLODipine  10 mg Oral Daily  . aspirin EC  81 mg Oral Daily  . cinacalcet  30 mg Oral Q supper  . cloNIDine  0.2 mg Oral TID  . darbepoetin (ARANESP) injection - DIALYSIS  100 mcg Intravenous Q Sat-HD  . doxercalciferol  1.5 mcg Intravenous Q M,W,F-HD  . fluticasone  2 spray Each Nare Daily  . [START ON 08/03/2016] heparin  5,000 Units Subcutaneous Q8H  . hydrALAZINE  50 mg Oral Q8H  . isosorbide mononitrate  90 mg Oral Daily  . lanthanum  1,000 mg Oral TID WC  . metoprolol tartrate  12.5 mg Oral BID  . multivitamin  1 tablet Oral Daily  . pantoprazole  40 mg Oral Daily  . simvastatin  20 mg Oral QHS  . sodium chloride flush  3 mL Intravenous Q12H  . vancomycin  1,000 mg Intravenous to XRAY   Continuous Infusions: . sodium chloride      Principal Problem:   Hypertensive emergency Active Problems:   HLD (hyperlipidemia)   Mitral regurgitation   Anemia of chronic kidney failure   Acute blood loss anemia   Chronic diastolic CHF (congestive heart failure) ([facility])   Essential hypertension   Acute on chronic diastolic CHF (congestive heart failure) ([name])   Hypertensive crisis   Demand ischemia ([place])   ESRD (end stage renal disease) ([place])   Heme positive stool    Time spent: >35 minutes     Kinnie Feil  [facility] Pager [phone]. If 7PM-7AM, please contact night-coverage at [email or web address], password [facility] 08/02/2016, 10:22 AM  LOS: 6 days

## 2016-08-02 NOTE — Progress Notes (Signed)
[facility] Progress Note    Subjective:    No chest pain Waiting to go back to IR for  [facility] (bumped for an emergency earlier today) 58th wedding anniversary today!!! Wants to get home   Objective:   BP (!) 159/58 (BP Location: Right Arm)   Pulse 70   Temp 98.8 F (37.1 C) (Oral)   Resp 18   Ht 5\' 2"  (1.575 m)   Wt 58 kg (127 lb 13.9 oz)   SpO2 98%   BMI 23.39 kg/m   Physical Exam:   NAD, appears younger than stated age Sitting by the window Temp R IJ cath No JVD Lungs clear S1S2 No S3 or murmur L AVF thrombosed, no cellulitic change or erythema Abd soft not tender Trace edema LE's   Recent Labs Lab 07/26/16 1847 07/27/16 0355 07/27/16 1640 07/28/16 1525 07/29/16 0239 07/31/16 0520  NA 135 134* 137 135 138 134*  K 2.4* 4.3 4.6 4.5 3.7 3.8  CL 95* 92* 99* 97* 99* 98*  CO2  --  26 26 23 30 27   GLUCOSE 99 147* 97 112* 125* 111*  BUN 23* 47* 53* 64* 15 11  CREATININE 5.00* 7.99* 9.11* 10.68* 4.28* 4.27*  CALCIUM  --  8.8* 8.1* 7.3* 7.8* 8.4*  PHOS  --   --  6.7* 5.9*  --   --     Recent Labs Lab 07/27/16 0355  07/27/16 1640  07/28/16 0817  07/29/16 0239  07/29/16 1723 07/29/16 2013 07/30/16 0227 07/31/16 0520  WBC 11.7*  --  9.8  --  7.8  --  6.7  --   --   --   --  7.2  NEUTROABS 10.2*  --   --   --   --   --   --   --   --   --   --   --   HGB 8.6*  < > 8.1*  < > 7.4*  < > 9.8*  < > 11.0* 10.0* 10.2* 10.2*  HCT 27.2*  < > 25.8*  < > 23.8*  < > 30.2*  < > 34.0* 30.9* 31.6* 32.0*  MCV 100.7*  --  100.0  --  100.8*  --  95.9  --   --   --   --  97.6  PLT 303  --  258  --  214  --  238  --   --   --   --  195  < > = values in this interval not displayed.   Medications:    . sodium chloride   Intravenous Once  . acetaminophen  1,000 mg Oral Once  . amiodarone  200 mg Oral Daily  . amLODipine  10 mg Oral Daily  . aspirin EC  81 mg Oral Daily  . cinacalcet  30 mg Oral Q supper  . cloNIDine  0.2 mg Oral TID  . darbepoetin  (ARANESP) injection - DIALYSIS  100 mcg Intravenous Q Sat-HD  . doxercalciferol  1.5 mcg Intravenous Q M,W,F-HD  . fluticasone  2 spray Each Nare Daily  . gelatin adsorbable      . heparin      . [START ON 08/03/2016] heparin  5,000 Units Subcutaneous Q8H  . hydrALAZINE  50 mg Oral Q8H  . isosorbide mononitrate  90 mg Oral Daily  . lanthanum  1,000 mg Oral TID WC  . lidocaine (PF)      . metoprolol tartrate  12.5 mg Oral BID  .  multivitamin  1 tablet Oral Daily  . pantoprazole  40 mg Oral Daily  . simvastatin  20 mg Oral QHS  . sodium chloride flush  3 mL Intravenous Q12H  . vancomycin  1,000 mg Intravenous to XRAY    Assessment/ Plan:   1. Chest pain/ NSTEMI:  No plan for cath at this time. Cards rec med Rx/ Pt will follow up as an outpatient with her cardiologist as outpt 2. GIB: s/p jejunal AVM ablation, no further bleeding 3. ESRD: MWF next HD 1/24 4. Hypertension: initially had hypertensive urgency, pressures better with resumption of home meds, inc amlodipine to 10 mg daily per cardiology 5. Anemia of ESRD: Will need to increase ESA, Aranesp 100 q Saturday 6. Metabolic Bone Disease: fosrenol as binder, hectorol with HD 7. Access, TDC, vein mapping, (potential [facility]VS c/s but I suspect they would be hesitant w/her cardiac issues). Order for [name]  was placed with IR yesterday - spoke with them - plan is for later today (postponed earlier b/o another emergency, then issues with no IV access for sedation)  Jamal Maes, MD [place] Pager 08/02/2016, 2:48 PM

## 2016-08-02 NOTE — Progress Notes (Signed)
Both the pt & her husband were given discharge instructions. Pt was educated & given information on medications, dialysis cath site care, follow-up appointments, diet, daily wts (& their importance), & when to call the MD. All of the pt's questions have been answered. Pt's IV was removed & CCMD was notified of pt being d/c'd. Hoover Brunette, RN

## 2016-08-02 NOTE — Care Management Note (Signed)
Case Management Note  Patient Details  Name: KYNZIE POLGAR MRN: [id number] Date of Birth: March 03, 1941  Subjective/Objective:   Pt presented for Hypertensive Urgency. Pt is from home with husband and plans to return to [place] once d/c. Pt is active with [facility]- wants to resume services. CM did speak with [place] intake coordinator in regards to orders. CM to fax orders to [phone].                  Action/Plan: No DME needs at this time. No further needs from CM at this time.   Expected Discharge Date:                  Expected Discharge Plan:  [place]  In-House Referral:  NA  Discharge planning Services  CM Consult  Post Acute Care Choice:  Home Health Choice offered to:  Patient  DME Arranged:  N/A DME Agency:  NA  HH Arranged:  RN, PT [place] Agency:  [facility] ([employer] [phone])  Status of Service:  Completed, signed off  If discussed at [place] of Stay Meetings, dates discussed:    Additional Comments:  Bethena Roys, RN 08/02/2016, 1:59 PM

## 2016-08-02 NOTE — Progress Notes (Signed)
Progress Note  Patient Name: Shawna [name] Date of Encounter: 08/02/2016  Primary Cardiologist: Dr. Bronson Ing  Subjective   Feels well, denies chest pain.   Inpatient Medications    Scheduled Meds: . sodium chloride   Intravenous Once  . acetaminophen  1,000 mg Oral Once  . amiodarone  200 mg Oral Daily  . amLODipine  2.5 mg Oral Daily  . aspirin EC  81 mg Oral Daily  . cinacalcet  30 mg Oral Q supper  . cloNIDine  0.2 mg Oral TID  . darbepoetin (ARANESP) injection - DIALYSIS  100 mcg Intravenous Q Sat-HD  . doxercalciferol  1.5 mcg Intravenous Q M,W,F-HD  . fluticasone  2 spray Each Nare Daily  . [START ON 08/03/2016] heparin  5,000 Units Subcutaneous Q8H  . hydrALAZINE  50 mg Oral Q8H  . isosorbide mononitrate  90 mg Oral Daily  . lanthanum  1,000 mg Oral TID WC  . metoprolol tartrate  12.5 mg Oral BID  . multivitamin  1 tablet Oral Daily  . pantoprazole  40 mg Oral Daily  . simvastatin  20 mg Oral QHS  . sodium chloride flush  3 mL Intravenous Q12H  . vancomycin  1,000 mg Intravenous to XRAY   Continuous Infusions: . sodium chloride     PRN Meds: sodium chloride, sodium chloride, albuterol, ALPRAZolam, alteplase, heparin, lidocaine (PF), lidocaine-prilocaine, menthol-cetylpyridinium, pentafluoroprop-tetrafluoroeth, sodium chloride flush   Vital Signs    Vitals:   08/01/16 2330 08/02/16 0059 08/02/16 0344 08/02/16 0615  BP:  (!) 126/50 (!) 146/66 (!) 155/58  Pulse:  71 68 69  Resp: 17 19 18    Temp: 98 F (36.7 C)  99.1 F (37.3 C)   TempSrc: Oral  Oral   SpO2: 96% 95% 97%   Weight: 125 lb 10.6 oz (57 kg)  127 lb 13.9 oz (58 kg)   Height:        Intake/Output Summary (Last 24 hours) at 08/02/16 0837 Last data filed at 08/02/16 0615  Gross per 24 hour  Intake              700 ml  Output             1100 ml  Net             -400 ml   Filed Weights   08/01/16 1950 08/01/16 2330 08/02/16 0344  Weight: 128 lb 1.4 oz (58.1 kg) 125 lb 10.6 oz (57 kg) 127  lb 13.9 oz (58 kg)    Telemetry    NSR- Personally Reviewed  ECG    NSR with LVH  - Personally Reviewed  Physical Exam   GEN: No acute distress.  Neck: No JVD Cardiac: RRR, no murmurs, rubs, or gallops.  Respiratory: Clear to auscultation bilaterally. GI: Soft, nontender, non-distended  MS: No edema; No deformity. Neuro:  AAOx3. Psych: Normal affect  Labs    Chemistry Recent Labs Lab 07/27/16 1640 07/28/16 1525 07/29/16 0239 07/31/16 0520  NA 137 135 138 134*  K 4.6 4.5 3.7 3.8  CL 99* 97* 99* 98*  CO2 26 23 30 27   GLUCOSE 97 112* 125* 111*  BUN 53* 64* 15 11  CREATININE 9.11* 10.68* 4.28* 4.27*  CALCIUM 8.1* 7.3* 7.8* 8.4*  ALBUMIN 2.8* 2.8*  --   --   GFRNONAA 4* 3* 9* 9*  GFRAA 4* 4* 11* 11*  ANIONGAP 12 15 9 9      Hematology Recent Labs Lab 07/28/16 [phone] 07/28/16 1411  07/29/16 0239  07/29/16 2013 07/30/16 0227 07/31/16 0520  WBC 7.8  --   --  6.7  --   --   --  7.2  RBC 2.36* 2.63*  --  3.15*  --   --   --  3.28*  HGB 7.4* 8.3*  < > 9.8*  < > 10.0* 10.2* 10.2*  HCT 23.8* 26.4*  < > 30.2*  < > 30.9* 31.6* 32.0*  MCV 100.8*  --   --  95.9  --   --   --  97.6  MCH 31.4  --   --  31.1  --   --   --  31.1  MCHC 31.1  --   --  32.5  --   --   --  31.9  RDW 18.1*  --   --  19.1*  --   --   --  17.5*  PLT 214  --   --  238  --   --   --  195  < > = values in this interval not displayed.  Cardiac Enzymes Recent Labs Lab 07/27/16 0355 07/28/16 0232 07/28/16 0817 07/28/16 1411  TROPONINI 1.52* 2.59* 2.08* 1.14*    Radiology    No results found.  Cardiac Studies  Transthoracic Echocardiography 07/28/16 Study Conclusions  - Left ventricle: Septal and apical hypokinesis. The cavity size was normal. Wall thickness was normal. Systolic function was normal. The estimated ejection fraction was in the range of 50% to 55%. Doppler parameters are consistent with both elevated ventricular end-diastolic filling pressure and elevated  left atrial filling pressure. - Mitral valve: There was mild regurgitation. Valve area by continuity equation (using LVOT flow): 1.49 cm^2. - Left atrium: The atrium was moderately dilated. - Atrial septum: No defect or patent foramen ovale was identified. - Pulmonary arteries: PA peak pressure: 54 mm Hg (S).   MYOCARDIAL IMAGING WITH SPECT (REST AND PHARMACOLOGIC-STRESS) IMPRESSION: 1. There is a primarily fixed defect in the anterior wall and apex which is moderate in size. A small portion of it towards the base is reversible.  2. Global hypokinesis.  3. Left ventricular ejection fraction 41%  4. Non invasive risk stratification*: Intermediate risk.    Patient Profile     76 y.o. female with a past medical history of CAD s/p CABG in 2013. She presented on 07/27/16 with chest pain and hypertensive urgency. Also with ESRD on HD.   Last cardiac catheterization performed on 12/10/2013 showed a patent left main, focal disease in the mid to distal LAD, patent diagonal, patent LIMA to LAD, severe stenosis in proximal left circumflex, patent SVG to left circumflex, 70% proximal ramus stenosis, patent SVG to ramus, occluded stent to ostium of RCA and finely patent SVG to distal RCA. She had a normal Myoview on 08/25/2014.  On 05/11/2016, she had a PEA arrest requiring CPRalong with episodes of bradycardia. Afterward, she did require intubation as well. This occurred in the setting of hyperkalemia with potassium 6.4 on epi drip. Since then she does occasional chest pain with palpation.   Assessment & Plan    1. Demand ischemia: Troponin 1.52>>2.59>>2.08>>1.14. In the setting of hypertensive urgency and ESRD but with history of CAD s/p CABG. Last heart cath showed patent grafts. She presented with chest pain and hypertensive crisis. Ecg without acute change. Troponin peak at 2.59. She also had GI bleed with Hgb down to 7.4. She has a history of contrast allergy. She has no IV access  except for  her dialysis catheter.   Myoview shows a fixed anterior wall defect with EF 41%. Per Dr. Doug Sou note yesterday " Clinically I suspect her troponin elevation is due to demand ischemia in setting of hypertensive crisis and GI bleed. The Myoview results are concerning for infarct but results are discordant with the Echo results that show normal LV function. We discussed options at this point including continued medical therapy versus cardiac cath. At this point cardiac cath would be diagnostic only since if she had a lesion that needed stenting we could not proceed at this point with recent GIB and cautery of AVM. After extensive discussion we decided at this point that the most prudent approach would be to continue medical therapy with aggressive BP control. If she has recurrent chest pain despite good medical therapy then we could justify risk of more invasive approach. The patient and her husband are in agreement with this approach. She will follow up with Dr. Bronson Ing as an outpatient."  2. Hypertension: Hypertensive, note from yesterday says to increase amlodipine to 10mg , she is on 2.5mg  already. Will increase to 10mg  today.   3. ESRD: Per Renal   Hx GI bleed - heme+ stool (1 unit PRBC base Hg 7.4) - S/p enteroscopy yesterday with APC ablation of jejunal AVM which could have caused her symptoms. Dr. Havery Moros.   Signed, Arbutus Leas, NP  08/02/2016, 8:37 AM   Patient seen and examined and history reviewed. Agree with above findings and plan. No recurrent chest pain. No dyspnea. Tolerated dialysis well yesterday. Plan for tunneled dialysis catheter placement today per IR. OK for DC from our standpoint.   Madelynn Malson [place], [place] 08/02/2016 9:19 AM

## 2016-08-03 DIAGNOSIS — N186 End stage renal disease: Secondary | ICD-10-CM | POA: Diagnosis not present

## 2016-08-03 DIAGNOSIS — Z992 Dependence on renal dialysis: Secondary | ICD-10-CM | POA: Diagnosis not present

## 2016-08-05 DIAGNOSIS — N186 End stage renal disease: Secondary | ICD-10-CM | POA: Diagnosis not present

## 2016-08-05 DIAGNOSIS — Z992 Dependence on renal dialysis: Secondary | ICD-10-CM | POA: Diagnosis not present

## 2016-08-08 DIAGNOSIS — Z992 Dependence on renal dialysis: Secondary | ICD-10-CM | POA: Diagnosis not present

## 2016-08-08 DIAGNOSIS — N186 End stage renal disease: Secondary | ICD-10-CM | POA: Diagnosis not present

## 2016-08-09 DIAGNOSIS — I501 Left ventricular failure: Secondary | ICD-10-CM | POA: Diagnosis not present

## 2016-08-09 DIAGNOSIS — I10 Essential (primary) hypertension: Secondary | ICD-10-CM | POA: Diagnosis not present

## 2016-08-09 DIAGNOSIS — D631 Anemia in chronic kidney disease: Secondary | ICD-10-CM | POA: Diagnosis not present

## 2016-08-09 DIAGNOSIS — I2510 Atherosclerotic heart disease of native coronary artery without angina pectoris: Secondary | ICD-10-CM | POA: Diagnosis not present

## 2016-08-10 ENCOUNTER — Other Ambulatory Visit: Payer: Self-pay | Admitting: *Deleted

## 2016-08-10 DIAGNOSIS — I501 Left ventricular failure: Secondary | ICD-10-CM | POA: Diagnosis not present

## 2016-08-10 DIAGNOSIS — I10 Essential (primary) hypertension: Secondary | ICD-10-CM | POA: Diagnosis not present

## 2016-08-10 DIAGNOSIS — I739 Peripheral vascular disease, unspecified: Secondary | ICD-10-CM

## 2016-08-10 DIAGNOSIS — I2510 Atherosclerotic heart disease of native coronary artery without angina pectoris: Secondary | ICD-10-CM | POA: Diagnosis not present

## 2016-08-10 DIAGNOSIS — I6529 Occlusion and stenosis of unspecified carotid artery: Secondary | ICD-10-CM

## 2016-08-10 DIAGNOSIS — N186 End stage renal disease: Secondary | ICD-10-CM | POA: Diagnosis not present

## 2016-08-10 DIAGNOSIS — Z992 Dependence on renal dialysis: Secondary | ICD-10-CM | POA: Diagnosis not present

## 2016-08-10 DIAGNOSIS — D631 Anemia in chronic kidney disease: Secondary | ICD-10-CM | POA: Diagnosis not present

## 2016-08-11 ENCOUNTER — Ambulatory Visit (INDEPENDENT_AMBULATORY_CARE_PROVIDER_SITE_OTHER): Payer: Medicare HMO | Admitting: Vascular Surgery

## 2016-08-11 ENCOUNTER — Encounter: Payer: Self-pay | Admitting: Vascular Surgery

## 2016-08-11 ENCOUNTER — Other Ambulatory Visit: Payer: Self-pay

## 2016-08-11 VITALS — BP 147/68 | HR 70 | Temp 98.3°F | Resp 20 | Ht 62.0 in | Wt 125.0 lb

## 2016-08-11 DIAGNOSIS — N186 End stage renal disease: Secondary | ICD-10-CM | POA: Diagnosis not present

## 2016-08-11 DIAGNOSIS — Z992 Dependence on renal dialysis: Secondary | ICD-10-CM

## 2016-08-11 NOTE — Progress Notes (Signed)
Referring Physician: Dr Lorrene Reid  Patient name: Shawna [name] MRN: [id number] DOB: 01-24-1941 Sex: female  REASON FOR CONSULT: hemodialysis access  HPI: Shawna [name] is a 76 y.o. female in for placement of a new hemodialysis access. She previously had a left brachiocephalic fistula that lasted for about 7 years but recently occluded. She was recently in the hospital with a GI bleed and also had a non-ST MI. This was about one week ago. She states she feels better at this point. She has had no further bleeding. She denies any current chest pain or shortness of breath. Other medical problems include AVMs of the colon, coronary artery disease, elevated cholesterol mitral regurgitation, peripheral arterial disease followed by [place], no significant carotid occlusive disease on recent ultrasound. Her dialysis today is Monday Wednesday Friday.  Past Medical History:  Diagnosis Date  . Anxiety   . Arthritis   . AVM (arteriovenous malformation) of colon   . CAD (coronary artery disease)    a. 12/2011 NSTEMI/Cath/PCI LCX (2.25x14 Resolute DES) & D1 (2.25x22 Resolute DES);  b. 01/2012 Cath/PCI: LM 30, LAD 30p, 40-69m, D1 stent ok, 99 in sm branch of diag, LCX patent stent, OM1 20, RCA 95 ost (4.0x12 Promus DES), EF 55%;  c. 04/2012 Lexi Cardiolite  EF 48%, small area of scar @ base/mid inflat wall with mild peri-infarct ischemia.; CABG 12/4  . Carotid artery disease ([place])    a. 25-95% LICA, 12/3873   . Chronic bronchitis ([place])   . Chronic diastolic CHF (congestive heart failure) ([name])    a. 02/2012 Echo EF 60-65%, nl wall motion, Gr 1 DD, mod MR  . Colon cancer ([place]) 1992  . Esophageal stricture   . ESRD on hemodialysis ([name])    ESRD due to HTN, started dialysis 2011 and gets HD at [facility] with Dr Hinda Lenis on MWF schedule.  Access is LUA AVF as of Sept 2014.   Marland Kitchen GERD (gastroesophageal reflux disease)   . High cholesterol 12/2011  . History of blood transfusion 07/2011; 12/2011; 01/2012 X 2; 04/2012  .  History of gout   . History of lower GI bleeding   . Hypertension   . Iron deficiency anemia   . Mitral regurgitation    a. Moderate by echo, 02/2012  . Myocardial infarction   . Ovarian cancer ([place]) 1992  . Pneumonia ~ 2009  . PUD (peptic ulcer disease)   . TIA (transient ischemic attack)    Past Surgical History:  Procedure Laterality Date  . ABDOMINAL HYSTERECTOMY  1992  . APPENDECTOMY  06/1990  . AV FISTULA PLACEMENT  07/2009   left upper arm  . COLON RESECTION  1992  . CORONARY ANGIOPLASTY WITH STENT PLACEMENT  12/15/11   "2"  . CORONARY ANGIOPLASTY WITH STENT PLACEMENT  y/2013   "1; makes total of 3" (05/02/2012)  . CORONARY ARTERY BYPASS GRAFT  06/13/2012   Procedure: CORONARY ARTERY BYPASS GRAFTING (CABG);  Surgeon: Grace Isaac, MD;  Location: [place];  Service: Open Heart Surgery;  Laterality: N/A;  cabg x four;  using left internal mammary artery, and left leg greater saphenous vein harvested endoscopically  . DILATION AND CURETTAGE OF UTERUS    . ESOPHAGOGASTRODUODENOSCOPY  01/20/2012   Procedure: ESOPHAGOGASTRODUODENOSCOPY (EGD);  Surgeon: Ladene Artist, MD,FACG;  Location: [facility] ENDOSCOPY;  Service: Endoscopy;  Laterality: N/A;  . ESOPHAGOGASTRODUODENOSCOPY N/A 03/26/2013   Procedure: ESOPHAGOGASTRODUODENOSCOPY (EGD);  Surgeon: Irene Shipper, MD;  Location: [facility] ENDOSCOPY;  Service: Endoscopy;  Laterality: N/A;  . ESOPHAGOGASTRODUODENOSCOPY N/A 04/30/2015   Procedure: ESOPHAGOGASTRODUODENOSCOPY (EGD);  Surgeon: Rogene Houston, MD;  [facility];  Service: Endoscopy;  Laterality: N/A;  1pm - moved to 10/20 @ 1:10  . ESOPHAGOGASTRODUODENOSCOPY N/A 07/29/2016   Procedure: ESOPHAGOGASTRODUODENOSCOPY (EGD);  Surgeon: Manus Gunning, MD;  Location: [place];  Service: Gastroenterology;  Laterality: N/A;  enteroscopy  . INTRAOPERATIVE TRANSESOPHAGEAL ECHOCARDIOGRAM  06/13/2012   Procedure: INTRAOPERATIVE TRANSESOPHAGEAL ECHOCARDIOGRAM;  Surgeon: Grace Isaac, MD;  Location: [place];  Service: Open Heart Surgery;  Laterality: N/A;  . IR GENERIC HISTORICAL  07/26/2016   IR FLUORO GUIDE CV LINE RIGHT 07/26/2016 Greggory Keen, MD [facility]  . IR GENERIC HISTORICAL  07/26/2016   IR US GUIDE VASC ACCESS RIGHT 07/26/2016 Greggory Keen, MD [facility]  . IR GENERIC HISTORICAL  08/02/2016   IR US GUIDE VASC ACCESS RIGHT 08/02/2016 Greggory Keen, MD [facility]  . IR GENERIC HISTORICAL  08/02/2016   IR FLUORO GUIDE CV LINE RIGHT 08/02/2016 Greggory Keen, MD [facility]  . LEFT HEART CATHETERIZATION WITH CORONARY ANGIOGRAM N/A 12/15/2011   Procedure: LEFT HEART CATHETERIZATION WITH CORONARY ANGIOGRAM;  Surgeon: Burnell Blanks, MD;  Location: [facility] CATH LAB;  Service: Cardiovascular;  Laterality: N/A;  . LEFT HEART CATHETERIZATION WITH CORONARY ANGIOGRAM N/A 01/10/2012   Procedure: LEFT HEART CATHETERIZATION WITH CORONARY ANGIOGRAM;  Surgeon: Peter M Martinique, MD;  Location: [facility] CATH LAB;  Service: Cardiovascular;  Laterality: N/A;  . LEFT HEART CATHETERIZATION WITH CORONARY ANGIOGRAM N/A 06/08/2012   Procedure: LEFT HEART CATHETERIZATION WITH CORONARY ANGIOGRAM;  Surgeon: Burnell Blanks, MD;  Location: [facility] CATH LAB;  Service: Cardiovascular;  Laterality: N/A;  . LEFT HEART CATHETERIZATION WITH CORONARY/GRAFT ANGIOGRAM N/A 12/10/2013   Procedure: LEFT HEART CATHETERIZATION WITH Beatrix Fetters;  Surgeon: [name], MD;  Location: [facility] CATH LAB;  Service: Cardiovascular;  Laterality: N/A;  . OVARY SURGERY     ovarian cancer  . SHUNTOGRAM N/A 10/15/2013   Procedure: Fistulogram;  Surgeon: Serafina Mitchell, MD;  Location: [facility] CATH LAB;  Service: Cardiovascular;  Laterality: N/A;  . THROMBECTOMY / ARTERIOVENOUS GRAFT REVISION  2011   left upper arm  . TUBAL LIGATION  1980's    Family History  Problem Relation Age of Onset  . Heart disease Mother     Heart Disease before age 80  . Hyperlipidemia Mother   . Hypertension Mother   .  Diabetes Mother   . Heart attack Mother   . Heart disease Father     Heart Disease before age 49  . Hyperlipidemia Father   . Hypertension Father   . Diabetes Father   . Diabetes Sister   . Hypertension Sister   . Diabetes Brother   . Hyperlipidemia Brother   . Heart attack Brother   . Hypertension Sister   . Heart attack Brother   . Other      noncontributory for early CAD  . Colon cancer Neg Hx   . Esophageal cancer Neg Hx   . Liver disease Neg Hx   . Kidney disease Neg Hx   . Colon polyps Neg Hx     SOCIAL HISTORY: Social History   Social History  . Marital status: Married    Spouse name: N/A  . Number of children: N/A  . Years of education: N/A   Occupational History  . Not on file.   Social History Main Topics  . Smoking status: Never Smoker  . Smokeless tobacco:  Never Used  . Alcohol use No  . Drug use: No  . Sexual activity: Yes    Birth control/ protection: Surgical   Other Topics Concern  . Not on file   Social History Narrative   Lives in [place], [place] with husband.  Dialysis pt - mwf.      Current Outpatient Prescriptions  Medication Sig Dispense Refill  . ALPRAZolam (XANAX) 0.25 MG tablet Take 0.25-0.5 mg by mouth at bedtime as needed for sleep.     Marland Kitchen amiodarone (PACERONE) 200 MG tablet Take 1 tablet (200 mg total) by mouth daily. 30 tablet 3  . amLODipine (NORVASC) 10 MG tablet Take 1 tablet (10 mg total) by mouth daily. 30 tablet 0  . aspirin EC 81 MG tablet Take 81 mg by mouth daily.     . cloNIDine (CATAPRES) 0.2 MG tablet Take 1 tablet (0.2 mg total) by mouth 3 (three) times daily. 90 tablet 0  . epoetin alfa (EPOGEN,PROCRIT) 67619 UNIT/ML injection Inject 1 mL (10,000 Units total) into the vein every Monday, Wednesday, and Friday with hemodialysis. 1 mL 10  . fluticasone (FLONASE) 50 MCG/ACT nasal spray Place 2 sprays into the nose daily as needed for allergies.     . hydrALAZINE (APRESOLINE) 50 MG tablet Take 1 tablet (50 mg total) by mouth  every 8 (eight) hours. 90 tablet 0  . isosorbide mononitrate (IMDUR) 30 MG 24 hr tablet Take 3 tablets (90 mg total) by mouth daily. 30 tablet 0  . lanthanum (FOSRENOL) 1000 MG chewable tablet Chew 1 tablet (1,000 mg total) by mouth 3 (three) times daily with meals. 90 tablet 1  . metoprolol tartrate (LOPRESSOR) 25 MG tablet Take 0.5 tablets (12.5 mg total) by mouth 2 (two) times daily. 60 tablet 0  . multivitamin (RENA-VIT) TABS tablet Take 1 tablet by mouth daily.    . nitroGLYCERIN (NITROSTAT) 0.4 MG SL tablet Place 1 tablet (0.4 mg total) under the tongue every 5 (five) minutes as needed for chest pain. 30 tablet 1  . pantoprazole (PROTONIX) 40 MG tablet Take 1 tablet (40 mg total) by mouth daily. 30 tablet 1  . PROAIR HFA 108 (90 BASE) MCG/ACT inhaler Inhale 1 puff into the lungs every 6 (six) hours as needed for wheezing or shortness of breath.     . SENSIPAR 30 MG tablet Take 30 mg by mouth daily with supper.     . simvastatin (ZOCOR) 20 MG tablet Take 1 tablet (20 mg total) by mouth at bedtime. 90 tablet 3   No current facility-administered medications for this visit.     ROS:   General:  No weight loss, Fever, chills  HEENT: No recent headaches, no nasal bleeding, no visual changes, no sore throat  Neurologic: No dizziness, blackouts, seizures. No recent symptoms of stroke or mini- stroke. No recent episodes of slurred speech, or temporary blindness.  Cardiac: No recent episodes of chest pain/pressure, no shortness of breath at rest.  No shortness of breath with exertion.  Denies history of atrial fibrillation or irregular heartbeat  Vascular: No history of rest pain in feet.  + history of claudication.  No history of non-healing ulcer, No history of DVT   Pulmonary: No home oxygen, no productive cough, no hemoptysis,  No asthma or wheezing  Musculoskeletal:  [ ]  Arthritis, [ ]  Low back pain,  [ ]  Joint pain  Hematologic:No history of hypercoagulable state.  No history of easy  bleeding.  No history of anemia  Gastrointestinal: No  hematochezia or melena,  No gastroesophageal reflux, no trouble swallowing  Urinary: [X]  chronic Kidney disease, [X]  on HD - [X]  MWF or [ ]  TTHS, [ ]  Burning with urination, [ ]  Frequent urination, [ ]  Difficulty urinating;   Skin: No rashes  Psychological: No history of anxiety,  No history of depression   Physical Examination  Vitals:   08/11/16 1346  BP: (!) 147/68  Pulse: 70  Resp: 20  Temp: 98.3 F (36.8 C)  TempSrc: Oral  SpO2: 95%  Weight: 125 lb (56.7 kg)  Height: 5\' 2"  (1.575 m)    Body mass index is 22.86 kg/m.  General:  Alert and oriented, no acute distress HEENT: Normal Neck: No bruit JVD Pulmonary: Clear to auscultation bilaterally Cardiac: Regular Rate and Rhythm  Abdomen: Soft, non-tender, non-distended, no mass Skin: No rash Extremity Pulses:  2+ radial, brachial Right side, occluded left upper arm AV fistula Musculoskeletal: No deformity or edema  Neurologic: Upper and lower extremity motor 5/5 and symmetric  DATA:  Recent vein mapping was reviewed. This shows a reasonable cephalic vein in the right upper arm 2-3 mm in diameter. The basilic vein is millimeters in diameter in this arm as well.  ASSESSMENT:  Discussed with the patient today the possibility of a left upper arm AV graft with less durability versus a new right arm fistula which will probably be more durable and last longer. Risks benefits possible complications of right arm AV fistula were discussed with the patient today including but not limited to bleeding infection ischemic steal non-maturation. She understands and agrees to proceed. Since she had a myocardial infarction about a week ago we will wait until the end of February for placement of her fistula.   PLAN:  Right upper extremity AV fistula brachiocephalic planned for 09/73/5329.   Ruta Hinds, MD [facility] Office: [phone] Pager:  [phone]

## 2016-08-12 DIAGNOSIS — N186 End stage renal disease: Secondary | ICD-10-CM | POA: Diagnosis not present

## 2016-08-12 DIAGNOSIS — Z992 Dependence on renal dialysis: Secondary | ICD-10-CM | POA: Diagnosis not present

## 2016-08-15 DIAGNOSIS — D631 Anemia in chronic kidney disease: Secondary | ICD-10-CM | POA: Diagnosis not present

## 2016-08-15 DIAGNOSIS — Z992 Dependence on renal dialysis: Secondary | ICD-10-CM | POA: Diagnosis not present

## 2016-08-15 DIAGNOSIS — M6281 Muscle weakness (generalized): Secondary | ICD-10-CM | POA: Diagnosis not present

## 2016-08-15 DIAGNOSIS — I10 Essential (primary) hypertension: Secondary | ICD-10-CM | POA: Diagnosis not present

## 2016-08-15 DIAGNOSIS — N186 End stage renal disease: Secondary | ICD-10-CM | POA: Diagnosis not present

## 2016-08-15 DIAGNOSIS — I5032 Chronic diastolic (congestive) heart failure: Secondary | ICD-10-CM | POA: Diagnosis not present

## 2016-08-16 ENCOUNTER — Other Ambulatory Visit: Payer: Self-pay | Admitting: Cardiovascular Disease

## 2016-08-16 DIAGNOSIS — N186 End stage renal disease: Secondary | ICD-10-CM | POA: Diagnosis not present

## 2016-08-16 DIAGNOSIS — D631 Anemia in chronic kidney disease: Secondary | ICD-10-CM | POA: Diagnosis not present

## 2016-08-16 DIAGNOSIS — M6281 Muscle weakness (generalized): Secondary | ICD-10-CM | POA: Diagnosis not present

## 2016-08-16 DIAGNOSIS — I10 Essential (primary) hypertension: Secondary | ICD-10-CM | POA: Diagnosis not present

## 2016-08-16 DIAGNOSIS — I5032 Chronic diastolic (congestive) heart failure: Secondary | ICD-10-CM | POA: Diagnosis not present

## 2016-08-17 DIAGNOSIS — Z992 Dependence on renal dialysis: Secondary | ICD-10-CM | POA: Diagnosis not present

## 2016-08-17 DIAGNOSIS — N186 End stage renal disease: Secondary | ICD-10-CM | POA: Diagnosis not present

## 2016-08-18 ENCOUNTER — Other Ambulatory Visit: Payer: Self-pay | Admitting: Cardiovascular Disease

## 2016-08-18 ENCOUNTER — Encounter: Payer: Self-pay | Admitting: Family

## 2016-08-19 ENCOUNTER — Other Ambulatory Visit: Payer: Self-pay | Admitting: Adult Health

## 2016-08-19 DIAGNOSIS — M6281 Muscle weakness (generalized): Secondary | ICD-10-CM | POA: Diagnosis not present

## 2016-08-19 DIAGNOSIS — Z992 Dependence on renal dialysis: Secondary | ICD-10-CM | POA: Diagnosis not present

## 2016-08-19 DIAGNOSIS — D631 Anemia in chronic kidney disease: Secondary | ICD-10-CM | POA: Diagnosis not present

## 2016-08-19 DIAGNOSIS — N186 End stage renal disease: Secondary | ICD-10-CM | POA: Diagnosis not present

## 2016-08-19 DIAGNOSIS — I5032 Chronic diastolic (congestive) heart failure: Secondary | ICD-10-CM | POA: Diagnosis not present

## 2016-08-19 DIAGNOSIS — I10 Essential (primary) hypertension: Secondary | ICD-10-CM | POA: Diagnosis not present

## 2016-08-19 MED ORDER — AMIODARONE HCL 200 MG PO TABS: 200.0000 mg | ORAL_TABLET | Freq: Every day | ORAL | 6 refills | Status: DC

## 2016-08-19 NOTE — Telephone Encounter (Signed)
Refill complete 

## 2016-08-19 NOTE — Telephone Encounter (Signed)
Patient needs refill on Amlodopine. / tg

## 2016-08-22 DIAGNOSIS — N186 End stage renal disease: Secondary | ICD-10-CM | POA: Diagnosis not present

## 2016-08-22 DIAGNOSIS — Z992 Dependence on renal dialysis: Secondary | ICD-10-CM | POA: Diagnosis not present

## 2016-08-22 DIAGNOSIS — Z452 Encounter for adjustment and management of vascular access device: Secondary | ICD-10-CM

## 2016-08-24 DIAGNOSIS — N186 End stage renal disease: Secondary | ICD-10-CM | POA: Diagnosis not present

## 2016-08-24 DIAGNOSIS — Z992 Dependence on renal dialysis: Secondary | ICD-10-CM | POA: Diagnosis not present

## 2016-08-25 ENCOUNTER — Encounter (INDEPENDENT_AMBULATORY_CARE_PROVIDER_SITE_OTHER): Payer: Self-pay | Admitting: Internal Medicine

## 2016-08-25 ENCOUNTER — Ambulatory Visit: Payer: Medicare HMO | Admitting: Family

## 2016-08-25 ENCOUNTER — Inpatient Hospital Stay (HOSPITAL_COMMUNITY): Admission: RE | Admit: 2016-08-25 | Payer: Medicare HMO | Source: Ambulatory Visit

## 2016-08-25 ENCOUNTER — Ambulatory Visit (INDEPENDENT_AMBULATORY_CARE_PROVIDER_SITE_OTHER): Payer: Medicare HMO | Admitting: Internal Medicine

## 2016-08-25 VITALS — BP 180/84 | HR 56 | Temp 97.5°F | Ht 62.0 in | Wt 128.0 lb

## 2016-08-25 DIAGNOSIS — K922 Gastrointestinal hemorrhage, unspecified: Secondary | ICD-10-CM

## 2016-08-25 DIAGNOSIS — C189 Malignant neoplasm of colon, unspecified: Secondary | ICD-10-CM

## 2016-08-25 NOTE — Progress Notes (Signed)
Subjective:    Patient ID: Shawna [name], female    DOB: Nov 12, 1940, 76 y.o.   MRN: [id number]  HPI Here today for f/u. Hx of bleeding from duodenal AVM.  She states she has epigastric pain.  Admitted in January for NSTEMI. She tells me she is doing fairly well. Her appetite is okay. She has  Had some weight loss.  From 136 to 128. She has a BM Daily. No melena or BRRB.  She occasionally takes a stool softener.  Her last colonoscopy was in 2013. In January she had an EGD for anemia secondary to suspected blood loss, hx of duodenal AVMS, recent NSTEMI, cath pending by DR. Renelda Loma Armbruter:   Impression:               - Esophagogastric landmarks identified.                           - 2 cm hiatal hernia.                           - Normal stomach.                           - A single duodenal versus jejunal polyp - suspect                            adenoma, not removed during this exam as above.                           - A single angiodysplastic lesion in the jejunum.                            This could be the cause for the patient's symptoms                            / anemia. Treated successfully with argon plasma                            coagulation (APC).   Hx of ESRD, hypertension Dialysis M-W-F. On dialysis for 6 yrs. CABG in 2013 and maintained on ASA 81mg . Hx of colon cancer in 1992 at [facility].  Her last colonoscopy in 2013  CBC    Component Value Date/Time   WBC 7.2 07/31/2016 0520   RBC 3.28 (L) 07/31/2016 0520   HGB 10.2 (L) 07/31/2016 0520   HCT 32.0 (L) 07/31/2016 0520   PLT 195 07/31/2016 0520   MCV 97.6 07/31/2016 0520   MCH 31.1 07/31/2016 0520   MCHC 31.9 07/31/2016 0520   RDW 17.5 (H) 07/31/2016 0520   LYMPHSABS 0.5 (L) 07/27/2016 0355   MONOABS 1.0 07/27/2016 0355   EOSABS 0.0 07/27/2016 0355   BASOSABS 0.0 07/27/2016 0355       04/30/2015 Procedure: EGD  Indications: Patient is 76 year old African-American female with multiple  medical problems who presents with abdominal pain and melena. She has history of bleeding from duodenal AV malformations about 2 years ago. She is on low-dose aspirin. Impression: Small sliding hiatal hernia with mild changes of reflux esophagitis limited to GE junction. Erosive gastroduodenitis but no evidence of peptic ulcer disease.  Mild antral telangiectasia without stigmata of bleed.    03/26/2013 EGD and small bowel enteroscopy with control of bleeding (APC):melena Dr. Henrene Pastor: Non bleeding duodenal AVM status post APC. Incidental esophageal stricture.  Her last colonoscopy was in January 2013 by Dr. Britta Mccreedy. Anemia, Hemocult +. Two polyps in the cecum, Sessile polyp in the descending colon, Anastomosis. Biopsy: All tubular adenoma.    Review of Systems Past Medical History:  Diagnosis Date  . Anxiety   . Arthritis   . AVM (arteriovenous malformation) of colon   . CAD (coronary artery disease)    a. 12/2011 NSTEMI/Cath/PCI LCX (2.25x14 Resolute DES) & D1 (2.25x22 Resolute DES);  b. 01/2012 Cath/PCI: LM 30, LAD 30p, 40-23m, D1 stent ok, 99 in sm branch of diag, LCX patent stent, OM1 20, RCA 95 ost (4.0x12 Promus DES), EF 55%;  c. 04/2012 Lexi Cardiolite  EF 48%, small area of scar @ base/mid inflat wall with mild peri-infarct ischemia.; CABG 12/4  . Carotid artery disease ([place])    a. 88-41% LICA, 12/6061   . Chronic bronchitis ([place])   . Chronic diastolic CHF (congestive heart failure) ([place])    a. 02/2012 Echo EF 60-65%, nl wall motion, Gr 1 DD, mod MR  . Colon cancer ([place]) 1992  . Esophageal stricture   . ESRD on hemodialysis ([place])    ESRD due to HTN, started dialysis 2011 and gets HD at [facility] with Dr Hinda Lenis on MWF schedule.  Access is LUA AVF as of Sept 2014.   Marland Kitchen GERD (gastroesophageal reflux disease)   . High cholesterol 12/2011  . History of blood  transfusion 07/2011; 12/2011; 01/2012 X 2; 04/2012  . History of gout   . History of lower GI bleeding   . Hypertension   . Iron deficiency anemia   . Mitral regurgitation    a. Moderate by echo, 02/2012  . Myocardial infarction   . Ovarian cancer ([place]) 1992  . Pneumonia ~ 2009  . PUD (peptic ulcer disease)   . TIA (transient ischemic attack)     Past Surgical History:  Procedure Laterality Date  . ABDOMINAL HYSTERECTOMY  1992  . APPENDECTOMY  06/1990  . AV FISTULA PLACEMENT  07/2009   left upper arm  . COLON RESECTION  1992  . CORONARY ANGIOPLASTY WITH STENT PLACEMENT  12/15/11   "2"  . CORONARY ANGIOPLASTY WITH STENT PLACEMENT  y/2013   "1; makes total of 3" (05/02/2012)  . CORONARY ARTERY BYPASS GRAFT  06/13/2012   Procedure: CORONARY ARTERY BYPASS GRAFTING (CABG);  Surgeon: Grace Isaac, MD;  Location: [place];  Service: Open Heart Surgery;  Laterality: N/A;  cabg x four;  using left internal mammary artery, and left leg greater saphenous vein harvested endoscopically  . DILATION AND CURETTAGE OF UTERUS    . ESOPHAGOGASTRODUODENOSCOPY  01/20/2012   Procedure: ESOPHAGOGASTRODUODENOSCOPY (EGD);  Surgeon: Ladene Artist, MD,FACG;  Location: [facility] ENDOSCOPY;  Service: Endoscopy;  Laterality: N/A;  . ESOPHAGOGASTRODUODENOSCOPY N/A 03/26/2013   Procedure: ESOPHAGOGASTRODUODENOSCOPY (EGD);  Surgeon: Irene Shipper, MD;  Location: [facility] ENDOSCOPY;  Service: Endoscopy;  Laterality: N/A;  . ESOPHAGOGASTRODUODENOSCOPY N/A 04/30/2015   Procedure: ESOPHAGOGASTRODUODENOSCOPY (EGD);  Surgeon: Rogene Houston, MD;  [facility];  Service: Endoscopy;  Laterality: N/A;  1pm - moved to 10/20 @ 1:10  . ESOPHAGOGASTRODUODENOSCOPY N/A 07/29/2016   Procedure: ESOPHAGOGASTRODUODENOSCOPY (EGD);  Surgeon: Manus Gunning, MD;  Location: [place];  Service: Gastroenterology;  Laterality: N/A;  enteroscopy  . INTRAOPERATIVE TRANSESOPHAGEAL ECHOCARDIOGRAM  06/13/2012   Procedure: INTRAOPERATIVE  TRANSESOPHAGEAL ECHOCARDIOGRAM;  Surgeon: Grace Isaac, MD;  Location: [place];  Service: Open Heart Surgery;  Laterality: N/A;  . IR GENERIC HISTORICAL  07/26/2016   IR FLUORO GUIDE CV LINE RIGHT 07/26/2016 Greggory Keen, MD [facility]  . IR GENERIC HISTORICAL  07/26/2016   IR US GUIDE VASC ACCESS RIGHT 07/26/2016 Greggory Keen, MD [facility]  . IR GENERIC HISTORICAL  08/02/2016   IR US GUIDE VASC ACCESS RIGHT 08/02/2016 Greggory Keen, MD [facility]  . IR GENERIC HISTORICAL  08/02/2016   IR FLUORO GUIDE CV LINE RIGHT 08/02/2016 Greggory Keen, MD [facility]  . LEFT HEART CATHETERIZATION WITH CORONARY ANGIOGRAM N/A 12/15/2011   Procedure: LEFT HEART CATHETERIZATION WITH CORONARY ANGIOGRAM;  Surgeon: Burnell Blanks, MD;  Location: [facility] CATH LAB;  Service: Cardiovascular;  Laterality: N/A;  . LEFT HEART CATHETERIZATION WITH CORONARY ANGIOGRAM N/A 01/10/2012   Procedure: LEFT HEART CATHETERIZATION WITH CORONARY ANGIOGRAM;  Surgeon: Peter M Martinique, MD;  Location: [facility] CATH LAB;  Service: Cardiovascular;  Laterality: N/A;  . LEFT HEART CATHETERIZATION WITH CORONARY ANGIOGRAM N/A 06/08/2012   Procedure: LEFT HEART CATHETERIZATION WITH CORONARY ANGIOGRAM;  Surgeon: Burnell Blanks, MD;  Location: [facility] CATH LAB;  Service: Cardiovascular;  Laterality: N/A;  . LEFT HEART CATHETERIZATION WITH CORONARY/GRAFT ANGIOGRAM N/A 12/10/2013   Procedure: LEFT HEART CATHETERIZATION WITH Beatrix Fetters;  Surgeon: [name], MD;  Location: [facility] CATH LAB;  Service: Cardiovascular;  Laterality: N/A;  . OVARY SURGERY     ovarian cancer  . SHUNTOGRAM N/A 10/15/2013   Procedure: Fistulogram;  Surgeon: Serafina Mitchell, MD;  Location: [facility] CATH LAB;  Service: Cardiovascular;  Laterality: N/A;  . THROMBECTOMY / ARTERIOVENOUS GRAFT REVISION  2011   left upper arm  . TUBAL LIGATION  1980's      Current Outpatient Prescriptions on File Prior to Visit  Medication Sig Dispense Refill  . ALPRAZolam  (XANAX) 0.25 MG tablet Take 0.25-0.5 mg by mouth at bedtime as needed for sleep.     Marland Kitchen amiodarone (PACERONE) 200 MG tablet Take 1 tablet (200 mg total) by mouth daily. 30 tablet 6  . amLODipine (NORVASC) 10 MG tablet Take 1 tablet (10 mg total) by mouth daily. 30 tablet 0  . aspirin EC 81 MG tablet Take 81 mg by mouth daily.     . cloNIDine (CATAPRES) 0.2 MG tablet Take 1 tablet (0.2 mg total) by mouth 3 (three) times daily. 90 tablet 0  . epoetin alfa (EPOGEN,PROCRIT) 82423 UNIT/ML injection Inject 1 mL (10,000 Units total) into the vein every Monday, Wednesday, and Friday with hemodialysis. 1 mL 10  . fluticasone (FLONASE) 50 MCG/ACT nasal spray Place 2 sprays into the nose daily as needed for allergies.     . hydrALAZINE (APRESOLINE) 50 MG tablet Take 1 tablet (50 mg total) by mouth every 8 (eight) hours. 90 tablet 0  . isosorbide mononitrate (IMDUR) 30 MG 24 hr tablet Take 3 tablets (90 mg total) by mouth daily. 30 tablet 0  . lanthanum (FOSRENOL) 1000 MG chewable tablet Chew 1 tablet (1,000 mg total) by mouth 3 (three) times daily with meals. 90 tablet 1  . multivitamin (RENA-VIT) TABS tablet Take 1 tablet by mouth daily.    . nitroGLYCERIN (NITROSTAT) 0.4 MG SL tablet Place 1 tablet (0.4 mg total) under the tongue every 5 (five) minutes as needed for chest pain. 30 tablet 1  . pantoprazole (PROTONIX) 40 MG tablet Take 1 tablet (40 mg total)  by mouth daily. 30 tablet 1  . PROAIR HFA 108 (90 BASE) MCG/ACT inhaler Inhale 1 puff into the lungs every 6 (six) hours as needed for wheezing or shortness of breath.     . SENSIPAR 30 MG tablet Take 30 mg by mouth daily with supper.     . simvastatin (ZOCOR) 20 MG tablet Take 1 tablet (20 mg total) by mouth at bedtime. 90 tablet 3   No current facility-administered medications on file prior to visit.        Objective:   Physical Exam Blood pressure (!) 180/84, pulse (!) 56, temperature 97.5 F (36.4 C), height 5\' 2"  (1.575 m), weight 128 lb (58.1  kg).  Alert and oriented. Skin warm and dry. Oral mucosa is moist.   . Sclera anicteric, conjunctivae is pink. Thyroid not enlarged. No cervical lymphadenopathy. Lungs clear. Heart regular rate and rhythm.  Abdomen is soft. Bowel sounds are positive. No hepatomegaly. No abdominal masses felt. No tenderness.  No edema to lower extremities.  .      Assessment & Plan:  Recent hx of UGI Bleed. EGD in January.  She will continue the Protonix. IHx of colon cancer.  will discuss with Dr. Laural Golden about a colonoscopy. Her last colonoscopy was in 2013.  CBC today

## 2016-08-25 NOTE — Patient Instructions (Signed)
CBC today.  

## 2016-08-26 DIAGNOSIS — Z992 Dependence on renal dialysis: Secondary | ICD-10-CM | POA: Diagnosis not present

## 2016-08-26 DIAGNOSIS — N186 End stage renal disease: Secondary | ICD-10-CM | POA: Diagnosis not present

## 2016-08-26 LAB — CBC WITH DIFFERENTIAL/PLATELET
BASOS PCT: 1 %
Basophils Absolute: 71 cells/uL (ref 0–200)
EOS ABS: 994 {cells}/uL — AB (ref 15–500)
Eosinophils Relative: 14 %
HCT: 37.2 % (ref 35.0–45.0)
HEMOGLOBIN: 11.4 g/dL — AB (ref 11.7–15.5)
LYMPHS PCT: 14 %
Lymphs Abs: 994 cells/uL (ref 850–3900)
MCH: 30.1 pg (ref 27.0–33.0)
MCHC: 30.6 g/dL — AB (ref 32.0–36.0)
MCV: 98.2 fL (ref 80.0–100.0)
MONO ABS: 639 {cells}/uL (ref 200–950)
MPV: 11.9 fL (ref 7.5–12.5)
Monocytes Relative: 9 %
NEUTROS PCT: 62 %
Neutro Abs: 4402 cells/uL (ref 1500–7800)
Platelets: 359 10*3/uL (ref 140–400)
RBC: 3.79 MIL/uL — ABNORMAL LOW (ref 3.80–5.10)
RDW: 17.2 % — AB (ref 11.0–15.0)
WBC: 7.1 10*3/uL (ref 3.8–10.8)

## 2016-08-29 ENCOUNTER — Telehealth (INDEPENDENT_AMBULATORY_CARE_PROVIDER_SITE_OTHER): Payer: Self-pay | Admitting: Internal Medicine

## 2016-08-29 ENCOUNTER — Encounter (INDEPENDENT_AMBULATORY_CARE_PROVIDER_SITE_OTHER): Payer: Self-pay | Admitting: Internal Medicine

## 2016-08-29 DIAGNOSIS — Z992 Dependence on renal dialysis: Secondary | ICD-10-CM | POA: Diagnosis not present

## 2016-08-29 DIAGNOSIS — N186 End stage renal disease: Secondary | ICD-10-CM | POA: Diagnosis not present

## 2016-08-29 NOTE — Telephone Encounter (Signed)
Shawna [name], Needs colonoscopy in 3 months.  I have spoken with patient.

## 2016-08-30 DIAGNOSIS — I10 Essential (primary) hypertension: Secondary | ICD-10-CM | POA: Diagnosis not present

## 2016-08-30 DIAGNOSIS — D631 Anemia in chronic kidney disease: Secondary | ICD-10-CM | POA: Diagnosis not present

## 2016-08-30 DIAGNOSIS — M6281 Muscle weakness (generalized): Secondary | ICD-10-CM | POA: Diagnosis not present

## 2016-08-30 DIAGNOSIS — I2510 Atherosclerotic heart disease of native coronary artery without angina pectoris: Secondary | ICD-10-CM | POA: Diagnosis not present

## 2016-08-30 NOTE — Telephone Encounter (Signed)
Noted in recall 

## 2016-08-31 DIAGNOSIS — N186 End stage renal disease: Secondary | ICD-10-CM | POA: Diagnosis not present

## 2016-08-31 DIAGNOSIS — Z992 Dependence on renal dialysis: Secondary | ICD-10-CM | POA: Diagnosis not present

## 2016-09-02 ENCOUNTER — Other Ambulatory Visit: Payer: Self-pay | Admitting: Cardiovascular Disease

## 2016-09-02 DIAGNOSIS — N186 End stage renal disease: Secondary | ICD-10-CM | POA: Diagnosis not present

## 2016-09-02 DIAGNOSIS — Z992 Dependence on renal dialysis: Secondary | ICD-10-CM | POA: Diagnosis not present

## 2016-09-02 MED ORDER — CLONIDINE HCL 0.2 MG PO TABS: 0.2000 mg | ORAL_TABLET | Freq: Three times a day (TID) | ORAL | 3 refills | Status: DC

## 2016-09-02 NOTE — Telephone Encounter (Signed)
REFILLED PER REQUEST

## 2016-09-02 NOTE — Telephone Encounter (Signed)
Refill on Clonidine sent to [facility] in [place]. / tg

## 2016-09-05 ENCOUNTER — Encounter (HOSPITAL_COMMUNITY): Payer: Self-pay | Admitting: *Deleted

## 2016-09-05 DIAGNOSIS — Z992 Dependence on renal dialysis: Secondary | ICD-10-CM | POA: Diagnosis not present

## 2016-09-05 DIAGNOSIS — N186 End stage renal disease: Secondary | ICD-10-CM | POA: Diagnosis not present

## 2016-09-05 NOTE — Progress Notes (Addendum)
Anesthesia Chart Review: SAME DAY WORK-UP.  Patient is a 76 year old female scheduled for right brachiocephalic AVF creation on 7/79/39 by Dr. Oneida Alar.  - Cardiologist is Kate Sable, MD, last evaluated by cardiology 08/02/16 while in the hospital.   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 Never smoker. BMI 23.5.  Pt hospitalized 1/17-1/23/18 for hypertensive emergency complicated by demand ischemia, ESRD, acute on chronic diastolic HF, GI bleed due to duodenal AVM (s/p ablation).   Meds include Xanax, amiodarone, amlodipine, aspirin 81 mg, Catapres, Epogen, Flonase, hydralazine, Imdur, Fosrenol, Rena-Vit, nitroglycerin, Protonix, ProAir HFA, Sensipar, Zocor.  Labs will be obtained DOS.   EKG 07/29/16: NSR. LVH with repolarization abnormality  Nuclear stress test 07/28/16:  1. There is a primarily fixed defect in the anterior wall and apex which is moderate in size. A small portion of it towards the base is reversible. 2. Global hypokinesis. 3. Left ventricular ejection fraction 41% 4. Non invasive risk stratification: intermediate risk.   Echo 07/28/16:  - Left ventricle: Septal and apical hypokinesis. The cavity size was normal. Wall thickness was normal. Systolic function was normal. The estimated ejection fraction was in the range of 50% to 55%. Doppler parameters are consistent with both elevated ventricular end-diastolic filling pressure and elevated left atrial filling pressure. - Mitral valve: There was mild regurgitation. Valve area by continuity equation (using LVOT flow): 1.49 cm^2. -  Left atrium: The atrium was moderately dilated. - Atrial septum: No defect or patent foramen ovale was identified. - Pulmonary arteries: PA peak pressure: 54 mm Hg (S).  Cardiac cath 12/10/13:  1. Patent LM. 2. Focal significant disease in the mid to distal LAD.  Patent diagonal branch.  LIMA to LAD is widely patent.  3. Severe restenosis of the proximal CX stent. Patent SVG to CX.  70% proximal ramus stenosis. Widely patent SVG to ramus. 4. Occluded stent at the ostium of the RCA. Widely patent SVG to distal RCA. 5. Left ventricular systolic function not assessed.  LVEDP 20 mmHg.    If labs acceptable DOS, I anticipate pt can proceed with surgery as scheduled.   Willeen Cass, FNP-BC [facility]/Anesthesiology Phone: [phone] 09/05/2016 4:08 PM

## 2016-09-05 NOTE — Progress Notes (Signed)
Pt denies SOB and chest pain. Pt made aware to stop taking otc vitamins, fish oil and herbal medications. Do not take any NSAIDs ie: Ibuprofen, Advil, Naproxen, BC and Goody Powder. Pt verbalized understanding of all pre-op instructions. Anesthesia asked to review pt history.

## 2016-09-06 ENCOUNTER — Observation Stay (HOSPITAL_BASED_OUTPATIENT_CLINIC_OR_DEPARTMENT_OTHER)
Admission: EM | Admit: 2016-09-06 | Discharge: 2016-09-08 | Disposition: A | Discharge status: Home / Self Care | Payer: Medicare HMO | Source: Home / Self Care | Attending: Emergency Medicine | Admitting: Emergency Medicine

## 2016-09-06 ENCOUNTER — Ambulatory Visit (HOSPITAL_COMMUNITY): Payer: Medicare HMO | Admitting: Vascular Surgery

## 2016-09-06 ENCOUNTER — Encounter (HOSPITAL_COMMUNITY): Payer: Self-pay | Admitting: *Deleted

## 2016-09-06 ENCOUNTER — Encounter (HOSPITAL_COMMUNITY)
Admission: RE | Disposition: A | Discharge status: Home / Self Care | Payer: Self-pay | Source: Ambulatory Visit | Attending: Vascular Surgery

## 2016-09-06 ENCOUNTER — Telehealth: Payer: Self-pay | Admitting: Vascular Surgery

## 2016-09-06 ENCOUNTER — Ambulatory Visit (HOSPITAL_COMMUNITY): Payer: Medicare HMO | Admitting: Certified Registered"

## 2016-09-06 ENCOUNTER — Emergency Department (HOSPITAL_COMMUNITY): Payer: Medicare HMO

## 2016-09-06 ENCOUNTER — Ambulatory Visit (HOSPITAL_COMMUNITY)
Admission: RE | Admit: 2016-09-06 | Discharge: 2016-09-06 | Disposition: A | Discharge status: Home / Self Care | Payer: Medicare HMO | Source: Ambulatory Visit | Attending: Vascular Surgery | Admitting: Vascular Surgery

## 2016-09-06 DIAGNOSIS — F419 Anxiety disorder, unspecified: Secondary | ICD-10-CM | POA: Diagnosis not present

## 2016-09-06 DIAGNOSIS — E7800 Pure hypercholesterolemia, unspecified: Secondary | ICD-10-CM | POA: Diagnosis not present

## 2016-09-06 DIAGNOSIS — I252 Old myocardial infarction: Secondary | ICD-10-CM | POA: Diagnosis not present

## 2016-09-06 DIAGNOSIS — N189 Chronic kidney disease, unspecified: Secondary | ICD-10-CM

## 2016-09-06 DIAGNOSIS — Z8711 Personal history of peptic ulcer disease: Secondary | ICD-10-CM | POA: Insufficient documentation

## 2016-09-06 DIAGNOSIS — J42 Unspecified chronic bronchitis: Secondary | ICD-10-CM | POA: Insufficient documentation

## 2016-09-06 DIAGNOSIS — Z8673 Personal history of transient ischemic attack (TIA), and cerebral infarction without residual deficits: Secondary | ICD-10-CM | POA: Insufficient documentation

## 2016-09-06 DIAGNOSIS — I5032 Chronic diastolic (congestive) heart failure: Secondary | ICD-10-CM | POA: Insufficient documentation

## 2016-09-06 DIAGNOSIS — Z7982 Long term (current) use of aspirin: Secondary | ICD-10-CM | POA: Diagnosis not present

## 2016-09-06 DIAGNOSIS — T82898A Other specified complication of vascular prosthetic devices, implants and grafts, initial encounter: Secondary | ICD-10-CM | POA: Insufficient documentation

## 2016-09-06 DIAGNOSIS — G458 Other transient cerebral ischemic attacks and related syndromes: Secondary | ICD-10-CM | POA: Diagnosis not present

## 2016-09-06 DIAGNOSIS — I2510 Atherosclerotic heart disease of native coronary artery without angina pectoris: Secondary | ICD-10-CM | POA: Diagnosis not present

## 2016-09-06 DIAGNOSIS — I120 Hypertensive chronic kidney disease with stage 5 chronic kidney disease or end stage renal disease: Secondary | ICD-10-CM | POA: Diagnosis not present

## 2016-09-06 DIAGNOSIS — I5033 Acute on chronic diastolic (congestive) heart failure: Secondary | ICD-10-CM | POA: Diagnosis not present

## 2016-09-06 DIAGNOSIS — M109 Gout, unspecified: Secondary | ICD-10-CM | POA: Diagnosis not present

## 2016-09-06 DIAGNOSIS — N186 End stage renal disease: Secondary | ICD-10-CM | POA: Diagnosis not present

## 2016-09-06 DIAGNOSIS — E785 Hyperlipidemia, unspecified: Secondary | ICD-10-CM | POA: Diagnosis present

## 2016-09-06 DIAGNOSIS — K5520 Angiodysplasia of colon without hemorrhage: Secondary | ICD-10-CM | POA: Diagnosis present

## 2016-09-06 DIAGNOSIS — Z8543 Personal history of malignant neoplasm of ovary: Secondary | ICD-10-CM | POA: Diagnosis not present

## 2016-09-06 DIAGNOSIS — R69 Illness, unspecified: Secondary | ICD-10-CM | POA: Diagnosis not present

## 2016-09-06 DIAGNOSIS — I132 Hypertensive heart and chronic kidney disease with heart failure and with stage 5 chronic kidney disease, or end stage renal disease: Secondary | ICD-10-CM | POA: Diagnosis not present

## 2016-09-06 DIAGNOSIS — M1990 Unspecified osteoarthritis, unspecified site: Secondary | ICD-10-CM | POA: Insufficient documentation

## 2016-09-06 DIAGNOSIS — Z85038 Personal history of other malignant neoplasm of large intestine: Secondary | ICD-10-CM | POA: Diagnosis not present

## 2016-09-06 DIAGNOSIS — D631 Anemia in chronic kidney disease: Secondary | ICD-10-CM | POA: Diagnosis present

## 2016-09-06 DIAGNOSIS — I471 Supraventricular tachycardia: Secondary | ICD-10-CM | POA: Diagnosis not present

## 2016-09-06 DIAGNOSIS — Z992 Dependence on renal dialysis: Secondary | ICD-10-CM | POA: Insufficient documentation

## 2016-09-06 DIAGNOSIS — R079 Chest pain, unspecified: Secondary | ICD-10-CM

## 2016-09-06 DIAGNOSIS — Y832 Surgical operation with anastomosis, bypass or graft as the cause of abnormal reaction of the patient, or of later complication, without mention of misadventure at the time of the procedure: Secondary | ICD-10-CM | POA: Diagnosis not present

## 2016-09-06 DIAGNOSIS — R0789 Other chest pain: Secondary | ICD-10-CM | POA: Diagnosis not present

## 2016-09-06 DIAGNOSIS — N185 Chronic kidney disease, stage 5: Secondary | ICD-10-CM | POA: Diagnosis not present

## 2016-09-06 DIAGNOSIS — E782 Mixed hyperlipidemia: Secondary | ICD-10-CM | POA: Diagnosis present

## 2016-09-06 DIAGNOSIS — K5521 Angiodysplasia of colon with hemorrhage: Secondary | ICD-10-CM | POA: Diagnosis present

## 2016-09-06 DIAGNOSIS — I739 Peripheral vascular disease, unspecified: Secondary | ICD-10-CM | POA: Diagnosis not present

## 2016-09-06 DIAGNOSIS — K219 Gastro-esophageal reflux disease without esophagitis: Secondary | ICD-10-CM | POA: Insufficient documentation

## 2016-09-06 DIAGNOSIS — I5022 Chronic systolic (congestive) heart failure: Secondary | ICD-10-CM | POA: Diagnosis present

## 2016-09-06 DIAGNOSIS — I5042 Chronic combined systolic (congestive) and diastolic (congestive) heart failure: Secondary | ICD-10-CM | POA: Diagnosis present

## 2016-09-06 DIAGNOSIS — I340 Nonrheumatic mitral (valve) insufficiency: Secondary | ICD-10-CM | POA: Diagnosis not present

## 2016-09-06 DIAGNOSIS — Z951 Presence of aortocoronary bypass graft: Secondary | ICD-10-CM

## 2016-09-06 DIAGNOSIS — Z8249 Family history of ischemic heart disease and other diseases of the circulatory system: Secondary | ICD-10-CM | POA: Diagnosis not present

## 2016-09-06 DIAGNOSIS — I509 Heart failure, unspecified: Secondary | ICD-10-CM | POA: Diagnosis not present

## 2016-09-06 HISTORY — PX: AV FISTULA PLACEMENT: SHX1204

## 2016-09-06 HISTORY — PX: AVGG REMOVAL: SHX5153

## 2016-09-06 LAB — POCT I-STAT 4, (NA,K, GLUC, HGB,HCT)
Glucose, Bld: 110 mg/dL — ABNORMAL HIGH (ref 65–99)
HCT: 33 % — ABNORMAL LOW (ref 36.0–46.0)
HEMOGLOBIN: 11.2 g/dL — AB (ref 12.0–15.0)
Potassium: 4.9 mmol/L (ref 3.5–5.1)
SODIUM: 130 mmol/L — AB (ref 135–145)

## 2016-09-06 SURGERY — REMOVAL OF ARTERIOVENOUS GORETEX GRAFT (AVGG)
Anesthesia: Monitor Anesthesia Care | Site: Arm Lower | Laterality: Right

## 2016-09-06 SURGERY — ARTERIOVENOUS (AV) FISTULA CREATION
Anesthesia: Monitor Anesthesia Care | Laterality: Right

## 2016-09-06 MED ORDER — VANCOMYCIN HCL IN DEXTROSE 1-5 GM/200ML-% IV SOLN
1000.0000 mg | INTRAVENOUS | Status: AC
  Administered 2016-09-06: 1000 mg via INTRAVENOUS
  Filled 2016-09-06: qty 200

## 2016-09-06 MED ORDER — 0.9 % SODIUM CHLORIDE (POUR BTL) OPTIME
TOPICAL | Status: DC | PRN
  Administered 2016-09-06: 1000 mL

## 2016-09-06 MED ORDER — FENTANYL CITRATE (PF) 100 MCG/2ML IJ SOLN
INTRAMUSCULAR | Status: AC
  Filled 2016-09-06: qty 2

## 2016-09-06 MED ORDER — OXYCODONE-ACETAMINOPHEN 5-325 MG PO TABS: 1.0000 | ORAL_TABLET | Freq: Four times a day (QID) | ORAL | 0 refills | Status: DC | PRN

## 2016-09-06 MED ORDER — PROPOFOL 500 MG/50ML IV EMUL
INTRAVENOUS | Status: DC | PRN
  Administered 2016-09-06: 75 ug/kg/min via INTRAVENOUS

## 2016-09-06 MED ORDER — EPHEDRINE SULFATE-NACL 50-0.9 MG/10ML-% IV SOSY
PREFILLED_SYRINGE | INTRAVENOUS | Status: DC | PRN
  Administered 2016-09-06: 5 mg via INTRAVENOUS

## 2016-09-06 MED ORDER — OXYCODONE HCL 5 MG PO TABS
ORAL_TABLET | ORAL | Status: AC
  Filled 2016-09-06: qty 1

## 2016-09-06 MED ORDER — SODIUM CHLORIDE 0.9 % IV SOLN
INTRAVENOUS | Status: DC
  Administered 2016-09-06: 07:00:00 via INTRAVENOUS

## 2016-09-06 MED ORDER — PROTAMINE SULFATE 10 MG/ML IV SOLN
INTRAVENOUS | Status: DC | PRN
  Administered 2016-09-06: 30 mg via INTRAVENOUS

## 2016-09-06 MED ORDER — EPHEDRINE 5 MG/ML INJ
INTRAVENOUS | Status: AC
  Filled 2016-09-06: qty 10

## 2016-09-06 MED ORDER — ONDANSETRON HCL 4 MG/2ML IJ SOLN
INTRAMUSCULAR | Status: AC
  Filled 2016-09-06: qty 2

## 2016-09-06 MED ORDER — HYDRALAZINE HCL 20 MG/ML IJ SOLN
INTRAMUSCULAR | Status: AC
  Administered 2016-09-06: 2.5 mg
  Filled 2016-09-06: qty 1

## 2016-09-06 MED ORDER — PROPOFOL 10 MG/ML IV BOLUS
INTRAVENOUS | Status: DC | PRN
Start: 2016-09-06 — End: 2016-09-06
  Administered 2016-09-06 (×3): 20 mg via INTRAVENOUS

## 2016-09-06 MED ORDER — HEPARIN SODIUM (PORCINE) 1000 UNIT/ML IJ SOLN
INTRAMUSCULAR | Status: DC | PRN
  Administered 2016-09-06: 5 mL via INTRAVENOUS

## 2016-09-06 MED ORDER — HYDRALAZINE HCL 20 MG/ML IJ SOLN: 2.5000 mg | Freq: Once | INTRAMUSCULAR | Status: DC

## 2016-09-06 MED ORDER — SODIUM CHLORIDE 0.9 % IV SOLN
INTRAVENOUS | Status: DC | PRN
  Administered 2016-09-06: 08:00:00

## 2016-09-06 MED ORDER — HEPARIN SODIUM (PORCINE) 1000 UNIT/ML IJ SOLN
1.6000 mL | Freq: Once | INTRAMUSCULAR | Status: AC
  Administered 2016-09-06: 1000 [IU] via INTRAVENOUS
  Filled 2016-09-06: qty 1.6

## 2016-09-06 MED ORDER — FENTANYL CITRATE (PF) 100 MCG/2ML IJ SOLN
25.0000 ug | INTRAMUSCULAR | Status: DC | PRN
  Administered 2016-09-06: 25 ug via INTRAVENOUS

## 2016-09-06 MED ORDER — PROPOFOL 1000 MG/100ML IV EMUL
INTRAVENOUS | Status: AC
  Filled 2016-09-06: qty 100

## 2016-09-06 MED ORDER — FENTANYL CITRATE (PF) 100 MCG/2ML IJ SOLN: 25.0000 ug | INTRAMUSCULAR | Status: DC | PRN

## 2016-09-06 MED ORDER — PROPOFOL 10 MG/ML IV BOLUS
INTRAVENOUS | Status: AC
  Filled 2016-09-06: qty 20

## 2016-09-06 MED ORDER — LIDOCAINE HCL (PF) 1 % IJ SOLN
INTRAMUSCULAR | Status: AC
  Filled 2016-09-06: qty 30

## 2016-09-06 MED ORDER — PROPOFOL 500 MG/50ML IV EMUL
INTRAVENOUS | Status: DC | PRN
  Administered 2016-09-06: 60 ug/kg/min via INTRAVENOUS

## 2016-09-06 MED ORDER — HYDRALAZINE HCL 20 MG/ML IJ SOLN
INTRAMUSCULAR | Status: DC | PRN
  Administered 2016-09-06 (×2): 2.5 mg via INTRAVENOUS

## 2016-09-06 MED ORDER — SODIUM CHLORIDE 0.9 % IV SOLN
INTRAVENOUS | Status: DC | PRN
  Administered 2016-09-06: 500 mL

## 2016-09-06 MED ORDER — OXYCODONE HCL 5 MG PO TABS
5.0000 mg | ORAL_TABLET | Freq: Once | ORAL | Status: AC | PRN
  Administered 2016-09-06: 5 mg via ORAL

## 2016-09-06 MED ORDER — LIDOCAINE 2% (20 MG/ML) 5 ML SYRINGE
INTRAMUSCULAR | Status: AC
  Filled 2016-09-06: qty 5

## 2016-09-06 MED ORDER — CHLORHEXIDINE GLUCONATE CLOTH 2 % EX PADS: 6.0000 | MEDICATED_PAD | Freq: Once | CUTANEOUS | Status: DC

## 2016-09-06 MED ORDER — MIDAZOLAM HCL 2 MG/2ML IJ SOLN
INTRAMUSCULAR | Status: AC
  Filled 2016-09-06: qty 2

## 2016-09-06 MED ORDER — LIDOCAINE HCL (PF) 1 % IJ SOLN
INTRAMUSCULAR | Status: DC | PRN
  Administered 2016-09-06: 17 mL

## 2016-09-06 MED ORDER — ONDANSETRON HCL 4 MG/2ML IJ SOLN: 4.0000 mg | Freq: Four times a day (QID) | INTRAMUSCULAR | Status: DC | PRN

## 2016-09-06 MED ORDER — LIDOCAINE-EPINEPHRINE (PF) 1 %-1:200000 IJ SOLN
INTRAMUSCULAR | Status: DC | PRN
  Administered 2016-09-06: 10 mL

## 2016-09-06 MED ORDER — OXYCODONE HCL 5 MG/5ML PO SOLN: 5.0000 mg | Freq: Once | ORAL | Status: AC | PRN

## 2016-09-06 MED ORDER — HYDROMORPHONE HCL 1 MG/ML IJ SOLN: 0.5000 mg | Freq: Once | INTRAMUSCULAR | Status: DC

## 2016-09-06 MED ORDER — NITROGLYCERIN IN D5W 200-5 MCG/ML-% IV SOLN: 5.0000 ug/min | Freq: Once | INTRAVENOUS | Status: DC

## 2016-09-06 MED ORDER — THROMBIN 20000 UNITS EX SOLR
CUTANEOUS | Status: AC
  Filled 2016-09-06: qty 20000

## 2016-09-06 MED ORDER — LIDOCAINE-EPINEPHRINE (PF) 1 %-1:200000 IJ SOLN
INTRAMUSCULAR | Status: AC
Start: 2016-09-06 — End: 2016-09-06
  Filled 2016-09-06: qty 30

## 2016-09-06 MED ORDER — HEPARIN SODIUM (PORCINE) 1000 UNIT/ML IJ SOLN
INTRAMUSCULAR | Status: DC | PRN
  Administered 2016-09-06: 5000 [IU] via INTRAVENOUS

## 2016-09-06 SURGICAL SUPPLY — 38 items
ARMBAND PINK RESTRICT EXTREMIT (MISCELLANEOUS) ×4 IMPLANT
CANISTER SUCT 3000ML PPV (MISCELLANEOUS) ×2 IMPLANT
CANNULA VESSEL 3MM 2 BLNT TIP (CANNULA) ×2 IMPLANT
CLIP TI MEDIUM 6 (CLIP) ×2 IMPLANT
CLIP TI WIDE RED SMALL 6 (CLIP) ×2 IMPLANT
COVER PROBE W GEL 5X96 (DRAPES) IMPLANT
DECANTER SPIKE VIAL GLASS SM (MISCELLANEOUS) IMPLANT
DERMABOND ADVANCED (GAUZE/BANDAGES/DRESSINGS) ×1
DERMABOND ADVANCED .7 DNX12 (GAUZE/BANDAGES/DRESSINGS) ×1 IMPLANT
DRAIN PENROSE 1/4X12 LTX STRL (WOUND CARE) ×2 IMPLANT
ELECT REM PT RETURN 9FT ADLT (ELECTROSURGICAL) ×2
ELECTRODE REM PT RTRN 9FT ADLT (ELECTROSURGICAL) ×1 IMPLANT
GLOVE BIO SURGEON STRL SZ 6.5 (GLOVE) ×4 IMPLANT
GLOVE BIO SURGEON STRL SZ7.5 (GLOVE) ×2 IMPLANT
GLOVE BIOGEL PI IND STRL 6.5 (GLOVE) ×1 IMPLANT
GLOVE BIOGEL PI IND STRL 7.0 (GLOVE) ×1 IMPLANT
GLOVE BIOGEL PI INDICATOR 6.5 (GLOVE) ×1
GLOVE BIOGEL PI INDICATOR 7.0 (GLOVE) ×1
GLOVE ECLIPSE 6.5 STRL STRAW (GLOVE) ×4 IMPLANT
GOWN STRL REUS W/ TWL LRG LVL3 (GOWN DISPOSABLE) ×3 IMPLANT
GOWN STRL REUS W/TWL LRG LVL3 (GOWN DISPOSABLE) ×3
GRAFT GORETEX STRT 4-7X45 (Vascular Products) ×2 IMPLANT
KIT BASIN OR (CUSTOM PROCEDURE TRAY) ×2 IMPLANT
KIT ROOM TURNOVER OR (KITS) ×2 IMPLANT
LOOP VESSEL MINI RED (MISCELLANEOUS) IMPLANT
NS IRRIG 1000ML POUR BTL (IV SOLUTION) ×2 IMPLANT
PACK CV ACCESS (CUSTOM PROCEDURE TRAY) ×2 IMPLANT
PAD ARMBOARD 7.5X6 YLW CONV (MISCELLANEOUS) ×4 IMPLANT
SPONGE SURGIFOAM ABS GEL 100 (HEMOSTASIS) IMPLANT
SUT PROLENE 6 0 CC (SUTURE) ×4 IMPLANT
SUT PROLENE 7 0 BV 1 (SUTURE) ×4 IMPLANT
SUT SILK 2 0 (SUTURE) ×1
SUT SILK 2-0 18XBRD TIE 12 (SUTURE) ×1 IMPLANT
SUT VIC AB 3-0 SH 27 (SUTURE) ×1
SUT VIC AB 3-0 SH 27X BRD (SUTURE) ×1 IMPLANT
SUT VICRYL 4-0 PS2 18IN ABS (SUTURE) ×2 IMPLANT
UNDERPAD 30X30 (UNDERPADS AND DIAPERS) ×2 IMPLANT
WATER STERILE IRR 1000ML POUR (IV SOLUTION) ×2 IMPLANT

## 2016-09-06 SURGICAL SUPPLY — 30 items
CANISTER SUCT 3000ML PPV (MISCELLANEOUS) ×2 IMPLANT
CANNULA VESSEL 3MM 2 BLNT TIP (CANNULA) IMPLANT
CLIP TI MEDIUM 6 (CLIP) ×2 IMPLANT
CLIP TI WIDE RED SMALL 6 (CLIP) ×2 IMPLANT
DECANTER SPIKE VIAL GLASS SM (MISCELLANEOUS) IMPLANT
DERMABOND ADVANCED (GAUZE/BANDAGES/DRESSINGS) ×1
DERMABOND ADVANCED .7 DNX12 (GAUZE/BANDAGES/DRESSINGS) ×1 IMPLANT
ELECT REM PT RETURN 9FT ADLT (ELECTROSURGICAL) ×2
ELECTRODE REM PT RTRN 9FT ADLT (ELECTROSURGICAL) ×1 IMPLANT
GLOVE BIO SURGEON STRL SZ 6.5 (GLOVE) ×10 IMPLANT
GLOVE BIO SURGEON STRL SZ7.5 (GLOVE) ×2 IMPLANT
GLOVE BIOGEL PI IND STRL 8 (GLOVE) ×1 IMPLANT
GLOVE BIOGEL PI INDICATOR 8 (GLOVE) ×1
GLOVE SKINSENSE NS SZ6.5 (GLOVE) ×1
GLOVE SKINSENSE STRL SZ6.5 (GLOVE) ×1 IMPLANT
GOWN STRL REUS W/ TWL LRG LVL3 (GOWN DISPOSABLE) ×3 IMPLANT
GOWN STRL REUS W/TWL LRG LVL3 (GOWN DISPOSABLE) ×3
KIT BASIN OR (CUSTOM PROCEDURE TRAY) ×2 IMPLANT
KIT ROOM TURNOVER OR (KITS) ×2 IMPLANT
NEEDLE HYPO 25GX1X1/2 BEV (NEEDLE) ×2 IMPLANT
NS IRRIG 1000ML POUR BTL (IV SOLUTION) ×2 IMPLANT
PACK CV ACCESS (CUSTOM PROCEDURE TRAY) ×2 IMPLANT
PAD ARMBOARD 7.5X6 YLW CONV (MISCELLANEOUS) ×4 IMPLANT
SPONGE SURGIFOAM ABS GEL 100 (HEMOSTASIS) IMPLANT
SUT PROLENE 6 0 BV (SUTURE) ×2 IMPLANT
SUT VIC AB 3-0 SH 27 (SUTURE) ×1
SUT VIC AB 3-0 SH 27X BRD (SUTURE) ×1 IMPLANT
SUT VICRYL 4-0 PS2 18IN ABS (SUTURE) IMPLANT
UNDERPAD 30X30 (UNDERPADS AND DIAPERS) ×2 IMPLANT
WATER STERILE IRR 1000ML POUR (IV SOLUTION) ×2 IMPLANT

## 2016-09-06 NOTE — Telephone Encounter (Signed)
-----   Message from Mena Goes, RN sent at 09/06/2016  1:50 PM EST ----- Regarding: 2-3 weeks with Dr. Oneida Alar to discuss new access   ----- Message ----- From: Gabriel Earing, PA-C Sent: 09/06/2016   1:19 PM To: [facility] Charge Pool  S/p placement of right FA AVG by Dr. Oneida Alar and subsequent removal of graft by Dr. Scot Dock.  Pt needs to f/u with Dr. Oneida Alar to determine next access in the next 2-3 weeks.  Thanks!

## 2016-09-06 NOTE — Op Note (Signed)
Procedure: Right Forearm AV graft  Preop: ESRD  Postop: ESRD  Anesthesia: General  Findings:4-7 mm PTFE end to side to antecubital deep brachial vein  Asst. Silva Bandy, PA-c   Procedure Details: The right upper extremity was prepped and draped in usual sterile fashion.  Local anesthesia was infiltrated near the antecubital crease.  A transverse incision was then made near the antecubital crease the right arm.  There were no suitable antecubital veins for outflow.   The incision was carried into the subcutaneous tissues down to level of the brachial artery.  Next the brachial artery was dissected free in the medial portion incision. The artery was  3 mm in diameter. The vessel loops were placed proximal and distal to the planned site of arteriotomy. The adjacent deep brachial vein was 3 mm in diameter and of good quality.  Next, a subcutaneous tunnel was created in a loop configuration in the forearm.  A 4-7 mm PTFE graft was then brought through this subcutaneous tunnel. The patient was given 5000 units of intravenous heparin. After appropriate circulation time, the vessel loops were used to control the artery. A longitudinal opening was made in the right brachial artery.  The 4 mm end of the graft was beveled and sewn end to side to the artery using a 6 0 prolene.  At completion of the anastomosis the artery was forward bled, backbled and thoroughly flushed.  The anastomosis was secured, vessel loops were released and there was palpable pulse in the graft.  The graft was clamped just above the arterial anastomosis with a fistula clamp. The graft was then pulled taut to length.  The vein was controlled with a fine bulldog clamp.  A longitudinal opening was made in the vein.  The distal end of the graft was then beveled and sewn end to side to the vein using a running 6 0 prolene.  Just prior to completion of the anastomosis, everything was forward bled, back bled and thoroughly flushed.  The anastomosis  was secured and the fistula clamp removed from the proximal graft.  A thrill was immediately palpable in the graft.  After hemostasis was obtained, the subcutaneous tissues were reapproximated using a running 3-0 Vicryl suture. The skin was then closed with a 4 0 Vicryl subcuticular stitch. Dermabond was applied to the skin incisions.  The patient tolerated the procedure well and there were no complications.  Instrument sponge and needle count was correct at the end of the case.  The patient was taken to the recovery room in stable condition. The patient had a  radial doppler signal at the end of the case.  Ruta Hinds, MD [facility] Office: [phone] Pager: [phone]

## 2016-09-06 NOTE — Op Note (Signed)
    NAME: Shawna [name]    MRN: [id number] DOB: Dec 26, 1940    DATE OF OPERATION: 09/06/2016  PREOP DIAGNOSIS: Steal syndrome right upper extremity status post right arm AV graft  POSTOP DIAGNOSIS: Same  PROCEDURE:  1. Removal of right forearm loop AV graft 2. Vein patch angioplasty of the right brachial artery  SURGEON: Judeth Cornfield. Scot Dock, MD, FACS  ASSIST: Leontine Locket, PA  ANESTHESIA: Local with sedation   EBL: Minimal  INDICATIONS: Shawna [name] is a 76 y.o. female who undergone placement of a right forearm AV graft earlier today. She developed significant steal syndrome and therefore removal of the graft was recommended.   FINDINGS:  good radial and ulnar signal with the Doppler at the completion of the procedure.  TECHNIQUE:  The patient was taken to the operative room and sedated lesion. The right upper extremity was prepped and draped in the usual sterile fashion. After the skin was anesthetized with 1 lidocaine, the previous sutures were removed. The vein proximal and distal to the venous anastomosis was ligated. The graft was clamped. The venous anastomosis was taken down and the segment of vein opened longitudinally to be used as a vein patch. The patient was heparinized. The brachial artery was clamped proximal and distal to the anastomosis and the graft was removed from the artery. The vein patch was then sewn using continuous 6-0 Prolene suture. At the completion that was a good radial and ulnar signal with the Doppler. Hemostasis was obtained in the wound. The wound was closed the peritoneal Vicryl and skin closed with 4-0 Vicryl. Sterile dressing was applied. The patient tolerated the procedure well and was transferred to the recovery room in stable condition. All needle and sponge counts were correct.  [name], MD, FACS [facility]  DATE OF DICTATION:   09/06/2016

## 2016-09-06 NOTE — Interval H&P Note (Signed)
History and Physical Interval Note:  09/06/2016 7:25 AM  Shawna [name]  has presented today for surgery, with the diagnosis of End stage renal disease N18.6  The various methods of treatment have been discussed with the patient and family. After consideration of risks, benefits and other options for treatment, the patient has consented to  Procedure(s): BRACHIOCEPHALIC ARTERIOVENOUS (AV) FISTULA CREATION (Right) as a surgical intervention .  The patient's history has been reviewed, patient examined, no change in status, stable for surgery.  I have reviewed the patient's chart and labs.  Questions were answered to the patient's satisfaction.     Ruta Hinds

## 2016-09-06 NOTE — Anesthesia Preprocedure Evaluation (Signed)
Anesthesia Evaluation  Patient identified by MRN, date of birth, ID band Patient awake    Reviewed: Allergy & Precautions, H&P , NPO status , Patient's Chart, lab work & pertinent test results  Airway Mallampati: II   Neck ROM: full    Dental  (+) Edentulous Upper   Pulmonary    breath sounds clear to auscultation       Cardiovascular hypertension, + CAD, + Past MI, + Cardiac Stents, + CABG, + Peripheral Vascular Disease and +CHF   Rhythm:regular Rate:Normal     Neuro/Psych PSYCHIATRIC DISORDERS Anxiety TIA   GI/Hepatic GERD  ,  Endo/Other    Renal/GU ESRF and DialysisRenal disease     Musculoskeletal  (+) Arthritis ,   Abdominal   Peds  Hematology   Anesthesia Other Findings   Reproductive/Obstetrics                             Anesthesia Physical Anesthesia Plan  ASA: III  Anesthesia Plan: MAC   Post-op Pain Management:    Induction: Intravenous  Airway Management Planned: Simple Face Mask and Natural Airway  Additional Equipment:   Intra-op Plan:   Post-operative Plan:   Informed Consent: I have reviewed the patients History and Physical, chart, labs and discussed the procedure including the risks, benefits and alternatives for the proposed anesthesia with the patient or authorized representative who has indicated his/her understanding and acceptance.     Plan Discussed with: CRNA  Anesthesia Plan Comments:         Anesthesia Quick Evaluation

## 2016-09-06 NOTE — Progress Notes (Signed)
Pt with swelling noted to arm , Dr Scot Dock aware and will be by to see pt

## 2016-09-06 NOTE — Anesthesia Postprocedure Evaluation (Addendum)
Anesthesia Post Note  Patient: Shawna [name]  Procedure(s) Performed: Procedure(s) (LRB): RIGHT FOREARM ARTERIOVENOUS (AV) GRAFT (Right)  Patient location during evaluation: PACU Anesthesia Type: MAC Level of consciousness: awake and alert Pain management: pain level controlled Vital Signs Assessment: post-procedure vital signs reviewed and stable Respiratory status: spontaneous breathing, nonlabored ventilation, respiratory function stable and patient connected to nasal cannula oxygen Cardiovascular status: stable and blood pressure returned to baseline Anesthetic complications: no       Last Vitals:  Vitals:   09/06/16 1013 09/06/16 1022  BP:  (!) 166/63  Pulse:  62  Resp:  16  Temp: 36.1 C 36.7 C    Last Pain:  Vitals:   09/06/16 1022  TempSrc: Oral                 Jes Costales S

## 2016-09-06 NOTE — ED Provider Notes (Signed)
[facility] DEPT Provider Note   CSN: [id number] Arrival date & time: 09/06/16  2318   By signing my name below, I, Shawna [name], attest that this documentation has been prepared under the direction and in the presence of Shawna Essex, MD. Electronically Signed: Hilbert [name], Scribe. 09/06/16. 11:55 PM. History   Chief Complaint Chief Complaint  Patient presents with  . Chest Pain     The history is provided by the patient. No language interpreter was used.  HPI Comments: Shawna [name] is a 76 y.o. female who presents to the Emergency Department complaining of constant centralized CP that began 2 hours ago. She states that the pain radiates to her neck and to her back between her shoulder blades. She rates the pain 8/10. She also reports left-sided chest tightness. She states that she has had this pain in the past but it has never lasted this long. The patient states that she has taken NTG x 3 and ASA x 2 with minimal relief. She denies SOB and vomiting. The patient had surgery with Dr. Oneida Alar earlier today in which her Fistula was placed and then removed due to steal syndrome. She went to her MWF dialysis appointment yesterday which she has been on for 6 years. The patient states that she has had a CABG and a stent in the past. She does not make any urine.   Past Medical History:  Diagnosis Date  . Anxiety   . Arthritis   . AVM (arteriovenous malformation) of colon   . CAD (coronary artery disease)    a. 12/2011 NSTEMI/Cath/PCI LCX (2.25x14 Resolute DES) & D1 (2.25x22 Resolute DES);  b. 01/2012 Cath/PCI: LM 30, LAD 30p, 40-21m, D1 stent ok, 99 in sm branch of diag, LCX patent stent, OM1 20, RCA 95 ost (4.0x12 Promus DES), EF 55%;  c. 04/2012 Lexi Cardiolite  EF 48%, small area of scar @ base/mid inflat wall with mild peri-infarct ischemia.; CABG 12/4  . Carotid artery disease ([place])    a. 60-63% LICA, 0/1601   . Chronic bronchitis ([place])   . Chronic diastolic CHF (congestive  heart failure) ([place])    a. 02/2012 Echo EF 60-65%, nl wall motion, Gr 1 DD, mod MR  . Colon cancer ([place]) 1992  . Esophageal stricture   . ESRD on hemodialysis ([place])    ESRD due to HTN, started dialysis 2011 and gets HD at [facility] with Dr Hinda Lenis on MWF schedule.  Access is LUA AVF as of Sept 2014.   Marland Kitchen GERD (gastroesophageal reflux disease)   . High cholesterol 12/2011  . History of blood transfusion 07/2011; 12/2011; 01/2012 X 2; 04/2012  . History of gout   . History of lower GI bleeding   . Hypertension   . Iron deficiency anemia   . Mitral regurgitation    a. Moderate by echo, 02/2012  . Myocardial infarction   . Ovarian cancer ([place]) 1992  . Pneumonia ~ 2009  . PUD (peptic ulcer disease)   . TIA (transient ischemic attack)     Patient Active Problem List   Diagnosis Date Noted  . Encounter for fitting and adjustment of vascular catheter   . End stage renal disease ([place])   . ESRD (end stage renal disease) ([place])   . Heme positive stool   . Demand ischemia ([place]) 07/27/2016  . Hypertensive emergency 07/08/2016  . Respiratory failure ([place])   . Cardiac arrest ([place])   . Palliative care encounter   . DNR (do not resuscitate)  discussion   . Goals of care, counseling/discussion   . Hypertensive crisis without congestive heart failure 05/09/2016  . Acute pulmonary edema ([place]) 04/06/2016  . Acute respiratory failure ([name]) 04/06/2016  . Hypertensive crisis 01/27/2016  . History of colon cancer 01/27/2016  . History of ovarian cancer 01/27/2016  . Hypertensive urgency 01/27/2016  . Narrow complex tachycardia ([place]) 09/08/2015  . SVT (supraventricular tachycardia) ([place]) 09/08/2015  . Influenza A 08/30/2015  . Acute on chronic diastolic CHF (congestive heart failure) ([place]) 05/04/2015  . Unstable angina ([place]) 05/03/2015  . Essential hypertension   . Pain in joint, lower leg 08/14/2014  . Chest pain 11/26/2013  . Small bowel obstruction, partial 05/29/2013  . Chronic diastolic CHF  (congestive heart failure) ([place]) 03/22/2013  . GI bleeding 03/21/2013  . Acute blood loss anemia 03/21/2013  . Vaginal [name] 03/12/2013  . Vaginal discharge 03/12/2013  . Occlusion and stenosis of carotid artery without mention of cerebral infarction 01/24/2013  . Hx of CABG 07/05/2012  . Carotid artery disease ([place]) 07/05/2012  . Anemia of chronic kidney failure 07/03/2012  . Secondary hyperparathyroidism ([place]) 07/03/2012  . Mitral regurgitation 06/12/2012  . Pneumonia 06/09/2012  . Non-STEMI (non-ST elevated myocardial infarction) ([place]) 06/08/2012  . AVM (arteriovenous malformation) of small bowel, acquired ([place]) 01/20/2012  . GERD (gastroesophageal reflux disease) 01/09/2012  . HLD (hyperlipidemia) 01/05/2012  . Coronary atherosclerosis of native coronary artery 12/16/2011  . ESRD on hemodialysis ([place]) 12/16/2011    Past Surgical History:  Procedure Laterality Date  . ABDOMINAL HYSTERECTOMY  1992  . APPENDECTOMY  06/1990  . AV FISTULA PLACEMENT  07/2009   left upper arm  . COLON RESECTION  1992  . CORONARY ANGIOPLASTY WITH STENT PLACEMENT  12/15/11   "2"  . CORONARY ANGIOPLASTY WITH STENT PLACEMENT  y/2013   "1; makes total of 3" (05/02/2012)  . CORONARY ARTERY BYPASS GRAFT  06/13/2012   Procedure: CORONARY ARTERY BYPASS GRAFTING (CABG);  Surgeon: Grace Isaac, MD;  Location: [facility];  Service: Open Heart Surgery;  Laterality: N/A;  cabg x four;  using left internal mammary artery, and left leg greater saphenous vein harvested endoscopically  . DILATION AND CURETTAGE OF UTERUS    . ESOPHAGOGASTRODUODENOSCOPY  01/20/2012   Procedure: ESOPHAGOGASTRODUODENOSCOPY (EGD);  Surgeon: Ladene Artist, MD,FACG;  Location: [facility] ENDOSCOPY;  Service: Endoscopy;  Laterality: N/A;  . ESOPHAGOGASTRODUODENOSCOPY N/A 03/26/2013   Procedure: ESOPHAGOGASTRODUODENOSCOPY (EGD);  Surgeon: Irene Shipper, MD;  Location: [facility] ENDOSCOPY;  Service: Endoscopy;  Laterality: N/A;  . ESOPHAGOGASTRODUODENOSCOPY N/A  04/30/2015   Procedure: ESOPHAGOGASTRODUODENOSCOPY (EGD);  Surgeon: Rogene Houston, MD;  [facility];  Service: Endoscopy;  Laterality: N/A;  1pm - moved to 10/20 @ 1:10  . ESOPHAGOGASTRODUODENOSCOPY N/A 07/29/2016   Procedure: ESOPHAGOGASTRODUODENOSCOPY (EGD);  Surgeon: Manus Gunning, MD;  Location: [facility];  Service: Gastroenterology;  Laterality: N/A;  enteroscopy  . INTRAOPERATIVE TRANSESOPHAGEAL ECHOCARDIOGRAM  06/13/2012   Procedure: INTRAOPERATIVE TRANSESOPHAGEAL ECHOCARDIOGRAM;  Surgeon: Grace Isaac, MD;  Location: [facility];  Service: Open Heart Surgery;  Laterality: N/A;  . IR GENERIC HISTORICAL  07/26/2016   IR FLUORO GUIDE CV LINE RIGHT 07/26/2016 Greggory Keen, MD [facility]-INTER[facility] RAD  . IR GENERIC HISTORICAL  07/26/2016   IR US GUIDE VASC ACCESS RIGHT 07/26/2016 Greggory Keen, MD [facility]  . IR GENERIC HISTORICAL  08/02/2016   IR US GUIDE VASC ACCESS RIGHT 08/02/2016 Greggory Keen, MD [facility]  . IR GENERIC HISTORICAL  08/02/2016   IR FLUORO GUIDE  CV LINE RIGHT 08/02/2016 Greggory Keen, MD [facility]  . LEFT HEART CATHETERIZATION WITH CORONARY ANGIOGRAM N/A 12/15/2011   Procedure: LEFT HEART CATHETERIZATION WITH CORONARY ANGIOGRAM;  Surgeon: Burnell Blanks, MD;  Location: [facility] CATH LAB;  Service: Cardiovascular;  Laterality: N/A;  . LEFT HEART CATHETERIZATION WITH CORONARY ANGIOGRAM N/A 01/10/2012   Procedure: LEFT HEART CATHETERIZATION WITH CORONARY ANGIOGRAM;  Surgeon: Peter M Martinique, MD;  Location: [facility] CATH LAB;  Service: Cardiovascular;  Laterality: N/A;  . LEFT HEART CATHETERIZATION WITH CORONARY ANGIOGRAM N/A 06/08/2012   Procedure: LEFT HEART CATHETERIZATION WITH CORONARY ANGIOGRAM;  Surgeon: Burnell Blanks, MD;  Location: [facility] CATH LAB;  Service: Cardiovascular;  Laterality: N/A;  . LEFT HEART CATHETERIZATION WITH CORONARY/GRAFT ANGIOGRAM N/A 12/10/2013   Procedure: LEFT HEART CATHETERIZATION WITH Beatrix Fetters;  Surgeon: [name], MD;  Location: [facility] CATH LAB;  Service: Cardiovascular;  Laterality: N/A;  . OVARY SURGERY     ovarian cancer  . SHUNTOGRAM N/A 10/15/2013   Procedure: Fistulogram;  Surgeon: Serafina Mitchell, MD;  Location: [facility] CATH LAB;  Service: Cardiovascular;  Laterality: N/A;  . THROMBECTOMY / ARTERIOVENOUS GRAFT REVISION  2011   left upper arm  . TUBAL LIGATION  1980's    OB History    Gravida Para Term Preterm AB Living   2 2   2   2    SAB TAB Ectopic Multiple Live Births                   Home Medications    Prior to Admission medications   Medication Sig Start Date End Date Taking? Authorizing Provider  ALPRAZolam (XANAX) 0.25 MG tablet Take 0.25-0.5 mg by mouth at bedtime as needed for sleep.  11/19/15   Historical Provider, MD  amiodarone (PACERONE) 200 MG tablet Take 1 tablet (200 mg total) by mouth daily. 08/19/16   Herminio Commons, MD  amLODipine (NORVASC) 10 MG tablet Take 1 tablet (10 mg total) by mouth daily. 08/02/16   Kinnie Feil, MD  aspirin EC 81 MG tablet Take 81 mg by mouth daily.     Historical Provider, MD  cloNIDine (CATAPRES) 0.2 MG tablet Take 1 tablet (0.2 mg total) by mouth 3 (three) times daily. 09/02/16   Herminio Commons, MD  epoetin alfa (EPOGEN,PROCRIT) 06237 UNIT/ML injection Inject 1 mL (10,000 Units total) into the vein every Monday, Wednesday, and Friday with hemodialysis. 04/11/16   Orvan Falconer, MD  fluticasone (FLONASE) 50 MCG/ACT nasal spray Place 2 sprays into the nose daily as needed for allergies.     Historical Provider, MD  hydrALAZINE (APRESOLINE) 50 MG tablet Take 1 tablet (50 mg total) by mouth every 8 (eight) hours. 07/10/16   Thurnell Lose, MD  isosorbide mononitrate (IMDUR) 30 MG 24 hr tablet Take 3 tablets (90 mg total) by mouth daily. 08/03/16   Kinnie Feil, MD  lanthanum (FOSRENOL) 1000 MG chewable tablet Chew 1 tablet (1,000 mg total) by mouth 3 (three) times daily with meals. 04/08/16   Orvan Falconer, MD  multivitamin (RENA-VIT) TABS  tablet Take 1 tablet by mouth daily.    Historical Provider, MD  nitroGLYCERIN (NITROSTAT) 0.4 MG SL tablet Place 1 tablet (0.4 mg total) under the tongue every 5 (five) minutes as needed for chest pain. 08/02/16   Kinnie Feil, MD  oxyCODONE-acetaminophen (PERCOCET/ROXICET) 5-325 MG tablet Take 1 tablet by mouth every 6 (six) hours as needed. 09/06/16   Alvia Grove, PA-C  pantoprazole (PROTONIX)  40 MG tablet Take 1 tablet (40 mg total) by mouth daily. 08/03/16   Kinnie Feil, MD  PROAIR HFA 108 (90 BASE) MCG/ACT inhaler Inhale 1 puff into the lungs every 6 (six) hours as needed for wheezing or shortness of breath.  01/09/15   Historical Provider, MD  SENSIPAR 30 MG tablet Take 30 mg by mouth daily with supper.  12/21/12   Historical Provider, MD  simvastatin (ZOCOR) 20 MG tablet Take 1 tablet (20 mg total) by mouth at bedtime. 04/04/16   Lendon Colonel, NP    Family History Family History  Problem Relation Age of Onset  . Heart disease Mother     Heart Disease before age 8  . Hyperlipidemia Mother   . Hypertension Mother   . Diabetes Mother   . Heart attack Mother   . Heart disease Father     Heart Disease before age 11  . Hyperlipidemia Father   . Hypertension Father   . Diabetes Father   . Diabetes Sister   . Hypertension Sister   . Diabetes Brother   . Hyperlipidemia Brother   . Heart attack Brother   . Hypertension Sister   . Heart attack Brother   . Other      noncontributory for early CAD  . Colon cancer Neg Hx   . Esophageal cancer Neg Hx   . Liver disease Neg Hx   . Kidney disease Neg Hx   . Colon polyps Neg Hx     Social History Social History  Substance Use Topics  . Smoking status: Never Smoker  . Smokeless tobacco: Never Used  . Alcohol use No     Allergies   Aspirin; Penicillins; Amlodipine; Bactrim [sulfamethoxazole-trimethoprim]; Contrast media [iodinated diagnostic agents]; Iron; Nitrofurantoin; Prilosec [omeprazole]; Tylenol  [acetaminophen]; Dexilant [dexlansoprazole]; Levaquin [levofloxacin in d5w]; Morphine and related; Plavix [clopidogrel bisulfate]; Protonix [pantoprazole sodium]; and Venofer [ferric oxide]   Review of Systems Review of Systems A complete 10 system review of systems was obtained and all systems are negative except as noted in the HPI and PMH.   Physical Exam Updated Vital Signs BP (!) 161/103 (BP Location: Left Leg)   Pulse 86   Resp 16   Ht 5\' 2"  (1.575 m)   Wt 131 lb (59.4 kg)   SpO2 96%   BMI 23.96 kg/m   Physical Exam  Constitutional: She is oriented to person, place, and time. She appears well-developed and well-nourished. No distress.  HENT:  Head: Normocephalic and atraumatic.  Mouth/Throat: Oropharynx is clear and moist. No oropharyngeal exudate.  Eyes: Conjunctivae and EOM are normal. Pupils are equal, round, and reactive to light.  Neck: Normal range of motion. Neck supple. Carotid bruit is not present.  No meningismus. No bruit.   Cardiovascular: Normal rate, regular rhythm, normal heart sounds and intact distal pulses.   No murmur heard. Pulmonary/Chest: Effort normal and breath sounds normal. No respiratory distress.  Abdominal: Soft. There is no tenderness. There is no rebound and no guarding.  Musculoskeletal: Normal range of motion. She exhibits no edema or tenderness.  Dialysis catheter to right chest appears clean. Fistula to upper left extremity. Right arm: Recent surgery incision with mild erythema. Intact radial pulse.  Neurological: She is alert and oriented to person, place, and time. No cranial nerve deficit. She exhibits normal muscle tone. Coordination normal.   5/5 strength throughout. CN 2-12 intact.Equal grip strength.   Skin: Skin is warm.  Psychiatric: She has a normal mood and affect.  Her behavior is normal.  Nursing note and vitals reviewed.    ED Treatments / Results  DIAGNOSTIC STUDIES: Oxygen Saturation is 96% on RA, adequate by my  interpretation.    COORDINATION OF CARE: 11:37 PM Discussed treatment plan with pt at bedside and pt agreed to plan. I will check the patient EKG, CXR, and labs.   Labs (all labs ordered are listed, but only abnormal results are displayed) Labs Reviewed  TROPONIN I - Abnormal; Notable for the following:       Result Value   Troponin I 0.03 (*)    All other components within normal limits  CBC - Abnormal; Notable for the following:    WBC 11.0 (*)    RDW 17.2 (*)    All other components within normal limits  DIFFERENTIAL - Abnormal; Notable for the following:    Neutro Abs 8.6 (*)    All other components within normal limits  COMPREHENSIVE METABOLIC PANEL - Abnormal; Notable for the following:    Sodium 129 (*)    Chloride 91 (*)    CO2 21 (*)    Glucose, Bld 156 (*)    BUN 62 (*)    Creatinine, Ser 9.48 (*)    Calcium 8.2 (*)    ALT 9 (*)    GFR calc non Af Amer 4 (*)    GFR calc Af Amer 4 (*)    Anion gap 17 (*)    All other components within normal limits  PROTIME-INR  APTT  TROPONIN I  TROPONIN I  TROPONIN I  POC OCCULT BLOOD, ED    EKG  EKG Interpretation  Date/Time:  Tuesday September 06 2016 23:50:30 EST Ventricular Rate:  80 PR Interval:    QRS Duration: 107 QT Interval:  454 QTC Calculation: 524 R Axis:   79 Text Interpretation:  Sinus rhythm Incomplete left bundle branch block LVH with secondary repolarization abnormality Anterior ST elevation, probably due to LVH Prolonged QT interval No significant change was found similar to earlier today Confirmed by Wyvonnia Dusky  MD, [place] [phone]) on 09/07/2016 1:00:53 AM       Radiology Dg Chest 2 View  Result Date: 09/07/2016 CLINICAL DATA:  Acute onset of central chest pain, radiating to the back. Initial encounter. EXAM: CHEST  2 VIEW COMPARISON:  Chest radiograph performed 07/27/2016 FINDINGS: The lungs are well-aerated. Vascular congestion is noted. Bibasilar airspace opacities may reflect mild interstitial  edema. Small bilateral pleural effusions are seen. No pneumothorax is seen. The heart is borderline enlarged. The patient is status post median sternotomy, with evidence of prior CABG. A right-sided dual-lumen catheter is noted ending about the cavoatrial junction. No acute osseous abnormalities are seen. Scattered calcification is seen along the abdominal aorta. IMPRESSION: 1. Vascular congestion and borderline cardiomegaly. Bibasilar airspace opacities may reflect mild interstitial edema. Small bilateral pleural effusions seen. 2. Scattered aortic atherosclerosis. Electronically Signed   By: Garald Balding M.D.   On: 09/07/2016 02:36    Procedures Procedures (including critical care time)  Medications Ordered in ED Medications  HYDROmorphone (DILAUDID) injection 0.5 mg (not administered)  nitroGLYCERIN 50 mg in dextrose 5 % 250 mL (0.2 mg/mL) infusion (not administered)     Initial Impression / Assessment and Plan / ED Course  I have reviewed the triage vital signs and the nursing notes.  Pertinent labs & imaging results that were available during my care of the patient were reviewed by me and considered in my medical decision making (see chart for  details).     Episode of chest pain radiates to back x 2hours.  Associated with Shortness of breath and nausea. Took 3 NTG and ASA at home. Patent grafts on most recent cath.   EKG shows new ST depressions inferior and laterally, elevated in aVR. D/w Dr. Claiborne Billings who feels this is consistent with LVH and not STEMI.  Delay in lab draw as patient with limited IV access.  Nonfunctioning LUE fistula.  R arm with recent surgery. EJ infiltrated. Patient insistent that blood be drawn from her dialysis catheter. It was explained to her multiple times that this is not able to be used for blood draw.  Patient eventually allowed femoral stick which hemolyzed.  Patient eventually did allow second EJ attempt which was successful.   Aspiration of  blood/fluid Performed by: Shawna [name] Consent obtained. Required items: required blood products, implants, devices, and special equipment available Patient identity confirmed: verbally with patient Time out: Immediately prior to procedure a "time out" was called to verify the correct patient, procedure, equipment, support staff and site/side marked as required. Preparation: Patient was prepped and draped in the usual sterile fashion. Patient tolerance: Patient tolerated the procedure well with no immediate complications.  Location of aspiration: R femoral vein  Angiocath insertion Performed by: Shawna [name]  Consent: Verbal consent obtained. Risks and benefits: risks, benefits and alternatives were discussed Time out: Immediately prior to procedure a "time out" was called to verify the correct patient, procedure, equipment, support staff and site/side marked as required.  Preparation: Patient was prepped and draped in the usual sterile fashion.  Vein Location: R EJ  Not Ultrasound Guided  Gauge: 20  Normal blood return and flush without difficulty Patient tolerance: Patient tolerated the procedure well with no immediate complications.     K 5.0. Some congestion on CXR.  No hypoxia or increased work of breathing. Patient due for dialysis in AM.  Troponin minimally elevated. Patient remains chest pain-free. Discussed with Dr. Aundra Dubin of cardiology. He feels no indication for emergent transfer given minimal troponin elevation and patient remained chest pain-free. Feels she should be admitted to the hospitalist service either at [facility] or [facility]. Does not recommend heparin unless her enzymes trended up given her history of GI bleed.   Observation admission d/w Dr. Shanon Brow.   CRITICAL CARE Performed by: Shawna [name] Total critical care time: 35 minutes Critical care time was exclusive of separately billable procedures and treating other patients. Critical care was  necessary to treat or prevent imminent or life-threatening deterioration. Critical care was time spent personally by me on the following activities: development of treatment plan with patient and/or surrogate as well as nursing, discussions with consultants, evaluation of patient's response to treatment, examination of patient, obtaining history from patient or surrogate, ordering and performing treatments and interventions, ordering and review of laboratory studies, ordering and review of radiographic studies, pulse oximetry and re-evaluation of patient's condition.   Final Clinical Impressions(s) / ED Diagnoses   Final diagnoses:  Chest pain, unspecified type  ESRD (end stage renal disease) ([place])    New Prescriptions New Prescriptions   No medications on file   I personally performed the services described in this documentation, which was scribed in my presence. The recorded information has been reviewed and is accurate.    Shawna Essex, MD 09/07/16 [phone]

## 2016-09-06 NOTE — ED Triage Notes (Signed)
Pt c/o central chest pain that started x 2 hours ago and pt states the pain radiates around to mid back; pt was at [facility] today and had an attempt at an AV graft; pt states they put it in but had to take back out due to numbness to the hand; pt states she took 3 nitro tablets and 2 81mg  asa tabs with no relief

## 2016-09-06 NOTE — Anesthesia Preprocedure Evaluation (Signed)
Anesthesia Evaluation  Patient identified by MRN, date of birth, ID band Patient awake    Reviewed: Allergy & Precautions, H&P , NPO status , Patient's Chart, lab work & pertinent test results  Airway Mallampati: II   Neck ROM: full    Dental   Pulmonary    breath sounds clear to auscultation       Cardiovascular hypertension, + CAD, + Past MI, + Cardiac Stents, + CABG, + Peripheral Vascular Disease and +CHF   Rhythm:regular Rate:Normal     Neuro/Psych PSYCHIATRIC DISORDERS Anxiety TIA   GI/Hepatic GERD  ,  Endo/Other    Renal/GU ESRF and DialysisRenal disease     Musculoskeletal  (+) Arthritis ,   Abdominal   Peds  Hematology   Anesthesia Other Findings   Reproductive/Obstetrics                             Anesthesia Physical Anesthesia Plan  ASA: III  Anesthesia Plan: MAC   Post-op Pain Management:    Induction: Intravenous  Airway Management Planned: Simple Face Mask  Additional Equipment:   Intra-op Plan:   Post-operative Plan:   Informed Consent: I have reviewed the patients History and Physical, chart, labs and discussed the procedure including the risks, benefits and alternatives for the proposed anesthesia with the patient or authorized representative who has indicated his/her understanding and acceptance.     Plan Discussed with: CRNA, Anesthesiologist and Surgeon  Anesthesia Plan Comments:         Anesthesia Quick Evaluation

## 2016-09-06 NOTE — Telephone Encounter (Signed)
Sched appt 09/29/16 at 9:45. Lm on hm# to inform pt of appt.

## 2016-09-06 NOTE — Anesthesia Procedure Notes (Signed)
Procedure Name: MAC Date/Time: 09/06/2016 7:38 AM Performed by: Mervyn Gay Pre-anesthesia Checklist: Patient identified, Patient being monitored, Timeout performed, Emergency Drugs available and Suction available Patient Re-evaluated:Patient Re-evaluated prior to inductionOxygen Delivery Method: Simple face mask and Nasal cannula Preoxygenation: Pre-oxygenation with 100% oxygen Number of attempts: 1 Placement Confirmation: positive ETCO2 Dental Injury: Teeth and Oropharynx as per pre-operative assessment

## 2016-09-06 NOTE — H&P (View-Only) (Signed)
Referring Physician: Dr Lorrene Reid  Patient name: Shawna [name] MRN: [id number] DOB: 1940/08/02 Sex: female  REASON FOR CONSULT: hemodialysis access  HPI: Shawna [name] is a 76 y.o. female in for placement of a new hemodialysis access. She previously had a left brachiocephalic fistula that lasted for about 7 years but recently occluded. She was recently in the hospital with a GI bleed and also had a non-ST MI. This was about one week ago. She states she feels better at this point. She has had no further bleeding. She denies any current chest pain or shortness of breath. Other medical problems include AVMs of the colon, coronary artery disease, elevated cholesterol mitral regurgitation, peripheral arterial disease followed by [place], no significant carotid occlusive disease on recent ultrasound. Her dialysis today is Monday Wednesday Friday.  Past Medical History:  Diagnosis Date  . Anxiety   . Arthritis   . AVM (arteriovenous malformation) of colon   . CAD (coronary artery disease)    a. 12/2011 NSTEMI/Cath/PCI LCX (2.25x14 Resolute DES) & D1 (2.25x22 Resolute DES);  b. 01/2012 Cath/PCI: LM 30, LAD 30p, 40-57m, D1 stent ok, 99 in sm branch of diag, LCX patent stent, OM1 20, RCA 95 ost (4.0x12 Promus DES), EF 55%;  c. 04/2012 Lexi Cardiolite  EF 48%, small area of scar @ base/mid inflat wall with mild peri-infarct ischemia.; CABG 12/4  . Carotid artery disease ([place])    a. 84-66% LICA, 11/9933   . Chronic bronchitis ([place])   . Chronic diastolic CHF (congestive heart failure) ([place])    a. 02/2012 Echo EF 60-65%, nl wall motion, Gr 1 DD, mod MR  . Colon cancer ([place]) 1992  . Esophageal stricture   . ESRD on hemodialysis ([place])    ESRD due to HTN, started dialysis 2011 and gets HD at [facility] with Dr Hinda Lenis on MWF schedule.  Access is LUA AVF as of Sept 2014.   Marland Kitchen GERD (gastroesophageal reflux disease)   . High cholesterol 12/2011  . History of blood transfusion 07/2011; 12/2011; 01/2012 X 2; 04/2012  .  History of gout   . History of lower GI bleeding   . Hypertension   . Iron deficiency anemia   . Mitral regurgitation    a. Moderate by echo, 02/2012  . Myocardial infarction   . Ovarian cancer ([place]) 1992  . Pneumonia ~ 2009  . PUD (peptic ulcer disease)   . TIA (transient ischemic attack)    Past Surgical History:  Procedure Laterality Date  . ABDOMINAL HYSTERECTOMY  1992  . APPENDECTOMY  06/1990  . AV FISTULA PLACEMENT  07/2009   left upper arm  . COLON RESECTION  1992  . CORONARY ANGIOPLASTY WITH STENT PLACEMENT  12/15/11   "2"  . CORONARY ANGIOPLASTY WITH STENT PLACEMENT  y/2013   "1; makes total of 3" (05/02/2012)  . CORONARY ARTERY BYPASS GRAFT  06/13/2012   Procedure: CORONARY ARTERY BYPASS GRAFTING (CABG);  Surgeon: Grace Isaac, MD;  Location: [place];  Service: Open Heart Surgery;  Laterality: N/A;  cabg x four;  using left internal mammary artery, and left leg greater saphenous vein harvested endoscopically  . DILATION AND CURETTAGE OF UTERUS    . ESOPHAGOGASTRODUODENOSCOPY  01/20/2012   Procedure: ESOPHAGOGASTRODUODENOSCOPY (EGD);  Surgeon: Ladene Artist, MD,FACG;  Location: [facility] ENDOSCOPY;  Service: Endoscopy;  Laterality: N/A;  . ESOPHAGOGASTRODUODENOSCOPY N/A 03/26/2013   Procedure: ESOPHAGOGASTRODUODENOSCOPY (EGD);  Surgeon: Irene Shipper, MD;  Location: [facility] ENDOSCOPY;  Service: Endoscopy;  Laterality: N/A;  . ESOPHAGOGASTRODUODENOSCOPY N/A 04/30/2015   Procedure: ESOPHAGOGASTRODUODENOSCOPY (EGD);  Surgeon: Rogene Houston, MD;  [facility];  Service: Endoscopy;  Laterality: N/A;  1pm - moved to 10/20 @ 1:10  . ESOPHAGOGASTRODUODENOSCOPY N/A 07/29/2016   Procedure: ESOPHAGOGASTRODUODENOSCOPY (EGD);  Surgeon: Manus Gunning, MD;  Location: [place];  Service: Gastroenterology;  Laterality: N/A;  enteroscopy  . INTRAOPERATIVE TRANSESOPHAGEAL ECHOCARDIOGRAM  06/13/2012   Procedure: INTRAOPERATIVE TRANSESOPHAGEAL ECHOCARDIOGRAM;  Surgeon: Grace Isaac, MD;  Location: [place];  Service: Open Heart Surgery;  Laterality: N/A;  . IR GENERIC HISTORICAL  07/26/2016   IR FLUORO GUIDE CV LINE RIGHT 07/26/2016 Greggory Keen, MD [facility]  . IR GENERIC HISTORICAL  07/26/2016   IR US GUIDE VASC ACCESS RIGHT 07/26/2016 Greggory Keen, MD [facility]  . IR GENERIC HISTORICAL  08/02/2016   IR US GUIDE VASC ACCESS RIGHT 08/02/2016 Greggory Keen, MD [facility]  . IR GENERIC HISTORICAL  08/02/2016   IR FLUORO GUIDE CV LINE RIGHT 08/02/2016 Greggory Keen, MD [facility]  . LEFT HEART CATHETERIZATION WITH CORONARY ANGIOGRAM N/A 12/15/2011   Procedure: LEFT HEART CATHETERIZATION WITH CORONARY ANGIOGRAM;  Surgeon: Burnell Blanks, MD;  Location: [facility] CATH LAB;  Service: Cardiovascular;  Laterality: N/A;  . LEFT HEART CATHETERIZATION WITH CORONARY ANGIOGRAM N/A 01/10/2012   Procedure: LEFT HEART CATHETERIZATION WITH CORONARY ANGIOGRAM;  Surgeon: Peter M Martinique, MD;  Location: [facility] CATH LAB;  Service: Cardiovascular;  Laterality: N/A;  . LEFT HEART CATHETERIZATION WITH CORONARY ANGIOGRAM N/A 06/08/2012   Procedure: LEFT HEART CATHETERIZATION WITH CORONARY ANGIOGRAM;  Surgeon: Burnell Blanks, MD;  Location: [facility] CATH LAB;  Service: Cardiovascular;  Laterality: N/A;  . LEFT HEART CATHETERIZATION WITH CORONARY/GRAFT ANGIOGRAM N/A 12/10/2013   Procedure: LEFT HEART CATHETERIZATION WITH Beatrix Fetters;  Surgeon: [name], MD;  Location: [facility] CATH LAB;  Service: Cardiovascular;  Laterality: N/A;  . OVARY SURGERY     ovarian cancer  . SHUNTOGRAM N/A 10/15/2013   Procedure: Fistulogram;  Surgeon: Serafina Mitchell, MD;  Location: [facility] CATH LAB;  Service: Cardiovascular;  Laterality: N/A;  . THROMBECTOMY / ARTERIOVENOUS GRAFT REVISION  2011   left upper arm  . TUBAL LIGATION  1980's    Family History  Problem Relation Age of Onset  . Heart disease Mother     Heart Disease before age 38  . Hyperlipidemia Mother   . Hypertension Mother   .  Diabetes Mother   . Heart attack Mother   . Heart disease Father     Heart Disease before age 58  . Hyperlipidemia Father   . Hypertension Father   . Diabetes Father   . Diabetes Sister   . Hypertension Sister   . Diabetes Brother   . Hyperlipidemia Brother   . Heart attack Brother   . Hypertension Sister   . Heart attack Brother   . Other      noncontributory for early CAD  . Colon cancer Neg Hx   . Esophageal cancer Neg Hx   . Liver disease Neg Hx   . Kidney disease Neg Hx   . Colon polyps Neg Hx     SOCIAL HISTORY: Social History   Social History  . Marital status: Married    Spouse name: N/A  . Number of children: N/A  . Years of education: N/A   Occupational History  . Not on file.   Social History Main Topics  . Smoking status: Never Smoker  . Smokeless tobacco:  Never Used  . Alcohol use No  . Drug use: No  . Sexual activity: Yes    Birth control/ protection: Surgical   Other Topics Concern  . Not on file   Social History Narrative   Lives in [place], [place] with husband.  Dialysis pt - [facility].      Current Outpatient Prescriptions  Medication Sig Dispense Refill  . ALPRAZolam (XANAX) 0.25 MG tablet Take 0.25-0.5 mg by mouth at bedtime as needed for sleep.     Marland Kitchen amiodarone (PACERONE) 200 MG tablet Take 1 tablet (200 mg total) by mouth daily. 30 tablet 3  . amLODipine (NORVASC) 10 MG tablet Take 1 tablet (10 mg total) by mouth daily. 30 tablet 0  . aspirin EC 81 MG tablet Take 81 mg by mouth daily.     . cloNIDine (CATAPRES) 0.2 MG tablet Take 1 tablet (0.2 mg total) by mouth 3 (three) times daily. 90 tablet 0  . epoetin alfa (EPOGEN,PROCRIT) 01751 UNIT/ML injection Inject 1 mL (10,000 Units total) into the vein every Monday, Wednesday, and Friday with hemodialysis. 1 mL 10  . fluticasone (FLONASE) 50 MCG/ACT nasal spray Place 2 sprays into the nose daily as needed for allergies.     . hydrALAZINE (APRESOLINE) 50 MG tablet Take 1 tablet (50 mg total) by mouth  every 8 (eight) hours. 90 tablet 0  . isosorbide mononitrate (IMDUR) 30 MG 24 hr tablet Take 3 tablets (90 mg total) by mouth daily. 30 tablet 0  . lanthanum (FOSRENOL) 1000 MG chewable tablet Chew 1 tablet (1,000 mg total) by mouth 3 (three) times daily with meals. 90 tablet 1  . metoprolol tartrate (LOPRESSOR) 25 MG tablet Take 0.5 tablets (12.5 mg total) by mouth 2 (two) times daily. 60 tablet 0  . multivitamin (RENA-VIT) TABS tablet Take 1 tablet by mouth daily.    . nitroGLYCERIN (NITROSTAT) 0.4 MG SL tablet Place 1 tablet (0.4 mg total) under the tongue every 5 (five) minutes as needed for chest pain. 30 tablet 1  . pantoprazole (PROTONIX) 40 MG tablet Take 1 tablet (40 mg total) by mouth daily. 30 tablet 1  . PROAIR HFA 108 (90 BASE) MCG/ACT inhaler Inhale 1 puff into the lungs every 6 (six) hours as needed for wheezing or shortness of breath.     . SENSIPAR 30 MG tablet Take 30 mg by mouth daily with supper.     . simvastatin (ZOCOR) 20 MG tablet Take 1 tablet (20 mg total) by mouth at bedtime. 90 tablet 3   No current facility-administered medications for this visit.     ROS:   General:  No weight loss, Fever, chills  HEENT: No recent headaches, no nasal bleeding, no visual changes, no sore throat  Neurologic: No dizziness, blackouts, seizures. No recent symptoms of stroke or mini- stroke. No recent episodes of slurred speech, or temporary blindness.  Cardiac: No recent episodes of chest pain/pressure, no shortness of breath at rest.  No shortness of breath with exertion.  Denies history of atrial fibrillation or irregular heartbeat  Vascular: No history of rest pain in feet.  + history of claudication.  No history of non-healing ulcer, No history of DVT   Pulmonary: No home oxygen, no productive cough, no hemoptysis,  No asthma or wheezing  Musculoskeletal:  [ ]  Arthritis, [ ]  Low back pain,  [ ]  Joint pain  Hematologic:No history of hypercoagulable state.  No history of easy  bleeding.  No history of anemia  Gastrointestinal: No  hematochezia or melena,  No gastroesophageal reflux, no trouble swallowing  Urinary: [X]  chronic Kidney disease, [X]  on HD - [X]  MWF or [ ]  TTHS, [ ]  Burning with urination, [ ]  Frequent urination, [ ]  Difficulty urinating;   Skin: No rashes  Psychological: No history of anxiety,  No history of depression   Physical Examination  Vitals:   08/11/16 1346  BP: (!) 147/68  Pulse: 70  Resp: 20  Temp: 98.3 F (36.8 C)  TempSrc: Oral  SpO2: 95%  Weight: 125 lb (56.7 kg)  Height: 5\' 2"  (1.575 m)    Body mass index is 22.86 kg/m.  General:  Alert and oriented, no acute distress HEENT: Normal Neck: No bruit JVD Pulmonary: Clear to auscultation bilaterally Cardiac: Regular Rate and Rhythm  Abdomen: Soft, non-tender, non-distended, no mass Skin: No rash Extremity Pulses:  2+ radial, brachial Right side, occluded left upper arm AV fistula Musculoskeletal: No deformity or edema  Neurologic: Upper and lower extremity motor 5/5 and symmetric  DATA:  Recent vein mapping was reviewed. This shows a reasonable cephalic vein in the right upper arm 2-3 mm in diameter. The basilic vein is millimeters in diameter in this arm as well.  ASSESSMENT:  Discussed with the patient today the possibility of a left upper arm AV graft with less durability versus a new right arm fistula which will probably be more durable and last longer. Risks benefits possible complications of right arm AV fistula were discussed with the patient today including but not limited to bleeding infection ischemic steal non-maturation. She understands and agrees to proceed. Since she had a myocardial infarction about a week ago we will wait until the end of February for placement of her fistula.   PLAN:  Right upper extremity AV fistula brachiocephalic planned for 80/22/3361.   Ruta Hinds, MD [facility] Office: [phone] Pager:  [phone]

## 2016-09-06 NOTE — Transfer of Care (Signed)
Immediate Anesthesia Transfer of Care Note  Patient: Shawna [name]  Procedure(s) Performed: Procedure(s): REMOVAL OF Right Arm ARTERIOVENOUS GORETEX GRAFT and Vein Patch angioplasty of brachial artery (Right)  Patient Location: PACU  Anesthesia Type:MAC  Level of Consciousness: awake, alert , oriented and patient cooperative  Airway & Oxygen Therapy: Patient Spontanous Breathing and Patient connected to face mask oxygen  Post-op Assessment: Report given to RN and Post -op Vital signs reviewed and stable  Post vital signs: Reviewed and stable  Last Vitals:  Vitals:   09/06/16 1333 09/06/16 1335  BP:  (!) 194/74  Pulse:  (!) 59  Resp: 20 18  Temp: 36.3 C     Last Pain:  Vitals:   09/06/16 1022  TempSrc: Oral      Patients Stated Pain Goal: 0 (48/62/82 4175)  Complications: No apparent anesthesia complications

## 2016-09-06 NOTE — Transfer of Care (Signed)
Immediate Anesthesia Transfer of Care Note  Patient: Shawna [name]  Procedure(s) Performed: Procedure(s): RIGHT FOREARM ARTERIOVENOUS (AV) GRAFT (Right)  Patient Location: PACU  Anesthesia Type:MAC  Level of Consciousness: awake, alert , oriented and patient cooperative  Airway & Oxygen Therapy: Patient Spontanous Breathing and Patient connected to nasal cannula oxygen  Post-op Assessment: Report given to RN, Post -op Vital signs reviewed and stable and Patient moving all extremities X 4  Post vital signs: Reviewed and stable  Last Vitals:  Vitals:   09/06/16 0610  BP: (!) 163/54  Pulse: (!) 56  Resp: 18  Temp: 36.6 C    Last Pain:  Vitals:   09/06/16 0610  TempSrc: Oral      Patients Stated Pain Goal: 0 (89/78/47 8412)  Complications: No apparent anesthesia complications

## 2016-09-07 ENCOUNTER — Encounter (HOSPITAL_COMMUNITY): Payer: Self-pay

## 2016-09-07 DIAGNOSIS — R079 Chest pain, unspecified: Secondary | ICD-10-CM

## 2016-09-07 DIAGNOSIS — I5032 Chronic diastolic (congestive) heart failure: Secondary | ICD-10-CM | POA: Diagnosis not present

## 2016-09-07 DIAGNOSIS — I248 Other forms of acute ischemic heart disease: Secondary | ICD-10-CM

## 2016-09-07 DIAGNOSIS — I25119 Atherosclerotic heart disease of native coronary artery with unspecified angina pectoris: Secondary | ICD-10-CM | POA: Diagnosis not present

## 2016-09-07 DIAGNOSIS — D631 Anemia in chronic kidney disease: Secondary | ICD-10-CM | POA: Diagnosis not present

## 2016-09-07 DIAGNOSIS — I161 Hypertensive emergency: Secondary | ICD-10-CM

## 2016-09-07 DIAGNOSIS — I10 Essential (primary) hypertension: Secondary | ICD-10-CM | POA: Diagnosis not present

## 2016-09-07 DIAGNOSIS — R0789 Other chest pain: Secondary | ICD-10-CM

## 2016-09-07 DIAGNOSIS — N185 Chronic kidney disease, stage 5: Secondary | ICD-10-CM | POA: Diagnosis not present

## 2016-09-07 DIAGNOSIS — Z992 Dependence on renal dialysis: Secondary | ICD-10-CM

## 2016-09-07 DIAGNOSIS — N186 End stage renal disease: Secondary | ICD-10-CM

## 2016-09-07 LAB — DIFFERENTIAL
Basophils Absolute: 0.1 10*3/uL (ref 0.0–0.1)
Basophils Relative: 1 %
Eosinophils Absolute: 0.5 10*3/uL (ref 0.0–0.7)
Eosinophils Relative: 4 %
LYMPHS ABS: 1 10*3/uL (ref 0.7–4.0)
Lymphocytes Relative: 9 %
MONO ABS: 0.9 10*3/uL (ref 0.1–1.0)
Monocytes Relative: 8 %
Neutro Abs: 8.6 10*3/uL — ABNORMAL HIGH (ref 1.7–7.7)
Neutrophils Relative %: 78 %

## 2016-09-07 LAB — APTT: aPTT: 31 seconds (ref 24–36)

## 2016-09-07 LAB — TROPONIN I
TROPONIN I: 0.14 ng/mL — AB (ref <0.03)
Troponin I: 0.03 ng/mL (ref <0.03)
Troponin I: 0.07 ng/mL (ref <0.03)
Troponin I: 0.21 ng/mL (ref <0.03)

## 2016-09-07 LAB — COMPREHENSIVE METABOLIC PANEL
ALBUMIN: 3.8 g/dL (ref 3.5–5.0)
ALK PHOS: 116 U/L (ref 38–126)
ALT: 9 U/L — ABNORMAL LOW (ref 14–54)
AST: 16 U/L (ref 15–41)
Anion gap: 17 — ABNORMAL HIGH (ref 5–15)
BILIRUBIN TOTAL: 0.6 mg/dL (ref 0.3–1.2)
BUN: 62 mg/dL — AB (ref 6–20)
CO2: 21 mmol/L — AB (ref 22–32)
Calcium: 8.2 mg/dL — ABNORMAL LOW (ref 8.9–10.3)
Chloride: 91 mmol/L — ABNORMAL LOW (ref 101–111)
Creatinine, Ser: 9.48 mg/dL — ABNORMAL HIGH (ref 0.44–1.00)
GFR calc Af Amer: 4 mL/min — ABNORMAL LOW (ref >60)
GFR calc non Af Amer: 4 mL/min — ABNORMAL LOW (ref >60)
GLUCOSE: 156 mg/dL — AB (ref 65–99)
POTASSIUM: 5 mmol/L (ref 3.5–5.1)
SODIUM: 129 mmol/L — AB (ref 135–145)
Total Protein: 7.5 g/dL (ref 6.5–8.1)

## 2016-09-07 LAB — CBC
HEMATOCRIT: 40.7 % (ref 36.0–46.0)
HEMOGLOBIN: 13.3 g/dL (ref 12.0–15.0)
MCH: 31.4 pg (ref 26.0–34.0)
MCHC: 32.7 g/dL (ref 30.0–36.0)
MCV: 96 fL (ref 78.0–100.0)
Platelets: 320 10*3/uL (ref 150–400)
RBC: 4.24 MIL/uL (ref 3.87–5.11)
RDW: 17.2 % — ABNORMAL HIGH (ref 11.5–15.5)
WBC: 11 10*3/uL — ABNORMAL HIGH (ref 4.0–10.5)

## 2016-09-07 LAB — PROTIME-INR
INR: 0.96
Prothrombin Time: 12.8 seconds (ref 11.4–15.2)

## 2016-09-07 LAB — POC OCCULT BLOOD, ED: FECAL OCCULT BLD: NEGATIVE

## 2016-09-07 IMAGING — DX DG CHEST 2V
2 series · 2 of 2 positions shown · non-contrast
Comparison: Chest radiograph performed [DATE]

CLINICAL DATA: Acute onset of central chest pain, radiating to the
back. Initial encounter.

EXAM:
CHEST  2 VIEW

[chest lat]
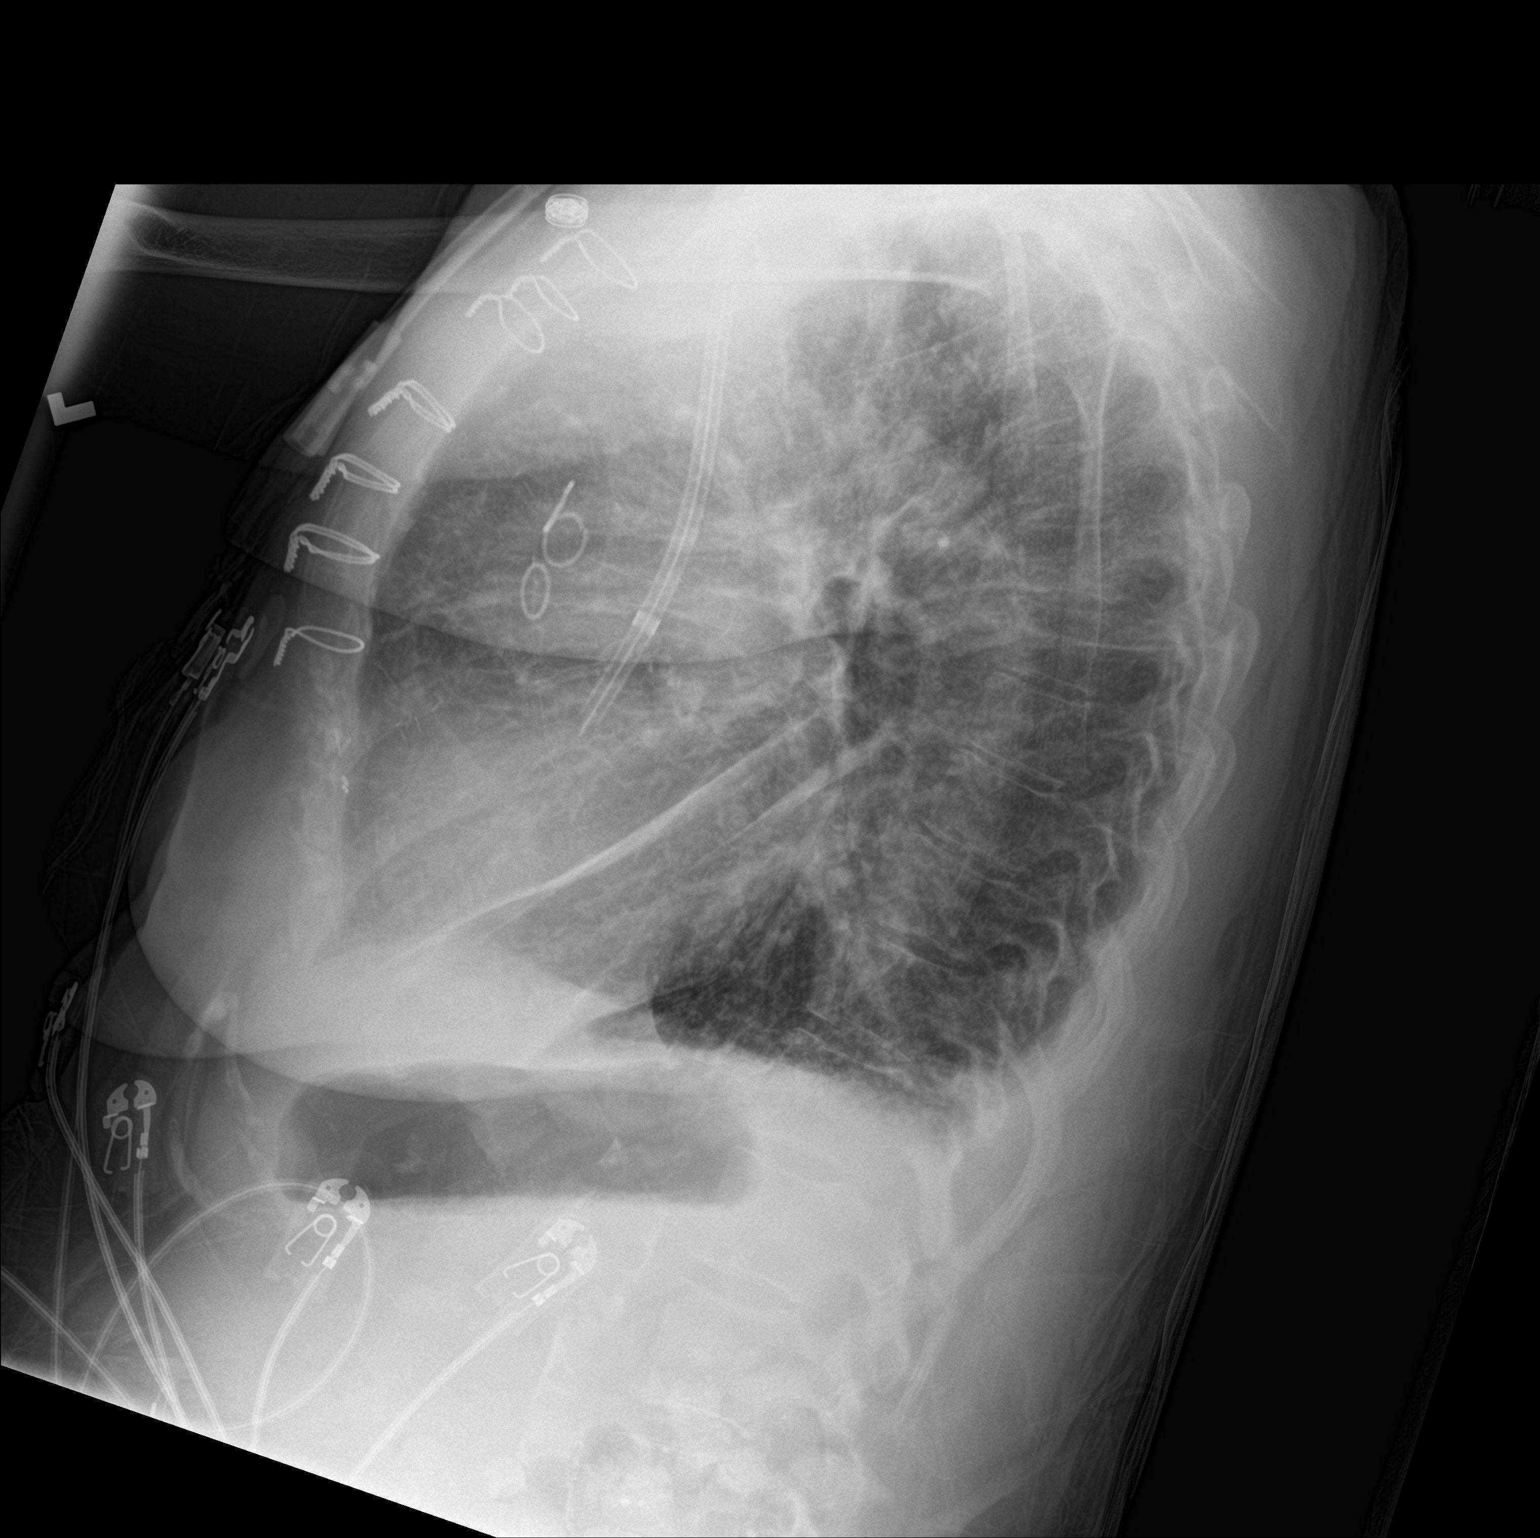

[chest ap]
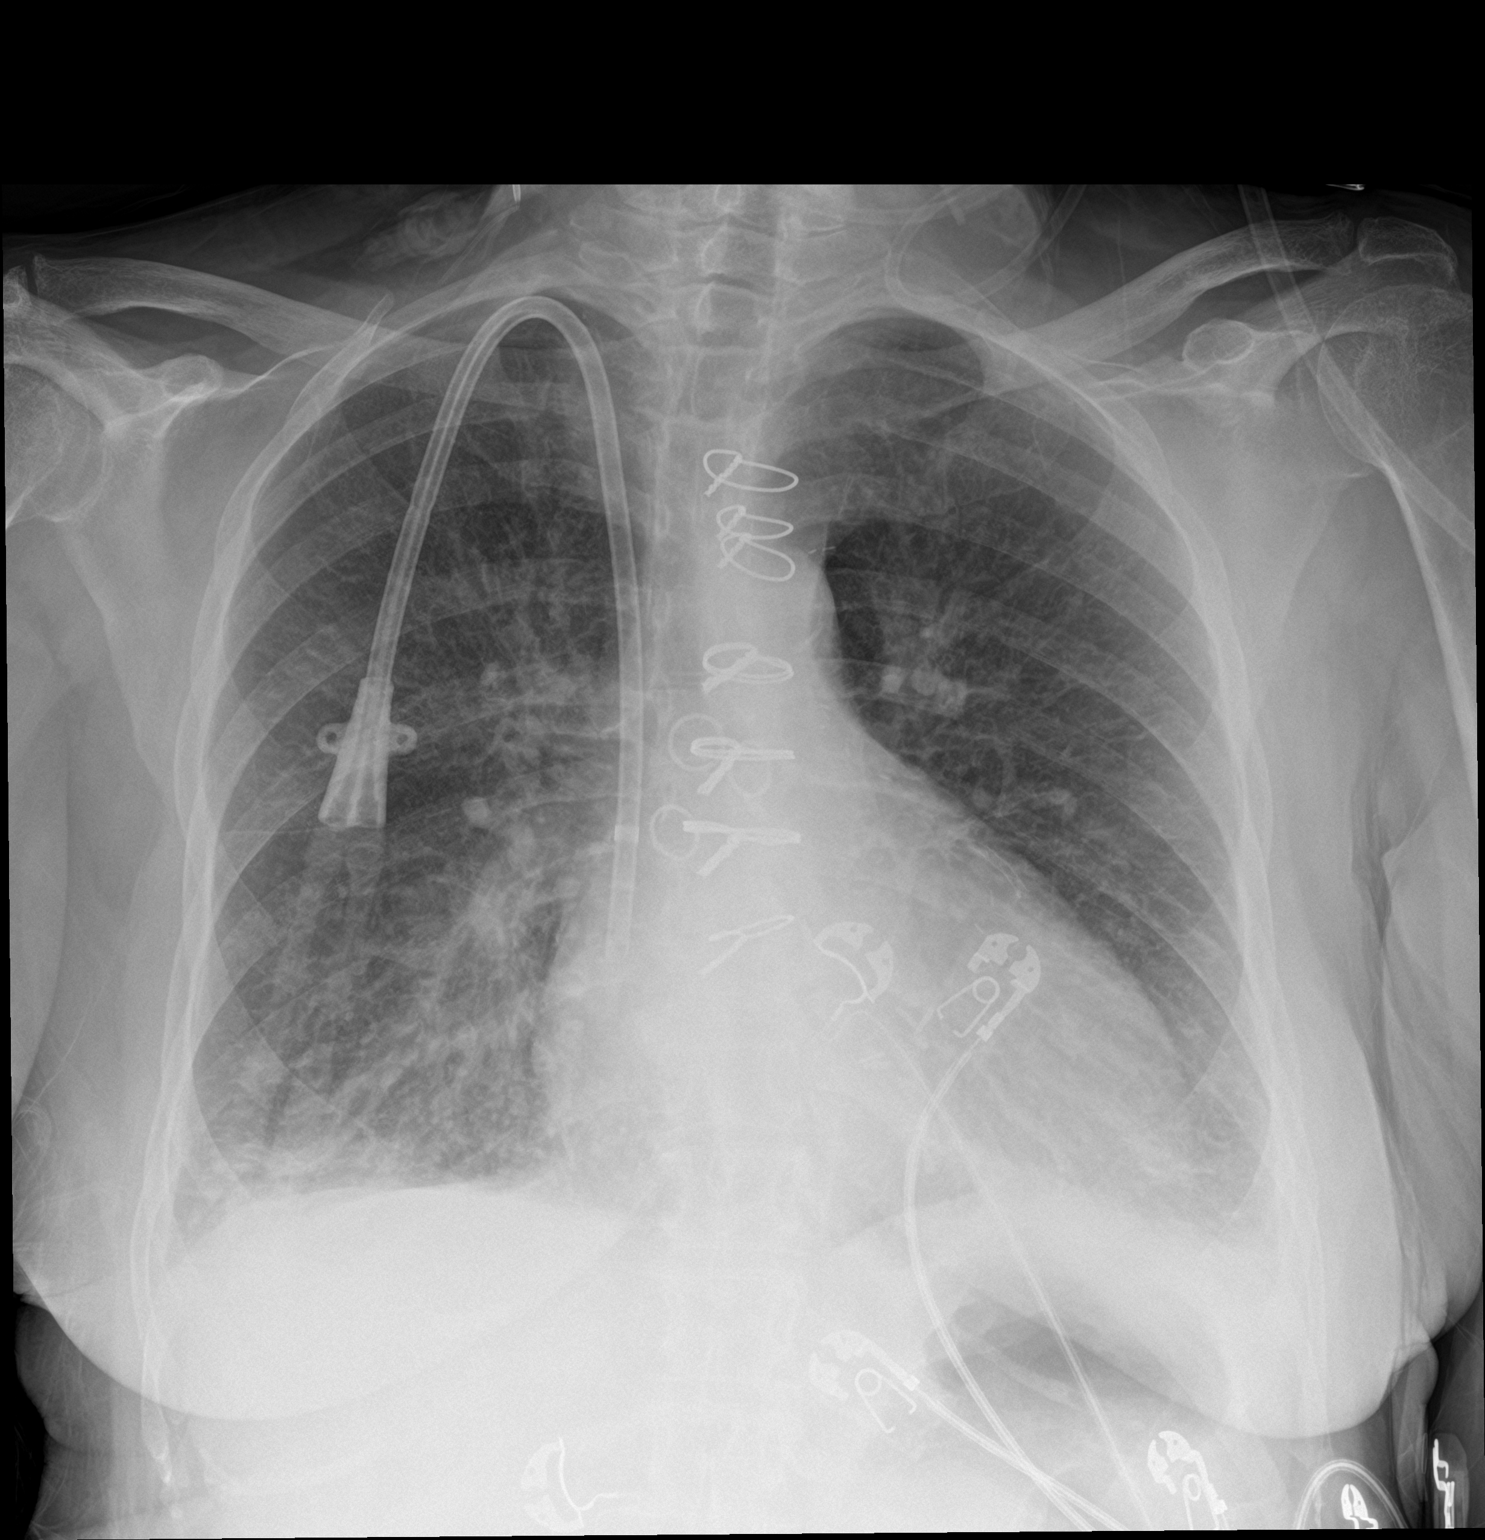

[2 of 2 positions shown; findings below may reference images not displayed]

FINDINGS: The lungs are well-aerated. Vascular congestion is noted. Bibasilar
airspace opacities may reflect mild interstitial edema. Small
bilateral pleural effusions are seen. No pneumothorax is seen.

The heart is borderline enlarged. The patient is status post median
sternotomy, with evidence of prior CABG. A right-sided dual-lumen
catheter is noted ending about the cavoatrial junction. No acute
osseous abnormalities are seen.

Scattered calcification is seen along the abdominal aorta.
IMPRESSION: 1. Vascular congestion and borderline cardiomegaly. Bibasilar
airspace opacities may reflect mild interstitial edema. Small
bilateral pleural effusions seen.
2. Scattered aortic atherosclerosis.

## 2016-09-07 MED ORDER — ACETAMINOPHEN 325 MG PO TABS: 650.0000 mg | ORAL_TABLET | ORAL | Status: DC | PRN

## 2016-09-07 MED ORDER — ALBUTEROL SULFATE (2.5 MG/3ML) 0.083% IN NEBU: 3.0000 mL | INHALATION_SOLUTION | Freq: Four times a day (QID) | RESPIRATORY_TRACT | Status: DC | PRN

## 2016-09-07 MED ORDER — LIDOCAINE HCL (PF) 1 % IJ SOLN: 5.0000 mL | INTRAMUSCULAR | Status: DC | PRN

## 2016-09-07 MED ORDER — CLONIDINE HCL 0.2 MG PO TABS
0.2000 mg | ORAL_TABLET | Freq: Three times a day (TID) | ORAL | Status: DC
  Administered 2016-09-07 – 2016-09-08 (×3): 0.2 mg via ORAL
  Filled 2016-09-07 (×3): qty 1

## 2016-09-07 MED ORDER — LIDOCAINE-PRILOCAINE 2.5-2.5 % EX CREA
1.0000 | TOPICAL_CREAM | CUTANEOUS | Status: DC | PRN
Start: 2016-09-07 — End: 2016-09-08

## 2016-09-07 MED ORDER — IPRATROPIUM-ALBUTEROL 0.5-2.5 (3) MG/3ML IN SOLN
3.0000 mL | Freq: Once | RESPIRATORY_TRACT | Status: AC
  Administered 2016-09-07: 3 mL via RESPIRATORY_TRACT
  Filled 2016-09-07: qty 3

## 2016-09-07 MED ORDER — PENTAFLUOROPROP-TETRAFLUOROETH EX AERO: 1.0000 "application " | INHALATION_SPRAY | CUTANEOUS | Status: DC | PRN

## 2016-09-07 MED ORDER — LANTHANUM CARBONATE 500 MG PO CHEW
1000.0000 mg | CHEWABLE_TABLET | Freq: Three times a day (TID) | ORAL | Status: DC
  Administered 2016-09-07 – 2016-09-08 (×3): 1000 mg via ORAL
  Filled 2016-09-07 (×7): qty 2

## 2016-09-07 MED ORDER — FLUTICASONE PROPIONATE 50 MCG/ACT NA SUSP
NASAL | Status: AC
  Filled 2016-09-07: qty 16

## 2016-09-07 MED ORDER — AMLODIPINE BESYLATE 5 MG PO TABS: 10.0000 mg | ORAL_TABLET | Freq: Every day | ORAL | Status: DC

## 2016-09-07 MED ORDER — AMIODARONE HCL 200 MG PO TABS
200.0000 mg | ORAL_TABLET | Freq: Every day | ORAL | Status: DC
  Administered 2016-09-08: 200 mg via ORAL
  Filled 2016-09-07: qty 1

## 2016-09-07 MED ORDER — ISOSORBIDE MONONITRATE ER 60 MG PO TB24
90.0000 mg | ORAL_TABLET | Freq: Every day | ORAL | Status: DC
  Administered 2016-09-08: 90 mg via ORAL
  Filled 2016-09-07: qty 1

## 2016-09-07 MED ORDER — HYDRALAZINE HCL 25 MG PO TABS: 50.0000 mg | ORAL_TABLET | Freq: Three times a day (TID) | ORAL | Status: DC

## 2016-09-07 MED ORDER — ASPIRIN EC 81 MG PO TBEC
81.0000 mg | DELAYED_RELEASE_TABLET | Freq: Every day | ORAL | Status: DC
  Administered 2016-09-08: 81 mg via ORAL
  Filled 2016-09-07: qty 1

## 2016-09-07 MED ORDER — SODIUM CHLORIDE 0.9 % IV SOLN: 100.0000 mL | INTRAVENOUS | Status: DC | PRN

## 2016-09-07 MED ORDER — HEPARIN SODIUM (PORCINE) 1000 UNIT/ML DIALYSIS
1000.0000 [IU] | INTRAMUSCULAR | Status: DC | PRN
  Administered 2016-09-07: 1000 [IU] via INTRAVENOUS_CENTRAL
  Filled 2016-09-07 (×2): qty 1

## 2016-09-07 MED ORDER — ALTEPLASE 2 MG IJ SOLR
2.0000 mg | Freq: Once | INTRAMUSCULAR | Status: DC | PRN
  Filled 2016-09-07: qty 2

## 2016-09-07 MED ORDER — FLUTICASONE PROPIONATE 50 MCG/ACT NA SUSP
1.0000 | Freq: Every evening | NASAL | Status: DC | PRN
Start: 2016-09-07 — End: 2016-09-08
  Administered 2016-09-08: 1 via NASAL

## 2016-09-07 MED ORDER — SIMVASTATIN 20 MG PO TABS
20.0000 mg | ORAL_TABLET | Freq: Every day | ORAL | Status: DC
  Administered 2016-09-07: 20 mg via ORAL
  Filled 2016-09-07: qty 1

## 2016-09-07 MED ORDER — ONDANSETRON HCL 4 MG/2ML IJ SOLN: 4.0000 mg | Freq: Four times a day (QID) | INTRAMUSCULAR | Status: DC | PRN

## 2016-09-07 NOTE — Progress Notes (Signed)
09/06/2016 11:20 PM  09/07/2016 11:58 AM  Shawna [name] [name] was seen and examined.  The H&P by the admitting provider , orders, imaging was reviewed.  Please see orders.  Will continue to follow.   [name], MD [facility]

## 2016-09-07 NOTE — Care Management Obs Status (Signed)
[place] NOTIFICATION   Patient Details  Name: Shawna [name] MRN: [id number] Date of Birth: [birth year]   Medicare Observation Status Notification Given:  Yes    Sherald Barge, RN 09/07/2016, 10:06 AM

## 2016-09-07 NOTE — Progress Notes (Signed)
Morning medications held until pt. Comes back from dialysis later. Will continue to monitor.

## 2016-09-07 NOTE — Progress Notes (Signed)
Pt requesting Nasal spray or Claritin, MD on call Dr. Baltazar Najjar paged and made aware.

## 2016-09-07 NOTE — Progress Notes (Signed)
Pt back from Dialysis. Medications given. Call bell within reach. Will continue to monitor.

## 2016-09-07 NOTE — Anesthesia Postprocedure Evaluation (Addendum)
Anesthesia Post Note  Patient: Shawna [name]  Procedure(s) Performed: Procedure(s) (LRB): REMOVAL OF Right Arm ARTERIOVENOUS GORETEX GRAFT and Vein Patch angioplasty of brachial artery (Right)  Patient location during evaluation: PACU Anesthesia Type: MAC Level of consciousness: awake and alert Pain management: pain level controlled Vital Signs Assessment: post-procedure vital signs reviewed and stable Respiratory status: spontaneous breathing, nonlabored ventilation, respiratory function stable and patient connected to nasal cannula oxygen Cardiovascular status: stable and blood pressure returned to baseline Anesthetic complications: no       Last Vitals:  Vitals:   09/06/16 1440 09/06/16 1441  BP: (!) 165/40 (!) 159/72  Pulse: 63 65  Resp: 19 15  Temp: 36.1 C     Last Pain:  Vitals:   09/06/16 1357  TempSrc:   PainSc: 5                  Decker Cogdell,JAMES TERRILL

## 2016-09-07 NOTE — Consult Note (Signed)
DEBORA STOCKDALE MRN: [id number] DOB/AGE: 02/20/1941 76 y.o. Primary Care Physician:HOWARD, Lennette Bihari, MD Admit date: 09/06/2016 Chief Complaint:  Chief Complaint  Patient presents with  . Chest Pain   HPI: Pt is 76 year old female withg past medical hx of ESRD who was presented to Er with c/o chest pain.  HPI dates back to yesterday when pt presented to ER with sudden onset of chest pain,relived by NTG. Pt has had eventful last few weeks Pt was admitted with NSTEMI and HTN emergency in January. Pt later had her AVF clotted and underwent AVG placement on 09/06/16. Pt then developed Steal syndrome and AVG was removed on 09/06/16 Pt was dced home and came to ER thereafter with chest pain. Pt seen on 3rd floor today. Pt says " I feel good"  No c/o fever/cough/chiils NO c/o nausea/vomiting NO c/o hematuria No c/o syncope No c/o recent travel. NO c/o blood in stools No c/o hematuria NO c/o black tarry stools. Pt says " I am ready to go for dialysis"  Pt husband in present in the room and voices no new concerns besides asking " when will she go for her tx"    Past medical hx  Reviewed ESRD HTN CAD Past Medical History:  Diagnosis Date  . Anxiety   . Arthritis   . AVM (arteriovenous malformation) of colon   . CAD (coronary artery disease)    a. 12/2011 NSTEMI/Cath/PCI LCX (2.25x14 Resolute DES) & D1 (2.25x22 Resolute DES);  b. 01/2012 Cath/PCI: LM 30, LAD 30p, 40-38m, D1 stent ok, 99 in sm branch of diag, LCX patent stent, OM1 20, RCA 95 ost (4.0x12 Promus DES), EF 55%;  c. 04/2012 Lexi Cardiolite  EF 48%, small area of scar @ base/mid inflat wall with mild peri-infarct ischemia.; CABG 12/4  . Carotid artery disease ([place])    a. 78-46% LICA, 03/6294   . Chronic bronchitis ([place])   . Chronic diastolic CHF (congestive heart failure) ([place])    a. 02/2012 Echo EF 60-65%, nl wall motion, Gr 1 DD, mod MR  . Colon cancer ([place]) 1992  . Esophageal stricture   . ESRD on hemodialysis ([place])    ESRD due  to HTN, started dialysis 2011 and gets HD at [facility] with Dr Hinda Lenis on MWF schedule.  Access is LUA AVF as of Sept 2014.   Marland Kitchen GERD (gastroesophageal reflux disease)   . High cholesterol 12/2011  . History of blood transfusion 07/2011; 12/2011; 01/2012 X 2; 04/2012  . History of gout   . History of lower GI bleeding   . Hypertension   . Iron deficiency anemia   . Mitral regurgitation    a. Moderate by echo, 02/2012  . Myocardial infarction   . Ovarian cancer ([place]) 1992  . Pneumonia ~ 2009  . PUD (peptic ulcer disease)   . TIA (transient ischemic attack)       Family hx Reviewed NO hx of ESRD    Social History: denies smoking/drinking/illegal drug usage Lives with her husband.   Allergies:  reviewed       MWU:XLKGM from the symptoms mentioned above,there are no other symptoms referable to all systems reviewed.  Marland Kitchen amiodarone  200 mg Oral Daily  . amLODipine  10 mg Oral Daily  . aspirin EC  81 mg Oral Daily  . cloNIDine  0.2 mg Oral TID  . hydrALAZINE  50 mg Oral Q8H  . isosorbide mononitrate  90 mg Oral Daily  . lanthanum  1,000 mg Oral  TID WC  . simvastatin  20 mg Oral QHS      Physical Exam: Vital signs in last 24 hours: Temp:  [97 F (36.1 C)-98 F (36.7 C)] 97.9 F (36.6 C) (02/28 0340) Pulse Rate:  [52-86] 69 (02/28 0340) Resp:  [13-20] 20 (02/28 0230) BP: (142-218)/(40-103) 142/51 (02/28 0900) SpO2:  [96 %-100 %] 100 % (02/28 0340) Weight:  [130 lb 15.3 oz (59.4 kg)-131 lb (59.4 kg)] 130 lb 15.3 oz (59.4 kg) (02/28 0340) Weight change:  Last BM Date: 09/06/16  Intake/Output from previous day: No intake/output data recorded. No intake/output data recorded.   Physical Exam: General- pt is awake,alert, oriented to time place and person Resp- No acute REsp distress, rhonchi  at bases CVS- S1S2 regular in rate and rhythm GIT- BS+, soft, NT, ND EXT- NO LE Edema, NO Cyanosis CNS- CN 2-12 grossly intact. Moving all 4 extremities Psych- normal  mood and affect Access-  PC.  Lab Results: CBC  Recent Labs  09/06/16 0645 09/07/16 0135  WBC  --  11.0*  HGB 11.2* 13.3  HCT 33.0* 40.7  PLT  --  320    BMET  Recent Labs  09/06/16 0645 09/07/16 0135  NA 130* 129*  K 4.9 5.0  CL  --  91*  CO2  --  21*  GLUCOSE 110* 156*  BUN  --  62*  CREATININE  --  9.48*  CALCIUM  --  8.2*      Lab Results  Component Value Date   PTH 709.7 (H) 05/29/2013   CALCIUM 8.2 (L) 09/07/2016   CAION 1.06 (L) 07/26/2016   PHOS 5.9 (H) 07/28/2016     Albumin=3.8 Corrected calcium 8.4  CXR 09/07/16 1. Vascular congestion and borderline cardiomegaly. Bibasilar airspace opacities may reflect mild interstitial edema. Small bilateral pleural effusions seen. 2. Scattered aortic atherosclerosis.    Impression: 1)Renal ESRd on HD On MWF schedule Pt will  dialyzed be today  2)HTN  BP  at goal   Medication- On vasodilators On Central Acting Sympatholytics-  3)Anemia in ESRD the goal fpr  HGb is  9--11. Pt HGb  at goal Will keep on epo during hd   4)CKD Mineral-Bone Disorder Secondary Hyperparathyroidism present    On Cinacalcet Phosphorus not at goal.     On binders Calcium is just below  Goal      HD should help  5)CVS-Admitted with chest pain  Primary MD following  6)Electrolytes  Normokalemic Hyponatremic   7)Acid base Co2 at goal     Plan:  Will use 2 k/2.5 cal  bath NO need of epo as hgb more than 12    Zion Ta S 09/07/2016, 9:38 AM

## 2016-09-07 NOTE — Progress Notes (Signed)
CRITICAL VALUE ALERT  Critical value received:  Troponin 0.14  Date of notification:  09/07/16  Time of notification:  0715  Critical value read back:Yes.    Nurse who received alert:  Marlowe Shores, RN  MD notified (1st page):  Dr. Wynetta Emery  Time of first page:  0728  MD notified (2nd page):  Time of second page:  Responding MD: Dr. Wynetta Emery  Time MD responded:  0800

## 2016-09-07 NOTE — ED Notes (Signed)
CRITICAL VALUE ALERT  Critical value received:  Troponin 0.03  Date of notification:  09/07/16  Time of notification:  0152  Critical value read back: yes   Nurse who received alert:  Legrand Como, RN  MD notified (1st page):  Dr. Wyvonnia Dusky  Time of first page:  [phone]

## 2016-09-07 NOTE — Progress Notes (Signed)
Pt stated that she takes Fosrenal 1,000 mg tablets at home after each meal. MD paged and will continue to monitor and give if order comes through.

## 2016-09-07 NOTE — Consult Note (Signed)
Cardiology Consultation   Patient ID: Shawna [name]; [id number]; [birth year]   Admit date: 09/06/2016 Date of Consult: 09/07/2016  Referring MD:  Dr. Wynetta [name] Cardiologist: Dr. Bronson [name] Consulting Cardiologist: Dr. Domenic [name]  Patient Care Team: Shawna Percy, MD as PCP - General (Family Medicine) Shawna Lowes, MD as Attending Physician (Nephrology) Shawna Commons, MD as Attending Physician (Cardiology)    Reason for Consultation: Chest pain   History of Present Illness: Shawna [name] is a medically complex 76 y.o. female with a hx of CAD s/p CABG in 2013. She presented on 07/27/16 with chest pain and hypertensive urgency. Also with ESRD on HD. Troponins 1.52, 2.59, 2.08 in setting of hypertesive urgency and ESRD felt to be demand ischemia. EKG without acute change. She also had a GI bleed with Hbg down to 7.4, contrast dye allergy and no IV access except dialysis catheter. Myoview shows a fixed ant defect EF 41% concerning for infarct but results are discordant with the echo that shows normal LV function. Med therapy was elected because would not be able to proceed with PCI/stent with GIB and cautery of AVM's.   Yesterday she had removal of AV graft b/c of steal syndrome. She went home and developed chest pain described as a tightness into her shoulder blades. Her BP was 243/101. She took 3 NTG and 2 ASA and came to ER where pain eased. When she got to ER BP was 180/84. Troponins 0.03, 0.07, 0.14. Hbg 13.3 Na 129   Last cardiac catheterization performed on 12/10/2013 showed a patent left main, focal disease in the mid to distal LAD, patent diagonal, patent LIMA to LAD, severe stenosis in proximal left circumflex, patent SVG to left circumflex, 70% proximal ramus stenosis, patent SVG to ramus, occluded stent to ostium of RCA and finely patent SVG to distal RCA. She had a normal Myoview on 08/25/2014.   Past Medical History:  Diagnosis Date  . Anxiety   . Arthritis   . AVM  (arteriovenous malformation) of colon   . CAD (coronary artery disease)    a. 12/2011 NSTEMI/Cath/PCI LCX (2.25x14 Resolute DES) & D1 (2.25x22 Resolute DES);  b. 01/2012 Cath/PCI: LM 30, LAD 30p, 40-64m, D1 stent ok, 99 in sm branch of diag, LCX patent stent, OM1 20, RCA 95 ost (4.0x12 Promus DES), EF 55%;  c. 04/2012 Lexi Cardiolite  EF 48%, small area of scar @ base/mid inflat wall with mild peri-infarct ischemia.; CABG 12/4  . Carotid artery disease ([place])    a. 78-29% LICA, 11/6211   . Chronic bronchitis ([place])   . Chronic diastolic CHF (congestive heart failure) ([place])    a. 02/2012 Echo EF 60-65%, nl wall motion, Gr 1 DD, mod MR  . Colon cancer ([place]) 1992  . Esophageal stricture   . ESRD on hemodialysis ([place])    ESRD due to HTN, started dialysis 2011 and gets HD at [facility] with Dr Hinda Lenis on MWF schedule.  Access is LUA AVF as of Sept 2014.   Marland Kitchen GERD (gastroesophageal reflux disease)   . High cholesterol 12/2011  . History of blood transfusion 07/2011; 12/2011; 01/2012 X 2; 04/2012  . History of gout   . History of lower GI bleeding   . Hypertension   . Iron deficiency anemia   . Mitral regurgitation    a. Moderate by echo, 02/2012  . Myocardial infarction   . Ovarian cancer ([place]) 1992  . Pneumonia ~ 2009  . PUD (peptic ulcer disease)   . TIA (transient  ischemic attack)     Past Surgical History:  Procedure Laterality Date  . ABDOMINAL HYSTERECTOMY  1992  . APPENDECTOMY  06/1990  . AV FISTULA PLACEMENT  07/2009   left upper arm  . COLON RESECTION  1992  . CORONARY ANGIOPLASTY WITH STENT PLACEMENT  12/15/11   "2"  . CORONARY ANGIOPLASTY WITH STENT PLACEMENT  y/2013   "1; makes total of 3" (05/02/2012)  . CORONARY ARTERY BYPASS GRAFT  06/13/2012   Procedure: CORONARY ARTERY BYPASS GRAFTING (CABG);  Surgeon: Shawna Isaac, MD;  Location: [place];  Service: Open Heart Surgery;  Laterality: N/A;  cabg x four;  using left internal mammary artery, and left leg greater saphenous vein  harvested endoscopically  . DILATION AND CURETTAGE OF UTERUS    . ESOPHAGOGASTRODUODENOSCOPY  01/20/2012   Procedure: ESOPHAGOGASTRODUODENOSCOPY (EGD);  Surgeon: Shawna Artist, MD,FACG;  Location: [facility] ENDOSCOPY;  Service: Endoscopy;  Laterality: N/A;  . ESOPHAGOGASTRODUODENOSCOPY N/A 03/26/2013   Procedure: ESOPHAGOGASTRODUODENOSCOPY (EGD);  Surgeon: Shawna Shipper, MD;  Location: [facility] ENDOSCOPY;  Service: Endoscopy;  Laterality: N/A;  . ESOPHAGOGASTRODUODENOSCOPY N/A 04/30/2015   Procedure: ESOPHAGOGASTRODUODENOSCOPY (EGD);  Surgeon: Shawna Houston, MD;  [facility];  Service: Endoscopy;  Laterality: N/A;  1pm - moved to 10/20 @ 1:10  . ESOPHAGOGASTRODUODENOSCOPY N/A 07/29/2016   Procedure: ESOPHAGOGASTRODUODENOSCOPY (EGD);  Surgeon: Shawna Gunning, MD;  Location: [place];  Service: Gastroenterology;  Laterality: N/A;  enteroscopy  . INTRAOPERATIVE TRANSESOPHAGEAL ECHOCARDIOGRAM  06/13/2012   Procedure: INTRAOPERATIVE TRANSESOPHAGEAL ECHOCARDIOGRAM;  Surgeon: Shawna Isaac, MD;  Location: [place];  Service: Open Heart Surgery;  Laterality: N/A;  . IR GENERIC HISTORICAL  07/26/2016   IR FLUORO GUIDE CV LINE RIGHT 07/26/2016 Shawna Keen, MD [facility]  . IR GENERIC HISTORICAL  07/26/2016   IR US GUIDE VASC ACCESS RIGHT 07/26/2016 Shawna Keen, MD [facility]  . IR GENERIC HISTORICAL  08/02/2016   IR US GUIDE VASC ACCESS RIGHT 08/02/2016 Shawna Keen, MD [facility]  . IR GENERIC HISTORICAL  08/02/2016   IR FLUORO GUIDE CV LINE RIGHT 08/02/2016 Shawna Keen, MD [facility]  . LEFT HEART CATHETERIZATION WITH CORONARY ANGIOGRAM N/A 12/15/2011   Procedure: LEFT HEART CATHETERIZATION WITH CORONARY ANGIOGRAM;  Surgeon: Shawna Blanks, MD;  Location: [facility] CATH LAB;  Service: Cardiovascular;  Laterality: N/A;  . LEFT HEART CATHETERIZATION WITH CORONARY ANGIOGRAM N/A 01/10/2012   Procedure: LEFT HEART CATHETERIZATION WITH CORONARY ANGIOGRAM;  Surgeon: Shawna M Martinique, MD;   Location: [facility] CATH LAB;  Service: Cardiovascular;  Laterality: N/A;  . LEFT HEART CATHETERIZATION WITH CORONARY ANGIOGRAM N/A 06/08/2012   Procedure: LEFT HEART CATHETERIZATION WITH CORONARY ANGIOGRAM;  Surgeon: Shawna Blanks, MD;  Location: [facility] CATH LAB;  Service: Cardiovascular;  Laterality: N/A;  . LEFT HEART CATHETERIZATION WITH CORONARY/GRAFT ANGIOGRAM N/A 12/10/2013   Procedure: LEFT HEART CATHETERIZATION WITH Beatrix Fetters;  Surgeon: [name], MD;  Location: [facility] CATH LAB;  Service: Cardiovascular;  Laterality: N/A;  . OVARY SURGERY     ovarian cancer  . SHUNTOGRAM N/A 10/15/2013   Procedure: Fistulogram;  Surgeon: Serafina Mitchell, MD;  Location: [facility] CATH LAB;  Service: Cardiovascular;  Laterality: N/A;  . THROMBECTOMY / ARTERIOVENOUS GRAFT REVISION  2011   left upper arm  . TUBAL LIGATION  1980's     Home Meds: Prior to Admission medications   Medication Sig Start Date End Date Taking? Authorizing Provider  ALPRAZolam (XANAX) 0.25 MG tablet Take 0.25-0.5 mg by mouth at bedtime as needed  for sleep.  11/19/15   Historical Provider, MD  amiodarone (PACERONE) 200 MG tablet Take 1 tablet (200 mg total) by mouth daily. 08/19/16   Shawna Commons, MD  amLODipine (NORVASC) 10 MG tablet Take 1 tablet (10 mg total) by mouth daily. 08/02/16   Kinnie Feil, MD  aspirin EC 81 MG tablet Take 81 mg by mouth daily.     Historical Provider, MD  cloNIDine (CATAPRES) 0.2 MG tablet Take 1 tablet (0.2 mg total) by mouth 3 (three) times daily. 09/02/16   Shawna Commons, MD  epoetin alfa (EPOGEN,PROCRIT) 56256 UNIT/ML injection Inject 1 mL (10,000 Units total) into the vein every Monday, Wednesday, and Friday with hemodialysis. 04/11/16   Orvan Falconer, MD  fluticasone (FLONASE) 50 MCG/ACT nasal spray Place 2 sprays into the nose daily as needed for allergies.     Historical Provider, MD  hydrALAZINE (APRESOLINE) 50 MG tablet Take 1 tablet (50 mg total) by mouth every 8 (eight)  hours. 07/10/16   Thurnell Lose, MD  isosorbide mononitrate (IMDUR) 30 MG 24 hr tablet Take 3 tablets (90 mg total) by mouth daily. 08/03/16   Kinnie Feil, MD  lanthanum (FOSRENOL) 1000 MG chewable tablet Chew 1 tablet (1,000 mg total) by mouth 3 (three) times daily with meals. 04/08/16   Orvan Falconer, MD  multivitamin (RENA-VIT) TABS tablet Take 1 tablet by mouth daily.    Historical Provider, MD  nitroGLYCERIN (NITROSTAT) 0.4 MG SL tablet Place 1 tablet (0.4 mg total) under the tongue every 5 (five) minutes as needed for chest pain. 08/02/16   Kinnie Feil, MD  oxyCODONE-acetaminophen (PERCOCET/ROXICET) 5-325 MG tablet Take 1 tablet by mouth every 6 (six) hours as needed. 09/06/16   Alvia Grove, PA-C  pantoprazole (PROTONIX) 40 MG tablet Take 1 tablet (40 mg total) by mouth daily. 08/03/16   Kinnie Feil, MD  PROAIR HFA 108 (90 BASE) MCG/ACT inhaler Inhale 1 puff into the lungs every 6 (six) hours as needed for wheezing or shortness of breath.  01/09/15   Historical Provider, MD  SENSIPAR 30 MG tablet Take 30 mg by mouth daily with supper.  12/21/12   Historical Provider, MD  simvastatin (ZOCOR) 20 MG tablet Take 1 tablet (20 mg total) by mouth at bedtime. 04/04/16   Lendon Colonel, NP    Current Medications: . amiodarone  200 mg Oral Daily  . amLODipine  10 mg Oral Daily  . aspirin EC  81 mg Oral Daily  . cloNIDine  0.2 mg Oral TID  . hydrALAZINE  50 mg Oral Q8H  . isosorbide mononitrate  90 mg Oral Daily  . simvastatin  20 mg Oral QHS     Allergies:    Allergies  Allergen Reactions  . Aspirin Other (See Comments)    High Doses Mess up her stomach; "makes my bowels have blood in them". Takes 81 mg EC Aspirin   . Penicillins Other (See Comments)    SYNCOPE? , "makes me real weak when I take it; like I'll pass out"  Has patient had a PCN reaction causing immediate rash, facial/tongue/throat swelling, SOB or lightheadedness with hypotension: no Has patient had a PCN  reaction causing severe rash involving mucus membranes or skin necrosis: no Has patient had a PCN reaction that required hospitalization no Has patient had a PCN reaction occurring within the last 10 years: no If all of the above  . Amlodipine Swelling and Other (See Comments)    Leg  swelling  . Bactrim [Sulfamethoxazole-Trimethoprim] Rash  . Contrast Media [Iodinated Diagnostic Agents] Itching  . Iron Itching and Other (See Comments)    "they gave me iron in dialysis; had to give me Benadryl cause I had to have the iron" (05/02/2012)  . Nitrofurantoin Hives  . Prilosec [Omeprazole] Other (See Comments)    "back spasms"  . Tylenol [Acetaminophen] Other (See Comments)    Makes her feet on fire per pt  . Dexilant [Dexlansoprazole] Other (See Comments)    Upset stomach  . Levaquin [Levofloxacin In D5w] Rash  . Morphine And Related Itching    Itching in feet  . Plavix [Clopidogrel Bisulfate] Rash  . Protonix [Pantoprazole Sodium] Rash  . Venofer [Ferric Oxide] Itching    Patient reports using Benadryl prior to doses as [place] History:   The patient  reports that she has never smoked. She has never used smokeless tobacco. She reports that she does not drink alcohol or use drugs.    Family History:   The patient's family history includes Diabetes in her brother, father, mother, and sister; Heart attack in her brother, brother, and mother; Heart disease in her father and mother; Hyperlipidemia in her brother, father, and mother; Hypertension in her father, mother, sister, and sister.   ROS:  Please see the history of present illness.  Review of Systems  Constitution: Positive for weakness and malaise/fatigue.  HENT: Negative.   Eyes: Negative.   Cardiovascular: Positive for chest pain, dyspnea on exertion and irregular heartbeat.  Respiratory: Negative.   Hematologic/Lymphatic: Negative.   Musculoskeletal: Positive for muscle weakness and myalgias. Negative for joint  pain.  Gastrointestinal: Positive for heartburn.  Genitourinary: Negative.   Neurological: Positive for numbness and paresthesias.   All other ROS reviewed and negative.      Vital Signs: Blood pressure (!) 142/51, pulse 69, temperature 97.9 F (36.6 C), temperature source Oral, resp. rate 20, height 5\' 1"  (1.549 m), weight 130 lb 15.3 oz (59.4 kg), SpO2 100 %.   PHYSICAL EXAM: General:  Well nourished, well developed, in no acute distress  HEENT: normal Lymph: no adenopathy Neck: no JVD Endocrine:  No thryomegaly Vascular: No carotid bruits; FA pulses 2+ bilaterally without bruits  Cardiac:  RRR; normal S1, V4;9/4 systolic murmur LSB,no rub, bruit, thrill, or heave Lungs:  clear to auscultation bilaterally, no wheezing, rhonchi or rales  Abd: soft, nontender, no hepatomegaly  Ext: trace ankle edema, decreased distal pulses bilaterally. RUE erythema where fistula removed, some swelling. LUE fistula no functional Musculoskeletal:  No deformities, BUE and BLE strength normal and equal Skin: warm and dry  Neuro:  CNs 2-12 intact, no focal abnormalities noted Psych:  Normal affect    EKG:  NSR with LVH and lateral ST changes more pronounced than 06-Aug-2016, personally reviewed  Telemetry: NSR, personally reviewed  Labs:  Recent Labs  09/07/16 0111 09/07/16 0340 09/07/16 0620  TROPONINI 0.03* 0.07* 0.14*   Lab Results  Component Value Date   WBC 11.0 (H) 09/07/2016   HGB 13.3 09/07/2016   HCT 40.7 09/07/2016   MCV 96.0 09/07/2016   PLT 320 09/07/2016    Recent Labs Lab 09/07/16 0135  NA 129*  K 5.0  CL 91*  CO2 21*  BUN 62*  CREATININE 9.48*  CALCIUM 8.2*  PROT 7.5  BILITOT 0.6  ALKPHOS 116  ALT 9*  AST 16  GLUCOSE 156*   Lab Results  Component Value Date   CHOL 129  07/07/2012   HDL 51 07/07/2012   LDLCALC 59 07/07/2012   TRIG 105 01/21/2015   Lab Results  Component Value Date   DDIMER 1.97 (H) 07/03/2012    Radiology/Studies:  Dg Chest 2  View  Result Date: 09/07/2016 CLINICAL DATA:  Acute onset of central chest pain, radiating to the back. Initial encounter. EXAM: CHEST  2 VIEW COMPARISON:  Chest radiograph performed 07/27/2016 FINDINGS: The lungs are well-aerated. Vascular congestion is noted. Bibasilar airspace opacities may reflect mild interstitial edema. Small bilateral pleural effusions are seen. No pneumothorax is seen. The heart is borderline enlarged. The patient is status post median sternotomy, with evidence of prior CABG. A right-sided dual-lumen catheter is noted ending about the cavoatrial junction. No acute osseous abnormalities are seen. Scattered calcification is seen along the abdominal aorta. IMPRESSION: 1. Vascular congestion and borderline cardiomegaly. Bibasilar airspace opacities may reflect mild interstitial edema. Small bilateral pleural effusions seen. 2. Scattered aortic atherosclerosis. Electronically Signed   By: Garald Balding M.D.   On: 09/07/2016 02:36    Transthoracic Echocardiography 07/28/16 Study Conclusions   - Left ventricle: Septal and apical hypokinesis. The cavity size   was normal. Wall thickness was normal. Systolic function was   normal. The estimated ejection fraction was in the range of 50%   to 55%. Doppler parameters are consistent with both elevated   ventricular end-diastolic filling pressure and elevated left   atrial filling pressure. - Mitral valve: There was mild regurgitation. Valve area by   continuity equation (using LVOT flow): 1.49 cm^2. - Left atrium: The atrium was moderately dilated. - Atrial septum: No defect or patent foramen ovale was identified. - Pulmonary arteries: PA peak pressure: 54 mm Hg (S).     MYOCARDIAL IMAGING WITH SPECT (REST AND PHARMACOLOGIC-STRESS) IMPRESSION: 1. There is a primarily fixed defect in the anterior wall and apex which is moderate in size. A small portion of it towards the base is reversible.   2. Global hypokinesis.   3. Left  ventricular ejection fraction 41%   4. Non invasive risk stratification*: Intermediate risk.    PROBLEM LIST:  Principal Problem:   Chest pain Active Problems:   Coronary atherosclerosis of native coronary artery   ESRD on hemodialysis (HCC)   HLD (hyperlipidemia)   AVM (arteriovenous malformation) of small bowel, acquired (HCC)   Anemia of chronic kidney failure   Hx of CABG   Chronic diastolic CHF (congestive heart failure) (HCC)    ASSESSMENT AND PLAN:  Chest pain with hypertensive urgency after fistula removal yesterday. Troponins elevated 0.07 0.14 most likely demand ischemia. Has CAD with prior CABG. Last cath 2015. She had a recent Myoview and decision made last month to treat medically given GIB/no IV access/contrast dye allergy. See above dictation for details. Patient pain free now and would like to continue medical management.  GI bleed with cautery of AVM's last month  Hypertension with recurrent hypertensive emergencies on Norvasc 10 mg, clonidine 0.2mg  TID, hydralazine 50 mg TID, imdur 90 mg daily. Could increase hydralazine for better BP control. BP better today. ? Secondary to stress/pain of fistula removal yesterday.  Diastolic CHF compensated, managed with dialysis  ESRD on HD now with temporary access since fistula removed yesterday.  Carotid disease  Hx PSVT on Amiodaraone since 09/2015   SignedErmalinda Barrios, PA-C  09/07/2016 9:25 AM    Attending note:  Patient seen and examined. Reviewed records and discussed the case with Ms. Bonnell Public PA-C. Ms. Pursifull presents with chest pain  in the setting of hypertensive emergency, recent dialysis fistula revision. She has medically managed ischemic heart disease based on recent workup including Myoview in January in the setting of NSTEMI. Troponin trend during this hospital stay 0.07, 0.14, 0.21. ECG reviewed which shows largely chronic ST-T wave abnormalities.  On admission this morning she appears comfortable, no  active chest pain. Systolic blood pressure 945O to 140s, heart rate in the 60s and in sinus rhythm by telemetry. Lungs exhibit decreased breath sounds but no crackles. Cardiac exam without gallop. She is preparing for hemodialysis today.  Patient presents with chest pain the setting of hypertensive emergency and demand ischemia. Current cardiac regimen includes aspirin, amiodarone, Norvasc, clonidine, hydralazine, Imdur, and Zocor. Based on recent cardiac testing within the last month, would recommend continuing medical therapy aimed at hypertension control. No further cardiac testing is anticipated at this time. We will arrange a follow-up visit for her to see Dr. Bronson [name] in the office.  Satira Sark, M.D., F.A.C.C.

## 2016-09-07 NOTE — Care Management Note (Signed)
Case Management Note  Patient Details  Name: Shawna [name] MRN: [id number] Date of Birth: 08/05/1940  Subjective/Objective:                  Pt admitted with chest pain/arm weakness. Pt is from home, she lives with her husband. She has PCP, her husband drives her to appointments. She has inusrance with drug coverage. She receives HD MWF at [facility] in [place]. Pt has cane and walker at home PTA. She uses the walker with ambulation the most. She has no oxygen or respiratory DME pta. Pt reports being active with [facility] pta.   Action/Plan: Pt plans to return home with resumption of [facility] services. CM has contacted [facility] in [place], VM left with intake coordinator. CM will cont to follow for DC planning. If transitioned to inpt will need order to resume [facility] services.   Expected Discharge Date:    09/09/2016               Expected Discharge Plan:  [place]  In-House Referral:  NA  Discharge planning Services  CM Consult  Post Acute Care Choice:  NA Choice offered to:  NA  [name] Agency:  [facility]  Status of Service:  In process, will continue to follow  Sherald Barge, RN 09/07/2016, 11:01 AM

## 2016-09-07 NOTE — H&P (Signed)
History and Physical    Shawna [name] TFT:[id number] DOB: [birth year] DOA: 09/06/2016  PCP: [name], MD  Patient coming from: home  Chief Complaint:  Chest pain  HPI: Shawna [name] is a 76 y.o. female with medical history significant of  ESRD, CAD s/p CABG being medically managed at this time due to recent GIB from AVM, CHF, HTN [facility] today for Steal syndrome comes in with sscp that lasted for several hours relieved with ntg x 3 in the ED.  The pain did not radiate.  No chest pain since arrival to ED.  Apparently pt was found to have abnormal stress testing but could not have cath due to a recent GIB due to an AVM.  Today during her vascular surgery, AV graft pt says the graft was placed then had to be removed because her arm turned blue and she lost feeling in it (see vascular op  Notes by dr Scot Dock and fields from yesterday).  Pt is upset because she is tired of being poked and proded.  She is refusing lab draws for her serial troponin because she doesn't understand why she has to be stuck again.  She denies any fevers.  No nv/d.  She feels fine now.  No sob.  No swelling in her legs.  Pt referred for admission for possible ACS.   Review of Systems: As per HPI otherwise 10 point review of systems negative.   Past Medical History:  Diagnosis Date  . Anxiety   . Arthritis   . AVM (arteriovenous malformation) of colon   . CAD (coronary artery disease)    a. 12/2011 NSTEMI/Cath/PCI LCX (2.25x14 Resolute DES) & D1 (2.25x22 Resolute DES);  b. 01/2012 Cath/PCI: LM 30, LAD 30p, 40-86m, D1 stent ok, 99 in sm branch of diag, LCX patent stent, OM1 20, RCA 95 ost (4.0x12 Promus DES), EF 55%;  c. 04/2012 Lexi Cardiolite  EF 48%, small area of scar @ base/mid inflat wall with mild peri-infarct ischemia.; CABG 12/4  . Carotid artery disease ([place])    a. 70-62% LICA, 09/7626   . Chronic bronchitis ([place])   . Chronic diastolic CHF (congestive heart failure) ([place])    a. 02/2012 Echo EF  60-65%, nl wall motion, Gr 1 DD, mod MR  . Colon cancer ([place]) 1992  . Esophageal stricture   . ESRD on hemodialysis ([place])    ESRD due to HTN, started dialysis 2011 and gets HD at [facility] with Dr Hinda Lenis on MWF schedule.  Access is LUA AVF as of Sept 2014.   Marland Kitchen GERD (gastroesophageal reflux disease)   . High cholesterol 12/2011  . History of blood transfusion 07/2011; 12/2011; 01/2012 X 2; 04/2012  . History of gout   . History of lower GI bleeding   . Hypertension   . Iron deficiency anemia   . Mitral regurgitation    a. Moderate by echo, 02/2012  . Myocardial infarction   . Ovarian cancer ([place]) 1992  . Pneumonia ~ 2009  . PUD (peptic ulcer disease)   . TIA (transient ischemic attack)     Past Surgical History:  Procedure Laterality Date  . ABDOMINAL HYSTERECTOMY  1992  . APPENDECTOMY  06/1990  . AV FISTULA PLACEMENT  07/2009   left upper arm  . COLON RESECTION  1992  . CORONARY ANGIOPLASTY WITH STENT PLACEMENT  12/15/11   "2"  . CORONARY ANGIOPLASTY WITH STENT PLACEMENT  y/2013   "1; makes total of 3" (05/02/2012)  .  CORONARY ARTERY BYPASS GRAFT  06/13/2012   Procedure: CORONARY ARTERY BYPASS GRAFTING (CABG);  Surgeon: Grace Isaac, MD;  Location: [place];  Service: Open Heart Surgery;  Laterality: N/A;  cabg x four;  using left internal mammary artery, and left leg greater saphenous vein harvested endoscopically  . DILATION AND CURETTAGE OF UTERUS    . ESOPHAGOGASTRODUODENOSCOPY  01/20/2012   Procedure: ESOPHAGOGASTRODUODENOSCOPY (EGD);  Surgeon: Ladene Artist, MD,FACG;  Location[facility] ENDOSCOPY;  Service: Endoscopy;  Laterality: N/A;  . ESOPHAGOGASTRODUODENOSCOPY N/A 03/26/2013   Procedure: ESOPHAGOGASTRODUODENOSCOPY (EGD);  Surgeon: Irene Shipper, MD;  Location: [facility] ENDOSCOPY;  Service: Endoscopy;  Laterality: N/A;  . ESOPHAGOGASTRODUODENOSCOPY N/A 04/30/2015   Procedure: ESOPHAGOGASTRODUODENOSCOPY (EGD);  Surgeon: Rogene Houston, MD;  [facility];  Service:  Endoscopy;  Laterality: N/A;  1pm - moved to 10/20 @ 1:10  . ESOPHAGOGASTRODUODENOSCOPY N/A 07/29/2016   Procedure: ESOPHAGOGASTRODUODENOSCOPY (EGD);  Surgeon: Manus Gunning, MD;  Location: [place];  Service: Gastroenterology;  Laterality: N/A;  enteroscopy  . INTRAOPERATIVE TRANSESOPHAGEAL ECHOCARDIOGRAM  06/13/2012   Procedure: INTRAOPERATIVE TRANSESOPHAGEAL ECHOCARDIOGRAM;  Surgeon: Grace Isaac, MD;  Location: [place];  Service: Open Heart Surgery;  Laterality: N/A;  . IR GENERIC HISTORICAL  07/26/2016   IR FLUORO GUIDE CV LINE RIGHT 07/26/2016 Greggory Keen, MD [facility]  . IR GENERIC HISTORICAL  07/26/2016   IR US GUIDE VASC ACCESS RIGHT 07/26/2016 Greggory Keen, MD [facility]  . IR GENERIC HISTORICAL  08/02/2016   IR US GUIDE VASC ACCESS RIGHT 08/02/2016 Greggory Keen, MD [facility]  . IR GENERIC HISTORICAL  08/02/2016   IR FLUORO GUIDE CV LINE RIGHT 08/02/2016 Greggory Keen, MD [facility]  . LEFT HEART CATHETERIZATION WITH CORONARY ANGIOGRAM N/A 12/15/2011   Procedure: LEFT HEART CATHETERIZATION WITH CORONARY ANGIOGRAM;  Surgeon: Burnell Blanks, MD;  Location: [facility] CATH LAB;  Service: Cardiovascular;  Laterality: N/A;  . LEFT HEART CATHETERIZATION WITH CORONARY ANGIOGRAM N/A 01/10/2012   Procedure: LEFT HEART CATHETERIZATION WITH CORONARY ANGIOGRAM;  Surgeon: Peter M Martinique, MD;  Location: [facility] CATH LAB;  Service: Cardiovascular;  Laterality: N/A;  . LEFT HEART CATHETERIZATION WITH CORONARY ANGIOGRAM N/A 06/08/2012   Procedure: LEFT HEART CATHETERIZATION WITH CORONARY ANGIOGRAM;  Surgeon: Burnell Blanks, MD;  Location: [facility] CATH LAB;  Service: Cardiovascular;  Laterality: N/A;  . LEFT HEART CATHETERIZATION WITH CORONARY/GRAFT ANGIOGRAM N/A 12/10/2013   Procedure: LEFT HEART CATHETERIZATION WITH Beatrix Fetters;  Surgeon: [name], MD;  Location: [facility] CATH LAB;  Service: Cardiovascular;  Laterality: N/A;  . OVARY SURGERY     ovarian cancer  .  SHUNTOGRAM N/A 10/15/2013   Procedure: Fistulogram;  Surgeon: Serafina Mitchell, MD;  Location: [facility] CATH LAB;  Service: Cardiovascular;  Laterality: N/A;  . THROMBECTOMY / ARTERIOVENOUS GRAFT REVISION  2011   left upper arm  . TUBAL LIGATION  1980's     reports that she has never smoked. She has never used smokeless tobacco. She reports that she does not drink alcohol or use drugs.  Allergies  Allergen Reactions  . Aspirin Other (See Comments)    High Doses Mess up her stomach; "makes my bowels have blood in them". Takes 81 mg EC Aspirin   . Penicillins Other (See Comments)    SYNCOPE? , "makes me real weak when I take it; like I'll pass out"  Has patient had a PCN reaction causing immediate rash, facial/tongue/throat swelling, SOB or lightheadedness with hypotension: no Has patient had a PCN reaction causing severe  rash involving mucus membranes or skin necrosis: no Has patient had a PCN reaction that required hospitalization no Has patient had a PCN reaction occurring within the last 10 years: no If all of the above  . Amlodipine Swelling and Other (See Comments)    Leg swelling  . Bactrim [Sulfamethoxazole-Trimethoprim] Rash  . Contrast Media [Iodinated Diagnostic Agents] Itching  . Iron Itching and Other (See Comments)    "they gave me iron in dialysis; had to give me Benadryl cause I had to have the iron" (05/02/2012)  . Nitrofurantoin Hives  . Prilosec [Omeprazole] Other (See Comments)    "back spasms"  . Tylenol [Acetaminophen] Other (See Comments)    Makes her feet on fire per pt  . Dexilant [Dexlansoprazole] Other (See Comments)    Upset stomach  . Levaquin [Levofloxacin In D5w] Rash  . Morphine And Related Itching    Itching in feet  . Plavix [Clopidogrel Bisulfate] Rash  . Protonix [Pantoprazole Sodium] Rash  . Venofer [Ferric Oxide] Itching    Patient reports using Benadryl prior to doses as [place]    Family History  Problem Relation Age of Onset  . Heart  disease Mother     Heart Disease before age 85  . Hyperlipidemia Mother   . Hypertension Mother   . Diabetes Mother   . Heart attack Mother   . Heart disease Father     Heart Disease before age 86  . Hyperlipidemia Father   . Hypertension Father   . Diabetes Father   . Diabetes Sister   . Hypertension Sister   . Diabetes Brother   . Hyperlipidemia Brother   . Heart attack Brother   . Hypertension Sister   . Heart attack Brother   . Other      noncontributory for early CAD  . Colon cancer Neg Hx   . Esophageal cancer Neg Hx   . Liver disease Neg Hx   . Kidney disease Neg Hx   . Colon polyps Neg Hx     Prior to Admission medications   Medication Sig Start Date End Date Taking? Authorizing Provider  ALPRAZolam (XANAX) 0.25 MG tablet Take 0.25-0.5 mg by mouth at bedtime as needed for sleep.  11/19/15   Historical Provider, MD  amiodarone (PACERONE) 200 MG tablet Take 1 tablet (200 mg total) by mouth daily. 08/19/16   Herminio Commons, MD  amLODipine (NORVASC) 10 MG tablet Take 1 tablet (10 mg total) by mouth daily. 08/02/16   Kinnie Feil, MD  aspirin EC 81 MG tablet Take 81 mg by mouth daily.     Historical Provider, MD  cloNIDine (CATAPRES) 0.2 MG tablet Take 1 tablet (0.2 mg total) by mouth 3 (three) times daily. 09/02/16   Herminio Commons, MD  epoetin alfa (EPOGEN,PROCRIT) 97989 UNIT/ML injection Inject 1 mL (10,000 Units total) into the vein every Monday, Wednesday, and Friday with hemodialysis. 04/11/16   Orvan Falconer, MD  fluticasone (FLONASE) 50 MCG/ACT nasal spray Place 2 sprays into the nose daily as needed for allergies.     Historical Provider, MD  hydrALAZINE (APRESOLINE) 50 MG tablet Take 1 tablet (50 mg total) by mouth every 8 (eight) hours. 07/10/16   Thurnell Lose, MD  isosorbide mononitrate (IMDUR) 30 MG 24 hr tablet Take 3 tablets (90 mg total) by mouth daily. 08/03/16   Kinnie Feil, MD  lanthanum (FOSRENOL) 1000 MG chewable tablet Chew 1 tablet (1,000 mg  total) by mouth 3 (three)  times daily with meals. 04/08/16   Orvan Falconer, MD  multivitamin (RENA-VIT) TABS tablet Take 1 tablet by mouth daily.    Historical Provider, MD  nitroGLYCERIN (NITROSTAT) 0.4 MG SL tablet Place 1 tablet (0.4 mg total) under the tongue every 5 (five) minutes as needed for chest pain. 08/02/16   Kinnie Feil, MD  oxyCODONE-acetaminophen (PERCOCET/ROXICET) 5-325 MG tablet Take 1 tablet by mouth every 6 (six) hours as needed. 09/06/16   Alvia Grove, PA-C  pantoprazole (PROTONIX) 40 MG tablet Take 1 tablet (40 mg total) by mouth daily. 08/03/16   Kinnie Feil, MD  PROAIR HFA 108 (90 BASE) MCG/ACT inhaler Inhale 1 puff into the lungs every 6 (six) hours as needed for wheezing or shortness of breath.  01/09/15   Historical Provider, MD  SENSIPAR 30 MG tablet Take 30 mg by mouth daily with supper.  12/21/12   Historical Provider, MD  simvastatin (ZOCOR) 20 MG tablet Take 1 tablet (20 mg total) by mouth at bedtime. 04/04/16   Lendon Colonel, NP    Physical Exam: Vitals:   09/06/16 2328 09/06/16 2331 09/07/16 0000 09/07/16 0100  BP:  (!) 161/103 180/71 162/89  Pulse:  86 80 83  Resp:  16 20   TempSrc:  Oral    SpO2:  96% 97% 99%  Weight: 59.4 kg (131 lb)     Height: 5\' 2"  (1.575 m)      Constitutional: NAD, calm, comfortable Vitals:   09/06/16 2328 09/06/16 2331 09/07/16 0000 09/07/16 0100  BP:  (!) 161/103 180/71 162/89  Pulse:  86 80 83  Resp:  16 20   TempSrc:  Oral    SpO2:  96% 97% 99%  Weight: 59.4 kg (131 lb)     Height: 5\' 2"  (1.575 m)      Eyes: PERRL, lids and conjunctivae normal ENMT: Mucous membranes are moist. Posterior pharynx clear of any exudate or lesions.Normal dentition.  Neck: normal, supple, no masses, no thyromegaly Respiratory: clear to auscultation bilaterally, no wheezing, no crackles. Normal respiratory effort. No accessory muscle use.  Cardiovascular: Regular rate and rhythm, no murmurs / rubs / gallops. No extremity edema. 2+  pedal pulses. No carotid bruits.  Abdomen: no tenderness, no masses palpated. No hepatosplenomegaly. Bowel sounds positive.  Musculoskeletal: no clubbing / cyanosis. No joint deformity upper and lower extremities. Good ROM, no contractures. Normal muscle tone.  Skin: no rashes, lesions, ulcers. No induration Neurologic: CN 2-12 grossly intact. Sensation intact, DTR normal. Strength 5/5 in all 4.  Psychiatric: Normal judgment and insight. Alert and oriented x 3. Normal mood.    Labs on Admission: I have personally reviewed following labs and imaging studies  CBC:  Recent Labs Lab 09/06/16 0645 09/07/16 0135  WBC  --  11.0*  NEUTROABS  --  8.6*  HGB 11.2* 13.3  HCT 33.0* 40.7  MCV  --  96.0  PLT  --  474   Basic Metabolic Panel:  Recent Labs Lab 09/06/16 0645 09/07/16 0135  NA 130* 129*  K 4.9 5.0  CL  --  91*  CO2  --  21*  GLUCOSE 110* 156*  BUN  --  62*  CREATININE  --  9.48*  CALCIUM  --  8.2*   GFR: Estimated Creatinine Clearance: 4 mL/min (by C-G formula based on SCr of 9.48 mg/dL (H)). Liver Function Tests:  Recent Labs Lab 09/07/16 0135  AST 16  ALT 9*  ALKPHOS 116  BILITOT 0.6  PROT  7.5  ALBUMIN 3.8   Coagulation Profile:  Recent Labs Lab 09/07/16 0135  INR 0.96   Cardiac Enzymes:  Recent Labs Lab 09/07/16 0111  TROPONINI 0.03*    Radiological Exams on Admission: Dg Chest 2 View  Result Date: 09/07/2016 CLINICAL DATA:  Acute onset of central chest pain, radiating to the back. Initial encounter. EXAM: CHEST  2 VIEW COMPARISON:  Chest radiograph performed 07/27/2016 FINDINGS: The lungs are well-aerated. Vascular congestion is noted. Bibasilar airspace opacities may reflect mild interstitial edema. Small bilateral pleural effusions are seen. No pneumothorax is seen. The heart is borderline enlarged. The patient is status post median sternotomy, with evidence of prior CABG. A right-sided dual-lumen catheter is noted ending about the cavoatrial  junction. No acute osseous abnormalities are seen. Scattered calcification is seen along the abdominal aorta. IMPRESSION: 1. Vascular congestion and borderline cardiomegaly. Bibasilar airspace opacities may reflect mild interstitial edema. Small bilateral pleural effusions seen. 2. Scattered aortic atherosclerosis. Electronically Signed   By: Garald Balding M.D.   On: 09/07/2016 02:36    EKG: Independently reviewed. nsr no acute changes  Assessment/Plan 76 yo female with complicated vascular history comes in with chest pain  Principal Problem:   Chest pain- serial troponin.  Consult cardiology to evaluate if and when pt would be a good cath candidate, doubt now since her vascular surgery yesterday unless her trop significantly rises.  Await cards recommendations.  Active Problems:   Coronary atherosclerosis of native coronary artery- noted   ESRD on hemodialysis [facility])- nephrology consulted for dialysis needs   HLD (hyperlipidemia)- noted   AVM (arteriovenous malformation) of small bowel, acquired ([place])- noted, heme neg at this time   Anemia of chronic kidney failure- at baseline   Hx of CABG- noted   Chronic diastolic CHF (congestive heart failure) ([place])- stable and compensated at this time  Med rec is pending.  DVT prophylaxis:  scds Code Status:  full Family Communication: none  Disposition Plan:  Per day team Consults called:  Cardiology and nephrology requested in EPIC Admission status:  observation   Ainara Eldridge A MD [facility]  If 7PM-7AM, please contact night-coverage [email or web address] Password [facility]  09/07/2016, 2:55 AM

## 2016-09-08 DIAGNOSIS — I5032 Chronic diastolic (congestive) heart failure: Secondary | ICD-10-CM

## 2016-09-08 DIAGNOSIS — Z951 Presence of aortocoronary bypass graft: Secondary | ICD-10-CM | POA: Diagnosis not present

## 2016-09-08 DIAGNOSIS — Z992 Dependence on renal dialysis: Secondary | ICD-10-CM | POA: Diagnosis not present

## 2016-09-08 DIAGNOSIS — N185 Chronic kidney disease, stage 5: Secondary | ICD-10-CM

## 2016-09-08 DIAGNOSIS — I10 Essential (primary) hypertension: Secondary | ICD-10-CM | POA: Diagnosis not present

## 2016-09-08 DIAGNOSIS — N186 End stage renal disease: Secondary | ICD-10-CM | POA: Diagnosis not present

## 2016-09-08 DIAGNOSIS — I2510 Atherosclerotic heart disease of native coronary artery without angina pectoris: Secondary | ICD-10-CM

## 2016-09-08 DIAGNOSIS — D631 Anemia in chronic kidney disease: Secondary | ICD-10-CM | POA: Diagnosis not present

## 2016-09-08 DIAGNOSIS — R079 Chest pain, unspecified: Secondary | ICD-10-CM | POA: Diagnosis not present

## 2016-09-08 DIAGNOSIS — E7800 Pure hypercholesterolemia, unspecified: Secondary | ICD-10-CM

## 2016-09-08 DIAGNOSIS — R0789 Other chest pain: Secondary | ICD-10-CM | POA: Diagnosis not present

## 2016-09-08 LAB — HEPATITIS B SURFACE ANTIGEN: Hepatitis B Surface Ag: NEGATIVE

## 2016-09-08 MED ORDER — SODIUM CHLORIDE 0.9 % IV SOLN: 100.0000 mL | INTRAVENOUS | Status: DC | PRN

## 2016-09-08 MED ORDER — DOXYCYCLINE HYCLATE 100 MG PO CAPS: 100.0000 mg | ORAL_CAPSULE | Freq: Two times a day (BID) | ORAL | 0 refills | Status: DC

## 2016-09-08 NOTE — Progress Notes (Signed)
Is asSubjective: Interval History: has no complaint of .  is in chest pain. Presently she'[name] feeling much better. No Chest pain. Patient denies any difficulty breathing.o N nausea or vomiting Objective: Vital signs in last 24 hours: Temp:  [98.3 F (36.8 C)-99 F (37.2 C)] 98.5 F (36.9 C) (03/01 0623) Pulse Rate:  [65-72] 72 (03/01 0623) Resp:  [16-20] 16 (03/01 0623) BP: (116-211)/(26-95) 149/60 (03/01 0623) SpO2:  [97 %-100 %] 100 % (03/01 0623) Weight:  [59.4 kg (130 lb 15.3 oz)] 59.4 kg (130 lb 15.3 oz) (02/28 1300) Weight change: -0.021 kg (-0.8 oz)  Intake/Output from previous day: 02/28 0701 - 03/01 0700 In: 720 [P.O.:720] Out: 2000  Intake/Output this shift: No intake/output data recorded.  General appearance: alert, cooperative and no distress Resp: clear to auscultation bilaterally Cardio: regular rate and rhythm Extremities: Patient with some swelling and erythema on her left arm where an AV graft was removed. She doesn't have any edema.  Lab Results:  Recent Labs  09/06/16 0645 09/07/16 0135  WBC  --  11.0*  HGB 11.2* 13.3  HCT 33.0* 40.7  PLT  --  320   BMET:  Recent Labs  09/06/16 0645 09/07/16 0135  NA 130* 129*  K 4.9 5.0  CL  --  91*  CO2  --  21*  GLUCOSE 110* 156*  BUN  --  62*  CREATININE  --  9.48*  CALCIUM  --  8.2*   No results for input([name]): PTH in the last 72 hours. Iron Studies: No results for input([name]): IRON, TIBC, TRANSFERRIN, FERRITIN in the last 72 hours.  Studies/Results: Dg Chest 2 View  Result Date: 09/07/2016 CLINICAL DATA:  Acute onset of central chest pain, radiating to the back. Initial encounter. EXAM: CHEST  2 VIEW COMPARISON:  Chest radiograph performed 07/27/2016 FINDINGS: The lungs are well-aerated. Vascular congestion is noted. Bibasilar airspace opacities may reflect mild interstitial edema. Small bilateral pleural effusions are seen. No pneumothorax is seen. The heart is borderline enlarged. The patient is status  post median sternotomy, with evidence of prior CABG. A right-sided dual-lumen catheter is noted ending about the cavoatrial junction. No acute osseous abnormalities are seen. Scattered calcification is seen along the abdominal aorta. IMPRESSION: 1. Vascular congestion and borderline cardiomegaly. Bibasilar airspace opacities may reflect mild interstitial edema. Small bilateral pleural effusions seen. 2. Scattered aortic atherosclerosis. Electronically Signed   By: Garald Balding M.D.   On: 09/07/2016 02:36    I have reviewed the patient'[name] current medications.  Assessment/Plan:  Problem #1 end-stage and renal disease: She is status post hemodialysis yesterday. Presently patient does not have any nausea or vomiting. Her potassium is normal. Problem #2 chest pain: Presently she is feeling better. Her troponin is slightly higher. Patient with recurrent admission because of poorly controlled hypertension and chest pain. Recently she was admitted for chest pain and was found to have NSTEMI. Problem #3 placement of right AV graft on 09/06/16 and subsequent removal because of steal syndrome. Problem #4 hypertension: Her blood pressure is reasonably controlled Problem #5 fluid management: Patient does not have any sign and symptoms of fluid overload. We are able to remove about 2 L yesterday Problem #6 metabolic bone disease: Calcium is in range Problem #7 anemia: Her hemoglobin is up to our target goal and presently is off Epogen. Plan: 1]We'll make arrangements for patient to get dialysis tomorrow if she'[name] not going to be discharged.           2]If patient  is going to go home today. We'll go to her regular dialysis unit in a.m.           3]We'll check her renal panel in the morning           4]We'll remove 2 L of systolic blood pressure above 90 tomorrow with dialysis.    LOS: 0 days   Shawna [name] 09/08/2016,8:33 AM

## 2016-09-08 NOTE — Discharge Summary (Signed)
Physician Discharge Summary  Shawna [name] YQM:[id number] DOB: 11/03/1940 DOA: 09/06/2016  PCP: [name], MD  Admit date: 09/06/2016 Discharge date: 09/08/2016  Admitted From: Home  Disposition:  Home   Recommendations for Outpatient Follow-up:  1. Follow up with PCP in 1 weeks 2. Follow up with vascular surgery as scheduled 3. Follow up with cardiology as scheduled.  4. Please obtain BMP/CBC in one week  Discharge Condition: STABLE  CODE STATUS: FULL   Brief/Interim Summary: HPI: Shawna [name] is a 76 y.o. female with medical history significant of  ESRD, CAD s/p CABG being medically managed at this time due to recent GIB from AVM, CHF, HTN had [facility] today for Steal syndrome comes in with sscp that lasted for several hours relieved with ntg x 3 in the ED.  The pain did not radiate.  No chest pain since arrival to ED.  Apparently pt was found to have abnormal stress testing but could not have cath due to a recent GIB due to an AVM.  Today during her vascular surgery, AV graft pt says the graft was placed then had to be removed because her arm turned blue and she lost feeling in it (see vascular op  Notes by dr Scot Dock and fields from yesterday).  Pt is upset because she is tired of being poked and proded.  She is refusing lab draws for her serial troponin because she doesn't understand why she has to be stuck again.  She denies any fevers.  No nv/d.  She feels fine now.  No sob.  No swelling in her legs.  Pt referred for admission for possible ACS.  Chest Pain - Patient was evaluated by cardiology and they did not feel that further workup was needed. They did arrange for her to have close outpatient follow-up with her primary cardiologist. Patient had no more complaints of chest pain. They felt that her mild bump in troponin was demand related. Please see cardiology consult notes for further details.  End-stage renal disease on hemodialysis-patient was seen by nephrologist.  Please see consultation notes. Patient had hemodialysis in the hospital and tolerated it well. The patient did not have signs or symptoms of fluid overload. She had 2 L of fluid removed from hemodialysis in the hospital. She will resume regularly scheduled hemodialysis tomorrow.  Chronic diastolic congestive heart failure-stable and compensated at this time.  Recent Steal syndrome right upper extremity status post right arm AV graft/Failed right AV fistula status post recent removal-there is some redness in the area and we will prescribe doxycycline 100 mg twice a day for 7 days. Patient has close follow-up arranged with her vascular surgeon.  Patient recently had removal of the right forearm Loop graft. She had a vein patch angioplasty of the right brachial artery. She is supposed to be following up with the vascular surgeons in the next weeks. If she has any complications with this I advised her to see her vascular surgeons immediately. Patient verbalized understanding. For now she has temporary dialysis access in way of a port in the chest.  Hyperlipidemia-stable on current medications.  AVM/history reported-heme negative at this time.  Anemia of chronic kidney failure-stable at baseline.  Discharge Diagnoses:  Principal Problem:   Chest pain Active Problems:   Coronary atherosclerosis of native coronary artery   ESRD on hemodialysis (HCC)   HLD (hyperlipidemia)   AVM (arteriovenous malformation) of small bowel, acquired (HCC)   Anemia of chronic kidney failure   Hx  of CABG   Chronic diastolic CHF (congestive heart failure) [facility])  Discharge Instructions  Discharge Instructions    Increase activity slowly    Complete by:  As directed      Allergies as of 09/08/2016      Reactions   Aspirin Other (See Comments)   High Doses Mess up her stomach; "makes my bowels have blood in them". Takes 81 mg EC Aspirin    Penicillins Other (See Comments)   SYNCOPE? , "makes me real weak when I  take it; like I'll pass out" Has patient had a PCN reaction causing immediate rash, facial/tongue/throat swelling, SOB or lightheadedness with hypotension: no Has patient had a PCN reaction causing severe rash involving mucus membranes or skin necrosis: no Has patient had a PCN reaction that required hospitalization no Has patient had a PCN reaction occurring within the last 10 years: no If all of the above   Amlodipine Swelling, Other (See Comments)   Leg swelling   Bactrim [sulfamethoxazole-trimethoprim] Rash   Contrast Media [iodinated Diagnostic Agents] Itching   Iron Itching, Other (See Comments)   "they gave me iron in dialysis; had to give me Benadryl cause I had to have the iron" (05/02/2012)   Nitrofurantoin Hives   Prilosec [omeprazole] Other (See Comments)   "back spasms"   Tylenol [acetaminophen] Other (See Comments)   Makes her feet on fire per pt   Dexilant [dexlansoprazole] Other (See Comments)   Upset stomach   Levaquin [levofloxacin In D5w] Rash   Morphine And Related Itching   Itching in feet   Plavix [clopidogrel Bisulfate] Rash   Protonix [pantoprazole Sodium] Rash   Venofer [ferric Oxide] Itching   Patient reports using Benadryl prior to doses as [place]      Medication List    STOP taking these medications   amLODipine 10 MG tablet Commonly known as:  NORVASC     TAKE these medications   ALPRAZolam 0.25 MG tablet Commonly known as:  XANAX Take 0.25-0.5 mg by mouth at bedtime as needed for sleep.   amiodarone 200 MG tablet Commonly known as:  PACERONE Take 1 tablet (200 mg total) by mouth daily.   aspirin EC 81 MG tablet Take 81 mg by mouth daily.   cloNIDine 0.2 MG tablet Commonly known as:  CATAPRES Take 1 tablet (0.2 mg total) by mouth 3 (three) times daily.   doxycycline 100 MG capsule Commonly known as:  VIBRAMYCIN Take 1 capsule (100 mg total) by mouth 2 (two) times daily.   epoetin alfa 10000 UNIT/ML injection Commonly known  as:  EPOGEN,PROCRIT Inject 1 mL (10,000 Units total) into the vein every Monday, Wednesday, and Friday with hemodialysis.   fluticasone 50 MCG/ACT nasal spray Commonly known as:  FLONASE Place 2 sprays into the nose daily as needed for allergies.   hydrALAZINE 50 MG tablet Commonly known as:  APRESOLINE Take 1 tablet (50 mg total) by mouth every 8 (eight) hours.   isosorbide mononitrate 30 MG 24 hr tablet Commonly known as:  IMDUR Take 3 tablets (90 mg total) by mouth daily.   lanthanum 1000 MG chewable tablet Commonly known as:  FOSRENOL Chew 1 tablet (1,000 mg total) by mouth 3 (three) times daily with meals.   multivitamin Tabs tablet Take 1 tablet by mouth daily.   nitroGLYCERIN 0.4 MG SL tablet Commonly known as:  NITROSTAT Place 1 tablet (0.4 mg total) under the tongue every 5 (five) minutes as needed for chest pain.  oxyCODONE-acetaminophen 5-325 MG tablet Commonly known as:  PERCOCET/ROXICET Take 1 tablet by mouth every 6 (six) hours as needed.   pantoprazole 40 MG tablet Commonly known as:  PROTONIX Take 1 tablet (40 mg total) by mouth daily.   PROAIR HFA 108 (90 Base) MCG/ACT inhaler Generic drug:  albuterol Inhale 1 puff into the lungs every 6 (six) hours as needed for wheezing or shortness of breath.   SENSIPAR 30 MG tablet Generic drug:  cinacalcet Take 30 mg by mouth daily with supper.   simvastatin 20 MG tablet Commonly known as:  ZOCOR Take 1 tablet (20 mg total) by mouth at bedtime.      Follow-up Information    Jory Sims, NP Follow up on 09/22/2016.   Specialties:  Nurse Practitioner, Radiology, Cardiology Why:  at 2:10 pm Contact information: [place] [address] [phone]        [name], MD. Schedule an appointment as soon as possible for a visit in 1 week(s).   Specialty:  Family Medicine Contact information: [address] [place] [address] [phone]          Allergies  Allergen Reactions  .  Aspirin Other (See Comments)    High Doses Mess up her stomach; "makes my bowels have blood in them". Takes 81 mg EC Aspirin   . Penicillins Other (See Comments)    SYNCOPE? , "makes me real weak when I take it; like I'll pass out"  Has patient had a PCN reaction causing immediate rash, facial/tongue/throat swelling, SOB or lightheadedness with hypotension: no Has patient had a PCN reaction causing severe rash involving mucus membranes or skin necrosis: no Has patient had a PCN reaction that required hospitalization no Has patient had a PCN reaction occurring within the last 10 years: no If all of the above  . Amlodipine Swelling and Other (See Comments)    Leg swelling  . Bactrim [Sulfamethoxazole-Trimethoprim] Rash  . Contrast Media [Iodinated Diagnostic Agents] Itching  . Iron Itching and Other (See Comments)    "they gave me iron in dialysis; had to give me Benadryl cause I had to have the iron" (05/02/2012)  . Nitrofurantoin Hives  . Prilosec [Omeprazole] Other (See Comments)    "back spasms"  . Tylenol [Acetaminophen] Other (See Comments)    Makes her feet on fire per pt  . Dexilant [Dexlansoprazole] Other (See Comments)    Upset stomach  . Levaquin [Levofloxacin In D5w] Rash  . Morphine And Related Itching    Itching in feet  . Plavix [Clopidogrel Bisulfate] Rash  . Protonix [Pantoprazole Sodium] Rash  . Venofer [Ferric Oxide] Itching    Patient reports using Benadryl prior to doses as [place]   Procedures/Studies: Dg Chest 2 View  Result Date: 09/07/2016 CLINICAL DATA:  Acute onset of central chest pain, radiating to the back. Initial encounter. EXAM: CHEST  2 VIEW COMPARISON:  Chest radiograph performed 07/27/2016 FINDINGS: The lungs are well-aerated. Vascular congestion is noted. Bibasilar airspace opacities may reflect mild interstitial edema. Small bilateral pleural effusions are seen. No pneumothorax is seen. The heart is borderline enlarged. The patient is  status post median sternotomy, with evidence of prior CABG. A right-sided dual-lumen catheter is noted ending about the cavoatrial junction. No acute osseous abnormalities are seen. Scattered calcification is seen along the abdominal aorta. IMPRESSION: 1. Vascular congestion and borderline cardiomegaly. Bibasilar airspace opacities may reflect mild interstitial edema. Small bilateral pleural effusions seen. 2. Scattered aortic atherosclerosis. Electronically  Signed   By: Garald Balding M.D.   On: 09/07/2016 02:36     Subjective: Patient reports feeling much better, no chest pain or shortness of breath.  Discharge Exam: Vitals:   09/08/16 0623 09/08/16 1036  BP: (!) 149/60 (!) 163/71  Pulse: 72 75  Resp: 16   Temp: 98.5 F (36.9 C)    Vitals:   09/07/16 2235 09/07/16 2309 09/08/16 0623 09/08/16 1036  BP: (!) 141/50 127/70 (!) 149/60 (!) 163/71  Pulse:  72 72 75  Resp:   16   Temp:   98.5 F (36.9 C)   TempSrc:   Oral   SpO2:   100%   Weight:      Height:       General: Pt is alert, awake, not in acute distress Cardiovascular:  S1/S2 +, no rubs, no gallops, port left chest wall Respiratory: CTA bilaterally, no wheezing, no rhonchi Abdominal: Soft, NT, ND, bowel sounds + Extremities:  no cyanosis  The results of significant diagnostics from this hospitalization (including imaging, microbiology, ancillary and laboratory) are listed below for reference.     Microbiology: No results found for this or any previous visit (from the past 240 hour(s)).   Labs: BNP (last 3 results)  Recent Labs  07/16/16 1517  BNP 767.2*   Basic Metabolic Panel:  Recent Labs Lab 09/06/16 0645 09/07/16 0135  NA 130* 129*  K 4.9 5.0  CL  --  91*  CO2  --  21*  GLUCOSE 110* 156*  BUN  --  62*  CREATININE  --  9.48*  CALCIUM  --  8.2*   Liver Function Tests:  Recent Labs Lab 09/07/16 0135  AST 16  ALT 9*  ALKPHOS 116  BILITOT 0.6  PROT 7.5  ALBUMIN 3.8   No results for  input(s): LIPASE, AMYLASE in the last 168 hours. No results for input(s): AMMONIA in the last 168 hours. CBC:  Recent Labs Lab 09/06/16 0645 09/07/16 0135  WBC  --  11.0*  NEUTROABS  --  8.6*  HGB 11.2* 13.3  HCT 33.0* 40.7  MCV  --  96.0  PLT  --  320   Cardiac Enzymes:  Recent Labs Lab 09/07/16 0111 09/07/16 0340 09/07/16 0620 09/07/16 0952  TROPONINI 0.03* 0.07* 0.14* 0.21*   BNP: Invalid input(s): POCBNP CBG: No results for input(s): GLUCAP in the last 168 hours. D-Dimer No results for input(s): DDIMER in the last 72 hours. Hgb A1c No results for input(s): HGBA1C in the last 72 hours. Lipid Profile No results for input(s): CHOL, HDL, LDLCALC, TRIG, CHOLHDL, LDLDIRECT in the last 72 hours. Thyroid function studies No results for input(s): TSH, T4TOTAL, T3FREE, THYROIDAB in the last 72 hours.  Invalid input(s): FREET3 Anemia work up No results for input(s): VITAMINB12, FOLATE, FERRITIN, TIBC, IRON, RETICCTPCT in the last 72 hours. Urinalysis    Component Value Date/Time   COLORURINE YELLOW 12/16/2012 1919   APPEARANCEUR CLOUDY (A) 12/16/2012 1919   LABSPEC 1.009 12/16/2012 1919   PHURINE 7.5 12/16/2012 1919   GLUCOSEU NEGATIVE 12/16/2012 1919   HGBUR TRACE (A) 12/16/2012 1919   BILIRUBINUR NEGATIVE 12/16/2012 1919   KETONESUR NEGATIVE 12/16/2012 1919   PROTEINUR 100 (A) 12/16/2012 1919   UROBILINOGEN 0.2 12/16/2012 1919   NITRITE NEGATIVE 12/16/2012 1919   LEUKOCYTESUR SMALL (A) 12/16/2012 1919   Sepsis Labs Invalid input(s): PROCALCITONIN,  WBC,  LACTICIDVEN Microbiology No results found for this or any previous visit (from the past 240 hour(s)).  Time coordinating discharge: 32 minutes  SIGNED:  Irwin Brakeman, MD  [facility] 09/08/2016, 1:27 PM Pager   If 7PM-7AM, please contact night-coverage [email or web address] Password [email or web address]

## 2016-09-08 NOTE — Discharge Instructions (Signed)
Go to regularly scheduled hemodialysis tomorrow. Return if symptoms recur worsen or new problems develop. Follow-up with vascular surgery as scheduled.

## 2016-09-08 NOTE — Progress Notes (Signed)
Pt IV removed, WNL. D/C instructions given to pt and spouse. Spouse here to take pt home.

## 2016-09-08 NOTE — Care Management Note (Signed)
Case Management Note  Patient Details  Name: Shawna [name] MRN: [id number] Date of Birth: 18-Feb-1941  Expected Discharge Date:      09/08/2016            Expected Discharge Plan:  [place]  In-House Referral:  NA  Discharge planning Services  CM Consult  Post Acute Care Choice:  NA Choice offered to:  NA  HH Arranged:  RN [facility] Agency:  [facility]  Status of Service:  Completed, signed off   Additional Comments: Pt discharging home today with resumption of [place] services through [facility]. [facility] agency aware of DC today. Pt observation and pt does not need order to resume [address] services. DC summary will be faxed to [facility] when available.   Sherald Barge, RN 09/08/2016, 1:00 PM

## 2016-09-09 DIAGNOSIS — Z992 Dependence on renal dialysis: Secondary | ICD-10-CM | POA: Diagnosis not present

## 2016-09-09 DIAGNOSIS — N186 End stage renal disease: Secondary | ICD-10-CM | POA: Diagnosis not present

## 2016-09-11 ENCOUNTER — Other Ambulatory Visit: Payer: Self-pay | Admitting: Cardiovascular Disease

## 2016-09-12 DIAGNOSIS — N186 End stage renal disease: Secondary | ICD-10-CM | POA: Diagnosis not present

## 2016-09-12 DIAGNOSIS — Z992 Dependence on renal dialysis: Secondary | ICD-10-CM | POA: Diagnosis not present

## 2016-09-14 DIAGNOSIS — N186 End stage renal disease: Secondary | ICD-10-CM | POA: Diagnosis not present

## 2016-09-14 DIAGNOSIS — I214 Non-ST elevation (NSTEMI) myocardial infarction: Secondary | ICD-10-CM

## 2016-09-14 DIAGNOSIS — Z992 Dependence on renal dialysis: Secondary | ICD-10-CM | POA: Diagnosis not present

## 2016-09-16 ENCOUNTER — Encounter: Payer: Self-pay | Admitting: Vascular Surgery

## 2016-09-16 DIAGNOSIS — Z992 Dependence on renal dialysis: Secondary | ICD-10-CM | POA: Diagnosis not present

## 2016-09-16 DIAGNOSIS — N186 End stage renal disease: Secondary | ICD-10-CM | POA: Diagnosis not present

## 2016-09-17 DIAGNOSIS — D631 Anemia in chronic kidney disease: Secondary | ICD-10-CM | POA: Diagnosis not present

## 2016-09-17 DIAGNOSIS — M6281 Muscle weakness (generalized): Secondary | ICD-10-CM | POA: Diagnosis not present

## 2016-09-17 DIAGNOSIS — I2510 Atherosclerotic heart disease of native coronary artery without angina pectoris: Secondary | ICD-10-CM | POA: Diagnosis not present

## 2016-09-17 DIAGNOSIS — I10 Essential (primary) hypertension: Secondary | ICD-10-CM | POA: Diagnosis not present

## 2016-09-19 ENCOUNTER — Emergency Department (HOSPITAL_COMMUNITY): Payer: Medicare HMO

## 2016-09-19 ENCOUNTER — Encounter (HOSPITAL_COMMUNITY): Payer: Self-pay | Admitting: Emergency Medicine

## 2016-09-19 ENCOUNTER — Inpatient Hospital Stay (HOSPITAL_COMMUNITY)
Admission: EM | Admit: 2016-09-19 | Discharge: 2016-09-22 | DRG: 280 | Disposition: A | Discharge status: Home / Self Care | Payer: Medicare HMO | Attending: Internal Medicine | Admitting: Internal Medicine

## 2016-09-19 DIAGNOSIS — T82855A Stenosis of coronary artery stent, initial encounter: Secondary | ICD-10-CM | POA: Diagnosis not present

## 2016-09-19 DIAGNOSIS — Z951 Presence of aortocoronary bypass graft: Secondary | ICD-10-CM | POA: Diagnosis not present

## 2016-09-19 DIAGNOSIS — Z880 Allergy status to penicillin: Secondary | ICD-10-CM

## 2016-09-19 DIAGNOSIS — J42 Unspecified chronic bronchitis: Secondary | ICD-10-CM | POA: Diagnosis present

## 2016-09-19 DIAGNOSIS — Z992 Dependence on renal dialysis: Secondary | ICD-10-CM | POA: Diagnosis not present

## 2016-09-19 DIAGNOSIS — D631 Anemia in chronic kidney disease: Secondary | ICD-10-CM | POA: Diagnosis present

## 2016-09-19 DIAGNOSIS — I200 Unstable angina: Secondary | ICD-10-CM | POA: Diagnosis present

## 2016-09-19 DIAGNOSIS — Z885 Allergy status to narcotic agent status: Secondary | ICD-10-CM

## 2016-09-19 DIAGNOSIS — Z886 Allergy status to analgesic agent status: Secondary | ICD-10-CM | POA: Diagnosis not present

## 2016-09-19 DIAGNOSIS — Z888 Allergy status to other drugs, medicaments and biological substances status: Secondary | ICD-10-CM

## 2016-09-19 DIAGNOSIS — K219 Gastro-esophageal reflux disease without esophagitis: Secondary | ICD-10-CM | POA: Diagnosis present

## 2016-09-19 DIAGNOSIS — Y831 Surgical operation with implant of artificial internal device as the cause of abnormal reaction of the patient, or of later complication, without mention of misadventure at the time of the procedure: Secondary | ICD-10-CM | POA: Diagnosis not present

## 2016-09-19 DIAGNOSIS — Z8673 Personal history of transient ischemic attack (TIA), and cerebral infarction without residual deficits: Secondary | ICD-10-CM

## 2016-09-19 DIAGNOSIS — I161 Hypertensive emergency: Secondary | ICD-10-CM | POA: Diagnosis present

## 2016-09-19 DIAGNOSIS — Z91041 Radiographic dye allergy status: Secondary | ICD-10-CM

## 2016-09-19 DIAGNOSIS — E785 Hyperlipidemia, unspecified: Secondary | ICD-10-CM | POA: Diagnosis present

## 2016-09-19 DIAGNOSIS — K5520 Angiodysplasia of colon without hemorrhage: Secondary | ICD-10-CM | POA: Diagnosis present

## 2016-09-19 DIAGNOSIS — Z881 Allergy status to other antibiotic agents status: Secondary | ICD-10-CM

## 2016-09-19 DIAGNOSIS — Z79899 Other long term (current) drug therapy: Secondary | ICD-10-CM

## 2016-09-19 DIAGNOSIS — I2510 Atherosclerotic heart disease of native coronary artery without angina pectoris: Secondary | ICD-10-CM | POA: Diagnosis not present

## 2016-09-19 DIAGNOSIS — I5032 Chronic diastolic (congestive) heart failure: Secondary | ICD-10-CM | POA: Diagnosis present

## 2016-09-19 DIAGNOSIS — I340 Nonrheumatic mitral (valve) insufficiency: Secondary | ICD-10-CM | POA: Diagnosis present

## 2016-09-19 DIAGNOSIS — E875 Hyperkalemia: Secondary | ICD-10-CM | POA: Diagnosis present

## 2016-09-19 DIAGNOSIS — E7800 Pure hypercholesterolemia, unspecified: Secondary | ICD-10-CM

## 2016-09-19 DIAGNOSIS — Z8249 Family history of ischemic heart disease and other diseases of the circulatory system: Secondary | ICD-10-CM

## 2016-09-19 DIAGNOSIS — I214 Non-ST elevation (NSTEMI) myocardial infarction: Secondary | ICD-10-CM | POA: Diagnosis not present

## 2016-09-19 DIAGNOSIS — I21A1 Myocardial infarction type 2: Secondary | ICD-10-CM | POA: Diagnosis present

## 2016-09-19 DIAGNOSIS — I25110 Atherosclerotic heart disease of native coronary artery with unstable angina pectoris: Secondary | ICD-10-CM | POA: Diagnosis not present

## 2016-09-19 DIAGNOSIS — N2889 Other specified disorders of kidney and ureter: Secondary | ICD-10-CM

## 2016-09-19 DIAGNOSIS — Z8711 Personal history of peptic ulcer disease: Secondary | ICD-10-CM | POA: Diagnosis not present

## 2016-09-19 DIAGNOSIS — E8889 Other specified metabolic disorders: Secondary | ICD-10-CM | POA: Diagnosis not present

## 2016-09-19 DIAGNOSIS — Z882 Allergy status to sulfonamides status: Secondary | ICD-10-CM

## 2016-09-19 DIAGNOSIS — E782 Mixed hyperlipidemia: Secondary | ICD-10-CM | POA: Diagnosis present

## 2016-09-19 DIAGNOSIS — R079 Chest pain, unspecified: Secondary | ICD-10-CM | POA: Diagnosis not present

## 2016-09-19 DIAGNOSIS — I5022 Chronic systolic (congestive) heart failure: Secondary | ICD-10-CM | POA: Diagnosis present

## 2016-09-19 DIAGNOSIS — N186 End stage renal disease: Secondary | ICD-10-CM

## 2016-09-19 DIAGNOSIS — I252 Old myocardial infarction: Secondary | ICD-10-CM | POA: Diagnosis not present

## 2016-09-19 DIAGNOSIS — I151 Hypertension secondary to other renal disorders: Secondary | ICD-10-CM | POA: Diagnosis present

## 2016-09-19 DIAGNOSIS — I5042 Chronic combined systolic (congestive) and diastolic (congestive) heart failure: Secondary | ICD-10-CM | POA: Diagnosis present

## 2016-09-19 DIAGNOSIS — I248 Other forms of acute ischemic heart disease: Secondary | ICD-10-CM | POA: Diagnosis present

## 2016-09-19 DIAGNOSIS — Z7982 Long term (current) use of aspirin: Secondary | ICD-10-CM

## 2016-09-19 DIAGNOSIS — I2489 Other forms of acute ischemic heart disease: Secondary | ICD-10-CM | POA: Diagnosis present

## 2016-09-19 DIAGNOSIS — I132 Hypertensive heart and chronic kidney disease with heart failure and with stage 5 chronic kidney disease, or end stage renal disease: Principal | ICD-10-CM | POA: Diagnosis present

## 2016-09-19 DIAGNOSIS — I10 Essential (primary) hypertension: Secondary | ICD-10-CM | POA: Diagnosis not present

## 2016-09-19 DIAGNOSIS — I120 Hypertensive chronic kidney disease with stage 5 chronic kidney disease or end stage renal disease: Secondary | ICD-10-CM | POA: Diagnosis not present

## 2016-09-19 LAB — IRON AND TIBC
IRON: 62 ug/dL (ref 28–170)
Saturation Ratios: 27 % (ref 10.4–31.8)
TIBC: 230 ug/dL — AB (ref 250–450)
UIBC: 168 ug/dL

## 2016-09-19 LAB — CBC
HCT: 30.5 % — ABNORMAL LOW (ref 36.0–46.0)
HEMOGLOBIN: 9.8 g/dL — AB (ref 12.0–15.0)
MCH: 30.5 pg (ref 26.0–34.0)
MCHC: 32.1 g/dL (ref 30.0–36.0)
MCV: 95 fL (ref 78.0–100.0)
PLATELETS: 232 10*3/uL (ref 150–400)
RBC: 3.21 MIL/uL — AB (ref 3.87–5.11)
RDW: 16.1 % — ABNORMAL HIGH (ref 11.5–15.5)
WBC: 7.3 10*3/uL (ref 4.0–10.5)

## 2016-09-19 LAB — BASIC METABOLIC PANEL
ANION GAP: 17 — AB (ref 5–15)
BUN: 74 mg/dL — ABNORMAL HIGH (ref 6–20)
CALCIUM: 8.3 mg/dL — AB (ref 8.9–10.3)
CO2: 23 mmol/L (ref 22–32)
CREATININE: 10.57 mg/dL — AB (ref 0.44–1.00)
Chloride: 96 mmol/L — ABNORMAL LOW (ref 101–111)
GFR calc non Af Amer: 3 mL/min — ABNORMAL LOW (ref >60)
GFR, EST AFRICAN AMERICAN: 4 mL/min — AB (ref >60)
Glucose, Bld: 108 mg/dL — ABNORMAL HIGH (ref 65–99)
Potassium: 5.3 mmol/L — ABNORMAL HIGH (ref 3.5–5.1)
SODIUM: 136 mmol/L (ref 135–145)

## 2016-09-19 LAB — FERRITIN: FERRITIN: 1122 ng/mL — AB (ref 11–307)

## 2016-09-19 LAB — I-STAT TROPONIN, ED: TROPONIN I, POC: 0.02 ng/mL (ref 0.00–0.08)

## 2016-09-19 LAB — TROPONIN I: Troponin I: 0.07 ng/mL (ref <0.03)

## 2016-09-19 IMAGING — DX DG CHEST 2V
2 series · 2 of 2 positions shown · non-contrast
Comparison: [DATE].

CLINICAL DATA: Dialysis.  Chest pains.

EXAM:
CHEST  2 VIEW

[chest pa]
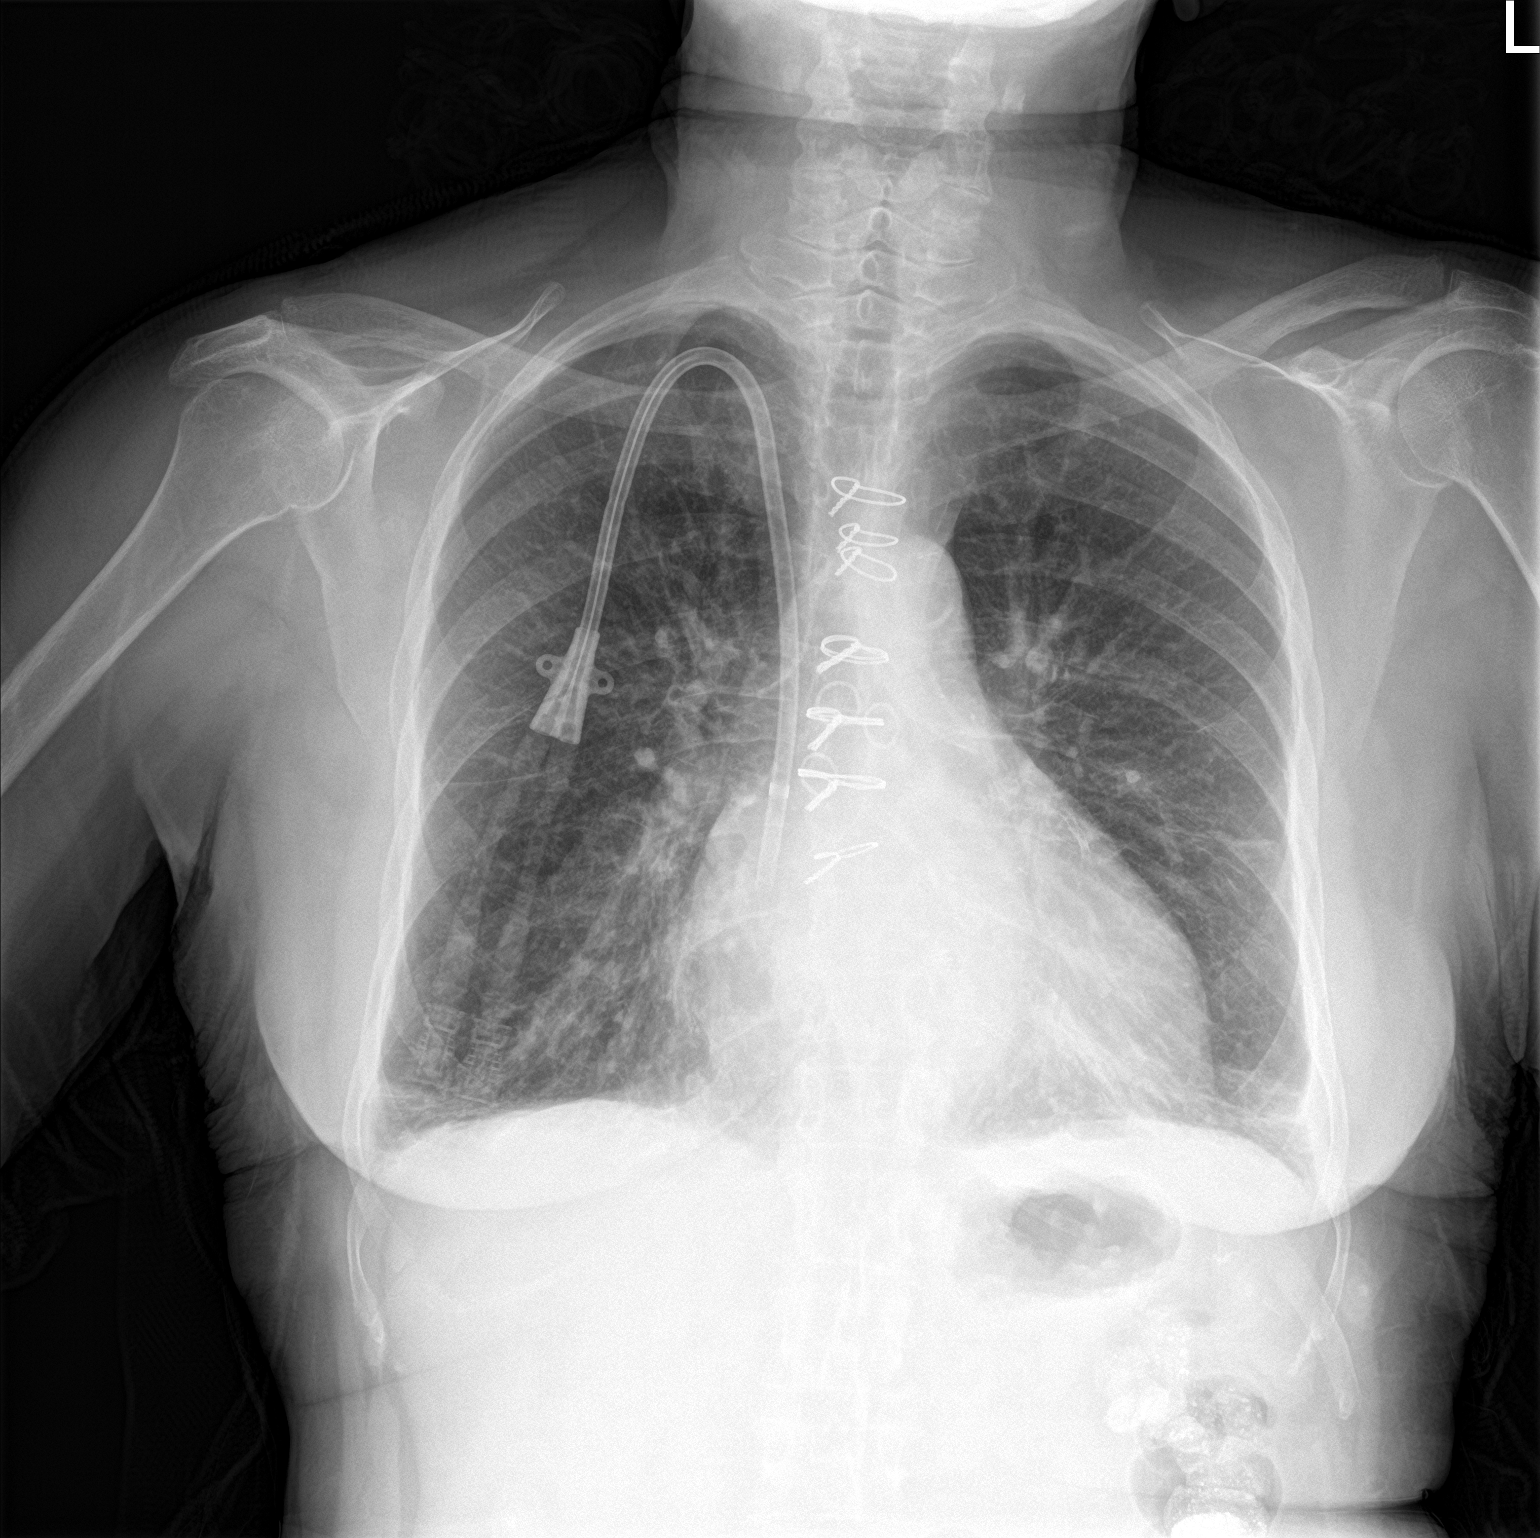

[chest lat]
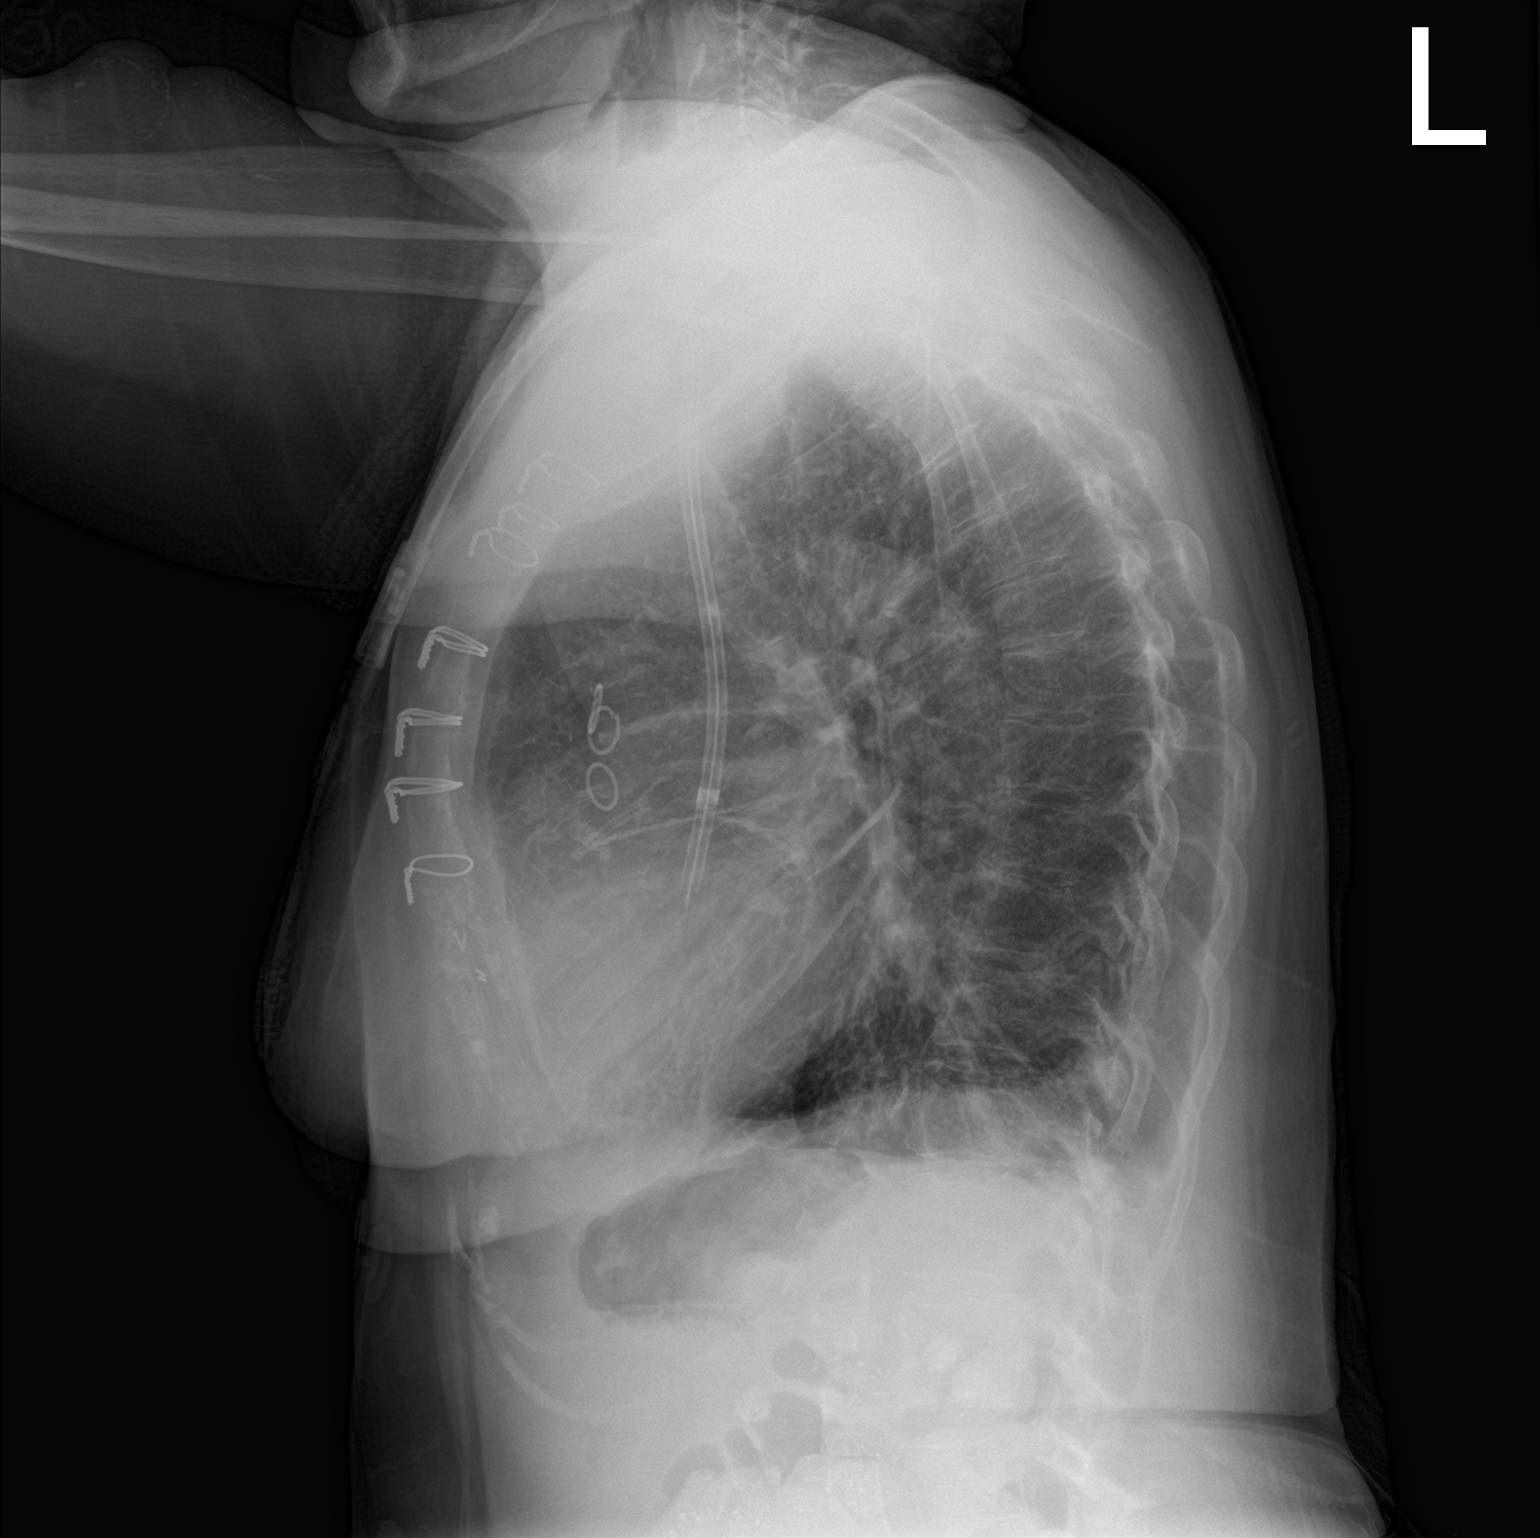

[2 of 2 positions shown; findings below may reference images not displayed]

FINDINGS: Dialysis catheter noted with its tip at the cavoatrial junction.
Prior CABG. Cardiomegaly. Near complete clearing of previously
identified mild interstitial prominence. Mild bibasilar subsegmental
atelectasis. Tiny bilateral pleural effusions. No pneumothorax.
IMPRESSION: 1. Dialysis catheter noted is stable position.

2. Cardiomegaly. Previously identified pulmonary interstitial edema
has almost completely cleared. Mild bibasilar subsegmental
atelectasis .

## 2016-09-19 MED ORDER — LIDOCAINE-PRILOCAINE 2.5-2.5 % EX CREA: 1.0000 "application " | TOPICAL_CREAM | CUTANEOUS | Status: DC | PRN

## 2016-09-19 MED ORDER — ASPIRIN 81 MG PO CHEW: 81.0000 mg | CHEWABLE_TABLET | ORAL | Status: DC

## 2016-09-19 MED ORDER — HEPARIN SODIUM (PORCINE) 1000 UNIT/ML DIALYSIS: 40.0000 [IU]/kg | INTRAMUSCULAR | Status: DC | PRN

## 2016-09-19 MED ORDER — SODIUM CHLORIDE 0.9 % IV SOLN: 100.0000 mL | INTRAVENOUS | Status: DC | PRN

## 2016-09-19 MED ORDER — SIMVASTATIN 20 MG PO TABS
20.0000 mg | ORAL_TABLET | Freq: Every day | ORAL | Status: DC
  Administered 2016-09-19 – 2016-09-20 (×2): 20 mg via ORAL
  Filled 2016-09-19 (×2): qty 1

## 2016-09-19 MED ORDER — DOXYCYCLINE HYCLATE 100 MG PO TABS
100.0000 mg | ORAL_TABLET | Freq: Two times a day (BID) | ORAL | Status: DC
  Administered 2016-09-19 – 2016-09-22 (×6): 100 mg via ORAL
  Filled 2016-09-19 (×6): qty 1

## 2016-09-19 MED ORDER — SODIUM CHLORIDE 0.9% FLUSH
3.0000 mL | Freq: Two times a day (BID) | INTRAVENOUS | Status: DC
  Administered 2016-09-19: 3 mL via INTRAVENOUS

## 2016-09-19 MED ORDER — SODIUM CHLORIDE 0.9% FLUSH: 3.0000 mL | INTRAVENOUS | Status: DC | PRN

## 2016-09-19 MED ORDER — PENTAFLUOROPROP-TETRAFLUOROETH EX AERO: 1.0000 "application " | INHALATION_SPRAY | CUTANEOUS | Status: DC | PRN

## 2016-09-19 MED ORDER — ALTEPLASE 2 MG IJ SOLR: 2.0000 mg | Freq: Once | INTRAMUSCULAR | Status: DC | PRN

## 2016-09-19 MED ORDER — FAMOTIDINE IN NACL 20-0.9 MG/50ML-% IV SOLN
20.0000 mg | INTRAVENOUS | Status: AC
  Administered 2016-09-20: 20 mg via INTRAVENOUS
  Filled 2016-09-19: qty 50

## 2016-09-19 MED ORDER — NITROGLYCERIN 0.4 MG SL SUBL
0.4000 mg | SUBLINGUAL_TABLET | Freq: Once | SUBLINGUAL | Status: AC
  Administered 2016-09-19: 0.4 mg via SUBLINGUAL

## 2016-09-19 MED ORDER — METHYLPREDNISOLONE SODIUM SUCC 125 MG IJ SOLR
125.0000 mg | INTRAMUSCULAR | Status: AC
  Administered 2016-09-20: 125 mg via INTRAVENOUS
  Filled 2016-09-19: qty 2

## 2016-09-19 MED ORDER — LANTHANUM CARBONATE 500 MG PO CHEW
1000.0000 mg | CHEWABLE_TABLET | Freq: Three times a day (TID) | ORAL | Status: DC
  Administered 2016-09-20 – 2016-09-22 (×5): 1000 mg via ORAL
  Filled 2016-09-19 (×5): qty 2

## 2016-09-19 MED ORDER — OXYCODONE-ACETAMINOPHEN 5-325 MG PO TABS
1.0000 | ORAL_TABLET | Freq: Four times a day (QID) | ORAL | Status: DC | PRN
  Administered 2016-09-21: 1 via ORAL

## 2016-09-19 MED ORDER — RENA-VITE PO TABS
1.0000 | ORAL_TABLET | Freq: Every evening | ORAL | Status: DC
  Administered 2016-09-20 – 2016-09-21 (×2): 1 via ORAL
  Filled 2016-09-19 (×2): qty 1

## 2016-09-19 MED ORDER — DIPHENHYDRAMINE HCL 50 MG/ML IJ SOLN
25.0000 mg | INTRAMUSCULAR | Status: AC
  Administered 2016-09-20: 25 mg via INTRAVENOUS
  Filled 2016-09-19: qty 1

## 2016-09-19 MED ORDER — ALBUTEROL SULFATE (2.5 MG/3ML) 0.083% IN NEBU: 3.0000 mL | INHALATION_SOLUTION | Freq: Four times a day (QID) | RESPIRATORY_TRACT | Status: DC | PRN

## 2016-09-19 MED ORDER — EPOETIN ALFA 10000 UNIT/ML IJ SOLN: 10000.0000 [IU] | INTRAMUSCULAR | Status: DC

## 2016-09-19 MED ORDER — LIDOCAINE HCL (PF) 1 % IJ SOLN: 5.0000 mL | INTRAMUSCULAR | Status: DC | PRN

## 2016-09-19 MED ORDER — DARBEPOETIN ALFA 150 MCG/0.3ML IJ SOSY
150.0000 ug | PREFILLED_SYRINGE | INTRAMUSCULAR | Status: DC
  Administered 2016-09-19: 150 ug via INTRAVENOUS

## 2016-09-19 MED ORDER — HYDRALAZINE HCL 20 MG/ML IJ SOLN
10.0000 mg | Freq: Once | INTRAMUSCULAR | Status: AC
  Administered 2016-09-19: 10 mg via INTRAVENOUS
  Filled 2016-09-19: qty 1

## 2016-09-19 MED ORDER — SODIUM CHLORIDE 0.9 % IV SOLN
INTRAVENOUS | Status: DC
  Administered 2016-09-20: 06:00:00 via INTRAVENOUS

## 2016-09-19 MED ORDER — SODIUM CHLORIDE 0.9% FLUSH
3.0000 mL | Freq: Two times a day (BID) | INTRAVENOUS | Status: DC
  Administered 2016-09-22: 3 mL via INTRAVENOUS

## 2016-09-19 MED ORDER — CLONIDINE HCL 0.2 MG PO TABS
0.2000 mg | ORAL_TABLET | Freq: Three times a day (TID) | ORAL | Status: DC
  Administered 2016-09-19 – 2016-09-21 (×6): 0.2 mg via ORAL
  Filled 2016-09-19 (×6): qty 1

## 2016-09-19 MED ORDER — NITROGLYCERIN 0.4 MG SL SUBL
0.4000 mg | SUBLINGUAL_TABLET | SUBLINGUAL | Status: DC | PRN
  Administered 2016-09-19 – 2016-09-21 (×2): 0.4 mg via SUBLINGUAL
  Filled 2016-09-19: qty 1

## 2016-09-19 MED ORDER — PANTOPRAZOLE SODIUM 40 MG PO TBEC
40.0000 mg | DELAYED_RELEASE_TABLET | Freq: Every day | ORAL | Status: DC
  Administered 2016-09-20 – 2016-09-22 (×3): 40 mg via ORAL
  Filled 2016-09-19 (×3): qty 1

## 2016-09-19 MED ORDER — SODIUM CHLORIDE 0.9 % IV SOLN: 250.0000 mL | INTRAVENOUS | Status: DC | PRN

## 2016-09-19 MED ORDER — HYDRALAZINE HCL 50 MG PO TABS
50.0000 mg | ORAL_TABLET | Freq: Three times a day (TID) | ORAL | Status: DC
  Administered 2016-09-19 – 2016-09-22 (×7): 50 mg via ORAL
  Filled 2016-09-19 (×9): qty 1

## 2016-09-19 MED ORDER — HEPARIN SODIUM (PORCINE) 1000 UNIT/ML DIALYSIS: 1000.0000 [IU] | INTRAMUSCULAR | Status: DC | PRN

## 2016-09-19 MED ORDER — ONDANSETRON HCL 4 MG/2ML IJ SOLN: 4.0000 mg | Freq: Four times a day (QID) | INTRAMUSCULAR | Status: DC | PRN

## 2016-09-19 MED ORDER — ALPRAZOLAM 0.25 MG PO TABS
0.2500 mg | ORAL_TABLET | Freq: Every evening | ORAL | Status: DC | PRN
  Administered 2016-09-19: 0.25 mg via ORAL
  Administered 2016-09-22: 0.5 mg via ORAL
  Filled 2016-09-19: qty 2
  Filled 2016-09-19: qty 1

## 2016-09-19 MED ORDER — HEPARIN BOLUS VIA INFUSION
3500.0000 [IU] | Freq: Once | INTRAVENOUS | Status: DC
  Filled 2016-09-19: qty 3500

## 2016-09-19 MED ORDER — ASPIRIN EC 81 MG PO TBEC
81.0000 mg | DELAYED_RELEASE_TABLET | Freq: Every day | ORAL | Status: DC
  Administered 2016-09-20 – 2016-09-22 (×3): 81 mg via ORAL
  Filled 2016-09-19 (×3): qty 1

## 2016-09-19 MED ORDER — CINACALCET HCL 30 MG PO TABS
30.0000 mg | ORAL_TABLET | Freq: Every day | ORAL | Status: DC
  Administered 2016-09-20: 30 mg via ORAL
  Filled 2016-09-19 (×2): qty 1

## 2016-09-19 MED ORDER — AMIODARONE HCL 200 MG PO TABS
200.0000 mg | ORAL_TABLET | Freq: Every day | ORAL | Status: DC
  Administered 2016-09-20 – 2016-09-22 (×3): 200 mg via ORAL
  Filled 2016-09-19 (×3): qty 1

## 2016-09-19 MED ORDER — HEPARIN (PORCINE) IN NACL 100-0.45 UNIT/ML-% IJ SOLN
800.0000 [IU]/h | INTRAMUSCULAR | Status: DC
  Administered 2016-09-19: 700 [IU]/h via INTRAVENOUS
  Filled 2016-09-19 (×2): qty 250

## 2016-09-19 MED ORDER — NITROGLYCERIN 0.4 MG SL SUBL
SUBLINGUAL_TABLET | SUBLINGUAL | Status: AC
Start: 2016-09-19 — End: 2016-09-19
  Filled 2016-09-19: qty 1

## 2016-09-19 MED ORDER — FLUTICASONE PROPIONATE 50 MCG/ACT NA SUSP: 2.0000 | Freq: Every day | NASAL | Status: DC | PRN

## 2016-09-19 MED ORDER — NITROGLYCERIN 0.4 MG SL SUBL
SUBLINGUAL_TABLET | SUBLINGUAL | Status: AC
  Filled 2016-09-19: qty 1

## 2016-09-19 MED ORDER — DARBEPOETIN ALFA 150 MCG/0.3ML IJ SOSY
PREFILLED_SYRINGE | INTRAMUSCULAR | Status: AC
  Filled 2016-09-19: qty 0.3

## 2016-09-19 MED ORDER — NITROGLYCERIN IN D5W 200-5 MCG/ML-% IV SOLN: 0.0000 ug/min | INTRAVENOUS | Status: DC

## 2016-09-19 NOTE — ED Provider Notes (Signed)
[facility] DEPT Provider Note   CSN: [id number] Arrival date & time: 09/19/16  1053     History   Chief Complaint Chief Complaint  Patient presents with  . Chest Pain    HPI Shawna [name] is a 76 y.o. female.  The history is provided by the patient.  Chest Pain   This is a recurrent problem. The current episode started 3 to 5 hours ago. The problem occurs constantly. The problem has been resolved. The pain is associated with exertion. The pain is present in the substernal region (all the way across her chest but worse in the middle). The pain is at a severity of 9/10. The pain is severe. The quality of the pain is described as exertional, pressure-like and heavy. The pain radiates to the mid back (between the shoulder blades). Duration of episode(s) is 2 hours. Associated symptoms include shortness of breath and weakness. Pertinent negatives include no abdominal pain, no fever, no leg pain, no lower extremity edema, no nausea, no syncope and no vomiting. She has tried nitroglycerin for the symptoms. The treatment provided significant relief. Risk factors include being elderly.  Her past medical history is significant for CAD, CHF, hyperlipidemia and hypertension. Past medical history comments: esrd on dialysis  Procedure history is positive for cardiac catheterization and stress echo.    Past Medical History:  Diagnosis Date  . Anxiety   . Arthritis   . AVM (arteriovenous malformation) of colon   . CAD (coronary artery disease)    a. 12/2011 NSTEMI/Cath/PCI LCX (2.25x14 Resolute DES) & D1 (2.25x22 Resolute DES);  b. 01/2012 Cath/PCI: LM 30, LAD 30p, 40-54m, D1 stent ok, 99 in sm branch of diag, LCX patent stent, OM1 20, RCA 95 ost (4.0x12 Promus DES), EF 55%;  c. 04/2012 Lexi Cardiolite  EF 48%, small area of scar @ base/mid inflat wall with mild peri-infarct ischemia.; CABG 12/4  . Carotid artery disease ([place])    a. 52-77% LICA, 02/2422   . Chronic bronchitis ([place])   . Chronic  diastolic CHF (congestive heart failure) ([place])    a. 02/2012 Echo EF 60-65%, nl wall motion, Gr 1 DD, mod MR  . Colon cancer ([place]) 1992  . Esophageal stricture   . ESRD on hemodialysis ([place])    ESRD due to HTN, started dialysis 2011 and gets HD at [facility] with Dr Hinda Lenis on MWF schedule.  Access is LUA AVF as of Sept 2014.   Marland Kitchen GERD (gastroesophageal reflux disease)   . High cholesterol 12/2011  . History of blood transfusion 07/2011; 12/2011; 01/2012 X 2; 04/2012  . History of gout   . History of lower GI bleeding   . Hypertension   . Iron deficiency anemia   . Mitral regurgitation    a. Moderate by echo, 02/2012  . Myocardial infarction   . Ovarian cancer ([place]) 1992  . Pneumonia ~ 2009  . PUD (peptic ulcer disease)   . TIA (transient ischemic attack)     Patient Active Problem List   Diagnosis Date Noted  . NSTEMI (non-ST elevated myocardial infarction) ([place])   . Encounter for fitting and adjustment of vascular catheter   . End stage renal disease ([place])   . ESRD (end stage renal disease) ([place])   . Heme positive stool   . Demand ischemia ([place]) 07/27/2016  . Hypertensive emergency 07/08/2016  . Respiratory failure ([place])   . Cardiac arrest ([place])   . Palliative care encounter   . DNR (do not resuscitate)  discussion   . Goals of care, counseling/discussion   . Hypertensive crisis without congestive heart failure 05/09/2016  . Acute pulmonary edema ([place]) 04/06/2016  . Acute respiratory failure ([place]) 04/06/2016  . Hypertensive crisis 01/27/2016  . History of colon cancer 01/27/2016  . History of ovarian cancer 01/27/2016  . Hypertensive urgency 01/27/2016  . Narrow complex tachycardia ([place]) 09/08/2015  . SVT (supraventricular tachycardia) ([place]) 09/08/2015  . Influenza A 08/30/2015  . Acute on chronic diastolic CHF (congestive heart failure) ([place]) 05/04/2015  . Unstable angina ([place]) 05/03/2015  . Essential hypertension   . Pain in joint, lower leg 08/14/2014  . Chest pain  11/26/2013  . Small bowel obstruction, partial 05/29/2013  . Chronic diastolic CHF (congestive heart failure) ([place]) 03/22/2013  . GI bleeding 03/21/2013  . Acute blood loss anemia 03/21/2013  . Vaginal odor 03/12/2013  . Vaginal discharge 03/12/2013  . Occlusion and stenosis of carotid artery without mention of cerebral infarction 01/24/2013  . Hx of CABG 07/05/2012  . Carotid artery disease ([place]) 07/05/2012  . Anemia of chronic kidney failure 07/03/2012  . Secondary hyperparathyroidism ([place]) 07/03/2012  . Mitral regurgitation 06/12/2012  . Pneumonia 06/09/2012  . Non-STEMI (non-ST elevated myocardial infarction) ([place]) 06/08/2012  . AVM (arteriovenous malformation) of small bowel, acquired ([place]) 01/20/2012  . GERD (gastroesophageal reflux disease) 01/09/2012  . HLD (hyperlipidemia) 01/05/2012  . Coronary atherosclerosis of native coronary artery 12/16/2011  . ESRD on hemodialysis ([place]) 12/16/2011    Past Surgical History:  Procedure Laterality Date  . ABDOMINAL HYSTERECTOMY  1992  . APPENDECTOMY  06/1990  . AV FISTULA PLACEMENT  07/2009   left upper arm  . AV FISTULA PLACEMENT Right 09/06/2016   Procedure: RIGHT FOREARM ARTERIOVENOUS (AV) GRAFT;  Surgeon: Elam Dutch, MD;  Location: [facility];  Service: Vascular;  Laterality: Right;  . [place] REMOVAL Right 09/06/2016   Procedure: REMOVAL OF Right Arm ARTERIOVENOUS GORETEX GRAFT and Vein Patch angioplasty of brachial artery;  Surgeon: Angelia Mould, MD;  Location: [facility];  Service: Vascular;  Laterality: Right;  . COLON RESECTION  1992  . CORONARY ANGIOPLASTY WITH STENT PLACEMENT  12/15/11   "2"  . CORONARY ANGIOPLASTY WITH STENT PLACEMENT  y/2013   "1; makes total of 3" (05/02/2012)  . CORONARY ARTERY BYPASS GRAFT  06/13/2012   Procedure: CORONARY ARTERY BYPASS GRAFTING (CABG);  Surgeon: Grace Isaac, MD;  Location: [facility];  Service: Open Heart Surgery;  Laterality: N/A;  cabg x four;  using left internal mammary artery, and  left leg greater saphenous vein harvested endoscopically  . DILATION AND CURETTAGE OF UTERUS    . ESOPHAGOGASTRODUODENOSCOPY  01/20/2012   Procedure: ESOPHAGOGASTRODUODENOSCOPY (EGD);  Surgeon: Ladene Artist, MD,FACG;  Location: [facility] ENDOSCOPY;  Service: Endoscopy;  Laterality: N/A;  . ESOPHAGOGASTRODUODENOSCOPY N/A 03/26/2013   Procedure: ESOPHAGOGASTRODUODENOSCOPY (EGD);  Surgeon: Irene Shipper, MD;  Location: [facility] ENDOSCOPY;  Service: Endoscopy;  Laterality: N/A;  . ESOPHAGOGASTRODUODENOSCOPY N/A 04/30/2015   Procedure: ESOPHAGOGASTRODUODENOSCOPY (EGD);  Surgeon: Rogene Houston, MD;  [facility];  Service: Endoscopy;  Laterality: N/A;  1pm - moved to 10/20 @ 1:10  . ESOPHAGOGASTRODUODENOSCOPY N/A 07/29/2016   Procedure: ESOPHAGOGASTRODUODENOSCOPY (EGD);  Surgeon: Manus Gunning, MD;  Location: [facility];  Service: Gastroenterology;  Laterality: N/A;  enteroscopy  . INTRAOPERATIVE TRANSESOPHAGEAL ECHOCARDIOGRAM  06/13/2012   Procedure: INTRAOPERATIVE TRANSESOPHAGEAL ECHOCARDIOGRAM;  Surgeon: Grace Isaac, MD;  Location: [facility];  Service: Open Heart Surgery;  Laterality: N/A;  . IR GENERIC HISTORICAL  07/26/2016   IR FLUORO GUIDE CV LINE RIGHT 07/26/2016 Greggory Keen, MD [facility]  . IR GENERIC HISTORICAL  07/26/2016   IR US GUIDE VASC ACCESS RIGHT 07/26/2016 Greggory Keen, MD [facility]  . IR GENERIC HISTORICAL  08/02/2016   IR US GUIDE VASC ACCESS RIGHT 08/02/2016 Greggory Keen, MD [facility]  . IR GENERIC HISTORICAL  08/02/2016   IR FLUORO GUIDE CV LINE RIGHT 08/02/2016 Greggory Keen, MD [facility]  . LEFT HEART CATHETERIZATION WITH CORONARY ANGIOGRAM N/A 12/15/2011   Procedure: LEFT HEART CATHETERIZATION WITH CORONARY ANGIOGRAM;  Surgeon: Burnell Blanks, MD;  Location: [facility] CATH LAB;  Service: Cardiovascular;  Laterality: N/A;  . LEFT HEART CATHETERIZATION WITH CORONARY ANGIOGRAM N/A 01/10/2012   Procedure: LEFT HEART CATHETERIZATION WITH CORONARY ANGIOGRAM;   Surgeon: Peter M Martinique, MD;  Location: [facility] CATH LAB;  Service: Cardiovascular;  Laterality: N/A;  . LEFT HEART CATHETERIZATION WITH CORONARY ANGIOGRAM N/A 06/08/2012   Procedure: LEFT HEART CATHETERIZATION WITH CORONARY ANGIOGRAM;  Surgeon: Burnell Blanks, MD;  Location: [facility] CATH LAB;  Service: Cardiovascular;  Laterality: N/A;  . LEFT HEART CATHETERIZATION WITH CORONARY/GRAFT ANGIOGRAM N/A 12/10/2013   Procedure: LEFT HEART CATHETERIZATION WITH Beatrix Fetters;  Surgeon: [name], MD;  Location: [facility] CATH LAB;  Service: Cardiovascular;  Laterality: N/A;  . OVARY SURGERY     ovarian cancer  . SHUNTOGRAM N/A 10/15/2013   Procedure: Fistulogram;  Surgeon: Serafina Mitchell, MD;  Location: [facility] CATH LAB;  Service: Cardiovascular;  Laterality: N/A;  . THROMBECTOMY / ARTERIOVENOUS GRAFT REVISION  2011   left upper arm  . TUBAL LIGATION  1980's    OB History    Gravida Para Term Preterm AB Living   2 2   2   2    SAB TAB Ectopic Multiple Live Births                   Home Medications    Prior to Admission medications   Medication Sig Start Date End Date Taking? Authorizing Provider  ALPRAZolam (XANAX) 0.25 MG tablet Take 0.25-0.5 mg by mouth at bedtime as needed for sleep.  11/19/15   Historical Provider, MD  amiodarone (PACERONE) 200 MG tablet Take 1 tablet (200 mg total) by mouth daily. 08/19/16   Herminio Commons, MD  aspirin EC 81 MG tablet Take 81 mg by mouth daily.     Historical Provider, MD  cloNIDine (CATAPRES) 0.2 MG tablet Take 1 tablet (0.2 mg total) by mouth 3 (three) times daily. 09/02/16   Herminio Commons, MD  doxycycline (VIBRAMYCIN) 100 MG capsule Take 1 capsule (100 mg total) by mouth 2 (two) times daily. 09/08/16   Clanford Marisa Hua, MD  epoetin alfa (EPOGEN,PROCRIT) 93818 UNIT/ML injection Inject 1 mL (10,000 Units total) into the vein every Monday, Wednesday, and Friday with hemodialysis. 04/11/16   Orvan Falconer, MD  fluticasone (FLONASE) 50 MCG/ACT nasal  spray Place 2 sprays into the nose daily as needed for allergies.     Historical Provider, MD  hydrALAZINE (APRESOLINE) 50 MG tablet Take 1 tablet (50 mg total) by mouth every 8 (eight) hours. 07/10/16   Thurnell Lose, MD  isosorbide mononitrate (IMDUR) 30 MG 24 hr tablet Take 3 tablets (90 mg total) by mouth daily. 08/03/16   Kinnie Feil, MD  lanthanum (FOSRENOL) 1000 MG chewable tablet Chew 1 tablet (1,000 mg total) by mouth 3 (three) times daily with meals. 04/08/16   Orvan Falconer, MD  multivitamin (RENA-VIT) TABS  tablet Take 1 tablet by mouth daily.    Historical Provider, MD  nitroGLYCERIN (NITROSTAT) 0.4 MG SL tablet Place 1 tablet (0.4 mg total) under the tongue every 5 (five) minutes as needed for chest pain. 08/02/16   Kinnie Feil, MD  oxyCODONE-acetaminophen (PERCOCET/ROXICET) 5-325 MG tablet Take 1 tablet by mouth every 6 (six) hours as needed. 09/06/16   Alvia Grove, PA-C  pantoprazole (PROTONIX) 40 MG tablet Take 1 tablet (40 mg total) by mouth daily. 08/03/16   Kinnie Feil, MD  PROAIR HFA 108 (90 BASE) MCG/ACT inhaler Inhale 1 puff into the lungs every 6 (six) hours as needed for wheezing or shortness of breath.  01/09/15   Historical Provider, MD  SENSIPAR 30 MG tablet Take 30 mg by mouth daily with supper.  12/21/12   Historical Provider, MD  simvastatin (ZOCOR) 20 MG tablet Take 1 tablet (20 mg total) by mouth at bedtime. 04/04/16   Lendon Colonel, NP    Family History Family History  Problem Relation Age of Onset  . Heart disease Mother     Heart Disease before age 53  . Hyperlipidemia Mother   . Hypertension Mother   . Diabetes Mother   . Heart attack Mother   . Heart disease Father     Heart Disease before age 72  . Hyperlipidemia Father   . Hypertension Father   . Diabetes Father   . Diabetes Sister   . Hypertension Sister   . Diabetes Brother   . Hyperlipidemia Brother   . Heart attack Brother   . Hypertension Sister   . Heart attack Brother   .  Other      noncontributory for early CAD  . Colon cancer Neg Hx   . Esophageal cancer Neg Hx   . Liver disease Neg Hx   . Kidney disease Neg Hx   . Colon polyps Neg Hx     Social History Social History  Substance Use Topics  . Smoking status: Never Smoker  . Smokeless tobacco: Never Used  . Alcohol use No     Allergies   Aspirin; Penicillins; Amlodipine; Bactrim [sulfamethoxazole-trimethoprim]; Contrast media [iodinated diagnostic agents]; Iron; Nitrofurantoin; Prilosec [omeprazole]; Tylenol [acetaminophen]; Dexilant [dexlansoprazole]; Levaquin [levofloxacin in d5w]; Morphine and related; Plavix [clopidogrel bisulfate]; Protonix [pantoprazole sodium]; and Venofer [ferric oxide]   Review of Systems Review of Systems  Constitutional: Negative for fever.  Respiratory: Positive for shortness of breath.   Cardiovascular: Positive for chest pain. Negative for syncope.  Gastrointestinal: Negative for abdominal pain, nausea and vomiting.  Neurological: Positive for weakness.  All other systems reviewed and are negative.    Physical Exam Updated Vital Signs BP (!) 236/86 (BP Location: Right Arm) Comment (BP Location): Repeat BP LA: 244/76  Pulse 73   Temp 98.4 F (36.9 C) (Oral)   Resp 19   Ht 5\' 1"  (1.549 m)   Wt 130 lb (59 kg)   SpO2 96%   BMI 24.56 kg/m   Physical Exam  Constitutional: She is oriented to person, place, and time. She appears well-developed and well-nourished. No distress.  HENT:  Head: Normocephalic and atraumatic.  Mouth/Throat: Oropharynx is clear and moist.  Eyes: Conjunctivae and EOM are normal. Pupils are equal, round, and reactive to light.  Neck: Normal range of motion. Neck supple.  Cardiovascular: Normal rate, regular rhythm and intact distal pulses.   No murmur heard. Pulmonary/Chest: Effort normal and breath sounds normal. No respiratory distress. She has no wheezes. She has  no rales.  Catheter present in the Right upper chest wall    Abdominal: Soft. She exhibits no distension. There is no tenderness. There is no rebound and no guarding.  Musculoskeletal: Normal range of motion. She exhibits no edema or tenderness.  Neurological: She is alert and oriented to person, place, and time.  Skin: Skin is warm and dry. No rash noted. No erythema.  Psychiatric: She has a normal mood and affect. Her behavior is normal.  Nursing note and vitals reviewed.    ED Treatments / Results  Labs (all labs ordered are listed, but only abnormal results are displayed) Labs Reviewed  BASIC METABOLIC PANEL - Abnormal; Notable for the following:       Result Value   Potassium 5.3 (*)    Chloride 96 (*)    Glucose, Bld 108 (*)    BUN 74 (*)    Creatinine, Ser 10.57 (*)    Calcium 8.3 (*)    GFR calc non Af Amer 3 (*)    GFR calc Af Amer 4 (*)    Anion gap 17 (*)    All other components within normal limits  CBC - Abnormal; Notable for the following:    RBC 3.21 (*)    Hemoglobin 9.8 (*)    HCT 30.5 (*)    RDW 16.1 (*)    All other components within normal limits  I-STAT TROPOININ, ED    EKG  EKG Interpretation  Date/Time:  Monday September 19 2016 10:57:49 EDT Ventricular Rate:  75 PR Interval:  174 QRS Duration: 106 QT Interval:  434 QTC Calculation: 484 R Axis:   -9 Text Interpretation:  Normal sinus rhythm Possible Left atrial enlargement Incomplete left bundle branch block Left ventricular hypertrophy with repolarization abnormality Abnormal ECG No STEMI. Similar to prior.  Confirmed by LONG MD, JOSHUA [phone]) on 09/19/2016 11:08:04 AM       Radiology Dg Chest 2 View  Result Date: 09/19/2016 CLINICAL DATA:  Dialysis.  Chest pains. EXAM: CHEST  2 VIEW COMPARISON:  09/07/2016. FINDINGS: Dialysis catheter noted with its tip at the cavoatrial junction. Prior CABG. Cardiomegaly. Near complete clearing of previously identified mild interstitial prominence. Mild bibasilar subsegmental atelectasis. Tiny bilateral pleural  effusions. No pneumothorax. IMPRESSION: 1. Dialysis catheter noted is stable position. 2. Cardiomegaly. Previously identified pulmonary interstitial edema has almost completely cleared. Mild bibasilar subsegmental atelectasis . Electronically Signed   By: Marcello Moores  Register   On: 09/19/2016 12:02    Procedures Procedures (including critical care time)  Medications Ordered in ED Medications - No data to display   Initial Impression / Assessment and Plan / ED Course  I have reviewed the triage vital signs and the nursing notes.  Pertinent labs & imaging results that were available during my care of the patient were reviewed by me and considered in my medical decision making (see chart for details).     Patient is a 76 y/o female with multiple medical problems including coronary artery disease status post CABG, stenting and most recently an abnormal stress test in January who at the time was not a candidate for catheterization due to bleeding AVMs that required cauterization. Patient has been seen twice before today this year for chest pain that is convincing for angina. Patient is pain-free currently and that was after taking 3 nitroglycerin and aspirin.  Patient was thought to have demand ischemia her last hospitalization for chest pain related to hypertension and need for dialysis. Patient states she was in her  normal state of health today was getting ready for dialysis when the pain started. She is compliant with her dialysis and her blood pressure medications. Initial EKG and blood tests within normal limits however patient's EKG was done when pain was resolved. Spoke with Dr. Debara Pickett urology who will come and evaluate the patient. She is already received aspirin prior to arrival and she is pain-free at this time does not need further nitroglycerin. Labs are consistent with end-stage renal disease and in need of routine dialysis but no need for emergent dialysis. X-ray without signs of pulmonary  edema.  Cardiology will admit pt.  Final Clinical Impressions(s) / ED Diagnoses   Final diagnoses:  Unstable angina ([place])  Hypertension secondary to other renal disorders  ESRD (end stage renal disease) ([place])    New Prescriptions New Prescriptions   No medications on file     Blanchie Dessert, MD 09/19/16 2009

## 2016-09-19 NOTE — Progress Notes (Signed)
ANTICOAGULATION CONSULT NOTE - Initial Consult  Pharmacy Consult for Heparin  Indication: chest pain/ACS  Allergies  Allergen Reactions  . Aspirin Other (See Comments)    High Doses Mess up her stomach; "makes my bowels have blood in them". Takes 81 mg EC Aspirin   . Penicillins Other (See Comments)    SYNCOPE? , "makes me real weak when I take it; like I'll pass out"  Has patient had a PCN reaction causing immediate rash, facial/tongue/throat swelling, SOB or lightheadedness with hypotension: no Has patient had a PCN reaction causing severe rash involving mucus membranes or skin necrosis: no Has patient had a PCN reaction that required hospitalization no Has patient had a PCN reaction occurring within the last 10 years: no If all of the above  . Amlodipine Swelling and Other (See Comments)    Leg swelling  . Bactrim [Sulfamethoxazole-Trimethoprim] Rash  . Contrast Media [Iodinated Diagnostic Agents] Itching  . Iron Itching and Other (See Comments)    "they gave me iron in dialysis; had to give me Benadryl cause I had to have the iron" (05/02/2012)  . Nitrofurantoin Hives  . Prilosec [Omeprazole] Other (See Comments)    "back spasms"  . Tylenol [Acetaminophen] Other (See Comments)    Makes her feet on fire per pt  . Dexilant [Dexlansoprazole] Other (See Comments)    Upset stomach  . Levaquin [Levofloxacin In D5w] Rash  . Morphine And Related Itching    Itching in feet  . Plavix [Clopidogrel Bisulfate] Rash  . Protonix [Pantoprazole Sodium] Rash  . Venofer [Ferric Oxide] Itching    Patient reports using Benadryl prior to doses as [facility]    Patient Measurements: Height: 5\' 1"  (154.9 cm) Weight: 130 lb (59 kg) IBW/kg (Calculated) : 47.8 Heparin Dosing Weight: 59 kg  Vital Signs: Temp: 98.4 F (36.9 C) (03/12 1105) Temp Source: Oral (03/12 1105) BP: 222/90 (03/12 1545) Pulse Rate: 67 (03/12 1545)  Labs:  Recent Labs  09/19/16 1140  HGB 9.8*  HCT 30.5*   PLT 232  CREATININE 10.57*    Estimated Creatinine Clearance: 3.7 mL/min (by C-G formula based on SCr of 10.57 mg/dL (H)).   Medical History: Past Medical History:  Diagnosis Date  . Anxiety   . Arthritis   . AVM (arteriovenous malformation) of colon   . CAD (coronary artery disease)    a. 12/2011 NSTEMI/Cath/PCI LCX (2.25x14 Resolute DES) & D1 (2.25x22 Resolute DES);  b. 01/2012 Cath/PCI: LM 30, LAD 30p, 40-33m, D1 stent ok, 99 in sm branch of diag, LCX patent stent, OM1 20, RCA 95 ost (4.0x12 Promus DES), EF 55%;  c. 04/2012 Lexi Cardiolite  EF 48%, small area of scar @ base/mid inflat wall with mild peri-infarct ischemia.; CABG 12/4  . Carotid artery disease ([place])    a. 95-62% LICA, 07/3084   . Chronic bronchitis ([place])   . Chronic diastolic CHF (congestive heart failure) ([place])    a. 02/2012 Echo EF 60-65%, nl wall motion, Gr 1 DD, mod MR  . Colon cancer ([place]) 1992  . Esophageal stricture   . ESRD on hemodialysis ([place])    ESRD due to HTN, started dialysis 2011 and gets HD at [facility] with Dr Hinda Lenis on MWF schedule.  Access is LUA AVF as of Sept 2014.   Marland Kitchen GERD (gastroesophageal reflux disease)   . High cholesterol 12/2011  . History of blood transfusion 07/2011; 12/2011; 01/2012 X 2; 04/2012  . History of gout   . History of  lower GI bleeding   . Hypertension   . Iron deficiency anemia   . Mitral regurgitation    a. Moderate by echo, 02/2012  . Myocardial infarction   . Ovarian cancer ([place]) 1992  . Pneumonia ~ 2009  . PUD (peptic ulcer disease)   . TIA (transient ischemic attack)     Medications:  Prescriptions Prior to Admission  Medication Sig Dispense Refill Last Dose  . ALPRAZolam (XANAX) 0.25 MG tablet Take 0.25-0.5 mg by mouth at bedtime as needed for sleep.    09/18/2016 at Unknown time  . amiodarone (PACERONE) 200 MG tablet Take 1 tablet (200 mg total) by mouth daily. 30 tablet 6 09/19/2016 at Unknown time  . aspirin EC 81 MG tablet Take 81 mg by mouth daily.     09/19/2016 at Unknown time  . cloNIDine (CATAPRES) 0.2 MG tablet Take 1 tablet (0.2 mg total) by mouth 3 (three) times daily. 90 tablet 3 09/19/2016 at Unknown time  . doxycycline (VIBRAMYCIN) 100 MG capsule Take 1 capsule (100 mg total) by mouth 2 (two) times daily. 14 capsule 0 09/18/2016 at Unknown time  . epoetin alfa (EPOGEN,PROCRIT) 62831 UNIT/ML injection Inject 1 mL (10,000 Units total) into the vein every Monday, Wednesday, and Friday with hemodialysis. 1 mL 10 Past Week at Unknown time  . fluticasone (FLONASE) 50 MCG/ACT nasal spray Place 2 sprays into the nose daily as needed for allergies.    09/19/2016 at Unknown time  . hydrALAZINE (APRESOLINE) 50 MG tablet Take 1 tablet (50 mg total) by mouth every 8 (eight) hours. 90 tablet 0 09/18/2016 at Unknown time  . isosorbide mononitrate (IMDUR) 30 MG 24 hr tablet Take 3 tablets (90 mg total) by mouth daily. 30 tablet 0 09/18/2016 at Unknown time  . lanthanum (FOSRENOL) 1000 MG chewable tablet Chew 1 tablet (1,000 mg total) by mouth 3 (three) times daily with meals. 90 tablet 1 09/18/2016 at Unknown time  . multivitamin (RENA-VIT) TABS tablet Take 1 tablet by mouth daily.   09/18/2016 at Unknown time  . oxyCODONE-acetaminophen (PERCOCET/ROXICET) 5-325 MG tablet Take 1 tablet by mouth every 6 (six) hours as needed. 20 tablet 0 Past Week at Unknown time  . pantoprazole (PROTONIX) 40 MG tablet Take 1 tablet (40 mg total) by mouth daily. 30 tablet 1 09/18/2016 at Unknown time  . PROAIR HFA 108 (90 BASE) MCG/ACT inhaler Inhale 1 puff into the lungs every 6 (six) hours as needed for wheezing or shortness of breath.    09/18/2016 at Unknown time  . SENSIPAR 30 MG tablet Take 30 mg by mouth daily with supper.    09/18/2016 at Unknown time  . simvastatin (ZOCOR) 20 MG tablet Take 1 tablet (20 mg total) by mouth at bedtime. 90 tablet 3 09/18/2016 at Unknown time  . nitroGLYCERIN (NITROSTAT) 0.4 MG SL tablet Place 1 tablet (0.4 mg total) under the tongue every 5 (five)  minutes as needed for chest pain. 30 tablet 1 unk   Scheduled:  Marland Kitchen Darbepoetin Alfa      . [START ON 09/20/2016] amiodarone  200 mg Oral Daily  . [START ON 09/20/2016] aspirin EC  81 mg Oral Daily  . cinacalcet  30 mg Oral Q supper  . cloNIDine  0.2 mg Oral TID  . darbepoetin (ARANESP) injection - DIALYSIS  150 mcg Intravenous Q Mon-HD  . doxycycline  100 mg Oral BID  . heparin  3,500 Units Intravenous Once  . hydrALAZINE  50 mg Oral Q8H  . lanthanum  1,000 mg Oral TID WC  . multivitamin  1 tablet Oral Daily  . nitroGLYCERIN      . pantoprazole  40 mg Oral Daily  . simvastatin  20 mg Oral QHS  . sodium chloride flush  3 mL Intravenous Q12H   Infusions:  . heparin    . nitroGLYCERIN     PRN: sodium chloride, sodium chloride, sodium chloride, albuterol, ALPRAZolam, alteplase, fluticasone, heparin, heparin, lidocaine (PF), lidocaine-prilocaine, nitroGLYCERIN, ondansetron (ZOFRAN) IV, oxyCODONE-acetaminophen, pentafluoroprop-tetrafluoroeth, sodium chloride flush  Assessment: Patient is a 32 yof who presented 3/12 with chest pain. Not on anticoagulation prior to admission. She has ESRD on HD MWF.   Patient's baseline hemoglobin is low at 9.8 g/dL and platelet count within normal limits. Spoke with patient, has not had any signs/symptoms of bleeding recently.  Goal of Therapy:  Heparin level 0.3-0.7 units/ml Monitor platelets by anticoagulation protocol: Yes   Plan:  Give 3500 units bolus x 1 Start heparin infusion at 700 units/hr Check anti-Xa level in 8 hours and daily while on heparin Continue to monitor H&H and platelets  Demetrius Charity, PharmD Acute Care Pharmacy Resident  Pager: [phone] 09/19/2016

## 2016-09-19 NOTE — Progress Notes (Signed)
CRITICAL VALUE ALERT  Critical value received:  Troponin 0.07  Date of notification:  0./06/2017  Time of notification:  2301  Critical value read back: Yes  Nurse who received alert:  Fabienne Bruns, RN  MD notified (1st page):  [facility]  Time of first page:  2304  MD notified (2nd page): [place]  Time of second page: 2342  Responding MD:  Loralie Champagne, MD  Time MD responded:  [phone]

## 2016-09-19 NOTE — H&P (Signed)
Patient ID: NEKEISHA AURE MRN: [id number], DOB/AGE: Feb 20, 1941   Admit date: 09/19/2016  Requesting Physician:Dr. Maryan Rued, ER physician Primary Physician: [name], MD Primary Cardiologist: Dr. Bronson Ing Reason for admission: Chest Pain  HPI: Shawna [name] is a 76 y.o. female with a history of ESRD, CAD s/p CABG being medically managed right now due to recent GI bleed from AVM, CHF. HTN- hx of steal syndrome.    [place] presented ER this morning for having chest pain that started while walking into church. It was mid substernal, radiated into her back/bilateral scapulas and at one point up into her jaw. No N/V/D or diaphoresis. Some mild transient SOB. She took a total of 3 nitro and two baby aspirin which eventually resolved her pain, the episode last 1.5 hours. Unable to describe pain aside from "a real bad hurt". She has been having exertional angina "for a while now", taking nitro up to 2-3 times per week. Was started on Imdur at her last admission but did not know what the medication was for, question compliance with this medication??   >>>> Lexiscan  1/19: Moderate-size fixed defect in anterior wall- small portion towards base reversible, global hypokinesis,  EF 41%.   >>>> Echo  07/2017: Septal and apical hypokinesis, Systolic function normal, EF 50-55%,.mild MR regurg.  >>>> Cardiac Cath 12/2013 : Last cardiac catheterization performed on 12/10/2013 showed a patent left main, focal disease in the mid to distal LAD, patent diagonal, patent LIMA to LAD, severe stenosis in proximal left circumflex, patent SVG to left circumflex, 70% proximal ramus stenosis, patent SVG to ramus, occluded stent to ostium of RCA and finely patent SVG to distal RCA. She had a normal Myoview on 08/25/2014.   Problem List  Past Medical History:  Diagnosis Date  . Anxiety   . Arthritis   . AVM (arteriovenous malformation) of colon   . CAD (coronary artery disease)    a. 12/2011 NSTEMI/Cath/PCI LCX  (2.25x14 Resolute DES) & D1 (2.25x22 Resolute DES);  b. 01/2012 Cath/PCI: LM 30, LAD 30p, 40-42m, D1 stent ok, 99 in sm branch of diag, LCX patent stent, OM1 20, RCA 95 ost (4.0x12 Promus DES), EF 55%;  c. 04/2012 Lexi Cardiolite  EF 48%, small area of scar @ base/mid inflat wall with mild peri-infarct ischemia.; CABG 12/4  . Carotid artery disease ([place])    a. 46-27% LICA, 0/3500   . Chronic bronchitis ([place])   . Chronic diastolic CHF (congestive heart failure) ([place])    a. 02/2012 Echo EF 60-65%, nl wall motion, Gr 1 DD, mod MR  . Colon cancer ([place]) 1992  . Esophageal stricture   . ESRD on hemodialysis ([place])    ESRD due to HTN, started dialysis 2011 and gets HD at [facility] with Dr Hinda Lenis on MWF schedule.  Access is LUA AVF as of Sept 2014.   Marland Kitchen GERD (gastroesophageal reflux disease)   . High cholesterol 12/2011  . History of blood transfusion 07/2011; 12/2011; 01/2012 X 2; 04/2012  . History of gout   . History of lower GI bleeding   . Hypertension   . Iron deficiency anemia   . Mitral regurgitation    a. Moderate by echo, 02/2012  . Myocardial infarction   . Ovarian cancer ([place]) 1992  . Pneumonia ~ 2009  . PUD (peptic ulcer disease)   . TIA (transient ischemic attack)     Past Surgical History:  Procedure Laterality Date  . ABDOMINAL HYSTERECTOMY  1992  .  APPENDECTOMY  06/1990  . AV FISTULA PLACEMENT  07/2009   left upper arm  . AV FISTULA PLACEMENT Right 09/06/2016   Procedure: RIGHT FOREARM ARTERIOVENOUS (AV) GRAFT;  Surgeon: Elam Dutch, MD;  Location: [place];  Service: Vascular;  Laterality: Right;  . [place] REMOVAL Right 09/06/2016   Procedure: REMOVAL OF Right Arm ARTERIOVENOUS GORETEX GRAFT and Vein Patch angioplasty of brachial artery;  Surgeon: Angelia Mould, MD;  Location: [place];  Service: Vascular;  Laterality: Right;  . COLON RESECTION  1992  . CORONARY ANGIOPLASTY WITH STENT PLACEMENT  12/15/11   "2"  . CORONARY ANGIOPLASTY WITH STENT PLACEMENT  y/2013   "1;  makes total of 3" (05/02/2012)  . CORONARY ARTERY BYPASS GRAFT  06/13/2012   Procedure: CORONARY ARTERY BYPASS GRAFTING (CABG);  Surgeon: Grace Isaac, MD;  Location: [place];  Service: Open Heart Surgery;  Laterality: N/A;  cabg x four;  using left internal mammary artery, and left leg greater saphenous vein harvested endoscopically  . DILATION AND CURETTAGE OF UTERUS    . ESOPHAGOGASTRODUODENOSCOPY  01/20/2012   Procedure: ESOPHAGOGASTRODUODENOSCOPY (EGD);  Surgeon: Ladene Artist, MD,FACG;  Location: [facility] ENDOSCOPY;  Service: Endoscopy;  Laterality: N/A;  . ESOPHAGOGASTRODUODENOSCOPY N/A 03/26/2013   Procedure: ESOPHAGOGASTRODUODENOSCOPY (EGD);  Surgeon: Irene Shipper, MD;  Location: [facility] ENDOSCOPY;  Service: Endoscopy;  Laterality: N/A;  . ESOPHAGOGASTRODUODENOSCOPY N/A 04/30/2015   Procedure: ESOPHAGOGASTRODUODENOSCOPY (EGD);  Surgeon: Rogene Houston, MD;  [facility];  Service: Endoscopy;  Laterality: N/A;  1pm - moved to 10/20 @ 1:10  . ESOPHAGOGASTRODUODENOSCOPY N/A 07/29/2016   Procedure: ESOPHAGOGASTRODUODENOSCOPY (EGD);  Surgeon: Manus Gunning, MD;  Location: [place];  Service: Gastroenterology;  Laterality: N/A;  enteroscopy  . INTRAOPERATIVE TRANSESOPHAGEAL ECHOCARDIOGRAM  06/13/2012   Procedure: INTRAOPERATIVE TRANSESOPHAGEAL ECHOCARDIOGRAM;  Surgeon: Grace Isaac, MD;  Location: [place];  Service: Open Heart Surgery;  Laterality: N/A;  . IR GENERIC HISTORICAL  07/26/2016   IR FLUORO GUIDE CV LINE RIGHT 07/26/2016 Greggory Keen, MD [facility]-INTER[facility] RAD  . IR GENERIC HISTORICAL  07/26/2016   IR US GUIDE VASC ACCESS RIGHT 07/26/2016 Greggory Keen, MD [facility]  . IR GENERIC HISTORICAL  08/02/2016   IR US GUIDE VASC ACCESS RIGHT 08/02/2016 Greggory Keen, MD [facility]  . IR GENERIC HISTORICAL  08/02/2016   IR FLUORO GUIDE CV LINE RIGHT 08/02/2016 Greggory Keen, MD [facility]  . LEFT HEART CATHETERIZATION WITH CORONARY ANGIOGRAM N/A 12/15/2011   Procedure: LEFT HEART  CATHETERIZATION WITH CORONARY ANGIOGRAM;  Surgeon: Burnell Blanks, MD;  Location: [facility] CATH LAB;  Service: Cardiovascular;  Laterality: N/A;  . LEFT HEART CATHETERIZATION WITH CORONARY ANGIOGRAM N/A 01/10/2012   Procedure: LEFT HEART CATHETERIZATION WITH CORONARY ANGIOGRAM;  Surgeon: Peter M Martinique, MD;  Location: [facility] CATH LAB;  Service: Cardiovascular;  Laterality: N/A;  . LEFT HEART CATHETERIZATION WITH CORONARY ANGIOGRAM N/A 06/08/2012   Procedure: LEFT HEART CATHETERIZATION WITH CORONARY ANGIOGRAM;  Surgeon: Burnell Blanks, MD;  Location: [facility] CATH LAB;  Service: Cardiovascular;  Laterality: N/A;  . LEFT HEART CATHETERIZATION WITH CORONARY/GRAFT ANGIOGRAM N/A 12/10/2013   Procedure: LEFT HEART CATHETERIZATION WITH Beatrix Fetters;  Surgeon: [name], MD;  Location: [facility] CATH LAB;  Service: Cardiovascular;  Laterality: N/A;  . OVARY SURGERY     ovarian cancer  . SHUNTOGRAM N/A 10/15/2013   Procedure: Fistulogram;  Surgeon: Serafina Mitchell, MD;  Location: [facility] CATH LAB;  Service: Cardiovascular;  Laterality: N/A;  . THROMBECTOMY / ARTERIOVENOUS GRAFT REVISION  2011   left upper arm  . TUBAL LIGATION  1980's     Allergies  Allergies  Allergen Reactions  . Aspirin Other (See Comments)    High Doses Mess up her stomach; "makes my bowels have blood in them". Takes 81 mg EC Aspirin   . Penicillins Other (See Comments)    SYNCOPE? , "makes me real weak when I take it; like I'll pass out"  Has patient had a PCN reaction causing immediate rash, facial/tongue/throat swelling, SOB or lightheadedness with hypotension: no Has patient had a PCN reaction causing severe rash involving mucus membranes or skin necrosis: no Has patient had a PCN reaction that required hospitalization no Has patient had a PCN reaction occurring within the last 10 years: no If all of the above  . Amlodipine Swelling and Other (See Comments)    Leg swelling  . Bactrim  [Sulfamethoxazole-Trimethoprim] Rash  . Contrast Media [Iodinated Diagnostic Agents] Itching  . Iron Itching and Other (See Comments)    "they gave me iron in dialysis; had to give me Benadryl cause I had to have the iron" (05/02/2012)  . Nitrofurantoin Hives  . Prilosec [Omeprazole] Other (See Comments)    "back spasms"  . Tylenol [Acetaminophen] Other (See Comments)    Makes her feet on fire per pt  . Dexilant [Dexlansoprazole] Other (See Comments)    Upset stomach  . Levaquin [Levofloxacin In D5w] Rash  . Morphine And Related Itching    Itching in feet  . Plavix [Clopidogrel Bisulfate] Rash  . Protonix [Pantoprazole Sodium] Rash  . Venofer [Ferric Oxide] Itching    Patient reports using Benadryl prior to doses as [facility] Medications  Prior to Admission medications   Medication Sig Start Date End Date Taking? Authorizing Provider  ALPRAZolam (XANAX) 0.25 MG tablet Take 0.25-0.5 mg by mouth at bedtime as needed for sleep.  11/19/15  Yes Historical Provider, MD  amiodarone (PACERONE) 200 MG tablet Take 1 tablet (200 mg total) by mouth daily. 08/19/16  Yes Herminio Commons, MD  aspirin EC 81 MG tablet Take 81 mg by mouth daily.    Yes Historical Provider, MD  cloNIDine (CATAPRES) 0.2 MG tablet Take 1 tablet (0.2 mg total) by mouth 3 (three) times daily. 09/02/16  Yes Herminio Commons, MD  doxycycline (VIBRAMYCIN) 100 MG capsule Take 1 capsule (100 mg total) by mouth 2 (two) times daily. 09/08/16  Yes Clanford Marisa Hua, MD  epoetin alfa (EPOGEN,PROCRIT) 56812 UNIT/ML injection Inject 1 mL (10,000 Units total) into the vein every Monday, Wednesday, and Friday with hemodialysis. 04/11/16  Yes Orvan Falconer, MD  fluticasone (FLONASE) 50 MCG/ACT nasal spray Place 2 sprays into the nose daily as needed for allergies.    Yes Historical Provider, MD  hydrALAZINE (APRESOLINE) 50 MG tablet Take 1 tablet (50 mg total) by mouth every 8 (eight) hours. 07/10/16  Yes Thurnell Lose, MD    isosorbide mononitrate (IMDUR) 30 MG 24 hr tablet Take 3 tablets (90 mg total) by mouth daily. 08/03/16  Yes Kinnie Feil, MD  lanthanum (FOSRENOL) 1000 MG chewable tablet Chew 1 tablet (1,000 mg total) by mouth 3 (three) times daily with meals. 04/08/16  Yes Orvan Falconer, MD  multivitamin (RENA-VIT) TABS tablet Take 1 tablet by mouth daily.   Yes Historical Provider, MD  oxyCODONE-acetaminophen (PERCOCET/ROXICET) 5-325 MG tablet Take 1 tablet by mouth every 6 (six) hours as needed. 09/06/16  Yes Alvia Grove, PA-C  pantoprazole (PROTONIX)  40 MG tablet Take 1 tablet (40 mg total) by mouth daily. 08/03/16  Yes Kinnie Feil, MD  PROAIR HFA 108 (90 BASE) MCG/ACT inhaler Inhale 1 puff into the lungs every 6 (six) hours as needed for wheezing or shortness of breath.  01/09/15  Yes Historical Provider, MD  SENSIPAR 30 MG tablet Take 30 mg by mouth daily with supper.  12/21/12  Yes Historical Provider, MD  simvastatin (ZOCOR) 20 MG tablet Take 1 tablet (20 mg total) by mouth at bedtime. 04/04/16  Yes Lendon Colonel, NP  nitroGLYCERIN (NITROSTAT) 0.4 MG SL tablet Place 1 tablet (0.4 mg total) under the tongue every 5 (five) minutes as needed for chest pain. 08/02/16   Kinnie Feil, MD    Family History  Family History  Problem Relation Age of Onset  . Heart disease Mother     Heart Disease before age 58  . Hyperlipidemia Mother   . Hypertension Mother   . Diabetes Mother   . Heart attack Mother   . Heart disease Father     Heart Disease before age 40  . Hyperlipidemia Father   . Hypertension Father   . Diabetes Father   . Diabetes Sister   . Hypertension Sister   . Diabetes Brother   . Hyperlipidemia Brother   . Heart attack Brother   . Hypertension Sister   . Heart attack Brother   . Other      noncontributory for early CAD  . Colon cancer Neg Hx   . Esophageal cancer Neg Hx   . Liver disease Neg Hx   . Kidney disease Neg Hx   . Colon polyps Neg Hx    Family Status   Relation Status  . Mother Deceased at age 31  . Father Deceased at age 60  . Sister Alive  . Brother Alive  . Sister Alive  . Brother Deceased  . Other Alive  . Child Deceased   good health  . Other Alive  . Daughter Alive  .    Marland Kitchen Neg Hx     Social History  Social History   Social History  . Marital status: Married    Spouse name: N/A  . Number of children: N/A  . Years of education: N/A   Occupational History  . Not on file.   Social History Main Topics  . Smoking status: Never Smoker  . Smokeless tobacco: Never Used  . Alcohol use No  . Drug use: No  . Sexual activity: Yes    Birth control/ protection: Surgical   Other Topics Concern  . Not on file   Social History Narrative   Lives in [place], [place] with husband.  Dialysis pt - mwf.     Review of Systems General:  No chills, fever, night sweats or weight changes.  Cardiovascular:  No edema, orthopnea, palpitations, paroxysmal nocturnal dyspnea. Dermatological: No rash, lesions/masses Respiratory: No cough, dyspnea Urologic: No hematuria, dysuria Abdominal:   No nausea, vomiting, diarrhea, bright red blood per rectum, melena, or hematemesis Neurologic:  No visual changes, wkns, changes in mental status. All other systems reviewed and are otherwise negative except as noted above.  Physical Exam  Blood pressure (!) 236/86, pulse 73, temperature 98.4 F (36.9 C), temperature source Oral, resp. rate 19, height 5\' 1"  (1.549 m), weight 130 lb (59 kg), SpO2 96 %.   General: Pleasant, NAD Psych: Normal affect. Neuro: Alert and oriented X 3. Moves all extremities spontaneously. HEENT: Normal  Neck:  Supple without bruits or JVD. Lungs:  Resp regular and unlabored, CTA. Heart: RRR no s3, s4, or murmurs. Abdomen: Soft, non-tender, non-distended, BS + x 4.  Extremities: No clubbing, cyanosis or edema. DP/PT/Radials 2+ and equal bilaterally.  Labs  No results for input(s): CKTOTAL, CKMB, TROPONINI in the last  72 hours. Lab Results  Component Value Date   WBC 7.3 09/19/2016   HGB 9.8 (L) 09/19/2016   HCT 30.5 (L) 09/19/2016   MCV 95.0 09/19/2016   PLT 232 09/19/2016    Recent Labs Lab 09/19/16 1140  NA 136  K 5.3*  CL 96*  CO2 23  BUN 74*  CREATININE 10.57*  CALCIUM 8.3*  GLUCOSE 108*   Lab Results  Component Value Date   CHOL 129 07/07/2012   HDL 51 07/07/2012   LDLCALC 59 07/07/2012   TRIG 105 01/21/2015   Lab Results  Component Value Date   DDIMER 1.97 (H) 07/03/2012     Radiology/Studies  Dg Chest 2 View Result Date: 09/19/2016 IMPRESSION: 1. Dialysis catheter noted is stable position. 2. Cardiomegaly. Previously identified pulmonary interstitial edema has almost completely cleared. Mild bibasilar subsegmental atelectasis . Electronically Signed   By: Marcello Moores  Register   On: 09/19/2016 12:02   Dg Chest 2 View Result Date: 09/07/2016  IMPRESSION: 1. Vascular congestion and borderline cardiomegaly. Bibasilar airspace opacities may reflect mild interstitial edema. Small bilateral pleural effusions seen. 2. Scattered aortic atherosclerosis. Electronically Signed   By: Garald Balding M.D.   On: 09/07/2016 02:36    ECG  HR 75, NSR, incomplete LBBB, LVH.- interpreted personally.  ASSESSMENT AND PLAN  Unstable angina: patient having rreoccuring pain requiring increasingly more doses of NItro tablets for pain control. Unable to do cath back in February of this year due to GI bleed from AVM. Pt now stable. Plan is for heart cath 3/13 with Dr. Tamala Julian at 1:30pm.  CAD: ALLERGY TO CATH DYE; will need Benadryl, Solumedrol and Pepcid.  Last cardiac catheterization performed on 12/10/2013 showed a patent left main, focal disease in the mid to distal LAD, patent diagonal, patent LIMA to LAD, severe stenosis in proximal left circumflex, patent SVG to left circumflex, 70% proximal ramus stenosis, patent SVG to ramus, occluded stent to ostium of RCA and finely patent SVG to distal RCA. She  had a normal Myoview on 08/25/2014.  Hypertension: Uncontrolled hypertension: Will start on Heparin and Nitro drip.  ESRD: Nephrology on board for Dialysis. Dialysis,  MWF in Center  CHF:  Monitor I/O's Daily Weights Volume per nephrology    Signed, Linus Mako, PA-C 09/19/2016, 2:52 PM  Pager ###

## 2016-09-19 NOTE — ED Triage Notes (Addendum)
Pt reports she was getting ready to go to dialysis when she began having chest pain that radiates to her back, pt reports taking 3 sl nitro and 2-81mg  asa without much relief. Pt a/ox4, resp e/u, nad.

## 2016-09-19 NOTE — ED Notes (Signed)
Pt refusing an IV from this RN.  States she want me to use her Dialysis Port.  Informed Pt that I cannot.

## 2016-09-19 NOTE — ED Provider Notes (Signed)
Emergency Ultrasound Study:   Angiocath insertion Performed by: Evonnie Pat Consent: Verbal consent/emergent consent obtained. Risks and benefits: risks, benefits and alternatives were discussed Immediately prior to procedure the correct patient, procedure, equipment, support staff and site/side marked as needed. Indication: difficult IV access; according to RN and primary MD left arm is okay s/p failed AVF Preparation: Patient was prepped and draped in the usual sterile fashion. Sterile gel was used for this procedure and the ultrasound probe was sterilized prior to use. Vein Location: Left forearm vein was visualized during assessment for potential access sites and was found to be patent/ easily compressed with linear ultrasound.  The needle was visualized with real-time ultrasound and guided into the vein. Gauge: 20 Image saved and stored.  Normal blood return.   Patient tolerance: Patient tolerated the procedure well with no immediate complications.        Duffy Bruce, MD 09/19/16 [phone]

## 2016-09-19 NOTE — Consult Note (Signed)
Reason for Consult: Continuity of ESRD care Referring Physician: Lyman Bishop M.D. (cardiology)  HPI:  76 year old African-American woman with past medical history significant for end-stage renal disease on hemodialysis on a Monday/Wednesday/Friday schedule ([facility], [place]), underlying coronary artery disease status post CABG, history of recent GI bleed from AVM, congestive heart failure and hypertension who presented to the emergency room with substernal chest pain lasting several hours and relieved by nitroglycerin in the emergency room. She denies any associated diaphoresis and reports that the chest pain seemed to radiate to her back and maybe into her jaw. She had some mild/transient shortness of breath but is fairly comfortable at the time that I saw her without any further chest pain.  2 weeks ago, she had an attempted right forearm Loop AVG creation however developed severe steal syndrome of the ipsilateral hand and had the graft removed with vein patch angioplasty of the right brachial artery. She complains of being a "difficult stick" and inquires whether we could allow access of her dialysis catheter for blood work.  Past Medical History:  Diagnosis Date  . Anxiety   . Arthritis   . AVM (arteriovenous malformation) of colon   . CAD (coronary artery disease)    a. 12/2011 NSTEMI/Cath/PCI LCX (2.25x14 Resolute DES) & D1 (2.25x22 Resolute DES);  b. 01/2012 Cath/PCI: LM 30, LAD 30p, 40-39m D1 stent ok, 99 in sm branch of diag, LCX patent stent, OM1 20, RCA 95 ost (4.0x12 Promus DES), EF 55%;  c. 04/2012 Lexi Cardiolite  EF 48%, small area of scar @ base/mid inflat wall with mild peri-infarct ischemia.; CABG 12/4  . Carotid artery disease ([place]    a. 601-74%LICA, 99/4496  . Chronic bronchitis ([place]   . Chronic diastolic CHF (congestive heart failure) ([facility] [place]    a. 02/2012 Echo EF 60-65%, nl wall motion, Gr 1 DD, mod MR  . Colon cancer ([place] 1992  . Esophageal stricture   . ESRD on hemodialysis  ([facility]    ESRD due to HTN, started [facility] 2011 [facility]  Access is LUA AVF as of Sept 2014.   .Marland KitchenGERD (gastroesophageal reflux disease)   . High cholesterol 12/2011  . History of blood transfusion 07/2011; 12/2011; 01/2012 X 2; 04/2012  . History of gout   . History of lower GI bleeding   . Hypertension   . Iron deficiency anemia   . Mitral regurgitation    a. Moderate by echo, 02/2012  . Myocardial infarction   . Ovarian cancer ([name] 1992  . Pneumonia ~ 2009  . PUD (peptic ulcer disease)   . TIA (transient ischemic attack)     Past Surgical History:  Procedure Laterality Date  . ABDOMINAL HYSTERECTOMY  1992  . APPENDECTOMY  06/1990  . AV FISTULA PLACEMENT  07/2009   left upper arm  . AV FISTULA PLACEMENT Right 09/06/2016   Procedure: RIGHT FOREARM ARTERIOVENOUS (AV) GRAFT;  Surgeon: CElam Dutch MD;  [place]  Service: Vascular;  Laterality: Right;  . AMontezumaREMOVAL Right 09/06/2016   Procedure: REMOVAL OF Right Arm ARTERIOVENOUS GORETEX GRAFT and Vein Patch angioplasty of brachial artery;  Surgeon: CAngelia Mould MD;  [place]  Service: Vascular;  Laterality: Right;  . COLON RESECTION  1992  . CORONARY ANGIOPLASTY WITH STENT PLACEMENT  12/15/11   "2"  . CORONARY ANGIOPLASTY WITH STENT PLACEMENT  y/2013   "1; makes total of 3" (05/02/2012)  .  CORONARY ARTERY BYPASS GRAFT  06/13/2012   Procedure: CORONARY ARTERY BYPASS GRAFTING (CABG);  Surgeon: Grace Isaac, MD;  Location: [place];  Service: Open Heart Surgery;  Laterality: N/A;  cabg x four;  using left internal mammary artery, and left leg greater saphenous vein harvested endoscopically  . DILATION AND CURETTAGE OF UTERUS    . ESOPHAGOGASTRODUODENOSCOPY  01/20/2012   Procedure: ESOPHAGOGASTRODUODENOSCOPY (EGD);  Surgeon: Ladene Artist, MD,FACG;  Location: [facility] ENDOSCOPY;  Service: Endoscopy;  Laterality: N/A;  . ESOPHAGOGASTRODUODENOSCOPY N/A 03/26/2013    Procedure: ESOPHAGOGASTRODUODENOSCOPY (EGD);  Surgeon: Irene Shipper, MD;  Location: [facility] ENDOSCOPY;  Service: Endoscopy;  Laterality: N/A;  . ESOPHAGOGASTRODUODENOSCOPY N/A 04/30/2015   Procedure: ESOPHAGOGASTRODUODENOSCOPY (EGD);  Surgeon: Rogene Houston, MD;  [facility];  Service: Endoscopy;  Laterality: N/A;  1pm - moved to 10/20 @ 1:10  . ESOPHAGOGASTRODUODENOSCOPY N/A 07/29/2016   Procedure: ESOPHAGOGASTRODUODENOSCOPY (EGD);  Surgeon: Manus Gunning, MD;  Location: [place];  Service: Gastroenterology;  Laterality: N/A;  enteroscopy  . INTRAOPERATIVE TRANSESOPHAGEAL ECHOCARDIOGRAM  06/13/2012   Procedure: INTRAOPERATIVE TRANSESOPHAGEAL ECHOCARDIOGRAM;  Surgeon: Grace Isaac, MD;  Location: [place];  Service: Open Heart Surgery;  Laterality: N/A;  . IR GENERIC HISTORICAL  07/26/2016   IR FLUORO GUIDE CV LINE RIGHT 07/26/2016 Greggory Keen, MD [facility]  . IR GENERIC HISTORICAL  07/26/2016   IR US GUIDE VASC ACCESS RIGHT 07/26/2016 Greggory Keen, MD [facility]  . IR GENERIC HISTORICAL  08/02/2016   IR US GUIDE VASC ACCESS RIGHT 08/02/2016 Greggory Keen, MD [facility]  . IR GENERIC HISTORICAL  08/02/2016   IR FLUORO GUIDE CV LINE RIGHT 08/02/2016 Greggory Keen, MD [facility]  . LEFT HEART CATHETERIZATION WITH CORONARY ANGIOGRAM N/A 12/15/2011   Procedure: LEFT HEART CATHETERIZATION WITH CORONARY ANGIOGRAM;  Surgeon: Burnell Blanks, MD;  Location: [facility] CATH LAB;  Service: Cardiovascular;  Laterality: N/A;  . LEFT HEART CATHETERIZATION WITH CORONARY ANGIOGRAM N/A 01/10/2012   Procedure: LEFT HEART CATHETERIZATION WITH CORONARY ANGIOGRAM;  Surgeon: Peter M Martinique, MD;  Location: [facility] CATH LAB;  Service: Cardiovascular;  Laterality: N/A;  . LEFT HEART CATHETERIZATION WITH CORONARY ANGIOGRAM N/A 06/08/2012   Procedure: LEFT HEART CATHETERIZATION WITH CORONARY ANGIOGRAM;  Surgeon: Burnell Blanks, MD;  Location: [facility] CATH LAB;  Service: Cardiovascular;  Laterality:  N/A;  . LEFT HEART CATHETERIZATION WITH CORONARY/GRAFT ANGIOGRAM N/A 12/10/2013   Procedure: LEFT HEART CATHETERIZATION WITH Beatrix Fetters;  Surgeon: [name], MD;  Location: [facility] CATH LAB;  Service: Cardiovascular;  Laterality: N/A;  . OVARY SURGERY     ovarian cancer  . SHUNTOGRAM N/A 10/15/2013   Procedure: Fistulogram;  Surgeon: Serafina Mitchell, MD;  Location: [facility] C[facility] LAB;  Service: Cardiovascular;  Laterality: N/A;  . THROMBECTOMY / ARTERIOVENOUS GRAFT REVISION  2011   left upper arm  . TUBAL LIGATION  1980's    Family History  Problem Relation Age of Onset  . Heart disease Mother     Heart Disease before age 40  . Hyperlipidemia Mother   . Hypertension Mother   . Diabetes Mother   . Heart attack Mother   . Heart disease Father     Heart Disease before age 37  . Hyperlipidemia Father   . Hypertension Father   . Diabetes Father   . Diabetes Sister   . Hypertension Sister   . Diabetes Brother   . Hyperlipidemia Brother   . Heart attack Brother   . Hypertension Sister   . Heart  attack Brother   . Other      noncontributory for early CAD  . Colon cancer Neg Hx   . Esophageal cancer Neg Hx   . Liver disease Neg Hx   . Kidney disease Neg Hx   . Colon polyps Neg Hx     Social History:  reports that she has never smoked. She has never used smokeless tobacco. She reports that she does not drink alcohol or use drugs.  Allergies:  Allergies  Allergen Reactions  . Aspirin Other (See Comments)    High Doses Mess up her stomach; "makes my bowels have blood in them". Takes 81 mg EC Aspirin   . Penicillins Other (See Comments)    SYNCOPE? , "makes me real weak when I take it; like I'll pass out"  Has patient had a PCN reaction causing immediate rash, facial/tongue/throat swelling, SOB or lightheadedness with hypotension: no Has patient had a PCN reaction causing severe rash involving mucus membranes or skin necrosis: no Has patient had a PCN reaction that  required hospitalization no Has patient had a PCN reaction occurring within the last 10 years: no If all of the above  . Amlodipine Swelling and Other (See Comments)    Leg swelling  . Bactrim [Sulfamethoxazole-Trimethoprim] Rash  . Contrast Media [Iodinated Diagnostic Agents] Itching  . Iron Itching and Other (See Comments)    "they gave me iron in dialysis; had to give me Benadryl cause I had to have the iron" (05/02/2012)  . Nitrofurantoin Hives  . Prilosec [Omeprazole] Other (See Comments)    "back spasms"  . Tylenol [Acetaminophen] Other (See Comments)    Makes her feet on fire per pt  . Dexilant [Dexlansoprazole] Other (See Comments)    Upset stomach  . Levaquin [Levofloxacin In D5w] Rash  . Morphine And Related Itching    Itching in feet  . Plavix [Clopidogrel Bisulfate] Rash  . Protonix [Pantoprazole Sodium] Rash  . Venofer [Ferric Oxide] Itching    Patient reports using Benadryl prior to doses as [place]    Medications:  Scheduled: . [START ON 09/20/2016] amiodarone  200 mg Oral Daily  . cinacalcet  30 mg Oral Q supper  . cloNIDine  0.2 mg Oral TID  . doxycycline  100 mg Oral BID  . [START ON 09/21/2016] epoetin alfa  10,000 Units Intravenous Q M,W,F-HD  . hydrALAZINE  10 mg Intravenous Once  . hydrALAZINE  50 mg Oral Q8H  . lanthanum  1,000 mg Oral TID WC  . multivitamin  1 tablet Oral Daily  . pantoprazole  40 mg Oral Daily  . simvastatin  20 mg Oral QHS    Results for orders placed or performed during the hospital encounter of 09/19/16 (from the past 48 hour(s))  Basic metabolic panel     Status: Abnormal   Collection Time: 09/19/16 11:40 AM  Result Value Ref Range   Sodium 136 135 - 145 mmol/L   Potassium 5.3 (H) 3.5 - 5.1 mmol/L   Chloride 96 (L) 101 - 111 mmol/L   CO2 23 22 - 32 mmol/L   Glucose, Bld 108 (H) 65 - 99 mg/dL   BUN 74 (H) 6 - 20 mg/dL   Creatinine, Ser 10.57 (H) 0.44 - 1.00 mg/dL   Calcium 8.3 (L) 8.9 - 10.3 mg/dL   GFR calc non Af  Amer 3 (L) >60 mL/min   GFR calc Af Amer 4 (L) >60 mL/min    Comment: (NOTE) The eGFR has been  calculated using the CKD EPI equation. This calculation has not been validated in all clinical situations. eGFR's persistently <60 mL/min signify possible Chronic Kidney Disease.    Anion gap 17 (H) 5 - 15  CBC     Status: Abnormal   Collection Time: 09/19/16 11:40 AM  Result Value Ref Range   WBC 7.3 4.0 - 10.5 K/uL   RBC 3.21 (L) 3.87 - 5.11 MIL/uL   Hemoglobin 9.8 (L) 12.0 - 15.0 g/dL   HCT 30.5 (L) 36.0 - 46.0 %   MCV 95.0 78.0 - 100.0 fL   MCH 30.5 26.0 - 34.0 pg   MCHC 32.1 30.0 - 36.0 g/dL   RDW 16.1 (H) 11.5 - 15.5 %   Platelets 232 150 - 400 K/uL  I-stat troponin, ED     Status: None   Collection Time: 09/19/16 11:49 AM  Result Value Ref Range   Troponin i, poc 0.02 0.00 - 0.08 ng/mL   Comment 3            Comment: Due to the release kinetics of cTnI, a negative result within the first hours of the onset of symptoms does not rule out myocardial infarction with certainty. If myocardial infarction is still suspected, repeat the test at appropriate intervals.     Dg Chest 2 View  Result Date: 09/19/2016 CLINICAL DATA:  Dialysis.  Chest pains. EXAM: CHEST  2 VIEW COMPARISON:  09/07/2016. FINDINGS: Dialysis catheter noted with its tip at the cavoatrial junction. Prior CABG. Cardiomegaly. Near complete clearing of previously identified mild interstitial prominence. Mild bibasilar subsegmental atelectasis. Tiny bilateral pleural effusions. No pneumothorax. IMPRESSION: 1. Dialysis catheter noted is stable position. 2. Cardiomegaly. Previously identified pulmonary interstitial edema has almost completely cleared. Mild bibasilar subsegmental atelectasis . Electronically Signed   By: Marcello Moores  Register   On: 09/19/2016 12:02    Review of Systems  Constitutional: Negative.   HENT: Negative.   Eyes: Negative.   Respiratory: Negative.   Cardiovascular: Positive for chest pain. Negative  for palpitations, orthopnea and claudication.  Gastrointestinal: Negative.   Genitourinary: Negative.   Skin: Negative.   Neurological: Negative.    Blood pressure 195/81, pulse (!) 58, temperature 98.4 F (36.9 C), temperature source Oral, resp. rate 14, height 5' 1"  (1.549 m), weight 59 kg (130 lb), SpO2 96 %. Physical Exam  Nursing note and vitals reviewed. Constitutional: She is oriented to person, place, and time. She appears well-developed and well-nourished. No distress.  HENT:  Head: Normocephalic and atraumatic.  Nose: Nose normal.  Mouth/Throat: Oropharynx is clear and moist.  Eyes: EOM are normal. Pupils are equal, round, and reactive to light. No scleral icterus.  Neck: Normal range of motion. Neck supple. No JVD present.  Cardiovascular: Normal rate, regular rhythm and normal heart sounds.   Respiratory: Effort normal and breath sounds normal. She has no wheezes. She has no rales.  Right IJ tunneled hemodialysis catheter in place  GI: Soft. Bowel sounds are normal. There is no tenderness. There is no rebound.  Musculoskeletal: Normal range of motion. She exhibits edema. She exhibits no deformity.  Abandoned left upper arm brachiocephalic fistula-thrombosed with pulsatile inflow  Neurological: She is alert and oriented to person, place, and time.  Skin: Skin is warm and dry.  Psychiatric: She has a normal mood and affect.    Assessment/Plan: 1. Unstable angina: On schedule for coronary angiogram tomorrow after premedication with Solu-Medrol/Benadryl today for history of iodinated intravenous contrast allergy. Medical management being instituted per cardiology recommendations.  2. End-stage renal disease: We'll order for hemodialysis today per usual outpatient schedule, I was unsuccessful in getting through to her dialysis unit today to request records. Will retry this tomorrow. She has mild hyperkalemia and does not appear to be significantly volume overloaded on physical  exam. 3. Hypertension: Resume antihypertensive therapy and monitor with ultrafiltration and hemodialysis. 4. Anemia of chronic kidney disease: Check iron studies and resume Aranesp with hemodialysis today. 5. Metabolic bone disease: Resume phosphorus binders and reconcile medications for vitamin D receptor analog  Shawna [name] K. 09/19/2016, 3:51 PM

## 2016-09-20 ENCOUNTER — Encounter (HOSPITAL_COMMUNITY)
Admission: EM | Disposition: A | Discharge status: Home / Self Care | Payer: Self-pay | Source: Home / Self Care | Attending: Internal Medicine

## 2016-09-20 ENCOUNTER — Encounter (HOSPITAL_COMMUNITY): Payer: Self-pay | Admitting: Interventional Cardiology

## 2016-09-20 DIAGNOSIS — I25110 Atherosclerotic heart disease of native coronary artery with unstable angina pectoris: Secondary | ICD-10-CM

## 2016-09-20 HISTORY — PX: LEFT HEART CATH AND CORONARY ANGIOGRAPHY: CATH118249

## 2016-09-20 LAB — CREATININE, SERUM
Creatinine, Ser: 7.31 mg/dL — ABNORMAL HIGH (ref 0.44–1.00)
GFR calc non Af Amer: 5 mL/min — ABNORMAL LOW (ref >60)
GFR, EST AFRICAN AMERICAN: 6 mL/min — AB (ref >60)

## 2016-09-20 LAB — CBC
HCT: 32.4 % — ABNORMAL LOW (ref 36.0–46.0)
HEMATOCRIT: 31.1 % — AB (ref 36.0–46.0)
HEMOGLOBIN: 10.1 g/dL — AB (ref 12.0–15.0)
Hemoglobin: 10 g/dL — ABNORMAL LOW (ref 12.0–15.0)
MCH: 30.6 pg (ref 26.0–34.0)
MCH: 29.7 pg (ref 26.0–34.0)
MCHC: 32.2 g/dL (ref 30.0–36.0)
MCHC: 31.2 g/dL (ref 30.0–36.0)
MCV: 95.1 fL (ref 78.0–100.0)
MCV: 95.3 fL (ref 78.0–100.0)
PLATELETS: 233 10*3/uL (ref 150–400)
Platelets: 218 10*3/uL (ref 150–400)
RBC: 3.27 MIL/uL — ABNORMAL LOW (ref 3.87–5.11)
RBC: 3.4 MIL/uL — AB (ref 3.87–5.11)
RDW: 16.5 % — AB (ref 11.5–15.5)
RDW: 16.2 % — ABNORMAL HIGH (ref 11.5–15.5)
WBC: 6.6 10*3/uL (ref 4.0–10.5)
WBC: 5.2 10*3/uL (ref 4.0–10.5)

## 2016-09-20 LAB — BASIC METABOLIC PANEL
Anion gap: 11 (ref 5–15)
BUN: 25 mg/dL — AB (ref 6–20)
CO2: 29 mmol/L (ref 22–32)
Calcium: 9 mg/dL (ref 8.9–10.3)
Chloride: 95 mmol/L — ABNORMAL LOW (ref 101–111)
Creatinine, Ser: 5.63 mg/dL — ABNORMAL HIGH (ref 0.44–1.00)
GFR calc Af Amer: 8 mL/min — ABNORMAL LOW (ref >60)
GFR, EST NON AFRICAN AMERICAN: 7 mL/min — AB (ref >60)
GLUCOSE: 74 mg/dL (ref 65–99)
POTASSIUM: 4.2 mmol/L (ref 3.5–5.1)
Sodium: 135 mmol/L (ref 135–145)

## 2016-09-20 LAB — HEPARIN LEVEL (UNFRACTIONATED): Heparin Unfractionated: 0.23 IU/mL — ABNORMAL LOW (ref 0.30–0.70)

## 2016-09-20 LAB — LIPID PANEL
Cholesterol: 169 mg/dL (ref 0–200)
HDL: 70 mg/dL (ref >40)
LDL CALC: 84 mg/dL (ref 0–99)
TRIGLYCERIDES: 76 mg/dL (ref <150)
Total CHOL/HDL Ratio: 2.4 RATIO
VLDL: 15 mg/dL (ref 0–40)

## 2016-09-20 LAB — PROTIME-INR
INR: 1.07
PROTHROMBIN TIME: 14 s (ref 11.4–15.2)

## 2016-09-20 LAB — TROPONIN I
TROPONIN I: 0.12 ng/mL — AB (ref <0.03)
TROPONIN I: 0.06 ng/mL — AB (ref <0.03)

## 2016-09-20 LAB — MRSA PCR SCREENING: MRSA BY PCR: NEGATIVE

## 2016-09-20 SURGERY — LEFT HEART CATH AND CORONARY ANGIOGRAPHY

## 2016-09-20 MED ORDER — IOPAMIDOL (ISOVUE-370) INJECTION 76%
INTRAVENOUS | Status: DC | PRN
  Administered 2016-09-20: 110 mL via INTRA_ARTERIAL

## 2016-09-20 MED ORDER — HYDRALAZINE HCL 20 MG/ML IJ SOLN
INTRAMUSCULAR | Status: AC
  Filled 2016-09-20: qty 1

## 2016-09-20 MED ORDER — FENTANYL CITRATE (PF) 100 MCG/2ML IJ SOLN
INTRAMUSCULAR | Status: AC
  Filled 2016-09-20: qty 2

## 2016-09-20 MED ORDER — SODIUM CHLORIDE 0.9% FLUSH
3.0000 mL | Freq: Two times a day (BID) | INTRAVENOUS | Status: DC
Start: 2016-09-20 — End: 2016-09-22
  Administered 2016-09-20 – 2016-09-21 (×2): 3 mL via INTRAVENOUS

## 2016-09-20 MED ORDER — ONDANSETRON HCL 4 MG/2ML IJ SOLN: 4.0000 mg | Freq: Four times a day (QID) | INTRAMUSCULAR | Status: DC | PRN

## 2016-09-20 MED ORDER — HYDRALAZINE HCL 20 MG/ML IJ SOLN
INTRAMUSCULAR | Status: DC | PRN
  Administered 2016-09-20 (×2): 10 mg via INTRAVENOUS

## 2016-09-20 MED ORDER — FENTANYL CITRATE (PF) 100 MCG/2ML IJ SOLN
INTRAMUSCULAR | Status: DC | PRN
  Administered 2016-09-20: 25 ug via INTRAVENOUS

## 2016-09-20 MED ORDER — SODIUM CHLORIDE 0.9 % IV SOLN: 250.0000 mL | INTRAVENOUS | Status: DC | PRN

## 2016-09-20 MED ORDER — IOPAMIDOL (ISOVUE-370) INJECTION 76%
INTRAVENOUS | Status: AC
  Filled 2016-09-20: qty 100

## 2016-09-20 MED ORDER — MIDAZOLAM HCL 2 MG/2ML IJ SOLN
INTRAMUSCULAR | Status: DC | PRN
  Administered 2016-09-20 (×2): 1 mg via INTRAVENOUS

## 2016-09-20 MED ORDER — DIPHENHYDRAMINE HCL 50 MG/ML IJ SOLN
INTRAMUSCULAR | Status: AC
  Filled 2016-09-20: qty 1

## 2016-09-20 MED ORDER — DIPHENHYDRAMINE HCL 50 MG/ML IJ SOLN
INTRAMUSCULAR | Status: DC | PRN
  Administered 2016-09-20: 25 mg via INTRAVENOUS

## 2016-09-20 MED ORDER — HEPARIN SODIUM (PORCINE) 5000 UNIT/ML IJ SOLN
5000.0000 [IU] | Freq: Three times a day (TID) | INTRAMUSCULAR | Status: DC
  Administered 2016-09-20 – 2016-09-22 (×4): 5000 [IU] via SUBCUTANEOUS
  Filled 2016-09-20 (×6): qty 1

## 2016-09-20 MED ORDER — LIDOCAINE HCL (PF) 1 % IJ SOLN
INTRAMUSCULAR | Status: AC
  Filled 2016-09-20: qty 30

## 2016-09-20 MED ORDER — SODIUM CHLORIDE 0.9% FLUSH: 3.0000 mL | INTRAVENOUS | Status: DC | PRN

## 2016-09-20 MED ORDER — HEPARIN (PORCINE) IN NACL 2-0.9 UNIT/ML-% IJ SOLN
INTRAMUSCULAR | Status: DC | PRN
  Administered 2016-09-20: 1000 mL

## 2016-09-20 MED ORDER — MIDAZOLAM HCL 2 MG/2ML IJ SOLN
INTRAMUSCULAR | Status: AC
  Filled 2016-09-20: qty 2

## 2016-09-20 MED ORDER — IOPAMIDOL (ISOVUE-370) INJECTION 76%
INTRAVENOUS | Status: AC
  Filled 2016-09-20: qty 50

## 2016-09-20 MED ORDER — HEPARIN (PORCINE) IN NACL 2-0.9 UNIT/ML-% IJ SOLN
INTRAMUSCULAR | Status: AC
  Filled 2016-09-20: qty 1000

## 2016-09-20 MED ORDER — LIDOCAINE HCL (PF) 1 % IJ SOLN
INTRAMUSCULAR | Status: DC | PRN
  Administered 2016-09-20: 20 mL via INTRADERMAL

## 2016-09-20 SURGICAL SUPPLY — 11 items
CATH INFINITI 5 FR IM (CATHETERS) ×2 IMPLANT
CATH INFINITI 5FR MPB2 (CATHETERS) ×2 IMPLANT
COVER PRB 48X5XTLSCP FOLD TPE (BAG) ×1 IMPLANT
COVER PROBE 5X48 (BAG) ×1
DEVICE CLOSURE PERCLS PRGLD 6F (VASCULAR PRODUCTS) ×1 IMPLANT
KIT HEART LEFT (KITS) ×2 IMPLANT
PACK CARDIAC CATHETERIZATION (CUSTOM PROCEDURE TRAY) ×2 IMPLANT
PERCLOSE PROGLIDE 6F (VASCULAR PRODUCTS) ×2
SHEATH PINNACLE 5F 10CM (SHEATH) ×2 IMPLANT
TRANSDUCER W/STOPCOCK (MISCELLANEOUS) ×2 IMPLANT
WIRE EMERALD 3MM-J .035X150CM (WIRE) ×2 IMPLANT

## 2016-09-20 NOTE — Progress Notes (Signed)
Cardia catheterization done today results and recommendations are as follows;   Left Heart Cath and Coronary Angiography 09/20/16  Conclusion    Widely patent left internal mammary graft to LAD  Three widely patent saphenous vein grafts including to 1) distal RCA, 2) distal circumflex, and 3) ramus intermedius.  Total occlusion of the ostial RCA due to in-stent restenosis.  80% proximal/ostial ramus intermedius  99% proximal circumflex  60-70% proximal to mid LAD and 80% distal LAD  High-grade in-stent restenosis of the 2.25 mm drug-eluting stent in the proximal to mid diagonal. The lesion extends from the proximal third of the stent into the proximal and ostial segment of the native vessel. Perhaps angina is coming from this territory. The diagonal is moderate in size. Restenting this segment would be associated with very high restenosis rate given the small size. The best treatment option is medical therapy.  Low normal to mildly depressed LV function with EF 40-45%. Mid anterior wall hypokinesis. LVEDP 25 mmHg.  RECOMMENDATIONS:   Aggressive medical therapy of blood pressure.  Repeat PCI on the diagonal was contemplated but long-term patency is anticipated to be very low given the small caliber of the vessel and diffuse in-stent restenosis. Additionally the patient has had GI bleeding and reported allergies to aspirin and Plavix. For these varied reasons, medical therapy seems to be the most prudent treatment in this situation.     The cath results do show area of territory, stent in proximal to mid diagonal, that may be causing her angina, no amenable  to re stenting. No new stents, the recommendation is for aggressive medical therapy and very good control of blood pressures.  Dr. Debara Pickett recommends adding Imdur 15 mg if patients BP remains stable, records show  had dropped very briefly to 84/49 this morning but has ranged between 128/65 and 144/59 since then. Will monitor  closely.

## 2016-09-20 NOTE — Progress Notes (Signed)
ANTICOAGULATION CONSULT NOTE - Follow Up Consult  Pharmacy Consult for heparin Indication: USAP  Labs:  Recent Labs  09/19/16 1140 09/19/16 2124 09/20/16 0347  HGB 9.8*  --  10.0*  HCT 30.5*  --  31.1*  PLT 232  --  218  LABPROT  --   --  14.0  INR  --   --  1.07  HEPARINUNFRC  --   --  0.23*  CREATININE 10.57*  --   --   TROPONINI  --  0.07*  --     Assessment: 75yo female subtherapeutic on heparin with initial dosing for CP; of note heparin was ordered to start yesterday at 1630, day RN did not hang, night RN reports starting gtt at ~2130; some of this level could still be due to bolus 2/2 ESRD.  Goal of Therapy:  Heparin level 0.3-0.7 units/ml   Plan:  Will increase heparin gtt by 2 units/kg/hr to 800 units/hr and check level in IXL, PharmD, BCPS  09/20/2016,4:36 AM

## 2016-09-20 NOTE — Care Management Note (Signed)
Case Management Note  Patient Details  Name: AMEKA KRIGBAUM MRN: [id number] Date of Birth: 1940-10-17  Subjective/Objective:      Pt is now s/p cath              Action/Plan:   PTA independent from home with husband on outpt HD.   Pt is active with [facility] for PT - CM will request for resumption orders via physician sticky tab.  Pt has active PCP and denied barriers to obtaining prescribed medications.  CM informed [facility] of pt admit   Expected Discharge Date:                  Expected Discharge Plan:     In-House Referral:     Discharge planning Services     Post Acute Care Choice:    Choice offered to:     DME Arranged:    DME Agency:     HH Arranged:    HH Agency:     Status of Service:     If discussed at [employer], dates discussed:    Additional Comments:  Maryclare Labrador, RN 09/20/2016, 3:06 PM

## 2016-09-20 NOTE — Interval H&P Note (Signed)
Cath Lab Visit (complete for each Cath Lab visit)  Clinical Evaluation Leading to the Procedure:   ACS: Yes.    Non-ACS:    Anginal Classification: CCS [name]  Anti-ischemic medical therapy: Minimal Therapy (1 class of medications)  Non-Invasive Test Results: Intermediate-risk stress test findings: cardiac mortality 1-3%/year  Prior CABG: No previous CABG      History and Physical Interval Note:  09/20/2016 10:40 AM  Shawna [name]  has presented today for surgery, with the diagnosis of cp  The various methods of treatment have been discussed with the patient and family. After consideration of risks, benefits and other options for treatment, the patient has consented to  Procedure(s): Left Heart Cath and Coronary Angiography (N/A) as a surgical intervention .  The patient's history has been reviewed, patient examined, no change in status, stable for surgery.  I have reviewed the patient's chart and labs.  Questions were answered to the patient's satisfaction.     Shawna [name]

## 2016-09-20 NOTE — Progress Notes (Signed)
Patient ID: Shawna [name], female   DOB: 1940/07/15, 76 y.o.   MRN: [id number]  [facility] Progress Note   Assessment/ Plan:   1. Unstable angina: Premedicated overnight for prior history of contrast allergy and on schedule this morning for coronary angiogram (prior history of CAD status post CABG). Medical management being instituted per cardiology recommendations. 2. End-stage renal disease: Underwent hemodialysis yesterday and will schedule her to undergo dialysis again tomorrow per her usual outpatient schedule unless acute indications emerge overnight. 3. Hypertension: Resume antihypertensive therapy and monitor with ultrafiltration and hemodialysis. 4. Anemia of chronic kidney disease: Iron stores are replete, continue on Aranesp with hemodialysis. 5. Metabolic bone disease: Resume phosphorus binders and reconcile medications for vitamin D receptor analog Subjective:   Reports to be feeling fair-did not have chest pain overnight and somewhat anxious about coronary angiogram this morning    Objective:   BP (!) 142/54   Pulse (!) 59   Temp 98.9 F (37.2 C) (Oral)   Resp (!) 21   Ht 5\' 1"  (1.549 m)   Wt 57.6 kg (126 lb 15.8 oz)   SpO2 98%   BMI 23.99 kg/m   Physical Exam: [id number] resting in bed CVS: Pulse regular rhythm, normal rate Resp: Clear to auscultation, no rales or rhonchi Abd: Soft, flat, nontender Ext: No lower extremity edema  Labs: BMET  Recent Labs Lab 09/19/16 1140 09/20/16 0347  NA 136 135  K 5.3* 4.2  CL 96* 95*  CO2 23 29  GLUCOSE 108* 74  BUN 74* 25*  CREATININE 10.57* 5.63*  CALCIUM 8.3* 9.0   CBC  Recent Labs Lab 09/19/16 1140 09/20/16 0347  WBC 7.3 6.6  HGB 9.8* 10.0*  HCT 30.5* 31.1*  MCV 95.0 95.1  PLT 232 218   Medications:    . amiodarone  200 mg Oral Daily  . aspirin EC  81 mg Oral Daily  . cinacalcet  30 mg Oral Q supper  . cloNIDine  0.2 mg Oral TID  . darbepoetin (ARANESP) injection - DIALYSIS  150  mcg Intravenous Q Mon-HD  . doxycycline  100 mg Oral BID  . heparin  3,500 Units Intravenous Once  . hydrALAZINE  50 mg Oral Q8H  . lanthanum  1,000 mg Oral TID WC  . multivitamin  1 tablet Oral QPM  . pantoprazole  40 mg Oral Daily  . simvastatin  20 mg Oral QHS  . sodium chloride flush  3 mL Intravenous Q12H  . sodium chloride flush  3 mL Intravenous Q12H   Elmarie Shiley, MD 09/20/2016, 10:05 AM

## 2016-09-20 NOTE — Progress Notes (Signed)
Went to see but patient had already left for Cath lab.

## 2016-09-21 DIAGNOSIS — I214 Non-ST elevation (NSTEMI) myocardial infarction: Secondary | ICD-10-CM

## 2016-09-21 LAB — RENAL FUNCTION PANEL
Albumin: 2.9 g/dL — ABNORMAL LOW (ref 3.5–5.0)
Anion gap: 13 (ref 5–15)
BUN: 66 mg/dL — AB (ref 6–20)
CHLORIDE: 93 mmol/L — AB (ref 101–111)
CO2: 24 mmol/L (ref 22–32)
CREATININE: 9.17 mg/dL — AB (ref 0.44–1.00)
Calcium: 7.8 mg/dL — ABNORMAL LOW (ref 8.9–10.3)
GFR calc Af Amer: 4 mL/min — ABNORMAL LOW (ref >60)
GFR calc non Af Amer: 4 mL/min — ABNORMAL LOW (ref >60)
GLUCOSE: 104 mg/dL — AB (ref 65–99)
PHOSPHORUS: 6 mg/dL — AB (ref 2.5–4.6)
POTASSIUM: 5.2 mmol/L — AB (ref 3.5–5.1)
Sodium: 130 mmol/L — ABNORMAL LOW (ref 135–145)

## 2016-09-21 LAB — CBC
HEMATOCRIT: 30.2 % — AB (ref 36.0–46.0)
Hemoglobin: 9.6 g/dL — ABNORMAL LOW (ref 12.0–15.0)
MCH: 30.1 pg (ref 26.0–34.0)
MCHC: 31.8 g/dL (ref 30.0–36.0)
MCV: 94.7 fL (ref 78.0–100.0)
PLATELETS: 232 10*3/uL (ref 150–400)
RBC: 3.19 MIL/uL — ABNORMAL LOW (ref 3.87–5.11)
RDW: 16.2 % — AB (ref 11.5–15.5)
WBC: 9.1 10*3/uL (ref 4.0–10.5)

## 2016-09-21 MED ORDER — CARVEDILOL 3.125 MG PO TABS
3.1250 mg | ORAL_TABLET | Freq: Two times a day (BID) | ORAL | Status: DC
  Administered 2016-09-22: 3.125 mg via ORAL
  Filled 2016-09-21: qty 1

## 2016-09-21 MED ORDER — SODIUM CHLORIDE 0.9% FLUSH: 10.0000 mL | INTRAVENOUS | Status: DC | PRN

## 2016-09-21 MED ORDER — CLONIDINE HCL 0.2 MG PO TABS
0.2000 mg | ORAL_TABLET | Freq: Two times a day (BID) | ORAL | Status: DC
  Filled 2016-09-21 (×2): qty 1

## 2016-09-21 MED ORDER — OXYCODONE-ACETAMINOPHEN 5-325 MG PO TABS
ORAL_TABLET | ORAL | Status: AC
  Administered 2016-09-21: 1 via ORAL
  Filled 2016-09-21: qty 1

## 2016-09-21 MED ORDER — ATORVASTATIN CALCIUM 40 MG PO TABS
40.0000 mg | ORAL_TABLET | Freq: Every day | ORAL | Status: DC
  Administered 2016-09-21: 40 mg via ORAL
  Filled 2016-09-21: qty 1

## 2016-09-21 MED ORDER — ISOSORBIDE MONONITRATE ER 60 MG PO TB24
120.0000 mg | ORAL_TABLET | Freq: Every day | ORAL | Status: DC
  Administered 2016-09-21 – 2016-09-22 (×2): 120 mg via ORAL
  Filled 2016-09-21 (×2): qty 2

## 2016-09-21 MED ORDER — SODIUM CHLORIDE 0.9% FLUSH: 10.0000 mL | Freq: Two times a day (BID) | INTRAVENOUS | Status: DC

## 2016-09-21 MED ORDER — ISOSORBIDE MONONITRATE ER 30 MG PO TB24: 15.0000 mg | ORAL_TABLET | Freq: Every day | ORAL | Status: DC

## 2016-09-21 MED ORDER — ACETAMINOPHEN 325 MG PO TABS
ORAL_TABLET | ORAL | Status: AC
  Filled 2016-09-21: qty 1

## 2016-09-21 NOTE — Progress Notes (Signed)
Patient ID: Shawna [name], female   DOB: 02-18-1941, 76 y.o.   MRN: [id number]  [facility] Progress Note   Assessment/ Plan:   1. Unstable angina: Underwent coronary angiogram yesterday that showed high grade in-stent stenosis of DES in pox-mid diagonal, however, PCI not done due to high rate of re-stenosis. Medical management being instituted per cardiology recommendations including addition of Imdur. 2. End-stage renal disease: Plan for HD today per usual OP schedule. Appears euvolemic on exam and may possibly be able to be DC home later today after HD. 3. Hypertension: Resume antihypertensive therapy and monitor with ultrafiltration and hemodialysis. 4. Anemia of chronic kidney disease: Iron stores are replete, continue on Aranesp with hemodialysis. 5. Metabolic bone disease: Continue phosphorus binders and reconcile medications for vitamin D receptor analog Subjective:   Reports to be feeling better today. Denies any chest pain or shortness of breath.    Objective:   BP 131/72 (BP Location: Left Arm)   Pulse (!) 59   Temp 98.4 F (36.9 C) (Axillary)   Resp 20   Ht 5\' 1"  (1.549 m)   Wt 57.4 kg (126 lb 8.7 oz)   SpO2 99%   BMI 23.91 kg/m   Physical Exam: [id number] resting in bed CVS: Pulse regular rhythm, normal rate Resp: Clear to auscultation, no rales or rhonchi Abd: Soft, flat, nontender Ext: No lower extremity edema  Labs: BMET  Recent Labs Lab 09/19/16 1140 09/20/16 0347 09/20/16 1712  NA 136 135  --   K 5.3* 4.2  --   CL 96* 95*  --   CO2 23 29  --   GLUCOSE 108* 74  --   BUN 74* 25*  --   CREATININE 10.57* 5.63* 7.31*  CALCIUM 8.3* 9.0  --    CBC  Recent Labs Lab 09/19/16 1140 09/20/16 0347 09/20/16 1712 09/21/16 0431  WBC 7.3 6.6 5.2 9.1  HGB 9.8* 10.0* 10.1* 9.6*  HCT 30.5* 31.1* 32.4* 30.2*  MCV 95.0 95.1 95.3 94.7  PLT 232 218 233 232   Medications:    . amiodarone  200 mg Oral Daily  . aspirin EC  81 mg Oral Daily   . cinacalcet  30 mg Oral Q supper  . cloNIDine  0.2 mg Oral TID  . darbepoetin (ARANESP) injection - DIALYSIS  150 mcg Intravenous Q Mon-HD  . doxycycline  100 mg Oral BID  . heparin  5,000 Units Subcutaneous Q8H  . hydrALAZINE  50 mg Oral Q8H  . lanthanum  1,000 mg Oral TID WC  . multivitamin  1 tablet Oral QPM  . pantoprazole  40 mg Oral Daily  . simvastatin  20 mg Oral QHS  . sodium chloride flush  3 mL Intravenous Q12H  . sodium chloride flush  3 mL Intravenous Q12H   Elmarie Shiley, MD 09/21/2016, 10:00 AM

## 2016-09-21 NOTE — Procedures (Signed)
Patient seen on Hemodialysis. QB 400, UF goal 3000 Treatment adjusted as needed.  Elmarie Shiley MD [facility] Office # [phone] Pager # [phone] 3:45 PM

## 2016-09-21 NOTE — Progress Notes (Signed)
DAILY PROGRESS NOTE  Subjective:  Mild chest pain this morning - cath yesterday show high-grade in-stent restenosis of a DES in the proximal diagonal vessel, however, this is a small 2.25 mm stent. PCI of the diagonal was contemplated, however, long-term patency thought to be unlikely due to the small caliber and diffuse stenosis. Medical therapy was recommended. Plan for dialysis today.  Objective:  Temp:  [98 F (36.7 C)-98.8 F (37.1 C)] 98.4 F (36.9 C) (03/14 0758) Pulse Rate:  [0-85] 59 (03/14 0758) Resp:  [10-23] 20 (03/14 0758) BP: (84-186)/(49-115) 131/72 (03/14 0758) SpO2:  [97 %-100 %] 99 % (03/14 0758) Weight:  [126 lb 8.7 oz (57.4 kg)] 126 lb 8.7 oz (57.4 kg) (03/14 0500) Weight change: -3 lb 7.3 oz (-1.568 kg)  Intake/Output from previous day: 03/13 0701 - 03/14 0700 In: 360 [P.O.:360] Out: -   Intake/Output from this shift: No intake/output data recorded.  Medications: No current facility-administered medications on file prior to encounter.    Current Outpatient Prescriptions on File Prior to Encounter  Medication Sig Dispense Refill  . ALPRAZolam (XANAX) 0.25 MG tablet Take 0.25-0.5 mg by mouth at bedtime as needed for sleep.     Marland Kitchen amiodarone (PACERONE) 200 MG tablet Take 1 tablet (200 mg total) by mouth daily. 30 tablet 6  . aspirin EC 81 MG tablet Take 81 mg by mouth daily.     . cloNIDine (CATAPRES) 0.2 MG tablet Take 1 tablet (0.2 mg total) by mouth 3 (three) times daily. 90 tablet 3  . doxycycline (VIBRAMYCIN) 100 MG capsule Take 1 capsule (100 mg total) by mouth 2 (two) times daily. 14 capsule 0  . epoetin alfa (EPOGEN,PROCRIT) 81448 UNIT/ML injection Inject 1 mL (10,000 Units total) into the vein every Monday, Wednesday, and Friday with hemodialysis. 1 mL 10  . fluticasone (FLONASE) 50 MCG/ACT nasal spray Place 2 sprays into the nose daily as needed for allergies.     . hydrALAZINE (APRESOLINE) 50 MG tablet Take 1 tablet (50 mg total) by mouth every  8 (eight) hours. 90 tablet 0  . isosorbide mononitrate (IMDUR) 30 MG 24 hr tablet Take 3 tablets (90 mg total) by mouth daily. 30 tablet 0  . lanthanum (FOSRENOL) 1000 MG chewable tablet Chew 1 tablet (1,000 mg total) by mouth 3 (three) times daily with meals. 90 tablet 1  . multivitamin (RENA-VIT) TABS tablet Take 1 tablet by mouth daily.    Marland Kitchen oxyCODONE-acetaminophen (PERCOCET/ROXICET) 5-325 MG tablet Take 1 tablet by mouth every 6 (six) hours as needed. 20 tablet 0  . pantoprazole (PROTONIX) 40 MG tablet Take 1 tablet (40 mg total) by mouth daily. 30 tablet 1  . PROAIR HFA 108 (90 BASE) MCG/ACT inhaler Inhale 1 puff into the lungs every 6 (six) hours as needed for wheezing or shortness of breath.     . SENSIPAR 30 MG tablet Take 30 mg by mouth daily with supper.     . simvastatin (ZOCOR) 20 MG tablet Take 1 tablet (20 mg total) by mouth at bedtime. 90 tablet 3  . nitroGLYCERIN (NITROSTAT) 0.4 MG SL tablet Place 1 tablet (0.4 mg total) under the tongue every 5 (five) minutes as needed for chest pain. 30 tablet 1    Physical Exam: General appearance: alert and no distress Lungs: clear to auscultation bilaterally Heart: regular rate and rhythm Extremities: extremities normal, atraumatic, no cyanosis or edema Neurologic: Grossly normal  Lab Results: Results for orders placed or performed during the hospital encounter  of 09/19/16 (from the past 48 hour(s))  Basic metabolic panel     Status: Abnormal   Collection Time: 09/19/16 11:40 AM  Result Value Ref Range   Sodium 136 135 - 145 mmol/L   Potassium 5.3 (H) 3.5 - 5.1 mmol/L   Chloride 96 (L) 101 - 111 mmol/L   CO2 23 22 - 32 mmol/L   Glucose, Bld 108 (H) 65 - 99 mg/dL   BUN 74 (H) 6 - 20 mg/dL   Creatinine, Ser 10.57 (H) 0.44 - 1.00 mg/dL   Calcium 8.3 (L) 8.9 - 10.3 mg/dL   GFR calc non Af Amer 3 (L) >60 mL/min   GFR calc Af Amer 4 (L) >60 mL/min    Comment: (NOTE) The eGFR has been calculated using the CKD EPI equation. This  calculation has not been validated in all clinical situations. eGFR's persistently <60 mL/min signify possible Chronic Kidney Disease.    Anion gap 17 (H) 5 - 15  CBC     Status: Abnormal   Collection Time: 09/19/16 11:40 AM  Result Value Ref Range   WBC 7.3 4.0 - 10.5 K/uL   RBC 3.21 (L) 3.87 - 5.11 MIL/uL   Hemoglobin 9.8 (L) 12.0 - 15.0 g/dL   HCT 30.5 (L) 36.0 - 46.0 %   MCV 95.0 78.0 - 100.0 fL   MCH 30.5 26.0 - 34.0 pg   MCHC 32.1 30.0 - 36.0 g/dL   RDW 16.1 (H) 11.5 - 15.5 %   Platelets 232 150 - 400 K/uL  I-stat troponin, ED     Status: None   Collection Time: 09/19/16 11:49 AM  Result Value Ref Range   Troponin i, poc 0.02 0.00 - 0.08 ng/mL   Comment 3            Comment: Due to the release kinetics of cTnI, a negative result within the first hours of the onset of symptoms does not rule out myocardial infarction with certainty. If myocardial infarction is still suspected, repeat the test at appropriate intervals.   Iron and TIBC     Status: Abnormal   Collection Time: 09/19/16  9:24 PM  Result Value Ref Range   Iron 62 28 - 170 ug/dL   TIBC 230 (L) 250 - 450 ug/dL   Saturation Ratios 27 10.4 - 31.8 %   UIBC 168 ug/dL  Ferritin     Status: Abnormal   Collection Time: 09/19/16  9:24 PM  Result Value Ref Range   Ferritin 1,122 (H) 11 - 307 ng/mL  Troponin I     Status: Abnormal   Collection Time: 09/19/16  9:24 PM  Result Value Ref Range   Troponin I 0.07 (HH) <0.03 ng/mL    Comment: CRITICAL RESULT CALLED TO, READ BACK BY AND VERIFIED WITH: POLING,T RN 09/19/2016 2301 JORDANS   MRSA PCR Screening     Status: None   Collection Time: 09/19/16 10:11 PM  Result Value Ref Range   MRSA by PCR NEGATIVE NEGATIVE    Comment:        The GeneXpert MRSA Assay (FDA approved for NASAL specimens only), is one component of a comprehensive MRSA colonization surveillance program. It is not intended to diagnose MRSA infection nor to guide or monitor treatment for MRSA  infections.   Heparin level (unfractionated)     Status: Abnormal   Collection Time: 09/20/16  3:47 AM  Result Value Ref Range   Heparin Unfractionated 0.23 (L) 0.30 - 0.70 IU/mL  Comment:        IF HEPARIN RESULTS ARE BELOW EXPECTED VALUES, AND PATIENT DOSAGE HAS BEEN CONFIRMED, SUGGEST FOLLOW UP TESTING OF ANTITHROMBIN III LEVELS.   Protime-INR     Status: None   Collection Time: 09/20/16  3:47 AM  Result Value Ref Range   Prothrombin Time 14.0 11.4 - 15.2 seconds   INR 4.09   Basic metabolic panel     Status: Abnormal   Collection Time: 09/20/16  3:47 AM  Result Value Ref Range   Sodium 135 135 - 145 mmol/L   Potassium 4.2 3.5 - 5.1 mmol/L    Comment: NO VISIBLE HEMOLYSIS   Chloride 95 (L) 101 - 111 mmol/L   CO2 29 22 - 32 mmol/L   Glucose, Bld 74 65 - 99 mg/dL   BUN 25 (H) 6 - 20 mg/dL   Creatinine, Ser 5.63 (H) 0.44 - 1.00 mg/dL    Comment: DELTA CHECK NOTED   Calcium 9.0 8.9 - 10.3 mg/dL   GFR calc non Af Amer 7 (L) >60 mL/min   GFR calc Af Amer 8 (L) >60 mL/min    Comment: (NOTE) The eGFR has been calculated using the CKD EPI equation. This calculation has not been validated in all clinical situations. eGFR's persistently <60 mL/min signify possible Chronic Kidney Disease.    Anion gap 11 5 - 15  CBC     Status: Abnormal   Collection Time: 09/20/16  3:47 AM  Result Value Ref Range   WBC 6.6 4.0 - 10.5 K/uL   RBC 3.27 (L) 3.87 - 5.11 MIL/uL   Hemoglobin 10.0 (L) 12.0 - 15.0 g/dL   HCT 31.1 (L) 36.0 - 46.0 %   MCV 95.1 78.0 - 100.0 fL   MCH 30.6 26.0 - 34.0 pg   MCHC 32.2 30.0 - 36.0 g/dL   RDW 16.5 (H) 11.5 - 15.5 %   Platelets 218 150 - 400 K/uL  Lipid panel     Status: None   Collection Time: 09/20/16  3:47 AM  Result Value Ref Range   Cholesterol 169 0 - 200 mg/dL   Triglycerides 76 <150 mg/dL   HDL 70 >40 mg/dL   Total CHOL/HDL Ratio 2.4 RATIO   VLDL 15 0 - 40 mg/dL   LDL Cholesterol 84 0 - 99 mg/dL    Comment:        Total Cholesterol/HDL:CHD  Risk Coronary Heart Disease Risk Table                     Men   Women  1/2 Average Risk   3.4   3.3  Average Risk       5.0   4.4  2 X Average Risk   9.6   7.1  3 X Average Risk  23.4   11.0        Use the calculated Patient Ratio above and the CHD Risk Table to determine the patient's CHD Risk.        ATP III CLASSIFICATION (LDL):  <100     mg/dL   Optimal  100-129  mg/dL   Near or Above                    Optimal  130-159  mg/dL   Borderline  160-189  mg/dL   High  >190     mg/dL   Very High   Troponin I     Status: Abnormal   Collection Time: 09/20/16  3:47 AM  Result Value Ref Range   Troponin I 0.12 (HH) <0.03 ng/mL    Comment: CRITICAL VALUE NOTED.  VALUE IS CONSISTENT WITH PREVIOUSLY REPORTED AND CALLED VALUE.  Troponin I     Status: Abnormal   Collection Time: 09/20/16  4:42 PM  Result Value Ref Range   Troponin I 0.06 (HH) <0.03 ng/mL    Comment: CRITICAL VALUE NOTED.  VALUE IS CONSISTENT WITH PREVIOUSLY REPORTED AND CALLED VALUE.  CBC     Status: Abnormal   Collection Time: 09/20/16  5:12 PM  Result Value Ref Range   WBC 5.2 4.0 - 10.5 K/uL   RBC 3.40 (L) 3.87 - 5.11 MIL/uL   Hemoglobin 10.1 (L) 12.0 - 15.0 g/dL   HCT 32.4 (L) 36.0 - 46.0 %   MCV 95.3 78.0 - 100.0 fL   MCH 29.7 26.0 - 34.0 pg   MCHC 31.2 30.0 - 36.0 g/dL   RDW 16.2 (H) 11.5 - 15.5 %   Platelets 233 150 - 400 K/uL  Creatinine, serum     Status: Abnormal   Collection Time: 09/20/16  5:12 PM  Result Value Ref Range   Creatinine, Ser 7.31 (H) 0.44 - 1.00 mg/dL   GFR calc non Af Amer 5 (L) >60 mL/min   GFR calc Af Amer 6 (L) >60 mL/min    Comment: (NOTE) The eGFR has been calculated using the CKD EPI equation. This calculation has not been validated in all clinical situations. eGFR's persistently <60 mL/min signify possible Chronic Kidney Disease.   CBC     Status: Abnormal   Collection Time: 09/21/16  4:31 AM  Result Value Ref Range   WBC 9.1 4.0 - 10.5 K/uL   RBC 3.19 (L) 3.87 - 5.11  MIL/uL   Hemoglobin 9.6 (L) 12.0 - 15.0 g/dL   HCT 30.2 (L) 36.0 - 46.0 %   MCV 94.7 78.0 - 100.0 fL   MCH 30.1 26.0 - 34.0 pg   MCHC 31.8 30.0 - 36.0 g/dL   RDW 16.2 (H) 11.5 - 15.5 %   Platelets 232 150 - 400 K/uL    Imaging: Dg Chest 2 View  Result Date: 09/19/2016 CLINICAL DATA:  Dialysis.  Chest pains. EXAM: CHEST  2 VIEW COMPARISON:  09/07/2016. FINDINGS: Dialysis catheter noted with its tip at the cavoatrial junction. Prior CABG. Cardiomegaly. Near complete clearing of previously identified mild interstitial prominence. Mild bibasilar subsegmental atelectasis. Tiny bilateral pleural effusions. No pneumothorax. IMPRESSION: 1. Dialysis catheter noted is stable position. 2. Cardiomegaly. Previously identified pulmonary interstitial edema has almost completely cleared. Mild bibasilar subsegmental atelectasis . Electronically Signed   By: Marcello Moores  Register   On: 09/19/2016 12:02    Assessment:  1. Principal Problem: 2.   Chronic diastolic CHF (congestive heart failure) ([place]) 3. Active Problems: 4.   Atherosclerosis of native coronary artery of native heart without angina pectoris 5.   ESRD on hemodialysis ([place]) 6.   HLD (hyperlipidemia) 7.   Hx of CABG 8.   Unstable angina ([place]) 9.   Hypertensive emergency 10.   Demand ischemia ([place]) 11.   NSTEMI (non-ST elevated myocardial infarction) ([place]) 12.   Plan:  1. Mild NSTEMI - suspect demand ischemia secondary to labile hypertension and diagonal vessel in-stent restenosis. Although cutting balloon angioplasty was contemplated, long-term patency was questioned and it was not performed. Medical therapy is recommended. Increase Imdur to 120 mg daily.  Not on b-blocker for unknown reasons - was on labetalol in the past, no allergies.  Start coreg 3.125 mg BID - will decrease clonidine to 0.2 mg BID (instead of TID) - plan to ramp up Coreg as tolerated. Dialysis today - anticipate likely d/c tomorrow if BP stable and chest pain  improved.  Time Spent Directly with Patient:  15 minutes  Length of Stay:  LOS: 2 days   Pixie Casino, MD, [facility] Attending Cardiologist [place] 09/21/2016, 10:24 AM

## 2016-09-22 ENCOUNTER — Encounter: Payer: Medicare HMO | Admitting: Adult Health

## 2016-09-22 DIAGNOSIS — I5032 Chronic diastolic (congestive) heart failure: Secondary | ICD-10-CM | POA: Diagnosis not present

## 2016-09-22 DIAGNOSIS — I161 Hypertensive emergency: Secondary | ICD-10-CM | POA: Diagnosis not present

## 2016-09-22 DIAGNOSIS — I214 Non-ST elevation (NSTEMI) myocardial infarction: Secondary | ICD-10-CM | POA: Diagnosis not present

## 2016-09-22 DIAGNOSIS — E7800 Pure hypercholesterolemia, unspecified: Secondary | ICD-10-CM | POA: Diagnosis not present

## 2016-09-22 DIAGNOSIS — Z992 Dependence on renal dialysis: Secondary | ICD-10-CM | POA: Diagnosis not present

## 2016-09-22 DIAGNOSIS — I2510 Atherosclerotic heart disease of native coronary artery without angina pectoris: Secondary | ICD-10-CM

## 2016-09-22 DIAGNOSIS — I200 Unstable angina: Secondary | ICD-10-CM | POA: Diagnosis not present

## 2016-09-22 DIAGNOSIS — Z951 Presence of aortocoronary bypass graft: Secondary | ICD-10-CM | POA: Diagnosis not present

## 2016-09-22 DIAGNOSIS — N186 End stage renal disease: Secondary | ICD-10-CM | POA: Diagnosis not present

## 2016-09-22 LAB — CBC
HEMATOCRIT: 27.2 % — AB (ref 36.0–46.0)
HEMOGLOBIN: 8.7 g/dL — AB (ref 12.0–15.0)
MCH: 30.6 pg (ref 26.0–34.0)
MCHC: 32 g/dL (ref 30.0–36.0)
MCV: 95.8 fL (ref 78.0–100.0)
Platelets: 189 10*3/uL (ref 150–400)
RBC: 2.84 MIL/uL — AB (ref 3.87–5.11)
RDW: 16.5 % — ABNORMAL HIGH (ref 11.5–15.5)
WBC: 6.6 10*3/uL (ref 4.0–10.5)

## 2016-09-22 MED ORDER — CLONIDINE HCL 0.2 MG PO TABS: 0.2000 mg | ORAL_TABLET | Freq: Two times a day (BID) | ORAL | 3 refills | Status: DC

## 2016-09-22 MED ORDER — CARVEDILOL 3.125 MG PO TABS: 3.1250 mg | ORAL_TABLET | Freq: Two times a day (BID) | ORAL | 2 refills | Status: DC

## 2016-09-22 MED ORDER — ATORVASTATIN CALCIUM 40 MG PO TABS: 40.0000 mg | ORAL_TABLET | Freq: Every day | ORAL | 6 refills | Status: DC

## 2016-09-22 MED ORDER — ISOSORBIDE MONONITRATE ER 120 MG PO TB24: 120.0000 mg | ORAL_TABLET | Freq: Every day | ORAL | 2 refills | Status: DC

## 2016-09-22 NOTE — Progress Notes (Signed)
Pt. c/o's chest pain under (L) breast and (L) shoulder; #5 out of #10; Nitro x1 given.

## 2016-09-22 NOTE — Discharge Instructions (Signed)
No driving for 5 days. No lifting over 5 lbs for 1 week. No sexual activity for 1 week.  Keep procedure site clean & dry. If you notice increased pain, swelling, bleeding or pus, call/return!  You may shower, but no soaking baths/hot tubs/pools for 1 week.  ° ° °

## 2016-09-22 NOTE — Progress Notes (Signed)
Pt. refused Clonidine 3/14 pm dose and Hydralazine this 0600 dose; afraid BP will drop too low.

## 2016-09-22 NOTE — Discharge Summary (Signed)
Discharge Summary    Patient ID: [facility]a [name],  MRN: [id number], DOB/AGE: 12-Jan-1941 76 y.o.  Admit date: 09/19/2016 Discharge date: 09/22/2016  Primary Care Provider: [name] Primary Cardiologist: Dr. Bronson [name]   Discharge Diagnoses    Principal Problem:   NSTEMI (non-ST elevated myocardial infarction) [facility]) Active Problems:   Atherosclerosis of native coronary artery of native heart without angina pectoris   ESRD on hemodialysis ([name])   HLD (hyperlipidemia)   Hx of CABG   Chronic diastolic CHF (congestive heart failure) ([name])   Unstable angina ([name])   Hypertensive emergency   Demand ischemia ([name])   Allergies Allergies  Allergen Reactions  . Aspirin Other (See Comments)    High Doses Mess up her stomach; "makes my bowels have blood in them". Takes 81 mg EC Aspirin   . Penicillins Other (See Comments)    SYNCOPE? , "makes me real weak when I take it; like I'll pass out"  Has patient had a PCN reaction causing immediate rash, facial/tongue/throat swelling, SOB or lightheadedness with hypotension: no Has patient had a PCN reaction causing severe rash involving mucus membranes or skin necrosis: no Has patient had a PCN reaction that required hospitalization no Has patient had a PCN reaction occurring within the last 10 years: no If all of the above  . Amlodipine Swelling and Other (See Comments)    Leg swelling  . Bactrim [Sulfamethoxazole-Trimethoprim] Rash  . Contrast Media [Iodinated Diagnostic Agents] Itching  . Iron Itching and Other (See Comments)    "they gave me iron in dialysis; had to give me Benadryl cause I had to have the iron" (05/02/2012)  . Nitrofurantoin Hives  . Prilosec [Omeprazole] Other (See Comments)    "back spasms"  . Tylenol [Acetaminophen] Other (See Comments)    Makes her feet on fire per pt  . Dexilant [Dexlansoprazole] Other (See Comments)    Upset stomach  . Levaquin [Levofloxacin In D5w] Rash  . Morphine And Related  Itching    Itching in feet  . Plavix [Clopidogrel Bisulfate] Rash  . Protonix [Pantoprazole Sodium] Rash  . Venofer [Ferric Oxide] Itching    Patient reports using Benadryl prior to doses as [name]     History of Present Illness     Very pleasant 76 yo female with CAD and prior CABG x 3 in 2013 with patent grafts by cath in 2015. Over the past several months she has had progressive anginal symptoms. In January, she was admitted to [name] and found to have NSTEMI with troponin >2. Myoview showed LVEF 41% with a mostly fixed defect. Medical therapy was recommended. She again had a small NSTEMI in the setting of hypertensive emergency in February. She has required more nitroglycerin use than normal. On Monday (3/12) she had severe chest pain that radiated to her back and jaw. She took 3 nitros and the pain persisted for over an hour so she presented to the ER rather than dialysis. EKG shows either LVH with repolarization changes or inferolateral ischemia. BP on admit was >937 systolic. Initial troponin was negative. Her symptoms were very concerning for unstable angina.   [name]     Consultants: None  She was started on nitroglycerin gtts for hypertensive emergency as well as UA and IV heparin in ED. D/W Dr. Posey Pronto with nephrology and she was dialyzed on (3/12) with plan for left heart cath (3/13) via a femoral approach with Dr. Tamala Julian (scheduled at 1:30 pm).  Cath this  admission showed high grade in-stent restenosis of DES in the proximal diagonal vessel, however it was small 2.25 mm stent. PCI of the diagonal was contemplated by the long-term patency thought to be unlikely due to small caliber and diffuse stenosis. She also has hx of GI bleeding and reports allergies to aspirin and Plavix. She was dialyzed again on Wednesday. The plan is for aggressive control of her blood pressure with pharmacotherapy. Patient stayed an extra day in [name] due to poor BP control. Adjustments to  her BP medications were made; Imdur increased to 120 mg daily, start coreg 3.125 BID. Decreased Clonidine to 0.2 mg BID (instead of TID). Titrate Coreg up as tolerated.   She has been seen by Dr. Debara Pickett today, blood pressure well controlled, right femoral catheter site is stable and she isdeemed ready for discharge home.  All follow-up appointments have been scheduled. Recheck CBC and BMP to make sure hemoglobi and potassium are stable and f/u. Discharge medications are listed below. She will resume aspirin in the setting of severe CAD, I had a discussion with the patient where she reports that she has been taking Aspirin at home, she denies having any allergic reaction to it but has hx of GI bleeding. Has received it during this admission as well. She has been advised to monitor stools closely.   The [place] office does not have any available appts with any teams within the next 1-2 weeks. Pt has previously scheduled appointments on the 29 th so I will try to schedule her [facility] appointment to allign with this date so she only has to arrange transportation once.   Hemoglobin  (3/13)  10.1  (3/14)   9.6   (3/15)   8.7    Potassium (3/14)  5.2   dialyzed same day    _____________  Discharge Vitals Blood pressure 99/62, pulse 68, temperature 98.1 F (36.7 C), temperature source Oral, resp. rate 18, height 5\' 1"  (1.549 m), weight 125 lb 7.1 oz (56.9 kg), SpO2 99 %.  Filed Weights   09/21/16 1511 09/21/16 1921 09/22/16 0300  Weight: 131 lb 13.4 oz (59.8 kg) 126 lb 5.2 oz (57.3 kg) 125 lb 7.1 oz (56.9 kg)    Labs & Radiologic Studies    CBC  Recent Labs  09/21/16 0431 09/22/16 0516  WBC 9.1 6.6  HGB 9.6* 8.7*  HCT 30.2* 27.2*  MCV 94.7 95.8  PLT 232 956   Basic Metabolic Panel  Recent Labs  09/20/16 0347 09/20/16 1712 09/21/16 1530  NA 135  --  130*  K 4.2  --  5.2*  CL 95*  --  93*  CO2 29  --  24  GLUCOSE 74  --  104*  BUN 25*  --  66*  CREATININE 5.63* 7.31* 9.17*    CALCIUM 9.0  --  7.8*  PHOS  --   --  6.0*   Liver Function Tests  Recent Labs  09/21/16 1530  ALBUMIN 2.9*   Cardiac Enzymes  Recent Labs  09/19/16 2124 09/20/16 0347 09/20/16 1642  TROPONINI 0.07* 0.12* 0.06*   Fasting Lipid Panel  Recent Labs  09/20/16 0347  CHOL 169  HDL 70  LDLCALC 84  TRIG 76  CHOLHDL 2.4    Dg Chest 2 View Result Date: 09/19/2016 1. Dialysis catheter noted is stable position. 2. Cardiomegaly. Previously identified pulmonary interstitial edema has almost completely cleared. Mild bibasilar subsegmental atelectasis.  Dg Chest 2 View Result Date: 09/07/2016  1. Vascular congestion and  borderline cardiomegaly. Bibasilar airspace opacities may reflect mild interstitial edema. Small bilateral pleural effusions seen. 2. Scattered aortic atherosclerosis.   Diagnostic Studies/Procedures    Left Heart Cath and Coronary Angiography _____________  Conclusion    Widely patent left internal mammary graft to LAD  Three widely patent saphenous vein grafts including to 1) distal RCA, 2) distal circumflex, and 3) ramus intermedius.  Total occlusion of the ostial RCA due to in-stent restenosis.  80% proximal/ostial ramus intermedius  99% proximal circumflex  60-70% proximal to mid LAD and 80% distal LAD  High-grade in-stent restenosis of the 2.25 mm drug-eluting stent in the proximal to mid diagonal. The lesion extends from the proximal third of the stent into the proximal and ostial segment of the native vessel. Perhaps angina is coming from this territory. The diagonal is moderate in size. Restenting this segment would be associated with very high restenosis rate given the small size. The best treatment option is medical therapy.  Low normal to mildly depressed LV function with EF 40-45%. Mid anterior wall hypokinesis. LVEDP 25 mmHg.  RECOMMENDATIONS:   Aggressive medical therapy of blood pressure.  Repeat PCI on the diagonal was  contemplated but long-term patency is anticipated to be very low given the small caliber of the vessel and diffuse in-stent restenosis. Additionally the patient has had GI bleeding and reported allergies to aspirin and Plavix. For these varied reasons, medical therapy seems to be the most prudent treatment in this situation.     Disposition   Pt is being discharged home today in good condition.  Follow-up Plans & Appointments   Follow-up Information    Lyda Jester, PA-C Follow up on 10/06/2016.   Specialties:  Cardiology, Radiology Why:  Your appointment is at 11:30 am, please arrive 15 minutes early. You also have an appointment with the vascular this same day. Contact information: [address] [place] [address] [phone]            Discharge Medications    Allergies as of 09/22/2016      Reactions   Aspirin Other (See Comments)   High Doses Mess up her stomach; "makes my bowels have blood in them". Takes 81 mg EC Aspirin    Penicillins Other (See Comments)   SYNCOPE? , "makes me real weak when I take it; like I'll pass out" Has patient had a PCN reaction causing immediate rash, facial/tongue/throat swelling, SOB or lightheadedness with hypotension: no Has patient had a PCN reaction causing severe rash involving mucus membranes or skin necrosis: no Has patient had a PCN reaction that required hospitalization no Has patient had a PCN reaction occurring within the last 10 years: no If all of the above   Amlodipine Swelling, Other (See Comments)   Leg swelling   Bactrim [sulfamethoxazole-trimethoprim] Rash   Contrast Media [iodinated Diagnostic Agents] Itching   Iron Itching, Other (See Comments)   "they gave me iron in dialysis; had to give me Benadryl cause I had to have the iron" (05/02/2012)   Nitrofurantoin Hives   Prilosec [omeprazole] Other (See Comments)   "back spasms"   Tylenol [acetaminophen] Other (See Comments)   Makes her feet on  fire per pt   Dexilant [dexlansoprazole] Other (See Comments)   Upset stomach   Levaquin [levofloxacin In D5w] Rash   Morphine And Related Itching   Itching in feet   Plavix [clopidogrel Bisulfate] Rash   Protonix [pantoprazole Sodium] Rash   Venofer [ferric Oxide] Itching   Patient reports  using Benadryl prior to doses as [name]      Medication List    STOP taking these medications   doxycycline 100 MG capsule Commonly known as:  VIBRAMYCIN     TAKE these medications   ALPRAZolam 0.25 MG tablet Commonly known as:  XANAX Take 0.25-0.5 mg by mouth at bedtime as needed for sleep.   amiodarone 200 MG tablet Commonly known as:  PACERONE Take 1 tablet (200 mg total) by mouth daily.   aspirin EC 81 MG tablet Take 81 mg by mouth daily.   atorvastatin 40 MG tablet Commonly known as:  LIPITOR Take 1 tablet (40 mg total) by mouth daily at 6 PM.   carvedilol 3.125 MG tablet Commonly known as:  COREG Take 1 tablet (3.125 mg total) by mouth 2 (two) times daily with a meal.   cloNIDine 0.2 MG tablet Commonly known as:  CATAPRES Take 1 tablet (0.2 mg total) by mouth 2 (two) times daily. What changed:  when to take this   epoetin alfa 10000 UNIT/ML injection Commonly known as:  EPOGEN,PROCRIT Inject 1 mL (10,000 Units total) into the vein every Monday, Wednesday, and Friday with hemodialysis.   fluticasone 50 MCG/ACT nasal spray Commonly known as:  FLONASE Place 2 sprays into the nose daily as needed for allergies.   hydrALAZINE 50 MG tablet Commonly known as:  APRESOLINE Take 1 tablet (50 mg total) by mouth every 8 (eight) hours.   isosorbide mononitrate 120 MG 24 hr tablet Commonly known as:  IMDUR Take 1 tablet (120 mg total) by mouth daily. What changed:  medication strength  how much to take   lanthanum 1000 MG chewable tablet Commonly known as:  FOSRENOL Chew 1 tablet (1,000 mg total) by mouth 3 (three) times daily with meals.   multivitamin Tabs  tablet Take 1 tablet by mouth daily.   nitroGLYCERIN 0.4 MG SL tablet Commonly known as:  NITROSTAT Place 1 tablet (0.4 mg total) under the tongue every 5 (five) minutes as needed for chest pain.   oxyCODONE-acetaminophen 5-325 MG tablet Commonly known as:  PERCOCET/ROXICET Take 1 tablet by mouth every 6 (six) hours as needed.   pantoprazole 40 MG tablet Commonly known as:  PROTONIX Take 1 tablet (40 mg total) by mouth daily.   PROAIR HFA 108 (90 Base) MCG/ACT inhaler Generic drug:  albuterol Inhale 1 puff into the lungs every 6 (six) hours as needed for wheezing or shortness of breath.   SENSIPAR 30 MG tablet Generic drug:  cinacalcet Take 30 mg by mouth daily with supper.   simvastatin 20 MG tablet Commonly known as:  ZOCOR Take 1 tablet (20 mg total) by mouth at bedtime.        Outstanding Labs/Studies   CBC and BMP   Duration of Discharge Encounter   Greater than 30 minutes including physician time.  Kristopher Glee PA-C 09/22/2016, 12:11 PM

## 2016-09-23 DIAGNOSIS — J0101 Acute recurrent maxillary sinusitis: Secondary | ICD-10-CM | POA: Diagnosis not present

## 2016-09-23 DIAGNOSIS — Z6825 Body mass index (BMI) 25.0-25.9, adult: Secondary | ICD-10-CM | POA: Diagnosis not present

## 2016-09-23 DIAGNOSIS — Z992 Dependence on renal dialysis: Secondary | ICD-10-CM | POA: Diagnosis not present

## 2016-09-23 DIAGNOSIS — N186 End stage renal disease: Secondary | ICD-10-CM | POA: Diagnosis not present

## 2016-09-26 ENCOUNTER — Encounter: Payer: Self-pay | Admitting: *Deleted

## 2016-09-26 DIAGNOSIS — Z992 Dependence on renal dialysis: Secondary | ICD-10-CM | POA: Diagnosis not present

## 2016-09-26 DIAGNOSIS — N186 End stage renal disease: Secondary | ICD-10-CM | POA: Diagnosis not present

## 2016-09-28 ENCOUNTER — Ambulatory Visit (INDEPENDENT_AMBULATORY_CARE_PROVIDER_SITE_OTHER): Payer: Medicare HMO | Admitting: Cardiovascular Disease

## 2016-09-28 ENCOUNTER — Encounter: Payer: Self-pay | Admitting: Cardiovascular Disease

## 2016-09-28 VITALS — BP 180/82 | HR 62 | Ht 61.0 in | Wt 128.0 lb

## 2016-09-28 DIAGNOSIS — Z9289 Personal history of other medical treatment: Secondary | ICD-10-CM | POA: Diagnosis not present

## 2016-09-28 DIAGNOSIS — N186 End stage renal disease: Secondary | ICD-10-CM

## 2016-09-28 DIAGNOSIS — I25768 Atherosclerosis of bypass graft of coronary artery of transplanted heart with other forms of angina pectoris: Secondary | ICD-10-CM

## 2016-09-28 DIAGNOSIS — I10 Essential (primary) hypertension: Secondary | ICD-10-CM | POA: Diagnosis not present

## 2016-09-28 DIAGNOSIS — I6523 Occlusion and stenosis of bilateral carotid arteries: Secondary | ICD-10-CM | POA: Diagnosis not present

## 2016-09-28 DIAGNOSIS — Z992 Dependence on renal dialysis: Secondary | ICD-10-CM | POA: Diagnosis not present

## 2016-09-28 DIAGNOSIS — E7800 Pure hypercholesterolemia, unspecified: Secondary | ICD-10-CM | POA: Diagnosis not present

## 2016-09-28 DIAGNOSIS — I5032 Chronic diastolic (congestive) heart failure: Secondary | ICD-10-CM | POA: Diagnosis not present

## 2016-09-28 MED ORDER — HYDRALAZINE HCL 50 MG PO TABS: 75.0000 mg | ORAL_TABLET | Freq: Three times a day (TID) | ORAL | 6 refills | Status: DC

## 2016-09-28 NOTE — Patient Instructions (Signed)
Medication Instructions:   Increase Hydralazine to 75mg  three times per day.  Continue all other medications.    Labwork: none  Testing/Procedures: none  Follow-Up: 3 months   Any Other Special Instructions Will Be Listed Below (If Applicable).  If you need a refill on your cardiac medications before your next appointment, please call your pharmacy.

## 2016-09-28 NOTE — Progress Notes (Signed)
SUBJECTIVE: The patient resents her follow-up of coronary artery disease and malignant hypertension. She underwent coronary angiography on 09/20/16 for a NSTEMI which demonstrated high-grade in-stent restenosis of the 2.25 mm drug-eluting stent in the proximal to mid diagonal. The lesion extends from the proximal third of the stent into the proximal and ostial segment of the native vessel. The diagonal is moderate in size. It was felt that restenting this segment would be associated with very high restenosis rate given the small size. The best treatment option is medical therapy.  Coronary angiography also demonstrated a widely patent left internal mammary graft to the LAD and saphenous vein grafts to the distal RCA, distal circumflex, ramus intermedius.  Her blood pressure has been elevated ever since her hospitalization. At dialysis today her systolic blood pressure was 220 mmHg. When it is elevated she develops an upset stomach.   Review of Systems: As per "subjective", otherwise negative.  Allergies  Allergen Reactions  . Aspirin Other (See Comments)    High Doses Mess up her stomach; "makes my bowels have blood in them". Takes 81 mg EC Aspirin   . Penicillins Other (See Comments)    SYNCOPE? , "makes me real weak when I take it; like I'll pass out"  Has patient had a PCN reaction causing immediate rash, facial/tongue/throat swelling, SOB or lightheadedness with hypotension: no Has patient had a PCN reaction causing severe rash involving mucus membranes or skin necrosis: no Has patient had a PCN reaction that required hospitalization no Has patient had a PCN reaction occurring within the last 10 years: no If all of the above  . Amlodipine Swelling and Other (See Comments)    Leg swelling  . Bactrim [Sulfamethoxazole-Trimethoprim] Rash  . Contrast Media [Iodinated Diagnostic Agents] Itching  . Iron Itching and Other (See Comments)    "they gave me iron in dialysis; had to give  me Benadryl cause I had to have the iron" (05/02/2012)  . Nitrofurantoin Hives  . Prilosec [Omeprazole] Other (See Comments)    "back spasms"  . Tylenol [Acetaminophen] Other (See Comments)    Makes her feet on fire per pt  . Dexilant [Dexlansoprazole] Other (See Comments)    Upset stomach  . Levaquin [Levofloxacin In D5w] Rash  . Morphine And Related Itching    Itching in feet  . Plavix [Clopidogrel Bisulfate] Rash  . Protonix [Pantoprazole Sodium] Rash  . Venofer [Ferric Oxide] Itching    Patient reports using Benadryl prior to doses as [facility]    Current Outpatient Prescriptions  Medication Sig Dispense Refill  . ALPRAZolam (XANAX) 0.25 MG tablet Take 0.25-0.5 mg by mouth at bedtime as needed for sleep.     Marland Kitchen amiodarone (PACERONE) 200 MG tablet Take 1 tablet (200 mg total) by mouth daily. 30 tablet 6  . aspirin EC 81 MG tablet Take 81 mg by mouth daily.     Marland Kitchen atorvastatin (LIPITOR) 40 MG tablet Take 1 tablet (40 mg total) by mouth daily at 6 PM. 30 tablet 6  . carvedilol (COREG) 3.125 MG tablet Take 1 tablet (3.125 mg total) by mouth 2 (two) times daily with a meal. 60 tablet 2  . cloNIDine (CATAPRES) 0.2 MG tablet Take 1 tablet (0.2 mg total) by mouth 2 (two) times daily. 60 tablet 3  . epoetin alfa (EPOGEN,PROCRIT) 84132 UNIT/ML injection Inject 1 mL (10,000 Units total) into the vein every Monday, Wednesday, and Friday with hemodialysis. 1 mL 10  . fluticasone (FLONASE)  50 MCG/ACT nasal spray Place 2 sprays into the nose daily as needed for allergies.     . hydrALAZINE (APRESOLINE) 50 MG tablet Take 1 tablet (50 mg total) by mouth every 8 (eight) hours. 90 tablet 0  . isosorbide mononitrate (IMDUR) 120 MG 24 hr tablet Take 1 tablet (120 mg total) by mouth daily. 30 tablet 2  . lanthanum (FOSRENOL) 1000 MG chewable tablet Chew 1 tablet (1,000 mg total) by mouth 3 (three) times daily with meals. 90 tablet 1  . multivitamin (RENA-VIT) TABS tablet Take 1 tablet by mouth daily.     . nitroGLYCERIN (NITROSTAT) 0.4 MG SL tablet Place 1 tablet (0.4 mg total) under the tongue every 5 (five) minutes as needed for chest pain. 30 tablet 1  . oxyCODONE-acetaminophen (PERCOCET/ROXICET) 5-325 MG tablet Take 1 tablet by mouth every 6 (six) hours as needed. 20 tablet 0  . pantoprazole (PROTONIX) 40 MG tablet Take 1 tablet (40 mg total) by mouth daily. 30 tablet 1  . PROAIR HFA 108 (90 BASE) MCG/ACT inhaler Inhale 1 puff into the lungs every 6 (six) hours as needed for wheezing or shortness of breath.     . SENSIPAR 30 MG tablet Take 30 mg by mouth daily with supper.     . simvastatin (ZOCOR) 20 MG tablet Take 1 tablet (20 mg total) by mouth at bedtime. 90 tablet 3   No current facility-administered medications for this visit.     Past Medical History:  Diagnosis Date  . Anxiety   . Arthritis   . AVM (arteriovenous malformation) of colon   . CAD (coronary artery disease)    a. 12/2011 NSTEMI/Cath/PCI LCX (2.25x14 Resolute DES) & D1 (2.25x22 Resolute DES);  b. 01/2012 Cath/PCI: LM 30, LAD 30p, 40-49m, D1 stent ok, 99 in sm branch of diag, LCX patent stent, OM1 20, RCA 95 ost (4.0x12 Promus DES), EF 55%;  c. 04/2012 Lexi Cardiolite  EF 48%, small area of scar @ base/mid inflat wall with mild peri-infarct ischemia.; CABG 12/4  . Carotid artery disease ([place])    a. 53-29% LICA, 03/2425   . Chronic bronchitis ([place])   . Chronic diastolic CHF (congestive heart failure) ([place])    a. 02/2012 Echo EF 60-65%, nl wall motion, Gr 1 DD, mod MR  . Colon cancer ([place]) 1992  . Esophageal stricture   . ESRD on hemodialysis ([place])    ESRD due to HTN, started dialysis 2011 and gets HD at [facility] with Dr Hinda Lenis on MWF schedule.  Access is LUA AVF as of Sept 2014.   Marland Kitchen GERD (gastroesophageal reflux disease)   . High cholesterol 12/2011  . History of blood transfusion 07/2011; 12/2011; 01/2012 X 2; 04/2012  . History of gout   . History of lower GI bleeding   . Hypertension   . Iron deficiency anemia    . Mitral regurgitation    a. Moderate by echo, 02/2012  . Myocardial infarction   . Ovarian cancer ([place]) 1992  . Pneumonia ~ 2009  . PUD (peptic ulcer disease)   . TIA (transient ischemic attack)     Past Surgical History:  Procedure Laterality Date  . ABDOMINAL HYSTERECTOMY  1992  . APPENDECTOMY  06/1990  . AV FISTULA PLACEMENT  07/2009   left upper arm  . AV FISTULA PLACEMENT Right 09/06/2016   Procedure: RIGHT FOREARM ARTERIOVENOUS (AV) GRAFT;  Surgeon: Elam Dutch, MD;  Location: [place];  Service: Vascular;  Laterality: Right;  . AVGG REMOVAL  Right 09/06/2016   Procedure: REMOVAL OF Right Arm ARTERIOVENOUS GORETEX GRAFT and Vein Patch angioplasty of brachial artery;  Surgeon: Angelia Mould, MD;  Location: [place];  Service: Vascular;  Laterality: Right;  . COLON RESECTION  1992  . CORONARY ANGIOPLASTY WITH STENT PLACEMENT  12/15/11   "2"  . CORONARY ANGIOPLASTY WITH STENT PLACEMENT  y/2013   "1; makes total of 3" (05/02/2012)  . CORONARY ARTERY BYPASS GRAFT  06/13/2012   Procedure: CORONARY ARTERY BYPASS GRAFTING (CABG);  Surgeon: Grace Isaac, MD;  Location: [place];  Service: Open Heart Surgery;  Laterality: N/A;  cabg x four;  using left internal mammary artery, and left leg greater saphenous vein harvested endoscopically  . DILATION AND CURETTAGE OF UTERUS    . ESOPHAGOGASTRODUODENOSCOPY  01/20/2012   Procedure: ESOPHAGOGASTRODUODENOSCOPY (EGD);  Surgeon: Ladene Artist, MD,FACG;  Location: [facility] ENDOSCOPY;  Service: Endoscopy;  Laterality: N/A;  . ESOPHAGOGASTRODUODENOSCOPY N/A 03/26/2013   Procedure: ESOPHAGOGASTRODUODENOSCOPY (EGD);  Surgeon: Irene Shipper, MD;  Location: [facility] ENDOSCOPY;  Service: Endoscopy;  Laterality: N/A;  . ESOPHAGOGASTRODUODENOSCOPY N/A 04/30/2015   Procedure: ESOPHAGOGASTRODUODENOSCOPY (EGD);  Surgeon: Rogene Houston, MD;  [facility];  Service: Endoscopy;  Laterality: N/A;  1pm - moved to 10/20 @ 1:10  . ESOPHAGOGASTRODUODENOSCOPY N/A  07/29/2016   Procedure: ESOPHAGOGASTRODUODENOSCOPY (EGD);  Surgeon: Manus Gunning, MD;  Location: [place];  Service: Gastroenterology;  Laterality: N/A;  enteroscopy  . INTRAOPERATIVE TRANSESOPHAGEAL ECHOCARDIOGRAM  06/13/2012   Procedure: INTRAOPERATIVE TRANSESOPHAGEAL ECHOCARDIOGRAM;  Surgeon: Grace Isaac, MD;  Location: [place];  Service: Open Heart Surgery;  Laterality: N/A;  . IR GENERIC HISTORICAL  07/26/2016   IR FLUORO GUIDE CV LINE RIGHT 07/26/2016 Greggory Keen, MD [facility]  . IR GENERIC HISTORICAL  07/26/2016   IR US GUIDE VASC ACCESS RIGHT 07/26/2016 Greggory Keen, MD [facility]  . IR GENERIC HISTORICAL  08/02/2016   IR US GUIDE VASC ACCESS RIGHT 08/02/2016 Greggory Keen, MD [facility]  . IR GENERIC HISTORICAL  08/02/2016   IR FLUORO GUIDE CV LINE RIGHT 08/02/2016 Greggory Keen, MD [facility]  . LEFT HEART CATH AND CORONARY ANGIOGRAPHY N/A 09/20/2016   Procedure: Left Heart Cath and Coronary Angiography;  Surgeon: [name], MD;  Location: [name] CV LAB;  Service: Cardiovascular;  Laterality: N/A;  . LEFT HEART CATHETERIZATION WITH CORONARY ANGIOGRAM N/A 12/15/2011   Procedure: LEFT HEART CATHETERIZATION WITH CORONARY ANGIOGRAM;  Surgeon: Burnell Blanks, MD;  Location[facility] CATH LAB;  Service: Cardiovascular;  Laterality: N/A;  . LEFT HEART CATHETERIZATION WITH CORONARY ANGIOGRAM N/A 01/10/2012   Procedure: LEFT HEART CATHETERIZATION WITH CORONARY ANGIOGRAM;  Surgeon: Peter M Martinique, MD;  Location: [facility] CATH LAB;  Service: Cardiovascular;  Laterality: N/A;  . LEFT HEART CATHETERIZATION WITH CORONARY ANGIOGRAM N/A 06/08/2012   Procedure: LEFT HEART CATHETERIZATION WITH CORONARY ANGIOGRAM;  Surgeon: Burnell Blanks, MD;  Location: [facility] CATH LAB;  Service: Cardiovascular;  Laterality: N/A;  . LEFT HEART CATHETERIZATION WITH CORONARY/GRAFT ANGIOGRAM N/A 12/10/2013   Procedure: LEFT HEART CATHETERIZATION WITH Beatrix Fetters;  Surgeon: [name], MD;  Location: [facility] CATH LAB;  Service: Cardiovascular;  Laterality: N/A;  . OVARY SURGERY     ovarian cancer  . SHUNTOGRAM N/A 10/15/2013   Procedure: Fistulogram;  Surgeon: Serafina Mitchell, MD;  Location: [facility] CATH LAB;  Service: Cardiovascular;  Laterality: N/A;  . THROMBECTOMY / ARTERIOVENOUS GRAFT REVISION  2011   left upper arm  . TUBAL LIGATION  1980's  Social History   Social History  . Marital status: Married    Spouse name: N/A  . Number of children: N/A  . Years of education: N/A   Occupational History  . Not on file.   Social History Main Topics  . Smoking status: Never Smoker  . Smokeless tobacco: Never Used  . Alcohol use No  . Drug use: No  . Sexual activity: Yes    Birth control/ protection: Surgical   Other Topics Concern  . Not on file   Social History Narrative   Lives in [place], [place] with husband.  Dialysis pt - mwf.     Vitals:   09/28/16 1422  BP: (!) 180/82  Pulse: 62  SpO2: 98%  Weight: 128 lb (58.1 kg)  Height: 5\' 1"  (1.549 m)    PHYSICAL EXAM General: NAD HEENT: Normal. Neck: No JVD, no thyromegaly. Lungs: Clear to auscultation bilaterally with normal respiratory effort. CV: Nondisplaced PMI. Regular rate and rhythm, normal S1/S2, no S3/S4, soft aortic systolic murmur. No pretibial or periankle edema.  Abdomen: Soft, nontender, no distention.  Neurologic: Alert and oriented.  Psych: Normal affect. Skin: Normal. Musculoskeletal: No gross deformities.    ECG: Most recent ECG reviewed.      ASSESSMENT AND PLAN: 1. CAD with h/o 4-vessel CABG: Coronary angiogram reviewed above. Continue aspirin, Coreg, nitrates, and statin. Aim to control BP.   2. Malignant HTN: Markedly elevated. Increase hydralazine to 75 mg tid. Also on clonidine 0.2 mg twice daily and Coreg 3.125 mg twice daily.  3. Hyperlipidemia: Continue statin therapy.  4. Mitral regurgitation: Mild on 07/28/2016 (had been moderate in 09/2015). Will  monitor.  5. Carotid artery stenosis: Stable. Continue surveillance monitoring. Continue ASA and statin.  6. PSVT: Stable on amiodarone. No changes.  Dispo: f/u 33months.   Kate Sable, M.D., F.A.C.C.

## 2016-09-29 ENCOUNTER — Ambulatory Visit (INDEPENDENT_AMBULATORY_CARE_PROVIDER_SITE_OTHER): Payer: Self-pay | Admitting: Vascular Surgery

## 2016-09-29 ENCOUNTER — Other Ambulatory Visit: Payer: Self-pay

## 2016-09-29 ENCOUNTER — Encounter: Payer: Self-pay | Admitting: Vascular Surgery

## 2016-09-29 VITALS — BP 198/82 | HR 56 | Temp 98.0°F | Resp 20 | Ht 61.0 in | Wt 129.0 lb

## 2016-09-29 DIAGNOSIS — Z992 Dependence on renal dialysis: Secondary | ICD-10-CM

## 2016-09-29 DIAGNOSIS — N186 End stage renal disease: Secondary | ICD-10-CM

## 2016-09-29 NOTE — Progress Notes (Signed)
CC: ESRD   HPI: Shawna [name] is a 76 y.o. female who has been on HD through  Right tunneled catheter.  Shawna [name] previously had a left brachiocephalic fistula that lasted for about 7 years but recently occluded.  Shawna [name] recently underwent right forearm AV graft placement because there were no suitable antecubital veins for outflow.  With in a graft hours the graft had to be removed secondary to sever steal.  Shawna [name] states her right hand has fully recovered.  Shawna [name] denise numbness, weakness or coolness to touch.Shawna [name] is here today to plan new access.    Shawna [name] was recently in the hospital with a GI bleed and also had a non-ST MI. This was about one week ago. Shawna [name] states Shawna [name] feels better at this point. Shawna [name] has had no further bleeding. Shawna [name] denies any current chest pain or shortness of breath. Other medical problems include AVMs of the colon, coronary artery disease, elevated cholesterol mitral regurgitation, peripheral arterial disease followed by [place], no significant carotid occlusive disease on recent ultrasound. Her dialysis today is Monday Wednesday Friday.  Past Medical History:  Diagnosis Date  . Anxiety   . Arthritis   . AVM (arteriovenous malformation) of colon   . CAD (coronary artery disease)    a. 12/2011 NSTEMI/Cath/PCI LCX (2.25x14 Resolute DES) & D1 (2.25x22 Resolute DES);  b. 01/2012 Cath/PCI: LM 30, LAD 30p, 40-46m, D1 stent ok, 99 in sm branch of diag, LCX patent stent, OM1 20, RCA 95 ost (4.0x12 Promus DES), EF 55%;  c. 04/2012 Lexi Cardiolite  EF 48%, small area of scar @ base/mid inflat wall with mild peri-infarct ischemia.; CABG 12/4  . Carotid artery disease ([place])    a. 48-01% LICA, 12/5535   . Chronic bronchitis ([place])   . Chronic diastolic CHF (congestive heart failure) ([place])    a. 02/2012 Echo EF 60-65%, nl wall motion, Gr 1 DD, mod MR  . Colon cancer ([place]) 1992  . Esophageal stricture   . ESRD on hemodialysis ([place])    ESRD due to HTN, started dialysis 2011 and gets HD at [facility] with Dr  Hinda Lenis on MWF schedule.  Access is LUA AVF as of Sept 2014.   Marland Kitchen GERD (gastroesophageal reflux disease)   . High cholesterol 12/2011  . History of blood transfusion 07/2011; 12/2011; 01/2012 X 2; 04/2012  . History of gout   . History of lower GI bleeding   . Hypertension   . Iron deficiency anemia   . Mitral regurgitation    a. Moderate by echo, 02/2012  . Myocardial infarction   . Ovarian cancer ([place]) 1992  . Pneumonia ~ 2009  . PUD (peptic ulcer disease)   . TIA (transient ischemic attack)     Family Hx Family History  Problem Relation Age of Onset  . Heart disease Mother     Heart Disease before age 16  . Hyperlipidemia Mother   . Hypertension Mother   . Diabetes Mother   . Heart attack Mother   . Heart disease Father     Heart Disease before age 36  . Hyperlipidemia Father   . Hypertension Father   . Diabetes Father   . Diabetes Sister   . Hypertension Sister   . Diabetes Brother   . Hyperlipidemia Brother   . Heart attack Brother   . Hypertension Sister   . Heart attack Brother   . Other      noncontributory for early CAD  . Colon cancer Neg  Hx   . Esophageal cancer Neg Hx   . Liver disease Neg Hx   . Kidney disease Neg Hx   . Colon polyps Neg Hx     Social HX Social History  Substance Use Topics  . Smoking status: Never Smoker  . Smokeless tobacco: Never Used  . Alcohol use No    Allergies Allergies  Allergen Reactions  . Aspirin Other (See Comments)    High Doses Mess up her stomach; "makes my bowels have blood in them". Takes 81 mg EC Aspirin   . Penicillins Other (See Comments)    SYNCOPE? , "makes me real weak when I take it; like I'll pass out"  Has patient had a PCN reaction causing immediate rash, facial/tongue/throat swelling, SOB or lightheadedness with hypotension: no Has patient had a PCN reaction causing severe rash involving mucus membranes or skin necrosis: no Has patient had a PCN reaction that required hospitalization no Has  patient had a PCN reaction occurring within the last 10 years: no If all of the above  . Amlodipine Swelling and Other (See Comments)    Leg swelling  . Bactrim [Sulfamethoxazole-Trimethoprim] Rash  . Contrast Media [Iodinated Diagnostic Agents] Itching  . Iron Itching and Other (See Comments)    "they gave me iron in dialysis; had to give me Benadryl cause I had to have the iron" (05/02/2012)  . Nitrofurantoin Hives  . Prilosec [Omeprazole] Other (See Comments)    "back spasms"  . Tylenol [Acetaminophen] Other (See Comments)    Makes her feet on fire per pt  . Dexilant [Dexlansoprazole] Other (See Comments)    Upset stomach  . Levaquin [Levofloxacin In D5w] Rash  . Morphine And Related Itching    Itching in feet  . Plavix [Clopidogrel Bisulfate] Rash  . Protonix [Pantoprazole Sodium] Rash  . Venofer [Ferric Oxide] Itching    Patient reports using Benadryl prior to doses as [facility]    Medications Current Outpatient Prescriptions  Medication Sig Dispense Refill  . ALPRAZolam (XANAX) 0.25 MG tablet Take 0.25-0.5 mg by mouth at bedtime as needed for sleep.     Marland Kitchen amiodarone (PACERONE) 200 MG tablet Take 1 tablet (200 mg total) by mouth daily. 30 tablet 6  . aspirin EC 81 MG tablet Take 81 mg by mouth daily.     Marland Kitchen atorvastatin (LIPITOR) 40 MG tablet Take 1 tablet (40 mg total) by mouth daily at 6 PM. 30 tablet 6  . carvedilol (COREG) 3.125 MG tablet Take 1 tablet (3.125 mg total) by mouth 2 (two) times daily with a meal. 60 tablet 2  . cloNIDine (CATAPRES) 0.2 MG tablet Take 1 tablet (0.2 mg total) by mouth 2 (two) times daily. 60 tablet 3  . epoetin alfa (EPOGEN,PROCRIT) 09983 UNIT/ML injection Inject 1 mL (10,000 Units total) into the vein every Monday, Wednesday, and Friday with hemodialysis. 1 mL 10  . fluticasone (FLONASE) 50 MCG/ACT nasal spray Place 2 sprays into the nose daily as needed for allergies.     . hydrALAZINE (APRESOLINE) 50 MG tablet Take 1.5 tablets (75 mg  total) by mouth 3 (three) times daily. 135 tablet 6  . isosorbide mononitrate (IMDUR) 120 MG 24 hr tablet Take 1 tablet (120 mg total) by mouth daily. 30 tablet 2  . lanthanum (FOSRENOL) 1000 MG chewable tablet Chew 1 tablet (1,000 mg total) by mouth 3 (three) times daily with meals. 90 tablet 1  . multivitamin (RENA-VIT) TABS tablet Take 1 tablet by mouth daily.    Marland Kitchen  nitroGLYCERIN (NITROSTAT) 0.4 MG SL tablet Place 1 tablet (0.4 mg total) under the tongue every 5 (five) minutes as needed for chest pain. 30 tablet 1  . pantoprazole (PROTONIX) 40 MG tablet Take 1 tablet (40 mg total) by mouth daily. 30 tablet 1  . PROAIR HFA 108 (90 BASE) MCG/ACT inhaler Inhale 1 puff into the lungs every 6 (six) hours as needed for wheezing or shortness of breath.     . SENSIPAR 30 MG tablet Take 30 mg by mouth daily with supper.     . simvastatin (ZOCOR) 20 MG tablet Take 1 tablet (20 mg total) by mouth at bedtime. 90 tablet 3   No current facility-administered medications for this visit.     Labs COAG Lab Results  Component Value Date   INR 1.07 09/20/2016   INR 0.96 09/07/2016   INR 1.11 08/02/2016   No results found for: PTT  PHYSICAL EXAM  Vitals:   09/29/16 1530  BP: (!) 198/82  Pulse: (!) 56  Resp: 20  Temp: 98 F (36.7 C)    General:  WDWN in NAD HENT: WNL Eyes: Pupils equal Pulmonary: normal non-labored breathing  Cardiac: RRR, Skin: normal, no cyanosis, jaundice, pallor or bruising Vascular Exam/Pulses: 2+ radial and brachial pulses in LEFT upper extremity. Extremities without ischemic changes, no Gangrene , no cellulitis; no open wounds;   There is residual occluded left cephalic fistula. Hand grip is 5/5 and sensation in digits is intact B UE   Impression: This is a 76 y.o. female who has a functioning HD access.  Plan:  Left UE AF graft and ligation of occluded left av cephalic fistula. Shawna [name] will have her graft placed Mon. 10/03/2016 by Dr. Oneida Alar and then go to HD in [place]  that afternoon.  Her husband drives her.     Laurence Slate [facility] 09/29/2016 3:53 PM  The patient was seen in conjunction with Dr. Oneida Alar today  Agree with above. Patient recently developed a steel in her right arm. Shawna [name] required graft removal the same day. Shawna [name] previously had occluded a left brachiocephalic AV fistula. I would prefer not to go back into the right arm because her steal was so significant. I believe the best option at this point would be to place a left arm AV graft. We will also ligate the proximal portion of her old fistula during this same procedure. This will be scheduled for next Monday, March 26 and reschedule her dialysis for the afternoon that day.  Ruta Hinds, MD [facility] Office: [phone] Pager: [phone]

## 2016-09-30 ENCOUNTER — Encounter (HOSPITAL_COMMUNITY): Payer: Self-pay | Admitting: Emergency Medicine

## 2016-09-30 ENCOUNTER — Encounter (HOSPITAL_COMMUNITY): Payer: Self-pay | Admitting: *Deleted

## 2016-09-30 DIAGNOSIS — N186 End stage renal disease: Secondary | ICD-10-CM | POA: Diagnosis not present

## 2016-09-30 DIAGNOSIS — Z992 Dependence on renal dialysis: Secondary | ICD-10-CM | POA: Diagnosis not present

## 2016-09-30 NOTE — Progress Notes (Signed)
Anesthesia Chart Review:  Pt is a same day work up.   Pt is a 76 year old female scheduled for ligation of L AV fistula, insertion of AV Gore-Tex graft on 10/03/2016 with Ruta Hinds, M.D.  - PCP is [name], MD - Cardiologist is Kate Sable, MD, last office visit 09/28/16.   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 Never smoker. BMI 24.  Pt hospitalized 3/12-3/15/18 for NSTEMI, unstable angina, hypertensive emergency, demand ischemia, chronic CHF.  Pt hospitalized 2/27-09/08/16 for chest pain following placement of AV graft with removal of AV graft a few hours later for steal syndrome  Pt hospitalized 1/17-1/23/18 for hypertensive emergency complicated by demand ischemia, ESRD, acute on chronic diastolic HF, GI bleed due to duodenal AVM (s/p ablation).   Meds include Xanax, amiodarone, amlodipine, aspirin 81 mg, Catapres, Epogen, Flonase, hydralazine, Imdur, Fosrenol, Rena-Vit, nitroglycerin, Protonix, ProAir HFA, Sensipar, Zocor.  Labs will be obtained DOS. Pt was anemic, H/H 8.7/27.2 (down from high 13.3/40.7 on 09/07/16) during last hospitalization on 09/22/16.   CXR 09/19/16:  1. Dialysis catheter noted is stable position. 2. Cardiomegaly. Previously identified pulmonary interstitial edema has almost completely cleared. Mild bibasilar subsegmental atelectasis .  EKG 09/19/16: NSR. Possible Left atrial enlargement. Incomplete LBBB. LVH with repolarization abnormality  Cardiac cath 09/20/16:   Widely patent LIMA graft to LAD  Three widely patent SVGs including to 1) distal RCA, 2)  distal circumflex, and 3) ramus intermedius.  Total occlusion of the ostial RCA due to in-stent restenosis.  80% proximal/ostial ramus intermedius  99% proximal circumflex  60-70% proximal to mid LAD and 80% distal LAD  High-grade in-stent restenosis of the 2.25 mm DES in the proximal to mid diagonal. The lesion extends from the proximal third of the stent into the proximal and ostial segment of the native vessel. Perhaps angina is coming from this territory. The diagonal is moderate in size. Restenting this segment would be associated with very high restenosis rate given the small size. The best treatment option is medical therapy.  Low normal to mildly depressed LV function with EF 40-45%. Mid anterior wall hypokinesis. LVEDP 25 mmHg. RECOMMENDATIONS: Aggressive medical therapy of blood pressure.  Nuclear stress test 07/28/16:  1. There is a primarily fixed defect in the anterior wall and apex which is moderate in size. A small portion of it towards the base is reversible. 2. Global hypokinesis. 3. Left ventricular ejection fraction 41% 4. Non invasive risk stratification: intermediate risk.   Echo 07/28/16:  - Left ventricle: Septal and apical hypokinesis. The cavity sizewas normal. Wall thickness was normal. Systolic function wasnormal. The estimated ejection fraction was in the range of 50%to 55%. Doppler parameters are consistent with both elevatedventricular end-diastolic filling pressure and elevated leftatrial filling pressure. - Mitral valve: There was mild regurgitation. Valve area by continuity equation (using LVOT flow): 1.49 cm^2. - Left atrium: The atrium was moderately dilated. - Atrial septum: No defect or patent foramen ovale was identified. - Pulmonary arteries: PA peak pressure: 54 mm Hg (S).  Carotid duplex 08/20/15: B ICA with <40% stenosis  If labs acceptable DOS, I anticipate pt can proceed with surgery as  scheduled.   Willeen Cass, FNP-BC [facility] Stay  S[facility]/Anesthesiology Phone: [phone] 09/30/2016 3:23 PM

## 2016-09-30 NOTE — Progress Notes (Signed)
Spoke with Dr. Nyoka Cowden, Anesthesia, regarding pt C/O chest pain. MD advised that pt go to ED and make cardiologist aware. Pt made aware of the following and stated " I have been taking my Nitroglycerin and it eases off, it only hurts when I move around." I reiterated that cardiologist should be made aware and to consider going to the ED if experiencing chest pain. Pt verbalized understanding.

## 2016-09-30 NOTE — Progress Notes (Signed)
Pt denies SOB but stated that she continues to have chest pain. Pt stated " it hurts more since I had that surgery." Pt stated, " it goes around my back and shoulder." Pt made aware to stop taking vitamins, fish oil and herbal medications. Do not take any NSAIDs ie: Ibuprofen, Advil, Naproxen, BC and Goody Powder. Pt verbalized understanding of all pre-op instructions. Anesthesia asked to review pt history ( see note).

## 2016-10-02 ENCOUNTER — Encounter (HOSPITAL_COMMUNITY): Payer: Self-pay

## 2016-10-02 DIAGNOSIS — I5032 Chronic diastolic (congestive) heart failure: Secondary | ICD-10-CM | POA: Diagnosis not present

## 2016-10-02 DIAGNOSIS — Z85038 Personal history of other malignant neoplasm of large intestine: Secondary | ICD-10-CM | POA: Insufficient documentation

## 2016-10-02 DIAGNOSIS — I132 Hypertensive heart and chronic kidney disease with heart failure and with stage 5 chronic kidney disease, or end stage renal disease: Secondary | ICD-10-CM | POA: Insufficient documentation

## 2016-10-02 DIAGNOSIS — R1011 Right upper quadrant pain: Secondary | ICD-10-CM | POA: Diagnosis not present

## 2016-10-02 DIAGNOSIS — Z992 Dependence on renal dialysis: Secondary | ICD-10-CM | POA: Insufficient documentation

## 2016-10-02 DIAGNOSIS — Z8543 Personal history of malignant neoplasm of ovary: Secondary | ICD-10-CM | POA: Diagnosis not present

## 2016-10-02 DIAGNOSIS — Z8673 Personal history of transient ischemic attack (TIA), and cerebral infarction without residual deficits: Secondary | ICD-10-CM | POA: Insufficient documentation

## 2016-10-02 DIAGNOSIS — I252 Old myocardial infarction: Secondary | ICD-10-CM | POA: Insufficient documentation

## 2016-10-02 DIAGNOSIS — Z79899 Other long term (current) drug therapy: Secondary | ICD-10-CM | POA: Diagnosis not present

## 2016-10-02 DIAGNOSIS — Z951 Presence of aortocoronary bypass graft: Secondary | ICD-10-CM | POA: Insufficient documentation

## 2016-10-02 DIAGNOSIS — N185 Chronic kidney disease, stage 5: Secondary | ICD-10-CM | POA: Diagnosis not present

## 2016-10-02 DIAGNOSIS — Z7982 Long term (current) use of aspirin: Secondary | ICD-10-CM | POA: Diagnosis not present

## 2016-10-02 DIAGNOSIS — N186 End stage renal disease: Secondary | ICD-10-CM | POA: Diagnosis not present

## 2016-10-02 DIAGNOSIS — I120 Hypertensive chronic kidney disease with stage 5 chronic kidney disease or end stage renal disease: Secondary | ICD-10-CM | POA: Diagnosis not present

## 2016-10-02 DIAGNOSIS — K828 Other specified diseases of gallbladder: Secondary | ICD-10-CM | POA: Diagnosis not present

## 2016-10-02 DIAGNOSIS — I25810 Atherosclerosis of coronary artery bypass graft(s) without angina pectoris: Secondary | ICD-10-CM | POA: Insufficient documentation

## 2016-10-02 DIAGNOSIS — J9811 Atelectasis: Secondary | ICD-10-CM | POA: Diagnosis not present

## 2016-10-02 MED ORDER — VANCOMYCIN HCL IN DEXTROSE 1-5 GM/200ML-% IV SOLN: 1000.0000 mg | INTRAVENOUS | Status: DC

## 2016-10-02 NOTE — Anesthesia Preprocedure Evaluation (Deleted)
Anesthesia Evaluation    Reviewed: Allergy & Precautions, H&P , Patient's Chart, lab work & pertinent test results  Airway        Dental   Pulmonary neg pulmonary ROS,           Cardiovascular Exercise Tolerance: Good hypertension, Pt. on medications and Pt. on home beta blockers + CAD, + Past MI, + Cardiac Stents, + Peripheral Vascular Disease and +CHF  negative cardio ROS       Neuro/Psych Anxiety TIAnegative neurological ROS  negative psych ROS   GI/Hepatic negative GI ROS, Neg liver ROS, PUD, GERD  Medicated and Controlled,  Endo/Other  negative endocrine ROS  Renal/GU ESRF and DialysisRenal diseasenegative Renal ROS  negative genitourinary   Musculoskeletal  (+) Arthritis , Osteoarthritis,    Abdominal   Peds  Hematology negative hematology ROS (+) anemia ,   Anesthesia Other Findings   Reproductive/Obstetrics negative OB ROS                             Anesthesia Physical Anesthesia Plan  ASA: III  Anesthesia Plan: MAC   Post-op Pain Management:    Induction: Intravenous  Airway Management Planned: Simple Face Mask  Additional Equipment:   Intra-op Plan:   Post-operative Plan:   Informed Consent: I have reviewed the patients History and Physical, chart, labs and discussed the procedure including the risks, benefits and alternatives for the proposed anesthesia with the patient or authorized representative who has indicated his/her understanding and acceptance.   Dental advisory given  Plan Discussed with: CRNA  Anesthesia Plan Comments:         Anesthesia Quick Evaluation

## 2016-10-02 NOTE — ED Triage Notes (Signed)
Pt complaining of R lower chest pain. Pt states radiates to abdomen. Pt on dialysis, MWF. Had full treatment Friday. Pt denies any N/V.

## 2016-10-03 ENCOUNTER — Other Ambulatory Visit: Payer: Self-pay

## 2016-10-03 ENCOUNTER — Ambulatory Visit (HOSPITAL_COMMUNITY): Admission: RE | Admit: 2016-10-03 | Payer: Medicare HMO | Source: Ambulatory Visit | Admitting: Vascular Surgery

## 2016-10-03 ENCOUNTER — Emergency Department (HOSPITAL_COMMUNITY): Payer: Medicare HMO

## 2016-10-03 ENCOUNTER — Emergency Department (HOSPITAL_COMMUNITY)
Admission: EM | Admit: 2016-10-03 | Discharge: 2016-10-03 | Disposition: A | Discharge status: Home / Self Care | Payer: Medicare HMO | Attending: Emergency Medicine | Admitting: Emergency Medicine

## 2016-10-03 DIAGNOSIS — R1011 Right upper quadrant pain: Secondary | ICD-10-CM

## 2016-10-03 DIAGNOSIS — Z85038 Personal history of other malignant neoplasm of large intestine: Secondary | ICD-10-CM | POA: Diagnosis not present

## 2016-10-03 DIAGNOSIS — K828 Other specified diseases of gallbladder: Secondary | ICD-10-CM | POA: Diagnosis not present

## 2016-10-03 DIAGNOSIS — Z951 Presence of aortocoronary bypass graft: Secondary | ICD-10-CM | POA: Diagnosis not present

## 2016-10-03 DIAGNOSIS — N186 End stage renal disease: Secondary | ICD-10-CM | POA: Diagnosis not present

## 2016-10-03 DIAGNOSIS — Z992 Dependence on renal dialysis: Secondary | ICD-10-CM | POA: Diagnosis not present

## 2016-10-03 DIAGNOSIS — I10 Essential (primary) hypertension: Secondary | ICD-10-CM

## 2016-10-03 DIAGNOSIS — I5032 Chronic diastolic (congestive) heart failure: Secondary | ICD-10-CM | POA: Diagnosis not present

## 2016-10-03 DIAGNOSIS — Z7982 Long term (current) use of aspirin: Secondary | ICD-10-CM | POA: Diagnosis not present

## 2016-10-03 DIAGNOSIS — N185 Chronic kidney disease, stage 5: Secondary | ICD-10-CM

## 2016-10-03 DIAGNOSIS — I252 Old myocardial infarction: Secondary | ICD-10-CM | POA: Diagnosis not present

## 2016-10-03 DIAGNOSIS — I25810 Atherosclerosis of coronary artery bypass graft(s) without angina pectoris: Secondary | ICD-10-CM | POA: Diagnosis not present

## 2016-10-03 DIAGNOSIS — J9811 Atelectasis: Secondary | ICD-10-CM | POA: Diagnosis not present

## 2016-10-03 DIAGNOSIS — Z8543 Personal history of malignant neoplasm of ovary: Secondary | ICD-10-CM | POA: Diagnosis not present

## 2016-10-03 DIAGNOSIS — I132 Hypertensive heart and chronic kidney disease with heart failure and with stage 5 chronic kidney disease, or end stage renal disease: Secondary | ICD-10-CM | POA: Diagnosis not present

## 2016-10-03 LAB — BASIC METABOLIC PANEL
Anion gap: 20 — ABNORMAL HIGH (ref 5–15)
BUN: 78 mg/dL — ABNORMAL HIGH (ref 6–20)
CALCIUM: 9.1 mg/dL (ref 8.9–10.3)
CO2: 21 mmol/L — AB (ref 22–32)
Chloride: 92 mmol/L — ABNORMAL LOW (ref 101–111)
Creatinine, Ser: 10 mg/dL — ABNORMAL HIGH (ref 0.44–1.00)
GFR, EST AFRICAN AMERICAN: 4 mL/min — AB (ref >60)
GFR, EST NON AFRICAN AMERICAN: 3 mL/min — AB (ref >60)
GLUCOSE: 115 mg/dL — AB (ref 65–99)
Potassium: 5.3 mmol/L — ABNORMAL HIGH (ref 3.5–5.1)
Sodium: 133 mmol/L — ABNORMAL LOW (ref 135–145)

## 2016-10-03 LAB — LIPASE, BLOOD: Lipase: 17 U/L (ref 11–51)

## 2016-10-03 LAB — CBC
HCT: 30.3 % — ABNORMAL LOW (ref 36.0–46.0)
Hemoglobin: 10 g/dL — ABNORMAL LOW (ref 12.0–15.0)
MCH: 31.3 pg (ref 26.0–34.0)
MCHC: 33 g/dL (ref 30.0–36.0)
MCV: 95 fL (ref 78.0–100.0)
Platelets: 217 10*3/uL (ref 150–400)
RBC: 3.19 MIL/uL — AB (ref 3.87–5.11)
RDW: 18.1 % — ABNORMAL HIGH (ref 11.5–15.5)
WBC: 9.3 10*3/uL (ref 4.0–10.5)

## 2016-10-03 LAB — HEPATIC FUNCTION PANEL
ALK PHOS: 85 U/L (ref 38–126)
ALT: 27 U/L (ref 14–54)
AST: 29 U/L (ref 15–41)
Albumin: 3.4 g/dL — ABNORMAL LOW (ref 3.5–5.0)
BILIRUBIN DIRECT: 0.1 mg/dL (ref 0.1–0.5)
BILIRUBIN TOTAL: 0.3 mg/dL (ref 0.3–1.2)
Indirect Bilirubin: 0.2 mg/dL — ABNORMAL LOW (ref 0.3–0.9)
Total Protein: 6.2 g/dL — ABNORMAL LOW (ref 6.5–8.1)

## 2016-10-03 LAB — I-STAT TROPONIN, ED: TROPONIN I, POC: 0.02 ng/mL (ref 0.00–0.08)

## 2016-10-03 IMAGING — CR DG CHEST 2V
2 series · 2 of 2 positions shown · non-contrast
Comparison: Chest radiograph performed [DATE]

CLINICAL DATA: Acute onset of high blood pressure and headache.
Initial encounter.

EXAM:
CHEST  2 VIEW

[chest pa]
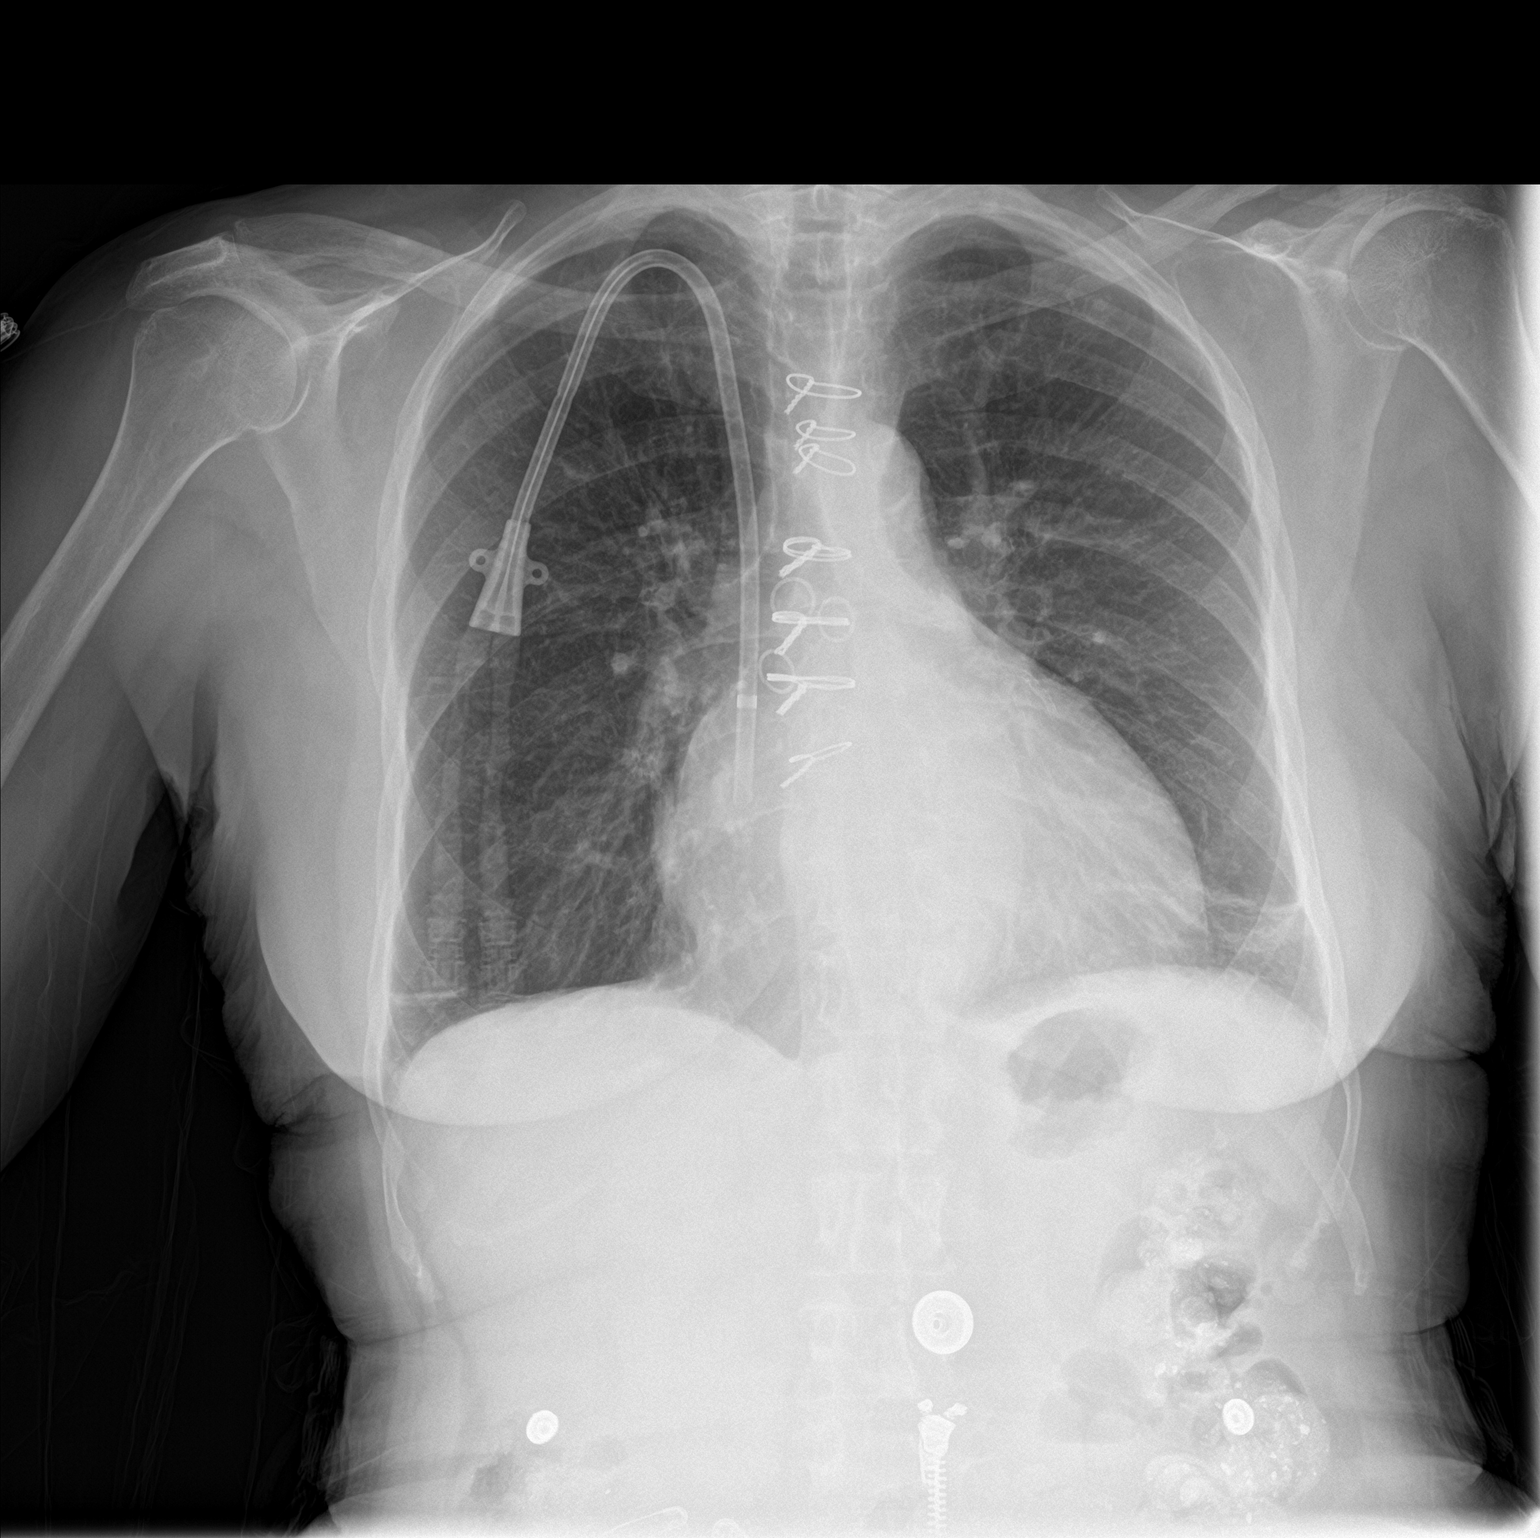

[chest lat]
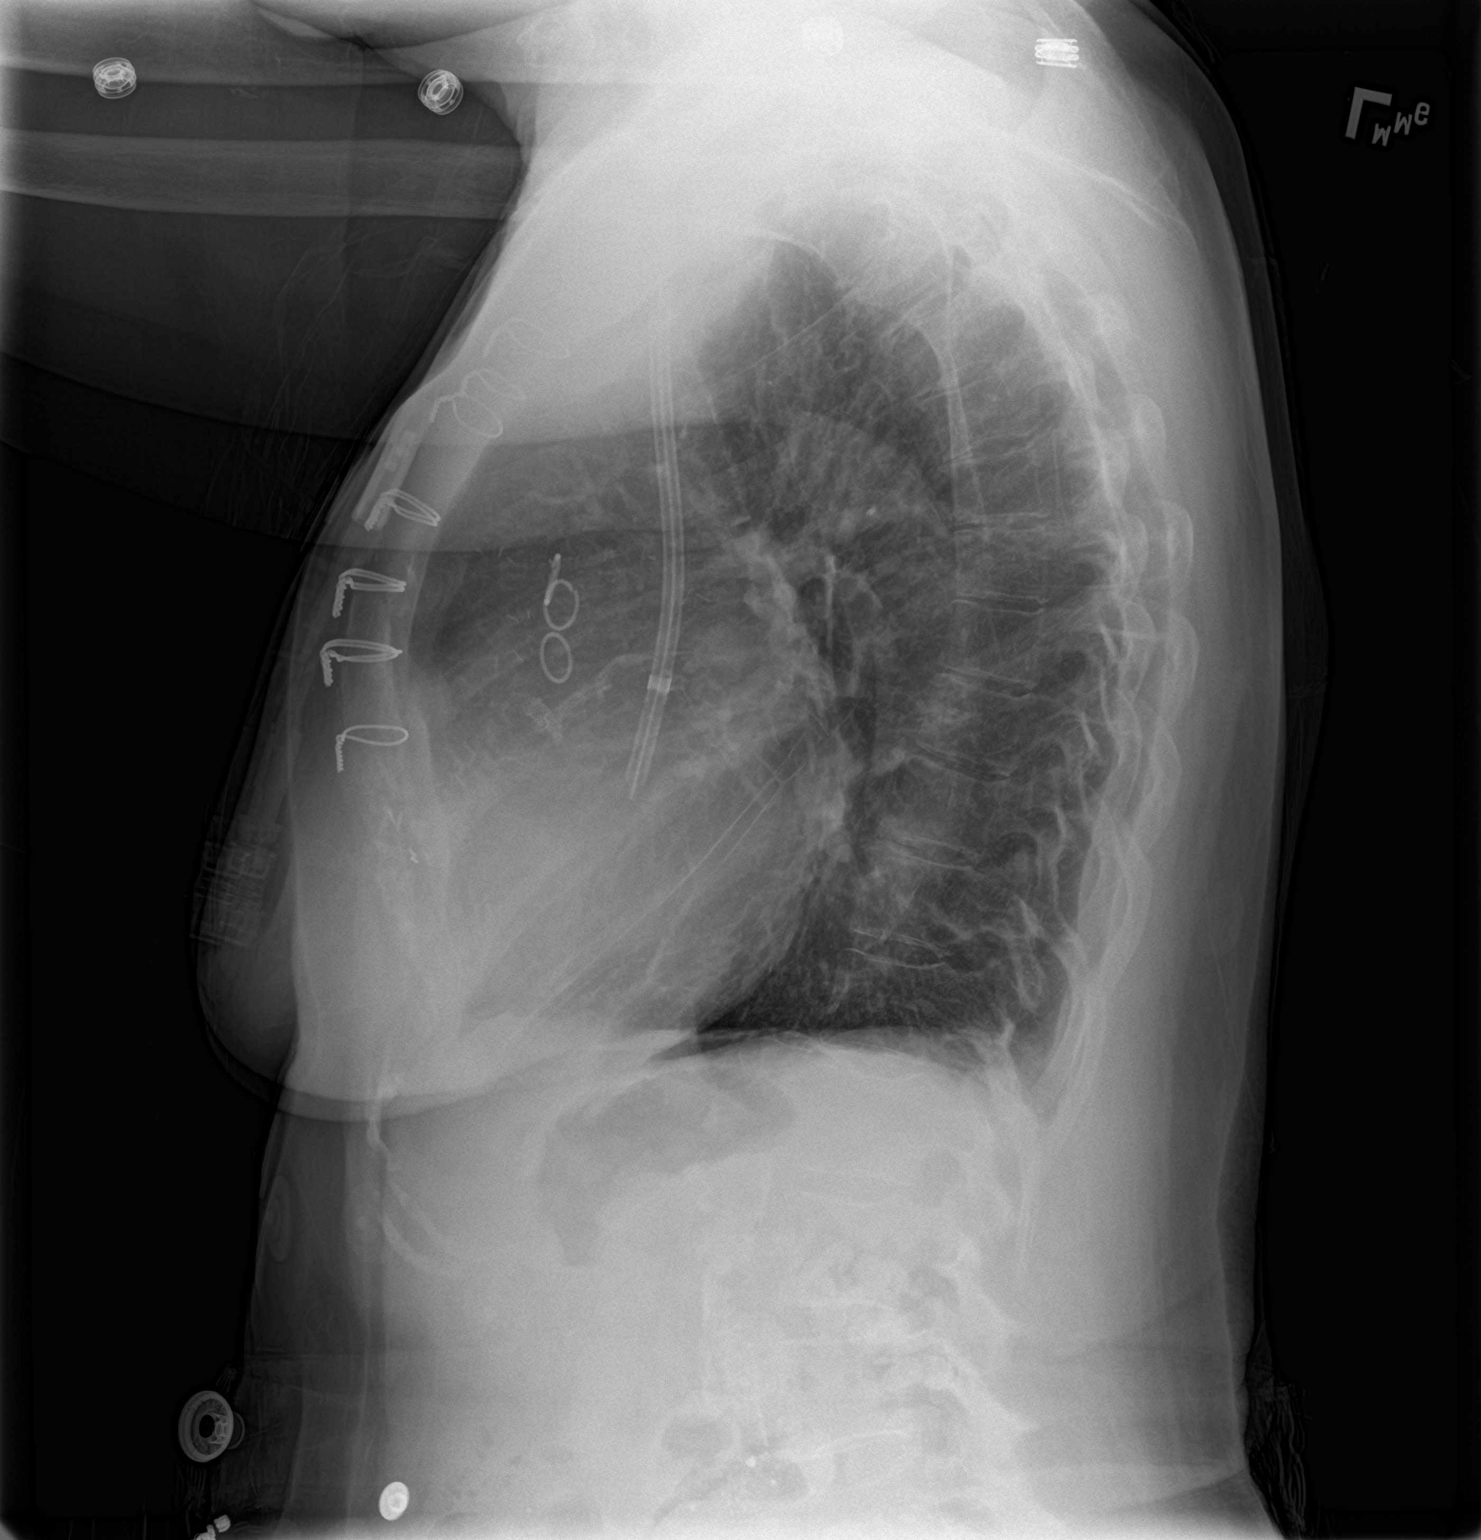

[2 of 2 positions shown; findings below may reference images not displayed]

FINDINGS: The lungs are well-aerated. Mild left basilar atelectasis is again
noted. There is no evidence of focal opacification, pleural effusion
or pneumothorax.

The heart is borderline enlarged. The patient is status post median
sternotomy, with evidence of prior CABG. No acute osseous
abnormalities are seen. A right-sided dual-lumen catheter is noted
ending about the cavoatrial junction. Scattered calcification is
seen along the proximal abdominal aorta.
IMPRESSION: 1. Mild left basilar atelectasis again noted. Lungs otherwise clear.
2. Borderline cardiomegaly.
3. Scattered aortic atherosclerosis.

## 2016-10-03 SURGERY — LIGATION OF ARTERIOVENOUS  FISTULA
Anesthesia: Choice | Laterality: Left

## 2016-10-03 NOTE — ED Notes (Signed)
Patient transported to [facility] 

## 2016-10-03 NOTE — ED Notes (Signed)
Per pt she called and spoke with short stay to let them know she will not be coming to her procedure today

## 2016-10-03 NOTE — ED Notes (Signed)
Dr. Wickline at bedside.  

## 2016-10-03 NOTE — ED Notes (Signed)
Per Dr. [name] pt will be unable to make her surgical appointment today and is requesting surgical to be notified.

## 2016-10-03 NOTE — Discharge Instructions (Signed)
Your caregiver has diagnosed you as having chest pain that is not specific for one problem, but does not require admission.  Chest pain comes from many different causes.  °SEEK IMMEDIATE MEDICAL ATTENTION IF: °You have severe chest pain, especially if the pain is crushing or pressure-like and spreads to the arms, back, neck, or jaw, or if you have sweating, nausea (feeling sick to your stomach), or shortness of breath. THIS IS AN EMERGENCY. Don't wait to see if the pain will go away. Get medical help at once. Call 911 or [phone] (operator). DO NOT drive yourself to the hospital.  °Your chest pain gets worse and does not go away with rest.  °You have an attack of chest pain lasting longer than usual, despite rest and treatment with the medications your caregiver has prescribed.  °You wake from sleep with chest pain or shortness of breath.  °You feel dizzy or faint.  °You have chest pain not typical of your usual pain for which you originally saw your caregiver. ° °

## 2016-10-03 NOTE — ED Provider Notes (Signed)
[facility] DEPT Provider Note   CSN: [id number] Arrival date & time: 10/02/16  2350  By signing my name below, I, Oleh Genin, attest that this documentation has been prepared under the direction and in the presence of Ripley Fraise, MD. Electronically Signed: Oleh Genin, Scribe. 10/03/16. 1:45 AM.   History   Chief Complaint Chief Complaint  Patient presents with  . Chest Pain  . Abdominal Pain    HPI Shawna [name] is a 76 y.o. female history of NSTEMI, ESRD on HD MWF, HTN, diastolic HF, and CAD with CABG who presents to the ED for evaluation of abdominal pain. This patient states that approximately 12 hours ago she developed epigastric/RUQ abdominal pain with nausea that "goes up into my chest, jaw, and upper back". She took sublingual nitro several hours ago which "made her pain go away", however it comes back "whenever she gets up and does things". At interview currently, she denies any pain symptoms. When asked, she does state that she has had these symptoms "occasionally in the last 2 weeks". No fever, vomiting, or rectal bleeding. No focal weaknesses. No leg pain. She had full treatment at dialysis today. Of note, the patient was seen at this facility on 3/12 with chest pain where she underwent L heart catheterization.  The history is provided by the patient. No language interpreter was used.  Abdominal Pain   This is a new problem. The current episode started 6 to 12 hours ago. The problem occurs hourly. The problem has been resolved. The pain is located in the epigastric region and RUQ. Associated symptoms include nausea. Pertinent negatives include fever and vomiting.    Past Medical History:  Diagnosis Date  . Anxiety   . Arthritis   . AVM (arteriovenous malformation) of colon   . CAD (coronary artery disease)    a. 12/2011 NSTEMI/Cath/PCI LCX (2.25x14 Resolute DES) & D1 (2.25x22 Resolute DES);  b. 01/2012 Cath/PCI: LM 30, LAD 30p, 40-57m, D1 stent ok, 99 in sm  branch of diag, LCX patent stent, OM1 20, RCA 95 ost (4.0x12 Promus DES), EF 55%;  c. 04/2012 Lexi Cardiolite  EF 48%, small area of scar @ base/mid inflat wall with mild peri-infarct ischemia.; CABG 12/4  . Carotid artery disease ([place])    a. 81-44% LICA, 02/1855   . Chronic bronchitis ([place])   . Chronic diastolic CHF (congestive heart failure) ([name])    a. 02/2012 Echo EF 60-65%, nl wall motion, Gr 1 DD, mod MR  . Colon cancer ([place]) 1992  . Esophageal stricture   . ESRD on hemodialysis ([place])    ESRD due to HTN, started dialysis 2011 and gets HD at [facility] with Dr Hinda Lenis on MWF schedule.  Access is LUA AVF as of Sept 2014.   Marland Kitchen GERD (gastroesophageal reflux disease)   . High cholesterol 12/2011  . History of blood transfusion 07/2011; 12/2011; 01/2012 X 2; 04/2012  . History of gout   . History of lower GI bleeding   . Hypertension   . Iron deficiency anemia   . Mitral regurgitation    a. Moderate by echo, 02/2012  . Myocardial infarction   . Ovarian cancer ([place]) 1992  . Pneumonia ~ 2009  . PUD (peptic ulcer disease)   . TIA (transient ischemic attack)     Patient Active Problem List   Diagnosis Date Noted  . NSTEMI (non-ST elevated myocardial infarction) ([place])   . Encounter for fitting and adjustment of vascular catheter   . End  stage renal disease ([place])   . ESRD (end stage renal disease) ([place])   . Heme positive stool   . Demand ischemia ([place]) 07/27/2016  . Hypertensive emergency 07/08/2016  . Respiratory failure ([place])   . Cardiac arrest ([place])   . Palliative care encounter   . Goals of care, counseling/discussion   . Hypertensive crisis without congestive heart failure 05/09/2016  . Acute pulmonary edema ([place]) 04/06/2016  . Acute respiratory failure ([place]) 04/06/2016  . Hypertensive crisis 01/27/2016  . History of colon cancer 01/27/2016  . History of ovarian cancer 01/27/2016  . Hypertensive urgency 01/27/2016  . Narrow complex tachycardia ([place]) 09/08/2015  . SVT  (supraventricular tachycardia) ([place]) 09/08/2015  . Influenza A 08/30/2015  . Acute on chronic diastolic CHF (congestive heart failure) ([place]) 05/04/2015  . Unstable angina ([place]) 05/03/2015  . Essential hypertension   . Pain in joint, lower leg 08/14/2014  . Chest pain 11/26/2013  . Small bowel obstruction, partial 05/29/2013  . Chronic diastolic CHF (congestive heart failure) ([place]) 03/22/2013  . GI bleeding 03/21/2013  . Acute blood loss anemia 03/21/2013  . Vaginal odor 03/12/2013  . Vaginal discharge 03/12/2013  . Occlusion and stenosis of carotid artery without mention of cerebral infarction 01/24/2013  . Hx of CABG 07/05/2012  . Carotid artery disease ([place]) 07/05/2012  . Anemia of chronic kidney failure 07/03/2012  . Secondary hyperparathyroidism ([place]) 07/03/2012  . Mitral regurgitation 06/12/2012  . Pneumonia 06/09/2012  . Non-STEMI (non-ST elevated myocardial infarction) ([place]) 06/08/2012  . AVM (arteriovenous malformation) of small bowel, acquired ([place]) 01/20/2012  . GERD (gastroesophageal reflux disease) 01/09/2012  . HLD (hyperlipidemia) 01/05/2012  . Atherosclerosis of native coronary artery of native heart without angina pectoris 12/16/2011  . ESRD on hemodialysis ([place]) 12/16/2011    Past Surgical History:  Procedure Laterality Date  . ABDOMINAL HYSTERECTOMY  1992  . APPENDECTOMY  06/1990  . AV FISTULA PLACEMENT  07/2009   left upper arm  . AV FISTULA PLACEMENT Right 09/06/2016   Procedure: RIGHT FOREARM ARTERIOVENOUS (AV) GRAFT;  Surgeon: Elam Dutch, MD;  Location: [facility];  Service: Vascular;  Laterality: Right;  . [place] REMOVAL Right 09/06/2016   Procedure: REMOVAL OF Right Arm ARTERIOVENOUS GORETEX GRAFT and Vein Patch angioplasty of brachial artery;  Surgeon: Angelia Mould, MD;  Location: [facility];  Service: Vascular;  Laterality: Right;  . COLON RESECTION  1992  . CORONARY ANGIOPLASTY WITH STENT PLACEMENT  12/15/11   "2"  . CORONARY ANGIOPLASTY WITH STENT  PLACEMENT  y/2013   "1; makes total of 3" (05/02/2012)  . CORONARY ARTERY BYPASS GRAFT  06/13/2012   Procedure: CORONARY ARTERY BYPASS GRAFTING (CABG);  Surgeon: Grace Isaac, MD;  Location: [facility];  Service: Open Heart Surgery;  Laterality: N/A;  cabg x four;  using left internal mammary artery, and left leg greater saphenous vein harvested endoscopically  . DILATION AND CURETTAGE OF UTERUS    . ESOPHAGOGASTRODUODENOSCOPY  01/20/2012   Procedure: ESOPHAGOGASTRODUODENOSCOPY (EGD);  Surgeon: Ladene Artist, MD,FACG;  Location: [facility] ENDOSCOPY;  Service: Endoscopy;  Laterality: N/A;  . ESOPHAGOGASTRODUODENOSCOPY N/A 03/26/2013   Procedure: ESOPHAGOGASTRODUODENOSCOPY (EGD);  Surgeon: Irene Shipper, MD;  Location: [facility] ENDOSCOPY;  Service: Endoscopy;  Laterality: N/A;  . ESOPHAGOGASTRODUODENOSCOPY N/A 04/30/2015   Procedure: ESOPHAGOGASTRODUODENOSCOPY (EGD);  Surgeon: Rogene Houston, MD;  [facility];  Service: Endoscopy;  Laterality: N/A;  1pm - moved to 10/20 @ 1:10  . ESOPHAGOGASTRODUODENOSCOPY N/A 07/29/2016   Procedure: ESOPHAGOGASTRODUODENOSCOPY (EGD);  Surgeon:  Manus Gunning, MD;  Location: [facility];  Service: Gastroenterology;  Laterality: N/A;  enteroscopy  . INTRAOPERATIVE TRANSESOPHAGEAL ECHOCARDIOGRAM  06/13/2012   Procedure: INTRAOPERATIVE TRANSESOPHAGEAL ECHOCARDIOGRAM;  Surgeon: Grace Isaac, MD;  Location: [facility];  Service: Open Heart Surgery;  Laterality: N/A;  . IR GENERIC HISTORICAL  07/26/2016   IR FLUORO GUIDE CV LINE RIGHT 07/26/2016 Greggory Keen, MD [facility]INTER[facility]  . IR GENERIC HISTORICAL  07/26/2016   IR US GUIDE VASC ACCESS RIGHT 07/26/2016 Greggory Keen, MD [facility]  . IR GENERIC HISTORICAL  08/02/2016   IR US GUIDE VASC ACCESS RIGHT 08/02/2016 Greggory Keen, MD [facility]  . IR GENERIC HISTORICAL  08/02/2016   IR FLUORO GUIDE CV LINE RIGHT 08/02/2016 Greggory Keen, MD [facility]  . LEFT HEART CATH AND CORONARY ANGIOGRAPHY N/A 09/20/2016    Procedure: Left Heart Cath and Coronary Angiography;  Surgeon: [name], MD;  Location: [facility] CV LAB;  Service: Cardiovascular;  Laterality: N/A;  . LEFT HEART CATHETERIZATION WITH CORONARY ANGIOGRAM N/A 12/15/2011   Procedure: LEFT HEART CATHETERIZATION WITH CORONARY ANGIOGRAM;  Surgeon: Burnell Blanks, MD;  Location: [facility] CATH LAB;  Service: Cardiovascular;  Laterality: N/A;  . LEFT HEART CATHETERIZATION WITH CORONARY ANGIOGRAM N/A 01/10/2012   Procedure: LEFT HEART CATHETERIZATION WITH CORONARY ANGIOGRAM;  Surgeon: Peter M Martinique, MD;  Location: [facility] CATH LAB;  Service: Cardiovascular;  Laterality: N/A;  . LEFT HEART CATHETERIZATION WITH CORONARY ANGIOGRAM N/A 06/08/2012   Procedure: LEFT HEART CATHETERIZATION WITH CORONARY ANGIOGRAM;  Surgeon: Burnell Blanks, MD;  Location: [facility] CATH LAB;  Service: Cardiovascular;  Laterality: N/A;  . LEFT HEART CATHETERIZATION WITH CORONARY/GRAFT ANGIOGRAM N/A 12/10/2013   Procedure: LEFT HEART CATHETERIZATION WITH Beatrix Fetters;  Surgeon: [name], MD;  Location: [facility] CATH LAB;  Service: Cardiovascular;  Laterality: N/A;  . OVARY SURGERY     ovarian cancer  . SHUNTOGRAM N/A 10/15/2013   Procedure: Fistulogram;  Surgeon: Serafina Mitchell, MD;  Location: [facility] CATH LAB;  Service: Cardiovascular;  Laterality: N/A;  . THROMBECTOMY / ARTERIOVENOUS GRAFT REVISION  2011   left upper arm  . TUBAL LIGATION  1980's    OB History    Gravida Para Term Preterm AB Living   2 2   2   2    SAB TAB Ectopic Multiple Live Births                   Home Medications    Prior to Admission medications   Medication Sig Start Date End Date Taking? Authorizing Provider  ALPRAZolam (XANAX) 0.25 MG tablet Take 0.25 mg by mouth 2 (two) times daily as needed for anxiety.  11/19/15  Yes Historical Provider, MD  amiodarone (PACERONE) 200 MG tablet Take 1 tablet (200 mg total) by mouth daily. 08/19/16  Yes Herminio Commons, MD  amLODipine (NORVASC)  2.5 MG tablet Take 2.5 mg by mouth daily.   Yes Historical Provider, MD  aspirin EC 81 MG tablet Take 81 mg by mouth daily.    Yes Historical Provider, MD  atorvastatin (LIPITOR) 40 MG tablet Take 1 tablet (40 mg total) by mouth daily at 6 PM. Patient taking differently: Take 40 mg by mouth daily.  09/22/16  Yes Tiffany Carlota Raspberry, PA-C  carvedilol (COREG) 3.125 MG tablet Take 1 tablet (3.125 mg total) by mouth 2 (two) times daily with a meal. 09/22/16  Yes Delos Haring, PA-C  cloNIDine (CATAPRES) 0.2 MG tablet Take 1 tablet (0.2 mg total) by mouth  2 (two) times daily. 09/22/16  Yes Tiffany Carlota Raspberry, PA-C  epoetin alfa (EPOGEN,PROCRIT) 36629 UNIT/ML injection Inject 1 mL (10,000 Units total) into the vein every Monday, Wednesday, and Friday with hemodialysis. 04/11/16  Yes Orvan Falconer, MD  fluticasone (FLONASE) 50 MCG/ACT nasal spray Place 2 sprays into the nose daily as needed for allergies.    Yes Historical Provider, MD  hydrALAZINE (APRESOLINE) 50 MG tablet Take 1.5 tablets (75 mg total) by mouth 3 (three) times daily. 09/28/16  Yes Herminio Commons, MD  isosorbide mononitrate (IMDUR) 120 MG 24 hr tablet Take 1 tablet (120 mg total) by mouth daily. 09/22/16  Yes Tiffany Carlota Raspberry, PA-C  lanthanum (FOSRENOL) 1000 MG chewable tablet Chew 1 tablet (1,000 mg total) by mouth 3 (three) times daily with meals. Patient taking differently: Chew 1,000 mg by mouth 3 (three) times daily after meals.  04/08/16  Yes Orvan Falconer, MD  loratadine (CLARITIN) 10 MG tablet Take 10 mg by mouth daily as needed for allergies.   Yes Historical Provider, MD  metoprolol tartrate (LOPRESSOR) 25 MG tablet Take 12.5 mg by mouth 2 (two) times daily. 08/03/16  Yes Historical Provider, MD  multivitamin (RENA-VIT) TABS tablet Take 1 tablet by mouth daily.   Yes Historical Provider, MD  nitroGLYCERIN (NITROSTAT) 0.4 MG SL tablet Place 1 tablet (0.4 mg total) under the tongue every 5 (five) minutes as needed for chest pain. 08/02/16  Yes Kinnie Feil, MD  omeprazole (PRILOSEC) 20 MG capsule Take 20 mg by mouth daily.   Yes Historical Provider, MD  oxyCODONE-acetaminophen (PERCOCET/ROXICET) 5-325 MG tablet Take 1 tablet by mouth every 6 (six) hours as needed for pain. 09/06/16  Yes Historical Provider, MD  SENSIPAR 30 MG tablet Take 30 mg by mouth daily with supper.  12/21/12  Yes Historical Provider, MD  simvastatin (ZOCOR) 20 MG tablet Take 1 tablet (20 mg total) by mouth at bedtime. 04/04/16  Yes Lendon Colonel, NP  pantoprazole (PROTONIX) 40 MG tablet Take 1 tablet (40 mg total) by mouth daily. Patient not taking: Reported on 09/30/2016 08/03/16   Kinnie Feil, MD    Family History Family History  Problem Relation Age of Onset  . Heart disease Mother     Heart Disease before age 46  . Hyperlipidemia Mother   . Hypertension Mother   . Diabetes Mother   . Heart attack Mother   . Heart disease Father     Heart Disease before age 22  . Hyperlipidemia Father   . Hypertension Father   . Diabetes Father   . Diabetes Sister   . Hypertension Sister   . Diabetes Brother   . Hyperlipidemia Brother   . Heart attack Brother   . Hypertension Sister   . Heart attack Brother   . Other      noncontributory for early CAD  . Colon cancer Neg Hx   . Esophageal cancer Neg Hx   . Liver disease Neg Hx   . Kidney disease Neg Hx   . Colon polyps Neg Hx     Social History Social History  Substance Use Topics  . Smoking status: Never Smoker  . Smokeless tobacco: Never Used  . Alcohol use No     Allergies   Aspirin; Penicillins; Amlodipine; Bactrim [sulfamethoxazole-trimethoprim]; Contrast media [iodinated diagnostic agents]; Iron; Nitrofurantoin; Prilosec [omeprazole]; Tylenol [acetaminophen]; Dexilant [dexlansoprazole]; Levaquin [levofloxacin in d5w]; Morphine and related; Plavix [clopidogrel bisulfate]; Protonix [pantoprazole sodium]; and Venofer [ferric oxide]   Review of Systems Review  of Systems  Constitutional:  Negative for fever.  Respiratory: Negative for shortness of breath.   Cardiovascular: Positive for chest pain.  Gastrointestinal: Positive for abdominal pain and nausea. Negative for blood in stool and vomiting.  Neurological: Negative for weakness and numbness.  All other systems reviewed and are negative.    Physical Exam Updated Vital Signs BP (!) 176/103   Pulse (!) 52   Temp 97.7 F (36.5 C) (Oral)   Resp 19   SpO2 97%   Physical Exam CONSTITUTIONAL: Elderly, no distress HEAD: Normocephalic/atraumatic EYES: EOMI/PERRL ENMT: Mucous membranes moist NECK: supple no meningeal signs SPINE/BACK:entire spine nontender CV: S1/S2 noted, no murmurs/rubs/gallops noted LUNGS: Lungs are clear to auscultation bilaterally, no apparent distress ABDOMEN: soft, minimal RUQ tenderness, no rebound or guarding, bowel sounds noted throughout abdomen GU:no cva tenderness NEURO: Pt is awake/alert/appropriate, moves all extremitiesx4.  No facial droop.   EXTREMITIES: pulses normal/equal, full ROM, dialysis access to L arm with thrill noted.  SKIN: warm, color normal, dialysis catheter R chest. PSYCH: no abnormalities of mood noted, alert and oriented to situation  ED Treatments / Results  Labs (all labs ordered are listed, but only abnormal results are displayed) Labs Reviewed  BASIC METABOLIC PANEL - Abnormal; Notable for the following:       Result Value   Sodium 133 (*)    Potassium 5.3 (*)    Chloride 92 (*)    CO2 21 (*)    Glucose, Bld 115 (*)    BUN 78 (*)    Creatinine, Ser 10.00 (*)    GFR calc non Af Amer 3 (*)    GFR calc Af Amer 4 (*)    Anion gap 20 (*)    All other components within normal limits  CBC - Abnormal; Notable for the following:    RBC 3.19 (*)    Hemoglobin 10.0 (*)    HCT 30.3 (*)    RDW 18.1 (*)    All other components within normal limits  HEPATIC FUNCTION PANEL - Abnormal; Notable for the following:    Total Protein 6.2 (*)    Albumin 3.4 (*)     Indirect Bilirubin 0.2 (*)    All other components within normal limits  LIPASE, BLOOD  I-STAT TROPOININ, ED    EKG  EKG Interpretation  Date/Time:  Monday October 03 2016 00:00:33 EDT Ventricular Rate:  54 PR Interval:  180 QRS Duration: 108 QT Interval:  492 QTC Calculation: 466 R Axis:   11 Text Interpretation:  Sinus bradycardia Left ventricular hypertrophy with repolarization abnormality Anterior infarct , age undetermined Abnormal ECG No significant change since last tracing Confirmed by [name]  MD, Willma Obando ([id number]) on 10/03/2016 12:12:33 AM       Radiology Dg Chest 2 View  Result Date: 10/03/2016 CLINICAL DATA:  Acute onset of high blood pressure and headache. Initial encounter. EXAM: CHEST  2 VIEW COMPARISON:  Chest radiograph performed 09/19/2016 FINDINGS: The lungs are well-aerated. Mild left basilar atelectasis is again noted. There is no evidence of focal opacification, pleural effusion or pneumothorax. The heart is borderline enlarged. The patient is status post median sternotomy, with evidence of prior CABG. No acute osseous abnormalities are seen. A right-sided dual-lumen catheter is noted ending about the cavoatrial junction. Scattered calcification is seen along the proximal abdominal aorta. IMPRESSION: 1. Mild left basilar atelectasis again noted. Lungs otherwise clear. 2. Borderline cardiomegaly. 3. Scattered aortic atherosclerosis. Electronically Signed   By: Garald Balding M.D.   On:  10/03/2016 00:30   US Abdomen Limited Ruq  Result Date: 10/03/2016 CLINICAL DATA:  Right upper quadrant pain, onset yesterday. EXAM: US ABDOMEN LIMITED - RIGHT UPPER QUADRANT COMPARISON:  Ultrasound 03/22/2016, CT 03/03/2016 FINDINGS: Gallbladder: Physiologically distended. No gallstones. Mild gallbladder wall thickening measuring 3-4 mm. Trace pericholecystic fluid. No sonographic Murphy sign noted by sonographer. Common bile duct: Diameter: 2 mm. Liver: No focal lesion identified. Within  normal limits in parenchymal echogenicity. Normal directional flow in the main portal vein. Incidental note of increased right renal echogenicity and right renal cyst. IMPRESSION: 1. Borderline gallbladder wall thickening with trace pericholecystic fluid, nonspecific. This can be seen in the setting of systemic causes such as liver disease or cardiac failure, or primary gallbladder inflammation. No gallstones or sonographic Murphy sign. 2. No biliary dilatation. Unremarkable sonographic appearance of the liver. Electronically Signed   By: Jeb Levering M.D.   On: 10/03/2016 04:13    Procedures Procedures (including critical care time)  Medications Ordered in ED Medications - No data to display   Initial Impression / Assessment and Plan / ED Course  I have reviewed the triage vital signs and the nursing notes.  Pertinent labs & imaging results that were available during my care of the patient were reviewed by me and considered in my medical decision making (see chart for details).     Pt stable in ED Due to location of pain, I elected to obtain US imaging No acute signs of cholecystitis Pt is improved She was sleeping on reassessment BP is improved BP (!) 158/67   Pulse (!) 48   Temp 97.7 F (36.5 C) (Oral)   Resp 18   SpO2 97%  She just had cardiac cath this month, and she has CAD but advised to perform medical management Since pain resolved and troponin negative, I don't feel further cardiac workup/admission warranted Abdominal exam otherwise unremarkable She plans to go to dialysis today (mild hyperK and mild metabolic acidosis noted) but she is well appearing/nontoxic, I feel she is safe for d/c  She wants to cancel her outpatient vascular procedure today I advised to call her cardiologist this week to review BP med as she may need adjustments to her BP meds  Final Clinical Impressions(s) / ED Diagnoses   Final diagnoses:  RUQ abdominal pain  Chronic renal failure, stage  5 (HCC)  Essential hypertension    New Prescriptions New Prescriptions   No medications on file  I personally performed the services described in this documentation, which was scribed in my presence. The recorded information has been reviewed and is accurate.       Ripley Fraise, MD 10/03/16 [phone]

## 2016-10-05 DIAGNOSIS — N186 End stage renal disease: Secondary | ICD-10-CM | POA: Diagnosis not present

## 2016-10-05 DIAGNOSIS — Z992 Dependence on renal dialysis: Secondary | ICD-10-CM | POA: Diagnosis not present

## 2016-10-06 ENCOUNTER — Ambulatory Visit: Payer: Medicare HMO | Admitting: Cardiology

## 2016-10-06 ENCOUNTER — Encounter (INDEPENDENT_AMBULATORY_CARE_PROVIDER_SITE_OTHER): Payer: Medicare HMO

## 2016-10-06 ENCOUNTER — Encounter (INDEPENDENT_AMBULATORY_CARE_PROVIDER_SITE_OTHER): Payer: Self-pay | Admitting: Vascular Surgery

## 2016-10-06 ENCOUNTER — Ambulatory Visit (INDEPENDENT_AMBULATORY_CARE_PROVIDER_SITE_OTHER): Payer: Medicare HMO | Admitting: Vascular Surgery

## 2016-10-06 ENCOUNTER — Encounter (INDEPENDENT_AMBULATORY_CARE_PROVIDER_SITE_OTHER): Payer: Self-pay

## 2016-10-06 VITALS — BP 147/62 | HR 55 | Resp 16 | Ht 62.0 in | Wt 129.0 lb

## 2016-10-06 DIAGNOSIS — T829XXA Unspecified complication of cardiac and vascular prosthetic device, implant and graft, initial encounter: Secondary | ICD-10-CM

## 2016-10-06 DIAGNOSIS — N186 End stage renal disease: Secondary | ICD-10-CM | POA: Diagnosis not present

## 2016-10-06 DIAGNOSIS — I469 Cardiac arrest, cause unspecified: Secondary | ICD-10-CM | POA: Diagnosis not present

## 2016-10-07 DIAGNOSIS — N186 End stage renal disease: Secondary | ICD-10-CM | POA: Diagnosis not present

## 2016-10-07 DIAGNOSIS — Z992 Dependence on renal dialysis: Secondary | ICD-10-CM | POA: Diagnosis not present

## 2016-10-08 DIAGNOSIS — N186 End stage renal disease: Secondary | ICD-10-CM | POA: Diagnosis not present

## 2016-10-08 DIAGNOSIS — Z992 Dependence on renal dialysis: Secondary | ICD-10-CM | POA: Diagnosis not present

## 2016-10-09 ENCOUNTER — Other Ambulatory Visit: Payer: Self-pay

## 2016-10-09 ENCOUNTER — Inpatient Hospital Stay (HOSPITAL_COMMUNITY)
Admission: EM | Admit: 2016-10-09 | Discharge: 2016-10-14 | DRG: 853 | Disposition: A | Discharge status: Home / Self Care | Payer: Medicare HMO | Attending: Family Medicine | Admitting: Family Medicine

## 2016-10-09 ENCOUNTER — Emergency Department (HOSPITAL_COMMUNITY): Payer: Medicare HMO

## 2016-10-09 ENCOUNTER — Encounter (HOSPITAL_COMMUNITY): Payer: Self-pay | Admitting: Emergency Medicine

## 2016-10-09 DIAGNOSIS — I5022 Chronic systolic (congestive) heart failure: Secondary | ICD-10-CM | POA: Diagnosis present

## 2016-10-09 DIAGNOSIS — E8770 Fluid overload, unspecified: Secondary | ICD-10-CM | POA: Diagnosis present

## 2016-10-09 DIAGNOSIS — I160 Hypertensive urgency: Secondary | ICD-10-CM | POA: Diagnosis not present

## 2016-10-09 DIAGNOSIS — E8889 Other specified metabolic disorders: Secondary | ICD-10-CM | POA: Diagnosis present

## 2016-10-09 DIAGNOSIS — K219 Gastro-esophageal reflux disease without esophagitis: Secondary | ICD-10-CM | POA: Diagnosis present

## 2016-10-09 DIAGNOSIS — I480 Paroxysmal atrial fibrillation: Secondary | ICD-10-CM | POA: Diagnosis present

## 2016-10-09 DIAGNOSIS — I5032 Chronic diastolic (congestive) heart failure: Secondary | ICD-10-CM | POA: Diagnosis present

## 2016-10-09 DIAGNOSIS — R0902 Hypoxemia: Secondary | ICD-10-CM

## 2016-10-09 DIAGNOSIS — I5033 Acute on chronic diastolic (congestive) heart failure: Secondary | ICD-10-CM | POA: Diagnosis present

## 2016-10-09 DIAGNOSIS — N2581 Secondary hyperparathyroidism of renal origin: Secondary | ICD-10-CM | POA: Diagnosis present

## 2016-10-09 DIAGNOSIS — I2510 Atherosclerotic heart disease of native coronary artery without angina pectoris: Secondary | ICD-10-CM | POA: Diagnosis present

## 2016-10-09 DIAGNOSIS — Z09 Encounter for follow-up examination after completed treatment for conditions other than malignant neoplasm: Secondary | ICD-10-CM

## 2016-10-09 DIAGNOSIS — J810 Acute pulmonary edema: Secondary | ICD-10-CM | POA: Diagnosis not present

## 2016-10-09 DIAGNOSIS — I214 Non-ST elevation (NSTEMI) myocardial infarction: Secondary | ICD-10-CM | POA: Diagnosis present

## 2016-10-09 DIAGNOSIS — Z8711 Personal history of peptic ulcer disease: Secondary | ICD-10-CM

## 2016-10-09 DIAGNOSIS — Z992 Dependence on renal dialysis: Secondary | ICD-10-CM | POA: Diagnosis not present

## 2016-10-09 DIAGNOSIS — Z8673 Personal history of transient ischemic attack (TIA), and cerebral infarction without residual deficits: Secondary | ICD-10-CM

## 2016-10-09 DIAGNOSIS — Z955 Presence of coronary angioplasty implant and graft: Secondary | ICD-10-CM

## 2016-10-09 DIAGNOSIS — R0603 Acute respiratory distress: Secondary | ICD-10-CM

## 2016-10-09 DIAGNOSIS — A419 Sepsis, unspecified organism: Secondary | ICD-10-CM | POA: Diagnosis not present

## 2016-10-09 DIAGNOSIS — Z951 Presence of aortocoronary bypass graft: Secondary | ICD-10-CM

## 2016-10-09 DIAGNOSIS — I447 Left bundle-branch block, unspecified: Secondary | ICD-10-CM | POA: Diagnosis present

## 2016-10-09 DIAGNOSIS — E7800 Pure hypercholesterolemia, unspecified: Secondary | ICD-10-CM | POA: Diagnosis present

## 2016-10-09 DIAGNOSIS — R079 Chest pain, unspecified: Secondary | ICD-10-CM | POA: Diagnosis not present

## 2016-10-09 DIAGNOSIS — R05 Cough: Secondary | ICD-10-CM | POA: Diagnosis not present

## 2016-10-09 DIAGNOSIS — R000 Tachycardia, unspecified: Secondary | ICD-10-CM | POA: Diagnosis present

## 2016-10-09 DIAGNOSIS — Z8543 Personal history of malignant neoplasm of ovary: Secondary | ICD-10-CM

## 2016-10-09 DIAGNOSIS — I132 Hypertensive heart and chronic kidney disease with heart failure and with stage 5 chronic kidney disease, or end stage renal disease: Secondary | ICD-10-CM | POA: Diagnosis present

## 2016-10-09 DIAGNOSIS — Z886 Allergy status to analgesic agent status: Secondary | ICD-10-CM

## 2016-10-09 DIAGNOSIS — Y95 Nosocomial condition: Secondary | ICD-10-CM | POA: Diagnosis present

## 2016-10-09 DIAGNOSIS — I252 Old myocardial infarction: Secondary | ICD-10-CM

## 2016-10-09 DIAGNOSIS — I120 Hypertensive chronic kidney disease with stage 5 chronic kidney disease or end stage renal disease: Secondary | ICD-10-CM | POA: Diagnosis not present

## 2016-10-09 DIAGNOSIS — Z885 Allergy status to narcotic agent status: Secondary | ICD-10-CM

## 2016-10-09 DIAGNOSIS — N186 End stage renal disease: Secondary | ICD-10-CM

## 2016-10-09 DIAGNOSIS — I161 Hypertensive emergency: Secondary | ICD-10-CM | POA: Diagnosis present

## 2016-10-09 DIAGNOSIS — I5042 Chronic combined systolic (congestive) and diastolic (congestive) heart failure: Secondary | ICD-10-CM | POA: Diagnosis present

## 2016-10-09 DIAGNOSIS — J9621 Acute and chronic respiratory failure with hypoxia: Secondary | ICD-10-CM | POA: Diagnosis not present

## 2016-10-09 DIAGNOSIS — Z880 Allergy status to penicillin: Secondary | ICD-10-CM

## 2016-10-09 DIAGNOSIS — J189 Pneumonia, unspecified organism: Secondary | ICD-10-CM | POA: Diagnosis present

## 2016-10-09 DIAGNOSIS — J811 Chronic pulmonary edema: Secondary | ICD-10-CM

## 2016-10-09 DIAGNOSIS — Z9109 Other allergy status, other than to drugs and biological substances: Secondary | ICD-10-CM

## 2016-10-09 DIAGNOSIS — Z91041 Radiographic dye allergy status: Secondary | ICD-10-CM

## 2016-10-09 DIAGNOSIS — E785 Hyperlipidemia, unspecified: Secondary | ICD-10-CM | POA: Diagnosis present

## 2016-10-09 HISTORY — DX: Acute and chronic respiratory failure with hypoxia: J96.21

## 2016-10-09 LAB — CBC WITH DIFFERENTIAL/PLATELET
Basophils Absolute: 0.1 10*3/uL (ref 0.0–0.1)
Basophils Relative: 1 %
Eosinophils Absolute: 0.6 10*3/uL (ref 0.0–0.7)
Eosinophils Relative: 5 %
HEMATOCRIT: 37 % (ref 36.0–46.0)
HEMOGLOBIN: 11.7 g/dL — AB (ref 12.0–15.0)
LYMPHS ABS: 1.8 10*3/uL (ref 0.7–4.0)
LYMPHS PCT: 15 %
MCH: 30.6 pg (ref 26.0–34.0)
MCHC: 31.6 g/dL (ref 30.0–36.0)
MCV: 96.9 fL (ref 78.0–100.0)
MONO ABS: 0.6 10*3/uL (ref 0.1–1.0)
MONOS PCT: 5 %
NEUTROS ABS: 9 10*3/uL — AB (ref 1.7–7.7)
Neutrophils Relative %: 74 %
Platelets: 212 10*3/uL (ref 150–400)
RBC: 3.82 MIL/uL — ABNORMAL LOW (ref 3.87–5.11)
RDW: 17.6 % — AB (ref 11.5–15.5)
WBC: 12 10*3/uL — ABNORMAL HIGH (ref 4.0–10.5)

## 2016-10-09 LAB — I-STAT CG4 LACTIC ACID, ED: LACTIC ACID, VENOUS: 2 mmol/L — AB (ref 0.5–1.9)

## 2016-10-09 LAB — PROTIME-INR
INR: 0.91
Prothrombin Time: 12.2 seconds (ref 11.4–15.2)

## 2016-10-09 LAB — I-STAT TROPONIN, ED: Troponin i, poc: 0.06 ng/mL (ref 0.00–0.08)

## 2016-10-09 IMAGING — DX DG CHEST 1V PORT
1 series · 1 of 1 positions shown · non-contrast
Comparison: [DATE]

CLINICAL DATA: Severe dyspnea, cough and congestion

EXAM:
PORTABLE CHEST 1 VIEW

[chest ap]
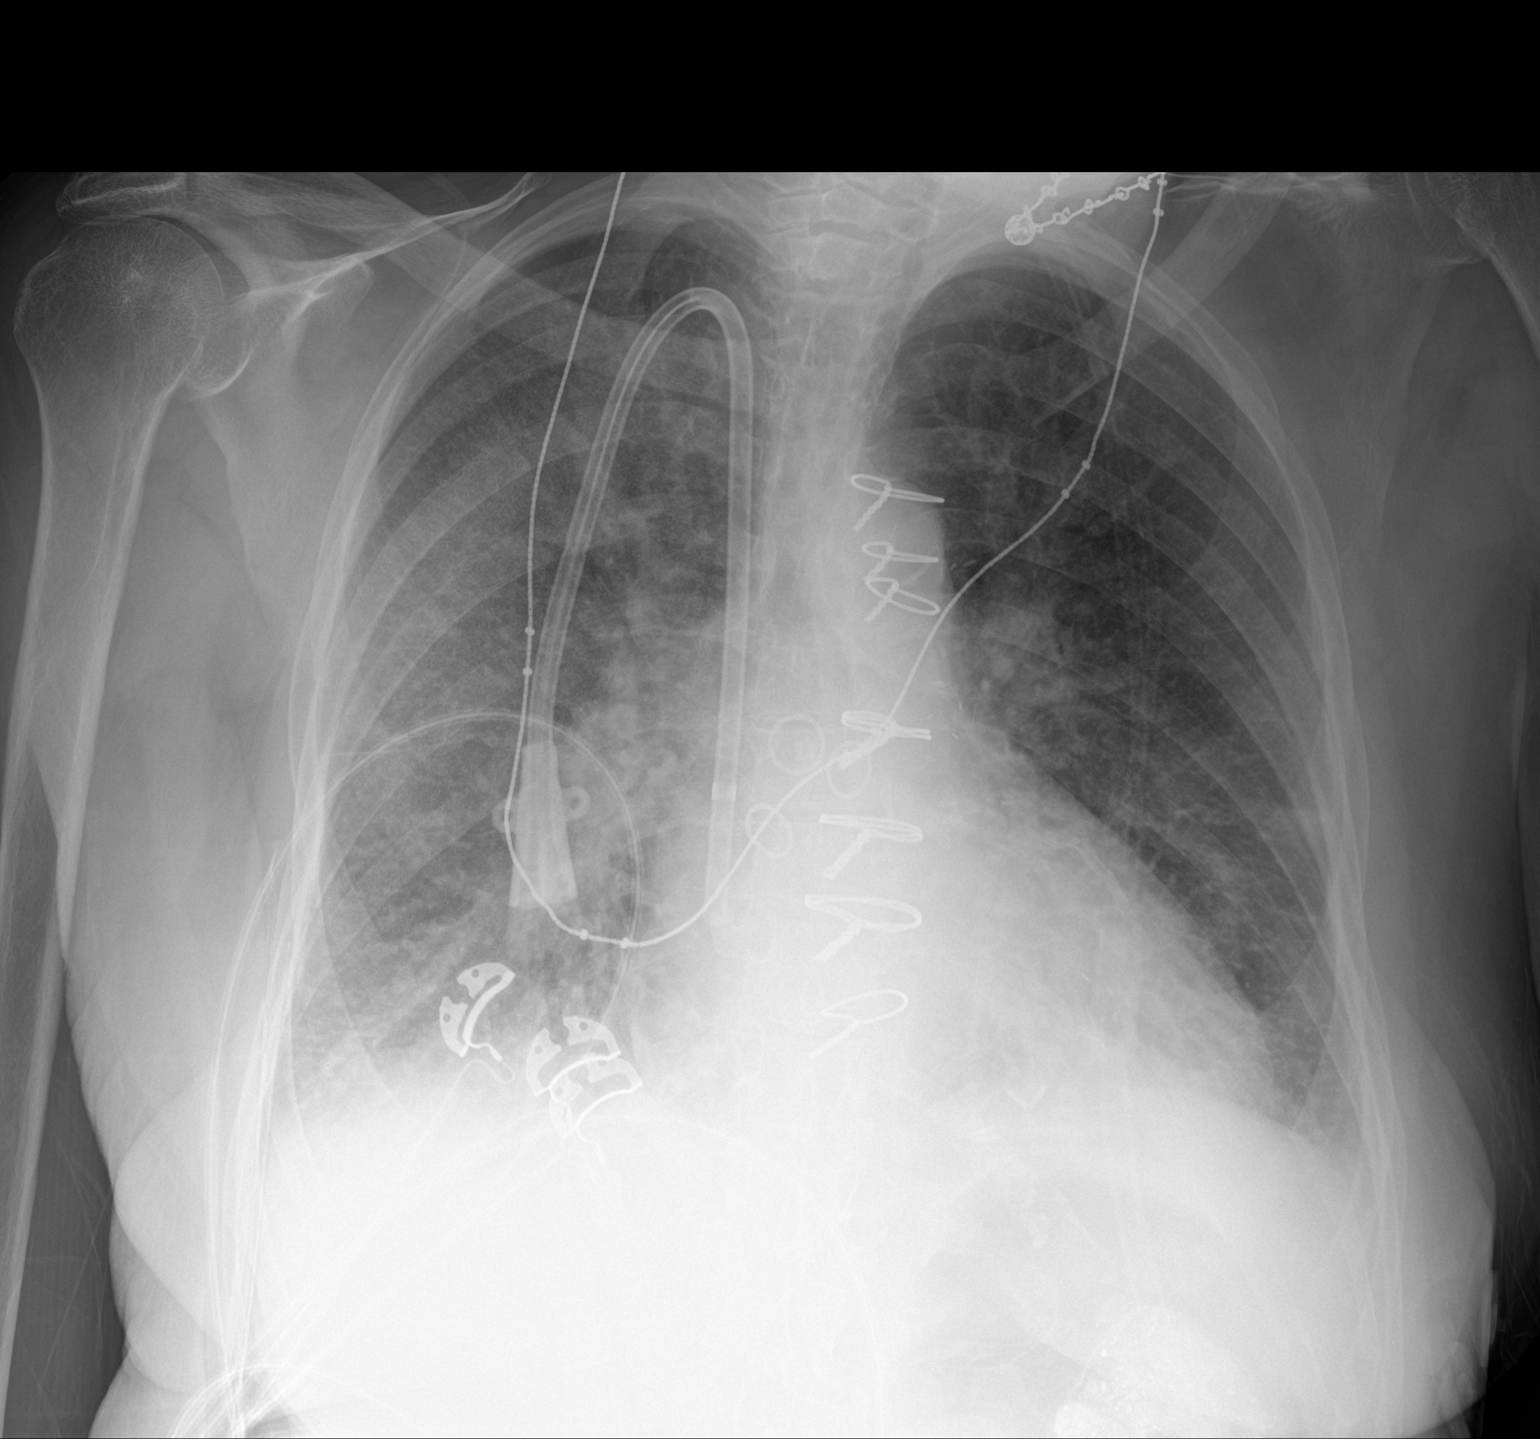

[1 of 1 positions shown; findings below may reference images not displayed]

FINDINGS: Cardiomegaly with aortic atherosclerosis. Kerley B-lines consistent
with CHF are identified bilaterally. Median sternotomy sutures and
right-sided dialysis catheter tips are again visualized without
significant change. Superimposed airspace disease about the right
hilum and right lung base cannot be entirely excluded. No acute
osseous abnormality.
IMPRESSION: Cardiomegaly with aortic atherosclerosis. Interval development of
CHF. Superimposed airspace disease/pneumonia at the right lung base
and about the right hilum is not entirely excluded.

## 2016-10-09 MED ORDER — ALBUTEROL SULFATE (2.5 MG/3ML) 0.083% IN NEBU
5.0000 mg | INHALATION_SOLUTION | Freq: Once | RESPIRATORY_TRACT | Status: AC
  Administered 2016-10-09: 5 mg via RESPIRATORY_TRACT
  Filled 2016-10-09: qty 6

## 2016-10-09 MED ORDER — DEXTROSE 5 % IV SOLN
2.0000 g | Freq: Once | INTRAVENOUS | Status: AC
  Administered 2016-10-10: 2 g via INTRAVENOUS
  Filled 2016-10-09: qty 2

## 2016-10-09 MED ORDER — VANCOMYCIN HCL IN DEXTROSE 1-5 GM/200ML-% IV SOLN
1000.0000 mg | Freq: Once | INTRAVENOUS | Status: AC
  Administered 2016-10-10: 1000 mg via INTRAVENOUS
  Filled 2016-10-09: qty 200

## 2016-10-09 MED ORDER — NITROGLYCERIN 0.4 MG SL SUBL
0.4000 mg | SUBLINGUAL_TABLET | SUBLINGUAL | Status: DC | PRN
  Administered 2016-10-09 – 2016-10-12 (×7): 0.4 mg via SUBLINGUAL
  Filled 2016-10-09 (×4): qty 1

## 2016-10-09 MED ORDER — IPRATROPIUM-ALBUTEROL 0.5-2.5 (3) MG/3ML IN SOLN
3.0000 mL | Freq: Once | RESPIRATORY_TRACT | Status: AC
  Administered 2016-10-09: 3 mL via RESPIRATORY_TRACT
  Filled 2016-10-09: qty 3

## 2016-10-09 NOTE — ED Triage Notes (Signed)
Brought by spouse for c/o SOB.  Dialysis patient went Friday.  Due again on Monday.  Dialysis access to right chest.  Coughing up phlegm.  Reports sudden onset.

## 2016-10-09 NOTE — ED Provider Notes (Signed)
Emergency Department Provider Note   I have reviewed the triage vital signs and the nursing notes.   HISTORY  Chief Complaint Shortness of Breath   HPI Shawna [name] is a 76 y.o. female Shawna [name] is a 76 y.o. female with history of NSTEMI, ESRD on HD MWF, HTN, diastolic HF, and CAD with CABG who presents to the ED for evaluation of dyspnea. This patient states that at 2130 she developed central chest pain; 30 minutes later she also developed dyspnea and took 3x sublingual nitro. At interview currently, she states that her chest pain has resolved. She is only reporting dyspnea. Her last dialysis was 2 days ago. She believes that she is accumulating fluid. Denies fever, chills, nausea, vomiting, or diarrhea. No radiating symptoms. Symptoms are severe.   Level 5 caveat: Significant respiratory distress.    Past Medical History:  Diagnosis Date  . Anxiety   . Arthritis   . AVM (arteriovenous malformation) of colon   . CAD (coronary artery disease)    a. 12/2011 NSTEMI/Cath/PCI LCX (2.25x14 Resolute DES) & D1 (2.25x22 Resolute DES);  b. 01/2012 Cath/PCI: LM 30, LAD 30p, 40-26m, D1 stent ok, 99 in sm branch of diag, LCX patent stent, OM1 20, RCA 95 ost (4.0x12 Promus DES), EF 55%;  c. 04/2012 Lexi Cardiolite  EF 48%, small area of scar @ base/mid inflat wall with mild peri-infarct ischemia.; CABG 12/4  . Carotid artery disease ([place])    a. 34-19% LICA, 09/7900   . Chronic bronchitis ([place])   . Chronic diastolic CHF (congestive heart failure) ([place])    a. 02/2012 Echo EF 60-65%, nl wall motion, Gr 1 DD, mod MR  . Colon cancer ([place]) 1992  . Esophageal stricture   . ESRD on hemodialysis ([place])    ESRD due to HTN, started dialysis 2011 and gets HD at [facility] with Dr Hinda Lenis on MWF schedule.  Access is LUA AVF as of Sept 2014.   Marland Kitchen GERD (gastroesophageal reflux disease)   . High cholesterol 12/2011  . History of blood transfusion 07/2011; 12/2011; 01/2012 X 2; 04/2012  . History of gout     . History of lower GI bleeding   . Hypertension   . Iron deficiency anemia   . Mitral regurgitation    a. Moderate by echo, 02/2012  . Myocardial infarction   . Ovarian cancer ([place]) 1992  . Pneumonia ~ 2009  . PUD (peptic ulcer disease)   . TIA (transient ischemic attack)     Patient Active Problem List   Diagnosis Date Noted  . Fluid overload 10/10/2016  . NSTEMI (non-ST elevated myocardial infarction) ([place])   . Encounter for fitting and adjustment of vascular catheter   . End stage renal disease ([place])   . ESRD (end stage renal disease) ([place])   . Heme positive stool   . Demand ischemia ([place]) 07/27/2016  . Hypertensive emergency 07/08/2016  . Respiratory failure ([place])   . Cardiac arrest ([place])   . Palliative care encounter   . Goals of care, counseling/discussion   . Hypertensive crisis without congestive heart failure 05/09/2016  . Acute pulmonary edema ([place]) 04/06/2016  . Acute respiratory failure ([place]) 04/06/2016  . Hypertensive crisis 01/27/2016  . History of colon cancer 01/27/2016  . History of ovarian cancer 01/27/2016  . Hypertensive urgency 01/27/2016  . Narrow complex tachycardia ([place]) 09/08/2015  . SVT (supraventricular tachycardia) ([place]) 09/08/2015  . Influenza A 08/30/2015  . Acute on chronic diastolic CHF (congestive  heart failure) ([place]) 05/04/2015  . Unstable angina ([place]) 05/03/2015  . Essential hypertension   . Pain in joint, lower leg 08/14/2014  . Chest pain 11/26/2013  . Small bowel obstruction, partial 05/29/2013  . Chronic diastolic CHF (congestive heart failure) ([place]) 03/22/2013  . GI bleeding 03/21/2013  . Acute blood loss anemia 03/21/2013  . Vaginal odor 03/12/2013  . Vaginal discharge 03/12/2013  . Occlusion and stenosis of carotid artery without mention of cerebral infarction 01/24/2013  . Hx of CABG 07/05/2012  . Carotid artery disease ([place]) 07/05/2012  . Anemia of chronic kidney failure 07/03/2012  . Secondary hyperparathyroidism ([place])  07/03/2012  . Mitral regurgitation 06/12/2012  . Pneumonia 06/09/2012  . Non-STEMI (non-ST elevated myocardial infarction) ([place]) 06/08/2012  . AVM (arteriovenous malformation) of small bowel, acquired ([place]) 01/20/2012  . GERD (gastroesophageal reflux disease) 01/09/2012  . HLD (hyperlipidemia) 01/05/2012  . Atherosclerosis of native coronary artery of native heart without angina pectoris 12/16/2011  . ESRD on hemodialysis ([place]) 12/16/2011    Past Surgical History:  Procedure Laterality Date  . ABDOMINAL HYSTERECTOMY  1992  . APPENDECTOMY  06/1990  . AV FISTULA PLACEMENT  07/2009   left upper arm  . AV FISTULA PLACEMENT Right 09/06/2016   Procedure: RIGHT FOREARM ARTERIOVENOUS (AV) GRAFT;  Surgeon: Elam Dutch, MD;  Location: [place];  Service: Vascular;  Laterality: Right;  . [place] REMOVAL Right 09/06/2016   Procedure: REMOVAL OF Right Arm ARTERIOVENOUS GORETEX GRAFT and Vein Patch angioplasty of brachial artery;  Surgeon: Angelia Mould, MD;  Location: [place];  Service: Vascular;  Laterality: Right;  . COLON RESECTION  1992  . CORONARY ANGIOPLASTY WITH STENT PLACEMENT  12/15/11   "2"  . CORONARY ANGIOPLASTY WITH STENT PLACEMENT  y/2013   "1; makes total of 3" (05/02/2012)  . CORONARY ARTERY BYPASS GRAFT  06/13/2012   Procedure: CORONARY ARTERY BYPASS GRAFTING (CABG);  Surgeon: Grace Isaac, MD;  Location: [place];  Service: Open Heart Surgery;  Laterality: N/A;  cabg x four;  using left internal mammary artery, and left leg greater saphenous vein harvested endoscopically  . DILATION AND CURETTAGE OF UTERUS    . ESOPHAGOGASTRODUODENOSCOPY  01/20/2012   Procedure: ESOPHAGOGASTRODUODENOSCOPY (EGD);  Surgeon: Ladene Artist, MD,FACG;  Location: [facility] ENDOSCOPY;  Service: Endoscopy;  Laterality: N/A;  . ESOPHAGOGASTRODUODENOSCOPY N/A 03/26/2013   Procedure: ESOPHAGOGASTRODUODENOSCOPY (EGD);  Surgeon: Irene Shipper, MD;  Location[facility] ENDOSCOPY;  Service: Endoscopy;  Laterality: N/A;  .  ESOPHAGOGASTRODUODENOSCOPY N/A 04/30/2015   Procedure: ESOPHAGOGASTRODUODENOSCOPY (EGD);  Surgeon: Rogene Houston, MD;  [facility];  Service: Endoscopy;  Laterality: N/A;  1pm - moved to 10/20 @ 1:10  . ESOPHAGOGASTRODUODENOSCOPY N/A 07/29/2016   Procedure: ESOPHAGOGASTRODUODENOSCOPY (EGD);  Surgeon: Manus Gunning, MD;  Location: [place];  Service: Gastroenterology;  Laterality: N/A;  enteroscopy  . INTRAOPERATIVE TRANSESOPHAGEAL ECHOCARDIOGRAM  06/13/2012   Procedure: INTRAOPERATIVE TRANSESOPHAGEAL ECHOCARDIOGRAM;  Surgeon: Grace Isaac, MD;  Location: [place];  Service: Open Heart Surgery;  Laterality: N/A;  . IR GENERIC HISTORICAL  07/26/2016   IR FLUORO GUIDE CV LINE RIGHT 07/26/2016 Greggory Keen, MD [facility]INTER[facility]  . IR GENERIC HISTORICAL  07/26/2016   IR US GUIDE VASC ACCESS RIGHT 07/26/2016 Greggory Keen, MD [facility]  . IR GENERIC HISTORICAL  08/02/2016   IR US GUIDE VASC ACCESS RIGHT 08/02/2016 Greggory Keen, MD [facility]  . IR GENERIC HISTORICAL  08/02/2016   IR FLUORO GUIDE CV LINE RIGHT 08/02/2016 Greggory Keen, MD [facility]  . LEFT HEART CATH AND CORONARY ANGIOGRAPHY N/A 09/20/2016   Procedure: Left Heart Cath and Coronary Angiography;  Surgeon: [name], MD;  Location: [place] CV LAB;  Service: Cardiovascular;  Laterality: N/A;  . LEFT HEART CATHETERIZATION WITH CORONARY ANGIOGRAM N/A 12/15/2011   Procedure: LEFT HEART CATHETERIZATION WITH CORONARY ANGIOGRAM;  Surgeon: Burnell Blanks, MD;  Location: [facility] CATH LAB;  Service: Cardiovascular;  Laterality: N/A;  . LEFT HEART CATHETERIZATION WITH CORONARY ANGIOGRAM N/A 01/10/2012   Procedure: LEFT HEART CATHETERIZATION WITH CORONARY ANGIOGRAM;  Surgeon: Peter M Martinique, MD;  Location: [facility] CATH LAB;  Service: Cardiovascular;  Laterality: N/A;  . LEFT HEART CATHETERIZATION WITH CORONARY ANGIOGRAM N/A 06/08/2012   Procedure: LEFT HEART CATHETERIZATION WITH CORONARY ANGIOGRAM;  Surgeon: Burnell Blanks, MD;  Location: [facility] CATH LAB;  Service: Cardiovascular;  Laterality: N/A;  . LEFT HEART CATHETERIZATION WITH CORONARY/GRAFT ANGIOGRAM N/A 12/10/2013   Procedure: LEFT HEART CATHETERIZATION WITH Beatrix Fetters;  Surgeon: [name], MD;  Location: [facility] CATH LAB;  Service: Cardiovascular;  Laterality: N/A;  . OVARY SURGERY     ovarian cancer  . SHUNTOGRAM N/A 10/15/2013   Procedure: Fistulogram;  Surgeon: Serafina Mitchell, MD;  Location: [facility] CATH LAB;  Service: Cardiovascular;  Laterality: N/A;  . THROMBECTOMY / ARTERIOVENOUS GRAFT REVISION  2011   left upper arm  . TUBAL LIGATION  1980's    Current Outpatient Rx  . Order #: [id number] Class: Historical Med  . Order #: [id number] Class: Normal  . Order #: [id number] Class: Historical Med  . Order #: [id number] Class: Historical Med  . Order #: [id number] Class: Normal  . Order #: [id number] Class: Normal  . Order #: [id number] Class: Normal  . Order #: [id number] Class: Normal  . Order #: [id number] Class: Historical Med  . Order #: [id number] Class: Normal  . Order #: [id number] Class: Normal  . Order #: [id number] Class: Normal  . Order #: [id number] Class: Historical Med  . Order #: [id number] Class: Historical Med  . Order #: [id number] Class: Historical Med  . Order #: [id number] Class: Normal  . Order #: [id number] Class: Historical Med  . Order #: [id number] Class: Historical Med  . Order #: [id number] Class: Normal  . Order #: [id number] Class: Historical Med  . Order #: [id number] Class: Normal    Allergies Aspirin; Penicillins; Amlodipine; Bactrim [sulfamethoxazole-trimethoprim]; Contrast media [iodinated diagnostic agents]; Iron; Nitrofurantoin; Prilosec [omeprazole]; Tylenol [acetaminophen]; Dexilant [dexlansoprazole]; Levaquin [levofloxacin in d5w]; Morphine and related; Plavix [clopidogrel bisulfate]; Protonix [pantoprazole sodium]; and Venofer [ferric oxide]  Family History  Problem Relation Age of Onset  . Heart disease Mother      Heart Disease before age 54  . Hyperlipidemia Mother   . Hypertension Mother   . Diabetes Mother   . Heart attack Mother   . Heart disease Father     Heart Disease before age 54  . Hyperlipidemia Father   . Hypertension Father   . Diabetes Father   . Diabetes Sister   . Hypertension Sister   . Diabetes Brother   . Hyperlipidemia Brother   . Heart attack Brother   . Hypertension Sister   . Heart attack Brother   . Other      noncontributory for early CAD  . Colon cancer Neg Hx   . Esophageal cancer Neg Hx   . Liver disease Neg Hx   . Kidney disease Neg Hx   . Colon polyps Neg Hx     Social History Social History  Substance Use Topics  . Smoking status: Never Smoker  . Smokeless tobacco: Never  Used  . Alcohol use No    Review of Systems  Level 5 caveat: Respiratory distress.   ____________________________________________   PHYSICAL EXAM:  VITAL SIGNS: ED Triage Vitals  Enc Vitals Group     BP --      Pulse Rate 10/09/16 2309 (!) 129     Resp 10/09/16 2309 (!) 26     Temp 10/09/16 2309 98.5 F (36.9 C)     Temp Source 10/09/16 2309 Oral     SpO2 10/09/16 2308 (!) 85 %     Weight 10/09/16 2316 129 lb (58.5 kg)     Height 10/09/16 2316 5\' 2"  (1.575 m)   Constitutional: Patient with acute respiratory distress.  Eyes: Conjunctivae are normal.  Head: Atraumatic. Nose: No congestion/rhinnorhea. Mouth/Throat: Mucous membranes are moist.  Neck: No stridor.   Cardiovascular: Sinus tachycardia. Good peripheral circulation. Grossly normal heart sounds.   Respiratory: Significantly increased respiratory effort.  No retractions. Lungs with diffuse crackles and diminished at the bases.  Gastrointestinal: Soft and nontender. No distention.  Musculoskeletal: No lower extremity tenderness nor edema. No gross deformities of extremities. Neurologic:  Normal speech and language. No gross focal neurologic deficits are appreciated.  Skin:  Skin is warm, dry and intact. No  rash noted. ____________________________________________   LABS (all labs ordered are listed, but only abnormal results are displayed)  Labs Reviewed  COMPREHENSIVE METABOLIC PANEL - Abnormal; Notable for the following:       Result Value   Chloride 96 (*)    Glucose, Bld 139 (*)    BUN 54 (*)    Creatinine, Ser 9.96 (*)    GFR calc non Af Amer 3 (*)    GFR calc Af Amer 4 (*)    Anion gap 20 (*)    All other components within normal limits  CBC WITH DIFFERENTIAL/PLATELET - Abnormal; Notable for the following:    WBC 12.0 (*)    RBC 3.82 (*)    Hemoglobin 11.7 (*)    RDW 17.6 (*)    Neutro Abs 9.0 (*)    All other components within normal limits  I-STAT CG4 LACTIC ACID, ED - Abnormal; Notable for the following:    Lactic Acid, Venous 2.00 (*)    All other components within normal limits  CULTURE, BLOOD (ROUTINE X 2)  CULTURE, BLOOD (ROUTINE X 2)  PROTIME-INR  URINALYSIS, ROUTINE W REFLEX MICROSCOPIC  I-STAT TROPOININ, ED   ____________________________________________  EKG   EKG Interpretation  Date/Time:  Sunday October 09 2016 23:40:28 EDT Ventricular Rate:  132 PR Interval:  140 QRS Duration: 133 QT Interval:  389 QTC Calculation: 577 R Axis:   67 Text Interpretation:  Sinus or ectopic atrial tachycardia IVCD, consider atypical LBBB Artifact in lead(s) I II III aVR aVL and baseline wander in lead(s) V3 V4 No STEMI. ST changes likely rate-related.  Confirmed by Anders Hohmann MD, Willowdean Luhmann [phone]) on 10/09/2016 11:55:47 PM       ____________________________________________  RADIOLOGY  Dg Chest Portable 1 View  Result Date: 10/09/2016 CLINICAL DATA:  Severe dyspnea, cough and congestion EXAM: PORTABLE CHEST 1 VIEW COMPARISON:  10/03/2016 FINDINGS: Cardiomegaly with aortic atherosclerosis. Kerley B-lines consistent with CHF are identified bilaterally. Median sternotomy sutures and right-sided dialysis catheter tips are again visualized without significant change. Superimposed  airspace disease about the right hilum and right lung base cannot be entirely excluded. No acute osseous abnormality. IMPRESSION: Cardiomegaly with aortic atherosclerosis. Interval development of CHF. Superimposed airspace disease/pneumonia at the right lung  base and about the right hilum is not entirely excluded. Electronically Signed   By: Ashley Royalty M.D.   On: 10/09/2016 23:56    ____________________________________________   PROCEDURES  Procedure(s) performed:   Procedures  CRITICAL CARE Performed by: Margette Fast Total critical care time: 50 minutes Critical care time was exclusive of separately billable procedures and treating other patients. Critical care was necessary to treat or prevent imminent or life-threatening deterioration. Critical care was time spent personally by me on the following activities: development of treatment plan with patient and/or surrogate as well as nursing, discussions with consultants, evaluation of patient's response to treatment, examination of patient, obtaining history from patient or surrogate, ordering and performing treatments and interventions, ordering and review of laboratory studies, ordering and review of radiographic studies, pulse oximetry and re-evaluation of patient's condition.  Nanda Quinton, MD Emergency Medicine  ____________________________________________   INITIAL IMPRESSION / ASSESSMENT AND PLAN / ED COURSE  Pertinent labs & imaging results that were available during my care of the patient were reviewed by me and considered in my medical decision making (see chart for details).  Patient resents to the emergency department for evaluation of acute respiratory distress. The husband states that she had chest pain earlier that since resolved. Patient denies any chest pain currently. She is Experiencing difficulty breathing. Reports last dialysis was Friday and she feels like her fluids up. No fevers or chills. Patient has EKG that  shows sinus tachycardia and significant ST changes that I suspect are rate related. She has significantly elevated blood pressure and crackles on exam. Clinically the patient seems to either be having a severe COPD exacerbation or flash pulmonary edema. I plan to obtain lab work, give nitroglycerin, BiPAP, DuoNeb and reassess. Plan for reassessment and repeat EKG after rate slows. No STEMI criteria now.    11:50 PM Patient having BiPAP applied. Coaching through breathing with the machine. No significant improvement with Nitro. Patient feeling like she would like a breathing treatment which was ordered previously. Will give via BiPAP.   12:20 AM Spoke with Dr. Justin Mend who plans for HD tonight. Updated patient who looks much more comfortable at this time on BiPAP.   01:00 AM Discussed patient's case with hospitalist, Dr. Tamala Julian. Patient and family (if present) updated with plan. Care transferred to hospitalsit service.  I reviewed all nursing notes, vitals, pertinent old records, EKGs, labs, imaging (as available).  01:20 AM  BP down-trending nicely with nitro infusion. Patient continues to be awake and looks improved.  ____________________________________________  FINAL CLINICAL IMPRESSION(S) / ED DIAGNOSES  Final diagnoses:  Respiratory distress  Acute pulmonary edema (HCC)  ESRD (end stage renal disease) ([place])  Hypoxia     MEDICATIONS GIVEN DURING THIS VISIT:  Medications  nitroGLYCERIN (NITROSTAT) SL tablet 0.4 mg (0.4 mg Sublingual Given 10/09/16 2341)  aztreonam (AZACTAM) 2 g in dextrose 5 % 50 mL IVPB (2 g Intravenous New Bag/Given 10/10/16 0110)  vancomycin (VANCOCIN) IVPB 1000 mg/200 mL premix (not administered)  albuterol (PROVENTIL) (2.5 MG/3ML) 0.083% nebulizer solution 5 mg (5 mg Nebulization Given 10/09/16 2357)  ipratropium-albuterol (DUONEB) 0.5-2.5 (3) MG/3ML nebulizer solution 3 mL (3 mLs Nebulization Given 10/09/16 2357)  nitroGLYCERIN 50 mg in dextrose 5 % 250 mL (0.2 mg/mL)  infusion (45 mcg/min Intravenous Rate/Dose Verify 10/10/16 0114)     NEW OUTPATIENT MEDICATIONS STARTED DURING THIS VISIT:  None   Note:  This document was prepared using Dragon voice recognition software and may include unintentional dictation errors.  Nanda Quinton, MD Emergency Medicine   Margette Fast, MD 10/10/16 [phone]

## 2016-10-10 ENCOUNTER — Inpatient Hospital Stay (HOSPITAL_COMMUNITY): Payer: Medicare HMO

## 2016-10-10 ENCOUNTER — Encounter (HOSPITAL_COMMUNITY): Payer: Self-pay | Admitting: Emergency Medicine

## 2016-10-10 DIAGNOSIS — A419 Sepsis, unspecified organism: Secondary | ICD-10-CM | POA: Diagnosis not present

## 2016-10-10 DIAGNOSIS — I5032 Chronic diastolic (congestive) heart failure: Secondary | ICD-10-CM | POA: Diagnosis not present

## 2016-10-10 DIAGNOSIS — J9621 Acute and chronic respiratory failure with hypoxia: Secondary | ICD-10-CM | POA: Diagnosis not present

## 2016-10-10 DIAGNOSIS — I132 Hypertensive heart and chronic kidney disease with heart failure and with stage 5 chronic kidney disease, or end stage renal disease: Secondary | ICD-10-CM | POA: Diagnosis not present

## 2016-10-10 DIAGNOSIS — R000 Tachycardia, unspecified: Secondary | ICD-10-CM | POA: Diagnosis present

## 2016-10-10 DIAGNOSIS — R0902 Hypoxemia: Secondary | ICD-10-CM | POA: Diagnosis not present

## 2016-10-10 DIAGNOSIS — N186 End stage renal disease: Secondary | ICD-10-CM | POA: Diagnosis not present

## 2016-10-10 DIAGNOSIS — K219 Gastro-esophageal reflux disease without esophagitis: Secondary | ICD-10-CM | POA: Diagnosis present

## 2016-10-10 DIAGNOSIS — Z992 Dependence on renal dialysis: Secondary | ICD-10-CM | POA: Diagnosis not present

## 2016-10-10 DIAGNOSIS — R079 Chest pain, unspecified: Secondary | ICD-10-CM | POA: Diagnosis not present

## 2016-10-10 DIAGNOSIS — E7800 Pure hypercholesterolemia, unspecified: Secondary | ICD-10-CM | POA: Diagnosis present

## 2016-10-10 DIAGNOSIS — E785 Hyperlipidemia, unspecified: Secondary | ICD-10-CM | POA: Diagnosis present

## 2016-10-10 DIAGNOSIS — Z951 Presence of aortocoronary bypass graft: Secondary | ICD-10-CM | POA: Diagnosis not present

## 2016-10-10 DIAGNOSIS — I5033 Acute on chronic diastolic (congestive) heart failure: Secondary | ICD-10-CM | POA: Diagnosis not present

## 2016-10-10 DIAGNOSIS — Z8673 Personal history of transient ischemic attack (TIA), and cerebral infarction without residual deficits: Secondary | ICD-10-CM | POA: Diagnosis not present

## 2016-10-10 DIAGNOSIS — Z955 Presence of coronary angioplasty implant and graft: Secondary | ICD-10-CM | POA: Diagnosis not present

## 2016-10-10 DIAGNOSIS — Y95 Nosocomial condition: Secondary | ICD-10-CM | POA: Diagnosis present

## 2016-10-10 DIAGNOSIS — J810 Acute pulmonary edema: Secondary | ICD-10-CM | POA: Diagnosis not present

## 2016-10-10 DIAGNOSIS — J189 Pneumonia, unspecified organism: Secondary | ICD-10-CM | POA: Diagnosis not present

## 2016-10-10 DIAGNOSIS — T829XXA Unspecified complication of cardiac and vascular prosthetic device, implant and graft, initial encounter: Secondary | ICD-10-CM | POA: Insufficient documentation

## 2016-10-10 DIAGNOSIS — I2510 Atherosclerotic heart disease of native coronary artery without angina pectoris: Secondary | ICD-10-CM | POA: Diagnosis not present

## 2016-10-10 DIAGNOSIS — D631 Anemia in chronic kidney disease: Secondary | ICD-10-CM | POA: Diagnosis not present

## 2016-10-10 DIAGNOSIS — E8770 Fluid overload, unspecified: Secondary | ICD-10-CM | POA: Diagnosis present

## 2016-10-10 DIAGNOSIS — Z8711 Personal history of peptic ulcer disease: Secondary | ICD-10-CM | POA: Diagnosis not present

## 2016-10-10 DIAGNOSIS — N2581 Secondary hyperparathyroidism of renal origin: Secondary | ICD-10-CM | POA: Diagnosis not present

## 2016-10-10 DIAGNOSIS — E875 Hyperkalemia: Secondary | ICD-10-CM | POA: Diagnosis not present

## 2016-10-10 DIAGNOSIS — I160 Hypertensive urgency: Secondary | ICD-10-CM | POA: Diagnosis not present

## 2016-10-10 DIAGNOSIS — Z886 Allergy status to analgesic agent status: Secondary | ICD-10-CM | POA: Diagnosis not present

## 2016-10-10 DIAGNOSIS — I161 Hypertensive emergency: Secondary | ICD-10-CM | POA: Diagnosis not present

## 2016-10-10 DIAGNOSIS — Z8543 Personal history of malignant neoplasm of ovary: Secondary | ICD-10-CM | POA: Diagnosis not present

## 2016-10-10 DIAGNOSIS — I480 Paroxysmal atrial fibrillation: Secondary | ICD-10-CM | POA: Diagnosis not present

## 2016-10-10 DIAGNOSIS — I120 Hypertensive chronic kidney disease with stage 5 chronic kidney disease or end stage renal disease: Secondary | ICD-10-CM | POA: Diagnosis not present

## 2016-10-10 DIAGNOSIS — I447 Left bundle-branch block, unspecified: Secondary | ICD-10-CM | POA: Diagnosis present

## 2016-10-10 DIAGNOSIS — E8889 Other specified metabolic disorders: Secondary | ICD-10-CM | POA: Diagnosis not present

## 2016-10-10 DIAGNOSIS — I252 Old myocardial infarction: Secondary | ICD-10-CM | POA: Diagnosis not present

## 2016-10-10 DIAGNOSIS — I214 Non-ST elevation (NSTEMI) myocardial infarction: Secondary | ICD-10-CM | POA: Diagnosis not present

## 2016-10-10 HISTORY — DX: Acute and chronic respiratory failure with hypoxia: J96.21

## 2016-10-10 LAB — BASIC METABOLIC PANEL
Anion gap: 17 — ABNORMAL HIGH (ref 5–15)
BUN: 59 mg/dL — ABNORMAL HIGH (ref 6–20)
CALCIUM: 9.2 mg/dL (ref 8.9–10.3)
CO2: 26 mmol/L (ref 22–32)
CREATININE: 10.41 mg/dL — AB (ref 0.44–1.00)
Chloride: 97 mmol/L — ABNORMAL LOW (ref 101–111)
GFR calc Af Amer: 4 mL/min — ABNORMAL LOW (ref >60)
GFR, EST NON AFRICAN AMERICAN: 3 mL/min — AB (ref >60)
Glucose, Bld: 140 mg/dL — ABNORMAL HIGH (ref 65–99)
POTASSIUM: 5.1 mmol/L (ref 3.5–5.1)
SODIUM: 140 mmol/L (ref 135–145)

## 2016-10-10 LAB — PROCALCITONIN: Procalcitonin: 0.55 ng/mL

## 2016-10-10 LAB — COMPREHENSIVE METABOLIC PANEL WITH GFR
ALT: 29 U/L (ref 14–54)
AST: 28 U/L (ref 15–41)
Albumin: 4.2 g/dL (ref 3.5–5.0)
Alkaline Phosphatase: 97 U/L (ref 38–126)
Anion gap: 20 — ABNORMAL HIGH (ref 5–15)
BUN: 54 mg/dL — ABNORMAL HIGH (ref 6–20)
CO2: 24 mmol/L (ref 22–32)
Calcium: 9.7 mg/dL (ref 8.9–10.3)
Chloride: 96 mmol/L — ABNORMAL LOW (ref 101–111)
Creatinine, Ser: 9.96 mg/dL — ABNORMAL HIGH (ref 0.44–1.00)
GFR calc Af Amer: 4 mL/min — ABNORMAL LOW (ref >60)
GFR calc non Af Amer: 3 mL/min — ABNORMAL LOW (ref >60)
Glucose, Bld: 139 mg/dL — ABNORMAL HIGH (ref 65–99)
Potassium: 4.8 mmol/L (ref 3.5–5.1)
Sodium: 140 mmol/L (ref 135–145)
Total Bilirubin: 0.7 mg/dL (ref 0.3–1.2)
Total Protein: 7.2 g/dL (ref 6.5–8.1)

## 2016-10-10 LAB — CBC
HCT: 34.2 % — ABNORMAL LOW (ref 36.0–46.0)
Hemoglobin: 10.7 g/dL — ABNORMAL LOW (ref 12.0–15.0)
MCH: 30.6 pg (ref 26.0–34.0)
MCHC: 31.3 g/dL (ref 30.0–36.0)
MCV: 97.7 fL (ref 78.0–100.0)
PLATELETS: 199 10*3/uL (ref 150–400)
RBC: 3.5 MIL/uL — AB (ref 3.87–5.11)
RDW: 17.8 % — AB (ref 11.5–15.5)
WBC: 12.5 10*3/uL — ABNORMAL HIGH (ref 4.0–10.5)

## 2016-10-10 LAB — LACTIC ACID, PLASMA
LACTIC ACID, VENOUS: 2.2 mmol/L — AB (ref 0.5–1.9)
Lactic Acid, Venous: 2.1 mmol/L (ref 0.5–1.9)

## 2016-10-10 LAB — TROPONIN I
Troponin I: 9.32 ng/mL (ref <0.03)
Troponin I: 23.73 ng/mL (ref <0.03)

## 2016-10-10 LAB — HEPATITIS B SURFACE ANTIGEN: Hepatitis B Surface Ag: NEGATIVE

## 2016-10-10 IMAGING — DX DG CHEST 1V PORT
1 series · 1 of 1 positions shown · non-contrast
Comparison: Prior radiograph from [DATE].

CLINICAL DATA: Initial evaluation for pulmonary edema. Shortness of
breath with chest pain.

EXAM:
PORTABLE CHEST 1 VIEW

[chest ap]
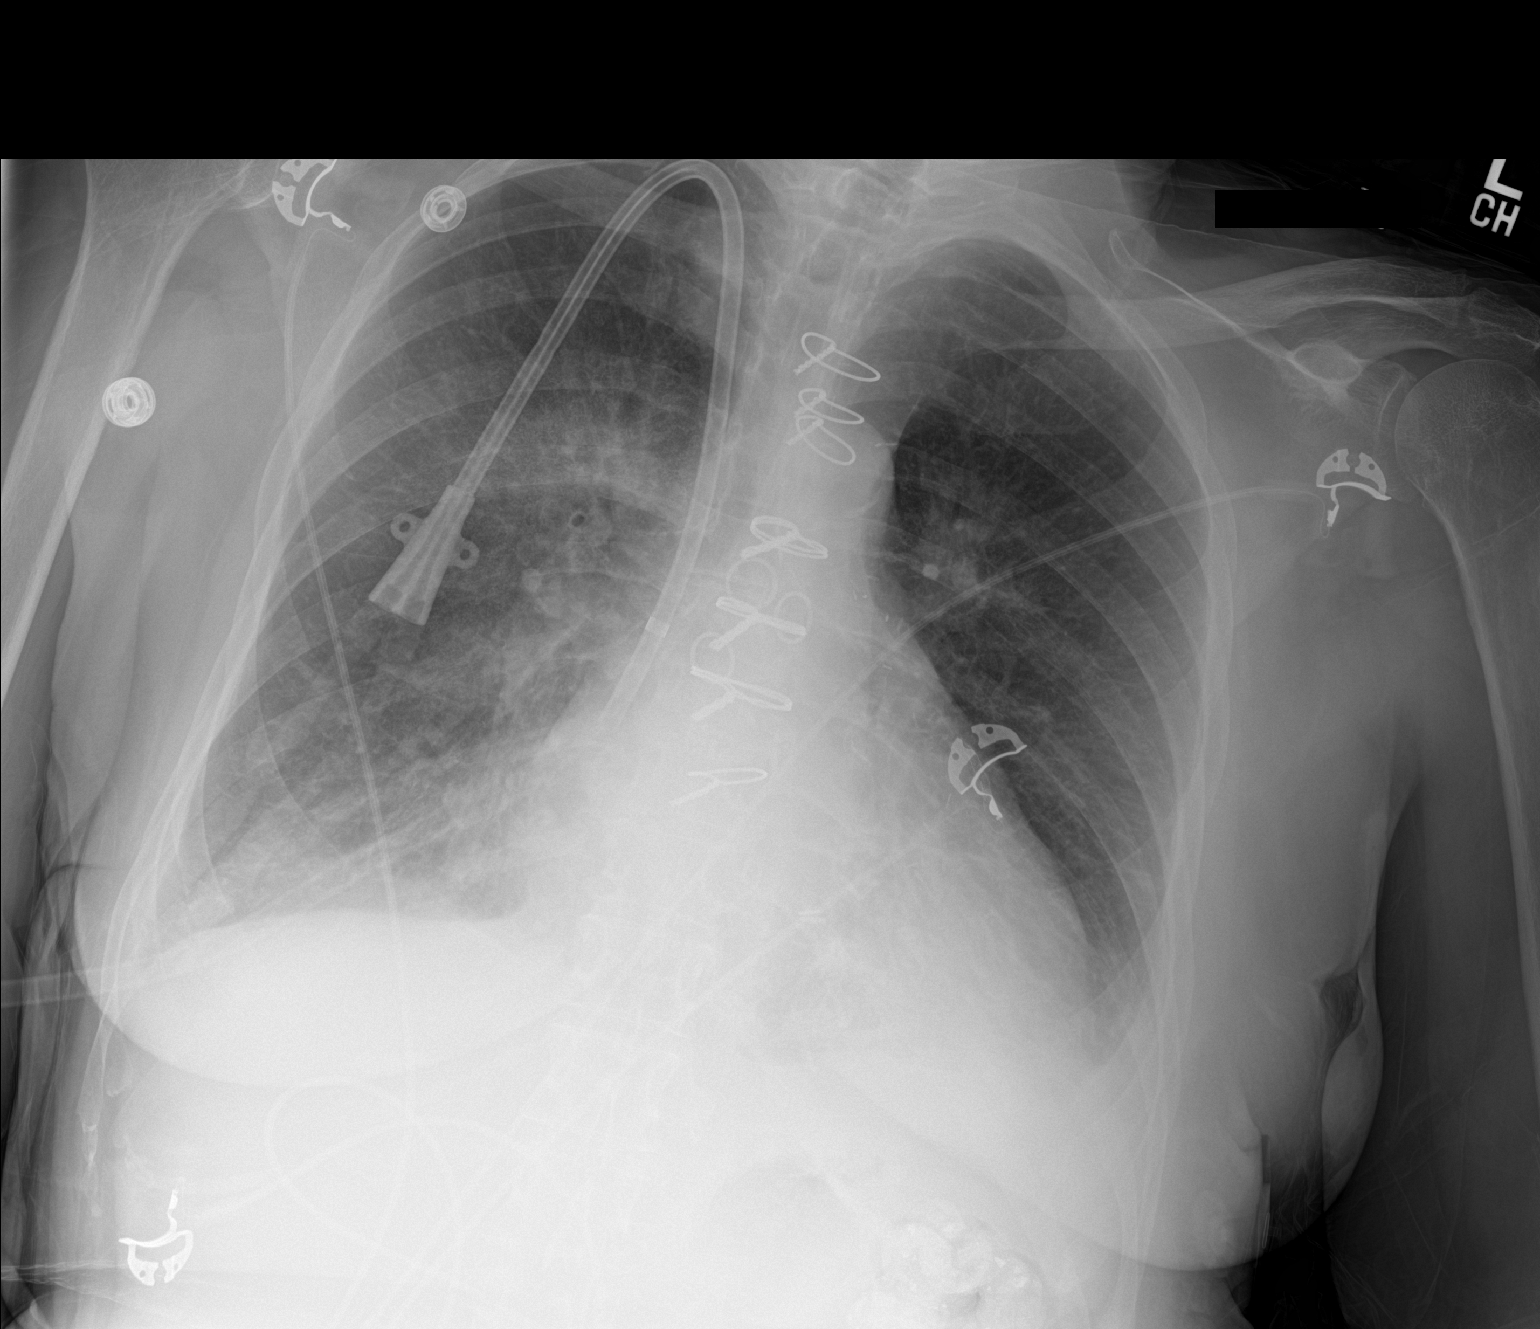

[1 of 1 positions shown; findings below may reference images not displayed]

FINDINGS: A right-sided dual-lumen dialysis catheter in place, stable. Median
sternotomy wires with underlying CABG markers and surgical clips
unchanged. Stable cardiomegaly. Mediastinal silhouette within normal
limits. Aortic atherosclerosis.

Lungs normally inflated. Diffuse vascular congestion with scattered
interstitial prominence in Kerley B-lines, compatible pulmonary
edema, worse within the right lung. Small bilateral pleural
effusions, left greater than right. No focal infiltrates. No
pneumothorax.

No acute osseous abnormality.
IMPRESSION: 1. Cardiomegaly with mild diffuse pulmonary edema and small
bilateral pleural effusions, consistent with acute CHF exacerbation.
2. Sequelae of prior CABG.
3. Aortic atherosclerosis.

## 2016-10-10 MED ORDER — ALBUTEROL SULFATE (2.5 MG/3ML) 0.083% IN NEBU
2.5000 mg | INHALATION_SOLUTION | RESPIRATORY_TRACT | Status: DC | PRN
Start: 2016-10-10 — End: 2016-10-14

## 2016-10-10 MED ORDER — DEXTROSE 5 % IV SOLN
2.0000 g | Freq: Once | INTRAVENOUS | Status: AC
  Administered 2016-10-10: 2 g via INTRAVENOUS
  Filled 2016-10-10: qty 2

## 2016-10-10 MED ORDER — DEXTROSE 5 % IV SOLN: 2.0000 g | Freq: Once | INTRAVENOUS | Status: DC

## 2016-10-10 MED ORDER — HEPARIN SODIUM (PORCINE) 1000 UNIT/ML DIALYSIS: 1000.0000 [IU] | INTRAMUSCULAR | Status: DC | PRN

## 2016-10-10 MED ORDER — LANTHANUM CARBONATE 500 MG PO CHEW
1000.0000 mg | CHEWABLE_TABLET | Freq: Three times a day (TID) | ORAL | Status: DC
  Administered 2016-10-10 – 2016-10-14 (×10): 1000 mg via ORAL
  Filled 2016-10-10 (×14): qty 2

## 2016-10-10 MED ORDER — PANTOPRAZOLE SODIUM 40 MG PO TBEC: 40.0000 mg | DELAYED_RELEASE_TABLET | Freq: Every day | ORAL | Status: DC

## 2016-10-10 MED ORDER — LIDOCAINE-PRILOCAINE 2.5-2.5 % EX CREA: 1.0000 "application " | TOPICAL_CREAM | CUTANEOUS | Status: DC | PRN

## 2016-10-10 MED ORDER — VANCOMYCIN HCL IN DEXTROSE 750-5 MG/150ML-% IV SOLN
750.0000 mg | INTRAVENOUS | Status: AC
  Administered 2016-10-10: 750 mg via INTRAVENOUS

## 2016-10-10 MED ORDER — ALPRAZOLAM 0.25 MG PO TABS
0.2500 mg | ORAL_TABLET | Freq: Two times a day (BID) | ORAL | Status: DC | PRN
  Administered 2016-10-13: 0.25 mg via ORAL
  Filled 2016-10-10: qty 1

## 2016-10-10 MED ORDER — PENTAFLUOROPROP-TETRAFLUOROETH EX AERO: 1.0000 "application " | INHALATION_SPRAY | CUTANEOUS | Status: DC | PRN

## 2016-10-10 MED ORDER — VANCOMYCIN HCL IN DEXTROSE 750-5 MG/150ML-% IV SOLN
INTRAVENOUS | Status: AC
  Filled 2016-10-10: qty 150

## 2016-10-10 MED ORDER — DEXTROSE 5 % IV SOLN
1.0000 g | INTRAVENOUS | Status: DC
  Filled 2016-10-10: qty 1

## 2016-10-10 MED ORDER — CARVEDILOL 3.125 MG PO TABS
3.1250 mg | ORAL_TABLET | Freq: Two times a day (BID) | ORAL | Status: DC
  Administered 2016-10-10 – 2016-10-14 (×7): 3.125 mg via ORAL
  Filled 2016-10-10 (×9): qty 1

## 2016-10-10 MED ORDER — ASPIRIN EC 81 MG PO TBEC
81.0000 mg | DELAYED_RELEASE_TABLET | Freq: Every day | ORAL | Status: DC
  Administered 2016-10-10 – 2016-10-12 (×3): 81 mg via ORAL
  Filled 2016-10-10 (×3): qty 1

## 2016-10-10 MED ORDER — METOPROLOL TARTRATE 25 MG PO TABS: 12.5000 mg | ORAL_TABLET | Freq: Two times a day (BID) | ORAL | Status: DC

## 2016-10-10 MED ORDER — RENA-VITE PO TABS
1.0000 | ORAL_TABLET | Freq: Every day | ORAL | Status: DC
  Administered 2016-10-11 – 2016-10-13 (×3): 1 via ORAL
  Filled 2016-10-10 (×3): qty 1

## 2016-10-10 MED ORDER — CLONIDINE HCL 0.1 MG PO TABS
0.2000 mg | ORAL_TABLET | Freq: Two times a day (BID) | ORAL | Status: DC
  Administered 2016-10-10 – 2016-10-13 (×6): 0.2 mg via ORAL
  Administered 2016-10-13 (×2): 0.1 mg via ORAL
  Administered 2016-10-14: 09:00:00 0.2 mg via ORAL
  Filled 2016-10-10 (×5): qty 1
  Filled 2016-10-10 (×2): qty 2
  Filled 2016-10-10: qty 1
  Filled 2016-10-10: qty 2

## 2016-10-10 MED ORDER — AMIODARONE HCL 200 MG PO TABS
200.0000 mg | ORAL_TABLET | Freq: Every day | ORAL | Status: DC
Start: 2016-10-10 — End: 2016-10-14
  Administered 2016-10-10 – 2016-10-14 (×5): 200 mg via ORAL
  Filled 2016-10-10 (×5): qty 1

## 2016-10-10 MED ORDER — VANCOMYCIN HCL 500 MG IV SOLR
500.0000 mg | INTRAVENOUS | Status: DC
  Filled 2016-10-10: qty 500

## 2016-10-10 MED ORDER — SIMVASTATIN 20 MG PO TABS: 20.0000 mg | ORAL_TABLET | Freq: Every day | ORAL | Status: DC

## 2016-10-10 MED ORDER — SODIUM CHLORIDE 0.9 % IV SOLN: 100.0000 mL | INTRAVENOUS | Status: DC | PRN

## 2016-10-10 MED ORDER — DEXTROSE 5 % IV SOLN: 1.0000 g | INTRAVENOUS | Status: DC

## 2016-10-10 MED ORDER — HYDRALAZINE HCL 50 MG PO TABS
75.0000 mg | ORAL_TABLET | Freq: Three times a day (TID) | ORAL | Status: DC
  Administered 2016-10-10 – 2016-10-14 (×10): 75 mg via ORAL
  Filled 2016-10-10 (×11): qty 1

## 2016-10-10 MED ORDER — DOXERCALCIFEROL 4 MCG/2ML IV SOLN
INTRAVENOUS | Status: AC
  Filled 2016-10-10: qty 4

## 2016-10-10 MED ORDER — ALTEPLASE 2 MG IJ SOLR: 2.0000 mg | Freq: Once | INTRAMUSCULAR | Status: DC | PRN

## 2016-10-10 MED ORDER — CINACALCET HCL 30 MG PO TABS
30.0000 mg | ORAL_TABLET | Freq: Every day | ORAL | Status: DC
  Administered 2016-10-10 – 2016-10-14 (×5): 30 mg via ORAL
  Filled 2016-10-10 (×6): qty 1

## 2016-10-10 MED ORDER — ONDANSETRON HCL 4 MG/2ML IJ SOLN
4.0000 mg | Freq: Four times a day (QID) | INTRAMUSCULAR | Status: DC | PRN
  Administered 2016-10-11: 4 mg via INTRAVENOUS
  Filled 2016-10-10: qty 2

## 2016-10-10 MED ORDER — SODIUM CHLORIDE 0.9% FLUSH
3.0000 mL | Freq: Two times a day (BID) | INTRAVENOUS | Status: DC
  Administered 2016-10-10 – 2016-10-12 (×4): 3 mL via INTRAVENOUS

## 2016-10-10 MED ORDER — ATORVASTATIN CALCIUM 40 MG PO TABS
40.0000 mg | ORAL_TABLET | Freq: Every day | ORAL | Status: DC
  Administered 2016-10-10 – 2016-10-12 (×3): 40 mg via ORAL
  Filled 2016-10-10 (×4): qty 1

## 2016-10-10 MED ORDER — NITROGLYCERIN 0.4 MG SL SUBL
SUBLINGUAL_TABLET | SUBLINGUAL | Status: AC
  Filled 2016-10-10: qty 1

## 2016-10-10 MED ORDER — PANTOPRAZOLE SODIUM 40 MG PO TBEC
40.0000 mg | DELAYED_RELEASE_TABLET | Freq: Every day | ORAL | Status: DC
  Administered 2016-10-10 – 2016-10-14 (×5): 40 mg via ORAL
  Filled 2016-10-10 (×5): qty 1

## 2016-10-10 MED ORDER — LIDOCAINE HCL (PF) 1 % IJ SOLN: 5.0000 mL | INTRAMUSCULAR | Status: DC | PRN

## 2016-10-10 MED ORDER — VANCOMYCIN HCL 500 MG IV SOLR: 500.0000 mg | INTRAVENOUS | Status: DC

## 2016-10-10 MED ORDER — AMLODIPINE BESYLATE 2.5 MG PO TABS
2.5000 mg | ORAL_TABLET | Freq: Every day | ORAL | Status: DC
  Administered 2016-10-10: 2.5 mg via ORAL
  Filled 2016-10-10 (×2): qty 1

## 2016-10-10 MED ORDER — HEPARIN SODIUM (PORCINE) 5000 UNIT/ML IJ SOLN
5000.0000 [IU] | Freq: Three times a day (TID) | INTRAMUSCULAR | Status: DC
  Administered 2016-10-11 – 2016-10-12 (×2): 5000 [IU] via SUBCUTANEOUS
  Filled 2016-10-10 (×3): qty 1

## 2016-10-10 MED ORDER — DOXERCALCIFEROL 4 MCG/2ML IV SOLN
7.0000 ug | INTRAVENOUS | Status: DC
  Administered 2016-10-10 – 2016-10-12 (×2): 7 ug via INTRAVENOUS
  Filled 2016-10-10 (×2): qty 4

## 2016-10-10 MED ORDER — NITROGLYCERIN IN D5W 200-5 MCG/ML-% IV SOLN
0.0000 ug/min | Freq: Once | INTRAVENOUS | Status: AC
  Administered 2016-10-10: 25 ug/min via INTRAVENOUS
  Filled 2016-10-10: qty 250

## 2016-10-10 MED ORDER — ONDANSETRON HCL 4 MG PO TABS: 4.0000 mg | ORAL_TABLET | Freq: Four times a day (QID) | ORAL | Status: DC | PRN

## 2016-10-10 MED ORDER — ISOSORBIDE MONONITRATE ER 60 MG PO TB24
120.0000 mg | ORAL_TABLET | Freq: Every day | ORAL | Status: DC
  Administered 2016-10-10 – 2016-10-14 (×5): 120 mg via ORAL
  Filled 2016-10-10 (×5): qty 2

## 2016-10-10 NOTE — Progress Notes (Signed)
Pharmacy Antibiotic Note  Shawna [name] is a 76 y.o. female with ESRD admitted on 10/09/2016 with SOB/PNA.  Pharmacy has been consulted for Vancomycin and Cefepime.  The patient did not get a full Vancomycin load or a dose of Cefepime pre-HD today. Will adjust the doses slightly to make up for this. Receiving HD 4/2 and possibly again off-schedule per renal if more volume removal needed.   Plan: 1. Vancomycin 750 mg x 1 dose post HD today 2. Start Vancomycin 500 mg/HD-MWF on Wed, 4/4 3. Cefepime 2g IV x 1 dose post HD today 4. Cefepime 1g IV every 24 hours starting on 4/3 (if no extra HD planned - will plan to adjust to be give post-HD sessions) 5. Will continue to follow HD schedule/duration, culture results, LOT, and antibiotic de-escalation plans   Height: 5\' 2"  (157.5 cm) Weight:  (Pt on stretcher, refused standing wt. too weak to stand up.) IBW/kg (Calculated) : 50.1  Temp (24hrs), Avg:97.9 F (36.6 C), Min:97.3 F (36.3 C), Max:98.5 F (36.9 C)   Recent Labs Lab 10/09/16 2325 10/09/16 2337 10/10/16 0450 10/10/16 0723  WBC 12.0*  --  12.5*  --   CREATININE 9.96*  --  10.41*  --   LATICACIDVEN  --  2.00* 2.2* 2.1*    Estimated Creatinine Clearance: 3.6 mL/min (A) (by C-G formula based on SCr of 10.41 mg/dL (H)).    Vanc 4/2 >> Azactam 4/2 x 1 Cefepime 4/2 >>  4/2 BCx >>   Thank you for allowing pharmacy to be a part of this patient's care.  Alycia Rossetti, PharmD, BCPS Clinical Pharmacist Pager: [phone] Clinical phone for 10/10/2016 from 7a-3:30p: [phone] If after 3:30p, please call main pharmacy at: [phone] 10/10/2016 10:48 AM

## 2016-10-10 NOTE — Progress Notes (Signed)
Coarse crackles heard throughout. Pt is on NIV at this time tolerating it [place] well. Pt is Anxious

## 2016-10-10 NOTE — Progress Notes (Signed)
Subjective:    Patient ID: Shawna [name], female    DOB: 14-Jul-1940, 76 y.o.   MRN: [id number] Chief Complaint  Patient presents with  . New Evaluation    Vein mapping    Presents as a new patient referred by Dr. Ashley Akin for "dialysis access creation". Patient has an extensive history of access creations / failures. She is currently being maintained by a right IJ permcath without issue. She is right hand dominate. She dialyzes M-W-F. Her access history is as follows:  Occluded left brachial cephalic AV fistula Right AVG with immediate removal due to steal syndrome  Right Upper Extremity Access?  Patient has an extensive cardiac history with included cardiac arrest.   Review of Systems  Constitutional: Negative.   HENT: Negative.   Eyes: Negative.   Respiratory: Negative.   Cardiovascular:       Extensive Cardiac History  Gastrointestinal: Negative.   Endocrine: Negative.   Genitourinary:       ESRD  Musculoskeletal: Negative.   Skin: Negative.   Allergic/Immunologic: Negative.   Neurological: Negative.   Hematological: Negative.   Psychiatric/Behavioral: Negative.       Objective:   Physical Exam  Constitutional: She appears well-developed and well-nourished.  HENT:  Head: Normocephalic and atraumatic.  Eyes: Conjunctivae are normal. Pupils are equal, round, and reactive to light.  Neck: Normal range of motion.  Bilateral bruits noted.   Cardiovascular: Normal rate, normal heart sounds and intact distal pulses.   Pulses:      Radial pulses are 2+ on the right side, and 2+ on the left side.  Pulmonary/Chest: Effort normal.  Right Permcath Intact: no signs of infection.   Musculoskeletal: Normal range of motion. She exhibits no edema.  Neurological: She is alert.  Skin:  Multiple incisions noted on upper extremities.   Psychiatric: She has a normal mood and affect. Her behavior is normal. Judgment and thought content normal.   BP (!) 147/62 (BP Location: Left  Arm)   Pulse (!) 55   Resp 16   Ht 5\' 2"  (1.575 m)   Wt 129 lb (58.5 kg)   BMI 23.59 kg/m   Past Medical History:  Diagnosis Date  . Acute on chronic respiratory failure with hypoxia ([place]) 10/10/2016  . Anxiety   . Arthritis   . AVM (arteriovenous malformation) of colon   . CAD (coronary artery disease)    a. 12/2011 NSTEMI/Cath/PCI LCX (2.25x14 Resolute DES) & D1 (2.25x22 Resolute DES);  b. 01/2012 Cath/PCI: LM 30, LAD 30p, 40-47m, D1 stent ok, 99 in sm branch of diag, LCX patent stent, OM1 20, RCA 95 ost (4.0x12 Promus DES), EF 55%;  c. 04/2012 Lexi Cardiolite  EF 48%, small area of scar @ base/mid inflat wall with mild peri-infarct ischemia.; CABG 12/4  . Carotid artery disease ([place])    a. 63-33% LICA, 11/4560   . Chronic bronchitis ([place])   . Chronic diastolic CHF (congestive heart failure) ([place])    a. 02/2012 Echo EF 60-65%, nl wall motion, Gr 1 DD, mod MR  . Colon cancer ([place]) 1992  . Esophageal stricture   . ESRD on hemodialysis ([place])    ESRD due to HTN, started dialysis 2011 and gets HD at [facility] with Dr Hinda Lenis on MWF schedule.  Access is LUA AVF as of Sept 2014.   Marland Kitchen GERD (gastroesophageal reflux disease)   . High cholesterol 12/2011  . History of blood transfusion 07/2011; 12/2011; 01/2012 X 2; 04/2012  . History of  gout   . History of lower GI bleeding   . Hypertension   . Iron deficiency anemia   . Mitral regurgitation    a. Moderate by echo, 02/2012  . Myocardial infarction   . Ovarian cancer ([place]) 1992  . Pneumonia ~ 2009  . PUD (peptic ulcer disease)   . TIA (transient ischemic attack)    Social History   Social History  . Marital status: Married    Spouse name: N/A  . Number of children: N/A  . Years of education: N/A   Occupational History  . Not on file.   Social History Main Topics  . Smoking status: Never Smoker  . Smokeless tobacco: Never Used  . Alcohol use No  . Drug use: No  . Sexual activity: Yes    Birth control/ protection: Surgical    Other Topics Concern  . Not on file   Social History Narrative   Lives in [place], [place] with husband.  Dialysis pt - mwf.   Past Surgical History:  Procedure Laterality Date  . ABDOMINAL HYSTERECTOMY  1992  . APPENDECTOMY  06/1990  . AV FISTULA PLACEMENT  07/2009   left upper arm  . AV FISTULA PLACEMENT Right 09/06/2016   Procedure: RIGHT FOREARM ARTERIOVENOUS (AV) GRAFT;  Surgeon: Elam Dutch, MD;  Location: [place];  Service: Vascular;  Laterality: Right;  . [place] REMOVAL Right 09/06/2016   Procedure: REMOVAL OF Right Arm ARTERIOVENOUS GORETEX GRAFT and Vein Patch angioplasty of brachial artery;  Surgeon: Angelia Mould, MD;  Location: [place];  Service: Vascular;  Laterality: Right;  . COLON RESECTION  1992  . CORONARY ANGIOPLASTY WITH STENT PLACEMENT  12/15/11   "2"  . CORONARY ANGIOPLASTY WITH STENT PLACEMENT  y/2013   "1; makes total of 3" (05/02/2012)  . CORONARY ARTERY BYPASS GRAFT  06/13/2012   Procedure: CORONARY ARTERY BYPASS GRAFTING (CABG);  Surgeon: Grace Isaac, MD;  Location: [name];  Service: Open Heart Surgery;  Laterality: N/A;  cabg x four;  using left internal mammary artery, and left leg greater saphenous vein harvested endoscopically  . DILATION AND CURETTAGE OF UTERUS    . ESOPHAGOGASTRODUODENOSCOPY  01/20/2012   Procedure: ESOPHAGOGASTRODUODENOSCOPY (EGD);  Surgeon: Ladene Artist, MD,FACG;  Location: [facility] ENDOSCOPY;  Service: Endoscopy;  Laterality: N/A;  . ESOPHAGOGASTRODUODENOSCOPY N/A 03/26/2013   Procedure: ESOPHAGOGASTRODUODENOSCOPY (EGD);  Surgeon: Irene Shipper, MD;  Location: [facility] ENDOSCOPY;  Service: Endoscopy;  Laterality: N/A;  . ESOPHAGOGASTRODUODENOSCOPY N/A 04/30/2015   Procedure: ESOPHAGOGASTRODUODENOSCOPY (EGD);  Surgeon: Rogene Houston, MD;  [facility];  Service: Endoscopy;  Laterality: N/A;  1pm - moved to 10/20 @ 1:10  . ESOPHAGOGASTRODUODENOSCOPY N/A 07/29/2016   Procedure: ESOPHAGOGASTRODUODENOSCOPY (EGD);  Surgeon: Manus Gunning, MD;  Location: [place];  Service: Gastroenterology;  Laterality: N/A;  enteroscopy  . INTRAOPERATIVE TRANSESOPHAGEAL ECHOCARDIOGRAM  06/13/2012   Procedure: INTRAOPERATIVE TRANSESOPHAGEAL ECHOCARDIOGRAM;  Surgeon: Grace Isaac, MD;  Location: [place];  Service: Open Heart Surgery;  Laterality: N/A;  . IR GENERIC HISTORICAL  07/26/2016   IR FLUORO GUIDE CV LINE RIGHT 07/26/2016 Greggory Keen, MD [facility]INTER[facility]  . IR GENERIC HISTORICAL  07/26/2016   IR US GUIDE VASC ACCESS RIGHT 07/26/2016 Greggory Keen, MD [facility]  . IR GENERIC HISTORICAL  08/02/2016   IR US GUIDE VASC ACCESS RIGHT 08/02/2016 Greggory Keen, MD [facility]  . IR GENERIC HISTORICAL  08/02/2016   IR FLUORO GUIDE CV LINE RIGHT 08/02/2016 Greggory Keen, MD [facility]  .  LEFT HEART CATH AND CORONARY ANGIOGRAPHY N/A 09/20/2016   Procedure: Left Heart Cath and Coronary Angiography;  Surgeon: Shawna Crome, MD;  Location: [place] CV LAB;  Service: Cardiovascular;  Laterality: N/A;  . LEFT HEART CATHETERIZATION WITH CORONARY ANGIOGRAM N/A 12/15/2011   Procedure: LEFT HEART CATHETERIZATION WITH CORONARY ANGIOGRAM;  Surgeon: Burnell Blanks, MD;  Location: [facility] CATH LAB;  Service: Cardiovascular;  Laterality: N/A;  . LEFT HEART CATHETERIZATION WITH CORONARY ANGIOGRAM N/A 01/10/2012   Procedure: LEFT HEART CATHETERIZATION WITH CORONARY ANGIOGRAM;  Surgeon: Peter M Martinique, MD;  Location: [facility] CATH LAB;  Service: Cardiovascular;  Laterality: N/A;  . LEFT HEART CATHETERIZATION WITH CORONARY ANGIOGRAM N/A 06/08/2012   Procedure: LEFT HEART CATHETERIZATION WITH CORONARY ANGIOGRAM;  Surgeon: Burnell Blanks, MD;  Location: [facility] CATH LAB;  Service: Cardiovascular;  Laterality: N/A;  . LEFT HEART CATHETERIZATION WITH CORONARY/GRAFT ANGIOGRAM N/A 12/10/2013   Procedure: LEFT HEART CATHETERIZATION WITH Beatrix Fetters;  Surgeon: [name], MD;  Location: [facility] CATH LAB;  Service: Cardiovascular;  Laterality:  N/A;  . OVARY SURGERY     ovarian cancer  . SHUNTOGRAM N/A 10/15/2013   Procedure: Fistulogram;  Surgeon: Serafina Mitchell, MD;  Location: [facility] CATH LAB;  Service: Cardiovascular;  Laterality: N/A;  . THROMBECTOMY / ARTERIOVENOUS GRAFT REVISION  2011   left upper arm  . TUBAL LIGATION  1980's   Family History  Problem Relation Age of Onset  . Heart disease Mother     Heart Disease before age 60  . Hyperlipidemia Mother   . Hypertension Mother   . Diabetes Mother   . Heart attack Mother   . Heart disease Father     Heart Disease before age 70  . Hyperlipidemia Father   . Hypertension Father   . Diabetes Father   . Diabetes Sister   . Hypertension Sister   . Diabetes Brother   . Hyperlipidemia Brother   . Heart attack Brother   . Hypertension Sister   . Heart attack Brother   . Other      noncontributory for early CAD  . Colon cancer Neg Hx   . Esophageal cancer Neg Hx   . Liver disease Neg Hx   . Kidney disease Neg Hx   . Colon polyps Neg Hx    Allergies  Allergen Reactions  . Aspirin Other (See Comments)    High Doses Mess up her stomach; "makes my bowels have blood in them". Takes 81 mg EC Aspirin   . Penicillins Other (See Comments)    SYNCOPE? , "makes me real weak when I take it; like I'll pass out"  Has patient had a PCN reaction causing immediate rash, facial/tongue/throat swelling, SOB or lightheadedness with hypotension: no Has patient had a PCN reaction causing severe rash involving mucus membranes or skin necrosis: no Has patient had a PCN reaction that required hospitalization no Has patient had a PCN reaction occurring within the last 10 years: no If all of the above  . Amlodipine Swelling and Other (See Comments)    Leg swelling  . Bactrim [Sulfamethoxazole-Trimethoprim] Rash  . Contrast Media [Iodinated Diagnostic Agents] Itching  . Iron Itching and Other (See Comments)    "they gave me iron in dialysis; had to give me Benadryl cause I had to have the  iron" (05/02/2012)  . Nitrofurantoin Hives  . Prilosec [Omeprazole] Other (See Comments)    "back spasms"  . Tylenol [Acetaminophen] Other (See Comments)    Makes her feet on  fire per pt  . Dexilant [Dexlansoprazole] Other (See Comments)    Upset stomach  . Levaquin [Levofloxacin In D5w] Rash  . Morphine And Related Itching    Itching in feet  . Plavix [Clopidogrel Bisulfate] Rash  . Protonix [Pantoprazole Sodium] Rash  . Venofer [Ferric Oxide] Itching    Patient reports using Benadryl prior to doses as [place]:  Presents as a new patient referred by Dr. Ashley Akin for "dialysis access creation". Patient has an extensive history of access creations / failures. She is currently being maintained by a right IJ permcath without issue. She is right hand dominate. She dialyzes M-W-F. Her access history is as follows:  Occluded left brachial cephalic AV fistula Right AVG with immediate removal due to steal syndrome  Right Upper Extremity Access?  Patient has an extensive cardiac history with included cardiac arrest.  1. ESRD (end stage renal disease) ([place]) - Stable Patient being maintained by right permcath. Dialyze as per normal.   2. Complication from renal dialysis device, initial encounter - New Patient is in need of new permanent access. She has a complicated past of failed accesses Recommend bilateral venogram to assess anatomy for possible creation of HeRO graft. Procedure risks and benefits explained to patient. All questions answered. Patient is willing to proceed.   3. Cardiac arrest ([place]) - Stable With patients cardiac history I would recommend cardiac clearance for both venogram and surgery Will speak with Dr. Delana Meyer  No current facility-administered medications on file prior to visit.    Current Outpatient Prescriptions on File Prior to Visit  Medication Sig Dispense Refill  . ALPRAZolam (XANAX) 0.25 MG tablet Take 0.25 mg by mouth 2  (two) times daily as needed for anxiety.     Marland Kitchen amiodarone (PACERONE) 200 MG tablet Take 1 tablet (200 mg total) by mouth daily. 30 tablet 6  . amLODipine (NORVASC) 2.5 MG tablet Take 2.5 mg by mouth daily.    Marland Kitchen aspirin EC 81 MG tablet Take 81 mg by mouth daily.     Marland Kitchen atorvastatin (LIPITOR) 40 MG tablet Take 1 tablet (40 mg total) by mouth daily at 6 PM. (Patient taking differently: Take 40 mg by mouth daily. ) 30 tablet 6  . carvedilol (COREG) 3.125 MG tablet Take 1 tablet (3.125 mg total) by mouth 2 (two) times daily with a meal. 60 tablet 2  . cloNIDine (CATAPRES) 0.2 MG tablet Take 1 tablet (0.2 mg total) by mouth 2 (two) times daily. 60 tablet 3  . epoetin alfa (EPOGEN,PROCRIT) 85027 UNIT/ML injection Inject 1 mL (10,000 Units total) into the vein every Monday, Wednesday, and Friday with hemodialysis. 1 mL 10  . fluticasone (FLONASE) 50 MCG/ACT nasal spray Place 2 sprays into the nose daily as needed for allergies.     . hydrALAZINE (APRESOLINE) 50 MG tablet Take 1.5 tablets (75 mg total) by mouth 3 (three) times daily. 135 tablet 6  . isosorbide mononitrate (IMDUR) 120 MG 24 hr tablet Take 1 tablet (120 mg total) by mouth daily. 30 tablet 2  . lanthanum (FOSRENOL) 1000 MG chewable tablet Chew 1 tablet (1,000 mg total) by mouth 3 (three) times daily with meals. (Patient taking differently: Chew 1,000 mg by mouth 3 (three) times daily after meals. ) 90 tablet 1  . loratadine (CLARITIN) 10 MG tablet Take 10 mg by mouth daily as needed for allergies.    . metoprolol tartrate (LOPRESSOR) 25 MG tablet Take 12.5 mg  by mouth 2 (two) times daily.    . multivitamin (RENA-VIT) TABS tablet Take 1 tablet by mouth daily.    . nitroGLYCERIN (NITROSTAT) 0.4 MG SL tablet Place 1 tablet (0.4 mg total) under the tongue every 5 (five) minutes as needed for chest pain. 30 tablet 1  . omeprazole (PRILOSEC) 20 MG capsule Take 20 mg by mouth daily.    Marland Kitchen oxyCODONE-acetaminophen (PERCOCET/ROXICET) 5-325 MG tablet Take 1  tablet by mouth every 6 (six) hours as needed for pain.    . pantoprazole (PROTONIX) 40 MG tablet Take 1 tablet (40 mg total) by mouth daily. 30 tablet 1  . SENSIPAR 30 MG tablet Take 30 mg by mouth daily with supper.     . simvastatin (ZOCOR) 20 MG tablet Take 1 tablet (20 mg total) by mouth at bedtime. 90 tablet 3    There are no Patient Instructions on file for this visit. No Follow-up on file.   Liborio Saccente A Chianna Spirito, PA-C

## 2016-10-10 NOTE — Progress Notes (Signed)
Pharmacy Antibiotic Note  Shawna [name] is a 76 y.o. female with ESRD admitted on 10/09/2016 with SOB/PNA.  Pharmacy has been consulted for Vancomycin and Cefepime  Dosing.  Vancomycin 1 g IV given in ED at 0200  Plan: Vancomycin 500 mg IV after each HD Cefepime 1 g IV q24h  Height: 5\' 2"  (157.5 cm) Weight: 129 lb (58.5 kg) IBW/kg (Calculated) : 50.1  Temp (24hrs), Avg:98.5 F (36.9 C), Min:98.5 F (36.9 C), Max:98.5 F (36.9 C)   Recent Labs Lab 10/09/16 2325 10/09/16 2337  WBC 12.0*  --   CREATININE 9.96*  --   LATICACIDVEN  --  2.00*    Estimated Creatinine Clearance: 3.8 mL/min (A) (by C-G formula based on SCr of 9.96 mg/dL (H)).    Allergies  Allergen Reactions  . Aspirin Other (See Comments)    High Doses Mess up her stomach; "makes my bowels have blood in them". Takes 81 mg EC Aspirin   . Penicillins Other (See Comments)    SYNCOPE? , "makes me real weak when I take it; like I'll pass out"  Has patient had a PCN reaction causing immediate rash, facial/tongue/throat swelling, SOB or lightheadedness with hypotension: no Has patient had a PCN reaction causing severe rash involving mucus membranes or skin necrosis: no Has patient had a PCN reaction that required hospitalization no Has patient had a PCN reaction occurring within the last 10 years: no If all of the above  . Amlodipine Swelling and Other (See Comments)    Leg swelling  . Bactrim [Sulfamethoxazole-Trimethoprim] Rash  . Contrast Media [Iodinated Diagnostic Agents] Itching  . Iron Itching and Other (See Comments)    "they gave me iron in dialysis; had to give me Benadryl cause I had to have the iron" (05/02/2012)  . Nitrofurantoin Hives  . Prilosec [Omeprazole] Other (See Comments)    "back spasms"  . Tylenol [Acetaminophen] Other (See Comments)    Makes her feet on fire per pt  . Dexilant [Dexlansoprazole] Other (See Comments)    Upset stomach  . Levaquin [Levofloxacin In D5w] Rash  . Morphine And  Related Itching    Itching in feet  . Plavix [Clopidogrel Bisulfate] Rash  . Protonix [Pantoprazole Sodium] Rash  . Venofer [Ferric Oxide] Itching    Patient reports using Benadryl prior to doses as [name] 10/10/2016 4:28 AM

## 2016-10-10 NOTE — ED Notes (Signed)
Name of Provider Notified: Harvest Forest, MD  Critical value of 2.2 lactic acid and 9.32 Troponin I

## 2016-10-10 NOTE — Procedures (Signed)
  I was present at this dialysis session, have reviewed the session itself and made  appropriate changes Kelly Splinter MD [place] pager [phone]   10/10/2016, 12:43 PM

## 2016-10-10 NOTE — Progress Notes (Signed)
Pt had BP of 202/83, Dr. Maylene Roes aware. Will continue to monitor pt.

## 2016-10-10 NOTE — ED Notes (Signed)
Called pharmacy and spoke with Jeneen Rinks, [facility] about compatibility and he state they all could be ran together

## 2016-10-10 NOTE — H&P (Signed)
History and Physical    Shawna [name]:[id number] DOB: 31-Oct-1940 DOA: 10/09/2016  Referring MD/NP/PA: Dr. Laverta Baltimore PCP: Shawna Percy, MD  Patient coming from: Home  Chief Complaint: Chest pain  HPI: Shawna [name] is a 76 y.o. female with medical history significant of ESRD on HD(M/W/F), CHF, CAD s/p CABG, NSTEMI, HTN, GI bleed from AVM; who presents with complaints of complaints of chest pain last night around 9:30 PM.  Associated symptoms included acute onset of shortness of breath. Patient reportedly tried taking 3 nitroglycerin without relief of symptoms. Her last dialysis session was on Friday and she felt like she had extra fluid still on her at that time.    ED Course:  Upon admission into the emergency department patient was seen have significant signs of pulmonary edema and was placed on BiPAP with some improvement of respiratory distress symptoms. Dr. Justin Mend of Nephrology was consulted and patient to receive dialysis. She was placed on a nitroglycerin drip for elevated blood pressures.  Review of Systems: As per HPI otherwise 10 point review of systems negative.   Past Medical History:  Diagnosis Date  . Anxiety   . Arthritis   . AVM (arteriovenous malformation) of colon   . CAD (coronary artery disease)    a. 12/2011 NSTEMI/Cath/PCI LCX (2.25x14 Resolute DES) & D1 (2.25x22 Resolute DES);  b. 01/2012 Cath/PCI: LM 30, LAD 30p, 40-48m, D1 stent ok, 99 in sm branch of diag, LCX patent stent, OM1 20, RCA 95 ost (4.0x12 Promus DES), EF 55%;  c. 04/2012 Lexi Cardiolite  EF 48%, small area of scar @ base/mid inflat wall with mild peri-infarct ischemia.; CABG 12/4  . Carotid artery disease ([place])    a. 18-56% LICA, 09/1495   . Chronic bronchitis ([place])   . Chronic diastolic CHF (congestive heart failure) ([place])    a. 02/2012 Echo EF 60-65%, nl wall motion, Gr 1 DD, mod MR  . Colon cancer ([place]) 1992  . Esophageal stricture   . ESRD on hemodialysis ([place])    ESRD due to HTN, started dialysis 2011  and gets HD at [facility] with Dr Shawna [name] on MWF schedule.  Access is LUA AVF as of Sept 2014.   Marland Kitchen GERD (gastroesophageal reflux disease)   . High cholesterol 12/2011  . History of blood transfusion 07/2011; 12/2011; 01/2012 X 2; 04/2012  . History of gout   . History of lower GI bleeding   . Hypertension   . Iron deficiency anemia   . Mitral regurgitation    a. Moderate by echo, 02/2012  . Myocardial infarction   . Ovarian cancer ([place]) 1992  . Pneumonia ~ 2009  . PUD (peptic ulcer disease)   . TIA (transient ischemic attack)     Past Surgical History:  Procedure Laterality Date  . ABDOMINAL HYSTERECTOMY  1992  . APPENDECTOMY  06/1990  . AV FISTULA PLACEMENT  07/2009   left upper arm  . AV FISTULA PLACEMENT Right 09/06/2016   Procedure: RIGHT FOREARM ARTERIOVENOUS (AV) GRAFT;  Surgeon: Elam Dutch, MD;  Location: [place];  Service: Vascular;  Laterality: Right;  . [place] REMOVAL Right 09/06/2016   Procedure: REMOVAL OF Right Arm ARTERIOVENOUS GORETEX GRAFT and Vein Patch angioplasty of brachial artery;  Surgeon: Angelia Mould, MD;  Location: [place];  Service: Vascular;  Laterality: Right;  . COLON RESECTION  1992  . CORONARY ANGIOPLASTY WITH STENT PLACEMENT  12/15/11   "2"  . CORONARY ANGIOPLASTY WITH STENT PLACEMENT  y/2013   "  1; makes total of 3" (05/02/2012)  . CORONARY ARTERY BYPASS GRAFT  06/13/2012   Procedure: CORONARY ARTERY BYPASS GRAFTING (CABG);  Surgeon: Grace Isaac, MD;  Location: [place];  Service: Open Heart Surgery;  Laterality: N/A;  cabg x four;  using left internal mammary artery, and left leg greater saphenous vein harvested endoscopically  . DILATION AND CURETTAGE OF UTERUS    . ESOPHAGOGASTRODUODENOSCOPY  01/20/2012   Procedure: ESOPHAGOGASTRODUODENOSCOPY (EGD);  Surgeon: Ladene Artist, MD,FACG;  Location[facility] ENDOSCOPY;  Service: Endoscopy;  Laterality: N/A;  . ESOPHAGOGASTRODUODENOSCOPY N/A 03/26/2013   Procedure: ESOPHAGOGASTRODUODENOSCOPY (EGD);   Surgeon: Irene Shipper, MD;  Location: [facility] ENDOSCOPY;  Service: Endoscopy;  Laterality: N/A;  . ESOPHAGOGASTRODUODENOSCOPY N/A 04/30/2015   Procedure: ESOPHAGOGASTRODUODENOSCOPY (EGD);  Surgeon: Rogene Houston, MD;  [facility];  Service: Endoscopy;  Laterality: N/A;  1pm - moved to 10/20 @ 1:10  . ESOPHAGOGASTRODUODENOSCOPY N/A 07/29/2016   Procedure: ESOPHAGOGASTRODUODENOSCOPY (EGD);  Surgeon: Manus Gunning, MD;  Location: [place];  Service: Gastroenterology;  Laterality: N/A;  enteroscopy  . INTRAOPERATIVE TRANSESOPHAGEAL ECHOCARDIOGRAM  06/13/2012   Procedure: INTRAOPERATIVE TRANSESOPHAGEAL ECHOCARDIOGRAM;  Surgeon: Grace Isaac, MD;  Location: [place];  Service: Open Heart Surgery;  Laterality: N/A;  . IR GENERIC HISTORICAL  07/26/2016   IR FLUORO GUIDE CV LINE RIGHT 07/26/2016 Greggory Keen, MD [facility]  . IR GENERIC HISTORICAL  07/26/2016   IR US GUIDE VASC ACCESS RIGHT 07/26/2016 Greggory Keen, MD [facility]  . IR GENERIC HISTORICAL  08/02/2016   IR US GUIDE VASC ACCESS RIGHT 08/02/2016 Greggory Keen, MD [facility]  . IR GENERIC HISTORICAL  08/02/2016   IR FLUORO GUIDE CV LINE RIGHT 08/02/2016 Greggory Keen, MD [facility]  . LEFT HEART CATH AND CORONARY ANGIOGRAPHY N/A 09/20/2016   Procedure: Left Heart Cath and Coronary Angiography;  Surgeon: [name], MD;  Location: [place] CV LAB;  Service: Cardiovascular;  Laterality: N/A;  . LEFT HEART CATHETERIZATION WITH CORONARY ANGIOGRAM N/A 12/15/2011   Procedure: LEFT HEART CATHETERIZATION WITH CORONARY ANGIOGRAM;  Surgeon: Burnell Blanks, MD;  Location: [facility] CATH LAB;  Service: Cardiovascular;  Laterality: N/A;  . LEFT HEART CATHETERIZATION WITH CORONARY ANGIOGRAM N/A 01/10/2012   Procedure: LEFT HEART CATHETERIZATION WITH CORONARY ANGIOGRAM;  Surgeon: Peter M Martinique, MD;  Location: [facility] CATH LAB;  Service: Cardiovascular;  Laterality: N/A;  . LEFT HEART CATHETERIZATION WITH CORONARY ANGIOGRAM N/A  06/08/2012   Procedure: LEFT HEART CATHETERIZATION WITH CORONARY ANGIOGRAM;  Surgeon: Burnell Blanks, MD;  Location: [facility] CATH LAB;  Service: Cardiovascular;  Laterality: N/A;  . LEFT HEART CATHETERIZATION WITH CORONARY/GRAFT ANGIOGRAM N/A 12/10/2013   Procedure: LEFT HEART CATHETERIZATION WITH Beatrix Fetters;  Surgeon: [name], MD;  Location: [facility] CATH LAB;  Service: Cardiovascular;  Laterality: N/A;  . OVARY SURGERY     ovarian cancer  . SHUNTOGRAM N/A 10/15/2013   Procedure: Fistulogram;  Surgeon: Serafina Mitchell, MD;  Location: [facility] CATH LAB;  Service: Cardiovascular;  Laterality: N/A;  . THROMBECTOMY / ARTERIOVENOUS GRAFT REVISION  2011   left upper arm  . TUBAL LIGATION  1980's     reports that she has never smoked. She has never used smokeless tobacco. She reports that she does not drink alcohol or use drugs.  Allergies  Allergen Reactions  . Aspirin Other (See Comments)    High Doses Mess up her stomach; "makes my bowels have blood in them". Takes 81 mg EC Aspirin   . Penicillins Other (  See Comments)    SYNCOPE? , "makes me real weak when I take it; like I'll pass out"  Has patient had a PCN reaction causing immediate rash, facial/tongue/throat swelling, SOB or lightheadedness with hypotension: no Has patient had a PCN reaction causing severe rash involving mucus membranes or skin necrosis: no Has patient had a PCN reaction that required hospitalization no Has patient had a PCN reaction occurring within the last 10 years: no If all of the above  . Amlodipine Swelling and Other (See Comments)    Leg swelling  . Bactrim [Sulfamethoxazole-Trimethoprim] Rash  . Contrast Media [Iodinated Diagnostic Agents] Itching  . Iron Itching and Other (See Comments)    "they gave me iron in dialysis; had to give me Benadryl cause I had to have the iron" (05/02/2012)  . Nitrofurantoin Hives  . Prilosec [Omeprazole] Other (See Comments)    "back spasms"  . Tylenol  [Acetaminophen] Other (See Comments)    Makes her feet on fire per pt  . Dexilant [Dexlansoprazole] Other (See Comments)    Upset stomach  . Levaquin [Levofloxacin In D5w] Rash  . Morphine And Related Itching    Itching in feet  . Plavix [Clopidogrel Bisulfate] Rash  . Protonix [Pantoprazole Sodium] Rash  . Venofer [Ferric Oxide] Itching    Patient reports using Benadryl prior to doses as [place]    Family History  Problem Relation Age of Onset  . Heart disease Mother     Heart Disease before age 98  . Hyperlipidemia Mother   . Hypertension Mother   . Diabetes Mother   . Heart attack Mother   . Heart disease Father     Heart Disease before age 36  . Hyperlipidemia Father   . Hypertension Father   . Diabetes Father   . Diabetes Sister   . Hypertension Sister   . Diabetes Brother   . Hyperlipidemia Brother   . Heart attack Brother   . Hypertension Sister   . Heart attack Brother   . Other      noncontributory for early CAD  . Colon cancer Neg Hx   . Esophageal cancer Neg Hx   . Liver disease Neg Hx   . Kidney disease Neg Hx   . Colon polyps Neg Hx     Prior to Admission medications   Medication Sig Start Date End Date Taking? Authorizing Provider  ALPRAZolam (XANAX) 0.25 MG tablet Take 0.25 mg by mouth 2 (two) times daily as needed for anxiety.  11/19/15  Yes Historical Provider, MD  amiodarone (PACERONE) 200 MG tablet Take 1 tablet (200 mg total) by mouth daily. 08/19/16  Yes Herminio Commons, MD  amLODipine (NORVASC) 2.5 MG tablet Take 2.5 mg by mouth daily.   Yes Historical Provider, MD  aspirin EC 81 MG tablet Take 81 mg by mouth daily.    Yes Historical Provider, MD  atorvastatin (LIPITOR) 40 MG tablet Take 1 tablet (40 mg total) by mouth daily at 6 PM. Patient taking differently: Take 40 mg by mouth daily.  09/22/16  Yes Tiffany Carlota Raspberry, PA-C  carvedilol (COREG) 3.125 MG tablet Take 1 tablet (3.125 mg total) by mouth 2 (two) times daily with a meal. 09/22/16   Yes Delos Haring, PA-C  cloNIDine (CATAPRES) 0.2 MG tablet Take 1 tablet (0.2 mg total) by mouth 2 (two) times daily. 09/22/16  Yes Tiffany Carlota Raspberry, PA-C  epoetin alfa (EPOGEN,PROCRIT) 51761 UNIT/ML injection Inject 1 mL (10,000 Units total) into the vein every Monday,  Wednesday, and Friday with hemodialysis. 04/11/16  Yes Orvan Falconer, MD  fluticasone (FLONASE) 50 MCG/ACT nasal spray Place 2 sprays into the nose daily as needed for allergies.    Yes Historical Provider, MD  hydrALAZINE (APRESOLINE) 50 MG tablet Take 1.5 tablets (75 mg total) by mouth 3 (three) times daily. 09/28/16  Yes Herminio Commons, MD  isosorbide mononitrate (IMDUR) 120 MG 24 hr tablet Take 1 tablet (120 mg total) by mouth daily. 09/22/16  Yes Tiffany Carlota Raspberry, PA-C  lanthanum (FOSRENOL) 1000 MG chewable tablet Chew 1 tablet (1,000 mg total) by mouth 3 (three) times daily with meals. Patient taking differently: Chew 1,000 mg by mouth 3 (three) times daily after meals.  04/08/16  Yes Orvan Falconer, MD  loratadine (CLARITIN) 10 MG tablet Take 10 mg by mouth daily as needed for allergies.   Yes Historical Provider, MD  metoprolol tartrate (LOPRESSOR) 25 MG tablet Take 12.5 mg by mouth 2 (two) times daily. 08/03/16  Yes Historical Provider, MD  multivitamin (RENA-VIT) TABS tablet Take 1 tablet by mouth daily.   Yes Historical Provider, MD  nitroGLYCERIN (NITROSTAT) 0.4 MG SL tablet Place 1 tablet (0.4 mg total) under the tongue every 5 (five) minutes as needed for chest pain. 08/02/16  Yes Kinnie Feil, MD  omeprazole (PRILOSEC) 20 MG capsule Take 20 mg by mouth daily.   Yes Historical Provider, MD  oxyCODONE-acetaminophen (PERCOCET/ROXICET) 5-325 MG tablet Take 1 tablet by mouth every 6 (six) hours as needed for pain. 09/06/16  Yes Historical Provider, MD  pantoprazole (PROTONIX) 40 MG tablet Take 1 tablet (40 mg total) by mouth daily. 08/03/16  Yes Kinnie Feil, MD  SENSIPAR 30 MG tablet Take 30 mg by mouth daily with supper.  12/21/12   Yes Historical Provider, MD  simvastatin (ZOCOR) 20 MG tablet Take 1 tablet (20 mg total) by mouth at bedtime. 04/04/16  Yes Lendon Colonel, NP    Physical Exam:   Constitutional: Moderate respiratory distress with decreased overall aeration appreciated Vitals:   10/10/16 0015 10/10/16 0030 10/10/16 0050 10/10/16 0116  BP: (!) 215/134 (!) 201/132 (!) 188/168   Pulse: (!) 118 (!) 115 (!) 133   Resp: 20 (!) 28 (!) 28   Temp:      TempSrc:      SpO2: 100% 99% 97% 98%  Weight:      Height:       Eyes: PERRL, lids and conjunctivae normal ENMT: Mucous membranes are moist. Posterior pharynx clear of any exudate or lesions.  Neck: normal, supple, no masses, no thyromegaly Respiratory: Tachypneic with accessory muscle usage. Decreased overall aeration with crackles appreciated. Cardiovascular: Tachycardic no significant lower extremity edema noted. Abdomen: no tenderness, no masses palpated. No hepatosplenomegaly. Bowel sounds positive.  Musculoskeletal: no clubbing / cyanosis. No joint deformity upper and lower extremities. Good ROM, no contractures. Normal muscle tone.  Skin: no rashes, lesions, ulcers. No induration Neurologic: CN 2-12 grossly intact. Sensation intact, DTR normal. Strength 5/5 in all 4.  Psychiatric: Normal judgment and insight. Alert and oriented x 3. Anxious mood.     Labs on Admission: I have personally reviewed following labs and imaging studies  CBC:  Recent Labs Lab 10/09/16 2325  WBC 12.0*  NEUTROABS 9.0*  HGB 11.7*  HCT 37.0  MCV 96.9  PLT 865   Basic Metabolic Panel:  Recent Labs Lab 10/09/16 2325  NA 140  K 4.8  CL 96*  CO2 24  GLUCOSE 139*  BUN 54*  CREATININE  9.96*  CALCIUM 9.7   GFR: Estimated Creatinine Clearance: 3.8 mL/min (A) (by C-G formula based on SCr of 9.96 mg/dL (H)). Liver Function Tests:  Recent Labs Lab 10/03/16 0142 10/09/16 2325  AST 29 28  ALT 27 29  ALKPHOS 85 97  BILITOT 0.3 0.7  PROT 6.2* 7.2    ALBUMIN 3.4* 4.2    Recent Labs Lab 10/03/16 0142  LIPASE 17   No results for input(s): AMMONIA in the last 168 hours. Coagulation Profile:  Recent Labs Lab 10/09/16 2325  INR 0.91   Cardiac Enzymes: No results for input(s): CKTOTAL, CKMB, CKMBINDEX, TROPONINI in the last 168 hours. BNP (last 3 results) No results for input(s): PROBNP in the last 8760 hours. HbA1C: No results for input(s): HGBA1C in the last 72 hours. CBG: No results for input(s): GLUCAP in the last 168 hours. Lipid Profile: No results for input(s): CHOL, HDL, LDLCALC, TRIG, CHOLHDL, LDLDIRECT in the last 72 hours. Thyroid Function Tests: No results for input(s): TSH, T4TOTAL, FREET4, T3FREE, THYROIDAB in the last 72 hours. Anemia Panel: No results for input(s): VITAMINB12, FOLATE, FERRITIN, TIBC, IRON, RETICCTPCT in the last 72 hours. Urine analysis:    Component Value Date/Time   COLORURINE YELLOW 12/16/2012 1919   APPEARANCEUR CLOUDY (A) 12/16/2012 1919   LABSPEC 1.009 12/16/2012 1919   PHURINE 7.5 12/16/2012 1919   GLUCOSEU NEGATIVE 12/16/2012 1919   HGBUR TRACE (A) 12/16/2012 1919   BILIRUBINUR NEGATIVE 12/16/2012 1919   KETONESUR NEGATIVE 12/16/2012 1919   PROTEINUR 100 (A) 12/16/2012 1919   UROBILINOGEN 0.2 12/16/2012 1919   NITRITE NEGATIVE 12/16/2012 1919   LEUKOCYTESUR SMALL (A) 12/16/2012 1919   Sepsis Labs: No results found for this or any previous visit (from the past 240 hour(s)).   Radiological Exams on Admission: Dg Chest Portable 1 View  Result Date: 10/09/2016 CLINICAL DATA:  Severe dyspnea, cough and congestion EXAM: PORTABLE CHEST 1 VIEW COMPARISON:  10/03/2016 FINDINGS: Cardiomegaly with aortic atherosclerosis. [place] B-lines consistent with CHF are identified bilaterally. Median sternotomy sutures and right-sided dialysis catheter tips are again visualized without significant change. Superimposed airspace disease about the right hilum and right lung base cannot be entirely  excluded. No acute osseous abnormality. IMPRESSION: Cardiomegaly with aortic atherosclerosis. Interval development of CHF. Superimposed airspace disease/pneumonia at the right lung base and about the right hilum is not entirely excluded. Electronically Signed   By: Ashley Royalty M.D.   On: 10/09/2016 23:56    EKG: Independently reviewed. Sinus tachycardia with  Assessment/Plan Acute respiratory failure with hypoxemia secondary to fluid overload - Admit to stepdown - Continue BiPAP - Dr. Justin Mend of nephrology consulted for the need of urgent dialysis  SIRS: Acute. Patient presents with elevated lactic acid and WBC of 12. - Follow-up blood cultures -Trend lactic acid level   - Continue empiric antibiotics of vancomycin and change Aztreonam  cefepime as patient has tolerated this medication previously in the past  Chest pain: Initial troponin is negative. - Checking cardiac enzymes - Consult cardiology if needed  ESRD on HD - Per nephrology   Hypertensive urgency - Continue nitroglycerin drip, and discontinue when able - Continue clonidine, metoprolol, amlodipine  Diastolic CHF/ CAD s/p CABG -  continued  hydralazine, Coreg, isosorbide mononitrate as tolerated  Hyperlipidemia - Continue atorvastatin   GERD - Continue Protonix  DVT prophylaxis: heparin Code Status: full  Family Communication: Discussed plan of care with the patient and family present at bedside  Disposition Plan: Likely discharge home once medically stable Consults  called: Nephrology Admission status:inpatient  Norval Morton MD [facility] Pager [phone]  If 7PM-7AM, please contact night-coverage [email or web address] Password [email or web address]  10/10/2016, 1:18 AM

## 2016-10-10 NOTE — Progress Notes (Signed)
Admission note:  Arrival Method: via stretcher from HD Mental Orientation: A&Ox4 Telemetry: box 7, NSR. Verified. CCMD notified. Assessment: see doc flow sheet Skin: warm, dry and intact. IV: L hand SL. Pain: denies Safety Measures: Call bell and telephone within reach. Family at bedside. Fall Prevention Safety Plan: discussed and reviewed with pt, verbalized understanding. Admission Screening: in progress. 6700 Orientation: Patient has been oriented to the unit, staff and to the room.

## 2016-10-10 NOTE — Progress Notes (Signed)
Patient transported to Dialysis

## 2016-10-10 NOTE — ED Notes (Signed)
[place] MD stated pt can stop Nitro drip once she goes to Hemodialysis.

## 2016-10-10 NOTE — Consult Note (Addendum)
The patient has been seen in conjunction with Harlan Stains, [facility]. All aspects of care have been considered and discussed. The patient has been personally interviewed, examined, and all clinical data has been reviewed.   Significant elevation in troponin in the setting of chest pain and extreme elevation in blood pressure. Current findings are consistent with non-ST elevation MI, which is a second such event within the past month.  Coronary angiography performed in mid March has been reviewed. Suspect that the diagonal branch is now totally occluded, accounting for this particular presentation. We must exclude new obstructive disease developing due to plaque rupture.  Plan to track cardiac markers and ECG's.  Echocardiogram to reassess LV function and if significant regional wall motion abnormality, may need repeat coronary angiography.   Interventional approach to the patient's coronary disease will be challenged by recurrent GI bleeding and prevents the use of dual antiplatelet therapy.  Cardiology Consult    Patient ID: MISCHELLE REEG MRN: [id number], DOB/AGE: [birth year]   Admit date: 10/09/2016 Date of Consult: 10/10/2016  Primary Physician: [name], MD Primary Cardiologist: Bronson Ing Requesting Provider: Tamala Julian Reason for Consultation: Elevated Trop, Chest pain  Patient Profile    76 yo female with PMH of CAD s/p CABG with recent NSTEMI, HTN, GI bleed from AVM and ESRD on HD who presented with chest pain and elevated troponin.   Past Medical History   Past Medical History:  Diagnosis Date  . Anxiety   . Arthritis   . AVM (arteriovenous malformation) of colon   . CAD (coronary artery disease)    a. 12/2011 NSTEMI/Cath/PCI LCX (2.25x14 Resolute DES) & D1 (2.25x22 Resolute DES);  b. 01/2012 Cath/PCI: LM 30, LAD 30p, 40-48m, D1 stent ok, 99 in sm branch of diag, LCX patent stent, OM1 20, RCA 95 ost (4.0x12 Promus DES), EF 55%;  c. 04/2012 Lexi Cardiolite  EF 48%, small area of  scar @ base/mid inflat wall with mild peri-infarct ischemia.; CABG 12/4  . Carotid artery disease ([place])    a. 09-38% LICA, 07/8297   . Chronic bronchitis ([place])   . Chronic diastolic CHF (congestive heart failure) ([place])    a. 02/2012 Echo EF 60-65%, nl wall motion, Gr 1 DD, mod MR  . Colon cancer ([place]) 1992  . Esophageal stricture   . ESRD on hemodialysis ([place])    ESRD due to HTN, started dialysis 2011 and gets HD at [facility] with Dr Hinda Lenis on MWF schedule.  Access is LUA AVF as of Sept 2014.   Marland Kitchen GERD (gastroesophageal reflux disease)   . High cholesterol 12/2011  . History of blood transfusion 07/2011; 12/2011; 01/2012 X 2; 04/2012  . History of gout   . History of lower GI bleeding   . Hypertension   . Iron deficiency anemia   . Mitral regurgitation    a. Moderate by echo, 02/2012  . Myocardial infarction   . Ovarian cancer ([place]) 1992  . Pneumonia ~ 2009  . PUD (peptic ulcer disease)   . TIA (transient ischemic attack)     Past Surgical History:  Procedure Laterality Date  . ABDOMINAL HYSTERECTOMY  1992  . APPENDECTOMY  06/1990  . AV FISTULA PLACEMENT  07/2009   left upper arm  . AV FISTULA PLACEMENT Right 09/06/2016   Procedure: RIGHT FOREARM ARTERIOVENOUS (AV) GRAFT;  Surgeon: Elam Dutch, MD;  Location: [place];  Service: Vascular;  Laterality: Right;  . [place] REMOVAL Right 09/06/2016   Procedure: REMOVAL OF Right Arm ARTERIOVENOUS  GORETEX GRAFT and Vein Patch angioplasty of brachial artery;  Surgeon: Angelia Mould, MD;  Location: [place];  Service: Vascular;  Laterality: Right;  . COLON RESECTION  1992  . CORONARY ANGIOPLASTY WITH STENT PLACEMENT  12/15/11   "2"  . CORONARY ANGIOPLASTY WITH STENT PLACEMENT  y/2013   "1; makes total of 3" (05/02/2012)  . CORONARY ARTERY BYPASS GRAFT  06/13/2012   Procedure: CORONARY ARTERY BYPASS GRAFTING (CABG);  Surgeon: Grace Isaac, MD;  Location: [place];  Service: Open Heart Surgery;  Laterality: N/A;  cabg x four;  using left  internal mammary artery, and left leg greater saphenous vein harvested endoscopically  . DILATION AND CURETTAGE OF UTERUS    . ESOPHAGOGASTRODUODENOSCOPY  01/20/2012   Procedure: ESOPHAGOGASTRODUODENOSCOPY (EGD);  Surgeon: Ladene Artist, MD,FACG;  Location: [facility] ENDOSCOPY;  Service: Endoscopy;  Laterality: N/A;  . ESOPHAGOGASTRODUODENOSCOPY N/A 03/26/2013   Procedure: ESOPHAGOGASTRODUODENOSCOPY (EGD);  Surgeon: Irene Shipper, MD;  Location: [facility] ENDOSCOPY;  Service: Endoscopy;  Laterality: N/A;  . ESOPHAGOGASTRODUODENOSCOPY N/A 04/30/2015   Procedure: ESOPHAGOGASTRODUODENOSCOPY (EGD);  Surgeon: Rogene [place], MD;  [facility];  Service: Endoscopy;  Laterality: N/A;  1pm - moved to 10/20 @ 1:10  . ESOPHAGOGASTRODUODENOSCOPY N/A 07/29/2016   Procedure: ESOPHAGOGASTRODUODENOSCOPY (EGD);  Surgeon: Manus Gunning, MD;  Location: [place];  Service: Gastroenterology;  Laterality: N/A;  enteroscopy  . INTRAOPERATIVE TRANSESOPHAGEAL ECHOCARDIOGRAM  06/13/2012   Procedure: INTRAOPERATIVE TRANSESOPHAGEAL ECHOCARDIOGRAM;  Surgeon: Grace Isaac, MD;  Location: [place];  Service: Open Heart Surgery;  Laterality: N/A;  . IR GENERIC HISTORICAL  07/26/2016   IR FLUORO GUIDE CV LINE RIGHT 07/26/2016 Greggory Keen, MD [facility]  . IR GENERIC HISTORICAL  07/26/2016   IR US GUIDE VASC ACCESS RIGHT 07/26/2016 Greggory Keen, MD [facility]  . IR GENERIC HISTORICAL  08/02/2016   IR US GUIDE VASC ACCESS RIGHT 08/02/2016 Greggory Keen, MD [facility]  . IR GENERIC HISTORICAL  08/02/2016   IR FLUORO GUIDE CV LINE RIGHT 08/02/2016 Greggory Keen, MD [facility]  . LEFT HEART CATH AND CORONARY ANGIOGRAPHY N/A 09/20/2016   Procedure: Left Heart Cath and Coronary Angiography;  Surgeon: [name], MD;  Location: [place] CV LAB;  Service: Cardiovascular;  Laterality: N/A;  . LEFT HEART CATHETERIZATION WITH CORONARY ANGIOGRAM N/A 12/15/2011   Procedure: LEFT HEART CATHETERIZATION WITH CORONARY  ANGIOGRAM;  Surgeon: Burnell Blanks, MD;  Location[facility] CATH LAB;  Service: Cardiovascular;  Laterality: N/A;  . LEFT HEART CATHETERIZATION WITH CORONARY ANGIOGRAM N/A 01/10/2012   Procedure: LEFT HEART CATHETERIZATION WITH CORONARY ANGIOGRAM;  Surgeon: Peter M Martinique, MD;  Location: [facility] CATH LAB;  Service: Cardiovascular;  Laterality: N/A;  . LEFT HEART CATHETERIZATION WITH CORONARY ANGIOGRAM N/A 06/08/2012   Procedure: LEFT HEART CATHETERIZATION WITH CORONARY ANGIOGRAM;  Surgeon: Burnell Blanks, MD;  Location: [facility] CATH LAB;  Service: Cardiovascular;  Laterality: N/A;  . LEFT HEART CATHETERIZATION WITH CORONARY/GRAFT ANGIOGRAM N/A 12/10/2013   Procedure: LEFT HEART CATHETERIZATION WITH Beatrix Fetters;  Surgeon: [name], MD;  Location: [facility] CATH LAB;  Service: Cardiovascular;  Laterality: N/A;  . OVARY SURGERY     ovarian cancer  . SHUNTOGRAM N/A 10/15/2013   Procedure: Fistulogram;  Surgeon: Serafina Mitchell, MD;  Location: [facility] CATH LAB;  Service: Cardiovascular;  Laterality: N/A;  . THROMBECTOMY / ARTERIOVENOUS GRAFT REVISION  2011   left upper arm  . TUBAL LIGATION  1980's     Allergies  Allergies  Allergen Reactions  .  Aspirin Other (See Comments)    High Doses Mess up her stomach; "makes my bowels have blood in them". Takes 81 mg EC Aspirin   . Penicillins Other (See Comments)    SYNCOPE? , "makes me real weak when I take it; like I'll pass out"  Has patient had a PCN reaction causing immediate rash, facial/tongue/throat swelling, SOB or lightheadedness with hypotension: no Has patient had a PCN reaction causing severe rash involving mucus membranes or skin necrosis: no Has patient had a PCN reaction that required hospitalization no Has patient had a PCN reaction occurring within the last 10 years: no If all of the above  . Amlodipine Swelling and Other (See Comments)    Leg swelling  . Bactrim [Sulfamethoxazole-Trimethoprim] Rash  . Contrast Media  [Iodinated Diagnostic Agents] Itching  . Iron Itching and Other (See Comments)    "they gave me iron in dialysis; had to give me Benadryl cause I had to have the iron" (05/02/2012)  . Nitrofurantoin Hives  . Prilosec [Omeprazole] Other (See Comments)    "back spasms"  . Tylenol [Acetaminophen] Other (See Comments)    Makes her feet on fire per pt  . Dexilant [Dexlansoprazole] Other (See Comments)    Upset stomach  . Levaquin [Levofloxacin In D5w] Rash  . Morphine And Related Itching    Itching in feet  . Plavix [Clopidogrel Bisulfate] Rash  . Protonix [Pantoprazole Sodium] Rash  . Venofer [Ferric Oxide] Itching    Patient reports using Benadryl prior to doses as [place]    History of Present Illness    [name] is a 76 yo female with PMH of CAD s/p CABG with recent NSTEMI, HTN, GI bleed from AVM and ESRD on HD. She was recently admitted 09/20/16 and underwent LHC with Dr. Tamala Julian for an NSTEMI which showed high-grade in-stent restenosis of the 2.25 mm drug-eluting stent in the proximal to mid diagonal. The lesion extends from the proximal third of the stent into the proximal and ostial segment of the native vessel. The diagonal is moderate in size. It was felt that restenting this segment would be associated with very high restenosis rate given the small size. The best treatment option is medical therapy. Cath also showed widely patent LIMA to the LAD and SVGs to the distal RCA, distal Lcx and RM. She was continued on medical therapy at the time of discharge. Felt well after discharge and followed up in the office with Dr. Bronson Ing. Blood pressure at her office visit with still elevated and her hydralazine was titrated to 75mg  TID, with clonidine 0.2mg  BID and Coreg 3.215mg  BID continued. Planned for follow up in 3 months.   She was in her usual state of health until last evening when she developed a sudden onset of chest pain, dyspnea and upper back pain. Reports she had her usual  scheduled dialysis on Friday, but felt that they did not remove enough fluid as her weight stayed the same.   In the ED her labs showed stable electrolytes, Cr 9.96, POC trop 0.06, follow up trop 9.32, WBC 12.0, lactic acid 2.0. Chest x-ray showed cardiomegaly with pneumonia in the right lung base. Started on antibiotics. EKG showed ST with IVCD, and diffuse ST depression which was felt to be rate related. She required Bipap while in the ED, and nephrology was consulted. Also noted to be hypertensive with systolic in the 737V on admission. Was started on a nitro drip.   Inpatient Medications    .  amiodarone  200 mg Oral Daily  . amLODipine  2.5 mg Oral Daily  . aspirin EC  81 mg Oral Daily  . atorvastatin  40 mg Oral q1800  . carvedilol  3.125 mg Oral BID WC  . [START ON 10/11/2016] ceFEPime (MAXIPIME) IV  1 g Intravenous Q24H  . ceFEPime (MAXIPIME) IV  2 g Intravenous Once  . cinacalcet  30 mg Oral Q supper  . cloNIDine  0.2 mg Oral BID  . doxercalciferol  7 mcg Intravenous Q M,W,F-HD  . heparin  5,000 Units Subcutaneous Q8H  . hydrALAZINE  75 mg Oral Q8H  . isosorbide mononitrate  120 mg Oral Daily  . lanthanum  1,000 mg Oral TID PC  . multivitamin  1 tablet Oral QHS  . pantoprazole  40 mg Oral Daily  . sodium chloride flush  3 mL Intravenous Q12H  . [START ON 10/12/2016] vancomycin  500 mg Intravenous Q M,W,F-HD    Family History    Family History  Problem Relation Age of Onset  . Heart disease Mother     Heart Disease before age 41  . Hyperlipidemia Mother   . Hypertension Mother   . Diabetes Mother   . Heart attack Mother   . Heart disease Father     Heart Disease before age 68  . Hyperlipidemia Father   . Hypertension Father   . Diabetes Father   . Diabetes Sister   . Hypertension Sister   . Diabetes Brother   . Hyperlipidemia Brother   . Heart attack Brother   . Hypertension Sister   . Heart attack Brother   . Other      noncontributory for early CAD  . Colon  cancer Neg Hx   . Esophageal cancer Neg Hx   . Liver disease Neg Hx   . Kidney disease Neg Hx   . Colon polyps Neg Hx     Social History    Social History   Social History  . Marital status: Married    Spouse name: N/A  . Number of children: N/A  . Years of education: N/A   Occupational History  . Not on file.   Social History Main Topics  . Smoking status: Never Smoker  . Smokeless tobacco: Never Used  . Alcohol use No  . Drug use: No  . Sexual activity: Yes    Birth control/ protection: Surgical   Other Topics Concern  . Not on file   Social History Narrative   Lives in [place], [place] with husband.  Dialysis pt - [facility].     Review of Systems    General:  No chills, fever, night sweats or weight changes.  Cardiovascular: See HPI Dermatological: No rash, lesions/masses Respiratory: No cough, dyspnea Urologic: No hematuria, dysuria Abdominal:   No nausea, vomiting, diarrhea, bright red blood per rectum, melena, or hematemesis Neurologic:  No visual changes, wkns, changes in mental status. All other systems reviewed and are otherwise negative except as noted above.  Physical Exam    Blood pressure (!) 159/75, pulse 65, temperature 97.9 F (36.6 C), temperature source Oral, resp. rate 16, height 5\' 2"  (1.575 m), weight 129 lb (58.5 kg), SpO2 100 %.  General: Pleasant older AAF, NAD, wearing [place]. Psych: Normal affect. Neuro: Alert and oriented X 3. Moves all extremities spontaneously. HEENT: Normal  Neck: Supple without bruits or JVD. Lungs:  Resp regular and unlabored, CTA. Heart: RRR no s3, s4, or murmurs. Abdomen: Soft, non-tender, non-distended, BS + x  4.  Extremities: No clubbing, cyanosis or edema. DP/PT/Radials 2+ and equal bilaterally. Fistula left arm.   Labs    Troponin [facility] of Care Test)  Recent Labs  10/09/16 2335  TROPIPOC 0.06    Recent Labs  10/10/16 0450  TROPONINI 9.32*   Lab Results  Component Value Date   WBC 12.5 (H) 10/10/2016    HGB 10.7 (L) 10/10/2016   HCT 34.2 (L) 10/10/2016   MCV 97.7 10/10/2016   PLT 199 10/10/2016    Recent Labs Lab 10/09/16 2325 10/10/16 0450  NA 140 140  K 4.8 5.1  CL 96* 97*  CO2 24 26  BUN 54* 59*  CREATININE 9.96* 10.41*  CALCIUM 9.7 9.2  PROT 7.2  --   BILITOT 0.7  --   ALKPHOS 97  --   ALT 29  --   AST 28  --   GLUCOSE 139* 140*   Lab Results  Component Value Date   CHOL 169 09/20/2016   HDL 70 09/20/2016   LDLCALC 84 09/20/2016   TRIG 76 09/20/2016   Lab Results  Component Value Date   DDIMER 1.97 (H) 07/03/2012     Radiology Studies    Dg Chest 2 View  Result Date: 10/03/2016 CLINICAL DATA:  Acute onset of high blood pressure and headache. Initial encounter. EXAM: CHEST  2 VIEW COMPARISON:  Chest radiograph performed 09/19/2016 FINDINGS: The lungs are well-aerated. Mild left basilar atelectasis is again noted. There is no evidence of focal opacification, pleural effusion or pneumothorax. The heart is borderline enlarged. The patient is status post median sternotomy, with evidence of prior CABG. No acute osseous abnormalities are seen. A right-sided dual-lumen catheter is noted ending about the cavoatrial junction. Scattered calcification is seen along the proximal abdominal aorta. IMPRESSION: 1. Mild left basilar atelectasis again noted. Lungs otherwise clear. 2. Borderline cardiomegaly. 3. Scattered aortic atherosclerosis. Electronically Signed   By: Garald Balding M.D.   On: 10/03/2016 00:30   Dg Chest 2 View  Result Date: 09/19/2016 CLINICAL DATA:  Dialysis.  Chest pains. EXAM: CHEST  2 VIEW COMPARISON:  09/07/2016. FINDINGS: Dialysis catheter noted with its tip at the cavoatrial junction. Prior CABG. Cardiomegaly. Near complete clearing of previously identified mild interstitial prominence. Mild bibasilar subsegmental atelectasis. Tiny bilateral pleural effusions. No pneumothorax. IMPRESSION: 1. Dialysis catheter noted is stable position. 2. Cardiomegaly.  Previously identified pulmonary interstitial edema has almost completely cleared. Mild bibasilar subsegmental atelectasis . Electronically Signed   By: Marcello Moores  Register   On: 09/19/2016 12:02   Dg Chest Portable 1 View  Result Date: 10/09/2016 CLINICAL DATA:  Severe dyspnea, cough and congestion EXAM: PORTABLE CHEST 1 VIEW COMPARISON:  10/03/2016 FINDINGS: Cardiomegaly with aortic atherosclerosis. Kerley B-lines consistent with CHF are identified bilaterally. Median sternotomy sutures and right-sided dialysis catheter tips are again visualized without significant change. Superimposed airspace disease about the right hilum and right lung base cannot be entirely excluded. No acute osseous abnormality. IMPRESSION: Cardiomegaly with aortic atherosclerosis. Interval development of CHF. Superimposed airspace disease/pneumonia at the right lung base and about the right hilum is not entirely excluded. Electronically Signed   By: Ashley Royalty M.D.   On: 10/09/2016 23:56   US Abdomen Limited Ruq  Result Date: 10/03/2016 CLINICAL DATA:  Right upper quadrant pain, onset yesterday. EXAM: US ABDOMEN LIMITED - RIGHT UPPER QUADRANT COMPARISON:  Ultrasound 03/22/2016, CT 03/03/2016 FINDINGS: Gallbladder: Physiologically distended. No gallstones. Mild gallbladder wall thickening measuring 3-4 mm. Trace pericholecystic fluid. No sonographic Murphy sign noted  by sonographer. Common bile duct: Diameter: 2 mm. Liver: No focal lesion identified. Within normal limits in parenchymal echogenicity. Normal directional flow in the main portal vein. Incidental note of increased right renal echogenicity and right renal cyst. IMPRESSION: 1. Borderline gallbladder wall thickening with trace pericholecystic fluid, nonspecific. This can be seen in the setting of systemic causes such as liver disease or cardiac failure, or primary gallbladder inflammation. No gallstones or sonographic Murphy sign. 2. No biliary dilatation. Unremarkable  sonographic appearance of the liver. Electronically Signed   By: Jeb Levering M.D.   On: 10/03/2016 04:13    ECG & Cardiac Imaging    EKG: ST with IVCD with diffuse ST depressions, ?rate related.   Echo: 07/28/16  Study Conclusions  - Left ventricle: Septal and apical hypokinesis. The cavity size   was normal. Wall thickness was normal. Systolic function was   normal. The estimated ejection fraction was in the range of 50%   to 55%. Doppler parameters are consistent with both elevated   ventricular end-diastolic filling pressure and elevated left   atrial filling pressure. - Mitral valve: There was mild regurgitation. Valve area by   continuity equation (using LVOT flow): 1.49 cm^2. - Left atrium: The atrium was moderately dilated. - Atrial septum: No defect or patent foramen ovale was identified. - Pulmonary arteries: PA peak pressure: 54 mm Hg (S).  Cath: 09/20/16  Conclusion    Widely patent left internal mammary graft to LAD  Three widely patent saphenous vein grafts including to 1) distal RCA, 2) distal circumflex, and 3) ramus intermedius.  Total occlusion of the ostial RCA due to in-stent restenosis.  80% proximal/ostial ramus intermedius  99% proximal circumflex  60-70% proximal to mid LAD and 80% distal LAD  High-grade in-stent restenosis of the 2.25 mm drug-eluting stent in the proximal to mid diagonal. The lesion extends from the proximal third of the stent into the proximal and ostial segment of the native vessel. Perhaps angina is coming from this territory. The diagonal is moderate in size. Restenting this segment would be associated with very high restenosis rate given the small size. The best treatment option is medical therapy.  Low normal to mildly depressed LV function with EF 40-45%. Mid anterior wall hypokinesis. LVEDP 25 mmHg.  RECOMMENDATIONS:   Aggressive medical therapy of blood pressure.  Repeat PCI on the diagonal was contemplated but  long-term patency is anticipated to be very low given the small caliber of the vessel and diffuse in-stent restenosis. Additionally the patient has had GI bleeding and reported allergies to aspirin and Plavix. For these varied reasons, medical therapy seems to be the most prudent treatment in this situation.    Assessment & Plan    76 yo female with PMH of CAD s/p CABG with recent NSTEMI, HTN, GI bleed from AVM and ESRD on HD who presented with chest pain and elevated troponin.   1. Chest pain: Sudden onset last night with dyspnea. Similar to previous pain with her last cath. This was in the setting of volume overload, and sepsis 2/2 PNA. Last cath noted diffuse disease with patent grafts. Small caliber vessel first diag disease noted, but felt to be high risk for PCI and her allergies. Treated medically. Trop up to 9 this admission in the setting of acute illness and volume overload. EKG showed diffuse ST depressions with elevated rate.  -- currently without chest pain -- continue to cycle trop, may have occluded diag that was 99% on last cath,  but has a SVG that was patent.  -- will check echo to determine if there has been a decreased in LV function -- repeat EKG  2. HTN: Initially hypertensive on admission and required nitro gtt. Blood pressure has since improved.   3. ESRD on HD: Followed by nephrology, had HD today.   4. SIRS 2/2 HCAP: elevated WBC and lactic acid. Antibiotics per primary  Signed, Reino Bellis, NP-C Pager [phone] 10/10/2016, 3:34 PM

## 2016-10-10 NOTE — Progress Notes (Addendum)
PROGRESS NOTE    Shawna [name]  YQI:[id number] DOB: 1941-02-07 DOA: 10/09/2016 PCP: [name], MD     Brief Narrative:  Shawna [name] is a 76 y.o. female with medical history significant of ESRD on HD (M/W/F), CHF, CAD s/p CABG, NSTEMI, HTN, GI bleed from AVM; who presents with complaints of chest pain last night around 9:30 PM.  Associated symptoms included acute onset of shortness of breath. Patient reportedly tried taking 3 nitroglycerin without relief of symptoms. Her last dialysis session was on Friday and she felt like she had extra fluid still on her at that time. Upon admission into the emergency department patient was seen have significant signs of pulmonary edema and was placed on BiPAP with some improvement of respiratory distress symptoms. Dr. Justin Mend of Nephrology was consulted and patient to receive dialysis. She was placed on a nitroglycerin drip for elevated blood pressures.  Assessment & Plan:   Principal Problem:   Acute on chronic respiratory failure with hypoxia (HCC) Active Problems:   ESRD on hemodialysis (HCC)   Chronic diastolic CHF (congestive heart failure) (HCC)   Hypertensive urgency   Fluid overload  Acute respiratory failure with hypoxemia secondary to fluid overload - Now off BiPAP - Improved after hemodialysis  Chest pain CAD s/p CABG with recent NSTEMI - Seen by cardiology last admission s/p heart cath showed high grade stent stenosis of DES. PCI not done due to high rate of restenosis, also patient with hx GI bleed. Imdur increased to 120mg  daily. Need aggressive control of blood pressure  - Trend troponin  - Continue aspirin, lipitor  - Cardiology consulted for further medical recommendation  Sepsis secondary to HCAP  - Tachycardia, tachypnea, leukocytosis 12.5, procalcitonin 0.55 - Blood cultures pending  - Continue vancomycin, cefepime  Hypertensive urgency - Now off nitro drip  - Continue clonidine 0.2mg  BID, coreg 3.125mg  BID, hydralazine  50mg  q8h, amlodipine 2.5mg  daily   ESRD on HD - Per nephrology   Paroxysmal A fib - Continue amiodarone   GERD - Continue Protonix   DVT prophylaxis: heparin subq Code Status: full Family Communication: at bedside Disposition Plan: pending stabilization   Consultants:   Nephrology  Procedures:   None  Antimicrobials:  Anti-infectives    Start     Dose/Rate Route Frequency Ordered Stop   10/12/16 1200  vancomycin (VANCOCIN) 500 mg in sodium chloride 0.9 % 100 mL IVPB     500 mg 100 mL/hr over 60 Minutes Intravenous Every M-W-F (Hemodialysis) 10/10/16 1046     10/11/16 2200  ceFEPIme (MAXIPIME) 1 g in dextrose 5 % 50 mL IVPB     1 g 100 mL/hr over 30 Minutes Intravenous Every 24 hours 10/10/16 1039     10/10/16 1400  ceFEPIme (MAXIPIME) 2 g in dextrose 5 % 50 mL IVPB     2 g 100 mL/hr over 30 Minutes Intravenous  Once 10/10/16 1039     10/10/16 1200  vancomycin (VANCOCIN) 500 mg in sodium chloride 0.9 % 100 mL IVPB  Status:  Discontinued     500 mg 100 mL/hr over 60 Minutes Intravenous Every M-W-F (Hemodialysis) 10/10/16 0431 10/10/16 1046   10/10/16 1200  vancomycin (VANCOCIN) IVPB 750 mg/150 ml premix     750 mg 150 mL/hr over 60 Minutes Intravenous Every M-W-F (Hemodialysis) 10/10/16 1046 10/10/16 1210   10/10/16 1046  Vancomycin (VANCOCIN) 750-5 MG/150ML-% IVPB    Comments:  Kiang, Angelito   : cabinet override      10/10/16  1046 10/10/16 1248   10/10/16 1000  ceFEPIme (MAXIPIME) 1 g in dextrose 5 % 50 mL IVPB  Status:  Discontinued     1 g 100 mL/hr over 30 Minutes Intravenous Every 24 hours 10/10/16 0431 10/10/16 1039   10/10/16 0430  ceFEPIme (MAXIPIME) 2 g in dextrose 5 % 50 mL IVPB  Status:  Discontinued     2 g 100 mL/hr over 30 Minutes Intravenous  Once 10/10/16 0421 10/10/16 0426   10/10/16 0000  aztreonam (AZACTAM) 2 g in dextrose 5 % 50 mL IVPB     2 g 100 mL/hr over 30 Minutes Intravenous  Once 10/09/16 2359 10/10/16 0212   10/10/16 0000   vancomycin (VANCOCIN) IVPB 1000 mg/200 mL premix     1,000 mg 200 mL/hr over 60 Minutes Intravenous  Once 10/09/16 2359 10/10/16 0311       Subjective: Patient states that she had severe anterior chest pain that was indescribable. This radiated to her back as well. She took 3 nitroglycerin with minimal relief. Currently, after dialysis, chest pain is much better but still comes and goes. She required another nitroglycerin during dialysis session.  Objective: Vitals:   10/10/16 1100 10/10/16 1130 10/10/16 1200 10/10/16 1210  BP: (!) 165/80 (!) 162/78 (!) 158/70 (!) 159/75  Pulse: 68 66 67 65  Resp: 16 17 18 16   Temp:    97.9 F (36.6 C)  TempSrc:    Oral  SpO2: 100% 100% 100% 100%  Weight:      Height:        Intake/Output Summary (Last 24 hours) at 10/10/16 1448 Last data filed at 10/10/16 1210  Gross per 24 hour  Intake           423.68 ml  Output             3150 ml  Net         -2726.32 ml   Filed Weights   10/09/16 2316  Weight: 58.5 kg (129 lb)    Examination:  General exam: Appears calm and comfortable  Respiratory system: Crackles in right base, Respiratory effort normal. Cardiovascular system: S1 & S2 heard, RRR. No JVD, murmurs, rubs, gallops or clicks. Trace pedal edema. Gastrointestinal system: Abdomen is nondistended, soft and nontender. No organomegaly or masses felt. Normal bowel sounds heard. Central nervous system: Alert and oriented. No focal neurological deficits. Extremities: Symmetric 5 x 5 power. Skin: No rashes, lesions or ulcers Psychiatry: Judgement and insight appear normal. Mood & affect appropriate.   Data Reviewed: I have personally reviewed following labs and imaging studies  CBC:  Recent Labs Lab 10/09/16 2325 10/10/16 0450  WBC 12.0* 12.5*  NEUTROABS 9.0*  --   HGB 11.7* 10.7*  HCT 37.0 34.2*  MCV 96.9 97.7  PLT 212 237   Basic Metabolic Panel:  Recent Labs Lab 10/09/16 2325 10/10/16 0450  NA 140 140  K 4.8 5.1  CL  96* 97*  CO2 24 26  GLUCOSE 139* 140*  BUN 54* 59*  CREATININE 9.96* 10.41*  CALCIUM 9.7 9.2   GFR: Estimated Creatinine Clearance: 3.6 mL/min (A) (by C-G formula based on SCr of 10.41 mg/dL (H)). Liver Function Tests:  Recent Labs Lab 10/09/16 2325  AST 28  ALT 29  ALKPHOS 97  BILITOT 0.7  PROT 7.2  ALBUMIN 4.2   No results for input(s): LIPASE, AMYLASE in the last 168 hours. No results for input(s): AMMONIA in the last 168 hours. Coagulation Profile:  Recent Labs  Lab 10/09/16 2325  INR 0.91   Cardiac Enzymes:  Recent Labs Lab 10/10/16 0450  TROPONINI 9.32*   BNP (last 3 results) No results for input(s): PROBNP in the last 8760 hours. HbA1C: No results for input(s): HGBA1C in the last 72 hours. CBG: No results for input(s): GLUCAP in the last 168 hours. Lipid Profile: No results for input(s): CHOL, HDL, LDLCALC, TRIG, CHOLHDL, LDLDIRECT in the last 72 hours. Thyroid Function Tests: No results for input(s): TSH, T4TOTAL, FREET4, T3FREE, THYROIDAB in the last 72 hours. Anemia Panel: No results for input(s): VITAMINB12, FOLATE, FERRITIN, TIBC, IRON, RETICCTPCT in the last 72 hours. Sepsis Labs:  Recent Labs Lab 10/09/16 2337 10/10/16 0450 10/10/16 0723  PROCALCITON  --  0.55  --   LATICACIDVEN 2.00* 2.2* 2.1*    Recent Results (from the past 240 hour(s))  Blood Culture (routine x 2)     Status: None (Preliminary result)   Collection Time: 10/10/16 12:44 AM  Result Value Ref Range Status   Specimen Description BLOOD LEFT ARM  Final   Special Requests   Final    BOTTLES DRAWN AEROBIC AND ANAEROBIC Blood Culture adequate volume   Culture PENDING  Incomplete   Report Status PENDING  Incomplete  Blood Culture (routine x 2)     Status: None (Preliminary result)   Collection Time: 10/10/16  1:24 AM  Result Value Ref Range Status   Specimen Description BLOOD RIGHT ARM  Final   Special Requests IN PEDIATRIC BOTTLE Blood Culture adequate volume  Final    Culture PENDING  Incomplete   Report Status PENDING  Incomplete       Radiology Studies: Dg Chest Portable 1 View  Result Date: 10/09/2016 CLINICAL DATA:  Severe dyspnea, cough and congestion EXAM: PORTABLE CHEST 1 VIEW COMPARISON:  10/03/2016 FINDINGS: Cardiomegaly with aortic atherosclerosis. Kerley B-lines consistent with CHF are identified bilaterally. Median sternotomy sutures and right-sided dialysis catheter tips are again visualized without significant change. Superimposed airspace disease about the right hilum and right lung base cannot be entirely excluded. No acute osseous abnormality. IMPRESSION: Cardiomegaly with aortic atherosclerosis. Interval development of CHF. Superimposed airspace disease/pneumonia at the right lung base and about the right hilum is not entirely excluded. Electronically Signed   By: Ashley Royalty M.D.   On: 10/09/2016 23:56      Scheduled Meds: . amiodarone  200 mg Oral Daily  . amLODipine  2.5 mg Oral Daily  . aspirin EC  81 mg Oral Daily  . atorvastatin  40 mg Oral q1800  . carvedilol  3.125 mg Oral BID WC  . [START ON 10/11/2016] ceFEPime (MAXIPIME) IV  1 g Intravenous Q24H  . ceFEPime (MAXIPIME) IV  2 g Intravenous Once  . cinacalcet  30 mg Oral Q supper  . cloNIDine  0.2 mg Oral BID  . doxercalciferol  7 mcg Intravenous Q M,W,F-HD  . heparin  5,000 Units Subcutaneous Q8H  . hydrALAZINE  75 mg Oral Q8H  . isosorbide mononitrate  120 mg Oral Daily  . lanthanum  1,000 mg Oral TID PC  . multivitamin  1 tablet Oral QHS  . pantoprazole  40 mg Oral Daily  . sodium chloride flush  3 mL Intravenous Q12H  . [START ON 10/12/2016] vancomycin  500 mg Intravenous Q M,W,F-HD   Continuous Infusions:   LOS: 0 days    Time spent: 40 minutes   Dessa Phi, DO [facility] [email or web address] Password [facility] 10/10/2016, 2:48 PM

## 2016-10-10 NOTE — Progress Notes (Signed)
PC increase per patient tolerance. RN at bedside

## 2016-10-10 NOTE — Consult Note (Signed)
[facility] Renal Consultation Note    Indication for Consultation:  Management of ESRD/hemodialysis; anemia, hypertension/volume and secondary hyperparathyroidism Referring Physician - Nanda Quinton, MD  HPI: Shawna [name] is a 76 y.o. female with ESRD (MWF - [address]), CAD s/p CABG with recent NSTEMI admission (09/22/16 discharge cath that admission showed high grade instent stenosis of the DES- PCI not done due to high rate of restenosis- imdur was started), hx GIB, CHF, HTN who presented to the ED with SOB that started late yesterday.  She had been able to go to church earlier in the day.  SOB was preceeding with CP that she treated with 3 SL NTG. The CP subsided but she was extremely SOB and subsequently came to the ED.  EKG showed ST, IVCD, ST changes were thought to be rate related but Trop ^^. BP was markedly elevated.  CXR showed pulmonary edema She last dialyzed Friday - we are awaiting dialysis records.  She was given a breathing treatment in the ED and placed on BiPAP and is currently dialyzing with a goal of 4 L.  Hg 10.7 K 5.1, Trop 9.32 up from 0.06 3/12 , WBC 12.5 procalcitonin 0.55, lactic acid 2.2. At present she still has some mild left sided CP nonradiating, coughing mucous at times but no N, V, fever or chills.  Past Medical History:  Diagnosis Date  . Anxiety   . Arthritis   . AVM (arteriovenous malformation) of colon   . CAD (coronary artery disease)    a. 12/2011 NSTEMI/Cath/PCI LCX (2.25x14 Resolute DES) & D1 (2.25x22 Resolute DES);  b. 01/2012 Cath/PCI: LM 30, LAD 30p, 40-23m, D1 stent ok, 99 in sm branch of diag, LCX patent stent, OM1 20, RCA 95 ost (4.0x12 Promus DES), EF 55%;  c. 04/2012 Lexi Cardiolite  EF 48%, small area of scar @ base/mid inflat wall with mild peri-infarct ischemia.; CABG 12/4  . Carotid artery disease ([place])    a. 53-66% LICA, 10/4032   . Chronic bronchitis ([place])   . Chronic diastolic CHF (congestive heart failure) ([place])    a. 02/2012  Echo EF 60-65%, nl wall motion, Gr 1 DD, mod MR  . Colon cancer ([place]) 1992  . Esophageal stricture   . ESRD on hemodialysis ([place])    ESRD due to HTN, started dialysis 2011 and gets HD at [facility] with Dr Hinda Lenis on MWF schedule.  Access is LUA AVF as of Sept 2014.   Marland Kitchen GERD (gastroesophageal reflux disease)   . High cholesterol 12/2011  . History of blood transfusion 07/2011; 12/2011; 01/2012 X 2; 04/2012  . History of gout   . History of lower GI bleeding   . Hypertension   . Iron deficiency anemia   . Mitral regurgitation    a. Moderate by echo, 02/2012  . Myocardial infarction   . Ovarian cancer ([place]) 1992  . Pneumonia ~ 2009  . PUD (peptic ulcer disease)   . TIA (transient ischemic attack)    Past Surgical History:  Procedure Laterality Date  . ABDOMINAL HYSTERECTOMY  1992  . APPENDECTOMY  06/1990  . AV FISTULA PLACEMENT  07/2009   left upper arm  . AV FISTULA PLACEMENT Right 09/06/2016   Procedure: RIGHT FOREARM ARTERIOVENOUS (AV) GRAFT;  Surgeon: Elam Dutch, MD;  Location: [place];  Service: Vascular;  Laterality: Right;  . [place] REMOVAL Right 09/06/2016   Procedure: REMOVAL OF Right Arm ARTERIOVENOUS GORETEX GRAFT and Vein Patch angioplasty of brachial artery;  Surgeon:  Angelia Mould, MD;  Location: [place];  Service: Vascular;  Laterality: Right;  . COLON RESECTION  1992  . CORONARY ANGIOPLASTY WITH STENT PLACEMENT  12/15/11   "2"  . CORONARY ANGIOPLASTY WITH STENT PLACEMENT  y/2013   "1; makes total of 3" (05/02/2012)  . CORONARY ARTERY BYPASS GRAFT  06/13/2012   Procedure: CORONARY ARTERY BYPASS GRAFTING (CABG);  Surgeon: Grace Isaac, MD;  Location: [place];  Service: Open Heart Surgery;  Laterality: N/A;  cabg x four;  using left internal mammary artery, and left leg greater saphenous vein harvested endoscopically  . DILATION AND CURETTAGE OF UTERUS    . ESOPHAGOGASTRODUODENOSCOPY  01/20/2012   Procedure: ESOPHAGOGASTRODUODENOSCOPY (EGD);  Surgeon: Ladene Artist, MD,FACG;  Location: [facility] ENDOSCOPY;  Service: Endoscopy;  Laterality: N/A;  . ESOPHAGOGASTRODUODENOSCOPY N/A 03/26/2013   Procedure: ESOPHAGOGASTRODUODENOSCOPY (EGD);  Surgeon: Irene Shipper, MD;  Location: [facility] ENDOSCOPY;  Service: Endoscopy;  Laterality: N/A;  . ESOPHAGOGASTRODUODENOSCOPY N/A 04/30/2015   Procedure: ESOPHAGOGASTRODUODENOSCOPY (EGD);  Surgeon: Rogene Houston, MD;  [facility];  Service: Endoscopy;  Laterality: N/A;  1pm - moved to 10/20 @ 1:10  . ESOPHAGOGASTRODUODENOSCOPY N/A 07/29/2016   Procedure: ESOPHAGOGASTRODUODENOSCOPY (EGD);  Surgeon: Manus Gunning, MD;  Location: [place];  Service: Gastroenterology;  Laterality: N/A;  enteroscopy  . INTRAOPERATIVE TRANSESOPHAGEAL ECHOCARDIOGRAM  06/13/2012   Procedure: INTRAOPERATIVE TRANSESOPHAGEAL ECHOCARDIOGRAM;  Surgeon: Grace Isaac, MD;  Location: [place];  Service: Open Heart Surgery;  Laterality: N/A;  . IR GENERIC HISTORICAL  07/26/2016   IR FLUORO GUIDE CV LINE RIGHT 07/26/2016 Greggory Keen, MD [facility]INTER[facility]  . IR GENERIC HISTORICAL  07/26/2016   IR US GUIDE VASC ACCESS RIGHT 07/26/2016 Greggory Keen, MD [facility]  . IR GENERIC HISTORICAL  08/02/2016   IR US GUIDE VASC ACCESS RIGHT 08/02/2016 Greggory Keen, MD [facility]  . IR GENERIC HISTORICAL  08/02/2016   IR FLUORO GUIDE CV LINE RIGHT 08/02/2016 Greggory Keen, MD [facility]  . LEFT HEART CATH AND CORONARY ANGIOGRAPHY N/A 09/20/2016   Procedure: Left Heart Cath and Coronary Angiography;  Surgeon: [name], MD;  Location: [place] CV LAB;  Service: Cardiovascular;  Laterality: N/A;  . LEFT HEART CATHETERIZATION WITH CORONARY ANGIOGRAM N/A 12/15/2011   Procedure: LEFT HEART CATHETERIZATION WITH CORONARY ANGIOGRAM;  Surgeon: Burnell Blanks, MD;  Location: [facility] CATH LAB;  Service: Cardiovascular;  Laterality: N/A;  . LEFT HEART CATHETERIZATION WITH CORONARY ANGIOGRAM N/A 01/10/2012   Procedure: LEFT HEART CATHETERIZATION WITH CORONARY  ANGIOGRAM;  Surgeon: Peter M Martinique, MD;  Location: [facility] CATH LAB;  Service: Cardiovascular;  Laterality: N/A;  . LEFT HEART CATHETERIZATION WITH CORONARY ANGIOGRAM N/A 06/08/2012   Procedure: LEFT HEART CATHETERIZATION WITH CORONARY ANGIOGRAM;  Surgeon: Burnell Blanks, MD;  Location: [facility] CATH LAB;  Service: Cardiovascular;  Laterality: N/A;  . LEFT HEART CATHETERIZATION WITH CORONARY/GRAFT ANGIOGRAM N/A 12/10/2013   Procedure: LEFT HEART CATHETERIZATION WITH Beatrix Fetters;  Surgeon: [name], MD;  Location: [facility] CATH LAB;  Service: Cardiovascular;  Laterality: N/A;  . OVARY SURGERY     ovarian cancer  . SHUNTOGRAM N/A 10/15/2013   Procedure: Fistulogram;  Surgeon: Serafina Mitchell, MD;  Location: [facility] CATH LAB;  Service: Cardiovascular;  Laterality: N/A;  . THROMBECTOMY / ARTERIOVENOUS GRAFT REVISION  2011   left upper arm  . TUBAL LIGATION  1980's   Family History  Problem Relation Age of Onset  . Heart disease Mother     Heart Disease  before age 80  . Hyperlipidemia Mother   . Hypertension Mother   . Diabetes Mother   . Heart attack Mother   . Heart disease Father     Heart Disease before age 73  . Hyperlipidemia Father   . Hypertension Father   . Diabetes Father   . Diabetes Sister   . Hypertension Sister   . Diabetes Brother   . Hyperlipidemia Brother   . Heart attack Brother   . Hypertension Sister   . Heart attack Brother   . Other      noncontributory for early CAD  . Colon cancer Neg Hx   . Esophageal cancer Neg Hx   . Liver disease Neg Hx   . Kidney disease Neg Hx   . Colon polyps Neg Hx    Social History:  reports that she has never smoked. She has never used smokeless tobacco. She reports that she does not drink alcohol or use drugs. Allergies  Allergen Reactions  . Aspirin Other (See Comments)    High Doses Mess up her stomach; "makes my bowels have blood in them". Takes 81 mg EC Aspirin   . Penicillins Other (See Comments)    SYNCOPE? ,  "makes me real weak when I take it; like I'll pass out"  Has patient had a PCN reaction causing immediate rash, facial/tongue/throat swelling, SOB or lightheadedness with hypotension: no Has patient had a PCN reaction causing severe rash involving mucus membranes or skin necrosis: no Has patient had a PCN reaction that required hospitalization no Has patient had a PCN reaction occurring within the last 10 years: no If all of the above  . Amlodipine Swelling and Other (See Comments)    Leg swelling  . Bactrim [Sulfamethoxazole-Trimethoprim] Rash  . Contrast Media [Iodinated Diagnostic Agents] Itching  . Iron Itching and Other (See Comments)    "they gave me iron in dialysis; had to give me Benadryl cause I had to have the iron" (05/02/2012)  . Nitrofurantoin Hives  . Prilosec [Omeprazole] Other (See Comments)    "back spasms"  . Tylenol [Acetaminophen] Other (See Comments)    Makes her feet on fire per pt  . Dexilant [Dexlansoprazole] Other (See Comments)    Upset stomach  . Levaquin [Levofloxacin In D5w] Rash  . Morphine And Related Itching    Itching in feet  . Plavix [Clopidogrel Bisulfate] Rash  . Protonix [Pantoprazole Sodium] Rash  . Venofer [Ferric Oxide] Itching    Patient reports using Benadryl prior to doses as [facility]   Prior to Admission medications   Medication Sig Start Date End Date Taking? Authorizing Provider  ALPRAZolam (XANAX) 0.25 MG tablet Take 0.25 mg by mouth 2 (two) times daily as needed for anxiety.  11/19/15  Yes Historical Provider, MD  amiodarone (PACERONE) 200 MG tablet Take 1 tablet (200 mg total) by mouth daily. 08/19/16  Yes Herminio Commons, MD  amLODipine (NORVASC) 2.5 MG tablet Take 2.5 mg by mouth daily.   Yes Historical Provider, MD  aspirin EC 81 MG tablet Take 81 mg by mouth daily.    Yes Historical Provider, MD  atorvastatin (LIPITOR) 40 MG tablet Take 1 tablet (40 mg total) by mouth daily at 6 PM. Patient taking differently: Take 40 mg  by mouth daily.  09/22/16  Yes Tiffany Carlota Raspberry, PA-C  carvedilol (COREG) 3.125 MG tablet Take 1 tablet (3.125 mg total) by mouth 2 (two) times daily with a meal. 09/22/16  Yes Delos Haring, PA-C  cloNIDine (CATAPRES) 0.2 MG tablet Take 1 tablet (0.2 mg total) by mouth 2 (two) times daily. 09/22/16  Yes Tiffany Carlota Raspberry, PA-C  epoetin alfa (EPOGEN,PROCRIT) 10626 UNIT/ML injection Inject 1 mL (10,000 Units total) into the vein every Monday, Wednesday, and Friday with hemodialysis. 04/11/16  Yes Orvan Falconer, MD  fluticasone (FLONASE) 50 MCG/ACT nasal spray Place 2 sprays into the nose daily as needed for allergies.    Yes Historical Provider, MD  hydrALAZINE (APRESOLINE) 50 MG tablet Take 1.5 tablets (75 mg total) by mouth 3 (three) times daily. 09/28/16  Yes Herminio Commons, MD  isosorbide mononitrate (IMDUR) 120 MG 24 hr tablet Take 1 tablet (120 mg total) by mouth daily. 09/22/16  Yes Tiffany Carlota Raspberry, PA-C  lanthanum (FOSRENOL) 1000 MG chewable tablet Chew 1 tablet (1,000 mg total) by mouth 3 (three) times daily with meals. Patient taking differently: Chew 1,000 mg by mouth 3 (three) times daily after meals.  04/08/16  Yes Orvan Falconer, MD  loratadine (CLARITIN) 10 MG tablet Take 10 mg by mouth daily as needed for allergies.   Yes Historical Provider, MD  metoprolol tartrate (LOPRESSOR) 25 MG tablet Take 12.5 mg by mouth 2 (two) times daily. 08/03/16  Yes Historical Provider, MD  multivitamin (RENA-VIT) TABS tablet Take 1 tablet by mouth daily.   Yes Historical Provider, MD  nitroGLYCERIN (NITROSTAT) 0.4 MG SL tablet Place 1 tablet (0.4 mg total) under the tongue every 5 (five) minutes as needed for chest pain. 08/02/16  Yes Kinnie Feil, MD  omeprazole (PRILOSEC) 20 MG capsule Take 20 mg by mouth daily.   Yes Historical Provider, MD  oxyCODONE-acetaminophen (PERCOCET/ROXICET) 5-325 MG tablet Take 1 tablet by mouth every 6 (six) hours as needed for pain. 09/06/16  Yes Historical Provider, MD  pantoprazole  (PROTONIX) 40 MG tablet Take 1 tablet (40 mg total) by mouth daily. 08/03/16  Yes Kinnie Feil, MD  SENSIPAR 30 MG tablet Take 30 mg by mouth daily with supper.  12/21/12  Yes Historical Provider, MD  simvastatin (ZOCOR) 20 MG tablet Take 1 tablet (20 mg total) by mouth at bedtime. 04/04/16  Yes Lendon Colonel, NP   Current Facility-Administered Medications  Medication Dose Route Frequency Provider Last Rate Last Dose  . 0.9 %  sodium chloride infusion  100 mL Intravenous PRN Edrick Oh, MD      . 0.9 %  sodium chloride infusion  100 mL Intravenous PRN Edrick Oh, MD      . albuterol (PROVENTIL) (2.5 MG/3ML) 0.083% nebulizer solution 2.5 mg  2.5 mg Nebulization Q2H PRN Norval Morton, MD      . ALPRAZolam Duanne Moron) tablet 0.25 mg  0.25 mg Oral BID PRN Norval Morton, MD      . alteplase (CATHFLO ACTIVASE) injection 2 mg  2 mg Intracatheter Once PRN Edrick Oh, MD      . amiodarone (PACERONE) tablet 200 mg  200 mg Oral Daily Rondell Charmayne Sheer, MD      . amLODipine (NORVASC) tablet 2.5 mg  2.5 mg Oral Daily Norval Morton, MD      . aspirin EC tablet 81 mg  81 mg Oral Daily Rondell A Tamala Julian, MD      . atorvastatin (LIPITOR) tablet 40 mg  40 mg Oral q1800 Rondell A Tamala Julian, MD      . carvedilol (COREG) tablet 3.125 mg  3.125 mg Oral BID WC Rondell A Tamala Julian, MD      . ceFEPIme (MAXIPIME) 1 g in  dextrose 5 % 50 mL IVPB  1 g Intravenous Q24H Rondell A Tamala Julian, MD      . cinacalcet (SENSIPAR) tablet 30 mg  30 mg Oral Q supper Rondell A Tamala Julian, MD      . cloNIDine (CATAPRES) tablet 0.2 mg  0.2 mg Oral BID Norval Morton, MD      . heparin injection 1,000 Units  1,000 Units Dialysis PRN Edrick Oh, MD      . heparin injection 5,000 Units  5,000 Units Subcutaneous Q8H Rondell A Tamala Julian, MD      . hydrALAZINE (APRESOLINE) tablet 75 mg  75 mg Oral Q8H Rondell A Tamala Julian, MD      . isosorbide mononitrate (IMDUR) 24 hr tablet 120 mg  120 mg Oral Daily Rondell Charmayne Sheer, MD      . lanthanum (FOSRENOL) chewable  tablet 1,000 mg  1,000 mg Oral TID PC Rondell A Smith, MD      . lidocaine (PF) (XYLOCAINE) 1 % injection 5 mL  5 mL Intradermal PRN Edrick Oh, MD      . lidocaine-prilocaine (EMLA) cream 1 application  1 application Topical PRN Edrick Oh, MD      . nitroGLYCERIN (NITROSTAT) SL tablet 0.4 mg  0.4 mg Sublingual Q5 min PRN Margette Fast, MD   0.4 mg at 10/09/16 2341  . ondansetron (ZOFRAN) tablet 4 mg  4 mg Oral Q6H PRN Norval Morton, MD       Or  . ondansetron (ZOFRAN) injection 4 mg  4 mg Intravenous Q6H PRN Norval Morton, MD      . pantoprazole (PROTONIX) EC tablet 40 mg  40 mg Oral Daily Rondell Charmayne Sheer, MD      . pentafluoroprop-tetrafluoroeth (GEBAUERS) aerosol 1 application  1 application Topical PRN Edrick Oh, MD      . sodium chloride flush (NS) 0.9 % injection 3 mL  3 mL Intravenous Q12H Rondell A Tamala Julian, MD      . vancomycin (VANCOCIN) 500 mg in sodium chloride 0.9 % 100 mL IVPB  500 mg Intravenous Q M,W,F-HD Norval Morton, MD       Labs: Basic Metabolic Panel:  Recent Labs Lab 10/09/16 2325 10/10/16 0450  NA 140 140  K 4.8 5.1  CL 96* 97*  CO2 24 26  GLUCOSE 139* 140*  BUN 54* 59*  CREATININE 9.96* 10.41*  CALCIUM 9.7 9.2   Liver Function Tests:  Recent Labs Lab 10/09/16 2325  AST 28  ALT 29  ALKPHOS 97  BILITOT 0.7  PROT 7.2  ALBUMIN 4.2   CBC:  Recent Labs Lab 10/09/16 2325 10/10/16 0450  WBC 12.0* 12.5*  NEUTROABS 9.0*  --   HGB 11.7* 10.7*  HCT 37.0 34.2*  MCV 96.9 97.7  PLT 212 199   Cardiac Enzymes:  Recent Labs Lab 10/10/16 0450  TROPONINI 9.32*   Studies/Results: Dg Chest Portable 1 View  Result Date: 10/09/2016 CLINICAL DATA:  Severe dyspnea, cough and congestion EXAM: PORTABLE CHEST 1 VIEW COMPARISON:  10/03/2016 FINDINGS: Cardiomegaly with aortic atherosclerosis. [place] B-lines consistent with CHF are identified bilaterally. Median sternotomy sutures and right-sided dialysis catheter tips are again visualized without  significant change. Superimposed airspace disease about the right hilum and right lung base cannot be entirely excluded. No acute osseous abnormality. IMPRESSION: Cardiomegaly with aortic atherosclerosis. Interval development of CHF. Superimposed airspace disease/pneumonia at the right lung base and about the right hilum is not entirely excluded. Electronically Signed   By: Shanon Brow  Randel Pigg M.D.   On: 10/09/2016 23:56    ROS: As per HPI. Limited due to bipap  Physical Exam: Vitals:   10/10/16 0810 10/10/16 0815 10/10/16 0830 10/10/16 0900  BP: (!) 170/84 (!) 172/87 (!) 159/79 (!) 172/71  Pulse: 66 70 66 66  Resp: 19 18 17 18   Temp:      TempSrc:      SpO2: 100%     Weight:      Height:         General: thin elderly chronically ill appearing AAF Head:  BiPAP in place Neck: Supple.  Lungs: dim BS base with bilateral crackles- Heart: RRR with S1 S2.  Abdomen: soft NT + BS Lower extremities:without sig edema or ischemic changes, no open wounds  Neuro: A & O  X 3. Moves all extremities spontaneously. Psych:  Responds to questions appropriately with a normal affect. Dialysis Access: right IJ - cath flow reduced to 250 due to alarming- old left AVF occluded  Dialysis Orders:  [facility] EDW 58 right IJ  3 hrs 2 K 2.5 Ca (NEEDS longer time given severe CAD) 300/600 max UF rate 1.1 L per hour  Hectorol 7 heparin 2000 bolus and 200 per hour  Needs benadryl 25 mg 25 minutes prior to giving venofer  Assessment/Plan: 1. Acute resp failure due to pulmonary edema with possible HCAP-on bipap/ cultures drawn, empiric abtx- wean Bipap as able - repeat CXR later today to see if additional UF is needed tomorrow. 2. ESRD -  MWF - HD today -was to have gotten a new access last week but procedure cancelled due to CP and ED visit 3. Hypertension/volume  -BP ^^ on admission tx with NTG drip;  lowering volume - need to reassess EDW while here- continue current meds- she would benefit but LONGER dialysis time at  discharge  4. Anemia  - hgb 10.7 -currently not on outpt ESA or Fe 5. Metabolic bone disease -  Ca 9.2- on sensiparfosrenol/hectorol 7 6. Nutrition - npo at present on renal carb mod diet when eating-vits 7. CAD/ hx NSTEMI March ^^ initial Troponin this admission up to 9 - cycling troponins- additional work up per primary/cards 8. PAF - on amio - in [place], PA-C [place] [phone] 10/10/2016, 9:40 AM   Pt seen, examined and agree w A/P as above.  Kelly Splinter MD [employer] pager [phone]   10/10/2016, 12:43 PM

## 2016-10-11 DIAGNOSIS — I214 Non-ST elevation (NSTEMI) myocardial infarction: Secondary | ICD-10-CM

## 2016-10-11 DIAGNOSIS — N186 End stage renal disease: Secondary | ICD-10-CM

## 2016-10-11 DIAGNOSIS — J810 Acute pulmonary edema: Secondary | ICD-10-CM

## 2016-10-11 LAB — TROPONIN I: Troponin I: 13.05 ng/mL (ref <0.03)

## 2016-10-11 LAB — BASIC METABOLIC PANEL
Anion gap: 12 (ref 5–15)
BUN: 30 mg/dL — ABNORMAL HIGH (ref 6–20)
CALCIUM: 9.3 mg/dL (ref 8.9–10.3)
CHLORIDE: 97 mmol/L — AB (ref 101–111)
CO2: 28 mmol/L (ref 22–32)
CREATININE: 6.71 mg/dL — AB (ref 0.44–1.00)
GFR calc non Af Amer: 5 mL/min — ABNORMAL LOW (ref >60)
GFR, EST AFRICAN AMERICAN: 6 mL/min — AB (ref >60)
GLUCOSE: 105 mg/dL — AB (ref 65–99)
Potassium: 4.6 mmol/L (ref 3.5–5.1)
Sodium: 137 mmol/L (ref 135–145)

## 2016-10-11 LAB — CBC
HEMATOCRIT: 28.5 % — AB (ref 36.0–46.0)
HEMOGLOBIN: 8.9 g/dL — AB (ref 12.0–15.0)
MCH: 30.5 pg (ref 26.0–34.0)
MCHC: 31.2 g/dL (ref 30.0–36.0)
MCV: 97.6 fL (ref 78.0–100.0)
Platelets: 150 10*3/uL (ref 150–400)
RBC: 2.92 MIL/uL — ABNORMAL LOW (ref 3.87–5.11)
RDW: 17.4 % — AB (ref 11.5–15.5)
WBC: 7.7 10*3/uL (ref 4.0–10.5)

## 2016-10-11 MED ORDER — LIDOCAINE-PRILOCAINE 2.5-2.5 % EX CREA: 1.0000 "application " | TOPICAL_CREAM | CUTANEOUS | Status: DC | PRN

## 2016-10-11 MED ORDER — LIDOCAINE HCL (PF) 1 % IJ SOLN: 5.0000 mL | INTRAMUSCULAR | Status: DC | PRN

## 2016-10-11 MED ORDER — HEPARIN SODIUM (PORCINE) 1000 UNIT/ML DIALYSIS: 1000.0000 [IU] | INTRAMUSCULAR | Status: DC | PRN

## 2016-10-11 MED ORDER — DOXYCYCLINE HYCLATE 100 MG PO TABS
100.0000 mg | ORAL_TABLET | Freq: Two times a day (BID) | ORAL | Status: DC
  Administered 2016-10-11 – 2016-10-14 (×7): 100 mg via ORAL
  Filled 2016-10-11 (×8): qty 1

## 2016-10-11 MED ORDER — PENTAFLUOROPROP-TETRAFLUOROETH EX AERO: 1.0000 "application " | INHALATION_SPRAY | CUTANEOUS | Status: DC | PRN

## 2016-10-11 MED ORDER — SODIUM CHLORIDE 0.9 % IV SOLN: 100.0000 mL | INTRAVENOUS | Status: DC | PRN

## 2016-10-11 MED ORDER — HYDRALAZINE HCL 20 MG/ML IJ SOLN
10.0000 mg | Freq: Once | INTRAMUSCULAR | Status: AC
  Filled 2016-10-11: qty 1

## 2016-10-11 MED ORDER — ALTEPLASE 2 MG IJ SOLR: 2.0000 mg | Freq: Once | INTRAMUSCULAR | Status: DC | PRN

## 2016-10-11 MED ORDER — AMLODIPINE BESYLATE 5 MG PO TABS
2.5000 mg | ORAL_TABLET | Freq: Every day | ORAL | Status: DC
  Administered 2016-10-11 – 2016-10-13 (×3): 2.5 mg via ORAL
  Filled 2016-10-11 (×3): qty 1

## 2016-10-11 MED ORDER — HEPARIN SODIUM (PORCINE) 1000 UNIT/ML DIALYSIS: 2000.0000 [IU] | INTRAMUSCULAR | Status: DC | PRN

## 2016-10-11 NOTE — Progress Notes (Signed)
Pt transferred from 6East to 4E03.  Pt A&Ox4, oriented to new surroundings and is accompained by husband.  No nitro drip orders at this time as BP has decreased from earlier in the day.  Will continue to monitor pt.

## 2016-10-11 NOTE — Progress Notes (Signed)
  [facility] Progress Note   Subjective: breathing much better, dyspnea resolved  Vitals:   10/10/16 2059 10/10/16 2324 10/11/16 0312 10/11/16 0755  BP: (!) 141/59 (!) 185/87 (!) 118/46 (!) 116/47  Pulse: 69 82 60 (!) 54  Resp: 16 16 17 11   Temp: 98.7 F (37.1 C) 98.9 F (37.2 C) 98.9 F (37.2 C) 98.1 F (36.7 C)  TempSrc: Oral Oral Oral Oral  SpO2: 100% 100% 100% 100%  Weight:      Height:        Inpatient medications: . amiodarone  200 mg Oral Daily  . amLODipine  2.5 mg Oral Daily  . aspirin EC  81 mg Oral Daily  . atorvastatin  40 mg Oral q1800  . carvedilol  3.125 mg Oral BID WC  . cinacalcet  30 mg Oral Q supper  . cloNIDine  0.2 mg Oral BID  . doxercalciferol  7 mcg Intravenous Q M,W,F-HD  . doxycycline  100 mg Oral Q12H  . heparin  5,000 Units Subcutaneous Q8H  . hydrALAZINE  75 mg Oral Q8H  . isosorbide mononitrate  120 mg Oral Daily  . lanthanum  1,000 mg Oral TID PC  . multivitamin  1 tablet Oral QHS  . pantoprazole  40 mg Oral Daily  . sodium chloride flush  3 mL Intravenous Q12H    albuterol, ALPRAZolam, nitroGLYCERIN, ondansetron **OR** ondansetron (ZOFRAN) IV  Exam: Alert,  No jvd Chest dec'd at bases, no rales, no ^wob RRR no RG ABd soft ntnd no ascites Trace LE edema R IJ cath , old AVF L arm  Dialysis: [facility] MWF   3h  2/2.5 bath  58kg   300/600   Hep 2000 then 200/hr - Hect 7ug - Needs benadryl 25 mg 25 minutes prior to giving venofer      Assessment: 1. Acute pulm edema/ resp failure - better at dry wt, but will need more vol off 2. ESRD MWF hd, using TDC, has failed LUA AVF and had severe steal acutely after R arm AVG placed requiring immediate removal 3. HTN - on 5 BP meds 4. +troponin - w/u per cards, recent cath/NSTEMI 5. Anemia not on esa or Fe now 6. CKD/ MBD - cont meds 7. PAF - on amio, in NSR  Plan - extra HD today get more fluid off    Kelly Splinter MD [place] pager  [phone]   10/11/2016, 10:28 AM    Recent Labs Lab 10/09/16 2325 10/10/16 0450 10/11/16 0219  NA 140 140 137  K 4.8 5.1 4.6  CL 96* 97* 97*  CO2 24 26 28   GLUCOSE 139* 140* 105*  BUN 54* 59* 30*  CREATININE 9.96* 10.41* 6.71*  CALCIUM 9.7 9.2 9.3    Recent Labs Lab 10/09/16 2325  AST 28  ALT 29  ALKPHOS 97  BILITOT 0.7  PROT 7.2  ALBUMIN 4.2    Recent Labs Lab 10/09/16 2325 10/10/16 0450 10/11/16 0219  WBC 12.0* 12.5* 7.7  NEUTROABS 9.0*  --   --   HGB 11.7* 10.7* 8.9*  HCT 37.0 34.2* 28.5*  MCV 96.9 97.7 97.6  PLT 212 199 150   Iron/TIBC/Ferritin/ %Sat    Component Value Date/Time   IRON 62 09/19/2016 2124   TIBC 230 (L) 09/19/2016 2124   FERRITIN 1,122 (H) 09/19/2016 2124   IRONPCTSAT 27 09/19/2016 2124

## 2016-10-11 NOTE — Progress Notes (Signed)
PROGRESS NOTE    Shawna [name]  BPZ:[id number] DOB: 10/07/1940 DOA: 10/09/2016 PCP: [name], MD     Brief Narrative:  Shawna [name] is a 76 y.o. female with medical history significant of ESRD on HD (M/W/F), CHF, CAD s/p CABG, NSTEMI, HTN, GI bleed from AVM; who presents with complaints of chest pain last night around 9:30 PM.  Associated symptoms included acute onset of shortness of breath. Patient reportedly tried taking 3 nitroglycerin without relief of symptoms. Her last dialysis session was on Friday and she felt like she had extra fluid still on her at that time. Upon admission into the emergency department patient was seen have significant signs of pulmonary edema and was placed on BiPAP with some improvement of respiratory distress symptoms. Dr. Justin Mend of Nephrology was consulted and patient to receive dialysis. She was placed on a nitroglycerin drip for hypertensive emergency.   Assessment & Plan:   Principal Problem:   Acute on chronic respiratory failure with hypoxia (HCC) Active Problems:   ESRD on hemodialysis (HCC)   Chronic diastolic CHF (congestive heart failure) (HCC)   Hypertensive urgency   Fluid overload  Acute respiratory failure with hypoxemia secondary to fluid overload - Now off BiPAP - Improved after hemodialysis - Cont [place] O2   Chest pain CAD s/p CABG with recent NSTEMI - Seen by cardiology last admission s/p heart cath showed high grade stent stenosis of DES. PCI not done due to high rate of restenosis, also patient with hx GI bleed. Imdur increased to 120mg  daily. Need aggressive control of blood pressure  - Trend troponin, repeat pending this morning  - Continue aspirin, lipitor  - Cardiology consulted  - Echo pending - No CP this morning   Sepsis secondary to HCAP  - Sepsis pathology improving  - Blood cultures pending  - Deescalate antibiotics today   Hypertensive urgency - Now off nitro drip  - Continue clonidine 0.2mg  BID, coreg 3.125mg  BID,  hydralazine 50mg  q8h, amlodipine 2.5mg  daily  - BP improved   ESRD on HD - Per nephrology   Paroxysmal A fib - Continue amiodarone   GERD - Continue Protonix   DVT prophylaxis: heparin subq Code Status: full Family Communication: at bedside Disposition Plan: pending further work up    Consultants:   Nephrology  Cardiology   Procedures:   None  Antimicrobials:  Anti-infectives    Start     Dose/Rate Route Frequency Ordered Stop   10/12/16 1200  vancomycin (VANCOCIN) 500 mg in sodium chloride 0.9 % 100 mL IVPB     500 mg 100 mL/hr over 60 Minutes Intravenous Every M-W-F (Hemodialysis) 10/10/16 1046     10/11/16 2200  ceFEPIme (MAXIPIME) 1 g in dextrose 5 % 50 mL IVPB     1 g 100 mL/hr over 30 Minutes Intravenous Every 24 hours 10/10/16 1039     10/10/16 1400  ceFEPIme (MAXIPIME) 2 g in dextrose 5 % 50 mL IVPB     2 g 100 mL/hr over 30 Minutes Intravenous  Once 10/10/16 1039 10/10/16 1619   10/10/16 1200  vancomycin (VANCOCIN) 500 mg in sodium chloride 0.9 % 100 mL IVPB  Status:  Discontinued     500 mg 100 mL/hr over 60 Minutes Intravenous Every M-W-F (Hemodialysis) 10/10/16 0431 10/10/16 1046   10/10/16 1200  vancomycin (VANCOCIN) IVPB 750 mg/150 ml premix     750 mg 150 mL/hr over 60 Minutes Intravenous Every M-W-F (Hemodialysis) 10/10/16 1046 10/10/16 1210   10/10/16 1046  Vancomycin (VANCOCIN) 750-5 MG/150ML-% IVPB    Comments:  Kiang, Angelito   : cabinet override      10/10/16 1046 10/10/16 1248   10/10/16 1000  ceFEPIme (MAXIPIME) 1 g in dextrose 5 % 50 mL IVPB  Status:  Discontinued     1 g 100 mL/hr over 30 Minutes Intravenous Every 24 hours 10/10/16 0431 10/10/16 1039   10/10/16 0430  ceFEPIme (MAXIPIME) 2 g in dextrose 5 % 50 mL IVPB  Status:  Discontinued     2 g 100 mL/hr over 30 Minutes Intravenous  Once 10/10/16 0421 10/10/16 0426   10/10/16 0000  aztreonam (AZACTAM) 2 g in dextrose 5 % 50 mL IVPB     2 g 100 mL/hr over 30 Minutes Intravenous   Once 10/09/16 2359 10/10/16 0212   10/10/16 0000  vancomycin (VANCOCIN) IVPB 1000 mg/200 mL premix     1,000 mg 200 mL/hr over 60 Minutes Intravenous  Once 10/09/16 2359 10/10/16 0311      Subjective: Patient had chest pain yesterday afternoon with elevated BP. Cardiology was consulted. Patient transferred to step down due to possible nitro drip needs, but her BP improved and she did not require nitro drip overnight. This morning, she is resting comfortably in bed, denies chest pain or shortness of breath, no nausea or vomiting. No complaints today.   Objective: Vitals:   10/10/16 2059 10/10/16 2324 10/11/16 0312 10/11/16 0755  BP: (!) 141/59 (!) 185/87 (!) 118/46 (!) 116/47  Pulse: 69 82 60 (!) 54  Resp: 16 16 17 11   Temp: 98.7 F (37.1 C) 98.9 F (37.2 C) 98.9 F (37.2 C) 98.1 F (36.7 C)  TempSrc: Oral Oral Oral Oral  SpO2: 100% 100% 100% 100%  Weight:      Height:        Intake/Output Summary (Last 24 hours) at 10/11/16 0829 Last data filed at 10/11/16 0000  Gross per 24 hour  Intake              240 ml  Output             3150 ml  Net            -2910 ml   Filed Weights   10/09/16 2316  Weight: 58.5 kg (129 lb)    Examination:  General exam: Appears calm and comfortable  Respiratory system: Crackles in right base, Respiratory effort normal. Cardiovascular system: S1 & S2 heard, RRR. No JVD, murmurs, rubs, gallops or clicks. Trace pedal edema. Gastrointestinal system: Abdomen is nondistended, soft and nontender. No organomegaly or masses felt. Normal bowel sounds heard. Central nervous system: Alert and oriented. No focal neurological deficits. Extremities: Symmetric 5 x 5 power. Skin: No rashes, lesions or ulcers Psychiatry: Judgement and insight appear normal. Mood & affect appropriate.   Data Reviewed: I have personally reviewed following labs and imaging studies  CBC:  Recent Labs Lab 10/09/16 2325 10/10/16 0450 10/11/16 0219  WBC 12.0* 12.5* 7.7    NEUTROABS 9.0*  --   --   HGB 11.7* 10.7* 8.9*  HCT 37.0 34.2* 28.5*  MCV 96.9 97.7 97.6  PLT 212 199 756   Basic Metabolic Panel:  Recent Labs Lab 10/09/16 2325 10/10/16 0450 10/11/16 0219  NA 140 140 137  K 4.8 5.1 4.6  CL 96* 97* 97*  CO2 24 26 28   GLUCOSE 139* 140* 105*  BUN 54* 59* 30*  CREATININE 9.96* 10.41* 6.71*  CALCIUM 9.7 9.2 9.3   GFR:  Estimated Creatinine Clearance: 5.6 mL/min (A) (by C-G formula based on SCr of 6.71 mg/dL (H)). Liver Function Tests:  Recent Labs Lab 10/09/16 2325  AST 28  ALT 29  ALKPHOS 97  BILITOT 0.7  PROT 7.2  ALBUMIN 4.2   No results for input(s): LIPASE, AMYLASE in the last 168 hours. No results for input(s): AMMONIA in the last 168 hours. Coagulation Profile:  Recent Labs Lab 10/09/16 2325  INR 0.91   Cardiac Enzymes:  Recent Labs Lab 10/10/16 0450 10/10/16 1513  TROPONINI 9.32* 23.73*   BNP (last 3 results) No results for input(s): PROBNP in the last 8760 hours. HbA1C: No results for input(s): HGBA1C in the last 72 hours. CBG: No results for input(s): GLUCAP in the last 168 hours. Lipid Profile: No results for input(s): CHOL, HDL, LDLCALC, TRIG, CHOLHDL, LDLDIRECT in the last 72 hours. Thyroid Function Tests: No results for input(s): TSH, T4TOTAL, FREET4, T3FREE, THYROIDAB in the last 72 hours. Anemia Panel: No results for input(s): VITAMINB12, FOLATE, FERRITIN, TIBC, IRON, RETICCTPCT in the last 72 hours. Sepsis Labs:  Recent Labs Lab 10/09/16 2337 10/10/16 0450 10/10/16 0723  PROCALCITON  --  0.55  --   LATICACIDVEN 2.00* 2.2* 2.1*    Recent Results (from the past 240 hour(s))  Blood Culture (routine x 2)     Status: None (Preliminary result)   Collection Time: 10/10/16 12:44 AM  Result Value Ref Range Status   Specimen Description BLOOD LEFT ARM  Final   Special Requests   Final    BOTTLES DRAWN AEROBIC AND ANAEROBIC Blood Culture adequate volume   Culture PENDING  Incomplete   Report  Status PENDING  Incomplete  Blood Culture (routine x 2)     Status: None (Preliminary result)   Collection Time: 10/10/16  1:24 AM  Result Value Ref Range Status   Specimen Description BLOOD RIGHT ARM  Final   Special Requests IN PEDIATRIC BOTTLE Blood Culture adequate volume  Final   Culture PENDING  Incomplete   Report Status PENDING  Incomplete       Radiology Studies: Dg Chest Port 1 View  Result Date: 10/10/2016 CLINICAL DATA:  Initial evaluation for pulmonary edema. Shortness of breath with chest pain. EXAM: PORTABLE CHEST 1 VIEW COMPARISON:  Prior radiograph from 10/09/2016. FINDINGS: A right-sided dual-lumen dialysis catheter in place, stable. Median sternotomy wires with underlying CABG markers and surgical clips unchanged. Stable cardiomegaly. Mediastinal silhouette within normal limits. Aortic atherosclerosis. Lungs normally inflated. Diffuse vascular congestion with scattered interstitial prominence in Kerley B-lines, compatible pulmonary edema, worse within the right lung. Small bilateral pleural effusions, left greater than right. No focal infiltrates. No pneumothorax. No acute osseous abnormality. IMPRESSION: 1. Cardiomegaly with mild diffuse pulmonary edema and small bilateral pleural effusions, consistent with acute CHF exacerbation. 2. Sequelae of prior CABG. 3. Aortic atherosclerosis. Electronically Signed   By: Jeannine Boga M.D.   On: 10/10/2016 15:48   Dg Chest Portable 1 View  Result Date: 10/09/2016 CLINICAL DATA:  Severe dyspnea, cough and congestion EXAM: PORTABLE CHEST 1 VIEW COMPARISON:  10/03/2016 FINDINGS: Cardiomegaly with aortic atherosclerosis. Kerley B-lines consistent with CHF are identified bilaterally. Median sternotomy sutures and right-sided dialysis catheter tips are again visualized without significant change. Superimposed airspace disease about the right hilum and right lung base cannot be entirely excluded. No acute osseous abnormality. IMPRESSION:  Cardiomegaly with aortic atherosclerosis. Interval development of CHF. Superimposed airspace disease/pneumonia at the right lung base and about the right hilum is not entirely excluded. Electronically  Signed   By: Ashley Royalty M.D.   On: 10/09/2016 23:56      Scheduled Meds: . amiodarone  200 mg Oral Daily  . amLODipine  2.5 mg Oral Daily  . aspirin EC  81 mg Oral Daily  . atorvastatin  40 mg Oral q1800  . carvedilol  3.125 mg Oral BID WC  . ceFEPime (MAXIPIME) IV  1 g Intravenous Q24H  . cinacalcet  30 mg Oral Q supper  . cloNIDine  0.2 mg Oral BID  . doxercalciferol  7 mcg Intravenous Q M,W,F-HD  . heparin  5,000 Units Subcutaneous Q8H  . hydrALAZINE  75 mg Oral Q8H  . isosorbide mononitrate  120 mg Oral Daily  . lanthanum  1,000 mg Oral TID PC  . multivitamin  1 tablet Oral QHS  . pantoprazole  40 mg Oral Daily  . sodium chloride flush  3 mL Intravenous Q12H  . [START ON 10/12/2016] vancomycin  500 mg Intravenous Q M,W,F-HD   Continuous Infusions:   LOS: 1 day    Time spent: 30 minutes   Dessa Phi, DO [facility] [email or web address] Password [email or web address] 10/11/2016, 8:29 AM

## 2016-10-11 NOTE — Progress Notes (Signed)
The patient has been seen in conjunction with Harlan Stains, NP-C. All aspects of care have been considered and discussed. The patient has been personally interviewed, examined, and all clinical data has been reviewed.   Enzyme pattern is compatible with acute coronary syndrome/non-STEMI..  Presentation was also that of accelerated hypertension and pulmonary edema.  Awaiting echo result to evaluate LV function. Looking for new regional wall motion abnormality. If non-STEMI is in the diagonal distribution, echocardiogram will not look much different than previous study.  If possible, more aggressive fluid removal may help better control her blood pressure. She is already on multiple blood pressure medications.   Progress Note  Patient Name: Shawna [name] Date of Encounter: 10/11/2016  Primary Cardiologist: [place] is better today. No further chest pain.   Inpatient Medications    Scheduled Meds: . amiodarone  200 mg Oral Daily  . amLODipine  2.5 mg Oral Daily  . aspirin EC  81 mg Oral Daily  . atorvastatin  40 mg Oral q1800  . carvedilol  3.125 mg Oral BID WC  . cinacalcet  30 mg Oral Q supper  . cloNIDine  0.2 mg Oral BID  . doxercalciferol  7 mcg Intravenous Q M,W,F-HD  . doxycycline  100 mg Oral Q12H  . heparin  5,000 Units Subcutaneous Q8H  . hydrALAZINE  75 mg Oral Q8H  . isosorbide mononitrate  120 mg Oral Daily  . lanthanum  1,000 mg Oral TID PC  . multivitamin  1 tablet Oral QHS  . pantoprazole  40 mg Oral Daily  . sodium chloride flush  3 mL Intravenous Q12H   Continuous Infusions:  PRN Meds: albuterol, ALPRAZolam, nitroGLYCERIN, ondansetron **OR** ondansetron (ZOFRAN) IV   Vital Signs    Vitals:   10/10/16 2059 10/10/16 2324 10/11/16 0312 10/11/16 0755  BP: (!) 141/59 (!) 185/87 (!) 118/46 (!) 116/47  Pulse: 69 82 60 (!) 54  Resp: 16 16 17 11   Temp: 98.7 F (37.1 C) 98.9 F (37.2 C) 98.9 F (37.2 C) 98.1 F (36.7 C)    TempSrc: Oral Oral Oral Oral  SpO2: 100% 100% 100% 100%  Weight:      Height:        Intake/Output Summary (Last 24 hours) at 10/11/16 1121 Last data filed at 10/11/16 0000  Gross per 24 hour  Intake              240 ml  Output             3150 ml  Net            -2910 ml   Filed Weights   10/09/16 2316  Weight: 129 lb (58.5 kg)    Telemetry    SR - Personally Reviewed  ECG    SR with LVH - Personally Reviewed  Physical Exam   General: Well developed, well nourished, older AA female appearing in no acute distress. Head: Normocephalic, atraumatic.  Neck: Supple without bruits, JVD. Lungs:  Resp regular and unlabored, Diminished bilaterally. Heart: RRR, S1, S2, no S3, S4, or murmur; no rub. Abdomen: Soft, non-tender, non-distended with normoactive bowel sounds. No hepatomegaly. No rebound/guarding. No obvious abdominal masses. Extremities: No clubbing, cyanosis, trace LE edema. Distal pedal pulses are 2+ bilaterally. LUA fistula, HD cath right chest. Neuro: Alert and oriented X 3. Moves all extremities spontaneously. Psych: Normal affect.  Labs    Chemistry Recent Labs Lab 10/09/16 2325 10/10/16 0450 10/11/16 0219  NA  140 140 137  K 4.8 5.1 4.6  CL 96* 97* 97*  CO2 24 26 28   GLUCOSE 139* 140* 105*  BUN 54* 59* 30*  CREATININE 9.96* 10.41* 6.71*  CALCIUM 9.7 9.2 9.3  PROT 7.2  --   --   ALBUMIN 4.2  --   --   AST 28  --   --   ALT 29  --   --   ALKPHOS 97  --   --   BILITOT 0.7  --   --   GFRNONAA 3* 3* 5*  GFRAA 4* 4* 6*  ANIONGAP 20* 17* 12     Hematology Recent Labs Lab 10/09/16 2325 10/10/16 0450 10/11/16 0219  WBC 12.0* 12.5* 7.7  RBC 3.82* 3.50* 2.92*  HGB 11.7* 10.7* 8.9*  HCT 37.0 34.2* 28.5*  MCV 96.9 97.7 97.6  MCH 30.6 30.6 30.5  MCHC 31.6 31.3 31.2  RDW 17.6* 17.8* 17.4*  PLT 212 199 150    Cardiac Enzymes Recent Labs Lab 10/10/16 0450 10/10/16 1513  TROPONINI 9.32* 23.73*    Recent Labs Lab 10/09/16 2335  TROPIPOC  0.06     BNPNo results for input(s): BNP, PROBNP in the last 168 hours.   DDimer No results for input(s): DDIMER in the last 168 hours.    Radiology    Dg Chest Port 1 View  Result Date: 10/10/2016 CLINICAL DATA:  Initial evaluation for pulmonary edema. Shortness of breath with chest pain. EXAM: PORTABLE CHEST 1 VIEW COMPARISON:  Prior radiograph from 10/09/2016. FINDINGS: A right-sided dual-lumen dialysis catheter in place, stable. Median sternotomy wires with underlying CABG markers and surgical clips unchanged. Stable cardiomegaly. Mediastinal silhouette within normal limits. Aortic atherosclerosis. Lungs normally inflated. Diffuse vascular congestion with scattered interstitial prominence in Kerley B-lines, compatible pulmonary edema, worse within the right lung. Small bilateral pleural effusions, left greater than right. No focal infiltrates. No pneumothorax. No acute osseous abnormality. IMPRESSION: 1. Cardiomegaly with mild diffuse pulmonary edema and small bilateral pleural effusions, consistent with acute CHF exacerbation. 2. Sequelae of prior CABG. 3. Aortic atherosclerosis. Electronically Signed   By: Jeannine Boga M.D.   On: 10/10/2016 15:48   Dg Chest Portable 1 View  Result Date: 10/09/2016 CLINICAL DATA:  Severe dyspnea, cough and congestion EXAM: PORTABLE CHEST 1 VIEW COMPARISON:  10/03/2016 FINDINGS: Cardiomegaly with aortic atherosclerosis. Kerley B-lines consistent with CHF are identified bilaterally. Median sternotomy sutures and right-sided dialysis catheter tips are again visualized without significant change. Superimposed airspace disease about the right hilum and right lung base cannot be entirely excluded. No acute osseous abnormality. IMPRESSION: Cardiomegaly with aortic atherosclerosis. Interval development of CHF. Superimposed airspace disease/pneumonia at the right lung base and about the right hilum is not entirely excluded. Electronically Signed   By: Ashley Royalty  M.D.   On: 10/09/2016 23:56    Cardiac Studies   TTE: Pending  Patient Profile     76 y.o. female PMH of CAD s/p CABG with recent NSTEMI, HTN, GI bleed from AVM and ESRD on HD who presented with chest pain and elevated troponin.   Assessment & Plan    1. Chest pain: Sudden onset with dyspnea. Similar to previous pain with her last cath. This was in the setting of volume overload, and sepsis 2/2 PNA. Last cath noted diffuse disease with patent grafts. Small caliber vessel first diag disease noted, but felt to be high risk for PCI and her allergies. Treated medically. Trop up to 23 this admission in the setting  of acute illness and volume overload. EKG showed diffuse ST depressions with elevated rate.  -- continue to cycle trop, may have occluded diag that was 99% on last cath, but has a SVG that was patent.  -- as she is without chest pain, continue with plan to check echo to determine new significant WMA, if so may need repeat cath. When questioned about allergies to ASA, plavix states she can take 81mg  but thinks she did have reaction to plavix. Hx of GI bleed  2. HTN: Initially hypertensive on admission and required nitro gtt. Blood pressure has since improve, with medications and HD.   3. ESRD on HD: Followed by nephrology, had HD yesterday. Now at dry weight, but planned or HD again today for more volume removal.     4. SIRS 2/2 HCAP: elevated WBC and lactic acid. Antibiotics per primary  Signed, Reino Bellis, NP  10/11/2016, 11:21 AM

## 2016-10-11 NOTE — Progress Notes (Deleted)
  [facility] Progress Note   Subjective: breathing much better, dyspnea resolved  Vitals:   10/10/16 2059 10/10/16 2324 10/11/16 0312 10/11/16 0755  BP: (!) 141/59 (!) 185/87 (!) 118/46 (!) 116/47  Pulse: 69 82 60 (!) 54  Resp: 16 16 17 11   Temp: 98.7 F (37.1 C) 98.9 F (37.2 C) 98.9 F (37.2 C) 98.1 F (36.7 C)  TempSrc: Oral Oral Oral Oral  SpO2: 100% 100% 100% 100%  Weight:      Height:        Inpatient medications: . amiodarone  200 mg Oral Daily  . amLODipine  2.5 mg Oral Daily  . aspirin EC  81 mg Oral Daily  . atorvastatin  40 mg Oral q1800  . carvedilol  3.125 mg Oral BID WC  . cinacalcet  30 mg Oral Q supper  . cloNIDine  0.2 mg Oral BID  . doxercalciferol  7 mcg Intravenous Q M,W,F-HD  . doxycycline  100 mg Oral Q12H  . heparin  5,000 Units Subcutaneous Q8H  . hydrALAZINE  75 mg Oral Q8H  . isosorbide mononitrate  120 mg Oral Daily  . lanthanum  1,000 mg Oral TID PC  . multivitamin  1 tablet Oral QHS  . pantoprazole  40 mg Oral Daily  . sodium chloride flush  3 mL Intravenous Q12H    albuterol, ALPRAZolam, nitroGLYCERIN, ondansetron **OR** ondansetron (ZOFRAN) IV  Exam: Alert,  No jvd Chest dec'd at bases, no rales, no ^wob RRR no RG ABd soft ntnd no ascites Trace LE edema R IJ cath , old AVF L arm  Dialysis: [facility] MWF   3h  2/2.5 bath  58kg   300/600   Hep 2000 then 200/hr - Hect 7ug - Needs benadryl 25 mg 25 minutes prior to giving venofer      Assessment: 1. Acute pulm edema/ resp failure - better at dry wt, but will need more vol off 2. ESRD MWF hd, using TDC, has failed LUA AVF and had severe steal acutely after R arm AVG placed requiring immediate removal 3. HTN - on 5 BP meds 4. +troponin - w/u per cards, recent cath/NSTEMI 5. Anemia not on esa or Fe now 6. CKD/ MBD - cont meds 7. PAF - on amio, in NSR  Plan - extra HD today get more fluid off    Kelly Splinter MD [place] pager [phone]    10/11/2016, 10:16 AM    Recent Labs Lab 10/09/16 2325 10/10/16 0450 10/11/16 0219  NA 140 140 137  K 4.8 5.1 4.6  CL 96* 97* 97*  CO2 24 26 28   GLUCOSE 139* 140* 105*  BUN 54* 59* 30*  CREATININE 9.96* 10.41* 6.71*  CALCIUM 9.7 9.2 9.3    Recent Labs Lab 10/09/16 2325  AST 28  ALT 29  ALKPHOS 97  BILITOT 0.7  PROT 7.2  ALBUMIN 4.2    Recent Labs Lab 10/09/16 2325 10/10/16 0450 10/11/16 0219  WBC 12.0* 12.5* 7.7  NEUTROABS 9.0*  --   --   HGB 11.7* 10.7* 8.9*  HCT 37.0 34.2* 28.5*  MCV 96.9 97.7 97.6  PLT 212 199 150   Iron/TIBC/Ferritin/ %Sat    Component Value Date/Time   IRON 62 09/19/2016 2124   TIBC 230 (L) 09/19/2016 2124   FERRITIN 1,122 (H) 09/19/2016 2124   IRONPCTSAT 27 09/19/2016 2124

## 2016-10-12 ENCOUNTER — Inpatient Hospital Stay (HOSPITAL_COMMUNITY): Payer: Medicare HMO

## 2016-10-12 DIAGNOSIS — Z992 Dependence on renal dialysis: Secondary | ICD-10-CM

## 2016-10-12 DIAGNOSIS — R079 Chest pain, unspecified: Secondary | ICD-10-CM

## 2016-10-12 LAB — BASIC METABOLIC PANEL
Anion gap: 12 (ref 5–15)
BUN: 19 mg/dL (ref 6–20)
CALCIUM: 9.3 mg/dL (ref 8.9–10.3)
CO2: 24 mmol/L (ref 22–32)
CREATININE: 5.22 mg/dL — AB (ref 0.44–1.00)
Chloride: 100 mmol/L — ABNORMAL LOW (ref 101–111)
GFR calc non Af Amer: 7 mL/min — ABNORMAL LOW (ref >60)
GFR, EST AFRICAN AMERICAN: 8 mL/min — AB (ref >60)
Glucose, Bld: 102 mg/dL — ABNORMAL HIGH (ref 65–99)
Potassium: 4.3 mmol/L (ref 3.5–5.1)
SODIUM: 136 mmol/L (ref 135–145)

## 2016-10-12 LAB — MAGNESIUM: Magnesium: 1.7 mg/dL (ref 1.7–2.4)

## 2016-10-12 LAB — CBC
HEMATOCRIT: 29.4 % — AB (ref 36.0–46.0)
Hemoglobin: 9.4 g/dL — ABNORMAL LOW (ref 12.0–15.0)
MCH: 31.5 pg (ref 26.0–34.0)
MCHC: 32 g/dL (ref 30.0–36.0)
MCV: 98.7 fL (ref 78.0–100.0)
Platelets: 168 10*3/uL (ref 150–400)
RBC: 2.98 MIL/uL — ABNORMAL LOW (ref 3.87–5.11)
RDW: 17.6 % — AB (ref 11.5–15.5)
WBC: 8.3 10*3/uL (ref 4.0–10.5)

## 2016-10-12 LAB — ECHOCARDIOGRAM COMPLETE
Height: 62 in
Weight: 2056.45 oz

## 2016-10-12 LAB — HEPARIN LEVEL (UNFRACTIONATED): HEPARIN UNFRACTIONATED: 0.41 [IU]/mL (ref 0.30–0.70)

## 2016-10-12 IMAGING — CR DG CHEST 1V PORT
1 series · 1 of 1 positions shown · non-contrast
Comparison: [DATE]

CLINICAL DATA: Pulmonary edema, chest pain last night

EXAM:
PORTABLE CHEST 1 VIEW

[AP]
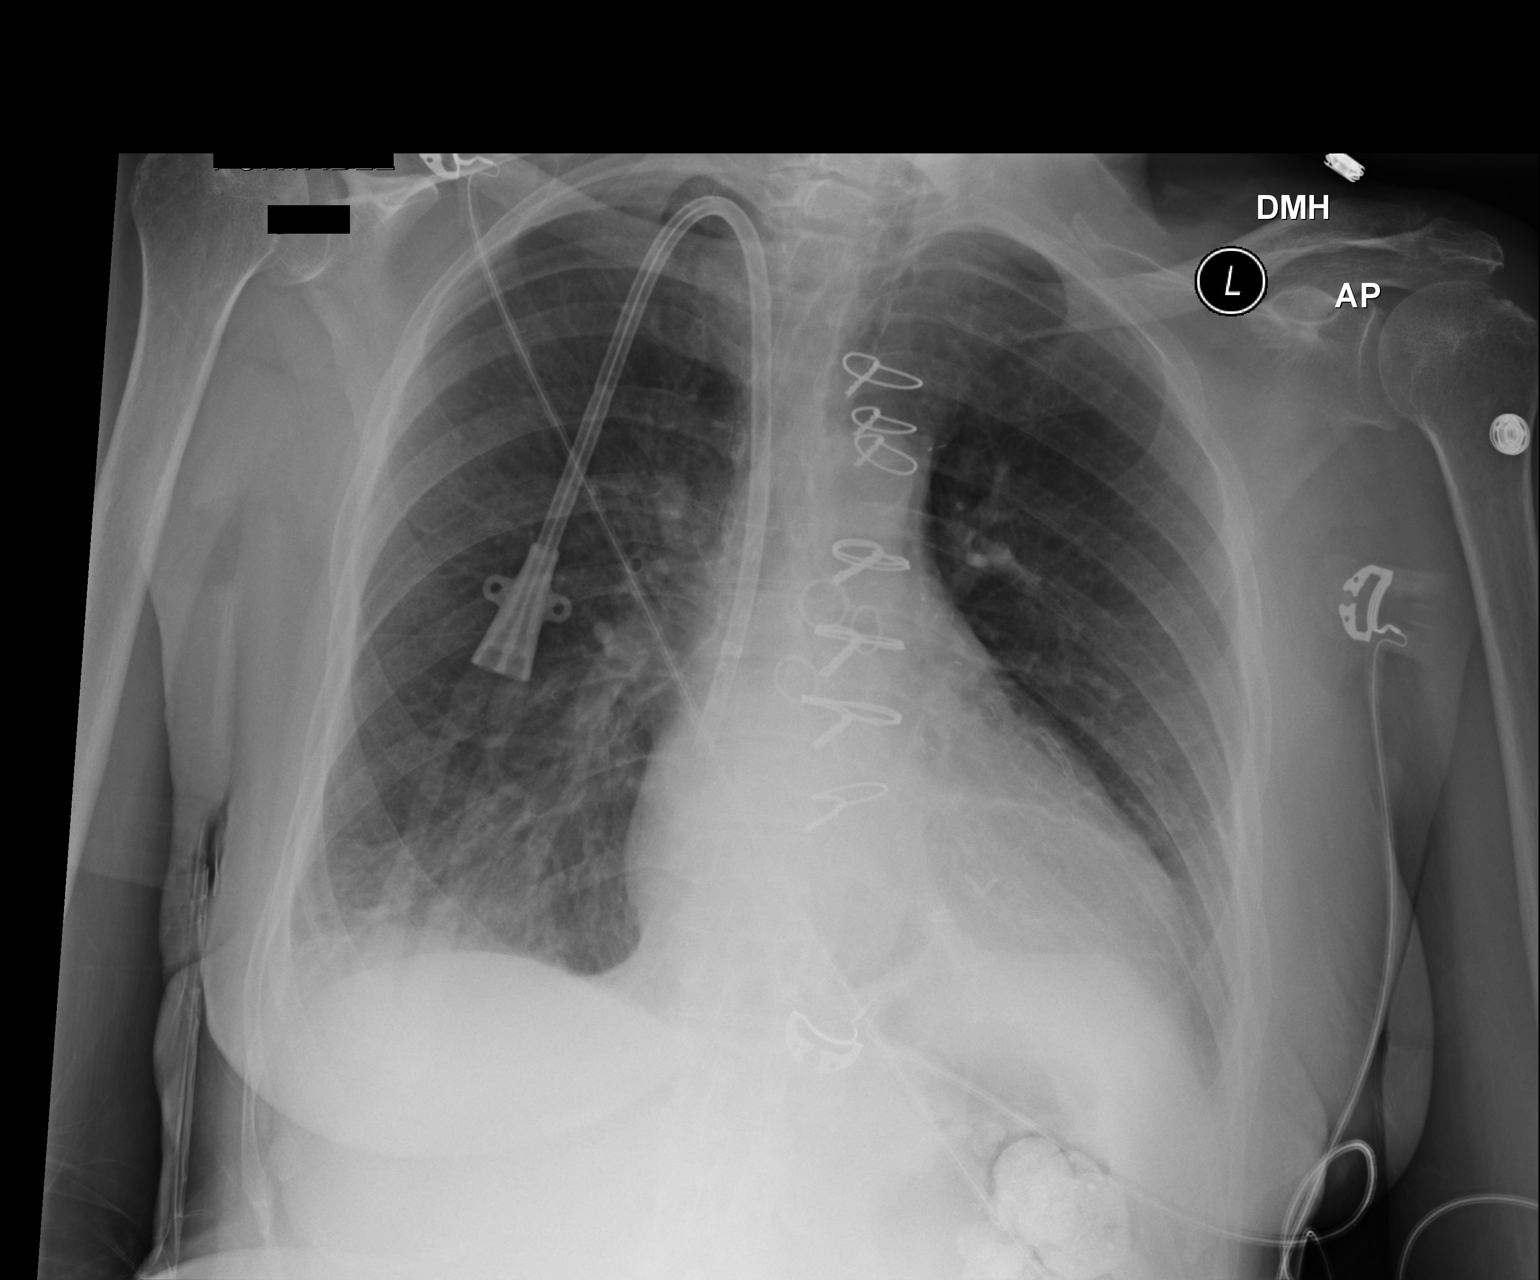

[1 of 1 positions shown; findings below may reference images not displayed]

FINDINGS: Cardiomegaly again noted. Status post CABG. Double lumen right IJ
catheter is unchanged in position. Improvement in aeration without
convincing pulmonary edema. Persistent trace bilateral pleural
effusion with bilateral basilar atelectasis or infiltrate.
IMPRESSION: Status post CABG. Double lumen right IJ catheter is unchanged in
position. Improvement in aeration without convincing pulmonary
edema. Persistent trace bilateral pleural effusion with bilateral
basilar atelectasis or infiltrate.

## 2016-10-12 MED ORDER — HEPARIN (PORCINE) IN NACL 100-0.45 UNIT/ML-% IJ SOLN
800.0000 [IU]/h | INTRAMUSCULAR | Status: DC
  Administered 2016-10-12: 800 [IU]/h via INTRAVENOUS
  Filled 2016-10-12: qty 250

## 2016-10-12 MED ORDER — SODIUM CHLORIDE 0.9 % IV SOLN: 250.0000 mL | INTRAVENOUS | Status: DC | PRN

## 2016-10-12 MED ORDER — SODIUM CHLORIDE 0.9% FLUSH: 3.0000 mL | INTRAVENOUS | Status: DC | PRN

## 2016-10-12 MED ORDER — DOXERCALCIFEROL 4 MCG/2ML IV SOLN
INTRAVENOUS | Status: AC
  Filled 2016-10-12: qty 4

## 2016-10-12 MED ORDER — SODIUM CHLORIDE 0.9% FLUSH
3.0000 mL | Freq: Two times a day (BID) | INTRAVENOUS | Status: DC
  Administered 2016-10-12 (×2): 3 mL via INTRAVENOUS

## 2016-10-12 MED ORDER — SODIUM CHLORIDE 0.9 % IV SOLN: Freq: Once | INTRAVENOUS | Status: DC

## 2016-10-12 NOTE — Progress Notes (Signed)
ANTICOAGULATION CONSULT NOTE - Follow Up Consult  Pharmacy Consult for Heparin Indication: chest pain/ACS  Patient Measurements: Height: 5\' 2"  (157.5 cm) Weight: 126 lb 5.2 oz (57.3 kg) IBW/kg (Calculated) : 50.1 Heparin Dosing Weight: 57 kg  Vital Signs: Temp: 98 F (36.7 C) (04/04 1842) Temp Source: Oral (04/04 1842) BP: 143/67 (04/04 2100) Pulse Rate: 84 (04/04 2100)  Labs:  Recent Labs  10/09/16 2325 10/10/16 0450 10/10/16 1513 10/11/16 0219 10/11/16 1056 10/12/16 0445 10/12/16 2030  HGB 11.7* 10.7*  --  8.9*  --  9.4*  --   HCT 37.0 34.2*  --  28.5*  --  29.4*  --   PLT 212 199  --  150  --  168  --   LABPROT 12.2  --   --   --   --   --   --   INR 0.91  --   --   --   --   --   --   HEPARINUNFRC  --   --   --   --   --   --  0.41  CREATININE 9.96* 10.41*  --  6.71*  --  5.22*  --   TROPONINI  --  9.32* 23.73*  --  13.05*  --   --     Estimated Creatinine Clearance: 7.3 mL/min (A) (by C-G formula based on SCr of 5.22 mg/dL (H)).  Assessment:  76 yr old female started on IV heparin this morning for ACS.  Initial heparin level is therapeutic (0.41) on 800 units/hr.  For cardiac cath in am.  Goal of Therapy:  Heparin level 0.3-0.7 units/ml Monitor platelets by anticoagulation protocol: Yes   Plan:   Continue heparin drip at 800 units/hr.  Next heparin level and CBC in am.  Follow up post-cath.  Arty Baumgartner, [place] Pager: [phone] 10/12/2016,9:34 PM

## 2016-10-12 NOTE — Progress Notes (Signed)
ANTICOAGULATION CONSULT NOTE - Follow Up Consult  Pharmacy Consult for Heparin Indication: chest pain/ACS  Allergies  Allergen Reactions  . Aspirin Other (See Comments)    High Doses Mess up her stomach; "makes my bowels have blood in them". Takes 81 mg EC Aspirin   . Penicillins Other (See Comments)    SYNCOPE? , "makes me real weak when I take it; like I'll pass out"  Has patient had a PCN reaction causing immediate rash, facial/tongue/throat swelling, SOB or lightheadedness with hypotension: no Has patient had a PCN reaction causing severe rash involving mucus membranes or skin necrosis: no Has patient had a PCN reaction that required hospitalization no Has patient had a PCN reaction occurring within the last 10 years: no If all of the above  . Bactrim [Sulfamethoxazole-Trimethoprim] Rash  . Contrast Media [Iodinated Diagnostic Agents] Itching  . Iron Itching and Other (See Comments)    "they gave me iron in dialysis; had to give me Benadryl cause I had to have the iron" (05/02/2012)  . Nitrofurantoin Hives  . Prilosec [Omeprazole] Other (See Comments)    "back spasms"  . Tylenol [Acetaminophen] Other (See Comments)    Makes her feet on fire per pt  . Dexilant [Dexlansoprazole] Other (See Comments)    Upset stomach  . Levaquin [Levofloxacin In D5w] Rash  . Morphine And Related Itching    Itching in feet  . Plavix [Clopidogrel Bisulfate] Rash  . Protonix [Pantoprazole Sodium] Rash  . Venofer [Ferric Oxide] Itching    Patient reports using Benadryl prior to doses as [facility]    Patient Measurements: Height: 5\' 2"  (157.5 cm) Weight: 128 lb 8.5 oz (58.3 kg) IBW/kg (Calculated) : 50.1  Vital Signs: Temp: 98.6 F (37 C) (04/04 0748) Temp Source: Oral (04/04 0748) BP: 185/70 (04/04 0900) Pulse Rate: 91 (04/04 0900)  Labs:  Recent Labs  10/09/16 2325 10/10/16 0450 10/10/16 1513 10/11/16 0219 10/11/16 1056 10/12/16 0445  HGB 11.7* 10.7*  --  8.9*  --  9.4*   HCT 37.0 34.2*  --  28.5*  --  29.4*  PLT 212 199  --  150  --  168  LABPROT 12.2  --   --   --   --   --   INR 0.91  --   --   --   --   --   CREATININE 9.96* 10.41*  --  6.71*  --  5.22*  TROPONINI  --  9.32* 23.73*  --  13.05*  --     Estimated Creatinine Clearance: 7.3 mL/min (A) (by C-G formula based on SCr of 5.22 mg/dL (H)).   Medications:  Infusions:  SQ Heparin - last dose received at 5am  Assessment: 76 year old female starting heparin for ACS. She received a dose of SQ Heparin for VTE prophylaxis at 5am so will defer giving a heparin bolus for now.  Her hemoglobin and platelet counts have been stable this admission with no bleeding noted.  Goal of Therapy:  Heparin level 0.3-0.7 units/ml Monitor platelets by anticoagulation protocol: Yes   Plan:  D/C SQ Heparin No Heparin bolus Start Heparin infusion at 800 units/hr Check heparin level in 8 hours Daily heparin level and CBC Monitor for bleeding complications  Legrand Como, Pharm.D., BCPS, AAHIVP Clinical Pharmacist Phone: [phone] or [phone] 10/12/2016, 10:45 AM

## 2016-10-12 NOTE — Progress Notes (Signed)
PROGRESS NOTE    VALENTINE KUECHLE  GYF:[id number] DOB: January 08, 1941 DOA: 10/09/2016 PCP: [name], MD   Brief Narrative: Shawna [name] is a 76 y.o. femalewith medical history significant of ESRD on HD (M/W/F), CHF, CAD s/p CABG, NSTEMI, HTN, GI bleed from AVM.   Assessment & Plan:   Principal Problem:   Hypertensive urgency Active Problems:   Atherosclerosis of native coronary artery of native heart without angina pectoris   ESRD on hemodialysis (HCC)   Non-STEMI (non-ST elevated myocardial infarction) (HCC)   Chronic diastolic CHF (congestive heart failure) (HCC)   Acute on chronic respiratory failure with hypoxia (HCC)   Fluid overload   Acute respiratory failure with hypoxemia Secondary to fluid overload from CHF exacerbation likely secondary to NSTEMI. Resolved.  Chest pain CAD s/p CABG and recent NSTEMI Recent cath in March 2018 significant for high grade stenosis of DES. Recurrent NSTEMI on this admission. -cardiology recommendations -trend troponin -continue nitroglycerin -continue aspirin -continue Lipitor  Sepsis Secondary to HCAP. Resolved. Blood cultures with no growth so far -blood cultures pending  HCAP Initially treated with Vancomycin and cefepime. Transitioned to doxycycline -continue doxycycline  Hypertensive emergency Patient initially with urgency with evidence of heart failure and NSTEMI. Blood pressure improved -continue Clonidine, coreg, hydralazine, amlodipine  ESRD on HD -nephrology recommendations  Paroxysmal atrial fibrillation -continue amiodarone  GERD -continue Protonix   DVT prophylaxis: Heparin subq Code Status: Full code Family Communication: None at bedside Disposition Plan: Discharge pending continued cardiac workup   Consultants:   Cardiology  Procedures:   None  Antimicrobials:  Vancomycin  Cefepime  Doxycycline    Subjective: No chest pain this morning. No dyspnea. She did have some chest pain  overnight that responded well to nitroglycerin.   Objective: Vitals:   10/11/16 2303 10/12/16 0000 10/12/16 0300 10/12/16 0524  BP: (!) 182/69 (!) 170/62  (!) 138/50  Pulse: (!) 104 90  76  Resp: (!) 21 16  15   Temp: 98.9 F (37.2 C)   98.2 F (36.8 C)  TempSrc: Oral   Oral  SpO2: 100% 99%  100%  Weight:   58.3 kg (128 lb 8.5 oz)   Height:        Intake/Output Summary (Last 24 hours) at 10/12/16 0724 Last data filed at 10/11/16 1853  Gross per 24 hour  Intake              600 ml  Output             2000 ml  Net            -1400 ml   Filed Weights   10/11/16 1302 10/11/16 1602 10/12/16 0300  Weight: 58.2 kg (128 lb 4.9 oz) 56 kg (123 lb 7.3 oz) 58.3 kg (128 lb 8.5 oz)    Examination:  General exam: Appears calm and comfortable Respiratory system: Clear to auscultation. Respiratory effort normal. Cardiovascular system: S1 & S2 heard, RRR. No murmurs. Gastrointestinal system: Abdomen is nondistended, soft and nontender. No organomegaly or masses felt. Normal bowel sounds heard. Central nervous system: Alert and oriented. No focal neurological deficits. Extremities: No edema. No calf tenderness Skin: No cyanosis. No rashes Psychiatry: Judgement and insight appear normal. Mood & affect appropriate.     Data Reviewed: I have personally reviewed following labs and imaging studies  CBC:  Recent Labs Lab 10/09/16 2325 10/10/16 0450 10/11/16 0219 10/12/16 0445  WBC 12.0* 12.5* 7.7 8.3  NEUTROABS 9.0*  --   --   --  HGB 11.7* 10.7* 8.9* 9.4*  HCT 37.0 34.2* 28.5* 29.4*  MCV 96.9 97.7 97.6 98.7  PLT 212 199 150 242   Basic Metabolic Panel:  Recent Labs Lab 10/09/16 2325 10/10/16 0450 10/11/16 0219 10/12/16 0445  NA 140 140 137 136  K 4.8 5.1 4.6 4.3  CL 96* 97* 97* 100*  CO2 24 26 28 24   GLUCOSE 139* 140* 105* 102*  BUN 54* 59* 30* 19  CREATININE 9.96* 10.41* 6.71* 5.22*  CALCIUM 9.7 9.2 9.3 9.3  MG  --   --   --  1.7   GFR: Estimated Creatinine  Clearance: 7.3 mL/min (A) (by C-G formula based on SCr of 5.22 mg/dL (H)). Liver Function Tests:  Recent Labs Lab 10/09/16 2325  AST 28  ALT 29  ALKPHOS 97  BILITOT 0.7  PROT 7.2  ALBUMIN 4.2   No results for input(s): LIPASE, AMYLASE in the last 168 hours. No results for input(s): AMMONIA in the last 168 hours. Coagulation Profile:  Recent Labs Lab 10/09/16 2325  INR 0.91   Cardiac Enzymes:  Recent Labs Lab 10/10/16 0450 10/10/16 1513 10/11/16 1056  TROPONINI 9.32* 23.73* 13.05*   BNP (last 3 results) No results for input(s): PROBNP in the last 8760 hours. HbA1C: No results for input(s): HGBA1C in the last 72 hours. CBG: No results for input(s): GLUCAP in the last 168 hours. Lipid Profile: No results for input(s): CHOL, HDL, LDLCALC, TRIG, CHOLHDL, LDLDIRECT in the last 72 hours. Thyroid Function Tests: No results for input(s): TSH, T4TOTAL, FREET4, T3FREE, THYROIDAB in the last 72 hours. Anemia Panel: No results for input(s): VITAMINB12, FOLATE, FERRITIN, TIBC, IRON, RETICCTPCT in the last 72 hours. Sepsis Labs:  Recent Labs Lab 10/09/16 2337 10/10/16 0450 10/10/16 0723  PROCALCITON  --  0.55  --   LATICACIDVEN 2.00* 2.2* 2.1*    Recent Results (from the past 240 hour(s))  Blood Culture (routine x 2)     Status: None (Preliminary result)   Collection Time: 10/10/16 12:44 AM  Result Value Ref Range Status   Specimen Description BLOOD LEFT ARM  Final   Special Requests   Final    BOTTLES DRAWN AEROBIC AND ANAEROBIC Blood Culture adequate volume   Culture NO GROWTH 1 DAY  Final   Report Status PENDING  Incomplete  Blood Culture (routine x 2)     Status: None (Preliminary result)   Collection Time: 10/10/16  1:24 AM  Result Value Ref Range Status   Specimen Description BLOOD RIGHT ARM  Final   Special Requests IN PEDIATRIC BOTTLE Blood Culture adequate volume  Final   Culture NO GROWTH 1 DAY  Final   Report Status PENDING  Incomplete          Radiology Studies: Dg Chest Port 1 View  Result Date: 10/10/2016 CLINICAL DATA:  Initial evaluation for pulmonary edema. Shortness of breath with chest pain. EXAM: PORTABLE CHEST 1 VIEW COMPARISON:  Prior radiograph from 10/09/2016. FINDINGS: A right-sided dual-lumen dialysis catheter in place, stable. Median sternotomy wires with underlying CABG markers and surgical clips unchanged. Stable cardiomegaly. Mediastinal silhouette within normal limits. Aortic atherosclerosis. Lungs normally inflated. Diffuse vascular congestion with scattered interstitial prominence in Kerley B-lines, compatible pulmonary edema, worse within the right lung. Small bilateral pleural effusions, left greater than right. No focal infiltrates. No pneumothorax. No acute osseous abnormality. IMPRESSION: 1. Cardiomegaly with mild diffuse pulmonary edema and small bilateral pleural effusions, consistent with acute CHF exacerbation. 2. Sequelae of prior CABG. 3. Aortic atherosclerosis.  Electronically Signed   By: Jeannine Boga M.D.   On: 10/10/2016 15:48        Scheduled Meds: . amiodarone  200 mg Oral Daily  . amLODipine  2.5 mg Oral QHS  . aspirin EC  81 mg Oral Daily  . atorvastatin  40 mg Oral q1800  . carvedilol  3.125 mg Oral BID WC  . cinacalcet  30 mg Oral Q supper  . cloNIDine  0.2 mg Oral BID  . doxercalciferol  7 mcg Intravenous Q M,W,F-HD  . doxycycline  100 mg Oral Q12H  . heparin  5,000 Units Subcutaneous Q8H  . hydrALAZINE  75 mg Oral Q8H  . isosorbide mononitrate  120 mg Oral Daily  . lanthanum  1,000 mg Oral TID PC  . multivitamin  1 tablet Oral QHS  . pantoprazole  40 mg Oral Daily  . sodium chloride flush  3 mL Intravenous Q12H   Continuous Infusions:   LOS: 2 days     Cordelia Poche, MD [facility] 10/12/2016, 7:24 AM Pager: [phone]  If 7PM-7AM, please contact night-coverage [email or web address] Password [email or web address] 10/12/2016, 7:24 AM

## 2016-10-12 NOTE — Progress Notes (Signed)
  Echocardiogram 2D Echocardiogram has been performed.  Shawna [name] 10/12/2016, 12:33 PM

## 2016-10-12 NOTE — Progress Notes (Signed)
[facility] Progress Note   Subjective: breathing better, 2L off w extra HD yest  Vitals:   10/12/16 0748 10/12/16 0800 10/12/16 0900 10/12/16 1100  BP: (!) 153/77 (!) 168/67 (!) 185/70 (!) 167/72  Pulse: 80 76 91 79  Resp: 18 14 (!) 21 17  Temp: 98.6 F (37 C)     TempSrc: Oral     SpO2: 97% 96% 97% 97%  Weight:      Height:        Inpatient medications: . sodium chloride   Intravenous Once  . amiodarone  200 mg Oral Daily  . amLODipine  2.5 mg Oral QHS  . aspirin EC  81 mg Oral Daily  . atorvastatin  40 mg Oral q1800  . carvedilol  3.125 mg Oral BID WC  . cinacalcet  30 mg Oral Q supper  . cloNIDine  0.2 mg Oral BID  . doxercalciferol  7 mcg Intravenous Q M,W,F-HD  . doxycycline  100 mg Oral Q12H  . hydrALAZINE  75 mg Oral Q8H  . isosorbide mononitrate  120 mg Oral Daily  . lanthanum  1,000 mg Oral TID PC  . multivitamin  1 tablet Oral QHS  . pantoprazole  40 mg Oral Daily  . sodium chloride flush  3 mL Intravenous Q12H  . sodium chloride flush  3 mL Intravenous Q12H   . heparin 800 Units/hr (10/12/16 1200)   sodium chloride, albuterol, ALPRAZolam, nitroGLYCERIN, ondansetron **OR** ondansetron (ZOFRAN) IV, sodium chloride flush  Exam: Alert,  No jvd Chest dec'd at bases, no rales, no ^wob RRR no RG ABd soft ntnd no ascites Trace LE edema R IJ cath , old AVF L arm  Dialysis: [facility] MWF   3h  2/2.5 bath  58kg   300/600   Hep 2000 then 200/hr - Hect 7ug - Needs benadryl 25 mg 25 minutes prior to giving venofer      Assessment: 1. Acute pulm edema/ resp failure - resp status to baseline.  2. ESRD MWF hd, using TDC, has failed LUA AVF, hx severe steal after RUE AVG, removed 3. HTN - on 5 BP meds 4. Volume - has lost body wt, will need lower dry wt 5. +troponin - w/u per cards, recent cath/NSTEMI 6. Anemia not on esa or Fe now 7. CKD/ MBD - cont meds 8. PAF - on amio, in NSR  Plan - HD today on sched, max UF, cont to lower volume  and lower dry wt    Kelly Splinter MD [facility] pager [phone]   10/12/2016, 1:08 PM    Recent Labs Lab 10/10/16 0450 10/11/16 0219 10/12/16 0445  NA 140 137 136  K 5.1 4.6 4.3  CL 97* 97* 100*  CO2 26 28 24   GLUCOSE 140* 105* 102*  BUN 59* 30* 19  CREATININE 10.41* 6.71* 5.22*  CALCIUM 9.2 9.3 9.3    Recent Labs Lab 10/09/16 2325  AST 28  ALT 29  ALKPHOS 97  BILITOT 0.7  PROT 7.2  ALBUMIN 4.2    Recent Labs Lab 10/09/16 2325 10/10/16 0450 10/11/16 0219 10/12/16 0445  WBC 12.0* 12.5* 7.7 8.3  NEUTROABS 9.0*  --   --   --   HGB 11.7* 10.7* 8.9* 9.4*  HCT 37.0 34.2* 28.5* 29.4*  MCV 96.9 97.7 97.6 98.7  PLT 212 199 150 168   Iron/TIBC/Ferritin/ %Sat    Component Value Date/Time   IRON 62 09/19/2016 2124   TIBC 230 (L) 09/19/2016 2124  FERRITIN 1,122 (H) 09/19/2016 2124   IRONPCTSAT 27 09/19/2016 2124

## 2016-10-12 NOTE — Progress Notes (Signed)
The patient has been seen in conjunction with Harlan Stains, PAC. All aspects of care have been considered and discussed. The patient has been personally interviewed, examined, and all clinical data has been reviewed.   Recurrent prolonged chest pain last night.  Echo result is still pending.  Need to proceed with repeat catheterization and possible PCI to better understand why the patient would continue to have recurring angina in absence of severe blood pressure elevation. Perhaps it is related to the diagonal which would need to be redilated. Not sure it could be restented because of size. Rule out plaque rupture in other territories that has developed in the time frame since cath 3 weeks ago.  We will setup the patient for coronary angiography tomorrow a.m. IV heparin until then.  Discussed this with the patient early this morning including the risk of stroke, death, myocardial infarction, bleeding, emergency bypass surgery, infection    Progress Note  Patient Name: Shawna [name] Date of Encounter: 10/12/2016  Primary Cardiologist: [place] better this morning, weaned from O2. Did have brief episode of chest pain with elevated blood pressure and cough last night. Resolved with SL nitro.  Inpatient Medications    Scheduled Meds: . amiodarone  200 mg Oral Daily  . amLODipine  2.5 mg Oral QHS  . aspirin EC  81 mg Oral Daily  . atorvastatin  40 mg Oral q1800  . carvedilol  3.125 mg Oral BID WC  . cinacalcet  30 mg Oral Q supper  . cloNIDine  0.2 mg Oral BID  . doxercalciferol  7 mcg Intravenous Q M,W,F-HD  . doxycycline  100 mg Oral Q12H  . heparin  5,000 Units Subcutaneous Q8H  . hydrALAZINE  75 mg Oral Q8H  . isosorbide mononitrate  120 mg Oral Daily  . lanthanum  1,000 mg Oral TID PC  . multivitamin  1 tablet Oral QHS  . pantoprazole  40 mg Oral Daily  . sodium chloride flush  3 mL Intravenous Q12H   Continuous Infusions:  PRN Meds: albuterol,  ALPRAZolam, nitroGLYCERIN, ondansetron **OR** ondansetron (ZOFRAN) IV   Vital Signs    Vitals:   10/12/16 0524 10/12/16 0748 10/12/16 0800 10/12/16 0900  BP: (!) 138/50 (!) 153/77 (!) 168/67 (!) 185/70  Pulse: 76 80 76 91  Resp: 15 18 14  (!) 21  Temp: 98.2 F (36.8 C) 98.6 F (37 C)    TempSrc: Oral Oral    SpO2: 100% 97% 96% 97%  Weight:      Height:        Intake/Output Summary (Last 24 hours) at 10/12/16 1004 Last data filed at 10/12/16 0800  Gross per 24 hour  Intake              600 ml  Output             2000 ml  Net            -1400 ml   Filed Weights   10/11/16 1302 10/11/16 1602 10/12/16 0300  Weight: 128 lb 4.9 oz (58.2 kg) 123 lb 7.3 oz (56 kg) 128 lb 8.5 oz (58.3 kg)    Telemetry    SR - Personally Reviewed  ECG    SR with LVH - Personally Reviewed  Physical Exam   General: Well developed, well nourished, older AA female appearing in no acute distress. Head: Normocephalic, atraumatic.  Neck: Supple without bruits, JVD. Lungs:  Resp regular and unlabored, Diminished  lower lobes. Heart: RRR, S1, S2, no S3, S4, or murmur; no rub. Abdomen: Soft, non-tender, non-distended with normoactive bowel sounds. No hepatomegaly. No rebound/guarding. No obvious abdominal masses. Extremities: No clubbing, cyanosis, trace ankle edema. Distal pedal pulses are 2+ bilaterally.  LUA fistula, HD cath right chest. Neuro: Alert and oriented X 3. Moves all extremities spontaneously. Psych: Normal affect.  Labs    Chemistry Recent Labs Lab 10/09/16 2325 10/10/16 0450 10/11/16 0219 10/12/16 0445  NA 140 140 137 136  K 4.8 5.1 4.6 4.3  CL 96* 97* 97* 100*  CO2 24 26 28 24   GLUCOSE 139* 140* 105* 102*  BUN 54* 59* 30* 19  CREATININE 9.96* 10.41* 6.71* 5.22*  CALCIUM 9.7 9.2 9.3 9.3  PROT 7.2  --   --   --   ALBUMIN 4.2  --   --   --   AST 28  --   --   --   ALT 29  --   --   --   ALKPHOS 97  --   --   --   BILITOT 0.7  --   --   --   GFRNONAA 3* 3* 5* 7*  GFRAA  4* 4* 6* 8*  ANIONGAP 20* 17* 12 12     Hematology Recent Labs Lab 10/10/16 0450 10/11/16 0219 10/12/16 0445  WBC 12.5* 7.7 8.3  RBC 3.50* 2.92* 2.98*  HGB 10.7* 8.9* 9.4*  HCT 34.2* 28.5* 29.4*  MCV 97.7 97.6 98.7  MCH 30.6 30.5 31.5  MCHC 31.3 31.2 32.0  RDW 17.8* 17.4* 17.6*  PLT 199 150 168    Cardiac Enzymes Recent Labs Lab 10/10/16 0450 10/10/16 1513 10/11/16 1056  TROPONINI 9.32* 23.73* 13.05*    Recent Labs Lab 10/09/16 2335  TROPIPOC 0.06     BNPNo results for input(s): BNP, PROBNP in the last 168 hours.   DDimer No results for input(s): DDIMER in the last 168 hours.    Radiology    Dg Chest Port 1 View  Result Date: 10/12/2016 CLINICAL DATA:  Pulmonary edema, chest pain last night EXAM: PORTABLE CHEST 1 VIEW COMPARISON:  10/10/2016 FINDINGS: Cardiomegaly again noted. Status post CABG. Double lumen right IJ catheter is unchanged in position. Improvement in aeration without convincing pulmonary edema. Persistent trace bilateral pleural effusion with bilateral basilar atelectasis or infiltrate. IMPRESSION: Status post CABG. Double lumen right IJ catheter is unchanged in position. Improvement in aeration without convincing pulmonary edema. Persistent trace bilateral pleural effusion with bilateral basilar atelectasis or infiltrate. Electronically Signed   By: Lahoma Crocker M.D.   On: 10/12/2016 08:50   Dg Chest Port 1 View  Result Date: 10/10/2016 CLINICAL DATA:  Initial evaluation for pulmonary edema. Shortness of breath with chest pain. EXAM: PORTABLE CHEST 1 VIEW COMPARISON:  Prior radiograph from 10/09/2016. FINDINGS: A right-sided dual-lumen dialysis catheter in place, stable. Median sternotomy wires with underlying CABG markers and surgical clips unchanged. Stable cardiomegaly. Mediastinal silhouette within normal limits. Aortic atherosclerosis. Lungs normally inflated. Diffuse vascular congestion with scattered interstitial prominence in Kerley B-lines,  compatible pulmonary edema, worse within the right lung. Small bilateral pleural effusions, left greater than right. No focal infiltrates. No pneumothorax. No acute osseous abnormality. IMPRESSION: 1. Cardiomegaly with mild diffuse pulmonary edema and small bilateral pleural effusions, consistent with acute CHF exacerbation. 2. Sequelae of prior CABG. 3. Aortic atherosclerosis. Electronically Signed   By: Jeannine Boga M.D.   On: 10/10/2016 15:48    Cardiac Studies   TTE: pending  Patient Profile     76 y.o. female PMH of CAD s/p CABG with recent NSTEMI, HTN, GI bleed from AVM and ESRD on HD who presented with chest pain and elevated troponin.  Assessment & Plan    1. Chest pain:Sudden onset with dyspnea. Similar to previous pain with her last cath. This was in the setting of volume overload, and sepsis 2/2 PNA. Last cath noted diffuse disease with patent grafts. Small caliber vessel first diag disease noted, but felt to be high risk for PCI and her allergies. Treated medically.  -- Trop peak at 23 this admission in the setting of acute illness and volume overload, now trending down. EKG showed diffuse ST depressions with elevated rate.   -- had an episode of chest pain last night while coughing, and elevated blood pressure. Relieved by SL nitro. Discuss with MD. -- echo still pending. Called to ask that this be done soon.   2. DCV:UDTHYHOOI hypertensive on admission and required nitro gtt. Blood pressure has since improve, with medications and HD. Continues to have intermittent episodes of hypertension, but also lows.  -- will continue with current regimen as dialysis is still being adjusted.  3. ESRD on LN:[id number] by nephrology, had HD again yesterday.      4. SIRS 2/2 HCAP: sepsis now resolved, primary managing  5. Acute on Chronic diastolic HF: Has now weaned from O2, breathing much improved.  -- Volume management via HD  Signed, Reino Bellis, NP  10/12/2016, 10:04 AM

## 2016-10-12 NOTE — Progress Notes (Signed)
PT Cancellation Note  Patient Details Name: Shawna [name] MRN: [id number] DOB: 23-Jul-1940   Cancelled Treatment:    Reason Eval/Treat Not Completed: Patient at procedure or test/unavailable;Patient not medically ready, currently with ECHO and patient also noted to be having another heart cath next day per nsg, will hold therapy at this time.   Duncan Dull 10/12/2016, 12:21 PM

## 2016-10-12 NOTE — Progress Notes (Signed)
Patient call to desk c/o 6/10 chest pain radiating to back, 2 sl Ntg tabs given EKG done, MD notified Patient states pain improved Pt BP 149/63 HR 85 Pt resting now MD states continue to monitor. Pt with no acute complaints at this time.

## 2016-10-13 ENCOUNTER — Encounter (HOSPITAL_COMMUNITY): Payer: Self-pay | Admitting: Cardiovascular Disease

## 2016-10-13 ENCOUNTER — Encounter (HOSPITAL_COMMUNITY)
Admission: EM | Disposition: A | Discharge status: Home / Self Care | Payer: Self-pay | Source: Home / Self Care | Attending: Family Medicine

## 2016-10-13 ENCOUNTER — Other Ambulatory Visit: Payer: Self-pay

## 2016-10-13 DIAGNOSIS — I2510 Atherosclerotic heart disease of native coronary artery without angina pectoris: Secondary | ICD-10-CM

## 2016-10-13 HISTORY — PX: CORONARY STENT INTERVENTION: CATH118234

## 2016-10-13 HISTORY — PX: LEFT HEART CATH AND CORS/GRAFTS ANGIOGRAPHY: CATH118250

## 2016-10-13 LAB — POCT ACTIVATED CLOTTING TIME: Activated Clotting Time: 301 seconds

## 2016-10-13 LAB — CBC
HEMATOCRIT: 30.3 % — AB (ref 36.0–46.0)
HEMOGLOBIN: 9.3 g/dL — AB (ref 12.0–15.0)
MCH: 30 pg (ref 26.0–34.0)
MCHC: 30.7 g/dL (ref 30.0–36.0)
MCV: 97.7 fL (ref 78.0–100.0)
Platelets: 171 10*3/uL (ref 150–400)
RBC: 3.1 MIL/uL — AB (ref 3.87–5.11)
RDW: 17 % — ABNORMAL HIGH (ref 11.5–15.5)
WBC: 8.1 10*3/uL (ref 4.0–10.5)

## 2016-10-13 LAB — HEPARIN LEVEL (UNFRACTIONATED): HEPARIN UNFRACTIONATED: 0.48 [IU]/mL (ref 0.30–0.70)

## 2016-10-13 SURGERY — LEFT HEART CATH AND CORS/GRAFTS ANGIOGRAPHY
Anesthesia: LOCAL

## 2016-10-13 MED ORDER — LIDOCAINE HCL (PF) 1 % IJ SOLN
INTRAMUSCULAR | Status: AC
  Filled 2016-10-13: qty 30

## 2016-10-13 MED ORDER — SODIUM CHLORIDE 0.9 % IV SOLN: 250.0000 mL | INTRAVENOUS | Status: DC | PRN

## 2016-10-13 MED ORDER — FAMOTIDINE IN NACL 20-0.9 MG/50ML-% IV SOLN
20.0000 mg | Freq: Once | INTRAVENOUS | Status: AC
  Administered 2016-10-13: 20 mg via INTRAVENOUS
  Filled 2016-10-13: qty 50

## 2016-10-13 MED ORDER — ASPIRIN 81 MG PO CHEW
81.0000 mg | CHEWABLE_TABLET | Freq: Every day | ORAL | Status: DC
  Administered 2016-10-14: 81 mg via ORAL
  Filled 2016-10-13: qty 1

## 2016-10-13 MED ORDER — DIPHENHYDRAMINE HCL 50 MG/ML IJ SOLN
INTRAMUSCULAR | Status: AC
  Administered 2016-10-13: 25 mg
  Filled 2016-10-13: qty 1

## 2016-10-13 MED ORDER — ONDANSETRON HCL 4 MG/2ML IJ SOLN: 4.0000 mg | Freq: Four times a day (QID) | INTRAMUSCULAR | Status: DC | PRN

## 2016-10-13 MED ORDER — TICAGRELOR 90 MG PO TABS
90.0000 mg | ORAL_TABLET | Freq: Two times a day (BID) | ORAL | Status: DC
  Administered 2016-10-13 – 2016-10-14 (×2): 90 mg via ORAL
  Filled 2016-10-13 (×2): qty 1

## 2016-10-13 MED ORDER — NITROGLYCERIN 1 MG/10 ML FOR IR/CATH LAB
INTRA_ARTERIAL | Status: AC
  Filled 2016-10-13: qty 10

## 2016-10-13 MED ORDER — TICAGRELOR 90 MG PO TABS
ORAL_TABLET | ORAL | Status: AC
  Filled 2016-10-13: qty 1

## 2016-10-13 MED ORDER — HEPARIN (PORCINE) IN NACL 2-0.9 UNIT/ML-% IJ SOLN
INTRAMUSCULAR | Status: AC
  Filled 2016-10-13: qty 1000

## 2016-10-13 MED ORDER — ACETAMINOPHEN 325 MG PO TABS: 650.0000 mg | ORAL_TABLET | ORAL | Status: DC | PRN

## 2016-10-13 MED ORDER — MIDAZOLAM HCL 2 MG/2ML IJ SOLN
INTRAMUSCULAR | Status: DC | PRN
  Administered 2016-10-13: 1 mg via INTRAVENOUS

## 2016-10-13 MED ORDER — METHYLPREDNISOLONE SODIUM SUCC 125 MG IJ SOLR
INTRAMUSCULAR | Status: AC
  Filled 2016-10-13: qty 2

## 2016-10-13 MED ORDER — SODIUM CHLORIDE 0.9% FLUSH: 3.0000 mL | INTRAVENOUS | Status: DC | PRN

## 2016-10-13 MED ORDER — BIVALIRUDIN 250 MG IV SOLR
INTRAVENOUS | Status: AC
  Filled 2016-10-13: qty 250

## 2016-10-13 MED ORDER — NITROGLYCERIN 1 MG/10 ML FOR IR/CATH LAB
INTRA_ARTERIAL | Status: DC | PRN
  Administered 2016-10-13 (×4): 200 ug via INTRACORONARY

## 2016-10-13 MED ORDER — ANGIOPLASTY BOOK
Freq: Once | Status: AC
  Administered 2016-10-13: 20:00:00
  Filled 2016-10-13: qty 1

## 2016-10-13 MED ORDER — HYDRALAZINE HCL 20 MG/ML IJ SOLN: 5.0000 mg | INTRAMUSCULAR | Status: DC | PRN

## 2016-10-13 MED ORDER — SODIUM CHLORIDE 0.9% FLUSH
3.0000 mL | Freq: Two times a day (BID) | INTRAVENOUS | Status: DC
  Administered 2016-10-13 (×2): 3 mL via INTRAVENOUS

## 2016-10-13 MED ORDER — FENTANYL CITRATE (PF) 100 MCG/2ML IJ SOLN
INTRAMUSCULAR | Status: DC | PRN
  Administered 2016-10-13: 25 ug via INTRAVENOUS

## 2016-10-13 MED ORDER — TICAGRELOR 90 MG PO TABS
ORAL_TABLET | ORAL | Status: DC | PRN
  Administered 2016-10-13: 180 mg via ORAL

## 2016-10-13 MED ORDER — HEPARIN (PORCINE) IN NACL 2-0.9 UNIT/ML-% IJ SOLN
INTRAMUSCULAR | Status: DC | PRN
  Administered 2016-10-13: 1000 mL

## 2016-10-13 MED ORDER — ASPIRIN 81 MG PO CHEW
CHEWABLE_TABLET | ORAL | Status: DC | PRN
  Administered 2016-10-13: 81 mg via ORAL

## 2016-10-13 MED ORDER — FENTANYL CITRATE (PF) 100 MCG/2ML IJ SOLN
INTRAMUSCULAR | Status: AC
  Filled 2016-10-13: qty 2

## 2016-10-13 MED ORDER — IOPAMIDOL (ISOVUE-370) INJECTION 76%
INTRAVENOUS | Status: DC | PRN
  Administered 2016-10-13: 205 mL via INTRA_ARTERIAL

## 2016-10-13 MED ORDER — IOPAMIDOL (ISOVUE-370) INJECTION 76%
INTRAVENOUS | Status: AC
  Filled 2016-10-13: qty 125

## 2016-10-13 MED ORDER — METHYLPREDNISOLONE SODIUM SUCC 125 MG IJ SOLR
125.0000 mg | Freq: Once | INTRAMUSCULAR | Status: AC
  Administered 2016-10-13: 125 mg via INTRAVENOUS

## 2016-10-13 MED ORDER — SODIUM CHLORIDE 0.9 % IV SOLN
INTRAVENOUS | Status: DC | PRN
  Administered 2016-10-13: 0.25 mg/kg/h via INTRAVENOUS

## 2016-10-13 MED ORDER — ASPIRIN 81 MG PO CHEW
CHEWABLE_TABLET | ORAL | Status: AC
  Filled 2016-10-13: qty 1

## 2016-10-13 MED ORDER — ATORVASTATIN CALCIUM 80 MG PO TABS
80.0000 mg | ORAL_TABLET | Freq: Every day | ORAL | Status: DC
  Administered 2016-10-13 – 2016-10-14 (×2): 80 mg via ORAL
  Filled 2016-10-13 (×2): qty 1

## 2016-10-13 MED ORDER — HEART ATTACK BOUNCING BOOK
Freq: Once | Status: AC
  Administered 2016-10-13: 20:00:00
  Filled 2016-10-13: qty 1

## 2016-10-13 MED ORDER — SENNOSIDES-DOCUSATE SODIUM 8.6-50 MG PO TABS
1.0000 | ORAL_TABLET | Freq: Every evening | ORAL | Status: DC | PRN
  Administered 2016-10-13: 1 via ORAL
  Filled 2016-10-13: qty 1

## 2016-10-13 MED ORDER — LABETALOL HCL 5 MG/ML IV SOLN: 10.0000 mg | INTRAVENOUS | Status: DC | PRN

## 2016-10-13 MED ORDER — MIDAZOLAM HCL 2 MG/2ML IJ SOLN
INTRAMUSCULAR | Status: AC
  Filled 2016-10-13: qty 2

## 2016-10-13 MED ORDER — BIVALIRUDIN BOLUS VIA INFUSION - CUPID
INTRAVENOUS | Status: DC | PRN
  Administered 2016-10-13: 41.475 mg via INTRAVENOUS

## 2016-10-13 MED ORDER — DIPHENHYDRAMINE HCL 50 MG/ML IJ SOLN
25.0000 mg | Freq: Once | INTRAMUSCULAR | Status: AC
  Administered 2016-10-13: 25 mg via INTRAVENOUS

## 2016-10-13 MED ORDER — LIDOCAINE HCL (PF) 1 % IJ SOLN
INTRAMUSCULAR | Status: DC | PRN
  Administered 2016-10-13: 15 mL
  Administered 2016-10-13: 10 mL

## 2016-10-13 SURGICAL SUPPLY — 16 items
BALLN EUPHORA RX 2.0X10 (BALLOONS) ×2
BALLN ~~LOC~~ MOZEC 2.5X15 (BALLOONS) ×2
BALLOON EUPHORA RX 2.0X10 (BALLOONS) ×1 IMPLANT
BALLOON ~~LOC~~ MOZEC 2.5X15 (BALLOONS) ×1 IMPLANT
CATH INFINITI 5FR MULTPACK ANG (CATHETERS) ×2 IMPLANT
CATH VISTA GUIDE 6FR XBLAD3.5 (CATHETERS) ×2 IMPLANT
KIT ENCORE 26 ADVANTAGE (KITS) ×2 IMPLANT
KIT HEART LEFT (KITS) ×2 IMPLANT
PACK CARDIAC CATHETERIZATION (CUSTOM PROCEDURE TRAY) ×2 IMPLANT
SHEATH PINNACLE 5F 10CM (SHEATH) ×4 IMPLANT
SHEATH PINNACLE 6F 10CM (SHEATH) ×2 IMPLANT
STENT RESOLUTE ONYX 2.25X15 (Permanent Stent) ×2 IMPLANT
TRANSDUCER W/STOPCOCK (MISCELLANEOUS) ×2 IMPLANT
TUBING CIL FLEX 10 FLL-RA (TUBING) ×2 IMPLANT
WIRE EMERALD 3MM-J .035X150CM (WIRE) ×2 IMPLANT
WIRE PT2 MS 185 (WIRE) ×2 IMPLANT

## 2016-10-13 NOTE — Care Management Note (Addendum)
Case Management Note  Patient Details  Name: Shawna [name] MRN: [id number] Date of Birth: 28-Jan-1941  Subjective/Objective:   From home with spouse,  Uses a cane at home , he PCP is Dr. Nadara Mustard, she has medication coverage and transportation at dc.  s/p coronary stent intervention, will be on brilinta , NCM gave her the 30 day savings card, she will go to [facility] in [place] to get 30 day free and they have in stock.  Patient has had HHPT with [facility] before but her insurance would not give them any more approved visits.  She is a HD patient - M,W, F,  NCM will cont to follow for dc needs.                Action/Plan:   Expected Discharge Date:                  Expected Discharge Plan:  Home/Self Care  In-House Referral:     Discharge planning Services  CM Consult  Post Acute Care Choice:    Choice offered to:     DME Arranged:    DME Agency:     HH Arranged:    HH Agency:     Status of Service:  In process, will continue to follow  If discussed at Long Length of Stay Meetings, dates discussed:    Additional Comments:  Zenon Mayo, RN 10/13/2016, 2:13 PM

## 2016-10-13 NOTE — Progress Notes (Signed)
PT Cancellation Note  Patient Details Name: Shawna [name] MRN: [id number] DOB: 1941/01/26   Cancelled Treatment:    Reason Eval/Treat Not Completed: Patient at procedure or test/unavailable. Pt in cath lab.   Leon 10/13/2016, 8:45 AM [place]

## 2016-10-13 NOTE — Interval H&P Note (Signed)
Cath Lab Visit (complete for each Cath Lab visit)  Clinical Evaluation Leading to the Procedure:   ACS: Yes.    Non-ACS:    Anginal Classification: CCS IV  Anti-ischemic medical therapy: Maximal Therapy (2 or more classes of medications)  Non-Invasive Test Results: No non-invasive testing performed  Prior CABG: Previous CABG      History and Physical Interval Note:  10/13/2016 7:43 AM  Shawna [name]  has presented today for surgery, with the diagnosis of cp  The various methods of treatment have been discussed with the patient and family. After consideration of risks, benefits and other options for treatment, the patient has consented to  Procedure(s): Left Heart Cath and Cors/Grafts Angiography (N/A) as a surgical intervention .  The patient's history has been reviewed, patient examined, no change in status, stable for surgery.  I have reviewed the patient's chart and labs.  Questions were answered to the patient's satisfaction.     Shelva Majestic

## 2016-10-13 NOTE — Progress Notes (Signed)
Progress Note  Patient Name: Shawna [name] Date of Encounter: 10/13/2016  Primary Cardiologist: Bronson Ing  Subjective   Quiet night. No recurrent chest pain. Echo demonstrated preserved LV function suggesting that no new significant abnormality has occurred but that the diagonal is still the culprit.  Inpatient Medications    Scheduled Meds: . sodium chloride   Intravenous Once  . [MAR Hold] amiodarone  200 mg Oral Daily  . [MAR Hold] amLODipine  2.5 mg Oral QHS  . [MAR Hold] aspirin EC  81 mg Oral Daily  . [MAR Hold] atorvastatin  40 mg Oral q1800  . [MAR Hold] carvedilol  3.125 mg Oral BID WC  . [MAR Hold] cinacalcet  30 mg Oral Q supper  . [MAR Hold] cloNIDine  0.2 mg Oral BID  . [MAR Hold] doxercalciferol  7 mcg Intravenous Q M,W,F-HD  . [MAR Hold] doxycycline  100 mg Oral Q12H  . [MAR Hold] hydrALAZINE  75 mg Oral Q8H  . [MAR Hold] isosorbide mononitrate  120 mg Oral Daily  . [MAR Hold] lanthanum  1,000 mg Oral TID PC  . [MAR Hold] multivitamin  1 tablet Oral QHS  . [MAR Hold] pantoprazole  40 mg Oral Daily  . [MAR Hold] sodium chloride flush  3 mL Intravenous Q12H  . sodium chloride flush  3 mL Intravenous Q12H   Continuous Infusions: . bivalirudin (ANGIOMAX) infusion 5 mg/mL 0.25 mg/kg/hr (10/13/16 0830)  . heparin 800 Units/hr (10/12/16 1900)  . heparin     PRN Meds: sodium chloride, [MAR Hold] albuterol, [MAR Hold] ALPRAZolam, bivalirudin (ANGIOMAX) infusion 5 mg/mL, bivalirudin, fentaNYL, heparin, lidocaine (PF), midazolam, [MAR Hold] nitroGLYCERIN, nitroGLYCERIN, [MAR Hold] ondansetron **OR** [MAR Hold] ondansetron (ZOFRAN) IV, sodium chloride flush, ticagrelor   Vital Signs    Vitals:   10/12/16 2142 10/12/16 2218 10/13/16 0530 10/13/16 0600  BP: (!) 160/68 (!) 201/72 (!) 120/43 (!) 107/37  Pulse: 82 85 66 64  Resp: 20 18 11  (!) 22  Temp: 98.4 F (36.9 C) 97.6 F (36.4 C) 98.7 F (37.1 C)   TempSrc: Oral Oral Oral   SpO2: 98% 96% 98% 98%  Weight: 121  lb 14.6 oz (55.3 kg)     Height:        Intake/Output Summary (Last 24 hours) at 10/13/16 0848 Last data filed at 10/13/16 0600  Gross per 24 hour  Intake            565.2 ml  Output             2000 ml  Net          -1434.8 ml   Filed Weights   10/12/16 0300 10/12/16 1842 10/12/16 2142  Weight: 128 lb 8.5 oz (58.3 kg) 126 lb 5.2 oz (57.3 kg) 121 lb 14.6 oz (55.3 kg)    Telemetry    NSR - Personally Reviewed  ECG    LVH with strain - Personally Reviewed  Physical Exam  No distress. GEN: No acute distress.   Neck: No JVD Cardiac: RRR, no murmurs, rubs, but positive for S4 gallop.  Respiratory: Clear to auscultation bilaterally. GI: Soft, nontender, non-distended  MS: No edema; No deformity. Neuro:  Nonfocal  Psych: Normal affect   Labs    Chemistry Recent Labs Lab 10/09/16 2325 10/10/16 0450 10/11/16 0219 10/12/16 0445  NA 140 140 137 136  K 4.8 5.1 4.6 4.3  CL 96* 97* 97* 100*  CO2 24 26 28 24   GLUCOSE 139* 140* 105* 102*  BUN 54* 59* 30* 19  CREATININE 9.96* 10.41* 6.71* 5.22*  CALCIUM 9.7 9.2 9.3 9.3  PROT 7.2  --   --   --   ALBUMIN 4.2  --   --   --   AST 28  --   --   --   ALT 29  --   --   --   ALKPHOS 97  --   --   --   BILITOT 0.7  --   --   --   GFRNONAA 3* 3* 5* 7*  GFRAA 4* 4* 6* 8*  ANIONGAP 20* 17* 12 12     Hematology Recent Labs Lab 10/11/16 0219 10/12/16 0445 10/13/16 0238  WBC 7.7 8.3 8.1  RBC 2.92* 2.98* 3.10*  HGB 8.9* 9.4* 9.3*  HCT 28.5* 29.4* 30.3*  MCV 97.6 98.7 97.7  MCH 30.5 31.5 30.0  MCHC 31.2 32.0 30.7  RDW 17.4* 17.6* 17.0*  PLT 150 168 171    Cardiac Enzymes Recent Labs Lab 10/10/16 0450 10/10/16 1513 10/11/16 1056  TROPONINI 9.32* 23.73* 13.05*    Recent Labs Lab 10/09/16 2335  TROPIPOC 0.06     BNPNo results for input(s): BNP, PROBNP in the last 168 hours.   DDimer No results for input(s): DDIMER in the last 168 hours.   Radiology    Dg Chest Port 1 View  Result Date:  10/12/2016 CLINICAL DATA:  Pulmonary edema, chest pain last night EXAM: PORTABLE CHEST 1 VIEW COMPARISON:  10/10/2016 FINDINGS: Cardiomegaly again noted. Status post CABG. Double lumen right IJ catheter is unchanged in position. Improvement in aeration without convincing pulmonary edema. Persistent trace bilateral pleural effusion with bilateral basilar atelectasis or infiltrate. IMPRESSION: Status post CABG. Double lumen right IJ catheter is unchanged in position. Improvement in aeration without convincing pulmonary edema. Persistent trace bilateral pleural effusion with bilateral basilar atelectasis or infiltrate. Electronically Signed   By: Lahoma Crocker M.D.   On: 10/12/2016 08:50    Cardiac Studies  Echocardiogram 10/12/16: Study Conclusions  - Left ventricle: The cavity size was normal. There was moderate   concentric hypertrophy. Systolic function was normal. The   estimated ejection fraction was in the range of 50% to 55%. Wall   motion was normal; there were no regional wall motion   abnormalities. Doppler parameters are consistent with abnormal   left ventricular relaxation (grade 1 diastolic dysfunction).   Doppler parameters are consistent with elevated ventricular   end-diastolic filling pressure. - Aortic valve: Trileaflet; normal thickness leaflets. There was no   regurgitation. - Aortic root: The aortic root was normal in size. - Mitral valve: Calcified annulus. Mildly thickened leaflets .   There was mild regurgitation. - Left atrium: The atrium was normal in size. - Right ventricle: Systolic function was normal. - Tricuspid valve: There was no regurgitation. - Pulmonary arteries: Systolic pressure could not be accurately   estimated. - Inferior vena cava: The vessel was normal in size. - Pericardium, extracardiac: There was no pericardial effusion.  Patient Profile     76 y.o. female female PMH of CAD s/p CABG with recent NSTEMI, HTN, GI bleed from AVM and ESRD on HD who  presented with chest pain and elevated troponin.  Assessment & Plan    1. CAD, previous CABG, and known in-stent restenosis of a moderate size diagonal. Repeat presentation with non-ST elevation, now suspected to be related to residual diagonal noted at the time of previous cardiac catheterization one month ago. Plan is for coronary  angiography and possible PCI today   Signed, Sinclair Grooms, MD  10/13/2016, 8:48 AM

## 2016-10-13 NOTE — Progress Notes (Signed)
Site area: right groin  Site Prior to Removal:  Level 0  Pressure Applied For 25 MINUTES    Minutes Beginning at 1415  Manual:   Yes.    Patient Status During Pull:  Stable   Post Pull Groin Site:  Level 0  Post Pull Instructions Given:  Yes.    Post Pull Pulses Present:  Yes.    Dressing Applied:  Yes.    Comments:  Rechecked at 1445, 1500 and 1515 with no change in assessment, dressing dry and intact, pedal pulse palpable +2.

## 2016-10-13 NOTE — Progress Notes (Signed)
S/W  JAVIER @ [facility] # [phone]   BRILINTA 90 MG BID 30 /60   COVER- YES  CO-PAY- $ 42.00  TIER- 3 DRUG  PRIOR APPROVAL-NO   PHARMACY : [facility]

## 2016-10-13 NOTE — Progress Notes (Signed)
PROGRESS NOTE    Shawna [name]  MOQ:[id number] DOB: 02-04-1941 DOA: 10/09/2016 PCP: [name], MD   Brief Narrative: Shawna [name] is a 76 y.o. femalewith medical history significant of ESRD on HD (M/W/F), CHF, CAD s/p CABG, NSTEMI, HTN, GI bleed from AVM.   Assessment & Plan:   Principal Problem:   Hypertensive urgency Active Problems:   Atherosclerosis of native coronary artery of native heart without angina pectoris   ESRD on hemodialysis (HCC)   Non-STEMI (non-ST elevated myocardial infarction) (HCC)   Chronic diastolic CHF (congestive heart failure) (HCC)   Acute on chronic respiratory failure with hypoxia (HCC)   Fluid overload   Acute respiratory failure with hypoxemia Secondary to fluid overload from CHF exacerbation likely secondary to NSTEMI. Resolved.  Chest pain CAD s/p CABG and recent NSTEMI Recent cath in March 2018 significant for high grade stenosis of DES. Recurrent NSTEMI on this admission. Cath performed this morning. -cardiology recommendations -continue nitroglycerin -continue aspirin -continue Lipitor  Sepsis Secondary to HCAP. Resolved. Blood cultures with no growth so far -blood cultures pending  HCAP Initially treated with Vancomycin and cefepime. Transitioned to doxycycline -continue doxycycline  Hypertensive emergency Patient initially with urgency with evidence of heart failure and NSTEMI. Blood pressure improved but still uncontrolled (medications held for cath). -continue Clonidine, coreg, hydralazine, amlodipine  ESRD on HD -nephrology recommendations  Paroxysmal atrial fibrillation -continue amiodarone  GERD -continue Protonix   DVT prophylaxis: Heparin subq Code Status: Full code Family Communication: None at bedside Disposition Plan: Discharge pending continued cardiac workup   Consultants:   Cardiology  Procedures:   None  Antimicrobials:  Vancomycin  Cefepime  Doxycycline    Subjective: No chest  pain overnight. No dyspnea. No palpitations.   Objective: Vitals:   10/12/16 2130 10/12/16 2142 10/12/16 2218 10/13/16 0530  BP: (!) 160/67 (!) 160/68 (!) 201/72 (!) 120/43  Pulse: 77 82 85 66  Resp: 18 20 18 11   Temp:  98.4 F (36.9 C) 97.6 F (36.4 C) 98.7 F (37.1 C)  TempSrc:  Oral Oral Oral  SpO2:  98% 96% 98%  Weight:  55.3 kg (121 lb 14.6 oz)    Height:        Intake/Output Summary (Last 24 hours) at 10/13/16 0724 Last data filed at 10/13/16 0600  Gross per 24 hour  Intake            805.2 ml  Output             2000 ml  Net          -1194.8 ml   Filed Weights   10/12/16 0300 10/12/16 1842 10/12/16 2142  Weight: 58.3 kg (128 lb 8.5 oz) 57.3 kg (126 lb 5.2 oz) 55.3 kg (121 lb 14.6 oz)    Examination:  General exam: Appears calm and comfortable Respiratory system: Clear to auscultation. Respiratory effort normal. Cardiovascular system: S1 & S2 heard, RRR. No murmurs. Gastrointestinal system: Abdomen is nondistended, soft and nontender. Normal bowel sounds heard. Central nervous system: Alert and oriented. No focal neurological deficits. Extremities: No edema. No calf tenderness Skin: No cyanosis. No rashes Psychiatry: Judgement and insight appear normal. Mood & affect appropriate.     Data Reviewed: I have personally reviewed following labs and imaging studies  CBC:  Recent Labs Lab 10/09/16 2325 10/10/16 0450 10/11/16 0219 10/12/16 0445 10/13/16 0238  WBC 12.0* 12.5* 7.7 8.3 8.1  NEUTROABS 9.0*  --   --   --   --  HGB 11.7* 10.7* 8.9* 9.4* 9.3*  HCT 37.0 34.2* 28.5* 29.4* 30.3*  MCV 96.9 97.7 97.6 98.7 97.7  PLT 212 199 150 168 944   Basic Metabolic Panel:  Recent Labs Lab 10/09/16 2325 10/10/16 0450 10/11/16 0219 10/12/16 0445  NA 140 140 137 136  K 4.8 5.1 4.6 4.3  CL 96* 97* 97* 100*  CO2 24 26 28 24   GLUCOSE 139* 140* 105* 102*  BUN 54* 59* 30* 19  CREATININE 9.96* 10.41* 6.71* 5.22*  CALCIUM 9.7 9.2 9.3 9.3  MG  --   --   --   1.7   GFR: Estimated Creatinine Clearance: 7.3 mL/min (A) (by C-G formula based on SCr of 5.22 mg/dL (H)). Liver Function Tests:  Recent Labs Lab 10/09/16 2325  AST 28  ALT 29  ALKPHOS 97  BILITOT 0.7  PROT 7.2  ALBUMIN 4.2   No results for input(s): LIPASE, AMYLASE in the last 168 hours. No results for input(s): AMMONIA in the last 168 hours. Coagulation Profile:  Recent Labs Lab 10/09/16 2325  INR 0.91   Cardiac Enzymes:  Recent Labs Lab 10/10/16 0450 10/10/16 1513 10/11/16 1056  TROPONINI 9.32* 23.73* 13.05*   BNP (last 3 results) No results for input(s): PROBNP in the last 8760 hours. HbA1C: No results for input(s): HGBA1C in the last 72 hours. CBG: No results for input(s): GLUCAP in the last 168 hours. Lipid Profile: No results for input(s): CHOL, HDL, LDLCALC, TRIG, CHOLHDL, LDLDIRECT in the last 72 hours. Thyroid Function Tests: No results for input(s): TSH, T4TOTAL, FREET4, T3FREE, THYROIDAB in the last 72 hours. Anemia Panel: No results for input(s): VITAMINB12, FOLATE, FERRITIN, TIBC, IRON, RETICCTPCT in the last 72 hours. Sepsis Labs:  Recent Labs Lab 10/09/16 2337 10/10/16 0450 10/10/16 0723  PROCALCITON  --  0.55  --   LATICACIDVEN 2.00* 2.2* 2.1*    Recent Results (from the past 240 hour(s))  Blood Culture (routine x 2)     Status: None (Preliminary result)   Collection Time: 10/10/16 12:44 AM  Result Value Ref Range Status   Specimen Description BLOOD LEFT ARM  Final   Special Requests   Final    BOTTLES DRAWN AEROBIC AND ANAEROBIC Blood Culture adequate volume   Culture NO GROWTH 2 DAYS  Final   Report Status PENDING  Incomplete  Blood Culture (routine x 2)     Status: None (Preliminary result)   Collection Time: 10/10/16  1:24 AM  Result Value Ref Range Status   Specimen Description BLOOD RIGHT ARM  Final   Special Requests IN PEDIATRIC BOTTLE Blood Culture adequate volume  Final   Culture NO GROWTH 2 DAYS  Final   Report Status  PENDING  Incomplete         Radiology Studies: Dg Chest Port 1 View  Result Date: 10/12/2016 CLINICAL DATA:  Pulmonary edema, chest pain last night EXAM: PORTABLE CHEST 1 VIEW COMPARISON:  10/10/2016 FINDINGS: Cardiomegaly again noted. Status post CABG. Double lumen right IJ catheter is unchanged in position. Improvement in aeration without convincing pulmonary edema. Persistent trace bilateral pleural effusion with bilateral basilar atelectasis or infiltrate. IMPRESSION: Status post CABG. Double lumen right IJ catheter is unchanged in position. Improvement in aeration without convincing pulmonary edema. Persistent trace bilateral pleural effusion with bilateral basilar atelectasis or infiltrate. Electronically Signed   By: Lahoma Crocker M.D.   On: 10/12/2016 08:50        Scheduled Meds: . sodium chloride   Intravenous Once  .  amiodarone  200 mg Oral Daily  . amLODipine  2.5 mg Oral QHS  . aspirin EC  81 mg Oral Daily  . atorvastatin  40 mg Oral q1800  . carvedilol  3.125 mg Oral BID WC  . cinacalcet  30 mg Oral Q supper  . cloNIDine  0.2 mg Oral BID  . doxercalciferol  7 mcg Intravenous Q M,W,F-HD  . doxycycline  100 mg Oral Q12H  . hydrALAZINE  75 mg Oral Q8H  . isosorbide mononitrate  120 mg Oral Daily  . lanthanum  1,000 mg Oral TID PC  . multivitamin  1 tablet Oral QHS  . pantoprazole  40 mg Oral Daily  . sodium chloride flush  3 mL Intravenous Q12H  . sodium chloride flush  3 mL Intravenous Q12H   Continuous Infusions: . heparin 800 Units/hr (10/12/16 1900)     LOS: 3 days     Cordelia Poche, MD [facility] 10/13/2016, 7:24 AM Pager: [phone]  If 7PM-7AM, please contact night-coverage [email or web address] Password [facility] 10/13/2016, 7:24 AM

## 2016-10-13 NOTE — Progress Notes (Addendum)
[facility] Progress Note   Subjective: no c/o.  Had another heart cath with PTA/ stent to another blockage in the native diagonal off the LAD which is not bypassed.   Vitals:   10/13/16 1015 10/13/16 1030 10/13/16 1048 10/13/16 1100  BP: (!) 155/47 (!) 186/58 (!) 160/68 (!) 167/53  Pulse: 67  67 67  Resp: 18 16 16 19   Temp:   97.9 F (36.6 C)   TempSrc:   Oral   SpO2: 95%  97% 96%  Weight:      Height:        Inpatient medications: . amiodarone  200 mg Oral Daily  . amLODipine  2.5 mg Oral QHS  . aspirin  81 mg Oral Daily  . atorvastatin  80 mg Oral q1800  . carvedilol  3.125 mg Oral BID WC  . cinacalcet  30 mg Oral Q supper  . cloNIDine  0.2 mg Oral BID  . doxercalciferol  7 mcg Intravenous Q M,W,F-HD  . doxycycline  100 mg Oral Q12H  . hydrALAZINE  75 mg Oral Q8H  . isosorbide mononitrate  120 mg Oral Daily  . lanthanum  1,000 mg Oral TID PC  . multivitamin  1 tablet Oral QHS  . pantoprazole  40 mg Oral Daily  . sodium chloride flush  3 mL Intravenous Q12H  . sodium chloride flush  3 mL Intravenous Q12H  . ticagrelor  90 mg Oral BID    sodium chloride, acetaminophen, albuterol, ALPRAZolam, hydrALAZINE, labetalol, nitroGLYCERIN, ondansetron **OR** ondansetron (ZOFRAN) IV, ondansetron (ZOFRAN) IV, sodium chloride flush  Exam: Alert,  No jvd Chest dec'd at bases, no rales, no ^wob RRR no RG ABd soft ntnd no ascites Trace LE edema R IJ cath , old AVF L arm  Dialysis: [facility] MWF   3h  2/2.5 bath  58kg   300/600   Hep 2000 then 200/hr - Hect 7ug - Needs benadryl 25 mg 25 minutes prior to giving venofer      Assessment: 1. Acute pulm edema/ resp failure - due to ischemia and/or vol excess. resolved 2. ESRD MWF hd, using TDC, has failed LUA AVF, hx severe steal after RUE AVG, removed 3. HTN - on 5 BP meds 4. Volume - has lost body wt, lowering dry wt to 55kg or less 5. +troponin - w/u per cards, sp cath with PCI today to  diagonal 6. Anemia not on esa or Fe now 7. CKD/ MBD - cont meds 8. PAF - on amio, in NSR  Plan - HD Friday, cont to lower dry wt    Kelly Splinter MD [place] pager [phone]   10/13/2016, 2:09 PM    Recent Labs Lab 10/10/16 0450 10/11/16 0219 10/12/16 0445  NA 140 137 136  K 5.1 4.6 4.3  CL 97* 97* 100*  CO2 26 28 24   GLUCOSE 140* 105* 102*  BUN 59* 30* 19  CREATININE 10.41* 6.71* 5.22*  CALCIUM 9.2 9.3 9.3    Recent Labs Lab 10/09/16 2325  AST 28  ALT 29  ALKPHOS 97  BILITOT 0.7  PROT 7.2  ALBUMIN 4.2    Recent Labs Lab 10/09/16 2325  10/11/16 0219 10/12/16 0445 10/13/16 0238  WBC 12.0*  < > 7.7 8.3 8.1  NEUTROABS 9.0*  --   --   --   --   HGB 11.7*  < > 8.9* 9.4* 9.3*  HCT 37.0  < > 28.5* 29.4* 30.3*  MCV 96.9  < >  97.6 98.7 97.7  PLT 212  < > 150 168 171  < > = values in this interval not displayed. Iron/TIBC/Ferritin/ %Sat    Component Value Date/Time   IRON 62 09/19/2016 2124   TIBC 230 (L) 09/19/2016 2124   FERRITIN 1,122 (H) 09/19/2016 2124   IRONPCTSAT 27 09/19/2016 2124

## 2016-10-13 NOTE — H&P (View-Only) (Signed)
The patient has been seen in conjunction with Harlan Stains, PAC. All aspects of care have been considered and discussed. The patient has been personally interviewed, examined, and all clinical data has been reviewed.   Recurrent prolonged chest pain last night.  Echo result is still pending.  Need to proceed with repeat catheterization and possible PCI to better understand why the patient would continue to have recurring angina in absence of severe blood pressure elevation. Perhaps it is related to the diagonal which would need to be redilated. Not sure it could be restented because of size. Rule out plaque rupture in other territories that has developed in the time frame since cath 3 weeks ago.  We will setup the patient for coronary angiography tomorrow a.m. IV heparin until then.  Discussed this with the patient early this morning including the risk of stroke, death, myocardial infarction, bleeding, emergency bypass surgery, infection    Progress Note  Patient Name: Shawna [name] Date of Encounter: 10/12/2016  Primary Cardiologist: [place] better this morning, weaned from O2. Did have brief episode of chest pain with elevated blood pressure and cough last night. Resolved with SL nitro.  Inpatient Medications    Scheduled Meds: . amiodarone  200 mg Oral Daily  . amLODipine  2.5 mg Oral QHS  . aspirin EC  81 mg Oral Daily  . atorvastatin  40 mg Oral q1800  . carvedilol  3.125 mg Oral BID WC  . cinacalcet  30 mg Oral Q supper  . cloNIDine  0.2 mg Oral BID  . doxercalciferol  7 mcg Intravenous Q M,W,F-HD  . doxycycline  100 mg Oral Q12H  . heparin  5,000 Units Subcutaneous Q8H  . hydrALAZINE  75 mg Oral Q8H  . isosorbide mononitrate  120 mg Oral Daily  . lanthanum  1,000 mg Oral TID PC  . multivitamin  1 tablet Oral QHS  . pantoprazole  40 mg Oral Daily  . sodium chloride flush  3 mL Intravenous Q12H   Continuous Infusions:  PRN Meds: albuterol,  ALPRAZolam, nitroGLYCERIN, ondansetron **OR** ondansetron (ZOFRAN) IV   Vital Signs    Vitals:   10/12/16 0524 10/12/16 0748 10/12/16 0800 10/12/16 0900  BP: (!) 138/50 (!) 153/77 (!) 168/67 (!) 185/70  Pulse: 76 80 76 91  Resp: 15 18 14  (!) 21  Temp: 98.2 F (36.8 C) 98.6 F (37 C)    TempSrc: Oral Oral    SpO2: 100% 97% 96% 97%  Weight:      Height:        Intake/Output Summary (Last 24 hours) at 10/12/16 1004 Last data filed at 10/12/16 0800  Gross per 24 hour  Intake              600 ml  Output             2000 ml  Net            -1400 ml   Filed Weights   10/11/16 1302 10/11/16 1602 10/12/16 0300  Weight: 128 lb 4.9 oz (58.2 kg) 123 lb 7.3 oz (56 kg) 128 lb 8.5 oz (58.3 kg)    Telemetry    SR - Personally Reviewed  ECG    SR with LVH - Personally Reviewed  Physical Exam   General: Well developed, well nourished, older AA female appearing in no acute distress. Head: Normocephalic, atraumatic.  Neck: Supple without bruits, JVD. Lungs:  Resp regular and unlabored, Diminished  lower lobes. Heart: RRR, S1, S2, no S3, S4, or murmur; no rub. Abdomen: Soft, non-tender, non-distended with normoactive bowel sounds. No hepatomegaly. No rebound/guarding. No obvious abdominal masses. Extremities: No clubbing, cyanosis, trace ankle edema. Distal pedal pulses are 2+ bilaterally.  LUA fistula, HD cath right chest. Neuro: Alert and oriented X 3. Moves all extremities spontaneously. Psych: Normal affect.  Labs    Chemistry Recent Labs Lab 10/09/16 2325 10/10/16 0450 10/11/16 0219 10/12/16 0445  NA 140 140 137 136  K 4.8 5.1 4.6 4.3  CL 96* 97* 97* 100*  CO2 24 26 28 24   GLUCOSE 139* 140* 105* 102*  BUN 54* 59* 30* 19  CREATININE 9.96* 10.41* 6.71* 5.22*  CALCIUM 9.7 9.2 9.3 9.3  PROT 7.2  --   --   --   ALBUMIN 4.2  --   --   --   AST 28  --   --   --   ALT 29  --   --   --   ALKPHOS 97  --   --   --   BILITOT 0.7  --   --   --   GFRNONAA 3* 3* 5* 7*  GFRAA  4* 4* 6* 8*  ANIONGAP 20* 17* 12 12     Hematology Recent Labs Lab 10/10/16 0450 10/11/16 0219 10/12/16 0445  WBC 12.5* 7.7 8.3  RBC 3.50* 2.92* 2.98*  HGB 10.7* 8.9* 9.4*  HCT 34.2* 28.5* 29.4*  MCV 97.7 97.6 98.7  MCH 30.6 30.5 31.5  MCHC 31.3 31.2 32.0  RDW 17.8* 17.4* 17.6*  PLT 199 150 168    Cardiac Enzymes Recent Labs Lab 10/10/16 0450 10/10/16 1513 10/11/16 1056  TROPONINI 9.32* 23.73* 13.05*    Recent Labs Lab 10/09/16 2335  TROPIPOC 0.06     BNPNo results for input(s): BNP, PROBNP in the last 168 hours.   DDimer No results for input(s): DDIMER in the last 168 hours.    Radiology    Dg Chest Port 1 View  Result Date: 10/12/2016 CLINICAL DATA:  Pulmonary edema, chest pain last night EXAM: PORTABLE CHEST 1 VIEW COMPARISON:  10/10/2016 FINDINGS: Cardiomegaly again noted. Status post CABG. Double lumen right IJ catheter is unchanged in position. Improvement in aeration without convincing pulmonary edema. Persistent trace bilateral pleural effusion with bilateral basilar atelectasis or infiltrate. IMPRESSION: Status post CABG. Double lumen right IJ catheter is unchanged in position. Improvement in aeration without convincing pulmonary edema. Persistent trace bilateral pleural effusion with bilateral basilar atelectasis or infiltrate. Electronically Signed   By: Lahoma Crocker M.D.   On: 10/12/2016 08:50   Dg Chest Port 1 View  Result Date: 10/10/2016 CLINICAL DATA:  Initial evaluation for pulmonary edema. Shortness of breath with chest pain. EXAM: PORTABLE CHEST 1 VIEW COMPARISON:  Prior radiograph from 10/09/2016. FINDINGS: A right-sided dual-lumen dialysis catheter in place, stable. Median sternotomy wires with underlying CABG markers and surgical clips unchanged. Stable cardiomegaly. Mediastinal silhouette within normal limits. Aortic atherosclerosis. Lungs normally inflated. Diffuse vascular congestion with scattered interstitial prominence in Kerley B-lines,  compatible pulmonary edema, worse within the right lung. Small bilateral pleural effusions, left greater than right. No focal infiltrates. No pneumothorax. No acute osseous abnormality. IMPRESSION: 1. Cardiomegaly with mild diffuse pulmonary edema and small bilateral pleural effusions, consistent with acute CHF exacerbation. 2. Sequelae of prior CABG. 3. Aortic atherosclerosis. Electronically Signed   By: Jeannine Boga M.D.   On: 10/10/2016 15:48    Cardiac Studies   TTE: pending  Patient Profile     76 y.o. female PMH of CAD s/p CABG with recent NSTEMI, HTN, GI bleed from AVM and ESRD on HD who presented with chest pain and elevated troponin.  Assessment & Plan    1. Chest pain:Sudden onset with dyspnea. Similar to previous pain with her last cath. This was in the setting of volume overload, and sepsis 2/2 PNA. Last cath noted diffuse disease with patent grafts. Small caliber vessel first diag disease noted, but felt to be high risk for PCI and her allergies. Treated medically.  -- Trop peak at 23 this admission in the setting of acute illness and volume overload, now trending down. EKG showed diffuse ST depressions with elevated rate.   -- had an episode of chest pain last night while coughing, and elevated blood pressure. Relieved by SL nitro. Discuss with MD. -- echo still pending. Called to ask that this be done soon.   2. LHT:DSKAJGOTL hypertensive on admission and required nitro gtt. Blood pressure has since improve, with medications and HD. Continues to have intermittent episodes of hypertension, but also lows.  -- will continue with current regimen as dialysis is still being adjusted.  3. ESRD on XB:WIOMBTDH by nephrology, had HD again yesterday.      4. SIRS 2/2 HCAP: sepsis now resolved, primary managing  5. Acute on Chronic diastolic HF: Has now weaned from O2, breathing much improved.  -- Volume management via HD  Signed, Reino Bellis, NP  10/12/2016, 10:04 AM

## 2016-10-13 NOTE — Progress Notes (Signed)
PT Cancellation Note  Patient Details Name: Shawna [name] MRN: [id number] DOB: April 27, 1941   Cancelled Treatment:    Reason Eval/Treat Not Completed: Medical issues which prohibited therapy (pt on bedrest after cath)   Shary Decamp [facility] 10/13/2016, 11:23 AM Suanne Marker PT [phone]

## 2016-10-14 ENCOUNTER — Other Ambulatory Visit: Payer: Self-pay

## 2016-10-14 DIAGNOSIS — Z955 Presence of coronary angioplasty implant and graft: Secondary | ICD-10-CM

## 2016-10-14 DIAGNOSIS — R0902 Hypoxemia: Secondary | ICD-10-CM

## 2016-10-14 LAB — BASIC METABOLIC PANEL
ANION GAP: 15 (ref 5–15)
BUN: 37 mg/dL — AB (ref 6–20)
CHLORIDE: 91 mmol/L — AB (ref 101–111)
CO2: 23 mmol/L (ref 22–32)
Calcium: 10 mg/dL (ref 8.9–10.3)
Creatinine, Ser: 7.01 mg/dL — ABNORMAL HIGH (ref 0.44–1.00)
GFR calc Af Amer: 6 mL/min — ABNORMAL LOW (ref >60)
GFR, EST NON AFRICAN AMERICAN: 5 mL/min — AB (ref >60)
GLUCOSE: 119 mg/dL — AB (ref 65–99)
POTASSIUM: 4.4 mmol/L (ref 3.5–5.1)
Sodium: 129 mmol/L — ABNORMAL LOW (ref 135–145)

## 2016-10-14 LAB — CBC
HEMATOCRIT: 27 % — AB (ref 36.0–46.0)
HEMOGLOBIN: 8.9 g/dL — AB (ref 12.0–15.0)
MCH: 31.3 pg (ref 26.0–34.0)
MCHC: 33 g/dL (ref 30.0–36.0)
MCV: 95.1 fL (ref 78.0–100.0)
PLATELETS: 215 10*3/uL (ref 150–400)
RBC: 2.84 MIL/uL — AB (ref 3.87–5.11)
RDW: 17.5 % — ABNORMAL HIGH (ref 11.5–15.5)
WBC: 12.6 10*3/uL — AB (ref 4.0–10.5)

## 2016-10-14 LAB — HEPARIN LEVEL (UNFRACTIONATED): Heparin Unfractionated: <0.1 IU/mL — ABNORMAL LOW (ref 0.30–0.70)

## 2016-10-14 MED ORDER — SODIUM CHLORIDE 0.9 % IV SOLN: 100.0000 mL | INTRAVENOUS | Status: DC | PRN

## 2016-10-14 MED ORDER — CARVEDILOL 6.25 MG PO TABS: 6.2500 mg | ORAL_TABLET | Freq: Two times a day (BID) | ORAL | 0 refills | Status: DC

## 2016-10-14 MED ORDER — HEPARIN SODIUM (PORCINE) 1000 UNIT/ML DIALYSIS: 1000.0000 [IU] | INTRAMUSCULAR | Status: DC | PRN

## 2016-10-14 MED ORDER — CARVEDILOL 3.125 MG PO TABS
6.2500 mg | ORAL_TABLET | Freq: Two times a day (BID) | ORAL | Status: DC
  Administered 2016-10-14: 18:00:00 6.25 mg via ORAL
  Filled 2016-10-14: qty 2

## 2016-10-14 MED ORDER — ALTEPLASE 2 MG IJ SOLR: 2.0000 mg | Freq: Once | INTRAMUSCULAR | Status: DC | PRN

## 2016-10-14 MED ORDER — CARVEDILOL 3.125 MG PO TABS: 3.1250 mg | ORAL_TABLET | Freq: Once | ORAL | Status: AC

## 2016-10-14 MED ORDER — PENTAFLUOROPROP-TETRAFLUOROETH EX AERO: 1.0000 "application " | INHALATION_SPRAY | CUTANEOUS | Status: DC | PRN

## 2016-10-14 MED ORDER — LIDOCAINE-PRILOCAINE 2.5-2.5 % EX CREA: 1.0000 "application " | TOPICAL_CREAM | CUTANEOUS | Status: DC | PRN

## 2016-10-14 MED ORDER — DOXYCYCLINE HYCLATE 100 MG PO TABS: 100.0000 mg | ORAL_TABLET | Freq: Two times a day (BID) | ORAL | 0 refills | Status: AC

## 2016-10-14 MED ORDER — LIDOCAINE HCL (PF) 1 % IJ SOLN: 5.0000 mL | INTRAMUSCULAR | Status: DC | PRN

## 2016-10-14 MED ORDER — TICAGRELOR 90 MG PO TABS: 90.0000 mg | ORAL_TABLET | Freq: Two times a day (BID) | ORAL | 3 refills | Status: DC

## 2016-10-14 NOTE — Evaluation (Signed)
Physical Therapy Evaluation Patient Details Name: Shawna [name] MRN: [id number] DOB: 1941/05/20 Today's Date: 10/14/2016   History of Present Illness  Shawna [name] is a 76 y.o. female with medical history significant of ESRD on HD (M/W/F), CHF, CAD s/p CABG, NSTEMI, HTN, GI bleed from AVM.  Admitted 10/09/16 due to CP, COB, elevated BP now s/p cath due to NSTEMI.  Clinical Impression  Patient presents with generalized weakness due to immobility and will benefit from skilled PT in the acute setting to allow return home with family support.  Discussed possible referral to outpatient cardiac rehab and RN in after PT for education.     Follow Up Recommendations No PT follow up;Supervision - Intermittent (discussed possible outpatient cardiac rehab)    Equipment Recommendations  None recommended by PT    Recommendations for Other Services       Precautions / Restrictions Precautions Precautions: Fall Precaution Comments: watch BP      Mobility  Bed Mobility   Bed Mobility: Sit to Supine       Sit to supine: Supervision;HOB elevated   General bed mobility comments: cues for positioning  Transfers Overall transfer level: Needs assistance Equipment used: None Transfers: Sit to/from Stand Sit to Stand: Supervision         General transfer comment: rises unaided, S for safety  Ambulation/Gait Ambulation/Gait assistance: Min assist Ambulation Distance (Feet): 225 Feet Assistive device: 1 person hand held assist Gait Pattern/deviations: Step-through pattern;Decreased stride length;Shuffle     General Gait Details: HHA, ususally uses cane at home, some imbalance due to bedrest/immobility.  No overt LOB, reliant on HHA  Stairs            Wheelchair Mobility    Modified Rankin (Stroke Patients Only)       Balance Overall balance assessment: Needs assistance   Sitting balance-Leahy Scale: Good       Standing balance-Leahy Scale: Fair Standing balance comment:  stands unsupported in room beside bed and in hallway for HR check                             Pertinent Vitals/Pain Pain Assessment: No/denies pain    Home Living Family/patient expects to be discharged to:: Private residence Living Arrangements: Spouse/significant other Available Help at Discharge: Family;Available 24 hours/day Type of Home: House Home Access: Stairs to enter Entrance Stairs-Rails: Right Entrance Stairs-Number of Steps: 3 Home Layout: One level Home Equipment: Walker - 2 wheels;Cane - single point;Shower seat      Prior Function Level of Independence: Needs assistance   Gait / Transfers Assistance Needed: Spouse assist up stairs and to step into tub  ADL's / Homemaking Assistance Needed: does some household work, spouse does most  Comments: uses straight cane, spouse assist pt on stairs     Hand Dominance   Dominant Hand: Right    Extremity/Trunk Assessment   Upper Extremity Assessment Upper Extremity Assessment: Overall WFL for tasks assessed    Lower Extremity Assessment Lower Extremity Assessment: Generalized weakness       Communication   Communication: No difficulties  Cognition Arousal/Alertness: Awake/alert Behavior During Therapy: WFL for tasks assessed/performed Overall Cognitive Status: Within Functional Limits for tasks assessed                                        General Comments General  comments (skin integrity, edema, etc.): BP initially 216/83; after ambulation 246/66, HR 111; supine 215/62 HR 86; RN delivered BP meds at beginning of session    Exercises     Assessment/Plan    PT Assessment Patient needs continued PT services  PT Problem List Decreased strength;Decreased balance;Decreased activity tolerance;Decreased mobility       PT Treatment Interventions DME instruction;Gait training;Therapeutic exercise;Patient/family education;Therapeutic activities;Balance training;Stair  training;Functional mobility training    PT Goals (Current goals can be found in the Care Plan section)  Acute Rehab PT Goals Patient Stated Goal: To return home (today if able) PT Goal Formulation: With patient/family Time For Goal Achievement: 10/17/16 Potential to Achieve Goals: Good    Frequency Min 3X/week   Barriers to discharge        Co-evaluation               End of Session Equipment Utilized During Treatment: Gait belt Activity Tolerance: Patient tolerated treatment well Patient left: in bed;with call bell/phone within reach;with family/visitor present;with nursing/sitter in room (cardiac rehab in room)   PT Visit Diagnosis: Muscle weakness (generalized) (M62.81);Other abnormalities of gait and mobility (R26.89)    Time: 5747-3403 PT Time Calculation (min) (ACUTE ONLY): 26 min   Charges:   PT Evaluation $PT Eval Moderate Complexity: 1 Procedure PT Treatments $Gait Training: 8-22 mins   PT G CodesMagda Kiel, [place] [phone] 10/14/2016   Reginia Naas 10/14/2016, 10:26 AM

## 2016-10-14 NOTE — Progress Notes (Signed)
[facility] Progress Note   Subjective: no c/o   Vitals:   10/14/16 1221 10/14/16 1300 10/14/16 1330 10/14/16 1400  BP: (!) 150/74 (!) 138/57 (!) 111/50 (!) 124/53  Pulse: 80 75 74 74  Resp:      Temp:      TempSrc:      SpO2:      Weight:      Height:        Inpatient medications: . amiodarone  200 mg Oral Daily  . amLODipine  2.5 mg Oral QHS  . aspirin  81 mg Oral Daily  . atorvastatin  80 mg Oral q1800  . carvedilol  3.125 mg Oral Once  . carvedilol  6.25 mg Oral BID WC  . cinacalcet  30 mg Oral Q supper  . cloNIDine  0.2 mg Oral BID  . doxercalciferol  7 mcg Intravenous Q M,W,F-HD  . doxycycline  100 mg Oral Q12H  . hydrALAZINE  75 mg Oral Q8H  . isosorbide mononitrate  120 mg Oral Daily  . lanthanum  1,000 mg Oral TID PC  . multivitamin  1 tablet Oral QHS  . pantoprazole  40 mg Oral Daily  . sodium chloride flush  3 mL Intravenous Q12H  . sodium chloride flush  3 mL Intravenous Q12H  . ticagrelor  90 mg Oral BID    sodium chloride, sodium chloride, sodium chloride, acetaminophen, albuterol, ALPRAZolam, alteplase, heparin, lidocaine (PF), lidocaine-prilocaine, nitroGLYCERIN, ondansetron **OR** ondansetron (ZOFRAN) IV, ondansetron (ZOFRAN) IV, pentafluoroprop-tetrafluoroeth, senna-docusate, sodium chloride flush  Exam: Alert,  No jvd Chest dec'd at bases, no rales, no ^wob RRR no RG ABd soft ntnd no ascites Trace LE edema R IJ cath , old AVF L arm  Dialysis: [facility] MWF   3h  2/2.5 bath  58kg   300/600   Hep 2000 then 200/hr - Hect 7ug - Needs benadryl 25 mg 25 minutes prior to giving venofer      Assessment: 1. Acute pulm edema/ resp failure - due to ischemia and/or vol excess. resolved 2. ESRD MWF hd, using TDC, has failed LUA AVF, hx severe steal after RUE AVG, removed 3. HTN - on 5 BP meds 4. Volume - has lost body wt, lowering dry wt to 55kg or less 5. +troponin - w/u per cards, sp cath with PCI to diagonal on 4/5 6. Anemia  not on esa or Fe now 7. CKD/ MBD - cont meds 8. PAF - on amio, in NSR  Plan - HD Friday, lower dry wt at dc. Prob dc today after HD.    Kelly Splinter MD [facility] pager [phone]   10/14/2016, 2:56 PM    Recent Labs Lab 10/11/16 0219 10/12/16 0445 10/14/16 0250  NA 137 136 129*  K 4.6 4.3 4.4  CL 97* 100* 91*  CO2 28 24 23   GLUCOSE 105* 102* 119*  BUN 30* 19 37*  CREATININE 6.71* 5.22* 7.01*  CALCIUM 9.3 9.3 10.0    Recent Labs Lab 10/09/16 2325  AST 28  ALT 29  ALKPHOS 97  BILITOT 0.7  PROT 7.2  ALBUMIN 4.2    Recent Labs Lab 10/09/16 2325  10/12/16 0445 10/13/16 0238 10/14/16 0250  WBC 12.0*  < > 8.3 8.1 12.6*  NEUTROABS 9.0*  --   --   --   --   HGB 11.7*  < > 9.4* 9.3* 8.9*  HCT 37.0  < > 29.4* 30.3* 27.0*  MCV 96.9  < > 98.7 97.7  95.1  PLT 212  < > 168 171 215  < > = values in this interval not displayed. Iron/TIBC/Ferritin/ %Sat    Component Value Date/Time   IRON 62 09/19/2016 2124   TIBC 230 (L) 09/19/2016 2124   FERRITIN 1,122 (H) 09/19/2016 2124   IRONPCTSAT 27 09/19/2016 2124

## 2016-10-14 NOTE — Discharge Summary (Signed)
Physician Discharge Summary  Shawna [name] ZOX:[id number] DOB: [birth year] DOA: 10/09/2016  PCP: Shawna Percy, MD  Admit date: 10/09/2016 Discharge date: 10/14/2016  Admitted From: Home Disposition: Home  Recommendations for Outpatient Follow-up:  1. Follow up with PCP in 1 week 2. Follow up with cardiologist 3. Cardiac rehab 4. Started on Brillinta  5. Follow-up: Blood culture results  Discharge Condition: Stable CODE STATUS: Full code Diet recommendation: Heart healthy   Brief/Interim Summary:  Admission HPI written by Shawna Morton, MD   Chief Complaint: Chest pain  HPI: Shawna [name] is a 76 y.o. female with medical history significant of ESRD on HD(M/W/F), CHF, CAD s/p CABG, NSTEMI, HTN, GI bleed from AVM; who presents with complaints of complaints of chest pain last night around 9:30 PM.  Associated symptoms included acute onset of shortness of breath. Patient reportedly tried taking 3 nitroglycerin without relief of symptoms. Her last dialysis session was on Friday and she felt like she had extra fluid still on her at that time.    ED Course:  Upon admission into the emergency department patient was seen have significant signs of pulmonary edema and was placed on BiPAP with some improvement of respiratory distress symptoms. Dr. Justin [name] of Nephrology was consulted and patient to receive dialysis. She was placed on a nitroglycerin drip for elevated blood pressures.    Hospital course:  Acute respiratory failure with hypoxemia Secondary to fluid overload from CHF exacerbation likely secondary to NSTEMI. Resolved.  Chest pain CAD s/p CABG and recent NSTEMI Recent cath in March 2018 significant for high grade stenosis of DES. Recurrent NSTEMI on this admission. Cath performed this on 4/5 and new DES placed overlapping with old stent. Chest pain symptoms resolved. Brillinta started. Patient assessed for cardiac rehabilitation which will be decided after patient's outpatient.  cardiology follow-up. Continue Aspirin and Lipitor on discharge.  Sepsis Secondary to HCAP. Started on broad spectrum antibiotics of vancomycin and cefepime. Resolved with antibiotics. Blood cultures with no growth so far  HCAP Initially treated with Vancomycin and cefepime. Transitioned to doxycycline. Continued on discharge.  Hypertensive emergency Patient initially with urgency with evidence of heart failure and NSTEMI. Blood pressure improved but still uncontrolled. Increased Coreg. Will need continued titration as an outpatient.  ESRD on HD Nephrology performed dialysis.  Paroxysmal atrial fibrillation Continued amiodarone.  GERD Continued Protonix  Discharge Diagnoses:  Principal Problem:   Hypertensive urgency Active Problems:   Atherosclerosis of native coronary artery of native heart without angina pectoris   ESRD on hemodialysis (HCC)   Non-STEMI (non-ST elevated myocardial infarction) (HCC)   Chronic diastolic CHF (congestive heart failure) (HCC)   Acute on chronic respiratory failure with hypoxia (HCC)   Fluid overload    Discharge Instructions  Discharge Instructions    Amb Referral to Cardiac Rehabilitation    Complete by:  As directed    Diagnosis:   Coronary Stents NSTEMI     Call MD for:  extreme fatigue    Complete by:  As directed    Call MD for:  persistant dizziness or light-headedness    Complete by:  As directed    Call MD for:  persistant nausea and vomiting    Complete by:  As directed    Call MD for:  severe uncontrolled pain    Complete by:  As directed    Call MD for:  temperature >100.4    Complete by:  As directed    Increase activity slowly    Complete  by:  As directed      Allergies as of 10/14/2016      Reactions   Aspirin Other (See Comments)   High Doses Mess up her stomach; "makes my bowels have blood in them". Takes 81 mg EC Aspirin    Penicillins Other (See Comments)   SYNCOPE? , "makes me real weak when I take it;  like I'll pass out" Has patient had a PCN reaction causing immediate rash, facial/tongue/throat swelling, SOB or lightheadedness with hypotension: no Has patient had a PCN reaction causing severe rash involving mucus membranes or skin necrosis: no Has patient had a PCN reaction that required hospitalization no Has patient had a PCN reaction occurring within the last 10 years: no If all of the above   Bactrim [sulfamethoxazole-trimethoprim] Rash   Contrast Media [iodinated Diagnostic Agents] Itching   Iron Itching, Other (See Comments)   "they gave me iron in dialysis; had to give me Benadryl cause I had to have the iron" (05/02/2012)   Nitrofurantoin Hives   Prilosec [omeprazole] Other (See Comments)   "back spasms"   Tylenol [acetaminophen] Other (See Comments)   Makes her feet on fire per pt   Dexilant [dexlansoprazole] Other (See Comments)   Upset stomach   Levaquin [levofloxacin In D5w] Rash   Morphine And Related Itching   Itching in feet   Plavix [clopidogrel Bisulfate] Rash   Protonix [pantoprazole Sodium] Rash   Venofer [ferric Oxide] Itching   Patient reports using Benadryl prior to doses as [place]      Medication List    STOP taking these medications   metoprolol tartrate 25 MG tablet Commonly known as:  LOPRESSOR   omeprazole 20 MG capsule Commonly known as:  PRILOSEC   simvastatin 20 MG tablet Commonly known as:  ZOCOR     TAKE these medications   ALPRAZolam 0.25 MG tablet Commonly known as:  XANAX Take 0.25 mg by mouth 2 (two) times daily as needed for anxiety.   amiodarone 200 MG tablet Commonly known as:  PACERONE Take 1 tablet (200 mg total) by mouth daily.   amLODipine 2.5 MG tablet Commonly known as:  NORVASC Take 2.5 mg by mouth daily.   aspirin EC 81 MG tablet Take 81 mg by mouth daily.   atorvastatin 40 MG tablet Commonly known as:  LIPITOR Take 1 tablet (40 mg total) by mouth daily at 6 PM. What changed:  when to take this    carvedilol 6.25 MG tablet Commonly known as:  COREG Take 1 tablet (6.25 mg total) by mouth 2 (two) times daily with a meal. What changed:  medication strength  how much to take   cloNIDine 0.2 MG tablet Commonly known as:  CATAPRES Take 1 tablet (0.2 mg total) by mouth 2 (two) times daily.   doxycycline 100 MG tablet Commonly known as:  VIBRA-TABS Take 1 tablet (100 mg total) by mouth every 12 (twelve) hours.   epoetin alfa 10000 UNIT/ML injection Commonly known as:  EPOGEN,PROCRIT Inject 1 mL (10,000 Units total) into the vein every Monday, Wednesday, and Friday with hemodialysis.   fluticasone 50 MCG/ACT nasal spray Commonly known as:  FLONASE Place 2 sprays into the nose daily as needed for allergies.   hydrALAZINE 50 MG tablet Commonly known as:  APRESOLINE Take 1.5 tablets (75 mg total) by mouth 3 (three) times daily.   isosorbide mononitrate 120 MG 24 hr tablet Commonly known as:  IMDUR Take 1 tablet (120 mg total) by mouth  daily.   lanthanum 1000 MG chewable tablet Commonly known as:  FOSRENOL Chew 1 tablet (1,000 mg total) by mouth 3 (three) times daily with meals. What changed:  when to take this   loratadine 10 MG tablet Commonly known as:  CLARITIN Take 10 mg by mouth daily as needed for allergies.   multivitamin Tabs tablet Take 1 tablet by mouth daily.   nitroGLYCERIN 0.4 MG SL tablet Commonly known as:  NITROSTAT Place 1 tablet (0.4 mg total) under the tongue every 5 (five) minutes as needed for chest pain.   oxyCODONE-acetaminophen 5-325 MG tablet Commonly known as:  PERCOCET/ROXICET Take 1 tablet by mouth every 6 (six) hours as needed for pain.   pantoprazole 40 MG tablet Commonly known as:  PROTONIX Take 1 tablet (40 mg total) by mouth daily.   SENSIPAR 30 MG tablet Generic drug:  cinacalcet Take 30 mg by mouth daily with supper.   ticagrelor 90 MG Tabs tablet Commonly known as:  BRILINTA Take 1 tablet (90 mg total) by mouth 2 (two)  times daily.      Follow-up Information    Kate Sable, MD. Go on 11/04/2016.   Specialty:  Cardiology Why:  @3 :40pm for post hospital follow up Contact information: [place] [address] [phone]        Shawna Percy, MD. Schedule an appointment as soon as possible for a visit in 1 week(s).   Specialty:  Family Medicine Contact information: [address] [place] [address] [phone]          Allergies  Allergen Reactions  . Aspirin Other (See Comments)    High Doses Mess up her stomach; "makes my bowels have blood in them". Takes 81 mg EC Aspirin   . Penicillins Other (See Comments)    SYNCOPE? , "makes me real weak when I take it; like I'll pass out"  Has patient had a PCN reaction causing immediate rash, facial/tongue/throat swelling, SOB or lightheadedness with hypotension: no Has patient had a PCN reaction causing severe rash involving mucus membranes or skin necrosis: no Has patient had a PCN reaction that required hospitalization no Has patient had a PCN reaction occurring within the last 10 years: no If all of the above  . Bactrim [Sulfamethoxazole-Trimethoprim] Rash  . Contrast Media [Iodinated Diagnostic Agents] Itching  . Iron Itching and Other (See Comments)    "they gave me iron in dialysis; had to give me Benadryl cause I had to have the iron" (05/02/2012)  . Nitrofurantoin Hives  . Prilosec [Omeprazole] Other (See Comments)    "back spasms"  . Tylenol [Acetaminophen] Other (See Comments)    Makes her feet on fire per pt  . Dexilant [Dexlansoprazole] Other (See Comments)    Upset stomach  . Levaquin [Levofloxacin In D5w] Rash  . Morphine And Related Itching    Itching in feet  . Plavix [Clopidogrel Bisulfate] Rash  . Protonix [Pantoprazole Sodium] Rash  . Venofer [Ferric Oxide] Itching    Patient reports using Benadryl prior to doses as [place]    Consultations:  Cardiology   Procedures/Studies: Dg Chest 2  View  Result Date: 10/03/2016 CLINICAL DATA:  Acute onset of high blood pressure and headache. Initial encounter. EXAM: CHEST  2 VIEW COMPARISON:  Chest radiograph performed 09/19/2016 FINDINGS: The lungs are well-aerated. Mild left basilar atelectasis is again noted. There is no evidence of focal opacification, pleural effusion or pneumothorax. The heart is borderline enlarged. The patient is status  post median sternotomy, with evidence of prior CABG. No acute osseous abnormalities are seen. A right-sided dual-lumen catheter is noted ending about the cavoatrial junction. Scattered calcification is seen along the proximal abdominal aorta. IMPRESSION: 1. Mild left basilar atelectasis again noted. Lungs otherwise clear. 2. Borderline cardiomegaly. 3. Scattered aortic atherosclerosis. Electronically Signed   By: Garald Balding M.D.   On: 10/03/2016 00:30   Dg Chest 2 View  Result Date: 09/19/2016 CLINICAL DATA:  Dialysis.  Chest pains. EXAM: CHEST  2 VIEW COMPARISON:  09/07/2016. FINDINGS: Dialysis catheter noted with its tip at the cavoatrial junction. Prior CABG. Cardiomegaly. Near complete clearing of previously identified mild interstitial prominence. Mild bibasilar subsegmental atelectasis. Tiny bilateral pleural effusions. No pneumothorax. IMPRESSION: 1. Dialysis catheter noted is stable position. 2. Cardiomegaly. Previously identified pulmonary interstitial edema has almost completely cleared. Mild bibasilar subsegmental atelectasis . Electronically Signed   By: Marcello Moores  Register   On: 09/19/2016 12:02   Dg Chest Port 1 View  Result Date: 10/12/2016 CLINICAL DATA:  Pulmonary edema, chest pain last night EXAM: PORTABLE CHEST 1 VIEW COMPARISON:  10/10/2016 FINDINGS: Cardiomegaly again noted. Status post CABG. Double lumen right IJ catheter is unchanged in position. Improvement in aeration without convincing pulmonary edema. Persistent trace bilateral pleural effusion with bilateral basilar atelectasis or  infiltrate. IMPRESSION: Status post CABG. Double lumen right IJ catheter is unchanged in position. Improvement in aeration without convincing pulmonary edema. Persistent trace bilateral pleural effusion with bilateral basilar atelectasis or infiltrate. Electronically Signed   By: Lahoma Crocker M.D.   On: 10/12/2016 08:50   Dg Chest Port 1 View  Result Date: 10/10/2016 CLINICAL DATA:  Initial evaluation for pulmonary edema. Shortness of breath with chest pain. EXAM: PORTABLE CHEST 1 VIEW COMPARISON:  Prior radiograph from 10/09/2016. FINDINGS: A right-sided dual-lumen dialysis catheter in place, stable. Median sternotomy wires with underlying CABG markers and surgical clips unchanged. Stable cardiomegaly. Mediastinal silhouette within normal limits. Aortic atherosclerosis. Lungs normally inflated. Diffuse vascular congestion with scattered interstitial prominence in Kerley B-lines, compatible pulmonary edema, worse within the right lung. Small bilateral pleural effusions, left greater than right. No focal infiltrates. No pneumothorax. No acute osseous abnormality. IMPRESSION: 1. Cardiomegaly with mild diffuse pulmonary edema and small bilateral pleural effusions, consistent with acute CHF exacerbation. 2. Sequelae of prior CABG. 3. Aortic atherosclerosis. Electronically Signed   By: Jeannine Boga M.D.   On: 10/10/2016 15:48   Dg Chest Portable 1 View  Result Date: 10/09/2016 CLINICAL DATA:  Severe dyspnea, cough and congestion EXAM: PORTABLE CHEST 1 VIEW COMPARISON:  10/03/2016 FINDINGS: Cardiomegaly with aortic atherosclerosis. Kerley B-lines consistent with CHF are identified bilaterally. Median sternotomy sutures and right-sided dialysis catheter tips are again visualized without significant change. Superimposed airspace disease about the right hilum and right lung base cannot be entirely excluded. No acute osseous abnormality. IMPRESSION: Cardiomegaly with aortic atherosclerosis. Interval development of  CHF. Superimposed airspace disease/pneumonia at the right lung base and about the right hilum is not entirely excluded. Electronically Signed   By: Ashley Royalty M.D.   On: 10/09/2016 23:56   US Abdomen Limited Ruq  Result Date: 10/03/2016 CLINICAL DATA:  Right upper quadrant pain, onset yesterday. EXAM: US ABDOMEN LIMITED - RIGHT UPPER QUADRANT COMPARISON:  Ultrasound 03/22/2016, CT 03/03/2016 FINDINGS: Gallbladder: Physiologically distended. No gallstones. Mild gallbladder wall thickening measuring 3-4 mm. Trace pericholecystic fluid. No sonographic Murphy sign noted by sonographer. Common bile duct: Diameter: 2 mm. Liver: No focal lesion identified. Within normal limits in parenchymal echogenicity. Normal  directional flow in the main portal vein. Incidental note of increased right renal echogenicity and right renal cyst. IMPRESSION: 1. Borderline gallbladder wall thickening with trace pericholecystic fluid, nonspecific. This can be seen in the setting of systemic causes such as liver disease or cardiac failure, or primary gallbladder inflammation. No gallstones or sonographic Murphy sign. 2. No biliary dilatation. Unremarkable sonographic appearance of the liver. Electronically Signed   By: Jeb Levering M.D.   On: 10/03/2016 04:13      Subjective: No chest pain or dyspnea.  Discharge Exam: Vitals:   10/14/16 1500 10/14/16 1549  BP: (!) 112/52 (!) 165/45  Pulse: 73 78  Resp:  (!) 22  Temp:  98.2 F (36.8 C)   Vitals:   10/14/16 1400 10/14/16 1430 10/14/16 1500 10/14/16 1549  BP: (!) 124/53 (!) 110/50 (!) 112/52 (!) 165/45  Pulse: 74 74 73 78  Resp:    (!) 22  Temp:    98.2 F (36.8 C)  TempSrc:    Oral  SpO2:    97%  Weight:      Height:        General exam: Appears calm and comfortable Respiratory system: Clear to auscultation. Respiratory effort normal. Cardiovascular system: S1 & S2 heard, RRR. No murmurs. Gastrointestinal system: Abdomen is nondistended, soft and  nontender. Normal bowel sounds heard. Central nervous system: Alert and oriented. No focal neurological deficits. Extremities: No edema. No calf tenderness Skin: No cyanosis. No rashes Psychiatry: Judgement and insight appear normal. Mood & affect appropriate.   The results of significant diagnostics from this hospitalization (including imaging, microbiology, ancillary and laboratory) are listed below for reference.     Microbiology: Recent Results (from the past 240 hour(s))  Blood Culture (routine x 2)     Status: None (Preliminary result)   Collection Time: 10/10/16 12:44 AM  Result Value Ref Range Status   Specimen Description BLOOD LEFT ARM  Final   Special Requests   Final    BOTTLES DRAWN AEROBIC AND ANAEROBIC Blood Culture adequate volume   Culture NO GROWTH 4 DAYS  Final   Report Status PENDING  Incomplete  Blood Culture (routine x 2)     Status: None (Preliminary result)   Collection Time: 10/10/16  1:24 AM  Result Value Ref Range Status   Specimen Description BLOOD RIGHT ARM  Final   Special Requests IN PEDIATRIC BOTTLE Blood Culture adequate volume  Final   Culture NO GROWTH 4 DAYS  Final   Report Status PENDING  Incomplete     Labs: BNP (last 3 results)  Recent Labs  07/16/16 1517  BNP 093.2*   Basic Metabolic Panel:  Recent Labs Lab 10/09/16 2325 10/10/16 0450 10/11/16 0219 10/12/16 0445 10/14/16 0250  NA 140 140 137 136 129*  K 4.8 5.1 4.6 4.3 4.4  CL 96* 97* 97* 100* 91*  CO2 24 26 28 24 23   GLUCOSE 139* 140* 105* 102* 119*  BUN 54* 59* 30* 19 37*  CREATININE 9.96* 10.41* 6.71* 5.22* 7.01*  CALCIUM 9.7 9.2 9.3 9.3 10.0  MG  --   --   --  1.7  --    Liver Function Tests:  Recent Labs Lab 10/09/16 2325  AST 28  ALT 29  ALKPHOS 97  BILITOT 0.7  PROT 7.2  ALBUMIN 4.2   No results for input(s): LIPASE, AMYLASE in the last 168 hours. No results for input(s): AMMONIA in the last 168 hours. CBC:  Recent Labs Lab 10/09/16 2325  10/10/16 0450 10/11/16 0219 10/12/16 0445 10/13/16 0238 10/14/16 0250  WBC 12.0* 12.5* 7.7 8.3 8.1 12.6*  NEUTROABS 9.0*  --   --   --   --   --   HGB 11.7* 10.7* 8.9* 9.4* 9.3* 8.9*  HCT 37.0 34.2* 28.5* 29.4* 30.3* 27.0*  MCV 96.9 97.7 97.6 98.7 97.7 95.1  PLT 212 199 150 168 171 215   Cardiac Enzymes:  Recent Labs Lab 10/10/16 0450 10/10/16 1513 10/11/16 1056  TROPONINI 9.32* 23.73* 13.05*   BNP: Invalid input(s): POCBNP CBG: No results for input(s): GLUCAP in the last 168 hours. D-Dimer No results for input(s): DDIMER in the last 72 hours. Hgb A1c No results for input(s): HGBA1C in the last 72 hours. Lipid Profile No results for input(s): CHOL, HDL, LDLCALC, TRIG, CHOLHDL, LDLDIRECT in the last 72 hours. Thyroid function studies No results for input(s): TSH, T4TOTAL, T3FREE, THYROIDAB in the last 72 hours.  Invalid input(s): FREET3 Anemia work up No results for input(s): VITAMINB12, FOLATE, FERRITIN, TIBC, IRON, RETICCTPCT in the last 72 hours. Urinalysis    Component Value Date/Time   COLORURINE YELLOW 12/16/2012 1919   APPEARANCEUR CLOUDY (A) 12/16/2012 1919   LABSPEC 1.009 12/16/2012 1919   PHURINE 7.5 12/16/2012 1919   GLUCOSEU NEGATIVE 12/16/2012 1919   HGBUR TRACE (A) 12/16/2012 1919   BILIRUBINUR NEGATIVE 12/16/2012 1919   KETONESUR NEGATIVE 12/16/2012 1919   PROTEINUR 100 (A) 12/16/2012 1919   UROBILINOGEN 0.2 12/16/2012 1919   NITRITE NEGATIVE 12/16/2012 1919   LEUKOCYTESUR SMALL (A) 12/16/2012 1919   Sepsis Labs Invalid input(s): PROCALCITONIN,  WBC,  LACTICIDVEN Microbiology Recent Results (from the past 240 hour(s))  Blood Culture (routine x 2)     Status: None (Preliminary result)   Collection Time: 10/10/16 12:44 AM  Result Value Ref Range Status   Specimen Description BLOOD LEFT ARM  Final   Special Requests   Final    BOTTLES DRAWN AEROBIC AND ANAEROBIC Blood Culture adequate volume   Culture NO GROWTH 4 DAYS  Final   Report Status  PENDING  Incomplete  Blood Culture (routine x 2)     Status: None (Preliminary result)   Collection Time: 10/10/16  1:24 AM  Result Value Ref Range Status   Specimen Description BLOOD RIGHT ARM  Final   Special Requests IN PEDIATRIC BOTTLE Blood Culture adequate volume  Final   Culture NO GROWTH 4 DAYS  Final   Report Status PENDING  Incomplete     Time coordinating discharge: Over 30 minutes  SIGNED:   Cordelia Poche, MD [facility] 10/14/2016, 4:48 PM Pager [phone]  If 7PM-7AM, please contact night-coverage [email or web address] Password [email or web address]

## 2016-10-14 NOTE — Care Management Note (Signed)
Case Management Note  Patient Details  Name: Shawna [name] MRN: [id number] Date of Birth: Sep 19, 1940  Subjective/Objective:  From home with spouse,  Uses a cane at home , he PCP is Dr. Nadara Mustard, she has medication coverage and transportation at dc.  s/p coronary stent intervention, will be on brilinta , NCM gave her the 30 day savings card, she will go to [facility] in [place] to get 30 day free and they have in stock.  Patient has had HHPT with [facility] before but her insurance would not give them any more approved visits.  She is a HD patient - M,W, F,  NCM will cont to follow for dc needs.                                Action/Plan:   Expected Discharge Date:                  Expected Discharge Plan:  Home/Self Care  In-House Referral:     Discharge planning Services  CM Consult  Post Acute Care Choice:    Choice offered to:     DME Arranged:    DME Agency:     HH Arranged:    HH Agency:     Status of Service:  Completed, signed off  If discussed at [employer] of Stay Meetings, dates discussed:    Additional Comments:  Zenon Mayo, RN 10/14/2016, 3:17 PM

## 2016-10-14 NOTE — Discharge Instructions (Addendum)
Shawna [name],  You were admitted because of your chest pain. You had a stent placed and have been started on new medication, Brillinta, that you MUST take to keep that stent open. Please do not miss any doses. Please follow-up with your cardiologist and primary care physician.    Coronary Angiogram With Stent, Care After This sheet gives you information about how to care for yourself after your procedure. Your health care provider may also give you more specific instructions. If you have problems or questions, contact your health care provider. What can I expect after the procedure? After your procedure, it is common to have:  Bruising in the area where a small, thin tube (catheter) was inserted. This usually fades within 1-2 weeks.  Blood collecting in the tissue (hematoma) that may be painful to the touch. It should usually decrease in size and tenderness within 1-2 weeks. Follow these instructions at home: Insertion area care   Do not take baths, swim, or use a hot tub until your health care provider approves.  You may shower 24-48 hours after the procedure or as directed by your health care provider.  Follow instructions from your health care provider about how to take care of your incision. Make sure you:  Wash your hands with soap and water before you change your bandage (dressing). If soap and water are not available, use hand sanitizer.  Change your dressing as told by your health care provider.  Leave stitches (sutures), skin glue, or adhesive strips in place. These skin closures may need to stay in place for 2 weeks or longer. If adhesive strip edges start to loosen and curl up, you may trim the loose edges. Do not remove adhesive strips completely unless your health care provider tells you to do that.  Remove the bandage (dressing) and gently wash the catheter insertion site with plain soap and water.  Pat the area dry with a clean towel. Do not rub the area, because that  may cause bleeding.  Do not apply powder or lotion to the incision area.  Check your incision area every day for signs of infection. Check for:  More redness, swelling, or pain.  More fluid or blood.  Warmth.  Pus or a bad smell. Activity   Do not drive for 24 hours if you were given a medicine to help you relax (sedative).  Do not lift anything that is heavier than 10 lb (4.5 kg) for 5 days after your procedure or as directed by your health care provider.  Ask your health care provider when it is okay for you:  To return to work or school.  To resume usual physical activities or sports.  To resume sexual activity. Eating and drinking   Eat a heart-healthy diet. This should include plenty of fresh fruits and vegetables.  Avoid the following types of food:  Food that is high in salt.  Canned or highly processed food.  Food that is high in saturated fat or sugar.  Fried food.  Limit alcohol intake to no more than 1 drink a day for non-pregnant women and 2 drinks a day for men. One drink equals 12 oz of beer, 5 oz of wine, or 1 oz of hard liquor. Lifestyle   Do not use any products that contain nicotine or tobacco, such as cigarettes and e-cigarettes. If you need help quitting, ask your health care provider.  Take steps to manage and control your weight.  Get regular exercise.  Manage your  blood pressure.  Manage other health problems, such as diabetes. General instructions   Take over-the-counter and prescription medicines only as told by your health care provider. Blood thinners may be prescribed after your procedure to improve blood flow through the stent.  If you need an MRI after your heart stent has been placed, be sure to tell the health care provider who orders the MRI that you have a heart stent.  Keep all follow-up visits as directed by your health care provider. This is important. Contact a health care provider if:  You have a fever.  You have  chills.  You have increased bleeding from the catheter insertion area. Hold pressure on the area. Get help right away if:  You develop chest pain or shortness of breath.  You feel faint or you pass out.  You have unusual pain at the catheter insertion area.  You have redness, warmth, or swelling at the catheter insertion area.  You have drainage (other than a small amount of blood on the dressing) from the catheter insertion area.  The catheter insertion area is bleeding, and the bleeding does not stop after 30 minutes of holding steady pressure on the area.  You develop bleeding from any other place, such as from your rectum. There may be bright red blood in your urine or stool, or it may appear as black, tarry stool. This information is not intended to replace advice given to you by your health care provider. Make sure you discuss any questions you have with your health care provider. Document Released: 01/14/2005 Document Revised: 03/24/2016 Document Reviewed: 03/24/2016 Elsevier Interactive Patient Education  2017 [employer].

## 2016-10-14 NOTE — Progress Notes (Signed)
[phone] Pt just walked with PT so did not walk. Reviewed with pt and husband many times how important it is to take brilinta with stent. She has brilinta card. Answered questions re taking brilinta card with prescription to [facility]. Discussed with RN to re enforce on discharge. Reviewed NTG use, walking as tolerated when she feels well. Pt stated she usually tolerates dialysis well. Discussed CRP 2 [place] and will refer. Pt stated may be able to go before dialysis. Will let Register follow up. Will refer diet questions to HD dietician since pt on renal diet.  Graylon Good RN BSN 10/14/2016 10:25 AM

## 2016-10-14 NOTE — Progress Notes (Signed)
The patient has been seen in conjunction with Vin Bhagat, PA-C. All aspects of care have been considered and discussed. The patient has been personally interviewed, examined, and all clinical data has been reviewed.   Progression of diagonal disease compare with one month ago and with associated decreased flow, TIMI 2. PCI with excellent angiographic result by Dr. Claiborne Billings with new DES placed overlapping with old stent.  Will need dual antiplatelet therapy for 6 months. She has a tendency towards GI bleeding, which will need to be monitored closely.  Discharge today after dialysis.  Needs two-week follow-up with her primary cardiologist, Dr. Court Joy.   Progress Note  Patient Name: Shawna [name] Date of Encounter: 10/14/2016  Primary Cardiologist: Dr. Bronson Ing  Subjective   Feeling well. No chest pain, sob or palpitations.   Inpatient Medications    Scheduled Meds: . amiodarone  200 mg Oral Daily  . amLODipine  2.5 mg Oral QHS  . aspirin  81 mg Oral Daily  . atorvastatin  80 mg Oral q1800  . carvedilol  3.125 mg Oral BID WC  . cinacalcet  30 mg Oral Q supper  . cloNIDine  0.2 mg Oral BID  . doxercalciferol  7 mcg Intravenous Q M,W,F-HD  . doxycycline  100 mg Oral Q12H  . hydrALAZINE  75 mg Oral Q8H  . isosorbide mononitrate  120 mg Oral Daily  . lanthanum  1,000 mg Oral TID PC  . multivitamin  1 tablet Oral QHS  . pantoprazole  40 mg Oral Daily  . sodium chloride flush  3 mL Intravenous Q12H  . sodium chloride flush  3 mL Intravenous Q12H  . ticagrelor  90 mg Oral BID   Continuous Infusions:  PRN Meds: sodium chloride, acetaminophen, albuterol, ALPRAZolam, nitroGLYCERIN, ondansetron **OR** ondansetron (ZOFRAN) IV, ondansetron (ZOFRAN) IV, senna-docusate, sodium chloride flush   Vital Signs    Vitals:   10/14/16 0211 10/14/16 0328 10/14/16 0358 10/14/16 0737  BP: (!) 198/82 (!) 177/58 (!) 152/84 (!) 180/66  Pulse:  82  80  Resp: (!) 21 (!) 28  (!) 27  Temp:   97.9 F (36.6 C)  98.6 F (37 C)  TempSrc:  Axillary  Oral  SpO2:  95%  96%  Weight:  133 lb 1.6 oz (60.4 kg)    Height:        Intake/Output Summary (Last 24 hours) at 10/14/16 0810 Last data filed at 10/14/16 0600  Gross per 24 hour  Intake              680 ml  Output                0 ml  Net              680 ml   Filed Weights   10/12/16 1842 10/12/16 2142 10/14/16 0328  Weight: 126 lb 5.2 oz (57.3 kg) 121 lb 14.6 oz (55.3 kg) 133 lb 1.6 oz (60.4 kg)    Telemetry    Sinus rhythm at rate of 80-90s- Personally Reviewed  ECG    Sinus rhythm with LVH and LBBB - Personally Reviewed  Physical Exam   GEN: No acute distress.   Neck: No JVD Cardiac: RRR, no murmurs, rubs, or gallops. R groin cath site without hematoma Respiratory: Clear to auscultation bilaterally. GI: Soft, nontender, non-distended  MS: No edema; No deformity. Neuro:  Nonfocal  Psych: Normal affect   Labs    Chemistry Recent Labs Lab 10/09/16 2325  10/11/16  9371 10/12/16 0445 10/14/16 0250  NA 140  < > 137 136 129*  K 4.8  < > 4.6 4.3 4.4  CL 96*  < > 97* 100* 91*  CO2 24  < > 28 24 23   GLUCOSE 139*  < > 105* 102* 119*  BUN 54*  < > 30* 19 37*  CREATININE 9.96*  < > 6.71* 5.22* 7.01*  CALCIUM 9.7  < > 9.3 9.3 10.0  PROT 7.2  --   --   --   --   ALBUMIN 4.2  --   --   --   --   AST 28  --   --   --   --   ALT 29  --   --   --   --   ALKPHOS 97  --   --   --   --   BILITOT 0.7  --   --   --   --   GFRNONAA 3*  < > 5* 7* 5*  GFRAA 4*  < > 6* 8* 6*  ANIONGAP 20*  < > 12 12 15   < > = values in this interval not displayed.   Hematology Recent Labs Lab 10/12/16 0445 10/13/16 0238 10/14/16 0250  WBC 8.3 8.1 12.6*  RBC 2.98* 3.10* 2.84*  HGB 9.4* 9.3* 8.9*  HCT 29.4* 30.3* 27.0*  MCV 98.7 97.7 95.1  MCH 31.5 30.0 31.3  MCHC 32.0 30.7 33.0  RDW 17.6* 17.0* 17.5*  PLT 168 171 215    Cardiac Enzymes Recent Labs Lab 10/10/16 0450 10/10/16 1513 10/11/16 1056  TROPONINI 9.32*  23.73* 13.05*    Recent Labs Lab 10/09/16 2335  TROPIPOC 0.06     BNPNo results for input(s): BNP, PROBNP in the last 168 hours.   DDimer No results for input(s): DDIMER in the last 168 hours.   Radiology    Dg Chest Port 1 View  Result Date: 10/12/2016 CLINICAL DATA:  Pulmonary edema, chest pain last night EXAM: PORTABLE CHEST 1 VIEW COMPARISON:  10/10/2016 FINDINGS: Cardiomegaly again noted. Status post CABG. Double lumen right IJ catheter is unchanged in position. Improvement in aeration without convincing pulmonary edema. Persistent trace bilateral pleural effusion with bilateral basilar atelectasis or infiltrate. IMPRESSION: Status post CABG. Double lumen right IJ catheter is unchanged in position. Improvement in aeration without convincing pulmonary edema. Persistent trace bilateral pleural effusion with bilateral basilar atelectasis or infiltrate. Electronically Signed   By: Lahoma Crocker M.D.   On: 10/12/2016 08:50    Cardiac Studies   Coronary Stent Intervention  Left Heart Cath and Cors/Grafts Angiography  Conclusion     Ramus lesion, 90 %stenosed.  Dist LAD lesion, 80 %stenosed.  LIMA.  SVG.  Ost RCA to Prox RCA lesion, 100 %stenosed.  SVG.  Ost Cx to Prox Cx lesion, 90 %stenosed.  Mid LAD lesion, 75 %stenosed.  A STENT RESOLUTE ONYX 6.96V89 drug eluting stent was successfully placed, and overlaps previously placed stent.  Ost 1st Diag to 1st Diag lesion, 99 %stenosed.  Post intervention, there is a 0% residual stenosis.  1st Diag lesion, 20 %stenosed.   Severe multivessel native CAD with 99.9% ostial proximal diagonal 1 stenosis in a vessel which had been previously stented and now has TIMI 1 flow, 85% LAD stenosis immediately after the takeoff of this first diagonal vessel and 80% mid LAD stenosis just proximal to the insertion of the LIMA graft.  Ramus intermediate 90% ostial stenosis with competitive filling due a  patent SVG.  Ostial Left  circumflex 90% in-stent restenosis and occlusion of a marginal branch beyond the stented segment.  Total occlusion of a stent at the ostium of the RCA.  Patent LIMA graft supplying the mid distal LAD with insertion beyond the mid 80% stenosis.  Patent SVG supplying the ramus intermediate vessel with 30% narrowing just beyond the anastomosis.  Patent SVG supplying the distal RCA which fills the entire RCA distally and antegrade up to the occluded ostial stent.  Successful percutaneous coronary intervention to the subtotally occluded diagonal vessel with the 99.9%  stenosis being reduced to 0% with PTCA and insertion of a 2.2515 mm Resolute Onyx DES stent into the previously placed stent post dilated to 2.45 mm and with the stenosis being reduced to 0% in the entire stented segment.  The sluggish TIMI-1 flow at the start of the procedure was improved to brisk TIMI-3 flow in the diagonal vessel following PCI. The LAD stenosis immediately after the diagonal vessel was 80% stenosed at the completion of the procedure.  RECOMMENDATION: Brillinta was used for antiplatelet therapy during this procedure due to the patient's reported rash to Plavix.  She will need to continue dual antiplatelet therapy.  Medical therapy for concomitant CAD.   Echo 10/12/16 Study Conclusions  - Left ventricle: The cavity size was normal. There was moderate   concentric hypertrophy. Systolic function was normal. The   estimated ejection fraction was in the range of 50% to 55%. Wall   motion was normal; there were no regional wall motion   abnormalities. Doppler parameters are consistent with abnormal   left ventricular relaxation (grade 1 diastolic dysfunction).   Doppler parameters are consistent with elevated ventricular   end-diastolic filling pressure. - Aortic valve: Trileaflet; normal thickness leaflets. There was no   regurgitation. - Aortic root: The aortic root was normal in size. - Mitral valve:  Calcified annulus. Mildly thickened leaflets .   There was mild regurgitation. - Left atrium: The atrium was normal in size. - Right ventricle: Systolic function was normal. - Tricuspid valve: There was no regurgitation. - Pulmonary arteries: Systolic pressure could not be accurately   estimated. - Inferior vena cava: The vessel was normal in size. - Pericardium, extracardiac: There was no pericardial effusion.  Patient Profile     76 y.o. female [place] of CAD s/p CABG with recent NSTEMI, HTN, GI bleed from AVM and ESRD on HD who presented with chest pain and elevated troponin.  Assessment & Plan    1. CAD, previous CABG, and known in-stent restenosis of a moderate size diagonal - S/p PTCA and insertion of a 2.2515 mm Resolute Onyx DES to subtotally occluded diagonal into the previously placed stent post dilated to 2.45 mm and with the stenosis being reduced to 0% in the entire stented segment.  - Continue ASA, Brilinta, Imdur, BB and statin.   2.HTN - BP elevated at time. Managed by primary team. Consider up titration as needed.    Signed, Leanor Kail, PA  10/14/2016, 8:10 AM

## 2016-10-15 LAB — CULTURE, BLOOD (ROUTINE X 2)
CULTURE: NO GROWTH
Culture: NO GROWTH
SPECIAL REQUESTS: ADEQUATE
SPECIAL REQUESTS: ADEQUATE

## 2016-10-17 ENCOUNTER — Telehealth: Payer: Self-pay | Admitting: Vascular Surgery

## 2016-10-17 DIAGNOSIS — Z992 Dependence on renal dialysis: Secondary | ICD-10-CM | POA: Diagnosis not present

## 2016-10-17 DIAGNOSIS — N186 End stage renal disease: Secondary | ICD-10-CM | POA: Diagnosis not present

## 2016-10-17 NOTE — Telephone Encounter (Signed)
-----   Message from Gregery Na, RN sent at 10/17/2016 12:39 PM EDT ----- Regarding: OV with CEF Please schedule patient an office visit with Dr. Oneida Alar to discuss dialysis access?  Thanks, Colletta [place] ----- Message ----- From: Elam Dutch, MD Sent: 10/17/2016  12:38 PM To: Gregery Na, RN  See her in office first  Juanda Crumble  ----- Message ----- From: Gregery Na, RN Sent: 10/17/2016  11:42 AM To: Elam Dutch, MD  Ramiro Harvest, PA called regarding rescheduling Ms. Friedli's procedure. She was scheduled for Lig. (L) arm AVF; New (L) arm AVG on 10/03/16 but this was cancelled. Ms. Erkkila had a heart cath by Dr. Claiborne Billings on 10/13/16 with a DES and was placed on Brilinta. Do you want me to reschedule Mr. Sorto's procedure while on Aspirin and Brilinta? Per Dr. Evette Georges note, she must remain on dual antiplatelet therapy for 6 months.   Thanks,  Colletta [place]

## 2016-10-17 NOTE — Telephone Encounter (Signed)
Sched appt 4/12/18n at 11:30. Lm on hm# to inform pt and for them to call back to confirm.

## 2016-10-19 DIAGNOSIS — N186 End stage renal disease: Secondary | ICD-10-CM | POA: Diagnosis not present

## 2016-10-19 DIAGNOSIS — Z992 Dependence on renal dialysis: Secondary | ICD-10-CM | POA: Diagnosis not present

## 2016-10-20 ENCOUNTER — Ambulatory Visit: Payer: Medicare HMO | Admitting: Vascular Surgery

## 2016-10-20 LAB — POCT ACTIVATED CLOTTING TIME
Activated Clotting Time: 208 seconds
Activated Clotting Time: 186 seconds
Activated Clotting Time: 175 seconds

## 2016-10-21 DIAGNOSIS — N186 End stage renal disease: Secondary | ICD-10-CM | POA: Diagnosis not present

## 2016-10-21 DIAGNOSIS — Z992 Dependence on renal dialysis: Secondary | ICD-10-CM | POA: Diagnosis not present

## 2016-10-22 DIAGNOSIS — N186 End stage renal disease: Secondary | ICD-10-CM | POA: Diagnosis not present

## 2016-10-22 DIAGNOSIS — E8779 Other fluid overload: Secondary | ICD-10-CM | POA: Diagnosis not present

## 2016-10-22 DIAGNOSIS — Z992 Dependence on renal dialysis: Secondary | ICD-10-CM | POA: Diagnosis not present

## 2016-10-24 ENCOUNTER — Observation Stay (HOSPITAL_COMMUNITY)
Admission: EM | Admit: 2016-10-24 | Discharge: 2016-10-25 | Disposition: A | Discharge status: Home / Self Care | Payer: Medicare HMO | Attending: Family Medicine | Admitting: Family Medicine

## 2016-10-24 ENCOUNTER — Encounter (HOSPITAL_COMMUNITY): Payer: Self-pay | Admitting: Emergency Medicine

## 2016-10-24 DIAGNOSIS — Z79899 Other long term (current) drug therapy: Secondary | ICD-10-CM | POA: Insufficient documentation

## 2016-10-24 DIAGNOSIS — Z955 Presence of coronary angioplasty implant and graft: Secondary | ICD-10-CM | POA: Insufficient documentation

## 2016-10-24 DIAGNOSIS — N186 End stage renal disease: Secondary | ICD-10-CM | POA: Insufficient documentation

## 2016-10-24 DIAGNOSIS — Z8673 Personal history of transient ischemic attack (TIA), and cerebral infarction without residual deficits: Secondary | ICD-10-CM | POA: Diagnosis not present

## 2016-10-24 DIAGNOSIS — Z7982 Long term (current) use of aspirin: Secondary | ICD-10-CM | POA: Insufficient documentation

## 2016-10-24 DIAGNOSIS — K219 Gastro-esophageal reflux disease without esophagitis: Secondary | ICD-10-CM | POA: Diagnosis present

## 2016-10-24 DIAGNOSIS — I2510 Atherosclerotic heart disease of native coronary artery without angina pectoris: Secondary | ICD-10-CM | POA: Diagnosis present

## 2016-10-24 DIAGNOSIS — Z992 Dependence on renal dialysis: Secondary | ICD-10-CM | POA: Diagnosis not present

## 2016-10-24 DIAGNOSIS — I252 Old myocardial infarction: Secondary | ICD-10-CM

## 2016-10-24 DIAGNOSIS — Z951 Presence of aortocoronary bypass graft: Secondary | ICD-10-CM | POA: Diagnosis not present

## 2016-10-24 DIAGNOSIS — K558 Other vascular disorders of intestine: Secondary | ICD-10-CM | POA: Diagnosis not present

## 2016-10-24 DIAGNOSIS — D649 Anemia, unspecified: Secondary | ICD-10-CM | POA: Diagnosis not present

## 2016-10-24 DIAGNOSIS — I25110 Atherosclerotic heart disease of native coronary artery with unstable angina pectoris: Secondary | ICD-10-CM | POA: Insufficient documentation

## 2016-10-24 DIAGNOSIS — E782 Mixed hyperlipidemia: Secondary | ICD-10-CM | POA: Diagnosis present

## 2016-10-24 DIAGNOSIS — I132 Hypertensive heart and chronic kidney disease with heart failure and with stage 5 chronic kidney disease, or end stage renal disease: Secondary | ICD-10-CM | POA: Diagnosis not present

## 2016-10-24 DIAGNOSIS — I5042 Chronic combined systolic (congestive) and diastolic (congestive) heart failure: Secondary | ICD-10-CM | POA: Diagnosis present

## 2016-10-24 DIAGNOSIS — K5520 Angiodysplasia of colon without hemorrhage: Secondary | ICD-10-CM | POA: Diagnosis present

## 2016-10-24 DIAGNOSIS — K5521 Angiodysplasia of colon with hemorrhage: Secondary | ICD-10-CM | POA: Diagnosis present

## 2016-10-24 DIAGNOSIS — I5022 Chronic systolic (congestive) heart failure: Secondary | ICD-10-CM | POA: Diagnosis present

## 2016-10-24 DIAGNOSIS — I5033 Acute on chronic diastolic (congestive) heart failure: Secondary | ICD-10-CM | POA: Diagnosis not present

## 2016-10-24 DIAGNOSIS — E785 Hyperlipidemia, unspecified: Secondary | ICD-10-CM | POA: Diagnosis present

## 2016-10-24 DIAGNOSIS — I5032 Chronic diastolic (congestive) heart failure: Secondary | ICD-10-CM | POA: Diagnosis present

## 2016-10-24 LAB — PREPARE RBC (CROSSMATCH)

## 2016-10-24 LAB — COMPREHENSIVE METABOLIC PANEL
ALBUMIN: 3 g/dL — AB (ref 3.5–5.0)
ALT: 31 U/L (ref 14–54)
AST: 33 U/L (ref 15–41)
Alkaline Phosphatase: 63 U/L (ref 38–126)
Anion gap: 11 (ref 5–15)
BUN: 9 mg/dL (ref 6–20)
CHLORIDE: 92 mmol/L — AB (ref 101–111)
CO2: 29 mmol/L (ref 22–32)
Calcium: 8.6 mg/dL — ABNORMAL LOW (ref 8.9–10.3)
Creatinine, Ser: 2.88 mg/dL — ABNORMAL HIGH (ref 0.44–1.00)
GFR calc Af Amer: 17 mL/min — ABNORMAL LOW (ref >60)
GFR, EST NON AFRICAN AMERICAN: 15 mL/min — AB (ref >60)
Glucose, Bld: 121 mg/dL — ABNORMAL HIGH (ref 65–99)
POTASSIUM: 3.1 mmol/L — AB (ref 3.5–5.1)
Sodium: 132 mmol/L — ABNORMAL LOW (ref 135–145)
Total Bilirubin: 0.3 mg/dL (ref 0.3–1.2)
Total Protein: 6 g/dL — ABNORMAL LOW (ref 6.5–8.1)

## 2016-10-24 LAB — CBC WITH DIFFERENTIAL/PLATELET
BASOS ABS: 0.1 10*3/uL (ref 0.0–0.1)
BASOS PCT: 1 %
EOS PCT: 5 %
Eosinophils Absolute: 0.4 10*3/uL (ref 0.0–0.7)
HCT: 22.6 % — ABNORMAL LOW (ref 36.0–46.0)
Hemoglobin: 7.3 g/dL — ABNORMAL LOW (ref 12.0–15.0)
Lymphocytes Relative: 13 %
Lymphs Abs: 1 10*3/uL (ref 0.7–4.0)
MCH: 30.9 pg (ref 26.0–34.0)
MCHC: 32.3 g/dL (ref 30.0–36.0)
MCV: 95.8 fL (ref 78.0–100.0)
MONO ABS: 0.4 10*3/uL (ref 0.1–1.0)
Monocytes Relative: 6 %
Neutro Abs: 5.8 10*3/uL (ref 1.7–7.7)
Neutrophils Relative %: 75 %
PLATELETS: 304 10*3/uL (ref 150–400)
RBC: 2.36 MIL/uL — ABNORMAL LOW (ref 3.87–5.11)
RDW: 16.8 % — AB (ref 11.5–15.5)
WBC: 7.7 10*3/uL (ref 4.0–10.5)

## 2016-10-24 MED ORDER — CINACALCET HCL 30 MG PO TABS: 30.0000 mg | ORAL_TABLET | Freq: Every day | ORAL | Status: DC

## 2016-10-24 MED ORDER — ATORVASTATIN CALCIUM 40 MG PO TABS: 40.0000 mg | ORAL_TABLET | Freq: Every day | ORAL | Status: DC

## 2016-10-24 MED ORDER — HYDRALAZINE HCL 50 MG PO TABS
75.0000 mg | ORAL_TABLET | Freq: Three times a day (TID) | ORAL | Status: DC
  Administered 2016-10-24 – 2016-10-25 (×2): 75 mg via ORAL
  Filled 2016-10-24 (×2): qty 2

## 2016-10-24 MED ORDER — ONDANSETRON HCL 4 MG/2ML IJ SOLN: 4.0000 mg | Freq: Four times a day (QID) | INTRAMUSCULAR | Status: DC | PRN

## 2016-10-24 MED ORDER — CARVEDILOL 12.5 MG PO TABS
6.2500 mg | ORAL_TABLET | Freq: Two times a day (BID) | ORAL | Status: DC
  Administered 2016-10-25: 6.25 mg via ORAL
  Filled 2016-10-24: qty 1

## 2016-10-24 MED ORDER — AMLODIPINE BESYLATE 5 MG PO TABS
2.5000 mg | ORAL_TABLET | Freq: Every day | ORAL | Status: DC
  Administered 2016-10-25: 2.5 mg via ORAL
  Filled 2016-10-24: qty 1

## 2016-10-24 MED ORDER — OXYCODONE-ACETAMINOPHEN 5-325 MG PO TABS: 1.0000 | ORAL_TABLET | Freq: Four times a day (QID) | ORAL | Status: DC | PRN

## 2016-10-24 MED ORDER — SODIUM CHLORIDE 0.9% FLUSH
3.0000 mL | Freq: Two times a day (BID) | INTRAVENOUS | Status: DC
  Administered 2016-10-24: 3 mL via INTRAVENOUS

## 2016-10-24 MED ORDER — HYDRALAZINE HCL 20 MG/ML IJ SOLN: 5.0000 mg | Freq: Once | INTRAMUSCULAR | Status: DC

## 2016-10-24 MED ORDER — DIPHENHYDRAMINE HCL 25 MG PO CAPS
25.0000 mg | ORAL_CAPSULE | Freq: Once | ORAL | Status: AC
  Administered 2016-10-24: 25 mg via ORAL
  Filled 2016-10-24: qty 1

## 2016-10-24 MED ORDER — ALPRAZOLAM 0.25 MG PO TABS: 0.2500 mg | ORAL_TABLET | Freq: Two times a day (BID) | ORAL | Status: DC | PRN

## 2016-10-24 MED ORDER — ASPIRIN EC 81 MG PO TBEC
81.0000 mg | DELAYED_RELEASE_TABLET | Freq: Every day | ORAL | Status: DC
  Administered 2016-10-25: 81 mg via ORAL
  Filled 2016-10-24: qty 1

## 2016-10-24 MED ORDER — ISOSORBIDE MONONITRATE ER 30 MG PO TB24
120.0000 mg | ORAL_TABLET | Freq: Every day | ORAL | Status: DC
  Administered 2016-10-25: 120 mg via ORAL
  Filled 2016-10-24: qty 4

## 2016-10-24 MED ORDER — NITROGLYCERIN 0.4 MG SL SUBL: 0.4000 mg | SUBLINGUAL_TABLET | SUBLINGUAL | Status: DC | PRN

## 2016-10-24 MED ORDER — LANTHANUM CARBONATE 500 MG PO CHEW
1000.0000 mg | CHEWABLE_TABLET | Freq: Three times a day (TID) | ORAL | Status: DC
  Administered 2016-10-25: 1000 mg via ORAL
  Filled 2016-10-24: qty 2

## 2016-10-24 MED ORDER — CLONIDINE HCL 0.2 MG PO TABS
0.2000 mg | ORAL_TABLET | Freq: Two times a day (BID) | ORAL | Status: DC
  Administered 2016-10-24 – 2016-10-25 (×2): 0.2 mg via ORAL
  Filled 2016-10-24 (×2): qty 1

## 2016-10-24 MED ORDER — TICAGRELOR 90 MG PO TABS
90.0000 mg | ORAL_TABLET | Freq: Two times a day (BID) | ORAL | Status: DC
  Administered 2016-10-24 – 2016-10-25 (×2): 90 mg via ORAL
  Filled 2016-10-24 (×2): qty 1

## 2016-10-24 MED ORDER — AMIODARONE HCL 200 MG PO TABS
200.0000 mg | ORAL_TABLET | Freq: Every day | ORAL | Status: DC
  Administered 2016-10-25: 200 mg via ORAL
  Filled 2016-10-24: qty 1

## 2016-10-24 MED ORDER — ONDANSETRON HCL 4 MG PO TABS: 4.0000 mg | ORAL_TABLET | Freq: Four times a day (QID) | ORAL | Status: DC | PRN

## 2016-10-24 MED ORDER — SODIUM CHLORIDE 0.9 % IV SOLN
Freq: Once | INTRAVENOUS | Status: AC
  Administered 2016-10-24: 23:00:00 via INTRAVENOUS

## 2016-10-24 MED ORDER — RENA-VITE PO TABS
1.0000 | ORAL_TABLET | Freq: Every day | ORAL | Status: DC
  Administered 2016-10-24: 1 via ORAL
  Filled 2016-10-24: qty 1

## 2016-10-24 MED ORDER — PANTOPRAZOLE SODIUM 40 MG PO TBEC
40.0000 mg | DELAYED_RELEASE_TABLET | Freq: Every day | ORAL | Status: DC
  Administered 2016-10-25: 40 mg via ORAL
  Filled 2016-10-24: qty 1

## 2016-10-24 MED ORDER — POTASSIUM CHLORIDE CRYS ER 20 MEQ PO TBCR
20.0000 meq | EXTENDED_RELEASE_TABLET | Freq: Once | ORAL | Status: AC
  Administered 2016-10-24: 20 meq via ORAL
  Filled 2016-10-24: qty 1

## 2016-10-24 NOTE — H&P (Signed)
History and Physical    Shawna [name] YIR:[id number] DOB: 1941/05/04 DOA: 10/24/2016  PCP: [name], MD   Patient coming from: HD unit  Chief Complaint: Referred to ED for blood transfusion for Hgb of 6.3  HPI: Shawna [name] is a 76 y.o. woman with a history of ESRD on HD MWF, CAD S/P CABG S/P multiple prior stents, recent NSTEMI with new DES to a 1st diagonal lesion (admitted here 4/1-4/6, on baby aspirin and Brilinta), history of GI bleeding secondary to AVMs, paroxysmal atrial fibrillation (CHADS-Vasc score of 6, presumably not anticoagulated due to history of GI bleeding and anemia), chronic diastolic heart failure, HTN, and ovarian and colon cancers who was referred to the ED from her HD unit for management of anemia.  Reportedly, her Hgb was 6.3 based on the labs at the HD unit.  In the ED, she has a hgb of 7.3  She has had increased weakness and fatigue since her discharge 10 days ago.  She has had intermittent palpitations.  No chest pain.  No SOB.  No light-headedness.  She has been taking her medications a prescribed.  No hematemesis or hemoptysis.  No coffee ground emesis.  No BRBPR.  No melena.  ED Course: Hgb 7.3; it was 9 almost two weeks ago.  Sodium 132.  Potassium 3.1.  Creatinine 2.88.  Type and screen sent in the ED.  Due to her cardiac history, transfusion is warranted for symptomatic anemia.  Hospitalist asked to place in observation for blood transfusion.  Review of Systems: As per HPI otherwise 10 systems reviewed and negative.   Past Medical History:  Diagnosis Date  . Acute on chronic respiratory failure with hypoxia ([place]) 10/10/2016  . Anxiety   . Arthritis   . AVM (arteriovenous malformation) of colon   . CAD (coronary artery disease)    a. 12/2011 NSTEMI/Cath/PCI LCX (2.25x14 Resolute DES) & D1 (2.25x22 Resolute DES);  b. 01/2012 Cath/PCI: LM 30, LAD 30p, 40-18m, D1 stent ok, 99 in sm branch of diag, LCX patent stent, OM1 20, RCA 95 ost (4.0x12 Promus DES),  EF 55%;  c. 04/2012 Lexi Cardiolite  EF 48%, small area of scar @ base/mid inflat wall with mild peri-infarct ischemia.; CABG 12/4  . Carotid artery disease ([place])    a. 50-09% LICA, 09/8180   . Chronic bronchitis ([place])   . Chronic diastolic CHF (congestive heart failure) ([place])    a. 02/2012 Echo EF 60-65%, nl wall motion, Gr 1 DD, mod MR  . Colon cancer (Shawna [name]) 1992  . Esophageal stricture   . ESRD on hemodialysis ([place])    ESRD due to HTN, started dialysis 2011 and gets HD at [facility] with Dr Shawna [name] on MWF schedule.  Access is LUA AVF as of Sept 2014.   Marland Kitchen GERD (gastroesophageal reflux disease)   . High cholesterol 12/2011  . History of blood transfusion 07/2011; 12/2011; 01/2012 X 2; 04/2012  . History of gout   . History of lower GI bleeding   . Hypertension   . Iron deficiency anemia   . Mitral regurgitation    a. Moderate by echo, 02/2012  . Myocardial infarction (Shawna [name])   . Ovarian cancer (Shawna [name]) 1992  . Pneumonia ~ 2009  . PUD (peptic ulcer disease)   . TIA (transient ischemic attack)     Past Surgical History:  Procedure Laterality Date  . ABDOMINAL HYSTERECTOMY  1992  . APPENDECTOMY  06/1990  . AV FISTULA PLACEMENT  07/2009   left  upper arm  . AV FISTULA PLACEMENT Right 09/06/2016   Procedure: RIGHT FOREARM ARTERIOVENOUS (AV) GRAFT;  Surgeon: Elam Dutch, MD;  Location: [place];  Service: Vascular;  Laterality: Right;  . [place] REMOVAL Right 09/06/2016   Procedure: REMOVAL OF Right Arm ARTERIOVENOUS GORETEX GRAFT and Vein Patch angioplasty of brachial artery;  Surgeon: Angelia Mould, MD;  Location: [place];  Service: Vascular;  Laterality: Right;  . COLON RESECTION  1992  . CORONARY ANGIOPLASTY WITH STENT PLACEMENT  12/15/11   "2"  . CORONARY ANGIOPLASTY WITH STENT PLACEMENT  y/2013   "1; makes total of 3" (05/02/2012)  . CORONARY ARTERY BYPASS GRAFT  06/13/2012   Procedure: CORONARY ARTERY BYPASS GRAFTING (CABG);  Surgeon: Shawna Isaac, MD;  Location: [place];  Service:  Open Heart Surgery;  Laterality: N/A;  cabg x four;  using left internal mammary artery, and left leg greater saphenous vein harvested endoscopically  . CORONARY STENT INTERVENTION N/A 10/13/2016   Procedure: Coronary Stent Intervention;  Surgeon: Troy Sine, MD;  Location: [place] CV LAB;  Service: Cardiovascular;  Laterality: N/A;  . DILATION AND CURETTAGE OF UTERUS    . ESOPHAGOGASTRODUODENOSCOPY  01/20/2012   Procedure: ESOPHAGOGASTRODUODENOSCOPY (EGD);  Surgeon: Shawna Artist, MD,FACG;  Location[facility] ENDOSCOPY;  Service: Endoscopy;  Laterality: N/A;  . ESOPHAGOGASTRODUODENOSCOPY N/A 03/26/2013   Procedure: ESOPHAGOGASTRODUODENOSCOPY (EGD);  Surgeon: Shawna Shipper, MD;  Location: [facility] ENDOSCOPY;  Service: Endoscopy;  Laterality: N/A;  . ESOPHAGOGASTRODUODENOSCOPY N/A 04/30/2015   Procedure: ESOPHAGOGASTRODUODENOSCOPY (EGD);  Surgeon: Shawna Houston, MD;  [facility];  Service: Endoscopy;  Laterality: N/A;  1pm - moved to 10/20 @ 1:10  . ESOPHAGOGASTRODUODENOSCOPY N/A 07/29/2016   Procedure: ESOPHAGOGASTRODUODENOSCOPY (EGD);  Surgeon: Shawna Gunning, MD;  Location: [place];  Service: Gastroenterology;  Laterality: N/A;  enteroscopy  . INTRAOPERATIVE TRANSESOPHAGEAL ECHOCARDIOGRAM  06/13/2012   Procedure: INTRAOPERATIVE TRANSESOPHAGEAL ECHOCARDIOGRAM;  Surgeon: Shawna Isaac, MD;  Location: [place];  Service: Open Heart Surgery;  Laterality: N/A;  . IR GENERIC HISTORICAL  07/26/2016   IR FLUORO GUIDE CV LINE RIGHT 07/26/2016 Greggory Keen, MD [facility]  . IR GENERIC HISTORICAL  07/26/2016   IR US GUIDE VASC ACCESS RIGHT 07/26/2016 Greggory Keen, MD [facility]  . IR GENERIC HISTORICAL  08/02/2016   IR US GUIDE VASC ACCESS RIGHT 08/02/2016 Greggory Keen, MD [facility]  . IR GENERIC HISTORICAL  08/02/2016   IR FLUORO GUIDE CV LINE RIGHT 08/02/2016 Greggory Keen, MD [facility]  . LEFT HEART CATH AND CORONARY ANGIOGRAPHY N/A 09/20/2016   Procedure: Left Heart Cath  and Coronary Angiography;  Surgeon: Shawna Crome, MD;  Location: [place] CV LAB;  Service: Cardiovascular;  Laterality: N/A;  . LEFT HEART CATH AND CORS/GRAFTS ANGIOGRAPHY N/A 10/13/2016   Procedure: Left Heart Cath and Cors/Grafts Angiography;  Surgeon: Troy Sine, MD;  Location: [place] CV LAB;  Service: Cardiovascular;  Laterality: N/A;  . LEFT HEART CATHETERIZATION WITH CORONARY ANGIOGRAM N/A 12/15/2011   Procedure: LEFT HEART CATHETERIZATION WITH CORONARY ANGIOGRAM;  Surgeon: Burnell Blanks, MD;  Location: [facility] CATH LAB;  Service: Cardiovascular;  Laterality: N/A;  . LEFT HEART CATHETERIZATION WITH CORONARY ANGIOGRAM N/A 01/10/2012   Procedure: LEFT HEART CATHETERIZATION WITH CORONARY ANGIOGRAM;  Surgeon: Peter M Martinique, MD;  Location: [facility] CATH LAB;  Service: Cardiovascular;  Laterality: N/A;  . LEFT HEART CATHETERIZATION WITH CORONARY ANGIOGRAM N/A 06/08/2012   Procedure: LEFT HEART CATHETERIZATION WITH CORONARY ANGIOGRAM;  Surgeon: Annita Brod  Angelena Form, MD;  Location: [place] CATH LAB;  Service: Cardiovascular;  Laterality: N/A;  . LEFT HEART CATHETERIZATION WITH CORONARY/GRAFT ANGIOGRAM N/A 12/10/2013   Procedure: LEFT HEART CATHETERIZATION WITH Beatrix Fetters;  Surgeon: [name], MD;  Location: [facility] CATH LAB;  Service: Cardiovascular;  Laterality: N/A;  . OVARY SURGERY     ovarian cancer  . SHUNTOGRAM N/A 10/15/2013   Procedure: Fistulogram;  Surgeon: Serafina Mitchell, MD;  Location: [facility] CATH LAB;  Service: Cardiovascular;  Laterality: N/A;  . THROMBECTOMY / ARTERIOVENOUS GRAFT REVISION  2011   left upper arm  . TUBAL LIGATION  1980's     reports that she has never smoked. She has never used smokeless tobacco. She reports that she does not drink alcohol or use drugs.  She is married.  Allergies  Allergen Reactions  . Aspirin Other (See Comments)    High Doses Mess up her stomach; "makes my bowels have blood in them". Takes 81 mg EC Aspirin   . Penicillins  Other (See Comments)    SYNCOPE? , "makes me real weak when I take it; like I'll pass out"  Has patient had a PCN reaction causing immediate rash, facial/tongue/throat swelling, SOB or lightheadedness with hypotension: no Has patient had a PCN reaction causing severe rash involving mucus membranes or skin necrosis: no Has patient had a PCN reaction that required hospitalization no Has patient had a PCN reaction occurring within the last 10 years: no If all of the above  . Bactrim [Sulfamethoxazole-Trimethoprim] Rash  . Contrast Media [Iodinated Diagnostic Agents] Itching  . Iron Itching and Other (See Comments)    "they gave me iron in dialysis; had to give me Benadryl cause I had to have the iron" (05/02/2012)  . Nitrofurantoin Hives  . Prilosec [Omeprazole] Other (See Comments)    "back spasms"  . Tylenol [Acetaminophen] Itching and Other (See Comments)    Makes her feet on fire per pt  . Dexilant [Dexlansoprazole] Other (See Comments)    Upset stomach  . Levaquin [Levofloxacin In D5w] Rash  . Morphine And Related Itching    Itching in feet  . Plavix [Clopidogrel Bisulfate] Rash  . Protonix [Pantoprazole Sodium] Rash  . Venofer [Ferric Oxide] Itching    Patient reports using Benadryl prior to doses as [place]    Family History  Problem Relation Age of Onset  . Heart disease Mother     Heart Disease before age 97  . Hyperlipidemia Mother   . Hypertension Mother   . Diabetes Mother   . Heart attack Mother   . Heart disease Father     Heart Disease before age 62  . Hyperlipidemia Father   . Hypertension Father   . Diabetes Father   . Diabetes Sister   . Hypertension Sister   . Diabetes Brother   . Hyperlipidemia Brother   . Heart attack Brother   . Hypertension Sister   . Heart attack Brother   . Other      noncontributory for early CAD  . Colon cancer Neg Hx   . Esophageal cancer Neg Hx   . Liver disease Neg Hx   . Kidney disease Neg Hx   . Colon polyps  Neg Hx      Prior to Admission medications   Medication Sig Start Date End Date Taking? Authorizing Provider  aspirin EC 81 MG tablet Take 81 mg by mouth daily.    Yes Historical Provider, MD  ticagrelor (BRILINTA) 90 MG TABS  tablet Take 1 tablet (90 mg total) by mouth 2 (two) times daily. 10/14/16  Yes Bhavinkumar Bhagat, PA  ALPRAZolam (XANAX) 0.25 MG tablet Take 0.25 mg by mouth 2 (two) times daily as needed for anxiety.  11/19/15   Historical Provider, MD  amiodarone (PACERONE) 200 MG tablet Take 1 tablet (200 mg total) by mouth daily. 08/19/16   Herminio Commons, MD  amLODipine (NORVASC) 2.5 MG tablet Take 2.5 mg by mouth daily.    Historical Provider, MD  atorvastatin (LIPITOR) 40 MG tablet Take 1 tablet (40 mg total) by mouth daily at 6 PM. Patient taking differently: Take 40 mg by mouth daily.  09/22/16   Tiffany Carlota Raspberry, PA-C  carvedilol (COREG) 6.25 MG tablet Take 1 tablet (6.25 mg total) by mouth 2 (two) times daily with a meal. 10/14/16   Mariel Aloe, MD  cloNIDine (CATAPRES) 0.2 MG tablet Take 1 tablet (0.2 mg total) by mouth 2 (two) times daily. 09/22/16   Delos Haring, PA-C  epoetin alfa (EPOGEN,PROCRIT) 42683 UNIT/ML injection Inject 1 mL (10,000 Units total) into the vein every Monday, Wednesday, and Friday with hemodialysis. 04/11/16   Orvan Falconer, MD  fluticasone (FLONASE) 50 MCG/ACT nasal spray Place 2 sprays into the nose daily as needed for allergies.     Historical Provider, MD  hydrALAZINE (APRESOLINE) 50 MG tablet Take 1.5 tablets (75 mg total) by mouth 3 (three) times daily. 09/28/16   Herminio Commons, MD  isosorbide mononitrate (IMDUR) 120 MG 24 hr tablet Take 1 tablet (120 mg total) by mouth daily. 09/22/16   Tiffany Carlota Raspberry, PA-C  lanthanum (FOSRENOL) 1000 MG chewable tablet Chew 1 tablet (1,000 mg total) by mouth 3 (three) times daily with meals. Patient taking differently: Chew 1,000 mg by mouth 3 (three) times daily after meals.  04/08/16   Orvan Falconer, MD  loratadine  (CLARITIN) 10 MG tablet Take 10 mg by mouth daily as needed for allergies.    Historical Provider, MD  multivitamin (RENA-VIT) TABS tablet Take 1 tablet by mouth daily.    Historical Provider, MD  nitroGLYCERIN (NITROSTAT) 0.4 MG SL tablet Place 1 tablet (0.4 mg total) under the tongue every 5 (five) minutes as needed for chest pain. 08/02/16   Kinnie Feil, MD  oxyCODONE-acetaminophen (PERCOCET/ROXICET) 5-325 MG tablet Take 1 tablet by mouth every 6 (six) hours as needed for pain. 09/06/16   Historical Provider, MD  pantoprazole (PROTONIX) 40 MG tablet Take 1 tablet (40 mg total) by mouth daily. 08/03/16   Kinnie Feil, MD  SENSIPAR 30 MG tablet Take 30 mg by mouth daily with supper.  12/21/12   Historical Provider, MD    Physical Exam: Vitals:   10/24/16 1658 10/24/16 1700 10/24/16 1854 10/24/16 1930  BP:  (!) 143/46 (!) 146/51 (!) 151/54  Pulse:  66 64 64  Resp:  17 19 14   Temp:  98.6 F (37 C) 99 F (37.2 C)   TempSrc:  Oral Oral   SpO2:  100% 99% 100%  Weight: 57.2 kg (126 lb)     Height: 5\' 2"  (1.575 m)         Constitutional: NAD, calm, comfortable, NONtoxic appearing Vitals:   10/24/16 1658 10/24/16 1700 10/24/16 1854 10/24/16 1930  BP:  (!) 143/46 (!) 146/51 (!) 151/54  Pulse:  66 64 64  Resp:  17 19 14   Temp:  98.6 F (37 C) 99 F (37.2 C)   TempSrc:  Oral Oral   SpO2:  100% 99%  100%  Weight: 57.2 kg (126 lb)     Height: 5\' 2"  (1.575 m)      Eyes: PERRL, lids and conjunctivae normal ENMT: Mucous membranes are slightly dry.  Lips and gums are pale.  Posterior pharynx clear of any exudate or lesions. Normal dentition.  Neck: normal appearance, supple, no masses Respiratory: clear to auscultation bilaterally, no wheezing, no crackles. Normal respiratory effort. No accessory muscle use.  Cardiovascular: Normal rate, regular rhythm, no murmurs / rubs / gallops. No extremity edema. 2+ pedal pulses. GI: abdomen is soft and compressible.  No distention.  No  tenderness.  Bowel sounds are present. Musculoskeletal:  No joint deformity in lower extremities. LUE AV fistula (nonfunctional per patient).  Good ROM, no contractures. Normal muscle tone.  Skin: no rashes, warm and dry Neurologic: Sensation intact, Strength symmetric bilaterally.  Face symmetric.  EOMI.  Tongues is midline. Psychiatric: Normal judgment and insight. Alert and oriented x 3. Normal mood.     Labs on Admission: I have personally reviewed following labs and imaging studies  CBC:  Recent Labs Lab 10/24/16 1700  WBC 7.7  NEUTROABS 5.8  HGB 7.3*  HCT 22.6*  MCV 95.8  PLT 791   Basic Metabolic Panel:  Recent Labs Lab 10/24/16 1700  NA 132*  K 3.1*  CL 92*  CO2 29  GLUCOSE 121*  BUN 9  CREATININE 2.88*  CALCIUM 8.6*   GFR: Estimated Creatinine Clearance: 13.1 mL/min (A) (by C-G formula based on SCr of 2.88 mg/dL (H)). Liver Function Tests:  Recent Labs Lab 10/24/16 1700  AST 33  ALT 31  ALKPHOS 53  BILITOT 0.3  PROT 6.0*  ALBUMIN 3.0*    Assessment/Plan Principal Problem:   Symptomatic anemia Active Problems:   Atherosclerosis of native coronary artery of native heart without angina pectoris   ESRD on hemodialysis (HCC)   HLD (hyperlipidemia)   GERD (gastroesophageal reflux disease)   AVM (arteriovenous malformation) of small bowel, acquired ([place])   Hx of CABG   Chronic diastolic CHF (congestive heart failure) (HCC)   H/O non-ST elevation myocardial infarction (NSTEMI)      Symptomatic anemia, acute on chronic anemia --Transfuse 2 units of PRBCs.  Difficulty with getting peripheral IV in the ED; if IV therapy team cannot get peripheral IV, may need to have HD nurse access tunneled catheter (which I have discussed with Dr. Augustin Coupe, nephrology on call) --Check post-transfusion H/H to assess for appropriate clinical response --Monitor for signs of active bleeding, known history of AVMs --Continue oral PPI for now --Stool heme occult with next  BM --Must continue ASA and Brilinta due to recent placement of DES   CAD S/P CABG S/P recent NSTEMI S/P DES --Baby ASA and brilinta --Coreg, lipitor, Imdur  HTN --Norvasc, coreg, clonidine, hydralazine, imdur  ESRD on HD --Due to dialyze again on Wednesday --Continue fosrenol, sensipar, renal vitamin    DVT prophylaxis: Low risk, outpatient status Code Status: FULL Family Communication: Husband present at bedside in the ED at time of admission. Disposition Plan: Expect she will go home tomorrow after she received blood transfusion. Consults called: NONE Admission status: Place in observation with telemetry monitoring.   TIME SPENT: 50 minutes   Eber Jones MD [facility] Pager [phone]  If 7PM-7AM, please contact night-coverage [email or web address] Password [facility]  10/24/2016, 9:54 PM

## 2016-10-24 NOTE — ED Triage Notes (Signed)
Pt. Stated, I just came from dialysis and they said I need to come here for a blood transfusion.  My labs was too low. Pt's hgb is 6.3

## 2016-10-24 NOTE — ED Notes (Signed)
EDMD at bedside

## 2016-10-24 NOTE — ED Provider Notes (Signed)
[facility] DEPT Provider Note   CSN: [id number] Arrival date & time: 10/24/16  1607     History   Chief Complaint Chief Complaint  Patient presents with  . Anemia    HPI Shawna [name] is a 76 y.o. female.  The history is provided by the patient.  Illness  This is a new problem. Episode onset: Pt told her hemoglobin 6.3 at dialysis clinic today. The problem occurs constantly. The problem has not changed since onset.Pertinent negatives include no chest pain, no abdominal pain, no headaches and no shortness of breath. Nothing aggravates the symptoms. Nothing relieves the symptoms. She has tried nothing for the symptoms. The treatment provided no relief.    Past Medical History:  Diagnosis Date  . Acute on chronic respiratory failure with hypoxia ([place]) 10/10/2016  . Anxiety   . Arthritis   . AVM (arteriovenous malformation) of colon   . CAD (coronary artery disease)    a. 12/2011 NSTEMI/Cath/PCI LCX (2.25x14 Resolute DES) & D1 (2.25x22 Resolute DES);  b. 01/2012 Cath/PCI: LM 30, LAD 30p, 40-64m, D1 stent ok, 99 in sm branch of diag, LCX patent stent, OM1 20, RCA 95 ost (4.0x12 Promus DES), EF 55%;  c. 04/2012 Lexi Cardiolite  EF 48%, small area of scar @ base/mid inflat wall with mild peri-infarct ischemia.; CABG 12/4  . Carotid artery disease ([place])    a. 78-29% LICA, 11/6211   . Chronic bronchitis ([place])   . Chronic diastolic CHF (congestive heart failure) ([place])    a. 02/2012 Echo EF 60-65%, nl wall motion, Gr 1 DD, mod MR  . Colon cancer ([place]) 1992  . Esophageal stricture   . ESRD on hemodialysis ([place])    ESRD due to HTN, started dialysis 2011 and gets HD at [facility] with Dr Hinda Lenis on MWF schedule.  Access is LUA AVF as of Sept 2014.   Marland Kitchen GERD (gastroesophageal reflux disease)   . High cholesterol 12/2011  . History of blood transfusion 07/2011; 12/2011; 01/2012 X 2; 04/2012  . History of gout   . History of lower GI bleeding   . Hypertension   . Iron deficiency anemia   .  Mitral regurgitation    a. Moderate by echo, 02/2012  . Myocardial infarction ([place])   . Ovarian cancer ([place]) 1992  . Pneumonia ~ 2009  . PUD (peptic ulcer disease)   . TIA (transient ischemic attack)     Patient Active Problem List   Diagnosis Date Noted  . Anemia 10/24/2016  . Fluid overload 10/10/2016  . Complication from renal dialysis device 10/10/2016  . Non-ST elevation MI (NSTEMI) ([place])   . Encounter for fitting and adjustment of vascular catheter   . End stage renal disease ([place])   . ESRD (end stage renal disease) ([place])   . Heme positive stool   . Demand ischemia ([place]) 07/27/2016  . Hypertensive emergency 07/08/2016  . Acute on chronic respiratory failure with hypoxia ([place])   . Cardiac arrest ([place])   . Palliative care encounter   . Goals of care, counseling/discussion   . Hypertensive crisis without congestive heart failure 05/09/2016  . Acute pulmonary edema ([place]) 04/06/2016  . Acute respiratory failure ([place]) 04/06/2016  . Hypertensive crisis 01/27/2016  . History of colon cancer 01/27/2016  . History of ovarian cancer 01/27/2016  . Hypertensive urgency 01/27/2016  . Narrow complex tachycardia ([place]) 09/08/2015  . SVT (supraventricular tachycardia) ([place]) 09/08/2015  . Influenza A 08/30/2015  . Acute on chronic diastolic CHF (congestive  heart failure) ([place]) 05/04/2015  . Unstable angina ([place]) 05/03/2015  . Essential hypertension   . Pain in joint, lower leg 08/14/2014  . Chest pain 11/26/2013  . Small bowel obstruction, partial ([place]) 05/29/2013  . Chronic diastolic CHF (congestive heart failure) ([place]) 03/22/2013  . GI bleeding 03/21/2013  . Acute blood loss anemia 03/21/2013  . Vaginal odor 03/12/2013  . Vaginal discharge 03/12/2013  . Occlusion and stenosis of carotid artery without mention of cerebral infarction 01/24/2013  . Hx of CABG 07/05/2012  . Carotid artery disease ([place]) 07/05/2012  . Anemia of chronic kidney failure 07/03/2012  . Secondary  hyperparathyroidism ([place]) 07/03/2012  . Mitral regurgitation 06/12/2012  . Pneumonia 06/09/2012  . Non-STEMI (non-ST elevated myocardial infarction) ([place]) 06/08/2012  . AVM (arteriovenous malformation) of small bowel, acquired ([place]) 01/20/2012  . GERD (gastroesophageal reflux disease) 01/09/2012  . HLD (hyperlipidemia) 01/05/2012  . Atherosclerosis of native coronary artery of native heart without angina pectoris 12/16/2011  . ESRD on hemodialysis ([place]) 12/16/2011    Past Surgical History:  Procedure Laterality Date  . ABDOMINAL HYSTERECTOMY  1992  . APPENDECTOMY  06/1990  . AV FISTULA PLACEMENT  07/2009   left upper arm  . AV FISTULA PLACEMENT Right 09/06/2016   Procedure: RIGHT FOREARM ARTERIOVENOUS (AV) GRAFT;  Surgeon: Elam Dutch, MD;  Location: [facility];  Service: Vascular;  Laterality: Right;  . [place] REMOVAL Right 09/06/2016   Procedure: REMOVAL OF Right Arm ARTERIOVENOUS GORETEX GRAFT and Vein Patch angioplasty of brachial artery;  Surgeon: Angelia Mould, MD;  Location: [facility];  Service: Vascular;  Laterality: Right;  . COLON RESECTION  1992  . CORONARY ANGIOPLASTY WITH STENT PLACEMENT  12/15/11   "2"  . CORONARY ANGIOPLASTY WITH STENT PLACEMENT  y/2013   "1; makes total of 3" (05/02/2012)  . CORONARY ARTERY BYPASS GRAFT  06/13/2012   Procedure: CORONARY ARTERY BYPASS GRAFTING (CABG);  Surgeon: Grace Isaac, MD;  Location: [facility];  Service: Open Heart Surgery;  Laterality: N/A;  cabg x four;  using left internal mammary artery, and left leg greater saphenous vein harvested endoscopically  . CORONARY STENT INTERVENTION N/A 10/13/2016   Procedure: Coronary Stent Intervention;  Surgeon: Troy Sine, MD;  Location: [facility] CV LAB;  Service: Cardiovascular;  Laterality: N/A;  . DILATION AND CURETTAGE OF UTERUS    . ESOPHAGOGASTRODUODENOSCOPY  01/20/2012   Procedure: ESOPHAGOGASTRODUODENOSCOPY (EGD);  Surgeon: Ladene Artist, MD,FACG;  Location: [facility] ENDOSCOPY;  Service:  Endoscopy;  Laterality: N/A;  . ESOPHAGOGASTRODUODENOSCOPY N/A 03/26/2013   Procedure: ESOPHAGOGASTRODUODENOSCOPY (EGD);  Surgeon: Irene Shipper, MD;  Location: [facility] ENDOSCOPY;  Service: Endoscopy;  Laterality: N/A;  . ESOPHAGOGASTRODUODENOSCOPY N/A 04/30/2015   Procedure: ESOPHAGOGASTRODUODENOSCOPY (EGD);  Surgeon: Rogene Houston, MD;  [facility];  Service: Endoscopy;  Laterality: N/A;  1pm - moved to 10/20 @ 1:10  . ESOPHAGOGASTRODUODENOSCOPY N/A 07/29/2016   Procedure: ESOPHAGOGASTRODUODENOSCOPY (EGD);  Surgeon: Manus Gunning, MD;  Location: [facility];  Service: Gastroenterology;  Laterality: N/A;  enteroscopy  . INTRAOPERATIVE TRANSESOPHAGEAL ECHOCARDIOGRAM  06/13/2012   Procedure: INTRAOPERATIVE TRANSESOPHAGEAL ECHOCARDIOGRAM;  Surgeon: Grace Isaac, MD;  Location: [facility];  Service: Open Heart Surgery;  Laterality: N/A;  . IR GENERIC HISTORICAL  07/26/2016   IR FLUORO GUIDE CV LINE RIGHT 07/26/2016 Greggory Keen, MD [facility]INTER[facility]  . IR GENERIC HISTORICAL  07/26/2016   IR US GUIDE VASC ACCESS RIGHT 07/26/2016 Greggory Keen, MD [facility]  . IR GENERIC HISTORICAL  08/02/2016  IR US GUIDE VASC ACCESS RIGHT 08/02/2016 Greggory Keen, MD [facility]  . IR GENERIC HISTORICAL  08/02/2016   IR FLUORO GUIDE CV LINE RIGHT 08/02/2016 Greggory Keen, MD [facility]  . LEFT HEART CATH AND CORONARY ANGIOGRAPHY N/A 09/20/2016   Procedure: Left Heart Cath and Coronary Angiography;  Surgeon: [name], MD;  Location: [name] CV LAB;  Service: Cardiovascular;  Laterality: N/A;  . LEFT HEART CATH AND CORS/GRAFTS ANGIOGRAPHY N/A 10/13/2016   Procedure: Left Heart Cath and Cors/Grafts Angiography;  Surgeon: Troy Sine, MD;  Location: [facility] CV LAB;  Service: Cardiovascular;  Laterality: N/A;  . LEFT HEART CATHETERIZATION WITH CORONARY ANGIOGRAM N/A 12/15/2011   Procedure: LEFT HEART CATHETERIZATION WITH CORONARY ANGIOGRAM;  Surgeon: Burnell Blanks, MD;  Location: [facility]  CATH LAB;  Service: Cardiovascular;  Laterality: N/A;  . LEFT HEART CATHETERIZATION WITH CORONARY ANGIOGRAM N/A 01/10/2012   Procedure: LEFT HEART CATHETERIZATION WITH CORONARY ANGIOGRAM;  Surgeon: Peter M Martinique, MD;  Location: [facility] CATH LAB;  Service: Cardiovascular;  Laterality: N/A;  . LEFT HEART CATHETERIZATION WITH CORONARY ANGIOGRAM N/A 06/08/2012   Procedure: LEFT HEART CATHETERIZATION WITH CORONARY ANGIOGRAM;  Surgeon: Burnell Blanks, MD;  Location: [facility] CATH LAB;  Service: Cardiovascular;  Laterality: N/A;  . LEFT HEART CATHETERIZATION WITH CORONARY/GRAFT ANGIOGRAM N/A 12/10/2013   Procedure: LEFT HEART CATHETERIZATION WITH Beatrix Fetters;  Surgeon: [name], MD;  Location: [facility] CATH LAB;  Service: Cardiovascular;  Laterality: N/A;  . OVARY SURGERY     ovarian cancer  . SHUNTOGRAM N/A 10/15/2013   Procedure: Fistulogram;  Surgeon: Serafina Mitchell, MD;  Location: [facility] CATH LAB;  Service: Cardiovascular;  Laterality: N/A;  . THROMBECTOMY / ARTERIOVENOUS GRAFT REVISION  2011   left upper arm  . TUBAL LIGATION  1980's    OB History    Gravida Para Term Preterm AB Living   2 2   2   2    SAB TAB Ectopic Multiple Live Births                   Home Medications    Prior to Admission medications   Medication Sig Start Date End Date Taking? Authorizing Provider  aspirin EC 81 MG tablet Take 81 mg by mouth daily.    Yes Historical Provider, MD  ticagrelor (BRILINTA) 90 MG TABS tablet Take 1 tablet (90 mg total) by mouth 2 (two) times daily. 10/14/16  Yes Bhavinkumar Bhagat, PA  ALPRAZolam (XANAX) 0.25 MG tablet Take 0.25 mg by mouth 2 (two) times daily as needed for anxiety.  11/19/15   Historical Provider, MD  amiodarone (PACERONE) 200 MG tablet Take 1 tablet (200 mg total) by mouth daily. 08/19/16   Herminio Commons, MD  amLODipine (NORVASC) 2.5 MG tablet Take 2.5 mg by mouth daily.    Historical Provider, MD  atorvastatin (LIPITOR) 40 MG tablet Take 1 tablet (40 mg  total) by mouth daily at 6 PM. Patient taking differently: Take 40 mg by mouth daily.  09/22/16   Tiffany Carlota Raspberry, PA-C  carvedilol (COREG) 6.25 MG tablet Take 1 tablet (6.25 mg total) by mouth 2 (two) times daily with a meal. 10/14/16   Mariel Aloe, MD  cloNIDine (CATAPRES) 0.2 MG tablet Take 1 tablet (0.2 mg total) by mouth 2 (two) times daily. 09/22/16   Delos Haring, PA-C  epoetin alfa (EPOGEN,PROCRIT) 38756 UNIT/ML injection Inject 1 mL (10,000 Units total) into the vein every Monday, Wednesday, and Friday with hemodialysis. 04/11/16  Orvan Falconer, MD  fluticasone [facility]) 50 MCG/ACT nasal spray Place 2 sprays into the nose daily as needed for allergies.     Historical Provider, MD  hydrALAZINE (APRESOLINE) 50 MG tablet Take 1.5 tablets (75 mg total) by mouth 3 (three) times daily. 09/28/16   Herminio Commons, MD  isosorbide mononitrate (IMDUR) 120 MG 24 hr tablet Take 1 tablet (120 mg total) by mouth daily. 09/22/16   Tiffany Carlota Raspberry, PA-C  lanthanum (FOSRENOL) 1000 MG chewable tablet Chew 1 tablet (1,000 mg total) by mouth 3 (three) times daily with meals. Patient taking differently: Chew 1,000 mg by mouth 3 (three) times daily after meals.  04/08/16   Orvan Falconer, MD  loratadine (CLARITIN) 10 MG tablet Take 10 mg by mouth daily as needed for allergies.    Historical Provider, MD  multivitamin (RENA-VIT) TABS tablet Take 1 tablet by mouth daily.    Historical Provider, MD  nitroGLYCERIN (NITROSTAT) 0.4 MG SL tablet Place 1 tablet (0.4 mg total) under the tongue every 5 (five) minutes as needed for chest pain. 08/02/16   Kinnie Feil, MD  oxyCODONE-acetaminophen (PERCOCET/ROXICET) 5-325 MG tablet Take 1 tablet by mouth every 6 (six) hours as needed for pain. 09/06/16   Historical Provider, MD  pantoprazole (PROTONIX) 40 MG tablet Take 1 tablet (40 mg total) by mouth daily. 08/03/16   Kinnie Feil, MD  SENSIPAR 30 MG tablet Take 30 mg by mouth daily with supper.  12/21/12   Historical Provider, MD      Family History Family History  Problem Relation Age of Onset  . Heart disease Mother     Heart Disease before age 96  . Hyperlipidemia Mother   . Hypertension Mother   . Diabetes Mother   . Heart attack Mother   . Heart disease Father     Heart Disease before age 79  . Hyperlipidemia Father   . Hypertension Father   . Diabetes Father   . Diabetes Sister   . Hypertension Sister   . Diabetes Brother   . Hyperlipidemia Brother   . Heart attack Brother   . Hypertension Sister   . Heart attack Brother   . Other      noncontributory for early CAD  . Colon cancer Neg Hx   . Esophageal cancer Neg Hx   . Liver disease Neg Hx   . Kidney disease Neg Hx   . Colon polyps Neg Hx     Social History Social History  Substance Use Topics  . Smoking status: Never Smoker  . Smokeless tobacco: Never Used  . Alcohol use No     Allergies   Aspirin; Penicillins; Bactrim [sulfamethoxazole-trimethoprim]; Contrast media [iodinated diagnostic agents]; Iron; Nitrofurantoin; Prilosec [omeprazole]; Tylenol [acetaminophen]; Dexilant [dexlansoprazole]; Levaquin [levofloxacin in d5w]; Morphine and related; Plavix [clopidogrel bisulfate]; Protonix [pantoprazole sodium]; and Venofer [ferric oxide]   Review of Systems Review of Systems  Constitutional: Negative for chills and fever.  HENT: Negative for ear pain and sore throat.   Eyes: Negative for pain and visual disturbance.  Respiratory: Negative for cough and shortness of breath.   Cardiovascular: Negative for chest pain and palpitations.  Gastrointestinal: Negative for abdominal pain, blood in stool, diarrhea, nausea, rectal pain and vomiting.  Genitourinary: Negative for vaginal bleeding.  Musculoskeletal: Negative for arthralgias and back pain.  Skin: Negative for color change and rash.  Neurological: Negative for seizures, syncope and headaches.  Psychiatric/Behavioral: Negative for confusion.  All other systems reviewed and are  negative.  Physical Exam Updated Vital Signs BP (!) 151/54   Pulse 64   Temp 99 F (37.2 C) (Oral)   Resp 14   Ht 5\' 2"  (1.575 m)   Wt 57.2 kg   SpO2 100%   BMI 23.05 kg/m   Physical Exam  Constitutional: She appears well-developed and well-nourished. No distress.  HENT:  Head: Normocephalic and atraumatic.  Eyes: Conjunctivae and EOM are normal.  Neck: Neck supple.  Cardiovascular: Normal rate and regular rhythm.   No murmur heard. Pulmonary/Chest: Effort normal and breath sounds normal. No respiratory distress.  Abdominal: Soft. There is no tenderness.  Genitourinary: Rectal exam shows no external hemorrhoid, no internal hemorrhoid, no tenderness and guaiac negative stool.  Musculoskeletal: She exhibits no edema.  Neurological: She is alert.  Skin: Skin is warm and dry.  Psychiatric: She has a normal mood and affect.  Nursing note and vitals reviewed.    ED Treatments / Results  Labs (all labs ordered are listed, but only abnormal results are displayed) Labs Reviewed  CBC WITH DIFFERENTIAL/PLATELET - Abnormal; Notable for the following:       Result Value   RBC 2.36 (*)    Hemoglobin 7.3 (*)    HCT 22.6 (*)    RDW 16.8 (*)    All other components within normal limits  COMPREHENSIVE METABOLIC PANEL - Abnormal; Notable for the following:    Sodium 132 (*)    Potassium 3.1 (*)    Chloride 92 (*)    Glucose, Bld 121 (*)    Creatinine, Ser 2.88 (*)    Calcium 8.6 (*)    Total Protein 6.0 (*)    Albumin 3.0 (*)    GFR calc non Af Amer 15 (*)    GFR calc Af Amer 17 (*)    All other components within normal limits  TYPE AND SCREEN    EKG  EKG Interpretation None       Radiology No results found.  Procedures Procedures (including critical care time)  Medications Ordered in ED Medications - No data to display   Initial Impression / Assessment and Plan / ED Course  I have reviewed the triage vital signs and the nursing notes.  Pertinent labs  & imaging results that were available during my care of the patient were reviewed by me and considered in my medical decision making (see chart for details).     76 year old black female with history of ESRD on HD Monday Wednesday Friday, CAD status post CABG with recent in STEMI approximately 10 days ago, GI bleed from AVM and presents in setting of anemia. Patient reports she was feeling well today and went to dialysis clinic where she was noted to have a hemoglobin of 6.3. Patient denies chest pain, shortness of breath, headache, abdominal pain, nausea, vomiting, possibly some diarrhea. Patient reports should 1 darkish stool yesterday but no other abnormality in stool. Patient denies alcohol or NSAID use.  On arrival patient was hemodynamically stable and afebrile. Repeat hemoglobin noted to be 7.3. Rectal exam revealed no bright red blood per rectum or melena. Patient otherwise resting comfortably. On evaluation of patient's records it appears she has an approximately 2. drop in hemoglobin since discharged 10 days ago. Patient was started on Brilinta during this admission in setting of new stent. With concern for possible bleed patient will require admission to the medicine team for repeat hemoglobin as well as continued monitoring. Patient may require transfusion due to significant cardiac history. Patient remained stable  in the emergency department on reassessment continued to have no pain. Patient stable at time of transfer of care.  Final Clinical Impressions(s) / ED Diagnoses   Final diagnoses:  Anemia, unspecified type    New Prescriptions New Prescriptions   No medications on file     Esaw Grandchild, MD 10/24/16 2123    Sharlett Iles, MD 11/01/16 2014

## 2016-10-24 NOTE — Progress Notes (Signed)
New Admission Note:  Arrival Method: Stretcher from ED Mental Orientation: A&O x4 Telemetry: Box 23, CCMD notified Assessment: Completed Skin: Assessed with Charito, RN, check flowsheets IV: R FA, saline locked Pain: 0/10 Tubes: N/A Safety Measures: Safety Fall Prevention Plan was given, discussed and signed by patient Admission: Completed [address] [place] Orientation: Patient has been orientated to the room, unit and the staff. Family: husband at the bedside Abuse/Neglect assessment not completed due to patients husband at bedside. Orders have been reviewed and implemented. Will continue to monitor the patient. Call light has been placed within reach and bed alarm has been activated.   Nena Polio BSN, RN  Phone Number: [phone]

## 2016-10-25 DIAGNOSIS — Z992 Dependence on renal dialysis: Secondary | ICD-10-CM | POA: Diagnosis not present

## 2016-10-25 DIAGNOSIS — K219 Gastro-esophageal reflux disease without esophagitis: Secondary | ICD-10-CM | POA: Diagnosis not present

## 2016-10-25 DIAGNOSIS — E7800 Pure hypercholesterolemia, unspecified: Secondary | ICD-10-CM

## 2016-10-25 DIAGNOSIS — I2510 Atherosclerotic heart disease of native coronary artery without angina pectoris: Secondary | ICD-10-CM

## 2016-10-25 DIAGNOSIS — Z951 Presence of aortocoronary bypass graft: Secondary | ICD-10-CM | POA: Diagnosis not present

## 2016-10-25 DIAGNOSIS — K558 Other vascular disorders of intestine: Secondary | ICD-10-CM

## 2016-10-25 DIAGNOSIS — I5032 Chronic diastolic (congestive) heart failure: Secondary | ICD-10-CM | POA: Diagnosis not present

## 2016-10-25 DIAGNOSIS — D649 Anemia, unspecified: Secondary | ICD-10-CM | POA: Diagnosis not present

## 2016-10-25 DIAGNOSIS — N186 End stage renal disease: Secondary | ICD-10-CM | POA: Diagnosis not present

## 2016-10-25 LAB — HEMOGLOBIN AND HEMATOCRIT, BLOOD
HEMATOCRIT: 30.4 % — AB (ref 36.0–46.0)
HEMOGLOBIN: 9.9 g/dL — AB (ref 12.0–15.0)

## 2016-10-25 LAB — BASIC METABOLIC PANEL
Anion gap: 12 (ref 5–15)
BUN: 22 mg/dL — AB (ref 6–20)
CALCIUM: 9.3 mg/dL (ref 8.9–10.3)
CHLORIDE: 94 mmol/L — AB (ref 101–111)
CO2: 28 mmol/L (ref 22–32)
CREATININE: 4.7 mg/dL — AB (ref 0.44–1.00)
GFR calc Af Amer: 10 mL/min — ABNORMAL LOW (ref >60)
GFR, EST NON AFRICAN AMERICAN: 8 mL/min — AB (ref >60)
Glucose, Bld: 89 mg/dL (ref 65–99)
Potassium: 4.4 mmol/L (ref 3.5–5.1)
SODIUM: 134 mmol/L — AB (ref 135–145)

## 2016-10-25 LAB — MRSA PCR SCREENING: MRSA BY PCR: NEGATIVE

## 2016-10-25 NOTE — Discharge Instructions (Signed)
Blood Transfusion, Adult, Care After This sheet gives you information about how to care for yourself after your procedure. Your health care provider may also give you more specific instructions. If you have problems or questions, contact your health care provider. What can I expect after the procedure? After your procedure, it is common to have:  Bruising and soreness where the IV tube was inserted.  Headache. Follow these instructions at home:  Take over-the-counter and prescription medicines only as told by your health care provider.  Return to your normal activities as told by your health care provider.  Follow instructions from your health care provider about how to take care of your IV insertion site. Make sure you:  Wash your hands with soap and water before you change your bandage (dressing). If soap and water are not available, use hand sanitizer.  Change your dressing as told by your health care provider.  Check your IV insertion site every day for signs of infection. Check for:  More redness, swelling, or pain.  More fluid or blood.  Warmth.  Pus or a bad smell. Contact a health care provider if:  You have more redness, swelling, or pain around the IV insertion site.  You have more fluid or blood coming from the IV insertion site.  Your IV insertion site feels warm to the touch.  You have pus or a bad smell coming from the IV insertion site.  Your urine turns pink, red, or brown.  You feel weak after doing your normal activities. Get help right away if:  You have signs of a serious allergic or immune system reaction, including:  Itchiness.  Hives.  Trouble breathing.  Anxiety.  Chest or lower back pain.  Fever, flushing, and chills.  Rapid pulse.  Rash.  Diarrhea.  Vomiting.  Dark urine.  Serious headache.  Dizziness.  Stiff neck.  Yellow coloration of the face or the white parts of the eyes (jaundice). This information is not  intended to replace advice given to you by your health care provider. Make sure you discuss any questions you have with your health care provider. Document Released: 07/18/2014 Document Revised: 02/24/2016 Document Reviewed: 01/11/2016 Elsevier Interactive Patient Education  2017 [employer].   Blood Transfusion A blood transfusion is a procedure in which you are given blood through an IV tube. You may need this procedure because of:  Illness.  Surgery.  Injury. The blood may come from someone else (a donor). You may also be able to donate blood for yourself (autologous blood donation). The blood given in a transfusion is made up of different types of cells. You may get:  Red blood cells. These carry oxygen to the cells in the body.  White blood cells. These help you fight infections.  Platelets. These help your blood to clot.  Plasma. This is the liquid part of your blood. It helps with fluid imbalances. If you have a clotting disorder, you may also get other types of blood products. What happens before the procedure?  You will have a blood test to find out your blood type. The test also finds out what type of blood your body will accept and matches it to the donor type.  If you are going to have a planned surgery, you may be able to donate your own blood. This may be done in case you need a transfusion.  If you have had an allergic reaction to a transfusion in the past, you may be given medicine to  help prevent a reaction. This medicine may be given to you by mouth or through an IV.  You will have your temperature, blood pressure, and pulse checked.  Follow instructions from your doctor about what you cannot eat or drink.  Ask your doctor about:  Changing or stopping your regular medicines. This is important if you take diabetes medicines or blood thinners.  Taking medicines such as aspirin and ibuprofen. These medicines can thin your blood. Do not take these medicines  before your procedure if your doctor tells you not to. What happens during the procedure?  An IV tube will be put into one of your veins.  The bag of donated blood will be attached to your IV tube. Then, the blood will enter through your vein.  Your temperature, blood pressure, and pulse will be checked regularly during the procedure. This is done to find early signs of a transfusion reaction.  If you have any signs or symptoms of a reaction, your transfusion will be stopped. You may also be given medicine.  When the transfusion is done, your IV tube will be taken out.  Pressure may be applied to the IV site for a few minutes.  A bandage (dressing) will be put on the IV site. The procedure may vary among doctors and hospitals. What happens after the procedure?  Your temperature, blood pressure, heart rate, breathing rate, and blood oxygen level will be checked often.  Your blood may be tested to see how you are responding to the transfusion.  You may be warmed with fluids or blankets. This is done to keep the temperature of your body normal. Summary  A blood transfusion is a procedure in which you are given blood through an IV tube.  The blood may come from someone else (a donor). You may also be able to donate blood for yourself.  If you have had an allergic reaction to a transfusion in the past, you may be given medicine to help prevent a reaction. This medicine may be given to you by mouth or through an IV tube.  Your temperature, blood pressure, heart rate, breathing rate, and blood oxygen level will be checked often.  Your blood may be tested to see how you are responding to the transfusion. This information is not intended to replace advice given to you by your health care provider. Make sure you discuss any questions you have with your health care provider. Document Released: 09/23/2008 Document Revised: 02/19/2016 Document Reviewed: 02/19/2016 Elsevier Interactive Patient  Education  2017 [employer].

## 2016-10-25 NOTE — Care Management Note (Signed)
Case Management Note  Patient Details  Name: Shawna [name] MRN: [id number] Date of Birth: Dec 06, 1940  Subjective/Objective:                 Patient from home with husband, in obs <24 hours for anemia and transfusion. No CM needs identified at this time.    Action/Plan:  DC to home.  Expected Discharge Date:  10/25/16               Expected Discharge Plan:  Home/Self Care  In-House Referral:     Discharge planning Services  CM Consult  Post Acute Care Choice:    Choice offered to:     DME Arranged:    DME Agency:     HH Arranged:    HH Agency:     Status of Service:  Completed, signed off  If discussed at [employer] of Stay Meetings, dates discussed:    Additional Comments:  Carles Collet, RN 10/25/2016, 1:23 PM

## 2016-10-25 NOTE — Discharge Summary (Signed)
Physician Discharge Summary  [name] HQP:[id number] DOB: 1941-06-04 DOA: 10/24/2016  PCP: [name], MD  Admit date: 10/24/2016 Discharge date: 10/25/2016  Admitted From: Home  Disposition:  Home   Recommendations for Outpatient Follow-up:  1. Follow up with PCP in 1 weeks 2. Follow up with GI Dr. Laural Golden in 2 weeks 3. Please obtain BMP/CBC in one week 4. Please resume regular HD schedule  Discharge Condition: STABLE  CODE STATUS: FULL   Brief/Interim Summary: HPI: Shawna [name] is a 76 y.o. woman with a history of ESRD on HD MWF, CAD S/P CABG S/P multiple prior stents, recent NSTEMI with new DES to a 1st diagonal lesion (admitted here 4/1-4/6, on baby aspirin and Brilinta), history of GI bleeding secondary to AVMs, paroxysmal atrial fibrillation (CHADS-Vasc score of 6, presumably not anticoagulated due to history of GI bleeding and anemia), chronic diastolic heart failure, HTN, and ovarian and colon cancers who was referred to the ED from her HD unit for management of anemia.  Reportedly, her Hgb was 6.3 based on the labs at the HD unit.  In the ED, she has a hgb of 7.3  She has had increased weakness and fatigue since her discharge 10 days ago.  She has had intermittent palpitations.  No chest pain.  No SOB.  No light-headedness.  She has been taking her medications a prescribed.  No hematemesis or hemoptysis.  No coffee ground emesis.  No BRBPR.  No melena.  ED Course: Hgb 7.3; it was 9 almost two weeks ago.  Sodium 132.  Potassium 3.1.  Creatinine 2.88.  Type and screen sent in the ED.  Due to her cardiac history, transfusion is warranted for symptomatic anemia.  Hospitalist asked to place in observation for blood transfusion.  Symptomatic anemia, acute on chronic anemia --Transfused 2 units of PRBCs.  Hg improved to 9.9.  Pt feels much better, will discharge home with outpatient follow up. --Monitored for signs of active bleeding, known history of AVMs but none in  hospital.  --Continue oral PPI for now --Must continue ASA and Brilinta due to recent placement of DES  CAD S/P CABG S/P recent NSTEMI S/P DES --Baby ASA and brilinta --Coreg, lipitor, Imdur  Malignant HTN --Norvasc, coreg, clonidine, hydralazine, imdur  ESRD on HD --Due to dialyze again on Wednesday --Continue fosrenol, sensipar, renal vitamin  Code Status: FULL Family Communication: Husband present at bedside Disposition Plan: Home  Consults called: NONE Admission status: Place in observation with telemetry monitoring.  Discharge Diagnoses:  Principal Problem:   Symptomatic anemia Active Problems:   Atherosclerosis of native coronary artery of native heart without angina pectoris   ESRD on hemodialysis (HCC)   HLD (hyperlipidemia)   GERD (gastroesophageal reflux disease)   AVM (arteriovenous malformation) of small bowel, acquired (HCC)   Hx of CABG   Chronic diastolic CHF (congestive heart failure) (HCC)   H/O non-ST elevation myocardial infarction (NSTEMI)  Discharge Instructions  Discharge Instructions    Increase activity slowly    Complete by:  As directed      Allergies as of 10/25/2016      Reactions   Aspirin Other (See Comments)   High Doses Mess up her stomach; "makes my bowels have blood in them". Takes 81 mg EC Aspirin    Penicillins Other (See Comments)   SYNCOPE? , "makes me real weak when I take it; like I'll pass out" Has patient had a PCN reaction causing immediate rash, facial/tongue/throat swelling, SOB or lightheadedness with  hypotension: no Has patient had a PCN reaction causing severe rash involving mucus membranes or skin necrosis: no Has patient had a PCN reaction that required hospitalization no Has patient had a PCN reaction occurring within the last 10 years: no If all of the above   Bactrim [sulfamethoxazole-trimethoprim] Rash   Contrast Media [iodinated Diagnostic Agents] Itching   Iron Itching, Other (See Comments)   "they  gave me iron in dialysis; had to give me Benadryl cause I had to have the iron" (05/02/2012)   Nitrofurantoin Hives   Prilosec [omeprazole] Other (See Comments)   "back spasms"   Tylenol [acetaminophen] Itching, Other (See Comments)   Makes her feet on fire per pt   Dexilant [dexlansoprazole] Other (See Comments)   Upset stomach   Levaquin [levofloxacin In D5w] Rash   Morphine And Related Itching   Itching in feet   Plavix [clopidogrel Bisulfate] Rash   Protonix [pantoprazole Sodium] Rash   Venofer [ferric Oxide] Itching   Patient reports using Benadryl prior to doses as [place]      Medication List    STOP taking these medications   metoprolol tartrate 25 MG tablet Commonly known as:  LOPRESSOR   pantoprazole 40 MG tablet Commonly known as:  PROTONIX     TAKE these medications   ALPRAZolam 0.25 MG tablet Commonly known as:  XANAX Take 0.25 mg by mouth 2 (two) times daily as needed for anxiety.   amiodarone 200 MG tablet Commonly known as:  PACERONE Take 1 tablet (200 mg total) by mouth daily.   amLODipine 2.5 MG tablet Commonly known as:  NORVASC Take 2.5 mg by mouth daily.   aspirin EC 81 MG tablet Take 81 mg by mouth daily.   atorvastatin 40 MG tablet Commonly known as:  LIPITOR Take 1 tablet (40 mg total) by mouth daily at 6 PM. What changed:  when to take this   carvedilol 6.25 MG tablet Commonly known as:  COREG Take 1 tablet (6.25 mg total) by mouth 2 (two) times daily with a meal.   cinacalcet 30 MG tablet Commonly known as:  SENSIPAR Take 30 mg by mouth daily after supper.   cloNIDine 0.2 MG tablet Commonly known as:  CATAPRES Take 1 tablet (0.2 mg total) by mouth 2 (two) times daily.   epoetin alfa 10000 UNIT/ML injection Commonly known as:  EPOGEN,PROCRIT Inject 1 mL (10,000 Units total) into the vein every Monday, Wednesday, and Friday with hemodialysis.   fluconazole 150 MG tablet Commonly known as:  DIFLUCAN Take 150 mg by mouth  every other day. #5/10 days filled 10/19/16   fluticasone 50 MCG/ACT nasal spray Commonly known as:  FLONASE Place 2 sprays into the nose daily as needed for allergies.   folic acid-vitamin b complex-vitamin c-selenium-zinc 3 MG Tabs tablet Take 1 tablet by mouth daily.   hydrALAZINE 50 MG tablet Commonly known as:  APRESOLINE Take 1.5 tablets (75 mg total) by mouth 3 (three) times daily.   isosorbide mononitrate 120 MG 24 hr tablet Commonly known as:  IMDUR Take 1 tablet (120 mg total) by mouth daily.   lanthanum 1000 MG chewable tablet Commonly known as:  FOSRENOL Chew 1 tablet (1,000 mg total) by mouth 3 (three) times daily with meals. What changed:  when to take this   loratadine 10 MG tablet Commonly known as:  CLARITIN Take 10 mg by mouth daily as needed for allergies.   nitroGLYCERIN 0.4 MG SL tablet Commonly known as:  NITROSTAT  Place 1 tablet (0.4 mg total) under the tongue every 5 (five) minutes as needed for chest pain.   omeprazole 20 MG capsule Commonly known as:  PRILOSEC Take 20 mg by mouth daily as needed (acid reflux/heartburn).   ticagrelor 90 MG Tabs tablet Commonly known as:  BRILINTA Take 1 tablet (90 mg total) by mouth 2 (two) times daily.      Follow-up Information    [name], MD. Schedule an appointment as soon as possible for a visit in 1 week(s).   Specialty:  Family Medicine Contact information: [place] [address] [phone]        Hildred Laser, MD. Schedule an appointment as soon as possible for a visit in 2 week(s).   Specialty:  Gastroenterology Why:  Hospital Follow Up  Contact information: [address], [address] [place] [address] [phone]          Allergies  Allergen Reactions  . Aspirin Other (See Comments)    High Doses Mess up her stomach; "makes my bowels have blood in them". Takes 81 mg EC Aspirin   . Penicillins Other (See Comments)    SYNCOPE? , "makes me real weak when I take it;  like I'll pass out"  Has patient had a PCN reaction causing immediate rash, facial/tongue/throat swelling, SOB or lightheadedness with hypotension: no Has patient had a PCN reaction causing severe rash involving mucus membranes or skin necrosis: no Has patient had a PCN reaction that required hospitalization no Has patient had a PCN reaction occurring within the last 10 years: no If all of the above  . Bactrim [Sulfamethoxazole-Trimethoprim] Rash  . Contrast Media [Iodinated Diagnostic Agents] Itching  . Iron Itching and Other (See Comments)    "they gave me iron in dialysis; had to give me Benadryl cause I had to have the iron" (05/02/2012)  . Nitrofurantoin Hives  . Prilosec [Omeprazole] Other (See Comments)    "back spasms"  . Tylenol [Acetaminophen] Itching and Other (See Comments)    Makes her feet on fire per pt  . Dexilant [Dexlansoprazole] Other (See Comments)    Upset stomach  . Levaquin [Levofloxacin In D5w] Rash  . Morphine And Related Itching    Itching in feet  . Plavix [Clopidogrel Bisulfate] Rash  . Protonix [Pantoprazole Sodium] Rash  . Venofer [Ferric Oxide] Itching    Patient reports using Benadryl prior to doses as [place]   Procedures/Studies: Dg Chest 2 View  Result Date: 10/03/2016 CLINICAL DATA:  Acute onset of high blood pressure and headache. Initial encounter. EXAM: CHEST  2 VIEW COMPARISON:  Chest radiograph performed 09/19/2016 FINDINGS: The lungs are well-aerated. Mild left basilar atelectasis is again noted. There is no evidence of focal opacification, pleural effusion or pneumothorax. The heart is borderline enlarged. The patient is status post median sternotomy, with evidence of prior CABG. No acute osseous abnormalities are seen. A right-sided dual-lumen catheter is noted ending about the cavoatrial junction. Scattered calcification is seen along the proximal abdominal aorta. IMPRESSION: 1. Mild left basilar atelectasis again noted. Lungs otherwise  clear. 2. Borderline cardiomegaly. 3. Scattered aortic atherosclerosis. Electronically Signed   By: Garald Balding M.D.   On: 10/03/2016 00:30   Dg Chest Port 1 View  Result Date: 10/12/2016 CLINICAL DATA:  Pulmonary edema, chest pain last night EXAM: PORTABLE CHEST 1 VIEW COMPARISON:  10/10/2016 FINDINGS: Cardiomegaly again noted. Status post CABG. Double lumen right IJ catheter is unchanged in position. Improvement in aeration  without convincing pulmonary edema. Persistent trace bilateral pleural effusion with bilateral basilar atelectasis or infiltrate. IMPRESSION: Status post CABG. Double lumen right IJ catheter is unchanged in position. Improvement in aeration without convincing pulmonary edema. Persistent trace bilateral pleural effusion with bilateral basilar atelectasis or infiltrate. Electronically Signed   By: Lahoma Crocker M.D.   On: 10/12/2016 08:50   Dg Chest Port 1 View  Result Date: 10/10/2016 CLINICAL DATA:  Initial evaluation for pulmonary edema. Shortness of breath with chest pain. EXAM: PORTABLE CHEST 1 VIEW COMPARISON:  Prior radiograph from 10/09/2016. FINDINGS: A right-sided dual-lumen dialysis catheter in place, stable. Median sternotomy wires with underlying CABG markers and surgical clips unchanged. Stable cardiomegaly. Mediastinal silhouette within normal limits. Aortic atherosclerosis. Lungs normally inflated. Diffuse vascular congestion with scattered interstitial prominence in Kerley B-lines, compatible pulmonary edema, worse within the right lung. Small bilateral pleural effusions, left greater than right. No focal infiltrates. No pneumothorax. No acute osseous abnormality. IMPRESSION: 1. Cardiomegaly with mild diffuse pulmonary edema and small bilateral pleural effusions, consistent with acute CHF exacerbation. 2. Sequelae of prior CABG. 3. Aortic atherosclerosis. Electronically Signed   By: Jeannine Boga M.D.   On: 10/10/2016 15:48   Dg Chest Portable 1 View  Result  Date: 10/09/2016 CLINICAL DATA:  Severe dyspnea, cough and congestion EXAM: PORTABLE CHEST 1 VIEW COMPARISON:  10/03/2016 FINDINGS: Cardiomegaly with aortic atherosclerosis. [place] B-lines consistent with CHF are identified bilaterally. Median sternotomy sutures and right-sided dialysis catheter tips are again visualized without significant change. Superimposed airspace disease about the right hilum and right lung base cannot be entirely excluded. No acute osseous abnormality. IMPRESSION: Cardiomegaly with aortic atherosclerosis. Interval development of CHF. Superimposed airspace disease/pneumonia at the right lung base and about the right hilum is not entirely excluded. Electronically Signed   By: Ashley Royalty M.D.   On: 10/09/2016 23:56   US Abdomen Limited Ruq  Result Date: 10/03/2016 CLINICAL DATA:  Right upper quadrant pain, onset yesterday. EXAM: US ABDOMEN LIMITED - RIGHT UPPER QUADRANT COMPARISON:  Ultrasound 03/22/2016, CT 03/03/2016 FINDINGS: Gallbladder: Physiologically distended. No gallstones. Mild gallbladder wall thickening measuring 3-4 mm. Trace pericholecystic fluid. No sonographic Murphy sign noted by sonographer. Common bile duct: Diameter: 2 mm. Liver: No focal lesion identified. Within normal limits in parenchymal echogenicity. Normal directional flow in the main portal vein. Incidental note of increased right renal echogenicity and right renal cyst. IMPRESSION: 1. Borderline gallbladder wall thickening with trace pericholecystic fluid, nonspecific. This can be seen in the setting of systemic causes such as liver disease or cardiac failure, or primary gallbladder inflammation. No gallstones or sonographic Murphy sign. 2. No biliary dilatation. Unremarkable sonographic appearance of the liver. Electronically Signed   By: Jeb Levering M.D.   On: 10/03/2016 04:13     Subjective: Pt says she does feel stronger today after the blood transfusion and her Hg is up to 9.9 this morning. She  wants to go home.  Husband at bedside.   Discharge Exam: Vitals:   10/25/16 0900 10/25/16 1000  BP: (!) 187/74 (!) 181/55  Pulse:  79  Resp:  15  Temp:  98.5 F (36.9 C)   Vitals:   10/25/16 0545 10/25/16 0647 10/25/16 0900 10/25/16 1000  BP: (!) 172/60 (!) 173/63 (!) 187/74 (!) 181/55  Pulse: 63 64  79  Resp: 18 18  15   Temp: 98.1 F (36.7 C) 98.2 F (36.8 C)  98.5 F (36.9 C)  TempSrc: Oral Oral  Oral  SpO2: 97% 97%  95%  Weight:      Height:       Eyes: PERRL, lids and conjunctivae normal ENMT: Mucous membranes are slightly dry.  Lips and gums are pale.  Posterior pharynx clear of any exudate or lesions. Normal dentition.  Neck: normal appearance, supple, no masses Respiratory: clear to auscultation bilaterally, no wheezing, no crackles. Normal respiratory effort. No accessory muscle use.  Cardiovascular: Normal rate, regular rhythm, no murmurs / rubs / gallops. No extremity edema. 2+ pedal pulses. GI: abdomen is soft and compressible.  No distention. No tenderness.  BS are normal. Musculoskeletal:  No joint deformity in lower extremities. LUE AV fistula (nonfunctional per patient).  Good ROM, no contractures. Normal muscle tone.  Skin: no rashes, warm and dry Neurologic: Sensation intact, Strength symmetric bilaterally.  Face symmetric.  EOMI.  Tongue is midline. Psychiatric: Normal judgment and insight. Alert and oriented x 3. Normal mood.    The results of significant diagnostics from this hospitalization (including imaging, microbiology, ancillary and laboratory) are listed below for reference.     Microbiology: Recent Results (from the past 240 hour(s))  MRSA PCR Screening     Status: None   Collection Time: 10/24/16 11:25 PM  Result Value Ref Range Status   MRSA by PCR NEGATIVE NEGATIVE Final    Comment:        The GeneXpert MRSA Assay (FDA approved for NASAL specimens only), is one component of a comprehensive MRSA colonization surveillance program. It is  not intended to diagnose MRSA infection nor to guide or monitor treatment for MRSA infections.      Labs: BNP (last 3 results)  Recent Labs  07/16/16 1517  BNP 193.7*   Basic Metabolic Panel:  Recent Labs Lab 10/24/16 1700 10/25/16 0748  NA 132* 134*  K 3.1* 4.4  CL 92* 94*  CO2 29 28  GLUCOSE 121* 89  BUN 9 22*  CREATININE 2.88* 4.70*  CALCIUM 8.6* 9.3   Liver Function Tests:  Recent Labs Lab 10/24/16 1700  AST 33  ALT 31  ALKPHOS 63  BILITOT 0.3  PROT 6.0*  ALBUMIN 3.0*   No results for input(s): LIPASE, AMYLASE in the last 168 hours. No results for input(s): AMMONIA in the last 168 hours. CBC:  Recent Labs Lab 10/24/16 1700 10/25/16 0748  WBC 7.7  --   NEUTROABS 5.8  --   HGB 7.3* 9.9*  HCT 22.6* 30.4*  MCV 95.8  --   PLT 304  --    Cardiac Enzymes: No results for input(s): CKTOTAL, CKMB, CKMBINDEX, TROPONINI in the last 168 hours. BNP: Invalid input(s): POCBNP CBG: No results for input(s): GLUCAP in the last 168 hours. D-Dimer No results for input(s): DDIMER in the last 72 hours. Hgb A1c No results for input(s): HGBA1C in the last 72 hours. Lipid Profile No results for input(s): CHOL, HDL, LDLCALC, TRIG, CHOLHDL, LDLDIRECT in the last 72 hours. Thyroid function studies No results for input(s): TSH, T4TOTAL, T3FREE, THYROIDAB in the last 72 hours.  Invalid input(s): FREET3 Anemia work up No results for input(s): VITAMINB12, FOLATE, FERRITIN, TIBC, IRON, RETICCTPCT in the last 72 hours. Urinalysis    Component Value Date/Time   COLORURINE YELLOW 12/16/2012 1919   APPEARANCEUR CLOUDY (A) 12/16/2012 1919   LABSPEC 1.009 12/16/2012 1919   PHURINE 7.5 12/16/2012 1919   GLUCOSEU NEGATIVE 12/16/2012 1919   HGBUR TRACE (A) 12/16/2012 1919   BILIRUBINUR NEGATIVE 12/16/2012 1919   KETONESUR NEGATIVE 12/16/2012 1919   PROTEINUR 100 (A) 12/16/2012 1919  UROBILINOGEN 0.2 12/16/2012 1919   NITRITE NEGATIVE 12/16/2012 1919   LEUKOCYTESUR  SMALL (A) 12/16/2012 1919   Sepsis Labs Invalid input(s): PROCALCITONIN,  WBC,  LACTICIDVEN Microbiology Recent Results (from the past 240 hour(s))  MRSA PCR Screening     Status: None   Collection Time: 10/24/16 11:25 PM  Result Value Ref Range Status   MRSA by PCR NEGATIVE NEGATIVE Final    Comment:        The GeneXpert MRSA Assay (FDA approved for NASAL specimens only), is one component of a comprehensive MRSA colonization surveillance program. It is not intended to diagnose MRSA infection nor to guide or monitor treatment for MRSA infections.    Time coordinating discharge: 30 minutes  SIGNED:  Irwin Brakeman, MD  [facility] 10/25/2016, 12:17 PM Pager   If 7PM-7AM, please contact night-coverage [email or web address] Password [email or web address]

## 2016-10-26 DIAGNOSIS — N186 End stage renal disease: Secondary | ICD-10-CM | POA: Diagnosis not present

## 2016-10-26 DIAGNOSIS — Z992 Dependence on renal dialysis: Secondary | ICD-10-CM | POA: Diagnosis not present

## 2016-10-26 LAB — BPAM RBC
BLOOD PRODUCT EXPIRATION DATE: 201805102359
Blood Product Expiration Date: 201805102359
ISSUE DATE / TIME: 201804162341
ISSUE DATE / TIME: 201804170233
UNIT TYPE AND RH: 5100
Unit Type and Rh: 5100

## 2016-10-26 LAB — TYPE AND SCREEN
ABO/RH(D): O POS
Antibody Screen: POSITIVE
DAT, IgG: NEGATIVE
Donor AG Type: NEGATIVE
Donor AG Type: NEGATIVE
UNIT DIVISION: 0
UNIT DIVISION: 0

## 2016-10-28 DIAGNOSIS — Z992 Dependence on renal dialysis: Secondary | ICD-10-CM | POA: Diagnosis not present

## 2016-10-28 DIAGNOSIS — N186 End stage renal disease: Secondary | ICD-10-CM | POA: Diagnosis not present

## 2016-10-29 DIAGNOSIS — E8779 Other fluid overload: Secondary | ICD-10-CM | POA: Diagnosis not present

## 2016-10-29 DIAGNOSIS — N186 End stage renal disease: Secondary | ICD-10-CM | POA: Diagnosis not present

## 2016-10-29 DIAGNOSIS — Z992 Dependence on renal dialysis: Secondary | ICD-10-CM | POA: Diagnosis not present

## 2016-10-31 DIAGNOSIS — Z992 Dependence on renal dialysis: Secondary | ICD-10-CM | POA: Diagnosis not present

## 2016-10-31 DIAGNOSIS — N186 End stage renal disease: Secondary | ICD-10-CM | POA: Diagnosis not present

## 2016-11-02 DIAGNOSIS — N186 End stage renal disease: Secondary | ICD-10-CM | POA: Diagnosis not present

## 2016-11-02 DIAGNOSIS — Z992 Dependence on renal dialysis: Secondary | ICD-10-CM | POA: Diagnosis not present

## 2016-11-04 ENCOUNTER — Encounter: Payer: Medicare HMO | Admitting: Cardiovascular Disease

## 2016-11-04 DIAGNOSIS — N186 End stage renal disease: Secondary | ICD-10-CM | POA: Diagnosis not present

## 2016-11-04 DIAGNOSIS — Z992 Dependence on renal dialysis: Secondary | ICD-10-CM | POA: Diagnosis not present

## 2016-11-07 DIAGNOSIS — Z992 Dependence on renal dialysis: Secondary | ICD-10-CM | POA: Diagnosis not present

## 2016-11-07 DIAGNOSIS — N186 End stage renal disease: Secondary | ICD-10-CM | POA: Diagnosis not present

## 2016-11-08 DIAGNOSIS — Z992 Dependence on renal dialysis: Secondary | ICD-10-CM | POA: Diagnosis not present

## 2016-11-08 DIAGNOSIS — N186 End stage renal disease: Secondary | ICD-10-CM | POA: Diagnosis not present

## 2016-11-09 DIAGNOSIS — N186 End stage renal disease: Secondary | ICD-10-CM | POA: Diagnosis not present

## 2016-11-09 DIAGNOSIS — Z992 Dependence on renal dialysis: Secondary | ICD-10-CM | POA: Diagnosis not present

## 2016-11-10 ENCOUNTER — Other Ambulatory Visit: Payer: Self-pay | Admitting: Physician Assistant

## 2016-11-10 ENCOUNTER — Ambulatory Visit (INDEPENDENT_AMBULATORY_CARE_PROVIDER_SITE_OTHER): Payer: Medicare HMO | Admitting: Cardiovascular Disease

## 2016-11-10 ENCOUNTER — Encounter: Payer: Self-pay | Admitting: Cardiovascular Disease

## 2016-11-10 VITALS — BP 184/98 | HR 65 | Ht 62.0 in | Wt 125.0 lb

## 2016-11-10 DIAGNOSIS — I10 Essential (primary) hypertension: Secondary | ICD-10-CM | POA: Diagnosis not present

## 2016-11-10 DIAGNOSIS — N186 End stage renal disease: Secondary | ICD-10-CM

## 2016-11-10 DIAGNOSIS — I214 Non-ST elevation (NSTEMI) myocardial infarction: Secondary | ICD-10-CM | POA: Diagnosis not present

## 2016-11-10 DIAGNOSIS — I5032 Chronic diastolic (congestive) heart failure: Secondary | ICD-10-CM

## 2016-11-10 DIAGNOSIS — I25768 Atherosclerosis of bypass graft of coronary artery of transplanted heart with other forms of angina pectoris: Secondary | ICD-10-CM

## 2016-11-10 DIAGNOSIS — Z9289 Personal history of other medical treatment: Secondary | ICD-10-CM | POA: Diagnosis not present

## 2016-11-10 DIAGNOSIS — Z955 Presence of coronary angioplasty implant and graft: Secondary | ICD-10-CM | POA: Diagnosis not present

## 2016-11-10 DIAGNOSIS — E7800 Pure hypercholesterolemia, unspecified: Secondary | ICD-10-CM

## 2016-11-10 DIAGNOSIS — I6523 Occlusion and stenosis of bilateral carotid arteries: Secondary | ICD-10-CM

## 2016-11-10 MED ORDER — HYDRALAZINE HCL 50 MG PO TABS: 100.0000 mg | ORAL_TABLET | Freq: Three times a day (TID) | ORAL | 3 refills | Status: DC

## 2016-11-10 NOTE — Progress Notes (Signed)
SUBJECTIVE: The patient presents for follow-up after being hospitalized for a non-STEMI in April 2018. Troponins peaked at 23.73. She underwent percutaneous coronary intervention to a subtotally occluded diagonal vessel with a drug-eluting stent. She had a patent LIMA graft to the LAD and a patent SVG supplying the ramus intermedius and patent SVG supplying the distal RCA.  Echocardiogram on 10/12/16 showed normal left ventricular systolic function and regional wall motion, LVEF 27-06%, grade 1 diastolic dysfunction, elevated filling pressures.  She said overall she has been feeling better but she feels tired today. She denies chest pain, shortness of breath, and headaches. Blood pressure is 184/98 today. Systolic had been over 237 yesterday morning when she woke up.  She said amlodipine causes her feet swell. She is on 2.5 mg but does not take it daily.   Review of Systems: As per "subjective", otherwise negative.  Allergies  Allergen Reactions  . Aspirin Other (See Comments)    High Doses Mess up her stomach; "makes my bowels have blood in them". Takes 81 mg EC Aspirin   . Penicillins Other (See Comments)    SYNCOPE? , "makes me real weak when I take it; like I'll pass out"  Has patient had a PCN reaction causing immediate rash, facial/tongue/throat swelling, SOB or lightheadedness with hypotension: no Has patient had a PCN reaction causing severe rash involving mucus membranes or skin necrosis: no Has patient had a PCN reaction that required hospitalization no Has patient had a PCN reaction occurring within the last 10 years: no If all of the above  . Bactrim [Sulfamethoxazole-Trimethoprim] Rash  . Contrast Media [Iodinated Diagnostic Agents] Itching  . Iron Itching and Other (See Comments)    "they gave me iron in dialysis; had to give me Benadryl cause I had to have the iron" (05/02/2012)  . Nitrofurantoin Hives  . Prilosec [Omeprazole] Other (See Comments)    "back spasms"   . Tylenol [Acetaminophen] Itching and Other (See Comments)    Makes her feet on fire per pt  . Dexilant [Dexlansoprazole] Other (See Comments)    Upset stomach  . Levaquin [Levofloxacin In D5w] Rash  . Morphine And Related Itching    Itching in feet  . Plavix [Clopidogrel Bisulfate] Rash  . Protonix [Pantoprazole Sodium] Rash  . Venofer [Ferric Oxide] Itching    Patient reports using Benadryl prior to doses as [facility]    Current Outpatient Prescriptions  Medication Sig Dispense Refill  . ALPRAZolam (XANAX) 0.25 MG tablet Take 0.25 mg by mouth 2 (two) times daily as needed for anxiety.     Marland Kitchen amiodarone (PACERONE) 200 MG tablet Take 1 tablet (200 mg total) by mouth daily. 30 tablet 6  . amLODipine (NORVASC) 2.5 MG tablet Take 2.5 mg by mouth daily.    Marland Kitchen aspirin EC 81 MG tablet Take 81 mg by mouth daily.     Marland Kitchen atorvastatin (LIPITOR) 40 MG tablet Take 1 tablet (40 mg total) by mouth daily at 6 PM. (Patient taking differently: Take 40 mg by mouth at bedtime. ) 30 tablet 6  . carvedilol (COREG) 6.25 MG tablet Take 1 tablet (6.25 mg total) by mouth 2 (two) times daily with a meal. 60 tablet 0  . cinacalcet (SENSIPAR) 30 MG tablet Take 30 mg by mouth daily after supper.    . cloNIDine (CATAPRES) 0.2 MG tablet Take 1 tablet (0.2 mg total) by mouth 2 (two) times daily. 60 tablet 3  . epoetin alfa (EPOGEN,PROCRIT) 62831  UNIT/ML injection Inject 1 mL (10,000 Units total) into the vein every Monday, Wednesday, and Friday with hemodialysis. 1 mL 10  . fluconazole (DIFLUCAN) 150 MG tablet Take 150 mg by mouth every other day. #5/10 days filled 10/19/16    . fluticasone (FLONASE) 50 MCG/ACT nasal spray Place 2 sprays into the nose daily as needed for allergies.     . folic acid-vitamin b complex-vitamin c-selenium-zinc (DIALYVITE) 3 MG TABS tablet Take 1 tablet by mouth daily.    . hydrALAZINE (APRESOLINE) 50 MG tablet Take 1.5 tablets (75 mg total) by mouth 3 (three) times daily. 135 tablet 6  .  isosorbide mononitrate (IMDUR) 120 MG 24 hr tablet Take 1 tablet (120 mg total) by mouth daily. 30 tablet 2  . lanthanum (FOSRENOL) 1000 MG chewable tablet Chew 1 tablet (1,000 mg total) by mouth 3 (three) times daily with meals. (Patient taking differently: Chew 1,000 mg by mouth 3 (three) times daily after meals. ) 90 tablet 1  . loratadine (CLARITIN) 10 MG tablet Take 10 mg by mouth daily as needed for allergies.    . nitroGLYCERIN (NITROSTAT) 0.4 MG SL tablet Place 1 tablet (0.4 mg total) under the tongue every 5 (five) minutes as needed for chest pain. 30 tablet 1  . omeprazole (PRILOSEC) 20 MG capsule Take 20 mg by mouth daily as needed (acid reflux/heartburn).     . ticagrelor (BRILINTA) 90 MG TABS tablet Take 1 tablet (90 mg total) by mouth 2 (two) times daily. 180 tablet 3   No current facility-administered medications for this visit.     Past Medical History:  Diagnosis Date  . Acute on chronic respiratory failure with hypoxia ([place]) 10/10/2016  . Anxiety   . Arthritis   . AVM (arteriovenous malformation) of colon   . CAD (coronary artery disease)    a. 12/2011 NSTEMI/Cath/PCI LCX (2.25x14 Resolute DES) & D1 (2.25x22 Resolute DES);  b. 01/2012 Cath/PCI: LM 30, LAD 30p, 40-50m, D1 stent ok, 99 in sm branch of diag, LCX patent stent, OM1 20, RCA 95 ost (4.0x12 Promus DES), EF 55%;  c. 04/2012 Lexi Cardiolite  EF 48%, small area of scar @ base/mid inflat wall with mild peri-infarct ischemia.; CABG 12/4  . Carotid artery disease ([place])    a. 32-20% LICA, 08/5425   . Chronic bronchitis ([place])   . Chronic diastolic CHF (congestive heart failure) ([place])    a. 02/2012 Echo EF 60-65%, nl wall motion, Gr 1 DD, mod MR  . Colon cancer ([place]) 1992  . Esophageal stricture   . ESRD on hemodialysis ([place])    ESRD due to HTN, started dialysis 2011 and gets HD at [facility] with Dr Hinda Lenis on MWF schedule.  Access is LUA AVF as of Sept 2014.   Marland Kitchen GERD (gastroesophageal reflux disease)   . High cholesterol  12/2011  . History of blood transfusion 07/2011; 12/2011; 01/2012 X 2; 04/2012  . History of gout   . History of lower GI bleeding   . Hypertension   . Iron deficiency anemia   . Mitral regurgitation    a. Moderate by echo, 02/2012  . Myocardial infarction ([place])   . Ovarian cancer ([place]) 1992  . Pneumonia ~ 2009  . PUD (peptic ulcer disease)   . TIA (transient ischemic attack)     Past Surgical History:  Procedure Laterality Date  . ABDOMINAL HYSTERECTOMY  1992  . APPENDECTOMY  06/1990  . AV FISTULA PLACEMENT  07/2009   left upper arm  .  AV FISTULA PLACEMENT Right 09/06/2016   Procedure: RIGHT FOREARM ARTERIOVENOUS (AV) GRAFT;  Surgeon: Elam Dutch, MD;  Location: [place];  Service: Vascular;  Laterality: Right;  . [place] REMOVAL Right 09/06/2016   Procedure: REMOVAL OF Right Arm ARTERIOVENOUS GORETEX GRAFT and Vein Patch angioplasty of brachial artery;  Surgeon: Angelia Mould, MD;  Location: [place];  Service: Vascular;  Laterality: Right;  . COLON RESECTION  1992  . CORONARY ANGIOPLASTY WITH STENT PLACEMENT  12/15/11   "2"  . CORONARY ANGIOPLASTY WITH STENT PLACEMENT  y/2013   "1; makes total of 3" (05/02/2012)  . CORONARY ARTERY BYPASS GRAFT  06/13/2012   Procedure: CORONARY ARTERY BYPASS GRAFTING (CABG);  Surgeon: Grace Isaac, MD;  Location: [place];  Service: Open Heart Surgery;  Laterality: N/A;  cabg x four;  using left internal mammary artery, and left leg greater saphenous vein harvested endoscopically  . CORONARY STENT INTERVENTION N/A 10/13/2016   Procedure: Coronary Stent Intervention;  Surgeon: Troy Sine, MD;  Location: [place] CV LAB;  Service: Cardiovascular;  Laterality: N/A;  . DILATION AND CURETTAGE OF UTERUS    . ESOPHAGOGASTRODUODENOSCOPY  01/20/2012   Procedure: ESOPHAGOGASTRODUODENOSCOPY (EGD);  Surgeon: Ladene Artist, MD,FACG;  Location: [facility] ENDOSCOPY;  Service: Endoscopy;  Laterality: N/A;  . ESOPHAGOGASTRODUODENOSCOPY N/A 03/26/2013   Procedure:  ESOPHAGOGASTRODUODENOSCOPY (EGD);  Surgeon: Irene Shipper, MD;  Location: [facility] ENDOSCOPY;  Service: Endoscopy;  Laterality: N/A;  . ESOPHAGOGASTRODUODENOSCOPY N/A 04/30/2015   Procedure: ESOPHAGOGASTRODUODENOSCOPY (EGD);  Surgeon: Rogene Houston, MD;  [facility];  Service: Endoscopy;  Laterality: N/A;  1pm - moved to 10/20 @ 1:10  . ESOPHAGOGASTRODUODENOSCOPY N/A 07/29/2016   Procedure: ESOPHAGOGASTRODUODENOSCOPY (EGD);  Surgeon: Manus Gunning, MD;  Location: [place];  Service: Gastroenterology;  Laterality: N/A;  enteroscopy  . INTRAOPERATIVE TRANSESOPHAGEAL ECHOCARDIOGRAM  06/13/2012   Procedure: INTRAOPERATIVE TRANSESOPHAGEAL ECHOCARDIOGRAM;  Surgeon: Grace Isaac, MD;  Location: [place];  Service: Open Heart Surgery;  Laterality: N/A;  . IR GENERIC HISTORICAL  07/26/2016   IR FLUORO GUIDE CV LINE RIGHT 07/26/2016 Greggory Keen, MD [facility]INTER[facility]  . IR GENERIC HISTORICAL  07/26/2016   IR US GUIDE VASC ACCESS RIGHT 07/26/2016 Greggory Keen, MD [facility]  . IR GENERIC HISTORICAL  08/02/2016   IR US GUIDE VASC ACCESS RIGHT 08/02/2016 Greggory Keen, MD [facility]  . IR GENERIC HISTORICAL  08/02/2016   IR FLUORO GUIDE CV LINE RIGHT 08/02/2016 Greggory Keen, MD [facility]  . LEFT HEART CATH AND CORONARY ANGIOGRAPHY N/A 09/20/2016   Procedure: Left Heart Cath and Coronary Angiography;  Surgeon: [name], MD;  Location: [place] CV LAB;  Service: Cardiovascular;  Laterality: N/A;  . LEFT HEART CATH AND CORS/GRAFTS ANGIOGRAPHY N/A 10/13/2016   Procedure: Left Heart Cath and Cors/Grafts Angiography;  Surgeon: Troy Sine, MD;  Location: [place] CV LAB;  Service: Cardiovascular;  Laterality: N/A;  . LEFT HEART CATHETERIZATION WITH CORONARY ANGIOGRAM N/A 12/15/2011   Procedure: LEFT HEART CATHETERIZATION WITH CORONARY ANGIOGRAM;  Surgeon: Burnell Blanks, MD;  Location: [facility] CATH LAB;  Service: Cardiovascular;  Laterality: N/A;  . LEFT HEART CATHETERIZATION WITH  CORONARY ANGIOGRAM N/A 01/10/2012   Procedure: LEFT HEART CATHETERIZATION WITH CORONARY ANGIOGRAM;  Surgeon: Peter M Martinique, MD;  Location: [facility] CATH LAB;  Service: Cardiovascular;  Laterality: N/A;  . LEFT HEART CATHETERIZATION WITH CORONARY ANGIOGRAM N/A 06/08/2012   Procedure: LEFT HEART CATHETERIZATION WITH CORONARY ANGIOGRAM;  Surgeon: Burnell Blanks, MD;  Location:  [place] CATH LAB;  Service: Cardiovascular;  Laterality: N/A;  . LEFT HEART CATHETERIZATION WITH CORONARY/GRAFT ANGIOGRAM N/A 12/10/2013   Procedure: LEFT HEART CATHETERIZATION WITH Beatrix Fetters;  Surgeon: [name], MD;  Location: [facility] C[facility] LAB;  Service: Cardiovascular;  Laterality: N/A;  . OVARY SURGERY     ovarian cancer  . SHUNTOGRAM N/A 10/15/2013   Procedure: Fistulogram;  Surgeon: Serafina Mitchell, MD;  Location: [facility] CATH LAB;  Service: Cardiovascular;  Laterality: N/A;  . THROMBECTOMY / ARTERIOVENOUS GRAFT REVISION  2011   left upper arm  . TUBAL LIGATION  1980's    Social History   Social History  . Marital status: Married    Spouse name: N/A  . Number of children: N/A  . Years of education: N/A   Occupational History  . Not on file.   Social History Main Topics  . Smoking status: Never Smoker  . Smokeless tobacco: Never Used  . Alcohol use No  . Drug use: No  . Sexual activity: Yes    Birth control/ protection: Surgical   Other Topics Concern  . Not on file   Social History Narrative   Lives in [place], [place] with husband.  Dialysis pt - [facility].     Vitals:   11/10/16 1351  BP: (!) 184/98  Pulse: 65  SpO2: 95%  Weight: 125 lb (56.7 kg)  Height: 5\' 2"  (1.575 m)    Wt Readings from Last 3 Encounters:  11/10/16 125 lb (56.7 kg)  10/24/16 121 lb (54.9 kg)  10/14/16 124 lb 9 oz (56.5 kg)     PHYSICAL EXAM General: NAD HEENT: Normal. Neck: No JVD, no thyromegaly. Lungs: Clear to auscultation bilaterally with normal respiratory effort. CV: Nondisplaced PMI.  Regular rate and  rhythm, normal S1/S2, no S3/S4, no murmur. No pretibial or periankle edema.    Abdomen: Soft, nontender, no distention.  Neurologic: Alert and oriented.  Psych: Normal affect. Skin: Normal. Musculoskeletal: No gross deformities.    ECG: Most recent ECG reviewed.   Labs: Lab Results  Component Value Date/Time   K 4.4 10/25/2016 07:48 AM   BUN 22 (H) 10/25/2016 07:48 AM   CREATININE 4.70 (H) 10/25/2016 07:48 AM   ALT 31 10/24/2016 05:00 PM   TSH 1.536 05/19/2016 11:13 AM   TSH 2.864 08/24/2014 10:00 AM   HGB 9.9 (L) 10/25/2016 07:48 AM     Lipids: Lab Results  Component Value Date/Time   LDLCALC 84 09/20/2016 03:47 AM   CHOL 169 09/20/2016 03:47 AM   TRIG 76 09/20/2016 03:47 AM   HDL 70 09/20/2016 03:47 AM       ASSESSMENT AND PLAN: 1. CAD with h/o 4-vessel CABG with recent NSTEMI and PCI of diagonal: Symptomatically stable. Coronary angiography detailed above. Continue aspirin, Coreg, nitrates, Brilinta, and Lipitor.  2. Malignant HTN: Markedly elevated. I will increase hydralazine to 100 mg 3 times daily. She is also on Coreg, clonidine, and low dose amlodipine.  3. Hyperlipidemia: Continue Lipitor.  4. Carotid artery stenosis: Stable. Continue surveillance monitoring. Continue aspirin and statin.  6. PSVT: I will stop amiodarone and continue Coreg.    Disposition: Follow up 3 months  Time spent: 40 minutes, of which greater than 50% was spent reviewing symptoms, relevant blood tests and studies, and discussing management plan with the patient.   Kate Sable, M.D., F.A.C.C.

## 2016-11-10 NOTE — Patient Instructions (Signed)
Your physician recommends that you schedule a follow-up appointment in: 3 months in [place] office, Dr Bronson Ing    STOP Amiodarone   INCREASE  Hydralazine to 100 mg three times a day       Thank you for choosing [facility] !

## 2016-11-11 DIAGNOSIS — N186 End stage renal disease: Secondary | ICD-10-CM | POA: Diagnosis not present

## 2016-11-11 DIAGNOSIS — Z992 Dependence on renal dialysis: Secondary | ICD-10-CM | POA: Diagnosis not present

## 2016-11-12 DIAGNOSIS — E785 Hyperlipidemia, unspecified: Secondary | ICD-10-CM | POA: Diagnosis not present

## 2016-11-12 DIAGNOSIS — R69 Illness, unspecified: Secondary | ICD-10-CM | POA: Diagnosis not present

## 2016-11-12 DIAGNOSIS — Z955 Presence of coronary angioplasty implant and graft: Secondary | ICD-10-CM | POA: Diagnosis not present

## 2016-11-12 DIAGNOSIS — N186 End stage renal disease: Secondary | ICD-10-CM | POA: Diagnosis not present

## 2016-11-12 DIAGNOSIS — Z0000 Encounter for general adult medical examination without abnormal findings: Secondary | ICD-10-CM | POA: Diagnosis not present

## 2016-11-12 DIAGNOSIS — G3184 Mild cognitive impairment, so stated: Secondary | ICD-10-CM | POA: Diagnosis not present

## 2016-11-12 DIAGNOSIS — K08109 Complete loss of teeth, unspecified cause, unspecified class: Secondary | ICD-10-CM | POA: Diagnosis not present

## 2016-11-12 DIAGNOSIS — Z9181 History of falling: Secondary | ICD-10-CM | POA: Diagnosis not present

## 2016-11-12 DIAGNOSIS — I132 Hypertensive heart and chronic kidney disease with heart failure and with stage 5 chronic kidney disease, or end stage renal disease: Secondary | ICD-10-CM | POA: Diagnosis not present

## 2016-11-12 DIAGNOSIS — I5032 Chronic diastolic (congestive) heart failure: Secondary | ICD-10-CM | POA: Diagnosis not present

## 2016-11-12 DIAGNOSIS — I739 Peripheral vascular disease, unspecified: Secondary | ICD-10-CM | POA: Diagnosis not present

## 2016-11-12 DIAGNOSIS — Z8679 Personal history of other diseases of the circulatory system: Secondary | ICD-10-CM | POA: Diagnosis not present

## 2016-11-12 DIAGNOSIS — Z79899 Other long term (current) drug therapy: Secondary | ICD-10-CM | POA: Diagnosis not present

## 2016-11-12 DIAGNOSIS — Z6823 Body mass index (BMI) 23.0-23.9, adult: Secondary | ICD-10-CM | POA: Diagnosis not present

## 2016-11-12 DIAGNOSIS — I471 Supraventricular tachycardia: Secondary | ICD-10-CM | POA: Diagnosis not present

## 2016-11-12 DIAGNOSIS — R110 Nausea: Secondary | ICD-10-CM | POA: Diagnosis not present

## 2016-11-12 DIAGNOSIS — Z972 Presence of dental prosthetic device (complete) (partial): Secondary | ICD-10-CM | POA: Diagnosis not present

## 2016-11-12 DIAGNOSIS — I209 Angina pectoris, unspecified: Secondary | ICD-10-CM | POA: Diagnosis not present

## 2016-11-12 DIAGNOSIS — Z8674 Personal history of sudden cardiac arrest: Secondary | ICD-10-CM | POA: Diagnosis not present

## 2016-11-13 DIAGNOSIS — I214 Non-ST elevation (NSTEMI) myocardial infarction: Secondary | ICD-10-CM | POA: Diagnosis not present

## 2016-11-13 DIAGNOSIS — I110 Hypertensive heart disease with heart failure: Secondary | ICD-10-CM | POA: Diagnosis not present

## 2016-11-13 DIAGNOSIS — I4891 Unspecified atrial fibrillation: Secondary | ICD-10-CM | POA: Diagnosis not present

## 2016-11-13 DIAGNOSIS — Z992 Dependence on renal dialysis: Secondary | ICD-10-CM | POA: Diagnosis not present

## 2016-11-13 DIAGNOSIS — I639 Cerebral infarction, unspecified: Secondary | ICD-10-CM | POA: Diagnosis not present

## 2016-11-13 DIAGNOSIS — N185 Chronic kidney disease, stage 5: Secondary | ICD-10-CM | POA: Diagnosis not present

## 2016-11-13 DIAGNOSIS — N186 End stage renal disease: Secondary | ICD-10-CM | POA: Diagnosis not present

## 2016-11-13 DIAGNOSIS — I10 Essential (primary) hypertension: Secondary | ICD-10-CM | POA: Diagnosis not present

## 2016-11-14 DIAGNOSIS — Z992 Dependence on renal dialysis: Secondary | ICD-10-CM | POA: Diagnosis not present

## 2016-11-14 DIAGNOSIS — N186 End stage renal disease: Secondary | ICD-10-CM | POA: Diagnosis not present

## 2016-11-15 ENCOUNTER — Encounter (INDEPENDENT_AMBULATORY_CARE_PROVIDER_SITE_OTHER): Payer: Self-pay | Admitting: Vascular Surgery

## 2016-11-15 ENCOUNTER — Encounter (INDEPENDENT_AMBULATORY_CARE_PROVIDER_SITE_OTHER): Payer: Self-pay

## 2016-11-15 ENCOUNTER — Ambulatory Visit (INDEPENDENT_AMBULATORY_CARE_PROVIDER_SITE_OTHER): Payer: Medicare HMO | Admitting: Vascular Surgery

## 2016-11-15 VITALS — BP 184/78 | HR 68 | Resp 16 | Ht 62.0 in | Wt 124.0 lb

## 2016-11-15 DIAGNOSIS — N186 End stage renal disease: Secondary | ICD-10-CM | POA: Diagnosis not present

## 2016-11-15 DIAGNOSIS — Z992 Dependence on renal dialysis: Secondary | ICD-10-CM | POA: Diagnosis not present

## 2016-11-15 DIAGNOSIS — T829XXD Unspecified complication of cardiac and vascular prosthetic device, implant and graft, subsequent encounter: Secondary | ICD-10-CM | POA: Diagnosis not present

## 2016-11-15 NOTE — Progress Notes (Signed)
Subjective:    Patient ID: Shawna [name], female    DOB: 04-17-1941, 76 y.o.   MRN: [id number] Chief Complaint  Patient presents with  . Re-evaluation    Discuss Herograft   Presents at request of dialysis center. Patient seen on 10/06/16 during which time I had an extensive conversation in regard to need for venogram to assess anatomy for creation of a new access. Patient is currently being maintained by a right permcath. The patient and husband were in agreement however did not move forward with procedure. Patient also seeing Dr. Oneida Alar from [place]. Patient and husband upset that venogram is not happening today. They feel we are "stealing" their money. They live in [place]. We had a long discussion again about need for venogram before placing an access in the OR. Asking for access to be placed today. Permcath running without issue. Denies any fever, nausea or vomiting.    Review of Systems  Constitutional: Negative.   HENT: Negative.   Eyes: Negative.   Respiratory: Negative.   Cardiovascular: Negative.   Gastrointestinal: Negative.   Endocrine: Negative.   Genitourinary:       ESRD  Musculoskeletal: Negative.   Skin: Negative.   Allergic/Immunologic: Negative.   Neurological: Negative.   Hematological: Negative.   Psychiatric/Behavioral: Negative.       Objective:   Physical Exam  Constitutional: She is oriented to person, place, and time. She appears well-developed and well-nourished. No distress.  HENT:  Head: Normocephalic and atraumatic.  Eyes: Conjunctivae are normal. Pupils are equal, round, and reactive to light.  Neck: Normal range of motion.  Right IJ Permcath intact, non-infected.  Cardiovascular: Normal rate and normal heart sounds.   Pulses:      Radial pulses are 2+ on the right side, and 2+ on the left side.  Pulmonary/Chest: Effort normal.  Musculoskeletal: Normal range of motion. She exhibits no edema.  Neurological: She is alert and oriented to  person, place, and time.  Skin: Skin is warm and dry. She is not diaphoretic.  Psychiatric: She has a normal mood and affect. Her behavior is normal. Judgment and thought content normal.  Vitals reviewed.  BP (!) 184/78 (BP Location: Right Arm)   Pulse 68   Resp 16   Ht 5\' 2"  (1.575 m)   Wt 124 lb (56.2 kg)   BMI 22.68 kg/m   Past Medical History:  Diagnosis Date  . Acute on chronic respiratory failure with hypoxia ([place]) 10/10/2016  . Anxiety   . Arthritis   . AVM (arteriovenous malformation) of colon   . CAD (coronary artery disease)    a. 12/2011 NSTEMI/Cath/PCI LCX (2.25x14 Resolute DES) & D1 (2.25x22 Resolute DES);  b. 01/2012 Cath/PCI: LM 30, LAD 30p, 40-17m, D1 stent ok, 99 in sm branch of diag, LCX patent stent, OM1 20, RCA 95 ost (4.0x12 Promus DES), EF 55%;  c. 04/2012 Lexi Cardiolite  EF 48%, small area of scar @ base/mid inflat wall with mild peri-infarct ischemia.; CABG 12/4  . Carotid artery disease ([place])    a. 39-03% LICA, 0/0923   . Chronic bronchitis ([place])   . Chronic diastolic CHF (congestive heart failure) ([place])    a. 02/2012 Echo EF 60-65%, nl wall motion, Gr 1 DD, mod MR  . Colon cancer ([place]) 1992  . Esophageal stricture   . ESRD on hemodialysis ([place])    ESRD due to HTN, started dialysis 2011 and gets HD at [facility] with Dr Hinda Lenis on MWF schedule.  Access is LUA AVF as of Sept 2014.   Marland Kitchen GERD (gastroesophageal reflux disease)   . High cholesterol 12/2011  . History of blood transfusion 07/2011; 12/2011; 01/2012 X 2; 04/2012  . History of gout   . History of lower GI bleeding   . Hypertension   . Iron deficiency anemia   . Mitral regurgitation    a. Moderate by echo, 02/2012  . Myocardial infarction ([place])   . Ovarian cancer ([place]) 1992  . Pneumonia ~ 2009  . PUD (peptic ulcer disease)   . TIA (transient ischemic attack)    Social History   Social History  . Marital status: Married    Spouse name: N/A  . Number of children: N/A  . Years of education:  N/A   Occupational History  . Not on file.   Social History Main Topics  . Smoking status: Never Smoker  . Smokeless tobacco: Never Used  . Alcohol use No  . Drug use: No  . Sexual activity: Yes    Birth control/ protection: Surgical   Other Topics Concern  . Not on file   Social History Narrative   Lives in [place], [place] with husband.  Dialysis pt - [facility].   Past Surgical History:  Procedure Laterality Date  . ABDOMINAL HYSTERECTOMY  1992  . APPENDECTOMY  06/1990  . AV FISTULA PLACEMENT  07/2009   left upper arm  . AV FISTULA PLACEMENT Right 09/06/2016   Procedure: RIGHT FOREARM ARTERIOVENOUS (AV) GRAFT;  Surgeon: Elam Dutch, MD;  Location: [place];  Service: Vascular;  Laterality: Right;  . [place] REMOVAL Right 09/06/2016   Procedure: REMOVAL OF Right Arm ARTERIOVENOUS GORETEX GRAFT and Vein Patch angioplasty of brachial artery;  Surgeon: Angelia Mould, MD;  Location: [facility];  Service: Vascular;  Laterality: Right;  . COLON RESECTION  1992  . CORONARY ANGIOPLASTY WITH STENT PLACEMENT  12/15/11   "2"  . CORONARY ANGIOPLASTY WITH STENT PLACEMENT  y/2013   "1; makes total of 3" (05/02/2012)  . CORONARY ARTERY BYPASS GRAFT  06/13/2012   Procedure: CORONARY ARTERY BYPASS GRAFTING (CABG);  Surgeon: Grace Isaac, MD;  Location: [place];  Service: Open Heart Surgery;  Laterality: N/A;  cabg x four;  using left internal mammary artery, and left leg greater saphenous vein harvested endoscopically  . CORONARY STENT INTERVENTION N/A 10/13/2016   Procedure: Coronary Stent Intervention;  Surgeon: Troy Sine, MD;  Location: [place] CV LAB;  Service: Cardiovascular;  Laterality: N/A;  . DILATION AND CURETTAGE OF UTERUS    . ESOPHAGOGASTRODUODENOSCOPY  01/20/2012   Procedure: ESOPHAGOGASTRODUODENOSCOPY (EGD);  Surgeon: Ladene Artist, MD,FACG;  Location: [facility] ENDOSCOPY;  Service: Endoscopy;  Laterality: N/A;  . ESOPHAGOGASTRODUODENOSCOPY N/A 03/26/2013   Procedure:  ESOPHAGOGASTRODUODENOSCOPY (EGD);  Surgeon: Irene Shipper, MD;  Location: [facility] ENDOSCOPY;  Service: Endoscopy;  Laterality: N/A;  . ESOPHAGOGASTRODUODENOSCOPY N/A 04/30/2015   Procedure: ESOPHAGOGASTRODUODENOSCOPY (EGD);  Surgeon: Rogene Houston, MD;  [facility];  Service: Endoscopy;  Laterality: N/A;  1pm - moved to 10/20 @ 1:10  . ESOPHAGOGASTRODUODENOSCOPY N/A 07/29/2016   Procedure: ESOPHAGOGASTRODUODENOSCOPY (EGD);  Surgeon: Manus Gunning, MD;  Location: [place];  Service: Gastroenterology;  Laterality: N/A;  enteroscopy  . INTRAOPERATIVE TRANSESOPHAGEAL ECHOCARDIOGRAM  06/13/2012   Procedure: INTRAOPERATIVE TRANSESOPHAGEAL ECHOCARDIOGRAM;  Surgeon: Grace Isaac, MD;  Location: [place];  Service: Open Heart Surgery;  Laterality: N/A;  . IR GENERIC HISTORICAL  07/26/2016   IR FLUORO GUIDE CV LINE RIGHT  07/26/2016 Greggory Keen, MD [facility]-INTER[facility]  . IR GENERIC HISTORICAL  07/26/2016   IR US GUIDE VASC ACCESS RIGHT 07/26/2016 Greggory Keen, MD [facility]  . IR GENERIC HISTORICAL  08/02/2016   IR US GUIDE VASC ACCESS RIGHT 08/02/2016 Greggory Keen, MD [facility]  . IR GENERIC HISTORICAL  08/02/2016   IR FLUORO GUIDE CV LINE RIGHT 08/02/2016 Greggory Keen, MD [facility]  . LEFT HEART CATH AND CORONARY ANGIOGRAPHY N/A 09/20/2016   Procedure: Left Heart Cath and Coronary Angiography;  Surgeon: Shawna Crome, MD;  Location: [place] CV LAB;  Service: Cardiovascular;  Laterality: N/A;  . LEFT HEART CATH AND CORS/GRAFTS ANGIOGRAPHY N/A 10/13/2016   Procedure: Left Heart Cath and Cors/Grafts Angiography;  Surgeon: Troy Sine, MD;  Location: [place] CV LAB;  Service: Cardiovascular;  Laterality: N/A;  . LEFT HEART CATHETERIZATION WITH CORONARY ANGIOGRAM N/A 12/15/2011   Procedure: LEFT HEART CATHETERIZATION WITH CORONARY ANGIOGRAM;  Surgeon: Burnell Blanks, MD;  Location: [facility] CATH LAB;  Service: Cardiovascular;  Laterality: N/A;  . LEFT HEART CATHETERIZATION WITH  CORONARY ANGIOGRAM N/A 01/10/2012   Procedure: LEFT HEART CATHETERIZATION WITH CORONARY ANGIOGRAM;  Surgeon: Peter M Martinique, MD;  Location: [facility] CATH LAB;  Service: Cardiovascular;  Laterality: N/A;  . LEFT HEART CATHETERIZATION WITH CORONARY ANGIOGRAM N/A 06/08/2012   Procedure: LEFT HEART CATHETERIZATION WITH CORONARY ANGIOGRAM;  Surgeon: Burnell Blanks, MD;  Location[facility] CATH LAB;  Service: Cardiovascular;  Laterality: N/A;  . LEFT HEART CATHETERIZATION WITH CORONARY/GRAFT ANGIOGRAM N/A 12/10/2013   Procedure: LEFT HEART CATHETERIZATION WITH Beatrix Fetters;  Surgeon: [name], MD;  Location: [facility] CATH LAB;  Service: Cardiovascular;  Laterality: N/A;  . OVARY SURGERY     ovarian cancer  . SHUNTOGRAM N/A 10/15/2013   Procedure: Fistulogram;  Surgeon: Serafina Mitchell, MD;  Location: [facility] CATH LAB;  Service: Cardiovascular;  Laterality: N/A;  . THROMBECTOMY / ARTERIOVENOUS GRAFT REVISION  2011   left upper arm  . TUBAL LIGATION  1980's   Family History  Problem Relation Age of Onset  . Heart disease Mother     Heart Disease before age 17  . Hyperlipidemia Mother   . Hypertension Mother   . Diabetes Mother   . Heart attack Mother   . Heart disease Father     Heart Disease before age 46  . Hyperlipidemia Father   . Hypertension Father   . Diabetes Father   . Diabetes Sister   . Hypertension Sister   . Diabetes Brother   . Hyperlipidemia Brother   . Heart attack Brother   . Hypertension Sister   . Heart attack Brother   . Other      noncontributory for early CAD  . Colon cancer Neg Hx   . Esophageal cancer Neg Hx   . Liver disease Neg Hx   . Kidney disease Neg Hx   . Colon polyps Neg Hx    Allergies  Allergen Reactions  . Aspirin Other (See Comments)    High Doses Mess up her stomach; "makes my bowels have blood in them". Takes 81 mg EC Aspirin   . Penicillins Other (See Comments)    SYNCOPE? , "makes me real weak when I take it; like I'll pass out"  Has  patient had a PCN reaction causing immediate rash, facial/tongue/throat swelling, SOB or lightheadedness with hypotension: no Has patient had a PCN reaction causing severe rash involving mucus membranes or skin necrosis: no Has patient had a PCN reaction  that required hospitalization no Has patient had a PCN reaction occurring within the last 10 years: no If all of the above  . Bactrim [Sulfamethoxazole-Trimethoprim] Rash  . Contrast Media [Iodinated Diagnostic Agents] Itching  . Iron Itching and Other (See Comments)    "they gave me iron in dialysis; had to give me Benadryl cause I had to have the iron" (05/02/2012)  . Nitrofurantoin Hives  . Prilosec [Omeprazole] Other (See Comments)    "back spasms"  . Tylenol [Acetaminophen] Itching and Other (See Comments)    Makes her feet on fire per pt  . Dexilant [Dexlansoprazole] Other (See Comments)    Upset stomach  . Levaquin [Levofloxacin In D5w] Rash  . Morphine And Related Itching    Itching in feet  . Plavix [Clopidogrel Bisulfate] Rash  . Protonix [Pantoprazole Sodium] Rash  . Venofer [Ferric Oxide] Itching    Patient reports using Benadryl prior to doses as Mitchell:  Presents at request of dialysis center. Patient seen on 10/06/16 during which time I had an extensive conversation in regard to need for venogram to assess anatomy for creation of a new access. Patient is currently being maintained by a right permcath. The patient and husband were in agreement however did not move forward with procedure. Patient also seeing Dr. Oneida Alar from [place]. Patient and husband upset that venogram is not happening today. They feel we are "stealing" their money. They live in [place]. We had a long discussion again about need for venogram before placing an access in the OR. Asking for access to be placed today. Permcath running without issue. Denies any fever, nausea or vomiting.   1. ESRD on hemodialysis ([place]) -  Stable Patient is in need of permanent access. Has had multiple previous access placed with steal syndrome. Being maintained by permcath. Will plan for venogram to assess anatomy for planned placement of new access. Patient and husband in agreement. Procedure, risks benefits explained to patient. All questions answered. Patient wishes to proceed.   2. Complication from renal dialysis device, subsequent encounter - Stable As above.  Current Outpatient Prescriptions on File Prior to Visit  Medication Sig Dispense Refill  . ALPRAZolam (XANAX) 0.25 MG tablet Take 0.25 mg by mouth 2 (two) times daily as needed for anxiety.     Marland Kitchen amLODipine (NORVASC) 2.5 MG tablet Take 2.5 mg by mouth daily.    Marland Kitchen aspirin EC 81 MG tablet Take 81 mg by mouth daily.     Marland Kitchen atorvastatin (LIPITOR) 40 MG tablet Take 1 tablet (40 mg total) by mouth daily at 6 PM. (Patient taking differently: Take 40 mg by mouth at bedtime. ) 30 tablet 6  . carvedilol (COREG) 6.25 MG tablet Take 1 tablet (6.25 mg total) by mouth 2 (two) times daily with a meal. 60 tablet 0  . cinacalcet (SENSIPAR) 30 MG tablet Take 30 mg by mouth daily after supper.    Marland Kitchen epoetin alfa (EPOGEN,PROCRIT) 38250 UNIT/ML injection Inject 1 mL (10,000 Units total) into the vein every Monday, Wednesday, and Friday with hemodialysis. 1 mL 10  . fluticasone (FLONASE) 50 MCG/ACT nasal spray Place 2 sprays into the nose daily as needed for allergies.     . folic acid-vitamin b complex-vitamin c-selenium-zinc (DIALYVITE) 3 MG TABS tablet Take 1 tablet by mouth daily.    . hydrALAZINE (APRESOLINE) 50 MG tablet Take 2 tablets (100 mg total) by mouth 3 (three) times daily. 540 tablet 3  .  isosorbide mononitrate (IMDUR) 120 MG 24 hr tablet Take 1 tablet (120 mg total) by mouth daily. 30 tablet 2  . loratadine (CLARITIN) 10 MG tablet Take 10 mg by mouth daily as needed for allergies.    . nitroGLYCERIN (NITROSTAT) 0.4 MG SL tablet Place 1 tablet (0.4 mg total) under the  tongue every 5 (five) minutes as needed for chest pain. 30 tablet 1  . omeprazole (PRILOSEC) 20 MG capsule Take 20 mg by mouth daily as needed (acid reflux/heartburn).     . ticagrelor (BRILINTA) 90 MG TABS tablet Take 1 tablet (90 mg total) by mouth 2 (two) times daily. 180 tablet 3   No current facility-administered medications on file prior to visit.     There are no Patient Instructions on file for this visit. No Follow-up on file.   Aiana Nordquist A Clarrisa Kaylor, PA-C

## 2016-11-16 ENCOUNTER — Other Ambulatory Visit: Payer: Self-pay | Admitting: *Deleted

## 2016-11-16 DIAGNOSIS — Z992 Dependence on renal dialysis: Secondary | ICD-10-CM | POA: Diagnosis not present

## 2016-11-16 DIAGNOSIS — N186 End stage renal disease: Secondary | ICD-10-CM | POA: Diagnosis not present

## 2016-11-16 MED ORDER — CARVEDILOL 6.25 MG PO TABS: 6.2500 mg | ORAL_TABLET | Freq: Two times a day (BID) | ORAL | 1 refills | Status: DC

## 2016-11-17 ENCOUNTER — Ambulatory Visit: Payer: Medicare HMO | Admitting: Vascular Surgery

## 2016-11-17 ENCOUNTER — Encounter (INDEPENDENT_AMBULATORY_CARE_PROVIDER_SITE_OTHER): Payer: Self-pay | Admitting: *Deleted

## 2016-11-17 ENCOUNTER — Ambulatory Visit (INDEPENDENT_AMBULATORY_CARE_PROVIDER_SITE_OTHER): Payer: Medicare HMO | Admitting: Internal Medicine

## 2016-11-17 ENCOUNTER — Encounter (HOSPITAL_COMMUNITY): Payer: Self-pay

## 2016-11-17 DIAGNOSIS — Z951 Presence of aortocoronary bypass graft: Secondary | ICD-10-CM | POA: Insufficient documentation

## 2016-11-17 DIAGNOSIS — Z7982 Long term (current) use of aspirin: Secondary | ICD-10-CM | POA: Diagnosis not present

## 2016-11-17 DIAGNOSIS — I2510 Atherosclerotic heart disease of native coronary artery without angina pectoris: Secondary | ICD-10-CM | POA: Diagnosis not present

## 2016-11-17 DIAGNOSIS — I132 Hypertensive heart and chronic kidney disease with heart failure and with stage 5 chronic kidney disease, or end stage renal disease: Secondary | ICD-10-CM | POA: Insufficient documentation

## 2016-11-17 DIAGNOSIS — I252 Old myocardial infarction: Secondary | ICD-10-CM | POA: Diagnosis not present

## 2016-11-17 DIAGNOSIS — Z8673 Personal history of transient ischemic attack (TIA), and cerebral infarction without residual deficits: Secondary | ICD-10-CM | POA: Insufficient documentation

## 2016-11-17 DIAGNOSIS — Z955 Presence of coronary angioplasty implant and graft: Secondary | ICD-10-CM | POA: Insufficient documentation

## 2016-11-17 DIAGNOSIS — R05 Cough: Secondary | ICD-10-CM | POA: Insufficient documentation

## 2016-11-17 DIAGNOSIS — I5032 Chronic diastolic (congestive) heart failure: Secondary | ICD-10-CM | POA: Insufficient documentation

## 2016-11-17 DIAGNOSIS — R1084 Generalized abdominal pain: Secondary | ICD-10-CM | POA: Diagnosis present

## 2016-11-17 DIAGNOSIS — Z79899 Other long term (current) drug therapy: Secondary | ICD-10-CM | POA: Insufficient documentation

## 2016-11-17 DIAGNOSIS — Z85038 Personal history of other malignant neoplasm of large intestine: Secondary | ICD-10-CM | POA: Diagnosis not present

## 2016-11-17 DIAGNOSIS — Z992 Dependence on renal dialysis: Secondary | ICD-10-CM | POA: Diagnosis not present

## 2016-11-17 DIAGNOSIS — N186 End stage renal disease: Secondary | ICD-10-CM | POA: Diagnosis not present

## 2016-11-17 DIAGNOSIS — R1030 Lower abdominal pain, unspecified: Secondary | ICD-10-CM | POA: Diagnosis not present

## 2016-11-17 DIAGNOSIS — N2889 Other specified disorders of kidney and ureter: Secondary | ICD-10-CM | POA: Insufficient documentation

## 2016-11-17 LAB — COMPREHENSIVE METABOLIC PANEL
ALT: 18 U/L (ref 14–54)
AST: 27 U/L (ref 15–41)
Albumin: 3 g/dL — ABNORMAL LOW (ref 3.5–5.0)
Alkaline Phosphatase: 53 U/L (ref 38–126)
Anion gap: 13 (ref 5–15)
BILIRUBIN TOTAL: 0.2 mg/dL — AB (ref 0.3–1.2)
BUN: 32 mg/dL — AB (ref 6–20)
CO2: 27 mmol/L (ref 22–32)
Calcium: 9.5 mg/dL (ref 8.9–10.3)
Chloride: 92 mmol/L — ABNORMAL LOW (ref 101–111)
Creatinine, Ser: 7.89 mg/dL — ABNORMAL HIGH (ref 0.44–1.00)
GFR, EST AFRICAN AMERICAN: 5 mL/min — AB (ref >60)
GFR, EST NON AFRICAN AMERICAN: 4 mL/min — AB (ref >60)
Glucose, Bld: 124 mg/dL — ABNORMAL HIGH (ref 65–99)
POTASSIUM: 3.8 mmol/L (ref 3.5–5.1)
Sodium: 132 mmol/L — ABNORMAL LOW (ref 135–145)
TOTAL PROTEIN: 5.8 g/dL — AB (ref 6.5–8.1)

## 2016-11-17 LAB — LIPASE, BLOOD: Lipase: 18 U/L (ref 11–51)

## 2016-11-17 LAB — CBC
HEMATOCRIT: 33 % — AB (ref 36.0–46.0)
Hemoglobin: 10.6 g/dL — ABNORMAL LOW (ref 12.0–15.0)
MCH: 30.9 pg (ref 26.0–34.0)
MCHC: 32.1 g/dL (ref 30.0–36.0)
MCV: 96.2 fL (ref 78.0–100.0)
Platelets: 282 10*3/uL (ref 150–400)
RBC: 3.43 MIL/uL — ABNORMAL LOW (ref 3.87–5.11)
RDW: 18.2 % — ABNORMAL HIGH (ref 11.5–15.5)
WBC: 8.3 10*3/uL (ref 4.0–10.5)

## 2016-11-17 NOTE — ED Triage Notes (Signed)
PT states she has had n/v/d on and off since Monday and several episodes of uncontrollable diarrhea. PT last went to dialysis yesterday. n/v/ today but she has been taking imodium and zofran

## 2016-11-18 ENCOUNTER — Emergency Department (HOSPITAL_COMMUNITY)
Admission: EM | Admit: 2016-11-18 | Discharge: 2016-11-18 | Disposition: A | Discharge status: Home / Self Care | Payer: Medicare HMO | Attending: Emergency Medicine | Admitting: Emergency Medicine

## 2016-11-18 ENCOUNTER — Emergency Department (HOSPITAL_COMMUNITY): Payer: Medicare HMO

## 2016-11-18 ENCOUNTER — Encounter (INDEPENDENT_AMBULATORY_CARE_PROVIDER_SITE_OTHER): Payer: Self-pay

## 2016-11-18 DIAGNOSIS — R05 Cough: Secondary | ICD-10-CM

## 2016-11-18 DIAGNOSIS — Z85038 Personal history of other malignant neoplasm of large intestine: Secondary | ICD-10-CM | POA: Diagnosis not present

## 2016-11-18 DIAGNOSIS — I2510 Atherosclerotic heart disease of native coronary artery without angina pectoris: Secondary | ICD-10-CM | POA: Diagnosis not present

## 2016-11-18 DIAGNOSIS — I252 Old myocardial infarction: Secondary | ICD-10-CM | POA: Diagnosis not present

## 2016-11-18 DIAGNOSIS — N2889 Other specified disorders of kidney and ureter: Secondary | ICD-10-CM

## 2016-11-18 DIAGNOSIS — N186 End stage renal disease: Secondary | ICD-10-CM | POA: Diagnosis not present

## 2016-11-18 DIAGNOSIS — Z8673 Personal history of transient ischemic attack (TIA), and cerebral infarction without residual deficits: Secondary | ICD-10-CM | POA: Diagnosis not present

## 2016-11-18 DIAGNOSIS — I132 Hypertensive heart and chronic kidney disease with heart failure and with stage 5 chronic kidney disease, or end stage renal disease: Secondary | ICD-10-CM | POA: Diagnosis not present

## 2016-11-18 DIAGNOSIS — Z992 Dependence on renal dialysis: Secondary | ICD-10-CM | POA: Diagnosis not present

## 2016-11-18 DIAGNOSIS — R059 Cough, unspecified: Secondary | ICD-10-CM

## 2016-11-18 DIAGNOSIS — R1030 Lower abdominal pain, unspecified: Secondary | ICD-10-CM | POA: Diagnosis not present

## 2016-11-18 DIAGNOSIS — I5032 Chronic diastolic (congestive) heart failure: Secondary | ICD-10-CM | POA: Diagnosis not present

## 2016-11-18 IMAGING — CT CT ABD-PELV W/O CM
2 of 4 series · 15 of 46 positions shown, 17 images · non-contrast
Comparison: Right upper quadrant ultrasound performed [DATE],
and CT of the abdomen and pelvis performed [DATE]

CLINICAL DATA: Acute onset of lower abdominal pain, distention,
nausea and diarrhea. Initial encounter.

EXAM:
CT ABDOMEN AND PELVIS WITHOUT CONTRAST
TECHNIQUE: Multidetector CT imaging of the abdomen and pelvis was performed
following the standard protocol without IV contrast.

[Series 3: a/p w/o 5mm · axial · non-contrast · 0.80mm/px · z∈[-389,-49]mm · 12 of 79 slices shown, 14 images]
[im 7/79  soft-tissue]
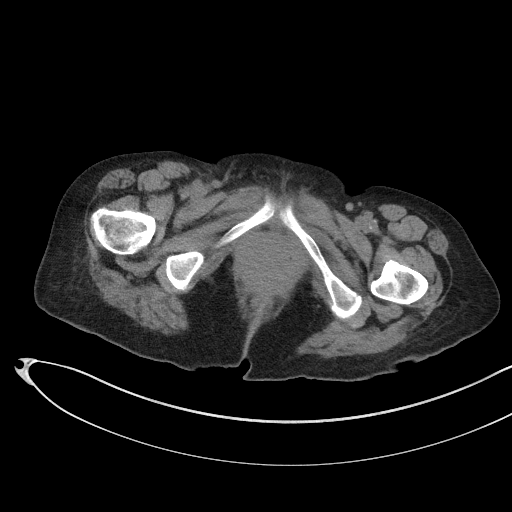
[im 7/79  bone]
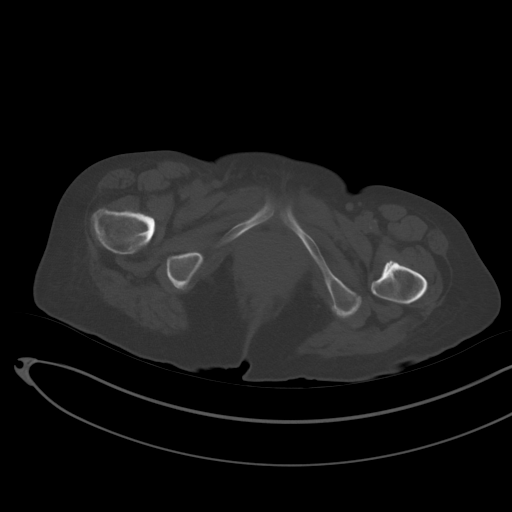
[im 13/79  soft-tissue]
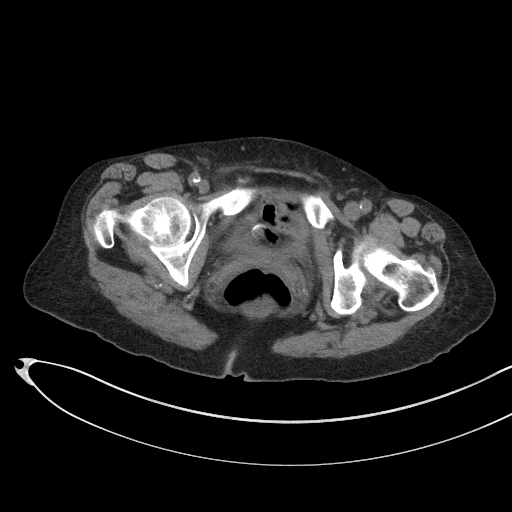
[im 19/79  soft-tissue]
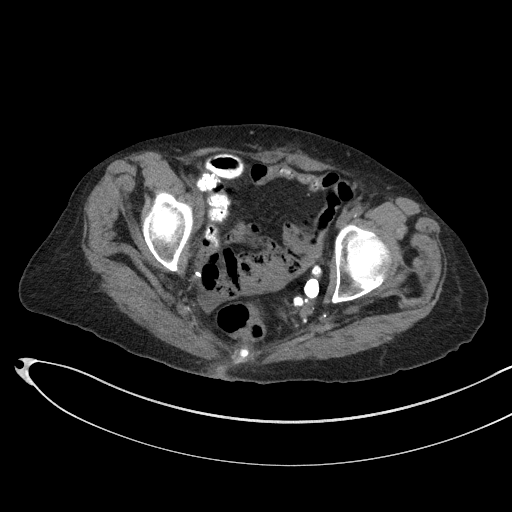
[im 25/79  soft-tissue]
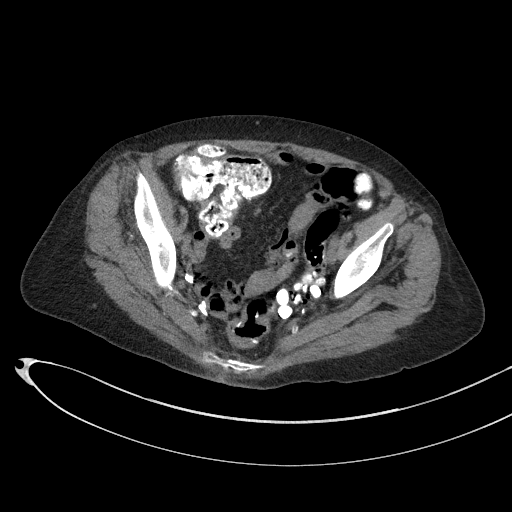
[im 32/79  soft-tissue]
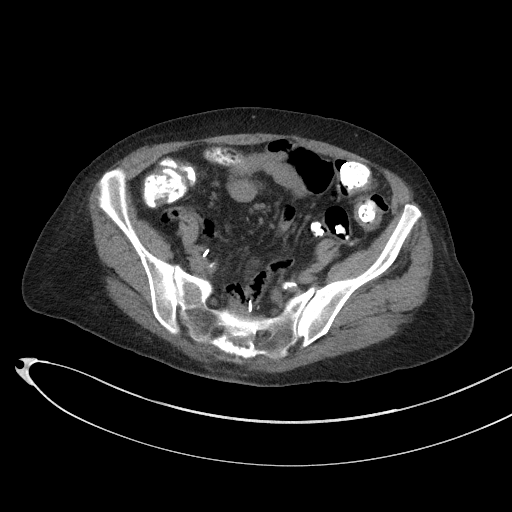
[im 38/79  soft-tissue]
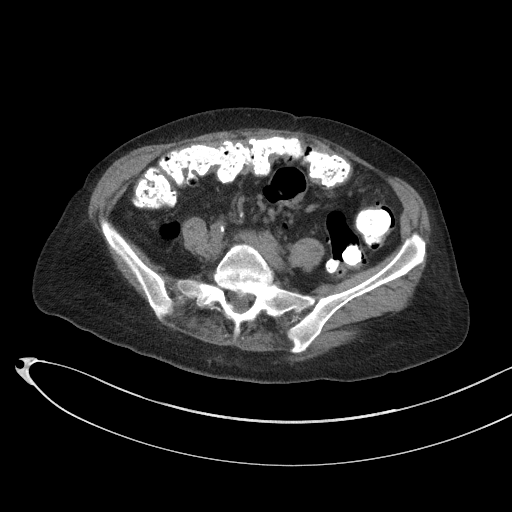
[im 44/79  soft-tissue]
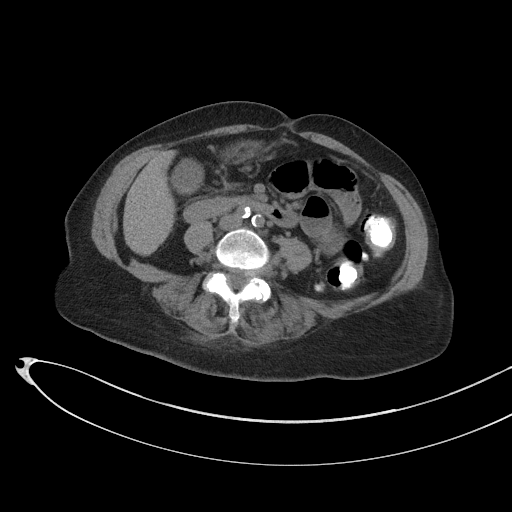
[im 50/79  soft-tissue]
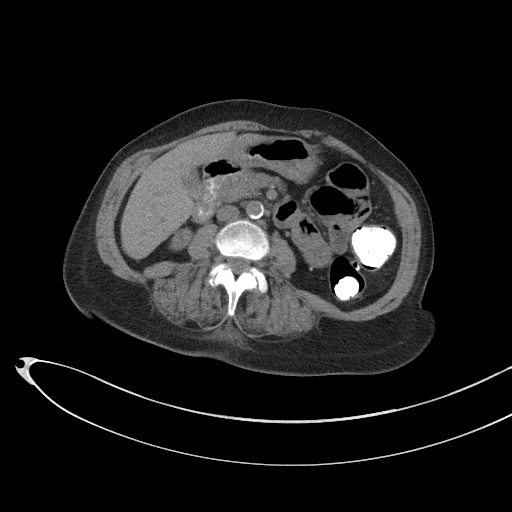
[im 57/79  soft-tissue]
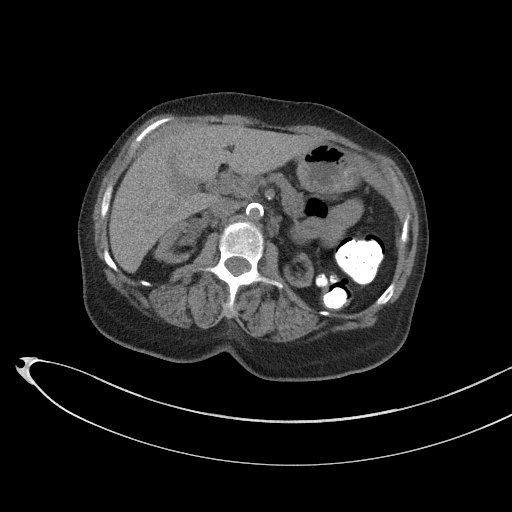
[im 57/79  bone]
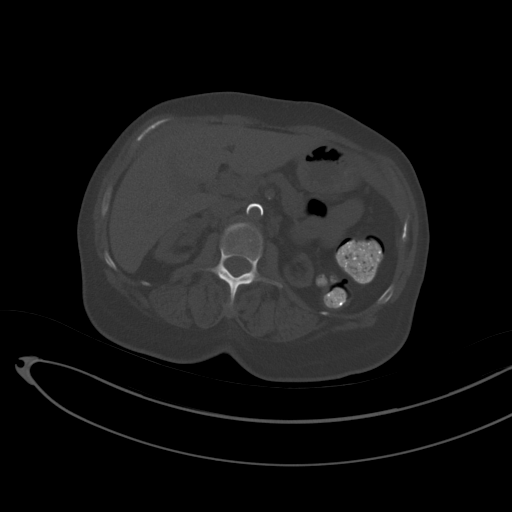
[im 63/79  soft-tissue]
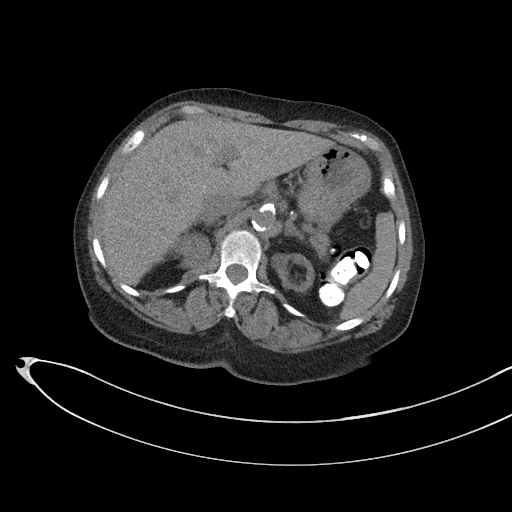
[im 69/79  soft-tissue]
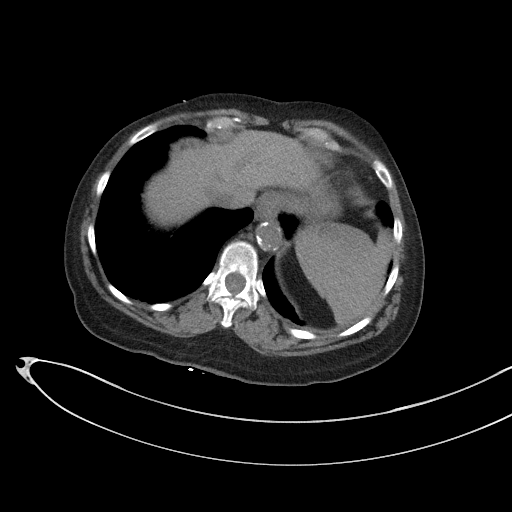
[im 75/79  soft-tissue]
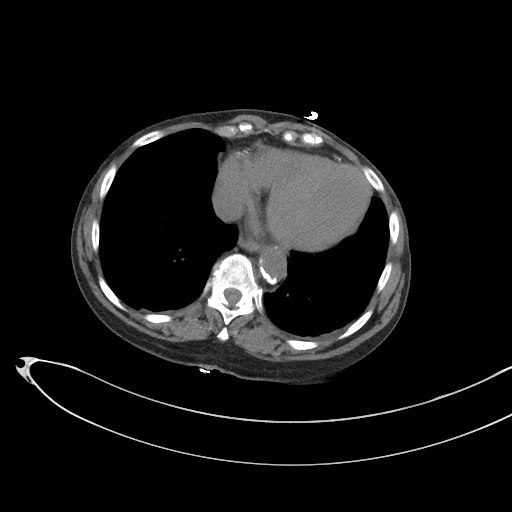

[Series 6: a/p w/o cor · coronal · non-contrast · 0.77mm/px · 3 of 130 slices shown]
[im 44/130  soft-tissue]
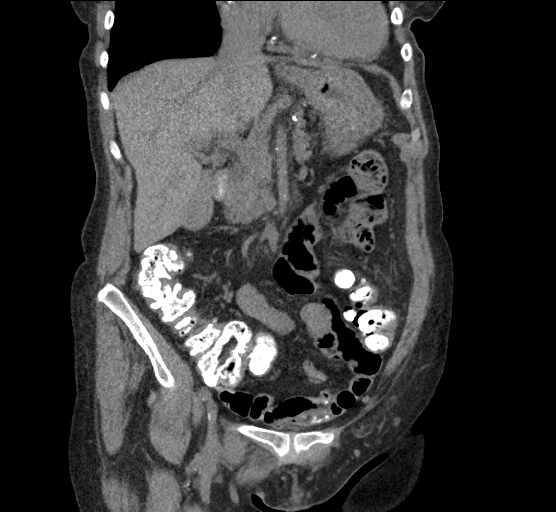
[im 58/130  soft-tissue]
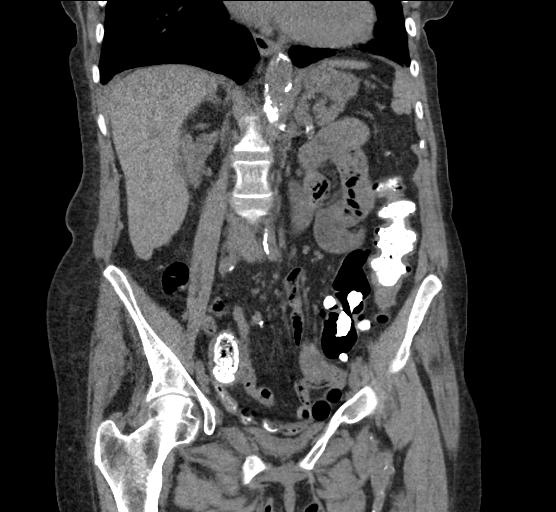
[im 72/130  soft-tissue]
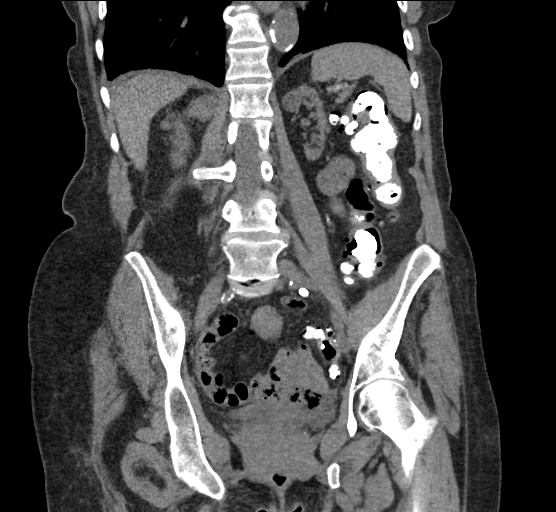

[15 of 46 positions shown; findings below may reference images not displayed]

FINDINGS: Lower chest: Diffuse coronary artery calcifications are seen. Mild
left basilar airspace opacity may reflect atelectasis or possibly
mild infection.

Hepatobiliary: The liver is unremarkable in appearance. Minimally
increased attenuation within the gallbladder may reflect stones. The
common bile duct remains normal in caliber.

Pancreas: The pancreas is within normal limits.

Spleen: The spleen is unremarkable in appearance.

Adrenals/Urinary Tract: The adrenal glands are unremarkable in
appearance.

A heterogeneous mass arising at the upper pole of the right kidney
measures 2.9 cm. This has increased gradually from 2.2 cm in [FZ],
and may reflect an indolent malignancy. A 1.6 cm nodule at the
medial aspect of the left kidney is new from [FZ], and increased
from 1.0 cm in [FZ].

There is severe chronic bilateral renal atrophy, with minimal
parenchymal calcification on the left, and mild nonspecific
perinephric stranding bilaterally. There is no evidence of
hydronephrosis. No renal or ureteral stones are identified.

Stomach/Bowel: The stomach is unremarkable in appearance. The small
bowel is within normal limits. The patient is status post
appendectomy.

Scattered diverticulosis is noted along the entirety of the
visualized colon. A bowel suture line is noted at the distal sigmoid
colon.

Vascular/Lymphatic: Diffuse calcification is seen along the
abdominal aorta and its branches. The abdominal aorta is otherwise
grossly unremarkable. The inferior vena cava is grossly
unremarkable. No retroperitoneal lymphadenopathy is seen. No pelvic
sidewall lymphadenopathy is identified.

Reproductive: The bladder is decompressed and not well assessed. The
patient is status post hysterectomy. No suspicious adnexal masses
are seen.

Other: No additional soft tissue abnormalities are seen.

Musculoskeletal: No acute osseous abnormalities are identified. The
visualized musculature is unremarkable in appearance.
IMPRESSION: 1. Mild left basilar airspace opacity may reflect atelectasis or
possibly mild infection.
2. Heterogeneous mass arising at the upper pole of the right kidney
measures 2.9 cm. This has gradually increased in size from 2.2 cm in
[FZ]. This may reflect indolent malignancy. MRI of the abdomen
without contrast is recommended for further evaluation, as deemed
clinically appropriate.
3. 1.6 cm nodule with the medial aspect of the left kidney is new
from [FZ], and increased from 1.0 cm in [FZ]. This could also
reflect malignancy.
4. Severe chronic bilateral renal atrophy.
5. Scattered diverticulosis along the entirety of the visualized
colon.
6. Diffuse aortic atherosclerosis.
7. Question of small gallstones. Gallbladder otherwise unremarkable.
8. Diffuse coronary artery calcifications seen.

## 2016-11-18 MED ORDER — IOPAMIDOL (ISOVUE-300) INJECTION 61%
INTRAVENOUS | Status: AC
  Filled 2016-11-18: qty 30

## 2016-11-18 MED ORDER — BENZONATATE 100 MG PO CAPS: 100.0000 mg | ORAL_CAPSULE | Freq: Three times a day (TID) | ORAL | 0 refills | Status: DC

## 2016-11-18 NOTE — ED Provider Notes (Signed)
[facility] DEPT Provider Note   CSN: [id number] Arrival date & time: 11/17/16  2149  By signing my name below, I, Dolores Hoose, attest that this documentation has been prepared under the direction and in the presence of Varney Biles, MD . Electronically Signed: Dolores Hoose, Scribe. 11/18/2016. 1:55 AM.  History   Chief Complaint Chief Complaint  Patient presents with  . Abdominal Pain  . Emesis   The history is provided by the patient. No language interpreter was used.    HPI Comments:  Shawna [name] is a 76 y.o. female with pmhx of CAD, AVM of her colon, GERD, ESRD, HTN and TIA on a MWF dialysis schedule who presents to the Emergency Department complaining of intermittent, mild, abdominal pain onset 5 days. She describes her pain as sharp, localized in both upper and lower portions of her abdomen, and alleviated when she uses the restroom. Pt reports associated nausea, vomiting, distention and "non-stop" diarrhea. She has tried nausea pills with no relief and immodium which caused her to become constipated. P/o intake intact. She has a pshx of hernia repair. No sick contacts indicated.   Past Medical History:  Diagnosis Date  . Acute on chronic respiratory failure with hypoxia ([place]) 10/10/2016  . Anxiety   . Arthritis   . AVM (arteriovenous malformation) of colon   . CAD (coronary artery disease)    a. 12/2011 NSTEMI/Cath/PCI LCX (2.25x14 Resolute DES) & D1 (2.25x22 Resolute DES);  b. 01/2012 Cath/PCI: LM 30, LAD 30p, 40-24m, D1 stent ok, 99 in sm branch of diag, LCX patent stent, OM1 20, RCA 95 ost (4.0x12 Promus DES), EF 55%;  c. 04/2012 Lexi Cardiolite  EF 48%, small area of scar @ base/mid inflat wall with mild peri-infarct ischemia.; CABG 12/4  . Carotid artery disease ([place])    a. 82-50% LICA, 11/3974   . Chronic bronchitis ([place])   . Chronic diastolic CHF (congestive heart failure) ([place])    a. 02/2012 Echo EF 60-65%, nl wall motion, Gr 1 DD, mod MR  . Colon cancer ([place])  1992  . Esophageal stricture   . ESRD on hemodialysis ([place])    ESRD due to HTN, started dialysis 2011 and gets HD at [facility] with Dr Hinda Lenis on MWF schedule.  Access is LUA AVF as of Sept 2014.   Marland Kitchen GERD (gastroesophageal reflux disease)   . High cholesterol 12/2011  . History of blood transfusion 07/2011; 12/2011; 01/2012 X 2; 04/2012  . History of gout   . History of lower GI bleeding   . Hypertension   . Iron deficiency anemia   . Mitral regurgitation    a. Moderate by echo, 02/2012  . Myocardial infarction ([place])   . Ovarian cancer ([place]) 1992  . Pneumonia ~ 2009  . PUD (peptic ulcer disease)   . TIA (transient ischemic attack)     Patient Active Problem List   Diagnosis Date Noted  . Symptomatic anemia 10/24/2016  . H/O non-ST elevation myocardial infarction (NSTEMI) 10/24/2016  . Fluid overload 10/10/2016  . Complication from renal dialysis device 10/10/2016  . Non-ST elevation MI (NSTEMI) ([place])   . Encounter for fitting and adjustment of vascular catheter   . End stage renal disease ([place])   . ESRD (end stage renal disease) ([place])   . Heme positive stool   . Demand ischemia ([place]) 07/27/2016  . Hypertensive emergency 07/08/2016  . Acute on chronic respiratory failure with hypoxia ([place])   . Cardiac arrest ([place])   . Palliative  care encounter   . Goals of care, counseling/discussion   . Hypertensive crisis without congestive heart failure 05/09/2016  . Acute pulmonary edema ([place]) 04/06/2016  . Acute respiratory failure ([place]) 04/06/2016  . Hypertensive crisis 01/27/2016  . History of colon cancer 01/27/2016  . History of ovarian cancer 01/27/2016  . Hypertensive urgency 01/27/2016  . Narrow complex tachycardia ([place]) 09/08/2015  . SVT (supraventricular tachycardia) ([place]) 09/08/2015  . Influenza A 08/30/2015  . Acute on chronic diastolic CHF (congestive heart failure) ([place]) 05/04/2015  . Unstable angina ([place]) 05/03/2015  . Essential hypertension   . Pain in joint, lower  leg 08/14/2014  . Chest pain 11/26/2013  . Small bowel obstruction, partial ([place]) 05/29/2013  . Chronic diastolic CHF (congestive heart failure) ([place]) 03/22/2013  . GI bleeding 03/21/2013  . Acute blood loss anemia 03/21/2013  . Vaginal odor 03/12/2013  . Vaginal discharge 03/12/2013  . Occlusion and stenosis of carotid artery without mention of cerebral infarction 01/24/2013  . Hx of CABG 07/05/2012  . Carotid artery disease ([place]) 07/05/2012  . Anemia of chronic kidney failure 07/03/2012  . Secondary hyperparathyroidism ([place]) 07/03/2012  . Mitral regurgitation 06/12/2012  . Pneumonia 06/09/2012  . Non-STEMI (non-ST elevated myocardial infarction) ([place]) 06/08/2012  . AVM (arteriovenous malformation) of small bowel, acquired ([place]) 01/20/2012  . GERD (gastroesophageal reflux disease) 01/09/2012  . HLD (hyperlipidemia) 01/05/2012  . Atherosclerosis of native coronary artery of native heart without angina pectoris 12/16/2011  . ESRD on hemodialysis ([place]) 12/16/2011    Past Surgical History:  Procedure Laterality Date  . ABDOMINAL HYSTERECTOMY  1992  . APPENDECTOMY  06/1990  . AV FISTULA PLACEMENT  07/2009   left upper arm  . AV FISTULA PLACEMENT Right 09/06/2016   Procedure: RIGHT FOREARM ARTERIOVENOUS (AV) GRAFT;  Surgeon: Elam Dutch, MD;  Location: [facility];  Service: Vascular;  Laterality: Right;  . [place] REMOVAL Right 09/06/2016   Procedure: REMOVAL OF Right Arm ARTERIOVENOUS GORETEX GRAFT and Vein Patch angioplasty of brachial artery;  Surgeon: Angelia Mould, MD;  Location: [facility];  Service: Vascular;  Laterality: Right;  . COLON RESECTION  1992  . CORONARY ANGIOPLASTY WITH STENT PLACEMENT  12/15/11   "2"  . CORONARY ANGIOPLASTY WITH STENT PLACEMENT  y/2013   "1; makes total of 3" (05/02/2012)  . CORONARY ARTERY BYPASS GRAFT  06/13/2012   Procedure: CORONARY ARTERY BYPASS GRAFTING (CABG);  Surgeon: Grace Isaac, MD;  Location: [facility];  Service: Open Heart Surgery;   Laterality: N/A;  cabg x four;  using left internal mammary artery, and left leg greater saphenous vein harvested endoscopically  . CORONARY STENT INTERVENTION N/A 10/13/2016   Procedure: Coronary Stent Intervention;  Surgeon: Troy Sine, MD;  Location: [facility] CV LAB;  Service: Cardiovascular;  Laterality: N/A;  . DILATION AND CURETTAGE OF UTERUS    . ESOPHAGOGASTRODUODENOSCOPY  01/20/2012   Procedure: ESOPHAGOGASTRODUODENOSCOPY (EGD);  Surgeon: Ladene Artist, MD,FACG;  Location: [facility] ENDOSCOPY;  Service: Endoscopy;  Laterality: N/A;  . ESOPHAGOGASTRODUODENOSCOPY N/A 03/26/2013   Procedure: ESOPHAGOGASTRODUODENOSCOPY (EGD);  Surgeon: Irene Shipper, MD;  [facility];  Service: Endoscopy;  Laterality: N/A;  . ESOPHAGOGASTRODUODENOSCOPY N/A 04/30/2015   Procedure: ESOPHAGOGASTRODUODENOSCOPY (EGD);  Surgeon: Rogene Houston, MD;  [facility];  Service: Endoscopy;  Laterality: N/A;  1pm - moved to 10/20 @ 1:10  . ESOPHAGOGASTRODUODENOSCOPY N/A 07/29/2016   Procedure: ESOPHAGOGASTRODUODENOSCOPY (EGD);  Surgeon: Manus Gunning, MD;  Location: [facility];  Service: Gastroenterology;  Laterality: N/A;  enteroscopy  . INTRAOPERATIVE TRANSESOPHAGEAL ECHOCARDIOGRAM  06/13/2012   Procedure: INTRAOPERATIVE TRANSESOPHAGEAL ECHOCARDIOGRAM;  Surgeon: Grace Isaac, MD;  Location: [facility];  Service: Open Heart Surgery;  Laterality: N/A;  . IR GENERIC HISTORICAL  07/26/2016   IR FLUORO GUIDE CV LINE RIGHT 07/26/2016 Greggory Keen, MD [facility]  . IR GENERIC HISTORICAL  07/26/2016   IR US GUIDE VASC ACCESS RIGHT 07/26/2016 Greggory Keen, MD [facility]  . IR GENERIC HISTORICAL  08/02/2016   IR US GUIDE VASC ACCESS RIGHT 08/02/2016 Greggory Keen, MD [facility]  . IR GENERIC HISTORICAL  08/02/2016   IR FLUORO GUIDE CV LINE RIGHT 08/02/2016 Greggory Keen, MD [facility]  . LEFT HEART CATH AND CORONARY ANGIOGRAPHY N/A 09/20/2016   Procedure: Left Heart Cath and Coronary  Angiography;  Surgeon: [name], MD;  Location: [facility] CV LAB;  Service: Cardiovascular;  Laterality: N/A;  . LEFT HEART CATH AND CORS/GRAFTS ANGIOGRAPHY N/A 10/13/2016   Procedure: Left Heart Cath and Cors/Grafts Angiography;  Surgeon: Troy Sine, MD;  Location: [facility] CV LAB;  Service: Cardiovascular;  Laterality: N/A;  . LEFT HEART CATHETERIZATION WITH CORONARY ANGIOGRAM N/A 12/15/2011   Procedure: LEFT HEART CATHETERIZATION WITH CORONARY ANGIOGRAM;  Surgeon: Burnell Blanks, MD;  Location: [facility] CATH LAB;  Service: Cardiovascular;  Laterality: N/A;  . LEFT HEART CATHETERIZATION WITH CORONARY ANGIOGRAM N/A 01/10/2012   Procedure: LEFT HEART CATHETERIZATION WITH CORONARY ANGIOGRAM;  Surgeon: Peter M Martinique, MD;  Location: [facility] CATH LAB;  Service: Cardiovascular;  Laterality: N/A;  . LEFT HEART CATHETERIZATION WITH CORONARY ANGIOGRAM N/A 06/08/2012   Procedure: LEFT HEART CATHETERIZATION WITH CORONARY ANGIOGRAM;  Surgeon: Burnell Blanks, MD;  Location: [facility] CATH LAB;  Service: Cardiovascular;  Laterality: N/A;  . LEFT HEART CATHETERIZATION WITH CORONARY/GRAFT ANGIOGRAM N/A 12/10/2013   Procedure: LEFT HEART CATHETERIZATION WITH Beatrix Fetters;  Surgeon: [name], MD;  Location: [facility] CATH LAB;  Service: Cardiovascular;  Laterality: N/A;  . OVARY SURGERY     ovarian cancer  . SHUNTOGRAM N/A 10/15/2013   Procedure: Fistulogram;  Surgeon: Serafina Mitchell, MD;  Location: [facility] CATH LAB;  Service: Cardiovascular;  Laterality: N/A;  . THROMBECTOMY / ARTERIOVENOUS GRAFT REVISION  2011   left upper arm  . TUBAL LIGATION  1980's    OB History    Gravida Para Term Preterm AB Living   2 2   2   2    SAB TAB Ectopic Multiple Live Births                   Home Medications    Prior to Admission medications   Medication Sig Start Date End Date Taking? Authorizing Provider  ALPRAZolam (XANAX) 0.25 MG tablet Take 0.25 mg by mouth 2 (two) times daily as needed for  anxiety.  11/19/15   [provider]  amiodarone (PACERONE) 200 MG tablet  10/25/16   [provider]  amLODipine (NORVASC) 2.5 MG tablet Take 2.5 mg by mouth daily.    [provider]  aspirin EC 81 MG tablet Take 81 mg by mouth daily.     [provider]  atorvastatin (LIPITOR) 40 MG tablet Take 1 tablet (40 mg total) by mouth daily at 6 PM. Patient taking differently: Take 40 mg by mouth at bedtime.  09/22/16   Delos Haring, PA-C  benzonatate (TESSALON) 100 MG capsule Take 1 capsule (100 mg total) by mouth every 8 (eight) hours. 11/18/16   Varney Biles, MD  carvedilol (COREG)  6.25 MG tablet Take 1 tablet (6.25 mg total) by mouth 2 (two) times daily with a meal. 11/16/16   Herminio Commons, MD  cinacalcet (SENSIPAR) 30 MG tablet Take 30 mg by mouth daily after supper.    [provider]  cloNIDine (CATAPRES) 0.2 MG tablet  10/28/16   [provider]  epoetin alfa (EPOGEN,PROCRIT) 16010 UNIT/ML injection Inject 1 mL (10,000 Units total) into the vein every Monday, Wednesday, and Friday with hemodialysis. 04/11/16   Orvan Falconer, MD  fluticasone (FLONASE) 50 MCG/ACT nasal spray Place 2 sprays into the nose daily as needed for allergies.     [provider]  folic acid-vitamin b complex-vitamin c-selenium-zinc (DIALYVITE) 3 MG TABS tablet Take 1 tablet by mouth daily.    [provider]  hydrALAZINE (APRESOLINE) 50 MG tablet Take 2 tablets (100 mg total) by mouth 3 (three) times daily. 11/10/16 02/08/17  Herminio Commons, MD  isosorbide mononitrate (IMDUR) 120 MG 24 hr tablet Take 1 tablet (120 mg total) by mouth daily. 09/22/16   Delos Haring, PA-C  loratadine (CLARITIN) 10 MG tablet Take 10 mg by mouth daily as needed for allergies.    [provider]  nitroGLYCERIN (NITROSTAT) 0.4 MG SL tablet Place 1 tablet (0.4 mg total) under the tongue every 5 (five) minutes as needed for chest pain. 08/02/16   Kinnie Feil,  MD  omeprazole (PRILOSEC) 20 MG capsule Take 20 mg by mouth daily as needed (acid reflux/heartburn).  09/12/16   [provider]  ticagrelor (BRILINTA) 90 MG TABS tablet Take 1 tablet (90 mg total) by mouth 2 (two) times daily. 10/14/16   Leanor Kail, PA    Family History Family History  Problem Relation Age of Onset  . Heart disease Mother        Heart Disease before age 26  . Hyperlipidemia Mother   . Hypertension Mother   . Diabetes Mother   . Heart attack Mother   . Heart disease Father        Heart Disease before age 67  . Hyperlipidemia Father   . Hypertension Father   . Diabetes Father   . Diabetes Sister   . Hypertension Sister   . Diabetes Brother   . Hyperlipidemia Brother   . Heart attack Brother   . Hypertension Sister   . Heart attack Brother   . Other Unknown        noncontributory for early CAD  . Colon cancer Neg Hx   . Esophageal cancer Neg Hx   . Liver disease Neg Hx   . Kidney disease Neg Hx   . Colon polyps Neg Hx     Social History Social History  Substance Use Topics  . Smoking status: Never Smoker  . Smokeless tobacco: Never Used  . Alcohol use No     Allergies   Aspirin; Penicillins; Bactrim [sulfamethoxazole-trimethoprim]; Contrast media [iodinated diagnostic agents]; Iron; Nitrofurantoin; Prilosec [omeprazole]; Tylenol [acetaminophen]; Dexilant [dexlansoprazole]; Levaquin [levofloxacin in d5w]; Morphine and related; Plavix [clopidogrel bisulfate]; Protonix [pantoprazole sodium]; and Venofer [ferric oxide]   Review of Systems Review of Systems All other systems reviewed and are negative for acute change except as noted in the HPI.  Physical Exam Updated Vital Signs BP (!) 156/79   Pulse 63   Temp 98.8 F (37.1 C) (Oral)   Resp 14   Ht 5\' 2"  (1.575 m)   Wt 124 lb (56.2 kg)   SpO2 97%   BMI 22.68 kg/m  Physical Exam  Constitutional: She is oriented to person, place, and time. She appears well-developed and  well-nourished. No distress.  HENT:  Head: Normocephalic and atraumatic.  Eyes: EOM are normal.  Neck: Normal range of motion.  Cardiovascular: Normal rate, regular rhythm and normal heart sounds.   Pulmonary/Chest: Effort normal and breath sounds normal.  Abdominal: Soft. Bowel sounds are normal. She exhibits no distension. There is tenderness.  Tenderness generalized to lower quadrants mostly, particularly left lower quadrant.   Musculoskeletal: Normal range of motion.  Neurological: She is alert and oriented to person, place, and time.  Skin: Skin is warm and dry.  Psychiatric: She has a normal mood and affect. Judgment normal.  Nursing note and vitals reviewed.    ED Treatments / Results  DIAGNOSTIC STUDIES:  Oxygen Saturation is 97% on RA, normal by my interpretation.    COORDINATION OF CARE:  2:03 AM Discussed treatment plan with pt at bedside which includes CT imaging and pt agreed to plan.  Labs (all labs ordered are listed, but only abnormal results are displayed) Labs Reviewed  COMPREHENSIVE METABOLIC PANEL - Abnormal; Notable for the following:       Result Value   Sodium 132 (*)    Chloride 92 (*)    Glucose, Bld 124 (*)    BUN 32 (*)    Creatinine, Ser 7.89 (*)    Total Protein 5.8 (*)    Albumin 3.0 (*)    Total Bilirubin 0.2 (*)    GFR calc non Af Amer 4 (*)    GFR calc Af Amer 5 (*)    All other components within normal limits  CBC - Abnormal; Notable for the following:    RBC 3.43 (*)    Hemoglobin 10.6 (*)    HCT 33.0 (*)    RDW 18.2 (*)    All other components within normal limits  LIPASE, BLOOD    EKG  EKG Interpretation None       Radiology Ct Abdomen Pelvis Wo Contrast  Result Date: 11/18/2016 CLINICAL DATA:  Acute onset of lower abdominal pain, distention, nausea and diarrhea. Initial encounter. EXAM: CT ABDOMEN AND PELVIS WITHOUT CONTRAST TECHNIQUE: Multidetector CT imaging of the abdomen and pelvis was performed following the  standard protocol without IV contrast. COMPARISON:  Right upper quadrant ultrasound performed 10/03/2016, and CT of the abdomen and pelvis performed 03/03/2016 FINDINGS: Lower chest: Diffuse coronary artery calcifications are seen. Mild left basilar airspace opacity may reflect atelectasis or possibly mild infection. Hepatobiliary: The liver is unremarkable in appearance. Minimally increased attenuation within the gallbladder may reflect stones. The common bile duct remains normal in caliber. Pancreas: The pancreas is within normal limits. Spleen: The spleen is unremarkable in appearance. Adrenals/Urinary Tract: The adrenal glands are unremarkable in appearance. A heterogeneous mass arising at the upper pole of the right kidney measures 2.9 cm. This has increased gradually from 2.2 cm in 2014, and may reflect an indolent malignancy. A 1.6 cm nodule at the medial aspect of the left kidney is new from 2014, and increased from 1.0 cm in 2017. There is severe chronic bilateral renal atrophy, with minimal parenchymal calcification on the left, and mild nonspecific perinephric stranding bilaterally. There is no evidence of hydronephrosis. No renal or ureteral stones are identified. Stomach/Bowel: The stomach is unremarkable in appearance. The small bowel is within normal limits. The patient is status post appendectomy. Scattered diverticulosis is noted along the entirety of the visualized colon. A bowel suture line is noted  at the distal sigmoid colon. Vascular/Lymphatic: Diffuse calcification is seen along the abdominal aorta and its branches. The abdominal aorta is otherwise grossly unremarkable. The inferior vena cava is grossly unremarkable. No retroperitoneal lymphadenopathy is seen. No pelvic sidewall lymphadenopathy is identified. Reproductive: The bladder is decompressed and not well assessed. The patient is status post hysterectomy. No suspicious adnexal masses are seen. Other: No additional soft tissue  abnormalities are seen. Musculoskeletal: No acute osseous abnormalities are identified. The visualized musculature is unremarkable in appearance. IMPRESSION: 1. Mild left basilar airspace opacity may reflect atelectasis or possibly mild infection. 2. Heterogeneous mass arising at the upper pole of the right kidney measures 2.9 cm. This has gradually increased in size from 2.2 cm in 2014. This may reflect indolent malignancy. MRI of the abdomen without contrast is recommended for further evaluation, as deemed clinically appropriate. 3. 1.6 cm nodule with the medial aspect of the left kidney is new from 2014, and increased from 1.0 cm in 2017. This could also reflect malignancy. 4. Severe chronic bilateral renal atrophy. 5. Scattered diverticulosis along the entirety of the visualized colon. 6. Diffuse aortic atherosclerosis. 7. Question of small gallstones. Gallbladder otherwise unremarkable. 8. Diffuse coronary artery calcifications seen. Electronically Signed   By: Garald Balding M.D.   On: 11/18/2016 03:47    Procedures Procedures (including critical care time)  Medications Ordered in ED Medications  iopamidol (ISOVUE-300) 61 % injection (  Hold 11/18/16 0241)     Initial Impression / Assessment and Plan / ED Course  I have reviewed the triage vital signs and the nursing notes.  Pertinent labs & imaging results that were available during my care of the patient were reviewed by me and considered in my medical decision making (see chart for details).  Clinical Course as of Nov 18 512  Fri Nov 18, 2016  6659 Results from the ER workup discussed with the patient face to face and all questions answered to the best of my ability. Pt advised to see her PCP or Renal doctor for the renal mass. PT has no fevers, pleuritic chest pain, so no pneumonia suspected. PT will be given a CD copy of the CT scan. CT Abdomen Pelvis Wo Contrast [AN]    Clinical Course User Index [AN] Varney Biles, MD    Pt  comes in with cc of abd pain x 1 week. Atypical, waxing and waning pain with diarrhea and then constipation. Abd exam is not peritoneal and pt has no focal tenderness, although LLQ was the worst. CT ordered to ensure there is no SBO or diverticulitis.  Final Clinical Impressions(s) / ED Diagnoses   Final diagnoses:  Renal mass of unknown nature  Cough    New Prescriptions New Prescriptions   BENZONATATE (TESSALON) 100 MG CAPSULE    Take 1 capsule (100 mg total) by mouth every 8 (eight) hours.   I personally performed the services described in this documentation, which was scribed in my presence. The recorded information has been reviewed and is accurate.     Varney Biles, MD 11/18/16 [phone]

## 2016-11-18 NOTE — ED Notes (Signed)
Pt refused contrast for CT, pt worried she will become SOB if she drinks to much

## 2016-11-18 NOTE — Discharge Instructions (Addendum)
There is a 2.9 cm mass on the right kidney and 1.6 cm mass on the left kidney that needs further evaluation. Please see your primary care doctor or kidney doctor as soon as possible.  Please return to the ER if your symptoms worsen; you have increased pain, fevers, chills, inability to keep any medications down, confusion. Otherwise see the outpatient doctor as requested.

## 2016-11-20 ENCOUNTER — Other Ambulatory Visit (INDEPENDENT_AMBULATORY_CARE_PROVIDER_SITE_OTHER): Payer: Self-pay | Admitting: Vascular Surgery

## 2016-11-21 ENCOUNTER — Encounter (INDEPENDENT_AMBULATORY_CARE_PROVIDER_SITE_OTHER): Payer: Self-pay | Admitting: Internal Medicine

## 2016-11-21 ENCOUNTER — Ambulatory Visit (INDEPENDENT_AMBULATORY_CARE_PROVIDER_SITE_OTHER): Payer: Medicare HMO | Admitting: Internal Medicine

## 2016-11-21 DIAGNOSIS — Z992 Dependence on renal dialysis: Secondary | ICD-10-CM | POA: Diagnosis not present

## 2016-11-21 DIAGNOSIS — N186 End stage renal disease: Secondary | ICD-10-CM | POA: Diagnosis not present

## 2016-11-24 ENCOUNTER — Ambulatory Visit: Payer: Medicare HMO | Admitting: Vascular Surgery

## 2016-11-24 DIAGNOSIS — Z992 Dependence on renal dialysis: Secondary | ICD-10-CM | POA: Diagnosis not present

## 2016-11-24 DIAGNOSIS — N186 End stage renal disease: Secondary | ICD-10-CM | POA: Diagnosis not present

## 2016-11-25 DIAGNOSIS — N186 End stage renal disease: Secondary | ICD-10-CM | POA: Diagnosis not present

## 2016-11-25 DIAGNOSIS — Z992 Dependence on renal dialysis: Secondary | ICD-10-CM | POA: Diagnosis not present

## 2016-11-28 ENCOUNTER — Other Ambulatory Visit (INDEPENDENT_AMBULATORY_CARE_PROVIDER_SITE_OTHER): Payer: Self-pay | Admitting: Vascular Surgery

## 2016-11-28 DIAGNOSIS — I10 Essential (primary) hypertension: Secondary | ICD-10-CM | POA: Diagnosis not present

## 2016-11-28 DIAGNOSIS — I4891 Unspecified atrial fibrillation: Secondary | ICD-10-CM | POA: Diagnosis not present

## 2016-11-28 DIAGNOSIS — Z6824 Body mass index (BMI) 24.0-24.9, adult: Secondary | ICD-10-CM | POA: Diagnosis not present

## 2016-11-28 DIAGNOSIS — N185 Chronic kidney disease, stage 5: Secondary | ICD-10-CM | POA: Diagnosis not present

## 2016-11-28 DIAGNOSIS — Z992 Dependence on renal dialysis: Secondary | ICD-10-CM | POA: Diagnosis not present

## 2016-11-28 DIAGNOSIS — N186 End stage renal disease: Secondary | ICD-10-CM | POA: Diagnosis not present

## 2016-11-30 DIAGNOSIS — N186 End stage renal disease: Secondary | ICD-10-CM | POA: Diagnosis not present

## 2016-11-30 DIAGNOSIS — Z992 Dependence on renal dialysis: Secondary | ICD-10-CM | POA: Diagnosis not present

## 2016-12-02 DIAGNOSIS — N186 End stage renal disease: Secondary | ICD-10-CM | POA: Diagnosis not present

## 2016-12-02 DIAGNOSIS — Z992 Dependence on renal dialysis: Secondary | ICD-10-CM | POA: Diagnosis not present

## 2016-12-05 DIAGNOSIS — Z992 Dependence on renal dialysis: Secondary | ICD-10-CM | POA: Diagnosis not present

## 2016-12-05 DIAGNOSIS — N186 End stage renal disease: Secondary | ICD-10-CM | POA: Diagnosis not present

## 2016-12-05 MED ORDER — CLINDAMYCIN PHOSPHATE 300 MG/50ML IV SOLN
300.0000 mg | Freq: Once | INTRAVENOUS | Status: AC
  Administered 2016-12-06: 300 mg via INTRAVENOUS

## 2016-12-06 ENCOUNTER — Encounter
Admission: RE | Disposition: A | Discharge status: Home / Self Care | Payer: Self-pay | Source: Ambulatory Visit | Attending: Vascular Surgery

## 2016-12-06 ENCOUNTER — Ambulatory Visit
Admission: RE | Admit: 2016-12-06 | Discharge: 2016-12-06 | Disposition: A | Discharge status: Home / Self Care | Payer: Medicare HMO | Source: Ambulatory Visit | Attending: Vascular Surgery | Admitting: Vascular Surgery

## 2016-12-06 ENCOUNTER — Encounter: Payer: Self-pay | Admitting: *Deleted

## 2016-12-06 DIAGNOSIS — T82868A Thrombosis of vascular prosthetic devices, implants and grafts, initial encounter: Secondary | ICD-10-CM | POA: Diagnosis not present

## 2016-12-06 DIAGNOSIS — Z992 Dependence on renal dialysis: Secondary | ICD-10-CM | POA: Diagnosis not present

## 2016-12-06 DIAGNOSIS — I6522 Occlusion and stenosis of left carotid artery: Secondary | ICD-10-CM | POA: Diagnosis not present

## 2016-12-06 DIAGNOSIS — Z951 Presence of aortocoronary bypass graft: Secondary | ICD-10-CM | POA: Insufficient documentation

## 2016-12-06 DIAGNOSIS — N186 End stage renal disease: Secondary | ICD-10-CM | POA: Diagnosis not present

## 2016-12-06 DIAGNOSIS — I252 Old myocardial infarction: Secondary | ICD-10-CM | POA: Diagnosis not present

## 2016-12-06 DIAGNOSIS — I5032 Chronic diastolic (congestive) heart failure: Secondary | ICD-10-CM | POA: Insufficient documentation

## 2016-12-06 DIAGNOSIS — K5520 Angiodysplasia of colon without hemorrhage: Secondary | ICD-10-CM | POA: Diagnosis not present

## 2016-12-06 DIAGNOSIS — Z9889 Other specified postprocedural states: Secondary | ICD-10-CM | POA: Diagnosis not present

## 2016-12-06 DIAGNOSIS — Z90710 Acquired absence of both cervix and uterus: Secondary | ICD-10-CM | POA: Insufficient documentation

## 2016-12-06 DIAGNOSIS — Z8711 Personal history of peptic ulcer disease: Secondary | ICD-10-CM | POA: Insufficient documentation

## 2016-12-06 DIAGNOSIS — Z91041 Radiographic dye allergy status: Secondary | ICD-10-CM | POA: Insufficient documentation

## 2016-12-06 DIAGNOSIS — I871 Compression of vein: Secondary | ICD-10-CM | POA: Diagnosis not present

## 2016-12-06 DIAGNOSIS — D509 Iron deficiency anemia, unspecified: Secondary | ICD-10-CM | POA: Insufficient documentation

## 2016-12-06 DIAGNOSIS — I051 Rheumatic mitral insufficiency: Secondary | ICD-10-CM | POA: Insufficient documentation

## 2016-12-06 DIAGNOSIS — M109 Gout, unspecified: Secondary | ICD-10-CM | POA: Insufficient documentation

## 2016-12-06 DIAGNOSIS — Z885 Allergy status to narcotic agent status: Secondary | ICD-10-CM | POA: Diagnosis not present

## 2016-12-06 DIAGNOSIS — K219 Gastro-esophageal reflux disease without esophagitis: Secondary | ICD-10-CM | POA: Diagnosis not present

## 2016-12-06 DIAGNOSIS — Z8543 Personal history of malignant neoplasm of ovary: Secondary | ICD-10-CM | POA: Insufficient documentation

## 2016-12-06 DIAGNOSIS — I132 Hypertensive heart and chronic kidney disease with heart failure and with stage 5 chronic kidney disease, or end stage renal disease: Secondary | ICD-10-CM | POA: Insufficient documentation

## 2016-12-06 DIAGNOSIS — T82590A Other mechanical complication of surgically created arteriovenous fistula, initial encounter: Secondary | ICD-10-CM | POA: Diagnosis not present

## 2016-12-06 DIAGNOSIS — Z85038 Personal history of other malignant neoplasm of large intestine: Secondary | ICD-10-CM | POA: Diagnosis not present

## 2016-12-06 DIAGNOSIS — Z8249 Family history of ischemic heart disease and other diseases of the circulatory system: Secondary | ICD-10-CM | POA: Insufficient documentation

## 2016-12-06 DIAGNOSIS — M1990 Unspecified osteoarthritis, unspecified site: Secondary | ICD-10-CM | POA: Diagnosis not present

## 2016-12-06 DIAGNOSIS — Z880 Allergy status to penicillin: Secondary | ICD-10-CM | POA: Insufficient documentation

## 2016-12-06 DIAGNOSIS — E7800 Pure hypercholesterolemia, unspecified: Secondary | ICD-10-CM | POA: Insufficient documentation

## 2016-12-06 DIAGNOSIS — Z833 Family history of diabetes mellitus: Secondary | ICD-10-CM | POA: Insufficient documentation

## 2016-12-06 DIAGNOSIS — Z8673 Personal history of transient ischemic attack (TIA), and cerebral infarction without residual deficits: Secondary | ICD-10-CM | POA: Insufficient documentation

## 2016-12-06 DIAGNOSIS — Z955 Presence of coronary angioplasty implant and graft: Secondary | ICD-10-CM | POA: Insufficient documentation

## 2016-12-06 DIAGNOSIS — Z888 Allergy status to other drugs, medicaments and biological substances status: Secondary | ICD-10-CM | POA: Insufficient documentation

## 2016-12-06 DIAGNOSIS — Y832 Surgical operation with anastomosis, bypass or graft as the cause of abnormal reaction of the patient, or of later complication, without mention of misadventure at the time of the procedure: Secondary | ICD-10-CM | POA: Insufficient documentation

## 2016-12-06 DIAGNOSIS — I2510 Atherosclerotic heart disease of native coronary artery without angina pectoris: Secondary | ICD-10-CM | POA: Diagnosis not present

## 2016-12-06 DIAGNOSIS — Z886 Allergy status to analgesic agent status: Secondary | ICD-10-CM | POA: Insufficient documentation

## 2016-12-06 DIAGNOSIS — Z7982 Long term (current) use of aspirin: Secondary | ICD-10-CM | POA: Insufficient documentation

## 2016-12-06 HISTORY — PX: UPPER EXTREMITY ANGIOGRAPHY: CATH118270

## 2016-12-06 LAB — POTASSIUM (ARMC VASCULAR LAB ONLY): Potassium (ARMC vascular lab): 4.3 (ref 3.5–5.1)

## 2016-12-06 SURGERY — UPPER EXTREMITY ANGIOGRAPHY
Anesthesia: Moderate Sedation | Laterality: Bilateral

## 2016-12-06 MED ORDER — MIDAZOLAM HCL 5 MG/5ML IJ SOLN
INTRAMUSCULAR | Status: AC
  Filled 2016-12-06: qty 5

## 2016-12-06 MED ORDER — FAMOTIDINE 20 MG PO TABS
ORAL_TABLET | ORAL | Status: AC
  Filled 2016-12-06: qty 2

## 2016-12-06 MED ORDER — FENTANYL CITRATE (PF) 100 MCG/2ML IJ SOLN
INTRAMUSCULAR | Status: DC | PRN
  Administered 2016-12-06: 50 ug via INTRAVENOUS

## 2016-12-06 MED ORDER — HYDROMORPHONE HCL 1 MG/ML IJ SOLN: 1.0000 mg | Freq: Once | INTRAMUSCULAR | Status: DC | PRN

## 2016-12-06 MED ORDER — HEPARIN (PORCINE) IN NACL 2-0.9 UNIT/ML-% IJ SOLN
INTRAMUSCULAR | Status: AC
  Filled 2016-12-06: qty 1000

## 2016-12-06 MED ORDER — FAMOTIDINE 20 MG PO TABS
40.0000 mg | ORAL_TABLET | ORAL | Status: DC | PRN
  Administered 2016-12-06: 40 mg via ORAL

## 2016-12-06 MED ORDER — SODIUM CHLORIDE 0.9 % IV SOLN
INTRAVENOUS | Status: DC
  Administered 2016-12-06: 09:00:00 via INTRAVENOUS

## 2016-12-06 MED ORDER — LIDOCAINE HCL (PF) 1 % IJ SOLN
INTRAMUSCULAR | Status: AC
  Filled 2016-12-06: qty 30

## 2016-12-06 MED ORDER — METHYLPREDNISOLONE SODIUM SUCC 125 MG IJ SOLR
INTRAMUSCULAR | Status: AC
  Administered 2016-12-06: 125 mg via INTRAVENOUS
  Filled 2016-12-06: qty 2

## 2016-12-06 MED ORDER — ONDANSETRON HCL 4 MG/2ML IJ SOLN: 4.0000 mg | Freq: Four times a day (QID) | INTRAMUSCULAR | Status: DC | PRN

## 2016-12-06 MED ORDER — HEPARIN SODIUM (PORCINE) 1000 UNIT/ML IJ SOLN
INTRAMUSCULAR | Status: AC
  Filled 2016-12-06: qty 1

## 2016-12-06 MED ORDER — MIDAZOLAM HCL 2 MG/2ML IJ SOLN
INTRAMUSCULAR | Status: DC | PRN
  Administered 2016-12-06: 2 mg via INTRAVENOUS

## 2016-12-06 MED ORDER — LIDOCAINE HCL (PF) 1 % IJ SOLN
INTRAMUSCULAR | Status: AC
  Filled 2016-12-06: qty 10

## 2016-12-06 MED ORDER — CLINDAMYCIN PHOSPHATE 300 MG/50ML IV SOLN
INTRAVENOUS | Status: AC
  Administered 2016-12-06: 300 mg via INTRAVENOUS
  Filled 2016-12-06: qty 50

## 2016-12-06 MED ORDER — IOPAMIDOL (ISOVUE-300) INJECTION 61%
INTRAVENOUS | Status: DC | PRN
Start: 2016-12-06 — End: 2016-12-06
  Administered 2016-12-06: 35 mL via INTRA_ARTERIAL

## 2016-12-06 MED ORDER — FENTANYL CITRATE (PF) 100 MCG/2ML IJ SOLN
INTRAMUSCULAR | Status: AC
Start: 2016-12-06 — End: 2016-12-06
  Filled 2016-12-06: qty 2

## 2016-12-06 MED ORDER — METHYLPREDNISOLONE SODIUM SUCC 125 MG IJ SOLR
125.0000 mg | INTRAMUSCULAR | Status: DC | PRN
  Administered 2016-12-06: 125 mg via INTRAVENOUS

## 2016-12-06 SURGICAL SUPPLY — 11 items
CATH BEACON 5 .035 40 KMP TP (CATHETERS) ×1 IMPLANT
CATH BEACON 5 .038 40 KMP TP (CATHETERS) ×1
DEVICE TORQUE (MISCELLANEOUS) ×2 IMPLANT
DRAPE BRACHIAL (DRAPES) ×4 IMPLANT
GUIDEWIRE ANGLED .035 180CM (WIRE) ×2 IMPLANT
NEEDLE ENTRY 21GA 7CM ECHOTIP (NEEDLE) ×2 IMPLANT
PACK ANGIOGRAPHY (CUSTOM PROCEDURE TRAY) ×2 IMPLANT
PREP CHG 10.5 TEAL (MISCELLANEOUS) ×2 IMPLANT
SET INTRO CAPELLA COAXIAL (SET/KITS/TRAYS/PACK) ×2 IMPLANT
SUT MNCRL AB 4-0 PS2 18 (SUTURE) ×2 IMPLANT
TOWEL OR 17X26 4PK STRL BLUE (TOWEL DISPOSABLE) ×4 IMPLANT

## 2016-12-06 NOTE — H&P (Signed)
[facility] History & Physical Update  The patient was interviewed and re-examined.  The patient's previous History and Physical has been reviewed and is unchanged.  There is no change in the plan of care. We plan to proceed with the scheduled procedure.  Hortencia Pilar, MD  12/06/2016, 10:43 AM

## 2016-12-06 NOTE — Op Note (Signed)
OPERATIVE NOTE   PROCEDURE: 1. Introduction catheter into superior vena cava right brachial vein approach 2. Introduction catheter into superior vena cava left brachial vein approach 3. Contrast injection left AV access  PRE-OPERATIVE DIAGNOSIS: Complication of dialysis access                                                       End Stage Renal Disease  POST-OPERATIVE DIAGNOSIS: same as above   SURGEON: Katha Cabal, M.D.  ANESTHESIA: Conscious sedation was administered under my direct supervision by the interventional radiology RN.  IV Versed plus fentanyl were utilized. Continuous ECG, pulse oximetry and blood pressure was monitored throughout the entire procedure.  Conscious sedation was for a total of 32 minutes.  ESTIMATED BLOOD LOSS: minimal  FINDING(S): 1. The right subclavian demonstrates a focal 70% narrowing just before the confluence with the internal jugular. The left central venous system is widely patent. The left upper arm AV access is occluded in its midportion. There is still patency of the aneurysmal areas with pulsatile blood flow. Visualized portions of the arterial inflow are widely patent.  SPECIMEN(S):  None  CONTRAST: 35 cc  FLUOROSCOPY TIME: 0.7 minutes  INDICATIONS: Shawna [name] is a 76 y.o. female who  presents with malfunctioning left arm AV access.  The patient is scheduled for angiography with possible intervention of the AV access to prevent loss of the permanent access.  The patient is aware the risks include but are not limited to: bleeding, infection, thrombosis of the cannulated access, and possible anaphylactic reaction to the contrast.  The patient acknowledges if the access can not be salvaged a tunneled catheter will be needed and will be placed during this procedure.  The patient is aware of the risks of the procedure and elects to proceed with the angiogram and intervention.  DESCRIPTION: After full informed written consent was obtained,  the patient was brought back to the Special Procedure suite and placed supine position.  Appropriate cardiopulmonary monitors were placed.  The left and right arms were prepped and draped in the standard fashion.  Appropriate timeout is called.   The right brachial vein was cannulated with a micropuncture needle under ultrasound guidence.  Ultrasound was used to evaluate the right brachial vein.  It was echolucent and compressible indicating it is patent .  An ultrasound image was acquired for the permanent record.  A micropuncture needle was used to access the right brachial vein under direct ultrasound guidance.  The microwire was then advanced under fluoroscopic guidance without difficulty followed by the micro-sheath.  Hand injection contrast was then used to demonstrate the venous anatomy of the right upper arm. Images of the central veins were incomplete by hand injection and therefore a floppy Glidewire was advanced the micro-sheath was removed and a KMP catheter was then advanced over the wire into the superior vena cava. Magnified imaging so the central veins were obtained.  The left arm access was cannulated with a micropuncture needle under ultrasound guidence.  Ultrasound was used to evaluate the left arm access.  It was echolucent and compressible indicating it is patent .  An ultrasound image was acquired for the permanent record.  A micropuncture needle was used to access the left arm access under direct ultrasound guidance.  The microwire was then advanced under fluoroscopic  guidance without difficulty followed by the micro-sheath.  Hand injections were completed to image the access from the arterial anastomosis through the entire access.   The left brachial vein access was cannulated with a micropuncture needle under ultrasound guidence.  Ultrasound was used to evaluate the left brachial vein access.  It was echolucent and compressible indicating it is patent .  An ultrasound image was acquired  for the permanent record.  A micropuncture needle was used to access the left brachial vein access under direct ultrasound guidance.  The microwire was then advanced under fluoroscopic guidance without difficulty followed by the micro-sheath.   Hand injection contrast was then used to demonstrate the venous anatomy of the right upper arm. Images of the central veins were incomplete by hand injection and therefore a floppy Glidewire was advanced the micro-sheath was removed and a KMP catheter was then advanced over the wire into the superior vena cava. Magnified imaging so the central veins were obtained.   Based on the images,  on the right the veins of the upper arm including the brachial and axillary veins are widely patent. The subclavian vein demonstrates a focal 70% stenosis just before its confluence with the internal jugular vein. Tunneled catheter is noted and in good position. The innominate is somewhat small in diameter but does not appear to have a flow limiting stricture or stenosis. Superior vena cava is patent. On the left imaging of the veins of the upper arm including the brachial and axillary veins are widely patent. The subclavian innominate and superior vena cava are patent magnified images of these were also obtained by direct catheter injection within the central venous system as described above.  The AV access demonstrates a occlusion just beyond the 2 large aneurysmal areas. There is persistent flow within the aneurysmal areas. Contrast is refluxed into the brachial artery which is patent from the level of the axilla down to the trifurcation.    A 4-0 Monocryl purse-string suture was sewn around the sheath.  The sheath was removed and light pressure was applied.  A sterile bandage was applied to the puncture site.    COMPLICATIONS: none CONDITION: good  Katha Cabal, M.D [facility] Office: [phone]  12/06/2016 11:36 AM

## 2016-12-07 ENCOUNTER — Encounter: Payer: Self-pay | Admitting: Vascular Surgery

## 2016-12-07 DIAGNOSIS — N186 End stage renal disease: Secondary | ICD-10-CM | POA: Diagnosis not present

## 2016-12-07 DIAGNOSIS — Z992 Dependence on renal dialysis: Secondary | ICD-10-CM | POA: Diagnosis not present

## 2016-12-08 ENCOUNTER — Other Ambulatory Visit: Payer: Self-pay | Admitting: Cardiovascular Disease

## 2016-12-08 DIAGNOSIS — N186 End stage renal disease: Secondary | ICD-10-CM | POA: Diagnosis not present

## 2016-12-08 DIAGNOSIS — Z992 Dependence on renal dialysis: Secondary | ICD-10-CM | POA: Diagnosis not present

## 2016-12-08 MED ORDER — AMIODARONE HCL 200 MG PO TABS: 200.0000 mg | ORAL_TABLET | Freq: Every day | ORAL | 3 refills | Status: DC

## 2016-12-08 MED ORDER — CLONIDINE HCL 0.2 MG PO TABS: 0.2000 mg | ORAL_TABLET | Freq: Three times a day (TID) | ORAL | 3 refills | Status: DC

## 2016-12-08 NOTE — Telephone Encounter (Signed)
° °  1. Which medications need to be refilled? (please list name of each medication and dose if known)   Clonidine 0.2 mg  & Amiodarone 200 mg   2. Which pharmacy/location (including street and city if local pharmacy) is medication to be sent to?  [place], [place]   3. Do they need a 30 day or 90 day supply?  Patient states that she is out of her medications .

## 2016-12-09 ENCOUNTER — Telehealth (INDEPENDENT_AMBULATORY_CARE_PROVIDER_SITE_OTHER): Payer: Self-pay

## 2016-12-09 DIAGNOSIS — N186 End stage renal disease: Secondary | ICD-10-CM | POA: Diagnosis not present

## 2016-12-09 DIAGNOSIS — Z992 Dependence on renal dialysis: Secondary | ICD-10-CM | POA: Diagnosis not present

## 2016-12-09 NOTE — Telephone Encounter (Signed)
Returned the patient call regarding the tape on her arm and she understood. See note below.

## 2016-12-09 NOTE — Telephone Encounter (Signed)
Yes, there will a small incision underneath. You may shower. Please keep the incision area clean and dry. There may be minimal drainage - if this happens you can place a band aid over the site.

## 2016-12-09 NOTE — Telephone Encounter (Signed)
Patient called yesterday and left a message at 5:30pm on the nurse line stating she had a piece of tape on her arm and wanting to know when she should remove the tape. The discharge note does not address the patient's bandage or when to remove or change.

## 2016-12-09 NOTE — Addendum Note (Signed)
Addendum  created 12/09/16 1208 by Rica Koyanagi, MD   Sign clinical note

## 2016-12-12 ENCOUNTER — Telehealth: Payer: Self-pay | Admitting: Cardiovascular Disease

## 2016-12-12 NOTE — Telephone Encounter (Signed)
Patient states her gums have been bleeding and she has been falling a lot. Patient states she has fallen 3 times in the past couple of days and she states her legs have been giving out all since starting brilinta

## 2016-12-12 NOTE — Telephone Encounter (Signed)
Has started [place], but feels it is causing her to have other symptoms she would like to speak to nurse about

## 2016-12-12 NOTE — Telephone Encounter (Signed)
Can switch to Plavix.

## 2016-12-13 MED ORDER — PRASUGREL HCL 10 MG PO TABS
10.0000 mg | ORAL_TABLET | Freq: Every day | ORAL | 0 refills | Status: DC
Start: 2016-12-13 — End: 2016-12-19

## 2016-12-13 NOTE — Telephone Encounter (Signed)
Patients chart states she is allergic to plavix and it gives her a rash.

## 2016-12-13 NOTE — Telephone Encounter (Signed)
The only other option is Effient 10 mg.

## 2016-12-13 NOTE — Telephone Encounter (Signed)
Yes

## 2016-12-13 NOTE — Telephone Encounter (Signed)
Plavix 75mg ??

## 2016-12-14 NOTE — Telephone Encounter (Signed)
Then I would stick to her current Brilinta.

## 2016-12-14 NOTE — Telephone Encounter (Signed)
We do not have any samples neither does [place] office.

## 2016-12-14 NOTE — Telephone Encounter (Signed)
Shawna [name] called stating that she went to the pharmacy to pick up new medication and it is going to cost Her $170.00. States that she cannot afford medication.

## 2016-12-15 NOTE — Telephone Encounter (Signed)
No. It would not afford the same level of protection.

## 2016-12-15 NOTE — Telephone Encounter (Signed)
Patient wants to know if it would be ok for her to take Brilinta just once a day instead of twice a day.

## 2016-12-15 NOTE — Telephone Encounter (Signed)
Patient notified of Dr. Raylene Everts recommendation. Patient stated she would discuss with her husband and either go back to taking 2 a day or begin the effient. Patient stated she would call back and let us know what she was going to do.

## 2016-12-19 ENCOUNTER — Telehealth: Payer: Self-pay | Admitting: Cardiovascular Disease

## 2016-12-19 NOTE — Telephone Encounter (Signed)
Walked in complaining about itching since she started taking brilanta

## 2016-12-19 NOTE — Telephone Encounter (Signed)
Pt agreeable and will take Brilinta 90 mg bid and benadryl - update medication list

## 2016-12-19 NOTE — Telephone Encounter (Signed)
LM for pt to return call - routed to provider

## 2016-12-19 NOTE — Telephone Encounter (Signed)
She reportedly has a rash to Plavix and must take something. I would take Benadryl for the itching.

## 2016-12-23 DIAGNOSIS — Z6824 Body mass index (BMI) 24.0-24.9, adult: Secondary | ICD-10-CM | POA: Diagnosis not present

## 2016-12-23 DIAGNOSIS — L299 Pruritus, unspecified: Secondary | ICD-10-CM | POA: Diagnosis not present

## 2017-01-02 DIAGNOSIS — Z992 Dependence on renal dialysis: Secondary | ICD-10-CM | POA: Diagnosis not present

## 2017-01-02 DIAGNOSIS — N186 End stage renal disease: Secondary | ICD-10-CM | POA: Diagnosis not present

## 2017-01-03 ENCOUNTER — Ambulatory Visit: Payer: Medicare HMO | Admitting: Cardiovascular Disease

## 2017-01-04 DIAGNOSIS — K219 Gastro-esophageal reflux disease without esophagitis: Secondary | ICD-10-CM | POA: Diagnosis not present

## 2017-01-04 DIAGNOSIS — I10 Essential (primary) hypertension: Secondary | ICD-10-CM | POA: Diagnosis not present

## 2017-01-04 DIAGNOSIS — N189 Chronic kidney disease, unspecified: Secondary | ICD-10-CM | POA: Diagnosis not present

## 2017-01-07 DIAGNOSIS — Z992 Dependence on renal dialysis: Secondary | ICD-10-CM | POA: Diagnosis not present

## 2017-01-07 DIAGNOSIS — N186 End stage renal disease: Secondary | ICD-10-CM | POA: Diagnosis not present

## 2017-01-08 DIAGNOSIS — N186 End stage renal disease: Secondary | ICD-10-CM | POA: Diagnosis not present

## 2017-01-08 DIAGNOSIS — Z992 Dependence on renal dialysis: Secondary | ICD-10-CM | POA: Diagnosis not present

## 2017-01-09 DIAGNOSIS — Z992 Dependence on renal dialysis: Secondary | ICD-10-CM | POA: Diagnosis not present

## 2017-01-09 DIAGNOSIS — N186 End stage renal disease: Secondary | ICD-10-CM | POA: Diagnosis not present

## 2017-01-11 DIAGNOSIS — N186 End stage renal disease: Secondary | ICD-10-CM | POA: Diagnosis not present

## 2017-01-11 DIAGNOSIS — Z992 Dependence on renal dialysis: Secondary | ICD-10-CM | POA: Diagnosis not present

## 2017-01-13 DIAGNOSIS — N186 End stage renal disease: Secondary | ICD-10-CM | POA: Diagnosis not present

## 2017-01-13 DIAGNOSIS — Z992 Dependence on renal dialysis: Secondary | ICD-10-CM | POA: Diagnosis not present

## 2017-01-16 DIAGNOSIS — N186 End stage renal disease: Secondary | ICD-10-CM | POA: Diagnosis not present

## 2017-01-16 DIAGNOSIS — Z992 Dependence on renal dialysis: Secondary | ICD-10-CM | POA: Diagnosis not present

## 2017-01-17 DIAGNOSIS — Z992 Dependence on renal dialysis: Secondary | ICD-10-CM | POA: Diagnosis not present

## 2017-01-17 DIAGNOSIS — E8770 Fluid overload, unspecified: Secondary | ICD-10-CM | POA: Diagnosis not present

## 2017-01-17 DIAGNOSIS — N186 End stage renal disease: Secondary | ICD-10-CM | POA: Diagnosis not present

## 2017-01-18 DIAGNOSIS — N186 End stage renal disease: Secondary | ICD-10-CM | POA: Diagnosis not present

## 2017-01-18 DIAGNOSIS — Z992 Dependence on renal dialysis: Secondary | ICD-10-CM | POA: Diagnosis not present

## 2017-01-19 ENCOUNTER — Encounter (INDEPENDENT_AMBULATORY_CARE_PROVIDER_SITE_OTHER): Payer: Self-pay

## 2017-01-19 ENCOUNTER — Ambulatory Visit (INDEPENDENT_AMBULATORY_CARE_PROVIDER_SITE_OTHER): Payer: Medicare HMO | Admitting: Vascular Surgery

## 2017-01-19 ENCOUNTER — Encounter (INDEPENDENT_AMBULATORY_CARE_PROVIDER_SITE_OTHER): Payer: Self-pay | Admitting: Vascular Surgery

## 2017-01-19 VITALS — BP 156/79 | HR 77 | Resp 16 | Wt 128.0 lb

## 2017-01-19 DIAGNOSIS — I10 Essential (primary) hypertension: Secondary | ICD-10-CM | POA: Diagnosis not present

## 2017-01-19 DIAGNOSIS — N186 End stage renal disease: Secondary | ICD-10-CM | POA: Diagnosis not present

## 2017-01-19 DIAGNOSIS — I2510 Atherosclerotic heart disease of native coronary artery without angina pectoris: Secondary | ICD-10-CM | POA: Diagnosis not present

## 2017-01-19 DIAGNOSIS — E7800 Pure hypercholesterolemia, unspecified: Secondary | ICD-10-CM | POA: Diagnosis not present

## 2017-01-19 DIAGNOSIS — T829XXD Unspecified complication of cardiac and vascular prosthetic device, implant and graft, subsequent encounter: Secondary | ICD-10-CM

## 2017-01-19 IMAGING — US US EXTREM  UP VENOUS*R*
2 series · 13 of 24 positions shown · non-contrast
Comparison: None.

CLINICAL DATA: Recent placement of a tunneled catheter. Right upper
extremity swelling.

EXAM:
RIGHT UPPER EXTREMITY VENOUS DOPPLER ULTRASOUND
TECHNIQUE: Gray-scale sonography with graded compression, as well as color
Doppler and duplex ultrasound were performed to evaluate the upper
extremity deep venous system from the level of the subclavian vein
and including the jugular, axillary, basilic and upper cephalic
vein. Spectral Doppler was utilized to evaluate flow at rest and
with distal augmentation maneuvers.

[Series 1: us extrem up venous*right* · 0.08mm/px · 10 of 76 slices shown (1 of 2)]
[im 1/76]
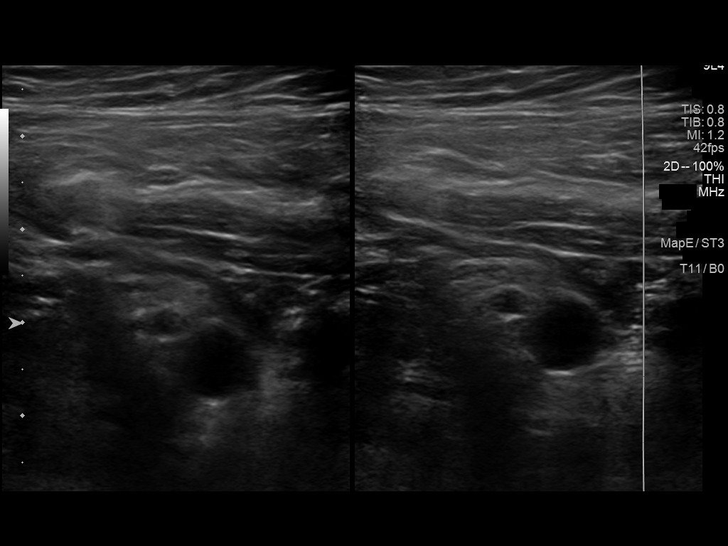
[im 9/76]
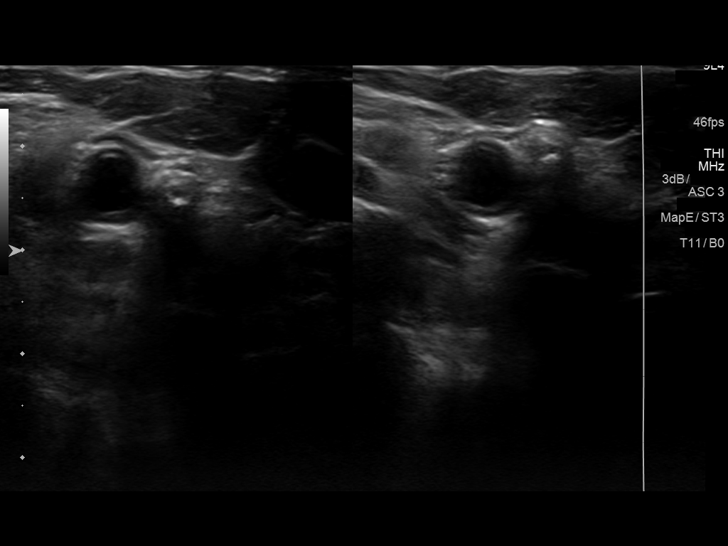
[im 17/76]
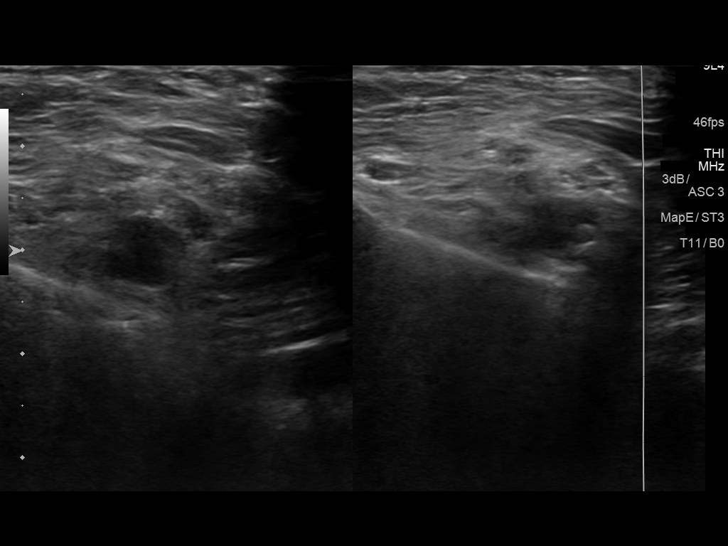
[im 26/76]
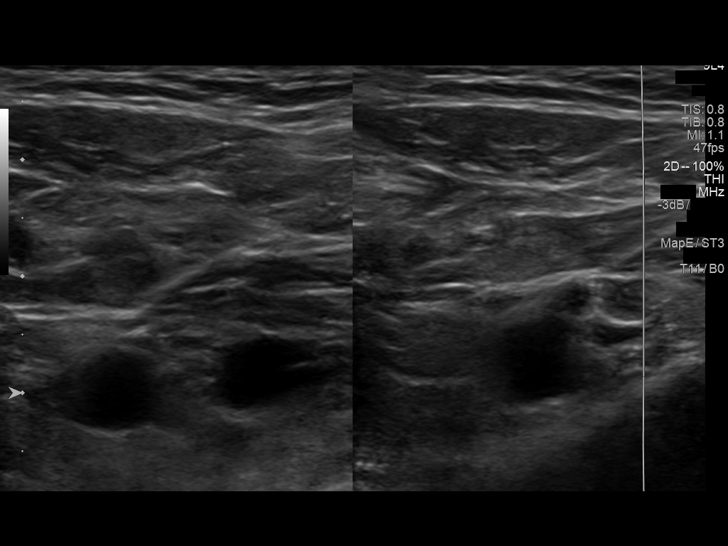
[im 34/76]
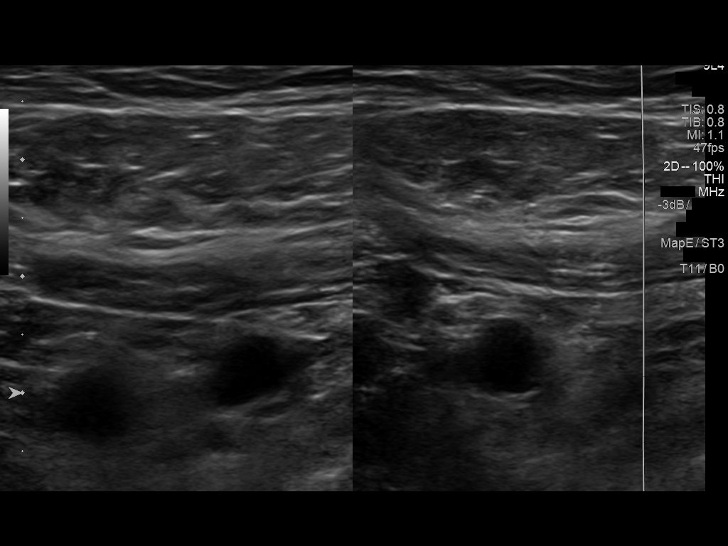
[im 42/76]
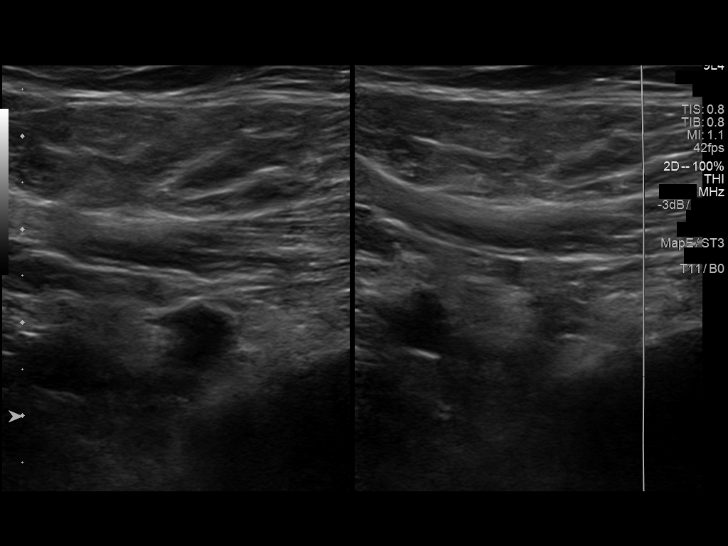
[im 51/76]
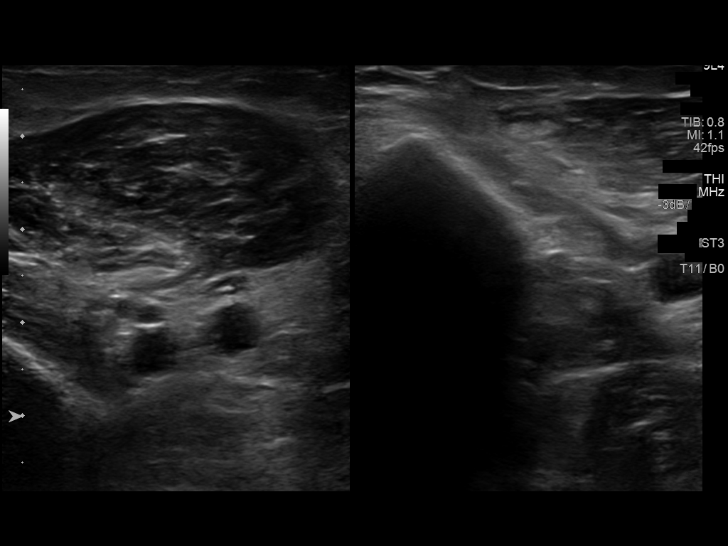
[im 55/76]
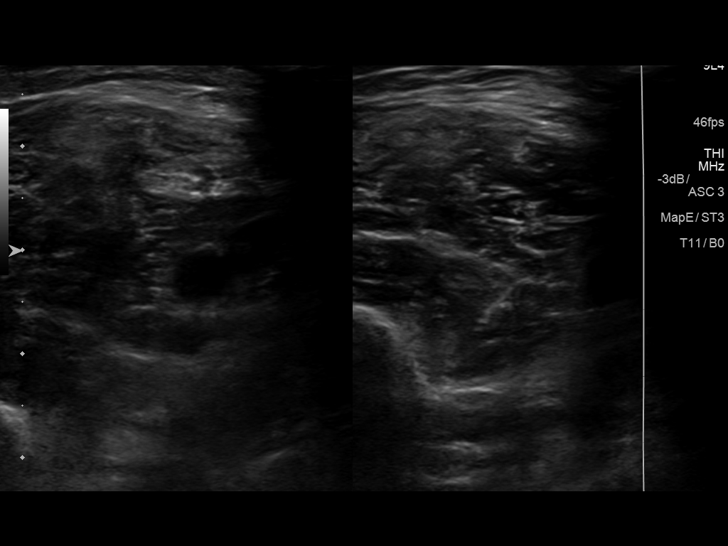
[im 63/76]
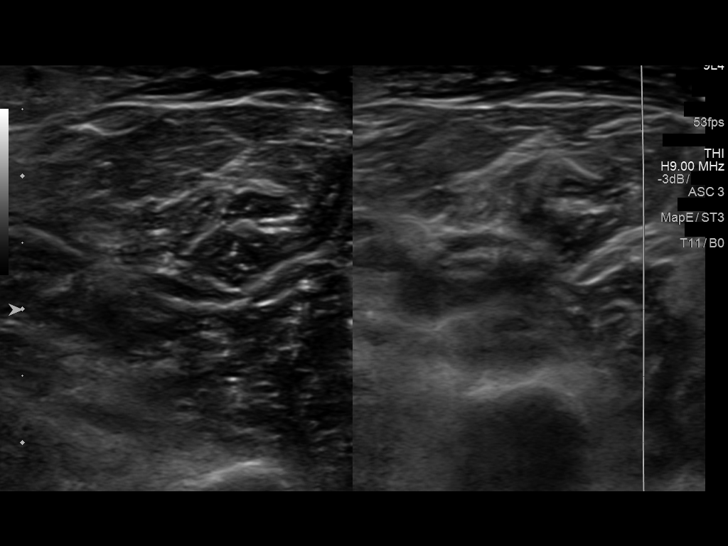
[im 71/76]
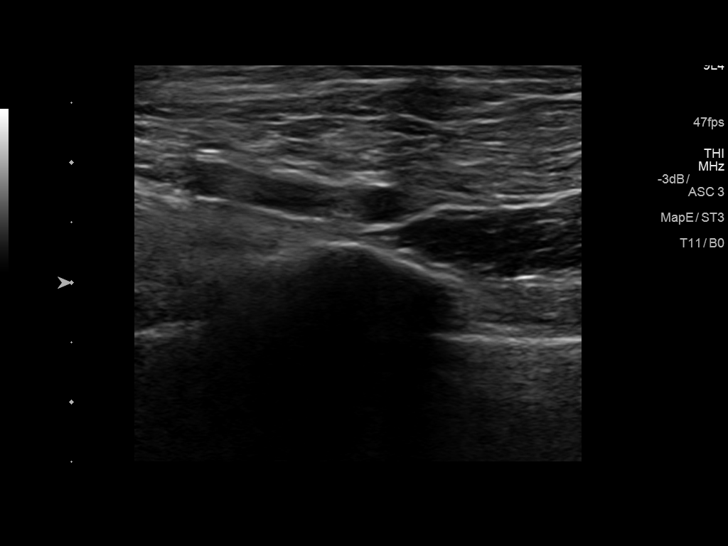

[Series 2: us extrem up venous*right* · 0.05mm/px · 3 of 18 slices shown (2 of 2)]
[im 1/18]
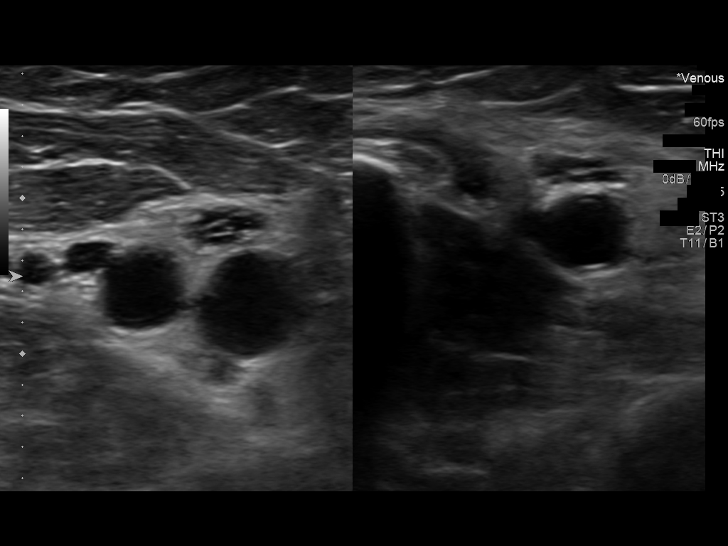
[im 9/18]
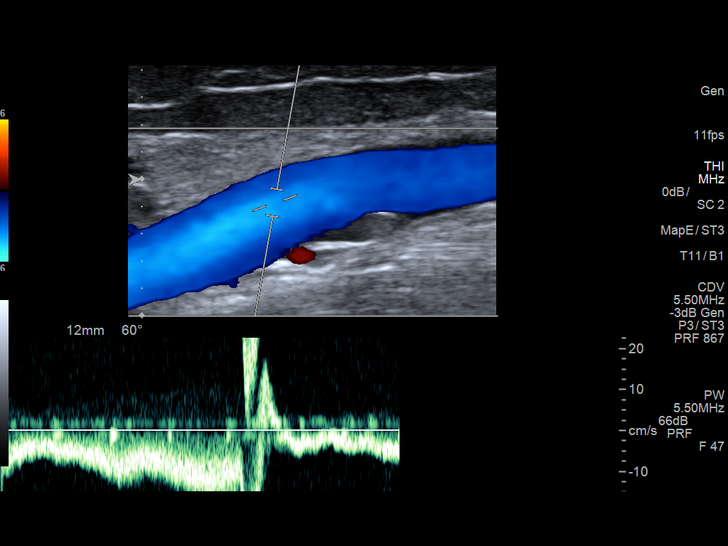
[im 18/18]
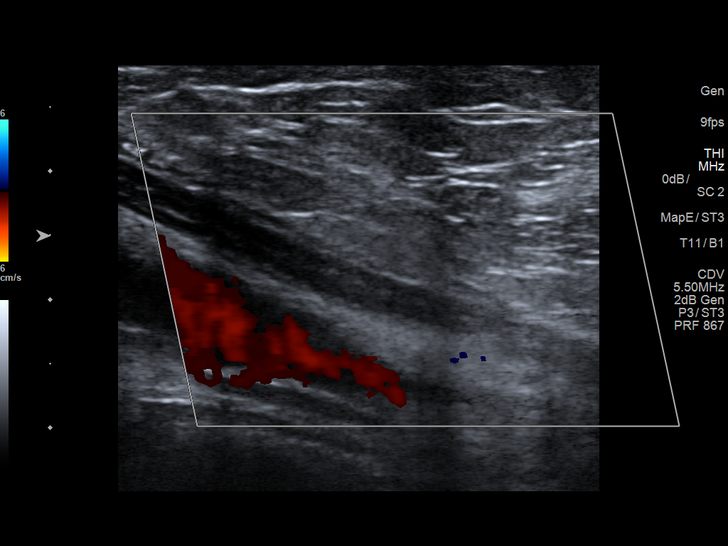

[13 of 24 positions shown; findings below may reference images not displayed]

FINDINGS: Normal color Doppler flow and phasicity in the left subclavian vein.
There appears to an echogenic structure in the right internal
jugular vein compatible with the tunneled right jugular central
venous catheter. The right internal jugular vein does appear to be
patent with phasicity. There is compressibility, color Doppler flow
and phasicity in the right subclavian vein. Normal compressibility,
color Doppler flow and augmentation in the right axillary vein.
Normal compressibility, color Doppler flow and augmentation in the
right brachial veins. Radial veins appear to be patent. Ulnar veins
are poorly visualized. Right cephalic vein demonstrates normal
compressibility and color Doppler flow.

The right basilic vein is decompressed and there are linear echoes
extending along the brachial vein which could represent an
intravenous catheter or related to a prior PICC line placement.
There is no flow within the right basilic vein.

Other findings:  None visualized.
IMPRESSION: No evidence for deep venous thrombosis in the right upper extremity.

Thrombosis of the right basilic vein which is a superficial vein. In
addition, there is evidence for either a prior catheter in this
thrombosed vein or there is currently a catheter in this right
basilic vein. Recommend clinical correlation.

## 2017-01-20 ENCOUNTER — Encounter (HOSPITAL_COMMUNITY): Payer: Self-pay | Admitting: Emergency Medicine

## 2017-01-20 ENCOUNTER — Observation Stay (HOSPITAL_COMMUNITY)
Admission: EM | Admit: 2017-01-20 | Discharge: 2017-01-21 | Disposition: A | Discharge status: Home / Self Care | Payer: Medicare HMO | Attending: Internal Medicine | Admitting: Internal Medicine

## 2017-01-20 ENCOUNTER — Emergency Department (HOSPITAL_COMMUNITY): Payer: Medicare HMO

## 2017-01-20 ENCOUNTER — Other Ambulatory Visit: Payer: Self-pay

## 2017-01-20 DIAGNOSIS — Z8543 Personal history of malignant neoplasm of ovary: Secondary | ICD-10-CM | POA: Diagnosis not present

## 2017-01-20 DIAGNOSIS — I10 Essential (primary) hypertension: Secondary | ICD-10-CM | POA: Diagnosis present

## 2017-01-20 DIAGNOSIS — Z992 Dependence on renal dialysis: Secondary | ICD-10-CM | POA: Diagnosis not present

## 2017-01-20 DIAGNOSIS — Z955 Presence of coronary angioplasty implant and graft: Secondary | ICD-10-CM | POA: Insufficient documentation

## 2017-01-20 DIAGNOSIS — I2510 Atherosclerotic heart disease of native coronary artery without angina pectoris: Secondary | ICD-10-CM | POA: Diagnosis present

## 2017-01-20 DIAGNOSIS — N186 End stage renal disease: Secondary | ICD-10-CM | POA: Insufficient documentation

## 2017-01-20 DIAGNOSIS — M109 Gout, unspecified: Secondary | ICD-10-CM | POA: Diagnosis not present

## 2017-01-20 DIAGNOSIS — Z85038 Personal history of other malignant neoplasm of large intestine: Secondary | ICD-10-CM | POA: Insufficient documentation

## 2017-01-20 DIAGNOSIS — R079 Chest pain, unspecified: Secondary | ICD-10-CM

## 2017-01-20 DIAGNOSIS — Z880 Allergy status to penicillin: Secondary | ICD-10-CM | POA: Insufficient documentation

## 2017-01-20 DIAGNOSIS — R0789 Other chest pain: Secondary | ICD-10-CM | POA: Diagnosis not present

## 2017-01-20 DIAGNOSIS — Z882 Allergy status to sulfonamides status: Secondary | ICD-10-CM | POA: Insufficient documentation

## 2017-01-20 DIAGNOSIS — Z79899 Other long term (current) drug therapy: Secondary | ICD-10-CM | POA: Insufficient documentation

## 2017-01-20 DIAGNOSIS — Z7982 Long term (current) use of aspirin: Secondary | ICD-10-CM | POA: Insufficient documentation

## 2017-01-20 DIAGNOSIS — I160 Hypertensive urgency: Secondary | ICD-10-CM | POA: Diagnosis not present

## 2017-01-20 DIAGNOSIS — I132 Hypertensive heart and chronic kidney disease with heart failure and with stage 5 chronic kidney disease, or end stage renal disease: Secondary | ICD-10-CM | POA: Diagnosis not present

## 2017-01-20 DIAGNOSIS — F419 Anxiety disorder, unspecified: Secondary | ICD-10-CM | POA: Diagnosis not present

## 2017-01-20 DIAGNOSIS — K219 Gastro-esophageal reflux disease without esophagitis: Secondary | ICD-10-CM | POA: Diagnosis not present

## 2017-01-20 DIAGNOSIS — I700 Atherosclerosis of aorta: Secondary | ICD-10-CM | POA: Diagnosis not present

## 2017-01-20 DIAGNOSIS — Z951 Presence of aortocoronary bypass graft: Secondary | ICD-10-CM | POA: Insufficient documentation

## 2017-01-20 DIAGNOSIS — E7800 Pure hypercholesterolemia, unspecified: Secondary | ICD-10-CM | POA: Insufficient documentation

## 2017-01-20 DIAGNOSIS — N2581 Secondary hyperparathyroidism of renal origin: Secondary | ICD-10-CM | POA: Insufficient documentation

## 2017-01-20 DIAGNOSIS — D631 Anemia in chronic kidney disease: Secondary | ICD-10-CM | POA: Insufficient documentation

## 2017-01-20 DIAGNOSIS — I5033 Acute on chronic diastolic (congestive) heart failure: Secondary | ICD-10-CM | POA: Diagnosis not present

## 2017-01-20 DIAGNOSIS — I471 Supraventricular tachycardia: Secondary | ICD-10-CM | POA: Insufficient documentation

## 2017-01-20 DIAGNOSIS — I340 Nonrheumatic mitral (valve) insufficiency: Secondary | ICD-10-CM | POA: Diagnosis not present

## 2017-01-20 DIAGNOSIS — Z8673 Personal history of transient ischemic attack (TIA), and cerebral infarction without residual deficits: Secondary | ICD-10-CM | POA: Diagnosis not present

## 2017-01-20 DIAGNOSIS — I252 Old myocardial infarction: Secondary | ICD-10-CM | POA: Diagnosis not present

## 2017-01-20 DIAGNOSIS — Z8711 Personal history of peptic ulcer disease: Secondary | ICD-10-CM | POA: Insufficient documentation

## 2017-01-20 LAB — BASIC METABOLIC PANEL
Anion gap: 12 (ref 5–15)
BUN: 49 mg/dL — AB (ref 6–20)
CO2: 25 mmol/L (ref 22–32)
CREATININE: 7.53 mg/dL — AB (ref 0.44–1.00)
Calcium: 9.1 mg/dL (ref 8.9–10.3)
Chloride: 99 mmol/L — ABNORMAL LOW (ref 101–111)
GFR, EST AFRICAN AMERICAN: 5 mL/min — AB (ref >60)
GFR, EST NON AFRICAN AMERICAN: 5 mL/min — AB (ref >60)
Glucose, Bld: 103 mg/dL — ABNORMAL HIGH (ref 65–99)
Potassium: 4.3 mmol/L (ref 3.5–5.1)
SODIUM: 136 mmol/L (ref 135–145)

## 2017-01-20 LAB — I-STAT TROPONIN, ED: TROPONIN I, POC: 0.02 ng/mL (ref 0.00–0.08)

## 2017-01-20 LAB — CBC
HCT: 37.4 % (ref 36.0–46.0)
Hemoglobin: 11.7 g/dL — ABNORMAL LOW (ref 12.0–15.0)
MCH: 31.4 pg (ref 26.0–34.0)
MCHC: 31.3 g/dL (ref 30.0–36.0)
MCV: 100.3 fL — ABNORMAL HIGH (ref 78.0–100.0)
PLATELETS: 302 10*3/uL (ref 150–400)
RBC: 3.73 MIL/uL — AB (ref 3.87–5.11)
RDW: 15.8 % — AB (ref 11.5–15.5)
WBC: 8 10*3/uL (ref 4.0–10.5)

## 2017-01-20 LAB — TROPONIN I
Troponin I: 0.05 ng/mL (ref <0.03)
Troponin I: 0.04 ng/mL (ref <0.03)

## 2017-01-20 IMAGING — DX DG CHEST 2V
2 series · 2 of 2 positions shown · non-contrast
Comparison: Chest radiograph dated [DATE]

CLINICAL DATA: 75-year-old female with chest pain and palpitation.

EXAM:
CHEST  2 VIEW

[w chest pa]
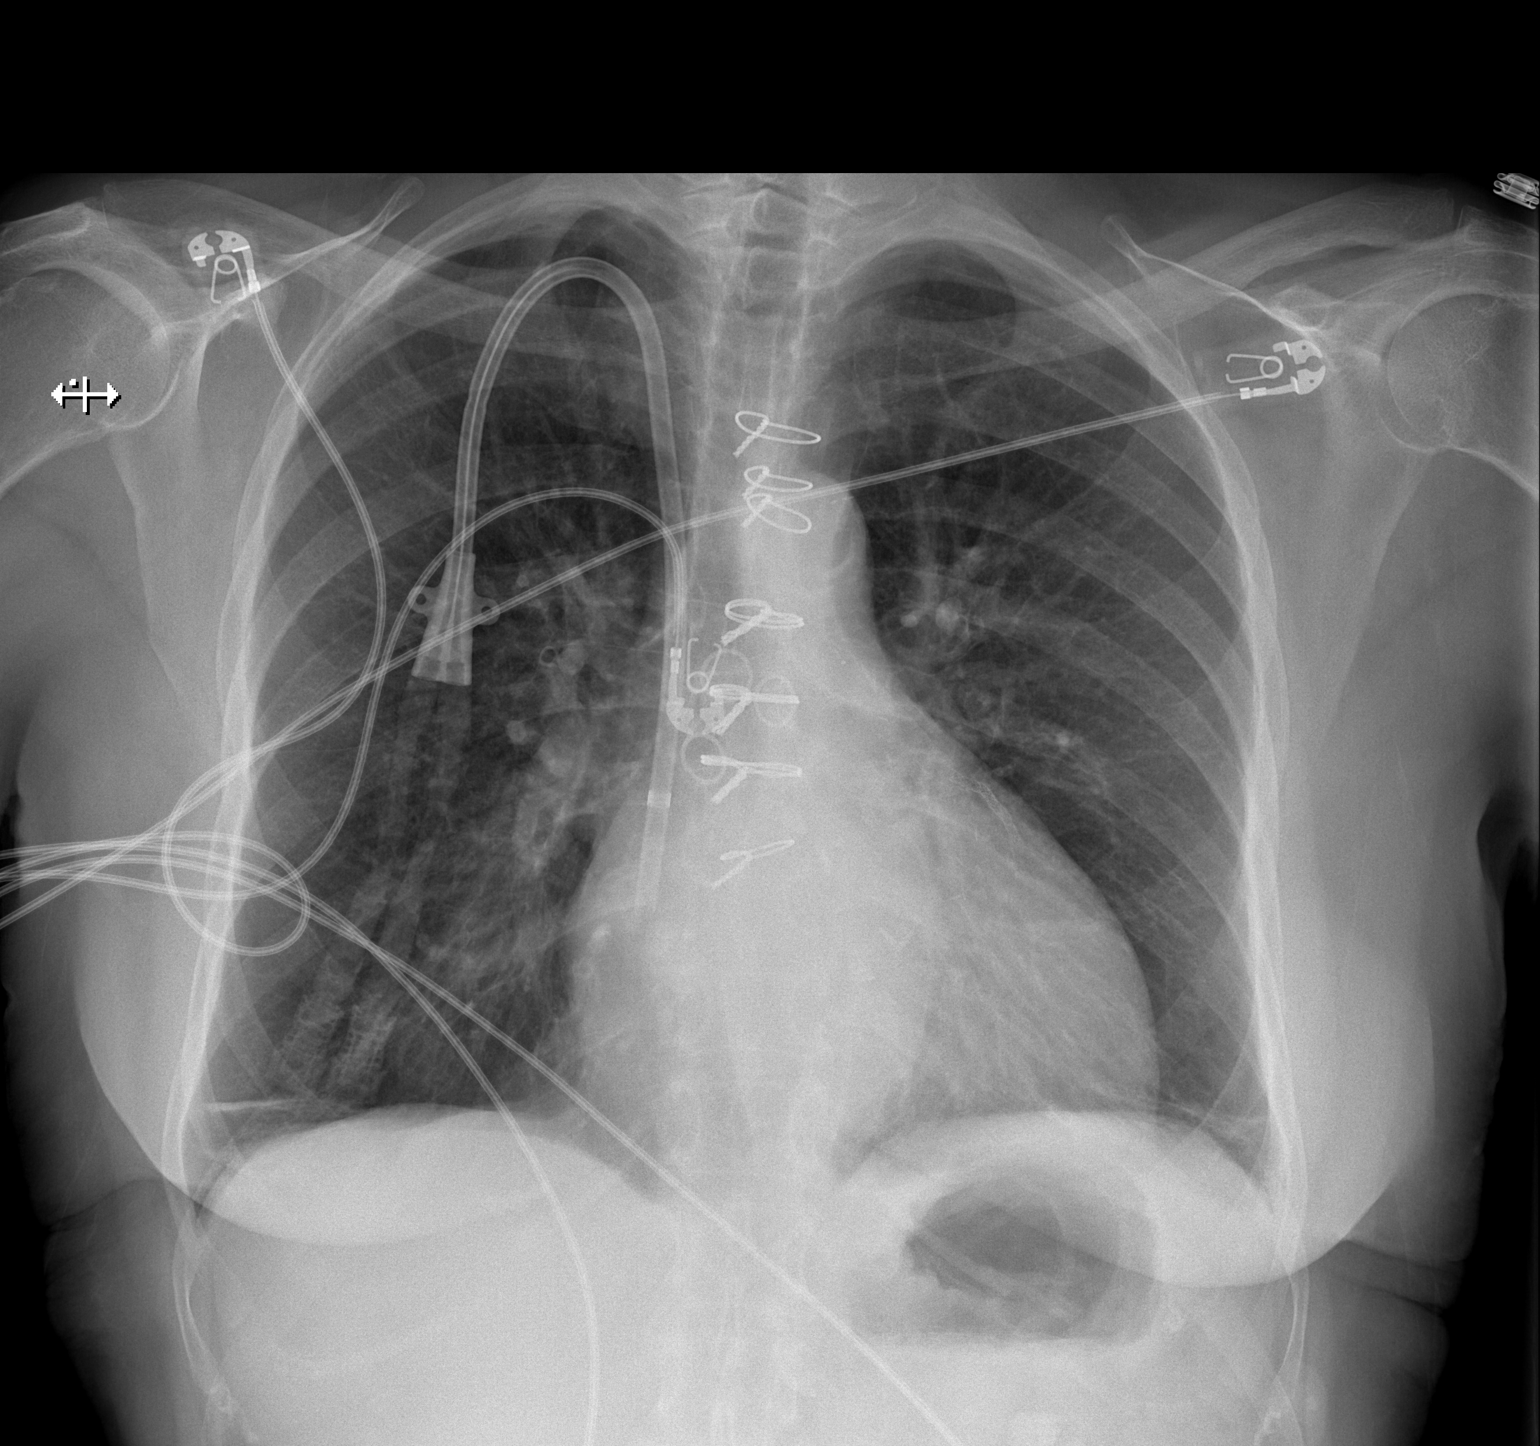

[w chest lat]
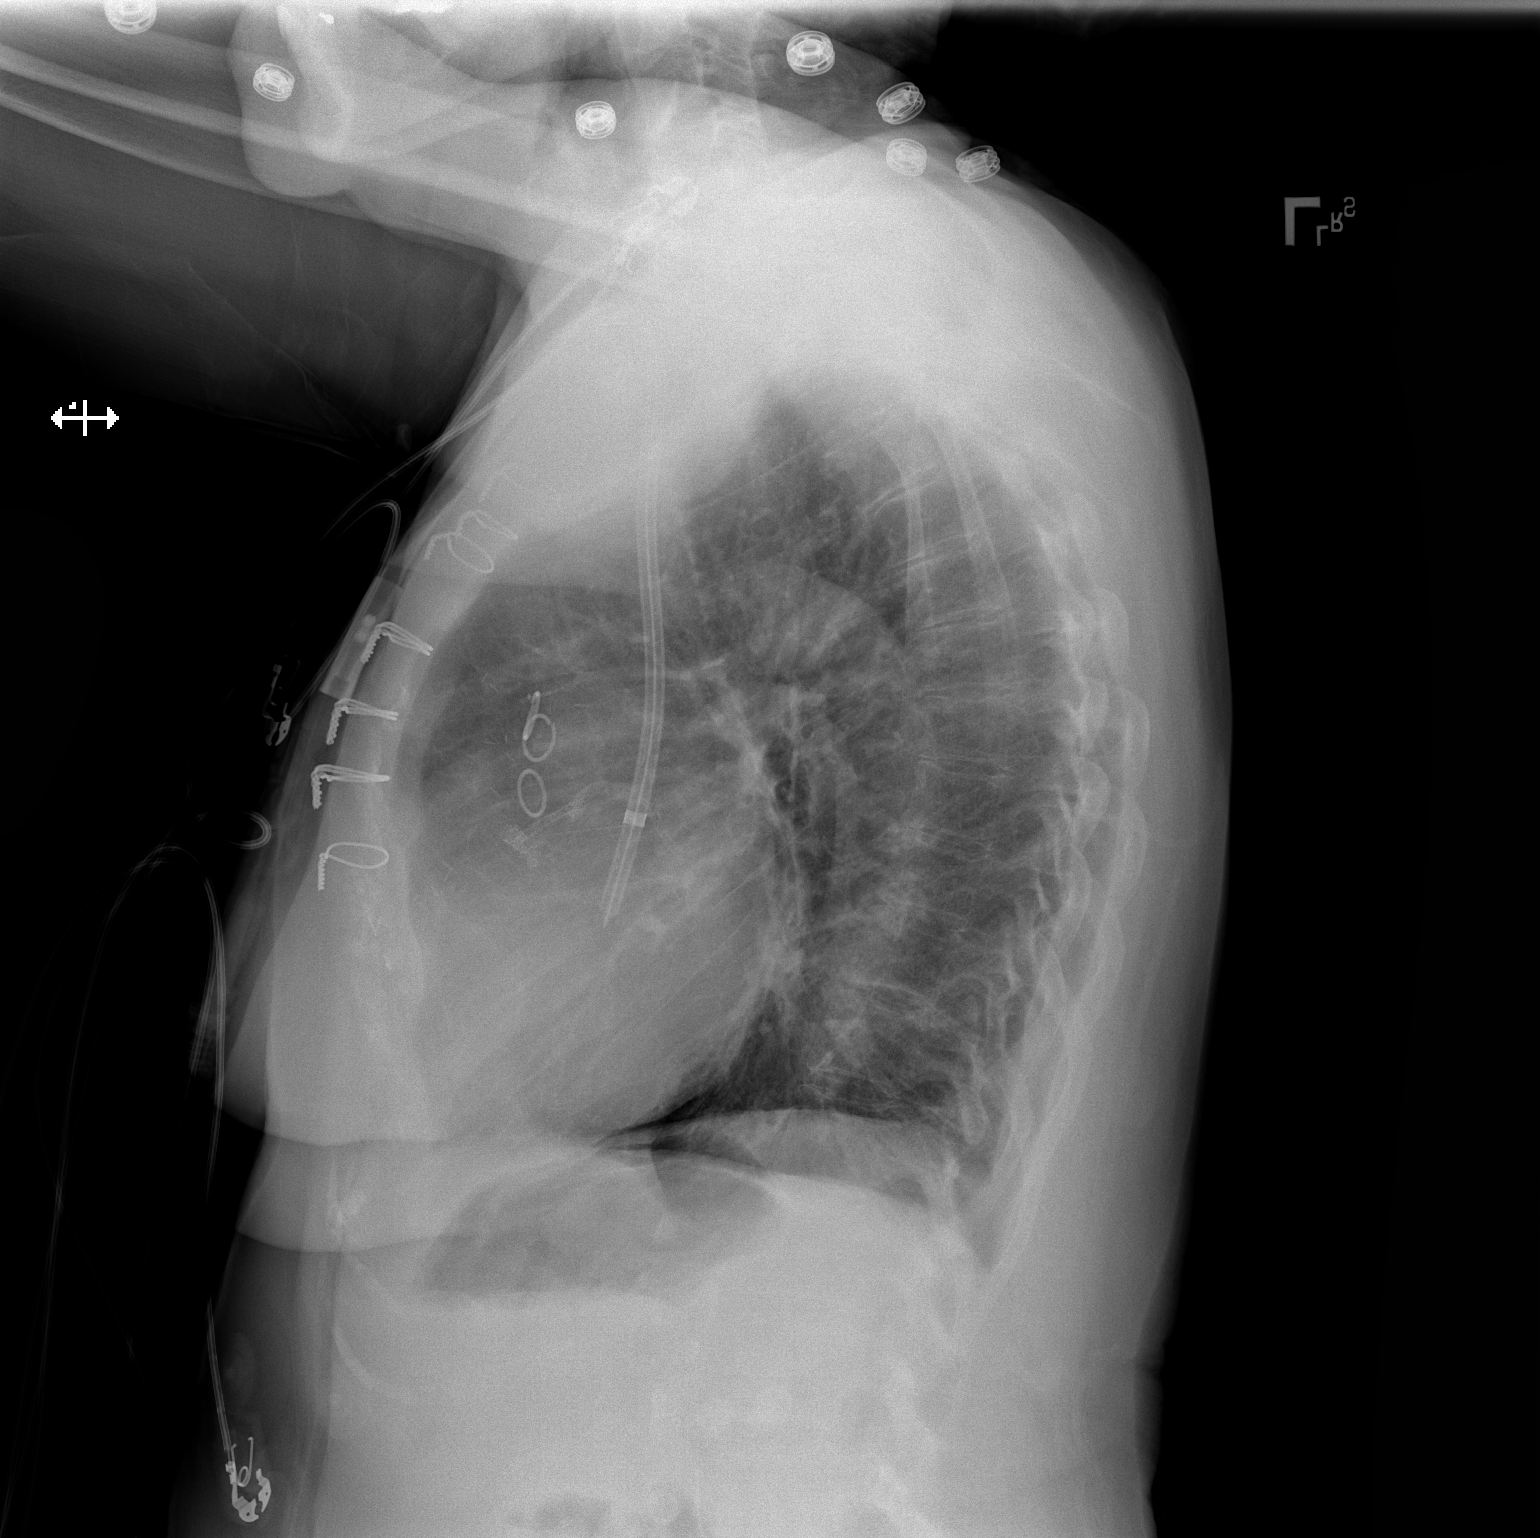

[2 of 2 positions shown; findings below may reference images not displayed]

FINDINGS: Right-sided dialysis catheter with tip over the right atrium in
stable positioning. The cardiac silhouette is within normal limits.
Median sternotomy wires and postsurgical changes of CABG noted.
There minimal bibasilar linear atelectasis/ scarring. No focal
consolidation, pleural effusion, or pneumothorax. Overall
improvement of the aeration of the lungs compared to prior
radiograph. There is atherosclerotic calcification of the abdominal
aorta and splenic artery. No acute osseous pathology.
IMPRESSION: No active cardiopulmonary disease.

## 2017-01-20 MED ORDER — CINACALCET HCL 30 MG PO TABS
30.0000 mg | ORAL_TABLET | Freq: Every day | ORAL | Status: DC
  Administered 2017-01-20: 30 mg via ORAL
  Filled 2017-01-20: qty 1

## 2017-01-20 MED ORDER — MORPHINE SULFATE (PF) 4 MG/ML IV SOLN: 2.0000 mg | INTRAVENOUS | Status: DC | PRN

## 2017-01-20 MED ORDER — ONDANSETRON HCL 4 MG/2ML IJ SOLN: 4.0000 mg | Freq: Four times a day (QID) | INTRAMUSCULAR | Status: DC | PRN

## 2017-01-20 MED ORDER — CARVEDILOL 6.25 MG PO TABS
6.2500 mg | ORAL_TABLET | Freq: Two times a day (BID) | ORAL | Status: DC
  Administered 2017-01-20 – 2017-01-21 (×3): 6.25 mg via ORAL
  Filled 2017-01-20 (×3): qty 1

## 2017-01-20 MED ORDER — AMLODIPINE BESYLATE 5 MG PO TABS: 2.5000 mg | ORAL_TABLET | Freq: Every day | ORAL | Status: DC

## 2017-01-20 MED ORDER — HEPARIN SODIUM (PORCINE) 1000 UNIT/ML DIALYSIS: 1000.0000 [IU] | INTRAMUSCULAR | Status: DC | PRN

## 2017-01-20 MED ORDER — RENA-VITE PO TABS
1.0000 | ORAL_TABLET | Freq: Every day | ORAL | Status: DC
  Administered 2017-01-20 – 2017-01-21 (×2): 1 via ORAL
  Filled 2017-01-20 (×2): qty 1

## 2017-01-20 MED ORDER — HYDRALAZINE HCL 20 MG/ML IJ SOLN
5.0000 mg | INTRAMUSCULAR | Status: DC | PRN
  Administered 2017-01-20: 5 mg via INTRAVENOUS
  Filled 2017-01-20: qty 1

## 2017-01-20 MED ORDER — ONDANSETRON HCL 4 MG PO TABS
4.0000 mg | ORAL_TABLET | Freq: Four times a day (QID) | ORAL | Status: DC | PRN
Start: 2017-01-20 — End: 2017-01-21

## 2017-01-20 MED ORDER — ALTEPLASE 2 MG IJ SOLR: 2.0000 mg | Freq: Once | INTRAMUSCULAR | Status: DC | PRN

## 2017-01-20 MED ORDER — LIDOCAINE HCL (PF) 1 % IJ SOLN: 5.0000 mL | INTRAMUSCULAR | Status: DC | PRN

## 2017-01-20 MED ORDER — CLONIDINE HCL 0.2 MG PO TABS
0.2000 mg | ORAL_TABLET | Freq: Three times a day (TID) | ORAL | Status: DC
  Administered 2017-01-20 – 2017-01-21 (×2): 0.2 mg via ORAL
  Filled 2017-01-20 (×3): qty 1

## 2017-01-20 MED ORDER — HEPARIN SODIUM (PORCINE) 1000 UNIT/ML DIALYSIS: 20.0000 [IU]/kg | INTRAMUSCULAR | Status: DC | PRN

## 2017-01-20 MED ORDER — HYDRALAZINE HCL 50 MG PO TABS
50.0000 mg | ORAL_TABLET | Freq: Three times a day (TID) | ORAL | Status: DC
  Administered 2017-01-20: 50 mg via ORAL
  Filled 2017-01-20 (×2): qty 1

## 2017-01-20 MED ORDER — HEPARIN SODIUM (PORCINE) 5000 UNIT/ML IJ SOLN
5000.0000 [IU] | Freq: Three times a day (TID) | INTRAMUSCULAR | Status: DC
  Administered 2017-01-20: 5000 [IU] via SUBCUTANEOUS
  Filled 2017-01-20 (×2): qty 1

## 2017-01-20 MED ORDER — NITROGLYCERIN 0.4 MG SL SUBL
0.4000 mg | SUBLINGUAL_TABLET | SUBLINGUAL | Status: AC | PRN
  Administered 2017-01-20 (×3): 0.4 mg via SUBLINGUAL
  Filled 2017-01-20 (×2): qty 1

## 2017-01-20 MED ORDER — LIDOCAINE-PRILOCAINE 2.5-2.5 % EX CREA: 1.0000 "application " | TOPICAL_CREAM | CUTANEOUS | Status: DC | PRN

## 2017-01-20 MED ORDER — AMIODARONE HCL 200 MG PO TABS
200.0000 mg | ORAL_TABLET | Freq: Every day | ORAL | Status: DC
  Administered 2017-01-20 – 2017-01-21 (×2): 200 mg via ORAL
  Filled 2017-01-20 (×2): qty 1

## 2017-01-20 MED ORDER — ACETAMINOPHEN 650 MG RE SUPP: 650.0000 mg | Freq: Four times a day (QID) | RECTAL | Status: DC | PRN

## 2017-01-20 MED ORDER — TICAGRELOR 90 MG PO TABS
90.0000 mg | ORAL_TABLET | Freq: Two times a day (BID) | ORAL | Status: DC
  Administered 2017-01-20 – 2017-01-21 (×3): 90 mg via ORAL
  Filled 2017-01-20 (×3): qty 1

## 2017-01-20 MED ORDER — DOXERCALCIFEROL 4 MCG/2ML IV SOLN
3.0000 ug | INTRAVENOUS | Status: DC
  Administered 2017-01-20: 3 ug via INTRAVENOUS
  Filled 2017-01-20: qty 2

## 2017-01-20 MED ORDER — ASPIRIN EC 81 MG PO TBEC
81.0000 mg | DELAYED_RELEASE_TABLET | Freq: Every day | ORAL | Status: DC
  Administered 2017-01-21: 81 mg via ORAL
  Filled 2017-01-20 (×2): qty 1

## 2017-01-20 MED ORDER — ONDANSETRON 4 MG PO TBDP
4.0000 mg | ORAL_TABLET | Freq: Two times a day (BID) | ORAL | Status: DC | PRN
Start: 2017-01-20 — End: 2017-01-20

## 2017-01-20 MED ORDER — ASPIRIN 81 MG PO CHEW
243.0000 mg | CHEWABLE_TABLET | Freq: Once | ORAL | Status: AC
  Administered 2017-01-20: 243 mg via ORAL

## 2017-01-20 MED ORDER — ISOSORBIDE MONONITRATE ER 60 MG PO TB24
120.0000 mg | ORAL_TABLET | Freq: Every day | ORAL | Status: DC
  Administered 2017-01-20 – 2017-01-21 (×2): 120 mg via ORAL
  Filled 2017-01-20 (×2): qty 2

## 2017-01-20 MED ORDER — PANTOPRAZOLE SODIUM 40 MG PO TBEC
40.0000 mg | DELAYED_RELEASE_TABLET | Freq: Every day | ORAL | Status: DC
  Administered 2017-01-20 – 2017-01-21 (×2): 40 mg via ORAL
  Filled 2017-01-20 (×2): qty 1

## 2017-01-20 MED ORDER — ENOXAPARIN SODIUM 40 MG/0.4ML ~~LOC~~ SOLN: 40.0000 mg | SUBCUTANEOUS | Status: DC

## 2017-01-20 MED ORDER — FLUTICASONE PROPIONATE 50 MCG/ACT NA SUSP
2.0000 | Freq: Every day | NASAL | Status: DC
  Administered 2017-01-20: 2 via NASAL
  Filled 2017-01-20: qty 16

## 2017-01-20 MED ORDER — LORATADINE 10 MG PO TABS
10.0000 mg | ORAL_TABLET | Freq: Every day | ORAL | Status: DC
  Administered 2017-01-20 – 2017-01-21 (×2): 10 mg via ORAL
  Filled 2017-01-20 (×2): qty 1

## 2017-01-20 MED ORDER — SIMVASTATIN 20 MG PO TABS
20.0000 mg | ORAL_TABLET | Freq: Every day | ORAL | Status: DC
  Administered 2017-01-20: 20 mg via ORAL
  Filled 2017-01-20: qty 1

## 2017-01-20 MED ORDER — SODIUM CHLORIDE 0.9% FLUSH
3.0000 mL | Freq: Two times a day (BID) | INTRAVENOUS | Status: DC
  Administered 2017-01-20: 3 mL via INTRAVENOUS

## 2017-01-20 MED ORDER — ALPRAZOLAM 0.25 MG PO TABS
0.2500 mg | ORAL_TABLET | Freq: Two times a day (BID) | ORAL | Status: DC | PRN
  Administered 2017-01-20: 0.25 mg via ORAL
  Filled 2017-01-20: qty 1

## 2017-01-20 MED ORDER — PENTAFLUOROPROP-TETRAFLUOROETH EX AERO: 1.0000 "application " | INHALATION_SPRAY | CUTANEOUS | Status: DC | PRN

## 2017-01-20 MED ORDER — NITROGLYCERIN 2 % TD OINT: 1.0000 [in_us] | TOPICAL_OINTMENT | Freq: Four times a day (QID) | TRANSDERMAL | Status: DC

## 2017-01-20 MED ORDER — SODIUM CHLORIDE 0.9 % IV SOLN: 100.0000 mL | INTRAVENOUS | Status: DC | PRN

## 2017-01-20 MED ORDER — ASPIRIN 81 MG PO CHEW
324.0000 mg | CHEWABLE_TABLET | Freq: Once | ORAL | Status: DC
  Filled 2017-01-20: qty 4

## 2017-01-20 MED ORDER — SEVELAMER CARBONATE 800 MG PO TABS
800.0000 mg | ORAL_TABLET | Freq: Three times a day (TID) | ORAL | Status: DC
  Administered 2017-01-20 – 2017-01-21 (×3): 800 mg via ORAL
  Filled 2017-01-20 (×3): qty 1

## 2017-01-20 MED ORDER — DOXERCALCIFEROL 4 MCG/2ML IV SOLN
INTRAVENOUS | Status: AC
  Filled 2017-01-20: qty 2

## 2017-01-20 MED ORDER — ACETAMINOPHEN 325 MG PO TABS: 650.0000 mg | ORAL_TABLET | Freq: Four times a day (QID) | ORAL | Status: DC | PRN

## 2017-01-20 NOTE — ED Notes (Signed)
ED Provider at bedside. 

## 2017-01-20 NOTE — Progress Notes (Signed)
Patient seen and examined this morning, admitted overnight by Dr. Alcario Drought.  In brief, this is a pleasant 76 year old female with history of coronary artery disease, CABG, recent stenting in April 2018, end-stage renal disease who presents with chest pain and hypertensive urgency with systolic blood pressures into the 220s, as she ran out of one of her BP meds, Imdur   Hypertensive urgency -Resume her home medications including hydralazine, blood pressure improved today however still on the high side [id number] systolic.  Her chest pain has resolved.  She has had mild troponin elevation which is likely demand ischemia.  End-stage renal disease -She underwent dialysis today, unfortunately she missed her blood pressure medications around lunchtime when she was on dialysis.  Coronary artery disease -Chest pain in the setting of elevated BP, now resolved with improvement in her BP.  Cardiac enzymes 0.02, 0.05, 0.04, essentially flat, not in a pattern consistent with ACS   Shawna [name] M. Cruzita Lederer, MD [facility] [phone]  If 7PM-7AM, please contact night-coverage [email or web address] Password [email or web address]

## 2017-01-20 NOTE — Progress Notes (Signed)
[facility] Renal Consultation Note    Indication for Consultation:  Management of ESRD/hemodialysis; anemia, hypertension/volume and secondary hyperparathyroidism PCP:  HPI: Shawna [name] is a 76 y.o. female Shawna [name] is a 76 y.o. female with ESRD on HD MWF at [facility]. PMH includes CAD s/p CABG with recent NSTEMI admission (09/22/16 discharge cath that admission showed high grade instent stenosis of the DES- PCI not done due to high rate of restenosis- imdur was started), hx GIB, CHF, HTN.  She is admitted under observation status for HTN urgency and chest pain. Of note has been out of Imdur for last week.  Vitals this am BP 157/68 HR 77. CXR unremarkable. Labs  Na 136 K 4.3 Glucose 103 WBC 8.0 Hgb 11.7. Troponin 0.05   Dialysis Orders:  [name] 3.75h  300/600 Max UFR 1.1L/hr 2K/2.5Ca  UF Profile #2 EDW 56.5kg RIJ TDC Heparin Bolus 2000  -Will place orders for HD today on schedule -Full consult if inpatient admission   [name] Larina Earthly [facility] [place] Pager [phone] 01/20/2017,9:28 AM

## 2017-01-20 NOTE — Progress Notes (Signed)
MRN : [id number]  Shawna [name] is a 76 y.o. (1941/04/26) female who presents with chief complaint of  Chief Complaint  Patient presents with  . Follow-up  .  History of Present Illness: The patient returns to the office for followup status post intervention of the dialysis access.  Following the intervention the excess function was unchanged per the patient.  The patient continues to be experiencing increased bleeding times following decannulation and increased recirculation with diminished efficiency of their dialysis. The patient denies an increase in arm swelling. At the present time the patient denies hand pain.  The patient denies amaurosis fugax or recent TIA symptoms. There are no recent neurological changes noted. The patient denies claudication symptoms or rest pain symptoms. The patient denies history of DVT, PE or superficial thrombophlebitis. The patient denies recent episodes of angina or shortness of breath.    Current Meds  Medication Sig  . ALPRAZolam (XANAX) 0.25 MG tablet Take 0.25 mg by mouth 2 (two) times daily as needed for anxiety or sleep.   Marland Kitchen amiodarone (PACERONE) 200 MG tablet Take 1 tablet (200 mg total) by mouth daily.  Marland Kitchen aspirin EC 81 MG tablet Take 81 mg by mouth daily.   . benzonatate (TESSALON) 100 MG capsule Take 1 capsule (100 mg total) by mouth every 8 (eight) hours. (Patient taking differently: Take 100 mg by mouth 3 (three) times daily as needed for cough. )  . carvedilol (COREG) 6.25 MG tablet Take 1 tablet (6.25 mg total) by mouth 2 (two) times daily with a meal.  . cinacalcet (SENSIPAR) 30 MG tablet Take 30 mg by mouth daily after supper.  . cloNIDine (CATAPRES) 0.2 MG tablet Take 1 tablet (0.2 mg total) by mouth 3 (three) times daily.  Marland Kitchen epoetin alfa (EPOGEN,PROCRIT) 05397 UNIT/ML injection Inject 1 mL (10,000 Units total) into the vein every Monday, Wednesday, and Friday with hemodialysis.  . fluticasone (FLONASE) 50 MCG/ACT nasal spray Place 2  sprays into the nose at bedtime.   . hydrALAZINE (APRESOLINE) 50 MG tablet Take 2 tablets (100 mg total) by mouth 3 (three) times daily. (Patient taking differently: Take 100 mg by mouth 3 (three) times daily as needed (for high blood pressure readings). )  . isosorbide mononitrate (IMDUR) 120 MG 24 hr tablet Take 1 tablet (120 mg total) by mouth daily.  Marland Kitchen loratadine (CLARITIN) 10 MG tablet Take 10 mg by mouth daily.   . multivitamin (RENA-VIT) TABS tablet Take 1 tablet by mouth daily.  . nitroGLYCERIN (NITROSTAT) 0.4 MG SL tablet Place 1 tablet (0.4 mg total) under the tongue every 5 (five) minutes as needed for chest pain.  Marland Kitchen omeprazole (PRILOSEC) 20 MG capsule Take 20 mg by mouth daily.   . ondansetron (ZOFRAN-ODT) 4 MG disintegrating tablet Take 4 mg by mouth 2 (two) times daily as needed for nausea or vomiting.  . simvastatin (ZOCOR) 20 MG tablet Take 20 mg by mouth at bedtime.  . ticagrelor (BRILINTA) 90 MG TABS tablet Take 90 mg by mouth 2 (two) times daily.  . [DISCONTINUED] atorvastatin (LIPITOR) 40 MG tablet Take 1 tablet (40 mg total) by mouth daily at 6 PM. (Patient not taking: Reported on 01/19/2017)  . [DISCONTINUED] bisacodyl (BISACODYL) 5 MG EC tablet Take 10 mg by mouth daily as needed for moderate constipation.  . [DISCONTINUED] bisacodyl (DULCOLAX) 10 MG suppository Place 10 mg rectally daily as needed for moderate constipation.   . [DISCONTINUED] lanthanum (FOSRENOL) 1000 MG chewable tablet Chew 500-1,000 mg  by mouth See admin instructions. Take 1000mg  three times a day with meals. Take 500mg s with snacks.    Past Medical History:  Diagnosis Date  . Acute on chronic respiratory failure with hypoxia ([place]) 10/10/2016  . Anxiety   . Arthritis   . AVM (arteriovenous malformation) of colon   . CAD (coronary artery disease)    a. 12/2011 NSTEMI/Cath/PCI LCX (2.25x14 Resolute DES) & D1 (2.25x22 Resolute DES);  b. 01/2012 Cath/PCI: LM 30, LAD 30p, 40-46m, D1 stent ok, 99 in sm branch  of diag, LCX patent stent, OM1 20, RCA 95 ost (4.0x12 Promus DES), EF 55%;  c. 04/2012 Lexi Cardiolite  EF 48%, small area of scar @ base/mid inflat wall with mild peri-infarct ischemia.; CABG 12/4  . Carotid artery disease ([place])    a. 58-85% LICA, 0/2774   . Chronic bronchitis ([place])   . Chronic diastolic CHF (congestive heart failure) ([place])    a. 02/2012 Echo EF 60-65%, nl wall motion, Gr 1 DD, mod MR  . Colon cancer ([place]) 1992  . Esophageal stricture   . ESRD on hemodialysis ([place])    ESRD due to HTN, started dialysis 2011 and gets HD at [facility] with Dr Hinda Lenis on MWF schedule.  Access is LUA AVF as of Sept 2014.   Marland Kitchen GERD (gastroesophageal reflux disease)   . High cholesterol 12/2011  . History of blood transfusion 07/2011; 12/2011; 01/2012 X 2; 04/2012  . History of gout   . History of lower GI bleeding   . Hypertension   . Iron deficiency anemia   . Mitral regurgitation    a. Moderate by echo, 02/2012  . Myocardial infarction ([place])   . Ovarian cancer ([place]) 1992  . Pneumonia ~ 2009  . PUD (peptic ulcer disease)   . TIA (transient ischemic attack)     Past Surgical History:  Procedure Laterality Date  . ABDOMINAL HYSTERECTOMY  1992  . APPENDECTOMY  06/1990  . AV FISTULA PLACEMENT  07/2009   left upper arm  . AV FISTULA PLACEMENT Right 09/06/2016   Procedure: RIGHT FOREARM ARTERIOVENOUS (AV) GRAFT;  Surgeon: Elam Dutch, MD;  Location: [place];  Service: Vascular;  Laterality: Right;  . [place] REMOVAL Right 09/06/2016   Procedure: REMOVAL OF Right Arm ARTERIOVENOUS GORETEX GRAFT and Vein Patch angioplasty of brachial artery;  Surgeon: Angelia Mould, MD;  Location: [place];  Service: Vascular;  Laterality: Right;  . COLON RESECTION  1992  . CORONARY ANGIOPLASTY WITH STENT PLACEMENT  12/15/11   "2"  . CORONARY ANGIOPLASTY WITH STENT PLACEMENT  y/2013   "1; makes total of 3" (05/02/2012)  . CORONARY ARTERY BYPASS GRAFT  06/13/2012   Procedure: CORONARY ARTERY BYPASS GRAFTING  (CABG);  Surgeon: Grace Isaac, MD;  Location: [place];  Service: Open Heart Surgery;  Laterality: N/A;  cabg x four;  using left internal mammary artery, and left leg greater saphenous vein harvested endoscopically  . CORONARY STENT INTERVENTION N/A 10/13/2016   Procedure: Coronary Stent Intervention;  Surgeon: Troy Sine, MD;  Location: [place] CV LAB;  Service: Cardiovascular;  Laterality: N/A;  . DILATION AND CURETTAGE OF UTERUS    . ESOPHAGOGASTRODUODENOSCOPY  01/20/2012   Procedure: ESOPHAGOGASTRODUODENOSCOPY (EGD);  Surgeon: Ladene Artist, MD,FACG;  Location: [facility] ENDOSCOPY;  Service: Endoscopy;  Laterality: N/A;  . ESOPHAGOGASTRODUODENOSCOPY N/A 03/26/2013   Procedure: ESOPHAGOGASTRODUODENOSCOPY (EGD);  Surgeon: Irene Shipper, MD;  Location: [facility] ENDOSCOPY;  Service: Endoscopy;  Laterality: N/A;  . ESOPHAGOGASTRODUODENOSCOPY N/A  04/30/2015   Procedure: ESOPHAGOGASTRODUODENOSCOPY (EGD);  Surgeon: Rogene Houston, MD;  [facility];  Service: Endoscopy;  Laterality: N/A;  1pm - moved to 10/20 @ 1:10  . ESOPHAGOGASTRODUODENOSCOPY N/A 07/29/2016   Procedure: ESOPHAGOGASTRODUODENOSCOPY (EGD);  Surgeon: Manus Gunning, MD;  Location: [place];  Service: Gastroenterology;  Laterality: N/A;  enteroscopy  . INTRAOPERATIVE TRANSESOPHAGEAL ECHOCARDIOGRAM  06/13/2012   Procedure: INTRAOPERATIVE TRANSESOPHAGEAL ECHOCARDIOGRAM;  Surgeon: Grace Isaac, MD;  Location: [place];  Service: Open Heart Surgery;  Laterality: N/A;  . IR GENERIC HISTORICAL  07/26/2016   IR FLUORO GUIDE CV LINE RIGHT 07/26/2016 Greggory Keen, MD [facility]  . IR GENERIC HISTORICAL  07/26/2016   IR US GUIDE VASC ACCESS RIGHT 07/26/2016 Greggory Keen, MD [facility]  . IR GENERIC HISTORICAL  08/02/2016   IR US GUIDE VASC ACCESS RIGHT 08/02/2016 Greggory Keen, MD [facility]  . IR GENERIC HISTORICAL  08/02/2016   IR FLUORO GUIDE CV LINE RIGHT 08/02/2016 Greggory Keen, MD [facility]  . LEFT HEART CATH  AND CORONARY ANGIOGRAPHY N/A 09/20/2016   Procedure: Left Heart Cath and Coronary Angiography;  Surgeon: [name], MD;  Location: [place] CV LAB;  Service: Cardiovascular;  Laterality: N/A;  . LEFT HEART CATH AND CORS/GRAFTS ANGIOGRAPHY N/A 10/13/2016   Procedure: Left Heart Cath and Cors/Grafts Angiography;  Surgeon: Troy Sine, MD;  Location: [place] CV LAB;  Service: Cardiovascular;  Laterality: N/A;  . LEFT HEART CATHETERIZATION WITH CORONARY ANGIOGRAM N/A 12/15/2011   Procedure: LEFT HEART CATHETERIZATION WITH CORONARY ANGIOGRAM;  Surgeon: Burnell Blanks, MD;  Location: [facility] CATH LAB;  Service: Cardiovascular;  Laterality: N/A;  . LEFT HEART CATHETERIZATION WITH CORONARY ANGIOGRAM N/A 01/10/2012   Procedure: LEFT HEART CATHETERIZATION WITH CORONARY ANGIOGRAM;  Surgeon: Peter M Martinique, MD;  Location: [facility] CATH LAB;  Service: Cardiovascular;  Laterality: N/A;  . LEFT HEART CATHETERIZATION WITH CORONARY ANGIOGRAM N/A 06/08/2012   Procedure: LEFT HEART CATHETERIZATION WITH CORONARY ANGIOGRAM;  Surgeon: Burnell Blanks, MD;  Location: [facility] CATH LAB;  Service: Cardiovascular;  Laterality: N/A;  . LEFT HEART CATHETERIZATION WITH CORONARY/GRAFT ANGIOGRAM N/A 12/10/2013   Procedure: LEFT HEART CATHETERIZATION WITH Beatrix Fetters;  Surgeon: [name], MD;  Location: [facility] CATH LAB;  Service: Cardiovascular;  Laterality: N/A;  . OVARY SURGERY     ovarian cancer  . SHUNTOGRAM N/A 10/15/2013   Procedure: Fistulogram;  Surgeon: Serafina Mitchell, MD;  Location: [facility] CATH LAB;  Service: Cardiovascular;  Laterality: N/A;  . THROMBECTOMY / ARTERIOVENOUS GRAFT REVISION  2011   left upper arm  . TUBAL LIGATION  1980's  . UPPER EXTREMITY ANGIOGRAPHY Bilateral 12/06/2016   Procedure: Upper Extremity Angiography;  Surgeon: Katha Cabal, MD;  Location: [place] CV LAB;  Service: Cardiovascular;  Laterality: Bilateral;    Social History Social History  Substance Use Topics   . Smoking status: Never Smoker  . Smokeless tobacco: Never Used  . Alcohol use No    Family History Family History  Problem Relation Age of Onset  . Heart disease Mother        Heart Disease before age 23  . Hyperlipidemia Mother   . Hypertension Mother   . Diabetes Mother   . Heart attack Mother   . Heart disease Father        Heart Disease before age 73  . Hyperlipidemia Father   . Hypertension Father   . Diabetes Father   . Diabetes Sister   . Hypertension  Sister   . Diabetes Brother   . Hyperlipidemia Brother   . Heart attack Brother   . Hypertension Sister   . Heart attack Brother   . Other Unknown        noncontributory for early CAD  . Colon cancer Neg Hx   . Esophageal cancer Neg Hx   . Liver disease Neg Hx   . Kidney disease Neg Hx   . Colon polyps Neg Hx     Allergies  Allergen Reactions  . Aspirin Other (See Comments)    High Doses Mess up her stomach; "makes my bowels have blood in them". Takes 81 mg EC Aspirin   . Penicillins Other (See Comments)    SYNCOPE? , "makes me real weak when I take it; like I'll pass out"  Has patient had a PCN reaction causing immediate rash, facial/tongue/throat swelling, SOB or lightheadedness with hypotension: Yes Has patient had a PCN reaction causing severe rash involving mucus membranes or skin necrosis: no Has patient had a PCN reaction that required hospitalization no Has patient had a PCN reaction occurring within the last 10 years: no If all of the above  . Bactrim [Sulfamethoxazole-Trimethoprim] Rash  . Contrast Media [Iodinated Diagnostic Agents] Itching  . Iron Itching and Other (See Comments)    "they gave me iron in dialysis; had to give me Benadryl cause I had to have the iron" (05/02/2012)  . Nitrofurantoin Hives  . Prilosec [Omeprazole] Other (See Comments)    "back spasms"  . Tylenol [Acetaminophen] Itching and Other (See Comments)    Makes her feet on fire per pt  . Dexilant [Dexlansoprazole] Other  (See Comments)    Upset stomach  . Levaquin [Levofloxacin In D5w] Rash  . Morphine And Related Itching    Itching in feet  . Plavix [Clopidogrel Bisulfate] Rash  . Protonix [Pantoprazole Sodium] Rash  . Venofer [Ferric Oxide] Itching    Patient reports using Benadryl prior to doses as [place] (Negative unless checked)  Constitutional: [] Weight loss  [] Fever  [] Chills Cardiac: [] Chest pain   [] Chest pressure   [] Palpitations   [] Shortness of breath when laying flat   [] Shortness of breath with exertion. Vascular:  [] Pain in legs with walking   [x] Pain in her left arm  [] History of DVT   [] Phlebitis   [] Swelling in legs   [] Varicose veins   [] Non-healing ulcers Pulmonary:   [] Uses home oxygen   [] Productive cough   [] Hemoptysis   [] Wheeze  [] COPD   [] Asthma Neurologic:  [] Dizziness   [] Seizures   [] History of stroke   [] History of TIA  [] Aphasia   [] Vissual changes   [] Weakness or numbness in arm   [] Weakness or numbness in leg Musculoskeletal:   [] Joint swelling   [] Joint pain   [] Low back pain Hematologic:  [] Easy bruising  [] Easy bleeding   [] Hypercoagulable state   [] Anemic Gastrointestinal:  [] Diarrhea   [] Vomiting  [] Gastroesophageal reflux/heartburn   [] Difficulty swallowing. Genitourinary:  [x] Chronic kidney disease   [] Difficult urination  [] Frequent urination   [] Blood in urine Skin:  [] Rashes   [] Ulcers  Psychological:  [] History of anxiety   []  History of major depression.  Physical Examination  Vitals:   01/19/17 1125  BP: (!) 156/79  Pulse: 77  Resp: 16  Weight: 58.1 kg (128 lb)   Body mass index is 23.41 kg/m. Gen: WD/WN, NAD Head: [place]/AT, No temporalis wasting.  Ear/Nose/Throat: Hearing grossly intact, nares w/o  erythema or drainage Eyes: PER, EOMI, sclera nonicteric.  Neck: Supple, no large masses.   Pulmonary:  Good air movement, no audible wheezing bilaterally, no use of accessory muscles.  Cardiac: RRR, no JVD Vascular: Left  arm shows an aneurysmal fistula near the antecubital fossa. At the level of the shoulder the fistula is nonpalpable and cordlike. Vessel Right Left  Radial Palpable Palpable  Ulnar Palpable Palpable  Brachial Palpable Palpable  Carotid Palpable Palpable  Gastrointestinal: Non-distended. No guarding/no peritoneal signs.  Musculoskeletal: M/S 5/5 throughout.  No deformity or atrophy.  Neurologic: CN 2-12 intact. Symmetrical.  Speech is fluent. Motor exam as listed above. Psychiatric: Judgment intact, Mood & affect appropriate for pt's clinical situation. Dermatologic: No rashes or ulcers noted.  No changes consistent with cellulitis. Lymph : No lichenification or skin changes of chronic lymphedema.  CBC Lab Results  Component Value Date   WBC 8.0 01/20/2017   HGB 11.7 (L) 01/20/2017   HCT 37.4 01/20/2017   MCV 100.3 (H) 01/20/2017   PLT 302 01/20/2017    BMET    Component Value Date/Time   NA 136 01/20/2017 0253   K 4.3 01/20/2017 0253   CL 99 (L) 01/20/2017 0253   CO2 25 01/20/2017 0253   GLUCOSE 103 (H) 01/20/2017 0253   BUN 49 (H) 01/20/2017 0253   CREATININE 7.53 (H) 01/20/2017 0253   CALCIUM 9.1 01/20/2017 0253   CALCIUM 8.1 (L) 05/29/2013 1814   GFRNONAA 5 (L) 01/20/2017 0253   GFRAA 5 (L) 01/20/2017 0253   Estimated Creatinine Clearance: 5 mL/min (A) (by C-G formula based on SCr of 7.53 mg/dL (H)).  COAG Lab Results  Component Value Date   INR 0.91 10/09/2016   INR 1.07 09/20/2016   INR 0.96 09/07/2016    Radiology Dg Chest 2 View  Result Date: 01/20/2017 CLINICAL DATA:  76 year old female with chest pain and palpitation. EXAM: CHEST  2 VIEW COMPARISON:  Chest radiograph dated 10/12/2016 FINDINGS: Right-sided dialysis catheter with tip over the right atrium in stable positioning. The cardiac silhouette is within normal limits. Median sternotomy wires and postsurgical changes of CABG noted. There minimal bibasilar linear atelectasis/ scarring. No focal  consolidation, pleural effusion, or pneumothorax. Overall improvement of the aeration of the lungs compared to prior radiograph. There is atherosclerotic calcification of the abdominal aorta and splenic artery. No acute osseous pathology. IMPRESSION: No active cardiopulmonary disease. Electronically Signed   By: Anner Crete M.D.   On: 01/20/2017 03:32     Assessment/Plan 1. Complication from renal dialysis device, subsequent encounter Recommend:  At this time the patient does not have appropriate extremity access for dialysis. Her axillary and central veins on the left are widely patent. The brachiocephalic fistula is aneurysmal in the antecubital area and then occludes and so was nonfunctional.  Patient should have the aneurysmal portion of her fistula resected and then a left brachial axillary AV graft will be created.  The risks, benefits and alternative therapies were reviewed in detail with the patient.  All questions were answered.  The patient agrees to proceed with surgery.    2. ESRD (end stage renal disease) ([place]) Patient will continue dialysis via her catheter without interruption  3. Atherosclerosis of native coronary artery of native heart without angina pectoris Continue cardiac and antihypertensive medications as already ordered and reviewed, no changes at this time.  Continue statin as ordered and reviewed, no changes at this time  Nitrates PRN for chest pain   4. Essential hypertension Continue antihypertensive  medications as already ordered, these medications have been reviewed and there are no changes at this time.   5. Pure hypercholesterolemia Continue statin as ordered and reviewed, no changes at this time     Hortencia Pilar, MD  01/20/2017 4:54 PM

## 2017-01-20 NOTE — ED Notes (Signed)
Attempted to gain IV access, with no success. L arm restricted.

## 2017-01-20 NOTE — Procedures (Signed)
Pt seen on HD.  Ap 120 Vp 100  BFR 300.  SBP 174.  Tolerating HD well so far.

## 2017-01-20 NOTE — Progress Notes (Signed)
Pt arrived to floor via nurse. Pt's A&Ox4. Chest pain is 1/10. Pt's resting comfortably in bed with no other complaints. Call bell within reach. Medication given for high bp

## 2017-01-20 NOTE — ED Notes (Signed)
Pt reports L sided CP present since before going to bed last night. Also endorses nausea. Pain 6/10. Took 1NTG and ASA before arrival. Pt had stents placed last month.

## 2017-01-20 NOTE — ED Notes (Signed)
IV Team at bedside 

## 2017-01-20 NOTE — H&P (Signed)
History and Physical    Shawna [name] OBS:[id number] DOB: 1941/03/21 DOA: 01/20/2017  PCP: [name], MD  Patient coming from: Home  I have personally briefly reviewed patient's old medical records in [place]  Chief Complaint: Chest pain  HPI: Shawna [name] is a 76 y.o. female with medical history significant of CAD, CABG, recently had stent to diag for ISR in April of this year, ESRD dialysis on MWF.  Patient presents to the ED with c/o chest pain.  Of note her BPs have been running high since dialysis 2 days ago.  She notes she ran out of her Imdur 1 week ago and doesn't have any refills left on this med.  CP onset 3 hours ago, dull, radiates into arms.  ASA and NTG with some relief.   ED Course: initial BP is 226/97.  Patient given SL NTG, BP goes all the way down to 476 systolic and her CP has resolved at this time.  She has not missed any dialysis sessions; however, she did run out of her Imdur about a week ago and has no refills left on this med and so hasnt been able to take this.   Review of Systems: As per HPI otherwise 10 point review of systems negative.   Past Medical History:  Diagnosis Date  . Acute on chronic respiratory failure with hypoxia ([place]) 10/10/2016  . Anxiety   . Arthritis   . AVM (arteriovenous malformation) of colon   . CAD (coronary artery disease)    a. 12/2011 NSTEMI/Cath/PCI LCX (2.25x14 Resolute DES) & D1 (2.25x22 Resolute DES);  b. 01/2012 Cath/PCI: LM 30, LAD 30p, 40-80m, D1 stent ok, 99 in sm branch of diag, LCX patent stent, OM1 20, RCA 95 ost (4.0x12 Promus DES), EF 55%;  c. 04/2012 Lexi Cardiolite  EF 48%, small area of scar @ base/mid inflat wall with mild peri-infarct ischemia.; CABG 12/4  . Carotid artery disease ([place])    a. 54-65% LICA, 0/3546   . Chronic bronchitis ([place])   . Chronic diastolic CHF (congestive heart failure) ([place])    a. 02/2012 Echo EF 60-65%, nl wall motion, Gr 1 DD, mod MR  . Colon cancer ([place]) 1992  . Esophageal  stricture   . ESRD on hemodialysis ([place])    ESRD due to HTN, started dialysis 2011 and gets HD at [facility] with Dr Hinda Lenis on MWF schedule.  Access is LUA AVF as of Sept 2014.   Marland Kitchen GERD (gastroesophageal reflux disease)   . High cholesterol 12/2011  . History of blood transfusion 07/2011; 12/2011; 01/2012 X 2; 04/2012  . History of gout   . History of lower GI bleeding   . Hypertension   . Iron deficiency anemia   . Mitral regurgitation    a. Moderate by echo, 02/2012  . Myocardial infarction ([place])   . Ovarian cancer ([place]) 1992  . Pneumonia ~ 2009  . PUD (peptic ulcer disease)   . TIA (transient ischemic attack)     Past Surgical History:  Procedure Laterality Date  . ABDOMINAL HYSTERECTOMY  1992  . APPENDECTOMY  06/1990  . AV FISTULA PLACEMENT  07/2009   left upper arm  . AV FISTULA PLACEMENT Right 09/06/2016   Procedure: RIGHT FOREARM ARTERIOVENOUS (AV) GRAFT;  Surgeon: Elam Dutch, MD;  Location: [place];  Service: Vascular;  Laterality: Right;  . [place] REMOVAL Right 09/06/2016   Procedure: REMOVAL OF Right Arm ARTERIOVENOUS GORETEX GRAFT and Vein Patch angioplasty of brachial  artery;  Surgeon: Angelia Mould, MD;  Location: [place];  Service: Vascular;  Laterality: Right;  . COLON RESECTION  1992  . CORONARY ANGIOPLASTY WITH STENT PLACEMENT  12/15/11   "2"  . CORONARY ANGIOPLASTY WITH STENT PLACEMENT  y/2013   "1; makes total of 3" (05/02/2012)  . CORONARY ARTERY BYPASS GRAFT  06/13/2012   Procedure: CORONARY ARTERY BYPASS GRAFTING (CABG);  Surgeon: Grace Isaac, MD;  Location: [place];  Service: Open Heart Surgery;  Laterality: N/A;  cabg x four;  using left internal mammary artery, and left leg greater saphenous vein harvested endoscopically  . CORONARY STENT INTERVENTION N/A 10/13/2016   Procedure: Coronary Stent Intervention;  Surgeon: Troy Sine, MD;  Location: [place] CV LAB;  Service: Cardiovascular;  Laterality: N/A;  . DILATION AND CURETTAGE OF UTERUS    .  ESOPHAGOGASTRODUODENOSCOPY  01/20/2012   Procedure: ESOPHAGOGASTRODUODENOSCOPY (EGD);  Surgeon: Ladene Artist, MD,FACG;  Location: [facility] ENDOSCOPY;  Service: Endoscopy;  Laterality: N/A;  . ESOPHAGOGASTRODUODENOSCOPY N/A 03/26/2013   Procedure: ESOPHAGOGASTRODUODENOSCOPY (EGD);  Surgeon: Irene Shipper, MD;  Location: [facility] ENDOSCOPY;  Service: Endoscopy;  Laterality: N/A;  . ESOPHAGOGASTRODUODENOSCOPY N/A 04/30/2015   Procedure: ESOPHAGOGASTRODUODENOSCOPY (EGD);  Surgeon: Rogene [place], MD;  [facility];  Service: Endoscopy;  Laterality: N/A;  1pm - moved to 10/20 @ 1:10  . ESOPHAGOGASTRODUODENOSCOPY N/A 07/29/2016   Procedure: ESOPHAGOGASTRODUODENOSCOPY (EGD);  Surgeon: Manus Gunning, MD;  Location: [place];  Service: Gastroenterology;  Laterality: N/A;  enteroscopy  . INTRAOPERATIVE TRANSESOPHAGEAL ECHOCARDIOGRAM  06/13/2012   Procedure: INTRAOPERATIVE TRANSESOPHAGEAL ECHOCARDIOGRAM;  Surgeon: Grace Isaac, MD;  Location: [place];  Service: Open Heart Surgery;  Laterality: N/A;  . IR GENERIC HISTORICAL  07/26/2016   IR FLUORO GUIDE CV LINE RIGHT 07/26/2016 Greggory Keen, MD [facility]  . IR GENERIC HISTORICAL  07/26/2016   IR US GUIDE VASC ACCESS RIGHT 07/26/2016 Greggory Keen, MD [facility]  . IR GENERIC HISTORICAL  08/02/2016   IR US GUIDE VASC ACCESS RIGHT 08/02/2016 Greggory Keen, MD [facility]  . IR GENERIC HISTORICAL  08/02/2016   IR FLUORO GUIDE CV LINE RIGHT 08/02/2016 Greggory Keen, MD [facility]  . LEFT HEART CATH AND CORONARY ANGIOGRAPHY N/A 09/20/2016   Procedure: Left Heart Cath and Coronary Angiography;  Surgeon: [name], MD;  Location: [place] CV LAB;  Service: Cardiovascular;  Laterality: N/A;  . LEFT HEART CATH AND CORS/GRAFTS ANGIOGRAPHY N/A 10/13/2016   Procedure: Left Heart Cath and Cors/Grafts Angiography;  Surgeon: Troy Sine, MD;  Location: [place] CV LAB;  Service: Cardiovascular;  Laterality: N/A;  . LEFT HEART CATHETERIZATION  WITH CORONARY ANGIOGRAM N/A 12/15/2011   Procedure: LEFT HEART CATHETERIZATION WITH CORONARY ANGIOGRAM;  Surgeon: Burnell Blanks, MD;  Location: [facility] CATH LAB;  Service: Cardiovascular;  Laterality: N/A;  . LEFT HEART CATHETERIZATION WITH CORONARY ANGIOGRAM N/A 01/10/2012   Procedure: LEFT HEART CATHETERIZATION WITH CORONARY ANGIOGRAM;  Surgeon: Peter M Martinique, MD;  Location: [facility] CATH LAB;  Service: Cardiovascular;  Laterality: N/A;  . LEFT HEART CATHETERIZATION WITH CORONARY ANGIOGRAM N/A 06/08/2012   Procedure: LEFT HEART CATHETERIZATION WITH CORONARY ANGIOGRAM;  Surgeon: Burnell Blanks, MD;  Location: [facility] CATH LAB;  Service: Cardiovascular;  Laterality: N/A;  . LEFT HEART CATHETERIZATION WITH CORONARY/GRAFT ANGIOGRAM N/A 12/10/2013   Procedure: LEFT HEART CATHETERIZATION WITH Beatrix Fetters;  Surgeon: [name], MD;  Location: [facility] CATH LAB;  Service: Cardiovascular;  Laterality: N/A;  . OVARY SURGERY  ovarian cancer  . SHUNTOGRAM N/A 10/15/2013   Procedure: Fistulogram;  Surgeon: Serafina Mitchell, MD;  Location: [facility] CATH LAB;  Service: Cardiovascular;  Laterality: N/A;  . THROMBECTOMY / ARTERIOVENOUS GRAFT REVISION  2011   left upper arm  . TUBAL LIGATION  1980's  . UPPER EXTREMITY ANGIOGRAPHY Bilateral 12/06/2016   Procedure: Upper Extremity Angiography;  Surgeon: Katha Cabal, MD;  Location: [place] CV LAB;  Service: Cardiovascular;  Laterality: Bilateral;     reports that she has never smoked. She has never used smokeless tobacco. She reports that she does not drink alcohol or use drugs.  Allergies  Allergen Reactions  . Aspirin Other (See Comments)    High Doses Mess up her stomach; "makes my bowels have blood in them". Takes 81 mg EC Aspirin   . Penicillins Other (See Comments)    SYNCOPE? , "makes me real weak when I take it; like I'll pass out"  Has patient had a PCN reaction causing immediate rash, facial/tongue/throat swelling, SOB or  lightheadedness with hypotension: Yes Has patient had a PCN reaction causing severe rash involving mucus membranes or skin necrosis: no Has patient had a PCN reaction that required hospitalization no Has patient had a PCN reaction occurring within the last 10 years: no If all of the above  . Bactrim [Sulfamethoxazole-Trimethoprim] Rash  . Contrast Media [Iodinated Diagnostic Agents] Itching  . Iron Itching and Other (See Comments)    "they gave me iron in dialysis; had to give me Benadryl cause I had to have the iron" (05/02/2012)  . Nitrofurantoin Hives  . Prilosec [Omeprazole] Other (See Comments)    "back spasms"  . Tylenol [Acetaminophen] Itching and Other (See Comments)    Makes her feet on fire per pt  . Dexilant [Dexlansoprazole] Other (See Comments)    Upset stomach  . Levaquin [Levofloxacin In D5w] Rash  . Morphine And Related Itching    Itching in feet  . Plavix [Clopidogrel Bisulfate] Rash  . Protonix [Pantoprazole Sodium] Rash  . Venofer [Ferric Oxide] Itching    Patient reports using Benadryl prior to doses as [place]    Family History  Problem Relation Age of Onset  . Heart disease Mother        Heart Disease before age 50  . Hyperlipidemia Mother   . Hypertension Mother   . Diabetes Mother   . Heart attack Mother   . Heart disease Father        Heart Disease before age 53  . Hyperlipidemia Father   . Hypertension Father   . Diabetes Father   . Diabetes Sister   . Hypertension Sister   . Diabetes Brother   . Hyperlipidemia Brother   . Heart attack Brother   . Hypertension Sister   . Heart attack Brother   . Other Unknown        noncontributory for early CAD  . Colon cancer Neg Hx   . Esophageal cancer Neg Hx   . Liver disease Neg Hx   . Kidney disease Neg Hx   . Colon polyps Neg Hx      Prior to Admission medications   Medication Sig Start Date End Date Taking? Authorizing Provider  ALPRAZolam (XANAX) 0.25 MG tablet Take 0.25 mg by mouth 2  (two) times daily as needed for anxiety or sleep.  11/19/15  Yes [provider]  amiodarone (PACERONE) 200 MG tablet Take 1 tablet (200 mg total) by mouth daily. 12/08/16  Yes  Herminio Commons, MD  amLODipine (NORVASC) 2.5 MG tablet Take 2.5 mg by mouth daily.   Yes [provider]  aspirin EC 81 MG tablet Take 81 mg by mouth daily.    Yes [provider]  benzonatate (TESSALON) 100 MG capsule Take 1 capsule (100 mg total) by mouth every 8 (eight) hours. Patient taking differently: Take 100 mg by mouth 3 (three) times daily as needed for cough.  11/18/16  Yes Varney Biles, MD  carvedilol (COREG) 6.25 MG tablet Take 1 tablet (6.25 mg total) by mouth 2 (two) times daily with a meal. 11/16/16  Yes Herminio Commons, MD  cinacalcet (SENSIPAR) 30 MG tablet Take 30 mg by mouth daily after supper.   Yes [provider]  cloNIDine (CATAPRES) 0.2 MG tablet Take 1 tablet (0.2 mg total) by mouth 3 (three) times daily. 12/08/16  Yes Herminio Commons, MD  epoetin alfa (EPOGEN,PROCRIT) 88502 UNIT/ML injection Inject 1 mL (10,000 Units total) into the vein every Monday, Wednesday, and Friday with hemodialysis. 04/11/16  Yes Orvan Falconer, MD  fluticasone (FLONASE) 50 MCG/ACT nasal spray Place 2 sprays into the nose at bedtime.    Yes [provider]  hydrALAZINE (APRESOLINE) 50 MG tablet Take 2 tablets (100 mg total) by mouth 3 (three) times daily. Patient taking differently: Take 100 mg by mouth 3 (three) times daily as needed (for high blood pressure readings).  11/10/16 02/08/17 Yes Herminio Commons, MD  isosorbide mononitrate (IMDUR) 120 MG 24 hr tablet Take 1 tablet (120 mg total) by mouth daily. 09/22/16  Yes Carlota Raspberry, Tiffany, PA-C  loratadine (CLARITIN) 10 MG tablet Take 10 mg by mouth daily.    Yes [provider]  multivitamin (RENA-VIT) TABS tablet Take 1 tablet by mouth daily.   Yes [provider]  nitroGLYCERIN (NITROSTAT) 0.4 MG SL  tablet Place 1 tablet (0.4 mg total) under the tongue every 5 (five) minutes as needed for chest pain. 08/02/16  Yes Buriev, Arie Sabina, MD  omeprazole (PRILOSEC) 20 MG capsule Take 20 mg by mouth daily.  09/12/16  Yes [provider]  ondansetron (ZOFRAN-ODT) 4 MG disintegrating tablet Take 4 mg by mouth 2 (two) times daily as needed for nausea or vomiting.   Yes [provider]  sevelamer carbonate (RENVELA) 800 MG tablet Take 800 mg by mouth 3 (three) times daily with meals.   Yes [provider]  simvastatin (ZOCOR) 20 MG tablet Take 20 mg by mouth at bedtime.   Yes [provider]  ticagrelor (BRILINTA) 90 MG TABS tablet Take 90 mg by mouth 2 (two) times daily.   Yes [provider]    Physical Exam: Vitals:   01/20/17 0415 01/20/17 0430 01/20/17 0445 01/20/17 0500  BP: (!) 150/60 (!) 170/73 (!) 147/61 (!) 159/56  Pulse: 74 73 78 78  Resp: 19 15 (!) 23 18  Temp:      TempSrc:      SpO2: 96% 97% 96% 96%  Weight:      Height:        Constitutional: NAD, calm, comfortable Eyes: PERRL, lids and conjunctivae normal ENMT: Mucous membranes are moist. Posterior pharynx clear of any exudate or lesions.Normal dentition.  Neck: normal, supple, no masses, no thyromegaly Respiratory: clear to auscultation bilaterally, no wheezing, no crackles. Normal respiratory effort. No accessory muscle use.  Cardiovascular: Regular rate and rhythm, no murmurs / rubs / gallops. No extremity edema. 2+ pedal pulses. No carotid bruits.  Abdomen: no  tenderness, no masses palpated. No hepatosplenomegaly. Bowel sounds positive.  Musculoskeletal: no clubbing / cyanosis. No joint deformity upper and lower extremities. Good ROM, no contractures. Normal muscle tone.  Skin: no rashes, lesions, ulcers. No induration Neurologic: CN 2-12 grossly intact. Sensation intact, DTR normal. Strength 5/5 in all 4.  Psychiatric: Normal judgment and insight. Alert and oriented x 3. Normal  mood.    Labs on Admission: I have personally reviewed following labs and imaging studies  CBC:  Recent Labs Lab 01/20/17 0253  WBC 8.0  HGB 11.7*  HCT 37.4  MCV 100.3*  PLT 283   Basic Metabolic Panel:  Recent Labs Lab 01/20/17 0253  NA 136  K 4.3  CL 99*  CO2 25  GLUCOSE 103*  BUN 49*  CREATININE 7.53*  CALCIUM 9.1   GFR: Estimated Creatinine Clearance: 5 mL/min (A) (by C-G formula based on SCr of 7.53 mg/dL (H)). Liver Function Tests: No results for input(s): AST, ALT, ALKPHOS, BILITOT, PROT, ALBUMIN in the last 168 hours. No results for input(s): LIPASE, AMYLASE in the last 168 hours. No results for input(s): AMMONIA in the last 168 hours. Coagulation Profile: No results for input(s): INR, PROTIME in the last 168 hours. Cardiac Enzymes: No results for input(s): CKTOTAL, CKMB, CKMBINDEX, TROPONINI in the last 168 hours. BNP (last 3 results) No results for input(s): PROBNP in the last 8760 hours. HbA1C: No results for input(s): HGBA1C in the last 72 hours. CBG: No results for input(s): GLUCAP in the last 168 hours. Lipid Profile: No results for input(s): CHOL, HDL, LDLCALC, TRIG, CHOLHDL, LDLDIRECT in the last 72 hours. Thyroid Function Tests: No results for input(s): TSH, T4TOTAL, FREET4, T3FREE, THYROIDAB in the last 72 hours. Anemia Panel: No results for input(s): VITAMINB12, FOLATE, FERRITIN, TIBC, IRON, RETICCTPCT in the last 72 hours. Urine analysis:    Component Value Date/Time   COLORURINE YELLOW 12/16/2012 1919   APPEARANCEUR CLOUDY (A) 12/16/2012 1919   LABSPEC 1.009 12/16/2012 1919   PHURINE 7.5 12/16/2012 1919   GLUCOSEU NEGATIVE 12/16/2012 1919   HGBUR TRACE (A) 12/16/2012 1919   BILIRUBINUR NEGATIVE 12/16/2012 1919   KETONESUR NEGATIVE 12/16/2012 1919   PROTEINUR 100 (A) 12/16/2012 1919   UROBILINOGEN 0.2 12/16/2012 1919   NITRITE NEGATIVE 12/16/2012 1919   LEUKOCYTESUR SMALL (A) 12/16/2012 1919    Radiological Exams on  Admission: Dg Chest 2 View  Result Date: 01/20/2017 CLINICAL DATA:  76 year old female with chest pain and palpitation. EXAM: CHEST  2 VIEW COMPARISON:  Chest radiograph dated 10/12/2016 FINDINGS: Right-sided dialysis catheter with tip over the right atrium in stable positioning. The cardiac silhouette is within normal limits. Median sternotomy wires and postsurgical changes of CABG noted. There minimal bibasilar linear atelectasis/ scarring. No focal consolidation, pleural effusion, or pneumothorax. Overall improvement of the aeration of the lungs compared to prior radiograph. There is atherosclerotic calcification of the abdominal aorta and splenic artery. No acute osseous pathology. IMPRESSION: No active cardiopulmonary disease. Electronically Signed   By: Anner Crete M.D.   On: 01/20/2017 03:32    EKG: Independently reviewed.  Assessment/Plan Principal Problem:   Hypertensive urgency Active Problems:   Atherosclerosis of native coronary artery of native heart without angina pectoris   ESRD on hemodialysis ([place])   Essential hypertension    1. HTN urgency - and chest pain which gets better with SL NTG 1. Resume home BP meds including the missing Imdur that she ran out of last week. 1. She states she takes clonadine, coreg, hydralazine PRN (  will hold the PO hydralazine PRN for now and use IV PRN instead) 2. Denies taking norvasc and isnt sure where that med came from on her list... 2. Serial trops 3. Tele monitor 2. ESRD - left message with nephrology for routine dialysis today 3. CAD - continue ASA, brilintia, statin  DVT prophylaxis: Heparin [place] Code Status: Full Family Communication: Husband at bedside Disposition Plan: Home after admit Consults called: None Admission status: Place in obs   Alayssa Flinchum, [facility] Pager [phone]  If 7AM-7PM, please contact day team taking care of patient [email or web address] Password [email or web address]  01/20/2017, 5:30 AM

## 2017-01-20 NOTE — ED Provider Notes (Signed)
[facility] DEPT Provider Note   CSN: [id number] Arrival date & time: 01/20/17  0244  By signing my name below, I, Theresia Bough, attest that this documentation has been prepared under the direction and in the presence of Sherwood Gambler, MD. Electronically Signed: Theresia Bough, ED Scribe. 01/20/17. 3:46 AM.  History   Chief Complaint Chief Complaint  Patient presents with  . Chest Pain   The history is provided by the patient. No language interpreter was used.   HPI Comments: Shawna [name] is a 76 y.o. female with a PMHx of CAD, who presents to the Emergency Department complaining of intermittent, moderate chest pain onset 3 hours ago.  Pt describes pain as dull and that it radiates into her arms. Pt reports associated increased palpitations, chest tenderness, diaphoresis, nausea and pain in shoulder blades. Pt tried ASA and NTG with mild relief. Pt's next dialysis is later today. Pt denies SOB, leg swelling or any other complaints at this time.  Past Medical History:  Diagnosis Date  . Acute on chronic respiratory failure with hypoxia ([place]) 10/10/2016  . Anxiety   . Arthritis   . AVM (arteriovenous malformation) of colon   . CAD (coronary artery disease)    a. 12/2011 NSTEMI/Cath/PCI LCX (2.25x14 Resolute DES) & D1 (2.25x22 Resolute DES);  b. 01/2012 Cath/PCI: LM 30, LAD 30p, 40-63m, D1 stent ok, 99 in sm branch of diag, LCX patent stent, OM1 20, RCA 95 ost (4.0x12 Promus DES), EF 55%;  c. 04/2012 Lexi Cardiolite  EF 48%, small area of scar @ base/mid inflat wall with mild peri-infarct ischemia.; CABG 12/4  . Carotid artery disease ([place])    a. 08-65% LICA, 01/8468   . Chronic bronchitis ([place])   . Chronic diastolic CHF (congestive heart failure) ([place])    a. 02/2012 Echo EF 60-65%, nl wall motion, Gr 1 DD, mod MR  . Colon cancer ([place]) 1992  . Esophageal stricture   . ESRD on hemodialysis ([place])    ESRD due to HTN, started dialysis 2011 and gets HD at [facility] with Dr Hinda Lenis  on MWF schedule.  Access is LUA AVF as of Sept 2014.   Marland Kitchen GERD (gastroesophageal reflux disease)   . High cholesterol 12/2011  . History of blood transfusion 07/2011; 12/2011; 01/2012 X 2; 04/2012  . History of gout   . History of lower GI bleeding   . Hypertension   . Iron deficiency anemia   . Mitral regurgitation    a. Moderate by echo, 02/2012  . Myocardial infarction ([place])   . Ovarian cancer ([place]) 1992  . Pneumonia ~ 2009  . PUD (peptic ulcer disease)   . TIA (transient ischemic attack)     Patient Active Problem List   Diagnosis Date Noted  . Symptomatic anemia 10/24/2016  . H/O non-ST elevation myocardial infarction (NSTEMI) 10/24/2016  . Fluid overload 10/10/2016  . Complication from renal dialysis device 10/10/2016  . Non-ST elevation MI (NSTEMI) ([place])   . Encounter for fitting and adjustment of vascular catheter   . End stage renal disease ([place])   . ESRD (end stage renal disease) ([name])   . Heme positive stool   . Demand ischemia ([place]) 07/27/2016  . Hypertensive emergency 07/08/2016  . Acute on chronic respiratory failure with hypoxia ([place])   . Cardiac arrest ([place])   . Palliative care encounter   . Goals of care, counseling/discussion   . Hypertensive crisis without congestive heart failure 05/09/2016  . Acute pulmonary edema ([place]) 04/06/2016  .  Acute respiratory failure ([place]) 04/06/2016  . Hypertensive crisis 01/27/2016  . History of colon cancer 01/27/2016  . History of ovarian cancer 01/27/2016  . Hypertensive urgency 01/27/2016  . Narrow complex tachycardia ([place]) 09/08/2015  . SVT (supraventricular tachycardia) ([place]) 09/08/2015  . Influenza A 08/30/2015  . Acute on chronic diastolic CHF (congestive heart failure) ([place]) 05/04/2015  . Unstable angina ([place]) 05/03/2015  . Essential hypertension   . Pain in joint, lower leg 08/14/2014  . Chest pain 11/26/2013  . Small bowel obstruction, partial ([place]) 05/29/2013  . Chronic diastolic CHF (congestive heart failure)  ([place]) 03/22/2013  . GI bleeding 03/21/2013  . Acute blood loss anemia 03/21/2013  . Vaginal odor 03/12/2013  . Vaginal discharge 03/12/2013  . Occlusion and stenosis of carotid artery without mention of cerebral infarction 01/24/2013  . Hx of CABG 07/05/2012  . Carotid artery disease ([place]) 07/05/2012  . Anemia of chronic kidney failure 07/03/2012  . Secondary hyperparathyroidism ([place]) 07/03/2012  . Mitral regurgitation 06/12/2012  . Pneumonia 06/09/2012  . Non-STEMI (non-ST elevated myocardial infarction) ([place]) 06/08/2012  . AVM (arteriovenous malformation) of small bowel, acquired ([place]) 01/20/2012  . GERD (gastroesophageal reflux disease) 01/09/2012  . HLD (hyperlipidemia) 01/05/2012  . Atherosclerosis of native coronary artery of native heart without angina pectoris 12/16/2011  . ESRD on hemodialysis ([place]) 12/16/2011    Past Surgical History:  Procedure Laterality Date  . ABDOMINAL HYSTERECTOMY  1992  . APPENDECTOMY  06/1990  . AV FISTULA PLACEMENT  07/2009   left upper arm  . AV FISTULA PLACEMENT Right 09/06/2016   Procedure: RIGHT FOREARM ARTERIOVENOUS (AV) GRAFT;  Surgeon: Elam Dutch, MD;  Location: [facility];  Service: Vascular;  Laterality: Right;  . [place] REMOVAL Right 09/06/2016   Procedure: REMOVAL OF Right Arm ARTERIOVENOUS GORETEX GRAFT and Vein Patch angioplasty of brachial artery;  Surgeon: Angelia Mould, MD;  Location: [facility];  Service: Vascular;  Laterality: Right;  . COLON RESECTION  1992  . CORONARY ANGIOPLASTY WITH STENT PLACEMENT  12/15/11   "2"  . CORONARY ANGIOPLASTY WITH STENT PLACEMENT  y/2013   "1; makes total of 3" (05/02/2012)  . CORONARY ARTERY BYPASS GRAFT  06/13/2012   Procedure: CORONARY ARTERY BYPASS GRAFTING (CABG);  Surgeon: Grace Isaac, MD;  Location: [facility];  Service: Open Heart Surgery;  Laterality: N/A;  cabg x four;  using left internal mammary artery, and left leg greater saphenous vein harvested endoscopically  . CORONARY STENT  INTERVENTION N/A 10/13/2016   Procedure: Coronary Stent Intervention;  Surgeon: Troy Sine, MD;  Location: [place] CV LAB;  Service: Cardiovascular;  Laterality: N/A;  . DILATION AND CURETTAGE OF UTERUS    . ESOPHAGOGASTRODUODENOSCOPY  01/20/2012   Procedure: ESOPHAGOGASTRODUODENOSCOPY (EGD);  Surgeon: Ladene Artist, MD,FACG;  Location: [facility] ENDOSCOPY;  Service: Endoscopy;  Laterality: N/A;  . ESOPHAGOGASTRODUODENOSCOPY N/A 03/26/2013   Procedure: ESOPHAGOGASTRODUODENOSCOPY (EGD);  Surgeon: Irene Shipper, MD;  Location: [facility] ENDOSCOPY;  Service: Endoscopy;  Laterality: N/A;  . ESOPHAGOGASTRODUODENOSCOPY N/A 04/30/2015   Procedure: ESOPHAGOGASTRODUODENOSCOPY (EGD);  Surgeon: Rogene Houston, MD;  [facility];  Service: Endoscopy;  Laterality: N/A;  1pm - moved to 10/20 @ 1:10  . ESOPHAGOGASTRODUODENOSCOPY N/A 07/29/2016   Procedure: ESOPHAGOGASTRODUODENOSCOPY (EGD);  Surgeon: Manus Gunning, MD;  Location: [facility];  Service: Gastroenterology;  Laterality: N/A;  enteroscopy  . INTRAOPERATIVE TRANSESOPHAGEAL ECHOCARDIOGRAM  06/13/2012   Procedure: INTRAOPERATIVE TRANSESOPHAGEAL ECHOCARDIOGRAM;  Surgeon: Grace Isaac, MD;  Location: [facility];  Service: Open  Heart Surgery;  Laterality: N/A;  . IR GENERIC HISTORICAL  07/26/2016   IR FLUORO GUIDE CV LINE RIGHT 07/26/2016 Greggory Keen, MD [facility]-INTER[facility] RAD  . IR GENERIC HISTORICAL  07/26/2016   IR US GUIDE VASC ACCESS RIGHT 07/26/2016 Greggory Keen, MD [facility]  . IR GENERIC HISTORICAL  08/02/2016   IR US GUIDE VASC ACCESS RIGHT 08/02/2016 Greggory Keen, MD [facility]  . IR GENERIC HISTORICAL  08/02/2016   IR FLUORO GUIDE CV LINE RIGHT 08/02/2016 Greggory Keen, MD [facility]  . LEFT HEART CATH AND CORONARY ANGIOGRAPHY N/A 09/20/2016   Procedure: Left Heart Cath and Coronary Angiography;  Surgeon: [name], MD;  Location: [facility] CV LAB;  Service: Cardiovascular;  Laterality: N/A;  . LEFT HEART CATH AND CORS/GRAFTS  ANGIOGRAPHY N/A 10/13/2016   Procedure: Left Heart Cath and Cors/Grafts Angiography;  Surgeon: Troy Sine, MD;  Location: [facility] CV LAB;  Service: Cardiovascular;  Laterality: N/A;  . LEFT HEART CATHETERIZATION WITH CORONARY ANGIOGRAM N/A 12/15/2011   Procedure: LEFT HEART CATHETERIZATION WITH CORONARY ANGIOGRAM;  Surgeon: Burnell Blanks, MD;  Location: [facility] CATH LAB;  Service: Cardiovascular;  Laterality: N/A;  . LEFT HEART CATHETERIZATION WITH CORONARY ANGIOGRAM N/A 01/10/2012   Procedure: LEFT HEART CATHETERIZATION WITH CORONARY ANGIOGRAM;  Surgeon: Peter M Martinique, MD;  Location: [facility] CATH LAB;  Service: Cardiovascular;  Laterality: N/A;  . LEFT HEART CATHETERIZATION WITH CORONARY ANGIOGRAM N/A 06/08/2012   Procedure: LEFT HEART CATHETERIZATION WITH CORONARY ANGIOGRAM;  Surgeon: Burnell Blanks, MD;  Location: [facility] CATH LAB;  Service: Cardiovascular;  Laterality: N/A;  . LEFT HEART CATHETERIZATION WITH CORONARY/GRAFT ANGIOGRAM N/A 12/10/2013   Procedure: LEFT HEART CATHETERIZATION WITH Beatrix Fetters;  Surgeon: [name], MD;  Location: [facility] CATH LAB;  Service: Cardiovascular;  Laterality: N/A;  . OVARY SURGERY     ovarian cancer  . SHUNTOGRAM N/A 10/15/2013   Procedure: Fistulogram;  Surgeon: Serafina Mitchell, MD;  Location: [facility] CATH LAB;  Service: Cardiovascular;  Laterality: N/A;  . THROMBECTOMY / ARTERIOVENOUS GRAFT REVISION  2011   left upper arm  . TUBAL LIGATION  1980's  . UPPER EXTREMITY ANGIOGRAPHY Bilateral 12/06/2016   Procedure: Upper Extremity Angiography;  Surgeon: Katha Cabal, MD;  Location: [place] CV LAB;  Service: Cardiovascular;  Laterality: Bilateral;    OB History    Gravida Para Term Preterm AB Living   2 2   2   2    SAB TAB Ectopic Multiple Live Births                   Home Medications    Prior to Admission medications   Medication Sig Start Date End Date Taking? Authorizing Provider  ALPRAZolam (XANAX) 0.25 MG tablet  Take 0.25 mg by mouth 2 (two) times daily as needed for anxiety or sleep.  11/19/15  Yes [provider]  amiodarone (PACERONE) 200 MG tablet Take 1 tablet (200 mg total) by mouth daily. 12/08/16  Yes Herminio Commons, MD  amLODipine (NORVASC) 2.5 MG tablet Take 2.5 mg by mouth daily.   Yes [provider]  aspirin EC 81 MG tablet Take 81 mg by mouth daily.    Yes [provider]  benzonatate (TESSALON) 100 MG capsule Take 1 capsule (100 mg total) by mouth every 8 (eight) hours. Patient taking differently: Take 100 mg by mouth 3 (three) times daily as needed for cough.  11/18/16  Yes Varney Biles, MD  carvedilol (COREG) 6.25 MG tablet  Take 1 tablet (6.25 mg total) by mouth 2 (two) times daily with a meal. 11/16/16  Yes Herminio Commons, MD  cinacalcet (SENSIPAR) 30 MG tablet Take 30 mg by mouth daily after supper.   Yes [provider]  cloNIDine (CATAPRES) 0.2 MG tablet Take 1 tablet (0.2 mg total) by mouth 3 (three) times daily. 12/08/16  Yes Herminio Commons, MD  epoetin alfa (EPOGEN,PROCRIT) 57322 UNIT/ML injection Inject 1 mL (10,000 Units total) into the vein every Monday, Wednesday, and Friday with hemodialysis. 04/11/16  Yes Orvan Falconer, MD  fluticasone (FLONASE) 50 MCG/ACT nasal spray Place 2 sprays into the nose at bedtime.    Yes [provider]  hydrALAZINE (APRESOLINE) 50 MG tablet Take 2 tablets (100 mg total) by mouth 3 (three) times daily. Patient taking differently: Take 100 mg by mouth 3 (three) times daily as needed (for high blood pressure readings).  11/10/16 02/08/17 Yes Herminio Commons, MD  isosorbide mononitrate (IMDUR) 120 MG 24 hr tablet Take 1 tablet (120 mg total) by mouth daily. 09/22/16  Yes Carlota Raspberry, Tiffany, PA-C  loratadine (CLARITIN) 10 MG tablet Take 10 mg by mouth daily.    Yes [provider]  multivitamin (RENA-VIT) TABS tablet Take 1 tablet by mouth daily.   Yes [provider]  nitroGLYCERIN  (NITROSTAT) 0.4 MG SL tablet Place 1 tablet (0.4 mg total) under the tongue every 5 (five) minutes as needed for chest pain. 08/02/16  Yes Buriev, Arie Sabina, MD  omeprazole (PRILOSEC) 20 MG capsule Take 20 mg by mouth daily.  09/12/16  Yes [provider]  ondansetron (ZOFRAN-ODT) 4 MG disintegrating tablet Take 4 mg by mouth 2 (two) times daily as needed for nausea or vomiting.   Yes [provider]  sevelamer carbonate (RENVELA) 800 MG tablet Take 800 mg by mouth 3 (three) times daily with meals.   Yes [provider]  simvastatin (ZOCOR) 20 MG tablet Take 20 mg by mouth at bedtime.   Yes [provider]  ticagrelor (BRILINTA) 90 MG TABS tablet Take 90 mg by mouth 2 (two) times daily.   Yes [provider]    Family History Family History  Problem Relation Age of Onset  . Heart disease Mother        Heart Disease before age 17  . Hyperlipidemia Mother   . Hypertension Mother   . Diabetes Mother   . Heart attack Mother   . Heart disease Father        Heart Disease before age 92  . Hyperlipidemia Father   . Hypertension Father   . Diabetes Father   . Diabetes Sister   . Hypertension Sister   . Diabetes Brother   . Hyperlipidemia Brother   . Heart attack Brother   . Hypertension Sister   . Heart attack Brother   . Other Unknown        noncontributory for early CAD  . Colon cancer Neg Hx   . Esophageal cancer Neg Hx   . Liver disease Neg Hx   . Kidney disease Neg Hx   . Colon polyps Neg Hx     Social History Social History  Substance Use Topics  . Smoking status: Never Smoker  . Smokeless tobacco: Never Used  . Alcohol use No     Allergies   Aspirin; Penicillins; Bactrim [sulfamethoxazole-trimethoprim]; Contrast media [iodinated diagnostic agents]; Iron; Nitrofurantoin; Prilosec [omeprazole]; Tylenol [acetaminophen]; Dexilant [dexlansoprazole]; Levaquin [levofloxacin in d5w]; Morphine and related; Plavix [clopidogrel bisulfate];  Protonix [pantoprazole sodium]; and Venofer [ferric oxide]   Review of Systems Review of Systems  Constitutional: Positive for diaphoresis.  Respiratory: Positive for chest tightness. Negative for shortness of breath.   Cardiovascular: Positive for chest pain and palpitations. Negative for leg swelling.  Gastrointestinal: Positive for nausea.  Musculoskeletal: Positive for myalgias.  All other systems reviewed and are negative.    Physical Exam Updated Vital Signs BP (!) 157/68   Pulse 77   Temp 98.4 F (36.9 C) (Oral)   Resp 19   Ht 5\' 2"  (1.575 m)   Wt 58.2 kg (128 lb 6.4 oz)   SpO2 98%   BMI 23.48 kg/m   Physical Exam  Constitutional: She is oriented to person, place, and time. She appears well-developed and well-nourished.  HENT:  Head: Normocephalic and atraumatic.  Right Ear: External ear normal.  Left Ear: External ear normal.  Nose: Nose normal.  Eyes: Right eye exhibits no discharge. Left eye exhibits no discharge.  Cardiovascular: Normal rate, regular rhythm, normal heart sounds and intact distal pulses.   2+ radial pulses bilaterally.   Pulmonary/Chest: Effort normal and breath sounds normal. She exhibits tenderness.  Mild left-sided chest wall tenderness and mild thoracic tenderness in between her scapula.   Abdominal: Soft. There is no tenderness.  Neurological: She is alert and oriented to person, place, and time.  Skin: Skin is warm and dry.  Nursing note and vitals reviewed.    ED Treatments / Results  DIAGNOSTIC STUDIES: Oxygen Saturation is 95% on RA, adequate by my interpretation.   COORDINATION OF CARE: 3:43 AM-Discussed next steps with pt including more ASA and NTG. Pt verbalized understanding and is agreeable with the plan.   Labs (all labs ordered are listed, but only abnormal results are displayed) Labs Reviewed  BASIC METABOLIC PANEL - Abnormal; Notable for the following:       Result Value   Chloride 99 (*)    Glucose, Bld 103 (*)     BUN 49 (*)    Creatinine, Ser 7.53 (*)    GFR calc non Af Amer 5 (*)    GFR calc Af Amer 5 (*)    All other components within normal limits  CBC - Abnormal; Notable for the following:    RBC 3.73 (*)    Hemoglobin 11.7 (*)    MCV 100.3 (*)    RDW 15.8 (*)    All other components within normal limits  TROPONIN I - Abnormal; Notable for the following:    Troponin I 0.05 (*)    All other components within normal limits  TROPONIN I  TROPONIN I  I-STAT TROPOININ, ED    EKG  EKG Interpretation  Date/Time:  Friday January 20 2017 03:32:15 EDT Ventricular Rate:  79 PR Interval:  166 QRS Duration: 112 QT Interval:  430 QTC Calculation: 493 R Axis:   30 Text Interpretation:  Sinus rhythm LVH with IVCD and secondary repol abnrm Anterior ST elevation, probably due to LVH Borderline prolonged QT interval ST/T changes similar to April 2018 Confirmed by Sherwood Gambler [phone]) on 01/20/2017 4:05:11 AM Also confirmed by Sherwood Gambler [phone]), editor Hattie Perch ([id number])  on 01/20/2017 6:53:16 AM       Radiology Dg Chest 2 View  Result Date: 01/20/2017 CLINICAL DATA:  76 year old female with chest pain and palpitation. EXAM: CHEST  2 VIEW COMPARISON:  Chest radiograph dated 10/12/2016 FINDINGS: Right-sided dialysis catheter with tip over the right atrium in stable positioning. The cardiac silhouette is  within normal limits. Median sternotomy wires and postsurgical changes of CABG noted. There minimal bibasilar linear atelectasis/ scarring. No focal consolidation, pleural effusion, or pneumothorax. Overall improvement of the aeration of the lungs compared to prior radiograph. There is atherosclerotic calcification of the abdominal aorta and splenic artery. No acute osseous pathology. IMPRESSION: No active cardiopulmonary disease. Electronically Signed   By: Anner Crete M.D.   On: 01/20/2017 03:32    Procedures Procedures (including critical care time)  Medications Ordered in  ED Medications  amiodarone (PACERONE) tablet 200 mg (not administered)  ALPRAZolam (XANAX) tablet 0.25 mg (not administered)  aspirin EC tablet 81 mg (not administered)  carvedilol (COREG) tablet 6.25 mg (6.25 mg Oral Given 01/20/17 0830)  cinacalcet (SENSIPAR) tablet 30 mg (not administered)  cloNIDine (CATAPRES) tablet 0.2 mg (not administered)  fluticasone (FLONASE) 50 MCG/ACT nasal spray 2 spray (not administered)  sevelamer carbonate (RENVELA) tablet 800 mg (800 mg Oral Given 01/20/17 0830)  simvastatin (ZOCOR) tablet 20 mg (not administered)  ticagrelor (BRILINTA) tablet 90 mg (not administered)  loratadine (CLARITIN) tablet 10 mg (not administered)  multivitamin (RENA-VIT) tablet 1 tablet (not administered)  pantoprazole (PROTONIX) EC tablet 40 mg (not administered)  isosorbide mononitrate (IMDUR) 24 hr tablet 120 mg (120 mg Oral Given 01/20/17 0829)  hydrALAZINE (APRESOLINE) injection 5-10 mg (5 mg Intravenous Given 01/20/17 0654)  sodium chloride flush (NS) 0.9 % injection 3 mL (not administered)  acetaminophen (TYLENOL) tablet 650 mg (not administered)    Or  acetaminophen (TYLENOL) suppository 650 mg (not administered)  ondansetron (ZOFRAN) tablet 4 mg (not administered)    Or  ondansetron (ZOFRAN) injection 4 mg (not administered)  heparin injection 5,000 Units (5,000 Units Subcutaneous Given 01/20/17 5974)  hydrALAZINE (APRESOLINE) tablet 50 mg (50 mg Oral Given 01/20/17 0830)  nitroGLYCERIN (NITROSTAT) SL tablet 0.4 mg (0.4 mg Sublingual Given 01/20/17 0727)  aspirin chewable tablet 243 mg (243 mg Oral Given 01/20/17 0439)     Initial Impression / Assessment and Plan / ED Course  I have reviewed the triage vital signs and the nursing notes.  Pertinent labs & imaging results that were available during my care of the patient were reviewed by me and considered in my medical decision making (see chart for details).     Initial troponin negative. EKG unchanged. She is quite  hypertensive, likely contributing to CP. However without treatment BP went to 150s and now has almost no CP. Will give asa, nitro. She is comfortable appearing, and this is similar to prior CP/anginal episodes. Thus I think dissection is less likely. Doubt PE. Will admit to hospitalist given she is high risk for multiple risk factors and known CAD.  Final Clinical Impressions(s) / ED Diagnoses   Final diagnoses:  Nonspecific chest pain    New Prescriptions Current Discharge Medication List     I personally performed the services described in this documentation, which was scribed in my presence. The recorded information has been reviewed and is accurate.     Sherwood Gambler, MD 01/20/17 [phone]

## 2017-01-21 DIAGNOSIS — I10 Essential (primary) hypertension: Secondary | ICD-10-CM | POA: Diagnosis not present

## 2017-01-21 DIAGNOSIS — N186 End stage renal disease: Secondary | ICD-10-CM | POA: Diagnosis not present

## 2017-01-21 DIAGNOSIS — R079 Chest pain, unspecified: Secondary | ICD-10-CM

## 2017-01-21 DIAGNOSIS — Z992 Dependence on renal dialysis: Secondary | ICD-10-CM | POA: Diagnosis not present

## 2017-01-21 DIAGNOSIS — I160 Hypertensive urgency: Secondary | ICD-10-CM | POA: Diagnosis not present

## 2017-01-21 MED ORDER — ISOSORBIDE MONONITRATE ER 120 MG PO TB24: 120.0000 mg | ORAL_TABLET | Freq: Every day | ORAL | 2 refills | Status: DC

## 2017-01-21 NOTE — Discharge Instructions (Signed)
Follow with [name], MD in 5-7 days  Please get a complete blood count and chemistry panel checked by your Primary MD at your next visit, and again as instructed by your Primary MD. Please get your medications reviewed and adjusted by your Primary MD.  Please request your Primary MD to go over all Hospital Tests and Procedure/Radiological results at the follow up, please get all Hospital records sent to your Prim MD by signing hospital release before you go home.  If you had Pneumonia of Lung problems at the Hospital: Please get a 2 view Chest X ray done in 6-8 weeks after hospital discharge or sooner if instructed by your Primary MD.  If you have Congestive Heart Failure: Please call your Cardiologist or Primary MD anytime you have any of the following symptoms:  1) 3 pound weight gain in 24 hours or 5 pounds in 1 week  2) shortness of breath, with or without a dry hacking cough  3) swelling in the hands, feet or stomach  4) if you have to sleep on extra pillows at night in order to breathe  Follow cardiac low salt diet and 1.5 lit/day fluid restriction.  If you have diabetes Accuchecks 4 times/day, Once in AM empty stomach and then before each meal. Log in all results and show them to your primary doctor at your next visit. If any glucose reading is under 80 or above 300 call your primary MD immediately.  If you have Seizure/Convulsions/Epilepsy: Please do not drive, operate heavy machinery, participate in activities at heights or participate in high speed sports until you have seen by Primary MD or a Neurologist and advised to do so again.  If you had Gastrointestinal Bleeding: Please ask your Primary MD to check a complete blood count within one week of discharge or at your next visit. Your endoscopic/colonoscopic biopsies that are pending at the time of discharge, will also need to followed by your Primary MD.  Get Medicines reviewed and adjusted. Please take all your  medications with you for your next visit with your Primary MD  Please request your Primary MD to go over all hospital tests and procedure/radiological results at the follow up, please ask your Primary MD to get all Hospital records sent to his/her office.  If you experience worsening of your admission symptoms, develop shortness of breath, life threatening emergency, suicidal or homicidal thoughts you must seek medical attention immediately by calling 911 or calling your MD immediately  if symptoms less severe.  You must read complete instructions/literature along with all the possible adverse reactions/side effects for all the Medicines you take and that have been prescribed to you. Take any new Medicines after you have completely understood and accpet all the possible adverse reactions/side effects.   Do not drive or operate heavy machinery when taking Pain medications.   Do not take more than prescribed Pain, Sleep and Anxiety Medications  Special Instructions: If you have smoked or chewed Tobacco  in the last 2 yrs please stop smoking, stop any regular Alcohol  and or any Recreational drug use.  Wear Seat belts while driving.  Please note You were cared for by a hospitalist during your hospital stay. If you have any questions about your discharge medications or the care you received while you were in the hospital after you are discharged, you can call the unit and asked to speak with the hospitalist on call if the hospitalist that took care of you is not available. Once  you are discharged, your primary care physician will handle any further medical issues. Please note that NO REFILLS for any discharge medications will be authorized once you are discharged, as it is imperative that you return to your primary care physician (or establish a relationship with a primary care physician if you do not have one) for your aftercare needs so that they can reassess your need for medications and monitor your  lab values.  You can reach the hospitalist office at phone [phone] or fax [phone]   If you do not have a primary care physician, you can call [phone] for a physician referral.  Activity: As tolerated with Full fall precautions use walker/cane & assistance as needed  Diet: renal  Disposition Home

## 2017-01-21 NOTE — Discharge Summary (Signed)
Physician Discharge Summary  Shawna [name] HAL:[id number] DOB: Jul 23, 1940 DOA: 01/20/2017  PCP: [name], MD  Admit date: 01/20/2017 Discharge date: 01/21/2017  Admitted From: home Disposition:  home  Recommendations for Outpatient Follow-up:  1. Follow up with PCP in 1-2 weeks 2. Outpatient HD as scheduled  Home Health: none Equipment/Devices: none  Discharge Condition: stable CODE STATUS: Full code Diet recommendation: renal  HPI: Per Dr. Alcario Drought, Shawna [name] is a 76 y.o. female with medical history significant of CAD, CABG, recently had stent to diag for ISR in April of this year, ESRD dialysis on MWF.  Patient presents to the ED with c/o chest pain.  Of note her BPs have been running high since dialysis 2 days ago.  She notes she ran out of her Imdur 1 week ago and doesn't have any refills left on this med.  CP onset 3 hours ago, dull, radiates into arms.  ASA and NTG with some relief. ED Course: initial BP is 226/97.  Patient given SL NTG, BP goes all the way down to 735 systolic and her CP has resolved at this time. She has not missed any dialysis sessions; however, she did run out of her Imdur about a week ago and has no refills left on this med and so hasnt been able to take this.  Hospital Course: Discharge Diagnoses:  Principal Problem:   Hypertensive urgency Active Problems:   Atherosclerosis of native coronary artery of native heart without angina pectoris   ESRD on hemodialysis [facility])   Essential hypertension  Hypertensive urgency -patient was admitted to the hospital with hypertensive urgency and with systolic BP in the 329J. Her home regimen was restarted and her BP improved initially still in the 160-170s however BP in the 130s on discharge. Her chest pain resolved with improvement in her BP. Of note, patient states that Amlodipine is not on her home medication list and this was taken off. She has had mild troponin elevation which is likely demand ischemia and not  in a pattern consistent with ACS End-stage renal disease -She underwent dialysis 7/13, unfortunately she missed her blood pressure medications around lunchtime when she was on dialysis and had transient elevation during HD. Coronary artery disease -Chest pain in the setting of elevated BP, now resolved with improvement in her BP.  Cardiac enzymes 0.02, 0.05, 0.04, essentially flat   Discharge Instructions   Allergies as of 01/21/2017      Reactions   Aspirin Other (See Comments)   High Doses Mess up her stomach; "makes my bowels have blood in them". Takes 81 mg EC Aspirin    Penicillins Other (See Comments)   SYNCOPE? , "makes me real weak when I take it; like I'll pass out" Has patient had a PCN reaction causing immediate rash, facial/tongue/throat swelling, SOB or lightheadedness with hypotension: Yes Has patient had a PCN reaction causing severe rash involving mucus membranes or skin necrosis: no Has patient had a PCN reaction that required hospitalization no Has patient had a PCN reaction occurring within the last 10 years: no If all of the above   Bactrim [sulfamethoxazole-trimethoprim] Rash   Contrast Media [iodinated Diagnostic Agents] Itching   Iron Itching, Other (See Comments)   "they gave me iron in dialysis; had to give me Benadryl cause I had to have the iron" (05/02/2012)   Nitrofurantoin Hives   Prilosec [omeprazole] Other (See Comments)   "back spasms"   Tylenol [acetaminophen] Itching, Other (See Comments)   Makes her  feet on fire per pt   Dexilant [dexlansoprazole] Other (See Comments)   Upset stomach   Levaquin [levofloxacin In D5w] Rash   Morphine And Related Itching   Itching in feet   Plavix [clopidogrel Bisulfate] Rash   Protonix [pantoprazole Sodium] Rash   Venofer [ferric Oxide] Itching   Patient reports using Benadryl prior to doses as Mark      Medication List    STOP taking these medications   amLODipine 2.5 MG tablet Commonly known  as:  NORVASC     TAKE these medications   ALPRAZolam 0.25 MG tablet Commonly known as:  XANAX Take 0.25 mg by mouth 2 (two) times daily as needed for anxiety or sleep.   amiodarone 200 MG tablet Commonly known as:  PACERONE Take 1 tablet (200 mg total) by mouth daily.   aspirin EC 81 MG tablet Take 81 mg by mouth daily.   benzonatate 100 MG capsule Commonly known as:  TESSALON Take 1 capsule (100 mg total) by mouth every 8 (eight) hours. What changed:  when to take this  reasons to take this   carvedilol 6.25 MG tablet Commonly known as:  COREG Take 1 tablet (6.25 mg total) by mouth 2 (two) times daily with a meal.   cinacalcet 30 MG tablet Commonly known as:  SENSIPAR Take 30 mg by mouth daily after supper.   cloNIDine 0.2 MG tablet Commonly known as:  CATAPRES Take 1 tablet (0.2 mg total) by mouth 3 (three) times daily.   epoetin alfa 10000 UNIT/ML injection Commonly known as:  EPOGEN,PROCRIT Inject 1 mL (10,000 Units total) into the vein every Monday, Wednesday, and Friday with hemodialysis.   fluticasone 50 MCG/ACT nasal spray Commonly known as:  FLONASE Place 2 sprays into the nose at bedtime.   hydrALAZINE 50 MG tablet Commonly known as:  APRESOLINE Take 2 tablets (100 mg total) by mouth 3 (three) times daily. What changed:  when to take this  reasons to take this   isosorbide mononitrate 120 MG 24 hr tablet Commonly known as:  IMDUR Take 1 tablet (120 mg total) by mouth daily.   loratadine 10 MG tablet Commonly known as:  CLARITIN Take 10 mg by mouth daily.   multivitamin Tabs tablet Take 1 tablet by mouth daily.   nitroGLYCERIN 0.4 MG SL tablet Commonly known as:  NITROSTAT Place 1 tablet (0.4 mg total) under the tongue every 5 (five) minutes as needed for chest pain.   omeprazole 20 MG capsule Commonly known as:  PRILOSEC Take 20 mg by mouth daily.   ondansetron 4 MG disintegrating tablet Commonly known as:  ZOFRAN-ODT Take 4 mg by  mouth 2 (two) times daily as needed for nausea or vomiting.   sevelamer carbonate 800 MG tablet Commonly known as:  RENVELA Take 800 mg by mouth 3 (three) times daily with meals.   simvastatin 20 MG tablet Commonly known as:  ZOCOR Take 20 mg by mouth at bedtime.   ticagrelor 90 MG Tabs tablet Commonly known as:  BRILINTA Take 90 mg by mouth 2 (two) times daily.      Follow-up Information    [name], MD. Schedule an appointment as soon as possible for a visit in 1 week(s).   Specialty:  Family Medicine Contact information: [address] [place] [address] [phone]          Allergies  Allergen Reactions  . Aspirin Other (See Comments)    High Doses Mess up her  stomach; "makes my bowels have blood in them". Takes 81 mg EC Aspirin   . Penicillins Other (See Comments)    SYNCOPE? , "makes me real weak when I take it; like I'll pass out"  Has patient had a PCN reaction causing immediate rash, facial/tongue/throat swelling, SOB or lightheadedness with hypotension: Yes Has patient had a PCN reaction causing severe rash involving mucus membranes or skin necrosis: no Has patient had a PCN reaction that required hospitalization no Has patient had a PCN reaction occurring within the last 10 years: no If all of the above  . Bactrim [Sulfamethoxazole-Trimethoprim] Rash  . Contrast Media [Iodinated Diagnostic Agents] Itching  . Iron Itching and Other (See Comments)    "they gave me iron in dialysis; had to give me Benadryl cause I had to have the iron" (05/02/2012)  . Nitrofurantoin Hives  . Prilosec [Omeprazole] Other (See Comments)    "back spasms"  . Tylenol [Acetaminophen] Itching and Other (See Comments)    Makes her feet on fire per pt  . Dexilant [Dexlansoprazole] Other (See Comments)    Upset stomach  . Levaquin [Levofloxacin In D5w] Rash  . Morphine And Related Itching    Itching in feet  . Plavix [Clopidogrel Bisulfate] Rash  . Protonix [Pantoprazole  Sodium] Rash  . Venofer [Ferric Oxide] Itching    Patient reports using Benadryl prior to doses as [place]    Consultations:  Nephrology   Procedures/Studies:  Dg Chest 2 View  Result Date: 01/20/2017 CLINICAL DATA:  76 year old female with chest pain and palpitation. EXAM: CHEST  2 VIEW COMPARISON:  Chest radiograph dated 10/12/2016 FINDINGS: Right-sided dialysis catheter with tip over the right atrium in stable positioning. The cardiac silhouette is within normal limits. Median sternotomy wires and postsurgical changes of CABG noted. There minimal bibasilar linear atelectasis/ scarring. No focal consolidation, pleural effusion, or pneumothorax. Overall improvement of the aeration of the lungs compared to prior radiograph. There is atherosclerotic calcification of the abdominal aorta and splenic artery. No acute osseous pathology. IMPRESSION: No active cardiopulmonary disease. Electronically Signed   By: Anner Crete M.D.   On: 01/20/2017 03:32     Subjective: - no chest pain, shortness of breath, no abdominal pain, nausea or vomiting.   Discharge Exam: Vitals:   01/20/17 2239 01/21/17 0300  BP: 135/61 (!) 131/52  Pulse: 74 73  Resp: 20 17  Temp: 97.7 F (36.5 C) 98 F (36.7 C)   Vitals:   01/20/17 1712 01/20/17 1800 01/20/17 2239 01/21/17 0300  BP: (!) 179/91 (!) 208/81 135/61 (!) 131/52  Pulse: 80 75 74 73  Resp: 20  20 17   Temp: 98 F (36.7 C)  97.7 F (36.5 C) 98 F (36.7 C)  TempSrc: Oral  Oral Oral  SpO2: 99% 96% 97% 96%  Weight: 56.5 kg (124 lb 9 oz)   57.9 kg (127 lb 10.3 oz)  Height:        General: Pt is alert, awake, not in acute distress Cardiovascular: RRR, S1/S2 +, no rubs, no gallops Respiratory: CTA bilaterally, no wheezing, no rhonchi Abdominal: Soft, NT, ND, bowel sounds + Extremities: no edema, no cyanosis    The results of significant diagnostics from this hospitalization (including imaging, microbiology, ancillary and laboratory)  are listed below for reference.     Microbiology: No results found for this or any previous visit (from the past 240 hour(s)).   Labs: BNP (last 3 results)  Recent Labs  07/16/16 1517  BNP 372.0*  Basic Metabolic Panel:  Recent Labs Lab 01/20/17 0253  NA 136  K 4.3  CL 99*  CO2 25  GLUCOSE 103*  BUN 49*  CREATININE 7.53*  CALCIUM 9.1   Liver Function Tests: No results for input(s): AST, ALT, ALKPHOS, BILITOT, PROT, ALBUMIN in the last 168 hours. No results for input(s): LIPASE, AMYLASE in the last 168 hours. No results for input(s): AMMONIA in the last 168 hours. CBC:  Recent Labs Lab 01/20/17 0253  WBC 8.0  HGB 11.7*  HCT 37.4  MCV 100.3*  PLT 302   Cardiac Enzymes:  Recent Labs Lab 01/20/17 0609 01/20/17 1030  TROPONINI 0.05* 0.04*   BNP: Invalid input(s): POCBNP CBG: No results for input(s): GLUCAP in the last 168 hours. D-Dimer No results for input(s): DDIMER in the last 72 hours. Hgb A1c No results for input(s): HGBA1C in the last 72 hours. Lipid Profile No results for input(s): CHOL, HDL, LDLCALC, TRIG, CHOLHDL, LDLDIRECT in the last 72 hours. Thyroid function studies No results for input(s): TSH, T4TOTAL, T3FREE, THYROIDAB in the last 72 hours.  Invalid input(s): FREET3 Anemia work up No results for input(s): VITAMINB12, FOLATE, FERRITIN, TIBC, IRON, RETICCTPCT in the last 72 hours. Urinalysis    Component Value Date/Time   COLORURINE YELLOW 12/16/2012 1919   APPEARANCEUR CLOUDY (A) 12/16/2012 1919   LABSPEC 1.009 12/16/2012 1919   PHURINE 7.5 12/16/2012 1919   GLUCOSEU NEGATIVE 12/16/2012 1919   HGBUR TRACE (A) 12/16/2012 1919   BILIRUBINUR NEGATIVE 12/16/2012 1919   KETONESUR NEGATIVE 12/16/2012 1919   PROTEINUR 100 (A) 12/16/2012 1919   UROBILINOGEN 0.2 12/16/2012 1919   NITRITE NEGATIVE 12/16/2012 1919   LEUKOCYTESUR SMALL (A) 12/16/2012 1919   Sepsis Labs Invalid input(s): PROCALCITONIN,  WBC,   LACTICIDVEN Microbiology No results found for this or any previous visit (from the past 240 hour(s)).   Time coordinating discharge: 25 minutes  SIGNED:  Marzetta Board, MD  [facility] 01/21/2017, 4:15 PM Pager [phone]  If 7PM-7AM, please contact night-coverage [email or web address] Password [email or web address]

## 2017-01-23 DIAGNOSIS — N186 End stage renal disease: Secondary | ICD-10-CM | POA: Diagnosis not present

## 2017-01-23 DIAGNOSIS — Z992 Dependence on renal dialysis: Secondary | ICD-10-CM | POA: Diagnosis not present

## 2017-01-24 ENCOUNTER — Encounter
Admission: RE | Admit: 2017-01-24 | Discharge: 2017-01-24 | Disposition: A | Discharge status: Home / Self Care | Payer: Medicare HMO | Source: Ambulatory Visit | Attending: Vascular Surgery | Admitting: Vascular Surgery

## 2017-01-24 ENCOUNTER — Other Ambulatory Visit (INDEPENDENT_AMBULATORY_CARE_PROVIDER_SITE_OTHER): Payer: Self-pay

## 2017-01-24 DIAGNOSIS — Z01812 Encounter for preprocedural laboratory examination: Secondary | ICD-10-CM | POA: Insufficient documentation

## 2017-01-24 LAB — COMPREHENSIVE METABOLIC PANEL
ALBUMIN: 3.9 g/dL (ref 3.5–5.0)
ALT: 13 U/L — ABNORMAL LOW (ref 14–54)
ANION GAP: 15 (ref 5–15)
AST: 22 U/L (ref 15–41)
Alkaline Phosphatase: 119 U/L (ref 38–126)
BUN: 42 mg/dL — ABNORMAL HIGH (ref 6–20)
CO2: 26 mmol/L (ref 22–32)
Calcium: 8.7 mg/dL — ABNORMAL LOW (ref 8.9–10.3)
Chloride: 97 mmol/L — ABNORMAL LOW (ref 101–111)
Creatinine, Ser: 7.27 mg/dL — ABNORMAL HIGH (ref 0.44–1.00)
GFR calc non Af Amer: 5 mL/min — ABNORMAL LOW (ref >60)
GFR, EST AFRICAN AMERICAN: 6 mL/min — AB (ref >60)
GLUCOSE: 84 mg/dL (ref 65–99)
POTASSIUM: 4.4 mmol/L (ref 3.5–5.1)
SODIUM: 138 mmol/L (ref 135–145)
Total Bilirubin: 0.7 mg/dL (ref 0.3–1.2)
Total Protein: 7.3 g/dL (ref 6.5–8.1)

## 2017-01-24 LAB — CBC
HCT: 37 % (ref 35.0–47.0)
Hemoglobin: 12 g/dL (ref 12.0–16.0)
MCH: 31.6 pg (ref 26.0–34.0)
MCHC: 32.4 g/dL (ref 32.0–36.0)
MCV: 97.5 fL (ref 80.0–100.0)
Platelets: 281 10*3/uL (ref 150–440)
RBC: 3.8 MIL/uL (ref 3.80–5.20)
RDW: 17.2 % — AB (ref 11.5–14.5)
WBC: 6.5 10*3/uL (ref 3.6–11.0)

## 2017-01-24 LAB — APTT: APTT: 34 s (ref 24–36)

## 2017-01-24 LAB — SURGICAL PCR SCREEN
MRSA, PCR: NEGATIVE
STAPHYLOCOCCUS AUREUS: NEGATIVE

## 2017-01-24 LAB — PROTIME-INR
INR: 1.05
Prothrombin Time: 13.7 seconds (ref 11.4–15.2)

## 2017-01-24 IMAGING — US US EXTREM  UP VENOUS*R*
1 series · 13 of 24 positions shown · non-contrast
Comparison: [DATE]

CLINICAL DATA: Right hand swelling, redness and pain. Recent
imaging demonstrating superficial thrombophlebitis within the right
basilic vein.

EXAM:
RIGHT UPPER EXTREMITY VENOUS DOPPLER ULTRASOUND
TECHNIQUE: Gray-scale sonography with graded compression, as well as color
Doppler and duplex ultrasound were performed to evaluate the upper
extremity deep venous system from the level of the subclavian vein
and including the jugular, axillary, basilic, radial, ulnar and
upper cephalic vein. Spectral Doppler was utilized to evaluate flow
at rest and with distal augmentation maneuvers.

[Series 1: us extrem up venous*right* · 0.06mm/px · 13 of 44 slices shown]
[im 1/44]
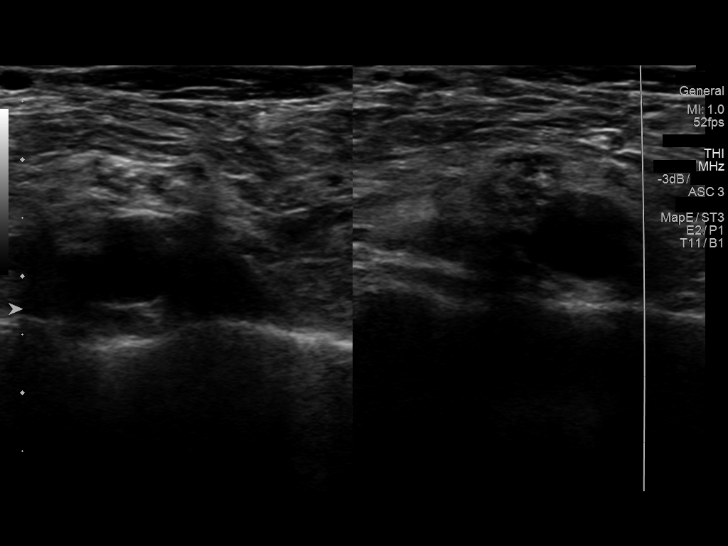
[im 4/44]
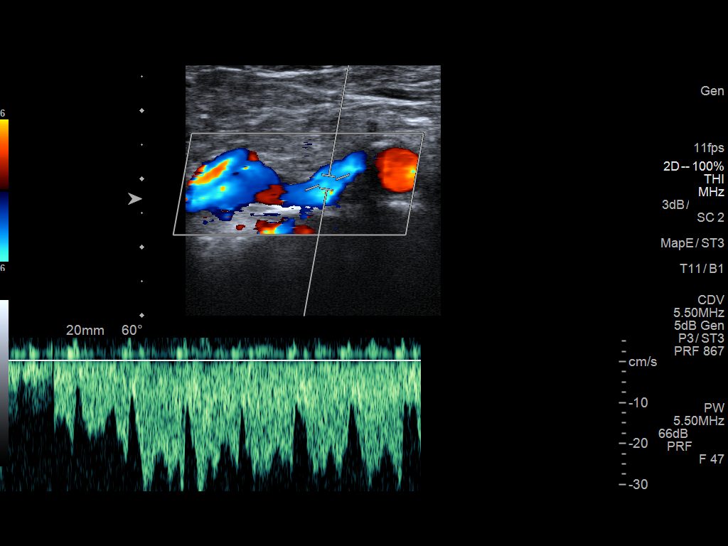
[im 8/44]
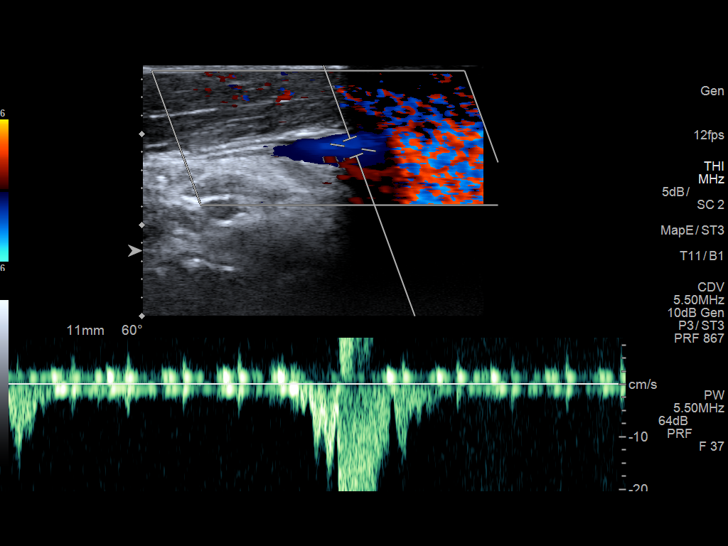
[im 12/44]
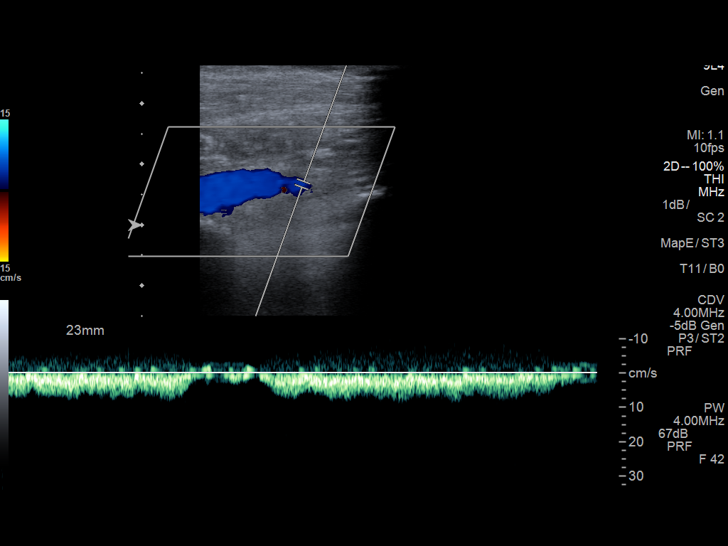
[im 15/44]
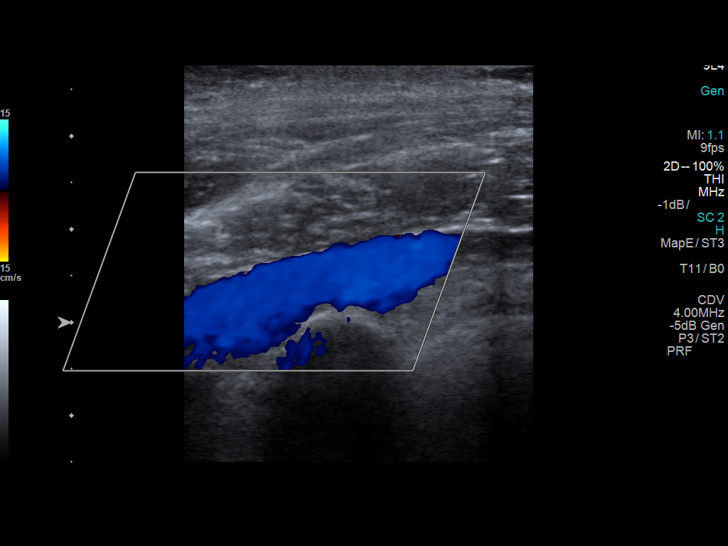
[im 19/44]
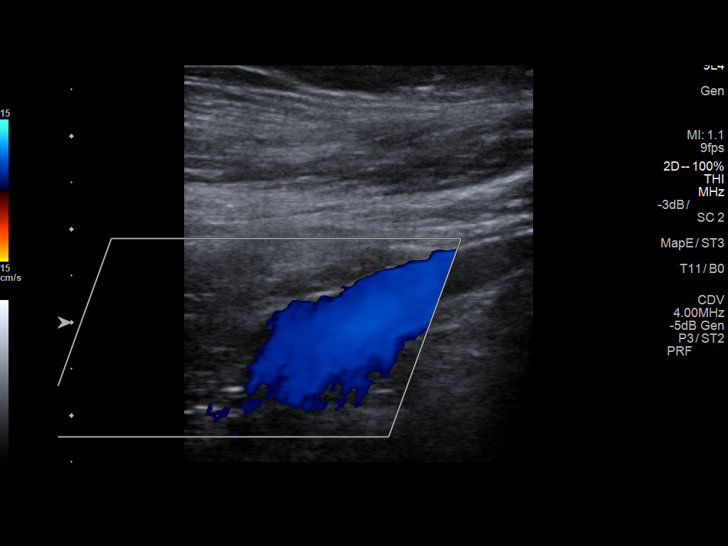
[im 23/44]
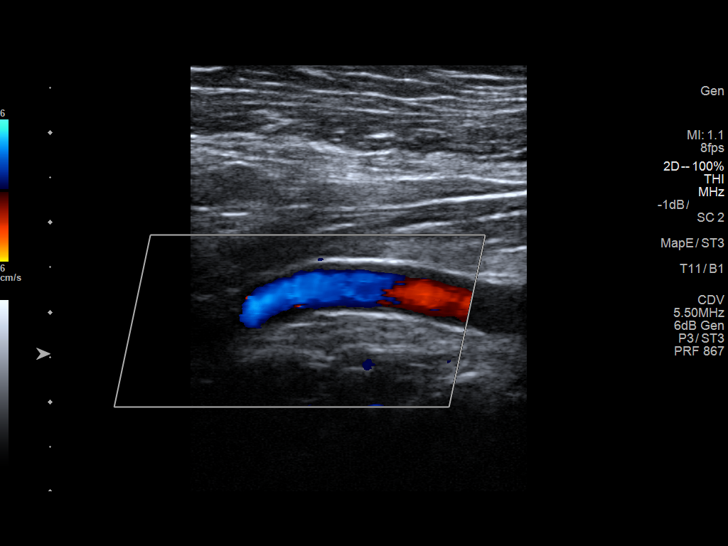
[im 25/44]
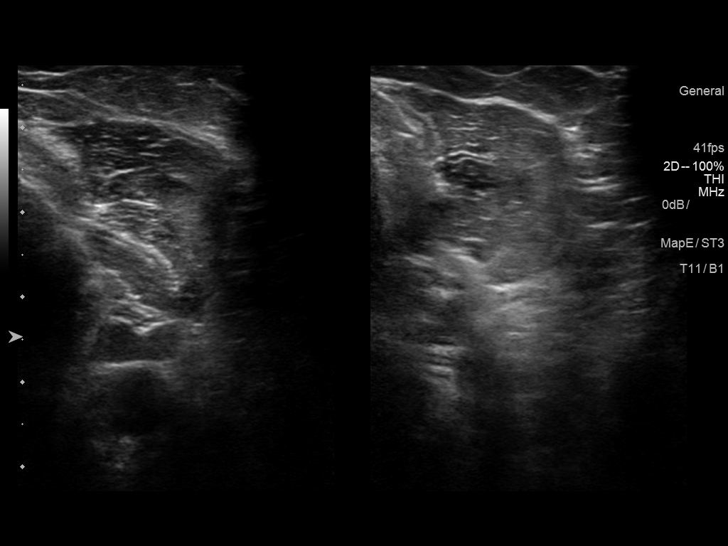
[im 29/44]
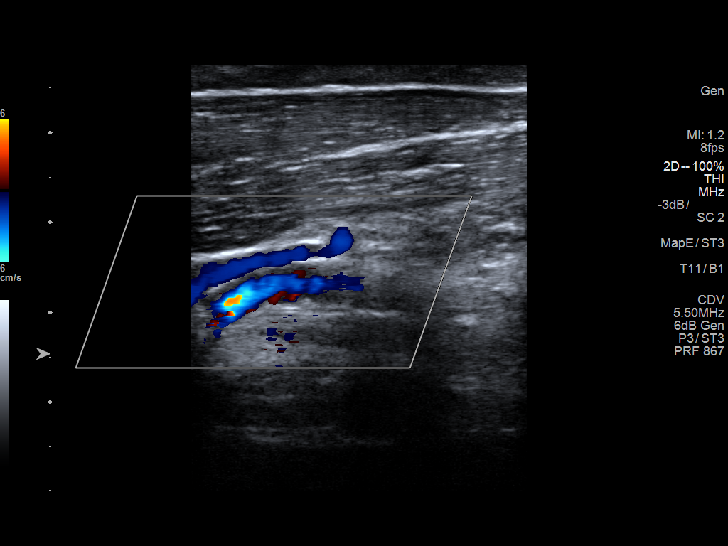
[im 32/44]
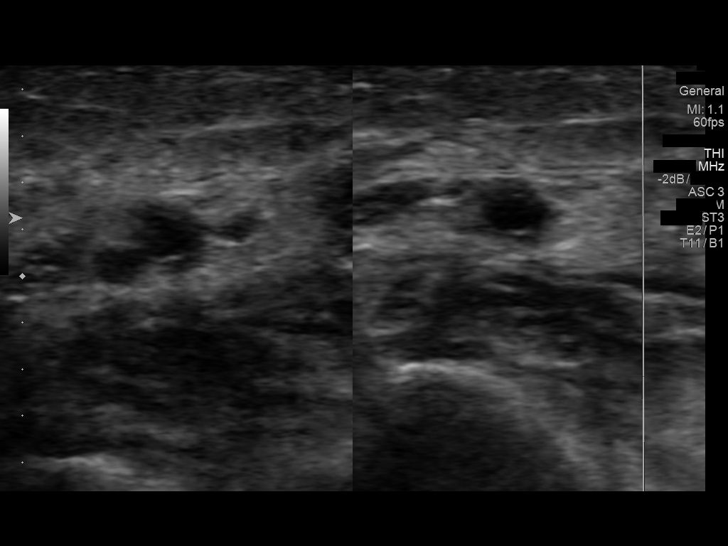
[im 36/44]
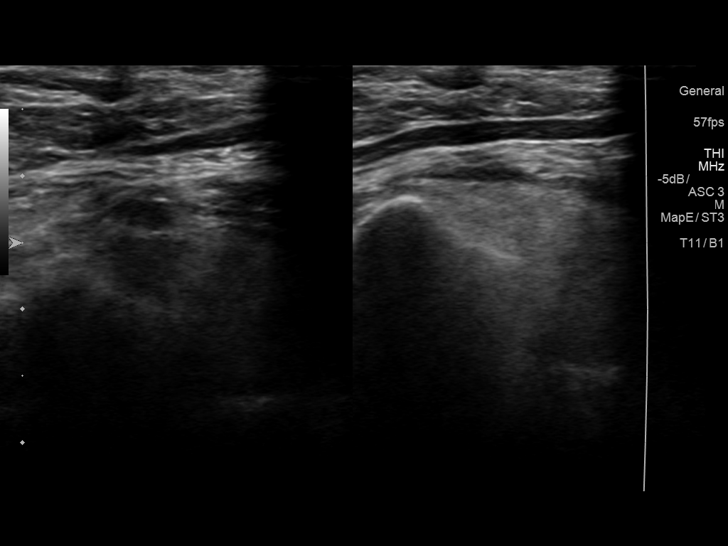
[im 40/44]
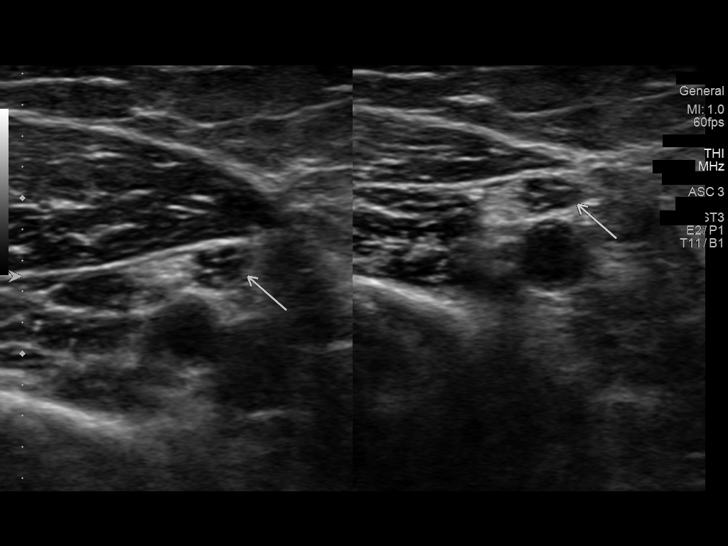
[im 44/44]
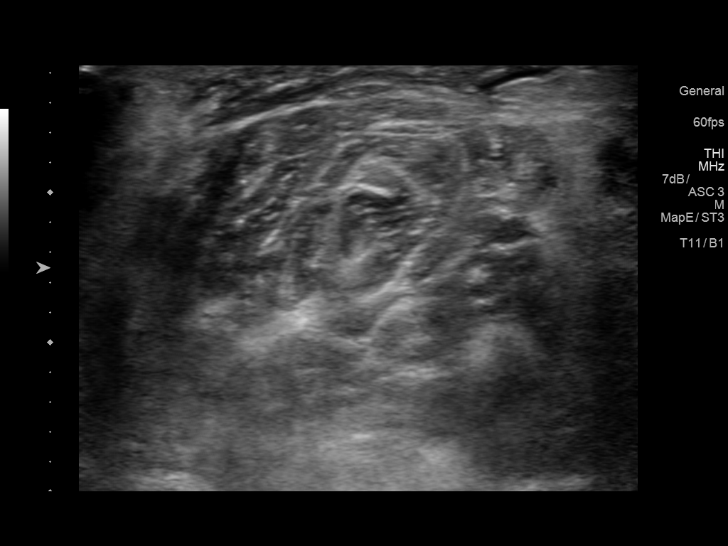

[13 of 24 positions shown; findings below may reference images not displayed]

FINDINGS: Contralateral Subclavian Vein: Respiratory phasicity is normal and
symmetric with the symptomatic side. No evidence of thrombus. Normal
compressibility.

Internal Jugular Vein: No evidence of thrombus. Normal
compressibility, respiratory phasicity and response to augmentation.

Subclavian Vein: No evidence of thrombus. Normal compressibility,
respiratory phasicity and response to augmentation.

Axillary Vein: No evidence of thrombus. Normal compressibility,
respiratory phasicity and response to augmentation.

Cephalic Vein: No evidence of thrombus. Normal compressibility,
respiratory phasicity and response to augmentation.

Basilic Vein: Stable evidence of echogenic thrombus in a
nondistended basilic vein. Overall appearance is more suggestive of
chronic thrombus and may relate to prior catheter placement such as
a PICC line. Given history of end-stage renal disease, this could
also relate to chronic occlusion post prior surgical dialysis
access.

Brachial Veins: No evidence of thrombus. Normal compressibility,
respiratory phasicity and response to augmentation.

Radial Veins: No evidence of thrombus. Normal compressibility,
respiratory phasicity and response to augmentation.

Ulnar Veins: No evidence of thrombus. Normal compressibility,
respiratory phasicity and response to augmentation.

Venous Reflux:  None visualized.

Other Findings:  None visualized.
IMPRESSION: Stable appearance of thrombus in the right basilic vein. Based on
sonographic appearance, this is favored to more likely represent
chronic thrombus rather than acute thrombus. This could relate to
prior catheter placement or dialysis access.

## 2017-01-24 NOTE — Patient Instructions (Addendum)
Your procedure is scheduled on: Wednesday, February 01, 2017 Report to Same Day Surgery on the 2nd floor in the [facility]. To find out your arrival time, please call [phone] between 1PM - 3PM on: Tuesday, January 31, 2017  REMEMBER: Instructions that are not followed completely may result in serious medical risk up to and including death; or upon the discretion of your surgeon and anesthesiologist your surgery may need to be rescheduled.  Do not eat food or drink liquids after midnight. No gum chewing or hard candies  No Alcohol for 24 hours before or after surgery.  No Smoking for 24 hours prior to surgery.  Notify your doctor if there is any change in your medical condition (cold, fever, infection).  Do not wear jewelry, make-up, hairpins, clips or nail polish.  Do not wear lotions, powders, or perfumes.   Do not shave 48 hours prior to surgery.   Contacts and dentures may not be worn into surgery.  Do not bring valuables to the hospital. [facility] is not responsible for any belongings or valuables.   TAKE THESE MEDICATIONS THE MORNING OF SURGERY WITH A SIP OF WATER:  1.  Amiodarone 2.  Carvedilol 3.  Clonidine 4.  Isosorbide mononitrate 5.  Omeprazole (take one capsule the night before surgery and one capsule the morning of surgery)  Use CHG Soap or wipes as directed on instruction sheet.   Follow recommendations from DR. SCHNIER regarding stopping BRILINTA. (CONTINUE ASPIRIN 81 MG)  Stop Anti-inflammatories such as Advil, Aleve, Ibuprofen, Motrin, Naproxen, Naprosyn, Goodie powder, or aspirin products. (May take Tylenol or Acetaminophen if needed.)  Stop supplements until after surgery. (May continue multivitamin.)  If you are being admitted to the hospital overnight, leave your suitcase in the car. After surgery it may be brought to your room.  If you are being discharged the day of surgery, you will not be allowed to drive home. You will need someone to drive  you home and stay with you that night.   If you are taking public transportation, you will need to have a responsible adult to with you.

## 2017-01-24 NOTE — Pre-Procedure Instructions (Signed)
Discussed with Dr. Ronelle Nigh (anesthesiologist) patient's recent ED admission for chest pain, EKG and past cardiac history. Request for patient to see her cardiologist (Dr. Bronson Ing) prior to having surgery. After rearranging appointments, the patient is scheduled with the cardiology PA tomorrow, 01/25/17 at 11:30 am. Clearance form faxed to Dr. Court Joy office. Patient dialysis was rescheduled for Thursday to accommodate cardiology appointment. Patient and husband informed of these appointment changes and acknowledged understanding.

## 2017-01-25 ENCOUNTER — Encounter: Payer: Self-pay | Admitting: Physician Assistant

## 2017-01-25 ENCOUNTER — Ambulatory Visit (INDEPENDENT_AMBULATORY_CARE_PROVIDER_SITE_OTHER): Payer: Medicare HMO | Admitting: Physician Assistant

## 2017-01-25 VITALS — BP 142/70 | HR 74 | Ht 62.0 in | Wt 127.0 lb

## 2017-01-25 DIAGNOSIS — N186 End stage renal disease: Secondary | ICD-10-CM

## 2017-01-25 DIAGNOSIS — I5032 Chronic diastolic (congestive) heart failure: Secondary | ICD-10-CM

## 2017-01-25 DIAGNOSIS — I2510 Atherosclerotic heart disease of native coronary artery without angina pectoris: Secondary | ICD-10-CM

## 2017-01-25 DIAGNOSIS — I10 Essential (primary) hypertension: Secondary | ICD-10-CM

## 2017-01-25 DIAGNOSIS — Z01818 Encounter for other preprocedural examination: Secondary | ICD-10-CM | POA: Diagnosis not present

## 2017-01-25 DIAGNOSIS — Z992 Dependence on renal dialysis: Secondary | ICD-10-CM | POA: Diagnosis not present

## 2017-01-25 NOTE — Pre-Procedure Instructions (Signed)
Met B sent to Dr. Schnier and Anesthesia for review. 

## 2017-01-25 NOTE — Patient Instructions (Signed)
Your physician recommends that you schedule a follow-up appointment in: 2-3 Months with Dr. Bronson Ing   Your physician recommends that you continue on your current medications as directed. Please refer to the Current Medication list given to you today.  You may have your surgery but may not stop Brilinta   If you need a refill on your cardiac medications before your next appointment, please call your pharmacy.  Thank you for choosing [facility]!

## 2017-01-25 NOTE — Progress Notes (Signed)
Cardiology Office Note    Date:  01/25/2017   ID:  Shawna [name], DOB 1940/09/04, MRN [id number]  PCP:  [name], MD  Cardiologist: Dr. Bronson Ing  Chief Complaint  Patient presents with  . Pre-op Exam    History of Present Illness:  Shawna [name] is a 76 y.o. female with history of CAD status post NSTEMI 12/2011 DES to the circumflex and diagonal 1, 01/2012 DES to the RCA, subsequent CABG. Recent NSTEMI 09/2016 LHC-tried medical therapy, NSTEMI 10/13/2016 overlapping DES Diagonal with residual severe multivessel native CAD, patent LIMA to the mid distal LAD, patent SVG to ramus intermediate vessel with 30% narrowing just beyond anastomosis, patent SVG to the distal RCA. LVEF 50-55% on 2-D echo 10/12/16.  Patient was just charged from the hospital 01/21/17 after an admission with elevated blood pressures of 226/97 after running out of her Imdur. She also had chest pain relieved with sublingual nitroglycerin. Cardiac enzymes were flat. We were not consult during this admission.  Patient comes in today for cardiac clearance before undergoing AV fistula revision for dialysis. She has been receiving her dialysis via port because her fistula is not working. She says they want her to hold her Brilinta for 3 days. She said she had chest pain when her blood pressure was elevated at hospitalization but overall has not been having any angina since her most recent stent. She remains fairly active and denies chest pain, palpitations, dyspnea, dyspnea on exertion, dizziness or presyncope.  Patient also has history of GI bleed secondary to AVMs, PAF with CHADSVASC=6 not anticoagulated due to GI bleeding and anemia, chronic diastolic heart failure, hypertension, ovarian and colon cancers.   Past Medical History:  Diagnosis Date  . Acute on chronic respiratory failure with hypoxia ([place]) 10/10/2016  . Anxiety   . Arthritis   . AVM (arteriovenous malformation) of colon   . CAD (coronary artery disease)    a. 12/2011 NSTEMI/Cath/PCI LCX (2.25x14 Resolute DES) & D1 (2.25x22 Resolute DES);  b. 01/2012 Cath/PCI: LM 30, LAD 30p, 40-73m, D1 stent ok, 99 in sm branch of diag, LCX patent stent, OM1 20, RCA 95 ost (4.0x12 Promus DES), EF 55%;  c. 04/2012 Lexi Cardiolite  EF 48%, small area of scar @ base/mid inflat wall with mild peri-infarct ischemia.; CABG 12/4  . Carotid artery disease ([place])    a. 24-40% LICA, 07/270   . Chronic bronchitis ([place])   . Chronic diastolic CHF (congestive heart failure) ([place])    a. 02/2012 Echo EF 60-65%, nl wall motion, Gr 1 DD, mod MR  . Colon cancer ([place]) 1992  . Esophageal stricture   . ESRD on hemodialysis ([place])    ESRD due to HTN, started dialysis 2011 and gets HD at [facility] with Dr Hinda Lenis on MWF schedule.  Access is LUA AVF as of Sept 2014.   Marland Kitchen GERD (gastroesophageal reflux disease)   . High cholesterol 12/2011  . History of blood transfusion 07/2011; 12/2011; 01/2012 X 2; 04/2012  . History of gout   . History of lower GI bleeding   . Hypertension   . Iron deficiency anemia   . Mitral regurgitation    a. Moderate by echo, 02/2012  . Myocardial infarction ([place])   . Ovarian cancer ([place]) 1992  . Pneumonia ~ 2009  . PUD (peptic ulcer disease)   . TIA (transient ischemic attack)     Past Surgical History:  Procedure Laterality Date  . ABDOMINAL HYSTERECTOMY  1992  . APPENDECTOMY  06/1990  . AV FISTULA PLACEMENT  07/2009   left upper arm  . AV FISTULA PLACEMENT Right 09/06/2016   Procedure: RIGHT FOREARM ARTERIOVENOUS (AV) GRAFT;  Surgeon: Elam Dutch, MD;  Location: [place];  Service: Vascular;  Laterality: Right;  . [place] REMOVAL Right 09/06/2016   Procedure: REMOVAL OF Right Arm ARTERIOVENOUS GORETEX GRAFT and Vein Patch angioplasty of brachial artery;  Surgeon: Angelia Mould, MD;  Location: [place];  Service: Vascular;  Laterality: Right;  . COLON RESECTION  1992  . COLON SURGERY    . CORONARY ANGIOPLASTY WITH STENT PLACEMENT  12/15/11   "2"  .  CORONARY ANGIOPLASTY WITH STENT PLACEMENT  y/2013   "1; makes total of 3" (05/02/2012)  . CORONARY ARTERY BYPASS GRAFT  06/13/2012   Procedure: CORONARY ARTERY BYPASS GRAFTING (CABG);  Surgeon: Grace Isaac, MD;  Location: [facility];  Service: Open Heart Surgery;  Laterality: N/A;  cabg x four;  using left internal mammary artery, and left leg greater saphenous vein harvested endoscopically  . CORONARY STENT INTERVENTION N/A 10/13/2016   Procedure: Coronary Stent Intervention;  Surgeon: Troy Sine, MD;  Location: [place] CV LAB;  Service: Cardiovascular;  Laterality: N/A;  . DILATION AND CURETTAGE OF UTERUS    . ESOPHAGOGASTRODUODENOSCOPY  01/20/2012   Procedure: ESOPHAGOGASTRODUODENOSCOPY (EGD);  Surgeon: Ladene Artist, MD,FACG;  Location: [facility] ENDOSCOPY;  Service: Endoscopy;  Laterality: N/A;  . ESOPHAGOGASTRODUODENOSCOPY N/A 03/26/2013   Procedure: ESOPHAGOGASTRODUODENOSCOPY (EGD);  Surgeon: Irene Shipper, MD;  Location: [facility] ENDOSCOPY;  Service: Endoscopy;  Laterality: N/A;  . ESOPHAGOGASTRODUODENOSCOPY N/A 04/30/2015   Procedure: ESOPHAGOGASTRODUODENOSCOPY (EGD);  Surgeon: Rogene Houston, MD;  [facility];  Service: Endoscopy;  Laterality: N/A;  1pm - moved to 10/20 @ 1:10  . ESOPHAGOGASTRODUODENOSCOPY N/A 07/29/2016   Procedure: ESOPHAGOGASTRODUODENOSCOPY (EGD);  Surgeon: Manus Gunning, MD;  Location: [facility];  Service: Gastroenterology;  Laterality: N/A;  enteroscopy  . INTRAOPERATIVE TRANSESOPHAGEAL ECHOCARDIOGRAM  06/13/2012   Procedure: INTRAOPERATIVE TRANSESOPHAGEAL ECHOCARDIOGRAM;  Surgeon: Grace Isaac, MD;  Location: [facility];  Service: Open Heart Surgery;  Laterality: N/A;  . IR GENERIC HISTORICAL  07/26/2016   IR FLUORO GUIDE CV LINE RIGHT 07/26/2016 Greggory Keen, MD [facility]INTER[facility]  . IR GENERIC HISTORICAL  07/26/2016   IR US GUIDE VASC ACCESS RIGHT 07/26/2016 Greggory Keen, MD [facility]  . IR GENERIC HISTORICAL  08/02/2016   IR US GUIDE VASC ACCESS  RIGHT 08/02/2016 Greggory Keen, MD [facility]  . IR GENERIC HISTORICAL  08/02/2016   IR FLUORO GUIDE CV LINE RIGHT 08/02/2016 Greggory Keen, MD [facility]  . LEFT HEART CATH AND CORONARY ANGIOGRAPHY N/A 09/20/2016   Procedure: Left Heart Cath and Coronary Angiography;  Surgeon: [name], MD;  Location: [name] CV LAB;  Service: Cardiovascular;  Laterality: N/A;  . LEFT HEART CATH AND CORS/GRAFTS ANGIOGRAPHY N/A 10/13/2016   Procedure: Left Heart Cath and Cors/Grafts Angiography;  Surgeon: Troy Sine, MD;  Location: [place] CV LAB;  Service: Cardiovascular;  Laterality: N/A;  . LEFT HEART CATHETERIZATION WITH CORONARY ANGIOGRAM N/A 12/15/2011   Procedure: LEFT HEART CATHETERIZATION WITH CORONARY ANGIOGRAM;  Surgeon: Burnell Blanks, MD;  Location: [facility] CATH LAB;  Service: Cardiovascular;  Laterality: N/A;  . LEFT HEART CATHETERIZATION WITH CORONARY ANGIOGRAM N/A 01/10/2012   Procedure: LEFT HEART CATHETERIZATION WITH CORONARY ANGIOGRAM;  Surgeon: Peter M Martinique, MD;  Location: [facility] C[facility] LAB;  Service: Cardiovascular;  Laterality: N/A;  . LEFT HEART CATHETERIZATION WITH  CORONARY ANGIOGRAM N/A 06/08/2012   Procedure: LEFT HEART CATHETERIZATION WITH CORONARY ANGIOGRAM;  Surgeon: Burnell Blanks, MD;  Location: [facility] CATH LAB;  Service: Cardiovascular;  Laterality: N/A;  . LEFT HEART CATHETERIZATION WITH CORONARY/GRAFT ANGIOGRAM N/A 12/10/2013   Procedure: LEFT HEART CATHETERIZATION WITH Beatrix Fetters;  Surgeon: [name], MD;  Location: [facility] CATH LAB;  Service: Cardiovascular;  Laterality: N/A;  . OVARY SURGERY     ovarian cancer  . SHUNTOGRAM N/A 10/15/2013   Procedure: Fistulogram;  Surgeon: Serafina Mitchell, MD;  Location: [facility] CATH LAB;  Service: Cardiovascular;  Laterality: N/A;  . THROMBECTOMY / ARTERIOVENOUS GRAFT REVISION  2011   left upper arm  . TUBAL LIGATION  1980's  . UPPER EXTREMITY ANGIOGRAPHY Bilateral 12/06/2016   Procedure: Upper Extremity  Angiography;  Surgeon: Katha Cabal, MD;  Location: [place] CV LAB;  Service: Cardiovascular;  Laterality: Bilateral;    Current Medications: Current Meds  Medication Sig  . ALPRAZolam (XANAX) 0.25 MG tablet Take 0.25 mg by mouth 2 (two) times daily as needed for anxiety or sleep.   Marland Kitchen amiodarone (PACERONE) 200 MG tablet Take 1 tablet (200 mg total) by mouth daily.  Marland Kitchen aspirin EC 81 MG tablet Take 81 mg by mouth daily.   . benzonatate (TESSALON) 100 MG capsule Take 1 capsule (100 mg total) by mouth every 8 (eight) hours. (Patient taking differently: Take 100 mg by mouth 3 (three) times daily as needed for cough. )  . carvedilol (COREG) 6.25 MG tablet Take 1 tablet (6.25 mg total) by mouth 2 (two) times daily with a meal.  . cinacalcet (SENSIPAR) 30 MG tablet Take 30 mg by mouth daily after supper.  . cloNIDine (CATAPRES) 0.2 MG tablet Take 1 tablet (0.2 mg total) by mouth 3 (three) times daily.  Marland Kitchen epoetin alfa (EPOGEN,PROCRIT) 40981 UNIT/ML injection Inject 1 mL (10,000 Units total) into the vein every Monday, Wednesday, and Friday with hemodialysis.  . fluticasone (FLONASE) 50 MCG/ACT nasal spray Place 2 sprays into the nose at bedtime as needed for allergies.   . hydrALAZINE (APRESOLINE) 50 MG tablet Take 2 tablets (100 mg total) by mouth 3 (three) times daily. (Patient taking differently: Take 100 mg by mouth 3 (three) times daily as needed (for high blood pressure readings). )  . isosorbide mononitrate (IMDUR) 120 MG 24 hr tablet Take 1 tablet (120 mg total) by mouth daily.  Marland Kitchen loratadine (CLARITIN) 10 MG tablet Take 10 mg by mouth daily.   . multivitamin (RENA-VIT) TABS tablet Take 1 tablet by mouth daily.  . nitroGLYCERIN (NITROSTAT) 0.4 MG SL tablet Place 1 tablet (0.4 mg total) under the tongue every 5 (five) minutes as needed for chest pain.  Marland Kitchen omeprazole (PRILOSEC) 20 MG capsule Take 20 mg by mouth daily.   . ondansetron (ZOFRAN-ODT) 4 MG disintegrating tablet Take 4 mg by  mouth 2 (two) times daily as needed for nausea or vomiting.  . sevelamer carbonate (RENVELA) 800 MG tablet Take 800 mg by mouth 3 (three) times daily with meals.  . simvastatin (ZOCOR) 20 MG tablet Take 20 mg by mouth at bedtime.  . ticagrelor (BRILINTA) 90 MG TABS tablet Take 90 mg by mouth 2 (two) times daily.     Allergies:   Aspirin; Penicillins; Bactrim [sulfamethoxazole-trimethoprim]; Contrast media [iodinated diagnostic agents]; Iron; Nitrofurantoin; Prilosec [omeprazole]; Tylenol [acetaminophen]; Dexilant [dexlansoprazole]; Levaquin [levofloxacin in d5w]; Morphine and related; Plavix [clopidogrel bisulfate]; Protonix [pantoprazole sodium]; and Venofer [ferric oxide]   Social History   Social  History  . Marital status: Married    Spouse name: N/A  . Number of children: N/A  . Years of education: N/A   Social History Main Topics  . Smoking status: Never Smoker  . Smokeless tobacco: Never Used  . Alcohol use No  . Drug use: No  . Sexual activity: Yes    Birth control/ protection: Surgical   Other Topics Concern  . None   Social History Narrative   Lives in [place], [place] with husband.  Dialysis pt - [facility].     Family History:  The patient's family history includes Diabetes in her brother, father, mother, and sister; Heart attack in her brother, brother, and mother; Heart disease in her father and mother; Hyperlipidemia in her brother, father, and mother; Hypertension in her father, mother, sister, and sister; Other in her unknown relative.   ROS:   Please see the history of present illness.    Review of Systems  Constitution: Positive for weakness and malaise/fatigue.  HENT: Negative.   Eyes: Negative.   Cardiovascular: Negative.   Respiratory: Positive for cough.   Hematologic/Lymphatic: Negative.   Musculoskeletal: Negative.  Negative for joint pain.  Gastrointestinal: Negative.   Genitourinary: Negative.   Neurological: Positive for headaches.  Psychiatric/Behavioral:  The patient is nervous/anxious.    All other systems reviewed and are negative.   PHYSICAL EXAM:   VS:  BP (!) 142/70   Pulse 74   Ht 5\' 2"  (1.575 m)   Wt 127 lb (57.6 kg)   SpO2 98%   BMI 23.23 kg/m   Physical Exam  GEN: Well nourished, well developed, in no acute distress  Neck: no JVD, carotid bruits, or masses Cardiac:RRR; 2/6 systolic murmur at left sternal border Respiratory:  clear to auscultation bilaterally, normal work of breathing GI: soft, nontender, nondistended, + BS Ext: Large AV fistula left upper extremity, lower extremities without cyanosis, clubbing, or edema, Good distal pulses bilaterally Neuro:  Alert and Oriented x 3 Psych: euthymic mood, full affect  Wt Readings from Last 3 Encounters:  01/25/17 127 lb (57.6 kg)  01/24/17 127 lb (57.6 kg)  01/21/17 127 lb 10.3 oz (57.9 kg)      Studies/Labs Reviewed:   EKG:  EKG is not ordered today.  The ekg reviewed from 01/21/17 showed normal sinus rhythm with LVH and repolarization changes, no acute change from prior tracings  Recent Labs: 05/19/2016: TSH 1.536 07/16/2016: B Natriuretic Peptide 372.0 10/12/2016: Magnesium 1.7 01/24/2017: ALT 13; BUN 42; Creatinine, Ser 7.27; Hemoglobin 12.0; Platelets 281; Potassium 4.4; Sodium 138   Lipid Panel    Component Value Date/Time   CHOL 169 09/20/2016 0347   TRIG 76 09/20/2016 0347   HDL 70 09/20/2016 0347   CHOLHDL 2.4 09/20/2016 0347   VLDL 15 09/20/2016 0347   LDLCALC 84 09/20/2016 0347    Additional studies/ records that were reviewed today include:  2-D echo 4/4/18Study Conclusions   - Left ventricle: The cavity size was normal. There was moderate   concentric hypertrophy. Systolic function was normal. The   estimated ejection fraction was in the range of 50% to 55%. Wall   motion was normal; there were no regional wall motion   abnormalities. Doppler parameters are consistent with abnormal   left ventricular relaxation (grade 1 diastolic dysfunction).    Doppler parameters are consistent with elevated ventricular   end-diastolic filling pressure. - Aortic valve: Trileaflet; normal thickness leaflets. There was no   regurgitation. - Aortic root: The aortic root was  normal in size. - Mitral valve: Calcified annulus. Mildly thickened leaflets .   There was mild regurgitation. - Left atrium: The atrium was normal in size. - Right ventricle: Systolic function was normal. - Tricuspid valve: There was no regurgitation. - Pulmonary arteries: Systolic pressure could not be accurately   estimated. - Inferior vena cava: The vessel was normal in size. - Pericardium, extracardiac: There was no pericardial effusion.   Cardiac cath 4/5/18Conclusion     Ramus lesion, 90 %stenosed.  Dist LAD lesion, 80 %stenosed.  LIMA.  SVG.  Ost RCA to Prox RCA lesion, 100 %stenosed.  SVG.  Ost Cx to Prox Cx lesion, 90 %stenosed.  Mid LAD lesion, 75 %stenosed.  A STENT RESOLUTE ONYX 6.16W73 drug eluting stent was successfully placed, and overlaps previously placed stent.  Ost 1st Diag to 1st Diag lesion, 99 %stenosed.  Post intervention, there is a 0% residual stenosis.  1st Diag lesion, 20 %stenosed.   Severe multivessel native CAD with 99.9% ostial proximal diagonal 1 stenosis in a vessel which had been previously stented and now has TIMI 1 flow, 85% LAD stenosis immediately after the takeoff of this first diagonal vessel and 80% mid LAD stenosis just proximal to the insertion of the LIMA graft.   Ramus intermediate 90% ostial stenosis with competitive filling due a patent SVG.   Ostial Left circumflex 90% in-stent restenosis and occlusion of a marginal branch beyond the stented segment.   Total occlusion of a stent at the ostium of the RCA.   Patent LIMA graft supplying the mid distal LAD with insertion beyond the mid 80% stenosis.   Patent SVG supplying the ramus intermediate vessel with 30% narrowing just beyond the anastomosis.   Patent  SVG supplying the distal RCA which fills the entire RCA distally and antegrade up to the occluded ostial stent.   Successful percutaneous coronary intervention to the subtotally occluded diagonal vessel with the 99.9%  stenosis being reduced to 0% with PTCA and insertion of a 2.2515 mm Resolute Onyx DES stent into the previously placed stent post dilated to 2.45 mm and with the stenosis being reduced to 0% in the entire stented segment.  The sluggish TIMI-1 flow at the start of the procedure was improved to brisk TIMI-3 flow in the diagonal vessel following PCI. The LAD stenosis immediately after the diagonal vessel was 80% stenosed at the completion of the procedure.   RECOMMENDATION: Brillinta was used for antiplatelet therapy during this procedure due to the patient's reported rash to Plavix.  She will need to continue dual antiplatelet therapy.  Medical therapy for concomitant CAD.    Indications       ASSESSMENT:    1. Preoperative clearance   2. Atherosclerosis of native coronary artery of native heart without angina pectoris   3. Chronic diastolic CHF (congestive heart failure) ([place])   4. Essential hypertension   5. ESRD (end stage renal disease) ([place])      PLAN:  In order of problems listed above:   Preoperative clearance for AV fistula revision. This patient has a long history of CAD with 2 NSTEMI's in 09/2016 and 10/2016 treated with DES to the diagonal for in-stent restenosis. Extensive CAD as listed above. Patient is on Brilinta. I discussed this patient in detail with Dr. Bronson Ing who knows her well. He said she can proceed with AV fistula revision as long as she stays on her Brilinta without any interruption given her recent MI and DES. F/U with Dr. Bronson Ing.  Chronic diastolic heart failure currently compensated  Essential hypertension with hypertensive urgency 5 days ago when she ran out of her Imdur. The pressure stable today. Patient did have some chest pain when  her blood pressure was elevated troponins were flat and EKG without change.  End-stage renal disease on hemodialysis currently getting through her port since fistula isn't working properly.   Medication Adjustments/Labs and Tests Ordered: Current medicines are reviewed at length with the patient today.  Concerns regarding medicines are outlined above.  Medication changes, Labs and Tests ordered today are listed in the Patient Instructions below. Patient Instructions  Your physician recommends that you schedule a follow-up appointment in: 2-3 Months with Dr. Bronson Ing   Your physician recommends that you continue on your current medications as directed. Please refer to the Current Medication list given to you today.  You may have your surgery but may not stop Brilinta   If you need a refill on your cardiac medications before your next appointment, please call your pharmacy.  Thank you for choosing [facility]!        Sumner Boast, PA-C  01/25/2017 12:19 PM    [facility] [address], [place], [name]  [address] Phone: [phone]; Fax: [phone]

## 2017-01-26 DIAGNOSIS — R739 Hyperglycemia, unspecified: Secondary | ICD-10-CM | POA: Diagnosis not present

## 2017-01-26 DIAGNOSIS — Z6824 Body mass index (BMI) 24.0-24.9, adult: Secondary | ICD-10-CM | POA: Diagnosis not present

## 2017-01-26 DIAGNOSIS — R69 Illness, unspecified: Secondary | ICD-10-CM | POA: Diagnosis not present

## 2017-01-26 NOTE — Pre-Procedure Instructions (Signed)
Cardiac clearance received from Dr. Bronson Ing office clearing patient to proceed with surgery, but to remain on Brilinta. Dr. Delana Meyer office notified (spoke to [place] ).

## 2017-01-27 DIAGNOSIS — N186 End stage renal disease: Secondary | ICD-10-CM | POA: Diagnosis not present

## 2017-01-27 DIAGNOSIS — Z992 Dependence on renal dialysis: Secondary | ICD-10-CM | POA: Diagnosis not present

## 2017-01-28 DIAGNOSIS — Z992 Dependence on renal dialysis: Secondary | ICD-10-CM | POA: Diagnosis not present

## 2017-01-28 DIAGNOSIS — E8770 Fluid overload, unspecified: Secondary | ICD-10-CM | POA: Diagnosis not present

## 2017-01-28 DIAGNOSIS — N186 End stage renal disease: Secondary | ICD-10-CM | POA: Diagnosis not present

## 2017-01-28 LAB — TYPE AND SCREEN
ABO/RH(D): O POS
Antibody Screen: POSITIVE
EXTEND SAMPLE REASON: TRANSFUSED

## 2017-01-30 DIAGNOSIS — N186 End stage renal disease: Secondary | ICD-10-CM | POA: Diagnosis not present

## 2017-01-30 DIAGNOSIS — Z992 Dependence on renal dialysis: Secondary | ICD-10-CM | POA: Diagnosis not present

## 2017-01-31 DIAGNOSIS — N186 End stage renal disease: Secondary | ICD-10-CM | POA: Diagnosis not present

## 2017-01-31 DIAGNOSIS — Z992 Dependence on renal dialysis: Secondary | ICD-10-CM | POA: Diagnosis not present

## 2017-01-31 MED ORDER — CLINDAMYCIN PHOSPHATE 300 MG/50ML IV SOLN: 300.0000 mg | Freq: Once | INTRAVENOUS | Status: DC

## 2017-02-01 ENCOUNTER — Ambulatory Visit
Admission: RE | Admit: 2017-02-01 | Discharge: 2017-02-01 | Disposition: A | Discharge status: Home / Self Care | Payer: Medicare HMO | Source: Ambulatory Visit | Attending: Vascular Surgery | Admitting: Vascular Surgery

## 2017-02-01 ENCOUNTER — Ambulatory Visit: Payer: Medicare HMO | Admitting: Anesthesiology

## 2017-02-01 ENCOUNTER — Encounter
Admission: RE | Disposition: A | Discharge status: Home / Self Care | Payer: Self-pay | Source: Ambulatory Visit | Attending: Vascular Surgery

## 2017-02-01 ENCOUNTER — Encounter: Payer: Self-pay | Admitting: *Deleted

## 2017-02-01 ENCOUNTER — Encounter (INDEPENDENT_AMBULATORY_CARE_PROVIDER_SITE_OTHER): Payer: Self-pay

## 2017-02-01 DIAGNOSIS — M1990 Unspecified osteoarthritis, unspecified site: Secondary | ICD-10-CM | POA: Insufficient documentation

## 2017-02-01 DIAGNOSIS — D649 Anemia, unspecified: Secondary | ICD-10-CM | POA: Insufficient documentation

## 2017-02-01 DIAGNOSIS — I25119 Atherosclerotic heart disease of native coronary artery with unspecified angina pectoris: Secondary | ICD-10-CM | POA: Insufficient documentation

## 2017-02-01 DIAGNOSIS — Z8673 Personal history of transient ischemic attack (TIA), and cerebral infarction without residual deficits: Secondary | ICD-10-CM | POA: Insufficient documentation

## 2017-02-01 DIAGNOSIS — I110 Hypertensive heart disease with heart failure: Secondary | ICD-10-CM | POA: Diagnosis not present

## 2017-02-01 DIAGNOSIS — I739 Peripheral vascular disease, unspecified: Secondary | ICD-10-CM | POA: Diagnosis not present

## 2017-02-01 DIAGNOSIS — K219 Gastro-esophageal reflux disease without esophagitis: Secondary | ICD-10-CM | POA: Diagnosis not present

## 2017-02-01 DIAGNOSIS — N186 End stage renal disease: Secondary | ICD-10-CM | POA: Diagnosis present

## 2017-02-01 DIAGNOSIS — Z951 Presence of aortocoronary bypass graft: Secondary | ICD-10-CM | POA: Diagnosis not present

## 2017-02-01 DIAGNOSIS — F419 Anxiety disorder, unspecified: Secondary | ICD-10-CM | POA: Insufficient documentation

## 2017-02-01 DIAGNOSIS — Z5309 Procedure and treatment not carried out because of other contraindication: Secondary | ICD-10-CM | POA: Insufficient documentation

## 2017-02-01 DIAGNOSIS — I252 Old myocardial infarction: Secondary | ICD-10-CM | POA: Insufficient documentation

## 2017-02-01 DIAGNOSIS — I509 Heart failure, unspecified: Secondary | ICD-10-CM | POA: Insufficient documentation

## 2017-02-01 DIAGNOSIS — K279 Peptic ulcer, site unspecified, unspecified as acute or chronic, without hemorrhage or perforation: Secondary | ICD-10-CM | POA: Insufficient documentation

## 2017-02-01 DIAGNOSIS — Z955 Presence of coronary angioplasty implant and graft: Secondary | ICD-10-CM | POA: Insufficient documentation

## 2017-02-01 DIAGNOSIS — Z79899 Other long term (current) drug therapy: Secondary | ICD-10-CM | POA: Diagnosis not present

## 2017-02-01 LAB — POCT I-STAT 4, (NA,K, GLUC, HGB,HCT)
Glucose, Bld: 88 mg/dL (ref 65–99)
HEMATOCRIT: 32 % — AB (ref 36.0–46.0)
Hemoglobin: 10.9 g/dL — ABNORMAL LOW (ref 12.0–15.0)
Potassium: 5.3 mmol/L — ABNORMAL HIGH (ref 3.5–5.1)
Sodium: 138 mmol/L (ref 135–145)

## 2017-02-01 SURGERY — CANCELLED PROCEDURE
Anesthesia: General | Laterality: Left

## 2017-02-01 MED ORDER — LIDOCAINE HCL (PF) 2 % IJ SOLN
INTRAMUSCULAR | Status: AC
  Filled 2017-02-01: qty 2

## 2017-02-01 MED ORDER — FENTANYL CITRATE (PF) 100 MCG/2ML IJ SOLN
INTRAMUSCULAR | Status: AC
  Filled 2017-02-01: qty 2

## 2017-02-01 MED ORDER — BUPIVACAINE HCL (PF) 0.5 % IJ SOLN
INTRAMUSCULAR | Status: AC
  Filled 2017-02-01: qty 30

## 2017-02-01 MED ORDER — HEPARIN SODIUM (PORCINE) 5000 UNIT/ML IJ SOLN
INTRAMUSCULAR | Status: AC
  Filled 2017-02-01: qty 1

## 2017-02-01 MED ORDER — CLINDAMYCIN PHOSPHATE 300 MG/50ML IV SOLN
INTRAVENOUS | Status: AC
  Filled 2017-02-01: qty 50

## 2017-02-01 MED ORDER — SODIUM CHLORIDE 0.9 % IV SOLN
INTRAVENOUS | Status: DC
  Administered 2017-02-01: 07:00:00 via INTRAVENOUS

## 2017-02-01 MED ORDER — PROPOFOL 10 MG/ML IV BOLUS
INTRAVENOUS | Status: AC
  Filled 2017-02-01: qty 20

## 2017-02-01 SURGICAL SUPPLY — 55 items
APPLIER CLIP 11 MED OPEN (CLIP)
APPLIER CLIP 9.375 SM OPEN (CLIP)
BAG COUNTER SPONGE EZ (MISCELLANEOUS) IMPLANT
BAG DECANTER FOR FLEXI CONT (MISCELLANEOUS) IMPLANT
BLADE SURG SZ11 CARB STEEL (BLADE) IMPLANT
BOOT SUTURE AID YELLOW STND (SUTURE) IMPLANT
BRUSH SCRUB 4% CHG (MISCELLANEOUS) IMPLANT
CANISTER SUCT 1200ML W/VALVE (MISCELLANEOUS) IMPLANT
CHLORAPREP W/TINT 26ML (MISCELLANEOUS) IMPLANT
CLIP APPLIE 11 MED OPEN (CLIP) IMPLANT
CLIP APPLIE 9.375 SM OPEN (CLIP) IMPLANT
DERMABOND ADVANCED (GAUZE/BANDAGES/DRESSINGS)
DERMABOND ADVANCED .7 DNX12 (GAUZE/BANDAGES/DRESSINGS) IMPLANT
DRESSING SURGICEL FIBRLLR 1X2 (HEMOSTASIS) IMPLANT
DRSG SURGICEL FIBRILLAR 1X2 (HEMOSTASIS)
ELECT CAUTERY BLADE 6.4 (BLADE) IMPLANT
ELECT REM PT RETURN 9FT ADLT (ELECTROSURGICAL)
ELECTRODE REM PT RTRN 9FT ADLT (ELECTROSURGICAL) IMPLANT
GLOVE BIO SURGEON STRL SZ7 (GLOVE) IMPLANT
GLOVE INDICATOR 7.5 STRL GRN (GLOVE) IMPLANT
GLOVE SURG SYN 8.0 (GLOVE) IMPLANT
GOWN STRL REUS W/ TWL LRG LVL3 (GOWN DISPOSABLE) IMPLANT
GOWN STRL REUS W/ TWL XL LVL3 (GOWN DISPOSABLE) IMPLANT
GOWN STRL REUS W/TWL LRG LVL3 (GOWN DISPOSABLE)
GOWN STRL REUS W/TWL XL LVL3 (GOWN DISPOSABLE)
IV NS 500ML (IV SOLUTION)
IV NS 500ML BAXH (IV SOLUTION) IMPLANT
KIT RM TURNOVER STRD PROC AR (KITS) IMPLANT
LABEL OR SOLS (LABEL) IMPLANT
LOOP RED MAXI  1X406MM (MISCELLANEOUS)
LOOP VESSEL MAXI 1X406 RED (MISCELLANEOUS) IMPLANT
LOOP VESSEL MINI 0.8X406 BLUE (MISCELLANEOUS) IMPLANT
LOOPS BLUE MINI 0.8X406MM (MISCELLANEOUS)
NEEDLE FILTER BLUNT 18X 1/2SAF (NEEDLE)
NEEDLE FILTER BLUNT 18X1 1/2 (NEEDLE) IMPLANT
NS IRRIG 500ML POUR BTL (IV SOLUTION) IMPLANT
PACK EXTREMITY ARMC (MISCELLANEOUS) IMPLANT
PAD PREP 24X41 OB/GYN DISP (PERSONAL CARE ITEMS) IMPLANT
PUNCH SURGICAL ROTATE 2.7MM (MISCELLANEOUS) IMPLANT
STOCKINETTE STRL 4IN 9604848 (GAUZE/BANDAGES/DRESSINGS) IMPLANT
SUT GTX CV-6 30 (SUTURE) IMPLANT
SUT MNCRL+ 5-0 UNDYED PC-3 (SUTURE) IMPLANT
SUT MONOCRYL 5-0 (SUTURE)
SUT PROLENE 6 0 BV (SUTURE) IMPLANT
SUT SILK 2 0 (SUTURE)
SUT SILK 2 0 SH (SUTURE) IMPLANT
SUT SILK 2-0 18XBRD TIE 12 (SUTURE) IMPLANT
SUT SILK 3 0 (SUTURE)
SUT SILK 3-0 18XBRD TIE 12 (SUTURE) IMPLANT
SUT SILK 4 0 (SUTURE)
SUT SILK 4-0 18XBRD TIE 12 (SUTURE) IMPLANT
SUT VIC AB 3-0 SH 27 (SUTURE)
SUT VIC AB 3-0 SH 27X BRD (SUTURE) IMPLANT
SYR 20CC LL (SYRINGE) IMPLANT
SYR 3ML LL SCALE MARK (SYRINGE) IMPLANT

## 2017-02-01 NOTE — Anesthesia Preprocedure Evaluation (Addendum)
Anesthesia Evaluation  Patient identified by MRN, date of birth, ID band Patient awake    Reviewed: Allergy & Precautions, NPO status , Patient's Chart, lab work & pertinent test results, reviewed documented beta blocker date and time   Airway Mallampati: II  TM Distance: >3 FB     Dental  (+) Chipped, Missing   Pulmonary pneumonia, resolved,           Cardiovascular hypertension, Pt. on medications and Pt. on home beta blockers + angina + CAD, + Past MI, + Cardiac Stents, + CABG, + Peripheral Vascular Disease and +CHF       Neuro/Psych Anxiety TIA   GI/Hepatic PUD, GERD  Controlled,  Endo/Other    Renal/GU Renal disease     Musculoskeletal  (+) Arthritis ,   Abdominal   Peds  Hematology  (+) anemia ,   Anesthesia Other Findings   Reproductive/Obstetrics                            Anesthesia Physical Anesthesia Plan  ASA: III  Anesthesia Plan: General   Post-op Pain Management:    Induction: Intravenous  PONV Risk Score and Plan:   Airway Management Planned: LMA  Additional Equipment:   Intra-op Plan:   Post-operative Plan:   Informed Consent: I have reviewed the patients History and Physical, chart, labs and discussed the procedure including the risks, benefits and alternatives for the proposed anesthesia with the patient or authorized representative who has indicated his/her understanding and acceptance.     Plan Discussed with: CRNA  Anesthesia Plan Comments:         Anesthesia Quick Evaluation

## 2017-02-01 NOTE — OR Nursing (Signed)
Pt awake and Ox3, very pleasant, arrived via wheelchair pt stated that she took her 81 mg ASA and Brilinta yesterday, her cardiologist, Dr Bronson Ing, told her to continue meds. Dr Delana Meyer in with pt and cancelled pt d/t the meds. Instructed pt that office would call her to reschedule.

## 2017-02-01 NOTE — OR Nursing (Signed)
Case cancelled by surgeon

## 2017-02-03 DIAGNOSIS — N186 End stage renal disease: Secondary | ICD-10-CM | POA: Diagnosis not present

## 2017-02-03 DIAGNOSIS — Z992 Dependence on renal dialysis: Secondary | ICD-10-CM | POA: Diagnosis not present

## 2017-02-03 LAB — BPAM RBC
BLOOD PRODUCT EXPIRATION DATE: 201808222359
Blood Product Expiration Date: 201808222359
UNIT TYPE AND RH: 5100
Unit Type and Rh: 5100

## 2017-02-03 LAB — TYPE AND SCREEN
ABO/RH(D): O POS
ANTIBODY SCREEN: NEGATIVE
UNIT DIVISION: 0
UNIT DIVISION: 0

## 2017-02-06 DIAGNOSIS — N186 End stage renal disease: Secondary | ICD-10-CM | POA: Diagnosis not present

## 2017-02-06 DIAGNOSIS — Z992 Dependence on renal dialysis: Secondary | ICD-10-CM | POA: Diagnosis not present

## 2017-02-07 DIAGNOSIS — Z992 Dependence on renal dialysis: Secondary | ICD-10-CM | POA: Diagnosis not present

## 2017-02-07 DIAGNOSIS — N186 End stage renal disease: Secondary | ICD-10-CM | POA: Diagnosis not present

## 2017-02-08 ENCOUNTER — Ambulatory Visit (HOSPITAL_COMMUNITY): Admit: 2017-02-08 | Payer: Medicare HMO | Admitting: Cardiovascular Disease

## 2017-02-08 ENCOUNTER — Encounter (HOSPITAL_COMMUNITY)
Admission: EM | Disposition: A | Discharge status: Home / Self Care | Payer: Self-pay | Source: Home / Self Care | Attending: Emergency Medicine

## 2017-02-08 ENCOUNTER — Emergency Department (HOSPITAL_COMMUNITY): Payer: Medicare HMO

## 2017-02-08 ENCOUNTER — Encounter (HOSPITAL_COMMUNITY): Payer: Self-pay | Admitting: *Deleted

## 2017-02-08 ENCOUNTER — Observation Stay (HOSPITAL_COMMUNITY)
Admission: EM | Admit: 2017-02-08 | Discharge: 2017-02-09 | Disposition: A | Discharge status: Home / Self Care | Payer: Medicare HMO | Attending: Cardiovascular Disease | Admitting: Cardiovascular Disease

## 2017-02-08 DIAGNOSIS — I200 Unstable angina: Secondary | ICD-10-CM

## 2017-02-08 DIAGNOSIS — R52 Pain, unspecified: Secondary | ICD-10-CM | POA: Diagnosis not present

## 2017-02-08 DIAGNOSIS — R195 Other fecal abnormalities: Secondary | ICD-10-CM

## 2017-02-08 DIAGNOSIS — Z8679 Personal history of other diseases of the circulatory system: Secondary | ICD-10-CM | POA: Diagnosis not present

## 2017-02-08 DIAGNOSIS — Z880 Allergy status to penicillin: Secondary | ICD-10-CM | POA: Insufficient documentation

## 2017-02-08 DIAGNOSIS — Z7982 Long term (current) use of aspirin: Secondary | ICD-10-CM | POA: Insufficient documentation

## 2017-02-08 DIAGNOSIS — I120 Hypertensive chronic kidney disease with stage 5 chronic kidney disease or end stage renal disease: Secondary | ICD-10-CM | POA: Diagnosis not present

## 2017-02-08 DIAGNOSIS — Z992 Dependence on renal dialysis: Secondary | ICD-10-CM | POA: Diagnosis not present

## 2017-02-08 DIAGNOSIS — D649 Anemia, unspecified: Secondary | ICD-10-CM | POA: Insufficient documentation

## 2017-02-08 DIAGNOSIS — N186 End stage renal disease: Secondary | ICD-10-CM

## 2017-02-08 DIAGNOSIS — D631 Anemia in chronic kidney disease: Secondary | ICD-10-CM | POA: Diagnosis not present

## 2017-02-08 DIAGNOSIS — N2581 Secondary hyperparathyroidism of renal origin: Secondary | ICD-10-CM | POA: Diagnosis not present

## 2017-02-08 DIAGNOSIS — I132 Hypertensive heart and chronic kidney disease with heart failure and with stage 5 chronic kidney disease, or end stage renal disease: Secondary | ICD-10-CM | POA: Diagnosis not present

## 2017-02-08 DIAGNOSIS — R072 Precordial pain: Secondary | ICD-10-CM | POA: Diagnosis not present

## 2017-02-08 DIAGNOSIS — I5032 Chronic diastolic (congestive) heart failure: Secondary | ICD-10-CM | POA: Insufficient documentation

## 2017-02-08 DIAGNOSIS — Z951 Presence of aortocoronary bypass graft: Secondary | ICD-10-CM | POA: Insufficient documentation

## 2017-02-08 DIAGNOSIS — E875 Hyperkalemia: Secondary | ICD-10-CM | POA: Diagnosis not present

## 2017-02-08 DIAGNOSIS — I160 Hypertensive urgency: Secondary | ICD-10-CM | POA: Diagnosis present

## 2017-02-08 DIAGNOSIS — E782 Mixed hyperlipidemia: Secondary | ICD-10-CM | POA: Diagnosis present

## 2017-02-08 DIAGNOSIS — I252 Old myocardial infarction: Secondary | ICD-10-CM | POA: Insufficient documentation

## 2017-02-08 DIAGNOSIS — Z79899 Other long term (current) drug therapy: Secondary | ICD-10-CM | POA: Diagnosis not present

## 2017-02-08 DIAGNOSIS — R0789 Other chest pain: Secondary | ICD-10-CM | POA: Diagnosis not present

## 2017-02-08 DIAGNOSIS — E785 Hyperlipidemia, unspecified: Secondary | ICD-10-CM | POA: Diagnosis present

## 2017-02-08 DIAGNOSIS — R079 Chest pain, unspecified: Secondary | ICD-10-CM | POA: Diagnosis not present

## 2017-02-08 DIAGNOSIS — K921 Melena: Secondary | ICD-10-CM | POA: Diagnosis not present

## 2017-02-08 LAB — TROPONIN I
TROPONIN I: 0.03 ng/mL — AB (ref <0.03)
Troponin I: 0.03 ng/mL (ref <0.03)

## 2017-02-08 LAB — CBC WITH DIFFERENTIAL/PLATELET
BASOS ABS: 0 10*3/uL (ref 0.0–0.1)
Basophils Relative: 1 %
EOS ABS: 0.6 10*3/uL (ref 0.0–0.7)
EOS PCT: 10 %
HCT: 24.2 % — ABNORMAL LOW (ref 36.0–46.0)
HEMOGLOBIN: 8 g/dL — AB (ref 12.0–15.0)
LYMPHS ABS: 0.9 10*3/uL (ref 0.7–4.0)
Lymphocytes Relative: 16 %
MCH: 31.7 pg (ref 26.0–34.0)
MCHC: 33.1 g/dL (ref 30.0–36.0)
MCV: 96 fL (ref 78.0–100.0)
Monocytes Absolute: 0.4 10*3/uL (ref 0.1–1.0)
Monocytes Relative: 8 %
NEUTROS PCT: 65 %
Neutro Abs: 3.9 10*3/uL (ref 1.7–7.7)
PLATELETS: 195 10*3/uL (ref 150–400)
RBC: 2.52 MIL/uL — AB (ref 3.87–5.11)
RDW: 16 % — ABNORMAL HIGH (ref 11.5–15.5)
WBC: 5.9 10*3/uL (ref 4.0–10.5)

## 2017-02-08 LAB — COMPREHENSIVE METABOLIC PANEL
ALBUMIN: 2.9 g/dL — AB (ref 3.5–5.0)
ALT: 10 U/L — ABNORMAL LOW (ref 14–54)
ANION GAP: 13 (ref 5–15)
AST: 17 U/L (ref 15–41)
Alkaline Phosphatase: 99 U/L (ref 38–126)
BUN: 47 mg/dL — ABNORMAL HIGH (ref 6–20)
CALCIUM: 8 mg/dL — AB (ref 8.9–10.3)
CHLORIDE: 96 mmol/L — AB (ref 101–111)
CO2: 22 mmol/L (ref 22–32)
Creatinine, Ser: 8.95 mg/dL — ABNORMAL HIGH (ref 0.44–1.00)
GFR calc non Af Amer: 4 mL/min — ABNORMAL LOW (ref >60)
GFR, EST AFRICAN AMERICAN: 4 mL/min — AB (ref >60)
GLUCOSE: 118 mg/dL — AB (ref 65–99)
POTASSIUM: 4.4 mmol/L (ref 3.5–5.1)
SODIUM: 131 mmol/L — AB (ref 135–145)
Total Bilirubin: 0.4 mg/dL (ref 0.3–1.2)
Total Protein: 5.3 g/dL — ABNORMAL LOW (ref 6.5–8.1)

## 2017-02-08 LAB — APTT: APTT: 32 s (ref 24–36)

## 2017-02-08 LAB — LIPID PANEL
CHOL/HDL RATIO: 2 ratio
CHOLESTEROL: 106 mg/dL (ref 0–200)
HDL: 52 mg/dL (ref >40)
LDL CALC: 31 mg/dL (ref 0–99)
TRIGLYCERIDES: 113 mg/dL (ref <150)
VLDL: 23 mg/dL (ref 0–40)

## 2017-02-08 LAB — PROTIME-INR
INR: 1.01
PROTHROMBIN TIME: 13.3 s (ref 11.4–15.2)

## 2017-02-08 LAB — POC OCCULT BLOOD, ED: Fecal Occult Bld: POSITIVE — AB

## 2017-02-08 LAB — PREPARE RBC (CROSSMATCH)

## 2017-02-08 IMAGING — DX DG CHEST 1V PORT
1 series · 1 of 1 positions shown · non-contrast
Comparison: [DATE]

CLINICAL DATA: Chest pain

EXAM:
PORTABLE CHEST 1 VIEW

[chest ap]
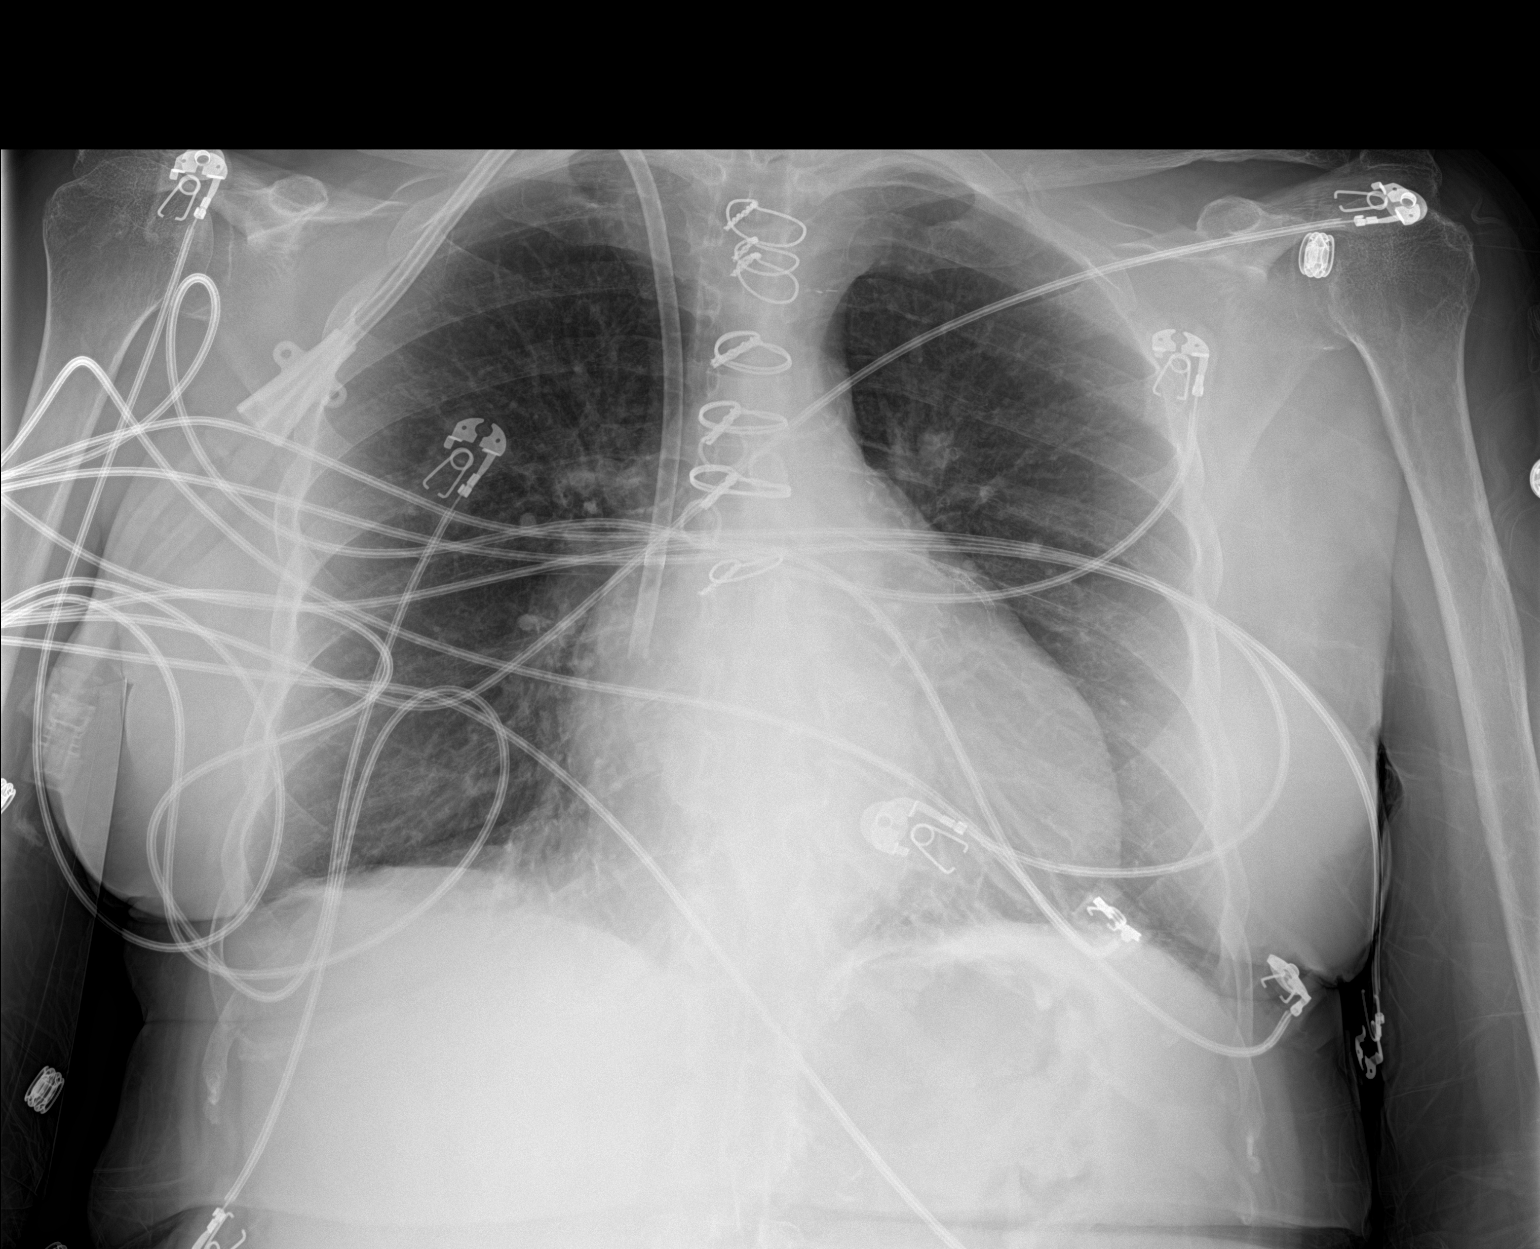

[1 of 1 positions shown; findings below may reference images not displayed]

FINDINGS: There is slight scarring in the left base. Lungs elsewhere clear.
Heart is mildly enlarged with pulmonary vascularity within normal
limits. No adenopathy. There is aortic atherosclerosis. Central
catheter tip is in the superior vena cava. No pneumothorax. No bone
lesions.
IMPRESSION: Slight scarring left base. No edema or consolidation. Stable cardiac
prominence. There is aortic atherosclerosis.

Aortic Atherosclerosis ([YR]-[YR]).

## 2017-02-08 SURGERY — LEFT HEART CATH AND CORONARY ANGIOGRAPHY
Anesthesia: LOCAL

## 2017-02-08 MED ORDER — LIDOCAINE HCL (PF) 1 % IJ SOLN: 5.0000 mL | INTRAMUSCULAR | Status: DC | PRN

## 2017-02-08 MED ORDER — SEVELAMER CARBONATE 800 MG PO TABS
800.0000 mg | ORAL_TABLET | Freq: Three times a day (TID) | ORAL | Status: DC
  Administered 2017-02-09: 800 mg via ORAL
  Filled 2017-02-08: qty 1

## 2017-02-08 MED ORDER — CINACALCET HCL 30 MG PO TABS
30.0000 mg | ORAL_TABLET | Freq: Every day | ORAL | Status: DC
  Filled 2017-02-08: qty 1

## 2017-02-08 MED ORDER — TICAGRELOR 90 MG PO TABS
90.0000 mg | ORAL_TABLET | Freq: Two times a day (BID) | ORAL | Status: DC
  Administered 2017-02-08 – 2017-02-09 (×2): 90 mg via ORAL
  Filled 2017-02-08 (×2): qty 1

## 2017-02-08 MED ORDER — SODIUM CHLORIDE 0.9 % IV SOLN: INTRAVENOUS | Status: DC

## 2017-02-08 MED ORDER — LANTHANUM CARBONATE 500 MG PO CHEW
1000.0000 mg | CHEWABLE_TABLET | Freq: Three times a day (TID) | ORAL | Status: DC
  Administered 2017-02-09: 1000 mg via ORAL
  Filled 2017-02-08: qty 2

## 2017-02-08 MED ORDER — ALPRAZOLAM 0.25 MG PO TABS: 0.2500 mg | ORAL_TABLET | Freq: Two times a day (BID) | ORAL | Status: DC | PRN

## 2017-02-08 MED ORDER — DOXERCALCIFEROL 4 MCG/2ML IV SOLN: 3.0000 ug | INTRAVENOUS | Status: DC

## 2017-02-08 MED ORDER — SODIUM CHLORIDE 0.9 % IV SOLN: Freq: Once | INTRAVENOUS | Status: DC

## 2017-02-08 MED ORDER — LIDOCAINE-PRILOCAINE 2.5-2.5 % EX CREA
1.0000 "application " | TOPICAL_CREAM | CUTANEOUS | Status: DC | PRN
  Filled 2017-02-08: qty 5

## 2017-02-08 MED ORDER — ASPIRIN 81 MG PO CHEW: 324.0000 mg | CHEWABLE_TABLET | Freq: Once | ORAL | Status: DC

## 2017-02-08 MED ORDER — HEPARIN BOLUS VIA INFUSION
3000.0000 [IU] | Freq: Once | INTRAVENOUS | Status: AC
  Administered 2017-02-08: 3000 [IU] via INTRAVENOUS
  Filled 2017-02-08: qty 3000

## 2017-02-08 MED ORDER — PENTAFLUOROPROP-TETRAFLUOROETH EX AERO: 1.0000 "application " | INHALATION_SPRAY | CUTANEOUS | Status: DC | PRN

## 2017-02-08 MED ORDER — SIMVASTATIN 20 MG PO TABS
20.0000 mg | ORAL_TABLET | Freq: Every day | ORAL | Status: DC
  Administered 2017-02-08: 20 mg via ORAL
  Filled 2017-02-08: qty 1

## 2017-02-08 MED ORDER — HEPARIN SODIUM (PORCINE) 1000 UNIT/ML DIALYSIS
20.0000 [IU]/kg | INTRAMUSCULAR | Status: DC | PRN
  Filled 2017-02-08: qty 2

## 2017-02-08 MED ORDER — ACETAMINOPHEN 325 MG PO TABS: 650.0000 mg | ORAL_TABLET | ORAL | Status: DC | PRN

## 2017-02-08 MED ORDER — RENA-VITE PO TABS: 1.0000 | ORAL_TABLET | Freq: Every day | ORAL | Status: DC

## 2017-02-08 MED ORDER — AMIODARONE HCL 200 MG PO TABS
200.0000 mg | ORAL_TABLET | Freq: Every day | ORAL | Status: DC
  Administered 2017-02-09: 200 mg via ORAL
  Filled 2017-02-08: qty 1

## 2017-02-08 MED ORDER — HEPARIN (PORCINE) IN NACL 100-0.45 UNIT/ML-% IJ SOLN
600.0000 [IU]/h | INTRAMUSCULAR | Status: DC
  Administered 2017-02-08: 600 [IU]/h via INTRAVENOUS
  Filled 2017-02-08: qty 250

## 2017-02-08 MED ORDER — SODIUM CHLORIDE 0.9 % IV SOLN: 100.0000 mL | INTRAVENOUS | Status: DC | PRN

## 2017-02-08 MED ORDER — ASPIRIN EC 81 MG PO TBEC
81.0000 mg | DELAYED_RELEASE_TABLET | Freq: Every day | ORAL | Status: DC
  Administered 2017-02-09: 81 mg via ORAL
  Filled 2017-02-08: qty 1

## 2017-02-08 MED ORDER — NITROGLYCERIN IN D5W 200-5 MCG/ML-% IV SOLN
INTRAVENOUS | Status: AC
  Administered 2017-02-08: 5 ug via INTRAVENOUS
  Filled 2017-02-08: qty 250

## 2017-02-08 MED ORDER — NITROGLYCERIN 0.4 MG SL SUBL: 0.4000 mg | SUBLINGUAL_TABLET | SUBLINGUAL | Status: DC | PRN

## 2017-02-08 MED ORDER — CLONIDINE HCL 0.2 MG PO TABS
0.2000 mg | ORAL_TABLET | Freq: Three times a day (TID) | ORAL | Status: DC
  Administered 2017-02-08 – 2017-02-09 (×2): 0.2 mg via ORAL
  Filled 2017-02-08 (×2): qty 1

## 2017-02-08 MED ORDER — CARVEDILOL 6.25 MG PO TABS
6.2500 mg | ORAL_TABLET | Freq: Two times a day (BID) | ORAL | Status: DC
  Administered 2017-02-09: 6.25 mg via ORAL
  Filled 2017-02-08: qty 1

## 2017-02-08 MED ORDER — DARBEPOETIN ALFA 150 MCG/0.3ML IJ SOSY: 150.0000 ug | PREFILLED_SYRINGE | INTRAMUSCULAR | Status: DC

## 2017-02-08 MED ORDER — HEPARIN SODIUM (PORCINE) 5000 UNIT/ML IJ SOLN: 60.0000 [IU]/kg | Freq: Once | INTRAMUSCULAR | Status: DC

## 2017-02-08 MED ORDER — ALTEPLASE 2 MG IJ SOLR: 2.0000 mg | Freq: Once | INTRAMUSCULAR | Status: DC | PRN

## 2017-02-08 MED ORDER — HEPARIN SODIUM (PORCINE) 1000 UNIT/ML DIALYSIS
1000.0000 [IU] | INTRAMUSCULAR | Status: DC | PRN
  Filled 2017-02-08: qty 1

## 2017-02-08 MED ORDER — NITROGLYCERIN IN D5W 200-5 MCG/ML-% IV SOLN
0.0000 ug/min | Freq: Once | INTRAVENOUS | Status: AC
Start: 2017-02-08 — End: 2017-02-09
  Administered 2017-02-08: 5 ug/min via INTRAVENOUS
  Administered 2017-02-08: 5 ug via INTRAVENOUS
  Filled 2017-02-08: qty 250

## 2017-02-08 MED ORDER — ONDANSETRON HCL 4 MG/2ML IJ SOLN: 4.0000 mg | Freq: Four times a day (QID) | INTRAMUSCULAR | Status: DC | PRN

## 2017-02-08 NOTE — ED Triage Notes (Signed)
Patient presents to ed via [facility] states she started having a little chest pain last on took 1 NTG with relief, was able to sleep last pm, this am went to dialysis and was having chest pressure, was sent to ED never received dialysis. States the pain was worse today. Upon arrival to ed pain is 2/10. Dr. Burt Knack at bedside.  Patient states she was in the hospital several weeks ago and had a stent placed. Patient is alert and oriented. Patient states the pain radiated into her back.

## 2017-02-08 NOTE — ED Notes (Signed)
Per IR team is going home and if pt needs peripheral or central line critical care will have to do it. Harlan Stains, NP aware. Pt requests this RN try again for a line and to try in her left arm. Pt aware of old dialysis acsess in left arm and nephrology wanting to eventually reuse site. Pt states again for this RN to attempt a line because she does not want a line in her neck. Line attempted and successful. Harlan Stains, NP aware.

## 2017-02-08 NOTE — Progress Notes (Signed)
RT Note: Stuck arterial for labs

## 2017-02-08 NOTE — Consult Note (Signed)
Reason for Consult: To manage dialysis and dialysis related needs Referring Physician: [name] is an 76 y.o. female with malignant HTN, CAD- most recent PCI April of 2018 as well as ESRD- HD MWF at [facility] via [name].  She presents now with CP since the early AM- BP of 242/97.  Apparently had similar admission last month when out of BP meds but reports taking meds now.  She is being admitted for BP management, rule out and likely cardiac cath in AM.  We are consulted for HD- I spoke to her home unit, she was at HD on Monday, BP was good "for her" 140's to 160's- they note it is not unusual for her to have a BP like this.  When I see her in the ER her blood pressure is lower, she is on the phone, NAD   Dialyzes at [place]- MWF  EDW 56.5. 3 hours 45 min HD [place] 2K/2.5 calc, Heparin yes- 2000 bolus. Access PC for now, access recently clotted off. 350 BFR, 600 DFR- BP will drop fast according to nursing.  Epo 7800 per HD , hectorol 3 mcg q HD, fosrenol 1000 TID for phos binding   Past Medical History:  Diagnosis Date  . Acute on chronic respiratory failure with hypoxia ([name]) 10/10/2016  . Anxiety   . Arthritis   . AVM (arteriovenous malformation) of colon   . CAD (coronary artery disease)    a. 12/2011 NSTEMI/Cath/PCI LCX (2.25x14 Resolute DES) & D1 (2.25x22 Resolute DES);  b. 01/2012 Cath/PCI: LM 30, LAD 30p, 40-64m, D1 stent ok, 99 in sm branch of diag, LCX patent stent, OM1 20, RCA 95 ost (4.0x12 Promus DES), EF 55%;  c. 04/2012 Lexi Cardiolite  EF 48%, small area of scar @ base/mid inflat wall with mild peri-infarct ischemia.; CABG 12/4  . Carotid artery disease ([name])    a. 08-67% LICA, 12/1948   . Chronic bronchitis ([name])   . Chronic diastolic CHF (congestive heart failure) ([name])    a. 02/2012 Echo EF 60-65%, nl wall motion, Gr 1 DD, mod MR  . Colon cancer ([name]) 1992  . Esophageal stricture   . ESRD on hemodialysis ([name])    ESRD due to HTN, started dialysis 2011 and gets HD at  [name] with Dr Hinda Lenis on MWF schedule.  Access is LUA AVF as of Sept 2014.   Marland Kitchen GERD (gastroesophageal reflux disease)   . High cholesterol 12/2011  . History of blood transfusion 07/2011; 12/2011; 01/2012 X 2; 04/2012  . History of gout   . History of lower GI bleeding   . Hypertension   . Iron deficiency anemia   . Mitral regurgitation    a. Moderate by echo, 02/2012  . Myocardial infarction ([place])   . Ovarian cancer ([place]) 1992  . Pneumonia ~ 2009  . PUD (peptic ulcer disease)   . TIA (transient ischemic attack)     Past Surgical History:  Procedure Laterality Date  . ABDOMINAL HYSTERECTOMY  1992  . APPENDECTOMY  06/1990  . AV FISTULA PLACEMENT  07/2009   left upper arm  . AV FISTULA PLACEMENT Right 09/06/2016   Procedure: RIGHT FOREARM ARTERIOVENOUS (AV) GRAFT;  Surgeon: Elam Dutch, MD;  Location: [place];  Service: Vascular;  Laterality: Right;  . [place] REMOVAL Right 09/06/2016   Procedure: REMOVAL OF Right Arm ARTERIOVENOUS GORETEX GRAFT and Vein Patch angioplasty of brachial artery;  Surgeon: Angelia Mould, MD;  Location: [place];  Service: Vascular;  Laterality: Right;  . COLON RESECTION  1992  . COLON SURGERY    . CORONARY ANGIOPLASTY WITH STENT PLACEMENT  12/15/11   "2"  . CORONARY ANGIOPLASTY WITH STENT PLACEMENT  y/2013   "1; makes total of 3" (05/02/2012)  . CORONARY ARTERY BYPASS GRAFT  06/13/2012   Procedure: CORONARY ARTERY BYPASS GRAFTING (CABG);  Surgeon: Grace Isaac, MD;  Location: [place];  Service: Open Heart Surgery;  Laterality: N/A;  cabg x four;  using left internal mammary artery, and left leg greater saphenous vein harvested endoscopically  . CORONARY STENT INTERVENTION N/A 10/13/2016   Procedure: Coronary Stent Intervention;  Surgeon: Troy Sine, MD;  Location: [place] CV LAB;  Service: Cardiovascular;  Laterality: N/A;  . DILATION AND CURETTAGE OF UTERUS    . ESOPHAGOGASTRODUODENOSCOPY  01/20/2012   Procedure: ESOPHAGOGASTRODUODENOSCOPY  (EGD);  Surgeon: Ladene Artist, MD,FACG;  Location: [name] ENDOSCOPY;  Service: Endoscopy;  Laterality: N/A;  . ESOPHAGOGASTRODUODENOSCOPY N/A 03/26/2013   Procedure: ESOPHAGOGASTRODUODENOSCOPY (EGD);  Surgeon: Irene Shipper, MD;  Location: [name] ENDOSCOPY;  Service: Endoscopy;  Laterality: N/A;  . ESOPHAGOGASTRODUODENOSCOPY N/A 04/30/2015   Procedure: ESOPHAGOGASTRODUODENOSCOPY (EGD);  Surgeon: Rogene Houston, MD;  [facility];  Service: Endoscopy;  Laterality: N/A;  1pm - moved to 10/20 @ 1:10  . ESOPHAGOGASTRODUODENOSCOPY N/A 07/29/2016   Procedure: ESOPHAGOGASTRODUODENOSCOPY (EGD);  Surgeon: Manus Gunning, MD;  Location: [place];  Service: Gastroenterology;  Laterality: N/A;  enteroscopy  . INTRAOPERATIVE TRANSESOPHAGEAL ECHOCARDIOGRAM  06/13/2012   Procedure: INTRAOPERATIVE TRANSESOPHAGEAL ECHOCARDIOGRAM;  Surgeon: Grace Isaac, MD;  Location: [place];  Service: Open Heart Surgery;  Laterality: N/A;  . IR GENERIC HISTORICAL  07/26/2016   IR FLUORO GUIDE CV LINE RIGHT 07/26/2016 Greggory Keen, MD [facility]  . IR GENERIC HISTORICAL  07/26/2016   IR US GUIDE VASC ACCESS RIGHT 07/26/2016 Greggory Keen, MD [facility]  . IR GENERIC HISTORICAL  08/02/2016   IR US GUIDE VASC ACCESS RIGHT 08/02/2016 Greggory Keen, MD [facility]  . IR GENERIC HISTORICAL  08/02/2016   IR FLUORO GUIDE CV LINE RIGHT 08/02/2016 Greggory Keen, MD [facility]  . LEFT HEART CATH AND CORONARY ANGIOGRAPHY N/A 09/20/2016   Procedure: Left Heart Cath and Coronary Angiography;  Surgeon: [name], MD;  Location: [place] CV LAB;  Service: Cardiovascular;  Laterality: N/A;  . LEFT HEART CATH AND CORS/GRAFTS ANGIOGRAPHY N/A 10/13/2016   Procedure: Left Heart Cath and Cors/Grafts Angiography;  Surgeon: Troy Sine, MD;  Location: [place] CV LAB;  Service: Cardiovascular;  Laterality: N/A;  . LEFT HEART CATHETERIZATION WITH CORONARY ANGIOGRAM N/A 12/15/2011   Procedure: LEFT HEART CATHETERIZATION  WITH CORONARY ANGIOGRAM;  Surgeon: Burnell Blanks, MD;  Location: [facility] CATH LAB;  Service: Cardiovascular;  Laterality: N/A;  . LEFT HEART CATHETERIZATION WITH CORONARY ANGIOGRAM N/A 01/10/2012   Procedure: LEFT HEART CATHETERIZATION WITH CORONARY ANGIOGRAM;  Surgeon: Peter M Martinique, MD;  Location: [name] CATH LAB;  Service: Cardiovascular;  Laterality: N/A;  . LEFT HEART CATHETERIZATION WITH CORONARY ANGIOGRAM N/A 06/08/2012   Procedure: LEFT HEART CATHETERIZATION WITH CORONARY ANGIOGRAM;  Surgeon: Burnell Blanks, MD;  Location: [facility] CATH LAB;  Service: Cardiovascular;  Laterality: N/A;  . LEFT HEART CATHETERIZATION WITH CORONARY/GRAFT ANGIOGRAM N/A 12/10/2013   Procedure: LEFT HEART CATHETERIZATION WITH Beatrix Fetters;  Surgeon: [name], MD;  Location: [facility] CATH LAB;  Service: Cardiovascular;  Laterality: N/A;  . OVARY SURGERY     ovarian cancer  .  SHUNTOGRAM N/A 10/15/2013   Procedure: Fistulogram;  Surgeon: Serafina Mitchell, MD;  Location: [facility] CATH LAB;  Service: Cardiovascular;  Laterality: N/A;  . THROMBECTOMY / ARTERIOVENOUS GRAFT REVISION  2011   left upper arm  . TUBAL LIGATION  1980's  . UPPER EXTREMITY ANGIOGRAPHY Bilateral 12/06/2016   Procedure: Upper Extremity Angiography;  Surgeon: Katha Cabal, MD;  Location: [place] CV LAB;  Service: Cardiovascular;  Laterality: Bilateral;    Family History  Problem Relation Age of Onset  . Heart disease Mother        Heart Disease before age 81  . Hyperlipidemia Mother   . Hypertension Mother   . Diabetes Mother   . Heart attack Mother   . Heart disease Father        Heart Disease before age 23  . Hyperlipidemia Father   . Hypertension Father   . Diabetes Father   . Diabetes Sister   . Hypertension Sister   . Diabetes Brother   . Hyperlipidemia Brother   . Heart attack Brother   . Hypertension Sister   . Heart attack Brother   . Other Unknown        noncontributory for early CAD  . Colon cancer  Neg Hx   . Esophageal cancer Neg Hx   . Liver disease Neg Hx   . Kidney disease Neg Hx   . Colon polyps Neg Hx     Social History:  reports that she has never smoked. She has never used smokeless tobacco. She reports that she does not drink alcohol or use drugs.  Allergies:  Allergies  Allergen Reactions  . Aspirin Other (See Comments)    High Doses Mess up her stomach; "makes my bowels have blood in them". Takes 81 mg EC Aspirin   . Penicillins Other (See Comments)    SYNCOPE? , "makes me real weak when I take it; like I'll pass out"  Has patient had a PCN reaction causing immediate rash, facial/tongue/throat swelling, SOB or lightheadedness with hypotension: Yes Has patient had a PCN reaction causing severe rash involving mucus membranes or skin necrosis: no Has patient had a PCN reaction that required hospitalization no Has patient had a PCN reaction occurring within the last 10 years: no If all of the above  . Bactrim [Sulfamethoxazole-Trimethoprim] Rash  . Contrast Media [Iodinated Diagnostic Agents] Itching  . Iron Itching and Other (See Comments)    "they gave me iron in dialysis; had to give me Benadryl cause I had to have the iron" (05/02/2012)  . Nitrofurantoin Hives  . Prilosec [Omeprazole] Other (See Comments)    "back spasms"  . Tylenol [Acetaminophen] Itching and Other (See Comments)    Makes her feet on fire per pt  . Dexilant [Dexlansoprazole] Other (See Comments)    Upset stomach  . Levaquin [Levofloxacin In D5w] Rash  . Morphine And Related Itching    Itching in feet  . Plavix [Clopidogrel Bisulfate] Rash  . Protonix [Pantoprazole Sodium] Rash  . Venofer [Ferric Oxide] Itching    Patient reports using Benadryl prior to doses as [place]    Medications: I have reviewed the patient's current medications.   Results for orders placed or performed during the hospital encounter of 02/08/17 (from the past 48 hour(s))  CBC with Differential/Platelet      Status: Abnormal   Collection Time: 02/08/17 12:05 PM  Result Value Ref Range   WBC 5.9 4.0 - 10.5 K/uL   RBC 2.52 (L)  3.87 - 5.11 MIL/uL   Hemoglobin 8.0 (L) 12.0 - 15.0 g/dL   HCT 24.2 (L) 36.0 - 46.0 %   MCV 96.0 78.0 - 100.0 fL   MCH 31.7 26.0 - 34.0 pg   MCHC 33.1 30.0 - 36.0 g/dL   RDW 16.0 (H) 11.5 - 15.5 %   Platelets 195 150 - 400 K/uL   Neutrophils Relative % 65 %   Neutro Abs 3.9 1.7 - 7.7 K/uL   Lymphocytes Relative 16 %   Lymphs Abs 0.9 0.7 - 4.0 K/uL   Monocytes Relative 8 %   Monocytes Absolute 0.4 0.1 - 1.0 K/uL   Eosinophils Relative 10 %   Eosinophils Absolute 0.6 0.0 - 0.7 K/uL   Basophils Relative 1 %   Basophils Absolute 0.0 0.0 - 0.1 K/uL  Protime-INR     Status: None   Collection Time: 02/08/17 12:05 PM  Result Value Ref Range   Prothrombin Time 13.3 11.4 - 15.2 seconds   INR 1.01   APTT     Status: None   Collection Time: 02/08/17 12:05 PM  Result Value Ref Range   aPTT 32 24 - 36 seconds    Dg Chest Port 1 View  Result Date: 02/08/2017 CLINICAL DATA:  Chest pain EXAM: PORTABLE CHEST 1 VIEW COMPARISON:  January 20, 2017 FINDINGS: There is slight scarring in the left base. Lungs elsewhere clear. Heart is mildly enlarged with pulmonary vascularity within normal limits. No adenopathy. There is aortic atherosclerosis. Central catheter tip is in the superior vena cava. No pneumothorax. No bone lesions. IMPRESSION: Slight scarring left base. No edema or consolidation. Stable cardiac prominence. There is aortic atherosclerosis. Aortic Atherosclerosis (ICD10-I70.0). Electronically Signed   By: Lowella Grip III M.D.   On: 02/08/2017 12:42    ROS: positive for CP but now resolved, otherwise negative Blood pressure (!) 190/80, pulse 62, temperature 98.7 F (37.1 C), temperature source Oral, resp. rate 18, height 5\' 2"  (1.575 m), weight 54 kg (119 lb 0.8 oz), SpO2 99 %. General appearance: alert and no distress Eyes: conjunctivae/corneas clear. PERRL, EOM's  intact. Fundi benign. Neck: no adenopathy, no carotid bruit, no JVD, supple, symmetrical, trachea midline and thyroid not enlarged, symmetric, no tenderness/mass/nodules Resp: clear to auscultation bilaterally Cardio: regular rate and rhythm, S1, S2 normal, no murmur, click, rub or gallop GI: soft, non-tender; bowel sounds normal; no masses,  no organomegaly Extremities: extremities normal, atraumatic, no cyanosis or edema Skin: Skin color, texture, turgor normal. No rashes or lesions right sided PC  Assessment/Plan: 76 year old female with HTN , CAD and ESRD.  Presenting with CP and malignant HTN 1 CP- aggressive BP control and per cards, trend enzymes, may need cardiac cath tomorrow or sooner 2 ESRD: normally MWF via PC- high BP not unusual.  Have orders from home unit and will plan HD today on schedule here at [name]. Has appt for new HD access on 8/15 3 Hypertension: malignant and not unusual to be this high.  As OP , on coreg, hydralazine, clonidine and ntg.  Apparently BP labile on HD- probably because is so small- monitor and stabilize best as can 4. Anemia of ESRD: hgb pretty low, this could be contrib to CP- cont ESA here 5. Metabolic Bone Disease: continue home doses of hectorol and fosrenol.  Not on sensipar as OP    [name] A 02/08/2017, 2:27 PM

## 2017-02-08 NOTE — ED Provider Notes (Addendum)
[facility] DEPT Provider Note   CSN: [id number] Arrival date & time: 02/08/17  1128     History   Chief Complaint Chief Complaint  Patient presents with  . Chest Pain    HPI Shawna [name] is a 76 y.o. female.  Patient w hx cad, states this AM was at dialysis when mentioned was having chest pain so they sent to ED.  Pt goes to HD M/W/F - had normal dialysis Monday, they had not yet started today.  Pain was dull, moderate, non radiating. Pt notes feels mildy sob, as if needs her dialysis.  No nv or diaphoresis. Pain was not pleuritic. Chest pain has completely resolved.  States hx cad, takes ntg prn - denies any recent increase in frequency of chest pain. Denies cough or sore throat. No fever or chills. No increased leg pain or swelling.    The history is provided by the patient.  Chest Pain   Associated symptoms include shortness of breath. Pertinent negatives include no abdominal pain, no back pain, no cough, no fever, no headaches, no nausea, no palpitations and no vomiting.    Past Medical History:  Diagnosis Date  . Acute on chronic respiratory failure with hypoxia ([place]) 10/10/2016  . Anxiety   . Arthritis   . AVM (arteriovenous malformation) of colon   . CAD (coronary artery disease)    a. 12/2011 NSTEMI/Cath/PCI LCX (2.25x14 Resolute DES) & D1 (2.25x22 Resolute DES);  b. 01/2012 Cath/PCI: LM 30, LAD 30p, 40-43m, D1 stent ok, 99 in sm branch of diag, LCX patent stent, OM1 20, RCA 95 ost (4.0x12 Promus DES), EF 55%;  c. 04/2012 Lexi Cardiolite  EF 48%, small area of scar @ base/mid inflat wall with mild peri-infarct ischemia.; CABG 12/4  . Carotid artery disease ([place])    a. 63-33% LICA, 11/4560   . Chronic bronchitis ([place])   . Chronic diastolic CHF (congestive heart failure) ([place])    a. 02/2012 Echo EF 60-65%, nl wall motion, Gr 1 DD, mod MR  . Colon cancer ([place]) 1992  . Esophageal stricture   . ESRD on hemodialysis ([place])    ESRD due to HTN, started dialysis 2011 and gets  HD at [facility] with Dr Hinda Lenis on MWF schedule.  Access is LUA AVF as of Sept 2014.   Marland Kitchen GERD (gastroesophageal reflux disease)   . High cholesterol 12/2011  . History of blood transfusion 07/2011; 12/2011; 01/2012 X 2; 04/2012  . History of gout   . History of lower GI bleeding   . Hypertension   . Iron deficiency anemia   . Mitral regurgitation    a. Moderate by echo, 02/2012  . Myocardial infarction ([place])   . Ovarian cancer ([place]) 1992  . Pneumonia ~ 2009  . PUD (peptic ulcer disease)   . TIA (transient ischemic attack)     Patient Active Problem List   Diagnosis Date Noted  . Preoperative clearance 01/25/2017  . Symptomatic anemia 10/24/2016  . H/O non-ST elevation myocardial infarction (NSTEMI) 10/24/2016  . Fluid overload 10/10/2016  . Complication from renal dialysis device 10/10/2016  . Non-ST elevation MI (NSTEMI) ([place])   . Encounter for fitting and adjustment of vascular catheter   . End stage renal disease ([place])   . ESRD (end stage renal disease) ([place])   . Heme positive stool   . Demand ischemia ([place]) 07/27/2016  . Hypertensive emergency 07/08/2016  . Acute on chronic respiratory failure with hypoxia ([place])   . Cardiac arrest ([place])   .  Palliative care encounter   . Goals of care, counseling/discussion   . Hypertensive crisis without congestive heart failure 05/09/2016  . Acute pulmonary edema ([place]) 04/06/2016  . Acute respiratory failure ([place]) 04/06/2016  . Hypertensive crisis 01/27/2016  . History of colon cancer 01/27/2016  . History of ovarian cancer 01/27/2016  . Hypertensive urgency 01/27/2016  . Narrow complex tachycardia ([place]) 09/08/2015  . SVT (supraventricular tachycardia) ([place]) 09/08/2015  . Influenza A 08/30/2015  . Acute on chronic diastolic CHF (congestive heart failure) ([place]) 05/04/2015  . Unstable angina ([place]) 05/03/2015  . Essential hypertension   . Pain in joint, lower leg 08/14/2014  . Chest pain 11/26/2013  . Small bowel obstruction, partial  ([place]) 05/29/2013  . Chronic diastolic CHF (congestive heart failure) ([place]) 03/22/2013  . GI bleeding 03/21/2013  . Acute blood loss anemia 03/21/2013  . Vaginal odor 03/12/2013  . Vaginal discharge 03/12/2013  . Occlusion and stenosis of carotid artery without mention of cerebral infarction 01/24/2013  . Hx of CABG 07/05/2012  . Carotid artery disease ([place]) 07/05/2012  . Anemia of chronic kidney failure 07/03/2012  . Secondary hyperparathyroidism ([place]) 07/03/2012  . Mitral regurgitation 06/12/2012  . Pneumonia 06/09/2012  . Non-STEMI (non-ST elevated myocardial infarction) ([place]) 06/08/2012  . AVM (arteriovenous malformation) of small bowel, acquired ([place]) 01/20/2012  . GERD (gastroesophageal reflux disease) 01/09/2012  . HLD (hyperlipidemia) 01/05/2012  . Atherosclerosis of native coronary artery of native heart without angina pectoris 12/16/2011  . ESRD on hemodialysis ([place]) 12/16/2011    Past Surgical History:  Procedure Laterality Date  . ABDOMINAL HYSTERECTOMY  1992  . APPENDECTOMY  06/1990  . AV FISTULA PLACEMENT  07/2009   left upper arm  . AV FISTULA PLACEMENT Right 09/06/2016   Procedure: RIGHT FOREARM ARTERIOVENOUS (AV) GRAFT;  Surgeon: Elam Dutch, MD;  Location: [facility];  Service: Vascular;  Laterality: Right;  . [place] REMOVAL Right 09/06/2016   Procedure: REMOVAL OF Right Arm ARTERIOVENOUS GORETEX GRAFT and Vein Patch angioplasty of brachial artery;  Surgeon: Angelia Mould, MD;  Location: [facility];  Service: Vascular;  Laterality: Right;  . COLON RESECTION  1992  . COLON SURGERY    . CORONARY ANGIOPLASTY WITH STENT PLACEMENT  12/15/11   "2"  . CORONARY ANGIOPLASTY WITH STENT PLACEMENT  y/2013   "1; makes total of 3" (05/02/2012)  . CORONARY ARTERY BYPASS GRAFT  06/13/2012   Procedure: CORONARY ARTERY BYPASS GRAFTING (CABG);  Surgeon: Grace Isaac, MD;  Location: [facility];  Service: Open Heart Surgery;  Laterality: N/A;  cabg x four;  using left internal mammary  artery, and left leg greater saphenous vein harvested endoscopically  . CORONARY STENT INTERVENTION N/A 10/13/2016   Procedure: Coronary Stent Intervention;  Surgeon: Troy Sine, MD;  Location: [facility] CV LAB;  Service: Cardiovascular;  Laterality: N/A;  . DILATION AND CURETTAGE OF UTERUS    . ESOPHAGOGASTRODUODENOSCOPY  01/20/2012   Procedure: ESOPHAGOGASTRODUODENOSCOPY (EGD);  Surgeon: Ladene Artist, MD,FACG;  Location[facility] ENDOSCOPY;  Service: Endoscopy;  Laterality: N/A;  . ESOPHAGOGASTRODUODENOSCOPY N/A 03/26/2013   Procedure: ESOPHAGOGASTRODUODENOSCOPY (EGD);  Surgeon: Irene Shipper, MD;  Location: [facility] ENDOSCOPY;  Service: Endoscopy;  Laterality: N/A;  . ESOPHAGOGASTRODUODENOSCOPY N/A 04/30/2015   Procedure: ESOPHAGOGASTRODUODENOSCOPY (EGD);  Surgeon: Rogene Houston, MD;  [facility];  Service: Endoscopy;  Laterality: N/A;  1pm - moved to 10/20 @ 1:10  . ESOPHAGOGASTRODUODENOSCOPY N/A 07/29/2016   Procedure: ESOPHAGOGASTRODUODENOSCOPY (EGD);  Surgeon: Manus Gunning, MD;  Location: [facility]  ENDOSCOPY;  Service: Gastroenterology;  Laterality: N/A;  enteroscopy  . INTRAOPERATIVE TRANSESOPHAGEAL ECHOCARDIOGRAM  06/13/2012   Procedure: INTRAOPERATIVE TRANSESOPHAGEAL ECHOCARDIOGRAM;  Surgeon: Grace Isaac, MD;  Location: [facility];  Service: Open Heart Surgery;  Laterality: N/A;  . IR GENERIC HISTORICAL  07/26/2016   IR FLUORO GUIDE CV LINE RIGHT 07/26/2016 Greggory Keen, MD [facility]-INTERV RAD  . IR GENERIC HISTORICAL  07/26/2016   IR US GUIDE VASC ACCESS RIGHT 07/26/2016 Greggory Keen, MD [facility]  . IR GENERIC HISTORICAL  08/02/2016   IR US GUIDE VASC ACCESS RIGHT 08/02/2016 Greggory Keen, MD [facility]  . IR GENERIC HISTORICAL  08/02/2016   IR FLUORO GUIDE CV LINE RIGHT 08/02/2016 Greggory Keen, MD [facility]  . LEFT HEART CATH AND CORONARY ANGIOGRAPHY N/A 09/20/2016   Procedure: Left Heart Cath and Coronary Angiography;  Surgeon: [name], MD;  Location: [facility] CV  LAB;  Service: Cardiovascular;  Laterality: N/A;  . LEFT HEART CATH AND CORS/GRAFTS ANGIOGRAPHY N/A 10/13/2016   Procedure: Left Heart Cath and Cors/Grafts Angiography;  Surgeon: Troy Sine, MD;  Location: [facility] CV LAB;  Service: Cardiovascular;  Laterality: N/A;  . LEFT HEART CATHETERIZATION WITH CORONARY ANGIOGRAM N/A 12/15/2011   Procedure: LEFT HEART CATHETERIZATION WITH CORONARY ANGIOGRAM;  Surgeon: Burnell Blanks, MD;  Location: [facility] CATH LAB;  Service: Cardiovascular;  Laterality: N/A;  . LEFT HEART CATHETERIZATION WITH CORONARY ANGIOGRAM N/A 01/10/2012   Procedure: LEFT HEART CATHETERIZATION WITH CORONARY ANGIOGRAM;  Surgeon: Peter M Martinique, MD;  Location: [facility] CATH LAB;  Service: Cardiovascular;  Laterality: N/A;  . LEFT HEART CATHETERIZATION WITH CORONARY ANGIOGRAM N/A 06/08/2012   Procedure: LEFT HEART CATHETERIZATION WITH CORONARY ANGIOGRAM;  Surgeon: Burnell Blanks, MD;  Location: [facility] CATH LAB;  Service: Cardiovascular;  Laterality: N/A;  . LEFT HEART CATHETERIZATION WITH CORONARY/GRAFT ANGIOGRAM N/A 12/10/2013   Procedure: LEFT HEART CATHETERIZATION WITH Beatrix Fetters;  Surgeon: [name], MD;  Location: [facility] CATH LAB;  Service: Cardiovascular;  Laterality: N/A;  . OVARY SURGERY     ovarian cancer  . SHUNTOGRAM N/A 10/15/2013   Procedure: Fistulogram;  Surgeon: Serafina Mitchell, MD;  Location: [facility] CATH LAB;  Service: Cardiovascular;  Laterality: N/A;  . THROMBECTOMY / ARTERIOVENOUS GRAFT REVISION  2011   left upper arm  . TUBAL LIGATION  1980's  . UPPER EXTREMITY ANGIOGRAPHY Bilateral 12/06/2016   Procedure: Upper Extremity Angiography;  Surgeon: Katha Cabal, MD;  Location: [facility] CV LAB;  Service: Cardiovascular;  Laterality: Bilateral;    OB History    Gravida Para Term Preterm AB Living   2 2   2   2    SAB TAB Ectopic Multiple Live Births                   Home Medications    Prior to Admission medications   Medication Sig  Start Date End Date Taking? Authorizing Provider  ALPRAZolam (XANAX) 0.25 MG tablet Take 0.25 mg by mouth 2 (two) times daily as needed for anxiety or sleep.  11/19/15   [provider]  amiodarone (PACERONE) 200 MG tablet Take 1 tablet (200 mg total) by mouth daily. 12/08/16   Herminio Commons, MD  aspirin EC 81 MG tablet Take 81 mg by mouth daily.     [provider]  benzonatate (TESSALON) 100 MG capsule Take 1 capsule (100 mg total) by mouth every 8 (eight) hours. Patient taking differently: Take 100 mg by mouth 3 (three) times daily  as needed for cough.  11/18/16   Varney Biles, MD  carvedilol (COREG) 6.25 MG tablet Take 1 tablet (6.25 mg total) by mouth 2 (two) times daily with a meal. 11/16/16   Herminio Commons, MD  cinacalcet (SENSIPAR) 30 MG tablet Take 30 mg by mouth daily after supper.    [provider]  cloNIDine (CATAPRES) 0.2 MG tablet Take 1 tablet (0.2 mg total) by mouth 3 (three) times daily. 12/08/16   Herminio Commons, MD  epoetin alfa (EPOGEN,PROCRIT) 91638 UNIT/ML injection Inject 1 mL (10,000 Units total) into the vein every Monday, Wednesday, and Friday with hemodialysis. 04/11/16   Orvan Falconer, MD  fluticasone (FLONASE) 50 MCG/ACT nasal spray Place 2 sprays into the nose at bedtime as needed for allergies.     [provider]  hydrALAZINE (APRESOLINE) 50 MG tablet Take 2 tablets (100 mg total) by mouth 3 (three) times daily. Patient taking differently: Take 100 mg by mouth 3 (three) times daily as needed (for high blood pressure readings).  11/10/16 02/08/17  Herminio Commons, MD  isosorbide mononitrate (IMDUR) 120 MG 24 hr tablet Take 1 tablet (120 mg total) by mouth daily. 01/21/17   Caren Griffins, MD  loratadine (CLARITIN) 10 MG tablet Take 10 mg by mouth daily.     [provider]  multivitamin (RENA-VIT) TABS tablet Take 1 tablet by mouth daily.    [provider]  nitroGLYCERIN (NITROSTAT) 0.4 MG SL tablet  Place 1 tablet (0.4 mg total) under the tongue every 5 (five) minutes as needed for chest pain. 08/02/16   Kinnie Feil, MD  omeprazole (PRILOSEC) 20 MG capsule Take 20 mg by mouth daily.  09/12/16   [provider]  ondansetron (ZOFRAN-ODT) 4 MG disintegrating tablet Take 4 mg by mouth 2 (two) times daily as needed for nausea or vomiting.    [provider]  sevelamer carbonate (RENVELA) 800 MG tablet Take 800 mg by mouth 3 (three) times daily with meals.    [provider]  simvastatin (ZOCOR) 20 MG tablet Take 20 mg by mouth at bedtime.    [provider]  ticagrelor (BRILINTA) 90 MG TABS tablet Take 90 mg by mouth 2 (two) times daily.    [provider]    Family History Family History  Problem Relation Age of Onset  . Heart disease Mother        Heart Disease before age 4  . Hyperlipidemia Mother   . Hypertension Mother   . Diabetes Mother   . Heart attack Mother   . Heart disease Father        Heart Disease before age 10  . Hyperlipidemia Father   . Hypertension Father   . Diabetes Father   . Diabetes Sister   . Hypertension Sister   . Diabetes Brother   . Hyperlipidemia Brother   . Heart attack Brother   . Hypertension Sister   . Heart attack Brother   . Other Unknown        noncontributory for early CAD  . Colon cancer Neg Hx   . Esophageal cancer Neg Hx   . Liver disease Neg Hx   . Kidney disease Neg Hx   . Colon polyps Neg Hx     Social History Social History  Substance Use Topics  . Smoking status: Never Smoker  . Smokeless tobacco: Never Used  . Alcohol use No     Allergies   Aspirin; Penicillins; Bactrim [sulfamethoxazole-trimethoprim]; Contrast  media [iodinated diagnostic agents]; Iron; Nitrofurantoin; Prilosec [omeprazole]; Tylenol [acetaminophen]; Dexilant [dexlansoprazole]; Levaquin [levofloxacin in d5w]; Morphine and related; Plavix [clopidogrel bisulfate]; Protonix [pantoprazole sodium]; and Venofer  [ferric oxide]   Review of Systems Review of Systems  Constitutional: Negative for fever.  HENT: Negative for sore throat.   Eyes: Negative for redness.  Respiratory: Positive for shortness of breath. Negative for cough.   Cardiovascular: Positive for chest pain. Negative for palpitations and leg swelling.  Gastrointestinal: Negative for abdominal pain, nausea and vomiting.  Endocrine: Negative for polyuria.  Genitourinary: Negative for flank pain.  Musculoskeletal: Negative for back pain and neck pain.  Skin: Negative for rash.  Neurological: Negative for headaches.  Hematological: Does not bruise/bleed easily.  Psychiatric/Behavioral: Negative for confusion.     Physical Exam Updated Vital Signs BP (!) 242/97 (BP Location: Right Arm)   Pulse 70   Temp 98.7 F (37.1 C) (Oral)   Resp 18   Ht 1.575 m (5\' 2" )   Wt 54 kg (119 lb 0.8 oz)   SpO2 100%   BMI 21.77 kg/m   Physical Exam  Constitutional: She appears well-developed and well-nourished. No distress.  HENT:  Mouth/Throat: Oropharynx is clear and moist.  Eyes: Conjunctivae are normal. No scleral icterus.  Neck: Neck supple. No tracheal deviation present.  Cardiovascular: Normal rate, regular rhythm and intact distal pulses.  Exam reveals no gallop and no friction rub.   Murmur heard. Pulmonary/Chest: Effort normal and breath sounds normal. No respiratory distress. She exhibits no tenderness.  Right chest dialysis cath without sign of infection  Abdominal: Soft. Normal appearance and bowel sounds are normal. She exhibits no distension. There is no tenderness.  Genitourinary:  Genitourinary Comments: No cva tenderness. Yellowish/v light brown stool.   Musculoskeletal: She exhibits no edema or tenderness.  Neurological: She is alert.  Skin: Skin is warm and dry. No rash noted. She is not diaphoretic.  Psychiatric: She has a normal mood and affect.  Nursing note and vitals reviewed.    ED Treatments / Results    Labs (all labs ordered are listed, but only abnormal results are displayed) Results for orders placed or performed during the hospital encounter of 02/08/17  CBC with Differential/Platelet  Result Value Ref Range   WBC 5.9 4.0 - 10.5 K/uL   RBC 2.52 (L) 3.87 - 5.11 MIL/uL   Hemoglobin 8.0 (L) 12.0 - 15.0 g/dL   HCT 24.2 (L) 36.0 - 46.0 %   MCV 96.0 78.0 - 100.0 fL   MCH 31.7 26.0 - 34.0 pg   MCHC 33.1 30.0 - 36.0 g/dL   RDW 16.0 (H) 11.5 - 15.5 %   Platelets 195 150 - 400 K/uL   Neutrophils Relative % 65 %   Neutro Abs 3.9 1.7 - 7.7 K/uL   Lymphocytes Relative 16 %   Lymphs Abs 0.9 0.7 - 4.0 K/uL   Monocytes Relative 8 %   Monocytes Absolute 0.4 0.1 - 1.0 K/uL   Eosinophils Relative 10 %   Eosinophils Absolute 0.6 0.0 - 0.7 K/uL   Basophils Relative 1 %   Basophils Absolute 0.0 0.0 - 0.1 K/uL  Protime-INR  Result Value Ref Range   Prothrombin Time 13.3 11.4 - 15.2 seconds   INR 1.01   APTT  Result Value Ref Range   aPTT 32 24 - 36 seconds  Comprehensive metabolic panel  Result Value Ref Range   Sodium 131 (L) 135 - 145 mmol/L   Potassium 4.4 3.5 - 5.1 mmol/L  Chloride 96 (L) 101 - 111 mmol/L   CO2 22 22 - 32 mmol/L   Glucose, Bld 118 (H) 65 - 99 mg/dL   BUN 47 (H) 6 - 20 mg/dL   Creatinine, Ser 8.95 (H) 0.44 - 1.00 mg/dL   Calcium 8.0 (L) 8.9 - 10.3 mg/dL   Total Protein 5.3 (L) 6.5 - 8.1 g/dL   Albumin 2.9 (L) 3.5 - 5.0 g/dL   AST 17 15 - 41 U/L   ALT 10 (L) 14 - 54 U/L   Alkaline Phosphatase 99 38 - 126 U/L   Total Bilirubin 0.4 0.3 - 1.2 mg/dL   GFR calc non Af Amer 4 (L) >60 mL/min   GFR calc Af Amer 4 (L) >60 mL/min   Anion gap 13 5 - 15  Troponin I  Result Value Ref Range   Troponin I 0.03 (HH) <0.03 ng/mL  Lipid panel  Result Value Ref Range   Cholesterol 106 0 - 200 mg/dL   Triglycerides 113 <150 mg/dL   HDL 52 >40 mg/dL   Total CHOL/HDL Ratio 2.0 RATIO   VLDL 23 0 - 40 mg/dL   LDL Cholesterol 31 0 - 99 mg/dL  POC occult blood, ED Provider will  collect  Result Value Ref Range   Fecal Occult Bld POSITIVE (A) NEGATIVE   Dg Chest 2 View  Result Date: 01/20/2017 CLINICAL DATA:  76 year old female with chest pain and palpitation. EXAM: CHEST  2 VIEW COMPARISON:  Chest radiograph dated 10/12/2016 FINDINGS: Right-sided dialysis catheter with tip over the right atrium in stable positioning. The cardiac silhouette is within normal limits. Median sternotomy wires and postsurgical changes of CABG noted. There minimal bibasilar linear atelectasis/ scarring. No focal consolidation, pleural effusion, or pneumothorax. Overall improvement of the aeration of the lungs compared to prior radiograph. There is atherosclerotic calcification of the abdominal aorta and splenic artery. No acute osseous pathology. IMPRESSION: No active cardiopulmonary disease. Electronically Signed   By: Anner Crete M.D.   On: 01/20/2017 03:32   Dg Chest Port 1 View  Result Date: 02/08/2017 CLINICAL DATA:  Chest pain EXAM: PORTABLE CHEST 1 VIEW COMPARISON:  January 20, 2017 FINDINGS: There is slight scarring in the left base. Lungs elsewhere clear. Heart is mildly enlarged with pulmonary vascularity within normal limits. No adenopathy. There is aortic atherosclerosis. Central catheter tip is in the superior vena cava. No pneumothorax. No bone lesions. IMPRESSION: Slight scarring left base. No edema or consolidation. Stable cardiac prominence. There is aortic atherosclerosis. Aortic Atherosclerosis (ICD10-I70.0). Electronically Signed   By: Lowella Grip III M.D.   On: 02/08/2017 12:42    EKG  EKG Interpretation  Date/Time:  Wednesday February 08 2017 11:37:49 EDT Ventricular Rate:  85 PR Interval:    QRS Duration: 122 QT Interval:  427 QTC Calculation: 508 R Axis:   15 Text Interpretation:  Sinus rhythm Left bundle branch block No significant change since last tracing Confirmed by Lajean Saver [phone]) on 02/08/2017 12:42:50 PM       Radiology Dg Chest Port 1  View  Result Date: 02/08/2017 CLINICAL DATA:  Chest pain EXAM: PORTABLE CHEST 1 VIEW COMPARISON:  January 20, 2017 FINDINGS: There is slight scarring in the left base. Lungs elsewhere clear. Heart is mildly enlarged with pulmonary vascularity within normal limits. No adenopathy. There is aortic atherosclerosis. Central catheter tip is in the superior vena cava. No pneumothorax. No bone lesions. IMPRESSION: Slight scarring left base. No edema or consolidation. Stable cardiac prominence. There is aortic  atherosclerosis. Aortic Atherosclerosis (ICD10-I70.0). Electronically Signed   By: Lowella Grip III M.D.   On: 02/08/2017 12:42    Procedures Procedures (including critical care time)  Medications Ordered in ED Medications  nitroGLYCERIN 0.2 mg/mL in dextrose 5 % infusion (not administered)  0.9 %  sodium chloride infusion (not administered)  aspirin chewable tablet 324 mg (not administered)  heparin injection 3,250 Units (not administered)     Initial Impression / Assessment and Plan / ED Course  I have reviewed the triage vital signs and the nursing notes.  Pertinent labs & imaging results that were available during my care of the patient were reviewed by me and considered in my medical decision making (see chart for details).  Iv ns. o2 [place].  Reviewed nursing notes and prior charts for additional history.   Labs sent.  Cardiology/Dr Burt Knack did see in ED - indicates ecg unchanged from baseline. They will plan to admit.   Pt indicates pain remains resolved.   Pt is due for hd today.  Labs pending.   Pt states her wt was up this AM 1 kg, feels she needs dialysis, mild sob.  No chest pain. Trop neg.   hgb decreased from prior, pt denies any recent blood loss or melena. Stool grossly neg, sent for hemoccult testing.   Nephrology on call consulted re dialysis today.   Given decrease in hgb from prior, heme pos stools, need for hd, earlier symptoms (cp/sob), pt being admitted by  cardiology.     Final Clinical Impressions(s) / ED Diagnoses   Final diagnoses:  None    New Prescriptions New Prescriptions   No medications on file           Lajean Saver, MD 02/08/17 1517

## 2017-02-08 NOTE — Progress Notes (Signed)
ANTICOAGULATION CONSULT NOTE - Initial Consult  Pharmacy Consult for Heparin Indication: chest pain/ACS  Allergies  Allergen Reactions  . Aspirin Other (See Comments)    High Doses Mess up her stomach; "makes my bowels have blood in them". Takes 81 mg EC Aspirin   . Penicillins Other (See Comments)    SYNCOPE? , "makes me real weak when I take it; like I'll pass out"  Has patient had a PCN reaction causing immediate rash, facial/tongue/throat swelling, SOB or lightheadedness with hypotension: Yes Has patient had a PCN reaction causing severe rash involving mucus membranes or skin necrosis: no Has patient had a PCN reaction that required hospitalization no Has patient had a PCN reaction occurring within the last 10 years: no If all of the above  . Amlodipine Swelling  . Bactrim [Sulfamethoxazole-Trimethoprim] Rash  . Contrast Media [Iodinated Diagnostic Agents] Itching  . Iron Itching and Other (See Comments)    "they gave me iron in dialysis; had to give me Benadryl cause I had to have the iron" (05/02/2012)  . Nitrofurantoin Hives  . Prilosec [Omeprazole] Other (See Comments)    "back spasms"  . Tylenol [Acetaminophen] Itching and Other (See Comments)    Makes her feet on fire per pt  . Gabapentin Other (See Comments)    unspecified  . Dexilant [Dexlansoprazole] Other (See Comments)    Upset stomach  . Levaquin [Levofloxacin In D5w] Rash  . Morphine And Related Itching    Itching in feet  . Plavix [Clopidogrel Bisulfate] Rash  . Protonix [Pantoprazole Sodium] Rash  . Venofer [Ferric Oxide] Itching    Patient reports using Benadryl prior to doses as [facility]    Patient Measurements: Height: 5\' 2"  (157.5 cm) Weight: 119 lb 0.8 oz (54 kg) IBW/kg (Calculated) : 50.1  Vital Signs: Temp: 98.7 F (37.1 C) (08/01 1154) Temp Source: Oral (08/01 1154) BP: 190/80 (08/01 1203) Pulse Rate: 62 (08/01 1410)  Labs:  Recent Labs  02/08/17 1205  HGB 8.0*  HCT 24.2*   PLT 195  APTT 32  LABPROT 13.3  INR 1.01  CREATININE 8.95*  TROPONINI 0.03*    Estimated Creatinine Clearance: 4.2 mL/min (A) (by C-G formula based on SCr of 8.95 mg/dL (H)).   Medical History: Past Medical History:  Diagnosis Date  . Acute on chronic respiratory failure with hypoxia ([place]) 10/10/2016  . Anxiety   . Arthritis   . AVM (arteriovenous malformation) of colon   . CAD (coronary artery disease)    a. 12/2011 NSTEMI/Cath/PCI LCX (2.25x14 Resolute DES) & D1 (2.25x22 Resolute DES);  b. 01/2012 Cath/PCI: LM 30, LAD 30p, 40-64m, D1 stent ok, 99 in sm branch of diag, LCX patent stent, OM1 20, RCA 95 ost (4.0x12 Promus DES), EF 55%;  c. 04/2012 Lexi Cardiolite  EF 48%, small area of scar @ base/mid inflat wall with mild peri-infarct ischemia.; CABG 12/4  . Carotid artery disease ([place])    a. 66-44% LICA, 0/3474   . Chronic bronchitis ([place])   . Chronic diastolic CHF (congestive heart failure) ([place])    a. 02/2012 Echo EF 60-65%, nl wall motion, Gr 1 DD, mod MR  . Colon cancer ([place]) 1992  . Esophageal stricture   . ESRD on hemodialysis ([place])    ESRD due to HTN, started dialysis 2011 and gets HD at [facility] with Dr Hinda Lenis on MWF schedule.  Access is LUA AVF as of Sept 2014.   Marland Kitchen GERD (gastroesophageal reflux disease)   . High cholesterol  12/2011  . History of blood transfusion 07/2011; 12/2011; 01/2012 X 2; 04/2012  . History of gout   . History of lower GI bleeding   . Hypertension   . Iron deficiency anemia   . Mitral regurgitation    a. Moderate by echo, 02/2012  . Myocardial infarction ([place])   . Ovarian cancer ([place]) 1992  . Pneumonia ~ 2009  . PUD (peptic ulcer disease)   . TIA (transient ischemic attack)     Medications:   (Not in a hospital admission)  Assessment: 24 YOF here with unstable angina. Pharmacy consulted to start IV heparin with plan for possible PCI tomorrow. Patient has a history of Afib but not a candidate for oral anticoagulation per cardiology due  to history of GI bleeding. Initial trop 0.03. H/H 8/24.2, Plt wnl.  Goal of Therapy:  Heparin level 0.3-0.7 units/ml (aim for lower end of target) Monitor platelets by anticoagulation protocol: Yes   Plan:  -Give heparin 3000 units bolus, then start heparin infusion at 600 units/hr.  -F/u 8 hr heparin level. Will aim for lower end of target range -Monitor daily CBC, HL and s/s of bleeding  Albertina Parr, PharmD., BCPS Clinical Pharmacist Pager [phone]

## 2017-02-08 NOTE — ED Notes (Signed)
Nephrology at bedside

## 2017-02-08 NOTE — H&P (Signed)
Cardiology Admission History and Physical:   Patient ID: Shawna [name]; MRN: [id number]; DOB: Mar 13, 1941   Admission date: 02/08/2017  Primary Care Provider: [name], MD Primary Cardiologist: Kate Sable Primary Electrophysiologist:  none  Chief Complaint:  Chest pain  Patient Profile:   Shawna [name] is a 76 y.o. female with a history of extensive CAD and ESRD presenting with chest pain  History of Present Illness:   Shawna [name] as a "code STEMI."  The patient awoke in the middle the night with discomfort in her upper back that felt like her typical anginal pain. Her pain waxed and waned through the morning. She also experienced a dull pressure in the center of her chest. All of this was similar to her cardiac pain in the past. She went to outpatient hemodialysis and EMS was called because she was complaining of chest discomfort. She did not have dialysis today. Her last dialysis session was Monday.  On arrival the patient is still having mild chest discomfort. She did have improvement with sublingual nitroglycerin and aspirin in route. Her EKG is unchanged and the code STEMI is canceled. She has mild shortness of breath. Denies nausea, vomiting, or diaphoresis. No lightheadedness or syncope. States her blood pressure was a proximally 160/80 when she left her house this morning.  She had a similar admission 01/21/2017 with hypertensive urgency. She had run out of her antihypertensive medications and she was started back on antihypertensive therapy. No cardiology consult was obtained during that admission. She had undergone most recent PCI in April 2018 with overlapping drug-eluting stents in the diagonal branch. She has had previous CABG with patent grafts to the mid LAD, intermediate, and distal RCA. LV function has been normal with most recent assessment showing an LVEF of 50-55%. The patient has had paroxysmal atrial fibrillation but she is not  anticoagulated because of history of GI bleeding secondary to AVMs.   Past Medical History:  Diagnosis Date  . Acute on chronic respiratory failure with hypoxia ([place]) 10/10/2016  . Anxiety   . Arthritis   . AVM (arteriovenous malformation) of colon   . CAD (coronary artery disease)    a. 12/2011 NSTEMI/Cath/PCI LCX (2.25x14 Resolute DES) & D1 (2.25x22 Resolute DES);  b. 01/2012 Cath/PCI: LM 30, LAD 30p, 40-73m, D1 stent ok, 99 in sm branch of diag, LCX patent stent, OM1 20, RCA 95 ost (4.0x12 Promus DES), EF 55%;  c. 04/2012 Lexi Cardiolite  EF 48%, small area of scar @ base/mid inflat wall with mild peri-infarct ischemia.; CABG 12/4  . Carotid artery disease ([place])    a. 78-29% LICA, 11/6211   . Chronic bronchitis ([place])   . Chronic diastolic CHF (congestive heart failure) ([place])    a. 02/2012 Echo EF 60-65%, nl wall motion, Gr 1 DD, mod MR  . Colon cancer ([place]) 1992  . Esophageal stricture   . ESRD on hemodialysis ([place])    ESRD due to HTN, started dialysis 2011 and gets HD at [facility] with Dr Hinda Lenis on MWF schedule.  Access is LUA AVF as of Sept 2014.   Marland Kitchen GERD (gastroesophageal reflux disease)   . High cholesterol 12/2011  . History of blood transfusion 07/2011; 12/2011; 01/2012 X 2; 04/2012  . History of gout   . History of lower GI bleeding   . Hypertension   . Iron deficiency anemia   . Mitral regurgitation    a. Moderate by echo, 02/2012  . Myocardial infarction (  [place])   . Ovarian cancer ([place]) 1992  . Pneumonia ~ 2009  . PUD (peptic ulcer disease)   . TIA (transient ischemic attack)     Past Surgical History:  Procedure Laterality Date  . ABDOMINAL HYSTERECTOMY  1992  . APPENDECTOMY  06/1990  . AV FISTULA PLACEMENT  07/2009   left upper arm  . AV FISTULA PLACEMENT Right 09/06/2016   Procedure: RIGHT FOREARM ARTERIOVENOUS (AV) GRAFT;  Surgeon: Elam Dutch, MD;  Location: [place];  Service: Vascular;  Laterality: Right;  . [place] REMOVAL Right 09/06/2016   Procedure: REMOVAL OF  Right Arm ARTERIOVENOUS GORETEX GRAFT and Vein Patch angioplasty of brachial artery;  Surgeon: Angelia Mould, MD;  Location: [place];  Service: Vascular;  Laterality: Right;  . COLON RESECTION  1992  . COLON SURGERY    . CORONARY ANGIOPLASTY WITH STENT PLACEMENT  12/15/11   "2"  . CORONARY ANGIOPLASTY WITH STENT PLACEMENT  y/2013   "1; makes total of 3" (05/02/2012)  . CORONARY ARTERY BYPASS GRAFT  06/13/2012   Procedure: CORONARY ARTERY BYPASS GRAFTING (CABG);  Surgeon: Grace Isaac, MD;  Location: [place];  Service: Open Heart Surgery;  Laterality: N/A;  cabg x four;  using left internal mammary artery, and left leg greater saphenous vein harvested endoscopically  . CORONARY STENT INTERVENTION N/A 10/13/2016   Procedure: Coronary Stent Intervention;  Surgeon: Troy Sine, MD;  Location: [place] CV LAB;  Service: Cardiovascular;  Laterality: N/A;  . DILATION AND CURETTAGE OF UTERUS    . ESOPHAGOGASTRODUODENOSCOPY  01/20/2012   Procedure: ESOPHAGOGASTRODUODENOSCOPY (EGD);  Surgeon: Ladene Artist, MD,FACG;  Location: [facility] ENDOSCOPY;  Service: Endoscopy;  Laterality: N/A;  . ESOPHAGOGASTRODUODENOSCOPY N/A 03/26/2013   Procedure: ESOPHAGOGASTRODUODENOSCOPY (EGD);  Surgeon: Irene Shipper, MD;  Location: [facility] ENDOSCOPY;  Service: Endoscopy;  Laterality: N/A;  . ESOPHAGOGASTRODUODENOSCOPY N/A 04/30/2015   Procedure: ESOPHAGOGASTRODUODENOSCOPY (EGD);  Surgeon: Rogene Houston, MD;  [facility];  Service: Endoscopy;  Laterality: N/A;  1pm - moved to 10/20 @ 1:10  . ESOPHAGOGASTRODUODENOSCOPY N/A 07/29/2016   Procedure: ESOPHAGOGASTRODUODENOSCOPY (EGD);  Surgeon: Manus Gunning, MD;  Location: [place];  Service: Gastroenterology;  Laterality: N/A;  enteroscopy  . INTRAOPERATIVE TRANSESOPHAGEAL ECHOCARDIOGRAM  06/13/2012   Procedure: INTRAOPERATIVE TRANSESOPHAGEAL ECHOCARDIOGRAM;  Surgeon: Grace Isaac, MD;  Location: [place];  Service: Open Heart Surgery;  Laterality: N/A;   . IR GENERIC HISTORICAL  07/26/2016   IR FLUORO GUIDE CV LINE RIGHT 07/26/2016 Greggory Keen, MD [facility]  . IR GENERIC HISTORICAL  07/26/2016   IR US GUIDE VASC ACCESS RIGHT 07/26/2016 Greggory Keen, MD [facility]  . IR GENERIC HISTORICAL  08/02/2016   IR US GUIDE VASC ACCESS RIGHT 08/02/2016 Greggory Keen, MD [facility]  . IR GENERIC HISTORICAL  08/02/2016   IR FLUORO GUIDE CV LINE RIGHT 08/02/2016 Greggory Keen, MD [facility]  . LEFT HEART CATH AND CORONARY ANGIOGRAPHY N/A 09/20/2016   Procedure: Left Heart Cath and Coronary Angiography;  Surgeon: [name], MD;  Location: [place] CV LAB;  Service: Cardiovascular;  Laterality: N/A;  . LEFT HEART CATH AND CORS/GRAFTS ANGIOGRAPHY N/A 10/13/2016   Procedure: Left Heart Cath and Cors/Grafts Angiography;  Surgeon: Troy Sine, MD;  Location: [place] CV LAB;  Service: Cardiovascular;  Laterality: N/A;  . LEFT HEART CATHETERIZATION WITH CORONARY ANGIOGRAM N/A 12/15/2011   Procedure: LEFT HEART CATHETERIZATION WITH CORONARY ANGIOGRAM;  Surgeon: Burnell Blanks, MD;  Location: [facility] CATH LAB;  Service: Cardiovascular;  Laterality: N/A;  . LEFT HEART CATHETERIZATION WITH CORONARY ANGIOGRAM N/A 01/10/2012   Procedure: LEFT HEART CATHETERIZATION WITH CORONARY ANGIOGRAM;  Surgeon: Peter M Martinique, MD;  Location: [facility] CATH LAB;  Service: Cardiovascular;  Laterality: N/A;  . LEFT HEART CATHETERIZATION WITH CORONARY ANGIOGRAM N/A 06/08/2012   Procedure: LEFT HEART CATHETERIZATION WITH CORONARY ANGIOGRAM;  Surgeon: Burnell Blanks, MD;  Location: [facility] CATH LAB;  Service: Cardiovascular;  Laterality: N/A;  . LEFT HEART CATHETERIZATION WITH CORONARY/GRAFT ANGIOGRAM N/A 12/10/2013   Procedure: LEFT HEART CATHETERIZATION WITH Beatrix Fetters;  Surgeon: [name], MD;  Location: [facility] CATH LAB;  Service: Cardiovascular;  Laterality: N/A;  . OVARY SURGERY     ovarian cancer  . SHUNTOGRAM N/A 10/15/2013   Procedure: Fistulogram;   Surgeon: Serafina Mitchell, MD;  Location: [facility] CATH LAB;  Service: Cardiovascular;  Laterality: N/A;  . THROMBECTOMY / ARTERIOVENOUS GRAFT REVISION  2011   left upper arm  . TUBAL LIGATION  1980's  . UPPER EXTREMITY ANGIOGRAPHY Bilateral 12/06/2016   Procedure: Upper Extremity Angiography;  Surgeon: Katha Cabal, MD;  Location: [place] CV LAB;  Service: Cardiovascular;  Laterality: Bilateral;     Medications Prior to Admission: Prior to Admission medications   Medication Sig Start Date End Date Taking? Authorizing Provider  ALPRAZolam (XANAX) 0.25 MG tablet Take 0.25 mg by mouth 2 (two) times daily as needed for anxiety or sleep.  11/19/15   [provider]  amiodarone (PACERONE) 200 MG tablet Take 1 tablet (200 mg total) by mouth daily. 12/08/16   Herminio Commons, MD  aspirin EC 81 MG tablet Take 81 mg by mouth daily.     [provider]  benzonatate (TESSALON) 100 MG capsule Take 1 capsule (100 mg total) by mouth every 8 (eight) hours. Patient taking differently: Take 100 mg by mouth 3 (three) times daily as needed for cough.  11/18/16   Varney Biles, MD  carvedilol (COREG) 6.25 MG tablet Take 1 tablet (6.25 mg total) by mouth 2 (two) times daily with a meal. 11/16/16   Herminio Commons, MD  cinacalcet (SENSIPAR) 30 MG tablet Take 30 mg by mouth daily after supper.    [provider]  cloNIDine (CATAPRES) 0.2 MG tablet Take 1 tablet (0.2 mg total) by mouth 3 (three) times daily. 12/08/16   Herminio Commons, MD  epoetin alfa (EPOGEN,PROCRIT) 33007 UNIT/ML injection Inject 1 mL (10,000 Units total) into the vein every Monday, Wednesday, and Friday with hemodialysis. 04/11/16   Orvan Falconer, MD  fluticasone (FLONASE) 50 MCG/ACT nasal spray Place 2 sprays into the nose at bedtime as needed for allergies.     [provider]  hydrALAZINE (APRESOLINE) 50 MG tablet Take 2 tablets (100 mg total) by mouth 3 (three) times daily. Patient taking  differently: Take 100 mg by mouth 3 (three) times daily as needed (for high blood pressure readings).  11/10/16 02/08/17  Herminio Commons, MD  isosorbide mononitrate (IMDUR) 120 MG 24 hr tablet Take 1 tablet (120 mg total) by mouth daily. 01/21/17   Caren Griffins, MD  loratadine (CLARITIN) 10 MG tablet Take 10 mg by mouth daily.     [provider]  multivitamin (RENA-VIT) TABS tablet Take 1 tablet by mouth daily.    [provider]  nitroGLYCERIN (NITROSTAT) 0.4 MG SL tablet Place 1 tablet (0.4 mg total) under the tongue every 5 (five) minutes as needed for chest pain. 08/02/16   Kinnie Feil,  MD  omeprazole (PRILOSEC) 20 MG capsule Take 20 mg by mouth daily.  09/12/16   [provider]  ondansetron (ZOFRAN-ODT) 4 MG disintegrating tablet Take 4 mg by mouth 2 (two) times daily as needed for nausea or vomiting.    [provider]  sevelamer carbonate (RENVELA) 800 MG tablet Take 800 mg by mouth 3 (three) times daily with meals.    [provider]  simvastatin (ZOCOR) 20 MG tablet Take 20 mg by mouth at bedtime.    [provider]  ticagrelor (BRILINTA) 90 MG TABS tablet Take 90 mg by mouth 2 (two) times daily.    [provider]     Allergies:    Allergies  Allergen Reactions  . Aspirin Other (See Comments)    High Doses Mess up her stomach; "makes my bowels have blood in them". Takes 81 mg EC Aspirin   . Penicillins Other (See Comments)    SYNCOPE? , "makes me real weak when I take it; like I'll pass out"  Has patient had a PCN reaction causing immediate rash, facial/tongue/throat swelling, SOB or lightheadedness with hypotension: Yes Has patient had a PCN reaction causing severe rash involving mucus membranes or skin necrosis: no Has patient had a PCN reaction that required hospitalization no Has patient had a PCN reaction occurring within the last 10 years: no If all of the above  . Bactrim  [Sulfamethoxazole-Trimethoprim] Rash  . Contrast Media [Iodinated Diagnostic Agents] Itching  . Iron Itching and Other (See Comments)    "they gave me iron in dialysis; had to give me Benadryl cause I had to have the iron" (05/02/2012)  . Nitrofurantoin Hives  . Prilosec [Omeprazole] Other (See Comments)    "back spasms"  . Tylenol [Acetaminophen] Itching and Other (See Comments)    Makes her feet on fire per pt  . Dexilant [Dexlansoprazole] Other (See Comments)    Upset stomach  . Levaquin [Levofloxacin In D5w] Rash  . Morphine And Related Itching    Itching in feet  . Plavix [Clopidogrel Bisulfate] Rash  . Protonix [Pantoprazole Sodium] Rash  . Venofer [Ferric Oxide] Itching    Patient reports using Benadryl prior to doses as [place] History:   Social History   Social History  . Marital status: Married    Spouse name: N/A  . Number of children: N/A  . Years of education: N/A   Occupational History  . Not on file.   Social History Main Topics  . Smoking status: Never Smoker  . Smokeless tobacco: Never Used  . Alcohol use No  . Drug use: No  . Sexual activity: Yes    Birth control/ protection: Surgical   Other Topics Concern  . Not on file   Social History Narrative   Lives in [place], [place] with husband.  Dialysis pt - [facility].    Family History:   The patient's family history includes Diabetes in her brother, father, mother, and sister; Heart attack in her brother, brother, and mother; Heart disease in her father and mother; Hyperlipidemia in her brother, father, and mother; Hypertension in her father, mother, sister, and sister; Other in her unknown relative. There is no history of Colon cancer, Esophageal cancer, Liver disease, Kidney disease, or Colon polyps.    ROS:  Please see the history of present illness.  All other ROS reviewed and negative.     Physical Exam/Data:   Vitals:   02/08/17 1154 02/08/17 1155 02/08/17 1203  BP: (!) 242/97  (!)  190/80  Pulse: 70  71  Resp: 18  16  Temp: 98.7 F (37.1 C)    TempSrc: Oral    SpO2: 100%  100%  Weight:  119 lb 0.8 oz (54 kg)   Height:  5\' 2"  (1.575 m)    No intake or output data in the 24 hours ending 02/08/17 1333 Filed Weights   02/08/17 1155  Weight: 119 lb 0.8 oz (54 kg)   Body mass index is 21.77 kg/m.  General:  Well nourished, well developed, in no acute distress HEENT: normal Lymph: no adenopathy Neck: no JVD Endocrine:  No thryomegaly Vascular: No carotid bruits; FA pulses 2+ bilaterally  Cardiac:  normal S1, S2; Grade 2/6 systolic ejection murmur at the right upper sternal border, positive S4 gallop Lungs:  clear to auscultation bilaterally, no wheezing, rhonchi or rales  Abd: soft, nontender, no hepatomegaly  Ext: no edema, AV fistula in left upper arm with no active thrill. There is a permacath in the right chest Musculoskeletal:  No deformities, BUE and BLE strength normal and equal Skin: warm and dry  Neuro:  CNs 2-12 intact, no focal abnormalities noted Psych:  Normal affect    EKG:  The ECG that was done  was personally reviewed and demonstrates sinus rhythm, left bundle branch block, nonspecific ST segment changes with inferolateral ST depression unchanged from previous tracing  Relevant CV Studies: Cardiac catheterization 24-Oct-2016: Conclusion     Ramus lesion, 90 %stenosed.  Dist LAD lesion, 80 %stenosed.  LIMA.  SVG.  Ost RCA to Prox RCA lesion, 100 %stenosed.  SVG.  Ost Cx to Prox Cx lesion, 90 %stenosed.  Mid LAD lesion, 75 %stenosed.  A STENT RESOLUTE ONYX 2.29N98 drug eluting stent was successfully placed, and overlaps previously placed stent.  Ost 1st Diag to 1st Diag lesion, 99 %stenosed.  Post intervention, there is a 0% residual stenosis.  1st Diag lesion, 20 %stenosed.   Severe multivessel native CAD with 99.9% ostial proximal diagonal 1 stenosis in a vessel which had been previously stented and now has TIMI 1 flow,  85% LAD stenosis immediately after the takeoff of this first diagonal vessel and 80% mid LAD stenosis just proximal to the insertion of the LIMA graft.  Ramus intermediate 90% ostial stenosis with competitive filling due a patent SVG.  Ostial Left circumflex 90% in-stent restenosis and occlusion of a marginal branch beyond the stented segment.  Total occlusion of a stent at the ostium of the RCA.  Patent LIMA graft supplying the mid distal LAD with insertion beyond the mid 80% stenosis.  Patent SVG supplying the ramus intermediate vessel with 30% narrowing just beyond the anastomosis.  Patent SVG supplying the distal RCA which fills the entire RCA distally and antegrade up to the occluded ostial stent.  Successful percutaneous coronary intervention to the subtotally occluded diagonal vessel with the 99.9%  stenosis being reduced to 0% with PTCA and insertion of a 2.2515 mm Resolute Onyx DES stent into the previously placed stent post dilated to 2.45 mm and with the stenosis being reduced to 0% in the entire stented segment.  The sluggish TIMI-1 flow at the start of the procedure was improved to brisk TIMI-3 flow in the diagonal vessel following PCI. The LAD stenosis immediately after the diagonal vessel was 80% stenosed at the completion of the procedure.  RECOMMENDATION: Brillinta was used for antiplatelet therapy during this procedure due to the patient's reported rash to Plavix.  She  will need to continue dual antiplatelet therapy.  Medical therapy for concomitant CAD.   Indications   Non-ST elevation (NSTEMI) myocardial infarction (HCC) [I21.4 (ICD-10-CM)]  Procedural Details/Technique   Technical Details B marker is a 76 year old American female who has end-stage renal disease on dialysis. In June 2013 she suffered a non-STEMI and underwent PCI to her ostial circumflex as well as diagonal 1 vessel. Point had undergone intervention to the ostium of RCA with stenting. She  underwent CABG revascularization surgery December 2003 and had a LIMA placed to the mid LAD, SVG to the ramus vessel, and SVG to the circumflex marginal, and SVG to the RCA. In March 2018 catheterization after developing STEMI. Catheterization revealed 99% stenosis within stent restenosis in the diagonal was felt to be a small vessel. Critically with history of the lead and need for anticoagulation. Increasing chest pain and hypertension and has troponins to 23.73. She is referred for repeat catheterization and possible intervention to the diagonal vessel.  After demonstrating sterile fashion. She was premedicated with Versed 1 mg and fentanyl 25 g. Right femoral artery was noted anteriorly and 5 French sheath was a surrogate without difficulty. Diagnostic catheterization was done with 5 French Judkins 4 left and right coronary catheters. The right cath was also used for selective angiography into the vein grafts as well as the left internal artery. 2 measure LV pressure recordings. 90% stenosis in the diagonal vessel net with TIMI 1 flow and intervention. Due to poor IV access, a right femoral venous sheath was inserted without difficulty. Angiomax bolus and infusion was administered. Brilinta 180 mg was given orally. The arterial sheath was upgraded to a 6 [place] sheath. 6 [place] XB LAD 3.5 guide was used for the procedure. A Choice PT moderate support wire was inserted and this required balloon support with a 2.010 mm balloon totally crossed the 99.9% subtotal stenosis then made ostially 2515 mm Resolute Onyx DES stent was then inserted with careful attention to cover the ostium of the diagonal and not place the stent in the LAD. Dilatation was done with a 2.515 mm noncompliant balloon. Angiography confirmed an excellent result in the diagonal vessel. The LAD beyond the diagonal takeoff had previous 75% stenosis and this appeared to be slightly increased to 80%. The patient tolerated the procedure well.  Angiomax was discontinued when the patient left the catheterization laboratory with plans for sheath removal in 2 hours. She left the catheterization laboratory chest pain free with stable hemodynamics.   Estimated blood loss <50 mL.  During this procedure the patient was administered the following to achieve and maintain moderate conscious sedation: Versed 1 mg, Fentanyl 25 mcg, while the patient's heart rate, blood pressure, and oxygen saturation were continuously monitored. The period of conscious sedation was 82 minutes, of which I was present face-to-face 100% of this time.    Coronary Findings   Dominance: Right  Left Anterior Descending  Mid LAD lesion, 75% stenosed.  Dist LAD lesion, 80% stenosed.  First Diagonal Branch  Ost 1st Diag to 1st Diag lesion, 99% stenosed. The lesion was previously treated.  Angioplasty: Pre-stent angioplasty was performed using a BALLOON EUPHORA RX 2.0X10. A STENT RESOLUTE ONYX 7.16R67 drug eluting stent was successfully placed, and overlaps previously placed stent. Stent strut is well apposed. Post-stent angioplasty was performed using a BALLOON [place] MOZEC 2.5X15. The pre-interventional distal flow is decreased (TIMI 1). The post-interventional distal flow is normal (TIMI 3). The intervention was successful . No complications occurred at this lesion.  There is no residual stenosis post intervention.  1st Diag lesion, 20% stenosed. The lesion was previously treated.  Ramus Intermedius  Ramus lesion, 90% stenosed.  Left Circumflex  Vessel is small.  Ost Cx to Prox Cx lesion, 90% stenosed. The lesion was previously treated.  Right Coronary Artery  Ost RCA to Prox RCA lesion, 100% stenosed. The lesion was previously treated.  Graft Angiography  Free LIMA Graft to Dist LAD  LIMA.  saphenous Graft to Ramus  SVG.  Graft to 2nd Mrg  saphenous Graft to Dist RCA  SVG.  Left Heart   Left Ventricle LVEDP 12 mm Hg.    Coronary Diagrams   Diagnostic Diagram        Post-Intervention Diagram         2-D echocardiogram 10/12/2016: Study Conclusions  - Left ventricle: The cavity size was normal. There was moderate   concentric hypertrophy. Systolic function was normal. The   estimated ejection fraction was in the range of 50% to 55%. Wall   motion was normal; there were no regional wall motion   abnormalities. Doppler parameters are consistent with abnormal   left ventricular relaxation (grade 1 diastolic dysfunction).   Doppler parameters are consistent with elevated ventricular   end-diastolic filling pressure. - Aortic valve: Trileaflet; normal thickness leaflets. There was no   regurgitation. - Aortic root: The aortic root was normal in size. - Mitral valve: Calcified annulus. Mildly thickened leaflets .   There was mild regurgitation. - Left atrium: The atrium was normal in size. - Right ventricle: Systolic function was normal. - Tricuspid valve: There was no regurgitation. - Pulmonary arteries: Systolic pressure could not be accurately   estimated. - Inferior vena cava: The vessel was normal in size. - Pericardium, extracardiac: There was no pericardial effusion.  Laboratory Data:  ChemistryNo results for input(s): NA, K, CL, CO2, GLUCOSE, BUN, CREATININE, CALCIUM, GFRNONAA, GFRAA, ANIONGAP in the last 168 hours.  No results for input(s): PROT, ALBUMIN, AST, ALT, ALKPHOS, BILITOT in the last 168 hours. HematologyNo results for input(s): WBC, RBC, HGB, HCT, MCV, MCH, MCHC, RDW, PLT in the last 168 hours. Cardiac EnzymesNo results for input(s): TROPONINI in the last 168 hours. No results for input(s): TROPIPOC in the last 168 hours.  BNPNo results for input(s): BNP, PROBNP in the last 168 hours.  DDimer No results for input(s): DDIMER in the last 168 hours.  Radiology/Studies:  Dg Chest Port 1 View  Result Date: 02/08/2017 CLINICAL DATA:  Chest pain EXAM: PORTABLE CHEST 1 VIEW COMPARISON:  January 20, 2017 FINDINGS: There is  slight scarring in the left base. Lungs elsewhere clear. Heart is mildly enlarged with pulmonary vascularity within normal limits. No adenopathy. There is aortic atherosclerosis. Central catheter tip is in the superior vena cava. No pneumothorax. No bone lesions. IMPRESSION: Slight scarring left base. No edema or consolidation. Stable cardiac prominence. There is aortic atherosclerosis. Aortic Atherosclerosis (ICD10-I70.0). Electronically Signed   By: Lowella Grip III M.D.   On: 02/08/2017 12:42    Assessment and Plan:   1. Unstable angina: We will trend cardiac enzymes. Will start IV heparin and manage blood pressure aggressively. Anticipate cardiac catheterization and possible PCI tomorrow. Her blood pressure was not uncontrolled when her symptoms started at home based on her report. I think that she will likely require repeat cardiac catheterization make sure her anatomy is stable and her bypass grafts are patent. This is her second admission this month with chest pain and hypertension. She has been  compliant with her medications at the time of this admission. IV nitroglycerin will be started to manage her chest discomfort. If she remains with chest pain will perform cardiac catheterization more urgently.  2. End-stage renal disease: We'll consult nephrology for dialysis  3. Malignant hypertension with hypertensive urgency: Will begin IV nitroglycerin to help control chest pain and blood pressure. Otherwise continue her complex regimen which includes carvedilol, hydralazine, isosorbide, and clonidine.  4. Hyperlipidemia: Treated with simvastatin. Would change to a high intensity statin drug like atorvastatin 80 mg.  5. Paroxysmal atrial fibrillation: The patient is in sinus rhythm. Her rhythm is controlled with amiodarone. She is not a candidate for oral anticoagulation because of AVMs and history of GI bleeding.  Disposition: Requires inpatient admission. Anticipate cardiac catheterization  and possible PCI tomorrow.  Severity of Illness: The appropriate patient status for this patient is INPATIENT. Inpatient status is judged to be reasonable and necessary in order to provide the required intensity of service to ensure the patient's safety. The patient's presenting symptoms, physical exam findings, and initial radiographic and laboratory data in the context of their chronic comorbidities is felt to place them at high risk for further clinical deterioration. Furthermore, it is not anticipated that the patient will be medically stable for discharge from the hospital within 2 midnights of admission. The following factors support the patient status of inpatient.   " The patient's presenting symptoms include chest pain. " The worrisome physical exam findings include severe hypertension and S4 gallop. " The chronic co-morbidities include end-stage renal disease, extensive coronary artery disease.  * I certify that at the point of admission it is my clinical judgment that the patient will require inpatient hospital care spanning beyond 2 midnights from the point of admission due to high intensity of service, high risk for further deterioration and high frequency of surveillance required.Deatra James, MD  02/08/2017 1:33 PM

## 2017-02-08 NOTE — Progress Notes (Signed)
Unable to establish peripheral IV access with U/S.   RN notified.  Will need renal MD notification to access [facility].   Staff may insert an EJ catheter.

## 2017-02-08 NOTE — ED Notes (Signed)
Pt with blood transfusion ordered. Per admitting team pt will have this done in dialysis. Pt without complaints.

## 2017-02-08 NOTE — ED Notes (Signed)
Pt without complaints. Aware she is to have dialysis this evening and that no time is set yet.

## 2017-02-09 ENCOUNTER — Encounter (HOSPITAL_COMMUNITY)
Admission: EM | Disposition: A | Discharge status: Home / Self Care | Payer: Self-pay | Source: Home / Self Care | Attending: Emergency Medicine

## 2017-02-09 DIAGNOSIS — E782 Mixed hyperlipidemia: Secondary | ICD-10-CM

## 2017-02-09 DIAGNOSIS — Z992 Dependence on renal dialysis: Secondary | ICD-10-CM

## 2017-02-09 DIAGNOSIS — D631 Anemia in chronic kidney disease: Secondary | ICD-10-CM | POA: Diagnosis not present

## 2017-02-09 DIAGNOSIS — N186 End stage renal disease: Secondary | ICD-10-CM

## 2017-02-09 DIAGNOSIS — I160 Hypertensive urgency: Secondary | ICD-10-CM

## 2017-02-09 DIAGNOSIS — R0789 Other chest pain: Secondary | ICD-10-CM | POA: Diagnosis not present

## 2017-02-09 DIAGNOSIS — R748 Abnormal levels of other serum enzymes: Secondary | ICD-10-CM

## 2017-02-09 DIAGNOSIS — N2581 Secondary hyperparathyroidism of renal origin: Secondary | ICD-10-CM | POA: Diagnosis not present

## 2017-02-09 DIAGNOSIS — I120 Hypertensive chronic kidney disease with stage 5 chronic kidney disease or end stage renal disease: Secondary | ICD-10-CM | POA: Diagnosis not present

## 2017-02-09 DIAGNOSIS — E875 Hyperkalemia: Secondary | ICD-10-CM | POA: Diagnosis not present

## 2017-02-09 LAB — RENAL FUNCTION PANEL
Albumin: 2.9 g/dL — ABNORMAL LOW (ref 3.5–5.0)
Anion gap: 13 (ref 5–15)
BUN: 58 mg/dL — ABNORMAL HIGH (ref 6–20)
CHLORIDE: 97 mmol/L — AB (ref 101–111)
CO2: 23 mmol/L (ref 22–32)
CREATININE: 10.35 mg/dL — AB (ref 0.44–1.00)
Calcium: 7.9 mg/dL — ABNORMAL LOW (ref 8.9–10.3)
GFR calc Af Amer: 4 mL/min — ABNORMAL LOW (ref >60)
GFR calc non Af Amer: 3 mL/min — ABNORMAL LOW (ref >60)
Glucose, Bld: 86 mg/dL (ref 65–99)
POTASSIUM: 5.3 mmol/L — AB (ref 3.5–5.1)
Phosphorus: 5.8 mg/dL — ABNORMAL HIGH (ref 2.5–4.6)
Sodium: 133 mmol/L — ABNORMAL LOW (ref 135–145)

## 2017-02-09 LAB — TROPONIN I
TROPONIN I: 0.03 ng/mL — AB (ref <0.03)
TROPONIN I: 0.03 ng/mL — AB (ref <0.03)

## 2017-02-09 LAB — BASIC METABOLIC PANEL
Anion gap: 12 (ref 5–15)
BUN: 59 mg/dL — AB (ref 6–20)
CALCIUM: 8 mg/dL — AB (ref 8.9–10.3)
CO2: 23 mmol/L (ref 22–32)
CREATININE: 10.2 mg/dL — AB (ref 0.44–1.00)
Chloride: 99 mmol/L — ABNORMAL LOW (ref 101–111)
GFR calc non Af Amer: 3 mL/min — ABNORMAL LOW (ref >60)
GFR, EST AFRICAN AMERICAN: 4 mL/min — AB (ref >60)
Glucose, Bld: 79 mg/dL (ref 65–99)
Potassium: 6.1 mmol/L — ABNORMAL HIGH (ref 3.5–5.1)
SODIUM: 134 mmol/L — AB (ref 135–145)

## 2017-02-09 LAB — CBC
HCT: 26.7 % — ABNORMAL LOW (ref 36.0–46.0)
HEMATOCRIT: 25.4 % — AB (ref 36.0–46.0)
Hemoglobin: 8.7 g/dL — ABNORMAL LOW (ref 12.0–15.0)
Hemoglobin: 8.2 g/dL — ABNORMAL LOW (ref 12.0–15.0)
MCH: 31.3 pg (ref 26.0–34.0)
MCH: 30.8 pg (ref 26.0–34.0)
MCHC: 32.6 g/dL (ref 30.0–36.0)
MCHC: 32.3 g/dL (ref 30.0–36.0)
MCV: 96 fL (ref 78.0–100.0)
MCV: 95.5 fL (ref 78.0–100.0)
PLATELETS: 192 10*3/uL (ref 150–400)
Platelets: 208 10*3/uL (ref 150–400)
RBC: 2.78 MIL/uL — ABNORMAL LOW (ref 3.87–5.11)
RBC: 2.66 MIL/uL — ABNORMAL LOW (ref 3.87–5.11)
RDW: 16.2 % — AB (ref 11.5–15.5)
RDW: 16.1 % — AB (ref 11.5–15.5)
WBC: 6.2 10*3/uL (ref 4.0–10.5)
WBC: 6.5 10*3/uL (ref 4.0–10.5)

## 2017-02-09 LAB — HEPARIN LEVEL (UNFRACTIONATED)
HEPARIN UNFRACTIONATED: 0.55 [IU]/mL (ref 0.30–0.70)
Heparin Unfractionated: 0.37 IU/mL (ref 0.30–0.70)

## 2017-02-09 LAB — MRSA PCR SCREENING: MRSA BY PCR: NEGATIVE

## 2017-02-09 SURGERY — LEFT HEART CATH AND CORS/GRAFTS ANGIOGRAPHY
Anesthesia: LOCAL

## 2017-02-09 MED ORDER — METHYLPREDNISOLONE SODIUM SUCC 40 MG IJ SOLR
40.0000 mg | INTRAMUSCULAR | Status: DC
  Administered 2017-02-09 (×2): 40 mg via INTRAVENOUS
  Filled 2017-02-09 (×2): qty 1

## 2017-02-09 MED ORDER — DIPHENHYDRAMINE HCL 25 MG PO CAPS
25.0000 mg | ORAL_CAPSULE | ORAL | Status: DC
  Filled 2017-02-09: qty 1

## 2017-02-09 NOTE — Progress Notes (Signed)
[place] for Heparin Indication: chest pain/ACS  Allergies  Allergen Reactions  . Aspirin Other (See Comments)    High Doses Mess up her stomach; "makes my bowels have blood in them". Takes 81 mg EC Aspirin   . Penicillins Other (See Comments)    SYNCOPE? , "makes me real weak when I take it; like I'll pass out"  Has patient had a PCN reaction causing immediate rash, facial/tongue/throat swelling, SOB or lightheadedness with hypotension: Yes Has patient had a PCN reaction causing severe rash involving mucus membranes or skin necrosis: no Has patient had a PCN reaction that required hospitalization no Has patient had a PCN reaction occurring within the last 10 years: no If all of the above  . Amlodipine Swelling  . Bactrim [Sulfamethoxazole-Trimethoprim] Rash  . Contrast Media [Iodinated Diagnostic Agents] Itching  . Iron Itching and Other (See Comments)    "they gave me iron in dialysis; had to give me Benadryl cause I had to have the iron" (05/02/2012)  . Nitrofurantoin Hives  . Prilosec [Omeprazole] Other (See Comments)    "back spasms"  . Tylenol [Acetaminophen] Itching and Other (See Comments)    Makes her feet on fire per pt  . Gabapentin Other (See Comments)    unspecified  . Dexilant [Dexlansoprazole] Other (See Comments)    Upset stomach  . Levaquin [Levofloxacin In D5w] Rash  . Morphine And Related Itching    Itching in feet  . Plavix [Clopidogrel Bisulfate] Rash  . Protonix [Pantoprazole Sodium] Rash  . Venofer [Ferric Oxide] Itching    Patient reports using Benadryl prior to doses as [facility]    Patient Measurements: Height: 5\' 2"  (157.5 cm) Weight: 125 lb (56.7 kg) IBW/kg (Calculated) : 50.1  Vital Signs: Temp: 98.7 F (37.1 C) (08/02 1148) Temp Source: Oral (08/02 1148) BP: 181/70 (08/02 1148) Pulse Rate: 67 (08/02 1148)  Labs:  Recent Labs  02/08/17 1205 02/08/17 2220 02/09/17 0040 02/09/17 0341  02/09/17 0609 02/09/17 1019  HGB 8.0*  --   --  8.7* 8.2*  --   HCT 24.2*  --   --  26.7* 25.4*  --   PLT 195  --   --  192 208  --   APTT 32  --   --   --   --   --   LABPROT 13.3  --   --   --   --   --   INR 1.01  --   --   --   --   --   HEPARINUNFRC  --   --  0.37  --   --  0.55  CREATININE 8.95*  --   --  10.20* 10.35*  --   TROPONINI 0.03* 0.03*  --  0.03*  --  0.03*    Estimated Creatinine Clearance: 3.7 mL/min (A) (by C-G formula based on SCr of 10.35 mg/dL (H)).   Assessment: 78 YOF here with unstable angina. Patient also  has a history of Afib but not a candidate for oral anticoagulation due to history of GI bleeding. Pharmacy consulted for IV heparin. Noted plans for possible cath.  -Heparin level is at goal  Goal of Therapy:  Heparin level 0.3-0.7 units/ml (aim for lower end of target) Monitor platelets by anticoagulation protocol: Yes   Plan -No heparin changes needed -Daily heparin level and CBC  Hildred Laser, Pharm D 02/09/2017 12:05 PM

## 2017-02-09 NOTE — Progress Notes (Signed)
Subjective:  Seen on HD, never got a room assigned, not sure what plan is for heart cath.  BP variable overnight - she is without complaint Objective Vital signs in last 24 hours: Vitals:   02/09/17 0745 02/09/17 0800 02/09/17 0811 02/09/17 0827  BP: (!) 144/60 (!) 148/64 (!) 142/59 (!) 167/65  Pulse: 60 64 62 62  Resp: 18 20 18 17   Temp: 98.1 F (36.7 C)  98.1 F (36.7 C) 98.2 F (36.8 C)  TempSrc: Oral   Oral  SpO2: 99%   100%  Weight:      Height:       Weight change:   Intake/Output Summary (Last 24 hours) at 02/09/17 0839 Last data filed at 02/09/17 0300  Gross per 24 hour  Intake            73.36 ml  Output                0 ml  Net            73.36 ml    Assessment/Plan: 76 year old female with HTN , CAD and ESRD.  Presenting with CP and malignant HTN 1 CP- aggressive BP control, trend enzymes, may need cardiac cath today - per cards 2 ESRD: normally MWF via PC- high BP not unusual.  Have orders from home unit  HD early this AM off schedule- next can do Friday or  Saturday, then Monday on schedule, depending on how long she stays. Has appt for new HD access on 8/15 3 Hypertension: malignant and not unusual to be this high.  As OP , on coreg 6.25, hydralazine, clonidine and ntg.  Apparently BP labile on HD- probably because is so small- monitor and stabilize best as can 4. Anemia of ESRD: hgb pretty low, this could be contrib to CP- cont ESA here- also being transfused one unit in HD today 5. Metabolic Bone Disease: continue home doses of hectorol and fosrenol.  Also on sensipar as OP    Shawna [name] A    Labs: Basic Metabolic Panel:  Recent Labs Lab 02/08/17 1205 02/09/17 0341 02/09/17 0609  NA 131* 134* 133*  K 4.4 6.1* 5.3*  CL 96* 99* 97*  CO2 22 23 23   GLUCOSE 118* 79 86  BUN 47* 59* 58*  CREATININE 8.95* 10.20* 10.35*  CALCIUM 8.0* 8.0* 7.9*  PHOS  --   --  5.8*   Liver Function Tests:  Recent Labs Lab 02/08/17 1205 02/09/17 0609  AST  17  --   ALT 10*  --   ALKPHOS 99  --   BILITOT 0.4  --   PROT 5.3*  --   ALBUMIN 2.9* 2.9*   No results for input(s): LIPASE, AMYLASE in the last 168 hours. No results for input(s): AMMONIA in the last 168 hours. CBC:  Recent Labs Lab 02/08/17 1205 02/09/17 0341 02/09/17 0609  WBC 5.9 6.2 6.5  NEUTROABS 3.9  --   --   HGB 8.0* 8.7* 8.2*  HCT 24.2* 26.7* 25.4*  MCV 96.0 96.0 95.5  PLT 195 192 208   Cardiac Enzymes:  Recent Labs Lab 02/08/17 1205 02/08/17 2220 02/09/17 0341  TROPONINI 0.03* 0.03* 0.03*   CBG: No results for input(s): GLUCAP in the last 168 hours.  Iron Studies: No results for input(s): IRON, TIBC, TRANSFERRIN, FERRITIN in the last 72 hours. Studies/Results: Dg Chest Port 1 View  Result Date: 02/08/2017 CLINICAL DATA:  Chest pain EXAM: PORTABLE CHEST 1 VIEW COMPARISON:  January 20, 2017 FINDINGS: There is slight scarring in the left base. Lungs elsewhere clear. Heart is mildly enlarged with pulmonary vascularity within normal limits. No adenopathy. There is aortic atherosclerosis. Central catheter tip is in the superior vena cava. No pneumothorax. No bone lesions. IMPRESSION: Slight scarring left base. No edema or consolidation. Stable cardiac prominence. There is aortic atherosclerosis. Aortic Atherosclerosis (ICD10-I70.0). Electronically Signed   By: Lowella Grip III M.D.   On: 02/08/2017 12:42   Medications: Infusions: . sodium chloride    . sodium chloride    . sodium chloride    . sodium chloride    . heparin 600 Units/hr (02/08/17 1711)    Scheduled Medications: . amiodarone  200 mg Oral Daily  . aspirin  324 mg Oral Once  . aspirin EC  81 mg Oral Daily  . carvedilol  6.25 mg Oral BID WC  . cinacalcet  30 mg Oral QPC supper  . cloNIDine  0.2 mg Oral TID  . [START ON 02/10/2017] darbepoetin (ARANESP) injection - DIALYSIS  150 mcg Intravenous Q Fri-HD  . diphenhydrAMINE  25 mg Oral On Call  . [START ON 02/10/2017] doxercalciferol  3 mcg  Intravenous Q M,W,F-HD  . lanthanum  1,000 mg Oral TID WC  . methylPREDNISolone (SOLU-MEDROL) injection  40 mg Intravenous Q4H  . multivitamin  1 tablet Oral QHS  . sevelamer carbonate  800 mg Oral TID WC  . simvastatin  20 mg Oral QHS  . ticagrelor  90 mg Oral BID    have reviewed scheduled and prn medications.  Physical Exam: General: NAD Heart: RRR Lungs: mostly clear Abdomen: soft, non tender Extremities: no edema Dialysis Access: right sided PC    02/09/2017,8:39 AM  LOS: 1 day

## 2017-02-09 NOTE — Progress Notes (Signed)
ANTICOAGULATION CONSULT NOTE - Follow Up Consult  Pharmacy Consult for heparin Indication: USAP  Labs:  Recent Labs  02/08/17 1205 02/08/17 2220 02/09/17 0040  HGB 8.0*  --   --   HCT 24.2*  --   --   PLT 195  --   --   APTT 32  --   --   LABPROT 13.3  --   --   INR 1.01  --   --   HEPARINUNFRC  --   --  0.37  CREATININE 8.95*  --   --   TROPONINI 0.03* 0.03*  --     Assessment/Plan:  76yo female therapeutic on heparin with initial dosing for USAP. Will continue gtt at current rate and confirm stable with additional level.   Wynona Neat, PharmD, BCPS  02/09/2017,1:42 AM

## 2017-02-09 NOTE — Progress Notes (Signed)
IR aware of request for IV start. Patient was sent from the ED yesterday afternoon to come to IR, however staff/patient was not ready.  IV was obtained by ED staff. Spoke with floor RN this AM.   Patient has access.  Will cancel IR order.   Brynda Greathouse, MMS RD PA-C

## 2017-02-09 NOTE — Procedures (Signed)
Patient was seen on dialysis and the procedure was supervised.  BFR 400  Via PC BP is  167/65.   Patient appears to be tolerating treatment well  Emmons Toth A 02/09/2017\

## 2017-02-09 NOTE — Discharge Summary (Signed)
Discharge Summary    Patient ID: Shawna [name],  MRN: [id number], DOB/AGE: 03/11/1941 76 y.o.  Admit date: 02/08/2017 Discharge date: 02/09/2017  Primary Care Provider: [name] Primary Cardiologist: Bronson Ing  Discharge Diagnoses    Active Problems:   Chest pain   ESRD on hemodialysis [facility])   HLD (hyperlipidemia)   Hypertensive urgency  Allergies Allergies  Allergen Reactions  . Aspirin Other (See Comments)    High Doses Mess up her stomach; "makes my bowels have blood in them". Takes 81 mg EC Aspirin   . Penicillins Other (See Comments)    SYNCOPE? , "makes me real weak when I take it; like I'll pass out"  Has patient had a PCN reaction causing immediate rash, facial/tongue/throat swelling, SOB or lightheadedness with hypotension: Yes Has patient had a PCN reaction causing severe rash involving mucus membranes or skin necrosis: no Has patient had a PCN reaction that required hospitalization no Has patient had a PCN reaction occurring within the last 10 years: no If all of the above  . Amlodipine Swelling  . Bactrim [Sulfamethoxazole-Trimethoprim] Rash  . Contrast Media [Iodinated Diagnostic Agents] Itching  . Iron Itching and Other (See Comments)    "they gave me iron in dialysis; had to give me Benadryl cause I had to have the iron" (05/02/2012)  . Nitrofurantoin Hives  . Prilosec [Omeprazole] Other (See Comments)    "back spasms"  . Tylenol [Acetaminophen] Itching and Other (See Comments)    Makes her feet on fire per pt  . Gabapentin Other (See Comments)    unspecified  . Dexilant [Dexlansoprazole] Other (See Comments)    Upset stomach  . Levaquin [Levofloxacin In D5w] Rash  . Morphine And Related Itching    Itching in feet  . Plavix [Clopidogrel Bisulfate] Rash  . Protonix [Pantoprazole Sodium] Rash  . Venofer [Ferric Oxide] Itching    Patient reports using Benadryl prior to doses as [place] _____________   History of Present Illness     Shawna [name] presents from [facility] as a "code STEMI."  The patient awoke in the middle the night with discomfort in her upper back that felt like her typical anginal pain. Her pain waxed and waned through the morning. She also experienced a dull pressure in the center of her chest. All of this was similar to her cardiac pain in the past. She went to outpatient hemodialysis and EMS was called because she was complaining of chest. Her last dialysis session was Monday.  On arrival the patient was still having mild chest discomfort. She did have improvement with sublingual nitroglycerin and aspirin in route. Her EKG was unchanged and the code STEMI was canceled. She had mild shortness of breath. Denied nausea, vomiting, or diaphoresis. No lightheadedness or syncope. Stated her blood pressure was a proximally 160/80 when she left her house that morning.  She had a similar admission 01/21/2017 with hypertensive urgency. She had run out of her antihypertensive medications and she was started back on antihypertensive therapy. No cardiology consult was obtained during that admission. She had undergone most recent PCI in April 2018 with overlapping drug-eluting stents in the diagonal branch. She has had previous CABG with patent grafts to the mid LAD, intermediate, and distal RCA. LV function has been normal with most recent assessment showing an LVEF of 50-55%. The patient has had paroxysmal atrial fibrillation but she is not anticoagulated because of  history of GI bleeding secondary to AVMs. She was admitted for further work up and possible cardiac catheterization.   Hospital Course     Consultants: Nephrology   On admission Hgb was noted at 8.0, with hx of anemia, but recent need for transfusion. Nephrology was consulted to ERSD and dialysis needs. She was placed on IV nitro and heparin. Trop was very mildly elevated at 0.03>>0.03. Blood pressures  improved and she was weaned from IV nitro. Restarted on home medications. No further episodes of chest pain. Felt that her chest pain may have been in the setting of hypertension and anemia. She was able to ambulate the following day without any recurrent chest pain.   She was seen by Dr. Irish Lack and determined stable for discharge home. Follow up has been arranged in the [place] office. No medications changes were made. Instructed to follow up with her dialysis center to dialyze on Friday or Saturday.  _____________  Discharge Vitals Blood pressure 140/70, pulse 67, temperature 98.7 F (37.1 C), temperature source Oral, resp. rate (!) 22, height 5\' 2"  (1.575 m), weight 125 lb (56.7 kg), SpO2 98 %.  Filed Weights   02/09/17 0451 02/09/17 0550 02/09/17 0953  Weight: 133 lb 3.2 oz (60.4 kg) 130 lb 8.2 oz (59.2 kg) 125 lb (56.7 kg)    Labs & Radiologic Studies    CBC  Recent Labs  02/08/17 1205 02/09/17 0341 02/09/17 0609  WBC 5.9 6.2 6.5  NEUTROABS 3.9  --   --   HGB 8.0* 8.7* 8.2*  HCT 24.2* 26.7* 25.4*  MCV 96.0 96.0 95.5  PLT 195 192 782   Basic Metabolic Panel  Recent Labs  02/09/17 0341 02/09/17 0609  NA 134* 133*  K 6.1* 5.3*  CL 99* 97*  CO2 23 23  GLUCOSE 79 86  BUN 59* 58*  CREATININE 10.20* 10.35*  CALCIUM 8.0* 7.9*  PHOS  --  5.8*   Liver Function Tests  Recent Labs  02/08/17 1205 02/09/17 0609  AST 17  --   ALT 10*  --   ALKPHOS 99  --   BILITOT 0.4  --   PROT 5.3*  --   ALBUMIN 2.9* 2.9*   No results for input(s): LIPASE, AMYLASE in the last 72 hours. Cardiac Enzymes  Recent Labs  02/08/17 2220 02/09/17 0341 02/09/17 1019  TROPONINI 0.03* 0.03* 0.03*   BNP Invalid input(s): POCBNP D-Dimer No results for input(s): DDIMER in the last 72 hours. Hemoglobin A1C No results for input(s): HGBA1C in the last 72 hours. Fasting Lipid Panel  Recent Labs  02/08/17 1205  CHOL 106  HDL 52  LDLCALC 31  TRIG 113  CHOLHDL 2.0   Thyroid  Function Tests No results for input(s): TSH, T4TOTAL, T3FREE, THYROIDAB in the last 72 hours.  Invalid input(s): FREET3 _____________  Dg Chest 2 View  Result Date: 01/20/2017 CLINICAL DATA:  77 year old female with chest pain and palpitation. EXAM: CHEST  2 VIEW COMPARISON:  Chest radiograph dated 10/12/2016 FINDINGS: Right-sided dialysis catheter with tip over the right atrium in stable positioning. The cardiac silhouette is within normal limits. Median sternotomy wires and postsurgical changes of CABG noted. There minimal bibasilar linear atelectasis/ scarring. No focal consolidation, pleural effusion, or pneumothorax. Overall improvement of the aeration of the lungs compared to prior radiograph. There is atherosclerotic calcification of the abdominal aorta and splenic artery. No acute osseous pathology. IMPRESSION: No active cardiopulmonary disease. Electronically Signed   By: Anner Crete M.D.   On:  01/20/2017 03:32   Dg Chest Port 1 View  Result Date: 02/08/2017 CLINICAL DATA:  Chest pain EXAM: PORTABLE CHEST 1 VIEW COMPARISON:  January 20, 2017 FINDINGS: There is slight scarring in the left base. Lungs elsewhere clear. Heart is mildly enlarged with pulmonary vascularity within normal limits. No adenopathy. There is aortic atherosclerosis. Central catheter tip is in the superior vena cava. No pneumothorax. No bone lesions. IMPRESSION: Slight scarring left base. No edema or consolidation. Stable cardiac prominence. There is aortic atherosclerosis. Aortic Atherosclerosis (ICD10-I70.0). Electronically Signed   By: Lowella Grip III M.D.   On: 02/08/2017 12:42   Disposition   Pt is being discharged home today in good condition.  Follow-up Plans & Appointments    Follow-up Information    Herminio Commons, MD Follow up.   Specialty:  Cardiology Why:  Office will call you with a follow up appt.  Contact information: [place] [address] [phone]           Discharge Instructions    Diet - low sodium heart healthy    Complete by:  As directed    Discharge instructions    Complete by:  As directed    Please continue your home medications as prescibed, we have not made any changes this admission. Need to contact your dialysis center about being seen either Friday or Saturday, then plan to resume usual routine. Please call the office with any questions. Will arrange follow up with the [place] office for you.   Increase activity slowly    Complete by:  As directed       Discharge Medications   Current Discharge Medication List    CONTINUE these medications which have NOT CHANGED   Details  ALPRAZolam (XANAX) 0.25 MG tablet Take 0.25 mg by mouth 2 (two) times daily as needed for anxiety or sleep.     amiodarone (PACERONE) 200 MG tablet Take 1 tablet (200 mg total) by mouth daily. Qty: 30 tablet, Refills: 3    aspirin EC 81 MG tablet Take 81 mg by mouth daily.     carvedilol (COREG) 6.25 MG tablet Take 1 tablet (6.25 mg total) by mouth 2 (two) times daily with a meal. Qty: 180 tablet, Refills: 1    cinacalcet (SENSIPAR) 30 MG tablet Take 30 mg by mouth daily after supper.    cloNIDine (CATAPRES) 0.2 MG tablet Take 1 tablet (0.2 mg total) by mouth 3 (three) times daily. Qty: 90 tablet, Refills: 3    diphenhydrAMINE (BENADRYL) 25 mg capsule Take 25 mg by mouth at bedtime.    epoetin alfa (EPOGEN,PROCRIT) 28315 UNIT/ML injection Inject 1 mL (10,000 Units total) into the vein every Monday, Wednesday, and Friday with hemodialysis. Qty: 1 mL, Refills: 10    fluticasone (FLONASE) 50 MCG/ACT nasal spray Place 2 sprays into the nose at bedtime as needed for allergies.     hydrALAZINE (APRESOLINE) 50 MG tablet Take 2 tablets (100 mg total) by mouth 3 (three) times daily. Qty: 540 tablet, Refills: 3    isosorbide mononitrate (IMDUR) 120 MG 24 hr tablet Take 1 tablet (120 mg total) by mouth daily. Qty: 30 tablet, Refills: 2     loratadine (CLARITIN) 10 MG tablet Take 10 mg by mouth daily.     multivitamin (RENA-VIT) TABS tablet Take 1 tablet by mouth daily.    nitroGLYCERIN (NITROSTAT) 0.4 MG SL tablet Place 1 tablet (0.4 mg total) under the tongue every 5 (five) minutes as needed for chest  pain. Qty: 30 tablet, Refills: 1    omeprazole (PRILOSEC) 20 MG capsule Take 20 mg by mouth daily.     ondansetron (ZOFRAN-ODT) 4 MG disintegrating tablet Take 4 mg by mouth 2 (two) times daily as needed for nausea or vomiting.    sevelamer carbonate (RENVELA) 800 MG tablet Take 800 mg by mouth 3 (three) times daily with meals.    simvastatin (ZOCOR) 20 MG tablet Take 20 mg by mouth at bedtime.    ticagrelor (BRILINTA) 90 MG TABS tablet Take 90 mg by mouth 2 (two) times daily.         Outstanding Labs/Studies   N/a  Duration of Discharge Encounter   Greater than 30 minutes including physician time.  Signed, Reino Bellis NP-C 02/09/2017, 3:24 PM   I have examined the patient and reviewed assessment and plan and discussed with patient.  Agree with above as stated.  Patient feeling better after dialysis.  BP better, but still high at times.  No further ches tpain and flat troponin trend.  Stressed importance of BP control.  I don't think she had true ACS but rather hypertensive urgency.  Prior ACE showed significant troponin increase.  F/u with Dr. Bronson Ing.  SHe assures [place] that BP usually controlled when she takes her meds and checks BP at home  WOuld consider increasing hydralazine as an outpatient if readings high in the office.  BP down to 140/70 after receiving her meds today.  Larae Grooms

## 2017-02-09 NOTE — Progress Notes (Signed)
Patient received discharge instructions. No change in medication. Education on heart failure, chest pain, and renal diet. Patient and husband verbalized understanding, and had no further questions. PIV removed. Wheeled out via wheelchair by Chartered certified accountant.

## 2017-02-09 NOTE — Care Management CC44 (Signed)
Condition Code 44 Documentation Completed  Patient Details  Name: Shawna [name] MRN: [id number] Date of Birth: 03/01/1941   Condition Code 44 given:  Yes Patient signature on Condition Code 44 notice:  Yes Documentation of 2 MD's agreement:  Yes Code 44 added to claim:  Yes    Bethena Roys, RN 02/09/2017, 2:55 PM

## 2017-02-09 NOTE — Care Management Obs Status (Signed)
[place] NOTIFICATION   Patient Details  Name: HEAVYN YEARSLEY MRN: [id number] Date of Birth: 22-Aug-1940   Medicare Observation Status Notification Given:  Yes    Bethena Roys, RN 02/09/2017, 2:55 PM

## 2017-02-10 ENCOUNTER — Ambulatory Visit: Payer: Medicare HMO | Admitting: Cardiovascular Disease

## 2017-02-10 LAB — BPAM RBC
Blood Product Expiration Date: 201808292359
Blood Product Expiration Date: 201808292359
ISSUE DATE / TIME: 201808020756
ISSUE DATE / TIME: 201808020756
Unit Type and Rh: 5100
Unit Type and Rh: 5100

## 2017-02-10 LAB — TYPE AND SCREEN
ABO/RH(D): O POS
Antibody Screen: NEGATIVE
DONOR AG TYPE: NEGATIVE
DONOR AG TYPE: NEGATIVE
Unit division: 0
Unit division: 0

## 2017-02-11 DIAGNOSIS — Z992 Dependence on renal dialysis: Secondary | ICD-10-CM | POA: Diagnosis not present

## 2017-02-11 DIAGNOSIS — N186 End stage renal disease: Secondary | ICD-10-CM | POA: Diagnosis not present

## 2017-02-13 ENCOUNTER — Other Ambulatory Visit: Payer: Self-pay | Admitting: Adult Health

## 2017-02-18 DIAGNOSIS — E8770 Fluid overload, unspecified: Secondary | ICD-10-CM | POA: Diagnosis not present

## 2017-02-18 DIAGNOSIS — N186 End stage renal disease: Secondary | ICD-10-CM | POA: Diagnosis not present

## 2017-02-18 DIAGNOSIS — Z992 Dependence on renal dialysis: Secondary | ICD-10-CM | POA: Diagnosis not present

## 2017-02-20 ENCOUNTER — Telehealth: Payer: Self-pay | Admitting: Cardiovascular Disease

## 2017-02-20 NOTE — Telephone Encounter (Signed)
Shawna [name] called stating that she is scheduled for a procedure on Wednesday at [facility]. She was told to come off of the ticagrelor (BRILINTA) 90 MG TABS tablet  .She states that [facility] was to contact our office.

## 2017-02-21 ENCOUNTER — Encounter (INDEPENDENT_AMBULATORY_CARE_PROVIDER_SITE_OTHER): Payer: Self-pay | Admitting: Internal Medicine

## 2017-02-21 ENCOUNTER — Ambulatory Visit (INDEPENDENT_AMBULATORY_CARE_PROVIDER_SITE_OTHER): Payer: Medicare HMO | Admitting: Internal Medicine

## 2017-02-21 ENCOUNTER — Ambulatory Visit: Payer: Medicare HMO | Admitting: Urology

## 2017-02-21 VITALS — BP 110/70 | HR 60 | Temp 98.3°F | Resp 18 | Ht 62.0 in | Wt 128.4 lb

## 2017-02-21 DIAGNOSIS — D132 Benign neoplasm of duodenum: Secondary | ICD-10-CM

## 2017-02-21 DIAGNOSIS — D649 Anemia, unspecified: Secondary | ICD-10-CM

## 2017-02-21 MED ORDER — CLINDAMYCIN PHOSPHATE 300 MG/50ML IV SOLN: 300.0000 mg | Freq: Once | INTRAVENOUS | Status: DC

## 2017-02-21 NOTE — Telephone Encounter (Addendum)
Preoperative clearance for AV fistula revision. This patient has a long history of CAD with 2 NSTEMI's in 09/2016 and 10/2016 treated with DES to the diagonal for in-stent restenosis. Extensive CAD as listed above. Patient is on Brilinta. I discussed this patient in detail with Dr. Bronson Ing who knows her well. He said she can proceed with AV fistula revision as long as she stays on her Brilinta without any interruption given her recent MI and DES. F/U with Dr. Bronson Ing. ------------------------------------------------------------------------ Call placed to Dr. Delana Meyer office - spoke with Otila Kluver ([facility]).  Informed her of message above per OV on 01/25/2017.  She stated that for her she can remain on the Brilinta and proceed tomorrow for procedure as planned.    Patient notified & verbalized understanding.

## 2017-02-21 NOTE — Progress Notes (Signed)
Presenting complaint;  Follow-up for anemia.  Database and Subjective:  Patient is 76 year old African-American female who is here for scheduled visit.  She has history of GI bleed and anemia dating back to about 4 years ago. She was hospitalized at [facility] in January this year for non-STEMI. She also was anemic and stool was guaiac positive. She underwent EGD by Dr. Havery Moros. She was found to have small polyp in distal duodenum and single nonbleeding AV malformation at proximal jejunum and was ablated. She received blood transfusion. She underwent coronary stenting with DES and was begun on anticoagulant over 4 weeks ago. She was hospitalized again last week with chest pain and noted to have a low hemoglobin. She received one unit of PRBCs. Her troponin level was mildly elevated and was felt to be due to demand ischemia. She recalls her stool was guaiac negative. She denies melena or rectal bleeding. She has infrequent heartburn. She is having 2-3 bowel movements per day. Most of her stools are formed or soft. She says appetite was not good but is back to normal. She says she will have her H&H at the time of dialysis later today. She has not lost weight since her last visit 6 months ago. She wants to know when duodenal polyp would be removed.   Current Medications: Outpatient Encounter Prescriptions as of 02/21/2017  Medication Sig  . ALPRAZolam (XANAX) 0.25 MG tablet Take 0.25 mg by mouth 2 (two) times daily as needed for anxiety or sleep.   Marland Kitchen amiodarone (PACERONE) 200 MG tablet Take 1 tablet (200 mg total) by mouth daily.  Marland Kitchen aspirin EC 81 MG tablet Take 81 mg by mouth daily.   . carvedilol (COREG) 6.25 MG tablet Take 1 tablet (6.25 mg total) by mouth 2 (two) times daily with a meal.  . cinacalcet (SENSIPAR) 30 MG tablet Take 30 mg by mouth daily after supper.  . cloNIDine (CATAPRES) 0.2 MG tablet Take 1 tablet (0.2 mg total) by mouth 3 (three) times daily. (Patient taking differently: Take 0.2  mg by mouth 2 (two) times daily as needed (high blood pressure). )  . diphenhydrAMINE (BENADRYL) 25 mg capsule Take 50 mg by mouth daily as needed for itching or allergies.   Marland Kitchen epoetin alfa (EPOGEN,PROCRIT) 36644 UNIT/ML injection Inject 1 mL (10,000 Units total) into the vein every Monday, Wednesday, and Friday with hemodialysis.  . fluticasone (FLONASE) 50 MCG/ACT nasal spray Place 2 sprays into the nose at bedtime as needed for allergies.   . isosorbide mononitrate (IMDUR) 120 MG 24 hr tablet Take 1 tablet (120 mg total) by mouth daily.  Marland Kitchen loratadine (CLARITIN) 10 MG tablet Take 10 mg by mouth daily as needed for allergies.   . multivitamin (RENA-VIT) TABS tablet Take 1 tablet by mouth daily.  . nitroGLYCERIN (NITROSTAT) 0.4 MG SL tablet Place 1 tablet (0.4 mg total) under the tongue every 5 (five) minutes as needed for chest pain.  Marland Kitchen omeprazole (PRILOSEC) 20 MG capsule Take 20 mg by mouth daily.   . ondansetron (ZOFRAN-ODT) 4 MG disintegrating tablet Take 4 mg by mouth 2 (two) times daily as needed for nausea or vomiting.  . sevelamer carbonate (RENVELA) 800 MG tablet Take 800 mg by mouth 3 (three) times daily with meals.  . simvastatin (ZOCOR) 20 MG tablet TAKE ONE TABLET BY MOUTH AT BEDTIME  . ticagrelor (BRILINTA) 90 MG TABS tablet Take 90 mg by mouth 2 (two) times daily.  . hydrALAZINE (APRESOLINE) 50 MG tablet Take 2 tablets (100  mg total) by mouth 3 (three) times daily. (Patient taking differently: Take 100 mg by mouth 2 (two) times daily as needed (for high blood pressure readings). )   No facility-administered encounter medications on file as of 02/21/2017.      Objective: Blood pressure 110/70, pulse 60, temperature 98.3 F (36.8 C), temperature source Oral, resp. rate 18, height 5\' 2"  (1.575 m), weight 128 lb 6.4 oz (58.2 kg). Patient is alert and in no acute distress. Conjunctiva is pink. Sclera is nonicteric Oropharyngeal mucosa is normal. No neck masses or thyromegaly  noted. She has right subclavian catheter in place. Cardiac exam with regular rhythm normal S1 and S2. Grade 2/6 systolic murmur noted at left sternal border. Lungs are clear to auscultation. Abdomen is soft and nontender without organomegaly or masses.  No LE edema or clubbing noted. She has AV fistula at left arm and it feels somewhat firm.  Labs/studies Results: H&H on 02/09/2017 was 8.2 and 25.4.  Posttransfusion H&H not available.   Assessment:  #1. History of GI bleed. She has been documented to be bleeding from small bowel angiodysplasia. She had one ablated with APC in January this year. No active evidence of overt GI bleed. Need for further workup will depend on her clinical course.  #2. Anemia. Anemia is multifactorial and primarily due to chronic liver disease. She is now on a protein injection 3 times a week. H&H will be monitored via hemodialysis center.  #3. Duodenal polyp. Review of endoscopic pictures reveal small polyp which appears to be an adenoma. This will be removed at a later date when anticoagulant can be interrupted for few days.  #4. Chronic GERD. Symptoms are well controlled with PPI.   Plan:  Hemoccult 1. She will do Hemoccults again if she has dark or black stools. She will have H&H at the time of hemodialysis and asked them to send report to our office. OV in 3 months.Marland Kitchen

## 2017-02-21 NOTE — Patient Instructions (Signed)
Hemoccult 1. Repeat Hemoccult if stools dark or black. Please have a dialysis clinic send [place] a copy of your hemoglobin every time it is done.

## 2017-02-22 ENCOUNTER — Encounter
Admission: RE | Disposition: A | Discharge status: Home / Self Care | Payer: Self-pay | Source: Ambulatory Visit | Attending: Vascular Surgery

## 2017-02-22 ENCOUNTER — Encounter: Payer: Self-pay | Admitting: Anesthesiology

## 2017-02-22 ENCOUNTER — Ambulatory Visit
Admission: RE | Admit: 2017-02-22 | Discharge: 2017-02-22 | Disposition: A | Discharge status: Home / Self Care | Payer: Medicare HMO | Source: Ambulatory Visit | Attending: Vascular Surgery | Admitting: Vascular Surgery

## 2017-02-22 ENCOUNTER — Encounter: Payer: Self-pay | Admitting: *Deleted

## 2017-02-22 DIAGNOSIS — N186 End stage renal disease: Secondary | ICD-10-CM | POA: Diagnosis not present

## 2017-02-22 DIAGNOSIS — Z539 Procedure and treatment not carried out, unspecified reason: Secondary | ICD-10-CM | POA: Diagnosis not present

## 2017-02-22 LAB — POCT I-STAT 4, (NA,K, GLUC, HGB,HCT)
Glucose, Bld: 95 mg/dL (ref 65–99)
HEMATOCRIT: 40 % (ref 36.0–46.0)
HEMOGLOBIN: 13.6 g/dL (ref 12.0–15.0)
POTASSIUM: 4 mmol/L (ref 3.5–5.1)
SODIUM: 141 mmol/L (ref 135–145)

## 2017-02-22 SURGERY — REVISON OF ARTERIOVENOUS FISTULA
Anesthesia: General | Laterality: Left

## 2017-02-22 MED ORDER — SODIUM CHLORIDE 0.9 % IV SOLN: INTRAVENOUS | Status: DC

## 2017-02-22 SURGICAL SUPPLY — 55 items
APPLIER CLIP 11 MED OPEN (CLIP)
APPLIER CLIP 9.375 SM OPEN (CLIP)
BAG COUNTER SPONGE EZ (MISCELLANEOUS) ×2 IMPLANT
BAG DECANTER FOR FLEXI CONT (MISCELLANEOUS) ×2 IMPLANT
BLADE SURG SZ11 CARB STEEL (BLADE) ×2 IMPLANT
BOOT SUTURE AID YELLOW STND (SUTURE) ×2 IMPLANT
BRUSH SCRUB 4% CHG (MISCELLANEOUS) ×2 IMPLANT
CANISTER SUCT 1200ML W/VALVE (MISCELLANEOUS) ×2 IMPLANT
CHLORAPREP W/TINT 26ML (MISCELLANEOUS) ×2 IMPLANT
CLIP APPLIE 11 MED OPEN (CLIP) IMPLANT
CLIP APPLIE 9.375 SM OPEN (CLIP) IMPLANT
DERMABOND ADVANCED (GAUZE/BANDAGES/DRESSINGS) ×1
DERMABOND ADVANCED .7 DNX12 (GAUZE/BANDAGES/DRESSINGS) ×1 IMPLANT
DRESSING SURGICEL FIBRLLR 1X2 (HEMOSTASIS) ×1 IMPLANT
DRSG SURGICEL FIBRILLAR 1X2 (HEMOSTASIS) ×2
ELECT CAUTERY BLADE 6.4 (BLADE) ×2 IMPLANT
ELECT REM PT RETURN 9FT ADLT (ELECTROSURGICAL) ×2
ELECTRODE REM PT RTRN 9FT ADLT (ELECTROSURGICAL) ×1 IMPLANT
GLOVE BIO SURGEON STRL SZ7 (GLOVE) ×2 IMPLANT
GLOVE INDICATOR 7.5 STRL GRN (GLOVE) ×2 IMPLANT
GLOVE SURG SYN 8.0 (GLOVE) ×2 IMPLANT
GOWN STRL REUS W/ TWL LRG LVL3 (GOWN DISPOSABLE) ×2 IMPLANT
GOWN STRL REUS W/ TWL XL LVL3 (GOWN DISPOSABLE) ×1 IMPLANT
GOWN STRL REUS W/TWL LRG LVL3 (GOWN DISPOSABLE) ×2
GOWN STRL REUS W/TWL XL LVL3 (GOWN DISPOSABLE) ×1
IV NS 500ML (IV SOLUTION) ×1
IV NS 500ML BAXH (IV SOLUTION) ×1 IMPLANT
KIT RM TURNOVER STRD PROC AR (KITS) ×2 IMPLANT
LABEL OR SOLS (LABEL) ×2 IMPLANT
LOOP RED MAXI  1X406MM (MISCELLANEOUS) ×1
LOOP VESSEL MAXI 1X406 RED (MISCELLANEOUS) ×1 IMPLANT
LOOP VESSEL MINI 0.8X406 BLUE (MISCELLANEOUS) ×2 IMPLANT
LOOPS BLUE MINI 0.8X406MM (MISCELLANEOUS) ×2
NEEDLE FILTER BLUNT 18X 1/2SAF (NEEDLE) ×1
NEEDLE FILTER BLUNT 18X1 1/2 (NEEDLE) ×1 IMPLANT
NS IRRIG 500ML POUR BTL (IV SOLUTION) ×2 IMPLANT
PACK EXTREMITY ARMC (MISCELLANEOUS) ×2 IMPLANT
PAD PREP 24X41 OB/GYN DISP (PERSONAL CARE ITEMS) ×2 IMPLANT
PUNCH SURGICAL ROTATE 2.7MM (MISCELLANEOUS) IMPLANT
STOCKINETTE STRL 4IN 9604848 (GAUZE/BANDAGES/DRESSINGS) ×2 IMPLANT
SUT GTX CV-6 30 (SUTURE) ×4 IMPLANT
SUT MNCRL+ 5-0 UNDYED PC-3 (SUTURE) ×1 IMPLANT
SUT MONOCRYL 5-0 (SUTURE) ×1
SUT PROLENE 6 0 BV (SUTURE) ×4 IMPLANT
SUT SILK 2 0 (SUTURE) ×1
SUT SILK 2 0 SH (SUTURE) ×2 IMPLANT
SUT SILK 2-0 18XBRD TIE 12 (SUTURE) ×1 IMPLANT
SUT SILK 3 0 (SUTURE) ×1
SUT SILK 3-0 18XBRD TIE 12 (SUTURE) ×1 IMPLANT
SUT SILK 4 0 (SUTURE) ×1
SUT SILK 4-0 18XBRD TIE 12 (SUTURE) ×1 IMPLANT
SUT VIC AB 3-0 SH 27 (SUTURE) ×2
SUT VIC AB 3-0 SH 27X BRD (SUTURE) ×2 IMPLANT
SYR 20CC LL (SYRINGE) ×2 IMPLANT
SYR 3ML LL SCALE MARK (SYRINGE) ×2 IMPLANT

## 2017-02-22 NOTE — Anesthesia Preprocedure Evaluation (Deleted)
Anesthesia Evaluation  Patient identified by MRN, date of birth, ID band Patient awake    Reviewed: Allergy & Precautions, NPO status , Patient's Chart, lab work & pertinent test results  Airway        Dental   Pulmonary           Cardiovascular hypertension, Pt. on medications and Pt. on home beta blockers + CAD, + Past MI, + Cardiac Stents and +CHF  III     Neuro/Psych Anxiety TIA   GI/Hepatic Neg liver ROS, PUD, GERD  Medicated,  Endo/Other    Renal/GU ESRF and DialysisRenal disease     Musculoskeletal   Abdominal   Peds  Hematology  (+) anemia ,   Anesthesia Other Findings   Reproductive/Obstetrics                             Anesthesia Physical Anesthesia Plan Anesthesia Quick Evaluation

## 2017-02-22 NOTE — H&P (Signed)
[facility] History & Physical Update  The patient was interviewed and re-examined.  The patient's previous History and Physical has been reviewed and is unchanged.  There is no change in the plan of care. We plan to proceed with the scheduled procedure.  Hortencia Pilar, MD  02/22/2017, 7:20 AM

## 2017-02-24 ENCOUNTER — Ambulatory Visit: Payer: Medicare HMO | Admitting: Anesthesiology

## 2017-02-24 ENCOUNTER — Encounter: Payer: Self-pay | Admitting: *Deleted

## 2017-02-24 ENCOUNTER — Encounter
Admission: RE | Disposition: A | Discharge status: Home / Self Care | Payer: Self-pay | Source: Ambulatory Visit | Attending: Vascular Surgery

## 2017-02-24 ENCOUNTER — Ambulatory Visit
Admission: RE | Admit: 2017-02-24 | Discharge: 2017-02-24 | Disposition: A | Discharge status: Home / Self Care | Payer: Medicare HMO | Source: Ambulatory Visit | Attending: Vascular Surgery | Admitting: Vascular Surgery

## 2017-02-24 DIAGNOSIS — Z882 Allergy status to sulfonamides status: Secondary | ICD-10-CM | POA: Insufficient documentation

## 2017-02-24 DIAGNOSIS — E7800 Pure hypercholesterolemia, unspecified: Secondary | ICD-10-CM | POA: Insufficient documentation

## 2017-02-24 DIAGNOSIS — Z880 Allergy status to penicillin: Secondary | ICD-10-CM | POA: Insufficient documentation

## 2017-02-24 DIAGNOSIS — Z8673 Personal history of transient ischemic attack (TIA), and cerebral infarction without residual deficits: Secondary | ICD-10-CM | POA: Diagnosis not present

## 2017-02-24 DIAGNOSIS — I132 Hypertensive heart and chronic kidney disease with heart failure and with stage 5 chronic kidney disease, or end stage renal disease: Secondary | ICD-10-CM | POA: Diagnosis not present

## 2017-02-24 DIAGNOSIS — F419 Anxiety disorder, unspecified: Secondary | ICD-10-CM | POA: Diagnosis not present

## 2017-02-24 DIAGNOSIS — I5032 Chronic diastolic (congestive) heart failure: Secondary | ICD-10-CM | POA: Insufficient documentation

## 2017-02-24 DIAGNOSIS — I6522 Occlusion and stenosis of left carotid artery: Secondary | ICD-10-CM | POA: Diagnosis not present

## 2017-02-24 DIAGNOSIS — T82398A Other mechanical complication of other vascular grafts, initial encounter: Secondary | ICD-10-CM | POA: Diagnosis not present

## 2017-02-24 DIAGNOSIS — T82858A Stenosis of vascular prosthetic devices, implants and grafts, initial encounter: Secondary | ICD-10-CM | POA: Insufficient documentation

## 2017-02-24 DIAGNOSIS — I2510 Atherosclerotic heart disease of native coronary artery without angina pectoris: Secondary | ICD-10-CM | POA: Insufficient documentation

## 2017-02-24 DIAGNOSIS — Y832 Surgical operation with anastomosis, bypass or graft as the cause of abnormal reaction of the patient, or of later complication, without mention of misadventure at the time of the procedure: Secondary | ICD-10-CM | POA: Insufficient documentation

## 2017-02-24 DIAGNOSIS — T829XXA Unspecified complication of cardiac and vascular prosthetic device, implant and graft, initial encounter: Secondary | ICD-10-CM | POA: Diagnosis not present

## 2017-02-24 DIAGNOSIS — K219 Gastro-esophageal reflux disease without esophagitis: Secondary | ICD-10-CM | POA: Diagnosis not present

## 2017-02-24 DIAGNOSIS — Z8711 Personal history of peptic ulcer disease: Secondary | ICD-10-CM | POA: Diagnosis not present

## 2017-02-24 DIAGNOSIS — J42 Unspecified chronic bronchitis: Secondary | ICD-10-CM | POA: Insufficient documentation

## 2017-02-24 DIAGNOSIS — Z885 Allergy status to narcotic agent status: Secondary | ICD-10-CM | POA: Diagnosis not present

## 2017-02-24 DIAGNOSIS — M1990 Unspecified osteoarthritis, unspecified site: Secondary | ICD-10-CM | POA: Diagnosis not present

## 2017-02-24 DIAGNOSIS — Z955 Presence of coronary angioplasty implant and graft: Secondary | ICD-10-CM | POA: Diagnosis not present

## 2017-02-24 DIAGNOSIS — E669 Obesity, unspecified: Secondary | ICD-10-CM | POA: Diagnosis not present

## 2017-02-24 DIAGNOSIS — I252 Old myocardial infarction: Secondary | ICD-10-CM | POA: Insufficient documentation

## 2017-02-24 DIAGNOSIS — I120 Hypertensive chronic kidney disease with stage 5 chronic kidney disease or end stage renal disease: Secondary | ICD-10-CM | POA: Diagnosis not present

## 2017-02-24 DIAGNOSIS — I340 Nonrheumatic mitral (valve) insufficiency: Secondary | ICD-10-CM | POA: Insufficient documentation

## 2017-02-24 DIAGNOSIS — N186 End stage renal disease: Secondary | ICD-10-CM | POA: Diagnosis not present

## 2017-02-24 DIAGNOSIS — Z951 Presence of aortocoronary bypass graft: Secondary | ICD-10-CM | POA: Diagnosis not present

## 2017-02-24 DIAGNOSIS — T8249XA Other complication of vascular dialysis catheter, initial encounter: Secondary | ICD-10-CM | POA: Diagnosis not present

## 2017-02-24 DIAGNOSIS — M109 Gout, unspecified: Secondary | ICD-10-CM | POA: Diagnosis not present

## 2017-02-24 DIAGNOSIS — I10 Essential (primary) hypertension: Secondary | ICD-10-CM | POA: Diagnosis not present

## 2017-02-24 HISTORY — PX: REVISION OF ARTERIOVENOUS GORETEX GRAFT: SHX6073

## 2017-02-24 HISTORY — PX: AV FISTULA PLACEMENT: SHX1204

## 2017-02-24 LAB — BPAM RBC
Blood Product Expiration Date: 201808222359
Blood Product Expiration Date: 201808222359
Unit Type and Rh: 5100
Unit Type and Rh: 5100

## 2017-02-24 LAB — TYPE AND SCREEN
ABO/RH(D): O POS
ANTIBODY SCREEN: NEGATIVE
Unit division: 0
Unit division: 0

## 2017-02-24 LAB — POCT I-STAT 4, (NA,K, GLUC, HGB,HCT)
Glucose, Bld: 91 mg/dL (ref 65–99)
HEMATOCRIT: 41 % (ref 36.0–46.0)
HEMOGLOBIN: 13.9 g/dL (ref 12.0–15.0)
POTASSIUM: 5.4 mmol/L — AB (ref 3.5–5.1)
SODIUM: 136 mmol/L (ref 135–145)

## 2017-02-24 SURGERY — REVISION OF ARTERIOVENOUS GORETEX GRAFT
Anesthesia: General

## 2017-02-24 MED ORDER — GLYCOPYRROLATE 0.2 MG/ML IJ SOLN
INTRAMUSCULAR | Status: DC | PRN
  Administered 2017-02-24: 0.2 mg via INTRAVENOUS

## 2017-02-24 MED ORDER — CLINDAMYCIN PHOSPHATE 300 MG/50ML IV SOLN
300.0000 mg | Freq: Once | INTRAVENOUS | Status: AC
Start: 2017-02-24 — End: 2017-02-24
  Administered 2017-02-24: 300 mg via INTRAVENOUS

## 2017-02-24 MED ORDER — FENTANYL CITRATE (PF) 100 MCG/2ML IJ SOLN: 25.0000 ug | INTRAMUSCULAR | Status: DC | PRN

## 2017-02-24 MED ORDER — ONDANSETRON HCL 4 MG/2ML IJ SOLN
INTRAMUSCULAR | Status: AC
  Filled 2017-02-24: qty 2

## 2017-02-24 MED ORDER — SODIUM CHLORIDE 0.9 % IJ SOLN
INTRAMUSCULAR | Status: AC
  Filled 2017-02-24: qty 10

## 2017-02-24 MED ORDER — EPHEDRINE SULFATE 50 MG/ML IJ SOLN
INTRAMUSCULAR | Status: AC
  Filled 2017-02-24: qty 1

## 2017-02-24 MED ORDER — LIDOCAINE HCL (PF) 2 % IJ SOLN
INTRAMUSCULAR | Status: DC | PRN
  Administered 2017-02-24: 50 mg

## 2017-02-24 MED ORDER — EPHEDRINE SULFATE 50 MG/ML IJ SOLN
INTRAMUSCULAR | Status: DC | PRN
  Administered 2017-02-24 (×5): 10 mg via INTRAVENOUS

## 2017-02-24 MED ORDER — PROPOFOL 10 MG/ML IV BOLUS
INTRAVENOUS | Status: DC | PRN
  Administered 2017-02-24: 100 mg via INTRAVENOUS

## 2017-02-24 MED ORDER — PHENYLEPHRINE HCL 10 MG/ML IJ SOLN
INTRAMUSCULAR | Status: DC | PRN
  Administered 2017-02-24 (×4): 100 ug via INTRAVENOUS

## 2017-02-24 MED ORDER — SODIUM CHLORIDE 0.9 % IV SOLN
INTRAVENOUS | Status: DC | PRN
  Administered 2017-02-24: 200 mL via INTRAMUSCULAR

## 2017-02-24 MED ORDER — OXYCODONE HCL 5 MG PO TABS
ORAL_TABLET | ORAL | Status: AC
  Filled 2017-02-24: qty 1

## 2017-02-24 MED ORDER — FENTANYL CITRATE (PF) 100 MCG/2ML IJ SOLN
INTRAMUSCULAR | Status: DC | PRN
  Administered 2017-02-24 (×2): 25 ug via INTRAVENOUS
  Administered 2017-02-24: 50 ug via INTRAVENOUS

## 2017-02-24 MED ORDER — DEXAMETHASONE SODIUM PHOSPHATE 10 MG/ML IJ SOLN
INTRAMUSCULAR | Status: DC | PRN
  Administered 2017-02-24: 5 mg via INTRAVENOUS

## 2017-02-24 MED ORDER — CLINDAMYCIN PHOSPHATE 300 MG/50ML IV SOLN
INTRAVENOUS | Status: AC
  Filled 2017-02-24: qty 50

## 2017-02-24 MED ORDER — MEPERIDINE HCL 50 MG/ML IJ SOLN: 6.2500 mg | INTRAMUSCULAR | Status: DC | PRN

## 2017-02-24 MED ORDER — OXYCODONE HCL 5 MG PO TABS
5.0000 mg | ORAL_TABLET | Freq: Once | ORAL | Status: AC | PRN
  Administered 2017-02-24: 5 mg via ORAL

## 2017-02-24 MED ORDER — SODIUM CHLORIDE 0.9 % IV SOLN
INTRAVENOUS | Status: DC
  Administered 2017-02-24: 12:00:00 via INTRAVENOUS

## 2017-02-24 MED ORDER — OXYCODONE HCL 5 MG/5ML PO SOLN: 5.0000 mg | Freq: Once | ORAL | Status: AC | PRN

## 2017-02-24 MED ORDER — ONDANSETRON HCL 4 MG/2ML IJ SOLN
INTRAMUSCULAR | Status: DC | PRN
  Administered 2017-02-24: 4 mg via INTRAVENOUS

## 2017-02-24 MED ORDER — FENTANYL CITRATE (PF) 100 MCG/2ML IJ SOLN
INTRAMUSCULAR | Status: AC
  Filled 2017-02-24: qty 2

## 2017-02-24 MED ORDER — PROMETHAZINE HCL 25 MG/ML IJ SOLN: 6.2500 mg | INTRAMUSCULAR | Status: DC | PRN

## 2017-02-24 MED ORDER — OXYCODONE HCL 5 MG PO CAPS: 5.0000 mg | ORAL_CAPSULE | Freq: Four times a day (QID) | ORAL | 0 refills | Status: DC | PRN

## 2017-02-24 MED ORDER — GLYCOPYRROLATE 0.2 MG/ML IJ SOLN
INTRAMUSCULAR | Status: AC
  Filled 2017-02-24: qty 1

## 2017-02-24 SURGICAL SUPPLY — 63 items
APPLIER CLIP 11 MED OPEN (CLIP)
APPLIER CLIP 9.375 SM OPEN (CLIP)
BAG COUNTER SPONGE EZ (MISCELLANEOUS) ×2 IMPLANT
BAG DECANTER FOR FLEXI CONT (MISCELLANEOUS) ×2 IMPLANT
BLADE SURG 15 STRL LF DISP TIS (BLADE) ×1 IMPLANT
BLADE SURG 15 STRL SS (BLADE) ×1
BLADE SURG SZ11 CARB STEEL (BLADE) ×2 IMPLANT
BOOT SUTURE AID YELLOW STND (SUTURE) ×2 IMPLANT
BRUSH SCRUB EZ  4% CHG (MISCELLANEOUS) ×1
BRUSH SCRUB EZ 4% CHG (MISCELLANEOUS) ×1 IMPLANT
CANISTER SUCT 1200ML W/VALVE (MISCELLANEOUS) ×2 IMPLANT
CHLORAPREP W/TINT 26ML (MISCELLANEOUS) ×2 IMPLANT
CLIP APPLIE 11 MED OPEN (CLIP) IMPLANT
CLIP APPLIE 9.375 SM OPEN (CLIP) IMPLANT
DECANTER SPIKE VIAL GLASS SM (MISCELLANEOUS) ×2 IMPLANT
DERMABOND ADVANCED (GAUZE/BANDAGES/DRESSINGS) ×2
DERMABOND ADVANCED .7 DNX12 (GAUZE/BANDAGES/DRESSINGS) ×2 IMPLANT
DRESSING SURGICEL FIBRLLR 1X2 (HEMOSTASIS) ×1 IMPLANT
DRSG SURGICEL FIBRILLAR 1X2 (HEMOSTASIS) ×2
ELECT CAUTERY BLADE 6.4 (BLADE) ×2 IMPLANT
ELECT REM PT RETURN 9FT ADLT (ELECTROSURGICAL) ×2
ELECTRODE REM PT RTRN 9FT ADLT (ELECTROSURGICAL) ×1 IMPLANT
ETHIBOND 2 0 GREEN CT 2 30IN (SUTURE) ×2 IMPLANT
GEL ULTRASOUND 20GR AQUASONIC (MISCELLANEOUS) ×2 IMPLANT
GLOVE BIO SURGEON STRL SZ7 (GLOVE) ×2 IMPLANT
GLOVE INDICATOR 7.5 STRL GRN (GLOVE) ×2 IMPLANT
GLOVE SURG SYN 8.0 (GLOVE) ×2 IMPLANT
GOWN STRL REUS W/ TWL LRG LVL3 (GOWN DISPOSABLE) ×2 IMPLANT
GOWN STRL REUS W/ TWL XL LVL3 (GOWN DISPOSABLE) ×1 IMPLANT
GOWN STRL REUS W/TWL LRG LVL3 (GOWN DISPOSABLE) ×2
GOWN STRL REUS W/TWL XL LVL3 (GOWN DISPOSABLE) ×1
GRAFT PROPATEN STD WALL 4 7X45 (Vascular Products) ×2 IMPLANT
IV NS 500ML (IV SOLUTION) ×1
IV NS 500ML BAXH (IV SOLUTION) ×1 IMPLANT
KIT RM TURNOVER STRD PROC AR (KITS) ×2 IMPLANT
LABEL OR SOLS (LABEL) ×2 IMPLANT
LOOP RED MAXI  1X406MM (MISCELLANEOUS) ×1
LOOP VESSEL MAXI 1X406 RED (MISCELLANEOUS) ×1 IMPLANT
LOOP VESSEL MINI 0.8X406 BLUE (MISCELLANEOUS) ×2 IMPLANT
LOOPS BLUE MINI 0.8X406MM (MISCELLANEOUS) ×2
NEEDLE FILTER BLUNT 18X 1/2SAF (NEEDLE) ×1
NEEDLE FILTER BLUNT 18X1 1/2 (NEEDLE) ×1 IMPLANT
NS IRRIG 500ML POUR BTL (IV SOLUTION) ×2 IMPLANT
PACK EXTREMITY ARMC (MISCELLANEOUS) ×2 IMPLANT
PAD PREP 24X41 OB/GYN DISP (PERSONAL CARE ITEMS) ×2 IMPLANT
PUNCH SURGICAL ROTATE 2.7MM (MISCELLANEOUS) IMPLANT
SPONGE XRAY 4X4 16PLY STRL (MISCELLANEOUS) ×2 IMPLANT
STOCKINETTE STRL 4IN 9604848 (GAUZE/BANDAGES/DRESSINGS) ×2 IMPLANT
SUT GTX CV-6 30 (SUTURE) ×6 IMPLANT
SUT MNCRL+ 5-0 UNDYED PC-3 (SUTURE) ×2 IMPLANT
SUT MONOCRYL 5-0 (SUTURE) ×2
SUT PROLENE 6 0 BV (SUTURE) ×8 IMPLANT
SUT SILK 2 0 (SUTURE) ×1
SUT SILK 2 0 SH (SUTURE) ×2 IMPLANT
SUT SILK 2-0 18XBRD TIE 12 (SUTURE) ×1 IMPLANT
SUT SILK 3 0 (SUTURE) ×1
SUT SILK 3-0 18XBRD TIE 12 (SUTURE) ×1 IMPLANT
SUT SILK 4 0 (SUTURE) ×1
SUT SILK 4-0 18XBRD TIE 12 (SUTURE) ×1 IMPLANT
SUT VIC AB 3-0 SH 27 (SUTURE) ×2
SUT VIC AB 3-0 SH 27X BRD (SUTURE) ×2 IMPLANT
SYR 20CC LL (SYRINGE) ×2 IMPLANT
SYR 3ML LL SCALE MARK (SYRINGE) ×2 IMPLANT

## 2017-02-24 NOTE — Anesthesia Postprocedure Evaluation (Signed)
Anesthesia Post Note  Patient: Shawna [name]  Procedure(s) Performed: Procedure(s) (LRB): REVISION OF ARTERIOVENOUS GORETEX GRAFT (RESECTION) (N/A) INSERTION OF ARTERIOVENOUS (AV) GORE-TEX GRAFT ARM (BRACHIAL AXILLARY) (N/A)  Patient location during evaluation: PACU Anesthesia Type: General Level of consciousness: awake and alert Pain management: pain level controlled Vital Signs Assessment: post-procedure vital signs reviewed and stable Respiratory status: spontaneous breathing and respiratory function stable Cardiovascular status: stable Anesthetic complications: no     Last Vitals:  Vitals:   02/24/17 1515 02/24/17 1524  BP: (!) 151/63 (!) 151/63  Pulse: 79 71  Resp: (!) 22 16  Temp:    SpO2: 95% 99%    Last Pain:  Vitals:   02/24/17 1509  TempSrc:   PainSc: Asleep                 KEPHART,WILLIAM K

## 2017-02-24 NOTE — Anesthesia Preprocedure Evaluation (Signed)
Anesthesia Evaluation  Patient identified by MRN, date of birth, ID band Patient awake    Reviewed: Allergy & Precautions, NPO status , Patient's Chart, lab work & pertinent test results  History of Anesthesia Complications Negative for: history of anesthetic complications  Airway Mallampati: II  TM Distance: >3 FB Neck ROM: Full    Dental no notable dental hx.    Pulmonary neg pulmonary ROS, neg sleep apnea, neg COPD,    breath sounds clear to auscultation- rhonchi (-) wheezing      Cardiovascular hypertension, (-) angina+ CAD, + Past MI, + Cardiac Stents (last stent April 2018), + CABG (2013), + Peripheral Vascular Disease and +CHF (preserved EF)   Rhythm:Regular Rate:Normal - Systolic murmurs and - Diastolic murmurs L heart cath 10/13/16:  Ramus lesion, 90 %stenosed.  Dist LAD lesion, 80 %stenosed.  LIMA.  SVG.  Ost RCA to Prox RCA lesion, 100 %stenosed.  SVG.  Ost Cx to Prox Cx lesion, 90 %stenosed.  Mid LAD lesion, 75 %stenosed.  A STENT RESOLUTE ONYX 4.26S34 drug eluting stent was successfully placed, and overlaps previously placed stent.  Ost 1st Diag to 1st Diag lesion, 99 %stenosed.  Post intervention, there is a 0% residual stenosis.  1st Diag lesion, 20 %stenosed.   Severe multivessel native CAD with 99.9% ostial proximal diagonal 1 stenosis in a vessel which had been previously stented and now has TIMI 1 flow, 85% LAD stenosis immediately after the takeoff of this first diagonal vessel and 80% mid LAD stenosis just proximal to the insertion of the LIMA graft.  Ramus intermediate 90% ostial stenosis with competitive filling due a patent SVG.  Ostial Left circumflex 90% in-stent restenosis and occlusion of a marginal branch beyond the stented segment.  Total occlusion of a stent at the ostium of the RCA.  Patent LIMA graft supplying the mid distal LAD with insertion beyond the mid 80%  stenosis.  Patent SVG supplying the ramus intermediate vessel with 30% narrowing just beyond the anastomosis.  Patent SVG supplying the distal RCA which fills the entire RCA distally and antegrade up to the occluded ostial stent.  Successful percutaneous coronary intervention to the subtotally occluded diagonal vessel with the 99.9%  stenosis being reduced to 0% with PTCA and insertion of a 2.2515 mm Resolute Onyx DES stent into the previously placed stent post dilated to 2.45 mm and with the stenosis being reduced to 0% in the entire stented segment.  The sluggish TIMI-1 flow at the start of the procedure was improved to brisk TIMI-3 flow in the diagonal vessel following PCI. The LAD stenosis immediately after the diagonal vessel was 80% stenosed at the completion of the procedure.  Echo 10/12/16: - Left ventricle: The cavity size was normal. There was moderate   concentric hypertrophy. Systolic function was normal. The   estimated ejection fraction was in the range of 50% to 55%. Wall   motion was normal; there were no regional wall motion   abnormalities. Doppler parameters are consistent with abnormal   left ventricular relaxation (grade 1 diastolic dysfunction).   Doppler parameters are consistent with elevated ventricular   end-diastolic filling pressure. - Aortic valve: Trileaflet; normal thickness leaflets. There was no   regurgitation. - Aortic root: The aortic root was normal in size. - Mitral valve: Calcified annulus. Mildly thickened leaflets .   There was mild regurgitation. - Left atrium: The atrium was normal in size. - Right ventricle: Systolic function was normal. - Tricuspid valve: There was no regurgitation. -  Pulmonary arteries: Systolic pressure could not be accurately   estimated. - Inferior vena cava: The vessel was normal in size. - Pericardium, extracardiac: There was no pericardial effusion.   Neuro/Psych Anxiety TIA   GI/Hepatic PUD, GERD  Medicated,   Endo/Other    Renal/GU ESRF and DialysisRenal disease     Musculoskeletal  (+) Arthritis ,   Abdominal (+) - obese,   Peds  Hematology  (+) anemia ,   Anesthesia Other Findings Past Medical History: 10/10/2016: Acute on chronic respiratory failure with hypoxia (HCC) No date: Anxiety No date: Arthritis No date: AVM (arteriovenous malformation) of colon No date: CAD (coronary artery disease)     Comment:  a. 12/2011 NSTEMI/Cath/PCI LCX (2.25x14 Resolute DES) &               D1 (2.25x22 Resolute DES);  b. 01/2012 Cath/PCI: LM 30,               LAD 30p, 40-86m, D1 stent ok, 99 in sm branch of diag,               LCX patent stent, OM1 20, RCA 95 ost (4.0x12 Promus DES),              EF 55%;  c. 04/2012 Lexi Cardiolite  EF 48%, small area               of scar @ base/mid inflat wall with mild peri-infarct               ischemia.; CABG 12/4 No date: Carotid artery disease ([place])     Comment:  a. 44-81% LICA, 02/5630  No date: Chronic bronchitis (HCC) No date: Chronic diastolic CHF (congestive heart failure) ([place])     Comment:  a. 02/2012 Echo EF 60-65%, nl wall motion, Gr 1 DD, mod               MR 1992: Colon cancer ([place]) No date: Esophageal stricture No date: ESRD on hemodialysis [facility])     Comment:  ESRD due to HTN, started dialysis 2011 and gets HD at               [facility] with Dr Hinda Lenis on MWF schedule.  Access is               LUA AVF as of Sept 2014.  No date: GERD (gastroesophageal reflux disease) 12/2011: High cholesterol 07/2011; 12/2011; 01/2012 X 2; 04/2012: History of blood transfusion No date: History of gout No date: History of lower GI bleeding No date: Hypertension No date: Iron deficiency anemia No date: Mitral regurgitation     Comment:  a. Moderate by echo, 02/2012 No date: Myocardial infarction [facility]) 1992: Ovarian cancer ([place]) ~ 2009: Pneumonia No date: PUD (peptic ulcer disease) No date: TIA (transient ischemic attack)   Reproductive/Obstetrics                              Anesthesia Physical Anesthesia Plan  ASA: IV  Anesthesia Plan: General   Post-op Pain Management:    Induction: Intravenous  PONV Risk Score and Plan: 2 and Ondansetron and Dexamethasone  Airway Management Planned: LMA  Additional Equipment:   Intra-op Plan:   Post-operative Plan:   Informed Consent: I have reviewed the patients History and Physical, chart, labs and discussed the procedure including the risks, benefits and alternatives for the proposed anesthesia with the patient or authorized representative who  has indicated his/her understanding and acceptance.   Dental advisory given  Plan Discussed with: CRNA and Anesthesiologist  Anesthesia Plan Comments:         Anesthesia Quick Evaluation

## 2017-02-24 NOTE — Anesthesia Post-op Follow-up Note (Signed)
Anesthesia QCDR form completed.        

## 2017-02-24 NOTE — OR Nursing (Signed)
poct K+ is 5.4 today. Dr. Randa Lynn notified.

## 2017-02-24 NOTE — OR Nursing (Signed)
Dr Delana Meyer called about oozing from surgical site. States to put gauze dressing in place. No additional swelling noted from PACU.

## 2017-02-24 NOTE — Discharge Instructions (Signed)
AMBULATORY SURGERY  °DISCHARGE INSTRUCTIONS ° ° °1) The drugs that you were given will stay in your system until tomorrow so for the next 24 hours you should not: ° °A) Drive an automobile °B) Make any legal decisions °C) Drink any alcoholic beverage ° ° °2) You may resume regular meals tomorrow.  Today it is better to start with liquids and gradually work up to solid foods. ° °You may eat anything you prefer, but it is better to start with liquids, then soup and crackers, and gradually work up to solid foods. ° ° °3) Please notify your doctor immediately if you have any unusual bleeding, trouble breathing, redness and pain at the surgery site, drainage, fever, or pain not relieved by medication. ° ° ° °4) Additional Instructions: ° ° ° ° ° ° ° °Please contact your physician with any problems or Same Day Surgery at [phone], Monday through Friday 6 am to 4 pm, or [facility] [place] [facility] number at [phone]. °

## 2017-02-24 NOTE — Op Note (Signed)
OPERATIVE NOTE   PROCEDURE: Left brachial axillary arteriovenous graft placement Resection of aneurysmal nonfunctional fistula  PRE-OPERATIVE DIAGNOSIS: End Stage Renal Disease  POST-OPERATIVE DIAGNOSIS: End Stage Renal Disease  SURGEON: Hortencia Pilar  ASSISTANT(S): None  ANESTHESIA: general  ESTIMATED BLOOD LOSS: <50 cc  FINDING(S): Large aneurysmal fistula with occluded outflow  SPECIMEN(S):  Aneurysmal fistula resected and sent for specimen  INDICATIONS:   Shawna [name] is a 76 y.o. female who presents with end stage renal disease.  The patient is scheduled for left brachial axillary AV graft placement.  The patient is aware the risks include but are not limited to: bleeding, infection, steal syndrome, nerve damage, ischemic monomelic neuropathy, failure to mature, and need for additional procedures.  The patient is aware of the risks of the procedure and elects to proceed forward.  DESCRIPTION: After full informed written consent was obtained from the patient, the patient was brought back to the operating room and placed supine upon the operating table.  Prior to induction, the patient received IV antibiotics.   After obtaining adequate anesthesia, the patient was then prepped and draped in the standard fashion for a left arm access procedure.   A linear incision is created along the medial border of the aneurysmal fistula. Soft tissues then dissected exposing the aneurysms in the proximal portion of the fistula. The incision is then extended up to the fibrotic portion of the cephalic vein. Once the fistula has been dissected on the medial side a second linear incision was created along the lateral side of the aneurysms creating an elliptical incision. The aneurysmal fistula was then dissected on the lateral side creating a circumferential dissection. Using a 2-0 Ethibond the fibrotic segment was ligated. The aneurysmal fistula was then transected proximally and distally  leaving a short cuff. Hemostasis was then obtained with Bovie cautery. Subcutaneous tissues were then reapproximated with a running 3-0 Vicryl suture.   The brachial artery was then identified and looped with Silastic Vesseloops.   Attention was then turned to the exposure of the axillary vein. Linear incision was then created medial to the proximal portion of the biceps at the level of the anterior axillary crease. The axillary vein was exposed and again looped proximally and distally with Silastic vessel loops. Associated tributaries were also controlled with Silastic Vesseloops.  The Gore tunneler was then delivered onto the field and a subcutaneous path was made from the arterial incision to the venous incision. A 4-7 tapered PTFE propatent graft by Simeon Craft was then pulled through the subcutaneous tunnel.  The Gore graft was then trimmed to an appropriate bevel and end graft to side brachial artery anastomosis was then fashioned with running CV 6 suture. Flushing maneuvers were performed suture line was hemostatic and the graft was then assessed for proper position and ease of future cannulation. Heparinized saline was infused into the vein and the graft was clamped with a vascular clamp. With the graft pressurized it was approximated to the axillary vein in its native bed and then marked with a surgical marker. The vein was then delivered into the surgical field and controlled with the Silastic vessel loops. Venotomy was then made with an 11 blade scalpel and extended with Potts scissors and a 6-0 Prolene suture was used as stay suture. The the graft was then sewn to the vein in an end graft to side vein fashion using running CV 6 suture.  Flushing maneuvers were performed and the artery was allowed to forward and  back bleed.  Flow was then established through the AV graft  There was good  thrill in the venous outflow, and there was 1+ palpable radial pulse.  At this point, I irrigated out the surgical  wounds.  There was no further active bleeding.  The subcutaneous tissue was reapproximated with a running stitch of 3-0 Vicryl.  The skin for both incisions was then reapproximated with a running subcuticular stitch of 4-0 Vicryl.  The skin was then cleaned, dried, and reinforced with Dermabond.    The patient tolerated this procedure well.   COMPLICATIONS: None  CONDITION: Shawna [name] [name]  Office: [phone]   02/24/2017, 4:03 PM

## 2017-02-24 NOTE — Anesthesia Procedure Notes (Signed)
Procedure Name: LMA Insertion Date/Time: 02/24/2017 12:52 PM Performed by: Rolla Plate Pre-anesthesia Checklist: Patient identified, Patient being monitored, Timeout performed, Emergency Drugs available and Suction available Patient Re-evaluated:Patient Re-evaluated prior to induction Oxygen Delivery Method: Circle system utilized Preoxygenation: Pre-oxygenation with 100% oxygen Induction Type: IV induction Ventilation: Mask ventilation without difficulty LMA: LMA inserted LMA Size: 3.5 Tube type: Oral Number of attempts: 1 Placement Confirmation: positive ETCO2 and breath sounds checked- equal and bilateral Tube secured with: Tape Dental Injury: Teeth and Oropharynx as per pre-operative assessment

## 2017-02-24 NOTE — H&P (Signed)
[facility] Admission History & Physical  MRN : [id number]  Shawna [name] is a 76 y.o. (04/29/1941) female who presents with chief complaint of problems with my dialysis fistula.  History of Present Illness: The patient is a 76 year old woman who has a nonfunctioning left arm fistula and a tunneled catheter that has been functioning poorly.  The patient underwent angiography which demonstrated that her left arm fistula is occluded more proximally subsequently pressurizing the aneurysm isn't causing them to expand and be painful while being a nonfunctional useless fistula. Also proven at that time was a patent axillary subclavian innominate and superior vena cava. The patient is therefore undergoing creation of a left arm brachial axillary graft with resection of her aneurysmal fistula.  Current Facility-Administered Medications  Medication Dose Route Frequency Provider Last Rate Last Dose  . 0.9 %  sodium chloride infusion   Intravenous Continuous [place], Amy, MD      . clindamycin (CLEOCIN) 300 MG/50ML IVPB           . clindamycin (CLEOCIN) IVPB 300 mg  300 mg Intravenous Once Alantra Popoca, Dolores Lory, MD        Past Medical History:  Diagnosis Date  . Acute on chronic respiratory failure with hypoxia ([place]) 10/10/2016  . Anxiety   . Arthritis   . AVM (arteriovenous malformation) of colon   . CAD (coronary artery disease)    a. 12/2011 NSTEMI/Cath/PCI LCX (2.25x14 Resolute DES) & D1 (2.25x22 Resolute DES);  b. 01/2012 Cath/PCI: LM 30, LAD 30p, 40-103m, D1 stent ok, 99 in sm branch of diag, LCX patent stent, OM1 20, RCA 95 ost (4.0x12 Promus DES), EF 55%;  c. 04/2012 Lexi Cardiolite  EF 48%, small area of scar @ base/mid inflat wall with mild peri-infarct ischemia.; CABG 12/4  . Carotid artery disease ([place])    a. 34-19% LICA, 09/7900   . Chronic bronchitis ([place])   . Chronic diastolic CHF (congestive heart failure) ([place])    a. 02/2012 Echo EF 60-65%, nl wall motion, Gr 1  DD, mod MR  . Colon cancer ([place]) 1992  . Esophageal stricture   . ESRD on hemodialysis ([place])    ESRD due to HTN, started dialysis 2011 and gets HD at [facility] with Dr Hinda Lenis on MWF schedule.  Access is LUA AVF as of Sept 2014.   Marland Kitchen GERD (gastroesophageal reflux disease)   . High cholesterol 12/2011  . History of blood transfusion 07/2011; 12/2011; 01/2012 X 2; 04/2012  . History of gout   . History of lower GI bleeding   . Hypertension   . Iron deficiency anemia   . Mitral regurgitation    a. Moderate by echo, 02/2012  . Myocardial infarction ([place])   . Ovarian cancer ([place]) 1992  . Pneumonia ~ 2009  . PUD (peptic ulcer disease)   . TIA (transient ischemic attack)     Past Surgical History:  Procedure Laterality Date  . ABDOMINAL HYSTERECTOMY  1992  . APPENDECTOMY  06/1990  . AV FISTULA PLACEMENT  07/2009   left upper arm  . AV FISTULA PLACEMENT Right 09/06/2016   Procedure: RIGHT FOREARM ARTERIOVENOUS (AV) GRAFT;  Surgeon: Elam Dutch, MD;  Location: [facility];  Service: Vascular;  Laterality: Right;  . [place] REMOVAL Right 09/06/2016   Procedure: REMOVAL OF Right Arm ARTERIOVENOUS GORETEX GRAFT and Vein Patch angioplasty of brachial artery;  Surgeon: Angelia Mould, MD;  Location: [facility];  Service: Vascular;  Laterality: Right;  . COLON  RESECTION  1992  . COLON SURGERY    . CORONARY ANGIOPLASTY WITH STENT PLACEMENT  12/15/11   "2"  . CORONARY ANGIOPLASTY WITH STENT PLACEMENT  y/2013   "1; makes total of 3" (05/02/2012)  . CORONARY ARTERY BYPASS GRAFT  06/13/2012   Procedure: CORONARY ARTERY BYPASS GRAFTING (CABG);  Surgeon: Grace Isaac, MD;  Location: [facility];  Service: Open Heart Surgery;  Laterality: N/A;  cabg x four;  using left internal mammary artery, and left leg greater saphenous vein harvested endoscopically  . CORONARY STENT INTERVENTION N/A 10/13/2016   Procedure: Coronary Stent Intervention;  Surgeon: Troy Sine, MD;  Location: [place] [facility];  Service:  Cardiovascular;  Laterality: N/A;  . DILATION AND CURETTAGE OF UTERUS    . ESOPHAGOGASTRODUODENOSCOPY  01/20/2012   Procedure: ESOPHAGOGASTRODUODENOSCOPY (EGD);  Surgeon: Ladene Artist, MD,FACG;  Location: [facility] ENDOSCOPY;  Service: Endoscopy;  Laterality: N/A;  . ESOPHAGOGASTRODUODENOSCOPY N/A 03/26/2013   Procedure: ESOPHAGOGASTRODUODENOSCOPY (EGD);  Surgeon: Irene Shipper, MD;  Location: [facility] ENDOSCOPY;  Service: Endoscopy;  Laterality: N/A;  . ESOPHAGOGASTRODUODENOSCOPY N/A 04/30/2015   Procedure: ESOPHAGOGASTRODUODENOSCOPY (EGD);  Surgeon: Rogene Houston, MD;  [facility];  Service: Endoscopy;  Laterality: N/A;  1pm - moved to 10/20 @ 1:10  . ESOPHAGOGASTRODUODENOSCOPY N/A 07/29/2016   Procedure: ESOPHAGOGASTRODUODENOSCOPY (EGD);  Surgeon: Manus Gunning, MD;  Location: [facility];  Service: Gastroenterology;  Laterality: N/A;  enteroscopy  . INTRAOPERATIVE TRANSESOPHAGEAL ECHOCARDIOGRAM  06/13/2012   Procedure: INTRAOPERATIVE TRANSESOPHAGEAL ECHOCARDIOGRAM;  Surgeon: Grace Isaac, MD;  Location: [facility];  Service: Open Heart Surgery;  Laterality: N/A;  . IR GENERIC HISTORICAL  07/26/2016   IR FLUORO GUIDE CV LINE RIGHT 07/26/2016 Greggory Keen, MD [facility]  . IR GENERIC HISTORICAL  07/26/2016   IR US GUIDE VASC ACCESS RIGHT 07/26/2016 Greggory Keen, MD [facility]  . IR GENERIC HISTORICAL  08/02/2016   IR US GUIDE VASC ACCESS RIGHT 08/02/2016 Greggory Keen, MD [facility]  . IR GENERIC HISTORICAL  08/02/2016   IR FLUORO GUIDE CV LINE RIGHT 08/02/2016 Greggory Keen, MD [facility]  . LEFT HEART CATH AND CORONARY ANGIOGRAPHY N/A 09/20/2016   Procedure: Left Heart Cath and Coronary Angiography;  Surgeon: [name], MD;  Location: [facility];  Service: Cardiovascular;  Laterality: N/A;  . LEFT HEART CATH AND CORS/GRAFTS ANGIOGRAPHY N/A 10/13/2016   Procedure: Left Heart Cath and Cors/Grafts Angiography;  Surgeon: Troy Sine, MD;  Location: [facility]  [facility];  Service: Cardiovascular;  Laterality: N/A;  . LEFT HEART CATHETERIZATION WITH CORONARY ANGIOGRAM N/A 12/15/2011   Procedure: LEFT HEART CATHETERIZATION WITH CORONARY ANGIOGRAM;  Surgeon: Burnell Blanks, MD;  Location: [facility] CATH LAB;  Service: Cardiovascular;  Laterality: N/A;  . LEFT HEART CATHETERIZATION WITH CORONARY ANGIOGRAM N/A 01/10/2012   Procedure: LEFT HEART CATHETERIZATION WITH CORONARY ANGIOGRAM;  Surgeon: Peter M Martinique, MD;  Location: [facility] CATH LAB;  Service: Cardiovascular;  Laterality: N/A;  . LEFT HEART CATHETERIZATION WITH CORONARY ANGIOGRAM N/A 06/08/2012   Procedure: LEFT HEART CATHETERIZATION WITH CORONARY ANGIOGRAM;  Surgeon: Burnell Blanks, MD;  Location: [facility] CATH LAB;  Service: Cardiovascular;  Laterality: N/A;  . LEFT HEART CATHETERIZATION WITH CORONARY/GRAFT ANGIOGRAM N/A 12/10/2013   Procedure: LEFT HEART CATHETERIZATION WITH Beatrix Fetters;  Surgeon: [name], MD;  Location: [facility] CATH LAB;  Service: Cardiovascular;  Laterality: N/A;  . OVARY SURGERY     ovarian cancer  . SHUNTOGRAM N/A 10/15/2013   Procedure: Fistulogram;  Surgeon: Serafina Mitchell, MD;  Location: [facility] CATH LAB;  Service: Cardiovascular;  Laterality: N/A;  . THROMBECTOMY / ARTERIOVENOUS GRAFT REVISION  2011   left upper arm  . TUBAL LIGATION  1980's  . UPPER EXTREMITY ANGIOGRAPHY Bilateral 12/06/2016   Procedure: Upper Extremity Angiography;  Surgeon: Katha Cabal, MD;  Location: [place] [facility];  Service: Cardiovascular;  Laterality: Bilateral;    Social History Social History  Substance Use Topics  . Smoking status: Never Smoker  . Smokeless tobacco: Never Used  . Alcohol use No    Family History Family History  Problem Relation Age of Onset  . Heart disease Mother        Heart Disease before age 28  . Hyperlipidemia Mother   . Hypertension Mother   . Diabetes Mother   . Heart attack Mother   . Heart disease Father        Heart Disease before age 40   . Hyperlipidemia Father   . Hypertension Father   . Diabetes Father   . Diabetes Sister   . Hypertension Sister   . Diabetes Brother   . Hyperlipidemia Brother   . Heart attack Brother   . Hypertension Sister   . Heart attack Brother   . Other Unknown        noncontributory for early CAD  . Colon cancer Neg Hx   . Esophageal cancer Neg Hx   . Liver disease Neg Hx   . Kidney disease Neg Hx   . Colon polyps Neg Hx   No family history of bleeding/clotting disorders, porphyria or autoimmune disease   Allergies  Allergen Reactions  . Aspirin Other (See Comments)    High Doses Mess up her stomach; "makes my bowels have blood in them". Takes 81 mg EC Aspirin   . Penicillins Other (See Comments)    SYNCOPE? , "makes me real weak when I take it; like I'll pass out"  Has patient had a PCN reaction causing immediate rash, facial/tongue/throat swelling, SOB or lightheadedness with hypotension: Yes Has patient had a PCN reaction causing severe rash involving mucus membranes or skin necrosis: no Has patient had a PCN reaction that required hospitalization no Has patient had a PCN reaction occurring within the last 10 years: no If all of the above  . Amlodipine Swelling  . Bactrim [Sulfamethoxazole-Trimethoprim] Rash  . Contrast Media [Iodinated Diagnostic Agents] Itching  . Iron Itching and Other (See Comments)    "they gave me iron in dialysis; had to give me Benadryl cause I had to have the iron" (05/02/2012)  . Nitrofurantoin Hives  . Tylenol [Acetaminophen] Itching and Other (See Comments)    Makes her feet on fire per pt  . Gabapentin Other (See Comments)    unspecified  . Dexilant [Dexlansoprazole] Other (See Comments)    Upset stomach  . Levaquin [Levofloxacin In D5w] Rash  . Morphine And Related Itching    Itching in feet  . Plavix [Clopidogrel Bisulfate] Rash  . Protonix [Pantoprazole Sodium] Rash  . Venofer [Ferric Oxide] Itching    Patient reports using Benadryl  prior to doses as [place] (Negative unless checked)  Constitutional: [] Weight loss  [] Fever  [] Chills Cardiac: [x] Chest pain   [] Chest pressure   [] Palpitations   [] Shortness of breath when laying flat   [] Shortness of breath at rest   [x] Shortness of breath with exertion. Vascular:  [] Pain in legs with walking   [] Pain  in legs at rest   [] Pain in legs when laying flat   [] Claudication   [] Pain in feet when walking  [] Pain in feet at rest  [] Pain in feet when laying flat   [] History of DVT   [] Phlebitis   [] Swelling in legs   [] Varicose veins   [] Non-healing ulcers Pulmonary:   [] Uses home oxygen   [] Productive cough   [] Hemoptysis   [] Wheeze  [] COPD   [] Asthma Neurologic:  [] Dizziness  [] Blackouts   [] Seizures   [] History of stroke   [] History of TIA  [] Aphasia   [] Temporary blindness   [] Dysphagia   [] Weakness or numbness in arms   [] Weakness or numbness in legs Musculoskeletal:  [] Arthritis   [] Joint swelling   [] Joint pain   [] Low back pain Hematologic:  [] Easy bruising  [] Easy bleeding   [] Hypercoagulable state   [] Anemic  [] Hepatitis Gastrointestinal:  [] Blood in stool   [] Vomiting blood  [] Gastroesophageal reflux/heartburn   [] Difficulty swallowing. Genitourinary:  [x] Chronic kidney disease   [] Difficult urination  [] Frequent urination  [] Burning with urination   [] Blood in urine Skin:  [] Rashes   [] Ulcers   [] Wounds Psychological:  [] History of anxiety   []  History of major depression.  Physical Examination  There were no vitals filed for this visit. There is no height or weight on file to calculate BMI. Gen: WD/WN, NAD Head: [place]/AT, No temporalis wasting. Prominent temp pulse not noted. Ear/Nose/Throat: Hearing grossly intact, nares w/o erythema or drainage, oropharynx w/o Erythema/Exudate,  Eyes: Conjunctiva clear, sclera non-icteric Neck: Trachea midline.  No JVD.  Pulmonary:  Good air movement, respirations not labored, no use of accessory muscles.   Cardiac: RRR, normal S1, S2. Vascular: Left arm brachiocephalic fistula with large 3 cm aneurysms markedly pulsatile with very staccato bruit. Vessel Right Left  Radial Palpable Palpable  Ulnar Palpable Palpable  Brachial Palpable Palpable  Gastrointestinal: soft, non-tender/non-distended. No guarding/reflex.  Musculoskeletal: M/S 5/5 throughout.  Extremities without ischemic changes.  No deformity or atrophy.  Neurologic: Sensation grossly intact in extremities.  Symmetrical.  Speech is fluent. Motor exam as listed above. Psychiatric: Judgment intact, Mood & affect appropriate for pt's clinical situation. Dermatologic: No rashes or ulcers noted.  No cellulitis or open wounds. Lymph : No Cervical, Axillary, or Inguinal lymphadenopathy.     CBC Lab Results  Component Value Date   WBC 6.5 02/09/2017   HGB 13.9 02/24/2017   HCT 41.0 02/24/2017   MCV 95.5 02/09/2017   PLT 208 02/09/2017    BMET    Component Value Date/Time   NA 136 02/24/2017 1210   K 5.4 (H) 02/24/2017 1210   CL 97 (L) 02/09/2017 0609   CO2 23 02/09/2017 0609   GLUCOSE 91 02/24/2017 1210   BUN 58 (H) 02/09/2017 0609   CREATININE 10.35 (H) 02/09/2017 0609   CALCIUM 7.9 (L) 02/09/2017 0609   CALCIUM 8.1 (L) 05/29/2013 1814   GFRNONAA 3 (L) 02/09/2017 0609   GFRAA 4 (L) 02/09/2017 0609   Estimated Creatinine Clearance: 3.7 mL/min (A) (by C-G formula based on SCr of 10.35 mg/dL (H)).  COAG Lab Results  Component Value Date   INR 1.01 02/08/2017   INR 1.05 01/24/2017   INR 0.91 10/09/2016    Radiology Dg Chest Port 1 View  Result Date: 02/08/2017 CLINICAL DATA:  Chest pain EXAM: PORTABLE CHEST 1 VIEW COMPARISON:  January 20, 2017 FINDINGS: There is slight scarring in the left base. Lungs elsewhere clear. Heart is mildly enlarged with pulmonary vascularity within normal  limits. No adenopathy. There is aortic atherosclerosis. Central catheter tip is in the superior vena cava. No pneumothorax. No bone  lesions. IMPRESSION: Slight scarring left base. No edema or consolidation. Stable cardiac prominence. There is aortic atherosclerosis. Aortic Atherosclerosis (ICD10-I70.0). Electronically Signed   By: Lowella Grip III M.D.   On: 02/08/2017 12:42    Assessment/Plan 1.  Complication dialysis device with Occlusion and aneurysmal deterioration of the left arm AV access:  Patient's left brachial cephalic dialysis access is occluded proximally. The patient will undergo resection of the painful aneurysmal segment. Potassium will be drawn to ensure that it is an appropriate level prior to performing thrombectomy. 2.  End-stage renal disease requiring hemodialysis:  Patient will continue dialysis therapy without further interruption.  The patient will undergo creation of a brachial axillary dialysis graft and once this is healed the catheter can be removed and dialysis will be maintained via the left arm.. Dialysis has already been arranged since the patient missed their previous session 3.  Hypertension:  Patient will continue medical management; nephrology is following no changes in oral medications. 4. Coronary artery disease:  EKG will be monitored. Nitrates will be used if needed. The patient's oral cardiac medications will be continued.     Hortencia Pilar, MD  02/24/2017 12:22 PM

## 2017-02-24 NOTE — Transfer of Care (Signed)
Immediate Anesthesia Transfer of Care Note  Patient: Shawna [name]  Procedure(s) Performed: Procedure(s): REVISION OF ARTERIOVENOUS GORETEX GRAFT (RESECTION) (N/A) INSERTION OF ARTERIOVENOUS (AV) GORE-TEX GRAFT ARM (BRACHIAL AXILLARY) (N/A)  Patient Location: PACU  Anesthesia Type:General  Level of Consciousness: awake  Airway & Oxygen Therapy: Patient Spontanous Breathing and Patient connected to face mask oxygen  Post-op Assessment: Report given to RN and Post -op Vital signs reviewed and stable  Post vital signs: Reviewed  Last Vitals:  Vitals:   02/24/17 1221 02/24/17 1507  BP: 133/60 (!) 185/98  Pulse: 64 93  Resp: 18 10  Temp: 36.8 C (!) 36.4 C  SpO2: 100% 100%    Last Pain:  Vitals:   02/24/17 1221  TempSrc: Tympanic  PainSc: 0-No pain         Complications: No apparent anesthesia complications

## 2017-02-25 ENCOUNTER — Encounter: Payer: Self-pay | Admitting: Vascular Surgery

## 2017-02-25 DIAGNOSIS — N186 End stage renal disease: Secondary | ICD-10-CM | POA: Diagnosis not present

## 2017-02-25 DIAGNOSIS — Z992 Dependence on renal dialysis: Secondary | ICD-10-CM | POA: Diagnosis not present

## 2017-02-25 DIAGNOSIS — E8770 Fluid overload, unspecified: Secondary | ICD-10-CM | POA: Diagnosis not present

## 2017-02-28 LAB — SURGICAL PATHOLOGY

## 2017-03-01 ENCOUNTER — Encounter: Payer: Self-pay | Admitting: Vascular Surgery

## 2017-03-01 NOTE — Addendum Note (Signed)
Addendum  created 03/01/17 1157 by Albertha Ghee, MD   Sign clinical note

## 2017-03-02 ENCOUNTER — Encounter: Payer: Self-pay | Admitting: Adult Health

## 2017-03-02 ENCOUNTER — Ambulatory Visit (INDEPENDENT_AMBULATORY_CARE_PROVIDER_SITE_OTHER): Payer: Medicare HMO | Admitting: Adult Health

## 2017-03-02 VITALS — BP 140/68 | HR 67 | Ht 62.0 in | Wt 129.0 lb

## 2017-03-02 DIAGNOSIS — I2510 Atherosclerotic heart disease of native coronary artery without angina pectoris: Secondary | ICD-10-CM

## 2017-03-02 DIAGNOSIS — E7800 Pure hypercholesterolemia, unspecified: Secondary | ICD-10-CM

## 2017-03-02 DIAGNOSIS — I10 Essential (primary) hypertension: Secondary | ICD-10-CM | POA: Diagnosis not present

## 2017-03-02 DIAGNOSIS — N186 End stage renal disease: Secondary | ICD-10-CM

## 2017-03-02 NOTE — Patient Instructions (Signed)
Medication Instructions:  Your physician recommends that you continue on your current medications as directed. Please refer to the Current Medication list given to you today.   Labwork: NONE  Testing/Procedures: NONE  Follow-Up: Your physician recommends that you schedule a follow-up appointment in: 3 MONTHS   Any Other Special Instructions Will Be Listed Below (If Applicable).     If you need a refill on your cardiac medications before your next appointment, please call your pharmacy.  Thank you for choosing [facility]!

## 2017-03-02 NOTE — Progress Notes (Signed)
Cardiology Office Note   Date:  03/02/2017   ID:  Shawna [name], Shawna [name] 05-18-1941, MRN [id number]  PCP:  Shawna Percy, MD  Cardiologist:  Shawna [name]  Chief Complaint  Patient presents with  . Hypertension  . Atrial Fibrillation      History of Present Illness: Shawna [name] is a 76 y.o. female who presents for post hospitalization follow-up after admission for hypertensive urgency and chest pain. He was admitted to [facility] in the setting of a "Code STEMI" from [facility]. However, code STEMI was canceled.   She had cardiac catheterization in April 2018 , this required overlapping drug-eluting stents to the diagonal branch, was noted to have previous CABG with patent grafts to the mid LAD, intermediate, and distal RCA. Other history includes paroxysmal atrial fibrillation but not anticoagulated with a history of GI bleeding secondary to AVM's, end-stage renal disease with dialysis..  On arrival to the emergency room she was found to be hypertensive with mildly elevated troponin at 0.03. Once blood pressure was well-controlled she became asymptomatic. She was to continue current medication regimen and ongoing dialysis.  She comes today without significant complaints. She states that she takes her blood pressure a few times a day and if it is low she will not take hydralazine that day. It is usually on the days that she has dialysis. She occasionally feels her heart rate be elevated but that is rare. The patient also has some complaints of bruising on Brilinta. This bothers her. She denies hemoptysis melena epistaxis   Past Medical History:  Diagnosis Date  . Acute on chronic respiratory failure with hypoxia ([place]) 10/10/2016  . Anxiety   . Arthritis   . AVM (arteriovenous malformation) of colon   . CAD (coronary artery disease)    a. 12/2011 NSTEMI/Cath/PCI LCX (2.25x14 Resolute DES) & D1 (2.25x22 Resolute DES);  b. 01/2012 Cath/PCI: LM 30, LAD 30p, 40-32m, D1 stent ok, 99 in  sm branch of diag, LCX patent stent, OM1 20, RCA 95 ost (4.0x12 Promus DES), EF 55%;  c. 04/2012 Lexi Cardiolite  EF 48%, small area of scar @ base/mid inflat wall with mild peri-infarct ischemia.; CABG 12/4  . Carotid artery disease ([place])    a. 54-62% LICA, 01/349   . Chronic bronchitis ([place])   . Chronic diastolic CHF (congestive heart failure) ([place])    a. 02/2012 Echo EF 60-65%, nl wall motion, Gr 1 DD, mod MR  . Colon cancer ([place]) 1992  . Esophageal stricture   . ESRD on hemodialysis ([place])    ESRD due to HTN, started dialysis 2011 and gets HD at [facility] with Dr Hinda Lenis on MWF schedule.  Access is LUA AVF as of Sept 2014.   Marland Kitchen GERD (gastroesophageal reflux disease)   . High cholesterol 12/2011  . History of blood transfusion 07/2011; 12/2011; 01/2012 X 2; 04/2012  . History of gout   . History of lower GI bleeding   . Hypertension   . Iron deficiency anemia   . Mitral regurgitation    a. Moderate by echo, 02/2012  . Myocardial infarction ([place])   . Ovarian cancer ([place]) 1992  . Pneumonia ~ 2009  . PUD (peptic ulcer disease)   . TIA (transient ischemic attack)     Past Surgical History:  Procedure Laterality Date  . ABDOMINAL HYSTERECTOMY  1992  . APPENDECTOMY  06/1990  . AV FISTULA PLACEMENT  07/2009   left upper arm  . AV FISTULA PLACEMENT Right 09/06/2016  Procedure: RIGHT FOREARM ARTERIOVENOUS (AV) GRAFT;  Surgeon: Elam Dutch, MD;  Location: [facility] OR;  Service: Vascular;  Laterality: Right;  . AV FISTULA PLACEMENT N/A 02/24/2017   Procedure: INSERTION OF ARTERIOVENOUS (AV) GORE-TEX GRAFT ARM (BRACHIAL AXILLARY);  Surgeon: Katha Cabal, MD;  Location: [facility] ORS;  Service: Vascular;  Laterality: N/A;  . [place] Right 09/06/2016   Procedure: REMOVAL OF Right Arm ARTERIOVENOUS GORETEX GRAFT and Vein Patch angioplasty of brachial artery;  Surgeon: Angelia Mould, MD;  Location: [place];  Service: Vascular;  Laterality: Right;  . COLON RESECTION  1992  . COLON SURGERY     . CORONARY ANGIOPLASTY WITH STENT PLACEMENT  12/15/11   "2"  . CORONARY ANGIOPLASTY WITH STENT PLACEMENT  y/2013   "1; makes total of 3" (05/02/2012)  . CORONARY ARTERY BYPASS GRAFT  06/13/2012   Procedure: CORONARY ARTERY BYPASS GRAFTING (CABG);  Surgeon: Grace Isaac, MD;  Location: [place];  Service: Open Heart Surgery;  Laterality: N/A;  cabg x four;  using left internal mammary artery, and left leg greater saphenous vein harvested endoscopically  . CORONARY STENT INTERVENTION N/A 10/13/2016   Procedure: Coronary Stent Intervention;  Surgeon: Troy Sine, MD;  Location: [place] CV LAB;  Service: Cardiovascular;  Laterality: N/A;  . DILATION AND CURETTAGE OF UTERUS    . ESOPHAGOGASTRODUODENOSCOPY  01/20/2012   Procedure: ESOPHAGOGASTRODUODENOSCOPY (EGD);  Surgeon: Ladene Artist, MD,FACG;  Location: [facility] ENDOSCOPY;  Service: Endoscopy;  Laterality: N/A;  . ESOPHAGOGASTRODUODENOSCOPY N/A 03/26/2013   Procedure: ESOPHAGOGASTRODUODENOSCOPY (EGD);  Surgeon: Irene Shipper, MD;  Location: [facility] ENDOSCOPY;  Service: Endoscopy;  Laterality: N/A;  . ESOPHAGOGASTRODUODENOSCOPY N/A 04/30/2015   Procedure: ESOPHAGOGASTRODUODENOSCOPY (EGD);  Surgeon: Rogene Houston, MD;  [facility];  Service: Endoscopy;  Laterality: N/A;  1pm - moved to 10/20 @ 1:10  . ESOPHAGOGASTRODUODENOSCOPY N/A 07/29/2016   Procedure: ESOPHAGOGASTRODUODENOSCOPY (EGD);  Surgeon: Manus Gunning, MD;  Location: [place];  Service: Gastroenterology;  Laterality: N/A;  enteroscopy  . INTRAOPERATIVE TRANSESOPHAGEAL ECHOCARDIOGRAM  06/13/2012   Procedure: INTRAOPERATIVE TRANSESOPHAGEAL ECHOCARDIOGRAM;  Surgeon: Grace Isaac, MD;  Location: [place];  Service: Open Heart Surgery;  Laterality: N/A;  . IR GENERIC HISTORICAL  07/26/2016   IR FLUORO GUIDE CV LINE RIGHT 07/26/2016 Greggory Keen, MD [facility]  . IR GENERIC HISTORICAL  07/26/2016   IR US GUIDE VASC ACCESS RIGHT 07/26/2016 Greggory Keen, MD [facility]   . IR GENERIC HISTORICAL  08/02/2016   IR US GUIDE VASC ACCESS RIGHT 08/02/2016 Greggory Keen, MD [facility]  . IR GENERIC HISTORICAL  08/02/2016   IR FLUORO GUIDE CV LINE RIGHT 08/02/2016 Greggory Keen, MD [facility]  . LEFT HEART CATH AND CORONARY ANGIOGRAPHY N/A 09/20/2016   Procedure: Left Heart Cath and Coronary Angiography;  Surgeon: [name], MD;  Location: [place] CV LAB;  Service: Cardiovascular;  Laterality: N/A;  . LEFT HEART CATH AND CORS/GRAFTS ANGIOGRAPHY N/A 10/13/2016   Procedure: Left Heart Cath and Cors/Grafts Angiography;  Surgeon: Troy Sine, MD;  Location: [place] CV LAB;  Service: Cardiovascular;  Laterality: N/A;  . LEFT HEART CATHETERIZATION WITH CORONARY ANGIOGRAM N/A 12/15/2011   Procedure: LEFT HEART CATHETERIZATION WITH CORONARY ANGIOGRAM;  Surgeon: Burnell Blanks, MD;  Location: [facility] CATH LAB;  Service: Cardiovascular;  Laterality: N/A;  . LEFT HEART CATHETERIZATION WITH CORONARY ANGIOGRAM N/A 01/10/2012   Procedure: LEFT HEART CATHETERIZATION WITH CORONARY ANGIOGRAM;  Surgeon: Peter M Martinique, MD;  Location: [facility] CATH  LAB;  Service: Cardiovascular;  Laterality: N/A;  . LEFT HEART CATHETERIZATION WITH CORONARY ANGIOGRAM N/A 06/08/2012   Procedure: LEFT HEART CATHETERIZATION WITH CORONARY ANGIOGRAM;  Surgeon: Burnell Blanks, MD;  Location: [facility] CATH LAB;  Service: Cardiovascular;  Laterality: N/A;  . LEFT HEART CATHETERIZATION WITH CORONARY/GRAFT ANGIOGRAM N/A 12/10/2013   Procedure: LEFT HEART CATHETERIZATION WITH Beatrix Fetters;  Surgeon: [name], MD;  Location: [facility] CATH LAB;  Service: Cardiovascular;  Laterality: N/A;  . OVARY SURGERY     ovarian cancer  . REVISION OF ARTERIOVENOUS GORETEX GRAFT N/A 02/24/2017   Procedure: REVISION OF ARTERIOVENOUS GORETEX GRAFT (RESECTION);  Surgeon: Katha Cabal, MD;  Location: [facility] ORS;  Service: Vascular;  Laterality: N/A;  Earney [place] N/A 10/15/2013   Procedure: Fistulogram;   Surgeon: Serafina Mitchell, MD;  Location: [facility] CATH LAB;  Service: Cardiovascular;  Laterality: N/A;  . THROMBECTOMY / ARTERIOVENOUS GRAFT REVISION  2011   left upper arm  . TUBAL LIGATION  1980's  . UPPER EXTREMITY ANGIOGRAPHY Bilateral 12/06/2016   Procedure: Upper Extremity Angiography;  Surgeon: Katha Cabal, MD;  Location: [place] CV LAB;  Service: Cardiovascular;  Laterality: Bilateral;     Current Outpatient Prescriptions  Medication Sig Dispense Refill  . ALPRAZolam (XANAX) 0.25 MG tablet Take 0.25 mg by mouth 2 (two) times daily as needed for anxiety or sleep.     Marland Kitchen amiodarone (PACERONE) 200 MG tablet Take 1 tablet (200 mg total) by mouth daily. 30 tablet 3  . aspirin EC 81 MG tablet Take 81 mg by mouth daily.     . carvedilol (COREG) 6.25 MG tablet Take 1 tablet (6.25 mg total) by mouth 2 (two) times daily with a meal. 180 tablet 1  . cinacalcet (SENSIPAR) 30 MG tablet Take 30 mg by mouth daily after supper.    . cloNIDine (CATAPRES) 0.2 MG tablet Take 1 tablet (0.2 mg total) by mouth 3 (three) times daily. (Patient taking differently: Take 0.2 mg by mouth 2 (two) times daily as needed (high blood pressure). ) 90 tablet 3  . diphenhydrAMINE (BENADRYL) 25 mg capsule Take 50 mg by mouth daily as needed for itching or allergies.     Marland Kitchen epoetin alfa (EPOGEN,PROCRIT) 38250 UNIT/ML injection Inject 1 mL (10,000 Units total) into the vein every Monday, Wednesday, and Friday with hemodialysis. 1 mL 10  . fluticasone (FLONASE) 50 MCG/ACT nasal spray Place 2 sprays into the nose at bedtime as needed for allergies.     . isosorbide mononitrate (IMDUR) 120 MG 24 hr tablet Take 1 tablet (120 mg total) by mouth daily. 30 tablet 2  . loratadine (CLARITIN) 10 MG tablet Take 10 mg by mouth daily as needed for allergies.     . multivitamin (RENA-VIT) TABS tablet Take 1 tablet by mouth daily.    . nitroGLYCERIN (NITROSTAT) 0.4 MG SL tablet Place 1 tablet (0.4 mg total) under the tongue every 5  (five) minutes as needed for chest pain. 30 tablet 1  . omeprazole (PRILOSEC) 20 MG capsule Take 20 mg by mouth daily.     . ondansetron (ZOFRAN-ODT) 4 MG disintegrating tablet Take 4 mg by mouth 2 (two) times daily as needed for nausea or vomiting.    Marland Kitchen oxycodone (OXY-IR) 5 MG capsule Take 1 capsule (5 mg total) by mouth every 6 (six) hours as needed for pain. 50 capsule 0  . sevelamer carbonate (RENVELA) 800 MG tablet Take 800 mg by mouth 3 (three) times daily with meals.    Marland Kitchen  simvastatin (ZOCOR) 20 MG tablet TAKE ONE TABLET BY MOUTH AT BEDTIME 90 tablet 3  . ticagrelor (BRILINTA) 90 MG TABS tablet Take 90 mg by mouth 2 (two) times daily.    . hydrALAZINE (APRESOLINE) 50 MG tablet Take 2 tablets (100 mg total) by mouth 3 (three) times daily. (Patient taking differently: Take 100 mg by mouth 2 (two) times daily as needed (for high blood pressure readings). ) 540 tablet 3   No current facility-administered medications for this visit.     Allergies:   Aspirin; Penicillins; Amlodipine; Bactrim [sulfamethoxazole-trimethoprim]; Contrast media [iodinated diagnostic agents]; Iron; Nitrofurantoin; Tylenol [acetaminophen]; Gabapentin; Dexilant [dexlansoprazole]; Levaquin [levofloxacin in d5w]; Morphine and related; Plavix [clopidogrel bisulfate]; Protonix [pantoprazole sodium]; and Venofer [ferric oxide]    Social History:  The patient  reports that she has never smoked. She has never used smokeless tobacco. She reports that she does not drink alcohol or use drugs.   Family History:  The patient's family history includes Diabetes in her brother, father, mother, and sister; Heart attack in her brother, brother, and mother; Heart disease in her father and mother; Hyperlipidemia in her brother, father, and mother; Hypertension in her father, mother, sister, and sister; Other in her unknown relative.    ROS: All other systems are reviewed and negative. Unless otherwise mentioned in H&P    PHYSICAL  EXAM: VS:  BP 140/68   Pulse 67   Ht 5\' 2"  (1.575 m)   Wt 129 lb (58.5 kg)   SpO2 98%   BMI 23.59 kg/m  , BMI Body mass index is 23.59 kg/m. GEN: Well nourished, well developed, in no acute distress  HEENT: normal  Neck: no JVD, carotid bruits, or masses Cardiac:RRR; tachycardic,  no murmurs, rubs, or gallops,no edema  Respiratory:  clear to auscultation bilaterally, normal work of breathing GI: soft, nontender, nondistended, + BS MS: no deformity or atrophy dressing to left upper arm where new AV fistula has been placed. Dressing to right upper chest former dialysis catheter. Skin: warm and dry, no rash Neuro:  Strength and sensation are intact Psych: euthymic mood, full affect  Recent Labs: 05/19/2016: TSH 1.536 07/16/2016: B Natriuretic Peptide 372.0 2016/11/10: Magnesium 1.7 02/08/2017: ALT 10 02/09/2017: BUN 58; Creatinine, Ser 10.35; Platelets 208 02/24/2017: Hemoglobin 13.9; Potassium 5.4; Sodium 136    Lipid Panel    Component Value Date/Time   CHOL 106 02/08/2017 1205   TRIG 113 02/08/2017 1205   HDL 52 02/08/2017 1205   CHOLHDL 2.0 02/08/2017 1205   VLDL 23 02/08/2017 1205   LDLCALC 31 02/08/2017 1205      Wt Readings from Last 3 Encounters:  03/02/17 129 lb (58.5 kg)  02/22/17 128 lb (58.1 kg)  02/21/17 128 lb 6.4 oz (58.2 kg)      Other studies Reviewed: Echocardiogram Nov 10, 2016 Left ventricle: The cavity size was normal. There was moderate   concentric hypertrophy. Systolic function was normal. The   estimated ejection fraction was in the range of 50% to 55%. Wall   motion was normal; there were no regional wall motion   abnormalities. Doppler parameters are consistent with abnormal   left ventricular relaxation (grade 1 diastolic dysfunction).   Doppler parameters are consistent with elevated ventricular   end-diastolic filling pressure. - Aortic valve: Trileaflet; normal thickness leaflets. There was no   regurgitation. - Aortic root: The aortic root  was normal in size. - Mitral valve: Calcified annulus. Mildly thickened leaflets .   There was mild regurgitation. - Left atrium:  The atrium was normal in size. - Right ventricle: Systolic function was normal. - Tricuspid valve: There was no regurgitation. - Pulmonary arteries: Systolic pressure could not be accurately   estimated. - Inferior vena cava: The vessel was normal in size. - Pericardium, extracardiac: There was no pericardial effusion.  Cardiac Cath 10/13/2016 Conclusion     Ramus lesion, 90 %stenosed.  Dist LAD lesion, 80 %stenosed.  LIMA.  SVG.  Ost RCA to Prox RCA lesion, 100 %stenosed.  SVG.  Ost Cx to Prox Cx lesion, 90 %stenosed.  Mid LAD lesion, 75 %stenosed.  A STENT RESOLUTE ONYX 5.80D98 drug eluting stent was successfully placed, and overlaps previously placed stent.  Ost 1st Diag to 1st Diag lesion, 99 %stenosed.  Post intervention, there is a 0% residual stenosis.  1st Diag lesion, 20 %stenosed.   Severe multivessel native CAD with 99.9% ostial proximal diagonal 1 stenosis in a vessel which had been previously stented and now has TIMI 1 flow, 85% LAD stenosis immediately after the takeoff of this first diagonal vessel and 80% mid LAD stenosis just proximal to the insertion of the LIMA graft.  Ramus intermediate 90% ostial stenosis with competitive filling due a patent SVG.  Ostial Left circumflex 90% in-stent restenosis and occlusion of a marginal branch beyond the stented segment.  Total occlusion of a stent at the ostium of the RCA.  Patent LIMA graft supplying the mid distal LAD with insertion beyond the mid 80% stenosis.  Patent SVG supplying the ramus intermediate vessel with 30% narrowing just beyond the anastomosis.  Patent SVG supplying the distal RCA which fills the entire RCA distally and antegrade up to the occluded ostial stent.  Successful percutaneous coronary intervention to the subtotally occluded diagonal vessel with  the 99.9%  stenosis being reduced to 0% with PTCA and insertion of a 2.2515 mm Resolute Onyx DES stent into the previously placed stent post dilated to 2.45 mm and with the stenosis being reduced to 0% in the entire stented segment.  The sluggish TIMI-1 flow at the start of the procedure was improved to brisk TIMI-3 flow in the diagonal vessel following PCI. The LAD stenosis immediately after the diagonal vessel was 80% stenosed at the completion of the procedure.  RECOMMENDATION: Brillinta was used for antiplatelet therapy during this procedure due to the patient's reported rash to Plavix.  She will need to continue dual antiplatelet therapy.  Medical therapy for concomitant CAD.      ASSESSMENT AND PLAN:  1. CAD: Status post catheterization in April 2018 requiring overlapping drug-eluting stents to the diagonal branch, with patent grafts to the mid LAD, intermediate, and distal RCA. She is tolerating Brilinta despite having some bruising which she is uncomfortable with. She denies any active bleeding. She is a little tachycardic today. She remains on amiodarone, carvedilol 6.25 mg twice a day, and statin therapy. Blood pressures a little elevated today but normal at home. Sometimes she states that she does not take hydralazine because her blood pressure is low. We'll continue her on her current medication regimen.   labs are being checked by dialysis Center. She is due for dialysis tomorrow  2. Hypertension: Blood pressure is elevated slightly today at 140/68. She will continue her current medication regimen as directed. Will not make any changes at this time.  3. End-stage renal disease: Continue dialysis 3 times a week as directed. She has new AV fistula placement in the left upper arm. Dressing over the right upper chest, no evidence of  bleeding.  4. Carotid artery disease: I am not hearing any bruits on examination. I would like to repeat her Doppler ultrasound of carotids how her she would  like to wait to have this completed at this time.  5. Hypercholesterolemia: Remains on statin therapy.   Current medicines are reviewed at length with the patient today.    Labs/ tests ordered today include:  Phill Myron. [place] Pugh, ANP, AACC   03/02/2017 3:52 PM    [facility] [address], [place], [place] [address] Phone: [phone]; Fax: [phone]

## 2017-03-03 ENCOUNTER — Telehealth (INDEPENDENT_AMBULATORY_CARE_PROVIDER_SITE_OTHER): Payer: Self-pay | Admitting: *Deleted

## 2017-03-03 NOTE — Telephone Encounter (Signed)
   Diagnosis:    Result(s)   Card 1: Negative:            Completed by: Thomas Hoff , LPN   HEMOCCULT SENSA DEVELOPER: LOT#:  [id number] EXPIRATION DATE: 2020-10   HEMOCCULT SENSA CARD:  LOT#:  [id number] EXPIRATION DATE: 03/20   CARD CONTROL RESULTS:  POSITIVE: Positive NEGATIVE: Negative    ADDITIONAL COMMENTS: Patient was called and made aware of her result.

## 2017-03-05 NOTE — Telephone Encounter (Signed)
Stool is guaiac negative. 

## 2017-03-06 DIAGNOSIS — N186 End stage renal disease: Secondary | ICD-10-CM | POA: Diagnosis not present

## 2017-03-06 DIAGNOSIS — Z992 Dependence on renal dialysis: Secondary | ICD-10-CM | POA: Diagnosis not present

## 2017-03-07 DIAGNOSIS — Z6825 Body mass index (BMI) 25.0-25.9, adult: Secondary | ICD-10-CM | POA: Diagnosis not present

## 2017-03-07 DIAGNOSIS — J0100 Acute maxillary sinusitis, unspecified: Secondary | ICD-10-CM | POA: Diagnosis not present

## 2017-03-09 ENCOUNTER — Encounter (INDEPENDENT_AMBULATORY_CARE_PROVIDER_SITE_OTHER): Payer: Medicare HMO

## 2017-03-10 DIAGNOSIS — N186 End stage renal disease: Secondary | ICD-10-CM | POA: Diagnosis not present

## 2017-03-10 DIAGNOSIS — Z992 Dependence on renal dialysis: Secondary | ICD-10-CM | POA: Diagnosis not present

## 2017-03-11 DIAGNOSIS — Z992 Dependence on renal dialysis: Secondary | ICD-10-CM | POA: Diagnosis not present

## 2017-03-11 DIAGNOSIS — E8770 Fluid overload, unspecified: Secondary | ICD-10-CM | POA: Diagnosis not present

## 2017-03-11 DIAGNOSIS — E8779 Other fluid overload: Secondary | ICD-10-CM | POA: Diagnosis not present

## 2017-03-11 DIAGNOSIS — N186 End stage renal disease: Secondary | ICD-10-CM | POA: Diagnosis not present

## 2017-03-13 DIAGNOSIS — Z992 Dependence on renal dialysis: Secondary | ICD-10-CM | POA: Diagnosis not present

## 2017-03-13 DIAGNOSIS — N186 End stage renal disease: Secondary | ICD-10-CM | POA: Diagnosis not present

## 2017-03-13 DIAGNOSIS — Z23 Encounter for immunization: Secondary | ICD-10-CM | POA: Diagnosis not present

## 2017-03-14 ENCOUNTER — Other Ambulatory Visit (INDEPENDENT_AMBULATORY_CARE_PROVIDER_SITE_OTHER): Payer: Self-pay | Admitting: Vascular Surgery

## 2017-03-14 DIAGNOSIS — N185 Chronic kidney disease, stage 5: Secondary | ICD-10-CM

## 2017-03-15 DIAGNOSIS — N186 End stage renal disease: Secondary | ICD-10-CM | POA: Diagnosis not present

## 2017-03-15 DIAGNOSIS — Z992 Dependence on renal dialysis: Secondary | ICD-10-CM | POA: Diagnosis not present

## 2017-03-15 DIAGNOSIS — Z23 Encounter for immunization: Secondary | ICD-10-CM | POA: Diagnosis not present

## 2017-03-16 ENCOUNTER — Ambulatory Visit (INDEPENDENT_AMBULATORY_CARE_PROVIDER_SITE_OTHER): Payer: Medicare HMO | Admitting: Vascular Surgery

## 2017-03-16 ENCOUNTER — Ambulatory Visit (INDEPENDENT_AMBULATORY_CARE_PROVIDER_SITE_OTHER): Payer: Medicare HMO

## 2017-03-16 ENCOUNTER — Encounter (INDEPENDENT_AMBULATORY_CARE_PROVIDER_SITE_OTHER): Payer: Self-pay | Admitting: Vascular Surgery

## 2017-03-16 VITALS — BP 180/70 | HR 72 | Resp 16 | Ht 62.0 in | Wt 126.0 lb

## 2017-03-16 DIAGNOSIS — N185 Chronic kidney disease, stage 5: Secondary | ICD-10-CM

## 2017-03-16 DIAGNOSIS — T829XXS Unspecified complication of cardiac and vascular prosthetic device, implant and graft, sequela: Secondary | ICD-10-CM

## 2017-03-16 DIAGNOSIS — N186 End stage renal disease: Secondary | ICD-10-CM

## 2017-03-17 DIAGNOSIS — Z23 Encounter for immunization: Secondary | ICD-10-CM | POA: Diagnosis not present

## 2017-03-17 DIAGNOSIS — N186 End stage renal disease: Secondary | ICD-10-CM | POA: Diagnosis not present

## 2017-03-17 DIAGNOSIS — Z992 Dependence on renal dialysis: Secondary | ICD-10-CM | POA: Diagnosis not present

## 2017-03-19 DIAGNOSIS — T829XXA Unspecified complication of cardiac and vascular prosthetic device, implant and graft, initial encounter: Secondary | ICD-10-CM | POA: Insufficient documentation

## 2017-03-19 NOTE — Progress Notes (Signed)
Patient ID: Shawna [name], female   DOB: 12-Feb-1941, 76 y.o.   MRN: [id number]  Chief Complaint  Patient presents with  . Follow-up    2 wk [facility] with HDA    HPI Shawna [name] is a 76 y.o. female.  The patient sis s/p resection of AV fistula painful aneurysms with placement of a brachial axillary AV graft left arm.  She denies pain no hand symptoms no fever chills no wound drainage.   Past Medical History:  Diagnosis Date  . Acute on chronic respiratory failure with hypoxia ([place]) 10/10/2016  . Anxiety   . Arthritis   . AVM (arteriovenous malformation) of colon   . CAD (coronary artery disease)    a. 12/2011 NSTEMI/Cath/PCI LCX (2.25x14 Resolute DES) & D1 (2.25x22 Resolute DES);  b. 01/2012 Cath/PCI: LM 30, LAD 30p, 40-77m, D1 stent ok, 99 in sm branch of diag, LCX patent stent, OM1 20, RCA 95 ost (4.0x12 Promus DES), EF 55%;  c. 04/2012 Lexi Cardiolite  EF 48%, small area of scar @ base/mid inflat wall with mild peri-infarct ischemia.; CABG 12/4  . Carotid artery disease ([place])    a. 75-64% LICA, 09/3293   . Chronic bronchitis ([place])   . Chronic diastolic CHF (congestive heart failure) ([place])    a. 02/2012 Echo EF 60-65%, nl wall motion, Gr 1 DD, mod MR  . Colon cancer ([place]) 1992  . Esophageal stricture   . ESRD on hemodialysis ([place])    ESRD due to HTN, started dialysis 2011 and gets HD at [facility] with Dr Hinda Lenis on MWF schedule.  Access is LUA AVF as of Sept 2014.   Marland Kitchen GERD (gastroesophageal reflux disease)   . High cholesterol 12/2011  . History of blood transfusion 07/2011; 12/2011; 01/2012 X 2; 04/2012  . History of gout   . History of lower GI bleeding   . Hypertension   . Iron deficiency anemia   . Mitral regurgitation    a. Moderate by echo, 02/2012  . Myocardial infarction ([place])   . Ovarian cancer ([place]) 1992  . Pneumonia ~ 2009  . PUD (peptic ulcer disease)   . TIA (transient ischemic attack)     Past Surgical History:  Procedure Laterality Date  . ABDOMINAL  HYSTERECTOMY  1992  . APPENDECTOMY  06/1990  . AV FISTULA PLACEMENT  07/2009   left upper arm  . AV FISTULA PLACEMENT Right 09/06/2016   Procedure: RIGHT FOREARM ARTERIOVENOUS (AV) GRAFT;  Surgeon: Elam Dutch, MD;  Location: [facility] OR;  Service: Vascular;  Laterality: Right;  . AV FISTULA PLACEMENT N/A 02/24/2017   Procedure: INSERTION OF ARTERIOVENOUS (AV) GORE-TEX GRAFT ARM (BRACHIAL AXILLARY);  Surgeon: Katha Cabal, MD;  Location: [facility] ORS;  Service: Vascular;  Laterality: N/A;  . [place] Right 09/06/2016   Procedure: REMOVAL OF Right Arm ARTERIOVENOUS GORETEX GRAFT and Vein Patch angioplasty of brachial artery;  Surgeon: Angelia Mould, MD;  Location: [place];  Service: Vascular;  Laterality: Right;  . COLON RESECTION  1992  . COLON SURGERY    . CORONARY ANGIOPLASTY WITH STENT PLACEMENT  12/15/11   "2"  . CORONARY ANGIOPLASTY WITH STENT PLACEMENT  y/2013   "1; makes total of 3" (05/02/2012)  . CORONARY ARTERY BYPASS GRAFT  06/13/2012   Procedure: CORONARY ARTERY BYPASS GRAFTING (CABG);  Surgeon: Grace Isaac, MD;  Location: [place];  Service: Open Heart Surgery;  Laterality: N/A;  cabg x four;  using left internal mammary artery,  and left leg greater saphenous vein harvested endoscopically  . CORONARY STENT INTERVENTION N/A 10/13/2016   Procedure: Coronary Stent Intervention;  Surgeon: Troy Sine, MD;  Location: [place] CV LAB;  Service: Cardiovascular;  Laterality: N/A;  . DILATION AND CURETTAGE OF UTERUS    . ESOPHAGOGASTRODUODENOSCOPY  01/20/2012   Procedure: ESOPHAGOGASTRODUODENOSCOPY (EGD);  Surgeon: Ladene Artist, MD,FACG;  Location[facility] ENDOSCOPY;  Service: Endoscopy;  Laterality: N/A;  . ESOPHAGOGASTRODUODENOSCOPY N/A 03/26/2013   Procedure: ESOPHAGOGASTRODUODENOSCOPY (EGD);  Surgeon: Irene Shipper, MD;  Location: [facility] ENDOSCOPY;  Service: Endoscopy;  Laterality: N/A;  . ESOPHAGOGASTRODUODENOSCOPY N/A 04/30/2015   Procedure: ESOPHAGOGASTRODUODENOSCOPY (EGD);   Surgeon: Rogene Houston, MD;  [facility];  Service: Endoscopy;  Laterality: N/A;  1pm - moved to 10/20 @ 1:10  . ESOPHAGOGASTRODUODENOSCOPY N/A 07/29/2016   Procedure: ESOPHAGOGASTRODUODENOSCOPY (EGD);  Surgeon: Manus Gunning, MD;  Location: [place];  Service: Gastroenterology;  Laterality: N/A;  enteroscopy  . INTRAOPERATIVE TRANSESOPHAGEAL ECHOCARDIOGRAM  06/13/2012   Procedure: INTRAOPERATIVE TRANSESOPHAGEAL ECHOCARDIOGRAM;  Surgeon: Grace Isaac, MD;  Location: [place];  Service: Open Heart Surgery;  Laterality: N/A;  . IR GENERIC HISTORICAL  07/26/2016   IR FLUORO GUIDE CV LINE RIGHT 07/26/2016 Greggory Keen, MD [facility]-INTER[facility] RAD  . IR GENERIC HISTORICAL  07/26/2016   IR US GUIDE VASC ACCESS RIGHT 07/26/2016 Greggory Keen, MD [facility]  . IR GENERIC HISTORICAL  08/02/2016   IR US GUIDE VASC ACCESS RIGHT 08/02/2016 Greggory Keen, MD [facility]  . IR GENERIC HISTORICAL  08/02/2016   IR FLUORO GUIDE CV LINE RIGHT 08/02/2016 Greggory Keen, MD [facility]  . LEFT HEART CATH AND CORONARY ANGIOGRAPHY N/A 09/20/2016   Procedure: Left Heart Cath and Coronary Angiography;  Surgeon: [name], MD;  Location: [place] CV LAB;  Service: Cardiovascular;  Laterality: N/A;  . LEFT HEART CATH AND CORS/GRAFTS ANGIOGRAPHY N/A 10/13/2016   Procedure: Left Heart Cath and Cors/Grafts Angiography;  Surgeon: Troy Sine, MD;  Location: [place] CV LAB;  Service: Cardiovascular;  Laterality: N/A;  . LEFT HEART CATHETERIZATION WITH CORONARY ANGIOGRAM N/A 12/15/2011   Procedure: LEFT HEART CATHETERIZATION WITH CORONARY ANGIOGRAM;  Surgeon: Burnell Blanks, MD;  Location: [facility] CATH LAB;  Service: Cardiovascular;  Laterality: N/A;  . LEFT HEART CATHETERIZATION WITH CORONARY ANGIOGRAM N/A 01/10/2012   Procedure: LEFT HEART CATHETERIZATION WITH CORONARY ANGIOGRAM;  Surgeon: Peter M Martinique, MD;  Location: [facility] CATH LAB;  Service: Cardiovascular;  Laterality: N/A;  . LEFT HEART  CATHETERIZATION WITH CORONARY ANGIOGRAM N/A 06/08/2012   Procedure: LEFT HEART CATHETERIZATION WITH CORONARY ANGIOGRAM;  Surgeon: Burnell Blanks, MD;  Location: [facility] CATH LAB;  Service: Cardiovascular;  Laterality: N/A;  . LEFT HEART CATHETERIZATION WITH CORONARY/GRAFT ANGIOGRAM N/A 12/10/2013   Procedure: LEFT HEART CATHETERIZATION WITH Beatrix Fetters;  Surgeon: [name], MD;  Location: [facility] CATH LAB;  Service: Cardiovascular;  Laterality: N/A;  . OVARY SURGERY     ovarian cancer  . REVISION OF ARTERIOVENOUS GORETEX GRAFT N/A 02/24/2017   Procedure: REVISION OF ARTERIOVENOUS GORETEX GRAFT (RESECTION);  Surgeon: Katha Cabal, MD;  Location: [facility] ORS;  Service: Vascular;  Laterality: N/A;  Earney Mal[place] N/A 10/15/2013   Procedure: Fistulogram;  Surgeon: Serafina Mitchell, MD;  Location: [facility] CATH LAB;  Service: Cardiovascular;  Laterality: N/A;  . THROMBECTOMY / ARTERIOVENOUS GRAFT REVISION  2011   left upper arm  . TUBAL LIGATION  1980's  . UPPER EXTREMITY ANGIOGRAPHY Bilateral 12/06/2016   Procedure: Upper Extremity  Angiography;  Surgeon: Katha Cabal, MD;  Location: [place] CV LAB;  Service: Cardiovascular;  Laterality: Bilateral;      Allergies  Allergen Reactions  . Aspirin Other (See Comments)    High Doses Mess up her stomach; "makes my bowels have blood in them". Takes 81 mg EC Aspirin   . Penicillins Other (See Comments)    SYNCOPE? , "makes me real weak when I take it; like I'll pass out"  Has patient had a PCN reaction causing immediate rash, facial/tongue/throat swelling, SOB or lightheadedness with hypotension: Yes Has patient had a PCN reaction causing severe rash involving mucus membranes or skin necrosis: no Has patient had a PCN reaction that required hospitalization no Has patient had a PCN reaction occurring within the last 10 years: no If all of the above  . Amlodipine Swelling  . Bactrim [Sulfamethoxazole-Trimethoprim] Rash  .  Contrast Media [Iodinated Diagnostic Agents] Itching  . Iron Itching and Other (See Comments)    "they gave me iron in dialysis; had to give me Benadryl cause I had to have the iron" (05/02/2012)  . Nitrofurantoin Hives  . Tylenol [Acetaminophen] Itching and Other (See Comments)    Makes her feet on fire per pt  . Gabapentin Other (See Comments)    unspecified  . Dexilant [Dexlansoprazole] Other (See Comments)    Upset stomach  . Levaquin [Levofloxacin In D5w] Rash  . Morphine And Related Itching    Itching in feet  . Plavix [Clopidogrel Bisulfate] Rash  . Protonix [Pantoprazole Sodium] Rash  . Venofer [Ferric Oxide] Itching    Patient reports using Benadryl prior to doses as [facility]    Current Outpatient Prescriptions  Medication Sig Dispense Refill  . ALPRAZolam (XANAX) 0.25 MG tablet Take 0.25 mg by mouth 2 (two) times daily as needed for anxiety or sleep.     Marland Kitchen amiodarone (PACERONE) 200 MG tablet Take 1 tablet (200 mg total) by mouth daily. 30 tablet 3  . aspirin EC 81 MG tablet Take 81 mg by mouth daily.     . carvedilol (COREG) 6.25 MG tablet Take 1 tablet (6.25 mg total) by mouth 2 (two) times daily with a meal. 180 tablet 1  . cinacalcet (SENSIPAR) 30 MG tablet Take 30 mg by mouth daily after supper.    . cloNIDine (CATAPRES) 0.2 MG tablet Take 1 tablet (0.2 mg total) by mouth 3 (three) times daily. (Patient taking differently: Take 0.2 mg by mouth 2 (two) times daily as needed (high blood pressure). ) 90 tablet 3  . diphenhydrAMINE (BENADRYL) 25 mg capsule Take 50 mg by mouth daily as needed for itching or allergies.     Marland Kitchen epoetin alfa (EPOGEN,PROCRIT) 16109 UNIT/ML injection Inject 1 mL (10,000 Units total) into the vein every Monday, Wednesday, and Friday with hemodialysis. 1 mL 10  . fluticasone (FLONASE) 50 MCG/ACT nasal spray Place 2 sprays into the nose at bedtime as needed for allergies.     . isosorbide mononitrate (IMDUR) 120 MG 24 hr tablet Take 1 tablet (120  mg total) by mouth daily. 30 tablet 2  . loratadine (CLARITIN) 10 MG tablet Take 10 mg by mouth daily as needed for allergies.     . multivitamin (RENA-VIT) TABS tablet Take 1 tablet by mouth daily.    . nitroGLYCERIN (NITROSTAT) 0.4 MG SL tablet Place 1 tablet (0.4 mg total) under the tongue every 5 (five) minutes as needed for chest pain. 30 tablet 1  . omeprazole (PRILOSEC)  20 MG capsule Take 20 mg by mouth daily.     . ondansetron (ZOFRAN-ODT) 4 MG disintegrating tablet Take 4 mg by mouth 2 (two) times daily as needed for nausea or vomiting.    Marland Kitchen oxycodone (OXY-IR) 5 MG capsule Take 1 capsule (5 mg total) by mouth every 6 (six) hours as needed for pain. 50 capsule 0  . sevelamer carbonate (RENVELA) 800 MG tablet Take 800 mg by mouth 3 (three) times daily with meals.    . simvastatin (ZOCOR) 20 MG tablet TAKE ONE TABLET BY MOUTH AT BEDTIME 90 tablet 3  . ticagrelor (BRILINTA) 90 MG TABS tablet Take 90 mg by mouth 2 (two) times daily.    . hydrALAZINE (APRESOLINE) 50 MG tablet Take 2 tablets (100 mg total) by mouth 3 (three) times daily. (Patient taking differently: Take 100 mg by mouth 2 (two) times daily as needed (for high blood pressure readings). ) 540 tablet 3   No current facility-administered medications for this visit.         Physical Exam BP (!) 180/70 (BP Location: Right Arm)   Pulse 72   Resp 16   Ht 5\' 2"  (1.575 m)   Wt 57.2 kg (126 lb)   BMI 23.05 kg/m  Gen:  WD/WN, NAD Skin: incision C/D/I  Still with some edema over the graft AV graft good thrill good bruit     Assessment/Plan: 1. ESRD (end stage renal disease) ([place]) Recommend:  The patient is doing well and currently has adequate dialysis access. The patient's dialysis center is not reporting any access issues. Flow pattern is stable when compared to the prior ultrasound.  The patient should have a duplex ultrasound of the dialysis access in 3 months. The patient will follow-up with me in the office  after each ultrasound      2. Complication of vascular access for dialysis, sequela Recommend:  The patient is doing well and currently has adequate dialysis access. The patient's dialysis center is not reporting any access issues. Flow pattern is stable when compared to the prior ultrasound.  The patient should have a duplex ultrasound of the dialysis access in 3 months. The patient will follow-up with me in the office after each ultrasound    - VAS [place] (AVF,AVG); Future      Hortencia Pilar 03/19/2017, 4:03 PM   This note was created with Dragon medical transcription system.  Any errors from dictation are unintentional.

## 2017-03-20 DIAGNOSIS — Z23 Encounter for immunization: Secondary | ICD-10-CM | POA: Diagnosis not present

## 2017-03-20 DIAGNOSIS — Z992 Dependence on renal dialysis: Secondary | ICD-10-CM | POA: Diagnosis not present

## 2017-03-20 DIAGNOSIS — N186 End stage renal disease: Secondary | ICD-10-CM | POA: Diagnosis not present

## 2017-03-22 ENCOUNTER — Emergency Department (HOSPITAL_COMMUNITY): Payer: Medicare HMO

## 2017-03-22 ENCOUNTER — Encounter (HOSPITAL_COMMUNITY): Payer: Self-pay | Admitting: Emergency Medicine

## 2017-03-22 ENCOUNTER — Inpatient Hospital Stay (HOSPITAL_COMMUNITY)
Admission: EM | Admit: 2017-03-22 | Discharge: 2017-04-05 | DRG: 391 | Disposition: A | Discharge status: Skilled Nursing Facility | Payer: Medicare HMO | Attending: Internal Medicine | Admitting: Internal Medicine

## 2017-03-22 ENCOUNTER — Inpatient Hospital Stay (HOSPITAL_COMMUNITY): Payer: Medicare HMO

## 2017-03-22 DIAGNOSIS — Z8711 Personal history of peptic ulcer disease: Secondary | ICD-10-CM

## 2017-03-22 DIAGNOSIS — N2581 Secondary hyperparathyroidism of renal origin: Secondary | ICD-10-CM | POA: Diagnosis present

## 2017-03-22 DIAGNOSIS — K219 Gastro-esophageal reflux disease without esophagitis: Secondary | ICD-10-CM | POA: Diagnosis present

## 2017-03-22 DIAGNOSIS — D631 Anemia in chronic kidney disease: Secondary | ICD-10-CM | POA: Diagnosis not present

## 2017-03-22 DIAGNOSIS — I5032 Chronic diastolic (congestive) heart failure: Secondary | ICD-10-CM | POA: Diagnosis present

## 2017-03-22 DIAGNOSIS — I480 Paroxysmal atrial fibrillation: Secondary | ICD-10-CM | POA: Diagnosis not present

## 2017-03-22 DIAGNOSIS — R52 Pain, unspecified: Secondary | ICD-10-CM | POA: Diagnosis not present

## 2017-03-22 DIAGNOSIS — T380X5A Adverse effect of glucocorticoids and synthetic analogues, initial encounter: Secondary | ICD-10-CM | POA: Diagnosis not present

## 2017-03-22 DIAGNOSIS — E7800 Pure hypercholesterolemia, unspecified: Secondary | ICD-10-CM | POA: Diagnosis present

## 2017-03-22 DIAGNOSIS — I959 Hypotension, unspecified: Secondary | ICD-10-CM | POA: Diagnosis not present

## 2017-03-22 DIAGNOSIS — R1030 Lower abdominal pain, unspecified: Secondary | ICD-10-CM | POA: Diagnosis present

## 2017-03-22 DIAGNOSIS — I10 Essential (primary) hypertension: Secondary | ICD-10-CM | POA: Diagnosis not present

## 2017-03-22 DIAGNOSIS — Y92230 Patient room in hospital as the place of occurrence of the external cause: Secondary | ICD-10-CM | POA: Diagnosis not present

## 2017-03-22 DIAGNOSIS — K529 Noninfective gastroenteritis and colitis, unspecified: Secondary | ICD-10-CM | POA: Diagnosis not present

## 2017-03-22 DIAGNOSIS — Z91041 Radiographic dye allergy status: Secondary | ICD-10-CM

## 2017-03-22 DIAGNOSIS — K651 Peritoneal abscess: Secondary | ICD-10-CM | POA: Diagnosis not present

## 2017-03-22 DIAGNOSIS — R0789 Other chest pain: Secondary | ICD-10-CM | POA: Diagnosis not present

## 2017-03-22 DIAGNOSIS — I252 Old myocardial infarction: Secondary | ICD-10-CM | POA: Diagnosis not present

## 2017-03-22 DIAGNOSIS — R079 Chest pain, unspecified: Secondary | ICD-10-CM | POA: Diagnosis not present

## 2017-03-22 DIAGNOSIS — K5792 Diverticulitis of intestine, part unspecified, without perforation or abscess without bleeding: Secondary | ICD-10-CM

## 2017-03-22 DIAGNOSIS — F419 Anxiety disorder, unspecified: Secondary | ICD-10-CM | POA: Diagnosis present

## 2017-03-22 DIAGNOSIS — K5520 Angiodysplasia of colon without hemorrhage: Secondary | ICD-10-CM | POA: Diagnosis present

## 2017-03-22 DIAGNOSIS — K5700 Diverticulitis of small intestine with perforation and abscess without bleeding: Secondary | ICD-10-CM | POA: Diagnosis present

## 2017-03-22 DIAGNOSIS — J9611 Chronic respiratory failure with hypoxia: Secondary | ICD-10-CM | POA: Diagnosis not present

## 2017-03-22 DIAGNOSIS — Z833 Family history of diabetes mellitus: Secondary | ICD-10-CM

## 2017-03-22 DIAGNOSIS — I132 Hypertensive heart and chronic kidney disease with heart failure and with stage 5 chronic kidney disease, or end stage renal disease: Secondary | ICD-10-CM | POA: Diagnosis not present

## 2017-03-22 DIAGNOSIS — R109 Unspecified abdominal pain: Secondary | ICD-10-CM | POA: Diagnosis not present

## 2017-03-22 DIAGNOSIS — Z951 Presence of aortocoronary bypass graft: Secondary | ICD-10-CM

## 2017-03-22 DIAGNOSIS — K5649 Other impaction of intestine: Secondary | ICD-10-CM | POA: Diagnosis not present

## 2017-03-22 DIAGNOSIS — I2510 Atherosclerotic heart disease of native coronary artery without angina pectoris: Secondary | ICD-10-CM | POA: Diagnosis not present

## 2017-03-22 DIAGNOSIS — R0602 Shortness of breath: Secondary | ICD-10-CM | POA: Diagnosis not present

## 2017-03-22 DIAGNOSIS — N2889 Other specified disorders of kidney and ureter: Secondary | ICD-10-CM | POA: Diagnosis present

## 2017-03-22 DIAGNOSIS — Z79899 Other long term (current) drug therapy: Secondary | ICD-10-CM

## 2017-03-22 DIAGNOSIS — E162 Hypoglycemia, unspecified: Secondary | ICD-10-CM | POA: Diagnosis not present

## 2017-03-22 DIAGNOSIS — K559 Vascular disorder of intestine, unspecified: Secondary | ICD-10-CM

## 2017-03-22 DIAGNOSIS — E785 Hyperlipidemia, unspecified: Secondary | ICD-10-CM | POA: Diagnosis not present

## 2017-03-22 DIAGNOSIS — N186 End stage renal disease: Secondary | ICD-10-CM | POA: Diagnosis present

## 2017-03-22 DIAGNOSIS — Z8673 Personal history of transient ischemic attack (TIA), and cerebral infarction without residual deficits: Secondary | ICD-10-CM

## 2017-03-22 DIAGNOSIS — K7689 Other specified diseases of liver: Secondary | ICD-10-CM | POA: Diagnosis not present

## 2017-03-22 DIAGNOSIS — Z8701 Personal history of pneumonia (recurrent): Secondary | ICD-10-CM

## 2017-03-22 DIAGNOSIS — R05 Cough: Secondary | ICD-10-CM | POA: Diagnosis not present

## 2017-03-22 DIAGNOSIS — K449 Diaphragmatic hernia without obstruction or gangrene: Secondary | ICD-10-CM | POA: Diagnosis present

## 2017-03-22 DIAGNOSIS — Z8543 Personal history of malignant neoplasm of ovary: Secondary | ICD-10-CM

## 2017-03-22 DIAGNOSIS — Z888 Allergy status to other drugs, medicaments and biological substances status: Secondary | ICD-10-CM

## 2017-03-22 DIAGNOSIS — E871 Hypo-osmolality and hyponatremia: Secondary | ICD-10-CM | POA: Diagnosis not present

## 2017-03-22 DIAGNOSIS — M79642 Pain in left hand: Secondary | ICD-10-CM | POA: Diagnosis not present

## 2017-03-22 DIAGNOSIS — Z881 Allergy status to other antibiotic agents status: Secondary | ICD-10-CM

## 2017-03-22 DIAGNOSIS — D649 Anemia, unspecified: Secondary | ICD-10-CM | POA: Diagnosis not present

## 2017-03-22 DIAGNOSIS — E875 Hyperkalemia: Secondary | ICD-10-CM | POA: Diagnosis not present

## 2017-03-22 DIAGNOSIS — I872 Venous insufficiency (chronic) (peripheral): Secondary | ICD-10-CM | POA: Diagnosis not present

## 2017-03-22 DIAGNOSIS — E8889 Other specified metabolic disorders: Secondary | ICD-10-CM | POA: Diagnosis present

## 2017-03-22 DIAGNOSIS — I8291 Chronic embolism and thrombosis of unspecified vein: Secondary | ICD-10-CM | POA: Diagnosis not present

## 2017-03-22 DIAGNOSIS — Z90710 Acquired absence of both cervix and uterus: Secondary | ICD-10-CM

## 2017-03-22 DIAGNOSIS — Z992 Dependence on renal dialysis: Secondary | ICD-10-CM | POA: Diagnosis not present

## 2017-03-22 DIAGNOSIS — I25118 Atherosclerotic heart disease of native coronary artery with other forms of angina pectoris: Secondary | ICD-10-CM | POA: Diagnosis present

## 2017-03-22 DIAGNOSIS — Z85038 Personal history of other malignant neoplasm of large intestine: Secondary | ICD-10-CM

## 2017-03-22 DIAGNOSIS — Z7982 Long term (current) use of aspirin: Secondary | ICD-10-CM

## 2017-03-22 DIAGNOSIS — K56609 Unspecified intestinal obstruction, unspecified as to partial versus complete obstruction: Secondary | ICD-10-CM | POA: Diagnosis present

## 2017-03-22 DIAGNOSIS — Z886 Allergy status to analgesic agent status: Secondary | ICD-10-CM

## 2017-03-22 DIAGNOSIS — K55059 Acute (reversible) ischemia of intestine, part and extent unspecified: Secondary | ICD-10-CM | POA: Diagnosis not present

## 2017-03-22 DIAGNOSIS — R410 Disorientation, unspecified: Secondary | ICD-10-CM | POA: Diagnosis not present

## 2017-03-22 DIAGNOSIS — K55019 Acute (reversible) ischemia of small intestine, extent unspecified: Secondary | ICD-10-CM | POA: Diagnosis not present

## 2017-03-22 DIAGNOSIS — Z8249 Family history of ischemic heart disease and other diseases of the circulatory system: Secondary | ICD-10-CM

## 2017-03-22 DIAGNOSIS — I340 Nonrheumatic mitral (valve) insufficiency: Secondary | ICD-10-CM | POA: Diagnosis present

## 2017-03-22 DIAGNOSIS — Z955 Presence of coronary angioplasty implant and graft: Secondary | ICD-10-CM

## 2017-03-22 DIAGNOSIS — I82722 Chronic embolism and thrombosis of deep veins of left upper extremity: Secondary | ICD-10-CM | POA: Diagnosis not present

## 2017-03-22 DIAGNOSIS — Z880 Allergy status to penicillin: Secondary | ICD-10-CM

## 2017-03-22 DIAGNOSIS — K5712 Diverticulitis of small intestine without perforation or abscess without bleeding: Secondary | ICD-10-CM | POA: Diagnosis not present

## 2017-03-22 DIAGNOSIS — Z882 Allergy status to sulfonamides status: Secondary | ICD-10-CM

## 2017-03-22 DIAGNOSIS — Z8349 Family history of other endocrine, nutritional and metabolic diseases: Secondary | ICD-10-CM

## 2017-03-22 DIAGNOSIS — I120 Hypertensive chronic kidney disease with stage 5 chronic kidney disease or end stage renal disease: Secondary | ICD-10-CM | POA: Diagnosis not present

## 2017-03-22 DIAGNOSIS — K5730 Diverticulosis of large intestine without perforation or abscess without bleeding: Secondary | ICD-10-CM | POA: Diagnosis not present

## 2017-03-22 DIAGNOSIS — I208 Other forms of angina pectoris: Secondary | ICD-10-CM | POA: Diagnosis not present

## 2017-03-22 DIAGNOSIS — M109 Gout, unspecified: Secondary | ICD-10-CM | POA: Diagnosis present

## 2017-03-22 DIAGNOSIS — R9431 Abnormal electrocardiogram [ECG] [EKG]: Secondary | ICD-10-CM | POA: Diagnosis not present

## 2017-03-22 DIAGNOSIS — Z885 Allergy status to narcotic agent status: Secondary | ICD-10-CM

## 2017-03-22 DIAGNOSIS — K56600 Partial intestinal obstruction, unspecified as to cause: Secondary | ICD-10-CM | POA: Diagnosis not present

## 2017-03-22 DIAGNOSIS — Z9049 Acquired absence of other specified parts of digestive tract: Secondary | ICD-10-CM

## 2017-03-22 LAB — CBC WITH DIFFERENTIAL/PLATELET
Basophils Absolute: 0 10*3/uL (ref 0.0–0.1)
Basophils Relative: 0 %
EOS ABS: 0.4 10*3/uL (ref 0.0–0.7)
Eosinophils Relative: 5 %
HEMATOCRIT: 42.8 % (ref 36.0–46.0)
HEMOGLOBIN: 13.5 g/dL (ref 12.0–15.0)
LYMPHS ABS: 0.6 10*3/uL — AB (ref 0.7–4.0)
Lymphocytes Relative: 7 %
MCH: 33.2 pg (ref 26.0–34.0)
MCHC: 31.5 g/dL (ref 30.0–36.0)
MCV: 105.2 fL — ABNORMAL HIGH (ref 78.0–100.0)
Monocytes Absolute: 0.4 10*3/uL (ref 0.1–1.0)
Monocytes Relative: 4 %
NEUTROS ABS: 7.2 10*3/uL (ref 1.7–7.7)
NEUTROS PCT: 84 %
Platelets: 260 10*3/uL (ref 150–400)
RBC: 4.07 MIL/uL (ref 3.87–5.11)
RDW: 18.1 % — ABNORMAL HIGH (ref 11.5–15.5)
WBC: 8.6 10*3/uL (ref 4.0–10.5)

## 2017-03-22 LAB — COMPREHENSIVE METABOLIC PANEL
ALT: 14 U/L (ref 14–54)
ANION GAP: 14 (ref 5–15)
AST: 19 U/L (ref 15–41)
Albumin: 4 g/dL (ref 3.5–5.0)
Alkaline Phosphatase: 104 U/L (ref 38–126)
BUN: 36 mg/dL — ABNORMAL HIGH (ref 6–20)
CHLORIDE: 99 mmol/L — AB (ref 101–111)
CO2: 25 mmol/L (ref 22–32)
CREATININE: 8.26 mg/dL — AB (ref 0.44–1.00)
Calcium: 8.5 mg/dL — ABNORMAL LOW (ref 8.9–10.3)
GFR, EST AFRICAN AMERICAN: 5 mL/min — AB (ref >60)
GFR, EST NON AFRICAN AMERICAN: 4 mL/min — AB (ref >60)
Glucose, Bld: 96 mg/dL (ref 65–99)
POTASSIUM: 5.2 mmol/L — AB (ref 3.5–5.1)
Sodium: 138 mmol/L (ref 135–145)
Total Bilirubin: 0.5 mg/dL (ref 0.3–1.2)
Total Protein: 7.4 g/dL (ref 6.5–8.1)

## 2017-03-22 LAB — LIPASE, BLOOD: LIPASE: 31 U/L (ref 11–51)

## 2017-03-22 LAB — I-STAT CG4 LACTIC ACID, ED: LACTIC ACID, VENOUS: 1.84 mmol/L (ref 0.5–1.9)

## 2017-03-22 LAB — MRSA PCR SCREENING: MRSA by PCR: NEGATIVE

## 2017-03-22 IMAGING — DX DG ABDOMEN ACUTE W/ 1V CHEST
3 series · 3 of 3 positions shown · non-contrast
Comparison: Radiograph [DATE].  CT [DATE]

CLINICAL DATA: Diffuse abdominal pain.

EXAM:
DG ABDOMEN ACUTE W/ 1V CHEST

[abdomen erect]
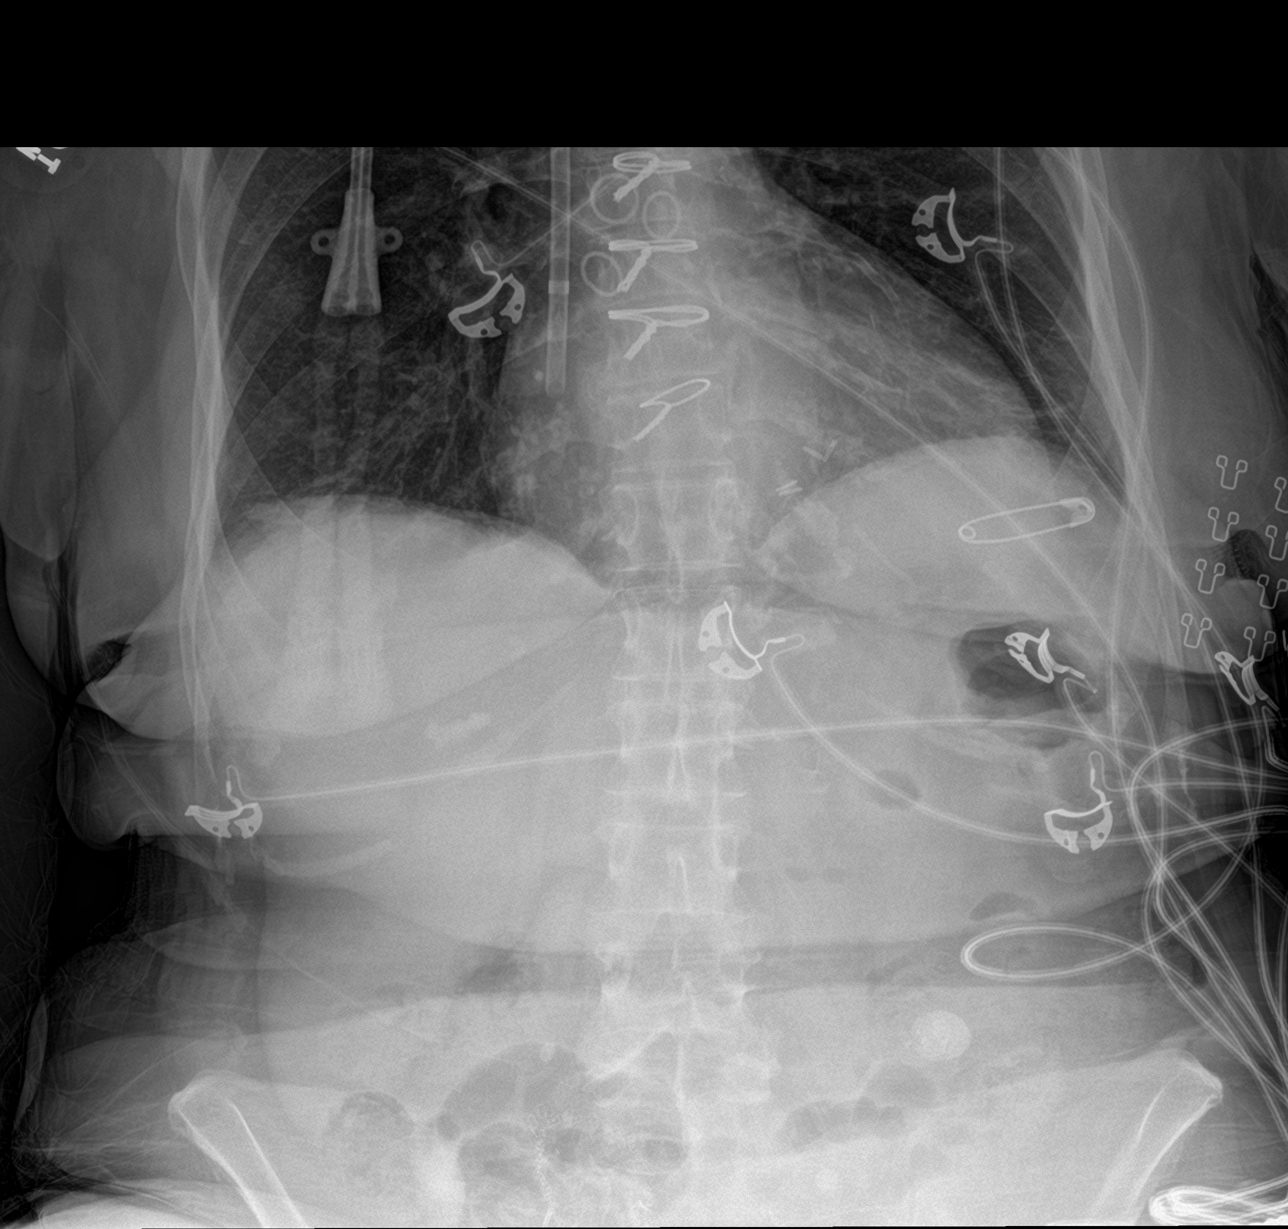

[abdomen supine]
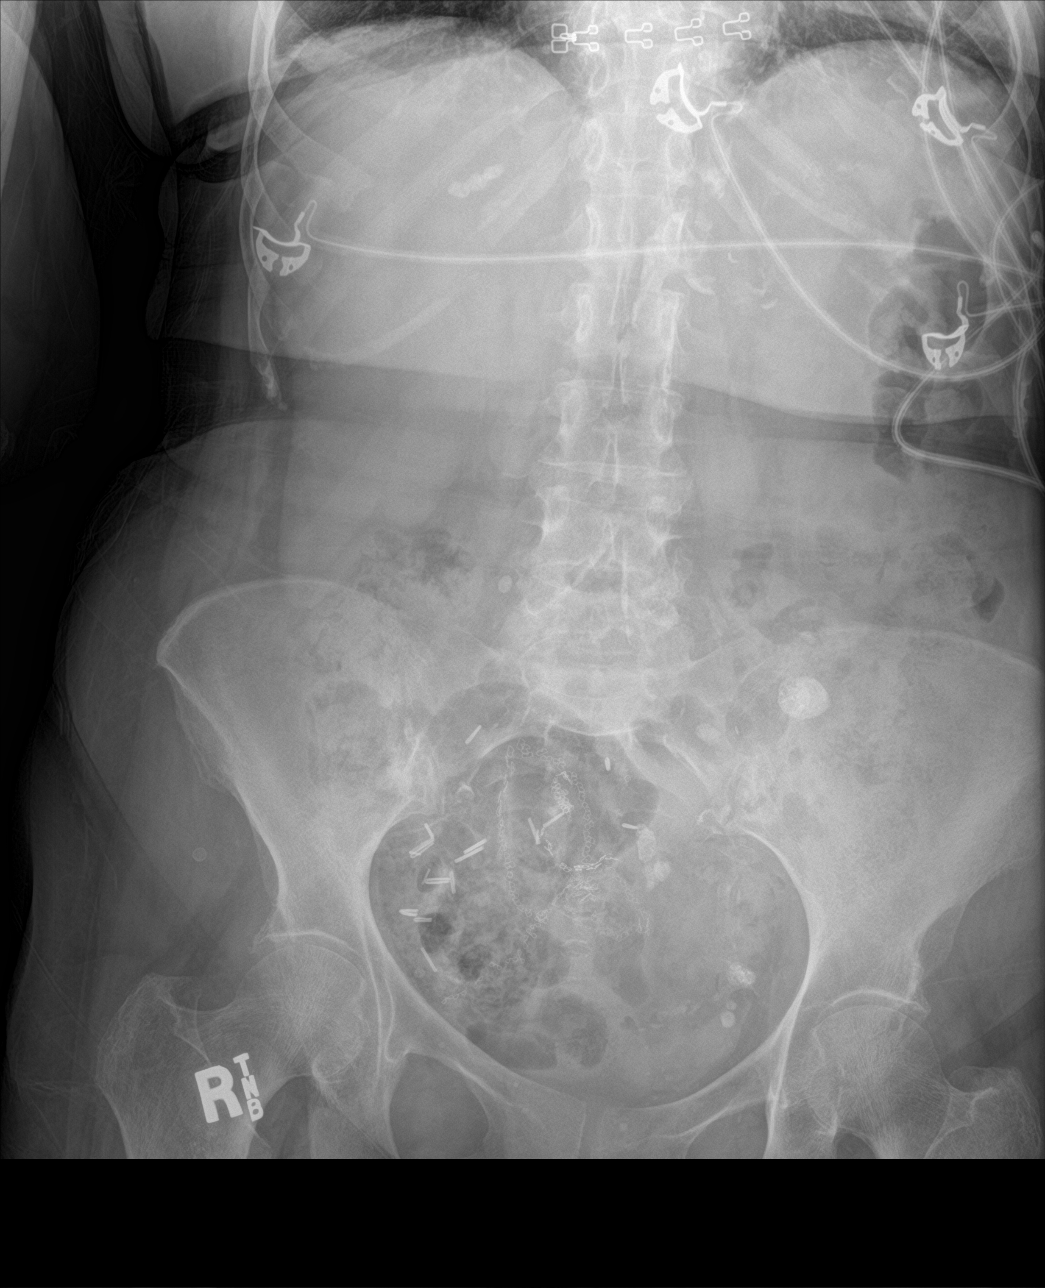

[chest ap]
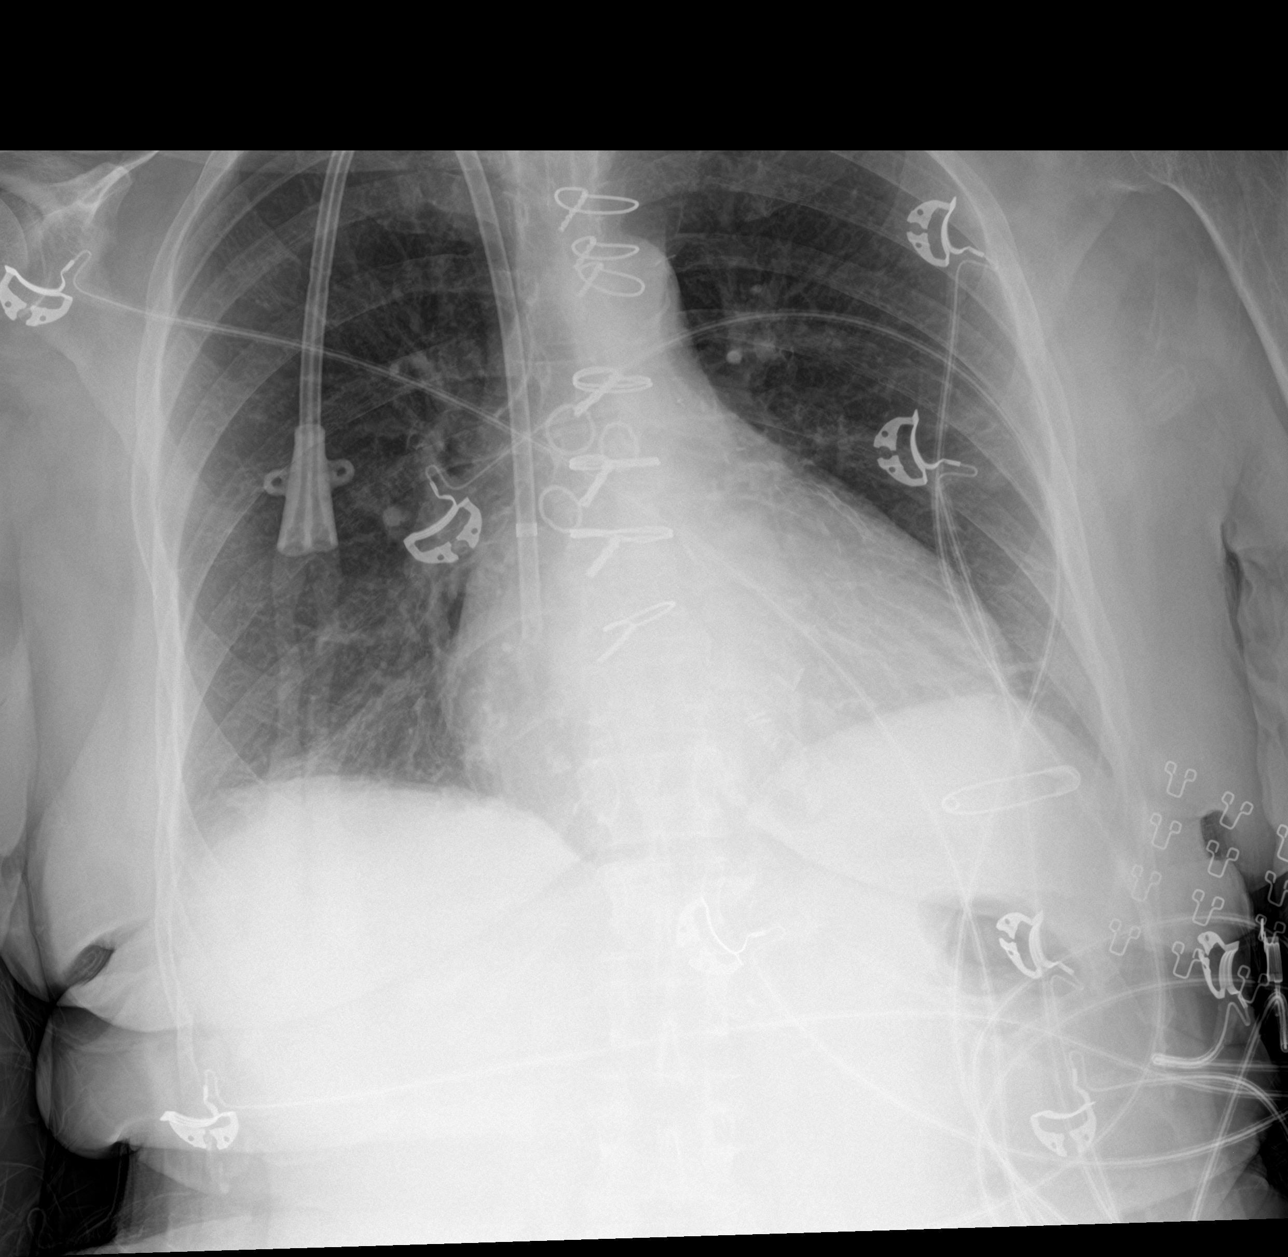

[3 of 3 positions shown; findings below may reference images not displayed]

FINDINGS: Right-sided dialysis catheter with tip in the distal SVC.
Cardiomegaly. Post median sternotomy. Bibasilar atelectasis, right
greater than left. No pulmonary edema or pleural fluid.

No free intra-abdominal air. No bowel dilatation to suggest
obstruction. Scattered retained barium within colonic diverticula.
Multiple enteric sutures and surgical clips in the pelvis. Small
volume of colonic stool.
IMPRESSION: 1. Normal bowel gas pattern. No free air. Retained barium within
colonic diverticulum.
2. Mild bibasilar atelectasis.  Mild cardiomegaly.

## 2017-03-22 IMAGING — CT CT ABD-PELV W/O CM
2 of 4 series · 16 of 46 positions shown, 18 images · non-contrast
Comparison: [DATE] CT of abdomen and pelvis.

CLINICAL DATA: 75 y/o F; generalized abdominal pain and abdominal
distention.

EXAM:
CT ABDOMEN AND PELVIS WITHOUT CONTRAST
TECHNIQUE: Multidetector CT imaging of the abdomen and pelvis was performed
following the standard protocol without IV contrast.

[Series 2: axial st · axial · 0.76mm/px · z∈[+813,+1148]mm · 13 of 75 slices shown, 15 images]
[im 4/75  soft-tissue]
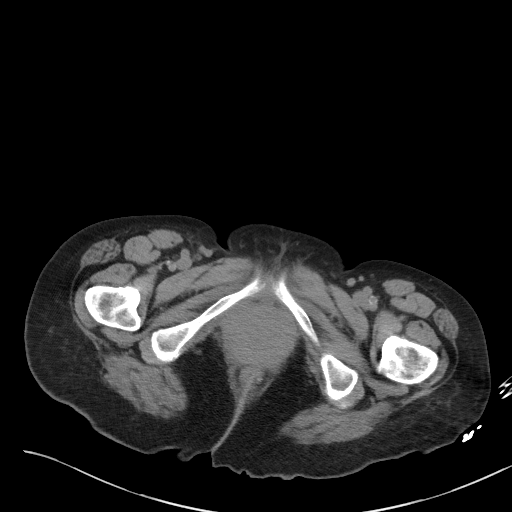
[im 4/75  bone]
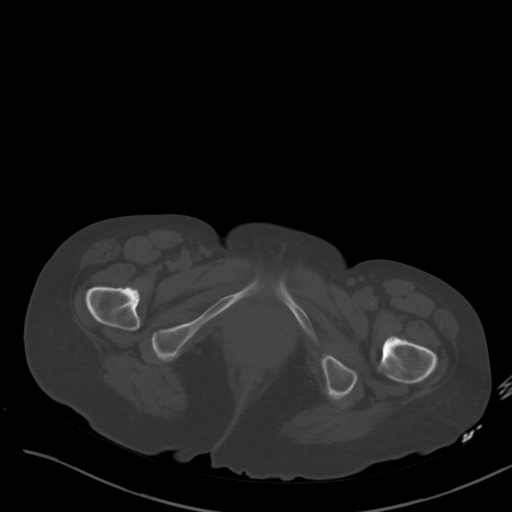
[im 10/75  soft-tissue]
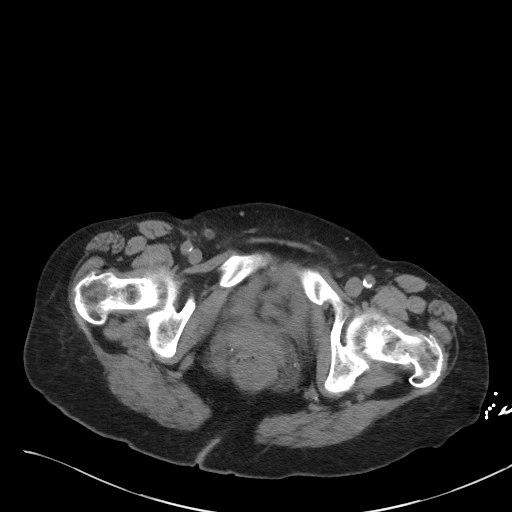
[im 17/75  soft-tissue]
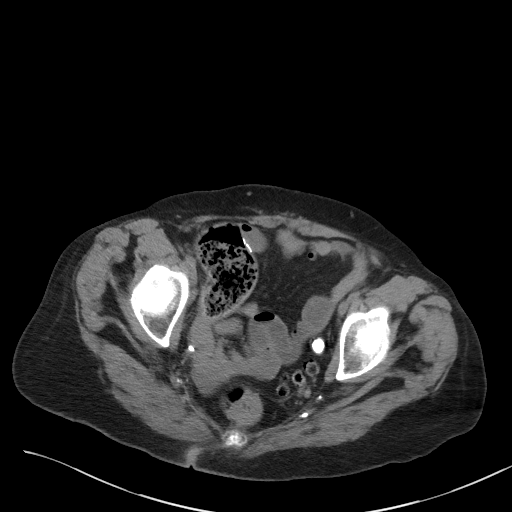
[im 20/75  soft-tissue]
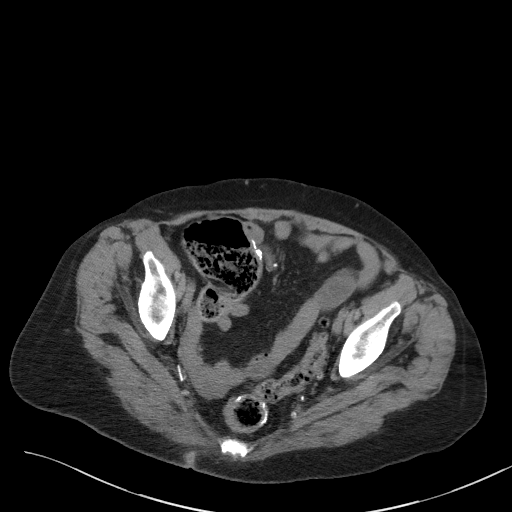
[im 26/75  soft-tissue]
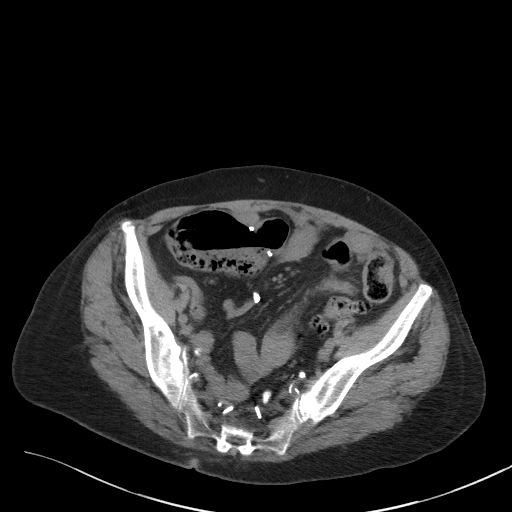
[im 33/75  soft-tissue]
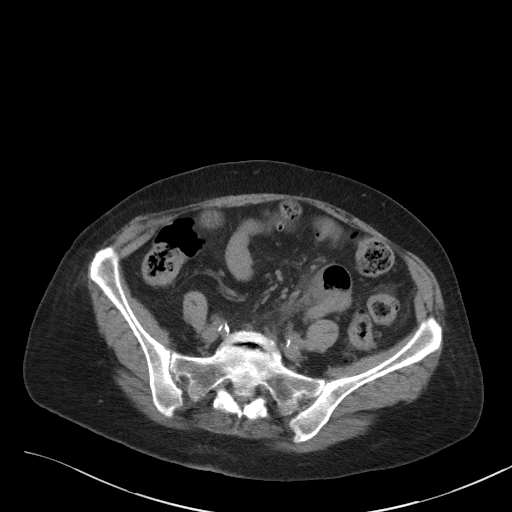
[im 39/75  soft-tissue]
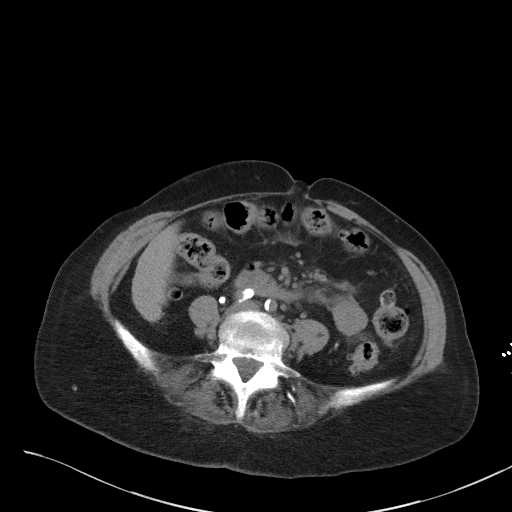
[im 42/75  soft-tissue]
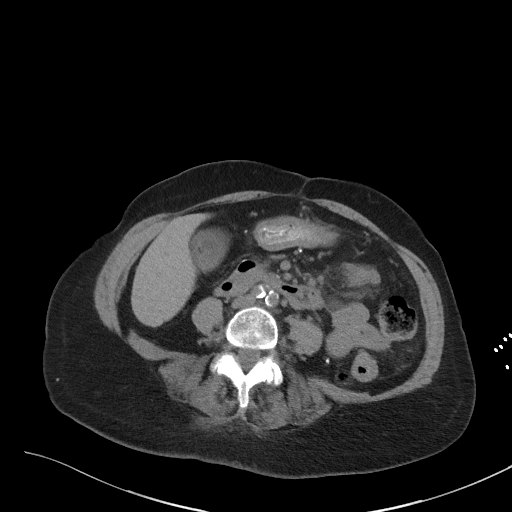
[im 49/75  soft-tissue]
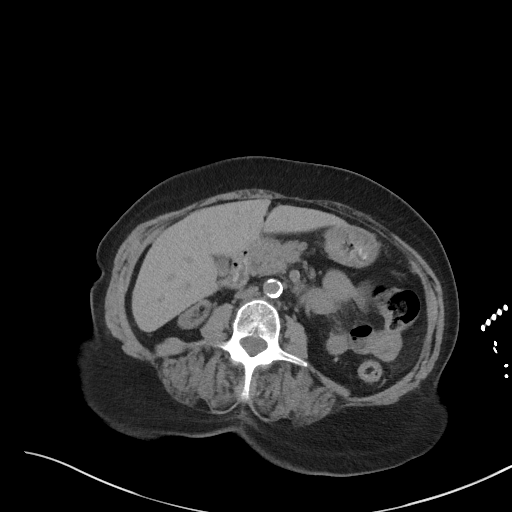
[im 49/75  bone]
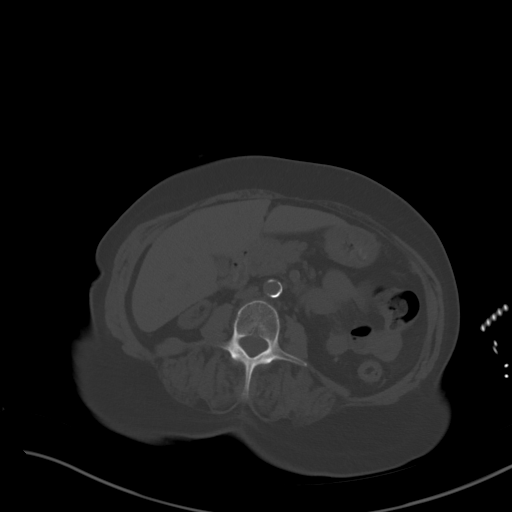
[im 55/75  soft-tissue]
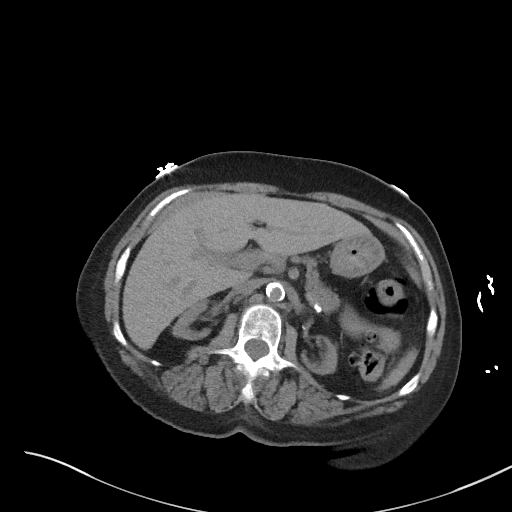
[im 58/75  soft-tissue]
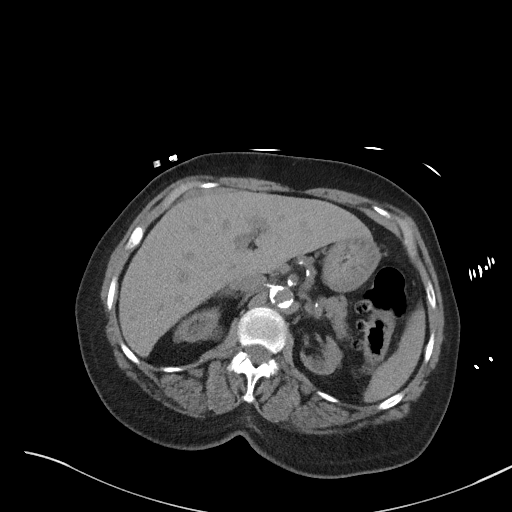
[im 65/75  soft-tissue]
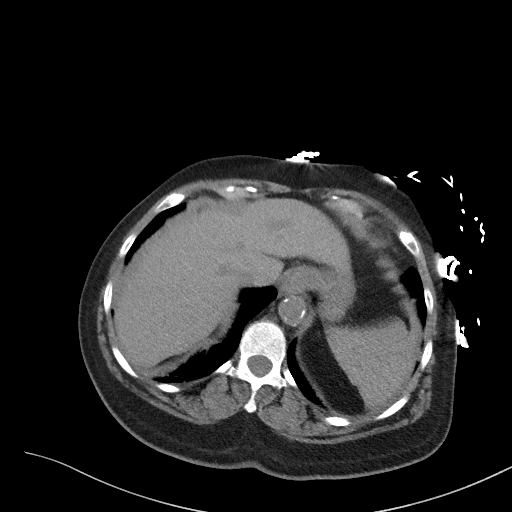
[im 71/75  soft-tissue]
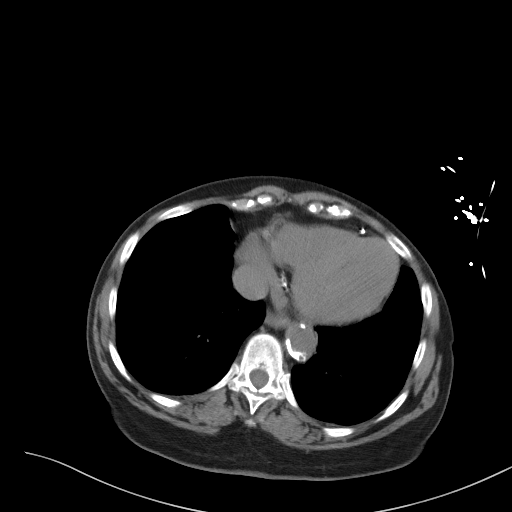

[Series 5: coronal st · coronal · 0.77mm/px · 3 of 85 slices shown]
[im 29/85  soft-tissue]
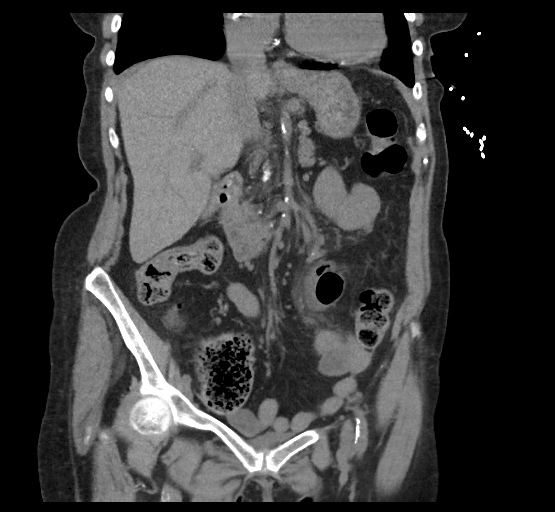
[im 38/85  soft-tissue]
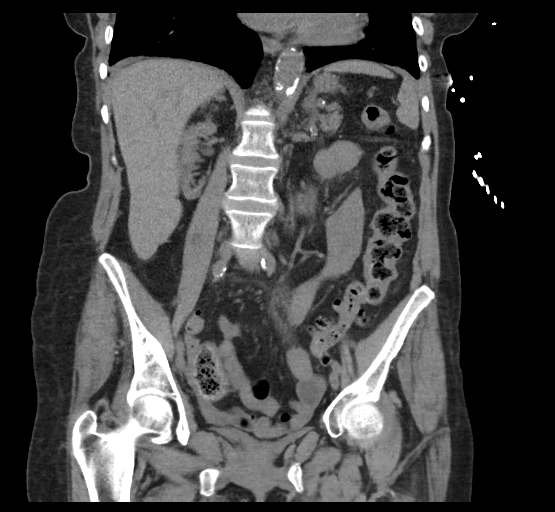
[im 47/85  soft-tissue]
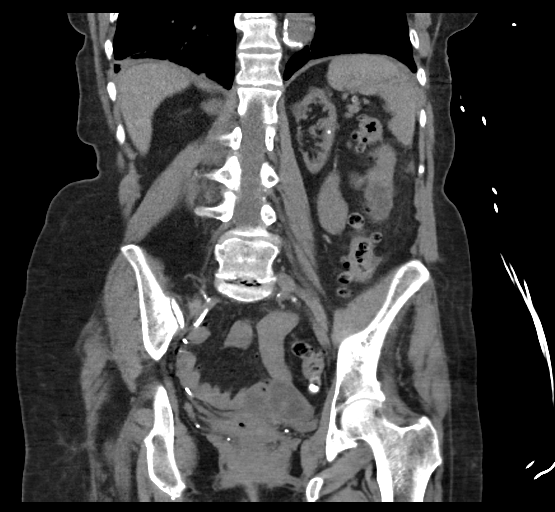

[16 of 46 positions shown; findings below may reference images not displayed]

FINDINGS: Lower chest: Mild cardiomegaly. Severe coronary artery
calcification. Left lower lobe platelike atelectasis.

Hepatobiliary: No focal liver abnormality. Cholelithiasis. No intra
or extrahepatic biliary ductal dilatation.

Pancreas: Unremarkable. No pancreatic ductal dilatation or
surrounding inflammatory changes.

Spleen: Normal in size without focal abnormality.

Adrenals/Urinary Tract: Normal adrenal glands. Right upper kidney
mass measuring 2.9 cm and left kidney interpolar mass measuring
cm are stable. Severe atrophy of kidneys. No hydronephrosis. Bladder
is collapsed.

Stomach/Bowel: Patent rectosigmoid and cecal region anastomosis.
Pancolonic diverticulosis. The short segment of small bowel
inflammatory changes with edema extending into the mesentery
centered around a small focus of air extending beyond the wall of
the small bowel (series 5, image 29) which may represent a contained
perforation or inflamed small bowel diverticulum. No bowel
obstruction.

Vascular/Lymphatic: Aortic atherosclerosis with severe calcific. No
enlarged abdominal or pelvic lymph nodes.

Reproductive: Status post hysterectomy. No adnexal masses.

Other: Small volume of ascites within the left hemiabdomen.

Musculoskeletal: No fracture is seen.
IMPRESSION: 1. Short segment of inflamed small bowel surrounding a small focus
of air extending beyond the bowel wall which may represent a
contained perforation or small bowel diverticulitis. Trace ascites
is likely reactive.
2. Mild cardiomegaly and severe coronary artery calcification.
3. Cholelithiasis.
4. Stable right kidney upper pole 2.9 cm indeterminate mass and left
kidney interpolar 1.6 cm indeterminate mass. Renal protocol CT with
and without contrast is recommended to further characterize. This
recommendation follows ACR consensus guidelines: Management of the
Incidental Renal Mass on CT: A White Paper of the ACR Incidental
Findings Committee. [HOSPITAL] [OE];[DATE].
5. Pan colonic diverticulosis.

By: TIJERINA M.D.

## 2017-03-22 MED ORDER — ONDANSETRON HCL 4 MG/2ML IJ SOLN: 4.0000 mg | Freq: Once | INTRAMUSCULAR | Status: DC

## 2017-03-22 MED ORDER — CLINDAMYCIN PHOSPHATE 600 MG/50ML IV SOLN: 600.0000 mg | Freq: Once | INTRAVENOUS | Status: DC

## 2017-03-22 MED ORDER — LIDOCAINE HCL (PF) 1 % IJ SOLN: 5.0000 mL | INTRAMUSCULAR | Status: DC | PRN

## 2017-03-22 MED ORDER — OXYCODONE HCL 5 MG PO TABS
5.0000 mg | ORAL_TABLET | Freq: Four times a day (QID) | ORAL | Status: DC | PRN
  Administered 2017-03-22 – 2017-04-02 (×9): 5 mg via ORAL
  Filled 2017-03-22 (×10): qty 1

## 2017-03-22 MED ORDER — FENTANYL CITRATE (PF) 100 MCG/2ML IJ SOLN
50.0000 ug | Freq: Once | INTRAMUSCULAR | Status: AC
  Administered 2017-03-22: 50 ug via INTRAMUSCULAR
  Filled 2017-03-22: qty 2

## 2017-03-22 MED ORDER — SODIUM CHLORIDE 0.9 % IV SOLN: 1.0000 g | Freq: Once | INTRAVENOUS | Status: DC

## 2017-03-22 MED ORDER — RENA-VITE PO TABS
1.0000 | ORAL_TABLET | Freq: Every day | ORAL | Status: DC
  Administered 2017-03-23 – 2017-03-27 (×4): 1 via ORAL
  Filled 2017-03-22 (×5): qty 1

## 2017-03-22 MED ORDER — DIPHENHYDRAMINE HCL 25 MG PO CAPS
25.0000 mg | ORAL_CAPSULE | Freq: Four times a day (QID) | ORAL | Status: DC | PRN
  Administered 2017-03-22 – 2017-03-28 (×7): 25 mg via ORAL
  Filled 2017-03-22 (×8): qty 1

## 2017-03-22 MED ORDER — FLUTICASONE PROPIONATE 50 MCG/ACT NA SUSP: 2.0000 | Freq: Every evening | NASAL | Status: DC | PRN

## 2017-03-22 MED ORDER — PENTAFLUOROPROP-TETRAFLUOROETH EX AERO: 1.0000 "application " | INHALATION_SPRAY | CUTANEOUS | Status: DC | PRN

## 2017-03-22 MED ORDER — ONDANSETRON 4 MG PO TBDP
4.0000 mg | ORAL_TABLET | Freq: Once | ORAL | Status: AC
Start: 2017-03-22 — End: 2017-03-22
  Administered 2017-03-22: 4 mg via ORAL
  Filled 2017-03-22: qty 1

## 2017-03-22 MED ORDER — SODIUM CHLORIDE 0.9 % IV SOLN: 100.0000 mL | INTRAVENOUS | Status: DC | PRN

## 2017-03-22 MED ORDER — CAMPHOR-MENTHOL 0.5-0.5 % EX LOTN
TOPICAL_LOTION | CUTANEOUS | Status: DC | PRN
  Administered 2017-03-24: 11:00:00 via TOPICAL
  Filled 2017-03-22: qty 222

## 2017-03-22 MED ORDER — LIDOCAINE-PRILOCAINE 2.5-2.5 % EX CREA: 1.0000 "application " | TOPICAL_CREAM | CUTANEOUS | Status: DC | PRN

## 2017-03-22 MED ORDER — ISOSORBIDE MONONITRATE ER 60 MG PO TB24
120.0000 mg | ORAL_TABLET | Freq: Every day | ORAL | Status: DC
  Administered 2017-03-24 – 2017-04-05 (×10): 120 mg via ORAL
  Filled 2017-03-22 (×12): qty 2

## 2017-03-22 MED ORDER — HEPARIN SODIUM (PORCINE) 1000 UNIT/ML IJ SOLN
INTRAMUSCULAR | Status: AC
  Administered 2017-03-22: 1000 [IU] via INTRAVENOUS_CENTRAL
  Filled 2017-03-22: qty 1

## 2017-03-22 MED ORDER — TICAGRELOR 90 MG PO TABS
90.0000 mg | ORAL_TABLET | Freq: Two times a day (BID) | ORAL | Status: DC
  Administered 2017-03-22 – 2017-04-05 (×25): 90 mg via ORAL
  Filled 2017-03-22 (×27): qty 1

## 2017-03-22 MED ORDER — OMEPRAZOLE 20 MG PO CPDR
20.0000 mg | DELAYED_RELEASE_CAPSULE | Freq: Every day | ORAL | Status: DC
  Filled 2017-03-22 (×3): qty 1

## 2017-03-22 MED ORDER — ALPRAZOLAM 0.25 MG PO TABS
0.2500 mg | ORAL_TABLET | Freq: Two times a day (BID) | ORAL | Status: DC | PRN
  Administered 2017-04-01 – 2017-04-02 (×2): 0.25 mg via ORAL
  Filled 2017-03-22 (×3): qty 1

## 2017-03-22 MED ORDER — CLONIDINE HCL 0.2 MG PO TABS: 0.2000 mg | ORAL_TABLET | Freq: Two times a day (BID) | ORAL | Status: DC | PRN

## 2017-03-22 MED ORDER — METRONIDAZOLE IN NACL 5-0.79 MG/ML-% IV SOLN: 500.0000 mg | Freq: Once | INTRAVENOUS | Status: DC

## 2017-03-22 MED ORDER — FENTANYL CITRATE (PF) 100 MCG/2ML IJ SOLN: 25.0000 ug | Freq: Once | INTRAMUSCULAR | Status: DC

## 2017-03-22 MED ORDER — AMIODARONE HCL 200 MG PO TABS
200.0000 mg | ORAL_TABLET | Freq: Every day | ORAL | Status: DC
  Administered 2017-03-23 – 2017-04-05 (×13): 200 mg via ORAL
  Filled 2017-03-22 (×13): qty 1

## 2017-03-22 MED ORDER — SEVELAMER CARBONATE 800 MG PO TABS
800.0000 mg | ORAL_TABLET | Freq: Three times a day (TID) | ORAL | Status: DC
  Administered 2017-03-22 – 2017-04-04 (×20): 800 mg via ORAL
  Filled 2017-03-22 (×20): qty 1

## 2017-03-22 MED ORDER — OMEPRAZOLE 20 MG PO CPDR: 20.0000 mg | DELAYED_RELEASE_CAPSULE | Freq: Every day | ORAL | Status: DC

## 2017-03-22 MED ORDER — HYDRALAZINE HCL 20 MG/ML IJ SOLN
5.0000 mg | INTRAMUSCULAR | Status: DC | PRN
  Administered 2017-03-22: 5 mg via INTRAVENOUS
  Filled 2017-03-22 (×2): qty 1

## 2017-03-22 MED ORDER — PANTOPRAZOLE SODIUM 40 MG PO TBEC: 40.0000 mg | DELAYED_RELEASE_TABLET | Freq: Every day | ORAL | Status: DC

## 2017-03-22 MED ORDER — HEPARIN SODIUM (PORCINE) 5000 UNIT/ML IJ SOLN
5000.0000 [IU] | Freq: Three times a day (TID) | INTRAMUSCULAR | Status: DC
  Administered 2017-03-22 – 2017-03-23 (×3): 5000 [IU] via SUBCUTANEOUS
  Filled 2017-03-22 (×7): qty 1

## 2017-03-22 MED ORDER — CINACALCET HCL 30 MG PO TABS
30.0000 mg | ORAL_TABLET | Freq: Every day | ORAL | Status: DC
  Administered 2017-03-22 – 2017-04-04 (×12): 30 mg via ORAL
  Filled 2017-03-22 (×12): qty 1

## 2017-03-22 MED ORDER — ONDANSETRON HCL 4 MG/2ML IJ SOLN: 4.0000 mg | Freq: Four times a day (QID) | INTRAMUSCULAR | Status: DC | PRN

## 2017-03-22 MED ORDER — ASPIRIN EC 81 MG PO TBEC
81.0000 mg | DELAYED_RELEASE_TABLET | Freq: Every day | ORAL | Status: DC
  Administered 2017-03-23 – 2017-04-05 (×13): 81 mg via ORAL
  Filled 2017-03-22 (×14): qty 1

## 2017-03-22 MED ORDER — HEPARIN SODIUM (PORCINE) 1000 UNIT/ML DIALYSIS
1000.0000 [IU] | INTRAMUSCULAR | Status: DC | PRN
Start: 2017-03-22 — End: 2017-03-27
  Administered 2017-03-22: 1000 [IU] via INTRAVENOUS_CENTRAL
  Filled 2017-03-22 (×2): qty 1

## 2017-03-22 MED ORDER — CARVEDILOL 6.25 MG PO TABS
6.2500 mg | ORAL_TABLET | Freq: Two times a day (BID) | ORAL | Status: DC
  Administered 2017-03-22 – 2017-04-04 (×24): 6.25 mg via ORAL
  Filled 2017-03-22 (×2): qty 1
  Filled 2017-03-22 (×2): qty 2
  Filled 2017-03-22: qty 1
  Filled 2017-03-22 (×2): qty 2
  Filled 2017-03-22: qty 1
  Filled 2017-03-22: qty 2
  Filled 2017-03-22 (×2): qty 1
  Filled 2017-03-22: qty 2
  Filled 2017-03-22 (×6): qty 1
  Filled 2017-03-22: qty 2
  Filled 2017-03-22 (×4): qty 1
  Filled 2017-03-22: qty 2

## 2017-03-22 MED ORDER — ALTEPLASE 2 MG IJ SOLR
2.0000 mg | Freq: Once | INTRAMUSCULAR | Status: DC | PRN
  Filled 2017-03-22: qty 2

## 2017-03-22 MED ORDER — NITROGLYCERIN 0.4 MG SL SUBL
0.4000 mg | SUBLINGUAL_TABLET | SUBLINGUAL | Status: DC | PRN
  Administered 2017-03-29 – 2017-04-04 (×4): 0.4 mg via SUBLINGUAL
  Filled 2017-03-22 (×5): qty 1

## 2017-03-22 MED ORDER — OMEPRAZOLE 20 MG PO CPDR
20.0000 mg | DELAYED_RELEASE_CAPSULE | Freq: Every day | ORAL | Status: DC
  Filled 2017-03-22 (×2): qty 1

## 2017-03-22 NOTE — Clinical Social Work Note (Signed)
Patient referred to CSW for affordability of medications. This is a CM function. CM aware of referral and will address patient's needs.   LCSW signing off.     Emelin Dascenzo, Clydene Pugh, LCSW

## 2017-03-22 NOTE — ED Notes (Signed)
Vascular wellness will be here approx 10 am. Floor notified

## 2017-03-22 NOTE — ED Provider Notes (Signed)
[facility] DEPT Provider Note   CSN: [id number] Arrival date & time: 03/22/17  0442     History   Chief Complaint Chief Complaint  Patient presents with  . Abdominal Pain    HPI Shawna [name] is a 76 y.o. female.  The history is provided by the patient and the spouse.  Abdominal Pain   This is a new problem. The current episode started 1 to 2 hours ago. The problem occurs constantly. The problem has been gradually worsening. The pain is located in the generalized abdominal region. The pain is severe. Associated symptoms include diarrhea. Pertinent negatives include fever, hematochezia and vomiting. The symptoms are aggravated by certain positions and palpation. Nothing relieves the symptoms.  pt reports abrupt onset of abdominal pain 2 hrs ago She had otherwise been well and at baseline prior to this episode No nausea/vomiting No fever She does not recall having this type of episode previously   She took gas-x without any relief She took a pain pill as well without relief  Past Medical History:  Diagnosis Date  . Acute on chronic respiratory failure with hypoxia ([place]) 10/10/2016  . Anxiety   . Arthritis   . AVM (arteriovenous malformation) of colon   . CAD (coronary artery disease)    a. 12/2011 NSTEMI/Cath/PCI LCX (2.25x14 Resolute DES) & D1 (2.25x22 Resolute DES);  b. 01/2012 Cath/PCI: LM 30, LAD 30p, 40-8m, D1 stent ok, 99 in sm branch of diag, LCX patent stent, OM1 20, RCA 95 ost (4.0x12 Promus DES), EF 55%;  c. 04/2012 Lexi Cardiolite  EF 48%, small area of scar @ base/mid inflat wall with mild peri-infarct ischemia.; CABG 12/4  . Carotid artery disease ([place])    a. 84-66% LICA, 11/9933   . Chronic bronchitis ([place])   . Chronic diastolic CHF (congestive heart failure) ([place])    a. 02/2012 Echo EF 60-65%, nl wall motion, Gr 1 DD, mod MR  . Colon cancer ([place]) 1992  . Esophageal stricture   . ESRD on hemodialysis ([place])    ESRD due to HTN, started dialysis 2011 and gets  HD at [facility] with Dr Hinda Lenis on MWF schedule.  Access is LUA AVF as of Sept 2014.   Marland Kitchen GERD (gastroesophageal reflux disease)   . High cholesterol 12/2011  . History of blood transfusion 07/2011; 12/2011; 01/2012 X 2; 04/2012  . History of gout   . History of lower GI bleeding   . Hypertension   . Iron deficiency anemia   . Mitral regurgitation    a. Moderate by echo, 02/2012  . Myocardial infarction ([place])   . Ovarian cancer ([place]) 1992  . Pneumonia ~ 2009  . PUD (peptic ulcer disease)   . TIA (transient ischemic attack)     Patient Active Problem List   Diagnosis Date Noted  . Complication of vascular access for dialysis 03/19/2017  . Preoperative clearance 01/25/2017  . Symptomatic anemia 10/24/2016  . H/O non-ST elevation myocardial infarction (NSTEMI) 10/24/2016  . Fluid overload 10/10/2016  . Complication from renal dialysis device 10/10/2016  . Non-ST elevation MI (NSTEMI) ([place])   . Encounter for fitting and adjustment of vascular catheter   . End stage renal disease ([place])   . ESRD (end stage renal disease) ([place])   . Heme positive stool   . Demand ischemia ([place]) 07/27/2016  . Hypertensive emergency 07/08/2016  . Acute on chronic respiratory failure with hypoxia ([place])   . Cardiac arrest ([place])   . Palliative care encounter   .  Goals of care, counseling/discussion   . Hypertensive crisis without congestive heart failure 05/09/2016  . Acute pulmonary edema ([place]) 04/06/2016  . Acute respiratory failure ([place]) 04/06/2016  . Hypertensive crisis 01/27/2016  . History of colon cancer 01/27/2016  . History of ovarian cancer 01/27/2016  . Hypertensive urgency 01/27/2016  . Narrow complex tachycardia ([place]) 09/08/2015  . SVT (supraventricular tachycardia) ([place]) 09/08/2015  . Influenza A 08/30/2015  . Acute on chronic diastolic CHF (congestive heart failure) ([place]) 05/04/2015  . Unstable angina ([place]) 05/03/2015  . Essential hypertension   . Pain in joint, lower leg 08/14/2014    . Chest pain 11/26/2013  . Small bowel obstruction, partial ([place]) 05/29/2013  . Chronic diastolic CHF (congestive heart failure) ([place]) 03/22/2013  . GI bleeding 03/21/2013  . Acute blood loss anemia 03/21/2013  . Vaginal odor 03/12/2013  . Vaginal discharge 03/12/2013  . Occlusion and stenosis of carotid artery without mention of cerebral infarction 01/24/2013  . Hx of CABG 07/05/2012  . Carotid artery disease ([place]) 07/05/2012  . Anemia of chronic kidney failure 07/03/2012  . Secondary hyperparathyroidism ([place]) 07/03/2012  . Mitral regurgitation 06/12/2012  . Pneumonia 06/09/2012  . Non-STEMI (non-ST elevated myocardial infarction) ([place]) 06/08/2012  . AVM (arteriovenous malformation) of small bowel, acquired ([place]) 01/20/2012  . GERD (gastroesophageal reflux disease) 01/09/2012  . HLD (hyperlipidemia) 01/05/2012  . Atherosclerosis of native coronary artery of native heart without angina pectoris 12/16/2011  . ESRD on hemodialysis ([place]) 12/16/2011    Past Surgical History:  Procedure Laterality Date  . ABDOMINAL HYSTERECTOMY  1992  . APPENDECTOMY  06/1990  . AV FISTULA PLACEMENT  07/2009   left upper arm  . AV FISTULA PLACEMENT Right 09/06/2016   Procedure: RIGHT FOREARM ARTERIOVENOUS (AV) GRAFT;  Surgeon: Elam Dutch, MD;  [facility];  Service: Vascular;  Laterality: Right;  . AV FISTULA PLACEMENT N/A 02/24/2017   Procedure: INSERTION OF ARTERIOVENOUS (AV) GORE-TEX GRAFT ARM (BRACHIAL AXILLARY);  Surgeon: Katha Cabal, MD;  Location: [facility] ORS;  Service: Vascular;  Laterality: N/A;  . [place] Right 09/06/2016   Procedure: REMOVAL OF Right Arm ARTERIOVENOUS GORETEX GRAFT and Vein Patch angioplasty of brachial artery;  Surgeon: Angelia Mould, MD;  Location: [facility];  Service: Vascular;  Laterality: Right;  . COLON RESECTION  1992  . COLON SURGERY    . CORONARY ANGIOPLASTY WITH STENT PLACEMENT  12/15/11   "2"  . CORONARY ANGIOPLASTY WITH STENT PLACEMENT  y/2013    "1; makes total of 3" (05/02/2012)  . CORONARY ARTERY BYPASS GRAFT  06/13/2012   Procedure: CORONARY ARTERY BYPASS GRAFTING (CABG);  Surgeon: Grace Isaac, MD;  Location: [facility];  Service: Open Heart Surgery;  Laterality: N/A;  cabg x four;  using left internal mammary artery, and left leg greater saphenous vein harvested endoscopically  . CORONARY STENT INTERVENTION N/A 10/13/2016   Procedure: Coronary Stent Intervention;  Surgeon: Troy Sine, MD;  Location: [facility] CV LAB;  Service: Cardiovascular;  Laterality: N/A;  . DILATION AND CURETTAGE OF UTERUS    . ESOPHAGOGASTRODUODENOSCOPY  01/20/2012   Procedure: ESOPHAGOGASTRODUODENOSCOPY (EGD);  Surgeon: Ladene Artist, MD,FACG;  Location: [facility] ENDOSCOPY;  Service: Endoscopy;  Laterality: N/A;  . ESOPHAGOGASTRODUODENOSCOPY N/A 03/26/2013   Procedure: ESOPHAGOGASTRODUODENOSCOPY (EGD);  Surgeon: Irene Shipper, MD;  Location: [facility] ENDOSCOPY;  Service: Endoscopy;  Laterality: N/A;  . ESOPHAGOGASTRODUODENOSCOPY N/A 04/30/2015   Procedure: ESOPHAGOGASTRODUODENOSCOPY (EGD);  Surgeon: Rogene Houston, MD;  [facility];  Service: Endoscopy;  Laterality: N/A;  1pm - moved to 10/20 @ 1:10  . ESOPHAGOGASTRODUODENOSCOPY N/A 07/29/2016   Procedure: ESOPHAGOGASTRODUODENOSCOPY (EGD);  Surgeon: Manus Gunning, MD;  Location: [facility];  Service: Gastroenterology;  Laterality: N/A;  enteroscopy  . INTRAOPERATIVE TRANSESOPHAGEAL ECHOCARDIOGRAM  06/13/2012   Procedure: INTRAOPERATIVE TRANSESOPHAGEAL ECHOCARDIOGRAM;  Surgeon: Grace Isaac, MD;  Location: [facility];  Service: Open Heart Surgery;  Laterality: N/A;  . IR GENERIC HISTORICAL  07/26/2016   IR FLUORO GUIDE CV LINE RIGHT 07/26/2016 Greggory Keen, MD [facility]  . IR GENERIC HISTORICAL  07/26/2016   IR US GUIDE VASC ACCESS RIGHT 07/26/2016 Greggory Keen, MD [facility]  . IR GENERIC HISTORICAL  08/02/2016   IR US GUIDE VASC ACCESS RIGHT 08/02/2016 Greggory Keen, MD [facility]  .  IR GENERIC HISTORICAL  08/02/2016   IR FLUORO GUIDE CV LINE RIGHT 08/02/2016 Greggory Keen, MD [facility]  . LEFT HEART CATH AND CORONARY ANGIOGRAPHY N/A 09/20/2016   Procedure: Left Heart Cath and Coronary Angiography;  Surgeon: [name], MD;  Location: [facility] CV LAB;  Service: Cardiovascular;  Laterality: N/A;  . LEFT HEART CATH AND CORS/GRAFTS ANGIOGRAPHY N/A 10/13/2016   Procedure: Left Heart Cath and Cors/Grafts Angiography;  Surgeon: Troy Sine, MD;  Location: [facility] CV LAB;  Service: Cardiovascular;  Laterality: N/A;  . LEFT HEART CATHETERIZATION WITH CORONARY ANGIOGRAM N/A 12/15/2011   Procedure: LEFT HEART CATHETERIZATION WITH CORONARY ANGIOGRAM;  Surgeon: Burnell Blanks, MD;  Location: [facility] CATH LAB;  Service: Cardiovascular;  Laterality: N/A;  . LEFT HEART CATHETERIZATION WITH CORONARY ANGIOGRAM N/A 01/10/2012   Procedure: LEFT HEART CATHETERIZATION WITH CORONARY ANGIOGRAM;  Surgeon: Peter M Martinique, MD;  Location: [facility] CATH LAB;  Service: Cardiovascular;  Laterality: N/A;  . LEFT HEART CATHETERIZATION WITH CORONARY ANGIOGRAM N/A 06/08/2012   Procedure: LEFT HEART CATHETERIZATION WITH CORONARY ANGIOGRAM;  Surgeon: Burnell Blanks, MD;  Location: [facility] CATH LAB;  Service: Cardiovascular;  Laterality: N/A;  . LEFT HEART CATHETERIZATION WITH CORONARY/GRAFT ANGIOGRAM N/A 12/10/2013   Procedure: LEFT HEART CATHETERIZATION WITH Beatrix Fetters;  Surgeon: [name], MD;  Location: [facility] CATH LAB;  Service: Cardiovascular;  Laterality: N/A;  . OVARY SURGERY     ovarian cancer  . REVISION OF ARTERIOVENOUS GORETEX GRAFT N/A 02/24/2017   Procedure: REVISION OF ARTERIOVENOUS GORETEX GRAFT (RESECTION);  Surgeon: Katha Cabal, MD;  Location: [facility] ORS;  Service: Vascular;  Laterality: N/A;  Earney Mallet N/A 10/15/2013   Procedure: Fistulogram;  Surgeon: Serafina Mitchell, MD;  Location[facility] CATH LAB;  Service: Cardiovascular;  Laterality: N/A;  . THROMBECTOMY /  ARTERIOVENOUS GRAFT REVISION  2011   left upper arm  . TUBAL LIGATION  1980's  . UPPER EXTREMITY ANGIOGRAPHY Bilateral 12/06/2016   Procedure: Upper Extremity Angiography;  Surgeon: Katha Cabal, MD;  Location: [facility] CV LAB;  Service: Cardiovascular;  Laterality: Bilateral;    OB History    Gravida Para Term Preterm AB Living   2 2   2   2    SAB TAB Ectopic Multiple Live Births                   Home Medications    Prior to Admission medications   Medication Sig Start Date End Date Taking? Authorizing Provider  ALPRAZolam (XANAX) 0.25 MG tablet Take 0.25 mg by mouth 2 (two) times daily as needed for anxiety or sleep.  11/19/15   [provider]  amiodarone (PACERONE) 200 MG tablet Take 1  tablet (200 mg total) by mouth daily. 12/08/16   Herminio Commons, MD  aspirin EC 81 MG tablet Take 81 mg by mouth daily.     [provider]  carvedilol (COREG) 6.25 MG tablet Take 1 tablet (6.25 mg total) by mouth 2 (two) times daily with a meal. 11/16/16   Herminio Commons, MD  cinacalcet (SENSIPAR) 30 MG tablet Take 30 mg by mouth daily after supper.    [provider]  cloNIDine (CATAPRES) 0.2 MG tablet Take 1 tablet (0.2 mg total) by mouth 3 (three) times daily. Patient taking differently: Take 0.2 mg by mouth 2 (two) times daily as needed (high blood pressure).  12/08/16   Herminio Commons, MD  diphenhydrAMINE (BENADRYL) 25 mg capsule Take 50 mg by mouth daily as needed for itching or allergies.     [provider]  epoetin alfa (EPOGEN,PROCRIT) 53664 UNIT/ML injection Inject 1 mL (10,000 Units total) into the vein every Monday, Wednesday, and Friday with hemodialysis. 04/11/16   Orvan Falconer, MD  fluticasone (FLONASE) 50 MCG/ACT nasal spray Place 2 sprays into the nose at bedtime as needed for allergies.     [provider]  hydrALAZINE (APRESOLINE) 50 MG tablet Take 2 tablets (100 mg total) by mouth 3 (three) times daily. Patient  taking differently: Take 100 mg by mouth 2 (two) times daily as needed (for high blood pressure readings).  11/10/16 02/20/17  Herminio Commons, MD  isosorbide mononitrate (IMDUR) 120 MG 24 hr tablet Take 1 tablet (120 mg total) by mouth daily. 01/21/17   Caren Griffins, MD  loratadine (CLARITIN) 10 MG tablet Take 10 mg by mouth daily as needed for allergies.     [provider]  multivitamin (RENA-VIT) TABS tablet Take 1 tablet by mouth daily.    [provider]  nitroGLYCERIN (NITROSTAT) 0.4 MG SL tablet Place 1 tablet (0.4 mg total) under the tongue every 5 (five) minutes as needed for chest pain. 08/02/16   Kinnie Feil, MD  omeprazole (PRILOSEC) 20 MG capsule Take 20 mg by mouth daily.  09/12/16   [provider]  ondansetron (ZOFRAN-ODT) 4 MG disintegrating tablet Take 4 mg by mouth 2 (two) times daily as needed for nausea or vomiting.    [provider]  oxycodone (OXY-IR) 5 MG capsule Take 1 capsule (5 mg total) by mouth every 6 (six) hours as needed for pain. 02/24/17   Schnier, Dolores Lory, MD  sevelamer carbonate (RENVELA) 800 MG tablet Take 800 mg by mouth 3 (three) times daily with meals.    [provider]  simvastatin (ZOCOR) 20 MG tablet TAKE ONE TABLET BY MOUTH AT BEDTIME 02/13/17   Herminio Commons, MD  ticagrelor (BRILINTA) 90 MG TABS tablet Take 90 mg by mouth 2 (two) times daily.    [provider]    Family History Family History  Problem Relation Age of Onset  . Heart disease Mother        Heart Disease before age 55  . Hyperlipidemia Mother   . Hypertension Mother   . Diabetes Mother   . Heart attack Mother   . Heart disease Father        Heart Disease before age 64  . Hyperlipidemia Father   . Hypertension Father   . Diabetes Father   . Diabetes Sister   . Hypertension Sister   . Diabetes Brother   . Hyperlipidemia Brother   . Heart attack Brother   .  Hypertension Sister   . Heart attack Brother   .  Other Unknown        noncontributory for early CAD  . Colon cancer Neg Hx   . Esophageal cancer Neg Hx   . Liver disease Neg Hx   . Kidney disease Neg Hx   . Colon polyps Neg Hx     Social History Social History  Substance Use Topics  . Smoking status: Never Smoker  . Smokeless tobacco: Never Used  . Alcohol use No     Allergies   Aspirin; Penicillins; Amlodipine; Bactrim [sulfamethoxazole-trimethoprim]; Contrast media [iodinated diagnostic agents]; Iron; Nitrofurantoin; Tylenol [acetaminophen]; Gabapentin; Dexilant [dexlansoprazole]; Levaquin [levofloxacin in d5w]; Morphine and related; Plavix [clopidogrel bisulfate]; Protonix [pantoprazole sodium]; and Venofer [ferric oxide]   Review of Systems Review of Systems  Constitutional: Negative for fever.  Cardiovascular: Negative for chest pain.  Gastrointestinal: Positive for abdominal pain and diarrhea. Negative for hematochezia and vomiting.  All other systems reviewed and are negative.    Physical Exam Updated Vital Signs BP (!) 174/61 (BP Location: Right Arm)   Pulse 69   Temp 98.4 F (36.9 C) (Oral)   Resp 14   Ht 1.575 m (5\' 2" )   Wt 57.2 kg (126 lb)   SpO2 98%   BMI 23.05 kg/m   Physical Exam CONSTITUTIONAL: Elderly, uncomfortable appearing HEAD: Normocephalic/atraumatic EYES: EOMI ENMT: Mucous membranes moist NECK: supple no meningeal signs SPINE/BACK:entire spine nontender CV: S1/S2 noted, no murmurs/rubs/gallops noted LUNGS: Lungs are clear to auscultation bilaterally, no apparent distress ABDOMEN: soft, moderate RLQ/LLQ tenderness, decreased bowel sounds throughout GU:no cva tenderness NEURO: Pt is awake/alert/appropriate, moves all extremitiesx4.  No facial droop.   EXTREMITIES: pulses normal/equal, full ROM SKIN: warm, color normal, dialysis catheter in right chest PSYCH: no abnormalities of mood noted, alert and oriented to situation   ED Treatments / Results  Labs (all labs ordered are  listed, but only abnormal results are displayed) Labs Reviewed  COMPREHENSIVE METABOLIC PANEL - Abnormal; Notable for the following:       Result Value   Potassium 5.2 (*)    Chloride 99 (*)    BUN 36 (*)    Creatinine, Ser 8.26 (*)    Calcium 8.5 (*)    GFR calc non Af Amer 4 (*)    GFR calc Af Amer 5 (*)    All other components within normal limits  CBC WITH DIFFERENTIAL/PLATELET - Abnormal; Notable for the following:    MCV 105.2 (*)    RDW 18.1 (*)    Lymphs Abs 0.6 (*)    All other components within normal limits  LIPASE, BLOOD  I-STAT CG4 LACTIC ACID, ED    EKG  EKG Interpretation  Date/Time:  Wednesday March 22 2017 04:53:51 EDT Ventricular Rate:  71 PR Interval:    QRS Duration: 111 QT Interval:  457 QTC Calculation: 497 R Axis:   92 Text Interpretation:  Sinus rhythm Right axis deviation Consider left ventricular hypertrophy Probable lateral infarct, age indeterminate Anterior Q waves, possibly due to LVH Nonspecific T abnormalities, lateral leads Confirmed by Ripley Fraise [phone]) on 03/22/2017 5:03:52 AM       Radiology Ct Abdomen Pelvis Wo Contrast  Result Date: 03/22/2017 CLINICAL DATA:  76 y/o F; generalized abdominal pain and abdominal distention. EXAM: CT ABDOMEN AND PELVIS WITHOUT CONTRAST TECHNIQUE: Multidetector CT imaging of the abdomen and pelvis was performed following the standard protocol without IV contrast. COMPARISON:  11/18/2016 CT of abdomen and pelvis. FINDINGS: Lower chest:  Mild cardiomegaly. Severe coronary artery calcification. Left lower lobe platelike atelectasis. Hepatobiliary: No focal liver abnormality. Cholelithiasis. No intra or extrahepatic biliary ductal dilatation. Pancreas: Unremarkable. No pancreatic ductal dilatation or surrounding inflammatory changes. Spleen: Normal in size without focal abnormality. Adrenals/Urinary Tract: Normal adrenal glands. Right upper kidney mass measuring 2.9 cm and left kidney interpolar mass  measuring 1.6 cm are stable. Severe atrophy of kidneys. No hydronephrosis. Bladder is collapsed. Stomach/Bowel: Patent rectosigmoid and cecal region anastomosis. Pancolonic diverticulosis. The short segment of small bowel inflammatory changes with edema extending into the mesentery centered around a small focus of air extending beyond the wall of the small bowel (series 5, image 29) which may represent a contained perforation or inflamed small bowel diverticulum. No bowel obstruction. Vascular/Lymphatic: Aortic atherosclerosis with severe calcific. No enlarged abdominal or pelvic lymph nodes. Reproductive: Status post hysterectomy. No adnexal masses. Other: Small volume of ascites within the left hemiabdomen. Musculoskeletal: No fracture is seen. IMPRESSION: 1. Short segment of inflamed small bowel surrounding a small focus of air extending beyond the bowel wall which may represent a contained perforation or small bowel diverticulitis. Trace ascites is likely reactive. 2. Mild cardiomegaly and severe coronary artery calcification. 3. Cholelithiasis. 4. Stable right kidney upper pole 2.9 cm indeterminate mass and left kidney interpolar 1.6 cm indeterminate mass. Renal protocol CT with and without contrast is recommended to further characterize. This recommendation follows ACR consensus guidelines: Management of the Incidental Renal Mass on CT: A White Paper of the ACR Incidental Findings Committee. J Am Coll Radiol [phone]. 5. Pan colonic diverticulosis. Electronically Signed   By: Kristine Garbe M.D.   On: 03/22/2017 06:47   Dg Abdomen Acute W/chest  Result Date: 03/22/2017 CLINICAL DATA:  Diffuse abdominal pain. EXAM: DG ABDOMEN ACUTE W/ 1V CHEST COMPARISON:  Radiograph 02/08/2017.  CT 11/18/2016 FINDINGS: Right-sided dialysis catheter with tip in the distal SVC. Cardiomegaly. Post median sternotomy. Bibasilar atelectasis, right greater than left. No pulmonary edema or pleural fluid. No free  intra-abdominal air. No bowel dilatation to suggest obstruction. Scattered retained barium within colonic diverticula. Multiple enteric sutures and surgical clips in the pelvis. Small volume of colonic stool. IMPRESSION: 1. Normal bowel gas pattern. No free air. Retained barium within colonic diverticulum. 2. Mild bibasilar atelectasis.  Mild cardiomegaly. Electronically Signed   By: Jeb Levering M.D.   On: 03/22/2017 06:00    Procedures Procedures (including critical care time)  Medications Ordered in ED Medications  clindamycin (CLEOCIN) IVPB 600 mg (not administered)  ondansetron (ZOFRAN-ODT) disintegrating tablet 4 mg (4 mg Oral Given 03/22/17 0551)  fentaNYL (SUBLIMAZE) injection 50 mcg (50 mcg Intramuscular Given 03/22/17 0551)     Initial Impression / Assessment and Plan / ED Course  I have reviewed the triage vital signs and the nursing notes.  Pertinent labs & imaging results that were available during my care of the patient were reviewed by me and considered in my medical decision making (see chart for details).     5:07 AM Pt with acute onset of abdominal pain She is a dialysis patient (she does not make urine) Will start with xray imaging, then likely CT imaging if xray negative 6:23 AM Pt appears more comfortable but she still reports pain Will proceed with CT imaging Due to allergies, will perform CT imaging without contrast 7:01 AM CT imaging reported noted D/w dr Arnoldo Morale, with surgery Recommends medicine admit Start clindamycin and possibly aztreonam due to multiple drug allergies Pt also has incidental findings that will need  followed upon 7:35 AM  D/w dr Doyle Askew with hospitalist She will admit  D/w pharmacy Pt should be safe to have invanz for intra-abdominal infection and can be given during/after dialysis This may be need to be administered via dialysis line due to poor access  If necessary, dr Arnoldo Morale can place centra line if necessary   Final  Clinical Impressions(s) / ED Diagnoses   Final diagnoses:  Diverticulitis  Renal mass    New Prescriptions New Prescriptions   No medications on file     Ripley Fraise, MD 03/22/17 0740

## 2017-03-22 NOTE — ED Notes (Signed)
Per Dr [name] call placed to Vascular access for peripheral access. CN looked for IV site with ultrasound without success. Dr Arnoldo Morale  Into see Pt. Pt abt can be given by dialysis. CN on 300 aware no peripheral IV access. Report given to kayla on 300.

## 2017-03-22 NOTE — Consult Note (Signed)
Reason for Consult: Enteritis with perforation Referring Physician: Dr. Morrell Riddle KENDALLYN LIPPOLD is an 76 y.o. female.  HPI: Patient is a 76 year old black female with multiple medical problems who presented with acute onset of abdominal pain. She states that the whole lower half of her abdomen started hurting acutely. She did have some nausea, but no vomiting. No fever or chills were noted. Her last bowel movement was this morning. No diarrhea was noted. She was supposed to have dialysis today but then presented to the emergency room. A CT scan of the abdomen reveals a contained area of air around a segment of inflamed small intestine. No significant free fluid is noted. No frank pneumoperitoneum is noted. She did receive some pain medication and states her pain is significantly improved. It is now 3 out of 10. She saw Dr. Laural Golden of gastroenterology within the last 2 weeks. She has had history of AVMs in the past.  Past Medical History:  Diagnosis Date  . Acute on chronic respiratory failure with hypoxia ([place]) 10/10/2016  . Anxiety   . Arthritis   . AVM (arteriovenous malformation) of colon   . CAD (coronary artery disease)    a. 12/2011 NSTEMI/Cath/PCI LCX (2.25x14 Resolute DES) & D1 (2.25x22 Resolute DES);  b. 01/2012 Cath/PCI: LM 30, LAD 30p, 40-80m D1 stent ok, 99 in sm branch of diag, LCX patent stent, OM1 20, RCA 95 ost (4.0x12 Promus DES), EF 55%;  c. 04/2012 Lexi Cardiolite  EF 48%, small area of scar @ base/mid inflat wall with mild peri-infarct ischemia.; CABG 12/4  . Carotid artery disease ([place]    a. 678-24%LICA, 92/3536  . Chronic bronchitis ([place]   . Chronic diastolic CHF (congestive heart failure) ([place]    a. 02/2012 Echo EF 60-65%, nl wall motion, Gr 1 DD, mod MR  . Colon cancer ([place] 1992  . Esophageal stricture   . ESRD on hemodialysis ([place]    ESRD due to HTN, started dialysis 2011 [facility].  Access is LUA AVF as of Sept 2014.   .Marland Kitchen GERD (gastroesophageal reflux disease)   . High cholesterol 12/2011  . History of blood transfusion 07/2011; 12/2011; 01/2012 X 2; 04/2012  . History of gout   . History of lower GI bleeding   . Hypertension   . Iron deficiency anemia   . Mitral regurgitation    a. Moderate by echo, 02/2012  . Myocardial infarction ([place]   . Ovarian cancer ([place] 1992  . Pneumonia ~ 2009  . PUD (peptic ulcer disease)   . TIA (transient ischemic attack)     Past Surgical History:  Procedure Laterality Date  . ABDOMINAL HYSTERECTOMY  1992  . APPENDECTOMY  06/1990  . AV FISTULA PLACEMENT  07/2009   left upper arm  . AV FISTULA PLACEMENT Right 09/06/2016   Procedure: RIGHT FOREARM ARTERIOVENOUS (AV) GRAFT;  Surgeon: CElam Dutch MD;  [facility];  Service: Vascular;  Laterality: Right;  . AV FISTULA PLACEMENT N/A 02/24/2017   Procedure: INSERTION OF ARTERIOVENOUS (AV) GORE-TEX GRAFT ARM (BRACHIAL AXILLARY);  Surgeon: SKatha Cabal MD;  Location: [facility] ORS;  Service: Vascular;  Laterality: N/A;  . ABeverlyRight 09/06/2016   Procedure: REMOVAL OF Right Arm ARTERIOVENOUS GORETEX GRAFT and Vein Patch angioplasty of brachial artery;  Surgeon: CAngelia Mould MD;  [place] [facility]  Service: Vascular;  Laterality: Right;  . COLON RESECTION  1992  .  COLON SURGERY    . CORONARY ANGIOPLASTY WITH STENT PLACEMENT  12/15/11   "2"  . CORONARY ANGIOPLASTY WITH STENT PLACEMENT  y/2013   "1; makes total of 3" (05/02/2012)  . CORONARY ARTERY BYPASS GRAFT  06/13/2012   Procedure: CORONARY ARTERY BYPASS GRAFTING (CABG);  Surgeon: Grace Isaac, MD;  Location: [place];  Service: Open Heart Surgery;  Laterality: N/A;  cabg x four;  using left internal mammary artery, and left leg greater saphenous vein harvested endoscopically  . CORONARY STENT INTERVENTION N/A 10/13/2016   Procedure: Coronary Stent Intervention;  Surgeon: Troy Sine, MD;  Location: [place] CV LAB;  Service: Cardiovascular;   Laterality: N/A;  . DILATION AND CURETTAGE OF UTERUS    . ESOPHAGOGASTRODUODENOSCOPY  01/20/2012   Procedure: ESOPHAGOGASTRODUODENOSCOPY (EGD);  Surgeon: Ladene Artist, MD,FACG;  Location[facility] ENDOSCOPY;  Service: Endoscopy;  Laterality: N/A;  . ESOPHAGOGASTRODUODENOSCOPY N/A 03/26/2013   Procedure: ESOPHAGOGASTRODUODENOSCOPY (EGD);  Surgeon: Irene Shipper, MD;  Location: [facility] ENDOSCOPY;  Service: Endoscopy;  Laterality: N/A;  . ESOPHAGOGASTRODUODENOSCOPY N/A 04/30/2015   Procedure: ESOPHAGOGASTRODUODENOSCOPY (EGD);  Surgeon: Rogene Houston, MD;  [facility];  Service: Endoscopy;  Laterality: N/A;  1pm - moved to 10/20 @ 1:10  . ESOPHAGOGASTRODUODENOSCOPY N/A 07/29/2016   Procedure: ESOPHAGOGASTRODUODENOSCOPY (EGD);  Surgeon: Manus Gunning, MD;  Location: [place];  Service: Gastroenterology;  Laterality: N/A;  enteroscopy  . INTRAOPERATIVE TRANSESOPHAGEAL ECHOCARDIOGRAM  06/13/2012   Procedure: INTRAOPERATIVE TRANSESOPHAGEAL ECHOCARDIOGRAM;  Surgeon: Grace Isaac, MD;  Location: [place];  Service: Open Heart Surgery;  Laterality: N/A;  . IR GENERIC HISTORICAL  07/26/2016   IR FLUORO GUIDE CV LINE RIGHT 07/26/2016 Greggory Keen, MD [facility]  . IR GENERIC HISTORICAL  07/26/2016   IR US GUIDE VASC ACCESS RIGHT 07/26/2016 Greggory Keen, MD [facility]  . IR GENERIC HISTORICAL  08/02/2016   IR US GUIDE VASC ACCESS RIGHT 08/02/2016 Greggory Keen, MD [facility]  . IR GENERIC HISTORICAL  08/02/2016   IR FLUORO GUIDE CV LINE RIGHT 08/02/2016 Greggory Keen, MD [facility]  . LEFT HEART CATH AND CORONARY ANGIOGRAPHY N/A 09/20/2016   Procedure: Left Heart Cath and Coronary Angiography;  Surgeon: [name], MD;  Location: [place] CV LAB;  Service: Cardiovascular;  Laterality: N/A;  . LEFT HEART CATH AND CORS/GRAFTS ANGIOGRAPHY N/A 10/13/2016   Procedure: Left Heart Cath and Cors/Grafts Angiography;  Surgeon: Troy Sine, MD;  Location: [place] CV LAB;  Service:  Cardiovascular;  Laterality: N/A;  . LEFT HEART CATHETERIZATION WITH CORONARY ANGIOGRAM N/A 12/15/2011   Procedure: LEFT HEART CATHETERIZATION WITH CORONARY ANGIOGRAM;  Surgeon: Burnell Blanks, MD;  Location: [facility] CATH LAB;  Service: Cardiovascular;  Laterality: N/A;  . LEFT HEART CATHETERIZATION WITH CORONARY ANGIOGRAM N/A 01/10/2012   Procedure: LEFT HEART CATHETERIZATION WITH CORONARY ANGIOGRAM;  Surgeon: Peter M Martinique, MD;  Location: [facility] CATH LAB;  Service: Cardiovascular;  Laterality: N/A;  . LEFT HEART CATHETERIZATION WITH CORONARY ANGIOGRAM N/A 06/08/2012   Procedure: LEFT HEART CATHETERIZATION WITH CORONARY ANGIOGRAM;  Surgeon: Burnell Blanks, MD;  Location: [facility] CATH LAB;  Service: Cardiovascular;  Laterality: N/A;  . LEFT HEART CATHETERIZATION WITH CORONARY/GRAFT ANGIOGRAM N/A 12/10/2013   Procedure: LEFT HEART CATHETERIZATION WITH Beatrix Fetters;  Surgeon: [name], MD;  Location: [facility] CATH LAB;  Service: Cardiovascular;  Laterality: N/A;  . OVARY SURGERY     ovarian cancer  . REVISION OF ARTERIOVENOUS GORETEX GRAFT N/A 02/24/2017   Procedure: REVISION OF ARTERIOVENOUS  GORETEX GRAFT (RESECTION);  Surgeon: Katha Cabal, MD;  Location: [facility] ORS;  Service: Vascular;  Laterality: N/A;  Earney Mallet N/A 10/15/2013   Procedure: Fistulogram;  Surgeon: Serafina Mitchell, MD;  Location[facility] CATH LAB;  Service: Cardiovascular;  Laterality: N/A;  . THROMBECTOMY / ARTERIOVENOUS GRAFT REVISION  2011   left upper arm  . TUBAL LIGATION  1980's  . UPPER EXTREMITY ANGIOGRAPHY Bilateral 12/06/2016   Procedure: Upper Extremity Angiography;  Surgeon: Katha Cabal, MD;  Location: [place] CV LAB;  Service: Cardiovascular;  Laterality: Bilateral;    Family History  Problem Relation Age of Onset  . Heart disease Mother        Heart Disease before age 47  . Hyperlipidemia Mother   . Hypertension Mother   . Diabetes Mother   . Heart attack Mother   . Heart disease  Father        Heart Disease before age 16  . Hyperlipidemia Father   . Hypertension Father   . Diabetes Father   . Diabetes Sister   . Hypertension Sister   . Diabetes Brother   . Hyperlipidemia Brother   . Heart attack Brother   . Hypertension Sister   . Heart attack Brother   . Other Unknown        noncontributory for early CAD  . Colon cancer Neg Hx   . Esophageal cancer Neg Hx   . Liver disease Neg Hx   . Kidney disease Neg Hx   . Colon polyps Neg Hx     Social History:  reports that she has never smoked. She has never used smokeless tobacco. She reports that she does not drink alcohol or use drugs.  Allergies:  Allergies  Allergen Reactions  . Aspirin Other (See Comments)    High Doses Mess up her stomach; "makes my bowels have blood in them". Takes 81 mg EC Aspirin   . Penicillins Other (See Comments)    SYNCOPE? , "makes me real weak when I take it; like I'll pass out"  Has patient had a PCN reaction causing immediate rash, facial/tongue/throat swelling, SOB or lightheadedness with hypotension: Yes Has patient had a PCN reaction causing severe rash involving mucus membranes or skin necrosis: no Has patient had a PCN reaction that required hospitalization no Has patient had a PCN reaction occurring within the last 10 years: no If all of the above  . Amlodipine Swelling  . Bactrim [Sulfamethoxazole-Trimethoprim] Rash  . Contrast Media [Iodinated Diagnostic Agents] Itching  . Iron Itching and Other (See Comments)    "they gave me iron in dialysis; had to give me Benadryl cause I had to have the iron" (05/02/2012)  . Nitrofurantoin Hives  . Tylenol [Acetaminophen] Itching and Other (See Comments)    Makes her feet on fire per pt  . Gabapentin Other (See Comments)    unspecified  . Dexilant [Dexlansoprazole] Other (See Comments)    Upset stomach  . Levaquin [Levofloxacin In D5w] Rash  . Morphine And Related Itching    Itching in feet  . Plavix [Clopidogrel  Bisulfate] Rash  . Protonix [Pantoprazole Sodium] Rash  . Venofer [Ferric Oxide] Itching    Patient reports using Benadryl prior to doses as [place]    Medications:  Scheduled: . heparin  5,000 Units Subcutaneous Q8H    Results for orders placed or performed during the hospital encounter of 03/22/17 (from the past 48 hour(s))  Comprehensive metabolic panel     Status:  Abnormal   Collection Time: 03/22/17  6:02 AM  Result Value Ref Range   Sodium 138 135 - 145 mmol/L   Potassium 5.2 (H) 3.5 - 5.1 mmol/L   Chloride 99 (L) 101 - 111 mmol/L   CO2 25 22 - 32 mmol/L   Glucose, Bld 96 65 - 99 mg/dL   BUN 36 (H) 6 - 20 mg/dL   Creatinine, Ser 8.26 (H) 0.44 - 1.00 mg/dL   Calcium 8.5 (L) 8.9 - 10.3 mg/dL   Total Protein 7.4 6.5 - 8.1 g/dL   Albumin 4.0 3.5 - 5.0 g/dL   AST 19 15 - 41 U/L   ALT 14 14 - 54 U/L   Alkaline Phosphatase 104 38 - 126 U/L   Total Bilirubin 0.5 0.3 - 1.2 mg/dL   GFR calc non Af Amer 4 (L) >60 mL/min   GFR calc Af Amer 5 (L) >60 mL/min    Comment: (NOTE) The eGFR has been calculated using the CKD EPI equation. This calculation has not been validated in all clinical situations. eGFR's persistently <60 mL/min signify possible Chronic Kidney Disease.    Anion gap 14 5 - 15  CBC with Differential/Platelet     Status: Abnormal   Collection Time: 03/22/17  6:02 AM  Result Value Ref Range   WBC 8.6 4.0 - 10.5 K/uL   RBC 4.07 3.87 - 5.11 MIL/uL   Hemoglobin 13.5 12.0 - 15.0 g/dL   HCT 42.8 36.0 - 46.0 %   MCV 105.2 (H) 78.0 - 100.0 fL   MCH 33.2 26.0 - 34.0 pg   MCHC 31.5 30.0 - 36.0 g/dL   RDW 18.1 (H) 11.5 - 15.5 %   Platelets 260 150 - 400 K/uL   Neutrophils Relative % 84 %   Neutro Abs 7.2 1.7 - 7.7 K/uL   Lymphocytes Relative 7 %   Lymphs Abs 0.6 (L) 0.7 - 4.0 K/uL   Monocytes Relative 4 %   Monocytes Absolute 0.4 0.1 - 1.0 K/uL   Eosinophils Relative 5 %   Eosinophils Absolute 0.4 0.0 - 0.7 K/uL   Basophils Relative 0 %   Basophils  Absolute 0.0 0.0 - 0.1 K/uL  Lipase, blood     Status: None   Collection Time: 03/22/17  6:02 AM  Result Value Ref Range   Lipase 31 11 - 51 U/L  I-Stat CG4 Lactic Acid, ED     Status: None   Collection Time: 03/22/17  6:15 AM  Result Value Ref Range   Lactic Acid, Venous 1.84 0.5 - 1.9 mmol/L    Ct Abdomen Pelvis Wo Contrast  Result Date: 03/22/2017 CLINICAL DATA:  76 y/o F; generalized abdominal pain and abdominal distention. EXAM: CT ABDOMEN AND PELVIS WITHOUT CONTRAST TECHNIQUE: Multidetector CT imaging of the abdomen and pelvis was performed following the standard protocol without IV contrast. COMPARISON:  11/18/2016 CT of abdomen and pelvis. FINDINGS: Lower chest: Mild cardiomegaly. Severe coronary artery calcification. Left lower lobe platelike atelectasis. Hepatobiliary: No focal liver abnormality. Cholelithiasis. No intra or extrahepatic biliary ductal dilatation. Pancreas: Unremarkable. No pancreatic ductal dilatation or surrounding inflammatory changes. Spleen: Normal in size without focal abnormality. Adrenals/Urinary Tract: Normal adrenal glands. Right upper kidney mass measuring 2.9 cm and left kidney interpolar mass measuring 1.6 cm are stable. Severe atrophy of kidneys. No hydronephrosis. Bladder is collapsed. Stomach/Bowel: Patent rectosigmoid and cecal region anastomosis. Pancolonic diverticulosis. The short segment of small bowel inflammatory changes with edema extending into the mesentery centered around a small focus  of air extending beyond the wall of the small bowel (series 5, image 29) which may represent a contained perforation or inflamed small bowel diverticulum. No bowel obstruction. Vascular/Lymphatic: Aortic atherosclerosis with severe calcific. No enlarged abdominal or pelvic lymph nodes. Reproductive: Status post hysterectomy. No adnexal masses. Other: Small volume of ascites within the left hemiabdomen. Musculoskeletal: No fracture is seen. IMPRESSION: 1. Short segment  of inflamed small bowel surrounding a small focus of air extending beyond the bowel wall which may represent a contained perforation or small bowel diverticulitis. Trace ascites is likely reactive. 2. Mild cardiomegaly and severe coronary artery calcification. 3. Cholelithiasis. 4. Stable right kidney upper pole 2.9 cm indeterminate mass and left kidney interpolar 1.6 cm indeterminate mass. Renal protocol CT with and without contrast is recommended to further characterize. This recommendation follows ACR consensus guidelines: Management of the Incidental Renal Mass on CT: A White Paper of the ACR Incidental Findings Committee. J Am Coll Radiol [phone]. 5. Pan colonic diverticulosis. Electronically Signed   By: Kristine Garbe M.D.   On: 03/22/2017 06:47   Dg Abdomen Acute W/chest  Result Date: 03/22/2017 CLINICAL DATA:  Diffuse abdominal pain. EXAM: DG ABDOMEN ACUTE W/ 1V CHEST COMPARISON:  Radiograph 02/08/2017.  CT 11/18/2016 FINDINGS: Right-sided dialysis catheter with tip in the distal SVC. Cardiomegaly. Post median sternotomy. Bibasilar atelectasis, right greater than left. No pulmonary edema or pleural fluid. No free intra-abdominal air. No bowel dilatation to suggest obstruction. Scattered retained barium within colonic diverticula. Multiple enteric sutures and surgical clips in the pelvis. Small volume of colonic stool. IMPRESSION: 1. Normal bowel gas pattern. No free air. Retained barium within colonic diverticulum. 2. Mild bibasilar atelectasis.  Mild cardiomegaly. Electronically Signed   By: Jeb Levering M.D.   On: 03/22/2017 06:00    ROS:  Pertinent items are noted in HPI.  Blood pressure (!) 157/86, pulse 72, temperature 98.4 F (36.9 C), temperature source Oral, resp. rate 16, height 5' 2"  (1.575 m), weight 126 lb (57.2 kg), SpO2 100 %. Physical Exam: Pleasant well-developed well-nourished black female in no acute distress. Head is normocephalic, atraumatic Neck  without JVD Lungs clear auscultation with equal breath sounds bilaterally. A permacath is in place in the right upper chest. Heart examination reveals a regular rate and rhythm without S3, S4, murmurs Abdomen is soft with nonspecific tenderness to palpation. No point tenderness is noted. No rigidity is noted. Rectal examination was deferred at this time  CT scan images personally reviewed  Assessment/Plan: Impression: Abdominal pain possibly secondary to enteritis. She either has erythema diverticulum or very focal perforation. That being said, she does not need acute surgical intervention. She has no leukocytosis. She is to undergo dialysis today. Have suggested Invanz for antibiotic coverage. She may be on sips of clears. Will advance diet pending further evaluation of her abdominal pain.  Aviva Signs 03/22/2017, 8:29 AM

## 2017-03-22 NOTE — ED Triage Notes (Signed)
Pt c/o generalized abd pain that started 2 hours ago. Pt is supposed to have dialysis this am. Last bm was today and normal.

## 2017-03-22 NOTE — Consult Note (Addendum)
Shawna [name] MRN: [id number] DOB/AGE: 1941/05/07 76 y.o. Primary Care Physician:Howard, Lennette Bihari, MD Admit date: 03/22/2017 Chief Complaint:  Chief Complaint  Patient presents with  . Abdominal Pain   HPI: Pt is 76 year old female withg past medical hx of ESRD who was presented to Er with c/o abdominal pain.  HPI dates back to last night when pt had sudden onset of abdominal pain, 101/10, no radiating, all over the abdomen. No c/o fever/cough/chiils NO c/o nausea/vomiting NO c/o hematemeis No c/o syncope No c/o recent travel. NO c/o blood in stools No c/o hematuria NO c/o black tarry stools. Pt says " my belly is so tender"  Pt husband in present in the room and voices no new concerns besides asking " when will she go for her tx"    Past medical hx  Reviewed ESRD HTN  Past Medical History:  Diagnosis Date  . Acute on chronic respiratory failure with hypoxia ([place]) 10/10/2016  . Anxiety   . Arthritis   . AVM (arteriovenous malformation) of colon   . CAD (coronary artery disease)    a. 12/2011 NSTEMI/Cath/PCI LCX (2.25x14 Resolute DES) & D1 (2.25x22 Resolute DES);  b. 01/2012 Cath/PCI: LM 30, LAD 30p, 40-39m, D1 stent ok, 99 in sm branch of diag, LCX patent stent, OM1 20, RCA 95 ost (4.0x12 Promus DES), EF 55%;  c. 04/2012 Lexi Cardiolite  EF 48%, small area of scar @ base/mid inflat wall with mild peri-infarct ischemia.; CABG 12/4  . Carotid artery disease ([place])    a. 78-93% LICA, 02/1016   . Chronic bronchitis ([place])   . Chronic diastolic CHF (congestive heart failure) ([place])    a. 02/2012 Echo EF 60-65%, nl wall motion, Gr 1 DD, mod MR  . Colon cancer ([place]) 1992  . Esophageal stricture   . ESRD on hemodialysis ([place])    ESRD due to HTN, started dialysis 2011 and [facility] with Dr Hinda Lenis on MWF schedule.  Access is LUA AVF as of Sept 2014.   Marland Kitchen GERD (gastroesophageal reflux disease)   . High cholesterol 12/2011  . History of blood transfusion 07/2011; 12/2011; 01/2012 X  2; 04/2012  . History of gout   . History of lower GI bleeding   . Hypertension   . Iron deficiency anemia   . Mitral regurgitation    a. Moderate by echo, 02/2012  . Myocardial infarction ([place])   . Ovarian cancer ([place]) 1992  . Pneumonia ~ 2009  . PUD (peptic ulcer disease)   . TIA (transient ischemic attack)       Family hx Reviewed NO hx of ESRD    Social History: denies smoking/drinking/illegal drug usage Lives with her husband.   Allergies:  reviewed       PZW:CHENI from the symptoms mentioned above,there are no other symptoms referable to all systems reviewed.  . heparin  5,000 Units Subcutaneous Q8H      Physical Exam: Vital signs in last 24 hours: Temp:  [98.4 F (36.9 C)] 98.4 F (36.9 C) (09/12 0821) Pulse Rate:  [69-72] 72 (09/12 0821) Resp:  [14-16] 16 (09/12 0500) BP: (157-174)/(54-86) 157/86 (09/12 0821) SpO2:  [98 %-100 %] 100 % (09/12 0821) Weight:  [126 lb (57.2 kg)] 126 lb (57.2 kg) (09/12 0447) Weight change:  Last BM Date: 03/21/17  Intake/Output from previous day: No intake/output data recorded. No intake/output data recorded.   Physical Exam: General- pt is awake,alert, oriented to time place and person Resp- No acute  REsp distress, rhonchi  at bases CVS- S1S2 regular in rate and rhythm GIT- BS+, soft, tenderness,non rigid EXT- NO LE Edema, NO Cyanosis CNS- CN 2-12 grossly intact. Moving all 4 extremities Psych- normal mood and affect Access-  PC.               Left AVG + bruit  Lab Results: CBC  Recent Labs  03/22/17 0602  WBC 8.6  HGB 13.5  HCT 42.8  PLT 260    BMET  Recent Labs  03/22/17 0602  NA 138  K 5.2*  CL 99*  CO2 25  GLUCOSE 96  BUN 36*  CREATININE 8.26*  CALCIUM 8.5*      Lab Results  Component Value Date   PTH 709.7 (H) 05/29/2013   CALCIUM 8.5 (L) 03/22/2017   CAION 1.06 (L) 07/26/2016   PHOS 5.8 (H) 02/09/2017     Albumin=4.0 Corrected calcium  8.5     Impression: 1)Renal ESRd on HD Pt is  On Monday/Wednesday/Friday schedule as outpt Pt will  dialyzed be today  2)HTN  BP  near to goal     3)Anemia in ESRD the goal for  HGb is  9--11. Pt HGb  at goal No need of Epo  4)CKD Mineral-Bone Disorder Secondary Hyperparathyroidism present    On Cinacalcet Phosphorus not at goal.     On binders Calcium is just below  Goal      HD should help  5)GI-Admitted with abdominal pain Surgery and  Primary MD following  6)Electrolytes  Hyperkalemic  normonatremic   7)Acid base Co2 at goal     Plan:  Will use 2 k/2.5 cal  bath NO need of epo as hgb more than 12 Will not give heparin   Addendum Pt seen on Hd Pt tolerating tx well.  Elizet Kaplan S 03/22/2017, 10:22 AM

## 2017-03-22 NOTE — H&P (Addendum)
History and Physical    SHAYLIN BLATT YWV:[id number] DOB: Jan 31, 1941 DOA: 03/22/2017  Referring MD/NP/PA: Dr. [name]  PCP: [name], MD   Patient coming from: Home  Chief Complaint: abd pain  HPI: Shawna [name] is a 76 y.o. female with multiple, chronic medical conditions including ESRD on HD MWF, CAD, NSTEMI in 2013, hypertension, diastolic CHF, history of GI bleed, AVM requiring blood transfusions, now presented with main concern of 2 days duration of progressively worsening lower quadrants abdominal discomfort. Patient explains that 2 days prior to this admission she had watery diarrhea, last episode of diarrhea was about 24 hours ago. This was associated with some nausea but no vomiting. Patient describes pain as intermittent, mixed sharp and dull, initially 10/10 in severity but now down to 3-4/10 in severity, occasionally but not consistently radiating across the entire abdomen, worse with deep breaths and movement, no specific alleviating factors. Patient reports somewhat similar episode in the past but not as severe as this one. Patient denies recent sick contacts or exposures, has not eaten anywhere outside, no fevers or chills, no specific urinary concerns. Patient also denies chest pains or dyspnea.  ED Course: In emergency department, patient noted to be hemodynamically stable, vital signs notable for blood pressure 174/61, repeat blood pressure 157/86, vital signs otherwise stable. Blood work notable for potassium 5.2. Of note, patient had hemodialysis on September 11th, as per patient's report not enough fluid was removed with regular session on 03/20/2017. CT abdomen and pelvis with questionable contained perforation versus small bowel diverticulitis. Patient started on intravenous antibiotic. Surgery team was consulted and TRH asked to admit for further evaluation.  Review of Systems:  Constitutional: Negative for fever, chills, diaphoresis, activity change HENT: Negative for  ear pain, nosebleeds, congestion, facial swelling, rhinorrhea, neck pain, neck stiffness and ear discharge.   Eyes: Negative for pain, discharge, redness, itching and visual disturbance.  Respiratory: Negative for cough, choking, chest tightness, shortness of breath, wheezing and stridor.   Cardiovascular: Negative for chest pain, palpitations and leg swelling.  Gastrointestinal: Per history of present illness Genitourinary: Negative for dysuria, urgency, frequency, hematuria, flank pain, decreased urine volume, difficulty urinating and dyspareunia.  Musculoskeletal: Negative for back pain, joint swelling, arthralgias and gait problem.  Neurological: Negative for dizziness, tremors, seizures, syncope, facial asymmetry, speech difficulty, weakness, light-headedness, numbness and headaches.  Hematological: Negative for adenopathy. Does not bruise/bleed easily.  Psychiatric/Behavioral: Negative for hallucinations, behavioral problems, confusion, dysphoric mood, decreased concentration and agitation.   Past Medical History:  Diagnosis Date  . Acute on chronic respiratory failure with hypoxia ([place]) 10/10/2016  . Anxiety   . Arthritis   . AVM (arteriovenous malformation) of colon   . CAD (coronary artery disease)    a. 12/2011 NSTEMI/Cath/PCI LCX (2.25x14 Resolute DES) & D1 (2.25x22 Resolute DES);  b. 01/2012 Cath/PCI: LM 30, LAD 30p, 40-47m, D1 stent ok, 99 in sm branch of diag, LCX patent stent, OM1 20, RCA 95 ost (4.0x12 Promus DES), EF 55%;  c. 04/2012 Lexi Cardiolite  EF 48%, small area of scar @ base/mid inflat wall with mild peri-infarct ischemia.; CABG 12/4  . Carotid artery disease ([place])    a. 85-46% LICA, 08/7033   . Chronic bronchitis ([place])   . Chronic diastolic CHF (congestive heart failure) ([place])    a. 02/2012 Echo EF 60-65%, nl wall motion, Gr 1 DD, mod MR  . Colon cancer ([place]) 1992  . Esophageal stricture   . ESRD on hemodialysis ([place])  ESRD due to HTN, started dialysis 2011 and  gets HD at [facility] with Dr Hinda Lenis on MWF schedule.  Access is LUA AVF as of Sept 2014.   [place] Kitchen GERD (gastroesophageal reflux disease)   . High cholesterol 12/2011  . History of blood transfusion 07/2011; 12/2011; 01/2012 X 2; 04/2012  . History of gout   . History of lower GI bleeding   . Hypertension   . Iron deficiency anemia   . Mitral regurgitation    a. Moderate by echo, 02/2012  . Myocardial infarction ([place])   . Ovarian cancer ([place]) 1992  . Pneumonia ~ 2009  . PUD (peptic ulcer disease)   . TIA (transient ischemic attack)     Past Surgical History:  Procedure Laterality Date  . ABDOMINAL HYSTERECTOMY  1992  . APPENDECTOMY  06/1990  . AV FISTULA PLACEMENT  07/2009   left upper arm  . AV FISTULA PLACEMENT Right 09/06/2016   Procedure: RIGHT FOREARM ARTERIOVENOUS (AV) GRAFT;  Surgeon: Elam Dutch, MD;  Location: [facility] OR;  Service: Vascular;  Laterality: Right;  . AV FISTULA PLACEMENT N/A 02/24/2017   Procedure: INSERTION OF ARTERIOVENOUS (AV) GORE-TEX GRAFT ARM (BRACHIAL AXILLARY);  Surgeon: Katha Cabal, MD;  Location: [facility] ORS;  Service: Vascular;  Laterality: N/A;  . [place] Right 09/06/2016   Procedure: REMOVAL OF Right Arm ARTERIOVENOUS GORETEX GRAFT and Vein Patch angioplasty of brachial artery;  Surgeon: Angelia Mould, MD;  Location: [place];  Service: Vascular;  Laterality: Right;  . COLON RESECTION  1992  . COLON SURGERY    . CORONARY ANGIOPLASTY WITH STENT PLACEMENT  12/15/11   "2"  . CORONARY ANGIOPLASTY WITH STENT PLACEMENT  y/2013   "1; makes total of 3" (05/02/2012)  . CORONARY ARTERY BYPASS GRAFT  06/13/2012   Procedure: CORONARY ARTERY BYPASS GRAFTING (CABG);  Surgeon: Grace Isaac, MD;  Location: [place];  Service: Open Heart Surgery;  Laterality: N/A;  cabg x four;  using left internal mammary artery, and left leg greater saphenous vein harvested endoscopically  . CORONARY STENT INTERVENTION N/A 10/13/2016   Procedure: Coronary Stent Intervention;   Surgeon: Troy Sine, MD;  Location: [place] CV LAB;  Service: Cardiovascular;  Laterality: N/A;  . DILATION AND CURETTAGE OF UTERUS    . ESOPHAGOGASTRODUODENOSCOPY  01/20/2012   Procedure: ESOPHAGOGASTRODUODENOSCOPY (EGD);  Surgeon: Ladene Artist, MD,FACG;  Location: [facility] ENDOSCOPY;  Service: Endoscopy;  Laterality: N/A;  . ESOPHAGOGASTRODUODENOSCOPY N/A 03/26/2013   Procedure: ESOPHAGOGASTRODUODENOSCOPY (EGD);  Surgeon: Irene Shipper, MD;  Location: [facility] ENDOSCOPY;  Service: Endoscopy;  Laterality: N/A;  . ESOPHAGOGASTRODUODENOSCOPY N/A 04/30/2015   Procedure: ESOPHAGOGASTRODUODENOSCOPY (EGD);  Surgeon: Rogene Houston, MD;  [facility]O [facility];  Service: Endoscopy;  Laterality: N/A;  1pm - moved to 10/20 @ 1:10  . ESOPHAGOGASTRODUODENOSCOPY N/A 07/29/2016   Procedure: ESOPHAGOGASTRODUODENOSCOPY (EGD);  Surgeon: Manus Gunning, MD;  Location: [place];  Service: Gastroenterology;  Laterality: N/A;  enteroscopy  . INTRAOPERATIVE TRANSESOPHAGEAL ECHOCARDIOGRAM  06/13/2012   Procedure: INTRAOPERATIVE TRANSESOPHAGEAL ECHOCARDIOGRAM;  Surgeon: Grace Isaac, MD;  Location: [place];  Service: Open Heart Surgery;  Laterality: N/A;  . IR GENERIC HISTORICAL  07/26/2016   IR FLUORO GUIDE CV LINE RIGHT 07/26/2016 Greggory Keen, MD [facility]INTER[facility]  . IR GENERIC HISTORICAL  07/26/2016   IR US GUIDE VASC ACCESS RIGHT 07/26/2016 Greggory Keen, MD [facility]  . IR GENERIC HISTORICAL  08/02/2016   IR US GUIDE VASC ACCESS RIGHT 08/02/2016 Greggory Keen, MD  [facility]  . IR GENERIC HISTORICAL  08/02/2016   IR FLUORO GUIDE CV LINE RIGHT 08/02/2016 Greggory Keen, MD [facility]  . LEFT HEART CATH AND CORONARY ANGIOGRAPHY N/A 09/20/2016   Procedure: Left Heart Cath and Coronary Angiography;  Surgeon: [name], MD;  Location: [place] CV LAB;  Service: Cardiovascular;  Laterality: N/A;  . LEFT HEART CATH AND CORS/GRAFTS ANGIOGRAPHY N/A 10/13/2016   Procedure: Left Heart Cath and Cors/Grafts  Angiography;  Surgeon: Troy Sine, MD;  Location: [place] CV LAB;  Service: Cardiovascular;  Laterality: N/A;  . LEFT HEART CATHETERIZATION WITH CORONARY ANGIOGRAM N/A 12/15/2011   Procedure: LEFT HEART CATHETERIZATION WITH CORONARY ANGIOGRAM;  Surgeon: [place] Blanks, MD;  Location: [facility] CATH LAB;  Service: Cardiovascular;  Laterality: N/A;  . LEFT HEART CATHETERIZATION WITH CORONARY ANGIOGRAM N/A 01/10/2012   Procedure: LEFT HEART CATHETERIZATION WITH CORONARY ANGIOGRAM;  Surgeon: Peter M Martinique, MD;  Location: [facility] CATH LAB;  Service: Cardiovascular;  Laterality: N/A;  . LEFT HEART CATHETERIZATION WITH CORONARY ANGIOGRAM N/A 06/08/2012   Procedure: LEFT HEART CATHETERIZATION WITH CORONARY ANGIOGRAM;  Surgeon: [place] Blanks, MD;  Location: [facility] CATH LAB;  Service: Cardiovascular;  Laterality: N/A;  . LEFT HEART CATHETERIZATION WITH CORONARY/GRAFT ANGIOGRAM N/A 12/10/2013   Procedure: LEFT HEART CATHETERIZATION WITH Beatrix Fetters;  Surgeon: [name], MD;  Location: [facility] CATH LAB;  Service: Cardiovascular;  Laterality: N/A;  . OVARY SURGERY     ovarian cancer  . REVISION OF ARTERIOVENOUS GORETEX GRAFT N/A 02/24/2017   Procedure: REVISION OF ARTERIOVENOUS GORETEX GRAFT (RESECTION);  Surgeon: Katha Cabal, MD;  Location: [facility] ORS;  Service: Vascular;  Laterality: N/A;  [place] N/A 10/15/2013   Procedure: Fistulogram;  Surgeon: Serafina Mitchell, MD;  Location: [facility] CATH LAB;  Service: Cardiovascular;  Laterality: N/A;  . THROMBECTOMY / ARTERIOVENOUS GRAFT REVISION  2011   left upper arm  . TUBAL LIGATION  1980's  . UPPER EXTREMITY ANGIOGRAPHY Bilateral 12/06/2016   Procedure: Upper Extremity Angiography;  Surgeon: Katha Cabal, MD;  Location: [place] CV LAB;  Service: Cardiovascular;  Laterality: Bilateral;   Social history:  reports that she has never smoked. She has never used smokeless tobacco. She reports that she does not drink alcohol or use  drugs.  Allergies  Allergen Reactions  . Aspirin Other (See Comments)    High Doses Mess up her stomach; "makes my bowels have blood in them". Takes 81 mg EC Aspirin   . Penicillins Other (See Comments)    SYNCOPE? , "makes me real weak when I take it; like I'll pass out"  Has patient had a PCN reaction causing immediate rash, facial/tongue/throat swelling, SOB or lightheadedness with hypotension: Yes Has patient had a PCN reaction causing severe rash involving mucus membranes or skin necrosis: no Has patient had a PCN reaction that required hospitalization no Has patient had a PCN reaction occurring within the last 10 years: no If all of the above  . Amlodipine Swelling  . Bactrim [Sulfamethoxazole-Trimethoprim] Rash  . Contrast Media [Iodinated Diagnostic Agents] Itching  . Iron Itching and Other (See Comments)    "they gave me iron in dialysis; had to give me Benadryl cause I had to have the iron" (05/02/2012)  . Nitrofurantoin Hives  . Tylenol [Acetaminophen] Itching and Other (See Comments)    Makes her feet on fire per pt  . Gabapentin Other (See Comments)    unspecified  . Dexilant [Dexlansoprazole] Other (See Comments)  Upset stomach  . Levaquin [Levofloxacin In D5w] Rash  . Morphine And Related Itching    Itching in feet  . Plavix [Clopidogrel Bisulfate] Rash  . Protonix [Pantoprazole Sodium] Rash  . Venofer [Ferric Oxide] Itching    Patient reports using Benadryl prior to doses as [place]    Family History  Problem Relation Age of Onset  . Heart disease Mother        Heart Disease before age 50  . Hyperlipidemia Mother   . Hypertension Mother   . Diabetes Mother   . Heart attack Mother   . Heart disease Father        Heart Disease before age 10  . Hyperlipidemia Father   . Hypertension Father   . Diabetes Father   . Diabetes Sister   . Hypertension Sister   . Diabetes Brother   . Hyperlipidemia Brother   . Heart attack Brother   . Hypertension  Sister   . Heart attack Brother   . Other Unknown        noncontributory for early CAD  . Colon cancer Neg Hx   . Esophageal cancer Neg Hx   . Liver disease Neg Hx   . Kidney disease Neg Hx   . Colon polyps Neg Hx     Prior to Admission medications   Medication Sig Start Date End Date Taking? Authorizing Provider  ALPRAZolam (XANAX) 0.25 MG tablet Take 0.25 mg by mouth 2 (two) times daily as needed for anxiety or sleep.  11/19/15   [provider]  amiodarone (PACERONE) 200 MG tablet Take 1 tablet (200 mg total) by mouth daily. 12/08/16   Herminio Commons, MD  aspirin EC 81 MG tablet Take 81 mg by mouth daily.     [provider]  carvedilol (COREG) 6.25 MG tablet Take 1 tablet (6.25 mg total) by mouth 2 (two) times daily with a meal. 11/16/16   Herminio Commons, MD  cinacalcet (SENSIPAR) 30 MG tablet Take 30 mg by mouth daily after supper.    [provider]  cloNIDine (CATAPRES) 0.2 MG tablet Take 1 tablet (0.2 mg total) by mouth 3 (three) times daily. Patient taking differently: Take 0.2 mg by mouth 2 (two) times daily as needed (high blood pressure).  12/08/16   Herminio Commons, MD  diphenhydrAMINE (BENADRYL) 25 mg capsule Take 50 mg by mouth daily as needed for itching or allergies.     [provider]  epoetin alfa (EPOGEN,PROCRIT) 43329 UNIT/ML injection Inject 1 mL (10,000 Units total) into the vein every Monday, Wednesday, and Friday with hemodialysis. 04/11/16   Orvan Falconer, MD  fluticasone (FLONASE) 50 MCG/ACT nasal spray Place 2 sprays into the nose at bedtime as needed for allergies.     [provider]  hydrALAZINE (APRESOLINE) 50 MG tablet Take 2 tablets (100 mg total) by mouth 3 (three) times daily. Patient taking differently: Take 100 mg by mouth 2 (two) times daily as needed (for high blood pressure readings).  11/10/16 02/20/17  Herminio Commons, MD  isosorbide mononitrate (IMDUR) 120 MG 24 hr tablet Take 1 tablet (120 mg  total) by mouth daily. 01/21/17   Caren Griffins, MD  loratadine (CLARITIN) 10 MG tablet Take 10 mg by mouth daily as needed for allergies.     [provider]  multivitamin (RENA-VIT) TABS tablet Take 1 tablet by mouth daily.    [provider]  nitroGLYCERIN (NITROSTAT) 0.4 MG SL tablet Place 1  tablet (0.4 mg total) under the tongue every 5 (five) minutes as needed for chest pain. 08/02/16   Kinnie Feil, MD  omeprazole (PRILOSEC) 20 MG capsule Take 20 mg by mouth daily.  09/12/16   [provider]  ondansetron (ZOFRAN-ODT) 4 MG disintegrating tablet Take 4 mg by mouth 2 (two) times daily as needed for nausea or vomiting.    [provider]  oxycodone (OXY-IR) 5 MG capsule Take 1 capsule (5 mg total) by mouth every 6 (six) hours as needed for pain. 02/24/17   Schnier, Dolores Lory, MD  sevelamer carbonate (RENVELA) 800 MG tablet Take 800 mg by mouth 3 (three) times daily with meals.    [provider]  simvastatin (ZOCOR) 20 MG tablet TAKE ONE TABLET BY MOUTH AT BEDTIME 02/13/17   Herminio Commons, MD  ticagrelor (BRILINTA) 90 MG TABS tablet Take 90 mg by mouth 2 (two) times daily.    [provider]    Physical Exam: Vitals:   03/22/17 0447 03/22/17 0454 03/22/17 0500  BP:  (!) 174/61 (!) 174/54  Pulse:  69 70  Resp:  14 16  Temp:  98.4 F (36.9 C)   TempSrc:  Oral   SpO2:  98% 99%  Weight: 57.2 kg (126 lb)    Height: 5\' 2"  (1.575 m)      Constitutional: NAD, calm Vitals:   03/22/17 0447 03/22/17 0454 03/22/17 0500  BP:  (!) 174/61 (!) 174/54  Pulse:  69 70  Resp:  14 16  Temp:  98.4 F (36.9 C)   TempSrc:  Oral   SpO2:  98% 99%  Weight: 57.2 kg (126 lb)    Height: 5\' 2"  (1.575 m)     Eyes: PERRL, lids and conjunctivae normal ENMT: Mucous membranes are moist. Posterior pharynx clear of any exudate or lesions. Neck: normal, supple, no masses, no thyromegaly Respiratory: Normal respiratory effort. No accessory muscle  use. Mild rhonchi at bases Cardiovascular: Regular rate and rhythm, no rubs / gallops. No extremity edema. 2+ pedal pulses. No carotid bruits.  Abdomen: mild epigastric tenderness, no guarding, no rebound tenderness, BD present  Musculoskeletal: no clubbing / cyanosis. No joint deformity upper and lower extremities. L AVG + bruit  Skin: no rashes, lesions, ulcers. No induration Neurologic: CN 2-12 grossly intact. Sensation intact, DTR normal. Strength 5/5 in all 4.  Psychiatric: Normal judgment and insight. Alert and oriented x 3. Normal mood.   Labs on Admission: I have personally reviewed following labs and imaging studies  CBC:  Recent Labs Lab 03/22/17 0602  WBC 8.6  NEUTROABS 7.2  HGB 13.5  HCT 42.8  MCV 105.2*  PLT 696   Basic Metabolic Panel:  Recent Labs Lab 03/22/17 0602  NA 138  K 5.2*  CL 99*  CO2 25  GLUCOSE 96  BUN 36*  CREATININE 8.26*  CALCIUM 8.5*   Liver Function Tests:  Recent Labs Lab 03/22/17 0602  AST 19  ALT 14  ALKPHOS 104  BILITOT 0.5  PROT 7.4  ALBUMIN 4.0    Recent Labs Lab 03/22/17 0602  LIPASE 31   Urine analysis:    Component Value Date/Time   COLORURINE YELLOW 12/16/2012 1919   APPEARANCEUR CLOUDY (A) 12/16/2012 1919   LABSPEC 1.009 12/16/2012 1919   PHURINE 7.5 12/16/2012 1919   GLUCOSEU NEGATIVE 12/16/2012 1919   HGBUR TRACE (A) 12/16/2012 1919   BILIRUBINUR NEGATIVE 12/16/2012 1919   KETONESUR NEGATIVE 12/16/2012 1919   PROTEINUR 100 (A) 12/16/2012  1919   UROBILINOGEN 0.2 12/16/2012 1919   NITRITE NEGATIVE 12/16/2012 1919   LEUKOCYTESUR SMALL (A) 12/16/2012 1919    Radiological Exams on Admission: Ct Abdomen Pelvis Wo Contrast  Result Date: 03/22/2017 CLINICAL DATA:  76 y/o F; generalized abdominal pain and abdominal distention. EXAM: CT ABDOMEN AND PELVIS WITHOUT CONTRAST TECHNIQUE: Multidetector CT imaging of the abdomen and pelvis was performed following the standard protocol without IV contrast. COMPARISON:   11/18/2016 CT of abdomen and pelvis. FINDINGS: Lower chest: Mild cardiomegaly. Severe coronary artery calcification. Left lower lobe platelike atelectasis. Hepatobiliary: No focal liver abnormality. Cholelithiasis. No intra or extrahepatic biliary ductal dilatation. Pancreas: Unremarkable. No pancreatic ductal dilatation or surrounding inflammatory changes. Spleen: Normal in size without focal abnormality. Adrenals/Urinary Tract: Normal adrenal glands. Right upper kidney mass measuring 2.9 cm and left kidney interpolar mass measuring 1.6 cm are stable. Severe atrophy of kidneys. No hydronephrosis. Bladder is collapsed. Stomach/Bowel: Patent rectosigmoid and cecal region anastomosis. Pancolonic diverticulosis. The short segment of small bowel inflammatory changes with edema extending into the mesentery centered around a small focus of air extending beyond the wall of the small bowel (series 5, image 29) which may represent a contained perforation or inflamed small bowel diverticulum. No bowel obstruction. Vascular/Lymphatic: Aortic atherosclerosis with severe calcific. No enlarged abdominal or pelvic lymph nodes. Reproductive: Status post hysterectomy. No adnexal masses. Other: Small volume of ascites within the left hemiabdomen. Musculoskeletal: No fracture is seen. IMPRESSION: 1. Short segment of inflamed small bowel surrounding a small focus of air extending beyond the bowel wall which may represent a contained perforation or small bowel diverticulitis. Trace ascites is likely reactive. 2. Mild cardiomegaly and severe coronary artery calcification. 3. Cholelithiasis. 4. Stable right kidney upper pole 2.9 cm indeterminate mass and left kidney interpolar 1.6 cm indeterminate mass. Renal protocol CT with and without contrast is recommended to further characterize. This recommendation follows ACR consensus guidelines: Management of the Incidental Renal Mass on CT: A White Paper of the ACR Incidental Findings  Committee. J Am Coll Radiol [phone]. 5. Pan colonic diverticulosis. Electronically Signed   By: Kristine Garbe M.D.   On: 03/22/2017 06:47   Dg Abdomen Acute W/chest  Result Date: 03/22/2017 CLINICAL DATA:  Diffuse abdominal pain. EXAM: DG ABDOMEN ACUTE W/ 1V CHEST COMPARISON:  Radiograph 02/08/2017.  CT 11/18/2016 FINDINGS: Right-sided dialysis catheter with tip in the distal SVC. Cardiomegaly. Post median sternotomy. Bibasilar atelectasis, right greater than left. No pulmonary edema or pleural fluid. No free intra-abdominal air. No bowel dilatation to suggest obstruction. Scattered retained barium within colonic diverticula. Multiple enteric sutures and surgical clips in the pelvis. Small volume of colonic stool. IMPRESSION: 1. Normal bowel gas pattern. No free air. Retained barium within colonic diverticulum. 2. Mild bibasilar atelectasis.  Mild cardiomegaly. Electronically Signed   By: Jeb Levering M.D.   On: 03/22/2017 06:00    EKG: pending   Assessment/Plan  Lower quadrants abdominal pain - Unclear exact etiology, questionable enteritis, focal perforation - Appreciate surgeon Dr. Arnoldo Morale assistance - Recommendation is to start with conservative management, antibiotic coverage Invanz, analgesia and antiemetics as needed - Start with sips of clear liquid diet and advance as clinically indicated  End-stage renal disease, hyperkalemia - On hemodialysis Monday Wednesday and Friday - Appreciate assistance of nephrologist Dr. Theador Hawthorne - Address potassium with dialysis  Hypertension, Essential - At home taking Coreg, Imdur scheduled, also taking clonidine and hydralazine as needed - We'll continue home regimen with Coreg and Imdur - Add hydralazine IV as  needed for systolic blood pressure > 155  Anemia of chronic disease, ESRD - Goal hemoglobin 9-11 - No need for Depo at this time  CAD, NSTEMI in 2013 - Continue aspirin - Patient also on amiodarone, Brilinta    Renal mass - noted on right and left kidney - once acute problem resolved, can proceed with further evaluation with renal protocol CT  DVT prophylaxis: Heparin SQ Code Status: Full  Family Communication: Pt and husband updated at bedside Disposition Plan: admit to tele unit Consults called: Surgery and nephrology  Admission status: Inpatient   Faye Ramsay MD [facility] Pager [phone]  If 7PM-7AM, please contact night-coverage [email or web address] Password [facility]  03/22/2017, 7:14 AM

## 2017-03-22 NOTE — Progress Notes (Signed)
[place], RN (HD nurse) reported to this Probation officer that she noted that the patient's LEFT arm, hand and fingers were warm at the proximal point and progressively cooler towards the distal end of patient's LEFT arm/hand. Upon observation, LEFT arm noted warmer at the proximal portion and slightly cooler at the distal portion. Tips of LEFT index and middle fingers noted white in color and cool. Slow cap refill noted to all fingers. Patient c/o numbness and pain in LEFT hand. Patient has recent new AVF graft placement to LEFT UE. MD notified and new order for LEFT UE doppler ordered. Patient updated.

## 2017-03-23 ENCOUNTER — Inpatient Hospital Stay (HOSPITAL_COMMUNITY): Payer: Medicare HMO

## 2017-03-23 LAB — BASIC METABOLIC PANEL
Anion gap: 9 (ref 5–15)
BUN: 22 mg/dL — AB (ref 6–20)
CHLORIDE: 97 mmol/L — AB (ref 101–111)
CO2: 30 mmol/L (ref 22–32)
Calcium: 7.9 mg/dL — ABNORMAL LOW (ref 8.9–10.3)
Creatinine, Ser: 5.84 mg/dL — ABNORMAL HIGH (ref 0.44–1.00)
GFR calc Af Amer: 7 mL/min — ABNORMAL LOW (ref >60)
GFR calc non Af Amer: 6 mL/min — ABNORMAL LOW (ref >60)
GLUCOSE: 71 mg/dL (ref 65–99)
POTASSIUM: 4.1 mmol/L (ref 3.5–5.1)
Sodium: 136 mmol/L (ref 135–145)

## 2017-03-23 LAB — CBC
HCT: 33.9 % — ABNORMAL LOW (ref 36.0–46.0)
Hemoglobin: 10.8 g/dL — ABNORMAL LOW (ref 12.0–15.0)
MCH: 33.4 pg (ref 26.0–34.0)
MCHC: 31.9 g/dL (ref 30.0–36.0)
MCV: 105 fL — AB (ref 78.0–100.0)
Platelets: 201 10*3/uL (ref 150–400)
RBC: 3.23 MIL/uL — ABNORMAL LOW (ref 3.87–5.11)
RDW: 18.4 % — ABNORMAL HIGH (ref 11.5–15.5)
WBC: 5.3 10*3/uL (ref 4.0–10.5)

## 2017-03-23 LAB — HEPATITIS B SURFACE ANTIGEN: Hepatitis B Surface Ag: NEGATIVE

## 2017-03-23 MED ORDER — SENNOSIDES-DOCUSATE SODIUM 8.6-50 MG PO TABS: 1.0000 | ORAL_TABLET | Freq: Two times a day (BID) | ORAL | Status: DC

## 2017-03-23 MED ORDER — SODIUM CHLORIDE 0.9 % IV SOLN
1.0000 g | INTRAVENOUS | Status: DC
  Administered 2017-03-24: 1 g via INTRAVENOUS
  Filled 2017-03-23 (×2): qty 1

## 2017-03-23 MED ORDER — BISACODYL 10 MG RE SUPP
10.0000 mg | Freq: Two times a day (BID) | RECTAL | Status: DC
  Administered 2017-03-25 – 2017-04-03 (×7): 10 mg via RECTAL
  Filled 2017-03-23 (×12): qty 1

## 2017-03-23 MED ORDER — BISACODYL 10 MG RE SUPP
10.0000 mg | Freq: Every day | RECTAL | Status: DC | PRN
  Administered 2017-03-23: 10 mg via RECTAL
  Filled 2017-03-23: qty 1

## 2017-03-23 MED ORDER — FAMOTIDINE 20 MG PO TABS
20.0000 mg | ORAL_TABLET | Freq: Two times a day (BID) | ORAL | Status: DC
  Administered 2017-03-23 (×2): 20 mg via ORAL
  Filled 2017-03-23 (×2): qty 1

## 2017-03-23 NOTE — Progress Notes (Signed)
Patient's BP 118/33 and 108/41.  Paged Dr Doyle Askew who gave order to hold this dose of Imdur but give Amiodarone.

## 2017-03-23 NOTE — Care Management Note (Addendum)
Case Management Note  Patient Details  Name: Shawna [name] MRN: [id number] Date of Birth: [birth year]  Subjective/Objective:        From home with spouse,  Uses a cane at home , has PCP ( Dr. Nadara Mustard), she has insurance with medication coverage. She is MWF schedule for dialysis. Has had home health in the past.   Action/Plan: Anticipate DC home. CM following for needs.   Expected Discharge Date:  03/25/17               Expected Discharge Plan:     In-House Referral:     Discharge planning Services  CM Consult  Post Acute Care Choice:    Choice offered to:     DME Arranged:    DME Agency:     HH Arranged:    HH Agency:     Status of Service:  In process, will continue to follow  If discussed at Long Length of Stay Meetings, dates discussed:    Additional Comments:  Jaylia Pettus, Chauncey Reading, RN 03/23/2017, 1:42 PM

## 2017-03-23 NOTE — Progress Notes (Signed)
Subjective: States her abdominal pain has significantly decreased, but is still present sporadically. No nausea or vomiting have been noted. She has not had a bowel movement yet.  Objective: Vital signs in last 24 hours: Temp:  [97.8 F (36.6 C)-99.9 F (37.7 C)] 97.8 F (36.6 C) (09/13 8329) Pulse Rate:  [65-98] 71 (09/13 0614) Resp:  [18-20] 20 (09/13 0614) BP: (92-223)/(44-98) 123/44 (09/13 0614) SpO2:  [100 %] 100 % (09/13 0614) Weight:  [126 lb 12.2 oz (57.5 kg)-129 lb 13.6 oz (58.9 kg)] 129 lb 13.6 oz (58.9 kg) (09/13 1916) Last BM Date: 03/21/17  Intake/Output from previous day: No intake/output data recorded. Intake/Output this shift: No intake/output data recorded.  General appearance: alert, cooperative and no distress GI: soft, non-tender; bowel sounds normal; no masses,  no organomegaly  Lab Results:   Recent Labs  03/22/17 0602 03/23/17 0519  WBC 8.6 5.3  HGB 13.5 10.8*  HCT 42.8 33.9*  PLT 260 201   BMET  Recent Labs  03/22/17 0602 03/23/17 0519  NA 138 136  K 5.2* 4.1  CL 99* 97*  CO2 25 30  GLUCOSE 96 71  BUN 36* 22*  CREATININE 8.26* 5.84*  CALCIUM 8.5* 7.9*   PT/INR No results for input(s): LABPROT, INR in the last 72 hours.  Studies/Results: Ct Abdomen Pelvis Wo Contrast  Result Date: 03/22/2017 CLINICAL DATA:  76 y/o F; generalized abdominal pain and abdominal distention. EXAM: CT ABDOMEN AND PELVIS WITHOUT CONTRAST TECHNIQUE: Multidetector CT imaging of the abdomen and pelvis was performed following the standard protocol without IV contrast. COMPARISON:  11/18/2016 CT of abdomen and pelvis. FINDINGS: Lower chest: Mild cardiomegaly. Severe coronary artery calcification. Left lower lobe platelike atelectasis. Hepatobiliary: No focal liver abnormality. Cholelithiasis. No intra or extrahepatic biliary ductal dilatation. Pancreas: Unremarkable. No pancreatic ductal dilatation or surrounding inflammatory changes. Spleen: Normal in size  without focal abnormality. Adrenals/Urinary Tract: Normal adrenal glands. Right upper kidney mass measuring 2.9 cm and left kidney interpolar mass measuring 1.6 cm are stable. Severe atrophy of kidneys. No hydronephrosis. Bladder is collapsed. Stomach/Bowel: Patent rectosigmoid and cecal region anastomosis. Pancolonic diverticulosis. The short segment of small bowel inflammatory changes with edema extending into the mesentery centered around a small focus of air extending beyond the wall of the small bowel (series 5, image 29) which may represent a contained perforation or inflamed small bowel diverticulum. No bowel obstruction. Vascular/Lymphatic: Aortic atherosclerosis with severe calcific. No enlarged abdominal or pelvic lymph nodes. Reproductive: Status post hysterectomy. No adnexal masses. Other: Small volume of ascites within the left hemiabdomen. Musculoskeletal: No fracture is seen. IMPRESSION: 1. Short segment of inflamed small bowel surrounding a small focus of air extending beyond the bowel wall which may represent a contained perforation or small bowel diverticulitis. Trace ascites is likely reactive. 2. Mild cardiomegaly and severe coronary artery calcification. 3. Cholelithiasis. 4. Stable right kidney upper pole 2.9 cm indeterminate mass and left kidney interpolar 1.6 cm indeterminate mass. Renal protocol CT with and without contrast is recommended to further characterize. This recommendation follows ACR consensus guidelines: Management of the Incidental Renal Mass on CT: A White Paper of the ACR Incidental Findings Committee. J Am Coll Radiol [id number]. 5. Pan colonic diverticulosis. Electronically Signed   By: Kristine Garbe M.D.   On: 03/22/2017 06:47   Dg Abdomen Acute W/chest  Result Date: 03/22/2017 CLINICAL DATA:  Diffuse abdominal pain. EXAM: DG ABDOMEN ACUTE W/ 1V CHEST COMPARISON:  Radiograph 02/08/2017.  CT 11/18/2016 FINDINGS: Right-sided dialysis catheter with tip  in  the distal SVC. Cardiomegaly. Post median sternotomy. Bibasilar atelectasis, right greater than left. No pulmonary edema or pleural fluid. No free intra-abdominal air. No bowel dilatation to suggest obstruction. Scattered retained barium within colonic diverticula. Multiple enteric sutures and surgical clips in the pelvis. Small volume of colonic stool. IMPRESSION: 1. Normal bowel gas pattern. No free air. Retained barium within colonic diverticulum. 2. Mild bibasilar atelectasis.  Mild cardiomegaly. Electronically Signed   By: Jeb Levering M.D.   On: 03/22/2017 06:00    Anti-infectives: Anti-infectives    Start     Dose/Rate Route Frequency Ordered Stop   03/24/17 1200  ertapenem (INVANZ) 1 g in sodium chloride 0.9 % 50 mL IVPB     1 g 100 mL/hr over 30 Minutes Intravenous Every M-W-F (Hemodialysis) 03/23/17 0830     03/22/17 0745  ertapenem (INVANZ) 1 g in sodium chloride 0.9 % 50 mL IVPB     1 g 100 mL/hr over 30 Minutes Intravenous  Once 03/22/17 0735     03/22/17 0715  clindamycin (CLEOCIN) IVPB 600 mg  Status:  Discontinued     600 mg 100 mL/hr over 30 Minutes Intravenous  Once 03/22/17 0700 03/22/17 0748   03/22/17 0700  metroNIDAZOLE (FLAGYL) IVPB 500 mg  Status:  Discontinued     500 mg 100 mL/hr over 60 Minutes Intravenous  Once 03/22/17 0657 03/22/17 0658   03/22/17 0700  clindamycin (CLEOCIN) IVPB 600 mg  Status:  Discontinued     600 mg 100 mL/hr over 30 Minutes Intravenous  Once 03/22/17 0658 03/22/17 0700      Assessment/Plan: Impression: Enteritis. Somewhat improved. Awaiting full return of bowel function. Plan: Continue current management. We will give Dulcolax suppository to help with bowel function.  LOS: 1 day    Aviva Signs 03/23/2017

## 2017-03-23 NOTE — Plan of Care (Signed)
Problem: Safety: Goal: Ability to remain free from injury will improve Outcome: Adequate for Discharge Pt High Fall risk d/t generalized weakness as well as medications. Pt states she uses cane at home, reports no falls in the past 6 months. Pt educated on safety and fall precautions. Pt verbalized understanding. Will continue to monitor pt

## 2017-03-23 NOTE — Progress Notes (Signed)
Patient ID: Shawna [name], female   DOB: 1940-10-08, 76 y.o.   MRN: [id number]    PROGRESS NOTE    Shawna [name]  BJS:[id number] DOB: [birth year] DOA: 03/22/2017  PCP: [name], MD   Brief Narrative:  Patient is 76 year old female with multiple chronic medical conditions, including ESRD on HD MWF, CAD, HTN, diastolic CHF, presented with main concern of 2 days duration of progressively worsening lower quadrants abdominal pain associated with nausea and few days of nonbloody diarrhea that has since resolved.  Assessment & Plan:   Lower quadrants abdominal pain - Suspected to be due to enteritis - Some improvement this morning, still intermittent episodes of epigastric and lower quadrant pain - Continuing supportive care with IV antibiotics, ertapenem given with HD - Provide analgesia and antiemetics as needed - Appreciate surgeon Dr. Arnoldo Morale assistance  End-stage renal disease, hyperkalemia - On hemodialysis Monday Wednesday and Friday - Appreciate assistance of nephrologist Dr. Theador Hawthorne - Potassium has been stable and within normal limits  Left upper extremity pain - Mostly hand, concern with skin discoloration, perfusion - Upper extremity Doppler pending  Hypertension, Essential - At home taking Coreg, Imdur scheduled, also taking clonidine and hydralazine as needed - Continue Coreg and Imdur - Allow as needed hydralazine if systolic blood pressure greater than 155  Anemia of chronic disease, ESRD - Goal hemoglobin 9-11 - No evidence of bleeding - No need for epo at this time  CAD, NSTEMI in 2013 - Continue aspirin - Patient also on amiodarone, Brilinta   Renal mass - noted on right and left kidney - once acute problem resolved, can proceed with further evaluation with renal protocol CT   DVT prophylaxis:  Heparin subcutaneous Code Status: Full Family Communication: Patient and husband and bedside Disposition Plan: To be determined  Consultants:    Nephrology  Surgery  Procedures:   None  Antimicrobials:   Ertapenem 03/22/2017 -->  Subjective: Patient reports feeling somewhat better, still with intermittent episodes of abdominal discomfort.  Objective: Vitals:   03/22/17 1800 03/22/17 1805 03/22/17 2029 03/23/17 0614  BP: (!) 182/76 (!) 180/79 (!) 106/44 (!) 123/44  Pulse: 93 98 77 71  Resp:  20 19 20   Temp:  98.4 F (36.9 C) 99.9 F (37.7 C) 97.8 F (36.6 C)  TempSrc:  Oral Oral Oral  SpO2:   100% 100%  Weight:    58.9 kg (129 lb 13.6 oz)  Height:        Intake/Output Summary (Last 24 hours) at 03/23/17 1318 Last data filed at 03/23/17 0333  Gross per 24 hour  Intake                0 ml  Output                0 ml  Net                0 ml   Filed Weights   03/22/17 0447 03/22/17 1415 03/23/17 0614  Weight: 57.2 kg (126 lb) 57.5 kg (126 lb 12.2 oz) 58.9 kg (129 lb 13.6 oz)    Examination:  General exam: Appears calm and comfortable  Respiratory system: Clear to auscultation. Respiratory effort normal. Cardiovascular system: S1 & S2 heard, RRR. No JVD, murmurs, rubs, gallops or clicks. No pedal edema. Gastrointestinal system: Abdomen is nondistended, soft and nontender.  Central nervous system: Alert and oriented. No focal neurological deficits.  Data Reviewed: I have personally reviewed following labs and imaging studies  CBC:  Recent Labs Lab 03/22/17 0602 03/23/17 0519  WBC 8.6 5.3  NEUTROABS 7.2  --   HGB 13.5 10.8*  HCT 42.8 33.9*  MCV 105.2* 105.0*  PLT 260 163   Basic Metabolic Panel:  Recent Labs Lab 03/22/17 0602 03/23/17 0519  NA 138 136  K 5.2* 4.1  CL 99* 97*  CO2 25 30  GLUCOSE 96 71  BUN 36* 22*  CREATININE 8.26* 5.84*  CALCIUM 8.5* 7.9*   Liver Function Tests:  Recent Labs Lab 03/22/17 0602  AST 19  ALT 14  ALKPHOS 104  BILITOT 0.5  PROT 7.4  ALBUMIN 4.0    Recent Labs Lab 03/22/17 0602  LIPASE 31   Urine analysis:    Component Value Date/Time    COLORURINE YELLOW 12/16/2012 1919   APPEARANCEUR CLOUDY (A) 12/16/2012 1919   LABSPEC 1.009 12/16/2012 1919   PHURINE 7.5 12/16/2012 1919   GLUCOSEU NEGATIVE 12/16/2012 1919   HGBUR TRACE (A) 12/16/2012 1919   BILIRUBINUR NEGATIVE 12/16/2012 1919   KETONESUR NEGATIVE 12/16/2012 1919   PROTEINUR 100 (A) 12/16/2012 1919   UROBILINOGEN 0.2 12/16/2012 1919   NITRITE NEGATIVE 12/16/2012 1919   LEUKOCYTESUR SMALL (A) 12/16/2012 1919   Recent Results (from the past 240 hour(s))  MRSA PCR Screening     Status: None   Collection Time: 03/22/17  8:51 AM  Result Value Ref Range Status   MRSA by PCR NEGATIVE NEGATIVE Final    Comment:        The GeneXpert MRSA Assay (FDA approved for NASAL specimens only), is one component of a comprehensive MRSA colonization surveillance program. It is not intended to diagnose MRSA infection nor to guide or monitor treatment for MRSA infections.       Radiology Studies: Ct Abdomen Pelvis Wo Contrast  Result Date: 03/22/2017 CLINICAL DATA:  76 y/o F; generalized abdominal pain and abdominal distention. EXAM: CT ABDOMEN AND PELVIS WITHOUT CONTRAST TECHNIQUE: Multidetector CT imaging of the abdomen and pelvis was performed following the standard protocol without IV contrast. COMPARISON:  11/18/2016 CT of abdomen and pelvis. FINDINGS: Lower chest: Mild cardiomegaly. Severe coronary artery calcification. Left lower lobe platelike atelectasis. Hepatobiliary: No focal liver abnormality. Cholelithiasis. No intra or extrahepatic biliary ductal dilatation. Pancreas: Unremarkable. No pancreatic ductal dilatation or surrounding inflammatory changes. Spleen: Normal in size without focal abnormality. Adrenals/Urinary Tract: Normal adrenal glands. Right upper kidney mass measuring 2.9 cm and left kidney interpolar mass measuring 1.6 cm are stable. Severe atrophy of kidneys. No hydronephrosis. Bladder is collapsed. Stomach/Bowel: Patent rectosigmoid and cecal region  anastomosis. Pancolonic diverticulosis. The short segment of small bowel inflammatory changes with edema extending into the mesentery centered around a small focus of air extending beyond the wall of the small bowel (series 5, image 29) which may represent a contained perforation or inflamed small bowel diverticulum. No bowel obstruction. Vascular/Lymphatic: Aortic atherosclerosis with severe calcific. No enlarged abdominal or pelvic lymph nodes. Reproductive: Status post hysterectomy. No adnexal masses. Other: Small volume of ascites within the left hemiabdomen. Musculoskeletal: No fracture is seen. IMPRESSION: 1. Short segment of inflamed small bowel surrounding a small focus of air extending beyond the bowel wall which may represent a contained perforation or small bowel diverticulitis. Trace ascites is likely reactive. 2. Mild cardiomegaly and severe coronary artery calcification. 3. Cholelithiasis. 4. Stable right kidney upper pole 2.9 cm indeterminate mass and left kidney interpolar 1.6 cm indeterminate mass. Renal protocol CT with and without contrast is recommended to further characterize. This recommendation follows ACR  consensus guidelines: Management of the Incidental Renal Mass on CT: A White Paper of the ACR Incidental Findings Committee. J Am Coll Radiol [phone]. 5. Pan colonic diverticulosis. Electronically Signed   By: Kristine Garbe M.D.   On: 03/22/2017 06:47   Dg Abdomen Acute W/chest  Result Date: 03/22/2017 CLINICAL DATA:  Diffuse abdominal pain. EXAM: DG ABDOMEN ACUTE W/ 1V CHEST COMPARISON:  Radiograph 02/08/2017.  CT 11/18/2016 FINDINGS: Right-sided dialysis catheter with tip in the distal SVC. Cardiomegaly. Post median sternotomy. Bibasilar atelectasis, right greater than left. No pulmonary edema or pleural fluid. No free intra-abdominal air. No bowel dilatation to suggest obstruction. Scattered retained barium within colonic diverticula. Multiple enteric sutures and  surgical clips in the pelvis. Small volume of colonic stool. IMPRESSION: 1. Normal bowel gas pattern. No free air. Retained barium within colonic diverticulum. 2. Mild bibasilar atelectasis.  Mild cardiomegaly. Electronically Signed   By: Jeb Levering M.D.   On: 03/22/2017 06:00      Scheduled Meds: . amiodarone  200 mg Oral Daily  . aspirin EC  81 mg Oral Daily  . bisacodyl  10 mg Rectal q12n4p  . carvedilol  6.25 mg Oral BID WC  . cinacalcet  30 mg Oral QPC supper  . famotidine  20 mg Oral BID  . heparin  5,000 Units Subcutaneous Q8H  . isosorbide mononitrate  120 mg Oral Daily  . multivitamin  1 tablet Oral Daily  . sevelamer carbonate  800 mg Oral TID WC  . ticagrelor  90 mg Oral BID   Continuous Infusions: . sodium chloride    . sodium chloride    . ertapenem    . [START ON 03/24/2017] ertapenem       LOS: 1 day    Time spent: 25 minutes    Faye Ramsay, MD [facility] Pager [phone]  If 7PM-7AM, please contact night-coverage [email or web address] Password [email or web address] 03/23/2017, 1:18 PM

## 2017-03-23 NOTE — Progress Notes (Signed)
Subjective: Interval History: has no complaint of nausea or vomiting. Still she has some abdominal pain but is much better. Presently she denies any difficulty breathing..  Objective: Vital signs in last 24 hours: Temp:  [97.8 F (36.6 C)-99.9 F (37.7 C)] 97.8 F (36.6 C) (09/13 0614) Pulse Rate:  [65-98] 71 (09/13 0614) Resp:  [18-20] 20 (09/13 0614) BP: (92-223)/(44-98) 123/44 (09/13 0614) SpO2:  [100 %] 100 % (09/13 0614) Weight:  [57.5 kg (126 lb 12.2 oz)-58.9 kg (129 lb 13.6 oz)] 58.9 kg (129 lb 13.6 oz) (09/13 0614) Weight change: 0.347 kg (12.2 oz)  Intake/Output from previous day: No intake/output data recorded. Intake/Output this shift: No intake/output data recorded.  General appearance: alert, cooperative and no distress Resp: clear to auscultation bilaterally Cardio: irregularly irregular rhythm GI: Soft, slight tenderness and positive bowel sound Extremities: No edema  Lab Results:  Recent Labs  03/22/17 0602 03/23/17 0519  WBC 8.6 5.3  HGB 13.5 10.8*  HCT 42.8 33.9*  PLT 260 201   BMET:  Recent Labs  03/22/17 0602 03/23/17 0519  NA 138 136  K 5.2* 4.1  CL 99* 97*  CO2 25 30  GLUCOSE 96 71  BUN 36* 22*  CREATININE 8.26* 5.84*  CALCIUM 8.5* 7.9*   No results for input(s): PTH in the last 72 hours. Iron Studies: No results for input(s): IRON, TIBC, TRANSFERRIN, FERRITIN in the last 72 hours.  Studies/Results: Ct Abdomen Pelvis Wo Contrast  Result Date: 03/22/2017 CLINICAL DATA:  76 y/o F; generalized abdominal pain and abdominal distention. EXAM: CT ABDOMEN AND PELVIS WITHOUT CONTRAST TECHNIQUE: Multidetector CT imaging of the abdomen and pelvis was performed following the standard protocol without IV contrast. COMPARISON:  11/18/2016 CT of abdomen and pelvis. FINDINGS: Lower chest: Mild cardiomegaly. Severe coronary artery calcification. Left lower lobe platelike atelectasis. Hepatobiliary: No focal liver abnormality. Cholelithiasis. No intra or  extrahepatic biliary ductal dilatation. Pancreas: Unremarkable. No pancreatic ductal dilatation or surrounding inflammatory changes. Spleen: Normal in size without focal abnormality. Adrenals/Urinary Tract: Normal adrenal glands. Right upper kidney mass measuring 2.9 cm and left kidney interpolar mass measuring 1.6 cm are stable. Severe atrophy of kidneys. No hydronephrosis. Bladder is collapsed. Stomach/Bowel: Patent rectosigmoid and cecal region anastomosis. Pancolonic diverticulosis. The short segment of small bowel inflammatory changes with edema extending into the mesentery centered around a small focus of air extending beyond the wall of the small bowel (series 5, image 29) which may represent a contained perforation or inflamed small bowel diverticulum. No bowel obstruction. Vascular/Lymphatic: Aortic atherosclerosis with severe calcific. No enlarged abdominal or pelvic lymph nodes. Reproductive: Status post hysterectomy. No adnexal masses. Other: Small volume of ascites within the left hemiabdomen. Musculoskeletal: No fracture is seen. IMPRESSION: 1. Short segment of inflamed small bowel surrounding a small focus of air extending beyond the bowel wall which may represent a contained perforation or small bowel diverticulitis. Trace ascites is likely reactive. 2. Mild cardiomegaly and severe coronary artery calcification. 3. Cholelithiasis. 4. Stable right kidney upper pole 2.9 cm indeterminate mass and left kidney interpolar 1.6 cm indeterminate mass. Renal protocol CT with and without contrast is recommended to further characterize. This recommendation follows ACR consensus guidelines: Management of the Incidental Renal Mass on CT: A White Paper of the ACR Incidental Findings Committee. J Am Coll Radiol [phone]. 5. Pan colonic diverticulosis. Electronically Signed   By: Kristine Garbe M.D.   On: 03/22/2017 06:47   Dg Abdomen Acute W/chest  Result Date: 03/22/2017 CLINICAL DATA:  Diffuse  abdominal pain. EXAM: DG ABDOMEN ACUTE W/ 1V CHEST COMPARISON:  Radiograph 02/08/2017.  CT 11/18/2016 FINDINGS: Right-sided dialysis catheter with tip in the distal SVC. Cardiomegaly. Post median sternotomy. Bibasilar atelectasis, right greater than left. No pulmonary edema or pleural fluid. No free intra-abdominal air. No bowel dilatation to suggest obstruction. Scattered retained barium within colonic diverticula. Multiple enteric sutures and surgical clips in the pelvis. Small volume of colonic stool. IMPRESSION: 1. Normal bowel gas pattern. No free air. Retained barium within colonic diverticulum. 2. Mild bibasilar atelectasis.  Mild cardiomegaly. Electronically Signed   By: Jeb Levering M.D.   On: 03/22/2017 06:00    I have reviewed the patient's current medications.  Assessment/Plan: Problem #1 abdominal pain, nausea. Presently patient is feeling better. At this moment etiology was thought to be possibly secondary to ileitis/diverticulitis. Patient presently afebrile and her white blood cell count is normal. She had episode of diarrhea last week. Presently she doesn't have any diarrhea. Problem #2 end-stage renal disease: She is status post hemodialysis yesterday. Her potassium is normal. Problem #3 anemia: Her hemoglobin is was no target goal Problem #4 coronary artery disease: Presently patient is asymptomatic and chest pain Problem #5 fluid management: No sign of fluid overload Problem #6 bone and mineral disorder: Her calcium is range Problem #7 history of renal mass Plan: We'll make arrangements for patient to get dialysis tomorrow We'll check her renal panel and CBC in the morning.    LOS: 1 day   Sakiyah Shur S 03/23/2017,9:06 AM

## 2017-03-24 ENCOUNTER — Inpatient Hospital Stay (HOSPITAL_COMMUNITY): Payer: Medicare HMO

## 2017-03-24 ENCOUNTER — Encounter (HOSPITAL_COMMUNITY): Payer: Self-pay

## 2017-03-24 DIAGNOSIS — K559 Vascular disorder of intestine, unspecified: Secondary | ICD-10-CM

## 2017-03-24 DIAGNOSIS — K5792 Diverticulitis of intestine, part unspecified, without perforation or abscess without bleeding: Secondary | ICD-10-CM

## 2017-03-24 DIAGNOSIS — K529 Noninfective gastroenteritis and colitis, unspecified: Secondary | ICD-10-CM

## 2017-03-24 LAB — RENAL FUNCTION PANEL
Albumin: 3.1 g/dL — ABNORMAL LOW (ref 3.5–5.0)
Anion gap: 12 (ref 5–15)
BUN: 36 mg/dL — AB (ref 6–20)
CHLORIDE: 95 mmol/L — AB (ref 101–111)
CO2: 26 mmol/L (ref 22–32)
Calcium: 7.9 mg/dL — ABNORMAL LOW (ref 8.9–10.3)
Creatinine, Ser: 8.15 mg/dL — ABNORMAL HIGH (ref 0.44–1.00)
GFR calc Af Amer: 5 mL/min — ABNORMAL LOW (ref >60)
GFR, EST NON AFRICAN AMERICAN: 4 mL/min — AB (ref >60)
GLUCOSE: 73 mg/dL (ref 65–99)
POTASSIUM: 4.6 mmol/L (ref 3.5–5.1)
Phosphorus: 5.2 mg/dL — ABNORMAL HIGH (ref 2.5–4.6)
Sodium: 133 mmol/L — ABNORMAL LOW (ref 135–145)

## 2017-03-24 LAB — CBC
HEMATOCRIT: 36.8 % (ref 36.0–46.0)
Hemoglobin: 11.9 g/dL — ABNORMAL LOW (ref 12.0–15.0)
MCH: 33.3 pg (ref 26.0–34.0)
MCHC: 32.3 g/dL (ref 30.0–36.0)
MCV: 103.1 fL — AB (ref 78.0–100.0)
Platelets: 212 10*3/uL (ref 150–400)
RBC: 3.57 MIL/uL — ABNORMAL LOW (ref 3.87–5.11)
RDW: 17.8 % — AB (ref 11.5–15.5)
WBC: 6.1 10*3/uL (ref 4.0–10.5)

## 2017-03-24 MED ORDER — HYDROCORTISONE NA SUCCINATE PF 250 MG IJ SOLR
200.0000 mg | Freq: Once | INTRAMUSCULAR | Status: AC
  Administered 2017-03-24: 200 mg via INTRAVENOUS
  Filled 2017-03-24: qty 200

## 2017-03-24 MED ORDER — DIPHENHYDRAMINE HCL 25 MG PO CAPS
50.0000 mg | ORAL_CAPSULE | Freq: Once | ORAL | Status: AC
  Administered 2017-03-24: 50 mg via ORAL
  Filled 2017-03-24: qty 2

## 2017-03-24 MED ORDER — IOPAMIDOL (ISOVUE-370) INJECTION 76%
100.0000 mL | Freq: Once | INTRAVENOUS | Status: AC | PRN
  Administered 2017-03-24: 17:00:00 via INTRAVENOUS

## 2017-03-24 MED ORDER — DIPHENHYDRAMINE HCL 25 MG PO CAPS
50.0000 mg | ORAL_CAPSULE | Freq: Once | ORAL | Status: AC
  Administered 2017-03-25: 50 mg via ORAL
  Filled 2017-03-24: qty 2

## 2017-03-24 MED ORDER — SODIUM CHLORIDE 0.9 % IV SOLN: 100.0000 mL | INTRAVENOUS | Status: DC | PRN

## 2017-03-24 MED ORDER — FAMOTIDINE 20 MG PO TABS
20.0000 mg | ORAL_TABLET | ORAL | Status: DC
  Administered 2017-03-28 – 2017-04-05 (×5): 20 mg via ORAL
  Filled 2017-03-24 (×6): qty 1

## 2017-03-24 MED ORDER — HYDROCORTISONE NA SUCCINATE PF 250 MG IJ SOLR
200.0000 mg | Freq: Once | INTRAMUSCULAR | Status: DC
  Filled 2017-03-24 (×2): qty 200

## 2017-03-24 NOTE — Progress Notes (Signed)
Radiology called about IV access.  Radiology stated CT scan could not be performed due to size of IV access.  Dr. Laural Golden notified and stated to hold CT scan tonight and would be followed by GI in am.  Hospitalist Dr. Aggie Moats notified of situation with CT scan.  No new orders received at this time.

## 2017-03-24 NOTE — Progress Notes (Signed)
Subjective: Interval History: Patient complains of abdominal pain when she is moving. She denies any nausea or vomiting. She was able to move her bowels after laxative was given. Patient denies any difficulty in breathing. And she was able to tolerate liquid diet.   Objective: Vital signs in last 24 hours: Temp:  [98.5 F (36.9 C)-99 F (37.2 C)] 98.5 F (36.9 C) (09/14 0610) Pulse Rate:  [71-76] 71 (09/14 0610) Resp:  [20] 20 (09/14 0610) BP: (132-148)/(39-58) 132/58 (09/14 0610) SpO2:  [97 %-100 %] 97 % (09/14 0610) Weight:  [57.1 kg (125 lb 14.1 oz)] 57.1 kg (125 lb 14.1 oz) (09/14 0610) Weight change: -0.4 kg (-14.1 oz)  Intake/Output from previous day: 09/13 0701 - 09/14 0700 In: 360 [P.O.:360] Out: -  Intake/Output this shift: No intake/output data recorded.  General appearance: alert, cooperative and no distress Resp: clear to auscultation bilaterally Cardio: irregularly irregular rhythm GI: Soft, slight tenderness and positive bowel sound Extremities: No edema  Lab Results:  Recent Labs  03/23/17 0519 03/24/17 0518  WBC 5.3 6.1  HGB 10.8* 11.9*  HCT 33.9* 36.8  PLT 201 212   BMET:   Recent Labs  03/23/17 0519 03/24/17 0518  NA 136 133*  K 4.1 4.6  CL 97* 95*  CO2 30 26  GLUCOSE 71 73  BUN 22* 36*  CREATININE 5.84* 8.15*  CALCIUM 7.9* 7.9*   No results for input(s): PTH in the last 72 hours. Iron Studies: No results for input(s): IRON, TIBC, TRANSFERRIN, FERRITIN in the last 72 hours.  Studies/Results: US Venous Img Upper Uni Left  Result Date: 03/23/2017 CLINICAL DATA:  Left hand pain since yesterday. Left hand turning cold for 2 weeks. Patient has a Gore-Tex graft in the lower arm and antecubital fossa region. EXAM: LEFT UPPER EXTREMITY VENOUS DOPPLER ULTRASOUND TECHNIQUE: Gray-scale sonography with graded compression, as well as color Doppler and duplex ultrasound were performed to evaluate the upper extremity deep venous system from the level of  the subclavian vein and including the jugular, axillary, basilic, radial, ulnar and upper cephalic vein. Spectral Doppler was utilized to evaluate flow at rest and with distal augmentation maneuvers. COMPARISON:  None. FINDINGS: Contralateral Subclavian Vein: Respiratory phasicity is normal and symmetric with the symptomatic side. No evidence of thrombus. Normal compressibility. Internal Jugular Vein: No evidence of thrombus. Normal compressibility, respiratory phasicity and response to augmentation. Subclavian Vein: No evidence of thrombus. Normal compressibility, respiratory phasicity and response to augmentation. Axillary Vein: No evidence of thrombus. Normal compressibility, respiratory phasicity and response to augmentation. Cephalic Vein: Irregular hypoechoic to mildly hyperechoic area in the left forearm near the patient's dialysis graft that may reflect a chronically thrombosed cephalic vein. Basilic Vein: No evidence of thrombus. Normal compressibility, respiratory phasicity and response to augmentation. Brachial Veins: No evidence of thrombus. Normal compressibility, respiratory phasicity and response to augmentation. Radial Veins: No evidence of thrombus. Normal compressibility, respiratory phasicity and response to augmentation. Ulnar Veins: No evidence of thrombus. Normal compressibility, respiratory phasicity and response to augmentation. Venous Reflux:  None visualized. Other Findings:  The dialysis fistula is widely patent. IMPRESSION: 1. Probable chronic thrombosis in the cephalic vein in the patient's dialysis fistula in the left arm. 2. Remaining veins are widely patent. Dialysis fistula is widely patent. Electronically Signed   By: Lajean Manes M.D.   On: 03/23/2017 15:55    I have reviewed the patient's current medications.  Assessment/Plan: Problem #1 abdominal pain, nausea. Presently patient is feeling better. At this moment etiology  was thought to be possibly secondary to  ileitis/diverticulitis. she has some pain when she is moving.  Problem #2 end-stage renal disease: She is status post hemodialysis on Wednesday. Patient is due for dialysis today Problem #3 anemia: Her hemoglobin is with in  target goal Problem #4 coronary artery disease: Presently patient is asymptomatic and chest pain Problem #5 fluid management: No sign of fluid overload Problem #6 bone and mineral disorder: Her calcium  and phosphorus is range Problem #7 history of renal mass Problem #8 hypertension: Her blood pressure is reasonably controlled Plan: We'll make arrangements for patient to get dialysis today. 2] we'll remove 2 L his systolic blood pressure remains above 90.  We'll check her renal panel and CBC in the morning.    LOS: 2 days   Titianna Loomis S 03/24/2017,8:25 AM

## 2017-03-24 NOTE — Progress Notes (Addendum)
Vascular access team to see patient at 1931.  Team is here to procure IV access to patient.  IV team educated patient about the benefits of an IJ.  Patient refused IJ access.  Patient has poor veins that would not tolerate a PICC, and is a hemodialysis patient.  At 2100, IV team started a 22 gauge iv in right arm.

## 2017-03-24 NOTE — Procedures (Signed)
     HEMODIALYSIS TREATMENT NOTE:  3.5 hour heparin-free dialysis completed via right IJ tunneled cath. Exit site unremarkable. Goal met: 2.1 liters removed; no interruption in ultrafiltration.  All blood was returned.  Pt for abdominal CT with contrast after HD.  Plan for short HD session tomorrow.  Report given to Janan Ridge, RN.  Rockwell Alexandria, RN, CDN

## 2017-03-24 NOTE — Progress Notes (Signed)
Subjective: Patient currently has no abdominal pain. She is describing pain earlier today when she was lying in bed and it woke her up.  Objective: Vital signs in last 24 hours: Temp:  [98.5 F (36.9 C)-99 F (37.2 C)] 98.5 F (36.9 C) (09/14 0610) Pulse Rate:  [71-76] 71 (09/14 0610) Resp:  [20] 20 (09/14 0610) BP: (132-148)/(39-58) 132/58 (09/14 0610) SpO2:  [97 %-100 %] 97 % (09/14 0610) Weight:  [125 lb 14.1 oz (57.1 kg)] 125 lb 14.1 oz (57.1 kg) (09/14 0610) Last BM Date: 03/23/17  Intake/Output from previous day: 09/13 0701 - 09/14 0700 In: 360 [P.O.:360] Out: -  Intake/Output this shift: Total I/O In: 240 [P.O.:240] Out: -   General appearance: alert, cooperative and no distress GI: soft, non-tender; bowel sounds normal; no masses,  no organomegaly  Lab Results:   Recent Labs  03/23/17 0519 03/24/17 0518  WBC 5.3 6.1  HGB 10.8* 11.9*  HCT 33.9* 36.8  PLT 201 212   BMET  Recent Labs  03/23/17 0519 03/24/17 0518  NA 136 133*  K 4.1 4.6  CL 97* 95*  CO2 30 26  GLUCOSE 71 73  BUN 22* 36*  CREATININE 5.84* 8.15*  CALCIUM 7.9* 7.9*   PT/INR No results for input(s): LABPROT, INR in the last 72 hours.  Studies/Results: US Venous Img Upper Uni Left  Result Date: 03/23/2017 CLINICAL DATA:  Left hand pain since yesterday. Left hand turning cold for 2 weeks. Patient has a Gore-Tex graft in the lower arm and antecubital fossa region. EXAM: LEFT UPPER EXTREMITY VENOUS DOPPLER ULTRASOUND TECHNIQUE: Gray-scale sonography with graded compression, as well as color Doppler and duplex ultrasound were performed to evaluate the upper extremity deep venous system from the level of the subclavian vein and including the jugular, axillary, basilic, radial, ulnar and upper cephalic vein. Spectral Doppler was utilized to evaluate flow at rest and with distal augmentation maneuvers. COMPARISON:  None. FINDINGS: Contralateral Subclavian Vein: Respiratory phasicity is normal  and symmetric with the symptomatic side. No evidence of thrombus. Normal compressibility. Internal Jugular Vein: No evidence of thrombus. Normal compressibility, respiratory phasicity and response to augmentation. Subclavian Vein: No evidence of thrombus. Normal compressibility, respiratory phasicity and response to augmentation. Axillary Vein: No evidence of thrombus. Normal compressibility, respiratory phasicity and response to augmentation. Cephalic Vein: Irregular hypoechoic to mildly hyperechoic area in the left forearm near the patient's dialysis graft that may reflect a chronically thrombosed cephalic vein. Basilic Vein: No evidence of thrombus. Normal compressibility, respiratory phasicity and response to augmentation. Brachial Veins: No evidence of thrombus. Normal compressibility, respiratory phasicity and response to augmentation. Radial Veins: No evidence of thrombus. Normal compressibility, respiratory phasicity and response to augmentation. Ulnar Veins: No evidence of thrombus. Normal compressibility, respiratory phasicity and response to augmentation. Venous Reflux:  None visualized. Other Findings:  The dialysis fistula is widely patent. IMPRESSION: 1. Probable chronic thrombosis in the cephalic vein in the patient's dialysis fistula in the left arm. 2. Remaining veins are widely patent. Dialysis fistula is widely patent. Electronically Signed   By: Lajean Manes M.D.   On: 03/23/2017 15:55    Anti-infectives: Anti-infectives    Start     Dose/Rate Route Frequency Ordered Stop   03/24/17 1200  ertapenem (INVANZ) 1 g in sodium chloride 0.9 % 50 mL IVPB     1 g 100 mL/hr over 30 Minutes Intravenous Every M-W-F (Hemodialysis) 03/23/17 0830     03/22/17 0745  ertapenem (INVANZ) 1 g in sodium chloride  0.9 % 50 mL IVPB     1 g 100 mL/hr over 30 Minutes Intravenous  Once 03/22/17 0735     03/22/17 0715  clindamycin (CLEOCIN) IVPB 600 mg  Status:  Discontinued     600 mg 100 mL/hr over 30  Minutes Intravenous  Once 03/22/17 0700 03/22/17 0748   03/22/17 0700  metroNIDAZOLE (FLAGYL) IVPB 500 mg  Status:  Discontinued     500 mg 100 mL/hr over 60 Minutes Intravenous  Once 03/22/17 0657 03/22/17 0658   03/22/17 0700  clindamycin (CLEOCIN) IVPB 600 mg  Status:  Discontinued     600 mg 100 mL/hr over 30 Minutes Intravenous  Once 03/22/17 0658 03/22/17 0700      Assessment/Plan: Impression: Abdominal pain which is sporadic in nature, enteritis with question contained diverticular perforation versus air in the diverticulum. In talking with the patient, itappears that her pain is postprandial in nature. She states that she has been followed by Dr. Laural Golden of GI and saw him last week. She did not see him for this particular pain. Reviewing the CT scan of the abdomen, she has severe atherosclerotic disease of the celiac trunk and SMA. Will get CT angiogram to further assess for transient mesenteric ischemia. This was discussed with Dr. Thornton Papas of radiology who also discussed this with interventional radiology.  LOS: 2 days    Aviva Signs 03/24/2017

## 2017-03-24 NOTE — Progress Notes (Signed)
Patient ID: Shawna [name], female   DOB: Nov 27, 1940, 76 y.o.   MRN: [id number]    PROGRESS NOTE  Shawna [name]  PYK:[id number] DOB: 06-03-1941 DOA: 03/22/2017  PCP: [name], MD   Brief Narrative:  Patient is 76 year old female with multiple chronic medical conditions, including ESRD on HD MWF, CAD, HTN, diastolic CHF, presented with main concern of 2 days duration of progressively worsening lower quadrants abdominal pain associated with nausea and few days of nonbloody diarrhea that has since resolved.  Assessment & Plan:   Lower quadrants abdominal pain - Initial thought was enteritis as underlying etiology however, patient continues to experience intermittent episodes of epigastric pain despite use of antibiotics - Now with concern of possible underlying mesenteric ischemia - Appreciate assistance of the GI and surgery teams - Plan for abdominal CTA for further evaluation - Continue to provide analgesia as needed - Pending abdominal scan results, may need transfer to [facility]  End-stage renal disease, hyperkalemia - On hemodialysis Monday Wednesday and Friday - Appreciate assistance of nephrologist   Left upper extremity pain - Mostly hand, concern with skin discoloration, perfusion - Upper extremity Doppler notable for probable chronic thrombosis in the cephalic vein in the patient's dialysis fistula in the left arm, remaining veins are patent, dialysis fistula is widely patent  Hypertension, Essential - At home taking Coreg, Imdur scheduled, also taking clonidine and hydralazine as needed - Continue Coreg and Imdur - Continue hydralazine as needed for systolic blood pressure greater than 155  Anemia of chronic disease, ESRD - Goal hemoglobin 9-11 - No evidence of active bleeding  CAD, NSTEMI in 2013 - Continue aspirin - Patient also on amiodarone, Brilinta   Renal mass - noted on right and left kidney - once acute problem resolved, can proceed with further evaluation  with renal protocol CT   DVT prophylaxis:  Heparin subcutaneous Code Status: Full Family Communication: Patient and husband and bedside Disposition Plan: To be determined  Consultants:   Nephrology  Surgery  Gastroenterology  Procedures:   None  Antimicrobials:   Ertapenem 03/22/2017 -->  Subjective: Patient reports ongoing discomfort in epigastric area.  Objective: Vitals:   03/24/17 1130 03/24/17 1200 03/24/17 1230 03/24/17 1300  BP: (!) 152/62 (!) 144/70 (!) 159/71 (!) 167/81  Pulse: 68 70 81 70  Resp:      Temp:      TempSrc:      SpO2:      Weight:      Height:        Intake/Output Summary (Last 24 hours) at 03/24/17 1320 Last data filed at 03/24/17 0900  Gross per 24 hour  Intake              600 ml  Output                0 ml  Net              600 ml   Filed Weights   03/23/17 0614 03/24/17 0610 03/24/17 1050  Weight: 58.9 kg (129 lb 13.6 oz) 57.1 kg (125 lb 14.1 oz) 57.1 kg (125 lb 14.1 oz)   Physical Exam  Constitutional: Appears calm and pleasant, NAD CVS: RRR, S1/S2 +, no murmurs, no gallops, no carotid bruit.  Pulmonary: Effort and breath sounds normal, no stridor, rhonchi, wheezes, rales.  Abdominal: Soft. BS +,  tender in epigastric area  Musculoskeletal: Normal range of motion. No edema and no tenderness.   Data Reviewed: I have personally  reviewed following labs and imaging studies  CBC:  Recent Labs Lab 03/22/17 0602 03/23/17 0519 03/24/17 0518  WBC 8.6 5.3 6.1  NEUTROABS 7.2  --   --   HGB 13.5 10.8* 11.9*  HCT 42.8 33.9* 36.8  MCV 105.2* 105.0* 103.1*  PLT 260 201 478   Basic Metabolic Panel:  Recent Labs Lab 03/22/17 0602 03/23/17 0519 03/24/17 0518  NA 138 136 133*  K 5.2* 4.1 4.6  CL 99* 97* 95*  CO2 25 30 26   GLUCOSE 96 71 73  BUN 36* 22* 36*  CREATININE 8.26* 5.84* 8.15*  CALCIUM 8.5* 7.9* 7.9*  PHOS  --   --  5.2*   Liver Function Tests:  Recent Labs Lab 03/22/17 0602 03/24/17 0518  AST 19  --     ALT 14  --   ALKPHOS 104  --   BILITOT 0.5  --   PROT 7.4  --   ALBUMIN 4.0 3.1*    Recent Labs Lab 03/22/17 0602  LIPASE 31   Urine analysis:    Component Value Date/Time   COLORURINE YELLOW 12/16/2012 1919   APPEARANCEUR CLOUDY (A) 12/16/2012 1919   LABSPEC 1.009 12/16/2012 1919   PHURINE 7.5 12/16/2012 1919   GLUCOSEU NEGATIVE 12/16/2012 1919   HGBUR TRACE (A) 12/16/2012 1919   BILIRUBINUR NEGATIVE 12/16/2012 1919   KETONESUR NEGATIVE 12/16/2012 1919   PROTEINUR 100 (A) 12/16/2012 1919   UROBILINOGEN 0.2 12/16/2012 1919   NITRITE NEGATIVE 12/16/2012 1919   LEUKOCYTESUR SMALL (A) 12/16/2012 1919   Recent Results (from the past 240 hour(s))  MRSA PCR Screening     Status: None   Collection Time: 03/22/17  8:51 AM  Result Value Ref Range Status   MRSA by PCR NEGATIVE NEGATIVE Final    Comment:        The GeneXpert MRSA Assay (FDA approved for NASAL specimens only), is one component of a comprehensive MRSA colonization surveillance program. It is not intended to diagnose MRSA infection nor to guide or monitor treatment for MRSA infections.     Radiology Studies: US Venous Img Upper Uni Left  Result Date: 03/23/2017 CLINICAL DATA:  Left hand pain since yesterday. Left hand turning cold for 2 weeks. Patient has a Gore-Tex graft in the lower arm and antecubital fossa region. EXAM: LEFT UPPER EXTREMITY VENOUS DOPPLER ULTRASOUND TECHNIQUE: Gray-scale sonography with graded compression, as well as color Doppler and duplex ultrasound were performed to evaluate the upper extremity deep venous system from the level of the subclavian vein and including the jugular, axillary, basilic, radial, ulnar and upper cephalic vein. Spectral Doppler was utilized to evaluate flow at rest and with distal augmentation maneuvers. COMPARISON:  None. FINDINGS: Contralateral Subclavian Vein: Respiratory phasicity is normal and symmetric with the symptomatic side. No evidence of thrombus. Normal  compressibility. Internal Jugular Vein: No evidence of thrombus. Normal compressibility, respiratory phasicity and response to augmentation. Subclavian Vein: No evidence of thrombus. Normal compressibility, respiratory phasicity and response to augmentation. Axillary Vein: No evidence of thrombus. Normal compressibility, respiratory phasicity and response to augmentation. Cephalic Vein: Irregular hypoechoic to mildly hyperechoic area in the left forearm near the patient's dialysis graft that may reflect a chronically thrombosed cephalic vein. Basilic Vein: No evidence of thrombus. Normal compressibility, respiratory phasicity and response to augmentation. Brachial Veins: No evidence of thrombus. Normal compressibility, respiratory phasicity and response to augmentation. Radial Veins: No evidence of thrombus. Normal compressibility, respiratory phasicity and response to augmentation. Ulnar Veins: No evidence of thrombus. Normal  compressibility, respiratory phasicity and response to augmentation. Venous Reflux:  None visualized. Other Findings:  The dialysis fistula is widely patent. IMPRESSION: 1. Probable chronic thrombosis in the cephalic vein in the patient's dialysis fistula in the left arm. 2. Remaining veins are widely patent. Dialysis fistula is widely patent. Electronically Signed   By: Lajean Manes M.D.   On: 03/23/2017 15:55    Scheduled Meds: . amiodarone  200 mg Oral Daily  . aspirin EC  81 mg Oral Daily  . bisacodyl  10 mg Rectal q12n4p  . carvedilol  6.25 mg Oral BID WC  . cinacalcet  30 mg Oral QPC supper  . diphenhydrAMINE  50 mg Oral Once  . famotidine  20 mg Oral BID  . heparin  5,000 Units Subcutaneous Q8H  . isosorbide mononitrate  120 mg Oral Daily  . multivitamin  1 tablet Oral Daily  . sevelamer carbonate  800 mg Oral TID WC  . ticagrelor  90 mg Oral BID   Continuous Infusions: . sodium chloride    . sodium chloride    . sodium chloride    . sodium chloride    . ertapenem     . ertapenem       LOS: 2 days    Time spent: 25 minutes    Faye Ramsay, MD [facility] Pager [phone]  If 7PM-7AM, please contact night-coverage [email or web address] Password [email or web address] 03/24/2017, 1:20 PM

## 2017-03-24 NOTE — Consult Note (Signed)
Referring Provider: Mart Piggs, MD Primary Care Physician:  [name], MD Primary Gastroenterologist:  Dr. Laural Golden  Reason for Consultation:    Abdominal pain.   HPI:   Patient is 76 old African-American female with multiple medical problems who is well known to me from prior evaluations for GI bleed.She was seen in the office on 02/21/2017 and was doing well. She has history of small bowel angiodysplasia 03/21/2017 when she developed mid abdominal pain. She describes this pain to be severe cramping. She did not experience diaphoreses nausea vomiting melena or rectal bleeding fever or chills.he had normal bowel movement earlier in the day. Pain did not let up and she decided to come to emergency room around 5 AM on  03/22/2017. CT revealed focal enteritis with concern for walled off perforation. Patient was begun on IV  antibiotics. She was seen by Dr. Arnoldo Morale who recommended CT angiogram abdomen. Patient continues to have postprandial pain but is getting relief with pain medication. She states most of the pain is in mid abdomen. Her bowels are irregular. She has both constipation and diarrhea.Diarrhea generally occurs after she takes a laxative. She states she may have lost a few pounds recently. She does not recall ever having had this pain before. She denies frequent heartburn.  Last EGD was in 07/29/2016 revealing small sliding hiatal hernia and single distal duodenal or proximal jejunal AV malformation It was ablated with APC. Patient has never smoked cigarettes.   Past Medical History:  Diagnosis Date  . Acute on chronic respiratory failure with hypoxia ([place]) 10/10/2016  . Anxiety   . Arthritis   . AVM (arteriovenous malformation) of colon   . CAD (coronary artery disease)    a. 12/2011 NSTEMI/Cath/PCI LCX (2.25x14 Resolute DES) & D1 (2.25x22 Resolute DES);  b. 01/2012 Cath/PCI: LM 30, LAD 30p, 40-20m, D1 stent ok, 99 in sm branch of diag, LCX patent stent, OM1 20, RCA 95 ost  (4.0x12 Promus DES), EF 55%;  c. 04/2012 Lexi Cardiolite  EF 48%, small area of scar @ base/mid inflat wall with mild peri-infarct ischemia.; CABG 12/4  . Carotid artery disease ([place])    a. 81-44% LICA, 02/1855   . Chronic bronchitis ([place])   . Chronic diastolic CHF (congestive heart failure) ([place])    a. 02/2012 Echo EF 60-65%, nl wall motion, Gr 1 DD, mod MR  . Colon cancer ([place]) 1992  . Esophageal stricture   . ESRD on hemodialysis ([place])    ESRD due to HTN, started dialysis 2011 and gets HD [facility] with Dr Hinda Lenis on MWF schedule.  Access is LUA AVF as of Sept 2014.   Marland Kitchen GERD (gastroesophageal reflux disease)   . High cholesterol 12/2011  . History of blood transfusion 07/2011; 12/2011; 01/2012 X 2; 04/2012  . History of gout   . History of lower GI bleeding   . Hypertension   . Iron deficiency anemia   . Mitral regurgitation    a. Moderate by echo, 02/2012  . Myocardial infarction ([place])   . Ovarian cancer ([place]) 1992  . Pneumonia ~ 2009  . PUD (peptic ulcer disease)   . TIA (transient ischemic attack)     Past Surgical History:  Procedure Laterality Date  . ABDOMINAL HYSTERECTOMY  1992  . APPENDECTOMY  06/1990  . AV FISTULA PLACEMENT  07/2009   left upper arm  . AV FISTULA PLACEMENT Right 09/06/2016   Procedure: RIGHT FOREARM ARTERIOVENOUS (AV) GRAFT;  Surgeon: Elam Dutch, MD;  [facility];  Service: Vascular;  Laterality: Right;  . AV FISTULA PLACEMENT N/A 02/24/2017   Procedure: INSERTION OF ARTERIOVENOUS (AV) GORE-TEX GRAFT ARM (BRACHIAL AXILLARY);  Surgeon: Katha Cabal, MD;  Location: [facility] ORS;  Service: Vascular;  Laterality: N/A;  . [name] Right 09/06/2016   Procedure: REMOVAL OF Right Arm ARTERIOVENOUS GORETEX GRAFT and Vein Patch angioplasty of brachial artery;  Surgeon: Angelia Mould, MD;  Location: [place];  Service: Vascular;  Laterality: Right;  . COLON RESECTION  1992  . COLON SURGERY    . CORONARY ANGIOPLASTY WITH STENT PLACEMENT  12/15/11    "2"  . CORONARY ANGIOPLASTY WITH STENT PLACEMENT  y/2013   "1; makes total of 3" (05/02/2012)  . CORONARY ARTERY BYPASS GRAFT  06/13/2012   Procedure: CORONARY ARTERY BYPASS GRAFTING (CABG);  Surgeon: Grace Isaac, MD;  Location: [place];  Service: Open Heart Surgery;  Laterality: N/A;  cabg x four;  using left internal mammary artery, and left leg greater saphenous vein harvested endoscopically  . CORONARY STENT INTERVENTION N/A 10/13/2016   Procedure: Coronary Stent Intervention;  Surgeon: Troy Sine, MD;  Location: [place] CV LAB;  Service: Cardiovascular;  Laterality: N/A;  . DILATION AND CURETTAGE OF UTERUS    . ESOPHAGOGASTRODUODENOSCOPY  01/20/2012   Procedure: ESOPHAGOGASTRODUODENOSCOPY (EGD);  Surgeon: Ladene Artist, MD,FACG;  Location: [facility] ENDOSCOPY;  Service: Endoscopy;  Laterality: N/A;  . ESOPHAGOGASTRODUODENOSCOPY N/A 03/26/2013   Procedure: ESOPHAGOGASTRODUODENOSCOPY (EGD);  Surgeon: Irene Shipper, MD;  Location: [facility] ENDOSCOPY;  Service: Endoscopy;  Laterality: N/A;  . ESOPHAGOGASTRODUODENOSCOPY N/A 04/30/2015   Procedure: ESOPHAGOGASTRODUODENOSCOPY (EGD);  Surgeon: Rogene Houston, MD;  [facility]O [facility];  Service: Endoscopy;  Laterality: N/A;  1pm - moved to 10/20 @ 1:10  . ESOPHAGOGASTRODUODENOSCOPY N/A 07/29/2016   Procedure: ESOPHAGOGASTRODUODENOSCOPY (EGD);  Surgeon: Manus Gunning, MD;  Location: [place];  Service: Gastroenterology;  Laterality: N/A;  enteroscopy  . INTRAOPERATIVE TRANSESOPHAGEAL ECHOCARDIOGRAM  06/13/2012   Procedure: INTRAOPERATIVE TRANSESOPHAGEAL ECHOCARDIOGRAM;  Surgeon: Grace Isaac, MD;  Location: [place];  Service: Open Heart Surgery;  Laterality: N/A;  . IR GENERIC HISTORICAL  07/26/2016   IR FLUORO GUIDE CV LINE RIGHT 07/26/2016 Greggory Keen, MD [facility]  . IR GENERIC HISTORICAL  07/26/2016   IR US GUIDE VASC ACCESS RIGHT 07/26/2016 Greggory Keen, MD [facility]  . IR GENERIC HISTORICAL  08/02/2016   IR US GUIDE  VASC ACCESS RIGHT 08/02/2016 Greggory Keen, MD [facility]  . IR GENERIC HISTORICAL  08/02/2016   IR FLUORO GUIDE CV LINE RIGHT 08/02/2016 Greggory Keen, MD [facility]  . LEFT HEART CATH AND CORONARY ANGIOGRAPHY N/A 09/20/2016   Procedure: Left Heart Cath and Coronary Angiography;  Surgeon: [name], MD;  Location: [place] CV LAB;  Service: Cardiovascular;  Laterality: N/A;  . LEFT HEART CATH AND CORS/GRAFTS ANGIOGRAPHY N/A 10/13/2016   Procedure: Left Heart Cath and Cors/Grafts Angiography;  Surgeon: Troy Sine, MD;  Location: [place] CV LAB;  Service: Cardiovascular;  Laterality: N/A;  . LEFT HEART CATHETERIZATION WITH CORONARY ANGIOGRAM N/A 12/15/2011   Procedure: LEFT HEART CATHETERIZATION WITH CORONARY ANGIOGRAM;  Surgeon: Burnell Blanks, MD;  Location: [facility] CATH LAB;  Service: Cardiovascular;  Laterality: N/A;  . LEFT HEART CATHETERIZATION WITH CORONARY ANGIOGRAM N/A 01/10/2012   Procedure: LEFT HEART CATHETERIZATION WITH CORONARY ANGIOGRAM;  Surgeon: Peter M Martinique, MD;  Location: [facility] CATH LAB;  Service: Cardiovascular;  Laterality: N/A;  . LEFT HEART CATHETERIZATION WITH CORONARY  ANGIOGRAM N/A 06/08/2012   Procedure: LEFT HEART CATHETERIZATION WITH CORONARY ANGIOGRAM;  Surgeon: Burnell Blanks, MD;  Location: [facility] CATH LAB;  Service: Cardiovascular;  Laterality: N/A;  . LEFT HEART CATHETERIZATION WITH CORONARY/GRAFT ANGIOGRAM N/A 12/10/2013   Procedure: LEFT HEART CATHETERIZATION WITH Beatrix Fetters;  Surgeon: [name], MD;  Location: [facility] CATH LAB;  Service: Cardiovascular;  Laterality: N/A;  . OVARY SURGERY     ovarian cancer  . REVISION OF ARTERIOVENOUS GORETEX GRAFT N/A 02/24/2017   Procedure: REVISION OF ARTERIOVENOUS GORETEX GRAFT (RESECTION);  Surgeon: Katha Cabal, MD;  Location: [facility] ORS;  Service: Vascular;  Laterality: N/A;  [place] N/A 10/15/2013   Procedure: Fistulogram;  Surgeon: Serafina Mitchell, MD;  Location: [facility] CATH LAB;   Service: Cardiovascular;  Laterality: N/A;  . THROMBECTOMY / ARTERIOVENOUS GRAFT REVISION  2011   left upper arm  . TUBAL LIGATION  1980's  . UPPER EXTREMITY ANGIOGRAPHY Bilateral 12/06/2016   Procedure: Upper Extremity Angiography;  Surgeon: Katha Cabal, MD;  Location: [place] CV LAB;  Service: Cardiovascular;  Laterality: Bilateral;    Prior to Admission medications   Medication Sig Start Date End Date Taking? Authorizing Provider  ALPRAZolam (XANAX) 0.25 MG tablet Take 0.25 mg by mouth 2 (two) times daily as needed for anxiety or sleep.  11/19/15  Yes [provider]  amiodarone (PACERONE) 200 MG tablet Take 1 tablet (200 mg total) by mouth daily. 12/08/16  Yes Herminio Commons, MD  aspirin EC 81 MG tablet Take 81 mg by mouth daily.    Yes [provider]  carvedilol (COREG) 6.25 MG tablet Take 1 tablet (6.25 mg total) by mouth 2 (two) times daily with a meal. 11/16/16  Yes Herminio Commons, MD  cinacalcet (SENSIPAR) 30 MG tablet Take 30 mg by mouth daily after supper.   Yes [provider]  cloNIDine (CATAPRES) 0.2 MG tablet Take 1 tablet (0.2 mg total) by mouth 3 (three) times daily. Patient taking differently: Take 0.2 mg by mouth 2 (two) times daily as needed (high blood pressure).  12/08/16  Yes Herminio Commons, MD  diphenhydrAMINE (BENADRYL) 25 mg capsule Take 50 mg by mouth daily as needed for itching or allergies.    Yes [provider]  epoetin alfa (EPOGEN,PROCRIT) 53299 UNIT/ML injection Inject 1 mL (10,000 Units total) into the vein every Monday, Wednesday, and Friday with hemodialysis. 04/11/16  Yes Orvan Falconer, MD  fluticasone (FLONASE) 50 MCG/ACT nasal spray Place 2 sprays into the nose at bedtime as needed for allergies.    Yes [provider]  isosorbide mononitrate (IMDUR) 120 MG 24 hr tablet Take 1 tablet (120 mg total) by mouth daily. 01/21/17  Yes Caren Griffins, MD  multivitamin (RENA-VIT) TABS tablet Take 1  tablet by mouth daily.   Yes [provider]  nitroGLYCERIN (NITROSTAT) 0.4 MG SL tablet Place 1 tablet (0.4 mg total) under the tongue every 5 (five) minutes as needed for chest pain. 08/02/16  Yes Buriev, Arie Sabina, MD  omeprazole (PRILOSEC) 20 MG capsule Take 20 mg by mouth daily.  09/12/16  Yes [provider]  ondansetron (ZOFRAN-ODT) 4 MG disintegrating tablet Take 4 mg by mouth 2 (two) times daily as needed for nausea or vomiting.   Yes [provider]  oxycodone (OXY-IR) 5 MG capsule Take 1 capsule (5 mg total) by mouth every 6 (six) hours as needed for pain. 02/24/17  Yes Schnier, Dolores Lory, MD  sevelamer carbonate (RENVELA)  800 MG tablet Take 800 mg by mouth 3 (three) times daily with meals.   Yes [provider]  simvastatin (ZOCOR) 20 MG tablet TAKE ONE TABLET BY MOUTH AT BEDTIME 02/13/17  Yes Herminio Commons, MD  ticagrelor (BRILINTA) 90 MG TABS tablet Take 90 mg by mouth 2 (two) times daily.   Yes [provider]  hydrALAZINE (APRESOLINE) 50 MG tablet Take 2 tablets (100 mg total) by mouth 3 (three) times daily. Patient taking differently: Take 100 mg by mouth 2 (two) times daily as needed (for high blood pressure readings).  11/10/16 02/20/17  Herminio Commons, MD    Current Facility-Administered Medications  Medication Dose Route Frequency Provider Last Rate Last Dose  . 0.9 %  sodium chloride infusion  100 mL Intravenous PRN Bhutani, Manpreet S, MD      . 0.9 %  sodium chloride infusion  100 mL Intravenous PRN Bhutani, Manpreet S, MD      . 0.9 %  sodium chloride infusion  100 mL Intravenous PRN Befekadu, Belayenh, MD      . 0.9 %  sodium chloride infusion  100 mL Intravenous PRN Fran Lowes, MD      . ALPRAZolam Duanne Moron) tablet 0.25 mg  0.25 mg Oral BID PRN Theodis Blaze, MD      . alteplase (CATHFLO ACTIVASE) injection 2 mg  2 mg Intracatheter Once PRN Liana Gerold, MD      . amiodarone (PACERONE) tablet 200 mg  200 mg  Oral Daily Theodis Blaze, MD   Stopped at 03/24/17 1000  . aspirin EC tablet 81 mg  81 mg Oral Daily Theodis Blaze, MD   Stopped at 03/24/17 1000  . bisacodyl (DULCOLAX) suppository 10 mg  10 mg Rectal Daily PRN Theodis Blaze, MD   10 mg at 03/23/17 1035  . bisacodyl (DULCOLAX) suppository 10 mg  10 mg Rectal q12n4p Aviva Signs, MD      . camphor-menthol [facility]) lotion   Topical PRN Theodis Blaze, MD      . carvedilol (COREG) tablet 6.25 mg  6.25 mg Oral BID WC Theodis Blaze, MD   6.25 mg at 03/24/17 0829  . cinacalcet (SENSIPAR) tablet 30 mg  30 mg Oral QPC supper Theodis Blaze, MD   30 mg at 03/23/17 1710  . cloNIDine (CATAPRES) tablet 0.2 mg  0.2 mg Oral BID PRN Theodis Blaze, MD      . diphenhydrAMINE (BENADRYL) capsule 25 mg  25 mg Oral Q6H PRN Theodis Blaze, MD   25 mg at 03/24/17 0160  . ertapenem (INVANZ) 1 g in sodium chloride 0.9 % 50 mL IVPB  1 g Intravenous Once Ripley Fraise, MD      . ertapenem [facility]) 1 g in sodium chloride 0.9 % 50 mL IVPB  1 g Intravenous Q M,W,F-HD Aviva Signs, MD      . famotidine (PEPCID) tablet 20 mg  20 mg Oral BID Theodis Blaze, MD   Stopped at 03/24/17 1000  . fluticasone (FLONASE) 50 MCG/ACT nasal spray 2 spray  2 spray Each Nare QHS PRN Theodis Blaze, MD      . heparin injection 1,000 Units  1,000 Units Dialysis PRN Liana Gerold, MD   1,000 Units at 03/22/17 1721  . heparin injection 5,000 Units  5,000 Units Subcutaneous Q8H Theodis Blaze, MD   5,000 Units at 03/23/17 1312  . hydrALAZINE (APRESOLINE) injection 5 mg  5 mg  Intravenous Q4H PRN Theodis Blaze, MD   5 mg at 03/22/17 1635  . isosorbide mononitrate (IMDUR) 24 hr tablet 120 mg  120 mg Oral Daily Theodis Blaze, MD   Stopped at 03/24/17 1000  . lidocaine (PF) (XYLOCAINE) 1 % injection 5 mL  5 mL Intradermal PRN Bhutani, Manpreet S, MD      . lidocaine-prilocaine (EMLA) cream 1 application  1 application Topical PRN Bhutani, Manpreet S, MD      . multivitamin (RENA-VIT)  tablet 1 tablet  1 tablet Oral Daily Theodis Blaze, MD   Stopped at 03/24/17 1000  . nitroGLYCERIN (NITROSTAT) SL tablet 0.4 mg  0.4 mg Sublingual Q5 min PRN Theodis Blaze, MD      . ondansetron [facility]) injection 4 mg  4 mg Intravenous Q6H PRN Theodis Blaze, MD      . oxyCODONE (Oxy IR/ROXICODONE) immediate release tablet 5 mg  5 mg Oral Q6H PRN Theodis Blaze, MD   5 mg at 03/24/17 7517  . pentafluoroprop-tetrafluoroeth (GEBAUERS) aerosol 1 application  1 application Topical PRN Bhutani, Manpreet S, MD      . sevelamer carbonate (RENVELA) tablet 800 mg  800 mg Oral TID WC Theodis Blaze, MD   800 mg at 03/24/17 0829  . ticagrelor (BRILINTA) tablet 90 mg  90 mg Oral BID Theodis Blaze, MD   Stopped at 03/24/17 1000    Allergies as of 03/22/2017 - Review Complete 03/22/2017  Allergen Reaction Noted  . Aspirin Other (See Comments) 12/15/2011  . Penicillins Other (See Comments) 12/15/2011  . Amlodipine Swelling 01/19/2015  . Bactrim [sulfamethoxazole-trimethoprim] Rash 12/15/2011  . Contrast media [iodinated diagnostic agents] Itching 12/15/2011  . Iron Itching and Other (See Comments) 05/02/2012  . Nitrofurantoin Hives 05/27/2015  . Tylenol [acetaminophen] Itching and Other (See Comments) 07/08/2016  . Gabapentin Other (See Comments) 02/08/2017  . Dexilant [dexlansoprazole] Other (See Comments) 05/29/2013  . Levaquin [levofloxacin in d5w] Rash 10/04/2013  . Morphine and related Itching 09/16/2014  . Plavix [clopidogrel bisulfate] Rash 01/05/2012  . Protonix [pantoprazole sodium] Rash 03/21/2013  . Venofer [ferric oxide] Itching 05/02/2012    Family History  Problem Relation Age of Onset  . Heart disease Mother        Heart Disease before age 72  . Hyperlipidemia Mother   . Hypertension Mother   . Diabetes Mother   . Heart attack Mother   . Heart disease Father        Heart Disease before age 71  . Hyperlipidemia Father   . Hypertension Father   . Diabetes Father   .  Diabetes Sister   . Hypertension Sister   . Diabetes Brother   . Hyperlipidemia Brother   . Heart attack Brother   . Hypertension Sister   . Heart attack Brother   . Other Unknown        noncontributory for early CAD  . Colon cancer Neg Hx   . Esophageal cancer Neg Hx   . Liver disease Neg Hx   . Kidney disease Neg Hx   . Colon polyps Neg Hx     Social History   Social History  . Marital status: Married    Spouse name: N/A  . Number of children: N/A  . Years of education: N/A   Occupational History  . Not on file.   Social History Main Topics  . Smoking status: Never Smoker  . Smokeless tobacco: Never Used  . Alcohol use No  .  Drug use: No  . Sexual activity: Yes    Birth control/ protection: Surgical   Other Topics Concern  . Not on file   Social History Narrative   Lives in [place], [place] with husband.  Dialysis pt - [facility].    Review of Systems: See HPI, otherwise normal ROS  Physical Exam: Temp:  [98.4 F (36.9 C)-99 F (37.2 C)] 98.4 F (36.9 C) (09/14 1050) Pulse Rate:  [68-77] 70 (09/14 1200) Resp:  [16-20] 16 (09/14 1050) BP: (132-175)/(39-70) 144/70 (09/14 1200) SpO2:  [97 %-100 %] 98 % (09/14 1050) Weight:  [125 lb 14.1 oz (57.1 kg)] 125 lb 14.1 oz (57.1 kg) (09/14 1050) Last BM Date: 03/23/17  Patient is alert in no acute distress. Oropharyngeal mucosa is normal. No neck masses or thyromegaly noted. She has venous access to right subclavian and is receiving hemodialysis. Cardiac exam with regular rhythm and normal S1 and S2. No murmur or gallop noted. Lungs are clear to auscultation. Abdomen is full. Bowel sounds are normal. On palpation abdomen is soft. She has mild generalized tenderness which is more pronounced in midepigastric rion with guarding. No rebound. No organomegaly or masses. No peripheral edema or clubbing noted.    Lab Results:  Recent Labs  03/22/17 0602 03/23/17 0519 03/24/17 0518  WBC 8.6 5.3 6.1  HGB 13.5 10.8* 11.9*   HCT 42.8 33.9* 36.8  PLT 260 201 212   BMET  Recent Labs  03/22/17 0602 03/23/17 0519 03/24/17 0518  NA 138 136 133*  K 5.2* 4.1 4.6  CL 99* 97* 95*  CO2 25 30 26   GLUCOSE 96 71 73  BUN 36* 22* 36*  CREATININE 8.26* 5.84* 8.15*  CALCIUM 8.5* 7.9* 7.9*   LFT  Recent Labs  03/22/17 0602 03/24/17 0518  PROT 7.4  --   ALBUMIN 4.0 3.1*  AST 19  --   ALT 14  --   ALKPHOS 104  --   BILITOT 0.5  --    PT/INR No results for input(s): LABPROT, INR in the last 72 hours. Hepatitis Panel  Recent Labs  03/22/17 0607  HEPBSAG Negative    Studies/Results: CLINICAL DATA:  76 y/o F; generalized abdominal pain and abdominal distention.  EXAM: CT ABDOMEN AND PELVIS WITHOUT CONTRAST  TECHNIQUE: Multidetector CT imaging of the abdomen and pelvis was performed following the standard protocol without IV contrast.  COMPARISON:  11/18/2016 CT of abdomen and pelvis.  FINDINGS: Lower chest: Mild cardiomegaly. Severe coronary artery calcification. Left lower lobe platelike atelectasis.  Hepatobiliary: No focal liver abnormality. Cholelithiasis. No intra or extrahepatic biliary ductal dilatation.  Pancreas: Unremarkable. No pancreatic ductal dilatation or surrounding inflammatory changes.  Spleen: Normal in size without focal abnormality.  Adrenals/Urinary Tract: Normal adrenal glands. Right upper kidney mass measuring 2.9 cm and left kidney interpolar mass measuring 1.6 cm are stable. Severe atrophy of kidneys. No hydronephrosis. Bladder is collapsed.  Stomach/Bowel: Patent rectosigmoid and cecal region anastomosis. Pancolonic diverticulosis. The short segment of small bowel inflammatory changes with edema extending into the mesentery centered around a small focus of air extending beyond the wall of the small bowel (series 5, image 29) which may represent a contained perforation or inflamed small bowel diverticulum. No  bowel obstruction.  Vascular/Lymphatic: Aortic atherosclerosis with severe calcific. No enlarged abdominal or pelvic lymph nodes.  Reproductive: Status post hysterectomy. No adnexal masses.  Other: Small volume of ascites within the left hemiabdomen.  Musculoskeletal: No fracture is seen.  IMPRESSION: 1. Short segment of inflamed  small bowel surrounding a small focus of air extending beyond the bowel wall which may represent a contained perforation or small bowel diverticulitis. Trace ascites is likely reactive. 2. Mild cardiomegaly and severe coronary artery calcification. 3. Cholelithiasis. 4. Stable right kidney upper pole 2.9 cm indeterminate mass and left kidney interpolar 1.6 cm indeterminate mass. Renal protocol CT with and without contrast is recommended to further characterize. This recommendation follows ACR consensus guidelines: Management of the Incidental Renal Mass on CT: A White Paper of the ACR Incidental Findings Committee. J Am Coll Radiol [phone]. 5. Pan colonic diverticulosis.   Electronically Signed   By: Kristine Garbe M.D.   On: 03/22/2017 06:47  I reviewed CT  with Dr. Lavonia Dana.  Assessment;  Patient is 76 year old African-American female with multiple medical problems including coronary artery disease end-stage renal disease o GI bleed secondary to small bowel angiodysplasia who presents with acute onset of abdominal pain& CT reveals focal enteritis and it question of contained perforation. She has extensive calcification to aorta and takeoff of celiac trunk and SMA. These findings are concerning for ischemic injury. She needs to be further evaluated with CT angiogram abdomen pelvis to determine the next step in her treatment. Agree with treating her with IV antibiotics.   Recommendations;  CT angiogram abdomen and pelvis later this afternoon. Since she has history of itching with contrast she would be premedicated with  hydrocortisone 200 mg IV for ow prior to te procedure and by mouth Benadryl 50 mg one ou prior to the pocedure. Dr. Hinda Lenis is aware that she will bereceiving contrast and will undergo short dialysis tomorrow   LOS: 2 days   Najeeb Rehman  03/24/2017, 12:22 PM

## 2017-03-25 DIAGNOSIS — K529 Noninfective gastroenteritis and colitis, unspecified: Principal | ICD-10-CM

## 2017-03-25 DIAGNOSIS — K5792 Diverticulitis of intestine, part unspecified, without perforation or abscess without bleeding: Secondary | ICD-10-CM

## 2017-03-25 LAB — CBC
HEMATOCRIT: 34.3 % — AB (ref 36.0–46.0)
HEMOGLOBIN: 11.3 g/dL — AB (ref 12.0–15.0)
MCH: 33.3 pg (ref 26.0–34.0)
MCHC: 32.9 g/dL (ref 30.0–36.0)
MCV: 101.2 fL — ABNORMAL HIGH (ref 78.0–100.0)
Platelets: 237 10*3/uL (ref 150–400)
RBC: 3.39 MIL/uL — ABNORMAL LOW (ref 3.87–5.11)
RDW: 16.8 % — AB (ref 11.5–15.5)
WBC: 6.4 10*3/uL (ref 4.0–10.5)

## 2017-03-25 LAB — BASIC METABOLIC PANEL
ANION GAP: 10 (ref 5–15)
BUN: 22 mg/dL — ABNORMAL HIGH (ref 6–20)
CALCIUM: 8.1 mg/dL — AB (ref 8.9–10.3)
CHLORIDE: 94 mmol/L — AB (ref 101–111)
CO2: 27 mmol/L (ref 22–32)
Creatinine, Ser: 6.08 mg/dL — ABNORMAL HIGH (ref 0.44–1.00)
GFR calc non Af Amer: 6 mL/min — ABNORMAL LOW (ref >60)
GFR, EST AFRICAN AMERICAN: 7 mL/min — AB (ref >60)
Glucose, Bld: 103 mg/dL — ABNORMAL HIGH (ref 65–99)
Potassium: 4.9 mmol/L (ref 3.5–5.1)
SODIUM: 131 mmol/L — AB (ref 135–145)

## 2017-03-25 MED ORDER — IOPAMIDOL (ISOVUE-300) INJECTION 61%
INTRAVENOUS | Status: AC
  Filled 2017-03-25: qty 30

## 2017-03-25 MED ORDER — HYDROCORTISONE NA SUCCINATE PF 100 MG IJ SOLR
200.0000 mg | Freq: Once | INTRAMUSCULAR | Status: AC
  Administered 2017-03-25: 200 mg via INTRAVENOUS

## 2017-03-25 MED ORDER — HEPARIN SODIUM (PORCINE) 1000 UNIT/ML IJ SOLN
INTRAMUSCULAR | Status: AC
  Filled 2017-03-25: qty 6

## 2017-03-25 NOTE — Procedures (Signed)
    HEMODIALYSIS TREATMENT NOTE:   Extra HD session today due to oral contrast fluid intake. 2 hour heparin-free dialysis completed via right IJ tunneled catheter.  Goal met: 1.7 liters removed without interruption in ultrafiltration.  All blood was returned.  Report given to Janan Ridge, RN.  Rockwell Alexandria, RN, CDN

## 2017-03-25 NOTE — Progress Notes (Signed)
Subjective: Interval History: Her abdominal pain is better this morning. She didn't have any nausea or vomiting. Still she has some soreness.  Objective: Vital signs in last 24 hours: Temp:  [97.9 F (36.6 C)-98.8 F (37.1 C)] 98.5 F (36.9 C) (09/15 0631) Pulse Rate:  [66-81] 66 (09/15 0631) Resp:  [16-20] 16 (09/15 0631) BP: (142-183)/(53-81) 148/57 (09/15 0631) SpO2:  [98 %-100 %] 100 % (09/15 0631) Weight:  [55 kg (121 lb 4.1 oz)-57.2 kg (126 lb 3.2 oz)] 57.2 kg (126 lb 3.2 oz) (09/15 0631) Weight change: 0 kg (0 lb)  Intake/Output from previous day: 09/14 0701 - 09/15 0700 In: 290 [P.O.:240; IV Piggyback:50] Out: 2100  Intake/Output this shift: No intake/output data recorded.  General appearance: alert, cooperative and no distress Resp: clear to auscultation bilaterally Cardio: irregularly irregular rhythm GI: Soft, slight tenderness and positive bowel sound Extremities: No edema  Lab Results:  Recent Labs  03/24/17 0518 03/25/17 0613  WBC 6.1 6.4  HGB 11.9* 11.3*  HCT 36.8 34.3*  PLT 212 237   BMET:   Recent Labs  03/24/17 0518 03/25/17 0613  NA 133* 131*  K 4.6 4.9  CL 95* 94*  CO2 26 27  GLUCOSE 73 103*  BUN 36* 22*  CREATININE 8.15* 6.08*  CALCIUM 7.9* 8.1*   No results for input(s): PTH in the last 72 hours. Iron Studies: No results for input(s): IRON, TIBC, TRANSFERRIN, FERRITIN in the last 72 hours.  Studies/Results: US Venous Img Upper Uni Left  Result Date: 03/23/2017 CLINICAL DATA:  Left hand pain since yesterday. Left hand turning cold for 2 weeks. Patient has a Gore-Tex graft in the lower arm and antecubital fossa region. EXAM: LEFT UPPER EXTREMITY VENOUS DOPPLER ULTRASOUND TECHNIQUE: Gray-scale sonography with graded compression, as well as color Doppler and duplex ultrasound were performed to evaluate the upper extremity deep venous system from the level of the subclavian vein and including the jugular, axillary, basilic, radial, ulnar  and upper cephalic vein. Spectral Doppler was utilized to evaluate flow at rest and with distal augmentation maneuvers. COMPARISON:  None. FINDINGS: Contralateral Subclavian Vein: Respiratory phasicity is normal and symmetric with the symptomatic side. No evidence of thrombus. Normal compressibility. Internal Jugular Vein: No evidence of thrombus. Normal compressibility, respiratory phasicity and response to augmentation. Subclavian Vein: No evidence of thrombus. Normal compressibility, respiratory phasicity and response to augmentation. Axillary Vein: No evidence of thrombus. Normal compressibility, respiratory phasicity and response to augmentation. Cephalic Vein: Irregular hypoechoic to mildly hyperechoic area in the left forearm near the patient's dialysis graft that may reflect a chronically thrombosed cephalic vein. Basilic Vein: No evidence of thrombus. Normal compressibility, respiratory phasicity and response to augmentation. Brachial Veins: No evidence of thrombus. Normal compressibility, respiratory phasicity and response to augmentation. Radial Veins: No evidence of thrombus. Normal compressibility, respiratory phasicity and response to augmentation. Ulnar Veins: No evidence of thrombus. Normal compressibility, respiratory phasicity and response to augmentation. Venous Reflux:  None visualized. Other Findings:  The dialysis fistula is widely patent. IMPRESSION: 1. Probable chronic thrombosis in the cephalic vein in the patient's dialysis fistula in the left arm. 2. Remaining veins are widely patent. Dialysis fistula is widely patent. Electronically Signed   By: Lajean Manes M.D.   On: 03/23/2017 15:55    I have reviewed the patient's current medications.  Assessment/Plan: Problem #1 abdominal pain, nausea. Presently patient is feeling better. At this moment etiology was thought to be possibly secondary to ileitis. Blood culture no growth. Patient is on  antibiotics. This morning she didn't have any  pain. Problem #2 end-stage renal disease: She is status post hemodialysis yesterday. Patient presently does not have any nausea or vomiting. Problem #3 anemia: Her hemoglobin is with in  target goal. She is on Neupogen Problem #4 coronary artery disease: Presently patient is asymptomatic and chest pain Problem #5 fluid management: No sign of fluid overload Problem #6 bone and mineral disorder: Her calcium  and phosphorus is range Problem #7 history of renal mass Problem #8 hypertension: Her blood pressure is reasonably controlled Plan: 1] We'll make arrangements for patient to get dialysis today if she is going to get IV contrast. Otherwise we'll wait until Monday. 2]  we'll dialyze her for 2 hours if CT scan was contrast is done today. we'll remove 03/0 L his systolic blood pressure remains above 90.  3] We'll check her renal panel and CBC in the morning.    LOS: 3 days   Lexie Koehl S 03/25/2017,8:28 AM

## 2017-03-25 NOTE — Progress Notes (Signed)
Patient ID: Shawna [name], female   DOB: September 13, 1940, 76 y.o.   MRN: [id number]    PROGRESS NOTE  Shawna [name]  DUK:[id number] DOB: 02-Jun-1941 DOA: 03/22/2017  PCP: Shawna Percy, MD   Brief Narrative:  Patient is 76 year old female with multiple chronic medical conditions, including ESRD on HD MWF, CAD, HTN, diastolic CHF, presented with main concern of 2 days duration of progressively worsening lower quadrants abdominal pain associated with nausea and few days of nonbloody diarrhea that has since resolved.  Assessment & Plan:   Lower quadrants abdominal pain - Initial thought was enteritis as underlying etiology however, patient continues to experience intermittent episodes of epigastric pain despite use of antibiotics - Now with concern of possible underlying mesenteric ischemia - Appreciate assistance of the GI and surgery teams - Plan for CTA of the abdomen for further evaluation - Pending results of CTA, may need transfer to call  End-stage renal disease, hyperkalemia - On hemodialysis Monday Wednesday and Friday - Appreciate nephrologist Dr. Florentina Addison assistance  Left upper extremity pain - Mostly hand, concern with skin discoloration, perfusion - Upper extremity Doppler notable for probable chronic thrombosis in the cephalic vein in the patient's dialysis fistula in the left arm, remaining veins are patent, dialysis fistula is widely patent  Hypertension, Essential - At home taking Coreg, Imdur scheduled, also taking clonidine and hydralazine as needed - Continue Coreg and Imdur - Systolic blood pressure in 140s  Anemia of chronic disease, ESRD - Goal hemoglobin 9-11 - Hemoglobin overall stable, no evidence of active bleeding  CAD, NSTEMI in 2013 - Continue aspirin - Patient also on amiodarone, Brilinta   Renal mass - noted on right and left kidney - once acute problem resolved, can proceed with further evaluation with renal protocol CT  DVT prophylaxis:  Heparin  subcutaneous Code Status: Full Family Communication: Patient and husband at bedside Disposition Plan: To be determined, pending CTA results  Consultants:   Nephrology  Surgery  Gastroenterology  Procedures:   None  Antimicrobials:   Ertapenem 03/22/2017 -->  Subjective: Patient reports feeling better this morning, reports soreness in the epigastric area after drinking broth this morning.  Objective: Vitals:   03/24/17 1500 03/24/17 2021 03/24/17 2222 03/25/17 0631  BP: (!) 158/53  (!) 163/65 (!) 148/57  Pulse: 77  70 66  Resp: 18  20 16   Temp: 98.8 F (37.1 C)  98.5 F (36.9 C) 98.5 F (36.9 C)  TempSrc: Oral  Oral   SpO2: 98% 98% 100% 100%  Weight:    57.2 kg (126 lb 3.2 oz)  Height:        Intake/Output Summary (Last 24 hours) at 03/25/17 1031 Last data filed at 03/24/17 1528  Gross per 24 hour  Intake               50 ml  Output             2100 ml  Net            -2050 ml   Filed Weights   03/24/17 1050 03/24/17 1425 03/25/17 0631  Weight: 57.1 kg (125 lb 14.1 oz) 55 kg (121 lb 4.1 oz) 57.2 kg (126 lb 3.2 oz)   Physical Exam  Constitutional: Appears Calm, not in acute distress Pulmonary: Effort and breath sounds normal, no stridor, rhonchi, wheezes, rales.  Abdominal: Soft. BS +,  no distension, slightly tender in epigastric area  Musculoskeletal: Normal range of motion. No edema and no tenderness.  Neuro:  Alert. Normal reflexes, muscle tone coordination. No cranial nerve deficit. Psychiatric: Normal mood and affect. Behavior, judgment, thought content normal.   Data Reviewed: I have personally reviewed following labs and imaging studies  CBC:  Recent Labs Lab 03/22/17 0602 03/23/17 0519 03/24/17 0518 03/25/17 0613  WBC 8.6 5.3 6.1 6.4  NEUTROABS 7.2  --   --   --   HGB 13.5 10.8* 11.9* 11.3*  HCT 42.8 33.9* 36.8 34.3*  MCV 105.2* 105.0* 103.1* 101.2*  PLT 260 201 212 222   Basic Metabolic Panel:  Recent Labs Lab 03/22/17 0602  03/23/17 0519 03/24/17 0518 03/25/17 0613  NA 138 136 133* 131*  K 5.2* 4.1 4.6 4.9  CL 99* 97* 95* 94*  CO2 25 30 26 27   GLUCOSE 96 71 73 103*  BUN 36* 22* 36* 22*  CREATININE 8.26* 5.84* 8.15* 6.08*  CALCIUM 8.5* 7.9* 7.9* 8.1*  PHOS  --   --  5.2*  --    Liver Function Tests:  Recent Labs Lab 03/22/17 0602 03/24/17 0518  AST 19  --   ALT 14  --   ALKPHOS 104  --   BILITOT 0.5  --   PROT 7.4  --   ALBUMIN 4.0 3.1*    Recent Labs Lab 03/22/17 0602  LIPASE 31   Urine analysis:    Component Value Date/Time   COLORURINE YELLOW 12/16/2012 1919   APPEARANCEUR CLOUDY (A) 12/16/2012 1919   LABSPEC 1.009 12/16/2012 1919   PHURINE 7.5 12/16/2012 1919   GLUCOSEU NEGATIVE 12/16/2012 1919   HGBUR TRACE (A) 12/16/2012 1919   BILIRUBINUR NEGATIVE 12/16/2012 1919   KETONESUR NEGATIVE 12/16/2012 1919   PROTEINUR 100 (A) 12/16/2012 1919   UROBILINOGEN 0.2 12/16/2012 1919   NITRITE NEGATIVE 12/16/2012 1919   LEUKOCYTESUR SMALL (A) 12/16/2012 1919   Recent Results (from the past 240 hour(s))  MRSA PCR Screening     Status: None   Collection Time: 03/22/17  8:51 AM  Result Value Ref Range Status   MRSA by PCR NEGATIVE NEGATIVE Final    Comment:        The GeneXpert MRSA Assay (FDA approved for NASAL specimens only), is one component of a comprehensive MRSA colonization surveillance program. It is not intended to diagnose MRSA infection nor to guide or monitor treatment for MRSA infections.     Radiology Studies: US Venous Img Upper Uni Left  Result Date: 03/23/2017 CLINICAL DATA:  Left hand pain since yesterday. Left hand turning cold for 2 weeks. Patient has a Gore-Tex graft in the lower arm and antecubital fossa region. EXAM: LEFT UPPER EXTREMITY VENOUS DOPPLER ULTRASOUND TECHNIQUE: Gray-scale sonography with graded compression, as well as color Doppler and duplex ultrasound were performed to evaluate the upper extremity deep venous system from the level of the  subclavian vein and including the jugular, axillary, basilic, radial, ulnar and upper cephalic vein. Spectral Doppler was utilized to evaluate flow at rest and with distal augmentation maneuvers. COMPARISON:  None. FINDINGS: Contralateral Subclavian Vein: Respiratory phasicity is normal and symmetric with the symptomatic side. No evidence of thrombus. Normal compressibility. Internal Jugular Vein: No evidence of thrombus. Normal compressibility, respiratory phasicity and response to augmentation. Subclavian Vein: No evidence of thrombus. Normal compressibility, respiratory phasicity and response to augmentation. Axillary Vein: No evidence of thrombus. Normal compressibility, respiratory phasicity and response to augmentation. Cephalic Vein: Irregular hypoechoic to mildly hyperechoic area in the left forearm near the patient's dialysis graft that may reflect a chronically thrombosed cephalic vein.  Basilic Vein: No evidence of thrombus. Normal compressibility, respiratory phasicity and response to augmentation. Brachial Veins: No evidence of thrombus. Normal compressibility, respiratory phasicity and response to augmentation. Radial Veins: No evidence of thrombus. Normal compressibility, respiratory phasicity and response to augmentation. Ulnar Veins: No evidence of thrombus. Normal compressibility, respiratory phasicity and response to augmentation. Venous Reflux:  None visualized. Other Findings:  The dialysis fistula is widely patent. IMPRESSION: 1. Probable chronic thrombosis in the cephalic vein in the patient's dialysis fistula in the left arm. 2. Remaining veins are widely patent. Dialysis fistula is widely patent. Electronically Signed   By: Lajean Manes M.D.   On: 03/23/2017 15:55    Scheduled Meds: . amiodarone  200 mg Oral Daily  . aspirin EC  81 mg Oral Daily  . bisacodyl  10 mg Rectal q12n4p  . carvedilol  6.25 mg Oral BID WC  . cinacalcet  30 mg Oral QPC supper  . diphenhydrAMINE  50 mg Oral  Once  . [START ON 03/26/2017] famotidine  20 mg Oral QODAY  . heparin      . heparin  5,000 Units Subcutaneous Q8H  . isosorbide mononitrate  120 mg Oral Daily  . multivitamin  1 tablet Oral Daily  . sevelamer carbonate  800 mg Oral TID WC  . ticagrelor  90 mg Oral BID   Continuous Infusions: . sodium chloride    . sodium chloride    . sodium chloride    . sodium chloride    . ertapenem    . ertapenem Stopped (03/24/17 1558)     LOS: 3 days    Time spent: 25 minutes    Faye Ramsay, MD [facility] Pager [phone]  If 7PM-7AM, please contact night-coverage [email or web address] Password [facility] 03/25/2017, 10:31 AM

## 2017-03-25 NOTE — Progress Notes (Addendum)
Patient ID: Shawna [name], female   DOB: 10/16/1940, 76 y.o.   MRN: [id number]  1519: UNABLE TO COMPLETE CT VIA DIALYSIS CATHETER. PT WILL NEED ABD DOPPLER IN AM SEP 16. MONITOR FOR SIGNS/SYMPTOMS OF ACUTE ABDOMEN. REMAIN NPO EXCEPT SIPS WITH MEDS AND ICE CHIPS.   Assessment/Plan: ADMITTED WITH ACUTE ONSET OF SEVERE PERIUMBILICAL ABDOMINAL PAIN. CT SHOWS ENTERITIS-DIVERTICULITIS/POSSIBLE PERFORATION V. ISCHEMIC ENTERITIS. CLINICALLY IMPROVED.  PLAN: 1. AWAIT CT ANGIO. CONSIDER TRANSFER TO [place] FOR IR/STENT PLACEMENT OR [facility] FOR VASCULAR INTERVENTION(DR. Carroll Kinds) IF NEEDED. 2. DIALYSIS TODAY. 3. NPO EXCEPT MEDS AND ICE CHIPS UNTIL CT READ. 4. PT ALLERGIC TO PROTONIX BUT TOLERATE OMEPRAZOLE. ASKED PT TO BRING MEDS FROM HOME. ORDER WRITTEN FOR PT TO TAKE HOME MEDS. 5. CONTINUE INVANZ.  GREATER THAN 50% WAS SPENT IN COUNSELING & COORDINATION OF CARE WITH THE PATIENT: DISCUSSED DIFFERENTIAL DIAGNOSIS, PROCEDURE, BENEFITS, RISKS, AND MANAGEMENT OF ENTERITIS-INFECTIOUS V. ISCHEMIC. TOTAL ENCOUNTER TIME: 35 MINS.       Subjective: Since last evaluated the patient CONTINUES WITH ABDOMINAL PAIN BUT IT IS BETTER. CONTINUES WITH INCREASED ABDOMINAL PAIN A FEW MINS AFTER SHE EATS BROTH OR JELLO. AWAITING CT ANGIO. HAS A VASCULAR SURGEON IN [place] (DR. Erlene Quan). CT DELAYED DUE TO POOR IV ACCESS. RECEIVED HCT AND AWAITING BENADRYL PRIOR TO CT.  WILL HAVE DIALYSIS TODAY.  ABDOMINAL PAIN STARTED TUES: SUDDEN ONSET, PERIUMBILICAL. PMHx: CAD/CVA. NO FEVER, CHILLS, CHEST PAIN, NAUSEA, OR VOMITING. HAD A LOOSE STOOL YESTERDAY(NO BLOOD). FEELS SHORT WINDED.   Objective: Vital signs in last 24 hours: Vitals:   03/24/17 2222 03/25/17 0631  BP: (!) 163/65 (!) 148/57  Pulse: 70 66  Resp: 20 16  Temp: 98.5 F (36.9 C) 98.5 F (36.9 C)  SpO2: 100% 100%   General appearance: alert, cooperative and no distress Resp: clear to auscultation bilaterally Cardio: regular rate and rhythm GI: soft, MILD TO  MODERATE tenderness in the periumbilical and epigastric region; rebound present, no guarding, bowel sounds normal; no masses,  no organomegaly Extremities: extremities normal, atraumatic, no edema, pIV IN RIGHT WRIST, FISTULA IN LEFT UPPER ARM,  CHEST: PERM CATH IN LEFT CHEST  Lab Results:  K 4.9 Cr 6.08 WBC 6.4 Hb 11.3 PLT CT 237    Studies/Results: CT PENDING  Medications: I have reviewed the patient's current medications.   LOS: 5 days   Barney Drain 12/19/2013, 2:23 PM

## 2017-03-25 NOTE — Progress Notes (Addendum)
Dr. Oneida Alar notified that CT was not done due to the dialysis catheter could not be used for injection of CT dye as stated by the CT tech.

## 2017-03-26 ENCOUNTER — Inpatient Hospital Stay (HOSPITAL_COMMUNITY): Payer: Medicare HMO

## 2017-03-26 DIAGNOSIS — K559 Vascular disorder of intestine, unspecified: Secondary | ICD-10-CM

## 2017-03-26 DIAGNOSIS — R109 Unspecified abdominal pain: Secondary | ICD-10-CM

## 2017-03-26 LAB — RENAL FUNCTION PANEL
Albumin: 3 g/dL — ABNORMAL LOW (ref 3.5–5.0)
Anion gap: 13 (ref 5–15)
BUN: 23 mg/dL — ABNORMAL HIGH (ref 6–20)
CALCIUM: 8.5 mg/dL — AB (ref 8.9–10.3)
CO2: 27 mmol/L (ref 22–32)
CREATININE: 5.89 mg/dL — AB (ref 0.44–1.00)
Chloride: 92 mmol/L — ABNORMAL LOW (ref 101–111)
GFR calc non Af Amer: 6 mL/min — ABNORMAL LOW (ref >60)
GFR, EST AFRICAN AMERICAN: 7 mL/min — AB (ref >60)
Glucose, Bld: 66 mg/dL (ref 65–99)
Phosphorus: 5.4 mg/dL — ABNORMAL HIGH (ref 2.5–4.6)
Potassium: 4.5 mmol/L (ref 3.5–5.1)
SODIUM: 132 mmol/L — AB (ref 135–145)

## 2017-03-26 MED ORDER — SODIUM CHLORIDE 0.9 % IV SOLN
1.0000 g | INTRAVENOUS | Status: DC
  Administered 2017-03-26: 1 g via INTRAVENOUS
  Filled 2017-03-26: qty 1

## 2017-03-26 NOTE — Progress Notes (Signed)
Gave report to [facility] 2W nurse

## 2017-03-26 NOTE — Progress Notes (Addendum)
Patient ID: Shawna [name], female   DOB: 08-12-1940, 76 y.o.   MRN: [id number]    Assessment/Plan: ADMITTED WITH POSTPRANDIAL ABDOMINAL PAIN AND CHANGES ON CT SHOWING ENTERITIS V. SMALL BOWEL DIVERTICULITIS. UNABLE TO ADVANCE DIET DUE TO PAIN AFTER EATING ABDOMINAL DOPPLER SHOWS 3 VESSEL MESENTERIC STENOSIS. CALLED DR. Earleen Newport [facility] IR TO [id number]. RETURN CALL: [phone]  PLAN: 1. PT DISCUSSED WITH DR. Earleen Newport.  Accepted pt for IR INTERVENTION. NEEDS TRANSFER TODAY TO [facility]. DR Doyle Askew HAS ARRANGED FOR TRANSFER 1138. 2. PT SHOULD REMAIN NPO EXCEPT SIPS WITH MEDS AND ICE CHIPS. 3. CONTINUE INVANZ.    Subjective: Since I last evaluated the patient abdominal pain is improved. NO NAUSEA OR VOMITING. NO CHEST PAIN OR SOB. LAST BM: YESTERDAY AFTER DULCOLAX (WATERY, NO BLOOD)  Objective: Vital signs in last 24 hours: Vitals:   03/25/17 2151 03/26/17 0645  BP: (!) 148/57 (!) 138/54  Pulse: 80 66  Resp: 18 18  Temp: 98.3 F (36.8 C) 98 F (36.7 C)  SpO2: 100% 97%   General appearance: alert, cooperative and no distress Resp: clear to auscultation bilaterally Cardio: regular rate and rhythm GI: soft, MILDLY TENDER IN EPIGASTRIUM, MILD REBOUND, NO GUARDING, bowel sounds normal; no BRUITS  Lab Results:  K 4.5 Cr 5.89   Studies/Results: DOPPLER ABDOMEN SEP 16- 3 VESSEL MESENTERIC STENOSIS.   Medications: I have reviewed the patient's current medications. MEDS: ASA/BRILINTA, HEPARIN WITH DIALYSIS SEP 15   LOS: 5 days   Charlene Detter 12/19/2013, 2:23 PM

## 2017-03-26 NOTE — Progress Notes (Signed)
Subjective: No abdominal pain at present time.  Objective: Vital signs in last 24 hours: Temp:  [98 F (36.7 C)-98.3 F (36.8 C)] 98 F (36.7 C) (09/16 0645) Pulse Rate:  [66-80] 66 (09/16 0645) Resp:  [16-18] 18 (09/16 0645) BP: (138-240)/(41-107) 138/54 (09/16 0645) SpO2:  [97 %-100 %] 97 % (09/16 0645) Weight:  [121 lb 7.6 oz (55.1 kg)-126 lb 1.7 oz (57.2 kg)] 125 lb (56.7 kg) (09/16 0645) Last BM Date: 03/23/17  Intake/Output from previous day: 09/15 0701 - 09/16 0700 In: 480 [P.O.:480] Out: 1700  Intake/Output this shift: No intake/output data recorded.  General appearance: alert, cooperative and no distress GI: Soft, no rigidity noted. Nondistended. No specific point tenderness noted.  Lab Results:   Recent Labs  03/24/17 0518 03/25/17 0613  WBC 6.1 6.4  HGB 11.9* 11.3*  HCT 36.8 34.3*  PLT 212 237   BMET  Recent Labs  03/25/17 0613 03/26/17 0801  NA 131* 132*  K 4.9 4.5  CL 94* 92*  CO2 27 27  GLUCOSE 103* 66  BUN 22* 23*  CREATININE 6.08* 5.89*  CALCIUM 8.1* 8.5*   PT/INR No results for input(s): LABPROT, INR in the last 72 hours.  Studies/Results: US Abdominal Pelvic Art/vent Flow Doppler  Result Date: 03/26/2017 CLINICAL DATA:  76 year old female with possible mesenteric ischemia. End-stage renal disease Cardiovascular risk factors include hypertension, known prior coronary artery disease, known prior vascular disease with vascular surgery. EXAM: [place] MESENTERIC ARTERIAL DOPPLER COMPARISON:  Noncontrast CT 03/22/2017, 11/18/2016 FINDINGS: Celiac axis: 262 cm/sec Celiac axis with inspiration: 220 cm/sec Celiac axis with expiration: 262 cm/sec Splenic artery: Splenic artery not visualize Hepatic artery: Hepatic artery not visualized SMA: 433 cm/sec IMA: 375 cm/sec IMPRESSION: Elevated velocities at the origin of the celiac artery, superior mesenteric artery, inferior mesenteric artery, compatible with greater than 70% stenosis at all 3 mesenteric  vessels origin. Signed, Dulcy Fanny. Earleen Newport, DO [facility] Radiology Electronically Signed   By: Corrie Mckusick D.O.   On: 03/26/2017 10:46    Anti-infectives: Anti-infectives    Start     Dose/Rate Route Frequency Ordered Stop   03/24/17 1200  ertapenem (INVANZ) 1 g in sodium chloride 0.9 % 50 mL IVPB     1 g 100 mL/hr over 30 Minutes Intravenous Every M-W-F (Hemodialysis) 03/23/17 0830     03/22/17 0745  ertapenem (INVANZ) 1 g in sodium chloride 0.9 % 50 mL IVPB     1 g 100 mL/hr over 30 Minutes Intravenous  Once 03/22/17 0735     03/22/17 0715  clindamycin (CLEOCIN) IVPB 600 mg  Status:  Discontinued     600 mg 100 mL/hr over 30 Minutes Intravenous  Once 03/22/17 0700 03/22/17 0748   03/22/17 0700  metroNIDAZOLE (FLAGYL) IVPB 500 mg  Status:  Discontinued     500 mg 100 mL/hr over 60 Minutes Intravenous  Once 03/22/17 0657 03/22/17 0658   03/22/17 0700  clindamycin (CLEOCIN) IVPB 600 mg  Status:  Discontinued     600 mg 100 mL/hr over 30 Minutes Intravenous  Once 03/22/17 0658 03/22/17 0700      Assessment/Plan: Impression: Ultrasound of mesenteric vessels show more than 70% stenosis of all 3 vessels at their origins: celiac trunk, SMA, and IMA.  Discussed with Dr. Earleen Newport of interventional radiology who agrees that stenting may be indicated due to her mesenteric angina. Patient will be transferred down to [facility] today for further management and treatment.  LOS: 4 days  Aviva Signs 03/26/2017

## 2017-03-26 NOTE — Progress Notes (Signed)
Subjective: Interval History: Patient is feeling much better. She didn't have any nausea or vomiting. Patient also denies any abdominal pain. At this moment she is asking me to eat.  Objective: Vital signs in last 24 hours: Temp:  [98 F (36.7 C)-98.3 F (36.8 C)] 98 F (36.7 C) (09/16 0645) Pulse Rate:  [66-80] 66 (09/16 0645) Resp:  [16-18] 18 (09/16 0645) BP: (138-240)/(41-107) 138/54 (09/16 0645) SpO2:  [97 %-100 %] 97 % (09/16 0645) Weight:  [55.1 kg (121 lb 7.6 oz)-57.2 kg (126 lb 1.7 oz)] 56.7 kg (125 lb) (09/16 0645) Weight change: 0.1 kg (3.5 oz)  Intake/Output from previous day: 09/15 0701 - 09/16 0700 In: 480 [P.O.:480] Out: 1700  Intake/Output this shift: No intake/output data recorded.  General appearance: alert, cooperative and no distress Resp: clear to auscultation bilaterally Cardio: irregularly irregular rhythm GI: Soft, slight tenderness and positive bowel sound Extremities: No edema  Lab Results:  Recent Labs  03/24/17 0518 03/25/17 0613  WBC 6.1 6.4  HGB 11.9* 11.3*  HCT 36.8 34.3*  PLT 212 237   BMET:   Recent Labs  03/25/17 0613 03/26/17 0801  NA 131* 132*  K 4.9 4.5  CL 94* 92*  CO2 27 27  GLUCOSE 103* 66  BUN 22* 23*  CREATININE 6.08* 5.89*  CALCIUM 8.1* 8.5*   No results for input(s): PTH in the last 72 hours. Iron Studies: No results for input(s): IRON, TIBC, TRANSFERRIN, FERRITIN in the last 72 hours.  Studies/Results: No results found.  I have reviewed the patient's current medications.  Assessment/Plan: Problem #1 abdominal pain, nausea. Presently patient is feeling better. Patient is feeling better. CT scan was contrast was not done yesterday because of lack of access for administration of the dye. Problem #2 end-stage renal disease: She is status post hemodialysis the last 2 days. Her potassium is normal and she is asymptomatic. Problem #3 anemia: Her hemoglobin remains  with in  target goal.  Problem #4 coronary  artery disease: Presently patient is asymptomatic and chest pain Problem #5 fluid management: No sign of fluid overload. We're able to get about 1.5 L with dialysis yesterday. Problem #6 bone and mineral disorder: Her calcium  and phosphorus is range Problem #7 history of renal mass Problem #8 hypertension: Her blood pressure is reasonably controlled Plan: 1] patient doesn't require dialysis. Dialysis will be on Tuesday. 2] We'll check her renal panel and CBC in the morning.    LOS: 4 days   Seraphine Gudiel S 03/26/2017,9:08 AM

## 2017-03-26 NOTE — Progress Notes (Signed)
Pt arrived from unit from [facility] via [facility]. Alert and oriented, RA, VSS. Pt was placed on telemetry, box 13, CCMD notified. Pt was oriented to unit, room and staff. Call bell and telephone within reach. Orders have been reviewed and implemented. Will continue to monitor.

## 2017-03-26 NOTE — Progress Notes (Signed)
Attempted to call report to [facility] 2W nurse. Said they would call me back.

## 2017-03-26 NOTE — Progress Notes (Signed)
Patient ID: Shawna [name], female   DOB: [birth year], 76 y.o.   MRN: [id number]    PROGRESS NOTE  Shawna [name]  WUJ:[id number] DOB: 1940-12-21 DOA: 03/22/2017  PCP: [name], MD   Brief Narrative:  Patient is 76 year old female with multiple chronic medical conditions, including ESRD on HD MWF, CAD, HTN, diastolic CHF, presented with main concern of 2 days duration of progressively worsening lower quadrants abdominal pain associated with nausea and few days of nonbloody diarrhea that has since resolved.  Assessment & Plan:   Lower quadrants abdominal pain - Initial thought was enteritis vs ileitis however, patient continues to experience intermittent episodes of epigastric pain despite use of antibiotics - Now with concern of possible underlying mesenteric ischemia - Appreciate assistance of the GI and surgery teams - unable to complete CTA as no IV access for administration of dye - plan for abd doppler this AM  End-stage renal disease, hyperkalemia - On hemodialysis Monday Wednesday and Friday - Appreciate nephrologist Dr. Florentina Addison assistance  Left upper extremity pain - Mostly hand, concern with skin discoloration, perfusion - Upper extremity Doppler notable for probable chronic thrombosis in the cephalic vein in the patient's dialysis fistula in the left arm, remaining veins are patent, dialysis fistula is widely patent  Hypertension, Essential (?) - At home taking Coreg, Imdur scheduled, also taking clonidine and hydralazine as needed - Continue Coreg and Imdur - BP reasonably stable this AM   Anemia of chronic disease, ESRD - Goal hemoglobin 9-11 - Hg overall stable   CAD, NSTEMI in 2013 - Continue aspirin - Patient also on amiodarone, Brilinta   Renal mass - noted on right and left kidney - once acute problem resolved, can proceed with further evaluation with renal protocol CT if pt desires (so far has declined)  DVT prophylaxis:  Heparin subcutaneous Code  Status: Full Family Communication: pt and husband at bedside  Disposition Plan: to be determined once abd doppler results back   Consultants:   Nephrology  Surgery  Gastroenterology  Procedures:   None  Antimicrobials:   Ertapenem 03/22/2017 -->  Subjective: Pt reports feeling better and wants to eat.   Objective: Vitals:   03/25/17 1755 03/25/17 1931 03/25/17 2151 03/26/17 0645  BP: (!) 200/91 (!) 156/41 (!) 148/57 (!) 138/54  Pulse: 78 76 80 66  Resp: 16  18 18   Temp: 98.1 F (36.7 C)  98.3 F (36.8 C) 98 F (36.7 C)  TempSrc: Oral  Oral Oral  SpO2: 100%  100% 97%  Weight: 55.1 kg (121 lb 7.6 oz)   56.7 kg (125 lb)  Height:        Intake/Output Summary (Last 24 hours) at 03/26/17 0936 Last data filed at 03/25/17 1745  Gross per 24 hour  Intake              240 ml  Output             1700 ml  Net            -1460 ml   Filed Weights   03/25/17 1540 03/25/17 1755 03/26/17 0645  Weight: 57.2 kg (126 lb 1.7 oz) 55.1 kg (121 lb 7.6 oz) 56.7 kg (125 lb)   Physical Exam  Constitutional: Appears calm and NAD CVS: RRR, S1/S2 +, no murmurs, no gallops, no carotid bruit.  Pulmonary: Effort and breath sounds normal, no stridor, rhonchi, wheezes, rales.  Abdominal: Soft. BS +,  no distension, tenderness, rebound or guarding.  Musculoskeletal: Normal  range of motion. No edema and no tenderness.   Data Reviewed: I have personally reviewed following labs and imaging studies  CBC:  Recent Labs Lab 03/22/17 0602 03/23/17 0519 03/24/17 0518 03/25/17 0613  WBC 8.6 5.3 6.1 6.4  NEUTROABS 7.2  --   --   --   HGB 13.5 10.8* 11.9* 11.3*  HCT 42.8 33.9* 36.8 34.3*  MCV 105.2* 105.0* 103.1* 101.2*  PLT 260 201 212 478   Basic Metabolic Panel:  Recent Labs Lab 03/22/17 0602 03/23/17 0519 03/24/17 0518 03/25/17 0613 03/26/17 0801  NA 138 136 133* 131* 132*  K 5.2* 4.1 4.6 4.9 4.5  CL 99* 97* 95* 94* 92*  CO2 25 30 26 27 27   GLUCOSE 96 71 73 103* 66  BUN  36* 22* 36* 22* 23*  CREATININE 8.26* 5.84* 8.15* 6.08* 5.89*  CALCIUM 8.5* 7.9* 7.9* 8.1* 8.5*  PHOS  --   --  5.2*  --  5.4*   Liver Function Tests:  Recent Labs Lab 03/22/17 0602 03/24/17 0518 03/26/17 0801  AST 19  --   --   ALT 14  --   --   ALKPHOS 104  --   --   BILITOT 0.5  --   --   PROT 7.4  --   --   ALBUMIN 4.0 3.1* 3.0*    Recent Labs Lab 03/22/17 0602  LIPASE 31   Urine analysis:    Component Value Date/Time   COLORURINE YELLOW 12/16/2012 1919   APPEARANCEUR CLOUDY (A) 12/16/2012 1919   LABSPEC 1.009 12/16/2012 1919   PHURINE 7.5 12/16/2012 1919   GLUCOSEU NEGATIVE 12/16/2012 1919   HGBUR TRACE (A) 12/16/2012 1919   BILIRUBINUR NEGATIVE 12/16/2012 1919   KETONESUR NEGATIVE 12/16/2012 1919   PROTEINUR 100 (A) 12/16/2012 1919   UROBILINOGEN 0.2 12/16/2012 1919   NITRITE NEGATIVE 12/16/2012 1919   LEUKOCYTESUR SMALL (A) 12/16/2012 1919   Recent Results (from the past 240 hour(s))  MRSA PCR Screening     Status: None   Collection Time: 03/22/17  8:51 AM  Result Value Ref Range Status   MRSA by PCR NEGATIVE NEGATIVE Final    Comment:        The GeneXpert MRSA Assay (FDA approved for NASAL specimens only), is one component of a comprehensive MRSA colonization surveillance program. It is not intended to diagnose MRSA infection nor to guide or monitor treatment for MRSA infections.     Radiology Studies: No results found.  Scheduled Meds: . amiodarone  200 mg Oral Daily  . aspirin EC  81 mg Oral Daily  . bisacodyl  10 mg Rectal q12n4p  . carvedilol  6.25 mg Oral BID WC  . cinacalcet  30 mg Oral QPC supper  . famotidine  20 mg Oral QODAY  . heparin  5,000 Units Subcutaneous Q8H  . isosorbide mononitrate  120 mg Oral Daily  . multivitamin  1 tablet Oral Daily  . sevelamer carbonate  800 mg Oral TID WC  . ticagrelor  90 mg Oral BID   Continuous Infusions: . sodium chloride    . sodium chloride    . sodium chloride    . sodium chloride     . ertapenem    . ertapenem Stopped (03/24/17 1558)     LOS: 4 days    Time spent: 25 minutes    Faye Ramsay, MD [facility] Pager [phone]  If 7PM-7AM, please contact night-coverage [email or web address] Password [facility] 03/26/2017, 9:36 AM

## 2017-03-27 DIAGNOSIS — Z992 Dependence on renal dialysis: Secondary | ICD-10-CM

## 2017-03-27 DIAGNOSIS — K55059 Acute (reversible) ischemia of intestine, part and extent unspecified: Secondary | ICD-10-CM

## 2017-03-27 DIAGNOSIS — E875 Hyperkalemia: Secondary | ICD-10-CM

## 2017-03-27 LAB — RENAL FUNCTION PANEL
Albumin: 2.9 g/dL — ABNORMAL LOW (ref 3.5–5.0)
Albumin: 2.7 g/dL — ABNORMAL LOW (ref 3.5–5.0)
Anion gap: 9 (ref 5–15)
Anion gap: 9 (ref 5–15)
BUN: 30 mg/dL — AB (ref 6–20)
BUN: 35 mg/dL — AB (ref 6–20)
CALCIUM: 7.9 mg/dL — AB (ref 8.9–10.3)
CALCIUM: 7.5 mg/dL — AB (ref 8.9–10.3)
CHLORIDE: 95 mmol/L — AB (ref 101–111)
CO2: 25 mmol/L (ref 22–32)
CO2: 27 mmol/L (ref 22–32)
CREATININE: 7.82 mg/dL — AB (ref 0.44–1.00)
CREATININE: 8.66 mg/dL — AB (ref 0.44–1.00)
Chloride: 92 mmol/L — ABNORMAL LOW (ref 101–111)
GFR calc non Af Amer: 4 mL/min — ABNORMAL LOW (ref >60)
GFR, EST AFRICAN AMERICAN: 5 mL/min — AB (ref >60)
GFR, EST AFRICAN AMERICAN: 5 mL/min — AB (ref >60)
GFR, EST NON AFRICAN AMERICAN: 4 mL/min — AB (ref >60)
GLUCOSE: 122 mg/dL — AB (ref 65–99)
Glucose, Bld: 48 mg/dL — ABNORMAL LOW (ref 65–99)
Phosphorus: 5.5 mg/dL — ABNORMAL HIGH (ref 2.5–4.6)
Phosphorus: 5.5 mg/dL — ABNORMAL HIGH (ref 2.5–4.6)
Potassium: 5.4 mmol/L — ABNORMAL HIGH (ref 3.5–5.1)
Potassium: 4.5 mmol/L (ref 3.5–5.1)
SODIUM: 129 mmol/L — AB (ref 135–145)
Sodium: 128 mmol/L — ABNORMAL LOW (ref 135–145)

## 2017-03-27 LAB — CBC
HCT: 33.5 % — ABNORMAL LOW (ref 36.0–46.0)
Hemoglobin: 10.8 g/dL — ABNORMAL LOW (ref 12.0–15.0)
MCH: 32 pg (ref 26.0–34.0)
MCHC: 32.2 g/dL (ref 30.0–36.0)
MCV: 99.1 fL (ref 78.0–100.0)
PLATELETS: 213 10*3/uL (ref 150–400)
RBC: 3.38 MIL/uL — AB (ref 3.87–5.11)
RDW: 16.5 % — AB (ref 11.5–15.5)
WBC: 4.7 10*3/uL (ref 4.0–10.5)

## 2017-03-27 LAB — GLUCOSE, CAPILLARY
GLUCOSE-CAPILLARY: 193 mg/dL — AB (ref 65–99)
GLUCOSE-CAPILLARY: 57 mg/dL — AB (ref 65–99)
GLUCOSE-CAPILLARY: 131 mg/dL — AB (ref 65–99)
Glucose-Capillary: 44 mg/dL — CL (ref 65–99)
Glucose-Capillary: 175 mg/dL — ABNORMAL HIGH (ref 65–99)

## 2017-03-27 MED ORDER — SODIUM CHLORIDE 0.9 % IV SOLN: 100.0000 mL | INTRAVENOUS | Status: DC | PRN

## 2017-03-27 MED ORDER — HEPARIN SODIUM (PORCINE) 5000 UNIT/ML IJ SOLN: 5000.0000 [IU] | Freq: Three times a day (TID) | INTRAMUSCULAR | Status: DC

## 2017-03-27 MED ORDER — DEXTROSE 50 % IV SOLN
INTRAVENOUS | Status: AC
  Administered 2017-03-27: 50 mL
  Filled 2017-03-27: qty 50

## 2017-03-27 MED ORDER — LIDOCAINE HCL (PF) 1 % IJ SOLN: 5.0000 mL | INTRAMUSCULAR | Status: DC | PRN

## 2017-03-27 MED ORDER — SODIUM CHLORIDE 0.9 % IV SOLN
500.0000 mg | INTRAVENOUS | Status: AC
  Administered 2017-03-27 – 2017-03-30 (×4): 0.5 g via INTRAVENOUS
  Filled 2017-03-27 (×8): qty 0.5

## 2017-03-27 MED ORDER — HEPARIN SODIUM (PORCINE) 1000 UNIT/ML DIALYSIS: 20.0000 [IU]/kg | INTRAMUSCULAR | Status: DC | PRN

## 2017-03-27 MED ORDER — DIPHENHYDRAMINE HCL 25 MG PO CAPS
50.0000 mg | ORAL_CAPSULE | Freq: Once | ORAL | Status: AC
  Administered 2017-03-28: 50 mg via ORAL
  Filled 2017-03-27: qty 2

## 2017-03-27 MED ORDER — DOXERCALCIFEROL 4 MCG/2ML IV SOLN
2.5000 ug | INTRAVENOUS | Status: DC
  Administered 2017-03-27 – 2017-03-31 (×2): 2.5 ug via INTRAVENOUS
  Filled 2017-03-27 (×5): qty 2

## 2017-03-27 MED ORDER — LIDOCAINE-PRILOCAINE 2.5-2.5 % EX CREA: 1.0000 "application " | TOPICAL_CREAM | CUTANEOUS | Status: DC | PRN

## 2017-03-27 MED ORDER — HEPARIN SODIUM (PORCINE) 5000 UNIT/ML IJ SOLN
5000.0000 [IU] | Freq: Three times a day (TID) | INTRAMUSCULAR | Status: DC
  Administered 2017-03-29 – 2017-04-02 (×9): 5000 [IU] via SUBCUTANEOUS
  Filled 2017-03-27 (×14): qty 1

## 2017-03-27 MED ORDER — PENTAFLUOROPROP-TETRAFLUOROETH EX AERO: 1.0000 "application " | INHALATION_SPRAY | CUTANEOUS | Status: DC | PRN

## 2017-03-27 MED ORDER — HEPARIN SODIUM (PORCINE) 1000 UNIT/ML DIALYSIS: 1000.0000 [IU] | INTRAMUSCULAR | Status: DC | PRN

## 2017-03-27 MED ORDER — DEXTROSE 50 % IV SOLN
INTRAVENOUS | Status: AC
  Administered 2017-03-27: 25 mL
  Filled 2017-03-27: qty 50

## 2017-03-27 MED ORDER — ALTEPLASE 2 MG IJ SOLR: 2.0000 mg | Freq: Once | INTRAMUSCULAR | Status: DC | PRN

## 2017-03-27 MED ORDER — RENA-VITE PO TABS
1.0000 | ORAL_TABLET | Freq: Every day | ORAL | Status: DC
Start: 2017-03-28 — End: 2017-04-05
  Administered 2017-03-29 – 2017-04-04 (×7): 1 via ORAL
  Filled 2017-03-27 (×8): qty 1

## 2017-03-27 MED ORDER — PREDNISONE 50 MG PO TABS
50.0000 mg | ORAL_TABLET | Freq: Four times a day (QID) | ORAL | Status: AC
  Administered 2017-03-27 – 2017-03-28 (×3): 50 mg via ORAL
  Filled 2017-03-27 (×3): qty 1

## 2017-03-27 MED ORDER — DOXERCALCIFEROL 4 MCG/2ML IV SOLN
INTRAVENOUS | Status: AC
  Filled 2017-03-27: qty 2

## 2017-03-27 MED ORDER — DEXTROSE 5 % IV SOLN
INTRAVENOUS | Status: DC
  Administered 2017-03-27: 18:00:00 via INTRAVENOUS

## 2017-03-27 NOTE — Progress Notes (Signed)
PROGRESS NOTE   Shawna [name]  ELF:[id number]    DOB: 07-09-1941    DOA: 03/22/2017  PCP: [name], MD   I have briefly reviewed patients previous medical records in [facility].  Brief Narrative:  76 year old married female, PMH of ESRD on MWF HD (Dr. Lowanda Foster, Nephrology), CAD/NSTEMI/PCI, chronic diastolic CHF (Dr. Domenic Polite, Cardiology), HTN, HLD, iron deficiency anemia, GI bleed due to small bowel angiodysplasia, initially admitted to [facility] on 03/22/17 for acute onset of lower abdominal pain and diarrhea. CT revealed focal enteritis-diverticulitis and question of contained perforation. GI and general surgery consulted and initially managed conservatively for presumed enteritis with bowel rest and IV antibiotics. Due to ongoing postprandial abdominal pain, suspected bowel ischemia and underwent an abdominal Doppler which showed more than 70% stenosis of all 3 mesenteric vessels at their origins. GI/Gen. surgery discussed with IR at [facility] and patient was transferred over on 9/16 for for possible stenting of mesenteric vessels. IR and nephrology consulted at [facility].   Assessment & Plan:   Active Problems:   SBO (small bowel obstruction) (HCC)   Enteritis   Diverticulitis   Mesenteric ischemia ([place])   1. Abdominal pain suspected due to mesenteric ischemia/small bowel enteritis: History as outlined above. Initially managed conservatively for possible enteritis/diverticulitis with bowel rest and IV ertapenem. Due to ongoing postprandial abdominal pain, underwent abdominal Doppler ultrasound (unable to get CTA abdomen due to no IV access for dye administration) which showed more than 70% stenosis at all 3 mesenteric vessel origins. Transferred from [facility] on 9/16 for evaluation by interventional radiology for possible intervention/stenting. I discussed with IR team this morning. I discussed with GI at [facility] who  recommended 7 days of antibiotics. 2. ESRD on MWF HD: Nephrology was consulting for dialysis needs at [facility]. I have consulted nephrologist at [facility] for dialysis needs. 3. Hyperkalemia: Secondary to ESRD in dialysis patient. Nephrology consulted for dialysis needs. 4. Hyponatremia: K 5.4 on 9/17. Likely related to ESRD and management per nephrology. 5. Secondary hyperparathyroidism: Per nephrology. 6. Left upper extremity pain: Mostly in the hand. Left upper extremity Dopplers notable for probable chronic thrombosis in the cephalic veins in the patient's dialysis fistula in the left arm, remaining veins are widely patent, dialysis fistula is widely patent. Resolved  7. Essential hypertension: Controlled. Continue carvedilol, Imdur and when necessary hydralazine. 8. Anemia of chronic disease and ESRD: Stable. Follow periodically across dialysis. 9. CAD/prior NSTEMI: No chest pain reported. Continue aspirin, carvedilol, Imdur and Brillinta. CT abdomen and pelvis 9/12 showed severe coronary artery calcification. 10. Renal mass: CT abdomen 9/12 showed stable right kidney upper pole 2.9 cm indeterminate mass and left kidney interpolar 1.6 cm indeterminate mass. Renal protocol CT with and without contrast was recommended to further characterize, defer to nephrology. 11. Hypoglycemia: Blood glucose 48 mg per DL noted on a.m. labs 9/17. Treat per hypoglycemia protocol. 12. ? History of A. fib: Noted in outpatient cardiologist progress note date/23/18. Remains on amiodarone, carvedilol, ASA and Brillinta. 13. Carotid artery disease: 14. Hyperlipidemia   DVT prophylaxis: Subcutaneous heparin  Code Status: Full  Family Communication: Discussed in detail with patient's spouse at bedside.  Disposition: DC home when medically improved.   Consultants:  Nephrology GI at [place] [facility] at [place] radiology   Procedures:  Hemodialysis   Antimicrobials:  IV Ertapenem     Subjective: Seen this morning. Reports feeling anxious about upcoming procedure. Denies  abdominal pain, nausea or vomiting. Reports itching after IV contrast (I have discussed this with IR who will premedicate prior to CTA abdomen).  ROS: No chest pain, dyspnea or palpitations.   Objective:  Vitals:   03/26/17 1758 03/26/17 2138 03/27/17 0505 03/27/17 0800  BP: (!) 149/52 (!) 123/53 (!) 155/54 (!) 151/48  Pulse: 65 67 66 64  Resp: 18 18 18    Temp: 97.9 F (36.6 C) 98.3 F (36.8 C) 98.2 F (36.8 C) 97.6 F (36.4 C)  TempSrc: Oral Oral Oral Oral  SpO2: 94% 100% 97% 96%  Weight:  57.2 kg (126 lb 1.6 oz)    Height:        Examination:  General exam: Pleasant elderly female, moderately built and nourished, lying comfortably supine in bed.  Respiratory system: Clear to auscultation. Respiratory effort normal. Cardiovascular system: S1 & S2 heard, RRR. No JVD, murmurs, rubs, gallops or clicks. No pedal edema.Telemetry: Sinus rhythm,? BBB morphology.  Gastrointestinal system: Abdomen is nondistended, soft and nontender. No organomegaly or masses felt. Normal bowel sounds heard. Central nervous system: Alert and oriented. No focal neurological deficits. Extremities: Symmetric 5 x 5 power. Skin: No rashes, lesions or ulcers Psychiatry: Judgement and insight appear normal. Mood & affect: Slightly anxious.     Data Reviewed: I have personally reviewed following labs and imaging studies  CBC:  Recent Labs Lab 03/22/17 0602 03/23/17 0519 03/24/17 0518 03/25/17 0613  WBC 8.6 5.3 6.1 6.4  NEUTROABS 7.2  --   --   --   HGB 13.5 10.8* 11.9* 11.3*  HCT 42.8 33.9* 36.8 34.3*  MCV 105.2* 105.0* 103.1* 101.2*  PLT 260 201 212 734   Basic Metabolic Panel:  Recent Labs Lab 03/23/17 0519 03/24/17 0518 03/25/17 0613 03/26/17 0801 03/27/17 0241  NA 136 133* 131* 132* 129*  K 4.1 4.6 4.9 4.5 5.4*  CL 97* 95* 94* 92* 95*  CO2 30 26 27 27 25   GLUCOSE 71 73 103* 66 48*  BUN  22* 36* 22* 23* 30*  CREATININE 5.84* 8.15* 6.08* 5.89* 7.82*  CALCIUM 7.9* 7.9* 8.1* 8.5* 7.9*  PHOS  --  5.2*  --  5.4* 5.5*   Liver Function Tests:  Recent Labs Lab 03/22/17 0602 03/24/17 0518 03/26/17 0801 03/27/17 0241  AST 19  --   --   --   ALT 14  --   --   --   ALKPHOS 104  --   --   --   BILITOT 0.5  --   --   --   PROT 7.4  --   --   --   ALBUMIN 4.0 3.1* 3.0* 2.9*   Coagulation Profile: No results for input(s): INR, PROTIME in the last 168 hours. Cardiac Enzymes: No results for input(s): CKTOTAL, CKMB, CKMBINDEX, TROPONINI in the last 168 hours. HbA1C: No results for input(s): HGBA1C in the last 72 hours. CBG:  Recent Labs Lab 03/27/17 0822 03/27/17 0854 03/27/17 1059  GLUCAP 44* 193* 57*    Recent Results (from the past 240 hour(s))  MRSA PCR Screening     Status: None   Collection Time: 03/22/17  8:51 AM  Result Value Ref Range Status   MRSA by PCR NEGATIVE NEGATIVE Final    Comment:        The GeneXpert MRSA Assay (FDA approved for NASAL specimens only), is one component of a comprehensive MRSA colonization surveillance program. It is not intended to diagnose MRSA infection nor to guide or monitor treatment  for MRSA infections.          Radiology Studies: US Abdominal Pelvic Art/vent Flow Doppler  Result Date: 03/26/2017 CLINICAL DATA:  76 year old female with possible mesenteric ischemia. End-stage renal disease Cardiovascular risk factors include hypertension, known prior coronary artery disease, known prior vascular disease with vascular surgery. EXAM: [place] MESENTERIC ARTERIAL DOPPLER COMPARISON:  Noncontrast CT 03/22/2017, 11/18/2016 FINDINGS: Celiac axis: 262 cm/sec Celiac axis with inspiration: 220 cm/sec Celiac axis with expiration: 262 cm/sec Splenic artery: Splenic artery not visualize Hepatic artery: Hepatic artery not visualized SMA: 433 cm/sec IMA: 375 cm/sec IMPRESSION: Elevated velocities at the origin of the celiac artery,  superior mesenteric artery, inferior mesenteric artery, compatible with greater than 70% stenosis at all 3 mesenteric vessels origin. Signed, Dulcy Fanny. Earleen Newport, DO [facility] Radiology Electronically Signed   By: Corrie Mckusick D.O.   On: 03/26/2017 10:46        Scheduled Meds: . amiodarone  200 mg Oral Daily  . aspirin EC  81 mg Oral Daily  . bisacodyl  10 mg Rectal q12n4p  . carvedilol  6.25 mg Oral BID WC  . cinacalcet  30 mg Oral QPC supper  . doxercalciferol  2.5 mcg Intravenous Q M,W,F-HD  . famotidine  20 mg Oral QODAY  . [START ON 03/28/2017] heparin  5,000 Units Subcutaneous Q8H  . isosorbide mononitrate  120 mg Oral Daily  . multivitamin  1 tablet Oral Daily  . sevelamer carbonate  800 mg Oral TID WC  . ticagrelor  90 mg Oral BID   Continuous Infusions: . sodium chloride    . sodium chloride    . sodium chloride    . sodium chloride    . ertapenem    . ertapenem Stopped (03/26/17 1240)     LOS: 5 days     Amarah Brossman, MD, FACP, FHM. [facility] Pager [phone]  If 7PM-7AM, please contact night-coverage [email or web address] Password [email or web address] 03/27/2017, 11:25 AM

## 2017-03-27 NOTE — Consult Note (Signed)
[facility] Renal Consultation Note    Indication for Consultation:  Management of ESRD/hemodialysis; anemia, hypertension/volume and secondary hyperparathyroidism PCP: [name], MD   HPI: Shawna [name] is a 76 y.o. femalewith ESRD, CAD, HTN, CHF, hx GIB secondary to AVMs who was admitted to [facility] 9/12 and then transferred to [facility] earlier today. She was admitted to [facility] with worsening abdominal pain and diarrhea prior to that. Labs were not remarkable but CT abdomen and plevis showed questionable perforation vs small bowel diverticultitis.  She was started on IV antibiotics and admitted.  She was dialyzed 9/14 and 9/15 and seen by her primary nephrologist Dr. Hinda Lenis while at [facility].  She transferred to [facility] today for IR evaluation and treatment due to concern for mesenteric ischemia due to postprandial abdominal pain.  An abdominal doppler showed > 70% stenosis of all three mesenteric vessels at their origins.  She hasn't had much to eat except some broth and coffee last night. She doesn't make urine. Today she denies any pain anywhere except when I pressed on her belly. No SOB, no cough, fever or chills.  She said the dialysis unit was going to use her new access next week. Appetite is good and she is hungry now.  Past Medical History:  Diagnosis Date  . Acute on chronic respiratory failure with hypoxia ([place]) 10/10/2016  . Anxiety   . Arthritis   . AVM (arteriovenous malformation) of colon   . CAD (coronary artery disease)    a. 12/2011 NSTEMI/Cath/PCI LCX (2.25x14 Resolute DES) & D1 (2.25x22 Resolute DES);  b. 01/2012 Cath/PCI: LM 30, LAD 30p, 40-73m, D1 stent ok, 99 in sm branch of diag, LCX patent stent, OM1 20, RCA 95 ost (4.0x12 Promus DES), EF 55%;  c. 04/2012 Lexi Cardiolite  EF 48%, small area of scar @ base/mid inflat wall with mild peri-infarct ischemia.; CABG 12/4  . Carotid artery disease ([place])    a. 55-73% LICA, 08/2023   . Chronic  bronchitis ([place])   . Chronic diastolic CHF (congestive heart failure) ([place])    a. 02/2012 Echo EF 60-65%, nl wall motion, Gr 1 DD, mod MR  . Colon cancer ([place]) 1992  . Esophageal stricture   . ESRD on hemodialysis ([place])    ESRD due to HTN, started dialysis 2011 and gets HD at [facility] with Dr Hinda Lenis on MWF schedule.  Access is LUA AVF as of Sept 2014.   Marland Kitchen GERD (gastroesophageal reflux disease)   . High cholesterol 12/2011  . History of blood transfusion 07/2011; 12/2011; 01/2012 X 2; 04/2012  . History of gout   . History of lower GI bleeding   . Hypertension   . Iron deficiency anemia   . Mitral regurgitation    a. Moderate by echo, 02/2012  . Myocardial infarction ([place])   . Ovarian cancer ([place]) 1992  . Pneumonia ~ 2009  . PUD (peptic ulcer disease)   . TIA (transient ischemic attack)    Past Surgical History:  Procedure Laterality Date  . ABDOMINAL HYSTERECTOMY  1992  . APPENDECTOMY  06/1990  . AV FISTULA PLACEMENT  07/2009   left upper arm  . AV FISTULA PLACEMENT Right 09/06/2016   Procedure: RIGHT FOREARM ARTERIOVENOUS (AV) GRAFT;  Surgeon: Elam Dutch, MD;  Location: [facility] OR;  Service: Vascular;  Laterality: Right;  . AV FISTULA PLACEMENT N/A 02/24/2017   Procedure: INSERTION OF ARTERIOVENOUS (AV) GORE-TEX GRAFT ARM (BRACHIAL AXILLARY);  Surgeon: Katha Cabal,  MD;  Location: [facility] ORS;  Service: Vascular;  Laterality: N/A;  . [place] Right 09/06/2016   Procedure: REMOVAL OF Right Arm ARTERIOVENOUS GORETEX GRAFT and Vein Patch angioplasty of brachial artery;  Surgeon: Angelia Mould, MD;  Location: [place];  Service: Vascular;  Laterality: Right;  . COLON RESECTION  1992  . COLON SURGERY    . CORONARY ANGIOPLASTY WITH STENT PLACEMENT  12/15/11   "2"  . CORONARY ANGIOPLASTY WITH STENT PLACEMENT  y/2013   "1; makes total of 3" (05/02/2012)  . CORONARY ARTERY BYPASS GRAFT  06/13/2012   Procedure: CORONARY ARTERY BYPASS GRAFTING (CABG);  Surgeon: Grace Isaac,  MD;  Location: [place];  Service: Open Heart Surgery;  Laterality: N/A;  cabg x four;  using left internal mammary artery, and left leg greater saphenous vein harvested endoscopically  . CORONARY STENT INTERVENTION N/A 10/13/2016   Procedure: Coronary Stent Intervention;  Surgeon: Troy Sine, MD;  Location: [place] CV LAB;  Service: Cardiovascular;  Laterality: N/A;  . DILATION AND CURETTAGE OF UTERUS    . ESOPHAGOGASTRODUODENOSCOPY  01/20/2012   Procedure: ESOPHAGOGASTRODUODENOSCOPY (EGD);  Surgeon: Ladene Artist, MD,FACG;  Location: [facility] ENDOSCOPY;  Service: Endoscopy;  Laterality: N/A;  . ESOPHAGOGASTRODUODENOSCOPY N/A 03/26/2013   Procedure: ESOPHAGOGASTRODUODENOSCOPY (EGD);  Surgeon: Irene Shipper, MD;  Location: [facility] ENDOSCOPY;  Service: Endoscopy;  Laterality: N/A;  . ESOPHAGOGASTRODUODENOSCOPY N/A 04/30/2015   Procedure: ESOPHAGOGASTRODUODENOSCOPY (EGD);  Surgeon: Rogene Houston, MD;  [facility];  Service: Endoscopy;  Laterality: N/A;  1pm - moved to 10/20 @ 1:10  . ESOPHAGOGASTRODUODENOSCOPY N/A 07/29/2016   Procedure: ESOPHAGOGASTRODUODENOSCOPY (EGD);  Surgeon: Manus Gunning, MD;  Location: [place];  Service: Gastroenterology;  Laterality: N/A;  enteroscopy  . INTRAOPERATIVE TRANSESOPHAGEAL ECHOCARDIOGRAM  06/13/2012   Procedure: INTRAOPERATIVE TRANSESOPHAGEAL ECHOCARDIOGRAM;  Surgeon: Grace Isaac, MD;  Location: [place];  Service: Open Heart Surgery;  Laterality: N/A;  . IR GENERIC HISTORICAL  07/26/2016   IR FLUORO GUIDE CV LINE RIGHT 07/26/2016 Greggory Keen, MD [facility]  . IR GENERIC HISTORICAL  07/26/2016   IR US GUIDE VASC ACCESS RIGHT 07/26/2016 Greggory Keen, MD [facility]  . IR GENERIC HISTORICAL  08/02/2016   IR US GUIDE VASC ACCESS RIGHT 08/02/2016 Greggory Keen, MD [facility]  . IR GENERIC HISTORICAL  08/02/2016   IR FLUORO GUIDE CV LINE RIGHT 08/02/2016 Greggory Keen, MD [facility]  . LEFT HEART CATH AND CORONARY ANGIOGRAPHY N/A  09/20/2016   Procedure: Left Heart Cath and Coronary Angiography;  Surgeon: [name], MD;  Location: [place] CV LAB;  Service: Cardiovascular;  Laterality: N/A;  . LEFT HEART CATH AND CORS/GRAFTS ANGIOGRAPHY N/A 10/13/2016   Procedure: Left Heart Cath and Cors/Grafts Angiography;  Surgeon: Troy Sine, MD;  Location: [place] CV LAB;  Service: Cardiovascular;  Laterality: N/A;  . LEFT HEART CATHETERIZATION WITH CORONARY ANGIOGRAM N/A 12/15/2011   Procedure: LEFT HEART CATHETERIZATION WITH CORONARY ANGIOGRAM;  Surgeon: Burnell Blanks, MD;  Location: [facility] CATH LAB;  Service: Cardiovascular;  Laterality: N/A;  . LEFT HEART CATHETERIZATION WITH CORONARY ANGIOGRAM N/A 01/10/2012   Procedure: LEFT HEART CATHETERIZATION WITH CORONARY ANGIOGRAM;  Surgeon: Peter M Martinique, MD;  Location: [facility] CATH LAB;  Service: Cardiovascular;  Laterality: N/A;  . LEFT HEART CATHETERIZATION WITH CORONARY ANGIOGRAM N/A 06/08/2012   Procedure: LEFT HEART CATHETERIZATION WITH CORONARY ANGIOGRAM;  Surgeon: Burnell Blanks, MD;  Location: [facility] CATH LAB;  Service: Cardiovascular;  Laterality: N/A;  . LEFT  HEART CATHETERIZATION WITH CORONARY/GRAFT ANGIOGRAM N/A 12/10/2013   Procedure: LEFT HEART CATHETERIZATION WITH Beatrix Fetters;  Surgeon: [name], MD;  Location: [facility] CATH LAB;  Service: Cardiovascular;  Laterality: N/A;  . OVARY SURGERY     ovarian cancer  . REVISION OF ARTERIOVENOUS GORETEX GRAFT N/A 02/24/2017   Procedure: REVISION OF ARTERIOVENOUS GORETEX GRAFT (RESECTION);  Surgeon: Katha Cabal, MD;  Location: [facility] ORS;  Service: Vascular;  Laterality: N/A;  [place] N/A 10/15/2013   Procedure: Fistulogram;  Surgeon: Serafina Mitchell, MD;  Location: [facility] CATH LAB;  Service: Cardiovascular;  Laterality: N/A;  . THROMBECTOMY / ARTERIOVENOUS GRAFT REVISION  2011   left upper arm  . TUBAL LIGATION  1980's  . UPPER EXTREMITY ANGIOGRAPHY Bilateral 12/06/2016   Procedure: Upper Extremity  Angiography;  Surgeon: Katha Cabal, MD;  Location: [place] CV LAB;  Service: Cardiovascular;  Laterality: Bilateral;   Family History  Problem Relation Age of Onset  . Heart disease Mother        Heart Disease before age 77  . Hyperlipidemia Mother   . Hypertension Mother   . Diabetes Mother   . Heart attack Mother   . Heart disease Father        Heart Disease before age 69  . Hyperlipidemia Father   . Hypertension Father   . Diabetes Father   . Diabetes Sister   . Hypertension Sister   . Diabetes Brother   . Hyperlipidemia Brother   . Heart attack Brother   . Hypertension Sister   . Heart attack Brother   . Other Unknown        noncontributory for early CAD  . Colon cancer Neg Hx   . Esophageal cancer Neg Hx   . Liver disease Neg Hx   . Kidney disease Neg Hx   . Colon polyps Neg Hx    Social History:  reports that she has never smoked. She has never used smokeless tobacco. She reports that she does not drink alcohol or use drugs. Allergies  Allergen Reactions  . Aspirin Other (See Comments)    High Doses Mess up her stomach; "makes my bowels have blood in them". Takes 81 mg EC Aspirin   . Penicillins Other (See Comments)    SYNCOPE? , "makes me real weak when I take it; like I'll pass out"  Has patient had a PCN reaction causing immediate rash, facial/tongue/throat swelling, SOB or lightheadedness with hypotension: Yes Has patient had a PCN reaction causing severe rash involving mucus membranes or skin necrosis: no Has patient had a PCN reaction that required hospitalization no Has patient had a PCN reaction occurring within the last 10 years: no If all of the above  . Amlodipine Swelling  . Bactrim [Sulfamethoxazole-Trimethoprim] Rash  . Contrast Media [Iodinated Diagnostic Agents] Itching  . Iron Itching and Other (See Comments)    "they gave me iron in dialysis; had to give me Benadryl cause I had to have the iron" (05/02/2012)  . Nitrofurantoin  Hives  . Tylenol [Acetaminophen] Itching and Other (See Comments)    Makes her feet on fire per pt  . Gabapentin Other (See Comments)    unspecified  . Dexilant [Dexlansoprazole] Other (See Comments)    Upset stomach  . Levaquin [Levofloxacin In D5w] Rash  . Morphine And Related Itching    Itching in feet  . Plavix [Clopidogrel Bisulfate] Rash  . Protonix [Pantoprazole Sodium] Rash  . Venofer [Ferric Oxide] Itching  Patient reports using Benadryl prior to doses as [facility]   Prior to Admission medications   Medication Sig Start Date End Date Taking? Authorizing Provider  ALPRAZolam (XANAX) 0.25 MG tablet Take 0.25 mg by mouth 2 (two) times daily as needed for anxiety or sleep.  11/19/15  Yes [provider]  amiodarone (PACERONE) 200 MG tablet Take 1 tablet (200 mg total) by mouth daily. 12/08/16  Yes Herminio Commons, MD  aspirin EC 81 MG tablet Take 81 mg by mouth daily.    Yes [provider]  carvedilol (COREG) 6.25 MG tablet Take 1 tablet (6.25 mg total) by mouth 2 (two) times daily with a meal. 11/16/16  Yes Herminio Commons, MD  cinacalcet (SENSIPAR) 30 MG tablet Take 30 mg by mouth daily after supper.   Yes [provider]  cloNIDine (CATAPRES) 0.2 MG tablet Take 1 tablet (0.2 mg total) by mouth 3 (three) times daily. Patient taking differently: Take 0.2 mg by mouth 2 (two) times daily as needed (high blood pressure).  12/08/16  Yes Herminio Commons, MD  diphenhydrAMINE (BENADRYL) 25 mg capsule Take 50 mg by mouth daily as needed for itching or allergies.    Yes [provider]  epoetin alfa (EPOGEN,PROCRIT) 78295 UNIT/ML injection Inject 1 mL (10,000 Units total) into the vein every Monday, Wednesday, and Friday with hemodialysis. 04/11/16  Yes Orvan Falconer, MD  fluticasone (FLONASE) 50 MCG/ACT nasal spray Place 2 sprays into the nose at bedtime as needed for allergies.    Yes [provider]  isosorbide mononitrate (IMDUR)  120 MG 24 hr tablet Take 1 tablet (120 mg total) by mouth daily. 01/21/17  Yes Caren Griffins, MD  multivitamin (RENA-VIT) TABS tablet Take 1 tablet by mouth daily.   Yes [provider]  nitroGLYCERIN (NITROSTAT) 0.4 MG SL tablet Place 1 tablet (0.4 mg total) under the tongue every 5 (five) minutes as needed for chest pain. 08/02/16  Yes Buriev, Arie Sabina, MD  omeprazole (PRILOSEC) 20 MG capsule Take 20 mg by mouth daily.  09/12/16  Yes [provider]  ondansetron (ZOFRAN-ODT) 4 MG disintegrating tablet Take 4 mg by mouth 2 (two) times daily as needed for nausea or vomiting.   Yes [provider]  oxycodone (OXY-IR) 5 MG capsule Take 1 capsule (5 mg total) by mouth every 6 (six) hours as needed for pain. 02/24/17  Yes Schnier, Dolores Lory, MD  sevelamer carbonate (RENVELA) 800 MG tablet Take 800 mg by mouth 3 (three) times daily with meals.   Yes [provider]  simvastatin (ZOCOR) 20 MG tablet TAKE ONE TABLET BY MOUTH AT BEDTIME 02/13/17  Yes Herminio Commons, MD  ticagrelor (BRILINTA) 90 MG TABS tablet Take 90 mg by mouth 2 (two) times daily.   Yes [provider]  hydrALAZINE (APRESOLINE) 50 MG tablet Take 2 tablets (100 mg total) by mouth 3 (three) times daily. Patient taking differently: Take 100 mg by mouth 2 (two) times daily as needed (for high blood pressure readings).  11/10/16 02/20/17  Herminio Commons, MD   Current Facility-Administered Medications  Medication Dose Route Frequency Provider Last Rate Last Dose  . 0.9 %  sodium chloride infusion  100 mL Intravenous PRN Bhutani, Manpreet S, MD      . 0.9 %  sodium chloride infusion  100 mL Intravenous PRN Bhutani, Manpreet S, MD      . 0.9 %  sodium chloride infusion  100 mL Intravenous PRN  Fran Lowes, MD      . 0.9 %  sodium chloride infusion  100 mL Intravenous PRN Fran Lowes, MD      . ALPRAZolam Duanne Moron) tablet 0.25 mg  0.25 mg Oral BID PRN Theodis Blaze, MD      .  amiodarone (PACERONE) tablet 200 mg  200 mg Oral Daily Theodis Blaze, MD   200 mg at 03/27/17 1103  . aspirin EC tablet 81 mg  81 mg Oral Daily Theodis Blaze, MD   81 mg at 03/27/17 1102  . bisacodyl (DULCOLAX) suppository 10 mg  10 mg Rectal Daily PRN Theodis Blaze, MD   10 mg at 03/23/17 1035  . bisacodyl (DULCOLAX) suppository 10 mg  10 mg Rectal q12n4p Aviva Signs, MD   10 mg at 03/26/17 1722  . camphor-menthol (SARNA) lotion   Topical PRN Theodis Blaze, MD      . carvedilol (COREG) tablet 6.25 mg  6.25 mg Oral BID WC Theodis Blaze, MD   6.25 mg at 03/27/17 1106  . cinacalcet (SENSIPAR) tablet 30 mg  30 mg Oral QPC supper Theodis Blaze, MD   30 mg at 03/24/17 1903  . dextrose 5 % solution   Intravenous Continuous Hongalgi, Anand D, MD      . diphenhydrAMINE (BENADRYL) capsule 25 mg  25 mg Oral Q6H PRN Theodis Blaze, MD   25 mg at 03/26/17 0201  . [START ON 03/28/2017] diphenhydrAMINE (BENADRYL) capsule 50 mg  50 mg Oral Once Turpin, Pamela, PA-C      . doxercalciferol (HECTOROL) injection 2.5 mcg  2.5 mcg Intravenous Q M,W,F-HD Alric Seton, PA-C      . ertapenem [facility]) 0.5 g in sodium chloride 0.9 % 50 mL IVPB  500 mg Intravenous Q24H Hongalgi, Anand D, MD      . famotidine (PEPCID) tablet 20 mg  20 mg Oral Angelique Holm, MD      . fluticasone [facility]) 50 MCG/ACT nasal spray 2 spray  2 spray Each Nare QHS PRN Theodis Blaze, MD      . Derrill Memo ON 03/28/2017] heparin injection 5,000 Units  5,000 Units Subcutaneous Q8H Monia Sabal, PA-C      . hydrALAZINE (APRESOLINE) injection 5 mg  5 mg Intravenous Q4H PRN Theodis Blaze, MD   5 mg at 03/22/17 1635  . isosorbide mononitrate (IMDUR) 24 hr tablet 120 mg  120 mg Oral Daily Theodis Blaze, MD   120 mg at 03/26/17 1152  . multivitamin (RENA-VIT) tablet 1 tablet  1 tablet Oral Daily Theodis Blaze, MD   1 tablet at 03/27/17 1102  . nitroGLYCERIN (NITROSTAT) SL tablet 0.4 mg  0.4 mg Sublingual Q5 min PRN Theodis Blaze, MD       . ondansetron [facility]) injection 4 mg  4 mg Intravenous Q6H PRN Theodis Blaze, MD      . oxyCODONE (Oxy IR/ROXICODONE) immediate release tablet 5 mg  5 mg Oral Q6H PRN Theodis Blaze, MD   5 mg at 03/25/17 1814  . predniSONE (DELTASONE) tablet 50 mg  50 mg Oral Q6H Monia Sabal, PA-C      . sevelamer carbonate (RENVELA) tablet 800 mg  800 mg Oral TID WC Theodis Blaze, MD   800 mg at 03/25/17 0818  . ticagrelor (BRILINTA) tablet 90 mg  90 mg Oral BID Theodis Blaze, MD   90 mg at 03/27/17 1102   Labs: Basic Metabolic  Panel:  Recent Labs Lab 03/24/17 0518 03/25/17 0613 03/26/17 0801 03/27/17 0241  NA 133* 131* 132* 129*  K 4.6 4.9 4.5 5.4*  CL 95* 94* 92* 95*  CO2 26 27 27 25   GLUCOSE 73 103* 66 48*  BUN 36* 22* 23* 30*  CREATININE 8.15* 6.08* 5.89* 7.82*  CALCIUM 7.9* 8.1* 8.5* 7.9*  PHOS 5.2*  --  5.4* 5.5*   Liver Function Tests:  Recent Labs Lab 03/22/17 0602 03/24/17 0518 03/26/17 0801 03/27/17 0241  AST 19  --   --   --   ALT 14  --   --   --   ALKPHOS 104  --   --   --   BILITOT 0.5  --   --   --   PROT 7.4  --   --   --   ALBUMIN 4.0 3.1* 3.0* 2.9*    Recent Labs Lab 03/22/17 0602  LIPASE 31   No results for input(s): AMMONIA in the last 168 hours. CBC:  Recent Labs Lab 03/22/17 0602 03/23/17 0519 03/24/17 0518 03/25/17 0613  WBC 8.6 5.3 6.1 6.4  NEUTROABS 7.2  --   --   --   HGB 13.5 10.8* 11.9* 11.3*  HCT 42.8 33.9* 36.8 34.3*  MCV 105.2* 105.0* 103.1* 101.2*  PLT 260 201 212 237   Cardiac Enzymes: No results for input(s): CKTOTAL, CKMB, CKMBINDEX, TROPONINI in the last 168 hours. CBG:  Recent Labs Lab 03/27/17 0822 03/27/17 0854 03/27/17 1059 03/27/17 1143  GLUCAP 44* 193* 57* 175*   Iron Studies: No results for input(s): IRON, TIBC, TRANSFERRIN, FERRITIN in the last 72 hours. Studies/Results: US Abdominal Pelvic Art/vent Flow Doppler  Result Date: 03/26/2017 CLINICAL DATA:  76 year old female with possible mesenteric  ischemia. End-stage renal disease Cardiovascular risk factors include hypertension, known prior coronary artery disease, known prior vascular disease with vascular surgery. EXAM: [place] MESENTERIC ARTERIAL DOPPLER COMPARISON:  Noncontrast CT 03/22/2017, 11/18/2016 FINDINGS: Celiac axis: 262 cm/sec Celiac axis with inspiration: 220 cm/sec Celiac axis with expiration: 262 cm/sec Splenic artery: Splenic artery not visualize Hepatic artery: Hepatic artery not visualized SMA: 433 cm/sec IMA: 375 cm/sec IMPRESSION: Elevated velocities at the origin of the celiac artery, superior mesenteric artery, inferior mesenteric artery, compatible with greater than 70% stenosis at all 3 mesenteric vessels origin. Signed, Dulcy Fanny. Earleen Newport, DO [facility] Radiology Electronically Signed   By: Corrie Mckusick D.O.   On: 03/26/2017 10:46    ROS: As per HPI otherwise negative.     Physical Exam: Vitals:   03/26/17 1758 03/26/17 2138 03/27/17 0505 03/27/17 0800  BP: (!) 149/52 (!) 123/53 (!) 155/54 (!) 151/48  Pulse: 65 67 66 64  Resp: 18 18 18    Temp: 97.9 F (36.6 C) 98.3 F (36.8 C) 98.2 F (36.8 C) 97.6 F (36.4 C)  TempSrc: Oral Oral Oral Oral  SpO2: 94% 100% 97% 96%  Weight:  57.2 kg (126 lb 1.6 oz)    Height:         General: elderly lady NAD Head: NCAT sclera not icteric MMM Neck: Supple.  Lungs: CTA bilaterally without wheezes, rales, or rhonchi. Breathing is unlabored. Heart: RRR with S1 S2.  Abdomen: soft + L sided tenderness + BS Lower extremities:without edema or ischemic changes, no open wounds  Neuro: A & O  X 3. Moves all extremities spontaneously. Psych:  Responds to questions appropriately with a normal affect. Dialysis Access: right IJ TDC and left upper  AVGG + bruit  Dialysis Orders:  EDW 56.5. 3 hours 45 min profile 2 HD Bath 2K/2.5 calc, Heparin yes- 2000 bolus+ 200 per hour right .IJ TDCand left upper AVGG 02/24/17 - Dr. Ronalee Belts placed PC  350 BFR, 600 DFR- BP will drop fast according to nursing.  Epo 7800 per HD , hectorol 2.5 mcg q HD, venofer 50 , Epo 7800 , fosrenol 1000 TID for phos binding   Assessment/Plan: 1. Abdominal pain due to possible mesenteric ischemia with small bowel enterisits. - IR to evaluate - on a 7 day course of antibiotics 2. ESRD -  MWF- for HD today K 5.4 - use 2 K bath; left upper AVGG is patent and 29 weeks old - should be ok to with Wednesday dialysis. 3. Hypertension/volume  - keep systolic BP > 372 due to risk of mesenteric ischemia/volume status ok 4. Anemia  - hgb 11.3 - no ESA for now - on Epo 7800 TIW - continue weekly Fe 5. Metabolic bone disease -  Continue hectorol/sensipar - NPO so no binders until eating 6. Nutrition - c/o not having anything to eat for days; advance as able once testing complete 7. CAD/hx NSTEMI 8. Renal mass - 1.6 cm left kidney nodule new in 5/18- stable 02/2017, right kidney nodule 2.2 cm 2014 to 2.9 11/2016- stable 02/2017- she tells me she had a urology appointment for evaluation but missed it due to a hospitalization and the appointment was never rescheduled - this will need to be done after discharge.  Myriam Jacobson, PA-C [place] [phone] 03/27/2017, 12:05 PM

## 2017-03-27 NOTE — Care Management Important Message (Signed)
Important Message  Patient Details  Name: Shawna [name] MRN: [id number] Date of Birth: May 27, 1941   Medicare Important Message Given:  Yes    [name] May 03/27/2017, 10:33 AM

## 2017-03-27 NOTE — Plan of Care (Signed)
Problem: Nutrition: Goal: Adequate nutrition will be maintained Outcome: Progressing Blood sugars dropping. MD aware. Pt given D 50 twice this am. Pt alert and oriented x 4 but drowsy.

## 2017-03-27 NOTE — Consult Note (Signed)
Chief Complaint: Patient was seen in consultation today for possible mesenteric arteriogram with possible angioplasty/stent placement Chief Complaint  Patient presents with  . Abdominal Pain   at the request of Dr Mickeal Needy   Supervising Physician: Arne Cleveland  Patient Status: [facility] - In-pt  History of Present Illness: Shawna [name] is a 76 y.o. female   ESRD; CAD;stent CHF; HTN; IDA GI bleed due to angiodysplasia Acute lower abd pain--post prandial GI and Surgery initially treated pt with bowel rest and antibiotics Continued pain [place] Abd/pelvic vasc study: IMPRESSION: Elevated velocities at the origin of the celiac artery, superior mesenteric artery, inferior mesenteric artery, compatible with greater than 70% stenosis at all 3 mesenteric vessels origin.  Now for CTA abdomen per Rec per Dr Vernard Gambles Pt is with Dye allergy (illness and itching) Will pre medicate Pred 50 mg #3 and Benadryl 50 mg #1 ---(start at 4pm today---orders in place) Will check CTA in am May need arteriogram with possible angioplasty/styent placements Pt and husband aware     Past Medical History:  Diagnosis Date  . Acute on chronic respiratory failure with hypoxia ([place]) 10/10/2016  . Anxiety   . Arthritis   . AVM (arteriovenous malformation) of colon   . CAD (coronary artery disease)    a. 12/2011 NSTEMI/Cath/PCI LCX (2.25x14 Resolute DES) & D1 (2.25x22 Resolute DES);  b. 01/2012 Cath/PCI: LM 30, LAD 30p, 40-52m, D1 stent ok, 99 in sm branch of diag, LCX patent stent, OM1 20, RCA 95 ost (4.0x12 Promus DES), EF 55%;  c. 04/2012 Lexi Cardiolite  EF 48%, small area of scar @ base/mid inflat wall with mild peri-infarct ischemia.; CABG 12/4  . Carotid artery disease ([place])    a. 71-06% LICA, 08/6946   . Chronic bronchitis ([place])   . Chronic diastolic CHF (congestive heart failure) ([place])    a. 02/2012 Echo EF 60-65%, nl wall motion, Gr 1 DD, mod MR  . Colon cancer ([place]) 1992  . Esophageal stricture    . ESRD on hemodialysis ([place])    ESRD due to HTN, started dialysis 2011 and gets HD at [facility] with Dr Hinda Lenis on MWF schedule.  Access is LUA AVF as of Sept 2014.   Marland Kitchen GERD (gastroesophageal reflux disease)   . High cholesterol 12/2011  . History of blood transfusion 07/2011; 12/2011; 01/2012 X 2; 04/2012  . History of gout   . History of lower GI bleeding   . Hypertension   . Iron deficiency anemia   . Mitral regurgitation    a. Moderate by echo, 02/2012  . Myocardial infarction ([place])   . Ovarian cancer ([place]) 1992  . Pneumonia ~ 2009  . PUD (peptic ulcer disease)   . TIA (transient ischemic attack)     Past Surgical History:  Procedure Laterality Date  . ABDOMINAL HYSTERECTOMY  1992  . APPENDECTOMY  06/1990  . AV FISTULA PLACEMENT  07/2009   left upper arm  . AV FISTULA PLACEMENT Right 09/06/2016   Procedure: RIGHT FOREARM ARTERIOVENOUS (AV) GRAFT;  Surgeon: Elam Dutch, MD;  Location: [facility] OR;  Service: Vascular;  Laterality: Right;  . AV FISTULA PLACEMENT N/A 02/24/2017   Procedure: INSERTION OF ARTERIOVENOUS (AV) GORE-TEX GRAFT ARM (BRACHIAL AXILLARY);  Surgeon: Katha Cabal, MD;  Location: [facility] ORS;  Service: Vascular;  Laterality: N/A;  . [place] Right 09/06/2016   Procedure: REMOVAL OF Right Arm ARTERIOVENOUS GORETEX GRAFT and Vein Patch angioplasty of brachial artery;  Surgeon: Angelia Mould, MD;  Location: [facility] OR;  Service: Vascular;  Laterality: Right;  . COLON RESECTION  1992  . COLON SURGERY    . CORONARY ANGIOPLASTY WITH STENT PLACEMENT  12/15/11   "2"  . CORONARY ANGIOPLASTY WITH STENT PLACEMENT  y/2013   "1; makes total of 3" (05/02/2012)  . CORONARY ARTERY BYPASS GRAFT  06/13/2012   Procedure: CORONARY ARTERY BYPASS GRAFTING (CABG);  Surgeon: Grace Isaac, MD;  Location: [place];  Service: Open Heart Surgery;  Laterality: N/A;  cabg x four;  using left internal mammary artery, and left leg greater saphenous vein harvested endoscopically  .  CORONARY STENT INTERVENTION N/A 10/13/2016   Procedure: Coronary Stent Intervention;  Surgeon: Troy Sine, MD;  Location: [place] CV LAB;  Service: Cardiovascular;  Laterality: N/A;  . DILATION AND CURETTAGE OF UTERUS    . ESOPHAGOGASTRODUODENOSCOPY  01/20/2012   Procedure: ESOPHAGOGASTRODUODENOSCOPY (EGD);  Surgeon: Ladene Artist, MD,FACG;  Location: [facility] ENDOSCOPY;  Service: Endoscopy;  Laterality: N/A;  . ESOPHAGOGASTRODUODENOSCOPY N/A 03/26/2013   Procedure: ESOPHAGOGASTRODUODENOSCOPY (EGD);  Surgeon: Irene Shipper, MD;  Location: [facility] ENDOSCOPY;  Service: Endoscopy;  Laterality: N/A;  . ESOPHAGOGASTRODUODENOSCOPY N/A 04/30/2015   Procedure: ESOPHAGOGASTRODUODENOSCOPY (EGD);  Surgeon: Rogene Houston, MD;  Location: [facility] ENDO [facility];  Service: Endoscopy;  Laterality: N/A;  1pm - moved to 10/20 @ 1:10  . ESOPHAGOGASTRODUODENOSCOPY N/A 07/29/2016   Procedure: ESOPHAGOGASTRODUODENOSCOPY (EGD);  Surgeon: Manus Gunning, MD;  Location: [place];  Service: Gastroenterology;  Laterality: N/A;  enteroscopy  . INTRAOPERATIVE TRANSESOPHAGEAL ECHOCARDIOGRAM  06/13/2012   Procedure: INTRAOPERATIVE TRANSESOPHAGEAL ECHOCARDIOGRAM;  Surgeon: Grace Isaac, MD;  Location: [name];  Service: Open Heart Surgery;  Laterality: N/A;  . IR GENERIC HISTORICAL  07/26/2016   IR FLUORO GUIDE CV LINE RIGHT 07/26/2016 Greggory Keen, MD [facility]  . IR GENERIC HISTORICAL  07/26/2016   IR US GUIDE VASC ACCESS RIGHT 07/26/2016 Greggory Keen, MD [facility]  . IR GENERIC HISTORICAL  08/02/2016   IR US GUIDE VASC ACCESS RIGHT 08/02/2016 Greggory Keen, MD [facility]  . IR GENERIC HISTORICAL  08/02/2016   IR FLUORO GUIDE CV LINE RIGHT 08/02/2016 Greggory Keen, MD [facility]  . LEFT HEART CATH AND CORONARY ANGIOGRAPHY N/A 09/20/2016   Procedure: Left Heart Cath and Coronary Angiography;  Surgeon: [name], MD;  Location: [place] CV LAB;  Service: Cardiovascular;  Laterality: N/A;  . LEFT HEART CATH AND  CORS/GRAFTS ANGIOGRAPHY N/A 10/13/2016   Procedure: Left Heart Cath and Cors/Grafts Angiography;  Surgeon: Troy Sine, MD;  Location: [place] CV LAB;  Service: Cardiovascular;  Laterality: N/A;  . LEFT HEART CATHETERIZATION WITH CORONARY ANGIOGRAM N/A 12/15/2011   Procedure: LEFT HEART CATHETERIZATION WITH CORONARY ANGIOGRAM;  Surgeon: Burnell Blanks, MD;  Location: [facility] CATH LAB;  Service: Cardiovascular;  Laterality: N/A;  . LEFT HEART CATHETERIZATION WITH CORONARY ANGIOGRAM N/A 01/10/2012   Procedure: LEFT HEART CATHETERIZATION WITH CORONARY ANGIOGRAM;  Surgeon: Peter M Martinique, MD;  Location: [facility] CATH LAB;  Service: Cardiovascular;  Laterality: N/A;  . LEFT HEART CATHETERIZATION WITH CORONARY ANGIOGRAM N/A 06/08/2012   Procedure: LEFT HEART CATHETERIZATION WITH CORONARY ANGIOGRAM;  Surgeon: Burnell Blanks, MD;  Location: [facility] CATH LAB;  Service: Cardiovascular;  Laterality: N/A;  . LEFT HEART CATHETERIZATION WITH CORONARY/GRAFT ANGIOGRAM N/A 12/10/2013   Procedure: LEFT HEART CATHETERIZATION WITH Beatrix Fetters;  Surgeon: [name], MD;  Location: [facility] CATH LAB;  Service: Cardiovascular;  Laterality: N/A;  . OVARY SURGERY  ovarian cancer  . REVISION OF ARTERIOVENOUS GORETEX GRAFT N/A 02/24/2017   Procedure: REVISION OF ARTERIOVENOUS GORETEX GRAFT (RESECTION);  Surgeon: Katha Cabal, MD;  Location: [facility] ORS;  Service: Vascular;  Laterality: N/A;  [place] N/A 10/15/2013   Procedure: Fistulogram;  Surgeon: Serafina Mitchell, MD;  Location: [facility] CATH LAB;  Service: Cardiovascular;  Laterality: N/A;  . THROMBECTOMY / ARTERIOVENOUS GRAFT REVISION  2011   left upper arm  . TUBAL LIGATION  1980's  . UPPER EXTREMITY ANGIOGRAPHY Bilateral 12/06/2016   Procedure: Upper Extremity Angiography;  Surgeon: Katha Cabal, MD;  Location: [place] CV LAB;  Service: Cardiovascular;  Laterality: Bilateral;    Allergies: Aspirin; Penicillins; Amlodipine; Bactrim  [sulfamethoxazole-trimethoprim]; Contrast media [iodinated diagnostic agents]; Iron; Nitrofurantoin; Tylenol [acetaminophen]; Gabapentin; Dexilant [dexlansoprazole]; Levaquin [levofloxacin in d5w]; Morphine and related; Plavix [clopidogrel bisulfate]; Protonix [pantoprazole sodium]; and Venofer [ferric oxide]  Medications: Prior to Admission medications   Medication Sig Start Date End Date Taking? Authorizing Provider  ALPRAZolam (XANAX) 0.25 MG tablet Take 0.25 mg by mouth 2 (two) times daily as needed for anxiety or sleep.  11/19/15  Yes [provider]  amiodarone (PACERONE) 200 MG tablet Take 1 tablet (200 mg total) by mouth daily. 12/08/16  Yes Herminio Commons, MD  aspirin EC 81 MG tablet Take 81 mg by mouth daily.    Yes [provider]  carvedilol (COREG) 6.25 MG tablet Take 1 tablet (6.25 mg total) by mouth 2 (two) times daily with a meal. 11/16/16  Yes Herminio Commons, MD  cinacalcet (SENSIPAR) 30 MG tablet Take 30 mg by mouth daily after supper.   Yes [provider]  cloNIDine (CATAPRES) 0.2 MG tablet Take 1 tablet (0.2 mg total) by mouth 3 (three) times daily. Patient taking differently: Take 0.2 mg by mouth 2 (two) times daily as needed (high blood pressure).  12/08/16  Yes Herminio Commons, MD  diphenhydrAMINE (BENADRYL) 25 mg capsule Take 50 mg by mouth daily as needed for itching or allergies.    Yes [provider]  epoetin alfa (EPOGEN,PROCRIT) 43329 UNIT/ML injection Inject 1 mL (10,000 Units total) into the vein every Monday, Wednesday, and Friday with hemodialysis. 04/11/16  Yes Orvan Falconer, MD  fluticasone (FLONASE) 50 MCG/ACT nasal spray Place 2 sprays into the nose at bedtime as needed for allergies.    Yes [provider]  isosorbide mononitrate (IMDUR) 120 MG 24 hr tablet Take 1 tablet (120 mg total) by mouth daily. 01/21/17  Yes Caren Griffins, MD  multivitamin (RENA-VIT) TABS tablet Take 1 tablet by mouth daily.   Yes  [provider]  nitroGLYCERIN (NITROSTAT) 0.4 MG SL tablet Place 1 tablet (0.4 mg total) under the tongue every 5 (five) minutes as needed for chest pain. 08/02/16  Yes Buriev, Arie Sabina, MD  omeprazole (PRILOSEC) 20 MG capsule Take 20 mg by mouth daily.  09/12/16  Yes [provider]  ondansetron (ZOFRAN-ODT) 4 MG disintegrating tablet Take 4 mg by mouth 2 (two) times daily as needed for nausea or vomiting.   Yes [provider]  oxycodone (OXY-IR) 5 MG capsule Take 1 capsule (5 mg total) by mouth every 6 (six) hours as needed for pain. 02/24/17  Yes Schnier, Dolores Lory, MD  sevelamer carbonate (RENVELA) 800 MG tablet Take 800 mg by mouth 3 (three) times daily with meals.   Yes [provider]  simvastatin (ZOCOR) 20 MG tablet TAKE ONE TABLET BY MOUTH AT BEDTIME 02/13/17  Yes  Herminio Commons, MD  ticagrelor (BRILINTA) 90 MG TABS tablet Take 90 mg by mouth 2 (two) times daily.   Yes [provider]  hydrALAZINE (APRESOLINE) 50 MG tablet Take 2 tablets (100 mg total) by mouth 3 (three) times daily. Patient taking differently: Take 100 mg by mouth 2 (two) times daily as needed (for high blood pressure readings).  11/10/16 02/20/17  Herminio Commons, MD     Family History  Problem Relation Age of Onset  . Heart disease Mother        Heart Disease before age 82  . Hyperlipidemia Mother   . Hypertension Mother   . Diabetes Mother   . Heart attack Mother   . Heart disease Father        Heart Disease before age 34  . Hyperlipidemia Father   . Hypertension Father   . Diabetes Father   . Diabetes Sister   . Hypertension Sister   . Diabetes Brother   . Hyperlipidemia Brother   . Heart attack Brother   . Hypertension Sister   . Heart attack Brother   . Other Unknown        noncontributory for early CAD  . Colon cancer Neg Hx   . Esophageal cancer Neg Hx   . Liver disease Neg Hx   . Kidney disease Neg Hx   . Colon polyps Neg Hx     Social  History   Social History  . Marital status: Married    Spouse name: N/A  . Number of children: N/A  . Years of education: N/A   Social History Main Topics  . Smoking status: Never Smoker  . Smokeless tobacco: Never Used  . Alcohol use No  . Drug use: No  . Sexual activity: Yes    Birth control/ protection: Surgical   Other Topics Concern  . None   Social History Narrative   Lives in [place], [place] with husband.  Dialysis pt - [facility].    Review of Systems: A 12 point ROS discussed and pertinent positives are indicated in the HPI above.  All other systems are negative.  Review of Systems  Constitutional: Positive for activity change and appetite change. Negative for fatigue and fever.  Respiratory: Negative for shortness of breath.   Cardiovascular: Negative for chest pain.  Gastrointestinal: Positive for abdominal pain and nausea.  Neurological: Negative for weakness.  Psychiatric/Behavioral: Negative for behavioral problems and confusion.    Vital Signs: BP (!) 151/48 (BP Location: Left Arm)   Pulse 64   Temp 97.6 F (36.4 C) (Oral)   Resp 18   Ht 5\' 2"  (1.575 m)   Wt 126 lb 1.6 oz (57.2 kg)   SpO2 96%   BMI 23.06 kg/m   Physical Exam  Constitutional: She is oriented to person, place, and time.  Cardiovascular: Normal rate, regular rhythm and normal heart sounds.   Pulmonary/Chest: Effort normal.  Abdominal: Soft. There is tenderness.  Musculoskeletal: Normal range of motion.  Neurological: She is alert and oriented to person, place, and time.  Skin: Skin is warm and dry.  Psychiatric: She has a normal mood and affect. Her behavior is normal. Judgment and thought content normal.  Nursing note and vitals reviewed.   Imaging: Ct Abdomen Pelvis Wo Contrast  Result Date: 03/22/2017 CLINICAL DATA:  76 y/o F; generalized abdominal pain and abdominal distention. EXAM: CT ABDOMEN AND PELVIS WITHOUT CONTRAST TECHNIQUE: Multidetector CT imaging of the abdomen and pelvis was  performed following the  standard protocol without IV contrast. COMPARISON:  11/18/2016 CT of abdomen and pelvis. FINDINGS: Lower chest: Mild cardiomegaly. Severe coronary artery calcification. Left lower lobe platelike atelectasis. Hepatobiliary: No focal liver abnormality. Cholelithiasis. No intra or extrahepatic biliary ductal dilatation. Pancreas: Unremarkable. No pancreatic ductal dilatation or surrounding inflammatory changes. Spleen: Normal in size without focal abnormality. Adrenals/Urinary Tract: Normal adrenal glands. Right upper kidney mass measuring 2.9 cm and left kidney interpolar mass measuring 1.6 cm are stable. Severe atrophy of kidneys. No hydronephrosis. Bladder is collapsed. Stomach/Bowel: Patent rectosigmoid and cecal region anastomosis. Pancolonic diverticulosis. The short segment of small bowel inflammatory changes with edema extending into the mesentery centered around a small focus of air extending beyond the wall of the small bowel (series 5, image 29) which may represent a contained perforation or inflamed small bowel diverticulum. No bowel obstruction. Vascular/Lymphatic: Aortic atherosclerosis with severe calcific. No enlarged abdominal or pelvic lymph nodes. Reproductive: Status post hysterectomy. No adnexal masses. Other: Small volume of ascites within the left hemiabdomen. Musculoskeletal: No fracture is seen. IMPRESSION: 1. Short segment of inflamed small bowel surrounding a small focus of air extending beyond the bowel wall which may represent a contained perforation or small bowel diverticulitis. Trace ascites is likely reactive. 2. Mild cardiomegaly and severe coronary artery calcification. 3. Cholelithiasis. 4. Stable right kidney upper pole 2.9 cm indeterminate mass and left kidney interpolar 1.6 cm indeterminate mass. Renal protocol CT with and without contrast is recommended to further characterize. This recommendation follows ACR consensus guidelines: Management of the  Incidental Renal Mass on CT: A White Paper of the ACR Incidental Findings Committee. J Am Coll Radiol [phone]. 5. Pan colonic diverticulosis. Electronically Signed   By: Kristine Garbe M.D.   On: 03/22/2017 06:47   US Venous Img Upper Uni Left  Result Date: 03/23/2017 CLINICAL DATA:  Left hand pain since yesterday. Left hand turning cold for 2 weeks. Patient has a Gore-Tex graft in the lower arm and antecubital fossa region. EXAM: LEFT UPPER EXTREMITY VENOUS DOPPLER ULTRASOUND TECHNIQUE: Gray-scale sonography with graded compression, as well as color Doppler and duplex ultrasound were performed to evaluate the upper extremity deep venous system from the level of the subclavian vein and including the jugular, axillary, basilic, radial, ulnar and upper cephalic vein. Spectral Doppler was utilized to evaluate flow at rest and with distal augmentation maneuvers. COMPARISON:  None. FINDINGS: Contralateral Subclavian Vein: Respiratory phasicity is normal and symmetric with the symptomatic side. No evidence of thrombus. Normal compressibility. Internal Jugular Vein: No evidence of thrombus. Normal compressibility, respiratory phasicity and response to augmentation. Subclavian Vein: No evidence of thrombus. Normal compressibility, respiratory phasicity and response to augmentation. Axillary Vein: No evidence of thrombus. Normal compressibility, respiratory phasicity and response to augmentation. Cephalic Vein: Irregular hypoechoic to mildly hyperechoic area in the left forearm near the patient's dialysis graft that may reflect a chronically thrombosed cephalic vein. Basilic Vein: No evidence of thrombus. Normal compressibility, respiratory phasicity and response to augmentation. Brachial Veins: No evidence of thrombus. Normal compressibility, respiratory phasicity and response to augmentation. Radial Veins: No evidence of thrombus. Normal compressibility, respiratory phasicity and response to  augmentation. Ulnar Veins: No evidence of thrombus. Normal compressibility, respiratory phasicity and response to augmentation. Venous Reflux:  None visualized. Other Findings:  The dialysis fistula is widely patent. IMPRESSION: 1. Probable chronic thrombosis in the cephalic vein in the patient's dialysis fistula in the left arm. 2. Remaining veins are widely patent. Dialysis fistula is widely patent. Electronically Signed   By: Shanon Brow  [place]rm[place] M.D.   On: 03/23/2017 15:55   Dg Abdomen Acute W/chest  Result Date: 03/22/2017 CLINICAL DATA:  Diffuse abdominal pain. EXAM: DG ABDOMEN ACUTE W/ 1V CHEST COMPARISON:  Radiograph 02/08/2017.  CT 11/18/2016 FINDINGS: Right-sided dialysis catheter with tip in the distal SVC. Cardiomegaly. Post median sternotomy. Bibasilar atelectasis, right greater than left. No pulmonary edema or pleural fluid. No free intra-abdominal air. No bowel dilatation to suggest obstruction. Scattered retained barium within colonic diverticula. Multiple enteric sutures and surgical clips in the pelvis. Small volume of colonic stool. IMPRESSION: 1. Normal bowel gas pattern. No free air. Retained barium within colonic diverticulum. 2. Mild bibasilar atelectasis.  Mild cardiomegaly. Electronically Signed   By: Jeb Levering M.D.   On: 03/22/2017 06:00   US Abdominal Pelvic Art/vent Flow Doppler  Result Date: 03/26/2017 CLINICAL DATA:  76 year old female with possible mesenteric ischemia. End-stage renal disease Cardiovascular risk factors include hypertension, known prior coronary artery disease, known prior vascular disease with vascular surgery. EXAM: [place] MESENTERIC ARTERIAL DOPPLER COMPARISON:  Noncontrast CT 03/22/2017, 11/18/2016 FINDINGS: Celiac axis: 262 cm/sec Celiac axis with inspiration: 220 cm/sec Celiac axis with expiration: 262 cm/sec Splenic artery: Splenic artery not visualize Hepatic artery: Hepatic artery not visualized SMA: 433 cm/sec IMA: 375 cm/sec IMPRESSION: Elevated  velocities at the origin of the celiac artery, superior mesenteric artery, inferior mesenteric artery, compatible with greater than 70% stenosis at all 3 mesenteric vessels origin. Signed, Dulcy Fanny. Earleen Newport, DO [facility] Electronically Signed   By: Corrie Mckusick D.O.   On: 03/26/2017 10:46    Labs:  CBC:  Recent Labs  03/22/17 0602 03/23/17 0519 03/24/17 0518 03/25/17 0613  WBC 8.6 5.3 6.1 6.4  HGB 13.5 10.8* 11.9* 11.3*  HCT 42.8 33.9* 36.8 34.3*  PLT 260 201 212 237    COAGS:  Recent Labs  05/08/16 2351  09/07/16 0135 09/20/16 0347 10/09/16 2325 01/24/17 1326 02/08/17 1205  INR 0.92  < > 0.96 1.07 0.91 1.05 1.01  APTT 27  --  31  --   --  34 32  < > = values in this interval not displayed.  BMP:  Recent Labs  03/24/17 0518 03/25/17 0613 03/26/17 0801 03/27/17 0241  NA 133* 131* 132* 129*  K 4.6 4.9 4.5 5.4*  CL 95* 94* 92* 95*  CO2 26 27 27 25   GLUCOSE 73 103* 66 48*  BUN 36* 22* 23* 30*  CALCIUM 7.9* 8.1* 8.5* 7.9*  CREATININE 8.15* 6.08* 5.89* 7.82*  GFRNONAA 4* 6* 6* 4*  GFRAA 5* 7* 7* 5*    LIVER FUNCTION TESTS:  Recent Labs  11/17/16 2206 01/24/17 1326 02/08/17 1205  03/22/17 0602 03/24/17 0518 03/26/17 0801 03/27/17 0241  BILITOT 0.2* 0.7 0.4  --  0.5  --   --   --   AST 27 22 17   --  19  --   --   --   ALT 18 13* 10*  --  14  --   --   --   ALKPHOS 53 119 99  --  104  --   --   --   PROT 5.8* 7.3 5.3*  --  7.4  --   --   --   ALBUMIN 3.0* 3.9 2.9*  < > 4.0 3.1* 3.0* 2.9*  < > = values in this interval not displayed.  TUMOR MARKERS: No results for input(s): AFPTM, CEA, CA199, CHROMGRNA in the last 8760 hours.  Assessment and Plan:  Post prandial abd pain Worsening Vasc study does reveal stenosis Scheduled for CTA - premedicated secondary dye allergy Will check CTA in am with Rad May need arteriogram with possible angioplasty/stent   Thank you for this interesting  consult.  I greatly enjoyed meeting Shawna [name] and look forward to participating in their care.  A copy of this report was sent to the requesting provider on this date.  Electronically Signed: Lavonia Drafts, PA-C 03/27/2017, 11:58 AM   I spent a total of 40 Minutes    in face to face in clinical consultation, greater than 50% of which was counseling/coordinating care for CTA abdomen

## 2017-03-27 NOTE — Procedures (Signed)
I was present at this dialysis session. I have reviewed the session itself and made appropriate changes.   2K bath. TDC Qb 400 UF goal 1.3L. Tol well.  Says has itching at times near end of HD. Follow.  For CTA A/P and IR to eval for mesenteric ischemia.   Filed Weights   03/26/17 2138 03/27/17 1215 03/27/17 1230  Weight: 57.2 kg (126 lb 1.6 oz) 57.3 kg (126 lb 5.2 oz) 57.3 kg (126 lb 5.2 oz)     Recent Labs Lab 03/27/17 1315  NA 128*  K 4.5  CL 92*  CO2 27  GLUCOSE 122*  BUN 35*  CREATININE 8.66*  CALCIUM 7.5*  PHOS 5.5*     Recent Labs Lab 03/22/17 0602  03/24/17 0518 03/25/17 0613 03/27/17 1315  WBC 8.6  < > 6.1 6.4 4.7  NEUTROABS 7.2  --   --   --   --   HGB 13.5  < > 11.9* 11.3* 10.8*  HCT 42.8  < > 36.8 34.3* 33.5*  MCV 105.2*  < > 103.1* 101.2* 99.1  PLT 260  < > 212 237 213  < > = values in this interval not displayed.  Scheduled Meds: . amiodarone  200 mg Oral Daily  . aspirin EC  81 mg Oral Daily  . bisacodyl  10 mg Rectal q12n4p  . carvedilol  6.25 mg Oral BID WC  . cinacalcet  30 mg Oral QPC supper  . [START ON 03/28/2017] diphenhydrAMINE  50 mg Oral Once  . doxercalciferol  2.5 mcg Intravenous Q M,W,F-HD  . famotidine  20 mg Oral QODAY  . [START ON 03/28/2017] heparin  5,000 Units Subcutaneous Q8H  . isosorbide mononitrate  120 mg Oral Daily  . [START ON 03/28/2017] multivitamin  1 tablet Oral QHS  . predniSONE  50 mg Oral Q6H  . sevelamer carbonate  800 mg Oral TID WC  . ticagrelor  90 mg Oral BID   Continuous Infusions: . sodium chloride    . sodium chloride    . sodium chloride    . sodium chloride    . dextrose    . ertapenem     PRN Meds:.sodium chloride, sodium chloride, sodium chloride, sodium chloride, ALPRAZolam, bisacodyl, camphor-menthol, diphenhydrAMINE, fluticasone, hydrALAZINE, nitroGLYCERIN, ondansetron (ZOFRAN) IV, oxyCODONE   Pearson Grippe  MD 03/27/2017, 3:23 PM

## 2017-03-28 ENCOUNTER — Encounter (HOSPITAL_COMMUNITY): Payer: Self-pay | Admitting: Radiology

## 2017-03-28 ENCOUNTER — Inpatient Hospital Stay (HOSPITAL_COMMUNITY): Payer: Medicare HMO

## 2017-03-28 DIAGNOSIS — R0789 Other chest pain: Secondary | ICD-10-CM

## 2017-03-28 DIAGNOSIS — K651 Peritoneal abscess: Secondary | ICD-10-CM

## 2017-03-28 HISTORY — PX: IR RADIOLOGY PERIPHERAL GUIDED IV START: IMG5598

## 2017-03-28 HISTORY — PX: IR US GUIDE VASC ACCESS RIGHT: IMG2390

## 2017-03-28 LAB — PROTIME-INR
INR: 0.98
PROTHROMBIN TIME: 12.9 s (ref 11.4–15.2)

## 2017-03-28 LAB — APTT: aPTT: 29 seconds (ref 24–36)

## 2017-03-28 LAB — GLUCOSE, CAPILLARY
GLUCOSE-CAPILLARY: 208 mg/dL — AB (ref 65–99)
Glucose-Capillary: 50 mg/dL — ABNORMAL LOW (ref 65–99)
Glucose-Capillary: 147 mg/dL — ABNORMAL HIGH (ref 65–99)
Glucose-Capillary: 213 mg/dL — ABNORMAL HIGH (ref 65–99)

## 2017-03-28 IMAGING — CT CT CTA ABD/PEL W/CM AND/OR W/O CM
2 of 9 series · 10 of 46 positions shown, 16 images · IV contrast (isovue)
Comparison: [DATE]

CLINICAL DATA: Mesenteric ischemia

EXAM:
CTA ABDOMEN AND PELVIS wITHOUT AND WITH CONTRAST
TECHNIQUE: Multidetector CT imaging of the abdomen and pelvis was performed
using the standard protocol during bolus administration of
intravenous contrast. Multiplanar reconstructed images and MIPs were
obtained and reviewed to evaluate the vascular anatomy.
CONTRAST:  80 cc Isovue 370

[Series 11: venous 5.0 · axial · portal-venous · 0.78mm/px · z∈[+994,+1309]mm · 8 of 83 slices shown, 13 images]
[im 10/83  soft-tissue]
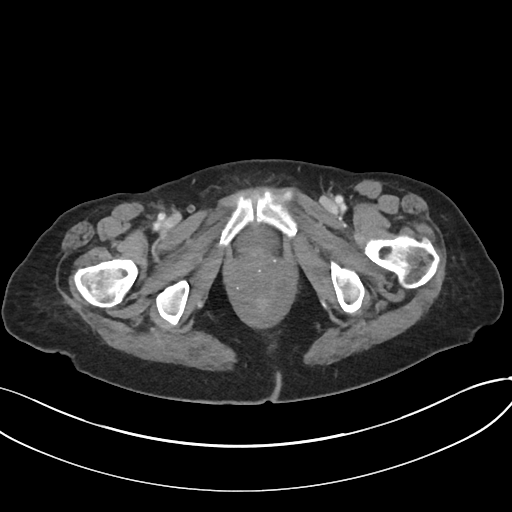
[im 10/83  bone]
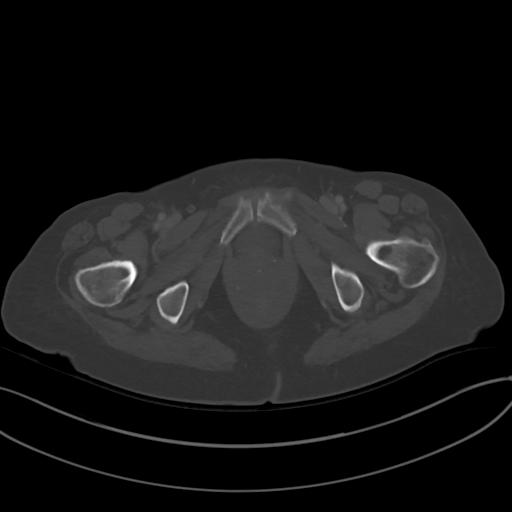
[im 19/83  soft-tissue]
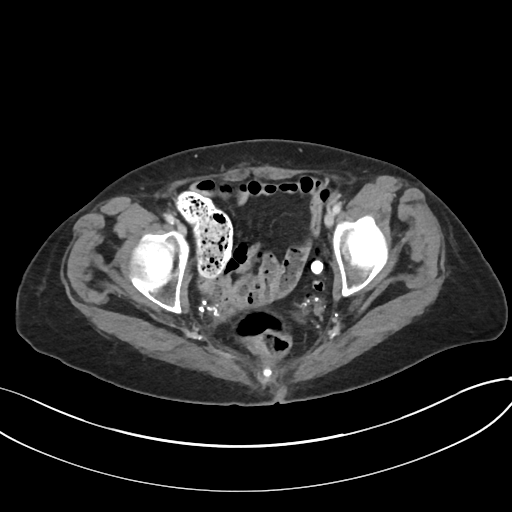
[im 28/83  soft-tissue]
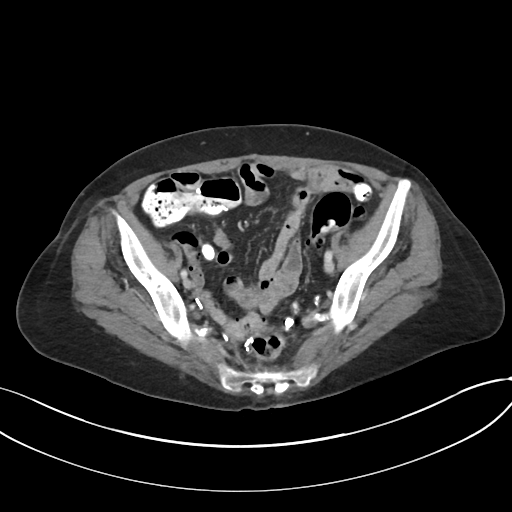
[im 37/83  soft-tissue]
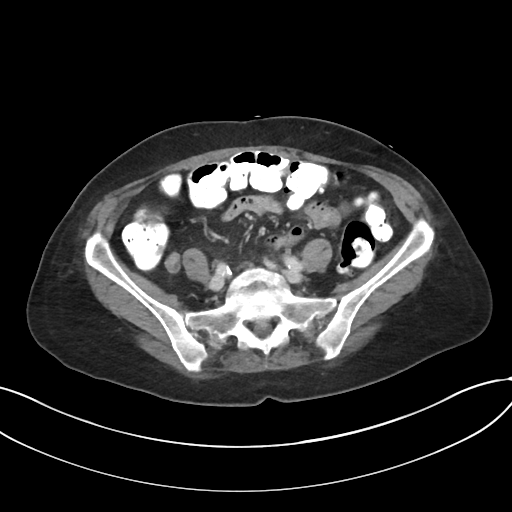
[im 46/83  soft-tissue]
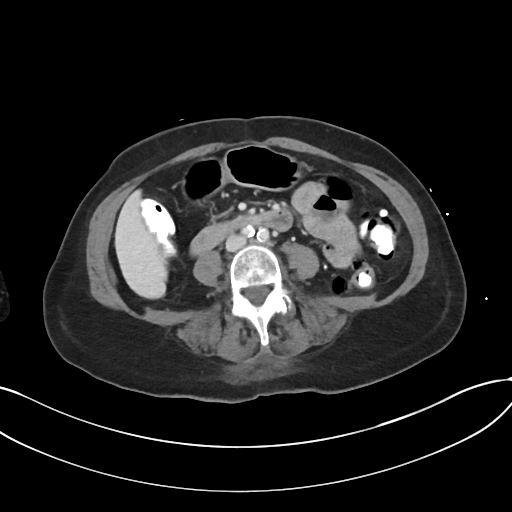
[im 46/83  lung]
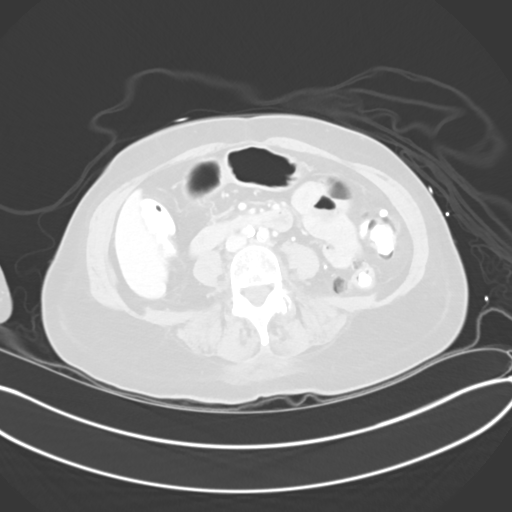
[im 55/83  soft-tissue]
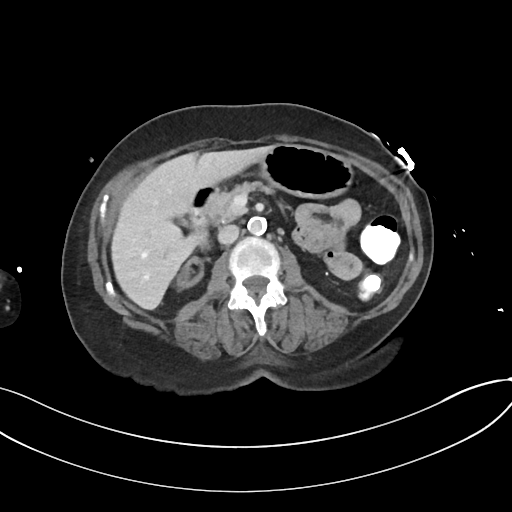
[im 55/83  lung]
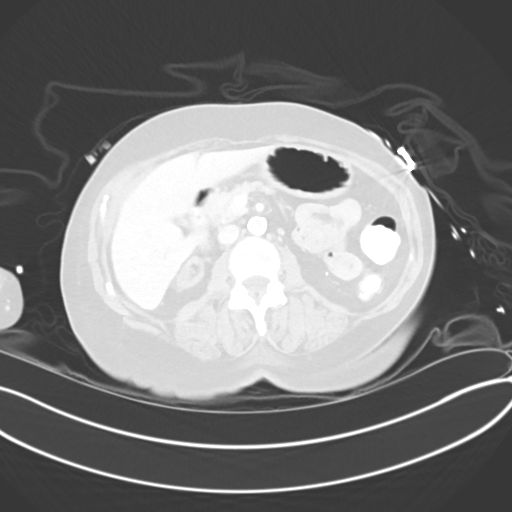
[im 64/83  soft-tissue]
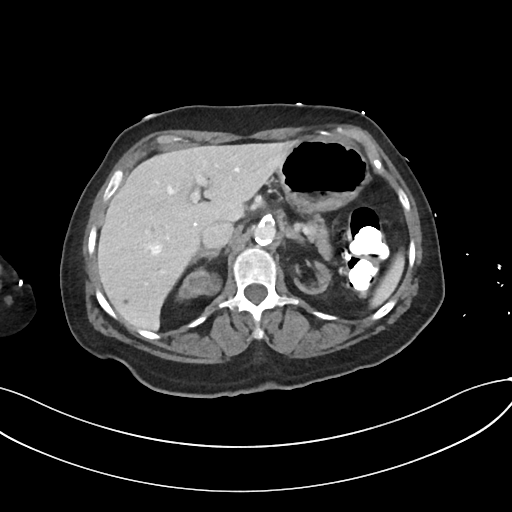
[im 64/83  lung]
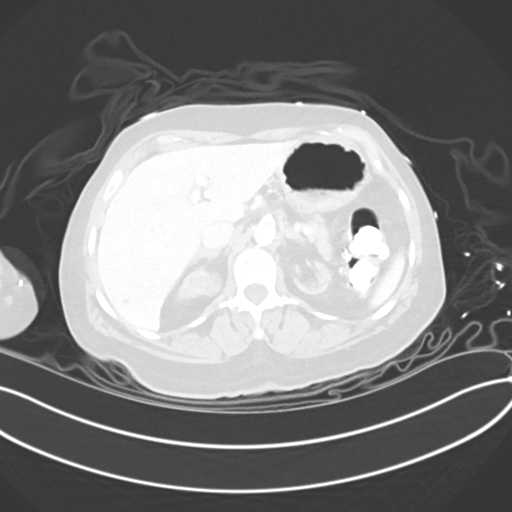
[im 73/83  soft-tissue]
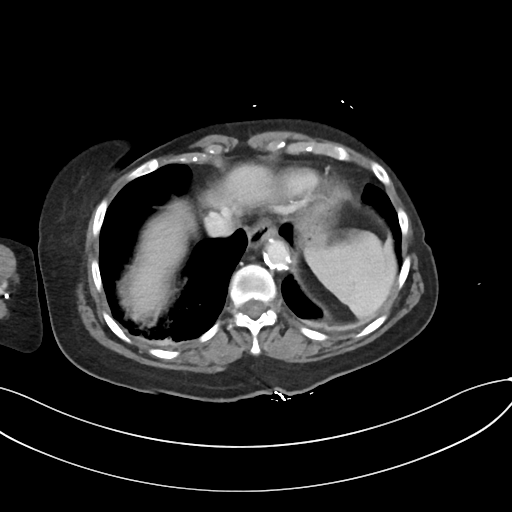
[im 73/83  lung]
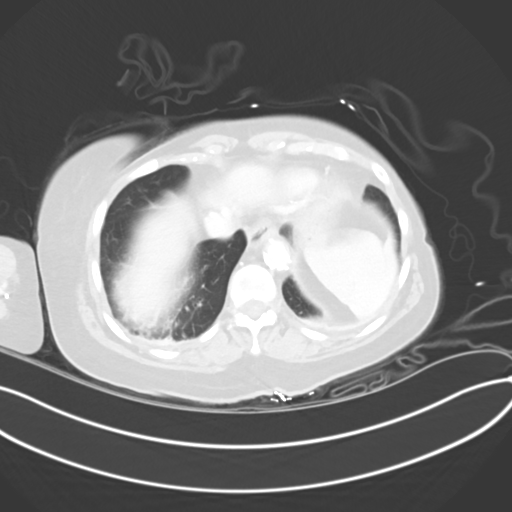

[Series 14: venous 2.0 cor · coronal · portal-venous · 0.66mm/px · 2 of 107 slices shown, 3 images]
[im 36/107  soft-tissue]
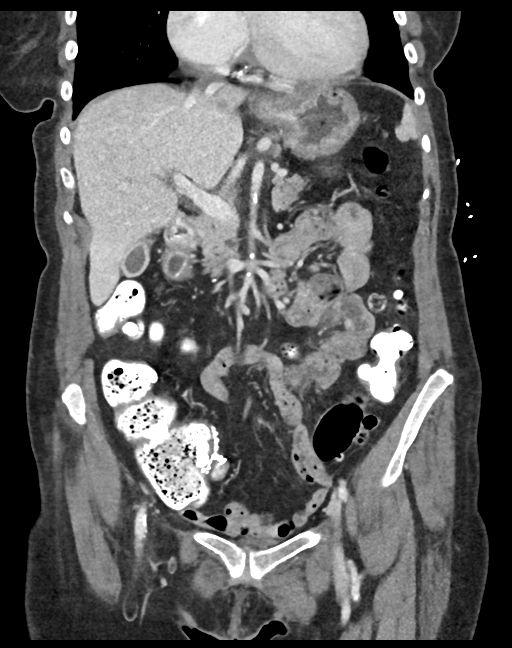
[im 36/107  bone]
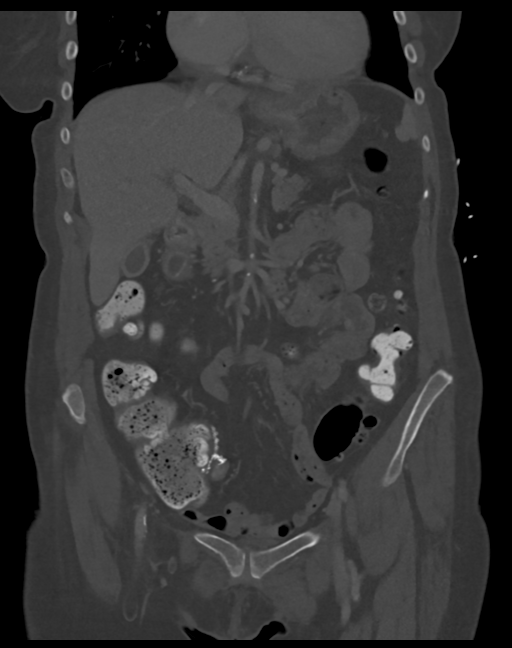
[im 71/107  soft-tissue]
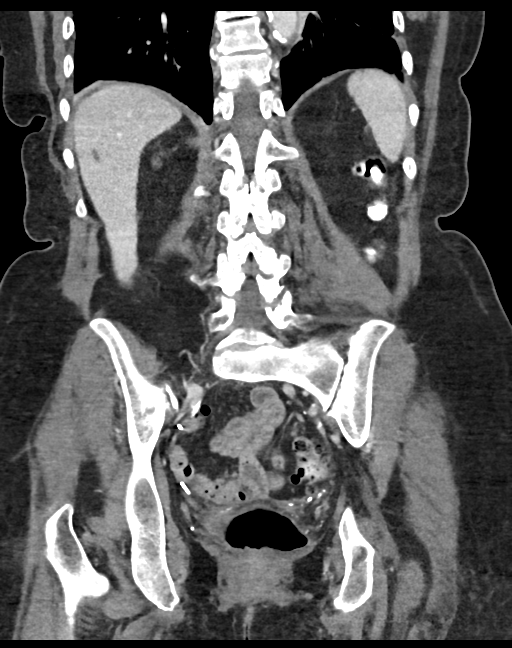

[10 of 46 positions shown; findings below may reference images not displayed]

FINDINGS: VASCULAR

Aorta: There are extensive atherosclerotic calcifications throughout
the aorta. There is little if any soft plaque visualized in the
lower thorax or abdomen. It is nonaneurysmal.

Celiac: There is 70-80% narrowing at the origin likely due with to a
combination of atherosclerosis an median arcuate ligament syndrome.
Branch vessels are patent.

SMA: There is 50% narrowing at the origin due to atherosclerotic
plaque and calcification. Beyond the origin, it is patent. Branch
vessels are grossly patent.

Renals: Renal arteries are severely diminutive an atrophic.

IMA: There is moderate narrowing at the origin. Branch vessels are
patent.

Inflow: Mild atherosclerotic calcifications in the right common and
external iliac arteries without significant narrowing. Mild disease
in the right internal iliac artery.

Similarly, there is mild atherosclerotic calcification and some soft
plaque in the left common iliac artery. The external iliac artery is
pain. Internal iliac artery is patent.

Proximal Outflow: The bilateral femoral arteries are grossly patent
within the confines of the examination. There is some soft plaque in
the right common femoral artery. There is also a small penetrating
atherosclerotic ulcer. See table position -[AB].3.

Veins: Hepatic, portal, splenic, superior mesenteric, and bilateral
renal veins are patent. IVC is patent. Bilateral common and external
iliac veins are patent.

Review of the MIP images confirms the above findings.

NON-VASCULAR

Lower chest: Dependent atelectasis bilaterally.

Hepatobiliary: Subcentimeter hypodensity in the right lobe of the
liver on image 20 is nonspecific. There is also a calcified
granuloma in the central liver. Gallbladder is decompressed causing
artificial wall thickening.

Pancreas: Unremarkable

Spleen: Unremarkable

Adrenals/Urinary Tract: Severe atrophy of the kidneys is present.
The appearance is unchanged compare to the prior study. There is a
cyst in the medial mid left kidney. There is an enhancing
heterogeneous mass in the upper pole of the right kidney which is
stable. Bladder is decompressed. Adrenal glands are within normal
limits.

Stomach/Bowel: There is a 2.2 x 2.2 cm gas and fluid filled abscess
in the left upper quadrant between small bowel loops. No evidence of
small-bowel obstruction. Inflammatory changes in the adjacent small
bowel are improved. No focal mass of the colon. Diverticulosis
throughout the length of the colon is noted. There are postoperative
changes in the location of the cecum. There are also postoperative
changes at the sigmoid rectum junction.

Lymphatic: No abnormal retroperitoneal adenopathy.

Reproductive: Uterus is absent.  Adnexa are unremarkable.

Other: There is no free fluid in the pelvis.

Musculoskeletal: No vertebral compression deformity. Renal
osteodystrophy changes in the spine are present.
IMPRESSION: VASCULAR

Significant narrowing at the origin of the celiac axis.

50% narrowing at the origin of the SMA.

Moderate narrowing at the origin of the IMA.

NON-VASCULAR

There is a 2.2 cm gas and fluid filled abscess within the left upper
quadrant between small bowel loops.

Stable right upper lobe renal mass. Please see prior dictation for
recommendations.

Tiny hypodensity in the liver is nonspecific but likely benign. If
patient has a history of malignancy, consider six-month follow-up
MRI.

## 2017-03-28 IMAGING — US IR RADIOLOGY PERIPHERAL GUIDED IV START
1 series · 1 of 1 positions shown · non-contrast
Comparison: none

INDICATION: 75-year-old with abdominal pain and being evaluated for mesenteric
venous access for the CTA examination. Patient is end-stage renal
disease and not a candidate for PICC line. Patient has a left upper
arm fistula which is maturing and a right jugular dialysis catheter.

[Series 1: ir radiology peripheral guided iv start · 1 of 1 slices shown]
[im 1/1]
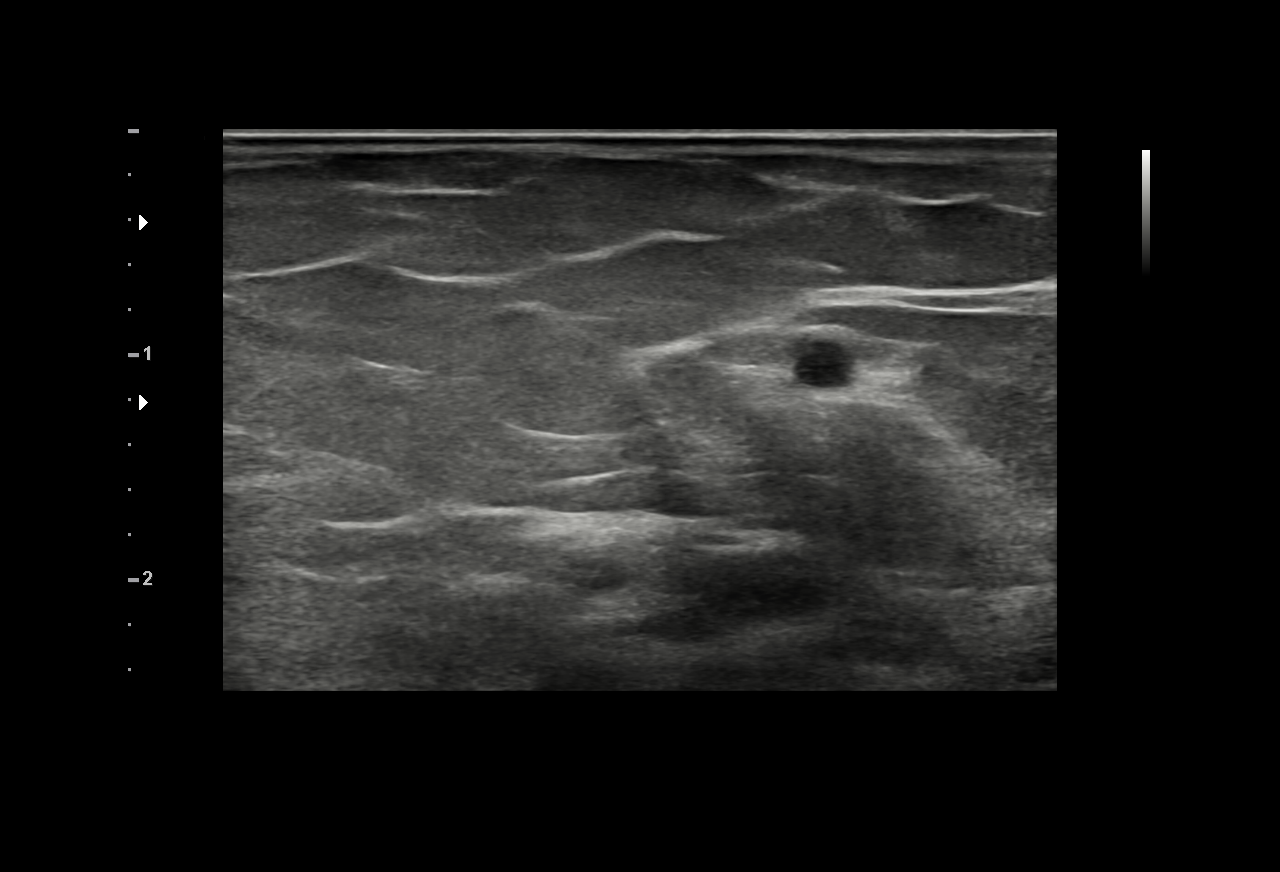

[1 of 1 positions shown; findings below may reference images not displayed]

EXAM:
PLACEMENT OF PERIPHERAL IV WITH ULTRASOUND GUIDANCE

MEDICATIONS:
None

ANESTHESIA/SEDATION:
None

FLUOROSCOPY TIME:  None).

COMPLICATIONS:
None immediate.

PROCEDURE:
Right upper arm was evaluated with ultrasound. A patent basilic vein
was identified. Right upper arm was prepped and draped in a sterile
fashion. Tourniquet was placed. Skin was anesthetized with 1%
lidocaine. 21 gauge needle directed into the right basilic vein with
ultrasound guidance. Micropuncture dilator set was placed. Catheter
aspirated and flushed well.
IMPRESSION: Successful placement of a peripheral IV in the right upper arm. This
is intended to be a temporary access that will be removed after the
CTA procedure.

## 2017-03-28 MED ORDER — LIDOCAINE HCL (PF) 1 % IJ SOLN
INTRAMUSCULAR | Status: DC | PRN
Start: 2017-03-28 — End: 2017-03-29
  Administered 2017-03-28: 2 mL

## 2017-03-28 MED ORDER — LIDOCAINE HCL (PF) 1 % IJ SOLN
INTRAMUSCULAR | Status: AC
  Filled 2017-03-28: qty 30

## 2017-03-28 MED ORDER — IOPAMIDOL (ISOVUE-370) INJECTION 76%: 100.0000 mL | Freq: Once | INTRAVENOUS | Status: DC | PRN

## 2017-03-28 MED ORDER — IOPAMIDOL (ISOVUE-370) INJECTION 76%
INTRAVENOUS | Status: AC
  Administered 2017-03-28: 80 mL
  Filled 2017-03-28: qty 100

## 2017-03-28 NOTE — Progress Notes (Signed)
[facility] Progress Note   Dialysis Orders: [facility]  EDW 56.5. 3 hours 45 min profile 2 HD Bath 2K/2.5 calc, Heparin yes- 2000 bolus+ 200 per hour right .IJ TDCand left upper AVGG 02/24/17 - Dr. Ronalee Belts placed PC 350 BFR, 600 DFR- BP will drop fast according to nursing. Epo 7800 per HD , hectorol 2.5 mcg q HD, venofer 50 , Epo 7800 , fosrenol 1000 TID for phos binding   Assessment/Plan: 1. Abdominal pain due to possible mesenteric ischemia with small bowel enteritis . - IR to evaluate - on a 7 day course of antibiotics; CT angio this am - results pending- requiring prednisone due to contrast allergy 2. ESRD - [name]- left upper AVGG is patent and 56 weeks old - should be ok to with Wednesday dialysis. 3. Hypertension/volume  - keep systolic BP > 885 due to risk of mesenteric ischemia/volume status ok net UF 894 9/17 with post wt 55.8 4. Anemia  - hgb 11.3 > 10.8-  on Epo 7800 TIW - continue weekly Fe; start low dose Aranesp if hgb drops < 10.5 5. Metabolic bone disease -  Continue hectorol/sensipar - NPO so no binders until eating P 5.5  6. Nutrition -alb 2.7 npo - advance as able 7. CAD/hx NSTEMI 8. Renal mass - 1.6 cm left kidney nodule new in 5/18- stable 02/2017, right kidney nodule 2.2 cm 2014 to 2.9 11/2016- stable 02/2017- she tells me she had a urology appointment for evaluation but missed it due to a hospitalization and the appointment was never rescheduled - this will need to be done after discharge.  Myriam Jacobson, PA-C [facility]per [phone] 03/28/2017,9:06 AM  LOS: 6 days   Subjective:   C/o that dialysis took too long yesterday.  Objective Vitals:   03/27/17 1753 03/27/17 2054 03/28/17 0436 03/28/17 0700  BP: (!) 166/54 (!) 119/98 (!) 178/71 (!) 166/49  Pulse: 71 67 77 66  Resp:  18 19 16   Temp:  98.6 F (37 C) 97.8 F (36.6 C) 98.2 F (36.8 C)  TempSrc:  Oral Oral Oral  SpO2:  99%  96%  Weight:  55.9 kg (123 lb 3.8  oz)    Height:       Physical Exam General: NAD Heart: RRR Lungs: no rales Abdomen: soft NTND + BS Extremities: no LE edema  Dialysis Access: right IJ and left upper AVGG + bruit  Additional Objective Labs: Basic Metabolic Panel:  Recent Labs Lab 03/26/17 0801 03/27/17 0241 03/27/17 1315  NA 132* 129* 128*  K 4.5 5.4* 4.5  CL 92* 95* 92*  CO2 27 25 27   GLUCOSE 66 48* 122*  BUN 23* 30* 35*  CREATININE 5.89* 7.82* 8.66*  CALCIUM 8.5* 7.9* 7.5*  PHOS 5.4* 5.5* 5.5*   Liver Function Tests:  Recent Labs Lab 03/22/17 0602  03/26/17 0801 03/27/17 0241 03/27/17 1315  AST 19  --   --   --   --   ALT 14  --   --   --   --   ALKPHOS 104  --   --   --   --   BILITOT 0.5  --   --   --   --   PROT 7.4  --   --   --   --   ALBUMIN 4.0  < > 3.0* 2.9* 2.7*  < > = values in this interval not displayed.  Recent Labs Lab 03/22/17 0602  LIPASE 31  CBC:  Recent Labs Lab 03/22/17 0602 03/23/17 0519 03/24/17 0518 03/25/17 0613 03/27/17 1315  WBC 8.6 5.3 6.1 6.4 4.7  NEUTROABS 7.2  --   --   --   --   HGB 13.5 10.8* 11.9* 11.3* 10.8*  HCT 42.8 33.9* 36.8 34.3* 33.5*  MCV 105.2* 105.0* 103.1* 101.2* 99.1  PLT 260 201 212 237 213   Blood Culture    Component Value Date/Time   SDES BLOOD RIGHT ARM 10/10/2016 0124   SPECREQUEST IN PEDIATRIC BOTTLE Blood Culture adequate volume 10/10/2016 0124   CULT NO GROWTH 5 DAYS 10/10/2016 0124   REPTSTATUS 10/15/2016 FINAL 10/10/2016 0124    Cardiac Enzymes: No results for input(s): CKTOTAL, CKMB, CKMBINDEX, TROPONINI in the last 168 hours. CBG:  Recent Labs Lab 03/27/17 1059 03/27/17 1143 03/27/17 1640 03/27/17 2058 03/28/17 0745  GLUCAP 57* 175* 50* 131* 208*   Iron Studies: No results for input(s): IRON, TIBC, TRANSFERRIN, FERRITIN in the last 72 hours. Lab Results  Component Value Date   INR 0.98 03/28/2017   INR 1.01 02/08/2017   INR 1.05 01/24/2017   Studies/Results: US Abdominal Pelvic Art/vent Flow  Doppler  Result Date: 03/26/2017 CLINICAL DATA:  76 year old female with possible mesenteric ischemia. End-stage renal disease Cardiovascular risk factors include hypertension, known prior coronary artery disease, known prior vascular disease with vascular surgery. EXAM: [place] MESENTERIC ARTERIAL DOPPLER COMPARISON:  Noncontrast CT 03/22/2017, 11/18/2016 FINDINGS: Celiac axis: 262 cm/sec Celiac axis with inspiration: 220 cm/sec Celiac axis with expiration: 262 cm/sec Splenic artery: Splenic artery not visualize Hepatic artery: Hepatic artery not visualized SMA: 433 cm/sec IMA: 375 cm/sec IMPRESSION: Elevated velocities at the origin of the celiac artery, superior mesenteric artery, inferior mesenteric artery, compatible with greater than 70% stenosis at all 3 mesenteric vessels origin. Signed, Dulcy Fanny. Earleen Newport, DO [facility] Electronically Signed   By: Corrie Mckusick D.O.   On: 03/26/2017 10:46   Medications: . sodium chloride    . sodium chloride    . sodium chloride    . sodium chloride    . sodium chloride    . sodium chloride    . dextrose 20 mL/hr at 03/27/17 1747  . ertapenem Stopped (03/28/17 0047)   . amiodarone  200 mg Oral Daily  . aspirin EC  81 mg Oral Daily  . bisacodyl  10 mg Rectal q12n4p  . carvedilol  6.25 mg Oral BID WC  . cinacalcet  30 mg Oral QPC supper  . doxercalciferol  2.5 mcg Intravenous Q M,W,F-HD  . famotidine  20 mg Oral QODAY  . [START ON 03/29/2017] heparin  5,000 Units Subcutaneous Q8H  . isosorbide mononitrate  120 mg Oral Daily  . multivitamin  1 tablet Oral QHS  . sevelamer carbonate  800 mg Oral TID WC  . ticagrelor  90 mg Oral BID

## 2017-03-28 NOTE — Plan of Care (Signed)
Problem: Nutrition: Goal: Adequate nutrition will be maintained Outcome: Progressing Patient wanting to eat. Aware of blood sugars running. Waiting to hear from IR about procedure.

## 2017-03-28 NOTE — Progress Notes (Addendum)
PROGRESS NOTE   Shawna [name]  TMH:[id number]    DOB: 03/17/1941    DOA: 03/22/2017  PCP: [name], MD   I have briefly reviewed patients previous medical records in [facility].  Brief Narrative:  76 year old married female, PMH of ESRD on MWF HD (Dr. Lowanda Foster, Nephrology), CAD/NSTEMI/PCI, chronic diastolic CHF (Dr. Domenic Polite, Cardiology), HTN, HLD, iron deficiency anemia, GI bleed due to small bowel angiodysplasia, initially admitted to [facility] on 03/22/17 for acute onset of lower abdominal pain and diarrhea. CT revealed focal enteritis-diverticulitis and question of contained perforation. GI and general surgery consulted and initially managed conservatively for presumed enteritis with bowel rest and IV antibiotics. Due to ongoing postprandial abdominal pain, suspected bowel ischemia and underwent an abdominal Doppler which showed more than 70% stenosis of all 3 mesenteric vessels at their origins. GI/Gen. surgery discussed with IR at [facility] and patient was transferred over on 9/16 for for possible stenting of mesenteric vessels. IR and nephrology consulted at [facility].   Assessment & Plan:   Active Problems:   SBO (small bowel obstruction) (HCC)   Enteritis   Diverticulitis   Mesenteric ischemia ([place])   1. Abdominal pain suspected due to mesenteric ischemia & small bowel enteritis/Abscess: History as outlined above. Initially managed conservatively for possible enteritis/diverticulitis with bowel rest and IV ertapenem. Due to ongoing postprandial abdominal pain, underwent abdominal Doppler ultrasound (unable to get CTA abdomen due to no IV access for dye administration) which showed more than 70% stenosis at all 3 mesenteric vessel origins. Transferred from [facility] to [facility] on 9/16 for evaluation by interventional radiology for possible intervention/stenting. S/P CTA abdomen 9/18 (report as below), discussed with Dr. Barbie Banner, IR who indicated  that based on patient's study, stenting may not be indicated but IR will do a formal consultation. CTA abdomen also showed 2.2 cm gas and fluid-filled abscess within the left upper quadrant between small bowel loops, not amenable to percutaneous drainage as per my discussion with IR. General surgery consulted. Continue IV meropenem for 7 days, may need to extend antibiotics due to new abscess. 2. ESRD on MWF HD: Nephrology was consulting for dialysis needs at [facility]. Status post HD on 9/17 3. Hyperkalemia: Secondary to ESRD in dialysis patient. Resolved after HD on 9/17. 4. Hyponatremia: Likely related to ESRD and management per nephrology. Stable. 5. Secondary hyperparathyroidism: Per nephrology. 6. Left upper extremity pain: Mostly in the hand. Left upper extremity Dopplers notable for probable chronic thrombosis in the cephalic veins in the patient's dialysis fistula in the left arm, remaining veins are widely patent, dialysis fistula is widely patent. Resolved  7. Essential hypertension: Mildly uncontrolled. Continue carvedilol, Imdur and when necessary hydralazine. 8. Anemia of chronic disease and ESRD: Stable. Follow periodically across dialysis. 9. CAD/prior NSTEMI: Continue aspirin, carvedilol, Imdur and Brillinta. CT abdomen and pelvis 9/12 showed severe coronary artery calcification. Late morning on 9/18, patient reported brief atypical epigastric pain on her way back from IR which resolved after a few minutes after taking her own sublingual NTG. ? GI related to intraabdominal pathology. Check EKG. consider further workup if she has recurrent chest pain. 10. Renal mass: CT abdomen 9/12 showed stable right kidney upper pole 2.9 cm indeterminate mass and left kidney interpolar 1.6 cm indeterminate mass. Renal protocol CT with and without contrast was recommended to further characterize, defer to nephrology. 11. Hypoglycemia: Blood glucose 48 mg per DL noted on a.m. labs 9/17. Currently  on  D5W and no hypoglycemic episodes since 9/17. Closely monitor. 12. ? History of A. fib: Noted in outpatient cardiologist progress note date/23/18. Remains on amiodarone, carvedilol, ASA and Brillinta. 13. Carotid artery disease: 14. Hyperlipidemia   DVT prophylaxis: Subcutaneous heparin  Code Status: Full  Family Communication: Discussed in detail with patient's spouse at bedside.  Disposition: DC home when medically improved.   Consultants:  Nephrology GI at [facility]  General surgery at [facility]  Procedures:  Hemodialysis   Antimicrobials:  IV Ertapenem    Subjective: Seen this morning prior to CTA abdomen. Denied abdominal pain, even after meal last night. No nausea or vomiting. BM 2 days ago without bleeding. As per RN, reported transient epigastric pain which resolved after a few minutes after taking own Sublingual NTG and as per patient, has had infrequent episodes of chest discomfort in the past.  ROS: No chest pain, dyspnea or palpitations.   Objective:  Vitals:   03/27/17 2054 03/28/17 0436 03/28/17 0700 03/28/17 1000  BP: (!) 119/98 (!) 178/71 (!) 166/49 (!) 124/49  Pulse: 67 77 66   Resp: 18 19 16 18   Temp: 98.6 F (37 C) 97.8 F (36.6 C) 98.2 F (36.8 C)   TempSrc: Oral Oral Oral   SpO2: 99%  96% 96%  Weight: 55.9 kg (123 lb 3.8 oz)     Height:        Examination:  General exam: Pleasant elderly female, moderately built and nourished, lying comfortably supine in bed. Stable. Respiratory system: Clear to auscultation. Respiratory effort normal. Stable without change Cardiovascular system: S1 & S2 heard, RRR. No JVD, murmurs, rubs, gallops or clicks. No pedal edema.Telemetry: Sinus rhythm. Stable without change Gastrointestinal system: Abdomen is nondistended, soft and nontender. No organomegaly or masses felt. Normal bowel sounds heard. Stable without change Central nervous system: Alert and oriented. No focal  neurological deficits. Extremities: Symmetric 5 x 5 power. Skin: No rashes, lesions or ulcers Psychiatry: Judgement and insight appear normal. Mood & affect: Slightly anxious.     Data Reviewed: I have personally reviewed following labs and imaging studies  CBC:  Recent Labs Lab 03/22/17 0602 03/23/17 0519 03/24/17 0518 03/25/17 0613 03/27/17 1315  WBC 8.6 5.3 6.1 6.4 4.7  NEUTROABS 7.2  --   --   --   --   HGB 13.5 10.8* 11.9* 11.3* 10.8*  HCT 42.8 33.9* 36.8 34.3* 33.5*  MCV 105.2* 105.0* 103.1* 101.2* 99.1  PLT 260 201 212 237 852   Basic Metabolic Panel:  Recent Labs Lab 03/24/17 0518 03/25/17 0613 03/26/17 0801 03/27/17 0241 03/27/17 1315  NA 133* 131* 132* 129* 128*  K 4.6 4.9 4.5 5.4* 4.5  CL 95* 94* 92* 95* 92*  CO2 26 27 27 25 27   GLUCOSE 73 103* 66 48* 122*  BUN 36* 22* 23* 30* 35*  CREATININE 8.15* 6.08* 5.89* 7.82* 8.66*  CALCIUM 7.9* 8.1* 8.5* 7.9* 7.5*  PHOS 5.2*  --  5.4* 5.5* 5.5*   Liver Function Tests:  Recent Labs Lab 03/22/17 0602 03/24/17 0518 03/26/17 0801 03/27/17 0241 03/27/17 1315  AST 19  --   --   --   --   ALT 14  --   --   --   --   ALKPHOS 104  --   --   --   --   BILITOT 0.5  --   --   --   --   PROT 7.4  --   --   --   --  ALBUMIN 4.0 3.1* 3.0* 2.9* 2.7*   Coagulation Profile:  Recent Labs Lab 03/28/17 0758  INR 0.98   Cardiac Enzymes: No results for input(s): CKTOTAL, CKMB, CKMBINDEX, TROPONINI in the last 168 hours. HbA1C: No results for input(s): HGBA1C in the last 72 hours. CBG:  Recent Labs Lab 03/27/17 1143 03/27/17 1640 03/27/17 2058 03/28/17 0745 03/28/17 1241  GLUCAP 175* 50* 131* 208* 147*    Recent Results (from the past 240 hour(s))  MRSA PCR Screening     Status: None   Collection Time: 03/22/17  8:51 AM  Result Value Ref Range Status   MRSA by PCR NEGATIVE NEGATIVE Final    Comment:        The GeneXpert MRSA Assay (FDA approved for NASAL specimens only), is one component of  a comprehensive MRSA colonization surveillance program. It is not intended to diagnose MRSA infection nor to guide or monitor treatment for MRSA infections.       EKG 9/18 personally reviewed: SR at 72 bpm, Incomplete LBBB, LVH with repolarization, Q waves & elevated J point in V1-V2, QTC 505 ? Accuracy d/t BBB. zdoes not look much different from EKG on 02/08/2017.   Radiology Studies: Ir US Guide Vasc Access Right  Result Date: 03/28/2017 INDICATION: 76 year old with abdominal pain and being evaluated for mesenteric ischemia. Patient needs an abdominal CTA but does not have adequate venous access for the CTA examination. Patient is end-stage renal disease and not a candidate for PICC line. Patient has a left upper arm fistula which is maturing and a right jugular dialysis catheter. EXAM: PLACEMENT OF PERIPHERAL IV WITH ULTRASOUND GUIDANCE MEDICATIONS: None ANESTHESIA/SEDATION: None FLUOROSCOPY TIME:  None). COMPLICATIONS: None immediate. PROCEDURE: Right upper arm was evaluated with ultrasound. A patent basilic vein was identified. Right upper arm was prepped and draped in a sterile fashion. Tourniquet was placed. Skin was anesthetized with 1% lidocaine. 21 gauge needle directed into the right basilic vein with ultrasound guidance. Micropuncture dilator set was placed. Catheter aspirated and flushed well. IMPRESSION: Successful placement of a peripheral IV in the right upper arm. This is intended to be a temporary access that will be removed after the CTA procedure. Electronically Signed   By: Markus Daft M.D.   On: 03/28/2017 13:35   Ir Radiology Peripheral Guided Iv Start  Result Date: 03/28/2017 INDICATION: 76 year old with abdominal pain and being evaluated for mesenteric ischemia. Patient needs an abdominal CTA but does not have adequate venous access for the CTA examination. Patient is end-stage renal disease and not a candidate for PICC line. Patient has a left upper arm fistula which is  maturing and a right jugular dialysis catheter. EXAM: PLACEMENT OF PERIPHERAL IV WITH ULTRASOUND GUIDANCE MEDICATIONS: None ANESTHESIA/SEDATION: None FLUOROSCOPY TIME:  None). COMPLICATIONS: None immediate. PROCEDURE: Right upper arm was evaluated with ultrasound. A patent basilic vein was identified. Right upper arm was prepped and draped in a sterile fashion. Tourniquet was placed. Skin was anesthetized with 1% lidocaine. 21 gauge needle directed into the right basilic vein with ultrasound guidance. Micropuncture dilator set was placed. Catheter aspirated and flushed well. IMPRESSION: Successful placement of a peripheral IV in the right upper arm. This is intended to be a temporary access that will be removed after the CTA procedure. Electronically Signed   By: Markus Daft M.D.   On: 03/28/2017 13:35   Ct Angio Abd/pel W/ And/or W/o  Result Date: 03/28/2017 CLINICAL DATA:  Mesenteric ischemia EXAM: CTA ABDOMEN AND PELVIS wITHOUT AND WITH CONTRAST TECHNIQUE:  Multidetector CT imaging of the abdomen and pelvis was performed using the standard protocol during bolus administration of intravenous contrast. Multiplanar reconstructed images and MIPs were obtained and reviewed to evaluate the vascular anatomy. CONTRAST:  80 cc Isovue 370 COMPARISON:  03/22/2017 FINDINGS: VASCULAR Aorta: There are extensive atherosclerotic calcifications throughout the aorta. There is little if any soft plaque visualized in the lower thorax or abdomen. It is nonaneurysmal. Celiac: There is 70-80% narrowing at the origin likely due with to a combination of atherosclerosis an median arcuate ligament syndrome. Branch vessels are patent. SMA: There is 50% narrowing at the origin due to atherosclerotic plaque and calcification. Beyond the origin, it is patent. Branch vessels are grossly patent. Renals: Renal arteries are severely diminutive an atrophic. IMA: There is moderate narrowing at the origin. Branch vessels are patent. Inflow: Mild  atherosclerotic calcifications in the right common and external iliac arteries without significant narrowing. Mild disease in the right internal iliac artery. Similarly, there is mild atherosclerotic calcification and some soft plaque in the left common iliac artery. The external iliac artery is pain. Internal iliac artery is patent. Proximal Outflow: The bilateral femoral arteries are grossly patent within the confines of the examination. There is some soft plaque in the right common femoral artery. There is also a small penetrating atherosclerotic ulcer. See table position -1017.3. Veins: Hepatic, portal, splenic, superior mesenteric, and bilateral renal veins are patent. IVC is patent. Bilateral common and external iliac veins are patent. Review of the MIP images confirms the above findings. NON-VASCULAR Lower chest: Dependent atelectasis bilaterally. Hepatobiliary: Subcentimeter hypodensity in the right lobe of the liver on image 20 is nonspecific. There is also a calcified granuloma in the central liver. Gallbladder is decompressed causing artificial wall thickening. Pancreas: Unremarkable Spleen: Unremarkable Adrenals/Urinary Tract: Severe atrophy of the kidneys is present. The appearance is unchanged compare to the prior study. There is a cyst in the medial mid left kidney. There is an enhancing heterogeneous mass in the upper pole of the right kidney which is stable. Bladder is decompressed. Adrenal glands are within normal limits. Stomach/Bowel: There is a 2.2 x 2.2 cm gas and fluid filled abscess in the left upper quadrant between small bowel loops. No evidence of small-bowel obstruction. Inflammatory changes in the adjacent small bowel are improved. No focal mass of the colon. Diverticulosis throughout the length of the colon is noted. There are postoperative changes in the location of the cecum. There are also postoperative changes at the sigmoid rectum junction. Lymphatic: No abnormal retroperitoneal  adenopathy. Reproductive: Uterus is absent.  Adnexa are unremarkable. Other: There is no free fluid in the pelvis. Musculoskeletal: No vertebral compression deformity. Renal osteodystrophy changes in the spine are present. IMPRESSION: VASCULAR Significant narrowing at the origin of the celiac axis. 50% narrowing at the origin of the SMA. Moderate narrowing at the origin of the IMA. NON-VASCULAR There is a 2.2 cm gas and fluid filled abscess within the left upper quadrant between small bowel loops. Stable right upper lobe renal mass. Please see prior dictation for recommendations. Tiny hypodensity in the liver is nonspecific but likely benign. If patient has a history of malignancy, consider six-month follow-up MRI. Electronically Signed   By: Marybelle Killings M.D.   On: 03/28/2017 12:17        Scheduled Meds: . amiodarone  200 mg Oral Daily  . aspirin EC  81 mg Oral Daily  . bisacodyl  10 mg Rectal q12n4p  . carvedilol  6.25 mg Oral BID  WC  . cinacalcet  30 mg Oral QPC supper  . doxercalciferol  2.5 mcg Intravenous Q M,W,F-HD  . famotidine  20 mg Oral QODAY  . [START ON 03/29/2017] heparin  5,000 Units Subcutaneous Q8H  . isosorbide mononitrate  120 mg Oral Daily  . lidocaine (PF)      . multivitamin  1 tablet Oral QHS  . sevelamer carbonate  800 mg Oral TID WC  . ticagrelor  90 mg Oral BID   Continuous Infusions: . sodium chloride    . sodium chloride    . sodium chloride    . sodium chloride    . sodium chloride    . sodium chloride    . dextrose 20 mL/hr at 03/27/17 1747  . ertapenem Stopped (03/28/17 0047)     LOS: 6 days     Altair Stanko, MD, FACP, FHM. [facility] Pager [phone]  If 7PM-7AM, please contact night-coverage [email or web address] Password [email or web address] 03/28/2017, 2:02 PM

## 2017-03-28 NOTE — Progress Notes (Signed)
Assessed PT for 20ga PIV for CT scan.  No suitable or large enough vein in AC; mid forearm or upper arm found.

## 2017-03-28 NOTE — Progress Notes (Signed)
Pt reports chest pain when coming back up from CT. States that pain was not sharp and lasted couple of minutes. Vitals stable. Denies other complaints. Dr. Algis Liming made aware and EKG taken.

## 2017-03-28 NOTE — Consult Note (Signed)
[facility] Surgery Consult Note  Shawna [name] 1941-03-07  [id number].    Requesting MD: Hongalgi Chief Complaint/Reason for Consult: Abdominal abscess  HPI:  Shawna [name] is a 76yo female PMH of ESRD, CAD/NSTEMI/PCI, chronic diastolic CHF, HTN, HLD, anemia of chronic disease, GI bleed due to small bowel angiodysplasia, who was transferred from [facility] 9/12 for acute onset lower abdominal pain. Patient states that her pain began 9/11. It was constant, sharp and severe and like nothing she had ever felt before. She had no associated symptoms, just lower abdominal pain. Dulcolax did not help therefore she went to the ED at [facility]. Patient was found to have focal enteritis-diverticulitis and question of contained perforation on CT scan. She continued to have abdominal pain despite bowel rest and antibiotics therefore an abdominal Doppler which showed more than 70% stenosis of all 3 mesenteric vessels at their origins. CTA today showed a 2.2 cm gas and fluid filled abscess within the left upper quadrant between small bowel loops. IR consulted for possible stent placement. They also looked at the intraabdominal abscess and were unable to drain. General surgery asked to consult regarding intraabdominal abscess.  -PMH significant for ESRD on HD, CAD/NSTEMI/PCI on Brillinta, chronic diastolic CHF, HTN, HLD, anemia of chronic disease -Abdominal surgical history: ex lap hysterectomy/partial colon resection/appendectomy for ovarian cancer 1992, tubal ligation -Anticoagulants: Brillinta -Nonsmoker -Lives home alone with husband, uses cane for ambulation  ROS: Review of Systems  Constitutional: Negative.   HENT: Negative.   Eyes: Negative.   Respiratory: Negative.   Cardiovascular: Negative.   Gastrointestinal: Positive for abdominal pain. Negative for blood in stool, constipation, diarrhea, melena, nausea and vomiting.  Genitourinary: Negative.   Musculoskeletal: Negative.    Skin: Negative.   Neurological: Negative.    All systems reviewed and otherwise negative except for as above  Family History  Problem Relation Age of Onset  . Heart disease Mother        Heart Disease before age 83  . Hyperlipidemia Mother   . Hypertension Mother   . Diabetes Mother   . Heart attack Mother   . Heart disease Father        Heart Disease before age 40  . Hyperlipidemia Father   . Hypertension Father   . Diabetes Father   . Diabetes Sister   . Hypertension Sister   . Diabetes Brother   . Hyperlipidemia Brother   . Heart attack Brother   . Hypertension Sister   . Heart attack Brother   . Other Unknown        noncontributory for early CAD  . Colon cancer Neg Hx   . Esophageal cancer Neg Hx   . Liver disease Neg Hx   . Kidney disease Neg Hx   . Colon polyps Neg Hx     Past Medical History:  Diagnosis Date  . Acute on chronic respiratory failure with hypoxia ([place]) 10/10/2016  . Anxiety   . Arthritis   . AVM (arteriovenous malformation) of colon   . CAD (coronary artery disease)    a. 12/2011 NSTEMI/Cath/PCI LCX (2.25x14 Resolute DES) & D1 (2.25x22 Resolute DES);  b. 01/2012 Cath/PCI: LM 30, LAD 30p, 40-44m D1 stent ok, 99 in sm branch of diag, LCX patent stent, OM1 20, RCA 95 ost (4.0x12 Promus DES), EF 55%;  c. 04/2012 Lexi Cardiolite  EF 48%, small area of scar @ base/mid inflat wall with mild peri-infarct ischemia.; CABG 12/4  . Carotid artery disease ([place]  a. 38-75% LICA, 12/4330   . Chronic bronchitis ([place])   . Chronic diastolic CHF (congestive heart failure) ([place])    a. 02/2012 Echo EF 60-65%, nl wall motion, Gr 1 DD, mod MR  . Colon cancer ([place]) 1992  . Esophageal stricture   . ESRD on hemodialysis ([place])    ESRD due to HTN, started dialysis 2011 and gets HD at [facility] with Dr Hinda Lenis on MWF schedule.  Access is LUA AVF as of Sept 2014.   Marland Kitchen GERD (gastroesophageal reflux disease)   . High cholesterol 12/2011  . History of blood transfusion  07/2011; 12/2011; 01/2012 X 2; 04/2012  . History of gout   . History of lower GI bleeding   . Hypertension   . Iron deficiency anemia   . Mitral regurgitation    a. Moderate by echo, 02/2012  . Myocardial infarction ([place])   . Ovarian cancer ([place]) 1992  . Pneumonia ~ 2009  . PUD (peptic ulcer disease)   . TIA (transient ischemic attack)     Past Surgical History:  Procedure Laterality Date  . ABDOMINAL HYSTERECTOMY  1992  . APPENDECTOMY  06/1990  . AV FISTULA PLACEMENT  07/2009   left upper arm  . AV FISTULA PLACEMENT Right 09/06/2016   Procedure: RIGHT FOREARM ARTERIOVENOUS (AV) GRAFT;  Surgeon: Elam Dutch, MD;  Location: [facility] OR;  Service: Vascular;  Laterality: Right;  . AV FISTULA PLACEMENT N/A 02/24/2017   Procedure: INSERTION OF ARTERIOVENOUS (AV) GORE-TEX GRAFT ARM (BRACHIAL AXILLARY);  Surgeon: Katha Cabal, MD;  Location: [facility] ORS;  Service: Vascular;  Laterality: N/A;  . [place] Right 09/06/2016   Procedure: REMOVAL OF Right Arm ARTERIOVENOUS GORETEX GRAFT and Vein Patch angioplasty of brachial artery;  Surgeon: Angelia Mould, MD;  Location: [place];  Service: Vascular;  Laterality: Right;  . COLON RESECTION  1992  . COLON SURGERY    . CORONARY ANGIOPLASTY WITH STENT PLACEMENT  12/15/11   "2"  . CORONARY ANGIOPLASTY WITH STENT PLACEMENT  y/2013   "1; makes total of 3" (05/02/2012)  . CORONARY ARTERY BYPASS GRAFT  06/13/2012   Procedure: CORONARY ARTERY BYPASS GRAFTING (CABG);  Surgeon: Grace Isaac, MD;  Location: [place];  Service: Open Heart Surgery;  Laterality: N/A;  cabg x four;  using left internal mammary artery, and left leg greater saphenous vein harvested endoscopically  . CORONARY STENT INTERVENTION N/A 10/13/2016   Procedure: Coronary Stent Intervention;  Surgeon: Troy Sine, MD;  Location: [place] CV LAB;  Service: Cardiovascular;  Laterality: N/A;  . DILATION AND CURETTAGE OF UTERUS    . ESOPHAGOGASTRODUODENOSCOPY  01/20/2012    Procedure: ESOPHAGOGASTRODUODENOSCOPY (EGD);  Surgeon: Ladene Artist, MD,FACG;  Location: [facility] ENDOSCOPY;  Service: Endoscopy;  Laterality: N/A;  . ESOPHAGOGASTRODUODENOSCOPY N/A 03/26/2013   Procedure: ESOPHAGOGASTRODUODENOSCOPY (EGD);  Surgeon: Irene Shipper, MD;  Location: [facility] ENDOSCOPY;  Service: Endoscopy;  Laterality: N/A;  . ESOPHAGOGASTRODUODENOSCOPY N/A 04/30/2015   Procedure: ESOPHAGOGASTRODUODENOSCOPY (EGD);  Surgeon: Rogene Houston, MD;  [facility];  Service: Endoscopy;  Laterality: N/A;  1pm - moved to 10/20 @ 1:10  . ESOPHAGOGASTRODUODENOSCOPY N/A 07/29/2016   Procedure: ESOPHAGOGASTRODUODENOSCOPY (EGD);  Surgeon: Manus Gunning, MD;  Location: [place];  Service: Gastroenterology;  Laterality: N/A;  enteroscopy  . INTRAOPERATIVE TRANSESOPHAGEAL ECHOCARDIOGRAM  06/13/2012   Procedure: INTRAOPERATIVE TRANSESOPHAGEAL ECHOCARDIOGRAM;  Surgeon: Grace Isaac, MD;  Location: [place];  Service: Open Heart Surgery;  Laterality: N/A;  . IR GENERIC HISTORICAL  07/26/2016   IR FLUORO GUIDE CV LINE RIGHT 07/26/2016 Greggory Keen, MD [facility]INTER[facility]  . IR GENERIC HISTORICAL  07/26/2016   IR US GUIDE VASC ACCESS RIGHT 07/26/2016 Greggory Keen, MD [facility]  . IR GENERIC HISTORICAL  08/02/2016   IR US GUIDE VASC ACCESS RIGHT 08/02/2016 Greggory Keen, MD [facility]  . IR GENERIC HISTORICAL  08/02/2016   IR FLUORO GUIDE CV LINE RIGHT 08/02/2016 Greggory Keen, MD [facility]  . IR RADIOLOGY PERIPHERAL GUIDED IV START  03/28/2017  . IR US GUIDE VASC ACCESS RIGHT  03/28/2017  . LEFT HEART CATH AND CORONARY ANGIOGRAPHY N/A 09/20/2016   Procedure: Left Heart Cath and Coronary Angiography;  Surgeon: [name], MD;  Location: [place] CV LAB;  Service: Cardiovascular;  Laterality: N/A;  . LEFT HEART CATH AND CORS/GRAFTS ANGIOGRAPHY N/A 10/13/2016   Procedure: Left Heart Cath and Cors/Grafts Angiography;  Surgeon: Troy Sine, MD;  Location: [place] CV LAB;  Service:  Cardiovascular;  Laterality: N/A;  . LEFT HEART CATHETERIZATION WITH CORONARY ANGIOGRAM N/A 12/15/2011   Procedure: LEFT HEART CATHETERIZATION WITH CORONARY ANGIOGRAM;  Surgeon: Burnell Blanks, MD;  Location: [facility] CATH LAB;  Service: Cardiovascular;  Laterality: N/A;  . LEFT HEART CATHETERIZATION WITH CORONARY ANGIOGRAM N/A 01/10/2012   Procedure: LEFT HEART CATHETERIZATION WITH CORONARY ANGIOGRAM;  Surgeon: Peter M Martinique, MD;  Location: [facility] CATH LAB;  Service: Cardiovascular;  Laterality: N/A;  . LEFT HEART CATHETERIZATION WITH CORONARY ANGIOGRAM N/A 06/08/2012   Procedure: LEFT HEART CATHETERIZATION WITH CORONARY ANGIOGRAM;  Surgeon: Burnell Blanks, MD;  Location: [facility] CATH LAB;  Service: Cardiovascular;  Laterality: N/A;  . LEFT HEART CATHETERIZATION WITH CORONARY/GRAFT ANGIOGRAM N/A 12/10/2013   Procedure: LEFT HEART CATHETERIZATION WITH Beatrix Fetters;  Surgeon: [name], MD;  Location: [facility] CATH LAB;  Service: Cardiovascular;  Laterality: N/A;  . OVARY SURGERY     ovarian cancer  . REVISION OF ARTERIOVENOUS GORETEX GRAFT N/A 02/24/2017   Procedure: REVISION OF ARTERIOVENOUS GORETEX GRAFT (RESECTION);  Surgeon: Katha Cabal, MD;  Location: [facility] ORS;  Service: Vascular;  Laterality: N/A;  Earney Mallet N/A 10/15/2013   Procedure: Fistulogram;  Surgeon: Serafina Mitchell, MD;  Location: [place] CATH LAB;  Service: Cardiovascular;  Laterality: N/A;  . THROMBECTOMY / ARTERIOVENOUS GRAFT REVISION  2011   left upper arm  . TUBAL LIGATION  1980's  . UPPER EXTREMITY ANGIOGRAPHY Bilateral 12/06/2016   Procedure: Upper Extremity Angiography;  Surgeon: Katha Cabal, MD;  Location: [place] CV LAB;  Service: Cardiovascular;  Laterality: Bilateral;    Social History:  reports that she has never smoked. She has never used smokeless tobacco. She reports that she does not drink alcohol or use drugs.  Allergies:  Allergies  Allergen Reactions  . Aspirin Other (See  Comments)    High Doses Mess up her stomach; "makes my bowels have blood in them". Takes 81 mg EC Aspirin   . Penicillins Other (See Comments)    SYNCOPE? , "makes me real weak when I take it; like I'll pass out"  Has patient had a PCN reaction causing immediate rash, facial/tongue/throat swelling, SOB or lightheadedness with hypotension: Yes Has patient had a PCN reaction causing severe rash involving mucus membranes or skin necrosis: no Has patient had a PCN reaction that required hospitalization no Has patient had a PCN reaction occurring within the last 10 years: no If all of the above  . Amlodipine Swelling  . Bactrim [Sulfamethoxazole-Trimethoprim] Rash  . Contrast  Media [Iodinated Diagnostic Agents] Itching  . Iron Itching and Other (See Comments)    "they gave me iron in dialysis; had to give me Benadryl cause I had to have the iron" (05/02/2012)  . Nitrofurantoin Hives  . Tylenol [Acetaminophen] Itching and Other (See Comments)    Makes her feet on fire per pt  . Gabapentin Other (See Comments)    unspecified  . Dexilant [Dexlansoprazole] Other (See Comments)    Upset stomach  . Levaquin [Levofloxacin In D5w] Rash  . Morphine And Related Itching    Itching in feet  . Plavix [Clopidogrel Bisulfate] Rash  . Protonix [Pantoprazole Sodium] Rash  . Venofer [Ferric Oxide] Itching    Patient reports using Benadryl prior to doses as [place]    Medications Prior to Admission  Medication Sig Dispense Refill  . ALPRAZolam (XANAX) 0.25 MG tablet Take 0.25 mg by mouth 2 (two) times daily as needed for anxiety or sleep.     Marland Kitchen amiodarone (PACERONE) 200 MG tablet Take 1 tablet (200 mg total) by mouth daily. 30 tablet 3  . aspirin EC 81 MG tablet Take 81 mg by mouth daily.     . carvedilol (COREG) 6.25 MG tablet Take 1 tablet (6.25 mg total) by mouth 2 (two) times daily with a meal. 180 tablet 1  . cinacalcet (SENSIPAR) 30 MG tablet Take 30 mg by mouth daily after supper.    .  cloNIDine (CATAPRES) 0.2 MG tablet Take 1 tablet (0.2 mg total) by mouth 3 (three) times daily. (Patient taking differently: Take 0.2 mg by mouth 2 (two) times daily as needed (high blood pressure). ) 90 tablet 3  . diphenhydrAMINE (BENADRYL) 25 mg capsule Take 50 mg by mouth daily as needed for itching or allergies.     Marland Kitchen epoetin alfa (EPOGEN,PROCRIT) 72094 UNIT/ML injection Inject 1 mL (10,000 Units total) into the vein every Monday, Wednesday, and Friday with hemodialysis. 1 mL 10  . fluticasone (FLONASE) 50 MCG/ACT nasal spray Place 2 sprays into the nose at bedtime as needed for allergies.     . isosorbide mononitrate (IMDUR) 120 MG 24 hr tablet Take 1 tablet (120 mg total) by mouth daily. 30 tablet 2  . multivitamin (RENA-VIT) TABS tablet Take 1 tablet by mouth daily.    . nitroGLYCERIN (NITROSTAT) 0.4 MG SL tablet Place 1 tablet (0.4 mg total) under the tongue every 5 (five) minutes as needed for chest pain. 30 tablet 1  . omeprazole (PRILOSEC) 20 MG capsule Take 20 mg by mouth daily.     . ondansetron (ZOFRAN-ODT) 4 MG disintegrating tablet Take 4 mg by mouth 2 (two) times daily as needed for nausea or vomiting.    Marland Kitchen oxycodone (OXY-IR) 5 MG capsule Take 1 capsule (5 mg total) by mouth every 6 (six) hours as needed for pain. 50 capsule 0  . sevelamer carbonate (RENVELA) 800 MG tablet Take 800 mg by mouth 3 (three) times daily with meals.    . simvastatin (ZOCOR) 20 MG tablet TAKE ONE TABLET BY MOUTH AT BEDTIME 90 tablet 3  . ticagrelor (BRILINTA) 90 MG TABS tablet Take 90 mg by mouth 2 (two) times daily.    . hydrALAZINE (APRESOLINE) 50 MG tablet Take 2 tablets (100 mg total) by mouth 3 (three) times daily. (Patient taking differently: Take 100 mg by mouth 2 (two) times daily as needed (for high blood pressure readings). ) 540 tablet 3    Prior to Admission medications   Medication Sig  Start Date End Date Taking? Authorizing Provider  ALPRAZolam (XANAX) 0.25 MG tablet Take 0.25 mg by mouth 2  (two) times daily as needed for anxiety or sleep.  11/19/15  Yes [provider]  amiodarone (PACERONE) 200 MG tablet Take 1 tablet (200 mg total) by mouth daily. 12/08/16  Yes Herminio Commons, MD  aspirin EC 81 MG tablet Take 81 mg by mouth daily.    Yes [provider]  carvedilol (COREG) 6.25 MG tablet Take 1 tablet (6.25 mg total) by mouth 2 (two) times daily with a meal. 11/16/16  Yes Herminio Commons, MD  cinacalcet (SENSIPAR) 30 MG tablet Take 30 mg by mouth daily after supper.   Yes [provider]  cloNIDine (CATAPRES) 0.2 MG tablet Take 1 tablet (0.2 mg total) by mouth 3 (three) times daily. Patient taking differently: Take 0.2 mg by mouth 2 (two) times daily as needed (high blood pressure).  12/08/16  Yes Herminio Commons, MD  diphenhydrAMINE (BENADRYL) 25 mg capsule Take 50 mg by mouth daily as needed for itching or allergies.    Yes [provider]  epoetin alfa (EPOGEN,PROCRIT) 72536 UNIT/ML injection Inject 1 mL (10,000 Units total) into the vein every Monday, Wednesday, and Friday with hemodialysis. 04/11/16  Yes Orvan Falconer, MD  fluticasone (FLONASE) 50 MCG/ACT nasal spray Place 2 sprays into the nose at bedtime as needed for allergies.    Yes [provider]  isosorbide mononitrate (IMDUR) 120 MG 24 hr tablet Take 1 tablet (120 mg total) by mouth daily. 01/21/17  Yes Caren Griffins, MD  multivitamin (RENA-VIT) TABS tablet Take 1 tablet by mouth daily.   Yes [provider]  nitroGLYCERIN (NITROSTAT) 0.4 MG SL tablet Place 1 tablet (0.4 mg total) under the tongue every 5 (five) minutes as needed for chest pain. 08/02/16  Yes Buriev, Arie Sabina, MD  omeprazole (PRILOSEC) 20 MG capsule Take 20 mg by mouth daily.  09/12/16  Yes [provider]  ondansetron (ZOFRAN-ODT) 4 MG disintegrating tablet Take 4 mg by mouth 2 (two) times daily as needed for nausea or vomiting.   Yes [provider]  oxycodone (OXY-IR) 5 MG  capsule Take 1 capsule (5 mg total) by mouth every 6 (six) hours as needed for pain. 02/24/17  Yes Schnier, Dolores Lory, MD  sevelamer carbonate (RENVELA) 800 MG tablet Take 800 mg by mouth 3 (three) times daily with meals.   Yes [provider]  simvastatin (ZOCOR) 20 MG tablet TAKE ONE TABLET BY MOUTH AT BEDTIME 02/13/17  Yes Herminio Commons, MD  ticagrelor (BRILINTA) 90 MG TABS tablet Take 90 mg by mouth 2 (two) times daily.   Yes [provider]  hydrALAZINE (APRESOLINE) 50 MG tablet Take 2 tablets (100 mg total) by mouth 3 (three) times daily. Patient taking differently: Take 100 mg by mouth 2 (two) times daily as needed (for high blood pressure readings).  11/10/16 02/20/17  Herminio Commons, MD    Blood pressure (!) 124/49, pulse 66, temperature 98.2 F (36.8 C), temperature source Oral, resp. rate 18, height 5' 2" (1.575 m), weight 123 lb 3.8 oz (55.9 kg), SpO2 96 %. Physical Exam: General: pleasant, frail AA female who is laying in bed in NAD HEENT: head is normocephalic, atraumatic.  Sclera are noninjected.  Pupils equal and round.  Ears and nose without any masses or lesions.  Mouth is pink and moist. Dentition fair Heart: regular, rate, and rhythm.  No obvious murmurs, gallops,  or rubs noted.  Palpable pedal pulses bilaterally Lungs: CTAB, no wheezes, rhonchi, or rales noted.  Respiratory effort nonlabored Abd: well healed midline incision, soft, ND, +BS, no masses, hernias, or organomegaly. Mild LLQ and RUQ TTP with no rebound or guarding MS: all 4 extremities are symmetrical with no cyanosis, clubbing, or edema; mild muscle wasting BLE Skin: warm and dry with no masses, lesions, or rashes Psych: A&Ox3 with an appropriate affect. Neuro: cranial nerves grossly intact, extremity CSM intact bilaterally, normal speech  Results for orders placed or performed during the hospital encounter of 03/22/17 (from the past 48 hour(s))  Renal function panel     Status: Abnormal    Collection Time: 03/27/17  2:41 AM  Result Value Ref Range   Sodium 129 (L) 135 - 145 mmol/L   Potassium 5.4 (H) 3.5 - 5.1 mmol/L   Chloride 95 (L) 101 - 111 mmol/L   CO2 25 22 - 32 mmol/L   Glucose, Bld 48 (L) 65 - 99 mg/dL   BUN 30 (H) 6 - 20 mg/dL   Creatinine, Ser 7.82 (H) 0.44 - 1.00 mg/dL   Calcium 7.9 (L) 8.9 - 10.3 mg/dL   Phosphorus 5.5 (H) 2.5 - 4.6 mg/dL   Albumin 2.9 (L) 3.5 - 5.0 g/dL   GFR calc non Af Amer 4 (L) >60 mL/min   GFR calc Af Amer 5 (L) >60 mL/min    Comment: (NOTE) The eGFR has been calculated using the CKD EPI equation. This calculation has not been validated in all clinical situations. eGFR's persistently <60 mL/min signify possible Chronic Kidney Disease.    Anion gap 9 5 - 15  Glucose, capillary     Status: Abnormal   Collection Time: 03/27/17  8:22 AM  Result Value Ref Range   Glucose-Capillary 44 (LL) 65 - 99 mg/dL  Glucose, capillary     Status: Abnormal   Collection Time: 03/27/17  8:54 AM  Result Value Ref Range   Glucose-Capillary 193 (H) 65 - 99 mg/dL  Glucose, capillary     Status: Abnormal   Collection Time: 03/27/17 10:59 AM  Result Value Ref Range   Glucose-Capillary 57 (L) 65 - 99 mg/dL  Glucose, capillary     Status: Abnormal   Collection Time: 03/27/17 11:43 AM  Result Value Ref Range   Glucose-Capillary 175 (H) 65 - 99 mg/dL  CBC     Status: Abnormal   Collection Time: 03/27/17  1:15 PM  Result Value Ref Range   WBC 4.7 4.0 - 10.5 K/uL   RBC 3.38 (L) 3.87 - 5.11 MIL/uL   Hemoglobin 10.8 (L) 12.0 - 15.0 g/dL   HCT 33.5 (L) 36.0 - 46.0 %   MCV 99.1 78.0 - 100.0 fL   MCH 32.0 26.0 - 34.0 pg   MCHC 32.2 30.0 - 36.0 g/dL   RDW 16.5 (H) 11.5 - 15.5 %   Platelets 213 150 - 400 K/uL  Renal function panel     Status: Abnormal   Collection Time: 03/27/17  1:15 PM  Result Value Ref Range   Sodium 128 (L) 135 - 145 mmol/L   Potassium 4.5 3.5 - 5.1 mmol/L   Chloride 92 (L) 101 - 111 mmol/L   CO2 27 22 - 32 mmol/L   Glucose,  Bld 122 (H) 65 - 99 mg/dL   BUN 35 (H) 6 - 20 mg/dL   Creatinine, Ser 8.66 (H) 0.44 - 1.00 mg/dL   Calcium 7.5 (L) 8.9 - 10.3 mg/dL  Phosphorus 5.5 (H) 2.5 - 4.6 mg/dL   Albumin 2.7 (L) 3.5 - 5.0 g/dL   GFR calc non Af Amer 4 (L) >60 mL/min   GFR calc Af Amer 5 (L) >60 mL/min    Comment: (NOTE) The eGFR has been calculated using the CKD EPI equation. This calculation has not been validated in all clinical situations. eGFR's persistently <60 mL/min signify possible Chronic Kidney Disease.    Anion gap 9 5 - 15  Glucose, capillary     Status: Abnormal   Collection Time: 03/27/17  4:40 PM  Result Value Ref Range   Glucose-Capillary 50 (L) 65 - 99 mg/dL  Glucose, capillary     Status: Abnormal   Collection Time: 03/27/17  8:58 PM  Result Value Ref Range   Glucose-Capillary 131 (H) 65 - 99 mg/dL  Glucose, capillary     Status: Abnormal   Collection Time: 03/28/17  7:45 AM  Result Value Ref Range   Glucose-Capillary 208 (H) 65 - 99 mg/dL  Protime-INR     Status: None   Collection Time: 03/28/17  7:58 AM  Result Value Ref Range   Prothrombin Time 12.9 11.4 - 15.2 seconds   INR 0.98   APTT     Status: None   Collection Time: 03/28/17  7:58 AM  Result Value Ref Range   aPTT 29 24 - 36 seconds  Glucose, capillary     Status: Abnormal   Collection Time: 03/28/17 12:41 PM  Result Value Ref Range   Glucose-Capillary 147 (H) 65 - 99 mg/dL   Ir US Guide Vasc Access Right  Result Date: 03/28/2017 INDICATION: 76 year old with abdominal pain and being evaluated for mesenteric ischemia. Patient needs an abdominal CTA but does not have adequate venous access for the CTA examination. Patient is end-stage renal disease and not a candidate for PICC line. Patient has a left upper arm fistula which is maturing and a right jugular dialysis catheter. EXAM: PLACEMENT OF PERIPHERAL IV WITH ULTRASOUND GUIDANCE MEDICATIONS: None ANESTHESIA/SEDATION: None FLUOROSCOPY TIME:  None). COMPLICATIONS: None  immediate. PROCEDURE: Right upper arm was evaluated with ultrasound. A patent basilic vein was identified. Right upper arm was prepped and draped in a sterile fashion. Tourniquet was placed. Skin was anesthetized with 1% lidocaine. 21 gauge needle directed into the right basilic vein with ultrasound guidance. Micropuncture dilator set was placed. Catheter aspirated and flushed well. IMPRESSION: Successful placement of a peripheral IV in the right upper arm. This is intended to be a temporary access that will be removed after the CTA procedure. Electronically Signed   By: Markus Daft M.D.   On: 03/28/2017 13:35   Ir Radiology Peripheral Guided Iv Start  Result Date: 03/28/2017 INDICATION: 76 year old with abdominal pain and being evaluated for mesenteric ischemia. Patient needs an abdominal CTA but does not have adequate venous access for the CTA examination. Patient is end-stage renal disease and not a candidate for PICC line. Patient has a left upper arm fistula which is maturing and a right jugular dialysis catheter. EXAM: PLACEMENT OF PERIPHERAL IV WITH ULTRASOUND GUIDANCE MEDICATIONS: None ANESTHESIA/SEDATION: None FLUOROSCOPY TIME:  None). COMPLICATIONS: None immediate. PROCEDURE: Right upper arm was evaluated with ultrasound. A patent basilic vein was identified. Right upper arm was prepped and draped in a sterile fashion. Tourniquet was placed. Skin was anesthetized with 1% lidocaine. 21 gauge needle directed into the right basilic vein with ultrasound guidance. Micropuncture dilator set was placed. Catheter aspirated and flushed well. IMPRESSION: Successful placement of a peripheral  IV in the right upper arm. This is intended to be a temporary access that will be removed after the CTA procedure. Electronically Signed   By: Markus Daft M.D.   On: 03/28/2017 13:35   Ct Angio Abd/pel W/ And/or W/o  Result Date: 03/28/2017 CLINICAL DATA:  Mesenteric ischemia EXAM: CTA ABDOMEN AND PELVIS wITHOUT AND WITH  CONTRAST TECHNIQUE: Multidetector CT imaging of the abdomen and pelvis was performed using the standard protocol during bolus administration of intravenous contrast. Multiplanar reconstructed images and MIPs were obtained and reviewed to evaluate the vascular anatomy. CONTRAST:  80 cc Isovue 370 COMPARISON:  03/22/2017 FINDINGS: VASCULAR Aorta: There are extensive atherosclerotic calcifications throughout the aorta. There is little if any soft plaque visualized in the lower thorax or abdomen. It is nonaneurysmal. Celiac: There is 70-80% narrowing at the origin likely due with to a combination of atherosclerosis an median arcuate ligament syndrome. Branch vessels are patent. SMA: There is 50% narrowing at the origin due to atherosclerotic plaque and calcification. Beyond the origin, it is patent. Branch vessels are grossly patent. Renals: Renal arteries are severely diminutive an atrophic. IMA: There is moderate narrowing at the origin. Branch vessels are patent. Inflow: Mild atherosclerotic calcifications in the right common and external iliac arteries without significant narrowing. Mild disease in the right internal iliac artery. Similarly, there is mild atherosclerotic calcification and some soft plaque in the left common iliac artery. The external iliac artery is pain. Internal iliac artery is patent. Proximal Outflow: The bilateral femoral arteries are grossly patent within the confines of the examination. There is some soft plaque in the right common femoral artery. There is also a small penetrating atherosclerotic ulcer. See table position -1017.3. Veins: Hepatic, portal, splenic, superior mesenteric, and bilateral renal veins are patent. IVC is patent. Bilateral common and external iliac veins are patent. Review of the MIP images confirms the above findings. NON-VASCULAR Lower chest: Dependent atelectasis bilaterally. Hepatobiliary: Subcentimeter hypodensity in the right lobe of the liver on image 20 is  nonspecific. There is also a calcified granuloma in the central liver. Gallbladder is decompressed causing artificial wall thickening. Pancreas: Unremarkable Spleen: Unremarkable Adrenals/Urinary Tract: Severe atrophy of the kidneys is present. The appearance is unchanged compare to the prior study. There is a cyst in the medial mid left kidney. There is an enhancing heterogeneous mass in the upper pole of the right kidney which is stable. Bladder is decompressed. Adrenal glands are within normal limits. Stomach/Bowel: There is a 2.2 x 2.2 cm gas and fluid filled abscess in the left upper quadrant between small bowel loops. No evidence of small-bowel obstruction. Inflammatory changes in the adjacent small bowel are improved. No focal mass of the colon. Diverticulosis throughout the length of the colon is noted. There are postoperative changes in the location of the cecum. There are also postoperative changes at the sigmoid rectum junction. Lymphatic: No abnormal retroperitoneal adenopathy. Reproductive: Uterus is absent.  Adnexa are unremarkable. Other: There is no free fluid in the pelvis. Musculoskeletal: No vertebral compression deformity. Renal osteodystrophy changes in the spine are present. IMPRESSION: VASCULAR Significant narrowing at the origin of the celiac axis. 50% narrowing at the origin of the SMA. Moderate narrowing at the origin of the IMA. NON-VASCULAR There is a 2.2 cm gas and fluid filled abscess within the left upper quadrant between small bowel loops. Stable right upper lobe renal mass. Please see prior dictation for recommendations. Tiny hypodensity in the liver is nonspecific but likely benign. If patient has  a history of malignancy, consider six-month follow-up MRI. Electronically Signed   By: Marybelle Killings M.D.   On: 03/28/2017 12:17   Anti-infectives    Start     Dose/Rate Route Frequency Ordered Stop   03/27/17 2200  ertapenem (INVANZ) 0.5 g in sodium chloride 0.9 % 50 mL IVPB     500  mg 100 mL/hr over 30 Minutes Intravenous Every 24 hours 03/27/17 0935 03/30/17 2359   03/26/17 1200  ertapenem (INVANZ) 1 g in sodium chloride 0.9 % 50 mL IVPB  Status:  Discontinued     1 g 100 mL/hr over 30 Minutes Intravenous Every T-Th-Sa (Hemodialysis) 03/26/17 1151 03/27/17 1152   03/24/17 1200  ertapenem (INVANZ) 1 g in sodium chloride 0.9 % 50 mL IVPB  Status:  Discontinued     1 g 100 mL/hr over 30 Minutes Intravenous Every M-W-F (Hemodialysis) 03/23/17 0830 03/26/17 1151   03/22/17 0745  ertapenem (INVANZ) 1 g in sodium chloride 0.9 % 50 mL IVPB  Status:  Discontinued     1 g 100 mL/hr over 30 Minutes Intravenous  Once 03/22/17 0735 03/27/17 0935   03/22/17 0715  clindamycin (CLEOCIN) IVPB 600 mg  Status:  Discontinued     600 mg 100 mL/hr over 30 Minutes Intravenous  Once 03/22/17 0700 03/22/17 0748   03/22/17 0700  metroNIDAZOLE (FLAGYL) IVPB 500 mg  Status:  Discontinued     500 mg 100 mL/hr over 60 Minutes Intravenous  Once 03/22/17 0657 03/22/17 0658   03/22/17 0700  clindamycin (CLEOCIN) IVPB 600 mg  Status:  Discontinued     600 mg 100 mL/hr over 30 Minutes Intravenous  Once 03/22/17 0658 03/22/17 0700        Assessment/Plan ESRD on HD CAD/NSTEMI/PCI on Brillinta chronic diastolic CHF HTN HLD Anemia of chronic disease  Abdominal pain suspected due to mesenteric ischemia & small bowel enteritis/Abscess - CT 9/12 showed short segment of inflamed small bowel surrounding a small focus of air extending beyond the bowel wall which may represent a contained perforation or small bowel diverticulitis. Trace ascites is likely reactive - U/s abdominal pelvic art/vent flow 9/16 showed elevated velocities at the origin of the celiac artery, superior mesenteric artery, inferior mesenteric artery, compatible with greater than 70% stenosis at all 3 mesenteric vessels origin - CT angio 9/18 showed 50% narrowing at the origin of the SMA, Moderate narrowing at the origin of the IMA,  and a 2.2 cm gas and fluid filled abscess within the left upper quadrant between small bowel loops  ID - invanz 9/12>>day#7 VTE - SCDs, heparin FEN - IVF, renal diet Foley - none Follow up - TBD  Plan - Patient found to have a small 2.2cm intraabdominal abscess likely secondary to mesenteric ischemia. The patient currently has a normal WBC and she is afebrile, VSS. Would not recommend any surgical intervention at this time. Recommend continuing bowel rest and IV antibiotics. Could consider repeating CT scan in the future to evaluate for resolution of abscess. Will continue to follow.  Wellington Hampshire, [facility] Surgery 03/28/2017, 2:34 PM Pager: [phone] Consults: [phone] Mon-Fri 7:00 am-4:30 pm Sat-Sun 7:00 am-11:30 am

## 2017-03-28 NOTE — Procedures (Signed)
Patient needs venous access for a CTA examination.  Patient is ESRD and has limited IV access.  A temporary right basilic IV was placed with US guidance.  This access can be used for CTA and then it will be removed.  IV access was placed without complication and no blood loss.

## 2017-03-28 NOTE — Progress Notes (Signed)
Per CT unable to procedure due patient is out of her window and needed a large bore IV in antecubital. Will consult IV team.

## 2017-03-29 ENCOUNTER — Inpatient Hospital Stay (HOSPITAL_COMMUNITY): Payer: Medicare HMO

## 2017-03-29 DIAGNOSIS — I208 Other forms of angina pectoris: Secondary | ICD-10-CM

## 2017-03-29 DIAGNOSIS — R079 Chest pain, unspecified: Secondary | ICD-10-CM

## 2017-03-29 LAB — CBC
HCT: 28.1 % — ABNORMAL LOW (ref 36.0–46.0)
Hemoglobin: 9.1 g/dL — ABNORMAL LOW (ref 12.0–15.0)
MCH: 32.2 pg (ref 26.0–34.0)
MCHC: 32.4 g/dL (ref 30.0–36.0)
MCV: 99.3 fL (ref 78.0–100.0)
Platelets: 182 10*3/uL (ref 150–400)
RBC: 2.83 MIL/uL — ABNORMAL LOW (ref 3.87–5.11)
RDW: 17 % — ABNORMAL HIGH (ref 11.5–15.5)
WBC: 9 10*3/uL (ref 4.0–10.5)

## 2017-03-29 LAB — RENAL FUNCTION PANEL
Albumin: 2.6 g/dL — ABNORMAL LOW (ref 3.5–5.0)
Anion gap: 17 — ABNORMAL HIGH (ref 5–15)
BUN: 37 mg/dL — ABNORMAL HIGH (ref 6–20)
CO2: 20 mmol/L — ABNORMAL LOW (ref 22–32)
Calcium: 7.2 mg/dL — ABNORMAL LOW (ref 8.9–10.3)
Chloride: 93 mmol/L — ABNORMAL LOW (ref 101–111)
Creatinine, Ser: 7.61 mg/dL — ABNORMAL HIGH (ref 0.44–1.00)
GFR calc Af Amer: 5 mL/min — ABNORMAL LOW (ref >60)
GFR calc non Af Amer: 5 mL/min — ABNORMAL LOW (ref >60)
Glucose, Bld: 115 mg/dL — ABNORMAL HIGH (ref 65–99)
Phosphorus: 4.3 mg/dL (ref 2.5–4.6)
Potassium: 4.2 mmol/L (ref 3.5–5.1)
Sodium: 130 mmol/L — ABNORMAL LOW (ref 135–145)

## 2017-03-29 LAB — GLUCOSE, CAPILLARY
GLUCOSE-CAPILLARY: 94 mg/dL (ref 65–99)
GLUCOSE-CAPILLARY: 64 mg/dL — AB (ref 65–99)
Glucose-Capillary: 117 mg/dL — ABNORMAL HIGH (ref 65–99)
Glucose-Capillary: 75 mg/dL (ref 65–99)
Glucose-Capillary: 63 mg/dL — ABNORMAL LOW (ref 65–99)
Glucose-Capillary: 173 mg/dL — ABNORMAL HIGH (ref 65–99)

## 2017-03-29 IMAGING — CR DG CHEST 2V
2 series · 2 of 2 positions shown · non-contrast
Comparison: [DATE].

CLINICAL DATA: Shortness of breath with cough.

EXAM:
CHEST  2 VIEW

[chest lat]
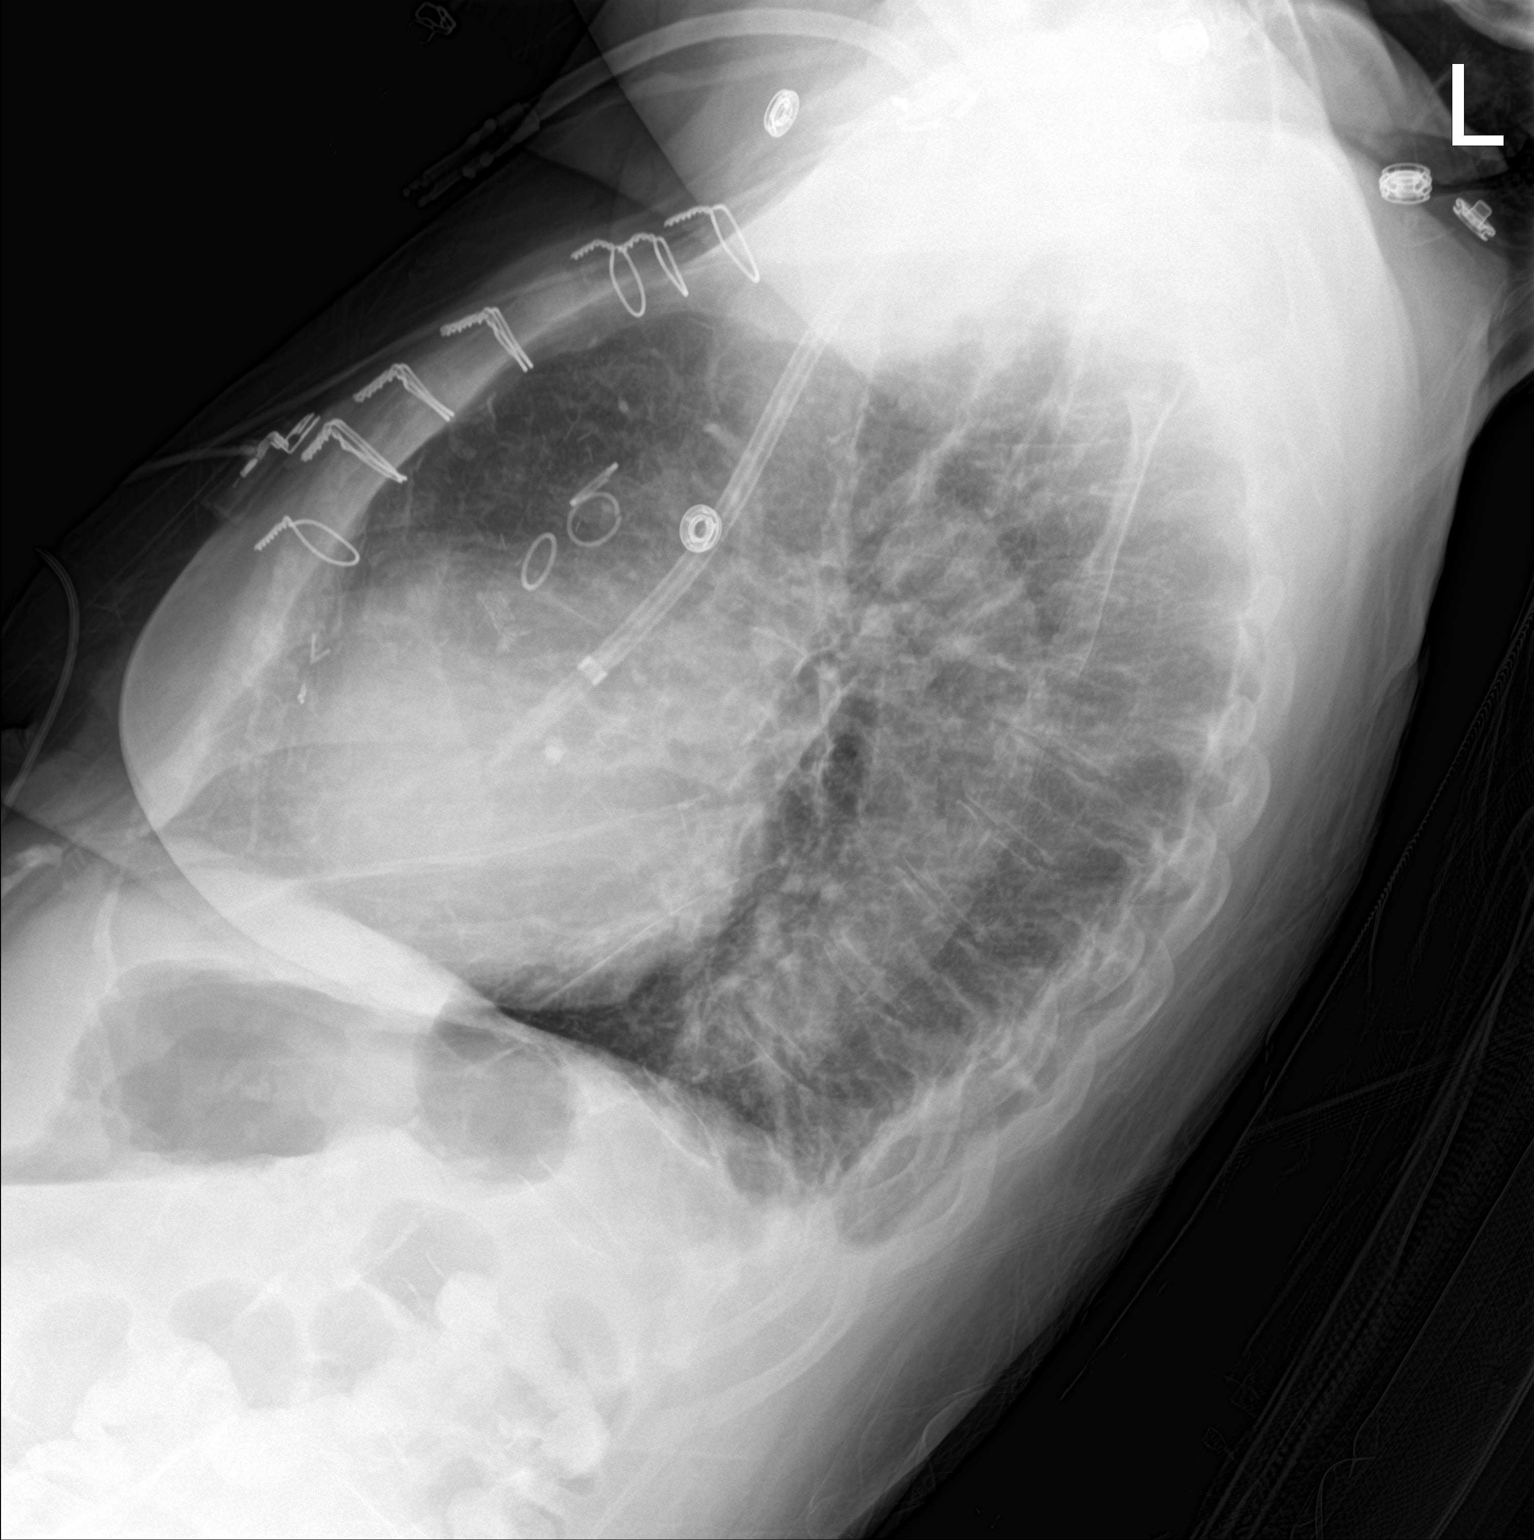

[chest ap]
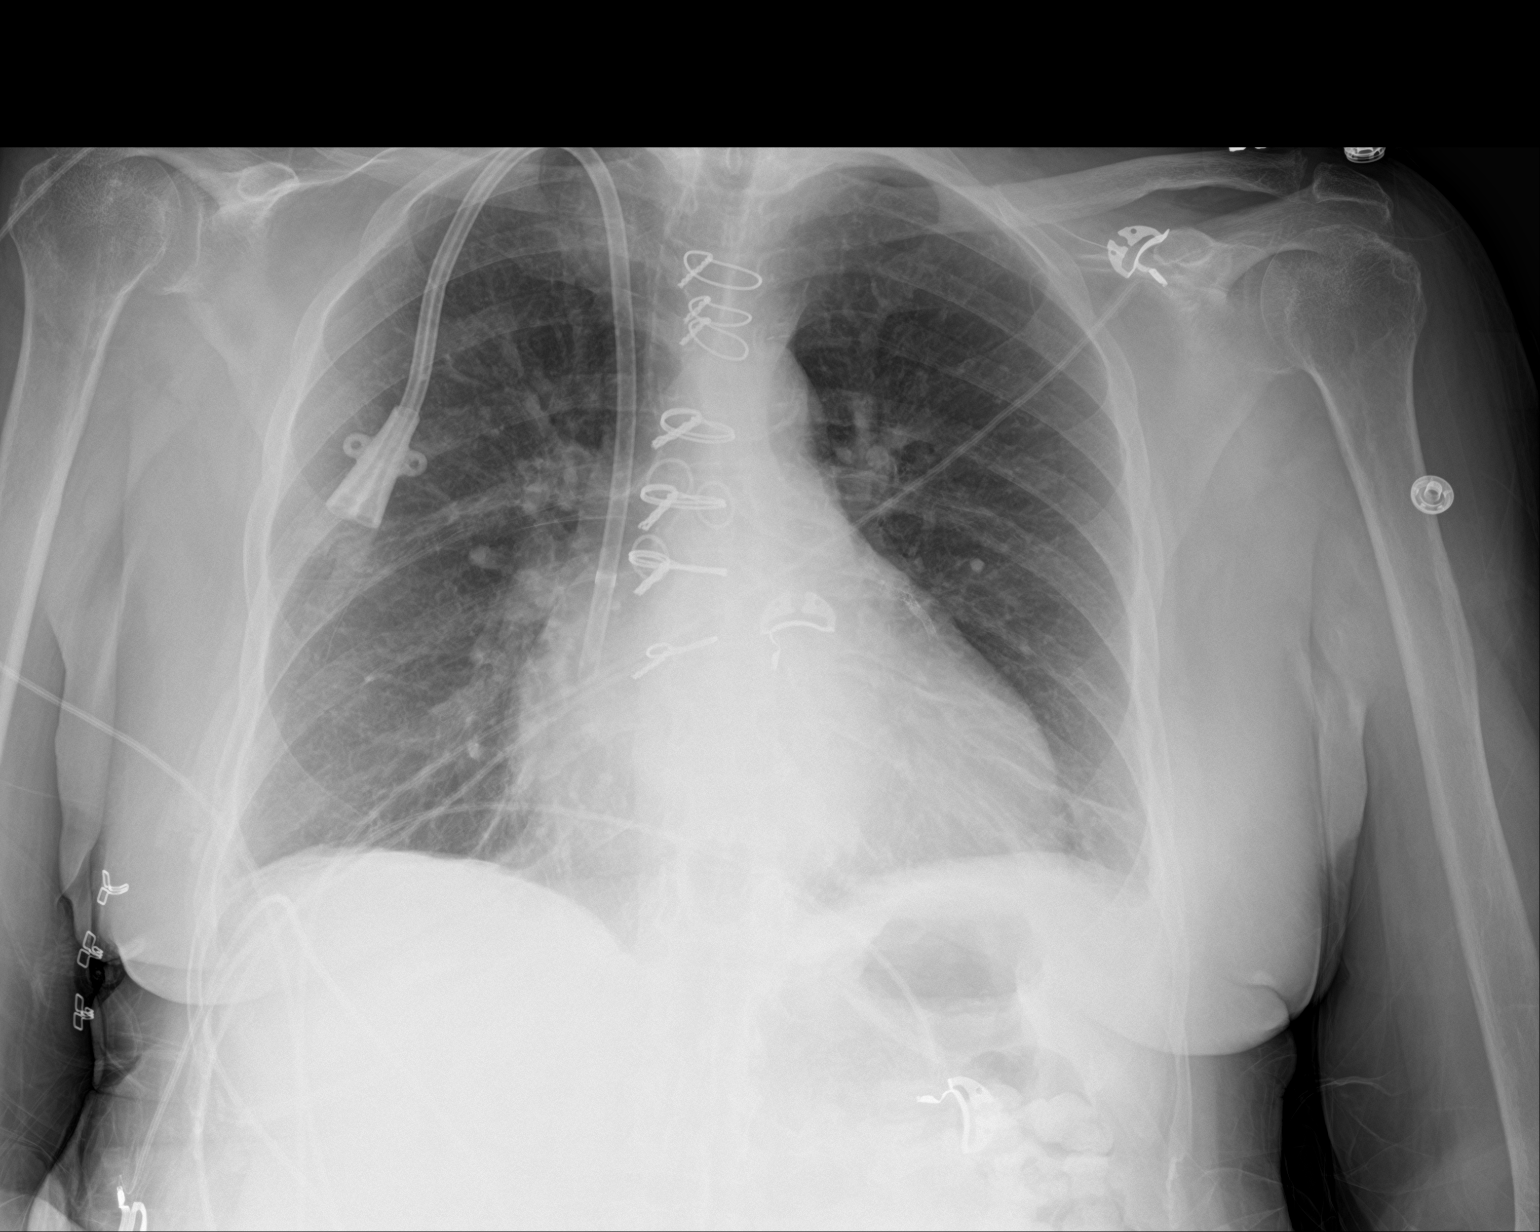

[2 of 2 positions shown; findings below may reference images not displayed]

FINDINGS: The heart is enlarged. There is coronary artery calcification and
thoracic atherosclerosis. There is no consolidation or edema. Dual
lumen catheter unchanged, tips proximal RIGHT atrium.
IMPRESSION: Stable chest.

## 2017-03-29 MED ORDER — LIDOCAINE HCL (PF) 1 % IJ SOLN: 5.0000 mL | INTRAMUSCULAR | Status: DC | PRN

## 2017-03-29 MED ORDER — LIDOCAINE-PRILOCAINE 2.5-2.5 % EX CREA: 1.0000 "application " | TOPICAL_CREAM | CUTANEOUS | Status: DC | PRN

## 2017-03-29 MED ORDER — ALTEPLASE 2 MG IJ SOLR: 2.0000 mg | Freq: Once | INTRAMUSCULAR | Status: DC | PRN

## 2017-03-29 MED ORDER — SODIUM CHLORIDE 0.9 % IV SOLN: 100.0000 mL | INTRAVENOUS | Status: DC | PRN

## 2017-03-29 MED ORDER — LIDOCAINE-PRILOCAINE 2.5-2.5 % EX CREA
TOPICAL_CREAM | CUTANEOUS | Status: DC
  Administered 2017-03-29: 14:00:00 via TOPICAL
  Filled 2017-03-29: qty 5

## 2017-03-29 MED ORDER — DEXTROSE 50 % IV SOLN
INTRAVENOUS | Status: AC
  Administered 2017-03-29: 25 mL
  Filled 2017-03-29: qty 50

## 2017-03-29 MED ORDER — PENTAFLUOROPROP-TETRAFLUOROETH EX AERO: 1.0000 "application " | INHALATION_SPRAY | CUTANEOUS | Status: DC | PRN

## 2017-03-29 MED ORDER — HEPARIN SODIUM (PORCINE) 1000 UNIT/ML DIALYSIS: 20.0000 [IU]/kg | INTRAMUSCULAR | Status: DC | PRN

## 2017-03-29 MED ORDER — HEPARIN SODIUM (PORCINE) 1000 UNIT/ML DIALYSIS: 1000.0000 [IU] | INTRAMUSCULAR | Status: DC | PRN

## 2017-03-29 NOTE — Progress Notes (Signed)
Patient ID: Shawna [name], female   DOB: [birth year], 76 y.o.   MRN: [id number]  [facility] Surgery Progress Note     Subjective: CC- intraabdominal abscess Husband at bedside. Patient reports less abdominal pain today, although it is still present but mild in LLQ. Denies n/v. Tolerating clears. BM with suppository yesterday. She did have an episode of chest pain earlier this morning during bath. EKG and troponins were ordered, patient received nitro.  Objective: Vital signs in last 24 hours: Temp:  [98 F (36.7 C)-98.3 F (36.8 C)] 98 F (36.7 C) (09/19 0838) Pulse Rate:  [67-73] 71 (09/19 0838) Resp:  [16-20] 19 (09/19 0838) BP: (124-189)/(49-65) 189/65 (09/19 0838) SpO2:  [96 %-100 %] 100 % (09/19 0838) Last BM Date: 03/27/17  Intake/Output from previous day: 09/18 0701 - 09/19 0700 In: 340 [P.O.:340] Out: 0  Intake/Output this shift: No intake/output data recorded.  PE: Gen:  Alert, NAD, pleasant HEENT: EOM's intact, pupils equal and round Card:  RRR, no M/G/R heard Pulm:  CTAB, no W/R/R, effort normal Abd: well healed midline incision, soft, ND, +BS, no masses, hernias, or organomegaly. Mild LLQ TTP with no rebound or guarding Ext:  No erythema, edema, or tenderness BUE/BLE  Skin: no rashes noted, warm and dry  Lab Results:   Recent Labs  03/27/17 1315  WBC 4.7  HGB 10.8*  HCT 33.5*  PLT 213   BMET  Recent Labs  03/27/17 0241 03/27/17 1315  NA 129* 128*  K 5.4* 4.5  CL 95* 92*  CO2 25 27  GLUCOSE 48* 122*  BUN 30* 35*  CREATININE 7.82* 8.66*  CALCIUM 7.9* 7.5*   PT/INR  Recent Labs  03/28/17 0758  LABPROT 12.9  INR 0.98   CMP     Component Value Date/Time   NA 128 (L) 03/27/2017 1315   K 4.5 03/27/2017 1315   CL 92 (L) 03/27/2017 1315   CO2 27 03/27/2017 1315   GLUCOSE 122 (H) 03/27/2017 1315   BUN 35 (H) 03/27/2017 1315   CREATININE 8.66 (H) 03/27/2017 1315   CALCIUM 7.5 (L) 03/27/2017 1315   CALCIUM 8.1 (L) 05/29/2013 1814    PROT 7.4 03/22/2017 0602   ALBUMIN 2.7 (L) 03/27/2017 1315   AST 19 03/22/2017 0602   ALT 14 03/22/2017 0602   ALKPHOS 104 03/22/2017 0602   BILITOT 0.5 03/22/2017 0602   GFRNONAA 4 (L) 03/27/2017 1315   GFRAA 5 (L) 03/27/2017 1315   Lipase     Component Value Date/Time   LIPASE 31 03/22/2017 0602       Studies/Results: Ir US Guide Vasc Access Right  Result Date: 03/28/2017 INDICATION: 76 year old with abdominal pain and being evaluated for mesenteric ischemia. Patient needs an abdominal CTA but does not have adequate venous access for the CTA examination. Patient is end-stage renal disease and not a candidate for PICC line. Patient has a left upper arm fistula which is maturing and a right jugular dialysis catheter. EXAM: PLACEMENT OF PERIPHERAL IV WITH ULTRASOUND GUIDANCE MEDICATIONS: None ANESTHESIA/SEDATION: None FLUOROSCOPY TIME:  None). COMPLICATIONS: None immediate. PROCEDURE: Right upper arm was evaluated with ultrasound. A patent basilic vein was identified. Right upper arm was prepped and draped in a sterile fashion. Tourniquet was placed. Skin was anesthetized with 1% lidocaine. 21 gauge needle directed into the right basilic vein with ultrasound guidance. Micropuncture dilator set was placed. Catheter aspirated and flushed well. IMPRESSION: Successful placement of a peripheral IV in the right upper arm. This is intended to  be a temporary access that will be removed after the CTA procedure. Electronically Signed   By: Markus Daft M.D.   On: 03/28/2017 13:35   Ir Radiology Peripheral Guided Iv Start  Result Date: 03/28/2017 INDICATION: 76 year old with abdominal pain and being evaluated for mesenteric ischemia. Patient needs an abdominal CTA but does not have adequate venous access for the CTA examination. Patient is end-stage renal disease and not a candidate for PICC line. Patient has a left upper arm fistula which is maturing and a right jugular dialysis catheter. EXAM: PLACEMENT  OF PERIPHERAL IV WITH ULTRASOUND GUIDANCE MEDICATIONS: None ANESTHESIA/SEDATION: None FLUOROSCOPY TIME:  None). COMPLICATIONS: None immediate. PROCEDURE: Right upper arm was evaluated with ultrasound. A patent basilic vein was identified. Right upper arm was prepped and draped in a sterile fashion. Tourniquet was placed. Skin was anesthetized with 1% lidocaine. 21 gauge needle directed into the right basilic vein with ultrasound guidance. Micropuncture dilator set was placed. Catheter aspirated and flushed well. IMPRESSION: Successful placement of a peripheral IV in the right upper arm. This is intended to be a temporary access that will be removed after the CTA procedure. Electronically Signed   By: Markus Daft M.D.   On: 03/28/2017 13:35   Ct Angio Abd/pel W/ And/or W/o  Result Date: 03/28/2017 CLINICAL DATA:  Mesenteric ischemia EXAM: CTA ABDOMEN AND PELVIS wITHOUT AND WITH CONTRAST TECHNIQUE: Multidetector CT imaging of the abdomen and pelvis was performed using the standard protocol during bolus administration of intravenous contrast. Multiplanar reconstructed images and MIPs were obtained and reviewed to evaluate the vascular anatomy. CONTRAST:  80 cc Isovue 370 COMPARISON:  03/22/2017 FINDINGS: VASCULAR Aorta: There are extensive atherosclerotic calcifications throughout the aorta. There is little if any soft plaque visualized in the lower thorax or abdomen. It is nonaneurysmal. Celiac: There is 70-80% narrowing at the origin likely due with to a combination of atherosclerosis an median arcuate ligament syndrome. Branch vessels are patent. SMA: There is 50% narrowing at the origin due to atherosclerotic plaque and calcification. Beyond the origin, it is patent. Branch vessels are grossly patent. Renals: Renal arteries are severely diminutive an atrophic. IMA: There is moderate narrowing at the origin. Branch vessels are patent. Inflow: Mild atherosclerotic calcifications in the right common and external  iliac arteries without significant narrowing. Mild disease in the right internal iliac artery. Similarly, there is mild atherosclerotic calcification and some soft plaque in the left common iliac artery. The external iliac artery is pain. Internal iliac artery is patent. Proximal Outflow: The bilateral femoral arteries are grossly patent within the confines of the examination. There is some soft plaque in the right common femoral artery. There is also a small penetrating atherosclerotic ulcer. See table position -1017.3. Veins: Hepatic, portal, splenic, superior mesenteric, and bilateral renal veins are patent. IVC is patent. Bilateral common and external iliac veins are patent. Review of the MIP images confirms the above findings. NON-VASCULAR Lower chest: Dependent atelectasis bilaterally. Hepatobiliary: Subcentimeter hypodensity in the right lobe of the liver on image 20 is nonspecific. There is also a calcified granuloma in the central liver. Gallbladder is decompressed causing artificial wall thickening. Pancreas: Unremarkable Spleen: Unremarkable Adrenals/Urinary Tract: Severe atrophy of the kidneys is present. The appearance is unchanged compare to the prior study. There is a cyst in the medial mid left kidney. There is an enhancing heterogeneous mass in the upper pole of the right kidney which is stable. Bladder is decompressed. Adrenal glands are within normal limits. Stomach/Bowel: There is a  2.2 x 2.2 cm gas and fluid filled abscess in the left upper quadrant between small bowel loops. No evidence of small-bowel obstruction. Inflammatory changes in the adjacent small bowel are improved. No focal mass of the colon. Diverticulosis throughout the length of the colon is noted. There are postoperative changes in the location of the cecum. There are also postoperative changes at the sigmoid rectum junction. Lymphatic: No abnormal retroperitoneal adenopathy. Reproductive: Uterus is absent.  Adnexa are  unremarkable. Other: There is no free fluid in the pelvis. Musculoskeletal: No vertebral compression deformity. Renal osteodystrophy changes in the spine are present. IMPRESSION: VASCULAR Significant narrowing at the origin of the celiac axis. 50% narrowing at the origin of the SMA. Moderate narrowing at the origin of the IMA. NON-VASCULAR There is a 2.2 cm gas and fluid filled abscess within the left upper quadrant between small bowel loops. Stable right upper lobe renal mass. Please see prior dictation for recommendations. Tiny hypodensity in the liver is nonspecific but likely benign. If patient has a history of malignancy, consider six-month follow-up MRI. Electronically Signed   By: Marybelle Killings M.D.   On: 03/28/2017 12:17    Anti-infectives: Anti-infectives    Start     Dose/Rate Route Frequency Ordered Stop   03/27/17 2200  ertapenem (INVANZ) 0.5 g in sodium chloride 0.9 % 50 mL IVPB     500 mg 100 mL/hr over 30 Minutes Intravenous Every 24 hours 03/27/17 0935 03/30/17 2359   03/26/17 1200  ertapenem (INVANZ) 1 g in sodium chloride 0.9 % 50 mL IVPB  Status:  Discontinued     1 g 100 mL/hr over 30 Minutes Intravenous Every T-Th-Sa (Hemodialysis) 03/26/17 1151 03/27/17 1152   03/24/17 1200  ertapenem (INVANZ) 1 g in sodium chloride 0.9 % 50 mL IVPB  Status:  Discontinued     1 g 100 mL/hr over 30 Minutes Intravenous Every M-W-F (Hemodialysis) 03/23/17 0830 03/26/17 1151   03/22/17 0745  ertapenem (INVANZ) 1 g in sodium chloride 0.9 % 50 mL IVPB  Status:  Discontinued     1 g 100 mL/hr over 30 Minutes Intravenous  Once 03/22/17 0735 03/27/17 0935   03/22/17 0715  clindamycin (CLEOCIN) IVPB 600 mg  Status:  Discontinued     600 mg 100 mL/hr over 30 Minutes Intravenous  Once 03/22/17 0700 03/22/17 0748   03/22/17 0700  metroNIDAZOLE (FLAGYL) IVPB 500 mg  Status:  Discontinued     500 mg 100 mL/hr over 60 Minutes Intravenous  Once 03/22/17 0657 03/22/17 0658   03/22/17 0700  clindamycin  (CLEOCIN) IVPB 600 mg  Status:  Discontinued     600 mg 100 mL/hr over 30 Minutes Intravenous  Once 03/22/17 0658 03/22/17 0700       Assessment/Plan ESRD on HD CAD/NSTEMI/PCI on Brillinta chronic diastolic CHF HTN HLD Anemia of chronic disease  Abdominal pain suspected due to mesenteric ischemia &small bowel enteritis/Abscess - CT 9/12 showed short segment of inflamed small bowel surrounding a small focus of air extending beyond the bowel wall which may represent a contained perforation or small bowel diverticulitis. Trace ascites is likely reactive - U/s abdominal pelvic art/vent flow 9/16 showed elevated velocities at the origin of the celiac artery, superior mesenteric artery, inferior mesenteric artery, compatible with greater than 70% stenosis at all 3 mesenteric vessels origin - CT angio 9/18 showed 50% narrowing at the origin of the SMA, Moderate narrowing at the origin of the IMA, and a 2.2 cm gas and fluid filled  abscess within the left upper quadrant between small bowel loops  ID - invanz 9/12>>day#8. Plan 10 days abx VTE - SCDs, heparin FEN - IVF, clear liquid diet Foley - none Follow up - TBD  Plan - CBC pending this morning. Patient afebrile. Pain slightly improved today. Would continue clear liquid diet and invanz. Plan to continue Invanz x10 days.  Will continue to monitor abdominal exam.   LOS: 7 days    Wellington Hampshire , [facility] Surgery 03/29/2017, 9:14 AM Pager: [phone] Consults: [phone] Mon-Fri 7:00 am-4:30 pm Sat-Sun 7:00 am-11:30 am

## 2017-03-29 NOTE — Progress Notes (Signed)
Hypoglycemic Event  CBG: Results for Shawna [name], Shawna [name] (MRN [id number]) as of 03/29/2017 22:37  Ref. Range 03/29/2017 20:46  Glucose-Capillary Latest Ref Range: 65 - 99 mg/dL 63 (L)    Treatment: D50 IV 25 mL  Symptoms: None  Follow-up CBG: Time: CBG Result  Results for Shawna [name], Shawna [name] (MRN [id number]) as of 03/29/2017 22:38  Ref. Range 03/29/2017 21:31  Glucose-Capillary Latest Ref Range: 65 - 99 mg/dL 173 (H)   Possible Reasons for Event: Inadequate meal intake  Comments/MD notified:    Viviano Simas

## 2017-03-29 NOTE — Progress Notes (Addendum)
PROGRESS NOTE   Shawna [name]  LGX:[id number]    DOB: 1940-07-20    DOA: 03/22/2017  PCP: [name], MD   I have briefly reviewed patients previous medical records in [facility].  Brief Narrative:  76 year old married female, PMH of ESRD on MWF HD (Dr. Lowanda Foster, Nephrology), CAD/NSTEMI/PCI, chronic diastolic CHF (Dr. Domenic Polite, Cardiology), HTN, HLD, iron deficiency anemia, GI bleed due to small bowel angiodysplasia, initially admitted to [facility] on 03/22/17 for acute onset of lower abdominal pain and diarrhea. CT revealed focal enteritis-diverticulitis and question of contained perforation. GI and general surgery consulted and initially managed conservatively for presumed enteritis with bowel rest and IV antibiotics. Due to ongoing postprandial abdominal pain, suspected bowel ischemia and underwent an abdominal Doppler which showed more than 70% stenosis of all 3 mesenteric vessels at their origins. GI/Gen. surgery discussed with IR at [facility] and patient was transferred over on 9/16 for for possible stenting of mesenteric vessels. IR and nephrology consulted at [facility].CT abdomen 9/18 showed mesenteric vessel stenosis but not enough to require stenting as per discussion with IR, did reveal new intra-abdominal abscess, Gen. surgery was consulted. Intermittent chest pain since 9/18, cardiology consulted.   Assessment & Plan:   Active Problems:   SBO (small bowel obstruction) (HCC)   Enteritis   Diverticulitis   Mesenteric ischemia ([place])   1. Abdominal pain suspected due to mesenteric ischemia & small bowel enteritis/Abscess: History as outlined above. Initially managed conservatively for possible enteritis/diverticulitis with bowel rest and IV ertapenem. Due to ongoing postprandial abdominal pain, underwent abdominal Doppler ultrasound at [facility](unable to get CTA abdomen due to no IV access for dye administration) which showed more than 70% stenosis at all 3 mesenteric  vessel origins. Transferred from [facility] to [facility] on 9/16 for evaluation by interventional radiology for possible intervention/stenting. S/P CTA abdomen 9/18 (significant narrowing at the origin of the celiac axis, 50% narrowing at SMA origin and moderate narrowing at IMA origin). I discussed with Dr. Barbie Banner, IR on 9/18 who indicated that based on patient's study, stenting may not be indicated but IR will do a formal consultation. CTA abdomen also showed 2.2 cm gas and fluid-filled abscess within the left upper quadrant between small bowel loops, not amenable to percutaneous drainage as per my discussion with IR on 9/18. General surgery consulted and managed conservatively with bowel rest/clear liquids, completed total 10 days of IV antibiotics (on ertapenem since 9/14). Patient reports some confusion? Hallucinations and asks if this is related to antibiotics. Ertapenem rarely causes mental status changes but likely due to prednisone premedication for contrasted CT on 9/18. Continue to monitor. 2. ESRD on MWF HD: Nephrology was consulting for dialysis needs at [facility]. Ongoing HD per nephrology. 3. Hyperkalemia: Secondary to ESRD in dialysis patient. Resolved after HD on 9/17. 4. Hyponatremia: Likely related to ESRD and management per nephrology. Stable. 5. Secondary hyperparathyroidism: Per nephrology. 6. Left upper extremity pain: Mostly in the hand. Left upper extremity Dopplers notable for probable chronic thrombosis in the cephalic veins in the patient's dialysis fistula in the left arm, remaining veins are widely patent, dialysis fistula is widely patent. Resolved  7. Essential hypertension: Mildly uncontrolled. Continue carvedilol, Imdur and when necessary hydralazine. Volume management across HD. 8. Anemia of chronic disease and ESRD: Stable. Follow periodically across dialysis. 9. Chest pain/CAD/prior NSTEMI: Continue aspirin, carvedilol, Imdur and Brillinta. CT  abdomen and pelvis 9/12 showed severe coronary artery calcification. Since 9/18, patient  has reported 2 episodes of chest pain relieved promptly by sublingual NTG. Reports intermittent but rare chest pain with minimal exertion at home. EKG without acute changes. Requested troponin, to be drawn. 10. Renal mass: CT abdomen 9/12 showed stable right kidney upper pole 2.9 cm indeterminate mass and left kidney interpolar 1.6 cm indeterminate mass. Renal protocol CT with and without contrast was recommended to further characterize, defer to nephrology. 11. Hypoglycemia: Blood glucose 48 mg per DL noted on a.m. labs 9/17. Was treated briefly with IV D5 infusion. Hypoglycemia resolved. Tolerating diet. Discontinue D5 and monitor CBGs closely. 12. ? History of A. fib: Noted in outpatient cardiologist progress note date/23/18. Remains on amiodarone, carvedilol, ASA and Brillinta. 13. Carotid artery disease: 14. Hyperlipidemia   DVT prophylaxis: Subcutaneous heparin  Code Status: Full  Family Communication: Discussed in detail with patient's spouse at bedside.  Disposition: DC home when medically improved. Not medically ready for discharge.   Consultants:  Nephrology GI at [facility] [place] [facility]  General surgery at [facility]  Procedures:  Hemodialysis   Antimicrobials:  IV Ertapenem    Subjective: Patient reports significant improvement in her abdominal pain, mild intermittent left lower quadrant pain. Tolerating clear liquids without nausea or vomiting. Had couple of loose stools with mucus after suppository yesterday. Noted 2 episodes of chest pain in the last 24 hours. Reports starting at the mid back, radiating across the right side to her epigastrium/precordium, unclear nature, moderate to severe at times, brought on by minimal activity, lasts a few minutes but resolved spontaneously after sublingual NTG. Also reports infrequent, up to 3-4 episodes per month  of similar chest pains at home with minimal activity. Patient/spouse also reports some confusion and? Hallucinations.  ROS: No chest pain, dyspnea or palpitations.   Objective:  Vitals:   03/28/17 1000 03/28/17 1800 03/29/17 0602 03/29/17 0838  BP: (!) 124/49 (!) 130/55 (!) 175/55 (!) 189/65  Pulse:  73 67 71  Resp: 18 16 20 19   Temp:   98.3 F (36.8 C) 98 F (36.7 C)  TempSrc:   Oral Oral  SpO2: 96% 98% 99% 100%  Weight:      Height:        Examination:  General exam: Pleasant elderly female, moderately built and nourished, lying comfortably supine in bed. Does not appear in any distress. Respiratory system: Clear to auscultation. Respiratory effort normal. Stable without change Cardiovascular system: S1 & S2 heard, RRR. No JVD, murmurs, rubs, gallops or clicks. No pedal edema.Telemetry: Sinus rhythm. Stable without change Gastrointestinal system: Abdomen is nondistended, soft. Minimal LLQ tenderness without peritoneal signs. No organomegaly or masses felt. Normal bowel sounds heard. Stable without change Central nervous system: Alert and oriented. No focal neurological deficits. Stable Extremities: Symmetric 5 x 5 power. Skin: No rashes, lesions or ulcers Psychiatry: Judgement and insight appear normal. Mood & affect: Slightly anxious.     Data Reviewed: I have personally reviewed following labs and imaging studies  CBC:  Recent Labs Lab 03/23/17 0519 03/24/17 0518 03/25/17 0613 03/27/17 1315  WBC 5.3 6.1 6.4 4.7  HGB 10.8* 11.9* 11.3* 10.8*  HCT 33.9* 36.8 34.3* 33.5*  MCV 105.0* 103.1* 101.2* 99.1  PLT 201 212 237 161   Basic Metabolic Panel:  Recent Labs Lab 03/24/17 0518 03/25/17 0613 03/26/17 0801 03/27/17 0241 03/27/17 1315  NA 133* 131* 132* 129* 128*  K 4.6 4.9 4.5 5.4* 4.5  CL 95* 94* 92* 95* 92*  CO2 26 27 27  25  27  GLUCOSE 73 103* 66 48* 122*  BUN 36* 22* 23* 30* 35*  CREATININE 8.15* 6.08* 5.89* 7.82* 8.66*  CALCIUM 7.9* 8.1* 8.5* 7.9*  7.5*  PHOS 5.2*  --  5.4* 5.5* 5.5*   Liver Function Tests:  Recent Labs Lab 03/24/17 0518 03/26/17 0801 03/27/17 0241 03/27/17 1315  ALBUMIN 3.1* 3.0* 2.9* 2.7*   Coagulation Profile:  Recent Labs Lab 03/28/17 0758  INR 0.98   Cardiac Enzymes: No results for input(s): CKTOTAL, CKMB, CKMBINDEX, TROPONINI in the last 168 hours. HbA1C: No results for input(s): HGBA1C in the last 72 hours. CBG:  Recent Labs Lab 03/27/17 2058 03/28/17 0745 03/28/17 1241 03/28/17 1702 03/29/17 0746  GLUCAP 131* 208* 147* 213* 117*    Recent Results (from the past 240 hour(s))  MRSA PCR Screening     Status: None   Collection Time: 03/22/17  8:51 AM  Result Value Ref Range Status   MRSA by PCR NEGATIVE NEGATIVE Final    Comment:        The GeneXpert MRSA Assay (FDA approved for NASAL specimens only), is one component of a comprehensive MRSA colonization surveillance program. It is not intended to diagnose MRSA infection nor to guide or monitor treatment for MRSA infections.       EKG 9/18 personally reviewed: SR at 72 bpm, Incomplete LBBB, LVH with repolarization, Q waves & elevated J point in V1-V2, QTC 505 ? Accuracy d/t BBB. zdoes not look much different from EKG on 02/08/2017.   Radiology Studies: Dg Chest 2 View  Result Date: 03/29/2017 CLINICAL DATA:  Shortness of breath with cough. EXAM: CHEST  2 VIEW COMPARISON:  03/22/2017. FINDINGS: The heart is enlarged. There is coronary artery calcification and thoracic atherosclerosis. There is no consolidation or edema. Dual lumen catheter unchanged, tips proximal RIGHT atrium. IMPRESSION: Stable chest. Electronically Signed   By: Staci Righter M.D.   On: 03/29/2017 09:33   Ir US Guide Vasc Access Right  Result Date: 03/28/2017 INDICATION: 76 year old with abdominal pain and being evaluated for mesenteric ischemia. Patient needs an abdominal CTA but does not have adequate venous access for the CTA examination. Patient is  end-stage renal disease and not a candidate for PICC line. Patient has a left upper arm fistula which is maturing and a right jugular dialysis catheter. EXAM: PLACEMENT OF PERIPHERAL IV WITH ULTRASOUND GUIDANCE MEDICATIONS: None ANESTHESIA/SEDATION: None FLUOROSCOPY TIME:  None). COMPLICATIONS: None immediate. PROCEDURE: Right upper arm was evaluated with ultrasound. A patent basilic vein was identified. Right upper arm was prepped and draped in a sterile fashion. Tourniquet was placed. Skin was anesthetized with 1% lidocaine. 21 gauge needle directed into the right basilic vein with ultrasound guidance. Micropuncture dilator set was placed. Catheter aspirated and flushed well. IMPRESSION: Successful placement of a peripheral IV in the right upper arm. This is intended to be a temporary access that will be removed after the CTA procedure. Electronically Signed   By: Markus Daft M.D.   On: 03/28/2017 13:35   Ir Radiology Peripheral Guided Iv Start  Result Date: 03/28/2017 INDICATION: 76 year old with abdominal pain and being evaluated for mesenteric ischemia. Patient needs an abdominal CTA but does not have adequate venous access for the CTA examination. Patient is end-stage renal disease and not a candidate for PICC line. Patient has a left upper arm fistula which is maturing and a right jugular dialysis catheter. EXAM: PLACEMENT OF PERIPHERAL IV WITH ULTRASOUND GUIDANCE MEDICATIONS: None ANESTHESIA/SEDATION: None FLUOROSCOPY TIME:  None). COMPLICATIONS: None immediate.  PROCEDURE: Right upper arm was evaluated with ultrasound. A patent basilic vein was identified. Right upper arm was prepped and draped in a sterile fashion. Tourniquet was placed. Skin was anesthetized with 1% lidocaine. 21 gauge needle directed into the right basilic vein with ultrasound guidance. Micropuncture dilator set was placed. Catheter aspirated and flushed well. IMPRESSION: Successful placement of a peripheral IV in the right upper arm.  This is intended to be a temporary access that will be removed after the CTA procedure. Electronically Signed   By: Markus Daft M.D.   On: 03/28/2017 13:35   Ct Angio Abd/pel W/ And/or W/o  Result Date: 03/28/2017 CLINICAL DATA:  Mesenteric ischemia EXAM: CTA ABDOMEN AND PELVIS wITHOUT AND WITH CONTRAST TECHNIQUE: Multidetector CT imaging of the abdomen and pelvis was performed using the standard protocol during bolus administration of intravenous contrast. Multiplanar reconstructed images and MIPs were obtained and reviewed to evaluate the vascular anatomy. CONTRAST:  80 cc Isovue 370 COMPARISON:  03/22/2017 FINDINGS: VASCULAR Aorta: There are extensive atherosclerotic calcifications throughout the aorta. There is little if any soft plaque visualized in the lower thorax or abdomen. It is nonaneurysmal. Celiac: There is 70-80% narrowing at the origin likely due with to a combination of atherosclerosis an median arcuate ligament syndrome. Branch vessels are patent. SMA: There is 50% narrowing at the origin due to atherosclerotic plaque and calcification. Beyond the origin, it is patent. Branch vessels are grossly patent. Renals: Renal arteries are severely diminutive an atrophic. IMA: There is moderate narrowing at the origin. Branch vessels are patent. Inflow: Mild atherosclerotic calcifications in the right common and external iliac arteries without significant narrowing. Mild disease in the right internal iliac artery. Similarly, there is mild atherosclerotic calcification and some soft plaque in the left common iliac artery. The external iliac artery is pain. Internal iliac artery is patent. Proximal Outflow: The bilateral femoral arteries are grossly patent within the confines of the examination. There is some soft plaque in the right common femoral artery. There is also a small penetrating atherosclerotic ulcer. See table position -1017.3. Veins: Hepatic, portal, splenic, superior mesenteric, and bilateral  renal veins are patent. IVC is patent. Bilateral common and external iliac veins are patent. Review of the MIP images confirms the above findings. NON-VASCULAR Lower chest: Dependent atelectasis bilaterally. Hepatobiliary: Subcentimeter hypodensity in the right lobe of the liver on image 20 is nonspecific. There is also a calcified granuloma in the central liver. Gallbladder is decompressed causing artificial wall thickening. Pancreas: Unremarkable Spleen: Unremarkable Adrenals/Urinary Tract: Severe atrophy of the kidneys is present. The appearance is unchanged compare to the prior study. There is a cyst in the medial mid left kidney. There is an enhancing heterogeneous mass in the upper pole of the right kidney which is stable. Bladder is decompressed. Adrenal glands are within normal limits. Stomach/Bowel: There is a 2.2 x 2.2 cm gas and fluid filled abscess in the left upper quadrant between small bowel loops. No evidence of small-bowel obstruction. Inflammatory changes in the adjacent small bowel are improved. No focal mass of the colon. Diverticulosis throughout the length of the colon is noted. There are postoperative changes in the location of the cecum. There are also postoperative changes at the sigmoid rectum junction. Lymphatic: No abnormal retroperitoneal adenopathy. Reproductive: Uterus is absent.  Adnexa are unremarkable. Other: There is no free fluid in the pelvis. Musculoskeletal: No vertebral compression deformity. Renal osteodystrophy changes in the spine are present. IMPRESSION: VASCULAR Significant narrowing at the origin of the celiac  axis. 50% narrowing at the origin of the SMA. Moderate narrowing at the origin of the IMA. NON-VASCULAR There is a 2.2 cm gas and fluid filled abscess within the left upper quadrant between small bowel loops. Stable right upper lobe renal mass. Please see prior dictation for recommendations. Tiny hypodensity in the liver is nonspecific but likely benign. If patient  has a history of malignancy, consider six-month follow-up MRI. Electronically Signed   By: Marybelle Killings M.D.   On: 03/28/2017 12:17        Scheduled Meds: . amiodarone  200 mg Oral Daily  . aspirin EC  81 mg Oral Daily  . bisacodyl  10 mg Rectal q12n4p  . carvedilol  6.25 mg Oral BID WC  . cinacalcet  30 mg Oral QPC supper  . doxercalciferol  2.5 mcg Intravenous Q M,W,F-HD  . famotidine  20 mg Oral QODAY  . heparin  5,000 Units Subcutaneous Q8H  . isosorbide mononitrate  120 mg Oral Daily  . lidocaine-prilocaine   Topical Q M,W,F-HD  . multivitamin  1 tablet Oral QHS  . sevelamer carbonate  800 mg Oral TID WC  . ticagrelor  90 mg Oral BID   Continuous Infusions: . sodium chloride    . sodium chloride    . sodium chloride    . sodium chloride    . sodium chloride    . sodium chloride    . dextrose 20 mL/hr at 03/27/17 1747  . ertapenem 0.5 g (03/29/17 0050)     LOS: 7 days     Anyia Gierke, MD, FACP, FHM. [facility] Pager [phone]  If 7PM-7AM, please contact night-coverage [email or web address] Password [email or web address] 03/29/2017, 11:01 AM

## 2017-03-29 NOTE — Progress Notes (Signed)
[facility] Progress Note   Dialysis Orders:[facility] 56.5. 3 hours 45 min profile 2 HD [place] 2K/2.5 calc, Heparin yes- 2000 bolus+ 200 per hour right .IJ TDCand left upper AVGG 02/24/17 - Dr. Ronalee Belts placed PC 350 BFR, 600 DFR- BP will drop fast according to nursing. Epo 7800 per HD , hectorol 2.82mcg q HD, venofer 50 , Epo 7800 , fosrenol 1000 TID for phos binding   Assessment/Plan: 1. Abdominal paindue to possible mesenteric ischemia with small bowel enteritis/abscess; CT angio yesterdayshowed 2.2 cm gas and fluid filled abscess in LUQ which correlates with tenderness on exam; not ammenable to percutaneous drain; on Meropenem for 7 days? Add other antibiotics; other significant areas of mesenteric narrowing as per report. 2. [facility]- left upper AVGG is patent and 13 weeks old - should be ok to with  dialysis today- will use EMLA pre HD - watch for symptoms of steal during HD treatment start with 16 gauge needles 3. Hypertension/volume- keep systolic BP > 563 due to risk of mesenteric ischemia/volume status ok net UF 894 9/17 with post wt 55.8; volume not an issue at pressent 4. Anemia- hgb 11.3 > 10.8-  on Epo 7800 TIW - continue weekly Fe; start low dose Aranesp if hgb drops < 10.5- check labs pre HD 5. Metabolic bone disease- Continue hectorol/sensipar - NPO so no binders until eating P 5.5 - check labs pre HD 6. Nutrition-alb 2.7 npo - advance as able 7. CAD/hx NSTEMI with CP/SOB this am - EKG neg acute- CXR neg- resolved with NTG - troponin ordered 8. Renal mass - 1.6 cm left kidney nodule new in 5/18- stable 02/2017, right kidney nodule 2.2 cm 2014 to 2.9 11/2016- stable 02/2017- she tells me she had a urology appointment for evaluation but missed it due to a hospitalization and the appointment was never rescheduled - this will need to be done after discharge.  Myriam Jacobson, PA-C [place] 03/29/2017,9:42 AM   LOS: 7 days   Pt seen, examined and agree w A/P as above.  Kelly Splinter MD [facility] pager [phone]   03/29/2017, 12:09 PM    Subjective:   SOB/CP this am. Resolved with NTG. States has had some intermittent symptoms of left hand pain (access side) prior to admission. Worried about using AVGG - requesting EMLA. Left sided pain improving. Still on liquids  Objective Vitals:   03/28/17 1000 03/28/17 1800 03/29/17 0602 03/29/17 0838  BP: (!) 124/49 (!) 130/55 (!) 175/55 (!) 189/65  Pulse:  73 67 71  Resp: 18 16 20 19   Temp:   98.3 F (36.8 C) 98 F (36.7 C)  TempSrc:   Oral Oral  SpO2: 96% 98% 99% 100%  Weight:      Height:       Physical Exam General: slightly anxious Heart: RRR Lungs: no rales Abdomen: soft mild left sided tenderness Extremities:no LE edema Dialysis Access:  Left upper AVGG + bruit - hand warm well perfused   Additional Objective Labs: Basic Metabolic Panel:  Recent Labs Lab 03/26/17 0801 03/27/17 0241 03/27/17 1315  NA 132* 129* 128*  K 4.5 5.4* 4.5  CL 92* 95* 92*  CO2 27 25 27   GLUCOSE 66 48* 122*  BUN 23* 30* 35*  CREATININE 5.89* 7.82* 8.66*  CALCIUM 8.5* 7.9* 7.5*  PHOS 5.4* 5.5* 5.5*   Liver Function Tests:  Recent Labs Lab 03/26/17 0801 03/27/17 0241 03/27/17 1315  ALBUMIN 3.0* 2.9* 2.7*  CBC:  Recent Labs Lab 03/23/17 0519 03/24/17 0518 03/25/17 0613 03/27/17 1315  WBC 5.3 6.1 6.4 4.7  HGB 10.8* 11.9* 11.3* 10.8*  HCT 33.9* 36.8 34.3* 33.5*  MCV 105.0* 103.1* 101.2* 99.1  PLT 201 212 237 213   Blood Culture    Component Value Date/Time   SDES BLOOD RIGHT ARM 10/10/2016 0124   SPECREQUEST IN PEDIATRIC BOTTLE Blood Culture adequate volume 10/10/2016 0124   CULT NO GROWTH 5 DAYS 10/10/2016 0124   REPTSTATUS 10/15/2016 FINAL 10/10/2016 0124    Cardiac Enzymes: No results for input(s): CKTOTAL, CKMB, CKMBINDEX, TROPONINI in the last 168 hours. CBG:  Recent Labs Lab 03/27/17 2058  03/28/17 0745 03/28/17 1241 03/28/17 1702 03/29/17 0746  GLUCAP 131* 208* 147* 213* 117*   Iron Studies: No results for input(s): IRON, TIBC, TRANSFERRIN, FERRITIN in the last 72 hours. Lab Results  Component Value Date   INR 0.98 03/28/2017   INR 1.01 02/08/2017   INR 1.05 01/24/2017   Studies/Results: Dg Chest 2 View  Result Date: 03/29/2017 CLINICAL DATA:  Shortness of breath with cough. EXAM: CHEST  2 VIEW COMPARISON:  03/22/2017. FINDINGS: The heart is enlarged. There is coronary artery calcification and thoracic atherosclerosis. There is no consolidation or edema. Dual lumen catheter unchanged, tips proximal RIGHT atrium. IMPRESSION: Stable chest. Electronically Signed   By: Staci Righter M.D.   On: 03/29/2017 09:33   Ir US Guide Vasc Access Right  Result Date: 03/28/2017 INDICATION: 76 year old with abdominal pain and being evaluated for mesenteric ischemia. Patient needs an abdominal CTA but does not have adequate venous access for the CTA examination. Patient is end-stage renal disease and not a candidate for PICC line. Patient has a left upper arm fistula which is maturing and a right jugular dialysis catheter. EXAM: PLACEMENT OF PERIPHERAL IV WITH ULTRASOUND GUIDANCE MEDICATIONS: None ANESTHESIA/SEDATION: None FLUOROSCOPY TIME:  None). COMPLICATIONS: None immediate. PROCEDURE: Right upper arm was evaluated with ultrasound. A patent basilic vein was identified. Right upper arm was prepped and draped in a sterile fashion. Tourniquet was placed. Skin was anesthetized with 1% lidocaine. 21 gauge needle directed into the right basilic vein with ultrasound guidance. Micropuncture dilator set was placed. Catheter aspirated and flushed well. IMPRESSION: Successful placement of a peripheral IV in the right upper arm. This is intended to be a temporary access that will be removed after the CTA procedure. Electronically Signed   By: Markus Daft M.D.   On: 03/28/2017 13:35   Ir Radiology  Peripheral Guided Iv Start  Result Date: 03/28/2017 INDICATION: 76 year old with abdominal pain and being evaluated for mesenteric ischemia. Patient needs an abdominal CTA but does not have adequate venous access for the CTA examination. Patient is end-stage renal disease and not a candidate for PICC line. Patient has a left upper arm fistula which is maturing and a right jugular dialysis catheter. EXAM: PLACEMENT OF PERIPHERAL IV WITH ULTRASOUND GUIDANCE MEDICATIONS: None ANESTHESIA/SEDATION: None FLUOROSCOPY TIME:  None). COMPLICATIONS: None immediate. PROCEDURE: Right upper arm was evaluated with ultrasound. A patent basilic vein was identified. Right upper arm was prepped and draped in a sterile fashion. Tourniquet was placed. Skin was anesthetized with 1% lidocaine. 21 gauge needle directed into the right basilic vein with ultrasound guidance. Micropuncture dilator set was placed. Catheter aspirated and flushed well. IMPRESSION: Successful placement of a peripheral IV in the right upper arm. This is intended to be a temporary access that will be removed after the CTA procedure. Electronically Signed   By: Quita Skye  Anselm Pancoast M.D.   On: 03/28/2017 13:35   Ct Angio Abd/pel W/ And/or W/o  Result Date: 03/28/2017 CLINICAL DATA:  Mesenteric ischemia EXAM: CTA ABDOMEN AND PELVIS wITHOUT AND WITH CONTRAST TECHNIQUE: Multidetector CT imaging of the abdomen and pelvis was performed using the standard protocol during bolus administration of intravenous contrast. Multiplanar reconstructed images and MIPs were obtained and reviewed to evaluate the vascular anatomy. CONTRAST:  80 cc Isovue 370 COMPARISON:  03/22/2017 FINDINGS: VASCULAR Aorta: There are extensive atherosclerotic calcifications throughout the aorta. There is little if any soft plaque visualized in the lower thorax or abdomen. It is nonaneurysmal. Celiac: There is 70-80% narrowing at the origin likely due with to a combination of atherosclerosis an median arcuate  ligament syndrome. Branch vessels are patent. SMA: There is 50% narrowing at the origin due to atherosclerotic plaque and calcification. Beyond the origin, it is patent. Branch vessels are grossly patent. Renals: Renal arteries are severely diminutive an atrophic. IMA: There is moderate narrowing at the origin. Branch vessels are patent. Inflow: Mild atherosclerotic calcifications in the right common and external iliac arteries without significant narrowing. Mild disease in the right internal iliac artery. Similarly, there is mild atherosclerotic calcification and some soft plaque in the left common iliac artery. The external iliac artery is pain. Internal iliac artery is patent. Proximal Outflow: The bilateral femoral arteries are grossly patent within the confines of the examination. There is some soft plaque in the right common femoral artery. There is also a small penetrating atherosclerotic ulcer. See table position -1017.3. Veins: Hepatic, portal, splenic, superior mesenteric, and bilateral renal veins are patent. IVC is patent. Bilateral common and external iliac veins are patent. Review of the MIP images confirms the above findings. NON-VASCULAR Lower chest: Dependent atelectasis bilaterally. Hepatobiliary: Subcentimeter hypodensity in the right lobe of the liver on image 20 is nonspecific. There is also a calcified granuloma in the central liver. Gallbladder is decompressed causing artificial wall thickening. Pancreas: Unremarkable Spleen: Unremarkable Adrenals/Urinary Tract: Severe atrophy of the kidneys is present. The appearance is unchanged compare to the prior study. There is a cyst in the medial mid left kidney. There is an enhancing heterogeneous mass in the upper pole of the right kidney which is stable. Bladder is decompressed. Adrenal glands are within normal limits. Stomach/Bowel: There is a 2.2 x 2.2 cm gas and fluid filled abscess in the left upper quadrant between small bowel loops. No  evidence of small-bowel obstruction. Inflammatory changes in the adjacent small bowel are improved. No focal mass of the colon. Diverticulosis throughout the length of the colon is noted. There are postoperative changes in the location of the cecum. There are also postoperative changes at the sigmoid rectum junction. Lymphatic: No abnormal retroperitoneal adenopathy. Reproductive: Uterus is absent.  Adnexa are unremarkable. Other: There is no free fluid in the pelvis. Musculoskeletal: No vertebral compression deformity. Renal osteodystrophy changes in the spine are present. IMPRESSION: VASCULAR Significant narrowing at the origin of the celiac axis. 50% narrowing at the origin of the SMA. Moderate narrowing at the origin of the IMA. NON-VASCULAR There is a 2.2 cm gas and fluid filled abscess within the left upper quadrant between small bowel loops. Stable right upper lobe renal mass. Please see prior dictation for recommendations. Tiny hypodensity in the liver is nonspecific but likely benign. If patient has a history of malignancy, consider six-month follow-up MRI. Electronically Signed   By: Marybelle Killings M.D.   On: 03/28/2017 12:17   Medications: . sodium chloride    .  sodium chloride    . sodium chloride    . sodium chloride    . sodium chloride    . sodium chloride    . dextrose 20 mL/hr at 03/27/17 1747  . ertapenem 0.5 g (03/29/17 0050)   . amiodarone  200 mg Oral Daily  . aspirin EC  81 mg Oral Daily  . bisacodyl  10 mg Rectal q12n4p  . carvedilol  6.25 mg Oral BID WC  . cinacalcet  30 mg Oral QPC supper  . doxercalciferol  2.5 mcg Intravenous Q M,W,F-HD  . famotidine  20 mg Oral QODAY  . heparin  5,000 Units Subcutaneous Q8H  . isosorbide mononitrate  120 mg Oral Daily  . multivitamin  1 tablet Oral QHS  . sevelamer carbonate  800 mg Oral TID WC  . ticagrelor  90 mg Oral BID

## 2017-03-29 NOTE — Plan of Care (Signed)
Problem: Fluid Volume: Goal: Compliance with measures to maintain balanced fluid volume will improve Outcome: Progressing Patient aware of need to control fluid volume intake. Will continue to reinforce education.

## 2017-03-29 NOTE — Progress Notes (Signed)
Hypoglycemic Event  CBG: Results for Shawna [name], Shawna [name] (MRN [id number]) as of 03/29/2017 22:36  Ref. Range 03/29/2017 20:09  Glucose-Capillary Latest Ref Range: 65 - 99 mg/dL 64 (L)    Treatment: 15 GM carbohydrate snack  Symptoms: None  Follow-up CBG: Time: CBG Result:  Results for Shawna [name], Shawna [name] (MRN [id number]) as of 03/29/2017 22:37  Ref. Range 03/29/2017 20:46  Glucose-Capillary Latest Ref Range: 65 - 99 mg/dL 63 (L)   Possible Reasons for Event: Inadequate meal intake  Comments/MD notified:    Viviano Simas

## 2017-03-29 NOTE — Progress Notes (Signed)
Referring Physician(s):  Dr. Mickeal Needy  Supervising Physician: Aletta Edouard  Patient Status:  [facility] - In-pt  Chief Complaint:  Abdominal pain  Subjective: Sleepy, but arousable and engages in conversation.  Ate breakfast with tolerance this AM.   Allergies: Aspirin; Penicillins; Amlodipine; Bactrim [sulfamethoxazole-trimethoprim]; Contrast media [iodinated diagnostic agents]; Iron; Nitrofurantoin; Tylenol [acetaminophen]; Gabapentin; Dexilant [dexlansoprazole]; Levaquin [levofloxacin in d5w]; Morphine and related; Plavix [clopidogrel bisulfate]; Protonix [pantoprazole sodium]; and Venofer [ferric oxide]  Medications: Prior to Admission medications   Medication Sig Start Date End Date Taking? Authorizing Provider  ALPRAZolam (XANAX) 0.25 MG tablet Take 0.25 mg by mouth 2 (two) times daily as needed for anxiety or sleep.  11/19/15  Yes [provider]  amiodarone (PACERONE) 200 MG tablet Take 1 tablet (200 mg total) by mouth daily. 12/08/16  Yes Herminio Commons, MD  aspirin EC 81 MG tablet Take 81 mg by mouth daily.    Yes [provider]  carvedilol (COREG) 6.25 MG tablet Take 1 tablet (6.25 mg total) by mouth 2 (two) times daily with a meal. 11/16/16  Yes Herminio Commons, MD  cinacalcet (SENSIPAR) 30 MG tablet Take 30 mg by mouth daily after supper.   Yes [provider]  cloNIDine (CATAPRES) 0.2 MG tablet Take 1 tablet (0.2 mg total) by mouth 3 (three) times daily. Patient taking differently: Take 0.2 mg by mouth 2 (two) times daily as needed (high blood pressure).  12/08/16  Yes Herminio Commons, MD  diphenhydrAMINE (BENADRYL) 25 mg capsule Take 50 mg by mouth daily as needed for itching or allergies.    Yes [provider]  epoetin alfa (EPOGEN,PROCRIT) 14782 UNIT/ML injection Inject 1 mL (10,000 Units total) into the vein every Monday, Wednesday, and Friday with hemodialysis. 04/11/16  Yes Orvan Falconer, MD  fluticasone (FLONASE) 50 MCG/ACT  nasal spray Place 2 sprays into the nose at bedtime as needed for allergies.    Yes [provider]  isosorbide mononitrate (IMDUR) 120 MG 24 hr tablet Take 1 tablet (120 mg total) by mouth daily. 01/21/17  Yes Caren Griffins, MD  multivitamin (RENA-VIT) TABS tablet Take 1 tablet by mouth daily.   Yes [provider]  nitroGLYCERIN (NITROSTAT) 0.4 MG SL tablet Place 1 tablet (0.4 mg total) under the tongue every 5 (five) minutes as needed for chest pain. 08/02/16  Yes Buriev, Arie Sabina, MD  omeprazole (PRILOSEC) 20 MG capsule Take 20 mg by mouth daily.  09/12/16  Yes [provider]  ondansetron (ZOFRAN-ODT) 4 MG disintegrating tablet Take 4 mg by mouth 2 (two) times daily as needed for nausea or vomiting.   Yes [provider]  oxycodone (OXY-IR) 5 MG capsule Take 1 capsule (5 mg total) by mouth every 6 (six) hours as needed for pain. 02/24/17  Yes Schnier, Dolores Lory, MD  sevelamer carbonate (RENVELA) 800 MG tablet Take 800 mg by mouth 3 (three) times daily with meals.   Yes [provider]  simvastatin (ZOCOR) 20 MG tablet TAKE ONE TABLET BY MOUTH AT BEDTIME 02/13/17  Yes Herminio Commons, MD  ticagrelor (BRILINTA) 90 MG TABS tablet Take 90 mg by mouth 2 (two) times daily.   Yes [provider]  hydrALAZINE (APRESOLINE) 50 MG tablet Take 2 tablets (100 mg total) by mouth 3 (three) times daily. Patient taking differently: Take 100 mg by mouth 2 (two) times daily as needed (for high blood pressure readings).  11/10/16 02/20/17  Herminio Commons, MD  Vital Signs: BP (!) 189/65 (BP Location: Left Leg)   Pulse 71   Temp 98 F (36.7 C) (Oral)   Resp 19   Ht 5\' 2"  (1.575 m)   Wt 123 lb 3.8 oz (55.9 kg)   SpO2 100%   BMI 22.54 kg/m   Physical Exam  NAD, alert Resp:  No increased work of breathing Abd:  Soft, mild tenderness to RLQ, no distention  Imaging: Dg Chest 2 View  Result Date: 03/29/2017 CLINICAL DATA:  Shortness of breath  with cough. EXAM: CHEST  2 VIEW COMPARISON:  03/22/2017. FINDINGS: The heart is enlarged. There is coronary artery calcification and thoracic atherosclerosis. There is no consolidation or edema. Dual lumen catheter unchanged, tips proximal RIGHT atrium. IMPRESSION: Stable chest. Electronically Signed   By: Staci Righter M.D.   On: 03/29/2017 09:33   Ir US Guide Vasc Access Right  Result Date: 03/28/2017 INDICATION: 76 year old with abdominal pain and being evaluated for mesenteric ischemia. Patient needs an abdominal CTA but does not have adequate venous access for the CTA examination. Patient is end-stage renal disease and not a candidate for PICC line. Patient has a left upper arm fistula which is maturing and a right jugular dialysis catheter. EXAM: PLACEMENT OF PERIPHERAL IV WITH ULTRASOUND GUIDANCE MEDICATIONS: None ANESTHESIA/SEDATION: None FLUOROSCOPY TIME:  None). COMPLICATIONS: None immediate. PROCEDURE: Right upper arm was evaluated with ultrasound. A patent basilic vein was identified. Right upper arm was prepped and draped in a sterile fashion. Tourniquet was placed. Skin was anesthetized with 1% lidocaine. 21 gauge needle directed into the right basilic vein with ultrasound guidance. Micropuncture dilator set was placed. Catheter aspirated and flushed well. IMPRESSION: Successful placement of a peripheral IV in the right upper arm. This is intended to be a temporary access that will be removed after the CTA procedure. Electronically Signed   By: Markus Daft M.D.   On: 03/28/2017 13:35   Ir Radiology Peripheral Guided Iv Start  Result Date: 03/28/2017 INDICATION: 76 year old with abdominal pain and being evaluated for mesenteric ischemia. Patient needs an abdominal CTA but does not have adequate venous access for the CTA examination. Patient is end-stage renal disease and not a candidate for PICC line. Patient has a left upper arm fistula which is maturing and a right jugular dialysis catheter.  EXAM: PLACEMENT OF PERIPHERAL IV WITH ULTRASOUND GUIDANCE MEDICATIONS: None ANESTHESIA/SEDATION: None FLUOROSCOPY TIME:  None). COMPLICATIONS: None immediate. PROCEDURE: Right upper arm was evaluated with ultrasound. A patent basilic vein was identified. Right upper arm was prepped and draped in a sterile fashion. Tourniquet was placed. Skin was anesthetized with 1% lidocaine. 21 gauge needle directed into the right basilic vein with ultrasound guidance. Micropuncture dilator set was placed. Catheter aspirated and flushed well. IMPRESSION: Successful placement of a peripheral IV in the right upper arm. This is intended to be a temporary access that will be removed after the CTA procedure. Electronically Signed   By: Markus Daft M.D.   On: 03/28/2017 13:35   US Abdominal Pelvic Art/vent Flow Doppler  Result Date: 03/26/2017 CLINICAL DATA:  76 year old female with possible mesenteric ischemia. End-stage renal disease Cardiovascular risk factors include hypertension, known prior coronary artery disease, known prior vascular disease with vascular surgery. EXAM: [place] MESENTERIC ARTERIAL DOPPLER COMPARISON:  Noncontrast CT 03/22/2017, 11/18/2016 FINDINGS: Celiac axis: 262 cm/sec Celiac axis with inspiration: 220 cm/sec Celiac axis with expiration: 262 cm/sec Splenic artery: Splenic artery not visualize Hepatic artery: Hepatic artery not visualized SMA: 433 cm/sec IMA: 375 cm/sec  IMPRESSION: Elevated velocities at the origin of the celiac artery, superior mesenteric artery, inferior mesenteric artery, compatible with greater than 70% stenosis at all 3 mesenteric vessels origin. Signed, Dulcy Fanny. Earleen Newport, DO [facility] Radiology Electronically Signed   By: Corrie Mckusick D.O.   On: 03/26/2017 10:46   Ct Angio Abd/pel W/ And/or W/o  Result Date: 03/28/2017 CLINICAL DATA:  Mesenteric ischemia EXAM: CTA ABDOMEN AND PELVIS wITHOUT AND WITH CONTRAST TECHNIQUE: Multidetector CT  imaging of the abdomen and pelvis was performed using the standard protocol during bolus administration of intravenous contrast. Multiplanar reconstructed images and MIPs were obtained and reviewed to evaluate the vascular anatomy. CONTRAST:  80 cc Isovue 370 COMPARISON:  03/22/2017 FINDINGS: VASCULAR Aorta: There are extensive atherosclerotic calcifications throughout the aorta. There is little if any soft plaque visualized in the lower thorax or abdomen. It is nonaneurysmal. Celiac: There is 70-80% narrowing at the origin likely due with to a combination of atherosclerosis an median arcuate ligament syndrome. Branch vessels are patent. SMA: There is 50% narrowing at the origin due to atherosclerotic plaque and calcification. Beyond the origin, it is patent. Branch vessels are grossly patent. Renals: Renal arteries are severely diminutive an atrophic. IMA: There is moderate narrowing at the origin. Branch vessels are patent. Inflow: Mild atherosclerotic calcifications in the right common and external iliac arteries without significant narrowing. Mild disease in the right internal iliac artery. Similarly, there is mild atherosclerotic calcification and some soft plaque in the left common iliac artery. The external iliac artery is pain. Internal iliac artery is patent. Proximal Outflow: The bilateral femoral arteries are grossly patent within the confines of the examination. There is some soft plaque in the right common femoral artery. There is also a small penetrating atherosclerotic ulcer. See table position -1017.3. Veins: Hepatic, portal, splenic, superior mesenteric, and bilateral renal veins are patent. IVC is patent. Bilateral common and external iliac veins are patent. Review of the MIP images confirms the above findings. NON-VASCULAR Lower chest: Dependent atelectasis bilaterally. Hepatobiliary: Subcentimeter hypodensity in the right lobe of the liver on image 20 is nonspecific. There is also a calcified  granuloma in the central liver. Gallbladder is decompressed causing artificial wall thickening. Pancreas: Unremarkable Spleen: Unremarkable Adrenals/Urinary Tract: Severe atrophy of the kidneys is present. The appearance is unchanged compare to the prior study. There is a cyst in the medial mid left kidney. There is an enhancing heterogeneous mass in the upper pole of the right kidney which is stable. Bladder is decompressed. Adrenal glands are within normal limits. Stomach/Bowel: There is a 2.2 x 2.2 cm gas and fluid filled abscess in the left upper quadrant between small bowel loops. No evidence of small-bowel obstruction. Inflammatory changes in the adjacent small bowel are improved. No focal mass of the colon. Diverticulosis throughout the length of the colon is noted. There are postoperative changes in the location of the cecum. There are also postoperative changes at the sigmoid rectum junction. Lymphatic: No abnormal retroperitoneal adenopathy. Reproductive: Uterus is absent.  Adnexa are unremarkable. Other: There is no free fluid in the pelvis. Musculoskeletal: No vertebral compression deformity. Renal osteodystrophy changes in the spine are present. IMPRESSION: VASCULAR Significant narrowing at the origin of the celiac axis. 50% narrowing at the origin of the SMA. Moderate narrowing at the origin of the IMA. NON-VASCULAR There is a 2.2 cm gas and fluid filled abscess within the left upper quadrant between small bowel loops. Stable right upper lobe renal mass. Please  see prior dictation for recommendations. Tiny hypodensity in the liver is nonspecific but likely benign. If patient has a history of malignancy, consider six-month follow-up MRI. Electronically Signed   By: Marybelle Killings M.D.   On: 03/28/2017 12:17    Labs:  CBC:  Recent Labs  03/23/17 0519 03/24/17 0518 03/25/17 0613 03/27/17 1315  WBC 5.3 6.1 6.4 4.7  HGB 10.8* 11.9* 11.3* 10.8*  HCT 33.9* 36.8 34.3* 33.5*  PLT 201 212 237 213      COAGS:  Recent Labs  09/07/16 0135  10/09/16 2325 01/24/17 1326 02/08/17 1205 03/28/17 0758  INR 0.96  < > 0.91 1.05 1.01 0.98  APTT 31  --   --  34 32 29  < > = values in this interval not displayed.  BMP:  Recent Labs  03/25/17 0613 03/26/17 0801 03/27/17 0241 03/27/17 1315  NA 131* 132* 129* 128*  K 4.9 4.5 5.4* 4.5  CL 94* 92* 95* 92*  CO2 27 27 25 27   GLUCOSE 103* 66 48* 122*  BUN 22* 23* 30* 35*  CALCIUM 8.1* 8.5* 7.9* 7.5*  CREATININE 6.08* 5.89* 7.82* 8.66*  GFRNONAA 6* 6* 4* 4*  GFRAA 7* 7* 5* 5*    LIVER FUNCTION TESTS:  Recent Labs  11/17/16 2206 01/24/17 1326 02/08/17 1205  03/22/17 0602 03/24/17 0518 03/26/17 0801 03/27/17 0241 03/27/17 1315  BILITOT 0.2* 0.7 0.4  --  0.5  --   --   --   --   AST 27 22 17   --  19  --   --   --   --   ALT 18 13* 10*  --  14  --   --   --   --   ALKPHOS 53 119 99  --  104  --   --   --   --   PROT 5.8* 7.3 5.3*  --  7.4  --   --   --   --   ALBUMIN 3.0* 3.9 2.9*  < > 4.0 3.1* 3.0* 2.9* 2.7*  < > = values in this interval not displayed.  Assessment and Plan: Abdominal pain Patient continues to tolerate diet well without complaint.  She denies fear of eating or difficulty eating- stating "I eat all the time, I love food."  Patient does have history of abdominal pain including gas, bloating, and constipation. She now has possible enteritis/diverticulitis with small abscess seen on imaging which is being treated with IV abx.   Results of her CTA yesterday show: -Significant narrowing at the origin of the celiac axis. 50% narrowing at the origin of the SMA. Moderate narrowing at the origin of the IMA. -There is a 2.2 cm gas and fluid filled abscess within the left upper quadrant between small bowel loops. Stable right upper lobe renal mass. Please see prior dictation for recommendations. Tiny hypodensity in the liver is nonspecific but likely benign. If patient has a history of malignancy, consider  six-month follow-up MRI.  Reviewed case and care plan with Dr. Kathlene Cote.   Patient with stable weight, good appetite and intake at this time.  She denies post-prandial pain at baseline, but does endorse that she does have occasional bloating and generalized abdominal discomfort which does not appear to associated with eating.  Given the demonstration of narrowing of her abdominal vascular, patient will be followed in the IR outpatient clinic.  Will have schedulers contact her with day and time of appointment.  Discussed this with patient who  verbalizes understanding and asks appropriate questions.  She does not smoke.  She is encouraged to continue eating per usual and to maintain her current weight.   Electronically Signed: Docia Barrier, PA 03/29/2017, 12:26 PM   I spent a total of 15 Minutes at the the patient's bedside AND on the patient's hospital floor or unit, greater than 50% of which was counseling/coordinating care for abdominal pain

## 2017-03-29 NOTE — Consult Note (Signed)
Cardiology Consult    Patient ID: Shawna [name] MRN: [id number], DOB/AGE: 1941/01/04   Admit date: 03/22/2017 Date of Consult: 03/29/2017  Primary Physician: [name], MD Primary Cardiologist: Bronson Ing Requesting Provider: Algis Liming Reason for Consultation: Chest pain  Shawna [name] is a 76 y.o. female who is being seen today for the evaluation of chest pain at the request of Dr. Algis Liming.   Patient Profile    76 yo female with PMH of ESRD on HD, CAD 4v s/p CABG and PCI to diag (4/18), HTN, HL, Chronic diastolic HF, anemia, PUD, and TIA who presented with abd pain. Developed brief episode of chest pain while admitted.   Past Medical History   Past Medical History:  Diagnosis Date  . Acute on chronic respiratory failure with hypoxia ([place]) 10/10/2016  . Anxiety   . Arthritis   . AVM (arteriovenous malformation) of colon   . CAD (coronary artery disease)    a. 12/2011 NSTEMI/Cath/PCI LCX (2.25x14 Resolute DES) & D1 (2.25x22 Resolute DES);  b. 01/2012 Cath/PCI: LM 30, LAD 30p, 40-29m, D1 stent ok, 99 in sm branch of diag, LCX patent stent, OM1 20, RCA 95 ost (4.0x12 Promus DES), EF 55%;  c. 04/2012 Lexi Cardiolite  EF 48%, small area of scar @ base/mid inflat wall with mild peri-infarct ischemia.; CABG 12/4  . Carotid artery disease ([place])    a. 30-94% LICA, 0/7680   . Chronic bronchitis ([place])   . Chronic diastolic CHF (congestive heart failure) ([place])    a. 02/2012 Echo EF 60-65%, nl wall motion, Gr 1 DD, mod MR  . Colon cancer ([place]) 1992  . Esophageal stricture   . ESRD on hemodialysis ([place])    ESRD due to HTN, started dialysis 2011 and gets HD at [facility] with Dr Hinda Lenis on MWF schedule.  Access is LUA AVF as of Sept 2014.   Marland Kitchen GERD (gastroesophageal reflux disease)   . High cholesterol 12/2011  . History of blood transfusion 07/2011; 12/2011; 01/2012 X 2; 04/2012  . History of gout   . History of lower GI bleeding   . Hypertension   . Iron deficiency anemia   . Mitral  regurgitation    a. Moderate by echo, 02/2012  . Myocardial infarction ([place])   . Ovarian cancer ([place]) 1992  . Pneumonia ~ 2009  . PUD (peptic ulcer disease)   . TIA (transient ischemic attack)     Past Surgical History:  Procedure Laterality Date  . ABDOMINAL HYSTERECTOMY  1992  . APPENDECTOMY  06/1990  . AV FISTULA PLACEMENT  07/2009   left upper arm  . AV FISTULA PLACEMENT Right 09/06/2016   Procedure: RIGHT FOREARM ARTERIOVENOUS (AV) GRAFT;  Surgeon: Elam Dutch, MD;  Location: [facility] OR;  Service: Vascular;  Laterality: Right;  . AV FISTULA PLACEMENT N/A 02/24/2017   Procedure: INSERTION OF ARTERIOVENOUS (AV) GORE-TEX GRAFT ARM (BRACHIAL AXILLARY);  Surgeon: Katha Cabal, MD;  Location: [facility] ORS;  Service: Vascular;  Laterality: N/A;  . [place] Right 09/06/2016   Procedure: REMOVAL OF Right Arm ARTERIOVENOUS GORETEX GRAFT and Vein Patch angioplasty of brachial artery;  Surgeon: Angelia Mould, MD;  Location: [place];  Service: Vascular;  Laterality: Right;  . COLON RESECTION  1992  . COLON SURGERY    . CORONARY ANGIOPLASTY WITH STENT PLACEMENT  12/15/11   "2"  . CORONARY ANGIOPLASTY WITH STENT PLACEMENT  y/2013   "1; makes total of 3" (05/02/2012)  . CORONARY ARTERY BYPASS GRAFT  06/13/2012   Procedure: CORONARY ARTERY BYPASS GRAFTING (CABG);  Surgeon: Grace Isaac, MD;  Location: [place];  Service: Open Heart Surgery;  Laterality: N/A;  cabg x four;  using left internal mammary artery, and left leg greater saphenous vein harvested endoscopically  . CORONARY STENT INTERVENTION N/A 10/13/2016   Procedure: Coronary Stent Intervention;  Surgeon: Troy Sine, MD;  Location: [place] CV LAB;  Service: Cardiovascular;  Laterality: N/A;  . DILATION AND CURETTAGE OF UTERUS    . ESOPHAGOGASTRODUODENOSCOPY  01/20/2012   Procedure: ESOPHAGOGASTRODUODENOSCOPY (EGD);  Surgeon: Ladene Artist, MD,FACG;  Location: [facility] ENDOSCOPY;  Service: Endoscopy;  Laterality: N/A;  .  ESOPHAGOGASTRODUODENOSCOPY N/A 03/26/2013   Procedure: ESOPHAGOGASTRODUODENOSCOPY (EGD);  Surgeon: Irene Shipper, MD;  Location: [facility] ENDOSCOPY;  Service: Endoscopy;  Laterality: N/A;  . ESOPHAGOGASTRODUODENOSCOPY N/A 04/30/2015   Procedure: ESOPHAGOGASTRODUODENOSCOPY (EGD);  Surgeon: Rogene Houston, MD;  [facility]O [facility];  Service: Endoscopy;  Laterality: N/A;  1pm - moved to 10/20 @ 1:10  . ESOPHAGOGASTRODUODENOSCOPY N/A 07/29/2016   Procedure: ESOPHAGOGASTRODUODENOSCOPY (EGD);  Surgeon: Manus Gunning, MD;  Location: [place];  Service: Gastroenterology;  Laterality: N/A;  enteroscopy  . INTRAOPERATIVE TRANSESOPHAGEAL ECHOCARDIOGRAM  06/13/2012   Procedure: INTRAOPERATIVE TRANSESOPHAGEAL ECHOCARDIOGRAM;  Surgeon: Grace Isaac, MD;  Location: [place];  Service: Open Heart Surgery;  Laterality: N/A;  . IR GENERIC HISTORICAL  07/26/2016   IR FLUORO GUIDE CV LINE RIGHT 07/26/2016 Greggory Keen, MD [facility]  . IR GENERIC HISTORICAL  07/26/2016   IR US GUIDE VASC ACCESS RIGHT 07/26/2016 Greggory Keen, MD [facility]  . IR GENERIC HISTORICAL  08/02/2016   IR US GUIDE VASC ACCESS RIGHT 08/02/2016 Greggory Keen, MD [facility]  . IR GENERIC HISTORICAL  08/02/2016   IR FLUORO GUIDE CV LINE RIGHT 08/02/2016 Greggory Keen, MD [facility]  . IR RADIOLOGY PERIPHERAL GUIDED IV START  03/28/2017  . IR US GUIDE VASC ACCESS RIGHT  03/28/2017  . LEFT HEART CATH AND CORONARY ANGIOGRAPHY N/A 09/20/2016   Procedure: Left Heart Cath and Coronary Angiography;  Surgeon: [name], MD;  Location: [place] CV LAB;  Service: Cardiovascular;  Laterality: N/A;  . LEFT HEART CATH AND CORS/GRAFTS ANGIOGRAPHY N/A 10/13/2016   Procedure: Left Heart Cath and Cors/Grafts Angiography;  Surgeon: Troy Sine, MD;  Location: [place] CV LAB;  Service: Cardiovascular;  Laterality: N/A;  . LEFT HEART CATHETERIZATION WITH CORONARY ANGIOGRAM N/A 12/15/2011   Procedure: LEFT HEART CATHETERIZATION WITH  CORONARY ANGIOGRAM;  Surgeon: Burnell Blanks, MD;  Location: [facility] CATH LAB;  Service: Cardiovascular;  Laterality: N/A;  . LEFT HEART CATHETERIZATION WITH CORONARY ANGIOGRAM N/A 01/10/2012   Procedure: LEFT HEART CATHETERIZATION WITH CORONARY ANGIOGRAM;  Surgeon: Pocahontas Cohenour M Martinique, MD;  Location: [facility] CATH LAB;  Service: Cardiovascular;  Laterality: N/A;  . LEFT HEART CATHETERIZATION WITH CORONARY ANGIOGRAM N/A 06/08/2012   Procedure: LEFT HEART CATHETERIZATION WITH CORONARY ANGIOGRAM;  Surgeon: Burnell Blanks, MD;  Location: [facility] CATH LAB;  Service: Cardiovascular;  Laterality: N/A;  . LEFT HEART CATHETERIZATION WITH CORONARY/GRAFT ANGIOGRAM N/A 12/10/2013   Procedure: LEFT HEART CATHETERIZATION WITH Beatrix Fetters;  Surgeon: [name], MD;  Location: [facility] CATH LAB;  Service: Cardiovascular;  Laterality: N/A;  . OVARY SURGERY     ovarian cancer  . REVISION OF ARTERIOVENOUS GORETEX GRAFT N/A 02/24/2017   Procedure: REVISION OF ARTERIOVENOUS GORETEX GRAFT (RESECTION);  Surgeon: Katha Cabal, MD;  Location: [facility] ORS;  Service: Vascular;  Laterality: N/A;  .  SHUNTOGRAM N/A 10/15/2013   Procedure: Fistulogram;  Surgeon: Serafina Mitchell, MD;  Location: [facility] CATH LAB;  Service: Cardiovascular;  Laterality: N/A;  . THROMBECTOMY / ARTERIOVENOUS GRAFT REVISION  2011   left upper arm  . TUBAL LIGATION  1980's  . UPPER EXTREMITY ANGIOGRAPHY Bilateral 12/06/2016   Procedure: Upper Extremity Angiography;  Surgeon: Katha Cabal, MD;  Location: [place] CV LAB;  Service: Cardiovascular;  Laterality: Bilateral;     Allergies  Allergies  Allergen Reactions  . Aspirin Other (See Comments)    High Doses Mess up her stomach; "makes my bowels have blood in them". Takes 81 mg EC Aspirin   . Penicillins Other (See Comments)    SYNCOPE? , "makes me real weak when I take it; like I'll pass out"  Has patient had a PCN reaction causing immediate rash, facial/tongue/throat swelling,  SOB or lightheadedness with hypotension: Yes Has patient had a PCN reaction causing severe rash involving mucus membranes or skin necrosis: no Has patient had a PCN reaction that required hospitalization no Has patient had a PCN reaction occurring within the last 10 years: no If all of the above  . Amlodipine Swelling  . Bactrim [Sulfamethoxazole-Trimethoprim] Rash  . Contrast Media [Iodinated Diagnostic Agents] Itching  . Iron Itching and Other (See Comments)    "they gave me iron in dialysis; had to give me Benadryl cause I had to have the iron" (05/02/2012)  . Nitrofurantoin Hives  . Tylenol [Acetaminophen] Itching and Other (See Comments)    Makes her feet on fire per pt  . Gabapentin Other (See Comments)    unspecified  . Dexilant [Dexlansoprazole] Other (See Comments)    Upset stomach  . Levaquin [Levofloxacin In D5w] Rash  . Morphine And Related Itching    Itching in feet  . Plavix [Clopidogrel Bisulfate] Rash  . Protonix [Pantoprazole Sodium] Rash  . Venofer [Ferric Oxide] Itching    Patient reports using Benadryl prior to doses as [place]    History of Present Illness    Shawna [name] is a 76 yo female with PMH of ESRD on HD, CAD 4v s/p CABG and PCI to diag (4/18), HTN, HL, Chronic diastolic HF, anemia, PUD, and TIA. She last cath was 4/18 noting patent grafts, with PCI/DESx to diag. Placed on DAPT with ASA/Brilinta and tolerated well. Was admitted back in 8/18 with Code STEMI that was canceled. Troponins cycled 0.03 x3 with no recurrent symptoms. Noted to be anemic, transfused and underwent dialysis. Known hx of difficultly controlling HTN, on multiple agents.   States she has been doing well until recently when she developed a sudden onset of abd pain prompting her to present to the [place]. CT showed focal enteritis/diverticulitis with possible perforation. Seen by GI and general surgery with plans for conservative management with bowel rest and IV antibiotics initially.  Had abd doppler that showed 70% stenosis of 3 mesenteric vessels. IR consulted and follow up CT showed mesenteric stenosis but not enough to require stenting per IR. Patient reports this morning while getting up to get ready for dialysis she developed back pain with radiation into her chest. States she felt mildly short of breath with this and asked the RN for SL nitro. Resolved after 1 nitro. No further episodes. EKG showed SR with significant LVH and repol noted on previous EKGs. Troponin ordered by primary but not resulted.   Inpatient Medications    . amiodarone  200 mg Oral Daily  . aspirin EC  81 mg Oral Daily  . bisacodyl  10 mg Rectal q12n4p  . carvedilol  6.25 mg Oral BID WC  . cinacalcet  30 mg Oral QPC supper  . doxercalciferol  2.5 mcg Intravenous Q M,W,F-HD  . famotidine  20 mg Oral QODAY  . heparin  5,000 Units Subcutaneous Q8H  . isosorbide mononitrate  120 mg Oral Daily  . lidocaine-prilocaine   Topical Q M,W,F-HD  . multivitamin  1 tablet Oral QHS  . sevelamer carbonate  800 mg Oral TID WC  . ticagrelor  90 mg Oral BID    Family History    Family History  Problem Relation Age of Onset  . Heart disease Mother        Heart Disease before age 5  . Hyperlipidemia Mother   . Hypertension Mother   . Diabetes Mother   . Heart attack Mother   . Heart disease Father        Heart Disease before age 6  . Hyperlipidemia Father   . Hypertension Father   . Diabetes Father   . Diabetes Sister   . Hypertension Sister   . Diabetes Brother   . Hyperlipidemia Brother   . Heart attack Brother   . Hypertension Sister   . Heart attack Brother   . Other Unknown        noncontributory for early CAD  . Colon cancer Neg Hx   . Esophageal cancer Neg Hx   . Liver disease Neg Hx   . Kidney disease Neg Hx   . Colon polyps Neg Hx     Social History    Social History   Social History  . Marital status: Married    Spouse name: N/A  . Number of children: N/A  . Years of  education: N/A   Occupational History  . Not on file.   Social History Main Topics  . Smoking status: Never Smoker  . Smokeless tobacco: Never Used  . Alcohol use No  . Drug use: No  . Sexual activity: Yes    Birth control/ protection: Surgical   Other Topics Concern  . Not on file   Social History Narrative   Lives in [place], [place] with husband.  Dialysis pt - [facility].     Review of Systems    See HPI All other systems reviewed and are otherwise negative except as noted above.  Physical Exam    Blood pressure (!) 189/65, pulse 71, temperature 98 F (36.7 C), temperature source Oral, resp. rate 19, height 5\' 2"  (1.575 m), weight 123 lb 3.8 oz (55.9 kg), SpO2 100 %.  General: Pleasant, NAD Psych: Normal affect. Neuro: Alert and oriented X 3. Moves all extremities spontaneously. HEENT: Normal  Neck: Supple without bruits or JVD. Lungs:  Resp regular and unlabored, CTA. HD cath noted.  Heart: RRR no s3, s4, soft systolic murmurs. Abdomen: Soft, non-tender, non-distended, BS + x 4.  Extremities: No clubbing, cyanosis or edema. DP/PT/Radials 2+ and equal bilaterally.  Labs    Troponin (Point of Care Test) No results for input(s): TROPIPOC in the last 72 hours. No results for input(s): CKTOTAL, CKMB, TROPONINI in the last 72 hours. Lab Results  Component Value Date   WBC 4.7 03/27/2017   HGB 10.8 (L) 03/27/2017   HCT 33.5 (L) 03/27/2017   MCV 99.1 03/27/2017   PLT 213 03/27/2017    Recent Labs Lab 03/27/17 1315  NA 128*  K 4.5  CL 92*  CO2 27  BUN  35*  CREATININE 8.66*  CALCIUM 7.5*  GLUCOSE 122*   Lab Results  Component Value Date   CHOL 106 02/08/2017   HDL 52 02/08/2017   LDLCALC 31 02/08/2017   TRIG 113 02/08/2017   Lab Results  Component Value Date   DDIMER 1.97 (H) 07/03/2012     Radiology Studies    Ct Abdomen Pelvis Wo Contrast  Result Date: 03/22/2017 CLINICAL DATA:  76 y/o F; generalized abdominal pain and abdominal distention. EXAM: CT  ABDOMEN AND PELVIS WITHOUT CONTRAST TECHNIQUE: Multidetector CT imaging of the abdomen and pelvis was performed following the standard protocol without IV contrast. COMPARISON:  11/18/2016 CT of abdomen and pelvis. FINDINGS: Lower chest: Mild cardiomegaly. Severe coronary artery calcification. Left lower lobe platelike atelectasis. Hepatobiliary: No focal liver abnormality. Cholelithiasis. No intra or extrahepatic biliary ductal dilatation. Pancreas: Unremarkable. No pancreatic ductal dilatation or surrounding inflammatory changes. Spleen: Normal in size without focal abnormality. Adrenals/Urinary Tract: Normal adrenal glands. Right upper kidney mass measuring 2.9 cm and left kidney interpolar mass measuring 1.6 cm are stable. Severe atrophy of kidneys. No hydronephrosis. Bladder is collapsed. Stomach/Bowel: Patent rectosigmoid and cecal region anastomosis. Pancolonic diverticulosis. The short segment of small bowel inflammatory changes with edema extending into the mesentery centered around a small focus of air extending beyond the wall of the small bowel (series 5, image 29) which may represent a contained perforation or inflamed small bowel diverticulum. No bowel obstruction. Vascular/Lymphatic: Aortic atherosclerosis with severe calcific. No enlarged abdominal or pelvic lymph nodes. Reproductive: Status post hysterectomy. No adnexal masses. Other: Small volume of ascites within the left hemiabdomen. Musculoskeletal: No fracture is seen. IMPRESSION: 1. Short segment of inflamed small bowel surrounding a small focus of air extending beyond the bowel wall which may represent a contained perforation or small bowel diverticulitis. Trace ascites is likely reactive. 2. Mild cardiomegaly and severe coronary artery calcification. 3. Cholelithiasis. 4. Stable right kidney upper pole 2.9 cm indeterminate mass and left kidney interpolar 1.6 cm indeterminate mass. Renal protocol CT with and without contrast is recommended to  further characterize. This recommendation follows ACR consensus guidelines: Management of the Incidental Renal Mass on CT: A White Paper of the ACR Incidental Findings Committee. J Am Coll Radiol [phone]. 5. Pan colonic diverticulosis. Electronically Signed   By: Kristine Garbe M.D.   On: 03/22/2017 06:47   Dg Chest 2 View  Result Date: 03/29/2017 CLINICAL DATA:  Shortness of breath with cough. EXAM: CHEST  2 VIEW COMPARISON:  03/22/2017. FINDINGS: The heart is enlarged. There is coronary artery calcification and thoracic atherosclerosis. There is no consolidation or edema. Dual lumen catheter unchanged, tips proximal RIGHT atrium. IMPRESSION: Stable chest. Electronically Signed   By: Staci Righter M.D.   On: 03/29/2017 09:33   US Venous Img Upper Uni Left  Result Date: 03/23/2017 CLINICAL DATA:  Left hand pain since yesterday. Left hand turning cold for 2 weeks. Patient has a Gore-Tex graft in the lower arm and antecubital fossa region. EXAM: LEFT UPPER EXTREMITY VENOUS DOPPLER ULTRASOUND TECHNIQUE: Gray-scale sonography with graded compression, as well as color Doppler and duplex ultrasound were performed to evaluate the upper extremity deep venous system from the level of the subclavian vein and including the jugular, axillary, basilic, radial, ulnar and upper cephalic vein. Spectral Doppler was utilized to evaluate flow at rest and with distal augmentation maneuvers. COMPARISON:  None. FINDINGS: Contralateral Subclavian Vein: Respiratory phasicity is normal and symmetric with the symptomatic side. No evidence of thrombus. Normal compressibility. Internal  Jugular Vein: No evidence of thrombus. Normal compressibility, respiratory phasicity and response to augmentation. Subclavian Vein: No evidence of thrombus. Normal compressibility, respiratory phasicity and response to augmentation. Axillary Vein: No evidence of thrombus. Normal compressibility, respiratory phasicity and response to  augmentation. Cephalic Vein: Irregular hypoechoic to mildly hyperechoic area in the left forearm near the patient's dialysis graft that may reflect a chronically thrombosed cephalic vein. Basilic Vein: No evidence of thrombus. Normal compressibility, respiratory phasicity and response to augmentation. Brachial Veins: No evidence of thrombus. Normal compressibility, respiratory phasicity and response to augmentation. Radial Veins: No evidence of thrombus. Normal compressibility, respiratory phasicity and response to augmentation. Ulnar Veins: No evidence of thrombus. Normal compressibility, respiratory phasicity and response to augmentation. Venous Reflux:  None visualized. Other Findings:  The dialysis fistula is widely patent. IMPRESSION: 1. Probable chronic thrombosis in the cephalic vein in the patient's dialysis fistula in the left arm. 2. Remaining veins are widely patent. Dialysis fistula is widely patent. Electronically Signed   By: Lajean Manes M.D.   On: 03/23/2017 15:55   Ir US Guide Vasc Access Right  Result Date: 03/28/2017 INDICATION: 76 year old with abdominal pain and being evaluated for mesenteric ischemia. Patient needs an abdominal CTA but does not have adequate venous access for the CTA examination. Patient is end-stage renal disease and not a candidate for PICC line. Patient has a left upper arm fistula which is maturing and a right jugular dialysis catheter. EXAM: PLACEMENT OF PERIPHERAL IV WITH ULTRASOUND GUIDANCE MEDICATIONS: None ANESTHESIA/SEDATION: None FLUOROSCOPY TIME:  None). COMPLICATIONS: None immediate. PROCEDURE: Right upper arm was evaluated with ultrasound. A patent basilic vein was identified. Right upper arm was prepped and draped in a sterile fashion. Tourniquet was placed. Skin was anesthetized with 1% lidocaine. 21 gauge needle directed into the right basilic vein with ultrasound guidance. Micropuncture dilator set was placed. Catheter aspirated and flushed well.  IMPRESSION: Successful placement of a peripheral IV in the right upper arm. This is intended to be a temporary access that will be removed after the CTA procedure. Electronically Signed   By: Markus Daft M.D.   On: 03/28/2017 13:35   Dg Abdomen Acute W/chest  Result Date: 03/22/2017 CLINICAL DATA:  Diffuse abdominal pain. EXAM: DG ABDOMEN ACUTE W/ 1V CHEST COMPARISON:  Radiograph 02/08/2017.  CT 11/18/2016 FINDINGS: Right-sided dialysis catheter with tip in the distal SVC. Cardiomegaly. Post median sternotomy. Bibasilar atelectasis, right greater than left. No pulmonary edema or pleural fluid. No free intra-abdominal air. No bowel dilatation to suggest obstruction. Scattered retained barium within colonic diverticula. Multiple enteric sutures and surgical clips in the pelvis. Small volume of colonic stool. IMPRESSION: 1. Normal bowel gas pattern. No free air. Retained barium within colonic diverticulum. 2. Mild bibasilar atelectasis.  Mild cardiomegaly. Electronically Signed   By: Jeb Levering M.D.   On: 03/22/2017 06:00   Ir Radiology Peripheral Guided Iv Start  Result Date: 03/28/2017 INDICATION: 76 year old with abdominal pain and being evaluated for mesenteric ischemia. Patient needs an abdominal CTA but does not have adequate venous access for the CTA examination. Patient is end-stage renal disease and not a candidate for PICC line. Patient has a left upper arm fistula which is maturing and a right jugular dialysis catheter. EXAM: PLACEMENT OF PERIPHERAL IV WITH ULTRASOUND GUIDANCE MEDICATIONS: None ANESTHESIA/SEDATION: None FLUOROSCOPY TIME:  None). COMPLICATIONS: None immediate. PROCEDURE: Right upper arm was evaluated with ultrasound. A patent basilic vein was identified. Right upper arm was prepped and draped in a sterile fashion. Tourniquet was  placed. Skin was anesthetized with 1% lidocaine. 21 gauge needle directed into the right basilic vein with ultrasound guidance. Micropuncture dilator set  was placed. Catheter aspirated and flushed well. IMPRESSION: Successful placement of a peripheral IV in the right upper arm. This is intended to be a temporary access that will be removed after the CTA procedure. Electronically Signed   By: Markus Daft M.D.   On: 03/28/2017 13:35   US Abdominal Pelvic Art/vent Flow Doppler  Result Date: 03/26/2017 CLINICAL DATA:  76 year old female with possible mesenteric ischemia. End-stage renal disease Cardiovascular risk factors include hypertension, known prior coronary artery disease, known prior vascular disease with vascular surgery. EXAM: [place] MESENTERIC ARTERIAL DOPPLER COMPARISON:  Noncontrast CT 03/22/2017, 11/18/2016 FINDINGS: Celiac axis: 262 cm/sec Celiac axis with inspiration: 220 cm/sec Celiac axis with expiration: 262 cm/sec Splenic artery: Splenic artery not visualize Hepatic artery: Hepatic artery not visualized SMA: 433 cm/sec IMA: 375 cm/sec IMPRESSION: Elevated velocities at the origin of the celiac artery, superior mesenteric artery, inferior mesenteric artery, compatible with greater than 70% stenosis at all 3 mesenteric vessels origin. Signed, Dulcy Fanny. Earleen Newport, DO [facility] Electronically Signed   By: Corrie Mckusick D.O.   On: 03/26/2017 10:46   Ct Angio Abd/pel W/ And/or W/o  Result Date: 03/28/2017 CLINICAL DATA:  Mesenteric ischemia EXAM: CTA ABDOMEN AND PELVIS wITHOUT AND WITH CONTRAST TECHNIQUE: Multidetector CT imaging of the abdomen and pelvis was performed using the standard protocol during bolus administration of intravenous contrast. Multiplanar reconstructed images and MIPs were obtained and reviewed to evaluate the vascular anatomy. CONTRAST:  80 cc Isovue 370 COMPARISON:  03/22/2017 FINDINGS: VASCULAR Aorta: There are extensive atherosclerotic calcifications throughout the aorta. There is little if any soft plaque visualized in the lower thorax or abdomen. It is nonaneurysmal.  Celiac: There is 70-80% narrowing at the origin likely due with to a combination of atherosclerosis an median arcuate ligament syndrome. Branch vessels are patent. SMA: There is 50% narrowing at the origin due to atherosclerotic plaque and calcification. Beyond the origin, it is patent. Branch vessels are grossly patent. Renals: Renal arteries are severely diminutive an atrophic. IMA: There is moderate narrowing at the origin. Branch vessels are patent. Inflow: Mild atherosclerotic calcifications in the right common and external iliac arteries without significant narrowing. Mild disease in the right internal iliac artery. Similarly, there is mild atherosclerotic calcification and some soft plaque in the left common iliac artery. The external iliac artery is pain. Internal iliac artery is patent. Proximal Outflow: The bilateral femoral arteries are grossly patent within the confines of the examination. There is some soft plaque in the right common femoral artery. There is also a small penetrating atherosclerotic ulcer. See table position -1017.3. Veins: Hepatic, portal, splenic, superior mesenteric, and bilateral renal veins are patent. IVC is patent. Bilateral common and external iliac veins are patent. Review of the MIP images confirms the above findings. NON-VASCULAR Lower chest: Dependent atelectasis bilaterally. Hepatobiliary: Subcentimeter hypodensity in the right lobe of the liver on image 20 is nonspecific. There is also a calcified granuloma in the central liver. Gallbladder is decompressed causing artificial wall thickening. Pancreas: Unremarkable Spleen: Unremarkable Adrenals/Urinary Tract: Severe atrophy of the kidneys is present. The appearance is unchanged compare to the prior study. There is a cyst in the medial mid left kidney. There is an enhancing heterogeneous mass in the upper pole of the right kidney which is stable. Bladder is decompressed. Adrenal glands are within normal limits. Stomach/Bowel:  There is a 2.2 x 2.2 cm gas and fluid filled abscess in the left upper quadrant between small bowel loops. No evidence of small-bowel obstruction. Inflammatory changes in the adjacent small bowel are improved. No focal mass of the colon. Diverticulosis throughout the length of the colon is noted. There are postoperative changes in the location of the cecum. There are also postoperative changes at the sigmoid rectum junction. Lymphatic: No abnormal retroperitoneal adenopathy. Reproductive: Uterus is absent.  Adnexa are unremarkable. Other: There is no free fluid in the pelvis. Musculoskeletal: No vertebral compression deformity. Renal osteodystrophy changes in the spine are present. IMPRESSION: VASCULAR Significant narrowing at the origin of the celiac axis. 50% narrowing at the origin of the SMA. Moderate narrowing at the origin of the IMA. NON-VASCULAR There is a 2.2 cm gas and fluid filled abscess within the left upper quadrant between small bowel loops. Stable right upper lobe renal mass. Please see prior dictation for recommendations. Tiny hypodensity in the liver is nonspecific but likely benign. If patient has a history of malignancy, consider six-month follow-up MRI. Electronically Signed   By: Marybelle Killings M.D.   On: 03/28/2017 12:17    ECG & Cardiac Imaging    EKG: SR with LVH and repol  Echo: 10/12/16  Study Conclusions  - Left ventricle: The cavity size was normal. There was moderate   concentric hypertrophy. Systolic function was normal. The   estimated ejection fraction was in the range of 50% to 55%. Wall   motion was normal; there were no regional wall motion   abnormalities. Doppler parameters are consistent with abnormal   left ventricular relaxation (grade 1 diastolic dysfunction).   Doppler parameters are consistent with elevated ventricular   end-diastolic filling pressure. - Aortic valve: Trileaflet; normal thickness leaflets. There was no   regurgitation. - Aortic root: The  aortic root was normal in size. - Mitral valve: Calcified annulus. Mildly thickened leaflets .   There was mild regurgitation. - Left atrium: The atrium was normal in size. - Right ventricle: Systolic function was normal. - Tricuspid valve: There was no regurgitation. - Pulmonary arteries: Systolic pressure could not be accurately   estimated. - Inferior vena cava: The vessel was normal in size. - Pericardium, extracardiac: There was no pericardial effusion.  Assessment & Plan    76 yo female with PMH of ESRD on HD, CAD 4v s/p CABG and PCI to diag (4/18), HTN, HL, Chronic diastolic HF, anemia, PUD, and TIA who presented with abd pain. Developed brief episode of chest pain while admitted.   1. Chest pain: Developed a brief episode of chest pain this morning while up in her room. Relived by 1 SL nitro. EKG with nonacute. No further symptoms. Troponin ordered, but not resulted. Known hx of CAD, but symptoms have been stable prior to admission. F/u troponin, but suspect no further work up if negative.   2. Abd pain/renal mass: Undergoing work per primary.   3. HTN: difficulty controlling blood pressures historically. On multiple agents. Continue on current therapy  4. CAD s/p CABG and PCI: on DAPT, BB, statin (at home) and Imdur  5. Chronic diastolic HF: appears volume stable on exam. Nephrology following for HD  6. ERSD on HD: Nephrology following  7. Chronic anemia: Hgb stable this admission   Barnet Pall, NP-C Pager [phone] 03/29/2017, 2:11 PM   Patient seen and examined and history reviewed. Agree with above findings and plan. 76 yo BF well known to our service. Admitted  with abdominal pain. CT shows celiac and mesenteric stenoses. Abdominal pain has resolved. Patient did have an episode of chest pain this am. Started in her back and radiated forward. Lasted 10 minutes and relieved with sl Ntg. This is typical for her angina and not unusual. Usually gets this when  anxious or in a hurry. She is currently being dialysed. Exam reveals functioning AV fistula. No gallop, rub, or murmur. Lungs are clear. No edema  Ecg with LVH and repolarization abnormality  Impression: chronic stable angina. Has her typical pattern. Relieved with sl Ntg.. Would not pursue further work up at this time. Continue prior cardiac meds.   Parrish Bonn [place], [place] 03/29/2017 3:04 PM

## 2017-03-29 NOTE — Progress Notes (Signed)
0625 C/0 chest pain while bathing /rushing felt chest pain .Obtained  ekg  And placed 02/2l and notified NP. Orders received 1 sl nitro administered.Repeated vitals 5 min 171/57  68  19 98.9.

## 2017-03-30 DIAGNOSIS — E162 Hypoglycemia, unspecified: Secondary | ICD-10-CM

## 2017-03-30 LAB — GLUCOSE, CAPILLARY
GLUCOSE-CAPILLARY: 53 mg/dL — AB (ref 65–99)
GLUCOSE-CAPILLARY: 108 mg/dL — AB (ref 65–99)
Glucose-Capillary: 208 mg/dL — ABNORMAL HIGH (ref 65–99)
Glucose-Capillary: 72 mg/dL (ref 65–99)
Glucose-Capillary: 94 mg/dL (ref 65–99)

## 2017-03-30 MED ORDER — DEXTROSE 50 % IV SOLN
INTRAVENOUS | Status: AC
  Administered 2017-03-30: 50 mL
  Filled 2017-03-30: qty 50

## 2017-03-30 MED ORDER — DARBEPOETIN ALFA 60 MCG/0.3ML IJ SOSY
60.0000 ug | PREFILLED_SYRINGE | INTRAMUSCULAR | Status: DC
  Administered 2017-03-31: 60 ug via INTRAVENOUS
  Filled 2017-03-30: qty 0.3

## 2017-03-30 MED ORDER — DEXTROSE 5 % IV SOLN
INTRAVENOUS | Status: DC
  Administered 2017-03-30: 08:00:00 via INTRAVENOUS

## 2017-03-30 NOTE — Progress Notes (Signed)
Hypoglycemic Event  CBG: Results for Shawna [name], Shawna [name] (MRN [id number]) as of 03/30/2017 02:36  Ref. Range 03/30/2017 02:11  Glucose-Capillary Latest Ref Range: 65 - 99 mg/dL 53 (L)    Treatment: D50 IV 25 mL and D50 IV 50 mL  Symptoms: None  Follow-up CBG: Time: CBG Result Results for Shawna [name], Shawna [name] (MRN [id number]) as of 03/30/2017 03:22  Ref. Range 03/30/2017 02:45  Glucose-Capillary Latest Ref Range: 65 - 99 mg/dL 208 (H)   Possible Reasons for Event: Inadequate meal intake  Comments/MD notified:    Viviano Simas

## 2017-03-30 NOTE — Progress Notes (Signed)
PROGRESS NOTE   Shawna [name]  LKT:[id number]    DOB: [birth year]    DOA: 03/22/2017  PCP: [name], MD   I have briefly reviewed patients previous medical records in [facility].  Brief Narrative:  76 year old married female, PMH of ESRD on MWF HD (Dr. Lowanda Foster, Nephrology), CAD/NSTEMI/PCI, chronic diastolic CHF (Dr. 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 CT revealed focal enteritis-diverticulitis and question of contained perforation. GI and general surgery consulted and initially managed conservatively for presumed enteritis with bowel rest and IV antibiotics. Due to ongoing postprandial abdominal pain, suspected bowel ischemia and underwent an abdominal Doppler which showed more than 70% stenosis of all 3 mesenteric vessels at their origins. GI/Gen. surgery discussed with IR at [facility] and patient was transferred over on 9/16 for for possible stenting of mesenteric vessels. IR and nephrology consulted at [facility].CT abdomen 9/18 showed mesenteric vessel stenosis but not enough to require stenting as IR, did reveal new intra-abdominal abscess, Gen. surgery consulted. Intermittent chest pain since 9/18, Cardiology states chronic stable angina and signed off.   Assessment & Plan:   Active Problems:   SBO (small bowel obstruction) (HCC)   Enteritis   Diverticulitis   Mesenteric ischemia ([place])   1. Abdominal pain suspected due to mesenteric ischemia & small bowel enteritis/Abscess: History as outlined above. Initially managed conservatively for possible enteritis/diverticulitis with bowel rest and IV ertapenem. Due to ongoing postprandial abdominal pain, underwent abdominal Doppler ultrasound at [facility](unable to get CTA abdomen due to no IV access for dye administration) which showed more than 70% stenosis at all 3  mesenteric vessel origins. Transferred from [facility] to [facility] on 9/16 for evaluation by interventional radiology for possible intervention/stenting. S/P CTA abdomen 9/18 and as per IR input, no evidence of significant intestinal angina clinically or acute mesenteric ischemia. IR will follow-up in outpatient clinic in event symptoms progress to more convincing intestinal angina. CTA abdomen also showed 2.2 cm gas and fluid-filled abscess within the left upper quadrant between small bowel loops, not amenable to percutaneous drainage as per my discussion with IR on 9/18. General surgery consulted and managed conservatively with bowel rest, complete total 10 days of IV antibiotics (on ertapenem since 9/14). Confusion likely related to prednisone is better. GI issues improving. Surgery following. Advancing to full liquid diet today and if tolerates then advance as tolerated. 2. ESRD on MWF HD: Nephrology was consulting for dialysis needs at [facility]. Ongoing HD per nephrology. 3. Hyperkalemia: Secondary to ESRD in dialysis patient. Resolved after HD on 9/17. 4. Hyponatremia: Likely related to ESRD and management per nephrology. Stable. 5. Secondary hyperparathyroidism: Per nephrology. 6. Left upper extremity pain: Mostly in the hand. Left upper extremity Dopplers notable for probable chronic thrombosis in the cephalic veins in the patient's dialysis fistula in the left arm, remaining veins are widely patent, dialysis fistula is widely patent. Resolved  7. Essential hypertension: Mildly uncontrolled. Continue carvedilol, Imdur and when necessary hydralazine. Volume management across HD. 8. Anemia of chronic disease and ESRD: Stable. Follow periodically across dialysis. 9. Chest pain due to chronic stable angina/CAD/prior NSTEMI: Continue aspirin, carvedilol, Imdur and Brillinta. CT abdomen and pelvis 9/12 showed severe coronary artery calcification. Patient had reported 2  episodes of intermittent chest pain which resolved with nitroglycerin. Troponin mildly elevated at 0.16. Cardiology consulted and  indicated that she has chronic stable angina and this presentation is typical for her and would not pursue any further workup at this time. I discussed with Dr. Martinique 9/20. 10. Renal mass: CT abdomen 9/12 showed stable right kidney upper pole 2.9 cm indeterminate mass and left kidney interpolar 1.6 cm indeterminate mass. Renal protocol CT with and without contrast was recommended to further characterize, defer to nephrology. 11. Hypoglycemia: Blood glucose 48 mg per DL noted on a.m. labs 9/17. Was treated briefly with IV D5 infusion. Hypoglycemia resolved. Tolerating diet. Had discontinued D5 infusion but again hypoglycemic overnight 9/19 and reinitiated IV D5 infusion at lower rate.? Related to poor oral intake and renal insufficiency. 12. ? History of A. fib: Noted in outpatient cardiologist progress note date/23/18. Remains on amiodarone, carvedilol, ASA and Brillinta. 13. Carotid artery disease: 14. Hyperlipidemia   DVT prophylaxis: Subcutaneous heparin  Code Status: Full  Family Communication: Discussed in detail with patient's spouse at bedside.  Disposition: DC home when medically improved. Not medically ready for discharge.   Consultants:  Nephrology GI at [facility]  General surgery at [facility] Cardiology-signed off  Procedures:  Hemodialysis   Antimicrobials:  IV Ertapenem 9/14 >   Subjective: Minimal intermittent left lower quadrant abdominal pain. Tolerating diet. No further chest pain since yesterday. As per patient and spouse, confusion/hallucinations improved.  ROS: No chest pain, dyspnea or palpitations.   Objective:  Vitals:   03/29/17 1830 03/29/17 1900 03/29/17 2102 03/30/17 0900  BP: (!) 142/64 (!) 155/85 (!) 146/72 (!) 111/39  Pulse: 62 60 (!) 58 (!) 59  Resp: 18 18 20 19   Temp:  98.2 F  (36.8 C) 98.8 F (37.1 C) 98.4 F (36.9 C)  TempSrc:  Oral Oral Oral  SpO2:  100% 100% 100%  Weight:  56.2 kg (123 lb 14.4 oz) 56.3 kg (124 lb 1.9 oz)   Height:        Examination:  General exam: Pleasant elderly female, moderately built and nourished, lying comfortably supine in bed. Does not appear in any distress.Stable. Respiratory system: Clear to auscultation. Respiratory effort normal. Stable without change. Cardiovascular system: S1 & S2 heard, RRR. No JVD, murmurs, rubs, gallops or clicks. No pedal edema.Telemetry: Personally reviewed and shows sinus rhythm with BBB morphology. Stable Gastrointestinal system: Abdomen is nondistended, soft. Minimal LLQ tenderness without peritoneal signs. No organomegaly or masses felt. Normal bowel sounds heard. Stable Central nervous system: Alert and oriented. No focal neurological deficits. Stable Extremities: Symmetric 5 x 5 power. Skin: No rashes, lesions or ulcers Psychiatry: Judgement and insight appear normal. Mood & affect: Slightly anxious.     Data Reviewed: I have personally reviewed following labs and imaging studies  CBC:  Recent Labs Lab 03/24/17 0518 03/25/17 0613 03/27/17 1315 03/29/17 1520  WBC 6.1 6.4 4.7 9.0  HGB 11.9* 11.3* 10.8* 9.1*  HCT 36.8 34.3* 33.5* 28.1*  MCV 103.1* 101.2* 99.1 99.3  PLT 212 237 213 741   Basic Metabolic Panel:  Recent Labs Lab 03/24/17 0518 03/25/17 0613 03/26/17 0801 03/27/17 0241 03/27/17 1315 03/29/17 1519  NA 133* 131* 132* 129* 128* 130*  K 4.6 4.9 4.5 5.4* 4.5 4.2  CL 95* 94* 92* 95* 92* 93*  CO2 26 27 27 25 27  20*  GLUCOSE 73 103* 66 48* 122* 115*  BUN 36* 22* 23* 30* 35* 37*  CREATININE 8.15* 6.08* 5.89* 7.82* 8.66* 7.61*  CALCIUM 7.9* 8.1* 8.5* 7.9* 7.5* 7.2*  PHOS 5.2*  --  5.4* 5.5* 5.5* 4.3   Liver Function Tests:  Recent Labs Lab 03/24/17 0518 03/26/17 0801 03/27/17 0241 03/27/17 1315 03/29/17 1519  ALBUMIN 3.1* 3.0* 2.9* 2.7* 2.6*   Coagulation  Profile:  Recent Labs Lab 03/28/17 0758  INR 0.98   Cardiac Enzymes:  Recent Labs Lab 03/29/17 1520  TROPONINI 0.16*   HbA1C: No results for input(s): HGBA1C in the last 72 hours. CBG:  Recent Labs Lab 03/29/17 2131 03/30/17 0211 03/30/17 0245 03/30/17 0752 03/30/17 1201  GLUCAP 173* 53* 208* 72 108*    Recent Results (from the past 240 hour(s))  MRSA PCR Screening     Status: None   Collection Time: 03/22/17  8:51 AM  Result Value Ref Range Status   MRSA by PCR NEGATIVE NEGATIVE Final    Comment:        The GeneXpert MRSA Assay (FDA approved for NASAL specimens only), is one component of a comprehensive MRSA colonization surveillance program. It is not intended to diagnose MRSA infection nor to guide or monitor treatment for MRSA infections.       EKG 9/18 personally reviewed: SR at 72 bpm, Incomplete LBBB, LVH with repolarization, Q waves & elevated J point in V1-V2, QTC 505 ? Accuracy d/t BBB. zdoes not look much different from EKG on 02/08/2017.   Radiology Studies: Dg Chest 2 View  Result Date: 03/29/2017 CLINICAL DATA:  Shortness of breath with cough. EXAM: CHEST  2 VIEW COMPARISON:  03/22/2017. FINDINGS: The heart is enlarged. There is coronary artery calcification and thoracic atherosclerosis. There is no consolidation or edema. Dual lumen catheter unchanged, tips proximal RIGHT atrium. IMPRESSION: Stable chest. Electronically Signed   By: Staci Righter M.D.   On: 03/29/2017 09:33        Scheduled Meds: . amiodarone  200 mg Oral Daily  . aspirin EC  81 mg Oral Daily  . bisacodyl  10 mg Rectal q12n4p  . carvedilol  6.25 mg Oral BID WC  . cinacalcet  30 mg Oral QPC supper  . [START ON 03/31/2017] darbepoetin (ARANESP) injection - DIALYSIS  60 mcg Intravenous Q Fri-HD  . doxercalciferol  2.5 mcg Intravenous Q M,W,F-HD  . famotidine  20 mg Oral QODAY  . heparin  5,000 Units Subcutaneous Q8H  . isosorbide mononitrate  120 mg Oral Daily  .  lidocaine-prilocaine   Topical Q M,W,F-HD  . multivitamin  1 tablet Oral QHS  . sevelamer carbonate  800 mg Oral TID WC  . ticagrelor  90 mg Oral BID   Continuous Infusions: . sodium chloride    . sodium chloride    . sodium chloride    . sodium chloride    . dextrose 20 mL/hr at 03/30/17 0758  . ertapenem Stopped (03/29/17 2139)     LOS: 8 days     HONGALGI,ANAND, MD, FACP, FHM. [facility] Pager [phone]  If 7PM-7AM, please contact night-coverage [email or web address] Password [email or web address] 03/30/2017, 1:34 PM

## 2017-03-30 NOTE — Care Management Important Message (Signed)
Important Message  Patient Details  Name: Shawna [name] MRN: [id number] Date of Birth: [birth year]   Medicare Important Message Given:  Yes    [name] May 03/30/2017, 9:39 AM

## 2017-03-30 NOTE — Progress Notes (Signed)
Subjective/Chief Complaint: Pt doing well.  Less abd pain Loose BM overnight   Objective: Vital signs in last 24 hours: Temp:  [98 F (36.7 C)-98.8 F (37.1 C)] 98.8 F (37.1 C) (09/19 2102) Pulse Rate:  [58-71] 58 (09/19 2102) Resp:  [16-20] 20 (09/19 2102) BP: (142-189)/(37-115) 146/72 (09/19 2102) SpO2:  [100 %] 100 % (09/19 2102) Weight:  [56.2 kg (123 lb 14.4 oz)-58.6 kg (129 lb 3 oz)] 56.3 kg (124 lb 1.9 oz) (09/19 2102) Last BM Date: 03/27/17  Intake/Output from previous day: 09/19 0701 - 09/20 0700 In: 8309 [P.O.:1320; IV Piggyback:100] Out: 2000  Intake/Output this shift: No intake/output data recorded.  Constitutional: No acute distress, conversant, appears states age. Eyes: Anicteric sclerae, moist conjunctiva, no lid lag Lungs: Clear to auscultation bilaterally, normal respiratory effort CV: regular rate and rhythm, no murmurs, no peripheral edema, pedal pulses 2+ GI: Soft, no masses or hepatosplenomegaly, non-tender to palpation, no rebound/guarding Skin: No rashes, palpation reveals normal turgor Psychiatric: appropriate judgment and insight, oriented to person, place, and time   Lab Results:   Recent Labs  03/27/17 1315 03/29/17 1520  WBC 4.7 9.0  HGB 10.8* 9.1*  HCT 33.5* 28.1*  PLT 213 182   BMET  Recent Labs  03/27/17 1315 03/29/17 1519  NA 128* 130*  K 4.5 4.2  CL 92* 93*  CO2 27 20*  GLUCOSE 122* 115*  BUN 35* 37*  CREATININE 8.66* 7.61*  CALCIUM 7.5* 7.2*   PT/INR  Recent Labs  03/28/17 0758  LABPROT 12.9  INR 0.98   ABG No results for input(s): PHART, HCO3 in the last 72 hours.  Invalid input(s): PCO2, PO2  Studies/Results: Dg Chest 2 View  Result Date: 03/29/2017 CLINICAL DATA:  Shortness of breath with cough. EXAM: CHEST  2 VIEW COMPARISON:  03/22/2017. FINDINGS: The heart is enlarged. There is coronary artery calcification and thoracic atherosclerosis. There is no consolidation or edema. Dual lumen catheter  unchanged, tips proximal RIGHT atrium. IMPRESSION: Stable chest. Electronically Signed   By: Staci Righter M.D.   On: 03/29/2017 09:33   Ir US Guide Vasc Access Right  Result Date: 03/28/2017 INDICATION: 76 year old with abdominal pain and being evaluated for mesenteric ischemia. Patient needs an abdominal CTA but does not have adequate venous access for the CTA examination. Patient is end-stage renal disease and not a candidate for PICC line. Patient has a left upper arm fistula which is maturing and a right jugular dialysis catheter. EXAM: PLACEMENT OF PERIPHERAL IV WITH ULTRASOUND GUIDANCE MEDICATIONS: None ANESTHESIA/SEDATION: None FLUOROSCOPY TIME:  None). COMPLICATIONS: None immediate. PROCEDURE: Right upper arm was evaluated with ultrasound. A patent basilic vein was identified. Right upper arm was prepped and draped in a sterile fashion. Tourniquet was placed. Skin was anesthetized with 1% lidocaine. 21 gauge needle directed into the right basilic vein with ultrasound guidance. Micropuncture dilator set was placed. Catheter aspirated and flushed well. IMPRESSION: Successful placement of a peripheral IV in the right upper arm. This is intended to be a temporary access that will be removed after the CTA procedure. Electronically Signed   By: Markus Daft M.D.   On: 03/28/2017 13:35   Ir Radiology Peripheral Guided Iv Start  Result Date: 03/28/2017 INDICATION: 76 year old with abdominal pain and being evaluated for mesenteric ischemia. Patient needs an abdominal CTA but does not have adequate venous access for the CTA examination. Patient is end-stage renal disease and not a candidate for PICC line. Patient has a left upper arm fistula which  is maturing and a right jugular dialysis catheter. EXAM: PLACEMENT OF PERIPHERAL IV WITH ULTRASOUND GUIDANCE MEDICATIONS: None ANESTHESIA/SEDATION: None FLUOROSCOPY TIME:  None). COMPLICATIONS: None immediate. PROCEDURE: Right upper arm was evaluated with ultrasound.  A patent basilic vein was identified. Right upper arm was prepped and draped in a sterile fashion. Tourniquet was placed. Skin was anesthetized with 1% lidocaine. 21 gauge needle directed into the right basilic vein with ultrasound guidance. Micropuncture dilator set was placed. Catheter aspirated and flushed well. IMPRESSION: Successful placement of a peripheral IV in the right upper arm. This is intended to be a temporary access that will be removed after the CTA procedure. Electronically Signed   By: Markus Daft M.D.   On: 03/28/2017 13:35   Ct Angio Abd/pel W/ And/or W/o  Result Date: 03/28/2017 CLINICAL DATA:  Mesenteric ischemia EXAM: CTA ABDOMEN AND PELVIS wITHOUT AND WITH CONTRAST TECHNIQUE: Multidetector CT imaging of the abdomen and pelvis was performed using the standard protocol during bolus administration of intravenous contrast. Multiplanar reconstructed images and MIPs were obtained and reviewed to evaluate the vascular anatomy. CONTRAST:  80 cc Isovue 370 COMPARISON:  03/22/2017 FINDINGS: VASCULAR Aorta: There are extensive atherosclerotic calcifications throughout the aorta. There is little if any soft plaque visualized in the lower thorax or abdomen. It is nonaneurysmal. Celiac: There is 70-80% narrowing at the origin likely due with to a combination of atherosclerosis an median arcuate ligament syndrome. Branch vessels are patent. SMA: There is 50% narrowing at the origin due to atherosclerotic plaque and calcification. Beyond the origin, it is patent. Branch vessels are grossly patent. Renals: Renal arteries are severely diminutive an atrophic. IMA: There is moderate narrowing at the origin. Branch vessels are patent. Inflow: Mild atherosclerotic calcifications in the right common and external iliac arteries without significant narrowing. Mild disease in the right internal iliac artery. Similarly, there is mild atherosclerotic calcification and some soft plaque in the left common iliac artery.  The external iliac artery is pain. Internal iliac artery is patent. Proximal Outflow: The bilateral femoral arteries are grossly patent within the confines of the examination. There is some soft plaque in the right common femoral artery. There is also a small penetrating atherosclerotic ulcer. See table position -1017.3. Veins: Hepatic, portal, splenic, superior mesenteric, and bilateral renal veins are patent. IVC is patent. Bilateral common and external iliac veins are patent. Review of the MIP images confirms the above findings. NON-VASCULAR Lower chest: Dependent atelectasis bilaterally. Hepatobiliary: Subcentimeter hypodensity in the right lobe of the liver on image 20 is nonspecific. There is also a calcified granuloma in the central liver. Gallbladder is decompressed causing artificial wall thickening. Pancreas: Unremarkable Spleen: Unremarkable Adrenals/Urinary Tract: Severe atrophy of the kidneys is present. The appearance is unchanged compare to the prior study. There is a cyst in the medial mid left kidney. There is an enhancing heterogeneous mass in the upper pole of the right kidney which is stable. Bladder is decompressed. Adrenal glands are within normal limits. Stomach/Bowel: There is a 2.2 x 2.2 cm gas and fluid filled abscess in the left upper quadrant between small bowel loops. No evidence of small-bowel obstruction. Inflammatory changes in the adjacent small bowel are improved. No focal mass of the colon. Diverticulosis throughout the length of the colon is noted. There are postoperative changes in the location of the cecum. There are also postoperative changes at the sigmoid rectum junction. Lymphatic: No abnormal retroperitoneal adenopathy. Reproductive: Uterus is absent.  Adnexa are unremarkable. Other: There is no free  fluid in the pelvis. Musculoskeletal: No vertebral compression deformity. Renal osteodystrophy changes in the spine are present. IMPRESSION: VASCULAR Significant narrowing at  the origin of the celiac axis. 50% narrowing at the origin of the SMA. Moderate narrowing at the origin of the IMA. NON-VASCULAR There is a 2.2 cm gas and fluid filled abscess within the left upper quadrant between small bowel loops. Stable right upper lobe renal mass. Please see prior dictation for recommendations. Tiny hypodensity in the liver is nonspecific but likely benign. If patient has a history of malignancy, consider six-month follow-up MRI. Electronically Signed   By: Marybelle Killings M.D.   On: 03/28/2017 12:17    Anti-infectives: Anti-infectives    Start     Dose/Rate Route Frequency Ordered Stop   03/27/17 2200  ertapenem (INVANZ) 0.5 g in sodium chloride 0.9 % 50 mL IVPB     500 mg 100 mL/hr over 30 Minutes Intravenous Every 24 hours 03/27/17 0935 03/30/17 2359   03/26/17 1200  ertapenem (INVANZ) 1 g in sodium chloride 0.9 % 50 mL IVPB  Status:  Discontinued     1 g 100 mL/hr over 30 Minutes Intravenous Every T-Th-Sa (Hemodialysis) 03/26/17 1151 03/27/17 1152   03/24/17 1200  ertapenem (INVANZ) 1 g in sodium chloride 0.9 % 50 mL IVPB  Status:  Discontinued     1 g 100 mL/hr over 30 Minutes Intravenous Every M-W-F (Hemodialysis) 03/23/17 0830 03/26/17 1151   03/22/17 0745  ertapenem (INVANZ) 1 g in sodium chloride 0.9 % 50 mL IVPB  Status:  Discontinued     1 g 100 mL/hr over 30 Minutes Intravenous  Once 03/22/17 0735 03/27/17 0935   03/22/17 0715  clindamycin (CLEOCIN) IVPB 600 mg  Status:  Discontinued     600 mg 100 mL/hr over 30 Minutes Intravenous  Once 03/22/17 0700 03/22/17 0748   03/22/17 0700  metroNIDAZOLE (FLAGYL) IVPB 500 mg  Status:  Discontinued     500 mg 100 mL/hr over 60 Minutes Intravenous  Once 03/22/17 0657 03/22/17 0658   03/22/17 0700  clindamycin (CLEOCIN) IVPB 600 mg  Status:  Discontinued     600 mg 100 mL/hr over 30 Minutes Intravenous  Once 03/22/17 0658 03/22/17 0700      Assessment/Plan: ESRD on HD CAD/NSTEMI/PCI on Brillinta chronic diastolic  CHF HTN HLD Anemia of chronic disease  Abdominal pain suspected due to mesenteric ischemia &small bowel enteritis/Abscess - CT 9/12 showed short segment of inflamed small bowel surrounding a small focus of air extending beyond the bowel wall which may represent a contained perforation or small bowel diverticulitis. Trace ascites is likely reactive - U/s abdominal pelvic art/vent flow 9/16 showed elevated velocities at the origin of the celiac artery, superior mesenteric artery, inferior mesenteric artery, compatible with greater than 70% stenosis at all 3 mesenteric vessels origin - CT angio 9/18 showed 50% narrowing at the origin of the SMA, Moderate narrowing at the origin of the IMA, and a 2.2 cm gas and fluid filled abscess within the left upper quadrant between small bowel loops  ID - invanz 9/12>>day#9/10. Plan 10 days abx VTE - SCDs, heparin FEN - IVF, clear liquid diet and tol Foley - none Follow up - TBD  Plan - Pt with less pain and bowel activity -Will adv to FLD and if con't to tol with no abd pain can likely adv as tol -Con't abx -Mobilize as tol   LOS: 8 days    Rosario Jacks., Anne Hahn 03/30/2017

## 2017-03-30 NOTE — Progress Notes (Signed)
[facility] Progress Note  Dialysis Orders:[facility] EDW 56.5kg  3 hours 45 min profile 2 HD [place] 2K/2.5 calc, Heparin yes- 2000 bolus+ 200 per hour right .IJ TDCand left upper AVGG 02/24/17 - Dr. Ronalee Belts placed PC 350 BFR, 600 DFR- BP will drop fast according to nursing. Epo 7800 per HD , hectorol 2.59mcg q HD, venofer 50 , Epo 7800 , fosrenol 1000 TID for phos binding   Assessment/Plan: 1. Abdominal paindue to possible mesenteric ischemia vs SB diverticulitis, with small bowel enteritis/abscess; CT angio yesterdayshowed 2.2 cm gas and fluid filled abscess in LUQ which correlates with tenderness on exam; not ammenable to percutaneous drain; on IV ertapenem D# 5-6 2. [facility]- Used access with 16 gauge needles Wed - no problems/no steal sx - advance to 15s - uses EMLA pre HD: K K on the high side - start  3. Hypertension/volume- keep systolic BP >259 due to risk of mesenteric ischemia net UF 894 9/17 with post wt 55.8; net UF 2 L 9/19 with post wt 56.2; continue to titrate; would expect to lose weight due to prolonged NPO status 4. Anemia- hgb 11.3 > 10.8> 9.1- on Epo 7800 TIW prior to admission continue weekly Fe; will give Aranesp 60 pre HD Friday 5. Metabolic bone disease- Continue hectorol/sensipar - on sevelamer P 4.3  6. Nutrition-alb 2.7 - advance as able- on FL now 7. CAD/hx NSTEMI - EKG neg acute- CXR neg- resolved with NTG - troponin ordered 8. Renal mass - 1.6 cm left kidney nodule new in 5/18- stable 02/2017, right kidney nodule 2.2 cm 2014 to 2.9 11/2016- stable 02/2017- she tells me she had a urology appointment for evaluation but missed it due to a hospitalization and the appointment was never rescheduled - this will need to be done after discharge.  Myriam Jacobson, PA-C [facility] Beeper [phone] 03/30/2017,10:07 AM  LOS: 8 days   Pt seen, examined and agree w A/P as above.  Plan per surg is IV abx 10 days for suspected  SB diverticulitis w/ contained perforation. At dry wt, cont HD MWF.  Kelly Splinter MD [place] pager [phone]   03/30/2017, 3:46 PM    Subjective:  " Dialysis went well yesterday - feeling back to my old self.  Jesus took the pain away."  Objective Vitals:   03/29/17 1830 03/29/17 1900 03/29/17 2102 03/30/17 0900  BP: (!) 142/64 (!) 155/85 (!) 146/72 (!) 111/39  Pulse: 62 60 (!) 58 (!) 59  Resp: 18 18 20 19   Temp:  98.2 F (36.8 C) 98.8 F (37.1 C) 98.4 F (36.9 C)  TempSrc:  Oral Oral Oral  SpO2:  100% 100% 100%  Weight:  56.2 kg (123 lb 14.4 oz) 56.3 kg (124 lb 1.9 oz)   Height:       Physical Exam General: Heart: Lungs: Abdomen: Extremities: Dialysis Access:    Additional Objective Labs: Basic Metabolic Panel:  Recent Labs Lab 03/27/17 0241 03/27/17 1315 03/29/17 1519  NA 129* 128* 130*  K 5.4* 4.5 4.2  CL 95* 92* 93*  CO2 25 27 20*  GLUCOSE 48* 122* 115*  BUN 30* 35* 37*  CREATININE 7.82* 8.66* 7.61*  CALCIUM 7.9* 7.5* 7.2*  PHOS 5.5* 5.5* 4.3   Liver Function Tests:  Recent Labs Lab 03/27/17 0241 03/27/17 1315 03/29/17 1519  ALBUMIN 2.9* 2.7* 2.6*   No results for input(s): LIPASE, AMYLASE in the last 168 hours. CBC:  Recent Labs Lab 03/24/17 0518  03/25/17 0613 03/27/17 1315 03/29/17 1520  WBC 6.1 6.4 4.7 9.0  HGB 11.9* 11.3* 10.8* 9.1*  HCT 36.8 34.3* 33.5* 28.1*  MCV 103.1* 101.2* 99.1 99.3  PLT 212 237 213 182   Blood Culture    Component Value Date/Time   SDES BLOOD RIGHT ARM 10/10/2016 0124   SPECREQUEST IN PEDIATRIC BOTTLE Blood Culture adequate volume 10/10/2016 0124   CULT NO GROWTH 5 DAYS 10/10/2016 0124   REPTSTATUS 10/15/2016 FINAL 10/10/2016 0124    Cardiac Enzymes:  Recent Labs Lab 03/29/17 1520  TROPONINI 0.16*   CBG:  Recent Labs Lab 03/29/17 2046 03/29/17 2131 03/30/17 0211 03/30/17 0245 03/30/17 0752  GLUCAP 63* 173* 53* 208* 72   Iron Studies: No results for input(s):  IRON, TIBC, TRANSFERRIN, FERRITIN in the last 72 hours. Lab Results  Component Value Date   INR 0.98 03/28/2017   INR 1.01 02/08/2017   INR 1.05 01/24/2017   Studies/Results: Dg Chest 2 View  Result Date: 03/29/2017 CLINICAL DATA:  Shortness of breath with cough. EXAM: CHEST  2 VIEW COMPARISON:  03/22/2017. FINDINGS: The heart is enlarged. There is coronary artery calcification and thoracic atherosclerosis. There is no consolidation or edema. Dual lumen catheter unchanged, tips proximal RIGHT atrium. IMPRESSION: Stable chest. Electronically Signed   By: Staci Righter M.D.   On: 03/29/2017 09:33   Ct Angio Abd/pel W/ And/or W/o  Result Date: 03/28/2017 CLINICAL DATA:  Mesenteric ischemia EXAM: CTA ABDOMEN AND PELVIS wITHOUT AND WITH CONTRAST TECHNIQUE: Multidetector CT imaging of the abdomen and pelvis was performed using the standard protocol during bolus administration of intravenous contrast. Multiplanar reconstructed images and MIPs were obtained and reviewed to evaluate the vascular anatomy. CONTRAST:  80 cc Isovue 370 COMPARISON:  03/22/2017 FINDINGS: VASCULAR Aorta: There are extensive atherosclerotic calcifications throughout the aorta. There is little if any soft plaque visualized in the lower thorax or abdomen. It is nonaneurysmal. Celiac: There is 70-80% narrowing at the origin likely due with to a combination of atherosclerosis an median arcuate ligament syndrome. Branch vessels are patent. SMA: There is 50% narrowing at the origin due to atherosclerotic plaque and calcification. Beyond the origin, it is patent. Branch vessels are grossly patent. Renals: Renal arteries are severely diminutive an atrophic. IMA: There is moderate narrowing at the origin. Branch vessels are patent. Inflow: Mild atherosclerotic calcifications in the right common and external iliac arteries without significant narrowing. Mild disease in the right internal iliac artery. Similarly, there is mild atherosclerotic  calcification and some soft plaque in the left common iliac artery. The external iliac artery is pain. Internal iliac artery is patent. Proximal Outflow: The bilateral femoral arteries are grossly patent within the confines of the examination. There is some soft plaque in the right common femoral artery. There is also a small penetrating atherosclerotic ulcer. See table position -1017.3. Veins: Hepatic, portal, splenic, superior mesenteric, and bilateral renal veins are patent. IVC is patent. Bilateral common and external iliac veins are patent. Review of the MIP images confirms the above findings. NON-VASCULAR Lower chest: Dependent atelectasis bilaterally. Hepatobiliary: Subcentimeter hypodensity in the right lobe of the liver on image 20 is nonspecific. There is also a calcified granuloma in the central liver. Gallbladder is decompressed causing artificial wall thickening. Pancreas: Unremarkable Spleen: Unremarkable Adrenals/Urinary Tract: Severe atrophy of the kidneys is present. The appearance is unchanged compare to the prior study. There is a cyst in the medial mid left kidney. There is an enhancing heterogeneous mass in the upper pole of  the right kidney which is stable. Bladder is decompressed. Adrenal glands are within normal limits. Stomach/Bowel: There is a 2.2 x 2.2 cm gas and fluid filled abscess in the left upper quadrant between small bowel loops. No evidence of small-bowel obstruction. Inflammatory changes in the adjacent small bowel are improved. No focal mass of the colon. Diverticulosis throughout the length of the colon is noted. There are postoperative changes in the location of the cecum. There are also postoperative changes at the sigmoid rectum junction. Lymphatic: No abnormal retroperitoneal adenopathy. Reproductive: Uterus is absent.  Adnexa are unremarkable. Other: There is no free fluid in the pelvis. Musculoskeletal: No vertebral compression deformity. Renal osteodystrophy changes in  the spine are present. IMPRESSION: VASCULAR Significant narrowing at the origin of the celiac axis. 50% narrowing at the origin of the SMA. Moderate narrowing at the origin of the IMA. NON-VASCULAR There is a 2.2 cm gas and fluid filled abscess within the left upper quadrant between small bowel loops. Stable right upper lobe renal mass. Please see prior dictation for recommendations. Tiny hypodensity in the liver is nonspecific but likely benign. If patient has a history of malignancy, consider six-month follow-up MRI. Electronically Signed   By: Marybelle Killings M.D.   On: 03/28/2017 12:17   Medications: . sodium chloride    . sodium chloride    . sodium chloride    . sodium chloride    . dextrose 20 mL/hr at 03/30/17 0758  . ertapenem Stopped (03/29/17 2139)   . amiodarone  200 mg Oral Daily  . aspirin EC  81 mg Oral Daily  . bisacodyl  10 mg Rectal q12n4p  . carvedilol  6.25 mg Oral BID WC  . cinacalcet  30 mg Oral QPC supper  . doxercalciferol  2.5 mcg Intravenous Q M,W,F-HD  . famotidine  20 mg Oral QODAY  . heparin  5,000 Units Subcutaneous Q8H  . isosorbide mononitrate  120 mg Oral Daily  . lidocaine-prilocaine   Topical Q M,W,F-HD  . multivitamin  1 tablet Oral QHS  . sevelamer carbonate  800 mg Oral TID WC  . ticagrelor  90 mg Oral BID

## 2017-03-31 DIAGNOSIS — R0602 Shortness of breath: Secondary | ICD-10-CM

## 2017-03-31 DIAGNOSIS — R52 Pain, unspecified: Secondary | ICD-10-CM

## 2017-03-31 LAB — GLUCOSE, CAPILLARY
GLUCOSE-CAPILLARY: 129 mg/dL — AB (ref 65–99)
GLUCOSE-CAPILLARY: 181 mg/dL — AB (ref 65–99)
Glucose-Capillary: 142 mg/dL — ABNORMAL HIGH (ref 65–99)
Glucose-Capillary: 83 mg/dL (ref 65–99)

## 2017-03-31 LAB — RENAL FUNCTION PANEL
Albumin: 2.6 g/dL — ABNORMAL LOW (ref 3.5–5.0)
Anion gap: 13 (ref 5–15)
BUN: 26 mg/dL — ABNORMAL HIGH (ref 6–20)
CO2: 25 mmol/L (ref 22–32)
Calcium: 6.9 mg/dL — ABNORMAL LOW (ref 8.9–10.3)
Chloride: 89 mmol/L — ABNORMAL LOW (ref 101–111)
Creatinine, Ser: 6.81 mg/dL — ABNORMAL HIGH (ref 0.44–1.00)
GFR calc Af Amer: 6 mL/min — ABNORMAL LOW (ref >60)
GFR calc non Af Amer: 5 mL/min — ABNORMAL LOW (ref >60)
Glucose, Bld: 77 mg/dL (ref 65–99)
Phosphorus: 6 mg/dL — ABNORMAL HIGH (ref 2.5–4.6)
Potassium: 4.3 mmol/L (ref 3.5–5.1)
Sodium: 127 mmol/L — ABNORMAL LOW (ref 135–145)

## 2017-03-31 LAB — CBC
HCT: 30.9 % — ABNORMAL LOW (ref 36.0–46.0)
HEMATOCRIT: 37 % (ref 36.0–46.0)
HEMOGLOBIN: 11.9 g/dL — AB (ref 12.0–15.0)
Hemoglobin: 10.2 g/dL — ABNORMAL LOW (ref 12.0–15.0)
MCH: 32.3 pg (ref 26.0–34.0)
MCH: 32.2 pg (ref 26.0–34.0)
MCHC: 33 g/dL (ref 30.0–36.0)
MCHC: 32.2 g/dL (ref 30.0–36.0)
MCV: 97.8 fL (ref 78.0–100.0)
MCV: 100 fL (ref 78.0–100.0)
Platelets: 217 10*3/uL (ref 150–400)
Platelets: 202 10*3/uL (ref 150–400)
RBC: 3.16 MIL/uL — ABNORMAL LOW (ref 3.87–5.11)
RBC: 3.7 MIL/uL — ABNORMAL LOW (ref 3.87–5.11)
RDW: 16.4 % — ABNORMAL HIGH (ref 11.5–15.5)
RDW: 16.4 % — ABNORMAL HIGH (ref 11.5–15.5)
WBC: 5.9 10*3/uL (ref 4.0–10.5)
WBC: 5.6 10*3/uL (ref 4.0–10.5)

## 2017-03-31 MED ORDER — ERTAPENEM SODIUM 1 G IJ SOLR
0.5000 g | Freq: Every day | INTRAMUSCULAR | Status: AC
  Administered 2017-03-31 – 2017-04-02 (×3): 0.5 g via INTRAMUSCULAR
  Filled 2017-03-31 (×3): qty 1

## 2017-03-31 MED ORDER — PENTAFLUOROPROP-TETRAFLUOROETH EX AERO: 1.0000 "application " | INHALATION_SPRAY | CUTANEOUS | Status: DC | PRN

## 2017-03-31 MED ORDER — LIDOCAINE-PRILOCAINE 2.5-2.5 % EX CREA: 1.0000 "application " | TOPICAL_CREAM | CUTANEOUS | Status: DC | PRN

## 2017-03-31 MED ORDER — SODIUM CHLORIDE 0.9 % IV SOLN: 100.0000 mL | INTRAVENOUS | Status: DC | PRN

## 2017-03-31 MED ORDER — DOXERCALCIFEROL 4 MCG/2ML IV SOLN
INTRAVENOUS | Status: AC
  Administered 2017-03-31: 2.5 ug via INTRAVENOUS
  Filled 2017-03-31: qty 2

## 2017-03-31 MED ORDER — LIDOCAINE HCL (PF) 1 % IJ SOLN: 5.0000 mL | INTRAMUSCULAR | Status: DC | PRN

## 2017-03-31 MED ORDER — HEPARIN SODIUM (PORCINE) 1000 UNIT/ML DIALYSIS: 1000.0000 [IU] | INTRAMUSCULAR | Status: DC | PRN

## 2017-03-31 MED ORDER — HEPARIN SODIUM (PORCINE) 1000 UNIT/ML DIALYSIS: 20.0000 [IU]/kg | INTRAMUSCULAR | Status: DC | PRN

## 2017-03-31 MED ORDER — ALTEPLASE 2 MG IJ SOLR: 2.0000 mg | Freq: Once | INTRAMUSCULAR | Status: DC | PRN

## 2017-03-31 MED ORDER — DARBEPOETIN ALFA 60 MCG/0.3ML IJ SOSY
PREFILLED_SYRINGE | INTRAMUSCULAR | Status: AC
  Administered 2017-03-31: 60 ug via INTRAVENOUS
  Filled 2017-03-31: qty 0.3

## 2017-03-31 MED ORDER — SODIUM CHLORIDE 0.9 % IV SOLN
500.0000 mg | INTRAVENOUS | Status: DC
  Filled 2017-03-31: qty 0.5

## 2017-03-31 NOTE — Progress Notes (Signed)
Pt IV infiltrated while in HD, IV team unable to insert IV. Dr. Cathlean Sauer aware.

## 2017-03-31 NOTE — Progress Notes (Signed)
PROGRESS NOTE    Shawna [name]  FFM:[id number] DOB: 1940-07-19 DOA: 03/22/2017 PCP: [name], MD    Brief Narrative:  76 year old female who presented with abdominal pain. She does have a significant past medical history of end-stage renal disease on hemodialysis, coronary artery disease, hypertension, diastolic heart failure and gastrointestinal bleed due to arteriovenous malformations. Presents with 2 days of abdominal pain, progressive and worsening, associated with diarrhea, nausea, radiated across the abdomen and worse with deep inspiration. On initial physical examination, blood pressure 174/61, heart rate 69, respiratory rate 14, temperature 98.4, oxygen saturations 98%. Moist mucous membranes, lungs were clear to auscultation bilaterally, heart S1-S2 present and rhythmic, her abdomen was tender in the epigastric region, no guarding or rebound, no lower extremity edema. CT of the abdomen showed inflamed small bowel surrounding a focus of air extending beyond the bowel wall which may represent a contained perforation or small bowel diverticulitis.  Patient was admitted to hospital with working diagnosis of possible enteritis. Initially admitted to [facility], placed on conservative medical therapy including IV fluids, analgesics and IV antibiotics. She was diagnosed with mesenteric stenosis, transferred to [facility], 9/16, for the outpatient cardiology evaluation. She was determined not to be a good candidate for endovascular therapy. Further imaging revealed intra-abdominal abscesses.    Assessment & Plan:   Active Problems:   SBO (small bowel obstruction) (HCC)   Enteritis   Diverticulitis   Mesenteric ischemia ([place])  1. Small bowel enteritis with mesenteric ischemia and intra-abdominal abscesses. Patient tolerating po well, no nausea or vomiting, has lost IV access, unable to obtain new IV access, will change to oral formulation to IM ertapenem, and will continue to follow  on surgery recommendations.    2. End-stage renal disease on hemodialysis. Patient tolerating HD well, no signs of volume overload, will check renal panel in am.   3. Secondary hyperparathyroidism. Continue phosphate binders  4.  Left upper extremity chronic venous thrombosis, cephalic vein. Will continue conservative management. No pain.  5. HTN. Continue blood pressure control.   6. Dyslipidemia. Continue statin therapy.   7. CAD. Continue antiplatelet therapy with asa and ticagrelor. Imdur and coreg. Patient is on amiodarone as well.    DVT prophylaxis: heparn  Code Status: Full Family Communication:  Disposition Plan:    Consultants:     Procedures:     Antimicrobials:    ertapenem   Subjective: Patient feeling better, able to tolerate po well, positive bowel movement, no nausea or vomiting, no chest pain or dyspnea. Lost her IV access, unable to replace IV per IV team or nursing.   Objective: Vitals:   03/30/17 0900 03/30/17 1811 03/30/17 2052 03/31/17 0439  BP: (!) 111/39 (!) 128/50 129/60 (!) 138/43  Pulse: (!) 59 60  64  Resp: 19 18 17 15   Temp: 98.4 F (36.9 C) 97.9 F (36.6 C) 97.6 F (36.4 C) 97.8 F (36.6 C)  TempSrc: Oral Oral Oral Oral  SpO2: 100% 100% 100% 100%  Weight:   56.2 kg (123 lb 14.4 oz)   Height:        Intake/Output Summary (Last 24 hours) at 03/31/17 0755 Last data filed at 03/31/17 0600  Gross per 24 hour  Intake          1330.67 ml  Output                0 ml  Net          1330.67 ml   Danley Danker  Weights   03/29/17 1900 03/29/17 2102 03/30/17 2052  Weight: 56.2 kg (123 lb 14.4 oz) 56.3 kg (124 lb 1.9 oz) 56.2 kg (123 lb 14.4 oz)    Examination:  General: Not in pain or dyspnea,  Neurology: Awake and alert, non focal  E ENT: mild pallor, no icterus, oral mucosa moist Cardiovascular: No JVD. S1-S2 present, rhythmic, no gallops, rubs, or murmurs. No lower extremity edema. Pulmonary: vesicular breath sounds bilaterally,  adequate air movement, no wheezing, rhonchi or rales. Gastrointestinal. Abdomen not distended, no organomegaly, non tender, no rebound or guarding Skin. No rashes Musculoskeletal: no joint deformities     Data Reviewed: I have personally reviewed following labs and imaging studies  CBC:  Recent Labs Lab 03/25/17 0613 03/27/17 1315 03/29/17 1520  WBC 6.4 4.7 9.0  HGB 11.3* 10.8* 9.1*  HCT 34.3* 33.5* 28.1*  MCV 101.2* 99.1 99.3  PLT 237 213 703   Basic Metabolic Panel:  Recent Labs Lab 03/25/17 0613 03/26/17 0801 03/27/17 0241 03/27/17 1315 03/29/17 1519  NA 131* 132* 129* 128* 130*  K 4.9 4.5 5.4* 4.5 4.2  CL 94* 92* 95* 92* 93*  CO2 27 27 25 27  20*  GLUCOSE 103* 66 48* 122* 115*  BUN 22* 23* 30* 35* 37*  CREATININE 6.08* 5.89* 7.82* 8.66* 7.61*  CALCIUM 8.1* 8.5* 7.9* 7.5* 7.2*  PHOS  --  5.4* 5.5* 5.5* 4.3   GFR: Estimated Creatinine Clearance: 5 mL/min (A) (by C-G formula based on SCr of 7.61 mg/dL (H)). Liver Function Tests:  Recent Labs Lab 03/26/17 0801 03/27/17 0241 03/27/17 1315 03/29/17 1519  ALBUMIN 3.0* 2.9* 2.7* 2.6*   No results for input(s): LIPASE, AMYLASE in the last 168 hours. No results for input(s): AMMONIA in the last 168 hours. Coagulation Profile:  Recent Labs Lab 03/28/17 0758  INR 0.98   Cardiac Enzymes:  Recent Labs Lab 03/29/17 1520  TROPONINI 0.16*   BNP (last 3 results) No results for input(s): PROBNP in the last 8760 hours. HbA1C: No results for input(s): HGBA1C in the last 72 hours. CBG:  Recent Labs Lab 03/30/17 0245 03/30/17 0752 03/30/17 1201 03/30/17 1705 03/30/17 2218  GLUCAP 208* 72 108* 94 142*   Lipid Profile: No results for input(s): CHOL, HDL, LDLCALC, TRIG, CHOLHDL, LDLDIRECT in the last 72 hours. Thyroid Function Tests: No results for input(s): TSH, T4TOTAL, FREET4, T3FREE, THYROIDAB in the last 72 hours. Anemia Panel: No results for input(s): VITAMINB12, FOLATE, FERRITIN, TIBC, IRON,  RETICCTPCT in the last 72 hours.    Radiology Studies: I have reviewed all of the imaging during this hospital visit personally     Scheduled Meds: . amiodarone  200 mg Oral Daily  . aspirin EC  81 mg Oral Daily  . bisacodyl  10 mg Rectal q12n4p  . carvedilol  6.25 mg Oral BID WC  . cinacalcet  30 mg Oral QPC supper  . darbepoetin (ARANESP) injection - DIALYSIS  60 mcg Intravenous Q Fri-HD  . doxercalciferol  2.5 mcg Intravenous Q M,W,F-HD  . famotidine  20 mg Oral QODAY  . heparin  5,000 Units Subcutaneous Q8H  . isosorbide mononitrate  120 mg Oral Daily  . lidocaine-prilocaine   Topical Q M,W,F-HD  . multivitamin  1 tablet Oral QHS  . sevelamer carbonate  800 mg Oral TID WC  . ticagrelor  90 mg Oral BID   Continuous Infusions: . sodium chloride    . sodium chloride    . sodium chloride    .  sodium chloride    . sodium chloride    . sodium chloride    . dextrose 20 mL/hr at 03/30/17 0758     LOS: 9 days        Mauricio Gerome Apley, MD [facility] Pager [phone]

## 2017-03-31 NOTE — Procedures (Signed)
Still getting clear liquids.  No new recommendations, tolerating HD well, no new c/o.  Wants solid food, told her unlikely given abscess in abdomen.   I was present at this dialysis session, have reviewed the session itself and made  appropriate changes Kelly Splinter MD [place] pager [phone]   03/31/2017, 12:20 PM

## 2017-04-01 LAB — CBC WITH DIFFERENTIAL/PLATELET
Basophils Absolute: 0 10*3/uL (ref 0.0–0.1)
Basophils Relative: 0 %
Eosinophils Absolute: 0.3 10*3/uL (ref 0.0–0.7)
Eosinophils Relative: 5 %
HEMATOCRIT: 33.8 % — AB (ref 36.0–46.0)
HEMOGLOBIN: 11.2 g/dL — AB (ref 12.0–15.0)
LYMPHS ABS: 1 10*3/uL (ref 0.7–4.0)
Lymphocytes Relative: 19 %
MCH: 33.2 pg (ref 26.0–34.0)
MCHC: 33.1 g/dL (ref 30.0–36.0)
MCV: 100.3 fL — ABNORMAL HIGH (ref 78.0–100.0)
MONOS PCT: 7 %
Monocytes Absolute: 0.4 10*3/uL (ref 0.1–1.0)
NEUTROS ABS: 3.7 10*3/uL (ref 1.7–7.7)
NEUTROS PCT: 69 %
Platelets: 215 10*3/uL (ref 150–400)
RBC: 3.37 MIL/uL — AB (ref 3.87–5.11)
RDW: 16.4 % — ABNORMAL HIGH (ref 11.5–15.5)
WBC: 5.5 10*3/uL (ref 4.0–10.5)

## 2017-04-01 LAB — BASIC METABOLIC PANEL
Anion gap: 11 (ref 5–15)
BUN: 10 mg/dL (ref 6–20)
CHLORIDE: 94 mmol/L — AB (ref 101–111)
CO2: 26 mmol/L (ref 22–32)
CREATININE: 4.46 mg/dL — AB (ref 0.44–1.00)
Calcium: 7.5 mg/dL — ABNORMAL LOW (ref 8.9–10.3)
GFR calc non Af Amer: 9 mL/min — ABNORMAL LOW (ref >60)
GFR, EST AFRICAN AMERICAN: 10 mL/min — AB (ref >60)
Glucose, Bld: 119 mg/dL — ABNORMAL HIGH (ref 65–99)
POTASSIUM: 3.7 mmol/L (ref 3.5–5.1)
Sodium: 131 mmol/L — ABNORMAL LOW (ref 135–145)

## 2017-04-01 LAB — GLUCOSE, CAPILLARY
GLUCOSE-CAPILLARY: 79 mg/dL (ref 65–99)
GLUCOSE-CAPILLARY: 91 mg/dL (ref 65–99)
Glucose-Capillary: 101 mg/dL — ABNORMAL HIGH (ref 65–99)

## 2017-04-01 MED ORDER — CALCIUM CARBONATE ANTACID 500 MG PO CHEW
1.0000 | CHEWABLE_TABLET | Freq: Four times a day (QID) | ORAL | Status: DC | PRN
  Administered 2017-04-01 – 2017-04-02 (×3): 200 mg via ORAL
  Filled 2017-04-01 (×3): qty 1

## 2017-04-01 NOTE — Progress Notes (Addendum)
PROGRESS NOTE   Shawna [name]  UMP:[id number]    DOB: 08-04-1940    DOA: 03/22/2017  PCP: [name], MD   I have briefly reviewed patients previous medical records in [facility].  Brief Narrative:  76 year old married female, PMH of ESRD on MWF HD (Dr. Lowanda Foster, Nephrology), CAD/NSTEMI/PCI, chronic diastolic CHF (Dr. Domenic Polite, Cardiology), HTN, HLD, iron deficiency anemia, GI bleed due to small bowel angiodysplasia, initially admitted to [facility] on 03/22/17 for acute onset of lower abdominal pain and diarrhea. CT revealed focal enteritis-diverticulitis and question of contained perforation. GI and general surgery consulted and initially managed conservatively for presumed enteritis with bowel rest and IV antibiotics. Due to ongoing postprandial abdominal pain, suspected bowel ischemia and underwent an abdominal Doppler which showed more than 70% stenosis of all 3 mesenteric vessels at their origins. GI/Gen. surgery discussed with IR at [facility] and patient was transferred over on 9/16 for for possible stenting of mesenteric vessels. IR and nephrology consulted at [facility].CT abdomen 9/18 showed mesenteric vessel stenosis but not enough to require stenting as IR, did reveal new intra-abdominal abscess, Gen. surgery consulted. Intermittent chest pain since 9/18, Cardiology states chronic stable angina and signed off.  Assessment & Plan:   1. Abdominal pain suspected due to mesenteric ischemia & small bowel enteritis/Abscess: History as outlined above. Initially managed conservatively for possible enteritis/diverticulitis with bowel rest and IV ertapenem. Due to ongoing postprandial abdominal pain, underwent abdominal Doppler ultrasound at [facility](unable to get CTA abdomen due to no IV access for dye administration) which showed more than 70% stenosis at all 3 mesenteric vessel origins. Transferred from [facility] to [facility] on 9/16 for evaluation by interventional  radiology for possible intervention/stenting. S/P CTA abdomen 9/18 and as per IR input, no evidence of significant intestinal angina clinically or acute mesenteric ischemia. IR will follow-up in outpatient clinic in event symptoms progress to more convincing intestinal angina. CTA abdomen also showed 2.2 cm gas and fluid-filled abscess within the left upper quadrant between small bowel loops, not amenable to percutaneous drainage as per my discussion with IR on 9/18. General surgery consulted and managed conservatively with bowel rest, complete total 10 days of IV antibiotics (on ertapenem since 9/14). Gi issues improving, surgery advanced diet to regular. If pt tolerating, can likely be d/c home soon. 2. ESRD on MWF HD: Nephrology was consulting for dialysis needs at [facility] HD per nephrology  3. Hyperkalemia: Secondary to ESRD in dialysis patient. Resolved  4. Hyponatremia: Likely related to ESRD and management per nephrology. Stable  5. Secondary hyperparathyroidism: Per nephrology. 6. Left upper extremity pain: Mostly in the hand. Left upper extremity Dopplers notable for probable chronic thrombosis in the cephalic veins in the patient's dialysis fistula in the left arm, remaining veins are widely patent, dialysis fistula is widely patent. Resolved. 7. Essential hypertension: SBP in 160's.Continue carvedilol, Imdur and when necessary hydralazine. Volume 8. Anemia of chronic disease and ESRD: Overall stable.  9. Chest pain due to chronic stable angina/CAD/prior NSTEMI: Continue aspirin, carvedilol, Imdur and Brillinta. CT abdomen and pelvis 9/12 showed severe coronary artery calcification. Patient had reported 2 episodes of intermittent chest pain which resolved with nitroglycerin. Troponin mildly elevated at 0.16. Cardiology consulted and indicated that she has chronic stable angina and this presentation is typical for her and would not pursue any further workup at this time. No chest pain  this AM.  10. Renal mass: CT abdomen 9/12 showed stable right  kidney upper pole 2.9 cm indeterminate mass and left kidney interpolar 1.6 cm indeterminate mass. Renal protocol CT with and without contrast was recommended to further characterize, defer to nephrology. 11. Hypoglycemia: suspect this will improve once oral intake improves  12. ? History of A. fib: Noted in outpatient cardiologist progress note date/23/18. Remains on amiodarone, carvedilol, ASA and Brillinta. 13. Carotid artery disease: 14. Hyperlipidemia  DVT prophylaxis: Subcutaneous heparin  Code Status: Full  Family Communication: Discussed in detail with patient's spouse at bedside.  Disposition: DC home in 1-2 days, when consulting teams clear   Time spent: 25 minutes   Consultants:  Nephrology GI at [place] surgery at [place] radiology  General surgery at [facility]-signed off  Procedures:  Hemodialysis   Antimicrobials:  IV Ertapenem 9/14 >  Subjective: Pt reports feeling better.  Objective:  Vitals:   03/31/17 1747 03/31/17 2111 04/01/17 0610 04/01/17 1000  BP: (!) 165/50 (!) 125/55 (!) 146/42 (!) 168/53  Pulse: 66 67 68 70  Resp: 18 19 17 18   Temp: 98.4 F (36.9 C) 97.8 F (36.6 C) 97.6 F (36.4 C) 98 F (36.7 C)  TempSrc: Oral Oral Oral Oral  SpO2: 98% 100% 100% 100%  Weight:  56.3 kg (124 lb 1.9 oz)    Height:        Physical Exam  Constitutional: Appears well-developed and well-nourished. No distress.  CVS: RRR, S1/S2 +, no gallops, no carotid bruit.  Pulmonary: Effort and breath sounds normal, no stridor, rhonchi, wheezes, rales.  Abdominal: Soft. BS +,  no distension, tenderness, rebound or guarding.  Musculoskeletal: Normal range of motion. No edema and no tenderness.   Data Reviewed: I have personally reviewed following labs and imaging studies  CBC:  Recent Labs Lab 03/27/17 1315 03/29/17 1520 03/31/17 0753 03/31/17 1854 04/01/17 0228  WBC 4.7 9.0 5.9  5.6 5.5  NEUTROABS  --   --   --   --  3.7  HGB 10.8* 9.1* 10.2* 11.9* 11.2*  HCT 33.5* 28.1* 30.9* 37.0 33.8*  MCV 99.1 99.3 97.8 100.0 100.3*  PLT 213 182 217 202 916   Basic Metabolic Panel:  Recent Labs Lab 03/26/17 0801 03/27/17 0241 03/27/17 1315 03/29/17 1519 03/31/17 0753 04/01/17 0228  NA 132* 129* 128* 130* 127* 131*  K 4.5 5.4* 4.5 4.2 4.3 3.7  CL 92* 95* 92* 93* 89* 94*  CO2 27 25 27  20* 25 26  GLUCOSE 66 48* 122* 115* 77 119*  BUN 23* 30* 35* 37* 26* 10  CREATININE 5.89* 7.82* 8.66* 7.61* 6.81* 4.46*  CALCIUM 8.5* 7.9* 7.5* 7.2* 6.9* 7.5*  PHOS 5.4* 5.5* 5.5* 4.3 6.0*  --    Liver Function Tests:  Recent Labs Lab 03/26/17 0801 03/27/17 0241 03/27/17 1315 03/29/17 1519 03/31/17 0753  ALBUMIN 3.0* 2.9* 2.7* 2.6* 2.6*   Coagulation Profile:  Recent Labs Lab 03/28/17 0758  INR 0.98   Cardiac Enzymes:  Recent Labs Lab 03/29/17 1520  TROPONINI 0.16*   CBG:  Recent Labs Lab 03/31/17 1244 03/31/17 1744 03/31/17 2109 04/01/17 0807 04/01/17 1144  GLUCAP 83 129* 181* 79 91    Radiology Studies: No results found.   Scheduled Meds: . amiodarone  200 mg Oral Daily  . aspirin EC  81 mg Oral Daily  . bisacodyl  10 mg Rectal q12n4p  . carvedilol  6.25 mg Oral BID WC  . cinacalcet  30 mg Oral QPC supper  . darbepoetin (ARANESP) injection - DIALYSIS  60 mcg Intravenous  Q Fri-HD  . doxercalciferol  2.5 mcg Intravenous Q M,W,F-HD  . ertapenem  0.5 g Intramuscular QHS  . famotidine  20 mg Oral QODAY  . heparin  5,000 Units Subcutaneous Q8H  . isosorbide mononitrate  120 mg Oral Daily  . lidocaine-prilocaine   Topical Q M,W,F-HD  . multivitamin  1 tablet Oral QHS  . sevelamer carbonate  800 mg Oral TID WC  . ticagrelor  90 mg Oral BID   Continuous Infusions: . sodium chloride    . sodium chloride    . sodium chloride    . sodium chloride       LOS: 10 days   Faye Ramsay, MD [facility] Pager [phone]  If 7PM-7AM,  please contact night-coverage [email or web address] Password [email or web address] 04/01/2017, 3:20 PM

## 2017-04-01 NOTE — Progress Notes (Signed)
General Surgery [facility]  Assessment & Plan: Abdominal pain suspected due to mesenteric ischemia &small bowel enteritis/Abscess - CT 9/12 showed short segment of inflamed small bowel surrounding a small focus of air extending beyond the bowel wall which may represent a contained perforation or small bowel diverticulitis. Trace ascites is likely reactive - U/s abdominal pelvic art/vent flow 9/16 showed elevated velocities at the origin of the celiac artery, superior mesenteric artery, inferior mesenteric artery, compatible with greater than 70% stenosis at all 3 mesenteric vessels origin - CT angio 9/18 showed 50% narrowing at the origin of the SMA, Moderate narrowing at the origin of the IMA, and a 2.2 cm gas and fluid filled abscess within the left upper quadrant between small bowel loops  ESRD on HD CAD/NSTEMI/PCI on Brillinta chronic diastolic CHF HTN HLD Anemia of chronic disease  Plan  Completed course of abx  Tolerating full liquid diet - may advance to regular  Will sign off.  Call if needed.  No further surgical follow up needed at this time.        Earnstine Regal, MD, [facility]       Office: [phone]    Chief Complaint: Abdominal pain   Subjective: Patient in bed, eating breakfast, family at bedside.  No complaints about abdomen.  No nausea or emesis.  Having BM's.  Objective: Vital signs in last 24 hours: Temp:  [97.6 F (36.4 C)-98.4 F (36.9 C)] 97.6 F (36.4 C) (09/22 0610) Pulse Rate:  [63-72] 68 (09/22 0610) Resp:  [16-19] 17 (09/22 0610) BP: (107-165)/(35-70) 146/42 (09/22 0610) SpO2:  [98 %-100 %] 100 % (09/22 0610) Weight:  [56.2 kg (123 lb 14.4 oz)-56.3 kg (124 lb 1.9 oz)] 56.3 kg (124 lb 1.9 oz) (09/21 2111) Last BM Date: 03/30/17  Intake/Output from previous day: 09/21 0701 - 09/22 0700 In: 240 [P.O.:240] Out: 2026  Intake/Output this shift: No intake/output data  recorded.  Physical Exam: HEENT - sclerae clear, mucous membranes moist Neck - soft Chest - clear bilaterally Cor - RRR Abdomen - soft without distension; active BS present; non-tender Ext - no edema, non-tender Neuro - alert & oriented, no focal deficits  Lab Results:   Recent Labs  03/31/17 1854 04/01/17 0228  WBC 5.6 5.5  HGB 11.9* 11.2*  HCT 37.0 33.8*  PLT 202 215   BMET  Recent Labs  03/31/17 0753 04/01/17 0228  NA 127* 131*  K 4.3 3.7  CL 89* 94*  CO2 25 26  GLUCOSE 77 119*  BUN 26* 10  CREATININE 6.81* 4.46*  CALCIUM 6.9* 7.5*   PT/INR No results for input(s): LABPROT, INR in the last 72 hours. Comprehensive Metabolic Panel:    Component Value Date/Time   NA 131 (L) 04/01/2017 0228   NA 127 (L) 03/31/2017 0753   K 3.7 04/01/2017 0228   K 4.3 03/31/2017 0753   CL 94 (L) 04/01/2017 0228   CL 89 (L) 03/31/2017 0753   CO2 26 04/01/2017 0228   CO2 25 03/31/2017 0753   BUN 10 04/01/2017 0228   BUN 26 (H) 03/31/2017 0753   CREATININE 4.46 (H) 04/01/2017 0228   CREATININE 6.81 (H) 03/31/2017 0753   GLUCOSE 119 (H) 04/01/2017 0228   GLUCOSE 77 03/31/2017 0753   CALCIUM 7.5 (L) 04/01/2017 0228   CALCIUM 6.9 (L) 03/31/2017 0753   CALCIUM 8.1 (L) 05/29/2013 1814   AST 19 03/22/2017 0602   AST  17 02/08/2017 1205   ALT 14 03/22/2017 0602   ALT 10 (L) 02/08/2017 1205   ALKPHOS 104 03/22/2017 0602   ALKPHOS 99 02/08/2017 1205   BILITOT 0.5 03/22/2017 0602   BILITOT 0.4 02/08/2017 1205   PROT 7.4 03/22/2017 0602   PROT 5.3 (L) 02/08/2017 1205   ALBUMIN 2.6 (L) 03/31/2017 0753   ALBUMIN 2.6 (L) 03/29/2017 1519    Studies/Results: No results found.    Shawna [name] 04/01/2017  Patient ID: Shawna [name], female   DOB: [birth year], 76 y.o.   MRN: [id number]

## 2017-04-01 NOTE — Progress Notes (Signed)
[facility] Progress Note  Dialysis Orders:[facility] EDW 56.5kg  3 hours 45 min profile 2 HD [place] 2K/2.5 calc, Heparin yes- 2000 bolus+ 200 per hour right .IJ TDCand left upper AVGG 02/24/17 - Dr. Ronalee Belts placed PC 350 BFR, 600 DFR- BP will drop fast according to nursing. Epo 7800 per HD , hectorol 2.70mcg q HD, venofer 50 , Epo 7800 , fosrenol 1000 TID for phos binding   Assessment/Plan: 1. Abdominal paindue to possible mesenteric ischemia vs SB diverticulitis, with small bowel enteritis/abscess; CT angio yesterdayshowed 2.2 cm gas and fluid filled abscess in LUQ which correlates with tenderness on exam; not ammenable to percutaneous drain; on IV ertapenem IM- [facility] says ok to advance to regular diet 2. [facility]- Used access with 15 gauge - second use - no problems; next HD Monday if still here 3.  Hypertension/volume- keep systolic BP >250 due to risk of mesenteric ischemia net UF 894 9/17 with post wt 55.8; net UF 2 L 9/19 with post wt 56.2; Net UF 2 L 9/21 with post weight again 56.2 4. Anemia- hgb 11.2 stable on Epo 7800 TIW prior to admission continue weekly Fe; on Aranesp 60 pre HD Friday 5. Metabolic bone disease- Continue hectorol/sensipar/sevelamer P 6 - no change  6. Nutrition-alb 2.6 - advanced to renal 7. CAD/hx NSTEMI - EKG neg acute- CXR neg- resolved with NTG - troponin ordered 8. Renal mass - 1.6 cm left kidney nodule new in 5/18- stable 02/2017, right kidney nodule 2.2 cm 2014 to 2.9 11/2016- stable 02/2017- she tells me she had a urology appointment for evaluation but missed it due to a hospitalization and the appointment was never rescheduled - this will need to be done after discharge.  Myriam Jacobson, PA-C [place] 04/01/2017,9:31 AM  LOS: 10 days   Pt seen, examined and agree w A/P as above. Doing better, advancing diet.  Kelly Splinter MD [employer] pager [phone]   04/01/2017,  12:40 PM     Subjective:   Wants solid food.  Lab couldn't draw blood today.  Objective Vitals:   03/31/17 1247 03/31/17 1747 03/31/17 2111 04/01/17 0610  BP: 128/70 (!) 165/50 (!) 125/55 (!) 146/42  Pulse: 70 66 67 68  Resp: 17 18 19 17   Temp: 98.2 F (36.8 C) 98.4 F (36.9 C) 97.8 F (36.6 C) 97.6 F (36.4 C)  TempSrc: Oral Oral Oral Oral  SpO2: 100% 98% 100% 100%  Weight:   56.3 kg (124 lb 1.9 oz)   Height:       Physical Exam General: NAD sitting in bed Heart: RRR Lungs: no rales Abdomen: soft NT Extremities: no LE edema Dialysis Access: left upper AVGG + bruit and right IJ   Additional Objective Labs: Basic Metabolic Panel:  Recent Labs Lab 03/27/17 1315 03/29/17 1519 03/31/17 0753 04/01/17 0228  NA 128* 130* 127* 131*  K 4.5 4.2 4.3 3.7  CL 92* 93* 89* 94*  CO2 27 20* 25 26  GLUCOSE 122* 115* 77 119*  BUN 35* 37* 26* 10  CREATININE 8.66* 7.61* 6.81* 4.46*  CALCIUM 7.5* 7.2* 6.9* 7.5*  PHOS 5.5* 4.3 6.0*  --    Liver Function Tests:  Recent Labs Lab 03/27/17 1315 03/29/17 1519 03/31/17 0753  ALBUMIN 2.7* 2.6* 2.6*   CBC:  Recent Labs Lab 03/27/17 1315 03/29/17 1520 03/31/17 0753 03/31/17 1854 04/01/17 0228  WBC 4.7 9.0 5.9 5.6 5.5  NEUTROABS  --   --   --   --  3.7  HGB 10.8* 9.1* 10.2* 11.9* 11.2*  HCT 33.5* 28.1* 30.9* 37.0 33.8*  MCV 99.1 99.3 97.8 100.0 100.3*  PLT 213 182 217 202 215   Blood Culture    Component Value Date/Time   SDES BLOOD RIGHT ARM 10/10/2016 0124   SPECREQUEST IN PEDIATRIC BOTTLE Blood Culture adequate volume 10/10/2016 0124   CULT NO GROWTH 5 DAYS 10/10/2016 0124   REPTSTATUS 10/15/2016 FINAL 10/10/2016 0124    Cardiac Enzymes:  Recent Labs Lab 03/29/17 1520  TROPONINI 0.16*   CBG:  Recent Labs Lab 03/30/17 2218 03/31/17 1244 03/31/17 1744 03/31/17 2109 04/01/17 0807  GLUCAP 142* 83 129* 181* 79   Iron Studies: No results for input(s): IRON, TIBC, TRANSFERRIN, FERRITIN in the last 72  hours. Lab Results  Component Value Date   INR 0.98 03/28/2017   INR 1.01 02/08/2017   INR 1.05 01/24/2017   Studies/Results: No results found. Medications: . sodium chloride    . sodium chloride    . sodium chloride    . sodium chloride     . amiodarone  200 mg Oral Daily  . aspirin EC  81 mg Oral Daily  . bisacodyl  10 mg Rectal q12n4p  . carvedilol  6.25 mg Oral BID WC  . cinacalcet  30 mg Oral QPC supper  . darbepoetin (ARANESP) injection - DIALYSIS  60 mcg Intravenous Q Fri-HD  . doxercalciferol  2.5 mcg Intravenous Q M,W,F-HD  . ertapenem  0.5 g Intramuscular QHS  . famotidine  20 mg Oral QODAY  . heparin  5,000 Units Subcutaneous Q8H  . isosorbide mononitrate  120 mg Oral Daily  . lidocaine-prilocaine   Topical Q M,W,F-HD  . multivitamin  1 tablet Oral QHS  . sevelamer carbonate  800 mg Oral TID WC  . ticagrelor  90 mg Oral BID

## 2017-04-02 DIAGNOSIS — I10 Essential (primary) hypertension: Secondary | ICD-10-CM

## 2017-04-02 DIAGNOSIS — I480 Paroxysmal atrial fibrillation: Secondary | ICD-10-CM

## 2017-04-02 LAB — GLUCOSE, CAPILLARY
GLUCOSE-CAPILLARY: 85 mg/dL (ref 65–99)
Glucose-Capillary: 110 mg/dL — ABNORMAL HIGH (ref 65–99)
Glucose-Capillary: 108 mg/dL — ABNORMAL HIGH (ref 65–99)

## 2017-04-02 MED ORDER — CLONIDINE HCL 0.1 MG PO TABS
0.1000 mg | ORAL_TABLET | Freq: Two times a day (BID) | ORAL | Status: DC
  Administered 2017-04-02 (×2): 0.1 mg via ORAL
  Filled 2017-04-02 (×2): qty 1

## 2017-04-02 MED ORDER — HYDRALAZINE HCL 50 MG PO TABS
50.0000 mg | ORAL_TABLET | Freq: Once | ORAL | Status: AC
  Administered 2017-04-02: 50 mg via ORAL
  Filled 2017-04-02: qty 1

## 2017-04-02 MED ORDER — CLONIDINE HCL 0.2 MG PO TABS: 0.2000 mg | ORAL_TABLET | Freq: Two times a day (BID) | ORAL | Status: DC

## 2017-04-02 MED ORDER — HYDRALAZINE HCL 50 MG PO TABS
100.0000 mg | ORAL_TABLET | Freq: Three times a day (TID) | ORAL | Status: DC
  Administered 2017-04-02 – 2017-04-03 (×3): 100 mg via ORAL
  Filled 2017-04-02 (×4): qty 2

## 2017-04-02 NOTE — Progress Notes (Signed)
[facility] Progress Note  Dialysis Orders:[name] EDW 56.5kg 3 hours 45 min profile 2 HD [place] 2K/2.5 calc, Heparin yes- 2000 bolus+ 200 per hour right .IJ TDCand left upper AVGG 02/24/17 - Dr. Ronalee Belts placed PC 350 BFR, 600 DFR- BP will drop fast according to nursing. Epo 7800 per HD , hectorol 2.80mcg q HD, venofer 50 , Epo 7800 , fosrenol 1000 TID for phos binding   Assessment/Plan: 1. Abdominal pain- per gen surg, she has short segment SB thickening and small surrounding focus of air, c/w contained perforation or SB diverticulitis; not ammenable to percutaneous drain; on IV ertapenem IM-pain resolved - off abtx 2. [facility]D- [facility]- Used access with 15 gauge - second use - no problems; next HD Monday if still here 3.  Hypertension/volume- keep systolic BP >179 due to risk of mesenteric ischemia net UF 894 9/17 with post wt 55.8; net UF 2 L 9/19 with post wt 56.2; Net UF 2 L 9/21 with post weight again 56.2; BP high probably needs more volume down - likely has lost some weight 4. Anemia- hgb 11.2 stable on Epo 7800 TIW prior to admissioncontinue weekly Fe; on Aranesp 60 pre HD q Friday 5. Metabolic bone disease- Continue hectorol/sensipar/sevelamer P 6- no change  6. Nutrition-alb 2.6 - advanced to renal 7. CAD/hx NSTEMI- EKG neg acute- CXR neg- resolved with NTG - troponin ordered 8. Renal mass - 1.6 cm left kidney nodule new in 5/18- stable 02/2017, right kidney nodule 2.2 cm 2014 to 2.9 11/2016- stable 02/2017- she tells me she had a urology appointment for evaluation but missed it due to a hospitalization and the appointment was never rescheduled - this will need to be done after discharge.   Myriam Jacobson, PA-C [place] 04/02/2017,8:51 AM  LOS: 11 days   Pt seen, examined and agree w A/P as above.  Kelly Splinter MD [employer] pager [phone]   04/02/2017, 11:52 AM    Subjective:    Happy to be eating.  Had CP after getting off the [facility] - required NTG - also takes prn NTG at home  Objective Vitals:   04/01/17 2155 04/01/17 2309 04/02/17 0210 04/02/17 0457  BP: (!) 202/73 (!) 214/69 (!) 194/66 (!) 182/59  Pulse: 80 83  72  Resp: 16   17  Temp: 98.6 F (37 C)   98.6 F (37 C)  TempSrc:      SpO2: 98%   96%  Weight: 58.8 kg (129 lb 10.1 oz)     Height:       Physical Exam General: NAD Heart: RRR Lungs: crackles at bases Abdomen: soft NT Extremities: no LE edema Dialysis Access: right IJ and left upper AVGG + bruit   Additional Objective Labs: Basic Metabolic Panel:  Recent Labs Lab 03/27/17 1315 03/29/17 1519 03/31/17 0753 04/01/17 0228  NA 128* 130* 127* 131*  K 4.5 4.2 4.3 3.7  CL 92* 93* 89* 94*  CO2 27 20* 25 26  GLUCOSE 122* 115* 77 119*  BUN 35* 37* 26* 10  CREATININE 8.66* 7.61* 6.81* 4.46*  CALCIUM 7.5* 7.2* 6.9* 7.5*  PHOS 5.5* 4.3 6.0*  --    Liver Function Tests:  Recent Labs Lab 03/27/17 1315 03/29/17 1519 03/31/17 0753  ALBUMIN 2.7* 2.6* 2.6*   No results for input(s): LIPASE, AMYLASE in the last 168 hours. CBC:  Recent Labs Lab 03/27/17 1315 03/29/17 1520 03/31/17 0753 03/31/17 1854 04/01/17 0228  WBC 4.7  9.0 5.9 5.6 5.5  NEUTROABS  --   --   --   --  3.7  HGB 10.8* 9.1* 10.2* 11.9* 11.2*  HCT 33.5* 28.1* 30.9* 37.0 33.8*  MCV 99.1 99.3 97.8 100.0 100.3*  PLT 213 182 217 202 215   Blood Culture    Component Value Date/Time   SDES BLOOD RIGHT ARM 10/10/2016 0124   SPECREQUEST IN PEDIATRIC BOTTLE Blood Culture adequate volume 10/10/2016 0124   CULT NO GROWTH 5 DAYS 10/10/2016 0124   REPTSTATUS 10/15/2016 FINAL 10/10/2016 0124    Cardiac Enzymes:  Recent Labs Lab 03/29/17 1520  TROPONINI 0.16*   CBG:  Recent Labs Lab 03/31/17 2109 04/01/17 0807 04/01/17 1144 04/01/17 1616 04/02/17 0731  GLUCAP 181* 79 91 101* 85   Iron Studies: No results for input(s): IRON, TIBC, TRANSFERRIN, FERRITIN in  the last 72 hours. Lab Results  Component Value Date   INR 0.98 03/28/2017   INR 1.01 02/08/2017   INR 1.05 01/24/2017   Studies/Results: No results found. Medications: . sodium chloride    . sodium chloride    . sodium chloride    . sodium chloride     . amiodarone  200 mg Oral Daily  . aspirin EC  81 mg Oral Daily  . bisacodyl  10 mg Rectal q12n4p  . carvedilol  6.25 mg Oral BID WC  . cinacalcet  30 mg Oral QPC supper  . cloNIDine  0.1 mg Oral BID  . darbepoetin (ARANESP) injection - DIALYSIS  60 mcg Intravenous Q Fri-HD  . doxercalciferol  2.5 mcg Intravenous Q M,W,F-HD  . ertapenem  0.5 g Intramuscular QHS  . famotidine  20 mg Oral QODAY  . heparin  5,000 Units Subcutaneous Q8H  . isosorbide mononitrate  120 mg Oral Daily  . lidocaine-prilocaine   Topical Q M,W,F-HD  . multivitamin  1 tablet Oral QHS  . sevelamer carbonate  800 mg Oral TID WC  . ticagrelor  90 mg Oral BID

## 2017-04-02 NOTE — Progress Notes (Signed)
PROGRESS NOTE   Shawna [name]  RSW:[id number]    DOB: [birth year]    DOA: 03/22/2017  PCP: [name], MD   I have briefly reviewed patients previous medical records in [facility].  Brief Narrative:  76 year old married female, PMH of ESRD on MWF HD (Dr. Lowanda Foster, Nephrology), CAD/NSTEMI/PCI, chronic diastolic CHF (Dr. Domenic Polite, Cardiology), HTN, HLD, iron deficiency anemia, GI bleed due to small bowel angiodysplasia, initially admitted to  [facility] on 03/22/17 for acute onset of lower abdominal pain and diarrhea. CT revealed focal enteritis-diverticulitis and question of contained perforation. GI and general surgery consulted and initially managed conservatively for presumed enteritis with bowel rest and IV antibiotics. Due to ongoing postprandial abdominal pain, suspected bowel ischemia and underwent an abdominal Doppler which showed more than 70% stenosis of all 3 mesenteric vessels at their origins. GI/Gen. surgery discussed with IR at [facility] and patient was transferred over on 9/16 for for possible stenting of mesenteric vessels. IR and nephrology consulted at [facility].CT abdomen 9/18 showed mesenteric vessel stenosis but not enough to require stenting as IR, did reveal new intra-abdominal abscess, Gen. surgery consulted. Intermittent chest pain since 9/18, Cardiology states chronic stable angina and signed off.  Assessment & Plan:   1. Abdominal pain suspected due to mesenteric ischemia & small bowel enteritis/Abscess: History as outlined above. Initially managed conservatively for possible enteritis/diverticulitis with bowel rest and IV ertapenem. Due to ongoing postprandial abdominal pain, underwent abdominal Doppler ultrasound at [facility](unable to get CTA abdomen due to no IV access for dye administration) which showed more than 70% stenosis at all 3 mesenteric vessel origins. Transferred from [facility] to [facility] on 9/16 for evaluation by interventional  radiology for possible intervention/stenting. S/P CTA abdomen 9/18 and as per IR input, no evidence of significant intestinal angina clinically or acute mesenteric ischemia. IR will follow-up in outpatient clinic in event symptoms progress to more convincing intestinal angina. CTA abdomen also showed 2.2 cm gas and fluid-filled abscess within the left upper quadrant between small bowel loops, not amenable to percutaneous drainage as per my discussion with IR on 9/18. General surgery consulted and managed conservatively with bowel rest, complete total 10 days of IV antibiotics (on ertapenem since 9/14, last dose 9/24). Gi issues improving, surgery advanced diet to regular. If pt continue tolerating diet, can likely be d/c home soon. No signs of systemic infection/non toxic. 2. ESRD on MWF HD: renal service on board. Will follow rec's and continue HD treatment. 3. Hyperkalemia: Secondary to ESRD. Continue correcting electrolytes with HD treatment.  4. Hyponatremia: Likely related to ESRD and management per nephrology. Stable overall. 5. Secondary hyperparathyroidism: continue binders; will follow renal service rec's. 6. Left upper extremity pain: Mostly in the hand. Left upper extremity Dopplers notable for probable chronic thrombosis in the cephalic veins in the patient's dialysis fistula in the left arm, remaining veins are widely patent, dialysis fistula is widely patent. denies any further pain. 7. Essential hypertension: SBP in 160's.Continue carvedilol, Imdur and when necessary hydralazine. BP and Volume managed by HD. 8. Anemia of chronic disease and ESRD: Overall stable. Continue HD as scheduled (nest treatment on 9/24) 9. Chest pain due to chronic stable angina/CAD/prior NSTEMI: Continue aspirin, carvedilol, Imdur and Brillinta. CT abdomen and pelvis 9/12 showed severe coronary artery calcification. Patient had reported 2 episodes of intermittent chest pain which resolved with nitroglycerin. Troponin  mildly elevated at 0.16. Cardiology consulted and indicated that she has chronic stable angina and this  presentation is typical for her and would not pursue any further workup at this time. No chest pain currently.  10. Renal mass: CT abdomen 9/12 showed stable right kidney upper pole 2.9 cm indeterminate mass and left kidney interpolar 1.6 cm indeterminate mass. Renal protocol CT with and without contrast was recommended to further characterize, defer to nephrology. 11. Hypoglycemia: suspect this will improve once oral intake improves  12. Hx of Paroxysmal A. fib: Noted in outpatient cardiologist progress note date 03/02/17. Remains on amiodarone, carvedilol, ASA and Brillinta. Continue monitoring on telemetry. Rate well controlled and currently NSR 13. Carotid artery disease: 14. Hyperlipidemia  DVT prophylaxis: Subcutaneous heparin  Code Status: Full  Family Communication: Discussed in detail with patient's spouse at bedside.  Disposition: most likely home tomorrow after HD; needs PT evaluation. Will finish antibiotics today.   Time spent: 25 minutes   Consultants:  Nephrology GI at [place] [facility] at [place] radiology  General surgery at [facility] Cardiology-signed off  Procedures:  Hemodialysis   Antimicrobials:  IV Ertapenem 9/14 >04/02/17  Subjective: Afebrile, no CP, no palpitations or SOB. Patient reported mild abd pain while eating and also expressed feeling weak and off balance.   Objective:  Vitals:   04/01/17 2309 04/02/17 0210 04/02/17 0457 04/02/17 1000  BP: (!) 214/69 (!) 194/66 (!) 182/59 (!) 179/63  Pulse: 83  72 71  Resp:   17   Temp:   98.6 F (37 C) 98.5 F (36.9 C)  TempSrc:    Oral  SpO2:   96% 97%  Weight:      Height:        Physical Exam  Constitutional: afebrile, no CP, no SOB. Reported mild abd discomfort while eating; denies nausea, vomiting. Patient reported feeling very weak and off balance.    Rest of physical exam unchanged  from examination on 04/01/17; see below for details. CVS: RRR, S1/S2 +, no gallops, no carotid bruit.  Pulmonary: Effort and breath sounds normal, no stridor, rhonchi, wheezes, rales.  Abdominal: Soft. BS +,  no distension; No tenderness, rebound or guarding.  Musculoskeletal: Normal range of motion. No edema and no tenderness.   Data Reviewed: I have personally reviewed following labs and imaging studies  CBC:  Recent Labs Lab 03/27/17 1315 03/29/17 1520 03/31/17 0753 03/31/17 1854 04/01/17 0228  WBC 4.7 9.0 5.9 5.6 5.5  NEUTROABS  --   --   --   --  3.7  HGB 10.8* 9.1* 10.2* 11.9* 11.2*  HCT 33.5* 28.1* 30.9* 37.0 33.8*  MCV 99.1 99.3 97.8 100.0 100.3*  PLT 213 182 217 202 073   Basic Metabolic Panel:  Recent Labs Lab 03/27/17 0241 03/27/17 1315 03/29/17 1519 03/31/17 0753 04/01/17 0228  NA 129* 128* 130* 127* 131*  K 5.4* 4.5 4.2 4.3 3.7  CL 95* 92* 93* 89* 94*  CO2 25 27 20* 25 26  GLUCOSE 48* 122* 115* 77 119*  BUN 30* 35* 37* 26* 10  CREATININE 7.82* 8.66* 7.61* 6.81* 4.46*  CALCIUM 7.9* 7.5* 7.2* 6.9* 7.5*  PHOS 5.5* 5.5* 4.3 6.0*  --    Liver Function Tests:  Recent Labs Lab 03/27/17 0241 03/27/17 1315 03/29/17 1519 03/31/17 0753  ALBUMIN 2.9* 2.7* 2.6* 2.6*   Coagulation Profile:  Recent Labs Lab 03/28/17 0758  INR 0.98   Cardiac Enzymes:  Recent Labs Lab 03/29/17 1520  TROPONINI 0.16*   CBG:  Recent Labs Lab 04/01/17 0807 04/01/17 1144 04/01/17 1616 04/02/17 0731 04/02/17 1204  GLUCAP  79 91 101* 85 110*    Radiology Studies: No results found.   Scheduled Meds: . amiodarone  200 mg Oral Daily  . aspirin EC  81 mg Oral Daily  . bisacodyl  10 mg Rectal q12n4p  . carvedilol  6.25 mg Oral BID WC  . cinacalcet  30 mg Oral QPC supper  . cloNIDine  0.1 mg Oral BID  . darbepoetin (ARANESP) injection - DIALYSIS  60 mcg Intravenous Q Fri-HD  . doxercalciferol  2.5 mcg Intravenous Q M,W,F-HD  . ertapenem  0.5 g Intramuscular QHS    . famotidine  20 mg Oral QODAY  . heparin  5,000 Units Subcutaneous Q8H  . hydrALAZINE  100 mg Oral Q8H  . isosorbide mononitrate  120 mg Oral Daily  . lidocaine-prilocaine   Topical Q M,W,F-HD  . multivitamin  1 tablet Oral QHS  . sevelamer carbonate  800 mg Oral TID WC  . ticagrelor  90 mg Oral BID   Continuous Infusions: . sodium chloride    . sodium chloride    . sodium chloride    . sodium chloride       LOS: 11 days   Barton Dubois, MD [facility] Pager [phone]  If 7PM-7AM, please contact night-coverage [email or web address] Password [email or web address] 04/02/2017, 2:40 PM

## 2017-04-03 LAB — GLUCOSE, CAPILLARY
GLUCOSE-CAPILLARY: 117 mg/dL — AB (ref 65–99)
Glucose-Capillary: 89 mg/dL (ref 65–99)
Glucose-Capillary: 181 mg/dL — ABNORMAL HIGH (ref 65–99)
Glucose-Capillary: 154 mg/dL — ABNORMAL HIGH (ref 65–99)

## 2017-04-03 MED ORDER — CALCITRIOL 0.25 MCG PO CAPS
ORAL_CAPSULE | ORAL | Status: AC
  Administered 2017-04-03: 0.25 ug via ORAL
  Filled 2017-04-03: qty 1

## 2017-04-03 MED ORDER — CALCITRIOL 0.5 MCG PO CAPS
ORAL_CAPSULE | ORAL | Status: AC
  Administered 2017-04-03: 2 ug
  Filled 2017-04-03: qty 4

## 2017-04-03 MED ORDER — SODIUM CHLORIDE 0.9 % IV SOLN
Freq: Once | INTRAVENOUS | Status: AC
  Administered 2017-04-03: 18:00:00 via INTRAVENOUS

## 2017-04-03 MED ORDER — CLONIDINE HCL 0.1 MG PO TABS
0.1000 mg | ORAL_TABLET | Freq: Every day | ORAL | Status: DC
  Filled 2017-04-03: qty 1

## 2017-04-03 NOTE — Progress Notes (Signed)
PROGRESS NOTE   Shawna [name]  MWN:[id number]    DOB: 1940-09-14    DOA: 03/22/2017  PCP: [name], MD   I have briefly reviewed patients previous medical records in [facility].  Brief Narrative:  76 year old married female, PMH of ESRD on MWF HD (Dr. Lowanda Foster, Nephrology), CAD/NSTEMI/PCI, chronic diastolic CHF (Dr. Domenic Polite, Cardiology), HTN, HLD, iron deficiency anemia, GI bleed due to small bowel angiodysplasia, initially admitted to [facility] on 03/22/17 for acute onset of lower abdominal pain and diarrhea. CT revealed focal enteritis-diverticulitis and question of contained perforation. GI and general surgery consulted and initially managed conservatively for presumed enteritis with bowel rest and IV antibiotics. Due to ongoing postprandial abdominal pain, suspected bowel ischemia and underwent an abdominal Doppler which showed more than 70% stenosis of all 3 mesenteric vessels at their origins. GI/Gen. surgery discussed with IR at [facility] and patient was transferred over on 9/16 for for possible stenting of mesenteric vessels. IR and nephrology consulted at [facility].CT abdomen 9/18 showed mesenteric vessel stenosis but not enough to require stenting as IR, did reveal new intra-abdominal abscess, Gen. surgery consulted. Intermittent chest pain since 9/18, Cardiology states chronic stable angina and signed off.  Assessment & Plan:   1. Abdominal pain suspected due to mesenteric ischemia & small bowel enteritis/Abscess: History as outlined above. Initially managed conservatively for possible enteritis/diverticulitis with bowel rest and IV ertapenem. Due to ongoing postprandial abdominal pain, underwent abdominal Doppler ultrasound at [facility](unable to get CTA abdomen due to no IV access for dye administration) which showed more than 70% stenosis at all 3 mesenteric vessel origins. Transferred from [facility] to [facility] on 9/16 for evaluation by interventional  radiology for possible intervention/stenting. S/P CTA abdomen 9/18 and as per IR input, no evidence of significant intestinal angina clinically or acute mesenteric ischemia. IR will follow-up in outpatient clinic in event symptoms progress to more convincing intestinal angina. CTA abdomen also showed 2.2 cm gas and fluid-filled abscess within the left upper quadrant between small bowel loops, not amenable to percutaneous drainage as per my discussion with IR on 9/18. General surgery consulted and managed conservatively with bowel rest, completed total 10 days of IV antibiotics (on ertapenem since 9/14, last dose 9/24). Gi issues improving, surgery service advanced diet to regular and sign off. If pt continue tolerating diet, can likely be d/c home. No signs of systemic infection/non toxic. 2. ESRD on MWF HD: renal service on board. Will follow rec's and continue HD treatment. 3. Hyperkalemia: Secondary to ESRD. Continue correcting electrolytes with HD treatment.  4. Hyponatremia: Likely related to ESRD and management per nephrology. Stable overall. 5. Secondary hyperparathyroidism: continue binders; will follow renal service rec's. 6. Left upper extremity pain: Mostly in the hand. Left upper extremity Dopplers notable for probable chronic thrombosis in the cephalic veins in the patient's dialysis fistula in the left arm, remaining veins are widely patent, dialysis fistula is widely patent. denies any further pain. 7. Essential hypertension: Continue carvedilol, Imdur, clonidine and when necessary hydralazine. BP and Volume managed by HD. Today became orthostatic after HD. Will follow closely and adjust meds if needed to prevent low BP. Discussed with renal service and they will also adjust patient's dry weight. If needed stop clonidine. 8. Anemia of chronic disease and ESRD: Overall stable. Continue HD as scheduled (nest treatment on 9/24) 9. Chest pain due to chronic stable angina/CAD/prior NSTEMI: Continue  aspirin, carvedilol, Imdur and Brillinta. CT abdomen and pelvis 9/12  showed severe coronary artery calcification. Patient had reported 2 episodes of intermittent chest pain which resolved with nitroglycerin. Troponin mildly elevated at 0.16. Cardiology consulted and indicated that she has chronic stable angina and this presentation is typical for her and would not pursue any further workup at this time. No chest pain currently.  10. Renal mass: CT abdomen 9/12 showed stable right kidney upper pole 2.9 cm indeterminate mass and left kidney interpolar 1.6 cm indeterminate mass. Renal protocol CT with and without contrast was recommended to further characterize, defer to nephrology. 11. Hypoglycemia: suspect this will improve once oral intake improves  12. Hx of Paroxysmal A. fib: Noted in outpatient cardiologist progress note date 03/02/17. Remains on amiodarone, carvedilol, ASA and Brillinta. Continue monitoring on telemetry. Rate well controlled and currently NSR 13. Carotid artery disease: continue ASA and Brillinta 14. Orthostatic VS: after HD; will give 250 Ml bolus; follow orthostatic VS in am. 15. Physical deconditioning: PT has seen patient and recommended SNF.  DVT prophylaxis: Subcutaneous heparin  Code Status: Full  Family Communication: Discussed in detail with patient's spouse at bedside.  Disposition: most likely home tomorrow after HD; needs PT evaluation. Will finish antibiotics today.   Time spent: 25 minutes   Consultants:  Nephrology GI at [place] [facility] at [place] radiology  General surgery at [facility] Cardiology-signed off  Procedures:  Hemodialysis   Antimicrobials:  IV Ertapenem 9/14 >04/02/17  Subjective: No fever, no CP, no nausea or vomiting. Tolerating regular diet. Feeling weak and woozy when standing. Positive orthostatic VS after HD.  Objective:  Vitals:   04/03/17 1312 04/03/17 1347 04/03/17 1446 04/03/17 1454  BP: (!) 119/42 97/83 (!) 131/32    Pulse: 69 72  75  Resp: 16 16    Temp: 97.6 F (36.4 C) 98 F (36.7 C)    TempSrc: Oral Oral    SpO2: 96% 93%    Weight: 57 kg (125 lb 10.6 oz)     Height:        Physical Exam  Constitutional: no nausea, no vomiting, no CP, no SOB. Patient denies abd pain and is tolerating regular diet. Feeling woozy while changing positions after HD today, found to be orthostatic.  CVS: RRR, S1 and S2, no gallops, no carotid bruits, no JVD.  Pulmonary: normal resp Effort, no wheezing, no crackles.   Abdominal: soft, NT, ND, positive BS.  Musculoskeletal: Normal range motion, no edema, no cyanosis    Data Reviewed: I have personally reviewed following labs and imaging studies  CBC:  Recent Labs Lab 03/29/17 1520 03/31/17 0753 03/31/17 1854 04/01/17 0228  WBC 9.0 5.9 5.6 5.5  NEUTROABS  --   --   --  3.7  HGB 9.1* 10.2* 11.9* 11.2*  HCT 28.1* 30.9* 37.0 33.8*  MCV 99.3 97.8 100.0 100.3*  PLT 182 217 202 789   Basic Metabolic Panel:  Recent Labs Lab 03/29/17 1519 03/31/17 0753 04/01/17 0228  NA 130* 127* 131*  K 4.2 4.3 3.7  CL 93* 89* 94*  CO2 20* 25 26  GLUCOSE 115* 77 119*  BUN 37* 26* 10  CREATININE 7.61* 6.81* 4.46*  CALCIUM 7.2* 6.9* 7.5*  PHOS 4.3 6.0*  --    Liver Function Tests:  Recent Labs Lab 03/29/17 1519 03/31/17 0753  ALBUMIN 2.6* 2.6*   Coagulation Profile:  Recent Labs Lab 03/28/17 0758  INR 0.98   Cardiac Enzymes:  Recent Labs Lab 03/29/17 1520  TROPONINI 0.16*   CBG:  Recent Labs Lab  04/02/17 0731 04/02/17 1204 04/02/17 1704 04/03/17 0739 04/03/17 1346  GLUCAP 85 110* 108* 89 181*    Radiology Studies: No results found.   Scheduled Meds: . amiodarone  200 mg Oral Daily  . aspirin EC  81 mg Oral Daily  . bisacodyl  10 mg Rectal q12n4p  . carvedilol  6.25 mg Oral BID WC  . cinacalcet  30 mg Oral QPC supper  . cloNIDine  0.1 mg Oral BID  . darbepoetin (ARANESP) injection - DIALYSIS  60 mcg Intravenous Q Fri-HD  .  doxercalciferol  2.5 mcg Intravenous Q M,W,F-HD  . famotidine  20 mg Oral QODAY  . heparin  5,000 Units Subcutaneous Q8H  . hydrALAZINE  100 mg Oral Q8H  . isosorbide mononitrate  120 mg Oral Daily  . lidocaine-prilocaine   Topical Q M,W,F-HD  . multivitamin  1 tablet Oral QHS  . sevelamer carbonate  800 mg Oral TID WC  . ticagrelor  90 mg Oral BID   Continuous Infusions: . sodium chloride    . sodium chloride    . sodium chloride    . sodium chloride    . sodium chloride       LOS: 12 days   Barton Dubois, MD [facility] Pager [phone]  If 7PM-7AM, please contact night-coverage [email or web address] Password [facility] 04/03/2017, 3:47 PM

## 2017-04-03 NOTE — Progress Notes (Signed)
[facility] Progress Note  Dialysis Orders:[name] EDW 56.5kg 3 hours 45 min profile 2 HD [place] 2K/2.5 calc, Heparin yes- 2000 bolus+ 200 per hour right .IJ TDCand left upper AVGG 02/24/17 - Dr. Ronalee Belts placed PC 350 BFR, 600 DFR- BP will drop fast according to nursing. Epo 7800 per HD , hectorol 2.64mcg q HD, venofer 50 , Epo 7800 , fosrenol 1000 TID for phos binding   Assessment/Plan: 1. Abdominal pain- per gen surg, she has short segment SB thickening and small surrounding focus of air, c/w contained perforation or SB diverticulitis; not ammenable to percutaneous drain; on IV ertapenem IM-pain resolved - off abtx- surg signed off 2. [facility]- [facility]- Used access with 15 gauge - second use - no problems; cont to use access 3.  Hypertension/volume- attempt to keep systolic BP >660 due to risk of mesenteric ischemia net UF 894 9/17  post wt 55.8; net UF 2 L 9/19  post wt 56.2; Net UF 2 L 9/21  post weight again 56.2; BP high probably needs more volume down - likely has lost some weight- goal today 3000 but BP is lowish- is on coreg, hydralazine and clonidine?  Wrote order to hold clonidine if BP is low- may be able to do without 4. Anemia- hgb 11.2 stable on Epo 7800 TIW prior to admissioncontinue weekly Fe; on Aranesp 60 pre HD q Friday 5. Metabolic bone disease- Continue hectorol/sensipar/sevelamer P 6- no change  6. Nutrition-alb 2.6 - advanced to renal 7. CAD/hx NSTEMI- EKG neg acute- CXR neg- resolved with NTG - troponin ordered 8. Renal mass - 1.6 cm left kidney nodule new in 5/18- stable 02/2017, right kidney nodule 2.2 cm 2014 to 2.9 11/2016- stable 02/2017-  had a urology appointment for evaluation but missed it due to a hospitalization and the appointment was never rescheduled - this will need to be done after discharge.   Shawna [name] A  04/03/2017,11:20 AM  LOS: 12 days      Subjective:   Seen on HD- no new complaints- goal is 3000  BP lowish for her  Objective Vitals:   04/03/17 0930 04/03/17 1000 04/03/17 1030 04/03/17 1100  BP: (!) 107/45 (!) 106/45 107/62 (!) 98/48  Pulse: 64 65 66 66  Resp: 18 18 18 18   Temp:      TempSrc:      SpO2:      Weight:      Height:       Physical Exam General: NAD Heart: RRR Lungs: crackles at bases Abdomen: soft NT Extremities: no LE edema Dialysis Access: right IJ and left upper AVGG + bruit   Additional Objective Labs: Basic Metabolic Panel:  Recent Labs Lab 03/27/17 1315 03/29/17 1519 03/31/17 0753 04/01/17 0228  NA 128* 130* 127* 131*  K 4.5 4.2 4.3 3.7  CL 92* 93* 89* 94*  CO2 27 20* 25 26  GLUCOSE 122* 115* 77 119*  BUN 35* 37* 26* 10  CREATININE 8.66* 7.61* 6.81* 4.46*  CALCIUM 7.5* 7.2* 6.9* 7.5*  PHOS 5.5* 4.3 6.0*  --    Liver Function Tests:  Recent Labs Lab 03/27/17 1315 03/29/17 1519 03/31/17 0753  ALBUMIN 2.7* 2.6* 2.6*   No results for input(s): LIPASE, AMYLASE in the last 168 hours. CBC:  Recent Labs Lab 03/27/17 1315 03/29/17 1520 03/31/17 0753 03/31/17 1854 04/01/17 0228  WBC 4.7 9.0 5.9 5.6 5.5  NEUTROABS  --   --   --   --  3.7  HGB  10.8* 9.1* 10.2* 11.9* 11.2*  HCT 33.5* 28.1* 30.9* 37.0 33.8*  MCV 99.1 99.3 97.8 100.0 100.3*  PLT 213 182 217 202 215   Blood Culture    Component Value Date/Time   SDES BLOOD RIGHT ARM 10/10/2016 0124   SPECREQUEST IN PEDIATRIC BOTTLE Blood Culture adequate volume 10/10/2016 0124   CULT NO GROWTH 5 DAYS 10/10/2016 0124   REPTSTATUS 10/15/2016 FINAL 10/10/2016 0124    Cardiac Enzymes:  Recent Labs Lab 03/29/17 1520  TROPONINI 0.16*   CBG:  Recent Labs Lab 04/01/17 1616 04/02/17 0731 04/02/17 1204 04/02/17 1704 04/03/17 0739  GLUCAP 101* 85 110* 108* 89   Iron Studies: No results for input(s): IRON, TIBC, TRANSFERRIN, FERRITIN in the last 72 hours. Lab Results  Component Value Date   INR 0.98 03/28/2017   INR 1.01 02/08/2017   INR 1.05 01/24/2017    Studies/Results: No results found. Medications: . sodium chloride    . sodium chloride    . sodium chloride    . sodium chloride     . amiodarone  200 mg Oral Daily  . aspirin EC  81 mg Oral Daily  . bisacodyl  10 mg Rectal q12n4p  . carvedilol  6.25 mg Oral BID WC  . cinacalcet  30 mg Oral QPC supper  . cloNIDine  0.1 mg Oral BID  . darbepoetin (ARANESP) injection - DIALYSIS  60 mcg Intravenous Q Fri-HD  . doxercalciferol  2.5 mcg Intravenous Q M,W,F-HD  . famotidine  20 mg Oral QODAY  . heparin  5,000 Units Subcutaneous Q8H  . hydrALAZINE  100 mg Oral Q8H  . isosorbide mononitrate  120 mg Oral Daily  . lidocaine-prilocaine   Topical Q M,W,F-HD  . multivitamin  1 tablet Oral QHS  . sevelamer carbonate  800 mg Oral TID WC  . ticagrelor  90 mg Oral BID

## 2017-04-03 NOTE — Procedures (Signed)
Patient was seen on dialysis and the procedure was supervised.  BFR 400  Via AVF BP is  121/47.   Patient appears to be tolerating treatment well- I decreased goal to keep BP up  Gambell A 04/03/2017

## 2017-04-03 NOTE — Progress Notes (Signed)
MD Dyann Kief ordered 250 mL bolus but pt does not have IV access- she is a very hard stick. IV team has not been able to obtain IV access.  Awaiting orders  Paulla Fore, RN

## 2017-04-03 NOTE — Evaluation (Signed)
Physical Therapy Evaluation Patient Details Name: Shawna [name] MRN: [id number] DOB: Jan 31, 1941 Today's Date: 04/03/2017   History of Present Illness  76 year old married female, PMH of ESRD on MWF HD (Dr. Lowanda Foster, Nephrology), CAD/NSTEMI/PCI, chronic diastolic CHF (Dr. Domenic Polite, Cardiology), HTN, HLD, iron deficiency anemia, GI bleed due to small bowel angiodysplasia, initially admitted to [facility] on 03/22/17 for acute onset of lower abdominal pain and diarrhea. CT revealed focal enteritis-diverticulitis and question of contained perforation. GI and general surgery consulted and initially managed conservatively for presumed enteritis with bowel rest and IV antibiotics. Due to ongoing postprandial abdominal pain, suspected bowel ischemia and underwent an abdominal Doppler which showed more than 70% stenosis of all 3 mesenteric vessels at their origins. GI/Gen. surgery discussed with IR at [facility] and patient was transferred over on 9/16 for for possible stenting of mesenteric vessels. IR and nephrology consulted at [facility].CT abdomen 9/18 showed mesenteric vessel stenosis but not enough to require stenting as IR, did reveal new intra-abdominal abscess, Gen. surgery consulted, and managing conservatively  Clinical Impression   Pt admitted with above diagnosis. Pt currently with functional limitations due to the deficits listed below (see PT Problem List). Presents with decr activity tolerance and orthostatic hypotension; Dr. Dyann Kief aware;  Pt will benefit from skilled PT to increase their independence and safety with mobility to allow discharge to the venue listed below.       Follow Up Recommendations SNF    Equipment Recommendations  Rolling walker with 5" wheels;3in1 (PT)    Recommendations for Other Services       Precautions / Restrictions Precautions Precautions: Fall Precaution Comments: Orthostatic hypotension      Mobility  Bed Mobility Overal bed mobility:  Needs Assistance Bed Mobility: Supine to Sit;Sit to Supine     Supine to sit: Min assist Sit to supine: Min guard   General bed mobility comments: Min assist to pull to sit; able to lift legs back into bed without physical assist  Transfers Overall transfer level: Needs assistance Equipment used: Rolling walker (2 wheeled) Transfers: Sit to/from Stand Sit to Stand: Mod assist         General transfer comment: Cues for hand placement and safety; Pervasive posterior lean  Ambulation/Gait             General Gait Details: Unable today; BP drop in standing  Stairs            Wheelchair Mobility    Modified Rankin (Stroke Patients Only)       Balance Overall balance assessment: Needs assistance Sitting-balance support: Bilateral upper extremity supported Sitting balance-Leahy Scale: Poor Sitting balance - Comments: Noted growing posterior lean with incr time sitting up Postural control: Posterior lean Standing balance support: Bilateral upper extremity supported Standing balance-Leahy Scale: Poor Standing balance comment: Posterior lean requiring mod assist to maintain standing                             Pertinent Vitals/Pain Pain Assessment: No/denies pain    Home Living Family/patient expects to be discharged to:: Private residence Living Arrangements: Spouse/significant other Available Help at Discharge: Family;Available 24 hours/day Type of Home: House Home Access: Stairs to enter Entrance Stairs-Rails: Right Entrance Stairs-Number of Steps: 3 Home Layout: One level Home Equipment: Walker - 2 wheels;Cane - single point;Shower seat      Prior Function Level of Independence: Needs assistance   Gait / Transfers Assistance Needed: Spouse  assist up stairs and to step into tub  ADL's / Homemaking Assistance Needed: does some household work, spouse does most  Comments: uses straight cane, spouse assist pt on stairs     Hand Dominance    Dominant Hand: Right    Extremity/Trunk Assessment   Upper Extremity Assessment Upper Extremity Assessment: Generalized weakness    Lower Extremity Assessment Lower Extremity Assessment: Generalized weakness       Communication   Communication: No difficulties  Cognition Arousal/Alertness: Awake/alert Behavior During Therapy: WFL for tasks assessed/performed Overall Cognitive Status: Within Functional Limits for tasks assessed                                        General Comments General comments (skin integrity, edema, etc.):   04/03/17 1454  Vital Signs  Patient Position (if appropriate) Orthostatic Vitals  Orthostatic Lying   BP- Lying (!) 131/32  Pulse- Lying 75  Orthostatic Sitting  BP- Sitting 129/40  Pulse- Sitting 78  Orthostatic Standing at 0 minutes  BP- Standing at 0 minutes (!) 89/56  Pulse- Standing at 0 minutes 77       Exercises     Assessment/Plan    PT Assessment Patient needs continued PT services  PT Problem List Decreased strength;Decreased activity tolerance;Decreased balance;Decreased mobility;Decreased coordination;Decreased knowledge of use of DME;Decreased safety awareness;Decreased knowledge of precautions;Cardiopulmonary status limiting activity       PT Treatment Interventions DME instruction;Gait training;Functional mobility training;Therapeutic activities;Therapeutic exercise;Balance training;Cognitive remediation;Patient/family education    PT Goals (Current goals can be found in the Care Plan section)  Acute Rehab PT Goals Patient Stated Goal: wants to feel well enough to go home PT Goal Formulation: With patient Time For Goal Achievement: 04/17/17 Potential to Achieve Goals: Good    Frequency Min 3X/week   Barriers to discharge        Co-evaluation               AM-PAC PT "6 Clicks" Daily Activity  Outcome Measure Difficulty turning over in bed (including adjusting bedclothes, sheets and  blankets)?: A Little Difficulty moving from lying on back to sitting on the side of the bed? : A Little Difficulty sitting down on and standing up from a chair with arms (e.g., wheelchair, bedside commode, etc,.)?: Unable Help needed moving to and from a bed to chair (including a wheelchair)?: A Lot Help needed walking in hospital room?: A Lot Help needed climbing 3-5 steps with a railing? : Total 6 Click Score: 12    End of Session Equipment Utilized During Treatment: Gait belt Activity Tolerance: Other (comment) (limited by orthostatic hypotension) Patient left: in bed;with call bell/phone within reach;with family/visitor present Nurse Communication: Mobility status PT Visit Diagnosis: Unsteadiness on feet (R26.81);Other abnormalities of gait and mobility (R26.89);Muscle weakness (generalized) (M62.81)    Time: 1431-1510 PT Time Calculation (min) (ACUTE ONLY): 39 min   Charges:   PT Evaluation $PT Eval Moderate Complexity: 1 Mod PT Treatments $Therapeutic Activity: 23-37 mins   PT G Codes:        Roney Marion, PT  Acute Rehabilitation Services Pager [phone] Office [phone]   Colletta Maryland 04/03/2017, 4:24 PM

## 2017-04-03 NOTE — Care Management Important Message (Signed)
Important Message  Patient Details  Name: Shawna [name] MRN: [id number] Date of Birth: August 18, 1940   Medicare Important Message Given:  Yes    [name] May 04/03/2017, 9:14 AM

## 2017-04-04 DIAGNOSIS — K56609 Unspecified intestinal obstruction, unspecified as to partial versus complete obstruction: Secondary | ICD-10-CM

## 2017-04-04 LAB — GLUCOSE, CAPILLARY
GLUCOSE-CAPILLARY: 94 mg/dL (ref 65–99)
GLUCOSE-CAPILLARY: 104 mg/dL — AB (ref 65–99)
Glucose-Capillary: 84 mg/dL (ref 65–99)
Glucose-Capillary: 111 mg/dL — ABNORMAL HIGH (ref 65–99)

## 2017-04-04 MED ORDER — HYDRALAZINE HCL 50 MG PO TABS
50.0000 mg | ORAL_TABLET | Freq: Three times a day (TID) | ORAL | Status: DC
  Filled 2017-04-04: qty 1

## 2017-04-04 MED ORDER — HYDRALAZINE HCL 25 MG PO TABS
25.0000 mg | ORAL_TABLET | Freq: Two times a day (BID) | ORAL | Status: DC
  Administered 2017-04-05: 25 mg via ORAL
  Filled 2017-04-04: qty 1

## 2017-04-04 NOTE — Clinical Social Work Note (Signed)
Clinical Social Work Assessment  Patient Details  Name: Shawna [name] MRN: [id number] Date of Birth: 04-04-1941  Date of referral:  04/04/17               Reason for consult:  Facility Placement                Permission sought to share information with:  Other Permission granted to share information::  Yes, Verbal Permission Granted  Name::     Shawna [name]  Agency::     Relationship::  Husband  Contact Information:  [phone]  Housing/Transportation Living arrangements for the past 2 months:  Single Family Home Source of Information:  Patient, Spouse Patient Interpreter Needed:  None Criminal Activity/Legal Involvement Pertinent to Current Situation/Hospitalization:  No - Comment as needed Significant Relationships:  Adult Children, Spouse, Other Family Members Lives with:  Spouse Do you feel safe going back to the place where you live?  No (Patient agreeable to ST rehab before returning home) Need for family participation in patient care:  Yes (Comment)  Care giving concerns:  Patient and husband agreeable to [name] rehab as patient reported that she is weak and needs more help.  Social Worker assessment / plan: CSW talked with patient and husband at the bedside regarding discharge disposition and recommendation of ST rehab. Mr. And [name] live in [place], [place] and patient receives her dialysis in [facility]), and her doctors are in [place] per patient and husband. CSW informed that their daughter Shawna [name] lives in [place] and their oldest daughter died last year. The couple have 6 grandchildren.   Patient was sitting up in bed and was alert, oriented and agreeable to talking with CSW. Her husband was pleasant and comfortably participated in conversation. Facility preference is [facility]) as she has been a couple of times before.    Employment status:  Retired Forensic scientist:  Office manager) PT Recommendations:   [place] / Referral to community resources:  [place] (Patient provided CSW with facility preference)  Patient/Family's Response to care:  No concerns expressed by patient or husband regarding care during hospitalization.  Patient/Family's Understanding of and Emotional Response to Diagnosis, Current Treatment, and Prognosis:  Patient and husband appear to have a clear understanding of patient's need for rehab before patient returns home. Patient is concerned about discharge as she reported during our conversation that she could not walk, and CSW reassured her that this is the purpose of rehab.   Emotional Assessment Appearance:  Appears stated age Attitude/Demeanor/Rapport:  Other (Appropriate) Affect (typically observed):  Appropriate, Pleasant Orientation:  Oriented to Self, Oriented to Place, Oriented to  Time, Oriented to Situation Alcohol / Substance use:  Tobacco Use, Alcohol Use, Illicit Drugs (Patient reported that she has never smoked and does not drink or use illict drugs) Psych involvement (Current and /or in the community):  No (Comment)  Discharge Needs  Concerns to be addressed:  Discharge Planning Concerns Readmission within the last 30 days:  Yes Current discharge risk:  None Barriers to Discharge:  Insurance Authorization, Barriers Resolved   Shawna Feil, LCSW 04/04/2017, 8:38 PM

## 2017-04-04 NOTE — Progress Notes (Signed)
PROGRESS NOTE    Shawna [name]  DQQ:[id number] DOB: 1940/09/17 DOA: 03/22/2017 PCP: [name], MD    Brief Narrative:  76 year old female who presented with abdominal pain. She does have a significant past medical history of end-stage renal disease on hemodialysis, coronary artery disease, hypertension, diastolic heart failure and gastrointestinal bleed due to arteriovenous malformations. Presents with 2 days of abdominal pain, progressive and worsening, associated with diarrhea, nausea, radiated across the abdomen and worse with deep inspiration. On initial physical examination, blood pressure 174/61, heart rate 69, respiratory rate 14, temperature 98.4, oxygen saturations 98%. Moist mucous membranes, lungs were clear to auscultation bilaterally, heart S1-S2 present and rhythmic, her abdomen was tender in the epigastric region, no guarding or rebound, no lower extremity edema. CT of the abdomen showed inflamed small bowel surrounding a focus of air extending beyond the bowel wall which may represent a contained perforation or small bowel diverticulitis.  Patient was admitted to hospital with working diagnosis of possible enteritis. Initially admitted to [facility], placed on conservative medical therapy including IV fluids, analgesics and IV antibiotics. She was diagnosed with mesenteric stenosis, transferred to [facility], 9/16, for the outpatient cardiology evaluation. She was determined not to be a good candidate for endovascular therapy. Further imaging revealed intra-abdominal abscesses.   Assessment & Plan:   Active Problems:   SBO (small bowel obstruction) (HCC)   Enteritis   Diverticulitis   Mesenteric ischemia ([place])   1. Small bowel enteritis with mesenteric ischemia and intra-abdominal abscesses. Patient continue to tolerate po well. Patient has completed antibiotic therapy. Patient with significant deconditioining, plan for snf in am, of no further hypotensive episodes.    2. End-stage renal disease on hemodialysis. On HDl with good toleration, had HD yesterday with episodes of hypotension. Will check orthostatics today.   3. Secondary hyperparathyroidism. On phosphate binders  4.  Left upper extremity chronic venous thrombosis, cephalic vein. Conservative management.   5. HTN. Patient found hypotensive, clonidine has been on hold, will decrease hydralazine to 25 mg po bid, and will monitor for any signs of rebound hypertension.   6. Dyslipidemia. Continue statin therapy, with good toleration.   7. CAD. On antiplatelet therapy with asa and ticagrelor. Medical therapy with Imdur, coreg, and amiodarone.   DVT prophylaxis: heparn  Code Status: Full Family Communication:  Disposition Plan:    Consultants:     Procedures:     Antimicrobials:    ertapenem  Subjective: Patient reports being very weak and deconditioned, having difficulty getting out of her bed and ambulating, moderate to severe, with no improving, or worsening factors, no associated dyspnea or chest pain. Yesterday had hypotension after HD.   Objective: Vitals:   04/03/17 1712 04/03/17 1823 04/03/17 2155 04/04/17 0626  BP: (!) 109/31 (!) 99/40 (!) 127/42 (!) 129/58  Pulse: 72 75 78 79  Resp: 17  16 17   Temp: 98.7 F (37.1 C)  98.5 F (36.9 C) 98.6 F (37 C)  TempSrc: Oral  Oral Oral  SpO2: 99% 100% 100% 100%  Weight:   57.6 kg (126 lb 15.8 oz)   Height:        Intake/Output Summary (Last 24 hours) at 04/04/17 0801 Last data filed at 04/04/17 0600  Gross per 24 hour  Intake              762 ml  Output             2000 ml  Net            -  1238 ml   Filed Weights   04/03/17 0910 04/03/17 1312 04/03/17 2155  Weight: 59.2 kg (130 lb 8.2 oz) 57 kg (125 lb 10.6 oz) 57.6 kg (126 lb 15.8 oz)    Examination:  General: Not in pain or dyspnea, deconditioned Neurology: Awake and alert, non focal  E ENT: mild pallor, no icterus, oral mucosa  moist Cardiovascular: No JVD. S1-S2 present, rhythmic, no gallops, rubs, or murmurs. No lower extremity edema. Pulmonary: vesicular breath sounds bilaterally, adequate air movement, no wheezing, rhonchi or rales. Gastrointestinal. Abdomen flat, no organomegaly, non tender, no rebound or guarding Skin. No rashes Musculoskeletal: no joint deformities     Data Reviewed: I have personally reviewed following labs and imaging studies  CBC:  Recent Labs Lab 03/29/17 1520 03/31/17 0753 03/31/17 1854 04/01/17 0228  WBC 9.0 5.9 5.6 5.5  NEUTROABS  --   --   --  3.7  HGB 9.1* 10.2* 11.9* 11.2*  HCT 28.1* 30.9* 37.0 33.8*  MCV 99.3 97.8 100.0 100.3*  PLT 182 217 202 443   Basic Metabolic Panel:  Recent Labs Lab 03/29/17 1519 03/31/17 0753 04/01/17 0228  NA 130* 127* 131*  K 4.2 4.3 3.7  CL 93* 89* 94*  CO2 20* 25 26  GLUCOSE 115* 77 119*  BUN 37* 26* 10  CREATININE 7.61* 6.81* 4.46*  CALCIUM 7.2* 6.9* 7.5*  PHOS 4.3 6.0*  --    GFR: Estimated Creatinine Clearance: 8.5 mL/min (A) (by C-G formula based on SCr of 4.46 mg/dL (H)). Liver Function Tests:  Recent Labs Lab 03/29/17 1519 03/31/17 0753  ALBUMIN 2.6* 2.6*   No results for input(s): LIPASE, AMYLASE in the last 168 hours. No results for input(s): AMMONIA in the last 168 hours. Coagulation Profile: No results for input(s): INR, PROTIME in the last 168 hours. Cardiac Enzymes:  Recent Labs Lab 03/29/17 1520  TROPONINI 0.16*   BNP (last 3 results) No results for input(s): PROBNP in the last 8760 hours. HbA1C: No results for input(s): HGBA1C in the last 72 hours. CBG:  Recent Labs Lab 04/02/17 1704 04/03/17 0739 04/03/17 1346 04/03/17 1646 04/03/17 2152  GLUCAP 108* 89 181* 154* 117*   Lipid Profile: No results for input(s): CHOL, HDL, LDLCALC, TRIG, CHOLHDL, LDLDIRECT in the last 72 hours. Thyroid Function Tests: No results for input(s): TSH, T4TOTAL, FREET4, T3FREE, THYROIDAB in the last 72  hours. Anemia Panel: No results for input(s): VITAMINB12, FOLATE, FERRITIN, TIBC, IRON, RETICCTPCT in the last 72 hours.    Radiology Studies: I have reviewed all of the imaging during this hospital visit personally     Scheduled Meds: . amiodarone  200 mg Oral Daily  . aspirin EC  81 mg Oral Daily  . bisacodyl  10 mg Rectal q12n4p  . carvedilol  6.25 mg Oral BID WC  . cinacalcet  30 mg Oral QPC supper  . cloNIDine  0.1 mg Oral Daily  . darbepoetin (ARANESP) injection - DIALYSIS  60 mcg Intravenous Q Fri-HD  . doxercalciferol  2.5 mcg Intravenous Q M,W,F-HD  . famotidine  20 mg Oral QODAY  . heparin  5,000 Units Subcutaneous Q8H  . hydrALAZINE  100 mg Oral Q8H  . isosorbide mononitrate  120 mg Oral Daily  . lidocaine-prilocaine   Topical Q M,W,F-HD  . multivitamin  1 tablet Oral QHS  . sevelamer carbonate  800 mg Oral TID WC  . ticagrelor  90 mg Oral BID   Continuous Infusions: . sodium chloride    . sodium  chloride    . sodium chloride    . sodium chloride       LOS: 13 days        Tawni Millers, MD [facility] Pager [phone]

## 2017-04-04 NOTE — NC FL2 (Signed)
[place] LEVEL OF CARE SCREENING TOOL     IDENTIFICATION  Patient Name: Shawna [name] Birthdate: Feb 07, 1941 Sex: female Admission Date (Current Location): 03/22/2017  [place] and [place] Number:  Shawna [name] (Patient lives in [place], [facility])   Facility and Address:  The . [facility], [place] [address], [place], [place] [address]      Provider Number: [id number]  Attending Physician Name and Address:  Tawni Millers,*  Relative Name and Phone Number:  Raymonda Pell; [phone],    Current Level of Care: Hospital Recommended Level of Care: [place] Prior Approval Number:    Date Approved/Denied:   PASRR Number:  (Submitted for PASRR 9/25-Pending)  Discharge Plan: SNF    Current Diagnoses: Patient Active Problem List   Diagnosis Date Noted  . Mesenteric ischemia ([place])   . Diverticulitis   . SBO (small bowel obstruction) ([place]) 03/22/2017  . Enteritis   . Complication of vascular access for dialysis 03/19/2017  . Preoperative clearance 01/25/2017  . Symptomatic anemia 10/24/2016  . H/O non-ST elevation myocardial infarction (NSTEMI) 10/24/2016  . Fluid overload 10/10/2016  . Complication from renal dialysis device 10/10/2016  . Non-ST elevation MI (NSTEMI) ([place])   . Encounter for fitting and adjustment of vascular catheter   . End stage renal disease ([place])   . ESRD (end stage renal disease) ([place])   . Heme positive stool   . Demand ischemia ([place]) 07/27/2016  . Hypertensive emergency 07/08/2016  . Acute on chronic respiratory failure with hypoxia ([place])   . Cardiac arrest ([place])   . Palliative care encounter   . Goals of care, counseling/discussion   . Hypertensive crisis without congestive heart failure 05/09/2016  . Acute pulmonary edema ([place]) 04/06/2016  . Acute respiratory failure ([place]) 04/06/2016  . Hypertensive crisis 01/27/2016  . History of colon cancer 01/27/2016  . History of  ovarian cancer 01/27/2016  . Hypertensive urgency 01/27/2016  . Narrow complex tachycardia ([place]) 09/08/2015  . SVT (supraventricular tachycardia) ([place]) 09/08/2015  . Influenza A 08/30/2015  . Acute on chronic diastolic CHF (congestive heart failure) ([place]) 05/04/2015  . Unstable angina ([place]) 05/03/2015  . Essential hypertension   . Pain in joint, lower leg 08/14/2014  . Chest pain 11/26/2013  . Small bowel obstruction, partial ([place]) 05/29/2013  . Chronic diastolic CHF (congestive heart failure) ([place]) 03/22/2013  . GI bleeding 03/21/2013  . Acute blood loss anemia 03/21/2013  . Vaginal odor 03/12/2013  . Vaginal discharge 03/12/2013  . Occlusion and stenosis of carotid artery without mention of cerebral infarction 01/24/2013  . Hx of CABG 07/05/2012  . Carotid artery disease ([place]) 07/05/2012  . Anemia of chronic kidney failure 07/03/2012  . Secondary hyperparathyroidism ([place]) 07/03/2012  . Mitral regurgitation 06/12/2012  . Pneumonia 06/09/2012  . Non-STEMI (non-ST elevated myocardial infarction) ([place]) 06/08/2012  . AVM (arteriovenous malformation) of small bowel, acquired ([place]) 01/20/2012  . GERD (gastroesophageal reflux disease) 01/09/2012  . HLD (hyperlipidemia) 01/05/2012  . Atherosclerosis of native coronary artery of native heart without angina pectoris 12/16/2011  . ESRD on hemodialysis ([place]) 12/16/2011    Orientation RESPIRATION BLADDER Height & Weight     Self, Time, Situation, Place  Normal Continent Weight: 126 lb 15.8 oz (57.6 kg) Height:  5\' 2"  (157.5 cm)  BEHAVIORAL SYMPTOMS/MOOD NEUROLOGICAL BOWEL NUTRITION STATUS      Continent Diet (Renal diet with 1200 mL fluid restriction)  AMBULATORY STATUS COMMUNICATION OF NEEDS Skin   Total Care (Patient was  unable to ambulate with physical therapy on 9/24 due to BP drop in standing) Verbally Normal                       Personal Care Assistance Level of Assistance  Bathing, Feeding, Dressing Bathing Assistance:  Limited assistance Feeding assistance: Independent Dressing Assistance: Limited assistance     Functional Limitations Info  Sight, Hearing, Speech Sight Info: Impaired Hearing Info: Adequate Speech Info: Adequate    SPECIAL CARE FACTORS FREQUENCY  PT (By licensed PT)     PT Frequency: Evaluated 9/24 and a minimum of 3X per week therapy recommended while in hospital              Contractures Contractures Info: Not present    Additional Factors Info  Code Status, Allergies Code Status Info: Full Allergies Info: Aspirin, Pencillins, Amlodipine, Bactrim, Contract media, Iron, Nitrofurantoin, Tylenol, Gabapentin, Dexilant, Levaquin, Morphine and related, Plavix, Protonix, Venofer           Current Medications (04/04/2017):  This is the current hospital active medication list Current Facility-Administered Medications  Medication Dose Route Frequency Provider Last Rate Last Dose  . ALPRAZolam Duanne Moron) tablet 0.25 mg  0.25 mg Oral BID PRN Theodis Blaze, MD   0.25 mg at 04/02/17 2311  . amiodarone (PACERONE) tablet 200 mg  200 mg Oral Daily Theodis Blaze, MD   200 mg at 04/04/17 1100  . aspirin EC tablet 81 mg  81 mg Oral Daily Theodis Blaze, MD   81 mg at 04/04/17 1100  . bisacodyl (DULCOLAX) suppository 10 mg  10 mg Rectal Daily PRN Theodis Blaze, MD   10 mg at 03/23/17 1035  . bisacodyl (DULCOLAX) suppository 10 mg  10 mg Rectal q12n4p Aviva Signs, MD   10 mg at 04/03/17 2221  . calcium carbonate (TUMS - dosed in mg elemental calcium) chewable tablet 200 mg of elemental calcium  1 tablet Oral Q6H PRN Walden Field A, NP   200 mg of elemental calcium at 04/02/17 1451  . camphor-menthol (SARNA) lotion   Topical PRN Theodis Blaze, MD      . carvedilol (COREG) tablet 6.25 mg  6.25 mg Oral BID WC Theodis Blaze, MD   6.25 mg at 04/04/17 0851  . cinacalcet (SENSIPAR) tablet 30 mg  30 mg Oral QPC supper Theodis Blaze, MD   30 mg at 04/03/17 1752  . cloNIDine (CATAPRES) tablet 0.1  mg  0.1 mg Oral Daily Barton Dubois, MD      . Darbepoetin Alfa (ARANESP) injection 60 mcg  60 mcg Intravenous Q Jeanette Caprice, PA-C   60 mcg at 03/31/17 1151  . diphenhydrAMINE (BENADRYL) capsule 25 mg  25 mg Oral Q6H PRN Theodis Blaze, MD   25 mg at 03/28/17 1850  . doxercalciferol (HECTOROL) injection 2.5 mcg  2.5 mcg Intravenous Q M,W,F-HD Alric Seton, PA-C   2.5 mcg at 03/31/17 1151  . famotidine (PEPCID) tablet 20 mg  20 mg Oral Angelique Holm, MD   20 mg at 04/03/17 1558  . fluticasone (FLONASE) 50 MCG/ACT nasal spray 2 spray  2 spray Each Nare QHS PRN Theodis Blaze, MD      . heparin injection 5,000 Units  5,000 Units Subcutaneous Q8H Monia Sabal, PA-C   5,000 Units at 04/02/17 2312  . hydrALAZINE (APRESOLINE) injection 5 mg  5 mg Intravenous Q4H PRN Theodis Blaze, MD   5 mg at  03/22/17 1635  . hydrALAZINE (APRESOLINE) tablet 50 mg  50 mg Oral Q8H Susan Arana, Jimmy Picket, MD      . iopamidol (ISOVUE-370) 76 % injection 100 mL  100 mL Intravenous Once PRN Vernell Leep D, MD      . isosorbide mononitrate (IMDUR) 24 hr tablet 120 mg  120 mg Oral Daily Theodis Blaze, MD   120 mg at 04/04/17 1100  . lidocaine-prilocaine (EMLA) cream   Topical Q M,W,F-HD Alric Seton, PA-C      . multivitamin (RENA-VIT) tablet 1 tablet  1 tablet Oral QHS Modena Jansky, MD   1 tablet at 04/03/17 2201  . nitroGLYCERIN (NITROSTAT) SL tablet 0.4 mg  0.4 mg Sublingual Q5 min PRN Theodis Blaze, MD   0.4 mg at 04/01/17 2113  . ondansetron (ZOFRAN) injection 4 mg  4 mg Intravenous Q6H PRN Theodis Blaze, MD      . oxyCODONE (Oxy IR/ROXICODONE) immediate release tablet 5 mg  5 mg Oral Q6H PRN Theodis Blaze, MD   5 mg at 04/02/17 2312  . sevelamer carbonate (RENVELA) tablet 800 mg  800 mg Oral TID WC Alric Seton, PA-C   800 mg at 04/04/17 0851  . ticagrelor (BRILINTA) tablet 90 mg  90 mg Oral BID Theodis Blaze, MD   90 mg at 04/04/17 1100     Discharge Medications: Please see  discharge summary for a list of discharge medications.  Relevant Imaging Results:  Relevant Lab Results:   Additional Information ss#[id number].  Dialysis patient MWF at [facility], Mila Homer, LCSW

## 2017-04-04 NOTE — Progress Notes (Signed)
[facility] Progress Note  Dialysis Orders:[name] EDW 56.5kg 3 hours 45 min profile 2 HD Bath 2K/2.5 calc, Heparin yes- 2000 bolus+ 200 per hour right .IJ TDCand left upper AVGG 02/24/17 - Dr. Ronalee Belts placed PC 350 BFR, 600 DFR- BP will drop fast according to nursing. Epo 7800 per HD , hectorol 2.12mcg q HD, venofer 50 , Epo 7800 , fosrenol 1000 TID for phos binding   Assessment/Plan: 1. Abdominal pain- per gen surg, she has short segment SB thickening and small surrounding focus of air, c/w contained perforation or SB diverticulitis; not ammenable to percutaneous drain; on IV ertapenem IM-pain resolved - off abtx- surg signed off 2. ESRD- [facility]- Used AVG with 15 gauge - second use - no problems; cont to use access- will keep PC in for OP unit to make sure that they are successful with stick 3.  Hypertension/volume- attempt to keep systolic BP >944 due to risk of mesenteric ischemia net UF 894 9/17  post wt 55.8; net UF 2 L 9/19  post wt 56.2; Net UF 2 L 9/21  post weight again 56.2; BP high probably needs more volume down - likely has lost some weight- post weight yest 57.6  BP is lowish- is on coreg, hydralazine and clonidine?  Wrote order to hold clonidine if BP is low- may be able to do without 4. Anemia- hgb 11.2 stable on Epo 7800 TIW prior to admissioncontinue weekly Fe; on Aranesp 60 here q Friday 5. Metabolic bone disease- Continue hectorol/sensipar/sevelamer P 6- no change  6. Nutrition-alb 2.6 - advanced to renal 7. CAD/hx NSTEMI- EKG neg acute- CXR neg- resolved with NTG - troponin ordered 8. Renal mass - 1.6 cm left kidney nodule new in 5/18- stable 02/2017, right kidney nodule 2.2 cm 2014 to 2.9 11/2016- stable 02/2017-  had a urology appointment for evaluation but missed it due to a hospitalization and the appointment was never rescheduled - this will need to be done after discharge. 9. Dispo- close to discharge- wrote orders for HD here  tomorrow if stays but will be ready to communicate with [facility] when she does go    Shawna [name] A  04/04/2017,12:01 PM  LOS: 13 days      Subjective:   HD yeat- removed 2 liters- had some low BPs but no symptoms- in good spirits, eating and having Bm's "they think I might need rehab "I have rubber legs "   Objective Vitals:   04/03/17 1823 04/03/17 2155 04/04/17 0626 04/04/17 0750  BP: (!) 99/40 (!) 127/42 (!) 129/58 131/60  Pulse: 75 78 79 81  Resp:  16 17 18   Temp:  98.5 F (36.9 C) 98.6 F (37 C) 98.5 F (36.9 C)  TempSrc:  Oral Oral Oral  SpO2: 100% 100% 100% 100%  Weight:  57.6 kg (126 lb 15.8 oz)    Height:       Physical Exam General: NAD Heart: RRR Lungs: crackles at bases Abdomen: soft NT Extremities: no LE edema Dialysis Access: right IJ and left upper AVGG + bruit   Additional Objective Labs: Basic Metabolic Panel:  Recent Labs Lab 03/29/17 1519 03/31/17 0753 04/01/17 0228  NA 130* 127* 131*  K 4.2 4.3 3.7  CL 93* 89* 94*  CO2 20* 25 26  GLUCOSE 115* 77 119*  BUN 37* 26* 10  CREATININE 7.61* 6.81* 4.46*  CALCIUM 7.2* 6.9* 7.5*  PHOS 4.3 6.0*  --    Liver Function Tests:  Recent Labs  Lab 03/29/17 1519 03/31/17 0753  ALBUMIN 2.6* 2.6*   No results for input(s): LIPASE, AMYLASE in the last 168 hours. CBC:  Recent Labs Lab 03/29/17 1520 03/31/17 0753 03/31/17 1854 04/01/17 0228  WBC 9.0 5.9 5.6 5.5  NEUTROABS  --   --   --  3.7  HGB 9.1* 10.2* 11.9* 11.2*  HCT 28.1* 30.9* 37.0 33.8*  MCV 99.3 97.8 100.0 100.3*  PLT 182 217 202 215   Blood Culture    Component Value Date/Time   SDES BLOOD RIGHT ARM 10/10/2016 0124   SPECREQUEST IN PEDIATRIC BOTTLE Blood Culture adequate volume 10/10/2016 0124   CULT NO GROWTH 5 DAYS 10/10/2016 0124   REPTSTATUS 10/15/2016 FINAL 10/10/2016 0124    Cardiac Enzymes:  Recent Labs Lab 03/29/17 1520  TROPONINI 0.16*   CBG:  Recent Labs Lab 04/03/17 0739 04/03/17 1346  04/03/17 1646 04/03/17 2152 04/04/17 0754  GLUCAP 89 181* 154* 117* 84   Iron Studies: No results for input(s): IRON, TIBC, TRANSFERRIN, FERRITIN in the last 72 hours. Lab Results  Component Value Date   INR 0.98 03/28/2017   INR 1.01 02/08/2017   INR 1.05 01/24/2017   Studies/Results: No results found. Medications:  . amiodarone  200 mg Oral Daily  . aspirin EC  81 mg Oral Daily  . bisacodyl  10 mg Rectal q12n4p  . carvedilol  6.25 mg Oral BID WC  . cinacalcet  30 mg Oral QPC supper  . cloNIDine  0.1 mg Oral Daily  . darbepoetin (ARANESP) injection - DIALYSIS  60 mcg Intravenous Q Fri-HD  . doxercalciferol  2.5 mcg Intravenous Q M,W,F-HD  . famotidine  20 mg Oral QODAY  . heparin  5,000 Units Subcutaneous Q8H  . hydrALAZINE  50 mg Oral Q8H  . isosorbide mononitrate  120 mg Oral Daily  . lidocaine-prilocaine   Topical Q M,W,F-HD  . multivitamin  1 tablet Oral QHS  . sevelamer carbonate  800 mg Oral TID WC  . ticagrelor  90 mg Oral BID

## 2017-04-04 NOTE — Clinical Social Work Note (Signed)
Patient preference is [facility], however CSW talked with patient and husband regarding probable delay in authorization and [facility] presented as facility that will work with hospital to accept patient when medically stable. CSW talked with Cleon Dew regarding LOG and they will accept a 5-Day LOG. During conversation with [facility] SN[facility] admissions staff person Threasa Beards, [place] informed that they would not be able to accept a LOG. Consulted with Surveyor, quantity Nathaniel Man and LOG approved. Patient agreeable to [facility] even though her first choice is [facility].   CSW advised this morning that patient medically stable for discharge, however later this afternoon CSW informed by nurse that MD not discharging patient today. CSW will continue to follow and facilitate discharge to [facility] when medically stable.  Shawna [name], MSW, LCSW Licensed Clinical Social Worker [place] [phone]

## 2017-04-04 NOTE — Clinical Social Work Placement (Signed)
   CLINICAL SOCIAL WORK PLACEMENT  NOTE  Date:  04/04/2017  Patient Details  Name: Shawna [name] MRN: [id number] Date of Birth: 1940-09-19  Clinical Social Work is seeking post-discharge placement for this patient at the [place] level of care (*CSW will initial, date and re-position this form in  chart as items are completed):  No (Patient provided CSW with facility preference)   Patient/family provided with [name] Work Department's list of facilities offering this level of care within the geographic area requested by the patient (or if unable, by the patient's family).  Yes   Patient/family informed of their freedom to choose among providers that offer the needed level of care, that participate in Medicare, Medicaid or managed care program needed by the patient, have an available bed and are willing to accept the patient.  No   Patient/family informed of [facility]'s ownership interest in [facility] and [facility], as well as of the fact that they are under no obligation to receive care at these facilities.  PASRR submitted to EDS on 04/04/17     PASRR number received on 04/04/17     Existing PASRR number confirmed on       FL2 transmitted to all facilities in geographic area requested by pt/family on 04/04/17     FL2 transmitted to all facilities within larger geographic area on       Patient informed that his/her managed care company has contracts with or will negotiate with certain facilities, including the following:        Yes   Patient/family informed of bed offers received.  Patient chooses bed at [facility]     Physician recommends and patient chooses bed at      Patient to be transferred to [facility] on  .  Patient to be transferred to facility by       Patient family notified on   of transfer.  Name of family member notified:        PHYSICIAN       Additional Comment:     _______________________________________________ Sable Feil, LCSW 04/04/2017, 8:48 PM

## 2017-04-04 NOTE — Clinical Social Work Note (Signed)
Received 30-day PASRR number for patient - [id number]; effective 04/04/2017 - 05/04/2017.  Shawna [name], MSW, LCSW Licensed Clinical Social Worker [place] [phone]

## 2017-04-04 NOTE — Progress Notes (Addendum)
Physical Therapy Treatment Patient Details Name: Shawna [name] MRN: [id number] DOB: 10-14-1940 Today's Date: 04/04/2017    History of Present Illness 76 year old married female, PMH of ESRD on MWF HD (Dr. Lowanda Foster, Nephrology), CAD/NSTEMI/PCI, chronic diastolic CHF (Dr. Domenic Polite, Cardiology), HTN, HLD, iron deficiency anemia, GI bleed due to small bowel angiodysplasia, initially admitted to [facility] on 03/22/17 for acute onset of lower abdominal pain and diarrhea. CT revealed focal enteritis-diverticulitis and question of contained perforation. GI and general surgery consulted and initially managed conservatively for presumed enteritis with bowel rest and IV antibiotics. Due to ongoing postprandial abdominal pain, suspected bowel ischemia and underwent an abdominal Doppler which showed more than 70% stenosis of all 3 mesenteric vessels at their origins. GI/Gen. surgery discussed with IR at [facility] and patient was transferred over on 9/16 for for possible stenting of mesenteric vessels. IR and nephrology consulted at [facility].CT abdomen 9/18 showed mesenteric vessel stenosis but not enough to require stenting as IR, did reveal new intra-abdominal abscess, Gen. surgery consulted, and managing conservatively    PT Comments    Continuing work on functional mobility and activity tolerance;  Noting improved activity tolerance, with orthostatic BPs improved as follows:     04/04/17 1523  Orthostatic Lying   BP- Lying (!) 115/38  Pulse- Lying 71  Orthostatic Sitting  BP- Sitting 125/49  Pulse- Sitting 73  Orthostatic Standing at 0 minutes  BP- Standing at 0 minutes 133/77  Pulse- Standing at 0 minutes 77  Orthostatic Standing at 3 minutes  BP- Standing at 3 minutes 177/59  Pulse- Standing at 3 minutes 79          Follow Up Recommendations  SNF     Equipment Recommendations  Rolling walker with 5" wheels;3in1 (PT)    Recommendations for Other Services        Precautions / Restrictions Precautions Precautions: Fall Precaution Comments: Orthostatic hypotension -- watch BPs    Mobility  Bed Mobility Overal bed mobility: Needs Assistance Bed Mobility: Supine to Sit     Supine to sit: Min assist     General bed mobility comments: [place] assist for safety; able to push self up to sitting; min assist to square off hips at EOB  Transfers Overall transfer level: Needs assistance Equipment used: Rolling walker (2 wheeled) Transfers: Sit to/from Stand Sit to Stand: Min assist         General transfer comment: Cues for hand placement and safety  Ambulation/Gait Ambulation/Gait assistance: Min assist Ambulation Distance (Feet):  (pivot steps bed to chair) Assistive device: Rolling walker (2 wheeled) Gait Pattern/deviations: Shuffle     General Gait Details: Min assist to steady; post lean present, not as bad as yesterday   Stairs            Wheelchair Mobility    Modified Rankin (Stroke Patients Only)       Balance     Sitting balance-Leahy Scale: Fair       Standing balance-Leahy Scale: Poor                              Cognition Arousal/Alertness: Awake/alert Behavior During Therapy: WFL for tasks assessed/performed Overall Cognitive Status: Within Functional Limits for tasks assessed  Exercises      General Comments General comments (skin integrity, edema, etc.):       Pertinent Vitals/Pain Pain Assessment: No/denies pain    Home Living                      Prior Function            PT Goals (current goals can now be found in the care plan section) Acute Rehab PT Goals Patient Stated Goal: wants to feel well enough to go home PT Goal Formulation: With patient Time For Goal Achievement: 04/17/17 Potential to Achieve Goals: Good Progress towards PT goals: Progressing toward goals    Frequency    Min  3X/week      PT Plan Current plan remains appropriate    Co-evaluation              AM-PAC PT "6 Clicks" Daily Activity  Outcome Measure  Difficulty turning over in bed (including adjusting bedclothes, sheets and blankets)?: A Little Difficulty moving from lying on back to sitting on the side of the bed? : A Little Difficulty sitting down on and standing up from a chair with arms (e.g., wheelchair, bedside commode, etc,.)?: Unable Help needed moving to and from a bed to chair (including a wheelchair)?: A Little Help needed walking in hospital room?: A Little Help needed climbing 3-5 steps with a railing? : A Lot 6 Click Score: 15    End of Session Equipment Utilized During Treatment: Gait belt Activity Tolerance: Patient tolerated treatment well Patient left: in chair;with call bell/phone within reach Nurse Communication: Mobility status PT Visit Diagnosis: Unsteadiness on feet (R26.81);Other abnormalities of gait and mobility (R26.89);Muscle weakness (generalized) (M62.81)     Time: 1520-1550 PT Time Calculation (min) (ACUTE ONLY): 30 min  Charges:      04/04/17 1600  PT General Charges  $$ ACUTE PT VISIT 1 Visit  PT Treatments  $Therapeutic Activity 23-37 mins                G Codes:       Roney Marion, PT  Acute Rehabilitation Services Pager [phone] Office [phone]    Colletta [place] 04/04/2017, 4:35 PM

## 2017-04-05 DIAGNOSIS — D631 Anemia in chronic kidney disease: Secondary | ICD-10-CM | POA: Diagnosis not present

## 2017-04-05 DIAGNOSIS — K559 Vascular disorder of intestine, unspecified: Secondary | ICD-10-CM | POA: Diagnosis not present

## 2017-04-05 DIAGNOSIS — I82722 Chronic embolism and thrombosis of deep veins of left upper extremity: Secondary | ICD-10-CM | POA: Diagnosis not present

## 2017-04-05 DIAGNOSIS — I10 Essential (primary) hypertension: Secondary | ICD-10-CM | POA: Diagnosis not present

## 2017-04-05 DIAGNOSIS — R52 Pain, unspecified: Secondary | ICD-10-CM | POA: Diagnosis not present

## 2017-04-05 DIAGNOSIS — Z658 Other specified problems related to psychosocial circumstances: Secondary | ICD-10-CM | POA: Diagnosis not present

## 2017-04-05 DIAGNOSIS — Z23 Encounter for immunization: Secondary | ICD-10-CM | POA: Diagnosis not present

## 2017-04-05 DIAGNOSIS — K5792 Diverticulitis of intestine, part unspecified, without perforation or abscess without bleeding: Secondary | ICD-10-CM | POA: Diagnosis not present

## 2017-04-05 DIAGNOSIS — M6281 Muscle weakness (generalized): Secondary | ICD-10-CM | POA: Diagnosis not present

## 2017-04-05 DIAGNOSIS — R69 Illness, unspecified: Secondary | ICD-10-CM | POA: Diagnosis not present

## 2017-04-05 DIAGNOSIS — K529 Noninfective gastroenteritis and colitis, unspecified: Secondary | ICD-10-CM | POA: Diagnosis not present

## 2017-04-05 DIAGNOSIS — N2581 Secondary hyperparathyroidism of renal origin: Secondary | ICD-10-CM | POA: Diagnosis not present

## 2017-04-05 DIAGNOSIS — I120 Hypertensive chronic kidney disease with stage 5 chronic kidney disease or end stage renal disease: Secondary | ICD-10-CM | POA: Diagnosis not present

## 2017-04-05 DIAGNOSIS — K5649 Other impaction of intestine: Secondary | ICD-10-CM | POA: Diagnosis not present

## 2017-04-05 DIAGNOSIS — N186 End stage renal disease: Secondary | ICD-10-CM

## 2017-04-05 DIAGNOSIS — N2889 Other specified disorders of kidney and ureter: Secondary | ICD-10-CM

## 2017-04-05 DIAGNOSIS — Z992 Dependence on renal dialysis: Secondary | ICD-10-CM | POA: Diagnosis not present

## 2017-04-05 DIAGNOSIS — K5700 Diverticulitis of small intestine with perforation and abscess without bleeding: Secondary | ICD-10-CM | POA: Diagnosis not present

## 2017-04-05 DIAGNOSIS — I25118 Atherosclerotic heart disease of native coronary artery with other forms of angina pectoris: Secondary | ICD-10-CM | POA: Diagnosis not present

## 2017-04-05 DIAGNOSIS — K55019 Acute (reversible) ischemia of small intestine, extent unspecified: Secondary | ICD-10-CM | POA: Diagnosis not present

## 2017-04-05 DIAGNOSIS — K56609 Unspecified intestinal obstruction, unspecified as to partial versus complete obstruction: Secondary | ICD-10-CM | POA: Diagnosis not present

## 2017-04-05 DIAGNOSIS — I480 Paroxysmal atrial fibrillation: Secondary | ICD-10-CM | POA: Diagnosis not present

## 2017-04-05 DIAGNOSIS — I2510 Atherosclerotic heart disease of native coronary artery without angina pectoris: Secondary | ICD-10-CM | POA: Diagnosis not present

## 2017-04-05 DIAGNOSIS — D649 Anemia, unspecified: Secondary | ICD-10-CM | POA: Diagnosis not present

## 2017-04-05 DIAGNOSIS — R0602 Shortness of breath: Secondary | ICD-10-CM | POA: Diagnosis not present

## 2017-04-05 DIAGNOSIS — E785 Hyperlipidemia, unspecified: Secondary | ICD-10-CM | POA: Diagnosis not present

## 2017-04-05 LAB — BASIC METABOLIC PANEL
Anion gap: 10 (ref 5–15)
BUN: 38 mg/dL — AB (ref 6–20)
CALCIUM: 8 mg/dL — AB (ref 8.9–10.3)
CHLORIDE: 95 mmol/L — AB (ref 101–111)
CO2: 25 mmol/L (ref 22–32)
Creatinine, Ser: 8.04 mg/dL — ABNORMAL HIGH (ref 0.44–1.00)
GFR calc Af Amer: 5 mL/min — ABNORMAL LOW (ref >60)
GFR calc non Af Amer: 4 mL/min — ABNORMAL LOW (ref >60)
Glucose, Bld: 80 mg/dL (ref 65–99)
Potassium: 4 mmol/L (ref 3.5–5.1)
Sodium: 130 mmol/L — ABNORMAL LOW (ref 135–145)

## 2017-04-05 LAB — CBC
HCT: 25.8 % — ABNORMAL LOW (ref 36.0–46.0)
Hemoglobin: 8.6 g/dL — ABNORMAL LOW (ref 12.0–15.0)
MCH: 33 pg (ref 26.0–34.0)
MCHC: 33.3 g/dL (ref 30.0–36.0)
MCV: 98.9 fL (ref 78.0–100.0)
PLATELETS: 218 10*3/uL (ref 150–400)
RBC: 2.61 MIL/uL — AB (ref 3.87–5.11)
RDW: 17.4 % — AB (ref 11.5–15.5)
WBC: 6.1 10*3/uL (ref 4.0–10.5)

## 2017-04-05 LAB — GLUCOSE, CAPILLARY: Glucose-Capillary: 69 mg/dL (ref 65–99)

## 2017-04-05 MED ORDER — LIDOCAINE HCL (PF) 1 % IJ SOLN: 5.0000 mL | INTRAMUSCULAR | Status: DC | PRN

## 2017-04-05 MED ORDER — ALPRAZOLAM 0.25 MG PO TABS: 0.2500 mg | ORAL_TABLET | Freq: Two times a day (BID) | ORAL | 0 refills | Status: DC | PRN

## 2017-04-05 MED ORDER — FAMOTIDINE 20 MG PO TABS
20.0000 mg | ORAL_TABLET | ORAL | 0 refills | Status: DC
Start: 2017-04-05 — End: 2017-07-01

## 2017-04-05 MED ORDER — HEPARIN SODIUM (PORCINE) 1000 UNIT/ML DIALYSIS: 1000.0000 [IU] | INTRAMUSCULAR | Status: DC | PRN

## 2017-04-05 MED ORDER — HEPARIN SODIUM (PORCINE) 1000 UNIT/ML DIALYSIS: 20.0000 [IU]/kg | INTRAMUSCULAR | Status: DC | PRN

## 2017-04-05 MED ORDER — PENTAFLUOROPROP-TETRAFLUOROETH EX AERO: 1.0000 "application " | INHALATION_SPRAY | CUTANEOUS | Status: DC | PRN

## 2017-04-05 MED ORDER — OXYCODONE HCL 5 MG PO CAPS: 5.0000 mg | ORAL_CAPSULE | Freq: Four times a day (QID) | ORAL | 0 refills | Status: DC | PRN

## 2017-04-05 MED ORDER — SODIUM CHLORIDE 0.9 % IV SOLN: 100.0000 mL | INTRAVENOUS | Status: DC | PRN

## 2017-04-05 MED ORDER — HYDRALAZINE HCL 25 MG PO TABS: 25.0000 mg | ORAL_TABLET | Freq: Two times a day (BID) | ORAL | 0 refills | Status: DC

## 2017-04-05 MED ORDER — LIDOCAINE-PRILOCAINE 2.5-2.5 % EX CREA: 1.0000 "application " | TOPICAL_CREAM | CUTANEOUS | Status: DC | PRN

## 2017-04-05 MED ORDER — ALTEPLASE 2 MG IJ SOLR: 2.0000 mg | Freq: Once | INTRAMUSCULAR | Status: DC | PRN

## 2017-04-05 NOTE — Progress Notes (Signed)
Report given to I[place] at [facility].

## 2017-04-05 NOTE — Progress Notes (Signed)
[facility] Progress Note  Dialysis Orders:[name] EDW 56.5kg 3 hours 45 min profile 2 HD [place] 2K/2.5 calc, Heparin yes- 2000 bolus+ 200 per hour right .IJ TDCand left upper AVGG 02/24/17 - Dr. Ronalee Belts placed PC 350 BFR, 600 DFR- BP will drop fast according to nursing. Epo 7800 per HD , hectorol 2.40mcg q HD, venofer 50 , Epo 7800 , fosrenol 1000 TID for phos binding   Assessment/Plan: 1. Abdominal pain- per gen surg, she has short segment SB thickening and small surrounding focus of air, c/w contained perforation or SB diverticulitis; not ammenable to percutaneous drain; on IV ertapenem IM-pain resolved - off abtx- surg signed off 2. [facility]- [facility]- Used AVG with 15 gauge - third use - no problems; cont to use access- will keep PC in for OP unit to make sure that they are successful with stick 3.  Hypertension/volume- attempt to keep systolic BP >465 due to risk of mesenteric ischemia net UF 894 9/17  post wt 55.8; net UF 2 L 9/19  post wt 56.2; Net UF 2 L 9/21  post weight again 56.2; BP high probably needs more volume down - likely has lost some weight- post weight yest 57.6  BP is lowish- was on coreg, hydralazine and clonidine- clonidine stopped this admit- may be able to wean further as have not been able to pull volume due to BP  4. Anemia- hgb 11.2 dropped supposedly to 8's this AM (had not been checked for 4 days) ? No obvious bleeding-  on Epo 7800 TIW prior to admissioncontinue weekly Fe; on Aranesp 60 here q Friday- will likely need adjust up at discharge  5. Metabolic bone disease- Continue hectorol/sensipar/sevelamer P 6- no change  6. Nutrition-alb 2.6 - advanced to renal 7. CAD/hx NSTEMI- EKG neg acute- CXR neg- resolved with NTG - troponin ordered 8. Renal mass - 1.6 cm left kidney nodule new in 5/18- stable 02/2017, right kidney nodule 2.2 cm 2014 to 2.9 11/2016- stable 02/2017-  had a urology appointment for evaluation but missed it due to  a hospitalization and the appointment was never rescheduled - this will need to be done after discharge. 9. Dispo- close to discharge-  HD here today -  will be ready to communicate with [facility] when she does go    Shawna [name] A  04/05/2017,9:10 AM  LOS: 14 days      Subjective:   Seen on HD-  BP stable- clonidine stopped- in good spirits, eating and having Bm's "they think I might need rehab "I have rubber legs " Planning to discharge to [facility] today   Objective Vitals:   04/04/17 1500 04/04/17 1841 04/04/17 2100 04/05/17 0429  BP: (!) 102/30 (!) 130/43 (!) 127/41 (!) 123/42  Pulse:  70 78 69  Resp:  20 18 16   Temp:  98.1 F (36.7 C) 98.1 F (36.7 C) 98.2 F (36.8 C)  TempSrc:  Oral Oral Oral  SpO2:  100% 100% 100%  Weight:   57.5 kg (126 lb 12.2 oz)   Height:       Physical Exam General: NAD Heart: RRR Lungs: crackles at bases Abdomen: soft NT Extremities: no LE edema Dialysis Access: right IJ and left upper AVGG + bruit   Additional Objective Labs: Basic Metabolic Panel:  Recent Labs Lab 03/29/17 1519 03/31/17 0753 04/01/17 0228 04/05/17 0258  NA 130* 127* 131* 130*  K 4.2 4.3 3.7 4.0  CL 93* 89* 94* 95*  CO2  20* 25 26 25   GLUCOSE 115* 77 119* 80  BUN 37* 26* 10 38*  CREATININE 7.61* 6.81* 4.46* 8.04*  CALCIUM 7.2* 6.9* 7.5* 8.0*  PHOS 4.3 6.0*  --   --    Liver Function Tests:  Recent Labs Lab 03/29/17 1519 03/31/17 0753  ALBUMIN 2.6* 2.6*   No results for input(s): LIPASE, AMYLASE in the last 168 hours. CBC:  Recent Labs Lab 03/29/17 1520 03/31/17 0753 03/31/17 1854 04/01/17 0228 04/05/17 0258  WBC 9.0 5.9 5.6 5.5 6.1  NEUTROABS  --   --   --  3.7  --   HGB 9.1* 10.2* 11.9* 11.2* 8.6*  HCT 28.1* 30.9* 37.0 33.8* 25.8*  MCV 99.3 97.8 100.0 100.3* 98.9  PLT 182 217 202 215 218   Blood Culture    Component Value Date/Time   SDES BLOOD RIGHT ARM 10/10/2016 0124   SPECREQUEST IN PEDIATRIC BOTTLE Blood Culture  adequate volume 10/10/2016 0124   CULT NO GROWTH 5 DAYS 10/10/2016 0124   REPTSTATUS 10/15/2016 FINAL 10/10/2016 0124    Cardiac Enzymes:  Recent Labs Lab 03/29/17 1520  TROPONINI 0.16*   CBG:  Recent Labs Lab 04/03/17 2152 04/04/17 0754 04/04/17 1227 04/04/17 1716 04/04/17 2218  GLUCAP 117* 84 111* 94 104*   Iron Studies: No results for input(s): IRON, TIBC, TRANSFERRIN, FERRITIN in the last 72 hours. Lab Results  Component Value Date   INR 0.98 03/28/2017   INR 1.01 02/08/2017   INR 1.05 01/24/2017   Studies/Results: No results found. Medications:  . amiodarone  200 mg Oral Daily  . aspirin EC  81 mg Oral Daily  . bisacodyl  10 mg Rectal q12n4p  . carvedilol  6.25 mg Oral BID WC  . cinacalcet  30 mg Oral QPC supper  . darbepoetin (ARANESP) injection - DIALYSIS  60 mcg Intravenous Q Fri-HD  . doxercalciferol  2.5 mcg Intravenous Q M,W,F-HD  . famotidine  20 mg Oral QODAY  . heparin  5,000 Units Subcutaneous Q8H  . hydrALAZINE  25 mg Oral BID  . isosorbide mononitrate  120 mg Oral Daily  . lidocaine-prilocaine   Topical Q M,W,F-HD  . multivitamin  1 tablet Oral QHS  . sevelamer carbonate  800 mg Oral TID WC  . ticagrelor  90 mg Oral BID

## 2017-04-05 NOTE — Procedures (Signed)
Patient was seen on dialysis and the procedure was supervised.  BFR 400  Via AVG BP is  125/51.   Patient appears to be tolerating treatment well  Shawna [name] A 04/05/2017

## 2017-04-05 NOTE — Progress Notes (Signed)
Discharge to:  [facility] Anticipated discharge date: 04/05/17 Transportation by: [facility]  Report #: [phone], Room 101  [place] signing off.  Laveda Abbe LCSW [phone]

## 2017-04-05 NOTE — Discharge Summary (Addendum)
Physician Discharge Summary  DALILA ARCA VEH:[id number] DOB: 26-Jun-1941 DOA: 03/22/2017  PCP: [name], MD  Admit date: 03/22/2017 Discharge date: 04/05/2017  Admitted From: Home Disposition:  SNF  Recommendations for :  1. Follow up with PCP in 1- week 2. Patient completed antibiotic therapy in the hospital 3. 2.9 cm right renal mass, will need outpatient follow-up  Home Health: NA  Equipment/Devices: NA  Discharge Condition: Stable CODE STATUS: full  Diet recommendation: Renal diet  Brief/Interim Summary: 76 year old female who presented with abdominal pain. She does have a significant past medical history of end-stage renal disease on hemodialysis, coronary artery disease, hypertension, diastolic heart failure and gastrointestinal bleed due to arteriovenous malformations. Presents with 2 days of abdominal pain, progressive and worsening, associated with diarrhea, nausea, radiated across the abdomen and worse with deep inspiration. On initial physical examination, blood pressure 174/61, heart rate 69, respiratory rate 14, temperature 98.4, oxygen saturations 98%. Moist mucous membranes, lungs were clear to auscultation bilaterally, heart S1-S2 present and rhythmic, her abdomen was tender in the epigastric region, no guarding or rebound, no lower extremity edema. CT of the abdomen showed inflamed small bowel surrounding a focus of air extending beyond the bowel wall which may represent a contained perforation or small bowel diverticulitis.  Patient was admitted to hospital with working diagnosis of possible enteritis. Initially admitted to [facility], placed on conservative medical therapy including IV fluids, analgesics and IV antibiotics. She was diagnosed with mesenteric stenosis, transferred to [facility], 9/16, for csrdiovascular evaluation. She was determined not to be a good candidate for endovascular therapy. Further imaging revealed intra-abdominal abscesses.  1.  Small bowel enteritis with mesenteric ischemia and intra-abdominal abscesses. Patient was treated conservatively, abdominal abscess were considered not to be good candidate for percutaneous drainage. Patient received IV antibiotic therapy with ertapenem, she completed the full 10 day course during her hospitalization. Her diet was advanced progressively with good toleration. General surgery was consulted.  2. End-stage renal disease on hemodialysis. Patient was continued on renal replacement therapy during her hospitalization, no major complications. Patient was continued on phosphate binders.  3. Left upper extremity chronic venous thrombosis, cephalic vein. Patient was treated conservatively.  4. Hypertension. Blood pressure regimen was modified to prevent further hypotension, Coumadin has been discontinued, hydralazine has been decreased to 25 g twice daily.  5. Dyslipidemia. Continue with statin.  6. Coronary artery disease. Patient will continue aspirin and ticagrelor. Continue Imdur, Coreg and amiodarone.   7. Paroxysmal atrial fibrillation. Rate control, continue amiodarone, anticoagulation with aspirin and ticagrelor.   8. Right renal mass. 2.9 cm, will need outpatient follow-up.  Discharge Diagnoses:  Active Problems:   SBO (small bowel obstruction) (HCC)   Enteritis   Diverticulitis   Mesenteric ischemia ([place])    Discharge Instructions   Allergies as of 04/05/2017      Reactions   Aspirin Other (See Comments)   High Doses Mess up her stomach; "makes my bowels have blood in them". Takes 81 mg EC Aspirin    Penicillins Other (See Comments)   SYNCOPE? , "makes me real weak when I take it; like I'll pass out" Has patient had a PCN reaction causing immediate rash, facial/tongue/throat swelling, SOB or lightheadedness with hypotension: Yes Has patient had a PCN reaction causing severe rash involving mucus membranes or skin necrosis: no Has patient had a PCN reaction that  required hospitalization no Has patient had a PCN reaction occurring within the last 10 years: no If all of  the above   Amlodipine Swelling   Bactrim [sulfamethoxazole-trimethoprim] Rash   Contrast Media [iodinated Diagnostic Agents] Itching   Iron Itching, Other (See Comments)   "they gave me iron in dialysis; had to give me Benadryl cause I had to have the iron" (05/02/2012)   Nitrofurantoin Hives   Tylenol [acetaminophen] Itching, Other (See Comments)   Makes her feet on fire per pt   Gabapentin Other (See Comments)   unspecified   Dexilant [dexlansoprazole] Other (See Comments)   Upset stomach   Levaquin [levofloxacin In D5w] Rash   Morphine And Related Itching   Itching in feet   Plavix [clopidogrel Bisulfate] Rash   Protonix [pantoprazole Sodium] Rash   Venofer [ferric Oxide] Itching   Patient reports using Benadryl prior to doses as [place]      Medication List    STOP taking these medications   cloNIDine 0.2 MG tablet Commonly known as:  CATAPRES   omeprazole 20 MG capsule Commonly known as:  PRILOSEC     TAKE these medications   ALPRAZolam 0.25 MG tablet Commonly known as:  XANAX Take 1 tablet (0.25 mg total) by mouth 2 (two) times daily as needed for anxiety or sleep.   amiodarone 200 MG tablet Commonly known as:  PACERONE Take 1 tablet (200 mg total) by mouth daily.   aspirin EC 81 MG tablet Take 81 mg by mouth daily.   carvedilol 6.25 MG tablet Commonly known as:  COREG Take 1 tablet (6.25 mg total) by mouth 2 (two) times daily with a meal.   cinacalcet 30 MG tablet Commonly known as:  SENSIPAR Take 30 mg by mouth daily after supper.   diphenhydrAMINE 25 mg capsule Commonly known as:  BENADRYL Take 50 mg by mouth daily as needed for itching or allergies.   epoetin alfa 10000 UNIT/ML injection Commonly known as:  EPOGEN,PROCRIT Inject 1 mL (10,000 Units total) into the vein every Monday, Wednesday, and Friday with hemodialysis.    famotidine 20 MG tablet Commonly known as:  PEPCID Take 1 tablet (20 mg total) by mouth every other day.   fluticasone 50 MCG/ACT nasal spray Commonly known as:  FLONASE Place 2 sprays into the nose at bedtime as needed for allergies.   hydrALAZINE 25 MG tablet Commonly known as:  APRESOLINE Take 1 tablet (25 mg total) by mouth 2 (two) times daily. What changed:  medication strength  how much to take  when to take this   isosorbide mononitrate 120 MG 24 hr tablet Commonly known as:  IMDUR Take 1 tablet (120 mg total) by mouth daily.   multivitamin Tabs tablet Take 1 tablet by mouth daily.   nitroGLYCERIN 0.4 MG SL tablet Commonly known as:  NITROSTAT Place 1 tablet (0.4 mg total) under the tongue every 5 (five) minutes as needed for chest pain.   ondansetron 4 MG disintegrating tablet Commonly known as:  ZOFRAN-ODT Take 4 mg by mouth 2 (two) times daily as needed for nausea or vomiting.   oxycodone 5 MG capsule Commonly known as:  OXY-IR Take 1 capsule (5 mg total) by mouth every 6 (six) hours as needed for pain.   sevelamer carbonate 800 MG tablet Commonly known as:  RENVELA Take 800 mg by mouth 3 (three) times daily with meals.   simvastatin 20 MG tablet Commonly known as:  ZOCOR TAKE ONE TABLET BY MOUTH AT BEDTIME   ticagrelor 90 MG Tabs tablet Commonly known as:  BRILINTA Take 90 mg by mouth 2 (  two) times daily.            Discharge Care Instructions        Start     Ordered   04/05/17 0000  ALPRAZolam (XANAX) 0.25 MG tablet  2 times daily PRN     04/05/17 1029   04/05/17 0000  famotidine (PEPCID) 20 MG tablet  Every other day     04/05/17 1029   04/05/17 0000  hydrALAZINE (APRESOLINE) 25 MG tablet  2 times daily     04/05/17 1029   04/05/17 0000  oxycodone (OXY-IR) 5 MG capsule  Every 6 hours PRN     04/05/17 1029   04/05/17 0000  Increase activity slowly     04/05/17 1029   04/05/17 0000  Diet - low sodium heart healthy     04/05/17 1029    04/05/17 0000  Discharge instructions    Comments:  Please follow up with primary care in 7 days.   04/05/17 1029     Contact information for after-discharge care    [facility] .   Specialty:  [place] information: [address] [address] [phone]             Allergies  Allergen Reactions  . Aspirin Other (See Comments)    High Doses Mess up her stomach; "makes my bowels have blood in them". Takes 81 mg EC Aspirin   . Penicillins Other (See Comments)    SYNCOPE? , "makes me real weak when I take it; like I'll pass out"  Has patient had a PCN reaction causing immediate rash, facial/tongue/throat swelling, SOB or lightheadedness with hypotension: Yes Has patient had a PCN reaction causing severe rash involving mucus membranes or skin necrosis: no Has patient had a PCN reaction that required hospitalization no Has patient had a PCN reaction occurring within the last 10 years: no If all of the above  . Amlodipine Swelling  . Bactrim [Sulfamethoxazole-Trimethoprim] Rash  . Contrast Media [Iodinated Diagnostic Agents] Itching  . Iron Itching and Other (See Comments)    "they gave me iron in dialysis; had to give me Benadryl cause I had to have the iron" (05/02/2012)  . Nitrofurantoin Hives  . Tylenol [Acetaminophen] Itching and Other (See Comments)    Makes her feet on fire per pt  . Gabapentin Other (See Comments)    unspecified  . Dexilant [Dexlansoprazole] Other (See Comments)    Upset stomach  . Levaquin [Levofloxacin In D5w] Rash  . Morphine And Related Itching    Itching in feet  . Plavix [Clopidogrel Bisulfate] Rash  . Protonix [Pantoprazole Sodium] Rash  . Venofer [Ferric Oxide] Itching    Patient reports using Benadryl prior to doses as [place]    Consultations:  Surgery  IR   Procedures/Studies: Ct Abdomen Pelvis Wo Contrast  Result Date: 03/22/2017 CLINICAL  DATA:  76 y/o F; generalized abdominal pain and abdominal distention. EXAM: CT ABDOMEN AND PELVIS WITHOUT CONTRAST TECHNIQUE: Multidetector CT imaging of the abdomen and pelvis was performed following the standard protocol without IV contrast. COMPARISON:  11/18/2016 CT of abdomen and pelvis. FINDINGS: Lower chest: Mild cardiomegaly. Severe coronary artery calcification. Left lower lobe platelike atelectasis. Hepatobiliary: No focal liver abnormality. Cholelithiasis. No intra or extrahepatic biliary ductal dilatation. Pancreas: Unremarkable. No pancreatic ductal dilatation or surrounding inflammatory changes. Spleen: Normal in size without focal abnormality. Adrenals/Urinary Tract: Normal adrenal glands. Right upper kidney mass measuring  2.9 cm and left kidney interpolar mass measuring 1.6 cm are stable. Severe atrophy of kidneys. No hydronephrosis. Bladder is collapsed. Stomach/Bowel: Patent rectosigmoid and cecal region anastomosis. Pancolonic diverticulosis. The short segment of small bowel inflammatory changes with edema extending into the mesentery centered around a small focus of air extending beyond the wall of the small bowel (series 5, image 29) which may represent a contained perforation or inflamed small bowel diverticulum. No bowel obstruction. Vascular/Lymphatic: Aortic atherosclerosis with severe calcific. No enlarged abdominal or pelvic lymph nodes. Reproductive: Status post hysterectomy. No adnexal masses. Other: Small volume of ascites within the left hemiabdomen. Musculoskeletal: No fracture is seen. IMPRESSION: 1. Short segment of inflamed small bowel surrounding a small focus of air extending beyond the bowel wall which may represent a contained perforation or small bowel diverticulitis. Trace ascites is likely reactive. 2. Mild cardiomegaly and severe coronary artery calcification. 3. Cholelithiasis. 4. Stable right kidney upper pole 2.9 cm indeterminate mass and left kidney interpolar 1.6 cm  indeterminate mass. Renal protocol CT with and without contrast is recommended to further characterize. This recommendation follows ACR consensus guidelines: Management of the Incidental Renal Mass on CT: A White Paper of the ACR Incidental Findings Committee. J Am Coll Radiol [phone]. 5. Pan colonic diverticulosis. Electronically Signed   By: Kristine Garbe M.D.   On: 03/22/2017 06:47   Dg Chest 2 View  Result Date: 03/29/2017 CLINICAL DATA:  Shortness of breath with cough. EXAM: CHEST  2 VIEW COMPARISON:  03/22/2017. FINDINGS: The heart is enlarged. There is coronary artery calcification and thoracic atherosclerosis. There is no consolidation or edema. Dual lumen catheter unchanged, tips proximal RIGHT atrium. IMPRESSION: Stable chest. Electronically Signed   By: Staci Righter M.D.   On: 03/29/2017 09:33   US Venous Img Upper Uni Left  Result Date: 03/23/2017 CLINICAL DATA:  Left hand pain since yesterday. Left hand turning cold for 2 weeks. Patient has a Gore-Tex graft in the lower arm and antecubital fossa region. EXAM: LEFT UPPER EXTREMITY VENOUS DOPPLER ULTRASOUND TECHNIQUE: Gray-scale sonography with graded compression, as well as color Doppler and duplex ultrasound were performed to evaluate the upper extremity deep venous system from the level of the subclavian vein and including the jugular, axillary, basilic, radial, ulnar and upper cephalic vein. Spectral Doppler was utilized to evaluate flow at rest and with distal augmentation maneuvers. COMPARISON:  None. FINDINGS: Contralateral Subclavian Vein: Respiratory phasicity is normal and symmetric with the symptomatic side. No evidence of thrombus. Normal compressibility. Internal Jugular Vein: No evidence of thrombus. Normal compressibility, respiratory phasicity and response to augmentation. Subclavian Vein: No evidence of thrombus. Normal compressibility, respiratory phasicity and response to augmentation. Axillary Vein: No  evidence of thrombus. Normal compressibility, respiratory phasicity and response to augmentation. Cephalic Vein: Irregular hypoechoic to mildly hyperechoic area in the left forearm near the patient's dialysis graft that may reflect a chronically thrombosed cephalic vein. Basilic Vein: No evidence of thrombus. Normal compressibility, respiratory phasicity and response to augmentation. Brachial Veins: No evidence of thrombus. Normal compressibility, respiratory phasicity and response to augmentation. Radial Veins: No evidence of thrombus. Normal compressibility, respiratory phasicity and response to augmentation. Ulnar Veins: No evidence of thrombus. Normal compressibility, respiratory phasicity and response to augmentation. Venous Reflux:  None visualized. Other Findings:  The dialysis fistula is widely patent. IMPRESSION: 1. Probable chronic thrombosis in the cephalic vein in the patient's dialysis fistula in the left arm. 2. Remaining veins are widely patent. Dialysis fistula is widely patent. Electronically Signed  By: Lajean Manes M.D.   On: 03/23/2017 15:55   Ir US Guide Vasc Access Right  Result Date: 03/28/2017 INDICATION: 76 year old with abdominal pain and being evaluated for mesenteric ischemia. Patient needs an abdominal CTA but does not have adequate venous access for the CTA examination. Patient is end-stage renal disease and not a candidate for PICC line. Patient has a left upper arm fistula which is maturing and a right jugular dialysis catheter. EXAM: PLACEMENT OF PERIPHERAL IV WITH ULTRASOUND GUIDANCE MEDICATIONS: None ANESTHESIA/SEDATION: None FLUOROSCOPY TIME:  None). COMPLICATIONS: None immediate. PROCEDURE: Right upper arm was evaluated with ultrasound. A patent basilic vein was identified. Right upper arm was prepped and draped in a sterile fashion. Tourniquet was placed. Skin was anesthetized with 1% lidocaine. 21 gauge needle directed into the right basilic vein with ultrasound guidance.  Micropuncture dilator set was placed. Catheter aspirated and flushed well. IMPRESSION: Successful placement of a peripheral IV in the right upper arm. This is intended to be a temporary access that will be removed after the CTA procedure. Electronically Signed   By: Markus Daft M.D.   On: 03/28/2017 13:35   Dg Abdomen Acute W/chest  Result Date: 03/22/2017 CLINICAL DATA:  Diffuse abdominal pain. EXAM: DG ABDOMEN ACUTE W/ 1V CHEST COMPARISON:  Radiograph 02/08/2017.  CT 11/18/2016 FINDINGS: Right-sided dialysis catheter with tip in the distal SVC. Cardiomegaly. Post median sternotomy. Bibasilar atelectasis, right greater than left. No pulmonary edema or pleural fluid. No free intra-abdominal air. No bowel dilatation to suggest obstruction. Scattered retained barium within colonic diverticula. Multiple enteric sutures and surgical clips in the pelvis. Small volume of colonic stool. IMPRESSION: 1. Normal bowel gas pattern. No free air. Retained barium within colonic diverticulum. 2. Mild bibasilar atelectasis.  Mild cardiomegaly. Electronically Signed   By: Jeb Levering M.D.   On: 03/22/2017 06:00   Ir Radiology Peripheral Guided Iv Start  Result Date: 03/28/2017 INDICATION: 76 year old with abdominal pain and being evaluated for mesenteric ischemia. Patient needs an abdominal CTA but does not have adequate venous access for the CTA examination. Patient is end-stage renal disease and not a candidate for PICC line. Patient has a left upper arm fistula which is maturing and a right jugular dialysis catheter. EXAM: PLACEMENT OF PERIPHERAL IV WITH ULTRASOUND GUIDANCE MEDICATIONS: None ANESTHESIA/SEDATION: None FLUOROSCOPY TIME:  None). COMPLICATIONS: None immediate. PROCEDURE: Right upper arm was evaluated with ultrasound. A patent basilic vein was identified. Right upper arm was prepped and draped in a sterile fashion. Tourniquet was placed. Skin was anesthetized with 1% lidocaine. 21 gauge needle directed into  the right basilic vein with ultrasound guidance. Micropuncture dilator set was placed. Catheter aspirated and flushed well. IMPRESSION: Successful placement of a peripheral IV in the right upper arm. This is intended to be a temporary access that will be removed after the CTA procedure. Electronically Signed   By: Markus Daft M.D.   On: 03/28/2017 13:35   US Abdominal Pelvic Art/vent Flow Doppler  Result Date: 03/26/2017 CLINICAL DATA:  76 year old female with possible mesenteric ischemia. End-stage renal disease Cardiovascular risk factors include hypertension, known prior coronary artery disease, known prior vascular disease with vascular surgery. EXAM: [place] MESENTERIC ARTERIAL DOPPLER COMPARISON:  Noncontrast CT 03/22/2017, 11/18/2016 FINDINGS: Celiac axis: 262 cm/sec Celiac axis with inspiration: 220 cm/sec Celiac axis with expiration: 262 cm/sec Splenic artery: Splenic artery not visualize Hepatic artery: Hepatic artery not visualized SMA: 433 cm/sec IMA: 375 cm/sec IMPRESSION: Elevated velocities at the origin of the celiac artery, superior mesenteric  artery, inferior mesenteric artery, compatible with greater than 70% stenosis at all 3 mesenteric vessels origin. Signed, Dulcy Fanny. Earleen Newport, DO [facility] Electronically Signed   By: Corrie Mckusick D.O.   On: 03/26/2017 10:46   Ct Angio Abd/pel W/ And/or W/o  Result Date: 03/28/2017 CLINICAL DATA:  Mesenteric ischemia EXAM: CTA ABDOMEN AND PELVIS wITHOUT AND WITH CONTRAST TECHNIQUE: Multidetector CT imaging of the abdomen and pelvis was performed using the standard protocol during bolus administration of intravenous contrast. Multiplanar reconstructed images and MIPs were obtained and reviewed to evaluate the vascular anatomy. CONTRAST:  80 cc Isovue 370 COMPARISON:  03/22/2017 FINDINGS: VASCULAR Aorta: There are extensive atherosclerotic calcifications throughout the aorta. There is little if any soft  plaque visualized in the lower thorax or abdomen. It is nonaneurysmal. Celiac: There is 70-80% narrowing at the origin likely due with to a combination of atherosclerosis an median arcuate ligament syndrome. Branch vessels are patent. SMA: There is 50% narrowing at the origin due to atherosclerotic plaque and calcification. Beyond the origin, it is patent. Branch vessels are grossly patent. Renals: Renal arteries are severely diminutive an atrophic. IMA: There is moderate narrowing at the origin. Branch vessels are patent. Inflow: Mild atherosclerotic calcifications in the right common and external iliac arteries without significant narrowing. Mild disease in the right internal iliac artery. Similarly, there is mild atherosclerotic calcification and some soft plaque in the left common iliac artery. The external iliac artery is pain. Internal iliac artery is patent. Proximal Outflow: The bilateral femoral arteries are grossly patent within the confines of the examination. There is some soft plaque in the right common femoral artery. There is also a small penetrating atherosclerotic ulcer. See table position -1017.3. Veins: Hepatic, portal, splenic, superior mesenteric, and bilateral renal veins are patent. IVC is patent. Bilateral common and external iliac veins are patent. Review of the MIP images confirms the above findings. NON-VASCULAR Lower chest: Dependent atelectasis bilaterally. Hepatobiliary: Subcentimeter hypodensity in the right lobe of the liver on image 20 is nonspecific. There is also a calcified granuloma in the central liver. Gallbladder is decompressed causing artificial wall thickening. Pancreas: Unremarkable Spleen: Unremarkable Adrenals/Urinary Tract: Severe atrophy of the kidneys is present. The appearance is unchanged compare to the prior study. There is a cyst in the medial mid left kidney. There is an enhancing heterogeneous mass in the upper pole of the right kidney which is stable. Bladder  is decompressed. Adrenal glands are within normal limits. Stomach/Bowel: There is a 2.2 x 2.2 cm gas and fluid filled abscess in the left upper quadrant between small bowel loops. No evidence of small-bowel obstruction. Inflammatory changes in the adjacent small bowel are improved. No focal mass of the colon. Diverticulosis throughout the length of the colon is noted. There are postoperative changes in the location of the cecum. There are also postoperative changes at the sigmoid rectum junction. Lymphatic: No abnormal retroperitoneal adenopathy. Reproductive: Uterus is absent.  Adnexa are unremarkable. Other: There is no free fluid in the pelvis. Musculoskeletal: No vertebral compression deformity. Renal osteodystrophy changes in the spine are present. IMPRESSION: VASCULAR Significant narrowing at the origin of the celiac axis. 50% narrowing at the origin of the SMA. Moderate narrowing at the origin of the IMA. NON-VASCULAR There is a 2.2 cm gas and fluid filled abscess within the left upper quadrant between small bowel loops. Stable right upper lobe renal mass. Please see prior dictation for recommendations. Tiny hypodensity in the liver is nonspecific  but likely benign. If patient has a history of malignancy, consider six-month follow-up MRI. Electronically Signed   By: Marybelle Killings M.D.   On: 03/28/2017 12:17       Subjective: Patient feeling well, no nausea or vomiting, no dyspnea or chest pain, no abdominal pain.   Discharge Exam: Vitals:   04/05/17 0820 04/05/17 0830  BP: (!) 140/56 (!) 137/54  Pulse: 68 67  Resp: 18 18  Temp:    SpO2:     Vitals:   04/05/17 0429 04/05/17 0800 04/05/17 0820 04/05/17 0830  BP: (!) 123/42 (!) 140/54 (!) 140/56 (!) 137/54  Pulse: 69 65 68 67  Resp: 16 16 18 18   Temp: 98.2 F (36.8 C) 98.2 F (36.8 C)    TempSrc: Oral Oral    SpO2: 100% 100%    Weight:  58 kg (127 lb 13.9 oz)    Height:        General: Pt is alert, awake, not in acute distress E  ENT.mild pallor no icterus.  Cardiovascular: RRR, S1/S2 +, no rubs, no gallops Respiratory: CTA bilaterally, no wheezing, no rhonchi Abdominal: Soft, NT, ND, bowel sounds + Extremities: no edema, no cyanosis    The results of significant diagnostics from this hospitalization (including imaging, microbiology, ancillary and laboratory) are listed below for reference.     Microbiology: No results found for this or any previous visit (from the past 240 hour(s)).   Labs: BNP (last 3 results)  Recent Labs  07/16/16 1517  BNP 262.0*   Basic Metabolic Panel:  Recent Labs Lab 03/29/17 1519 03/31/17 0753 04/01/17 0228 04/05/17 0258  NA 130* 127* 131* 130*  K 4.2 4.3 3.7 4.0  CL 93* 89* 94* 95*  CO2 20* 25 26 25   GLUCOSE 115* 77 119* 80  BUN 37* 26* 10 38*  CREATININE 7.61* 6.81* 4.46* 8.04*  CALCIUM 7.2* 6.9* 7.5* 8.0*  PHOS 4.3 6.0*  --   --    Liver Function Tests:  Recent Labs Lab 03/29/17 1519 03/31/17 0753  ALBUMIN 2.6* 2.6*   No results for input(s): LIPASE, AMYLASE in the last 168 hours. No results for input(s): AMMONIA in the last 168 hours. CBC:  Recent Labs Lab 03/29/17 1520 03/31/17 0753 03/31/17 1854 04/01/17 0228 04/05/17 0258  WBC 9.0 5.9 5.6 5.5 6.1  NEUTROABS  --   --   --  3.7  --   HGB 9.1* 10.2* 11.9* 11.2* 8.6*  HCT 28.1* 30.9* 37.0 33.8* 25.8*  MCV 99.3 97.8 100.0 100.3* 98.9  PLT 182 217 202 215 218   Cardiac Enzymes:  Recent Labs Lab 03/29/17 1520  TROPONINI 0.16*   BNP: Invalid input(s): POCBNP CBG:  Recent Labs Lab 04/03/17 2152 04/04/17 0754 04/04/17 1227 04/04/17 1716 04/04/17 2218  GLUCAP 117* 84 111* 94 104*   D-Dimer No results for input(s): DDIMER in the last 72 hours. Hgb A1c No results for input(s): HGBA1C in the last 72 hours. Lipid Profile No results for input(s): CHOL, HDL, LDLCALC, TRIG, CHOLHDL, LDLDIRECT in the last 72 hours. Thyroid function studies No results for input(s): TSH, T4TOTAL, T3FREE,  THYROIDAB in the last 72 hours.  Invalid input(s): FREET3 Anemia work up No results for input(s): VITAMINB12, FOLATE, FERRITIN, TIBC, IRON, RETICCTPCT in the last 72 hours. Urinalysis    Component Value Date/Time   COLORURINE YELLOW 12/16/2012 1919   APPEARANCEUR CLOUDY (A) 12/16/2012 1919   LABSPEC 1.009 12/16/2012 1919   PHURINE 7.5 12/16/2012 1919   GLUCOSEU NEGATIVE  12/16/2012 1919   HGBUR TRACE (A) 12/16/2012 1919   BILIRUBINUR NEGATIVE 12/16/2012 1919   KETONESUR NEGATIVE 12/16/2012 1919   PROTEINUR 100 (A) 12/16/2012 1919   UROBILINOGEN 0.2 12/16/2012 1919   NITRITE NEGATIVE 12/16/2012 1919   LEUKOCYTESUR SMALL (A) 12/16/2012 1919   Sepsis Labs Invalid input(s): PROCALCITONIN,  WBC,  LACTICIDVEN Microbiology No results found for this or any previous visit (from the past 240 hour(s)).   Time coordinating discharge: 45 minutes  SIGNED:   Tawni Millers, MD  [facility] 04/05/2017, 10:17 AM Pager [phone]  If 7PM-7AM, please contact night-coverage [email or web address] Password [email or web address]

## 2017-04-06 ENCOUNTER — Ambulatory Visit: Payer: Medicare HMO | Admitting: Cardiovascular Disease

## 2017-04-06 DIAGNOSIS — N186 End stage renal disease: Secondary | ICD-10-CM | POA: Diagnosis not present

## 2017-04-06 DIAGNOSIS — K55019 Acute (reversible) ischemia of small intestine, extent unspecified: Secondary | ICD-10-CM | POA: Diagnosis not present

## 2017-04-06 DIAGNOSIS — K5700 Diverticulitis of small intestine with perforation and abscess without bleeding: Secondary | ICD-10-CM | POA: Diagnosis not present

## 2017-04-06 DIAGNOSIS — I10 Essential (primary) hypertension: Secondary | ICD-10-CM | POA: Diagnosis not present

## 2017-04-07 DIAGNOSIS — I25118 Atherosclerotic heart disease of native coronary artery with other forms of angina pectoris: Secondary | ICD-10-CM | POA: Diagnosis not present

## 2017-04-07 DIAGNOSIS — K5700 Diverticulitis of small intestine with perforation and abscess without bleeding: Secondary | ICD-10-CM | POA: Diagnosis not present

## 2017-04-07 DIAGNOSIS — Z992 Dependence on renal dialysis: Secondary | ICD-10-CM | POA: Diagnosis not present

## 2017-04-07 DIAGNOSIS — I10 Essential (primary) hypertension: Secondary | ICD-10-CM | POA: Diagnosis not present

## 2017-04-07 DIAGNOSIS — Z23 Encounter for immunization: Secondary | ICD-10-CM | POA: Diagnosis not present

## 2017-04-07 DIAGNOSIS — N186 End stage renal disease: Secondary | ICD-10-CM | POA: Diagnosis not present

## 2017-04-07 LAB — TROPONIN I: TROPONIN I: 0.16 ng/mL — AB (ref <0.03)

## 2017-04-09 DIAGNOSIS — Z992 Dependence on renal dialysis: Secondary | ICD-10-CM | POA: Diagnosis not present

## 2017-04-09 DIAGNOSIS — N186 End stage renal disease: Secondary | ICD-10-CM | POA: Diagnosis not present

## 2017-04-10 DIAGNOSIS — N186 End stage renal disease: Secondary | ICD-10-CM | POA: Diagnosis not present

## 2017-04-10 DIAGNOSIS — Z992 Dependence on renal dialysis: Secondary | ICD-10-CM | POA: Diagnosis not present

## 2017-04-11 ENCOUNTER — Encounter (INDEPENDENT_AMBULATORY_CARE_PROVIDER_SITE_OTHER): Payer: Self-pay

## 2017-04-11 ENCOUNTER — Encounter (INDEPENDENT_AMBULATORY_CARE_PROVIDER_SITE_OTHER): Payer: Medicare HMO

## 2017-04-11 ENCOUNTER — Ambulatory Visit (INDEPENDENT_AMBULATORY_CARE_PROVIDER_SITE_OTHER): Payer: Medicare HMO | Admitting: Vascular Surgery

## 2017-04-11 DIAGNOSIS — I10 Essential (primary) hypertension: Secondary | ICD-10-CM | POA: Diagnosis not present

## 2017-04-11 DIAGNOSIS — K5700 Diverticulitis of small intestine with perforation and abscess without bleeding: Secondary | ICD-10-CM | POA: Diagnosis not present

## 2017-04-11 DIAGNOSIS — N186 End stage renal disease: Secondary | ICD-10-CM | POA: Diagnosis not present

## 2017-04-11 DIAGNOSIS — K55019 Acute (reversible) ischemia of small intestine, extent unspecified: Secondary | ICD-10-CM | POA: Diagnosis not present

## 2017-04-13 ENCOUNTER — Other Ambulatory Visit (INDEPENDENT_AMBULATORY_CARE_PROVIDER_SITE_OTHER): Payer: Self-pay | Admitting: Vascular Surgery

## 2017-04-15 DIAGNOSIS — N186 End stage renal disease: Secondary | ICD-10-CM | POA: Diagnosis not present

## 2017-04-15 DIAGNOSIS — K5732 Diverticulitis of large intestine without perforation or abscess without bleeding: Secondary | ICD-10-CM | POA: Diagnosis not present

## 2017-04-15 DIAGNOSIS — E785 Hyperlipidemia, unspecified: Secondary | ICD-10-CM | POA: Diagnosis not present

## 2017-04-15 DIAGNOSIS — Z992 Dependence on renal dialysis: Secondary | ICD-10-CM | POA: Diagnosis not present

## 2017-04-15 DIAGNOSIS — Z7982 Long term (current) use of aspirin: Secondary | ICD-10-CM | POA: Diagnosis not present

## 2017-04-15 DIAGNOSIS — I480 Paroxysmal atrial fibrillation: Secondary | ICD-10-CM | POA: Diagnosis not present

## 2017-04-15 DIAGNOSIS — I120 Hypertensive chronic kidney disease with stage 5 chronic kidney disease or end stage renal disease: Secondary | ICD-10-CM | POA: Diagnosis not present

## 2017-04-15 DIAGNOSIS — I2510 Atherosclerotic heart disease of native coronary artery without angina pectoris: Secondary | ICD-10-CM | POA: Diagnosis not present

## 2017-04-18 ENCOUNTER — Encounter
Admission: RE | Disposition: A | Discharge status: Home / Self Care | Payer: Self-pay | Source: Ambulatory Visit | Attending: Vascular Surgery

## 2017-04-18 ENCOUNTER — Ambulatory Visit
Admission: RE | Admit: 2017-04-18 | Discharge: 2017-04-18 | Disposition: A | Discharge status: Home / Self Care | Payer: Medicare HMO | Source: Ambulatory Visit | Attending: Vascular Surgery | Admitting: Vascular Surgery

## 2017-04-18 DIAGNOSIS — Z955 Presence of coronary angioplasty implant and graft: Secondary | ICD-10-CM | POA: Insufficient documentation

## 2017-04-18 DIAGNOSIS — Z8543 Personal history of malignant neoplasm of ovary: Secondary | ICD-10-CM | POA: Diagnosis not present

## 2017-04-18 DIAGNOSIS — Z885 Allergy status to narcotic agent status: Secondary | ICD-10-CM | POA: Insufficient documentation

## 2017-04-18 DIAGNOSIS — I5032 Chronic diastolic (congestive) heart failure: Secondary | ICD-10-CM | POA: Insufficient documentation

## 2017-04-18 DIAGNOSIS — E7800 Pure hypercholesterolemia, unspecified: Secondary | ICD-10-CM | POA: Diagnosis not present

## 2017-04-18 DIAGNOSIS — Z992 Dependence on renal dialysis: Secondary | ICD-10-CM | POA: Insufficient documentation

## 2017-04-18 DIAGNOSIS — M109 Gout, unspecified: Secondary | ICD-10-CM | POA: Insufficient documentation

## 2017-04-18 DIAGNOSIS — Z9889 Other specified postprocedural states: Secondary | ICD-10-CM | POA: Insufficient documentation

## 2017-04-18 DIAGNOSIS — Z8673 Personal history of transient ischemic attack (TIA), and cerebral infarction without residual deficits: Secondary | ICD-10-CM | POA: Diagnosis not present

## 2017-04-18 DIAGNOSIS — Z8711 Personal history of peptic ulcer disease: Secondary | ICD-10-CM | POA: Diagnosis not present

## 2017-04-18 DIAGNOSIS — I132 Hypertensive heart and chronic kidney disease with heart failure and with stage 5 chronic kidney disease, or end stage renal disease: Secondary | ICD-10-CM | POA: Insufficient documentation

## 2017-04-18 DIAGNOSIS — Z886 Allergy status to analgesic agent status: Secondary | ICD-10-CM | POA: Diagnosis not present

## 2017-04-18 DIAGNOSIS — N186 End stage renal disease: Secondary | ICD-10-CM | POA: Diagnosis not present

## 2017-04-18 DIAGNOSIS — Z90710 Acquired absence of both cervix and uterus: Secondary | ICD-10-CM | POA: Diagnosis not present

## 2017-04-18 DIAGNOSIS — Z91041 Radiographic dye allergy status: Secondary | ICD-10-CM | POA: Insufficient documentation

## 2017-04-18 DIAGNOSIS — E1122 Type 2 diabetes mellitus with diabetic chronic kidney disease: Secondary | ICD-10-CM | POA: Insufficient documentation

## 2017-04-18 DIAGNOSIS — Z85038 Personal history of other malignant neoplasm of large intestine: Secondary | ICD-10-CM | POA: Diagnosis not present

## 2017-04-18 DIAGNOSIS — Z882 Allergy status to sulfonamides status: Secondary | ICD-10-CM | POA: Diagnosis not present

## 2017-04-18 DIAGNOSIS — I051 Rheumatic mitral insufficiency: Secondary | ICD-10-CM | POA: Insufficient documentation

## 2017-04-18 DIAGNOSIS — Z452 Encounter for adjustment and management of vascular access device: Secondary | ICD-10-CM | POA: Insufficient documentation

## 2017-04-18 DIAGNOSIS — Z951 Presence of aortocoronary bypass graft: Secondary | ICD-10-CM | POA: Diagnosis not present

## 2017-04-18 DIAGNOSIS — I2510 Atherosclerotic heart disease of native coronary artery without angina pectoris: Secondary | ICD-10-CM | POA: Insufficient documentation

## 2017-04-18 DIAGNOSIS — Z880 Allergy status to penicillin: Secondary | ICD-10-CM | POA: Diagnosis not present

## 2017-04-18 DIAGNOSIS — Z888 Allergy status to other drugs, medicaments and biological substances status: Secondary | ICD-10-CM | POA: Diagnosis not present

## 2017-04-18 DIAGNOSIS — I252 Old myocardial infarction: Secondary | ICD-10-CM | POA: Diagnosis not present

## 2017-04-18 DIAGNOSIS — Z83438 Family history of other disorder of lipoprotein metabolism and other lipidemia: Secondary | ICD-10-CM | POA: Insufficient documentation

## 2017-04-18 DIAGNOSIS — Z833 Family history of diabetes mellitus: Secondary | ICD-10-CM | POA: Insufficient documentation

## 2017-04-18 HISTORY — PX: DIALYSIS/PERMA CATHETER REMOVAL: CATH118289

## 2017-04-18 SURGERY — DIALYSIS/PERMA CATHETER REMOVAL
Anesthesia: LOCAL

## 2017-04-18 MED ORDER — LIDOCAINE-EPINEPHRINE (PF) 1 %-1:200000 IJ SOLN
INTRAMUSCULAR | Status: DC | PRN
  Administered 2017-04-18: 10 mL

## 2017-04-18 MED ORDER — LIDOCAINE-EPINEPHRINE (PF) 1 %-1:200000 IJ SOLN
INTRAMUSCULAR | Status: AC
  Filled 2017-04-18: qty 30

## 2017-04-18 SURGICAL SUPPLY — 1 items: TRAY LACERAT/PLASTIC (MISCELLANEOUS) ×2 IMPLANT

## 2017-04-18 NOTE — Discharge Instructions (Signed)
Tunneled Catheter Removal, Care After °Refer to this sheet in the next few weeks. These instructions provide you with information about caring for yourself after your procedure. Your health care provider may also give you more specific instructions. Your treatment has been planned according to current medical practices, but problems sometimes occur. Call your health care provider if you have any problems or questions after your procedure. °What can I expect after the procedure? °After the procedure, it is common to have: °· Some mild redness, swelling, and pain around your catheter site. ° ° °Follow these instructions at home: °Incision care  °· Check your removal site  every day for signs of infection. Check for: °¨ More redness, swelling, or pain. °¨ More fluid or blood. °¨ Warmth. °¨ Pus or a bad smell. °· Follow instructions from your health care provider about how to take care of your removal site. Make sure you: °¨ Wash your hands with soap and water before you change your bandages (dressings). If soap and water are not available, use hand sanitizer. °Activity  °· Return to your normal activities as told by your health care provider. Ask your health care provider what activities are safe for you. °· Do not lift anything that is heavier than 10 lb (4.5 kg) for 3 weeks or as long as told by your health care provider. ° °Contact a health care provider if: °· You have more fluid or blood coming from your removal site °· You have more redness, swelling, or pain at your incisions or around the area where your catheter was removed °· Your removal site feel warm to the touch. °· You feel unusually weak. °· You feel nauseous.. °· Get help right away if °· You have swelling in your arm, shoulder, neck, or face. °· You develop chest pain. °· You have difficulty breathing. °· You feel dizzy or light-headed. °· You have pus or a bad smell coming from your removal site °· You have a fever. °· You develop bleeding from your  removal site, and your bleeding does not stop. °This information is not intended to replace advice given to you by your health care provider. Make sure you discuss any questions you have with your health care provider. °Document Released: 06/13/2012 Document Revised: 02/28/2016 Document Reviewed: 03/23/2015 °Elsevier Interactive Patient Education © 2017 [employer] ° °

## 2017-04-18 NOTE — Op Note (Signed)
  OPERATIVE NOTE   PROCEDURE: 1. Removal of a right IJ tunneled dialysis catheter  PRE-OPERATIVE DIAGNOSIS: Complication of dialysis catheter, End stage renal disease  POST-OPERATIVE DIAGNOSIS: Same  SURGEON: Hortencia Pilar, M.D.  ANESTHESIA: Local anesthetic with 1% lidocaine with epinephrine   ESTIMATED BLOOD LOSS: Minimal   FINDING(S): 1. Catheter intact   SPECIMEN(S):  Catheter  INDICATIONS:   Shawna [name] is a 76 y.o. female who presents with functioning left arm access.  The patient has undergone placement of an extremity access which is working and this has been successfully cannulated without difficulty.  therefore is undergoing removal of his tunneled catheter which is no longer needed to avoid septic complications.   DESCRIPTION: After obtaining full informed written consent, the patient was positioned supine. The right IJ catheter and surrounding area is prepped and draped in a sterile fashion. The cuff was localized by palpation and noted to be less than 3 cm from the exit site. After appropriate timeout is called, 1% lidocaine with epinephrine is infiltrated into the surrounding tissues around the cuff. Small transverse incision is created at the exit site with an 11 blade scalpel and the dissection was carried up along the catheter to expose the cuff of the tunneled catheter.  The catheter cuff is then freed from the surrounding attachments and adhesions. Once the catheter has been freed circumferentially it is removed in 1 piece. Light pressure was held at the base of the neck.   Antibiotic ointment and a sterile dressing is applied to the exit site. Patient tolerated procedure well and there were no complications.  COMPLICATIONS: None  CONDITION: Unchanged  Hortencia Pilar, M.D. [facility] Office: [phone]  04/18/2017,3:02 PM

## 2017-04-18 NOTE — H&P (Signed)
[facility] Admission History & Physical  MRN : [id number]  Shawna [name] is a 76 y.o. (03-15-1941) female who presents with chief complaint of No chief complaint on file. Marland Kitchen  History of Present Illness: I am asked to evaluate the patient by the dialysis center. The patient was sent here because they have a nonfunctioning tunneled catheter and a functioning left arm brachial axillary AV graft.  The patient reports they're not been any problems with any of their dialysis runs. They are reporting good flows with good parameters at dialysis.  Patient denies pain or tenderness overlying the access.  There is no pain with dialysis.  The patient denies hand pain or finger pain consistent with steal syndrome.  No fevers or chills while on dialysis.   No current facility-administered medications for this encounter.     Past Medical History:  Diagnosis Date  . Acute on chronic respiratory failure with hypoxia ([place]) 10/10/2016  . Anxiety   . Arthritis   . AVM (arteriovenous malformation) of colon   . CAD (coronary artery disease)    a. 12/2011 NSTEMI/Cath/PCI LCX (2.25x14 Resolute DES) & D1 (2.25x22 Resolute DES);  b. 01/2012 Cath/PCI: LM 30, LAD 30p, 40-29m, D1 stent ok, 99 in sm branch of diag, LCX patent stent, OM1 20, RCA 95 ost (4.0x12 Promus DES), EF 55%;  c. 04/2012 Lexi Cardiolite  EF 48%, small area of scar @ base/mid inflat wall with mild peri-infarct ischemia.; CABG 12/4  . Carotid artery disease ([place])    a. 46-96% LICA, 08/9526   . Chronic bronchitis ([place])   . Chronic diastolic CHF (congestive heart failure) ([place])    a. 02/2012 Echo EF 60-65%, nl wall motion, Gr 1 DD, mod MR  . Colon cancer ([place]) 1992  . Esophageal stricture   . ESRD on hemodialysis ([place])    ESRD due to HTN, started dialysis 2011 and gets HD at [facility] with Dr Hinda Lenis on MWF schedule.  Access is LUA AVF as of Sept 2014.   Marland Kitchen GERD (gastroesophageal reflux disease)   . High cholesterol  12/2011  . History of blood transfusion 07/2011; 12/2011; 01/2012 X 2; 04/2012  . History of gout   . History of lower GI bleeding   . Hypertension   . Iron deficiency anemia   . Mitral regurgitation    a. Moderate by echo, 02/2012  . Myocardial infarction ([place])   . Ovarian cancer ([place]) 1992  . Pneumonia ~ 2009  . PUD (peptic ulcer disease)   . TIA (transient ischemic attack)     Past Surgical History:  Procedure Laterality Date  . ABDOMINAL HYSTERECTOMY  1992  . APPENDECTOMY  06/1990  . AV FISTULA PLACEMENT  07/2009   left upper arm  . AV FISTULA PLACEMENT Right 09/06/2016   Procedure: RIGHT FOREARM ARTERIOVENOUS (AV) GRAFT;  Surgeon: Elam Dutch, MD;  [facility];  Service: Vascular;  Laterality: Right;  . AV FISTULA PLACEMENT N/A 02/24/2017   Procedure: INSERTION OF ARTERIOVENOUS (AV) GORE-TEX GRAFT ARM (BRACHIAL AXILLARY);  Surgeon: Katha Cabal, MD;  Location: [facility] ORS;  Service: Vascular;  Laterality: N/A;  . [place] Right 09/06/2016   Procedure: REMOVAL OF Right Arm ARTERIOVENOUS GORETEX GRAFT and Vein Patch angioplasty of brachial artery;  Surgeon: Angelia Mould, MD;  Location: [place];  Service: Vascular;  Laterality: Right;  . COLON RESECTION  1992  . COLON SURGERY    . CORONARY ANGIOPLASTY WITH STENT PLACEMENT  12/15/11   "  2"  . CORONARY ANGIOPLASTY WITH STENT PLACEMENT  y/2013   "1; makes total of 3" (05/02/2012)  . CORONARY ARTERY BYPASS GRAFT  06/13/2012   Procedure: CORONARY ARTERY BYPASS GRAFTING (CABG);  Surgeon: Grace Isaac, MD;  Location: [place];  Service: Open Heart Surgery;  Laterality: N/A;  cabg x four;  using left internal mammary artery, and left leg greater saphenous vein harvested endoscopically  . CORONARY STENT INTERVENTION N/A 10/13/2016   Procedure: Coronary Stent Intervention;  Surgeon: Troy Sine, MD;  Location: [place] [facility];  Service: Cardiovascular;  Laterality: N/A;  . DILATION AND CURETTAGE OF UTERUS    .  ESOPHAGOGASTRODUODENOSCOPY  01/20/2012   Procedure: ESOPHAGOGASTRODUODENOSCOPY (EGD);  Surgeon: Ladene Artist, MD,FACG;  Location: [facility] ENDOSCOPY;  Service: Endoscopy;  Laterality: N/A;  . ESOPHAGOGASTRODUODENOSCOPY N/A 03/26/2013   Procedure: ESOPHAGOGASTRODUODENOSCOPY (EGD);  Surgeon: Irene Shipper, MD;  Location: [facility] ENDOSCOPY;  Service: Endoscopy;  Laterality: N/A;  . ESOPHAGOGASTRODUODENOSCOPY N/A 04/30/2015   Procedure: ESOPHAGOGASTRODUODENOSCOPY (EGD);  Surgeon: Rogene [place], MD;  [facility];  Service: Endoscopy;  Laterality: N/A;  1pm - moved to 10/20 @ 1:10  . ESOPHAGOGASTRODUODENOSCOPY N/A 07/29/2016   Procedure: ESOPHAGOGASTRODUODENOSCOPY (EGD);  Surgeon: Manus Gunning, MD;  Location: [place];  Service: Gastroenterology;  Laterality: N/A;  enteroscopy  . INTRAOPERATIVE TRANSESOPHAGEAL ECHOCARDIOGRAM  06/13/2012   Procedure: INTRAOPERATIVE TRANSESOPHAGEAL ECHOCARDIOGRAM;  Surgeon: Grace Isaac, MD;  Location: [place];  Service: Open Heart Surgery;  Laterality: N/A;  . IR GENERIC HISTORICAL  07/26/2016   IR FLUORO GUIDE CV LINE RIGHT 07/26/2016 Greggory Keen, MD [facility]  . IR GENERIC HISTORICAL  07/26/2016   IR US GUIDE VASC ACCESS RIGHT 07/26/2016 Greggory Keen, MD [facility]  . IR GENERIC HISTORICAL  08/02/2016   IR US GUIDE VASC ACCESS RIGHT 08/02/2016 Greggory Keen, MD [facility]  . IR GENERIC HISTORICAL  08/02/2016   IR FLUORO GUIDE CV LINE RIGHT 08/02/2016 Greggory Keen, MD [facility]  . IR RADIOLOGY PERIPHERAL GUIDED IV START  03/28/2017  . IR US GUIDE VASC ACCESS RIGHT  03/28/2017  . LEFT HEART CATH AND CORONARY ANGIOGRAPHY N/A 09/20/2016   Procedure: Left Heart Cath and Coronary Angiography;  Surgeon: [name], MD;  Location: [place] [facility];  Service: Cardiovascular;  Laterality: N/A;  . LEFT HEART CATH AND CORS/GRAFTS ANGIOGRAPHY N/A 10/13/2016   Procedure: Left Heart Cath and Cors/Grafts Angiography;  Surgeon: Troy Sine, MD;   Location: [place] [facility];  Service: Cardiovascular;  Laterality: N/A;  . LEFT HEART CATHETERIZATION WITH CORONARY ANGIOGRAM N/A 12/15/2011   Procedure: LEFT HEART CATHETERIZATION WITH CORONARY ANGIOGRAM;  Surgeon: Burnell Blanks, MD;  Location: [facility] CATH LAB;  Service: Cardiovascular;  Laterality: N/A;  . LEFT HEART CATHETERIZATION WITH CORONARY ANGIOGRAM N/A 01/10/2012   Procedure: LEFT HEART CATHETERIZATION WITH CORONARY ANGIOGRAM;  Surgeon: Peter M Martinique, MD;  [facility] LAB;  Service: Cardiovascular;  Laterality: N/A;  . LEFT HEART CATHETERIZATION WITH CORONARY ANGIOGRAM N/A 06/08/2012   Procedure: LEFT HEART CATHETERIZATION WITH CORONARY ANGIOGRAM;  Surgeon: Burnell Blanks, MD;  Location: [facility] CATH LAB;  Service: Cardiovascular;  Laterality: N/A;  . LEFT HEART CATHETERIZATION WITH CORONARY/GRAFT ANGIOGRAM N/A 12/10/2013   Procedure: LEFT HEART CATHETERIZATION WITH Beatrix Fetters;  Surgeon: [name], MD;  Location: [facility] CATH LAB;  Service: Cardiovascular;  Laterality: N/A;  . OVARY SURGERY     ovarian cancer  . REVISION OF ARTERIOVENOUS GORETEX GRAFT N/A 02/24/2017  Procedure: REVISION OF ARTERIOVENOUS GORETEX GRAFT (RESECTION);  Surgeon: Katha Cabal, MD;  Location: [facility] ORS;  Service: Vascular;  Laterality: N/A;  Earney Mallet N/A 10/15/2013   Procedure: Fistulogram;  Surgeon: Serafina Mitchell, MD;  Location: [facility] CATH LAB;  Service: Cardiovascular;  Laterality: N/A;  . THROMBECTOMY / ARTERIOVENOUS GRAFT REVISION  2011   left upper arm  . TUBAL LIGATION  1980's  . UPPER EXTREMITY ANGIOGRAPHY Bilateral 12/06/2016   Procedure: Upper Extremity Angiography;  Surgeon: Katha Cabal, MD;  Location: [place] [facility];  Service: Cardiovascular;  Laterality: Bilateral;    Social History Social History  Substance Use Topics  . Smoking status: Never Smoker  . Smokeless tobacco: Never Used  . Alcohol use No    Family History Family History   Problem Relation Age of Onset  . Heart disease Mother        Heart Disease before age 33  . Hyperlipidemia Mother   . Hypertension Mother   . Diabetes Mother   . Heart attack Mother   . Heart disease Father        Heart Disease before age 77  . Hyperlipidemia Father   . Hypertension Father   . Diabetes Father   . Diabetes Sister   . Hypertension Sister   . Diabetes Brother   . Hyperlipidemia Brother   . Heart attack Brother   . Hypertension Sister   . Heart attack Brother   . Other Unknown        noncontributory for early CAD  . Colon cancer Neg Hx   . Esophageal cancer Neg Hx   . Liver disease Neg Hx   . Kidney disease Neg Hx   . Colon polyps Neg Hx     No family history of bleeding or clotting disorders, autoimmune disease or porphyria  Allergies  Allergen Reactions  . Aspirin Other (See Comments)    High Doses Mess up her stomach; "makes my bowels have blood in them". Takes 81 mg EC Aspirin   . Penicillins Other (See Comments)    SYNCOPE? , "makes me real weak when I take it; like I'll pass out"  Has patient had a PCN reaction causing immediate rash, facial/tongue/throat swelling, SOB or lightheadedness with hypotension: Yes Has patient had a PCN reaction causing severe rash involving mucus membranes or skin necrosis: no Has patient had a PCN reaction that required hospitalization no Has patient had a PCN reaction occurring within the last 10 years: no If all of the above  . Amlodipine Swelling  . Bactrim [Sulfamethoxazole-Trimethoprim] Rash  . Contrast Media [Iodinated Diagnostic Agents] Itching  . Iron Itching and Other (See Comments)    "they gave me iron in dialysis; had to give me Benadryl cause I had to have the iron" (05/02/2012)  . Nitrofurantoin Hives  . Tylenol [Acetaminophen] Itching and Other (See Comments)    Makes her feet on fire per pt  . Gabapentin Other (See Comments)    unspecified  . Dexilant [Dexlansoprazole] Other (See Comments)    Upset  stomach  . Levaquin [Levofloxacin In D5w] Rash  . Morphine And Related Itching    Itching in feet  . Plavix [Clopidogrel Bisulfate] Rash  . Protonix [Pantoprazole Sodium] Rash  . Venofer [Ferric Oxide] Itching    Patient reports using Benadryl prior to doses as [place] (Negative unless checked)  Constitutional: [] Weight loss  [] Fever  [] Chills Cardiac: [] Chest pain   [] Chest  pressure   [] Palpitations   [] Shortness of breath when laying flat   [] Shortness of breath at rest   [x] Shortness of breath with exertion. Vascular:  [] Pain in legs with walking   [] Pain in legs at rest   [] Pain in legs when laying flat   [] Claudication   [] Pain in feet when walking  [] Pain in feet at rest  [] Pain in feet when laying flat   [] History of DVT   [] Phlebitis   [] Swelling in legs   [] Varicose veins   [] Non-healing ulcers Pulmonary:   [] Uses home oxygen   [] Productive cough   [] Hemoptysis   [] Wheeze  [] COPD   [] Asthma Neurologic:  [] Dizziness  [] Blackouts   [] Seizures   [] History of stroke   [] History of TIA  [] Aphasia   [] Temporary blindness   [] Dysphagia   [] Weakness or numbness in arms   [] Weakness or numbness in legs Musculoskeletal:  [] Arthritis   [] Joint swelling   [] Joint pain   [] Low back pain Hematologic:  [] Easy bruising  [] Easy bleeding   [] Hypercoagulable state   [] Anemic  [] Hepatitis Gastrointestinal:  [] Blood in stool   [] Vomiting blood  [] Gastroesophageal reflux/heartburn   [] Difficulty swallowing. Genitourinary:  [x] Chronic kidney disease   [] Difficult urination  [] Frequent urination  [] Burning with urination   [] Blood in urine Skin:  [] Rashes   [] Ulcers   [] Wounds Psychological:  [] History of anxiety   []  History of major depression.  Physical Examination  Vitals:   04/18/17 1303  BP: (!) 181/63  Pulse: 77  Resp: 15  Temp: 98 F (36.7 C)  TempSrc: Oral  SpO2: 100%  Weight: 57.2 kg (126 lb)  Height: 5\' 1"  (1.549 m)   Body mass index is 23.81  kg/m. Gen: WD/WN, NAD Head: [place]/AT, No temporalis wasting. Prominent temp pulse not noted. Ear/Nose/Throat: Hearing grossly intact, nares w/o erythema or drainage, oropharynx w/o Erythema/Exudate,  Eyes: Conjunctiva clear, sclera non-icteric Neck: Trachea midline.  No JVD.  Pulmonary:  Good air movement, respirations not labored, no use of accessory muscles.  Cardiac: RRR, normal S1, S2. Vascular: Left arm brachial axillary AV graft with good thrill good bruit Vessel Right Left  Radial Palpable Palpable  Ulnar Not Palpable Not Palpable  Brachial Palpable Palpable  Carotid Palpable, without bruit Palpable, without bruit  Gastrointestinal: soft, non-tender/non-distended. No guarding/reflex.  Musculoskeletal: M/S 5/5 throughout.  Extremities without ischemic changes.  No deformity or atrophy.  Neurologic: Sensation grossly intact in extremities.  Symmetrical.  Speech is fluent. Motor exam as listed above. Psychiatric: Judgment intact, Mood & affect appropriate for pt's clinical situation. Dermatologic: No rashes or ulcers noted.  No cellulitis or open wounds. Lymph : No Cervical, Axillary, or Inguinal lymphadenopathy.   CBC Lab Results  Component Value Date   WBC 6.1 04/05/2017   HGB 8.6 (L) 04/05/2017   HCT 25.8 (L) 04/05/2017   MCV 98.9 04/05/2017   PLT 218 04/05/2017    BMET    Component Value Date/Time   NA 130 (L) 04/05/2017 0258   K 4.0 04/05/2017 0258   CL 95 (L) 04/05/2017 0258   CO2 25 04/05/2017 0258   GLUCOSE 80 04/05/2017 0258   BUN 38 (H) 04/05/2017 0258   CREATININE 8.04 (H) 04/05/2017 0258   CALCIUM 8.0 (L) 04/05/2017 0258   CALCIUM 8.1 (L) 05/29/2013 1814   GFRNONAA 4 (L) 04/05/2017 0258   GFRAA 5 (L) 04/05/2017 0258   Estimated Creatinine Clearance: 4.4 mL/min (A) (by C-G formula based on SCr of 8.04 mg/dL (H)).  COAG  Lab Results  Component Value Date   INR 0.98 03/28/2017   INR 1.01 02/08/2017   INR 1.05 01/24/2017    Radiology Ct Abdomen  Pelvis Wo Contrast  Result Date: 03/22/2017 CLINICAL DATA:  76 y/o F; generalized abdominal pain and abdominal distention. EXAM: CT ABDOMEN AND PELVIS WITHOUT CONTRAST TECHNIQUE: Multidetector CT imaging of the abdomen and pelvis was performed following the standard protocol without IV contrast. COMPARISON:  11/18/2016 CT of abdomen and pelvis. FINDINGS: Lower chest: Mild cardiomegaly. Severe coronary artery calcification. Left lower lobe platelike atelectasis. Hepatobiliary: No focal liver abnormality. Cholelithiasis. No intra or extrahepatic biliary ductal dilatation. Pancreas: Unremarkable. No pancreatic ductal dilatation or surrounding inflammatory changes. Spleen: Normal in size without focal abnormality. Adrenals/Urinary Tract: Normal adrenal glands. Right upper kidney mass measuring 2.9 cm and left kidney interpolar mass measuring 1.6 cm are stable. Severe atrophy of kidneys. No hydronephrosis. Bladder is collapsed. Stomach/Bowel: Patent rectosigmoid and cecal region anastomosis. Pancolonic diverticulosis. The short segment of small bowel inflammatory changes with edema extending into the mesentery centered around a small focus of air extending beyond the wall of the small bowel (series 5, image 29) which may represent a contained perforation or inflamed small bowel diverticulum. No bowel obstruction. Vascular/Lymphatic: Aortic atherosclerosis with severe calcific. No enlarged abdominal or pelvic lymph nodes. Reproductive: Status post hysterectomy. No adnexal masses. Other: Small volume of ascites within the left hemiabdomen. Musculoskeletal: No fracture is seen. IMPRESSION: 1. Short segment of inflamed small bowel surrounding a small focus of air extending beyond the bowel wall which may represent a contained perforation or small bowel diverticulitis. Trace ascites is likely reactive. 2. Mild cardiomegaly and severe coronary artery calcification. 3. Cholelithiasis. 4. Stable right kidney upper pole 2.9 cm  indeterminate mass and left kidney interpolar 1.6 cm indeterminate mass. Renal protocol CT with and without contrast is recommended to further characterize. This recommendation follows ACR consensus guidelines: Management of the Incidental Renal Mass on CT: A White Paper of the ACR Incidental Findings Committee. J Am Coll Radiol [phone]. 5. Pan colonic diverticulosis. Electronically Signed   By: Kristine Garbe M.D.   On: 03/22/2017 06:47   Dg Chest 2 View  Result Date: 03/29/2017 CLINICAL DATA:  Shortness of breath with cough. EXAM: CHEST  2 VIEW COMPARISON:  03/22/2017. FINDINGS: The heart is enlarged. There is coronary artery calcification and thoracic atherosclerosis. There is no consolidation or edema. Dual lumen catheter unchanged, tips proximal RIGHT atrium. IMPRESSION: Stable chest. Electronically Signed   By: Staci Righter M.D.   On: 03/29/2017 09:33   US Venous Img Upper Uni Left  Result Date: 03/23/2017 CLINICAL DATA:  Left hand pain since yesterday. Left hand turning cold for 2 weeks. Patient has a Gore-Tex graft in the lower arm and antecubital fossa region. EXAM: LEFT UPPER EXTREMITY VENOUS DOPPLER ULTRASOUND TECHNIQUE: Gray-scale sonography with graded compression, as well as color Doppler and duplex ultrasound were performed to evaluate the upper extremity deep venous system from the level of the subclavian vein and including the jugular, axillary, basilic, radial, ulnar and upper cephalic vein. Spectral Doppler was utilized to evaluate flow at rest and with distal augmentation maneuvers. COMPARISON:  None. FINDINGS: Contralateral Subclavian Vein: Respiratory phasicity is normal and symmetric with the symptomatic side. No evidence of thrombus. Normal compressibility. Internal Jugular Vein: No evidence of thrombus. Normal compressibility, respiratory phasicity and response to augmentation. Subclavian Vein: No evidence of thrombus. Normal compressibility, respiratory phasicity  and response to augmentation. Axillary Vein: No evidence of thrombus. Normal  compressibility, respiratory phasicity and response to augmentation. Cephalic Vein: Irregular hypoechoic to mildly hyperechoic area in the left forearm near the patient's dialysis graft that may reflect a chronically thrombosed cephalic vein. Basilic Vein: No evidence of thrombus. Normal compressibility, respiratory phasicity and response to augmentation. Brachial Veins: No evidence of thrombus. Normal compressibility, respiratory phasicity and response to augmentation. Radial Veins: No evidence of thrombus. Normal compressibility, respiratory phasicity and response to augmentation. Ulnar Veins: No evidence of thrombus. Normal compressibility, respiratory phasicity and response to augmentation. Venous Reflux:  None visualized. Other Findings:  The dialysis fistula is widely patent. IMPRESSION: 1. Probable chronic thrombosis in the cephalic vein in the patient's dialysis fistula in the left arm. 2. Remaining veins are widely patent. Dialysis fistula is widely patent. Electronically Signed   By: Lajean Manes M.D.   On: 03/23/2017 15:55   Ir US Guide Vasc Access Right  Result Date: 03/28/2017 INDICATION: 76 year old with abdominal pain and being evaluated for mesenteric ischemia. Patient needs an abdominal CTA but does not have adequate venous access for the CTA examination. Patient is end-stage renal disease and not a candidate for PICC line. Patient has a left upper arm fistula which is maturing and a right jugular dialysis catheter. EXAM: PLACEMENT OF PERIPHERAL IV WITH ULTRASOUND GUIDANCE MEDICATIONS: None ANESTHESIA/SEDATION: None FLUOROSCOPY TIME:  None). COMPLICATIONS: None immediate. PROCEDURE: Right upper arm was evaluated with ultrasound. A patent basilic vein was identified. Right upper arm was prepped and draped in a sterile fashion. Tourniquet was placed. Skin was anesthetized with 1% lidocaine. 21 gauge needle directed into  the right basilic vein with ultrasound guidance. Micropuncture dilator set was placed. Catheter aspirated and flushed well. IMPRESSION: Successful placement of a peripheral IV in the right upper arm. This is intended to be a temporary access that will be removed after the CTA procedure. Electronically Signed   By: Markus Daft M.D.   On: 03/28/2017 13:35   Dg Abdomen Acute W/chest  Result Date: 03/22/2017 CLINICAL DATA:  Diffuse abdominal pain. EXAM: DG ABDOMEN ACUTE W/ 1V CHEST COMPARISON:  Radiograph 02/08/2017.  CT 11/18/2016 FINDINGS: Right-sided dialysis catheter with tip in the distal SVC. Cardiomegaly. Post median sternotomy. Bibasilar atelectasis, right greater than left. No pulmonary edema or pleural fluid. No free intra-abdominal air. No bowel dilatation to suggest obstruction. Scattered retained barium within colonic diverticula. Multiple enteric sutures and surgical clips in the pelvis. Small volume of colonic stool. IMPRESSION: 1. Normal bowel gas pattern. No free air. Retained barium within colonic diverticulum. 2. Mild bibasilar atelectasis.  Mild cardiomegaly. Electronically Signed   By: Jeb Levering M.D.   On: 03/22/2017 06:00   Ir Radiology Peripheral Guided Iv Start  Result Date: 03/28/2017 INDICATION: 76 year old with abdominal pain and being evaluated for mesenteric ischemia. Patient needs an abdominal CTA but does not have adequate venous access for the CTA examination. Patient is end-stage renal disease and not a candidate for PICC line. Patient has a left upper arm fistula which is maturing and a right jugular dialysis catheter. EXAM: PLACEMENT OF PERIPHERAL IV WITH ULTRASOUND GUIDANCE MEDICATIONS: None ANESTHESIA/SEDATION: None FLUOROSCOPY TIME:  None). COMPLICATIONS: None immediate. PROCEDURE: Right upper arm was evaluated with ultrasound. A patent basilic vein was identified. Right upper arm was prepped and draped in a sterile fashion. Tourniquet was placed. Skin was anesthetized  with 1% lidocaine. 21 gauge needle directed into the right basilic vein with ultrasound guidance. Micropuncture dilator set was placed. Catheter aspirated and flushed well. IMPRESSION: Successful placement of a peripheral  IV in the right upper arm. This is intended to be a temporary access that will be removed after the CTA procedure. Electronically Signed   By: Markus Daft M.D.   On: 03/28/2017 13:35   US Abdominal Pelvic Art/vent Flow Doppler  Result Date: 03/26/2017 CLINICAL DATA:  75 year old female with possible mesenteric ischemia. End-stage renal disease Cardiovascular risk factors include hypertension, known prior coronary artery disease, known prior vascular disease with vascular surgery. EXAM: [place] MESENTERIC ARTERIAL DOPPLER COMPARISON:  Noncontrast CT 03/22/2017, 11/18/2016 FINDINGS: Celiac axis: 262 cm/sec Celiac axis with inspiration: 220 cm/sec Celiac axis with expiration: 262 cm/sec Splenic artery: Splenic artery not visualize Hepatic artery: Hepatic artery not visualized SMA: 433 cm/sec IMA: 375 cm/sec IMPRESSION: Elevated velocities at the origin of the celiac artery, superior mesenteric artery, inferior mesenteric artery, compatible with greater than 70% stenosis at all 3 mesenteric vessels origin. Signed, Dulcy Fanny. Earleen Newport, DO [facility] Electronically Signed   By: Corrie Mckusick D.O.   On: 03/26/2017 10:46   Ct Angio Abd/pel W/ And/or W/o  Result Date: 03/28/2017 CLINICAL DATA:  Mesenteric ischemia EXAM: CTA ABDOMEN AND PELVIS wITHOUT AND WITH CONTRAST TECHNIQUE: Multidetector CT imaging of the abdomen and pelvis was performed using the standard protocol during bolus administration of intravenous contrast. Multiplanar reconstructed images and MIPs were obtained and reviewed to evaluate the vascular anatomy. CONTRAST:  80 cc Isovue 370 COMPARISON:  03/22/2017 FINDINGS: VASCULAR Aorta: There are extensive atherosclerotic calcifications  throughout the aorta. There is little if any soft plaque visualized in the lower thorax or abdomen. It is nonaneurysmal. Celiac: There is 70-80% narrowing at the origin likely due with to a combination of atherosclerosis an median arcuate ligament syndrome. Branch vessels are patent. SMA: There is 50% narrowing at the origin due to atherosclerotic plaque and calcification. Beyond the origin, it is patent. Branch vessels are grossly patent. Renals: Renal arteries are severely diminutive an atrophic. IMA: There is moderate narrowing at the origin. Branch vessels are patent. Inflow: Mild atherosclerotic calcifications in the right common and external iliac arteries without significant narrowing. Mild disease in the right internal iliac artery. Similarly, there is mild atherosclerotic calcification and some soft plaque in the left common iliac artery. The external iliac artery is pain. Internal iliac artery is patent. Proximal Outflow: The bilateral femoral arteries are grossly patent within the confines of the examination. There is some soft plaque in the right common femoral artery. There is also a small penetrating atherosclerotic ulcer. See table position -1017.3. Veins: Hepatic, portal, splenic, superior mesenteric, and bilateral renal veins are patent. IVC is patent. Bilateral common and external iliac veins are patent. Review of the MIP images confirms the above findings. NON-VASCULAR Lower chest: Dependent atelectasis bilaterally. Hepatobiliary: Subcentimeter hypodensity in the right lobe of the liver on image 20 is nonspecific. There is also a calcified granuloma in the central liver. Gallbladder is decompressed causing artificial wall thickening. Pancreas: Unremarkable Spleen: Unremarkable Adrenals/Urinary Tract: Severe atrophy of the kidneys is present. The appearance is unchanged compare to the prior study. There is a cyst in the medial mid left kidney. There is an enhancing heterogeneous mass in the upper  pole of the right kidney which is stable. Bladder is decompressed. Adrenal glands are within normal limits. Stomach/Bowel: There is a 2.2 x 2.2 cm gas and fluid filled abscess in the left upper quadrant between small bowel loops. No evidence of small-bowel obstruction. Inflammatory changes in the adjacent small bowel are improved. No  focal mass of the colon. Diverticulosis throughout the length of the colon is noted. There are postoperative changes in the location of the cecum. There are also postoperative changes at the sigmoid rectum junction. Lymphatic: No abnormal retroperitoneal adenopathy. Reproductive: Uterus is absent.  Adnexa are unremarkable. Other: There is no free fluid in the pelvis. Musculoskeletal: No vertebral compression deformity. Renal osteodystrophy changes in the spine are present. IMPRESSION: VASCULAR Significant narrowing at the origin of the celiac axis. 50% narrowing at the origin of the SMA. Moderate narrowing at the origin of the IMA. NON-VASCULAR There is a 2.2 cm gas and fluid filled abscess within the left upper quadrant between small bowel loops. Stable right upper lobe renal mass. Please see prior dictation for recommendations. Tiny hypodensity in the liver is nonspecific but likely benign. If patient has a history of malignancy, consider six-month follow-up MRI. Electronically Signed   By: Marybelle Killings M.D.   On: 03/28/2017 12:17    Assessment/Plan 1.  Complication dialysis device with thrombosis AV access:  Patient's right IJ Tunneled catheter is malfunctioning. The patient has an extremity access that is functioning well. Therefore, the patient will undergo removal of the tunneled catheter under local anesthesia.  The risks and benefits were described to the patient.  All questions were answered.  The patient agrees to proceed with angiography and intervention. Potassium will be drawn to ensure that it is an appropriate level prior to performing intervention. 2.  End-stage  renal disease requiring hemodialysis:  Patient will continue dialysis therapy without further interruption if a successful intervention is not achieved then a tunneled catheter will be placed. Dialysis has already been arranged. 3.  Hypertension:  Patient will continue medical management; nephrology is following no changes in oral medications. 4. Diabetes mellitus:  Glucose will be monitored and oral medications been held this morning once the patient has undergone the patient's procedure po intake will be reinitiated and again Accu-Cheks will be used to assess the blood glucose level and treat as needed. The patient will be restarted on the patient's usual hypoglycemic regime 5.  Coronary artery disease:  EKG will be monitored. Nitrates will be used if needed. The patient's oral cardiac medications will be continued.    Hortencia Pilar, MD  04/18/2017 2:54 PM

## 2017-04-19 ENCOUNTER — Encounter: Payer: Self-pay | Admitting: Vascular Surgery

## 2017-04-20 DIAGNOSIS — Z992 Dependence on renal dialysis: Secondary | ICD-10-CM | POA: Diagnosis not present

## 2017-04-20 DIAGNOSIS — Z7982 Long term (current) use of aspirin: Secondary | ICD-10-CM | POA: Diagnosis not present

## 2017-04-20 DIAGNOSIS — I120 Hypertensive chronic kidney disease with stage 5 chronic kidney disease or end stage renal disease: Secondary | ICD-10-CM | POA: Diagnosis not present

## 2017-04-20 DIAGNOSIS — E785 Hyperlipidemia, unspecified: Secondary | ICD-10-CM | POA: Diagnosis not present

## 2017-04-20 DIAGNOSIS — I480 Paroxysmal atrial fibrillation: Secondary | ICD-10-CM | POA: Diagnosis not present

## 2017-04-20 DIAGNOSIS — N186 End stage renal disease: Secondary | ICD-10-CM | POA: Diagnosis not present

## 2017-04-20 DIAGNOSIS — I2510 Atherosclerotic heart disease of native coronary artery without angina pectoris: Secondary | ICD-10-CM | POA: Diagnosis not present

## 2017-04-20 DIAGNOSIS — K5732 Diverticulitis of large intestine without perforation or abscess without bleeding: Secondary | ICD-10-CM | POA: Diagnosis not present

## 2017-04-25 DIAGNOSIS — I2510 Atherosclerotic heart disease of native coronary artery without angina pectoris: Secondary | ICD-10-CM | POA: Diagnosis not present

## 2017-04-25 DIAGNOSIS — Z7982 Long term (current) use of aspirin: Secondary | ICD-10-CM | POA: Diagnosis not present

## 2017-04-25 DIAGNOSIS — K5732 Diverticulitis of large intestine without perforation or abscess without bleeding: Secondary | ICD-10-CM | POA: Diagnosis not present

## 2017-04-25 DIAGNOSIS — N186 End stage renal disease: Secondary | ICD-10-CM | POA: Diagnosis not present

## 2017-04-25 DIAGNOSIS — I480 Paroxysmal atrial fibrillation: Secondary | ICD-10-CM | POA: Diagnosis not present

## 2017-04-25 DIAGNOSIS — Z992 Dependence on renal dialysis: Secondary | ICD-10-CM | POA: Diagnosis not present

## 2017-04-25 DIAGNOSIS — E785 Hyperlipidemia, unspecified: Secondary | ICD-10-CM | POA: Diagnosis not present

## 2017-04-25 DIAGNOSIS — I120 Hypertensive chronic kidney disease with stage 5 chronic kidney disease or end stage renal disease: Secondary | ICD-10-CM | POA: Diagnosis not present

## 2017-04-27 ENCOUNTER — Telehealth (INDEPENDENT_AMBULATORY_CARE_PROVIDER_SITE_OTHER): Payer: Self-pay | Admitting: *Deleted

## 2017-04-27 DIAGNOSIS — N186 End stage renal disease: Secondary | ICD-10-CM | POA: Diagnosis not present

## 2017-04-27 DIAGNOSIS — E785 Hyperlipidemia, unspecified: Secondary | ICD-10-CM | POA: Diagnosis not present

## 2017-04-27 DIAGNOSIS — I120 Hypertensive chronic kidney disease with stage 5 chronic kidney disease or end stage renal disease: Secondary | ICD-10-CM | POA: Diagnosis not present

## 2017-04-27 DIAGNOSIS — I2510 Atherosclerotic heart disease of native coronary artery without angina pectoris: Secondary | ICD-10-CM | POA: Diagnosis not present

## 2017-04-27 DIAGNOSIS — K5732 Diverticulitis of large intestine without perforation or abscess without bleeding: Secondary | ICD-10-CM | POA: Diagnosis not present

## 2017-04-27 DIAGNOSIS — I480 Paroxysmal atrial fibrillation: Secondary | ICD-10-CM | POA: Diagnosis not present

## 2017-04-27 DIAGNOSIS — Z992 Dependence on renal dialysis: Secondary | ICD-10-CM | POA: Diagnosis not present

## 2017-04-27 DIAGNOSIS — Z7982 Long term (current) use of aspirin: Secondary | ICD-10-CM | POA: Diagnosis not present

## 2017-04-27 NOTE — Telephone Encounter (Signed)
   Diagnosis:    Result(s)   Card 1: Positive:             Completed by: Thomas Hoff, LPN   HEMOCCULT SENSA DEVELOPER: LOT#:  [id number] EXPIRATION DATE: 2020/10    HEMOCCULT SENSA CARD:  LOT#:  [id number] EXPIRATION DATE: 03/20   CARD CONTROL RESULTS:  POSITIVE: Positive NEGATIVE: Negative    ADDITIONAL COMMENTS: Dr.Rehman was made aware.

## 2017-04-28 DIAGNOSIS — Z6824 Body mass index (BMI) 24.0-24.9, adult: Secondary | ICD-10-CM | POA: Diagnosis not present

## 2017-04-28 DIAGNOSIS — R141 Gas pain: Secondary | ICD-10-CM | POA: Diagnosis not present

## 2017-04-28 DIAGNOSIS — R1030 Lower abdominal pain, unspecified: Secondary | ICD-10-CM | POA: Diagnosis not present

## 2017-05-01 ENCOUNTER — Other Ambulatory Visit (INDEPENDENT_AMBULATORY_CARE_PROVIDER_SITE_OTHER): Payer: Self-pay | Admitting: *Deleted

## 2017-05-01 DIAGNOSIS — N186 End stage renal disease: Secondary | ICD-10-CM | POA: Diagnosis not present

## 2017-05-01 DIAGNOSIS — R195 Other fecal abnormalities: Secondary | ICD-10-CM

## 2017-05-01 DIAGNOSIS — Z992 Dependence on renal dialysis: Secondary | ICD-10-CM | POA: Diagnosis not present

## 2017-05-01 NOTE — Telephone Encounter (Signed)
Stol is heme positive Will check CBC.

## 2017-05-01 NOTE — Telephone Encounter (Signed)
CBC is noted and the patient will be made aware.

## 2017-05-02 DIAGNOSIS — Z7982 Long term (current) use of aspirin: Secondary | ICD-10-CM | POA: Diagnosis not present

## 2017-05-02 DIAGNOSIS — I480 Paroxysmal atrial fibrillation: Secondary | ICD-10-CM | POA: Diagnosis not present

## 2017-05-02 DIAGNOSIS — Z992 Dependence on renal dialysis: Secondary | ICD-10-CM | POA: Diagnosis not present

## 2017-05-02 DIAGNOSIS — K5732 Diverticulitis of large intestine without perforation or abscess without bleeding: Secondary | ICD-10-CM | POA: Diagnosis not present

## 2017-05-02 DIAGNOSIS — I120 Hypertensive chronic kidney disease with stage 5 chronic kidney disease or end stage renal disease: Secondary | ICD-10-CM | POA: Diagnosis not present

## 2017-05-02 DIAGNOSIS — E785 Hyperlipidemia, unspecified: Secondary | ICD-10-CM | POA: Diagnosis not present

## 2017-05-02 DIAGNOSIS — I2510 Atherosclerotic heart disease of native coronary artery without angina pectoris: Secondary | ICD-10-CM | POA: Diagnosis not present

## 2017-05-02 DIAGNOSIS — N186 End stage renal disease: Secondary | ICD-10-CM | POA: Diagnosis not present

## 2017-05-04 DIAGNOSIS — I480 Paroxysmal atrial fibrillation: Secondary | ICD-10-CM | POA: Diagnosis not present

## 2017-05-04 DIAGNOSIS — Z992 Dependence on renal dialysis: Secondary | ICD-10-CM | POA: Diagnosis not present

## 2017-05-04 DIAGNOSIS — I120 Hypertensive chronic kidney disease with stage 5 chronic kidney disease or end stage renal disease: Secondary | ICD-10-CM | POA: Diagnosis not present

## 2017-05-04 DIAGNOSIS — K5732 Diverticulitis of large intestine without perforation or abscess without bleeding: Secondary | ICD-10-CM | POA: Diagnosis not present

## 2017-05-04 DIAGNOSIS — Z7982 Long term (current) use of aspirin: Secondary | ICD-10-CM | POA: Diagnosis not present

## 2017-05-04 DIAGNOSIS — N186 End stage renal disease: Secondary | ICD-10-CM | POA: Diagnosis not present

## 2017-05-04 DIAGNOSIS — E785 Hyperlipidemia, unspecified: Secondary | ICD-10-CM | POA: Diagnosis not present

## 2017-05-04 DIAGNOSIS — I2510 Atherosclerotic heart disease of native coronary artery without angina pectoris: Secondary | ICD-10-CM | POA: Diagnosis not present

## 2017-05-09 ENCOUNTER — Encounter (INDEPENDENT_AMBULATORY_CARE_PROVIDER_SITE_OTHER): Payer: Self-pay

## 2017-05-09 ENCOUNTER — Encounter (INDEPENDENT_AMBULATORY_CARE_PROVIDER_SITE_OTHER): Payer: Self-pay | Admitting: Vascular Surgery

## 2017-05-09 ENCOUNTER — Ambulatory Visit (INDEPENDENT_AMBULATORY_CARE_PROVIDER_SITE_OTHER): Payer: Medicare HMO | Admitting: Vascular Surgery

## 2017-05-09 ENCOUNTER — Ambulatory Visit (INDEPENDENT_AMBULATORY_CARE_PROVIDER_SITE_OTHER): Payer: Medicare HMO

## 2017-05-09 VITALS — BP 176/63 | HR 73 | Resp 16 | Ht 61.0 in | Wt 128.6 lb

## 2017-05-09 DIAGNOSIS — T829XXS Unspecified complication of cardiac and vascular prosthetic device, implant and graft, sequela: Secondary | ICD-10-CM

## 2017-05-09 DIAGNOSIS — N186 End stage renal disease: Secondary | ICD-10-CM

## 2017-05-09 DIAGNOSIS — Z7982 Long term (current) use of aspirin: Secondary | ICD-10-CM | POA: Diagnosis not present

## 2017-05-09 DIAGNOSIS — Z992 Dependence on renal dialysis: Secondary | ICD-10-CM | POA: Diagnosis not present

## 2017-05-09 DIAGNOSIS — I120 Hypertensive chronic kidney disease with stage 5 chronic kidney disease or end stage renal disease: Secondary | ICD-10-CM | POA: Diagnosis not present

## 2017-05-09 DIAGNOSIS — I2510 Atherosclerotic heart disease of native coronary artery without angina pectoris: Secondary | ICD-10-CM | POA: Diagnosis not present

## 2017-05-09 DIAGNOSIS — E785 Hyperlipidemia, unspecified: Secondary | ICD-10-CM | POA: Diagnosis not present

## 2017-05-09 DIAGNOSIS — I480 Paroxysmal atrial fibrillation: Secondary | ICD-10-CM | POA: Diagnosis not present

## 2017-05-09 DIAGNOSIS — K5732 Diverticulitis of large intestine without perforation or abscess without bleeding: Secondary | ICD-10-CM | POA: Diagnosis not present

## 2017-05-09 NOTE — Progress Notes (Signed)
Subjective:    Patient ID: Shawna [name], female    DOB: [birth year], 76 y.o.   MRN: [id number] Chief Complaint  Patient presents with  . Follow-up    steal study   Patient presents with a chief complaint of left hand pain. The patient endorses a history of progressively worsening discomfort to her left her during dialysis. The patient notes she experiences tingling as well during dialysis. Patient denies any issues with her graft during dialysis. Patient notes that her left hand is more "pale" than her right. The patient denies any ulceration to the left hand.The patient underwent a left upper extremity HDA which was notable for significant steal syndrome.There is also an area of significant stenosis to the distal graft. Patient denies any fever, nausea or vomiting.   Review of Systems  Constitutional: Negative.   HENT: Negative.   Eyes: Negative.   Respiratory: Negative.   Cardiovascular: Negative.   Gastrointestinal: Negative.   Endocrine: Negative.   Genitourinary:       Issues with dialysis.  Musculoskeletal: Negative.   Skin: Negative.   Allergic/Immunologic: Negative.   Neurological: Negative.   Hematological: Negative.   Psychiatric/Behavioral: Negative.       Objective:   Physical Exam  Constitutional: She is oriented to person, place, and time. She appears well-developed and well-nourished. No distress.  HENT:  Head: Normocephalic and atraumatic.  Eyes: Pupils are equal, round, and reactive to light. Conjunctivae are normal.  Neck: Normal range of motion.  Cardiovascular: Normal rate, regular rhythm, normal heart sounds and intact distal pulses.   Pulses:      Radial pulses are 2+ on the right side, and 2+ on the left side.  The left hand is pale in comparison to the right There is a palpable radial pulse to the left hand  Pulmonary/Chest: Effort normal.  Musculoskeletal: Normal range of motion. She exhibits no edema.  Neurological: She is alert and oriented to  person, place, and time.  Skin: Skin is warm and dry. She is not diaphoretic.  Skin is intact. There is no ulceration. There is feeling to the left hand.  Psychiatric: She has a normal mood and affect. Her behavior is normal. Judgment and thought content normal.  Vitals reviewed.  BP (!) 176/63 (BP Location: Right Arm)   Pulse 73   Resp 16   Ht 5\' 1"  (1.549 m)   Wt 128 lb 9.6 oz (58.3 kg)   BMI 24.30 kg/m   Past Medical History:  Diagnosis Date  . Acute on chronic respiratory failure with hypoxia ([place]) 10/10/2016  . Anxiety   . Arthritis   . AVM (arteriovenous malformation) of colon   . CAD (coronary artery disease)    a. 12/2011 NSTEMI/Cath/PCI LCX (2.25x14 Resolute DES) & D1 (2.25x22 Resolute DES);  b. 01/2012 Cath/PCI: LM 30, LAD 30p, 40-16m, D1 stent ok, 99 in sm branch of diag, LCX patent stent, OM1 20, RCA 95 ost (4.0x12 Promus DES), EF 55%;  c. 04/2012 Lexi Cardiolite  EF 48%, small area of scar @ base/mid inflat wall with mild peri-infarct ischemia.; CABG 12/4  . Carotid artery disease ([place])    a. 02-77% LICA, 10/1285   . Chronic bronchitis ([place])   . Chronic diastolic CHF (congestive heart failure) ([place])    a. 02/2012 Echo EF 60-65%, nl wall motion, Gr 1 DD, mod MR  . Colon cancer ([place]) 1992  . Esophageal stricture   . ESRD on hemodialysis ([place])    ESRD due to  HTN, started dialysis 2011 and gets HD [facility] with Dr Hinda Lenis on MWF schedule.  Access is LUA AVF as of Sept 2014.   Marland Kitchen GERD (gastroesophageal reflux disease)   . High cholesterol 12/2011  . History of blood transfusion 07/2011; 12/2011; 01/2012 X 2; 04/2012  . History of gout   . History of lower GI bleeding   . Hypertension   . Iron deficiency anemia   . Mitral regurgitation    a. Moderate by echo, 02/2012  . Myocardial infarction ([place])   . Ovarian cancer ([place]) 1992  . Pneumonia ~ 2009  . PUD (peptic ulcer disease)   . TIA (transient ischemic attack)    Social History   Social History  . Marital status:  Married    Spouse name: N/A  . Number of children: N/A  . Years of education: N/A   Occupational History  . Not on file.   Social History Main Topics  . Smoking status: Never Smoker  . Smokeless tobacco: Never Used  . Alcohol use No  . Drug use: No  . Sexual activity: Yes    Birth control/ protection: Surgical   Other Topics Concern  . Not on file   Social History Narrative   Lives in [name], [place] with husband.  Dialysis pt - [facility].   Past Surgical History:  Procedure Laterality Date  . ABDOMINAL HYSTERECTOMY  1992  . APPENDECTOMY  06/1990  . AV FISTULA PLACEMENT  07/2009   left upper arm  . AV FISTULA PLACEMENT Right 09/06/2016   Procedure: RIGHT FOREARM ARTERIOVENOUS (AV) GRAFT;  Surgeon: Elam Dutch, MD;  Location: [facility] OR;  Service: Vascular;  Laterality: Right;  . AV FISTULA PLACEMENT N/A 02/24/2017   Procedure: INSERTION OF ARTERIOVENOUS (AV) GORE-TEX GRAFT ARM (BRACHIAL AXILLARY);  Surgeon: Katha Cabal, MD;  Location: [facility] ORS;  Service: Vascular;  Laterality: N/A;  . [place] Right 09/06/2016   Procedure: REMOVAL OF Right Arm ARTERIOVENOUS GORETEX GRAFT and Vein Patch angioplasty of brachial artery;  Surgeon: Angelia Mould, MD;  Location: [place];  Service: Vascular;  Laterality: Right;  . COLON RESECTION  1992  . COLON SURGERY    . CORONARY ANGIOPLASTY WITH STENT PLACEMENT  12/15/11   "2"  . CORONARY ANGIOPLASTY WITH STENT PLACEMENT  y/2013   "1; makes total of 3" (05/02/2012)  . CORONARY ARTERY BYPASS GRAFT  06/13/2012   Procedure: CORONARY ARTERY BYPASS GRAFTING (CABG);  Surgeon: Grace Isaac, MD;  Location: [place];  Service: Open Heart Surgery;  Laterality: N/A;  cabg x four;  using left internal mammary artery, and left leg greater saphenous vein harvested endoscopically  . CORONARY STENT INTERVENTION N/A 10/13/2016   Procedure: Coronary Stent Intervention;  Surgeon: Troy Sine, MD;  Location: [name] CV LAB;  Service: Cardiovascular;   Laterality: N/A;  . DIALYSIS/PERMA CATHETER REMOVAL N/A 04/18/2017   Procedure: DIALYSIS/PERMA CATHETER REMOVAL;  Surgeon: Katha Cabal, MD;  Location: [place] CV LAB;  Service: Cardiovascular;  Laterality: N/A;  . DILATION AND CURETTAGE OF UTERUS    . ESOPHAGOGASTRODUODENOSCOPY  01/20/2012   Procedure: ESOPHAGOGASTRODUODENOSCOPY (EGD);  Surgeon: Ladene Artist, MD,FACG;  Location: [facility] ENDOSCOPY;  Service: Endoscopy;  Laterality: N/A;  . ESOPHAGOGASTRODUODENOSCOPY N/A 03/26/2013   Procedure: ESOPHAGOGASTRODUODENOSCOPY (EGD);  Surgeon: Irene Shipper, MD;  Location: [facility] ENDOSCOPY;  Service: Endoscopy;  Laterality: N/A;  . ESOPHAGOGASTRODUODENOSCOPY N/A 04/30/2015   Procedure: ESOPHAGOGASTRODUODENOSCOPY (EGD);  Surgeon: Rogene Houston, MD;  [facility];  Service: Endoscopy;  Laterality: N/A;  1pm - moved to 10/20 @ 1:10  . ESOPHAGOGASTRODUODENOSCOPY N/A 07/29/2016   Procedure: ESOPHAGOGASTRODUODENOSCOPY (EGD);  Surgeon: Manus Gunning, MD;  Location: [place];  Service: Gastroenterology;  Laterality: N/A;  enteroscopy  . INTRAOPERATIVE TRANSESOPHAGEAL ECHOCARDIOGRAM  06/13/2012   Procedure: INTRAOPERATIVE TRANSESOPHAGEAL ECHOCARDIOGRAM;  Surgeon: Grace Isaac, MD;  Location: [place];  Service: Open Heart Surgery;  Laterality: N/A;  . IR GENERIC HISTORICAL  07/26/2016   IR FLUORO GUIDE CV LINE RIGHT 07/26/2016 Greggory Keen, MD [facility]INTER[facility]  . IR GENERIC HISTORICAL  07/26/2016   IR US GUIDE VASC ACCESS RIGHT 07/26/2016 Greggory Keen, MD [facility]  . IR GENERIC HISTORICAL  08/02/2016   IR US GUIDE VASC ACCESS RIGHT 08/02/2016 Greggory Keen, MD [facility]  . IR GENERIC HISTORICAL  08/02/2016   IR FLUORO GUIDE CV LINE RIGHT 08/02/2016 Greggory Keen, MD [facility]  . IR RADIOLOGY PERIPHERAL GUIDED IV START  03/28/2017  . IR US GUIDE VASC ACCESS RIGHT  03/28/2017  . LEFT HEART CATH AND CORONARY ANGIOGRAPHY N/A 09/20/2016   Procedure: Left Heart Cath and Coronary  Angiography;  Surgeon: Shawna Crome, MD;  Location: [place] CV LAB;  Service: Cardiovascular;  Laterality: N/A;  . LEFT HEART CATH AND CORS/GRAFTS ANGIOGRAPHY N/A 10/13/2016   Procedure: Left Heart Cath and Cors/Grafts Angiography;  Surgeon: Troy Sine, MD;  Location: [place] CV LAB;  Service: Cardiovascular;  Laterality: N/A;  . LEFT HEART CATHETERIZATION WITH CORONARY ANGIOGRAM N/A 12/15/2011   Procedure: LEFT HEART CATHETERIZATION WITH CORONARY ANGIOGRAM;  Surgeon: Burnell Blanks, MD;  Location: [facility] CATH LAB;  Service: Cardiovascular;  Laterality: N/A;  . LEFT HEART CATHETERIZATION WITH CORONARY ANGIOGRAM N/A 01/10/2012   Procedure: LEFT HEART CATHETERIZATION WITH CORONARY ANGIOGRAM;  Surgeon: Peter M Martinique, MD;  Location: [facility] CATH LAB;  Service: Cardiovascular;  Laterality: N/A;  . LEFT HEART CATHETERIZATION WITH CORONARY ANGIOGRAM N/A 06/08/2012   Procedure: LEFT HEART CATHETERIZATION WITH CORONARY ANGIOGRAM;  Surgeon: Burnell Blanks, MD;  Location: [name] CATH LAB;  Service: Cardiovascular;  Laterality: N/A;  . LEFT HEART CATHETERIZATION WITH CORONARY/GRAFT ANGIOGRAM N/A 12/10/2013   Procedure: LEFT HEART CATHETERIZATION WITH Beatrix Fetters;  Surgeon: [name], MD;  Location: [facility] CATH LAB;  Service: Cardiovascular;  Laterality: N/A;  . OVARY SURGERY     ovarian cancer  . REVISION OF ARTERIOVENOUS GORETEX GRAFT N/A 02/24/2017   Procedure: REVISION OF ARTERIOVENOUS GORETEX GRAFT (RESECTION);  Surgeon: Katha Cabal, MD;  Location: [facility] ORS;  Service: Vascular;  Laterality: N/A;  [place] N/A 10/15/2013   Procedure: Fistulogram;  Surgeon: Serafina Mitchell, MD;  Location: [facility] CATH LAB;  Service: Cardiovascular;  Laterality: N/A;  . THROMBECTOMY / ARTERIOVENOUS GRAFT REVISION  2011   left upper arm  . TUBAL LIGATION  1980's  . UPPER EXTREMITY ANGIOGRAPHY Bilateral 12/06/2016   Procedure: Upper Extremity Angiography;  Surgeon: Katha Cabal, MD;   Location: [place] CV LAB;  Service: Cardiovascular;  Laterality: Bilateral;   Family History  Problem Relation Age of Onset  . Heart disease Mother        Heart Disease before age 20  . Hyperlipidemia Mother   . Hypertension Mother   . Diabetes Mother   . Heart attack Mother   . Heart disease Father        Heart Disease before age 42  . Hyperlipidemia Father   . Hypertension Father   . Diabetes Father   .  Diabetes Sister   . Hypertension Sister   . Diabetes Brother   . Hyperlipidemia Brother   . Heart attack Brother   . Hypertension Sister   . Heart attack Brother   . Other Unknown        noncontributory for early CAD  . Colon cancer Neg Hx   . Esophageal cancer Neg Hx   . Liver disease Neg Hx   . Kidney disease Neg Hx   . Colon polyps Neg Hx    Allergies  Allergen Reactions  . Aspirin Other (See Comments)    High Doses Mess up her stomach; "makes my bowels have blood in them". Takes 81 mg EC Aspirin   . Penicillins Other (See Comments)    SYNCOPE? , "makes me real weak when I take it; like I'll pass out"  Has patient had a PCN reaction causing immediate rash, facial/tongue/throat swelling, SOB or lightheadedness with hypotension: Yes Has patient had a PCN reaction causing severe rash involving mucus membranes or skin necrosis: no Has patient had a PCN reaction that required hospitalization no Has patient had a PCN reaction occurring within the last 10 years: no If all of the above  . Amlodipine Swelling  . Bactrim [Sulfamethoxazole-Trimethoprim] Rash  . Contrast Media [Iodinated Diagnostic Agents] Itching  . Iron Itching and Other (See Comments)    "they gave me iron in dialysis; had to give me Benadryl cause I had to have the iron" (05/02/2012)  . Nitrofurantoin Hives  . Tylenol [Acetaminophen] Itching and Other (See Comments)    Makes her feet on fire per pt  . Gabapentin Other (See Comments)    unspecified  . Dexilant [Dexlansoprazole] Other (See  Comments)    Upset stomach  . Levaquin [Levofloxacin In D5w] Rash  . Morphine And Related Itching    Itching in feet  . Plavix [Clopidogrel Bisulfate] Rash  . Protonix [Pantoprazole Sodium] Rash  . Venofer [Ferric Oxide] Itching    Patient reports using Benadryl prior to doses as [place]:  Patient presents with a chief complaint of left hand pain. The patient endorses a history of progressively worsening discomfort to her left her during dialysis. The patient notes she experiences tingling as well during dialysis. Patient denies any issues with her graft during dialysis. Patient notes that her left hand is more "pale" than her right. The patient denies any ulceration to the left hand.The patient underwent a left upper extremity HDA which was notable for significant steal syndrome.There is also an area of significant stenosis to the distal graft. Patient denies any fever, nausea or vomiting.  1. End stage renal disease ([place]) - Stable Patient with left hand pain during dialysis Patient's left hand is more palate compared to the right. Patient does have a palpable radial pulse to the left hand. Steal seen on HDA today Recommend a left upper extremity shuntogram to assess anatomy and correct any areas of stenosis/steal. Procedure, risks and benefits explained to the patient All questions answered Patient wishes to proceed  2. Complication of vascular access for dialysis, sequela - Stable As above  Current Outpatient Prescriptions on File Prior to Visit  Medication Sig Dispense Refill  . ALPRAZolam (XANAX) 0.25 MG tablet Take 1 tablet (0.25 mg total) by mouth 2 (two) times daily as needed for anxiety or sleep. 10 tablet 0  . amiodarone (PACERONE) 200 MG tablet Take 1 tablet (200 mg total) by mouth daily. 30 tablet 3  .  aspirin EC 81 MG tablet Take 81 mg by mouth daily.     . carvedilol (COREG) 6.25 MG tablet Take 1 tablet (6.25 mg total) by mouth 2 (two) times  daily with a meal. 180 tablet 1  . cinacalcet (SENSIPAR) 30 MG tablet Take 30 mg by mouth daily after supper.    . diphenhydrAMINE (BENADRYL) 25 mg capsule Take 50 mg by mouth daily as needed for itching or allergies.     Marland Kitchen epoetin alfa (EPOGEN,PROCRIT) 38937 UNIT/ML injection Inject 1 mL (10,000 Units total) into the vein every Monday, Wednesday, and Friday with hemodialysis. 1 mL 10  . fluticasone (FLONASE) 50 MCG/ACT nasal spray Place 2 sprays into the nose at bedtime as needed for allergies.     . isosorbide mononitrate (IMDUR) 120 MG 24 hr tablet Take 1 tablet (120 mg total) by mouth daily. 30 tablet 2  . multivitamin (RENA-VIT) TABS tablet Take 1 tablet by mouth daily.    . nitroGLYCERIN (NITROSTAT) 0.4 MG SL tablet Place 1 tablet (0.4 mg total) under the tongue every 5 (five) minutes as needed for chest pain. 30 tablet 1  . ondansetron (ZOFRAN-ODT) 4 MG disintegrating tablet Take 4 mg by mouth 2 (two) times daily as needed for nausea or vomiting.    . sevelamer carbonate (RENVELA) 800 MG tablet Take 800 mg by mouth 3 (three) times daily with meals.    . simvastatin (ZOCOR) 20 MG tablet TAKE ONE TABLET BY MOUTH AT BEDTIME 90 tablet 3  . ticagrelor (BRILINTA) 90 MG TABS tablet Take 90 mg by mouth 2 (two) times daily.    . famotidine (PEPCID) 20 MG tablet Take 1 tablet (20 mg total) by mouth every other day. 15 tablet 0  . hydrALAZINE (APRESOLINE) 25 MG tablet Take 1 tablet (25 mg total) by mouth 2 (two) times daily. 60 tablet 0  . oxycodone (OXY-IR) 5 MG capsule Take 1 capsule (5 mg total) by mouth every 6 (six) hours as needed for pain. (Patient not taking: Reported on 05/09/2017) 10 capsule 0   No current facility-administered medications on file prior to visit.    There are no Patient Instructions on file for this visit. No Follow-up on file.  Annete Ayuso A Cherilyn Sautter, PA-C

## 2017-05-11 ENCOUNTER — Other Ambulatory Visit: Payer: Self-pay | Admitting: Cardiovascular Disease

## 2017-05-11 ENCOUNTER — Other Ambulatory Visit: Payer: Self-pay | Admitting: *Deleted

## 2017-05-11 DIAGNOSIS — I480 Paroxysmal atrial fibrillation: Secondary | ICD-10-CM | POA: Diagnosis not present

## 2017-05-11 DIAGNOSIS — N186 End stage renal disease: Secondary | ICD-10-CM | POA: Diagnosis not present

## 2017-05-11 DIAGNOSIS — I2510 Atherosclerotic heart disease of native coronary artery without angina pectoris: Secondary | ICD-10-CM | POA: Diagnosis not present

## 2017-05-11 DIAGNOSIS — Z7982 Long term (current) use of aspirin: Secondary | ICD-10-CM | POA: Diagnosis not present

## 2017-05-11 DIAGNOSIS — I120 Hypertensive chronic kidney disease with stage 5 chronic kidney disease or end stage renal disease: Secondary | ICD-10-CM | POA: Diagnosis not present

## 2017-05-11 DIAGNOSIS — Z992 Dependence on renal dialysis: Secondary | ICD-10-CM | POA: Diagnosis not present

## 2017-05-11 DIAGNOSIS — E785 Hyperlipidemia, unspecified: Secondary | ICD-10-CM | POA: Diagnosis not present

## 2017-05-11 DIAGNOSIS — K5732 Diverticulitis of large intestine without perforation or abscess without bleeding: Secondary | ICD-10-CM | POA: Diagnosis not present

## 2017-05-11 MED ORDER — ISOSORBIDE MONONITRATE ER 120 MG PO TB24: 120.0000 mg | ORAL_TABLET | Freq: Every day | ORAL | 6 refills | Status: DC

## 2017-05-11 NOTE — Telephone Encounter (Signed)
(  BRILINTA) 90 MG TABS tablet   Patient is asking if she can be prescribed a generic medication for [employer]

## 2017-05-11 NOTE — Telephone Encounter (Signed)
Patient notified there would be no cheaper cost to brilinta

## 2017-05-12 DIAGNOSIS — Z992 Dependence on renal dialysis: Secondary | ICD-10-CM | POA: Diagnosis not present

## 2017-05-12 DIAGNOSIS — N186 End stage renal disease: Secondary | ICD-10-CM | POA: Diagnosis not present

## 2017-05-15 DIAGNOSIS — N186 End stage renal disease: Secondary | ICD-10-CM | POA: Diagnosis not present

## 2017-05-15 DIAGNOSIS — Z992 Dependence on renal dialysis: Secondary | ICD-10-CM | POA: Diagnosis not present

## 2017-05-16 DIAGNOSIS — N186 End stage renal disease: Secondary | ICD-10-CM | POA: Diagnosis not present

## 2017-05-16 DIAGNOSIS — E785 Hyperlipidemia, unspecified: Secondary | ICD-10-CM | POA: Diagnosis not present

## 2017-05-16 DIAGNOSIS — I120 Hypertensive chronic kidney disease with stage 5 chronic kidney disease or end stage renal disease: Secondary | ICD-10-CM | POA: Diagnosis not present

## 2017-05-16 DIAGNOSIS — Z992 Dependence on renal dialysis: Secondary | ICD-10-CM | POA: Diagnosis not present

## 2017-05-16 DIAGNOSIS — I480 Paroxysmal atrial fibrillation: Secondary | ICD-10-CM | POA: Diagnosis not present

## 2017-05-16 DIAGNOSIS — K5732 Diverticulitis of large intestine without perforation or abscess without bleeding: Secondary | ICD-10-CM | POA: Diagnosis not present

## 2017-05-16 DIAGNOSIS — Z7982 Long term (current) use of aspirin: Secondary | ICD-10-CM | POA: Diagnosis not present

## 2017-05-16 DIAGNOSIS — I2510 Atherosclerotic heart disease of native coronary artery without angina pectoris: Secondary | ICD-10-CM | POA: Diagnosis not present

## 2017-05-17 DIAGNOSIS — N186 End stage renal disease: Secondary | ICD-10-CM | POA: Diagnosis not present

## 2017-05-17 DIAGNOSIS — Z992 Dependence on renal dialysis: Secondary | ICD-10-CM | POA: Diagnosis not present

## 2017-05-18 DIAGNOSIS — I480 Paroxysmal atrial fibrillation: Secondary | ICD-10-CM | POA: Diagnosis not present

## 2017-05-18 DIAGNOSIS — Z992 Dependence on renal dialysis: Secondary | ICD-10-CM | POA: Diagnosis not present

## 2017-05-18 DIAGNOSIS — E785 Hyperlipidemia, unspecified: Secondary | ICD-10-CM | POA: Diagnosis not present

## 2017-05-18 DIAGNOSIS — I2510 Atherosclerotic heart disease of native coronary artery without angina pectoris: Secondary | ICD-10-CM | POA: Diagnosis not present

## 2017-05-18 DIAGNOSIS — Z7982 Long term (current) use of aspirin: Secondary | ICD-10-CM | POA: Diagnosis not present

## 2017-05-18 DIAGNOSIS — K5732 Diverticulitis of large intestine without perforation or abscess without bleeding: Secondary | ICD-10-CM | POA: Diagnosis not present

## 2017-05-18 DIAGNOSIS — N186 End stage renal disease: Secondary | ICD-10-CM | POA: Diagnosis not present

## 2017-05-18 DIAGNOSIS — I120 Hypertensive chronic kidney disease with stage 5 chronic kidney disease or end stage renal disease: Secondary | ICD-10-CM | POA: Diagnosis not present

## 2017-05-19 DIAGNOSIS — Z992 Dependence on renal dialysis: Secondary | ICD-10-CM | POA: Diagnosis not present

## 2017-05-19 DIAGNOSIS — Z6824 Body mass index (BMI) 24.0-24.9, adult: Secondary | ICD-10-CM | POA: Diagnosis not present

## 2017-05-19 DIAGNOSIS — L0591 Pilonidal cyst without abscess: Secondary | ICD-10-CM | POA: Diagnosis not present

## 2017-05-19 DIAGNOSIS — I10 Essential (primary) hypertension: Secondary | ICD-10-CM | POA: Diagnosis not present

## 2017-05-19 DIAGNOSIS — N186 End stage renal disease: Secondary | ICD-10-CM | POA: Diagnosis not present

## 2017-05-19 DIAGNOSIS — Z1389 Encounter for screening for other disorder: Secondary | ICD-10-CM | POA: Diagnosis not present

## 2017-05-22 DIAGNOSIS — N186 End stage renal disease: Secondary | ICD-10-CM | POA: Diagnosis not present

## 2017-05-22 DIAGNOSIS — Z992 Dependence on renal dialysis: Secondary | ICD-10-CM | POA: Diagnosis not present

## 2017-05-23 DIAGNOSIS — I2510 Atherosclerotic heart disease of native coronary artery without angina pectoris: Secondary | ICD-10-CM | POA: Diagnosis not present

## 2017-05-23 DIAGNOSIS — I480 Paroxysmal atrial fibrillation: Secondary | ICD-10-CM | POA: Diagnosis not present

## 2017-05-23 DIAGNOSIS — N186 End stage renal disease: Secondary | ICD-10-CM | POA: Diagnosis not present

## 2017-05-23 DIAGNOSIS — K5732 Diverticulitis of large intestine without perforation or abscess without bleeding: Secondary | ICD-10-CM | POA: Diagnosis not present

## 2017-05-23 DIAGNOSIS — Z992 Dependence on renal dialysis: Secondary | ICD-10-CM | POA: Diagnosis not present

## 2017-05-23 DIAGNOSIS — E785 Hyperlipidemia, unspecified: Secondary | ICD-10-CM | POA: Diagnosis not present

## 2017-05-23 DIAGNOSIS — I120 Hypertensive chronic kidney disease with stage 5 chronic kidney disease or end stage renal disease: Secondary | ICD-10-CM | POA: Diagnosis not present

## 2017-05-23 DIAGNOSIS — Z7982 Long term (current) use of aspirin: Secondary | ICD-10-CM | POA: Diagnosis not present

## 2017-05-24 DIAGNOSIS — Z992 Dependence on renal dialysis: Secondary | ICD-10-CM | POA: Diagnosis not present

## 2017-05-24 DIAGNOSIS — N186 End stage renal disease: Secondary | ICD-10-CM | POA: Diagnosis not present

## 2017-05-25 DIAGNOSIS — E8770 Fluid overload, unspecified: Secondary | ICD-10-CM | POA: Diagnosis not present

## 2017-05-25 DIAGNOSIS — Z992 Dependence on renal dialysis: Secondary | ICD-10-CM | POA: Diagnosis not present

## 2017-05-25 DIAGNOSIS — N186 End stage renal disease: Secondary | ICD-10-CM | POA: Diagnosis not present

## 2017-05-26 ENCOUNTER — Encounter (INDEPENDENT_AMBULATORY_CARE_PROVIDER_SITE_OTHER): Payer: Self-pay

## 2017-05-26 DIAGNOSIS — Z992 Dependence on renal dialysis: Secondary | ICD-10-CM | POA: Diagnosis not present

## 2017-05-26 DIAGNOSIS — N186 End stage renal disease: Secondary | ICD-10-CM | POA: Diagnosis not present

## 2017-05-29 ENCOUNTER — Other Ambulatory Visit (INDEPENDENT_AMBULATORY_CARE_PROVIDER_SITE_OTHER): Payer: Self-pay | Admitting: Vascular Surgery

## 2017-05-29 DIAGNOSIS — N186 End stage renal disease: Secondary | ICD-10-CM | POA: Diagnosis not present

## 2017-05-29 DIAGNOSIS — Z992 Dependence on renal dialysis: Secondary | ICD-10-CM | POA: Diagnosis not present

## 2017-05-30 ENCOUNTER — Encounter (INDEPENDENT_AMBULATORY_CARE_PROVIDER_SITE_OTHER): Payer: Self-pay | Admitting: Internal Medicine

## 2017-05-30 ENCOUNTER — Ambulatory Visit (INDEPENDENT_AMBULATORY_CARE_PROVIDER_SITE_OTHER): Payer: Medicare HMO | Admitting: Internal Medicine

## 2017-05-30 VITALS — BP 140/82 | HR 67 | Temp 98.2°F | Resp 18 | Ht 62.0 in | Wt 128.4 lb

## 2017-05-30 DIAGNOSIS — Z8719 Personal history of other diseases of the digestive system: Secondary | ICD-10-CM

## 2017-05-30 DIAGNOSIS — D649 Anemia, unspecified: Secondary | ICD-10-CM | POA: Diagnosis not present

## 2017-05-30 DIAGNOSIS — N186 End stage renal disease: Secondary | ICD-10-CM | POA: Diagnosis not present

## 2017-05-30 DIAGNOSIS — L290 Pruritus ani: Secondary | ICD-10-CM | POA: Diagnosis not present

## 2017-05-30 DIAGNOSIS — I480 Paroxysmal atrial fibrillation: Secondary | ICD-10-CM | POA: Diagnosis not present

## 2017-05-30 DIAGNOSIS — E785 Hyperlipidemia, unspecified: Secondary | ICD-10-CM | POA: Diagnosis not present

## 2017-05-30 DIAGNOSIS — K5732 Diverticulitis of large intestine without perforation or abscess without bleeding: Secondary | ICD-10-CM | POA: Diagnosis not present

## 2017-05-30 DIAGNOSIS — Z992 Dependence on renal dialysis: Secondary | ICD-10-CM | POA: Diagnosis not present

## 2017-05-30 DIAGNOSIS — Z7982 Long term (current) use of aspirin: Secondary | ICD-10-CM | POA: Diagnosis not present

## 2017-05-30 DIAGNOSIS — I120 Hypertensive chronic kidney disease with stage 5 chronic kidney disease or end stage renal disease: Secondary | ICD-10-CM | POA: Diagnosis not present

## 2017-05-30 DIAGNOSIS — I2510 Atherosclerotic heart disease of native coronary artery without angina pectoris: Secondary | ICD-10-CM | POA: Diagnosis not present

## 2017-05-30 DIAGNOSIS — Z8619 Personal history of other infectious and parasitic diseases: Secondary | ICD-10-CM

## 2017-05-30 MED ORDER — NYSTATIN-TRIAMCINOLONE 100000-0.1 UNIT/GM-% EX CREA: 1.0000 "application " | TOPICAL_CREAM | Freq: Two times a day (BID) | CUTANEOUS | 1 refills | Status: DC

## 2017-05-30 NOTE — Progress Notes (Signed)
Presenting complaint;  Follow-up for history of enteritis and anemia. Patient complains of perianal irritation.  Subjective:  Patient is 76 year old F American female with multiple medical problems who was hospitalized at [facility] on 03/22/2017 for abdominal pain.  She was felt to have enteritis with focal fluid collection and felt to have walled off perforation/small abscess.  I was concerned that she had a ischemic injury.  She was subsequently transferred to [facility].  She had CT angiogram abdomen and her disease was felt to be noncritical.  She responded to medical therapy.  She has not had any more episodes of pain.  She is having 2-3 bowel movements per day.  Stools are formed.  Most of her bowel movements occur after meals.  She denies melena or rectal bleeding.  She complains of rectal discharge and irritation of the perianal skin.  She was seen by Dr. [name].  She was treated with cephalexin which she has taken for more than 7 days.  She says rectal discharge has decreased but perianal skin irritation has not improved.  She has been using proctoscopy cream HC.  She is getting her hemoglobin checked at the dialysis clinic but she does not know the value from last blood test. Her appetite is good and her weight has been stable.   Current Medications: Outpatient Encounter Medications as of 05/30/2017  Medication Sig  . ALPRAZolam (XANAX) 0.25 MG tablet Take 1 tablet (0.25 mg total) by mouth 2 (two) times daily as needed for anxiety or sleep.  Marland Kitchen amiodarone (PACERONE) 200 MG tablet Take 1 tablet (200 mg total) by mouth daily.  Marland Kitchen aspirin EC 81 MG tablet Take 81 mg by mouth daily.   . carvedilol (COREG) 6.25 MG tablet Take 1 tablet (6.25 mg total) by mouth 2 (two) times daily with a meal.  . cinacalcet (SENSIPAR) 30 MG tablet Take 30 mg by mouth daily after supper.  . diphenhydrAMINE (BENADRYL) 25 mg capsule Take 50 mg by mouth daily as needed for itching or allergies.   Marland Kitchen epoetin alfa  (EPOGEN,PROCRIT) 10175 UNIT/ML injection Inject 1 mL (10,000 Units total) into the vein every Monday, Wednesday, and Friday with hemodialysis.  . fluticasone (FLONASE) 50 MCG/ACT nasal spray Place 1 spray at bedtime as needed into both nostrils for allergies.   . isosorbide mononitrate (IMDUR) 120 MG 24 hr tablet Take 1 tablet (120 mg total) by mouth daily.  Marland Kitchen lidocaine-prilocaine (EMLA) cream Apply 1 application every Monday, Wednesday, and Friday topically.  . multivitamin (RENA-VIT) TABS tablet Take 1 tablet by mouth daily.  . nitroGLYCERIN (NITROSTAT) 0.4 MG SL tablet Place 1 tablet (0.4 mg total) under the tongue every 5 (five) minutes as needed for chest pain.  Marland Kitchen ondansetron (ZOFRAN-ODT) 4 MG disintegrating tablet Take 4 mg by mouth 2 (two) times daily as needed for nausea or vomiting.  . sevelamer carbonate (RENVELA) 800 MG tablet Take 800 mg by mouth 3 (three) times daily with meals.  . simvastatin (ZOCOR) 20 MG tablet TAKE ONE TABLET BY MOUTH AT BEDTIME (Patient taking differently: TAKE 20 MG BY MOUTH AT BEDTIME)  . ticagrelor (BRILINTA) 90 MG TABS tablet Take 90 mg by mouth 2 (two) times daily.  . cloNIDine (CATAPRES) 0.2 MG tablet Take 0.2 mg daily by mouth.  . famotidine (PEPCID) 20 MG tablet Take 1 tablet (20 mg total) by mouth every other day. (Patient taking differently: Take 20 mg daily as needed by mouth for heartburn or indigestion. )  . hydrALAZINE (APRESOLINE) 25 MG  tablet Take 1 tablet (25 mg total) by mouth 2 (two) times daily. (Patient taking differently: Take 100 mg 3 (three) times daily as needed by mouth (for HBP). )  . oxycodone (OXY-IR) 5 MG capsule Take 1 capsule (5 mg total) by mouth every 6 (six) hours as needed for pain. (Patient not taking: Reported on 05/09/2017)   No facility-administered encounter medications on file as of 05/30/2017.      Objective: Blood pressure 140/82, pulse 67, temperature 98.2 F (36.8 C), temperature source Oral, resp. rate 18, height  5\' 2"  (1.575 m), weight 128 lb 6 oz (58.2 kg). Patient is alert and in no acute distress. Conjunctiva is pink. Sclera is nonicteric Oropharyngeal mucosa is normal. No neck masses or thyromegaly noted. Cardiac exam with regular rhythm normal S1 and S2. No murmur or gallop noted. Lungs are clear to auscultation. Abdomen is full.  Bowel sounds are normal.  No bruit noted.  On palpation abdomen is soft and nontender without organomegaly or masses. Rectal examination limited to external inspection.  She has very small superficial ulcer in the midline anterior to the coccyx.  This appears to be healing.  There is some erythema of the perianal skin but no ulcers. No LE edema or clubbing noted.   Assessment:  #1.  Recent hospitalization for enteritis with small abscess.  She responded to antibiotic therapy.  Etiology unclear but ischemic injury likely.  She could also have had jejunal diverticulitis.  Doubt that it was a viral infection.  Patient advised to report to the emergency room if she has similar pain in the future.  Patient advised not to take OTC NSAIDs.  #2.  Anemia.  She has anemia of chronic disease and in addition she has anemia due to GI blood loss.  She could have small bowel ulcers or AV malformations.  If hemoglobin drops to the point that she needs blood transfusion will consider given capsule study which may also be helpful in sorting out explanation for recent episode of enteritis.  #3.  Pruritus ani.  She may have fungal skin infection.   Plan:  Discontinue cephalexin and ProctoCream. Mycolog-II cream to be applied to peri-area twice daily for 1-2 weeks and thereafter on as-needed basis. Patient will have CBC at the time of hemodialysis next week. Patient advised to call office or go to emergency room if she has recurrence for abdominal pain melena or rectal bleeding. Office visit in 4 months.

## 2017-05-30 NOTE — Patient Instructions (Signed)
Notify if you have melena or rectal bleeding or abdominal pain recurs.

## 2017-05-31 DIAGNOSIS — Z992 Dependence on renal dialysis: Secondary | ICD-10-CM | POA: Diagnosis not present

## 2017-05-31 DIAGNOSIS — N186 End stage renal disease: Secondary | ICD-10-CM | POA: Diagnosis not present

## 2017-06-01 DIAGNOSIS — E785 Hyperlipidemia, unspecified: Secondary | ICD-10-CM | POA: Diagnosis not present

## 2017-06-01 DIAGNOSIS — I120 Hypertensive chronic kidney disease with stage 5 chronic kidney disease or end stage renal disease: Secondary | ICD-10-CM | POA: Diagnosis not present

## 2017-06-01 DIAGNOSIS — N186 End stage renal disease: Secondary | ICD-10-CM | POA: Diagnosis not present

## 2017-06-01 DIAGNOSIS — I2510 Atherosclerotic heart disease of native coronary artery without angina pectoris: Secondary | ICD-10-CM | POA: Diagnosis not present

## 2017-06-01 DIAGNOSIS — Z7982 Long term (current) use of aspirin: Secondary | ICD-10-CM | POA: Diagnosis not present

## 2017-06-01 DIAGNOSIS — K5732 Diverticulitis of large intestine without perforation or abscess without bleeding: Secondary | ICD-10-CM | POA: Diagnosis not present

## 2017-06-01 DIAGNOSIS — Z992 Dependence on renal dialysis: Secondary | ICD-10-CM | POA: Diagnosis not present

## 2017-06-01 DIAGNOSIS — I480 Paroxysmal atrial fibrillation: Secondary | ICD-10-CM | POA: Diagnosis not present

## 2017-06-02 DIAGNOSIS — Z992 Dependence on renal dialysis: Secondary | ICD-10-CM | POA: Diagnosis not present

## 2017-06-02 DIAGNOSIS — N186 End stage renal disease: Secondary | ICD-10-CM | POA: Diagnosis not present

## 2017-06-03 DIAGNOSIS — N186 End stage renal disease: Secondary | ICD-10-CM | POA: Diagnosis not present

## 2017-06-03 DIAGNOSIS — Z992 Dependence on renal dialysis: Secondary | ICD-10-CM | POA: Diagnosis not present

## 2017-06-03 DIAGNOSIS — E8770 Fluid overload, unspecified: Secondary | ICD-10-CM | POA: Diagnosis not present

## 2017-06-05 ENCOUNTER — Ambulatory Visit (INDEPENDENT_AMBULATORY_CARE_PROVIDER_SITE_OTHER): Payer: Medicare HMO | Admitting: Cardiovascular Disease

## 2017-06-05 ENCOUNTER — Encounter: Payer: Self-pay | Admitting: Cardiovascular Disease

## 2017-06-05 VITALS — BP 148/60 | HR 62 | Ht 61.0 in | Wt 125.0 lb

## 2017-06-05 DIAGNOSIS — I2510 Atherosclerotic heart disease of native coronary artery without angina pectoris: Secondary | ICD-10-CM | POA: Insufficient documentation

## 2017-06-05 DIAGNOSIS — I6523 Occlusion and stenosis of bilateral carotid arteries: Secondary | ICD-10-CM | POA: Diagnosis not present

## 2017-06-05 DIAGNOSIS — I25708 Atherosclerosis of coronary artery bypass graft(s), unspecified, with other forms of angina pectoris: Secondary | ICD-10-CM | POA: Diagnosis not present

## 2017-06-05 DIAGNOSIS — Z992 Dependence on renal dialysis: Secondary | ICD-10-CM | POA: Diagnosis not present

## 2017-06-05 DIAGNOSIS — E785 Hyperlipidemia, unspecified: Secondary | ICD-10-CM | POA: Diagnosis not present

## 2017-06-05 DIAGNOSIS — N186 End stage renal disease: Secondary | ICD-10-CM | POA: Diagnosis not present

## 2017-06-05 DIAGNOSIS — I10 Essential (primary) hypertension: Secondary | ICD-10-CM

## 2017-06-05 DIAGNOSIS — Z79899 Other long term (current) drug therapy: Secondary | ICD-10-CM | POA: Diagnosis not present

## 2017-06-05 DIAGNOSIS — I5032 Chronic diastolic (congestive) heart failure: Secondary | ICD-10-CM | POA: Diagnosis not present

## 2017-06-05 DIAGNOSIS — E7800 Pure hypercholesterolemia, unspecified: Secondary | ICD-10-CM | POA: Diagnosis not present

## 2017-06-05 MED ORDER — NITROGLYCERIN 0.4 MG SL SUBL
0.4000 mg | SUBLINGUAL_TABLET | SUBLINGUAL | 3 refills | Status: DC | PRN
Start: 2017-06-05 — End: 2018-08-17

## 2017-06-05 NOTE — Patient Instructions (Signed)
Medication Instructions:  Continue all current medications.  Labwork: none  Testing/Procedures: none  Follow-Up: 3-4 months   Any Other Special Instructions Will Be Listed Below (If Applicable).  If you need a refill on your cardiac medications before your next appointment, please call your pharmacy.  

## 2017-06-05 NOTE — Progress Notes (Signed)
SUBJECTIVE: The patient presents for routine follow-up.  She was hospitalized in September 2018 for small bowel enteritis with mesenteric ischemia and intra-abdominal abscesses.  Her coronary disease remained stable at that time.  She was hospitalized for hypertensive urgency in August 2018.  She sustained a non-STEMI in April 2018 and underwent percutaneous coronary intervention to a subtotally occluded diagonal vessel with a drug-eluting stent. She had a patent LIMA graft to the LAD and a patent SVG supplying the ramus intermedius and patent SVG supplying the distal RCA.  Echocardiogram on 10/12/16 showed normal left ventricular systolic function and regional wall motion, LVEF 81-19%, grade 1 diastolic dysfunction, elevated filling pressures.  She has been doing well as of late.  She had some precordial pain on Sunday relieved with one nitroglycerin tablet.  She had a good Thanksgiving at her niece's house.  She denies shortness of breath, palpitations, leg swelling.  She is scheduled for vascular surgery tomorrow for AV fistula repair.   Review of Systems: As per "subjective", otherwise negative.  Allergies  Allergen Reactions  . Aspirin Other (See Comments)    High Doses Mess up her stomach; "makes my bowels have blood in them". Takes 81 mg EC Aspirin   . Penicillins Other (See Comments)    SYNCOPE? , "makes me real weak when I take it; like I'll pass out"  Has patient had a PCN reaction causing immediate rash, facial/tongue/throat swelling, SOB or lightheadedness with hypotension: Yes Has patient had a PCN reaction causing severe rash involving mucus membranes or skin necrosis: no Has patient had a PCN reaction that required hospitalization no Has patient had a PCN reaction occurring within the last 10 years: no If all of the above  . Amlodipine Swelling  . Bactrim [Sulfamethoxazole-Trimethoprim] Rash  . Contrast Media [Iodinated Diagnostic Agents] Itching  . Iron Itching  and Other (See Comments)    "they gave me iron in dialysis; had to give me Benadryl cause I had to have the iron" (05/02/2012)  . Nitrofurantoin Hives  . Tylenol [Acetaminophen] Itching and Other (See Comments)    Makes her feet on fire per pt  . Gabapentin Other (See Comments)    unspecified  . Dexilant [Dexlansoprazole] Other (See Comments)    Upset stomach  . Levaquin [Levofloxacin In D5w] Rash  . Morphine And Related Itching and Other (See Comments)    Itching in feet  . Plavix [Clopidogrel Bisulfate] Rash  . Protonix [Pantoprazole Sodium] Rash  . Venofer [Ferric Oxide] Itching and Other (See Comments)    Patient reports using Benadryl prior to doses as [facility]    Current Outpatient Medications  Medication Sig Dispense Refill  . ALPRAZolam (XANAX) 0.25 MG tablet Take 1 tablet (0.25 mg total) by mouth 2 (two) times daily as needed for anxiety or sleep. 10 tablet 0  . amiodarone (PACERONE) 200 MG tablet Take 1 tablet (200 mg total) by mouth daily. 30 tablet 3  . aspirin EC 81 MG tablet Take 81 mg by mouth daily.     . carvedilol (COREG) 6.25 MG tablet Take 1 tablet (6.25 mg total) by mouth 2 (two) times daily with a meal. 180 tablet 1  . cinacalcet (SENSIPAR) 30 MG tablet Take 30 mg by mouth daily after supper.    . cloNIDine (CATAPRES) 0.2 MG tablet Take 0.2 mg daily by mouth.    . diphenhydrAMINE (BENADRYL) 25 mg capsule Take 50 mg by mouth daily as needed for itching or allergies.     Marland Kitchen  epoetin alfa (EPOGEN,PROCRIT) 59563 UNIT/ML injection Inject 1 mL (10,000 Units total) into the vein every Monday, Wednesday, and Friday with hemodialysis. 1 mL 10  . fluticasone (FLONASE) 50 MCG/ACT nasal spray Place 1 spray at bedtime as needed into both nostrils for allergies.     . isosorbide mononitrate (IMDUR) 120 MG 24 hr tablet Take 1 tablet (120 mg total) by mouth daily. 30 tablet 6  . lidocaine-prilocaine (EMLA) cream Apply 1 application every Monday, Wednesday, and Friday  topically.    . multivitamin (RENA-VIT) TABS tablet Take 1 tablet by mouth daily.    . nitroGLYCERIN (NITROSTAT) 0.4 MG SL tablet Place 1 tablet (0.4 mg total) under the tongue every 5 (five) minutes as needed for chest pain. 30 tablet 1  . nystatin-triamcinolone (MYCOLOG II) cream Apply 1 application topically 2 (two) times daily. 30 g 1  . ondansetron (ZOFRAN-ODT) 4 MG disintegrating tablet Take 4 mg by mouth 2 (two) times daily as needed for nausea or vomiting.    Marland Kitchen oxycodone (OXY-IR) 5 MG capsule Take 1 capsule (5 mg total) by mouth every 6 (six) hours as needed for pain. 10 capsule 0  . sevelamer carbonate (RENVELA) 800 MG tablet Take 800 mg by mouth 3 (three) times daily with meals.    . simvastatin (ZOCOR) 20 MG tablet TAKE ONE TABLET BY MOUTH AT BEDTIME (Patient taking differently: TAKE 20 MG BY MOUTH AT BEDTIME) 90 tablet 3  . ticagrelor (BRILINTA) 90 MG TABS tablet Take 90 mg by mouth 2 (two) times daily.    . famotidine (PEPCID) 20 MG tablet Take 1 tablet (20 mg total) by mouth every other day. (Patient taking differently: Take 20 mg daily as needed by mouth for heartburn or indigestion. ) 15 tablet 0  . hydrALAZINE (APRESOLINE) 25 MG tablet Take 1 tablet (25 mg total) by mouth 2 (two) times daily. (Patient taking differently: Take 100 mg 3 (three) times daily as needed by mouth (for HBP). ) 60 tablet 0   No current facility-administered medications for this visit.     Past Medical History:  Diagnosis Date  . Acute on chronic respiratory failure with hypoxia ([place]) 10/10/2016  . Anxiety   . Arthritis   . AVM (arteriovenous malformation) of colon   . CAD (coronary artery disease)    a. 12/2011 NSTEMI/Cath/PCI LCX (2.25x14 Resolute DES) & D1 (2.25x22 Resolute DES);  b. 01/2012 Cath/PCI: LM 30, LAD 30p, 40-31m, D1 stent ok, 99 in sm branch of diag, LCX patent stent, OM1 20, RCA 95 ost (4.0x12 Promus DES), EF 55%;  c. 04/2012 Lexi Cardiolite  EF 48%, small area of scar @ base/mid inflat  wall with mild peri-infarct ischemia.; CABG 12/4  . Carotid artery disease ([place])    a. 87-56% LICA, 10/3327   . Chronic bronchitis ([place])   . Chronic diastolic CHF (congestive heart failure) ([place])    a. 02/2012 Echo EF 60-65%, nl wall motion, Gr 1 DD, mod MR  . Colon cancer ([place]) 1992  . Esophageal stricture   . ESRD on hemodialysis ([place])    ESRD due to HTN, started dialysis 2011 and gets HD at [facility] with Dr Hinda Lenis on MWF schedule.  Access is LUA AVF as of Sept 2014.   Marland Kitchen GERD (gastroesophageal reflux disease)   . High cholesterol 12/2011  . History of blood transfusion 07/2011; 12/2011; 01/2012 X 2; 04/2012  . History of gout   . History of lower GI bleeding   . Hypertension   .  Iron deficiency anemia   . Mitral regurgitation    a. Moderate by echo, 02/2012  . Myocardial infarction ([place])   . Ovarian cancer ([place]) 1992  . Pneumonia ~ 2009  . PUD (peptic ulcer disease)   . TIA (transient ischemic attack)     Past Surgical History:  Procedure Laterality Date  . ABDOMINAL HYSTERECTOMY  1992  . APPENDECTOMY  06/1990  . AV FISTULA PLACEMENT  07/2009   left upper arm  . AV FISTULA PLACEMENT Right 09/06/2016   Procedure: RIGHT FOREARM ARTERIOVENOUS (AV) GRAFT;  Surgeon: Elam Dutch, MD;  [facility];  Service: Vascular;  Laterality: Right;  . AV FISTULA PLACEMENT N/A 02/24/2017   Procedure: INSERTION OF ARTERIOVENOUS (AV) GORE-TEX GRAFT ARM (BRACHIAL AXILLARY);  Surgeon: Katha Cabal, MD;  Location: [facility] ORS;  Service: Vascular;  Laterality: N/A;  . [place] Right 09/06/2016   Procedure: REMOVAL OF Right Arm ARTERIOVENOUS GORETEX GRAFT and Vein Patch angioplasty of brachial artery;  Surgeon: Angelia Mould, MD;  Location: [place];  Service: Vascular;  Laterality: Right;  . COLON RESECTION  1992  . COLON SURGERY    . CORONARY ANGIOPLASTY WITH STENT PLACEMENT  12/15/11   "2"  . CORONARY ANGIOPLASTY WITH STENT PLACEMENT  y/2013   "1; makes total of 3" (05/02/2012)  .  CORONARY ARTERY BYPASS GRAFT  06/13/2012   Procedure: CORONARY ARTERY BYPASS GRAFTING (CABG);  Surgeon: Grace Isaac, MD;  Location: [place];  Service: Open Heart Surgery;  Laterality: N/A;  cabg x four;  using left internal mammary artery, and left leg greater saphenous vein harvested endoscopically  . CORONARY STENT INTERVENTION N/A 10/13/2016   Procedure: Coronary Stent Intervention;  Surgeon: Troy Sine, MD;  Location: [place] CV LAB;  Service: Cardiovascular;  Laterality: N/A;  . DIALYSIS/PERMA CATHETER REMOVAL N/A 04/18/2017   Procedure: DIALYSIS/PERMA CATHETER REMOVAL;  Surgeon: Katha Cabal, MD;  Location: [place] CV LAB;  Service: Cardiovascular;  Laterality: N/A;  . DILATION AND CURETTAGE OF UTERUS    . ESOPHAGOGASTRODUODENOSCOPY  01/20/2012   Procedure: ESOPHAGOGASTRODUODENOSCOPY (EGD);  Surgeon: Ladene Artist, MD,FACG;  Location: [facility] ENDOSCOPY;  Service: Endoscopy;  Laterality: N/A;  . ESOPHAGOGASTRODUODENOSCOPY N/A 03/26/2013   Procedure: ESOPHAGOGASTRODUODENOSCOPY (EGD);  Surgeon: Irene Shipper, MD;  Location[facility] ENDOSCOPY;  Service: Endoscopy;  Laterality: N/A;  . ESOPHAGOGASTRODUODENOSCOPY N/A 04/30/2015   Procedure: ESOPHAGOGASTRODUODENOSCOPY (EGD);  Surgeon: Rogene Houston, MD;  [facility];  Service: Endoscopy;  Laterality: N/A;  1pm - moved to 10/20 @ 1:10  . ESOPHAGOGASTRODUODENOSCOPY N/A 07/29/2016   Procedure: ESOPHAGOGASTRODUODENOSCOPY (EGD);  Surgeon: Manus Gunning, MD;  Location: [place];  Service: Gastroenterology;  Laterality: N/A;  enteroscopy  . INTRAOPERATIVE TRANSESOPHAGEAL ECHOCARDIOGRAM  06/13/2012   Procedure: INTRAOPERATIVE TRANSESOPHAGEAL ECHOCARDIOGRAM;  Surgeon: Grace Isaac, MD;  Location: [place];  Service: Open Heart Surgery;  Laterality: N/A;  . IR GENERIC HISTORICAL  07/26/2016   IR FLUORO GUIDE CV LINE RIGHT 07/26/2016 Greggory Keen, MD [facility]-INTERV RAD  . IR GENERIC HISTORICAL  07/26/2016   IR US GUIDE VASC  ACCESS RIGHT 07/26/2016 Greggory Keen, MD [facility]  . IR GENERIC HISTORICAL  08/02/2016   IR US GUIDE VASC ACCESS RIGHT 08/02/2016 Greggory Keen, MD [facility]  . IR GENERIC HISTORICAL  08/02/2016   IR FLUORO GUIDE CV LINE RIGHT 08/02/2016 Greggory Keen, MD [facility]  . IR RADIOLOGY PERIPHERAL GUIDED IV START  03/28/2017  . IR US GUIDE VASC ACCESS RIGHT  03/28/2017  . LEFT HEART CATH AND CORONARY ANGIOGRAPHY N/A 09/20/2016   Procedure: Left Heart Cath and Coronary Angiography;  Surgeon: [name], MD;  Location: [place] CV LAB;  Service: Cardiovascular;  Laterality: N/A;  . LEFT HEART CATH AND CORS/GRAFTS ANGIOGRAPHY N/A 10/13/2016   Procedure: Left Heart Cath and Cors/Grafts Angiography;  Surgeon: Troy Sine, MD;  Location: [place] CV LAB;  Service: Cardiovascular;  Laterality: N/A;  . LEFT HEART CATHETERIZATION WITH CORONARY ANGIOGRAM N/A 12/15/2011   Procedure: LEFT HEART CATHETERIZATION WITH CORONARY ANGIOGRAM;  Surgeon: Burnell Blanks, MD;  Location: [facility] CATH LAB;  Service: Cardiovascular;  Laterality: N/A;  . LEFT HEART CATHETERIZATION WITH CORONARY ANGIOGRAM N/A 01/10/2012   Procedure: LEFT HEART CATHETERIZATION WITH CORONARY ANGIOGRAM;  Surgeon: Peter M Martinique, MD;  Location: [facility] CATH LAB;  Service: Cardiovascular;  Laterality: N/A;  . LEFT HEART CATHETERIZATION WITH CORONARY ANGIOGRAM N/A 06/08/2012   Procedure: LEFT HEART CATHETERIZATION WITH CORONARY ANGIOGRAM;  Surgeon: Burnell Blanks, MD;  Location: [facility] CATH LAB;  Service: Cardiovascular;  Laterality: N/A;  . LEFT HEART CATHETERIZATION WITH CORONARY/GRAFT ANGIOGRAM N/A 12/10/2013   Procedure: LEFT HEART CATHETERIZATION WITH Beatrix Fetters;  Surgeon: [name], MD;  Location: [facility] CATH LAB;  Service: Cardiovascular;  Laterality: N/A;  . OVARY SURGERY     ovarian cancer  . REVISION OF ARTERIOVENOUS GORETEX GRAFT N/A 02/24/2017   Procedure: REVISION OF ARTERIOVENOUS GORETEX GRAFT (RESECTION);   Surgeon: Katha Cabal, MD;  Location: [facility] ORS;  Service: Vascular;  Laterality: N/A;  Earney Mallet N/A 10/15/2013   Procedure: Fistulogram;  Surgeon: Serafina Mitchell, MD;  Location: [facility] CATH LAB;  Service: Cardiovascular;  Laterality: N/A;  . THROMBECTOMY / ARTERIOVENOUS GRAFT REVISION  2011   left upper arm  . TUBAL LIGATION  1980's  . UPPER EXTREMITY ANGIOGRAPHY Bilateral 12/06/2016   Procedure: Upper Extremity Angiography;  Surgeon: Katha Cabal, MD;  Location: [place] CV LAB;  Service: Cardiovascular;  Laterality: Bilateral;    Social History   Socioeconomic History  . Marital status: Married    Spouse name: Not on file  . Number of children: Not on file  . Years of education: Not on file  . Highest education level: Not on file  Social Needs  . Financial resource strain: Not on file  . Food insecurity - worry: Not on file  . Food insecurity - inability: Not on file  . Transportation needs - medical: Not on file  . Transportation needs - non-medical: Not on file  Occupational History  . Not on file  Tobacco Use  . Smoking status: Never Smoker  . Smokeless tobacco: Never Used  Substance and Sexual Activity  . Alcohol use: No    Alcohol/week: 0.0 oz  . Drug use: No  . Sexual activity: Yes    Birth control/protection: Surgical  Other Topics Concern  . Not on file  Social History Narrative   Lives in [place], [place] with husband.  Dialysis pt - [facility].     Vitals:   06/05/17 1504  BP: (!) 148/60  Pulse: 62  SpO2: 92%  Weight: 125 lb (56.7 kg)  Height: 5\' 1"  (1.549 m)    Wt Readings from Last 3 Encounters:  06/05/17 125 lb (56.7 kg)  05/30/17 128 lb 6 oz (58.2 kg)  05/09/17 128 lb 9.6 oz (58.3 kg)     PHYSICAL EXAM General: NAD HEENT: Normal. Neck: No JVD, no thyromegaly. Lungs: Clear to auscultation bilaterally with normal respiratory  effort. CV: Regular rate and rhythm, normal S1/S2, no S3/S4, no murmur. No pretibial or periankle edema. Abdomen:  Soft, nontender, no distention.  Neurologic: Alert and oriented.  Psych: Normal affect. Skin: Normal. Musculoskeletal: No gross deformities.    ECG: Most recent ECG reviewed.   Labs: Lab Results  Component Value Date/Time   K 4.0 04/05/2017 02:58 AM   BUN 38 (H) 04/05/2017 02:58 AM   CREATININE 8.04 (H) 04/05/2017 02:58 AM   ALT 14 03/22/2017 06:02 AM   TSH 1.536 05/19/2016 11:13 AM   TSH 2.864 08/24/2014 10:00 AM   HGB 8.6 (L) 04/05/2017 02:58 AM     Lipids: Lab Results  Component Value Date/Time   LDLCALC 31 02/08/2017 12:05 PM   CHOL 106 02/08/2017 12:05 PM   TRIG 113 02/08/2017 12:05 PM   HDL 52 02/08/2017 12:05 PM       ASSESSMENT AND PLAN: 1. CAD with h/o 4-vessel CABG with recent NSTEMI and PCI of diagonal: Symptomatically stable. Coronary angiography detailed above. Continue aspirin, Coreg, nitrates, Brilinta, and simvastatin.  No longer on Lipitor.  2. Malignant HTN:  Mildly elevated.  No changes to therapy today.  3. Hyperlipidemia: Continue statin.  No longer on Lipitor.  4. Carotid artery stenosis: Stable. Continue surveillance monitoring. Continue aspirin and statin.  6. PSVT:  Stable.  Continue Coreg.      Disposition: Follow up 3-4 months   Kate Sable, M.D., F.A.C.C.

## 2017-06-06 ENCOUNTER — Encounter: Payer: Self-pay | Admitting: *Deleted

## 2017-06-06 ENCOUNTER — Encounter
Admission: RE | Disposition: A | Discharge status: Home / Self Care | Payer: Self-pay | Source: Ambulatory Visit | Attending: Vascular Surgery

## 2017-06-06 ENCOUNTER — Ambulatory Visit
Admission: RE | Admit: 2017-06-06 | Discharge: 2017-06-06 | Disposition: A | Discharge status: Home / Self Care | Payer: Medicare HMO | Source: Ambulatory Visit | Attending: Vascular Surgery | Admitting: Vascular Surgery

## 2017-06-06 DIAGNOSIS — Z880 Allergy status to penicillin: Secondary | ICD-10-CM | POA: Insufficient documentation

## 2017-06-06 DIAGNOSIS — Z85038 Personal history of other malignant neoplasm of large intestine: Secondary | ICD-10-CM | POA: Insufficient documentation

## 2017-06-06 DIAGNOSIS — N186 End stage renal disease: Secondary | ICD-10-CM | POA: Diagnosis not present

## 2017-06-06 DIAGNOSIS — Z888 Allergy status to other drugs, medicaments and biological substances status: Secondary | ICD-10-CM | POA: Insufficient documentation

## 2017-06-06 DIAGNOSIS — Z90710 Acquired absence of both cervix and uterus: Secondary | ICD-10-CM | POA: Insufficient documentation

## 2017-06-06 DIAGNOSIS — Z992 Dependence on renal dialysis: Secondary | ICD-10-CM | POA: Insufficient documentation

## 2017-06-06 DIAGNOSIS — I2510 Atherosclerotic heart disease of native coronary artery without angina pectoris: Secondary | ICD-10-CM | POA: Diagnosis not present

## 2017-06-06 DIAGNOSIS — Z91041 Radiographic dye allergy status: Secondary | ICD-10-CM | POA: Diagnosis not present

## 2017-06-06 DIAGNOSIS — Z833 Family history of diabetes mellitus: Secondary | ICD-10-CM | POA: Insufficient documentation

## 2017-06-06 DIAGNOSIS — Z886 Allergy status to analgesic agent status: Secondary | ICD-10-CM | POA: Diagnosis not present

## 2017-06-06 DIAGNOSIS — E7800 Pure hypercholesterolemia, unspecified: Secondary | ICD-10-CM | POA: Diagnosis not present

## 2017-06-06 DIAGNOSIS — Z79899 Other long term (current) drug therapy: Secondary | ICD-10-CM | POA: Diagnosis not present

## 2017-06-06 DIAGNOSIS — I6522 Occlusion and stenosis of left carotid artery: Secondary | ICD-10-CM | POA: Insufficient documentation

## 2017-06-06 DIAGNOSIS — I5032 Chronic diastolic (congestive) heart failure: Secondary | ICD-10-CM | POA: Diagnosis not present

## 2017-06-06 DIAGNOSIS — K219 Gastro-esophageal reflux disease without esophagitis: Secondary | ICD-10-CM | POA: Diagnosis not present

## 2017-06-06 DIAGNOSIS — Z8543 Personal history of malignant neoplasm of ovary: Secondary | ICD-10-CM | POA: Insufficient documentation

## 2017-06-06 DIAGNOSIS — Z8711 Personal history of peptic ulcer disease: Secondary | ICD-10-CM | POA: Insufficient documentation

## 2017-06-06 DIAGNOSIS — Z955 Presence of coronary angioplasty implant and graft: Secondary | ICD-10-CM | POA: Diagnosis not present

## 2017-06-06 DIAGNOSIS — I252 Old myocardial infarction: Secondary | ICD-10-CM | POA: Insufficient documentation

## 2017-06-06 DIAGNOSIS — Z7982 Long term (current) use of aspirin: Secondary | ICD-10-CM | POA: Diagnosis not present

## 2017-06-06 DIAGNOSIS — Z885 Allergy status to narcotic agent status: Secondary | ICD-10-CM | POA: Insufficient documentation

## 2017-06-06 DIAGNOSIS — Z8249 Family history of ischemic heart disease and other diseases of the circulatory system: Secondary | ICD-10-CM | POA: Insufficient documentation

## 2017-06-06 DIAGNOSIS — I132 Hypertensive heart and chronic kidney disease with heart failure and with stage 5 chronic kidney disease, or end stage renal disease: Secondary | ICD-10-CM | POA: Diagnosis not present

## 2017-06-06 DIAGNOSIS — G458 Other transient cerebral ischemic attacks and related syndromes: Secondary | ICD-10-CM | POA: Diagnosis not present

## 2017-06-06 DIAGNOSIS — Z951 Presence of aortocoronary bypass graft: Secondary | ICD-10-CM | POA: Diagnosis not present

## 2017-06-06 DIAGNOSIS — Y832 Surgical operation with anastomosis, bypass or graft as the cause of abnormal reaction of the patient, or of later complication, without mention of misadventure at the time of the procedure: Secondary | ICD-10-CM | POA: Diagnosis not present

## 2017-06-06 DIAGNOSIS — M1990 Unspecified osteoarthritis, unspecified site: Secondary | ICD-10-CM | POA: Diagnosis not present

## 2017-06-06 DIAGNOSIS — T82898A Other specified complication of vascular prosthetic devices, implants and grafts, initial encounter: Secondary | ICD-10-CM | POA: Insufficient documentation

## 2017-06-06 DIAGNOSIS — Z9889 Other specified postprocedural states: Secondary | ICD-10-CM | POA: Insufficient documentation

## 2017-06-06 DIAGNOSIS — Z8673 Personal history of transient ischemic attack (TIA), and cerebral infarction without residual deficits: Secondary | ICD-10-CM | POA: Diagnosis not present

## 2017-06-06 HISTORY — PX: UPPER EXTREMITY INTERVENTION: CATH118271

## 2017-06-06 LAB — POTASSIUM (ARMC VASCULAR LAB ONLY): Potassium (ARMC vascular lab): 4.5 (ref 3.5–5.1)

## 2017-06-06 SURGERY — UPPER EXTREMITY INTERVENTION
Anesthesia: Moderate Sedation | Laterality: Left | Wound class: Clean

## 2017-06-06 MED ORDER — METHYLPREDNISOLONE SODIUM SUCC 125 MG IJ SOLR
INTRAMUSCULAR | Status: AC
  Filled 2017-06-06: qty 2

## 2017-06-06 MED ORDER — FENTANYL CITRATE (PF) 100 MCG/2ML IJ SOLN
INTRAMUSCULAR | Status: AC
  Filled 2017-06-06: qty 2

## 2017-06-06 MED ORDER — MIDAZOLAM HCL 5 MG/5ML IJ SOLN
INTRAMUSCULAR | Status: AC
  Filled 2017-06-06: qty 5

## 2017-06-06 MED ORDER — METHYLPREDNISOLONE SODIUM SUCC 125 MG IJ SOLR
125.0000 mg | INTRAMUSCULAR | Status: DC | PRN
  Administered 2017-06-06: 125 mg via INTRAVENOUS

## 2017-06-06 MED ORDER — LIDOCAINE HCL (PF) 1 % IJ SOLN
INTRAMUSCULAR | Status: AC
  Filled 2017-06-06: qty 30

## 2017-06-06 MED ORDER — METHYLPREDNISOLONE SODIUM SUCC 125 MG IJ SOLR: 125.0000 mg | INTRAMUSCULAR | Status: DC | PRN

## 2017-06-06 MED ORDER — HEPARIN SODIUM (PORCINE) 1000 UNIT/ML IJ SOLN
INTRAMUSCULAR | Status: AC
  Filled 2017-06-06: qty 1

## 2017-06-06 MED ORDER — NITROGLYCERIN 5 MG/ML IV SOLN
INTRAVENOUS | Status: AC
  Filled 2017-06-06: qty 10

## 2017-06-06 MED ORDER — SODIUM CHLORIDE 0.9 % IV SOLN
INTRAVENOUS | Status: DC
  Administered 2017-06-06: 09:00:00 via INTRAVENOUS

## 2017-06-06 MED ORDER — SODIUM CHLORIDE 0.9 % IJ SOLN
INTRAMUSCULAR | Status: AC
  Filled 2017-06-06: qty 50

## 2017-06-06 MED ORDER — METHYLPREDNISOLONE SODIUM SUCC 125 MG IJ SOLR
125.0000 mg | Freq: Once | INTRAMUSCULAR | Status: AC
  Administered 2017-06-06: 125 mg via INTRAVENOUS

## 2017-06-06 MED ORDER — FAMOTIDINE IN NACL 20-0.9 MG/50ML-% IV SOLN
20.0000 mg | Freq: Once | INTRAVENOUS | Status: AC
  Administered 2017-06-06: 20 mg via INTRAVENOUS
  Filled 2017-06-06: qty 50

## 2017-06-06 MED ORDER — IOPAMIDOL (ISOVUE-300) INJECTION 61%
INTRAVENOUS | Status: DC | PRN
  Administered 2017-06-06: 75 mL via INTRA_ARTERIAL

## 2017-06-06 MED ORDER — DIPHENHYDRAMINE HCL 50 MG/ML IJ SOLN
INTRAMUSCULAR | Status: AC
  Administered 2017-06-06: 25 mg via INTRAVENOUS
  Filled 2017-06-06: qty 1

## 2017-06-06 MED ORDER — LABETALOL HCL 5 MG/ML IV SOLN: 10.0000 mg | INTRAVENOUS | Status: DC | PRN

## 2017-06-06 MED ORDER — HYDROMORPHONE HCL 1 MG/ML IJ SOLN: 0.5000 mg | INTRAMUSCULAR | Status: DC | PRN

## 2017-06-06 MED ORDER — CLINDAMYCIN PHOSPHATE 300 MG/50ML IV SOLN
INTRAVENOUS | Status: AC
  Filled 2017-06-06: qty 50

## 2017-06-06 MED ORDER — SODIUM CHLORIDE 0.9% FLUSH
3.0000 mL | Freq: Two times a day (BID) | INTRAVENOUS | Status: DC
Start: 2017-06-06 — End: 2017-06-06

## 2017-06-06 MED ORDER — FENTANYL CITRATE (PF) 100 MCG/2ML IJ SOLN
INTRAMUSCULAR | Status: DC | PRN
  Administered 2017-06-06 (×2): 25 ug via INTRAVENOUS
  Administered 2017-06-06: 50 ug via INTRAVENOUS
  Administered 2017-06-06: 25 ug via INTRAVENOUS

## 2017-06-06 MED ORDER — DIPHENHYDRAMINE HCL 50 MG/ML IJ SOLN
INTRAMUSCULAR | Status: AC
  Filled 2017-06-06: qty 1

## 2017-06-06 MED ORDER — NITROGLYCERIN 1 MG/10 ML FOR IR/CATH LAB
INTRA_ARTERIAL | Status: DC | PRN
  Administered 2017-06-06: 250 ug
  Administered 2017-06-06: 300 ug
  Administered 2017-06-06: 500 ug

## 2017-06-06 MED ORDER — SODIUM CHLORIDE 0.9 % IV BOLUS (SEPSIS)
INTRAVENOUS | Status: AC | PRN
  Administered 2017-06-06: 200 mL via INTRAVENOUS

## 2017-06-06 MED ORDER — SODIUM CHLORIDE 0.9 % IV SOLN: 250.0000 mL | INTRAVENOUS | Status: DC | PRN

## 2017-06-06 MED ORDER — DIPHENHYDRAMINE HCL 50 MG/ML IJ SOLN
25.0000 mg | Freq: Once | INTRAMUSCULAR | Status: AC
  Administered 2017-06-06: 25 mg via INTRAVENOUS

## 2017-06-06 MED ORDER — HEPARIN (PORCINE) IN NACL 2-0.9 UNIT/ML-% IJ SOLN
INTRAMUSCULAR | Status: AC
  Filled 2017-06-06: qty 1000

## 2017-06-06 MED ORDER — HYDRALAZINE HCL 20 MG/ML IJ SOLN: 5.0000 mg | INTRAMUSCULAR | Status: DC | PRN

## 2017-06-06 MED ORDER — DIPHENHYDRAMINE HCL 50 MG/ML IJ SOLN
INTRAMUSCULAR | Status: DC | PRN
  Administered 2017-06-06: 50 mg via INTRAVENOUS

## 2017-06-06 MED ORDER — CLINDAMYCIN PHOSPHATE 300 MG/50ML IV SOLN
300.0000 mg | Freq: Once | INTRAVENOUS | Status: AC
  Administered 2017-06-06: 300 mg via INTRAVENOUS

## 2017-06-06 MED ORDER — OXYCODONE HCL 5 MG PO TABS: 5.0000 mg | ORAL_TABLET | ORAL | Status: DC | PRN

## 2017-06-06 MED ORDER — HEPARIN SODIUM (PORCINE) 1000 UNIT/ML IJ SOLN
INTRAMUSCULAR | Status: DC | PRN
  Administered 2017-06-06: 4000 [IU] via INTRAVENOUS

## 2017-06-06 MED ORDER — METHYLPREDNISOLONE SODIUM SUCC 125 MG IJ SOLR
INTRAMUSCULAR | Status: AC
  Administered 2017-06-06: 125 mg via INTRAVENOUS
  Filled 2017-06-06: qty 2

## 2017-06-06 MED ORDER — HYDROMORPHONE HCL 1 MG/ML IJ SOLN: 1.0000 mg | Freq: Once | INTRAMUSCULAR | Status: DC | PRN

## 2017-06-06 MED ORDER — MIDAZOLAM HCL 2 MG/2ML IJ SOLN
INTRAMUSCULAR | Status: DC | PRN
  Administered 2017-06-06: 2 mg via INTRAVENOUS
  Administered 2017-06-06 (×2): 1 mg via INTRAVENOUS

## 2017-06-06 MED ORDER — ONDANSETRON HCL 4 MG/2ML IJ SOLN: 4.0000 mg | Freq: Four times a day (QID) | INTRAMUSCULAR | Status: DC | PRN

## 2017-06-06 MED ORDER — FAMOTIDINE 20 MG PO TABS
ORAL_TABLET | ORAL | Status: AC
  Filled 2017-06-06: qty 2

## 2017-06-06 MED ORDER — FAMOTIDINE 20 MG PO TABS
40.0000 mg | ORAL_TABLET | ORAL | Status: DC | PRN
  Administered 2017-06-06: 40 mg via ORAL

## 2017-06-06 MED ORDER — SODIUM CHLORIDE 0.9% FLUSH: 3.0000 mL | INTRAVENOUS | Status: DC | PRN

## 2017-06-06 SURGICAL SUPPLY — 32 items
BALLN ARMADA 8X40X135 (BALLOONS) ×2
BALLN LUTONIX DCB 4X40X130 (BALLOONS) ×2
BALLN LUTONIX DCB 6X40X130 (BALLOONS) ×2
BALLN LUTONIX DCB 7X40X130 (BALLOONS) ×2
BALLN ULTRVRSE 2.5X300X150 (BALLOONS) ×2
BALLN ULTRVRSE 3.5X40X150 (BALLOONS) ×2
BALLN ULTRVRSE 3X150X150 (BALLOONS) ×2
BALLOON ARMADA 8X40X135 (BALLOONS) ×1 IMPLANT
BALLOON LUTONIX DCB 4X40X130 (BALLOONS) ×1 IMPLANT
BALLOON LUTONIX DCB 6X40X130 (BALLOONS) ×1 IMPLANT
BALLOON LUTONIX DCB 7X40X130 (BALLOONS) ×1 IMPLANT
BALLOON ULTRVRSE 2.5X300X150 (BALLOONS) ×1 IMPLANT
BALLOON ULTRVRSE 3.5X40X150 (BALLOONS) ×1 IMPLANT
BALLOON ULTRVRSE 3X150X150 (BALLOONS) ×1 IMPLANT
CATH ANGIO 5F 100CM .035 PIG (CATHETERS) ×2 IMPLANT
CATH BEACON 5 .035 100 H1 TIP (CATHETERS) ×2 IMPLANT
CATH GWIRE MARINER STRGHT 4FR (CATHETERS) ×2 IMPLANT
DEVICE PRESTO INFLATION (MISCELLANEOUS) ×2 IMPLANT
DEVICE STARCLOSE SE CLOSURE (Vascular Products) ×2 IMPLANT
DEVICE TORQUE (MISCELLANEOUS) ×2 IMPLANT
GLIDEWIRE ANGLED SS 035X260CM (WIRE) ×2 IMPLANT
NEEDLE ENTRY 21GA 7CM ECHOTIP (NEEDLE) ×2 IMPLANT
PACK ANGIOGRAPHY (CUSTOM PROCEDURE TRAY) ×2 IMPLANT
SET INTRO CAPELLA COAXIAL (SET/KITS/TRAYS/PACK) ×2 IMPLANT
SHEATH BRITE TIP 5FRX11 (SHEATH) ×2 IMPLANT
SHEATH SHUTTLE SELECT 6F (SHEATH) ×2 IMPLANT
SYR MEDRAD MARK V 150ML (SYRINGE) ×2 IMPLANT
TUBING CONTRAST HIGH PRESS 72 (TUBING) ×2 IMPLANT
VALVE CHECKFLO PERFORMER (SHEATH) ×2 IMPLANT
WIRE COMMAND LT 018 300CM (WIRE) ×2 IMPLANT
WIRE J 3MM .035X145CM (WIRE) ×2 IMPLANT
WIRE MAGIC TORQUE 315CM (WIRE) ×2 IMPLANT

## 2017-06-06 NOTE — H&P (Signed)
[facility] History & Physical Update  The patient was interviewed and re-examined.  The patient's previous History and Physical has been reviewed and is unchanged.  However, there is a change in the plan of care. Given this is an exam for steal syndrome the arterial system needs to be accessed in its entirety.  We plan to proceed with left arm angiography with possible intervention from the femoral approach.  This was reviewed with the patient who agrees to proceed.   Hortencia Pilar, MD  06/06/2017, 8:58 AM

## 2017-06-06 NOTE — Progress Notes (Signed)
Patient to recovery unit after procedure, complaint of itching post procedure. Repeated pre-medications per MD order due to length of time since medications had been administered. Patient reporting itching had improved. Resting quietly, vital signs stable. Right groin and pedal pulses WNL.

## 2017-06-06 NOTE — Progress Notes (Signed)
Procedure done, vitals remained stable throughout entire procedure. Right groin starclosed @ [id number], no bleeding nor hematoma at right groin, will transfer to specials to finish recovery prior to discharge.

## 2017-06-06 NOTE — Discharge Instructions (Signed)
Moderate Conscious Sedation, Adult, Care After °These instructions provide you with information about caring for yourself after your procedure. Your health care provider may also give you more specific instructions. Your treatment has been planned according to current medical practices, but problems sometimes occur. Call your health care provider if you have any problems or questions after your procedure. °What can I expect after the procedure? °After your procedure, it is common: °· To feel sleepy for several hours. °· To feel clumsy and have poor balance for several hours. °· To have poor judgment for several hours. °· To vomit if you eat too soon. ° °Follow these instructions at home: °For at least 24 hours after the procedure: ° °· Do not: °? Participate in activities where you could fall or become injured. °? Drive. °? Use heavy machinery. °? Drink alcohol. °? Take sleeping pills or medicines that cause drowsiness. °? Make important decisions or sign legal documents. °? Take care of children on your own. °· Rest. °Eating and drinking °· Follow the diet recommended by your health care provider. °· If you vomit: °? Drink water, juice, or soup when you can drink without vomiting. °? Make sure you have little or no nausea before eating solid foods. °General instructions °· Have a responsible adult stay with you until you are awake and alert. °· Take over-the-counter and prescription medicines only as told by your health care provider. °· If you smoke, do not smoke without supervision. °· Keep all follow-up visits as told by your health care provider. This is important. °Contact a health care provider if: °· You keep feeling nauseous or you keep vomiting. °· You feel light-headed. °· You develop a rash. °· You have a fever. °Get help right away if: °· You have trouble breathing. °This information is not intended to replace advice given to you by your health care provider. Make sure you discuss any questions you have  with your health care provider. °Document Released: 04/17/2013 Document Revised: 11/30/2015 Document Reviewed: 10/17/2015 °Elsevier Interactive Patient Education © 2018 [employer] °Fistulogram, Care After °Refer to this sheet in the next few weeks. These instructions provide you with information on caring for yourself after your procedure. Your health care provider may also give you more specific instructions. Your treatment has been planned according to current medical practices, but problems sometimes occur. Call your health care provider if you have any problems or questions after your procedure. °What can I expect after the procedure? °After your procedure, it is typical to have the following: °· A small amount of discomfort in the area where the catheters were placed. °· A small amount of bruising around the fistula. °· Sleepiness and fatigue. ° °Follow these instructions at home: °· Rest at home for the day following your procedure. °· Do not drive or operate heavy machinery while taking pain medicine. °· Take medicines only as directed by your health care provider. °· Do not take baths, swim, or use a hot tub until your health care provider approves. You may shower 24 hours after the procedure or as directed by your health care provider. °· There are many different ways to close and cover an incision, including stitches, skin glue, and adhesive strips. Follow your health care provider's instructions on: °? Incision care. °? Bandage (dressing) changes and removal. °? Incision closure removal. °· Monitor your dialysis fistula carefully. °Contact a health care provider if: °· You have drainage, redness, swelling, or pain at your catheter site. °· You have   a fever. °· You have chills. °Get help right away if: °· You feel weak. °· You have trouble balancing. °· You have trouble moving your arms or legs. °· You have problems with your speech or vision. °· You can no longer feel a vibration or buzz when you put  your fingers over your dialysis fistula. °· The limb that was used for the procedure: °? Swells. °? Is painful. °? Is cold. °? Is discolored, such as blue or pale white. °This information is not intended to replace advice given to you by your health care provider. Make sure you discuss any questions you have with your health care provider. °Document Released: 11/11/2013 Document Revised: 12/03/2015 Document Reviewed: 08/16/2013 °Elsevier Interactive Patient Education © 2018 [employer] ° °

## 2017-06-06 NOTE — Progress Notes (Signed)
Patient here today for upper arm angiogram per Dr Delana Meyer, will attach to monitor and follow vitals throughout entire procedure.

## 2017-06-06 NOTE — Op Note (Signed)
[facility] Percutaneous Study/Intervention Procedural Note   Date: 05/31/2016  Surgeon(s): Hortencia Pilar, MD  Assistants: none  Pre-operative Diagnosis: 1.  Steal Syndrome secondary to AV access.  2.  End-stage renal disease on hemodialysis   Post-operative diagnosis: Same  Procedure(s) Performed: 1. Ultrasound guidance for vascular access right femoral artery femoral artery 2. Catheter placement into left brachial artery from right femoral approach 3. Aortogram and selective angiogram of the left arm third order catheter placement  4.  Percutaneous transluminal angioplasty of the left brachial artery with a 8 mm diameter x 40 mm length Lutonix angioplasty balloon 5. Percutaneous transluminal angioplasty of the left radial artery with a 3.5 mm Ultraverse balloon 6. StarClose closure device right femoral artery   Anesthesia: Conscious sedation was administered under my direct supervision by the interventional radiology RN.  IV Versed plus fentanyl were utilized. Continuous ECG, pulse oximetry and blood pressure was monitored throughout the entire procedure.  Versed and fentanyl were administered intravenously.  Conscious sedation was administered for a total of 76 minutes.   Contrast: 75 cc  Fluoro time: 8.7   Indications: Patient is a 76y.o. woman who has symptoms consistent with left upper extremity ischemia while on dialysis. The patient has a duplex ultrasound showing reversal of flow in the left ulnar artery consistent with steal syndrome.  The patient is brought in for angiography for further evaluation and potential treatment. Risks and benefits are discussed and informed consent is obtained  Procedure: The patient was identified and appropriate procedural time out was performed. The patient was then placed supine on the table and prepped and draped in the usual sterile  fashion. Ultrasound was used to evaluate the right common femoral artery. It was patent. A digital ultrasound image was acquired. A micropuncture needle was used to access the right common femoral artery under direct ultrasound guidance and a permanent image was performed. A microwire was then advanced easily under fluoroscopic guidance and a micro-sheath inserted. A 0.035 J wire was advanced without resistance and a 5Fr sheath was placed. Pigtail catheter was placed into the ascending aorta and an LAO projection of the arch was performed. The innominate and left carotid were widely patent and the arch was otherwise normal.   The patient was given 4000 units of IV heparin.We upsized to a 6 Fr sheath 90 cm shuttle sheath.  An H1 catheter was used to selectively cannulate the subclavian and the catheter was advanced initially to the mid subclavian where hand injection contrast was used to demonstrate the distal subclavian and axillary arteries and then the catheter was advanced over the wire into the brachial artery were distal runoff was complete.  Ultimately, the wire catheter combination was advanced in the radial artery selected.  This imaging demonstrated a greater than 90% stenosis of the left radial artery and an irregularity at the anastomosis in the brachial artery that appeared to be greater than 65-70%.  This irregularity of the brachial artery was associated with the proximal anastomosis.   Based on her symptoms and these findings, I elected to treat the left radial and brachial arteries.   I crossed the lesion without difficulty with a Glidewire and the 150 straight catheter and exchanged the Glidewire for a 0.018 command wire. Magnified imaging of the lesion were then obtained by hand injection through the sheath. I used a 2.5 mm diameter x 30 cm length angioplasty balloon and inflated the balloon to 10 atm for one minute.  Completion angiogram demonstrated  moderate improvement but the area  was still undersized, so I elected to proceed a 3.0 mm x 30 cm Ultraverse balloon again inflated to 10 atm for a minute.  This demonstrated a persistent ostial lesion of the radial artery and this was treated with a 3.5 mm balloon and subsequently a 4 mm Lutonix balloon inflated to 4 atm.  Follow-up imaging with direct injection demonstrated less than 15% residual stenosis.  The detector was then repositioned and initially a 6 mm Lutonix balloon and then a 7 mm Lutonix balloon and ultimately an 8 mm Ultraverse balloon was used to treat the perianastomotic area of the brachial artery.  Inflations were to 10 atm for 1 minute.  Follow-up imaging demonstrated resolution of the stenosis noted on the initial imaging.  It should be noted that throughout the course of this procedure intra-arterial nitroglycerin was administered in aliquots ranging from 250 mcg to 500 mcg.   On completion angiogram following this, less than 10 % residual stenosis was identified however, there is still minimal contrast flowing to the hand with injection at the level of the mid brachial artery.  Also of note the AV fistula has 2 areas which already appear to be banded and are narrowed by greater than 50%.   At this point, I elected to terminate the procedure. The diagnostic catheter was removed. StarClose closure device was deployed in usual fashion with excellent hemostatic result. The patient was taken to the recovery room in stable condition having tolerated the procedure well.     Findings:Aortic arch demonstrates a type I arch. No evidence of ulcerations or other abnormalities of the arch. Great vessels are widely patent in their origins and visualized segments with the including the left subclavian.  The brachial artery is generous in size does demonstrate some irregularities in its midportion that appeared to be consistent with FMD it also demonstrates some tortuosity but they do not appear to be flow limiting stenoses  and associated with these areas.  At the level of the anastomosis as noted above there is a stricture.  There is very poor flow distally however on selective injection there is a subtotal occlusion of the radial artery the ulnar artery is patent as is the palmar arch and digital vessels are seen to all 5 fingers.  Following angioplasty of the radial artery direct injection demonstrates wide patency of the radial artery.  Following Angie plasty of the brachial artery at the anastomosis demonstrates resolution of his previously noted stricture.  However, injection of contrast at the mid brachial level still does not yield significant flow toward the hand.  Given the appearance of the AV fistula which already appears to have 2 locations of greater than 50% stenosis further banding may result in loss of this access.  At this point I have discussed with the husband that I would like for her to continue dialysis.  Should the treatments today have improved her enough that she remains pain-free during dialysis then no further surgery is warranted.  Should she continue to have pain in her hand with each dialysis session then banding of the fistula may be attempted but I have already estimated a 50% chance that we will lose the AV access if we are forced to do this.  Patient will follow up with me in the office in 2 weeks.   Disposition: Patient is status post successful intervention left arm. Patient was taken to the recovery room in stable condition having tolerated the procedure well.  Complications: None  Hortencia Pilar 05/31/2016 10:45 AM  This note was created with Dragon Medical transcription system. Any errors in dictation are purely unintentional.

## 2017-06-07 ENCOUNTER — Encounter: Payer: Self-pay | Admitting: Vascular Surgery

## 2017-06-07 DIAGNOSIS — Z992 Dependence on renal dialysis: Secondary | ICD-10-CM | POA: Diagnosis not present

## 2017-06-07 DIAGNOSIS — N186 End stage renal disease: Secondary | ICD-10-CM | POA: Diagnosis not present

## 2017-06-08 ENCOUNTER — Encounter: Payer: Self-pay | Admitting: Vascular Surgery

## 2017-06-08 DIAGNOSIS — N186 End stage renal disease: Secondary | ICD-10-CM | POA: Diagnosis not present

## 2017-06-08 DIAGNOSIS — I120 Hypertensive chronic kidney disease with stage 5 chronic kidney disease or end stage renal disease: Secondary | ICD-10-CM | POA: Diagnosis not present

## 2017-06-08 DIAGNOSIS — E785 Hyperlipidemia, unspecified: Secondary | ICD-10-CM | POA: Diagnosis not present

## 2017-06-08 DIAGNOSIS — Z992 Dependence on renal dialysis: Secondary | ICD-10-CM | POA: Diagnosis not present

## 2017-06-08 DIAGNOSIS — K5732 Diverticulitis of large intestine without perforation or abscess without bleeding: Secondary | ICD-10-CM | POA: Diagnosis not present

## 2017-06-08 DIAGNOSIS — I480 Paroxysmal atrial fibrillation: Secondary | ICD-10-CM | POA: Diagnosis not present

## 2017-06-08 DIAGNOSIS — I2510 Atherosclerotic heart disease of native coronary artery without angina pectoris: Secondary | ICD-10-CM | POA: Diagnosis not present

## 2017-06-08 DIAGNOSIS — Z7982 Long term (current) use of aspirin: Secondary | ICD-10-CM | POA: Diagnosis not present

## 2017-06-09 DIAGNOSIS — N186 End stage renal disease: Secondary | ICD-10-CM | POA: Diagnosis not present

## 2017-06-09 DIAGNOSIS — Z992 Dependence on renal dialysis: Secondary | ICD-10-CM | POA: Diagnosis not present

## 2017-06-10 DIAGNOSIS — Z992 Dependence on renal dialysis: Secondary | ICD-10-CM | POA: Diagnosis not present

## 2017-06-10 DIAGNOSIS — E8770 Fluid overload, unspecified: Secondary | ICD-10-CM | POA: Diagnosis not present

## 2017-06-10 DIAGNOSIS — N186 End stage renal disease: Secondary | ICD-10-CM | POA: Diagnosis not present

## 2017-06-11 IMAGING — US US ABDOMEN LIMITED
1 series · 14 of 25 positions shown · non-contrast
Comparison: Ultrasound [DATE], CT [DATE]

CLINICAL DATA: Right upper quadrant pain, onset yesterday.

EXAM:
US ABDOMEN LIMITED - RIGHT UPPER QUADRANT

[Series 1: us abdomen limited · 0.15mm/px · 14 of 47 slices shown]
[im 1/47]
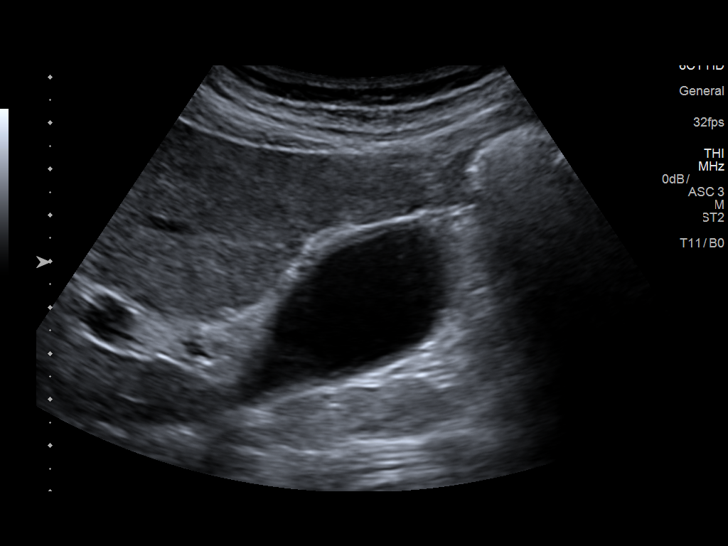
[im 4/47]
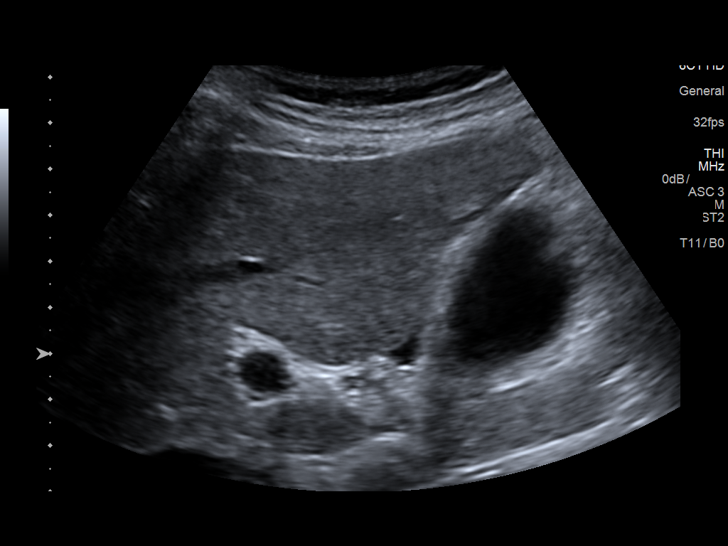
[im 8/47]
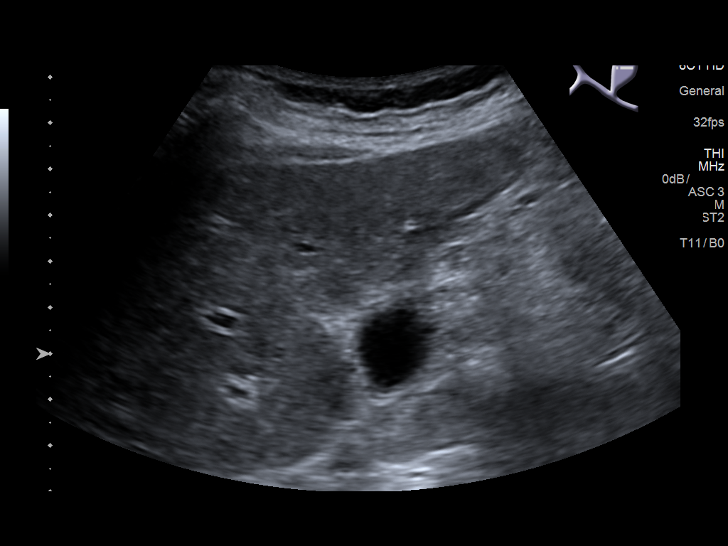
[im 12/47]
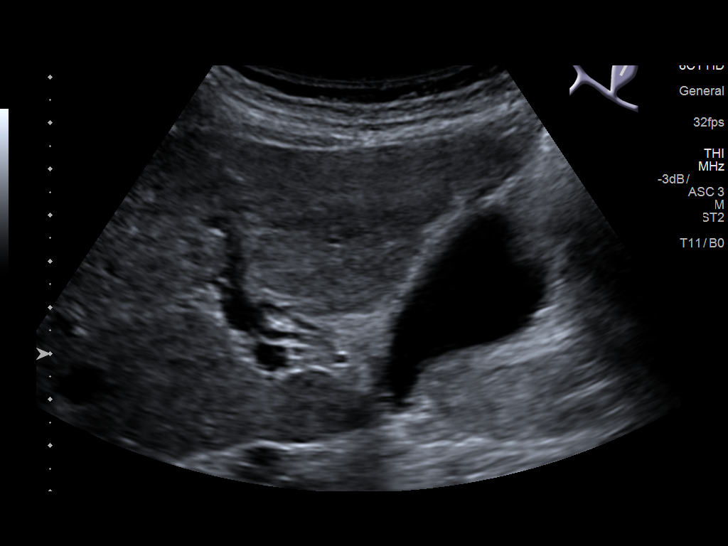
[im 16/47]
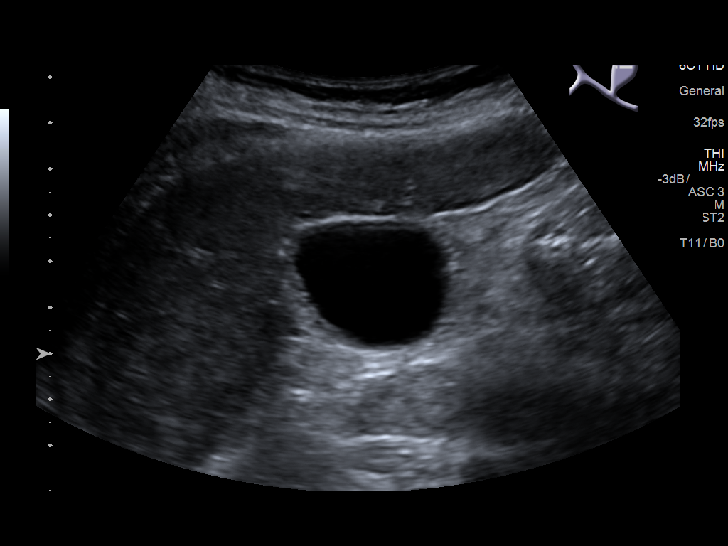
[im 18/47]
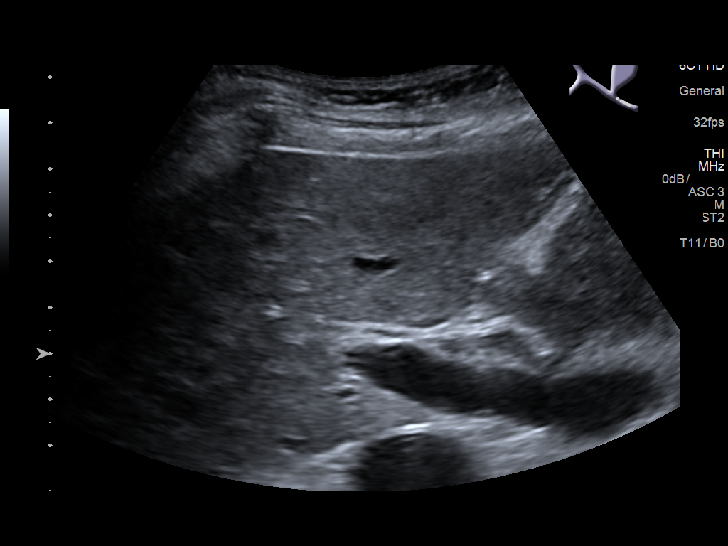
[im 22/47]
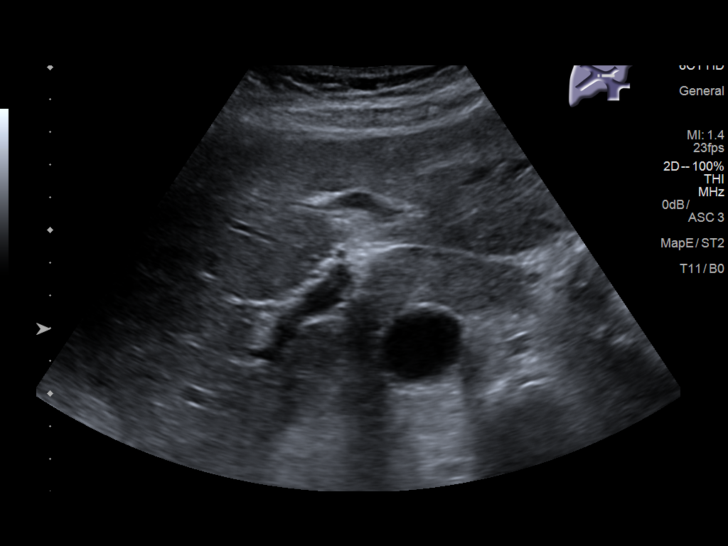
[im 25/47]
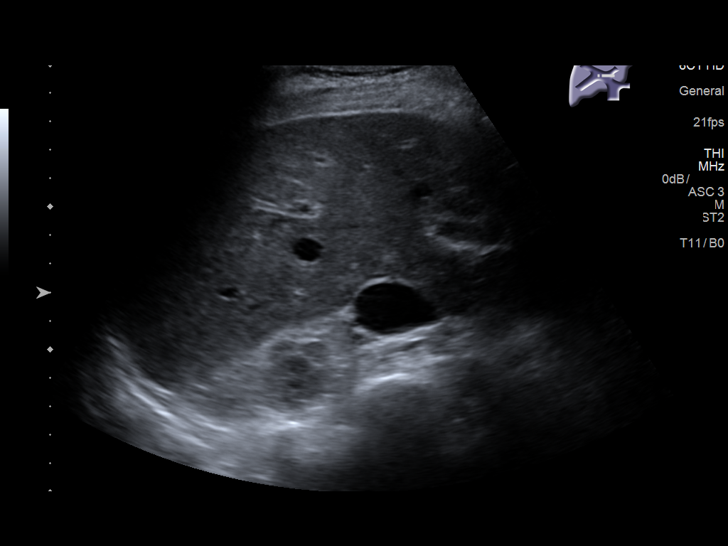
[im 29/47]
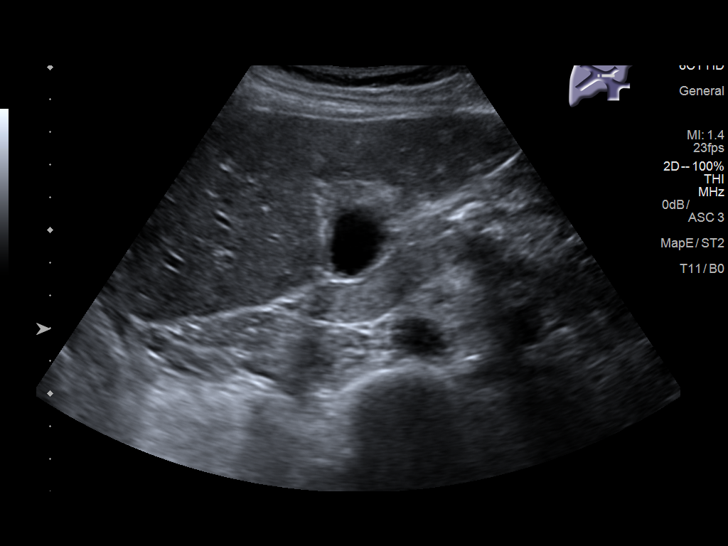
[im 31/47]
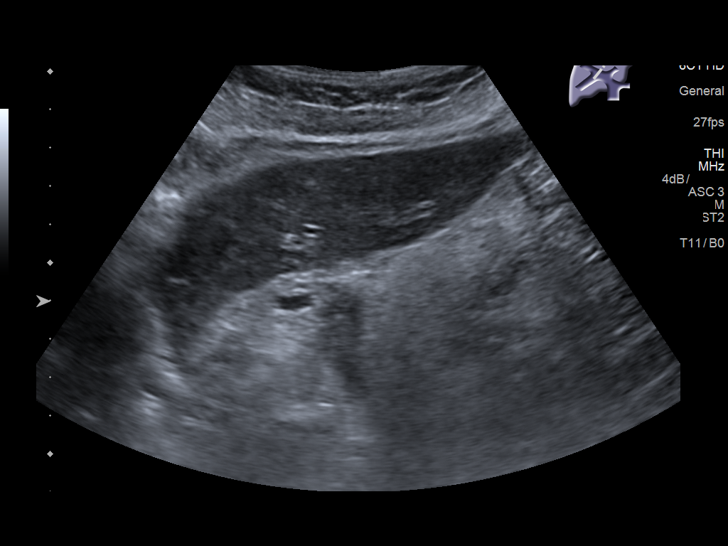
[im 35/47]
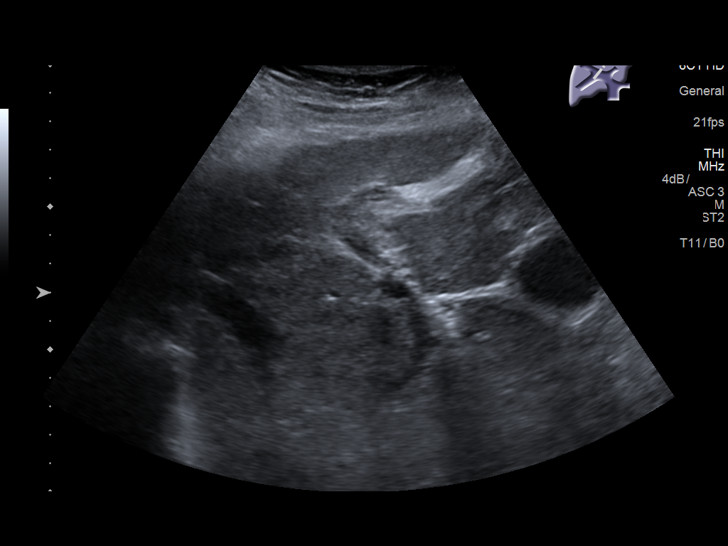
[im 39/47]
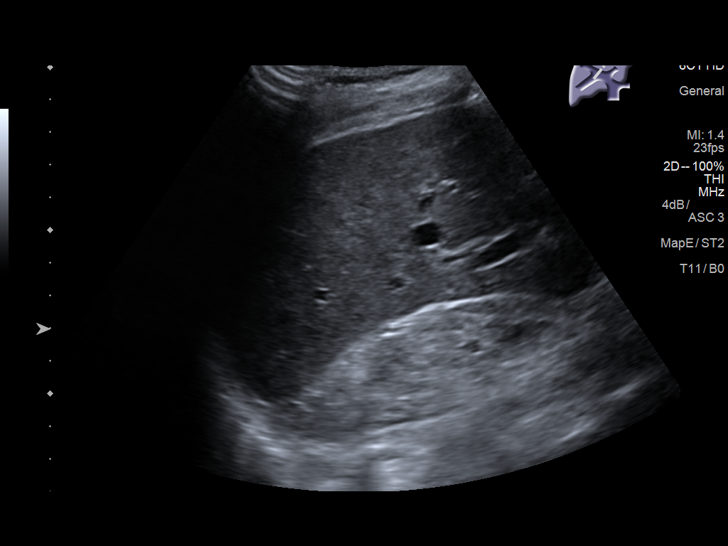
[im 43/47]
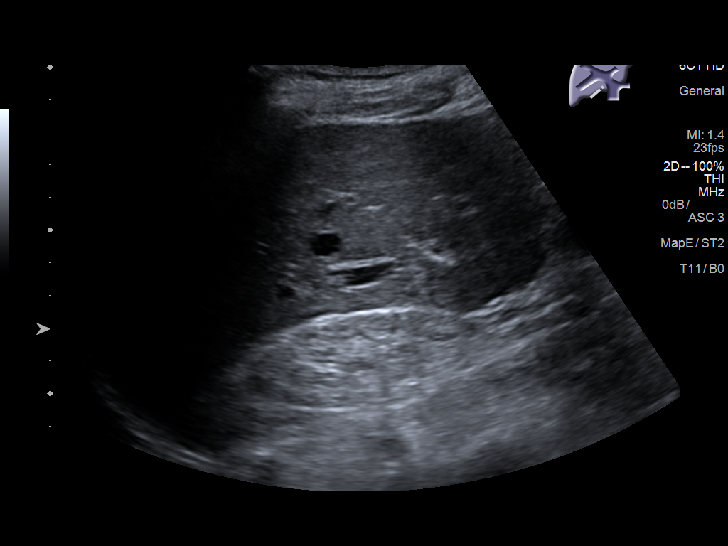
[im 47/47]
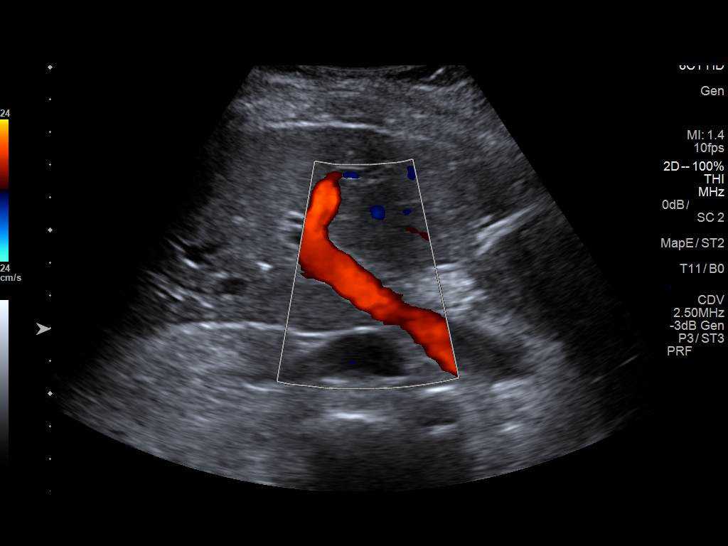

[14 of 25 positions shown; findings below may reference images not displayed]

FINDINGS: Gallbladder:

Physiologically distended. No gallstones. Mild gallbladder wall
thickening measuring 3-4 mm. Trace pericholecystic fluid. No
sonographic Murphy sign noted by sonographer.

Common bile duct:

Diameter: 2 mm.

Liver:

No focal lesion identified. Within normal limits in parenchymal
echogenicity. Normal directional flow in the main portal vein.

Incidental note of increased right renal echogenicity and right
renal cyst.
IMPRESSION: 1. Borderline gallbladder wall thickening with trace pericholecystic
fluid, nonspecific. This can be seen in the setting of systemic
causes such as liver disease or cardiac failure, or primary
gallbladder inflammation. No gallstones or sonographic Murphy sign.
2. No biliary dilatation. Unremarkable sonographic appearance of the
liver.

## 2017-06-12 DIAGNOSIS — E8770 Fluid overload, unspecified: Secondary | ICD-10-CM | POA: Diagnosis not present

## 2017-06-12 DIAGNOSIS — Z992 Dependence on renal dialysis: Secondary | ICD-10-CM | POA: Diagnosis not present

## 2017-06-12 DIAGNOSIS — N186 End stage renal disease: Secondary | ICD-10-CM | POA: Diagnosis not present

## 2017-06-12 DIAGNOSIS — T82898A Other specified complication of vascular prosthetic devices, implants and grafts, initial encounter: Secondary | ICD-10-CM | POA: Diagnosis not present

## 2017-06-14 DIAGNOSIS — T82898A Other specified complication of vascular prosthetic devices, implants and grafts, initial encounter: Secondary | ICD-10-CM | POA: Diagnosis not present

## 2017-06-14 DIAGNOSIS — E8770 Fluid overload, unspecified: Secondary | ICD-10-CM | POA: Diagnosis not present

## 2017-06-14 DIAGNOSIS — N186 End stage renal disease: Secondary | ICD-10-CM | POA: Diagnosis not present

## 2017-06-14 DIAGNOSIS — Z992 Dependence on renal dialysis: Secondary | ICD-10-CM | POA: Diagnosis not present

## 2017-06-15 ENCOUNTER — Other Ambulatory Visit: Payer: Self-pay | Admitting: Cardiovascular Disease

## 2017-06-15 DIAGNOSIS — N185 Chronic kidney disease, stage 5: Secondary | ICD-10-CM | POA: Diagnosis not present

## 2017-06-15 DIAGNOSIS — Z992 Dependence on renal dialysis: Secondary | ICD-10-CM | POA: Diagnosis not present

## 2017-06-15 DIAGNOSIS — N186 End stage renal disease: Secondary | ICD-10-CM | POA: Diagnosis not present

## 2017-06-15 DIAGNOSIS — J0101 Acute recurrent maxillary sinusitis: Secondary | ICD-10-CM | POA: Diagnosis not present

## 2017-06-15 DIAGNOSIS — Z6824 Body mass index (BMI) 24.0-24.9, adult: Secondary | ICD-10-CM | POA: Diagnosis not present

## 2017-06-16 DIAGNOSIS — Z992 Dependence on renal dialysis: Secondary | ICD-10-CM | POA: Diagnosis not present

## 2017-06-16 DIAGNOSIS — T82898A Other specified complication of vascular prosthetic devices, implants and grafts, initial encounter: Secondary | ICD-10-CM | POA: Diagnosis not present

## 2017-06-16 DIAGNOSIS — E8770 Fluid overload, unspecified: Secondary | ICD-10-CM | POA: Diagnosis not present

## 2017-06-16 DIAGNOSIS — N186 End stage renal disease: Secondary | ICD-10-CM | POA: Diagnosis not present

## 2017-06-20 DIAGNOSIS — N186 End stage renal disease: Secondary | ICD-10-CM | POA: Diagnosis not present

## 2017-06-20 DIAGNOSIS — E8770 Fluid overload, unspecified: Secondary | ICD-10-CM | POA: Diagnosis not present

## 2017-06-20 DIAGNOSIS — T82898A Other specified complication of vascular prosthetic devices, implants and grafts, initial encounter: Secondary | ICD-10-CM | POA: Diagnosis not present

## 2017-06-20 DIAGNOSIS — Z992 Dependence on renal dialysis: Secondary | ICD-10-CM | POA: Diagnosis not present

## 2017-06-21 DIAGNOSIS — T82898A Other specified complication of vascular prosthetic devices, implants and grafts, initial encounter: Secondary | ICD-10-CM | POA: Diagnosis not present

## 2017-06-21 DIAGNOSIS — Z992 Dependence on renal dialysis: Secondary | ICD-10-CM | POA: Diagnosis not present

## 2017-06-21 DIAGNOSIS — E8770 Fluid overload, unspecified: Secondary | ICD-10-CM | POA: Diagnosis not present

## 2017-06-21 DIAGNOSIS — N186 End stage renal disease: Secondary | ICD-10-CM | POA: Diagnosis not present

## 2017-06-22 DIAGNOSIS — N186 End stage renal disease: Secondary | ICD-10-CM | POA: Diagnosis not present

## 2017-06-22 DIAGNOSIS — E8770 Fluid overload, unspecified: Secondary | ICD-10-CM | POA: Diagnosis not present

## 2017-06-22 DIAGNOSIS — Z992 Dependence on renal dialysis: Secondary | ICD-10-CM | POA: Diagnosis not present

## 2017-06-23 DIAGNOSIS — N186 End stage renal disease: Secondary | ICD-10-CM | POA: Diagnosis not present

## 2017-06-23 DIAGNOSIS — Z992 Dependence on renal dialysis: Secondary | ICD-10-CM | POA: Diagnosis not present

## 2017-06-23 DIAGNOSIS — T82898A Other specified complication of vascular prosthetic devices, implants and grafts, initial encounter: Secondary | ICD-10-CM | POA: Diagnosis not present

## 2017-06-23 DIAGNOSIS — E8770 Fluid overload, unspecified: Secondary | ICD-10-CM | POA: Diagnosis not present

## 2017-06-24 DIAGNOSIS — E8770 Fluid overload, unspecified: Secondary | ICD-10-CM | POA: Diagnosis not present

## 2017-06-24 DIAGNOSIS — N186 End stage renal disease: Secondary | ICD-10-CM | POA: Diagnosis not present

## 2017-06-24 DIAGNOSIS — T82898A Other specified complication of vascular prosthetic devices, implants and grafts, initial encounter: Secondary | ICD-10-CM | POA: Diagnosis not present

## 2017-06-24 DIAGNOSIS — Z992 Dependence on renal dialysis: Secondary | ICD-10-CM | POA: Diagnosis not present

## 2017-06-26 DIAGNOSIS — T82898A Other specified complication of vascular prosthetic devices, implants and grafts, initial encounter: Secondary | ICD-10-CM | POA: Diagnosis not present

## 2017-06-26 DIAGNOSIS — E8770 Fluid overload, unspecified: Secondary | ICD-10-CM | POA: Diagnosis not present

## 2017-06-26 DIAGNOSIS — Z992 Dependence on renal dialysis: Secondary | ICD-10-CM | POA: Diagnosis not present

## 2017-06-26 DIAGNOSIS — N186 End stage renal disease: Secondary | ICD-10-CM | POA: Diagnosis not present

## 2017-06-28 DIAGNOSIS — T82898A Other specified complication of vascular prosthetic devices, implants and grafts, initial encounter: Secondary | ICD-10-CM | POA: Diagnosis not present

## 2017-06-28 DIAGNOSIS — Z992 Dependence on renal dialysis: Secondary | ICD-10-CM | POA: Diagnosis not present

## 2017-06-28 DIAGNOSIS — E8770 Fluid overload, unspecified: Secondary | ICD-10-CM | POA: Diagnosis not present

## 2017-06-28 DIAGNOSIS — N186 End stage renal disease: Secondary | ICD-10-CM | POA: Diagnosis not present

## 2017-06-30 ENCOUNTER — Other Ambulatory Visit (INDEPENDENT_AMBULATORY_CARE_PROVIDER_SITE_OTHER): Payer: Self-pay | Admitting: Vascular Surgery

## 2017-06-30 DIAGNOSIS — E8770 Fluid overload, unspecified: Secondary | ICD-10-CM | POA: Diagnosis not present

## 2017-06-30 DIAGNOSIS — Z992 Dependence on renal dialysis: Secondary | ICD-10-CM | POA: Diagnosis not present

## 2017-06-30 DIAGNOSIS — N186 End stage renal disease: Secondary | ICD-10-CM | POA: Diagnosis not present

## 2017-06-30 DIAGNOSIS — N185 Chronic kidney disease, stage 5: Secondary | ICD-10-CM

## 2017-06-30 DIAGNOSIS — T82898A Other specified complication of vascular prosthetic devices, implants and grafts, initial encounter: Secondary | ICD-10-CM | POA: Diagnosis not present

## 2017-07-01 ENCOUNTER — Emergency Department (HOSPITAL_COMMUNITY): Payer: Medicare HMO

## 2017-07-01 ENCOUNTER — Other Ambulatory Visit: Payer: Self-pay

## 2017-07-01 ENCOUNTER — Emergency Department (HOSPITAL_COMMUNITY)
Admission: EM | Admit: 2017-07-01 | Discharge: 2017-07-01 | Disposition: A | Discharge status: Home / Self Care | Payer: Medicare HMO | Attending: Emergency Medicine | Admitting: Emergency Medicine

## 2017-07-01 ENCOUNTER — Encounter (HOSPITAL_COMMUNITY): Payer: Self-pay

## 2017-07-01 DIAGNOSIS — I132 Hypertensive heart and chronic kidney disease with heart failure and with stage 5 chronic kidney disease, or end stage renal disease: Secondary | ICD-10-CM | POA: Diagnosis not present

## 2017-07-01 DIAGNOSIS — Z955 Presence of coronary angioplasty implant and graft: Secondary | ICD-10-CM | POA: Diagnosis not present

## 2017-07-01 DIAGNOSIS — I2510 Atherosclerotic heart disease of native coronary artery without angina pectoris: Secondary | ICD-10-CM | POA: Insufficient documentation

## 2017-07-01 DIAGNOSIS — R079 Chest pain, unspecified: Secondary | ICD-10-CM

## 2017-07-01 DIAGNOSIS — Z992 Dependence on renal dialysis: Secondary | ICD-10-CM | POA: Insufficient documentation

## 2017-07-01 DIAGNOSIS — F419 Anxiety disorder, unspecified: Secondary | ICD-10-CM | POA: Diagnosis not present

## 2017-07-01 DIAGNOSIS — N186 End stage renal disease: Secondary | ICD-10-CM | POA: Insufficient documentation

## 2017-07-01 DIAGNOSIS — Z8543 Personal history of malignant neoplasm of ovary: Secondary | ICD-10-CM | POA: Diagnosis not present

## 2017-07-01 DIAGNOSIS — Z8673 Personal history of transient ischemic attack (TIA), and cerebral infarction without residual deficits: Secondary | ICD-10-CM | POA: Diagnosis not present

## 2017-07-01 DIAGNOSIS — Z7982 Long term (current) use of aspirin: Secondary | ICD-10-CM | POA: Insufficient documentation

## 2017-07-01 DIAGNOSIS — R69 Illness, unspecified: Secondary | ICD-10-CM | POA: Diagnosis not present

## 2017-07-01 DIAGNOSIS — Z79899 Other long term (current) drug therapy: Secondary | ICD-10-CM | POA: Insufficient documentation

## 2017-07-01 DIAGNOSIS — Z85038 Personal history of other malignant neoplasm of large intestine: Secondary | ICD-10-CM | POA: Diagnosis not present

## 2017-07-01 DIAGNOSIS — I5032 Chronic diastolic (congestive) heart failure: Secondary | ICD-10-CM | POA: Insufficient documentation

## 2017-07-01 DIAGNOSIS — I252 Old myocardial infarction: Secondary | ICD-10-CM | POA: Insufficient documentation

## 2017-07-01 DIAGNOSIS — R0789 Other chest pain: Secondary | ICD-10-CM | POA: Diagnosis not present

## 2017-07-01 LAB — COMPREHENSIVE METABOLIC PANEL
ALK PHOS: 58 U/L (ref 38–126)
ALT: 16 U/L (ref 14–54)
AST: 20 U/L (ref 15–41)
Albumin: 3.4 g/dL — ABNORMAL LOW (ref 3.5–5.0)
Anion gap: 11 (ref 5–15)
BILIRUBIN TOTAL: 0.7 mg/dL (ref 0.3–1.2)
BUN: 45 mg/dL — ABNORMAL HIGH (ref 6–20)
CALCIUM: 9 mg/dL (ref 8.9–10.3)
CO2: 26 mmol/L (ref 22–32)
Chloride: 100 mmol/L — ABNORMAL LOW (ref 101–111)
Creatinine, Ser: 6.6 mg/dL — ABNORMAL HIGH (ref 0.44–1.00)
GFR calc non Af Amer: 5 mL/min — ABNORMAL LOW (ref >60)
GFR, EST AFRICAN AMERICAN: 6 mL/min — AB (ref >60)
Glucose, Bld: 90 mg/dL (ref 65–99)
POTASSIUM: 4.5 mmol/L (ref 3.5–5.1)
SODIUM: 137 mmol/L (ref 135–145)
TOTAL PROTEIN: 6.3 g/dL — AB (ref 6.5–8.1)

## 2017-07-01 LAB — CBC
HCT: 33.6 % — ABNORMAL LOW (ref 36.0–46.0)
Hemoglobin: 10.6 g/dL — ABNORMAL LOW (ref 12.0–15.0)
MCH: 32.3 pg (ref 26.0–34.0)
MCHC: 31.5 g/dL (ref 30.0–36.0)
MCV: 102.4 fL — AB (ref 78.0–100.0)
PLATELETS: 213 10*3/uL (ref 150–400)
RBC: 3.28 MIL/uL — AB (ref 3.87–5.11)
RDW: 16.1 % — AB (ref 11.5–15.5)
WBC: 6.8 10*3/uL (ref 4.0–10.5)

## 2017-07-01 LAB — I-STAT TROPONIN, ED: TROPONIN I, POC: 0.02 ng/mL (ref 0.00–0.08)

## 2017-07-01 LAB — TROPONIN I: Troponin I: 0.04 ng/mL (ref <0.03)

## 2017-07-01 IMAGING — DX DG CHEST 1V PORT
1 series · 1 of 1 positions shown · non-contrast
Comparison: [DATE]

CLINICAL DATA: Right side chest pain

EXAM:
PORTABLE CHEST 1 VIEW

[chest]
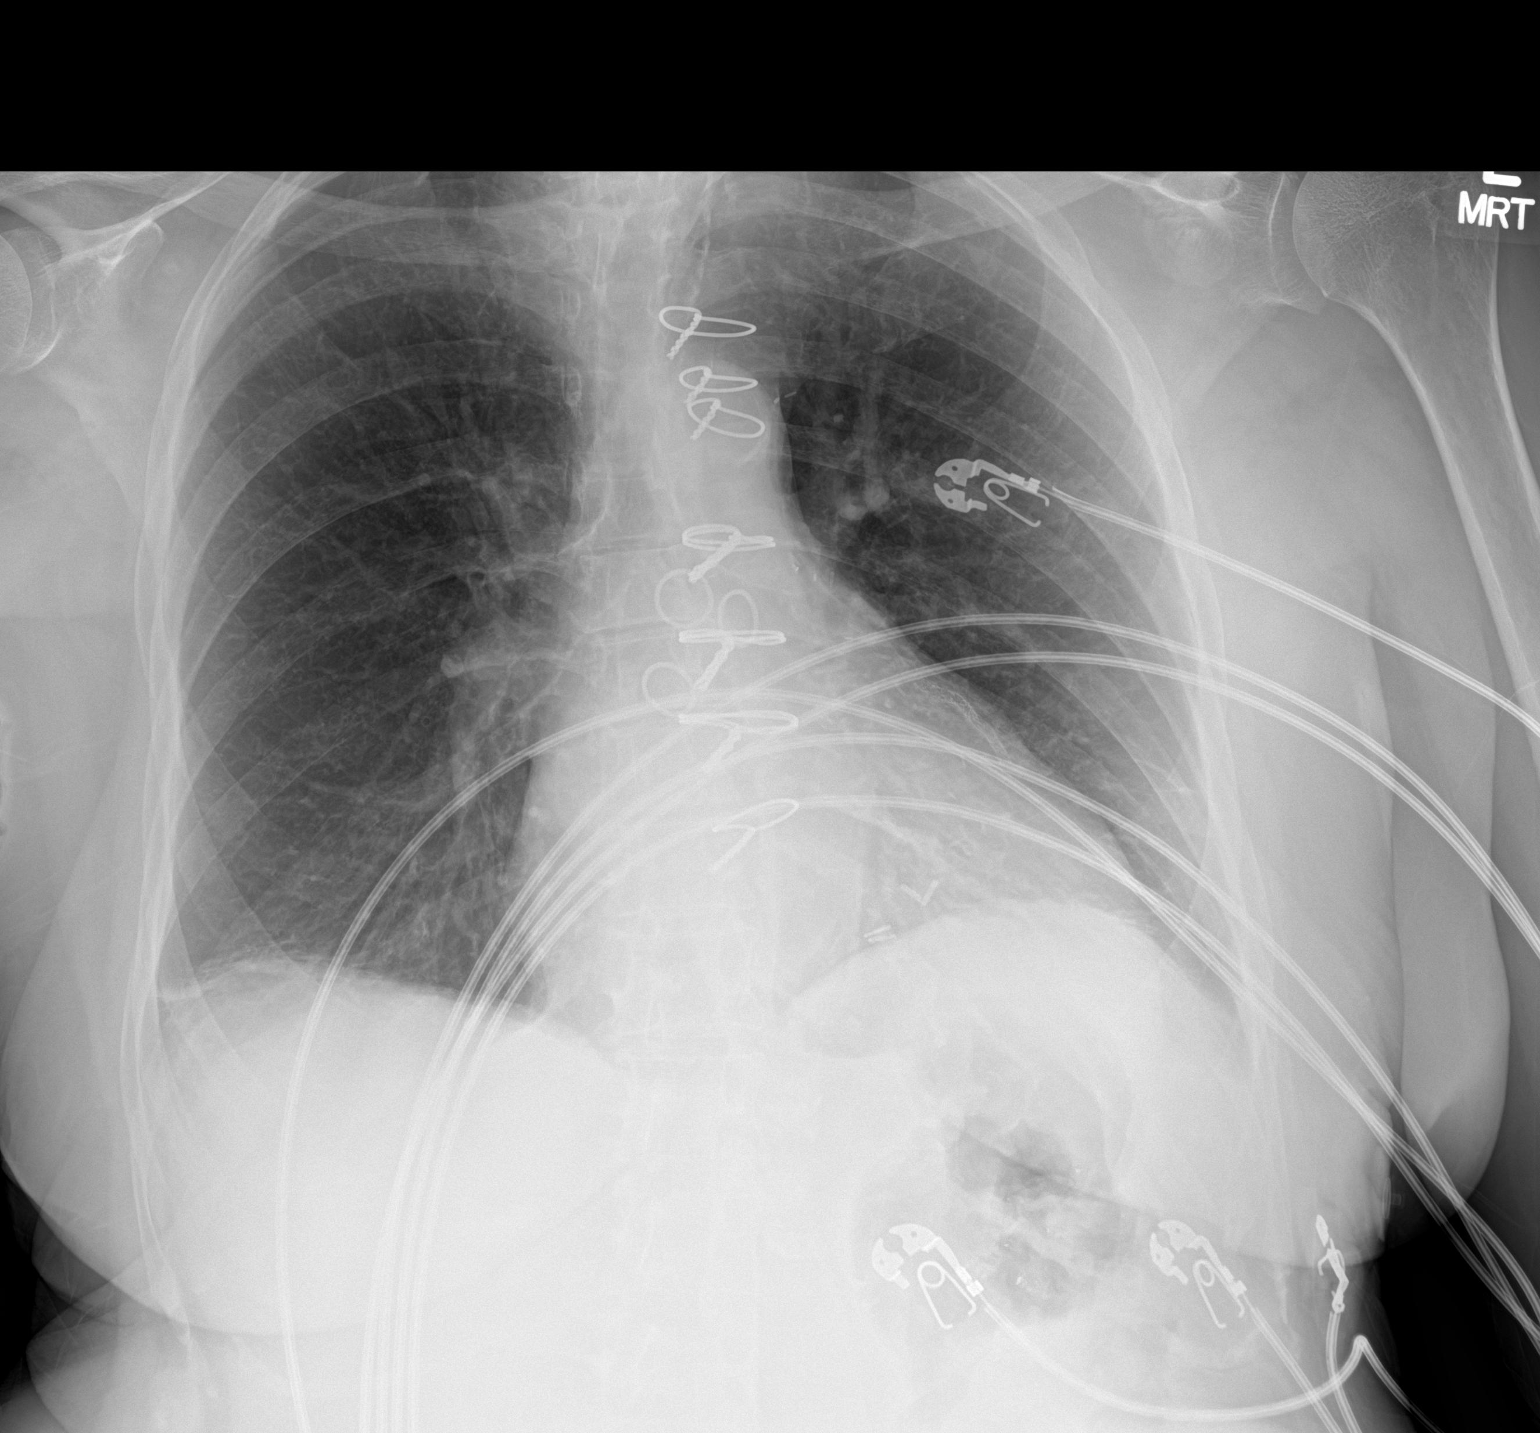

[1 of 1 positions shown; findings below may reference images not displayed]

FINDINGS: Interval removal of right dialysis catheter. Prior CABG. Bibasilar
atelectasis or scarring. Heart is upper limits normal in size. No
effusions or acute bony abnormality.
IMPRESSION: Bibasilar atelectasis or scarring.

## 2017-07-01 NOTE — Consult Note (Signed)
CARDIOLOGY CONSULT NOTE   Referring Physician: Dr. Reather Converse Primary Cardiologist: Dr. Bronson Ing Reason for Consultation: Chest pain  HPI:  Patient is a 76 y/o F with prior hx of CAD with recent diagonal intervention and DES stent placement comes in today complaining of chest pain. She claims the pain was across her chest, pressure like and radiated to her back. Pain occurred at 5 am and started after she rolled on her L shoulder to get up to go to the bathroom. Pain lasted 35min and resolved after self administration of sublingual NTG. Patient then came to the ER to see if she requires any further evaluation. On arrival she denied any chest pain or shortness of breath. She also denies any palpitations or syncope. She told me that she take the NTG 1 time every few weeks and since the stent placement in April of 2018 shes been doing well. She is anxious to be d/c home, stating that she feels fine and will f/u with Dr. Lindon Romp.   Patient has a pmhx of ESRD on dialysis, TIA, Hx of Ovarian cancer, NSTEMI in April of 2018 s/p intervention on an occluded diagonal vessel with patient LIMA to LAD, SVG to ramus and SVD to RCA.  The native vessels have significant disease.   Review of Systems:     Cardiac Review of Systems: {Y] = yes [ ]  = no  Chest Pain [ x   ]  Resting SOB [   ] Exertional SOB  [  ]  Orthopnea [  ]   Pedal Edema [   ]    Palpitations [  ] Syncope  [  ]   Presyncope [   ]  General Review of Systems: [Y] = yes [  ]=no Constitional: recent weight change [  ]; anorexia [  ]; fatigue [  ]; nausea [  ]; night sweats [  ]; fever [  ]; or chills [  ];                                                                     Eyes : blurred vision [  ]; diplopia [   ]; vision changes [  ];  Amaurosis fugax[  ]; Resp: cough [  ];  wheezing[  ];  hemoptysis[  ];  PND [  ];  GI:  gallstones[  ], vomiting[  ];  dysphagia[  ]; melena[  ];  hematochezia [  ]; heartburn[  ];   GU: kidney stones [  ];  hematuria[  ];   dysuria [  ];  nocturia[  ]; incontinence [  ];             Skin: rash, swelling[  ];, hair loss[  ];  peripheral edema[  ];  or itching[  ]; Musculosketetal: myalgias[  ];  joint swelling[  ];  joint erythema[  ];  joint pain[  ];  back pain[  ];  Heme/Lymph: bruising[  ];  bleeding[  ];  anemia[  ];  Neuro: TIA[  ];  headaches[  ];  stroke[  ];  vertigo[  ];  seizures[  ];   paresthesias[  ];  difficulty walking[  ];  Psych:depression[  ]; anxiety[  ];  Endocrine: diabetes[  ];  thyroid dysfunction[  ];  Other:  Past Medical History:  Diagnosis Date  . Acute on chronic respiratory failure with hypoxia ([place]) 10/10/2016  . Anxiety   . Arthritis   . AVM (arteriovenous malformation) of colon   . CAD (coronary artery disease)    a. 12/2011 NSTEMI/Cath/PCI LCX (2.25x14 Resolute DES) & D1 (2.25x22 Resolute DES);  b. 01/2012 Cath/PCI: LM 30, LAD 30p, 40-13m, D1 stent ok, 99 in sm branch of diag, LCX patent stent, OM1 20, RCA 95 ost (4.0x12 Promus DES), EF 55%;  c. 04/2012 Lexi Cardiolite  EF 48%, small area of scar @ base/mid inflat wall with mild peri-infarct ischemia.; CABG 12/4  . Carotid artery disease ([place])    a. 37-85% LICA, 02/8501   . Chronic bronchitis ([place])   . Chronic diastolic CHF (congestive heart failure) ([name])    a. 02/2012 Echo EF 60-65%, nl wall motion, Gr 1 DD, mod MR  . Colon cancer ([place]) 1992  . Esophageal stricture   . ESRD on hemodialysis ([place])    ESRD due to HTN, started dialysis 2011 and gets HD at [facility] with Dr Hinda Lenis on MWF schedule.  Access is LUA AVF as of Sept 2014.   Marland Kitchen GERD (gastroesophageal reflux disease)   . High cholesterol 12/2011  . History of blood transfusion 07/2011; 12/2011; 01/2012 X 2; 04/2012  . History of gout   . History of lower GI bleeding   . Hypertension   . Iron deficiency anemia   . Mitral regurgitation    a. Moderate by echo, 02/2012  . Myocardial infarction ([place])   . Ovarian cancer ([place]) 1992  . Pneumonia ~ 2009  .  PUD (peptic ulcer disease)   . TIA (transient ischemic attack)     (Not in a hospital admission)  Infusions:  Allergies  Allergen Reactions  . Aspirin Other (See Comments)    High Doses Mess up her stomach; "makes my bowels have blood in them". Takes 81 mg EC Aspirin   . Penicillins Other (See Comments)    SYNCOPE? , "makes me real weak when I take it; like I'll pass out"  Has patient had a PCN reaction causing immediate rash, facial/tongue/throat swelling, SOB or lightheadedness with hypotension: Yes Has patient had a PCN reaction causing severe rash involving mucus membranes or skin necrosis: no Has patient had a PCN reaction that required hospitalization no Has patient had a PCN reaction occurring within the last 10 years: no If all of the above  . Amlodipine Swelling  . Bactrim [Sulfamethoxazole-Trimethoprim] Rash  . Contrast Media [Iodinated Diagnostic Agents] Itching  . Iron Itching and Other (See Comments)    "they gave me iron in dialysis; had to give me Benadryl cause I had to have the iron" (05/02/2012)  . Nitrofurantoin Hives  . Tylenol [Acetaminophen] Itching and Other (See Comments)    Makes her feet on fire per pt  . Gabapentin Other (See Comments)    Unknown reaction  . Dexilant [Dexlansoprazole] Other (See Comments)    Upset stomach  . Levaquin [Levofloxacin In D5w] Rash  . Morphine And Related Itching and Other (See Comments)    Itching in feet  . Plavix [Clopidogrel Bisulfate] Rash  . Protonix [Pantoprazole Sodium] Rash  . Venofer [Ferric Oxide] Itching and Other (See Comments)    Patient reports using Benadryl prior to doses as [place] History   Socioeconomic History  . Marital status: Married  Spouse name: Not on file  . Number of children: Not on file  . Years of education: Not on file  . Highest education level: Not on file  Social Needs  . Financial resource strain: Not on file  . Food insecurity - worry: Not on file  . Food  insecurity - inability: Not on file  . Transportation needs - medical: Not on file  . Transportation needs - non-medical: Not on file  Occupational History  . Not on file  Tobacco Use  . Smoking status: Never Smoker  . Smokeless tobacco: Never Used  Substance and Sexual Activity  . Alcohol use: No    Alcohol/week: 0.0 oz  . Drug use: No  . Sexual activity: Yes    Birth control/protection: Surgical  Other Topics Concern  . Not on file  Social History Narrative   Lives in [place], [place] with husband.  Dialysis pt - mwf.   Family History  Problem Relation Age of Onset  . Heart disease Mother        Heart Disease before age 1  . Hyperlipidemia Mother   . Hypertension Mother   . Diabetes Mother   . Heart attack Mother   . Heart disease Father        Heart Disease before age 89  . Hyperlipidemia Father   . Hypertension Father   . Diabetes Father   . Diabetes Sister   . Hypertension Sister   . Diabetes Brother   . Hyperlipidemia Brother   . Heart attack Brother   . Hypertension Sister   . Heart attack Brother   . Other Unknown        noncontributory for early CAD  . Colon cancer Neg Hx   . Esophageal cancer Neg Hx   . Liver disease Neg Hx   . Kidney disease Neg Hx   . Colon polyps Neg Hx    PHYSICAL EXAM: Vitals:   07/01/17 1239 07/01/17 1900  BP: (!) 124/55   Pulse: 67 65  Resp: 16 12  Temp: 98.2 F (36.8 C)   SpO2: 100% 99%   No intake or output data in the 24 hours ending 07/01/17 2113  General:  Well appearing. No respiratory difficulty HEENT: normal Neck: supple. no JVD. Carotids 2+ bilat; no bruits. No lymphadenopathy or thryomegaly appreciated. Cor: PMI nondisplaced. Regular rate & rhythm. No reproducible tenderness  Lungs: clear Abdomen: soft, nontender, nondistended. No hepatosplenomegaly. No bruits or masses. Good bowel sounds. Extremities: no cyanosis, clubbing, rash, edema Neuro: alert & oriented x 3, cranial nerves grossly intact. moves all 4  extremities w/o difficulty. Affect pleasant.  ECG:  Results for orders placed or performed during the hospital encounter of 07/01/17 (from the past 24 hour(s))  CBC     Status: Abnormal   Collection Time: 07/01/17  1:23 PM  Result Value Ref Range   WBC 6.8 4.0 - 10.5 K/uL   RBC 3.28 (L) 3.87 - 5.11 MIL/uL   Hemoglobin 10.6 (L) 12.0 - 15.0 g/dL   HCT 33.6 (L) 36.0 - 46.0 %   MCV 102.4 (H) 78.0 - 100.0 fL   MCH 32.3 26.0 - 34.0 pg   MCHC 31.5 30.0 - 36.0 g/dL   RDW 16.1 (H) 11.5 - 15.5 %   Platelets 213 150 - 400 K/uL  Comprehensive metabolic panel     Status: Abnormal   Collection Time: 07/01/17  1:24 PM  Result Value Ref Range   Sodium 137 135 - 145 mmol/L  Potassium 4.5 3.5 - 5.1 mmol/L   Chloride 100 (L) 101 - 111 mmol/L   CO2 26 22 - 32 mmol/L   Glucose, Bld 90 65 - 99 mg/dL   BUN 45 (H) 6 - 20 mg/dL   Creatinine, Ser 6.60 (H) 0.44 - 1.00 mg/dL   Calcium 9.0 8.9 - 10.3 mg/dL   Total Protein 6.3 (L) 6.5 - 8.1 g/dL   Albumin 3.4 (L) 3.5 - 5.0 g/dL   AST 20 15 - 41 U/L   ALT 16 14 - 54 U/L   Alkaline Phosphatase 58 38 - 126 U/L   Total Bilirubin 0.7 0.3 - 1.2 mg/dL   GFR calc non Af Amer 5 (L) >60 mL/min   GFR calc Af Amer 6 (L) >60 mL/min   Anion gap 11 5 - 15  I-stat troponin, ED     Status: None   Collection Time: 07/01/17  1:37 PM  Result Value Ref Range   Troponin i, poc 0.02 0.00 - 0.08 ng/mL   Comment 3          Troponin I     Status: Abnormal   Collection Time: 07/01/17  5:00 PM  Result Value Ref Range   Troponin I 0.04 (HH) <0.03 ng/mL   Dg Chest Port 1 View  Result Date: 07/01/2017 CLINICAL DATA:  Right side chest pain EXAM: PORTABLE CHEST 1 VIEW COMPARISON:  03/29/2017 FINDINGS: Interval removal of right dialysis catheter. Prior CABG. Bibasilar atelectasis or scarring. Heart is upper limits normal in size. No effusions or acute bony abnormality. IMPRESSION: Bibasilar atelectasis or scarring. Electronically Signed   By: Rolm Baptise M.D.   On: 07/01/2017  13:15   ASSESSMENT:  Atypical chest pain Significant CAD hx with multiple prior interventions TIA HTN, ESRD CABG with LIMA to LAD, SVG to ramus and SVG to RCA  PLAN/DISCUSSION:  EKG did not show any acute ST-T changes concerning for ischemia. Most recent echo from April showed normal EF of 50-55%. There was some mild MR noted.   Troponin went from 0.02 to 0.04. Requested a last trop to ensure it did not trend up higher, but ER refused and wanted patient to be d/c and just f/u as an outpatient. However, chest pain did seem atypical.   Would have recommended to initiate imdur therapy at 30mg , Qday to see if this will benefit her, but patient was d/c prior to being able to speak with her.  She may continue her home coreg, clonidine, brilinta, asa and hydralazine.   She will make a f/u appt with Dr. Bronson Ing in the near future.  Willeen Cass  Cardiology fellow

## 2017-07-01 NOTE — ED Provider Notes (Signed)
[facility] EMERGENCY DEPARTMENT Provider Note   CSN: [id number] Arrival date & time: 07/01/17  1226     History   Chief Complaint Chief Complaint  Patient presents with  . Chest Pain    HPI Shawna [name] is a 76 y.o. female.  The history is provided by the patient. No language interpreter was used.  Chest Pain      Shawna [name] is a 76 y.o. female who presents to the Emergency Department complaining of chest pain.  Last night she developed chest tightness and heaviness.  Pain is waxing and waning and radiates to her back at times.  She denies any associated diaphoresis, shortness of breath, abdominal pain, nausea, vomiting.  She has a history of ESRD and is on dialysis.  She dialyzes Monday Wednesday Friday but she received daily dialysis sessions last week due to difficulties with tolerating sessions due to hypotension.  She did take a nitroglycerin at home x3 since last night with significant improvement in her chest pain.  She describes her pain currently as minimal.  She did take all of her prescribed medications this morning.    Past Medical History:  Diagnosis Date  . Acute on chronic respiratory failure with hypoxia ([place]) 10/10/2016  . Anxiety   . Arthritis   . AVM (arteriovenous malformation) of colon   . CAD (coronary artery disease)    a. 12/2011 NSTEMI/Cath/PCI LCX (2.25x14 Resolute DES) & D1 (2.25x22 Resolute DES);  b. 01/2012 Cath/PCI: LM 30, LAD 30p, 40-30m, D1 stent ok, 99 in sm branch of diag, LCX patent stent, OM1 20, RCA 95 ost (4.0x12 Promus DES), EF 55%;  c. 04/2012 Lexi Cardiolite  EF 48%, small area of scar @ base/mid inflat wall with mild peri-infarct ischemia.; CABG 12/4  . Carotid artery disease ([place])    a. 01-75% LICA, 07/256   . Chronic bronchitis ([name])   . Chronic diastolic CHF (congestive heart failure) ([place])    a. 02/2012 Echo EF 60-65%, nl wall motion, Gr 1 DD, mod MR  . Colon cancer ([name]) 1992  . Esophageal stricture   . ESRD on  hemodialysis ([place])    ESRD due to HTN, started dialysis 2011 and gets HD at [facility] with Dr Hinda Lenis on MWF schedule.  Access is LUA AVF as of Sept 2014.   Marland Kitchen GERD (gastroesophageal reflux disease)   . High cholesterol 12/2011  . History of blood transfusion 07/2011; 12/2011; 01/2012 X 2; 04/2012  . History of gout   . History of lower GI bleeding   . Hypertension   . Iron deficiency anemia   . Mitral regurgitation    a. Moderate by echo, 02/2012  . Myocardial infarction ([place])   . Ovarian cancer ([place]) 1992  . Pneumonia ~ 2009  . PUD (peptic ulcer disease)   . TIA (transient ischemic attack)     Patient Active Problem List   Diagnosis Date Noted  . Coronary artery disease of bypass graft of native heart with stable angina pectoris ([place]) 06/05/2017  . Mesenteric ischemia ([place])   . Diverticulitis   . SBO (small bowel obstruction) ([place]) 03/22/2017  . Enteritis   . Complication of vascular access for dialysis 03/19/2017  . Preoperative clearance 01/25/2017  . Symptomatic anemia 10/24/2016  . H/O non-ST elevation myocardial infarction (NSTEMI) 10/24/2016  . Fluid overload 10/10/2016  . Complication from renal dialysis device 10/10/2016  . Non-ST elevation MI (NSTEMI) ([place])   . Encounter for fitting and adjustment of  vascular catheter   . End stage renal disease ([place])   . ESRD (end stage renal disease) ([place])   . Heme positive stool   . Demand ischemia ([place]) 07/27/2016  . Hypertensive emergency 07/08/2016  . Acute on chronic respiratory failure with hypoxia ([place])   . Cardiac arrest ([name])   . Palliative care encounter   . Goals of care, counseling/discussion   . Hypertensive crisis without congestive heart failure 05/09/2016  . Acute pulmonary edema ([place]) 04/06/2016  . Acute respiratory failure ([place]) 04/06/2016  . Hypertensive crisis 01/27/2016  . History of colon cancer 01/27/2016  . History of ovarian cancer 01/27/2016  . Hypertensive urgency 01/27/2016  . Narrow complex  tachycardia ([place]) 09/08/2015  . SVT (supraventricular tachycardia) ([place]) 09/08/2015  . Influenza A 08/30/2015  . Acute on chronic diastolic CHF (congestive heart failure) ([place]) 05/04/2015  . Unstable angina ([place]) 05/03/2015  . Essential hypertension   . Pain in joint, lower leg 08/14/2014  . Chest pain 11/26/2013  . Small bowel obstruction, partial ([place]) 05/29/2013  . Chronic diastolic CHF (congestive heart failure) ([place]) 03/22/2013  . GI bleeding 03/21/2013  . Acute blood loss anemia 03/21/2013  . Vaginal odor 03/12/2013  . Vaginal discharge 03/12/2013  . Occlusion and stenosis of carotid artery without mention of cerebral infarction 01/24/2013  . Hx of CABG 07/05/2012  . Carotid artery disease ([place]) 07/05/2012  . Anemia of chronic kidney failure 07/03/2012  . Secondary hyperparathyroidism ([place]) 07/03/2012  . Mitral regurgitation 06/12/2012  . Pneumonia 06/09/2012  . Non-STEMI (non-ST elevated myocardial infarction) ([place]) 06/08/2012  . AVM (arteriovenous malformation) of small bowel, acquired ([place]) 01/20/2012  . GERD (gastroesophageal reflux disease) 01/09/2012  . HLD (hyperlipidemia) 01/05/2012  . Atherosclerosis of native coronary artery of native heart without angina pectoris 12/16/2011  . ESRD on hemodialysis ([place]) 12/16/2011    Past Surgical History:  Procedure Laterality Date  . ABDOMINAL HYSTERECTOMY  1992  . APPENDECTOMY  06/1990  . AV FISTULA PLACEMENT  07/2009   left upper arm  . AV FISTULA PLACEMENT Right 09/06/2016   Procedure: RIGHT FOREARM ARTERIOVENOUS (AV) GRAFT;  Surgeon: Elam Dutch, MD;  Location: [facility] OR;  Service: Vascular;  Laterality: Right;  . AV FISTULA PLACEMENT N/A 02/24/2017   Procedure: INSERTION OF ARTERIOVENOUS (AV) GORE-TEX GRAFT ARM (BRACHIAL AXILLARY);  Surgeon: Katha Cabal, MD;  Location: [facility] ORS;  Service: Vascular;  Laterality: N/A;  . [place] Right 09/06/2016   Procedure: REMOVAL OF Right Arm ARTERIOVENOUS GORETEX GRAFT and  Vein Patch angioplasty of brachial artery;  Surgeon: Angelia Mould, MD;  Location: [facility];  Service: Vascular;  Laterality: Right;  . COLON RESECTION  1992  . COLON SURGERY    . CORONARY ANGIOPLASTY WITH STENT PLACEMENT  12/15/11   "2"  . CORONARY ANGIOPLASTY WITH STENT PLACEMENT  y/2013   "1; makes total of 3" (05/02/2012)  . CORONARY ARTERY BYPASS GRAFT  06/13/2012   Procedure: CORONARY ARTERY BYPASS GRAFTING (CABG);  Surgeon: Grace Isaac, MD;  Location: [facility];  Service: Open Heart Surgery;  Laterality: N/A;  cabg x four;  using left internal mammary artery, and left leg greater saphenous vein harvested endoscopically  . CORONARY STENT INTERVENTION N/A 10/13/2016   Procedure: Coronary Stent Intervention;  Surgeon: Troy Sine, MD;  Location: [facility] CV LAB;  Service: Cardiovascular;  Laterality: N/A;  . DIALYSIS/PERMA CATHETER REMOVAL N/A 04/18/2017   Procedure: DIALYSIS/PERMA CATHETER REMOVAL;  Surgeon: Katha Cabal, MD;  Location: [facility] INVASIVE CV  LAB;  Service: Cardiovascular;  Laterality: N/A;  . DILATION AND CURETTAGE OF UTERUS    . ESOPHAGOGASTRODUODENOSCOPY  01/20/2012   Procedure: ESOPHAGOGASTRODUODENOSCOPY (EGD);  Surgeon: Ladene Artist, MD,FACG;  Location: [facility] ENDOSCOPY;  Service: Endoscopy;  Laterality: N/A;  . ESOPHAGOGASTRODUODENOSCOPY N/A 03/26/2013   Procedure: ESOPHAGOGASTRODUODENOSCOPY (EGD);  Surgeon: Irene Shipper, MD;  Location: [facility] ENDOSCOPY;  Service: Endoscopy;  Laterality: N/A;  . ESOPHAGOGASTRODUODENOSCOPY N/A 04/30/2015   Procedure: ESOPHAGOGASTRODUODENOSCOPY (EGD);  Surgeon: Rogene Houston, MD;  [facility];  Service: Endoscopy;  Laterality: N/A;  1pm - moved to 10/20 @ 1:10  . ESOPHAGOGASTRODUODENOSCOPY N/A 07/29/2016   Procedure: ESOPHAGOGASTRODUODENOSCOPY (EGD);  Surgeon: Manus Gunning, MD;  Location: [facility];  Service: Gastroenterology;  Laterality: N/A;  enteroscopy  . INTRAOPERATIVE TRANSESOPHAGEAL ECHOCARDIOGRAM   06/13/2012   Procedure: INTRAOPERATIVE TRANSESOPHAGEAL ECHOCARDIOGRAM;  Surgeon: Grace Isaac, MD;  Location: [facility];  Service: Open Heart Surgery;  Laterality: N/A;  . IR GENERIC HISTORICAL  07/26/2016   IR FLUORO GUIDE CV LINE RIGHT 07/26/2016 Greggory Keen, MD [facility]  . IR GENERIC HISTORICAL  07/26/2016   IR US GUIDE VASC ACCESS RIGHT 07/26/2016 Greggory Keen, MD [facility]  . IR GENERIC HISTORICAL  08/02/2016   IR US GUIDE VASC ACCESS RIGHT 08/02/2016 Greggory Keen, MD [facility]  . IR GENERIC HISTORICAL  08/02/2016   IR FLUORO GUIDE CV LINE RIGHT 08/02/2016 Greggory Keen, MD [facility]  . IR RADIOLOGY PERIPHERAL GUIDED IV START  03/28/2017  . IR US GUIDE VASC ACCESS RIGHT  03/28/2017  . LEFT HEART CATH AND CORONARY ANGIOGRAPHY N/A 09/20/2016   Procedure: Left Heart Cath and Coronary Angiography;  Surgeon: [name], MD;  Location: [facility] CV LAB;  Service: Cardiovascular;  Laterality: N/A;  . LEFT HEART CATH AND CORS/GRAFTS ANGIOGRAPHY N/A 10/13/2016   Procedure: Left Heart Cath and Cors/Grafts Angiography;  Surgeon: Troy Sine, MD;  Location: [facility] CV LAB;  Service: Cardiovascular;  Laterality: N/A;  . LEFT HEART CATHETERIZATION WITH CORONARY ANGIOGRAM N/A 12/15/2011   Procedure: LEFT HEART CATHETERIZATION WITH CORONARY ANGIOGRAM;  Surgeon: Burnell Blanks, MD;  Location: [facility] CATH LAB;  Service: Cardiovascular;  Laterality: N/A;  . LEFT HEART CATHETERIZATION WITH CORONARY ANGIOGRAM N/A 01/10/2012   Procedure: LEFT HEART CATHETERIZATION WITH CORONARY ANGIOGRAM;  Surgeon: Peter M Martinique, MD;  Location: [facility] CATH LAB;  Service: Cardiovascular;  Laterality: N/A;  . LEFT HEART CATHETERIZATION WITH CORONARY ANGIOGRAM N/A 06/08/2012   Procedure: LEFT HEART CATHETERIZATION WITH CORONARY ANGIOGRAM;  Surgeon: Burnell Blanks, MD;  Location: [facility] CATH LAB;  Service: Cardiovascular;  Laterality: N/A;  . LEFT HEART CATHETERIZATION WITH CORONARY/GRAFT ANGIOGRAM N/A 12/10/2013    Procedure: LEFT HEART CATHETERIZATION WITH Beatrix Fetters;  Surgeon: [name], MD;  Location: [facility] CATH LAB;  Service: Cardiovascular;  Laterality: N/A;  . OVARY SURGERY     ovarian cancer  . REVISION OF ARTERIOVENOUS GORETEX GRAFT N/A 02/24/2017   Procedure: REVISION OF ARTERIOVENOUS GORETEX GRAFT (RESECTION);  Surgeon: Katha Cabal, MD;  Location: [facility] ORS;  Service: Vascular;  Laterality: N/A;  Earney Mallet N/A 10/15/2013   Procedure: Fistulogram;  Surgeon: Serafina Mitchell, MD;  Location: [facility] CATH LAB;  Service: Cardiovascular;  Laterality: N/A;  . THROMBECTOMY / ARTERIOVENOUS GRAFT REVISION  2011   left upper arm  . TUBAL LIGATION  1980's  . UPPER EXTREMITY ANGIOGRAPHY Bilateral 12/06/2016   Procedure: Upper Extremity Angiography;  Surgeon: Katha Cabal, MD;  Location: [facility] INVASIVE CV  LAB;  Service: Cardiovascular;  Laterality: Bilateral;  . UPPER EXTREMITY INTERVENTION Left 06/06/2017   Procedure: UPPER EXTREMITY INTERVENTION;  Surgeon: Katha Cabal, MD;  Location: [facility] CV LAB;  Service: Cardiovascular;  Laterality: Left;    OB History    Gravida Para Term Preterm AB Living   2 2   2   2    SAB TAB Ectopic Multiple Live Births                   Home Medications    Prior to Admission medications   Medication Sig Start Date End Date Taking? Authorizing Provider  ALPRAZolam (XANAX) 0.25 MG tablet Take 1 tablet (0.25 mg total) by mouth 2 (two) times daily as needed for anxiety or sleep. 04/05/17  Yes Arrien, Jimmy Picket, MD  amiodarone (PACERONE) 200 MG tablet Take 1 tablet (200 mg total) by mouth daily. 12/08/16  Yes Herminio Commons, MD  aspirin EC 81 MG tablet Take 81 mg by mouth daily.    Yes [provider]  carvedilol (COREG) 6.25 MG tablet TAKE 1 TABLET BY MOUTH TWICE DAILY WITH  A  MEAL Patient taking differently: TAKE 1 TABLET (6.25 MG) BY MOUTH TWICE DAILY WITH  A  MEAL 06/15/17  Yes Herminio Commons, MD    cinacalcet (SENSIPAR) 30 MG tablet Take 30 mg by mouth daily after supper.   Yes [provider]  cloNIDine (CATAPRES) 0.2 MG tablet Take 0.2 mg by mouth daily as needed (high blood pressure).    Yes [provider]  diphenhydrAMINE (BENADRYL) 25 mg capsule Take 50 mg by mouth daily as needed for itching or allergies.    Yes [provider]  epoetin alfa (EPOGEN,PROCRIT) 80881 UNIT/ML injection Inject 1 mL (10,000 Units total) into the vein every Monday, Wednesday, and Friday with hemodialysis. 04/11/16  Yes Orvan Falconer, MD  fluticasone (FLONASE) 50 MCG/ACT nasal spray Place 1 spray at bedtime as needed into both nostrils for allergies.    Yes [provider]  hydrALAZINE (APRESOLINE) 50 MG tablet Take 50 mg by mouth 2 (two) times daily as needed (high blood pressure).   Yes [provider]  isosorbide mononitrate (IMDUR) 120 MG 24 hr tablet Take 1 tablet (120 mg total) by mouth daily. 05/11/17  Yes Herminio Commons, MD  lidocaine-prilocaine (EMLA) cream Apply 1 application topically every Monday, Wednesday, and Friday. Prior to dialysis   Yes [provider]  multivitamin (RENA-VIT) TABS tablet Take 1 tablet by mouth daily.   Yes [provider]  nitroGLYCERIN (NITROSTAT) 0.4 MG SL tablet Place 1 tablet (0.4 mg total) under the tongue every 5 (five) minutes as needed for chest pain. 06/05/17  Yes Herminio Commons, MD  nystatin-triamcinolone (MYCOLOG II) cream Apply 1 application topically 2 (two) times daily. Patient taking differently: Apply 1 application topically 2 (two) times daily as needed (itching/rash).  05/30/17  Yes Rehman, Mechele Dawley, MD  omeprazole (PRILOSEC) 20 MG capsule Take 20 mg by mouth daily.   Yes [provider]  sevelamer carbonate (RENVELA) 800 MG tablet Take 800-2,400 mg by mouth See admin instructions. Take 3 tablets (2400) by mouth twice daily with meals, take 1 tablet (800 mg) with snacks   Yes  [provider]  simvastatin (ZOCOR) 20 MG tablet TAKE ONE TABLET BY MOUTH AT BEDTIME Patient taking differently: TAKE ONE TABLET (20 MG) BY MOUTH DAILY AT BEDTIME 02/13/17  Yes Herminio Commons, MD  ticagrelor (BRILINTA) 90 MG  TABS tablet Take 90 mg by mouth 2 (two) times daily.   Yes [provider]  oxycodone (OXY-IR) 5 MG capsule Take 1 capsule (5 mg total) by mouth every 6 (six) hours as needed for pain. Patient not taking: Reported on 07/01/2017 04/05/17   Arrien, Jimmy Picket, MD    Family History Family History  Problem Relation Age of Onset  . Heart disease Mother        Heart Disease before age 31  . Hyperlipidemia Mother   . Hypertension Mother   . Diabetes Mother   . Heart attack Mother   . Heart disease Father        Heart Disease before age 39  . Hyperlipidemia Father   . Hypertension Father   . Diabetes Father   . Diabetes Sister   . Hypertension Sister   . Diabetes Brother   . Hyperlipidemia Brother   . Heart attack Brother   . Hypertension Sister   . Heart attack Brother   . Other Unknown        noncontributory for early CAD  . Colon cancer Neg Hx   . Esophageal cancer Neg Hx   . Liver disease Neg Hx   . Kidney disease Neg Hx   . Colon polyps Neg Hx     Social History Social History   Tobacco Use  . Smoking status: Never Smoker  . Smokeless tobacco: Never Used  Substance Use Topics  . Alcohol use: No    Alcohol/week: 0.0 oz  . Drug use: No     Allergies   Aspirin; Penicillins; Amlodipine; Bactrim [sulfamethoxazole-trimethoprim]; Contrast media [iodinated diagnostic agents]; Iron; Nitrofurantoin; Tylenol [acetaminophen]; Gabapentin; Dexilant [dexlansoprazole]; Levaquin [levofloxacin in d5w]; Morphine and related; Plavix [clopidogrel bisulfate]; Protonix [pantoprazole sodium]; and Venofer [ferric oxide]   Review of Systems Review of Systems  Cardiovascular: Positive for chest pain.  All other systems reviewed and are  negative.    Physical Exam Updated Vital Signs BP (!) 124/55 (BP Location: Right Arm)   Pulse 67   Temp 98.2 F (36.8 C) (Oral)   Resp 16   Ht 5\' 1"  (1.549 m)   Wt 56.7 kg (125 lb)   SpO2 100%   BMI 23.62 kg/m   Physical Exam  Constitutional: She is oriented to person, place, and time. She appears well-developed and well-nourished.  HENT:  Head: Normocephalic and atraumatic.  Cardiovascular: Normal rate and regular rhythm.  No murmur heard. Pulmonary/Chest: Effort normal and breath sounds normal. No respiratory distress.  Abdominal: Soft. There is no tenderness. There is no rebound and no guarding.  Musculoskeletal: She exhibits no edema or tenderness.  Fistula in LUE  Neurological: She is alert and oriented to person, place, and time.  Skin: Skin is warm and dry.  Psychiatric: She has a normal mood and affect. Her behavior is normal.  Nursing note and vitals reviewed.    ED Treatments / Results  Labs (all labs ordered are listed, but only abnormal results are displayed) Labs Reviewed  CBC - Abnormal; Notable for the following components:      Result Value   RBC 3.28 (*)    Hemoglobin 10.6 (*)    HCT 33.6 (*)    MCV 102.4 (*)    RDW 16.1 (*)    All other components within normal limits  COMPREHENSIVE METABOLIC PANEL - Abnormal; Notable for the following components:   Chloride 100 (*)    BUN 45 (*)    Creatinine, Ser 6.60 (*)  Total Protein 6.3 (*)    Albumin 3.4 (*)    GFR calc non Af Amer 5 (*)    GFR calc Af Amer 6 (*)    All other components within normal limits  TROPONIN I  I-STAT TROPONIN, ED    EKG  EKG Interpretation  Date/Time:  Saturday July 01 2017 13:25:47 EST Ventricular Rate:  62 PR Interval:  176 QRS Duration: 109 QT Interval:  467 QTC Calculation: 475 R Axis:   -19 Text Interpretation:  Sinus rhythm Incomplete left bundle branch block LVH with secondary repolarization abnormality Anterior Q waves, possibly due to LVH Confirmed  by Elnora Morrison [phone]) on 07/01/2017 4:47:33 PM       Radiology Dg Chest Port 1 View  Result Date: 07/01/2017 CLINICAL DATA:  Right side chest pain EXAM: PORTABLE CHEST 1 VIEW COMPARISON:  03/29/2017 FINDINGS: Interval removal of right dialysis catheter. Prior CABG. Bibasilar atelectasis or scarring. Heart is upper limits normal in size. No effusions or acute bony abnormality. IMPRESSION: Bibasilar atelectasis or scarring. Electronically Signed   By: Rolm Baptise M.D.   On: 07/01/2017 13:15    Procedures Procedures (including critical care time)  Medications Ordered in ED Medications - No data to display   Initial Impression / Assessment and Plan / ED Course  I have reviewed the triage vital signs and the nursing notes.  Pertinent labs & imaging results that were available during my care of the patient were reviewed by me and considered in my medical decision making (see chart for details).     Patient with end-stage renal disease on dialysis as well as significant history of coronary artery disease here for evaluation of waxing and waning chest pain for the last 24 hours.  Initial troponin is negative.  Discussed with cardiologist on-call who recommends observation with serial troponins.  Patient care transferred pending repeat troponin.  Final Clinical Impressions(s) / ED Diagnoses   Final diagnoses:  None    ED Discharge Orders    None       Quintella Reichert, MD 07/01/17 1743

## 2017-07-01 NOTE — ED Provider Notes (Signed)
Patient's care signed out to follow-up repeat troponin. Troponin similar to previous troponins. Patient is in end-stage renal disease patient. Patient has no chest pain. Patient has cardiology follow-up. Discuss with cardiology on call and we discussed and agreed with close outpatient follow-up. Patient comfortable going home for dialysis tomorrow.   Labs Reviewed  CBC - Abnormal; Notable for the following components:      Result Value   RBC 3.28 (*)    Hemoglobin 10.6 (*)    HCT 33.6 (*)    MCV 102.4 (*)    RDW 16.1 (*)    All other components within normal limits  COMPREHENSIVE METABOLIC PANEL - Abnormal; Notable for the following components:   Chloride 100 (*)    BUN 45 (*)    Creatinine, Ser 6.60 (*)    Total Protein 6.3 (*)    Albumin 3.4 (*)    GFR calc non Af Amer 5 (*)    GFR calc Af Amer 6 (*)    All other components within normal limits  TROPONIN I - Abnormal; Notable for the following components:   Troponin I 0.04 (*)    All other components within normal limits  I-STAT TROPONIN, ED   Golda Acre, MD 07/01/17 2117

## 2017-07-01 NOTE — ED Triage Notes (Signed)
Pt states she has been having chest pain radiating to her back since last night. Pt describes the pain as an aching. Some radiation to her neck. Denies any shortness of breath. Reports she had some relief with nitro at home last night.

## 2017-07-01 NOTE — Discharge Instructions (Addendum)
Continue aspirin and home medicines.  Schedule appointment with cardiology after holiday.  If you were given medicines take as directed.  If you are on coumadin or contraceptives realize their levels and effectiveness is altered by many different medicines.  If you have any reaction (rash, tongues swelling, other) to the medicines stop taking and see a physician.    If your blood pressure was elevated in the ER make sure you follow up for management with a primary doctor or return for chest pain, shortness of breath or stroke symptoms.  Please follow up as directed and return to the ER or see a physician for new or worsening symptoms.  Thank you. Vitals:   07/01/17 1239 07/01/17 1240 07/01/17 1900  BP: (!) 124/55    Pulse: 67  65  Resp: 16  12  Temp: 98.2 F (36.8 C)    TempSrc: Oral    SpO2: 100%  99%  Weight:  56.7 kg (125 lb)   Height:  5\' 1"  (1.549 m)

## 2017-07-02 DIAGNOSIS — E8770 Fluid overload, unspecified: Secondary | ICD-10-CM | POA: Diagnosis not present

## 2017-07-02 DIAGNOSIS — Z992 Dependence on renal dialysis: Secondary | ICD-10-CM | POA: Diagnosis not present

## 2017-07-02 DIAGNOSIS — T82898A Other specified complication of vascular prosthetic devices, implants and grafts, initial encounter: Secondary | ICD-10-CM | POA: Diagnosis not present

## 2017-07-02 DIAGNOSIS — N186 End stage renal disease: Secondary | ICD-10-CM | POA: Diagnosis not present

## 2017-07-05 DIAGNOSIS — T82898A Other specified complication of vascular prosthetic devices, implants and grafts, initial encounter: Secondary | ICD-10-CM | POA: Diagnosis not present

## 2017-07-05 DIAGNOSIS — Z992 Dependence on renal dialysis: Secondary | ICD-10-CM | POA: Diagnosis not present

## 2017-07-05 DIAGNOSIS — N186 End stage renal disease: Secondary | ICD-10-CM | POA: Diagnosis not present

## 2017-07-05 DIAGNOSIS — E8770 Fluid overload, unspecified: Secondary | ICD-10-CM | POA: Diagnosis not present

## 2017-07-06 ENCOUNTER — Encounter (INDEPENDENT_AMBULATORY_CARE_PROVIDER_SITE_OTHER): Payer: Medicare HMO

## 2017-07-06 ENCOUNTER — Ambulatory Visit (INDEPENDENT_AMBULATORY_CARE_PROVIDER_SITE_OTHER): Payer: Medicare HMO | Admitting: Vascular Surgery

## 2017-07-07 DIAGNOSIS — Z992 Dependence on renal dialysis: Secondary | ICD-10-CM | POA: Diagnosis not present

## 2017-07-07 DIAGNOSIS — E8770 Fluid overload, unspecified: Secondary | ICD-10-CM | POA: Diagnosis not present

## 2017-07-07 DIAGNOSIS — T82898A Other specified complication of vascular prosthetic devices, implants and grafts, initial encounter: Secondary | ICD-10-CM | POA: Diagnosis not present

## 2017-07-07 DIAGNOSIS — N186 End stage renal disease: Secondary | ICD-10-CM | POA: Diagnosis not present

## 2017-07-09 DIAGNOSIS — T82898A Other specified complication of vascular prosthetic devices, implants and grafts, initial encounter: Secondary | ICD-10-CM | POA: Diagnosis not present

## 2017-07-09 DIAGNOSIS — E8770 Fluid overload, unspecified: Secondary | ICD-10-CM | POA: Diagnosis not present

## 2017-07-09 DIAGNOSIS — N186 End stage renal disease: Secondary | ICD-10-CM | POA: Diagnosis not present

## 2017-07-09 DIAGNOSIS — Z992 Dependence on renal dialysis: Secondary | ICD-10-CM | POA: Diagnosis not present

## 2017-07-10 DIAGNOSIS — Z992 Dependence on renal dialysis: Secondary | ICD-10-CM | POA: Diagnosis not present

## 2017-07-10 DIAGNOSIS — N186 End stage renal disease: Secondary | ICD-10-CM | POA: Diagnosis not present

## 2017-07-12 DIAGNOSIS — Z992 Dependence on renal dialysis: Secondary | ICD-10-CM | POA: Diagnosis not present

## 2017-07-12 DIAGNOSIS — N186 End stage renal disease: Secondary | ICD-10-CM | POA: Diagnosis not present

## 2017-07-13 ENCOUNTER — Encounter (HOSPITAL_COMMUNITY): Payer: Self-pay | Admitting: *Deleted

## 2017-07-13 DIAGNOSIS — R109 Unspecified abdominal pain: Secondary | ICD-10-CM | POA: Diagnosis not present

## 2017-07-13 DIAGNOSIS — N186 End stage renal disease: Secondary | ICD-10-CM | POA: Diagnosis not present

## 2017-07-13 DIAGNOSIS — D631 Anemia in chronic kidney disease: Secondary | ICD-10-CM | POA: Diagnosis present

## 2017-07-13 DIAGNOSIS — Z8711 Personal history of peptic ulcer disease: Secondary | ICD-10-CM

## 2017-07-13 DIAGNOSIS — N2581 Secondary hyperparathyroidism of renal origin: Secondary | ICD-10-CM | POA: Diagnosis present

## 2017-07-13 DIAGNOSIS — Z8673 Personal history of transient ischemic attack (TIA), and cerebral infarction without residual deficits: Secondary | ICD-10-CM

## 2017-07-13 DIAGNOSIS — D62 Acute posthemorrhagic anemia: Secondary | ICD-10-CM | POA: Diagnosis not present

## 2017-07-13 DIAGNOSIS — Z833 Family history of diabetes mellitus: Secondary | ICD-10-CM

## 2017-07-13 DIAGNOSIS — Z888 Allergy status to other drugs, medicaments and biological substances status: Secondary | ICD-10-CM

## 2017-07-13 DIAGNOSIS — I2510 Atherosclerotic heart disease of native coronary artery without angina pectoris: Secondary | ICD-10-CM | POA: Diagnosis present

## 2017-07-13 DIAGNOSIS — Z992 Dependence on renal dialysis: Secondary | ICD-10-CM

## 2017-07-13 DIAGNOSIS — M898X9 Other specified disorders of bone, unspecified site: Secondary | ICD-10-CM | POA: Diagnosis present

## 2017-07-13 DIAGNOSIS — I5032 Chronic diastolic (congestive) heart failure: Secondary | ICD-10-CM | POA: Diagnosis present

## 2017-07-13 DIAGNOSIS — Z8249 Family history of ischemic heart disease and other diseases of the circulatory system: Secondary | ICD-10-CM

## 2017-07-13 DIAGNOSIS — I132 Hypertensive heart and chronic kidney disease with heart failure and with stage 5 chronic kidney disease, or end stage renal disease: Secondary | ICD-10-CM | POA: Diagnosis present

## 2017-07-13 DIAGNOSIS — K219 Gastro-esophageal reflux disease without esophagitis: Secondary | ICD-10-CM | POA: Diagnosis present

## 2017-07-13 DIAGNOSIS — Z955 Presence of coronary angioplasty implant and graft: Secondary | ICD-10-CM

## 2017-07-13 DIAGNOSIS — R195 Other fecal abnormalities: Secondary | ICD-10-CM | POA: Diagnosis present

## 2017-07-13 DIAGNOSIS — Z8543 Personal history of malignant neoplasm of ovary: Secondary | ICD-10-CM

## 2017-07-13 DIAGNOSIS — Z85038 Personal history of other malignant neoplasm of large intestine: Secondary | ICD-10-CM

## 2017-07-13 DIAGNOSIS — Z8349 Family history of other endocrine, nutritional and metabolic diseases: Secondary | ICD-10-CM

## 2017-07-13 DIAGNOSIS — Z9049 Acquired absence of other specified parts of digestive tract: Secondary | ICD-10-CM

## 2017-07-13 DIAGNOSIS — K5520 Angiodysplasia of colon without hemorrhage: Secondary | ICD-10-CM | POA: Diagnosis present

## 2017-07-13 DIAGNOSIS — I25708 Atherosclerosis of coronary artery bypass graft(s), unspecified, with other forms of angina pectoris: Secondary | ICD-10-CM | POA: Diagnosis present

## 2017-07-13 DIAGNOSIS — I252 Old myocardial infarction: Secondary | ICD-10-CM

## 2017-07-13 DIAGNOSIS — Z881 Allergy status to other antibiotic agents status: Secondary | ICD-10-CM

## 2017-07-13 DIAGNOSIS — E785 Hyperlipidemia, unspecified: Secondary | ICD-10-CM | POA: Diagnosis present

## 2017-07-13 NOTE — ED Notes (Signed)
Patient states she is a hard stick, wants to wait on iv start for blood work

## 2017-07-13 NOTE — ED Triage Notes (Signed)
Pt reports onset of vomiting about 2 hours ago, pain in the mid and right abdomen. Dialysis pt m/w/f.

## 2017-07-14 ENCOUNTER — Encounter (HOSPITAL_COMMUNITY): Payer: Self-pay | Admitting: Family Medicine

## 2017-07-14 ENCOUNTER — Other Ambulatory Visit: Payer: Self-pay

## 2017-07-14 ENCOUNTER — Other Ambulatory Visit (HOSPITAL_COMMUNITY): Payer: Self-pay

## 2017-07-14 ENCOUNTER — Inpatient Hospital Stay (HOSPITAL_COMMUNITY)
Admission: EM | Admit: 2017-07-14 | Discharge: 2017-07-15 | DRG: 811 | Disposition: A | Discharge status: Home / Self Care | Payer: Medicare HMO | Attending: Family Medicine | Admitting: Family Medicine

## 2017-07-14 ENCOUNTER — Emergency Department (HOSPITAL_COMMUNITY): Payer: Medicare HMO

## 2017-07-14 DIAGNOSIS — K559 Vascular disorder of intestine, unspecified: Secondary | ICD-10-CM | POA: Diagnosis not present

## 2017-07-14 DIAGNOSIS — Z85038 Personal history of other malignant neoplasm of large intestine: Secondary | ICD-10-CM

## 2017-07-14 DIAGNOSIS — R1110 Vomiting, unspecified: Secondary | ICD-10-CM | POA: Diagnosis not present

## 2017-07-14 DIAGNOSIS — K5520 Angiodysplasia of colon without hemorrhage: Secondary | ICD-10-CM | POA: Diagnosis present

## 2017-07-14 DIAGNOSIS — I2510 Atherosclerotic heart disease of native coronary artery without angina pectoris: Secondary | ICD-10-CM | POA: Diagnosis present

## 2017-07-14 DIAGNOSIS — R195 Other fecal abnormalities: Secondary | ICD-10-CM | POA: Diagnosis present

## 2017-07-14 DIAGNOSIS — I10 Essential (primary) hypertension: Secondary | ICD-10-CM | POA: Diagnosis present

## 2017-07-14 DIAGNOSIS — N186 End stage renal disease: Secondary | ICD-10-CM

## 2017-07-14 DIAGNOSIS — Z8543 Personal history of malignant neoplasm of ovary: Secondary | ICD-10-CM | POA: Diagnosis not present

## 2017-07-14 DIAGNOSIS — Z955 Presence of coronary angioplasty implant and graft: Secondary | ICD-10-CM | POA: Diagnosis not present

## 2017-07-14 DIAGNOSIS — E782 Mixed hyperlipidemia: Secondary | ICD-10-CM | POA: Diagnosis not present

## 2017-07-14 DIAGNOSIS — R109 Unspecified abdominal pain: Secondary | ICD-10-CM | POA: Diagnosis not present

## 2017-07-14 DIAGNOSIS — K558 Other vascular disorders of intestine: Secondary | ICD-10-CM | POA: Diagnosis not present

## 2017-07-14 DIAGNOSIS — K922 Gastrointestinal hemorrhage, unspecified: Secondary | ICD-10-CM | POA: Diagnosis present

## 2017-07-14 DIAGNOSIS — Z8249 Family history of ischemic heart disease and other diseases of the circulatory system: Secondary | ICD-10-CM | POA: Diagnosis not present

## 2017-07-14 DIAGNOSIS — I252 Old myocardial infarction: Secondary | ICD-10-CM | POA: Diagnosis not present

## 2017-07-14 DIAGNOSIS — K219 Gastro-esophageal reflux disease without esophagitis: Secondary | ICD-10-CM | POA: Diagnosis present

## 2017-07-14 DIAGNOSIS — Z881 Allergy status to other antibiotic agents status: Secondary | ICD-10-CM | POA: Diagnosis not present

## 2017-07-14 DIAGNOSIS — Z992 Dependence on renal dialysis: Secondary | ICD-10-CM

## 2017-07-14 DIAGNOSIS — I5032 Chronic diastolic (congestive) heart failure: Secondary | ICD-10-CM | POA: Diagnosis present

## 2017-07-14 DIAGNOSIS — D539 Nutritional anemia, unspecified: Secondary | ICD-10-CM | POA: Diagnosis present

## 2017-07-14 DIAGNOSIS — Z8673 Personal history of transient ischemic attack (TIA), and cerebral infarction without residual deficits: Secondary | ICD-10-CM | POA: Diagnosis not present

## 2017-07-14 DIAGNOSIS — N2581 Secondary hyperparathyroidism of renal origin: Secondary | ICD-10-CM | POA: Diagnosis present

## 2017-07-14 DIAGNOSIS — Z951 Presence of aortocoronary bypass graft: Secondary | ICD-10-CM | POA: Diagnosis not present

## 2017-07-14 DIAGNOSIS — D62 Acute posthemorrhagic anemia: Secondary | ICD-10-CM | POA: Diagnosis present

## 2017-07-14 DIAGNOSIS — I25708 Atherosclerosis of coronary artery bypass graft(s), unspecified, with other forms of angina pectoris: Secondary | ICD-10-CM

## 2017-07-14 DIAGNOSIS — E785 Hyperlipidemia, unspecified: Secondary | ICD-10-CM | POA: Diagnosis present

## 2017-07-14 DIAGNOSIS — D631 Anemia in chronic kidney disease: Secondary | ICD-10-CM | POA: Diagnosis not present

## 2017-07-14 DIAGNOSIS — Z8711 Personal history of peptic ulcer disease: Secondary | ICD-10-CM | POA: Diagnosis not present

## 2017-07-14 DIAGNOSIS — Z9049 Acquired absence of other specified parts of digestive tract: Secondary | ICD-10-CM | POA: Diagnosis not present

## 2017-07-14 DIAGNOSIS — M898X9 Other specified disorders of bone, unspecified site: Secondary | ICD-10-CM | POA: Diagnosis present

## 2017-07-14 DIAGNOSIS — I779 Disorder of arteries and arterioles, unspecified: Secondary | ICD-10-CM | POA: Diagnosis present

## 2017-07-14 DIAGNOSIS — I739 Peripheral vascular disease, unspecified: Secondary | ICD-10-CM

## 2017-07-14 DIAGNOSIS — Z888 Allergy status to other drugs, medicaments and biological substances status: Secondary | ICD-10-CM | POA: Diagnosis not present

## 2017-07-14 DIAGNOSIS — I132 Hypertensive heart and chronic kidney disease with heart failure and with stage 5 chronic kidney disease, or end stage renal disease: Secondary | ICD-10-CM | POA: Diagnosis present

## 2017-07-14 DIAGNOSIS — K5521 Angiodysplasia of colon with hemorrhage: Secondary | ICD-10-CM | POA: Diagnosis present

## 2017-07-14 DIAGNOSIS — D649 Anemia, unspecified: Secondary | ICD-10-CM | POA: Diagnosis present

## 2017-07-14 LAB — COMPREHENSIVE METABOLIC PANEL
ALBUMIN: 3.5 g/dL (ref 3.5–5.0)
ALK PHOS: 50 U/L (ref 38–126)
ALT: 24 U/L (ref 14–54)
AST: 26 U/L (ref 15–41)
Anion gap: 15 (ref 5–15)
BUN: 49 mg/dL — AB (ref 6–20)
CALCIUM: 10.3 mg/dL (ref 8.9–10.3)
CO2: 28 mmol/L (ref 22–32)
Chloride: 95 mmol/L — ABNORMAL LOW (ref 101–111)
Creatinine, Ser: 8.06 mg/dL — ABNORMAL HIGH (ref 0.44–1.00)
GFR calc Af Amer: 5 mL/min — ABNORMAL LOW (ref >60)
GFR calc non Af Amer: 4 mL/min — ABNORMAL LOW (ref >60)
GLUCOSE: 109 mg/dL — AB (ref 65–99)
POTASSIUM: 4.9 mmol/L (ref 3.5–5.1)
Sodium: 138 mmol/L (ref 135–145)
Total Bilirubin: 0.8 mg/dL (ref 0.3–1.2)
Total Protein: 6.4 g/dL — ABNORMAL LOW (ref 6.5–8.1)

## 2017-07-14 LAB — CBC
HEMATOCRIT: 25.3 % — AB (ref 36.0–46.0)
Hemoglobin: 7.9 g/dL — ABNORMAL LOW (ref 12.0–15.0)
MCH: 32.4 pg (ref 26.0–34.0)
MCHC: 31.2 g/dL (ref 30.0–36.0)
MCV: 103.7 fL — ABNORMAL HIGH (ref 78.0–100.0)
Platelets: 184 10*3/uL (ref 150–400)
RBC: 2.44 MIL/uL — ABNORMAL LOW (ref 3.87–5.11)
RDW: 17.7 % — AB (ref 11.5–15.5)
WBC: 8.2 10*3/uL (ref 4.0–10.5)

## 2017-07-14 LAB — PREPARE RBC (CROSSMATCH)

## 2017-07-14 LAB — LIPASE, BLOOD: Lipase: 40 U/L (ref 11–51)

## 2017-07-14 LAB — POC OCCULT BLOOD, ED
FECAL OCCULT BLD: POSITIVE — AB
Fecal Occult Bld: POSITIVE — AB

## 2017-07-14 LAB — TROPONIN I
Troponin I: 0.06 ng/mL (ref <0.03)
Troponin I: 0.06 ng/mL (ref <0.03)
Troponin I: 0.11 ng/mL (ref <0.03)

## 2017-07-14 LAB — GLUCOSE, CAPILLARY: Glucose-Capillary: 109 mg/dL — ABNORMAL HIGH (ref 65–99)

## 2017-07-14 IMAGING — DX DG ABDOMEN ACUTE W/ 1V CHEST
3 series · 3 of 3 positions shown · non-contrast
Comparison: Prior radiograph from [DATE] and CT from
[DATE].

CLINICAL DATA: Initial evaluation for acute vomiting, mid and right
abdominal pain.

EXAM:
DG ABDOMEN ACUTE W/ 1V CHEST

[chest pa]
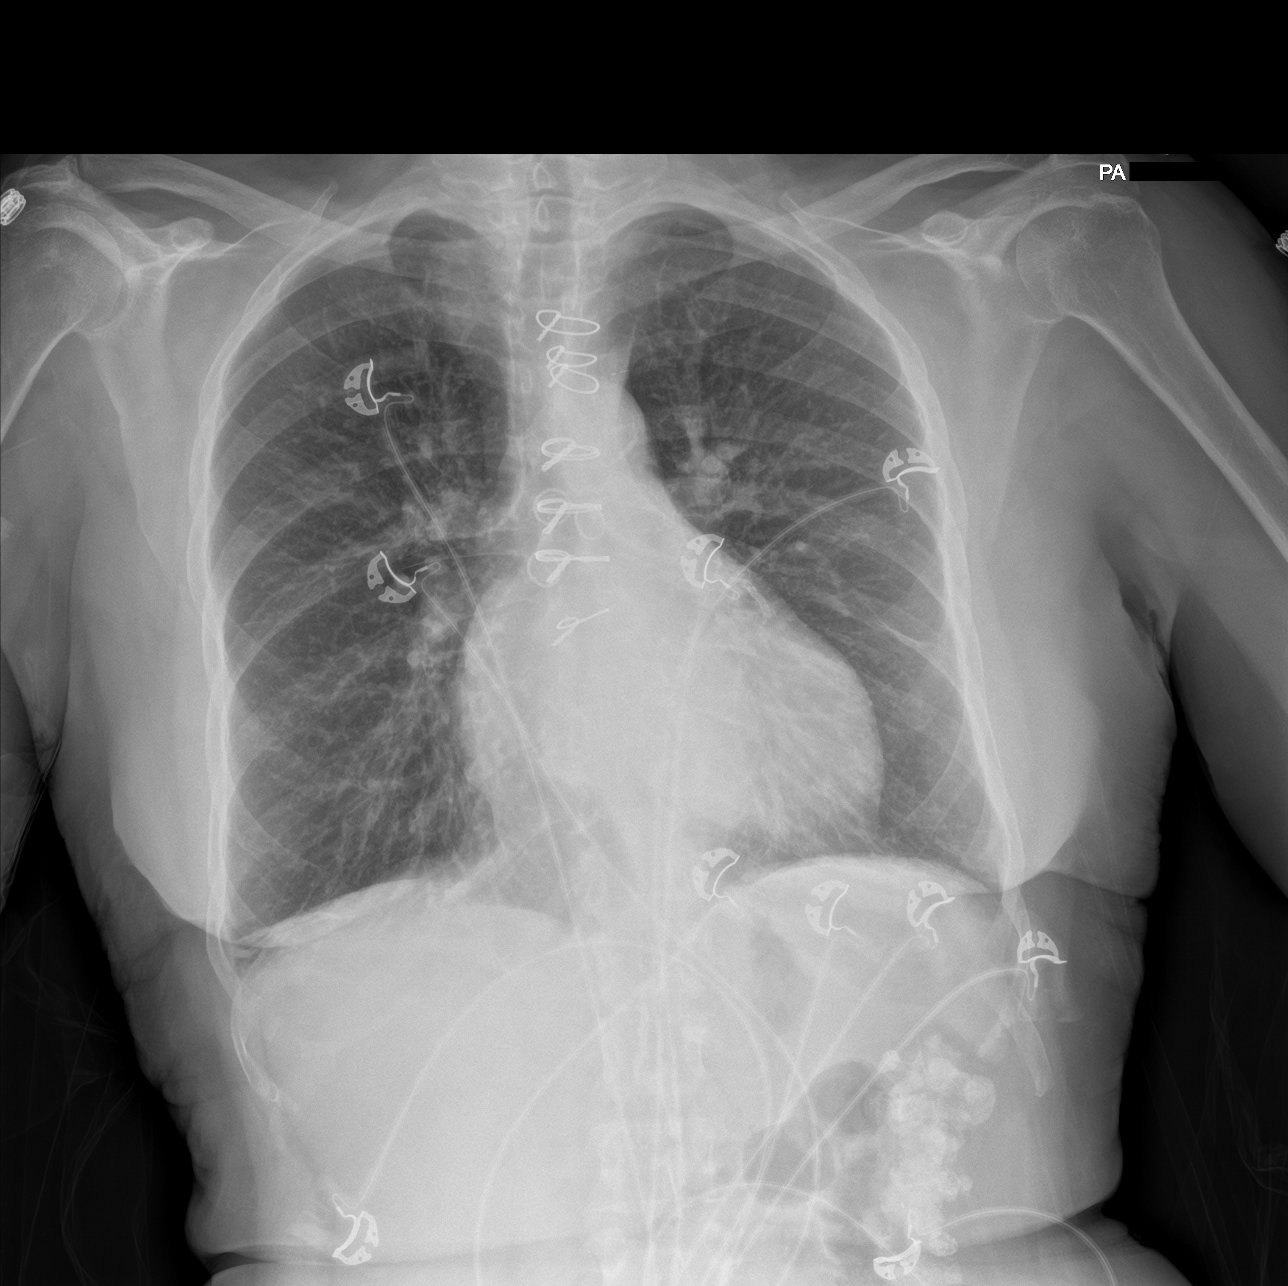

[abdomen erect]
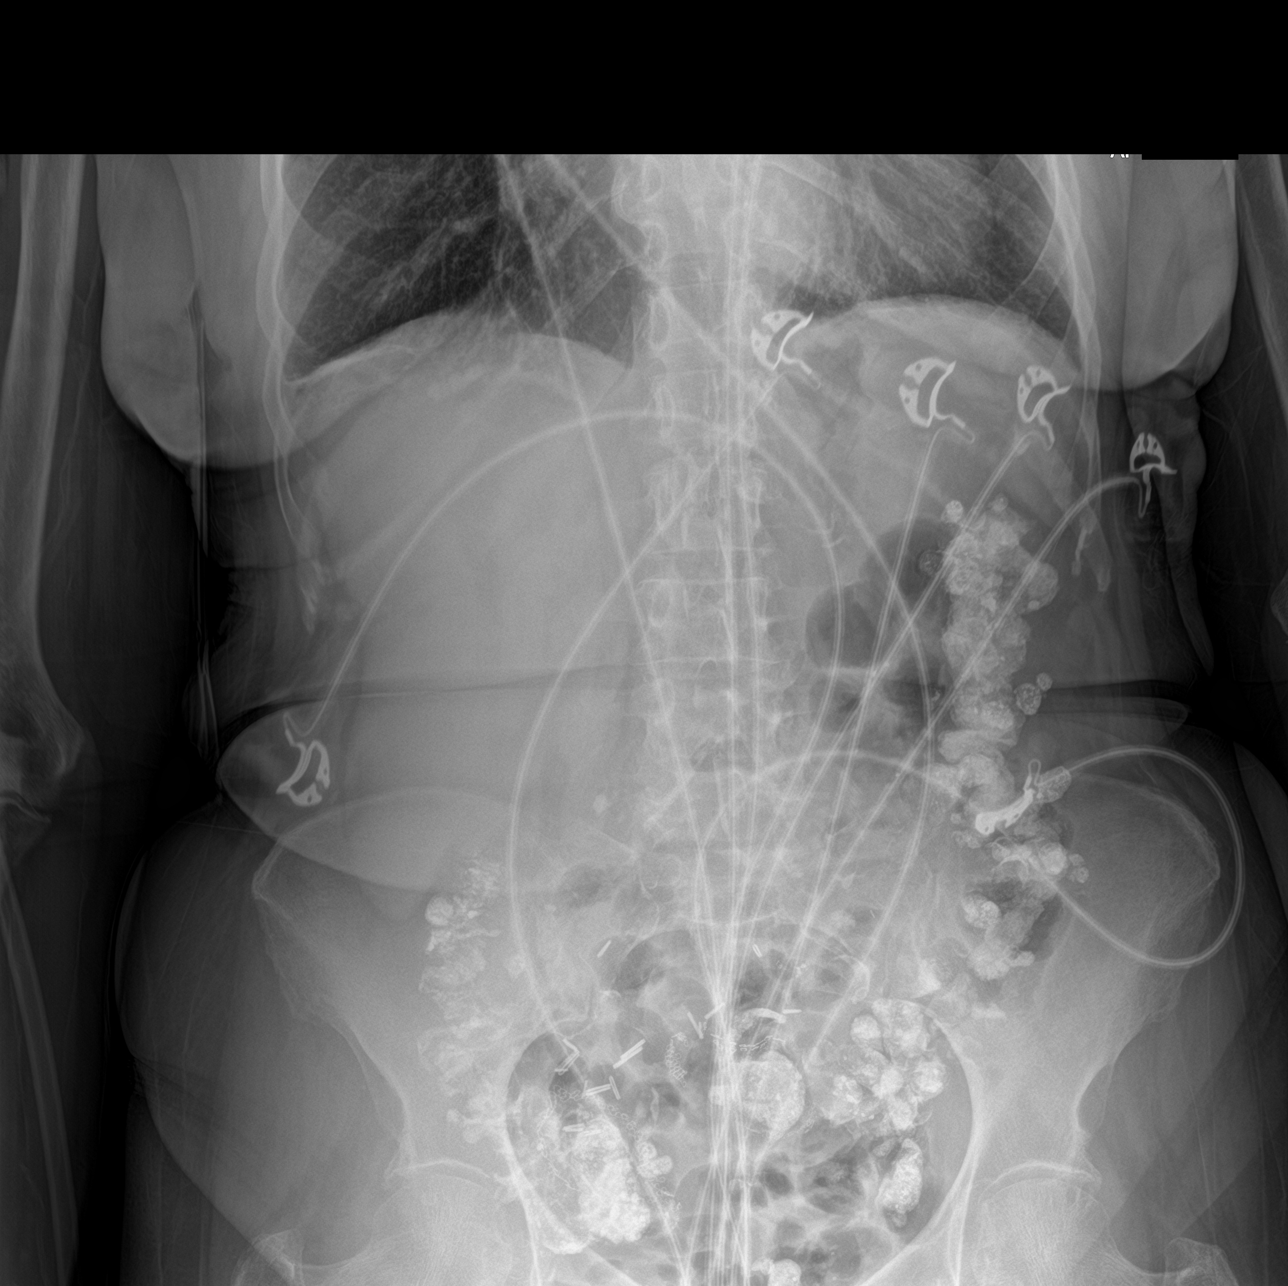

[abdomen supine]
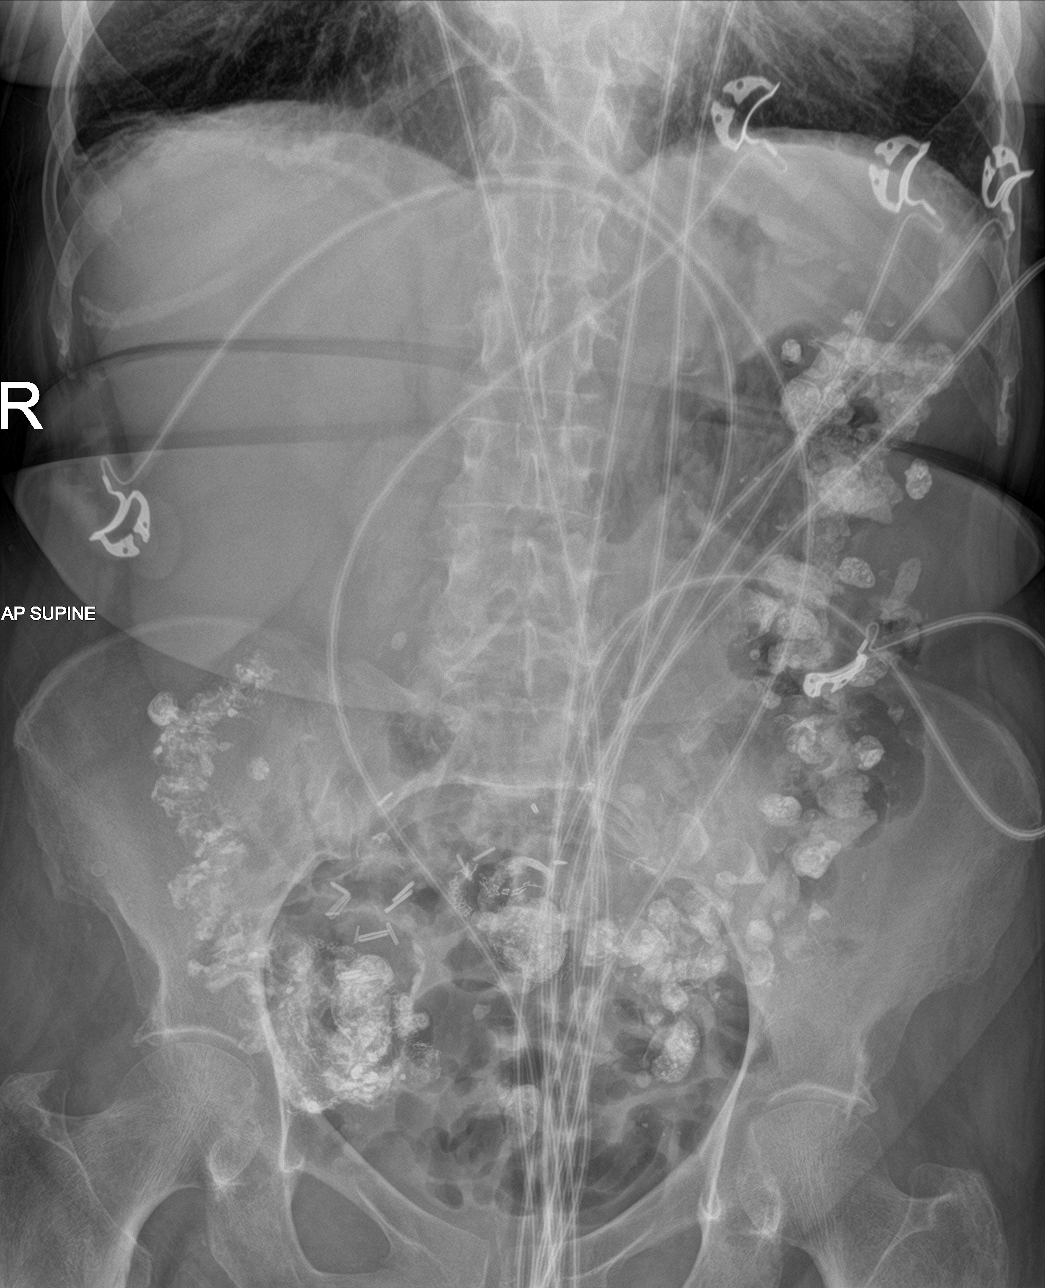

[3 of 3 positions shown; findings below may reference images not displayed]

FINDINGS: Median sternotomy wires underlying CABG markers and coronary stent.
Heart size within normal limits. Mediastinal silhouette normal.
Aortic atherosclerosis.

Lungs mildly hyperinflated. Left perihilar and bibasilar atelectasis
ache and/or scarring. No focal infiltrates. No pulmonary edema or
pleural effusion. No pneumothorax.

Bowel gas pattern within normal limits without obstruction or ileus.
Scattered retained contrast present throughout the colon. No free
air. Multiple surgical clips with suture material overlie the lower
mid abdomen and pelvis. No appreciable soft tissue mass or abnormal
calcification

Degenerative changes noted within the lower lumbar spine about the
hips bilaterally.
IMPRESSION: 1. Nonobstructive bowel gas pattern with no radiographic evidence
for acute intra-abdominal pathology. Retained contrast material
throughout the colon.
2. No active cardiopulmonary disease.

## 2017-07-14 MED ORDER — AMIODARONE HCL 200 MG PO TABS
200.0000 mg | ORAL_TABLET | Freq: Every day | ORAL | Status: DC
  Filled 2017-07-14: qty 1

## 2017-07-14 MED ORDER — SODIUM CHLORIDE 0.9 % IV SOLN: 100.0000 mL | INTRAVENOUS | Status: DC | PRN

## 2017-07-14 MED ORDER — ONDANSETRON HCL 4 MG PO TABS: 4.0000 mg | ORAL_TABLET | Freq: Four times a day (QID) | ORAL | Status: DC | PRN

## 2017-07-14 MED ORDER — EPOETIN ALFA 4000 UNIT/ML IJ SOLN
INTRAMUSCULAR | Status: AC
  Filled 2017-07-14: qty 1

## 2017-07-14 MED ORDER — HEPARIN SODIUM (PORCINE) 1000 UNIT/ML DIALYSIS
1000.0000 [IU] | INTRAMUSCULAR | Status: DC | PRN
  Filled 2017-07-14: qty 1

## 2017-07-14 MED ORDER — FLUTICASONE PROPIONATE 50 MCG/ACT NA SUSP
1.0000 | Freq: Every evening | NASAL | Status: DC | PRN
  Filled 2017-07-14: qty 16

## 2017-07-14 MED ORDER — PENTAFLUOROPROP-TETRAFLUOROETH EX AERO: 1.0000 "application " | INHALATION_SPRAY | CUTANEOUS | Status: DC | PRN

## 2017-07-14 MED ORDER — DIPHENHYDRAMINE HCL 25 MG PO CAPS: 50.0000 mg | ORAL_CAPSULE | Freq: Every day | ORAL | Status: DC | PRN

## 2017-07-14 MED ORDER — ALTEPLASE 2 MG IJ SOLR
2.0000 mg | Freq: Once | INTRAMUSCULAR | Status: DC | PRN
Start: 2017-07-14 — End: 2017-07-15
  Filled 2017-07-14: qty 2

## 2017-07-14 MED ORDER — RENA-VITE PO TABS
1.0000 | ORAL_TABLET | Freq: Every day | ORAL | Status: DC
  Administered 2017-07-15: 1 via ORAL
  Filled 2017-07-14 (×5): qty 1

## 2017-07-14 MED ORDER — SODIUM CHLORIDE 0.9 % IV SOLN: 250.0000 mL | INTRAVENOUS | Status: DC | PRN

## 2017-07-14 MED ORDER — LIDOCAINE-PRILOCAINE 2.5-2.5 % EX CREA: 1.0000 "application " | TOPICAL_CREAM | CUTANEOUS | Status: DC | PRN

## 2017-07-14 MED ORDER — ONDANSETRON 4 MG PO TBDP
8.0000 mg | ORAL_TABLET | Freq: Once | ORAL | Status: DC
  Filled 2017-07-14: qty 1

## 2017-07-14 MED ORDER — ISOSORBIDE MONONITRATE ER 60 MG PO TB24
120.0000 mg | ORAL_TABLET | Freq: Every day | ORAL | Status: DC
  Filled 2017-07-14 (×5): qty 2

## 2017-07-14 MED ORDER — CLONIDINE HCL 0.1 MG PO TABS
0.1000 mg | ORAL_TABLET | Freq: Two times a day (BID) | ORAL | Status: DC
  Filled 2017-07-14: qty 1

## 2017-07-14 MED ORDER — SODIUM CHLORIDE 0.9 % IV SOLN: Freq: Once | INTRAVENOUS | Status: DC

## 2017-07-14 MED ORDER — CINACALCET HCL 30 MG PO TABS
30.0000 mg | ORAL_TABLET | Freq: Every day | ORAL | Status: DC
  Filled 2017-07-14 (×3): qty 1

## 2017-07-14 MED ORDER — SODIUM CHLORIDE 0.9% FLUSH
3.0000 mL | Freq: Two times a day (BID) | INTRAVENOUS | Status: DC
  Administered 2017-07-14 – 2017-07-15 (×2): 3 mL via INTRAVENOUS

## 2017-07-14 MED ORDER — LIDOCAINE HCL (PF) 1 % IJ SOLN: 5.0000 mL | INTRAMUSCULAR | Status: DC | PRN

## 2017-07-14 MED ORDER — EPOETIN ALFA 4000 UNIT/ML IJ SOLN
3000.0000 [IU] | INTRAMUSCULAR | Status: DC
  Filled 2017-07-14: qty 1

## 2017-07-14 MED ORDER — ONDANSETRON HCL 4 MG/2ML IJ SOLN: 4.0000 mg | Freq: Four times a day (QID) | INTRAMUSCULAR | Status: DC | PRN

## 2017-07-14 MED ORDER — ONDANSETRON 4 MG PO TBDP: 4.0000 mg | ORAL_TABLET | Freq: Three times a day (TID) | ORAL | Status: DC | PRN

## 2017-07-14 MED ORDER — ALPRAZOLAM 0.25 MG PO TABS: 0.2500 mg | ORAL_TABLET | Freq: Two times a day (BID) | ORAL | Status: DC | PRN

## 2017-07-14 MED ORDER — SIMVASTATIN 20 MG PO TABS: 20.0000 mg | ORAL_TABLET | Freq: Every day | ORAL | Status: DC

## 2017-07-14 MED ORDER — CARVEDILOL 3.125 MG PO TABS
6.2500 mg | ORAL_TABLET | Freq: Two times a day (BID) | ORAL | Status: DC
  Administered 2017-07-14 – 2017-07-15 (×2): 6.25 mg via ORAL
  Filled 2017-07-14: qty 2
  Filled 2017-07-14: qty 1

## 2017-07-14 MED ORDER — SODIUM CHLORIDE 0.9% FLUSH: 3.0000 mL | INTRAVENOUS | Status: DC | PRN

## 2017-07-14 MED ORDER — NITROGLYCERIN 0.4 MG SL SUBL: 0.4000 mg | SUBLINGUAL_TABLET | SUBLINGUAL | Status: DC | PRN

## 2017-07-14 NOTE — ED Provider Notes (Signed)
[facility] EMERGENCY DEPARTMENT Provider Note   CSN: [id number] Arrival date & time: 07/13/17  2218     History   Chief Complaint Chief Complaint  Patient presents with  . Emesis    HPI Shawna [name] is a 77 y.o. female.  The history is provided by the patient and the spouse.  Emesis   This is a new problem. The problem has been gradually improving. The emesis has an appearance of stomach contents. There has been no fever. Associated symptoms include abdominal pain. Pertinent negatives include no fever.  Patient with history of CAD, end-stage renal disease on dialysis, presents with multiple complaints. Reports she was at her baseline yesterday but in the evening she began having nausea and pain in her abdomen Reports she vomited, it was nonbloody.  However she has had normal bowel movements recently, no blood is reported She reports she has chest wall tenderness with movement in the bed No new shortness of breath is reported  She then reports that some of her symptoms she feels may be due to amiodarone which has been taking for several months, she then proceeded to show me all the side effects of amiodarone and she says she has most of them Past Medical History:  Diagnosis Date  . Acute on chronic respiratory failure with hypoxia ([place]) 10/10/2016  . Anxiety   . Arthritis   . AVM (arteriovenous malformation) of colon   . CAD (coronary artery disease)    a. 12/2011 NSTEMI/Cath/PCI LCX (2.25x14 Resolute DES) & D1 (2.25x22 Resolute DES);  b. 01/2012 Cath/PCI: LM 30, LAD 30p, 40-20m, D1 stent ok, 99 in sm branch of diag, LCX patent stent, OM1 20, RCA 95 ost (4.0x12 Promus DES), EF 55%;  c. 04/2012 Lexi Cardiolite  EF 48%, small area of scar @ base/mid inflat wall with mild peri-infarct ischemia.; CABG 12/4  . Carotid artery disease ([place])    a. 74-94% LICA, 10/9673   . Chronic bronchitis ([place])   . Chronic diastolic CHF (congestive heart failure) ([place])    a. 02/2012 Echo EF 60-65%, nl  wall motion, Gr 1 DD, mod MR  . Colon cancer ([place]) 1992  . Esophageal stricture   . ESRD on hemodialysis ([place])    ESRD due to HTN, started dialysis 2011 and gets HD at [facility] with Dr Hinda Lenis on MWF schedule.  Access is LUA AVF as of Sept 2014.   Marland Kitchen GERD (gastroesophageal reflux disease)   . High cholesterol 12/2011  . History of blood transfusion 07/2011; 12/2011; 01/2012 X 2; 04/2012  . History of gout   . History of lower GI bleeding   . Hypertension   . Iron deficiency anemia   . Mitral regurgitation    a. Moderate by echo, 02/2012  . Myocardial infarction ([place])   . Ovarian cancer ([place]) 1992  . Pneumonia ~ 2009  . PUD (peptic ulcer disease)   . TIA (transient ischemic attack)     Patient Active Problem List   Diagnosis Date Noted  . Coronary artery disease of bypass graft of native heart with stable angina pectoris ([place]) 06/05/2017  . Mesenteric ischemia ([place])   . Diverticulitis   . SBO (small bowel obstruction) ([place]) 03/22/2017  . Enteritis   . Complication of vascular access for dialysis 03/19/2017  . Preoperative clearance 01/25/2017  . Symptomatic anemia 10/24/2016  . H/O non-ST elevation myocardial infarction (NSTEMI) 10/24/2016  . Fluid overload 10/10/2016  . Complication from renal dialysis device 10/10/2016  . Non-ST  elevation MI (NSTEMI) ([place])   . Encounter for fitting and adjustment of vascular catheter   . End stage renal disease ([place])   . ESRD (end stage renal disease) ([place])   . Heme positive stool   . Demand ischemia ([place]) 07/27/2016  . Hypertensive emergency 07/08/2016  . Acute on chronic respiratory failure with hypoxia ([place])   . Cardiac arrest ([place])   . Palliative care encounter   . Goals of care, counseling/discussion   . Hypertensive crisis without congestive heart failure 05/09/2016  . Acute pulmonary edema ([place]) 04/06/2016  . Acute respiratory failure ([place]) 04/06/2016  . Hypertensive crisis 01/27/2016  . History of colon cancer 01/27/2016  .  History of ovarian cancer 01/27/2016  . Hypertensive urgency 01/27/2016  . Narrow complex tachycardia ([place]) 09/08/2015  . SVT (supraventricular tachycardia) ([place]) 09/08/2015  . Influenza A 08/30/2015  . Acute on chronic diastolic CHF (congestive heart failure) ([place]) 05/04/2015  . Unstable angina ([place]) 05/03/2015  . Essential hypertension   . Pain in joint, lower leg 08/14/2014  . Chest pain 11/26/2013  . Small bowel obstruction, partial ([place]) 05/29/2013  . Chronic diastolic CHF (congestive heart failure) ([place]) 03/22/2013  . GI bleeding 03/21/2013  . Acute blood loss anemia 03/21/2013  . Vaginal odor 03/12/2013  . Vaginal discharge 03/12/2013  . Occlusion and stenosis of carotid artery without mention of cerebral infarction 01/24/2013  . Hx of CABG 07/05/2012  . Carotid artery disease ([place]) 07/05/2012  . Anemia of chronic kidney failure 07/03/2012  . Secondary hyperparathyroidism ([place]) 07/03/2012  . Mitral regurgitation 06/12/2012  . Pneumonia 06/09/2012  . Non-STEMI (non-ST elevated myocardial infarction) ([place]) 06/08/2012  . AVM (arteriovenous malformation) of small bowel, acquired ([place]) 01/20/2012  . GERD (gastroesophageal reflux disease) 01/09/2012  . HLD (hyperlipidemia) 01/05/2012  . Atherosclerosis of native coronary artery of native heart without angina pectoris 12/16/2011  . ESRD on hemodialysis ([place]) 12/16/2011    Past Surgical History:  Procedure Laterality Date  . ABDOMINAL HYSTERECTOMY  1992  . APPENDECTOMY  06/1990  . AV FISTULA PLACEMENT  07/2009   left upper arm  . AV FISTULA PLACEMENT Right 09/06/2016   Procedure: RIGHT FOREARM ARTERIOVENOUS (AV) GRAFT;  Surgeon: Elam Dutch, MD;  Location: [facility] OR;  Service: Vascular;  Laterality: Right;  . AV FISTULA PLACEMENT N/A 02/24/2017   Procedure: INSERTION OF ARTERIOVENOUS (AV) GORE-TEX GRAFT ARM (BRACHIAL AXILLARY);  Surgeon: Katha Cabal, MD;  Location: [facility] ORS;  Service: Vascular;  Laterality: N/A;  . [place] Right 09/06/2016   Procedure: REMOVAL OF Right Arm ARTERIOVENOUS GORETEX GRAFT and Vein Patch angioplasty of brachial artery;  Surgeon: Angelia Mould, MD;  Location: [place];  Service: Vascular;  Laterality: Right;  . COLON RESECTION  1992  . COLON SURGERY    . CORONARY ANGIOPLASTY WITH STENT PLACEMENT  12/15/11   "2"  . CORONARY ANGIOPLASTY WITH STENT PLACEMENT  y/2013   "1; makes total of 3" (05/02/2012)  . CORONARY ARTERY BYPASS GRAFT  06/13/2012   Procedure: CORONARY ARTERY BYPASS GRAFTING (CABG);  Surgeon: Grace Isaac, MD;  Location: [place];  Service: Open Heart Surgery;  Laterality: N/A;  cabg x four;  using left internal mammary artery, and left leg greater saphenous vein harvested endoscopically  . CORONARY STENT INTERVENTION N/A 10/13/2016   Procedure: Coronary Stent Intervention;  Surgeon: Troy Sine, MD;  Location: [place] CV LAB;  Service: Cardiovascular;  Laterality: N/A;  . DIALYSIS/PERMA CATHETER REMOVAL N/A 04/18/2017   Procedure: DIALYSIS/PERMA  CATHETER REMOVAL;  Surgeon: Katha Cabal, MD;  Location: [place] CV LAB;  Service: Cardiovascular;  Laterality: N/A;  . DILATION AND CURETTAGE OF UTERUS    . ESOPHAGOGASTRODUODENOSCOPY  01/20/2012   Procedure: ESOPHAGOGASTRODUODENOSCOPY (EGD);  Surgeon: Ladene Artist, MD,FACG;  Location: [facility] ENDOSCOPY;  Service: Endoscopy;  Laterality: N/A;  . ESOPHAGOGASTRODUODENOSCOPY N/A 03/26/2013   Procedure: ESOPHAGOGASTRODUODENOSCOPY (EGD);  Surgeon: Irene Shipper, MD;  Location: [facility] ENDOSCOPY;  Service: Endoscopy;  Laterality: N/A;  . ESOPHAGOGASTRODUODENOSCOPY N/A 04/30/2015   Procedure: ESOPHAGOGASTRODUODENOSCOPY (EGD);  Surgeon: Rogene Houston, MD;  [facility]O [facility];  Service: Endoscopy;  Laterality: N/A;  1pm - moved to 10/20 @ 1:10  . ESOPHAGOGASTRODUODENOSCOPY N/A 07/29/2016   Procedure: ESOPHAGOGASTRODUODENOSCOPY (EGD);  Surgeon: Manus Gunning, MD;  Location: [place];  Service:  Gastroenterology;  Laterality: N/A;  enteroscopy  . INTRAOPERATIVE TRANSESOPHAGEAL ECHOCARDIOGRAM  06/13/2012   Procedure: INTRAOPERATIVE TRANSESOPHAGEAL ECHOCARDIOGRAM;  Surgeon: Grace Isaac, MD;  Location: [place];  Service: Open Heart Surgery;  Laterality: N/A;  . IR GENERIC HISTORICAL  07/26/2016   IR FLUORO GUIDE CV LINE RIGHT 07/26/2016 Greggory Keen, MD [facility]-INTERV RAD  . IR GENERIC HISTORICAL  07/26/2016   IR US GUIDE VASC ACCESS RIGHT 07/26/2016 Greggory Keen, MD [facility]  . IR GENERIC HISTORICAL  08/02/2016   IR US GUIDE VASC ACCESS RIGHT 08/02/2016 Greggory Keen, MD [facility]  . IR GENERIC HISTORICAL  08/02/2016   IR FLUORO GUIDE CV LINE RIGHT 08/02/2016 Greggory Keen, MD [facility]  . IR RADIOLOGY PERIPHERAL GUIDED IV START  03/28/2017  . IR US GUIDE VASC ACCESS RIGHT  03/28/2017  . LEFT HEART CATH AND CORONARY ANGIOGRAPHY N/A 09/20/2016   Procedure: Left Heart Cath and Coronary Angiography;  Surgeon: [name], MD;  Location: [place] CV LAB;  Service: Cardiovascular;  Laterality: N/A;  . LEFT HEART CATH AND CORS/GRAFTS ANGIOGRAPHY N/A 10/13/2016   Procedure: Left Heart Cath and Cors/Grafts Angiography;  Surgeon: Troy Sine, MD;  Location: [place] CV LAB;  Service: Cardiovascular;  Laterality: N/A;  . LEFT HEART CATHETERIZATION WITH CORONARY ANGIOGRAM N/A 12/15/2011   Procedure: LEFT HEART CATHETERIZATION WITH CORONARY ANGIOGRAM;  Surgeon: Burnell Blanks, MD;  Location: [facility] CATH LAB;  Service: Cardiovascular;  Laterality: N/A;  . LEFT HEART CATHETERIZATION WITH CORONARY ANGIOGRAM N/A 01/10/2012   Procedure: LEFT HEART CATHETERIZATION WITH CORONARY ANGIOGRAM;  Surgeon: Peter M Martinique, MD;  Location: [facility] CATH LAB;  Service: Cardiovascular;  Laterality: N/A;  . LEFT HEART CATHETERIZATION WITH CORONARY ANGIOGRAM N/A 06/08/2012   Procedure: LEFT HEART CATHETERIZATION WITH CORONARY ANGIOGRAM;  Surgeon: Burnell Blanks, MD;  Location: [facility] CATH LAB;  Service:  Cardiovascular;  Laterality: N/A;  . LEFT HEART CATHETERIZATION WITH CORONARY/GRAFT ANGIOGRAM N/A 12/10/2013   Procedure: LEFT HEART CATHETERIZATION WITH Beatrix Fetters;  Surgeon: [name], MD;  Location: [facility] CATH LAB;  Service: Cardiovascular;  Laterality: N/A;  . OVARY SURGERY     ovarian cancer  . REVISION OF ARTERIOVENOUS GORETEX GRAFT N/A 02/24/2017   Procedure: REVISION OF ARTERIOVENOUS GORETEX GRAFT (RESECTION);  Surgeon: Katha Cabal, MD;  Location: [facility] ORS;  Service: Vascular;  Laterality: N/A;  Earney Mallet N/A 10/15/2013   Procedure: Fistulogram;  Surgeon: Serafina Mitchell, MD;  Location: [facility] CATH LAB;  Service: Cardiovascular;  Laterality: N/A;  . THROMBECTOMY / ARTERIOVENOUS GRAFT REVISION  2011   left upper arm  . TUBAL LIGATION  1980's  . UPPER EXTREMITY ANGIOGRAPHY Bilateral 12/06/2016   Procedure: Upper  Extremity Angiography;  Surgeon: Katha Cabal, MD;  Location: [place] CV LAB;  Service: Cardiovascular;  Laterality: Bilateral;  . UPPER EXTREMITY INTERVENTION Left 06/06/2017   Procedure: UPPER EXTREMITY INTERVENTION;  Surgeon: Katha Cabal, MD;  Location: [place] CV LAB;  Service: Cardiovascular;  Laterality: Left;    OB History    Gravida Para Term Preterm AB Living   2 2   2   2    SAB TAB Ectopic Multiple Live Births                   Home Medications    Prior to Admission medications   Medication Sig Start Date End Date Taking? Authorizing Provider  ALPRAZolam (XANAX) 0.25 MG tablet Take 1 tablet (0.25 mg total) by mouth 2 (two) times daily as needed for anxiety or sleep. 04/05/17   Arrien, Jimmy Picket, MD  amiodarone (PACERONE) 200 MG tablet Take 1 tablet (200 mg total) by mouth daily. 12/08/16   Herminio Commons, MD  aspirin EC 81 MG tablet Take 81 mg by mouth daily.     [provider]  carvedilol (COREG) 6.25 MG tablet TAKE 1 TABLET BY MOUTH TWICE DAILY WITH  A  MEAL Patient taking differently: TAKE  1 TABLET (6.25 MG) BY MOUTH TWICE DAILY WITH  A  MEAL 06/15/17   Herminio Commons, MD  cinacalcet (SENSIPAR) 30 MG tablet Take 30 mg by mouth daily after supper.    [provider]  cloNIDine (CATAPRES) 0.2 MG tablet Take 0.2 mg by mouth daily as needed (high blood pressure).     [provider]  diphenhydrAMINE (BENADRYL) 25 mg capsule Take 50 mg by mouth daily as needed for itching or allergies.     [provider]  epoetin alfa (EPOGEN,PROCRIT) 78588 UNIT/ML injection Inject 1 mL (10,000 Units total) into the vein every Monday, Wednesday, and Friday with hemodialysis. 04/11/16   Orvan Falconer, MD  fluticasone (FLONASE) 50 MCG/ACT nasal spray Place 1 spray at bedtime as needed into both nostrils for allergies.     [provider]  hydrALAZINE (APRESOLINE) 50 MG tablet Take 50 mg by mouth 2 (two) times daily as needed (high blood pressure).    [provider]  isosorbide mononitrate (IMDUR) 120 MG 24 hr tablet Take 1 tablet (120 mg total) by mouth daily. 05/11/17   Herminio Commons, MD  lidocaine-prilocaine (EMLA) cream Apply 1 application topically every Monday, Wednesday, and Friday. Prior to dialysis    [provider]  multivitamin (RENA-VIT) TABS tablet Take 1 tablet by mouth daily.    [provider]  nitroGLYCERIN (NITROSTAT) 0.4 MG SL tablet Place 1 tablet (0.4 mg total) under the tongue every 5 (five) minutes as needed for chest pain. 06/05/17   Herminio Commons, MD  nystatin-triamcinolone (MYCOLOG II) cream Apply 1 application topically 2 (two) times daily. Patient taking differently: Apply 1 application topically 2 (two) times daily as needed (itching/rash).  05/30/17   Rehman, Mechele Dawley, MD  omeprazole (PRILOSEC) 20 MG capsule Take 20 mg by mouth daily.    [provider]  oxycodone (OXY-IR) 5 MG capsule Take 1 capsule (5 mg total) by mouth every 6 (six) hours as needed for pain. Patient not taking: Reported on  07/01/2017 04/05/17   Arrien, Jimmy Picket, MD  sevelamer carbonate (RENVELA) 800 MG tablet Take 800-2,400 mg by mouth See admin instructions. Take 3 tablets (2400) by mouth twice daily with meals, take 1 tablet (800  mg) with snacks    [provider]  simvastatin (ZOCOR) 20 MG tablet TAKE ONE TABLET BY MOUTH AT BEDTIME Patient taking differently: TAKE ONE TABLET (20 MG) BY MOUTH DAILY AT BEDTIME 02/13/17   Herminio Commons, MD  ticagrelor (BRILINTA) 90 MG TABS tablet Take 90 mg by mouth 2 (two) times daily.    [provider]    Family History Family History  Problem Relation Age of Onset  . Heart disease Mother        Heart Disease before age 34  . Hyperlipidemia Mother   . Hypertension Mother   . Diabetes Mother   . Heart attack Mother   . Heart disease Father        Heart Disease before age 32  . Hyperlipidemia Father   . Hypertension Father   . Diabetes Father   . Diabetes Sister   . Hypertension Sister   . Diabetes Brother   . Hyperlipidemia Brother   . Heart attack Brother   . Hypertension Sister   . Heart attack Brother   . Other Unknown        noncontributory for early CAD  . Colon cancer Neg Hx   . Esophageal cancer Neg Hx   . Liver disease Neg Hx   . Kidney disease Neg Hx   . Colon polyps Neg Hx     Social History Social History   Tobacco Use  . Smoking status: Never Smoker  . Smokeless tobacco: Never Used  Substance Use Topics  . Alcohol use: No    Alcohol/week: 0.0 oz  . Drug use: No     Allergies   Aspirin; Penicillins; Amlodipine; Bactrim [sulfamethoxazole-trimethoprim]; Contrast media [iodinated diagnostic agents]; Iron; Nitrofurantoin; Tylenol [acetaminophen]; Gabapentin; Dexilant [dexlansoprazole]; Levaquin [levofloxacin in d5w]; Morphine and related; Plavix [clopidogrel bisulfate]; Protonix [pantoprazole sodium]; and Venofer [ferric oxide]   Review of Systems Review of Systems  Constitutional: Negative for fever.    Respiratory: Negative for shortness of breath.   Gastrointestinal: Positive for abdominal pain and vomiting.  All other systems reviewed and are negative.    Physical Exam Updated Vital Signs BP (!) 180/58   Pulse 78   Temp 98.7 F (37.1 C) (Oral)   Resp 15   Ht 1.549 m (5\' 1" )   Wt 56.7 kg (125 lb)   SpO2 92%   BMI 23.62 kg/m   Physical Exam CONSTITUTIONAL: Well developed/well nourished HEAD: Normocephalic/atraumatic EYES: EOMI ENMT: Mucous membranes moist NECK: supple no meningeal signs SPINE/BACK:entire spine nontender CV: S1/S2 noted, no murmurs/rubs/gallops noted LUNGS: Lungs are clear to auscultation bilaterally, no apparent distress ABDOMEN: soft, nontender, no rebound or guarding, bowel sounds noted throughout abdomen GU:no cva tenderness NEURO: Pt is awake/alert/appropriate, moves all extremitiesx4.  No facial droop.   EXTREMITIES: pulses normal/equal, full ROM, dialysis access to left arm, thrill noted SKIN: warm, color normal PSYCH: no abnormalities of mood noted, alert and oriented to situation  ED Treatments / Results  Labs (all labs ordered are listed, but only abnormal results are displayed) Labs Reviewed  COMPREHENSIVE METABOLIC PANEL - Abnormal; Notable for the following components:      Result Value   Chloride 95 (*)    Glucose, Bld 109 (*)    BUN 49 (*)    Creatinine, Ser 8.06 (*)    Total Protein 6.4 (*)    GFR calc non Af Amer 4 (*)    GFR calc Af Amer 5 (*)    All other components within normal limits  CBC - Abnormal; Notable for the following components:   RBC 2.44 (*)    Hemoglobin 7.9 (*)    HCT 25.3 (*)    MCV 103.7 (*)    RDW 17.7 (*)    All other components within normal limits  TROPONIN I - Abnormal; Notable for the following components:   Troponin I 0.06 (*)    All other components within normal limits  POC OCCULT BLOOD, ED - Abnormal; Notable for the following components:   Fecal Occult Bld POSITIVE (*)    All other  components within normal limits  LIPASE, BLOOD  TYPE AND SCREEN    EKG ED ECG REPORT   Date: 07/14/2017 0155  Rate: 82  Rhythm: normal sinus rhythm  QRS Axis: normal  Intervals: normal  ST/T Wave abnormalities: nonspecific ST changes  Conduction Disutrbances:nonspecific intraventricular conduction delay  Narrative Interpretation:   Old EKG Reviewed: unchanged LVH with repolarization noted I have personally reviewed the EKG tracing and agree with the computerized printout as noted.  Radiology Dg Abdomen Acute W/chest  Result Date: 07/14/2017 CLINICAL DATA:  Initial evaluation for acute vomiting, mid and right abdominal pain. EXAM: DG ABDOMEN ACUTE W/ 1V CHEST COMPARISON:  Prior radiograph from 07/02/2017 and CT from 03/28/2017. FINDINGS: Median sternotomy wires underlying CABG markers and coronary stent. Heart size within normal limits. Mediastinal silhouette normal. Aortic atherosclerosis. Lungs mildly hyperinflated. Left perihilar and bibasilar atelectasis ache and/or scarring. No focal infiltrates. No pulmonary edema or pleural effusion. No pneumothorax. Bowel gas pattern within normal limits without obstruction or ileus. Scattered retained contrast present throughout the colon. No free air. Multiple surgical clips with suture material overlie the lower mid abdomen and pelvis. No appreciable soft tissue mass or abnormal calcification Degenerative changes noted within the lower lumbar spine about the hips bilaterally. IMPRESSION: 1. Nonobstructive bowel gas pattern with no radiographic evidence for acute intra-abdominal pathology. Retained contrast material throughout the colon. 2. No active cardiopulmonary disease. Electronically Signed   By: Jeannine Boga M.D.   On: 07/14/2017 05:04    Procedures Procedures (including critical care time)  Medications Ordered in ED Medications  ondansetron (ZOFRAN-ODT) disintegrating tablet 8 mg (8 mg Oral Not Given 07/14/17 0401)     Initial  Impression / Assessment and Plan / ED Course  I have reviewed the triage vital signs and the nursing notes.  Pertinent labs & imaging results that were available during my care of the patient were reviewed by me and considered in my medical decision making (see chart for details).     4:54 AM Pt with worsening anemia, will need hemoccult 6:29 AM HemOccult positive. When reviewing labs, this is the worst of her anemia over the past several months Reviewing her chart, reveals GI is worked this up previously, they felt may be due to small bowel ulcers or AVMs. She has had an acute drop since late December She would need to be admitted for further monitoring,as she may end up needing blood transfusion Otherwise, patient is awake alert no distress no focal abdominal tenderness Discussed with hospitalist for admission  Final Clinical Impressions(s) / ED Diagnoses   Final diagnoses:  Acute anemia  ESRD (end stage renal disease) [facility])    ED Discharge Orders    None       Ripley Fraise, MD 07/14/17 [phone]

## 2017-07-14 NOTE — Progress Notes (Signed)
77 year old female with history of ESRD on HD, chronic diastolic heart failure, CAD, iron deficiency anemia, history of lower GI bleeding with noted colon AVMs who presented to emergency department with emesis and worsening abdominal pain.  She is noted to have worsening anemia with hemoglobin of 7.9 and noted baseline of 10 over several months.  Hemoccult is positive.  No overt bleeding noted.  ED physician recommends observation overnight to ensure H&H is stable and see if transfusion is warranted. Also to make sure N/V and abdominal pain improved. Plans for hemodialysis today and will require nephrology consultation.

## 2017-07-14 NOTE — H&P (Signed)
History and Physical  ADDALYNN KUMARI CXK:[id number] DOB: 1941-05-03 DOA: 07/14/2017  Referring physician: [name]  PCP: [name], MD   Chief Complaint: vomiting  HPI: Shawna [name] is a 77 y.o. female presented to ED complaining of vomiting and severe abdominal pain involving the mid and right lower abdomen that started about 2 hours prior to admission.  The patient has a history of ESRD on hemodialysis, CAD s/p CABG and recent admissions for GI bleeding and was diagnosed with AVMs.  Also had EGD 9/18 that confirmed nonbleeding duodenal AVM requiring ablation. She had a repeat EGD showed an AVM in prox jejunum which was ablated.  The patient was recently admitted for abdominal pain and found to have mesenteric ischemia and abdominal abscesses that were treated conservatively with bowel rest and IV antibiotics.  Pt says she has been feeling her normal self until early this morning when her symptoms began.  She was noted to have a Hg of 7.9 which is a sharp drop from when it was tested on 07/01/17 where it 10.6.  The patient denies having chest pain and SOB.   She denies hematochezia but says that she did have melena approximately 1 week ago but it resolved on its own.    ED Course:  Pt was seen in ED and was noted to have a troponin of 0.06 and Hg of 7.9.  She is due for hemodialysis today.   She is being admitted for observation given the drop in hemoglobin. She was noted to be hemoccult positive.  She is ordered for 2 units of PRBC during hemodialysis today.   Review of Systems: All systems reviewed and apart from history of presenting illness, are negative.  Past Medical History:  Diagnosis Date  . Acute on chronic respiratory failure with hypoxia ([place]) 10/10/2016  . Anxiety   . Arthritis   . AVM (arteriovenous malformation) of colon   . CAD (coronary artery disease)    a. 12/2011 NSTEMI/Cath/PCI LCX (2.25x14 Resolute DES) & D1 (2.25x22 Resolute DES);  b. 01/2012 Cath/PCI: LM 30, LAD 30p,  40-26m, D1 stent ok, 99 in sm branch of diag, LCX patent stent, OM1 20, RCA 95 ost (4.0x12 Promus DES), EF 55%;  c. 04/2012 Lexi Cardiolite  EF 48%, small area of scar @ base/mid inflat wall with mild peri-infarct ischemia.; CABG 12/4  . Carotid artery disease ([place])    a. 97-02% LICA, 12/3783   . Chronic bronchitis ([place])   . Chronic diastolic CHF (congestive heart failure) ([place])    a. 02/2012 Echo EF 60-65%, nl wall motion, Gr 1 DD, mod MR  . Colon cancer ([place]) 1992  . Esophageal stricture   . ESRD on hemodialysis ([place])    ESRD due to HTN, started dialysis 2011 and gets HD at [facility] with Dr Hinda Lenis on MWF schedule.  Access is LUA AVF as of Sept 2014.   Marland Kitchen GERD (gastroesophageal reflux disease)   . High cholesterol 12/2011  . History of blood transfusion 07/2011; 12/2011; 01/2012 X 2; 04/2012  . History of gout   . History of lower GI bleeding   . Hypertension   . Iron deficiency anemia   . Mitral regurgitation    a. Moderate by echo, 02/2012  . Myocardial infarction ([place])   . Ovarian cancer ([place]) 1992  . Pneumonia ~ 2009  . PUD (peptic ulcer disease)   . TIA (transient ischemic attack)    Past Surgical History:  Procedure Laterality Date  .  ABDOMINAL HYSTERECTOMY  1992  . APPENDECTOMY  06/1990  . AV FISTULA PLACEMENT  07/2009   left upper arm  . AV FISTULA PLACEMENT Right 09/06/2016   Procedure: RIGHT FOREARM ARTERIOVENOUS (AV) GRAFT;  Surgeon: Elam Dutch, MD;  Location: [facility] OR;  Service: Vascular;  Laterality: Right;  . AV FISTULA PLACEMENT N/A 02/24/2017   Procedure: INSERTION OF ARTERIOVENOUS (AV) GORE-TEX GRAFT ARM (BRACHIAL AXILLARY);  Surgeon: Katha Cabal, MD;  Location: [facility] ORS;  Service: Vascular;  Laterality: N/A;  . [place] Right 09/06/2016   Procedure: REMOVAL OF Right Arm ARTERIOVENOUS GORETEX GRAFT and Vein Patch angioplasty of brachial artery;  Surgeon: Angelia Mould, MD;  Location: [place];  Service: Vascular;  Laterality: Right;  . COLON  RESECTION  1992  . COLON SURGERY    . CORONARY ANGIOPLASTY WITH STENT PLACEMENT  12/15/11   "2"  . CORONARY ANGIOPLASTY WITH STENT PLACEMENT  y/2013   "1; makes total of 3" (05/02/2012)  . CORONARY ARTERY BYPASS GRAFT  06/13/2012   Procedure: CORONARY ARTERY BYPASS GRAFTING (CABG);  Surgeon: Grace Isaac, MD;  Location: [place];  Service: Open Heart Surgery;  Laterality: N/A;  cabg x four;  using left internal mammary artery, and left leg greater saphenous vein harvested endoscopically  . CORONARY STENT INTERVENTION N/A 10/13/2016   Procedure: Coronary Stent Intervention;  Surgeon: Troy Sine, MD;  Location: [place] CV LAB;  Service: Cardiovascular;  Laterality: N/A;  . DIALYSIS/PERMA CATHETER REMOVAL N/A 04/18/2017   Procedure: DIALYSIS/PERMA CATHETER REMOVAL;  Surgeon: Katha Cabal, MD;  Location: [place] CV LAB;  Service: Cardiovascular;  Laterality: N/A;  . DILATION AND CURETTAGE OF UTERUS    . ESOPHAGOGASTRODUODENOSCOPY  01/20/2012   Procedure: ESOPHAGOGASTRODUODENOSCOPY (EGD);  Surgeon: Ladene Artist, MD,FACG;  Location: [facility] ENDOSCOPY;  Service: Endoscopy;  Laterality: N/A;  . ESOPHAGOGASTRODUODENOSCOPY N/A 03/26/2013   Procedure: ESOPHAGOGASTRODUODENOSCOPY (EGD);  Surgeon: Irene Shipper, MD;  Location: [facility] ENDOSCOPY;  Service: Endoscopy;  Laterality: N/A;  . ESOPHAGOGASTRODUODENOSCOPY N/A 04/30/2015   Procedure: ESOPHAGOGASTRODUODENOSCOPY (EGD);  Surgeon: Rogene Houston, MD;  [facility];  Service: Endoscopy;  Laterality: N/A;  1pm - moved to 10/20 @ 1:10  . ESOPHAGOGASTRODUODENOSCOPY N/A 07/29/2016   Procedure: ESOPHAGOGASTRODUODENOSCOPY (EGD);  Surgeon: Manus Gunning, MD;  Location: [place];  Service: Gastroenterology;  Laterality: N/A;  enteroscopy  . INTRAOPERATIVE TRANSESOPHAGEAL ECHOCARDIOGRAM  06/13/2012   Procedure: INTRAOPERATIVE TRANSESOPHAGEAL ECHOCARDIOGRAM;  Surgeon: Grace Isaac, MD;  Location: [place];  Service: Open Heart Surgery;   Laterality: N/A;  . IR GENERIC HISTORICAL  07/26/2016   IR FLUORO GUIDE CV LINE RIGHT 07/26/2016 Greggory Keen, MD [facility]  . IR GENERIC HISTORICAL  07/26/2016   IR US GUIDE VASC ACCESS RIGHT 07/26/2016 Greggory Keen, MD [facility]  . IR GENERIC HISTORICAL  08/02/2016   IR US GUIDE VASC ACCESS RIGHT 08/02/2016 Greggory Keen, MD [facility]  . IR GENERIC HISTORICAL  08/02/2016   IR FLUORO GUIDE CV LINE RIGHT 08/02/2016 Greggory Keen, MD [facility]  . IR RADIOLOGY PERIPHERAL GUIDED IV START  03/28/2017  . IR US GUIDE VASC ACCESS RIGHT  03/28/2017  . LEFT HEART CATH AND CORONARY ANGIOGRAPHY N/A 09/20/2016   Procedure: Left Heart Cath and Coronary Angiography;  Surgeon: [name], MD;  Location: [place] CV LAB;  Service: Cardiovascular;  Laterality: N/A;  . LEFT HEART CATH AND CORS/GRAFTS ANGIOGRAPHY N/A 10/13/2016   Procedure: Left Heart Cath and Cors/Grafts Angiography;  Surgeon:  Troy Sine, MD;  Location: [place] CV LAB;  Service: Cardiovascular;  Laterality: N/A;  . LEFT HEART CATHETERIZATION WITH CORONARY ANGIOGRAM N/A 12/15/2011   Procedure: LEFT HEART CATHETERIZATION WITH CORONARY ANGIOGRAM;  Surgeon: Burnell Blanks, MD;  Location: [facility] CATH LAB;  Service: Cardiovascular;  Laterality: N/A;  . LEFT HEART CATHETERIZATION WITH CORONARY ANGIOGRAM N/A 01/10/2012   Procedure: LEFT HEART CATHETERIZATION WITH CORONARY ANGIOGRAM;  Surgeon: Peter M Martinique, MD;  Location: [facility] CATH LAB;  Service: Cardiovascular;  Laterality: N/A;  . LEFT HEART CATHETERIZATION WITH CORONARY ANGIOGRAM N/A 06/08/2012   Procedure: LEFT HEART CATHETERIZATION WITH CORONARY ANGIOGRAM;  Surgeon: Burnell Blanks, MD;  Location: [facility] CATH LAB;  Service: Cardiovascular;  Laterality: N/A;  . LEFT HEART CATHETERIZATION WITH CORONARY/GRAFT ANGIOGRAM N/A 12/10/2013   Procedure: LEFT HEART CATHETERIZATION WITH Beatrix Fetters;  Surgeon: [name], MD;  Location: [facility] CATH LAB;  Service:  Cardiovascular;  Laterality: N/A;  . OVARY SURGERY     ovarian cancer  . REVISION OF ARTERIOVENOUS GORETEX GRAFT N/A 02/24/2017   Procedure: REVISION OF ARTERIOVENOUS GORETEX GRAFT (RESECTION);  Surgeon: Katha Cabal, MD;  Location: [facility] ORS;  Service: Vascular;  Laterality: N/A;  Earney Mallet N/A 10/15/2013   Procedure: Fistulogram;  Surgeon: Serafina Mitchell, MD;  Location: [facility] CATH LAB;  Service: Cardiovascular;  Laterality: N/A;  . THROMBECTOMY / ARTERIOVENOUS GRAFT REVISION  2011   left upper arm  . TUBAL LIGATION  1980's  . UPPER EXTREMITY ANGIOGRAPHY Bilateral 12/06/2016   Procedure: Upper Extremity Angiography;  Surgeon: Katha Cabal, MD;  Location: [place] CV LAB;  Service: Cardiovascular;  Laterality: Bilateral;  . UPPER EXTREMITY INTERVENTION Left 06/06/2017   Procedure: UPPER EXTREMITY INTERVENTION;  Surgeon: Katha Cabal, MD;  Location: [place] CV LAB;  Service: Cardiovascular;  Laterality: Left;   Social History:  reports that  has never smoked. she has never used smokeless tobacco. She reports that she does not drink alcohol or use drugs.  Allergies  Allergen Reactions  . Aspirin Other (See Comments)    High Doses Mess up her stomach; "makes my bowels have blood in them". Takes 81 mg EC Aspirin   . Penicillins Other (See Comments)    SYNCOPE? , "makes me real weak when I take it; like I'll pass out"  Has patient had a PCN reaction causing immediate rash, facial/tongue/throat swelling, SOB or lightheadedness with hypotension: Yes Has patient had a PCN reaction causing severe rash involving mucus membranes or skin necrosis: no Has patient had a PCN reaction that required hospitalization no Has patient had a PCN reaction occurring within the last 10 years: no If all of the above  . Amlodipine Swelling  . Bactrim [Sulfamethoxazole-Trimethoprim] Rash  . Contrast Media [Iodinated Diagnostic Agents] Itching  . Iron Itching and Other (See Comments)     "they gave me iron in dialysis; had to give me Benadryl cause I had to have the iron" (05/02/2012)  . Nitrofurantoin Hives  . Tylenol [Acetaminophen] Itching and Other (See Comments)    Makes her feet on fire per pt  . Gabapentin Other (See Comments)    Unknown reaction  . Dexilant [Dexlansoprazole] Other (See Comments)    Upset stomach  . Levaquin [Levofloxacin In D5w] Rash  . Morphine And Related Itching and Other (See Comments)    Itching in feet  . Plavix [Clopidogrel Bisulfate] Rash  . Protonix [Pantoprazole Sodium] Rash  . Venofer [Ferric Oxide] Itching and Other (See Comments)  Patient reports using Benadryl prior to doses as [facility]    Family History  Problem Relation Age of Onset  . Heart disease Mother        Heart Disease before age 81  . Hyperlipidemia Mother   . Hypertension Mother   . Diabetes Mother   . Heart attack Mother   . Heart disease Father        Heart Disease before age 61  . Hyperlipidemia Father   . Hypertension Father   . Diabetes Father   . Diabetes Sister   . Hypertension Sister   . Diabetes Brother   . Hyperlipidemia Brother   . Heart attack Brother   . Hypertension Sister   . Heart attack Brother   . Other Unknown        noncontributory for early CAD  . Colon cancer Neg Hx   . Esophageal cancer Neg Hx   . Liver disease Neg Hx   . Kidney disease Neg Hx   . Colon polyps Neg Hx     Prior to Admission medications   Medication Sig Start Date End Date Taking? Authorizing Provider  ALPRAZolam (XANAX) 0.25 MG tablet Take 1 tablet (0.25 mg total) by mouth 2 (two) times daily as needed for anxiety or sleep. 04/05/17   Arrien, Jimmy Picket, MD  amiodarone (PACERONE) 200 MG tablet Take 1 tablet (200 mg total) by mouth daily. 12/08/16   Herminio Commons, MD  aspirin EC 81 MG tablet Take 81 mg by mouth daily.     [provider]  carvedilol (COREG) 6.25 MG tablet TAKE 1 TABLET BY MOUTH TWICE DAILY WITH  A  MEAL Patient taking  differently: TAKE 1 TABLET (6.25 MG) BY MOUTH TWICE DAILY WITH  A  MEAL 06/15/17   Herminio Commons, MD  cinacalcet (SENSIPAR) 30 MG tablet Take 30 mg by mouth daily after supper.    [provider]  cloNIDine (CATAPRES) 0.2 MG tablet Take 0.2 mg by mouth daily as needed (high blood pressure).     [provider]  diphenhydrAMINE (BENADRYL) 25 mg capsule Take 50 mg by mouth daily as needed for itching or allergies.     [provider]  epoetin alfa (EPOGEN,PROCRIT) 44034 UNIT/ML injection Inject 1 mL (10,000 Units total) into the vein every Monday, Wednesday, and Friday with hemodialysis. 04/11/16   Orvan Falconer, MD  fluticasone (FLONASE) 50 MCG/ACT nasal spray Place 1 spray at bedtime as needed into both nostrils for allergies.     [provider]  hydrALAZINE (APRESOLINE) 50 MG tablet Take 50 mg by mouth 2 (two) times daily as needed (high blood pressure).    [provider]  isosorbide mononitrate (IMDUR) 120 MG 24 hr tablet Take 1 tablet (120 mg total) by mouth daily. 05/11/17   Herminio Commons, MD  lidocaine-prilocaine (EMLA) cream Apply 1 application topically every Monday, Wednesday, and Friday. Prior to dialysis    [provider]  multivitamin (RENA-VIT) TABS tablet Take 1 tablet by mouth daily.    [provider]  nitroGLYCERIN (NITROSTAT) 0.4 MG SL tablet Place 1 tablet (0.4 mg total) under the tongue every 5 (five) minutes as needed for chest pain. 06/05/17   Herminio Commons, MD  nystatin-triamcinolone (MYCOLOG II) cream Apply 1 application topically 2 (two) times daily. Patient taking differently: Apply 1 application topically 2 (two) times daily as needed (itching/rash).  05/30/17   Rogene Houston, MD  omeprazole (PRILOSEC) 20 MG capsule Take  20 mg by mouth daily.    [provider]  oxycodone (OXY-IR) 5 MG capsule Take 1 capsule (5 mg total) by mouth every 6 (six) hours as needed for pain. Patient not  taking: Reported on 07/01/2017 04/05/17   Arrien, Jimmy Picket, MD  sevelamer carbonate (RENVELA) 800 MG tablet Take 800-2,400 mg by mouth See admin instructions. Take 3 tablets (2400) by mouth twice daily with meals, take 1 tablet (800 mg) with snacks    [provider]  simvastatin (ZOCOR) 20 MG tablet TAKE ONE TABLET BY MOUTH AT BEDTIME Patient taking differently: TAKE ONE TABLET (20 MG) BY MOUTH DAILY AT BEDTIME 02/13/17   Herminio Commons, MD  ticagrelor (BRILINTA) 90 MG TABS tablet Take 90 mg by mouth 2 (two) times daily.    [provider]   Physical Exam: Vitals:   07/14/17 0330 07/14/17 0400 07/14/17 0430 07/14/17 0530  BP: (!) 185/58 (!) 199/80 (!) 180/58 (!) 179/57  Pulse: 77 89 78 80  Resp: 10 (!) 21 15 12   Temp:      TempSrc:      SpO2: 96% 97% 92% 95%  Weight:      Height:         General exam: Moderately built and nourished patient, lying comfortably supine on the gurney in no obvious distress.  Head, eyes and ENT: Nontraumatic and normocephalic. Pupils equally reacting to light and accommodation. Oral mucosa moist.  Neck: Supple. No JVD, carotid bruit or thyromegaly.  Lymphatics: No lymphadenopathy.  Respiratory system: Clear to auscultation. No increased work of breathing.  Cardiovascular system: S1 and S2 heard, RRR. No JVD, murmurs, gallops, clicks or pedal edema.  Gastrointestinal system: Abdomen is tender. Normal bowel sounds heard. No organomegaly or masses appreciated.  Central nervous system: Alert and oriented. No focal neurological deficits.  Extremities: Symmetric 5 x 5 power. Peripheral pulses symmetrically felt.   Skin: No rashes or acute findings.  Musculoskeletal system: Negative exam.  Psychiatry: Pleasant and cooperative.  Labs on Admission:  Basic Metabolic Panel: Recent Labs  Lab 07/14/17 0225  NA 138  K 4.9  CL 95*  CO2 28  GLUCOSE 109*  BUN 49*  CREATININE 8.06*  CALCIUM 10.3   Liver Function  Tests: Recent Labs  Lab 07/14/17 0225  AST 26  ALT 24  ALKPHOS 50  BILITOT 0.8  PROT 6.4*  ALBUMIN 3.5   Recent Labs  Lab 07/14/17 0225  LIPASE 40   No results for input(s): AMMONIA in the last 168 hours. CBC: Recent Labs  Lab 07/14/17 0225  WBC 8.2  HGB 7.9*  HCT 25.3*  MCV 103.7*  PLT 184   Cardiac Enzymes: Recent Labs  Lab 07/14/17 0225  TROPONINI 0.06*    BNP (last 3 results) No results for input(s): PROBNP in the last 8760 hours. CBG: No results for input(s): GLUCAP in the last 168 hours.  Radiological Exams on Admission: Dg Abdomen Acute W/chest  Result Date: 07/14/2017 CLINICAL DATA:  Initial evaluation for acute vomiting, mid and right abdominal pain. EXAM: DG ABDOMEN ACUTE W/ 1V CHEST COMPARISON:  Prior radiograph from 07/02/2017 and CT from 03/28/2017. FINDINGS: Median sternotomy wires underlying CABG markers and coronary stent. Heart size within normal limits. Mediastinal silhouette normal. Aortic atherosclerosis. Lungs mildly hyperinflated. Left perihilar and bibasilar atelectasis ache and/or scarring. No focal infiltrates. No pulmonary edema or pleural effusion. No pneumothorax. Bowel gas pattern within normal limits without obstruction or ileus. Scattered retained contrast present throughout the colon. No free  air. Multiple surgical clips with suture material overlie the lower mid abdomen and pelvis. No appreciable soft tissue mass or abnormal calcification Degenerative changes noted within the lower lumbar spine about the hips bilaterally. IMPRESSION: 1. Nonobstructive bowel gas pattern with no radiographic evidence for acute intra-abdominal pathology. Retained contrast material throughout the colon. 2. No active cardiopulmonary disease. Electronically Signed   By: Jeannine Boga M.D.   On: 07/14/2017 05:04    EKG: Independently reviewed. No acute findings  Assessment/Plan Principal Problem:   Acute blood loss anemia Active Problems:   ESRD on  hemodialysis (HCC)   HLD (hyperlipidemia)   GERD (gastroesophageal reflux disease)   AVM (arteriovenous malformation) of small bowel, acquired (HCC)   Hx of CABG   Carotid artery disease (HCC)   GI bleeding   Essential hypertension   History of colon cancer   History of ovarian cancer   Heme positive stool   Mesenteric ischemia (HCC)   Coronary artery disease of bypass graft of native heart with stable angina pectoris (HCC)   Anemia  1. Acute blood loss anemia - Admit for observation, transfuse 2 units PRBC during hemodialysis today.  Heme positive stools are concerning for ongoing GI bleeding.  With history of AVMs requiring treatment, will ask for GI evaluation.   2. Abdominal Pain - Pt says that pain was sudden and severe but has resolved now. She is asking to eat again.  Nausea has improved with medication.  Monitor for recurrent pain.  If it recurs, consider CTA abdomen given her history of mesenteric ischemia. Supportive care for now. Trial of clear liquids, advance as tolerated.  3. ESRD on hemodialysis - consult nephrology team for hemodialysis today.  Will ask to transfuse blood during hemodialysis.  4. CAD s/p CABG - denies chest pain symptoms, follow troponin.  5. Essential Hypertension - resume home blood pressure medications when we can get them reconciled.  6. History of AVMs - GI eval pending.  7. History of mesenteric ischemia - abd pain resolved now, follow clinically, continue supportive care.    DVT Prophylaxis: SCDs Code Status: Full   Family Communication: husband at bedside  Disposition Plan: Home in a couple of days   Time spent: 48 mins  Irwin Brakeman, MD [facility] Pager [phone]  If 7PM-7AM, please contact night-coverage [email or web address] Password [facility] 07/14/2017, 7:43 AM

## 2017-07-14 NOTE — Consult Note (Addendum)
Shawna [name] MRN: [id number] DOB/AGE: 16-Feb-1941 77 y.o. Primary Care Physician:Howard, Lennette Bihari, MD Admit date: 07/14/2017 Chief Complaint:  Chief Complaint  Patient presents with  . Emesis   HPI: Pt is 77 year old female withg past medical hx of ESRD who was presented to Er with c/o Emesis  HPI dates back to last night when pt had sudden onset of emesis. Pt also c/o abdominal pain,in middle and on right side Upon evaluation in ER pt was found to have FOBT + and was admitted for further care. Pt seen in ER a she is awating bed on the floor No c/o fever/cough/chiils NO c/o hematemeis No c/o syncope No c/o recent travel. NO c/o blood in stools No c/o hematuria NO c/o black tarry stools. Pt says " my belly is so tender"     Past Medical History:  Diagnosis Date  . Acute on chronic respiratory failure with hypoxia ([place]) 10/10/2016  . Anxiety   . Arthritis   . AVM (arteriovenous malformation) of colon   . CAD (coronary artery disease)    a. 12/2011 NSTEMI/Cath/PCI LCX (2.25x14 Resolute DES) & D1 (2.25x22 Resolute DES);  b. 01/2012 Cath/PCI: LM 30, LAD 30p, 40-40m, D1 stent ok, 99 in sm branch of diag, LCX patent stent, OM1 20, RCA 95 ost (4.0x12 Promus DES), EF 55%;  c. 04/2012 Lexi Cardiolite  EF 48%, small area of scar @ base/mid inflat wall with mild peri-infarct ischemia.; CABG 12/4  . Carotid artery disease ([place])    a. 93-26% LICA, 01/1244   . Chronic bronchitis ([place])   . Chronic diastolic CHF (congestive heart failure) ([place])    a. 02/2012 Echo EF 60-65%, nl wall motion, Gr 1 DD, mod MR  . Colon cancer ([place]) 1992  . Esophageal stricture   . ESRD on hemodialysis ([place])    ESRD due to HTN, started dialysis 2011 and gets HD at [facility] with Dr Hinda Lenis on MWF schedule.  Access is LUA AVF as of Sept 2014.   Marland Kitchen GERD (gastroesophageal reflux disease)   . High cholesterol 12/2011  . History of blood transfusion 07/2011; 12/2011; 01/2012 X 2; 04/2012  . History of gout   . History of  lower GI bleeding   . Hypertension   . Iron deficiency anemia   . Mitral regurgitation    a. Moderate by echo, 02/2012  . Myocardial infarction ([place])   . Ovarian cancer ([name]) 1992  . Pneumonia ~ 2009  . PUD (peptic ulcer disease)   . TIA (transient ischemic attack)       Family hx Reviewed NO hx of ESRD    Social History: denies smoking/drinking/illegal drug usage Lives with her husband.   Allergies:  reviewed       YKD:XIPJA from the symptoms mentioned above,there are no other symptoms referable to all systems reviewed.  . carvedilol  6.25 mg Oral BID WC  . cinacalcet  30 mg Oral QPC supper  . ondansetron  8 mg Oral Once  . sodium chloride flush  3 mL Intravenous Q12H      Physical Exam: Vital signs in last 24 hours: Temp:  [98.7 F (37.1 C)] 98.7 F (37.1 C) (01/03 2223) Pulse Rate:  [77-89] 80 (01/04 0530) Resp:  [10-21] 12 (01/04 0530) BP: (170-199)/(57-80) 179/57 (01/04 0530) SpO2:  [92 %-97 %] 95 % (01/04 0530) Weight:  [125 lb (56.7 kg)] 125 lb (56.7 kg) (01/03 2223) Weight change:     Intake/Output from previous day: No intake/output data  recorded. No intake/output data recorded.   Physical Exam: General- pt is awake,alert, oriented to time place and person Resp- No acute REsp distress, rhonchi  at bases CVS- S1S2 regular in rate and rhythm GIT- BS+, soft, tenderness,non rigid EXT- NO LE Edema, NO Cyanosis CNS- CN 2-12 grossly intact. Moving all 4 extremities Psych- normal mood and affect Access-  Left AVG + bruit  Lab Results: CBC Recent Labs    07/14/17 0225  WBC 8.2  HGB 7.9*  HCT 25.3*  PLT 184    BMET Recent Labs    07/14/17 0225  NA 138  K 4.9  CL 95*  CO2 28  GLUCOSE 109*  BUN 49*  CREATININE 8.06*  CALCIUM 10.3      Lab Results  Component Value Date   PTH 709.7 (H) 05/29/2013   CALCIUM 10.3 07/14/2017   CAION 1.06 (L) 07/26/2016   PHOS 6.0 (H) 03/31/2017         Impression: 1)Renal ESRd on  HD Pt is  On Monday/Wednesday/Friday schedule as outpt Pt will  dialyzed be today  2)HTN  BP  near to goal     3)Anemia in ESRD the goal for  HGb is  9--11. Pt HGb not  at goal Admitted with GI bleed Will keep on Epo  4)CKD Mineral-Bone Disorder Secondary Hyperparathyroidism present    On Cinacalcet Phosphorus not at goal.     On binders Calcium at Goal   5)GI-Admitted with abdominal pain and emesis  GI team and  Primary MD following  6)Electrolytes  normokalemic  normonatremic   7)Acid base Co2 at goal     Plan:  Will use 2 k/2.5 cal  bath Will not give heparin  will keep on epo   BHUTANI,MANPREET S 07/14/2017, 9:06 AM

## 2017-07-14 NOTE — ED Notes (Addendum)
Date and time results received: 07/14/17 0313 (use smartphrase ".now" to insert current time)  Test: Troponin Critical Value: 0.06  Name of Provider Notified: [place]  Orders Received? Or Actions Taken?:

## 2017-07-15 DIAGNOSIS — R195 Other fecal abnormalities: Secondary | ICD-10-CM

## 2017-07-15 DIAGNOSIS — K559 Vascular disorder of intestine, unspecified: Secondary | ICD-10-CM | POA: Diagnosis not present

## 2017-07-15 DIAGNOSIS — Z992 Dependence on renal dialysis: Secondary | ICD-10-CM | POA: Diagnosis not present

## 2017-07-15 DIAGNOSIS — Z8543 Personal history of malignant neoplasm of ovary: Secondary | ICD-10-CM | POA: Diagnosis not present

## 2017-07-15 DIAGNOSIS — D62 Acute posthemorrhagic anemia: Secondary | ICD-10-CM | POA: Diagnosis not present

## 2017-07-15 DIAGNOSIS — K922 Gastrointestinal hemorrhage, unspecified: Secondary | ICD-10-CM | POA: Diagnosis not present

## 2017-07-15 DIAGNOSIS — K558 Other vascular disorders of intestine: Secondary | ICD-10-CM

## 2017-07-15 DIAGNOSIS — N186 End stage renal disease: Secondary | ICD-10-CM | POA: Diagnosis not present

## 2017-07-15 DIAGNOSIS — E782 Mixed hyperlipidemia: Secondary | ICD-10-CM | POA: Diagnosis not present

## 2017-07-15 DIAGNOSIS — R1110 Vomiting, unspecified: Secondary | ICD-10-CM | POA: Diagnosis not present

## 2017-07-15 DIAGNOSIS — Z85038 Personal history of other malignant neoplasm of large intestine: Secondary | ICD-10-CM | POA: Diagnosis not present

## 2017-07-15 DIAGNOSIS — D631 Anemia in chronic kidney disease: Secondary | ICD-10-CM | POA: Diagnosis not present

## 2017-07-15 DIAGNOSIS — I10 Essential (primary) hypertension: Secondary | ICD-10-CM | POA: Diagnosis not present

## 2017-07-15 DIAGNOSIS — I25708 Atherosclerosis of coronary artery bypass graft(s), unspecified, with other forms of angina pectoris: Secondary | ICD-10-CM | POA: Diagnosis not present

## 2017-07-15 LAB — BASIC METABOLIC PANEL
Anion gap: 11 (ref 5–15)
BUN: 18 mg/dL (ref 6–20)
CALCIUM: 9.7 mg/dL (ref 8.9–10.3)
CO2: 29 mmol/L (ref 22–32)
Chloride: 96 mmol/L — ABNORMAL LOW (ref 101–111)
Creatinine, Ser: 4.92 mg/dL — ABNORMAL HIGH (ref 0.44–1.00)
GFR calc Af Amer: 9 mL/min — ABNORMAL LOW (ref >60)
GFR, EST NON AFRICAN AMERICAN: 8 mL/min — AB (ref >60)
GLUCOSE: 105 mg/dL — AB (ref 65–99)
Potassium: 4.3 mmol/L (ref 3.5–5.1)
Sodium: 136 mmol/L (ref 135–145)

## 2017-07-15 LAB — CBC
HEMATOCRIT: 35.6 % — AB (ref 36.0–46.0)
Hemoglobin: 11.1 g/dL — ABNORMAL LOW (ref 12.0–15.0)
MCH: 29.8 pg (ref 26.0–34.0)
MCHC: 31.2 g/dL (ref 30.0–36.0)
MCV: 95.7 fL (ref 78.0–100.0)
PLATELETS: 171 10*3/uL (ref 150–400)
RBC: 3.72 MIL/uL — ABNORMAL LOW (ref 3.87–5.11)
RDW: 20.9 % — AB (ref 11.5–15.5)
WBC: 6.1 10*3/uL (ref 4.0–10.5)

## 2017-07-15 LAB — TYPE AND SCREEN
ABO/RH(D): O POS
ANTIBODY SCREEN: NEGATIVE
DONOR AG TYPE: NEGATIVE
Donor AG Type: NEGATIVE
UNIT DIVISION: 0
Unit division: 0

## 2017-07-15 LAB — BPAM RBC
BLOOD PRODUCT EXPIRATION DATE: 201901292359
BLOOD PRODUCT EXPIRATION DATE: 201901302359
ISSUE DATE / TIME: 201901042204
ISSUE DATE / TIME: 201901042059
Unit Type and Rh: 5100
Unit Type and Rh: 5100

## 2017-07-15 MED ORDER — FAMOTIDINE 20 MG PO TABS: 20.0000 mg | ORAL_TABLET | Freq: Every day | ORAL | Status: DC

## 2017-07-15 MED ORDER — FERROUS SULFATE 325 (65 FE) MG PO TBEC: 325.0000 mg | DELAYED_RELEASE_TABLET | Freq: Two times a day (BID) | ORAL | 0 refills | Status: DC

## 2017-07-15 NOTE — Procedures (Signed)
     HEMODIALYSIS TREATMENT NOTE:   3.25 hour heparin-free dialysis completed via left upper arm AVG (15g/antegrade). Pt terminated HD session 30 minutes early/AMA.  Dr. Theador Hawthorne was notified. Goal met: 1.6 liters removed without interruption in ultrafiltration. 2 units PRBCs transfused with HD.  All blood was returned and hemostasis was achieved within 10 minutes.  Report called to Lawernce Keas, RN.  Rockwell Alexandria, RN, CDN

## 2017-07-15 NOTE — Discharge Summary (Signed)
Physician Discharge Summary  Shawna [name]:[id number] DOB: July 25, 1940 DOA: 07/14/2017  PCP: [name], MD GI: Rehman  Admit date: 07/14/2017 Discharge date: 07/15/2017  Admitted From: home  Disposition: home   Recommendations for Outpatient Follow-up:  1. Follow up with PCP in 1 weeks 2. Follow up with GI in 2 weeks for recheck 3. Please obtain BMP/CBC in one week 4. Please follow up on the following pending results: final culture data  Discharge Condition: STABLE   CODE STATUS: FULL    Brief Hospitalization Summary: Please see all hospital notes, images, labs for full details of the hospitalization. HPI: Shawna [name] is a 77 y.o. female presented to ED complaining of vomiting and severe abdominal pain involving the mid and right lower abdomen that started about 2 hours prior to admission.  The patient has a history of ESRD on hemodialysis, CAD s/p CABG and recent admissions for GI bleeding and was diagnosed with AVMs.  Also had EGD 9/18 that confirmed nonbleeding duodenal AVM requiring ablation. She had a repeat EGD showed an AVM in prox jejunum which was ablated.  The patient was recently admitted for abdominal pain and found to have mesenteric ischemia and abdominal abscesses that were treated conservatively with bowel rest and IV antibiotics.  Shawna [name] says she has been feeling her normal self until early this morning when her symptoms began.  She was noted to have a Hg of 7.9 which is a sharp drop from when it was tested on 07/01/17 where it 10.6.  The patient denies having chest pain and SOB.   She denies hematochezia but says that she did have melena approximately 1 week ago but it resolved on its own.    ED Course:  Shawna [name] was seen in ED and was noted to have a troponin of 0.06 and Hg of 7.9.  She is due for hemodialysis today.   She is being admitted for observation given the drop in hemoglobin. She was noted to be hemoccult positive.  She is ordered for 2 units of PRBC during hemodialysis  today.   1. Acute blood loss anemia - Admitted for observation, transfused 2 units PRBC during hemodialysis.  Shawna [name] was seen by GI and felt safe to discharge home. Follow up with Dr. Laural Golden.  Home on oral iron BID and omeprazole.    2. Abdominal Pain - resolved.  She is eating well and having BMs.    3. ESRD on hemodialysis - consulted nephrology team for hemodialysis on 1/4. She tolerated it well.  Received PRBC transfusion during hemodialysis.  4. CAD s/p CABG - denies chest pain symptoms, follow troponin.  5. Essential Hypertension - resume home blood pressure medications when we can get them reconciled.  6. History of AVMs - Follow up with Dr. Laural Golden.    7. History of mesenteric ischemia - abd pain resolved now, follow clinically, continue supportive care.    DVT Prophylaxis: SCDs Code Status: Full   Family Communication: husband at bedside  Disposition Plan: Home   Discharge Diagnoses:  Principal Problem:   Acute blood loss anemia Active Problems:   ESRD on hemodialysis (HCC)   HLD (hyperlipidemia)   GERD (gastroesophageal reflux disease)   AVM (arteriovenous malformation) of small bowel, acquired ([place])   Hx of CABG   Carotid artery disease (HCC)   GI bleeding   Essential hypertension   History of colon cancer   History of ovarian cancer   Heme positive stool   Mesenteric ischemia (HCC)   Coronary  artery disease of bypass graft of native heart with stable angina pectoris (HCC)   Anemia  Discharge Instructions: Discharge Instructions    Call MD for:  difficulty breathing, headache or visual disturbances   Complete by:  As directed    Call MD for:  extreme fatigue   Complete by:  As directed    Call MD for:  persistant dizziness or light-headedness   Complete by:  As directed    Call MD for:  persistant nausea and vomiting   Complete by:  As directed    Diet - low sodium heart healthy   Complete by:  As directed    Increase activity slowly   Complete by:  As directed       Allergies as of 07/15/2017      Reactions   Aspirin Other (See Comments)   High Doses Mess up her stomach; "makes my bowels have blood in them". Takes 81 mg EC Aspirin    Penicillins Other (See Comments)   SYNCOPE? , "makes me real weak when I take it; like I'll pass out" Has patient had a PCN reaction causing immediate rash, facial/tongue/throat swelling, SOB or lightheadedness with hypotension: Yes Has patient had a PCN reaction causing severe rash involving mucus membranes or skin necrosis: no Has patient had a PCN reaction that required hospitalization no Has patient had a PCN reaction occurring within the last 10 years: no If all of the above   Amlodipine Swelling   Bactrim [sulfamethoxazole-trimethoprim] Rash   Contrast Media [iodinated Diagnostic Agents] Itching   Iron Itching, Other (See Comments)   "they gave me iron in dialysis; had to give me Benadryl cause I had to have the iron" (05/02/2012)   Nitrofurantoin Hives   Tylenol [acetaminophen] Itching, Other (See Comments)   Makes her feet on fire per Shawna [name]   Gabapentin Other (See Comments)   Unknown reaction   Dexilant [dexlansoprazole] Other (See Comments)   Upset stomach   Levaquin [levofloxacin In D5w] Rash   Morphine And Related Itching, Other (See Comments)   Itching in feet   Plavix [clopidogrel Bisulfate] Rash   Protonix [pantoprazole Sodium] Rash   Venofer [ferric Oxide] Itching, Other (See Comments)   Patient reports using Benadryl prior to doses as [place]      Medication List    TAKE these medications   ALPRAZolam 0.25 MG tablet Commonly known as:  XANAX Take 1 tablet (0.25 mg total) by mouth 2 (two) times daily as needed for anxiety or sleep.   amiodarone 200 MG tablet Commonly known as:  PACERONE Take 1 tablet (200 mg total) by mouth daily.   carvedilol 6.25 MG tablet Commonly known as:  COREG TAKE 1 TABLET BY MOUTH TWICE DAILY WITH  A  MEAL What changed:  See the new instructions.    cinacalcet 30 MG tablet Commonly known as:  SENSIPAR Take 30 mg by mouth daily after supper.   cloNIDine 0.2 MG tablet Commonly known as:  CATAPRES Take 0.2 mg by mouth daily as needed (high blood pressure).   diphenhydrAMINE 25 mg capsule Commonly known as:  BENADRYL Take 50 mg by mouth daily as needed for itching or allergies.   epoetin alfa 10000 UNIT/ML injection Commonly known as:  EPOGEN,PROCRIT Inject 1 mL (10,000 Units total) into the vein every Monday, Wednesday, and Friday with hemodialysis.   ferrous sulfate 325 (65 FE) MG EC tablet Take 1 tablet (325 mg total) by mouth 2 (two) times daily with a meal.  fluticasone 50 MCG/ACT nasal spray Commonly known as:  FLONASE Place 1 spray at bedtime as needed into both nostrils for allergies.   hydrALAZINE 50 MG tablet Commonly known as:  APRESOLINE Take 50 mg by mouth 2 (two) times daily as needed (high blood pressure).   isosorbide mononitrate 120 MG 24 hr tablet Commonly known as:  IMDUR Take 1 tablet (120 mg total) by mouth daily.   lidocaine-prilocaine cream Commonly known as:  EMLA Apply 1 application topically every Monday, Wednesday, and Friday. Prior to dialysis   multivitamin Tabs tablet Take 1 tablet by mouth daily.   nitroGLYCERIN 0.4 MG SL tablet Commonly known as:  NITROSTAT Place 1 tablet (0.4 mg total) under the tongue every 5 (five) minutes as needed for chest pain.   nystatin-triamcinolone cream Commonly known as:  MYCOLOG II Apply 1 application topically 2 (two) times daily. What changed:    when to take this  reasons to take this   omeprazole 20 MG capsule Commonly known as:  PRILOSEC Take 20 mg by mouth daily.   ondansetron 4 MG disintegrating tablet Commonly known as:  ZOFRAN-ODT Take 4 mg by mouth every 8 (eight) hours as needed for nausea or vomiting.   sevelamer carbonate 800 MG tablet Commonly known as:  RENVELA Take 800-2,400 mg by mouth See admin instructions. Take 3 tablets  (2400) by mouth twice daily with meals, take 1 tablet (800 mg) with snacks   simvastatin 20 MG tablet Commonly known as:  ZOCOR TAKE ONE TABLET BY MOUTH AT BEDTIME What changed:    how much to take  how to take this  when to take this   ticagrelor 90 MG Tabs tablet Commonly known as:  BRILINTA Take 90 mg by mouth 2 (two) times daily.      Follow-up Information    [name], MD. Schedule an appointment as soon as possible for a visit in 1 week(s).   Specialty:  Family Medicine Contact information: [place] [address] [phone]        Rogene Houston, MD. Schedule an appointment as soon as possible for a visit in 2 week(s).   Specialty:  Gastroenterology Why:  Hospital Follow Up  Contact information: [address], [address] [place] [address] [phone]          Allergies  Allergen Reactions  . Aspirin Other (See Comments)    High Doses Mess up her stomach; "makes my bowels have blood in them". Takes 81 mg EC Aspirin   . Penicillins Other (See Comments)    SYNCOPE? , "makes me real weak when I take it; like I'll pass out"  Has patient had a PCN reaction causing immediate rash, facial/tongue/throat swelling, SOB or lightheadedness with hypotension: Yes Has patient had a PCN reaction causing severe rash involving mucus membranes or skin necrosis: no Has patient had a PCN reaction that required hospitalization no Has patient had a PCN reaction occurring within the last 10 years: no If all of the above  . Amlodipine Swelling  . Bactrim [Sulfamethoxazole-Trimethoprim] Rash  . Contrast Media [Iodinated Diagnostic Agents] Itching  . Iron Itching and Other (See Comments)    "they gave me iron in dialysis; had to give me Benadryl cause I had to have the iron" (05/02/2012)  . Nitrofurantoin Hives  . Tylenol [Acetaminophen] Itching and Other (See Comments)    Makes her feet on fire per Shawna [name]  . Gabapentin Other (See Comments)    Unknown reaction   .  Dexilant [Dexlansoprazole] Other (See Comments)    Upset stomach  . Levaquin [Levofloxacin In D5w] Rash  . Morphine And Related Itching and Other (See Comments)    Itching in feet  . Plavix [Clopidogrel Bisulfate] Rash  . Protonix [Pantoprazole Sodium] Rash  . Venofer [Ferric Oxide] Itching and Other (See Comments)    Patient reports using Benadryl prior to doses as [place] as of 07/15/2017      Reactions   Aspirin Other (See Comments)   High Doses Mess up her stomach; "makes my bowels have blood in them". Takes 81 mg EC Aspirin    Penicillins Other (See Comments)   SYNCOPE? , "makes me real weak when I take it; like I'll pass out" Has patient had a PCN reaction causing immediate rash, facial/tongue/throat swelling, SOB or lightheadedness with hypotension: Yes Has patient had a PCN reaction causing severe rash involving mucus membranes or skin necrosis: no Has patient had a PCN reaction that required hospitalization no Has patient had a PCN reaction occurring within the last 10 years: no If all of the above   Amlodipine Swelling   Bactrim [sulfamethoxazole-trimethoprim] Rash   Contrast Media [iodinated Diagnostic Agents] Itching   Iron Itching, Other (See Comments)   "they gave me iron in dialysis; had to give me Benadryl cause I had to have the iron" (05/02/2012)   Nitrofurantoin Hives   Tylenol [acetaminophen] Itching, Other (See Comments)   Makes her feet on fire per Shawna [name]   Gabapentin Other (See Comments)   Unknown reaction   Dexilant [dexlansoprazole] Other (See Comments)   Upset stomach   Levaquin [levofloxacin In D5w] Rash   Morphine And Related Itching, Other (See Comments)   Itching in feet   Plavix [clopidogrel Bisulfate] Rash   Protonix [pantoprazole Sodium] Rash   Venofer [ferric Oxide] Itching, Other (See Comments)   Patient reports using Benadryl prior to doses as [place]      Medication List    TAKE these medications   ALPRAZolam  0.25 MG tablet Commonly known as:  XANAX Take 1 tablet (0.25 mg total) by mouth 2 (two) times daily as needed for anxiety or sleep.   amiodarone 200 MG tablet Commonly known as:  PACERONE Take 1 tablet (200 mg total) by mouth daily.   carvedilol 6.25 MG tablet Commonly known as:  COREG TAKE 1 TABLET BY MOUTH TWICE DAILY WITH  A  MEAL What changed:  See the new instructions.   cinacalcet 30 MG tablet Commonly known as:  SENSIPAR Take 30 mg by mouth daily after supper.   cloNIDine 0.2 MG tablet Commonly known as:  CATAPRES Take 0.2 mg by mouth daily as needed (high blood pressure).   diphenhydrAMINE 25 mg capsule Commonly known as:  BENADRYL Take 50 mg by mouth daily as needed for itching or allergies.   epoetin alfa 10000 UNIT/ML injection Commonly known as:  EPOGEN,PROCRIT Inject 1 mL (10,000 Units total) into the vein every Monday, Wednesday, and Friday with hemodialysis.   ferrous sulfate 325 (65 FE) MG EC tablet Take 1 tablet (325 mg total) by mouth 2 (two) times daily with a meal.   fluticasone 50 MCG/ACT nasal spray Commonly known as:  FLONASE Place 1 spray at bedtime as needed into both nostrils for allergies.   hydrALAZINE 50 MG tablet Commonly known as:  APRESOLINE Take 50 mg by mouth 2 (two) times daily as needed (high blood pressure).   isosorbide mononitrate 120 MG 24 hr  tablet Commonly known as:  IMDUR Take 1 tablet (120 mg total) by mouth daily.   lidocaine-prilocaine cream Commonly known as:  EMLA Apply 1 application topically every Monday, Wednesday, and Friday. Prior to dialysis   multivitamin Tabs tablet Take 1 tablet by mouth daily.   nitroGLYCERIN 0.4 MG SL tablet Commonly known as:  NITROSTAT Place 1 tablet (0.4 mg total) under the tongue every 5 (five) minutes as needed for chest pain.   nystatin-triamcinolone cream Commonly known as:  MYCOLOG II Apply 1 application topically 2 (two) times daily. What changed:    when to take  this  reasons to take this   omeprazole 20 MG capsule Commonly known as:  PRILOSEC Take 20 mg by mouth daily.   ondansetron 4 MG disintegrating tablet Commonly known as:  ZOFRAN-ODT Take 4 mg by mouth every 8 (eight) hours as needed for nausea or vomiting.   sevelamer carbonate 800 MG tablet Commonly known as:  RENVELA Take 800-2,400 mg by mouth See admin instructions. Take 3 tablets (2400) by mouth twice daily with meals, take 1 tablet (800 mg) with snacks   simvastatin 20 MG tablet Commonly known as:  ZOCOR TAKE ONE TABLET BY MOUTH AT BEDTIME What changed:    how much to take  how to take this  when to take this   ticagrelor 90 MG Tabs tablet Commonly known as:  BRILINTA Take 90 mg by mouth 2 (two) times daily.       Procedures/Studies: Dg Chest Port 1 View  Result Date: 07/01/2017 CLINICAL DATA:  Right side chest pain EXAM: PORTABLE CHEST 1 VIEW COMPARISON:  03/29/2017 FINDINGS: Interval removal of right dialysis catheter. Prior CABG. Bibasilar atelectasis or scarring. Heart is upper limits normal in size. No effusions or acute bony abnormality. IMPRESSION: Bibasilar atelectasis or scarring. Electronically Signed   By: Rolm Baptise M.D.   On: 07/01/2017 13:15   Dg Abdomen Acute W/chest  Result Date: 07/14/2017 CLINICAL DATA:  Initial evaluation for acute vomiting, mid and right abdominal pain. EXAM: DG ABDOMEN ACUTE W/ 1V CHEST COMPARISON:  Prior radiograph from 07/02/2017 and CT from 03/28/2017. FINDINGS: Median sternotomy wires underlying CABG markers and coronary stent. Heart size within normal limits. Mediastinal silhouette normal. Aortic atherosclerosis. Lungs mildly hyperinflated. Left perihilar and bibasilar atelectasis ache and/or scarring. No focal infiltrates. No pulmonary edema or pleural effusion. No pneumothorax. Bowel gas pattern within normal limits without obstruction or ileus. Scattered retained contrast present throughout the colon. No free air. Multiple  surgical clips with suture material overlie the lower mid abdomen and pelvis. No appreciable soft tissue mass or abnormal calcification Degenerative changes noted within the lower lumbar spine about the hips bilaterally. IMPRESSION: 1. Nonobstructive bowel gas pattern with no radiographic evidence for acute intra-abdominal pathology. Retained contrast material throughout the colon. 2. No active cardiopulmonary disease. Electronically Signed   By: Jeannine Boga M.D.   On: 07/14/2017 05:04      Subjective: Shawna [name] reports that she feel much better.  She is eating and drinking well.  She wants to go home.  No black stools no hematemesis.    Discharge Exam: Vitals:   07/15/17 0025 07/15/17 0700  BP: (!) 180/63 (!) 120/42  Pulse: 79 65  Resp: 16 18  Temp: 98.3 F (36.8 C) 98.5 F (36.9 C)  SpO2: 99% 99%   Vitals:   07/15/17 0000 07/15/17 0015 07/15/17 0025 07/15/17 0700  BP: (!) 162/72 (!) 171/76 (!) 180/63 (!) 120/42  Pulse: 77 78 79  65  Resp:   16 18  Temp:   98.3 F (36.8 C) 98.5 F (36.9 C)  TempSrc:   Oral Oral  SpO2:   99% 99%  Weight:   57 kg (125 lb 10.6 oz)   Height:        General: Shawna [name] is alert, awake, not in acute distress Cardiovascular: RRR, S1/S2 +, no rubs, no gallops Respiratory: CTA bilaterally, no wheezing, no rhonchi Abdominal: Soft, NT, ND, bowel sounds + Extremities: no edema, no cyanosis   The results of significant diagnostics from this hospitalization (including imaging, microbiology, ancillary and laboratory) are listed below for reference.     Microbiology: No results found for this or any previous visit (from the past 240 hour(s)).   Labs: BNP (last 3 results) Recent Labs    07/16/16 1517  BNP 426.8*   Basic Metabolic Panel: Recent Labs  Lab 07/14/17 0225 07/15/17 0648  NA 138 136  K 4.9 4.3  CL 95* 96*  CO2 28 29  GLUCOSE 109* 105*  BUN 49* 18  CREATININE 8.06* 4.92*  CALCIUM 10.3 9.7   Liver Function Tests: Recent Labs  Lab  07/14/17 0225  AST 26  ALT 24  ALKPHOS 50  BILITOT 0.8  PROT 6.4*  ALBUMIN 3.5   Recent Labs  Lab 07/14/17 0225  LIPASE 40   No results for input(s): AMMONIA in the last 168 hours. CBC: Recent Labs  Lab 07/14/17 0225 07/15/17 0648  WBC 8.2 6.1  HGB 7.9* 11.1*  HCT 25.3* 35.6*  MCV 103.7* 95.7  PLT 184 171   Cardiac Enzymes: Recent Labs  Lab 07/14/17 0225 07/14/17 0912 07/14/17 2049  TROPONINI 0.06* 0.06* 0.11*   BNP: Invalid input(s): POCBNP CBG: Recent Labs  Lab 07/14/17 1642  GLUCAP 109*   D-Dimer No results for input(s): DDIMER in the last 72 hours. Hgb A1c No results for input(s): HGBA1C in the last 72 hours. Lipid Profile No results for input(s): CHOL, HDL, LDLCALC, TRIG, CHOLHDL, LDLDIRECT in the last 72 hours. Thyroid function studies No results for input(s): TSH, T4TOTAL, T3FREE, THYROIDAB in the last 72 hours.  Invalid input(s): FREET3 Anemia work up No results for input(s): VITAMINB12, FOLATE, FERRITIN, TIBC, IRON, RETICCTPCT in the last 72 hours. Urinalysis    Component Value Date/Time   COLORURINE YELLOW 12/16/2012 1919   APPEARANCEUR CLOUDY (A) 12/16/2012 1919   LABSPEC 1.009 12/16/2012 1919   PHURINE 7.5 12/16/2012 1919   GLUCOSEU NEGATIVE 12/16/2012 1919   HGBUR TRACE (A) 12/16/2012 1919   BILIRUBINUR NEGATIVE 12/16/2012 1919   KETONESUR NEGATIVE 12/16/2012 1919   PROTEINUR 100 (A) 12/16/2012 1919   UROBILINOGEN 0.2 12/16/2012 1919   NITRITE NEGATIVE 12/16/2012 1919   LEUKOCYTESUR SMALL (A) 12/16/2012 1919   Sepsis Labs Invalid input(s): PROCALCITONIN,  WBC,  LACTICIDVEN Microbiology No results found for this or any previous visit (from the past 240 hour(s)).  Time coordinating discharge:   SIGNED:  Irwin Brakeman, MD  [facility] 07/15/2017, 12:33 PM Pager [phone]  If 7PM-7AM, please contact night-coverage [email or web address] Password [email or web address]

## 2017-07-15 NOTE — Progress Notes (Signed)
Shawna [name]  MRN: [id number]  DOB/AGE: 1940-11-20 77 y.o.  Primary Care Physician:Howard, Lennette Bihari, MD  Admit date: 07/14/2017  Chief Complaint:  Chief Complaint  Patient presents with  . Emesis    S-Pt presented on  07/14/2017 with  Chief Complaint  Patient presents with  . Emesis  . Pt offers no new complaints.Pt says " my belly is better, I may be going home today"    meds   . amiodarone  200 mg Oral Daily  . carvedilol  6.25 mg Oral BID WC  . cinacalcet  30 mg Oral QPC supper  . cloNIDine  0.1 mg Oral BID  . epoetin (EPOGEN/PROCRIT) injection  3,000 Units Intravenous Q M,W,F-HD  . isosorbide mononitrate  120 mg Oral Daily  . multivitamin  1 tablet Oral Daily  . ondansetron  8 mg Oral Once  . simvastatin  20 mg Oral QHS  . sodium chloride flush  3 mL Intravenous Q12H      Physical Exam: Vital signs in last 24 hours: Temp:  [98.3 F (36.8 C)-98.6 F (37 C)] 98.5 F (36.9 C) (01/05 0700) Pulse Rate:  [64-84] 65 (01/05 0700) Resp:  [9-23] 18 (01/05 0700) BP: (120-211)/(42-89) 120/42 (01/05 0700) SpO2:  [93 %-99 %] 99 % (01/05 0700) Weight:  [125 lb 10.6 oz (57 kg)-129 lb 3 oz (58.6 kg)] 125 lb 10.6 oz (57 kg) (01/05 0025) Weight change: 4 lb 3 oz (1.9 kg)    Intake/Output from previous day: 01/04 0701 - 01/05 0700 In: 630 [Blood:630] Out: 1670  No intake/output data recorded.   Physical Exam: General- pt is awake,alert, oriented to time place and person Resp- No acute REsp distress, no rhonchi. CVS- S1S2 regular in rate and rhythm GIT- BS+, soft, tenderness,non rigid EXT- NO LE Edema, NO Cyanosis Access-  Left AVG + bruit   Lab Results: CBC Recent Labs    07/14/17 0225  WBC 8.2  HGB 7.9*  HCT 25.3*  PLT 184    BMET Recent Labs    07/14/17 0225 07/15/17 0648  NA 138 136  K 4.9 4.3  CL 95* 96*  CO2 28 29  GLUCOSE 109* 105*  BUN 49* 18  CREATININE 8.06* PENDING  CALCIUM 10.3 9.7    MICRO No results found for this or any previous  visit (from the past 240 hour(s)).    Lab Results  Component Value Date   PTH 709.7 (H) 05/29/2013   CALCIUM 9.7 07/15/2017   CAION 1.06 (L) 07/26/2016   PHOS 6.0 (H) 03/31/2017        Impression: 1)Renal ESRd on HD Pt is  On Monday/Wednesday/Friday schedule as outpt Pt wasdialyzed yesterday  2)HTN BP near to goal    3)Anemia in ESRD the goal for HGb is 9--11. Pt HGb not at goal Admitted with GI bleed Will keep on Epo  4)CKD Mineral-Bone Disorder Secondary Hyperparathyroidism present On Cinacalcet Phosphorus not at goal. On binders Calcium at Goal   5)GI-Admitted with abdominal pain and emesis  GI team and  Primary MD following  6)Electrolytes  Normokalemic Normonatremic   7)Acid base Co2 at goal      Plan:  No need for HD today. Will continue current tx    BHUTANI,MANPREET S 07/15/2017, 7:53 AM

## 2017-07-15 NOTE — Consult Note (Signed)
Referring Provider: No ref. provider found Primary Care Physician:  [name], MD Primary Gastroenterologist:  Barney Drain  Reason for Consultation:  ANEMIA   Impression: ADMITTED WITH NAUSEA AND VOMITING FOLLOWED BY ABDOMINAL PAIN. SYMPTOMS MAY BE DUE TO MESENTERIC ISCHEMIA DUE TO SUPPLY DEMAND MISMATCH. NOW SYMPTOM FREE. CLINICALLY IMPROVED.   Plan: 1. SUPPORTIVE CARE 2. ADD ORAL IRON BID AND OMEPRAZOLE DAILY. 3. CBC IN 2 WEEKS 4. OK TO D/C HOME TODAY WITH FOLLOW UP WITH DR. Laural Golden.      HPI:  NAUSEATED AND TOOK NAUSEA MEDICINE. STARTED THROWING UP MULTIPLE TIMES IN A ROW AND THEN TUMMY HURTING(SEVERE ABDOMINAL PAIN). CAME TO ED BY POV. HAD SEVERE LOWER BACK PAIN. SINCE ADMISSION GIVEN PAIN AND [place] MEDICINE AND 2u pRBCS. [facility]. PAIN IN BELLY: GINE. PAIN IN BACK: GONE. TAKING MVI AT [facility]. NEVER TRIED TAKING IRON PILLS IN THE PAST. HAD DARK STOOL BUT NOT BLACK STOOL COUPLE WEEKS AGO. LAST BM: YESTERDAY, PALE GREEN. DIARRHEA: 2-3X IN ONE DAY AND HAPPENS 2-3 TIMES A DAY. LAST SEVERE CHEST PAIN 2-3 WEEKS AGO BUT NOT TODAY.   PT DENIES FEVER, CHILLS, HEMATOCHEZIA, HEMATEMESIS, melena, SHORTNESS OF BREATH,  CHANGE IN BOWEL IN HABITS, constipation, problems swallowing, problems with sedation, OR heartburn or indigestion.  Past Medical History:  Diagnosis Date  . Acute on chronic respiratory failure with hypoxia ([place]) 10/10/2016  . Anxiety   . Arthritis   . AVM (arteriovenous malformation) of colon   . CAD (coronary artery disease)    a. 12/2011 NSTEMI/Cath/PCI LCX (2.25x14 Resolute DES) & D1 (2.25x22 Resolute DES);  b. 01/2012 Cath/PCI: LM 30, LAD 30p, 40-35m, D1 stent ok, 99 in sm branch of diag, LCX patent stent, OM1 20, RCA 95 ost (4.0x12 Promus DES), EF 55%;  c. 04/2012 Lexi Cardiolite  EF 48%, small area of scar @ base/mid inflat wall with mild peri-infarct ischemia.; CABG 12/4  . Carotid artery disease ([place])    a. 97-67% LICA,  09/4191   . Chronic bronchitis ([place])   . Chronic diastolic CHF (congestive heart failure) ([place])    a. 02/2012 Echo EF 60-65%, nl wall motion, Gr 1 DD, mod MR  . Colon cancer ([place]) 1992  . Esophageal stricture   . ESRD on hemodialysis ([place])    ESRD due to HTN, started dialysis 2011 and gets HD at [facility] with Dr Hinda Lenis on MWF schedule.  Access is LUA AVF as of Sept 2014.   Marland Kitchen GERD (gastroesophageal reflux disease)   . High cholesterol 12/2011  . History of blood transfusion 07/2011; 12/2011; 01/2012 X 2; 04/2012  . History of gout   . History of lower GI bleeding   . Hypertension   . Iron deficiency anemia   . Mitral regurgitation    a. Moderate by echo, 02/2012  . Myocardial infarction ([place])   . Ovarian cancer ([place]) 1992  . Pneumonia ~ 2009  . PUD (peptic ulcer disease)   . TIA (transient ischemic attack)     Past Surgical History:  Procedure Laterality Date  . ABDOMINAL HYSTERECTOMY  1992  . APPENDECTOMY  06/1990  . AV FISTULA PLACEMENT  07/2009   left upper arm  . AV FISTULA PLACEMENT Right 09/06/2016   Procedure: RIGHT FOREARM ARTERIOVENOUS (AV) GRAFT;  Surgeon: Elam Dutch, MD;  Location: [facility] OR;  Service: Vascular;  Laterality: Right;  . AV FISTULA PLACEMENT N/A 02/24/2017   Procedure: INSERTION OF ARTERIOVENOUS (AV) GORE-TEX GRAFT ARM (BRACHIAL AXILLARY);  Surgeon: Katha Cabal, MD;  Location: [facility] ORS;  Service: Vascular;  Laterality: N/A;  . [place] Right 09/06/2016   Procedure: REMOVAL OF Right Arm ARTERIOVENOUS GORETEX GRAFT and Vein Patch angioplasty of brachial artery;  Surgeon: Angelia Mould, MD;  Location: [place];  Service: Vascular;  Laterality: Right;  . COLON RESECTION  1992  . COLON SURGERY    . CORONARY ANGIOPLASTY WITH STENT PLACEMENT  12/15/11   "2"  . CORONARY ANGIOPLASTY WITH STENT PLACEMENT  y/2013   "1; makes total of 3" (05/02/2012)  . CORONARY ARTERY BYPASS GRAFT  06/13/2012   Procedure: CORONARY ARTERY BYPASS GRAFTING (CABG);   Surgeon: Grace Isaac, MD;  Location: [place];  Service: Open Heart Surgery;  Laterality: N/A;  cabg x four;  using left internal mammary artery, and left leg greater saphenous vein harvested endoscopically  . CORONARY STENT INTERVENTION N/A 10/13/2016   Procedure: Coronary Stent Intervention;  Surgeon: Troy Sine, MD;  Location: [place] CV LAB;  Service: Cardiovascular;  Laterality: N/A;  . DIALYSIS/PERMA CATHETER REMOVAL N/A 04/18/2017   Procedure: DIALYSIS/PERMA CATHETER REMOVAL;  Surgeon: Katha Cabal, MD;  Location: [place] CV LAB;  Service: Cardiovascular;  Laterality: N/A;  . DILATION AND CURETTAGE OF UTERUS    . ESOPHAGOGASTRODUODENOSCOPY  01/20/2012   Procedure: ESOPHAGOGASTRODUODENOSCOPY (EGD);  Surgeon: Ladene Artist, MD,FACG;  Location[facility] ENDOSCOPY;  Service: Endoscopy;  Laterality: N/A;  . ESOPHAGOGASTRODUODENOSCOPY N/A 03/26/2013   Procedure: ESOPHAGOGASTRODUODENOSCOPY (EGD);  Surgeon: Irene Shipper, MD;  Location: [facility] ENDOSCOPY;  Service: Endoscopy;  Laterality: N/A;  . ESOPHAGOGASTRODUODENOSCOPY N/A 04/30/2015   Procedure: ESOPHAGOGASTRODUODENOSCOPY (EGD);  Surgeon: Rogene Houston, MD;  [facility];  Service: Endoscopy;  Laterality: N/A;  1pm - moved to 10/20 @ 1:10  . ESOPHAGOGASTRODUODENOSCOPY N/A 07/29/2016   Procedure: ESOPHAGOGASTRODUODENOSCOPY (EGD);  Surgeon: Manus Gunning, MD;  Location: [place];  Service: Gastroenterology;  Laterality: N/A;  enteroscopy  . INTRAOPERATIVE TRANSESOPHAGEAL ECHOCARDIOGRAM  06/13/2012   Procedure: INTRAOPERATIVE TRANSESOPHAGEAL ECHOCARDIOGRAM;  Surgeon: Grace Isaac, MD;  Location: [place];  Service: Open Heart Surgery;  Laterality: N/A;  . IR GENERIC HISTORICAL  07/26/2016   IR FLUORO GUIDE CV LINE RIGHT 07/26/2016 Greggory Keen, MD [facility]  . IR GENERIC HISTORICAL  07/26/2016   IR US GUIDE VASC ACCESS RIGHT 07/26/2016 Greggory Keen, MD [facility]  . IR GENERIC HISTORICAL  08/02/2016   IR US  GUIDE VASC ACCESS RIGHT 08/02/2016 Greggory Keen, MD [facility]  . IR GENERIC HISTORICAL  08/02/2016   IR FLUORO GUIDE CV LINE RIGHT 08/02/2016 Greggory Keen, MD [facility]  . IR RADIOLOGY PERIPHERAL GUIDED IV START  03/28/2017  . IR US GUIDE VASC ACCESS RIGHT  03/28/2017  . LEFT HEART CATH AND CORONARY ANGIOGRAPHY N/A 09/20/2016   Procedure: Left Heart Cath and Coronary Angiography;  Surgeon: [name], MD;  Location: [place] CV LAB;  Service: Cardiovascular;  Laterality: N/A;  . LEFT HEART CATH AND CORS/GRAFTS ANGIOGRAPHY N/A 10/13/2016   Procedure: Left Heart Cath and Cors/Grafts Angiography;  Surgeon: Troy Sine, MD;  Location: [place] CV LAB;  Service: Cardiovascular;  Laterality: N/A;  . LEFT HEART CATHETERIZATION WITH CORONARY ANGIOGRAM N/A 12/15/2011   Procedure: LEFT HEART CATHETERIZATION WITH CORONARY ANGIOGRAM;  Surgeon: Burnell Blanks, MD;  Location: [facility] CATH LAB;  Service: Cardiovascular;  Laterality: N/A;  . LEFT HEART CATHETERIZATION WITH CORONARY ANGIOGRAM N/A 01/10/2012   Procedure: LEFT HEART CATHETERIZATION WITH CORONARY ANGIOGRAM;  Surgeon: Peter M Martinique, MD;  Location: [facility] CATH LAB;  Service: Cardiovascular;  Laterality: N/A;  . LEFT HEART CATHETERIZATION WITH CORONARY ANGIOGRAM N/A 06/08/2012   Procedure: LEFT HEART CATHETERIZATION WITH CORONARY ANGIOGRAM;  Surgeon: Burnell Blanks, MD;  Location: [facility] CATH LAB;  Service: Cardiovascular;  Laterality: N/A;  . LEFT HEART CATHETERIZATION WITH CORONARY/GRAFT ANGIOGRAM N/A 12/10/2013   Procedure: LEFT HEART CATHETERIZATION WITH Beatrix Fetters;  Surgeon: [name], MD;  Location: [facility] CATH LAB;  Service: Cardiovascular;  Laterality: N/A;  . OVARY SURGERY     ovarian cancer  . REVISION OF ARTERIOVENOUS GORETEX GRAFT N/A 02/24/2017   Procedure: REVISION OF ARTERIOVENOUS GORETEX GRAFT (RESECTION);  Surgeon: Katha Cabal, MD;  Location: [facility] ORS;  Service: Vascular;  Laterality: N/A;  Earney Mallet N/A 10/15/2013   Procedure: Fistulogram;  Surgeon: Serafina Mitchell, MD;  Location: [facility] CATH LAB;  Service: Cardiovascular;  Laterality: N/A;  . THROMBECTOMY / ARTERIOVENOUS GRAFT REVISION  2011   left upper arm  . TUBAL LIGATION  1980's  . UPPER EXTREMITY ANGIOGRAPHY Bilateral 12/06/2016   Procedure: Upper Extremity Angiography;  Surgeon: Katha Cabal, MD;  Location: [place] CV LAB;  Service: Cardiovascular;  Laterality: Bilateral;  . UPPER EXTREMITY INTERVENTION Left 06/06/2017   Procedure: UPPER EXTREMITY INTERVENTION;  Surgeon: Katha Cabal, MD;  Location: [place] CV LAB;  Service: Cardiovascular;  Laterality: Left;    Prior to Admission medications   Medication Sig Start Date End Date Taking? Authorizing Provider  ALPRAZolam (XANAX) 0.25 MG tablet Take 1 tablet (0.25 mg total) by mouth 2 (two) times daily as needed for anxiety or sleep. 04/05/17  Yes Arrien, Jimmy Picket, MD  amiodarone (PACERONE) 200 MG tablet Take 1 tablet (200 mg total) by mouth daily. 12/08/16  Yes Herminio Commons, MD  carvedilol (COREG) 6.25 MG tablet TAKE 1 TABLET BY MOUTH TWICE DAILY WITH  A  MEAL Patient taking differently: TAKE 1 TABLET (6.25 MG) BY MOUTH TWICE DAILY WITH  A  MEAL 06/15/17  Yes Herminio Commons, MD  cinacalcet (SENSIPAR) 30 MG tablet Take 30 mg by mouth daily after supper.   Yes [provider]  cloNIDine (CATAPRES) 0.2 MG tablet Take 0.2 mg by mouth daily as needed (high blood pressure).    Yes [provider]  diphenhydrAMINE (BENADRYL) 25 mg capsule Take 50 mg by mouth daily as needed for itching or allergies.    Yes [provider]  epoetin alfa (EPOGEN,PROCRIT) 28315 UNIT/ML injection Inject 1 mL (10,000 Units total) into the vein every Monday, Wednesday, and Friday with hemodialysis. 04/11/16  Yes Orvan Falconer, MD  fluticasone (FLONASE) 50 MCG/ACT nasal spray Place 1 spray at bedtime as needed into both nostrils for allergies.     Yes [provider]  hydrALAZINE (APRESOLINE) 50 MG tablet Take 50 mg by mouth 2 (two) times daily as needed (high blood pressure).   Yes [provider]  isosorbide mononitrate (IMDUR) 120 MG 24 hr tablet Take 1 tablet (120 mg total) by mouth daily. 05/11/17  Yes Herminio Commons, MD  lidocaine-prilocaine (EMLA) cream Apply 1 application topically every Monday, Wednesday, and Friday. Prior to dialysis   Yes [provider]  multivitamin (RENA-VIT) TABS tablet Take 1 tablet by mouth daily.   Yes [provider]  nitroGLYCERIN (NITROSTAT) 0.4 MG SL tablet Place 1 tablet (0.4 mg total) under the tongue every 5 (five) minutes as needed for chest pain. 06/05/17  Yes Herminio Commons,  MD  nystatin-triamcinolone (MYCOLOG II) cream Apply 1 application topically 2 (two) times daily. Patient taking differently: Apply 1 application topically 2 (two) times daily as needed (itching/rash).  05/30/17  Yes Rehman, Mechele Dawley, MD  omeprazole (PRILOSEC) 20 MG capsule Take 20 mg by mouth daily.   Yes [provider]  ondansetron (ZOFRAN-ODT) 4 MG disintegrating tablet Take 4 mg by mouth every 8 (eight) hours as needed for nausea or vomiting.  07/12/17  Yes [provider]  sevelamer carbonate (RENVELA) 800 MG tablet Take 800-2,400 mg by mouth See admin instructions. Take 3 tablets (2400) by mouth twice daily with meals, take 1 tablet (800 mg) with snacks   Yes [provider]  simvastatin (ZOCOR) 20 MG tablet TAKE ONE TABLET BY MOUTH AT BEDTIME Patient taking differently: TAKE ONE TABLET (20 MG) BY MOUTH DAILY AT BEDTIME 02/13/17  Yes Herminio Commons, MD  ticagrelor (BRILINTA) 90 MG TABS tablet Take 90 mg by mouth 2 (two) times daily.   Yes [provider]    Current Facility-Administered Medications  Medication Dose Route Frequency Provider Last Rate Last Dose  . 0.9 %  sodium chloride infusion  250 mL Intravenous PRN Johnson, Clanford L,  MD      . 0.9 %  sodium chloride infusion   Intravenous Once Johnson, Clanford L, MD      . 0.9 %  sodium chloride infusion  100 mL Intravenous PRN Bhutani, Manpreet S, MD      . 0.9 %  sodium chloride infusion  100 mL Intravenous PRN Bhutani, Manpreet S, MD      . ALPRAZolam Duanne Moron) tablet 0.25 mg  0.25 mg Oral BID PRN Johnson, Clanford L, MD      . alteplase (CATHFLO ACTIVASE) injection 2 mg  2 mg Intracatheter Once PRN Theador Hawthorne, Manpreet S, MD      . amiodarone (PACERONE) tablet 200 mg  200 mg Oral Daily Johnson, Clanford L, MD      . carvedilol (COREG) tablet 6.25 mg  6.25 mg Oral BID WC Johnson, Clanford L, MD   6.25 mg at 07/15/17 0834  . cinacalcet (SENSIPAR) tablet 30 mg  30 mg Oral QPC supper Johnson, Clanford L, MD      . cloNIDine (CATAPRES) tablet 0.1 mg  0.1 mg Oral BID Johnson, Clanford L, MD      . diphenhydrAMINE (BENADRYL) capsule 50 mg  50 mg Oral Daily PRN Johnson, Clanford L, MD      . epoetin alfa (EPOGEN,PROCRIT) injection 3,000 Units  3,000 Units Intravenous Q M,W,F-HD Bhutani, Manpreet S, MD      . fluticasone (FLONASE) 50 MCG/ACT nasal spray 1 spray  1 spray Each Nare QHS PRN Johnson, Clanford L, MD      . heparin injection 1,000 Units  1,000 Units Dialysis PRN Theador Hawthorne, Manpreet S, MD      . isosorbide mononitrate (IMDUR) 24 hr tablet 120 mg  120 mg Oral Daily Johnson, Clanford L, MD      . lidocaine (PF) (XYLOCAINE) 1 % injection 5 mL  5 mL Intradermal PRN Bhutani, Manpreet S, MD      . lidocaine-prilocaine (EMLA) cream 1 application  1 application Topical PRN Bhutani, Manpreet S, MD      . multivitamin (RENA-VIT) tablet 1 tablet  1 tablet Oral Daily Wynetta Emery, Clanford L, MD   1 tablet at 07/15/17 0834  . nitroGLYCERIN (NITROSTAT) SL tablet 0.4 mg  0.4 mg Sublingual Q5 min PRN Murlean Iba, MD      .  ondansetron (ZOFRAN) tablet 4 mg  4 mg Oral Q6H PRN Johnson, Clanford L, MD       Or  . ondansetron (ZOFRAN) injection 4 mg  4 mg Intravenous Q6H PRN Johnson, Clanford  L, MD      . ondansetron (ZOFRAN-ODT) disintegrating tablet 4 mg  4 mg Oral Q8H PRN Johnson, Clanford L, MD      . ondansetron (ZOFRAN-ODT) disintegrating tablet 8 mg  8 mg Oral Once Ripley Fraise, MD      . pentafluoroprop-tetrafluoroeth (GEBAUERS) aerosol 1 application  1 application Topical PRN Theador Hawthorne, Manpreet S, MD      . simvastatin (ZOCOR) tablet 20 mg  20 mg Oral QHS Johnson, Clanford L, MD      . sodium chloride flush (NS) 0.9 % injection 3 mL  3 mL Intravenous Q12H Johnson, Clanford L, MD   3 mL at 07/15/17 0836  . sodium chloride flush (NS) 0.9 % injection 3 mL  3 mL Intravenous PRN Wynetta Emery, Clanford L, MD        Allergies as of 07/13/2017 - Review Complete 07/01/2017  Allergen Reaction Noted  . Aspirin Other (See Comments) 12/15/2011  . Penicillins Other (See Comments) 12/15/2011  . Amlodipine Swelling 01/19/2015  . Bactrim [sulfamethoxazole-trimethoprim] Rash 12/15/2011  . Contrast media [iodinated diagnostic agents] Itching 12/15/2011  . Iron Itching and Other (See Comments) 05/02/2012  . Nitrofurantoin Hives 05/27/2015  . Tylenol [acetaminophen] Itching and Other (See Comments) 07/08/2016  . Gabapentin Other (See Comments) 02/08/2017  . Dexilant [dexlansoprazole] Other (See Comments) 05/29/2013  . Levaquin [levofloxacin in d5w] Rash 10/04/2013  . Morphine and related Itching and Other (See Comments) 09/16/2014  . Plavix [clopidogrel bisulfate] Rash 01/05/2012  . Protonix [pantoprazole sodium] Rash 03/21/2013  . Venofer [ferric oxide] Itching and Other (See Comments) 05/02/2012   Family History  Problem Relation Age of Onset  . Heart disease Mother        Heart Disease before age 65  . Hyperlipidemia Mother   . Hypertension Mother   . Diabetes Mother   . Heart attack Mother   . Heart disease Father        Heart Disease before age 73  . Hyperlipidemia Father   . Hypertension Father   . Diabetes Father   . Diabetes Sister   . Hypertension Sister   . Diabetes  Brother   . Hyperlipidemia Brother   . Heart attack Brother   . Hypertension Sister   . Heart attack Brother   . Other Unknown        noncontributory for early CAD  . Colon cancer Neg Hx   . Esophageal cancer Neg Hx   . Liver disease Neg Hx   . Kidney disease Neg Hx   . Colon polyps Neg Hx    Social History   Socioeconomic History  . Marital status: Married    Spouse name: Not on file  . Number of children: Not on file  . Years of education: Not on file  . Highest education level: Not on file  Social Needs  . Financial resource strain: Not on file  . Food insecurity - worry: Not on file  . Food insecurity - inability: Not on file  . Transportation needs - medical: Not on file  . Transportation needs - non-medical: Not on file  Occupational History  . Not on file  Tobacco Use  . Smoking status: Never Smoker  . Smokeless tobacco: Never Used  Substance and Sexual Activity  . Alcohol  use: No    Alcohol/week: 0.0 oz  . Drug use: No  . Sexual activity: Yes    Birth control/protection: Surgical  Other Topics Concern  . Not on file  Social History Narrative   Lives in [place], [place] with husband.  Dialysis pt - [facility].   Review of Systems: PER HPI OTHERWISE ALL SYSTEMS ARE NEGATIVE. MAY HAVE CHEST PAIN STARTS IN MID-CHEST AND RADIATES TO BACK AND TO NECK.  Vitals: Blood pressure (!) 120/42, pulse 65, temperature 98.5 F (36.9 C), temperature source Oral, resp. rate 18, height 5\' 1"  (1.549 m), weight 125 lb 10.6 oz (57 kg), SpO2 99 %.  Physical Exam: General:   Alert,  Well-developed, well-nourished, pleasant and cooperative in NAD Head:  Normocephalic and atraumatic. Eyes:  Sclera clear, no icterus.   Conjunctiva pink. Mouth:  No lesions, dentition normal. Neck:  Supple; no masses. Lungs:  Clear throughout to auscultation.   No wheezes. No acute distress. Heart:  Regular rate and rhythm; no murmurs. Abdomen:  Soft, nontender and nondistended. No masses noted. Normal bowel  sounds, without guarding, and without rebound.   Msk:  Symmetrical without gross deformities. Normal posture. Extremities:  Without edema. Neurologic:  Alert and  oriented x4;  grossly normal neurologically. Cervical Nodes:  No significant cervical adenopathy. Psych:  Alert and cooperative. Normal mood and affect.  Lab Results: Recent Labs    07/14/17 0225 07/15/17 0648  WBC 8.2 6.1  HGB 7.9* 11.1*  HCT 25.3* 35.6*  PLT 184 171   BMET Recent Labs    07/14/17 0225 07/15/17 0648  NA 138 136  K 4.9 4.3  CL 95* 96*  CO2 28 29  GLUCOSE 109* 105*  BUN 49* 18  CREATININE 8.06* 4.92*  CALCIUM 10.3 9.7   LFT Recent Labs    07/14/17 0225  PROT 6.4*  ALBUMIN 3.5  AST 26  ALT 24  ALKPHOS 50  BILITOT 0.8     Studies/Results:  JAN 4: AAA-NONSPECIFIC BOWEL GAS PATTERN   LOS: 1 day   Rayshad Riviello  07/15/2017, 9:14 AM

## 2017-07-15 NOTE — Progress Notes (Signed)
Patient is to be discharged home and in stable condition. Patient's IV removed, WNL. Patient given discharge instructions and verbalized understanding. Patient escorted out by staff via wheelchair.  Celestia Khat, RN

## 2017-07-16 LAB — HEPATITIS B SURFACE ANTIBODY, QUANTITATIVE: Hep B S AB Quant (Post): 21.9 m[IU]/mL (ref >9.9)

## 2017-07-16 LAB — HEPATITIS B SURFACE ANTIGEN: Hepatitis B Surface Ag: NEGATIVE

## 2017-07-20 ENCOUNTER — Ambulatory Visit (INDEPENDENT_AMBULATORY_CARE_PROVIDER_SITE_OTHER): Payer: Medicare HMO | Admitting: Internal Medicine

## 2017-07-20 ENCOUNTER — Encounter (INDEPENDENT_AMBULATORY_CARE_PROVIDER_SITE_OTHER): Payer: Self-pay | Admitting: Internal Medicine

## 2017-07-20 VITALS — BP 160/60 | HR 64 | Temp 98.0°F | Ht 61.0 in | Wt 129.2 lb

## 2017-07-20 DIAGNOSIS — D500 Iron deficiency anemia secondary to blood loss (chronic): Secondary | ICD-10-CM | POA: Diagnosis not present

## 2017-07-20 LAB — CBC WITH DIFFERENTIAL/PLATELET
BASOS ABS: 60 {cells}/uL (ref 0–200)
Basophils Relative: 1 %
EOS ABS: 252 {cells}/uL (ref 15–500)
EOS PCT: 4.2 %
HCT: 33.5 % — ABNORMAL LOW (ref 35.0–45.0)
Hemoglobin: 10.7 g/dL — ABNORMAL LOW (ref 11.7–15.5)
LYMPHS ABS: 834 {cells}/uL — AB (ref 850–3900)
MCH: 30.4 pg (ref 27.0–33.0)
MCHC: 31.9 g/dL — AB (ref 32.0–36.0)
MCV: 95.2 fL (ref 80.0–100.0)
MPV: 13.5 fL — ABNORMAL HIGH (ref 7.5–12.5)
Monocytes Relative: 11.5 %
Neutro Abs: 4164 cells/uL (ref 1500–7800)
Neutrophils Relative %: 69.4 %
Platelets: 178 10*3/uL (ref 140–400)
RBC: 3.52 10*6/uL — ABNORMAL LOW (ref 3.80–5.10)
RDW: 17.5 % — ABNORMAL HIGH (ref 11.0–15.0)
Total Lymphocyte: 13.9 %
WBC mixed population: 690 cells/uL (ref 200–950)
WBC: 6 10*3/uL (ref 3.8–10.8)

## 2017-07-20 NOTE — Patient Instructions (Addendum)
CBC today.  OV in 4 months. Acute Mesenteric Ischemia Acute mesenteric ischemia is a sudden restriction in blood supply to the intestines. This can be a life-threatening condition. What are the causes? The condition may develop if something blocks a blood vessel that supports the intestines or causes it to narrow. It may also develop if blood flow to the intestines is reduced. Common causes of this condition include:  A blood clot.  Low blood pressure.  Heart disease.  Narrowing of the arteries from blood vessel (vascular) disease.  Shock.  Certain illegal and prescription drugs, such as cocaine or digoxin.  What increases the risk? This condition is more likely to develop in:  People who are over the age of 38 years.  People who have a history of coronary or vascular disease.  People who have an irregular heartbeat (arrhythmia).  People who smoke.  What are the signs or symptoms? Symptoms of this condition include:  Sudden, severe abdominal pain or bloating.  Blood in the stool.  Nausea.  Diarrhea that may be bloody.  Vomiting.  Fever.  How is this diagnosed? This condition may be diagnosed with:  A medical history.  A physical exam.  A surgical procedure to examine the organs inside the abdomen (exploratory laparotomy).  Tests, such as: ? X-rays. ? CT scans. ? Blood tests. ? Angiogram. This imaging test uses a dye to obtain a picture of blood flow to the intestine.  How is this treated? This condition is almost always treated with an emergency surgery to correct the problem or with a procedure that uses medicines to break up the clot. This information is not intended to replace advice given to you by your health care provider. Make sure you discuss any questions you have with your health care provider. Document Released: 02/14/2011 Document Revised: 12/03/2015 Document Reviewed: 08/21/2014 Elsevier Interactive Patient Education  [employer].

## 2017-07-20 NOTE — Progress Notes (Signed)
Subjective:    Patient ID: Shawna [name], female    DOB: [birth year], 77 y.o.   MRN: [id number]  HPI Here today for f/u after recent admission to [facility] in January of this year for vomiting and abdominal pain. The pain started 2 hrs prior to arrival to [facility].  Found to have probable  mesenteric ischemia on CT. She states it was the worse pain she ever had.   She received 2 units of blood while in the hospital for a hemoglobin of 7.9.  She states she is doing okay. Her appetite is good.  Weight has remained the same.  BMs daily. She ha not had any abdominal pain since discharge from [facility]. No nausea.  Stools are normal color.   Hx of ESRD on dialysis. CAD, CABG.Dialysis on M-W-F    Recent hx for GI bleed. Hx of AVMS.  EGD 07/29/2016:                            - 2 cm hiatal hernia.                           - Normal stomach.                           - A single duodenal versus jejunal polyp - suspect                            adenoma, not removed during this exam as above.                           - A single angiodysplastic lesion in the jejunum.                            This could be the cause for the patient's symptoms                            / anemia. Treated successfully with argon plasma                            coagulation ([facility]).   CBC    Component Value Date/Time   WBC 6.1 07/15/2017 0648   RBC 3.72 (L) 07/15/2017 0648   HGB 11.1 (L) 07/15/2017 0648   HCT 35.6 (L) 07/15/2017 0648   PLT 171 07/15/2017 0648   MCV 95.7 07/15/2017 0648   MCH 29.8 07/15/2017 0648   MCHC 31.2 07/15/2017 0648   RDW 20.9 (H) 07/15/2017 0648   LYMPHSABS 1.0 04/01/2017 0228   MONOABS 0.4 04/01/2017 0228   EOSABS 0.3 04/01/2017 0228   BASOSABS 0.0 04/01/2017 0228      Review of Systems Past Medical History:  Diagnosis Date  . Acute on chronic respiratory failure with hypoxia ([place]) 10/10/2016  . Anxiety   . Arthritis   . AVM (arteriovenous malformation) of colon   . CAD (coronary artery  disease)    a. 12/2011 NSTEMI/Cath/PCI LCX (2.25x14 Resolute DES) & D1 (2.25x22 Resolute DES);  b. 01/2012 Cath/PCI: LM 30, LAD 30p, 40-86m, D1 stent ok, 99 in sm branch of diag, LCX patent stent, OM1 20, RCA 95 ost (4.0x12 Promus DES),  EF 55%;  c. 04/2012 Lexi Cardiolite  EF 48%, small area of scar @ base/mid inflat wall with mild peri-infarct ischemia.; CABG 12/4  . Carotid artery disease ([place])    a. 99-37% LICA, 07/6965   . Chronic bronchitis ([name])   . Chronic diastolic CHF (congestive heart failure) ([place])    a. 02/2012 Echo EF 60-65%, nl wall motion, Gr 1 DD, mod MR  . Colon cancer ([place]) 1992  . Esophageal stricture   . ESRD on hemodialysis ([place])    ESRD due to HTN, started dialysis 2011 and gets HD at [facility] with Dr Hinda Lenis on MWF schedule.  Access is LUA AVF as of Sept 2014.   Marland Kitchen GERD (gastroesophageal reflux disease)   . High cholesterol 12/2011  . History of blood transfusion 07/2011; 12/2011; 01/2012 X 2; 04/2012  . History of gout   . History of lower GI bleeding   . Hypertension   . Iron deficiency anemia   . Mitral regurgitation    a. Moderate by echo, 02/2012  . Myocardial infarction ([place])   . Ovarian cancer ([place]) 1992  . Pneumonia ~ 2009  . PUD (peptic ulcer disease)   . TIA (transient ischemic attack)     Past Surgical History:  Procedure Laterality Date  . ABDOMINAL HYSTERECTOMY  1992  . APPENDECTOMY  06/1990  . AV FISTULA PLACEMENT  07/2009   left upper arm  . AV FISTULA PLACEMENT Right 09/06/2016   Procedure: RIGHT FOREARM ARTERIOVENOUS (AV) GRAFT;  Surgeon: Elam Dutch, MD;  Location: [facility] OR;  Service: Vascular;  Laterality: Right;  . AV FISTULA PLACEMENT N/A 02/24/2017   Procedure: INSERTION OF ARTERIOVENOUS (AV) GORE-TEX GRAFT ARM (BRACHIAL AXILLARY);  Surgeon: Katha Cabal, MD;  Location: [facility] ORS;  Service: Vascular;  Laterality: N/A;  . [place] Right 09/06/2016   Procedure: REMOVAL OF Right Arm ARTERIOVENOUS GORETEX GRAFT and Vein Patch angioplasty  of brachial artery;  Surgeon: Angelia Mould, MD;  Location: [place];  Service: Vascular;  Laterality: Right;  . COLON RESECTION  1992  . COLON SURGERY    . CORONARY ANGIOPLASTY WITH STENT PLACEMENT  12/15/11   "2"  . CORONARY ANGIOPLASTY WITH STENT PLACEMENT  y/2013   "1; makes total of 3" (05/02/2012)  . CORONARY ARTERY BYPASS GRAFT  06/13/2012   Procedure: CORONARY ARTERY BYPASS GRAFTING (CABG);  Surgeon: Grace Isaac, MD;  Location: [place];  Service: Open Heart Surgery;  Laterality: N/A;  cabg x four;  using left internal mammary artery, and left leg greater saphenous vein harvested endoscopically  . CORONARY STENT INTERVENTION N/A 10/13/2016   Procedure: Coronary Stent Intervention;  Surgeon: Troy Sine, MD;  Location: [place] CV LAB;  Service: Cardiovascular;  Laterality: N/A;  . DIALYSIS/PERMA CATHETER REMOVAL N/A 04/18/2017   Procedure: DIALYSIS/PERMA CATHETER REMOVAL;  Surgeon: Katha Cabal, MD;  Location: [place] CV LAB;  Service: Cardiovascular;  Laterality: N/A;  . DILATION AND CURETTAGE OF UTERUS    . ESOPHAGOGASTRODUODENOSCOPY  01/20/2012   Procedure: ESOPHAGOGASTRODUODENOSCOPY (EGD);  Surgeon: Ladene Artist, MD,FACG;  Location: [facility] ENDOSCOPY;  Service: Endoscopy;  Laterality: N/A;  . ESOPHAGOGASTRODUODENOSCOPY N/A 03/26/2013   Procedure: ESOPHAGOGASTRODUODENOSCOPY (EGD);  Surgeon: Irene Shipper, MD;  Location[facility] ENDOSCOPY;  Service: Endoscopy;  Laterality: N/A;  . ESOPHAGOGASTRODUODENOSCOPY N/A 04/30/2015   Procedure: ESOPHAGOGASTRODUODENOSCOPY (EGD);  Surgeon: Rogene Houston, MD;  Location: [facility]O [facility];  Service: Endoscopy;  Laterality: N/A;  1pm - moved to 10/20 @ 1:10  .  ESOPHAGOGASTRODUODENOSCOPY N/A 07/29/2016   Procedure: ESOPHAGOGASTRODUODENOSCOPY (EGD);  Surgeon: Manus Gunning, MD;  Location: [place];  Service: Gastroenterology;  Laterality: N/A;  enteroscopy  . INTRAOPERATIVE TRANSESOPHAGEAL ECHOCARDIOGRAM  06/13/2012   Procedure:  INTRAOPERATIVE TRANSESOPHAGEAL ECHOCARDIOGRAM;  Surgeon: Grace Isaac, MD;  Location: [place];  Service: Open Heart Surgery;  Laterality: N/A;  . IR GENERIC HISTORICAL  07/26/2016   IR FLUORO GUIDE CV LINE RIGHT 07/26/2016 Greggory Keen, MD [facility]-INTER[facility] RAD  . IR GENERIC HISTORICAL  07/26/2016   IR US GUIDE VASC ACCESS RIGHT 07/26/2016 Greggory Keen, MD [facility]  . IR GENERIC HISTORICAL  08/02/2016   IR US GUIDE VASC ACCESS RIGHT 08/02/2016 Greggory Keen, MD [facility]  . IR GENERIC HISTORICAL  08/02/2016   IR FLUORO GUIDE CV LINE RIGHT 08/02/2016 Greggory Keen, MD [facility]  . IR RADIOLOGY PERIPHERAL GUIDED IV START  03/28/2017  . IR US GUIDE VASC ACCESS RIGHT  03/28/2017  . LEFT HEART CATH AND CORONARY ANGIOGRAPHY N/A 09/20/2016   Procedure: Left Heart Cath and Coronary Angiography;  Surgeon: Shawna Crome, MD;  Location: [place] CV LAB;  Service: Cardiovascular;  Laterality: N/A;  . LEFT HEART CATH AND CORS/GRAFTS ANGIOGRAPHY N/A 10/13/2016   Procedure: Left Heart Cath and Cors/Grafts Angiography;  Surgeon: Troy Sine, MD;  Location: [place] CV LAB;  Service: Cardiovascular;  Laterality: N/A;  . LEFT HEART CATHETERIZATION WITH CORONARY ANGIOGRAM N/A 12/15/2011   Procedure: LEFT HEART CATHETERIZATION WITH CORONARY ANGIOGRAM;  Surgeon: Burnell Blanks, MD;  Location: [facility] CATH LAB;  Service: Cardiovascular;  Laterality: N/A;  . LEFT HEART CATHETERIZATION WITH CORONARY ANGIOGRAM N/A 01/10/2012   Procedure: LEFT HEART CATHETERIZATION WITH CORONARY ANGIOGRAM;  Surgeon: Peter M Martinique, MD;  Location: [facility] CATH LAB;  Service: Cardiovascular;  Laterality: N/A;  . LEFT HEART CATHETERIZATION WITH CORONARY ANGIOGRAM N/A 06/08/2012   Procedure: LEFT HEART CATHETERIZATION WITH CORONARY ANGIOGRAM;  Surgeon: Burnell Blanks, MD;  Location: [facility] CATH LAB;  Service: Cardiovascular;  Laterality: N/A;  . LEFT HEART CATHETERIZATION WITH CORONARY/GRAFT ANGIOGRAM N/A 12/10/2013   Procedure: LEFT  HEART CATHETERIZATION WITH Beatrix Fetters;  Surgeon: [name], MD;  Location: [facility] CATH LAB;  Service: Cardiovascular;  Laterality: N/A;  . OVARY SURGERY     ovarian cancer  . REVISION OF ARTERIOVENOUS GORETEX GRAFT N/A 02/24/2017   Procedure: REVISION OF ARTERIOVENOUS GORETEX GRAFT (RESECTION);  Surgeon: Katha Cabal, MD;  Location: [facility] ORS;  Service: Vascular;  Laterality: N/A;  [place] N/A 10/15/2013   Procedure: Fistulogram;  Surgeon: Serafina Mitchell, MD;  Location: [facility] CATH LAB;  Service: Cardiovascular;  Laterality: N/A;  . THROMBECTOMY / ARTERIOVENOUS GRAFT REVISION  2011   left upper arm  . TUBAL LIGATION  1980's  . UPPER EXTREMITY ANGIOGRAPHY Bilateral 12/06/2016   Procedure: Upper Extremity Angiography;  Surgeon: Katha Cabal, MD;  Location: [place] CV LAB;  Service: Cardiovascular;  Laterality: Bilateral;  . UPPER EXTREMITY INTERVENTION Left 06/06/2017   Procedure: UPPER EXTREMITY INTERVENTION;  Surgeon: Katha Cabal, MD;  Location: [place] CV LAB;  Service: Cardiovascular;  Laterality: Left;    Allergies  Allergen Reactions  . Aspirin Other (See Comments)    High Doses Mess up her stomach; "makes my bowels have blood in them". Takes 81 mg EC Aspirin   . Penicillins Other (See Comments)    SYNCOPE? , "makes me real weak when I take it; like I'll pass out"  Has patient had a PCN reaction causing immediate  rash, facial/tongue/throat swelling, SOB or lightheadedness with hypotension: Yes Has patient had a PCN reaction causing severe rash involving mucus membranes or skin necrosis: no Has patient had a PCN reaction that required hospitalization no Has patient had a PCN reaction occurring within the last 10 years: no If all of the above  . Amlodipine Swelling  . Bactrim [Sulfamethoxazole-Trimethoprim] Rash  . Contrast Media [Iodinated Diagnostic Agents] Itching  . Iron Itching and Other (See Comments)    "they gave me iron in  dialysis; had to give me Benadryl cause I had to have the iron" (05/02/2012)  . Nitrofurantoin Hives  . Tylenol [Acetaminophen] Itching and Other (See Comments)    Makes her feet on fire per pt  . Gabapentin Other (See Comments)    Unknown reaction  . Dexilant [Dexlansoprazole] Other (See Comments)    Upset stomach  . Levaquin [Levofloxacin In D5w] Rash  . Morphine And Related Itching and Other (See Comments)    Itching in feet  . Plavix [Clopidogrel Bisulfate] Rash  . Protonix [Pantoprazole Sodium] Rash  . Venofer [Ferric Oxide] Itching and Other (See Comments)    Patient reports using Benadryl prior to doses as [facility]    Current Outpatient Medications on File Prior to Visit  Medication Sig Dispense Refill  . ALPRAZolam (XANAX) 0.25 MG tablet Take 1 tablet (0.25 mg total) by mouth 2 (two) times daily as needed for anxiety or sleep. 10 tablet 0  . amiodarone (PACERONE) 200 MG tablet Take 1 tablet (200 mg total) by mouth daily. 30 tablet 3  . carvedilol (COREG) 6.25 MG tablet TAKE 1 TABLET BY MOUTH TWICE DAILY WITH  A  MEAL (Patient taking differently: TAKE 1 TABLET (6.25 MG) BY MOUTH TWICE DAILY WITH  A  MEAL) 180 tablet 3  . cinacalcet (SENSIPAR) 30 MG tablet Take 30 mg by mouth daily after supper.    . cloNIDine (CATAPRES) 0.2 MG tablet Take 0.2 mg by mouth daily as needed (high blood pressure).     . diphenhydrAMINE (BENADRYL) 25 mg capsule Take 50 mg by mouth daily as needed for itching or allergies.     Marland Kitchen epoetin alfa (EPOGEN,PROCRIT) 03474 UNIT/ML injection Inject 1 mL (10,000 Units total) into the vein every Monday, Wednesday, and Friday with hemodialysis. 1 mL 10  . ferrous sulfate 325 (65 FE) MG EC tablet Take 1 tablet (325 mg total) by mouth 2 (two) times daily with a meal. 60 tablet 0  . fluticasone (FLONASE) 50 MCG/ACT nasal spray Place 1 spray at bedtime as needed into both nostrils for allergies.     . hydrALAZINE (APRESOLINE) 50 MG tablet Take 50 mg by mouth 2 (two)  times daily as needed (high blood pressure).    . isosorbide mononitrate (IMDUR) 120 MG 24 hr tablet Take 1 tablet (120 mg total) by mouth daily. 30 tablet 6  . lidocaine-prilocaine (EMLA) cream Apply 1 application topically every Monday, Wednesday, and Friday. Prior to dialysis    . multivitamin (RENA-VIT) TABS tablet Take 1 tablet by mouth daily.    . nitroGLYCERIN (NITROSTAT) 0.4 MG SL tablet Place 1 tablet (0.4 mg total) under the tongue every 5 (five) minutes as needed for chest pain. 25 tablet 3  . nystatin-triamcinolone (MYCOLOG II) cream Apply 1 application topically 2 (two) times daily. (Patient taking differently: Apply 1 application topically 2 (two) times daily as needed (itching/rash). ) 30 g 1  . omeprazole (PRILOSEC) 20 MG capsule Take 20 mg by mouth daily.    Marland Kitchen  ondansetron (ZOFRAN-ODT) 4 MG disintegrating tablet Take 4 mg by mouth every 8 (eight) hours as needed for nausea or vomiting.     . sevelamer carbonate (RENVELA) 800 MG tablet Take 800-2,400 mg by mouth See admin instructions. Take 3 tablets (2400) by mouth twice daily with meals, take 1 tablet (800 mg) with snacks    . simvastatin (ZOCOR) 20 MG tablet TAKE ONE TABLET BY MOUTH AT BEDTIME (Patient taking differently: TAKE ONE TABLET (20 MG) BY MOUTH DAILY AT BEDTIME) 90 tablet 3  . ticagrelor (BRILINTA) 90 MG TABS tablet Take 90 mg by mouth 2 (two) times daily.     No current facility-administered medications on file prior to visit.         Objective:   Physical Exam Blood pressure (!) 160/60, pulse 64, temperature 98 F (36.7 C), height 5\' 1"  (1.549 m), weight 129 lb 3.2 oz (58.6 kg). Alert and oriented. Skin warm and dry. Oral mucosa is moist.   . Sclera anicteric, conjunctivae is pink. Thyroid not enlarged. No cervical lymphadenopathy. Lungs clear. Heart regular rate and rhythm.  Abdomen is soft. Bowel sounds are positive. No hepatomegaly. No abdominal masses felt. No tenderness.  No edema to lower extremities.  Walks  with a walker.          Assessment & Plan:  Hx of anemia. Anemia of chronic disease and also GI blood loss. Hemoglobin in 07/15/2017 was 11.1. Will repeat a CBC today. Did receive 2 units of PRBCs while in hospital this month. OV in 4 months.  Possible mesenteric ischemia: f abdominal pain reoccurs, go to the ED.  Two stool cards sent home with patient.

## 2017-07-24 ENCOUNTER — Other Ambulatory Visit (INDEPENDENT_AMBULATORY_CARE_PROVIDER_SITE_OTHER): Payer: Self-pay | Admitting: *Deleted

## 2017-07-24 DIAGNOSIS — D500 Iron deficiency anemia secondary to blood loss (chronic): Secondary | ICD-10-CM

## 2017-08-01 ENCOUNTER — Ambulatory Visit (INDEPENDENT_AMBULATORY_CARE_PROVIDER_SITE_OTHER): Payer: Medicare HMO | Admitting: Vascular Surgery

## 2017-08-01 ENCOUNTER — Ambulatory Visit (INDEPENDENT_AMBULATORY_CARE_PROVIDER_SITE_OTHER): Payer: Medicare HMO

## 2017-08-01 ENCOUNTER — Encounter (INDEPENDENT_AMBULATORY_CARE_PROVIDER_SITE_OTHER): Payer: Self-pay | Admitting: Vascular Surgery

## 2017-08-01 VITALS — BP 155/60 | HR 66 | Resp 14 | Ht 61.0 in | Wt 132.0 lb

## 2017-08-01 DIAGNOSIS — N185 Chronic kidney disease, stage 5: Secondary | ICD-10-CM | POA: Diagnosis not present

## 2017-08-01 DIAGNOSIS — Z992 Dependence on renal dialysis: Secondary | ICD-10-CM | POA: Diagnosis not present

## 2017-08-01 DIAGNOSIS — N186 End stage renal disease: Secondary | ICD-10-CM | POA: Diagnosis not present

## 2017-08-01 DIAGNOSIS — T82898S Other specified complication of vascular prosthetic devices, implants and grafts, sequela: Secondary | ICD-10-CM | POA: Diagnosis not present

## 2017-08-01 DIAGNOSIS — T82898A Other specified complication of vascular prosthetic devices, implants and grafts, initial encounter: Secondary | ICD-10-CM | POA: Insufficient documentation

## 2017-08-01 NOTE — Progress Notes (Signed)
Subjective:    Patient ID: Shawna [name], female    DOB: [birth year], 77 y.o.   MRN: [id number] Chief Complaint  Patient presents with  . Follow-up    2 week [facility] f/u   The patient presents for her first post procedure follow-up.  The patient was experiencing left hand steal syndrome before the intervention.  She presents today without complaint.  The patient denies any issues with dialysis.  The patient denies any left upper extremity pain.  The patient denies any left hand pain or ulceration.  The patient underwent a left upper extremity dialysis access which was notable for a patent left brachial axillary AV graft and anastomosis.  Mild narrowing of the perianastomotic region however the patient's flow-volume is 1373.  The patient denies any fever, nausea or vomiting.   Review of Systems  Constitutional: Negative.   HENT: Negative.   Eyes: Negative.   Respiratory: Negative.   Cardiovascular: Negative.   Gastrointestinal: Negative.   Endocrine: Negative.   Genitourinary: Negative.   Musculoskeletal: Negative.   Skin: Negative.   Allergic/Immunologic: Negative.   Neurological: Negative.   Hematological: Negative.   Psychiatric/Behavioral: Negative.       Objective:   Physical Exam  Constitutional: She is oriented to person, place, and time. She appears well-developed and well-nourished. No distress.  HENT:  Head: Normocephalic and atraumatic.  Eyes: Conjunctivae are normal. Pupils are equal, round, and reactive to light.  Neck: Normal range of motion.  Cardiovascular: Normal rate, regular rhythm, normal heart sounds and intact distal pulses.  Pulses:      Radial pulses are 2+ on the right side, and 2+ on the left side.  Upper extremity brachial axillary graft: Good bruit and thrill.  Skin is intact.  Pulmonary/Chest: Effort normal and breath sounds normal.  Musculoskeletal: Normal range of motion. She exhibits no edema.  Neurological: She is alert and oriented to person,  place, and time.  Skin: Skin is warm and dry. She is not diaphoretic.  Psychiatric: She has a normal mood and affect. Her behavior is normal. Judgment and thought content normal.  Vitals reviewed.  BP (!) 155/60 (BP Location: Right Arm, Patient Position: Sitting)   Pulse 66   Resp 14   Ht 5\' 1"  (1.549 m)   Wt 132 lb (59.9 kg)   BMI 24.94 kg/m   Past Medical History:  Diagnosis Date  . Acute on chronic respiratory failure with hypoxia ([place]) 10/10/2016  . Anxiety   . Arthritis   . AVM (arteriovenous malformation) of colon   . CAD (coronary artery disease)    a. 12/2011 NSTEMI/Cath/PCI LCX (2.25x14 Resolute DES) & D1 (2.25x22 Resolute DES);  b. 01/2012 Cath/PCI: LM 30, LAD 30p, 40-37m, D1 stent ok, 99 in sm branch of diag, LCX patent stent, OM1 20, RCA 95 ost (4.0x12 Promus DES), EF 55%;  c. 04/2012 Lexi Cardiolite  EF 48%, small area of scar @ base/mid inflat wall with mild peri-infarct ischemia.; CABG 12/4  . Carotid artery disease ([place])    a. 01-75% LICA, 07/256   . Chronic bronchitis ([place])   . Chronic diastolic CHF (congestive heart failure) ([place])    a. 02/2012 Echo EF 60-65%, nl wall motion, Gr 1 DD, mod MR  . Colon cancer ([place]) 1992  . Esophageal stricture   . ESRD on hemodialysis ([place])    ESRD due to HTN, started dialysis 2011 and gets HD at [facility] with Dr Hinda Lenis on MWF schedule.  Access is LUA AVF  as of Sept 2014.   Marland Kitchen GERD (gastroesophageal reflux disease)   . High cholesterol 12/2011  . History of blood transfusion 07/2011; 12/2011; 01/2012 X 2; 04/2012  . History of gout   . History of lower GI bleeding   . Hypertension   . Iron deficiency anemia   . Mitral regurgitation    a. Moderate by echo, 02/2012  . Myocardial infarction ([place])   . Ovarian cancer ([place]) 1992  . Pneumonia ~ 2009  . PUD (peptic ulcer disease)   . TIA (transient ischemic attack)    Social History   Socioeconomic History  . Marital status: Married    Spouse name: Not on file  . Number of  children: Not on file  . Years of education: Not on file  . Highest education level: Not on file  Social Needs  . Financial resource strain: Not on file  . Food insecurity - worry: Not on file  . Food insecurity - inability: Not on file  . Transportation needs - medical: Not on file  . Transportation needs - non-medical: Not on file  Occupational History  . Not on file  Tobacco Use  . Smoking status: Never Smoker  . Smokeless tobacco: Never Used  Substance and Sexual Activity  . Alcohol use: No    Alcohol/week: 0.0 oz  . Drug use: No  . Sexual activity: Yes    Birth control/protection: Surgical  Other Topics Concern  . Not on file  Social History Narrative   Lives in [place], [place] with husband.  Dialysis pt - [facility].   Past Surgical History:  Procedure Laterality Date  . ABDOMINAL HYSTERECTOMY  1992  . APPENDECTOMY  06/1990  . AV FISTULA PLACEMENT  07/2009   left upper arm  . AV FISTULA PLACEMENT Right 09/06/2016   Procedure: RIGHT FOREARM ARTERIOVENOUS (AV) GRAFT;  Surgeon: Elam Dutch, MD;  Location: [facility] OR;  Service: Vascular;  Laterality: Right;  . AV FISTULA PLACEMENT N/A 02/24/2017   Procedure: INSERTION OF ARTERIOVENOUS (AV) GORE-TEX GRAFT ARM (BRACHIAL AXILLARY);  Surgeon: Katha Cabal, MD;  Location: [facility] ORS;  Service: Vascular;  Laterality: N/A;  . [place] Right 09/06/2016   Procedure: REMOVAL OF Right Arm ARTERIOVENOUS GORETEX GRAFT and Vein Patch angioplasty of brachial artery;  Surgeon: Angelia Mould, MD;  Location: [place];  Service: Vascular;  Laterality: Right;  . COLON RESECTION  1992  . COLON SURGERY    . CORONARY ANGIOPLASTY WITH STENT PLACEMENT  12/15/11   "2"  . CORONARY ANGIOPLASTY WITH STENT PLACEMENT  y/2013   "1; makes total of 3" (05/02/2012)  . CORONARY ARTERY BYPASS GRAFT  06/13/2012   Procedure: CORONARY ARTERY BYPASS GRAFTING (CABG);  Surgeon: Grace Isaac, MD;  Location: [place];  Service: Open Heart Surgery;  Laterality: N/A;   cabg x four;  using left internal mammary artery, and left leg greater saphenous vein harvested endoscopically  . CORONARY STENT INTERVENTION N/A 10/13/2016   Procedure: Coronary Stent Intervention;  Surgeon: Troy Sine, MD;  Location: [place] CV LAB;  Service: Cardiovascular;  Laterality: N/A;  . DIALYSIS/PERMA CATHETER REMOVAL N/A 04/18/2017   Procedure: DIALYSIS/PERMA CATHETER REMOVAL;  Surgeon: Katha Cabal, MD;  Location: [place] CV LAB;  Service: Cardiovascular;  Laterality: N/A;  . DILATION AND CURETTAGE OF UTERUS    . ESOPHAGOGASTRODUODENOSCOPY  01/20/2012   Procedure: ESOPHAGOGASTRODUODENOSCOPY (EGD);  Surgeon: Ladene Artist, MD,FACG;  Location[facility] ENDOSCOPY;  Service: Endoscopy;  Laterality: N/A;  .  ESOPHAGOGASTRODUODENOSCOPY N/A 03/26/2013   Procedure: ESOPHAGOGASTRODUODENOSCOPY (EGD);  Surgeon: Irene Shipper, MD;  Location: [facility] ENDOSCOPY;  Service: Endoscopy;  Laterality: N/A;  . ESOPHAGOGASTRODUODENOSCOPY N/A 04/30/2015   Procedure: ESOPHAGOGASTRODUODENOSCOPY (EGD);  Surgeon: Rogene Houston, MD;  [facility];  Service: Endoscopy;  Laterality: N/A;  1pm - moved to 10/20 @ 1:10  . ESOPHAGOGASTRODUODENOSCOPY N/A 07/29/2016   Procedure: ESOPHAGOGASTRODUODENOSCOPY (EGD);  Surgeon: Manus Gunning, MD;  Location: [place];  Service: Gastroenterology;  Laterality: N/A;  enteroscopy  . INTRAOPERATIVE TRANSESOPHAGEAL ECHOCARDIOGRAM  06/13/2012   Procedure: INTRAOPERATIVE TRANSESOPHAGEAL ECHOCARDIOGRAM;  Surgeon: Grace Isaac, MD;  Location: [place];  Service: Open Heart Surgery;  Laterality: N/A;  . IR GENERIC HISTORICAL  07/26/2016   IR FLUORO GUIDE CV LINE RIGHT 07/26/2016 Greggory Keen, MD [facility]-INTER[facility] RAD  . IR GENERIC HISTORICAL  07/26/2016   IR US GUIDE VASC ACCESS RIGHT 07/26/2016 Greggory Keen, MD [facility]  . IR GENERIC HISTORICAL  08/02/2016   IR US GUIDE VASC ACCESS RIGHT 08/02/2016 Greggory Keen, MD [facility]  . IR GENERIC HISTORICAL   08/02/2016   IR FLUORO GUIDE CV LINE RIGHT 08/02/2016 Greggory Keen, MD [facility]  . IR RADIOLOGY PERIPHERAL GUIDED IV START  03/28/2017  . IR US GUIDE VASC ACCESS RIGHT  03/28/2017  . LEFT HEART CATH AND CORONARY ANGIOGRAPHY N/A 09/20/2016   Procedure: Left Heart Cath and Coronary Angiography;  Surgeon: Shawna Crome, MD;  Location: [place] CV LAB;  Service: Cardiovascular;  Laterality: N/A;  . LEFT HEART CATH AND CORS/GRAFTS ANGIOGRAPHY N/A 10/13/2016   Procedure: Left Heart Cath and Cors/Grafts Angiography;  Surgeon: Troy Sine, MD;  Location: [place] CV LAB;  Service: Cardiovascular;  Laterality: N/A;  . LEFT HEART CATHETERIZATION WITH CORONARY ANGIOGRAM N/A 12/15/2011   Procedure: LEFT HEART CATHETERIZATION WITH CORONARY ANGIOGRAM;  Surgeon: Burnell Blanks, MD;  Location: [facility] CATH LAB;  Service: Cardiovascular;  Laterality: N/A;  . LEFT HEART CATHETERIZATION WITH CORONARY ANGIOGRAM N/A 01/10/2012   Procedure: LEFT HEART CATHETERIZATION WITH CORONARY ANGIOGRAM;  Surgeon: Peter M Martinique, MD;  Location: [facility] CATH LAB;  Service: Cardiovascular;  Laterality: N/A;  . LEFT HEART CATHETERIZATION WITH CORONARY ANGIOGRAM N/A 06/08/2012   Procedure: LEFT HEART CATHETERIZATION WITH CORONARY ANGIOGRAM;  Surgeon: Burnell Blanks, MD;  Location: [facility] CATH LAB;  Service: Cardiovascular;  Laterality: N/A;  . LEFT HEART CATHETERIZATION WITH CORONARY/GRAFT ANGIOGRAM N/A 12/10/2013   Procedure: LEFT HEART CATHETERIZATION WITH Beatrix Fetters;  Surgeon: [name], MD;  Location: [facility] CATH LAB;  Service: Cardiovascular;  Laterality: N/A;  . OVARY SURGERY     ovarian cancer  . REVISION OF ARTERIOVENOUS GORETEX GRAFT N/A 02/24/2017   Procedure: REVISION OF ARTERIOVENOUS GORETEX GRAFT (RESECTION);  Surgeon: Katha Cabal, MD;  Location: [facility] ORS;  Service: Vascular;  Laterality: N/A;  Earney Mal[place] N/A 10/15/2013   Procedure: Fistulogram;  Surgeon: Serafina Mitchell, MD;  Location: [facility]  CATH LAB;  Service: Cardiovascular;  Laterality: N/A;  . THROMBECTOMY / ARTERIOVENOUS GRAFT REVISION  2011   left upper arm  . TUBAL LIGATION  1980's  . UPPER EXTREMITY ANGIOGRAPHY Bilateral 12/06/2016   Procedure: Upper Extremity Angiography;  Surgeon: Katha Cabal, MD;  Location: [place] CV LAB;  Service: Cardiovascular;  Laterality: Bilateral;  . UPPER EXTREMITY INTERVENTION Left 06/06/2017   Procedure: UPPER EXTREMITY INTERVENTION;  Surgeon: Katha Cabal, MD;  Location: [place] CV LAB;  Service: Cardiovascular;  Laterality: Left;   Family History  Problem  Relation Age of Onset  . Heart disease Mother        Heart Disease before age 80  . Hyperlipidemia Mother   . Hypertension Mother   . Diabetes Mother   . Heart attack Mother   . Heart disease Father        Heart Disease before age 40  . Hyperlipidemia Father   . Hypertension Father   . Diabetes Father   . Diabetes Sister   . Hypertension Sister   . Diabetes Brother   . Hyperlipidemia Brother   . Heart attack Brother   . Hypertension Sister   . Heart attack Brother   . Other Unknown        noncontributory for early CAD  . Colon cancer Neg Hx   . Esophageal cancer Neg Hx   . Liver disease Neg Hx   . Kidney disease Neg Hx   . Colon polyps Neg Hx    Allergies  Allergen Reactions  . Aspirin Other (See Comments)    High Doses Mess up her stomach; "makes my bowels have blood in them". Takes 81 mg EC Aspirin   . Penicillins Other (See Comments)    SYNCOPE? , "makes me real weak when I take it; like I'll pass out"  Has patient had a PCN reaction causing immediate rash, facial/tongue/throat swelling, SOB or lightheadedness with hypotension: Yes Has patient had a PCN reaction causing severe rash involving mucus membranes or skin necrosis: no Has patient had a PCN reaction that required hospitalization no Has patient had a PCN reaction occurring within the last 10 years: no If all of the above  .  Amlodipine Swelling  . Bactrim [Sulfamethoxazole-Trimethoprim] Rash  . Contrast Media [Iodinated Diagnostic Agents] Itching  . Iron Itching and Other (See Comments)    "they gave me iron in dialysis; had to give me Benadryl cause I had to have the iron" (05/02/2012)  . Nitrofurantoin Hives  . Tylenol [Acetaminophen] Itching and Other (See Comments)    Makes her feet on fire per pt  . Gabapentin Other (See Comments)    Unknown reaction  . Dexilant [Dexlansoprazole] Other (See Comments)    Upset stomach  . Levaquin [Levofloxacin In D5w] Rash  . Morphine And Related Itching and Other (See Comments)    Itching in feet  . Plavix [Clopidogrel Bisulfate] Rash  . Protonix [Pantoprazole Sodium] Rash  . Venofer [Ferric Oxide] Itching and Other (See Comments)    Patient reports using Benadryl prior to doses as [place]:  The patient presents for her first post procedure follow-up.  The patient was experiencing left hand steal syndrome before the intervention.  She presents today without complaint.  The patient denies any issues with dialysis.  The patient denies any left upper extremity pain.  The patient denies any left hand pain or ulceration.  The patient underwent a left upper extremity dialysis access which was notable for a patent left brachial axillary AV graft and anastomosis.  Mild narrowing of the perianastomotic region however the patient's flow-volume is 1373.  The patient denies any fever, nausea or vomiting.  1. ESRD on hemodialysis ([place]) - Stable Patient presents with improved left hand steal syndrome symptoms The patient has 2+ left radial pulses Patient with a good bruit and thrill to the left upper extremity dialysis access Patient denies any issues during dialysis Patient with a patent brachial axillary graft on duplex today No indication for intervention at  this time The patient is to follow-up in 6 months with an HDA  - VAS [place] (AVF,AVG); Future  2. Steal syndrome of hand, sequela - Improved As above  Current Outpatient Medications on File Prior to Visit  Medication Sig Dispense Refill  . ALPRAZolam (XANAX) 0.25 MG tablet Take 1 tablet (0.25 mg total) by mouth 2 (two) times daily as needed for anxiety or sleep. 10 tablet 0  . amiodarone (PACERONE) 200 MG tablet Take 1 tablet (200 mg total) by mouth daily. 30 tablet 3  . carvedilol (COREG) 6.25 MG tablet TAKE 1 TABLET BY MOUTH TWICE DAILY WITH  A  MEAL (Patient taking differently: TAKE 1 TABLET (6.25 MG) BY MOUTH TWICE DAILY WITH  A  MEAL) 180 tablet 3  . cinacalcet (SENSIPAR) 30 MG tablet Take 30 mg by mouth daily after supper.    . cloNIDine (CATAPRES) 0.2 MG tablet Take 0.2 mg by mouth daily as needed (high blood pressure).     . diphenhydrAMINE (BENADRYL) 25 mg capsule Take 50 mg by mouth daily as needed for itching or allergies.     Marland Kitchen epoetin alfa (EPOGEN,PROCRIT) 16109 UNIT/ML injection Inject 1 mL (10,000 Units total) into the vein every Monday, Wednesday, and Friday with hemodialysis. 1 mL 10  . ferrous sulfate 325 (65 FE) MG EC tablet Take 1 tablet (325 mg total) by mouth 2 (two) times daily with a meal. 60 tablet 0  . fluticasone (FLONASE) 50 MCG/ACT nasal spray Place 1 spray at bedtime as needed into both nostrils for allergies.     . hydrALAZINE (APRESOLINE) 50 MG tablet Take 50 mg by mouth 2 (two) times daily as needed (high blood pressure).    . isosorbide mononitrate (IMDUR) 120 MG 24 hr tablet Take 1 tablet (120 mg total) by mouth daily. 30 tablet 6  . lidocaine-prilocaine (EMLA) cream Apply 1 application topically every Monday, Wednesday, and Friday. Prior to dialysis    . multivitamin (RENA-VIT) TABS tablet Take 1 tablet by mouth daily.    . nitroGLYCERIN (NITROSTAT) 0.4 MG SL tablet Place 1 tablet (0.4 mg total) under the tongue every 5 (five) minutes as needed for chest pain. 25 tablet 3  . nystatin-triamcinolone (MYCOLOG II) cream Apply 1  application topically 2 (two) times daily. (Patient taking differently: Apply 1 application topically 2 (two) times daily as needed (itching/rash). ) 30 g 1  . omeprazole (PRILOSEC) 20 MG capsule Take 20 mg by mouth daily.    . ondansetron (ZOFRAN-ODT) 4 MG disintegrating tablet Take 4 mg by mouth every 8 (eight) hours as needed for nausea or vomiting.     . sevelamer carbonate (RENVELA) 800 MG tablet Take 800-2,400 mg by mouth See admin instructions. Take 3 tablets (2400) by mouth twice daily with meals, take 1 tablet (800 mg) with snacks    . simvastatin (ZOCOR) 20 MG tablet TAKE ONE TABLET BY MOUTH AT BEDTIME (Patient taking differently: TAKE ONE TABLET (20 MG) BY MOUTH DAILY AT BEDTIME) 90 tablet 3  . ticagrelor (BRILINTA) 90 MG TABS tablet Take 90 mg by mouth 2 (two) times daily.     No current facility-administered medications on file prior to visit.    There are no Patient Instructions on file for this visit. No Follow-up on file.  Melysa Schroyer A Akio Hudnall, PA-C

## 2017-08-04 ENCOUNTER — Encounter (INDEPENDENT_AMBULATORY_CARE_PROVIDER_SITE_OTHER): Payer: Self-pay | Admitting: *Deleted

## 2017-08-04 ENCOUNTER — Other Ambulatory Visit (INDEPENDENT_AMBULATORY_CARE_PROVIDER_SITE_OTHER): Payer: Self-pay | Admitting: *Deleted

## 2017-08-04 DIAGNOSIS — D500 Iron deficiency anemia secondary to blood loss (chronic): Secondary | ICD-10-CM

## 2017-08-07 DIAGNOSIS — Z992 Dependence on renal dialysis: Secondary | ICD-10-CM | POA: Diagnosis not present

## 2017-08-07 DIAGNOSIS — N186 End stage renal disease: Secondary | ICD-10-CM | POA: Diagnosis not present

## 2017-08-08 ENCOUNTER — Ambulatory Visit: Payer: Medicare HMO | Admitting: Urology

## 2017-08-10 DIAGNOSIS — N186 End stage renal disease: Secondary | ICD-10-CM | POA: Diagnosis not present

## 2017-08-10 DIAGNOSIS — Z992 Dependence on renal dialysis: Secondary | ICD-10-CM | POA: Diagnosis not present

## 2017-08-11 DIAGNOSIS — N186 End stage renal disease: Secondary | ICD-10-CM | POA: Diagnosis not present

## 2017-08-11 DIAGNOSIS — Z6824 Body mass index (BMI) 24.0-24.9, adult: Secondary | ICD-10-CM | POA: Diagnosis not present

## 2017-08-11 DIAGNOSIS — R05 Cough: Secondary | ICD-10-CM | POA: Diagnosis not present

## 2017-08-11 DIAGNOSIS — N185 Chronic kidney disease, stage 5: Secondary | ICD-10-CM | POA: Diagnosis not present

## 2017-08-11 DIAGNOSIS — Z992 Dependence on renal dialysis: Secondary | ICD-10-CM | POA: Diagnosis not present

## 2017-08-13 ENCOUNTER — Emergency Department (HOSPITAL_COMMUNITY): Payer: Medicare HMO

## 2017-08-13 ENCOUNTER — Other Ambulatory Visit: Payer: Self-pay

## 2017-08-13 ENCOUNTER — Encounter (HOSPITAL_COMMUNITY): Payer: Self-pay | Admitting: *Deleted

## 2017-08-13 ENCOUNTER — Emergency Department (HOSPITAL_COMMUNITY)
Admission: EM | Admit: 2017-08-13 | Discharge: 2017-08-13 | Disposition: A | Discharge status: Home / Self Care | Payer: Medicare HMO | Attending: Emergency Medicine | Admitting: Emergency Medicine

## 2017-08-13 DIAGNOSIS — J208 Acute bronchitis due to other specified organisms: Secondary | ICD-10-CM | POA: Insufficient documentation

## 2017-08-13 DIAGNOSIS — I25118 Atherosclerotic heart disease of native coronary artery with other forms of angina pectoris: Secondary | ICD-10-CM | POA: Insufficient documentation

## 2017-08-13 DIAGNOSIS — J3489 Other specified disorders of nose and nasal sinuses: Secondary | ICD-10-CM | POA: Diagnosis not present

## 2017-08-13 DIAGNOSIS — N186 End stage renal disease: Secondary | ICD-10-CM | POA: Insufficient documentation

## 2017-08-13 DIAGNOSIS — I5032 Chronic diastolic (congestive) heart failure: Secondary | ICD-10-CM | POA: Insufficient documentation

## 2017-08-13 DIAGNOSIS — I132 Hypertensive heart and chronic kidney disease with heart failure and with stage 5 chronic kidney disease, or end stage renal disease: Secondary | ICD-10-CM | POA: Diagnosis not present

## 2017-08-13 DIAGNOSIS — D649 Anemia, unspecified: Secondary | ICD-10-CM | POA: Diagnosis not present

## 2017-08-13 DIAGNOSIS — E1122 Type 2 diabetes mellitus with diabetic chronic kidney disease: Secondary | ICD-10-CM | POA: Diagnosis not present

## 2017-08-13 DIAGNOSIS — I255 Ischemic cardiomyopathy: Secondary | ICD-10-CM | POA: Diagnosis not present

## 2017-08-13 DIAGNOSIS — R197 Diarrhea, unspecified: Secondary | ICD-10-CM | POA: Insufficient documentation

## 2017-08-13 DIAGNOSIS — Z992 Dependence on renal dialysis: Secondary | ICD-10-CM | POA: Diagnosis not present

## 2017-08-13 DIAGNOSIS — Z8673 Personal history of transient ischemic attack (TIA), and cerebral infarction without residual deficits: Secondary | ICD-10-CM | POA: Insufficient documentation

## 2017-08-13 DIAGNOSIS — E1165 Type 2 diabetes mellitus with hyperglycemia: Secondary | ICD-10-CM | POA: Diagnosis not present

## 2017-08-13 DIAGNOSIS — Z951 Presence of aortocoronary bypass graft: Secondary | ICD-10-CM | POA: Diagnosis not present

## 2017-08-13 DIAGNOSIS — R0609 Other forms of dyspnea: Secondary | ICD-10-CM | POA: Insufficient documentation

## 2017-08-13 DIAGNOSIS — I10 Essential (primary) hypertension: Secondary | ICD-10-CM | POA: Diagnosis not present

## 2017-08-13 DIAGNOSIS — I4892 Unspecified atrial flutter: Secondary | ICD-10-CM | POA: Diagnosis not present

## 2017-08-13 DIAGNOSIS — J029 Acute pharyngitis, unspecified: Secondary | ICD-10-CM | POA: Diagnosis not present

## 2017-08-13 DIAGNOSIS — J189 Pneumonia, unspecified organism: Secondary | ICD-10-CM | POA: Diagnosis not present

## 2017-08-13 DIAGNOSIS — I259 Chronic ischemic heart disease, unspecified: Secondary | ICD-10-CM | POA: Diagnosis not present

## 2017-08-13 DIAGNOSIS — R1084 Generalized abdominal pain: Secondary | ICD-10-CM | POA: Diagnosis not present

## 2017-08-13 DIAGNOSIS — R05 Cough: Secondary | ICD-10-CM | POA: Diagnosis not present

## 2017-08-13 LAB — CBC
HCT: 37.2 % (ref 36.0–46.0)
HEMOGLOBIN: 11.5 g/dL — AB (ref 12.0–15.0)
MCH: 31.2 pg (ref 26.0–34.0)
MCHC: 30.9 g/dL (ref 30.0–36.0)
MCV: 100.8 fL — AB (ref 78.0–100.0)
Platelets: 194 10*3/uL (ref 150–400)
RBC: 3.69 MIL/uL — AB (ref 3.87–5.11)
RDW: 19.5 % — ABNORMAL HIGH (ref 11.5–15.5)
WBC: 5.4 10*3/uL (ref 4.0–10.5)

## 2017-08-13 LAB — COMPREHENSIVE METABOLIC PANEL
ALBUMIN: 3.3 g/dL — AB (ref 3.5–5.0)
ALT: 23 U/L (ref 14–54)
ANION GAP: 16 — AB (ref 5–15)
AST: 26 U/L (ref 15–41)
Alkaline Phosphatase: 49 U/L (ref 38–126)
BUN: 34 mg/dL — ABNORMAL HIGH (ref 6–20)
CHLORIDE: 100 mmol/L — AB (ref 101–111)
CO2: 24 mmol/L (ref 22–32)
Calcium: 11.1 mg/dL — ABNORMAL HIGH (ref 8.9–10.3)
Creatinine, Ser: 8.53 mg/dL — ABNORMAL HIGH (ref 0.44–1.00)
GFR calc non Af Amer: 4 mL/min — ABNORMAL LOW (ref >60)
GFR, EST AFRICAN AMERICAN: 5 mL/min — AB (ref >60)
GLUCOSE: 105 mg/dL — AB (ref 65–99)
Potassium: 4.1 mmol/L (ref 3.5–5.1)
SODIUM: 140 mmol/L (ref 135–145)
Total Bilirubin: 1 mg/dL (ref 0.3–1.2)
Total Protein: 6.3 g/dL — ABNORMAL LOW (ref 6.5–8.1)

## 2017-08-13 IMAGING — CR DG CHEST 2V
2 series · 2 of 2 positions shown · non-contrast
Comparison: [DATE]

CLINICAL DATA: Cough for 3-4 days.

EXAM:
CHEST  2 VIEW

[chest lat]
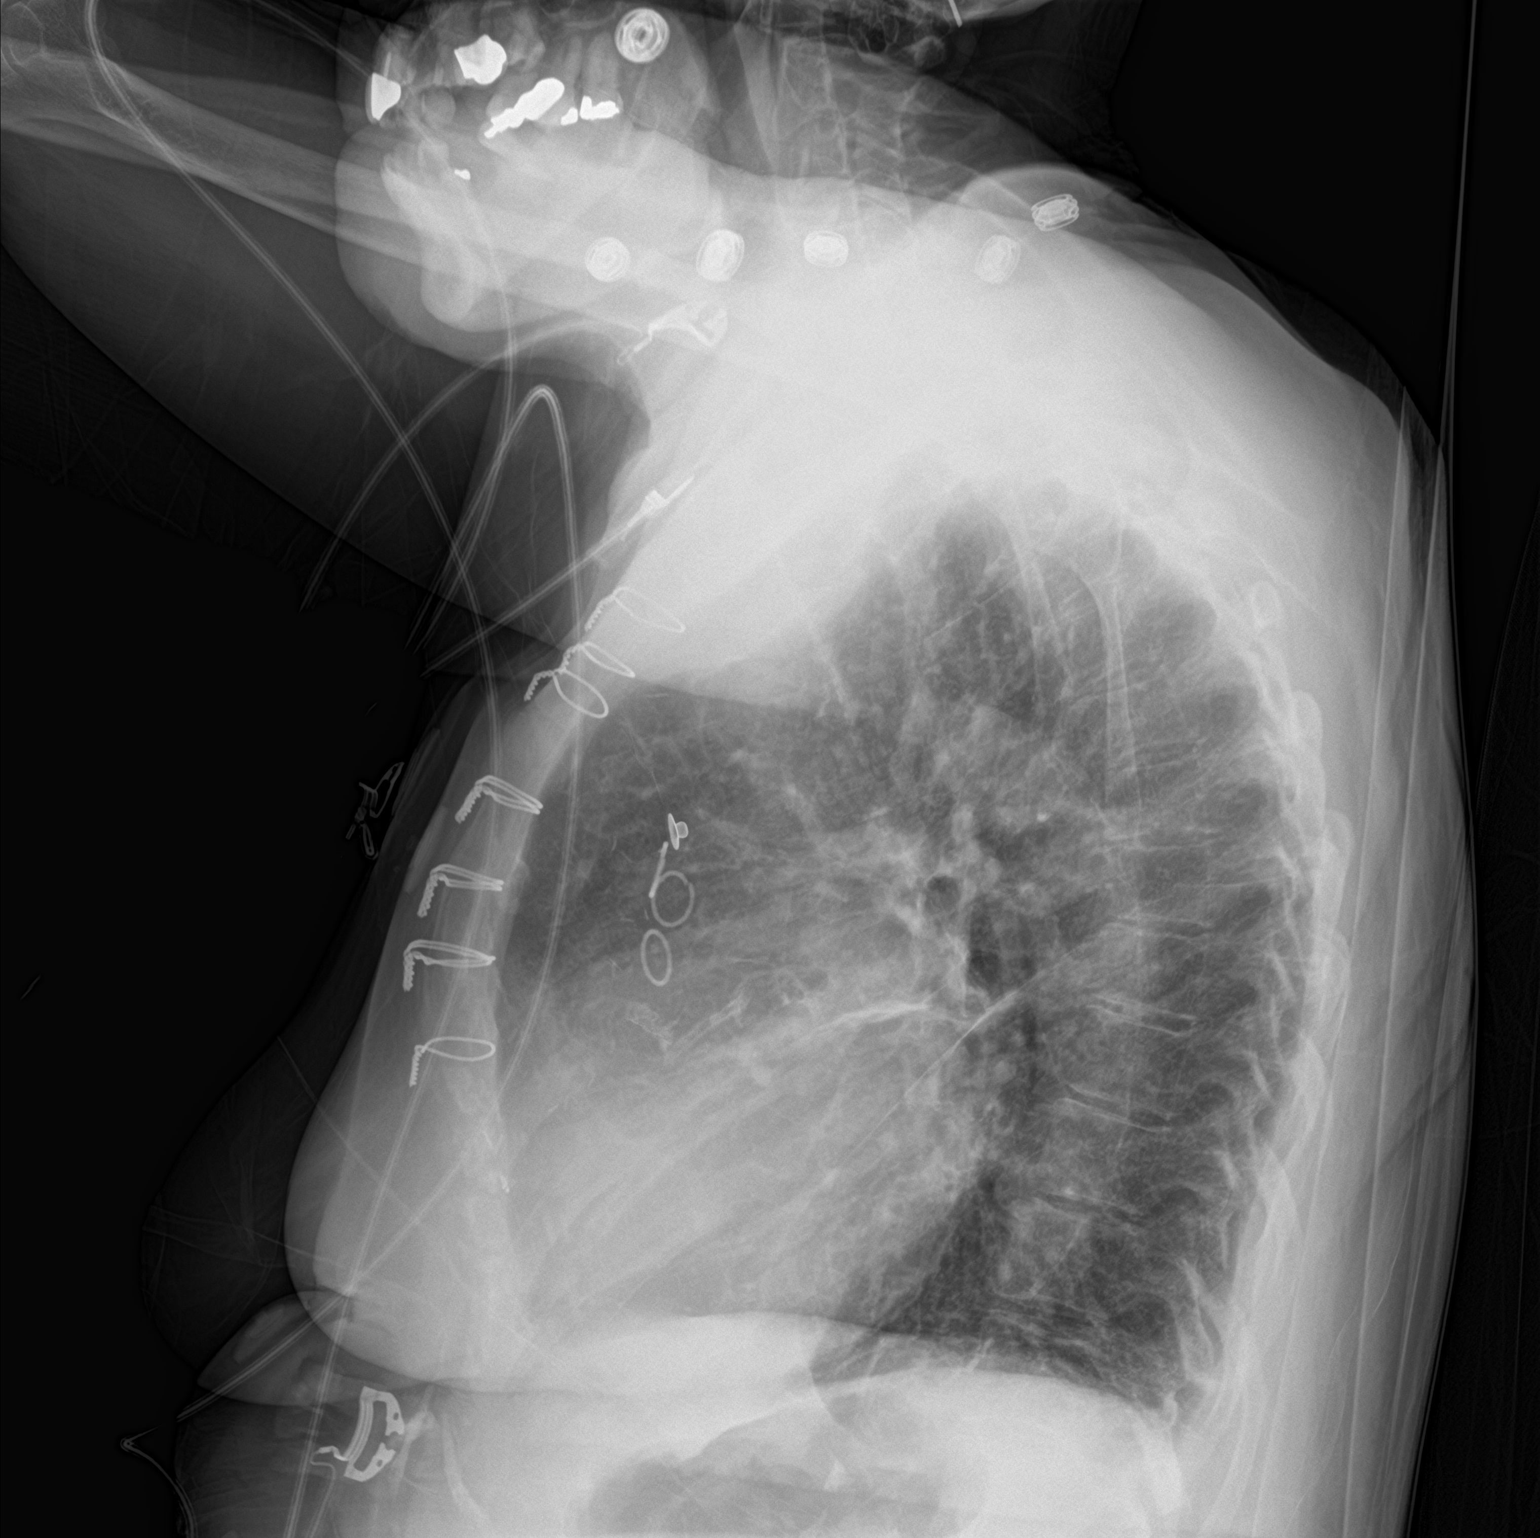

[chest ap]
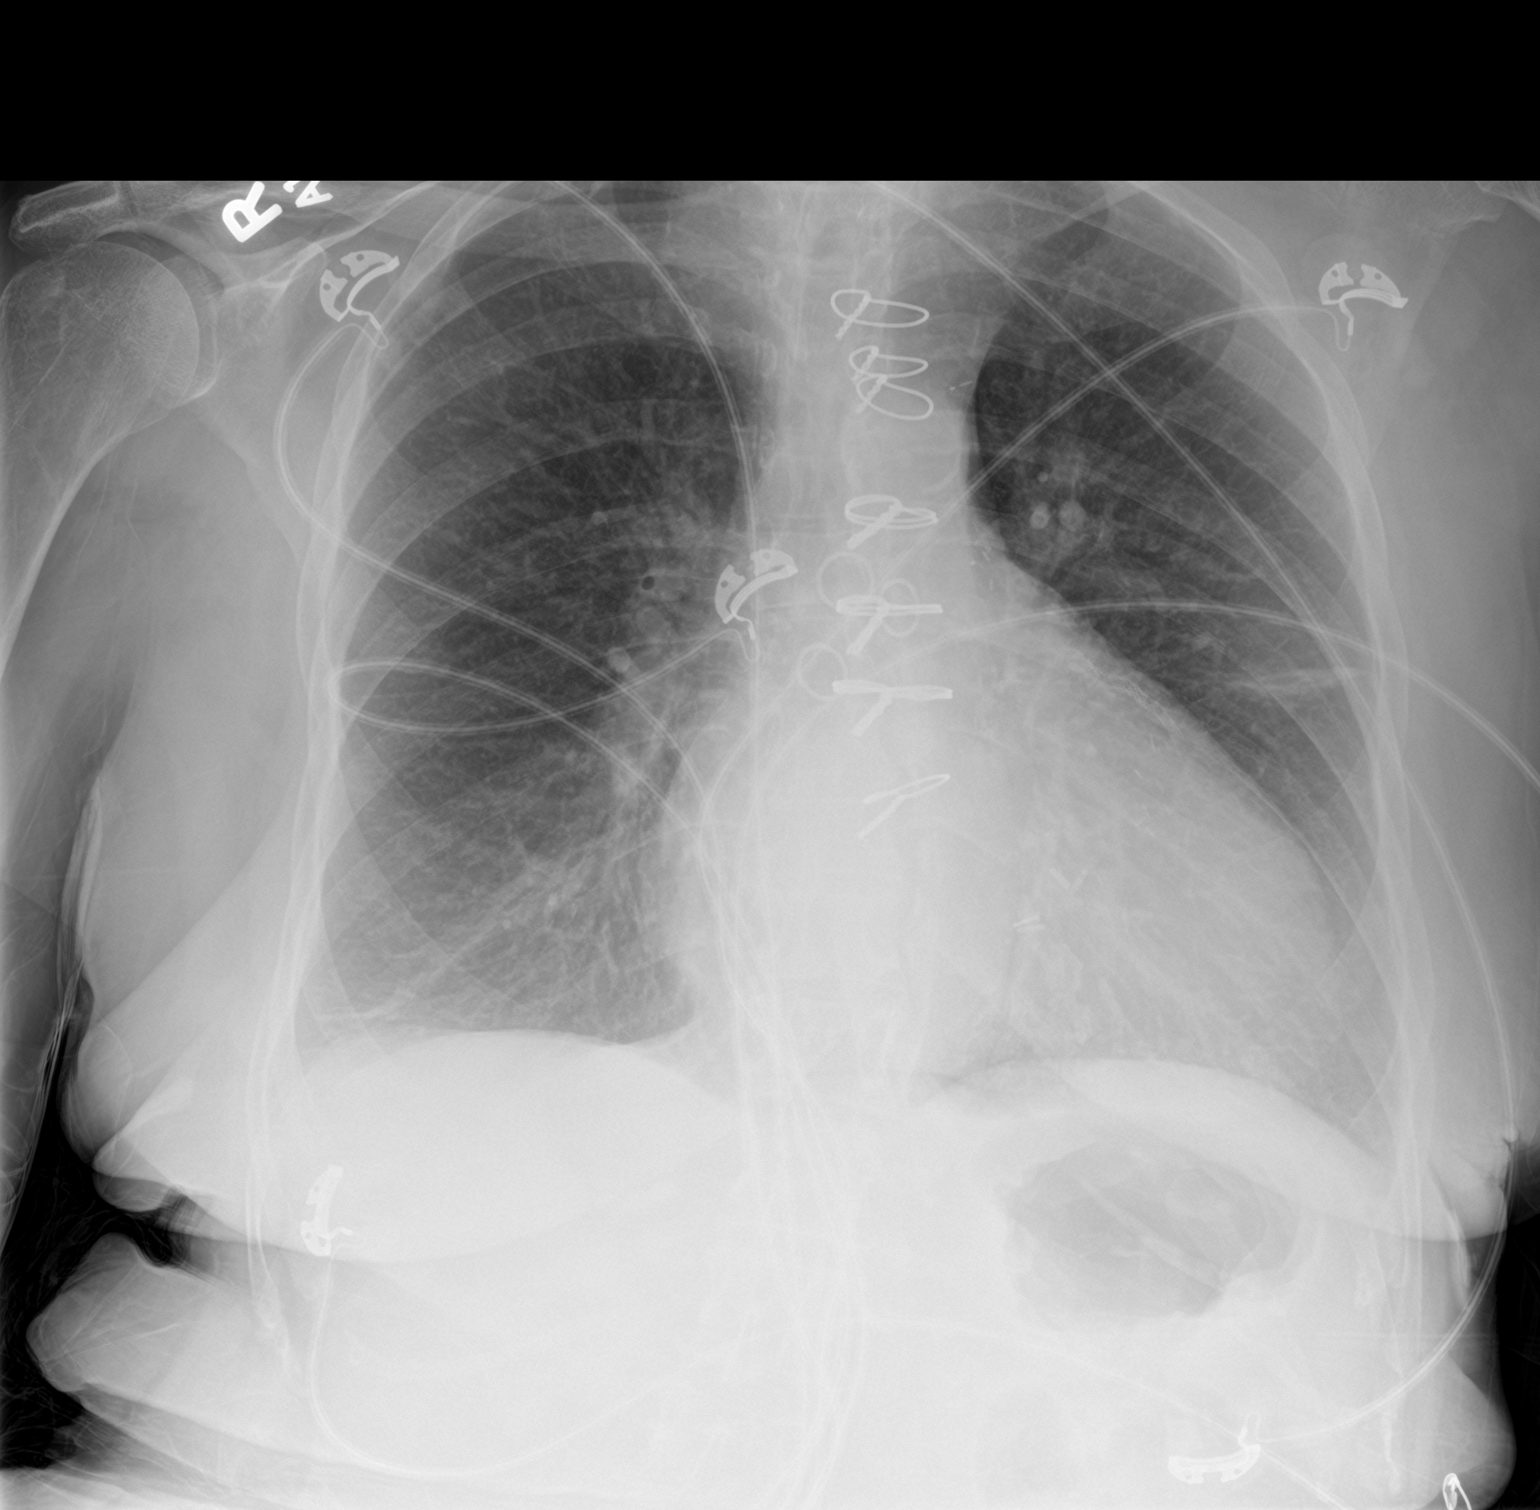

[2 of 2 positions shown; findings below may reference images not displayed]

FINDINGS: Stable postsurgical changes of CABG.

Enlarged cardiac silhouette.  Mediastinal contours appear intact.

There is no evidence of focal airspace consolidation, pleural
effusion or pneumothorax. Chronic lingular atelectasis.

Osseous structures are without acute abnormality. Soft tissues are
grossly normal.
IMPRESSION: Enlarged cardiac silhouette.

No evidence of pulmonary edema or acute airspace consolidation.

## 2017-08-13 MED ORDER — PROMETHAZINE-DM 6.25-15 MG/5ML PO SYRP: 5.0000 mL | ORAL_SOLUTION | Freq: Four times a day (QID) | ORAL | 0 refills | Status: DC | PRN

## 2017-08-13 MED ORDER — DIPHENOXYLATE-ATROPINE 2.5-0.025 MG PO TABS: 1.0000 | ORAL_TABLET | Freq: Four times a day (QID) | ORAL | 0 refills | Status: DC | PRN

## 2017-08-13 MED ORDER — ALBUTEROL SULFATE HFA 108 (90 BASE) MCG/ACT IN AERS
INHALATION_SPRAY | RESPIRATORY_TRACT | Status: AC
  Administered 2017-08-13: 1 via RESPIRATORY_TRACT
  Filled 2017-08-13: qty 6.7

## 2017-08-13 MED ORDER — ALBUTEROL SULFATE HFA 108 (90 BASE) MCG/ACT IN AERS
1.0000 | INHALATION_SPRAY | Freq: Four times a day (QID) | RESPIRATORY_TRACT | Status: DC | PRN
  Administered 2017-08-13: 1 via RESPIRATORY_TRACT

## 2017-08-13 NOTE — ED Provider Notes (Signed)
[facility] EMERGENCY DEPARTMENT Provider Note   CSN: [id number] Arrival date & time: 08/13/17  0911     History   Chief Complaint Chief Complaint  Patient presents with  . Cough  . Diarrhea  . Chest Pain    HPI Shawna [name] is a 77 y.o. female with PMH ESRD on HD, CAD, and chronic diastolic heart failure presenting with cough.  Patient states she has had mildly productive cough for 1 week duration. States she coughs up yellow tinged phlegm. She complains of abdominal pain due to the frequency and severity of her cough. She states it is also associated with rhinorrhea and sore throat. She was seen by a primary care provider recently who prescribed her Tessalon pearls, which the patient states did not improve her symptoms. She denies body aches, chills, but states she did have an episode of "getting hot" last night. She denies CP, but starting this morning has been experience shortness of breath on exertion. She states she attends HD MWF, and has not missed any sessions. Most recent HD was on Friday. She denies sick contacts. She also endorses 2 days of non-bloody diarrhea. She states she had 3 episodes of watery bowel movement yesterday, as well as 3 episodes this morning prior to arrival to the ED. She has no smoking history or underlying lung disease.        Past Medical History:  Diagnosis Date  . Acute on chronic respiratory failure with hypoxia ([place]) 10/10/2016  . Anxiety   . Arthritis   . AVM (arteriovenous malformation) of colon   . CAD (coronary artery disease)    a. 12/2011 NSTEMI/Cath/PCI LCX (2.25x14 Resolute DES) & D1 (2.25x22 Resolute DES);  b. 01/2012 Cath/PCI: LM 30, LAD 30p, 40-32m, D1 stent ok, 99 in sm branch of diag, LCX patent stent, OM1 20, RCA 95 ost (4.0x12 Promus DES), EF 55%;  c. 04/2012 Lexi Cardiolite  EF 48%, small area of scar @ base/mid inflat wall with mild peri-infarct ischemia.; CABG 12/4  . Carotid artery disease ([place])    a. 14-78%  LICA, 08/9560   . Chronic bronchitis ([place])   . Chronic diastolic CHF (congestive heart failure) ([place])    a. 02/2012 Echo EF 60-65%, nl wall motion, Gr 1 DD, mod MR  . Colon cancer ([place]) 1992  . Esophageal stricture   . ESRD on hemodialysis ([place])    ESRD due to HTN, started dialysis 2011 and gets HD [facility] with Dr Hinda Lenis on MWF schedule.  Access is LUA AVF as of Sept 2014.   Marland Kitchen GERD (gastroesophageal reflux disease)   . High cholesterol 12/2011  . History of blood transfusion 07/2011; 12/2011; 01/2012 X 2; 04/2012  . History of gout   . History of lower GI bleeding   . Hypertension   . Iron deficiency anemia   . Mitral regurgitation    a. Moderate by echo, 02/2012  . Myocardial infarction ([place])   . Ovarian cancer ([place]) 1992  . Pneumonia ~ 2009  . PUD (peptic ulcer disease)   . TIA (transient ischemic attack)     Patient Active Problem List   Diagnosis Date Noted  . Hand steal syndrome ([place]) 08/01/2017  . Anemia 07/14/2017  . Coronary artery disease of bypass graft of native heart with stable angina pectoris ([place]) 06/05/2017  . Mesenteric ischemia ([place])   . Diverticulitis   . SBO (small bowel obstruction) ([place]) 03/22/2017  . Enteritis   . Complication of vascular  access for dialysis 03/19/2017  . Preoperative clearance 01/25/2017  . Symptomatic anemia 10/24/2016  . H/O non-ST elevation myocardial infarction (NSTEMI) 10/24/2016  . Fluid overload 10/10/2016  . Complication from renal dialysis device 10/10/2016  . Non-ST elevation MI (NSTEMI) ([place])   . Encounter for fitting and adjustment of vascular catheter   . End stage renal disease ([place])   . ESRD (end stage renal disease) ([place])   . Heme positive stool   . Demand ischemia ([place]) 07/27/2016  . Hypertensive emergency 07/08/2016  . Acute on chronic respiratory failure with hypoxia ([place])   . Cardiac arrest ([place])   . Palliative care encounter   . Goals of care, counseling/discussion   . Hypertensive crisis without  congestive heart failure 05/09/2016  . Acute pulmonary edema ([place]) 04/06/2016  . Acute respiratory failure ([place]) 04/06/2016  . Hypertensive crisis 01/27/2016  . History of colon cancer 01/27/2016  . History of ovarian cancer 01/27/2016  . Hypertensive urgency 01/27/2016  . Narrow complex tachycardia ([place]) 09/08/2015  . SVT (supraventricular tachycardia) ([place]) 09/08/2015  . Influenza A 08/30/2015  . Acute on chronic diastolic CHF (congestive heart failure) ([place]) 05/04/2015  . Unstable angina ([place]) 05/03/2015  . Essential hypertension   . Pain in joint, lower leg 08/14/2014  . Chest pain 11/26/2013  . Small bowel obstruction, partial ([place]) 05/29/2013  . Chronic diastolic CHF (congestive heart failure) ([place]) 03/22/2013  . GI bleeding 03/21/2013  . Acute blood loss anemia 03/21/2013  . Vaginal odor 03/12/2013  . Vaginal discharge 03/12/2013  . Occlusion and stenosis of carotid artery without mention of cerebral infarction 01/24/2013  . Hx of CABG 07/05/2012  . Carotid artery disease ([place]) 07/05/2012  . Anemia of chronic kidney failure 07/03/2012  . Secondary hyperparathyroidism ([place]) 07/03/2012  . Mitral regurgitation 06/12/2012  . Pneumonia 06/09/2012  . Non-STEMI (non-ST elevated myocardial infarction) ([place]) 06/08/2012  . AVM (arteriovenous malformation) of small bowel, acquired ([place]) 01/20/2012  . GERD (gastroesophageal reflux disease) 01/09/2012  . HLD (hyperlipidemia) 01/05/2012  . Atherosclerosis of native coronary artery of native heart without angina pectoris 12/16/2011  . ESRD on hemodialysis ([place]) 12/16/2011    Past Surgical History:  Procedure Laterality Date  . ABDOMINAL HYSTERECTOMY  1992  . APPENDECTOMY  06/1990  . AV FISTULA PLACEMENT  07/2009   left upper arm  . AV FISTULA PLACEMENT Right 09/06/2016   Procedure: RIGHT FOREARM ARTERIOVENOUS (AV) GRAFT;  Surgeon: Elam Dutch, MD;  Location: [facility] OR;  Service: Vascular;  Laterality: Right;  . AV FISTULA PLACEMENT  N/A 02/24/2017   Procedure: INSERTION OF ARTERIOVENOUS (AV) GORE-TEX GRAFT ARM (BRACHIAL AXILLARY);  Surgeon: Katha Cabal, MD;  Location: [facility] ORS;  Service: Vascular;  Laterality: N/A;  . [place] Right 09/06/2016   Procedure: REMOVAL OF Right Arm ARTERIOVENOUS GORETEX GRAFT and Vein Patch angioplasty of brachial artery;  Surgeon: Angelia Mould, MD;  Location: [facility];  Service: Vascular;  Laterality: Right;  . COLON RESECTION  1992  . COLON SURGERY    . CORONARY ANGIOPLASTY WITH STENT PLACEMENT  12/15/11   "2"  . CORONARY ANGIOPLASTY WITH STENT PLACEMENT  y/2013   "1; makes total of 3" (05/02/2012)  . CORONARY ARTERY BYPASS GRAFT  06/13/2012   Procedure: CORONARY ARTERY BYPASS GRAFTING (CABG);  Surgeon: Grace Isaac, MD;  Location: [facility];  Service: Open Heart Surgery;  Laterality: N/A;  cabg x four;  using left internal mammary artery, and left leg greater saphenous vein harvested endoscopically  . CORONARY  STENT INTERVENTION N/A 10/13/2016   Procedure: Coronary Stent Intervention;  Surgeon: Troy Sine, MD;  Location: [facility] CV LAB;  Service: Cardiovascular;  Laterality: N/A;  . DIALYSIS/PERMA CATHETER REMOVAL N/A 04/18/2017   Procedure: DIALYSIS/PERMA CATHETER REMOVAL;  Surgeon: Katha Cabal, MD;  Location: [place] [facility] CV LAB;  Service: Cardiovascular;  Laterality: N/A;  . DILATION AND CURETTAGE OF UTERUS    . ESOPHAGOGASTRODUODENOSCOPY  01/20/2012   Procedure: ESOPHAGOGASTRODUODENOSCOPY (EGD);  Surgeon: Ladene Artist, MD,FACG;  Location[facility] ENDOSCOPY;  Service: Endoscopy;  Laterality: N/A;  . ESOPHAGOGASTRODUODENOSCOPY N/A 03/26/2013   Procedure: ESOPHAGOGASTRODUODENOSCOPY (EGD);  Surgeon: Irene Shipper, MD;  Location: [facility] ENDOSCOPY;  Service: Endoscopy;  Laterality: N/A;  . ESOPHAGOGASTRODUODENOSCOPY N/A 04/30/2015   Procedure: ESOPHAGOGASTRODUODENOSCOPY (EGD);  Surgeon: Rogene Houston, MD;  [facility]O SU[facility];  Service: Endoscopy;  Laterality: N/A;  1pm  - moved to 10/20 @ 1:10  . ESOPHAGOGASTRODUODENOSCOPY N/A 07/29/2016   Procedure: ESOPHAGOGASTRODUODENOSCOPY (EGD);  Surgeon: Manus Gunning, MD;  Location: [facility];  Service: Gastroenterology;  Laterality: N/A;  enteroscopy  . INTRAOPERATIVE TRANSESOPHAGEAL ECHOCARDIOGRAM  06/13/2012   Procedure: INTRAOPERATIVE TRANSESOPHAGEAL ECHOCARDIOGRAM;  Surgeon: Grace Isaac, MD;  Location: [facility];  Service: Open Heart Surgery;  Laterality: N/A;  . IR GENERIC HISTORICAL  07/26/2016   IR FLUORO GUIDE CV LINE RIGHT 07/26/2016 Greggory Keen, MD [facility]-INTERV RAD  . IR GENERIC HISTORICAL  07/26/2016   IR US GUIDE VASC ACCESS RIGHT 07/26/2016 Greggory Keen, MD [facility]  . IR GENERIC HISTORICAL  08/02/2016   IR US GUIDE VASC ACCESS RIGHT 08/02/2016 Greggory Keen, MD [facility]  . IR GENERIC HISTORICAL  08/02/2016   IR FLUORO GUIDE CV LINE RIGHT 08/02/2016 Greggory Keen, MD [facility]  . IR RADIOLOGY PERIPHERAL GUIDED IV START  03/28/2017  . IR US GUIDE VASC ACCESS RIGHT  03/28/2017  . LEFT HEART CATH AND CORONARY ANGIOGRAPHY N/A 09/20/2016   Procedure: Left Heart Cath and Coronary Angiography;  Surgeon: [name], MD;  Location: [place] CV LAB;  Service: Cardiovascular;  Laterality: N/A;  . LEFT HEART CATH AND CORS/GRAFTS ANGIOGRAPHY N/A 10/13/2016   Procedure: Left Heart Cath and Cors/Grafts Angiography;  Surgeon: Troy Sine, MD;  Location: [place] CV LAB;  Service: Cardiovascular;  Laterality: N/A;  . LEFT HEART CATHETERIZATION WITH CORONARY ANGIOGRAM N/A 12/15/2011   Procedure: LEFT HEART CATHETERIZATION WITH CORONARY ANGIOGRAM;  Surgeon: Burnell Blanks, MD;  Location: [facility] CATH LAB;  Service: Cardiovascular;  Laterality: N/A;  . LEFT HEART CATHETERIZATION WITH CORONARY ANGIOGRAM N/A 01/10/2012   Procedure: LEFT HEART CATHETERIZATION WITH CORONARY ANGIOGRAM;  Surgeon: Peter M Martinique, MD;  Location: [facility] CATH LAB;  Service: Cardiovascular;  Laterality: N/A;  . LEFT HEART  CATHETERIZATION WITH CORONARY ANGIOGRAM N/A 06/08/2012   Procedure: LEFT HEART CATHETERIZATION WITH CORONARY ANGIOGRAM;  Surgeon: Burnell Blanks, MD;  Location: [facility] CATH LAB;  Service: Cardiovascular;  Laterality: N/A;  . LEFT HEART CATHETERIZATION WITH CORONARY/GRAFT ANGIOGRAM N/A 12/10/2013   Procedure: LEFT HEART CATHETERIZATION WITH Beatrix Fetters;  Surgeon: [name], MD;  Location: [facility] CATH LAB;  Service: Cardiovascular;  Laterality: N/A;  . OVARY SURGERY     ovarian cancer  . REVISION OF ARTERIOVENOUS GORETEX GRAFT N/A 02/24/2017   Procedure: REVISION OF ARTERIOVENOUS GORETEX GRAFT (RESECTION);  Surgeon: Katha Cabal, MD;  Location: [facility] ORS;  Service: Vascular;  Laterality: N/A;  Earney Mallet N/A 10/15/2013   Procedure: Fistulogram;  Surgeon: Serafina Mitchell, MD;  Location: [facility]  CATH LAB;  Service: Cardiovascular;  Laterality: N/A;  . THROMBECTOMY / ARTERIOVENOUS GRAFT REVISION  2011   left upper arm  . TUBAL LIGATION  1980's  . UPPER EXTREMITY ANGIOGRAPHY Bilateral 12/06/2016   Procedure: Upper Extremity Angiography;  Surgeon: Katha Cabal, MD;  Location: [place] CV LAB;  Service: Cardiovascular;  Laterality: Bilateral;  . UPPER EXTREMITY INTERVENTION Left 06/06/2017   Procedure: UPPER EXTREMITY INTERVENTION;  Surgeon: Katha Cabal, MD;  Location: [place] CV LAB;  Service: Cardiovascular;  Laterality: Left;    OB History    Gravida Para Term Preterm AB Living   2 2   2   2    SAB TAB Ectopic Multiple Live Births                   Home Medications    Prior to Admission medications   Medication Sig Start Date End Date Taking? Authorizing Provider  ALPRAZolam (XANAX) 0.25 MG tablet Take 1 tablet (0.25 mg total) by mouth 2 (two) times daily as needed for anxiety or sleep. 04/05/17   Arrien, Jimmy Picket, MD  amiodarone (PACERONE) 200 MG tablet Take 1 tablet (200 mg total) by mouth daily. 12/08/16   Herminio Commons, MD    carvedilol (COREG) 6.25 MG tablet TAKE 1 TABLET BY MOUTH TWICE DAILY WITH  A  MEAL Patient taking differently: TAKE 1 TABLET (6.25 MG) BY MOUTH TWICE DAILY WITH  A  MEAL 06/15/17   Herminio Commons, MD  cinacalcet (SENSIPAR) 30 MG tablet Take 30 mg by mouth daily after supper.    [provider]  cloNIDine (CATAPRES) 0.2 MG tablet Take 0.2 mg by mouth daily as needed (high blood pressure).     [provider]  diphenhydrAMINE (BENADRYL) 25 mg capsule Take 50 mg by mouth daily as needed for itching or allergies.     [provider]  epoetin alfa (EPOGEN,PROCRIT) 40981 UNIT/ML injection Inject 1 mL (10,000 Units total) into the vein every Monday, Wednesday, and Friday with hemodialysis. 04/11/16   Orvan Falconer, MD  ferrous sulfate 325 (65 FE) MG EC tablet Take 1 tablet (325 mg total) by mouth 2 (two) times daily with a meal. 07/15/17 08/14/17  Johnson, Clanford L, MD  fluticasone (FLONASE) 50 MCG/ACT nasal spray Place 1 spray at bedtime as needed into both nostrils for allergies.     [provider]  hydrALAZINE (APRESOLINE) 50 MG tablet Take 50 mg by mouth 2 (two) times daily as needed (high blood pressure).    [provider]  isosorbide mononitrate (IMDUR) 120 MG 24 hr tablet Take 1 tablet (120 mg total) by mouth daily. 05/11/17   Herminio Commons, MD  lidocaine-prilocaine (EMLA) cream Apply 1 application topically every Monday, Wednesday, and Friday. Prior to dialysis    [provider]  multivitamin (RENA-VIT) TABS tablet Take 1 tablet by mouth daily.    [provider]  nitroGLYCERIN (NITROSTAT) 0.4 MG SL tablet Place 1 tablet (0.4 mg total) under the tongue every 5 (five) minutes as needed for chest pain. 06/05/17   Herminio Commons, MD  nystatin-triamcinolone (MYCOLOG II) cream Apply 1 application topically 2 (two) times daily. Patient taking differently: Apply 1 application topically 2 (two) times daily as needed (itching/rash).   05/30/17   Rehman, Mechele Dawley, MD  omeprazole (PRILOSEC) 20 MG capsule Take 20 mg by mouth daily.    [provider]  ondansetron (ZOFRAN-ODT) 4 MG disintegrating tablet Take 4 mg by mouth  every 8 (eight) hours as needed for nausea or vomiting.  07/12/17   [provider]  promethazine-dextromethorphan (PROMETHAZINE-DM) 6.25-15 MG/5ML syrup Take 5 mLs by mouth 4 (four) times daily as needed for cough. 08/13/17   Melanee Spry, MD  sevelamer carbonate (RENVELA) 800 MG tablet Take 800-2,400 mg by mouth See admin instructions. Take 3 tablets (2400) by mouth twice daily with meals, take 1 tablet (800 mg) with snacks    [provider]  simvastatin (ZOCOR) 20 MG tablet TAKE ONE TABLET BY MOUTH AT BEDTIME Patient taking differently: TAKE ONE TABLET (20 MG) BY MOUTH DAILY AT BEDTIME 02/13/17   Herminio Commons, MD  ticagrelor (BRILINTA) 90 MG TABS tablet Take 90 mg by mouth 2 (two) times daily.    [provider]    Family History Family History  Problem Relation Age of Onset  . Heart disease Mother        Heart Disease before age 19  . Hyperlipidemia Mother   . Hypertension Mother   . Diabetes Mother   . Heart attack Mother   . Heart disease Father        Heart Disease before age 43  . Hyperlipidemia Father   . Hypertension Father   . Diabetes Father   . Diabetes Sister   . Hypertension Sister   . Diabetes Brother   . Hyperlipidemia Brother   . Heart attack Brother   . Hypertension Sister   . Heart attack Brother   . Other Unknown        noncontributory for early CAD  . Colon cancer Neg Hx   . Esophageal cancer Neg Hx   . Liver disease Neg Hx   . Kidney disease Neg Hx   . Colon polyps Neg Hx     Social History Social History   Tobacco Use  . Smoking status: Never Smoker  . Smokeless tobacco: Never Used  Substance Use Topics  . Alcohol use: No    Alcohol/week: 0.0 oz  . Drug use: No     Allergies   Aspirin; Penicillins; Amlodipine;  Bactrim [sulfamethoxazole-trimethoprim]; Contrast media [iodinated diagnostic agents]; Iron; Nitrofurantoin; Tylenol [acetaminophen]; Gabapentin; Dexilant [dexlansoprazole]; Levaquin [levofloxacin in d5w]; Morphine and related; Plavix [clopidogrel bisulfate]; Protonix [pantoprazole sodium]; and Venofer [ferric oxide]   Review of Systems Review of Systems   Physical Exam Updated Vital Signs BP 121/61   Pulse 66   Temp 98.5 F (36.9 C) (Oral)   Resp 14   SpO2 100%   Physical Exam   ED Treatments / Results  Labs (all labs ordered are listed, but only abnormal results are displayed) Labs Reviewed  CBC - Abnormal; Notable for the following components:      Result Value   RBC 3.69 (*)    Hemoglobin 11.5 (*)    MCV 100.8 (*)    RDW 19.5 (*)    All other components within normal limits  COMPREHENSIVE METABOLIC PANEL    EKG  EKG Interpretation None       Radiology Dg Chest 2 View  Result Date: 08/13/2017 CLINICAL DATA:  Cough for 3-4 days. EXAM: CHEST  2 VIEW COMPARISON:  07/14/2017 FINDINGS: Stable postsurgical changes of CABG. Enlarged cardiac silhouette.  Mediastinal contours appear intact. There is no evidence of focal airspace consolidation, pleural effusion or pneumothorax. Chronic lingular atelectasis. Osseous structures are without acute abnormality. Soft tissues are grossly normal. IMPRESSION: Enlarged cardiac silhouette. No evidence of pulmonary edema or acute airspace consolidation. Electronically Signed  By: Fidela Salisbury M.D.   On: 08/13/2017 10:43    Procedures Procedures (including critical care time)  Medications Ordered in ED Medications - No data to display   Initial Impression / Assessment and Plan / ED Course  I have reviewed the triage vital signs and the nursing notes.  Pertinent labs & imaging results that were available during my care of the patient were reviewed by me and considered in my medical decision making (see chart for  details).   77 yo female presenting with cough for 1 week duration and several episodes of non-bloody diarrhea. Patient is afebrile, saturating well on RA, WBC within normal limits, patient afebrile at 98.2. With patient's associated upper respiratory symptoms I feel this is most likely a viral bronchitis that will improve with supportive care. Lung examination without wheezing and CTAB.  I do not think her symptoms are consistent with influenza and CXR is negative for focal opacity or other findings concerning for pneumonia. Will discharge with albuterol inhaler and promethazine-DM for symptom management.   Patient's diarrhea likely associated with viral illness. No red flags symptoms associated with her diarrhea.  Will treat with one time dose of lomotil prior to d/c.   Final Clinical Impressions(s) / ED Diagnoses   Final diagnoses:  Viral bronchitis    ED Discharge Orders        Ordered    promethazine-dextromethorphan (PROMETHAZINE-DM) 6.25-15 MG/5ML syrup  4 times daily PRN     08/13/17 1135       Melanee Spry, MD 08/13/17 1156    Tanna Furry, MD 08/14/17 2153

## 2017-08-13 NOTE — ED Triage Notes (Addendum)
To ED for eval of cough for past 3-4 days. Pt appears in nad- laughing and joking with triage nurse. Denies known fevers. States she feels like she has nasal drainage that makes her feel a little nauseated at times. No nausea now. Seen at pcp on Friday and given tessalon pearls- pt states 'they aren't worth 2 cents'. States her chest is 'tight. Not as loose as it was'. Pt states she did have diarrhea last pm. Pt is a dialysis pt

## 2017-08-13 NOTE — Discharge Instructions (Addendum)
Shawna [name],   I believe your symptoms are due to viral bronchitis. This will likely improve within a few weeks. Your chest xray did not show any signs of pneumonia. I have prescribed you some cough syrup that will help suppress your cough. I have also prescribed you an albuterol inhaler. Take 1-2 puffs every 6 hours as needed for wheezing or shortness of breath. The cough syrup is called Promethazine-Dm. I have sent this prescription to your pharmacy. I also think that your diarrhea is related to this viral illness. If your symptoms get worse please call your primary care doctor or return to the emergency department.

## 2017-08-13 NOTE — ED Provider Notes (Signed)
Patient seen and evaluated.  Discussed with resident.  Patient has cough.  Clear lungs.  Reports some wheezing at home.  No pneumonia clinically or radiographically.  Not dehydrated.  Has diarrhea but no blood.  No significant abdominal pain and no tenderness.  Agree with MDI, Phenergan DM, continuing Tessalon, as needed Lomotil.  Recheck as needed.   Tanna Furry, MD 08/13/17 1151

## 2017-08-21 DIAGNOSIS — N186 End stage renal disease: Secondary | ICD-10-CM | POA: Diagnosis not present

## 2017-08-21 DIAGNOSIS — Z992 Dependence on renal dialysis: Secondary | ICD-10-CM | POA: Diagnosis not present

## 2017-08-22 DIAGNOSIS — E039 Hypothyroidism, unspecified: Secondary | ICD-10-CM | POA: Diagnosis not present

## 2017-08-22 DIAGNOSIS — R739 Hyperglycemia, unspecified: Secondary | ICD-10-CM | POA: Diagnosis not present

## 2017-08-22 DIAGNOSIS — N19 Unspecified kidney failure: Secondary | ICD-10-CM | POA: Diagnosis not present

## 2017-08-22 DIAGNOSIS — Z6825 Body mass index (BMI) 25.0-25.9, adult: Secondary | ICD-10-CM | POA: Diagnosis not present

## 2017-08-23 DIAGNOSIS — N186 End stage renal disease: Secondary | ICD-10-CM | POA: Diagnosis not present

## 2017-08-23 DIAGNOSIS — Z992 Dependence on renal dialysis: Secondary | ICD-10-CM | POA: Diagnosis not present

## 2017-08-24 DIAGNOSIS — J189 Pneumonia, unspecified organism: Secondary | ICD-10-CM | POA: Diagnosis not present

## 2017-08-24 DIAGNOSIS — D631 Anemia in chronic kidney disease: Secondary | ICD-10-CM | POA: Diagnosis not present

## 2017-08-24 DIAGNOSIS — Z79891 Long term (current) use of opiate analgesic: Secondary | ICD-10-CM | POA: Diagnosis not present

## 2017-08-24 DIAGNOSIS — Z792 Long term (current) use of antibiotics: Secondary | ICD-10-CM | POA: Diagnosis not present

## 2017-08-24 DIAGNOSIS — N186 End stage renal disease: Secondary | ICD-10-CM | POA: Diagnosis not present

## 2017-08-24 DIAGNOSIS — I4891 Unspecified atrial fibrillation: Secondary | ICD-10-CM | POA: Diagnosis not present

## 2017-08-24 DIAGNOSIS — I132 Hypertensive heart and chronic kidney disease with heart failure and with stage 5 chronic kidney disease, or end stage renal disease: Secondary | ICD-10-CM | POA: Diagnosis not present

## 2017-08-24 DIAGNOSIS — I5032 Chronic diastolic (congestive) heart failure: Secondary | ICD-10-CM | POA: Diagnosis not present

## 2017-08-25 DIAGNOSIS — Z992 Dependence on renal dialysis: Secondary | ICD-10-CM | POA: Diagnosis not present

## 2017-08-25 DIAGNOSIS — N186 End stage renal disease: Secondary | ICD-10-CM | POA: Diagnosis not present

## 2017-08-28 DIAGNOSIS — Z992 Dependence on renal dialysis: Secondary | ICD-10-CM | POA: Diagnosis not present

## 2017-08-28 DIAGNOSIS — N186 End stage renal disease: Secondary | ICD-10-CM | POA: Diagnosis not present

## 2017-08-29 DIAGNOSIS — Z79891 Long term (current) use of opiate analgesic: Secondary | ICD-10-CM | POA: Diagnosis not present

## 2017-08-29 DIAGNOSIS — I5032 Chronic diastolic (congestive) heart failure: Secondary | ICD-10-CM | POA: Diagnosis not present

## 2017-08-29 DIAGNOSIS — N186 End stage renal disease: Secondary | ICD-10-CM | POA: Diagnosis not present

## 2017-08-29 DIAGNOSIS — Z992 Dependence on renal dialysis: Secondary | ICD-10-CM | POA: Diagnosis not present

## 2017-08-29 DIAGNOSIS — D631 Anemia in chronic kidney disease: Secondary | ICD-10-CM | POA: Diagnosis not present

## 2017-08-29 DIAGNOSIS — I132 Hypertensive heart and chronic kidney disease with heart failure and with stage 5 chronic kidney disease, or end stage renal disease: Secondary | ICD-10-CM | POA: Diagnosis not present

## 2017-08-29 DIAGNOSIS — I4891 Unspecified atrial fibrillation: Secondary | ICD-10-CM | POA: Diagnosis not present

## 2017-08-29 DIAGNOSIS — Z792 Long term (current) use of antibiotics: Secondary | ICD-10-CM | POA: Diagnosis not present

## 2017-08-29 DIAGNOSIS — J189 Pneumonia, unspecified organism: Secondary | ICD-10-CM | POA: Diagnosis not present

## 2017-08-30 DIAGNOSIS — N186 End stage renal disease: Secondary | ICD-10-CM | POA: Diagnosis not present

## 2017-08-30 DIAGNOSIS — Z992 Dependence on renal dialysis: Secondary | ICD-10-CM | POA: Diagnosis not present

## 2017-08-31 ENCOUNTER — Other Ambulatory Visit (INDEPENDENT_AMBULATORY_CARE_PROVIDER_SITE_OTHER): Payer: Self-pay | Admitting: *Deleted

## 2017-08-31 ENCOUNTER — Telehealth (INDEPENDENT_AMBULATORY_CARE_PROVIDER_SITE_OTHER): Payer: Self-pay | Admitting: *Deleted

## 2017-08-31 DIAGNOSIS — I4891 Unspecified atrial fibrillation: Secondary | ICD-10-CM | POA: Diagnosis not present

## 2017-08-31 DIAGNOSIS — I132 Hypertensive heart and chronic kidney disease with heart failure and with stage 5 chronic kidney disease, or end stage renal disease: Secondary | ICD-10-CM | POA: Diagnosis not present

## 2017-08-31 DIAGNOSIS — Z792 Long term (current) use of antibiotics: Secondary | ICD-10-CM | POA: Diagnosis not present

## 2017-08-31 DIAGNOSIS — D631 Anemia in chronic kidney disease: Secondary | ICD-10-CM | POA: Diagnosis not present

## 2017-08-31 DIAGNOSIS — Z79891 Long term (current) use of opiate analgesic: Secondary | ICD-10-CM | POA: Diagnosis not present

## 2017-08-31 DIAGNOSIS — J189 Pneumonia, unspecified organism: Secondary | ICD-10-CM | POA: Diagnosis not present

## 2017-08-31 DIAGNOSIS — N186 End stage renal disease: Secondary | ICD-10-CM | POA: Diagnosis not present

## 2017-08-31 DIAGNOSIS — R195 Other fecal abnormalities: Secondary | ICD-10-CM

## 2017-08-31 DIAGNOSIS — I5032 Chronic diastolic (congestive) heart failure: Secondary | ICD-10-CM | POA: Diagnosis not present

## 2017-08-31 NOTE — Telephone Encounter (Signed)
CBC noted for 4 weeks , a letter will be sent as a reminder.

## 2017-08-31 NOTE — Telephone Encounter (Signed)
Results given to patient. Stool today was brown. She had one positive stool card.  Tammy  Will recheck a CBC in 4 weeks.

## 2017-08-31 NOTE — Telephone Encounter (Signed)
   Diagnosis:    Result(s)   Card 1:Negative: 07/25/2017    Card 1&2 was black in color    Card 2:Negative:07/29/2017   Card 3: Positive - this was taken from a sample that the patient included in the return of her samples. Black in color    Completed by: Thomas Hoff , LPN   HEMOCCULT SENSA DEVELOPER: LOT#: [id number] EXPIRATION DATE:  2021-11  HEMOCCULT SENSA CARD:  LOT#: [id number]  EXPIRATION DATE: 05/21   CARD CONTROL RESULTS:  POSITIVE: Positive NEGATIVE: Negative    ADDITIONAL COMMENTS: Forwarded to Raeanne Gathers

## 2017-09-01 DIAGNOSIS — N186 End stage renal disease: Secondary | ICD-10-CM | POA: Diagnosis not present

## 2017-09-01 DIAGNOSIS — Z992 Dependence on renal dialysis: Secondary | ICD-10-CM | POA: Diagnosis not present

## 2017-09-03 DIAGNOSIS — I160 Hypertensive urgency: Secondary | ICD-10-CM | POA: Diagnosis not present

## 2017-09-03 DIAGNOSIS — I447 Left bundle-branch block, unspecified: Secondary | ICD-10-CM | POA: Diagnosis not present

## 2017-09-03 DIAGNOSIS — J40 Bronchitis, not specified as acute or chronic: Secondary | ICD-10-CM | POA: Diagnosis not present

## 2017-09-03 DIAGNOSIS — R0602 Shortness of breath: Secondary | ICD-10-CM | POA: Diagnosis not present

## 2017-09-03 DIAGNOSIS — R0600 Dyspnea, unspecified: Secondary | ICD-10-CM | POA: Diagnosis not present

## 2017-09-03 DIAGNOSIS — M1990 Unspecified osteoarthritis, unspecified site: Secondary | ICD-10-CM | POA: Diagnosis not present

## 2017-09-03 DIAGNOSIS — Z8701 Personal history of pneumonia (recurrent): Secondary | ICD-10-CM | POA: Diagnosis not present

## 2017-09-03 DIAGNOSIS — I132 Hypertensive heart and chronic kidney disease with heart failure and with stage 5 chronic kidney disease, or end stage renal disease: Secondary | ICD-10-CM | POA: Diagnosis not present

## 2017-09-03 DIAGNOSIS — Z992 Dependence on renal dialysis: Secondary | ICD-10-CM | POA: Diagnosis not present

## 2017-09-03 DIAGNOSIS — I5032 Chronic diastolic (congestive) heart failure: Secondary | ICD-10-CM | POA: Diagnosis not present

## 2017-09-03 DIAGNOSIS — I2510 Atherosclerotic heart disease of native coronary artery without angina pectoris: Secondary | ICD-10-CM | POA: Diagnosis not present

## 2017-09-03 DIAGNOSIS — R1110 Vomiting, unspecified: Secondary | ICD-10-CM | POA: Diagnosis not present

## 2017-09-03 DIAGNOSIS — N186 End stage renal disease: Secondary | ICD-10-CM | POA: Diagnosis not present

## 2017-09-04 DIAGNOSIS — R69 Illness, unspecified: Secondary | ICD-10-CM | POA: Diagnosis not present

## 2017-09-04 DIAGNOSIS — Z992 Dependence on renal dialysis: Secondary | ICD-10-CM | POA: Diagnosis not present

## 2017-09-04 DIAGNOSIS — I132 Hypertensive heart and chronic kidney disease with heart failure and with stage 5 chronic kidney disease, or end stage renal disease: Secondary | ICD-10-CM | POA: Diagnosis not present

## 2017-09-04 DIAGNOSIS — I2510 Atherosclerotic heart disease of native coronary artery without angina pectoris: Secondary | ICD-10-CM | POA: Diagnosis not present

## 2017-09-04 DIAGNOSIS — J40 Bronchitis, not specified as acute or chronic: Secondary | ICD-10-CM | POA: Diagnosis not present

## 2017-09-04 DIAGNOSIS — N186 End stage renal disease: Secondary | ICD-10-CM | POA: Diagnosis not present

## 2017-09-05 DIAGNOSIS — Z792 Long term (current) use of antibiotics: Secondary | ICD-10-CM | POA: Diagnosis not present

## 2017-09-05 DIAGNOSIS — J189 Pneumonia, unspecified organism: Secondary | ICD-10-CM | POA: Diagnosis not present

## 2017-09-05 DIAGNOSIS — I132 Hypertensive heart and chronic kidney disease with heart failure and with stage 5 chronic kidney disease, or end stage renal disease: Secondary | ICD-10-CM | POA: Diagnosis not present

## 2017-09-05 DIAGNOSIS — I5032 Chronic diastolic (congestive) heart failure: Secondary | ICD-10-CM | POA: Diagnosis not present

## 2017-09-05 DIAGNOSIS — Z79891 Long term (current) use of opiate analgesic: Secondary | ICD-10-CM | POA: Diagnosis not present

## 2017-09-05 DIAGNOSIS — I4891 Unspecified atrial fibrillation: Secondary | ICD-10-CM | POA: Diagnosis not present

## 2017-09-05 DIAGNOSIS — D631 Anemia in chronic kidney disease: Secondary | ICD-10-CM | POA: Diagnosis not present

## 2017-09-05 DIAGNOSIS — N186 End stage renal disease: Secondary | ICD-10-CM | POA: Diagnosis not present

## 2017-09-05 DIAGNOSIS — J40 Bronchitis, not specified as acute or chronic: Secondary | ICD-10-CM | POA: Diagnosis not present

## 2017-09-06 DIAGNOSIS — Z992 Dependence on renal dialysis: Secondary | ICD-10-CM | POA: Diagnosis not present

## 2017-09-06 DIAGNOSIS — N186 End stage renal disease: Secondary | ICD-10-CM | POA: Diagnosis not present

## 2017-09-07 ENCOUNTER — Other Ambulatory Visit (INDEPENDENT_AMBULATORY_CARE_PROVIDER_SITE_OTHER): Payer: Self-pay | Admitting: *Deleted

## 2017-09-07 ENCOUNTER — Encounter (INDEPENDENT_AMBULATORY_CARE_PROVIDER_SITE_OTHER): Payer: Self-pay | Admitting: *Deleted

## 2017-09-07 DIAGNOSIS — E213 Hyperparathyroidism, unspecified: Secondary | ICD-10-CM | POA: Diagnosis not present

## 2017-09-07 DIAGNOSIS — Z792 Long term (current) use of antibiotics: Secondary | ICD-10-CM | POA: Diagnosis not present

## 2017-09-07 DIAGNOSIS — I130 Hypertensive heart and chronic kidney disease with heart failure and stage 1 through stage 4 chronic kidney disease, or unspecified chronic kidney disease: Secondary | ICD-10-CM | POA: Diagnosis not present

## 2017-09-07 DIAGNOSIS — I25119 Atherosclerotic heart disease of native coronary artery with unspecified angina pectoris: Secondary | ICD-10-CM | POA: Diagnosis not present

## 2017-09-07 DIAGNOSIS — J42 Unspecified chronic bronchitis: Secondary | ICD-10-CM | POA: Diagnosis not present

## 2017-09-07 DIAGNOSIS — Z992 Dependence on renal dialysis: Secondary | ICD-10-CM | POA: Diagnosis not present

## 2017-09-07 DIAGNOSIS — J189 Pneumonia, unspecified organism: Secondary | ICD-10-CM | POA: Diagnosis not present

## 2017-09-07 DIAGNOSIS — J9610 Chronic respiratory failure, unspecified whether with hypoxia or hypercapnia: Secondary | ICD-10-CM | POA: Diagnosis not present

## 2017-09-07 DIAGNOSIS — I5032 Chronic diastolic (congestive) heart failure: Secondary | ICD-10-CM | POA: Diagnosis not present

## 2017-09-07 DIAGNOSIS — I509 Heart failure, unspecified: Secondary | ICD-10-CM | POA: Diagnosis not present

## 2017-09-07 DIAGNOSIS — N186 End stage renal disease: Secondary | ICD-10-CM | POA: Diagnosis not present

## 2017-09-07 DIAGNOSIS — Z79891 Long term (current) use of opiate analgesic: Secondary | ICD-10-CM | POA: Diagnosis not present

## 2017-09-07 DIAGNOSIS — R195 Other fecal abnormalities: Secondary | ICD-10-CM

## 2017-09-07 DIAGNOSIS — I4891 Unspecified atrial fibrillation: Secondary | ICD-10-CM | POA: Diagnosis not present

## 2017-09-07 DIAGNOSIS — I132 Hypertensive heart and chronic kidney disease with heart failure and with stage 5 chronic kidney disease, or end stage renal disease: Secondary | ICD-10-CM | POA: Diagnosis not present

## 2017-09-07 DIAGNOSIS — D631 Anemia in chronic kidney disease: Secondary | ICD-10-CM | POA: Diagnosis not present

## 2017-09-07 DIAGNOSIS — I739 Peripheral vascular disease, unspecified: Secondary | ICD-10-CM | POA: Diagnosis not present

## 2017-09-08 DIAGNOSIS — Z992 Dependence on renal dialysis: Secondary | ICD-10-CM | POA: Diagnosis not present

## 2017-09-08 DIAGNOSIS — N186 End stage renal disease: Secondary | ICD-10-CM | POA: Diagnosis not present

## 2017-09-12 ENCOUNTER — Telehealth: Payer: Self-pay | Admitting: Cardiovascular Disease

## 2017-09-12 NOTE — Telephone Encounter (Signed)
Patient called stating that she continues to have swelling in her feet and legs.  Has appointment on 09/18/2017   Please all [phone].

## 2017-09-12 NOTE — Telephone Encounter (Signed)
This will have to be addressed by nephrology, as fluid removal is accomplished via dialysis and they may need to take additional fluid off at next session.

## 2017-09-12 NOTE — Telephone Encounter (Signed)
LMTCB

## 2017-09-12 NOTE — Telephone Encounter (Signed)
Patient has had an increase in swelling in legs and ankles. Patient has noticed about a 2 pound weight gain. Patient has not had any abnormal shortness of breath (still on the mend from bronchitis). Patient does have an apt on 3/11. Patient states she is keeping feet and legs elevated as much as possible. Patient is on dialysis.

## 2017-09-13 ENCOUNTER — Telehealth (INDEPENDENT_AMBULATORY_CARE_PROVIDER_SITE_OTHER): Payer: Self-pay | Admitting: *Deleted

## 2017-09-13 ENCOUNTER — Telehealth (INDEPENDENT_AMBULATORY_CARE_PROVIDER_SITE_OTHER): Payer: Self-pay | Admitting: Internal Medicine

## 2017-09-13 NOTE — Telephone Encounter (Signed)
Please let Dr. Laural Golden know Shawna [name]'s stools are black. She has had multiple EGDs in the past.   Thanks

## 2017-09-13 NOTE — Telephone Encounter (Signed)
Per Karna Christmas Setzer,NP-C let Dr.Rehman .

## 2017-09-13 NOTE — Telephone Encounter (Signed)
Please let Dr. Laural Golden be aware

## 2017-09-13 NOTE — Telephone Encounter (Signed)
   Diagnosis:    Result(s)   Card 1: Positive  Stool was black and clay -like in appearance.           Completed by: Thomas Hoff , LPN   HEMOCCULT SENSA DEVELOPER: LOT#: [id number]  EXPIRATION DATE: 2021-11   HEMOCCULT SENSA CARD:  LOT#: [id number]  EXPIRATION DATE: 05/21   CARD CONTROL RESULTS:  POSITIVE: Positive  NEGATIVE: Negative    ADDITIONAL COMMENTS: Forwarded to Raeanne Gathers for recommendation. The patient has NOT been called.

## 2017-09-13 NOTE — Telephone Encounter (Signed)
Patient notified and verbalized understanding. 

## 2017-09-14 ENCOUNTER — Ambulatory Visit (INDEPENDENT_AMBULATORY_CARE_PROVIDER_SITE_OTHER): Payer: Medicare HMO | Admitting: Vascular Surgery

## 2017-09-14 ENCOUNTER — Encounter (INDEPENDENT_AMBULATORY_CARE_PROVIDER_SITE_OTHER): Payer: Medicare HMO

## 2017-09-14 DIAGNOSIS — I5032 Chronic diastolic (congestive) heart failure: Secondary | ICD-10-CM | POA: Diagnosis not present

## 2017-09-14 DIAGNOSIS — D631 Anemia in chronic kidney disease: Secondary | ICD-10-CM | POA: Diagnosis not present

## 2017-09-14 DIAGNOSIS — N186 End stage renal disease: Secondary | ICD-10-CM | POA: Diagnosis not present

## 2017-09-14 DIAGNOSIS — J189 Pneumonia, unspecified organism: Secondary | ICD-10-CM | POA: Diagnosis not present

## 2017-09-14 DIAGNOSIS — Z79891 Long term (current) use of opiate analgesic: Secondary | ICD-10-CM | POA: Diagnosis not present

## 2017-09-14 DIAGNOSIS — I4891 Unspecified atrial fibrillation: Secondary | ICD-10-CM | POA: Diagnosis not present

## 2017-09-14 DIAGNOSIS — I132 Hypertensive heart and chronic kidney disease with heart failure and with stage 5 chronic kidney disease, or end stage renal disease: Secondary | ICD-10-CM | POA: Diagnosis not present

## 2017-09-14 DIAGNOSIS — Z792 Long term (current) use of antibiotics: Secondary | ICD-10-CM | POA: Diagnosis not present

## 2017-09-14 NOTE — Telephone Encounter (Signed)
She needs CBC and Hemoccult x 1. Possibly has small bowel ischemic injury high risk for any intervention.

## 2017-09-15 ENCOUNTER — Other Ambulatory Visit (INDEPENDENT_AMBULATORY_CARE_PROVIDER_SITE_OTHER): Payer: Self-pay | Admitting: *Deleted

## 2017-09-15 ENCOUNTER — Encounter: Payer: Self-pay | Admitting: *Deleted

## 2017-09-15 DIAGNOSIS — K921 Melena: Secondary | ICD-10-CM

## 2017-09-15 NOTE — Telephone Encounter (Signed)
CBC has been ordered and the patient will pick up the hemoccult card at the time her blood is drawn.

## 2017-09-15 NOTE — Progress Notes (Signed)
cbc

## 2017-09-16 DIAGNOSIS — D631 Anemia in chronic kidney disease: Secondary | ICD-10-CM | POA: Diagnosis not present

## 2017-09-16 DIAGNOSIS — J189 Pneumonia, unspecified organism: Secondary | ICD-10-CM | POA: Diagnosis not present

## 2017-09-16 DIAGNOSIS — I4891 Unspecified atrial fibrillation: Secondary | ICD-10-CM | POA: Diagnosis not present

## 2017-09-16 DIAGNOSIS — I132 Hypertensive heart and chronic kidney disease with heart failure and with stage 5 chronic kidney disease, or end stage renal disease: Secondary | ICD-10-CM | POA: Diagnosis not present

## 2017-09-16 DIAGNOSIS — N186 End stage renal disease: Secondary | ICD-10-CM | POA: Diagnosis not present

## 2017-09-16 DIAGNOSIS — I5032 Chronic diastolic (congestive) heart failure: Secondary | ICD-10-CM | POA: Diagnosis not present

## 2017-09-16 DIAGNOSIS — Z792 Long term (current) use of antibiotics: Secondary | ICD-10-CM | POA: Diagnosis not present

## 2017-09-16 DIAGNOSIS — Z79891 Long term (current) use of opiate analgesic: Secondary | ICD-10-CM | POA: Diagnosis not present

## 2017-09-18 ENCOUNTER — Ambulatory Visit: Payer: Medicare HMO | Admitting: Cardiovascular Disease

## 2017-09-18 ENCOUNTER — Encounter: Payer: Self-pay | Admitting: Cardiovascular Disease

## 2017-09-18 ENCOUNTER — Other Ambulatory Visit: Payer: Self-pay

## 2017-09-18 VITALS — BP 183/64 | HR 58 | Ht 61.0 in | Wt 129.0 lb

## 2017-09-18 DIAGNOSIS — N186 End stage renal disease: Secondary | ICD-10-CM | POA: Diagnosis not present

## 2017-09-18 DIAGNOSIS — I10 Essential (primary) hypertension: Secondary | ICD-10-CM

## 2017-09-18 DIAGNOSIS — I472 Ventricular tachycardia, unspecified: Secondary | ICD-10-CM

## 2017-09-18 DIAGNOSIS — I6523 Occlusion and stenosis of bilateral carotid arteries: Secondary | ICD-10-CM

## 2017-09-18 DIAGNOSIS — I5032 Chronic diastolic (congestive) heart failure: Secondary | ICD-10-CM | POA: Diagnosis not present

## 2017-09-18 DIAGNOSIS — I25708 Atherosclerosis of coronary artery bypass graft(s), unspecified, with other forms of angina pectoris: Secondary | ICD-10-CM

## 2017-09-18 DIAGNOSIS — Z79899 Other long term (current) drug therapy: Secondary | ICD-10-CM | POA: Diagnosis not present

## 2017-09-18 DIAGNOSIS — E7800 Pure hypercholesterolemia, unspecified: Secondary | ICD-10-CM | POA: Diagnosis not present

## 2017-09-18 NOTE — Telephone Encounter (Signed)
Stool is guaiac positive and CBC is pending.

## 2017-09-18 NOTE — Progress Notes (Signed)
SUBJECTIVE: The patient presents for routine follow-up.  She was hospitalized for acute blood loss anemia in January 2019.  She was given 2 unit packed red blood cell transfusion during hemodialysis.  She was hospitalized in September 2018 for small bowel enteritis with mesenteric ischemia and intra-abdominal abscesses.  Her coronary disease remained stable at that time.  She was hospitalized for hypertensive urgency in August 2018.  She sustained a non-STEMI in April 2018 and underwent percutaneous coronary intervention to a subtotally occluded diagonal vessel with a drug-eluting stent.She had a patent LIMA graft to the LAD and a patent SVG supplying the ramus intermedius and patent SVG supplying the distal RCA.  Echocardiogram on 10/12/16 showed normal left ventricular systolic function and regional wall motion, LVEF 56-21%, grade 1 diastolic dysfunction, elevated filling pressures.  She recently finished dialysis.  Systolic blood pressure was in the 200 range there.  She took clonidine 0.2 mg a little over an hour ago.  She denies chest pain.  She said she did not sleep well last night.  Oxygen saturations are 96%.  AST and ALT were normal on 07/14/17.    Review of Systems: As per "subjective", otherwise negative.  Allergies  Allergen Reactions  . Aspirin Other (See Comments)    High Doses Mess up her stomach; "makes my bowels have blood in them". Takes 81 mg EC Aspirin   . Penicillins Other (See Comments)    SYNCOPE? , "makes me real weak when I take it; like I'll pass out"  Has patient had a PCN reaction causing immediate rash, facial/tongue/throat swelling, SOB or lightheadedness with hypotension: Yes Has patient had a PCN reaction causing severe rash involving mucus membranes or skin necrosis: no Has patient had a PCN reaction that required hospitalization no Has patient had a PCN reaction occurring within the last 10 years: no If all of the above  . Amlodipine  Swelling  . Bactrim [Sulfamethoxazole-Trimethoprim] Rash  . Contrast Media [Iodinated Diagnostic Agents] Itching  . Iron Itching and Other (See Comments)    "they gave me iron in dialysis; had to give me Benadryl cause I had to have the iron" (05/02/2012)  . Nitrofurantoin Hives  . Tylenol [Acetaminophen] Itching and Other (See Comments)    Makes her feet on fire per pt  . Gabapentin Other (See Comments)    Unknown reaction  . Dexilant [Dexlansoprazole] Other (See Comments)    Upset stomach  . Levaquin [Levofloxacin In D5w] Rash  . Morphine And Related Itching and Other (See Comments)    Itching in feet  . Plavix [Clopidogrel Bisulfate] Rash  . Protonix [Pantoprazole Sodium] Rash  . Venofer [Ferric Oxide] Itching and Other (See Comments)    Patient reports using Benadryl prior to doses as [facility]    Current Outpatient Medications  Medication Sig Dispense Refill  . ALPRAZolam (XANAX) 0.25 MG tablet Take 1 tablet (0.25 mg total) by mouth 2 (two) times daily as needed for anxiety or sleep. 10 tablet 0  . amiodarone (PACERONE) 200 MG tablet Take 1 tablet (200 mg total) by mouth daily. 30 tablet 3  . carvedilol (COREG) 6.25 MG tablet TAKE 1 TABLET BY MOUTH TWICE DAILY WITH  A  MEAL (Patient taking differently: TAKE 1 TABLET (6.25 MG) BY MOUTH TWICE DAILY WITH  A  MEAL) 180 tablet 3  . cinacalcet (SENSIPAR) 30 MG tablet Take 30 mg by mouth daily after supper.    . cloNIDine (CATAPRES) 0.2 MG tablet Take  0.2 mg by mouth daily as needed (high blood pressure).     . diphenhydrAMINE (BENADRYL) 25 mg capsule Take 50 mg by mouth daily as needed for itching or allergies.     . diphenoxylate-atropine (LOMOTIL) 2.5-0.025 MG tablet Take 1 tablet by mouth 4 (four) times daily as needed for diarrhea or loose stools. 30 tablet 0  . epoetin alfa (EPOGEN,PROCRIT) 96222 UNIT/ML injection Inject 1 mL (10,000 Units total) into the vein every Monday, Wednesday, and Friday with hemodialysis. 1 mL 10  .  fluticasone (FLONASE) 50 MCG/ACT nasal spray Place 1 spray at bedtime as needed into both nostrils for allergies.     . hydrALAZINE (APRESOLINE) 50 MG tablet Take 50 mg by mouth 2 (two) times daily as needed (high blood pressure).    . isosorbide mononitrate (IMDUR) 120 MG 24 hr tablet Take 1 tablet (120 mg total) by mouth daily. 30 tablet 6  . lidocaine-prilocaine (EMLA) cream Apply 1 application topically every Monday, Wednesday, and Friday. Prior to dialysis    . multivitamin (RENA-VIT) TABS tablet Take 1 tablet by mouth daily.    . nitroGLYCERIN (NITROSTAT) 0.4 MG SL tablet Place 1 tablet (0.4 mg total) under the tongue every 5 (five) minutes as needed for chest pain. 25 tablet 3  . nystatin-triamcinolone (MYCOLOG II) cream Apply 1 application topically 2 (two) times daily. (Patient taking differently: Apply 1 application topically 2 (two) times daily as needed (itching/rash). ) 30 g 1  . omeprazole (PRILOSEC) 20 MG capsule Take 20 mg by mouth daily.    . ondansetron (ZOFRAN-ODT) 4 MG disintegrating tablet Take 4 mg by mouth every 8 (eight) hours as needed for nausea or vomiting.     . sevelamer carbonate (RENVELA) 800 MG tablet Take 800-2,400 mg by mouth See admin instructions. Take 3 tablets (2400) by mouth twice daily with meals, take 1 tablet (800 mg) with snacks    . simvastatin (ZOCOR) 20 MG tablet TAKE ONE TABLET BY MOUTH AT BEDTIME (Patient taking differently: TAKE ONE TABLET (20 MG) BY MOUTH DAILY AT BEDTIME) 90 tablet 3  . ticagrelor (BRILINTA) 90 MG TABS tablet Take 90 mg by mouth 2 (two) times daily.    . ferrous sulfate 325 (65 FE) MG EC tablet Take 1 tablet (325 mg total) by mouth 2 (two) times daily with a meal. 60 tablet 0   No current facility-administered medications for this visit.     Past Medical History:  Diagnosis Date  . Acute on chronic respiratory failure with hypoxia ([place]) 10/10/2016  . Anxiety   . Arthritis   . AVM (arteriovenous malformation) of colon   . CAD  (coronary artery disease)    a. 12/2011 NSTEMI/Cath/PCI LCX (2.25x14 Resolute DES) & D1 (2.25x22 Resolute DES);  b. 01/2012 Cath/PCI: LM 30, LAD 30p, 40-32m, D1 stent ok, 99 in sm branch of diag, LCX patent stent, OM1 20, RCA 95 ost (4.0x12 Promus DES), EF 55%;  c. 04/2012 Lexi Cardiolite  EF 48%, small area of scar @ base/mid inflat wall with mild peri-infarct ischemia.; CABG 12/4  . Carotid artery disease ([place])    a. 97-98% LICA, 03/2118   . Chronic bronchitis ([place])   . Chronic diastolic CHF (congestive heart failure) ([place])    a. 02/2012 Echo EF 60-65%, nl wall motion, Gr 1 DD, mod MR  . Colon cancer ([place]) 1992  . Esophageal stricture   . ESRD on hemodialysis ([place])    ESRD due to HTN, started dialysis 2011 and gets HD  at [facility] with Dr Hinda Lenis on MWF schedule.  Access is LUA AVF as of Sept 2014.   Marland Kitchen GERD (gastroesophageal reflux disease)   . High cholesterol 12/2011  . History of blood transfusion 07/2011; 12/2011; 01/2012 X 2; 04/2012  . History of gout   . History of lower GI bleeding   . Hypertension   . Iron deficiency anemia   . Mitral regurgitation    a. Moderate by echo, 02/2012  . Myocardial infarction ([place])   . Ovarian cancer ([place]) 1992  . Pneumonia ~ 2009  . PUD (peptic ulcer disease)   . TIA (transient ischemic attack)     Past Surgical History:  Procedure Laterality Date  . ABDOMINAL HYSTERECTOMY  1992  . APPENDECTOMY  06/1990  . AV FISTULA PLACEMENT  07/2009   left upper arm  . AV FISTULA PLACEMENT Right 09/06/2016   Procedure: RIGHT FOREARM ARTERIOVENOUS (AV) GRAFT;  Surgeon: Elam Dutch, MD;  Location: [facility] OR;  Service: Vascular;  Laterality: Right;  . AV FISTULA PLACEMENT N/A 02/24/2017   Procedure: INSERTION OF ARTERIOVENOUS (AV) GORE-TEX GRAFT ARM (BRACHIAL AXILLARY);  Surgeon: Katha Cabal, MD;  Location: [facility] ORS;  Service: Vascular;  Laterality: N/A;  . [place] Right 09/06/2016   Procedure: REMOVAL OF Right Arm ARTERIOVENOUS GORETEX GRAFT and Vein  Patch angioplasty of brachial artery;  Surgeon: Angelia Mould, MD;  Location: [place];  Service: Vascular;  Laterality: Right;  . COLON RESECTION  1992  . COLON SURGERY    . CORONARY ANGIOPLASTY WITH STENT PLACEMENT  12/15/11   "2"  . CORONARY ANGIOPLASTY WITH STENT PLACEMENT  y/2013   "1; makes total of 3" (05/02/2012)  . CORONARY ARTERY BYPASS GRAFT  06/13/2012   Procedure: CORONARY ARTERY BYPASS GRAFTING (CABG);  Surgeon: Grace Isaac, MD;  Location: [place];  Service: Open Heart Surgery;  Laterality: N/A;  cabg x four;  using left internal mammary artery, and left leg greater saphenous vein harvested endoscopically  . CORONARY STENT INTERVENTION N/A 10/13/2016   Procedure: Coronary Stent Intervention;  Surgeon: Troy Sine, MD;  Location: [place] CV LAB;  Service: Cardiovascular;  Laterality: N/A;  . DIALYSIS/PERMA CATHETER REMOVAL N/A 04/18/2017   Procedure: DIALYSIS/PERMA CATHETER REMOVAL;  Surgeon: Katha Cabal, MD;  Location: [place] CV LAB;  Service: Cardiovascular;  Laterality: N/A;  . DILATION AND CURETTAGE OF UTERUS    . ESOPHAGOGASTRODUODENOSCOPY  01/20/2012   Procedure: ESOPHAGOGASTRODUODENOSCOPY (EGD);  Surgeon: Ladene Artist, MD,FACG;  Location: [facility] ENDOSCOPY;  Service: Endoscopy;  Laterality: N/A;  . ESOPHAGOGASTRODUODENOSCOPY N/A 03/26/2013   Procedure: ESOPHAGOGASTRODUODENOSCOPY (EGD);  Surgeon: Irene Shipper, MD;  Location: [facility] ENDOSCOPY;  Service: Endoscopy;  Laterality: N/A;  . ESOPHAGOGASTRODUODENOSCOPY N/A 04/30/2015   Procedure: ESOPHAGOGASTRODUODENOSCOPY (EGD);  Surgeon: Rogene Houston, MD;  [facility];  Service: Endoscopy;  Laterality: N/A;  1pm - moved to 10/20 @ 1:10  . ESOPHAGOGASTRODUODENOSCOPY N/A 07/29/2016   Procedure: ESOPHAGOGASTRODUODENOSCOPY (EGD);  Surgeon: Manus Gunning, MD;  Location: [place];  Service: Gastroenterology;  Laterality: N/A;  enteroscopy  . INTRAOPERATIVE TRANSESOPHAGEAL ECHOCARDIOGRAM   06/13/2012   Procedure: INTRAOPERATIVE TRANSESOPHAGEAL ECHOCARDIOGRAM;  Surgeon: Grace Isaac, MD;  Location: [place];  Service: Open Heart Surgery;  Laterality: N/A;  . IR GENERIC HISTORICAL  07/26/2016   IR FLUORO GUIDE CV LINE RIGHT 07/26/2016 Greggory Keen, MD [facility]-INTER[facility] RAD  . IR GENERIC HISTORICAL  07/26/2016   IR US GUIDE VASC ACCESS RIGHT 07/26/2016 Greggory Keen, MD  [facility]  . IR GENERIC HISTORICAL  08/02/2016   IR US GUIDE VASC ACCESS RIGHT 08/02/2016 Greggory Keen, MD [facility]  . IR GENERIC HISTORICAL  08/02/2016   IR FLUORO GUIDE CV LINE RIGHT 08/02/2016 Greggory Keen, MD [facility]  . IR RADIOLOGY PERIPHERAL GUIDED IV START  03/28/2017  . IR US GUIDE VASC ACCESS RIGHT  03/28/2017  . LEFT HEART CATH AND CORONARY ANGIOGRAPHY N/A 09/20/2016   Procedure: Left Heart Cath and Coronary Angiography;  Surgeon: [name], MD;  Location: [place] CV LAB;  Service: Cardiovascular;  Laterality: N/A;  . LEFT HEART CATH AND CORS/GRAFTS ANGIOGRAPHY N/A 10/13/2016   Procedure: Left Heart Cath and Cors/Grafts Angiography;  Surgeon: Troy Sine, MD;  Location: [place] CV LAB;  Service: Cardiovascular;  Laterality: N/A;  . LEFT HEART CATHETERIZATION WITH CORONARY ANGIOGRAM N/A 12/15/2011   Procedure: LEFT HEART CATHETERIZATION WITH CORONARY ANGIOGRAM;  Surgeon: Burnell Blanks, MD;  Location: [facility] CATH LAB;  Service: Cardiovascular;  Laterality: N/A;  . LEFT HEART CATHETERIZATION WITH CORONARY ANGIOGRAM N/A 01/10/2012   Procedure: LEFT HEART CATHETERIZATION WITH CORONARY ANGIOGRAM;  Surgeon: Peter M Martinique, MD;  Location: [facility] CATH LAB;  Service: Cardiovascular;  Laterality: N/A;  . LEFT HEART CATHETERIZATION WITH CORONARY ANGIOGRAM N/A 06/08/2012   Procedure: LEFT HEART CATHETERIZATION WITH CORONARY ANGIOGRAM;  Surgeon: Burnell Blanks, MD;  Location: [facility] CATH LAB;  Service: Cardiovascular;  Laterality: N/A;  . LEFT HEART CATHETERIZATION WITH CORONARY/GRAFT ANGIOGRAM N/A 12/10/2013    Procedure: LEFT HEART CATHETERIZATION WITH Beatrix Fetters;  Surgeon: [name], MD;  Location: [facility] CATH LAB;  Service: Cardiovascular;  Laterality: N/A;  . OVARY SURGERY     ovarian cancer  . REVISION OF ARTERIOVENOUS GORETEX GRAFT N/A 02/24/2017   Procedure: REVISION OF ARTERIOVENOUS GORETEX GRAFT (RESECTION);  Surgeon: Katha Cabal, MD;  Location: [facility] ORS;  Service: Vascular;  Laterality: N/A;  E[place] N/A 10/15/2013   Procedure: Fistulogram;  Surgeon: Serafina Mitchell, MD;  Location: [facility] CATH LAB;  Service: Cardiovascular;  Laterality: N/A;  . THROMBECTOMY / ARTERIOVENOUS GRAFT REVISION  2011   left upper arm  . TUBAL LIGATION  1980's  . UPPER EXTREMITY ANGIOGRAPHY Bilateral 12/06/2016   Procedure: Upper Extremity Angiography;  Surgeon: Katha Cabal, MD;  Location: [place] CV LAB;  Service: Cardiovascular;  Laterality: Bilateral;  . UPPER EXTREMITY INTERVENTION Left 06/06/2017   Procedure: UPPER EXTREMITY INTERVENTION;  Surgeon: Katha Cabal, MD;  Location: [place] CV LAB;  Service: Cardiovascular;  Laterality: Left;    Social History   Socioeconomic History  . Marital status: Married    Spouse name: Not on file  . Number of children: Not on file  . Years of education: Not on file  . Highest education level: Not on file  Social Needs  . Financial resource strain: Not on file  . Food insecurity - worry: Not on file  . Food insecurity - inability: Not on file  . Transportation needs - medical: Not on file  . Transportation needs - non-medical: Not on file  Occupational History  . Not on file  Tobacco Use  . Smoking status: Never Smoker  . Smokeless tobacco: Never Used  Substance and Sexual Activity  . Alcohol use: No    Alcohol/week: 0.0 oz  . Drug use: No  . Sexual activity: Yes    Birth control/protection: Surgical  Other Topics Concern  . Not on file  Social History Narrative   Lives in  [place], [place] with husband.   Dialysis pt - [facility].     Vitals:   09/18/17 1517  BP: (!) 183/64  Pulse: (!) 58  SpO2: 96%  Weight: 129 lb (58.5 kg)  Height: 5\' 1"  (1.549 m)    Wt Readings from Last 3 Encounters:  09/18/17 129 lb (58.5 kg)  08/01/17 132 lb (59.9 kg)  07/20/17 129 lb 3.2 oz (58.6 kg)     PHYSICAL EXAM General: NAD HEENT: Normal. Neck: No JVD, no thyromegaly. Lungs: Clear to auscultation bilaterally with normal respiratory effort. CV: Regular rate and rhythm, normal S1/S2, no S3/S4, no murmur. No pretibial or periankle edema.   Abdomen: Soft, nontender, no distention.  Neurologic: Alert and oriented.  Psych: Normal affect. Skin: Normal. Musculoskeletal: No gross deformities.    ECG: Most recent ECG reviewed.   Labs: Lab Results  Component Value Date/Time   K 4.1 08/13/2017 09:54 AM   BUN 34 (H) 08/13/2017 09:54 AM   CREATININE 8.53 (H) 08/13/2017 09:54 AM   ALT 23 08/13/2017 09:54 AM   TSH 1.536 05/19/2016 11:13 AM   TSH 2.864 08/24/2014 10:00 AM   HGB 11.5 (L) 08/13/2017 09:54 AM     Lipids: Lab Results  Component Value Date/Time   LDLCALC 31 02/08/2017 12:05 PM   CHOL 106 02/08/2017 12:05 PM   TRIG 113 02/08/2017 12:05 PM   HDL 52 02/08/2017 12:05 PM       ASSESSMENT AND PLAN: 1. CAD with h/o 4-vessel CABGwith NSTEMI and PCI of diagonal in 10/2016:Symptomatically stable. Coronary angiography detailed above. Continue aspirin, Coreg, nitrates, Brilinta, and simvastatin.    I will reduce Brilinta to 60 mg twice daily in May.  2. Malignant HTN: Elevated.  She just took clonidine 0.2 mg.  Instructed her to take 50 mg of hydralazine.  3. Hyperlipidemia:Continue statin.   4. Carotid artery stenosis:Stable. Continue surveillance monitoring. Continue aspirin and statin.  6.  Paroxysmal ventricular tachycardia: Stable.  Continue Coreg and amiodarone.  Liver transaminases were normal on 07/14/17.  I will check a TSH.      Disposition: Follow up 4  months   Kate Sable, M.D., F.A.C.C.

## 2017-09-18 NOTE — Patient Instructions (Addendum)
Medication Instructions:   Decrease Brilinta to 60mg  twice a day on 11/08/2017.  Please call the office when ready for new prescription.  Continue all other medications.    Labwork:  TSH - order given today.  Office will contact with results via phone or letter.    Testing/Procedures: none  Follow-Up: 4 months   Any Other Special Instructions Will Be Listed Below (If Applicable).  If you need a refill on your cardiac medications before your next appointment, please call your pharmacy.

## 2017-09-19 ENCOUNTER — Other Ambulatory Visit: Payer: Self-pay | Admitting: Cardiovascular Disease

## 2017-09-19 ENCOUNTER — Ambulatory Visit: Payer: Medicare HMO | Admitting: Urology

## 2017-09-19 DIAGNOSIS — Z792 Long term (current) use of antibiotics: Secondary | ICD-10-CM | POA: Diagnosis not present

## 2017-09-19 DIAGNOSIS — Z79891 Long term (current) use of opiate analgesic: Secondary | ICD-10-CM | POA: Diagnosis not present

## 2017-09-19 DIAGNOSIS — D3001 Benign neoplasm of right kidney: Secondary | ICD-10-CM

## 2017-09-19 DIAGNOSIS — J189 Pneumonia, unspecified organism: Secondary | ICD-10-CM | POA: Diagnosis not present

## 2017-09-19 DIAGNOSIS — I5032 Chronic diastolic (congestive) heart failure: Secondary | ICD-10-CM | POA: Diagnosis not present

## 2017-09-19 DIAGNOSIS — N186 End stage renal disease: Secondary | ICD-10-CM | POA: Diagnosis not present

## 2017-09-19 DIAGNOSIS — I4891 Unspecified atrial fibrillation: Secondary | ICD-10-CM | POA: Diagnosis not present

## 2017-09-19 DIAGNOSIS — I132 Hypertensive heart and chronic kidney disease with heart failure and with stage 5 chronic kidney disease, or end stage renal disease: Secondary | ICD-10-CM | POA: Diagnosis not present

## 2017-09-19 DIAGNOSIS — D631 Anemia in chronic kidney disease: Secondary | ICD-10-CM | POA: Diagnosis not present

## 2017-09-20 NOTE — Telephone Encounter (Signed)
Patient was called and a message was left to remind her to have the CBC drawn. Her hemoccult card was positive for blood. I have also called,(message left) [facility] to check with them , to see if they have drawn a cbc this week and if so fax [place] those results.

## 2017-09-21 DIAGNOSIS — J189 Pneumonia, unspecified organism: Secondary | ICD-10-CM | POA: Diagnosis not present

## 2017-09-21 DIAGNOSIS — Z792 Long term (current) use of antibiotics: Secondary | ICD-10-CM | POA: Diagnosis not present

## 2017-09-21 DIAGNOSIS — I5032 Chronic diastolic (congestive) heart failure: Secondary | ICD-10-CM | POA: Diagnosis not present

## 2017-09-21 DIAGNOSIS — I132 Hypertensive heart and chronic kidney disease with heart failure and with stage 5 chronic kidney disease, or end stage renal disease: Secondary | ICD-10-CM | POA: Diagnosis not present

## 2017-09-21 DIAGNOSIS — D631 Anemia in chronic kidney disease: Secondary | ICD-10-CM | POA: Diagnosis not present

## 2017-09-21 DIAGNOSIS — N186 End stage renal disease: Secondary | ICD-10-CM | POA: Diagnosis not present

## 2017-09-21 DIAGNOSIS — Z79891 Long term (current) use of opiate analgesic: Secondary | ICD-10-CM | POA: Diagnosis not present

## 2017-09-21 DIAGNOSIS — I4891 Unspecified atrial fibrillation: Secondary | ICD-10-CM | POA: Diagnosis not present

## 2017-09-22 DIAGNOSIS — K921 Melena: Secondary | ICD-10-CM | POA: Diagnosis not present

## 2017-09-23 LAB — CBC
HCT: 36.3 % (ref 35.0–45.0)
Hemoglobin: 11.8 g/dL (ref 11.7–15.5)
MCH: 32.2 pg (ref 27.0–33.0)
MCHC: 32.5 g/dL (ref 32.0–36.0)
MCV: 99.2 fL (ref 80.0–100.0)
MPV: 13.8 fL — ABNORMAL HIGH (ref 7.5–12.5)
PLATELETS: 197 10*3/uL (ref 140–400)
RBC: 3.66 10*6/uL — AB (ref 3.80–5.10)
RDW: 18.1 % — AB (ref 11.0–15.0)
WBC: 6 10*3/uL (ref 3.8–10.8)

## 2017-09-26 DIAGNOSIS — Z792 Long term (current) use of antibiotics: Secondary | ICD-10-CM | POA: Diagnosis not present

## 2017-09-26 DIAGNOSIS — Z79891 Long term (current) use of opiate analgesic: Secondary | ICD-10-CM | POA: Diagnosis not present

## 2017-09-26 DIAGNOSIS — D631 Anemia in chronic kidney disease: Secondary | ICD-10-CM | POA: Diagnosis not present

## 2017-09-26 DIAGNOSIS — I132 Hypertensive heart and chronic kidney disease with heart failure and with stage 5 chronic kidney disease, or end stage renal disease: Secondary | ICD-10-CM | POA: Diagnosis not present

## 2017-09-26 DIAGNOSIS — I5032 Chronic diastolic (congestive) heart failure: Secondary | ICD-10-CM | POA: Diagnosis not present

## 2017-09-26 DIAGNOSIS — J189 Pneumonia, unspecified organism: Secondary | ICD-10-CM | POA: Diagnosis not present

## 2017-09-26 DIAGNOSIS — I4891 Unspecified atrial fibrillation: Secondary | ICD-10-CM | POA: Diagnosis not present

## 2017-09-26 DIAGNOSIS — N186 End stage renal disease: Secondary | ICD-10-CM | POA: Diagnosis not present

## 2017-09-28 DIAGNOSIS — J189 Pneumonia, unspecified organism: Secondary | ICD-10-CM | POA: Diagnosis not present

## 2017-09-28 DIAGNOSIS — Z79891 Long term (current) use of opiate analgesic: Secondary | ICD-10-CM | POA: Diagnosis not present

## 2017-09-28 DIAGNOSIS — Z792 Long term (current) use of antibiotics: Secondary | ICD-10-CM | POA: Diagnosis not present

## 2017-09-28 DIAGNOSIS — D631 Anemia in chronic kidney disease: Secondary | ICD-10-CM | POA: Diagnosis not present

## 2017-09-28 DIAGNOSIS — I5032 Chronic diastolic (congestive) heart failure: Secondary | ICD-10-CM | POA: Diagnosis not present

## 2017-09-28 DIAGNOSIS — I132 Hypertensive heart and chronic kidney disease with heart failure and with stage 5 chronic kidney disease, or end stage renal disease: Secondary | ICD-10-CM | POA: Diagnosis not present

## 2017-09-28 DIAGNOSIS — N186 End stage renal disease: Secondary | ICD-10-CM | POA: Diagnosis not present

## 2017-09-28 DIAGNOSIS — I4891 Unspecified atrial fibrillation: Secondary | ICD-10-CM | POA: Diagnosis not present

## 2017-09-30 DIAGNOSIS — L299 Pruritus, unspecified: Secondary | ICD-10-CM | POA: Diagnosis not present

## 2017-09-30 DIAGNOSIS — N186 End stage renal disease: Secondary | ICD-10-CM | POA: Diagnosis not present

## 2017-09-30 DIAGNOSIS — Z7951 Long term (current) use of inhaled steroids: Secondary | ICD-10-CM | POA: Diagnosis not present

## 2017-09-30 DIAGNOSIS — Z79899 Other long term (current) drug therapy: Secondary | ICD-10-CM | POA: Diagnosis not present

## 2017-09-30 DIAGNOSIS — Z8543 Personal history of malignant neoplasm of ovary: Secondary | ICD-10-CM | POA: Diagnosis not present

## 2017-09-30 DIAGNOSIS — Z8673 Personal history of transient ischemic attack (TIA), and cerebral infarction without residual deficits: Secondary | ICD-10-CM | POA: Diagnosis not present

## 2017-09-30 DIAGNOSIS — Z992 Dependence on renal dialysis: Secondary | ICD-10-CM | POA: Diagnosis not present

## 2017-09-30 DIAGNOSIS — I120 Hypertensive chronic kidney disease with stage 5 chronic kidney disease or end stage renal disease: Secondary | ICD-10-CM | POA: Diagnosis not present

## 2017-09-30 DIAGNOSIS — Z7902 Long term (current) use of antithrombotics/antiplatelets: Secondary | ICD-10-CM | POA: Diagnosis not present

## 2017-10-02 ENCOUNTER — Telehealth: Payer: Self-pay | Admitting: *Deleted

## 2017-10-02 DIAGNOSIS — N186 End stage renal disease: Secondary | ICD-10-CM | POA: Diagnosis not present

## 2017-10-02 DIAGNOSIS — Z992 Dependence on renal dialysis: Secondary | ICD-10-CM | POA: Diagnosis not present

## 2017-10-02 NOTE — Telephone Encounter (Signed)
Patient called while having dialysis to say that she thinks brilinta is causing her to itch. Brilinta started  3 months ago and itching started 2 weeks ago per patient. Patient said that the itching got worse over the weekend. Took benadryl and hydroxyzine with no relief. No changes to soaps, perfume, lotions, deodorant or detergents. No fever or rash. Patient advised that itching could be coming from her kidney disease but a message would be sent to her provider for advice.

## 2017-10-02 NOTE — Telephone Encounter (Signed)
Patient informed and verbalized understanding

## 2017-10-02 NOTE — Telephone Encounter (Signed)
Unlikely from [place]. If she was allergic, the reaction would have started much earlier.

## 2017-10-03 DIAGNOSIS — I4891 Unspecified atrial fibrillation: Secondary | ICD-10-CM | POA: Diagnosis not present

## 2017-10-03 DIAGNOSIS — N186 End stage renal disease: Secondary | ICD-10-CM | POA: Diagnosis not present

## 2017-10-03 DIAGNOSIS — Z79891 Long term (current) use of opiate analgesic: Secondary | ICD-10-CM | POA: Diagnosis not present

## 2017-10-03 DIAGNOSIS — J40 Bronchitis, not specified as acute or chronic: Secondary | ICD-10-CM | POA: Diagnosis not present

## 2017-10-03 DIAGNOSIS — Z792 Long term (current) use of antibiotics: Secondary | ICD-10-CM | POA: Diagnosis not present

## 2017-10-03 DIAGNOSIS — D631 Anemia in chronic kidney disease: Secondary | ICD-10-CM | POA: Diagnosis not present

## 2017-10-03 DIAGNOSIS — J189 Pneumonia, unspecified organism: Secondary | ICD-10-CM | POA: Diagnosis not present

## 2017-10-03 DIAGNOSIS — I132 Hypertensive heart and chronic kidney disease with heart failure and with stage 5 chronic kidney disease, or end stage renal disease: Secondary | ICD-10-CM | POA: Diagnosis not present

## 2017-10-03 DIAGNOSIS — I5032 Chronic diastolic (congestive) heart failure: Secondary | ICD-10-CM | POA: Diagnosis not present

## 2017-10-05 ENCOUNTER — Telehealth: Payer: Self-pay | Admitting: Cardiovascular Disease

## 2017-10-05 DIAGNOSIS — Z79891 Long term (current) use of opiate analgesic: Secondary | ICD-10-CM | POA: Diagnosis not present

## 2017-10-05 DIAGNOSIS — I4891 Unspecified atrial fibrillation: Secondary | ICD-10-CM | POA: Diagnosis not present

## 2017-10-05 DIAGNOSIS — I132 Hypertensive heart and chronic kidney disease with heart failure and with stage 5 chronic kidney disease, or end stage renal disease: Secondary | ICD-10-CM | POA: Diagnosis not present

## 2017-10-05 DIAGNOSIS — Z792 Long term (current) use of antibiotics: Secondary | ICD-10-CM | POA: Diagnosis not present

## 2017-10-05 DIAGNOSIS — D631 Anemia in chronic kidney disease: Secondary | ICD-10-CM | POA: Diagnosis not present

## 2017-10-05 DIAGNOSIS — N186 End stage renal disease: Secondary | ICD-10-CM | POA: Diagnosis not present

## 2017-10-05 DIAGNOSIS — I5032 Chronic diastolic (congestive) heart failure: Secondary | ICD-10-CM | POA: Diagnosis not present

## 2017-10-05 DIAGNOSIS — J189 Pneumonia, unspecified organism: Secondary | ICD-10-CM | POA: Diagnosis not present

## 2017-10-05 MED ORDER — TICAGRELOR 60 MG PO TABS: 60.0000 mg | ORAL_TABLET | Freq: Two times a day (BID) | ORAL | 3 refills | Status: DC

## 2017-10-05 MED ORDER — TICAGRELOR 90 MG PO TABS: 90.0000 mg | ORAL_TABLET | Freq: Two times a day (BID) | ORAL | 0 refills | Status: DC

## 2017-10-05 NOTE — Telephone Encounter (Signed)
Pt aware that on 11/08/2017 would need to decrease Brilinta 60 mg bid per LOV - pt will come get samples of 90 mg Brilinta to save on cost. Also sent rx to [facility] 60 mg Brilinta with start date of 11/08/2017 - pt voiced understanding and appreciative

## 2017-10-05 NOTE — Telephone Encounter (Signed)
LM to return call.

## 2017-10-05 NOTE — Telephone Encounter (Signed)
·   Patient called stating that she needs clarification on Brilinta 60 mg. . States that she is not sure if she is suppose to be on 60 mg or 90.   Eben Burow, [place]  Please call [phone]

## 2017-10-06 ENCOUNTER — Other Ambulatory Visit: Payer: Self-pay | Admitting: Urology

## 2017-10-06 DIAGNOSIS — D3001 Benign neoplasm of right kidney: Secondary | ICD-10-CM

## 2017-10-08 DIAGNOSIS — N186 End stage renal disease: Secondary | ICD-10-CM | POA: Diagnosis not present

## 2017-10-08 DIAGNOSIS — Z992 Dependence on renal dialysis: Secondary | ICD-10-CM | POA: Diagnosis not present

## 2017-10-09 DIAGNOSIS — Z992 Dependence on renal dialysis: Secondary | ICD-10-CM | POA: Diagnosis not present

## 2017-10-09 DIAGNOSIS — N186 End stage renal disease: Secondary | ICD-10-CM | POA: Diagnosis not present

## 2017-10-10 DIAGNOSIS — D631 Anemia in chronic kidney disease: Secondary | ICD-10-CM | POA: Diagnosis not present

## 2017-10-10 DIAGNOSIS — I4891 Unspecified atrial fibrillation: Secondary | ICD-10-CM | POA: Diagnosis not present

## 2017-10-10 DIAGNOSIS — I5032 Chronic diastolic (congestive) heart failure: Secondary | ICD-10-CM | POA: Diagnosis not present

## 2017-10-10 DIAGNOSIS — N186 End stage renal disease: Secondary | ICD-10-CM | POA: Diagnosis not present

## 2017-10-10 DIAGNOSIS — J189 Pneumonia, unspecified organism: Secondary | ICD-10-CM | POA: Diagnosis not present

## 2017-10-10 DIAGNOSIS — Z792 Long term (current) use of antibiotics: Secondary | ICD-10-CM | POA: Diagnosis not present

## 2017-10-10 DIAGNOSIS — Z79891 Long term (current) use of opiate analgesic: Secondary | ICD-10-CM | POA: Diagnosis not present

## 2017-10-10 DIAGNOSIS — I132 Hypertensive heart and chronic kidney disease with heart failure and with stage 5 chronic kidney disease, or end stage renal disease: Secondary | ICD-10-CM | POA: Diagnosis not present

## 2017-10-12 DIAGNOSIS — N186 End stage renal disease: Secondary | ICD-10-CM | POA: Diagnosis not present

## 2017-10-12 DIAGNOSIS — Z792 Long term (current) use of antibiotics: Secondary | ICD-10-CM | POA: Diagnosis not present

## 2017-10-12 DIAGNOSIS — I132 Hypertensive heart and chronic kidney disease with heart failure and with stage 5 chronic kidney disease, or end stage renal disease: Secondary | ICD-10-CM | POA: Diagnosis not present

## 2017-10-12 DIAGNOSIS — D631 Anemia in chronic kidney disease: Secondary | ICD-10-CM | POA: Diagnosis not present

## 2017-10-12 DIAGNOSIS — Z79891 Long term (current) use of opiate analgesic: Secondary | ICD-10-CM | POA: Diagnosis not present

## 2017-10-12 DIAGNOSIS — J189 Pneumonia, unspecified organism: Secondary | ICD-10-CM | POA: Diagnosis not present

## 2017-10-12 DIAGNOSIS — I5032 Chronic diastolic (congestive) heart failure: Secondary | ICD-10-CM | POA: Diagnosis not present

## 2017-10-12 DIAGNOSIS — I4891 Unspecified atrial fibrillation: Secondary | ICD-10-CM | POA: Diagnosis not present

## 2017-10-15 ENCOUNTER — Observation Stay (HOSPITAL_COMMUNITY)
Admission: EM | Admit: 2017-10-15 | Discharge: 2017-10-16 | Disposition: A | Discharge status: Home / Self Care | Payer: Medicare HMO | Attending: Internal Medicine | Admitting: Internal Medicine

## 2017-10-15 ENCOUNTER — Emergency Department (HOSPITAL_COMMUNITY): Payer: Medicare HMO

## 2017-10-15 ENCOUNTER — Encounter (HOSPITAL_COMMUNITY): Payer: Self-pay | Admitting: Emergency Medicine

## 2017-10-15 ENCOUNTER — Other Ambulatory Visit: Payer: Self-pay

## 2017-10-15 DIAGNOSIS — E211 Secondary hyperparathyroidism, not elsewhere classified: Secondary | ICD-10-CM | POA: Insufficient documentation

## 2017-10-15 DIAGNOSIS — I132 Hypertensive heart and chronic kidney disease with heart failure and with stage 5 chronic kidney disease, or end stage renal disease: Secondary | ICD-10-CM | POA: Diagnosis not present

## 2017-10-15 DIAGNOSIS — Z8543 Personal history of malignant neoplasm of ovary: Secondary | ICD-10-CM | POA: Insufficient documentation

## 2017-10-15 DIAGNOSIS — I5032 Chronic diastolic (congestive) heart failure: Secondary | ICD-10-CM | POA: Diagnosis not present

## 2017-10-15 DIAGNOSIS — Z955 Presence of coronary angioplasty implant and graft: Secondary | ICD-10-CM | POA: Diagnosis not present

## 2017-10-15 DIAGNOSIS — I10 Essential (primary) hypertension: Secondary | ICD-10-CM | POA: Diagnosis present

## 2017-10-15 DIAGNOSIS — D649 Anemia, unspecified: Secondary | ICD-10-CM | POA: Insufficient documentation

## 2017-10-15 DIAGNOSIS — E782 Mixed hyperlipidemia: Secondary | ICD-10-CM | POA: Diagnosis not present

## 2017-10-15 DIAGNOSIS — E785 Hyperlipidemia, unspecified: Secondary | ICD-10-CM | POA: Insufficient documentation

## 2017-10-15 DIAGNOSIS — R0789 Other chest pain: Secondary | ICD-10-CM | POA: Diagnosis not present

## 2017-10-15 DIAGNOSIS — R079 Chest pain, unspecified: Secondary | ICD-10-CM

## 2017-10-15 DIAGNOSIS — Z992 Dependence on renal dialysis: Secondary | ICD-10-CM

## 2017-10-15 DIAGNOSIS — N2581 Secondary hyperparathyroidism of renal origin: Secondary | ICD-10-CM | POA: Diagnosis present

## 2017-10-15 DIAGNOSIS — I2510 Atherosclerotic heart disease of native coronary artery without angina pectoris: Secondary | ICD-10-CM | POA: Diagnosis not present

## 2017-10-15 DIAGNOSIS — I480 Paroxysmal atrial fibrillation: Secondary | ICD-10-CM | POA: Diagnosis not present

## 2017-10-15 DIAGNOSIS — I471 Supraventricular tachycardia: Secondary | ICD-10-CM | POA: Insufficient documentation

## 2017-10-15 DIAGNOSIS — E871 Hypo-osmolality and hyponatremia: Secondary | ICD-10-CM | POA: Diagnosis not present

## 2017-10-15 DIAGNOSIS — D631 Anemia in chronic kidney disease: Secondary | ICD-10-CM | POA: Diagnosis not present

## 2017-10-15 DIAGNOSIS — K219 Gastro-esophageal reflux disease without esophagitis: Secondary | ICD-10-CM | POA: Diagnosis present

## 2017-10-15 DIAGNOSIS — I120 Hypertensive chronic kidney disease with stage 5 chronic kidney disease or end stage renal disease: Secondary | ICD-10-CM | POA: Diagnosis not present

## 2017-10-15 DIAGNOSIS — N189 Chronic kidney disease, unspecified: Secondary | ICD-10-CM | POA: Diagnosis present

## 2017-10-15 DIAGNOSIS — I5022 Chronic systolic (congestive) heart failure: Secondary | ICD-10-CM | POA: Diagnosis present

## 2017-10-15 DIAGNOSIS — N186 End stage renal disease: Secondary | ICD-10-CM | POA: Diagnosis not present

## 2017-10-15 DIAGNOSIS — N185 Chronic kidney disease, stage 5: Secondary | ICD-10-CM | POA: Diagnosis not present

## 2017-10-15 DIAGNOSIS — I5042 Chronic combined systolic (congestive) and diastolic (congestive) heart failure: Secondary | ICD-10-CM | POA: Diagnosis present

## 2017-10-15 LAB — DIFFERENTIAL
BAND NEUTROPHILS: 0 %
BASOS PCT: 1 %
Basophils Absolute: 0.1 10*3/uL (ref 0.0–0.1)
Blasts: 0 %
EOS ABS: 0.3 10*3/uL (ref 0.0–0.7)
Eosinophils Relative: 4 %
LYMPHS ABS: 1.1 10*3/uL (ref 0.7–4.0)
LYMPHS PCT: 13 %
METAMYELOCYTES PCT: 0 %
MYELOCYTES: 0 %
Monocytes Absolute: 0.3 10*3/uL (ref 0.1–1.0)
Monocytes Relative: 4 %
NEUTROS ABS: 6.3 10*3/uL (ref 1.7–7.7)
NRBC: 0 /100{WBCs}
Neutrophils Relative %: 78 %
OTHER: 0 %
PROMYELOCYTES RELATIVE: 0 %
Smear Review: ADEQUATE

## 2017-10-15 LAB — BASIC METABOLIC PANEL
Anion gap: 16 — ABNORMAL HIGH (ref 5–15)
BUN: 33 mg/dL — AB (ref 6–20)
CALCIUM: 9.6 mg/dL (ref 8.9–10.3)
CO2: 24 mmol/L (ref 22–32)
Chloride: 91 mmol/L — ABNORMAL LOW (ref 101–111)
Creatinine, Ser: 6.9 mg/dL — ABNORMAL HIGH (ref 0.44–1.00)
GFR calc Af Amer: 6 mL/min — ABNORMAL LOW (ref >60)
GFR calc non Af Amer: 5 mL/min — ABNORMAL LOW (ref >60)
GLUCOSE: 111 mg/dL — AB (ref 65–99)
POTASSIUM: 3.7 mmol/L (ref 3.5–5.1)
Sodium: 131 mmol/L — ABNORMAL LOW (ref 135–145)

## 2017-10-15 LAB — CBC
HEMATOCRIT: 36.3 % (ref 36.0–46.0)
HEMOGLOBIN: 11.4 g/dL — AB (ref 12.0–15.0)
MCH: 31.6 pg (ref 26.0–34.0)
MCHC: 31.4 g/dL (ref 30.0–36.0)
MCV: 100.6 fL — ABNORMAL HIGH (ref 78.0–100.0)
Platelets: ADEQUATE 10*3/uL (ref 150–400)
RBC: 3.61 MIL/uL — ABNORMAL LOW (ref 3.87–5.11)
RDW: 17.7 % — AB (ref 11.5–15.5)
WBC: 8.1 10*3/uL (ref 4.0–10.5)

## 2017-10-15 LAB — I-STAT TROPONIN, ED: Troponin i, poc: 0.02 ng/mL (ref 0.00–0.08)

## 2017-10-15 LAB — TROPONIN I: Troponin I: <0.03 ng/mL (ref <0.03)

## 2017-10-15 IMAGING — DX DG CHEST 2V
2 series · 2 of 2 positions shown · non-contrast
Comparison: [DATE]

CLINICAL DATA: Chest pain radiating to the left arm.

EXAM:
CHEST - 2 VIEW

[chest pa]
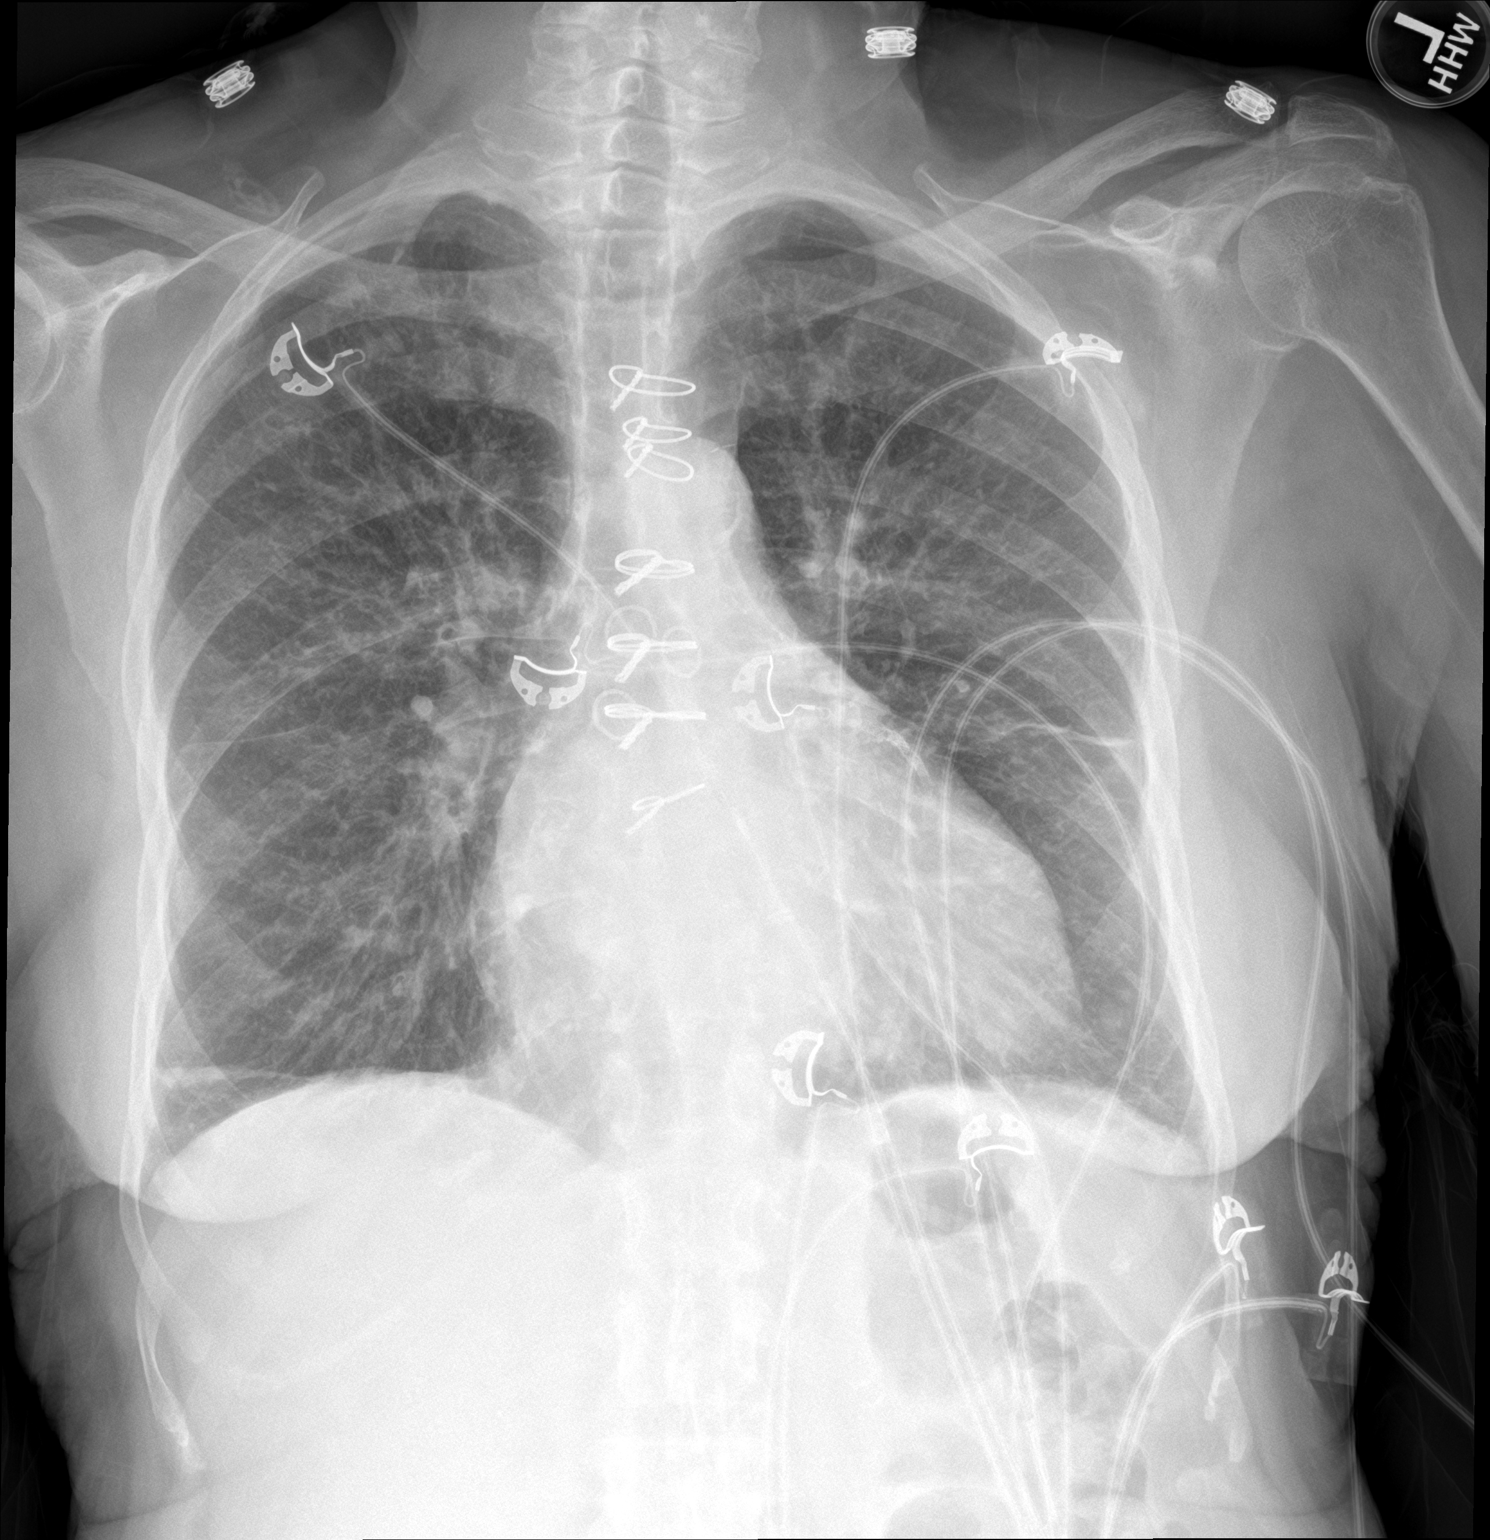

[chest lat]
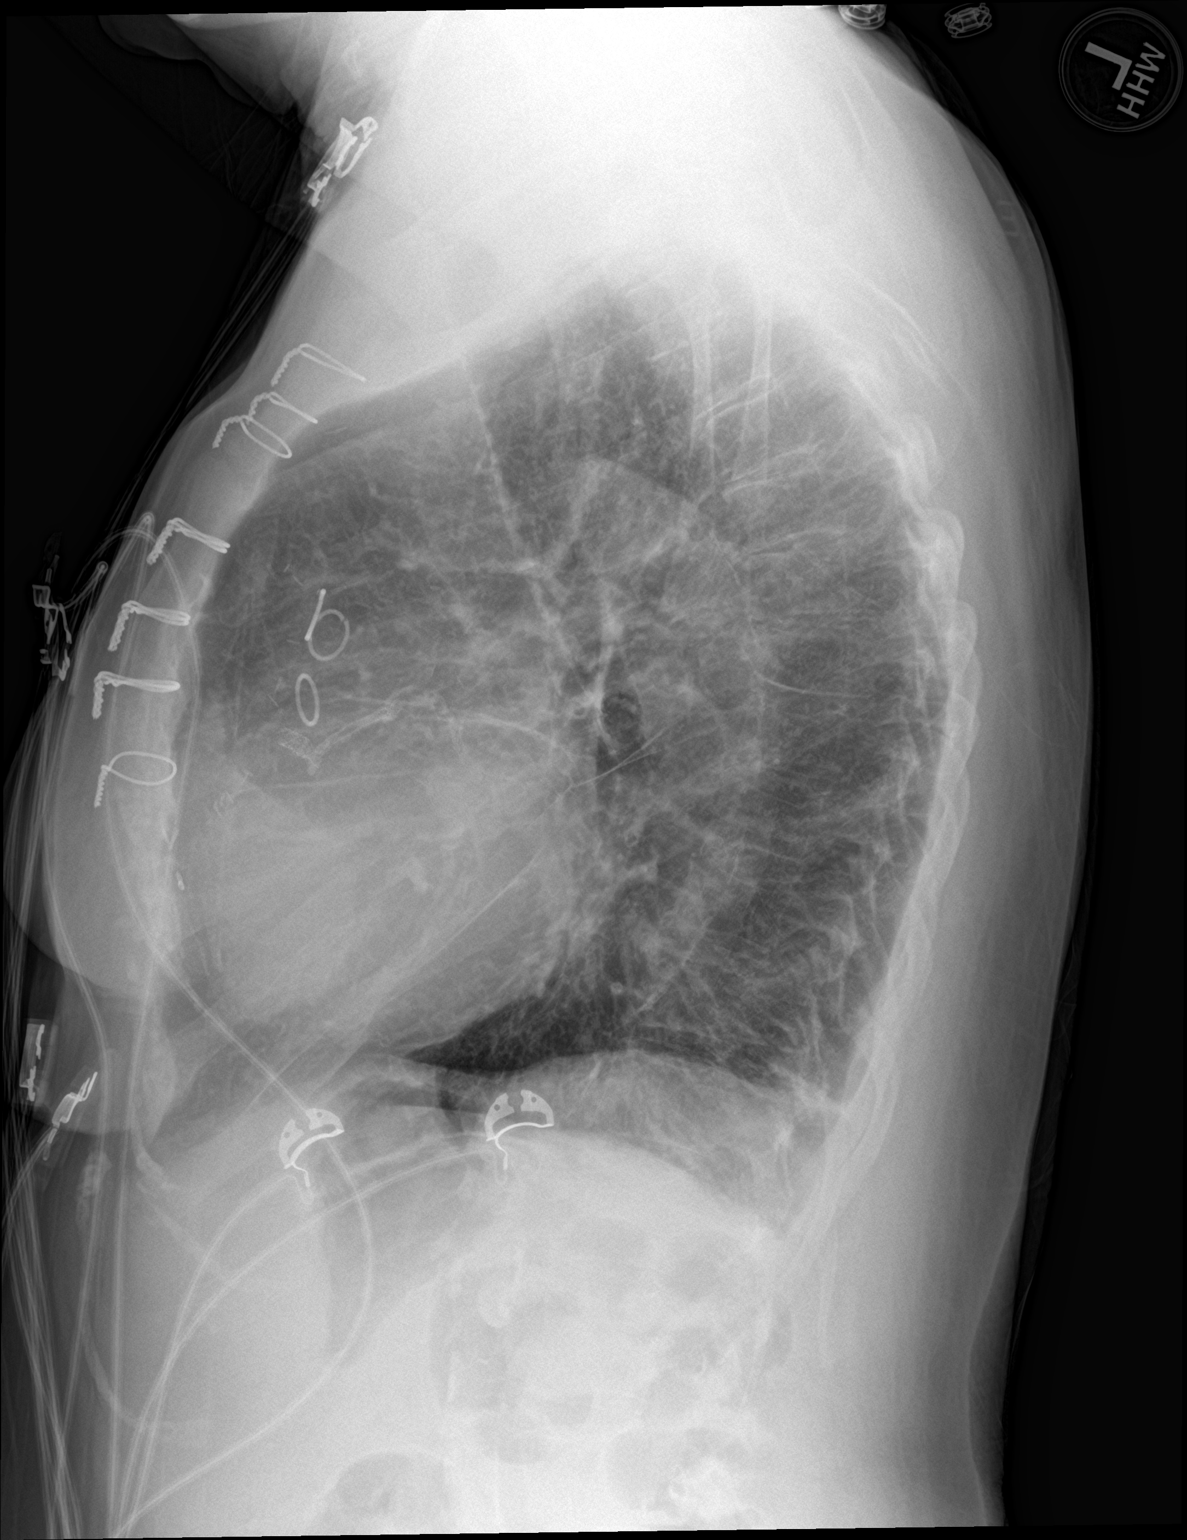

[2 of 2 positions shown; findings below may reference images not displayed]

FINDINGS: Postoperative changes in the mediastinum. Heart size and pulmonary
vascularity are normal. Peribronchial thickening with diffuse
interstitial pattern to the lungs likely represent chronic
bronchitis. Linear scarring in the left mid lung and right lung
base. No focal consolidation. No blunting of costophrenic angles. No
pneumothorax. Mediastinal contours appear intact. Calcification of
the aorta.
IMPRESSION: Peribronchial thickening with diffuse interstitial pattern in the
lungs likely represents chronic bronchitis. Scarring in the left mid
lung and right lower lung. No focal consolidation. Aortic
atherosclerosis.

## 2017-10-15 MED ORDER — SIMVASTATIN 20 MG PO TABS
20.0000 mg | ORAL_TABLET | Freq: Every day | ORAL | Status: DC
  Administered 2017-10-15: 20 mg via ORAL
  Filled 2017-10-15: qty 1

## 2017-10-15 MED ORDER — TICAGRELOR 90 MG PO TABS
90.0000 mg | ORAL_TABLET | Freq: Two times a day (BID) | ORAL | Status: DC
  Administered 2017-10-15 (×2): 90 mg via ORAL
  Filled 2017-10-15 (×2): qty 1

## 2017-10-15 MED ORDER — AMIODARONE HCL 200 MG PO TABS
200.0000 mg | ORAL_TABLET | Freq: Every day | ORAL | Status: DC
  Administered 2017-10-15: 200 mg via ORAL
  Filled 2017-10-15: qty 1

## 2017-10-15 MED ORDER — FLUTICASONE PROPIONATE 50 MCG/ACT NA SUSP
1.0000 | Freq: Every evening | NASAL | Status: DC | PRN
  Administered 2017-10-16: 1 via NASAL
  Filled 2017-10-15 (×2): qty 16

## 2017-10-15 MED ORDER — CLONIDINE HCL 0.2 MG PO TABS: 0.2000 mg | ORAL_TABLET | Freq: Every day | ORAL | Status: DC | PRN

## 2017-10-15 MED ORDER — ONDANSETRON HCL 4 MG/2ML IJ SOLN: 4.0000 mg | Freq: Four times a day (QID) | INTRAMUSCULAR | Status: DC | PRN

## 2017-10-15 MED ORDER — PANTOPRAZOLE SODIUM 40 MG PO TBEC
40.0000 mg | DELAYED_RELEASE_TABLET | Freq: Every day | ORAL | Status: DC
  Administered 2017-10-15: 40 mg via ORAL
  Filled 2017-10-15: qty 1

## 2017-10-15 MED ORDER — ASPIRIN 81 MG PO CHEW
243.0000 mg | CHEWABLE_TABLET | Freq: Once | ORAL | Status: AC
  Administered 2017-10-15: 243 mg via ORAL
  Filled 2017-10-15: qty 3

## 2017-10-15 MED ORDER — NITROGLYCERIN 0.4 MG SL SUBL
0.4000 mg | SUBLINGUAL_TABLET | SUBLINGUAL | Status: DC | PRN
  Administered 2017-10-15: 0.4 mg via SUBLINGUAL
  Filled 2017-10-15 (×2): qty 1

## 2017-10-15 MED ORDER — SEVELAMER CARBONATE 800 MG PO TABS
2400.0000 mg | ORAL_TABLET | Freq: Three times a day (TID) | ORAL | Status: DC
  Administered 2017-10-15 – 2017-10-16 (×4): 2400 mg via ORAL
  Filled 2017-10-15 (×4): qty 3

## 2017-10-15 MED ORDER — LIDOCAINE-PRILOCAINE 2.5-2.5 % EX CREA: 1.0000 "application " | TOPICAL_CREAM | CUTANEOUS | Status: DC

## 2017-10-15 MED ORDER — ACETAMINOPHEN 325 MG PO TABS: 650.0000 mg | ORAL_TABLET | ORAL | Status: DC | PRN

## 2017-10-15 MED ORDER — ALBUTEROL SULFATE (2.5 MG/3ML) 0.083% IN NEBU
2.5000 mg | INHALATION_SOLUTION | Freq: Four times a day (QID) | RESPIRATORY_TRACT | Status: DC | PRN
  Administered 2017-10-15: 2.5 mg via RESPIRATORY_TRACT
  Filled 2017-10-15: qty 3

## 2017-10-15 MED ORDER — ISOSORBIDE MONONITRATE ER 60 MG PO TB24
120.0000 mg | ORAL_TABLET | Freq: Every day | ORAL | Status: DC
  Administered 2017-10-15: 120 mg via ORAL
  Filled 2017-10-15: qty 2

## 2017-10-15 MED ORDER — GI COCKTAIL ~~LOC~~: 30.0000 mL | Freq: Four times a day (QID) | ORAL | Status: DC | PRN

## 2017-10-15 MED ORDER — FERROUS SULFATE 325 (65 FE) MG PO TABS
325.0000 mg | ORAL_TABLET | Freq: Two times a day (BID) | ORAL | Status: DC
  Administered 2017-10-15 – 2017-10-16 (×2): 325 mg via ORAL
  Filled 2017-10-15 (×2): qty 1

## 2017-10-15 MED ORDER — CINACALCET HCL 30 MG PO TABS
30.0000 mg | ORAL_TABLET | Freq: Every day | ORAL | Status: DC
  Administered 2017-10-15 – 2017-10-16 (×2): 30 mg via ORAL
  Filled 2017-10-15 (×2): qty 1

## 2017-10-15 MED ORDER — SEVELAMER CARBONATE 800 MG PO TABS
800.0000 mg | ORAL_TABLET | ORAL | Status: DC
  Administered 2017-10-15: 800 mg via ORAL
  Filled 2017-10-15: qty 1

## 2017-10-15 MED ORDER — RENA-VITE PO TABS
1.0000 | ORAL_TABLET | Freq: Every day | ORAL | Status: DC
  Administered 2017-10-15: 1 via ORAL
  Filled 2017-10-15: qty 1

## 2017-10-15 MED ORDER — MORPHINE SULFATE (PF) 2 MG/ML IV SOLN: 2.0000 mg | INTRAVENOUS | Status: DC | PRN

## 2017-10-15 MED ORDER — CARVEDILOL 3.125 MG PO TABS
6.2500 mg | ORAL_TABLET | Freq: Two times a day (BID) | ORAL | Status: DC
  Administered 2017-10-15 – 2017-10-16 (×3): 6.25 mg via ORAL
  Filled 2017-10-15 (×3): qty 2

## 2017-10-15 MED ORDER — HEPARIN SODIUM (PORCINE) 5000 UNIT/ML IJ SOLN
5000.0000 [IU] | Freq: Three times a day (TID) | INTRAMUSCULAR | Status: DC
  Administered 2017-10-15: 5000 [IU] via SUBCUTANEOUS
  Filled 2017-10-15: qty 1

## 2017-10-15 MED ORDER — BISACODYL 5 MG PO TBEC
5.0000 mg | DELAYED_RELEASE_TABLET | Freq: Every day | ORAL | Status: DC | PRN
  Administered 2017-10-15: 5 mg via ORAL
  Filled 2017-10-15: qty 1

## 2017-10-15 MED ORDER — HYDRALAZINE HCL 20 MG/ML IJ SOLN
10.0000 mg | Freq: Three times a day (TID) | INTRAMUSCULAR | Status: DC | PRN
  Administered 2017-10-15: 10 mg via INTRAVENOUS
  Filled 2017-10-15: qty 1

## 2017-10-15 MED ORDER — ALPRAZOLAM 0.25 MG PO TABS
0.2500 mg | ORAL_TABLET | Freq: Two times a day (BID) | ORAL | Status: DC | PRN
  Administered 2017-10-15: 0.25 mg via ORAL
  Filled 2017-10-15: qty 1

## 2017-10-15 MED ORDER — ASPIRIN EC 81 MG PO TBEC
81.0000 mg | DELAYED_RELEASE_TABLET | Freq: Every day | ORAL | Status: DC
  Administered 2017-10-15: 81 mg via ORAL
  Filled 2017-10-15: qty 1

## 2017-10-15 NOTE — Progress Notes (Signed)
In bed eating supper.  Denies chest pain

## 2017-10-15 NOTE — Consult Note (Signed)
Reason for Consult: End-stage renal disease Referring Physician: Dr. Bary Castilla Shawna [name] is an 77 y.o. female.  HPI: She is a patient was coronary artery disease, hypertension, end-stage renal disease on maintenance hemodialysis presently came with complaints of left-sided chest pain.  The pain is pressure-like mainly on the left side with radiation to her shoulder and neck.  She denies any difficulty breathing or orthopnea.  Presently she is feeling much better.  Presently consult is called to make arrangement for her dialysis.  Past Medical History:  Diagnosis Date  . Acute on chronic respiratory failure with hypoxia ([place]) 10/10/2016  . Anxiety   . Arthritis   . AVM (arteriovenous malformation) of colon   . CAD (coronary artery disease)    a. 12/2011 NSTEMI/Cath/PCI LCX (2.25x14 Resolute DES) & D1 (2.25x22 Resolute DES);  b. 01/2012 Cath/PCI: LM 30, LAD 30p, 40-75m D1 stent ok, 99 in sm branch of diag, LCX patent stent, OM1 20, RCA 95 ost (4.0x12 Promus DES), EF 55%;  c. 04/2012 Lexi Cardiolite  EF 48%, small area of scar @ base/mid inflat wall with mild peri-infarct ischemia.; CABG 12/4  . Carotid artery disease ([place]    a. 645-36%LICA, 94/6803  . Chronic bronchitis ([place]   . Chronic diastolic CHF (congestive heart failure) ([place]    a. 02/2012 Echo EF 60-65%, nl wall motion, Gr 1 DD, mod MR  . Colon cancer ([place] 1992  . Esophageal stricture   . [place]D [place]ialysis ([place]    ESRD due to HTN, started dialysis 2011 [facility]  Access is LUA AVF as of Sept 2014.   .Marland KitchenGERD (gastroesophageal reflux disease)   . High cholesterol 12/2011  . History of blood transfusion 07/2011; 12/2011; 01/2012 X 2; 04/2012  . History of gout   . History of lower GI bleeding   . Hypertension   . Iron deficiency anemia   . Mitral regurgitation    a. Moderate by echo, 02/2012  . Myocardial infarction ([place]   . Ovarian cancer ([place] 1992  . Pneumonia ~ 2009  . PUD  (peptic ulcer disease)   . TIA (transient ischemic attack)     Past Surgical History:  Procedure Laterality Date  . ABDOMINAL HYSTERECTOMY  1992  . APPENDECTOMY  06/1990  . AV FISTULA PLACEMENT  07/2009   left upper arm  . AV FISTULA PLACEMENT Right 09/06/2016   Procedure: RIGHT FOREARM ARTERIOVENOUS (AV) GRAFT;  Surgeon: CElam Dutch MD;  [facility]  Service: Vascular;  Laterality: Right;  . AV FISTULA PLACEMENT N/A 02/24/2017   Procedure: INSERTION OF ARTERIOVENOUS (AV) GORE-TEX GRAFT ARM (BRACHIAL AXILLARY);  Surgeon: SKatha Cabal MD;  Location: [facility] ORS;  Service: Vascular;  Laterality: N/A;  . AF[place]Right 09/06/2016   Procedure: REMOVAL OF Right Arm ARTERIOVENOUS GORETEX GRAFT and Vein Patch angioplasty of brachial artery;  Surgeon: CAngelia Mould MD;  [place]  Service: Vascular;  Laterality: Right;  . COLON RESECTION  1992  . COLON SURGERY    . CORONARY ANGIOPLASTY WITH STENT PLACEMENT  12/15/11   "2"  . CORONARY ANGIOPLASTY WITH STENT PLACEMENT  y/2013   "1; makes total of 3" (05/02/2012)  . CORONARY ARTERY BYPASS GRAFT  06/13/2012   Procedure: CORONARY ARTERY BYPASS GRAFTING (CABG);  Surgeon: EGrace Isaac MD;  [place]  Service: Open Heart Surgery;  Laterality: N/A;  cabg x four;  using  left internal mammary artery, and left leg greater saphenous vein harvested endoscopically  . CORONARY STENT INTERVENTION N/A 10/13/2016   Procedure: Coronary Stent Intervention;  Surgeon: Troy Sine, MD;  Location: [place] CV LAB;  Service: Cardiovascular;  Laterality: N/A;  . DIALYSIS/PERMA CATHETER REMOVAL N/A 04/18/2017   Procedure: DIALYSIS/PERMA CATHETER REMOVAL;  Surgeon: Katha Cabal, MD;  Location: [place] CV LAB;  Service: Cardiovascular;  Laterality: N/A;  . DILATION AND CURETTAGE OF UTERUS    . ESOPHAGOGASTRODUODENOSCOPY  01/20/2012   Procedure: ESOPHAGOGASTRODUODENOSCOPY (EGD);  Surgeon: Ladene Artist, MD,FACG;   Location: [facility] ENDOSCOPY;  Service: Endoscopy;  Laterality: N/A;  . ESOPHAGOGASTRODUODENOSCOPY N/A 03/26/2013   Procedure: ESOPHAGOGASTRODUODENOSCOPY (EGD);  Surgeon: Irene Shipper, MD;  Location[facility] ENDOSCOPY;  Service: Endoscopy;  Laterality: N/A;  . ESOPHAGOGASTRODUODENOSCOPY N/A 04/30/2015   Procedure: ESOPHAGOGASTRODUODENOSCOPY (EGD);  Surgeon: Rogene Houston, MD;  [facility];  Service: Endoscopy;  Laterality: N/A;  1pm - moved to 10/20 @ 1:10  . ESOPHAGOGASTRODUODENOSCOPY N/A 07/29/2016   Procedure: ESOPHAGOGASTRODUODENOSCOPY (EGD);  Surgeon: Manus Gunning, MD;  Location: [place];  Service: Gastroenterology;  Laterality: N/A;  enteroscopy  . INTRAOPERATIVE TRANSESOPHAGEAL ECHOCARDIOGRAM  06/13/2012   Procedure: INTRAOPERATIVE TRANSESOPHAGEAL ECHOCARDIOGRAM;  Surgeon: Grace Isaac, MD;  Location: [place];  Service: Open Heart Surgery;  Laterality: N/A;  . IR GENERIC HISTORICAL  07/26/2016   IR FLUORO GUIDE CV LINE RIGHT 07/26/2016 Greggory Keen, MD [facility]  . IR GENERIC HISTORICAL  07/26/2016   IR US GUIDE VASC ACCESS RIGHT 07/26/2016 Greggory Keen, MD [facility]  . IR GENERIC HISTORICAL  08/02/2016   IR US GUIDE VASC ACCESS RIGHT 08/02/2016 Greggory Keen, MD [facility]  . IR GENERIC HISTORICAL  08/02/2016   IR FLUORO GUIDE CV LINE RIGHT 08/02/2016 Greggory Keen, MD [facility]  . IR RADIOLOGY PERIPHERAL GUIDED IV START  03/28/2017  . IR US GUIDE VASC ACCESS RIGHT  03/28/2017  . LEFT HEART CATH AND CORONARY ANGIOGRAPHY N/A 09/20/2016   Procedure: Left Heart Cath and Coronary Angiography;  Surgeon: [name], MD;  Location: [place] CV LAB;  Service: Cardiovascular;  Laterality: N/A;  . LEFT HEART CATH AND CORS/GRAFTS ANGIOGRAPHY N/A 10/13/2016   Procedure: Left Heart Cath and Cors/Grafts Angiography;  Surgeon: Troy Sine, MD;  Location: [place] CV LAB;  Service: Cardiovascular;  Laterality: N/A;  . LEFT HEART CATHETERIZATION WITH CORONARY ANGIOGRAM  N/A 12/15/2011   Procedure: LEFT HEART CATHETERIZATION WITH CORONARY ANGIOGRAM;  Surgeon: Burnell Blanks, MD;  Location: [facility] CATH LAB;  Service: Cardiovascular;  Laterality: N/A;  . LEFT HEART CATHETERIZATION WITH CORONARY ANGIOGRAM N/A 01/10/2012   Procedure: LEFT HEART CATHETERIZATION WITH CORONARY ANGIOGRAM;  Surgeon: Peter M Martinique, MD;  Location: [facility] CATH LAB;  Service: Cardiovascular;  Laterality: N/A;  . LEFT HEART CATHETERIZATION WITH CORONARY ANGIOGRAM N/A 06/08/2012   Procedure: LEFT HEART CATHETERIZATION WITH CORONARY ANGIOGRAM;  Surgeon: Burnell Blanks, MD;  [facility] C[facility] LAB;  Service: Cardiovascular;  Laterality: N/A;  . LEFT HEART CATHETERIZATION WITH CORONARY/GRAFT ANGIOGRAM N/A 12/10/2013   Procedure: LEFT HEART CATHETERIZATION WITH Beatrix Fetters;  Surgeon: [name], MD;  Location: [facility] CATH LAB;  Service: Cardiovascular;  Laterality: N/A;  . OVARY SURGERY     ovarian cancer  . REVISION OF ARTERIOVENOUS GORETEX GRAFT N/A 02/24/2017   Procedure: REVISION OF ARTERIOVENOUS GORETEX GRAFT (RESECTION);  Surgeon: Katha Cabal, MD;  Location: [facility] ORS;  Service: Vascular;  Laterality: N/A;  . Earney Mallet  N/A 10/15/2013   Procedure: Fistulogram;  Surgeon: Serafina Mitchell, MD;  Location: [facility] CATH LAB;  Service: Cardiovascular;  Laterality: N/A;  . THROMBECTOMY / ARTERIOVENOUS GRAFT REVISION  2011   left upper arm  . TUBAL LIGATION  1980's  . UPPER EXTREMITY ANGIOGRAPHY Bilateral 12/06/2016   Procedure: Upper Extremity Angiography;  Surgeon: Katha Cabal, MD;  Location: [place] CV LAB;  Service: Cardiovascular;  Laterality: Bilateral;  . UPPER EXTREMITY INTERVENTION Left 06/06/2017   Procedure: UPPER EXTREMITY INTERVENTION;  Surgeon: Katha Cabal, MD;  Location: [place] CV LAB;  Service: Cardiovascular;  Laterality: Left;    Family History  Problem Relation Age of Onset  . Heart disease Mother        Heart Disease before age 20   . Hyperlipidemia Mother   . Hypertension Mother   . Diabetes Mother   . Heart attack Mother   . Heart disease Father        Heart Disease before age 32  . Hyperlipidemia Father   . Hypertension Father   . Diabetes Father   . Diabetes Sister   . Hypertension Sister   . Diabetes Brother   . Hyperlipidemia Brother   . Heart attack Brother   . Hypertension Sister   . Heart attack Brother   . Other Unknown        noncontributory for early CAD  . Colon cancer Neg Hx   . Esophageal cancer Neg Hx   . Liver disease Neg Hx   . Kidney disease Neg Hx   . Colon polyps Neg Hx     Social History:  reports that she has never smoked. She has never used smokeless tobacco. She reports that she does not drink alcohol or use drugs.  Allergies:  Allergies  Allergen Reactions  . Aspirin Other (See Comments)    High Doses Mess up her stomach; "makes my bowels have blood in them". Takes 81 mg EC Aspirin   . Penicillins Other (See Comments)    SYNCOPE? , "makes me real weak when I take it; like I'll pass out"  Has patient had a PCN reaction causing immediate rash, facial/tongue/throat swelling, SOB or lightheadedness with hypotension: Yes Has patient had a PCN reaction causing severe rash involving mucus membranes or skin necrosis: no Has patient had a PCN reaction that required hospitalization no Has patient had a PCN reaction occurring within the last 10 years: no If all of the above  . Amlodipine Swelling  . Bactrim [Sulfamethoxazole-Trimethoprim] Rash  . Contrast Media [Iodinated Diagnostic Agents] Itching  . Iron Itching and Other (See Comments)    "they gave me iron in dialysis; had to give me Benadryl cause I had to have the iron" (05/02/2012)  . Nitrofurantoin Hives  . Tylenol [Acetaminophen] Itching and Other (See Comments)    Makes her feet on fire per pt  . Gabapentin Other (See Comments)    Unknown reaction  . Dexilant [Dexlansoprazole] Other (See Comments)    Upset stomach   . Levaquin [Levofloxacin In D5w] Rash  . Morphine And Related Itching and Other (See Comments)    Itching in feet  . Plavix [Clopidogrel Bisulfate] Rash  . Protonix [Pantoprazole Sodium] Rash  . Venofer [Ferric Oxide] Itching and Other (See Comments)    Patient reports using Benadryl prior to doses as [facility]    Medications: I have reviewed the patient's current medications.  Results for orders placed or performed during the hospital encounter of 10/15/17 (  from the past 48 hour(s))  Basic metabolic panel     Status: Abnormal   Collection Time: 10/15/17  4:20 AM  Result Value Ref Range   Sodium 131 (L) 135 - 145 mmol/L   Potassium 3.7 3.5 - 5.1 mmol/L   Chloride 91 (L) 101 - 111 mmol/L   CO2 24 22 - 32 mmol/L   Glucose, Bld 111 (H) 65 - 99 mg/dL   BUN 33 (H) 6 - 20 mg/dL   Creatinine, Ser 6.90 (H) 0.44 - 1.00 mg/dL   Calcium 9.6 8.9 - 10.3 mg/dL   GFR calc non Af Amer 5 (L) >60 mL/min   GFR calc Af Amer 6 (L) >60 mL/min    Comment: (NOTE) The eGFR has been calculated using the CKD EPI equation. This calculation has not been validated in all clinical situations. eGFR's persistently <60 mL/min signify possible Chronic Kidney Disease.    Anion gap 16 (H) 5 - 15    Comment: Performed at [facility], [address], [place], [place] [address]  CBC     Status: Abnormal   Collection Time: 10/15/17  4:20 AM  Result Value Ref Range   WBC 8.1 4.0 - 10.5 K/uL   RBC 3.61 (L) 3.87 - 5.11 MIL/uL   Hemoglobin 11.4 (L) 12.0 - 15.0 g/dL   HCT 36.3 36.0 - 46.0 %   MCV 100.6 (H) 78.0 - 100.0 fL   MCH 31.6 26.0 - 34.0 pg   MCHC 31.4 30.0 - 36.0 g/dL   RDW 17.7 (H) 11.5 - 15.5 %   Platelets PLATELETS APPEAR ADEQUATE 150 - 400 K/uL    Comment: Performed at [facility], [address] [place], [place] [address]  Differential     Status: None   Collection Time: 10/15/17  4:20 AM  Result Value Ref Range   Neutrophils Relative % 78 %   Lymphocytes Relative 13 %   Monocytes Relative  4 %   Eosinophils Relative 4 %   Basophils Relative 1 %   Band Neutrophils 0 %   Metamyelocytes Relative 0 %   Myelocytes 0 %   Promyelocytes Relative 0 %   Blasts 0 %   nRBC 0 0 /100 WBC   Other 0 %   Neutro Abs 6.3 1.7 - 7.7 K/uL   Lymphs Abs 1.1 0.7 - 4.0 K/uL   Monocytes Absolute 0.3 0.1 - 1.0 K/uL   Eosinophils Absolute 0.3 0.0 - 0.7 K/uL   Basophils Absolute 0.1 0.0 - 0.1 K/uL   Smear Review PLATELETS APPEAR ADEQUATE     Comment: Performed at [name], [address], [place], [place] [address]  I-stat troponin, ED     Status: None   Collection Time: 10/15/17  4:29 AM  Result Value Ref Range   Troponin i, poc 0.02 0.00 - 0.08 ng/mL   Comment 3            Comment: Due to the release kinetics of cTnI, a negative result within the first hours of the onset of symptoms does not rule out myocardial infarction with certainty. If myocardial infarction is still suspected, repeat the test at appropriate intervals.     Dg Chest 2 View  Result Date: 10/15/2017 CLINICAL DATA:  Chest pain radiating to the left arm. EXAM: CHEST - 2 VIEW COMPARISON:  09/03/2017 FINDINGS: Postoperative changes in the mediastinum. Heart size and pulmonary vascularity are normal. Peribronchial thickening with diffuse interstitial pattern to the lungs likely represent chronic bronchitis. Linear scarring  in the left mid lung and right lung base. No focal consolidation. No blunting of costophrenic angles. No pneumothorax. Mediastinal contours appear intact. Calcification of the aorta. IMPRESSION: Peribronchial thickening with diffuse interstitial pattern in the lungs likely represents chronic bronchitis. Scarring in the left mid lung and right lower lung. No focal consolidation. Aortic atherosclerosis. Electronically Signed   By: Lucienne Capers M.D.   On: 10/15/2017 04:05    Review of Systems  Constitutional: Negative for chills and fever.  Respiratory: Negative for shortness of breath.   Cardiovascular:  Negative for chest pain (left side pressure like with some radiation to her left neck and shoulder) and orthopnea.  Gastrointestinal: Negative for nausea and vomiting.   Blood pressure (!) 197/54, pulse (!) 58, temperature 97.7 F (36.5 C), temperature source Oral, resp. rate 18, height 5' 1"  (1.549 m), weight 59.5 kg (131 lb 2.8 oz), SpO2 98 %. Physical Exam  Constitutional: She is oriented to person, place, and time. No distress.  Eyes: No scleral icterus.  Neck: No JVD present.  Cardiovascular: Normal rate and regular rhythm.  Respiratory: No respiratory distress. She has no wheezes.  GI: She exhibits no distension. There is no tenderness.  Musculoskeletal: She exhibits no edema.  Neurological: She is alert and oriented to person, place, and time.    Assessment/Plan: 1] chest pain: Recurrent problem.  Patient with multiple risk factors.  Presently seems to be possibly noncardiac. 2] end-stage renal disease: She is status post hemodialysis on Friday.  Potassium is good.  She does not have any sign of fluid overload and she is due for dialysis tomorrow which is her regular schedule. 3] hypertension her blood pressure is reasonably controlled 4] anemia: Her hemoglobin remains within our target goal. 5] bone and mineral disorder: Her calcium is a range but phosphorus is not available. 6] history of coronary artery disease: Status post CABG. Plan: We will make arrangement for patient to get dialysis tomorrow 2] will remove 2 L if systolic blood pressure remains above 90 3] we will check a renal panel and CBC in the morning  Lamyra Malcolm S 10/15/2017, 9:42 AM

## 2017-10-15 NOTE — ED Provider Notes (Signed)
[facility] EMERGENCY DEPARTMENT Provider Note   CSN: [id number] Arrival date & time: 10/15/17  0307     History   Chief Complaint No chief complaint on file.   HPI Shawna [name] is a 77 y.o. female.  The history is provided by the patient.  She has history of coronary artery disease, chronic diastolic heart failure, hypertension, hyperlipidemia, end-stage renal disease on hemodialysis every Monday-Wednesday-Friday, ovarian cancer.  She complains of pain in her left arm and in her chest starting about 1 AM.  Pain came on as she was trying to go to sleep.  She is unable to characterize the pain, but states it was rated at 9/10.  Nothing seemed to make it better and nothing seemed to make it worse.  She did take 2 nitroglycerin which reduced the pain down to 6/10.  She denies dyspnea, nausea, diaphoresis.  Past Medical History:  Diagnosis Date  . Acute on chronic respiratory failure with hypoxia ([place]) 10/10/2016  . Anxiety   . Arthritis   . AVM (arteriovenous malformation) of colon   . CAD (coronary artery disease)    a. 12/2011 NSTEMI/Cath/PCI LCX (2.25x14 Resolute DES) & D1 (2.25x22 Resolute DES);  b. 01/2012 Cath/PCI: LM 30, LAD 30p, 40-12m, D1 stent ok, 99 in sm branch of diag, LCX patent stent, OM1 20, RCA 95 ost (4.0x12 Promus DES), EF 55%;  c. 04/2012 Lexi Cardiolite  EF 48%, small area of scar @ base/mid inflat wall with mild peri-infarct ischemia.; CABG 12/4  . Carotid artery disease ([place])    a. 47-42% LICA, 11/9561   . Chronic bronchitis ([place])   . Chronic diastolic CHF (congestive heart failure) ([place])    a. 02/2012 Echo EF 60-65%, nl wall motion, Gr 1 DD, mod MR  . Colon cancer ([place]) 1992  . Esophageal stricture   . ESRD on hemodialysis ([place])    ESRD due to HTN, started dialysis 2011 and gets HD at [facility] with Dr Hinda Lenis on MWF schedule.  Access is LUA AVF as of Sept 2014.   Marland Kitchen GERD (gastroesophageal reflux disease)   . High cholesterol 12/2011  . History of blood  transfusion 07/2011; 12/2011; 01/2012 X 2; 04/2012  . History of gout   . History of lower GI bleeding   . Hypertension   . Iron deficiency anemia   . Mitral regurgitation    a. Moderate by echo, 02/2012  . Myocardial infarction ([place])   . Ovarian cancer ([place]) 1992  . Pneumonia ~ 2009  . PUD (peptic ulcer disease)   . TIA (transient ischemic attack)     Patient Active Problem List   Diagnosis Date Noted  . Hand steal syndrome ([place]) 08/01/2017  . Anemia 07/14/2017  . Coronary artery disease of bypass graft of native heart with stable angina pectoris ([place]) 06/05/2017  . Mesenteric ischemia ([place])   . Diverticulitis   . SBO (small bowel obstruction) ([place]) 03/22/2017  . Enteritis   . Complication of vascular access for dialysis 03/19/2017  . Preoperative clearance 01/25/2017  . Symptomatic anemia 10/24/2016  . H/O non-ST elevation myocardial infarction (NSTEMI) 10/24/2016  . Fluid overload 10/10/2016  . Complication from renal dialysis device 10/10/2016  . Non-ST elevation MI (NSTEMI) ([place])   . Encounter for fitting and adjustment of vascular catheter   . End stage renal disease ([place])   . ESRD (end stage renal disease) ([place])   . Heme positive stool   . Demand ischemia ([place]) 07/27/2016  . Hypertensive emergency 07/08/2016  .  Acute on chronic respiratory failure with hypoxia ([place])   . Cardiac arrest ([place])   . Palliative care encounter   . Goals of care, counseling/discussion   . Hypertensive crisis without congestive heart failure 05/09/2016  . Acute pulmonary edema ([place]) 04/06/2016  . Acute respiratory failure ([place]) 04/06/2016  . Hypertensive crisis 01/27/2016  . History of colon cancer 01/27/2016  . History of ovarian cancer 01/27/2016  . Hypertensive urgency 01/27/2016  . Narrow complex tachycardia ([place]) 09/08/2015  . SVT (supraventricular tachycardia) ([place]) 09/08/2015  . Influenza A 08/30/2015  . Acute on chronic diastolic CHF (congestive heart failure) ([place]) 05/04/2015  .  Unstable angina ([place]) 05/03/2015  . Essential hypertension   . Pain in joint, lower leg 08/14/2014  . Chest pain 11/26/2013  . Small bowel obstruction, partial ([place]) 05/29/2013  . Chronic diastolic CHF (congestive heart failure) ([place]) 03/22/2013  . GI bleeding 03/21/2013  . Acute blood loss anemia 03/21/2013  . Vaginal odor 03/12/2013  . Vaginal discharge 03/12/2013  . Occlusion and stenosis of carotid artery without mention of cerebral infarction 01/24/2013  . Hx of CABG 07/05/2012  . Carotid artery disease ([place]) 07/05/2012  . Anemia of chronic kidney failure 07/03/2012  . Secondary hyperparathyroidism ([place]) 07/03/2012  . Mitral regurgitation 06/12/2012  . Pneumonia 06/09/2012  . Non-STEMI (non-ST elevated myocardial infarction) ([place]) 06/08/2012  . 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 GERD (gastroesophageal reflux disease) 01/09/2012  . HLD (hyperlipidemia) 01/05/2012  . Atherosclerosis of native coronary artery of native heart without angina pectoris 12/16/2011  . ESRD on hemodialysis ([place]) 12/16/2011    Past Surgical History:  Procedure Laterality Date  . ABDOMINAL HYSTERECTOMY  1992  . APPENDECTOMY  06/1990  . AV FISTULA PLACEMENT  07/2009   left upper arm  . AV FISTULA PLACEMENT Right 09/06/2016   Procedure: RIGHT FOREARM ARTERIOVENOUS (AV) GRAFT;  Surgeon: Elam Dutch, MD;  Location: [facility] OR;  Service: Vascular;  Laterality: Right;  . AV FISTULA PLACEMENT N/A 02/24/2017   Procedure: INSERTION OF ARTERIOVENOUS (AV) GORE-TEX GRAFT ARM (BRACHIAL AXILLARY);  Surgeon: Katha Cabal, MD;  Location: [facility] ORS;  Service: Vascular;  Laterality: N/A;  . [place] Right 09/06/2016   Procedure: REMOVAL OF Right Arm ARTERIOVENOUS GORETEX GRAFT and Vein Patch angioplasty of brachial artery;  Surgeon: Angelia Mould, MD;  Location: [place];  Service: Vascular;  Laterality: Right;  . COLON RESECTION  1992  . COLON SURGERY    . CORONARY  ANGIOPLASTY WITH STENT PLACEMENT  12/15/11   "2"  . CORONARY ANGIOPLASTY WITH STENT PLACEMENT  y/2013   "1; makes total of 3" (05/02/2012)  . CORONARY ARTERY BYPASS GRAFT  06/13/2012   Procedure: CORONARY ARTERY BYPASS GRAFTING (CABG);  Surgeon: Grace Isaac, MD;  Location: [place];  Service: Open Heart Surgery;  Laterality: N/A;  cabg x four;  using left internal mammary artery, and left leg greater saphenous vein harvested endoscopically  . CORONARY STENT INTERVENTION N/A 10/13/2016   Procedure: Coronary Stent Intervention;  Surgeon: Troy Sine, MD;  Location: [place] CV LAB;  Service: Cardiovascular;  Laterality: N/A;  . DIALYSIS/PERMA CATHETER REMOVAL N/A 04/18/2017   Procedure: DIALYSIS/PERMA CATHETER REMOVAL;  Surgeon: Katha Cabal, MD;  Location: [place] CV LAB;  Service: Cardiovascular;  Laterality: N/A;  . DILATION AND CURETTAGE OF UTERUS    . ESOPHAGOGASTRODUODENOSCOPY  01/20/2012   Procedure: ESOPHAGOGASTRODUODENOSCOPY (EGD);  Surgeon: Ladene Artist, MD,FACG;  Location: [facility] ENDOSCOPY;  Service: Endoscopy;  Laterality: N/A;  . ESOPHAGOGASTRODUODENOSCOPY N/A 03/26/2013   Procedure: ESOPHAGOGASTRODUODENOSCOPY (EGD);  Surgeon: Irene Shipper, MD;  Location: [facility] ENDOSCOPY;  Service: Endoscopy;  Laterality: N/A;  . ESOPHAGOGASTRODUODENOSCOPY N/A 04/30/2015   Procedure: ESOPHAGOGASTRODUODENOSCOPY (EGD);  Surgeon: Rogene Houston, MD;  [facility]O [facility];  Service: Endoscopy;  Laterality: N/A;  1pm - moved to 10/20 @ 1:10  . ESOPHAGOGASTRODUODENOSCOPY N/A 07/29/2016   Procedure: ESOPHAGOGASTRODUODENOSCOPY (EGD);  Surgeon: Manus Gunning, MD;  Location: [place];  Service: Gastroenterology;  Laterality: N/A;  enteroscopy  . INTRAOPERATIVE TRANSESOPHAGEAL ECHOCARDIOGRAM  06/13/2012   Procedure: INTRAOPERATIVE TRANSESOPHAGEAL ECHOCARDIOGRAM;  Surgeon: Grace Isaac, MD;  Location: [place];  Service: Open Heart Surgery;  Laterality: N/A;  . IR GENERIC HISTORICAL   07/26/2016   IR FLUORO GUIDE CV LINE RIGHT 07/26/2016 Greggory Keen, MD [facility]-INTERV RAD  . IR GENERIC HISTORICAL  07/26/2016   IR US GUIDE VASC ACCESS RIGHT 07/26/2016 Greggory Keen, MD [facility]  . IR GENERIC HISTORICAL  08/02/2016   IR US GUIDE VASC ACCESS RIGHT 08/02/2016 Greggory Keen, MD [facility]  . IR GENERIC HISTORICAL  08/02/2016   IR FLUORO GUIDE CV LINE RIGHT 08/02/2016 Greggory Keen, MD [facility]  . IR RADIOLOGY PERIPHERAL GUIDED IV START  03/28/2017  . IR US GUIDE VASC ACCESS RIGHT  03/28/2017  . LEFT HEART CATH AND CORONARY ANGIOGRAPHY N/A 09/20/2016   Procedure: Left Heart Cath and Coronary Angiography;  Surgeon: [name], MD;  Location: [place] CV LAB;  Service: Cardiovascular;  Laterality: N/A;  . LEFT HEART CATH AND CORS/GRAFTS ANGIOGRAPHY N/A 10/13/2016   Procedure: Left Heart Cath and Cors/Grafts Angiography;  Surgeon: Troy Sine, MD;  Location: [place] CV LAB;  Service: Cardiovascular;  Laterality: N/A;  . LEFT HEART CATHETERIZATION WITH CORONARY ANGIOGRAM N/A 12/15/2011   Procedure: LEFT HEART CATHETERIZATION WITH CORONARY ANGIOGRAM;  Surgeon: Burnell Blanks, MD;  Location: [facility] CATH LAB;  Service: Cardiovascular;  Laterality: N/A;  . LEFT HEART CATHETERIZATION WITH CORONARY ANGIOGRAM N/A 01/10/2012   Procedure: LEFT HEART CATHETERIZATION WITH CORONARY ANGIOGRAM;  Surgeon: Peter M Martinique, MD;  Location: [facility] CATH LAB;  Service: Cardiovascular;  Laterality: N/A;  . LEFT HEART CATHETERIZATION WITH CORONARY ANGIOGRAM N/A 06/08/2012   Procedure: LEFT HEART CATHETERIZATION WITH CORONARY ANGIOGRAM;  Surgeon: Burnell Blanks, MD;  Location: [facility] CATH LAB;  Service: Cardiovascular;  Laterality: N/A;  . LEFT HEART CATHETERIZATION WITH CORONARY/GRAFT ANGIOGRAM N/A 12/10/2013   Procedure: LEFT HEART CATHETERIZATION WITH Beatrix Fetters;  Surgeon: [name], MD;  Location: [facility] CATH LAB;  Service: Cardiovascular;  Laterality: N/A;  . OVARY SURGERY       ovarian cancer  . REVISION OF ARTERIOVENOUS GORETEX GRAFT N/A 02/24/2017   Procedure: REVISION OF ARTERIOVENOUS GORETEX GRAFT (RESECTION);  Surgeon: Katha Cabal, MD;  Location: [facility] ORS;  Service: Vascular;  Laterality: N/A;  Earney Mallet N/A 10/15/2013   Procedure: Fistulogram;  Surgeon: Serafina Mitchell, MD;  Location: [facility] CATH LAB;  Service: Cardiovascular;  Laterality: N/A;  . THROMBECTOMY / ARTERIOVENOUS GRAFT REVISION  2011   left upper arm  . TUBAL LIGATION  1980's  . UPPER EXTREMITY ANGIOGRAPHY Bilateral 12/06/2016   Procedure: Upper Extremity Angiography;  Surgeon: Katha Cabal, MD;  Location: [place] CV LAB;  Service: Cardiovascular;  Laterality: Bilateral;  . UPPER EXTREMITY INTERVENTION Left 06/06/2017   Procedure: UPPER EXTREMITY INTERVENTION;  Surgeon: Katha Cabal, MD;  Location: [place] CV LAB;  Service: Cardiovascular;  Laterality: Left;  OB History    Gravida  2   Para  2   Term      Preterm  2   AB      Living  2     SAB      TAB      Ectopic      Multiple      Live Births               Home Medications    Prior to Admission medications   Medication Sig Start Date End Date Taking? Authorizing Provider  ALPRAZolam (XANAX) 0.25 MG tablet Take 1 tablet (0.25 mg total) by mouth 2 (two) times daily as needed for anxiety or sleep. 04/05/17   Arrien, Jimmy Picket, MD  amiodarone (PACERONE) 200 MG tablet Take 1 tablet (200 mg total) by mouth daily. 12/08/16   Herminio Commons, MD  amiodarone (PACERONE) 200 MG tablet TAKE 1 TABLET BY MOUTH ONCE DAILY 09/20/17   Herminio Commons, MD  carvedilol (COREG) 6.25 MG tablet TAKE 1 TABLET BY MOUTH TWICE DAILY WITH  A  MEAL Patient taking differently: TAKE 1 TABLET (6.25 MG) BY MOUTH TWICE DAILY WITH  A  MEAL 06/15/17   Herminio Commons, MD  cinacalcet (SENSIPAR) 30 MG tablet Take 30 mg by mouth daily after supper.    [provider]  cloNIDine (CATAPRES) 0.2 MG  tablet Take 0.2 mg by mouth daily as needed (high blood pressure).     [provider]  diphenhydrAMINE (BENADRYL) 25 mg capsule Take 50 mg by mouth daily as needed for itching or allergies.     [provider]  diphenoxylate-atropine (LOMOTIL) 2.5-0.025 MG tablet Take 1 tablet by mouth 4 (four) times daily as needed for diarrhea or loose stools. 08/13/17   Tanna Furry, MD  epoetin alfa (EPOGEN,PROCRIT) 50093 UNIT/ML injection Inject 1 mL (10,000 Units total) into the vein every Monday, Wednesday, and Friday with hemodialysis. 04/11/16   Orvan Falconer, MD  ferrous sulfate 325 (65 FE) MG EC tablet Take 1 tablet (325 mg total) by mouth 2 (two) times daily with a meal. 07/15/17 08/14/17  Johnson, Clanford L, MD  fluticasone (FLONASE) 50 MCG/ACT nasal spray Place 1 spray at bedtime as needed into both nostrils for allergies.     [provider]  hydrALAZINE (APRESOLINE) 50 MG tablet Take 50 mg by mouth 2 (two) times daily as needed (high blood pressure).    [provider]  isosorbide mononitrate (IMDUR) 120 MG 24 hr tablet Take 1 tablet (120 mg total) by mouth daily. 05/11/17   Herminio Commons, MD  lidocaine-prilocaine (EMLA) cream Apply 1 application topically every Monday, Wednesday, and Friday. Prior to dialysis    [provider]  multivitamin (RENA-VIT) TABS tablet Take 1 tablet by mouth daily.    [provider]  nitroGLYCERIN (NITROSTAT) 0.4 MG SL tablet Place 1 tablet (0.4 mg total) under the tongue every 5 (five) minutes as needed for chest pain. 06/05/17   Herminio Commons, MD  nystatin-triamcinolone (MYCOLOG II) cream Apply 1 application topically 2 (two) times daily. Patient taking differently: Apply 1 application topically 2 (two) times daily as needed (itching/rash).  05/30/17   Rehman, Mechele Dawley, MD  omeprazole (PRILOSEC) 20 MG capsule Take 20 mg by mouth daily.    [provider]  ondansetron (ZOFRAN-ODT) 4 MG disintegrating tablet  Take 4 mg by mouth every 8 (eight) hours as needed for nausea or vomiting.  07/12/17  [provider]  sevelamer carbonate (RENVELA) 800 MG tablet Take 800-2,400 mg by mouth See admin instructions. Take 3 tablets (2400) by mouth twice daily with meals, take 1 tablet (800 mg) with snacks    [provider]  simvastatin (ZOCOR) 20 MG tablet TAKE ONE TABLET BY MOUTH AT BEDTIME Patient taking differently: TAKE ONE TABLET (20 MG) BY MOUTH DAILY AT BEDTIME 02/13/17   Herminio Commons, MD  ticagrelor (BRILINTA) 60 MG TABS tablet Take 1 tablet (60 mg total) by mouth 2 (two) times daily. 11/08/17   Herminio Commons, MD  ticagrelor (BRILINTA) 90 MG TABS tablet Take 1 tablet (90 mg total) by mouth 2 (two) times daily. 10/05/17   Herminio Commons, MD    Family History Family History  Problem Relation Age of Onset  . Heart disease Mother        Heart Disease before age 85  . Hyperlipidemia Mother   . Hypertension Mother   . Diabetes Mother   . Heart attack Mother   . Heart disease Father        Heart Disease before age 17  . Hyperlipidemia Father   . Hypertension Father   . Diabetes Father   . Diabetes Sister   . Hypertension Sister   . Diabetes Brother   . Hyperlipidemia Brother   . Heart attack Brother   . Hypertension Sister   . Heart attack Brother   . Other Unknown        noncontributory for early CAD  . Colon cancer Neg Hx   . Esophageal cancer Neg Hx   . Liver disease Neg Hx   . Kidney disease Neg Hx   . Colon polyps Neg Hx     Social History Social History   Tobacco Use  . Smoking status: Never Smoker  . Smokeless tobacco: Never Used  Substance Use Topics  . Alcohol use: No    Alcohol/week: 0.0 oz  . Drug use: No     Allergies   Aspirin; Penicillins; Amlodipine; Bactrim [sulfamethoxazole-trimethoprim]; Contrast media [iodinated diagnostic agents]; Iron; Nitrofurantoin; Tylenol [acetaminophen]; Gabapentin; Dexilant [dexlansoprazole]; Levaquin  [levofloxacin in d5w]; Morphine and related; Plavix [clopidogrel bisulfate]; Protonix [pantoprazole sodium]; and Venofer [ferric oxide]   Review of Systems Review of Systems  All other systems reviewed and are negative.    Physical Exam Updated Vital Signs BP (!) 194/61 (BP Location: Right Arm)   Pulse 60   Temp 98.7 F (37.1 C) (Oral)   Resp 16   Ht 5\' 1"  (1.549 m)   Wt 58.1 kg (128 lb)   SpO2 99%   BMI 24.19 kg/m   Physical Exam  Nursing note and vitals reviewed.  77 year old female, resting comfortably and in no acute distress. Vital signs are significant for elevated systolic blood pressure. Oxygen saturation is 99%, which is normal. Head is normocephalic and atraumatic. PERRLA, EOMI. Oropharynx is clear. Neck is nontender and supple without adenopathy or JVD. Back is nontender and there is no CVA tenderness. Lungs are clear without rales, wheezes, or rhonchi. Chest is nontender. Heart has regular rate and rhythm without murmur. Abdomen is soft, flat, nontender without masses or hepatosplenomegaly and peristalsis is normoactive. Extremities have 1+ edema, full range of motion is present.  AV fistula is present in the left upper arm with thrill present. Skin is warm and dry without rash. Neurologic: Mental status is normal, cranial nerves are intact, there are no motor or sensory deficits.  ED Treatments / Results  Labs (all labs ordered are listed, but only abnormal results are displayed) Labs Reviewed  BASIC METABOLIC PANEL - Abnormal; Notable for the following components:      Result Value   Sodium 131 (*)    Chloride 91 (*)    Glucose, Bld 111 (*)    BUN 33 (*)    Creatinine, Ser 6.90 (*)    GFR calc non Af Amer 5 (*)    GFR calc Af Amer 6 (*)    Anion gap 16 (*)    All other components within normal limits  CBC - Abnormal; Notable for the following components:   RBC 3.61 (*)    Hemoglobin 11.4 (*)    MCV 100.6 (*)    RDW 17.7 (*)    All other  components within normal limits  DIFFERENTIAL  I-STAT TROPONIN, ED    EKG EKG Interpretation  Date/Time:  Sunday October 15 2017 03:17:13 EDT Ventricular Rate:  60 PR Interval:    QRS Duration: 112 QT Interval:  461 QTC Calculation: 461 R Axis:   27 Text Interpretation:  Sinus rhythm Consider right atrial enlargement Left ventricular hypertrophy Anterior Q waves, possibly due to LVH Borderline T abnormalities, inferior leads Baseline wander in lead(s) II III aVL aVF When compared with ECG of 08/13/2017, No significant change was found Confirmed by Delora Fuel ([id number]) on 10/15/2017 3:26:17 AM   Radiology Dg Chest 2 View  Result Date: 10/15/2017 CLINICAL DATA:  Chest pain radiating to the left arm. EXAM: CHEST - 2 VIEW COMPARISON:  09/03/2017 FINDINGS: Postoperative changes in the mediastinum. Heart size and pulmonary vascularity are normal. Peribronchial thickening with diffuse interstitial pattern to the lungs likely represent chronic bronchitis. Linear scarring in the left mid lung and right lung base. No focal consolidation. No blunting of costophrenic angles. No pneumothorax. Mediastinal contours appear intact. Calcification of the aorta. IMPRESSION: Peribronchial thickening with diffuse interstitial pattern in the lungs likely represents chronic bronchitis. Scarring in the left mid lung and right lower lung. No focal consolidation. Aortic atherosclerosis. Electronically Signed   By: Lucienne Capers M.D.   On: 10/15/2017 04:05    Procedures Procedures  Medications Ordered in ED Medications - No data to display   Initial Impression / Assessment and Plan / ED Course  I have reviewed the triage vital signs and the nursing notes.  Pertinent labs & imaging results that were available during my care of the patient were reviewed by me and considered in my medical decision making (see chart for details).  Chest and left arm pain of uncertain cause.  Patient is an extremely poor historian  and is difficult to get reliable information regarding the pain, but the fact that it was partially relieved with nitroglycerin is concerning.  ECG shows no acute changes.  She will be given aspirin and nitroglycerin.  All records are reviewed, and she is followed by cardiology for extensive coronary artery disease and peripheral vascular disease.  She had non-STEMI in April 2018 treated with a drug-eluting stent.  She had complete relief of chest pain with above-noted treatment.  Initial troponin is normal.  Labs otherwise show mild anemia which is unchanged from baseline, and mild hyponatremia.  Chest x-ray shows no evidence of pneumonia.  Given her history and response to nitroglycerin, I feel she needs to be admitted for serial troponins.  Case is discussed with Dr. Hal Hope of [facility], who agrees to admit the patient.  Final Clinical Impressions(s) / ED Diagnoses   Final  diagnoses:  Chest pain, unspecified type  End-stage renal disease on hemodialysis ([place])  Anemia associated with chronic renal failure  Hyponatremia    ED Discharge Orders    None       Delora Fuel, MD 36/92/23 [phone]

## 2017-10-15 NOTE — H&P (Signed)
History and Physical    Shawna [name]:[id number] DOB: 25-Aug-1940 DOA: 10/15/2017  PCP: [name], MD   I have briefly reviewed patients previous medical reports in [facility].  Patient coming from: Home  Chief Complaint: Chest pain  HPI: Shawna [name] is a 77 year old woman with a extensive medical history including: Anxiety, coronary artery disease a status post CABG, carotid artery stenosis, chronic diastolic heart failure, GERD, gastroesophageal reflux with esophageal stricture (status post dilatation), end-stage renal disease on hemodialysis, hypertension and hyperlipidemia; who presented to the emergency department secondary to chest discomfort.  Patient reported chest localized in the left aspect of her upper torso, with a sudden onset while resting, radiated to the left shoulder/arm and neck.  The patient lasted approximately 40-45 minutes, improved with the use of aspirin and nitroglycerin taken at home, mild shortness of breath associated with chest discomfort, no nausea, no vomiting, no diaphoresis, no abdominal pain. Patient also expressed no melena, no hematochezia, no fever, no chills, no focal weakness or any other acute complaints.  She is currently chest pain-free.  ED Course: Troponin x1-, EKG without acute ischemic changes, aspirin given and patient prior to presenting to the ED took also some nitroglycerin at home.  TRH has been called to place the patient in observation for ACS rule out.  Review of Systems:  All other systems reviewed and apart from HPI, are negative.  Past Medical History:  Diagnosis Date  . Anxiety   . Arthritis   . AVM (arteriovenous malformation) of colon   . CAD (coronary artery disease)    a. 12/2011 NSTEMI/Cath/PCI LCX (2.25x14 Resolute DES) & D1 (2.25x22 Resolute DES);  b. 01/2012 Cath/PCI: LM 30, LAD 30p, 40-65m, D1 stent ok, 99 in sm branch of diag, LCX patent stent, OM1 20, RCA 95 ost (4.0x12 Promus DES), EF 55%;  c. 04/2012 Lexi  Cardiolite  EF 48%, small area of scar @ base/mid inflat wall with mild peri-infarct ischemia.; CABG 12/4  . Carotid artery disease ([place])    a. 38-18% LICA, 08/9935   . Chronic bronchitis ([place])   . Chronic diastolic CHF (congestive heart failure) ([place])    a. 02/2012 Echo EF 60-65%, nl wall motion, Gr 1 DD, mod MR  . Colon cancer ([place]) 1992  . Esophageal stricture   . ESRD on hemodialysis ([place])    ESRD due to HTN, started dialysis 2011 and gets HD at [facility] with Dr Hinda Lenis on MWF schedule.  Access is LUA AVF as of Sept 2014.   Marland Kitchen GERD (gastroesophageal reflux disease)   . High cholesterol 12/2011  . History of blood transfusion 07/2011; 12/2011; 01/2012 X 2; 04/2012  . History of gout   . History of lower GI bleeding   . Hypertension   . Iron deficiency anemia   . Mitral regurgitation    a. Moderate by echo, 02/2012  . Myocardial infarction ([place])   . Ovarian cancer ([place]) 1992  . Pneumonia ~ 2009  . PUD (peptic ulcer disease)   . TIA (transient ischemic attack)     Past Surgical History:  Procedure Laterality Date  . ABDOMINAL HYSTERECTOMY  1992  . APPENDECTOMY  06/1990  . AV FISTULA PLACEMENT  07/2009   left upper arm  . AV FISTULA PLACEMENT Right 09/06/2016   Procedure: RIGHT FOREARM ARTERIOVENOUS (AV) GRAFT;  Surgeon: Elam Dutch, MD;  Location: [place];  Service: Vascular;  Laterality: Right;  . AV FISTULA PLACEMENT N/A 02/24/2017   Procedure: INSERTION  OF ARTERIOVENOUS (AV) GORE-TEX GRAFT ARM (BRACHIAL AXILLARY);  Surgeon: Katha Cabal, MD;  Location: [facility] ORS;  Service: Vascular;  Laterality: N/A;  . [place] Right 09/06/2016   Procedure: REMOVAL OF Right Arm ARTERIOVENOUS GORETEX GRAFT and Vein Patch angioplasty of brachial artery;  Surgeon: Angelia Mould, MD;  Location: [place];  Service: Vascular;  Laterality: Right;  . COLON RESECTION  1992  . COLON SURGERY    . CORONARY ANGIOPLASTY WITH STENT PLACEMENT  12/15/11   "2"  . CORONARY ANGIOPLASTY WITH STENT  PLACEMENT  y/2013   "1; makes total of 3" (05/02/2012)  . CORONARY ARTERY BYPASS GRAFT  06/13/2012   Procedure: CORONARY ARTERY BYPASS GRAFTING (CABG);  Surgeon: Grace Isaac, MD;  Location: [place];  Service: Open Heart Surgery;  Laterality: N/A;  cabg x four;  using left internal mammary artery, and left leg greater saphenous vein harvested endoscopically  . CORONARY STENT INTERVENTION N/A 10/13/2016   Procedure: Coronary Stent Intervention;  Surgeon: Troy Sine, MD;  Location: [place] CV LAB;  Service: Cardiovascular;  Laterality: N/A;  . DIALYSIS/PERMA CATHETER REMOVAL N/A 04/18/2017   Procedure: DIALYSIS/PERMA CATHETER REMOVAL;  Surgeon: Katha Cabal, MD;  Location: [place] CV LAB;  Service: Cardiovascular;  Laterality: N/A;  . DILATION AND CURETTAGE OF UTERUS    . ESOPHAGOGASTRODUODENOSCOPY  01/20/2012   Procedure: ESOPHAGOGASTRODUODENOSCOPY (EGD);  Surgeon: Ladene Artist, MD,FACG;  Location: [facility] ENDOSCOPY;  Service: Endoscopy;  Laterality: N/A;  . ESOPHAGOGASTRODUODENOSCOPY N/A 03/26/2013   Procedure: ESOPHAGOGASTRODUODENOSCOPY (EGD);  Surgeon: Irene Shipper, MD;  Location: [facility] ENDOSCOPY;  Service: Endoscopy;  Laterality: N/A;  . ESOPHAGOGASTRODUODENOSCOPY N/A 04/30/2015   Procedure: ESOPHAGOGASTRODUODENOSCOPY (EGD);  Surgeon: Rogene Houston, MD;  [facility];  Service: Endoscopy;  Laterality: N/A;  1pm - moved to 10/20 @ 1:10  . ESOPHAGOGASTRODUODENOSCOPY N/A 07/29/2016   Procedure: ESOPHAGOGASTRODUODENOSCOPY (EGD);  Surgeon: Manus Gunning, MD;  Location: [place];  Service: Gastroenterology;  Laterality: N/A;  enteroscopy  . INTRAOPERATIVE TRANSESOPHAGEAL ECHOCARDIOGRAM  06/13/2012   Procedure: INTRAOPERATIVE TRANSESOPHAGEAL ECHOCARDIOGRAM;  Surgeon: Grace Isaac, MD;  Location: [place];  Service: Open Heart Surgery;  Laterality: N/A;  . IR GENERIC HISTORICAL  07/26/2016   IR FLUORO GUIDE CV LINE RIGHT 07/26/2016 Greggory Keen, MD [facility]    . IR GENERIC HISTORICAL  07/26/2016   IR US GUIDE VASC ACCESS RIGHT 07/26/2016 Greggory Keen, MD [facility]  . IR GENERIC HISTORICAL  08/02/2016   IR US GUIDE VASC ACCESS RIGHT 08/02/2016 Greggory Keen, MD [facility]  . IR GENERIC HISTORICAL  08/02/2016   IR FLUORO GUIDE CV LINE RIGHT 08/02/2016 Greggory Keen, MD [facility]  . IR RADIOLOGY PERIPHERAL GUIDED IV START  03/28/2017  . IR US GUIDE VASC ACCESS RIGHT  03/28/2017  . LEFT HEART CATH AND CORONARY ANGIOGRAPHY N/A 09/20/2016   Procedure: Left Heart Cath and Coronary Angiography;  Surgeon: [name], MD;  Location: [place] CV LAB;  Service: Cardiovascular;  Laterality: N/A;  . LEFT HEART CATH AND CORS/GRAFTS ANGIOGRAPHY N/A 10/13/2016   Procedure: Left Heart Cath and Cors/Grafts Angiography;  Surgeon: Troy Sine, MD;  Location: [place] CV LAB;  Service: Cardiovascular;  Laterality: N/A;  . LEFT HEART CATHETERIZATION WITH CORONARY ANGIOGRAM N/A 12/15/2011   Procedure: LEFT HEART CATHETERIZATION WITH CORONARY ANGIOGRAM;  Surgeon: Burnell Blanks, MD;  Location: [facility] CATH LAB;  Service: Cardiovascular;  Laterality: N/A;  . LEFT HEART CATHETERIZATION WITH CORONARY ANGIOGRAM N/A 01/10/2012  Procedure: LEFT HEART CATHETERIZATION WITH CORONARY ANGIOGRAM;  Surgeon: Peter M Martinique, MD;  Location: [facility] CATH LAB;  Service: Cardiovascular;  Laterality: N/A;  . LEFT HEART CATHETERIZATION WITH CORONARY ANGIOGRAM N/A 06/08/2012   Procedure: LEFT HEART CATHETERIZATION WITH CORONARY ANGIOGRAM;  Surgeon: Burnell Blanks, MD;  Location: [facility] CATH LAB;  Service: Cardiovascular;  Laterality: N/A;  . LEFT HEART CATHETERIZATION WITH CORONARY/GRAFT ANGIOGRAM N/A 12/10/2013   Procedure: LEFT HEART CATHETERIZATION WITH Beatrix Fetters;  Surgeon: [name], MD;  Location: [facility] CATH LAB;  Service: Cardiovascular;  Laterality: N/A;  . OVARY SURGERY     ovarian cancer  . REVISION OF ARTERIOVENOUS GORETEX GRAFT N/A 02/24/2017    Procedure: REVISION OF ARTERIOVENOUS GORETEX GRAFT (RESECTION);  Surgeon: Katha Cabal, MD;  Location: [facility] ORS;  Service: Vascular;  Laterality: N/A;  Earney Mallet N/A 10/15/2013   Procedure: Fistulogram;  Surgeon: Serafina Mitchell, MD;  Location: [facility] CATH LAB;  Service: Cardiovascular;  Laterality: N/A;  . THROMBECTOMY / ARTERIOVENOUS GRAFT REVISION  2011   left upper arm  . TUBAL LIGATION  1980's  . UPPER EXTREMITY ANGIOGRAPHY Bilateral 12/06/2016   Procedure: Upper Extremity Angiography;  Surgeon: Katha Cabal, MD;  Location: [place] CV LAB;  Service: Cardiovascular;  Laterality: Bilateral;  . UPPER EXTREMITY INTERVENTION Left 06/06/2017   Procedure: UPPER EXTREMITY INTERVENTION;  Surgeon: Katha Cabal, MD;  Location: [place] CV LAB;  Service: Cardiovascular;  Laterality: Left;    Social History  reports that she has never smoked. She has never used smokeless tobacco. She reports that she does not drink alcohol or use drugs.  Allergies  Allergen Reactions  . Aspirin Other (See Comments)    High Doses Mess up her stomach; "makes my bowels have blood in them". Takes 81 mg EC Aspirin   . Penicillins Other (See Comments)    SYNCOPE? , "makes me real weak when I take it; like I'll pass out"  Has patient had a PCN reaction causing immediate rash, facial/tongue/throat swelling, SOB or lightheadedness with hypotension: Yes Has patient had a PCN reaction causing severe rash involving mucus membranes or skin necrosis: no Has patient had a PCN reaction that required hospitalization no Has patient had a PCN reaction occurring within the last 10 years: no If all of the above  . Amlodipine Swelling  . Bactrim [Sulfamethoxazole-Trimethoprim] Rash  . Contrast Media [Iodinated Diagnostic Agents] Itching  . Iron Itching and Other (See Comments)    "they gave me iron in dialysis; had to give me Benadryl cause I had to have the iron" (05/02/2012)  . Nitrofurantoin Hives  .  Tylenol [Acetaminophen] Itching and Other (See Comments)    Makes her feet on fire per pt  . Gabapentin Other (See Comments)    Unknown reaction  . Dexilant [Dexlansoprazole] Other (See Comments)    Upset stomach  . Levaquin [Levofloxacin In D5w] Rash  . Morphine And Related Itching and Other (See Comments)    Itching in feet  . Plavix [Clopidogrel Bisulfate] Rash  . Protonix [Pantoprazole Sodium] Rash  . Venofer [Ferric Oxide] Itching and Other (See Comments)    Patient reports using Benadryl prior to doses as [facility]    Family History  Problem Relation Age of Onset  . Heart disease Mother        Heart Disease before age 26  . Hyperlipidemia Mother   . Hypertension Mother   . Diabetes Mother   . Heart attack Mother   .  Heart disease Father        Heart Disease before age 53  . Hyperlipidemia Father   . Hypertension Father   . Diabetes Father   . Diabetes Sister   . Hypertension Sister   . Diabetes Brother   . Hyperlipidemia Brother   . Heart attack Brother   . Hypertension Sister   . Heart attack Brother   . Other Unknown        noncontributory for early CAD  . Colon cancer Neg Hx   . Esophageal cancer Neg Hx   . Liver disease Neg Hx   . Kidney disease Neg Hx   . Colon polyps Neg Hx      Prior to Admission medications   Medication Sig Start Date End Date Taking? Authorizing Provider  ALPRAZolam (XANAX) 0.25 MG tablet Take 1 tablet (0.25 mg total) by mouth 2 (two) times daily as needed for anxiety or sleep. 04/05/17  Yes Arrien, Jimmy Picket, MD  amiodarone (PACERONE) 200 MG tablet Take 1 tablet (200 mg total) by mouth daily. 12/08/16  Yes Herminio Commons, MD  carvedilol (COREG) 6.25 MG tablet TAKE 1 TABLET BY MOUTH TWICE DAILY WITH  A  MEAL Patient taking differently: TAKE 1 TABLET (6.25 MG) BY MOUTH TWICE DAILY WITH  A  MEAL 06/15/17  Yes Herminio Commons, MD  cinacalcet (SENSIPAR) 30 MG tablet Take 30 mg by mouth daily after supper.   Yes  [provider]  cloNIDine (CATAPRES) 0.2 MG tablet Take 0.2 mg by mouth daily as needed (high blood pressure).    Yes [provider]  diphenhydrAMINE (BENADRYL) 25 mg capsule Take 50 mg by mouth daily as needed for itching or allergies.    Yes [provider]  diphenoxylate-atropine (LOMOTIL) 2.5-0.025 MG tablet Take 1 tablet by mouth 4 (four) times daily as needed for diarrhea or loose stools. 08/13/17  Yes Tanna Furry, MD  epoetin alfa (EPOGEN,PROCRIT) 16109 UNIT/ML injection Inject 1 mL (10,000 Units total) into the vein every Monday, Wednesday, and Friday with hemodialysis. 04/11/16  Yes Orvan Falconer, MD  ferrous sulfate 325 (65 FE) MG EC tablet Take 1 tablet (325 mg total) by mouth 2 (two) times daily with a meal. 07/15/17 10/15/17 Yes Johnson, Clanford L, MD  fluticasone (FLONASE) 50 MCG/ACT nasal spray Place 1 spray at bedtime as needed into both nostrils for allergies.    Yes [provider]  isosorbide mononitrate (IMDUR) 120 MG 24 hr tablet Take 1 tablet (120 mg total) by mouth daily. 05/11/17  Yes Herminio Commons, MD  lidocaine-prilocaine (EMLA) cream Apply 1 application topically every Monday, Wednesday, and Friday. Prior to dialysis   Yes [provider]  multivitamin (RENA-VIT) TABS tablet Take 1 tablet by mouth daily.   Yes [provider]  nitroGLYCERIN (NITROSTAT) 0.4 MG SL tablet Place 1 tablet (0.4 mg total) under the tongue every 5 (five) minutes as needed for chest pain. 06/05/17  Yes Herminio Commons, MD  nystatin-triamcinolone (MYCOLOG II) cream Apply 1 application topically 2 (two) times daily. Patient taking differently: Apply 1 application topically 2 (two) times daily as needed (itching/rash).  05/30/17  Yes Rehman, Mechele Dawley, MD  omeprazole (PRILOSEC) 20 MG capsule Take 20 mg by mouth daily.   Yes [provider]  ondansetron (ZOFRAN-ODT) 4 MG disintegrating tablet Take 4 mg by mouth every 8 (eight) hours as  needed for nausea or vomiting.  07/12/17  Yes [provider]  sevelamer carbonate (RENVELA) 800 MG  tablet Take 800-2,400 mg by mouth See admin instructions. Take 3 tablets (2400) by mouth twice daily with meals, take 1 tablet (800 mg) with snacks   Yes [provider]  simvastatin (ZOCOR) 20 MG tablet TAKE ONE TABLET BY MOUTH AT BEDTIME Patient taking differently: TAKE ONE TABLET (20 MG) BY MOUTH DAILY AT BEDTIME 02/13/17  Yes Herminio Commons, MD  ticagrelor (BRILINTA) 60 MG TABS tablet Take 1 tablet (60 mg total) by mouth 2 (two) times daily. 11/08/17  Yes Herminio Commons, MD  ticagrelor (BRILINTA) 90 MG TABS tablet Take 1 tablet (90 mg total) by mouth 2 (two) times daily. 10/05/17  Yes Herminio Commons, MD  hydrALAZINE (APRESOLINE) 50 MG tablet Take 50 mg by mouth 2 (two) times daily as needed (high blood pressure).    [provider]    Physical Exam: Vitals:   10/15/17 0600 10/15/17 0630 10/15/17 0700 10/15/17 0803  BP: (!) 177/68 (!) 153/44 (!) 151/60 (!) 197/54  Pulse: (!) 56 (!) 54  (!) 58  Resp: 14 16 14 18   Temp:    97.7 F (36.5 C)  TempSrc:    Oral  SpO2: 98% 96%  98%  Weight:    59.5 kg (131 lb 2.8 oz)  Height:    5\' 1"  (1.549 m)   Constitutional: Patient currently chest pain-free and no reported shortness of breath.  She is in no acute distress and express no nausea, no vomiting, no diarrhea. Eyes: PERTLA, lids and conjunctivae normal, no icterus, no nystagmus. ENMT: Mucous membranes are moist. Posterior pharynx clear of any exudate or lesions. No thrush, good dentition. Neck: supple, no masses, no thyromegaly, no JVD Respiratory: clear to auscultation bilaterally, no wheezing, no crackles. Normal respiratory effort. No accessory muscle use.  Cardiovascular: S1 & S2 heard, regular rate and rhythm, no murmurs / rubs / gallops. No extremity edema. 2+ pedal pulses. No carotid bruits.  Abdomen: No distension, no tenderness, no masses palpated.  No hepatosplenomegaly. Bowel sounds normal.  Musculoskeletal: no clubbing / cyanosis. No joint deformity upper and lower extremities. Good ROM, no contractures. Normal muscle tone.  Skin: no rashes, open ulcers or induration.  Left upper arm with hemodialysis fistula in place, good thrill appreciated. Neurologic: CN 2-12 grossly intact. Sensation intact, DTR normal. Strength 5/5 in all 4 limbs.  Psychiatric: Normal judgment and insight. Alert and oriented x 3. Normal mood.     Labs on Admission: I have personally reviewed following labs and imaging studies  CBC: Recent Labs  Lab 10/15/17 0420  WBC 8.1  NEUTROABS 6.3  HGB 11.4*  HCT 36.3  MCV 100.6*  PLT PLATELETS APPEAR ADEQUATE   Basic Metabolic Panel: Recent Labs  Lab 10/15/17 0420  NA 131*  K 3.7  CL 91*  CO2 24  GLUCOSE 111*  BUN 33*  CREATININE 6.90*  CALCIUM 9.6   Urine analysis:    Component Value Date/Time   COLORURINE YELLOW 12/16/2012 1919   APPEARANCEUR CLOUDY (A) 12/16/2012 1919   LABSPEC 1.009 12/16/2012 1919   PHURINE 7.5 12/16/2012 1919   GLUCOSEU NEGATIVE 12/16/2012 1919   HGBUR TRACE (A) 12/16/2012 1919   BILIRUBINUR NEGATIVE 12/16/2012 1919   KETONESUR NEGATIVE 12/16/2012 1919   PROTEINUR 100 (A) 12/16/2012 1919   UROBILINOGEN 0.2 12/16/2012 1919   NITRITE NEGATIVE 12/16/2012 1919   LEUKOCYTESUR SMALL (A) 12/16/2012 1919     Radiological Exams on Admission: Dg Chest 2 View  Result Date: 10/15/2017 CLINICAL DATA:  Chest pain radiating to  the left arm. EXAM: CHEST - 2 VIEW COMPARISON:  09/03/2017 FINDINGS: Postoperative changes in the mediastinum. Heart size and pulmonary vascularity are normal. Peribronchial thickening with diffuse interstitial pattern to the lungs likely represent chronic bronchitis. Linear scarring in the left mid lung and right lung base. No focal consolidation. No blunting of costophrenic angles. No pneumothorax. Mediastinal contours appear intact. Calcification of the aorta.  IMPRESSION: Peribronchial thickening with diffuse interstitial pattern in the lungs likely represents chronic bronchitis. Scarring in the left mid lung and right lower lung. No focal consolidation. Aortic atherosclerosis. Electronically Signed   By: Lucienne Capers M.D.   On: 10/15/2017 04:05    EKG:  Sinus rhythm, normal axis, no acute ischemic changes appreciated.  Assessment/Plan 1-chest pain: Patient with risk factors including prior coronary artery disease a status post CABG, hyperlipidemia, hypertension.  Heart score 5. -will place on telemetry -Cycle troponin and EKG -Continue beta-blocker, aspirin, nitrates and statins  -Continue Brilinta -Will check 2D echo and ask cardiology inputs to determine the need of further evaluation while inpatient.  2-ESRD on hemodialysis [facility]) -Chronically on hemodialysis (Monday, Wednesday and Friday) -Case discussed with nephrology service for continuation of dialysis treatment. -No signs of fluid overload on exam -Stable electrolytes currently.  3-HLD (hyperlipidemia) -Continue Zocor -will check lipid panel   4-GERD (gastroesophageal reflux disease) -Continue PPI  5-Anemia of chronic kidney failure no signs of acute bleeding -A stable hemoglobin -Patient receiving -Epogen as per renal service discretion every Monday with hemodialysis -nephrology to decide on further needs -Hgb 11.4  6-Secondary hyperparathyroidism ([place]) -will continue the use of Sensipar and Renvela -follow electrolytes trend   7-Chronic diastolic CHF (congestive heart failure) (HCC) -Currently compensated -Last ejection fraction 50-55% and a grade 1 diastolic dysfunction -Follow daily weights and strict intake and output -Echo will be repeated as part of ACS workup.  8-essential hypertension  -Overall stable -Will continue current antihypertensive regimen (Coreg 6.25 mg twice a day, Imdur 120mg  daily and PRN hydralazine) -heart healthy diet ordered    9-history of supraventricular tachycardia -Appears to be stable and well-controlled -Continue the use of amiodarone.  10-anxiety: -Mood is a stable and well-controlled currently -Continue as needed xanax  Time: 70 minutes.   DVT prophylaxis: Heparin Code Status: Full code Family Communication: Husband at bedside Disposition Plan: Anticipate discharge back home once ACS rule out. Consults called: Nephrology service, cardiology service. Admission status: Observation, telemetry bed, LOS more than 2 midnights.   Barton Dubois MD [facility] Pager [phone]  If 7PM-7AM, please contact night-coverage [email or web address] Password [facility]  10/15/2017, 9:49 AM

## 2017-10-15 NOTE — ED Notes (Signed)
Third nurse not able to obtain IV access.  Dr Roxanne Mins informed.

## 2017-10-15 NOTE — Progress Notes (Signed)
In bed, alert and oriented .  Denies chest pain.  Contacted Dr. Dyann Kief for diet order.  Husband at bedside

## 2017-10-15 NOTE — ED Notes (Signed)
2 unsuccessful attempts made by 2 nurses.  Pt says she is a hard stick.

## 2017-10-15 NOTE — ED Notes (Signed)
Forth nurse in to attempt IV, without success.  Pt refuses IJ/EJ via MD.

## 2017-10-15 NOTE — ED Triage Notes (Signed)
Pt states she went to go to bed around 0100 when she started having chestpain that radiated down left arm. Pt states she took one baby ASA and 2 sl NTG which relieved the pain in her chest but she still has pain in L. Arm and neck.

## 2017-10-16 ENCOUNTER — Observation Stay (HOSPITAL_BASED_OUTPATIENT_CLINIC_OR_DEPARTMENT_OTHER): Payer: Medicare HMO

## 2017-10-16 DIAGNOSIS — E785 Hyperlipidemia, unspecified: Secondary | ICD-10-CM | POA: Diagnosis not present

## 2017-10-16 DIAGNOSIS — N186 End stage renal disease: Secondary | ICD-10-CM | POA: Diagnosis not present

## 2017-10-16 DIAGNOSIS — D649 Anemia, unspecified: Secondary | ICD-10-CM | POA: Diagnosis not present

## 2017-10-16 DIAGNOSIS — Z992 Dependence on renal dialysis: Secondary | ICD-10-CM | POA: Diagnosis not present

## 2017-10-16 DIAGNOSIS — I25119 Atherosclerotic heart disease of native coronary artery with unspecified angina pectoris: Secondary | ICD-10-CM

## 2017-10-16 DIAGNOSIS — I361 Nonrheumatic tricuspid (valve) insufficiency: Secondary | ICD-10-CM

## 2017-10-16 DIAGNOSIS — I200 Unstable angina: Secondary | ICD-10-CM

## 2017-10-16 DIAGNOSIS — I5032 Chronic diastolic (congestive) heart failure: Secondary | ICD-10-CM | POA: Diagnosis not present

## 2017-10-16 DIAGNOSIS — N185 Chronic kidney disease, stage 5: Secondary | ICD-10-CM

## 2017-10-16 DIAGNOSIS — E871 Hypo-osmolality and hyponatremia: Secondary | ICD-10-CM | POA: Diagnosis not present

## 2017-10-16 DIAGNOSIS — R0789 Other chest pain: Secondary | ICD-10-CM

## 2017-10-16 DIAGNOSIS — R079 Chest pain, unspecified: Secondary | ICD-10-CM | POA: Diagnosis not present

## 2017-10-16 DIAGNOSIS — I132 Hypertensive heart and chronic kidney disease with heart failure and with stage 5 chronic kidney disease, or end stage renal disease: Secondary | ICD-10-CM | POA: Diagnosis not present

## 2017-10-16 DIAGNOSIS — I2510 Atherosclerotic heart disease of native coronary artery without angina pectoris: Secondary | ICD-10-CM | POA: Diagnosis not present

## 2017-10-16 DIAGNOSIS — D631 Anemia in chronic kidney disease: Secondary | ICD-10-CM

## 2017-10-16 DIAGNOSIS — I471 Supraventricular tachycardia: Secondary | ICD-10-CM | POA: Diagnosis not present

## 2017-10-16 DIAGNOSIS — I10 Essential (primary) hypertension: Secondary | ICD-10-CM | POA: Diagnosis not present

## 2017-10-16 DIAGNOSIS — I480 Paroxysmal atrial fibrillation: Secondary | ICD-10-CM | POA: Diagnosis not present

## 2017-10-16 LAB — LIPID PANEL
Cholesterol: 93 mg/dL (ref 0–200)
HDL: 56 mg/dL (ref >40)
LDL CALC: 26 mg/dL (ref 0–99)
TRIGLYCERIDES: 56 mg/dL (ref <150)
Total CHOL/HDL Ratio: 1.7 RATIO
VLDL: 11 mg/dL (ref 0–40)

## 2017-10-16 LAB — RENAL FUNCTION PANEL
ANION GAP: 17 — AB (ref 5–15)
Albumin: 3.2 g/dL — ABNORMAL LOW (ref 3.5–5.0)
BUN: 51 mg/dL — ABNORMAL HIGH (ref 6–20)
CHLORIDE: 92 mmol/L — AB (ref 101–111)
CO2: 24 mmol/L (ref 22–32)
Calcium: 9.8 mg/dL (ref 8.9–10.3)
Creatinine, Ser: 8.94 mg/dL — ABNORMAL HIGH (ref 0.44–1.00)
GFR calc Af Amer: 4 mL/min — ABNORMAL LOW (ref >60)
GFR calc non Af Amer: 4 mL/min — ABNORMAL LOW (ref >60)
Glucose, Bld: 97 mg/dL (ref 65–99)
POTASSIUM: 4.3 mmol/L (ref 3.5–5.1)
Phosphorus: 4.1 mg/dL (ref 2.5–4.6)
Sodium: 133 mmol/L — ABNORMAL LOW (ref 135–145)

## 2017-10-16 LAB — CBC
HCT: 36 % (ref 36.0–46.0)
HEMOGLOBIN: 11.2 g/dL — AB (ref 12.0–15.0)
MCH: 31.2 pg (ref 26.0–34.0)
MCHC: 31.1 g/dL (ref 30.0–36.0)
MCV: 100.3 fL — AB (ref 78.0–100.0)
Platelets: 261 10*3/uL (ref 150–400)
RBC: 3.59 MIL/uL — AB (ref 3.87–5.11)
RDW: 18.1 % — ABNORMAL HIGH (ref 11.5–15.5)
WBC: 6.8 10*3/uL (ref 4.0–10.5)

## 2017-10-16 LAB — ECHOCARDIOGRAM COMPLETE
HEIGHTINCHES: 61 in
Weight: 2091.72 oz

## 2017-10-16 MED ORDER — LIDOCAINE HCL (PF) 1 % IJ SOLN: 5.0000 mL | INTRAMUSCULAR | Status: DC | PRN

## 2017-10-16 MED ORDER — PENTAFLUOROPROP-TETRAFLUOROETH EX AERO: 1.0000 "application " | INHALATION_SPRAY | CUTANEOUS | Status: DC | PRN

## 2017-10-16 MED ORDER — GUAIFENESIN 100 MG/5ML PO SOLN: 5.0000 mL | Freq: Four times a day (QID) | ORAL | 0 refills | Status: DC | PRN

## 2017-10-16 MED ORDER — SODIUM CHLORIDE 0.9 % IV SOLN: 100.0000 mL | INTRAVENOUS | Status: DC | PRN

## 2017-10-16 MED ORDER — HYDRALAZINE HCL 25 MG PO TABS: 50.0000 mg | ORAL_TABLET | Freq: Two times a day (BID) | ORAL | Status: DC

## 2017-10-16 MED ORDER — GUAIFENESIN 100 MG/5ML PO SOLN
5.0000 mL | ORAL | Status: DC | PRN
  Administered 2017-10-16: 100 mg via ORAL
  Filled 2017-10-16: qty 5

## 2017-10-16 MED ORDER — LIDOCAINE-PRILOCAINE 2.5-2.5 % EX CREA: 1.0000 "application " | TOPICAL_CREAM | CUTANEOUS | Status: DC | PRN

## 2017-10-16 MED ORDER — CLONIDINE HCL 0.2 MG PO TABS
0.2000 mg | ORAL_TABLET | Freq: Once | ORAL | Status: AC
  Administered 2017-10-16: 0.2 mg via ORAL
  Filled 2017-10-16: qty 1

## 2017-10-16 MED ORDER — CLONIDINE HCL 0.2 MG PO TABS: 0.2000 mg | ORAL_TABLET | Freq: Two times a day (BID) | ORAL | Status: DC

## 2017-10-16 NOTE — Progress Notes (Signed)
Subjective: Interval History: has no complaint of chest pain.  She states that she had some difficulty breathing last night with the stopping of the nose.  She is feeling somewhat better this morning.  She did not have any nausea or vomiting..  Objective: Vital signs in last 24 hours: Temp:  [98.2 F (36.8 C)-99.2 F (37.3 C)] 98.2 F (36.8 C) (04/08 0500) Pulse Rate:  [56-71] 58 (04/08 0900) Resp:  [18-20] 19 (04/08 0500) BP: (155-200)/(52-127) 169/57 (04/08 0900) SpO2:  [92 %-100 %] 97 % (04/08 0500) Weight:  [59.3 kg (130 lb 11.7 oz)] 59.3 kg (130 lb 11.7 oz) (04/08 0500) Weight change: 1.44 kg (3 lb 2.8 oz)  Intake/Output from previous day: 04/07 0701 - 04/08 0700 In: 300 [P.O.:300] Out: -  Intake/Output this shift: No intake/output data recorded.  General appearance: alert, cooperative and no distress Resp: diminished breath sounds bilaterally Cardio: regular rate and rhythm Extremities: No edema  Lab Results: Recent Labs    10/15/17 0420 10/16/17 0539  WBC 8.1 6.8  HGB 11.4* 11.2*  HCT 36.3 36.0  PLT PLATELETS APPEAR ADEQUATE 261   BMET:  Recent Labs    10/15/17 0420 10/16/17 0539  NA 131* 133*  K 3.7 4.3  CL 91* 92*  CO2 24 24  GLUCOSE 111* 97  BUN 33* 51*  CREATININE 6.90* 8.94*  CALCIUM 9.6 9.8   No results for input(s): PTH in the last 72 hours. Iron Studies: No results for input(s): IRON, TIBC, TRANSFERRIN, FERRITIN in the last 72 hours.  Studies/Results: Dg Chest 2 View  Result Date: 10/15/2017 CLINICAL DATA:  Chest pain radiating to the left arm. EXAM: CHEST - 2 VIEW COMPARISON:  09/03/2017 FINDINGS: Postoperative changes in the mediastinum. Heart size and pulmonary vascularity are normal. Peribronchial thickening with diffuse interstitial pattern to the lungs likely represent chronic bronchitis. Linear scarring in the left mid lung and right lung base. No focal consolidation. No blunting of costophrenic angles. No pneumothorax. Mediastinal  contours appear intact. Calcification of the aorta. IMPRESSION: Peribronchial thickening with diffuse interstitial pattern in the lungs likely represents chronic bronchitis. Scarring in the left mid lung and right lower lung. No focal consolidation. Aortic atherosclerosis. Electronically Signed   By: Lucienne Capers M.D.   On: 10/15/2017 04:05    I have reviewed the patient's current medications.  Assessment/Plan: Chest pain: Has improved. 2] history of difficulty breathing last night.  Presently she is feeling better.  Patient seems to have possible bronchitis.   3] end-stage renal disease: Her potassium is normal.  She is due for dialysis today 4] hypertension: Her blood pressure is better controlled 5] anemia: Her hemoglobin is within her target goal 6] bone and mineral disorder: Her calcium and phosphorus is in range. Plan: 1] we will dialyze patient today 2] we will try to remove about 3 L if blood pressure tolerates.    LOS: 0 days   Shawna [name] S 10/16/2017,10:01 AM

## 2017-10-16 NOTE — Discharge Summary (Addendum)
Physician Discharge Summary  Shawna Shawna [name] KDT:[id number] DOB: 02-22-1941 DOA: 10/15/2017  PCP: [name], MD  Admit date: 10/15/2017 Discharge date: 10/16/2017  Admitted From: Home Disposition:  Home  Recommendations for Outpatient Follow-up and new medication changes:  1. Follow up with PCP in 1- week 2. Patient ruled out for acute coronary syndrome  3. If persistent symptoms may need cardiac catheterization  4. Continue medical therapy.   Home Health: hom  Equipment/Devices: no    Discharge Condition: stable CODE STATUS: full  Diet recommendation:  Heart healthy and renal prudent  Brief/Interim Summary: 77 year old Shawna [name] who presents with chest pain. Patient does have a significant past medical history for coronary artery disease status post bypass grafting, carotid artery stenosis, chronic diastolic heart failure, GERD with esophageal strictures, end stage renal disease on hemodialysis, hypertension and dyslipidemia. Patient reported chest pain, precordial, sudden, at rest, radiated to left shoulder/arm/ neck, associated with dyspnea. Lasted 40-45 minutes, improved with aspirin and nitroglycerin. On initial physical examination blood pressure 177/68, heart rate 56, respiratory rate 14, temperature 97.7, oxygen saturation 98%. Moist mucous membranes, lungs clear to auscultation bilaterally, heart S1-S2 present rhythmic, abdomen soft nontender, no lower extremity edema. Sodium 131, potassium 3.7, chloride 91, bicarbonate 24, glucose 111, BUN 33, creatinine 6.90, white count 0.1, hemoglobin 11.4, hematocrit 36.3, platelets 261, troponin less than 0.03, chest x-ray with vascular hilar congestion, no infiltrates. EKG sinus rhythm, 60 bpm, left axis deviation, left ventricular hypertrophy per EKG criteria.   Patient was admitted to the hospital with working diagnosis of chest pain to rule out acute coronary syndrome.   1. Atypical chest pain ruled out for acute coronary syndrome. Patient was  admitted to medical ward, placed on a remote telemetry monitoring, serial cardiac enzymes were negative, patient remained chest pain-free, she ruled out for acute coronary syndrome. If patient undergoes hemodialysis without recurrence of chest pain she will be discharged home. Continue medical therapy with statin, dual antiplatelet therapy, beta blockade and nitrates. Further workup with echocardiography showed left ventricle ejection fraction 60 to 65% with no wall motion abnormalities.   2. Paroxysmal atrial fibrillation. Continue rate control with amiodarone and carvedilol. Anticoagulation with aspirin and ticagrelor. Not on full anticoagulation due to history of gastrointestinal bleed.  3. Hypertension. Continue blood pressure control with clonidine,hydralazine and isosorbide.  4. End-stage renal disease. Patient euvolemic, no significant electrolyte disturbances, will have hemodialysis before discharge. Continue sevelamer and cinacalcet.    Discharge Diagnoses:  Principal Problem:   Chest pain Active Problems:   ESRD on hemodialysis (HCC)   HLD (hyperlipidemia)   GERD (gastroesophageal reflux disease)   Anemia of chronic kidney failure   Secondary hyperparathyroidism (HCC)   Chronic diastolic CHF (congestive heart failure) ([place])   Essential hypertension    Discharge Instructions   Allergies as of 10/16/2017      Reactions   Aspirin Other (See Comments)   High Doses Mess up her stomach; "makes my bowels have blood in them". Takes 81 mg EC Aspirin    Penicillins Other (See Comments)   SYNCOPE? , "makes me real weak when I take it; like I'll pass out" Has patient had a PCN reaction causing immediate rash, facial/tongue/throat swelling, SOB or lightheadedness with hypotension: Yes Has patient had a PCN reaction causing severe rash involving mucus membranes or skin necrosis: no Has patient had a PCN reaction that required hospitalization no Has patient had a PCN reaction occurring  within the last 10 years: no If all of the above  Amlodipine Swelling   Bactrim [sulfamethoxazole-trimethoprim] Rash   Contrast Media [iodinated Diagnostic Agents] Itching   Iron Itching, Other (See Comments)   "they gave me iron in dialysis; had to give me Benadryl cause I had to have the iron" (05/02/2012)   Nitrofurantoin Hives   Tylenol [acetaminophen] Itching, Other (See Comments)   Makes her feet on fire per pt   Gabapentin Other (See Comments)   Unknown reaction   Dexilant [dexlansoprazole] Other (See Comments)   Upset stomach   Levaquin [levofloxacin In D5w] Rash   Morphine And Related Itching, Other (See Comments)   Itching in feet   Plavix [clopidogrel Bisulfate] Rash   Protonix [pantoprazole Sodium] Rash   Venofer [ferric Oxide] Itching, Other (See Comments)   Patient reports using Benadryl prior to doses as [place]      Medication List    TAKE these medications   ALPRAZolam 0.25 MG tablet Commonly known as:  XANAX Take 1 tablet (0.25 mg total) by mouth 2 (two) times daily as needed for anxiety or sleep.   amiodarone 200 MG tablet Commonly known as:  PACERONE Take 1 tablet (200 mg total) by mouth daily.   carvedilol 6.25 MG tablet Commonly known as:  COREG TAKE 1 TABLET BY MOUTH TWICE DAILY WITH  A  MEAL What changed:  See the new instructions.   cinacalcet 30 MG tablet Commonly known as:  SENSIPAR Take 30 mg by mouth daily after supper.   cloNIDine 0.2 MG tablet Commonly known as:  CATAPRES Take 0.2 mg by mouth daily as needed (high blood pressure).   diphenhydrAMINE 25 mg capsule Commonly known as:  BENADRYL Take 50 mg by mouth daily as needed for itching or allergies.   diphenoxylate-atropine 2.5-0.025 MG tablet Commonly known as:  LOMOTIL Take 1 tablet by mouth 4 (four) times daily as needed for diarrhea or loose stools.   epoetin alfa 10000 UNIT/ML injection Commonly known as:  EPOGEN,PROCRIT Inject 1 mL (10,000 Units total) into the  vein every Monday, Wednesday, and Friday with hemodialysis.   ferrous sulfate 325 (65 FE) MG EC tablet Take 1 tablet (325 mg total) by mouth 2 (two) times daily with a meal.   fluticasone 50 MCG/ACT nasal spray Commonly known as:  FLONASE Place 1 spray at bedtime as needed into both nostrils for allergies.   guaiFENesin 100 MG/5ML Soln Commonly known as:  ROBITUSSIN Take 5 mLs (100 mg total) by mouth every 6 (six) hours as needed for cough or to loosen phlegm.   hydrALAZINE 50 MG tablet Commonly known as:  APRESOLINE Take 50 mg by mouth 2 (two) times daily as needed (high blood pressure).   isosorbide mononitrate 120 MG 24 hr tablet Commonly known as:  IMDUR Take 1 tablet (120 mg total) by mouth daily.   lidocaine-prilocaine cream Commonly known as:  EMLA Apply 1 application topically every Monday, Wednesday, and Friday. Prior to dialysis   multivitamin Tabs tablet Take 1 tablet by mouth daily.   nitroGLYCERIN 0.4 MG SL tablet Commonly known as:  NITROSTAT Place 1 tablet (0.4 mg total) under the tongue every 5 (five) minutes as needed for chest pain.   omeprazole 20 MG capsule Commonly known as:  PRILOSEC Take 20 mg by mouth daily.   ondansetron 4 MG disintegrating tablet Commonly known as:  ZOFRAN-ODT Take 4 mg by mouth every 8 (eight) hours as needed for nausea or vomiting.   sevelamer carbonate 800 MG tablet Commonly known as:  RENVELA Take 800-2,400  mg by mouth See admin instructions. Take 3 tablets (2400) by mouth twice daily with meals, take 1 tablet (800 mg) with snacks   simvastatin 20 MG tablet Commonly known as:  ZOCOR TAKE ONE TABLET BY MOUTH AT BEDTIME What changed:    how much to take  how to take this  when to take this   ticagrelor 90 MG Tabs tablet Commonly known as:  BRILINTA Take 1 tablet (90 mg total) by mouth 2 (two) times daily.   ticagrelor 60 MG Tabs tablet Commonly known as:  BRILINTA Take 1 tablet (60 mg total) by mouth 2 (two)  times daily. Start taking on:  11/08/2017       Allergies  Allergen Reactions  . Aspirin Other (See Comments)    High Doses Mess up her stomach; "makes my bowels have blood in them". Takes 81 mg EC Aspirin   . Penicillins Other (See Comments)    SYNCOPE? , "makes me real weak when I take it; like I'll pass out"  Has patient had a PCN reaction causing immediate rash, facial/tongue/throat swelling, SOB or lightheadedness with hypotension: Yes Has patient had a PCN reaction causing severe rash involving mucus membranes or skin necrosis: no Has patient had a PCN reaction that required hospitalization no Has patient had a PCN reaction occurring within the last 10 years: no If all of the above  . Amlodipine Swelling  . Bactrim [Sulfamethoxazole-Trimethoprim] Rash  . Contrast Media [Iodinated Diagnostic Agents] Itching  . Iron Itching and Other (See Comments)    "they gave me iron in dialysis; had to give me Benadryl cause I had to have the iron" (05/02/2012)  . Nitrofurantoin Hives  . Tylenol [Acetaminophen] Itching and Other (See Comments)    Makes her feet on fire per pt  . Gabapentin Other (See Comments)    Unknown reaction  . Dexilant [Dexlansoprazole] Other (See Comments)    Upset stomach  . Levaquin [Levofloxacin In D5w] Rash  . Morphine And Related Itching and Other (See Comments)    Itching in feet  . Plavix [Clopidogrel Bisulfate] Rash  . Protonix [Pantoprazole Sodium] Rash  . Venofer [Ferric Oxide] Itching and Other (See Comments)    Patient reports using Benadryl prior to doses as [place]    Consultations:  Cardiology   Nephrology    Procedures/Studies: Dg Chest 2 View  Result Date: 10/15/2017 CLINICAL DATA:  Chest pain radiating to the left arm. EXAM: CHEST - 2 VIEW COMPARISON:  09/03/2017 FINDINGS: Postoperative changes in the mediastinum. Heart size and pulmonary vascularity are normal. Peribronchial thickening with diffuse interstitial pattern to the  lungs likely represent chronic bronchitis. Linear scarring in the left mid lung and right lung base. No focal consolidation. No blunting of costophrenic angles. No pneumothorax. Mediastinal contours appear intact. Calcification of the aorta. IMPRESSION: Peribronchial thickening with diffuse interstitial pattern in the lungs likely represents chronic bronchitis. Scarring in the left mid lung and right lower lung. No focal consolidation. Aortic atherosclerosis. Electronically Signed   By: Lucienne Capers M.D.   On: 10/15/2017 04:05       Subjective: Patient is chest pain-free, no nausea, vomiting, no dyspnea.  Discharge Exam: Vitals:   10/16/17 0500 10/16/17 0900  BP: (!) 183/61 (!) 169/57  Pulse: 64 (!) 58  Resp: 19   Temp: 98.2 F (36.8 C)   SpO2: 97%    Vitals:   10/15/17 2220 10/16/17 0005 10/16/17 0500 10/16/17 0900  BP: (!) 200/69 (!) 192/86 (!) 183/61 Marland Kitchen)  169/57  Pulse: 71 71 64 (!) 58  Resp:   19   Temp:   98.2 F (36.8 C)   TempSrc:   Oral   SpO2:   97%   Weight:   59.3 kg (130 lb 11.7 oz)   Height:        General: Not in pain or dyspnea Neurology: Awake and alert, non focal  E ENT: mild pallor, no icterus, oral mucosa moist Cardiovascular: No JVD. S1-S2 present, rhythmic, no gallops, rubs, or murmurs. No lower extremity edema. Pulmonary: vesicular breath sounds bilaterally, adequate air movement, no wheezing, rhonchi or rales. Gastrointestinal. Abdomen no organomegaly, non tender, no rebound or guarding Skin. No rashes Musculoskeletal: no joint deformities HD access fistula in the left arm.   The results of significant diagnostics from this hospitalization (including imaging, microbiology, ancillary and laboratory) are listed below for reference.     Microbiology: No results found for this or any previous visit (from the past 240 hour(s)).   Labs: BNP (last 3 results) No results for input(s): BNP in the last 8760 hours. Basic Metabolic Panel: Recent Labs   Lab 10/15/17 0420 10/16/17 0539  NA 131* 133*  K 3.7 4.3  CL 91* 92*  CO2 24 24  GLUCOSE 111* 97  BUN 33* 51*  CREATININE 6.90* 8.94*  CALCIUM 9.6 9.8  PHOS  --  4.1   Liver Function Tests: Recent Labs  Lab 10/16/17 0539  ALBUMIN 3.2*   No results for input(s): LIPASE, AMYLASE in the last 168 hours. No results for input(s): AMMONIA in the last 168 hours. CBC: Recent Labs  Lab 10/15/17 0420 10/16/17 0539  WBC 8.1 6.8  NEUTROABS 6.3  --   HGB 11.4* 11.2*  HCT 36.3 36.0  MCV 100.6* 100.3*  PLT PLATELETS APPEAR ADEQUATE 261   Cardiac Enzymes: Recent Labs  Lab 10/15/17 1149 10/15/17 1638  TROPONINI <0.03 <0.03   BNP: Invalid input(s): POCBNP CBG: No results for input(s): GLUCAP in the last 168 hours. D-Dimer No results for input(s): DDIMER in the last 72 hours. Hgb A1c No results for input(s): HGBA1C in the last 72 hours. Lipid Profile Recent Labs    10/16/17 0539  CHOL 93  HDL 56  LDLCALC 26  TRIG 56  CHOLHDL 1.7   Thyroid function studies No results for input(s): TSH, T4TOTAL, T3FREE, THYROIDAB in the last 72 hours.  Invalid input(s): FREET3 Anemia work up No results for input(s): VITAMINB12, FOLATE, FERRITIN, TIBC, IRON, RETICCTPCT in the last 72 hours. Urinalysis    Component Value Date/Time   COLORURINE YELLOW 12/16/2012 1919   APPEARANCEUR CLOUDY (A) 12/16/2012 1919   LABSPEC 1.009 12/16/2012 1919   PHURINE 7.5 12/16/2012 1919   GLUCOSEU NEGATIVE 12/16/2012 1919   HGBUR TRACE (A) 12/16/2012 1919   BILIRUBINUR NEGATIVE 12/16/2012 1919   KETONESUR NEGATIVE 12/16/2012 1919   PROTEINUR 100 (A) 12/16/2012 1919   UROBILINOGEN 0.2 12/16/2012 1919   NITRITE NEGATIVE 12/16/2012 1919   LEUKOCYTESUR SMALL (A) 12/16/2012 1919   Sepsis Labs Invalid input(s): PROCALCITONIN,  WBC,  LACTICIDVEN Microbiology No results found for this or any previous visit (from the past 240 hour(s)).   Time coordinating discharge: 45  minutes  SIGNED:   Tawni Millers, MD  [facility] 10/16/2017, 11:Shawna AM Pager [phone]  If 7PM-7AM, please contact night-coverage [email or web address] Password [email or web address]

## 2017-10-16 NOTE — Progress Notes (Signed)
PT refusing heparin and Brilinta due to bleeding from lab stick sites. Pt does not want any more sticks. PT right anticubital dressing changed 6 times due to bleeding. Continue to monitor

## 2017-10-16 NOTE — Consult Note (Addendum)
Cardiology Consultation:   Patient ID: Shawna [name]; [id number]; 29-Apr-1941   Admit date: 10/15/2017 Date of Consult: 10/16/2017  Primary Care Provider: [name], MD Primary Cardiologist: Kate Sable, MD  Primary Electrophysiologist:  NA   Patient Profile:   Shawna [name] is a 77 y.o. female with a hx of CAD who is being seen today for the evaluation of chest pain at the request of Dr. Cathlean Sauer.  History of Present Illness:   Shawna [name] is a 77 year old female with history of CAD status post NSTEMI 12/2011 DES to the circumflex and diagonal 1, 01/2012 DES to the RCA, subsequent CABG x4.. NSTEMI 09/2016 LHC-tried medical therapy, NSTEMI 10/13/2016 overlapping DES Diagonal with residual severe multivessel native CAD, patent LIMA to the mid distal LAD, patent SVG to ramus intermediate vessel with 30% narrowing just beyond anastomosis, patent SVG to the distal RCA. LVEF 50-55% on 2-D echo 10/12/16.  She also has paroxysmal ventricular tachycardia treated with Coreg and amiodarone, PAF with CHA2DS2-VASc score of 6 not anticoagulated due to history of GI bleeding secondary to AVMs and anemia, malignant hypertension, ESRD on hemodialysis, chronic diastolic CHF.  Patient presented to the emergency room yesterday secondary to chest pain that improved with aspirin and nitroglycerin.  Troponins negative, EKG normal sinus rhythm with LVH and lateral ST-T wave changes compared to prior EKGs.  Patient said she was lying in bed and developed chest tightness into her left arm and up the left side of her neck similar to when she had her stents.  It eased with nitroglycerin and aspirin.  This is the first episode of chest pain since her stents.  She denies any exertional symptoms.  She is very sedentary.  Blood pressure was high 194/61 and has been running high at home.  Patient has not missed any Brilinta doses.  She complains of cough since her pneumonia in February.  She also has been itching a lot and thought it  was due to the Brilinta but Dr. Bronson Ing did not think this was the cause.  Past Medical History:  Diagnosis Date  . Acute on chronic respiratory failure with hypoxia ([place]) 10/10/2016  . Anxiety   . Arthritis   . AVM (arteriovenous malformation) of colon   . CAD (coronary artery disease)    a. 12/2011 NSTEMI/Cath/PCI LCX (2.25x14 Resolute DES) & D1 (2.25x22 Resolute DES);  b. 01/2012 Cath/PCI: LM 30, LAD 30p, 40-2m, D1 stent ok, 99 in sm branch of diag, LCX patent stent, OM1 20, RCA 95 ost (4.0x12 Promus DES), EF 55%;  c. 04/2012 Lexi Cardiolite  EF 48%, small area of scar @ base/mid inflat wall with mild peri-infarct ischemia.; CABG 12/4  . Carotid artery disease ([place])    a. 10-27% LICA, 08/5364   . Chronic bronchitis ([place])   . Chronic diastolic CHF (congestive heart failure) ([place])    a. 02/2012 Echo EF 60-65%, nl wall motion, Gr 1 DD, mod MR  . Colon cancer ([place]) 1992  . Esophageal stricture   . ESRD on hemodialysis ([place])    ESRD due to HTN, started dialysis 2011 and gets HD at [facility] with Dr Hinda Lenis on MWF schedule.  Access is LUA AVF as of Sept 2014.   Marland Kitchen GERD (gastroesophageal reflux disease)   . High cholesterol 12/2011  . History of blood transfusion 07/2011; 12/2011; 01/2012 X 2; 04/2012  . History of gout   . History of lower GI bleeding   . Hypertension   . Iron deficiency anemia   .  Mitral regurgitation    a. Moderate by echo, 02/2012  . Myocardial infarction ([place])   . Ovarian cancer ([place]) 1992  . Pneumonia ~ 2009  . PUD (peptic ulcer disease)   . TIA (transient ischemic attack)     Past Surgical History:  Procedure Laterality Date  . ABDOMINAL HYSTERECTOMY  1992  . APPENDECTOMY  06/1990  . AV FISTULA PLACEMENT  07/2009   left upper arm  . AV FISTULA PLACEMENT Right 09/06/2016   Procedure: RIGHT FOREARM ARTERIOVENOUS (AV) GRAFT;  Surgeon: Elam Dutch, MD;  Location: [facility] OR;  Service: Vascular;  Laterality: Right;  . AV FISTULA PLACEMENT N/A 02/24/2017    Procedure: INSERTION OF ARTERIOVENOUS (AV) GORE-TEX GRAFT ARM (BRACHIAL AXILLARY);  Surgeon: Katha Cabal, MD;  Location: [facility] ORS;  Service: Vascular;  Laterality: N/A;  . [place] Right 09/06/2016   Procedure: REMOVAL OF Right Arm ARTERIOVENOUS GORETEX GRAFT and Vein Patch angioplasty of brachial artery;  Surgeon: Angelia Mould, MD;  Location: [place];  Service: Vascular;  Laterality: Right;  . COLON RESECTION  1992  . COLON SURGERY    . CORONARY ANGIOPLASTY WITH STENT PLACEMENT  12/15/11   "2"  . CORONARY ANGIOPLASTY WITH STENT PLACEMENT  y/2013   "1; makes total of 3" (05/02/2012)  . CORONARY ARTERY BYPASS GRAFT  06/13/2012   Procedure: CORONARY ARTERY BYPASS GRAFTING (CABG);  Surgeon: Grace Isaac, MD;  Location: [place];  Service: Open Heart Surgery;  Laterality: N/A;  cabg x four;  using left internal mammary artery, and left leg greater saphenous vein harvested endoscopically  . CORONARY STENT INTERVENTION N/A 10/13/2016   Procedure: Coronary Stent Intervention;  Surgeon: Troy Sine, MD;  Location: [place] CV LAB;  Service: Cardiovascular;  Laterality: N/A;  . DIALYSIS/PERMA CATHETER REMOVAL N/A 04/18/2017   Procedure: DIALYSIS/PERMA CATHETER REMOVAL;  Surgeon: Katha Cabal, MD;  Location: [place] CV LAB;  Service: Cardiovascular;  Laterality: N/A;  . DILATION AND CURETTAGE OF UTERUS    . ESOPHAGOGASTRODUODENOSCOPY  01/20/2012   Procedure: ESOPHAGOGASTRODUODENOSCOPY (EGD);  Surgeon: Ladene Artist, MD,FACG;  Location: [facility] ENDOSCOPY;  Service: Endoscopy;  Laterality: N/A;  . ESOPHAGOGASTRODUODENOSCOPY N/A 03/26/2013   Procedure: ESOPHAGOGASTRODUODENOSCOPY (EGD);  Surgeon: Irene Shipper, MD;  [facility];  Service: Endoscopy;  Laterality: N/A;  . ESOPHAGOGASTRODUODENOSCOPY N/A 04/30/2015   Procedure: ESOPHAGOGASTRODUODENOSCOPY (EGD);  Surgeon: Rogene Houston, MD;  [facility];  Service: Endoscopy;  Laterality: N/A;  1pm - moved to 10/20  @ 1:10  . ESOPHAGOGASTRODUODENOSCOPY N/A 07/29/2016   Procedure: ESOPHAGOGASTRODUODENOSCOPY (EGD);  Surgeon: Manus Gunning, MD;  Location: [name];  Service: Gastroenterology;  Laterality: N/A;  enteroscopy  . INTRAOPERATIVE TRANSESOPHAGEAL ECHOCARDIOGRAM  06/13/2012   Procedure: INTRAOPERATIVE TRANSESOPHAGEAL ECHOCARDIOGRAM;  Surgeon: Grace Isaac, MD;  Location: [place];  Service: Open Heart Surgery;  Laterality: N/A;  . IR GENERIC HISTORICAL  07/26/2016   IR FLUORO GUIDE CV LINE RIGHT 07/26/2016 Greggory Keen, MD [facility]INTER[facility]  . IR GENERIC HISTORICAL  07/26/2016   IR US GUIDE VASC ACCESS RIGHT 07/26/2016 Greggory Keen, MD [facility]  . IR GENERIC HISTORICAL  08/02/2016   IR US GUIDE VASC ACCESS RIGHT 08/02/2016 Greggory Keen, MD [facility]  . IR GENERIC HISTORICAL  08/02/2016   IR FLUORO GUIDE CV LINE RIGHT 08/02/2016 Greggory Keen, MD [facility]  . IR RADIOLOGY PERIPHERAL GUIDED IV START  03/28/2017  . IR US GUIDE VASC ACCESS RIGHT  03/28/2017  . LEFT HEART CATH  AND CORONARY ANGIOGRAPHY N/A 09/20/2016   Procedure: Left Heart Cath and Coronary Angiography;  Surgeon: [name], MD;  Location: [place] CV LAB;  Service: Cardiovascular;  Laterality: N/A;  . LEFT HEART CATH AND CORS/GRAFTS ANGIOGRAPHY N/A 10/13/2016   Procedure: Left Heart Cath and Cors/Grafts Angiography;  Surgeon: Troy Sine, MD;  Location: [place] CV LAB;  Service: Cardiovascular;  Laterality: N/A;  . LEFT HEART CATHETERIZATION WITH CORONARY ANGIOGRAM N/A 12/15/2011   Procedure: LEFT HEART CATHETERIZATION WITH CORONARY ANGIOGRAM;  Surgeon: Burnell Blanks, MD;  Location: [facility] CATH LAB;  Service: Cardiovascular;  Laterality: N/A;  . LEFT HEART CATHETERIZATION WITH CORONARY ANGIOGRAM N/A 01/10/2012   Procedure: LEFT HEART CATHETERIZATION WITH CORONARY ANGIOGRAM;  Surgeon: Peter M Martinique, MD;  Location: [facility] CATH LAB;  Service: Cardiovascular;  Laterality: N/A;  . LEFT HEART CATHETERIZATION WITH  CORONARY ANGIOGRAM N/A 06/08/2012   Procedure: LEFT HEART CATHETERIZATION WITH CORONARY ANGIOGRAM;  Surgeon: Burnell Blanks, MD;  Location: [facility] CATH LAB;  Service: Cardiovascular;  Laterality: N/A;  . LEFT HEART CATHETERIZATION WITH CORONARY/GRAFT ANGIOGRAM N/A 12/10/2013   Procedure: LEFT HEART CATHETERIZATION WITH Beatrix Fetters;  Surgeon: [name], MD;  Location: [facility] CATH LAB;  Service: Cardiovascular;  Laterality: N/A;  . OVARY SURGERY     ovarian cancer  . REVISION OF ARTERIOVENOUS GORETEX GRAFT N/A 02/24/2017   Procedure: REVISION OF ARTERIOVENOUS GORETEX GRAFT (RESECTION);  Surgeon: Katha Cabal, MD;  Location: [facility] ORS;  Service: Vascular;  Laterality: N/A;  Earney Mallet N/A 10/15/2013   Procedure: Fistulogram;  Surgeon: Serafina Mitchell, MD;  Location: [facility] CATH LAB;  Service: Cardiovascular;  Laterality: N/A;  . THROMBECTOMY / ARTERIOVENOUS GRAFT REVISION  2011   left upper arm  . TUBAL LIGATION  1980's  . UPPER EXTREMITY ANGIOGRAPHY Bilateral 12/06/2016   Procedure: Upper Extremity Angiography;  Surgeon: Katha Cabal, MD;  Location: [place] CV LAB;  Service: Cardiovascular;  Laterality: Bilateral;  . UPPER EXTREMITY INTERVENTION Left 06/06/2017   Procedure: UPPER EXTREMITY INTERVENTION;  Surgeon: Katha Cabal, MD;  Location: [place] CV LAB;  Service: Cardiovascular;  Laterality: Left;     Home Medications:  Prior to Admission medications   Medication Sig Start Date End Date Taking? Authorizing Provider  ALPRAZolam (XANAX) 0.25 MG tablet Take 1 tablet (0.25 mg total) by mouth 2 (two) times daily as needed for anxiety or sleep. 04/05/17  Yes Arrien, Jimmy Picket, MD  amiodarone (PACERONE) 200 MG tablet Take 1 tablet (200 mg total) by mouth daily. 12/08/16  Yes Herminio Commons, MD  carvedilol (COREG) 6.25 MG tablet TAKE 1 TABLET BY MOUTH TWICE DAILY WITH  A  MEAL Patient taking differently: TAKE 1 TABLET (6.25 MG) BY MOUTH TWICE  DAILY WITH  A  MEAL 06/15/17  Yes Herminio Commons, MD  cinacalcet (SENSIPAR) 30 MG tablet Take 30 mg by mouth daily after supper.   Yes [provider]  cloNIDine (CATAPRES) 0.2 MG tablet Take 0.2 mg by mouth daily as needed (high blood pressure).    Yes [provider]  diphenhydrAMINE (BENADRYL) 25 mg capsule Take 50 mg by mouth daily as needed for itching or allergies.    Yes [provider]  diphenoxylate-atropine (LOMOTIL) 2.5-0.025 MG tablet Take 1 tablet by mouth 4 (four) times daily as needed for diarrhea or loose stools. 08/13/17  Yes Tanna Furry, MD  epoetin alfa (EPOGEN,PROCRIT) 37858 UNIT/ML injection Inject 1 mL (10,000 Units total) into the vein every Monday, Wednesday,  and Friday with hemodialysis. 04/11/16  Yes Orvan Falconer, MD  ferrous sulfate 325 (65 FE) MG EC tablet Take 1 tablet (325 mg total) by mouth 2 (two) times daily with a meal. 07/15/17 10/15/17 Yes Johnson, Clanford L, MD  fluticasone (FLONASE) 50 MCG/ACT nasal spray Place 1 spray at bedtime as needed into both nostrils for allergies.    Yes [provider]  hydrALAZINE (APRESOLINE) 50 MG tablet Take 50 mg by mouth 2 (two) times daily as needed (high blood pressure).   Yes [provider]  isosorbide mononitrate (IMDUR) 120 MG 24 hr tablet Take 1 tablet (120 mg total) by mouth daily. 05/11/17  Yes Herminio Commons, MD  lidocaine-prilocaine (EMLA) cream Apply 1 application topically every Monday, Wednesday, and Friday. Prior to dialysis   Yes [provider]  multivitamin (RENA-VIT) TABS tablet Take 1 tablet by mouth daily.   Yes [provider]  nitroGLYCERIN (NITROSTAT) 0.4 MG SL tablet Place 1 tablet (0.4 mg total) under the tongue every 5 (five) minutes as needed for chest pain. 06/05/17  Yes Herminio Commons, MD  omeprazole (PRILOSEC) 20 MG capsule Take 20 mg by mouth daily.   Yes [provider]  ondansetron (ZOFRAN-ODT) 4 MG disintegrating tablet  Take 4 mg by mouth every 8 (eight) hours as needed for nausea or vomiting.  07/12/17  Yes [provider]  sevelamer carbonate (RENVELA) 800 MG tablet Take 800-2,400 mg by mouth See admin instructions. Take 3 tablets (2400) by mouth twice daily with meals, take 1 tablet (800 mg) with snacks   Yes [provider]  simvastatin (ZOCOR) 20 MG tablet TAKE ONE TABLET BY MOUTH AT BEDTIME Patient taking differently: TAKE ONE TABLET (20 MG) BY MOUTH DAILY AT BEDTIME 02/13/17  Yes Herminio Commons, MD  ticagrelor (BRILINTA) 60 MG TABS tablet Take 1 tablet (60 mg total) by mouth 2 (two) times daily. 11/08/17  Yes Herminio Commons, MD  ticagrelor (BRILINTA) 90 MG TABS tablet Take 1 tablet (90 mg total) by mouth 2 (two) times daily. 10/05/17  Yes Herminio Commons, MD    Inpatient Medications: Scheduled Meds: . amiodarone  200 mg Oral Daily  . aspirin EC  81 mg Oral Daily  . carvedilol  6.25 mg Oral BID WC  . cinacalcet  30 mg Oral Q supper  . cloNIDine  0.2 mg Oral BID  . ferrous sulfate  325 mg Oral BID WC  . heparin  5,000 Units Subcutaneous Q8H  . hydrALAZINE  50 mg Oral BID  . isosorbide mononitrate  120 mg Oral Daily  . lidocaine-prilocaine  1 application Topical Q M,W,F  . multivitamin  1 tablet Oral Daily  . pantoprazole  40 mg Oral Daily  . sevelamer carbonate  2,400 mg Oral TID WC  . sevelamer carbonate  800 mg Oral With snacks  . simvastatin  20 mg Oral QHS  . ticagrelor  90 mg Oral BID   Continuous Infusions: . sodium chloride    . sodium chloride     PRN Meds: sodium chloride, sodium chloride, acetaminophen, albuterol, ALPRAZolam, bisacodyl, fluticasone, gi cocktail, hydrALAZINE, lidocaine (PF), lidocaine-prilocaine, morphine injection, nitroGLYCERIN, ondansetron (ZOFRAN) IV, pentafluoroprop-tetrafluoroeth  Allergies:    Allergies  Allergen Reactions  . Aspirin Other (See Comments)    High Doses Mess up her stomach; "makes my bowels have blood in  them". Takes 81 mg EC Aspirin   . Penicillins Other (See Comments)    SYNCOPE? , "makes me real weak when  I take it; like I'll pass out"  Has patient had a PCN reaction causing immediate rash, facial/tongue/throat swelling, SOB or lightheadedness with hypotension: Yes Has patient had a PCN reaction causing severe rash involving mucus membranes or skin necrosis: no Has patient had a PCN reaction that required hospitalization no Has patient had a PCN reaction occurring within the last 10 years: no If all of the above  . Amlodipine Swelling  . Bactrim [Sulfamethoxazole-Trimethoprim] Rash  . Contrast Media [Iodinated Diagnostic Agents] Itching  . Iron Itching and Other (See Comments)    "they gave me iron in dialysis; had to give me Benadryl cause I had to have the iron" (05/02/2012)  . Nitrofurantoin Hives  . Tylenol [Acetaminophen] Itching and Other (See Comments)    Makes her feet on fire per pt  . Gabapentin Other (See Comments)    Unknown reaction  . Dexilant [Dexlansoprazole] Other (See Comments)    Upset stomach  . Levaquin [Levofloxacin In D5w] Rash  . Morphine And Related Itching and Other (See Comments)    Itching in feet  . Plavix [Clopidogrel Bisulfate] Rash  . Protonix [Pantoprazole Sodium] Rash  . Venofer [Ferric Oxide] Itching and Other (See Comments)    Patient reports using Benadryl prior to doses as [place] History:   Social History   Socioeconomic History  . Marital status: Married    Spouse name: Not on file  . Number of children: Not on file  . Years of education: Not on file  . Highest education level: Not on file  Occupational History  . Not on file  Social Needs  . Financial resource strain: Not on file  . Food insecurity:    Worry: Not on file    Inability: Not on file  . Transportation needs:    Medical: Not on file    Non-medical: Not on file  Tobacco Use  . Smoking status: Never Smoker  . Smokeless tobacco: Never Used   Substance and Sexual Activity  . Alcohol use: No    Alcohol/week: 0.0 oz  . Drug use: No  . Sexual activity: Yes    Birth control/protection: Surgical  Lifestyle  . Physical activity:    Days per week: Not on file    Minutes per session: Not on file  . Stress: Not on file  Relationships  . Social connections:    Talks on phone: Not on file    Gets together: Not on file    Attends religious service: Not on file    Active member of club or organization: Not on file    Attends meetings of clubs or organizations: Not on file    Relationship status: Not on file  . Intimate partner violence:    Fear of current or ex partner: Not on file    Emotionally abused: Not on file    Physically abused: Not on file    Forced sexual activity: Not on file  Other Topics Concern  . Not on file  Social History Narrative   Lives in [place], [place] with husband.  Dialysis pt - [facility].    Family History:    Family History  Problem Relation Age of Onset  . Heart disease Mother        Heart Disease before age 19  . Hyperlipidemia Mother   . Hypertension Mother   . Diabetes Mother   . Heart attack Mother   . Heart disease Father  Heart Disease before age 78  . Hyperlipidemia Father   . Hypertension Father   . Diabetes Father   . Diabetes Sister   . Hypertension Sister   . Diabetes Brother   . Hyperlipidemia Brother   . Heart attack Brother   . Hypertension Sister   . Heart attack Brother   . Other Unknown        noncontributory for early CAD  . Colon cancer Neg Hx   . Esophageal cancer Neg Hx   . Liver disease Neg Hx   . Kidney disease Neg Hx   . Colon polyps Neg Hx      ROS:  Please see the history of present illness.  Review of Systems  Constitution: Negative.  HENT: Negative.   Eyes: Negative.   Cardiovascular: Positive for chest pain and dyspnea on exertion.  Respiratory: Positive for cough.   Hematologic/Lymphatic: Negative.   Skin: Positive for itching.   Musculoskeletal: Negative.  Negative for joint pain.  Gastrointestinal: Negative.   Genitourinary: Negative.   Neurological: Negative.     All other ROS reviewed and negative.     Physical Exam/Data:   Vitals:   10/15/17 2220 10/16/17 0005 10/16/17 0500 10/16/17 0900  BP: (!) 200/69 (!) 192/86 (!) 183/61 (!) 169/57  Pulse: 71 71 64 (!) 58  Resp:   19   Temp:   98.2 F (36.8 C)   TempSrc:   Oral   SpO2:   97%   Weight:   130 lb 11.7 oz (59.3 kg)   Height:        Intake/Output Summary (Last 24 hours) at 10/16/2017 0926 Last data filed at 10/15/2017 1730 Gross per 24 hour  Intake 180 ml  Output -  Net 180 ml   Filed Weights   10/15/17 0315 10/15/17 0803 10/16/17 0500  Weight: 128 lb (58.1 kg) 131 lb 2.8 oz (59.5 kg) 130 lb 11.7 oz (59.3 kg)   Body mass index is 24.7 kg/m.  General: Elderly, in no acute distress HEENT: normal Lymph: no adenopathy Neck: Increased JVD Endocrine:  No thryomegaly Vascular: No carotid bruits; FA pulses 2+ bilaterally without bruits  Cardiac:  normal S1, S2; RRR; 1/6 to 2/6 systolic murmur at the left sternal border Lungs: Decreased breath sounds but clear to auscultation bilaterally, no wheezing, rhonchi or rales  Abd: soft, nontender, no hepatomegaly  Ext: no edema Musculoskeletal:  No deformities, BUE and BLE strength normal and equal Skin: warm and dry  Neuro:  CNs 2-12 intact, no focal abnormalities noted Psych:  Normal affect   EKG:  The EKG was personally reviewed and demonstrates: Normal sinus rhythm with LVH, poor R wave progression anteriorly, ST-T wave changes laterally improved on today's EKG Telemetry:  Telemetry was personally reviewed and demonstrates: Sinus bradycardia  Relevant CV Studies: 2-D echo 10/12/16 Study Conclusions   - Left ventricle: The cavity size was normal. There was moderate   concentric hypertrophy. Systolic function was normal. The   estimated ejection fraction was in the range of 50% to 55%. Wall    motion was normal; there were no regional wall motion   abnormalities. Doppler parameters are consistent with abnormal   left ventricular relaxation (grade 1 diastolic dysfunction).   Doppler parameters are consistent with elevated ventricular   end-diastolic filling pressure. - Aortic valve: Trileaflet; normal thickness leaflets. There was no   regurgitation. - Aortic root: The aortic root was normal in size. - Mitral valve: Calcified annulus. Mildly thickened leaflets .  There was mild regurgitation. - Left atrium: The atrium was normal in size. - Right ventricle: Systolic function was normal. - Tricuspid valve: There was no regurgitation. - Pulmonary arteries: Systolic pressure could not be accurately   estimated. - Inferior vena cava: The vessel was normal in size. - Pericardium, extracardiac: There was no pericardial effusion.   Cardiac cath 10/13/16 Conclusion     Ramus lesion, 90 %stenosed.  Dist LAD lesion, 80 %stenosed.  LIMA.  SVG.  Ost RCA to Prox RCA lesion, 100 %stenosed.  SVG.  Ost Cx to Prox Cx lesion, 90 %stenosed.  Mid LAD lesion, 75 %stenosed.  A STENT RESOLUTE ONYX 9.24Q68 drug eluting stent was successfully placed, and overlaps previously placed stent.  Ost 1st Diag to 1st Diag lesion, 99 %stenosed.  Post intervention, there is a 0% residual stenosis.  1st Diag lesion, 20 %stenosed.   Severe multivessel native CAD with 99.9% ostial proximal diagonal 1 stenosis in a vessel which had been previously stented and now has TIMI 1 flow, 85% LAD stenosis immediately after the takeoff of this first diagonal vessel and 80% mid LAD stenosis just proximal to the insertion of the LIMA graft.   Ramus intermediate 90% ostial stenosis with competitive filling due a patent SVG.   Ostial Left circumflex 90% in-stent restenosis and occlusion of a marginal branch beyond the stented segment.   Total occlusion of a stent at the ostium of the RCA.   Patent LIMA graft  supplying the mid distal LAD with insertion beyond the mid 80% stenosis.   Patent SVG supplying the ramus intermediate vessel with 30% narrowing just beyond the anastomosis.   Patent SVG supplying the distal RCA which fills the entire RCA distally and antegrade up to the occluded ostial stent.   Successful percutaneous coronary intervention to the subtotally occluded diagonal vessel with the 99.9%  stenosis being reduced to 0% with PTCA and insertion of a 2.2515 mm Resolute Onyx DES stent into the previously placed stent post dilated to 2.45 mm and with the stenosis being reduced to 0% in the entire stented segment.  The sluggish TIMI-1 flow at the start of the procedure was improved to brisk TIMI-3 flow in the diagonal vessel following PCI. The LAD stenosis immediately after the diagonal vessel was 80% stenosed at the completion of the procedure.   RECOMMENDATION: Brillinta was used for antiplatelet therapy during this procedure due to the patient's reported rash to Plavix.  She will need to continue dual antiplatelet therapy.  Medical therapy for concomitant CAD.    Indications     Laboratory Data:  Chemistry Recent Labs  Lab 10/15/17 0420 10/16/17 0539  NA 131* 133*  K 3.7 4.3  CL 91* 92*  CO2 24 24  GLUCOSE 111* 97  BUN 33* 51*  CREATININE 6.90* 8.94*  CALCIUM 9.6 9.8  GFRNONAA 5* 4*  GFRAA 6* 4*  ANIONGAP 16* 17*    Recent Labs  Lab 10/16/17 0539  ALBUMIN 3.2*   Hematology Recent Labs  Lab 10/15/17 0420 10/16/17 0539  WBC 8.1 6.8  RBC 3.61* 3.59*  HGB 11.4* 11.2*  HCT 36.3 36.0  MCV 100.6* 100.3*  MCH 31.6 31.2  MCHC 31.4 31.1  RDW 17.7* 18.1*  PLT PLATELETS APPEAR ADEQUATE 261   Cardiac Enzymes Recent Labs  Lab 10/15/17 1149 10/15/17 1638  TROPONINI <0.03 <0.03    Recent Labs  Lab 10/15/17 0429  TROPIPOC 0.02     Radiology/Studies:  Dg Chest 2 View  Result Date:  10/15/2017 CLINICAL DATA:  Chest pain radiating to the left arm. EXAM: CHEST - 2  VIEW COMPARISON:  09/03/2017 FINDINGS: Postoperative changes in the mediastinum. Heart size and pulmonary vascularity are normal. Peribronchial thickening with diffuse interstitial pattern to the lungs likely represent chronic bronchitis. Linear scarring in the left mid lung and right lung base. No focal consolidation. No blunting of costophrenic angles. No pneumothorax. Mediastinal contours appear intact. Calcification of the aorta. IMPRESSION: Peribronchial thickening with diffuse interstitial pattern in the lungs likely represents chronic bronchitis. Scarring in the left mid lung and right lower lung. No focal consolidation. Aortic atherosclerosis. Electronically Signed   By: Lucienne Capers M.D.   On: 10/15/2017 04:05    Assessment and Plan:   Chest pain worrisome for ischemia with minor EKG changes but elevated blood pressure.  Troponins negative.  Would try to manage medically with good blood pressure control.  Resume hydralazine and clonidine.  She is already on Imdur 120 mg daily. Consider outpatient lexiscan.    CAD status post CABG in 2013, NSTEMI 09/2016 tried medical therapy, NSTEMI 10/13/16 treated with overlapping DES to the diagonal with residual severe multivessel native CAD, patent LIMA to the mid distal LAD, patent SVG to the ramus intermediate with 30% narrowing beyond the anastomosis, patent SVG to distal RCA.  LVEF 50-55% on echo 10/12/16.  Continue Brilinta aspirin and Zocor  Malignant hypertension patient takes clonidine 0.2 twice daily and hydralazine 50 mg twice daily both as needed but says she needs it.  Will order to take twice daily.  ESRD on hemodialysis  Chronic diastolic heart failure managed with dialysis  PAF with CHA2DS2-VASc equals 6 no anticoagulation secondary to GI bleed due to AVMs and chronic anemia  Nonsustained ventricular tachycardia on Coreg and amiodarone  Hyperlipidemia on Zocor   For questions or updates, please contact [place] Please consult  [email or web address] for contact info under Cardiology/STEMI.   Sumner Boast, PA-C 10/16/2017 9:26 AM    Attending note:  Patient seen and examined.  Reviewed records and discussed the case with Ms. Bonnell Public PA-C.  Patient presents for evaluation after having a recent episode of left shoulder and back discomfort radiating to the neck, not necessarily in the chest.  She states that this was somewhat similar to prior angina, she did use nitroglycerin.  Blood pressure was also significantly elevated at the time.  She states that she has been compliant with her medications and regular dialysis.  He remains on dual antiplatelet therapy.  On examination this morning she appears comfortable, no active chest pain.  Systolic blood pressure [id number], heart rate in the 50s-60s in sinus rhythm by telemetry which I personally reviewed.  Lungs are without wheezing, cardiac exam reveals RRR with 2/6 systolic murmur and no gallop.  Lab work shows potassium 4.5, BUN 51, creatinine 8.94, troponin I levels negative, LDL 26, hemoglobin 11.2, platelets 261.  Worsening reviewed her recent ECGs which shows sinus bradycardia and sinus rhythm with increased voltage consistent with LVH, also IVCD and repolarization abnormalities.  Lateral ST segment depression noted.  Chest x-ray reports peribronchial thickening and chronic bronchitic changes.  Discussed symptoms with patient and husband.  Certainly could have been an episode of unstable angina.  Fortunately, cardiac markers argue against ACS and she is stable at this time.  With plan to continue medical therapy focusing on better overall blood pressure control, hydralazine and clonidine being given today.  She is on dual antiplatelet regimen, beta-blocker, statin and nitrates.  She is also  due for hemodialysis later this afternoon.  Can reassess as to whether she has any recurring symptoms at that time.  Do not necessarily anticipate further ischemic testing now, however if she  continues to report angina on medical therapy, cardiac catheterization could be considered.  She will need to have close outpatient follow-up.  Satira Sark, M.D., F.A.C.C.

## 2017-10-16 NOTE — Care Management Obs Status (Signed)
[place] NOTIFICATION   Patient Details  Name: Shawna [name] MRN: [id number] Date of Birth: [birth year]   Medicare Observation Status Notification Given:  Yes    Sherald Barge, RN 10/16/2017, 10:46 AM

## 2017-10-16 NOTE — Progress Notes (Signed)
PT does not want anymore IV hydralazine due to believing this medication made her feet itch and caused her to become nauseous and vomit. PT takes med at home PO. BP continues to be elevated. MD aware. Continue to monitor.

## 2017-10-16 NOTE — Progress Notes (Signed)
Nitro given around 2230 for chest tightness and SOB. PRN neb given for SOB. PT BP continued to be elevated even after PRN meds provided. MD paged and one time order written. Paged MD about lab draw site that continues to bleed in right [facility]. Dressing changed and pressure applied. Continue to monitor.

## 2017-10-16 NOTE — Procedures (Signed)
     HEMODIALYSIS TREATMENT NOTE:   4 hour heparin-free dialysis completed via left upper arm AVG (15g/antegrade). Goal met: 3 liters removed without interruption in ultrafiltration.  All blood was returned and hemostasis was achieved in 10 minutes.   Rockwell Alexandria, RN, CDN

## 2017-10-16 NOTE — Progress Notes (Signed)
Patient Shawna [name] removed, telemetry removed, tolerated well. Patient tolerated dialysis fine. Patient given discharge instructions at bedside with husband present.

## 2017-10-16 NOTE — Progress Notes (Signed)
*  PRELIMINARY RESULTS* Echocardiogram 2D Echocardiogram has been performed.  Leavy Cella 10/16/2017, 11:12 AM

## 2017-10-17 LAB — HEPATITIS B SURFACE ANTIBODY, QUANTITATIVE: HEPATITIS B-POST: 19.1 m[IU]/mL

## 2017-10-17 LAB — HEPATITIS B SURFACE ANTIGEN: Hepatitis B Surface Ag: NEGATIVE

## 2017-10-19 DIAGNOSIS — L299 Pruritus, unspecified: Secondary | ICD-10-CM | POA: Diagnosis not present

## 2017-10-19 DIAGNOSIS — L280 Lichen simplex chronicus: Secondary | ICD-10-CM | POA: Diagnosis not present

## 2017-10-23 ENCOUNTER — Ambulatory Visit (INDEPENDENT_AMBULATORY_CARE_PROVIDER_SITE_OTHER): Payer: Medicare HMO | Admitting: Vascular Surgery

## 2017-10-30 DIAGNOSIS — N186 End stage renal disease: Secondary | ICD-10-CM | POA: Diagnosis not present

## 2017-10-30 DIAGNOSIS — Z992 Dependence on renal dialysis: Secondary | ICD-10-CM | POA: Diagnosis not present

## 2017-11-02 ENCOUNTER — Encounter (INDEPENDENT_AMBULATORY_CARE_PROVIDER_SITE_OTHER): Payer: Self-pay | Admitting: Vascular Surgery

## 2017-11-02 ENCOUNTER — Ambulatory Visit (INDEPENDENT_AMBULATORY_CARE_PROVIDER_SITE_OTHER): Payer: Medicare HMO | Admitting: Vascular Surgery

## 2017-11-02 VITALS — BP 185/65 | HR 63 | Resp 16 | Ht 62.0 in | Wt 128.6 lb

## 2017-11-02 DIAGNOSIS — I10 Essential (primary) hypertension: Secondary | ICD-10-CM

## 2017-11-02 DIAGNOSIS — Z992 Dependence on renal dialysis: Secondary | ICD-10-CM | POA: Diagnosis not present

## 2017-11-02 DIAGNOSIS — I6523 Occlusion and stenosis of bilateral carotid arteries: Secondary | ICD-10-CM | POA: Diagnosis not present

## 2017-11-02 DIAGNOSIS — N186 End stage renal disease: Secondary | ICD-10-CM

## 2017-11-02 DIAGNOSIS — K219 Gastro-esophageal reflux disease without esophagitis: Secondary | ICD-10-CM | POA: Diagnosis not present

## 2017-11-02 DIAGNOSIS — I25708 Atherosclerosis of coronary artery bypass graft(s), unspecified, with other forms of angina pectoris: Secondary | ICD-10-CM | POA: Diagnosis not present

## 2017-11-02 NOTE — Progress Notes (Signed)
MRN : [id number]  Shawna [name] is a 77 y.o. (07/25/1940) female who presents with chief complaint of  Chief Complaint  Patient presents with  . Follow-up    left arm and shoulder pain  .  History of Present Illness:   The patient returns to the office for followup of their dialysis access. The function of the access has been stable. The patient denies increased bleeding time or increased recirculation. Patient denies difficulty with cannulation. The patient denies hand pain or other symptoms consistent with steal phenomena.  No significant arm swelling.  The patient denies redness or swelling at the access site. The patient denies fever or chills at home or while on dialysis.  The patient denies amaurosis fugax or recent TIA symptoms. There are no recent neurological changes noted. The patient denies claudication symptoms or rest pain symptoms. The patient denies history of DVT, PE or superficial thrombophlebitis. The patient denies recent episodes of angina or shortness of breath.   Procedure(s) Performed 06/06/2017: 1. Ultrasound guidance for vascular access right femoral artery femoral artery 2. Catheter placement into left brachial artery from right femoral approach 3. Aortogram and selective angiogram of the left arm third order catheter placement             4.  Percutaneous transluminal angioplasty of the left brachial artery with a 8 mm diameter x 40 mm length Lutonix angioplasty balloon 5. Percutaneous transluminal angioplasty of the left radial artery with a 3.5 mm Ultraverse balloon 6. StarClose closure device right femoral artery  Procedure performed 02/24/2017: Left brachial axillary arteriovenous graft placement Resection of aneurysmal nonfunctional fistula      Current Meds  Medication Sig  . ALPRAZolam (XANAX) 0.25 MG tablet Take 1 tablet (0.25 mg total) by mouth 2 (two) times daily as needed for  anxiety or sleep.  Marland Kitchen amiodarone (PACERONE) 200 MG tablet Take 1 tablet (200 mg total) by mouth daily.  . carvedilol (COREG) 6.25 MG tablet TAKE 1 TABLET BY MOUTH TWICE DAILY WITH  A  MEAL (Patient taking differently: TAKE 1 TABLET (6.25 MG) BY MOUTH TWICE DAILY WITH  A  MEAL)  . cinacalcet (SENSIPAR) 30 MG tablet Take 30 mg by mouth daily after supper.  . cloNIDine (CATAPRES) 0.2 MG tablet Take 0.2 mg by mouth daily as needed (high blood pressure).   . diphenhydrAMINE (BENADRYL) 25 mg capsule Take 50 mg by mouth daily as needed for itching or allergies.   . diphenoxylate-atropine (LOMOTIL) 2.5-0.025 MG tablet Take 1 tablet by mouth 4 (four) times daily as needed for diarrhea or loose stools.  Marland Kitchen epoetin alfa (EPOGEN,PROCRIT) 92426 UNIT/ML injection Inject 1 mL (10,000 Units total) into the vein every Monday, Wednesday, and Friday with hemodialysis.  . fluticasone (FLONASE) 50 MCG/ACT nasal spray Place 1 spray at bedtime as needed into both nostrils for allergies.   Marland Kitchen guaiFENesin (ROBITUSSIN) 100 MG/5ML SOLN Take 5 mLs (100 mg total) by mouth every 6 (six) hours as needed for cough or to loosen phlegm.  . hydrALAZINE (APRESOLINE) 50 MG tablet Take 50 mg by mouth 2 (two) times daily as needed (high blood pressure).  . isosorbide mononitrate (IMDUR) 120 MG 24 hr tablet Take 1 tablet (120 mg total) by mouth daily.  Marland Kitchen lidocaine-prilocaine (EMLA) cream Apply 1 application topically every Monday, Wednesday, and Friday. Prior to dialysis  . multivitamin (RENA-VIT) TABS tablet Take 1 tablet by mouth daily.  . nitroGLYCERIN (NITROSTAT) 0.4 MG SL tablet Place 1 tablet (0.4 mg total)  under the tongue every 5 (five) minutes as needed for chest pain.  Marland Kitchen omeprazole (PRILOSEC) 20 MG capsule Take 20 mg by mouth daily.  . ondansetron (ZOFRAN-ODT) 4 MG disintegrating tablet Take 4 mg by mouth every 8 (eight) hours as needed for nausea or vomiting.   . sevelamer carbonate (RENVELA) 800 MG tablet Take 800-2,400 mg by  mouth See admin instructions. Take 3 tablets (2400) by mouth twice daily with meals, take 1 tablet (800 mg) with snacks  . simvastatin (ZOCOR) 20 MG tablet TAKE ONE TABLET BY MOUTH AT BEDTIME (Patient taking differently: TAKE ONE TABLET (20 MG) BY MOUTH DAILY AT BEDTIME)  . [START ON 11/08/2017] ticagrelor (BRILINTA) 60 MG TABS tablet Take 1 tablet (60 mg total) by mouth 2 (two) times daily.  . ticagrelor (BRILINTA) 90 MG TABS tablet Take 1 tablet (90 mg total) by mouth 2 (two) times daily.    Past Medical History:  Diagnosis Date  . Acute on chronic respiratory failure with hypoxia ([place]) 10/10/2016  . Anxiety   . Arthritis   . AVM (arteriovenous malformation) of colon   . CAD (coronary artery disease)    a. 12/2011 NSTEMI/Cath/PCI LCX (2.25x14 Resolute DES) & D1 (2.25x22 Resolute DES);  b. 01/2012 Cath/PCI: LM 30, LAD 30p, 40-53m, D1 stent ok, 99 in sm branch of diag, LCX patent stent, OM1 20, RCA 95 ost (4.0x12 Promus DES), EF 55%;  c. 04/2012 Lexi Cardiolite  EF 48%, small area of scar @ base/mid inflat wall with mild peri-infarct ischemia.; CABG 12/4  . Carotid artery disease ([place])    a. 90-24% LICA, 0/9735   . Chronic bronchitis ([place])   . Chronic diastolic CHF (congestive heart failure) ([place])    a. 02/2012 Echo EF 60-65%, nl wall motion, Gr 1 DD, mod MR  . Colon cancer ([place]) 1992  . Esophageal stricture   . ESRD on hemodialysis ([place])    ESRD due to HTN, started dialysis 2011 and gets HD at [facility] with Dr Hinda Lenis on MWF schedule.  Access is LUA AVF as of Sept 2014.   Marland Kitchen GERD (gastroesophageal reflux disease)   . High cholesterol 12/2011  . History of blood transfusion 07/2011; 12/2011; 01/2012 X 2; 04/2012  . History of gout   . History of lower GI bleeding   . Hypertension   . Iron deficiency anemia   . Mitral regurgitation    a. Moderate by echo, 02/2012  . Myocardial infarction ([place])   . Ovarian cancer ([place]) 1992  . Pneumonia ~ 2009  . PUD (peptic ulcer disease)   . TIA  (transient ischemic attack)     Past Surgical History:  Procedure Laterality Date  . ABDOMINAL HYSTERECTOMY  1992  . APPENDECTOMY  06/1990  . AV FISTULA PLACEMENT  07/2009   left upper arm  . AV FISTULA PLACEMENT Right 09/06/2016   Procedure: RIGHT FOREARM ARTERIOVENOUS (AV) GRAFT;  Surgeon: Elam Dutch, MD;  Location: [facility] OR;  Service: Vascular;  Laterality: Right;  . AV FISTULA PLACEMENT N/A 02/24/2017   Procedure: INSERTION OF ARTERIOVENOUS (AV) GORE-TEX GRAFT ARM (BRACHIAL AXILLARY);  Surgeon: Katha Cabal, MD;  Location: [facility] ORS;  Service: Vascular;  Laterality: N/A;  . [place] Right 09/06/2016   Procedure: REMOVAL OF Right Arm ARTERIOVENOUS GORETEX GRAFT and Vein Patch angioplasty of brachial artery;  Surgeon: Angelia Mould, MD;  Location: [place];  Service: Vascular;  Laterality: Right;  . COLON RESECTION  1992  . COLON SURGERY    .  CORONARY ANGIOPLASTY WITH STENT PLACEMENT  12/15/11   "2"  . CORONARY ANGIOPLASTY WITH STENT PLACEMENT  y/2013   "1; makes total of 3" (05/02/2012)  . CORONARY ARTERY BYPASS GRAFT  06/13/2012   Procedure: CORONARY ARTERY BYPASS GRAFTING (CABG);  Surgeon: Grace Isaac, MD;  Location: [place];  Service: Open Heart Surgery;  Laterality: N/A;  cabg x four;  using left internal mammary artery, and left leg greater saphenous vein harvested endoscopically  . CORONARY STENT INTERVENTION N/A 10/13/2016   Procedure: Coronary Stent Intervention;  Surgeon: Troy Sine, MD;  Location: [place] CV LAB;  Service: Cardiovascular;  Laterality: N/A;  . DIALYSIS/PERMA CATHETER REMOVAL N/A 04/18/2017   Procedure: DIALYSIS/PERMA CATHETER REMOVAL;  Surgeon: Katha Cabal, MD;  Location: [place] CV LAB;  Service: Cardiovascular;  Laterality: N/A;  . DILATION AND CURETTAGE OF UTERUS    . ESOPHAGOGASTRODUODENOSCOPY  01/20/2012   Procedure: ESOPHAGOGASTRODUODENOSCOPY (EGD);  Surgeon: Ladene Artist, MD,FACG;  Location: [facility] ENDOSCOPY;  Service:  Endoscopy;  Laterality: N/A;  . ESOPHAGOGASTRODUODENOSCOPY N/A 03/26/2013   Procedure: ESOPHAGOGASTRODUODENOSCOPY (EGD);  Surgeon: Irene Shipper, MD;  Location: [facility] ENDOSCOPY;  Service: Endoscopy;  Laterality: N/A;  . ESOPHAGOGASTRODUODENOSCOPY N/A 04/30/2015   Procedure: ESOPHAGOGASTRODUODENOSCOPY (EGD);  Surgeon: Rogene Houston, MD;  [facility];  Service: Endoscopy;  Laterality: N/A;  1pm - moved to 10/20 @ 1:10  . ESOPHAGOGASTRODUODENOSCOPY N/A 07/29/2016   Procedure: ESOPHAGOGASTRODUODENOSCOPY (EGD);  Surgeon: Manus Gunning, MD;  Location: [place];  Service: Gastroenterology;  Laterality: N/A;  enteroscopy  . INTRAOPERATIVE TRANSESOPHAGEAL ECHOCARDIOGRAM  06/13/2012   Procedure: INTRAOPERATIVE TRANSESOPHAGEAL ECHOCARDIOGRAM;  Surgeon: Grace Isaac, MD;  Location: [place];  Service: Open Heart Surgery;  Laterality: N/A;  . IR GENERIC HISTORICAL  07/26/2016   IR FLUORO GUIDE CV LINE RIGHT 07/26/2016 Greggory Keen, MD [facility]INTER[facility]  . IR GENERIC HISTORICAL  07/26/2016   IR US GUIDE VASC ACCESS RIGHT 07/26/2016 Greggory Keen, MD [facility]  . IR GENERIC HISTORICAL  08/02/2016   IR US GUIDE VASC ACCESS RIGHT 08/02/2016 Greggory Keen, MD [facility]  . IR GENERIC HISTORICAL  08/02/2016   IR FLUORO GUIDE CV LINE RIGHT 08/02/2016 Greggory Keen, MD [facility]  . IR RADIOLOGY PERIPHERAL GUIDED IV START  03/28/2017  . IR US GUIDE VASC ACCESS RIGHT  03/28/2017  . LEFT HEART CATH AND CORONARY ANGIOGRAPHY N/A 09/20/2016   Procedure: Left Heart Cath and Coronary Angiography;  Surgeon: [name], MD;  Location: [place] CV LAB;  Service: Cardiovascular;  Laterality: N/A;  . LEFT HEART CATH AND CORS/GRAFTS ANGIOGRAPHY N/A 10/13/2016   Procedure: Left Heart Cath and Cors/Grafts Angiography;  Surgeon: Troy Sine, MD;  Location: [place] CV LAB;  Service: Cardiovascular;  Laterality: N/A;  . LEFT HEART CATHETERIZATION WITH CORONARY ANGIOGRAM N/A 12/15/2011   Procedure: LEFT  HEART CATHETERIZATION WITH CORONARY ANGIOGRAM;  Surgeon: Burnell Blanks, MD;  Location: [facility] CATH LAB;  Service: Cardiovascular;  Laterality: N/A;  . LEFT HEART CATHETERIZATION WITH CORONARY ANGIOGRAM N/A 01/10/2012   Procedure: LEFT HEART CATHETERIZATION WITH CORONARY ANGIOGRAM;  Surgeon: Peter M Martinique, MD;  Location: [facility] CATH LAB;  Service: Cardiovascular;  Laterality: N/A;  . LEFT HEART CATHETERIZATION WITH CORONARY ANGIOGRAM N/A 06/08/2012   Procedure: LEFT HEART CATHETERIZATION WITH CORONARY ANGIOGRAM;  Surgeon: Burnell Blanks, MD;  Location: [facility] CATH LAB;  Service: Cardiovascular;  Laterality: N/A;  . LEFT HEART CATHETERIZATION WITH CORONARY/GRAFT ANGIOGRAM N/A 12/10/2013   Procedure: LEFT HEART CATHETERIZATION  WITH Beatrix Fetters;  Surgeon: [name], MD;  Location: [facility] CATH LAB;  Service: Cardiovascular;  Laterality: N/A;  . OVARY SURGERY     ovarian cancer  . REVISION OF ARTERIOVENOUS GORETEX GRAFT N/A 02/24/2017   Procedure: REVISION OF ARTERIOVENOUS GORETEX GRAFT (RESECTION);  Surgeon: Katha Cabal, MD;  Location: [facility] ORS;  Service: Vascular;  Laterality: N/A;  Earney [place]let N/A 10/15/2013   Procedure: Fistulogram;  Surgeon: Serafina Mitchell, MD;  Location: [facility] CATH LAB;  Service: Cardiovascular;  Laterality: N/A;  . THROMBECTOMY / ARTERIOVENOUS GRAFT REVISION  2011   left upper arm  . TUBAL LIGATION  1980's  . UPPER EXTREMITY ANGIOGRAPHY Bilateral 12/06/2016   Procedure: Upper Extremity Angiography;  Surgeon: Katha Cabal, MD;  Location: [place] CV LAB;  Service: Cardiovascular;  Laterality: Bilateral;  . UPPER EXTREMITY INTERVENTION Left 06/06/2017   Procedure: UPPER EXTREMITY INTERVENTION;  Surgeon: Katha Cabal, MD;  Location: [place] CV LAB;  Service: Cardiovascular;  Laterality: Left;    Social History Social History   Tobacco Use  . Smoking status: Never Smoker  . Smokeless tobacco: Never Used  Substance Use Topics  .  Alcohol use: No    Alcohol/week: 0.0 oz  . Drug use: No    Family History Family History  Problem Relation Age of Onset  . Heart disease Mother        Heart Disease before age 64  . Hyperlipidemia Mother   . Hypertension Mother   . Diabetes Mother   . Heart attack Mother   . Heart disease Father        Heart Disease before age 28  . Hyperlipidemia Father   . Hypertension Father   . Diabetes Father   . Diabetes Sister   . Hypertension Sister   . Diabetes Brother   . Hyperlipidemia Brother   . Heart attack Brother   . Hypertension Sister   . Heart attack Brother   . Other Unknown        noncontributory for early CAD  . Colon cancer Neg Hx   . Esophageal cancer Neg Hx   . Liver disease Neg Hx   . Kidney disease Neg Hx   . Colon polyps Neg Hx     Allergies  Allergen Reactions  . Aspirin Other (See Comments)    High Doses Mess up her stomach; "makes my bowels have blood in them". Takes 81 mg EC Aspirin   . Penicillins Other (See Comments)    SYNCOPE? , "makes me real weak when I take it; like I'll pass out"  Has patient had a PCN reaction causing immediate rash, facial/tongue/throat swelling, SOB or lightheadedness with hypotension: Yes Has patient had a PCN reaction causing severe rash involving mucus membranes or skin necrosis: no Has patient had a PCN reaction that required hospitalization no Has patient had a PCN reaction occurring within the last 10 years: no If all of the above  . Amlodipine Swelling  . Bactrim [Sulfamethoxazole-Trimethoprim] Rash  . Contrast Media [Iodinated Diagnostic Agents] Itching  . Iron Itching and Other (See Comments)    "they gave me iron in dialysis; had to give me Benadryl cause I had to have the iron" (05/02/2012)  . Nitrofurantoin Hives  . Tylenol [Acetaminophen] Itching and Other (See Comments)    Makes her feet on fire per pt  . Gabapentin Other (See Comments)    Unknown reaction  . Dexilant [Dexlansoprazole] Other (See  Comments)    Upset stomach  .  Levaquin [Levofloxacin In D5w] Rash  . Morphine And Related Itching and Other (See Comments)    Itching in feet  . Plavix [Clopidogrel Bisulfate] Rash  . Protonix [Pantoprazole Sodium] Rash  . Venofer [Ferric Oxide] Itching and Other (See Comments)    Patient reports using Benadryl prior to doses as [place] (Negative unless checked)  Constitutional: [] Weight loss  [] Fever  [] Chills Cardiac: [] Chest pain   [] Chest pressure   [] Palpitations   [] Shortness of breath when laying flat   [] Shortness of breath with exertion. Vascular:  [] Pain in legs with walking   [] Pain in legs at rest  [] History of DVT   [] Phlebitis   [] Swelling in legs   [] Varicose veins   [] Non-healing ulcers Pulmonary:   [] Uses home oxygen   [] Productive cough   [] Hemoptysis   [] Wheeze  [] COPD   [] Asthma Neurologic:  [] Dizziness   [] Seizures   [] History of stroke   [] History of TIA  [] Aphasia   [] Vissual changes   [] Weakness or numbness in arm   [] Weakness or numbness in leg Musculoskeletal:   [] Joint swelling   [] Joint pain   [] Low back pain Hematologic:  [] Easy bruising  [] Easy bleeding   [] Hypercoagulable state   [] Anemic Gastrointestinal:  [] Diarrhea   [] Vomiting  [] Gastroesophageal reflux/heartburn   [] Difficulty swallowing. Genitourinary:  [x] Chronic kidney disease   [] Difficult urination  [] Frequent urination   [] Blood in urine Skin:  [] Rashes   [] Ulcers  Psychological:  [] History of anxiety   []  History of major depression.  Physical Examination  Vitals:   11/02/17 1349  BP: (!) 185/65  Pulse: 63  Resp: 16  Weight: 128 lb 9.6 oz (58.3 kg)  Height: 5\' 2"  (1.575 m)   Body mass index is 23.52 kg/m. Gen: WD/WN, NAD Head: [place]/AT, No temporalis wasting.  Ear/Nose/Throat: Hearing grossly intact, nares w/o erythema or drainage Eyes: PER, EOMI, sclera nonicteric.  Neck: Supple, no large masses.   Pulmonary:  Good air movement, no audible wheezing  bilaterally, no use of accessory muscles.  Cardiac: RRR, no JVD Vascular: left arm AV access good thrill good bruit Vessel Right Left  Radial Palpable Palpable  Ulnar Palpable Palpable  Brachial Palpable Palpable  Gastrointestinal: Non-distended. No guarding/no peritoneal signs.  Musculoskeletal: M/S 5/5 throughout.  No deformity or atrophy.  Neurologic: CN 2-12 intact. Symmetrical.  Speech is fluent. Motor exam as listed above. Psychiatric: Judgment intact, Mood & affect appropriate for pt's clinical situation. Dermatologic: No rashes or ulcers noted.  No changes consistent with cellulitis. Lymph : No lichenification or skin changes of chronic lymphedema.  CBC Lab Results  Component Value Date   WBC 6.8 10/16/2017   HGB 11.2 (L) 10/16/2017   HCT 36.0 10/16/2017   MCV 100.3 (H) 10/16/2017   PLT 261 10/16/2017    BMET    Component Value Date/Time   NA 133 (L) 10/16/2017 0539   K 4.3 10/16/2017 0539   CL 92 (L) 10/16/2017 0539   CO2 24 10/16/2017 0539   GLUCOSE 97 10/16/2017 0539   BUN 51 (H) 10/16/2017 0539   CREATININE 8.94 (H) 10/16/2017 0539   CALCIUM 9.8 10/16/2017 0539   CALCIUM 8.1 (L) 05/29/2013 1814   GFRNONAA 4 (L) 10/16/2017 0539   GFRAA 4 (L) 10/16/2017 0539   Estimated Creatinine Clearance: 4.2 mL/min (A) (by C-G formula based on SCr of 8.94 mg/dL (H)).  COAG Lab Results  Component Value Date   INR 0.98 03/28/2017  INR 1.01 02/08/2017   INR 1.05 01/24/2017    Radiology Dg Chest 2 View  Result Date: 10/15/2017 CLINICAL DATA:  Chest pain radiating to the left arm. EXAM: CHEST - 2 VIEW COMPARISON:  09/03/2017 FINDINGS: Postoperative changes in the mediastinum. Heart size and pulmonary vascularity are normal. Peribronchial thickening with diffuse interstitial pattern to the lungs likely represent chronic bronchitis. Linear scarring in the left mid lung and right lung base. No focal consolidation. No blunting of costophrenic angles. No pneumothorax.  Mediastinal contours appear intact. Calcification of the aorta. IMPRESSION: Peribronchial thickening with diffuse interstitial pattern in the lungs likely represents chronic bronchitis. Scarring in the left mid lung and right lower lung. No focal consolidation. Aortic atherosclerosis. Electronically Signed   By: Lucienne Capers M.D.   On: 10/15/2017 04:05    Assessment/Plan 1. ESRD on hemodialysis [facility]) Recommend:  The patient is doing well and currently has adequate dialysis access. The patient's dialysis center is not reporting any access issues. Flow pattern is stable when compared to the prior ultrasound.  The patient should have a duplex ultrasound of the dialysis access in 6 months. The patient will follow-up with me in the office after each ultrasound    - VAS [place] (AVF, AVG); Future  2. Bilateral carotid artery stenosis Recommend:  Given the patient's asymptomatic subcritical stenosis no further invasive testing or surgery at this time.  Continue antiplatelet therapy as prescribed Continue management of CAD, HTN and Hyperlipidemia Healthy heart diet,  encouraged exercise at least 4 times per week Follow up in 12 months with duplex ultrasound and physical exam   3. Essential hypertension Continue antihypertensive medications as already ordered, these medications have been reviewed and there are no changes at this time.   4. Coronary artery disease of bypass graft of native heart with stable angina pectoris ([place]) Continue cardiac and antihypertensive medications as already ordered and reviewed, no changes at this time.  Continue statin as ordered and reviewed, no changes at this time  Nitrates PRN for chest pain   5. Gastroesophageal reflux disease, esophagitis presence not specified Continue antihypertensive medications as already ordered, these medications have been reviewed and there are no changes at this time.  Avoidence of caffeine and  alcohol  Moderate elevation of the head of the bed    Hortencia Pilar, MD  11/02/2017 1:57 PM

## 2017-11-03 DIAGNOSIS — J40 Bronchitis, not specified as acute or chronic: Secondary | ICD-10-CM | POA: Diagnosis not present

## 2017-11-07 DIAGNOSIS — Z992 Dependence on renal dialysis: Secondary | ICD-10-CM | POA: Diagnosis not present

## 2017-11-07 DIAGNOSIS — N186 End stage renal disease: Secondary | ICD-10-CM | POA: Diagnosis not present

## 2017-11-08 DIAGNOSIS — N186 End stage renal disease: Secondary | ICD-10-CM | POA: Diagnosis not present

## 2017-11-08 DIAGNOSIS — Z992 Dependence on renal dialysis: Secondary | ICD-10-CM | POA: Diagnosis not present

## 2017-11-09 ENCOUNTER — Other Ambulatory Visit: Payer: Self-pay | Admitting: Physician Assistant

## 2017-11-09 ENCOUNTER — Encounter (INDEPENDENT_AMBULATORY_CARE_PROVIDER_SITE_OTHER): Payer: Self-pay | Admitting: Vascular Surgery

## 2017-11-09 DIAGNOSIS — L299 Pruritus, unspecified: Secondary | ICD-10-CM | POA: Diagnosis not present

## 2017-11-21 ENCOUNTER — Encounter (INDEPENDENT_AMBULATORY_CARE_PROVIDER_SITE_OTHER): Payer: Self-pay | Admitting: Internal Medicine

## 2017-11-21 ENCOUNTER — Ambulatory Visit (INDEPENDENT_AMBULATORY_CARE_PROVIDER_SITE_OTHER): Payer: Medicare HMO | Admitting: Internal Medicine

## 2017-11-21 VITALS — BP 142/76 | HR 60 | Temp 97.6°F | Resp 18 | Ht 61.0 in | Wt 126.9 lb

## 2017-11-21 DIAGNOSIS — K219 Gastro-esophageal reflux disease without esophagitis: Secondary | ICD-10-CM | POA: Diagnosis not present

## 2017-11-21 DIAGNOSIS — K582 Mixed irritable bowel syndrome: Secondary | ICD-10-CM | POA: Diagnosis not present

## 2017-11-21 DIAGNOSIS — D500 Iron deficiency anemia secondary to blood loss (chronic): Secondary | ICD-10-CM

## 2017-11-21 MED ORDER — BENEFIBER DRINK MIX PO PACK: 4.0000 g | PACK | Freq: Every day | ORAL | Status: DC

## 2017-11-21 MED ORDER — OMEPRAZOLE 20 MG PO CPDR: 20.0000 mg | DELAYED_RELEASE_CAPSULE | Freq: Every day | ORAL | 3 refills | Status: DC

## 2017-11-21 MED ORDER — BISACODYL 10 MG RE SUPP: 10.0000 mg | RECTAL | 0 refills | Status: DC | PRN

## 2017-11-21 NOTE — Patient Instructions (Addendum)
Notify if you have rectal bleeding or relapse of abdominal pain. Remember you cannot take Advil Aleve or similar OTC medications.

## 2017-11-21 NOTE — Progress Notes (Signed)
Presenting complaint;  Follow-up for iron deficiency anemia/GI bleed GERD and irregular bowel movements.  Database and  Subjective:  Patient is 77 year old Afro-American female with multiple medical problems who has history of GI bleed due to small bowel angiodysplasia who was hospitalized in September last year for abdominal pain.  CT reveals a focal enteritis with fluid collection consistent with an abscess.  She was eventually confirmed to have bacteremia.  Was felt that bowel injury was possibly either infection or ischemic.  She responded to antibiotic therapy. She was seen in January most recently and appeared to be doing well and maintaining her hemoglobin. She now returns for scheduled visit. She remains on hemodialysis.  She says her appetite is up and down.  She has lost 3 pounds in the last 4 months.  She complains of diarrhea altering with constipation.  When she has diarrhea she has urgency and may have multiple bowel movements in a day.  Then she can go 3 days without a bowel movement and has to use Dulcolax suppository.  When she has diarrhea she is taking Lomotil.  She denies melena or rectal bleeding.  She complains of intermittent pain primarily across lower abdomen.  This pain is mild and does not occur daily and different in the pain she had 7 months ago.  She also complains of nausea only when she turns. She states heartburn is well controlled with therapy.  She needs prescription for omeprazole.  She states she Receives iron infusion at the time of dialysis.   Current Medications: Outpatient Encounter Medications as of 11/21/2017  Medication Sig  . ALPRAZolam (XANAX) 0.25 MG tablet Take 1 tablet (0.25 mg total) by mouth 2 (two) times daily as needed for anxiety or sleep.  Marland Kitchen amiodarone (PACERONE) 200 MG tablet Take 1 tablet (200 mg total) by mouth daily.  . carvedilol (COREG) 6.25 MG tablet TAKE 1 TABLET BY MOUTH TWICE DAILY WITH  A  MEAL (Patient taking differently: TAKE 1  TABLET (6.25 MG) BY MOUTH TWICE DAILY WITH  A  MEAL)  . cinacalcet (SENSIPAR) 30 MG tablet Take 30 mg by mouth daily after supper.  . cloNIDine (CATAPRES) 0.2 MG tablet Take 0.2 mg by mouth daily as needed (high blood pressure).   . diphenhydrAMINE (BENADRYL) 25 mg capsule Take 50 mg by mouth daily as needed for itching or allergies.   . diphenoxylate-atropine (LOMOTIL) 2.5-0.025 MG tablet Take 1 tablet by mouth 4 (four) times daily as needed for diarrhea or loose stools.  Marland Kitchen epoetin alfa (EPOGEN,PROCRIT) 97673 UNIT/ML injection Inject 1 mL (10,000 Units total) into the vein every Monday, Wednesday, and Friday with hemodialysis.  . fluticasone (FLONASE) 50 MCG/ACT nasal spray Place 1 spray at bedtime as needed into both nostrils for allergies.   Marland Kitchen guaiFENesin (ROBITUSSIN) 100 MG/5ML SOLN Take 5 mLs (100 mg total) by mouth every 6 (six) hours as needed for cough or to loosen phlegm.  . hydrALAZINE (APRESOLINE) 50 MG tablet Take 50 mg by mouth 2 (two) times daily as needed (high blood pressure).  . isosorbide mononitrate (IMDUR) 120 MG 24 hr tablet Take 1 tablet (120 mg total) by mouth daily.  Marland Kitchen lidocaine-prilocaine (EMLA) cream Apply 1 application topically every Monday, Wednesday, and Friday. Prior to dialysis  . multivitamin (RENA-VIT) TABS tablet Take 1 tablet by mouth daily.  . nitroGLYCERIN (NITROSTAT) 0.4 MG SL tablet Place 1 tablet (0.4 mg total) under the tongue every 5 (five) minutes as needed for chest pain.  Marland Kitchen omeprazole (PRILOSEC) 20  MG capsule Take 20 mg by mouth daily.  . ondansetron (ZOFRAN-ODT) 4 MG disintegrating tablet Take 4 mg by mouth every 8 (eight) hours as needed for nausea or vomiting.   . sevelamer carbonate (RENVELA) 800 MG tablet Take 800-2,400 mg by mouth See admin instructions. Take 3 tablets (2400) by mouth twice daily with meals, take 1 tablet (800 mg) with snacks  . simvastatin (ZOCOR) 20 MG tablet TAKE ONE TABLET BY MOUTH AT BEDTIME (Patient taking differently: TAKE  ONE TABLET (20 MG) BY MOUTH DAILY AT BEDTIME)  . ticagrelor (BRILINTA) 60 MG TABS tablet Take 1 tablet (60 mg total) by mouth 2 (two) times daily.  . ferrous sulfate 325 (65 FE) MG EC tablet Take 1 tablet (325 mg total) by mouth 2 (two) times daily with a meal.  . [DISCONTINUED] ticagrelor (BRILINTA) 90 MG TABS tablet Take 1 tablet (90 mg total) by mouth 2 (two) times daily. (Patient not taking: Reported on 11/21/2017)   No facility-administered encounter medications on file as of 11/21/2017.      Objective: Blood pressure (!) 142/76, pulse 60, temperature 97.6 F (36.4 C), temperature source Oral, resp. rate 18, height 5\' 1"  (1.549 m), weight 126 lb 14.4 oz (57.6 kg). Patient is alert and in no acute distress. Conjunctiva is pink. Sclera is nonicteric Oropharyngeal mucosa is normal. No neck masses or thyromegaly noted. Cardiac exam with regular rhythm normal S1 and split S2. No murmur or gallop noted. Lungs are clear to auscultation. Abdomen is symmetrical.  Bowel sounds are normal.  No bruit noted.  On palpation abdomen is soft.  She has mild tenderness at LLQ.  No guarding.  No organomegaly or masses. No LE edema or clubbing noted.  Labs/studies Results: CBC from 10/16/2017  WBC 6.8, H&H 11.2 and 36.0 and platelet count 261K.   Assessment:  #1.  History of iron deficiency anemia secondary to chronic GI bleed most likely due to small bowel angiodysplasia.  However she could also have small bowel ulcers due to ischemia.  She is asymptomatic and I therefore do not feel there is indication for further evaluation such as small bowel given capsule study.  CTA abdomen in September 2018 revealed narrowing both stool celiac trunk and SMA as well as IMA.  If she develops abdominal pain or frank bleeding will consider repeating the study.  She is maintaining hemoglobin at a respectable level.  This may be normal given history of end-stage renal disease.   #2.  GERD.  She is doing well with  therapy.  Given her condition I believe she should continue PPI chronically..  #3.  Suspect she has irritable bowel syndrome as she is having both diarrhea and constipation.  She should try not to use Lomotil unless absolutely necessary as it seemed to trigger her constipation and her symptoms.  She would benefit from addition of fiber supplements.   Plan:  New prescription given for omeprazole 20 mg p.o. every morning. Dulcolax suppository on as-needed basis. Benefiber 4 g p.o. nightly. Patient advised to call office if she has abdominal pain melena or rectal bleeding. Office visit in 6 months.

## 2017-11-29 IMAGING — US US EXTREM  UP VENOUS*L*
1 series · 13 of 24 positions shown · non-contrast
Comparison: None.

CLINICAL DATA: Left hand pain since yesterday. Left hand turning
cold for 2 weeks. Patient has EFRAIN graft in the lower arm and
antecubital fossa region.

EXAM:
LEFT UPPER EXTREMITY VENOUS DOPPLER ULTRASOUND
TECHNIQUE: Gray-scale sonography with graded compression, as well as color
Doppler and duplex ultrasound were performed to evaluate the upper
extremity deep venous system from the level of the subclavian vein
and including the jugular, axillary, basilic, radial, ulnar and
upper cephalic vein. Spectral Doppler was utilized to evaluate flow
at rest and with distal augmentation maneuvers.

[Series 1: us extrem up venous*left* · 0.05mm/px · 13 of 57 slices shown]
[im 1/57]
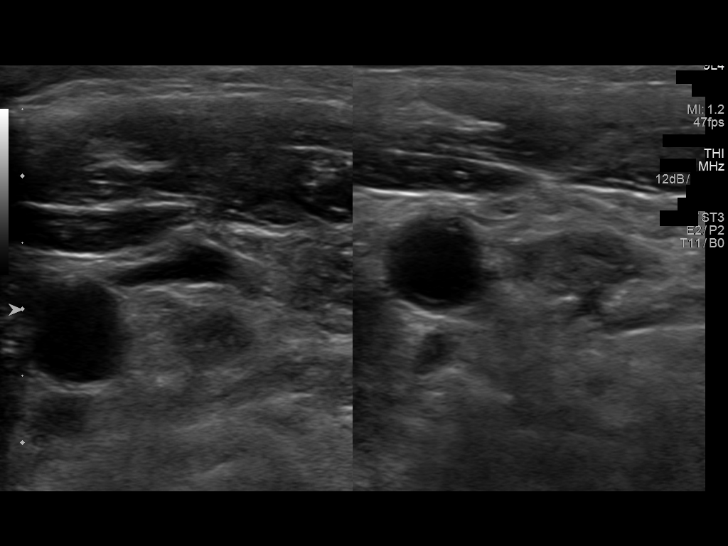
[im 5/57]
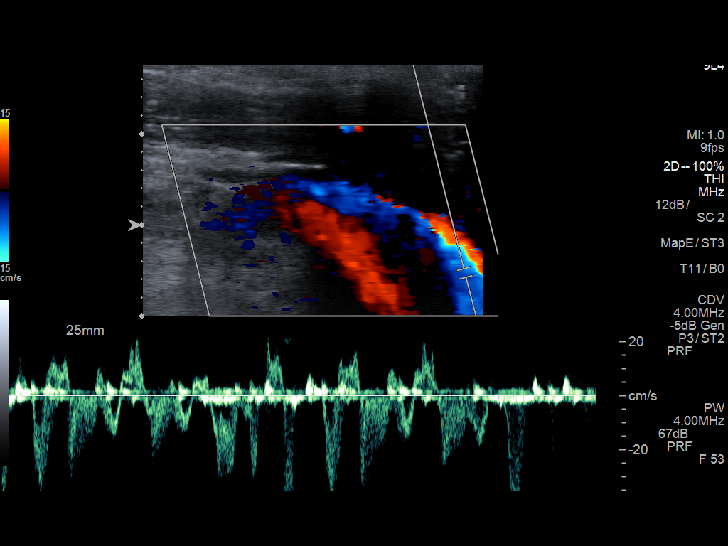
[im 10/57]
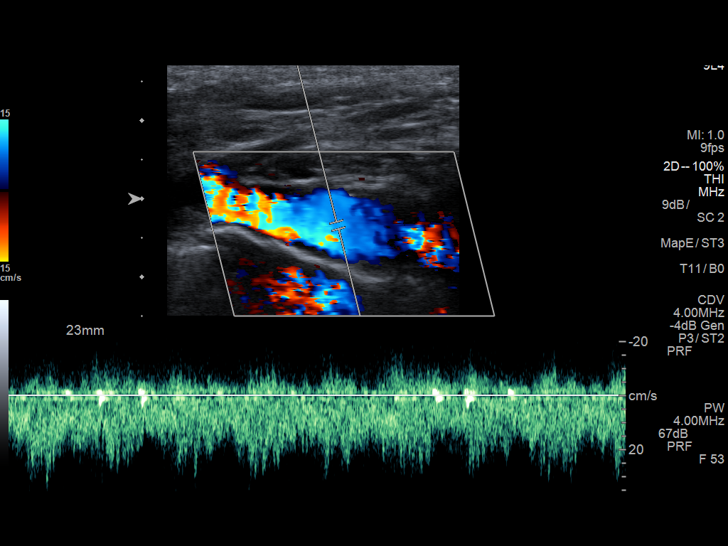
[im 15/57]
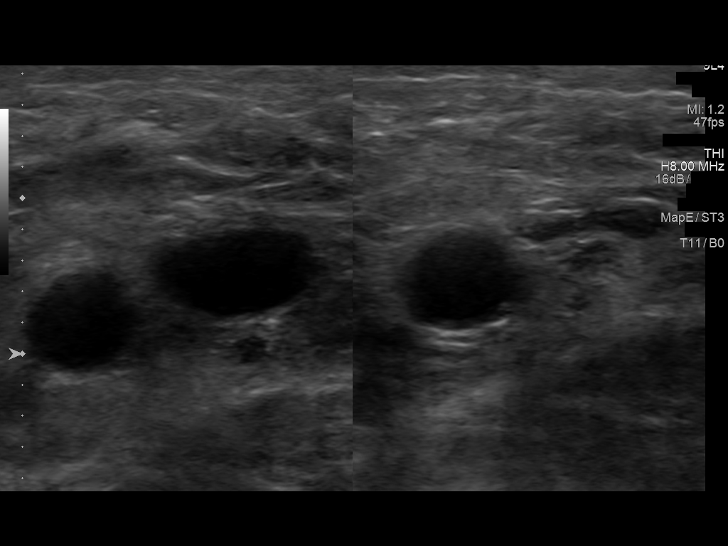
[im 20/57]
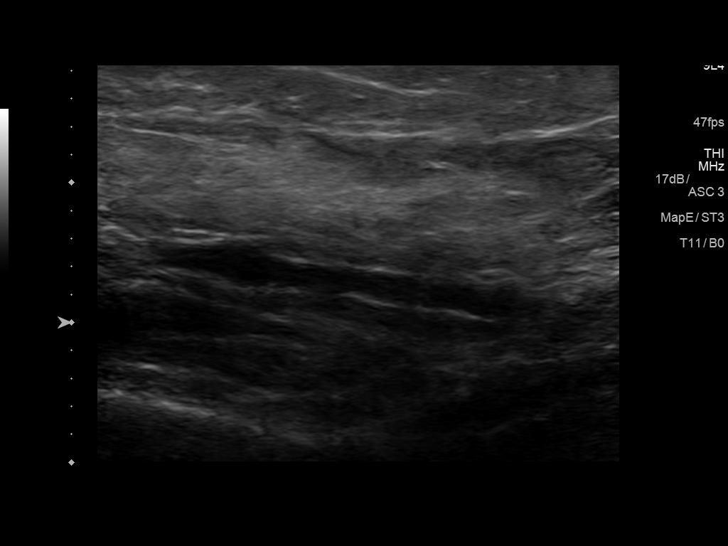
[im 25/57]
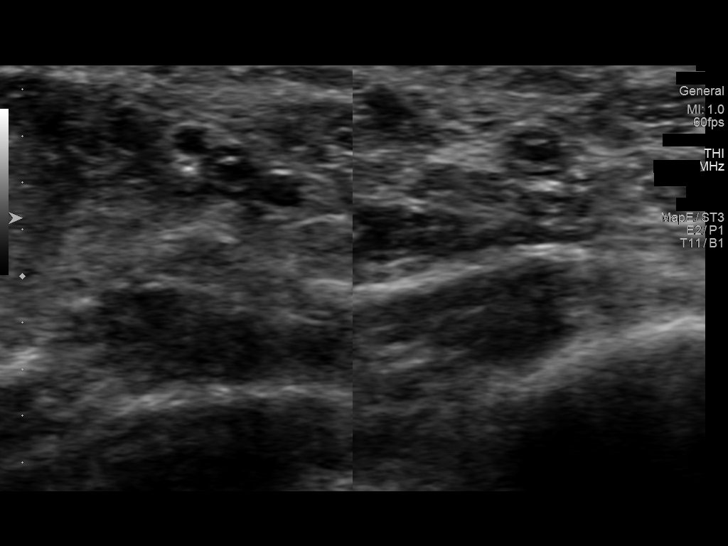
[im 30/57]
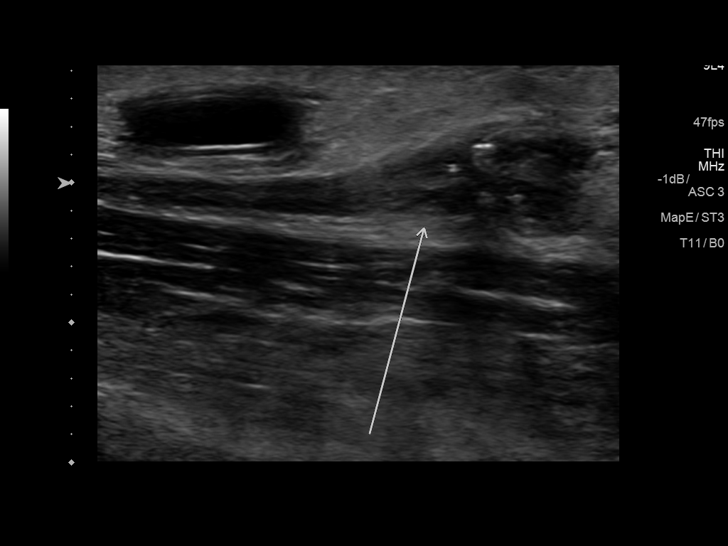
[im 32/57]
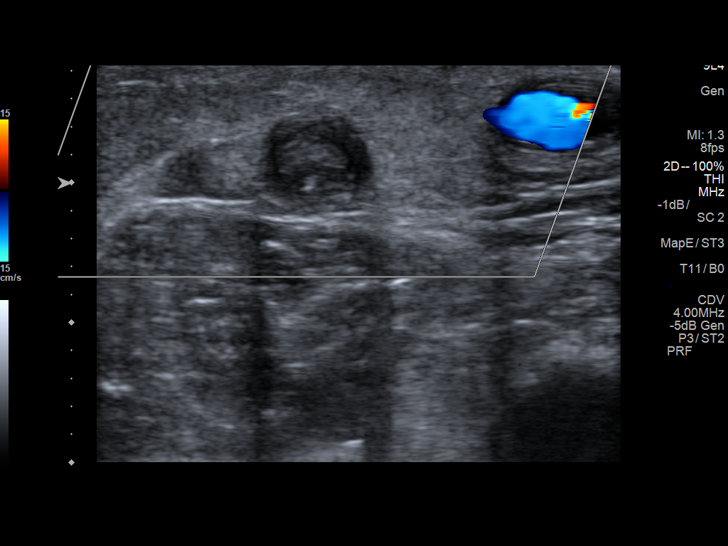
[im 37/57]
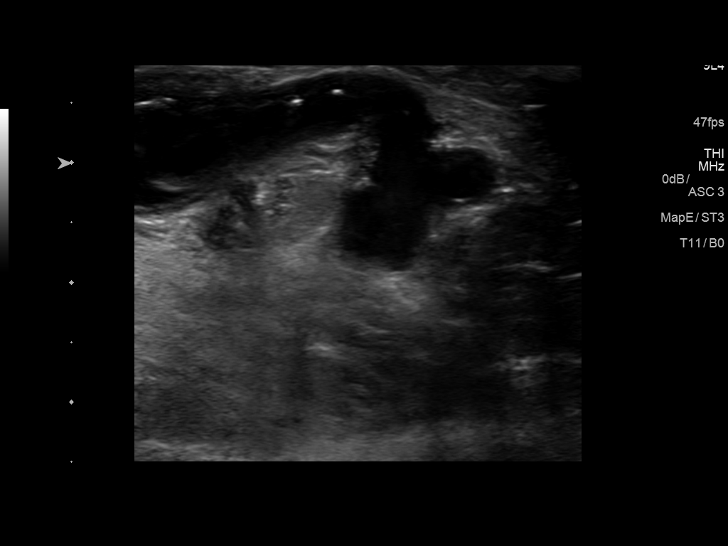
[im 42/57]
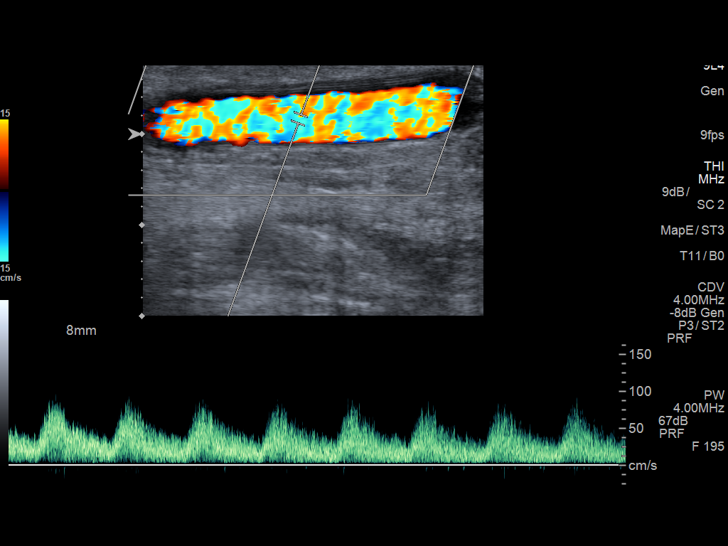
[im 47/57]
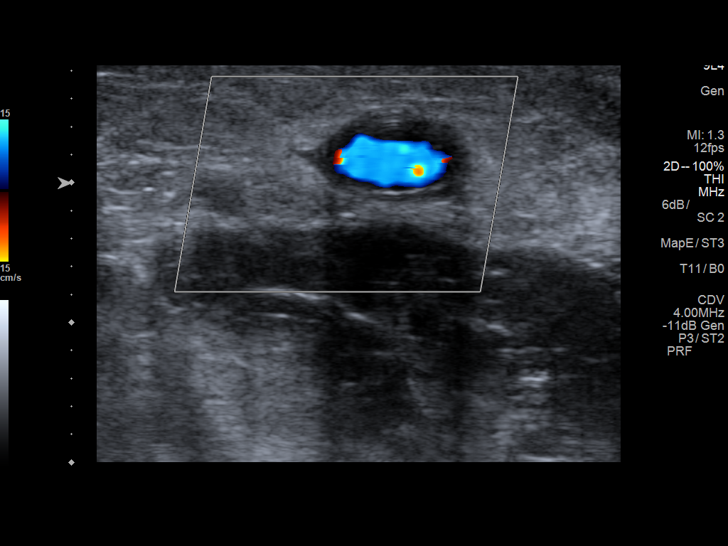
[im 52/57]
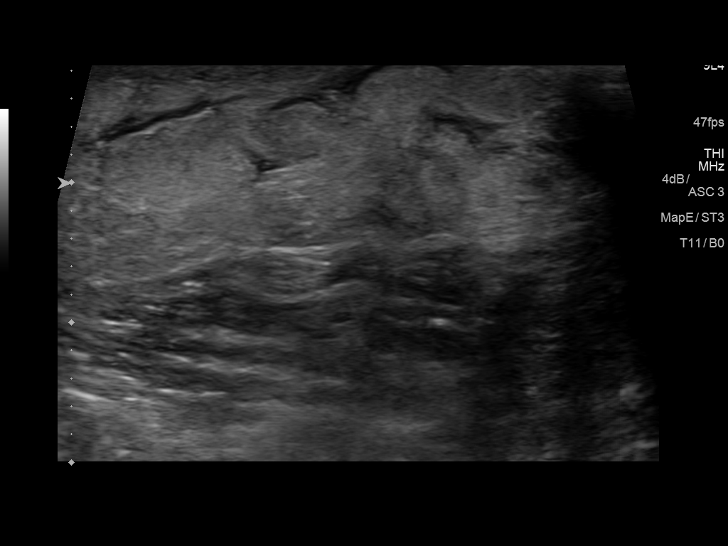
[im 57/57]
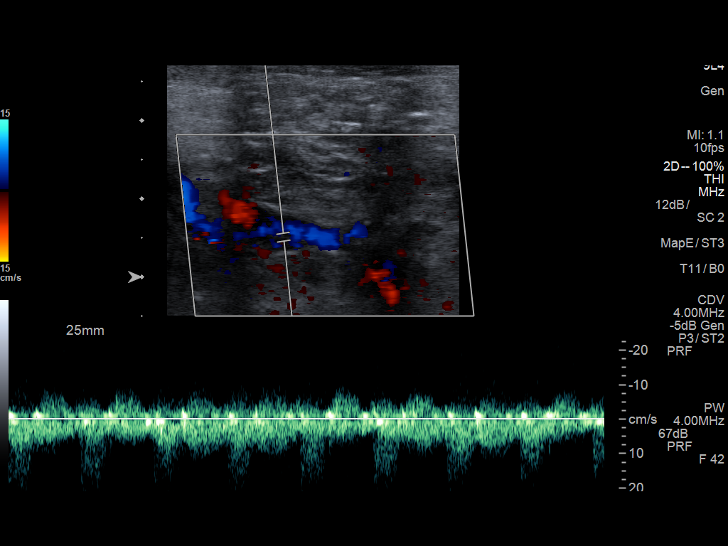

[13 of 24 positions shown; findings below may reference images not displayed]

FINDINGS: Contralateral Subclavian Vein: Respiratory phasicity is normal and
symmetric with the symptomatic side. No evidence of thrombus. Normal
compressibility.

Internal Jugular Vein: No evidence of thrombus. Normal
compressibility, respiratory phasicity and response to augmentation.

Subclavian Vein: No evidence of thrombus. Normal compressibility,
respiratory phasicity and response to augmentation.

Axillary Vein: No evidence of thrombus. Normal compressibility,
respiratory phasicity and response to augmentation.

Cephalic Vein: Irregular hypoechoic to mildly hyperechoic area in
the left forearm near the patient's dialysis graft that may reflect
a chronically thrombosed cephalic vein.

Basilic Vein: No evidence of thrombus. Normal compressibility,
respiratory phasicity and response to augmentation.

Brachial Veins: No evidence of thrombus. Normal compressibility,
respiratory phasicity and response to augmentation.

Radial Veins: No evidence of thrombus. Normal compressibility,
respiratory phasicity and response to augmentation.

Ulnar Veins: No evidence of thrombus. Normal compressibility,
respiratory phasicity and response to augmentation.

Venous Reflux:  None visualized.

Other Findings:  The dialysis fistula is widely patent.
IMPRESSION: 1. Probable chronic thrombosis in the cephalic vein in the patient's
dialysis fistula in the left arm.
2. Remaining veins are widely patent. Dialysis fistula is widely
patent.

## 2017-11-30 ENCOUNTER — Ambulatory Visit (HOSPITAL_COMMUNITY)
Admission: RE | Admit: 2017-11-30 | Discharge: 2017-11-30 | Disposition: A | Discharge status: Home / Self Care | Payer: Medicare HMO | Source: Ambulatory Visit | Attending: Urology | Admitting: Urology

## 2017-11-30 ENCOUNTER — Encounter (HOSPITAL_COMMUNITY): Payer: Self-pay

## 2017-12-01 ENCOUNTER — Other Ambulatory Visit: Payer: Self-pay | Admitting: Cardiovascular Disease

## 2017-12-02 IMAGING — US US ART/VEN ABD/PELV/SCROTUM DOPPLER COMPLETE
1 series · 2 of 2 positions shown · non-contrast
Comparison: Noncontrast CT [DATE], [DATE]

CLINICAL DATA: 75-year-old female with possible mesenteric
ischemia. End-stage renal disease

Cardiovascular risk factors include hypertension, known prior
coronary artery disease, known prior vascular disease with vascular
surgery.
EXAM:
US MESENTERIC ARTERIAL DOPPLER

[Series 1: us art/ven abd/pelv/scrotum doppler complete · 2 of 2 slices shown]
[im 1/2]
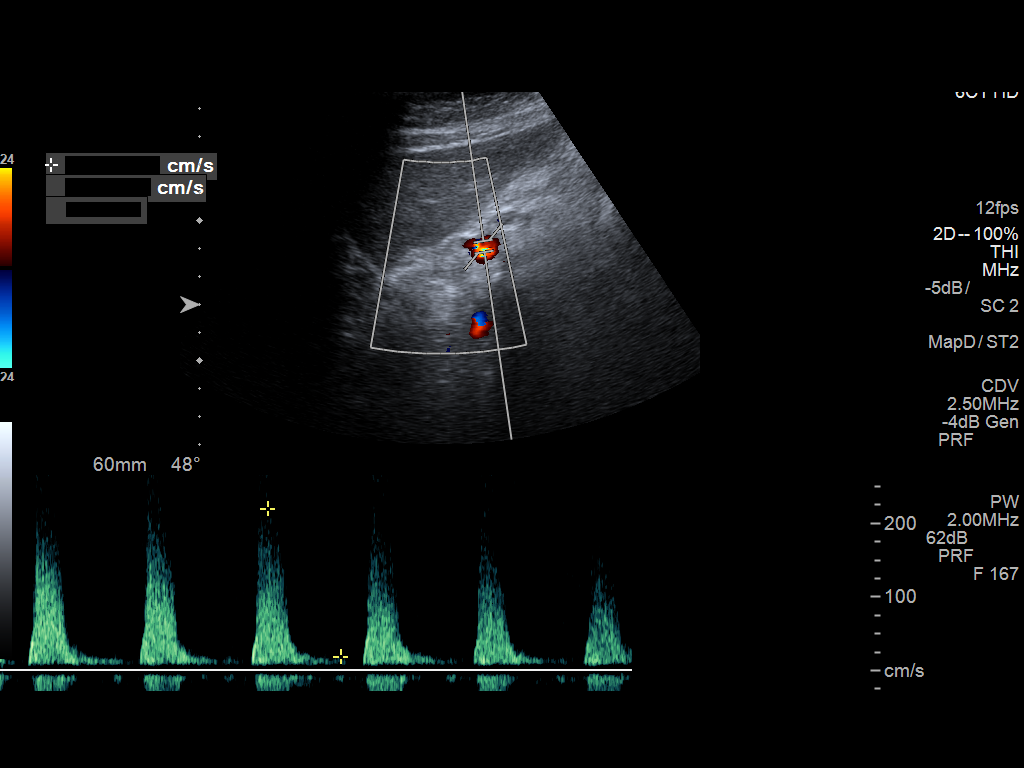
[im 2/2]
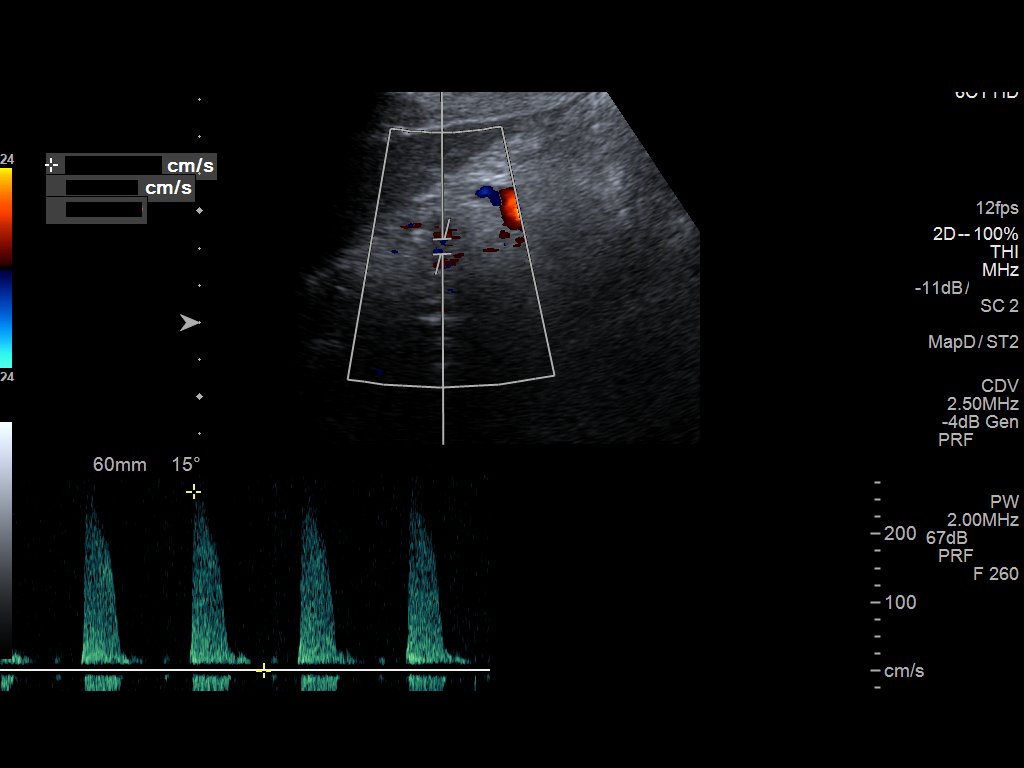

[2 of 2 positions shown; findings below may reference images not displayed]

FINDINGS: Celiac axis: 262 cm/sec

Celiac axis with inspiration: 220 cm/sec

Celiac axis with expiration: 262 cm/sec

Splenic artery: Splenic artery not visualize

Hepatic artery: Hepatic artery not visualized

SMA: 433 cm/sec

IMA: 375 cm/sec
IMPRESSION: Elevated velocities at the origin of the celiac artery, superior
mesenteric artery, inferior mesenteric artery, compatible with
greater than 70% stenosis at all 3 mesenteric vessels origin.

## 2017-12-03 DIAGNOSIS — J40 Bronchitis, not specified as acute or chronic: Secondary | ICD-10-CM | POA: Diagnosis not present

## 2017-12-06 DIAGNOSIS — Z992 Dependence on renal dialysis: Secondary | ICD-10-CM | POA: Diagnosis not present

## 2017-12-06 DIAGNOSIS — N186 End stage renal disease: Secondary | ICD-10-CM | POA: Diagnosis not present

## 2017-12-08 DIAGNOSIS — N186 End stage renal disease: Secondary | ICD-10-CM | POA: Diagnosis not present

## 2017-12-08 DIAGNOSIS — Z992 Dependence on renal dialysis: Secondary | ICD-10-CM | POA: Diagnosis not present

## 2017-12-11 DIAGNOSIS — Z992 Dependence on renal dialysis: Secondary | ICD-10-CM | POA: Diagnosis not present

## 2017-12-11 DIAGNOSIS — N186 End stage renal disease: Secondary | ICD-10-CM | POA: Diagnosis not present

## 2017-12-13 DIAGNOSIS — Z992 Dependence on renal dialysis: Secondary | ICD-10-CM | POA: Diagnosis not present

## 2017-12-13 DIAGNOSIS — N186 End stage renal disease: Secondary | ICD-10-CM | POA: Diagnosis not present

## 2017-12-15 DIAGNOSIS — N186 End stage renal disease: Secondary | ICD-10-CM | POA: Diagnosis not present

## 2017-12-15 DIAGNOSIS — Z992 Dependence on renal dialysis: Secondary | ICD-10-CM | POA: Diagnosis not present

## 2017-12-18 ENCOUNTER — Other Ambulatory Visit: Payer: Self-pay

## 2017-12-18 NOTE — Patient Outreach (Signed)
[facility]) Care Management  12/18/2017  SAYDE LISH 1940-11-26 [id number]   After review of the chart the patient does not have a Primary care physician within the [facility] network. The patient is not eligible for services at this time and will close the case.  Lazaro Arms RN, BSN, [place] Direct Dial:  [phone]  Fax: [phone]

## 2017-12-19 ENCOUNTER — Emergency Department (HOSPITAL_COMMUNITY)
Admission: EM | Admit: 2017-12-19 | Discharge: 2017-12-20 | Disposition: A | Discharge status: Home / Self Care | Payer: Medicare HMO | Attending: Emergency Medicine | Admitting: Emergency Medicine

## 2017-12-19 ENCOUNTER — Emergency Department (HOSPITAL_COMMUNITY): Payer: Medicare HMO

## 2017-12-19 ENCOUNTER — Other Ambulatory Visit: Payer: Self-pay

## 2017-12-19 ENCOUNTER — Encounter (HOSPITAL_COMMUNITY): Payer: Self-pay | Admitting: *Deleted

## 2017-12-19 DIAGNOSIS — Z85038 Personal history of other malignant neoplasm of large intestine: Secondary | ICD-10-CM | POA: Diagnosis not present

## 2017-12-19 DIAGNOSIS — I132 Hypertensive heart and chronic kidney disease with heart failure and with stage 5 chronic kidney disease, or end stage renal disease: Secondary | ICD-10-CM | POA: Insufficient documentation

## 2017-12-19 DIAGNOSIS — Z7982 Long term (current) use of aspirin: Secondary | ICD-10-CM | POA: Diagnosis not present

## 2017-12-19 DIAGNOSIS — I252 Old myocardial infarction: Secondary | ICD-10-CM | POA: Insufficient documentation

## 2017-12-19 DIAGNOSIS — R072 Precordial pain: Secondary | ICD-10-CM | POA: Insufficient documentation

## 2017-12-19 DIAGNOSIS — Z79899 Other long term (current) drug therapy: Secondary | ICD-10-CM | POA: Insufficient documentation

## 2017-12-19 DIAGNOSIS — Z8543 Personal history of malignant neoplasm of ovary: Secondary | ICD-10-CM | POA: Insufficient documentation

## 2017-12-19 DIAGNOSIS — Z992 Dependence on renal dialysis: Secondary | ICD-10-CM | POA: Insufficient documentation

## 2017-12-19 DIAGNOSIS — I5032 Chronic diastolic (congestive) heart failure: Secondary | ICD-10-CM | POA: Insufficient documentation

## 2017-12-19 DIAGNOSIS — Z951 Presence of aortocoronary bypass graft: Secondary | ICD-10-CM | POA: Diagnosis not present

## 2017-12-19 DIAGNOSIS — I2510 Atherosclerotic heart disease of native coronary artery without angina pectoris: Secondary | ICD-10-CM | POA: Diagnosis not present

## 2017-12-19 DIAGNOSIS — N186 End stage renal disease: Secondary | ICD-10-CM | POA: Diagnosis not present

## 2017-12-19 DIAGNOSIS — Z955 Presence of coronary angioplasty implant and graft: Secondary | ICD-10-CM | POA: Diagnosis not present

## 2017-12-19 DIAGNOSIS — R079 Chest pain, unspecified: Secondary | ICD-10-CM | POA: Diagnosis not present

## 2017-12-19 IMAGING — DX DG CHEST 2V
2 series · 2 of 2 positions shown · non-contrast
Comparison: Most recent radiograph [DATE].

CLINICAL DATA: Left-sided chest pain radiating into the back.

EXAM:
CHEST - 2 VIEW

[chest lat]
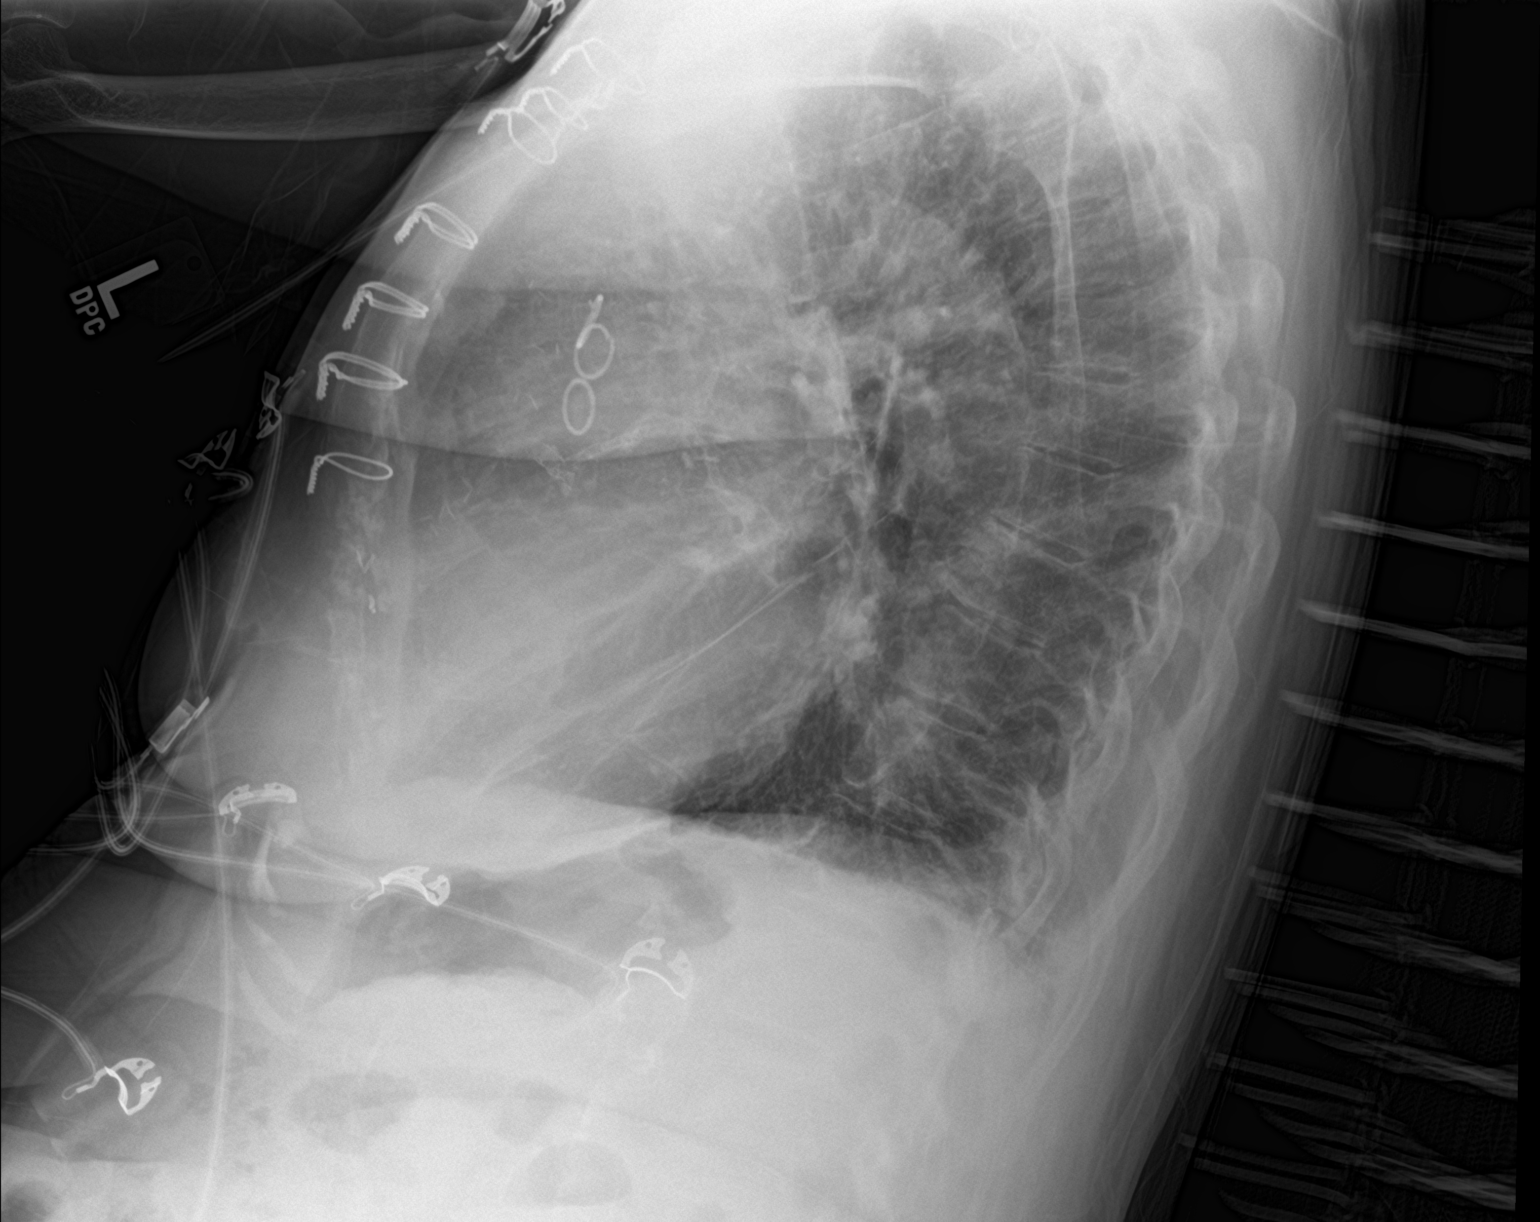

[chest ap]
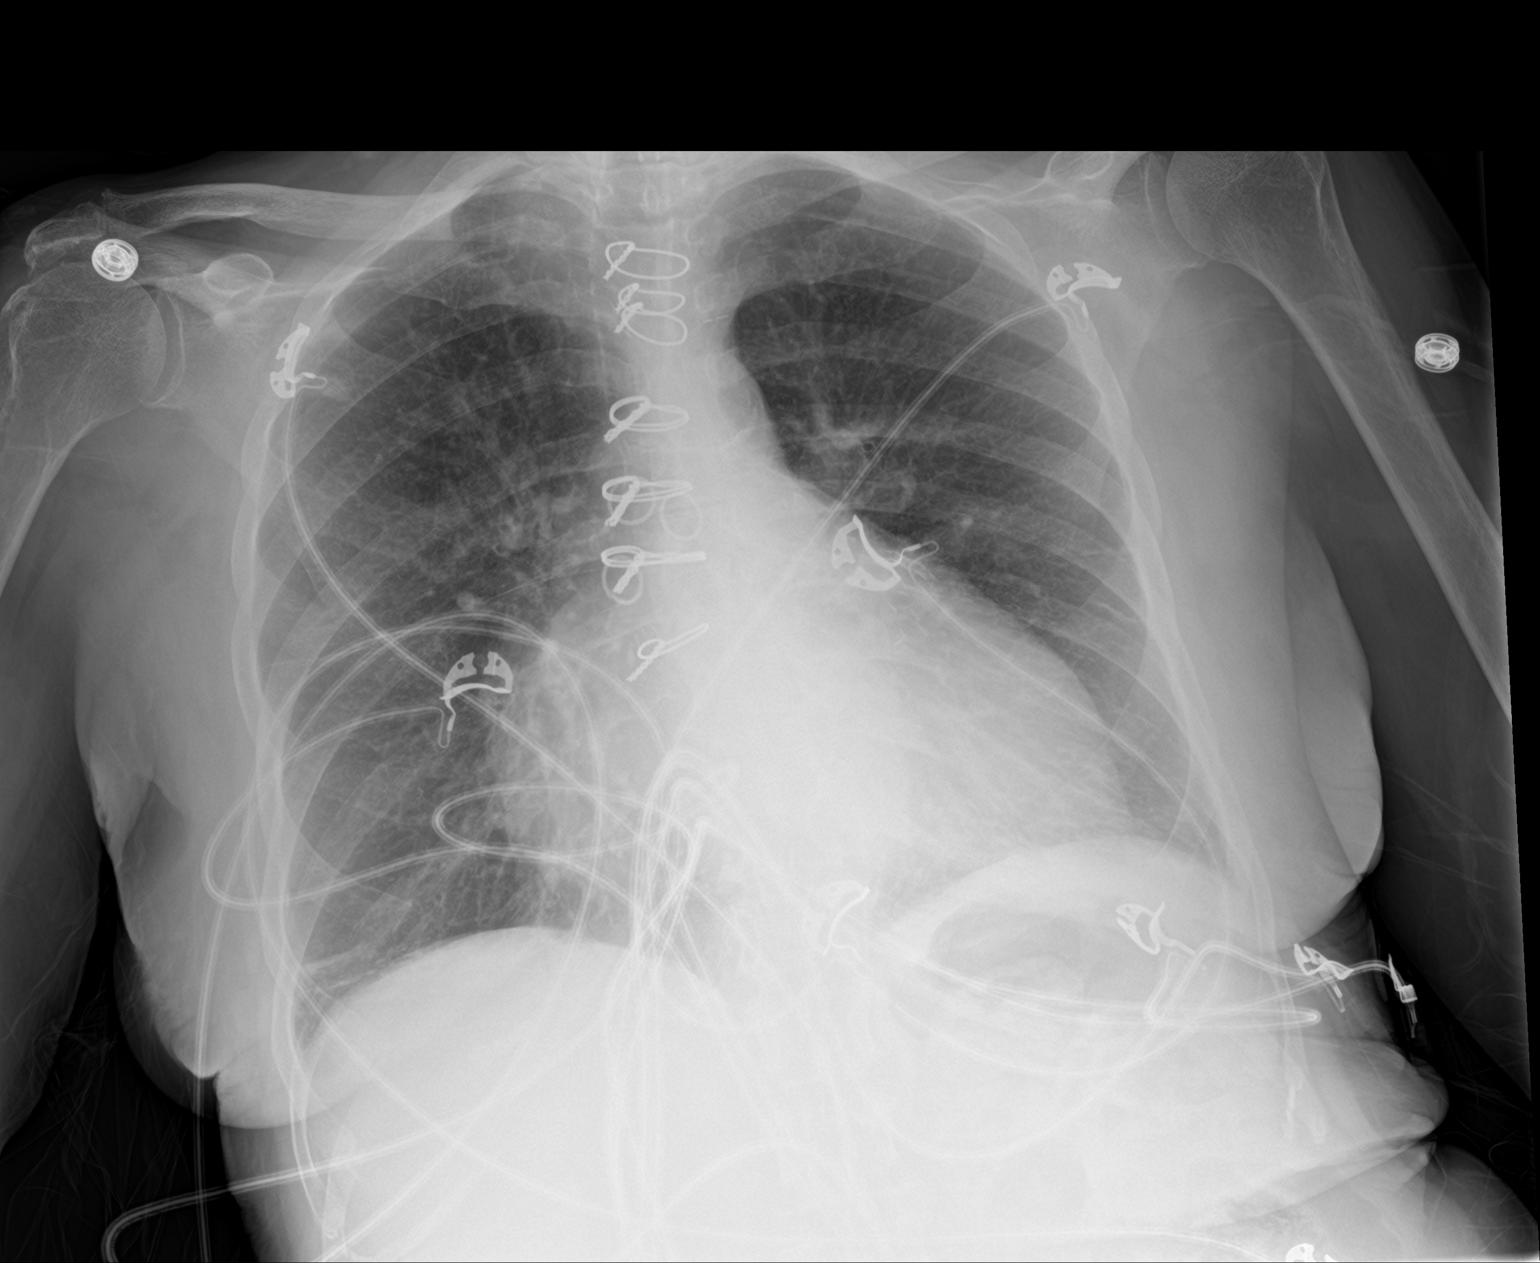

[2 of 2 positions shown; findings below may reference images not displayed]

FINDINGS: Post median sternotomy. Increased cardiomegaly from prior exam.
There is aortic atherosclerosis. Chronic peribronchial thickening
without change from prior. Possible small pleural effusions.
Scarring in the left mid lung and right lung base, unchanged. No new
focal airspace disease. No pneumothorax.
IMPRESSION: Cardiomegaly with small pleural effusions. Bronchial thickening
appears chronic and unchanged.

## 2017-12-19 NOTE — ED Triage Notes (Signed)
Pt c/o chest pain that started at 2100 tonight; pt states the pain is to the left side of her chest and radiates around to back of her chest; pt states she took two nitroglycerin and 162 mg asa; pt states the nitro helped with the pain; pt states she was sitting up when the pain started

## 2017-12-19 NOTE — ED Notes (Signed)
Patient transported to X-ray 

## 2017-12-20 ENCOUNTER — Ambulatory Visit (INDEPENDENT_AMBULATORY_CARE_PROVIDER_SITE_OTHER): Payer: Medicare HMO | Admitting: Internal Medicine

## 2017-12-20 ENCOUNTER — Encounter (INDEPENDENT_AMBULATORY_CARE_PROVIDER_SITE_OTHER): Payer: Self-pay | Admitting: Internal Medicine

## 2017-12-20 VITALS — BP 122/60 | HR 60 | Temp 97.0°F | Ht 61.0 in | Wt 124.2 lb

## 2017-12-20 DIAGNOSIS — I132 Hypertensive heart and chronic kidney disease with heart failure and with stage 5 chronic kidney disease, or end stage renal disease: Secondary | ICD-10-CM | POA: Diagnosis not present

## 2017-12-20 DIAGNOSIS — Z7982 Long term (current) use of aspirin: Secondary | ICD-10-CM | POA: Diagnosis not present

## 2017-12-20 DIAGNOSIS — I252 Old myocardial infarction: Secondary | ICD-10-CM | POA: Diagnosis not present

## 2017-12-20 DIAGNOSIS — I5032 Chronic diastolic (congestive) heart failure: Secondary | ICD-10-CM | POA: Diagnosis not present

## 2017-12-20 DIAGNOSIS — R140 Abdominal distension (gaseous): Secondary | ICD-10-CM | POA: Diagnosis not present

## 2017-12-20 DIAGNOSIS — N186 End stage renal disease: Secondary | ICD-10-CM | POA: Diagnosis not present

## 2017-12-20 DIAGNOSIS — R072 Precordial pain: Secondary | ICD-10-CM | POA: Diagnosis not present

## 2017-12-20 DIAGNOSIS — I2510 Atherosclerotic heart disease of native coronary artery without angina pectoris: Secondary | ICD-10-CM | POA: Diagnosis not present

## 2017-12-20 DIAGNOSIS — Z8543 Personal history of malignant neoplasm of ovary: Secondary | ICD-10-CM | POA: Diagnosis not present

## 2017-12-20 DIAGNOSIS — Z85038 Personal history of other malignant neoplasm of large intestine: Secondary | ICD-10-CM | POA: Diagnosis not present

## 2017-12-20 DIAGNOSIS — Z992 Dependence on renal dialysis: Secondary | ICD-10-CM | POA: Diagnosis not present

## 2017-12-20 LAB — CBC
HCT: 33.1 % — ABNORMAL LOW (ref 36.0–46.0)
Hemoglobin: 10.5 g/dL — ABNORMAL LOW (ref 12.0–15.0)
MCH: 33.1 pg (ref 26.0–34.0)
MCHC: 31.7 g/dL (ref 30.0–36.0)
MCV: 104.4 fL — ABNORMAL HIGH (ref 78.0–100.0)
PLATELETS: 196 10*3/uL (ref 150–400)
RBC: 3.17 MIL/uL — ABNORMAL LOW (ref 3.87–5.11)
RDW: 19.6 % — AB (ref 11.5–15.5)
WBC: 5.1 10*3/uL (ref 4.0–10.5)

## 2017-12-20 LAB — BASIC METABOLIC PANEL
Anion gap: 14 (ref 5–15)
BUN: 56 mg/dL — ABNORMAL HIGH (ref 6–20)
CALCIUM: 9.7 mg/dL (ref 8.9–10.3)
CO2: 27 mmol/L (ref 22–32)
CREATININE: 7.38 mg/dL — AB (ref 0.44–1.00)
Chloride: 99 mmol/L — ABNORMAL LOW (ref 101–111)
GFR calc non Af Amer: 5 mL/min — ABNORMAL LOW (ref >60)
GFR, EST AFRICAN AMERICAN: 5 mL/min — AB (ref >60)
Glucose, Bld: 109 mg/dL — ABNORMAL HIGH (ref 65–99)
Potassium: 4.4 mmol/L (ref 3.5–5.1)
SODIUM: 140 mmol/L (ref 135–145)

## 2017-12-20 LAB — I-STAT TROPONIN, ED
TROPONIN I, POC: 0.03 ng/mL (ref 0.00–0.08)
TROPONIN I, POC: 0.02 ng/mL (ref 0.00–0.08)

## 2017-12-20 NOTE — ED Provider Notes (Signed)
[facility] EMERGENCY DEPARTMENT Provider Note   CSN: [id number] Arrival date & time: 12/19/17  2244     History   Chief Complaint Chief Complaint  Patient presents with  . Chest Pain    HPI Shawna [name] is a 77 y.o. female.  The history is provided by the patient and the spouse.  Chest Pain   This is a new problem. The current episode started 6 to 12 hours ago. The problem occurs constantly. The problem has been gradually improving. The pain is moderate. Quality: Cramping. The pain does not radiate. Pertinent negatives include no abdominal pain, no diaphoresis, no shortness of breath and no vomiting. She has tried nitroglycerin (Aspirin) for the symptoms. The treatment provided moderate relief. Risk factors include being elderly.  Her past medical history is significant for CAD.  patient with history of extensive medical conditions.  She has a history of CAD, end-stage renal disease She reports several hours ago she had onset of left-sided chest pain.  She reports it is cramping.  She had no other associated symptoms.  She is now feeling improved. She Is due for dialysis later today.  Patient reports recent episode that was associated with food.  Nursing notes reviewed, and patient did not make any mention of using nitroglycerin and aspirin, and denies any radiation of chest pain on my evaluation Past Medical History:  Diagnosis Date  . Acute on chronic respiratory failure with hypoxia ([place]) 10/10/2016  . Anxiety   . Arthritis   . AVM (arteriovenous malformation) of colon   . CAD (coronary artery disease)    a. 12/2011 NSTEMI/Cath/PCI LCX (2.25x14 Resolute DES) & D1 (2.25x22 Resolute DES);  b. 01/2012 Cath/PCI: LM 30, LAD 30p, 40-34m, D1 stent ok, 99 in sm branch of diag, LCX patent stent, OM1 20, RCA 95 ost (4.0x12 Promus DES), EF 55%;  c. 04/2012 Lexi Cardiolite  EF 48%, small area of scar @ base/mid inflat wall with mild peri-infarct ischemia.; CABG 12/4  . Carotid artery  disease ([place])    a. 78-29% LICA, 11/6211   . Chronic bronchitis ([place])   . Chronic diastolic CHF (congestive heart failure) ([place])    a. 02/2012 Echo EF 60-65%, nl wall motion, Gr 1 DD, mod MR  . Colon cancer ([name]) 1992  . Esophageal stricture   . ESRD on hemodialysis ([place])    ESRD due to HTN, started dialysis 2011 and gets HD at [facility] with Dr Hinda Lenis on MWF schedule.  Access is LUA AVF as of Sept 2014.   Marland Kitchen GERD (gastroesophageal reflux disease)   . High cholesterol 12/2011  . History of blood transfusion 07/2011; 12/2011; 01/2012 X 2; 04/2012  . History of gout   . History of lower GI bleeding   . Hypertension   . Iron deficiency anemia   . Mitral regurgitation    a. Moderate by echo, 02/2012  . Myocardial infarction ([place])   . Ovarian cancer ([place]) 1992  . Pneumonia ~ 2009  . PUD (peptic ulcer disease)   . TIA (transient ischemic attack)     Patient Active Problem List   Diagnosis Date Noted  . Hand steal syndrome ([place]) 08/01/2017  . Anemia 07/14/2017  . Coronary artery disease of bypass graft of native heart with stable angina pectoris ([place]) 06/05/2017  . Mesenteric ischemia ([place])   . Diverticulitis   . SBO (small bowel obstruction) ([place]) 03/22/2017  . Enteritis   . Complication of vascular access for dialysis 03/19/2017  . Preoperative  clearance 01/25/2017  . Symptomatic anemia 10/24/2016  . H/O non-ST elevation myocardial infarction (NSTEMI) 10/24/2016  . Fluid overload 10/10/2016  . Complication from renal dialysis device 10/10/2016  . Non-ST elevation MI (NSTEMI) ([place])   . Encounter for fitting and adjustment of vascular catheter   . End stage renal disease ([place])   . ESRD (end stage renal disease) ([place])   . Heme positive stool   . Demand ischemia ([place]) 07/27/2016  . Hypertensive emergency 07/08/2016  . Acute on chronic respiratory failure with hypoxia ([place])   . Cardiac arrest ([place])   . Palliative care encounter   . Goals of care, counseling/discussion   .  Hypertensive crisis without congestive heart failure 05/09/2016  . Acute pulmonary edema ([place]) 04/06/2016  . Acute respiratory failure ([place]) 04/06/2016  . Hypertensive crisis 01/27/2016  . History of colon cancer 01/27/2016  . History of ovarian cancer 01/27/2016  . Hypertensive urgency 01/27/2016  . Narrow complex tachycardia ([place]) 09/08/2015  . SVT (supraventricular tachycardia) ([place]) 09/08/2015  . Influenza A 08/30/2015  . Acute on chronic diastolic CHF (congestive heart failure) ([place]) 05/04/2015  . Unstable angina ([place]) 05/03/2015  . Essential hypertension   . Pain in joint, lower leg 08/14/2014  . Chest pain 11/26/2013  . Small bowel obstruction, partial ([place]) 05/29/2013  . Chronic diastolic CHF (congestive heart failure) ([place]) 03/22/2013  . GI bleeding 03/21/2013  . Acute blood loss anemia 03/21/2013  . Vaginal odor 03/12/2013  . Vaginal discharge 03/12/2013  . Occlusion and stenosis of carotid artery without mention of cerebral infarction 01/24/2013  . Hx of CABG 07/05/2012  . Carotid artery disease ([place]) 07/05/2012  . Anemia of chronic kidney failure 07/03/2012  . Secondary hyperparathyroidism ([place]) 07/03/2012  . Mitral regurgitation 06/12/2012  . Pneumonia 06/09/2012  . Non-STEMI (non-ST elevated myocardial infarction) ([place]) 06/08/2012  . 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 GERD (gastroesophageal reflux disease) 01/09/2012  . HLD (hyperlipidemia) 01/05/2012  . Atherosclerosis of native coronary artery of native heart without angina pectoris 12/16/2011  . ESRD on hemodialysis ([place]) 12/16/2011    Past Surgical History:  Procedure Laterality Date  . ABDOMINAL HYSTERECTOMY  1992  . APPENDECTOMY  06/1990  . AV FISTULA PLACEMENT  07/2009   left upper arm  . AV FISTULA PLACEMENT Right 09/06/2016   Procedure: RIGHT FOREARM ARTERIOVENOUS (AV) GRAFT;  Surgeon: Elam Dutch, MD;  Location: [facility] OR;  Service: Vascular;  Laterality: Right;    . AV FISTULA PLACEMENT N/A 02/24/2017   Procedure: INSERTION OF ARTERIOVENOUS (AV) GORE-TEX GRAFT ARM (BRACHIAL AXILLARY);  Surgeon: Katha Cabal, MD;  Location: [facility] ORS;  Service: Vascular;  Laterality: N/A;  . [place] Right 09/06/2016   Procedure: REMOVAL OF Right Arm ARTERIOVENOUS GORETEX GRAFT and Vein Patch angioplasty of brachial artery;  Surgeon: Angelia Mould, MD;  Location: [place];  Service: Vascular;  Laterality: Right;  . COLON RESECTION  1992  . COLON SURGERY    . CORONARY ANGIOPLASTY WITH STENT PLACEMENT  12/15/11   "2"  . CORONARY ANGIOPLASTY WITH STENT PLACEMENT  y/2013   "1; makes total of 3" (05/02/2012)  . CORONARY ARTERY BYPASS GRAFT  06/13/2012   Procedure: CORONARY ARTERY BYPASS GRAFTING (CABG);  Surgeon: Grace Isaac, MD;  Location: [place];  Service: Open Heart Surgery;  Laterality: N/A;  cabg x four;  using left internal mammary artery, and left leg greater saphenous vein harvested endoscopically  . CORONARY STENT INTERVENTION N/A 10/13/2016   Procedure:  Coronary Stent Intervention;  Surgeon: Troy Sine, MD;  Location: [place] CV LAB;  Service: Cardiovascular;  Laterality: N/A;  . DIALYSIS/PERMA CATHETER REMOVAL N/A 04/18/2017   Procedure: DIALYSIS/PERMA CATHETER REMOVAL;  Surgeon: Katha Cabal, MD;  Location: [place] CV LAB;  Service: Cardiovascular;  Laterality: N/A;  . DILATION AND CURETTAGE OF UTERUS    . ESOPHAGOGASTRODUODENOSCOPY  01/20/2012   Procedure: ESOPHAGOGASTRODUODENOSCOPY (EGD);  Surgeon: Ladene Artist, MD,FACG;  Location: [facility] ENDOSCOPY;  Service: Endoscopy;  Laterality: N/A;  . ESOPHAGOGASTRODUODENOSCOPY N/A 03/26/2013   Procedure: ESOPHAGOGASTRODUODENOSCOPY (EGD);  Surgeon: Irene Shipper, MD;  Location: [facility] ENDOSCOPY;  Service: Endoscopy;  Laterality: N/A;  . ESOPHAGOGASTRODUODENOSCOPY N/A 04/30/2015   Procedure: ESOPHAGOGASTRODUODENOSCOPY (EGD);  Surgeon: Rogene Houston, MD;  Location: [facility]O [facility];  Service:  Endoscopy;  Laterality: N/A;  1pm - moved to 10/20 @ 1:10  . ESOPHAGOGASTRODUODENOSCOPY N/A 07/29/2016   Procedure: ESOPHAGOGASTRODUODENOSCOPY (EGD);  Surgeon: Manus Gunning, MD;  Location: [name];  Service: Gastroenterology;  Laterality: N/A;  enteroscopy  . INTRAOPERATIVE TRANSESOPHAGEAL ECHOCARDIOGRAM  06/13/2012   Procedure: INTRAOPERATIVE TRANSESOPHAGEAL ECHOCARDIOGRAM;  Surgeon: Grace Isaac, MD;  Location: [place];  Service: Open Heart Surgery;  Laterality: N/A;  . IR GENERIC HISTORICAL  07/26/2016   IR FLUORO GUIDE CV LINE RIGHT 07/26/2016 Greggory Keen, MD [facility]INTER[facility] RAD  . IR GENERIC HISTORICAL  07/26/2016   IR US GUIDE VASC ACCESS RIGHT 07/26/2016 Greggory Keen, MD [facility]  . IR GENERIC HISTORICAL  08/02/2016   IR US GUIDE VASC ACCESS RIGHT 08/02/2016 Greggory Keen, MD [facility]  . IR GENERIC HISTORICAL  08/02/2016   IR FLUORO GUIDE CV LINE RIGHT 08/02/2016 Greggory Keen, MD [facility]  . IR RADIOLOGY PERIPHERAL GUIDED IV START  03/28/2017  . IR US GUIDE VASC ACCESS RIGHT  03/28/2017  . LEFT HEART CATH AND CORONARY ANGIOGRAPHY N/A 09/20/2016   Procedure: Left Heart Cath and Coronary Angiography;  Surgeon: [name], MD;  Location: [place] CV LAB;  Service: Cardiovascular;  Laterality: N/A;  . LEFT HEART CATH AND CORS/GRAFTS ANGIOGRAPHY N/A 10/13/2016   Procedure: Left Heart Cath and Cors/Grafts Angiography;  Surgeon: Troy Sine, MD;  Location: [place] CV LAB;  Service: Cardiovascular;  Laterality: N/A;  . LEFT HEART CATHETERIZATION WITH CORONARY ANGIOGRAM N/A 12/15/2011   Procedure: LEFT HEART CATHETERIZATION WITH CORONARY ANGIOGRAM;  Surgeon: Burnell Blanks, MD;  Location: [facility] CATH LAB;  Service: Cardiovascular;  Laterality: N/A;  . LEFT HEART CATHETERIZATION WITH CORONARY ANGIOGRAM N/A 01/10/2012   Procedure: LEFT HEART CATHETERIZATION WITH CORONARY ANGIOGRAM;  Surgeon: Peter M Martinique, MD;  Location: [facility] CATH LAB;  Service: Cardiovascular;   Laterality: N/A;  . LEFT HEART CATHETERIZATION WITH CORONARY ANGIOGRAM N/A 06/08/2012   Procedure: LEFT HEART CATHETERIZATION WITH CORONARY ANGIOGRAM;  Surgeon: Burnell Blanks, MD;  Location: [facility] CATH LAB;  Service: Cardiovascular;  Laterality: N/A;  . LEFT HEART CATHETERIZATION WITH CORONARY/GRAFT ANGIOGRAM N/A 12/10/2013   Procedure: LEFT HEART CATHETERIZATION WITH Beatrix Fetters;  Surgeon: [name], MD;  Location: [facility] CATH LAB;  Service: Cardiovascular;  Laterality: N/A;  . OVARY SURGERY     ovarian cancer  . REVISION OF ARTERIOVENOUS GORETEX GRAFT N/A 02/24/2017   Procedure: REVISION OF ARTERIOVENOUS GORETEX GRAFT (RESECTION);  Surgeon: Katha Cabal, MD;  Location: [facility] ORS;  Service: Vascular;  Laterality: N/A;  Earney Mallet N/A 10/15/2013   Procedure: Fistulogram;  Surgeon: Serafina Mitchell, MD;  Location: [facility] CATH LAB;  Service: Cardiovascular;  Laterality:  N/A;  . THROMBECTOMY / ARTERIOVENOUS GRAFT REVISION  2011   left upper arm  . TUBAL LIGATION  1980's  . UPPER EXTREMITY ANGIOGRAPHY Bilateral 12/06/2016   Procedure: Upper Extremity Angiography;  Surgeon: Katha Cabal, MD;  Location: [place] CV LAB;  Service: Cardiovascular;  Laterality: Bilateral;  . UPPER EXTREMITY INTERVENTION Left 06/06/2017   Procedure: UPPER EXTREMITY INTERVENTION;  Surgeon: Katha Cabal, MD;  Location: [place] CV LAB;  Service: Cardiovascular;  Laterality: Left;     OB History    Gravida  2   Para  2   Term      Preterm  2   AB      Living  2     SAB      TAB      Ectopic      Multiple      Live Births               Home Medications    Prior to Admission medications   Medication Sig Start Date End Date Taking? Authorizing Provider  ALPRAZolam (XANAX) 0.25 MG tablet Take 1 tablet (0.25 mg total) by mouth 2 (two) times daily as needed for anxiety or sleep. 04/05/17  Yes Arrien, Jimmy Picket, MD  amiodarone (PACERONE) 200 MG tablet  Take 1 tablet (200 mg total) by mouth daily. 12/08/16  Yes Herminio Commons, MD  aspirin EC 81 MG tablet Take 81 mg by mouth once. FOR CHEST PAIN   Yes [provider]  bisacodyl (DULCOLAX) 10 MG suppository Place 1 suppository (10 mg total) rectally as needed for moderate constipation. 11/21/17  Yes Rehman, Mechele Dawley, MD  carvedilol (COREG) 6.25 MG tablet TAKE 1 TABLET BY MOUTH TWICE DAILY WITH  A  MEAL Patient taking differently: TAKE 1 TABLET (6.25 MG) BY MOUTH TWICE DAILY WITH  A  MEAL 06/15/17  Yes Herminio Commons, MD  cinacalcet (SENSIPAR) 30 MG tablet Take 30 mg by mouth daily after supper.   Yes [provider]  cloNIDine (CATAPRES) 0.2 MG tablet Take 0.1-0.2 mg by mouth daily as needed (high blood pressure).    Yes [provider]  diphenhydrAMINE (BENADRYL) 25 mg capsule Take 50 mg by mouth daily as needed for itching or allergies.    Yes [provider]  diphenoxylate-atropine (LOMOTIL) 2.5-0.025 MG tablet Take 1 tablet by mouth 4 (four) times daily as needed for diarrhea or loose stools. 08/13/17  Yes Tanna Furry, MD  epoetin alfa (EPOGEN,PROCRIT) 29924 UNIT/ML injection Inject 1 mL (10,000 Units total) into the vein every Monday, Wednesday, and Friday with hemodialysis. 04/11/16  Yes Orvan Falconer, MD  ferrous sulfate 325 (65 FE) MG EC tablet Take 1 tablet (325 mg total) by mouth 2 (two) times daily with a meal. Patient taking differently: Take 325 mg by mouth daily.  07/15/17 12/19/17 Yes Johnson, Clanford L, MD  fluticasone (FLONASE) 50 MCG/ACT nasal spray Place 1 spray at bedtime as needed into both nostrils for allergies.    Yes [provider]  guaiFENesin (ROBITUSSIN) 100 MG/5ML SOLN Take 5 mLs (100 mg total) by mouth every 6 (six) hours as needed for cough or to loosen phlegm. 10/16/17  Yes Arrien, Jimmy Picket, MD  hydrALAZINE (APRESOLINE) 50 MG tablet Take 50 mg by mouth 2 (two) times daily as needed (high blood pressure).   Yes [provider]  isosorbide mononitrate (IMDUR) 120 MG 24 hr tablet TAKE 1 TABLET BY MOUTH ONCE DAILY 12/01/17  Yes  Herminio Commons, MD  lidocaine-prilocaine (EMLA) cream Apply 1 application topically every Monday, Wednesday, and Friday. Prior to dialysis   Yes [provider]  loratadine (CLARITIN) 10 MG tablet Take 10 mg by mouth daily as needed for allergies.   Yes [provider]  multivitamin (RENA-VIT) TABS tablet Take 1 tablet by mouth daily.   Yes [provider]  nitroGLYCERIN (NITROSTAT) 0.4 MG SL tablet Place 1 tablet (0.4 mg total) under the tongue every 5 (five) minutes as needed for chest pain. 06/05/17  Yes Herminio Commons, MD  omeprazole (PRILOSEC) 20 MG capsule Take 1 capsule (20 mg total) by mouth daily. 11/21/17  Yes Rehman, Mechele Dawley, MD  ondansetron (ZOFRAN-ODT) 4 MG disintegrating tablet Take 4 mg by mouth every 8 (eight) hours as needed for nausea or vomiting.  07/12/17  Yes [provider]  sevelamer carbonate (RENVELA) 800 MG tablet Take 800-2,400 mg by mouth See admin instructions. Take 3 tablets (2400) by mouth twice daily with meals, take 1 tablet (800 mg) with snacks   Yes [provider]  simvastatin (ZOCOR) 20 MG tablet TAKE ONE TABLET BY MOUTH AT BEDTIME Patient taking differently: TAKE ONE TABLET (20 MG) BY MOUTH DAILY AT BEDTIME 02/13/17  Yes Herminio Commons, MD  ticagrelor (BRILINTA) 60 MG TABS tablet Take 1 tablet (60 mg total) by mouth 2 (two) times daily. 11/08/17  Yes Herminio Commons, MD  escitalopram (LEXAPRO) 10 MG tablet  11/16/17   [provider]  Wheat Dextrin (BENEFIBER DRINK MIX) PACK Take 4 g by mouth at bedtime. Patient not taking: Reported on 12/19/2017 11/21/17   Rogene Houston, MD    Family History Family History  Problem Relation Age of Onset  . Heart disease Mother        Heart Disease before age 26  . Hyperlipidemia Mother   . Hypertension Mother   . Diabetes Mother   . Heart  attack Mother   . Heart disease Father        Heart Disease before age 48  . Hyperlipidemia Father   . Hypertension Father   . Diabetes Father   . Diabetes Sister   . Hypertension Sister   . Diabetes Brother   . Hyperlipidemia Brother   . Heart attack Brother   . Hypertension Sister   . Heart attack Brother   . Other Unknown        noncontributory for early CAD  . Colon cancer Neg Hx   . Esophageal cancer Neg Hx   . Liver disease Neg Hx   . Kidney disease Neg Hx   . Colon polyps Neg Hx     Social History Social History   Tobacco Use  . Smoking status: Never Smoker  . Smokeless tobacco: Never Used  Substance Use Topics  . Alcohol use: No    Alcohol/week: 0.0 oz  . Drug use: No     Allergies   Aspirin; Penicillins; Amlodipine; Bactrim [sulfamethoxazole-trimethoprim]; Contrast media [iodinated diagnostic agents]; Iron; Nitrofurantoin; Tylenol [acetaminophen]; Gabapentin; Dexilant [dexlansoprazole]; Levaquin [levofloxacin in d5w]; Morphine and related; Plavix [clopidogrel bisulfate]; Protonix [pantoprazole sodium]; and Venofer [ferric oxide]   Review of Systems Review of Systems  Constitutional: Negative for diaphoresis.  Respiratory: Negative for shortness of breath.   Cardiovascular: Positive for chest pain.  Gastrointestinal: Negative for abdominal pain and vomiting.  All other systems reviewed and are negative.    Physical Exam Updated Vital Signs BP (!) 175/68   Pulse (!) 50  Resp (!) 22   Ht 1.575 m (5\' 2" )   Wt 56.7 kg (125 lb)   SpO2 96%   BMI 22.86 kg/m   Physical Exam CONSTITUTIONAL: Elderly, no acute distress HEAD: Normocephalic/atraumatic EYES: EOMI ENMT: Mucous membranes moist NECK: supple no meningeal signs SPINE/BACK:entire spine nontender CV: S1/S2 noted, murmur noted LUNGS: brief sounding Crackles in bilateral bases no apparent distress ABDOMEN: soft, nontender  NEURO: Pt is awake/alert/appropriate, moves all extremitiesx4.  No  facial droop.   EXTREMITIES: pulses normal/equal, full ROM, no calf tenderness or edema SKIN: warm, color normal PSYCH: no abnormalities of mood noted, alert and oriented to situation   ED Treatments / Results  Labs (all labs ordered are listed, but only abnormal results are displayed) Labs Reviewed  BASIC METABOLIC PANEL - Abnormal; Notable for the following components:      Result Value   Chloride 99 (*)    Glucose, Bld 109 (*)    BUN 56 (*)    Creatinine, Ser 7.38 (*)    GFR calc non Af Amer 5 (*)    GFR calc Af Amer 5 (*)    All other components within normal limits  CBC - Abnormal; Notable for the following components:   RBC 3.17 (*)    Hemoglobin 10.5 (*)    HCT 33.1 (*)    MCV 104.4 (*)    RDW 19.6 (*)    All other components within normal limits  I-STAT TROPONIN, ED  I-STAT TROPONIN, ED    EKG EKG Interpretation  Date/Time:  Tuesday December 19 2017 22:54:36 EDT Ventricular Rate:  55 PR Interval:    QRS Duration: 118 QT Interval:  500 QTC Calculation: 479 R Axis:   32 Text Interpretation:  Sinus rhythm LVH with secondary repolarization abnormality No significant change since last tracing Confirmed by Ripley Fraise [phone]) on 12/19/2017 11:43:26 PM   Radiology Dg Chest 2 View  Result Date: 12/19/2017 CLINICAL DATA:  Left-sided chest pain radiating into the back. EXAM: CHEST - 2 VIEW COMPARISON:  Most recent radiograph 10/15/2017. FINDINGS: Post median sternotomy. Increased cardiomegaly from prior exam. There is aortic atherosclerosis. Chronic peribronchial thickening without change from prior. Possible small pleural effusions. Scarring in the left mid lung and right lung base, unchanged. No new focal airspace disease. No pneumothorax. IMPRESSION: Cardiomegaly with small pleural effusions. Bronchial thickening appears chronic and unchanged. Electronically Signed   By: Jeb Levering M.D.   On: 12/19/2017 23:33    Procedures Procedures (including critical care  time)  Medications Ordered in ED Medications - No data to display   Initial Impression / Assessment and Plan / ED Course  I have reviewed the triage vital signs and the nursing notes.  Pertinent labs & imaging results that were available during my care of the patient were reviewed by me and considered in my medical decision making (see chart for details).     1:24 AM Patient with history of CAD and end-stage renal disease presents with chest pain.  On my evaluation, she reports chest pain is cramping, and her history is very atypical.  She had a recent admission for chest pain and ruled out for MI.  EKG today is unchanged.  She reports she would like to go to go to dialysis later today she would not like to wait too long.  Out of respect for her time, will repeat troponin at 2 AM which is less than 3 hours since prior If negative then will discharge home   After  monitoring in the ER, patient is improved.  She was sleeping on repeat assessment.  No further episodes of chest pain.  EKG is unchanged.  Repeat troponin is negative.  She feels comfortable with discharge.  Discussed with patient and husband, will discharge home  Final Clinical Impressions(s) / ED Diagnoses   Final diagnoses:  Precordial pain    ED Discharge Orders    None       Ripley Fraise, MD 12/20/17 [phone]

## 2017-12-20 NOTE — Progress Notes (Addendum)
Subjective:    Patient ID: Shawna [name], female    DOB: 1941/06/12, 77 y.o.   MRN: [id number]  HPI  Here today for f/u. She was seen in the ED yesterday at [facility].  C/o left sided chest pain that start 6-12 hrs before going to ED.  There was no abdominal pain, no SOB and no vomiting. She tried NTG and ASA which provided moderate relief.  Her EKG was unchanged per records. Troponin was normal. She was discharged home. She c/o today of bloating.  She has a CT scan scheduled 12/28/2017 by Dr. [name] for spots on her kidneys.  She underwent an EGD in January of 2018 . (Anemia secondary to blood loss). Dr. Havery Moros.  Her appetite is okay. No weight loss. She has a BM daily. No melena or BRRB.  She is maintaining her Hemoglobin.  Last colonoscopy in 2013 by Dr. Britta Mccreedy, Heme positive stool, anemia.  Two polyps in cecum removed. Moderate diverticulosis thru out the colon. Surgical anastomosis, patent, involving the DC to rectum. Sessile polyp in the descending colon. All polyps were tubular adenoma.   CBC    Component Value Date/Time   WBC 5.1 12/20/2017 0026   RBC 3.17 (L) 12/20/2017 0026   HGB 10.5 (L) 12/20/2017 0026   HCT 33.1 (L) 12/20/2017 0026   PLT 196 12/20/2017 0026   MCV 104.4 (H) 12/20/2017 0026   MCH 33.1 12/20/2017 0026   MCHC 31.7 12/20/2017 0026   RDW 19.6 (H) 12/20/2017 0026   LYMPHSABS 1.1 10/15/2017 0420   MONOABS 0.3 10/15/2017 0420   EOSABS 0.3 10/15/2017 0420   BASOSABS 0.1 10/15/2017 0420       Dialysis M-W-F Hx of CAD and ESRD.  Hx of MI  With stents in the past. Maintained on Brilinta  Review of Systems Past Medical History:  Diagnosis Date  . Acute on chronic respiratory failure with hypoxia ([place]) 10/10/2016  . Anxiety   . Arthritis   . AVM (arteriovenous malformation) of colon   . CAD (coronary artery disease)    a. 12/2011 NSTEMI/Cath/PCI LCX (2.25x14 Resolute DES) & D1 (2.25x22 Resolute DES);  b. 01/2012 Cath/PCI: LM 30, LAD 30p, 40-11m, D1 stent  ok, 99 in sm branch of diag, LCX patent stent, OM1 20, RCA 95 ost (4.0x12 Promus DES), EF 55%;  c. 04/2012 Lexi Cardiolite  EF 48%, small area of scar @ base/mid inflat wall with mild peri-infarct ischemia.; CABG 12/4  . Carotid artery disease ([place])    a. 95-63% LICA, 02/7563   . Chronic bronchitis ([place])   . Chronic diastolic CHF (congestive heart failure) ([place])    a. 02/2012 Echo EF 60-65%, nl wall motion, Gr 1 DD, mod MR  . Colon cancer ([place]) 1992  . Esophageal stricture   . ESRD on hemodialysis ([place])    ESRD due to HTN, started dialysis 2011 and gets HD at [facility] with Dr Hinda Lenis on MWF schedule.  Access is LUA AVF as of Sept 2014.   Marland Kitchen GERD (gastroesophageal reflux disease)   . High cholesterol 12/2011  . History of blood transfusion 07/2011; 12/2011; 01/2012 X 2; 04/2012  . History of gout   . History of lower GI bleeding   . Hypertension   . Iron deficiency anemia   . Mitral regurgitation    a. Moderate by echo, 02/2012  . Myocardial infarction ([place])   . Ovarian cancer ([place]) 1992  . Pneumonia ~ 2009  . PUD (peptic ulcer disease)   .  TIA (transient ischemic attack)     Past Surgical History:  Procedure Laterality Date  . ABDOMINAL HYSTERECTOMY  1992  . APPENDECTOMY  06/1990  . AV FISTULA PLACEMENT  07/2009   left upper arm  . AV FISTULA PLACEMENT Right 09/06/2016   Procedure: RIGHT FOREARM ARTERIOVENOUS (AV) GRAFT;  Surgeon: Elam Dutch, MD;  Location: [facility] OR;  Service: Vascular;  Laterality: Right;  . AV FISTULA PLACEMENT N/A 02/24/2017   Procedure: INSERTION OF ARTERIOVENOUS (AV) GORE-TEX GRAFT ARM (BRACHIAL AXILLARY);  Surgeon: Katha Cabal, MD;  Location: [facility] ORS;  Service: Vascular;  Laterality: N/A;  . [place] Right 09/06/2016   Procedure: REMOVAL OF Right Arm ARTERIOVENOUS GORETEX GRAFT and Vein Patch angioplasty of brachial artery;  Surgeon: Angelia Mould, MD;  Location: [place];  Service: Vascular;  Laterality: Right;  . COLON RESECTION  1992  .  COLON SURGERY    . CORONARY ANGIOPLASTY WITH STENT PLACEMENT  12/15/11   "2"  . CORONARY ANGIOPLASTY WITH STENT PLACEMENT  y/2013   "1; makes total of 3" (05/02/2012)  . CORONARY ARTERY BYPASS GRAFT  06/13/2012   Procedure: CORONARY ARTERY BYPASS GRAFTING (CABG);  Surgeon: Grace Isaac, MD;  Location: [place];  Service: Open Heart Surgery;  Laterality: N/A;  cabg x four;  using left internal mammary artery, and left leg greater saphenous vein harvested endoscopically  . CORONARY STENT INTERVENTION N/A 10/13/2016   Procedure: Coronary Stent Intervention;  Surgeon: Troy Sine, MD;  Location: [place] CV LAB;  Service: Cardiovascular;  Laterality: N/A;  . DIALYSIS/PERMA CATHETER REMOVAL N/A 04/18/2017   Procedure: DIALYSIS/PERMA CATHETER REMOVAL;  Surgeon: Katha Cabal, MD;  Location: [place] CV LAB;  Service: Cardiovascular;  Laterality: N/A;  . DILATION AND CURETTAGE OF UTERUS    . ESOPHAGOGASTRODUODENOSCOPY  01/20/2012   Procedure: ESOPHAGOGASTRODUODENOSCOPY (EGD);  Surgeon: Ladene Artist, MD,FACG;  Location: [facility] ENDOSCOPY;  Service: Endoscopy;  Laterality: N/A;  . ESOPHAGOGASTRODUODENOSCOPY N/A 03/26/2013   Procedure: ESOPHAGOGASTRODUODENOSCOPY (EGD);  Surgeon: Irene Shipper, MD;  Location: [facility] ENDOSCOPY;  Service: Endoscopy;  Laterality: N/A;  . ESOPHAGOGASTRODUODENOSCOPY N/A 04/30/2015   Procedure: ESOPHAGOGASTRODUODENOSCOPY (EGD);  Surgeon: Rogene Houston, MD;  Location: [facility] ENDO SUITE;  Service: Endoscopy;  Laterality: N/A;  1pm - moved to 10/20 @ 1:10  . ESOPHAGOGASTRODUODENOSCOPY N/A 07/29/2016   Procedure: ESOPHAGOGASTRODUODENOSCOPY (EGD);  Surgeon: Manus Gunning, MD;  Location: [place];  Service: Gastroenterology;  Laterality: N/A;  enteroscopy  . INTRAOPERATIVE TRANSESOPHAGEAL ECHOCARDIOGRAM  06/13/2012   Procedure: INTRAOPERATIVE TRANSESOPHAGEAL ECHOCARDIOGRAM;  Surgeon: Grace Isaac, MD;  Location: [place];  Service: Open Heart Surgery;  Laterality: N/A;    . IR GENERIC HISTORICAL  07/26/2016   IR FLUORO GUIDE CV LINE RIGHT 07/26/2016 Greggory Keen, MD [facility]INTER[facility]  . IR GENERIC HISTORICAL  07/26/2016   IR US GUIDE VASC ACCESS RIGHT 07/26/2016 Greggory Keen, MD [facility]  . IR GENERIC HISTORICAL  08/02/2016   IR US GUIDE VASC ACCESS RIGHT 08/02/2016 Greggory Keen, MD [facility]  . IR GENERIC HISTORICAL  08/02/2016   IR FLUORO GUIDE CV LINE RIGHT 08/02/2016 Greggory Keen, MD [facility]  . IR RADIOLOGY PERIPHERAL GUIDED IV START  03/28/2017  . IR US GUIDE VASC ACCESS RIGHT  03/28/2017  . LEFT HEART CATH AND CORONARY ANGIOGRAPHY N/A 09/20/2016   Procedure: Left Heart Cath and Coronary Angiography;  Surgeon: Shawna Crome, MD;  Location: [place] CV LAB;  Service: Cardiovascular;  Laterality: N/A;  . LEFT  HEART CATH AND CORS/GRAFTS ANGIOGRAPHY N/A 10/13/2016   Procedure: Left Heart Cath and Cors/Grafts Angiography;  Surgeon: Troy Sine, MD;  Location: [place] CV LAB;  Service: Cardiovascular;  Laterality: N/A;  . LEFT HEART CATHETERIZATION WITH CORONARY ANGIOGRAM N/A 12/15/2011   Procedure: LEFT HEART CATHETERIZATION WITH CORONARY ANGIOGRAM;  Surgeon: Burnell Blanks, MD;  Location: [facility] CATH LAB;  Service: Cardiovascular;  Laterality: N/A;  . LEFT HEART CATHETERIZATION WITH CORONARY ANGIOGRAM N/A 01/10/2012   Procedure: LEFT HEART CATHETERIZATION WITH CORONARY ANGIOGRAM;  Surgeon: Peter M Martinique, MD;  Location: [facility] CATH LAB;  Service: Cardiovascular;  Laterality: N/A;  . LEFT HEART CATHETERIZATION WITH CORONARY ANGIOGRAM N/A 06/08/2012   Procedure: LEFT HEART CATHETERIZATION WITH CORONARY ANGIOGRAM;  Surgeon: Burnell Blanks, MD;  Location: [facility] CATH LAB;  Service: Cardiovascular;  Laterality: N/A;  . LEFT HEART CATHETERIZATION WITH CORONARY/GRAFT ANGIOGRAM N/A 12/10/2013   Procedure: LEFT HEART CATHETERIZATION WITH Beatrix Fetters;  Surgeon: [name], MD;  Location: [facility] CATH LAB;  Service: Cardiovascular;  Laterality:  N/A;  . OVARY SURGERY     ovarian cancer  . REVISION OF ARTERIOVENOUS GORETEX GRAFT N/A 02/24/2017   Procedure: REVISION OF ARTERIOVENOUS GORETEX GRAFT (RESECTION);  Surgeon: Katha Cabal, MD;  Location: [facility] ORS;  Service: Vascular;  Laterality: N/A;  [place] N/A 10/15/2013   Procedure: Fistulogram;  Surgeon: Serafina Mitchell, MD;  Location: [facility] CATH LAB;  Service: Cardiovascular;  Laterality: N/A;  . THROMBECTOMY / ARTERIOVENOUS GRAFT REVISION  2011   left upper arm  . TUBAL LIGATION  1980's  . UPPER EXTREMITY ANGIOGRAPHY Bilateral 12/06/2016   Procedure: Upper Extremity Angiography;  Surgeon: Katha Cabal, MD;  Location: [place] CV LAB;  Service: Cardiovascular;  Laterality: Bilateral;  . UPPER EXTREMITY INTERVENTION Left 06/06/2017   Procedure: UPPER EXTREMITY INTERVENTION;  Surgeon: Katha Cabal, MD;  Location: [place] CV LAB;  Service: Cardiovascular;  Laterality: Left;    Allergies  Allergen Reactions  . Aspirin Other (See Comments)    High Doses Mess up her stomach; "makes my bowels have blood in them". Takes 81 mg EC Aspirin   . Penicillins Other (See Comments)    SYNCOPE? , "makes me real weak when I take it; like I'll pass out"  Has patient had a PCN reaction causing immediate rash, facial/tongue/throat swelling, SOB or lightheadedness with hypotension: Yes Has patient had a PCN reaction causing severe rash involving mucus membranes or skin necrosis: no Has patient had a PCN reaction that required hospitalization no Has patient had a PCN reaction occurring within the last 10 years: no If all of the above  . Amlodipine Swelling  . Bactrim [Sulfamethoxazole-Trimethoprim] Rash  . Contrast Media [Iodinated Diagnostic Agents] Itching  . Iron Itching and Other (See Comments)    "they gave me iron in dialysis; had to give me Benadryl cause I had to have the iron" (05/02/2012)  . Nitrofurantoin Hives  . Tylenol [Acetaminophen] Itching and Other (See  Comments)    Makes her feet on fire per pt  . Gabapentin Other (See Comments)    Unknown reaction  . Dexilant [Dexlansoprazole] Other (See Comments)    Upset stomach  . Levaquin [Levofloxacin In D5w] Rash  . Morphine And Related Itching and Other (See Comments)    Itching in feet  . Plavix [Clopidogrel Bisulfate] Rash  . Protonix [Pantoprazole Sodium] Rash  . Venofer [Ferric Oxide] Itching and Other (See Comments)    Patient reports using Benadryl prior to doses  as [name]    Current Outpatient Medications on File Prior to Visit  Medication Sig Dispense Refill  . ALPRAZolam (XANAX) 0.25 MG tablet Take 1 tablet (0.25 mg total) by mouth 2 (two) times daily as needed for anxiety or sleep. 10 tablet 0  . amiodarone (PACERONE) 200 MG tablet Take 1 tablet (200 mg total) by mouth daily. 30 tablet 3  . aspirin EC 81 MG tablet Take 81 mg by mouth once. FOR CHEST PAIN    . bisacodyl (DULCOLAX) 10 MG suppository Place 1 suppository (10 mg total) rectally as needed for moderate constipation. 12 suppository 0  . carvedilol (COREG) 6.25 MG tablet TAKE 1 TABLET BY MOUTH TWICE DAILY WITH  A  MEAL (Patient taking differently: TAKE 1 TABLET (6.25 MG) BY MOUTH TWICE DAILY WITH  A  MEAL) 180 tablet 3  . cinacalcet (SENSIPAR) 30 MG tablet Take 30 mg by mouth daily after supper.    . cloNIDine (CATAPRES) 0.2 MG tablet Take 0.1-0.2 mg by mouth daily as needed (high blood pressure).     . diphenhydrAMINE (BENADRYL) 25 mg capsule Take 50 mg by mouth daily as needed for itching or allergies.     . diphenoxylate-atropine (LOMOTIL) 2.5-0.025 MG tablet Take 1 tablet by mouth 4 (four) times daily as needed for diarrhea or loose stools. 30 tablet 0  . epoetin alfa (EPOGEN,PROCRIT) 44010 UNIT/ML injection Inject 1 mL (10,000 Units total) into the vein every Monday, Wednesday, and Friday with hemodialysis. 1 mL 10  . escitalopram (LEXAPRO) 10 MG tablet     . fluticasone (FLONASE) 50 MCG/ACT nasal spray Place 1  spray at bedtime as needed into both nostrils for allergies.     Marland Kitchen guaiFENesin (ROBITUSSIN) 100 MG/5ML SOLN Take 5 mLs (100 mg total) by mouth every 6 (six) hours as needed for cough or to loosen phlegm. 1200 mL 0  . hydrALAZINE (APRESOLINE) 50 MG tablet Take 50 mg by mouth 2 (two) times daily as needed (high blood pressure).    . isosorbide mononitrate (IMDUR) 120 MG 24 hr tablet TAKE 1 TABLET BY MOUTH ONCE DAILY 30 tablet 6  . lidocaine-prilocaine (EMLA) cream Apply 1 application topically every Monday, Wednesday, and Friday. Prior to dialysis    . loratadine (CLARITIN) 10 MG tablet Take 10 mg by mouth daily as needed for allergies.    . multivitamin (RENA-VIT) TABS tablet Take 1 tablet by mouth daily.    . nitroGLYCERIN (NITROSTAT) 0.4 MG SL tablet Place 1 tablet (0.4 mg total) under the tongue every 5 (five) minutes as needed for chest pain. 25 tablet 3  . omeprazole (PRILOSEC) 20 MG capsule Take 1 capsule (20 mg total) by mouth daily. 90 capsule 3  . ondansetron (ZOFRAN-ODT) 4 MG disintegrating tablet Take 4 mg by mouth every 8 (eight) hours as needed for nausea or vomiting.     . sevelamer carbonate (RENVELA) 800 MG tablet Take 800-2,400 mg by mouth See admin instructions. Take 3 tablets (2400) by mouth twice daily with meals, take 1 tablet (800 mg) with snacks    . simvastatin (ZOCOR) 20 MG tablet TAKE ONE TABLET BY MOUTH AT BEDTIME (Patient taking differently: TAKE ONE TABLET (20 MG) BY MOUTH DAILY AT BEDTIME) 90 tablet 3  . ticagrelor (BRILINTA) 60 MG TABS tablet Take 1 tablet (60 mg total) by mouth 2 (two) times daily. 60 tablet 3  . Wheat Dextrin (BENEFIBER DRINK MIX) PACK Take 4 g by mouth at bedtime.    . ferrous  sulfate 325 (65 FE) MG EC tablet Take 1 tablet (325 mg total) by mouth 2 (two) times daily with a meal. (Patient taking differently: Take 325 mg by mouth daily. ) 60 tablet 0   No current facility-administered medications on file prior to visit.         Objective:    Physical Exam Blood pressure 122/60, pulse 60, temperature (!) 97 F (36.1 C), height 5\' 1"  (1.549 m), weight 124 lb 3.2 oz (56.3 kg). Alert and oriented. Skin warm and dry. Oral mucosa is moist.   . Sclera anicteric, conjunctivae is pink. Thyroid not enlarged. No cervical lymphadenopathy. Lungs clear. Heart regular rate and rhythm.  Abdomen is soft. Bowel sounds are positive. No hepatomegaly. No abdominal masses felt. No tenderness.  No edema to lower extremities.          Assessment & Plan:  Bloating. She will continue the Omeprazole.  May try Simethicone for the bloating., She is scheduled for a CT abdomen/pelvis on 12/28/2017.

## 2017-12-20 NOTE — Patient Instructions (Addendum)
Try taking Simethicone for the bloating.

## 2017-12-20 NOTE — Discharge Instructions (Addendum)
Your caregiver has diagnosed you as having chest pain that is not specific for one problem, but does not require admission.  Chest pain comes from many different causes.  °SEEK IMMEDIATE MEDICAL ATTENTION IF: °You have severe chest pain, especially if the pain is crushing or pressure-like and spreads to the arms, back, neck, or jaw, or if you have sweating, nausea (feeling sick to your stomach), or shortness of breath. THIS IS AN EMERGENCY. Don't wait to see if the pain will go away. Get medical help at once. Call 911 or 0 (operator). DO NOT drive yourself to the hospital.  °Your chest pain gets worse and does not go away with rest.  °You have an attack of chest pain lasting longer than usual, despite rest and treatment with the medications your caregiver has prescribed.  °You wake from sleep with chest pain or shortness of breath.  °You feel dizzy or faint.  °You have chest pain not typical of your usual pain for which you originally saw your caregiver. ° °

## 2017-12-21 DIAGNOSIS — Z992 Dependence on renal dialysis: Secondary | ICD-10-CM | POA: Diagnosis not present

## 2017-12-21 DIAGNOSIS — N186 End stage renal disease: Secondary | ICD-10-CM | POA: Diagnosis not present

## 2017-12-22 DIAGNOSIS — Z992 Dependence on renal dialysis: Secondary | ICD-10-CM | POA: Diagnosis not present

## 2017-12-22 DIAGNOSIS — N186 End stage renal disease: Secondary | ICD-10-CM | POA: Diagnosis not present

## 2017-12-25 DIAGNOSIS — N186 End stage renal disease: Secondary | ICD-10-CM | POA: Diagnosis not present

## 2017-12-25 DIAGNOSIS — Z992 Dependence on renal dialysis: Secondary | ICD-10-CM | POA: Diagnosis not present

## 2017-12-27 DIAGNOSIS — N186 End stage renal disease: Secondary | ICD-10-CM | POA: Diagnosis not present

## 2017-12-27 DIAGNOSIS — Z992 Dependence on renal dialysis: Secondary | ICD-10-CM | POA: Diagnosis not present

## 2017-12-28 ENCOUNTER — Telehealth: Payer: Self-pay | Admitting: Cardiovascular Disease

## 2017-12-28 ENCOUNTER — Ambulatory Visit (HOSPITAL_COMMUNITY)
Admission: RE | Admit: 2017-12-28 | Discharge: 2017-12-28 | Disposition: A | Discharge status: Home / Self Care | Payer: Medicare HMO | Source: Ambulatory Visit | Attending: Urology | Admitting: Urology

## 2017-12-28 DIAGNOSIS — D3001 Benign neoplasm of right kidney: Secondary | ICD-10-CM | POA: Diagnosis not present

## 2017-12-28 DIAGNOSIS — N261 Atrophy of kidney (terminal): Secondary | ICD-10-CM | POA: Diagnosis not present

## 2017-12-28 DIAGNOSIS — N281 Cyst of kidney, acquired: Secondary | ICD-10-CM | POA: Insufficient documentation

## 2017-12-28 DIAGNOSIS — I700 Atherosclerosis of aorta: Secondary | ICD-10-CM | POA: Insufficient documentation

## 2017-12-28 IMAGING — CT CT ABD-PELV W/O CM
2 of 5 series · 15 of 46 positions shown, 17 images · non-contrast
Comparison: None.

CLINICAL DATA: Indeterminate renal lesions renal lesion identified
on CT [DATE].

EXAM:
CT ABDOMEN AND PELVIS WITHOUT CONTRAST
TECHNIQUE: Multidetector CT imaging of the abdomen and pelvis was performed
following the standard protocol without IV contrast.

[Series 5: coronal st · coronal · 0.70mm/px · 3 of 87 slices shown]
[im 29/87  soft-tissue]
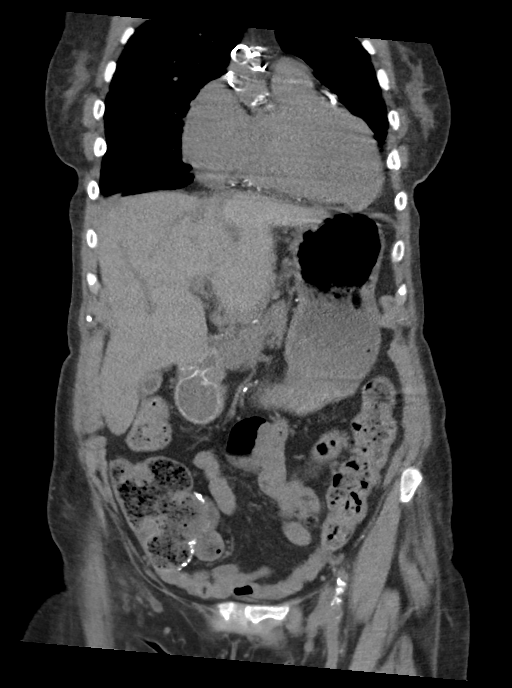
[im 39/87  soft-tissue]
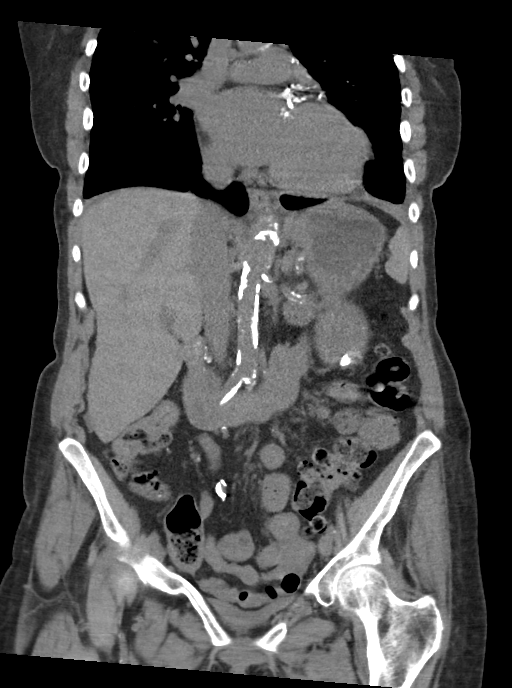
[im 48/87  soft-tissue]
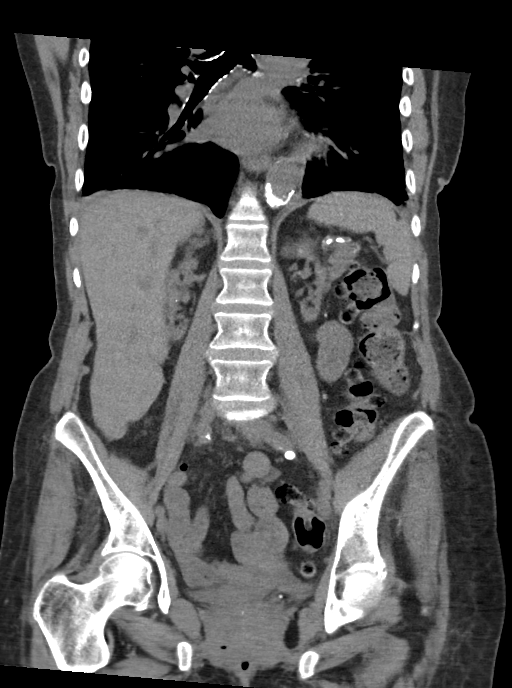

[Series 7: axial st · axial · 0.64mm/px · z∈[-303,+47]mm · 12 of 84 slices shown, 14 images]
[im 7/84  soft-tissue]
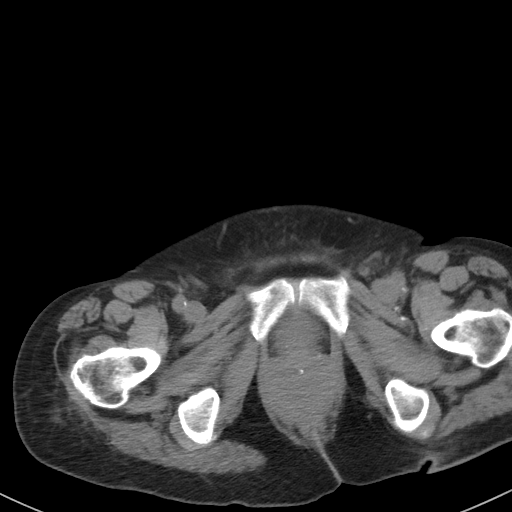
[im 7/84  bone]
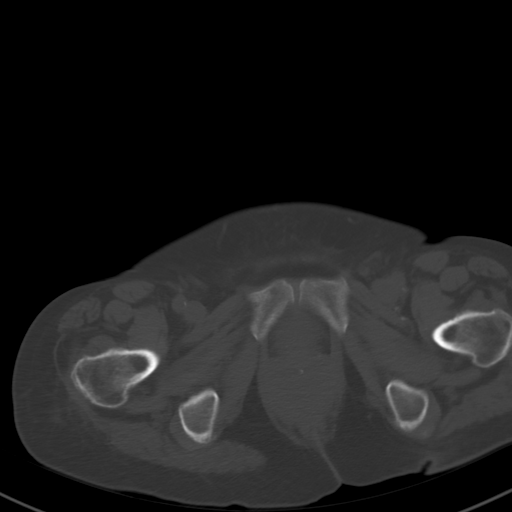
[im 13/84  soft-tissue]
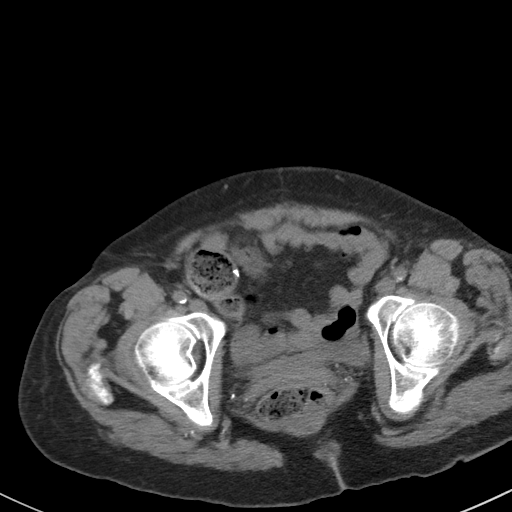
[im 20/84  soft-tissue]
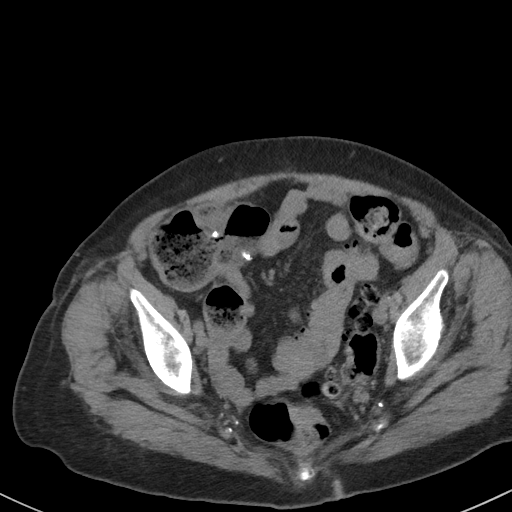
[im 26/84  soft-tissue]
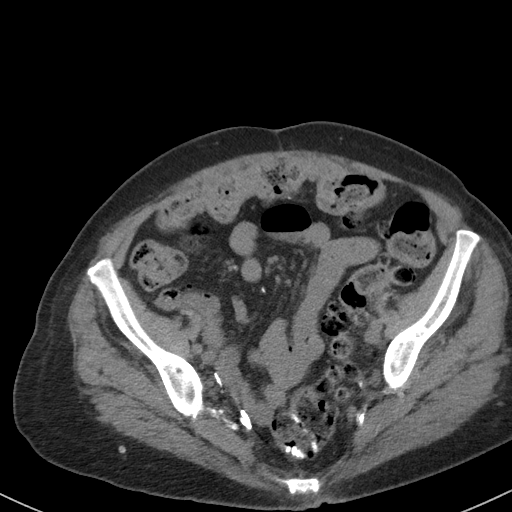
[im 32/84  soft-tissue]
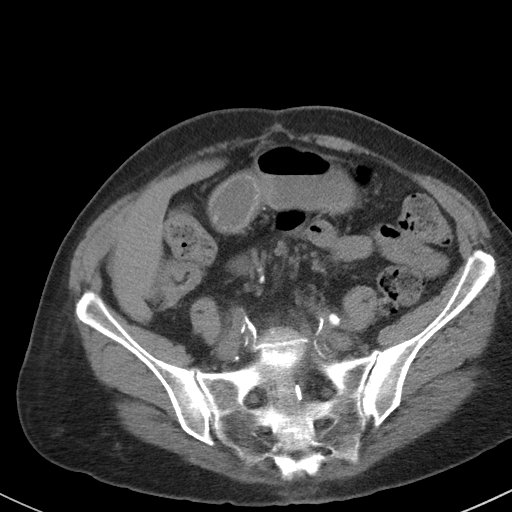
[im 39/84  soft-tissue]
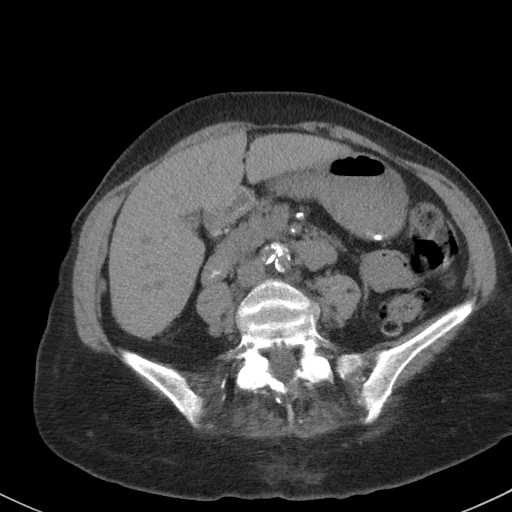
[im 45/84  soft-tissue]
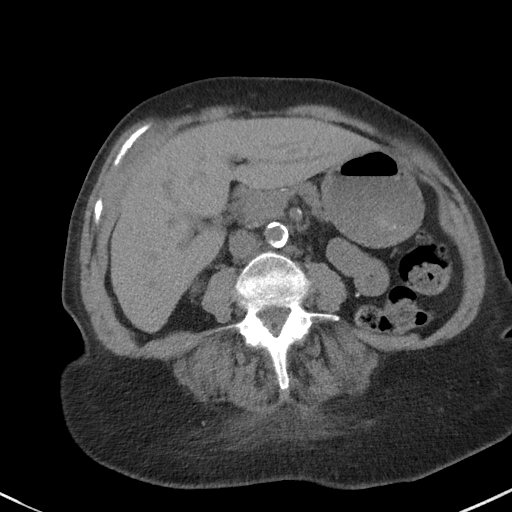
[im 52/84  soft-tissue]
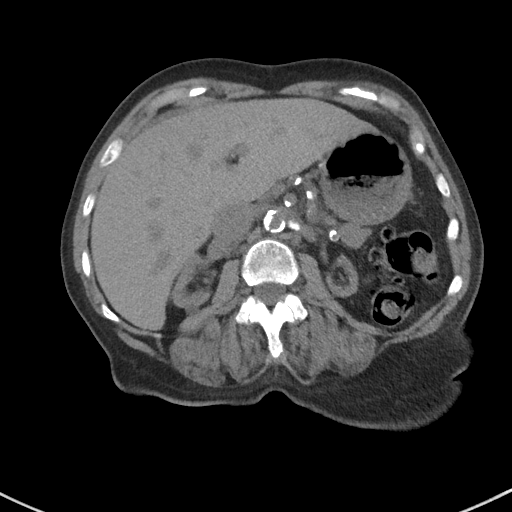
[im 58/84  soft-tissue]
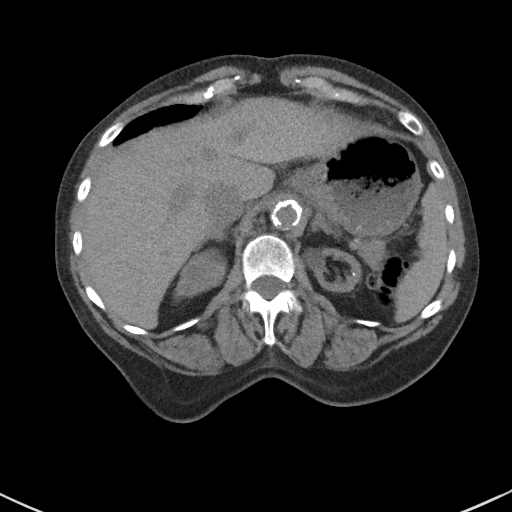
[im 58/84  bone]
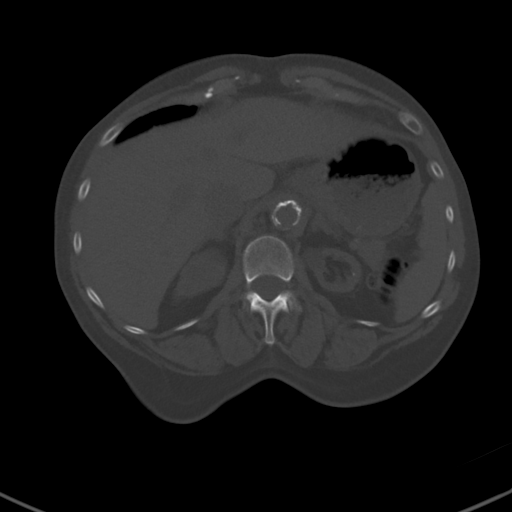
[im 64/84  soft-tissue]
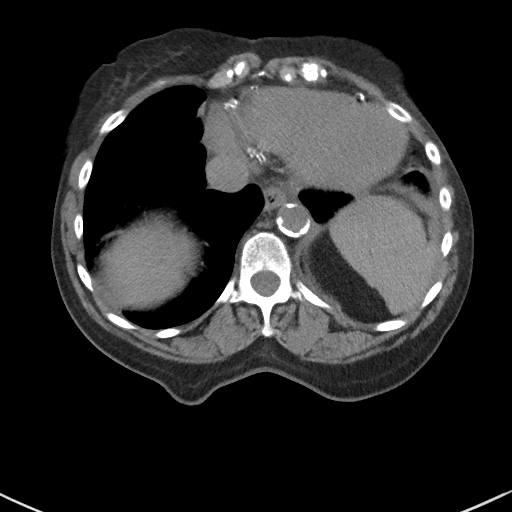
[im 71/84  soft-tissue]
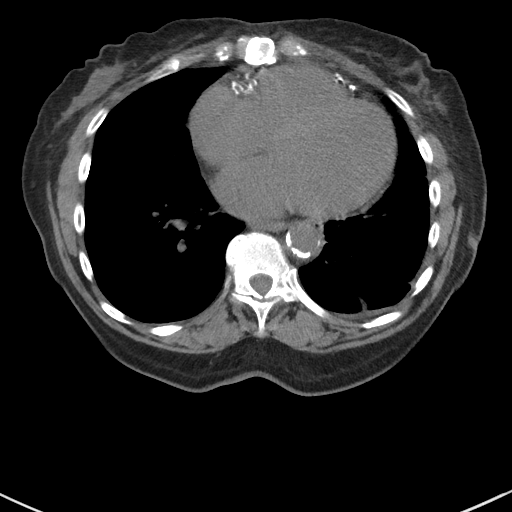
[im 77/84  soft-tissue]
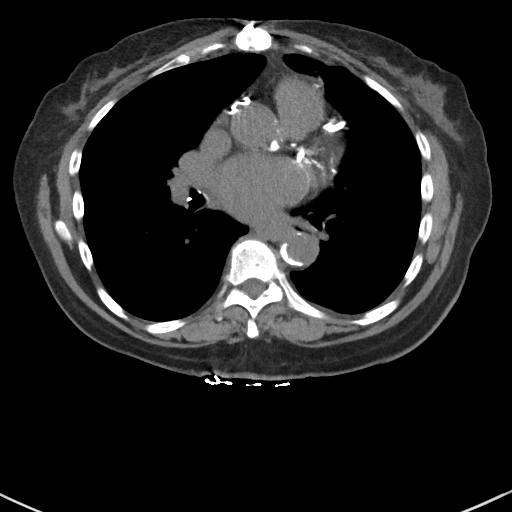

[15 of 46 positions shown; findings below may reference images not displayed]

FINDINGS: Lower chest: Lung bases are clear.

Hepatobiliary: No focal hepatic lesion on noncontrast exam.
Gallbladder collapsed

Pancreas: Pancreas is normal. No ductal dilatation. No pancreatic
inflammation.

Spleen: Normal spleen

Adrenals/urinary tract: Adrenal glands normal.

Intermediate density mass extends from the upper pole of the
atrophic RIGHT kidney. The superior portion of this mass appears
enlarged when compared to remote scans. For example lesion measures
32 by 25 mm in the most superior aspect lesion compared to 28 by 25
mm on CT [DATE] and 29 by 16 mm on [DATE]. n. Lesion
measured 25 by 19 mm on most recent CT scan [DATE].

However and coronal projections lesion is very similar to comparison
exams measuring 20 x 36 mm compared to 25 x 35 mm on [DATE].

Lesion is intermediate density on noncontrast imaging suggesting a
solid lesion.

The LEFT kidney is likewise atrophic. There is a simple fluid
attenuation lesion medially in LEFT kidney. Simple fluid attenuation
interpolar LEFT kidney. Ureters and bladder grossly normal.

Stomach/Bowel: Stomach, small-bowel normal. There is partial RIGHT
hemicolectomy anatomy. Obstructing lesion. Diverticula of the
sigmoid colon without acute inflammation. Low rectal anastomosis.

Vascular/Lymphatic: Heavy calcification abdominal aorta.

Reproductive: Post hysterectomy

Other: No free fluid.

Musculoskeletal: No aggressive osseous lesion.
IMPRESSION: 1. Irregular lobular mass exophytic from the upper pole of the RIGHT
kidney demonstrates potential mild enlargement over more remote
scans. Finding remains concerning for a renal neoplasm.
2. Atrophic kidneys.
3. Simple fluid attenuation cysts of the LEFT kidney.
4.  Aortic Atherosclerosis ([CB]-[CB]).

## 2017-12-28 NOTE — Telephone Encounter (Signed)
Patient called stating that she continues to have swelling in her feet . Please call [phone] and leave a message

## 2017-12-28 NOTE — Telephone Encounter (Signed)
Left message to return call 

## 2017-12-29 DIAGNOSIS — Z992 Dependence on renal dialysis: Secondary | ICD-10-CM | POA: Diagnosis not present

## 2017-12-29 DIAGNOSIS — N186 End stage renal disease: Secondary | ICD-10-CM | POA: Diagnosis not present

## 2018-01-01 DIAGNOSIS — Z6823 Body mass index (BMI) 23.0-23.9, adult: Secondary | ICD-10-CM | POA: Diagnosis not present

## 2018-01-01 DIAGNOSIS — R0982 Postnasal drip: Secondary | ICD-10-CM | POA: Diagnosis not present

## 2018-01-01 DIAGNOSIS — N185 Chronic kidney disease, stage 5: Secondary | ICD-10-CM | POA: Diagnosis not present

## 2018-01-01 DIAGNOSIS — Z992 Dependence on renal dialysis: Secondary | ICD-10-CM | POA: Diagnosis not present

## 2018-01-01 DIAGNOSIS — N186 End stage renal disease: Secondary | ICD-10-CM | POA: Diagnosis not present

## 2018-01-03 DIAGNOSIS — N186 End stage renal disease: Secondary | ICD-10-CM | POA: Diagnosis not present

## 2018-01-03 DIAGNOSIS — Z992 Dependence on renal dialysis: Secondary | ICD-10-CM | POA: Diagnosis not present

## 2018-01-03 NOTE — Telephone Encounter (Signed)
Attempted to reach patient again - [email or web address].

## 2018-01-05 DIAGNOSIS — Z992 Dependence on renal dialysis: Secondary | ICD-10-CM | POA: Diagnosis not present

## 2018-01-05 DIAGNOSIS — N186 End stage renal disease: Secondary | ICD-10-CM | POA: Diagnosis not present

## 2018-01-08 DIAGNOSIS — Z992 Dependence on renal dialysis: Secondary | ICD-10-CM | POA: Diagnosis not present

## 2018-01-08 DIAGNOSIS — N186 End stage renal disease: Secondary | ICD-10-CM | POA: Diagnosis not present

## 2018-01-08 NOTE — Telephone Encounter (Signed)
No return call to present.

## 2018-01-10 DIAGNOSIS — Z992 Dependence on renal dialysis: Secondary | ICD-10-CM | POA: Diagnosis not present

## 2018-01-10 DIAGNOSIS — N186 End stage renal disease: Secondary | ICD-10-CM | POA: Diagnosis not present

## 2018-01-12 DIAGNOSIS — Z992 Dependence on renal dialysis: Secondary | ICD-10-CM | POA: Diagnosis not present

## 2018-01-12 DIAGNOSIS — N186 End stage renal disease: Secondary | ICD-10-CM | POA: Diagnosis not present

## 2018-01-15 DIAGNOSIS — N186 End stage renal disease: Secondary | ICD-10-CM | POA: Diagnosis not present

## 2018-01-15 DIAGNOSIS — Z992 Dependence on renal dialysis: Secondary | ICD-10-CM | POA: Diagnosis not present

## 2018-01-17 DIAGNOSIS — Z992 Dependence on renal dialysis: Secondary | ICD-10-CM | POA: Diagnosis not present

## 2018-01-17 DIAGNOSIS — N186 End stage renal disease: Secondary | ICD-10-CM | POA: Diagnosis not present

## 2018-01-19 DIAGNOSIS — Z992 Dependence on renal dialysis: Secondary | ICD-10-CM | POA: Diagnosis not present

## 2018-01-19 DIAGNOSIS — N186 End stage renal disease: Secondary | ICD-10-CM | POA: Diagnosis not present

## 2018-01-20 DIAGNOSIS — Z992 Dependence on renal dialysis: Secondary | ICD-10-CM | POA: Diagnosis not present

## 2018-01-20 DIAGNOSIS — R1310 Dysphagia, unspecified: Secondary | ICD-10-CM | POA: Diagnosis not present

## 2018-01-20 DIAGNOSIS — M546 Pain in thoracic spine: Secondary | ICD-10-CM | POA: Diagnosis not present

## 2018-01-20 DIAGNOSIS — N186 End stage renal disease: Secondary | ICD-10-CM | POA: Diagnosis not present

## 2018-01-20 DIAGNOSIS — Z6824 Body mass index (BMI) 24.0-24.9, adult: Secondary | ICD-10-CM | POA: Diagnosis not present

## 2018-01-22 DIAGNOSIS — Z992 Dependence on renal dialysis: Secondary | ICD-10-CM | POA: Diagnosis not present

## 2018-01-22 DIAGNOSIS — M546 Pain in thoracic spine: Secondary | ICD-10-CM | POA: Diagnosis not present

## 2018-01-22 DIAGNOSIS — N186 End stage renal disease: Secondary | ICD-10-CM | POA: Diagnosis not present

## 2018-01-23 ENCOUNTER — Other Ambulatory Visit: Payer: Self-pay

## 2018-01-23 NOTE — Patient Outreach (Signed)
[name]) Care Management  01/23/2018  Shawna [name] 05-Jun-1941 [id number]   TELEPHONE SCREENING Referral date: 01/23/18 Referral source: ED utilization Insurance: [employer] Attempt #1   Telephone call to patient regarding ED utilization referral. Unable to reach patient. HIPAA compliant voice message left with call back phone number.   PLAN: RNCM will attempt 2nd telephone call to patient within 4 business days. RNCM will send outreach letter.   Quinn Plowman RN,BSN,CCM [facility] Telephonic  [phone]           ]

## 2018-01-24 ENCOUNTER — Telehealth: Payer: Self-pay | Admitting: Cardiovascular Disease

## 2018-01-24 DIAGNOSIS — N186 End stage renal disease: Secondary | ICD-10-CM | POA: Diagnosis not present

## 2018-01-24 DIAGNOSIS — Z992 Dependence on renal dialysis: Secondary | ICD-10-CM | POA: Diagnosis not present

## 2018-01-24 NOTE — Telephone Encounter (Signed)
Patient concerned with low heart rates and feet still being swollen

## 2018-01-24 NOTE — Telephone Encounter (Signed)
Patient c/o HR 47 today during dialysis and patient held carvedilol this morning. Last week HR was 47-48 during dialysis. Patient denies, dizziness, chest pain or sob. Patient c/o her feet continuing to swell. Patient advised to contact her kidney doctor regarding the swelling in her feet. Patient advised to monitor her HR at home and let our office know if her HR is too low. Verbalized understanding of plan.

## 2018-01-26 DIAGNOSIS — N186 End stage renal disease: Secondary | ICD-10-CM | POA: Diagnosis not present

## 2018-01-26 DIAGNOSIS — Z992 Dependence on renal dialysis: Secondary | ICD-10-CM | POA: Diagnosis not present

## 2018-01-29 ENCOUNTER — Ambulatory Visit (INDEPENDENT_AMBULATORY_CARE_PROVIDER_SITE_OTHER): Payer: Medicare HMO | Admitting: Vascular Surgery

## 2018-01-29 ENCOUNTER — Other Ambulatory Visit: Payer: Self-pay

## 2018-01-29 ENCOUNTER — Encounter (INDEPENDENT_AMBULATORY_CARE_PROVIDER_SITE_OTHER): Payer: Medicare HMO

## 2018-01-29 DIAGNOSIS — N186 End stage renal disease: Secondary | ICD-10-CM | POA: Diagnosis not present

## 2018-01-29 DIAGNOSIS — Z992 Dependence on renal dialysis: Secondary | ICD-10-CM | POA: Diagnosis not present

## 2018-01-29 NOTE — Patient Outreach (Signed)
[facility]) Care Management  01/29/2018  Shawna [name] 1940/11/17 [id number]   TELEPHONE SCREENING Referral date: 01/23/18 Referral source: ED utilization Insurance: [employer] Attempt #2  Telephone call to patient regarding ED utilization referral. Unable to reach patient. HIPAA compliant voice message left with call back phone number.   PLAN: RNCM will attempt 3rd telephone call to patient within 4 business days.   Quinn Plowman RN,BSN,CCM [facility] Telephonic  [phone]

## 2018-01-31 ENCOUNTER — Other Ambulatory Visit: Payer: Self-pay

## 2018-01-31 ENCOUNTER — Emergency Department (HOSPITAL_COMMUNITY)
Admission: EM | Admit: 2018-01-31 | Discharge: 2018-01-31 | Disposition: A | Discharge status: Home / Self Care | Payer: Medicare HMO | Attending: Emergency Medicine | Admitting: Emergency Medicine

## 2018-01-31 ENCOUNTER — Encounter (HOSPITAL_COMMUNITY): Payer: Self-pay | Admitting: Emergency Medicine

## 2018-01-31 ENCOUNTER — Emergency Department (HOSPITAL_COMMUNITY): Payer: Medicare HMO

## 2018-01-31 DIAGNOSIS — S0990XA Unspecified injury of head, initial encounter: Secondary | ICD-10-CM | POA: Diagnosis not present

## 2018-01-31 DIAGNOSIS — N186 End stage renal disease: Secondary | ICD-10-CM | POA: Insufficient documentation

## 2018-01-31 DIAGNOSIS — Z992 Dependence on renal dialysis: Secondary | ICD-10-CM | POA: Insufficient documentation

## 2018-01-31 DIAGNOSIS — I2510 Atherosclerotic heart disease of native coronary artery without angina pectoris: Secondary | ICD-10-CM | POA: Diagnosis not present

## 2018-01-31 DIAGNOSIS — Z7982 Long term (current) use of aspirin: Secondary | ICD-10-CM | POA: Insufficient documentation

## 2018-01-31 DIAGNOSIS — Z23 Encounter for immunization: Secondary | ICD-10-CM | POA: Diagnosis not present

## 2018-01-31 DIAGNOSIS — Y999 Unspecified external cause status: Secondary | ICD-10-CM | POA: Diagnosis not present

## 2018-01-31 DIAGNOSIS — W101XXA Fall (on)(from) sidewalk curb, initial encounter: Secondary | ICD-10-CM | POA: Diagnosis not present

## 2018-01-31 DIAGNOSIS — S01111A Laceration without foreign body of right eyelid and periocular area, initial encounter: Secondary | ICD-10-CM | POA: Diagnosis not present

## 2018-01-31 DIAGNOSIS — W19XXXA Unspecified fall, initial encounter: Secondary | ICD-10-CM

## 2018-01-31 DIAGNOSIS — I132 Hypertensive heart and chronic kidney disease with heart failure and with stage 5 chronic kidney disease, or end stage renal disease: Secondary | ICD-10-CM | POA: Insufficient documentation

## 2018-01-31 DIAGNOSIS — S01112A Laceration without foreign body of left eyelid and periocular area, initial encounter: Secondary | ICD-10-CM | POA: Insufficient documentation

## 2018-01-31 DIAGNOSIS — Y9389 Activity, other specified: Secondary | ICD-10-CM | POA: Insufficient documentation

## 2018-01-31 DIAGNOSIS — S0181XA Laceration without foreign body of other part of head, initial encounter: Secondary | ICD-10-CM

## 2018-01-31 DIAGNOSIS — Y92538 Other ambulatory health services establishments as the place of occurrence of the external cause: Secondary | ICD-10-CM | POA: Diagnosis not present

## 2018-01-31 DIAGNOSIS — Z79899 Other long term (current) drug therapy: Secondary | ICD-10-CM | POA: Insufficient documentation

## 2018-01-31 DIAGNOSIS — I5032 Chronic diastolic (congestive) heart failure: Secondary | ICD-10-CM | POA: Insufficient documentation

## 2018-01-31 IMAGING — CT CT HEAD W/O CM
3 series · 16 of 45 positions shown, 19 images · non-contrast
Comparison: [DATE]

CLINICAL DATA: 76-year-old female with a history of fall and head
injury

EXAM:
CT HEAD WITHOUT CONTRAST
TECHNIQUE: Contiguous axial images were obtained from the base of the skull
through the vertex without intravenous contrast.

[Series 2: head trauma wo · axial · 0.39mm/px · z∈[+1492,+1607]mm · 10 of 28 slices shown, 13 images]
[im 3/28  brain]
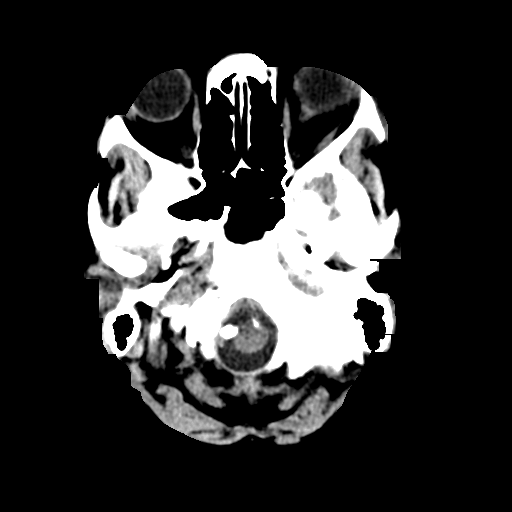
[im 3/28  bone]
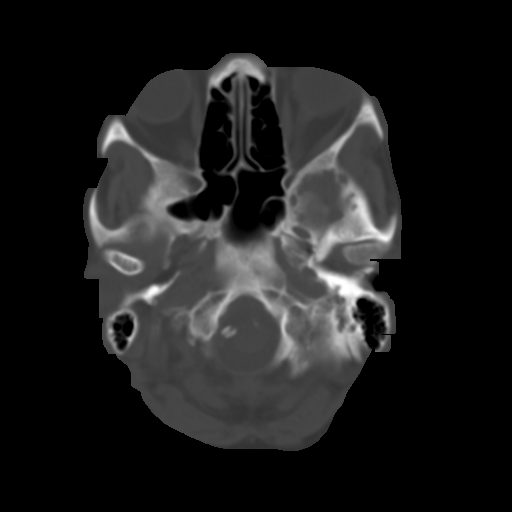
[im 5/28  brain]
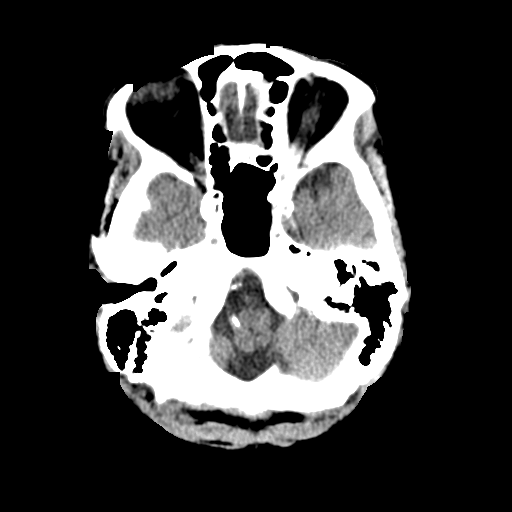
[im 8/28  brain]
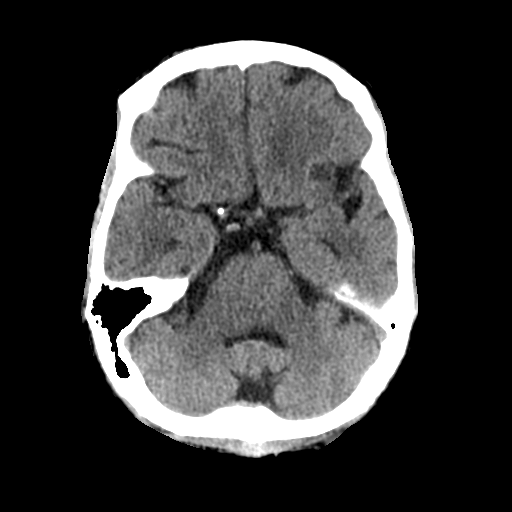
[im 11/28  brain]
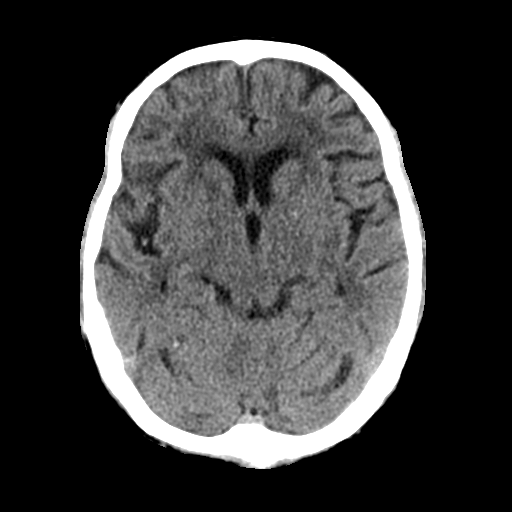
[im 13/28  brain]
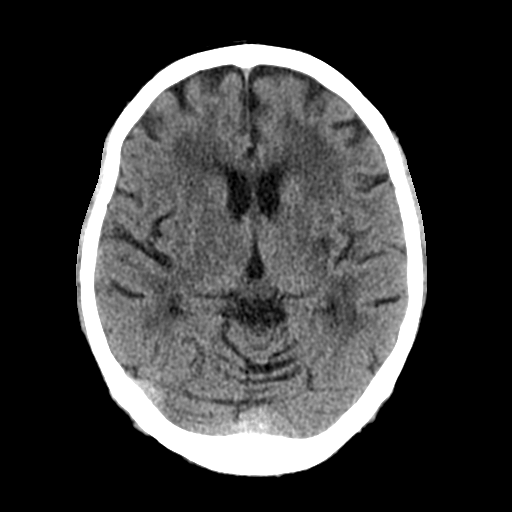
[im 13/28  bone]
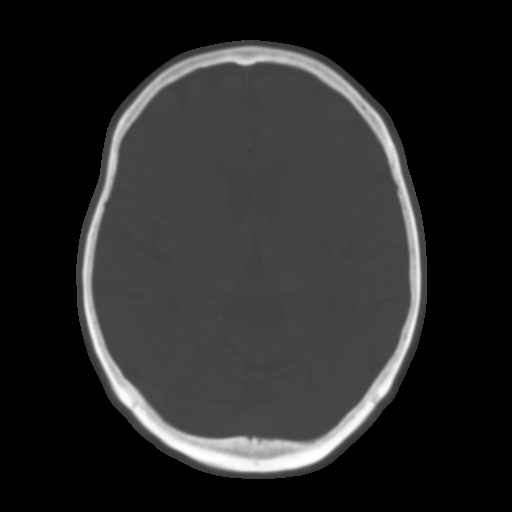
[im 16/28  brain]
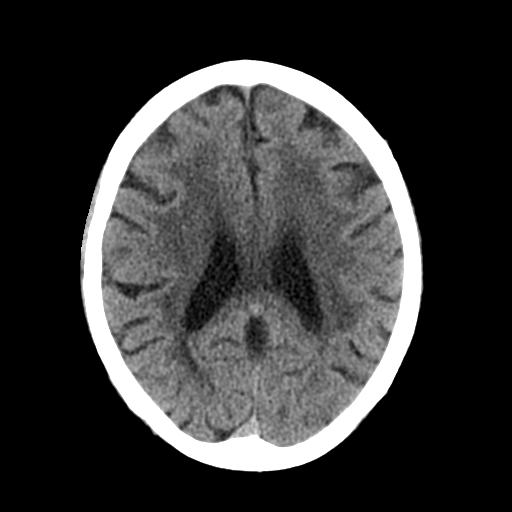
[im 18/28  brain]
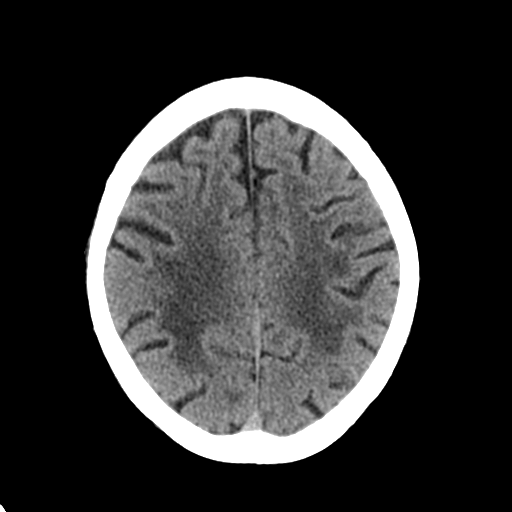
[im 21/28  brain]
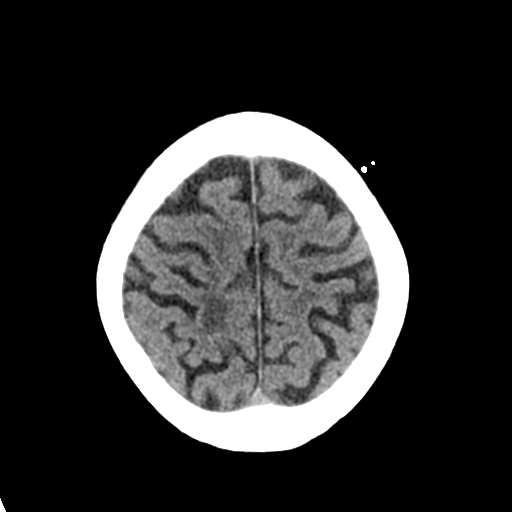
[im 24/28  brain]
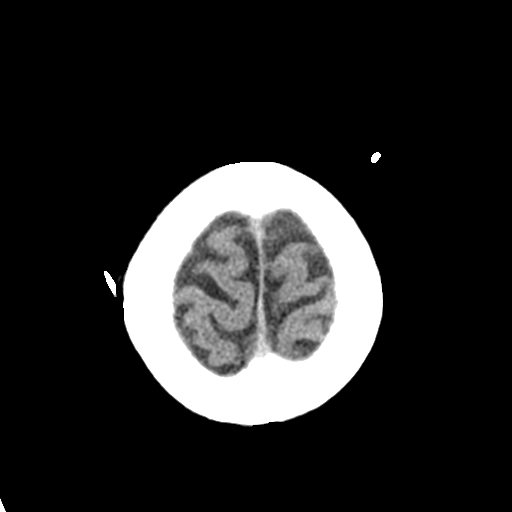
[im 24/28  bone]
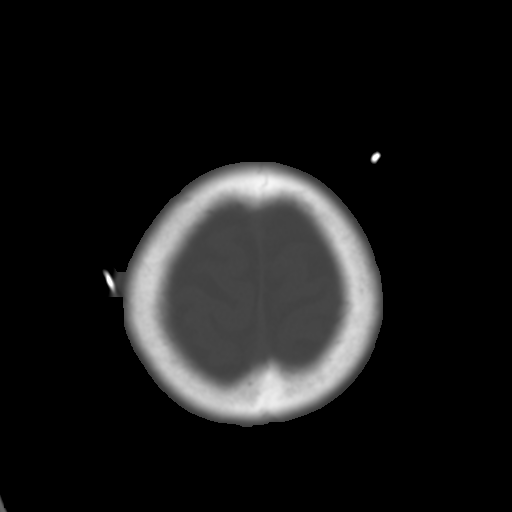
[im 26/28  brain]
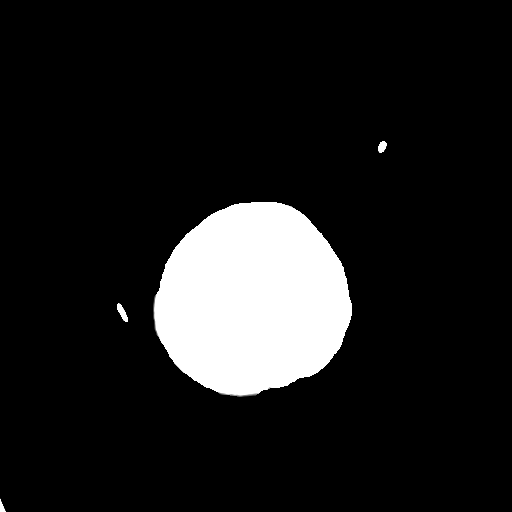

[Series 4: coronal soft tissue · coronal · 0.33mm/px · 3 of 61 slices shown]
[im 21/61  brain]
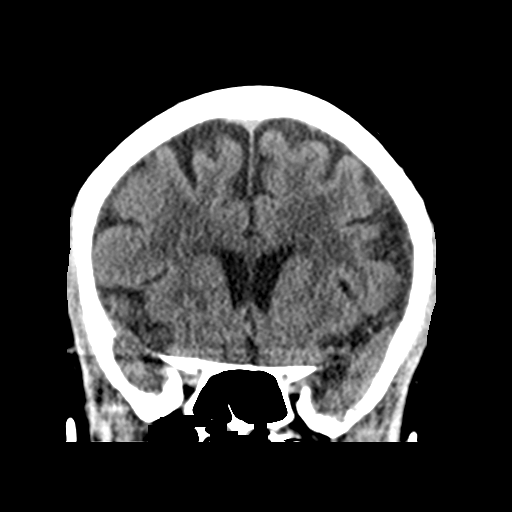
[im 27/61  brain]
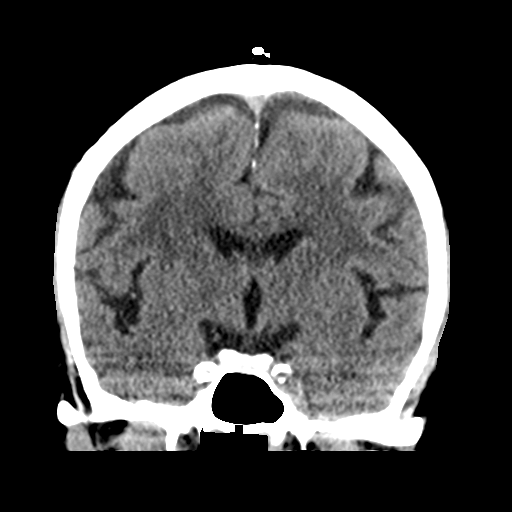
[im 34/61  brain]
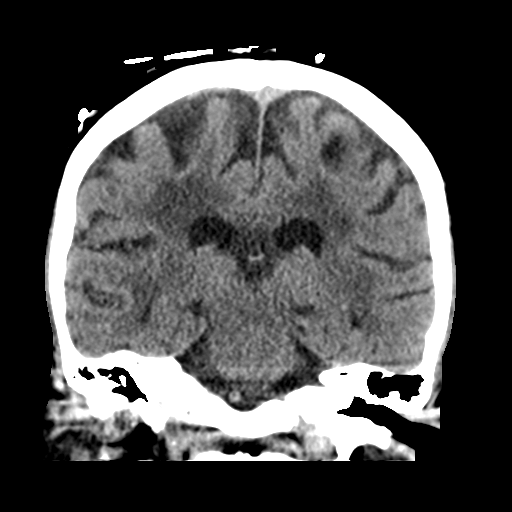

[Series 5: sagittal soft tissue · sagittal · 0.31mm/px · 3 of 50 slices shown]
[im 17/50  brain]
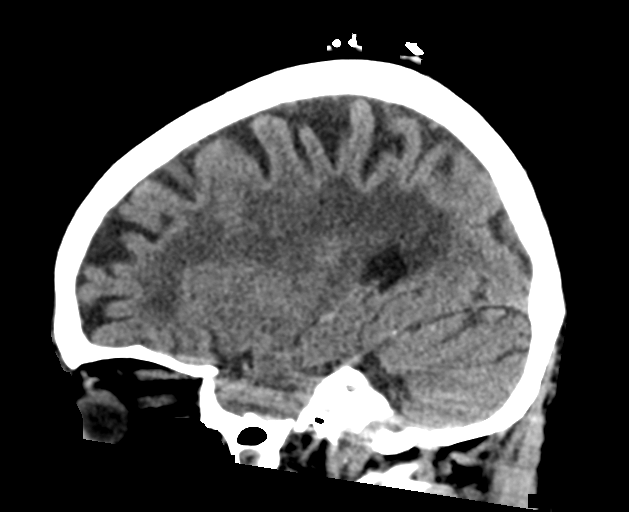
[im 25/50  brain]
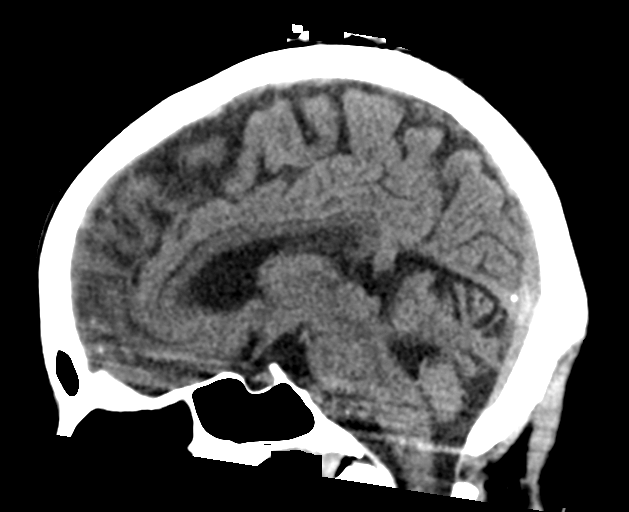
[im 33/50  brain]
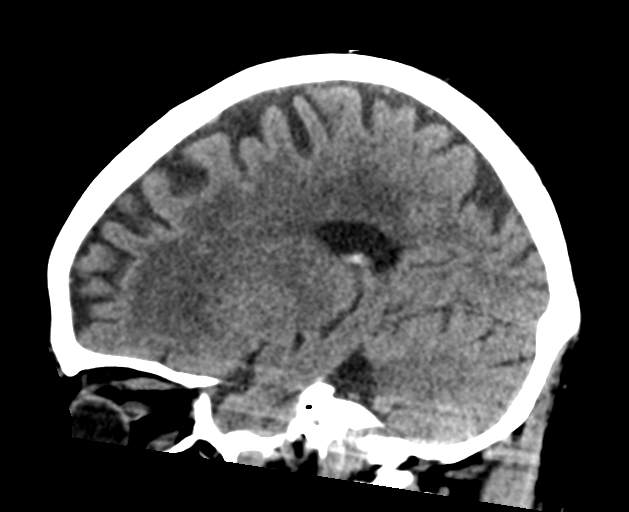

[16 of 45 positions shown; findings below may reference images not displayed]

FINDINGS: Brain: No acute intracranial hemorrhage. No midline shift or mass
effect. Gray-white differentiation maintained. Unremarkable
appearance of the ventricular system. Patchy hypodensity in the
bilateral periventricular white matter.

Vascular: Calcifications of the intracranial vasculature

Skull: No acute fracture.  No aggressive bone lesion identified.

Sinuses/Orbits: Unremarkable appearance of the orbits. Mastoid air
cells clear. No middle ear effusion. No significant sinus disease.

Other: None
IMPRESSION: Negative head CT for acute intracranial abnormality.

Evidence of chronic microvascular ischemic disease and associated
intracranial atherosclerosis.

## 2018-01-31 MED ORDER — TETANUS-DIPHTH-ACELL PERTUSSIS 5-2.5-18.5 LF-MCG/0.5 IM SUSP
0.5000 mL | Freq: Once | INTRAMUSCULAR | Status: AC
  Administered 2018-01-31: 0.5 mL via INTRAMUSCULAR
  Filled 2018-01-31: qty 0.5

## 2018-01-31 NOTE — ED Provider Notes (Signed)
[facility] EMERGENCY DEPARTMENT Provider Note   CSN: [id number] Arrival date & time: 01/31/18  1614     History   Chief Complaint Chief Complaint  Patient presents with  . Fall    HPI Shawna [name] is a 77 y.o. female.  HPI   Patient fell as she was leaving dialysis today as she stepped off a curb while walking with her cane.  She was able to get up and walk afterwards went inside and was recommended to come to the ED for evaluation.  Denies loss of consciousness, blurred vision, palpitations, chest pain, weakness or dizziness.  She cannot recall her last tetanus booster.  She has not had any recent illnesses.  There are no other known modifying factors.  Past Medical History:  Diagnosis Date  . Acute on chronic respiratory failure with hypoxia ([place]) 10/10/2016  . Anxiety   . Arthritis   . AVM (arteriovenous malformation) of colon   . CAD (coronary artery disease)    a. 12/2011 NSTEMI/Cath/PCI LCX (2.25x14 Resolute DES) & D1 (2.25x22 Resolute DES);  b. 01/2012 Cath/PCI: LM 30, LAD 30p, 40-51m, D1 stent ok, 99 in sm branch of diag, LCX patent stent, OM1 20, RCA 95 ost (4.0x12 Promus DES), EF 55%;  c. 04/2012 Lexi Cardiolite  EF 48%, small area of scar @ base/mid inflat wall with mild peri-infarct ischemia.; CABG 12/4  . Carotid artery disease ([place])    a. 93-81% LICA, 02/2992   . Chronic bronchitis ([place])   . Chronic diastolic CHF (congestive heart failure) ([place])    a. 02/2012 Echo EF 60-65%, nl wall motion, Gr 1 DD, mod MR  . Colon cancer ([place]) 1992  . Esophageal stricture   . ESRD on hemodialysis ([place])    ESRD due to HTN, started dialysis 2011 and gets HD at [facility] with Dr Hinda Lenis on MWF schedule.  Access is LUA AVF as of Sept 2014.   Marland Kitchen GERD (gastroesophageal reflux disease)   . High cholesterol 12/2011  . History of blood transfusion 07/2011; 12/2011; 01/2012 X 2; 04/2012  . History of gout   . History of lower GI bleeding   . Hypertension   . Iron deficiency anemia   .  Mitral regurgitation    a. Moderate by echo, 02/2012  . Myocardial infarction ([place])   . Ovarian cancer ([place]) 1992  . Pneumonia ~ 2009  . PUD (peptic ulcer disease)   . TIA (transient ischemic attack)     Patient Active Problem List   Diagnosis Date Noted  . Hand steal syndrome ([place]) 08/01/2017  . Anemia 07/14/2017  . Coronary artery disease of bypass graft of native heart with stable angina pectoris ([place]) 06/05/2017  . Mesenteric ischemia ([place])   . Diverticulitis   . SBO (small bowel obstruction) ([place]) 03/22/2017  . Enteritis   . Complication of vascular access for dialysis 03/19/2017  . Preoperative clearance 01/25/2017  . Symptomatic anemia 10/24/2016  . H/O non-ST elevation myocardial infarction (NSTEMI) 10/24/2016  . Fluid overload 10/10/2016  . Complication from renal dialysis device 10/10/2016  . Non-ST elevation MI (NSTEMI) ([place])   . Encounter for fitting and adjustment of vascular catheter   . End stage renal disease ([place])   . ESRD (end stage renal disease) ([place])   . Heme positive stool   . Demand ischemia ([place]) 07/27/2016  . Hypertensive emergency 07/08/2016  . Acute on chronic respiratory failure with hypoxia ([place])   . Cardiac arrest ([place])   . Palliative care  encounter   . Goals of care, counseling/discussion   . Hypertensive crisis without congestive heart failure 05/09/2016  . Acute pulmonary edema ([place]) 04/06/2016  . Acute respiratory failure ([place]) 04/06/2016  . Hypertensive crisis 01/27/2016  . History of colon cancer 01/27/2016  . History of ovarian cancer 01/27/2016  . Hypertensive urgency 01/27/2016  . Narrow complex tachycardia ([place]) 09/08/2015  . SVT (supraventricular tachycardia) ([place]) 09/08/2015  . Influenza A 08/30/2015  . Acute on chronic diastolic CHF (congestive heart failure) ([place]) 05/04/2015  . Unstable angina ([place]) 05/03/2015  . Essential hypertension   . Pain in joint, lower leg 08/14/2014  . Chest pain 11/26/2013  . Small bowel  obstruction, partial ([place]) 05/29/2013  . Chronic diastolic CHF (congestive heart failure) ([place]) 03/22/2013  . GI bleeding 03/21/2013  . Acute blood loss anemia 03/21/2013  . Vaginal odor 03/12/2013  . Vaginal discharge 03/12/2013  . Occlusion and stenosis of carotid artery without mention of cerebral infarction 01/24/2013  . Hx of CABG 07/05/2012  . Carotid artery disease ([place]) 07/05/2012  . Anemia of chronic kidney failure 07/03/2012  . Secondary hyperparathyroidism ([place]) 07/03/2012  . Mitral regurgitation 06/12/2012  . Pneumonia 06/09/2012  . Non-STEMI (non-ST elevated myocardial infarction) ([place]) 06/08/2012  . 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 GERD (gastroesophageal reflux disease) 01/09/2012  . HLD (hyperlipidemia) 01/05/2012  . Atherosclerosis of native coronary artery of native heart without angina pectoris 12/16/2011  . ESRD on hemodialysis ([place]) 12/16/2011    Past Surgical History:  Procedure Laterality Date  . ABDOMINAL HYSTERECTOMY  1992  . APPENDECTOMY  06/1990  . AV FISTULA PLACEMENT  07/2009   left upper arm  . AV FISTULA PLACEMENT Right 09/06/2016   Procedure: RIGHT FOREARM ARTERIOVENOUS (AV) GRAFT;  Surgeon: Elam Dutch, MD;  Location: [facility] OR;  Service: Vascular;  Laterality: Right;  . AV FISTULA PLACEMENT N/A 02/24/2017   Procedure: INSERTION OF ARTERIOVENOUS (AV) GORE-TEX GRAFT ARM (BRACHIAL AXILLARY);  Surgeon: Katha Cabal, MD;  Location: [facility] ORS;  Service: Vascular;  Laterality: N/A;  . [place] Right 09/06/2016   Procedure: REMOVAL OF Right Arm ARTERIOVENOUS GORETEX GRAFT and Vein Patch angioplasty of brachial artery;  Surgeon: Angelia Mould, MD;  Location: [place];  Service: Vascular;  Laterality: Right;  . COLON RESECTION  1992  . COLON SURGERY    . CORONARY ANGIOPLASTY WITH STENT PLACEMENT  12/15/11   "2"  . CORONARY ANGIOPLASTY WITH STENT PLACEMENT  y/2013   "1; makes total of 3" (05/02/2012)  .  CORONARY ARTERY BYPASS GRAFT  06/13/2012   Procedure: CORONARY ARTERY BYPASS GRAFTING (CABG);  Surgeon: Grace Isaac, MD;  Location: [place];  Service: Open Heart Surgery;  Laterality: N/A;  cabg x four;  using left internal mammary artery, and left leg greater saphenous vein harvested endoscopically  . CORONARY STENT INTERVENTION N/A 10/13/2016   Procedure: Coronary Stent Intervention;  Surgeon: Troy Sine, MD;  Location: [place] CV LAB;  Service: Cardiovascular;  Laterality: N/A;  . DIALYSIS/PERMA CATHETER REMOVAL N/A 04/18/2017   Procedure: DIALYSIS/PERMA CATHETER REMOVAL;  Surgeon: Katha Cabal, MD;  Location: [place] CV LAB;  Service: Cardiovascular;  Laterality: N/A;  . DILATION AND CURETTAGE OF UTERUS    . ESOPHAGOGASTRODUODENOSCOPY  01/20/2012   Procedure: ESOPHAGOGASTRODUODENOSCOPY (EGD);  Surgeon: Ladene Artist, MD,FACG;  Location: [facility] ENDOSCOPY;  Service: Endoscopy;  Laterality: N/A;  . ESOPHAGOGASTRODUODENOSCOPY N/A 03/26/2013   Procedure: ESOPHAGOGASTRODUODENOSCOPY (EGD);  Surgeon: Irene Shipper, MD;  Location: [facility] ENDOSCOPY;  Service: Endoscopy;  Laterality: N/A;  . ESOPHAGOGASTRODUODENOSCOPY N/A 04/30/2015   Procedure: ESOPHAGOGASTRODUODENOSCOPY (EGD);  Surgeon: Rogene Houston, MD;  [facility]: [facility] ENDO SUITE;  Service: Endoscopy;  Laterality: N/A;  1pm - moved to 10/20 @ 1:10  . ESOPHAGOGASTRODUODENOSCOPY N/A 07/29/2016   Procedure: ESOPHAGOGASTRODUODENOSCOPY (EGD);  Surgeon: Manus Gunning, MD;  Location: [name];  Service: Gastroenterology;  Laterality: N/A;  enteroscopy  . INTRAOPERATIVE TRANSESOPHAGEAL ECHOCARDIOGRAM  06/13/2012   Procedure: INTRAOPERATIVE TRANSESOPHAGEAL ECHOCARDIOGRAM;  Surgeon: Grace Isaac, MD;  Location: [place];  Service: Open Heart Surgery;  Laterality: N/A;  . IR GENERIC HISTORICAL  07/26/2016   IR FLUORO GUIDE CV LINE RIGHT 07/26/2016 Greggory Keen, MD [facility]-INTER[facility]  . IR GENERIC HISTORICAL  07/26/2016   IR US GUIDE VASC  ACCESS RIGHT 07/26/2016 Greggory Keen, MD [facility]  . IR GENERIC HISTORICAL  08/02/2016   IR US GUIDE VASC ACCESS RIGHT 08/02/2016 Greggory Keen, MD [facility]  . IR GENERIC HISTORICAL  08/02/2016   IR FLUORO GUIDE CV LINE RIGHT 08/02/2016 Greggory Keen, MD [facility]  . IR RADIOLOGY PERIPHERAL GUIDED IV START  03/28/2017  . IR US GUIDE VASC ACCESS RIGHT  03/28/2017  . LEFT HEART CATH AND CORONARY ANGIOGRAPHY N/A 09/20/2016   Procedure: Left Heart Cath and Coronary Angiography;  Surgeon: [name], MD;  Location: [place] CV LAB;  Service: Cardiovascular;  Laterality: N/A;  . LEFT HEART CATH AND CORS/GRAFTS ANGIOGRAPHY N/A 10/13/2016   Procedure: Left Heart Cath and Cors/Grafts Angiography;  Surgeon: Troy Sine, MD;  Location: [place] CV LAB;  Service: Cardiovascular;  Laterality: N/A;  . LEFT HEART CATHETERIZATION WITH CORONARY ANGIOGRAM N/A 12/15/2011   Procedure: LEFT HEART CATHETERIZATION WITH CORONARY ANGIOGRAM;  Surgeon: Burnell Blanks, MD;  Location: [facility] CATH LAB;  Service: Cardiovascular;  Laterality: N/A;  . LEFT HEART CATHETERIZATION WITH CORONARY ANGIOGRAM N/A 01/10/2012   Procedure: LEFT HEART CATHETERIZATION WITH CORONARY ANGIOGRAM;  Surgeon: Peter M Martinique, MD;  Location: [facility] CATH LAB;  Service: Cardiovascular;  Laterality: N/A;  . LEFT HEART CATHETERIZATION WITH CORONARY ANGIOGRAM N/A 06/08/2012   Procedure: LEFT HEART CATHETERIZATION WITH CORONARY ANGIOGRAM;  Surgeon: Burnell Blanks, MD;  Location: [name] CATH LAB;  Service: Cardiovascular;  Laterality: N/A;  . LEFT HEART CATHETERIZATION WITH CORONARY/GRAFT ANGIOGRAM N/A 12/10/2013   Procedure: LEFT HEART CATHETERIZATION WITH Beatrix Fetters;  Surgeon: [name], MD;  Location: [facility] CATH LAB;  Service: Cardiovascular;  Laterality: N/A;  . OVARY SURGERY     ovarian cancer  . REVISION OF ARTERIOVENOUS GORETEX GRAFT N/A 02/24/2017   Procedure: REVISION OF ARTERIOVENOUS GORETEX GRAFT (RESECTION);   Surgeon: Katha Cabal, MD;  Location: [facility] ORS;  Service: Vascular;  Laterality: N/A;  Earney Mallet N/A 10/15/2013   Procedure: Fistulogram;  Surgeon: Serafina Mitchell, MD;  Location: [facility] CATH LAB;  Service: Cardiovascular;  Laterality: N/A;  . THROMBECTOMY / ARTERIOVENOUS GRAFT REVISION  2011   left upper arm  . TUBAL LIGATION  1980's  . UPPER EXTREMITY ANGIOGRAPHY Bilateral 12/06/2016   Procedure: Upper Extremity Angiography;  Surgeon: Katha Cabal, MD;  Location: [place] CV LAB;  Service: Cardiovascular;  Laterality: Bilateral;  . UPPER EXTREMITY INTERVENTION Left 06/06/2017   Procedure: UPPER EXTREMITY INTERVENTION;  Surgeon: Katha Cabal, MD;  Location: [place] CV LAB;  Service: Cardiovascular;  Laterality: Left;     OB History    Gravida  2   Para  2   Term  Preterm  2   AB      Living  2     SAB      TAB      Ectopic      Multiple      Live Births               Home Medications    Prior to Admission medications   Medication Sig Start Date End Date Taking? Authorizing Provider  ALPRAZolam (XANAX) 0.25 MG tablet Take 1 tablet (0.25 mg total) by mouth 2 (two) times daily as needed for anxiety or sleep. 04/05/17   Arrien, Jimmy Picket, MD  amiodarone (PACERONE) 200 MG tablet Take 1 tablet (200 mg total) by mouth daily. 12/08/16   Herminio Commons, MD  aspirin EC 81 MG tablet Take 81 mg by mouth once. FOR CHEST PAIN    [provider]  bisacodyl (DULCOLAX) 10 MG suppository Place 1 suppository (10 mg total) rectally as needed for moderate constipation. 11/21/17   Rehman, Mechele Dawley, MD  carvedilol (COREG) 6.25 MG tablet TAKE 1 TABLET BY MOUTH TWICE DAILY WITH  A  MEAL Patient taking differently: TAKE 1 TABLET (6.25 MG) BY MOUTH TWICE DAILY WITH  A  MEAL 06/15/17   Herminio Commons, MD  cinacalcet (SENSIPAR) 30 MG tablet Take 30 mg by mouth daily after supper.    [provider]  cloNIDine (CATAPRES) 0.2 MG  tablet Take 0.1-0.2 mg by mouth daily as needed (high blood pressure).     [provider]  diphenhydrAMINE (BENADRYL) 25 mg capsule Take 50 mg by mouth daily as needed for itching or allergies.     [provider]  diphenoxylate-atropine (LOMOTIL) 2.5-0.025 MG tablet Take 1 tablet by mouth 4 (four) times daily as needed for diarrhea or loose stools. 08/13/17   Tanna Furry, MD  epoetin alfa (EPOGEN,PROCRIT) 36144 UNIT/ML injection Inject 1 mL (10,000 Units total) into the vein every Monday, Wednesday, and Friday with hemodialysis. 04/11/16   Orvan Falconer, MD  escitalopram (LEXAPRO) 10 MG tablet  11/16/17   [provider]  ferrous sulfate 325 (65 FE) MG EC tablet Take 1 tablet (325 mg total) by mouth 2 (two) times daily with a meal. Patient taking differently: Take 325 mg by mouth daily.  07/15/17 12/19/17  Johnson, Clanford L, MD  fluticasone (FLONASE) 50 MCG/ACT nasal spray Place 1 spray at bedtime as needed into both nostrils for allergies.     [provider]  guaiFENesin (ROBITUSSIN) 100 MG/5ML SOLN Take 5 mLs (100 mg total) by mouth every 6 (six) hours as needed for cough or to loosen phlegm. 10/16/17   Arrien, Jimmy Picket, MD  hydrALAZINE (APRESOLINE) 50 MG tablet Take 50 mg by mouth 2 (two) times daily as needed (high blood pressure).    [provider]  isosorbide mononitrate (IMDUR) 120 MG 24 hr tablet TAKE 1 TABLET BY MOUTH ONCE DAILY 12/01/17   Herminio Commons, MD  lidocaine-prilocaine (EMLA) cream Apply 1 application topically every Monday, Wednesday, and Friday. Prior to dialysis    [provider]  loratadine (CLARITIN) 10 MG tablet Take 10 mg by mouth daily as needed for allergies.    [provider]  multivitamin (RENA-VIT) TABS tablet Take 1 tablet by mouth daily.    [provider]  nitroGLYCERIN (NITROSTAT) 0.4 MG SL tablet Place 1 tablet (0.4 mg total) under the tongue every 5 (five) minutes as needed for chest  pain. 06/05/17   Kate Sable  A, MD  omeprazole (PRILOSEC) 20 MG capsule Take 1 capsule (20 mg total) by mouth daily. 11/21/17   Rehman, Mechele Dawley, MD  ondansetron (ZOFRAN-ODT) 4 MG disintegrating tablet Take 4 mg by mouth every 8 (eight) hours as needed for nausea or vomiting.  07/12/17   [provider]  sevelamer carbonate (RENVELA) 800 MG tablet Take 800-2,400 mg by mouth See admin instructions. Take 3 tablets (2400) by mouth twice daily with meals, take 1 tablet (800 mg) with snacks    [provider]  simvastatin (ZOCOR) 20 MG tablet TAKE ONE TABLET BY MOUTH AT BEDTIME Patient taking differently: TAKE ONE TABLET (20 MG) BY MOUTH DAILY AT BEDTIME 02/13/17   Herminio Commons, MD  ticagrelor (BRILINTA) 60 MG TABS tablet Take 1 tablet (60 mg total) by mouth 2 (two) times daily. 11/08/17   Herminio Commons, MD  Wheat Dextrin (BENEFIBER DRINK MIX) PACK Take 4 g by mouth at bedtime. 11/21/17   Rogene Houston, MD    Family History Family History  Problem Relation Age of Onset  . Heart disease Mother        Heart Disease before age 8  . Hyperlipidemia Mother   . Hypertension Mother   . Diabetes Mother   . Heart attack Mother   . Heart disease Father        Heart Disease before age 25  . Hyperlipidemia Father   . Hypertension Father   . Diabetes Father   . Diabetes Sister   . Hypertension Sister   . Diabetes Brother   . Hyperlipidemia Brother   . Heart attack Brother   . Hypertension Sister   . Heart attack Brother   . Other Unknown        noncontributory for early CAD  . Colon cancer Neg Hx   . Esophageal cancer Neg Hx   . Liver disease Neg Hx   . Kidney disease Neg Hx   . Colon polyps Neg Hx     Social History Social History   Tobacco Use  . Smoking status: Never Smoker  . Smokeless tobacco: Never Used  Substance Use Topics  . Alcohol use: No    Alcohol/week: 0.0 oz  . Drug use: No     Allergies   Aspirin; Penicillins; Amlodipine; Bactrim  [sulfamethoxazole-trimethoprim]; Contrast media [iodinated diagnostic agents]; Iron; Nitrofurantoin; Tylenol [acetaminophen]; Gabapentin; Dexilant [dexlansoprazole]; Levaquin [levofloxacin in d5w]; Morphine and related; Plavix [clopidogrel bisulfate]; Protonix [pantoprazole sodium]; and Venofer [ferric oxide]   Review of Systems Review of Systems  All other systems reviewed and are negative.    Physical Exam Updated Vital Signs BP (!) 195/52   Pulse (!) 48   Temp 98.2 F (36.8 C) (Oral)   Resp 18   Ht 5\' 1"  (1.549 m)   Wt 54.4 kg (120 lb)   SpO2 95%   BMI 22.67 kg/m   Physical Exam  Constitutional: She is oriented to person, place, and time. She appears well-developed. No distress.  Elderly, frail  HENT:  Head: Normocephalic.  Right Ear: External ear normal.  Left Ear: External ear normal.  Mild contusion left lateral eyebrow region with superficial laceration, gaping somewhat and bleeding.  Eyes: Pupils are equal, round, and reactive to light. Conjunctivae and EOM are normal.  Neck: Normal range of motion and phonation normal. Neck supple.  Cardiovascular: Normal rate, regular rhythm and normal heart sounds.  Vascular fistula left upper arm with normal thrill.  Palpation and there is no bleeding underneath the  bandage.  Pulmonary/Chest: Effort normal and breath sounds normal. She exhibits no bony tenderness.  Abdominal: Soft. There is no tenderness.  Musculoskeletal: Normal range of motion.  Normal strength arms and legs bilaterally.  Neurological: She is alert and oriented to person, place, and time. No cranial nerve deficit or sensory deficit. She exhibits normal muscle tone. Coordination normal.  No dysarthria, aphasia or nystagmus.  Skin: Skin is warm, dry and intact.  Psychiatric: She has a normal mood and affect. Her behavior is normal. Judgment and thought content normal.  Nursing note and vitals reviewed.    ED Treatments / Results  Labs (all labs ordered are  listed, but only abnormal results are displayed) Labs Reviewed - No data to display  EKG None  Radiology Ct Head Wo Contrast  Result Date: 01/31/2018 CLINICAL DATA:  77 year old female with a history of fall and head injury EXAM: CT HEAD WITHOUT CONTRAST TECHNIQUE: Contiguous axial images were obtained from the base of the skull through the vertex without intravenous contrast. COMPARISON:  07/27/2016 FINDINGS: Brain: No acute intracranial hemorrhage. No midline shift or mass effect. Gray-white differentiation maintained. Unremarkable appearance of the ventricular system. Patchy hypodensity in the bilateral periventricular white matter. Vascular: Calcifications of the intracranial vasculature Skull: No acute fracture.  No aggressive bone lesion identified. Sinuses/Orbits: Unremarkable appearance of the orbits. Mastoid air cells clear. No middle ear effusion. No significant sinus disease. Other: None IMPRESSION: Negative head CT for acute intracranial abnormality. Evidence of chronic microvascular ischemic disease and associated intracranial atherosclerosis. Electronically Signed   By: Corrie Mckusick D.O.   On: 01/31/2018 18:55    Procedures .Marland KitchenLaceration Repair Date/Time: 01/31/2018 7:15 PM Performed by: Daleen Bo, MD Authorized by: Daleen Bo, MD   Consent:    Consent obtained:  Verbal   Consent given by:  Patient   Risks discussed:  Pain   Alternatives discussed:  No treatment Anesthesia (see MAR for exact dosages):    Anesthesia method:  None Laceration details:    Location:  Face   Face location:  R eyebrow   Length (cm):  2.5   Depth (mm):  10 Repair type:    Repair type:  Simple Pre-procedure details:    Preparation:  Patient was prepped and draped in usual sterile fashion Exploration:    Hemostasis achieved with:  Direct pressure   Wound extent: no fascia violation noted, no foreign bodies/material noted, no muscle damage noted, no underlying fracture noted and no  vascular damage noted     Contaminated: no   Treatment:    Area cleansed with:  Saline   Amount of cleaning:  Standard   Visualized foreign bodies/material removed: no   Skin repair:    Repair method:  Tissue adhesive Approximation:    Approximation:  Close Post-procedure details:    Dressing:  Open (no dressing)   Patient tolerance of procedure:  Tolerated well, no immediate complications   (including critical care time)  Medications Ordered in ED Medications  Tdap (BOOSTRIX) injection 0.5 mL (0.5 mLs Intramuscular Given 01/31/18 1823)     Initial Impression / Assessment and Plan / ED Course  I have reviewed the triage vital signs and the nursing notes.  Pertinent labs & imaging results that were available during my care of the patient were reviewed by me and considered in my medical decision making (see chart for details).     .  Patient Vitals for the past 24 hrs:  BP Temp Temp src Pulse Resp SpO2 Height Weight  01/31/18 1900 (!) 195/52 - - - - - - -  01/31/18 1845 - - - (!) 48 - 95 % - -  01/31/18 1740 (!) 162/58 - - (!) 48 18 96 % - -  01/31/18 1623 - - - - - - 5\' 1"  (1.549 m) 54.4 kg (120 lb)  01/31/18 1622 (!) 175/43 98.2 F (36.8 C) Oral (!) 50 18 97 % - -    7:18 PM Reevaluation with update and discussion. After initial assessment and treatment, an updated evaluation reveals she is comfortable has no further complaints.  Findings discussed with patient and husband, all questions answered. Daleen Bo   Medical Decision Making: Fall likely mechanical with mild head injury and facial laceration.  Doubt intracranial injury, spinal injury, facial fracture.  CRITICAL CARE-no Performed by: Daleen Bo   Nursing Notes Reviewed/ Care Coordinated Applicable Imaging Reviewed Interpretation of Laboratory Data incorporated into ED treatment  The patient appears reasonably screened and/or stabilized for discharge and I doubt any other medical condition or other [facility]  requiring further screening, evaluation, or treatment in the ED at this time prior to discharge.  Plan: Home Medications-continue usual medication use Motrin for pain; Home Treatments-rest, wound care; return here if the recommended treatment, does not improve the symptoms; Recommended follow up-PCP, as needed     Final Clinical Impressions(s) / ED Diagnoses   Final diagnoses:  Fall, initial encounter  Injury of head, initial encounter  Facial laceration, initial encounter    ED Discharge Orders    None       Daleen Bo, MD 01/31/18 1919

## 2018-01-31 NOTE — ED Triage Notes (Signed)
Pt states was walking out of dialysis and tripped over curb falling to cement. Pt states hit corner of eyebrow on cement. Denies neck pain or headache. Pt also reports abrasion to left knee.

## 2018-01-31 NOTE — Discharge Instructions (Addendum)
Take Motrin as needed for pain.  See your doctor or return here if needed for problems.

## 2018-02-01 ENCOUNTER — Other Ambulatory Visit: Payer: Self-pay

## 2018-02-01 NOTE — Patient Outreach (Signed)
[facility] [facility]) Care Management  02/01/2018  Shawna [name] 01-07-1941 [id number]  TELEPHONE SCREENING Referral date:01/23/18 Referral source:ED utilization Insurance:[employer] Attempt #3  Telephone call to patient regarding ED utilization referral. Unable to reach patient. HIPAA compliant voice message left with call back phone number.   PLAN: If no return call RNCM will proceed with case closure.   Quinn Plowman RN,BSN,CCM [facility] Telephonic  [phone]

## 2018-02-02 DIAGNOSIS — Z992 Dependence on renal dialysis: Secondary | ICD-10-CM | POA: Diagnosis not present

## 2018-02-02 DIAGNOSIS — N186 End stage renal disease: Secondary | ICD-10-CM | POA: Diagnosis not present

## 2018-02-05 DIAGNOSIS — Z992 Dependence on renal dialysis: Secondary | ICD-10-CM | POA: Diagnosis not present

## 2018-02-05 DIAGNOSIS — N186 End stage renal disease: Secondary | ICD-10-CM | POA: Diagnosis not present

## 2018-02-06 ENCOUNTER — Other Ambulatory Visit: Payer: Self-pay | Admitting: Cardiovascular Disease

## 2018-02-06 ENCOUNTER — Ambulatory Visit (INDEPENDENT_AMBULATORY_CARE_PROVIDER_SITE_OTHER): Payer: Medicare HMO

## 2018-02-06 ENCOUNTER — Encounter (INDEPENDENT_AMBULATORY_CARE_PROVIDER_SITE_OTHER): Payer: Self-pay | Admitting: Vascular Surgery

## 2018-02-06 ENCOUNTER — Ambulatory Visit (INDEPENDENT_AMBULATORY_CARE_PROVIDER_SITE_OTHER): Payer: Medicare HMO | Admitting: Vascular Surgery

## 2018-02-06 VITALS — BP 193/64 | HR 56 | Resp 13 | Ht 61.0 in | Wt 121.0 lb

## 2018-02-06 DIAGNOSIS — N186 End stage renal disease: Secondary | ICD-10-CM

## 2018-02-06 DIAGNOSIS — Z992 Dependence on renal dialysis: Secondary | ICD-10-CM

## 2018-02-06 DIAGNOSIS — D649 Anemia, unspecified: Secondary | ICD-10-CM | POA: Diagnosis not present

## 2018-02-06 NOTE — Progress Notes (Signed)
Subjective:    Patient ID: Shawna [name], female    DOB: 1941-05-05, 77 y.o.   MRN: [id number] Chief Complaint  Patient presents with  . Follow-up    6 month HDA f/u   Patient presents to review vascular studies.  The patient was last seen on November 02, 2017 and evaluation of left upper extremity shoulder pain.  The patient's symptoms seem to have somewhat improved and only happen on occasion. The patient underwent a duplex ultrasound of the AV access which was notable for a patent graft without any significant hemodynamic stenosis. Hemodialysis doppler flow is 859. The patient denies any issues with hemodialysis such as cannulation problems, increased bleeding, decrease in doppler flow or recirculation. The patient also denies any graft skin breakdown, pain, edema, pallor or ulceration of the arm / hand.  Patient denies any uremic symptoms.  Patient denies any fever, nausea vomiting.  Review of Systems  Constitutional: Negative.   HENT: Negative.   Eyes: Negative.   Respiratory: Negative.   Cardiovascular: Negative.   Gastrointestinal: Negative.   Endocrine: Negative.   Genitourinary:       ESRD  Musculoskeletal: Negative.   Skin: Negative.   Allergic/Immunologic: Negative.   Neurological: Negative.   Hematological: Negative.   Psychiatric/Behavioral: Negative.       Objective:   Physical Exam  Constitutional: She is oriented to person, place, and time. She appears well-developed and well-nourished. No distress.  HENT:  Head: Normocephalic and atraumatic.  Right Ear: External ear normal.  Left Ear: External ear normal.  Eyes: Pupils are equal, round, and reactive to light. Conjunctivae and EOM are normal.  Neck: Normal range of motion.  Cardiovascular: Normal rate, regular rhythm, normal heart sounds and intact distal pulses.  Pulses:      Radial pulses are 2+ on the right side, and 2+ on the left side.  Left upper extremity dialysis access: Good bruit and thrill.  Skin is  intact.  Pulmonary/Chest: Effort normal and breath sounds normal.  Musculoskeletal: Normal range of motion. She exhibits no edema.  Neurological: She is alert and oriented to person, place, and time.  Skin: Skin is warm and dry. She is not diaphoretic.  Psychiatric: She has a normal mood and affect. Her behavior is normal. Judgment and thought content normal.  Vitals reviewed.  BP (!) 193/64 (BP Location: Right Arm, Patient Position: Sitting)   Pulse (!) 56   Resp 13   Ht 5\' 1"  (1.549 m)   Wt 121 lb (54.9 kg)   BMI 22.86 kg/m   Past Medical History:  Diagnosis Date  . Acute on chronic respiratory failure with hypoxia ([place]) 10/10/2016  . Anxiety   . Arthritis   . AVM (arteriovenous malformation) of colon   . CAD (coronary artery disease)    a. 12/2011 NSTEMI/Cath/PCI LCX (2.25x14 Resolute DES) & D1 (2.25x22 Resolute DES);  b. 01/2012 Cath/PCI: LM 30, LAD 30p, 40-108m, D1 stent ok, 99 in sm branch of diag, LCX patent stent, OM1 20, RCA 95 ost (4.0x12 Promus DES), EF 55%;  c. 04/2012 Lexi Cardiolite  EF 48%, small area of scar @ base/mid inflat wall with mild peri-infarct ischemia.; CABG 12/4  . Carotid artery disease ([place])    a. 00-37% LICA, 0/4888   . Chronic bronchitis ([place])   . Chronic diastolic CHF (congestive heart failure) ([place])    a. 02/2012 Echo EF 60-65%, nl wall motion, Gr 1 DD, mod MR  . Colon cancer ([place]) 1992  . Esophageal  stricture   . ESRD on hemodialysis ([place])    ESRD due to HTN, started dialysis 2011 and gets HD at [facility] with Dr Hinda Lenis on MWF schedule.  Access is LUA AVF as of Sept 2014.   Marland Kitchen GERD (gastroesophageal reflux disease)   . High cholesterol 12/2011  . History of blood transfusion 07/2011; 12/2011; 01/2012 X 2; 04/2012  . History of gout   . History of lower GI bleeding   . Hypertension   . Iron deficiency anemia   . Mitral regurgitation    a. Moderate by echo, 02/2012  . Myocardial infarction ([place])   . Ovarian cancer ([name]) 1992  . Pneumonia ~ 2009    . PUD (peptic ulcer disease)   . TIA (transient ischemic attack)    Social History   Socioeconomic History  . Marital status: Married    Spouse name: Not on file  . Number of children: Not on file  . Years of education: Not on file  . Highest education level: Not on file  Occupational History  . Not on file  Social Needs  . Financial resource strain: Not on file  . Food insecurity:    Worry: Not on file    Inability: Not on file  . Transportation needs:    Medical: Not on file    Non-medical: Not on file  Tobacco Use  . Smoking status: Never Smoker  . Smokeless tobacco: Never Used  Substance and Sexual Activity  . Alcohol use: No    Alcohol/week: 0.0 oz  . Drug use: No  . Sexual activity: Yes    Birth control/protection: Surgical  Lifestyle  . Physical activity:    Days per week: Not on file    Minutes per session: Not on file  . Stress: Not on file  Relationships  . Social connections:    Talks on phone: Not on file    Gets together: Not on file    Attends religious service: Not on file    Active member of club or organization: Not on file    Attends meetings of clubs or organizations: Not on file    Relationship status: Not on file  . Intimate partner violence:    Fear of current or ex partner: Not on file    Emotionally abused: Not on file    Physically abused: Not on file    Forced sexual activity: Not on file  Other Topics Concern  . Not on file  Social History Narrative   Lives in [place], [place] with husband.  Dialysis pt - [facility].   Past Surgical History:  Procedure Laterality Date  . ABDOMINAL HYSTERECTOMY  1992  . APPENDECTOMY  06/1990  . AV FISTULA PLACEMENT  07/2009   left upper arm  . AV FISTULA PLACEMENT Right 09/06/2016   Procedure: RIGHT FOREARM ARTERIOVENOUS (AV) GRAFT;  Surgeon: Elam Dutch, MD;  Location: [facility] OR;  Service: Vascular;  Laterality: Right;  . AV FISTULA PLACEMENT N/A 02/24/2017   Procedure: INSERTION OF ARTERIOVENOUS (AV)  GORE-TEX GRAFT ARM (BRACHIAL AXILLARY);  Surgeon: Katha Cabal, MD;  Location: [facility] ORS;  Service: Vascular;  Laterality: N/A;  . [place] Right 09/06/2016   Procedure: REMOVAL OF Right Arm ARTERIOVENOUS GORETEX GRAFT and Vein Patch angioplasty of brachial artery;  Surgeon: Angelia Mould, MD;  Location: [place];  Service: Vascular;  Laterality: Right;  . COLON RESECTION  1992  . COLON SURGERY    . CORONARY ANGIOPLASTY WITH STENT PLACEMENT  12/15/11   "2"  . CORONARY ANGIOPLASTY WITH STENT PLACEMENT  y/2013   "1; makes total of 3" (05/02/2012)  . CORONARY ARTERY BYPASS GRAFT  06/13/2012   Procedure: CORONARY ARTERY BYPASS GRAFTING (CABG);  Surgeon: Grace Isaac, MD;  Location: [place];  Service: Open Heart Surgery;  Laterality: N/A;  cabg x four;  using left internal mammary artery, and left leg greater saphenous vein harvested endoscopically  . CORONARY STENT INTERVENTION N/A 10/13/2016   Procedure: Coronary Stent Intervention;  Surgeon: Troy Sine, MD;  Location: [place] CV LAB;  Service: Cardiovascular;  Laterality: N/A;  . DIALYSIS/PERMA CATHETER REMOVAL N/A 04/18/2017   Procedure: DIALYSIS/PERMA CATHETER REMOVAL;  Surgeon: Katha Cabal, MD;  Location: [place] CV LAB;  Service: Cardiovascular;  Laterality: N/A;  . DILATION AND CURETTAGE OF UTERUS    . ESOPHAGOGASTRODUODENOSCOPY  01/20/2012   Procedure: ESOPHAGOGASTRODUODENOSCOPY (EGD);  Surgeon: Ladene Artist, MD,FACG;  Location: [facility] ENDOSCOPY;  Service: Endoscopy;  Laterality: N/A;  . ESOPHAGOGASTRODUODENOSCOPY N/A 03/26/2013   Procedure: ESOPHAGOGASTRODUODENOSCOPY (EGD);  Surgeon: Irene Shipper, MD;  Location: [facility] ENDOSCOPY;  Service: Endoscopy;  Laterality: N/A;  . ESOPHAGOGASTRODUODENOSCOPY N/A 04/30/2015   Procedure: ESOPHAGOGASTRODUODENOSCOPY (EGD);  Surgeon: Rogene [place], MD;  [facility];  Service: Endoscopy;  Laterality: N/A;  1pm - moved to 10/20 @ 1:10  . ESOPHAGOGASTRODUODENOSCOPY N/A  07/29/2016   Procedure: ESOPHAGOGASTRODUODENOSCOPY (EGD);  Surgeon: Manus Gunning, MD;  Location: [place];  Service: Gastroenterology;  Laterality: N/A;  enteroscopy  . INTRAOPERATIVE TRANSESOPHAGEAL ECHOCARDIOGRAM  06/13/2012   Procedure: INTRAOPERATIVE TRANSESOPHAGEAL ECHOCARDIOGRAM;  Surgeon: Grace Isaac, MD;  Location: [place];  Service: Open Heart Surgery;  Laterality: N/A;  . IR GENERIC HISTORICAL  07/26/2016   IR FLUORO GUIDE CV LINE RIGHT 07/26/2016 Greggory Keen, MD [facility]INTER[facility]  . IR GENERIC HISTORICAL  07/26/2016   IR US GUIDE VASC ACCESS RIGHT 07/26/2016 Greggory Keen, MD [facility]  . IR GENERIC HISTORICAL  08/02/2016   IR US GUIDE VASC ACCESS RIGHT 08/02/2016 Greggory Keen, MD [facility]  . IR GENERIC HISTORICAL  08/02/2016   IR FLUORO GUIDE CV LINE RIGHT 08/02/2016 Greggory Keen, MD [facility]  . IR RADIOLOGY PERIPHERAL GUIDED IV START  03/28/2017  . IR US GUIDE VASC ACCESS RIGHT  03/28/2017  . LEFT HEART CATH AND CORONARY ANGIOGRAPHY N/A 09/20/2016   Procedure: Left Heart Cath and Coronary Angiography;  Surgeon: Shawna Crome, MD;  Location: [place] CV LAB;  Service: Cardiovascular;  Laterality: N/A;  . LEFT HEART CATH AND CORS/GRAFTS ANGIOGRAPHY N/A 10/13/2016   Procedure: Left Heart Cath and Cors/Grafts Angiography;  Surgeon: Troy Sine, MD;  Location: [place] CV LAB;  Service: Cardiovascular;  Laterality: N/A;  . LEFT HEART CATHETERIZATION WITH CORONARY ANGIOGRAM N/A 12/15/2011   Procedure: LEFT HEART CATHETERIZATION WITH CORONARY ANGIOGRAM;  Surgeon: Burnell Blanks, MD;  Location: [facility] CATH LAB;  Service: Cardiovascular;  Laterality: N/A;  . LEFT HEART CATHETERIZATION WITH CORONARY ANGIOGRAM N/A 01/10/2012   Procedure: LEFT HEART CATHETERIZATION WITH CORONARY ANGIOGRAM;  Surgeon: Peter M Martinique, MD;  Location: [facility] CATH LAB;  Service: Cardiovascular;  Laterality: N/A;  . LEFT HEART CATHETERIZATION WITH CORONARY ANGIOGRAM N/A 06/08/2012   Procedure:  LEFT HEART CATHETERIZATION WITH CORONARY ANGIOGRAM;  Surgeon: Burnell Blanks, MD;  Location: [facility] CATH LAB;  Service: Cardiovascular;  Laterality: N/A;  . LEFT HEART CATHETERIZATION WITH CORONARY/GRAFT ANGIOGRAM N/A 12/10/2013   Procedure: LEFT HEART CATHETERIZATION WITH CORONARY/GRAFT ANGIOGRAM;  Surgeon: Conception Oms  Hassell Done, MD;  Location: [facility] CATH LAB;  Service: Cardiovascular;  Laterality: N/A;  . OVARY SURGERY     ovarian cancer  . REVISION OF ARTERIOVENOUS GORETEX GRAFT N/A 02/24/2017   Procedure: REVISION OF ARTERIOVENOUS GORETEX GRAFT (RESECTION);  Surgeon: Katha Cabal, MD;  Location: [facility] ORS;  Service: Vascular;  Laterality: N/A;  Earney Mallet N/A 10/15/2013   Procedure: Fistulogram;  Surgeon: Serafina Mitchell, MD;  Location: [facility] CATH LAB;  Service: Cardiovascular;  Laterality: N/A;  . THROMBECTOMY / ARTERIOVENOUS GRAFT REVISION  2011   left upper arm  . TUBAL LIGATION  1980's  . UPPER EXTREMITY ANGIOGRAPHY Bilateral 12/06/2016   Procedure: Upper Extremity Angiography;  Surgeon: Katha Cabal, MD;  Location: [place] CV LAB;  Service: Cardiovascular;  Laterality: Bilateral;  . UPPER EXTREMITY INTERVENTION Left 06/06/2017   Procedure: UPPER EXTREMITY INTERVENTION;  Surgeon: Katha Cabal, MD;  Location: [place] CV LAB;  Service: Cardiovascular;  Laterality: Left;   Family History  Problem Relation Age of Onset  . Heart disease Mother        Heart Disease before age 54  . Hyperlipidemia Mother   . Hypertension Mother   . Diabetes Mother   . Heart attack Mother   . Heart disease Father        Heart Disease before age 64  . Hyperlipidemia Father   . Hypertension Father   . Diabetes Father   . Diabetes Sister   . Hypertension Sister   . Diabetes Brother   . Hyperlipidemia Brother   . Heart attack Brother   . Hypertension Sister   . Heart attack Brother   . Other Unknown        noncontributory for early CAD  . Colon cancer Neg Hx   . Esophageal  cancer Neg Hx   . Liver disease Neg Hx   . Kidney disease Neg Hx   . Colon polyps Neg Hx    Allergies  Allergen Reactions  . Aspirin Other (See Comments)    High Doses Mess up her stomach; "makes my bowels have blood in them". Takes 81 mg EC Aspirin   . Penicillins Other (See Comments)    SYNCOPE? , "makes me real weak when I take it; like I'll pass out"  Has patient had a PCN reaction causing immediate rash, facial/tongue/throat swelling, SOB or lightheadedness with hypotension: Yes Has patient had a PCN reaction causing severe rash involving mucus membranes or skin necrosis: no Has patient had a PCN reaction that required hospitalization no Has patient had a PCN reaction occurring within the last 10 years: no If all of the above  . Amlodipine Swelling  . Bactrim [Sulfamethoxazole-Trimethoprim] Rash  . Contrast Media [Iodinated Diagnostic Agents] Itching  . Iron Itching and Other (See Comments)    "they gave me iron in dialysis; had to give me Benadryl cause I had to have the iron" (05/02/2012)  . Nitrofurantoin Hives  . Tylenol [Acetaminophen] Itching and Other (See Comments)    Makes her feet on fire per pt  . Gabapentin Other (See Comments)    Unknown reaction  . Dexilant [Dexlansoprazole] Other (See Comments)    Upset stomach  . Levaquin [Levofloxacin In D5w] Rash  . Morphine And Related Itching and Other (See Comments)    Itching in feet  . Plavix [Clopidogrel Bisulfate] Rash  . Protonix [Pantoprazole Sodium] Rash  . Venofer [Ferric Oxide] Itching and Other (See Comments)    Patient reports using Benadryl prior to  doses as [place]:  Patient presents to review vascular studies.  The patient was last seen on November 02, 2017 and evaluation of left upper extremity shoulder pain.  The patient's symptoms seem to have somewhat improved and only happen on occasion. The patient underwent a duplex ultrasound of the AV access which was notable for a  patent graft without any significant hemodynamic stenosis. Hemodialysis doppler flow is 859. The patient denies any issues with hemodialysis such as cannulation problems, increased bleeding, decrease in doppler flow or recirculation. The patient also denies any graft skin breakdown, pain, edema, pallor or ulceration of the arm / hand.  Patient denies any uremic symptoms.  Patient denies any fever, nausea vomiting.  1. ESRD on hemodialysis ([place]) - Stable Studies reviewed with patient. The patient is doing well and currently has adequate dialysis access. Duplex ultrasound of the AV access shows a patent access with no evidence of hemodynamically significant strictures or stenosis.  The patient should continue to have duplex ultrasounds of the dialysis access every six months. The patient was instructed to call the office in the interim if any issues with dialysis access / doppler flow, pain, edema, pallor, fistula skin breakdown or ulceration of the arm / hand occur. The patient expressed their understanding.  - VAS US DUPLEX DIALYSIS ACCESS (AVF,AVG); Future  2. Symptomatic anemia - Stable Patient presents today asymptomatically Is followed by her nephrologist and primary care physician.  Current Outpatient Medications on File Prior to Visit  Medication Sig Dispense Refill  . allopurinol (ZYLOPRIM) 300 MG tablet Take by mouth.    . ALPRAZolam (XANAX) 0.25 MG tablet Take 1 tablet (0.25 mg total) by mouth 2 (two) times daily as needed for anxiety or sleep. 10 tablet 0  . amiodarone (PACERONE) 200 MG tablet Take 1 tablet (200 mg total) by mouth daily. 30 tablet 3  . aspirin EC 81 MG tablet Take 81 mg by mouth once. FOR CHEST PAIN    . bisacodyl (DULCOLAX) 10 MG suppository Place 1 suppository (10 mg total) rectally as needed for moderate constipation. 12 suppository 0  . carvedilol (COREG) 6.25 MG tablet TAKE 1 TABLET BY MOUTH TWICE DAILY WITH  A  MEAL (Patient taking differently: TAKE 1 TABLET  (6.25 MG) BY MOUTH TWICE DAILY WITH  A  MEAL) 180 tablet 3  . cinacalcet (SENSIPAR) 30 MG tablet Take 30 mg by mouth daily after supper.    . cloNIDine (CATAPRES) 0.2 MG tablet Take 0.1-0.2 mg by mouth daily as needed (high blood pressure).     . cloNIDine (CATAPRES-TTS-2) 0.2 mg/24hr patch Place onto the skin.    Marland Kitchen diphenhydrAMINE (BENADRYL) 25 mg capsule Take 50 mg by mouth daily as needed for itching or allergies.     . diphenoxylate-atropine (LOMOTIL) 2.5-0.025 MG tablet Take 1 tablet by mouth 4 (four) times daily as needed for diarrhea or loose stools. 30 tablet 0  . epoetin alfa (EPOGEN,PROCRIT) 50277 UNIT/ML injection Inject 1 mL (10,000 Units total) into the vein every Monday, Wednesday, and Friday with hemodialysis. 1 mL 10  . escitalopram (LEXAPRO) 10 MG tablet     . fluticasone (FLONASE) 50 MCG/ACT nasal spray Place 1 spray at bedtime as needed into both nostrils for allergies.     Marland Kitchen guaiFENesin (ROBITUSSIN) 100 MG/5ML SOLN Take 5 mLs (100 mg total) by mouth every 6 (six) hours as needed for cough or to loosen phlegm. 1200 mL 0  . hydrALAZINE (  APRESOLINE) 50 MG tablet Take 50 mg by mouth 2 (two) times daily as needed (high blood pressure).    . isosorbide mononitrate (IMDUR) 120 MG 24 hr tablet TAKE 1 TABLET BY MOUTH ONCE DAILY 30 tablet 6  . lanthanum (FOSRENOL) 1000 MG chewable tablet Chew by mouth.    . lidocaine-prilocaine (EMLA) cream Apply 1 application topically every Monday, Wednesday, and Friday. Prior to dialysis    . loratadine (CLARITIN) 10 MG tablet Take 10 mg by mouth daily as needed for allergies.    . multivitamin (RENA-VIT) TABS tablet Take 1 tablet by mouth daily.    Marland Kitchen omeprazole (PRILOSEC) 20 MG capsule Take 1 capsule (20 mg total) by mouth daily. 90 capsule 3  . ondansetron (ZOFRAN-ODT) 4 MG disintegrating tablet Take 4 mg by mouth every 8 (eight) hours as needed for nausea or vomiting.     . sevelamer carbonate (RENVELA) 800 MG tablet Take 800-2,400 mg by mouth See  admin instructions. Take 3 tablets (2400) by mouth twice daily with meals, take 1 tablet (800 mg) with snacks    . simvastatin (ZOCOR) 20 MG tablet TAKE ONE TABLET BY MOUTH AT BEDTIME (Patient taking differently: TAKE ONE TABLET (20 MG) BY MOUTH DAILY AT BEDTIME) 90 tablet 3  . Wheat Dextrin (BENEFIBER DRINK MIX) PACK Take 4 g by mouth at bedtime.    . ferrous sulfate 325 (65 FE) MG EC tablet Take 1 tablet (325 mg total) by mouth 2 (two) times daily with a meal. (Patient taking differently: Take 325 mg by mouth daily. ) 60 tablet 0  . nitroGLYCERIN (NITROSTAT) 0.4 MG SL tablet Place 1 tablet (0.4 mg total) under the tongue every 5 (five) minutes as needed for chest pain. (Patient not taking: Reported on 02/06/2018) 25 tablet 3   No current facility-administered medications on file prior to visit.    There are no Patient Instructions on file for this visit. No follow-ups on file.  Cassondra Stachowski A Saylah Ketner, PA-C

## 2018-02-07 DIAGNOSIS — N186 End stage renal disease: Secondary | ICD-10-CM | POA: Diagnosis not present

## 2018-02-07 DIAGNOSIS — Z992 Dependence on renal dialysis: Secondary | ICD-10-CM | POA: Diagnosis not present

## 2018-02-08 DIAGNOSIS — N186 End stage renal disease: Secondary | ICD-10-CM | POA: Diagnosis not present

## 2018-02-08 DIAGNOSIS — Z992 Dependence on renal dialysis: Secondary | ICD-10-CM | POA: Diagnosis not present

## 2018-02-09 DIAGNOSIS — N186 End stage renal disease: Secondary | ICD-10-CM | POA: Diagnosis not present

## 2018-02-09 DIAGNOSIS — Z992 Dependence on renal dialysis: Secondary | ICD-10-CM | POA: Diagnosis not present

## 2018-02-12 ENCOUNTER — Other Ambulatory Visit: Payer: Self-pay

## 2018-02-12 DIAGNOSIS — N186 End stage renal disease: Secondary | ICD-10-CM | POA: Diagnosis not present

## 2018-02-12 DIAGNOSIS — Z992 Dependence on renal dialysis: Secondary | ICD-10-CM | POA: Diagnosis not present

## 2018-02-12 NOTE — Patient Outreach (Signed)
[facility]) Care Management  02/12/2018  Shawna [name] 01-Feb-1941 [id number]  Case closure: No response from patient after 3 telephone calls and outreach letter attempt.   PLAN; RNCM will close patient due to being unable to reach.  RNCM will send primary MD closure notification.   Quinn Plowman RN,BSN,CCM [facility] Telephonic  [phone]

## 2018-02-13 ENCOUNTER — Other Ambulatory Visit: Payer: Self-pay

## 2018-02-13 ENCOUNTER — Telehealth: Payer: Self-pay | Admitting: Cardiovascular Disease

## 2018-02-13 ENCOUNTER — Encounter (HOSPITAL_COMMUNITY): Payer: Self-pay | Admitting: Emergency Medicine

## 2018-02-13 ENCOUNTER — Observation Stay (HOSPITAL_COMMUNITY)
Admission: EM | Admit: 2018-02-13 | Discharge: 2018-02-15 | Disposition: A | Discharge status: Home / Self Care | Payer: Medicare HMO | Attending: Internal Medicine | Admitting: Internal Medicine

## 2018-02-13 ENCOUNTER — Emergency Department (HOSPITAL_COMMUNITY): Payer: Medicare HMO

## 2018-02-13 DIAGNOSIS — I2510 Atherosclerotic heart disease of native coronary artery without angina pectoris: Secondary | ICD-10-CM | POA: Insufficient documentation

## 2018-02-13 DIAGNOSIS — R079 Chest pain, unspecified: Secondary | ICD-10-CM | POA: Diagnosis not present

## 2018-02-13 DIAGNOSIS — Z85038 Personal history of other malignant neoplasm of large intestine: Secondary | ICD-10-CM | POA: Insufficient documentation

## 2018-02-13 DIAGNOSIS — I5032 Chronic diastolic (congestive) heart failure: Secondary | ICD-10-CM | POA: Insufficient documentation

## 2018-02-13 DIAGNOSIS — Z7982 Long term (current) use of aspirin: Secondary | ICD-10-CM | POA: Diagnosis not present

## 2018-02-13 DIAGNOSIS — Z992 Dependence on renal dialysis: Secondary | ICD-10-CM | POA: Diagnosis not present

## 2018-02-13 DIAGNOSIS — I132 Hypertensive heart and chronic kidney disease with heart failure and with stage 5 chronic kidney disease, or end stage renal disease: Secondary | ICD-10-CM | POA: Diagnosis not present

## 2018-02-13 DIAGNOSIS — Z7901 Long term (current) use of anticoagulants: Secondary | ICD-10-CM | POA: Diagnosis not present

## 2018-02-13 DIAGNOSIS — I200 Unstable angina: Secondary | ICD-10-CM | POA: Diagnosis not present

## 2018-02-13 DIAGNOSIS — N186 End stage renal disease: Secondary | ICD-10-CM | POA: Insufficient documentation

## 2018-02-13 DIAGNOSIS — Z8673 Personal history of transient ischemic attack (TIA), and cerebral infarction without residual deficits: Secondary | ICD-10-CM | POA: Diagnosis not present

## 2018-02-13 DIAGNOSIS — Z79899 Other long term (current) drug therapy: Secondary | ICD-10-CM | POA: Diagnosis not present

## 2018-02-13 DIAGNOSIS — Z951 Presence of aortocoronary bypass graft: Secondary | ICD-10-CM | POA: Diagnosis not present

## 2018-02-13 DIAGNOSIS — R0789 Other chest pain: Secondary | ICD-10-CM | POA: Diagnosis not present

## 2018-02-13 DIAGNOSIS — I5022 Chronic systolic (congestive) heart failure: Secondary | ICD-10-CM | POA: Diagnosis present

## 2018-02-13 DIAGNOSIS — I10 Essential (primary) hypertension: Secondary | ICD-10-CM | POA: Diagnosis present

## 2018-02-13 DIAGNOSIS — I5042 Chronic combined systolic (congestive) and diastolic (congestive) heart failure: Secondary | ICD-10-CM | POA: Diagnosis present

## 2018-02-13 LAB — CBC
HCT: 37 % (ref 36.0–46.0)
HEMOGLOBIN: 11.4 g/dL — AB (ref 12.0–15.0)
MCH: 32.7 pg (ref 26.0–34.0)
MCHC: 30.8 g/dL (ref 30.0–36.0)
MCV: 106 fL — ABNORMAL HIGH (ref 78.0–100.0)
Platelets: 146 10*3/uL — ABNORMAL LOW (ref 150–400)
RBC: 3.49 MIL/uL — AB (ref 3.87–5.11)
RDW: 15.6 % — ABNORMAL HIGH (ref 11.5–15.5)
WBC: 5.5 10*3/uL (ref 4.0–10.5)

## 2018-02-13 LAB — BASIC METABOLIC PANEL
ANION GAP: 12 (ref 5–15)
BUN: 48 mg/dL — ABNORMAL HIGH (ref 8–23)
CALCIUM: 8.7 mg/dL — AB (ref 8.9–10.3)
CO2: 26 mmol/L (ref 22–32)
Chloride: 97 mmol/L — ABNORMAL LOW (ref 98–111)
Creatinine, Ser: 6.88 mg/dL — ABNORMAL HIGH (ref 0.44–1.00)
GFR calc non Af Amer: 5 mL/min — ABNORMAL LOW (ref >60)
GFR, EST AFRICAN AMERICAN: 6 mL/min — AB (ref >60)
Glucose, Bld: 104 mg/dL — ABNORMAL HIGH (ref 70–99)
Potassium: 4.8 mmol/L (ref 3.5–5.1)
Sodium: 135 mmol/L (ref 135–145)

## 2018-02-13 LAB — TROPONIN I
TROPONIN I: 0.03 ng/mL — AB (ref <0.03)
TROPONIN I: 0.04 ng/mL — AB (ref <0.03)
Troponin I: 0.04 ng/mL (ref <0.03)
Troponin I: 0.03 ng/mL (ref <0.03)
Troponin I: 0.04 ng/mL (ref <0.03)

## 2018-02-13 IMAGING — DX DG CHEST 2V
2 series · 2 of 2 positions shown · non-contrast
Comparison: Chest x-rays dated [DATE] and [DATE]

CLINICAL DATA: Chest pain.

EXAM:
CHEST - 2 VIEW

[chest pa]
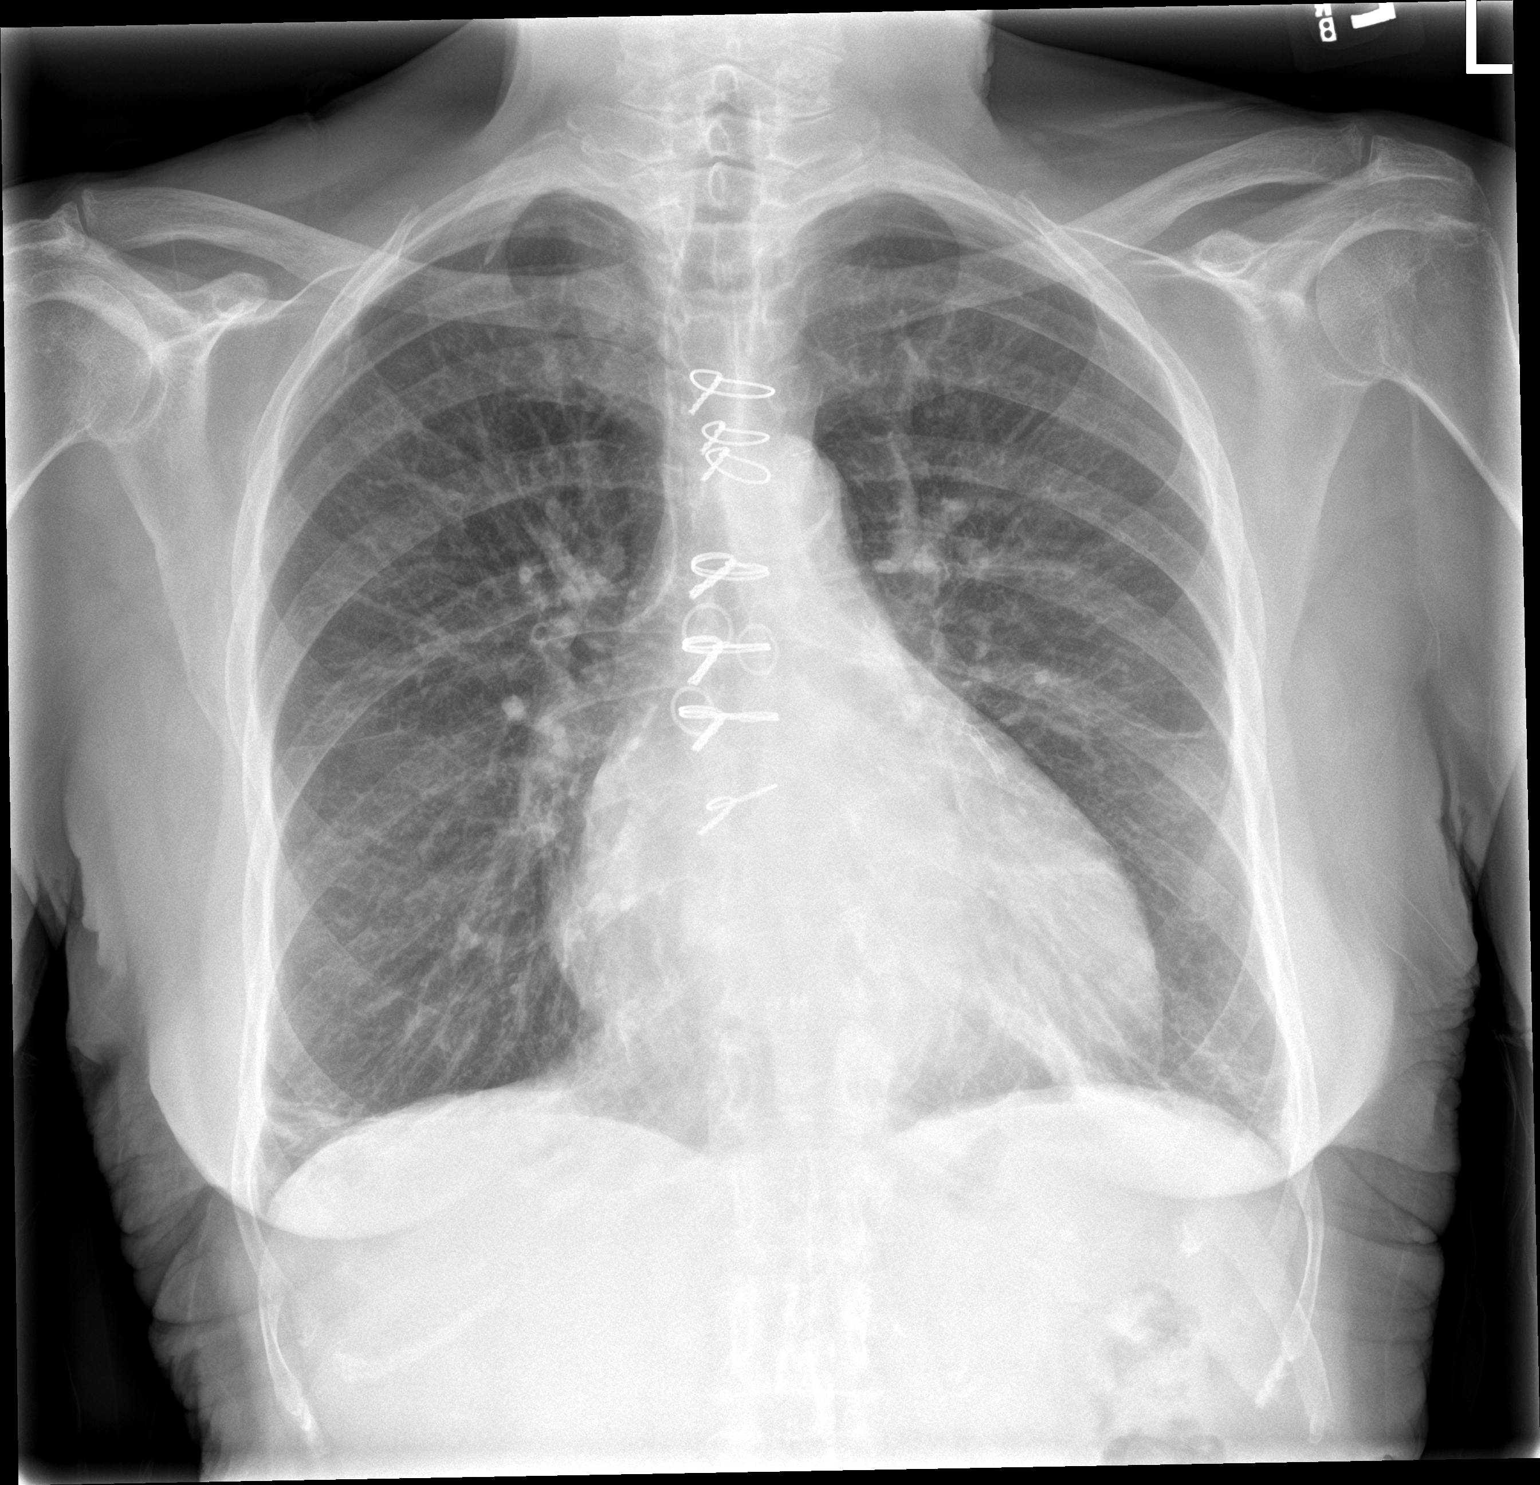

[chest lat]
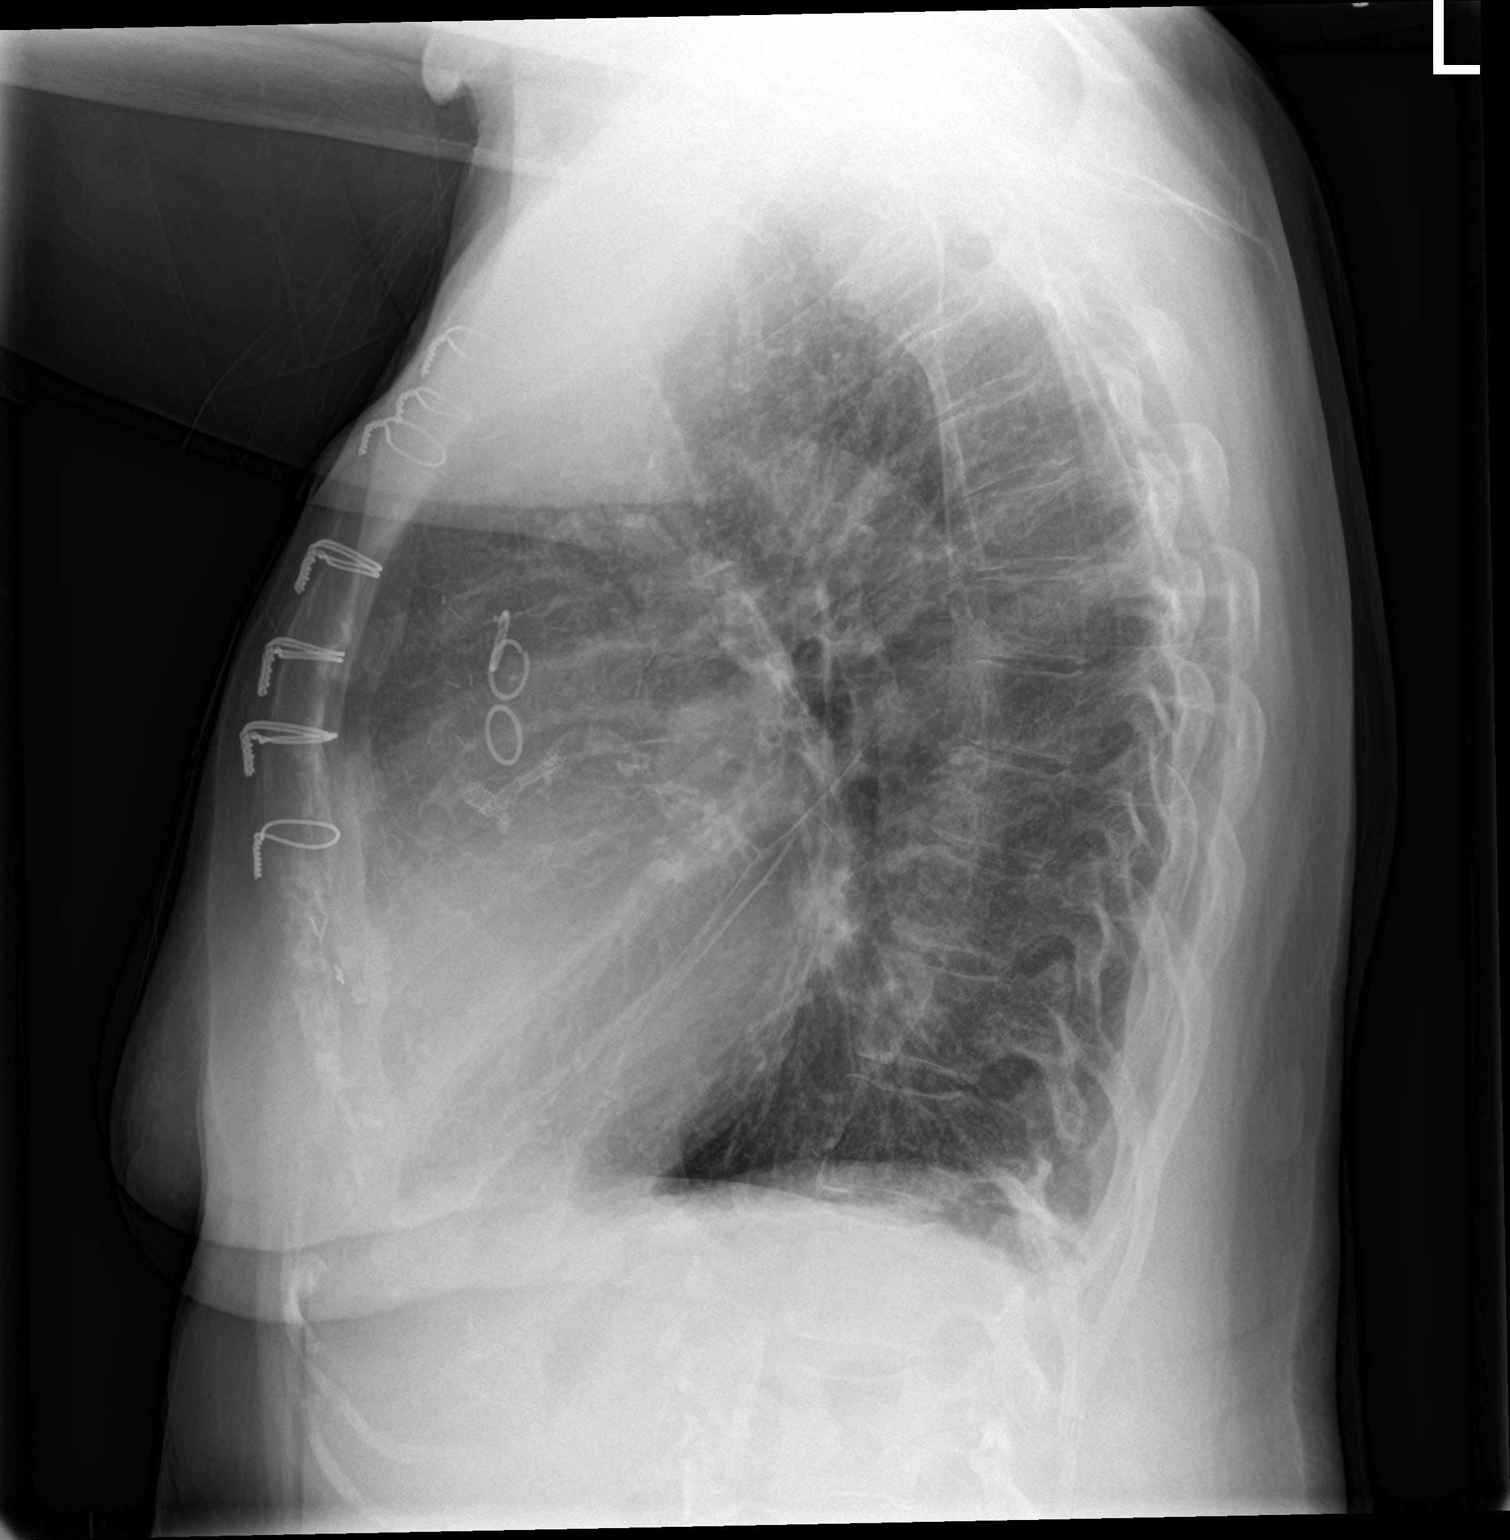

[2 of 2 positions shown; findings below may reference images not displayed]

FINDINGS: Chronic cardiomegaly.  CABG.  Coronary artery stents in place.

New atelectasis at the left lung base. Scarring at the right lung
base laterally. Chronic peribronchial thickening. No discrete
effusions. No acute bone abnormality.

Aortic atherosclerosis.
IMPRESSION: 1. New slight linear atelectasis at the left lung base.
2. Chronic cardiomegaly.
3. Chronic scarring at the right lung base.
4. Chronic peribronchial thickening.

## 2018-02-13 MED ORDER — FLUTICASONE PROPIONATE 50 MCG/ACT NA SUSP
1.0000 | Freq: Every evening | NASAL | Status: DC | PRN
  Filled 2018-02-13: qty 16

## 2018-02-13 MED ORDER — SEVELAMER CARBONATE 800 MG PO TABS
2400.0000 mg | ORAL_TABLET | Freq: Two times a day (BID) | ORAL | Status: DC
  Administered 2018-02-13 – 2018-02-15 (×3): 2400 mg via ORAL
  Filled 2018-02-13 (×5): qty 3

## 2018-02-13 MED ORDER — CINACALCET HCL 30 MG PO TABS
30.0000 mg | ORAL_TABLET | Freq: Every day | ORAL | Status: DC
  Administered 2018-02-13 – 2018-02-15 (×2): 30 mg via ORAL
  Filled 2018-02-13 (×2): qty 1

## 2018-02-13 MED ORDER — DIPHENHYDRAMINE HCL 25 MG PO CAPS: 50.0000 mg | ORAL_CAPSULE | Freq: Every day | ORAL | Status: DC | PRN

## 2018-02-13 MED ORDER — GUAIFENESIN 100 MG/5ML PO SOLN
5.0000 mL | Freq: Four times a day (QID) | ORAL | Status: DC | PRN
  Filled 2018-02-13: qty 5

## 2018-02-13 MED ORDER — RENA-VITE PO TABS
1.0000 | ORAL_TABLET | Freq: Every day | ORAL | Status: DC
  Administered 2018-02-14: 1 via ORAL
  Filled 2018-02-13 (×3): qty 1

## 2018-02-13 MED ORDER — DIPHENOXYLATE-ATROPINE 2.5-0.025 MG PO TABS: 1.0000 | ORAL_TABLET | Freq: Four times a day (QID) | ORAL | Status: DC | PRN

## 2018-02-13 MED ORDER — AMIODARONE HCL 200 MG PO TABS
200.0000 mg | ORAL_TABLET | Freq: Every day | ORAL | Status: DC
  Administered 2018-02-13 – 2018-02-15 (×3): 200 mg via ORAL
  Filled 2018-02-13 (×3): qty 1

## 2018-02-13 MED ORDER — LIDOCAINE-PRILOCAINE 2.5-2.5 % EX CREA: 1.0000 "application " | TOPICAL_CREAM | CUTANEOUS | Status: DC

## 2018-02-13 MED ORDER — NITROGLYCERIN 2 % TD OINT
0.5000 [in_us] | TOPICAL_OINTMENT | Freq: Once | TRANSDERMAL | Status: AC
Start: 2018-02-13 — End: 2018-02-13
  Administered 2018-02-13: 0.5 [in_us] via TOPICAL
  Filled 2018-02-13: qty 1

## 2018-02-13 MED ORDER — SEVELAMER CARBONATE 800 MG PO TABS
2400.0000 mg | ORAL_TABLET | Freq: Three times a day (TID) | ORAL | Status: DC
  Filled 2018-02-13 (×3): qty 3

## 2018-02-13 MED ORDER — NITROGLYCERIN 0.4 MG SL SUBL: 0.4000 mg | SUBLINGUAL_TABLET | SUBLINGUAL | Status: DC | PRN

## 2018-02-13 MED ORDER — CLONIDINE HCL 0.1 MG PO TABS: 0.1000 mg | ORAL_TABLET | Freq: Every day | ORAL | Status: DC | PRN

## 2018-02-13 MED ORDER — ISOSORBIDE MONONITRATE ER 60 MG PO TB24
120.0000 mg | ORAL_TABLET | Freq: Every day | ORAL | Status: DC
  Administered 2018-02-13 – 2018-02-15 (×3): 120 mg via ORAL
  Filled 2018-02-13 (×4): qty 2

## 2018-02-13 MED ORDER — TICAGRELOR 60 MG PO TABS
60.0000 mg | ORAL_TABLET | Freq: Two times a day (BID) | ORAL | Status: DC
  Administered 2018-02-13 – 2018-02-14 (×2): 60 mg via ORAL
  Filled 2018-02-13 (×7): qty 1

## 2018-02-13 MED ORDER — BISACODYL 10 MG RE SUPP: 10.0000 mg | RECTAL | Status: DC | PRN

## 2018-02-13 MED ORDER — PSYLLIUM 95 % PO PACK
1.0000 | PACK | Freq: Every day | ORAL | Status: DC
  Filled 2018-02-13 (×3): qty 1

## 2018-02-13 MED ORDER — ONDANSETRON HCL 4 MG/2ML IJ SOLN
4.0000 mg | Freq: Four times a day (QID) | INTRAMUSCULAR | Status: DC | PRN
  Filled 2018-02-13: qty 2

## 2018-02-13 MED ORDER — GI COCKTAIL ~~LOC~~: 30.0000 mL | Freq: Four times a day (QID) | ORAL | Status: DC | PRN

## 2018-02-13 MED ORDER — ALPRAZOLAM 0.25 MG PO TABS
0.2500 mg | ORAL_TABLET | Freq: Two times a day (BID) | ORAL | Status: DC | PRN
Start: 2018-02-13 — End: 2018-02-15
  Administered 2018-02-14 – 2018-02-15 (×2): 0.25 mg via ORAL
  Filled 2018-02-13 (×2): qty 1

## 2018-02-13 MED ORDER — LORATADINE 10 MG PO TABS
10.0000 mg | ORAL_TABLET | Freq: Every day | ORAL | Status: DC | PRN
Start: 2018-02-13 — End: 2018-02-15
  Administered 2018-02-14 – 2018-02-15 (×2): 10 mg via ORAL
  Filled 2018-02-13 (×3): qty 1

## 2018-02-13 MED ORDER — ACETAMINOPHEN 325 MG PO TABS: 650.0000 mg | ORAL_TABLET | ORAL | Status: DC | PRN

## 2018-02-13 MED ORDER — CLONIDINE HCL 0.2 MG PO TABS
0.2000 mg | ORAL_TABLET | Freq: Once | ORAL | Status: AC
  Administered 2018-02-13: 0.2 mg via ORAL
  Filled 2018-02-13: qty 1

## 2018-02-13 MED ORDER — SEVELAMER CARBONATE 800 MG PO TABS
800.0000 mg | ORAL_TABLET | ORAL | Status: DC
  Administered 2018-02-13: 800 mg via ORAL
  Filled 2018-02-13 (×3): qty 1

## 2018-02-13 MED ORDER — HYDRALAZINE HCL 25 MG PO TABS: 50.0000 mg | ORAL_TABLET | Freq: Two times a day (BID) | ORAL | Status: DC | PRN

## 2018-02-13 MED ORDER — ONDANSETRON 4 MG PO TBDP
4.0000 mg | ORAL_TABLET | Freq: Three times a day (TID) | ORAL | Status: DC | PRN
Start: 2018-02-13 — End: 2018-02-15

## 2018-02-13 MED ORDER — ASPIRIN EC 81 MG PO TBEC
81.0000 mg | DELAYED_RELEASE_TABLET | Freq: Once | ORAL | Status: AC
  Administered 2018-02-13: 81 mg via ORAL
  Filled 2018-02-13: qty 1

## 2018-02-13 NOTE — ED Provider Notes (Signed)
[facility] EMERGENCY DEPARTMENT Provider Note   CSN: [id number] Arrival date & time: 02/13/18  1150     History   Chief Complaint Chief Complaint  Patient presents with  . Chest Pain    HPI Shawna [name] is a 77 y.o. female.  Patient states that she had chest pressure shortness of breath and sweating for about 2 hours today.  She is taking 2 nitros and symptoms have almost completely disappeared.  The history is provided by the patient. No language interpreter was used.  Chest Pain   This is a new problem. The current episode started 1 to 2 hours ago. The problem occurs constantly. The problem has been resolved. The pain is associated with exertion. The pain is at a severity of 5/10. The pain is moderate. The quality of the pain is described as dull. The pain does not radiate. The symptoms are aggravated by exertion. Pertinent negatives include no abdominal pain, no back pain, no cough and no headaches.  Pertinent negatives for past medical history include no seizures.    Past Medical History:  Diagnosis Date  . Acute on chronic respiratory failure with hypoxia ([place]) 10/10/2016  . Anxiety   . Arthritis   . AVM (arteriovenous malformation) of colon   . CAD (coronary artery disease)    a. 12/2011 NSTEMI/Cath/PCI LCX (2.25x14 Resolute DES) & D1 (2.25x22 Resolute DES);  b. 01/2012 Cath/PCI: LM 30, LAD 30p, 40-16m, D1 stent ok, 99 in sm branch of diag, LCX patent stent, OM1 20, RCA 95 ost (4.0x12 Promus DES), EF 55%;  c. 04/2012 Lexi Cardiolite  EF 48%, small area of scar @ base/mid inflat wall with mild peri-infarct ischemia.; CABG 12/4  . Carotid artery disease ([place])    a. 17-00% LICA, 07/7492   . Chronic bronchitis ([place])   . Chronic diastolic CHF (congestive heart failure) ([place])    a. 02/2012 Echo EF 60-65%, nl wall motion, Gr 1 DD, mod MR  . Colon cancer ([place]) 1992  . Esophageal stricture   . ESRD on hemodialysis ([place])    ESRD due to HTN, started dialysis 2011 and gets HD at  [facility] with Dr Hinda Lenis on MWF schedule.  Access is LUA AVF as of Sept 2014.   Marland Kitchen GERD (gastroesophageal reflux disease)   . High cholesterol 12/2011  . History of blood transfusion 07/2011; 12/2011; 01/2012 X 2; 04/2012  . History of gout   . History of lower GI bleeding   . Hypertension   . Iron deficiency anemia   . Mitral regurgitation    a. Moderate by echo, 02/2012  . Myocardial infarction ([place])   . Ovarian cancer ([place]) 1992  . Pneumonia ~ 2009  . PUD (peptic ulcer disease)   . TIA (transient ischemic attack)     Patient Active Problem List   Diagnosis Date Noted  . Hand steal syndrome ([place]) 08/01/2017  . Anemia 07/14/2017  . Coronary artery disease of bypass graft of native heart with stable angina pectoris ([place]) 06/05/2017  . Mesenteric ischemia ([place])   . Diverticulitis   . SBO (small bowel obstruction) ([place]) 03/22/2017  . Enteritis   . Complication of vascular access for dialysis 03/19/2017  . Preoperative clearance 01/25/2017  . Symptomatic anemia 10/24/2016  . H/O non-ST elevation myocardial infarction (NSTEMI) 10/24/2016  . Fluid overload 10/10/2016  . Complication from renal dialysis device 10/10/2016  . Non-ST elevation MI (NSTEMI) ([place])   . Encounter for fitting and adjustment of vascular catheter   .  End stage renal disease ([place])   . ESRD (end stage renal disease) ([place])   . Heme positive stool   . Demand ischemia ([place]) 07/27/2016  . Hypertensive emergency 07/08/2016  . Acute on chronic respiratory failure with hypoxia ([place])   . Cardiac arrest ([place])   . Palliative care encounter   . Goals of care, counseling/discussion   . Hypertensive crisis without congestive heart failure 05/09/2016  . Acute pulmonary edema ([place]) 04/06/2016  . Acute respiratory failure ([place]) 04/06/2016  . Hypertensive crisis 01/27/2016  . History of colon cancer 01/27/2016  . History of ovarian cancer 01/27/2016  . Hypertensive urgency 01/27/2016  . Narrow complex tachycardia ([place])  09/08/2015  . SVT (supraventricular tachycardia) ([place]) 09/08/2015  . Influenza A 08/30/2015  . Acute on chronic diastolic CHF (congestive heart failure) ([place]) 05/04/2015  . Unstable angina ([place]) 05/03/2015  . Essential hypertension   . Pain in joint, lower leg 08/14/2014  . Chest pain 11/26/2013  . Small bowel obstruction, partial ([place]) 05/29/2013  . Chronic diastolic CHF (congestive heart failure) ([place]) 03/22/2013  . GI bleeding 03/21/2013  . Acute blood loss anemia 03/21/2013  . Vaginal odor 03/12/2013  . Vaginal discharge 03/12/2013  . Occlusion and stenosis of carotid artery without mention of cerebral infarction 01/24/2013  . Hx of CABG 07/05/2012  . Carotid artery disease ([place]) 07/05/2012  . Anemia of chronic kidney failure 07/03/2012  . Secondary hyperparathyroidism ([place]) 07/03/2012  . Mitral regurgitation 06/12/2012  . Pneumonia 06/09/2012  . Non-STEMI (non-ST elevated myocardial infarction) ([place]) 06/08/2012  . 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 GERD (gastroesophageal reflux disease) 01/09/2012  . HLD (hyperlipidemia) 01/05/2012  . Atherosclerosis of native coronary artery of native heart without angina pectoris 12/16/2011  . ESRD on hemodialysis ([place]) 12/16/2011    Past Surgical History:  Procedure Laterality Date  . ABDOMINAL HYSTERECTOMY  1992  . APPENDECTOMY  06/1990  . AV FISTULA PLACEMENT  07/2009   left upper arm  . AV FISTULA PLACEMENT Right 09/06/2016   Procedure: RIGHT FOREARM ARTERIOVENOUS (AV) GRAFT;  Surgeon: Elam Dutch, MD;  Location: [facility] OR;  Service: Vascular;  Laterality: Right;  . AV FISTULA PLACEMENT N/A 02/24/2017   Procedure: INSERTION OF ARTERIOVENOUS (AV) GORE-TEX GRAFT ARM (BRACHIAL AXILLARY);  Surgeon: Katha Cabal, MD;  Location: [facility] ORS;  Service: Vascular;  Laterality: N/A;  . [place] Right 09/06/2016   Procedure: REMOVAL OF Right Arm ARTERIOVENOUS GORETEX GRAFT and Vein Patch angioplasty of  brachial artery;  Surgeon: Angelia Mould, MD;  Location: [place];  Service: Vascular;  Laterality: Right;  . COLON RESECTION  1992  . COLON SURGERY    . CORONARY ANGIOPLASTY WITH STENT PLACEMENT  12/15/11   "2"  . CORONARY ANGIOPLASTY WITH STENT PLACEMENT  y/2013   "1; makes total of 3" (05/02/2012)  . CORONARY ARTERY BYPASS GRAFT  06/13/2012   Procedure: CORONARY ARTERY BYPASS GRAFTING (CABG);  Surgeon: Grace Isaac, MD;  Location: [place];  Service: Open Heart Surgery;  Laterality: N/A;  cabg x four;  using left internal mammary artery, and left leg greater saphenous vein harvested endoscopically  . CORONARY STENT INTERVENTION N/A 10/13/2016   Procedure: Coronary Stent Intervention;  Surgeon: Troy Sine, MD;  Location: [place] CV LAB;  Service: Cardiovascular;  Laterality: N/A;  . DIALYSIS/PERMA CATHETER REMOVAL N/A 04/18/2017   Procedure: DIALYSIS/PERMA CATHETER REMOVAL;  Surgeon: Katha Cabal, MD;  Location: [place] CV LAB;  Service: Cardiovascular;  Laterality:  N/A;  . DILATION AND CURETTAGE OF UTERUS    . ESOPHAGOGASTRODUODENOSCOPY  01/20/2012   Procedure: ESOPHAGOGASTRODUODENOSCOPY (EGD);  Surgeon: Ladene Artist, MD,FACG;  Location: [facility] ENDOSCOPY;  Service: Endoscopy;  Laterality: N/A;  . ESOPHAGOGASTRODUODENOSCOPY N/A 03/26/2013   Procedure: ESOPHAGOGASTRODUODENOSCOPY (EGD);  Surgeon: Irene Shipper, MD;  Location: [facility] ENDOSCOPY;  Service: Endoscopy;  Laterality: N/A;  . ESOPHAGOGASTRODUODENOSCOPY N/A 04/30/2015   Procedure: ESOPHAGOGASTRODUODENOSCOPY (EGD);  Surgeon: Rogene Houston, MD;  [facility];  Service: Endoscopy;  Laterality: N/A;  1pm - moved to 10/20 @ 1:10  . ESOPHAGOGASTRODUODENOSCOPY N/A 07/29/2016   Procedure: ESOPHAGOGASTRODUODENOSCOPY (EGD);  Surgeon: Manus Gunning, MD;  Location: [place];  Service: Gastroenterology;  Laterality: N/A;  enteroscopy  . INTRAOPERATIVE TRANSESOPHAGEAL ECHOCARDIOGRAM  06/13/2012   Procedure:  INTRAOPERATIVE TRANSESOPHAGEAL ECHOCARDIOGRAM;  Surgeon: Grace Isaac, MD;  Location: [place];  Service: Open Heart Surgery;  Laterality: N/A;  . IR GENERIC HISTORICAL  07/26/2016   IR FLUORO GUIDE CV LINE RIGHT 07/26/2016 Greggory Keen, MD [facility]INTER[facility]  . IR GENERIC HISTORICAL  07/26/2016   IR US GUIDE VASC ACCESS RIGHT 07/26/2016 Greggory Keen, MD [facility]  . IR GENERIC HISTORICAL  08/02/2016   IR US GUIDE VASC ACCESS RIGHT 08/02/2016 Greggory Keen, MD [facility]  . IR GENERIC HISTORICAL  08/02/2016   IR FLUORO GUIDE CV LINE RIGHT 08/02/2016 Greggory Keen, MD [facility]  . IR RADIOLOGY PERIPHERAL GUIDED IV START  03/28/2017  . IR US GUIDE VASC ACCESS RIGHT  03/28/2017  . LEFT HEART CATH AND CORONARY ANGIOGRAPHY N/A 09/20/2016   Procedure: Left Heart Cath and Coronary Angiography;  Surgeon: [name], MD;  Location: [place] CV LAB;  Service: Cardiovascular;  Laterality: N/A;  . LEFT HEART CATH AND CORS/GRAFTS ANGIOGRAPHY N/A 10/13/2016   Procedure: Left Heart Cath and Cors/Grafts Angiography;  Surgeon: Troy Sine, MD;  Location: [place] CV LAB;  Service: Cardiovascular;  Laterality: N/A;  . LEFT HEART CATHETERIZATION WITH CORONARY ANGIOGRAM N/A 12/15/2011   Procedure: LEFT HEART CATHETERIZATION WITH CORONARY ANGIOGRAM;  Surgeon: Burnell Blanks, MD;  Location: [facility] CATH LAB;  Service: Cardiovascular;  Laterality: N/A;  . LEFT HEART CATHETERIZATION WITH CORONARY ANGIOGRAM N/A 01/10/2012   Procedure: LEFT HEART CATHETERIZATION WITH CORONARY ANGIOGRAM;  Surgeon: Peter M Martinique, MD;  Location: [facility] CATH LAB;  Service: Cardiovascular;  Laterality: N/A;  . LEFT HEART CATHETERIZATION WITH CORONARY ANGIOGRAM N/A 06/08/2012   Procedure: LEFT HEART CATHETERIZATION WITH CORONARY ANGIOGRAM;  Surgeon: Burnell Blanks, MD;  Location: [facility] CATH LAB;  Service: Cardiovascular;  Laterality: N/A;  . LEFT HEART CATHETERIZATION WITH CORONARY/GRAFT ANGIOGRAM N/A 12/10/2013   Procedure: LEFT  HEART CATHETERIZATION WITH Beatrix Fetters;  Surgeon: [name], MD;  Location: [facility] CATH LAB;  Service: Cardiovascular;  Laterality: N/A;  . OVARY SURGERY     ovarian cancer  . REVISION OF ARTERIOVENOUS GORETEX GRAFT N/A 02/24/2017   Procedure: REVISION OF ARTERIOVENOUS GORETEX GRAFT (RESECTION);  Surgeon: Katha Cabal, MD;  Location: [facility] ORS;  Service: Vascular;  Laterality: N/A;  Earney Mallet N/A 10/15/2013   Procedure: Fistulogram;  Surgeon: Serafina Mitchell, MD;  Location: [facility] CATH LAB;  Service: Cardiovascular;  Laterality: N/A;  . THROMBECTOMY / ARTERIOVENOUS GRAFT REVISION  2011   left upper arm  . TUBAL LIGATION  1980's  . UPPER EXTREMITY ANGIOGRAPHY Bilateral 12/06/2016   Procedure: Upper Extremity Angiography;  Surgeon: Katha Cabal, MD;  Location: [place] CV LAB;  Service: Cardiovascular;  Laterality:  Bilateral;  . UPPER EXTREMITY INTERVENTION Left 06/06/2017   Procedure: UPPER EXTREMITY INTERVENTION;  Surgeon: Katha Cabal, MD;  Location: [place] CV LAB;  Service: Cardiovascular;  Laterality: Left;     OB History    Gravida  2   Para  2   Term      Preterm  2   AB      Living  2     SAB      TAB      Ectopic      Multiple      Live Births               Home Medications    Prior to Admission medications   Medication Sig Start Date End Date Taking? Authorizing Provider  ALPRAZolam (XANAX) 0.25 MG tablet Take 1 tablet (0.25 mg total) by mouth 2 (two) times daily as needed for anxiety or sleep. 04/05/17  Yes Arrien, Jimmy Picket, MD  amiodarone (PACERONE) 200 MG tablet Take 1 tablet (200 mg total) by mouth daily. 12/08/16  Yes Herminio Commons, MD  aspirin EC 81 MG tablet Take 81 mg by mouth once. FOR CHEST PAIN   Yes [provider]  bisacodyl (DULCOLAX) 10 MG suppository Place 1 suppository (10 mg total) rectally as needed for moderate constipation. 11/21/17  Yes Rehman, Mechele Dawley, MD  BRILINTA 60  MG TABS tablet TAKE 1 TABLET TWICE DAILY 02/06/18  Yes Herminio Commons, MD  carvedilol (COREG) 6.25 MG tablet TAKE 1 TABLET BY MOUTH TWICE DAILY WITH  A  MEAL Patient taking differently: TAKE 1 TABLET (6.25 MG) BY MOUTH TWICE DAILY WITH  A  MEAL 06/15/17  Yes Herminio Commons, MD  cinacalcet (SENSIPAR) 30 MG tablet Take 30 mg by mouth daily after supper.   Yes [provider]  cloNIDine (CATAPRES) 0.2 MG tablet Take 0.1-0.2 mg by mouth daily as needed (high blood pressure).    Yes [provider]  diphenhydrAMINE (BENADRYL) 25 mg capsule Take 50 mg by mouth daily as needed for itching or allergies.    Yes [provider]  diphenoxylate-atropine (LOMOTIL) 2.5-0.025 MG tablet Take 1 tablet by mouth 4 (four) times daily as needed for diarrhea or loose stools. 08/13/17  Yes Tanna Furry, MD  epoetin alfa (EPOGEN,PROCRIT) 40814 UNIT/ML injection Inject 1 mL (10,000 Units total) into the vein every Monday, Wednesday, and Friday with hemodialysis. 04/11/16  Yes Orvan Falconer, MD  escitalopram (LEXAPRO) 10 MG tablet  11/16/17  Yes [provider]  ferrous sulfate 325 (65 FE) MG EC tablet Take 1 tablet (325 mg total) by mouth 2 (two) times daily with a meal. Patient taking differently: Take 325 mg by mouth daily with breakfast.  07/15/17 02/13/18 Yes Johnson, Clanford L, MD  fluticasone (FLONASE) 50 MCG/ACT nasal spray Place 1 spray at bedtime as needed into both nostrils for allergies.    Yes [provider]  hydrALAZINE (APRESOLINE) 50 MG tablet Take 50 mg by mouth 2 (two) times daily as needed (high blood pressure).   Yes [provider]  isosorbide mononitrate (IMDUR) 120 MG 24 hr tablet TAKE 1 TABLET BY MOUTH ONCE DAILY 12/01/17  Yes Herminio Commons, MD  lanthanum (FOSRENOL) 1000 MG chewable tablet Chew by mouth. 04/29/11  Yes [provider]  lidocaine-prilocaine (EMLA) cream Apply 1 application topically every Monday, Wednesday, and Friday.  Prior to dialysis   Yes [provider]  loratadine (CLARITIN) 10 MG tablet Take 10 mg  by mouth daily as needed for allergies.   Yes [provider]  multivitamin (RENA-VIT) TABS tablet Take 1 tablet by mouth daily.   Yes [provider]  omeprazole (PRILOSEC) 20 MG capsule Take 1 capsule (20 mg total) by mouth daily. 11/21/17  Yes Rehman, Mechele Dawley, MD  ondansetron (ZOFRAN-ODT) 4 MG disintegrating tablet Take 4 mg by mouth every 8 (eight) hours as needed for nausea or vomiting.  07/12/17  Yes [provider]  sevelamer carbonate (RENVELA) 800 MG tablet Take 800-2,400 mg by mouth See admin instructions. Take 3 tablets (2400) by mouth twice daily with meals, take 1 tablet (800 mg) with snacks   Yes [provider]  simvastatin (ZOCOR) 20 MG tablet TAKE ONE TABLET BY MOUTH AT BEDTIME Patient taking differently: TAKE ONE TABLET (20 MG) BY MOUTH DAILY AT BEDTIME 02/13/17  Yes Herminio Commons, MD  guaiFENesin (ROBITUSSIN) 100 MG/5ML SOLN Take 5 mLs (100 mg total) by mouth every 6 (six) hours as needed for cough or to loosen phlegm. Patient not taking: Reported on 02/13/2018 10/16/17   Arrien, Jimmy Picket, MD  nitroGLYCERIN (NITROSTAT) 0.4 MG SL tablet Place 1 tablet (0.4 mg total) under the tongue every 5 (five) minutes as needed for chest pain. Patient not taking: Reported on 02/06/2018 06/05/17   Herminio Commons, MD  Wheat Dextrin (BENEFIBER DRINK MIX) PACK Take 4 g by mouth at bedtime. Patient not taking: Reported on 02/13/2018 11/21/17   Rogene Houston, MD    Family History Family History  Problem Relation Age of Onset  . Heart disease Mother        Heart Disease before age 25  . Hyperlipidemia Mother   . Hypertension Mother   . Diabetes Mother   . Heart attack Mother   . Heart disease Father        Heart Disease before age 28  . Hyperlipidemia Father   . Hypertension Father   . Diabetes Father   . Diabetes Sister   . Hypertension Sister     . Diabetes Brother   . Hyperlipidemia Brother   . Heart attack Brother   . Hypertension Sister   . Heart attack Brother   . Other Unknown        noncontributory for early CAD  . Colon cancer Neg Hx   . Esophageal cancer Neg Hx   . Liver disease Neg Hx   . Kidney disease Neg Hx   . Colon polyps Neg Hx     Social History Social History   Tobacco Use  . Smoking status: Never Smoker  . Smokeless tobacco: Never Used  Substance Use Topics  . Alcohol use: No    Alcohol/week: 0.0 oz  . Drug use: No     Allergies   Aspirin; Penicillins; Amlodipine; Bactrim [sulfamethoxazole-trimethoprim]; Contrast media [iodinated diagnostic agents]; Iron; Nitrofurantoin; Tylenol [acetaminophen]; Gabapentin; Dexilant [dexlansoprazole]; Levaquin [levofloxacin in d5w]; Morphine and related; Plavix [clopidogrel bisulfate]; Protonix [pantoprazole sodium]; and Venofer [ferric oxide]   Review of Systems Review of Systems  Constitutional: Negative for appetite change and fatigue.  HENT: Negative for congestion, ear discharge and sinus pressure.   Eyes: Negative for discharge.  Respiratory: Negative for cough.   Cardiovascular: Positive for chest pain.  Gastrointestinal: Negative for abdominal pain and diarrhea.  Genitourinary: Negative for frequency and hematuria.  Musculoskeletal: Negative for back pain.  Skin: Negative for rash.  Neurological: Negative for seizures and headaches.  Psychiatric/Behavioral: Negative for hallucinations.     Physical Exam  Updated Vital Signs BP (!) 194/61   Pulse (!) 50   Temp 98.4 F (36.9 C) (Oral)   Resp 18   Ht 5\' 1"  (1.549 m)   Wt 54.9 kg (121 lb)   SpO2 99%   BMI 22.86 kg/m   Physical Exam  Constitutional: She is oriented to person, place, and time. She appears well-developed.  HENT:  Head: Normocephalic.  Eyes: Conjunctivae and EOM are normal. No scleral icterus.  Neck: Neck supple. No thyromegaly present.  Cardiovascular: Normal rate and  regular rhythm. Exam reveals no gallop and no friction rub.  No murmur heard. Pulmonary/Chest: No stridor. She has no wheezes. She has no rales. She exhibits no tenderness.  Abdominal: She exhibits no distension. There is no tenderness. There is no rebound.  Musculoskeletal: Normal range of motion. She exhibits no edema.  Lymphadenopathy:    She has no cervical adenopathy.  Neurological: She is oriented to person, place, and time. She exhibits normal muscle tone. Coordination normal.  Skin: No rash noted. No erythema.  Psychiatric: She has a normal mood and affect. Her behavior is normal.     ED Treatments / Results  Labs (all labs ordered are listed, but only abnormal results are displayed) Labs Reviewed  BASIC METABOLIC PANEL - Abnormal; Notable for the following components:      Result Value   Chloride 97 (*)    Glucose, Bld 104 (*)    BUN 48 (*)    Creatinine, Ser 6.88 (*)    Calcium 8.7 (*)    GFR calc non Af Amer 5 (*)    GFR calc Af Amer 6 (*)    All other components within normal limits  CBC - Abnormal; Notable for the following components:   RBC 3.49 (*)    Hemoglobin 11.4 (*)    MCV 106.0 (*)    RDW 15.6 (*)    Platelets 146 (*)    All other components within normal limits  TROPONIN I - Abnormal; Notable for the following components:   Troponin I 0.03 (*)    All other components within normal limits  TROPONIN I    EKG EKG Interpretation  Date/Time:  Tuesday February 13 2018 11:55:18 EDT Ventricular Rate:  51 PR Interval:  194 QRS Duration: 108 QT Interval:  502 QTC Calculation: 462 R Axis:   101 Text Interpretation:  Sinus bradycardia Rightward axis Incomplete left bundle branch block Borderline ECG Confirmed by Milton Ferguson [phone]) on 02/13/2018 1:22:12 PM Also confirmed by Milton Ferguson [phone])  on 02/13/2018 3:22:58 PM   Radiology Dg Chest 2 View  Result Date: 02/13/2018 CLINICAL DATA:  Chest pain. EXAM: CHEST - 2 VIEW COMPARISON:  Chest x-rays dated  12/19/2017 and 10/15/2017 FINDINGS: Chronic cardiomegaly.  CABG.  Coronary artery stents in place. New atelectasis at the left lung base. Scarring at the right lung base laterally. Chronic peribronchial thickening. No discrete effusions. No acute bone abnormality. Aortic atherosclerosis. IMPRESSION: 1. New slight linear atelectasis at the left lung base. 2. Chronic cardiomegaly. 3. Chronic scarring at the right lung base. 4. Chronic peribronchial thickening. Electronically Signed   By: Lorriane Shire M.D.   On: 02/13/2018 12:32    Procedures Procedures (including critical care time)  Medications Ordered in ED Medications  nitroGLYCERIN (NITROGLYN) 2 % ointment 0.5 inch (has no administration in time range)     Initial Impression / Assessment and Plan / ED Course  I have reviewed the triage vital signs and the nursing notes.  Pertinent labs & imaging results that were available during my care of the patient were reviewed by me and considered in my medical decision making (see chart for details).     Labs unremarkable except for troponin 0.03.  EKG shows no acute changes.  Patient was seen by cardiology who recommends admission to any [facility] hospital with cycling enzymes and EKGs and getting an echo  Final Clinical Impressions(s) / ED Diagnoses   Final diagnoses:  Unstable angina pectoris [facility])    ED Discharge Orders    None       Milton Ferguson, MD 02/13/18 1528

## 2018-02-13 NOTE — Consult Note (Signed)
Cardiology Consultation:   Patient ID: Shawna [name]; [id number]; 03-Jul-1941   Admit date: 02/13/2018 Date of Consult: 02/13/2018  Primary Care Provider: [name], MD Primary Cardiologist: Kate Sable, MD  Primary Electrophysiologist:  na   Patient Profile:   Shawna [name] is a 77 y.o. female with a hx of CAD who is being seen today for the evaluation of chest pain at the request of Dr Roderic Palau.  History of Present Illness:   Shawna [name] 77 yo female history of CAD with multiple stents in th past, most recently PCI to [place] 10/2016, ESRD, PSVT on amio, presents with chest pain. Reports episode last night after getting into bed. 8/10 sharp pain mid chest. Took NG x 1, within 10-15 minutes symptoms subsided and fell asleep. Woke this morning, episode of diffuse chest tightness 6-7/10, mild SOB. Took NGX2 without relief. Tightness has been constant since 945AM (5 hours at time of interview). Pain not positional, different from prior angina.    K 4.8 Cr 6.88 WBC 5.5 Hgb 11.4 Plt 146 Trop 0.03--> CXR no acute process EKG SR, lateral and anteroseptal Qwaves, chronic lateral ST/T changes 10/2017 echo LVEF 60-65%, grade II diastoilc dysfunction.    Past Medical History:  Diagnosis Date  . Acute on chronic respiratory failure with hypoxia ([place]) 10/10/2016  . Anxiety   . Arthritis   . AVM (arteriovenous malformation) of colon   . CAD (coronary artery disease)    a. 12/2011 NSTEMI/Cath/PCI LCX (2.25x14 Resolute DES) & D1 (2.25x22 Resolute DES);  b. 01/2012 Cath/PCI: LM 30, LAD 30p, 40-8m, D1 stent ok, 99 in sm Karen Kinnard of diag, LCX patent stent, OM1 20, RCA 95 ost (4.0x12 Promus DES), EF 55%;  c. 04/2012 Lexi Cardiolite  EF 48%, small area of scar @ base/mid inflat wall with mild peri-infarct ischemia.; CABG 12/4  . Carotid artery disease ([place])    a. 77-93% LICA, 03/299   . Chronic bronchitis ([place])   . Chronic diastolic CHF (congestive heart failure) ([place])    a. 02/2012 Echo EF 60-65%, nl wall  motion, Gr 1 DD, mod MR  . Colon cancer ([place]) 1992  . Esophageal stricture   . ESRD on hemodialysis ([place])    ESRD due to HTN, started dialysis 2011 and gets HD at [facility] with Dr Hinda Lenis on MWF schedule.  Access is LUA AVF as of Sept 2014.   Marland Kitchen GERD (gastroesophageal reflux disease)   . High cholesterol 12/2011  . History of blood transfusion 07/2011; 12/2011; 01/2012 X 2; 04/2012  . History of gout   . History of lower GI bleeding   . Hypertension   . Iron deficiency anemia   . Mitral regurgitation    a. Moderate by echo, 02/2012  . Myocardial infarction ([place])   . Ovarian cancer ([place]) 1992  . Pneumonia ~ 2009  . PUD (peptic ulcer disease)   . TIA (transient ischemic attack)     Past Surgical History:  Procedure Laterality Date  . ABDOMINAL HYSTERECTOMY  1992  . APPENDECTOMY  06/1990  . AV FISTULA PLACEMENT  07/2009   left upper arm  . AV FISTULA PLACEMENT Right 09/06/2016   Procedure: RIGHT FOREARM ARTERIOVENOUS (AV) GRAFT;  Surgeon: Elam Dutch, MD;  Location: [facility] OR;  Service: Vascular;  Laterality: Right;  . AV FISTULA PLACEMENT N/A 02/24/2017   Procedure: INSERTION OF ARTERIOVENOUS (AV) GORE-TEX GRAFT ARM (BRACHIAL AXILLARY);  Surgeon: Katha Cabal, MD;  Location: [facility] ORS;  Service: Vascular;  Laterality: N/A;  .  [place] REMOVAL Right 09/06/2016   Procedure: REMOVAL OF Right Arm ARTERIOVENOUS GORETEX GRAFT and Vein Patch angioplasty of brachial artery;  Surgeon: Angelia Mould, MD;  Location: [place];  Service: Vascular;  Laterality: Right;  . COLON RESECTION  1992  . COLON SURGERY    . CORONARY ANGIOPLASTY WITH STENT PLACEMENT  12/15/11   "2"  . CORONARY ANGIOPLASTY WITH STENT PLACEMENT  y/2013   "1; makes total of 3" (05/02/2012)  . CORONARY ARTERY BYPASS GRAFT  06/13/2012   Procedure: CORONARY ARTERY BYPASS GRAFTING (CABG);  Surgeon: Grace Isaac, MD;  Location: [place];  Service: Open Heart Surgery;  Laterality: N/A;  cabg x four;  using left internal mammary  artery, and left leg greater saphenous vein harvested endoscopically  . CORONARY STENT INTERVENTION N/A 10/13/2016   Procedure: Coronary Stent Intervention;  Surgeon: Troy Sine, MD;  Location: [place] CV LAB;  Service: Cardiovascular;  Laterality: N/A;  . DIALYSIS/PERMA CATHETER REMOVAL N/A 04/18/2017   Procedure: DIALYSIS/PERMA CATHETER REMOVAL;  Surgeon: Katha Cabal, MD;  Location: [place] CV LAB;  Service: Cardiovascular;  Laterality: N/A;  . DILATION AND CURETTAGE OF UTERUS    . ESOPHAGOGASTRODUODENOSCOPY  01/20/2012   Procedure: ESOPHAGOGASTRODUODENOSCOPY (EGD);  Surgeon: Ladene Artist, MD,FACG;  Location: [facility] ENDOSCOPY;  Service: Endoscopy;  Laterality: N/A;  . ESOPHAGOGASTRODUODENOSCOPY N/A 03/26/2013   Procedure: ESOPHAGOGASTRODUODENOSCOPY (EGD);  Surgeon: Irene Shipper, MD;  Location: [facility] ENDOSCOPY;  Service: Endoscopy;  Laterality: N/A;  . ESOPHAGOGASTRODUODENOSCOPY N/A 04/30/2015   Procedure: ESOPHAGOGASTRODUODENOSCOPY (EGD);  Surgeon: Rogene Houston, MD;  Location: [facility] ENDO SUITE;  Service: Endoscopy;  Laterality: N/A;  1pm - moved to 10/20 @ 1:10  . ESOPHAGOGASTRODUODENOSCOPY N/A 07/29/2016   Procedure: ESOPHAGOGASTRODUODENOSCOPY (EGD);  Surgeon: Manus Gunning, MD;  Location: [place];  Service: Gastroenterology;  Laterality: N/A;  enteroscopy  . INTRAOPERATIVE TRANSESOPHAGEAL ECHOCARDIOGRAM  06/13/2012   Procedure: INTRAOPERATIVE TRANSESOPHAGEAL ECHOCARDIOGRAM;  Surgeon: Grace Isaac, MD;  Location: [place];  Service: Open Heart Surgery;  Laterality: N/A;  . IR GENERIC HISTORICAL  07/26/2016   IR FLUORO GUIDE CV LINE RIGHT 07/26/2016 Greggory Keen, MD [facility]-INTER[facility] RAD  . IR GENERIC HISTORICAL  07/26/2016   IR US GUIDE VASC ACCESS RIGHT 07/26/2016 Greggory Keen, MD [facility]  . IR GENERIC HISTORICAL  08/02/2016   IR US GUIDE VASC ACCESS RIGHT 08/02/2016 Greggory Keen, MD [facility]  . IR GENERIC HISTORICAL  08/02/2016   IR FLUORO GUIDE CV LINE RIGHT  08/02/2016 Greggory Keen, MD [facility]  . IR RADIOLOGY PERIPHERAL GUIDED IV START  03/28/2017  . IR US GUIDE VASC ACCESS RIGHT  03/28/2017  . LEFT HEART CATH AND CORONARY ANGIOGRAPHY N/A 09/20/2016   Procedure: Left Heart Cath and Coronary Angiography;  Surgeon: [name], MD;  Location: [place] CV LAB;  Service: Cardiovascular;  Laterality: N/A;  . LEFT HEART CATH AND CORS/GRAFTS ANGIOGRAPHY N/A 10/13/2016   Procedure: Left Heart Cath and Cors/Grafts Angiography;  Surgeon: Troy Sine, MD;  Location: [place] CV LAB;  Service: Cardiovascular;  Laterality: N/A;  . LEFT HEART CATHETERIZATION WITH CORONARY ANGIOGRAM N/A 12/15/2011   Procedure: LEFT HEART CATHETERIZATION WITH CORONARY ANGIOGRAM;  Surgeon: Burnell Blanks, MD;  Location: [facility] CATH LAB;  Service: Cardiovascular;  Laterality: N/A;  . LEFT HEART CATHETERIZATION WITH CORONARY ANGIOGRAM N/A 01/10/2012   Procedure: LEFT HEART CATHETERIZATION WITH CORONARY ANGIOGRAM;  Surgeon: Peter M Martinique, MD;  Location: [facility] CATH LAB;  Service: Cardiovascular;  Laterality: N/A;  .  LEFT HEART CATHETERIZATION WITH CORONARY ANGIOGRAM N/A 06/08/2012   Procedure: LEFT HEART CATHETERIZATION WITH CORONARY ANGIOGRAM;  Surgeon: Burnell Blanks, MD;  Location: [facility] CATH LAB;  Service: Cardiovascular;  Laterality: N/A;  . LEFT HEART CATHETERIZATION WITH CORONARY/GRAFT ANGIOGRAM N/A 12/10/2013   Procedure: LEFT HEART CATHETERIZATION WITH Beatrix Fetters;  Surgeon: [name], MD;  Location: [facility] CATH LAB;  Service: Cardiovascular;  Laterality: N/A;  . OVARY SURGERY     ovarian cancer  . REVISION OF ARTERIOVENOUS GORETEX GRAFT N/A 02/24/2017   Procedure: REVISION OF ARTERIOVENOUS GORETEX GRAFT (RESECTION);  Surgeon: Katha Cabal, MD;  Location: [facility] ORS;  Service: Vascular;  Laterality: N/A;  [place] N/A 10/15/2013   Procedure: Fistulogram;  Surgeon: Serafina Mitchell, MD;  Location: [facility] CATH LAB;  Service: Cardiovascular;   Laterality: N/A;  . THROMBECTOMY / ARTERIOVENOUS GRAFT REVISION  2011   left upper arm  . TUBAL LIGATION  1980's  . UPPER EXTREMITY ANGIOGRAPHY Bilateral 12/06/2016   Procedure: Upper Extremity Angiography;  Surgeon: Katha Cabal, MD;  Location: [facility] CV LAB;  Service: Cardiovascular;  Laterality: Bilateral;  . UPPER EXTREMITY INTERVENTION Left 06/06/2017   Procedure: UPPER EXTREMITY INTERVENTION;  Surgeon: Katha Cabal, MD;  Location: [place] CV LAB;  Service: Cardiovascular;  Laterality: Left;    Inpatient Medications: Scheduled Meds:  Continuous Infusions:  PRN Meds:   Allergies:    Allergies  Allergen Reactions  . Aspirin Other (See Comments)    High Doses Mess up her stomach; "makes my bowels have blood in them". Takes 81 mg EC Aspirin   . Penicillins Other (See Comments)    SYNCOPE? , "makes me real weak when I take it; like I'll pass out"  Has patient had a PCN reaction causing immediate rash, facial/tongue/throat swelling, SOB or lightheadedness with hypotension: Yes Has patient had a PCN reaction causing severe rash involving mucus membranes or skin necrosis: no Has patient had a PCN reaction that required hospitalization no Has patient had a PCN reaction occurring within the last 10 years: no If all of the above  . Amlodipine Swelling  . Bactrim [Sulfamethoxazole-Trimethoprim] Rash  . Contrast Media [Iodinated Diagnostic Agents] Itching  . Iron Itching and Other (See Comments)    "they gave me iron in dialysis; had to give me Benadryl cause I had to have the iron" (05/02/2012)  . Nitrofurantoin Hives  . Tylenol [Acetaminophen] Itching and Other (See Comments)    Makes her feet on fire per pt  . Gabapentin Other (See Comments)    Unknown reaction  . Dexilant [Dexlansoprazole] Other (See Comments)    Upset stomach  . Levaquin [Levofloxacin In D5w] Rash  . Morphine And Related Itching and Other (See Comments)    Itching in feet  . Plavix  [Clopidogrel Bisulfate] Rash  . Protonix [Pantoprazole Sodium] Rash  . Venofer [Ferric Oxide] Itching and Other (See Comments)    Patient reports using Benadryl prior to doses as [place] History:   Social History   Socioeconomic History  . Marital status: Married    Spouse name: Not on file  . Number of children: Not on file  . Years of education: Not on file  . Highest education level: Not on file  Occupational History  . Not on file  Social Needs  . Financial resource strain: Not on file  . Food insecurity:    Worry: Not on file    Inability: Not on file  .  Transportation needs:    Medical: Not on file    Non-medical: Not on file  Tobacco Use  . Smoking status: Never Smoker  . Smokeless tobacco: Never Used  Substance and Sexual Activity  . Alcohol use: No    Alcohol/week: 0.0 oz  . Drug use: No  . Sexual activity: Yes    Birth control/protection: Surgical  Lifestyle  . Physical activity:    Days per week: Not on file    Minutes per session: Not on file  . Stress: Not on file  Relationships  . Social connections:    Talks on phone: Not on file    Gets together: Not on file    Attends religious service: Not on file    Active member of club or organization: Not on file    Attends meetings of clubs or organizations: Not on file    Relationship status: Not on file  . Intimate partner violence:    Fear of current or ex partner: Not on file    Emotionally abused: Not on file    Physically abused: Not on file    Forced sexual activity: Not on file  Other Topics Concern  . Not on file  Social History Narrative   Lives in [place], [place] with husband.  Dialysis pt - [facility].    Family History:    Family History  Problem Relation Age of Onset  . Heart disease Mother        Heart Disease before age 38  . Hyperlipidemia Mother   . Hypertension Mother   . Diabetes Mother   . Heart attack Mother   . Heart disease Father        Heart Disease before age 49    . Hyperlipidemia Father   . Hypertension Father   . Diabetes Father   . Diabetes Sister   . Hypertension Sister   . Diabetes Brother   . Hyperlipidemia Brother   . Heart attack Brother   . Hypertension Sister   . Heart attack Brother   . Other Unknown        noncontributory for early CAD  . Colon cancer Neg Hx   . Esophageal cancer Neg Hx   . Liver disease Neg Hx   . Kidney disease Neg Hx   . Colon polyps Neg Hx      ROS:  Please see the history of present illness.   All other ROS reviewed and negative.     Physical Exam/Data:   Vitals:   02/13/18 1157 02/13/18 1158 02/13/18 1313  BP: (!) 182/57  (!) 185/57  Pulse: (!) 53  (!) 52  Resp: 18  14  Temp: 98.4 F (36.9 C)    TempSrc: Oral    SpO2: 97%  100%  Weight:  121 lb (54.9 kg)   Height:  5\' 1"  (1.549 m)    No intake or output data in the 24 hours ending 02/13/18 1423 Filed Weights   02/13/18 1158  Weight: 121 lb (54.9 kg)   Body mass index is 22.86 kg/m.  General:  Well nourished, well developed, in no acute distress HEENT: normal Lymph: no adenopathy Neck: no JVD Endocrine:  No thryomegaly Cardiac:  normal S1, S2; RRR; no murmur Lungs:  clear to auscultation bilaterally, no wheezing, rhonchi or rales  Abd: soft, nontender, no hepatomegaly  Ext: no edema Musculoskeletal:  No deformities, BUE and BLE strength normal and equal Skin: warm and dry  Neuro:  CNs 2-12 intact, no focal  abnormalities noted Psych:  Normal affect     Laboratory Data:  Chemistry Recent Labs  Lab 02/13/18 1208  NA 135  K 4.8  CL 97*  CO2 26  GLUCOSE 104*  BUN 48*  CREATININE 6.88*  CALCIUM 8.7*  GFRNONAA 5*  GFRAA 6*  ANIONGAP 12    No results for input(s): PROT, ALBUMIN, AST, ALT, ALKPHOS, BILITOT in the last 168 hours. Hematology Recent Labs  Lab 02/13/18 1208  WBC 5.5  RBC 3.49*  HGB 11.4*  HCT 37.0  MCV 106.0*  MCH 32.7  MCHC 30.8  RDW 15.6*  PLT 146*   Cardiac Enzymes Recent Labs  Lab  02/13/18 1208  TROPONINI 0.03*   No results for input(s): TROPIPOC in the last 168 hours.  BNPNo results for input(s): BNP, PROBNP in the last 168 hours.  DDimer No results for input(s): DDIMER in the last 168 hours.  Radiology/Studies:  Dg Chest 2 View  Result Date: 02/13/2018 CLINICAL DATA:  Chest pain. EXAM: CHEST - 2 VIEW COMPARISON:  Chest x-rays dated 12/19/2017 and 10/15/2017 FINDINGS: Chronic cardiomegaly.  CABG.  Coronary artery stents in place. New atelectasis at the left lung base. Scarring at the right lung base laterally. Chronic peribronchial thickening. No discrete effusions. No acute bone abnormality. Aortic atherosclerosis. IMPRESSION: 1. New slight linear atelectasis at the left lung base. 2. Chronic cardiomegaly. 3. Chronic scarring at the right lung base. 4. Chronic peribronchial thickening. Electronically Signed   By: Lorriane Shire M.D.   On: 02/13/2018 12:32    Assessment and Plan:   1. CAD/Chest pain - somewhat atypical symptoms, tightness constant now at 5 hours. No specific acute ischic changes. Trop of 0.03 in ESRD patient is nonspecific finding - ok for monitoring at [facility]. Cycle EKG and enzymes, obtain echo. - keep npo tonight incase ischemic testing is indicated.   2. HTN - follow on home regimen, room to titrate hydralazine as needed.     For questions or updates, please contact [place] Please consult [email or web address] for contact info under Cardiology/STEMI.   Merrily Pew, MD  02/13/2018 2:23 PM

## 2018-02-13 NOTE — Telephone Encounter (Signed)
Patient contacted office stating BP is 183/60 HR 57. Patient states she is now starting to have tightness in her chest, shortness of breath and sweating. Patient took nitroglycerin. Patient will go to the ER at [facility].

## 2018-02-13 NOTE — ED Triage Notes (Signed)
Patient complaining of left sided chest tightness with diaphoresis starting this morning. States she took two nitro that were effective.

## 2018-02-13 NOTE — Telephone Encounter (Signed)
Patient called stating her BP continues to stay high. Please call [phone].

## 2018-02-13 NOTE — ED Notes (Signed)
CRITICAL VALUE ALERT  Critical Value:  Troponin - 0.03  Date & Time Notied:  02/13/18    1308  Provider Notified: Dr Thurnell Garbe  Orders Received/Actions taken: Patient taken to room 16 from waiting room.

## 2018-02-13 NOTE — H&P (Signed)
History and Physical    Shawna [name] ZWC:[id number] DOB: Feb 13, 1941 DOA: 02/13/2018  PCP: [name], MD  Patient coming from: Home  Chief Complaint: Chest pain  HPI: Shawna [name] is a 77 y.o. female with medical history significant of end-stage renal disease on dialysis Monday Wednesday Friday status post CABG, hypertension comes in with sudden onset of substernal chest pain early this morning without any radiation that lasted over an hour.  Patient took to her nitroglycerin at home and improved her symptoms.  She is currently chest pain-free after being placed on nitroglycerin paste.  She denies any fevers or cough.  She denies any lower extremity swelling or edema.  She denies any shortness of breath at this time.  Patient referred for admission for further cardiac evaluation.  Review of Systems: As per HPI otherwise 10 point review of systems negative.   Past Medical History:  Diagnosis Date  . Acute on chronic respiratory failure with hypoxia ([place]) 10/10/2016  . Anxiety   . Arthritis   . AVM (arteriovenous malformation) of colon   . CAD (coronary artery disease)    a. 12/2011 NSTEMI/Cath/PCI LCX (2.25x14 Resolute DES) & D1 (2.25x22 Resolute DES);  b. 01/2012 Cath/PCI: LM 30, LAD 30p, 40-34m, D1 stent ok, 99 in sm branch of diag, LCX patent stent, OM1 20, RCA 95 ost (4.0x12 Promus DES), EF 55%;  c. 04/2012 Lexi Cardiolite  EF 48%, small area of scar @ base/mid inflat wall with mild peri-infarct ischemia.; CABG 12/4  . Carotid artery disease ([place])    a. 23-53% LICA, 12/1441   . Chronic bronchitis ([place])   . Chronic diastolic CHF (congestive heart failure) ([place])    a. 02/2012 Echo EF 60-65%, nl wall motion, Gr 1 DD, mod MR  . Colon cancer ([place]) 1992  . Esophageal stricture   . ESRD on hemodialysis ([place])    ESRD due to HTN, started dialysis 2011 and gets HD at [facility] with Dr Hinda Lenis on MWF schedule.  Access is LUA AVF as of Sept 2014.   Marland Kitchen GERD (gastroesophageal reflux disease)   .  High cholesterol 12/2011  . History of blood transfusion 07/2011; 12/2011; 01/2012 X 2; 04/2012  . History of gout   . History of lower GI bleeding   . Hypertension   . Iron deficiency anemia   . Mitral regurgitation    a. Moderate by echo, 02/2012  . Myocardial infarction ([place])   . Ovarian cancer ([place]) 1992  . Pneumonia ~ 2009  . PUD (peptic ulcer disease)   . TIA (transient ischemic attack)     Past Surgical History:  Procedure Laterality Date  . ABDOMINAL HYSTERECTOMY  1992  . APPENDECTOMY  06/1990  . AV FISTULA PLACEMENT  07/2009   left upper arm  . AV FISTULA PLACEMENT Right 09/06/2016   Procedure: RIGHT FOREARM ARTERIOVENOUS (AV) GRAFT;  Surgeon: Elam Dutch, MD;  Location: [facility] OR;  Service: Vascular;  Laterality: Right;  . AV FISTULA PLACEMENT N/A 02/24/2017   Procedure: INSERTION OF ARTERIOVENOUS (AV) GORE-TEX GRAFT ARM (BRACHIAL AXILLARY);  Surgeon: Katha Cabal, MD;  Location: [facility] ORS;  Service: Vascular;  Laterality: N/A;  . [place] Right 09/06/2016   Procedure: REMOVAL OF Right Arm ARTERIOVENOUS GORETEX GRAFT and Vein Patch angioplasty of brachial artery;  Surgeon: Angelia Mould, MD;  Location: [place];  Service: Vascular;  Laterality: Right;  . COLON RESECTION  1992  . COLON SURGERY    . CORONARY ANGIOPLASTY WITH STENT  PLACEMENT  12/15/11   "2"  . CORONARY ANGIOPLASTY WITH STENT PLACEMENT  y/2013   "1; makes total of 3" (05/02/2012)  . CORONARY ARTERY BYPASS GRAFT  06/13/2012   Procedure: CORONARY ARTERY BYPASS GRAFTING (CABG);  Surgeon: Grace Isaac, MD;  Location: [place];  Service: Open Heart Surgery;  Laterality: N/A;  cabg x four;  using left internal mammary artery, and left leg greater saphenous vein harvested endoscopically  . CORONARY STENT INTERVENTION N/A 10/13/2016   Procedure: Coronary Stent Intervention;  Surgeon: Troy Sine, MD;  Location: [place] CV LAB;  Service: Cardiovascular;  Laterality: N/A;  . DIALYSIS/PERMA CATHETER REMOVAL  N/A 04/18/2017   Procedure: DIALYSIS/PERMA CATHETER REMOVAL;  Surgeon: Katha Cabal, MD;  Location: [place] CV LAB;  Service: Cardiovascular;  Laterality: N/A;  . DILATION AND CURETTAGE OF UTERUS    . ESOPHAGOGASTRODUODENOSCOPY  01/20/2012   Procedure: ESOPHAGOGASTRODUODENOSCOPY (EGD);  Surgeon: Ladene Artist, MD,FACG;  Location: [facility] ENDOSCOPY;  Service: Endoscopy;  Laterality: N/A;  . ESOPHAGOGASTRODUODENOSCOPY N/A 03/26/2013   Procedure: ESOPHAGOGASTRODUODENOSCOPY (EGD);  Surgeon: Irene Shipper, MD;  Location: [facility] ENDOSCOPY;  Service: Endoscopy;  Laterality: N/A;  . ESOPHAGOGASTRODUODENOSCOPY N/A 04/30/2015   Procedure: ESOPHAGOGASTRODUODENOSCOPY (EGD);  Surgeon: Rogene Houston, MD;  [facility];  Service: Endoscopy;  Laterality: N/A;  1pm - moved to 10/20 @ 1:10  . ESOPHAGOGASTRODUODENOSCOPY N/A 07/29/2016   Procedure: ESOPHAGOGASTRODUODENOSCOPY (EGD);  Surgeon: Manus Gunning, MD;  Location: [place];  Service: Gastroenterology;  Laterality: N/A;  enteroscopy  . INTRAOPERATIVE TRANSESOPHAGEAL ECHOCARDIOGRAM  06/13/2012   Procedure: INTRAOPERATIVE TRANSESOPHAGEAL ECHOCARDIOGRAM;  Surgeon: Grace Isaac, MD;  Location: [place];  Service: Open Heart Surgery;  Laterality: N/A;  . IR GENERIC HISTORICAL  07/26/2016   IR FLUORO GUIDE CV LINE RIGHT 07/26/2016 Greggory Keen, MD [facility]  . IR GENERIC HISTORICAL  07/26/2016   IR US GUIDE VASC ACCESS RIGHT 07/26/2016 Greggory Keen, MD [facility]  . IR GENERIC HISTORICAL  08/02/2016   IR US GUIDE VASC ACCESS RIGHT 08/02/2016 Greggory Keen, MD [facility]  . IR GENERIC HISTORICAL  08/02/2016   IR FLUORO GUIDE CV LINE RIGHT 08/02/2016 Greggory Keen, MD [facility]  . IR RADIOLOGY PERIPHERAL GUIDED IV START  03/28/2017  . IR US GUIDE VASC ACCESS RIGHT  03/28/2017  . LEFT HEART CATH AND CORONARY ANGIOGRAPHY N/A 09/20/2016   Procedure: Left Heart Cath and Coronary Angiography;  Surgeon: [name], MD;  Location: [place] CV LAB;  Service: Cardiovascular;  Laterality: N/A;  . LEFT HEART CATH AND CORS/GRAFTS ANGIOGRAPHY N/A 10/13/2016   Procedure: Left Heart Cath and Cors/Grafts Angiography;  Surgeon: Troy Sine, MD;  Location: [place] CV LAB;  Service: Cardiovascular;  Laterality: N/A;  . LEFT HEART CATHETERIZATION WITH CORONARY ANGIOGRAM N/A 12/15/2011   Procedure: LEFT HEART CATHETERIZATION WITH CORONARY ANGIOGRAM;  Surgeon: Burnell Blanks, MD;  Location: [facility] CATH LAB;  Service: Cardiovascular;  Laterality: N/A;  . LEFT HEART CATHETERIZATION WITH CORONARY ANGIOGRAM N/A 01/10/2012   Procedure: LEFT HEART CATHETERIZATION WITH CORONARY ANGIOGRAM;  Surgeon: Peter M Martinique, MD;  Location: [facility] CATH LAB;  Service: Cardiovascular;  Laterality: N/A;  . LEFT HEART CATHETERIZATION WITH CORONARY ANGIOGRAM N/A 06/08/2012   Procedure: LEFT HEART CATHETERIZATION WITH CORONARY ANGIOGRAM;  Surgeon: Burnell Blanks, MD;  Location: [facility] CATH LAB;  Service: Cardiovascular;  Laterality: N/A;  . LEFT HEART CATHETERIZATION WITH CORONARY/GRAFT ANGIOGRAM N/A 12/10/2013   Procedure: LEFT HEART CATHETERIZATION WITH CORONARY/GRAFT ANGIOGRAM;  Surgeon: [name], MD;  Location: [facility] CATH LAB;  Service: Cardiovascular;  Laterality: N/A;  . OVARY SURGERY     ovarian cancer  . REVISION OF ARTERIOVENOUS GORETEX GRAFT N/A 02/24/2017   Procedure: REVISION OF ARTERIOVENOUS GORETEX GRAFT (RESECTION);  Surgeon: Katha Cabal, MD;  Location: [facility] ORS;  Service: Vascular;  Laterality: N/A;  Earney Mal[place] N/A 10/15/2013   Procedure: Fistulogram;  Surgeon: Serafina Mitchell, MD;  Location: [facility] CATH LAB;  Service: Cardiovascular;  Laterality: N/A;  . THROMBECTOMY / ARTERIOVENOUS GRAFT REVISION  2011   left upper arm  . TUBAL LIGATION  1980's  . UPPER EXTREMITY ANGIOGRAPHY Bilateral 12/06/2016   Procedure: Upper Extremity Angiography;  Surgeon: Katha Cabal, MD;  Location: [place] CV LAB;  Service: Cardiovascular;   Laterality: Bilateral;  . UPPER EXTREMITY INTERVENTION Left 06/06/2017   Procedure: UPPER EXTREMITY INTERVENTION;  Surgeon: Katha Cabal, MD;  Location: [place] CV LAB;  Service: Cardiovascular;  Laterality: Left;     reports that she has never smoked. She has never used smokeless tobacco. She reports that she does not drink alcohol or use drugs.  Allergies  Allergen Reactions  . Aspirin Other (See Comments)    High Doses Mess up her stomach; "makes my bowels have blood in them". Takes 81 mg EC Aspirin   . Penicillins Other (See Comments)    SYNCOPE? , "makes me real weak when I take it; like I'll pass out"  Has patient had a PCN reaction causing immediate rash, facial/tongue/throat swelling, SOB or lightheadedness with hypotension: Yes Has patient had a PCN reaction causing severe rash involving mucus membranes or skin necrosis: no Has patient had a PCN reaction that required hospitalization no Has patient had a PCN reaction occurring within the last 10 years: no If all of the above  . Amlodipine Swelling  . Bactrim [Sulfamethoxazole-Trimethoprim] Rash  . Contrast Media [Iodinated Diagnostic Agents] Itching  . Iron Itching and Other (See Comments)    "they gave me iron in dialysis; had to give me Benadryl cause I had to have the iron" (05/02/2012)  . Nitrofurantoin Hives  . Tylenol [Acetaminophen] Itching and Other (See Comments)    Makes her feet on fire per pt  . Gabapentin Other (See Comments)    Unknown reaction  . Dexilant [Dexlansoprazole] Other (See Comments)    Upset stomach  . Levaquin [Levofloxacin In D5w] Rash  . Morphine And Related Itching and Other (See Comments)    Itching in feet  . Plavix [Clopidogrel Bisulfate] Rash  . Protonix [Pantoprazole Sodium] Rash  . Venofer [Ferric Oxide] Itching and Other (See Comments)    Patient reports using Benadryl prior to doses as [facility]    Family History  Problem Relation Age of Onset  . Heart disease  Mother        Heart Disease before age 53  . Hyperlipidemia Mother   . Hypertension Mother   . Diabetes Mother   . Heart attack Mother   . Heart disease Father        Heart Disease before age 69  . Hyperlipidemia Father   . Hypertension Father   . Diabetes Father   . Diabetes Sister   . Hypertension Sister   . Diabetes Brother   . Hyperlipidemia Brother   . Heart attack Brother   . Hypertension Sister   . Heart attack Brother   . Other Unknown        noncontributory for early CAD  .  Colon cancer Neg Hx   . Esophageal cancer Neg Hx   . Liver disease Neg Hx   . Kidney disease Neg Hx   . Colon polyps Neg Hx     Prior to Admission medications   Medication Sig Start Date End Date Taking? Authorizing Provider  ALPRAZolam (XANAX) 0.25 MG tablet Take 1 tablet (0.25 mg total) by mouth 2 (two) times daily as needed for anxiety or sleep. 04/05/17  Yes Arrien, Jimmy Picket, MD  amiodarone (PACERONE) 200 MG tablet Take 1 tablet (200 mg total) by mouth daily. 12/08/16  Yes Herminio Commons, MD  aspirin EC 81 MG tablet Take 81 mg by mouth once. FOR CHEST PAIN   Yes [provider]  bisacodyl (DULCOLAX) 10 MG suppository Place 1 suppository (10 mg total) rectally as needed for moderate constipation. 11/21/17  Yes Rehman, Mechele Dawley, MD  BRILINTA 60 MG TABS tablet TAKE 1 TABLET TWICE DAILY 02/06/18  Yes Herminio Commons, MD  carvedilol (COREG) 6.25 MG tablet TAKE 1 TABLET BY MOUTH TWICE DAILY WITH  A  MEAL Patient taking differently: TAKE 1 TABLET (6.25 MG) BY MOUTH TWICE DAILY WITH  A  MEAL 06/15/17  Yes Herminio Commons, MD  cinacalcet (SENSIPAR) 30 MG tablet Take 30 mg by mouth daily after supper.   Yes [provider]  cloNIDine (CATAPRES) 0.2 MG tablet Take 0.1-0.2 mg by mouth daily as needed (high blood pressure).    Yes [provider]  diphenhydrAMINE (BENADRYL) 25 mg capsule Take 50 mg by mouth daily as needed for itching or allergies.    Yes [provider]  diphenoxylate-atropine (LOMOTIL) 2.5-0.025 MG tablet Take 1 tablet by mouth 4 (four) times daily as needed for diarrhea or loose stools. 08/13/17  Yes Tanna Furry, MD  epoetin alfa (EPOGEN,PROCRIT) 62694 UNIT/ML injection Inject 1 mL (10,000 Units total) into the vein every Monday, Wednesday, and Friday with hemodialysis. 04/11/16  Yes Orvan Falconer, MD  escitalopram (LEXAPRO) 10 MG tablet  11/16/17  Yes [provider]  ferrous sulfate 325 (65 FE) MG EC tablet Take 1 tablet (325 mg total) by mouth 2 (two) times daily with a meal. Patient taking differently: Take 325 mg by mouth daily with breakfast.  07/15/17 02/13/18 Yes Johnson, Clanford L, MD  fluticasone (FLONASE) 50 MCG/ACT nasal spray Place 1 spray at bedtime as needed into both nostrils for allergies.    Yes [provider]  hydrALAZINE (APRESOLINE) 50 MG tablet Take 50 mg by mouth 2 (two) times daily as needed (high blood pressure).   Yes [provider]  isosorbide mononitrate (IMDUR) 120 MG 24 hr tablet TAKE 1 TABLET BY MOUTH ONCE DAILY 12/01/17  Yes Herminio Commons, MD  lanthanum (FOSRENOL) 1000 MG chewable tablet Chew by mouth. 04/29/11  Yes [provider]  lidocaine-prilocaine (EMLA) cream Apply 1 application topically every Monday, Wednesday, and Friday. Prior to dialysis   Yes [provider]  loratadine (CLARITIN) 10 MG tablet Take 10 mg by mouth daily as needed for allergies.   Yes [provider]  multivitamin (RENA-VIT) TABS tablet Take 1 tablet by mouth daily.   Yes [provider]  omeprazole (PRILOSEC) 20 MG capsule Take 1 capsule (20 mg total) by mouth daily. 11/21/17  Yes Rehman, Mechele Dawley, MD  ondansetron (ZOFRAN-ODT) 4 MG disintegrating tablet Take 4 mg by mouth every 8 (eight) hours as needed for nausea or vomiting.  07/12/17  Yes [provider]  sevelamer carbonate (RENVELA)  800 MG tablet Take 800-2,400 mg by mouth See admin instructions. Take 3  tablets (2400) by mouth twice daily with meals, take 1 tablet (800 mg) with snacks   Yes [provider]  simvastatin (ZOCOR) 20 MG tablet TAKE ONE TABLET BY MOUTH AT BEDTIME Patient taking differently: TAKE ONE TABLET (20 MG) BY MOUTH DAILY AT BEDTIME 02/13/17  Yes Herminio Commons, MD  guaiFENesin (ROBITUSSIN) 100 MG/5ML SOLN Take 5 mLs (100 mg total) by mouth every 6 (six) hours as needed for cough or to loosen phlegm. Patient not taking: Reported on 02/13/2018 10/16/17   Arrien, Jimmy Picket, MD  nitroGLYCERIN (NITROSTAT) 0.4 MG SL tablet Place 1 tablet (0.4 mg total) under the tongue every 5 (five) minutes as needed for chest pain. Patient not taking: Reported on 02/06/2018 06/05/17   Herminio Commons, MD  Wheat Dextrin (BENEFIBER DRINK MIX) PACK Take 4 g by mouth at bedtime. Patient not taking: Reported on 02/13/2018 11/21/17   Rogene Houston, MD    Physical Exam: Vitals:   02/13/18 1157 02/13/18 1158 02/13/18 1313 02/13/18 1330  BP: (!) 182/57  (!) 185/57 (!) 194/61  Pulse: (!) 53  (!) 52 (!) 50  Resp: 18  14 18   Temp: 98.4 F (36.9 C)     TempSrc: Oral     SpO2: 97%  100% 99%  Weight:  54.9 kg (121 lb)    Height:  5\' 1"  (1.549 m)        Constitutional: NAD, calm, comfortable Vitals:   02/13/18 1157 02/13/18 1158 02/13/18 1313 02/13/18 1330  BP: (!) 182/57  (!) 185/57 (!) 194/61  Pulse: (!) 53  (!) 52 (!) 50  Resp: 18  14 18   Temp: 98.4 F (36.9 C)     TempSrc: Oral     SpO2: 97%  100% 99%  Weight:  54.9 kg (121 lb)    Height:  5\' 1"  (1.549 m)     Eyes: PERRL, lids and conjunctivae normal ENMT: Mucous membranes are moist. Posterior pharynx clear of any exudate or lesions.Normal dentition.  Neck: normal, supple, no masses, no thyromegaly Respiratory: clear to auscultation bilaterally, no wheezing, no crackles. Normal respiratory effort. No accessory muscle use.  Cardiovascular: Regular rate and rhythm, no murmurs / rubs / gallops. No extremity edema. 2+  pedal pulses. No carotid bruits.  Abdomen: no tenderness, no masses palpated. No hepatosplenomegaly. Bowel sounds positive.  Musculoskeletal: no clubbing / cyanosis. No joint deformity upper and lower extremities. Good ROM, no contractures. Normal muscle tone.  Skin: no rashes, lesions, ulcers. No induration Neurologic: CN 2-12 grossly intact. Sensation intact, DTR normal. Strength 5/5 in all 4.  Psychiatric: Normal judgment and insight. Alert and oriented x 3. Normal mood.    Labs on Admission: I have personally reviewed following labs and imaging studies  CBC: Recent Labs  Lab 02/13/18 1208  WBC 5.5  HGB 11.4*  HCT 37.0  MCV 106.0*  PLT 412*   Basic Metabolic Panel: Recent Labs  Lab 02/13/18 1208  NA 135  K 4.8  CL 97*  CO2 26  GLUCOSE 104*  BUN 48*  CREATININE 6.88*  CALCIUM 8.7*   GFR: Estimated Creatinine Clearance: 5.2 mL/min (A) (by C-G formula based on SCr of 6.88 mg/dL (H)). Liver Function Tests: No results for input(s): AST, ALT, ALKPHOS, BILITOT, PROT, ALBUMIN in the last 168 hours. No results for input(s): LIPASE, AMYLASE in the last 168 hours. No results for input(s): AMMONIA in the last 168 hours.  Coagulation Profile: No results for input(s): INR, PROTIME in the last 168 hours. Cardiac Enzymes: Recent Labs  Lab 02/13/18 1208  TROPONINI 0.03*   BNP (last 3 results) No results for input(s): PROBNP in the last 8760 hours. HbA1C: No results for input(s): HGBA1C in the last 72 hours. CBG: No results for input(s): GLUCAP in the last 168 hours. Lipid Profile: No results for input(s): CHOL, HDL, LDLCALC, TRIG, CHOLHDL, LDLDIRECT in the last 72 hours. Thyroid Function Tests: No results for input(s): TSH, T4TOTAL, FREET4, T3FREE, THYROIDAB in the last 72 hours. Anemia Panel: No results for input(s): VITAMINB12, FOLATE, FERRITIN, TIBC, IRON, RETICCTPCT in the last 72 hours. Urine analysis:    Component Value Date/Time   COLORURINE YELLOW 12/16/2012  1919   APPEARANCEUR CLOUDY (A) 12/16/2012 1919   LABSPEC 1.009 12/16/2012 1919   PHURINE 7.5 12/16/2012 1919   GLUCOSEU NEGATIVE 12/16/2012 1919   HGBUR TRACE (A) 12/16/2012 1919   BILIRUBINUR NEGATIVE 12/16/2012 1919   KETONESUR NEGATIVE 12/16/2012 1919   PROTEINUR 100 (A) 12/16/2012 1919   UROBILINOGEN 0.2 12/16/2012 1919   NITRITE NEGATIVE 12/16/2012 1919   LEUKOCYTESUR SMALL (A) 12/16/2012 1919   Sepsis Labs: !!!!!!!!!!!!!!!!!!!!!!!!!!!!!!!!!!!!!!!!!!!! @LABRCNTIP (procalcitonin:4,lacticidven:4) )No results found for this or any previous visit (from the past 240 hour(s)).   Radiological Exams on Admission: Dg Chest 2 View  Result Date: 02/13/2018 CLINICAL DATA:  Chest pain. EXAM: CHEST - 2 VIEW COMPARISON:  Chest x-rays dated 12/19/2017 and 10/15/2017 FINDINGS: Chronic cardiomegaly.  CABG.  Coronary artery stents in place. New atelectasis at the left lung base. Scarring at the right lung base laterally. Chronic peribronchial thickening. No discrete effusions. No acute bone abnormality. Aortic atherosclerosis. IMPRESSION: 1. New slight linear atelectasis at the left lung base. 2. Chronic cardiomegaly. 3. Chronic scarring at the right lung base. 4. Chronic peribronchial thickening. Electronically Signed   By: Lorriane Shire M.D.   On: 02/13/2018 12:32    EKG: Independently reviewed.  Sinus rhythm incomplete left bundle branch block  Old chart reviewed  Chest x-ray reviewed no edema or infiltrate  Case discussed with Dr. Roderic Palau in the ED  Assessment/Plan 77 year old female with end-stage renal disease and coronary artery disease comes in with chest pain/unstable angina Principal Problem:   Chest pain- serial cardiac enzymes.  Placed nitroglycerin paste.  Cardiac echo in the morning.  Cardiology consulted for further evaluation and work-up.  Continue aspirin.  Currently chest pain-free.  Active Problems:   ESRD on hemodialysis (HCC)-consult nephrology for routine dialysis in the  morning if she still here.    Hx of CABG-noted    Chronic diastolic CHF (congestive heart failure) (HCC)-stable and compensated at this time    Essential hypertension-resume home meds.  Somewhat pending pharmacy clarification.    DVT prophylaxis: SCDs Code Status: Full Family Communication: None Disposition Plan: Likely tomorrow Consults called: Cardiology and nephrology Admission status: Observation   Bobette Leyh A MD [facility]  If 7PM-7AM, please contact night-coverage [email or web address] Password [facility]  02/13/2018, 3:43 PM

## 2018-02-14 ENCOUNTER — Other Ambulatory Visit: Payer: Self-pay

## 2018-02-14 ENCOUNTER — Observation Stay (HOSPITAL_BASED_OUTPATIENT_CLINIC_OR_DEPARTMENT_OTHER): Payer: Medicare HMO

## 2018-02-14 ENCOUNTER — Observation Stay (HOSPITAL_COMMUNITY): Payer: Medicare HMO

## 2018-02-14 ENCOUNTER — Encounter (HOSPITAL_COMMUNITY): Payer: Self-pay | Admitting: *Deleted

## 2018-02-14 DIAGNOSIS — R0789 Other chest pain: Secondary | ICD-10-CM | POA: Diagnosis not present

## 2018-02-14 DIAGNOSIS — N2581 Secondary hyperparathyroidism of renal origin: Secondary | ICD-10-CM | POA: Diagnosis not present

## 2018-02-14 DIAGNOSIS — Z992 Dependence on renal dialysis: Secondary | ICD-10-CM | POA: Diagnosis not present

## 2018-02-14 DIAGNOSIS — I361 Nonrheumatic tricuspid (valve) insufficiency: Secondary | ICD-10-CM

## 2018-02-14 DIAGNOSIS — R05 Cough: Secondary | ICD-10-CM | POA: Diagnosis not present

## 2018-02-14 DIAGNOSIS — N186 End stage renal disease: Secondary | ICD-10-CM | POA: Diagnosis not present

## 2018-02-14 DIAGNOSIS — Z951 Presence of aortocoronary bypass graft: Secondary | ICD-10-CM

## 2018-02-14 DIAGNOSIS — I5032 Chronic diastolic (congestive) heart failure: Secondary | ICD-10-CM | POA: Diagnosis not present

## 2018-02-14 DIAGNOSIS — D631 Anemia in chronic kidney disease: Secondary | ICD-10-CM | POA: Diagnosis not present

## 2018-02-14 DIAGNOSIS — I10 Essential (primary) hypertension: Secondary | ICD-10-CM | POA: Diagnosis not present

## 2018-02-14 DIAGNOSIS — R079 Chest pain, unspecified: Secondary | ICD-10-CM | POA: Diagnosis not present

## 2018-02-14 LAB — ECHOCARDIOGRAM COMPLETE
Height: 61 in
Weight: 1936 oz

## 2018-02-14 LAB — MRSA PCR SCREENING: MRSA BY PCR: NEGATIVE

## 2018-02-14 MED ORDER — SODIUM CHLORIDE 0.9 % IV SOLN: 100.0000 mL | INTRAVENOUS | Status: DC | PRN

## 2018-02-14 MED ORDER — HEPARIN SODIUM (PORCINE) 1000 UNIT/ML DIALYSIS
20.0000 [IU]/kg | INTRAMUSCULAR | Status: DC | PRN
  Administered 2018-02-14: 1100 [IU] via INTRAVENOUS_CENTRAL
  Filled 2018-02-14 (×2): qty 2

## 2018-02-14 MED ORDER — BARIUM SULFATE 2.1 % PO SUSP
ORAL | Status: AC
  Administered 2018-02-14: 07:00:00
  Filled 2018-02-14: qty 2

## 2018-02-14 MED ORDER — DM-GUAIFENESIN ER 30-600 MG PO TB12
1.0000 | ORAL_TABLET | Freq: Two times a day (BID) | ORAL | Status: DC
  Administered 2018-02-14 (×2): 1 via ORAL
  Filled 2018-02-14 (×3): qty 1

## 2018-02-14 MED ORDER — ESCITALOPRAM OXALATE 10 MG PO TABS
10.0000 mg | ORAL_TABLET | Freq: Every day | ORAL | Status: DC
  Administered 2018-02-14 – 2018-02-15 (×2): 10 mg via ORAL
  Filled 2018-02-14 (×2): qty 1

## 2018-02-14 MED ORDER — HYDRALAZINE HCL 20 MG/ML IJ SOLN
5.0000 mg | Freq: Once | INTRAMUSCULAR | Status: AC
  Administered 2018-02-14: 5 mg via INTRAVENOUS
  Filled 2018-02-14: qty 1

## 2018-02-14 MED ORDER — PENTAFLUOROPROP-TETRAFLUOROETH EX AERO: 1.0000 "application " | INHALATION_SPRAY | CUTANEOUS | Status: DC | PRN

## 2018-02-14 MED ORDER — HYDRALAZINE HCL 25 MG PO TABS
50.0000 mg | ORAL_TABLET | Freq: Three times a day (TID) | ORAL | Status: DC
  Administered 2018-02-14 – 2018-02-15 (×5): 50 mg via ORAL
  Filled 2018-02-14 (×5): qty 2

## 2018-02-14 MED ORDER — LANTHANUM CARBONATE 500 MG PO CHEW
1000.0000 mg | CHEWABLE_TABLET | Freq: Three times a day (TID) | ORAL | Status: DC
  Filled 2018-02-14 (×7): qty 2

## 2018-02-14 MED ORDER — BENZONATATE 100 MG PO CAPS
200.0000 mg | ORAL_CAPSULE | Freq: Three times a day (TID) | ORAL | Status: DC
  Administered 2018-02-14 – 2018-02-15 (×5): 200 mg via ORAL
  Filled 2018-02-14 (×5): qty 2

## 2018-02-14 MED ORDER — CARVEDILOL 3.125 MG PO TABS
6.2500 mg | ORAL_TABLET | Freq: Two times a day (BID) | ORAL | Status: DC
  Administered 2018-02-14 – 2018-02-15 (×4): 6.25 mg via ORAL
  Filled 2018-02-14 (×4): qty 2

## 2018-02-14 MED ORDER — LIDOCAINE-PRILOCAINE 2.5-2.5 % EX CREA: 1.0000 "application " | TOPICAL_CREAM | CUTANEOUS | Status: DC | PRN

## 2018-02-14 MED ORDER — LIDOCAINE HCL (PF) 1 % IJ SOLN: 5.0000 mL | INTRAMUSCULAR | Status: DC | PRN

## 2018-02-14 MED ORDER — FAMOTIDINE 20 MG PO TABS
20.0000 mg | ORAL_TABLET | Freq: Two times a day (BID) | ORAL | Status: DC
  Administered 2018-02-14 – 2018-02-15 (×3): 20 mg via ORAL
  Filled 2018-02-14 (×3): qty 1

## 2018-02-14 MED ORDER — HYDRALAZINE HCL 20 MG/ML IJ SOLN
10.0000 mg | Freq: Once | INTRAMUSCULAR | Status: AC
  Administered 2018-02-14: 10 mg via INTRAVENOUS
  Filled 2018-02-14: qty 1

## 2018-02-14 MED ORDER — CHLORHEXIDINE GLUCONATE CLOTH 2 % EX PADS: 6.0000 | MEDICATED_PAD | Freq: Every day | CUTANEOUS | Status: DC

## 2018-02-14 MED ORDER — HYDROCOD POLST-CPM POLST ER 10-8 MG/5ML PO SUER
5.0000 mL | Freq: Two times a day (BID) | ORAL | Status: DC
  Administered 2018-02-14 – 2018-02-15 (×3): 5 mL via ORAL
  Filled 2018-02-14 (×3): qty 5

## 2018-02-14 NOTE — Care Management Obs Status (Signed)
[place] NOTIFICATION   Patient Details  Name: Shawna [name] MRN: [id number] Date of Birth: 23-Jul-1940   Medicare Observation Status Notification Given:  Yes    Sherald Barge, RN 02/14/2018, 12:40 PM

## 2018-02-14 NOTE — Progress Notes (Signed)
  Echocardiogram 2D Echocardiogram has been performed.  Randa Lynn Adhira Jamil 02/14/2018, 12:32 PM

## 2018-02-14 NOTE — Progress Notes (Signed)
Progress Note  Patient Name: Shawna [name] Date of Encounter: 02/14/2018  Primary Cardiologist: Kate Sable, MD   Subjective    Abdominal pain last night now resolved. Cough and some SOB this AM. Chest pain has resolved.   Inpatient Medications    Scheduled Meds: . amiodarone  200 mg Oral Daily  . Barium Sulfate      . carvedilol  6.25 mg Oral BID WC  . chlorpheniramine-HYDROcodone  5 mL Oral Q12H  . cinacalcet  30 mg Oral Q supper  . dextromethorphan-guaiFENesin  1 tablet Oral BID  . escitalopram  10 mg Oral Daily  . famotidine  20 mg Oral BID  . hydrALAZINE  50 mg Oral Q8H  . isosorbide mononitrate  120 mg Oral Daily  . lanthanum  1,000 mg Oral TID WC  . lidocaine-prilocaine  1 application Topical Q M,W,F  . multivitamin  1 tablet Oral Daily  . psyllium  1 packet Oral QHS  . sevelamer carbonate  2,400 mg Oral BID WC  . sevelamer carbonate  800 mg Oral With snacks  . ticagrelor  60 mg Oral BID   Continuous Infusions:  PRN Meds: acetaminophen, ALPRAZolam, bisacodyl, diphenhydrAMINE, diphenoxylate-atropine, fluticasone, gi cocktail, loratadine, nitroGLYCERIN, ondansetron (ZOFRAN) IV, ondansetron   Vital Signs    Vitals:   02/14/18 0630 02/14/18 0700 02/14/18 0730 02/14/18 0814  BP: (!) 161/66 (!) 179/73 (!) 179/64 (!) 196/72  Pulse:    65  Resp: 13 13 17 18   Temp:    98.6 F (37 C)  TempSrc:    Oral  SpO2:    100%  Weight:      Height:       No intake or output data in the 24 hours ending 02/14/18 0937 Filed Weights   02/13/18 1158  Weight: 121 lb (54.9 kg)    Telemetry    NSR - Personally Reviewed  ECG    na  Physical Exam   GEN: No acute distress.   Neck: No JVD Cardiac: RRR, no murmurs, rubs, or gallops.  Respiratory: faint bilateral crackles GI: Soft, nontender, non-distended  MS: No edema; No deformity. Neuro:  Nonfocal  Psych: Normal affect   Labs    Chemistry Recent Labs  Lab 02/13/18 1208  NA 135  K 4.8  CL 97*  CO2  26  GLUCOSE 104*  BUN 48*  CREATININE 6.88*  CALCIUM 8.7*  GFRNONAA 5*  GFRAA 6*  ANIONGAP 12     Hematology Recent Labs  Lab 02/13/18 1208  WBC 5.5  RBC 3.49*  HGB 11.4*  HCT 37.0  MCV 106.0*  MCH 32.7  MCHC 30.8  RDW 15.6*  PLT 146*    Cardiac Enzymes Recent Labs  Lab 02/13/18 1519 02/13/18 1651 02/13/18 1934 02/13/18 2233  TROPONINI 0.04* 0.03* 0.04* 0.04*   No results for input(s): TROPIPOC in the last 168 hours.   BNPNo results for input(s): BNP, PROBNP in the last 168 hours.   DDimer No results for input(s): DDIMER in the last 168 hours.   Radiology    Dg Chest 2 View  Result Date: 02/13/2018 CLINICAL DATA:  Chest pain. EXAM: CHEST - 2 VIEW COMPARISON:  Chest x-rays dated 12/19/2017 and 10/15/2017 FINDINGS: Chronic cardiomegaly.  CABG.  Coronary artery stents in place. New atelectasis at the left lung base. Scarring at the right lung base laterally. Chronic peribronchial thickening. No discrete effusions. No acute bone abnormality. Aortic atherosclerosis. IMPRESSION: 1. New slight linear atelectasis at the left lung base.  2. Chronic cardiomegaly. 3. Chronic scarring at the right lung base. 4. Chronic peribronchial thickening. Electronically Signed   By: Lorriane Shire M.D.   On: 02/13/2018 12:32    Cardiac Studies    Patient Profile     Shawna [name] is a 77 y.o. female with a hx of CAD who is being seen today for the evaluation of chest pain at the request of Dr Roderic Palau.    Assessment & Plan   1. CAD/Chest pain - somewhat atypical symptoms, tightness constant now at 5 hours. No specific acute ischic changes. Trop of 0.03 in ESRD patient is nonspecific finding - Trops flat 0.03/0.04, not consistent with ACS. Chest pain has resolved this AM - f/u echo. With abdominal pain, cough, fatigue, and plans for dialysis this AM hold off on stress test, would likely complete tomorrow. Would make npo tonight.   2. HTN - elevated. Hydaral increased to 50mg  tid.  Follow bp's after HD, may titrate hydral further if needed.   3. Abdominal pain - issues over night with abdominal pain - management per primary team.     For questions or updates, please contact [place] Please consult [email or web address] for contact info under Cardiology/STEMI.      Merrily Pew, MD  02/14/2018, 9:37 AM

## 2018-02-14 NOTE — Procedures (Signed)
     HEMODIALYSIS TREATMENT NOTE:    3.5 hour low-heparin dialysis completed via left upper arm AVG (15g/antegrade). Goal met: 2L removed without interruption in ultrafiltration.  All blood was returned and hemostasis was achieved in 10 minutes. Report given to Vista Deck, RN.   Rockwell Alexandria, RN CDN

## 2018-02-14 NOTE — Progress Notes (Signed)
Night shift telemetry coverage note.  The patient was seen earlier after complaining of abdominal pain, nausea and abdominal distention.  She gets occasional abdominal pain, but this has been more intense than usual.  CT done in June show enlarging renal masses.  Symptoms treated with analgesics, antiemetics and H2 blocker.  CT abdomen/pelvis without contrast ordered.  Tennis Must, MD

## 2018-02-14 NOTE — Consult Note (Addendum)
Shawna [name] MRN: [id number] DOB/AGE: 1940/07/18 77 y.o. Primary Care Physician:Howard, Lennette Bihari, MD Admit date: 02/13/2018 Chief Complaint:  Chief Complaint  Patient presents with  . Chest Pain   HPI: Pt is 77 year old female withg past medical hx of ESRD who was presented to Er with c/o Chest pain  HPI dates back to last night when pt had sudden onset of chest pain. Pt also c/o abdominal pain,in middle and on right side Pt also c/o cough/" I coughed whole night, now I a,m better" NO c/o hematemeis No c/o syncope No c/o recent travel. NO c/o blood in stools No c/o hematuria NO c/o black tarry stools. Pt says " my belly was hurting, now better" -unable to qualify any further.    Past Medical History:  Diagnosis Date  . Acute on chronic respiratory failure with hypoxia ([place]) 10/10/2016  . Anxiety   . Arthritis   . AVM (arteriovenous malformation) of colon   . CAD (coronary artery disease)    a. 12/2011 NSTEMI/Cath/PCI LCX (2.25x14 Resolute DES) & D1 (2.25x22 Resolute DES);  b. 01/2012 Cath/PCI: LM 30, LAD 30p, 40-42m, D1 stent ok, 99 in sm branch of diag, LCX patent stent, OM1 20, RCA 95 ost (4.0x12 Promus DES), EF 55%;  c. 04/2012 Lexi Cardiolite  EF 48%, small area of scar @ base/mid inflat wall with mild peri-infarct ischemia.; CABG 12/4  . Carotid artery disease ([place])    a. 32-44% LICA, 0/1027   . Chronic bronchitis ([place])   . Chronic diastolic CHF (congestive heart failure) ([place])    a. 02/2012 Echo EF 60-65%, nl wall motion, Gr 1 DD, mod MR  . Colon cancer ([place]) 1992  . Esophageal stricture   . ESRD on hemodialysis ([place])    ESRD due to HTN, started dialysis 2011 and gets HD at [facility] with Dr Hinda Lenis on MWF schedule.  Access is LUA AVF as of Sept 2014.   Marland Kitchen GERD (gastroesophageal reflux disease)   . High cholesterol 12/2011  . History of blood transfusion 07/2011; 12/2011; 01/2012 X 2; 04/2012  . History of gout   . History of lower GI bleeding   . Hypertension   . Iron  deficiency anemia   . Mitral regurgitation    a. Moderate by echo, 02/2012  . Myocardial infarction ([place])   . Ovarian cancer ([place]) 1992  . Pneumonia ~ 2009  . PUD (peptic ulcer disease)   . TIA (transient ischemic attack)       Family hx Reviewed NO hx of ESRD    Social History: denies smoking/drinking/illegal drug usage Lives with her husband.   Allergies:  reviewed       OZD:GUYQI from the symptoms mentioned above,there are no other symptoms referable to all systems reviewed.  Marland Kitchen amiodarone  200 mg Oral Daily  . benzonatate  200 mg Oral TID  . carvedilol  6.25 mg Oral BID WC  . Chlorhexidine Gluconate Cloth  6 each Topical Q0600  . chlorpheniramine-HYDROcodone  5 mL Oral Q12H  . cinacalcet  30 mg Oral Q supper  . dextromethorphan-guaiFENesin  1 tablet Oral BID  . escitalopram  10 mg Oral Daily  . famotidine  20 mg Oral BID  . hydrALAZINE  50 mg Oral Q8H  . isosorbide mononitrate  120 mg Oral Daily  . lanthanum  1,000 mg Oral TID WC  . lidocaine-prilocaine  1 application Topical Q M,W,F  . multivitamin  1 tablet Oral Daily  . psyllium  1 packet  Oral QHS  . sevelamer carbonate  2,400 mg Oral BID WC  . sevelamer carbonate  800 mg Oral With snacks  . ticagrelor  60 mg Oral BID      Physical Exam: Vital signs in last 24 hours: Temp:  [98.1 F (36.7 C)-98.6 F (37 C)] 98.1 F (36.7 C) (08/07 1250) Pulse Rate:  [53-73] 70 (08/07 1500) Resp:  [13-23] 18 (08/07 1250) BP: (147-237)/(52-97) 169/70 (08/07 1500) SpO2:  [95 %-100 %] 99 % (08/07 1250) Weight:  [126 lb 15.8 oz (57.6 kg)] 126 lb 15.8 oz (57.6 kg) (08/07 1250) Weight change:  Last BM Date: 02/12/18  Intake/Output from previous day: No intake/output data recorded. Total I/O In: 340 [P.O.:340] Out: -    Physical Exam: General- pt is awake,alert, oriented to time place and person Resp- No acute REsp distress,no rhonchi CVS- S1S2 regular in rate and rhythm GIT- BS+, soft, non tender,  non-rigid EXT- NO LE Edema, NO Cyanosis CNS- CN 2-12 grossly intact. Moving all 4 extremities Psych- normal mood and affect Access-  Left AVF + bruit  Lab Results: CBC Recent Labs    02/13/18 1208  WBC 5.5  HGB 11.4*  HCT 37.0  PLT 146*    BMET Recent Labs    02/13/18 1208  NA 135  K 4.8  CL 97*  CO2 26  GLUCOSE 104*  BUN 48*  CREATININE 6.88*  CALCIUM 8.7*      Lab Results  Component Value Date   PTH 709.7 (H) 05/29/2013   CALCIUM 8.7 (L) 02/13/2018   CAION 1.06 (L) 07/26/2016   PHOS 4.1 10/16/2017         Impression: 1)Renal ESRd on HD Pt is  On Monday/Wednesday/Friday schedule as outpt Pt will  dialyzed be today  2)HTN  BP on higher side Fluid removal should help     3)Anemia in ESRD the goal for  HGb is  9--11. Pt HGb   at goal Hx of GI bleed Will not give  Epo  4)CKD Mineral-Bone Disorder Secondary Hyperparathyroidism present    On Cinacalcet Phosphorus not at goal.     On binders Calcium at Goal   5)GI-now with abdominal pain. Primary MD following  6)Electrolytes  normokalemic  normonatremic   7)Acid base Co2 at goal  8) CVS-admitted with chest pain Cardiology and Primary team following    Plan:  Will use 2 k/2.5 cal  bath Will give heparin  Will hold off on epo   Addendum Pt seen on HD Pt tolerating tx well.  [place] S 02/14/2018, 3:20 PM

## 2018-02-14 NOTE — Progress Notes (Signed)
PROGRESS NOTE    Shawna [name]   WNU:[id number]  DOB: Jul 02, 1941  DOA: 02/13/2018 PCP: [name], MD   Brief Narrative:  Shawna [name] is a 77 y/o female with ESRD, CAD s/p CABG, HTN who presents with pain in the center of her chest at rest, without radiation which improved with Nitro.  She was admitted for chest pain in April of this year as well and ruled out for an MI.   Subjective: Today she admits to a severe and persistent cough with white sputum. No fevers. She has pain in the left side of her chest which is worse when she coughs. She feels short of breath as well.     Assessment & Plan:   Principal Problem:   Chest pain - Troponin 0.03 and 0.04 - cardiology eval requested- plans for stress test tomorrow AM - ? If it is partly related to her cough - see below  Active Problems: Cough with congestion - ? Viral- start Tussionex, Guaifenesin DM and Tessalson- follow for fevers- no wheezing- hold off on inhalters  Severe HTN - have resumed all home meds and increased Hydroxyzine from 50 BID to TID -  follow BP  PAF - not on anticoagulation - cont Amiodarone and Coreg    ESRD on hemodialysis  - Nephrology eval requested- MWF dialysis    Hx of CABG - cont Brilinta    Chronic diastolic CHF (congestive heart failure)    - fluid management with dialysis   DVT prophylaxis: SCDs Code Status: Full code Family Communication:  Disposition Plan: stress test tomorrow Consultants:   Cardiology  Nephrology  Procedures:   2D ECHO Study Conclusions  - Left ventricle: The cavity size was normal. Wall thickness was   increased in a pattern of mild LVH. Systolic function was normal.   The estimated ejection fraction was in the range of 55% to 60%.   Wall motion was normal; there were no regional wall motion   abnormalities. Features are consistent with a pseudonormal left   ventricular filling pattern, with concomitant abnormal relaxation   and increased filling  pressure (grade 2 diastolic dysfunction).   Doppler parameters are consistent with high ventricular filling   pressure. - Aortic valve: Mildly to moderately calcified annulus. Trileaflet;   moderately thickened leaflets. Valve area (VTI): 1.88 cm^2. Valve   area (Vmax): 1.68 cm^2. - Mitral valve: Mildly to moderately calcified annulus. Mildly   thickened leaflets . There was mild regurgitation. - Left atrium: The atrium was severely dilated. - Right ventricle: The cavity size was mildly dilated. Systolic   function was mildly reduced. - Right atrium: The atrium was severely dilated. - Atrial septum: No defect or patent foramen ovale was identified. - Tricuspid valve: There was moderate regurgitation. - Pulmonary arteries: Systolic pressure was moderately increased.   PA peak pressure: 56 mm Hg (S). - Technically adequate study.   Antimicrobials:  Anti-infectives (From admission, onward)   None       Objective: Vitals:   02/14/18 0700 02/14/18 0730 02/14/18 0814 02/14/18 1250  BP: (!) 179/73 (!) 179/64 (!) 196/72 (!) (P) 187/70  Pulse:   65 (P) 63  Resp: 13 17 18  (P) 18  Temp:   98.6 F (37 C) (P) 98.1 F (36.7 C)  TempSrc:   Oral (P) Oral  SpO2:   100% (P) 99%  Weight:      Height:        Intake/Output Summary (Last 24 hours) at 02/14/2018 1331  Last data filed at 02/14/2018 1300 Gross per 24 hour  Intake 340 ml  Output -  Net 340 ml   Filed Weights   02/13/18 1158  Weight: 54.9 kg (121 lb)    Examination: General exam: Appears comfortable  HEENT: PERRLA, oral mucosa moist, no sclera icterus or thrush- small laceration over left eye Respiratory system: Clear to auscultation. Respiratory effort normal. Chest: mild tenderness on left breast Cardiovascular system: S1 & S2 heard, RRR.   Gastrointestinal system: Abdomen soft, non-tender, nondistended. Normal bowel sound. No organomegaly Central nervous system: Alert and oriented. No focal neurological  deficits. Extremities: No cyanosis, clubbing or edema Skin: No rashes or ulcers Psychiatry:  Mood & affect appropriate.     Data Reviewed: I have personally reviewed following labs and imaging studies  CBC: Recent Labs  Lab 02/13/18 1208  WBC 5.5  HGB 11.4*  HCT 37.0  MCV 106.0*  PLT 631*   Basic Metabolic Panel: Recent Labs  Lab 02/13/18 1208  NA 135  K 4.8  CL 97*  CO2 26  GLUCOSE 104*  BUN 48*  CREATININE 6.88*  CALCIUM 8.7*   GFR: Estimated Creatinine Clearance: 5.2 mL/min (A) (by C-G formula based on SCr of 6.88 mg/dL (H)). Liver Function Tests: No results for input(s): AST, ALT, ALKPHOS, BILITOT, PROT, ALBUMIN in the last 168 hours. No results for input(s): LIPASE, AMYLASE in the last 168 hours. No results for input(s): AMMONIA in the last 168 hours. Coagulation Profile: No results for input(s): INR, PROTIME in the last 168 hours. Cardiac Enzymes: Recent Labs  Lab 02/13/18 1208 02/13/18 1519 02/13/18 1651 02/13/18 1934 02/13/18 2233  TROPONINI 0.03* 0.04* 0.03* 0.04* 0.04*   BNP (last 3 results) No results for input(s): PROBNP in the last 8760 hours. HbA1C: No results for input(s): HGBA1C in the last 72 hours. CBG: No results for input(s): GLUCAP in the last 168 hours. Lipid Profile: No results for input(s): CHOL, HDL, LDLCALC, TRIG, CHOLHDL, LDLDIRECT in the last 72 hours. Thyroid Function Tests: No results for input(s): TSH, T4TOTAL, FREET4, T3FREE, THYROIDAB in the last 72 hours. Anemia Panel: No results for input(s): VITAMINB12, FOLATE, FERRITIN, TIBC, IRON, RETICCTPCT in the last 72 hours. Urine analysis:    Component Value Date/Time   COLORURINE YELLOW 12/16/2012 1919   APPEARANCEUR CLOUDY (A) 12/16/2012 1919   LABSPEC 1.009 12/16/2012 1919   PHURINE 7.5 12/16/2012 1919   GLUCOSEU NEGATIVE 12/16/2012 1919   HGBUR TRACE (A) 12/16/2012 1919   BILIRUBINUR NEGATIVE 12/16/2012 1919   KETONESUR NEGATIVE 12/16/2012 1919   PROTEINUR 100 (A)  12/16/2012 1919   UROBILINOGEN 0.2 12/16/2012 1919   NITRITE NEGATIVE 12/16/2012 1919   LEUKOCYTESUR SMALL (A) 12/16/2012 1919   Sepsis Labs: @LABRCNTIP (procalcitonin:4,lacticidven:4) )No results found for this or any previous visit (from the past 240 hour(s)).       Radiology Studies: Dg Chest 2 View  Result Date: 02/13/2018 CLINICAL DATA:  Chest pain. EXAM: CHEST - 2 VIEW COMPARISON:  Chest x-rays dated 12/19/2017 and 10/15/2017 FINDINGS: Chronic cardiomegaly.  CABG.  Coronary artery stents in place. New atelectasis at the left lung base. Scarring at the right lung base laterally. Chronic peribronchial thickening. No discrete effusions. No acute bone abnormality. Aortic atherosclerosis. IMPRESSION: 1. New slight linear atelectasis at the left lung base. 2. Chronic cardiomegaly. 3. Chronic scarring at the right lung base. 4. Chronic peribronchial thickening. Electronically Signed   By: Lorriane Shire M.D.   On: 02/13/2018 12:32      Scheduled Meds: .  amiodarone  200 mg Oral Daily  . benzonatate  200 mg Oral TID  . carvedilol  6.25 mg Oral BID WC  . Chlorhexidine Gluconate Cloth  6 each Topical Q0600  . chlorpheniramine-HYDROcodone  5 mL Oral Q12H  . cinacalcet  30 mg Oral Q supper  . dextromethorphan-guaiFENesin  1 tablet Oral BID  . escitalopram  10 mg Oral Daily  . famotidine  20 mg Oral BID  . hydrALAZINE  50 mg Oral Q8H  . isosorbide mononitrate  120 mg Oral Daily  . lanthanum  1,000 mg Oral TID WC  . lidocaine-prilocaine  1 application Topical Q M,W,F  . multivitamin  1 tablet Oral Daily  . psyllium  1 packet Oral QHS  . sevelamer carbonate  2,400 mg Oral BID WC  . sevelamer carbonate  800 mg Oral With snacks  . ticagrelor  60 mg Oral BID   Continuous Infusions: . sodium chloride    . sodium chloride       LOS: 0 days    Time spent in minutes: 35    Debbe Odea, MD [facility] Pager: [email or web address] Password [email or web address] 02/14/2018, 1:31 PM

## 2018-02-15 ENCOUNTER — Encounter (HOSPITAL_COMMUNITY): Payer: Self-pay

## 2018-02-15 ENCOUNTER — Observation Stay (HOSPITAL_BASED_OUTPATIENT_CLINIC_OR_DEPARTMENT_OTHER): Payer: Medicare HMO

## 2018-02-15 DIAGNOSIS — Z992 Dependence on renal dialysis: Secondary | ICD-10-CM

## 2018-02-15 DIAGNOSIS — R079 Chest pain, unspecified: Secondary | ICD-10-CM | POA: Diagnosis not present

## 2018-02-15 DIAGNOSIS — N186 End stage renal disease: Secondary | ICD-10-CM | POA: Diagnosis not present

## 2018-02-15 DIAGNOSIS — I5032 Chronic diastolic (congestive) heart failure: Secondary | ICD-10-CM | POA: Diagnosis not present

## 2018-02-15 DIAGNOSIS — R0789 Other chest pain: Secondary | ICD-10-CM

## 2018-02-15 DIAGNOSIS — I10 Essential (primary) hypertension: Secondary | ICD-10-CM | POA: Diagnosis not present

## 2018-02-15 DIAGNOSIS — D631 Anemia in chronic kidney disease: Secondary | ICD-10-CM | POA: Diagnosis not present

## 2018-02-15 LAB — NM MYOCAR MULTI W/SPECT W/WALL MOTION / EF
CHL CUP RESTING HR STRESS: 77 {beats}/min
LVDIAVOL: 112 mL (ref 46–106)
LVSYSVOL: 64 mL
NUC STRESS TID: 1.2
Peak HR: 97 {beats}/min
RATE: 0.44
SDS: 2
SRS: 5
SSS: 7

## 2018-02-15 LAB — HEPATITIS B SURFACE ANTIBODY, QUANTITATIVE: Hep B S AB Quant (Post): 15.2 m[IU]/mL (ref >9.9)

## 2018-02-15 IMAGING — NM NM MYOCAR MULTI W/SPECT W/WALL MOTION & EF
2 series · 12 of 12 positions shown · non-contrast
Comparison: none

[Series 1: rest · 6.51mm/px · 6 of 64 frames shown]
[frame 6/64]
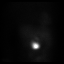
[frame 16/64]
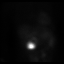
[frame 27/64]
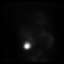
[frame 38/64]
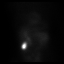
[frame 48/64]
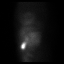
[frame 59/64]
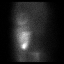

[Series 2: stress gated - perfusion · 6.51mm/px · 6 of 64 frames shown]
[frame 6/64]
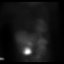
[frame 16/64]
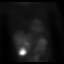
[frame 27/64]
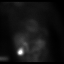
[frame 38/64]
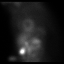
[frame 48/64]
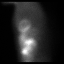
[frame 59/64]
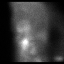

[12 of 12 positions shown; findings below may reference images not displayed]

Canned report from images found in remote index.

Refer to host system for actual result text.

## 2018-02-15 MED ORDER — SODIUM CHLORIDE 0.9% FLUSH
INTRAVENOUS | Status: AC
  Administered 2018-02-15: 10 mL via INTRAVENOUS
  Filled 2018-02-15: qty 160

## 2018-02-15 MED ORDER — REGADENOSON 0.4 MG/5ML IV SOLN
INTRAVENOUS | Status: AC
  Administered 2018-02-15: 0.4 mg via INTRAVENOUS
  Filled 2018-02-15: qty 5

## 2018-02-15 MED ORDER — CLONIDINE HCL 0.2 MG PO TABS
0.2000 mg | ORAL_TABLET | Freq: Two times a day (BID) | ORAL | Status: DC
  Administered 2018-02-15: 0.2 mg via ORAL
  Filled 2018-02-15 (×2): qty 1

## 2018-02-15 MED ORDER — ALBUTEROL SULFATE (2.5 MG/3ML) 0.083% IN NEBU
2.5000 mg | INHALATION_SOLUTION | RESPIRATORY_TRACT | Status: DC | PRN
  Administered 2018-02-15 (×3): 2.5 mg via RESPIRATORY_TRACT
  Filled 2018-02-15 (×3): qty 3

## 2018-02-15 MED ORDER — SODIUM CHLORIDE 0.9% FLUSH
INTRAVENOUS | Status: AC
  Administered 2018-02-15: 10 mL
  Filled 2018-02-15: qty 10

## 2018-02-15 MED ORDER — TECHNETIUM TC 99M TETROFOSMIN IV KIT
30.0000 | PACK | Freq: Once | INTRAVENOUS | Status: AC | PRN
  Administered 2018-02-15: 30 via INTRAVENOUS

## 2018-02-15 MED ORDER — HYDRALAZINE HCL 20 MG/ML IJ SOLN
5.0000 mg | Freq: Once | INTRAMUSCULAR | Status: AC
  Administered 2018-02-15: 5 mg via INTRAVENOUS
  Filled 2018-02-15: qty 1

## 2018-02-15 MED ORDER — TECHNETIUM TC 99M TETROFOSMIN IV KIT
10.0000 | PACK | Freq: Once | INTRAVENOUS | Status: AC | PRN
  Administered 2018-02-15: 10.3 via INTRAVENOUS

## 2018-02-15 MED ORDER — SIMVASTATIN 20 MG PO TABS: 20.0000 mg | ORAL_TABLET | Freq: Every day | ORAL | Status: DC

## 2018-02-15 NOTE — Progress Notes (Signed)
Subjective: Interval History: has complaints cough with whitish sputum production.  Complains of also knee pain and foot itching.  Denies any nausea or vomiting.  Denies any chest pain  Objective: Vital signs in last 24 hours: Temp:  [98 F (36.7 C)-98.9 F (37.2 C)] 98.5 F (36.9 C) (08/08 0531) Pulse Rate:  [63-79] 79 (08/08 0531) Resp:  [18] 18 (08/08 0531) BP: (136-208)/(55-93) 197/93 (08/08 0531) SpO2:  [87 %-99 %] 87 % (08/08 0810) Weight:  [55.5 kg-57.6 kg] 55.5 kg (08/07 1645) Weight change: 2.715 kg  Intake/Output from previous day: 08/07 0701 - 08/08 0700 In: 580 [P.O.:580] Out: 2054  Intake/Output this shift: No intake/output data recorded.  General appearance: alert, cooperative and no distress Resp: clear to auscultation bilaterally Cardio: regular rate and rhythm Extremities: No edema  Lab Results: Recent Labs    02/13/18 1208  WBC 5.5  HGB 11.4*  HCT 37.0  PLT 146*   BMET:  Recent Labs    02/13/18 1208  NA 135  K 4.8  CL 97*  CO2 26  GLUCOSE 104*  BUN 48*  CREATININE 6.88*  CALCIUM 8.7*   No results for input(s): PTH in the last 72 hours. Iron Studies: No results for input(s): IRON, TIBC, TRANSFERRIN, FERRITIN in the last 72 hours.  Studies/Results: Dg Chest 2 View  Result Date: 02/13/2018 CLINICAL DATA:  Chest pain. EXAM: CHEST - 2 VIEW COMPARISON:  Chest x-rays dated 12/19/2017 and 10/15/2017 FINDINGS: Chronic cardiomegaly.  CABG.  Coronary artery stents in place. New atelectasis at the left lung base. Scarring at the right lung base laterally. Chronic peribronchial thickening. No discrete effusions. No acute bone abnormality. Aortic atherosclerosis. IMPRESSION: 1. New slight linear atelectasis at the left lung base. 2. Chronic cardiomegaly. 3. Chronic scarring at the right lung base. 4. Chronic peribronchial thickening. Electronically Signed   By: Lorriane Shire M.D.   On: 02/13/2018 12:32    I have reviewed the patient's current  medications.  Assessment/Plan: 1] chest pain: Patient just came from stress test.  Presently she is feeling somewhat better. 2] end-stage renal disease: She is status post hemodialysis yesterday.  Potassium is normal and she did not have any uremic signs and symptoms. 3] history of cough with whitish sputum production.  Patient is a febrile with normal white blood cell count. 4] hypertension: Her blood pressure is high and fluctuating.  This happens also at home because of lack of inconsistency in her medication intake.  Patient is on clonidine 0.2 mg p.o. twice daily as an outpatient. 5] anemia: Her hemoglobin remains within our target goal 6] fluid management: No significant sign of fluid overload. 7] bone and mineral disorder: Calcium is a range. Plan: Patient does require dialysis today 2] her dialysis will be tomorrow which is her regular schedule. 3] we will make arrangement for her to get dialysis tomorrow 4] we will check her renal panel in the morning. 5] we will put her back on clonidine 0.2 mg p.o. twice daily  LOS: 0 days   Afrika Brick S 02/15/2018,9:54 AM

## 2018-02-15 NOTE — Progress Notes (Addendum)
Progress Note  Patient Name: Shawna [name] Date of Encounter: 02/15/2018  Primary Cardiologist: Kate Sable, MD   Subjective   Evaluated in Nuclear Medicine for 1-day NST. Had worsening dyspnea overnight but says this improved following her breathing treatment. Denies any chest pain or palpitations. Reports having persistent nausea.   Inpatient Medications    Scheduled Meds: . amiodarone  200 mg Oral Daily  . benzonatate  200 mg Oral TID  . carvedilol  6.25 mg Oral BID WC  . Chlorhexidine Gluconate Cloth  6 each Topical Q0600  . chlorpheniramine-HYDROcodone  5 mL Oral Q12H  . cinacalcet  30 mg Oral Q supper  . dextromethorphan-guaiFENesin  1 tablet Oral BID  . escitalopram  10 mg Oral Daily  . famotidine  20 mg Oral BID  . hydrALAZINE  50 mg Oral Q8H  . isosorbide mononitrate  120 mg Oral Daily  . lanthanum  1,000 mg Oral TID WC  . lidocaine-prilocaine  1 application Topical Q M,W,F  . multivitamin  1 tablet Oral Daily  . psyllium  1 packet Oral QHS  . sevelamer carbonate  2,400 mg Oral BID WC  . sevelamer carbonate  800 mg Oral With snacks  . simvastatin  20 mg Oral QHS  . sodium chloride flush      . ticagrelor  60 mg Oral BID   Continuous Infusions: . sodium chloride    . sodium chloride     PRN Meds: sodium chloride, sodium chloride, acetaminophen, albuterol, ALPRAZolam, bisacodyl, diphenhydrAMINE, diphenoxylate-atropine, fluticasone, gi cocktail, heparin, lidocaine (PF), lidocaine-prilocaine, loratadine, nitroGLYCERIN, ondansetron (ZOFRAN) IV, ondansetron, pentafluoroprop-tetrafluoroeth, technetium tetrofosmin   Vital Signs    Vitals:   02/15/18 0126 02/15/18 0531 02/15/18 0648 02/15/18 0810  BP: (!) 172/55 (!) 197/93    Pulse: 72 79    Resp:  18    Temp:  98.5 F (36.9 C)    TempSrc:  Oral    SpO2:  97% 93% (!) 87%  Weight:      Height:        Intake/Output Summary (Last 24 hours) at 02/15/2018 0941 Last data filed at 02/14/2018 1700 Gross per 24  hour  Intake 340 ml  Output 2054 ml  Net -1714 ml   Filed Weights   02/13/18 1158 02/14/18 1250 02/14/18 1645  Weight: 54.9 kg 57.6 kg 55.5 kg    Telemetry    NSR, HR in 70's to 80's.  - Personally Reviewed  ECG   No new tracings.   Physical Exam   General: Well developed, elderly African American female appearing in no acute distress. Head: Normocephalic, atraumatic.  Neck: Supple without bruits, JVD not elevated. Lungs:  Resp regular and unlabored, rhonchi throughout lung fields. No wheezing appreciated. Heart: RRR, S1, S2, no S3, S4, or murmur; no rub. Abdomen: Soft, non-tender, non-distended with normoactive bowel sounds. No hepatomegaly. No rebound/guarding. No obvious abdominal masses. Extremities: No clubbing, cyanosis, or edema. Distal pedal pulses are 2+ bilaterally. Neuro: Alert and oriented X 3. Moves all extremities spontaneously. Psych: Normal affect.  Labs    Chemistry Recent Labs  Lab 02/13/18 1208  NA 135  K 4.8  CL 97*  CO2 26  GLUCOSE 104*  BUN 48*  CREATININE 6.88*  CALCIUM 8.7*  GFRNONAA 5*  GFRAA 6*  ANIONGAP 12     Hematology Recent Labs  Lab 02/13/18 1208  WBC 5.5  RBC 3.49*  HGB 11.4*  HCT 37.0  MCV 106.0*  MCH 32.7  MCHC 30.8  RDW 15.6*  PLT 146*    Cardiac Enzymes Recent Labs  Lab 02/13/18 1519 02/13/18 1651 02/13/18 1934 02/13/18 2233  TROPONINI 0.04* 0.03* 0.04* 0.04*   No results for input(s): TROPIPOC in the last 168 hours.   BNPNo results for input(s): BNP, PROBNP in the last 168 hours.   DDimer No results for input(s): DDIMER in the last 168 hours.   Radiology    Dg Chest 2 View  Result Date: 02/13/2018 CLINICAL DATA:  Chest pain. EXAM: CHEST - 2 VIEW COMPARISON:  Chest x-rays dated 12/19/2017 and 10/15/2017 FINDINGS: Chronic cardiomegaly.  CABG.  Coronary artery stents in place. New atelectasis at the left lung base. Scarring at the right lung base laterally. Chronic peribronchial thickening. No discrete  effusions. No acute bone abnormality. Aortic atherosclerosis. IMPRESSION: 1. New slight linear atelectasis at the left lung base. 2. Chronic cardiomegaly. 3. Chronic scarring at the right lung base. 4. Chronic peribronchial thickening. Electronically Signed   By: Lorriane Shire M.D.   On: 02/13/2018 12:32    Cardiac Studies   Echocardiogram: 02/14/2018 Study Conclusions  - Left ventricle: The cavity size was normal. Wall thickness was   increased in a pattern of mild LVH. Systolic function was normal.   The estimated ejection fraction was in the range of 55% to 60%.   Wall motion was normal; there were no regional wall motion   abnormalities. Features are consistent with a pseudonormal left   ventricular filling pattern, with concomitant abnormal relaxation   and increased filling pressure (grade 2 diastolic dysfunction).   Doppler parameters are consistent with high ventricular filling   pressure. - Aortic valve: Mildly to moderately calcified annulus. Trileaflet;   moderately thickened leaflets. Valve area (VTI): 1.88 cm^2. Valve   area (Vmax): 1.68 cm^2. - Mitral valve: Mildly to moderately calcified annulus. Mildly   thickened leaflets . There was mild regurgitation. - Left atrium: The atrium was severely dilated. - Right ventricle: The cavity size was mildly dilated. Systolic   function was mildly reduced. - Right atrium: The atrium was severely dilated. - Atrial septum: No defect or patent foramen ovale was identified. - Tricuspid valve: There was moderate regurgitation. - Pulmonary arteries: Systolic pressure was moderately increased.   PA peak pressure: 56 mm Hg (S). - Technically adequate study.  Patient Profile     77 y.o. female w/ PMH of CAD (s/p CABG in 2013 with most recent DES to D1 in 10/2016 with cath showing patent LIMA-LAD, SVG-RI, SVG-Mrg and SVG-RCA), HTN, HLD, PAF (not on anticoagulation given history of GIB), and ESRD who presented to [facility] ED on   02/13/2018 for evaluation of chest pain. Cardiology consulted to assist with further evaluation.   Assessment & Plan    1. Atypical Chest Pain in the setting of known CAD - the patient presented for evaluation of chest pain which had been constant for over 5 hours. EKG showed no acute ischemic changes. Cyclic troponin values have been flat at 0.03, 0.04, 0.03, and 0.04 and not consistent with ACS. Echocardiogram shows a preserved EF of 55-60% with Grade 2 DD and no regional WMA.  - evaluated in Nuclear Medicine for 1-day NST. Developed dyspnea and leg discomfort with Lexiscan injection but symptoms resolved within several minutes. Official report pending following stress images.  - continue Brilinta 60mg  BID, BB, Imdur, and statin therapy.  2. HTN - BP has been variable at 136/55 - 208/93 within the past 24 hours. Remains on Coreg 6.25mg  BID, Imdur  120mg  daily, and Hydralazine which was just titrated from 50mg  BID to 50mg  TID upon admission. This can be further titrated pending BP response.    3. HLD - FLP from 10/2017 shows total cholesterol of 93, HDL 56, Triglycerides 56, and LDL 26. At goal of LDL < 70. - continue PTA Simvastatin.   4. Paroxysmal Atrial Fibrillation/ NSVT - she denies any recent palpitations. Has not been on anticoagulation given history of GIB. Remains on Amiodarone 200mg  daily and BB therapy.   5. ESRD - on HD - MWF schedule.  - Nephrology following.   6. Nausea/Abdominal Pain - reports her pain has resolved but still having some nausea this AM.  - further evaluation per admitting team.    For questions or updates, please contact [place] Please consult [email or web address] for contact info under Cardiology/STEMI.   Signed, Erma Heritage , PA-C 9:41 AM 02/15/2018 Pager: [phone]  Patient seen and examined   I have reviewed note above by B Strader and agree with assessment Pt is tired out from stress test   Denies CP On exam, lungs are CTA   Cardiac  exam   RRR   No murmur  Ext without edema Myovue scan today showed small region of probable scar in mid/distal anteiror/anteroseptal   LVEF calculated at 43% on myovue  Echo showed LVEF to be 55 to 605 with normal wall motion   Echo is more accurate. I would continue meds with modifications as noted above   CP does not appear to be ischemic in origin.  Dorris Carnes

## 2018-02-15 NOTE — Progress Notes (Signed)
Patient has refused her renvela and fosrenol today. Dr. Lowanda Foster verbally informed of patient's refusal to take prescribed medications.

## 2018-02-15 NOTE — Progress Notes (Signed)
Pt is NPO for stress test. Pt has poor appetite also. Renvela and Fosrenol have been held.

## 2018-02-15 NOTE — Discharge Summary (Signed)
Physician Discharge Summary  [name] XTK:[id number] DOB: 1940-10-24 DOA: 02/13/2018  PCP: Shawna Percy, MD  Admit date: 02/13/2018 Discharge date: 02/15/2018  Time spent: 45 minutes  Recommendations for Outpatient Follow-up:  -Will be discharged home today. -Advised to follow up with HD center as scheduled.   Discharge Diagnoses:  Principal Problem:   Chest pain Active Problems:   ESRD on hemodialysis ([place])   Hx of CABG   Chronic diastolic CHF (congestive heart failure) ([place])   Essential hypertension   Discharge Condition: Stable and improved  Filed Weights   02/13/18 1158 02/14/18 1250 02/14/18 1645  Weight: 54.9 kg 57.6 kg 55.5 kg    History of present illness:  As per Dr. Shanon [name] on 8/6: Shawna [name] is a 77 y.o. female with medical history significant of end-stage renal disease on dialysis Monday Wednesday Friday status post CABG, hypertension comes in with sudden onset of substernal chest pain early this morning without any radiation that lasted over an hour.  Patient took to her nitroglycerin at home and improved her symptoms.  She is currently chest pain-free after being placed on nitroglycerin paste.  She denies any fevers or cough.  She denies any lower extremity swelling or edema.  She denies any shortness of breath at this time.  Patient referred for admission for further cardiac evaluation.  Hospital Course:   Atypical CP in the setting of CAD -Troponins have been flat, EKG was non-acute. -Lexiscan myoview: with small scar in mid distal anterior wall. Per cardiology low risk study and no further workup is recommended. -Continue current medications. -Cardiology has been following. [place] for DC. -She appears to possibly have a viral URI that could have precipitated her CP.  ESRD -Has been dialyzed on scheduled while hospitalized.  Rest of chronic conditions have been stable during this short hospitalization.  Procedures:  Myoview as above    Consultations:  Cardiology  Nephrology  Discharge Instructions  Discharge Instructions    Diet - low sodium heart healthy   Complete by:  As directed    Increase activity slowly   Complete by:  As directed      Allergies as of 02/15/2018      Reactions   Aspirin Other (See Comments)   High Doses Mess up her stomach; "makes my bowels have blood in them". Takes 81 mg EC Aspirin    Penicillins Other (See Comments)   SYNCOPE? , "makes me real weak when I take it; like I'll pass out" Has patient had a PCN reaction causing immediate rash, facial/tongue/throat swelling, SOB or lightheadedness with hypotension: Yes Has patient had a PCN reaction causing severe rash involving mucus membranes or skin necrosis: no Has patient had a PCN reaction that required hospitalization no Has patient had a PCN reaction occurring within the last 10 years: no If all of the above   Amlodipine Swelling   Bactrim [sulfamethoxazole-trimethoprim] Rash   Contrast Media [iodinated Diagnostic Agents] Itching   Iron Itching, Other (See Comments)   "they gave me iron in dialysis; had to give me Benadryl cause I had to have the iron" (05/02/2012)   Nitrofurantoin Hives   Tylenol [acetaminophen] Itching, Other (See Comments)   Makes her feet on fire per pt   Gabapentin Other (See Comments)   Unknown reaction   Dexilant [dexlansoprazole] Other (See Comments)   Upset stomach   Levaquin [levofloxacin In D5w] Rash   Morphine And Related Itching, Other (See Comments)   Itching in feet  Plavix [clopidogrel Bisulfate] Rash   Protonix [pantoprazole Sodium] Rash   Venofer [ferric Oxide] Itching, Other (See Comments)   Patient reports using Benadryl prior to doses as [place]      Medication List    STOP taking these medications   guaiFENesin 100 MG/5ML Soln Commonly known as:  ROBITUSSIN     TAKE these medications   ALPRAZolam 0.25 MG tablet Commonly known as:  XANAX Take 1 tablet (0.25 mg  total) by mouth 2 (two) times daily as needed for anxiety or sleep.   amiodarone 200 MG tablet Commonly known as:  PACERONE Take 1 tablet (200 mg total) by mouth daily.   aspirin EC 81 MG tablet Take 81 mg by mouth once. FOR CHEST PAIN   BENEFIBER DRINK MIX Pack Take 4 g by mouth at bedtime.   bisacodyl 10 MG suppository Commonly known as:  DULCOLAX Place 1 suppository (10 mg total) rectally as needed for moderate constipation.   BRILINTA 60 MG Tabs tablet Generic drug:  ticagrelor TAKE 1 TABLET TWICE DAILY   carvedilol 6.25 MG tablet Commonly known as:  COREG TAKE 1 TABLET BY MOUTH TWICE DAILY WITH  A  MEAL What changed:  See the new instructions.   cinacalcet 30 MG tablet Commonly known as:  SENSIPAR Take 30 mg by mouth daily after supper.   cloNIDine 0.2 MG tablet Commonly known as:  CATAPRES Take 0.1-0.2 mg by mouth daily as needed (high blood pressure).   diphenhydrAMINE 25 mg capsule Commonly known as:  BENADRYL Take 50 mg by mouth daily as needed for itching or allergies.   diphenoxylate-atropine 2.5-0.025 MG tablet Commonly known as:  LOMOTIL Take 1 tablet by mouth 4 (four) times daily as needed for diarrhea or loose stools.   epoetin alfa 10000 UNIT/ML injection Commonly known as:  EPOGEN,PROCRIT Inject 1 mL (10,000 Units total) into the vein every Monday, Wednesday, and Friday with hemodialysis.   escitalopram 10 MG tablet Commonly known as:  LEXAPRO   ferrous sulfate 325 (65 FE) MG EC tablet Take 1 tablet (325 mg total) by mouth 2 (two) times daily with a meal. What changed:  when to take this   fluticasone 50 MCG/ACT nasal spray Commonly known as:  FLONASE Place 1 spray at bedtime as needed into both nostrils for allergies.   FOSRENOL 1000 MG chewable tablet Generic drug:  lanthanum Chew by mouth.   hydrALAZINE 50 MG tablet Commonly known as:  APRESOLINE Take 50 mg by mouth 2 (two) times daily as needed (high blood pressure).   isosorbide  mononitrate 120 MG 24 hr tablet Commonly known as:  IMDUR TAKE 1 TABLET BY MOUTH ONCE DAILY   lidocaine-prilocaine cream Commonly known as:  EMLA Apply 1 application topically every Monday, Wednesday, and Friday. Prior to dialysis   loratadine 10 MG tablet Commonly known as:  CLARITIN Take 10 mg by mouth daily as needed for allergies.   multivitamin Tabs tablet Take 1 tablet by mouth daily.   nitroGLYCERIN 0.4 MG SL tablet Commonly known as:  NITROSTAT Place 1 tablet (0.4 mg total) under the tongue every 5 (five) minutes as needed for chest pain.   omeprazole 20 MG capsule Commonly known as:  PRILOSEC Take 1 capsule (20 mg total) by mouth daily.   ondansetron 4 MG disintegrating tablet Commonly known as:  ZOFRAN-ODT Take 4 mg by mouth every 8 (eight) hours as needed for nausea or vomiting.   sevelamer carbonate 800 MG tablet Commonly known as:  RENVELA Take 800-2,400 mg by mouth See admin instructions. Take 3 tablets (2400) by mouth twice daily with meals, take 1 tablet (800 mg) with snacks   simvastatin 20 MG tablet Commonly known as:  ZOCOR TAKE ONE TABLET BY MOUTH AT BEDTIME What changed:    how much to take  how to take this  when to take this      Allergies  Allergen Reactions  . Aspirin Other (See Comments)    High Doses Mess up her stomach; "makes my bowels have blood in them". Takes 81 mg EC Aspirin   . Penicillins Other (See Comments)    SYNCOPE? , "makes me real weak when I take it; like I'll pass out"  Has patient had a PCN reaction causing immediate rash, facial/tongue/throat swelling, SOB or lightheadedness with hypotension: Yes Has patient had a PCN reaction causing severe rash involving mucus membranes or skin necrosis: no Has patient had a PCN reaction that required hospitalization no Has patient had a PCN reaction occurring within the last 10 years: no If all of the above  . Amlodipine Swelling  . Bactrim [Sulfamethoxazole-Trimethoprim] Rash   . Contrast Media [Iodinated Diagnostic Agents] Itching  . Iron Itching and Other (See Comments)    "they gave me iron in dialysis; had to give me Benadryl cause I had to have the iron" (05/02/2012)  . Nitrofurantoin Hives  . Tylenol [Acetaminophen] Itching and Other (See Comments)    Makes her feet on fire per pt  . Gabapentin Other (See Comments)    Unknown reaction  . Dexilant [Dexlansoprazole] Other (See Comments)    Upset stomach  . Levaquin [Levofloxacin In D5w] Rash  . Morphine And Related Itching and Other (See Comments)    Itching in feet  . Plavix [Clopidogrel Bisulfate] Rash  . Protonix [Pantoprazole Sodium] Rash  . Venofer [Ferric Oxide] Itching and Other (See Comments)    Patient reports using Benadryl prior to doses as [place]    Shawna Percy, MD. Schedule an appointment as soon as possible for a visit in 2 week(s).   Specialty:  Family Medicine Contact information: [place] [address] [phone]        Herminio Commons, MD .   Specialty:  Cardiology Contact information: [place] [address] [phone]            The results of significant diagnostics from this hospitalization (including imaging, microbiology, ancillary and laboratory) are listed below for reference.    Significant Diagnostic Studies: Dg Chest 2 View  Result Date: 02/13/2018 CLINICAL DATA:  Chest pain. EXAM: CHEST - 2 VIEW COMPARISON:  Chest x-rays dated 12/19/2017 and 10/15/2017 FINDINGS: Chronic cardiomegaly.  CABG.  Coronary artery stents in place. New atelectasis at the left lung base. Scarring at the right lung base laterally. Chronic peribronchial thickening. No discrete effusions. No acute bone abnormality. Aortic atherosclerosis. IMPRESSION: 1. New slight linear atelectasis at the left lung base. 2. Chronic cardiomegaly. 3. Chronic scarring at the right lung base. 4. Chronic peribronchial thickening. Electronically  Signed   By: Lorriane Shire M.D.   On: 02/13/2018 12:32   Ct Head Wo Contrast  Result Date: 01/31/2018 CLINICAL DATA:  77 year old female with a history of fall and head injury EXAM: CT HEAD WITHOUT CONTRAST TECHNIQUE: Contiguous axial images were obtained from the base of the skull through the vertex without intravenous contrast. COMPARISON:  07/27/2016 FINDINGS: Brain: No acute intracranial hemorrhage. No  midline shift or mass effect. Gray-white differentiation maintained. Unremarkable appearance of the ventricular system. Patchy hypodensity in the bilateral periventricular white matter. Vascular: Calcifications of the intracranial vasculature Skull: No acute fracture.  No aggressive bone lesion identified. Sinuses/Orbits: Unremarkable appearance of the orbits. Mastoid air cells clear. No middle ear effusion. No significant sinus disease. Other: None IMPRESSION: Negative head CT for acute intracranial abnormality. Evidence of chronic microvascular ischemic disease and associated intracranial atherosclerosis. Electronically Signed   By: Corrie Mckusick D.O.   On: 01/31/2018 18:55   Nm Myocar Multi W/spect W/wall Motion / Ef  Result Date: 02/15/2018  EKG changes are nondiagnostic due to baseline changes  Small region of dcreased tracer activity in the mid/distal anferior/anteorseptal wall consistent with scar. No significant ischemia  This is an intermediate risk study.     Microbiology: Recent Results (from the past 240 hour(s))  MRSA PCR Screening     Status: None   Collection Time: 02/14/18 11:32 AM  Result Value Ref Range Status   MRSA by PCR NEGATIVE NEGATIVE Final    Comment:        The GeneXpert MRSA Assay (FDA approved for NASAL specimens only), is one component of a comprehensive MRSA colonization surveillance program. It is not intended to diagnose MRSA infection nor to guide or monitor treatment for MRSA infections. Performed at [facility], [address], [place], [place]  [address]      Labs: Basic Metabolic Panel: Recent Labs  Lab 02/13/18 1208  NA 135  K 4.8  CL 97*  CO2 26  GLUCOSE 104*  BUN 48*  CREATININE 6.88*  CALCIUM 8.7*   Liver Function Tests: No results for input(s): AST, ALT, ALKPHOS, BILITOT, PROT, ALBUMIN in the last 168 hours. No results for input(s): LIPASE, AMYLASE in the last 168 hours. No results for input(s): AMMONIA in the last 168 hours. CBC: Recent Labs  Lab 02/13/18 1208  WBC 5.5  HGB 11.4*  HCT 37.0  MCV 106.0*  PLT 146*   Cardiac Enzymes: Recent Labs  Lab 02/13/18 1208 02/13/18 1519 02/13/18 1651 02/13/18 1934 02/13/18 2233  TROPONINI 0.03* 0.04* 0.03* 0.04* 0.04*   BNP: BNP (last 3 results) No results for input(s): BNP in the last 8760 hours.  ProBNP (last 3 results) No results for input(s): PROBNP in the last 8760 hours.  CBG: No results for input(s): GLUCAP in the last 168 hours.     Signed:  Lelon Frohlich  [facility] Pager: [phone] 02/15/2018, 5:44 PM

## 2018-02-15 NOTE — Progress Notes (Signed)
Patient refusing stress test this morning. Patient stated that she will go later if given a Xanax and breathing treatment. Will continue to monitor.

## 2018-02-15 NOTE — Progress Notes (Signed)
Pt's IV catheter removed and intact. Discharge instructions including medications and follow up appointments were reviewed and discussed with patient. All questions were answered and no further questions at this time. Pt will be escorted by nurse tech.

## 2018-02-16 DIAGNOSIS — Z79899 Other long term (current) drug therapy: Secondary | ICD-10-CM | POA: Diagnosis not present

## 2018-02-16 DIAGNOSIS — J189 Pneumonia, unspecified organism: Secondary | ICD-10-CM | POA: Diagnosis not present

## 2018-02-16 DIAGNOSIS — R05 Cough: Secondary | ICD-10-CM | POA: Diagnosis not present

## 2018-02-16 DIAGNOSIS — I509 Heart failure, unspecified: Secondary | ICD-10-CM | POA: Diagnosis not present

## 2018-02-16 DIAGNOSIS — N186 End stage renal disease: Secondary | ICD-10-CM | POA: Diagnosis not present

## 2018-02-16 DIAGNOSIS — Z7902 Long term (current) use of antithrombotics/antiplatelets: Secondary | ICD-10-CM | POA: Diagnosis not present

## 2018-02-16 DIAGNOSIS — Z8543 Personal history of malignant neoplasm of ovary: Secondary | ICD-10-CM | POA: Diagnosis not present

## 2018-02-16 DIAGNOSIS — Z8673 Personal history of transient ischemic attack (TIA), and cerebral infarction without residual deficits: Secondary | ICD-10-CM | POA: Diagnosis not present

## 2018-02-16 DIAGNOSIS — R918 Other nonspecific abnormal finding of lung field: Secondary | ICD-10-CM | POA: Diagnosis not present

## 2018-02-16 DIAGNOSIS — Z992 Dependence on renal dialysis: Secondary | ICD-10-CM | POA: Diagnosis not present

## 2018-02-16 DIAGNOSIS — I132 Hypertensive heart and chronic kidney disease with heart failure and with stage 5 chronic kidney disease, or end stage renal disease: Secondary | ICD-10-CM | POA: Diagnosis not present

## 2018-02-17 DIAGNOSIS — Z992 Dependence on renal dialysis: Secondary | ICD-10-CM | POA: Diagnosis not present

## 2018-02-17 DIAGNOSIS — N186 End stage renal disease: Secondary | ICD-10-CM | POA: Diagnosis not present

## 2018-02-19 DIAGNOSIS — N186 End stage renal disease: Secondary | ICD-10-CM | POA: Diagnosis not present

## 2018-02-19 DIAGNOSIS — Z992 Dependence on renal dialysis: Secondary | ICD-10-CM | POA: Diagnosis not present

## 2018-02-21 DIAGNOSIS — N186 End stage renal disease: Secondary | ICD-10-CM | POA: Diagnosis not present

## 2018-02-21 DIAGNOSIS — Z992 Dependence on renal dialysis: Secondary | ICD-10-CM | POA: Diagnosis not present

## 2018-02-23 DIAGNOSIS — N186 End stage renal disease: Secondary | ICD-10-CM | POA: Diagnosis not present

## 2018-02-23 DIAGNOSIS — Z992 Dependence on renal dialysis: Secondary | ICD-10-CM | POA: Diagnosis not present

## 2018-02-24 DIAGNOSIS — Z992 Dependence on renal dialysis: Secondary | ICD-10-CM | POA: Diagnosis not present

## 2018-02-24 DIAGNOSIS — N186 End stage renal disease: Secondary | ICD-10-CM | POA: Diagnosis not present

## 2018-02-24 DIAGNOSIS — E8770 Fluid overload, unspecified: Secondary | ICD-10-CM | POA: Diagnosis not present

## 2018-02-26 DIAGNOSIS — J180 Bronchopneumonia, unspecified organism: Secondary | ICD-10-CM | POA: Diagnosis not present

## 2018-02-26 DIAGNOSIS — R05 Cough: Secondary | ICD-10-CM | POA: Diagnosis not present

## 2018-02-26 DIAGNOSIS — N186 End stage renal disease: Secondary | ICD-10-CM | POA: Diagnosis not present

## 2018-02-26 DIAGNOSIS — I120 Hypertensive chronic kidney disease with stage 5 chronic kidney disease or end stage renal disease: Secondary | ICD-10-CM | POA: Diagnosis not present

## 2018-02-26 DIAGNOSIS — J690 Pneumonitis due to inhalation of food and vomit: Secondary | ICD-10-CM | POA: Diagnosis not present

## 2018-02-26 DIAGNOSIS — Z992 Dependence on renal dialysis: Secondary | ICD-10-CM | POA: Diagnosis not present

## 2018-02-26 DIAGNOSIS — Z8639 Personal history of other endocrine, nutritional and metabolic disease: Secondary | ICD-10-CM | POA: Diagnosis not present

## 2018-02-26 DIAGNOSIS — Z8543 Personal history of malignant neoplasm of ovary: Secondary | ICD-10-CM | POA: Diagnosis not present

## 2018-02-26 DIAGNOSIS — Z8673 Personal history of transient ischemic attack (TIA), and cerebral infarction without residual deficits: Secondary | ICD-10-CM | POA: Diagnosis not present

## 2018-02-26 DIAGNOSIS — I10 Essential (primary) hypertension: Secondary | ICD-10-CM | POA: Diagnosis not present

## 2018-02-26 DIAGNOSIS — I700 Atherosclerosis of aorta: Secondary | ICD-10-CM | POA: Diagnosis not present

## 2018-02-26 DIAGNOSIS — M1990 Unspecified osteoarthritis, unspecified site: Secondary | ICD-10-CM | POA: Diagnosis not present

## 2018-02-26 DIAGNOSIS — R112 Nausea with vomiting, unspecified: Secondary | ICD-10-CM | POA: Diagnosis not present

## 2018-02-28 DIAGNOSIS — Z992 Dependence on renal dialysis: Secondary | ICD-10-CM | POA: Diagnosis not present

## 2018-02-28 DIAGNOSIS — N186 End stage renal disease: Secondary | ICD-10-CM | POA: Diagnosis not present

## 2018-03-02 DIAGNOSIS — N186 End stage renal disease: Secondary | ICD-10-CM | POA: Diagnosis not present

## 2018-03-02 DIAGNOSIS — Z992 Dependence on renal dialysis: Secondary | ICD-10-CM | POA: Diagnosis not present

## 2018-03-05 DIAGNOSIS — Z992 Dependence on renal dialysis: Secondary | ICD-10-CM | POA: Diagnosis not present

## 2018-03-05 DIAGNOSIS — N186 End stage renal disease: Secondary | ICD-10-CM | POA: Diagnosis not present

## 2018-03-06 ENCOUNTER — Telehealth: Payer: Self-pay | Admitting: Cardiovascular Disease

## 2018-03-06 NOTE — Telephone Encounter (Signed)
Pt confirmed she was taking hydralazine 50 mg bid and will increase 75 mg bid - updated medication list

## 2018-03-06 NOTE — Telephone Encounter (Signed)
Let's try increasing hydralazine to 75 mg bid (if she is taking 50 mg bid currently).

## 2018-03-06 NOTE — Telephone Encounter (Signed)
Feet swelling and said she thinks her BP has been higher than normal  Goes up and down.

## 2018-03-06 NOTE — Telephone Encounter (Signed)
Pt says for the last week SBP has been ranging from 120s-200s - pt says she take clonidine when its high and this brings it back down and also has chest tightness with SPB >180 - HR remaining in the 50s - says she has been swelling in both feet for the last week as well - denies any weight gain says she has actually lost weight and is down to 120lbs - denies any worsening weakness - concerned about BP and chest tightness

## 2018-03-07 DIAGNOSIS — N186 End stage renal disease: Secondary | ICD-10-CM | POA: Diagnosis not present

## 2018-03-07 DIAGNOSIS — Z992 Dependence on renal dialysis: Secondary | ICD-10-CM | POA: Diagnosis not present

## 2018-03-08 ENCOUNTER — Ambulatory Visit (INDEPENDENT_AMBULATORY_CARE_PROVIDER_SITE_OTHER): Payer: Medicare HMO | Admitting: Internal Medicine

## 2018-03-08 ENCOUNTER — Encounter (INDEPENDENT_AMBULATORY_CARE_PROVIDER_SITE_OTHER): Payer: Self-pay | Admitting: Internal Medicine

## 2018-03-08 VITALS — BP 160/70 | HR 64 | Temp 97.9°F | Ht 61.0 in | Wt 124.2 lb

## 2018-03-08 DIAGNOSIS — R195 Other fecal abnormalities: Secondary | ICD-10-CM | POA: Diagnosis not present

## 2018-03-08 DIAGNOSIS — K922 Gastrointestinal hemorrhage, unspecified: Secondary | ICD-10-CM | POA: Diagnosis not present

## 2018-03-08 LAB — HEMOGLOBIN AND HEMATOCRIT, BLOOD
HCT: 30.8 % — ABNORMAL LOW (ref 35.0–45.0)
Hemoglobin: 9.8 g/dL — ABNORMAL LOW (ref 11.7–15.5)

## 2018-03-08 NOTE — Patient Instructions (Signed)
H and H today 

## 2018-03-08 NOTE — Progress Notes (Signed)
Subjective:    Patient ID: Shawna [name], female    DOB: 1941-04-13, 77 y.o.   MRN: [id number] PCP Dr. Nadara Mustard.  HPI Here today with c/o dark stools yesterday. Stools were dark brown in color.  Hx of GI bleed in past.  Appetite is okay. Weight has remained the same. Has a BM 2-3 a day. No abdominal pain.  C/o rectal itching.  States she is on antibiotic for pneumonia. Seen at [facility].  She underwent an EGD in January of 2018  Showed a 2 cm hiatal hernia. Normal stomach. A single duodenal versus jejunal polyp- suspect adenoma, not removed. A single angiodysplastic lesion in the jejunum.  Treated with argon plasma coagulation . (Anemia secondary to blood loss). Dr. Havery Moros.  Hx of GI bleed due to small bowel angiodysplasia.  Dialysis M-W-F.   Last colonoscopy in 2013 by Dr. Britta Mccreedy, Heme positive stool, anemia.  Two polyps in cecum removed. Moderate diverticulosis thru out the colon. Surgical anastomosis, patent, involving the DC to rectum. Sessile polyp in the descending colon. All polyps were tubular adenoma.   CBC    Component Value Date/Time   WBC 5.5 02/13/2018 1208   RBC 3.49 (L) 02/13/2018 1208   HGB 11.4 (L) 02/13/2018 1208   HCT 37.0 02/13/2018 1208   PLT 146 (L) 02/13/2018 1208   MCV 106.0 (H) 02/13/2018 1208   MCH 32.7 02/13/2018 1208   MCHC 30.8 02/13/2018 1208   RDW 15.6 (H) 02/13/2018 1208   LYMPHSABS 1.1 10/15/2017 0420   MONOABS 0.3 10/15/2017 0420   EOSABS 0.3 10/15/2017 0420   BASOSABS 0.1 10/15/2017 0420     Review of Systems Past Medical History:  Diagnosis Date  . Acute on chronic respiratory failure with hypoxia ([place]) 10/10/2016  . Anxiety   . Arthritis   . AVM (arteriovenous malformation) of colon   . CAD (coronary artery disease)    a. 12/2011 NSTEMI/Cath/PCI LCX (2.25x14 Resolute DES) & D1 (2.25x22 Resolute DES);  b. 01/2012 Cath/PCI: LM 30, LAD 30p, 40-31m, D1 stent ok, 99 in sm branch of diag, LCX patent stent, OM1 20, RCA 95 ost (4.0x12 Promus  DES), EF 55%;  c. 04/2012 Lexi Cardiolite  EF 48%, small area of scar @ base/mid inflat wall with mild peri-infarct ischemia.; CABG 12/4  . Carotid artery disease ([place])    a. 29-52% LICA, 02/4131   . Chronic bronchitis ([place])   . Chronic diastolic CHF (congestive heart failure) ([place])    a. 02/2012 Echo EF 60-65%, nl wall motion, Gr 1 DD, mod MR  . Colon cancer ([place]) 1992  . Esophageal stricture   . ESRD on hemodialysis ([place])    ESRD due to HTN, started dialysis 2011 and gets HD at [facility] with Dr Hinda Lenis on MWF schedule.  Access is LUA AVF as of Sept 2014.   Marland Kitchen GERD (gastroesophageal reflux disease)   . High cholesterol 12/2011  . History of blood transfusion 07/2011; 12/2011; 01/2012 X 2; 04/2012  . History of gout   . History of lower GI bleeding   . Hypertension   . Iron deficiency anemia   . Mitral regurgitation    a. Moderate by echo, 02/2012  . Myocardial infarction ([place])   . Ovarian cancer ([place]) 1992  . Pneumonia ~ 2009  . PUD (peptic ulcer disease)   . TIA (transient ischemic attack)     Past Surgical History:  Procedure Laterality Date  . ABDOMINAL HYSTERECTOMY  1992  . APPENDECTOMY  06/1990  .  AV FISTULA PLACEMENT  07/2009   left upper arm  . AV FISTULA PLACEMENT Right 09/06/2016   Procedure: RIGHT FOREARM ARTERIOVENOUS (AV) GRAFT;  Surgeon: Elam Dutch, MD;  Location: [facility] OR;  Service: Vascular;  Laterality: Right;  . AV FISTULA PLACEMENT N/A 02/24/2017   Procedure: INSERTION OF ARTERIOVENOUS (AV) GORE-TEX GRAFT ARM (BRACHIAL AXILLARY);  Surgeon: Katha Cabal, MD;  Location: [facility] ORS;  Service: Vascular;  Laterality: N/A;  . [place] Right 09/06/2016   Procedure: REMOVAL OF Right Arm ARTERIOVENOUS GORETEX GRAFT and Vein Patch angioplasty of brachial artery;  Surgeon: Angelia Mould, MD;  Location: [place];  Service: Vascular;  Laterality: Right;  . COLON RESECTION  1992  . COLON SURGERY    . CORONARY ANGIOPLASTY WITH STENT PLACEMENT  12/15/11   "2"  .  CORONARY ANGIOPLASTY WITH STENT PLACEMENT  y/2013   "1; makes total of 3" (05/02/2012)  . CORONARY ARTERY BYPASS GRAFT  06/13/2012   Procedure: CORONARY ARTERY BYPASS GRAFTING (CABG);  Surgeon: Grace Isaac, MD;  Location: [place];  Service: Open Heart Surgery;  Laterality: N/A;  cabg x four;  using left internal mammary artery, and left leg greater saphenous vein harvested endoscopically  . CORONARY STENT INTERVENTION N/A 10/13/2016   Procedure: Coronary Stent Intervention;  Surgeon: Troy Sine, MD;  Location: [place] CV LAB;  Service: Cardiovascular;  Laterality: N/A;  . DIALYSIS/PERMA CATHETER REMOVAL N/A 04/18/2017   Procedure: DIALYSIS/PERMA CATHETER REMOVAL;  Surgeon: Katha Cabal, MD;  Location: [place] CV LAB;  Service: Cardiovascular;  Laterality: N/A;  . DILATION AND CURETTAGE OF UTERUS    . ESOPHAGOGASTRODUODENOSCOPY  01/20/2012   Procedure: ESOPHAGOGASTRODUODENOSCOPY (EGD);  Surgeon: Ladene Artist, MD,FACG;  Location: [facility] ENDOSCOPY;  Service: Endoscopy;  Laterality: N/A;  . ESOPHAGOGASTRODUODENOSCOPY N/A 03/26/2013   Procedure: ESOPHAGOGASTRODUODENOSCOPY (EGD);  Surgeon: Irene Shipper, MD;  Location: [facility] ENDOSCOPY;  Service: Endoscopy;  Laterality: N/A;  . ESOPHAGOGASTRODUODENOSCOPY N/A 04/30/2015   Procedure: ESOPHAGOGASTRODUODENOSCOPY (EGD);  Surgeon: Rogene Houston, MD;  [facility];  Service: Endoscopy;  Laterality: N/A;  1pm - moved to 10/20 @ 1:10  . ESOPHAGOGASTRODUODENOSCOPY N/A 07/29/2016   Procedure: ESOPHAGOGASTRODUODENOSCOPY (EGD);  Surgeon: Manus Gunning, MD;  Location: [place];  Service: Gastroenterology;  Laterality: N/A;  enteroscopy  . INTRAOPERATIVE TRANSESOPHAGEAL ECHOCARDIOGRAM  06/13/2012   Procedure: INTRAOPERATIVE TRANSESOPHAGEAL ECHOCARDIOGRAM;  Surgeon: Grace Isaac, MD;  Location: [place];  Service: Open Heart Surgery;  Laterality: N/A;  . IR GENERIC HISTORICAL  07/26/2016   IR FLUORO GUIDE CV LINE RIGHT 07/26/2016  Greggory Keen, MD [facility]-INTER[facility] RAD  . IR GENERIC HISTORICAL  07/26/2016   IR US GUIDE VASC ACCESS RIGHT 07/26/2016 Greggory Keen, MD [facility]  . IR GENERIC HISTORICAL  08/02/2016   IR US GUIDE VASC ACCESS RIGHT 08/02/2016 Greggory Keen, MD [facility]  . IR GENERIC HISTORICAL  08/02/2016   IR FLUORO GUIDE CV LINE RIGHT 08/02/2016 Greggory Keen, MD [facility]  . IR RADIOLOGY PERIPHERAL GUIDED IV START  03/28/2017  . IR US GUIDE VASC ACCESS RIGHT  03/28/2017  . LEFT HEART CATH AND CORONARY ANGIOGRAPHY N/A 09/20/2016   Procedure: Left Heart Cath and Coronary Angiography;  Surgeon: Shawna Crome, MD;  Location: [place] CV LAB;  Service: Cardiovascular;  Laterality: N/A;  . LEFT HEART CATH AND CORS/GRAFTS ANGIOGRAPHY N/A 10/13/2016   Procedure: Left Heart Cath and Cors/Grafts Angiography;  Surgeon: Troy Sine, MD;  Location: [place] CV LAB;  Service: Cardiovascular;  Laterality: N/A;  . LEFT HEART CATHETERIZATION WITH CORONARY ANGIOGRAM N/A 12/15/2011   Procedure: LEFT HEART CATHETERIZATION WITH CORONARY ANGIOGRAM;  Surgeon: Burnell Blanks, MD;  Location: [facility] CATH LAB;  Service: Cardiovascular;  Laterality: N/A;  . LEFT HEART CATHETERIZATION WITH CORONARY ANGIOGRAM N/A 01/10/2012   Procedure: LEFT HEART CATHETERIZATION WITH CORONARY ANGIOGRAM;  Surgeon: Peter M Martinique, MD;  Location: [facility] CATH LAB;  Service: Cardiovascular;  Laterality: N/A;  . LEFT HEART CATHETERIZATION WITH CORONARY ANGIOGRAM N/A 06/08/2012   Procedure: LEFT HEART CATHETERIZATION WITH CORONARY ANGIOGRAM;  Surgeon: Burnell Blanks, MD;  Location: [facility] CATH LAB;  Service: Cardiovascular;  Laterality: N/A;  . LEFT HEART CATHETERIZATION WITH CORONARY/GRAFT ANGIOGRAM N/A 12/10/2013   Procedure: LEFT HEART CATHETERIZATION WITH Beatrix Fetters;  Surgeon: [name], MD;  Location: [facility] CATH LAB;  Service: Cardiovascular;  Laterality: N/A;  . OVARY SURGERY     ovarian cancer  . REVISION OF ARTERIOVENOUS  GORETEX GRAFT N/A 02/24/2017   Procedure: REVISION OF ARTERIOVENOUS GORETEX GRAFT (RESECTION);  Surgeon: Katha Cabal, MD;  Location: [facility] ORS;  Service: Vascular;  Laterality: N/A;  Earney [place] N/A 10/15/2013   Procedure: Fistulogram;  Surgeon: Serafina Mitchell, MD;  Location: [facility] CATH LAB;  Service: Cardiovascular;  Laterality: N/A;  . THROMBECTOMY / ARTERIOVENOUS GRAFT REVISION  2011   left upper arm  . TUBAL LIGATION  1980's  . UPPER EXTREMITY ANGIOGRAPHY Bilateral 12/06/2016   Procedure: Upper Extremity Angiography;  Surgeon: Katha Cabal, MD;  Location: [place] CV LAB;  Service: Cardiovascular;  Laterality: Bilateral;  . UPPER EXTREMITY INTERVENTION Left 06/06/2017   Procedure: UPPER EXTREMITY INTERVENTION;  Surgeon: Katha Cabal, MD;  Location: [place] CV LAB;  Service: Cardiovascular;  Laterality: Left;    Allergies  Allergen Reactions  . Aspirin Other (See Comments)    High Doses Mess up her stomach; "makes my bowels have blood in them". Takes 81 mg EC Aspirin   . Penicillins Other (See Comments)    SYNCOPE? , "makes me real weak when I take it; like I'll pass out"  Has patient had a PCN reaction causing immediate rash, facial/tongue/throat swelling, SOB or lightheadedness with hypotension: Yes Has patient had a PCN reaction causing severe rash involving mucus membranes or skin necrosis: no Has patient had a PCN reaction that required hospitalization no Has patient had a PCN reaction occurring within the last 10 years: no If all of the above  . Amlodipine Swelling  . Bactrim [Sulfamethoxazole-Trimethoprim] Rash  . Contrast Media [Iodinated Diagnostic Agents] Itching  . Iron Itching and Other (See Comments)    "they gave me iron in dialysis; had to give me Benadryl cause I had to have the iron" (05/02/2012)  . Nitrofurantoin Hives  . Tylenol [Acetaminophen] Itching and Other (See Comments)    Makes her feet on fire per pt  . Gabapentin Other (See  Comments)    Unknown reaction  . Dexilant [Dexlansoprazole] Other (See Comments)    Upset stomach  . Levaquin [Levofloxacin In D5w] Rash  . Morphine And Related Itching and Other (See Comments)    Itching in feet  . Plavix [Clopidogrel Bisulfate] Rash  . Protonix [Pantoprazole Sodium] Rash  . Venofer [Ferric Oxide] Itching and Other (See Comments)    Patient reports using Benadryl prior to doses as [facility]    Current Outpatient Medications on File Prior to Visit  Medication Sig Dispense Refill  . ALPRAZolam (XANAX) 0.25 MG tablet Take 1  tablet (0.25 mg total) by mouth 2 (two) times daily as needed for anxiety or sleep. 10 tablet 0  . amiodarone (PACERONE) 200 MG tablet Take 1 tablet (200 mg total) by mouth daily. 30 tablet 3  . aspirin EC 81 MG tablet Take 81 mg by mouth once. FOR CHEST PAIN    . bisacodyl (DULCOLAX) 10 MG suppository Place 1 suppository (10 mg total) rectally as needed for moderate constipation. 12 suppository 0  . BRILINTA 60 MG TABS tablet TAKE 1 TABLET TWICE DAILY 60 tablet 1  . carvedilol (COREG) 6.25 MG tablet TAKE 1 TABLET BY MOUTH TWICE DAILY WITH  A  MEAL (Patient taking differently: TAKE 1 TABLET (6.25 MG) BY MOUTH TWICE DAILY WITH  A  MEAL) 180 tablet 3  . cinacalcet (SENSIPAR) 30 MG tablet Take 30 mg by mouth daily after supper.    . cloNIDine (CATAPRES) 0.2 MG tablet Take 0.1-0.2 mg by mouth daily as needed (high blood pressure).     . diphenhydrAMINE (BENADRYL) 25 mg capsule Take 50 mg by mouth daily as needed for itching or allergies.     . diphenoxylate-atropine (LOMOTIL) 2.5-0.025 MG tablet Take 1 tablet by mouth 4 (four) times daily as needed for diarrhea or loose stools. 30 tablet 0  . epoetin alfa (EPOGEN,PROCRIT) 02774 UNIT/ML injection Inject 1 mL (10,000 Units total) into the vein every Monday, Wednesday, and Friday with hemodialysis. 1 mL 10  . escitalopram (LEXAPRO) 10 MG tablet     . fluticasone (FLONASE) 50 MCG/ACT nasal spray Place 1  spray at bedtime as needed into both nostrils for allergies.     . hydrALAZINE (APRESOLINE) 50 MG tablet Take 75 mg by mouth 2 (two) times daily.    . isosorbide mononitrate (IMDUR) 120 MG 24 hr tablet TAKE 1 TABLET BY MOUTH ONCE DAILY 30 tablet 6  . lanthanum (FOSRENOL) 1000 MG chewable tablet Chew by mouth.    . lidocaine-prilocaine (EMLA) cream Apply 1 application topically every Monday, Wednesday, and Friday. Prior to dialysis    . loratadine (CLARITIN) 10 MG tablet Take 10 mg by mouth daily as needed for allergies.    . multivitamin (RENA-VIT) TABS tablet Take 1 tablet by mouth daily.    . nitroGLYCERIN (NITROSTAT) 0.4 MG SL tablet Place 1 tablet (0.4 mg total) under the tongue every 5 (five) minutes as needed for chest pain. 25 tablet 3  . omeprazole (PRILOSEC) 20 MG capsule Take 1 capsule (20 mg total) by mouth daily. 90 capsule 3  . ondansetron (ZOFRAN-ODT) 4 MG disintegrating tablet Take 4 mg by mouth every 8 (eight) hours as needed for nausea or vomiting.     . sevelamer carbonate (RENVELA) 800 MG tablet Take 800-2,400 mg by mouth See admin instructions. Take 3 tablets (2400) by mouth twice daily with meals, take 1 tablet (800 mg) with snacks    . simvastatin (ZOCOR) 20 MG tablet TAKE ONE TABLET BY MOUTH AT BEDTIME (Patient taking differently: TAKE ONE TABLET (20 MG) BY MOUTH DAILY AT BEDTIME) 90 tablet 3  . Wheat Dextrin (BENEFIBER DRINK MIX) PACK Take 4 g by mouth at bedtime.    . ferrous sulfate 325 (65 FE) MG EC tablet Take 1 tablet (325 mg total) by mouth 2 (two) times daily with a meal. (Patient taking differently: Take 325 mg by mouth daily with breakfast. ) 60 tablet 0   No current facility-administered medications on file prior to visit.         Objective:  Physical Exam Blood pressure (!) 160/70, pulse 64, temperature 97.9 F (36.6 C), height 5\' 1"  (1.549 m), weight 124 lb 3.2 oz (56.3 kg). Alert and oriented. Skin warm and dry. Oral mucosa is moist.   . Sclera anicteric,  conjunctivae is pink. Thyroid not enlarged. No cervical lymphadenopathy. Lungs clear. Heart regular rate and rhythm.  Abdomen is soft. Bowel sounds are positive. No hepatomegaly. No abdominal masses felt. No tenderness.  No edema to lower extremities.   Rectal exam: no masses. Stool brown and guaiac negative. (I showed Braelin the results of the card which was negative).         Assessment & Plan:  Dark stools. She was guaiac negative in the office today. Will get an H and H. Hx of small bowel angiodysplasia.

## 2018-03-09 DIAGNOSIS — Z992 Dependence on renal dialysis: Secondary | ICD-10-CM | POA: Diagnosis not present

## 2018-03-09 DIAGNOSIS — N186 End stage renal disease: Secondary | ICD-10-CM | POA: Diagnosis not present

## 2018-03-11 DIAGNOSIS — Z992 Dependence on renal dialysis: Secondary | ICD-10-CM | POA: Diagnosis not present

## 2018-03-11 DIAGNOSIS — N186 End stage renal disease: Secondary | ICD-10-CM | POA: Diagnosis not present

## 2018-03-11 DIAGNOSIS — Z23 Encounter for immunization: Secondary | ICD-10-CM | POA: Diagnosis not present

## 2018-03-13 DIAGNOSIS — Z6823 Body mass index (BMI) 23.0-23.9, adult: Secondary | ICD-10-CM | POA: Diagnosis not present

## 2018-03-13 DIAGNOSIS — J159 Unspecified bacterial pneumonia: Secondary | ICD-10-CM | POA: Diagnosis not present

## 2018-03-13 DIAGNOSIS — N186 End stage renal disease: Secondary | ICD-10-CM | POA: Diagnosis not present

## 2018-03-13 DIAGNOSIS — N185 Chronic kidney disease, stage 5: Secondary | ICD-10-CM | POA: Diagnosis not present

## 2018-03-13 DIAGNOSIS — Z992 Dependence on renal dialysis: Secondary | ICD-10-CM | POA: Diagnosis not present

## 2018-03-13 DIAGNOSIS — R5383 Other fatigue: Secondary | ICD-10-CM | POA: Diagnosis not present

## 2018-03-14 ENCOUNTER — Other Ambulatory Visit (INDEPENDENT_AMBULATORY_CARE_PROVIDER_SITE_OTHER): Payer: Self-pay | Admitting: *Deleted

## 2018-03-14 DIAGNOSIS — K922 Gastrointestinal hemorrhage, unspecified: Secondary | ICD-10-CM

## 2018-03-14 DIAGNOSIS — R195 Other fecal abnormalities: Secondary | ICD-10-CM

## 2018-03-19 ENCOUNTER — Other Ambulatory Visit: Payer: Self-pay | Admitting: *Deleted

## 2018-03-19 DIAGNOSIS — D631 Anemia in chronic kidney disease: Secondary | ICD-10-CM | POA: Diagnosis not present

## 2018-03-19 DIAGNOSIS — N2581 Secondary hyperparathyroidism of renal origin: Secondary | ICD-10-CM | POA: Diagnosis not present

## 2018-03-19 MED ORDER — TICAGRELOR 60 MG PO TABS: 60.0000 mg | ORAL_TABLET | Freq: Two times a day (BID) | ORAL | 3 refills | Status: DC

## 2018-03-20 ENCOUNTER — Ambulatory Visit: Payer: Medicare HMO | Admitting: Urology

## 2018-03-20 DIAGNOSIS — D3001 Benign neoplasm of right kidney: Secondary | ICD-10-CM | POA: Diagnosis not present

## 2018-03-23 DIAGNOSIS — K219 Gastro-esophageal reflux disease without esophagitis: Secondary | ICD-10-CM | POA: Diagnosis not present

## 2018-03-23 DIAGNOSIS — E785 Hyperlipidemia, unspecified: Secondary | ICD-10-CM | POA: Diagnosis not present

## 2018-03-23 DIAGNOSIS — J45909 Unspecified asthma, uncomplicated: Secondary | ICD-10-CM | POA: Diagnosis not present

## 2018-03-23 DIAGNOSIS — I4891 Unspecified atrial fibrillation: Secondary | ICD-10-CM | POA: Diagnosis not present

## 2018-03-23 DIAGNOSIS — Z992 Dependence on renal dialysis: Secondary | ICD-10-CM | POA: Diagnosis not present

## 2018-03-23 DIAGNOSIS — R0602 Shortness of breath: Secondary | ICD-10-CM | POA: Diagnosis not present

## 2018-03-23 DIAGNOSIS — R0789 Other chest pain: Secondary | ICD-10-CM | POA: Diagnosis not present

## 2018-03-23 DIAGNOSIS — I255 Ischemic cardiomyopathy: Secondary | ICD-10-CM | POA: Diagnosis not present

## 2018-03-23 DIAGNOSIS — I25708 Atherosclerosis of coronary artery bypass graft(s), unspecified, with other forms of angina pectoris: Secondary | ICD-10-CM | POA: Diagnosis not present

## 2018-03-23 DIAGNOSIS — N186 End stage renal disease: Secondary | ICD-10-CM | POA: Diagnosis not present

## 2018-03-23 DIAGNOSIS — R079 Chest pain, unspecified: Secondary | ICD-10-CM | POA: Diagnosis not present

## 2018-03-23 DIAGNOSIS — I10 Essential (primary) hypertension: Secondary | ICD-10-CM | POA: Diagnosis not present

## 2018-03-23 DIAGNOSIS — I2510 Atherosclerotic heart disease of native coronary artery without angina pectoris: Secondary | ICD-10-CM | POA: Diagnosis not present

## 2018-03-23 DIAGNOSIS — I120 Hypertensive chronic kidney disease with stage 5 chronic kidney disease or end stage renal disease: Secondary | ICD-10-CM | POA: Diagnosis not present

## 2018-03-24 DIAGNOSIS — E785 Hyperlipidemia, unspecified: Secondary | ICD-10-CM | POA: Diagnosis not present

## 2018-03-24 DIAGNOSIS — N186 End stage renal disease: Secondary | ICD-10-CM | POA: Diagnosis not present

## 2018-03-24 DIAGNOSIS — I10 Essential (primary) hypertension: Secondary | ICD-10-CM | POA: Diagnosis not present

## 2018-03-24 DIAGNOSIS — I2510 Atherosclerotic heart disease of native coronary artery without angina pectoris: Secondary | ICD-10-CM | POA: Diagnosis not present

## 2018-03-24 DIAGNOSIS — I4891 Unspecified atrial fibrillation: Secondary | ICD-10-CM | POA: Diagnosis not present

## 2018-03-24 DIAGNOSIS — Z992 Dependence on renal dialysis: Secondary | ICD-10-CM | POA: Diagnosis not present

## 2018-03-24 DIAGNOSIS — K219 Gastro-esophageal reflux disease without esophagitis: Secondary | ICD-10-CM | POA: Diagnosis not present

## 2018-03-24 DIAGNOSIS — J45909 Unspecified asthma, uncomplicated: Secondary | ICD-10-CM | POA: Diagnosis not present

## 2018-03-24 DIAGNOSIS — I120 Hypertensive chronic kidney disease with stage 5 chronic kidney disease or end stage renal disease: Secondary | ICD-10-CM | POA: Diagnosis not present

## 2018-03-24 DIAGNOSIS — R079 Chest pain, unspecified: Secondary | ICD-10-CM | POA: Diagnosis not present

## 2018-03-27 ENCOUNTER — Other Ambulatory Visit: Payer: Self-pay | Admitting: Cardiovascular Disease

## 2018-03-28 ENCOUNTER — Telehealth: Payer: Self-pay | Admitting: Cardiovascular Disease

## 2018-03-28 NOTE — Telephone Encounter (Signed)
Pharmacy intern is calling for [employer] medicare to do medication review. / tg

## 2018-03-29 NOTE — Telephone Encounter (Signed)
Spoke with [employer] and reconciled medication list as we had in chart

## 2018-04-03 ENCOUNTER — Other Ambulatory Visit: Payer: Self-pay | Admitting: Cardiovascular Disease

## 2018-04-04 ENCOUNTER — Encounter (INDEPENDENT_AMBULATORY_CARE_PROVIDER_SITE_OTHER): Payer: Self-pay | Admitting: *Deleted

## 2018-04-04 ENCOUNTER — Other Ambulatory Visit (INDEPENDENT_AMBULATORY_CARE_PROVIDER_SITE_OTHER): Payer: Self-pay | Admitting: *Deleted

## 2018-04-04 DIAGNOSIS — R195 Other fecal abnormalities: Secondary | ICD-10-CM

## 2018-04-04 DIAGNOSIS — K922 Gastrointestinal hemorrhage, unspecified: Secondary | ICD-10-CM

## 2018-04-09 DIAGNOSIS — Z992 Dependence on renal dialysis: Secondary | ICD-10-CM | POA: Diagnosis not present

## 2018-04-09 DIAGNOSIS — E785 Hyperlipidemia, unspecified: Secondary | ICD-10-CM | POA: Diagnosis not present

## 2018-04-09 DIAGNOSIS — Z1159 Encounter for screening for other viral diseases: Secondary | ICD-10-CM | POA: Diagnosis not present

## 2018-04-09 DIAGNOSIS — N186 End stage renal disease: Secondary | ICD-10-CM | POA: Diagnosis not present

## 2018-04-09 DIAGNOSIS — Z79899 Other long term (current) drug therapy: Secondary | ICD-10-CM | POA: Diagnosis not present

## 2018-04-10 ENCOUNTER — Other Ambulatory Visit: Payer: Self-pay | Admitting: Cardiovascular Disease

## 2018-04-10 DIAGNOSIS — Z992 Dependence on renal dialysis: Secondary | ICD-10-CM | POA: Diagnosis not present

## 2018-04-10 DIAGNOSIS — N186 End stage renal disease: Secondary | ICD-10-CM | POA: Diagnosis not present

## 2018-04-11 DIAGNOSIS — N186 End stage renal disease: Secondary | ICD-10-CM | POA: Diagnosis not present

## 2018-04-11 DIAGNOSIS — Z992 Dependence on renal dialysis: Secondary | ICD-10-CM | POA: Diagnosis not present

## 2018-04-13 DIAGNOSIS — Z992 Dependence on renal dialysis: Secondary | ICD-10-CM | POA: Diagnosis not present

## 2018-04-13 DIAGNOSIS — N186 End stage renal disease: Secondary | ICD-10-CM | POA: Diagnosis not present

## 2018-04-16 DIAGNOSIS — N186 End stage renal disease: Secondary | ICD-10-CM | POA: Diagnosis not present

## 2018-04-16 DIAGNOSIS — I10 Essential (primary) hypertension: Secondary | ICD-10-CM | POA: Diagnosis not present

## 2018-04-16 DIAGNOSIS — M109 Gout, unspecified: Secondary | ICD-10-CM | POA: Diagnosis not present

## 2018-04-16 DIAGNOSIS — I120 Hypertensive chronic kidney disease with stage 5 chronic kidney disease or end stage renal disease: Secondary | ICD-10-CM | POA: Diagnosis not present

## 2018-04-16 DIAGNOSIS — T18108A Unspecified foreign body in esophagus causing other injury, initial encounter: Secondary | ICD-10-CM | POA: Diagnosis not present

## 2018-04-16 DIAGNOSIS — D631 Anemia in chronic kidney disease: Secondary | ICD-10-CM | POA: Diagnosis not present

## 2018-04-16 DIAGNOSIS — Z992 Dependence on renal dialysis: Secondary | ICD-10-CM | POA: Diagnosis not present

## 2018-04-16 DIAGNOSIS — Z8543 Personal history of malignant neoplasm of ovary: Secondary | ICD-10-CM | POA: Diagnosis not present

## 2018-04-16 DIAGNOSIS — K219 Gastro-esophageal reflux disease without esophagitis: Secondary | ICD-10-CM | POA: Diagnosis not present

## 2018-04-16 DIAGNOSIS — Z8673 Personal history of transient ischemic attack (TIA), and cerebral infarction without residual deficits: Secondary | ICD-10-CM | POA: Diagnosis not present

## 2018-04-16 DIAGNOSIS — R070 Pain in throat: Secondary | ICD-10-CM | POA: Diagnosis not present

## 2018-04-18 DIAGNOSIS — Z992 Dependence on renal dialysis: Secondary | ICD-10-CM | POA: Diagnosis not present

## 2018-04-18 DIAGNOSIS — N186 End stage renal disease: Secondary | ICD-10-CM | POA: Diagnosis not present

## 2018-04-19 ENCOUNTER — Telehealth: Payer: Self-pay | Admitting: Cardiovascular Disease

## 2018-04-19 NOTE — Telephone Encounter (Signed)
No tax returns-retired Social Security income $1,400/mth from husband and patients is $769/mth.

## 2018-04-19 NOTE — Telephone Encounter (Signed)
Attempted to notify patient that she will need to bring paper documentation to be able to fax to the assistance company.    No answer.

## 2018-04-19 NOTE — Telephone Encounter (Signed)
Patient called wanting to give Shawna [name] information for a paper that she dropped off

## 2018-04-19 NOTE — Telephone Encounter (Signed)
Patient notified and verbalized understanding. 

## 2018-04-20 DIAGNOSIS — N186 End stage renal disease: Secondary | ICD-10-CM | POA: Diagnosis not present

## 2018-04-20 DIAGNOSIS — Z992 Dependence on renal dialysis: Secondary | ICD-10-CM | POA: Diagnosis not present

## 2018-04-23 DIAGNOSIS — N2581 Secondary hyperparathyroidism of renal origin: Secondary | ICD-10-CM | POA: Diagnosis not present

## 2018-04-23 DIAGNOSIS — N186 End stage renal disease: Secondary | ICD-10-CM | POA: Diagnosis not present

## 2018-04-23 DIAGNOSIS — Z992 Dependence on renal dialysis: Secondary | ICD-10-CM | POA: Diagnosis not present

## 2018-04-23 DIAGNOSIS — D631 Anemia in chronic kidney disease: Secondary | ICD-10-CM | POA: Diagnosis not present

## 2018-04-25 DIAGNOSIS — N186 End stage renal disease: Secondary | ICD-10-CM | POA: Diagnosis not present

## 2018-04-25 DIAGNOSIS — Z992 Dependence on renal dialysis: Secondary | ICD-10-CM | POA: Diagnosis not present

## 2018-04-27 DIAGNOSIS — Z992 Dependence on renal dialysis: Secondary | ICD-10-CM | POA: Diagnosis not present

## 2018-04-27 DIAGNOSIS — N186 End stage renal disease: Secondary | ICD-10-CM | POA: Diagnosis not present

## 2018-04-27 DIAGNOSIS — R69 Illness, unspecified: Secondary | ICD-10-CM | POA: Diagnosis not present

## 2018-04-30 DIAGNOSIS — N186 End stage renal disease: Secondary | ICD-10-CM | POA: Diagnosis not present

## 2018-04-30 DIAGNOSIS — Z992 Dependence on renal dialysis: Secondary | ICD-10-CM | POA: Diagnosis not present

## 2018-05-01 DIAGNOSIS — R946 Abnormal results of thyroid function studies: Secondary | ICD-10-CM | POA: Diagnosis not present

## 2018-05-01 DIAGNOSIS — Z6823 Body mass index (BMI) 23.0-23.9, adult: Secondary | ICD-10-CM | POA: Diagnosis not present

## 2018-05-01 DIAGNOSIS — R634 Abnormal weight loss: Secondary | ICD-10-CM | POA: Diagnosis not present

## 2018-05-01 DIAGNOSIS — N185 Chronic kidney disease, stage 5: Secondary | ICD-10-CM | POA: Diagnosis not present

## 2018-05-01 DIAGNOSIS — N19 Unspecified kidney failure: Secondary | ICD-10-CM | POA: Diagnosis not present

## 2018-05-01 DIAGNOSIS — R5383 Other fatigue: Secondary | ICD-10-CM | POA: Diagnosis not present

## 2018-05-02 DIAGNOSIS — N186 End stage renal disease: Secondary | ICD-10-CM | POA: Diagnosis not present

## 2018-05-02 DIAGNOSIS — Z992 Dependence on renal dialysis: Secondary | ICD-10-CM | POA: Diagnosis not present

## 2018-05-03 ENCOUNTER — Ambulatory Visit (INDEPENDENT_AMBULATORY_CARE_PROVIDER_SITE_OTHER): Payer: Medicare HMO | Admitting: Vascular Surgery

## 2018-05-03 ENCOUNTER — Encounter (INDEPENDENT_AMBULATORY_CARE_PROVIDER_SITE_OTHER): Payer: Medicare HMO

## 2018-05-04 DIAGNOSIS — N186 End stage renal disease: Secondary | ICD-10-CM | POA: Diagnosis not present

## 2018-05-04 DIAGNOSIS — Z992 Dependence on renal dialysis: Secondary | ICD-10-CM | POA: Diagnosis not present

## 2018-05-07 ENCOUNTER — Ambulatory Visit: Payer: Medicare HMO | Admitting: Cardiovascular Disease

## 2018-05-07 DIAGNOSIS — N186 End stage renal disease: Secondary | ICD-10-CM | POA: Diagnosis not present

## 2018-05-07 DIAGNOSIS — N2581 Secondary hyperparathyroidism of renal origin: Secondary | ICD-10-CM | POA: Diagnosis not present

## 2018-05-07 DIAGNOSIS — D509 Iron deficiency anemia, unspecified: Secondary | ICD-10-CM | POA: Diagnosis not present

## 2018-05-07 DIAGNOSIS — Z992 Dependence on renal dialysis: Secondary | ICD-10-CM | POA: Diagnosis not present

## 2018-05-09 DIAGNOSIS — N186 End stage renal disease: Secondary | ICD-10-CM | POA: Diagnosis not present

## 2018-05-09 DIAGNOSIS — Z992 Dependence on renal dialysis: Secondary | ICD-10-CM | POA: Diagnosis not present

## 2018-05-10 ENCOUNTER — Ambulatory Visit: Payer: Medicare HMO | Admitting: Cardiovascular Disease

## 2018-05-10 ENCOUNTER — Encounter: Payer: Self-pay | Admitting: Cardiovascular Disease

## 2018-05-10 VITALS — BP 144/62 | HR 69 | Ht 61.0 in | Wt 121.0 lb

## 2018-05-10 DIAGNOSIS — E785 Hyperlipidemia, unspecified: Secondary | ICD-10-CM | POA: Diagnosis not present

## 2018-05-10 DIAGNOSIS — I10 Essential (primary) hypertension: Secondary | ICD-10-CM

## 2018-05-10 DIAGNOSIS — I6523 Occlusion and stenosis of bilateral carotid arteries: Secondary | ICD-10-CM | POA: Diagnosis not present

## 2018-05-10 DIAGNOSIS — I472 Ventricular tachycardia, unspecified: Secondary | ICD-10-CM

## 2018-05-10 DIAGNOSIS — Z992 Dependence on renal dialysis: Secondary | ICD-10-CM | POA: Diagnosis not present

## 2018-05-10 DIAGNOSIS — I25708 Atherosclerosis of coronary artery bypass graft(s), unspecified, with other forms of angina pectoris: Secondary | ICD-10-CM

## 2018-05-10 DIAGNOSIS — N186 End stage renal disease: Secondary | ICD-10-CM | POA: Diagnosis not present

## 2018-05-10 NOTE — Progress Notes (Signed)
SUBJECTIVE: Patient presents for routine follow-up.  She was hospitalized for atypical chest pain in August 2019.  Hydralazine was increased to 50 mg 3 times daily.  She underwent an intermediate risk nuclear stress test on 02/15/2018 which demonstrated scar in the mid to distal inferior and anteroseptal walls.  There was no evidence of ischemia.  Echocardiogram on 02/14/2018 demonstrated normal left ventricular systolic function and regional wall motion, LVEF 55 to 60%, mild LVH, grade 2 diastolic dysfunction with high ventricular filling pressures, mild mitral and moderate tricuspid regurgitation.  There was severe biatrial dilatation.  There was mild right ventricular dilatation with mildly reduced systolic function.  PA pressure 56 mmHg.  She sustained a non-STEMI in April 2018 andunderwent percutaneous coronary intervention to a subtotally occluded diagonal vessel with a drug-eluting stent.She had a patent LIMA graft to the LAD and a patent SVG supplying the ramus intermedius and patent SVG supplying the distal RCA.  She is doing fairly well overall.  She had an episode of chest pain last night which was alleviated with one nitroglycerin tablet.  Her blood pressures continue to fluctuate at home but overall they are reasonably controlled.  She denies palpitations and leg swelling.   Review of Systems: As per "subjective", otherwise negative.  Allergies  Allergen Reactions  . Aspirin Other (See Comments)    High Doses Mess up her stomach; "makes my bowels have blood in them". Takes 81 mg EC Aspirin   . Penicillins Other (See Comments)    SYNCOPE? , "makes me real weak when I take it; like I'll pass out"  Has patient had a PCN reaction causing immediate rash, facial/tongue/throat swelling, SOB or lightheadedness with hypotension: Yes Has patient had a PCN reaction causing severe rash involving mucus membranes or skin necrosis: no Has patient had a PCN reaction that required  hospitalization no Has patient had a PCN reaction occurring within the last 10 years: no If all of the above  . Amlodipine Swelling  . Bactrim [Sulfamethoxazole-Trimethoprim] Rash  . Contrast Media [Iodinated Diagnostic Agents] Itching  . Iron Itching and Other (See Comments)    "they gave me iron in dialysis; had to give me Benadryl cause I had to have the iron" (05/02/2012)  . Nitrofurantoin Hives  . Tylenol [Acetaminophen] Itching and Other (See Comments)    Makes her feet on fire per pt  . Gabapentin Other (See Comments)    Unknown reaction  . Dexilant [Dexlansoprazole] Other (See Comments)    Upset stomach  . Levaquin [Levofloxacin In D5w] Rash  . Morphine And Related Itching and Other (See Comments)    Itching in feet  . Plavix [Clopidogrel Bisulfate] Rash  . Protonix [Pantoprazole Sodium] Rash  . Venofer [Ferric Oxide] Itching and Other (See Comments)    Patient reports using Benadryl prior to doses as [facility]    Current Outpatient Medications  Medication Sig Dispense Refill  . ALPRAZolam (XANAX) 0.25 MG tablet Take 1 tablet (0.25 mg total) by mouth 2 (two) times daily as needed for anxiety or sleep. 10 tablet 0  . amiodarone (PACERONE) 200 MG tablet Take 1 tablet (200 mg total) by mouth daily. 30 tablet 3  . amiodarone (PACERONE) 200 MG tablet TAKE 1 TABLET BY MOUTH ONCE DAILY 90 tablet 1  . aspirin EC 81 MG tablet Take 81 mg by mouth once. FOR CHEST PAIN    . bisacodyl (DULCOLAX) 10 MG suppository Place 1 suppository (10 mg total) rectally as needed  for moderate constipation. 12 suppository 0  . BRILINTA 60 MG TABS tablet TAKE 1 TABLET BY MOUTH TWICE DAILY 60 tablet 6  . carvedilol (COREG) 6.25 MG tablet TAKE 1 TABLET BY MOUTH TWICE DAILY WITH  A  MEAL (Patient taking differently: TAKE 1 TABLET (6.25 MG) BY MOUTH TWICE DAILY WITH  A  MEAL) 180 tablet 3  . cinacalcet (SENSIPAR) 30 MG tablet Take 30 mg by mouth daily after supper.    . cloNIDine (CATAPRES) 0.2 MG  tablet Take 0.1-0.2 mg by mouth daily as needed (high blood pressure).     . diphenhydrAMINE (BENADRYL) 25 mg capsule Take 50 mg by mouth daily as needed for itching or allergies.     . diphenoxylate-atropine (LOMOTIL) 2.5-0.025 MG tablet Take 1 tablet by mouth 4 (four) times daily as needed for diarrhea or loose stools. 30 tablet 0  . epoetin alfa (EPOGEN,PROCRIT) 22025 UNIT/ML injection Inject 1 mL (10,000 Units total) into the vein every Monday, Wednesday, and Friday with hemodialysis. 1 mL 10  . escitalopram (LEXAPRO) 10 MG tablet     . ferrous sulfate 325 (65 FE) MG EC tablet Take 1 tablet (325 mg total) by mouth 2 (two) times daily with a meal. (Patient taking differently: Take 325 mg by mouth daily with breakfast. ) 60 tablet 0  . fluticasone (FLONASE) 50 MCG/ACT nasal spray Place 1 spray at bedtime as needed into both nostrils for allergies.     . hydrALAZINE (APRESOLINE) 50 MG tablet Take 75 mg by mouth 2 (two) times daily.    . isosorbide mononitrate (IMDUR) 120 MG 24 hr tablet TAKE 1 TABLET BY MOUTH ONCE DAILY 30 tablet 6  . lanthanum (FOSRENOL) 1000 MG chewable tablet Chew by mouth.    . lidocaine-prilocaine (EMLA) cream Apply 1 application topically every Monday, Wednesday, and Friday. Prior to dialysis    . loratadine (CLARITIN) 10 MG tablet Take 10 mg by mouth daily as needed for allergies.    . multivitamin (RENA-VIT) TABS tablet Take 1 tablet by mouth daily.    . nitroGLYCERIN (NITROSTAT) 0.4 MG SL tablet Place 1 tablet (0.4 mg total) under the tongue every 5 (five) minutes as needed for chest pain. 25 tablet 3  . omeprazole (PRILOSEC) 20 MG capsule Take 1 capsule (20 mg total) by mouth daily. 90 capsule 3  . ondansetron (ZOFRAN-ODT) 4 MG disintegrating tablet Take 4 mg by mouth every 8 (eight) hours as needed for nausea or vomiting.     . sevelamer carbonate (RENVELA) 800 MG tablet Take 800-2,400 mg by mouth See admin instructions. Take 3 tablets (2400) by mouth twice daily with  meals, take 1 tablet (800 mg) with snacks    . simvastatin (ZOCOR) 20 MG tablet TAKE ONE TABLET (20 MG) BY MOUTH DAILY AT BEDTIME 90 tablet 3  . ticagrelor (BRILINTA) 60 MG TABS tablet Take 1 tablet (60 mg total) by mouth 2 (two) times daily. 180 tablet 3   No current facility-administered medications for this visit.     Past Medical History:  Diagnosis Date  . Acute on chronic respiratory failure with hypoxia ([place]) 10/10/2016  . Anxiety   . Arthritis   . AVM (arteriovenous malformation) of colon   . CAD (coronary artery disease)    a. 12/2011 NSTEMI/Cath/PCI LCX (2.25x14 Resolute DES) & D1 (2.25x22 Resolute DES);  b. 01/2012 Cath/PCI: LM 30, LAD 30p, 40-2m, D1 stent ok, 99 in sm branch of diag, LCX patent stent, OM1 20, RCA 95 ost (4.0x12 Promus  DES), EF 55%;  c. 04/2012 Lexi Cardiolite  EF 48%, small area of scar @ base/mid inflat wall with mild peri-infarct ischemia.; CABG 12/4  . Carotid artery disease ([place])    a. 21-30% LICA, 02/6577   . Chronic bronchitis ([place])   . Chronic diastolic CHF (congestive heart failure) ([place])    a. 02/2012 Echo EF 60-65%, nl wall motion, Gr 1 DD, mod MR  . Colon cancer ([place]) 1992  . Esophageal stricture   . ESRD on hemodialysis ([place])    ESRD due to HTN, started dialysis 2011 and gets HD at [facility] with Dr Hinda Lenis on MWF schedule.  Access is LUA AVF as of Sept 2014.   Marland Kitchen GERD (gastroesophageal reflux disease)   . High cholesterol 12/2011  . History of blood transfusion 07/2011; 12/2011; 01/2012 X 2; 04/2012  . History of gout   . History of lower GI bleeding   . Hypertension   . Iron deficiency anemia   . Mitral regurgitation    a. Moderate by echo, 02/2012  . Myocardial infarction ([place])   . Ovarian cancer ([place]) 1992  . Pneumonia ~ 2009  . PUD (peptic ulcer disease)   . TIA (transient ischemic attack)     Past Surgical History:  Procedure Laterality Date  . ABDOMINAL HYSTERECTOMY  1992  . APPENDECTOMY  06/1990  . AV FISTULA PLACEMENT  07/2009    left upper arm  . AV FISTULA PLACEMENT Right 09/06/2016   Procedure: RIGHT FOREARM ARTERIOVENOUS (AV) GRAFT;  Surgeon: Elam Dutch, MD;  Location: [facility] Main OR;  Service: Vascular;  Laterality: Right;  . AV FISTULA PLACEMENT N/A 02/24/2017   Procedure: INSERTION OF ARTERIOVENOUS (AV) GORE-TEX GRAFT ARM (BRACHIAL AXILLARY);  Surgeon: Katha Cabal, MD;  Location: [facility] ORS;  Service: Vascular;  Laterality: N/A;  . [place] Right 09/06/2016   Procedure: REMOVAL OF Right Arm ARTERIOVENOUS GORETEX GRAFT and Vein Patch angioplasty of brachial artery;  Surgeon: Angelia Mould, MD;  Location: [place];  Service: Vascular;  Laterality: Right;  . COLON RESECTION  1992  . COLON SURGERY    . CORONARY ANGIOPLASTY WITH STENT PLACEMENT  12/15/11   "2"  . CORONARY ANGIOPLASTY WITH STENT PLACEMENT  y/2013   "1; makes total of 3" (05/02/2012)  . CORONARY ARTERY BYPASS GRAFT  06/13/2012   Procedure: CORONARY ARTERY BYPASS GRAFTING (CABG);  Surgeon: Grace Isaac, MD;  Location: [place];  Service: Open Heart Surgery;  Laterality: N/A;  cabg x four;  using left internal mammary artery, and left leg greater saphenous vein harvested endoscopically  . CORONARY STENT INTERVENTION N/A 10/13/2016   Procedure: Coronary Stent Intervention;  Surgeon: Troy Sine, MD;  Location: [place] CV LAB;  Service: Cardiovascular;  Laterality: N/A;  . DIALYSIS/PERMA CATHETER REMOVAL N/A 04/18/2017   Procedure: DIALYSIS/PERMA CATHETER REMOVAL;  Surgeon: Katha Cabal, MD;  Location: [place] CV LAB;  Service: Cardiovascular;  Laterality: N/A;  . DILATION AND CURETTAGE OF UTERUS    . ESOPHAGOGASTRODUODENOSCOPY  01/20/2012   Procedure: ESOPHAGOGASTRODUODENOSCOPY (EGD);  Surgeon: Ladene Artist, MD,FACG;  Location: [facility] ENDOSCOPY;  Service: Endoscopy;  Laterality: N/A;  . ESOPHAGOGASTRODUODENOSCOPY N/A 03/26/2013   Procedure: ESOPHAGOGASTRODUODENOSCOPY (EGD);  Surgeon: Irene Shipper, MD;  Location: [facility] ENDOSCOPY;  Service:  Endoscopy;  Laterality: N/A;  . ESOPHAGOGASTRODUODENOSCOPY N/A 04/30/2015   Procedure: ESOPHAGOGASTRODUODENOSCOPY (EGD);  Surgeon: Rogene Houston, MD;  [facility]O [facility];  Service: Endoscopy;  Laterality: N/A;  1pm - moved to 10/20 @  1:10  . ESOPHAGOGASTRODUODENOSCOPY N/A 07/29/2016   Procedure: ESOPHAGOGASTRODUODENOSCOPY (EGD);  Surgeon: Manus Gunning, MD;  Location: [place];  Service: Gastroenterology;  Laterality: N/A;  enteroscopy  . INTRAOPERATIVE TRANSESOPHAGEAL ECHOCARDIOGRAM  06/13/2012   Procedure: INTRAOPERATIVE TRANSESOPHAGEAL ECHOCARDIOGRAM;  Surgeon: Grace Isaac, MD;  Location: [place];  Service: Open Heart Surgery;  Laterality: N/A;  . IR GENERIC HISTORICAL  07/26/2016   IR FLUORO GUIDE CV LINE RIGHT 07/26/2016 Greggory Keen, MD [facility]-INTERV RAD  . IR GENERIC HISTORICAL  07/26/2016   IR US GUIDE VASC ACCESS RIGHT 07/26/2016 Greggory Keen, MD [facility]  . IR GENERIC HISTORICAL  08/02/2016   IR US GUIDE VASC ACCESS RIGHT 08/02/2016 Greggory Keen, MD [facility]  . IR GENERIC HISTORICAL  08/02/2016   IR FLUORO GUIDE CV LINE RIGHT 08/02/2016 Greggory Keen, MD [facility]  . IR RADIOLOGY PERIPHERAL GUIDED IV START  03/28/2017  . IR US GUIDE VASC ACCESS RIGHT  03/28/2017  . LEFT HEART CATH AND CORONARY ANGIOGRAPHY N/A 09/20/2016   Procedure: Left Heart Cath and Coronary Angiography;  Surgeon: [name], MD;  Location: [place] CV LAB;  Service: Cardiovascular;  Laterality: N/A;  . LEFT HEART CATH AND CORS/GRAFTS ANGIOGRAPHY N/A 10/13/2016   Procedure: Left Heart Cath and Cors/Grafts Angiography;  Surgeon: Troy Sine, MD;  Location: [place] CV LAB;  Service: Cardiovascular;  Laterality: N/A;  . LEFT HEART CATHETERIZATION WITH CORONARY ANGIOGRAM N/A 12/15/2011   Procedure: LEFT HEART CATHETERIZATION WITH CORONARY ANGIOGRAM;  Surgeon: Burnell Blanks, MD;  Location: [facility] CATH LAB;  Service: Cardiovascular;  Laterality: N/A;  . LEFT HEART CATHETERIZATION  WITH CORONARY ANGIOGRAM N/A 01/10/2012   Procedure: LEFT HEART CATHETERIZATION WITH CORONARY ANGIOGRAM;  Surgeon: Peter M Martinique, MD;  Location: [facility] CATH LAB;  Service: Cardiovascular;  Laterality: N/A;  . LEFT HEART CATHETERIZATION WITH CORONARY ANGIOGRAM N/A 06/08/2012   Procedure: LEFT HEART CATHETERIZATION WITH CORONARY ANGIOGRAM;  Surgeon: Burnell Blanks, MD;  Location: [facility] CATH LAB;  Service: Cardiovascular;  Laterality: N/A;  . LEFT HEART CATHETERIZATION WITH CORONARY/GRAFT ANGIOGRAM N/A 12/10/2013   Procedure: LEFT HEART CATHETERIZATION WITH Beatrix Fetters;  Surgeon: [name], MD;  Location: [facility] CATH LAB;  Service: Cardiovascular;  Laterality: N/A;  . OVARY SURGERY     ovarian cancer  . REVISION OF ARTERIOVENOUS GORETEX GRAFT N/A 02/24/2017   Procedure: REVISION OF ARTERIOVENOUS GORETEX GRAFT (RESECTION);  Surgeon: Katha Cabal, MD;  Location: [facility] ORS;  Service: Vascular;  Laterality: N/A;  [place] N/A 10/15/2013   Procedure: Fistulogram;  Surgeon: Serafina Mitchell, MD;  Location: [facility] CATH LAB;  Service: Cardiovascular;  Laterality: N/A;  . THROMBECTOMY / ARTERIOVENOUS GRAFT REVISION  2011   left upper arm  . TUBAL LIGATION  1980's  . UPPER EXTREMITY ANGIOGRAPHY Bilateral 12/06/2016   Procedure: Upper Extremity Angiography;  Surgeon: Katha Cabal, MD;  Location: [place] CV LAB;  Service: Cardiovascular;  Laterality: Bilateral;  . UPPER EXTREMITY INTERVENTION Left 06/06/2017   Procedure: UPPER EXTREMITY INTERVENTION;  Surgeon: Katha Cabal, MD;  Location: [place] CV LAB;  Service: Cardiovascular;  Laterality: Left;    Social History   Socioeconomic History  . Marital status: Married    Spouse name: Not on file  . Number of children: Not on file  . Years of education: Not on file  . Highest education level: Not on file  Occupational History  . Not on file  Social Needs  . Financial resource strain: Not on file  .  Food  insecurity:    Worry: Not on file    Inability: Not on file  . Transportation needs:    Medical: Not on file    Non-medical: Not on file  Tobacco Use  . Smoking status: Never Smoker  . Smokeless tobacco: Never Used  Substance and Sexual Activity  . Alcohol use: No    Alcohol/week: 0.0 standard drinks  . Drug use: No  . Sexual activity: Yes    Birth control/protection: Surgical  Lifestyle  . Physical activity:    Days per week: Not on file    Minutes per session: Not on file  . Stress: Not on file  Relationships  . Social connections:    Talks on phone: Not on file    Gets together: Not on file    Attends religious service: Not on file    Active member of club or organization: Not on file    Attends meetings of clubs or organizations: Not on file    Relationship status: Not on file  . Intimate partner violence:    Fear of current or ex partner: Not on file    Emotionally abused: Not on file    Physically abused: Not on file    Forced sexual activity: Not on file  Other Topics Concern  . Not on file  Social History Narrative   Lives in [place], [place] with husband.  Dialysis pt - [facility].     Vitals:   05/10/18 1249  BP: (!) 144/62  Pulse: 69  SpO2: 97%  Weight: 121 lb (54.9 kg)  Height: 5\' 1"  (1.549 m)    Wt Readings from Last 3 Encounters:  05/10/18 121 lb (54.9 kg)  03/08/18 124 lb 3.2 oz (56.3 kg)  02/14/18 122 lb 5.7 oz (55.5 kg)     PHYSICAL EXAM General: NAD HEENT: Normal. Neck: No JVD, no thyromegaly. Lungs: Clear to auscultation bilaterally with normal respiratory effort. CV: Regular rate and rhythm, normal S1/S2, no S3/S4, no murmur. No pretibial or periankle edema.  No carotid bruit.   Abdomen: Soft, nontender, no distention.  Neurologic: Alert and oriented.  Psych: Normal affect. Skin: Normal. Musculoskeletal: No gross deformities.    ECG: Reviewed above under Subjective   Labs: Lab Results  Component Value Date/Time   K 4.8 02/13/2018 12:08  PM   BUN 48 (H) 02/13/2018 12:08 PM   CREATININE 6.88 (H) 02/13/2018 12:08 PM   ALT 23 08/13/2017 09:54 AM   TSH 1.536 05/19/2016 11:13 AM   TSH 2.864 08/24/2014 10:00 AM   HGB 9.8 (L) 03/08/2018 11:14 AM     Lipids: Lab Results  Component Value Date/Time   LDLCALC 26 10/16/2017 05:39 AM   CHOL 93 10/16/2017 05:39 AM   TRIG 56 10/16/2017 05:39 AM   HDL 56 10/16/2017 05:39 AM       ASSESSMENT AND PLAN:  1. CAD with h/o 4-vessel CABGwith NSTEMI and PCI of diagonal in 10/2016:Symptomatically stable. Coronary angiography detailed above. Continue aspirin, Coreg, nitrates, Brilinta 60 mg twice daily, andsimvastatin.    2. Malignant OHY:WVPXT pressure is only mildly elevated today.  No changes to therapy.  3. Hyperlipidemia:Continuestatin.   4. Carotid artery stenosis:Stable. Continue surveillance monitoring. Continue aspirin and statin.  6.  Paroxysmal ventricular tachycardia:Stable. ContinueCoreg and amiodarone.    I will check TSH and LFTs if not done by PCP.   Disposition: Follow up 6 months   Kate Sable, M.D., F.A.C.C.

## 2018-05-10 NOTE — Patient Instructions (Signed)
Medication Instructions:  Your physician recommends that you continue on your current medications as directed. Please refer to the Current Medication list given to you today.  If you need a refill on your cardiac medications before your next appointment, please call your pharmacy.   Lab work: none If you have labs (blood work) drawn today and your tests are completely normal, you will receive your results only by: . MyChart Message (if you have MyChart) OR . A paper copy in the mail If you have any lab test that is abnormal or we need to change your treatment, we will call you to review the results.  Testing/Procedures: none  Follow-Up: At [facility], you and your health needs are our priority.  As part of our continuing mission to provide you with exceptional heart care, we have created designated Provider Care Teams.  These Care Teams include your primary Cardiologist (physician) and Advanced Practice Providers (APPs -  Physician Assistants and Nurse Practitioners) who all work together to provide you with the care you need, when you need it. You will need a follow up appointment in 6 months.  Please call our office 2 months in advance to schedule this appointment.  You may see Suresh Koneswaran, MDor one of the following Advanced Practice Providers on your designated Care Team:   Brittany Strader, PA-C ([place] Office) . Michele Lenze, PA-C ([place] Office)  Any Other Special Instructions Will Be Listed Below (If Applicable). None   

## 2018-05-11 DIAGNOSIS — Z992 Dependence on renal dialysis: Secondary | ICD-10-CM | POA: Diagnosis not present

## 2018-05-11 DIAGNOSIS — N186 End stage renal disease: Secondary | ICD-10-CM | POA: Diagnosis not present

## 2018-05-14 DIAGNOSIS — N186 End stage renal disease: Secondary | ICD-10-CM | POA: Diagnosis not present

## 2018-05-14 DIAGNOSIS — Z992 Dependence on renal dialysis: Secondary | ICD-10-CM | POA: Diagnosis not present

## 2018-05-15 DIAGNOSIS — Z6823 Body mass index (BMI) 23.0-23.9, adult: Secondary | ICD-10-CM | POA: Diagnosis not present

## 2018-05-15 DIAGNOSIS — N644 Mastodynia: Secondary | ICD-10-CM | POA: Diagnosis not present

## 2018-05-16 DIAGNOSIS — N186 End stage renal disease: Secondary | ICD-10-CM | POA: Diagnosis not present

## 2018-05-16 DIAGNOSIS — Z992 Dependence on renal dialysis: Secondary | ICD-10-CM | POA: Diagnosis not present

## 2018-05-18 DIAGNOSIS — N186 End stage renal disease: Secondary | ICD-10-CM | POA: Diagnosis not present

## 2018-05-18 DIAGNOSIS — Z992 Dependence on renal dialysis: Secondary | ICD-10-CM | POA: Diagnosis not present

## 2018-05-20 DIAGNOSIS — N186 End stage renal disease: Secondary | ICD-10-CM | POA: Diagnosis not present

## 2018-05-20 DIAGNOSIS — Z992 Dependence on renal dialysis: Secondary | ICD-10-CM | POA: Diagnosis not present

## 2018-05-21 DIAGNOSIS — D631 Anemia in chronic kidney disease: Secondary | ICD-10-CM | POA: Diagnosis not present

## 2018-05-21 DIAGNOSIS — Z992 Dependence on renal dialysis: Secondary | ICD-10-CM | POA: Diagnosis not present

## 2018-05-21 DIAGNOSIS — N2581 Secondary hyperparathyroidism of renal origin: Secondary | ICD-10-CM | POA: Diagnosis not present

## 2018-05-21 DIAGNOSIS — N186 End stage renal disease: Secondary | ICD-10-CM | POA: Diagnosis not present

## 2018-05-22 ENCOUNTER — Ambulatory Visit (INDEPENDENT_AMBULATORY_CARE_PROVIDER_SITE_OTHER): Payer: Medicare HMO | Admitting: Internal Medicine

## 2018-05-23 DIAGNOSIS — Z992 Dependence on renal dialysis: Secondary | ICD-10-CM | POA: Diagnosis not present

## 2018-05-23 DIAGNOSIS — N186 End stage renal disease: Secondary | ICD-10-CM | POA: Diagnosis not present

## 2018-05-24 ENCOUNTER — Encounter (INDEPENDENT_AMBULATORY_CARE_PROVIDER_SITE_OTHER): Payer: Self-pay | Admitting: Internal Medicine

## 2018-05-24 ENCOUNTER — Ambulatory Visit (INDEPENDENT_AMBULATORY_CARE_PROVIDER_SITE_OTHER): Payer: Managed Care, Other (non HMO) | Admitting: Internal Medicine

## 2018-05-24 ENCOUNTER — Ambulatory Visit (INDEPENDENT_AMBULATORY_CARE_PROVIDER_SITE_OTHER): Payer: Medicare HMO | Admitting: Internal Medicine

## 2018-05-25 DIAGNOSIS — N186 End stage renal disease: Secondary | ICD-10-CM | POA: Diagnosis not present

## 2018-05-25 DIAGNOSIS — Z992 Dependence on renal dialysis: Secondary | ICD-10-CM | POA: Diagnosis not present

## 2018-05-28 DIAGNOSIS — Z992 Dependence on renal dialysis: Secondary | ICD-10-CM | POA: Diagnosis not present

## 2018-05-28 DIAGNOSIS — N186 End stage renal disease: Secondary | ICD-10-CM | POA: Diagnosis not present

## 2018-05-30 DIAGNOSIS — Z992 Dependence on renal dialysis: Secondary | ICD-10-CM | POA: Diagnosis not present

## 2018-05-30 DIAGNOSIS — N186 End stage renal disease: Secondary | ICD-10-CM | POA: Diagnosis not present

## 2018-06-01 DIAGNOSIS — Z992 Dependence on renal dialysis: Secondary | ICD-10-CM | POA: Diagnosis not present

## 2018-06-01 DIAGNOSIS — N186 End stage renal disease: Secondary | ICD-10-CM | POA: Diagnosis not present

## 2018-06-04 DIAGNOSIS — D509 Iron deficiency anemia, unspecified: Secondary | ICD-10-CM | POA: Diagnosis not present

## 2018-06-04 DIAGNOSIS — N186 End stage renal disease: Secondary | ICD-10-CM | POA: Diagnosis not present

## 2018-06-04 DIAGNOSIS — Z992 Dependence on renal dialysis: Secondary | ICD-10-CM | POA: Diagnosis not present

## 2018-06-04 DIAGNOSIS — N2581 Secondary hyperparathyroidism of renal origin: Secondary | ICD-10-CM | POA: Diagnosis not present

## 2018-06-06 DIAGNOSIS — N186 End stage renal disease: Secondary | ICD-10-CM | POA: Diagnosis not present

## 2018-06-06 DIAGNOSIS — Z992 Dependence on renal dialysis: Secondary | ICD-10-CM | POA: Diagnosis not present

## 2018-06-08 DIAGNOSIS — N186 End stage renal disease: Secondary | ICD-10-CM | POA: Diagnosis not present

## 2018-06-08 DIAGNOSIS — Z992 Dependence on renal dialysis: Secondary | ICD-10-CM | POA: Diagnosis not present

## 2018-06-09 DIAGNOSIS — Z992 Dependence on renal dialysis: Secondary | ICD-10-CM | POA: Diagnosis not present

## 2018-06-09 DIAGNOSIS — N186 End stage renal disease: Secondary | ICD-10-CM | POA: Diagnosis not present

## 2018-06-10 DIAGNOSIS — Z992 Dependence on renal dialysis: Secondary | ICD-10-CM | POA: Diagnosis not present

## 2018-06-10 DIAGNOSIS — N186 End stage renal disease: Secondary | ICD-10-CM | POA: Diagnosis not present

## 2018-06-11 DIAGNOSIS — N186 End stage renal disease: Secondary | ICD-10-CM | POA: Diagnosis not present

## 2018-06-11 DIAGNOSIS — Z992 Dependence on renal dialysis: Secondary | ICD-10-CM | POA: Diagnosis not present

## 2018-06-13 DIAGNOSIS — N186 End stage renal disease: Secondary | ICD-10-CM | POA: Diagnosis not present

## 2018-06-13 DIAGNOSIS — Z992 Dependence on renal dialysis: Secondary | ICD-10-CM | POA: Diagnosis not present

## 2018-06-14 DIAGNOSIS — H6122 Impacted cerumen, left ear: Secondary | ICD-10-CM | POA: Diagnosis not present

## 2018-06-15 DIAGNOSIS — Z0001 Encounter for general adult medical examination with abnormal findings: Secondary | ICD-10-CM | POA: Diagnosis not present

## 2018-06-15 DIAGNOSIS — N186 End stage renal disease: Secondary | ICD-10-CM | POA: Diagnosis not present

## 2018-06-15 DIAGNOSIS — I959 Hypotension, unspecified: Secondary | ICD-10-CM | POA: Diagnosis not present

## 2018-06-15 DIAGNOSIS — Z992 Dependence on renal dialysis: Secondary | ICD-10-CM | POA: Diagnosis not present

## 2018-06-15 DIAGNOSIS — Z6823 Body mass index (BMI) 23.0-23.9, adult: Secondary | ICD-10-CM | POA: Diagnosis not present

## 2018-06-15 DIAGNOSIS — I10 Essential (primary) hypertension: Secondary | ICD-10-CM | POA: Diagnosis not present

## 2018-06-15 DIAGNOSIS — Z79899 Other long term (current) drug therapy: Secondary | ICD-10-CM | POA: Diagnosis not present

## 2018-06-15 DIAGNOSIS — I120 Hypertensive chronic kidney disease with stage 5 chronic kidney disease or end stage renal disease: Secondary | ICD-10-CM | POA: Diagnosis not present

## 2018-06-18 DIAGNOSIS — N186 End stage renal disease: Secondary | ICD-10-CM | POA: Diagnosis not present

## 2018-06-18 DIAGNOSIS — N2581 Secondary hyperparathyroidism of renal origin: Secondary | ICD-10-CM | POA: Diagnosis not present

## 2018-06-18 DIAGNOSIS — Z992 Dependence on renal dialysis: Secondary | ICD-10-CM | POA: Diagnosis not present

## 2018-06-18 DIAGNOSIS — D631 Anemia in chronic kidney disease: Secondary | ICD-10-CM | POA: Diagnosis not present

## 2018-06-19 DIAGNOSIS — Z992 Dependence on renal dialysis: Secondary | ICD-10-CM | POA: Diagnosis not present

## 2018-06-19 DIAGNOSIS — N186 End stage renal disease: Secondary | ICD-10-CM | POA: Diagnosis not present

## 2018-06-20 DIAGNOSIS — N186 End stage renal disease: Secondary | ICD-10-CM | POA: Diagnosis not present

## 2018-06-20 DIAGNOSIS — Z992 Dependence on renal dialysis: Secondary | ICD-10-CM | POA: Diagnosis not present

## 2018-06-22 DIAGNOSIS — Z992 Dependence on renal dialysis: Secondary | ICD-10-CM | POA: Diagnosis not present

## 2018-06-22 DIAGNOSIS — N186 End stage renal disease: Secondary | ICD-10-CM | POA: Diagnosis not present

## 2018-06-25 DIAGNOSIS — N186 End stage renal disease: Secondary | ICD-10-CM | POA: Diagnosis not present

## 2018-06-25 DIAGNOSIS — Z992 Dependence on renal dialysis: Secondary | ICD-10-CM | POA: Diagnosis not present

## 2018-06-26 ENCOUNTER — Telehealth: Payer: Self-pay | Admitting: Cardiovascular Disease

## 2018-06-26 NOTE — Telephone Encounter (Signed)
Patient thinks amiodarone is causing her to be weak, lose weight, and causing her hair to fall out. Patient said she has noticed that her hair started thinning for past 5 months, the length of time she started amiodarone. Patient advised the only way to determine if the medication is causing these problems is to come off of it to see if her symptoms improve. Patient advised that message would be sent to her provider for advice. Verbalized understanding.

## 2018-06-26 NOTE — Telephone Encounter (Signed)
Patient called stating that she is losing weight and now her hair is falling out.

## 2018-06-26 NOTE — Telephone Encounter (Signed)
Patient informed and verbalized understanding of plan. 

## 2018-06-26 NOTE — Telephone Encounter (Signed)
It would be fine to stop amiodarone to see if this is the cause of the problem.

## 2018-06-27 DIAGNOSIS — Z992 Dependence on renal dialysis: Secondary | ICD-10-CM | POA: Diagnosis not present

## 2018-06-27 DIAGNOSIS — N186 End stage renal disease: Secondary | ICD-10-CM | POA: Diagnosis not present

## 2018-06-28 ENCOUNTER — Other Ambulatory Visit (INDEPENDENT_AMBULATORY_CARE_PROVIDER_SITE_OTHER): Payer: Self-pay | Admitting: Internal Medicine

## 2018-06-29 DIAGNOSIS — Z992 Dependence on renal dialysis: Secondary | ICD-10-CM | POA: Diagnosis not present

## 2018-06-29 DIAGNOSIS — N186 End stage renal disease: Secondary | ICD-10-CM | POA: Diagnosis not present

## 2018-07-01 DIAGNOSIS — Z992 Dependence on renal dialysis: Secondary | ICD-10-CM | POA: Diagnosis not present

## 2018-07-01 DIAGNOSIS — N186 End stage renal disease: Secondary | ICD-10-CM | POA: Diagnosis not present

## 2018-07-02 DIAGNOSIS — J0100 Acute maxillary sinusitis, unspecified: Secondary | ICD-10-CM | POA: Diagnosis not present

## 2018-07-02 DIAGNOSIS — Z6824 Body mass index (BMI) 24.0-24.9, adult: Secondary | ICD-10-CM | POA: Diagnosis not present

## 2018-07-03 DIAGNOSIS — Z992 Dependence on renal dialysis: Secondary | ICD-10-CM | POA: Diagnosis not present

## 2018-07-03 DIAGNOSIS — N186 End stage renal disease: Secondary | ICD-10-CM | POA: Diagnosis not present

## 2018-07-05 ENCOUNTER — Other Ambulatory Visit: Payer: Self-pay | Admitting: Cardiovascular Disease

## 2018-07-06 DIAGNOSIS — D509 Iron deficiency anemia, unspecified: Secondary | ICD-10-CM | POA: Diagnosis not present

## 2018-07-06 DIAGNOSIS — N186 End stage renal disease: Secondary | ICD-10-CM | POA: Diagnosis not present

## 2018-07-06 DIAGNOSIS — Z992 Dependence on renal dialysis: Secondary | ICD-10-CM | POA: Diagnosis not present

## 2018-07-09 DIAGNOSIS — Z992 Dependence on renal dialysis: Secondary | ICD-10-CM | POA: Diagnosis not present

## 2018-07-09 DIAGNOSIS — N186 End stage renal disease: Secondary | ICD-10-CM | POA: Diagnosis not present

## 2018-07-10 DIAGNOSIS — Z992 Dependence on renal dialysis: Secondary | ICD-10-CM | POA: Diagnosis not present

## 2018-07-10 DIAGNOSIS — N186 End stage renal disease: Secondary | ICD-10-CM | POA: Diagnosis not present

## 2018-07-11 DIAGNOSIS — Z992 Dependence on renal dialysis: Secondary | ICD-10-CM | POA: Diagnosis not present

## 2018-07-11 DIAGNOSIS — N186 End stage renal disease: Secondary | ICD-10-CM | POA: Diagnosis not present

## 2018-07-12 DIAGNOSIS — I739 Peripheral vascular disease, unspecified: Secondary | ICD-10-CM | POA: Diagnosis not present

## 2018-07-12 DIAGNOSIS — R0602 Shortness of breath: Secondary | ICD-10-CM | POA: Diagnosis not present

## 2018-07-12 DIAGNOSIS — R079 Chest pain, unspecified: Secondary | ICD-10-CM | POA: Diagnosis not present

## 2018-07-12 DIAGNOSIS — R110 Nausea: Secondary | ICD-10-CM | POA: Diagnosis not present

## 2018-07-13 ENCOUNTER — Encounter (HOSPITAL_COMMUNITY)
Admission: EM | Disposition: A | Discharge status: Home / Self Care | Payer: Self-pay | Source: Home / Self Care | Attending: Emergency Medicine

## 2018-07-13 ENCOUNTER — Other Ambulatory Visit: Payer: Self-pay

## 2018-07-13 ENCOUNTER — Emergency Department (HOSPITAL_COMMUNITY): Payer: Medicare HMO

## 2018-07-13 ENCOUNTER — Encounter (HOSPITAL_COMMUNITY): Payer: Self-pay

## 2018-07-13 ENCOUNTER — Observation Stay (HOSPITAL_COMMUNITY)
Admission: EM | Admit: 2018-07-13 | Discharge: 2018-07-14 | Disposition: A | Discharge status: Home / Self Care | Payer: Medicare HMO | Attending: Internal Medicine | Admitting: Internal Medicine

## 2018-07-13 DIAGNOSIS — Z7982 Long term (current) use of aspirin: Secondary | ICD-10-CM | POA: Diagnosis not present

## 2018-07-13 DIAGNOSIS — K219 Gastro-esophageal reflux disease without esophagitis: Secondary | ICD-10-CM | POA: Diagnosis not present

## 2018-07-13 DIAGNOSIS — Z8249 Family history of ischemic heart disease and other diseases of the circulatory system: Secondary | ICD-10-CM | POA: Insufficient documentation

## 2018-07-13 DIAGNOSIS — I2510 Atherosclerotic heart disease of native coronary artery without angina pectoris: Secondary | ICD-10-CM | POA: Diagnosis not present

## 2018-07-13 DIAGNOSIS — M6281 Muscle weakness (generalized): Secondary | ICD-10-CM | POA: Diagnosis not present

## 2018-07-13 DIAGNOSIS — I10 Essential (primary) hypertension: Secondary | ICD-10-CM

## 2018-07-13 DIAGNOSIS — M1990 Unspecified osteoarthritis, unspecified site: Secondary | ICD-10-CM | POA: Insufficient documentation

## 2018-07-13 DIAGNOSIS — Z880 Allergy status to penicillin: Secondary | ICD-10-CM | POA: Diagnosis not present

## 2018-07-13 DIAGNOSIS — N2581 Secondary hyperparathyroidism of renal origin: Secondary | ICD-10-CM | POA: Diagnosis not present

## 2018-07-13 DIAGNOSIS — Z7902 Long term (current) use of antithrombotics/antiplatelets: Secondary | ICD-10-CM | POA: Diagnosis not present

## 2018-07-13 DIAGNOSIS — N186 End stage renal disease: Secondary | ICD-10-CM | POA: Diagnosis not present

## 2018-07-13 DIAGNOSIS — Z79899 Other long term (current) drug therapy: Secondary | ICD-10-CM | POA: Diagnosis not present

## 2018-07-13 DIAGNOSIS — Z955 Presence of coronary angioplasty implant and graft: Secondary | ICD-10-CM | POA: Insufficient documentation

## 2018-07-13 DIAGNOSIS — Z8673 Personal history of transient ischemic attack (TIA), and cerebral infarction without residual deficits: Secondary | ICD-10-CM | POA: Insufficient documentation

## 2018-07-13 DIAGNOSIS — Z90710 Acquired absence of both cervix and uterus: Secondary | ICD-10-CM | POA: Insufficient documentation

## 2018-07-13 DIAGNOSIS — I25110 Atherosclerotic heart disease of native coronary artery with unstable angina pectoris: Principal | ICD-10-CM | POA: Insufficient documentation

## 2018-07-13 DIAGNOSIS — D631 Anemia in chronic kidney disease: Secondary | ICD-10-CM | POA: Diagnosis not present

## 2018-07-13 DIAGNOSIS — I2584 Coronary atherosclerosis due to calcified coronary lesion: Secondary | ICD-10-CM | POA: Insufficient documentation

## 2018-07-13 DIAGNOSIS — Z888 Allergy status to other drugs, medicaments and biological substances status: Secondary | ICD-10-CM | POA: Diagnosis not present

## 2018-07-13 DIAGNOSIS — Z951 Presence of aortocoronary bypass graft: Secondary | ICD-10-CM

## 2018-07-13 DIAGNOSIS — Z992 Dependence on renal dialysis: Secondary | ICD-10-CM | POA: Diagnosis not present

## 2018-07-13 DIAGNOSIS — Z886 Allergy status to analgesic agent status: Secondary | ICD-10-CM | POA: Diagnosis not present

## 2018-07-13 DIAGNOSIS — N189 Chronic kidney disease, unspecified: Secondary | ICD-10-CM

## 2018-07-13 DIAGNOSIS — M109 Gout, unspecified: Secondary | ICD-10-CM | POA: Diagnosis not present

## 2018-07-13 DIAGNOSIS — I5032 Chronic diastolic (congestive) heart failure: Secondary | ICD-10-CM | POA: Diagnosis present

## 2018-07-13 DIAGNOSIS — I5022 Chronic systolic (congestive) heart failure: Secondary | ICD-10-CM | POA: Diagnosis present

## 2018-07-13 DIAGNOSIS — I5042 Chronic combined systolic (congestive) and diastolic (congestive) heart failure: Secondary | ICD-10-CM | POA: Diagnosis present

## 2018-07-13 DIAGNOSIS — R079 Chest pain, unspecified: Secondary | ICD-10-CM | POA: Diagnosis present

## 2018-07-13 DIAGNOSIS — E785 Hyperlipidemia, unspecified: Secondary | ICD-10-CM | POA: Diagnosis not present

## 2018-07-13 DIAGNOSIS — I120 Hypertensive chronic kidney disease with stage 5 chronic kidney disease or end stage renal disease: Secondary | ICD-10-CM | POA: Diagnosis not present

## 2018-07-13 DIAGNOSIS — Z885 Allergy status to narcotic agent status: Secondary | ICD-10-CM | POA: Insufficient documentation

## 2018-07-13 DIAGNOSIS — Z881 Allergy status to other antibiotic agents status: Secondary | ICD-10-CM | POA: Insufficient documentation

## 2018-07-13 DIAGNOSIS — I160 Hypertensive urgency: Secondary | ICD-10-CM | POA: Diagnosis not present

## 2018-07-13 DIAGNOSIS — R110 Nausea: Secondary | ICD-10-CM | POA: Diagnosis not present

## 2018-07-13 DIAGNOSIS — R0602 Shortness of breath: Secondary | ICD-10-CM | POA: Diagnosis not present

## 2018-07-13 DIAGNOSIS — I200 Unstable angina: Secondary | ICD-10-CM | POA: Diagnosis present

## 2018-07-13 DIAGNOSIS — I252 Old myocardial infarction: Secondary | ICD-10-CM

## 2018-07-13 DIAGNOSIS — I132 Hypertensive heart and chronic kidney disease with heart failure and with stage 5 chronic kidney disease, or end stage renal disease: Secondary | ICD-10-CM | POA: Insufficient documentation

## 2018-07-13 HISTORY — PX: LEFT HEART CATH AND CORS/GRAFTS ANGIOGRAPHY: CATH118250

## 2018-07-13 LAB — CBC WITH DIFFERENTIAL/PLATELET
Abs Immature Granulocytes: 0.02 10*3/uL (ref 0.00–0.07)
Basophils Absolute: 0.1 10*3/uL (ref 0.0–0.1)
Basophils Relative: 1 %
Eosinophils Absolute: 0.2 10*3/uL (ref 0.0–0.5)
Eosinophils Relative: 3 %
HCT: 36.6 % (ref 36.0–46.0)
HEMOGLOBIN: 11.5 g/dL — AB (ref 12.0–15.0)
Immature Granulocytes: 0 %
LYMPHS ABS: 0.8 10*3/uL (ref 0.7–4.0)
LYMPHS PCT: 11 %
MCH: 34.8 pg — ABNORMAL HIGH (ref 26.0–34.0)
MCHC: 31.4 g/dL (ref 30.0–36.0)
MCV: 110.9 fL — ABNORMAL HIGH (ref 80.0–100.0)
MONOS PCT: 8 %
Monocytes Absolute: 0.5 10*3/uL (ref 0.1–1.0)
Neutro Abs: 5.1 10*3/uL (ref 1.7–7.7)
Neutrophils Relative %: 77 %
Platelets: 155 10*3/uL (ref 150–400)
RBC: 3.3 MIL/uL — ABNORMAL LOW (ref 3.87–5.11)
RDW: 14.3 % (ref 11.5–15.5)
WBC: 6.7 10*3/uL (ref 4.0–10.5)
nRBC: 0 % (ref 0.0–0.2)

## 2018-07-13 LAB — TROPONIN I
Troponin I: 0.05 ng/mL (ref <0.03)
Troponin I: 0.06 ng/mL (ref <0.03)

## 2018-07-13 LAB — BASIC METABOLIC PANEL
Anion gap: 12 (ref 5–15)
BUN: 55 mg/dL — ABNORMAL HIGH (ref 8–23)
CHLORIDE: 101 mmol/L (ref 98–111)
CO2: 29 mmol/L (ref 22–32)
Calcium: 8.7 mg/dL — ABNORMAL LOW (ref 8.9–10.3)
Creatinine, Ser: 7.98 mg/dL — ABNORMAL HIGH (ref 0.44–1.00)
GFR calc Af Amer: 5 mL/min — ABNORMAL LOW (ref >60)
GFR calc non Af Amer: 4 mL/min — ABNORMAL LOW (ref >60)
Glucose, Bld: 81 mg/dL (ref 70–99)
Potassium: 3.8 mmol/L (ref 3.5–5.1)
Sodium: 142 mmol/L (ref 135–145)

## 2018-07-13 IMAGING — DX DG CHEST 2V
2 series · 2 of 2 positions shown · non-contrast
Comparison: [DATE]

CLINICAL DATA: Chest pain

EXAM:
CHEST - 2 VIEW

[chest lat]
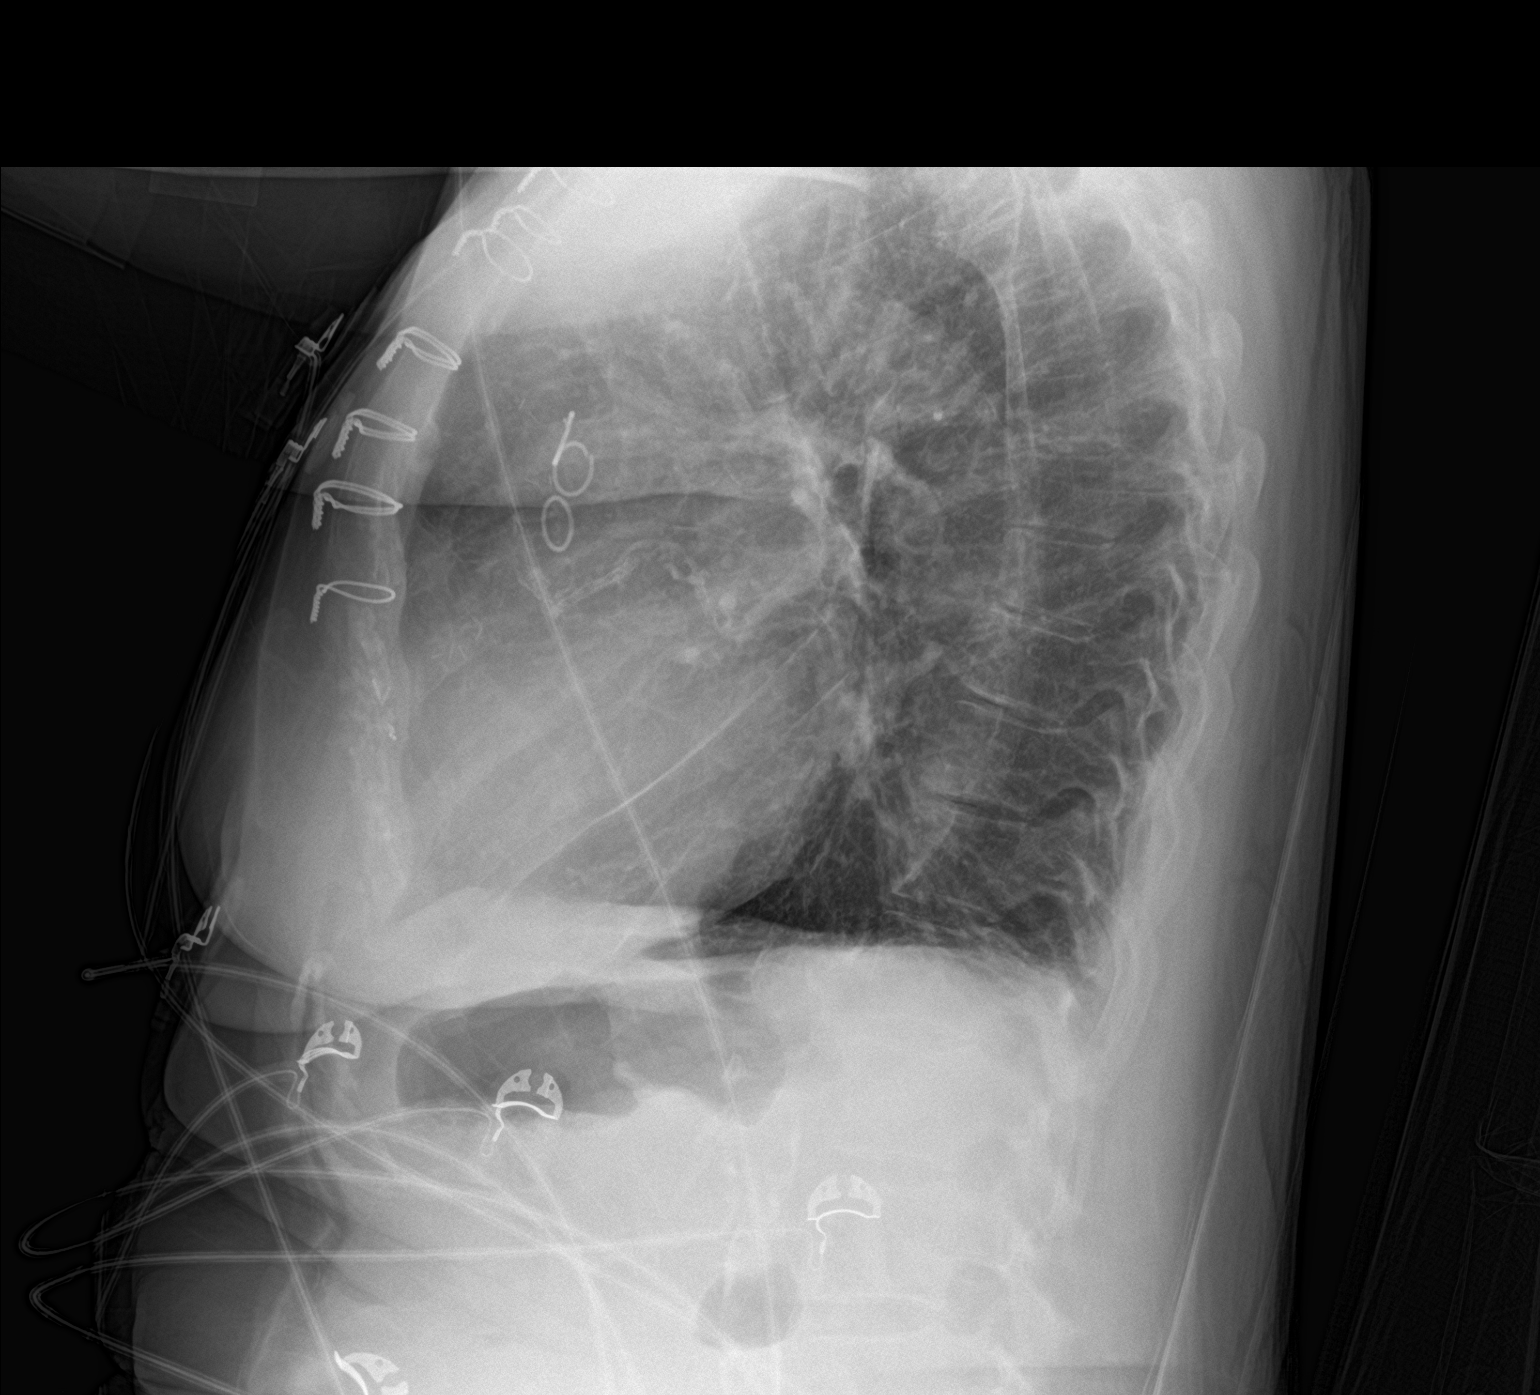

[chest ap]
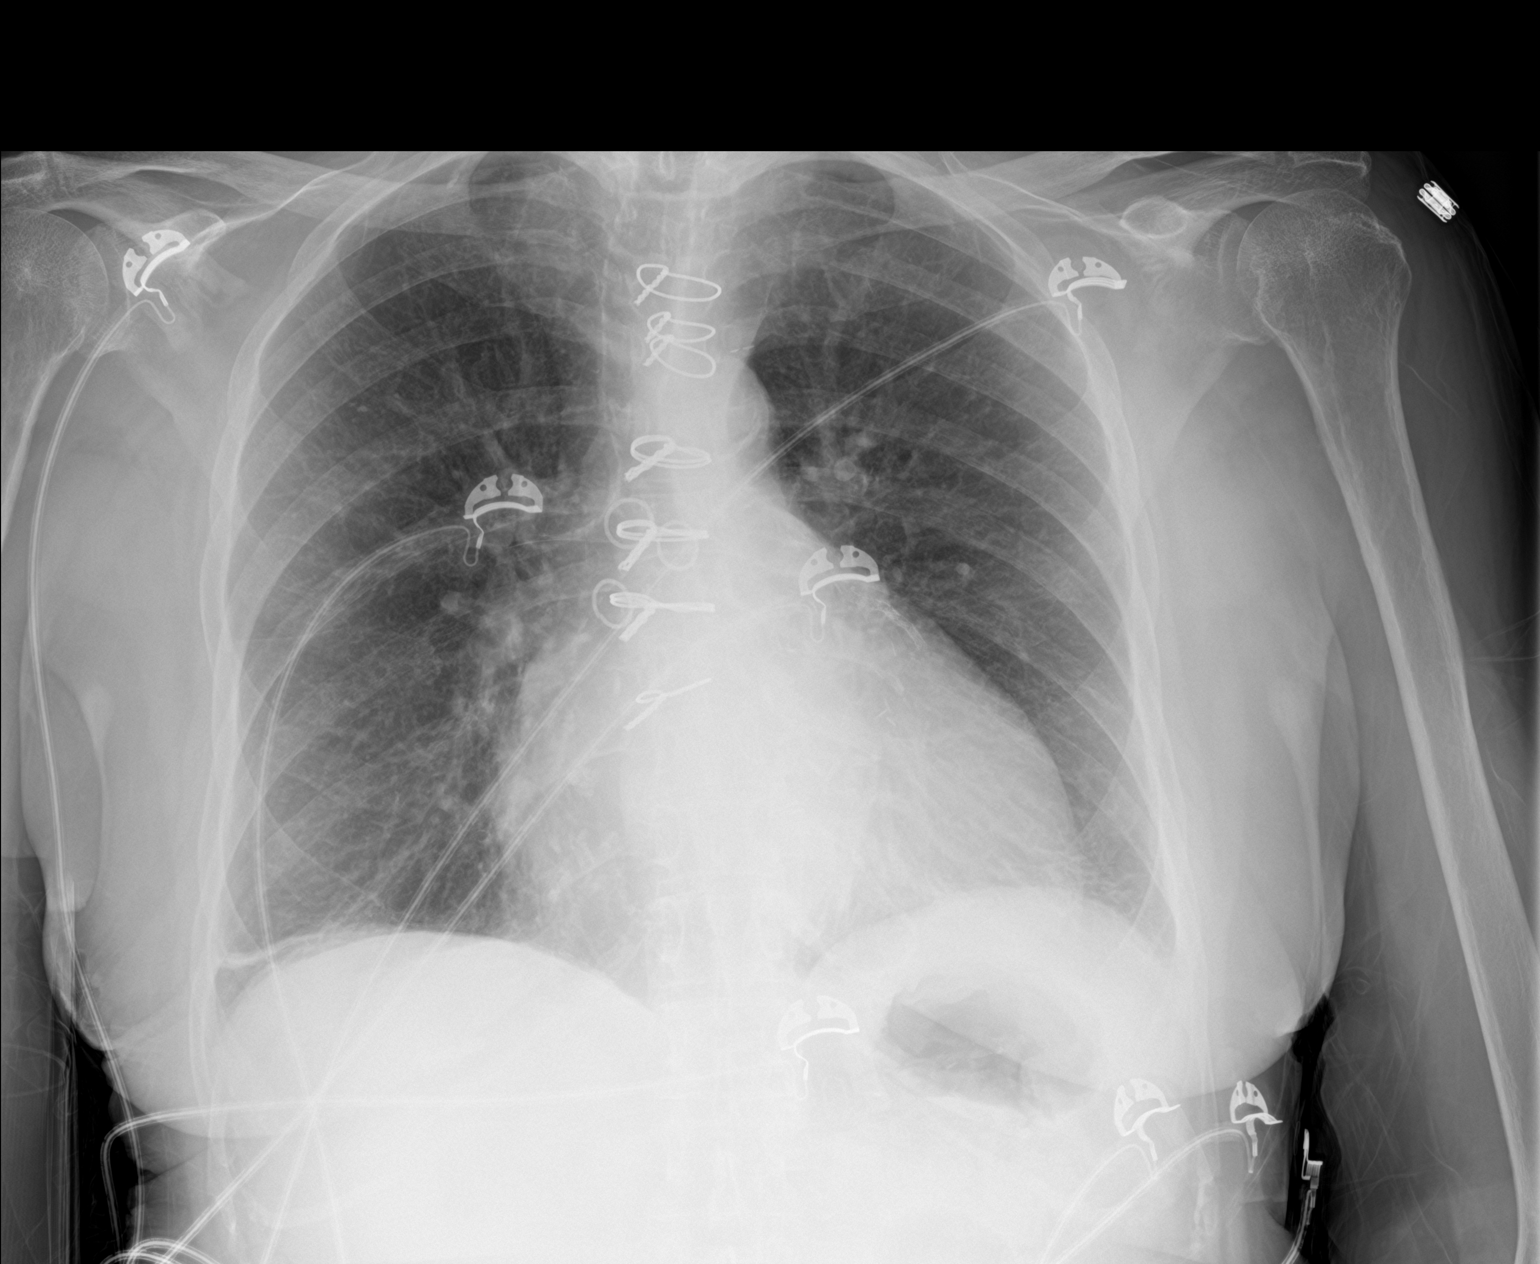

[2 of 2 positions shown; findings below may reference images not displayed]

FINDINGS: Pain mild cardiomegaly with prior CABG. Right-greater-than-left
basilar atelectasis. No focal airspace consolidation or pulmonary
edema.
IMPRESSION: No active cardiopulmonary disease.

## 2018-07-13 SURGERY — LEFT HEART CATH AND CORS/GRAFTS ANGIOGRAPHY
Anesthesia: LOCAL

## 2018-07-13 MED ORDER — MIDAZOLAM HCL 2 MG/2ML IJ SOLN
INTRAMUSCULAR | Status: DC | PRN
  Administered 2018-07-13: 1 mg via INTRAVENOUS

## 2018-07-13 MED ORDER — SEVELAMER CARBONATE 800 MG PO TABS
800.0000 mg | ORAL_TABLET | ORAL | Status: DC
  Administered 2018-07-14: 800 mg via ORAL
  Filled 2018-07-13 (×12): qty 1

## 2018-07-13 MED ORDER — BISACODYL 10 MG RE SUPP: 10.0000 mg | RECTAL | Status: DC | PRN

## 2018-07-13 MED ORDER — NITROGLYCERIN 0.4 MG SL SUBL: 0.4000 mg | SUBLINGUAL_TABLET | SUBLINGUAL | Status: DC | PRN

## 2018-07-13 MED ORDER — HYDRALAZINE HCL 50 MG PO TABS
75.0000 mg | ORAL_TABLET | Freq: Two times a day (BID) | ORAL | Status: DC
  Administered 2018-07-13 (×2): 75 mg via ORAL
  Filled 2018-07-13 (×2): qty 1
  Filled 2018-07-13 (×2): qty 3

## 2018-07-13 MED ORDER — FENTANYL CITRATE (PF) 100 MCG/2ML IJ SOLN
INTRAMUSCULAR | Status: DC | PRN
  Administered 2018-07-13: 25 ug via INTRAVENOUS

## 2018-07-13 MED ORDER — ALPRAZOLAM 0.25 MG PO TABS: 0.2500 mg | ORAL_TABLET | Freq: Two times a day (BID) | ORAL | Status: DC | PRN

## 2018-07-13 MED ORDER — CARVEDILOL 12.5 MG PO TABS: 12.5000 mg | ORAL_TABLET | Freq: Two times a day (BID) | ORAL | Status: DC

## 2018-07-13 MED ORDER — HEPARIN SODIUM (PORCINE) 5000 UNIT/ML IJ SOLN
5000.0000 [IU] | Freq: Three times a day (TID) | INTRAMUSCULAR | Status: DC
  Filled 2018-07-13: qty 1

## 2018-07-13 MED ORDER — ONDANSETRON HCL 4 MG/2ML IJ SOLN: 4.0000 mg | Freq: Four times a day (QID) | INTRAMUSCULAR | Status: DC | PRN

## 2018-07-13 MED ORDER — ISOSORBIDE MONONITRATE ER 60 MG PO TB24
120.0000 mg | ORAL_TABLET | Freq: Every day | ORAL | Status: DC
  Administered 2018-07-13 – 2018-07-14 (×2): 120 mg via ORAL
  Filled 2018-07-13 (×6): qty 2

## 2018-07-13 MED ORDER — HYDRALAZINE HCL 20 MG/ML IJ SOLN: 10.0000 mg | Freq: Four times a day (QID) | INTRAMUSCULAR | Status: DC | PRN

## 2018-07-13 MED ORDER — PANTOPRAZOLE SODIUM 40 MG PO TBEC
40.0000 mg | DELAYED_RELEASE_TABLET | Freq: Every day | ORAL | Status: DC
  Administered 2018-07-13 – 2018-07-14 (×2): 40 mg via ORAL
  Filled 2018-07-13 (×3): qty 1

## 2018-07-13 MED ORDER — SODIUM CHLORIDE 0.9% FLUSH: 3.0000 mL | INTRAVENOUS | Status: DC | PRN

## 2018-07-13 MED ORDER — LIDOCAINE HCL (PF) 1 % IJ SOLN
INTRAMUSCULAR | Status: DC | PRN
  Administered 2018-07-13: 15 mL

## 2018-07-13 MED ORDER — LIDOCAINE HCL (PF) 1 % IJ SOLN
INTRAMUSCULAR | Status: AC
  Filled 2018-07-13: qty 30

## 2018-07-13 MED ORDER — FENTANYL CITRATE (PF) 100 MCG/2ML IJ SOLN
INTRAMUSCULAR | Status: AC
  Filled 2018-07-13: qty 2

## 2018-07-13 MED ORDER — HEPARIN SODIUM (PORCINE) 5000 UNIT/ML IJ SOLN
5000.0000 [IU] | Freq: Three times a day (TID) | INTRAMUSCULAR | Status: DC
  Administered 2018-07-14: 5000 [IU] via SUBCUTANEOUS
  Filled 2018-07-13 (×2): qty 1

## 2018-07-13 MED ORDER — METHYLPREDNISOLONE SODIUM SUCC 40 MG IJ SOLR: 40.0000 mg | INTRAMUSCULAR | Status: DC

## 2018-07-13 MED ORDER — HYDRALAZINE HCL 20 MG/ML IJ SOLN
INTRAMUSCULAR | Status: DC | PRN
  Administered 2018-07-13: 10 mg via INTRAVENOUS

## 2018-07-13 MED ORDER — CARVEDILOL 6.25 MG PO TABS
6.2500 mg | ORAL_TABLET | Freq: Two times a day (BID) | ORAL | Status: DC
  Administered 2018-07-13: 6.25 mg via ORAL
  Filled 2018-07-13: qty 2
  Filled 2018-07-13: qty 1

## 2018-07-13 MED ORDER — ASPIRIN EC 81 MG PO TBEC
81.0000 mg | DELAYED_RELEASE_TABLET | Freq: Once | ORAL | Status: AC
  Administered 2018-07-13: 81 mg via ORAL
  Filled 2018-07-13: qty 1

## 2018-07-13 MED ORDER — CINACALCET HCL 30 MG PO TABS
30.0000 mg | ORAL_TABLET | Freq: Every day | ORAL | Status: DC
  Administered 2018-07-13: 30 mg via ORAL
  Filled 2018-07-13: qty 1

## 2018-07-13 MED ORDER — HEPARIN SODIUM (PORCINE) 5000 UNIT/ML IJ SOLN: 5000.0000 [IU] | Freq: Three times a day (TID) | INTRAMUSCULAR | Status: DC

## 2018-07-13 MED ORDER — SIMVASTATIN 20 MG PO TABS
20.0000 mg | ORAL_TABLET | Freq: Every day | ORAL | Status: DC
  Administered 2018-07-13: 20 mg via ORAL
  Filled 2018-07-13: qty 1

## 2018-07-13 MED ORDER — HEPARIN (PORCINE) IN NACL 1000-0.9 UT/500ML-% IV SOLN
INTRAVENOUS | Status: AC
  Filled 2018-07-13: qty 1000

## 2018-07-13 MED ORDER — CLONIDINE HCL 0.1 MG PO TABS
0.1000 mg | ORAL_TABLET | Freq: Every day | ORAL | Status: DC
  Administered 2018-07-13: 0.1 mg via ORAL
  Filled 2018-07-13 (×4): qty 1

## 2018-07-13 MED ORDER — IOHEXOL 350 MG/ML SOLN
INTRAVENOUS | Status: DC | PRN
  Administered 2018-07-13: 105 mL via INTRACARDIAC

## 2018-07-13 MED ORDER — FLUTICASONE PROPIONATE 50 MCG/ACT NA SUSP
1.0000 | Freq: Every evening | NASAL | Status: DC | PRN
  Filled 2018-07-13: qty 16

## 2018-07-13 MED ORDER — SODIUM CHLORIDE 0.9 % IV SOLN: INTRAVENOUS | Status: DC

## 2018-07-13 MED ORDER — SODIUM CHLORIDE 0.9 % IV SOLN: 250.0000 mL | INTRAVENOUS | Status: DC | PRN

## 2018-07-13 MED ORDER — MIDAZOLAM HCL 2 MG/2ML IJ SOLN
INTRAMUSCULAR | Status: AC
  Filled 2018-07-13: qty 2

## 2018-07-13 MED ORDER — HYDRALAZINE HCL 20 MG/ML IJ SOLN
INTRAMUSCULAR | Status: AC
  Filled 2018-07-13: qty 1

## 2018-07-13 MED ORDER — EPOETIN ALFA 10000 UNIT/ML IJ SOLN
10000.0000 [IU] | INTRAMUSCULAR | Status: DC
  Administered 2018-07-14: 10000 [IU] via INTRAVENOUS
  Filled 2018-07-13 (×3): qty 1

## 2018-07-13 MED ORDER — DIPHENHYDRAMINE HCL 25 MG PO CAPS
50.0000 mg | ORAL_CAPSULE | Freq: Once | ORAL | Status: AC
  Administered 2018-07-13: 50 mg via ORAL
  Filled 2018-07-13: qty 2

## 2018-07-13 MED ORDER — LIDOCAINE-PRILOCAINE 2.5-2.5 % EX CREA
1.0000 "application " | TOPICAL_CREAM | CUTANEOUS | Status: DC
  Filled 2018-07-13: qty 5

## 2018-07-13 MED ORDER — METHYLPREDNISOLONE SODIUM SUCC 125 MG IJ SOLR
125.0000 mg | Freq: Once | INTRAMUSCULAR | Status: AC
  Administered 2018-07-13: 125 mg via INTRAVENOUS
  Filled 2018-07-13: qty 2

## 2018-07-13 MED ORDER — ASPIRIN 81 MG PO CHEW: 81.0000 mg | CHEWABLE_TABLET | ORAL | Status: AC

## 2018-07-13 MED ORDER — HEPARIN (PORCINE) IN NACL 1000-0.9 UT/500ML-% IV SOLN
INTRAVENOUS | Status: DC | PRN
  Administered 2018-07-13 (×2): 500 mL

## 2018-07-13 MED ORDER — DIPHENHYDRAMINE HCL 50 MG/ML IJ SOLN: 50.0000 mg | Freq: Once | INTRAMUSCULAR | Status: AC

## 2018-07-13 MED ORDER — HYDRALAZINE HCL 20 MG/ML IJ SOLN
10.0000 mg | Freq: Once | INTRAMUSCULAR | Status: AC
  Administered 2018-07-13: 10 mg via INTRAVENOUS

## 2018-07-13 MED ORDER — RENA-VITE PO TABS
1.0000 | ORAL_TABLET | Freq: Every day | ORAL | Status: DC
  Administered 2018-07-13 – 2018-07-14 (×2): 1 via ORAL
  Filled 2018-07-13 (×6): qty 1

## 2018-07-13 MED ORDER — SODIUM CHLORIDE 0.9% FLUSH
3.0000 mL | Freq: Two times a day (BID) | INTRAVENOUS | Status: DC
  Administered 2018-07-14 (×2): 3 mL via INTRAVENOUS

## 2018-07-13 MED ORDER — ESCITALOPRAM OXALATE 10 MG PO TABS
10.0000 mg | ORAL_TABLET | Freq: Every day | ORAL | Status: DC
  Administered 2018-07-13 – 2018-07-14 (×2): 10 mg via ORAL
  Filled 2018-07-13 (×3): qty 1

## 2018-07-13 MED ORDER — SODIUM CHLORIDE 0.9% FLUSH: 3.0000 mL | Freq: Two times a day (BID) | INTRAVENOUS | Status: DC

## 2018-07-13 MED ORDER — DIPHENOXYLATE-ATROPINE 2.5-0.025 MG PO TABS: 1.0000 | ORAL_TABLET | Freq: Four times a day (QID) | ORAL | Status: DC | PRN

## 2018-07-13 MED ORDER — TICAGRELOR 60 MG PO TABS
60.0000 mg | ORAL_TABLET | Freq: Two times a day (BID) | ORAL | Status: DC
  Administered 2018-07-13 – 2018-07-14 (×3): 60 mg via ORAL
  Filled 2018-07-13 (×9): qty 1

## 2018-07-13 MED ORDER — LORATADINE 10 MG PO TABS: 10.0000 mg | ORAL_TABLET | Freq: Every day | ORAL | Status: DC | PRN

## 2018-07-13 MED ORDER — SEVELAMER CARBONATE 800 MG PO TABS
2400.0000 mg | ORAL_TABLET | Freq: Two times a day (BID) | ORAL | Status: DC
  Administered 2018-07-13 – 2018-07-14 (×3): 2400 mg via ORAL
  Filled 2018-07-13 (×9): qty 3

## 2018-07-13 MED ORDER — CHLORHEXIDINE GLUCONATE CLOTH 2 % EX PADS: 6.0000 | MEDICATED_PAD | Freq: Every day | CUTANEOUS | Status: DC

## 2018-07-13 MED ORDER — DIPHENHYDRAMINE HCL 25 MG PO CAPS
50.0000 mg | ORAL_CAPSULE | Freq: Every day | ORAL | Status: DC | PRN
  Filled 2018-07-13: qty 2

## 2018-07-13 SURGICAL SUPPLY — 9 items
CATH INFINITI 5 FR IM (CATHETERS) ×2 IMPLANT
CATH INFINITI 5FR MULTPACK ANG (CATHETERS) ×2 IMPLANT
KIT HEART LEFT (KITS) ×2 IMPLANT
PACK CARDIAC CATHETERIZATION (CUSTOM PROCEDURE TRAY) ×2 IMPLANT
SHEATH PINNACLE 5F 10CM (SHEATH) ×2 IMPLANT
SHEATH PROBE COVER 6X72 (BAG) ×2 IMPLANT
TRANSDUCER W/STOPCOCK (MISCELLANEOUS) ×2 IMPLANT
TUBING CIL FLEX 10 FLL-RA (TUBING) ×2 IMPLANT
WIRE EMERALD 3MM-J .035X150CM (WIRE) ×2 IMPLANT

## 2018-07-13 NOTE — ED Notes (Signed)
Attempted to call report to dept [phone] again, was told the nurse will call back

## 2018-07-13 NOTE — Progress Notes (Signed)
Lab technician unsuccessful in blood draw x2 Called HD, HD RN stated pt is in second shift  Requested they draw blood, HD RN agreeable  Informed pt of plan of care

## 2018-07-13 NOTE — Progress Notes (Addendum)
Site area: RFA Site Prior to Removal:  Level 0 Pressure Applied For: 20 min  Manual:  yes  Patient Status During Pull:  stable Post Pull Site:  Level 0 Post Pull Instructions Given: yes  Post Pull Pulses Present: doppler Dressing Applied:  clear Bedrest begins @ 1600 till 2000 Comments: removed by Dalbert Batman

## 2018-07-13 NOTE — ED Triage Notes (Signed)
Pt reports onset of chest pain approx 10 pm, has taken 3 nitro with minimal relief.

## 2018-07-13 NOTE — ED Notes (Signed)
Given meal tray.

## 2018-07-13 NOTE — Consult Note (Addendum)
[facility]phrology Consult note:   Reason for Consult: To manage dialysis and dialysis related needs Referring Physician: Dr. Clementeen Graham  HPI: Shawna [name] is an 78 y.o. female with history of CAD status post a stent and CABG, hypertension, hyperlipidemia, A. fib not on anticoagulation because of GI bleed, NSVT, ESRD on hemodialysis MWF at [place] Dr. Lowanda Foster, admitted with chest pain for 1 day.  Patient reported the chest pain was on the left side and on back which has improved somewhat with nitroglycerin.  EKG showed LVH and ST depression in inferior and lateral leads compared to prior EKGs.  Patient receives dialysis MWF, [facility], Dr. Lowanda Foster, 3 hours 45 minutes, has a left upper extremity AV fistula.  The last treatment was on Wednesday.  Currently patient is comfortable.  She has no hypoxia.  Denied chest pain, shortness of breath, nausea, vomiting, headache, dizziness.  Her husband is at bedside.  Cardiology is evaluated the patient and plan to transfer her to cath this afternoon.    Past Medical History:  Diagnosis Date  . Acute on chronic respiratory failure with hypoxia ([place]) 10/10/2016  . Anxiety   . Arthritis   . AVM (arteriovenous malformation) of colon   . CAD (coronary artery disease)    a. s/p CABG in 2013 b. most recent DES to D1 in 10/2016 with cath showing patent LIMA-LAD, SVG-RI, SVG-Mrg and SVG-RCA  c. NST in 02/2018 showing scar with no significant ischemia  . Carotid artery disease ([place])    a. 29-52% LICA, 02/4131   . Chronic bronchitis ([place])   . Chronic diastolic CHF (congestive heart failure) ([place])    a. 02/2012 Echo EF 60-65%, nl wall motion, Gr 1 DD, mod MR  . Colon cancer ([place]) 1992  . Esophageal stricture   . ESRD on hemodialysis ([place])    ESRD due to HTN, started dialysis 2011 and gets HD at [facility] with Dr Hinda Lenis on MWF schedule.  Access is LUA AVF as of Sept 2014.   Marland Kitchen GERD (gastroesophageal reflux disease)   . High  cholesterol 12/2011  . History of blood transfusion 07/2011; 12/2011; 01/2012 X 2; 04/2012  . History of gout   . History of lower GI bleeding   . Hypertension   . Iron deficiency anemia   . Mitral regurgitation    a. Moderate by echo, 02/2012  . Myocardial infarction ([place])   . Ovarian cancer ([place]) 1992  . Pneumonia ~ 2009  . PUD (peptic ulcer disease)   . TIA (transient ischemic attack)     Past Surgical History:  Procedure Laterality Date  . ABDOMINAL HYSTERECTOMY  1992  . APPENDECTOMY  06/1990  . AV FISTULA PLACEMENT  07/2009   left upper arm  . AV FISTULA PLACEMENT Right 09/06/2016   Procedure: RIGHT FOREARM ARTERIOVENOUS (AV) GRAFT;  Surgeon: Elam Dutch, MD;  Location: [facility] OR;  Service: Vascular;  Laterality: Right;  . AV FISTULA PLACEMENT N/A 02/24/2017   Procedure: INSERTION OF ARTERIOVENOUS (AV) GORE-TEX GRAFT ARM (BRACHIAL AXILLARY);  Surgeon: Katha Cabal, MD;  Location: [facility] ORS;  Service: Vascular;  Laterality: N/A;  . Baring Right 09/06/2016   Procedure: REMOVAL OF Right Arm ARTERIOVENOUS GORETEX GRAFT and Vein Patch angioplasty of brachial artery;  Surgeon: Angelia Mould, MD;  Location: [place];  Service: Vascular;  Laterality: Right;  . COLON RESECTION  1992  . COLON SURGERY    . CORONARY ANGIOPLASTY WITH STENT PLACEMENT  12/15/11   "  2"  . CORONARY ANGIOPLASTY WITH STENT PLACEMENT  y/2013   "1; makes total of 3" (05/02/2012)  . CORONARY ARTERY BYPASS GRAFT  06/13/2012   Procedure: CORONARY ARTERY BYPASS GRAFTING (CABG);  Surgeon: Grace Isaac, MD;  Location: [place];  Service: Open Heart Surgery;  Laterality: N/A;  cabg x four;  using left internal mammary artery, and left leg greater saphenous vein harvested endoscopically  . CORONARY STENT INTERVENTION N/A 10/13/2016   Procedure: Coronary Stent Intervention;  Surgeon: Troy Sine, MD;  Location: [place] CV LAB;  Service: Cardiovascular;  Laterality: N/A;  . DIALYSIS/PERMA CATHETER REMOVAL N/A  04/18/2017   Procedure: DIALYSIS/PERMA CATHETER REMOVAL;  Surgeon: Katha Cabal, MD;  Location: [place] CV LAB;  Service: Cardiovascular;  Laterality: N/A;  . DILATION AND CURETTAGE OF UTERUS    . ESOPHAGOGASTRODUODENOSCOPY  01/20/2012   Procedure: ESOPHAGOGASTRODUODENOSCOPY (EGD);  Surgeon: Ladene Artist, MD,FACG;  Location: [facility] ENDOSCOPY;  Service: Endoscopy;  Laterality: N/A;  . ESOPHAGOGASTRODUODENOSCOPY N/A 03/26/2013   Procedure: ESOPHAGOGASTRODUODENOSCOPY (EGD);  Surgeon: Irene Shipper, MD;  Location: [facility] ENDOSCOPY;  Service: Endoscopy;  Laterality: N/A;  . ESOPHAGOGASTRODUODENOSCOPY N/A 04/30/2015   Procedure: ESOPHAGOGASTRODUODENOSCOPY (EGD);  Surgeon: Rogene Houston, MD;  [facility];  Service: Endoscopy;  Laterality: N/A;  1pm - moved to 10/20 @ 1:10  . ESOPHAGOGASTRODUODENOSCOPY N/A 07/29/2016   Procedure: ESOPHAGOGASTRODUODENOSCOPY (EGD);  Surgeon: Manus Gunning, MD;  Location: [place];  Service: Gastroenterology;  Laterality: N/A;  enteroscopy  . INTRAOPERATIVE TRANSESOPHAGEAL ECHOCARDIOGRAM  06/13/2012   Procedure: INTRAOPERATIVE TRANSESOPHAGEAL ECHOCARDIOGRAM;  Surgeon: Grace Isaac, MD;  Location: [place];  Service: Open Heart Surgery;  Laterality: N/A;  . IR GENERIC HISTORICAL  07/26/2016   IR FLUORO GUIDE CV LINE RIGHT 07/26/2016 Greggory Keen, MD [facility]  . IR GENERIC HISTORICAL  07/26/2016   IR US GUIDE VASC ACCESS RIGHT 07/26/2016 Greggory Keen, MD [facility]  . IR GENERIC HISTORICAL  08/02/2016   IR US GUIDE VASC ACCESS RIGHT 08/02/2016 Greggory Keen, MD [facility]  . IR GENERIC HISTORICAL  08/02/2016   IR FLUORO GUIDE CV LINE RIGHT 08/02/2016 Greggory Keen, MD [facility]  . IR RADIOLOGY PERIPHERAL GUIDED IV START  03/28/2017  . IR US GUIDE VASC ACCESS RIGHT  03/28/2017  . LEFT HEART CATH AND CORONARY ANGIOGRAPHY N/A 09/20/2016   Procedure: Left Heart Cath and Coronary Angiography;  Surgeon: [name], MD;  Location: [place] CV LAB;  Service: Cardiovascular;  Laterality: N/A;  . LEFT HEART CATH AND CORS/GRAFTS ANGIOGRAPHY N/A 10/13/2016   Procedure: Left Heart Cath and Cors/Grafts Angiography;  Surgeon: Troy Sine, MD;  Location: [place] CV LAB;  Service: Cardiovascular;  Laterality: N/A;  . LEFT HEART CATHETERIZATION WITH CORONARY ANGIOGRAM N/A 12/15/2011   Procedure: LEFT HEART CATHETERIZATION WITH CORONARY ANGIOGRAM;  Surgeon: Burnell Blanks, MD;  Location: [facility] CATH LAB;  Service: Cardiovascular;  Laterality: N/A;  . LEFT HEART CATHETERIZATION WITH CORONARY ANGIOGRAM N/A 01/10/2012   Procedure: LEFT HEART CATHETERIZATION WITH CORONARY ANGIOGRAM;  Surgeon: Peter M Martinique, MD;  Location: [facility] CATH LAB;  Service: Cardiovascular;  Laterality: N/A;  . LEFT HEART CATHETERIZATION WITH CORONARY ANGIOGRAM N/A 06/08/2012   Procedure: LEFT HEART CATHETERIZATION WITH CORONARY ANGIOGRAM;  Surgeon: Burnell Blanks, MD;  Location: [facility] CATH LAB;  Service: Cardiovascular;  Laterality: N/A;  . LEFT HEART CATHETERIZATION WITH CORONARY/GRAFT ANGIOGRAM N/A 12/10/2013   Procedure: LEFT HEART CATHETERIZATION WITH Beatrix Fetters;  Surgeon: [name], MD;  Location: [place] CATH LAB;  Service: Cardiovascular;  Laterality: N/A;  . OVARY SURGERY     ovarian cancer  . REVISION OF ARTERIOVENOUS GORETEX GRAFT N/A 02/24/2017   Procedure: REVISION OF ARTERIOVENOUS GORETEX GRAFT (RESECTION);  Surgeon: Katha Cabal, MD;  Location: [facility] ORS;  Service: Vascular;  Laterality: N/A;  Earney Mallet N/A 10/15/2013   Procedure: Fistulogram;  Surgeon: Serafina Mitchell, MD;  Location: [facility] CATH LAB;  Service: Cardiovascular;  Laterality: N/A;  . THROMBECTOMY / ARTERIOVENOUS GRAFT REVISION  2011   left upper arm  . TUBAL LIGATION  1980's  . UPPER EXTREMITY ANGIOGRAPHY Bilateral 12/06/2016   Procedure: Upper Extremity Angiography;  Surgeon: Katha Cabal, MD;  Location: [place] CV LAB;  Service: Cardiovascular;   Laterality: Bilateral;  . UPPER EXTREMITY INTERVENTION Left 06/06/2017   Procedure: UPPER EXTREMITY INTERVENTION;  Surgeon: Katha Cabal, MD;  Location: [place] CV LAB;  Service: Cardiovascular;  Laterality: Left;    Family History  Problem Relation Age of Onset  . Heart disease Mother        Heart Disease before age 68  . Hyperlipidemia Mother   . Hypertension Mother   . Diabetes Mother   . Heart attack Mother   . Heart disease Father        Heart Disease before age 69  . Hyperlipidemia Father   . Hypertension Father   . Diabetes Father   . Diabetes Sister   . Hypertension Sister   . Diabetes Brother   . Hyperlipidemia Brother   . Heart attack Brother   . Hypertension Sister   . Heart attack Brother   . Other Other        noncontributory for early CAD  . Colon cancer Neg Hx   . Esophageal cancer Neg Hx   . Liver disease Neg Hx   . Kidney disease Neg Hx   . Colon polyps Neg Hx     Social History:  reports that she has never smoked. She has never used smokeless tobacco. She reports that she does not drink alcohol or use drugs.  Allergies:  Allergies  Allergen Reactions  . Aspirin Other (See Comments)    High Doses Mess up her stomach; "makes my bowels have blood in them". Takes 81 mg EC Aspirin   . Penicillins Other (See Comments)    SYNCOPE? , "makes me real weak when I take it; like I'll pass out"  Has patient had a PCN reaction causing immediate rash, facial/tongue/throat swelling, SOB or lightheadedness with hypotension: Yes Has patient had a PCN reaction causing severe rash involving mucus membranes or skin necrosis: no Has patient had a PCN reaction that required hospitalization no Has patient had a PCN reaction occurring within the last 10 years: no If all of the above  . Amlodipine Swelling  . Bactrim [Sulfamethoxazole-Trimethoprim] Rash  . Contrast Media [Iodinated Diagnostic Agents] Itching  . Iron Itching and Other (See Comments)    "they  gave me iron in dialysis; had to give me Benadryl cause I had to have the iron" (05/02/2012)  . Nitrofurantoin Hives  . Tylenol [Acetaminophen] Itching and Other (See Comments)    Makes her feet on fire per pt  . Gabapentin Other (See Comments)    Unknown reaction  . Dexilant [Dexlansoprazole] Other (See Comments)    Upset stomach  . Levaquin [Levofloxacin In D5w] Rash  . Morphine And Related Itching and Other (See Comments)    Itching in feet  . Plavix [  Clopidogrel Bisulfate] Rash  . Protonix [Pantoprazole Sodium] Rash  . Venofer [Ferric Oxide] Itching and Other (See Comments)    Patient reports using Benadryl prior to doses as [facility]    Medications: I have reviewed the patient's current medications.   Results for orders placed or performed during the hospital encounter of 07/13/18 (from the past 48 hour(s))  Basic metabolic panel     Status: Abnormal   Collection Time: 07/13/18  2:03 AM  Result Value Ref Range   Sodium 142 135 - 145 mmol/L   Potassium 3.8 3.5 - 5.1 mmol/L   Chloride 101 98 - 111 mmol/L   CO2 29 22 - 32 mmol/L   Glucose, Bld 81 70 - 99 mg/dL   BUN 55 (H) 8 - 23 mg/dL   Creatinine, Ser 7.98 (H) 0.44 - 1.00 mg/dL   Calcium 8.7 (L) 8.9 - 10.3 mg/dL   GFR calc non Af Amer 4 (L) >60 mL/min   GFR calc Af Amer 5 (L) >60 mL/min   Anion gap 12 5 - 15    Comment: Performed at [facility], [address], [place], [place] [address]  CBC with Differential/Platelet     Status: Abnormal   Collection Time: 07/13/18  2:03 AM  Result Value Ref Range   WBC 6.7 4.0 - 10.5 K/uL   RBC 3.30 (L) 3.87 - 5.11 MIL/uL   Hemoglobin 11.5 (L) 12.0 - 15.0 g/dL   HCT 36.6 36.0 - 46.0 %   MCV 110.9 (H) 80.0 - 100.0 fL   MCH 34.8 (H) 26.0 - 34.0 pg   MCHC 31.4 30.0 - 36.0 g/dL   RDW 14.3 11.5 - 15.5 %   Platelets 155 150 - 400 K/uL   nRBC 0.0 0.0 - 0.2 %   Neutrophils Relative % 77 %   Neutro Abs 5.1 1.7 - 7.7 K/uL   Lymphocytes Relative 11 %   Lymphs Abs 0.8 0.7 - 4.0  K/uL   Monocytes Relative 8 %   Monocytes Absolute 0.5 0.1 - 1.0 K/uL   Eosinophils Relative 3 %   Eosinophils Absolute 0.2 0.0 - 0.5 K/uL   Basophils Relative 1 %   Basophils Absolute 0.1 0.0 - 0.1 K/uL   Immature Granulocytes 0 %   Abs Immature Granulocytes 0.02 0.00 - 0.07 K/uL    Comment: Performed at [facility], [address], [place], [place] [address]  Troponin I - ONCE - STAT     Status: Abnormal   Collection Time: 07/13/18  2:03 AM  Result Value Ref Range   Troponin I 0.05 (HH) <0.03 ng/mL    Comment: CRITICAL RESULT CALLED TO, READ BACK BY AND VERIFIED WITH: C BOLTON,RN @0250  07/13/18 [facility] Performed at [facility], [address], [place], [place] [address]   Troponin I - Now Then Q6H     Status: Abnormal   Collection Time: 07/13/18  8:36 AM  Result Value Ref Range   Troponin I 0.06 (HH) <0.03 ng/mL    Comment: CRITICAL RESULT CALLED TO, READ BACK BY AND VERIFIED WITH: HIATT,C AT 9:20AM ON 07/13/18 BY [facility] Performed at [facility], [address], [place],  [address]     Dg Chest 2 View  Result Date: 07/13/2018 CLINICAL DATA:  Chest pain EXAM: CHEST - 2 VIEW COMPARISON:  03/23/2018 FINDINGS: Pain mild cardiomegaly with prior CABG. Right-greater-than-left basilar atelectasis. No focal airspace consolidation or pulmonary edema. IMPRESSION: No active cardiopulmonary disease. Electronically Signed   By: Ulyses Jarred  M.D.   On: 07/13/2018 02:09    ROS: As H&P, other systems reviewed and unremarkable Blood pressure (!) 177/62, pulse 66, temperature 97.6 F (36.4 C), temperature source Oral, resp. rate 18, height 5\' 1"  (1.549 m), weight 54.9 kg, SpO2 100 %.  General: Lying in bed comfortable, not in distress, HEENT: PERRLA Respiratory: Bilateral clear lungs, no wheezing or crackle Cardiovascular: Regular, S1-S2 normal, no murmur or rub appreciated GI: Abdomen soft, nontender, nontender.  Bowel sounds positive Extremities: No cyanosis, no lower extremity  edema Vascular: Left upper extremity AV fistula has aneurysmal dilatation, good thrill and bruit Neurology: Alert, awake, oriented, no focal deficit Skin: No rash or lesions Psychiatric: Mood and affect normal.  Assessment/Plan: 1 chest pain likely unstable angina: Discussed with the cardiology team.  Plan to transfer to [facility] Cath Lab this afternoon.  Currently chest pain free.   2 ESRD MWF at [place]: Plan for dialysis today after the cardiac cath at [facility].  Discussed with the cardiology team.  3K, UF 2 to 3 L as tolerated, no heparin.  She has left upper extremity AV fistula for the access. -Requested the dialysis record from her center.  3 Hypertension: Pressure elevated.  Volume status looks acceptable.  Resume home medication.  Monitor blood pressure.  She is currently n.p.o.  4. Anemia of ESRD: hb acceptable.  5. Metabolic Bone Disease: Check phosphorus level.  Thank you for the consult.   Dron Tanna Furry 07/13/2018, 11:56 AM

## 2018-07-13 NOTE — Progress Notes (Signed)
Signed and held orders for HD RN, pt scheduled for later this evening for HD

## 2018-07-13 NOTE — Progress Notes (Signed)
Pt connected to frequent vital signs and telemetry  Right femoral site clean, dry and intact  Paged PA for diet order  Educated pt on bed rest till 8pm per cath lab RN, pt and husband verbalize understanding  Will continue to monitor

## 2018-07-13 NOTE — ED Provider Notes (Signed)
[facility] EMERGENCY DEPARTMENT Provider Note   CSN: [id number] Arrival date & time: 07/13/18  0031     History   Chief Complaint Chief Complaint  Patient presents with  . Chest Pain    HPI Shawna [name] is a 78 y.o. female.  The history is provided by the patient and the spouse.  Chest Pain   This is a new problem. The problem has been gradually improving. The pain is moderate. Radiates to: Radiates from back to chest. Associated symptoms include nausea and shortness of breath. Pertinent negatives include no fever.   Patient with history of multiple medical conditions including CAD, CHF, ESRD on dialysis presents with chest and back pain.  She reports she had an episode of left scapular pain that radiated to her chest, she took a nitroglycerin with improvement.  The same pain return later from her back into her chest that did not improve with nitroglycerin.  There is also associated shortness of breath.  It began several hours ago and is been intermittent in nature. She reports this is worse than normal episodes of chest pain She denies any recent missed dialysis session Past Medical History:  Diagnosis Date  . Acute on chronic respiratory failure with hypoxia ([place]) 10/10/2016  . Anxiety   . Arthritis   . AVM (arteriovenous malformation) of colon   . CAD (coronary artery disease)    a. 12/2011 NSTEMI/Cath/PCI LCX (2.25x14 Resolute DES) & D1 (2.25x22 Resolute DES);  b. 01/2012 Cath/PCI: LM 30, LAD 30p, 40-31m, D1 stent ok, 99 in sm branch of diag, LCX patent stent, OM1 20, RCA 95 ost (4.0x12 Promus DES), EF 55%;  c. 04/2012 Lexi Cardiolite  EF 48%, small area of scar @ base/mid inflat wall with mild peri-infarct ischemia.; CABG 12/4  . Carotid artery disease ([place])    a. 12-87% LICA, 02/6766   . Chronic bronchitis ([place])   . Chronic diastolic CHF (congestive heart failure) ([place])    a. 02/2012 Echo EF 60-65%, nl wall motion, Gr 1 DD, mod MR  . Colon cancer ([place]) 1992  . Esophageal  stricture   . ESRD on hemodialysis ([place])    ESRD due to HTN, started dialysis 2011 and gets HD at [facility] with Dr Hinda Lenis on MWF schedule.  Access is LUA AVF as of Sept 2014.   Marland Kitchen GERD (gastroesophageal reflux disease)   . High cholesterol 12/2011  . History of blood transfusion 07/2011; 12/2011; 01/2012 X 2; 04/2012  . History of gout   . History of lower GI bleeding   . Hypertension   . Iron deficiency anemia   . Mitral regurgitation    a. Moderate by echo, 02/2012  . Myocardial infarction ([place])   . Ovarian cancer ([place]) 1992  . Pneumonia ~ 2009  . PUD (peptic ulcer disease)   . TIA (transient ischemic attack)     Patient Active Problem List   Diagnosis Date Noted  . Hand steal syndrome ([place]) 08/01/2017  . Anemia 07/14/2017  . Coronary artery disease of bypass graft of native heart with stable angina pectoris ([place]) 06/05/2017  . Mesenteric ischemia ([place])   . Diverticulitis   . SBO (small bowel obstruction) ([place]) 03/22/2017  . Enteritis   . Complication of vascular access for dialysis 03/19/2017  . Preoperative clearance 01/25/2017  . Symptomatic anemia 10/24/2016  . H/O non-ST elevation myocardial infarction (NSTEMI) 10/24/2016  . Fluid overload 10/10/2016  . Complication from renal dialysis device 10/10/2016  . Non-ST elevation MI (NSTEMI) (  [place])   . Encounter for fitting and adjustment of vascular catheter   . End stage renal disease ([place])   . ESRD (end stage renal disease) ([place])   . Heme positive stool   . Demand ischemia ([place]) 07/27/2016  . Hypertensive emergency 07/08/2016  . Acute on chronic respiratory failure with hypoxia ([place])   . Cardiac arrest ([place])   . Palliative care encounter   . Goals of care, counseling/discussion   . Hypertensive crisis without congestive heart failure 05/09/2016  . Acute pulmonary edema ([place]) 04/06/2016  . Acute respiratory failure ([place]) 04/06/2016  . Hypertensive crisis 01/27/2016  . History of colon cancer 01/27/2016  . History of  ovarian cancer 01/27/2016  . Hypertensive urgency 01/27/2016  . Narrow complex tachycardia ([place]) 09/08/2015  . SVT (supraventricular tachycardia) ([place]) 09/08/2015  . Influenza A 08/30/2015  . Acute on chronic diastolic CHF (congestive heart failure) ([place]) 05/04/2015  . Unstable angina ([place]) 05/03/2015  . Essential hypertension   . Pain in joint, lower leg 08/14/2014  . Chest pain 11/26/2013  . Small bowel obstruction, partial ([place]) 05/29/2013  . Chronic diastolic CHF (congestive heart failure) ([place]) 03/22/2013  . GI bleeding 03/21/2013  . Acute blood loss anemia 03/21/2013  . Vaginal odor 03/12/2013  . Vaginal discharge 03/12/2013  . Occlusion and stenosis of carotid artery without mention of cerebral infarction 01/24/2013  . Hx of CABG 07/05/2012  . Carotid artery disease ([place]) 07/05/2012  . Anemia of chronic kidney failure 07/03/2012  . Secondary hyperparathyroidism ([place]) 07/03/2012  . Mitral regurgitation 06/12/2012  . Pneumonia 06/09/2012  . Non-STEMI (non-ST elevated myocardial infarction) ([place]) 06/08/2012  . 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 GERD (gastroesophageal reflux disease) 01/09/2012  . HLD (hyperlipidemia) 01/05/2012  . Atherosclerosis of native coronary artery of native heart without angina pectoris 12/16/2011  . ESRD on hemodialysis ([place]) 12/16/2011    Past Surgical History:  Procedure Laterality Date  . ABDOMINAL HYSTERECTOMY  1992  . APPENDECTOMY  06/1990  . AV FISTULA PLACEMENT  07/2009   left upper arm  . AV FISTULA PLACEMENT Right 09/06/2016   Procedure: RIGHT FOREARM ARTERIOVENOUS (AV) GRAFT;  Surgeon: Elam Dutch, MD;  Location: [facility];  Service: Vascular;  Laterality: Right;  . AV FISTULA PLACEMENT N/A 02/24/2017   Procedure: INSERTION OF ARTERIOVENOUS (AV) GORE-TEX GRAFT ARM (BRACHIAL AXILLARY);  Surgeon: Katha Cabal, MD;  Location: [facility] ORS;  Service: Vascular;  Laterality: N/A;  . [place] Right  09/06/2016   Procedure: REMOVAL OF Right Arm ARTERIOVENOUS GORETEX GRAFT and Vein Patch angioplasty of brachial artery;  Surgeon: Angelia Mould, MD;  Location: [place];  Service: Vascular;  Laterality: Right;  . COLON RESECTION  1992  . COLON SURGERY    . CORONARY ANGIOPLASTY WITH STENT PLACEMENT  12/15/11   "2"  . CORONARY ANGIOPLASTY WITH STENT PLACEMENT  y/2013   "1; makes total of 3" (05/02/2012)  . CORONARY ARTERY BYPASS GRAFT  06/13/2012   Procedure: CORONARY ARTERY BYPASS GRAFTING (CABG);  Surgeon: Grace Isaac, MD;  Location: [place];  Service: Open Heart Surgery;  Laterality: N/A;  cabg x four;  using left internal mammary artery, and left leg greater saphenous vein harvested endoscopically  . CORONARY STENT INTERVENTION N/A 10/13/2016   Procedure: Coronary Stent Intervention;  Surgeon: Troy Sine, MD;  Location: [place] CV LAB;  Service: Cardiovascular;  Laterality: N/A;  . DIALYSIS/PERMA CATHETER REMOVAL N/A 04/18/2017   Procedure: DIALYSIS/PERMA CATHETER REMOVAL;  Surgeon:  Schnier, Dolores Lory, MD;  Location: [place] CV LAB;  Service: Cardiovascular;  Laterality: N/A;  . DILATION AND CURETTAGE OF UTERUS    . ESOPHAGOGASTRODUODENOSCOPY  01/20/2012   Procedure: ESOPHAGOGASTRODUODENOSCOPY (EGD);  Surgeon: Ladene Artist, MD,FACG;  Location: [facility] ENDOSCOPY;  Service: Endoscopy;  Laterality: N/A;  . ESOPHAGOGASTRODUODENOSCOPY N/A 03/26/2013   Procedure: ESOPHAGOGASTRODUODENOSCOPY (EGD);  Surgeon: Irene Shipper, MD;  Location: [facility] ENDOSCOPY;  Service: Endoscopy;  Laterality: N/A;  . ESOPHAGOGASTRODUODENOSCOPY N/A 04/30/2015   Procedure: ESOPHAGOGASTRODUODENOSCOPY (EGD);  Surgeon: Rogene Houston, MD;  [facility];  Service: Endoscopy;  Laterality: N/A;  1pm - moved to 10/20 @ 1:10  . ESOPHAGOGASTRODUODENOSCOPY N/A 07/29/2016   Procedure: ESOPHAGOGASTRODUODENOSCOPY (EGD);  Surgeon: Manus Gunning, MD;  Location: [place];  Service: Gastroenterology;   Laterality: N/A;  enteroscopy  . INTRAOPERATIVE TRANSESOPHAGEAL ECHOCARDIOGRAM  06/13/2012   Procedure: INTRAOPERATIVE TRANSESOPHAGEAL ECHOCARDIOGRAM;  Surgeon: Grace Isaac, MD;  Location: [place];  Service: Open Heart Surgery;  Laterality: N/A;  . IR GENERIC HISTORICAL  07/26/2016   IR FLUORO GUIDE CV LINE RIGHT 07/26/2016 Greggory Keen, MD [facility]  . IR GENERIC HISTORICAL  07/26/2016   IR US GUIDE VASC ACCESS RIGHT 07/26/2016 Greggory Keen, MD [facility]  . IR GENERIC HISTORICAL  08/02/2016   IR US GUIDE VASC ACCESS RIGHT 08/02/2016 Greggory Keen, MD [facility]  . IR GENERIC HISTORICAL  08/02/2016   IR FLUORO GUIDE CV LINE RIGHT 08/02/2016 Greggory Keen, MD [facility]  . IR RADIOLOGY PERIPHERAL GUIDED IV START  03/28/2017  . IR US GUIDE VASC ACCESS RIGHT  03/28/2017  . LEFT HEART CATH AND CORONARY ANGIOGRAPHY N/A 09/20/2016   Procedure: Left Heart Cath and Coronary Angiography;  Surgeon: [name], MD;  Location: [place] CV LAB;  Service: Cardiovascular;  Laterality: N/A;  . LEFT HEART CATH AND CORS/GRAFTS ANGIOGRAPHY N/A 10/13/2016   Procedure: Left Heart Cath and Cors/Grafts Angiography;  Surgeon: Troy Sine, MD;  Location: [place] CV LAB;  Service: Cardiovascular;  Laterality: N/A;  . LEFT HEART CATHETERIZATION WITH CORONARY ANGIOGRAM N/A 12/15/2011   Procedure: LEFT HEART CATHETERIZATION WITH CORONARY ANGIOGRAM;  Surgeon: Burnell Blanks, MD;  Location: [facility] CATH LAB;  Service: Cardiovascular;  Laterality: N/A;  . LEFT HEART CATHETERIZATION WITH CORONARY ANGIOGRAM N/A 01/10/2012   Procedure: LEFT HEART CATHETERIZATION WITH CORONARY ANGIOGRAM;  Surgeon: Peter M Martinique, MD;  Location: [facility] CATH LAB;  Service: Cardiovascular;  Laterality: N/A;  . LEFT HEART CATHETERIZATION WITH CORONARY ANGIOGRAM N/A 06/08/2012   Procedure: LEFT HEART CATHETERIZATION WITH CORONARY ANGIOGRAM;  Surgeon: Burnell Blanks, MD;  Location: [facility] C[facility] LAB;  Service: Cardiovascular;   Laterality: N/A;  . LEFT HEART CATHETERIZATION WITH CORONARY/GRAFT ANGIOGRAM N/A 12/10/2013   Procedure: LEFT HEART CATHETERIZATION WITH Beatrix Fetters;  Surgeon: [name], MD;  Location: [facility] CATH LAB;  Service: Cardiovascular;  Laterality: N/A;  . OVARY SURGERY     ovarian cancer  . REVISION OF ARTERIOVENOUS GORETEX GRAFT N/A 02/24/2017   Procedure: REVISION OF ARTERIOVENOUS GORETEX GRAFT (RESECTION);  Surgeon: Katha Cabal, MD;  Location: [facility] ORS;  Service: Vascular;  Laterality: N/A;  [place] N/A 10/15/2013   Procedure: Fistulogram;  Surgeon: Serafina Mitchell, MD;  Location[facility] C[facility] LAB;  Service: Cardiovascular;  Laterality: N/A;  . THROMBECTOMY / ARTERIOVENOUS GRAFT REVISION  2011   left upper arm  . TUBAL LIGATION  1980's  . UPPER EXTREMITY ANGIOGRAPHY Bilateral 12/06/2016   Procedure: Upper Extremity Angiography;  Surgeon:  Schnier, Dolores Lory, MD;  Location: [place] CV LAB;  Service: Cardiovascular;  Laterality: Bilateral;  . UPPER EXTREMITY INTERVENTION Left 06/06/2017   Procedure: UPPER EXTREMITY INTERVENTION;  Surgeon: Katha Cabal, MD;  Location: [place] CV LAB;  Service: Cardiovascular;  Laterality: Left;     OB History    Gravida  2   Para  2   Term      Preterm  2   AB      Living  2     SAB      TAB      Ectopic      Multiple      Live Births               Home Medications    Prior to Admission medications   Medication Sig Start Date End Date Taking? Authorizing Provider  ALPRAZolam (XANAX) 0.25 MG tablet Take 1 tablet (0.25 mg total) by mouth 2 (two) times daily as needed for anxiety or sleep. 04/05/17   Arrien, Jimmy Picket, MD  aspirin EC 81 MG tablet Take 81 mg by mouth once. FOR CHEST PAIN    [provider]  bisacodyl (DULCOLAX) 10 MG suppository Place 1 suppository (10 mg total) rectally as needed for moderate constipation. 11/21/17   Rehman, Mechele Dawley, MD  BRILINTA 60 MG TABS tablet TAKE 1  TABLET BY MOUTH TWICE DAILY 04/10/18   Herminio Commons, MD  carvedilol (COREG) 6.25 MG tablet TAKE 1 TABLET BY MOUTH TWICE DAILY WITH  A  MEAL Patient taking differently: TAKE 1 TABLET (6.25 MG) BY MOUTH TWICE DAILY WITH  A  MEAL 06/15/17   Herminio Commons, MD  cinacalcet (SENSIPAR) 30 MG tablet Take 30 mg by mouth daily after supper.    [provider]  cloNIDine (CATAPRES) 0.2 MG tablet Take 0.1-0.2 mg by mouth daily as needed (high blood pressure).     [provider]  diphenhydrAMINE (BENADRYL) 25 mg capsule Take 50 mg by mouth daily as needed for itching or allergies.     [provider]  diphenoxylate-atropine (LOMOTIL) 2.5-0.025 MG tablet Take 1 tablet by mouth 4 (four) times daily as needed for diarrhea or loose stools. 08/13/17   Tanna Furry, MD  epoetin alfa (EPOGEN,PROCRIT) 05397 UNIT/ML injection Inject 1 mL (10,000 Units total) into the vein every Monday, Wednesday, and Friday with hemodialysis. 04/11/16   Orvan Falconer, MD  escitalopram (LEXAPRO) 10 MG tablet  11/16/17   [provider]  ferrous sulfate 325 (65 FE) MG EC tablet Take 1 tablet (325 mg total) by mouth 2 (two) times daily with a meal. Patient taking differently: Take 325 mg by mouth daily with breakfast.  07/15/17 05/10/18  Johnson, Clanford L, MD  fluticasone (FLONASE) 50 MCG/ACT nasal spray Place 1 spray at bedtime as needed into both nostrils for allergies.     [provider]  hydrALAZINE (APRESOLINE) 50 MG tablet Take 75 mg by mouth 2 (two) times daily.    [provider]  isosorbide mononitrate (IMDUR) 120 MG 24 hr tablet TAKE 1 TABLET BY MOUTH ONCE DAILY 07/05/18   Herminio Commons, MD  lanthanum (FOSRENOL) 1000 MG chewable tablet Chew by mouth. 04/29/11   [provider]  lidocaine-prilocaine (EMLA) cream Apply 1 application topically every Monday, Wednesday, and Friday. Prior to dialysis    [provider]  loratadine (CLARITIN) 10 MG tablet  Take 10 mg by mouth daily as needed for allergies.  [provider]  multivitamin (RENA-VIT) TABS tablet Take 1 tablet by mouth daily.    [provider]  nitroGLYCERIN (NITROSTAT) 0.4 MG SL tablet Place 1 tablet (0.4 mg total) under the tongue every 5 (five) minutes as needed for chest pain. 06/05/17   Herminio Commons, MD  nystatin-triamcinolone (MYCOLOG II) cream Apply to the affected area twice a day 06/29/18   Rogene Houston, MD  omeprazole (PRILOSEC) 20 MG capsule Take 1 capsule (20 mg total) by mouth daily. 11/21/17   Rehman, Mechele Dawley, MD  ondansetron (ZOFRAN-ODT) 4 MG disintegrating tablet Take 4 mg by mouth every 8 (eight) hours as needed for nausea or vomiting.  07/12/17   [provider]  sevelamer carbonate (RENVELA) 800 MG tablet Take 800-2,400 mg by mouth See admin instructions. Take 3 tablets (2400) by mouth twice daily with meals, take 1 tablet (800 mg) with snacks    [provider]  simvastatin (ZOCOR) 20 MG tablet TAKE ONE TABLET (20 MG) BY MOUTH DAILY AT BEDTIME 04/03/18   Herminio Commons, MD  ticagrelor (BRILINTA) 60 MG TABS tablet Take 1 tablet (60 mg total) by mouth 2 (two) times daily. 03/19/18   Herminio Commons, MD    Family History Family History  Problem Relation Age of Onset  . Heart disease Mother        Heart Disease before age 99  . Hyperlipidemia Mother   . Hypertension Mother   . Diabetes Mother   . Heart attack Mother   . Heart disease Father        Heart Disease before age 54  . Hyperlipidemia Father   . Hypertension Father   . Diabetes Father   . Diabetes Sister   . Hypertension Sister   . Diabetes Brother   . Hyperlipidemia Brother   . Heart attack Brother   . Hypertension Sister   . Heart attack Brother   . Other Other        noncontributory for early CAD  . Colon cancer Neg Hx   . Esophageal cancer Neg Hx   . Liver disease Neg Hx   . Kidney disease Neg Hx   . Colon polyps Neg Hx     Social  History Social History   Tobacco Use  . Smoking status: Never Smoker  . Smokeless tobacco: Never Used  Substance Use Topics  . Alcohol use: No    Alcohol/week: 0.0 standard drinks  . Drug use: No     Allergies   Aspirin; Penicillins; Amlodipine; Bactrim [sulfamethoxazole-trimethoprim]; Contrast media [iodinated diagnostic agents]; Iron; Nitrofurantoin; Tylenol [acetaminophen]; Gabapentin; Dexilant [dexlansoprazole]; Levaquin [levofloxacin in d5w]; Morphine and related; Plavix [clopidogrel bisulfate]; Protonix [pantoprazole sodium]; and Venofer [ferric oxide]   Review of Systems Review of Systems  Constitutional: Negative for fever.  Respiratory: Positive for shortness of breath.   Cardiovascular: Positive for chest pain.  Gastrointestinal: Positive for nausea.  All other systems reviewed and are negative.    Physical Exam Updated Vital Signs BP (!) 173/62   Pulse 69   Temp 98.3 F (36.8 C) (Oral)   Resp (!) 22   Ht 1.549 m (5\' 1" )   Wt 54.9 kg   SpO2 98%   BMI 22.86 kg/m   Physical Exam  CONSTITUTIONAL: Elderly, no acute distress HEAD: Normocephalic/atraumatic EYES: EOMI ENMT: Mucous membranes moist NECK: supple no meningeal signs SPINE/BACK:entire spine nontender CV: S1/S2 noted LUNGS: Lungs are clear to auscultation bilaterally, no apparent distress ABDOMEN: soft, nontender NEURO: Pt is  awake/alert/appropriate, moves all extremitiesx4.  No facial droop.   EXTREMITIES: pulses normal/equal, full ROM, dialysis access left arm thrill noted SKIN: warm, color normal PSYCH: no abnormalities of mood noted, alert and oriented to situation  ED Treatments / Results  Labs (all labs ordered are listed, but only abnormal results are displayed) Labs Reviewed  BASIC METABOLIC PANEL - Abnormal; Notable for the following components:      Result Value   BUN 55 (*)    Creatinine, Ser 7.98 (*)    Calcium 8.7 (*)    GFR calc non Af Amer 4 (*)    GFR calc Af Amer 5 (*)      All other components within normal limits  CBC WITH DIFFERENTIAL/PLATELET - Abnormal; Notable for the following components:   RBC 3.30 (*)    Hemoglobin 11.5 (*)    MCV 110.9 (*)    MCH 34.8 (*)    All other components within normal limits  TROPONIN I - Abnormal; Notable for the following components:   Troponin I 0.05 (*)    All other components within normal limits  TROPONIN I  TROPONIN I    EKG EKG Interpretation  Date/Time:  Friday July 13 2018 00:50:59 EST Ventricular Rate:  89 PR Interval:    QRS Duration: 116 QT Interval:  409 QTC Calculation: 498 R Axis:   59 Text Interpretation:  Sinus rhythm Probable left atrial enlargement LVH with secondary repolarization abnormality   Confirmed by Ripley Fraise [id number]) on 07/13/2018 12:55:10 AM   Radiology Dg Chest 2 View  Result Date: 07/13/2018 CLINICAL DATA:  Chest pain EXAM: CHEST - 2 VIEW COMPARISON:  03/23/2018 FINDINGS: Pain mild cardiomegaly with prior CABG. Right-greater-than-left basilar atelectasis. No focal airspace consolidation or pulmonary edema. IMPRESSION: No active cardiopulmonary disease. Electronically Signed   By: Ulyses Jarred M.D.   On: 07/13/2018 02:09    Procedures Procedures (including critical care time)  Medications Ordered in ED Medications  ondansetron (ZOFRAN) injection 4 mg (has no administration in time range)  heparin injection 5,000 Units (5,000 Units Subcutaneous Refused 07/13/18 0549)     Initial Impression / Assessment and Plan / ED Course  I have reviewed the triage vital signs and the nursing notes.  Pertinent labs & imaging results that were available during my care of the patient were reviewed by me and considered in my medical decision making (see chart for details).     Patient with multiple medical conditions presents with chest pain.  She has longstanding history of CAD and ESRD.  She has had multiple ER evaluations previously.  Tonight's episode she reports is worse.  She  reports it did not improve nitroglycerin.  She has chronically elevated troponins.  Plan to admit for further work-up of her chest pain. She has already taken aspirin and nitroglycerin. Final Clinical Impressions(s) / ED Diagnoses   Final diagnoses:  Chest pain, rule out acute myocardial infarction    ED Discharge Orders    None       Ripley Fraise, MD 07/13/18 [phone]

## 2018-07-13 NOTE — H&P (View-Only) (Signed)
Cardiology Consult    Patient ID: Shawna [name]; [id number]; 06-Mar-1941   Admit date: 07/13/2018 Date of Consult: 07/13/2018  Primary Care Provider: [name], MD Primary Cardiologist: Kate Sable, MD    Patient Profile    TEKA CHANDA is a 78 y.o. female with past medical history of CAD (s/p CABG in 2013 with most recent DES to D1 in 10/2016 with cath showing patent LIMA-LAD, SVG-RI, SVG-Mrg and SVG-RCA, NST in 02/2018 showing scar with no significant ischemia), HTN, HLD, PAF (not on anticoagulation given history of GIB), NSVT (on Amiodarone) and ESRD who is being seen today for the evaluation of chest pain and EKG changes at the request of Dr. Clementeen Graham.   History of Present Illness    Ms. Mac was most recently admitted to [facility] in 02/2018 for evaluation of chest pain. Cyclic enzymes remained flat and peaked at 0.04 and echocardiogram showed a preserved EF of 55 to 60% with no regional wall motion abnormalities. She did undergo stress testing which showed a small region of decreased tracer activity in the mid/distal anterior/anterior septal wall consistent with scar but no significant ischemia.  She was last examined by Dr. Bronson Ing in 04/2018 and reported having an episode of chest discomfort the night prior which resolved with the use of sublingual nitroglycerin. She was continued on her current medication regimen and informed to follow-up in 6 months.  In talking with the patient today, she reports developing a "band-like sensation" along her entire chest occurring yesterday which was slightly worse with exertion. She utilized sublingual nitroglycerin intermittently with improvement in her symptoms. Upon going to bed last night, she developed a sharp pain which radiated into her back and down her left arm. Reports that symptoms resembled when she required stent placement in the past. She took 2 sublingual nitroglycerin with improvement in her symptoms but her pain was still  present, therefore she came to the ED for further evaluation. At the time of this encounter, she still reports having a discomfort along her left pectoral region. She denies any recent dyspnea on exertion, orthopnea, PND, lower extremity edema, or palpitations.  Initial labs show WBC 6.7, Hgb 11.5, platelets 155, Na+ 142, K+ 3.8, and creatinine 7.98. Initial troponin 0.05 with repeat value at 0.06. CXR shows no active cardiopulmonary disease. EKG shows normal sinus rhythm, heart rate 89, with LVH and ST depression along the inferior and lateral leads which is new when compared to prior tracings in 02/2018.   Past Medical History:  Diagnosis Date  . Acute on chronic respiratory failure with hypoxia ([place]) 10/10/2016  . Anxiety   . Arthritis   . AVM (arteriovenous malformation) of colon   . CAD (coronary artery disease)    a. s/p CABG in 2013 b. most recent DES to D1 in 10/2016 with cath showing patent LIMA-LAD, SVG-RI, SVG-Mrg and SVG-RCA  c. NST in 02/2018 showing scar with no significant ischemia  . Carotid artery disease ([place])    a. 74-08% LICA, 07/4479   . Chronic bronchitis ([place])   . Chronic diastolic CHF (congestive heart failure) ([place])    a. 02/2012 Echo EF 60-65%, nl wall motion, Gr 1 DD, mod MR  . Colon cancer ([place]) 1992  . Esophageal stricture   . ESRD on hemodialysis ([place])    ESRD due to HTN, started dialysis 2011 and gets HD at [facility] with Dr Hinda Lenis on MWF schedule.  Access is LUA AVF as of Sept 2014.   Marland Kitchen  GERD (gastroesophageal reflux disease)   . High cholesterol 12/2011  . History of blood transfusion 07/2011; 12/2011; 01/2012 X 2; 04/2012  . History of gout   . History of lower GI bleeding   . Hypertension   . Iron deficiency anemia   . Mitral regurgitation    a. Moderate by echo, 02/2012  . Myocardial infarction ([place])   . Ovarian cancer ([place]) 1992  . Pneumonia ~ 2009  . PUD (peptic ulcer disease)   . TIA (transient ischemic attack)     Past Surgical History:    Procedure Laterality Date  . ABDOMINAL HYSTERECTOMY  1992  . APPENDECTOMY  06/1990  . AV FISTULA PLACEMENT  07/2009   left upper arm  . AV FISTULA PLACEMENT Right 09/06/2016   Procedure: RIGHT FOREARM ARTERIOVENOUS (AV) GRAFT;  Surgeon: Elam Dutch, MD;  Location: [facility] OR;  Service: Vascular;  Laterality: Right;  . AV FISTULA PLACEMENT N/A 02/24/2017   Procedure: INSERTION OF ARTERIOVENOUS (AV) GORE-TEX GRAFT ARM (BRACHIAL AXILLARY);  Surgeon: Katha Cabal, MD;  Location: [facility] ORS;  Service: Vascular;  Laterality: N/A;  . [place] Right 09/06/2016   Procedure: REMOVAL OF Right Arm ARTERIOVENOUS GORETEX GRAFT and Vein Patch angioplasty of brachial artery;  Surgeon: Angelia Mould, MD;  Location: [place];  Service: Vascular;  Laterality: Right;  . COLON RESECTION  1992  . COLON SURGERY    . CORONARY ANGIOPLASTY WITH STENT PLACEMENT  12/15/11   "2"  . CORONARY ANGIOPLASTY WITH STENT PLACEMENT  y/2013   "1; makes total of 3" (05/02/2012)  . CORONARY ARTERY BYPASS GRAFT  06/13/2012   Procedure: CORONARY ARTERY BYPASS GRAFTING (CABG);  Surgeon: Grace Isaac, MD;  Location: [place];  Service: Open Heart Surgery;  Laterality: N/A;  cabg x four;  using left internal mammary artery, and left leg greater saphenous vein harvested endoscopically  . CORONARY STENT INTERVENTION N/A 10/13/2016   Procedure: Coronary Stent Intervention;  Surgeon: Troy Sine, MD;  Location: [place] CV LAB;  Service: Cardiovascular;  Laterality: N/A;  . DIALYSIS/PERMA CATHETER REMOVAL N/A 04/18/2017   Procedure: DIALYSIS/PERMA CATHETER REMOVAL;  Surgeon: Katha Cabal, MD;  Location: [place] CV LAB;  Service: Cardiovascular;  Laterality: N/A;  . DILATION AND CURETTAGE OF UTERUS    . ESOPHAGOGASTRODUODENOSCOPY  01/20/2012   Procedure: ESOPHAGOGASTRODUODENOSCOPY (EGD);  Surgeon: Ladene Artist, MD,FACG;  Location: [facility] ENDOSCOPY;  Service: Endoscopy;  Laterality: N/A;  . ESOPHAGOGASTRODUODENOSCOPY  N/A 03/26/2013   Procedure: ESOPHAGOGASTRODUODENOSCOPY (EGD);  Surgeon: Irene Shipper, MD;  Location: [facility] ENDOSCOPY;  Service: Endoscopy;  Laterality: N/A;  . ESOPHAGOGASTRODUODENOSCOPY N/A 04/30/2015   Procedure: ESOPHAGOGASTRODUODENOSCOPY (EGD);  Surgeon: Rogene Houston, MD;  [facility];  Service: Endoscopy;  Laterality: N/A;  1pm - moved to 10/20 @ 1:10  . ESOPHAGOGASTRODUODENOSCOPY N/A 07/29/2016   Procedure: ESOPHAGOGASTRODUODENOSCOPY (EGD);  Surgeon: Manus Gunning, MD;  Location: [place];  Service: Gastroenterology;  Laterality: N/A;  enteroscopy  . INTRAOPERATIVE TRANSESOPHAGEAL ECHOCARDIOGRAM  06/13/2012   Procedure: INTRAOPERATIVE TRANSESOPHAGEAL ECHOCARDIOGRAM;  Surgeon: Grace Isaac, MD;  Location: [place];  Service: Open Heart Surgery;  Laterality: N/A;  . IR GENERIC HISTORICAL  07/26/2016   IR FLUORO GUIDE CV LINE RIGHT 07/26/2016 Greggory Keen, MD [facility]-INTERV RAD  . IR GENERIC HISTORICAL  07/26/2016   IR US GUIDE VASC ACCESS RIGHT 07/26/2016 Greggory Keen, MD [facility]  . IR GENERIC HISTORICAL  08/02/2016   IR US GUIDE VASC ACCESS RIGHT 08/02/2016 Greggory Keen,  MD [facility]  . IR GENERIC HISTORICAL  08/02/2016   IR FLUORO GUIDE CV LINE RIGHT 08/02/2016 Greggory Keen, MD [facility]  . IR RADIOLOGY PERIPHERAL GUIDED IV START  03/28/2017  . IR US GUIDE VASC ACCESS RIGHT  03/28/2017  . LEFT HEART CATH AND CORONARY ANGIOGRAPHY N/A 09/20/2016   Procedure: Left Heart Cath and Coronary Angiography;  Surgeon: [name], MD;  Location: [place] CV LAB;  Service: Cardiovascular;  Laterality: N/A;  . LEFT HEART CATH AND CORS/GRAFTS ANGIOGRAPHY N/A 10/13/2016   Procedure: Left Heart Cath and Cors/Grafts Angiography;  Surgeon: Troy Sine, MD;  Location: [place] CV LAB;  Service: Cardiovascular;  Laterality: N/A;  . LEFT HEART CATHETERIZATION WITH CORONARY ANGIOGRAM N/A 12/15/2011   Procedure: LEFT HEART CATHETERIZATION WITH CORONARY ANGIOGRAM;  Surgeon:  Burnell Blanks, MD;  Location: [facility] CATH LAB;  Service: Cardiovascular;  Laterality: N/A;  . LEFT HEART CATHETERIZATION WITH CORONARY ANGIOGRAM N/A 01/10/2012   Procedure: LEFT HEART CATHETERIZATION WITH CORONARY ANGIOGRAM;  Surgeon: Peter M Martinique, MD;  Location: [facility] CATH LAB;  Service: Cardiovascular;  Laterality: N/A;  . LEFT HEART CATHETERIZATION WITH CORONARY ANGIOGRAM N/A 06/08/2012   Procedure: LEFT HEART CATHETERIZATION WITH CORONARY ANGIOGRAM;  Surgeon: Burnell Blanks, MD;  Location: [facility] CATH LAB;  Service: Cardiovascular;  Laterality: N/A;  . LEFT HEART CATHETERIZATION WITH CORONARY/GRAFT ANGIOGRAM N/A 12/10/2013   Procedure: LEFT HEART CATHETERIZATION WITH Beatrix Fetters;  Surgeon: [name], MD;  Location: [facility] CATH LAB;  Service: Cardiovascular;  Laterality: N/A;  . OVARY SURGERY     ovarian cancer  . REVISION OF ARTERIOVENOUS GORETEX GRAFT N/A 02/24/2017   Procedure: REVISION OF ARTERIOVENOUS GORETEX GRAFT (RESECTION);  Surgeon: Katha Cabal, MD;  Location: [facility] ORS;  Service: Vascular;  Laterality: N/A;  [place] N/A 10/15/2013   Procedure: Fistulogram;  Surgeon: Serafina Mitchell, MD;  Location: [facility] CATH LAB;  Service: Cardiovascular;  Laterality: N/A;  . THROMBECTOMY / ARTERIOVENOUS GRAFT REVISION  2011   left upper arm  . TUBAL LIGATION  1980's  . UPPER EXTREMITY ANGIOGRAPHY Bilateral 12/06/2016   Procedure: Upper Extremity Angiography;  Surgeon: Katha Cabal, MD;  Location: [place] CV LAB;  Service: Cardiovascular;  Laterality: Bilateral;  . UPPER EXTREMITY INTERVENTION Left 06/06/2017   Procedure: UPPER EXTREMITY INTERVENTION;  Surgeon: Katha Cabal, MD;  Location: [place] CV LAB;  Service: Cardiovascular;  Laterality: Left;     Home Medications:  Prior to Admission medications   Medication Sig Start Date End Date Taking? Authorizing Provider  albuterol (PROVENTIL) (2.5 MG/3ML) 0.083% nebulizer solution Inhale 3 mLs  into the lungs every 4 (four) hours as needed. 04/27/18  Yes [provider]  ALPRAZolam (XANAX) 0.25 MG tablet Take 1 tablet (0.25 mg total) by mouth 2 (two) times daily as needed for anxiety or sleep. 04/05/17  Yes Arrien, Jimmy Picket, MD  BRILINTA 60 MG TABS tablet TAKE 1 TABLET BY MOUTH TWICE DAILY 04/10/18  Yes Herminio Commons, MD  cinacalcet (SENSIPAR) 30 MG tablet Take 30 mg by mouth daily after supper.   Yes [provider]  cloNIDine (CATAPRES) 0.2 MG tablet Take 0.1-0.2 mg by mouth daily as needed (high blood pressure).    Yes [provider]  diphenoxylate-atropine (LOMOTIL) 2.5-0.025 MG tablet Take 1 tablet by mouth 4 (four) times daily as needed for diarrhea or loose stools. 08/13/17  Yes Tanna Furry, MD  ferrous sulfate 325 (65 FE) MG EC tablet Take 1 tablet (325  mg total) by mouth 2 (two) times daily with a meal. Patient taking differently: Take 325 mg by mouth daily with breakfast.  07/15/17 07/13/18 Yes Johnson, Clanford L, MD  fluticasone (FLONASE) 50 MCG/ACT nasal spray Place 1 spray at bedtime as needed into both nostrils for allergies.    Yes [provider]  hydrALAZINE (APRESOLINE) 50 MG tablet Take 75 mg by mouth 2 (two) times daily as needed.    Yes [provider]  isosorbide mononitrate (IMDUR) 120 MG 24 hr tablet TAKE 1 TABLET BY MOUTH ONCE DAILY 07/05/18  Yes Herminio Commons, MD  multivitamin (RENA-VIT) TABS tablet Take 1 tablet by mouth daily.   Yes [provider]  nystatin-triamcinolone (MYCOLOG II) cream Apply to the affected area twice a day 06/29/18  Yes Rehman, Mechele Dawley, MD  omeprazole (PRILOSEC) 20 MG capsule Take 1 capsule (20 mg total) by mouth daily. 11/21/17  Yes Rehman, Mechele Dawley, MD  ondansetron (ZOFRAN-ODT) 4 MG disintegrating tablet Take 4 mg by mouth every 8 (eight) hours as needed for nausea or vomiting.  07/12/17  Yes [provider]  sevelamer carbonate (RENVELA) 800 MG tablet Take  800-2,400 mg by mouth See admin instructions. Take 3 tablets (2400) by mouth twice daily with meals, take 1 tablet (800 mg) with snacks   Yes [provider]  simvastatin (ZOCOR) 20 MG tablet TAKE ONE TABLET (20 MG) BY MOUTH DAILY AT BEDTIME 04/03/18  Yes Herminio Commons, MD  ticagrelor (BRILINTA) 60 MG TABS tablet Take 1 tablet (60 mg total) by mouth 2 (two) times daily. 03/19/18  Yes Herminio Commons, MD  bisacodyl (DULCOLAX) 10 MG suppository Place 1 suppository (10 mg total) rectally as needed for moderate constipation. 11/21/17   Rehman, Mechele Dawley, MD  carvedilol (COREG) 6.25 MG tablet TAKE 1 TABLET BY MOUTH TWICE DAILY WITH  A  MEAL Patient taking differently: TAKE 1 TABLET (6.25 MG) BY MOUTH TWICE DAILY WITH  A  MEAL 06/15/17   Herminio Commons, MD  diphenhydrAMINE (BENADRYL) 25 mg capsule Take 50 mg by mouth daily as needed for itching or allergies.     [provider]  epoetin alfa (EPOGEN,PROCRIT) 62703 UNIT/ML injection Inject 1 mL (10,000 Units total) into the vein every Monday, Wednesday, and Friday with hemodialysis. 04/11/16   Orvan Falconer, MD  lidocaine-prilocaine (EMLA) cream Apply 1 application topically every Monday, Wednesday, and Friday. Prior to dialysis    [provider]  nitroGLYCERIN (NITROSTAT) 0.4 MG SL tablet Place 1 tablet (0.4 mg total) under the tongue every 5 (five) minutes as needed for chest pain. 06/05/17   Herminio Commons, MD    Inpatient Medications: Scheduled Meds: . carvedilol  6.25 mg Oral BID WC  . cinacalcet  30 mg Oral QPC supper  . cloNIDine  0.1 mg Oral Daily  . epoetin alfa  10,000 Units Intravenous Q M,W,F-HD  . escitalopram  10 mg Oral Daily  . heparin  5,000 Units Subcutaneous Q8H  . hydrALAZINE  75 mg Oral BID  . isosorbide mononitrate  120 mg Oral Daily  . lidocaine-prilocaine  1 application Topical Q M,W,F  . multivitamin  1 tablet Oral Daily  . pantoprazole  40 mg Oral Daily  . sevelamer carbonate  2,400  mg Oral BID WC  . sevelamer carbonate  800 mg Oral With snacks  . simvastatin  20 mg Oral q1800  . ticagrelor  60 mg Oral BID   Continuous Infusions:  PRN Meds: ALPRAZolam, bisacodyl,  diphenhydrAMINE, diphenoxylate-atropine, fluticasone, hydrALAZINE, loratadine, nitroGLYCERIN, ondansetron (ZOFRAN) IV  Allergies:    Allergies  Allergen Reactions  . Aspirin Other (See Comments)    High Doses Mess up her stomach; "makes my bowels have blood in them". Takes 81 mg EC Aspirin   . Penicillins Other (See Comments)    SYNCOPE? , "makes me real weak when I take it; like I'll pass out"  Has patient had a PCN reaction causing immediate rash, facial/tongue/throat swelling, SOB or lightheadedness with hypotension: Yes Has patient had a PCN reaction causing severe rash involving mucus membranes or skin necrosis: no Has patient had a PCN reaction that required hospitalization no Has patient had a PCN reaction occurring within the last 10 years: no If all of the above  . Amlodipine Swelling  . Bactrim [Sulfamethoxazole-Trimethoprim] Rash  . Contrast Media [Iodinated Diagnostic Agents] Itching  . Iron Itching and Other (See Comments)    "they gave me iron in dialysis; had to give me Benadryl cause I had to have the iron" (05/02/2012)  . Nitrofurantoin Hives  . Tylenol [Acetaminophen] Itching and Other (See Comments)    Makes her feet on fire per pt  . Gabapentin Other (See Comments)    Unknown reaction  . Dexilant [Dexlansoprazole] Other (See Comments)    Upset stomach  . Levaquin [Levofloxacin In D5w] Rash  . Morphine And Related Itching and Other (See Comments)    Itching in feet  . Plavix [Clopidogrel Bisulfate] Rash  . Protonix [Pantoprazole Sodium] Rash  . Venofer [Ferric Oxide] Itching and Other (See Comments)    Patient reports using Benadryl prior to doses as [place] History:   Social History   Socioeconomic History  . Marital status: Married    Spouse name:  Not on file  . Number of children: Not on file  . Years of education: Not on file  . Highest education level: Not on file  Occupational History  . Not on file  Social Needs  . Financial resource strain: Not on file  . Food insecurity:    Worry: Not on file    Inability: Not on file  . Transportation needs:    Medical: Not on file    Non-medical: Not on file  Tobacco Use  . Smoking status: Never Smoker  . Smokeless tobacco: Never Used  Substance and Sexual Activity  . Alcohol use: No    Alcohol/week: 0.0 standard drinks  . Drug use: No  . Sexual activity: Yes    Birth control/protection: Surgical  Lifestyle  . Physical activity:    Days per week: Not on file    Minutes per session: Not on file  . Stress: Not on file  Relationships  . Social connections:    Talks on phone: Not on file    Gets together: Not on file    Attends religious service: Not on file    Active member of club or organization: Not on file    Attends meetings of clubs or organizations: Not on file    Relationship status: Not on file  . Intimate partner violence:    Fear of current or ex partner: Not on file    Emotionally abused: Not on file    Physically abused: Not on file    Forced sexual activity: Not on file  Other Topics Concern  . Not on file  Social History Narrative   Lives in [place], [place] with husband.  Dialysis pt - mwf.  Family History:    Family History  Problem Relation Age of Onset  . Heart disease Mother        Heart Disease before age 75  . Hyperlipidemia Mother   . Hypertension Mother   . Diabetes Mother   . Heart attack Mother   . Heart disease Father        Heart Disease before age 22  . Hyperlipidemia Father   . Hypertension Father   . Diabetes Father   . Diabetes Sister   . Hypertension Sister   . Diabetes Brother   . Hyperlipidemia Brother   . Heart attack Brother   . Hypertension Sister   . Heart attack Brother   . Other Other        noncontributory for  early CAD  . Colon cancer Neg Hx   . Esophageal cancer Neg Hx   . Liver disease Neg Hx   . Kidney disease Neg Hx   . Colon polyps Neg Hx       Review of Systems    General:  No chills, fever, night sweats or weight changes.  Cardiovascular:  No dyspnea on exertion, edema, orthopnea, palpitations, paroxysmal nocturnal dyspnea. Positive for chest pain.  Dermatological: No rash, lesions/masses Respiratory: No cough, dyspnea Urologic: No hematuria, dysuria Abdominal:   No nausea, vomiting, diarrhea, bright red blood per rectum, melena, or hematemesis Neurologic:  No visual changes, wkns, changes in mental status. All other systems reviewed and are otherwise negative except as noted above.  Physical Exam/Data    Vitals:   07/13/18 0900 07/13/18 0930 07/13/18 1045 07/13/18 1121  BP: (!) 181/65 (!) 172/66 (!) 175/67 (!) 177/62  Pulse:   70 66  Resp: 15 (!) 21 16 18   Temp:    97.6 F (36.4 C)  TempSrc:    Oral  SpO2:   100% 100%  Weight:      Height:       No intake or output data in the 24 hours ending 07/13/18 1127 Filed Weights   07/13/18 0055  Weight: 54.9 kg   Body mass index is 22.86 kg/m.   General: Pleasant, elderly African American female appearing in NAD Psych: Normal affect. Neuro: Alert and oriented X 3. Moves all extremities spontaneously. HEENT: Normal  Neck: Supple without bruits or JVD. Lungs:  Resp regular and unlabored, CTA without wheezing or rales. Heart: RRR with occasional ectopic beats. No s3, s4, or murmurs. Abdomen: Soft, non-tender, non-distended, BS + x 4.  Extremities: No clubbing, cyanosis or edema. DP/PT/Radials 2+ and equal bilaterally.   Labs/Studies     Relevant CV Studies:  Echocardiogram: 02/2018 Study Conclusions  - Left ventricle: The cavity size was normal. Wall thickness was   increased in a pattern of mild LVH. Systolic function was normal.   The estimated ejection fraction was in the range of 55% to 60%.   Wall motion  was normal; there were no regional wall motion   abnormalities. Features are consistent with a pseudonormal left   ventricular filling pattern, with concomitant abnormal relaxation   and increased filling pressure (grade 2 diastolic dysfunction).   Doppler parameters are consistent with high ventricular filling   pressure. - Aortic valve: Mildly to moderately calcified annulus. Trileaflet;   moderately thickened leaflets. Valve area (VTI): 1.88 cm^2. Valve   area (Vmax): 1.68 cm^2. - Mitral valve: Mildly to moderately calcified annulus. Mildly   thickened leaflets . There was mild regurgitation. - Left atrium: The atrium was severely dilated. -  Right ventricle: The cavity size was mildly dilated. Systolic   function was mildly reduced. - Right atrium: The atrium was severely dilated. - Atrial septum: No defect or patent foramen ovale was identified. - Tricuspid valve: There was moderate regurgitation. - Pulmonary arteries: Systolic pressure was moderately increased.   PA peak pressure: 56 mm Hg (S). - Technically adequate study.  NST: 02/2018  EKG changes are nondiagnostic due to baseline changes  Small region of dcreased tracer activity in the mid/distal anferior/anteorseptal wall consistent with scar. No significant ischemia  This is an intermediate risk study.  Cardiac Catheterization: 10/2016  Ramus lesion, 90 %stenosed.  Dist LAD lesion, 80 %stenosed.  LIMA.  SVG.  Ost RCA to Prox RCA lesion, 100 %stenosed.  SVG.  Ost Cx to Prox Cx lesion, 90 %stenosed.  Mid LAD lesion, 75 %stenosed.  A STENT RESOLUTE ONYX 6.19J09 drug eluting stent was successfully placed, and overlaps previously placed stent.  Ost 1st Diag to 1st Diag lesion, 99 %stenosed.  Post intervention, there is a 0% residual stenosis.  1st Diag lesion, 20 %stenosed.   Severe multivessel native CAD with 99.9% ostial proximal diagonal 1 stenosis in a vessel which had been previously stented and now  has TIMI 1 flow, 85% LAD stenosis immediately after the takeoff of this first diagonal vessel and 80% mid LAD stenosis just proximal to the insertion of the LIMA graft.  Ramus intermediate 90% ostial stenosis with competitive filling due a patent SVG.  Ostial Left circumflex 90% in-stent restenosis and occlusion of a marginal branch beyond the stented segment.  Total occlusion of a stent at the ostium of the RCA.  Patent LIMA graft supplying the mid distal LAD with insertion beyond the mid 80% stenosis.  Patent SVG supplying the ramus intermediate vessel with 30% narrowing just beyond the anastomosis.  Patent SVG supplying the distal RCA which fills the entire RCA distally and antegrade up to the occluded ostial stent.  Successful percutaneous coronary intervention to the subtotally occluded diagonal vessel with the 99.9%  stenosis being reduced to 0% with PTCA and insertion of a 2.2515 mm Resolute Onyx DES stent into the previously placed stent post dilated to 2.45 mm and with the stenosis being reduced to 0% in the entire stented segment.  The sluggish TIMI-1 flow at the start of the procedure was improved to brisk TIMI-3 flow in the diagonal vessel following PCI. The LAD stenosis immediately after the diagonal vessel was 80% stenosed at the completion of the procedure.  RECOMMENDATION: Brillinta was used for antiplatelet therapy during this procedure due to the patient's reported rash to Plavix.  She will need to continue dual antiplatelet therapy.  Medical therapy for concomitant CAD.  Laboratory Data:  Chemistry Recent Labs  Lab 07/13/18 0203  NA 142  K 3.8  CL 101  CO2 29  GLUCOSE 81  BUN 55*  CREATININE 7.98*  CALCIUM 8.7*  GFRNONAA 4*  GFRAA 5*  ANIONGAP 12    No results for input(s): PROT, ALBUMIN, AST, ALT, ALKPHOS, BILITOT in the last 168 hours. Hematology Recent Labs  Lab 07/13/18 0203  WBC 6.7  RBC 3.30*  HGB 11.5*  HCT 36.6  MCV 110.9*  MCH 34.8*    MCHC 31.4  RDW 14.3  PLT 155   Cardiac Enzymes Recent Labs  Lab 07/13/18 0203 07/13/18 0836  TROPONINI 0.05* 0.06*   No results for input(s): TROPIPOC in the last 168 hours.  BNPNo results for input(s): BNP, PROBNP in the last 168  hours.  DDimer No results for input(s): DDIMER in the last 168 hours.  Radiology/Studies:  Dg Chest 2 View  Result Date: 07/13/2018 CLINICAL DATA:  Chest pain EXAM: CHEST - 2 VIEW COMPARISON:  03/23/2018 FINDINGS: Pain mild cardiomegaly with prior CABG. Right-greater-than-left basilar atelectasis. No focal airspace consolidation or pulmonary edema. IMPRESSION: No active cardiopulmonary disease. Electronically Signed   By: Ulyses Jarred M.D.   On: 07/13/2018 02:09     Assessment & Plan    1. Chest Pain in the setting of Known CAD - the patient is s/p CABG in 2013 with most recent DES to D1 in 10/2016 with cath showing patent LIMA-LAD, SVG-RI, SVG-Mrg and SVG-RCA as outlined above. Underwent recent NST in 02/2018 showing scar with no significant ischemia. - presents with worsening chest discomfort which started yesterday and was worse with exertion and relieved with SL NTG. Pain radiated into her left arm and resembled her prior angina. Had been having intermittent episodes of chest pain since her last admission but nothing as severe as her presenting symptoms.  - Initial troponin 0.05 with repeat value at 0.06. EKG shows LVH with ST depression along the inferior and lateral leads which is new when compared to prior tracings in 02/2018. Still having pain at this time per her report. Will obtain a repeat EKG.  - given her presenting symptoms and EKG changes, would anticipate a repeat cardiac catheterization this admission. Will review with Dr. Harrington Challenger. She did consume breakfast around 0900 so would need to be later today if able to be added to the schedule. Will need to coordinate timing of HD with Nephrology.  - continue ASA, Brilinta, BB, and statin therapy.   2.  HTN - BP has been elevated at 160/62 - 194/78 while in the ED. She just recently received her a.m. Coreg and is scheduled to receive Imdur and Hydralazine at this time.  3. HLD - FLP in 10/2017 showed LDL at 26.  She remains on Simvastatin 20mg  daily.   4. History of PAF/NSVT - She denies any recent palpitations and is maintaining normal sinus rhythm by review of telemetry.  She is not on anticoagulation given history of a GI bleed. Has remained on Amiodarone 200 mg daily for episodes of NSVT. LFT's within normal limits when checked in 04/2018 with TSH slightly elevated at 5.5. Continue to follow thyroid function given the use of amiodarone.  5. ESRD - on HD - Nephrology consulted by the admitting team. Undergoes HD on MWF schedule. If requiring a catheterization today, would likely need to have HD tomorrow morning.    For questions or updates, please contact [place] Please consult [email or web address] for contact info under Cardiology/STEMI.  Signed, Erma Heritage, PA-C 07/13/2018, 11:27 AM Pager: [phone]   Patient seen and examined   I agree with findings as noted by B Strader above Pt is a 78 yo with known CAD (s/p CABG in 2013; DES stent to D1 in April 2018.   Pt with ESRD , undergoes hemodialysis    Yesterday she was at home when she developed band like tightness across chest   Not worse with breathing    Dialysis has been unchanged  Last night she developed pain in L back that radieated to chest and down arme   Arm portion she has had with angina in past    Pt had symtpoms earlier today and they have eased some   On exam: JVP is increased  Lungs are CTA  Cardiac   RRR S1, S2  No S3 Abd is benign Ext are without edema  EKG shows SR and LVH with strain pattern   ST segment changes have been intermitt in past   Symptoms of tightness are not like her reflux    She has some probable mild volume increase but not bad.   No evid for lung infection or  inflammation Cncerning therefore for angina.   Given ongoing need for dialysis I would recomm L heart cath to define anatomy Pt will have dialysis after  Pt understands and agrees to proceed   Will tx to [facility] today .  Dorris Carnes

## 2018-07-13 NOTE — H&P (Signed)
TRH H&P   Patient Demographics:    Shawna [name], is a 78 y.o. female  MRN: [id number]   DOB - [birth year]  Admit Date - 07/13/2018  Outpatient Primary MD for the patient is [name], MD  Referring MD: Dr. [name]  Outpatient Specialists: Dr. Bronson Ing (cardiology)  Patient coming from: Home  Chief Complaint  Patient presents with  . Chest Pain      HPI:    Shawna [name]  is a 78 y.o. female, with extensive cardiac history including CAD with NSTEMI in April 2018 and underwent percutaneous coronary intervention to a subtotally occluded diagonal vessel with a drug-eluting stent, CABG in 3903, chronic diastolic CHF, ESRD on hemodialysis M, W, F; AVM of colon and colon cancer status post resection, arthritis, carotid artery disease, GERD, hyperlipidemia who presented to the ED with acute onset of chest pain.  Patient reports she was about to go to bed when she developed sharp pain in the back of her left shoulder that started radiating down to her left arm and squeezing pressure-like pain across her chest lasting for several minutes.  She took a nitroglycerin and the pain eased off a little and try to go to bed however about an hour later she again developed similar severe pain across the chest 10/10 in severity lasting for several minutes.  She took full dose aspirin and to further sublingual nitrate and came to the ED. In the ED her pain had started to subside but reported soreness across the chest.  She denies any shortness of breath, orthopnea, PND, palpitations, trauma to the chest or lifting heavy weight.  Denies any headache, dizziness, blurred vision, fevers, chills, nausea, vomiting, abdominal pain, dysuria, diarrhea, muscle weakness, tingling or numbness.  Denies any sick contact or recent travel.  She reports being adherent to medications and has not missed her dialysis.  Course  in the ED Patient was hypertensive, remaining vitals were stable.  Blood work showed hemoglobin of 11.5, BUN of 55 and creatinine of 7.98.  Initial troponin was mildly elevated at 0.05.  EKG showed normal sinus rhythm at 89 with LVH and new ST depression in inferior leads, V5 and V6.  Chest x-ray negative for any infiltrate or pulmonary congestion. Hospitalist consulted for observation for chest pain symptoms.   Review of systems:    In addition to the HPI above,  No Fever-chills, No Headache, No changes with Vision or hearing, No problems swallowing food or Liquids, Chest pain + + +,  cough or Shortness of Breath, No Abdominal pain, No Nausea or vomiting, Bowel movements are regular, No Blood in stool or Urine, No dysuria, No new skin rashes or bruises, No new joints pains-aches,  No new weakness, tingling, numbness in any extremity, No recent weight gain or loss, No polyuria, polydypsia or polyphagia, No significant Mental Stressors.     With Past History  of the following :    Past Medical History:  Diagnosis Date  . Acute on chronic respiratory failure with hypoxia ([place]) 10/10/2016  . Anxiety   . Arthritis   . AVM (arteriovenous malformation) of colon   . CAD (coronary artery disease)    a. 12/2011 NSTEMI/Cath/PCI LCX (2.25x14 Resolute DES) & D1 (2.25x22 Resolute DES);  b. 01/2012 Cath/PCI: LM 30, LAD 30p, 40-27m, D1 stent ok, 99 in sm branch of diag, LCX patent stent, OM1 20, RCA 95 ost (4.0x12 Promus DES), EF 55%;  c. 04/2012 Lexi Cardiolite  EF 48%, small area of scar @ base/mid inflat wall with mild peri-infarct ischemia.; CABG 12/4  . Carotid artery disease ([place])    a. 09-73% LICA, 11/3297   . Chronic bronchitis ([place])   . Chronic diastolic CHF (congestive heart failure) ([place])    a. 02/2012 Echo EF 60-65%, nl wall motion, Gr 1 DD, mod MR  . Colon cancer ([place]) 1992  . Esophageal stricture   . ESRD on hemodialysis ([place])    ESRD due to HTN, started dialysis 2011 and gets HD  at [facility] with Dr Hinda Lenis on MWF schedule.  Access is LUA AVF as of Sept 2014.   Marland Kitchen GERD (gastroesophageal reflux disease)   . High cholesterol 12/2011  . History of blood transfusion 07/2011; 12/2011; 01/2012 X 2; 04/2012  . History of gout   . History of lower GI bleeding   . Hypertension   . Iron deficiency anemia   . Mitral regurgitation    a. Moderate by echo, 02/2012  . Myocardial infarction ([place])   . Ovarian cancer ([place]) 1992  . Pneumonia ~ 2009  . PUD (peptic ulcer disease)   . TIA (transient ischemic attack)       Past Surgical History:  Procedure Laterality Date  . ABDOMINAL HYSTERECTOMY  1992  . APPENDECTOMY  06/1990  . AV FISTULA PLACEMENT  07/2009   left upper arm  . AV FISTULA PLACEMENT Right 09/06/2016   Procedure: RIGHT FOREARM ARTERIOVENOUS (AV) GRAFT;  Surgeon: Elam Dutch, MD;  Location: [facility] OR;  Service: Vascular;  Laterality: Right;  . AV FISTULA PLACEMENT N/A 02/24/2017   Procedure: INSERTION OF ARTERIOVENOUS (AV) GORE-TEX GRAFT ARM (BRACHIAL AXILLARY);  Surgeon: Katha Cabal, MD;  Location: [facility] ORS;  Service: Vascular;  Laterality: N/A;  . [place] Right 09/06/2016   Procedure: REMOVAL OF Right Arm ARTERIOVENOUS GORETEX GRAFT and Vein Patch angioplasty of brachial artery;  Surgeon: Angelia Mould, MD;  Location: [place];  Service: Vascular;  Laterality: Right;  . COLON RESECTION  1992  . COLON SURGERY    . CORONARY ANGIOPLASTY WITH STENT PLACEMENT  12/15/11   "2"  . CORONARY ANGIOPLASTY WITH STENT PLACEMENT  y/2013   "1; makes total of 3" (05/02/2012)  . CORONARY ARTERY BYPASS GRAFT  06/13/2012   Procedure: CORONARY ARTERY BYPASS GRAFTING (CABG);  Surgeon: Grace Isaac, MD;  Location: [place];  Service: Open Heart Surgery;  Laterality: N/A;  cabg x four;  using left internal mammary artery, and left leg greater saphenous vein harvested endoscopically  . CORONARY STENT INTERVENTION N/A 10/13/2016   Procedure: Coronary Stent Intervention;   Surgeon: Troy Sine, MD;  Location: [name] CV LAB;  Service: Cardiovascular;  Laterality: N/A;  . DIALYSIS/PERMA CATHETER REMOVAL N/A 04/18/2017   Procedure: DIALYSIS/PERMA CATHETER REMOVAL;  Surgeon: Katha Cabal, MD;  Location: [place] CV LAB;  Service: Cardiovascular;  Laterality: N/A;  . DILATION AND  CURETTAGE OF UTERUS    . ESOPHAGOGASTRODUODENOSCOPY  01/20/2012   Procedure: ESOPHAGOGASTRODUODENOSCOPY (EGD);  Surgeon: Ladene Artist, MD,FACG;  Location[facility] ENDOSCOPY;  Service: Endoscopy;  Laterality: N/A;  . ESOPHAGOGASTRODUODENOSCOPY N/A 03/26/2013   Procedure: ESOPHAGOGASTRODUODENOSCOPY (EGD);  Surgeon: Irene Shipper, MD;  Location: [facility] ENDOSCOPY;  Service: Endoscopy;  Laterality: N/A;  . ESOPHAGOGASTRODUODENOSCOPY N/A 04/30/2015   Procedure: ESOPHAGOGASTRODUODENOSCOPY (EGD);  Surgeon: Rogene Houston, MD;  [facility];  Service: Endoscopy;  Laterality: N/A;  1pm - moved to 10/20 @ 1:10  . ESOPHAGOGASTRODUODENOSCOPY N/A 07/29/2016   Procedure: ESOPHAGOGASTRODUODENOSCOPY (EGD);  Surgeon: Manus Gunning, MD;  Location: [place];  Service: Gastroenterology;  Laterality: N/A;  enteroscopy  . INTRAOPERATIVE TRANSESOPHAGEAL ECHOCARDIOGRAM  06/13/2012   Procedure: INTRAOPERATIVE TRANSESOPHAGEAL ECHOCARDIOGRAM;  Surgeon: Grace Isaac, MD;  Location: [place];  Service: Open Heart Surgery;  Laterality: N/A;  . IR GENERIC HISTORICAL  07/26/2016   IR FLUORO GUIDE CV LINE RIGHT 07/26/2016 Greggory Keen, MD [facility]  . IR GENERIC HISTORICAL  07/26/2016   IR US GUIDE VASC ACCESS RIGHT 07/26/2016 Greggory Keen, MD [facility]  . IR GENERIC HISTORICAL  08/02/2016   IR US GUIDE VASC ACCESS RIGHT 08/02/2016 Greggory Keen, MD [facility]  . IR GENERIC HISTORICAL  08/02/2016   IR FLUORO GUIDE CV LINE RIGHT 08/02/2016 Greggory Keen, MD [facility]  . IR RADIOLOGY PERIPHERAL GUIDED IV START  03/28/2017  . IR US GUIDE VASC ACCESS RIGHT  03/28/2017  . LEFT HEART CATH  AND CORONARY ANGIOGRAPHY N/A 09/20/2016   Procedure: Left Heart Cath and Coronary Angiography;  Surgeon: [name], MD;  Location: [place] CV LAB;  Service: Cardiovascular;  Laterality: N/A;  . LEFT HEART CATH AND CORS/GRAFTS ANGIOGRAPHY N/A 10/13/2016   Procedure: Left Heart Cath and Cors/Grafts Angiography;  Surgeon: Troy Sine, MD;  Location: [place] CV LAB;  Service: Cardiovascular;  Laterality: N/A;  . LEFT HEART CATHETERIZATION WITH CORONARY ANGIOGRAM N/A 12/15/2011   Procedure: LEFT HEART CATHETERIZATION WITH CORONARY ANGIOGRAM;  Surgeon: Burnell Blanks, MD;  Location: [facility] CATH LAB;  Service: Cardiovascular;  Laterality: N/A;  . LEFT HEART CATHETERIZATION WITH CORONARY ANGIOGRAM N/A 01/10/2012   Procedure: LEFT HEART CATHETERIZATION WITH CORONARY ANGIOGRAM;  Surgeon: Peter M Martinique, MD;  Location: [facility] CATH LAB;  Service: Cardiovascular;  Laterality: N/A;  . LEFT HEART CATHETERIZATION WITH CORONARY ANGIOGRAM N/A 06/08/2012   Procedure: LEFT HEART CATHETERIZATION WITH CORONARY ANGIOGRAM;  Surgeon: Burnell Blanks, MD;  Location: [facility] CATH LAB;  Service: Cardiovascular;  Laterality: N/A;  . LEFT HEART CATHETERIZATION WITH CORONARY/GRAFT ANGIOGRAM N/A 12/10/2013   Procedure: LEFT HEART CATHETERIZATION WITH Beatrix Fetters;  Surgeon: [name], MD;  Location: [facility] CATH LAB;  Service: Cardiovascular;  Laterality: N/A;  . OVARY SURGERY     ovarian cancer  . REVISION OF ARTERIOVENOUS GORETEX GRAFT N/A 02/24/2017   Procedure: REVISION OF ARTERIOVENOUS GORETEX GRAFT (RESECTION);  Surgeon: Katha Cabal, MD;  Location: [facility] ORS;  Service: Vascular;  Laterality: N/A;  Earney Mallet N/A 10/15/2013   Procedure: Fistulogram;  Surgeon: Serafina Mitchell, MD;  Location: [facility] CATH LAB;  Service: Cardiovascular;  Laterality: N/A;  . THROMBECTOMY / ARTERIOVENOUS GRAFT REVISION  2011   left upper arm  . TUBAL LIGATION  1980's  . UPPER EXTREMITY ANGIOGRAPHY Bilateral 12/06/2016     Procedure: Upper Extremity Angiography;  Surgeon: Katha Cabal, MD;  Location: [place] CV LAB;  Service: Cardiovascular;  Laterality: Bilateral;  . UPPER  EXTREMITY INTERVENTION Left 06/06/2017   Procedure: UPPER EXTREMITY INTERVENTION;  Surgeon: Katha Cabal, MD;  Location: [place] CV LAB;  Service: Cardiovascular;  Laterality: Left;      Social History:     Social History   Tobacco Use  . Smoking status: Never Smoker  . Smokeless tobacco: Never Used  Substance Use Topics  . Alcohol use: No    Alcohol/week: 0.0 standard drinks     Lives -home with husband  Mobility -independent     Family History :     Family History  Problem Relation Age of Onset  . Heart disease Mother        Heart Disease before age 16  . Hyperlipidemia Mother   . Hypertension Mother   . Diabetes Mother   . Heart attack Mother   . Heart disease Father        Heart Disease before age 53  . Hyperlipidemia Father   . Hypertension Father   . Diabetes Father   . Diabetes Sister   . Hypertension Sister   . Diabetes Brother   . Hyperlipidemia Brother   . Heart attack Brother   . Hypertension Sister   . Heart attack Brother   . Other Other        noncontributory for early CAD  . Colon cancer Neg Hx   . Esophageal cancer Neg Hx   . Liver disease Neg Hx   . Kidney disease Neg Hx   . Colon polyps Neg Hx       Home Medications:   Prior to Admission medications   Medication Sig Start Date End Date Taking? Authorizing Provider  ALPRAZolam (XANAX) 0.25 MG tablet Take 1 tablet (0.25 mg total) by mouth 2 (two) times daily as needed for anxiety or sleep. 04/05/17   Arrien, Jimmy Picket, MD  aspirin EC 81 MG tablet Take 81 mg by mouth once. FOR CHEST PAIN    [provider]  bisacodyl (DULCOLAX) 10 MG suppository Place 1 suppository (10 mg total) rectally as needed for moderate constipation. 11/21/17   Rehman, Mechele Dawley, MD  BRILINTA 60 MG TABS tablet TAKE 1 TABLET  BY MOUTH TWICE DAILY 04/10/18   Herminio Commons, MD  carvedilol (COREG) 6.25 MG tablet TAKE 1 TABLET BY MOUTH TWICE DAILY WITH  A  MEAL Patient taking differently: TAKE 1 TABLET (6.25 MG) BY MOUTH TWICE DAILY WITH  A  MEAL 06/15/17   Herminio Commons, MD  cinacalcet (SENSIPAR) 30 MG tablet Take 30 mg by mouth daily after supper.    [provider]  cloNIDine (CATAPRES) 0.2 MG tablet Take 0.1-0.2 mg by mouth daily as needed (high blood pressure).     [provider]  diphenhydrAMINE (BENADRYL) 25 mg capsule Take 50 mg by mouth daily as needed for itching or allergies.     [provider]  diphenoxylate-atropine (LOMOTIL) 2.5-0.025 MG tablet Take 1 tablet by mouth 4 (four) times daily as needed for diarrhea or loose stools. 08/13/17   Tanna Furry, MD  epoetin alfa (EPOGEN,PROCRIT) 17510 UNIT/ML injection Inject 1 mL (10,000 Units total) into the vein every Monday, Wednesday, and Friday with hemodialysis. 04/11/16   Orvan Falconer, MD  escitalopram (LEXAPRO) 10 MG tablet  11/16/17   [provider]  ferrous sulfate 325 (65 FE) MG EC tablet Take 1 tablet (325 mg total) by mouth 2 (two) times daily with a meal. Patient taking differently: Take 325 mg by mouth daily with breakfast.  07/15/17  05/10/18  Johnson, Clanford L, MD  fluticasone (FLONASE) 50 MCG/ACT nasal spray Place 1 spray at bedtime as needed into both nostrils for allergies.     [provider]  hydrALAZINE (APRESOLINE) 50 MG tablet Take 75 mg by mouth 2 (two) times daily.    [provider]  isosorbide mononitrate (IMDUR) 120 MG 24 hr tablet TAKE 1 TABLET BY MOUTH ONCE DAILY 07/05/18   Herminio Commons, MD  lanthanum (FOSRENOL) 1000 MG chewable tablet Chew by mouth. 04/29/11   [provider]  lidocaine-prilocaine (EMLA) cream Apply 1 application topically every Monday, Wednesday, and Friday. Prior to dialysis    [provider]  loratadine (CLARITIN) 10 MG tablet Take 10  mg by mouth daily as needed for allergies.    [provider]  multivitamin (RENA-VIT) TABS tablet Take 1 tablet by mouth daily.    [provider]  nitroGLYCERIN (NITROSTAT) 0.4 MG SL tablet Place 1 tablet (0.4 mg total) under the tongue every 5 (five) minutes as needed for chest pain. 06/05/17   Herminio Commons, MD  nystatin-triamcinolone (MYCOLOG II) cream Apply to the affected area twice a day 06/29/18   Rogene Houston, MD  omeprazole (PRILOSEC) 20 MG capsule Take 1 capsule (20 mg total) by mouth daily. 11/21/17   Rehman, Mechele Dawley, MD  ondansetron (ZOFRAN-ODT) 4 MG disintegrating tablet Take 4 mg by mouth every 8 (eight) hours as needed for nausea or vomiting.  07/12/17   [provider]  sevelamer carbonate (RENVELA) 800 MG tablet Take 800-2,400 mg by mouth See admin instructions. Take 3 tablets (2400) by mouth twice daily with meals, take 1 tablet (800 mg) with snacks    [provider]  simvastatin (ZOCOR) 20 MG tablet TAKE ONE TABLET (20 MG) BY MOUTH DAILY AT BEDTIME 04/03/18   Herminio Commons, MD  ticagrelor (BRILINTA) 60 MG TABS tablet Take 1 tablet (60 mg total) by mouth 2 (two) times daily. 03/19/18   Herminio Commons, MD     Allergies:     Allergies  Allergen Reactions  . Aspirin Other (See Comments)    High Doses Mess up her stomach; "makes my bowels have blood in them". Takes 81 mg EC Aspirin   . Penicillins Other (See Comments)    SYNCOPE? , "makes me real weak when I take it; like I'll pass out"  Has patient had a PCN reaction causing immediate rash, facial/tongue/throat swelling, SOB or lightheadedness with hypotension: Yes Has patient had a PCN reaction causing severe rash involving mucus membranes or skin necrosis: no Has patient had a PCN reaction that required hospitalization no Has patient had a PCN reaction occurring within the last 10 years: no If all of the above  . Amlodipine Swelling  . Bactrim  [Sulfamethoxazole-Trimethoprim] Rash  . Contrast Media [Iodinated Diagnostic Agents] Itching  . Iron Itching and Other (See Comments)    "they gave me iron in dialysis; had to give me Benadryl cause I had to have the iron" (05/02/2012)  . Nitrofurantoin Hives  . Tylenol [Acetaminophen] Itching and Other (See Comments)    Makes her feet on fire per pt  . Gabapentin Other (See Comments)    Unknown reaction  . Dexilant [Dexlansoprazole] Other (See Comments)    Upset stomach  . Levaquin [Levofloxacin In D5w] Rash  . Morphine And Related Itching and Other (See Comments)    Itching in feet  . Plavix [Clopidogrel Bisulfate] Rash  . Protonix [Pantoprazole Sodium] Rash  .  Venofer [Ferric Oxide] Itching and Other (See Comments)    Patient reports using Benadryl prior to doses as [facility]     Physical Exam:   Vitals  Blood pressure (!) 173/62, pulse 70, temperature 98.3 F (36.8 C), temperature source Oral, resp. rate (!) 21, height 5\' 1"  (1.549 m), weight 54.9 kg, SpO2 96 %.  Elderly female lying in bed in no acute distress HEENT: Pupils reactive bilaterally, EOMI, no pallor, no icterus, moist mucosa, supple neck, no JVD Chest: Mild reproducible tenderness on pressure, clear to auscultation bilaterally, no added sound CVs: Normal S1-S2, no murmurs or gallop GI: Soft, nondistended, nontender, bowel sounds present Musculoskeletal: Warm, no edema, left upper extremity AV graft CNS: Alert oriented, nonfocal   Data Review:    CBC Recent Labs  Lab 07/13/18 0203  WBC 6.7  HGB 11.5*  HCT 36.6  PLT 155  MCV 110.9*  MCH 34.8*  MCHC 31.4  RDW 14.3  LYMPHSABS 0.8  MONOABS 0.5  EOSABS 0.2  BASOSABS 0.1   ------------------------------------------------------------------------------------------------------------------  Chemistries  Recent Labs  Lab 07/13/18 0203  NA 142  K 3.8  CL 101  CO2 29  GLUCOSE 81  BUN 55*  CREATININE 7.98*  CALCIUM 8.7*    ------------------------------------------------------------------------------------------------------------------ estimated creatinine clearance is 4.4 mL/min (A) (by C-G formula based on SCr of 7.98 mg/dL (H)). ------------------------------------------------------------------------------------------------------------------ No results for input(s): TSH, T4TOTAL, T3FREE, THYROIDAB in the last 72 hours.  Invalid input(s): FREET3  Coagulation profile No results for input(s): INR, PROTIME in the last 168 hours. ------------------------------------------------------------------------------------------------------------------- No results for input(s): DDIMER in the last 72 hours. -------------------------------------------------------------------------------------------------------------------  Cardiac Enzymes Recent Labs  Lab 07/13/18 0203  TROPONINI 0.05*   ------------------------------------------------------------------------------------------------------------------    Component Value Date/Time   BNP 372.0 (H) 07/16/2016 1517     ---------------------------------------------------------------------------------------------------------------  Urinalysis    Component Value Date/Time   COLORURINE YELLOW 12/16/2012 1919   APPEARANCEUR CLOUDY (A) 12/16/2012 1919   LABSPEC 1.009 12/16/2012 1919   PHURINE 7.5 12/16/2012 1919   GLUCOSEU NEGATIVE 12/16/2012 1919   HGBUR TRACE (A) 12/16/2012 1919   BILIRUBINUR NEGATIVE 12/16/2012 1919   KETONESUR NEGATIVE 12/16/2012 1919   PROTEINUR 100 (A) 12/16/2012 1919   UROBILINOGEN 0.2 12/16/2012 1919   NITRITE NEGATIVE 12/16/2012 1919   LEUKOCYTESUR SMALL (A) 12/16/2012 1919    ----------------------------------------------------------------------------------------------------------------   Imaging Results:    Dg Chest 2 View  Result Date: 07/13/2018 CLINICAL DATA:  Chest pain EXAM: CHEST - 2 VIEW COMPARISON:  03/23/2018 FINDINGS:  Pain mild cardiomegaly with prior CABG. Right-greater-than-left basilar atelectasis. No focal airspace consolidation or pulmonary edema. IMPRESSION: No active cardiopulmonary disease. Electronically Signed   By: Ulyses Jarred M.D.   On: 07/13/2018 02:09    My personal review of EKG: Normal sinus rhythm at 89, ST depression in inferior leads, V5 and V6, LVH   Assessment & Plan:    Principal Problem:   Chest pain at rest Although she does have mild reproducible pain on pressure, given her clinical symptoms, new EKG changes and cardiac history her heart score is easily 5. Placed on observation on telemetry.  Mildly elevated troponin in the ED (has it chronically).  Cycle serial enzymes and EKG. She had t an intermediate risk nuclear stress test on 02/15/2018 showing  scar in the mid to distal inferior and anteroseptal walls.  There was no evidence of ischemia. Patient had patient is currently chest pain-free. Continue aspirin,  beta-blocker, statin and PRN sublingual nitrate.  Resume home dose Imdur and hydralazine.  Cardiology consulted and will follow up with further recommendations.  Active Problems:   ESRD on hemodialysis ([place]) On M, W, F schedule and follows with Dr. Lowanda Foster.  Due for dialysis today.  Does not appear volume overloaded clinically.  Resume home medications (cinacalcet, Renvela).  Discussed with nephrology who will see patient.    Anemia of chronic kidney failure Gets erythropoietin with dialysis.  Also on iron supplement.    Hx of CABG   Chronic diastolic CHF (congestive heart failure) (HCC) Appears euvolemic.  Continue beta-blocker and statin.    Hypertensive urgency/history of malignant hypertension.   Resume home beta-blocker, Imdur and hydralazine.  Takes clonidine as needed and have ordered a one-time dose.  Ordered PRN IV hydralazine.  CAD with history of NSTEMI, PCI and CABG. Continue Brilinta, aspirin, beta-blocker and statin.   DVT Prophylaxis: Subcu  heparin  AM Labs Ordered, also please review Full Orders  Family Communication: Admission, patients condition and plan of care including tests being ordered have been discussed with the patient and her husband at bedside   Code Status full code  Likely DC to home pending further cardiology evaluation  Condition: Fair Consults called: Cardiology, nephrology (Dr. Carolin Sicks)  Admission status: Observation  Time spent in minutes : 55   Guinn Delarosa M.D on 07/13/2018 at 8:30 AM  Between 7am to 7pm - Pager - [phone]. After 7pm go to [email or web address] - password [facility]  [facility] - Office  [phone]

## 2018-07-13 NOTE — ED Notes (Signed)
Date and time results received: 07/13/18 0250 (use smartphrase ".now" to insert current time)  Test: troponin Critical Value: 0.05  Name of Provider Notified: dr [name]  Orders Received? Or Actions Taken?: no orders received

## 2018-07-13 NOTE — Progress Notes (Signed)
Pt leaving with [facility] at this time in stable condition. Pt removed rings (3) and gave to husband to keep safe during tx and procedure.

## 2018-07-13 NOTE — Progress Notes (Signed)
Pt refused clonidine because she was afraid it would drop her BP too low - PA made aware. Pre-meds given per PA - [facility] in route.

## 2018-07-13 NOTE — Consult Note (Addendum)
Cardiology Consult    Patient ID: Shawna [name]; [id number]; 1941-07-07   Admit date: 07/13/2018 Date of Consult: 07/13/2018  Primary Care Provider: [name], MD Primary Cardiologist: Kate Sable, MD    Patient Profile    Shawna [name] is a 78 y.o. female with past medical history of CAD (s/p CABG in 2013 with most recent DES to D1 in 10/2016 with cath showing patent LIMA-LAD, SVG-RI, SVG-Mrg and SVG-RCA, NST in 02/2018 showing scar with no significant ischemia), HTN, HLD, PAF (not on anticoagulation given history of GIB), NSVT (on Amiodarone) and ESRD who is being seen today for the evaluation of chest pain and EKG changes at the request of Dr. Clementeen Graham.   History of Present Illness    Shawna [name] was most recently admitted to [facility] in 02/2018 for evaluation of chest pain. Cyclic enzymes remained flat and peaked at 0.04 and echocardiogram showed a preserved EF of 55 to 60% with no regional wall motion abnormalities. She did undergo stress testing which showed a small region of decreased tracer activity in the mid/distal anterior/anterior septal wall consistent with scar but no significant ischemia.  She was last examined by Dr. Bronson Ing in 04/2018 and reported having an episode of chest discomfort the night prior which resolved with the use of sublingual nitroglycerin. She was continued on her current medication regimen and informed to follow-up in 6 months.  In talking with the patient today, she reports developing a "band-like sensation" along her entire chest occurring yesterday which was slightly worse with exertion. She utilized sublingual nitroglycerin intermittently with improvement in her symptoms. Upon going to bed last night, she developed a sharp pain which radiated into her back and down her left arm. Reports that symptoms resembled when she required stent placement in the past. She took 2 sublingual nitroglycerin with improvement in her symptoms but her pain was still  present, therefore she came to the ED for further evaluation. At the time of this encounter, she still reports having a discomfort along her left pectoral region. She denies any recent dyspnea on exertion, orthopnea, PND, lower extremity edema, or palpitations.  Initial labs show WBC 6.7, Hgb 11.5, platelets 155, Na+ 142, K+ 3.8, and creatinine 7.98. Initial troponin 0.05 with repeat value at 0.06. CXR shows no active cardiopulmonary disease. EKG shows normal sinus rhythm, heart rate 89, with LVH and ST depression along the inferior and lateral leads which is new when compared to prior tracings in 02/2018.   Past Medical History:  Diagnosis Date  . Acute on chronic respiratory failure with hypoxia ([place]) 10/10/2016  . Anxiety   . Arthritis   . AVM (arteriovenous malformation) of colon   . CAD (coronary artery disease)    a. s/p CABG in 2013 b. most recent DES to D1 in 10/2016 with cath showing patent LIMA-LAD, SVG-RI, SVG-Mrg and SVG-RCA  c. NST in 02/2018 showing scar with no significant ischemia  . Carotid artery disease ([place])    a. 64-33% LICA, 08/9516   . Chronic bronchitis ([place])   . Chronic diastolic CHF (congestive heart failure) ([place])    a. 02/2012 Echo EF 60-65%, nl wall motion, Gr 1 DD, mod MR  . Colon cancer ([place]) 1992  . Esophageal stricture   . ESRD on hemodialysis ([place])    ESRD due to HTN, started dialysis 2011 and gets HD at [facility] with Dr Hinda Lenis on MWF schedule.  Access is LUA AVF as of Sept 2014.   Marland Kitchen  GERD (gastroesophageal reflux disease)   . High cholesterol 12/2011  . History of blood transfusion 07/2011; 12/2011; 01/2012 X 2; 04/2012  . History of gout   . History of lower GI bleeding   . Hypertension   . Iron deficiency anemia   . Mitral regurgitation    a. Moderate by echo, 02/2012  . Myocardial infarction ([place])   . Ovarian cancer ([place]) 1992  . Pneumonia ~ 2009  . PUD (peptic ulcer disease)   . TIA (transient ischemic attack)     Past Surgical History:    Procedure Laterality Date  . ABDOMINAL HYSTERECTOMY  1992  . APPENDECTOMY  06/1990  . AV FISTULA PLACEMENT  07/2009   left upper arm  . AV FISTULA PLACEMENT Right 09/06/2016   Procedure: RIGHT FOREARM ARTERIOVENOUS (AV) GRAFT;  Surgeon: Elam Dutch, MD;  Location: [facility] OR;  Service: Vascular;  Laterality: Right;  . AV FISTULA PLACEMENT N/A 02/24/2017   Procedure: INSERTION OF ARTERIOVENOUS (AV) GORE-TEX GRAFT ARM (BRACHIAL AXILLARY);  Surgeon: Katha Cabal, MD;  Location: [facility] ORS;  Service: Vascular;  Laterality: N/A;  . [place] Right 09/06/2016   Procedure: REMOVAL OF Right Arm ARTERIOVENOUS GORETEX GRAFT and Vein Patch angioplasty of brachial artery;  Surgeon: Angelia Mould, MD;  Location: [place];  Service: Vascular;  Laterality: Right;  . COLON RESECTION  1992  . COLON SURGERY    . CORONARY ANGIOPLASTY WITH STENT PLACEMENT  12/15/11   "2"  . CORONARY ANGIOPLASTY WITH STENT PLACEMENT  y/2013   "1; makes total of 3" (05/02/2012)  . CORONARY ARTERY BYPASS GRAFT  06/13/2012   Procedure: CORONARY ARTERY BYPASS GRAFTING (CABG);  Surgeon: Grace Isaac, MD;  Location: [place];  Service: Open Heart Surgery;  Laterality: N/A;  cabg x four;  using left internal mammary artery, and left leg greater saphenous vein harvested endoscopically  . CORONARY STENT INTERVENTION N/A 10/13/2016   Procedure: Coronary Stent Intervention;  Surgeon: Troy Sine, MD;  Location: [place] CV LAB;  Service: Cardiovascular;  Laterality: N/A;  . DIALYSIS/PERMA CATHETER REMOVAL N/A 04/18/2017   Procedure: DIALYSIS/PERMA CATHETER REMOVAL;  Surgeon: Katha Cabal, MD;  Location: [place] CV LAB;  Service: Cardiovascular;  Laterality: N/A;  . DILATION AND CURETTAGE OF UTERUS    . ESOPHAGOGASTRODUODENOSCOPY  01/20/2012   Procedure: ESOPHAGOGASTRODUODENOSCOPY (EGD);  Surgeon: Ladene Artist, MD,FACG;  Location: [facility] ENDOSCOPY;  Service: Endoscopy;  Laterality: N/A;  . ESOPHAGOGASTRODUODENOSCOPY  N/A 03/26/2013   Procedure: ESOPHAGOGASTRODUODENOSCOPY (EGD);  Surgeon: Irene Shipper, MD;  Location: [facility] ENDOSCOPY;  Service: Endoscopy;  Laterality: N/A;  . ESOPHAGOGASTRODUODENOSCOPY N/A 04/30/2015   Procedure: ESOPHAGOGASTRODUODENOSCOPY (EGD);  Surgeon: Rogene [place], MD;  [facility]O [facility];  Service: Endoscopy;  Laterality: N/A;  1pm - moved to 10/20 @ 1:10  . ESOPHAGOGASTRODUODENOSCOPY N/A 07/29/2016   Procedure: ESOPHAGOGASTRODUODENOSCOPY (EGD);  Surgeon: Manus Gunning, MD;  Location: [place];  Service: Gastroenterology;  Laterality: N/A;  enteroscopy  . INTRAOPERATIVE TRANSESOPHAGEAL ECHOCARDIOGRAM  06/13/2012   Procedure: INTRAOPERATIVE TRANSESOPHAGEAL ECHOCARDIOGRAM;  Surgeon: Grace Isaac, MD;  Location: [place];  Service: Open Heart Surgery;  Laterality: N/A;  . IR GENERIC HISTORICAL  07/26/2016   IR FLUORO GUIDE CV LINE RIGHT 07/26/2016 Greggory Keen, MD [facility]-INTERV RAD  . IR GENERIC HISTORICAL  07/26/2016   IR US GUIDE VASC ACCESS RIGHT 07/26/2016 Greggory Keen, MD [facility]  . IR GENERIC HISTORICAL  08/02/2016   IR US GUIDE VASC ACCESS RIGHT 08/02/2016 Greggory Keen,  MD [facility]  . IR GENERIC HISTORICAL  08/02/2016   IR FLUORO GUIDE CV LINE RIGHT 08/02/2016 Greggory Keen, MD [facility]  . IR RADIOLOGY PERIPHERAL GUIDED IV START  03/28/2017  . IR US GUIDE VASC ACCESS RIGHT  03/28/2017  . LEFT HEART CATH AND CORONARY ANGIOGRAPHY N/A 09/20/2016   Procedure: Left Heart Cath and Coronary Angiography;  Surgeon: [name], MD;  Location: [place] CV LAB;  Service: Cardiovascular;  Laterality: N/A;  . LEFT HEART CATH AND CORS/GRAFTS ANGIOGRAPHY N/A 10/13/2016   Procedure: Left Heart Cath and Cors/Grafts Angiography;  Surgeon: Troy Sine, MD;  Location: [place] CV LAB;  Service: Cardiovascular;  Laterality: N/A;  . LEFT HEART CATHETERIZATION WITH CORONARY ANGIOGRAM N/A 12/15/2011   Procedure: LEFT HEART CATHETERIZATION WITH CORONARY ANGIOGRAM;  Surgeon:  Burnell Blanks, MD;  Location: [facility] CATH LAB;  Service: Cardiovascular;  Laterality: N/A;  . LEFT HEART CATHETERIZATION WITH CORONARY ANGIOGRAM N/A 01/10/2012   Procedure: LEFT HEART CATHETERIZATION WITH CORONARY ANGIOGRAM;  Surgeon: Peter M Martinique, MD;  Location: [facility] CATH LAB;  Service: Cardiovascular;  Laterality: N/A;  . LEFT HEART CATHETERIZATION WITH CORONARY ANGIOGRAM N/A 06/08/2012   Procedure: LEFT HEART CATHETERIZATION WITH CORONARY ANGIOGRAM;  Surgeon: Burnell Blanks, MD;  Location: [facility] CATH LAB;  Service: Cardiovascular;  Laterality: N/A;  . LEFT HEART CATHETERIZATION WITH CORONARY/GRAFT ANGIOGRAM N/A 12/10/2013   Procedure: LEFT HEART CATHETERIZATION WITH Beatrix Fetters;  Surgeon: [name], MD;  Location: [facility] CATH LAB;  Service: Cardiovascular;  Laterality: N/A;  . OVARY SURGERY     ovarian cancer  . REVISION OF ARTERIOVENOUS GORETEX GRAFT N/A 02/24/2017   Procedure: REVISION OF ARTERIOVENOUS GORETEX GRAFT (RESECTION);  Surgeon: Katha Cabal, MD;  Location: [facility] ORS;  Service: Vascular;  Laterality: N/A;  [place] N/A 10/15/2013   Procedure: Fistulogram;  Surgeon: Serafina Mitchell, MD;  Location: [facility] CATH LAB;  Service: Cardiovascular;  Laterality: N/A;  . THROMBECTOMY / ARTERIOVENOUS GRAFT REVISION  2011   left upper arm  . TUBAL LIGATION  1980's  . UPPER EXTREMITY ANGIOGRAPHY Bilateral 12/06/2016   Procedure: Upper Extremity Angiography;  Surgeon: Katha Cabal, MD;  Location: [place] CV LAB;  Service: Cardiovascular;  Laterality: Bilateral;  . UPPER EXTREMITY INTERVENTION Left 06/06/2017   Procedure: UPPER EXTREMITY INTERVENTION;  Surgeon: Katha Cabal, MD;  Location: [place] CV LAB;  Service: Cardiovascular;  Laterality: Left;     Home Medications:  Prior to Admission medications   Medication Sig Start Date End Date Taking? Authorizing Provider  albuterol (PROVENTIL) (2.5 MG/3ML) 0.083% nebulizer solution Inhale 3 mLs  into the lungs every 4 (four) hours as needed. 04/27/18  Yes [provider]  ALPRAZolam (XANAX) 0.25 MG tablet Take 1 tablet (0.25 mg total) by mouth 2 (two) times daily as needed for anxiety or sleep. 04/05/17  Yes Arrien, Jimmy Picket, MD  BRILINTA 60 MG TABS tablet TAKE 1 TABLET BY MOUTH TWICE DAILY 04/10/18  Yes Herminio Commons, MD  cinacalcet (SENSIPAR) 30 MG tablet Take 30 mg by mouth daily after supper.   Yes [provider]  cloNIDine (CATAPRES) 0.2 MG tablet Take 0.1-0.2 mg by mouth daily as needed (high blood pressure).    Yes [provider]  diphenoxylate-atropine (LOMOTIL) 2.5-0.025 MG tablet Take 1 tablet by mouth 4 (four) times daily as needed for diarrhea or loose stools. 08/13/17  Yes Tanna Furry, MD  ferrous sulfate 325 (65 FE) MG EC tablet Take 1 tablet (325  mg total) by mouth 2 (two) times daily with a meal. Patient taking differently: Take 325 mg by mouth daily with breakfast.  07/15/17 07/13/18 Yes Johnson, Clanford L, MD  fluticasone (FLONASE) 50 MCG/ACT nasal spray Place 1 spray at bedtime as needed into both nostrils for allergies.    Yes [provider]  hydrALAZINE (APRESOLINE) 50 MG tablet Take 75 mg by mouth 2 (two) times daily as needed.    Yes [provider]  isosorbide mononitrate (IMDUR) 120 MG 24 hr tablet TAKE 1 TABLET BY MOUTH ONCE DAILY 07/05/18  Yes Herminio Commons, MD  multivitamin (RENA-VIT) TABS tablet Take 1 tablet by mouth daily.   Yes [provider]  nystatin-triamcinolone (MYCOLOG II) cream Apply to the affected area twice a day 06/29/18  Yes Rehman, Mechele Dawley, MD  omeprazole (PRILOSEC) 20 MG capsule Take 1 capsule (20 mg total) by mouth daily. 11/21/17  Yes Rehman, Mechele Dawley, MD  ondansetron (ZOFRAN-ODT) 4 MG disintegrating tablet Take 4 mg by mouth every 8 (eight) hours as needed for nausea or vomiting.  07/12/17  Yes [provider]  sevelamer carbonate (RENVELA) 800 MG tablet Take  800-2,400 mg by mouth See admin instructions. Take 3 tablets (2400) by mouth twice daily with meals, take 1 tablet (800 mg) with snacks   Yes [provider]  simvastatin (ZOCOR) 20 MG tablet TAKE ONE TABLET (20 MG) BY MOUTH DAILY AT BEDTIME 04/03/18  Yes Herminio Commons, MD  ticagrelor (BRILINTA) 60 MG TABS tablet Take 1 tablet (60 mg total) by mouth 2 (two) times daily. 03/19/18  Yes Herminio Commons, MD  bisacodyl (DULCOLAX) 10 MG suppository Place 1 suppository (10 mg total) rectally as needed for moderate constipation. 11/21/17   Rehman, Mechele Dawley, MD  carvedilol (COREG) 6.25 MG tablet TAKE 1 TABLET BY MOUTH TWICE DAILY WITH  A  MEAL Patient taking differently: TAKE 1 TABLET (6.25 MG) BY MOUTH TWICE DAILY WITH  A  MEAL 06/15/17   Herminio Commons, MD  diphenhydrAMINE (BENADRYL) 25 mg capsule Take 50 mg by mouth daily as needed for itching or allergies.     [provider]  epoetin alfa (EPOGEN,PROCRIT) 58527 UNIT/ML injection Inject 1 mL (10,000 Units total) into the vein every Monday, Wednesday, and Friday with hemodialysis. 04/11/16   Orvan Falconer, MD  lidocaine-prilocaine (EMLA) cream Apply 1 application topically every Monday, Wednesday, and Friday. Prior to dialysis    [provider]  nitroGLYCERIN (NITROSTAT) 0.4 MG SL tablet Place 1 tablet (0.4 mg total) under the tongue every 5 (five) minutes as needed for chest pain. 06/05/17   Herminio Commons, MD    Inpatient Medications: Scheduled Meds: . carvedilol  6.25 mg Oral BID WC  . cinacalcet  30 mg Oral QPC supper  . cloNIDine  0.1 mg Oral Daily  . epoetin alfa  10,000 Units Intravenous Q M,W,F-HD  . escitalopram  10 mg Oral Daily  . heparin  5,000 Units Subcutaneous Q8H  . hydrALAZINE  75 mg Oral BID  . isosorbide mononitrate  120 mg Oral Daily  . lidocaine-prilocaine  1 application Topical Q M,W,F  . multivitamin  1 tablet Oral Daily  . pantoprazole  40 mg Oral Daily  . sevelamer carbonate  2,400  mg Oral BID WC  . sevelamer carbonate  800 mg Oral With snacks  . simvastatin  20 mg Oral q1800  . ticagrelor  60 mg Oral BID   Continuous Infusions:  PRN Meds: ALPRAZolam, bisacodyl,  diphenhydrAMINE, diphenoxylate-atropine, fluticasone, hydrALAZINE, loratadine, nitroGLYCERIN, ondansetron (ZOFRAN) IV  Allergies:    Allergies  Allergen Reactions  . Aspirin Other (See Comments)    High Doses Mess up her stomach; "makes my bowels have blood in them". Takes 81 mg EC Aspirin   . Penicillins Other (See Comments)    SYNCOPE? , "makes me real weak when I take it; like I'll pass out"  Has patient had a PCN reaction causing immediate rash, facial/tongue/throat swelling, SOB or lightheadedness with hypotension: Yes Has patient had a PCN reaction causing severe rash involving mucus membranes or skin necrosis: no Has patient had a PCN reaction that required hospitalization no Has patient had a PCN reaction occurring within the last 10 years: no If all of the above  . Amlodipine Swelling  . Bactrim [Sulfamethoxazole-Trimethoprim] Rash  . Contrast Media [Iodinated Diagnostic Agents] Itching  . Iron Itching and Other (See Comments)    "they gave me iron in dialysis; had to give me Benadryl cause I had to have the iron" (05/02/2012)  . Nitrofurantoin Hives  . Tylenol [Acetaminophen] Itching and Other (See Comments)    Makes her feet on fire per pt  . Gabapentin Other (See Comments)    Unknown reaction  . Dexilant [Dexlansoprazole] Other (See Comments)    Upset stomach  . Levaquin [Levofloxacin In D5w] Rash  . Morphine And Related Itching and Other (See Comments)    Itching in feet  . Plavix [Clopidogrel Bisulfate] Rash  . Protonix [Pantoprazole Sodium] Rash  . Venofer [Ferric Oxide] Itching and Other (See Comments)    Patient reports using Benadryl prior to doses as [place] History:   Social History   Socioeconomic History  . Marital status: Married    Spouse name:  Not on file  . Number of children: Not on file  . Years of education: Not on file  . Highest education level: Not on file  Occupational History  . Not on file  Social Needs  . Financial resource strain: Not on file  . Food insecurity:    Worry: Not on file    Inability: Not on file  . Transportation needs:    Medical: Not on file    Non-medical: Not on file  Tobacco Use  . Smoking status: Never Smoker  . Smokeless tobacco: Never Used  Substance and Sexual Activity  . Alcohol use: No    Alcohol/week: 0.0 standard drinks  . Drug use: No  . Sexual activity: Yes    Birth control/protection: Surgical  Lifestyle  . Physical activity:    Days per week: Not on file    Minutes per session: Not on file  . Stress: Not on file  Relationships  . Social connections:    Talks on phone: Not on file    Gets together: Not on file    Attends religious service: Not on file    Active member of club or organization: Not on file    Attends meetings of clubs or organizations: Not on file    Relationship status: Not on file  . Intimate partner violence:    Fear of current or ex partner: Not on file    Emotionally abused: Not on file    Physically abused: Not on file    Forced sexual activity: Not on file  Other Topics Concern  . Not on file  Social History Narrative   Lives in [place], [place] with husband.  Dialysis pt - mwf.  Family History:    Family History  Problem Relation Age of Onset  . Heart disease Mother        Heart Disease before age 47  . Hyperlipidemia Mother   . Hypertension Mother   . Diabetes Mother   . Heart attack Mother   . Heart disease Father        Heart Disease before age 56  . Hyperlipidemia Father   . Hypertension Father   . Diabetes Father   . Diabetes Sister   . Hypertension Sister   . Diabetes Brother   . Hyperlipidemia Brother   . Heart attack Brother   . Hypertension Sister   . Heart attack Brother   . Other Other        noncontributory for  early CAD  . Colon cancer Neg Hx   . Esophageal cancer Neg Hx   . Liver disease Neg Hx   . Kidney disease Neg Hx   . Colon polyps Neg Hx       Review of Systems    General:  No chills, fever, night sweats or weight changes.  Cardiovascular:  No dyspnea on exertion, edema, orthopnea, palpitations, paroxysmal nocturnal dyspnea. Positive for chest pain.  Dermatological: No rash, lesions/masses Respiratory: No cough, dyspnea Urologic: No hematuria, dysuria Abdominal:   No nausea, vomiting, diarrhea, bright red blood per rectum, melena, or hematemesis Neurologic:  No visual changes, wkns, changes in mental status. All other systems reviewed and are otherwise negative except as noted above.  Physical Exam/Data    Vitals:   07/13/18 0900 07/13/18 0930 07/13/18 1045 07/13/18 1121  BP: (!) 181/65 (!) 172/66 (!) 175/67 (!) 177/62  Pulse:   70 66  Resp: 15 (!) 21 16 18   Temp:    97.6 F (36.4 C)  TempSrc:    Oral  SpO2:   100% 100%  Weight:      Height:       No intake or output data in the 24 hours ending 07/13/18 1127 Filed Weights   07/13/18 0055  Weight: 54.9 kg   Body mass index is 22.86 kg/m.   General: Pleasant, elderly African American female appearing in NAD Psych: Normal affect. Neuro: Alert and oriented X 3. Moves all extremities spontaneously. HEENT: Normal  Neck: Supple without bruits or JVD. Lungs:  Resp regular and unlabored, CTA without wheezing or rales. Heart: RRR with occasional ectopic beats. No s3, s4, or murmurs. Abdomen: Soft, non-tender, non-distended, BS + x 4.  Extremities: No clubbing, cyanosis or edema. DP/PT/Radials 2+ and equal bilaterally.   Labs/Studies     Relevant CV Studies:  Echocardiogram: 02/2018 Study Conclusions  - Left ventricle: The cavity size was normal. Wall thickness was   increased in a pattern of mild LVH. Systolic function was normal.   The estimated ejection fraction was in the range of 55% to 60%.   Wall motion  was normal; there were no regional wall motion   abnormalities. Features are consistent with a pseudonormal left   ventricular filling pattern, with concomitant abnormal relaxation   and increased filling pressure (grade 2 diastolic dysfunction).   Doppler parameters are consistent with high ventricular filling   pressure. - Aortic valve: Mildly to moderately calcified annulus. Trileaflet;   moderately thickened leaflets. Valve area (VTI): 1.88 cm^2. Valve   area (Vmax): 1.68 cm^2. - Mitral valve: Mildly to moderately calcified annulus. Mildly   thickened leaflets . There was mild regurgitation. - Left atrium: The atrium was severely dilated. -  Right ventricle: The cavity size was mildly dilated. Systolic   function was mildly reduced. - Right atrium: The atrium was severely dilated. - Atrial septum: No defect or patent foramen ovale was identified. - Tricuspid valve: There was moderate regurgitation. - Pulmonary arteries: Systolic pressure was moderately increased.   PA peak pressure: 56 mm Hg (S). - Technically adequate study.  NST: 02/2018  EKG changes are nondiagnostic due to baseline changes  Small region of dcreased tracer activity in the mid/distal anferior/anteorseptal wall consistent with scar. No significant ischemia  This is an intermediate risk study.  Cardiac Catheterization: 10/2016  Ramus lesion, 90 %stenosed.  Dist LAD lesion, 80 %stenosed.  LIMA.  SVG.  Ost RCA to Prox RCA lesion, 100 %stenosed.  SVG.  Ost Cx to Prox Cx lesion, 90 %stenosed.  Mid LAD lesion, 75 %stenosed.  A STENT RESOLUTE ONYX 6.28Z66 drug eluting stent was successfully placed, and overlaps previously placed stent.  Ost 1st Diag to 1st Diag lesion, 99 %stenosed.  Post intervention, there is a 0% residual stenosis.  1st Diag lesion, 20 %stenosed.   Severe multivessel native CAD with 99.9% ostial proximal diagonal 1 stenosis in a vessel which had been previously stented and now  has TIMI 1 flow, 85% LAD stenosis immediately after the takeoff of this first diagonal vessel and 80% mid LAD stenosis just proximal to the insertion of the LIMA graft.  Ramus intermediate 90% ostial stenosis with competitive filling due a patent SVG.  Ostial Left circumflex 90% in-stent restenosis and occlusion of a marginal branch beyond the stented segment.  Total occlusion of a stent at the ostium of the RCA.  Patent LIMA graft supplying the mid distal LAD with insertion beyond the mid 80% stenosis.  Patent SVG supplying the ramus intermediate vessel with 30% narrowing just beyond the anastomosis.  Patent SVG supplying the distal RCA which fills the entire RCA distally and antegrade up to the occluded ostial stent.  Successful percutaneous coronary intervention to the subtotally occluded diagonal vessel with the 99.9%  stenosis being reduced to 0% with PTCA and insertion of a 2.2515 mm Resolute Onyx DES stent into the previously placed stent post dilated to 2.45 mm and with the stenosis being reduced to 0% in the entire stented segment.  The sluggish TIMI-1 flow at the start of the procedure was improved to brisk TIMI-3 flow in the diagonal vessel following PCI. The LAD stenosis immediately after the diagonal vessel was 80% stenosed at the completion of the procedure.  RECOMMENDATION: Brillinta was used for antiplatelet therapy during this procedure due to the patient's reported rash to Plavix.  She will need to continue dual antiplatelet therapy.  Medical therapy for concomitant CAD.  Laboratory Data:  Chemistry Recent Labs  Lab 07/13/18 0203  NA 142  K 3.8  CL 101  CO2 29  GLUCOSE 81  BUN 55*  CREATININE 7.98*  CALCIUM 8.7*  GFRNONAA 4*  GFRAA 5*  ANIONGAP 12    No results for input(s): PROT, ALBUMIN, AST, ALT, ALKPHOS, BILITOT in the last 168 hours. Hematology Recent Labs  Lab 07/13/18 0203  WBC 6.7  RBC 3.30*  HGB 11.5*  HCT 36.6  MCV 110.9*  MCH 34.8*    MCHC 31.4  RDW 14.3  PLT 155   Cardiac Enzymes Recent Labs  Lab 07/13/18 0203 07/13/18 0836  TROPONINI 0.05* 0.06*   No results for input(s): TROPIPOC in the last 168 hours.  BNPNo results for input(s): BNP, PROBNP in the last 168  hours.  DDimer No results for input(s): DDIMER in the last 168 hours.  Radiology/Studies:  Dg Chest 2 View  Result Date: 07/13/2018 CLINICAL DATA:  Chest pain EXAM: CHEST - 2 VIEW COMPARISON:  03/23/2018 FINDINGS: Pain mild cardiomegaly with prior CABG. Right-greater-than-left basilar atelectasis. No focal airspace consolidation or pulmonary edema. IMPRESSION: No active cardiopulmonary disease. Electronically Signed   By: Ulyses Jarred M.D.   On: 07/13/2018 02:09     Assessment & Plan    1. Chest Pain in the setting of Known CAD - the patient is s/p CABG in 2013 with most recent DES to D1 in 10/2016 with cath showing patent LIMA-LAD, SVG-RI, SVG-Mrg and SVG-RCA as outlined above. Underwent recent NST in 02/2018 showing scar with no significant ischemia. - presents with worsening chest discomfort which started yesterday and was worse with exertion and relieved with SL NTG. Pain radiated into her left arm and resembled her prior angina. Had been having intermittent episodes of chest pain since her last admission but nothing as severe as her presenting symptoms.  - Initial troponin 0.05 with repeat value at 0.06. EKG shows LVH with ST depression along the inferior and lateral leads which is new when compared to prior tracings in 02/2018. Still having pain at this time per her report. Will obtain a repeat EKG.  - given her presenting symptoms and EKG changes, would anticipate a repeat cardiac catheterization this admission. Will review with Dr. Harrington Challenger. She did consume breakfast around 0900 so would need to be later today if able to be added to the schedule. Will need to coordinate timing of HD with Nephrology.  - continue ASA, Brilinta, BB, and statin therapy.   2.  HTN - BP has been elevated at 160/62 - 194/78 while in the ED. She just recently received her a.m. Coreg and is scheduled to receive Imdur and Hydralazine at this time.  3. HLD - FLP in 10/2017 showed LDL at 26.  She remains on Simvastatin 20mg  daily.   4. History of PAF/NSVT - She denies any recent palpitations and is maintaining normal sinus rhythm by review of telemetry.  She is not on anticoagulation given history of a GI bleed. Has remained on Amiodarone 200 mg daily for episodes of NSVT. LFT's within normal limits when checked in 04/2018 with TSH slightly elevated at 5.5. Continue to follow thyroid function given the use of amiodarone.  5. ESRD - on HD - Nephrology consulted by the admitting team. Undergoes HD on MWF schedule. If requiring a catheterization today, would likely need to have HD tomorrow morning.    For questions or updates, please contact [place] Please consult [email or web address] for contact info under Cardiology/STEMI.  Signed, Erma Heritage, PA-C 07/13/2018, 11:27 AM Pager: [phone]   Patient seen and examined   I agree with findings as noted by B Strader above Pt is a 78 yo with known CAD (s/p CABG in 2013; DES stent to D1 in April 2018.   Pt with ESRD , undergoes hemodialysis    Yesterday she was at home when she developed band like tightness across chest   Not worse with breathing    Dialysis has been unchanged  Last night she developed pain in L back that radieated to chest and down arme   Arm portion she has had with angina in past    Pt had symtpoms earlier today and they have eased some   On exam: JVP is increased  Lungs are CTA  Cardiac   RRR S1, S2  No S3 Abd is benign Ext are without edema  EKG shows SR and LVH with strain pattern   ST segment changes have been intermitt in past   Symptoms of tightness are not like her reflux    She has some probable mild volume increase but not bad.   No evid for lung infection or  inflammation Cncerning therefore for angina.   Given ongoing need for dialysis I would recomm L heart cath to define anatomy Pt will have dialysis after  Pt understands and agrees to proceed   Will tx to [facility] today .  Dorris Carnes

## 2018-07-13 NOTE — Interval H&P Note (Signed)
History and Physical Interval Note:  07/13/2018 1:40 PM  Shawna [name]  has presented today for surgery, with the diagnosis of cp  The various methods of treatment have been discussed with the patient and family. After consideration of risks, benefits and other options for treatment, the patient has consented to  Procedure(s): LEFT HEART CATH AND CORS/GRAFTS ANGIOGRAPHY (N/A) as a surgical intervention .  The patient's history has been reviewed, patient examined, no change in status, stable for surgery.  I have reviewed the patient's chart and labs.  Questions were answered to the patient's satisfaction.   Cath Lab Visit (complete for each Cath Lab visit)  Clinical Evaluation Leading to the Procedure:   ACS: Yes.    Non-ACS:    Anginal Classification: CCS IV  Anti-ischemic medical therapy: Maximal Therapy (2 or more classes of medications)  Non-Invasive Test Results: No non-invasive testing performed  Prior CABG: Previous CABG        Shawna [name] 07/13/2018 1:40 PM

## 2018-07-13 NOTE — ED Notes (Signed)
Date and time results received: 07/13/18 9:23 AM    Test: trop Critical Value: 0.06  Name of Provider Notified:Dr  Dhungel  Orders Received? Or Actions Taken?:

## 2018-07-13 NOTE — ED Notes (Signed)
attempted to call report to dept 300, nurse unavailable at this time

## 2018-07-14 DIAGNOSIS — R079 Chest pain, unspecified: Secondary | ICD-10-CM | POA: Diagnosis not present

## 2018-07-14 DIAGNOSIS — M6281 Muscle weakness (generalized): Secondary | ICD-10-CM | POA: Diagnosis not present

## 2018-07-14 DIAGNOSIS — I120 Hypertensive chronic kidney disease with stage 5 chronic kidney disease or end stage renal disease: Secondary | ICD-10-CM | POA: Diagnosis not present

## 2018-07-14 DIAGNOSIS — Z992 Dependence on renal dialysis: Secondary | ICD-10-CM | POA: Diagnosis not present

## 2018-07-14 DIAGNOSIS — N186 End stage renal disease: Secondary | ICD-10-CM | POA: Diagnosis not present

## 2018-07-14 DIAGNOSIS — D631 Anemia in chronic kidney disease: Secondary | ICD-10-CM | POA: Diagnosis not present

## 2018-07-14 DIAGNOSIS — I25110 Atherosclerotic heart disease of native coronary artery with unstable angina pectoris: Secondary | ICD-10-CM | POA: Diagnosis not present

## 2018-07-14 DIAGNOSIS — I5032 Chronic diastolic (congestive) heart failure: Secondary | ICD-10-CM | POA: Diagnosis not present

## 2018-07-14 DIAGNOSIS — I160 Hypertensive urgency: Secondary | ICD-10-CM | POA: Diagnosis not present

## 2018-07-14 DIAGNOSIS — Z955 Presence of coronary angioplasty implant and graft: Secondary | ICD-10-CM | POA: Diagnosis not present

## 2018-07-14 DIAGNOSIS — N2581 Secondary hyperparathyroidism of renal origin: Secondary | ICD-10-CM | POA: Diagnosis not present

## 2018-07-14 DIAGNOSIS — I132 Hypertensive heart and chronic kidney disease with heart failure and with stage 5 chronic kidney disease, or end stage renal disease: Secondary | ICD-10-CM | POA: Diagnosis not present

## 2018-07-14 DIAGNOSIS — I2584 Coronary atherosclerosis due to calcified coronary lesion: Secondary | ICD-10-CM | POA: Diagnosis not present

## 2018-07-14 LAB — CBC
HCT: 33.4 % — ABNORMAL LOW (ref 36.0–46.0)
Hemoglobin: 10.6 g/dL — ABNORMAL LOW (ref 12.0–15.0)
MCH: 33.8 pg (ref 26.0–34.0)
MCHC: 31.7 g/dL (ref 30.0–36.0)
MCV: 106.4 fL — ABNORMAL HIGH (ref 80.0–100.0)
NRBC: 0 % (ref 0.0–0.2)
Platelets: 159 10*3/uL (ref 150–400)
RBC: 3.14 MIL/uL — ABNORMAL LOW (ref 3.87–5.11)
RDW: 14.4 % (ref 11.5–15.5)
WBC: 9.7 10*3/uL (ref 4.0–10.5)

## 2018-07-14 LAB — RENAL FUNCTION PANEL
ANION GAP: 13 (ref 5–15)
Albumin: 2.9 g/dL — ABNORMAL LOW (ref 3.5–5.0)
BUN: 73 mg/dL — ABNORMAL HIGH (ref 8–23)
CO2: 21 mmol/L — ABNORMAL LOW (ref 22–32)
Calcium: 8.1 mg/dL — ABNORMAL LOW (ref 8.9–10.3)
Chloride: 101 mmol/L (ref 98–111)
Creatinine, Ser: 9.94 mg/dL — ABNORMAL HIGH (ref 0.44–1.00)
GFR calc Af Amer: 4 mL/min — ABNORMAL LOW (ref >60)
GFR calc non Af Amer: 3 mL/min — ABNORMAL LOW (ref >60)
Glucose, Bld: 133 mg/dL — ABNORMAL HIGH (ref 70–99)
Phosphorus: 2.7 mg/dL (ref 2.5–4.6)
Potassium: 4.9 mmol/L (ref 3.5–5.1)
Sodium: 135 mmol/L (ref 135–145)

## 2018-07-14 LAB — MRSA PCR SCREENING: MRSA by PCR: NEGATIVE

## 2018-07-14 MED ORDER — AMIODARONE HCL 200 MG PO TABS
200.0000 mg | ORAL_TABLET | Freq: Every day | ORAL | Status: DC
  Administered 2018-07-14: 200 mg via ORAL
  Filled 2018-07-14: qty 1

## 2018-07-14 MED ORDER — IBUPROFEN 200 MG PO TABS
400.0000 mg | ORAL_TABLET | Freq: Once | ORAL | Status: AC
  Administered 2018-07-14: 200 mg via ORAL
  Filled 2018-07-14: qty 2

## 2018-07-14 MED ORDER — AMIODARONE HCL 200 MG PO TABS: 200.0000 mg | ORAL_TABLET | Freq: Every day | ORAL | 0 refills | Status: DC

## 2018-07-14 MED ORDER — PENTAFLUOROPROP-TETRAFLUOROETH EX AERO: 1.0000 "application " | INHALATION_SPRAY | CUTANEOUS | Status: DC | PRN

## 2018-07-14 MED ORDER — CARVEDILOL 12.5 MG PO TABS
12.5000 mg | ORAL_TABLET | Freq: Two times a day (BID) | ORAL | Status: DC
  Filled 2018-07-14: qty 1

## 2018-07-14 MED ORDER — CARVEDILOL 12.5 MG PO TABS: 12.5000 mg | ORAL_TABLET | Freq: Two times a day (BID) | ORAL | 0 refills | Status: DC

## 2018-07-14 MED ORDER — SODIUM CHLORIDE 0.9 % IV SOLN: 100.0000 mL | INTRAVENOUS | Status: DC | PRN

## 2018-07-14 MED ORDER — ESCITALOPRAM OXALATE 10 MG PO TABS: 10.0000 mg | ORAL_TABLET | Freq: Every day | ORAL | 0 refills | Status: DC

## 2018-07-14 MED ORDER — LORATADINE 10 MG PO TABS: 10.0000 mg | ORAL_TABLET | Freq: Every day | ORAL | 0 refills | Status: DC | PRN

## 2018-07-14 MED ORDER — LIDOCAINE-PRILOCAINE 2.5-2.5 % EX CREA: 1.0000 "application " | TOPICAL_CREAM | CUTANEOUS | Status: DC | PRN

## 2018-07-14 MED ORDER — HEPARIN SODIUM (PORCINE) 1000 UNIT/ML DIALYSIS: 1000.0000 [IU] | INTRAMUSCULAR | Status: DC | PRN

## 2018-07-14 MED ORDER — LIDOCAINE HCL (PF) 1 % IJ SOLN: 5.0000 mL | INTRAMUSCULAR | Status: DC | PRN

## 2018-07-14 MED ORDER — ALTEPLASE 2 MG IJ SOLR: 2.0000 mg | Freq: Once | INTRAMUSCULAR | Status: DC | PRN

## 2018-07-14 MED ORDER — OXYCODONE HCL 5 MG PO TABS
5.0000 mg | ORAL_TABLET | Freq: Once | ORAL | Status: DC
  Filled 2018-07-14: qty 1

## 2018-07-14 MED ORDER — ASPIRIN EC 81 MG PO TBEC: 81.0000 mg | DELAYED_RELEASE_TABLET | Freq: Every day | ORAL | 1 refills | Status: AC

## 2018-07-14 NOTE — Discharge Summary (Signed)
Physician Discharge Summary  Shawna [name] KDX:[id number] DOB: 20-Jan-1941 DOA: 07/13/2018  PCP: [name], MD  Admit date: 07/13/2018 Discharge date: 07/14/2018  Admitted From: Home.  Disposition:  Home.   Recommendations for Outpatient Follow-up:  1. Follow up with PCP in 1-2 weeks 2. Please obtain BMP/CBC in one week Please follow up with cardiology and nephrology as recommended.   Discharge Condition:stable.  CODE STATUS: full code.  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 EKG showed normal sinus rhythm at 89 with LVH and new ST depression in inferior leads, V5 and V6. Chest x-ray negative for any infiltrate or pulmonary congestion. Cardiology and nephrology consulted and recommendations given.   Discharge Diagnoses:  Principal Problem:   Chest pain at rest Active Problems:   ESRD on hemodialysis ([place])   Anemia of chronic kidney failure   Hx of CABG   Chronic diastolic CHF (congestive heart failure) ([place])   Hypertensive urgency   H/O non-ST elevation myocardial infarction (NSTEMI)  Atypical chest pain:  Mild elevation in troponin s, EKG abnormal.  Cardiology consulted and she underwent cardiac cath , recommended medical therapy with aspirin, Brillinta.    ESRD on HD.  Nephrology consulted and underwent HD this am.     Anemia of chronic disease:  Hemoglobin stable .    H/o chronic diastolic heart failure: She appears euvolemic.    Hypertension;  Better controlled.    Discharge  Instructions  Discharge Instructions    Diet - low sodium heart healthy   Complete by:  As directed    Discharge instructions   Complete by:  As directed    Follow up with cardiology as recommended.  Please follow up with nephrology as recommended.     Allergies as of 07/14/2018      Reactions   Aspirin Other (See Comments)   High Doses Mess up her stomach; "makes my bowels have blood in them". Takes 81 mg EC Aspirin    Penicillins Other (See Comments)   SYNCOPE? , "makes me real weak when I take it; like I'll pass out" Has patient had a PCN reaction causing immediate rash, facial/tongue/throat swelling, SOB or lightheadedness with hypotension: Yes Has patient had a PCN reaction causing severe rash involving mucus membranes or skin necrosis: no Has patient had a PCN reaction that required hospitalization no Has patient had a PCN reaction occurring within the last 10 years: no If all of the above   Amlodipine Swelling   Bactrim [sulfamethoxazole-trimethoprim] Rash   Contrast Media [iodinated Diagnostic Agents] Itching   Iron Itching, Other (See Comments)   "they gave me iron in dialysis; had to give me Benadryl cause I had to have the iron" (05/02/2012)   Nitrofurantoin Hives   Tylenol [acetaminophen] Itching, Other (See Comments)   Makes her feet on fire per pt   Gabapentin Other (See Comments)   Unknown reaction   Dexilant [dexlansoprazole] Other (See Comments)   Upset stomach   Levaquin [levofloxacin In D5w] Rash   Morphine And Related Itching, Other (See Comments)   Itching in feet   Plavix [clopidogrel Bisulfate] Rash  Protonix [pantoprazole Sodium] Rash   Venofer [ferric Oxide] Itching, Other (See Comments)   Patient reports using Benadryl prior to doses as [place]      Medication List    TAKE these medications   albuterol (2.5 MG/3ML) 0.083% nebulizer solution Commonly known as:  PROVENTIL Inhale 3 mLs into the lungs every 4 (four) hours as needed.    ALPRAZolam 0.25 MG tablet Commonly known as:  XANAX Take 1 tablet (0.25 mg total) by mouth 2 (two) times daily as needed for anxiety or sleep.   amiodarone 200 MG tablet Commonly known as:  PACERONE Take 1 tablet (200 mg total) by mouth daily.   aspirin EC 81 MG tablet Take 1 tablet (81 mg total) by mouth daily.   bisacodyl 10 MG suppository Commonly known as:  DULCOLAX Place 1 suppository (10 mg total) rectally as needed for moderate constipation.   BRILINTA 60 MG Tabs tablet Generic drug:  ticagrelor TAKE 1 TABLET BY MOUTH TWICE DAILY What changed:  Another medication with the same name was removed. Continue taking this medication, and follow the directions you see here.   carvedilol 12.5 MG tablet Commonly known as:  COREG Take 1 tablet (12.5 mg total) by mouth 2 (two) times daily with a meal. What changed:    medication strength  See the new instructions.   cinacalcet 30 MG tablet Commonly known as:  SENSIPAR Take 30 mg by mouth daily after supper.   cloNIDine 0.2 MG tablet Commonly known as:  CATAPRES Take 0.1-0.2 mg by mouth daily as needed (high blood pressure).   diphenhydrAMINE 25 mg capsule Commonly known as:  BENADRYL Take 50 mg by mouth daily as needed for itching or allergies.   diphenoxylate-atropine 2.5-0.025 MG tablet Commonly known as:  LOMOTIL Take 1 tablet by mouth 4 (four) times daily as needed for diarrhea or loose stools.   epoetin alfa 10000 UNIT/ML injection Commonly known as:  EPOGEN,PROCRIT Inject 1 mL (10,000 Units total) into the vein every Monday, Wednesday, and Friday with hemodialysis.   escitalopram 10 MG tablet Commonly known as:  LEXAPRO Take 1 tablet (10 mg total) by mouth daily.   ferrous sulfate 325 (65 FE) MG EC tablet Take 1 tablet (325 mg total) by mouth 2 (two) times daily with a meal. What changed:  when to take this   fluticasone 50 MCG/ACT nasal spray Commonly known as:  FLONASE Place 1 spray at bedtime as needed  into both nostrils for allergies.   hydrALAZINE 50 MG tablet Commonly known as:  APRESOLINE Take 75 mg by mouth 2 (two) times daily as needed.   isosorbide mononitrate 120 MG 24 hr tablet Commonly known as:  IMDUR TAKE 1 TABLET BY MOUTH ONCE DAILY   lidocaine-prilocaine cream Commonly known as:  EMLA Apply 1 application topically every Monday, Wednesday, and Friday. Prior to dialysis   loratadine 10 MG tablet Commonly known as:  CLARITIN Take 1 tablet (10 mg total) by mouth daily as needed for allergies.   multivitamin Tabs tablet Take 1 tablet by mouth daily.   nitroGLYCERIN 0.4 MG SL tablet Commonly known as:  NITROSTAT Place 1 tablet (0.4 mg total) under the tongue every 5 (five) minutes as needed for chest pain.   nystatin-triamcinolone cream Commonly known as:  MYCOLOG II Apply to the affected area twice a day   omeprazole 20 MG capsule Commonly known as:  PRILOSEC Take 1 capsule (20 mg total) by mouth daily.   ondansetron 4 MG disintegrating  tablet Commonly known as:  ZOFRAN-ODT Take 4 mg by mouth every 8 (eight) hours as needed for nausea or vomiting.   sevelamer carbonate 800 MG tablet Commonly known as:  RENVELA Take 800-2,400 mg by mouth See admin instructions. Take 3 tablets (2400) by mouth twice daily with meals, take 1 tablet (800 mg) with snacks   simvastatin 20 MG tablet Commonly known as:  ZOCOR TAKE ONE TABLET (20 MG) BY MOUTH DAILY AT BEDTIME      Follow-up Information    Herminio Commons, MD Follow up.   Specialty:  Cardiology Why:  The office will call you to schedule an appointment for 2-4 weeks.  Contact information: [place] [address] [phone]        [name], MD. Schedule an appointment as soon as possible for a visit in 1 week(s).   Specialty:  Family Medicine Contact information: [address] [place] [address] [phone]          Allergies  Allergen Reactions  . Aspirin Other (See Comments)     High Doses Mess up her stomach; "makes my bowels have blood in them". Takes 81 mg EC Aspirin   . Penicillins Other (See Comments)    SYNCOPE? , "makes me real weak when I take it; like I'll pass out"  Has patient had a PCN reaction causing immediate rash, facial/tongue/throat swelling, SOB or lightheadedness with hypotension: Yes Has patient had a PCN reaction causing severe rash involving mucus membranes or skin necrosis: no Has patient had a PCN reaction that required hospitalization no Has patient had a PCN reaction occurring within the last 10 years: no If all of the above  . Amlodipine Swelling  . Bactrim [Sulfamethoxazole-Trimethoprim] Rash  . Contrast Media [Iodinated Diagnostic Agents] Itching  . Iron Itching and Other (See Comments)    "they gave me iron in dialysis; had to give me Benadryl cause I had to have the iron" (05/02/2012)  . Nitrofurantoin Hives  . Tylenol [Acetaminophen] Itching and Other (See Comments)    Makes her feet on fire per pt  . Gabapentin Other (See Comments)    Unknown reaction  . Dexilant [Dexlansoprazole] Other (See Comments)    Upset stomach  . Levaquin [Levofloxacin In D5w] Rash  . Morphine And Related Itching and Other (See Comments)    Itching in feet  . Plavix [Clopidogrel Bisulfate] Rash  . Protonix [Pantoprazole Sodium] Rash  . Venofer [Ferric Oxide] Itching and Other (See Comments)    Patient reports using Benadryl prior to doses as [facility]    Consultations:  Cardiology  Nephrology.    Procedures/Studies: Dg Chest 2 View  Result Date: 07/13/2018 CLINICAL DATA:  Chest pain EXAM: CHEST - 2 VIEW COMPARISON:  03/23/2018 FINDINGS: Pain mild cardiomegaly with prior CABG. Right-greater-than-left basilar atelectasis. No focal airspace consolidation or pulmonary edema. IMPRESSION: No active cardiopulmonary disease. Electronically Signed   By: Ulyses Jarred M.D.   On: 07/13/2018 02:09       Subjective: No new complaints.    Discharge Exam: Vitals:   07/14/18 1319 07/14/18 1451  BP: (!) 124/44 (!) 115/43  Pulse: 79 80  Resp:    Temp:    SpO2:     Vitals:   07/14/18 0948 07/14/18 1153 07/14/18 1319 07/14/18 1451  BP: (!) 124/44 (!) 104/36 (!) 124/44 (!) 115/43  Pulse: 79 71 79 80  Resp:  20    Temp:  99.2 F (37.3 C)  TempSrc:  Oral    SpO2:  96%    Weight:      Height:        General: Pt is alert, awake, not in acute distress Cardiovascular: RRR, S1/S2 +, no rubs, no gallops Respiratory: CTA bilaterally, no wheezing, no rhonchi Abdominal: Soft, NT, ND, bowel sounds + Extremities: no edema, no cyanosis    The results of significant diagnostics from this hospitalization (including imaging, microbiology, ancillary and laboratory) are listed below for reference.     Microbiology: Recent Results (from the past 240 hour(s))  MRSA PCR Screening     Status: None   Collection Time: 07/14/18  6:47 AM  Result Value Ref Range Status   MRSA by PCR NEGATIVE NEGATIVE Final    Comment:        The GeneXpert MRSA Assay (FDA approved for NASAL specimens only), is one component of a comprehensive MRSA colonization surveillance program. It is not intended to diagnose MRSA infection nor to guide or monitor treatment for MRSA infections. Performed at [facility], [place] [address], [place], [place] [address]      Labs: BNP (last 3 results) No results for input(s): BNP in the last 8760 hours. Basic Metabolic Panel: Recent Labs  Lab 07/13/18 0203 07/14/18 0200  NA 142 135  K 3.8 4.9  CL 101 101  CO2 29 21*  GLUCOSE 81 133*  BUN 55* 73*  CREATININE 7.98* 9.94*  CALCIUM 8.7* 8.1*  PHOS  --  2.7   Liver Function Tests: Recent Labs  Lab 07/14/18 0200  ALBUMIN 2.9*   No results for input(s): LIPASE, AMYLASE in the last 168 hours. No results for input(s): AMMONIA in the last 168 hours. CBC: Recent Labs  Lab 07/13/18 0203 07/14/18 0200  WBC 6.7 9.7  NEUTROABS 5.1  --   HGB  11.5* 10.6*  HCT 36.6 33.4*  MCV 110.9* 106.4*  PLT 155 159   Cardiac Enzymes: Recent Labs  Lab 07/13/18 0203 07/13/18 0836  TROPONINI 0.05* 0.06*   BNP: Invalid input(s): POCBNP CBG: No results for input(s): GLUCAP in the last 168 hours. D-Dimer No results for input(s): DDIMER in the last 72 hours. Hgb A1c No results for input(s): HGBA1C in the last 72 hours. Lipid Profile No results for input(s): CHOL, HDL, LDLCALC, TRIG, CHOLHDL, LDLDIRECT in the last 72 hours. Thyroid function studies No results for input(s): TSH, T4TOTAL, T3FREE, THYROIDAB in the last 72 hours.  Invalid input(s): FREET3 Anemia work up No results for input(s): VITAMINB12, FOLATE, FERRITIN, TIBC, IRON, RETICCTPCT in the last 72 hours. Urinalysis    Component Value Date/Time   COLORURINE YELLOW 12/16/2012 1919   APPEARANCEUR CLOUDY (A) 12/16/2012 1919   LABSPEC 1.009 12/16/2012 1919   PHURINE 7.5 12/16/2012 1919   GLUCOSEU NEGATIVE 12/16/2012 1919   HGBUR TRACE (A) 12/16/2012 1919   BILIRUBINUR NEGATIVE 12/16/2012 1919   KETONESUR NEGATIVE 12/16/2012 1919   PROTEINUR 100 (A) 12/16/2012 1919   UROBILINOGEN 0.2 12/16/2012 1919   NITRITE NEGATIVE 12/16/2012 1919   LEUKOCYTESUR SMALL (A) 12/16/2012 1919   Sepsis Labs Invalid input(s): PROCALCITONIN,  WBC,  LACTICIDVEN Microbiology Recent Results (from the past 240 hour(s))  MRSA PCR Screening     Status: None   Collection Time: 07/14/18  6:47 AM  Result Value Ref Range Status   MRSA by PCR NEGATIVE NEGATIVE Final    Comment:        The GeneXpert MRSA Assay (FDA approved for NASAL specimens only), is one component  of a comprehensive MRSA colonization surveillance program. It is not intended to diagnose MRSA infection nor to guide or monitor treatment for MRSA infections. Performed at [facility], [place] [address] [place], [place] [address]      Time coordinating discharge: 35 minutes  SIGNED:   Hosie Poisson, MD  [facility] 07/14/2018, 4:23 PM Pager   If 7PM-7AM, please contact night-coverage [email or web address] Password [email or web address]

## 2018-07-14 NOTE — Progress Notes (Signed)
Pt states she does not want medications at this time Will try again later and continue to monitor    Right groin site, clean, dry and intact, no swelling or bruising

## 2018-07-14 NOTE — Progress Notes (Signed)
HD RN reported to this RN that the pt was c/o headache during HD. MD paged. Orders received for Motrin 400mg  PO x1 and Oxy IR 5mg  PO x1. Pt only accepting Motrin 200mg  PO of the ordered dose. Pt refuses Oxy IR. Motrin 200mg  PO given to patient by this RN. Will continue to monitor.

## 2018-07-14 NOTE — Progress Notes (Signed)
HD tx initiated via 15Gx2 w/o problem Pull/push/flush well w/o problem VSS Will continue to monitor while on HD tx 

## 2018-07-14 NOTE — Progress Notes (Signed)
Pt discharge education provided by lamelva. Pt IV and telemetry removed. Pt has all belongings. Pt discharged via wheelchair with RN

## 2018-07-14 NOTE — Evaluation (Signed)
Physical Therapy Evaluation Patient Details Name: Shawna [name] MRN: [id number] DOB: Mar 05, 1941 Today's Date: 07/14/2018   History of Present Illness  78 yo female with onset of chest pain and elevated troponin was admitted, heart cath on 07/13/18.  No report located for test, but imaging shows atelectasis.  EF 55-60% in most recent test.  PMHx:  MI, HD, TIA, gout, CABG, CAD, PAF, stent, AVM colon, transfusion,   Clinical Impression  Pt was able to walk with RW today, noted her control of walker was fairly good with occas loss of central alignment of herself in walker with turns. Her plan is to work on endurance and control of walker along with stairs for DC home, and expect HHPT, who may already be in place, to continue with her.  Follow acutely for above plan.    Follow Up Recommendations Home health PT;Supervision - Intermittent    Equipment Recommendations  Rolling walker with 5" wheels    Recommendations for Other Services       Precautions / Restrictions Precautions Precautions: Fall(telemetry) Restrictions Weight Bearing Restrictions: No      Mobility  Bed Mobility Overal bed mobility: Needs Assistance Bed Mobility: Supine to Sit;Sit to Supine     Supine to sit: Min guard Sit to supine: Min guard      Transfers Overall transfer level: Needs assistance Equipment used: Rolling walker (2 wheeled);1 person hand held assist Transfers: Sit to/from Stand Sit to Stand: Min guard         General transfer comment: pt is noted to use push off from bed to stand  Ambulation/Gait Ambulation/Gait assistance: Min guard Gait Distance (Feet): 200 Feet Assistive device: Rolling walker (2 wheeled);1 person hand held assist Gait Pattern/deviations: Step-through pattern;Decreased stride length;Narrow base of support Gait velocity: reduced Gait velocity interpretation: <1.31 ft/sec, indicative of household ambulator General Gait Details: tends to walk outside walker at times but  then can correct  Stairs            Wheelchair Mobility    Modified Rankin (Stroke Patients Only)       Balance Overall balance assessment: Needs assistance Sitting-balance support: Feet supported Sitting balance-Leahy Scale: Good     Standing balance support: Bilateral upper extremity supported;During functional activity Standing balance-Leahy Scale: Fair                               Pertinent Vitals/Pain Pain Assessment: No/denies pain    Home Living Family/patient expects to be discharged to:: Private residence Living Arrangements: Spouse/significant other Available Help at Discharge: Family;Available 24 hours/day Type of Home: House Home Access: Stairs to enter   [employer] of Steps: 1 Home Layout: One level Home Equipment: Walker - 4 wheels;Cane - single point Additional Comments: pt has been using SPC in most recent times    Prior Function Level of Independence: Independent with assistive device(s)         Comments: husband holds onto her to come up one step to house     Hand Dominance   Dominant Hand: Right    Extremity/Trunk Assessment   Upper Extremity Assessment Upper Extremity Assessment: Overall WFL for tasks assessed    Lower Extremity Assessment Lower Extremity Assessment: RLE deficits/detail RLE Deficits / Details: 4+ RLE strength    Cervical / Trunk Assessment Cervical / Trunk Assessment: Normal  Communication   Communication: No difficulties  Cognition Arousal/Alertness: Awake/alert Behavior During Therapy: WFL for tasks assessed/performed Overall  Cognitive Status: Within Functional Limits for tasks assessed                                        General Comments      Exercises     Assessment/Plan    PT Assessment Patient needs continued PT services  PT Problem List Decreased strength;Decreased range of motion;Decreased activity tolerance;Decreased balance;Decreased  mobility;Decreased coordination;Cardiopulmonary status limiting activity;Decreased skin integrity;Pain       PT Treatment Interventions DME instruction;Gait training;Stair training;Functional mobility training;Therapeutic activities;Balance training;Therapeutic exercise;Neuromuscular re-education;Patient/family education    PT Goals (Current goals can be found in the Care Plan section)  Acute Rehab PT Goals Patient Stated Goal: to walk and get stronger PT Goal Formulation: With patient/family Time For Goal Achievement: 07/21/18 Potential to Achieve Goals: Good    Frequency Min 3X/week   Barriers to discharge Inaccessible home environment one step to enter house    Co-evaluation               AM-PAC PT "6 Clicks" Mobility  Outcome Measure Help needed turning from your back to your side while in a flat bed without using bedrails?: None Help needed moving from lying on your back to sitting on the side of a flat bed without using bedrails?: None Help needed moving to and from a bed to a chair (including a wheelchair)?: A Little Help needed standing up from a chair using your arms (e.g., wheelchair or bedside chair)?: A Little Help needed to walk in hospital room?: A Little Help needed climbing 3-5 steps with a railing? : A Lot 6 Click Score: 19    End of Session Equipment Utilized During Treatment: Gait belt Activity Tolerance: Patient tolerated treatment well;Patient limited by fatigue Patient left: in bed;with call bell/phone within reach;with bed alarm set;with family/visitor present Nurse Communication: Mobility status PT Visit Diagnosis: Muscle weakness (generalized) (M62.81);Difficulty in walking, not elsewhere classified (R26.2);Pain Pain - Right/Left: Right Pain - part of body: Hip(groin)    Time: 6203-5597 PT Time Calculation (min) (ACUTE ONLY): 30 min   Charges:   PT Evaluation $PT Eval Moderate Complexity: 1 Mod PT Treatments $Gait Training: 8-22 mins        Ramond Dial 07/14/2018, 12:15 PM  Mee Hives, PT MS Acute Rehab Dept. Number: [place]

## 2018-07-14 NOTE — Progress Notes (Signed)
HD tx ended 9 min early  @ 1610 d/t pt's altered LOC, found bp had dropped to 85 suddenly, UF turned off, put in trendlenberg and a NS 200 ml bolus admin resolved issue. Primary nurse had admin bp med prior to pt having HD tx tonight.  UF goal met Blood rinsed back VSS Report called to Janeann Merl, RN

## 2018-07-14 NOTE — Progress Notes (Signed)
[facility]    NEPHROLOGY PROGRESS NOTE  SUBJECTIVE: Feeling well today without acute complaints.  Denies chest pain, shortness of breath, nausea, vomiting, diarrhea or dysuria.  In good spirits.  No acute events.  All other review of systems are negative.    OBJECTIVE:  Vitals:   07/14/18 0948 07/14/18 1153  BP: (!) 124/44 (!) 104/36  Pulse: 79 71  Resp:  20  Temp:  99.2 F (37.3 C)  SpO2:  96%   I/O last 3 completed shifts: In: 67 [P.O.:360; I.V.:10] Out: 2264 [Other:2264]   [place]:  AAOx3 NAD HEENT: MMM [place] AT anicteric sclera Neck:  No JVD, no adenopathy CV:  Heart RRR  Lungs:  L/S CTA bilaterally Abd:  abd SNT/ND with normal BS GU:  Bladder non-palpable Extremities:  No LE edema.  Left upper extremity AV fistula with a good thrill and bruit. Skin:  No skin rash  MEDICATIONS:   Current Facility-Administered Medications:  .  0.9 %  sodium chloride infusion, 250 mL, Intravenous, PRN, Ahmed Prima, [place] M, PA-C .  0.9 %  sodium chloride infusion, , Intravenous, Continuous, Strader, [place] M, PA-C .  0.9 %  sodium chloride infusion, 100 mL, Intravenous, PRN, Rosita Fire, MD .  0.9 %  sodium chloride infusion, 100 mL, Intravenous, PRN, Rosita Fire, MD .  ALPRAZolam Duanne Moron) tablet 0.25 mg, 0.25 mg, Oral, BID PRN, [place], Peter M, MD .  alteplase (CATHFLO ACTIVASE) injection 2 mg, 2 mg, Intracatheter, Once PRN, Rosita Fire, MD .  bisacodyl (DULCOLAX) suppository 10 mg, 10 mg, Rectal, PRN, [place], Peter M, MD .  carvedilol (COREG) tablet 12.5 mg, 12.5 mg, Oral, BID WC, Dhungel, Nishant, MD .  cinacalcet (SENSIPAR) tablet 30 mg, 30 mg, Oral, QPC supper, [place], Peter M, MD, 30 mg at 07/13/18 1720 .  cloNIDine (CATAPRES) tablet 0.1 mg, 0.1 mg, Oral, Daily, [place], Peter M, MD, 0.1 mg at 07/13/18 1454 .  diphenhydrAMINE (BENADRYL) capsule 50 mg, 50 mg, Oral, Daily PRN, [place], Peter M, MD .  diphenoxylate-atropine (LOMOTIL)  2.5-0.025 MG per tablet 1 tablet, 1 tablet, Oral, QID PRN, [place], Peter M, MD .  epoetin alfa (EPOGEN,PROCRIT) injection 10,000 Units, 10,000 Units, Intravenous, Q M,W,F-HD, [place], Peter M, MD, 10,000 Units at 07/14/18 [phone] .  escitalopram (LEXAPRO) tablet 10 mg, 10 mg, Oral, Daily, [place], Peter M, MD, 10 mg at 07/14/18 0951 .  fluticasone (FLONASE) 50 MCG/ACT nasal spray 1 spray, 1 spray, Each Nare, QHS PRN, [place], Peter M, MD .  heparin injection 1,000 Units, 1,000 Units, Dialysis, PRN, Rosita Fire, MD .  heparin injection 5,000 Units, 5,000 Units, Subcutaneous, Q8H, [place], Peter M, MD, 5,000 Units at 07/14/18 [phone]  hydrALAZINE (APRESOLINE) injection 10 mg, 10 mg, Intravenous, Q6H PRN, [place], Peter M, MD .  hydrALAZINE (APRESOLINE) tablet 75 mg, 75 mg, Oral, BID, [place], Peter M, MD, 75 mg at 07/13/18 2205 .  isosorbide mononitrate (IMDUR) 24 hr tablet 120 mg, 120 mg, Oral, Daily, [place], Peter M, MD, 120 mg at 07/14/18 0951 .  lidocaine (PF) (XYLOCAINE) 1 % injection 5 mL, 5 mL, Intradermal, PRN, Rosita Fire, MD .  lidocaine-prilocaine (EMLA) cream 1 application, 1 application, Topical, Q M,W,F, [place], Peter M, MD .  lidocaine-prilocaine (EMLA) cream 1 application, 1 application, Topical, PRN, Rosita Fire, MD .  loratadine (CLARITIN) tablet 10 mg, 10 mg, Oral, Daily PRN, [place], Peter M, MD .  multivitamin (RENA-VIT) tablet 1 tablet, 1 tablet, Oral, Daily, [place], Peter  M, MD, 1 tablet at 07/14/18 0950 .  nitroGLYCERIN (NITROSTAT) SL tablet 0.4 mg, 0.4 mg, Sublingual, Q5 min PRN, [place], Peter M, MD .  ondansetron [facility]) injection 4 mg, 4 mg, Intravenous, Q6H PRN, [place], Peter M, MD .  oxyCODONE (Oxy IR/ROXICODONE) immediate release tablet 5 mg, 5 mg, Oral, Once, Schorr, Rhetta Mura, NP .  pantoprazole (PROTONIX) EC tablet 40 mg, 40 mg, Oral, Daily, [place], Peter M, MD, 40 mg at 07/14/18 0951 .  pentafluoroprop-tetrafluoroeth (GEBAUERS) aerosol 1  application, 1 application, Topical, PRN, Rosita Fire, MD .  sevelamer carbonate (RENVELA) tablet 2,400 mg, 2,400 mg, Oral, BID WC, [place], Peter M, MD, 2,400 mg at 07/14/18 [phone] .  sevelamer carbonate (RENVELA) tablet 800 mg, 800 mg, Oral, With snacks, [place], Peter M, MD .  simvastatin [facility]) tablet 20 mg, 20 mg, Oral, q1800, [place], Peter M, MD, 20 mg at 07/13/18 1720 .  sodium chloride flush (NS) 0.9 % injection 3 mL, 3 mL, Intravenous, Q12H, Strader, Brittany M, PA-C, 3 mL at 07/14/18 0952 .  sodium chloride flush (NS) 0.9 % injection 3 mL, 3 mL, Intravenous, PRN, Strader, [place] M, PA-C .  ticagrelor (BRILINTA) tablet 60 mg, 60 mg, Oral, BID, [place], Peter M, MD, 60 mg at 07/14/18 0950     LABS:  CBC Latest Ref Rng & Units 07/14/2018 07/13/2018 03/08/2018  WBC 4.0 - 10.5 K/uL 9.7 6.7 -  Hemoglobin 12.0 - 15.0 g/dL 10.6(L) 11.5(L) 9.8(L)  Hematocrit 36.0 - 46.0 % 33.4(L) 36.6 30.8(L)  Platelets 150 - 400 K/uL 159 155 -    CMP Latest Ref Rng & Units 07/14/2018 07/13/2018 02/13/2018  Glucose 70 - 99 mg/dL 133(H) 81 104(H)  BUN 8 - 23 mg/dL 73(H) 55(H) 48(H)  Creatinine 0.44 - 1.00 mg/dL 9.94(H) 7.98(H) 6.88(H)  Sodium 135 - 145 mmol/L 135 142 135  Potassium 3.5 - 5.1 mmol/L 4.9 3.8 4.8  Chloride 98 - 111 mmol/L 101 101 97(L)  CO2 22 - 32 mmol/L 21(L) 29 26  Calcium 8.9 - 10.3 mg/dL 8.1(L) 8.7(L) 8.7(L)  Total Protein 6.5 - 8.1 g/dL - - -  Total Bilirubin 0.3 - 1.2 mg/dL - - -  Alkaline Phos 38 - 126 U/L - - -  AST 15 - 41 U/L - - -  ALT 14 - 54 U/L - - -    Lab Results  Component Value Date   PTH 709.7 (H) 05/29/2013   CALCIUM 8.1 (L) 07/14/2018   CAION 1.06 (L) 07/26/2016   PHOS 2.7 07/14/2018       Component Value Date/Time   COLORURINE YELLOW 12/16/2012 1919   APPEARANCEUR CLOUDY (A) 12/16/2012 1919   LABSPEC 1.009 12/16/2012 1919   PHURINE 7.5 12/16/2012 1919   GLUCOSEU NEGATIVE 12/16/2012 1919   HGBUR TRACE (A) 12/16/2012 1919   BILIRUBINUR NEGATIVE  12/16/2012 1919   KETONESUR NEGATIVE 12/16/2012 1919   PROTEINUR 100 (A) 12/16/2012 1919   UROBILINOGEN 0.2 12/16/2012 1919   NITRITE NEGATIVE 12/16/2012 1919   LEUKOCYTESUR SMALL (A) 12/16/2012 1919      Component Value Date/Time   PHART 7.418 05/20/2016 0618   PCO2ART 41.9 05/20/2016 0618   PO2ART 112 (H) 05/20/2016 0618   HCO3 26.5 05/20/2016 0618   TCO2 28 07/26/2016 1847   ACIDBASEDEF 1.1 05/19/2016 0517   O2SAT 96.8 05/20/2016 0618       Component Value Date/Time   IRON 62 09/19/2016 2124   TIBC 230 (L) 09/19/2016 2124   FERRITIN 1,122 (H) 09/19/2016 2124  IRONPCTSAT 27 09/19/2016 2124       ASSESSMENT/PLAN:     Problem List Items Addressed This Visit    None    Visit Diagnoses    Chest pain, rule out acute myocardial infarction    -  Primary      Shawna [name] is an 78 y.o. female with history of CAD status post a stent and CABG, hypertension, hyperlipidemia, A. fib not on anticoagulation because of GI bleed, NSVT, ESRD on hemodialysis MWF at [facility] Dr. Lowanda Foster, admitted with chest pain for 1 day.  Patient reported the chest pain was on the left side and on back which has improved somewhat with nitroglycerin.  EKG showed LVH and ST depression in inferior and lateral leads compared to prior EKGs.  Patient receives dialysis MWF, [facility], Dr. Lowanda Foster, 3 hours 45 minutes, has a left upper extremity AV fistula.    She was transferred to [facility] for cardiac catheterization.  1.  ESRD on HD MWF.  S/p HD yesterday.  Will plan for HD on Monday. Volume status and lytes stable.   2.  Chest pain.  S/p cardiac cath 07/13/18.  Cardiology following. 3.  Anemia of CKD.  Hgb at goal.  Will defer management to OP. 4.  Hyperparathyroidism.  Phos stable.  [place], DO, [place]

## 2018-07-14 NOTE — Progress Notes (Signed)
Progress Note  Patient Name: Shawna [name] Date of Encounter: 07/14/2018  Primary Cardiologist: Kate Sable, MD   Subjective   No CP or dyspnea  Inpatient Medications    Scheduled Meds: . carvedilol  12.5 mg Oral BID WC  . cinacalcet  30 mg Oral QPC supper  . cloNIDine  0.1 mg Oral Daily  . epoetin alfa  10,000 Units Intravenous Q M,W,F-HD  . escitalopram  10 mg Oral Daily  . heparin  5,000 Units Subcutaneous Q8H  . hydrALAZINE  75 mg Oral BID  . isosorbide mononitrate  120 mg Oral Daily  . lidocaine-prilocaine  1 application Topical Q M,W,F  . multivitamin  1 tablet Oral Daily  . oxyCODONE  5 mg Oral Once  . pantoprazole  40 mg Oral Daily  . sevelamer carbonate  2,400 mg Oral BID WC  . sevelamer carbonate  800 mg Oral With snacks  . simvastatin  20 mg Oral q1800  . sodium chloride flush  3 mL Intravenous Q12H  . ticagrelor  60 mg Oral BID   Continuous Infusions: . sodium chloride    . sodium chloride    . sodium chloride    . sodium chloride     PRN Meds: sodium chloride, sodium chloride, sodium chloride, ALPRAZolam, alteplase, bisacodyl, diphenhydrAMINE, diphenoxylate-atropine, fluticasone, heparin, hydrALAZINE, lidocaine (PF), lidocaine-prilocaine, loratadine, nitroGLYCERIN, ondansetron (ZOFRAN) IV, pentafluoroprop-tetrafluoroeth, sodium chloride flush   Vital Signs    Vitals:   07/14/18 0851 07/14/18 0948 07/14/18 1153 07/14/18 1319  BP: (!) 117/49 (!) 124/44 (!) 104/36 (!) 124/44  Pulse: 75 79 71 79  Resp:   20   Temp:   99.2 F (37.3 C)   TempSrc:   Oral   SpO2:   96%   Weight:      Height:        Intake/Output Summary (Last 24 hours) at 07/14/2018 1334 Last data filed at 07/14/2018 0456 Gross per 24 hour  Intake 370 ml  Output 2264 ml  Net -1894 ml   Filed Weights   07/14/18 0051 07/14/18 0430 07/14/18 0517  Weight: 58.9 kg 58.1 kg 57.7 kg    Telemetry    Sinus with PVC- Personally Reviewed   Physical Exam   GEN: No acute distress.    Neck: No JVD Cardiac: RRR, no murmurs, rubs, or gallops.  Respiratory: Clear to auscultation bilaterally. GI: Soft, nontender, non-distended  MS: No edema; Right groin with no hematoma and no bruit Neuro:  Nonfocal  Psych: Normal affect   Labs    Chemistry Recent Labs  Lab 07/13/18 0203 07/14/18 0200  NA 142 135  K 3.8 4.9  CL 101 101  CO2 29 21*  GLUCOSE 81 133*  BUN 55* 73*  CREATININE 7.98* 9.94*  CALCIUM 8.7* 8.1*  ALBUMIN  --  2.9*  GFRNONAA 4* 3*  GFRAA 5* 4*  ANIONGAP 12 13     Hematology Recent Labs  Lab 07/13/18 0203 07/14/18 0200  WBC 6.7 9.7  RBC 3.30* 3.14*  HGB 11.5* 10.6*  HCT 36.6 33.4*  MCV 110.9* 106.4*  MCH 34.8* 33.8  MCHC 31.4 31.7  RDW 14.3 14.4  PLT 155 159    Cardiac Enzymes Recent Labs  Lab 07/13/18 0203 07/13/18 0836  TROPONINI 0.05* 0.06*    Radiology    Dg Chest 2 View  Result Date: 07/13/2018 CLINICAL DATA:  Chest pain EXAM: CHEST - 2 VIEW COMPARISON:  03/23/2018 FINDINGS: Pain mild cardiomegaly with prior CABG. Right-greater-than-left basilar atelectasis. No  focal airspace consolidation or pulmonary edema. IMPRESSION: No active cardiopulmonary disease. Electronically Signed   By: Ulyses Jarred M.D.   On: 07/13/2018 02:09    Patient Profile     78 y.o. female with past medical history of coronary artery disease status post prior coronary artery bypass and graft, end-stage renal disease dialysis dependent, paroxysmal atrial fibrillation not on anticoagulation with history of GI bleed, hypertension, hyperlipidemia admitted with chest pain.  Assessment & Plan    1 chest pain-enzymes not consistent with acute coronary syndrome.  Cardiac catheterization results noted.  Plan is for medical therapy unless refractory symptoms and then could attempt PCI of PDA.  Continue aspirin, beta-blocker, Brilinta and statin.  2 hypertension-patient's blood pressure has improved.  Continue present medications and follow as an  outpatient.  3 history of atrial fibrillation-she is not on anticoagulation because of history of GI bleed by report.  Continue beta-blocker.  Remains in sinus rhythm.  Would resume amiodarone 200 mg daily at discharge.  4 end-stage renal disease-dialysis per nephrology (plan is for next session on Monday).  Plan discharge today.  Follow-up APP in reasonable in 2 to 4 weeks. Greater than 30 minutes PA and physician time. D2  For questions or updates, please contact [place] Please consult [email or web address] for contact info under        Signed, Kirk Ruths, MD  07/14/2018, 1:34 PM

## 2018-07-16 ENCOUNTER — Encounter (HOSPITAL_COMMUNITY): Payer: Self-pay | Admitting: Cardiology

## 2018-07-23 DIAGNOSIS — D631 Anemia in chronic kidney disease: Secondary | ICD-10-CM | POA: Diagnosis not present

## 2018-07-23 DIAGNOSIS — N2581 Secondary hyperparathyroidism of renal origin: Secondary | ICD-10-CM | POA: Diagnosis not present

## 2018-07-31 DIAGNOSIS — S39011A Strain of muscle, fascia and tendon of abdomen, initial encounter: Secondary | ICD-10-CM | POA: Diagnosis not present

## 2018-07-31 DIAGNOSIS — Z6824 Body mass index (BMI) 24.0-24.9, adult: Secondary | ICD-10-CM | POA: Diagnosis not present

## 2018-07-31 DIAGNOSIS — I472 Ventricular tachycardia: Secondary | ICD-10-CM | POA: Diagnosis not present

## 2018-07-31 DIAGNOSIS — I4891 Unspecified atrial fibrillation: Secondary | ICD-10-CM | POA: Diagnosis not present

## 2018-07-31 DIAGNOSIS — N186 End stage renal disease: Secondary | ICD-10-CM | POA: Diagnosis not present

## 2018-07-31 DIAGNOSIS — N185 Chronic kidney disease, stage 5: Secondary | ICD-10-CM | POA: Diagnosis not present

## 2018-07-31 DIAGNOSIS — I259 Chronic ischemic heart disease, unspecified: Secondary | ICD-10-CM | POA: Diagnosis not present

## 2018-07-31 DIAGNOSIS — Z992 Dependence on renal dialysis: Secondary | ICD-10-CM | POA: Diagnosis not present

## 2018-08-06 DIAGNOSIS — N186 End stage renal disease: Secondary | ICD-10-CM | POA: Diagnosis not present

## 2018-08-06 DIAGNOSIS — D509 Iron deficiency anemia, unspecified: Secondary | ICD-10-CM | POA: Diagnosis not present

## 2018-08-06 DIAGNOSIS — N2581 Secondary hyperparathyroidism of renal origin: Secondary | ICD-10-CM | POA: Diagnosis not present

## 2018-08-06 DIAGNOSIS — Z992 Dependence on renal dialysis: Secondary | ICD-10-CM | POA: Diagnosis not present

## 2018-08-06 DIAGNOSIS — I480 Paroxysmal atrial fibrillation: Secondary | ICD-10-CM

## 2018-08-06 NOTE — Progress Notes (Signed)
Cardiology Office Note    Date:  08/07/2018   ID:  Shawna [name], Cancel June 22, 1941, MRN [id number]  PCP:  Shawna Percy, MD  Cardiologist: Shawna Sable, MD EPS: None  Chief Complaint  Patient presents with  . Hospitalization Follow-up    History of Present Illness:  Shawna [name] is a 78 y.o. female with history of CAD status post CABG in 2013, and NSTEMI treated with DES to diagonal one 10/2016 with patent LIMA to the LAD, SVG to the RI, SVG to the marginal, SVG to the RCA.  NST 02/2018 no ischemia.  Patient was admitted to the hospital with chest pain 06/2018 enzymes flat.  Cardiac catheterization 07/13/2018 showed occlusion of the diagonal, progression of disease in the PDA, unclear if this was the reason for her chest pain.  She was severely hypertensive and the EDP was mildly elevated.  Medical management with good blood pressure control was recommended.  If she has refractory angina despite good BP and volume management would consider PCI of the PDA.  Patient also has ESRD on HD, HTN, HLD, PAF/NSVT on amiodarone.  Not on anticoagulation with history of GI bleed.  Patient comes in today alone for f/u. She says BP drops at dialysis.BP going up to 189 at night. Not taking her meds when her BP is 120 so it has been going up and down. Scared to take it because of the drops during dialysis. Says she gets dizzy if her BP is 117. Had some angina Sunday relieved with NTG.Complains of hairloss and weight loss from amiodarone.  Dr. Bronson Ing had stopped it back in December and she said it got better but it was restarted in the hospital.  Past Medical History:  Diagnosis Date  . Acute on chronic respiratory failure with hypoxia ([place]) 10/10/2016  . Anxiety   . Arthritis   . AVM (arteriovenous malformation) of colon   . CAD (coronary artery disease)    a. s/p CABG in 2013 b. most recent DES to D1 in 10/2016 with cath showing patent LIMA-LAD, SVG-RI, SVG-Mrg and SVG-RCA  c. NST in 02/2018 showing  scar with no significant ischemia  . Carotid artery disease ([place])    a. 69-67% LICA, 02/9380   . Chronic bronchitis ([place])   . Chronic diastolic CHF (congestive heart failure) ([place])    a. 02/2012 Echo EF 60-65%, nl wall motion, Gr 1 DD, mod MR  . Colon cancer ([place]) 1992  . Esophageal stricture   . ESRD on hemodialysis ([place])    ESRD due to HTN, started dialysis 2011 and gets HD at [facility] with Dr Hinda Lenis on MWF schedule.  Access is LUA AVF as of Sept 2014.   Marland Kitchen GERD (gastroesophageal reflux disease)   . High cholesterol 12/2011  . History of blood transfusion 07/2011; 12/2011; 01/2012 X 2; 04/2012  . History of gout   . History of lower GI bleeding   . Hypertension   . Iron deficiency anemia   . Mitral regurgitation    a. Moderate by echo, 02/2012  . Myocardial infarction ([place])   . Ovarian cancer ([place]) 1992  . Pneumonia ~ 2009  . PUD (peptic ulcer disease)   . TIA (transient ischemic attack)     Past Surgical History:  Procedure Laterality Date  . ABDOMINAL HYSTERECTOMY  1992  . APPENDECTOMY  06/1990  . AV FISTULA PLACEMENT  07/2009   left upper arm  . AV FISTULA PLACEMENT Right 09/06/2016   Procedure: RIGHT FOREARM ARTERIOVENOUS (  AV) GRAFT;  Surgeon: Elam Dutch, MD;  Location: [facility] OR;  Service: Vascular;  Laterality: Right;  . AV FISTULA PLACEMENT N/A 02/24/2017   Procedure: INSERTION OF ARTERIOVENOUS (AV) GORE-TEX GRAFT ARM (BRACHIAL AXILLARY);  Surgeon: Katha Cabal, MD;  Location: [facility] ORS;  Service: Vascular;  Laterality: N/A;  . [place] Right 09/06/2016   Procedure: REMOVAL OF Right Arm ARTERIOVENOUS GORETEX GRAFT and Vein Patch angioplasty of brachial artery;  Surgeon: Angelia Mould, MD;  Location: [place];  Service: Vascular;  Laterality: Right;  . COLON RESECTION  1992  . COLON SURGERY    . CORONARY ANGIOPLASTY WITH STENT PLACEMENT  12/15/11   "2"  . CORONARY ANGIOPLASTY WITH STENT PLACEMENT  y/2013   "1; makes total of 3" (05/02/2012)  . CORONARY  ARTERY BYPASS GRAFT  06/13/2012   Procedure: CORONARY ARTERY BYPASS GRAFTING (CABG);  Surgeon: Grace Isaac, MD;  Location: [place];  Service: Open Heart Surgery;  Laterality: N/A;  cabg x four;  using left internal mammary artery, and left leg greater saphenous vein harvested endoscopically  . CORONARY STENT INTERVENTION N/A 10/13/2016   Procedure: Coronary Stent Intervention;  Surgeon: Troy Sine, MD;  Location: [place] CV LAB;  Service: Cardiovascular;  Laterality: N/A;  . DIALYSIS/PERMA CATHETER REMOVAL N/A 04/18/2017   Procedure: DIALYSIS/PERMA CATHETER REMOVAL;  Surgeon: Katha Cabal, MD;  Location: [place] CV LAB;  Service: Cardiovascular;  Laterality: N/A;  . DILATION AND CURETTAGE OF UTERUS    . ESOPHAGOGASTRODUODENOSCOPY  01/20/2012   Procedure: ESOPHAGOGASTRODUODENOSCOPY (EGD);  Surgeon: Ladene Artist, MD,FACG;  [facility] ENDOSCOPY;  Service: Endoscopy;  Laterality: N/A;  . ESOPHAGOGASTRODUODENOSCOPY N/A 03/26/2013   Procedure: ESOPHAGOGASTRODUODENOSCOPY (EGD);  Surgeon: Irene Shipper, MD;  Location: [facility] ENDOSCOPY;  Service: Endoscopy;  Laterality: N/A;  . ESOPHAGOGASTRODUODENOSCOPY N/A 04/30/2015   Procedure: ESOPHAGOGASTRODUODENOSCOPY (EGD);  Surgeon: Rogene Houston, MD;  [facility]O [facility];  Service: Endoscopy;  Laterality: N/A;  1pm - moved to 10/20 @ 1:10  . ESOPHAGOGASTRODUODENOSCOPY N/A 07/29/2016   Procedure: ESOPHAGOGASTRODUODENOSCOPY (EGD);  Surgeon: Manus Gunning, MD;  Location: [place];  Service: Gastroenterology;  Laterality: N/A;  enteroscopy  . INTRAOPERATIVE TRANSESOPHAGEAL ECHOCARDIOGRAM  06/13/2012   Procedure: INTRAOPERATIVE TRANSESOPHAGEAL ECHOCARDIOGRAM;  Surgeon: Grace Isaac, MD;  Location: [place];  Service: Open Heart Surgery;  Laterality: N/A;  . IR GENERIC HISTORICAL  07/26/2016   IR FLUORO GUIDE CV LINE RIGHT 07/26/2016 Greggory Keen, MD [facility]INTER[facility]  . IR GENERIC HISTORICAL  07/26/2016   IR US GUIDE VASC ACCESS RIGHT  07/26/2016 Greggory Keen, MD [facility]  . IR GENERIC HISTORICAL  08/02/2016   IR US GUIDE VASC ACCESS RIGHT 08/02/2016 Greggory Keen, MD [facility]  . IR GENERIC HISTORICAL  08/02/2016   IR FLUORO GUIDE CV LINE RIGHT 08/02/2016 Greggory Keen, MD [facility]  . IR RADIOLOGY PERIPHERAL GUIDED IV START  03/28/2017  . IR US GUIDE VASC ACCESS RIGHT  03/28/2017  . LEFT HEART CATH AND CORONARY ANGIOGRAPHY N/A 09/20/2016   Procedure: Left Heart Cath and Coronary Angiography;  Surgeon: [name], MD;  Location: [place] CV LAB;  Service: Cardiovascular;  Laterality: N/A;  . LEFT HEART CATH AND CORS/GRAFTS ANGIOGRAPHY N/A 10/13/2016   Procedure: Left Heart Cath and Cors/Grafts Angiography;  Surgeon: Troy Sine, MD;  Location: [place] CV LAB;  Service: Cardiovascular;  Laterality: N/A;  . LEFT HEART CATH AND CORS/GRAFTS ANGIOGRAPHY N/A 07/13/2018   Procedure: LEFT HEART CATH AND CORS/GRAFTS  ANGIOGRAPHY;  Surgeon: Martinique, Peter M, MD;  Location: [place] CV LAB;  Service: Cardiovascular;  Laterality: N/A;  . LEFT HEART CATHETERIZATION WITH CORONARY ANGIOGRAM N/A 12/15/2011   Procedure: LEFT HEART CATHETERIZATION WITH CORONARY ANGIOGRAM;  Surgeon: Burnell Blanks, MD;  Location: [facility] CATH LAB;  Service: Cardiovascular;  Laterality: N/A;  . LEFT HEART CATHETERIZATION WITH CORONARY ANGIOGRAM N/A 01/10/2012   Procedure: LEFT HEART CATHETERIZATION WITH CORONARY ANGIOGRAM;  Surgeon: Peter M Martinique, MD;  Location: [facility] CATH LAB;  Service: Cardiovascular;  Laterality: N/A;  . LEFT HEART CATHETERIZATION WITH CORONARY ANGIOGRAM N/A 06/08/2012   Procedure: LEFT HEART CATHETERIZATION WITH CORONARY ANGIOGRAM;  Surgeon: Burnell Blanks, MD;  Location: [facility] CATH LAB;  Service: Cardiovascular;  Laterality: N/A;  . LEFT HEART CATHETERIZATION WITH CORONARY/GRAFT ANGIOGRAM N/A 12/10/2013   Procedure: LEFT HEART CATHETERIZATION WITH Beatrix Fetters;  Surgeon: [name], MD;  Location: [facility] CATH  LAB;  Service: Cardiovascular;  Laterality: N/A;  . OVARY SURGERY     ovarian cancer  . REVISION OF ARTERIOVENOUS GORETEX GRAFT N/A 02/24/2017   Procedure: REVISION OF ARTERIOVENOUS GORETEX GRAFT (RESECTION);  Surgeon: Katha Cabal, MD;  Location: [facility] ORS;  Service: Vascular;  Laterality: N/A;  [place] N/A 10/15/2013   Procedure: Fistulogram;  Surgeon: Serafina Mitchell, MD;  Location: [facility] CATH LAB;  Service: Cardiovascular;  Laterality: N/A;  . THROMBECTOMY / ARTERIOVENOUS GRAFT REVISION  2011   left upper arm  . TUBAL LIGATION  1980's  . UPPER EXTREMITY ANGIOGRAPHY Bilateral 12/06/2016   Procedure: Upper Extremity Angiography;  Surgeon: Katha Cabal, MD;  Location: [place] CV LAB;  Service: Cardiovascular;  Laterality: Bilateral;  . UPPER EXTREMITY INTERVENTION Left 06/06/2017   Procedure: UPPER EXTREMITY INTERVENTION;  Surgeon: Katha Cabal, MD;  Location: [place] CV LAB;  Service: Cardiovascular;  Laterality: Left;    Current Medications: Current Meds  Medication Sig  . albuterol (PROVENTIL) (2.5 MG/3ML) 0.083% nebulizer solution Inhale 3 mLs into the lungs every 4 (four) hours as needed.  . ALPRAZolam (XANAX) 0.25 MG tablet Take 1 tablet (0.25 mg total) by mouth 2 (two) times daily as needed for anxiety or sleep.  Marland Kitchen amiodarone (PACERONE) 200 MG tablet Take 1 tablet (200 mg total) by mouth daily.  Marland Kitchen aspirin EC 81 MG tablet Take 1 tablet (81 mg total) by mouth daily.  . bisacodyl (DULCOLAX) 10 MG suppository Place 1 suppository (10 mg total) rectally as needed for moderate constipation.  . BRILINTA 60 MG TABS tablet TAKE 1 TABLET BY MOUTH TWICE DAILY  . carvedilol (COREG) 12.5 MG tablet Take 1 tablet (12.5 mg total) by mouth 2 (two) times daily with a meal.  . cinacalcet (SENSIPAR) 30 MG tablet Take 30 mg by mouth daily after supper.  . cloNIDine (CATAPRES) 0.2 MG tablet Take 0.1-0.2 mg by mouth daily as needed (high blood pressure).   . diphenhydrAMINE  (BENADRYL) 25 mg capsule Take 50 mg by mouth daily as needed for itching or allergies.   . diphenoxylate-atropine (LOMOTIL) 2.5-0.025 MG tablet Take 1 tablet by mouth 4 (four) times daily as needed for diarrhea or loose stools.  Marland Kitchen epoetin alfa (EPOGEN,PROCRIT) 16109 UNIT/ML injection Inject 1 mL (10,000 Units total) into the vein every Monday, Wednesday, and Friday with hemodialysis.  Marland Kitchen escitalopram (LEXAPRO) 10 MG tablet Take 1 tablet (10 mg total) by mouth daily.  . fluticasone (FLONASE) 50 MCG/ACT nasal spray Place 1 spray at bedtime as needed into both nostrils for allergies.   Marland Kitchen  hydrALAZINE (APRESOLINE) 50 MG tablet Take 75 mg by mouth 2 (two) times daily as needed.   . isosorbide mononitrate (IMDUR) 120 MG 24 hr tablet TAKE 1 TABLET BY MOUTH ONCE DAILY  . lidocaine-prilocaine (EMLA) cream Apply 1 application topically every Monday, Wednesday, and Friday. Prior to dialysis  . loratadine (CLARITIN) 10 MG tablet Take 1 tablet (10 mg total) by mouth daily as needed for allergies.  . multivitamin (RENA-VIT) TABS tablet Take 1 tablet by mouth daily.  . nitroGLYCERIN (NITROSTAT) 0.4 MG SL tablet Place 1 tablet (0.4 mg total) under the tongue every 5 (five) minutes as needed for chest pain.  Marland Kitchen nystatin-triamcinolone (MYCOLOG II) cream Apply to the affected area twice a day  . omeprazole (PRILOSEC) 20 MG capsule Take 1 capsule (20 mg total) by mouth daily.  . ondansetron (ZOFRAN-ODT) 4 MG disintegrating tablet Take 4 mg by mouth every 8 (eight) hours as needed for nausea or vomiting.   . sevelamer carbonate (RENVELA) 800 MG tablet Take 800-2,400 mg by mouth See admin instructions. Take 3 tablets (2400) by mouth twice daily with meals, take 1 tablet (800 mg) with snacks  . simvastatin (ZOCOR) 20 MG tablet TAKE ONE TABLET (20 MG) BY MOUTH DAILY AT BEDTIME     Allergies:   Aspirin; Penicillins; Amlodipine; Bactrim [sulfamethoxazole-trimethoprim]; Contrast media [iodinated diagnostic agents]; Iron;  Nitrofurantoin; Tylenol [acetaminophen]; Gabapentin; Dexilant [dexlansoprazole]; Levaquin [levofloxacin in d5w]; Morphine and related; Plavix [clopidogrel bisulfate]; Protonix [pantoprazole sodium]; and Venofer [ferric oxide]   Social History   Socioeconomic History  . Marital status: Married    Spouse name: Not on file  . Number of children: Not on file  . Years of education: Not on file  . Highest education level: Not on file  Occupational History  . Not on file  Social Needs  . Financial resource strain: Not on file  . Food insecurity:    Worry: Not on file    Inability: Not on file  . Transportation needs:    Medical: Not on file    Non-medical: Not on file  Tobacco Use  . Smoking status: Never Smoker  . Smokeless tobacco: Never Used  Substance and Sexual Activity  . Alcohol use: No    Alcohol/week: 0.0 standard drinks  . Drug use: No  . Sexual activity: Yes    Birth control/protection: Surgical  Lifestyle  . Physical activity:    Days per week: Not on file    Minutes per session: Not on file  . Stress: Not on file  Relationships  . Social connections:    Talks on phone: Not on file    Gets together: Not on file    Attends religious service: Not on file    Active member of club or organization: Not on file    Attends meetings of clubs or organizations: Not on file    Relationship status: Not on file  Other Topics Concern  . Not on file  Social History Narrative   Lives in [place], [place] with husband.  Dialysis pt - [facility].     Family History:  The patient's   family history includes Diabetes in her brother, father, mother, and sister; Heart attack in her brother, brother, and mother; Heart disease in her father and mother; Hyperlipidemia in her brother, father, and mother; Hypertension in her father, mother, sister, and sister; Other in an other family member.   ROS:   Please see the history of present illness.    ROS All other systems  reviewed and are  negative.   PHYSICAL EXAM:   VS:  BP 122/66 (BP Location: Right Arm)   Pulse 76   Ht 5\' 1"  (1.549 m)   Wt 127 lb (57.6 kg)   SpO2 95%   BMI 24.00 kg/m   Physical Exam  GEN: Well nourished, well developed, in no acute distress  Neck: no JVD, carotid bruits, or masses Cardiac:RRR; no murmurs, rubs, or gallops  Respiratory:  clear to auscultation bilaterally, normal work of breathing GI: soft, nontender, nondistended, + BS Ext: Right groin at cath site without hematoma or hemorrhage, good distal pulses without cyanosis, clubbing, or edema, Good distal pulses bilaterally Neuro:  Alert and Oriented x 3,  Psych: euthymic mood, full affect  Wt Readings from Last 3 Encounters:  08/07/18 127 lb (57.6 kg)  07/14/18 127 lb 3.3 oz (57.7 kg)  05/10/18 121 lb (54.9 kg)      Studies/Labs Reviewed:   EKG:  EKG is not ordered today.  Recent Labs: 08/13/2017: ALT 23 07/14/2018: BUN 73; Creatinine, Ser 9.94; Hemoglobin 10.6; Platelets 159; Potassium 4.9; Sodium 135   Lipid Panel    Component Value Date/Time   CHOL 93 10/16/2017 0539   TRIG 56 10/16/2017 0539   HDL 56 10/16/2017 0539   CHOLHDL 1.7 10/16/2017 0539   VLDL 11 10/16/2017 0539   LDLCALC 26 10/16/2017 0539    Additional studies/ records that were reviewed today include:   Cardiac catheterization 1/3/2020Ost RPDA to RPDA lesion is 75% stenosed.  Mid LM to Dist LM lesion is 60% stenosed.  Ost Ramus to Ramus lesion is 80% stenosed.  Ost Cx to Mid Cx lesion is 99% stenosed.  Prox LAD lesion is 90% stenosed.  Ost 1st Diag to 1st Diag lesion is 100% stenosed.  Ost RCA to Prox RCA lesion is 100% stenosed.  LIMA graft was visualized by angiography and is large.  The graft exhibits no disease.  SVG graft was visualized by angiography and is large.  The graft exhibits no disease.  SVG graft was visualized by angiography and is normal in caliber.  SVG graft was visualized by angiography and is large.  The graft  exhibits no disease.  The left ventricular systolic function is normal.  LV end diastolic pressure is mildly elevated.  The left ventricular ejection fraction is 55-65% by visual estimate.   1. Severe 3 vessel occlusive CAD    - 60% distal left main    - 90% mid LAD    - 100% first diagonal at prior stent site    - 99% proximal LCx at prior stent site    - 80% ramus intermediate.    - 100% ostial RCA at prior stent site    - 75% proximal PDA 2. Patent LIMA to the LAD 3. Patent SVG to ramus intermediate 4. Patent SVG to OM 5. Patent SVG to the distal RCA. 6. Good LV function 7. Mildly elevated LVEDP   Plan: Compared to prior cath in April 2018 the diagonal is now occluded. There is progression of disease in the PDA. It is unclear if this is the reason for her chest pain. She is severely hypertensive and EDP is mildly elevated. I would recommend medical management with good BP control. If she has refractory angina despite good BP and volume management we could consider PCI of the PDA but this would have to be done through the SVG and in general she has not responded well to stenting in the past with  all prior stents occluded. I will increase her Coreg dose. She will need dialysis in am.      ASSESSMENT:    1. Atherosclerosis of native coronary artery of native heart without angina pectoris   2. Essential hypertension   3. Mixed hyperlipidemia   4. PAF (paroxysmal atrial fibrillation) ([place])   5. ESRD on hemodialysis ([place])   6. Chronic diastolic CHF (congestive heart failure) ([facility])      PLAN:  In order of problems listed above:  CAD status post CABG in 2013, NSTEMI treated with DES to the diagonal 10/2016 recent admission for chest pain and cardiac cath 07/13/2018 with occluded diagonal and progression of disease in the PDA now 75%, grafts patent and good LV function.  Medical therapy recommended,.  PCI of the PDA could be considered if refractory angina once blood pressure was  under good control and volume status was stable.  One episode of angina Sunday relieved with nitroglycerin.  Patient is not taking her medications regularly.  Long discussion about the importance of compliance with her medications and how to take before and after dialysis.  We will also ask home health nurse to come help her with this.  Essential hypertension blood pressure up and down because patient is taking her medications at random times.  Long discussion about the importance of taking them regularly.  Hyperlipidemia on simvastatin LDL at goal 10/2017  History of PAF/NSVT on amiodarone.  No anticoagulation because of history of GI bleed.  Amiodarone was stopped in December because of complaints of hair loss and weight loss but was restarted in the hospital.  Can discuss further with Dr. Bronson Ing at next office visit.  ESRD on HD  Chronic diastolic CHF managed with dialysis compensated.    Medication Adjustments/Labs and Tests Ordered: Current medicines are reviewed at length with the patient today.  Concerns regarding medicines are outlined above.  Medication changes, Labs and Tests ordered today are listed in the Patient Instructions below. Patient Instructions  Medication Instructions:  Your physician recommends that you continue on your current medications as directed. Please refer to the Current Medication list given to you today.  If you need a refill on your cardiac medications before your next appointment, please call your pharmacy.   Lab work: None today If you have labs (blood work) drawn today and your tests are completely normal, you will receive your results only by: Marland Kitchen MyChart Message (if you have MyChart) OR . A paper copy in the mail If you have any lab test that is abnormal or we need to change your treatment, we will call you to review the results.  Testing/Procedures: None today  Follow-Up: At [facility], you and your health needs are our priority.  As part  of our continuing mission to provide you with exceptional heart care, we have created designated Provider Care Teams.  These Care Teams include your primary Cardiologist (physician) and Advanced Practice Providers (APPs -  Physician Assistants and Nurse Practitioners) who all work together to provide you with the care you need, when you need it. You will need a follow up appointment in 2 months.  Please call our office 2 months in advance to schedule this appointment.  You may see Shawna Sable, MD or one of the following Advanced Practice Providers on your designated Care Team:   Bernerd Pho, PA-C [facility]) . Ermalinda Barrios, PA-C ([place])  Any Other Special Instructions Will Be Listed Below (If Applicable).  I will put in  a Home Health request with [facility] to see if they can help you with your medications    Signed, Ermalinda Barrios, PA-C  08/07/2018 1:09 PM    [address], [place], [place]  [address] Phone: [phone]; Fax: [phone]

## 2018-08-07 ENCOUNTER — Ambulatory Visit: Payer: Medicare HMO | Admitting: Physician Assistant

## 2018-08-07 ENCOUNTER — Encounter: Payer: Self-pay | Admitting: Physician Assistant

## 2018-08-07 VITALS — BP 122/66 | HR 76 | Ht 61.0 in | Wt 127.0 lb

## 2018-08-07 DIAGNOSIS — I480 Paroxysmal atrial fibrillation: Secondary | ICD-10-CM

## 2018-08-07 DIAGNOSIS — Z992 Dependence on renal dialysis: Secondary | ICD-10-CM

## 2018-08-07 DIAGNOSIS — I5032 Chronic diastolic (congestive) heart failure: Secondary | ICD-10-CM | POA: Diagnosis not present

## 2018-08-07 DIAGNOSIS — I10 Essential (primary) hypertension: Secondary | ICD-10-CM | POA: Diagnosis not present

## 2018-08-07 DIAGNOSIS — I2510 Atherosclerotic heart disease of native coronary artery without angina pectoris: Secondary | ICD-10-CM | POA: Diagnosis not present

## 2018-08-07 DIAGNOSIS — N186 End stage renal disease: Secondary | ICD-10-CM

## 2018-08-07 DIAGNOSIS — E782 Mixed hyperlipidemia: Secondary | ICD-10-CM | POA: Diagnosis not present

## 2018-08-07 NOTE — Patient Instructions (Signed)
Medication Instructions:  Your physician recommends that you continue on your current medications as directed. Please refer to the Current Medication list given to you today.  If you need a refill on your cardiac medications before your next appointment, please call your pharmacy.   Lab work: None today If you have labs (blood work) drawn today and your tests are completely normal, you will receive your results only by: Marland Kitchen MyChart Message (if you have MyChart) OR . A paper copy in the mail If you have any lab test that is abnormal or we need to change your treatment, we will call you to review the results.  Testing/Procedures: None today  Follow-Up: At [facility], you and your health needs are our priority.  As part of our continuing mission to provide you with exceptional heart care, we have created designated Provider Care Teams.  These Care Teams include your primary Cardiologist (physician) and Advanced Practice Providers (APPs -  Physician Assistants and Nurse Practitioners) who all work together to provide you with the care you need, when you need it. You will need a follow up appointment in 2 months.  Please call our office 2 months in advance to schedule this appointment.  You may see Kate Sable, MD or one of the following Advanced Practice Providers on your designated Care Team:   Bernerd Pho, PA-C [facility]) . Ermalinda Barrios, PA-C ([place])  Any Other Special Instructions Will Be Listed Below (If Applicable).  I will put in a [place] request with [facility] to see if they can help you with your medications

## 2018-08-10 DIAGNOSIS — N186 End stage renal disease: Secondary | ICD-10-CM | POA: Diagnosis not present

## 2018-08-10 DIAGNOSIS — Z992 Dependence on renal dialysis: Secondary | ICD-10-CM | POA: Diagnosis not present

## 2018-08-13 DIAGNOSIS — Z992 Dependence on renal dialysis: Secondary | ICD-10-CM | POA: Diagnosis not present

## 2018-08-13 DIAGNOSIS — N186 End stage renal disease: Secondary | ICD-10-CM | POA: Diagnosis not present

## 2018-08-15 DIAGNOSIS — N186 End stage renal disease: Secondary | ICD-10-CM | POA: Diagnosis not present

## 2018-08-15 DIAGNOSIS — Z992 Dependence on renal dialysis: Secondary | ICD-10-CM | POA: Diagnosis not present

## 2018-08-16 ENCOUNTER — Ambulatory Visit (INDEPENDENT_AMBULATORY_CARE_PROVIDER_SITE_OTHER): Payer: Medicare HMO | Admitting: Vascular Surgery

## 2018-08-16 ENCOUNTER — Encounter (INDEPENDENT_AMBULATORY_CARE_PROVIDER_SITE_OTHER): Payer: Medicare HMO

## 2018-08-17 ENCOUNTER — Other Ambulatory Visit: Payer: Self-pay | Admitting: Physician Assistant

## 2018-08-17 ENCOUNTER — Other Ambulatory Visit: Payer: Self-pay | Admitting: Cardiovascular Disease

## 2018-08-17 DIAGNOSIS — N186 End stage renal disease: Secondary | ICD-10-CM | POA: Diagnosis not present

## 2018-08-17 DIAGNOSIS — Z992 Dependence on renal dialysis: Secondary | ICD-10-CM | POA: Diagnosis not present

## 2018-08-17 MED ORDER — TICAGRELOR 60 MG PO TABS: 60.0000 mg | ORAL_TABLET | Freq: Two times a day (BID) | ORAL | 6 refills | Status: DC

## 2018-08-18 DIAGNOSIS — Z992 Dependence on renal dialysis: Secondary | ICD-10-CM | POA: Diagnosis not present

## 2018-08-18 DIAGNOSIS — N186 End stage renal disease: Secondary | ICD-10-CM | POA: Diagnosis not present

## 2018-08-18 DIAGNOSIS — E8770 Fluid overload, unspecified: Secondary | ICD-10-CM | POA: Diagnosis not present

## 2018-08-20 ENCOUNTER — Ambulatory Visit (INDEPENDENT_AMBULATORY_CARE_PROVIDER_SITE_OTHER): Payer: Medicare HMO | Admitting: *Deleted

## 2018-08-20 ENCOUNTER — Telehealth: Payer: Self-pay | Admitting: Cardiovascular Disease

## 2018-08-20 VITALS — BP 161/74 | HR 85

## 2018-08-20 DIAGNOSIS — I10 Essential (primary) hypertension: Secondary | ICD-10-CM | POA: Diagnosis not present

## 2018-08-20 DIAGNOSIS — D631 Anemia in chronic kidney disease: Secondary | ICD-10-CM | POA: Diagnosis not present

## 2018-08-20 DIAGNOSIS — N186 End stage renal disease: Secondary | ICD-10-CM | POA: Diagnosis not present

## 2018-08-20 DIAGNOSIS — N2581 Secondary hyperparathyroidism of renal origin: Secondary | ICD-10-CM | POA: Diagnosis not present

## 2018-08-20 DIAGNOSIS — Z992 Dependence on renal dialysis: Secondary | ICD-10-CM | POA: Diagnosis not present

## 2018-08-20 NOTE — Progress Notes (Signed)
Patient walked in - AT DIALYSIS and was told to call here to report her BP and Heartrate  Heart rate 122  BP 131/72   See vitals check done in office.  No c/o SOB or dizziness.  Does c/o slight chest pain with exertion.  States that she takes an ASA or NTG and this goes away.  Feels like a tightness occasionally. Complain of BP being up & down at dialysis as well.  Does dialysis on M, W, F.

## 2018-08-20 NOTE — Progress Notes (Signed)
If HR remains elevated, obtain ECG. She may have sinus tachycardia due to volume removal/dialysis. BP looks good.

## 2018-08-20 NOTE — Telephone Encounter (Signed)
AT DIALYSIS and was told to call here to report her BP and Heartrate  Heart rate 122  BP 131/72

## 2018-08-20 NOTE — Telephone Encounter (Signed)
See nurse visit

## 2018-08-21 DIAGNOSIS — L509 Urticaria, unspecified: Secondary | ICD-10-CM | POA: Diagnosis not present

## 2018-08-21 DIAGNOSIS — Z6824 Body mass index (BMI) 24.0-24.9, adult: Secondary | ICD-10-CM | POA: Diagnosis not present

## 2018-08-22 DIAGNOSIS — N186 End stage renal disease: Secondary | ICD-10-CM | POA: Diagnosis not present

## 2018-08-22 DIAGNOSIS — Z992 Dependence on renal dialysis: Secondary | ICD-10-CM | POA: Diagnosis not present

## 2018-08-22 NOTE — Progress Notes (Signed)
Patient notified and verbalized understanding.   Patient c/o BP dropping at times as well during dialysis.  Informed patient that she should discuss with Dr. Lowanda Foster to make him aware so they can treat that acutely when pulling fluid off.  She verbalized understanding.

## 2018-08-24 DIAGNOSIS — Z992 Dependence on renal dialysis: Secondary | ICD-10-CM | POA: Diagnosis not present

## 2018-08-24 DIAGNOSIS — N186 End stage renal disease: Secondary | ICD-10-CM | POA: Diagnosis not present

## 2018-08-27 DIAGNOSIS — N186 End stage renal disease: Secondary | ICD-10-CM | POA: Diagnosis not present

## 2018-08-27 DIAGNOSIS — Z992 Dependence on renal dialysis: Secondary | ICD-10-CM | POA: Diagnosis not present

## 2018-08-28 ENCOUNTER — Other Ambulatory Visit: Payer: Self-pay | Admitting: Urology

## 2018-08-28 ENCOUNTER — Other Ambulatory Visit (HOSPITAL_COMMUNITY): Payer: Self-pay | Admitting: Urology

## 2018-08-28 DIAGNOSIS — Z992 Dependence on renal dialysis: Secondary | ICD-10-CM | POA: Diagnosis not present

## 2018-08-28 DIAGNOSIS — E1122 Type 2 diabetes mellitus with diabetic chronic kidney disease: Secondary | ICD-10-CM | POA: Diagnosis not present

## 2018-08-28 DIAGNOSIS — I120 Hypertensive chronic kidney disease with stage 5 chronic kidney disease or end stage renal disease: Secondary | ICD-10-CM | POA: Diagnosis not present

## 2018-08-28 DIAGNOSIS — K219 Gastro-esophageal reflux disease without esophagitis: Secondary | ICD-10-CM | POA: Diagnosis not present

## 2018-08-28 DIAGNOSIS — Z8249 Family history of ischemic heart disease and other diseases of the circulatory system: Secondary | ICD-10-CM | POA: Diagnosis not present

## 2018-08-28 DIAGNOSIS — Z8639 Personal history of other endocrine, nutritional and metabolic disease: Secondary | ICD-10-CM | POA: Diagnosis not present

## 2018-08-28 DIAGNOSIS — M1990 Unspecified osteoarthritis, unspecified site: Secondary | ICD-10-CM | POA: Diagnosis not present

## 2018-08-28 DIAGNOSIS — Z8543 Personal history of malignant neoplasm of ovary: Secondary | ICD-10-CM | POA: Diagnosis not present

## 2018-08-28 DIAGNOSIS — N186 End stage renal disease: Secondary | ICD-10-CM | POA: Diagnosis not present

## 2018-08-28 DIAGNOSIS — R000 Tachycardia, unspecified: Secondary | ICD-10-CM | POA: Diagnosis not present

## 2018-08-28 DIAGNOSIS — D3001 Benign neoplasm of right kidney: Secondary | ICD-10-CM

## 2018-08-28 DIAGNOSIS — R002 Palpitations: Secondary | ICD-10-CM | POA: Diagnosis not present

## 2018-08-29 DIAGNOSIS — Z992 Dependence on renal dialysis: Secondary | ICD-10-CM | POA: Diagnosis not present

## 2018-08-29 DIAGNOSIS — N186 End stage renal disease: Secondary | ICD-10-CM | POA: Diagnosis not present

## 2018-08-31 DIAGNOSIS — N186 End stage renal disease: Secondary | ICD-10-CM | POA: Diagnosis not present

## 2018-08-31 DIAGNOSIS — Z992 Dependence on renal dialysis: Secondary | ICD-10-CM | POA: Diagnosis not present

## 2018-09-03 DIAGNOSIS — N186 End stage renal disease: Secondary | ICD-10-CM | POA: Diagnosis not present

## 2018-09-03 DIAGNOSIS — Z992 Dependence on renal dialysis: Secondary | ICD-10-CM | POA: Diagnosis not present

## 2018-09-05 DIAGNOSIS — N186 End stage renal disease: Secondary | ICD-10-CM | POA: Diagnosis not present

## 2018-09-05 DIAGNOSIS — Z992 Dependence on renal dialysis: Secondary | ICD-10-CM | POA: Diagnosis not present

## 2018-09-07 DIAGNOSIS — Z992 Dependence on renal dialysis: Secondary | ICD-10-CM | POA: Diagnosis not present

## 2018-09-07 DIAGNOSIS — N186 End stage renal disease: Secondary | ICD-10-CM | POA: Diagnosis not present

## 2018-09-08 DIAGNOSIS — N186 End stage renal disease: Secondary | ICD-10-CM | POA: Diagnosis not present

## 2018-09-08 DIAGNOSIS — Z992 Dependence on renal dialysis: Secondary | ICD-10-CM | POA: Diagnosis not present

## 2018-09-10 DIAGNOSIS — Z992 Dependence on renal dialysis: Secondary | ICD-10-CM | POA: Diagnosis not present

## 2018-09-10 DIAGNOSIS — N186 End stage renal disease: Secondary | ICD-10-CM | POA: Diagnosis not present

## 2018-09-12 DIAGNOSIS — N186 End stage renal disease: Secondary | ICD-10-CM | POA: Diagnosis not present

## 2018-09-12 DIAGNOSIS — Z992 Dependence on renal dialysis: Secondary | ICD-10-CM | POA: Diagnosis not present

## 2018-09-13 ENCOUNTER — Ambulatory Visit (HOSPITAL_COMMUNITY): Payer: Medicare HMO

## 2018-09-14 DIAGNOSIS — Z992 Dependence on renal dialysis: Secondary | ICD-10-CM | POA: Diagnosis not present

## 2018-09-14 DIAGNOSIS — N186 End stage renal disease: Secondary | ICD-10-CM | POA: Diagnosis not present

## 2018-09-15 DIAGNOSIS — K219 Gastro-esophageal reflux disease without esophagitis: Secondary | ICD-10-CM | POA: Diagnosis not present

## 2018-09-15 DIAGNOSIS — M546 Pain in thoracic spine: Secondary | ICD-10-CM | POA: Diagnosis not present

## 2018-09-15 DIAGNOSIS — Z992 Dependence on renal dialysis: Secondary | ICD-10-CM | POA: Diagnosis not present

## 2018-09-15 DIAGNOSIS — E1122 Type 2 diabetes mellitus with diabetic chronic kidney disease: Secondary | ICD-10-CM | POA: Diagnosis not present

## 2018-09-15 DIAGNOSIS — I10 Essential (primary) hypertension: Secondary | ICD-10-CM | POA: Diagnosis not present

## 2018-09-15 DIAGNOSIS — Z833 Family history of diabetes mellitus: Secondary | ICD-10-CM | POA: Diagnosis not present

## 2018-09-15 DIAGNOSIS — I120 Hypertensive chronic kidney disease with stage 5 chronic kidney disease or end stage renal disease: Secondary | ICD-10-CM | POA: Diagnosis not present

## 2018-09-15 DIAGNOSIS — Z8249 Family history of ischemic heart disease and other diseases of the circulatory system: Secondary | ICD-10-CM | POA: Diagnosis not present

## 2018-09-15 DIAGNOSIS — Z79899 Other long term (current) drug therapy: Secondary | ICD-10-CM | POA: Diagnosis not present

## 2018-09-15 DIAGNOSIS — N186 End stage renal disease: Secondary | ICD-10-CM | POA: Diagnosis not present

## 2018-09-17 DIAGNOSIS — Z992 Dependence on renal dialysis: Secondary | ICD-10-CM | POA: Diagnosis not present

## 2018-09-17 DIAGNOSIS — N186 End stage renal disease: Secondary | ICD-10-CM | POA: Diagnosis not present

## 2018-09-17 DIAGNOSIS — D631 Anemia in chronic kidney disease: Secondary | ICD-10-CM | POA: Diagnosis not present

## 2018-09-17 DIAGNOSIS — N2581 Secondary hyperparathyroidism of renal origin: Secondary | ICD-10-CM | POA: Diagnosis not present

## 2018-09-18 ENCOUNTER — Encounter (HOSPITAL_COMMUNITY): Payer: Self-pay

## 2018-09-18 ENCOUNTER — Ambulatory Visit (HOSPITAL_COMMUNITY)
Admission: RE | Admit: 2018-09-18 | Discharge: 2018-09-18 | Disposition: A | Discharge status: Home / Self Care | Payer: Medicare HMO | Source: Ambulatory Visit | Attending: Urology | Admitting: Urology

## 2018-09-18 DIAGNOSIS — D3001 Benign neoplasm of right kidney: Secondary | ICD-10-CM

## 2018-09-18 MED ORDER — IOHEXOL 300 MG/ML  SOLN: 100.0000 mL | Freq: Once | INTRAMUSCULAR | Status: DC | PRN

## 2018-09-19 DIAGNOSIS — N186 End stage renal disease: Secondary | ICD-10-CM | POA: Diagnosis not present

## 2018-09-19 DIAGNOSIS — Z992 Dependence on renal dialysis: Secondary | ICD-10-CM | POA: Diagnosis not present

## 2018-09-21 ENCOUNTER — Other Ambulatory Visit: Payer: Self-pay | Admitting: Cardiovascular Disease

## 2018-09-21 DIAGNOSIS — N186 End stage renal disease: Secondary | ICD-10-CM | POA: Diagnosis not present

## 2018-09-21 DIAGNOSIS — Z992 Dependence on renal dialysis: Secondary | ICD-10-CM | POA: Diagnosis not present

## 2018-09-24 DIAGNOSIS — N186 End stage renal disease: Secondary | ICD-10-CM | POA: Diagnosis not present

## 2018-09-24 DIAGNOSIS — Z992 Dependence on renal dialysis: Secondary | ICD-10-CM | POA: Diagnosis not present

## 2018-09-26 DIAGNOSIS — N186 End stage renal disease: Secondary | ICD-10-CM | POA: Diagnosis not present

## 2018-09-26 DIAGNOSIS — Z992 Dependence on renal dialysis: Secondary | ICD-10-CM | POA: Diagnosis not present

## 2018-09-27 ENCOUNTER — Telehealth: Payer: Self-pay | Admitting: Cardiovascular Disease

## 2018-09-27 NOTE — Telephone Encounter (Signed)
Patient called stating that [facility] told her that they have not received RX for isosorbide mononitrate (IMDUR) 120 MG .

## 2018-09-28 DIAGNOSIS — Z992 Dependence on renal dialysis: Secondary | ICD-10-CM | POA: Diagnosis not present

## 2018-09-28 DIAGNOSIS — N186 End stage renal disease: Secondary | ICD-10-CM | POA: Diagnosis not present

## 2018-09-28 NOTE — Telephone Encounter (Signed)
   Primary Cardiologist:  Kate Sable, MD   Patient contacted.  History reviewed.  No symptoms to suggest any unstable cardiac conditions.  Based on discussion, with current pandemic situation, we will be postponing this appointment till 10/22/2018.  If symptoms change, she has been instructed to contact our office.     Shawna [name]

## 2018-09-28 NOTE — Telephone Encounter (Signed)
Home number not in service.    Detailed voice message left on voicemail - per [place], her Imdur is there waiting on her to pick up.

## 2018-09-28 NOTE — Telephone Encounter (Signed)
Patient notified verbalized understanding

## 2018-10-01 DIAGNOSIS — N186 End stage renal disease: Secondary | ICD-10-CM | POA: Diagnosis not present

## 2018-10-01 DIAGNOSIS — N2581 Secondary hyperparathyroidism of renal origin: Secondary | ICD-10-CM | POA: Diagnosis not present

## 2018-10-01 DIAGNOSIS — Z992 Dependence on renal dialysis: Secondary | ICD-10-CM | POA: Diagnosis not present

## 2018-10-01 DIAGNOSIS — D509 Iron deficiency anemia, unspecified: Secondary | ICD-10-CM | POA: Diagnosis not present

## 2018-10-02 ENCOUNTER — Ambulatory Visit (HOSPITAL_COMMUNITY): Admission: RE | Admit: 2018-10-02 | Payer: Medicare HMO | Source: Ambulatory Visit

## 2018-10-02 DIAGNOSIS — D631 Anemia in chronic kidney disease: Secondary | ICD-10-CM | POA: Diagnosis not present

## 2018-10-02 DIAGNOSIS — E875 Hyperkalemia: Secondary | ICD-10-CM | POA: Diagnosis not present

## 2018-10-02 DIAGNOSIS — E785 Hyperlipidemia, unspecified: Secondary | ICD-10-CM | POA: Diagnosis not present

## 2018-10-02 DIAGNOSIS — Z955 Presence of coronary angioplasty implant and graft: Secondary | ICD-10-CM | POA: Diagnosis not present

## 2018-10-02 DIAGNOSIS — R072 Precordial pain: Secondary | ICD-10-CM | POA: Diagnosis not present

## 2018-10-02 DIAGNOSIS — R079 Chest pain, unspecified: Secondary | ICD-10-CM | POA: Diagnosis not present

## 2018-10-02 DIAGNOSIS — M1990 Unspecified osteoarthritis, unspecified site: Secondary | ICD-10-CM | POA: Diagnosis not present

## 2018-10-02 DIAGNOSIS — I25119 Atherosclerotic heart disease of native coronary artery with unspecified angina pectoris: Secondary | ICD-10-CM | POA: Diagnosis not present

## 2018-10-02 DIAGNOSIS — I10 Essential (primary) hypertension: Secondary | ICD-10-CM | POA: Diagnosis not present

## 2018-10-02 DIAGNOSIS — I120 Hypertensive chronic kidney disease with stage 5 chronic kidney disease or end stage renal disease: Secondary | ICD-10-CM | POA: Diagnosis not present

## 2018-10-02 DIAGNOSIS — Z951 Presence of aortocoronary bypass graft: Secondary | ICD-10-CM | POA: Diagnosis not present

## 2018-10-02 DIAGNOSIS — K219 Gastro-esophageal reflux disease without esophagitis: Secondary | ICD-10-CM | POA: Diagnosis not present

## 2018-10-02 DIAGNOSIS — Z992 Dependence on renal dialysis: Secondary | ICD-10-CM | POA: Diagnosis not present

## 2018-10-02 DIAGNOSIS — N186 End stage renal disease: Secondary | ICD-10-CM | POA: Diagnosis not present

## 2018-10-02 DIAGNOSIS — R0789 Other chest pain: Secondary | ICD-10-CM | POA: Diagnosis not present

## 2018-10-03 DIAGNOSIS — R079 Chest pain, unspecified: Secondary | ICD-10-CM | POA: Diagnosis not present

## 2018-10-03 DIAGNOSIS — Z992 Dependence on renal dialysis: Secondary | ICD-10-CM | POA: Diagnosis not present

## 2018-10-03 DIAGNOSIS — I2510 Atherosclerotic heart disease of native coronary artery without angina pectoris: Secondary | ICD-10-CM | POA: Diagnosis not present

## 2018-10-03 DIAGNOSIS — K219 Gastro-esophageal reflux disease without esophagitis: Secondary | ICD-10-CM | POA: Diagnosis not present

## 2018-10-03 DIAGNOSIS — I10 Essential (primary) hypertension: Secondary | ICD-10-CM | POA: Diagnosis not present

## 2018-10-03 DIAGNOSIS — N186 End stage renal disease: Secondary | ICD-10-CM | POA: Diagnosis not present

## 2018-10-05 ENCOUNTER — Ambulatory Visit: Payer: Medicare HMO | Admitting: Cardiovascular Disease

## 2018-10-05 DIAGNOSIS — N186 End stage renal disease: Secondary | ICD-10-CM | POA: Diagnosis not present

## 2018-10-05 DIAGNOSIS — Z992 Dependence on renal dialysis: Secondary | ICD-10-CM | POA: Diagnosis not present

## 2018-10-08 ENCOUNTER — Other Ambulatory Visit: Payer: Self-pay | Admitting: Cardiovascular Disease

## 2018-10-08 DIAGNOSIS — Z992 Dependence on renal dialysis: Secondary | ICD-10-CM | POA: Diagnosis not present

## 2018-10-08 DIAGNOSIS — N186 End stage renal disease: Secondary | ICD-10-CM | POA: Diagnosis not present

## 2018-10-10 DIAGNOSIS — N186 End stage renal disease: Secondary | ICD-10-CM | POA: Diagnosis not present

## 2018-10-10 DIAGNOSIS — Z992 Dependence on renal dialysis: Secondary | ICD-10-CM | POA: Diagnosis not present

## 2018-10-12 DIAGNOSIS — Z992 Dependence on renal dialysis: Secondary | ICD-10-CM | POA: Diagnosis not present

## 2018-10-12 DIAGNOSIS — N186 End stage renal disease: Secondary | ICD-10-CM | POA: Diagnosis not present

## 2018-10-15 DIAGNOSIS — N186 End stage renal disease: Secondary | ICD-10-CM | POA: Diagnosis not present

## 2018-10-15 DIAGNOSIS — Z992 Dependence on renal dialysis: Secondary | ICD-10-CM | POA: Diagnosis not present

## 2018-10-16 DIAGNOSIS — N19 Unspecified kidney failure: Secondary | ICD-10-CM | POA: Diagnosis not present

## 2018-10-16 DIAGNOSIS — I208 Other forms of angina pectoris: Secondary | ICD-10-CM | POA: Diagnosis not present

## 2018-10-16 DIAGNOSIS — G4700 Insomnia, unspecified: Secondary | ICD-10-CM | POA: Diagnosis not present

## 2018-10-16 DIAGNOSIS — L509 Urticaria, unspecified: Secondary | ICD-10-CM | POA: Diagnosis not present

## 2018-10-16 DIAGNOSIS — I10 Essential (primary) hypertension: Secondary | ICD-10-CM | POA: Diagnosis not present

## 2018-10-17 DIAGNOSIS — Z992 Dependence on renal dialysis: Secondary | ICD-10-CM | POA: Diagnosis not present

## 2018-10-17 DIAGNOSIS — N186 End stage renal disease: Secondary | ICD-10-CM | POA: Diagnosis not present

## 2018-10-18 ENCOUNTER — Telehealth: Payer: Self-pay | Admitting: *Deleted

## 2018-10-18 ENCOUNTER — Ambulatory Visit (HOSPITAL_COMMUNITY): Admission: RE | Admit: 2018-10-18 | Payer: Medicare HMO | Source: Ambulatory Visit

## 2018-10-18 NOTE — Telephone Encounter (Signed)
Medications and allergies reviewed with patient  Aware to check BP, HR and weight prior to visit-will be done while at dialysis Recent over night hospitalization at [facility] for angina-records requested    Today, the patient verbally consented for telehealth visits with [facility] and understands that her insurance company will be billed for the encounter.

## 2018-10-19 DIAGNOSIS — N186 End stage renal disease: Secondary | ICD-10-CM | POA: Diagnosis not present

## 2018-10-19 DIAGNOSIS — Z992 Dependence on renal dialysis: Secondary | ICD-10-CM | POA: Diagnosis not present

## 2018-10-22 ENCOUNTER — Encounter: Payer: Self-pay | Admitting: Cardiology

## 2018-10-22 ENCOUNTER — Telehealth: Payer: Medicare HMO | Admitting: Cardiology

## 2018-10-22 ENCOUNTER — Telehealth (INDEPENDENT_AMBULATORY_CARE_PROVIDER_SITE_OTHER): Payer: Medicare HMO | Admitting: Cardiology

## 2018-10-22 VITALS — Ht 61.0 in

## 2018-10-22 DIAGNOSIS — E785 Hyperlipidemia, unspecified: Secondary | ICD-10-CM | POA: Diagnosis not present

## 2018-10-22 DIAGNOSIS — N186 End stage renal disease: Secondary | ICD-10-CM

## 2018-10-22 DIAGNOSIS — I132 Hypertensive heart and chronic kidney disease with heart failure and with stage 5 chronic kidney disease, or end stage renal disease: Secondary | ICD-10-CM | POA: Diagnosis not present

## 2018-10-22 DIAGNOSIS — D631 Anemia in chronic kidney disease: Secondary | ICD-10-CM | POA: Diagnosis not present

## 2018-10-22 DIAGNOSIS — I25708 Atherosclerosis of coronary artery bypass graft(s), unspecified, with other forms of angina pectoris: Secondary | ICD-10-CM | POA: Diagnosis not present

## 2018-10-22 DIAGNOSIS — Z79899 Other long term (current) drug therapy: Secondary | ICD-10-CM

## 2018-10-22 DIAGNOSIS — Z992 Dependence on renal dialysis: Secondary | ICD-10-CM

## 2018-10-22 DIAGNOSIS — N2581 Secondary hyperparathyroidism of renal origin: Secondary | ICD-10-CM | POA: Diagnosis not present

## 2018-10-22 DIAGNOSIS — Z7189 Other specified counseling: Secondary | ICD-10-CM | POA: Diagnosis not present

## 2018-10-22 DIAGNOSIS — I5032 Chronic diastolic (congestive) heart failure: Secondary | ICD-10-CM

## 2018-10-22 DIAGNOSIS — I480 Paroxysmal atrial fibrillation: Secondary | ICD-10-CM | POA: Diagnosis not present

## 2018-10-22 NOTE — Progress Notes (Signed)
Virtual Visit via Telephone Note   This visit type was conducted due to national recommendations for restrictions regarding the COVID-19 Pandemic (e.g. social distancing) in an effort to limit this patient's exposure and mitigate transmission in our community.  Due to her co-morbid illnesses, this patient is at least at moderate risk for complications without adequate follow up.  This format is felt to be most appropriate for this patient at this time.  The patient did not have access to video technology/had technical difficulties with video requiring transitioning to audio format only (telephone).  All issues noted in this document were discussed and addressed.  No physical exam could be performed with this format.  Please refer to the patient's chart for her  consent to telehealth for [facility].   Evaluation Performed:  Follow-up visit  Date:  10/22/2018   ID:  Shawna [name], Shawna [name] 06-03-1941, MRN [id number]  Patient Location: Home  Provider Location: Home  PCP:  [name], MD  Cardiologist:  Kate Sable, MD  Electrophysiologist:  None   Chief Complaint:  CAD, Hypertension  History of Present Illness:    Shawna [name] is a 78 y.o. female who presents via audio/video conferencing for a telehealth visit today.  The patient was contacted by phone as she does not have a smart phone or computer with video capability. She is using a flip phone.   The patient does not have symptoms concerning for COVID-19 infection (fever, chills, cough, or new shortness of breath).   Shawna [name] has a past medical history significant for ESRD on HD M,W,F, HTN, HLD, PAF/NSVT on amiodarone and CAD s/p CABG 2013, NSTEMI with DES to D1 and patent grafts 10/2016. She is not on anticoagulation due to history of GI bleed. She had non-ischemic NST in 02/2018. She was admitted to the hospital in 06/2018 for chest pain. She was severely hypertensive and had flat, mildly elevated enzymes. Cardiac cath on  07/13/2018 showed occlusion of the diagonal, progression of disease in the PDA, unclear if this was the cause of her chest pain. She also had mildly elevated EDP. Plan was to monitor and if refractory angina despite good BP control and volume management would consider PCI of the PDA.   At her last visit with Ermalinda Barrios on 08/07/2018 at which time the patient was reporting drops in her BP with dialysis and BP elevated at night. She had been afraid to take her BP meds. She reported that she was dizzy when her BP was 117. She also had some angina that was relieved with NTG. It was noted that the patient was previously taken of amiodarone by Dr. Bronson Ing due to c/o hair loss and weight loss which then improved, however, it was resumed in the hospital.   Today Shawna [name] is bright and cheerful. She says she is doing well. She says that her BP drops with dialysis to ~120.  She says that she passed out once when her BP was 120. Her BP runs 140's-180. It drops to 130-140 when fluid is being drawn off. She is not taking hydralazine as she says it drops her BP too much and she feels funny. Yesterday her BP was 119 and she felt a little funny. She thinks this is low and she feels like she might pass out.   She has occ chest pressure. She took a NTG this am. She takes NTG about twice a week. She was seen at the hospital at [facility] in  [place] and was kept overnight about 2 weeks ago for chest pain and was told that it was angina. Her chest pressure occurs with activity like putting on her stockings. It does not bother her when walking around the house. She denies shortness of breath, edema, orthopnea, lightheadedness or syncope.   She has been getting to her dry weight with dialysis.   Past Medical History:  Diagnosis Date  . Acute on chronic respiratory failure with hypoxia ([place]) 10/10/2016  . Anxiety   . Arthritis   . AVM (arteriovenous malformation) of colon   . CAD (coronary artery disease)    a. s/p  CABG in 2013 b. most recent DES to D1 in 10/2016 with cath showing patent LIMA-LAD, SVG-RI, SVG-Mrg and SVG-RCA  c. NST in 02/2018 showing scar with no significant ischemia  . Carotid artery disease ([place])    a. 32-99% LICA, 08/4266   . Chronic bronchitis ([place])   . Chronic diastolic CHF (congestive heart failure) ([place])    a. 02/2012 Echo EF 60-65%, nl wall motion, Gr 1 DD, mod MR  . Colon cancer ([place]) 1992  . Esophageal stricture   . ESRD on hemodialysis ([place])    ESRD due to HTN, started dialysis 2011 and gets HD at [facility] with Dr Hinda Lenis on MWF schedule.  Access is LUA AVF as of Sept 2014.   Marland Kitchen GERD (gastroesophageal reflux disease)   . High cholesterol 12/2011  . History of blood transfusion 07/2011; 12/2011; 01/2012 X 2; 04/2012  . History of gout   . History of lower GI bleeding   . Hypertension   . Iron deficiency anemia   . Mitral regurgitation    a. Moderate by echo, 02/2012  . Myocardial infarction ([place])   . Ovarian cancer ([place]) 1992  . Pneumonia ~ 2009  . PUD (peptic ulcer disease)   . TIA (transient ischemic attack)    Past Surgical History:  Procedure Laterality Date  . ABDOMINAL HYSTERECTOMY  1992  . APPENDECTOMY  06/1990  . AV FISTULA PLACEMENT  07/2009   left upper arm  . AV FISTULA PLACEMENT Right 09/06/2016   Procedure: RIGHT FOREARM ARTERIOVENOUS (AV) GRAFT;  Surgeon: Elam Dutch, MD;  Location: [facility] OR;  Service: Vascular;  Laterality: Right;  . AV FISTULA PLACEMENT N/A 02/24/2017   Procedure: INSERTION OF ARTERIOVENOUS (AV) GORE-TEX GRAFT ARM (BRACHIAL AXILLARY);  Surgeon: Katha Cabal, MD;  Location: [facility] ORS;  Service: Vascular;  Laterality: N/A;  . [place] Right 09/06/2016   Procedure: REMOVAL OF Right Arm ARTERIOVENOUS GORETEX GRAFT and Vein Patch angioplasty of brachial artery;  Surgeon: Angelia Mould, MD;  Location: [place];  Service: Vascular;  Laterality: Right;  . COLON RESECTION  1992  . COLON SURGERY    . CORONARY ANGIOPLASTY WITH  STENT PLACEMENT  12/15/11   "2"  . CORONARY ANGIOPLASTY WITH STENT PLACEMENT  y/2013   "1; makes total of 3" (05/02/2012)  . CORONARY ARTERY BYPASS GRAFT  06/13/2012   Procedure: CORONARY ARTERY BYPASS GRAFTING (CABG);  Surgeon: Grace Isaac, MD;  Location: [place];  Service: Open Heart Surgery;  Laterality: N/A;  cabg x four;  using left internal mammary artery, and left leg greater saphenous vein harvested endoscopically  . CORONARY STENT INTERVENTION N/A 10/13/2016   Procedure: Coronary Stent Intervention;  Surgeon: Troy Sine, MD;  Location: [place] CV LAB;  Service: Cardiovascular;  Laterality: N/A;  . DIALYSIS/PERMA CATHETER REMOVAL N/A 04/18/2017   Procedure: DIALYSIS/PERMA CATHETER REMOVAL;  Surgeon: Katha Cabal, MD;  Location: [place] CV LAB;  Service: Cardiovascular;  Laterality: N/A;  . DILATION AND CURETTAGE OF UTERUS    . ESOPHAGOGASTRODUODENOSCOPY  01/20/2012   Procedure: ESOPHAGOGASTRODUODENOSCOPY (EGD);  Surgeon: Ladene Artist, MD,FACG;  Location: [facility] ENDOSCOPY;  Service: Endoscopy;  Laterality: N/A;  . ESOPHAGOGASTRODUODENOSCOPY N/A 03/26/2013   Procedure: ESOPHAGOGASTRODUODENOSCOPY (EGD);  Surgeon: Irene Shipper, MD;  Location: [facility] ENDOSCOPY;  Service: Endoscopy;  Laterality: N/A;  . ESOPHAGOGASTRODUODENOSCOPY N/A 04/30/2015   Procedure: ESOPHAGOGASTRODUODENOSCOPY (EGD);  Surgeon: Rogene Houston, MD;  [facility];  Service: Endoscopy;  Laterality: N/A;  1pm - moved to 10/20 @ 1:10  . ESOPHAGOGASTRODUODENOSCOPY N/A 07/29/2016   Procedure: ESOPHAGOGASTRODUODENOSCOPY (EGD);  Surgeon: Manus Gunning, MD;  Location: [place];  Service: Gastroenterology;  Laterality: N/A;  enteroscopy  . INTRAOPERATIVE TRANSESOPHAGEAL ECHOCARDIOGRAM  06/13/2012   Procedure: INTRAOPERATIVE TRANSESOPHAGEAL ECHOCARDIOGRAM;  Surgeon: Grace Isaac, MD;  Location: [place];  Service: Open Heart Surgery;  Laterality: N/A;  . IR GENERIC HISTORICAL  07/26/2016   IR  FLUORO GUIDE CV LINE RIGHT 07/26/2016 Greggory Keen, MD [facility]-INTER[facility] RAD  . IR GENERIC HISTORICAL  07/26/2016   IR US GUIDE VASC ACCESS RIGHT 07/26/2016 Greggory Keen, MD [facility]  . IR GENERIC HISTORICAL  08/02/2016   IR US GUIDE VASC ACCESS RIGHT 08/02/2016 Greggory Keen, MD [facility]  . IR GENERIC HISTORICAL  08/02/2016   IR FLUORO GUIDE CV LINE RIGHT 08/02/2016 Greggory Keen, MD [facility]  . IR RADIOLOGY PERIPHERAL GUIDED IV START  03/28/2017  . IR US GUIDE VASC ACCESS RIGHT  03/28/2017  . LEFT HEART CATH AND CORONARY ANGIOGRAPHY N/A 09/20/2016   Procedure: Left Heart Cath and Coronary Angiography;  Surgeon: Shawna Crome, MD;  Location: [place] CV LAB;  Service: Cardiovascular;  Laterality: N/A;  . LEFT HEART CATH AND CORS/GRAFTS ANGIOGRAPHY N/A 10/13/2016   Procedure: Left Heart Cath and Cors/Grafts Angiography;  Surgeon: Troy Sine, MD;  Location: [place] CV LAB;  Service: Cardiovascular;  Laterality: N/A;  . LEFT HEART CATH AND CORS/GRAFTS ANGIOGRAPHY N/A 07/13/2018   Procedure: LEFT HEART CATH AND CORS/GRAFTS ANGIOGRAPHY;  Surgeon: Martinique, Peter M, MD;  Location: [place] CV LAB;  Service: Cardiovascular;  Laterality: N/A;  . LEFT HEART CATHETERIZATION WITH CORONARY ANGIOGRAM N/A 12/15/2011   Procedure: LEFT HEART CATHETERIZATION WITH CORONARY ANGIOGRAM;  Surgeon: Burnell Blanks, MD;  Location: [facility] CATH LAB;  Service: Cardiovascular;  Laterality: N/A;  . LEFT HEART CATHETERIZATION WITH CORONARY ANGIOGRAM N/A 01/10/2012   Procedure: LEFT HEART CATHETERIZATION WITH CORONARY ANGIOGRAM;  Surgeon: Peter M Martinique, MD;  Location: [facility] CATH LAB;  Service: Cardiovascular;  Laterality: N/A;  . LEFT HEART CATHETERIZATION WITH CORONARY ANGIOGRAM N/A 06/08/2012   Procedure: LEFT HEART CATHETERIZATION WITH CORONARY ANGIOGRAM;  Surgeon: Burnell Blanks, MD;  Location: [facility] CATH LAB;  Service: Cardiovascular;  Laterality: N/A;  . LEFT HEART CATHETERIZATION WITH CORONARY/GRAFT ANGIOGRAM  N/A 12/10/2013   Procedure: LEFT HEART CATHETERIZATION WITH Beatrix Fetters;  Surgeon: [name], MD;  Location: [facility] CATH LAB;  Service: Cardiovascular;  Laterality: N/A;  . OVARY SURGERY     ovarian cancer  . REVISION OF ARTERIOVENOUS GORETEX GRAFT N/A 02/24/2017   Procedure: REVISION OF ARTERIOVENOUS GORETEX GRAFT (RESECTION);  Surgeon: Katha Cabal, MD;  Location: [facility] ORS;  Service: Vascular;  Laterality: N/A;  [place] N/A 10/15/2013   Procedure: Fistulogram;  Surgeon: Serafina Mitchell, MD;  Location: [facility] CATH LAB;  Service: Cardiovascular;  Laterality: N/A;  . THROMBECTOMY / ARTERIOVENOUS GRAFT REVISION  2011   left upper arm  . TUBAL LIGATION  1980's  . UPPER EXTREMITY ANGIOGRAPHY Bilateral 12/06/2016   Procedure: Upper Extremity Angiography;  Surgeon: Katha Cabal, MD;  Location: [place] CV LAB;  Service: Cardiovascular;  Laterality: Bilateral;  . UPPER EXTREMITY INTERVENTION Left 06/06/2017   Procedure: UPPER EXTREMITY INTERVENTION;  Surgeon: Katha Cabal, MD;  Location: [place] CV LAB;  Service: Cardiovascular;  Laterality: Left;     Current Meds  Medication Sig  . albuterol (PROVENTIL) (2.5 MG/3ML) 0.083% nebulizer solution Inhale 3 mLs into the lungs every 4 (four) hours as needed.  . ALPRAZolam (XANAX) 0.5 MG tablet Take 0.5 mg by mouth daily.  Marland Kitchen amiodarone (PACERONE) 200 MG tablet Take 1 tablet (200 mg total) by mouth daily.  . bisacodyl (DULCOLAX) 10 MG suppository Place 1 suppository (10 mg total) rectally as needed for moderate constipation.  . carvedilol (COREG) 6.25 MG tablet Take 6.25 mg by mouth 2 (two) times daily with a meal.  . cinacalcet (SENSIPAR) 30 MG tablet Take 30 mg by mouth daily after supper.  . cloNIDine (CATAPRES) 0.2 MG tablet Take 0.1-0.2 mg by mouth daily as needed (high blood pressure).   . diphenhydrAMINE (BENADRYL) 25 mg capsule Take 50 mg by mouth daily as needed for itching or allergies.   .  diphenoxylate-atropine (LOMOTIL) 2.5-0.025 MG tablet Take 1 tablet by mouth 4 (four) times daily as needed for diarrhea or loose stools.  Marland Kitchen epoetin alfa (EPOGEN,PROCRIT) 81275 UNIT/ML injection Inject 1 mL (10,000 Units total) into the vein every Monday, Wednesday, and Friday with hemodialysis.  Marland Kitchen escitalopram (LEXAPRO) 10 MG tablet Take 1 tablet (10 mg total) by mouth daily.  . fluticasone (FLONASE) 50 MCG/ACT nasal spray Place 1 spray at bedtime as needed into both nostrils for allergies.   . hydrALAZINE (APRESOLINE) 50 MG tablet Take 75 mg by mouth 2 (two) times daily as needed.   . isosorbide mononitrate (IMDUR) 120 MG 24 hr tablet Take 1 tablet by mouth once daily  . lidocaine-prilocaine (EMLA) cream Apply 1 application topically every Monday, Wednesday, and Friday. Prior to dialysis  . loratadine (CLARITIN) 10 MG tablet Take 1 tablet (10 mg total) by mouth daily as needed for allergies.  . multivitamin (RENA-VIT) TABS tablet Take 1 tablet by mouth daily.  . nitroGLYCERIN (NITROSTAT) 0.4 MG SL tablet DISSOLVE ONE TABLET UNDER THE TONGUE EVERY 5 MINUTES AS NEEDED FOR CHEST PAIN.  DO NOT EXCEED A TOTAL OF 3 DOSES IN 15 MINUTES  . nystatin-triamcinolone (MYCOLOG II) cream Apply to the affected area twice a day  . omeprazole (PRILOSEC) 20 MG capsule Take 1 capsule (20 mg total) by mouth daily.  . ondansetron (ZOFRAN-ODT) 4 MG disintegrating tablet Take 4 mg by mouth every 8 (eight) hours as needed for nausea or vomiting.   . sevelamer carbonate (RENVELA) 800 MG tablet Take 800-2,400 mg by mouth See admin instructions. Take 3 tablets (2400) by mouth twice daily with meals, take 1 tablet (800 mg) with snacks  . simvastatin (ZOCOR) 20 MG tablet TAKE ONE TABLET (20 MG) BY MOUTH DAILY AT BEDTIME  . ticagrelor (BRILINTA) 60 MG TABS tablet Take 1 tablet (60 mg total) by mouth 2 (two) times daily.     Allergies:   Aspirin; Penicillins; Amlodipine; Bactrim [sulfamethoxazole-trimethoprim]; Contrast media  [iodinated diagnostic agents]; Iron; Nitrofurantoin; Tylenol [acetaminophen]; Gabapentin; Dexilant [dexlansoprazole]; Levaquin [levofloxacin in d5w]; Morphine and related; Plavix [clopidogrel bisulfate]; Protonix [pantoprazole  sodium]; and Venofer [ferric oxide]   Social History   Tobacco Use  . Smoking status: Never Smoker  . Smokeless tobacco: Never Used  Substance Use Topics  . Alcohol use: No    Alcohol/week: 0.0 standard drinks  . Drug use: No     Family Hx: The patient's family history includes Diabetes in her brother, father, mother, and sister; Heart attack in her brother, brother, and mother; Heart disease in her father and mother; Hyperlipidemia in her brother, father, and mother; Hypertension in her father, mother, sister, and sister; Other in an other family member. There is no history of Colon cancer, Esophageal cancer, Liver disease, Kidney disease, or Colon polyps.  ROS:   Please see the history of present illness.     All other systems reviewed and are negative.   Prior CV studies:   The following studies were reviewed today:  Cardiac catheterization 07/13/2018 1. Severe 3 vessel occlusive CAD    - 60% distal left main    - 90% mid LAD    - 100% first diagonal at prior stent site    - 99% proximal LCx at prior stent site    - 80% ramus intermediate.    - 100% ostial RCA at prior stent site    - 75% proximal PDA 2. Patent LIMA to the LAD 3. Patent SVG to ramus intermediate 4. Patent SVG to OM 5. Patent SVG to the distal RCA. 6. Good LV function 7. Mildly elevated LVEDP  Plan: Compared to prior cath in April 2018 the diagonal is now occluded. There is progression of disease in the PDA. It is unclear if this is the reason for her chest pain. She is severely hypertensive and EDP is mildly elevated. I would recommend medical management with good BP control. If she has refractory angina despite good BP and volume management we could consider PCI of the PDA but this  would have to be done through the SVG and in general she has not responded well to stenting in the past with all prior stents occluded. I will increase her Coreg dose. She will need dialysis in am.    Labs/Other Tests and Data Reviewed:    EKG:  An ECG dated 09/18/2018 was personally reviewed today and demonstrated:  Normal sinus rhythm at 72 bpm, with Left ventricular hypertrophy with QRS widening and repolarization abnormality Prolonged QT, QTC 543  Recent Labs: 07/14/2018: BUN 73; Creatinine, Ser 9.94; Hemoglobin 10.6; Platelets 159; Potassium 4.9; Sodium 135   Recent Lipid Panel Lab Results  Component Value Date/Time   CHOL 93 10/16/2017 05:39 AM   TRIG 56 10/16/2017 05:39 AM   HDL 56 10/16/2017 05:39 AM   CHOLHDL 1.7 10/16/2017 05:39 AM   LDLCALC 26 10/16/2017 05:39 AM    Wt Readings from Last 3 Encounters:  08/07/18 127 lb (57.6 kg)  07/14/18 127 lb 3.3 oz (57.7 kg)  05/10/18 121 lb (54.9 kg)     Objective:    Vital Signs:  Ht 5\' 1"  (1.549 m)   BMI 24.00 kg/m    Pt breathing sounds normal over the phone, she is able to speak in complete sentences.   ASSESSMENT & PLAN:    CAD with stable angina -S/P CABG 2013, NSTEMI with DES to diagonal 10/2016. Cardiac cath in 07/2018 showed occluded diagonal and progressive disease of the PDA, 75%. Grafts were patent and normal LV function.  -Per cath notes: recommendation for medical management with good BP control. If she has refractory  angina despite good BP and volume management we could consider PCI of the PDA but this would have to be done through the SVG and in general she has not responded well to stenting in the past with all prior stents occluded.  -On BB and Imdur, statin, Ticagrelor and prn NTG. She is not taking hydralazine as she has perceived low BP and feels like she may pass out if it gets below 120.  -Pt having chest pressure about twice a week with putting on her stockings, not with walking. Continue current therapy  with PRN NTG. Plan for close follow up  With Dr. Bronson Ing in 3 months when hopefully the COVID restrictions may be lifted.  -Pt is paying $100 per month for Brilinta and states this is hard to pay for. She would like for me to check with Dr. Burke Keels to see if she can take a less costly medication.   Essential Hypertension -On carvedilol 6.25 mg, clonidine 0.1-0.2 mg prn (rarely takes), hydralazine 75 mg BID (pt not taking), Imdur 120 mg daily. Pt not taking hydralazine due to perceived low BP although it only gets down to about 120. She feels that she is about to pass out fi it gets below 120. -I tried to discuss that 120 and even down to 100 for SBP is normal but she is insistent that she will pass out if BP gets below 120.  -Pt advised that can hold am dose of carvedilol on dialysis days. We also  discussed that she can take Imdur in the evenings so that it is not hitting her right when she is starting dialysis. She agrees to try this.  -Will have her f/u closely with Dr. Bronson Ing in 3 months.   Hyperlipidemia -On simvastatin  -LDL was 26 in 10/2017. Continue current therapy.  History of PAF/NSVT -On amiodarone. This had been stopped in the 06/2018 due to c/o hair loss and wt loss, however was resumed in the hospital in 07/2018.  -Tolerating it OK but feels like her hair is still very thin, "almost bald". She is having no palpitations.  -Can discuss with Dr. Bronson Ing at next appointment.  -Not on anticoagulation due to prior GI bleed.   ESRD -On HD M,W,F.   Chronic diastolic CHF -Volume management per nephrology. Seems to be euvolemic.   COVID-19 Education: The signs and symptoms of COVID-19 were discussed with the patient and how to seek care for testing (follow up with PCP or arrange E-visit).  The importance of social distancing was discussed today.  Time:   Today, I have spent 22 minutes with the patient with telehealth technology discussing the above problems.      Medication Adjustments/Labs and Tests Ordered: Current medicines are reviewed at length with the patient today.  Concerns regarding medicines are outlined above.  Tests Ordered: No orders of the defined types were placed in this encounter.   Medication Changes: No orders of the defined types were placed in this encounter.   Disposition:  Follow up in 3 month(s)  Signed, Daune Perch, NP  10/22/2018 4:32 PM    [facility]

## 2018-10-22 NOTE — Patient Instructions (Signed)
Medication Instructions:   Your physician recommends that you continue on your current medications as directed. Please refer to the Current Medication list given to you today.  Labwork:  NONE  Testing/Procedures:  NONE  Follow-Up:  Your physician recommends that you schedule a follow-up appointment in: 3 months with Dr. Bronson Ing.  Any Other Special Instructions Will Be Listed Below (If Applicable).  If you need a refill on your cardiac medications before your next appointment, please call your pharmacy.

## 2018-10-23 ENCOUNTER — Ambulatory Visit (HOSPITAL_COMMUNITY): Payer: Medicare HMO

## 2018-10-23 NOTE — Progress Notes (Signed)
This is what was recommended by interventional cardiology. She developed a rash with Plavix. Can stop amiodarone to see if this helps.

## 2018-10-24 DIAGNOSIS — Z992 Dependence on renal dialysis: Secondary | ICD-10-CM | POA: Diagnosis not present

## 2018-10-24 DIAGNOSIS — N186 End stage renal disease: Secondary | ICD-10-CM | POA: Diagnosis not present

## 2018-10-25 ENCOUNTER — Telehealth: Payer: Self-pay | Admitting: Cardiology

## 2018-10-25 NOTE — Telephone Encounter (Signed)
I called pt regarding her concern with high cost of Brilinta and the fact that this is the recommended therapy for her per Dr. Bronson Ing. She was unable to tolerate Plavix in the past due to rash. I gave her the phone number ([phone]) to apply for patient assistance through [employer]. She is grateful for the information and says she will call today.  I also advised her that she can try stopping the amiodarone to see if this helps her hair loss.  She was very cheerful and appreciative of the call.

## 2018-10-26 DIAGNOSIS — Z992 Dependence on renal dialysis: Secondary | ICD-10-CM | POA: Diagnosis not present

## 2018-10-26 DIAGNOSIS — N186 End stage renal disease: Secondary | ICD-10-CM | POA: Diagnosis not present

## 2018-10-29 DIAGNOSIS — N186 End stage renal disease: Secondary | ICD-10-CM | POA: Diagnosis not present

## 2018-10-29 DIAGNOSIS — Z992 Dependence on renal dialysis: Secondary | ICD-10-CM | POA: Diagnosis not present

## 2018-10-31 DIAGNOSIS — N186 End stage renal disease: Secondary | ICD-10-CM | POA: Diagnosis not present

## 2018-10-31 DIAGNOSIS — Z992 Dependence on renal dialysis: Secondary | ICD-10-CM | POA: Diagnosis not present

## 2018-11-02 DIAGNOSIS — Z992 Dependence on renal dialysis: Secondary | ICD-10-CM | POA: Diagnosis not present

## 2018-11-02 DIAGNOSIS — N186 End stage renal disease: Secondary | ICD-10-CM | POA: Diagnosis not present

## 2018-11-05 DIAGNOSIS — Z992 Dependence on renal dialysis: Secondary | ICD-10-CM | POA: Diagnosis not present

## 2018-11-05 DIAGNOSIS — N186 End stage renal disease: Secondary | ICD-10-CM | POA: Diagnosis not present

## 2018-11-05 DIAGNOSIS — N2581 Secondary hyperparathyroidism of renal origin: Secondary | ICD-10-CM | POA: Diagnosis not present

## 2018-11-06 ENCOUNTER — Ambulatory Visit: Payer: Medicare HMO | Admitting: Cardiovascular Disease

## 2018-11-07 DIAGNOSIS — Z992 Dependence on renal dialysis: Secondary | ICD-10-CM | POA: Diagnosis not present

## 2018-11-07 DIAGNOSIS — N186 End stage renal disease: Secondary | ICD-10-CM | POA: Diagnosis not present

## 2018-11-08 DIAGNOSIS — Z992 Dependence on renal dialysis: Secondary | ICD-10-CM | POA: Diagnosis not present

## 2018-11-08 DIAGNOSIS — N186 End stage renal disease: Secondary | ICD-10-CM | POA: Diagnosis not present

## 2018-11-09 ENCOUNTER — Telehealth: Payer: Self-pay | Admitting: Cardiovascular Disease

## 2018-11-09 DIAGNOSIS — N186 End stage renal disease: Secondary | ICD-10-CM | POA: Diagnosis not present

## 2018-11-09 DIAGNOSIS — Z992 Dependence on renal dialysis: Secondary | ICD-10-CM | POA: Diagnosis not present

## 2018-11-09 NOTE — Telephone Encounter (Signed)
Spoke with pt who has just arrived home from dialysis. Pt reports that her BP has been 93/? During dialysis and at home. Heart rate during dialysis today was 128. She states that during dialysis she was given fluids. BP during call is 117/64 with heart rate of 147. She c/o that when her BP is low she feels dizzy and has chest pain. Pt denies chest pain, SOB and feeling dizzy during call. Please advise.

## 2018-11-09 NOTE — Telephone Encounter (Signed)
Patient called stating that after taking dialysis today her BP started dropping.

## 2018-11-12 NOTE — Telephone Encounter (Signed)
Call placed to [facility].  Spoke with Shawna [name] & is aware of this ongoing BP issue with her.  They continue to have issue with her as well in how she perceives her numbers.  Thinks numbers are too high / too low at times and will self medicate during dialysis.  Typically she is not symptomatic during these times and continues to be a struggle for them as well.

## 2018-11-12 NOTE — Telephone Encounter (Signed)
[name] and I have actually addressed this several times with the patient. As [name] knows her well, I will ask her to speak with her.

## 2018-11-12 NOTE — Telephone Encounter (Signed)
Call placed to patient to remind her that we prefer that Dr. Lowanda Foster only manage her BP as we have discussed this multiple times over the last year.  Dialysis issues need to be managed at time of occurrence.  Informed patient that I have called [facility] to inform them as well.  She verbalized understanding.

## 2018-11-17 DIAGNOSIS — N186 End stage renal disease: Secondary | ICD-10-CM | POA: Diagnosis not present

## 2018-11-17 DIAGNOSIS — Z992 Dependence on renal dialysis: Secondary | ICD-10-CM | POA: Diagnosis not present

## 2018-11-17 DIAGNOSIS — E8779 Other fluid overload: Secondary | ICD-10-CM | POA: Diagnosis not present

## 2018-11-19 DIAGNOSIS — D631 Anemia in chronic kidney disease: Secondary | ICD-10-CM | POA: Diagnosis not present

## 2018-11-19 DIAGNOSIS — N2581 Secondary hyperparathyroidism of renal origin: Secondary | ICD-10-CM | POA: Diagnosis not present

## 2018-11-20 ENCOUNTER — Telehealth: Payer: Self-pay | Admitting: Cardiovascular Disease

## 2018-11-20 NOTE — Telephone Encounter (Signed)
Returned pt call. She states that for past 2 months her blood pressure has been staying too low "for her" as her bp use to run 160's / 80's. She states when she checks it, it is 110/60 and it makes her feel horrible when it gets that low. She has stopped taking clonidine, hydralazine all together, and only takes coreg when her heart rate is high (120's or higher) She is still taking the isosorbide daily. She knows dialysis makes her bp drop but she wanted to let cardiologist know what has been going on. She tries to drink and eat snacks most days, but it does not seem to help. Please advise.

## 2018-11-20 NOTE — Telephone Encounter (Signed)
Pt's BP is dropping everyday and while she's doing dialysis. She would like someone to please give her a call.

## 2018-11-21 NOTE — Telephone Encounter (Signed)
I reviewed your note as well as J. Phylliss Bob NP's note from 10/22/18 telehealth visit, and agree with all of her recommendations.

## 2018-11-21 NOTE — Telephone Encounter (Signed)
Pt called back again to go over her BP readings, she is currently on dialysis.

## 2018-11-21 NOTE — Telephone Encounter (Signed)
Returned pt call. No answer, left message for pt to return call.  

## 2018-11-22 NOTE — Telephone Encounter (Signed)
Spoke with pt, went back over office visit instructions for 4/13 (telehealth with [place]) reminded her of instructions. She stated that dialysis is taking a lot out of her. She will be sure to eat and drink more after it is over.

## 2018-11-26 DIAGNOSIS — N186 End stage renal disease: Secondary | ICD-10-CM | POA: Diagnosis not present

## 2018-11-26 DIAGNOSIS — Z992 Dependence on renal dialysis: Secondary | ICD-10-CM | POA: Diagnosis not present

## 2018-12-04 ENCOUNTER — Other Ambulatory Visit: Payer: Self-pay

## 2018-12-04 ENCOUNTER — Encounter (INDEPENDENT_AMBULATORY_CARE_PROVIDER_SITE_OTHER): Payer: Self-pay | Admitting: Internal Medicine

## 2018-12-04 ENCOUNTER — Ambulatory Visit (INDEPENDENT_AMBULATORY_CARE_PROVIDER_SITE_OTHER): Payer: Medicare HMO | Admitting: Internal Medicine

## 2018-12-04 VITALS — BP 181/64 | HR 77 | Temp 98.3°F | Ht 61.0 in | Wt 131.0 lb

## 2018-12-04 DIAGNOSIS — K580 Irritable bowel syndrome with diarrhea: Secondary | ICD-10-CM

## 2018-12-04 NOTE — Progress Notes (Signed)
Subjective:    Patient ID: Shawna [name], female    DOB: 1941/05/31, 78 y.o.   MRN: [id number]  HPI Here today for f/u. Last seen in office August 2019 with c/o dark stools. She tells me after she eats, she has to run to the bathroom to have a  BM. Sometimes her stools are loose and sometimes her stools are firm. If she eats spinach, it will give her diarrhea. Symptoms for a couple of months.  Her appetite okay. No weight loss. Has a BM about 3-5 times a day.  No recent antibiotics.    She underwent an EGD in January of 2018  Showed a 2 cm hiatal hernia. Normal stomach. A single duodenal versus jejunal polyp- suspect adenoma, not removed. A single angiodysplastic lesion in the jejunum.  Treated with argon plasma coagulation . (Anemia secondary to blood loss). Dr. Havery Moros.  Hx of GI bleed due to small bowel angiodysplasia.  Dialysis M-W-F.  Has 2 cardiac stents, hx of TIA and maintained on Brilinta.   Last colonoscopy in 2013 by Dr. Britta Mccreedy, Heme positive stool, anemia.  Two polyps in cecum removed. Moderate diverticulosis thru out the colon. Surgical anastomosis, patent, involving the DC to rectum. Sessile polyp in the descending colon. All polyps were tubular adenoma.  CBC    CBC Latest Ref Rng & Units 07/14/2018 07/13/2018 03/08/2018  WBC 4.0 - 10.5 K/uL 9.7 6.7 -  Hemoglobin 12.0 - 15.0 g/dL 10.6(L) 11.5(L) 9.8(L)  Hematocrit 36.0 - 46.0 % 33.4(L) 36.6 30.8(L)  Platelets 150 - 400 K/uL 159 155 -      Review of Systems Past Medical History:  Diagnosis Date   Acute on chronic respiratory failure with hypoxia (HCC) 10/10/2016   Anxiety    Arthritis    AVM (arteriovenous malformation) of colon    CAD (coronary artery disease)    a. s/p CABG in 2013 b. most recent DES to D1 in 10/2016 with cath showing patent LIMA-LAD, SVG-RI, SVG-Mrg and SVG-RCA  c. NST in 02/2018 showing scar with no significant ischemia   Carotid artery disease (HCC)    a. 10-25% LICA, 02/5276     Chronic bronchitis (HCC)    Chronic diastolic CHF (congestive heart failure) ([place])    a. 02/2012 Echo EF 60-65%, nl wall motion, Gr 1 DD, mod MR   Colon cancer ([place]) 1992   Esophageal stricture    ESRD on hemodialysis ([place])    ESRD due to HTN, started dialysis 2011 and gets HD at [facility] with Dr Hinda Lenis on MWF schedule.  Access is LUA AVF as of Sept 2014.    GERD (gastroesophageal reflux disease)    High cholesterol 12/2011   History of blood transfusion 07/2011; 12/2011; 01/2012 X 2; 04/2012   History of gout    History of lower GI bleeding    Hypertension    Iron deficiency anemia    Mitral regurgitation    a. Moderate by echo, 02/2012   Myocardial infarction ([place])    Ovarian cancer ([place]) 1992   Pneumonia ~ 2009   PUD (peptic ulcer disease)    TIA (transient ischemic attack)     Past Surgical History:  Procedure Laterality Date   ABDOMINAL HYSTERECTOMY  1992   APPENDECTOMY  06/1990   AV FISTULA PLACEMENT  07/2009   left upper arm   AV FISTULA PLACEMENT Right 09/06/2016   Procedure: RIGHT FOREARM ARTERIOVENOUS (AV) GRAFT;  Surgeon: Elam Dutch, MD;  Location: [place];  Service:  Vascular;  Laterality: Right;   AV FISTULA PLACEMENT N/A 02/24/2017   Procedure: INSERTION OF ARTERIOVENOUS (AV) GORE-TEX GRAFT ARM (BRACHIAL AXILLARY);  Surgeon: Katha Cabal, MD;  Location: [facility];  Service: Vascular;  Laterality: N/A;   [place] Right 09/06/2016   Procedure: REMOVAL OF Right Arm ARTERIOVENOUS GORETEX GRAFT and Vein Patch angioplasty of brachial artery;  Surgeon: Angelia Mould, MD;  Location: [place];  Service: Vascular;  Laterality: Right;   [place] WITH STENT PLACEMENT  12/15/11   "2"   CORONARY ANGIOPLASTY WITH STENT PLACEMENT  y/2013   "1; makes total of 3" (05/02/2012)   CORONARY ARTERY BYPASS GRAFT  06/13/2012   Procedure: CORONARY ARTERY BYPASS GRAFTING (CABG);  Surgeon: Grace Isaac, MD;  Location: [place];  Service: Open Heart Surgery;  Laterality: N/A;  cabg x four;  using left internal mammary artery, and left leg greater saphenous vein harvested endoscopically   CORONARY STENT INTERVENTION N/A 10/13/2016   Procedure: Coronary Stent Intervention;  Surgeon: Troy Sine, MD;  Location: [place] CV LAB;  Service: Cardiovascular;  Laterality: N/A;   DIALYSIS/PERMA CATHETER REMOVAL N/A 04/18/2017   Procedure: DIALYSIS/PERMA CATHETER REMOVAL;  Surgeon: Katha Cabal, MD;  Location: [place] CV LAB;  Service: Cardiovascular;  Laterality: N/A;   DILATION AND CURETTAGE OF UTERUS     ESOPHAGOGASTRODUODENOSCOPY  01/20/2012   Procedure: ESOPHAGOGASTRODUODENOSCOPY (EGD);  Surgeon: Ladene Artist, MD,FACG;  Location: [facility] ENDOSCOPY;  Service: Endoscopy;  Laterality: N/A;   ESOPHAGOGASTRODUODENOSCOPY N/A 03/26/2013   Procedure: ESOPHAGOGASTRODUODENOSCOPY (EGD);  Surgeon: Irene Shipper, MD;  Location: [facility] ENDOSCOPY;  Service: Endoscopy;  Laterality: N/A;   ESOPHAGOGASTRODUODENOSCOPY N/A 04/30/2015   Procedure: ESOPHAGOGASTRODUODENOSCOPY (EGD);  Surgeon: Rogene Houston, MD;  [facility];  Service: Endoscopy;  Laterality: N/A;  1pm - moved to 10/20 @ 1:10   ESOPHAGOGASTRODUODENOSCOPY N/A 07/29/2016   Procedure: ESOPHAGOGASTRODUODENOSCOPY (EGD);  Surgeon: Manus Gunning, MD;  Location: [place];  Service: Gastroenterology;  Laterality: N/A;  enteroscopy   INTRAOPERATIVE TRANSESOPHAGEAL ECHOCARDIOGRAM  06/13/2012   Procedure: INTRAOPERATIVE TRANSESOPHAGEAL ECHOCARDIOGRAM;  Surgeon: Grace Isaac, MD;  Location: [place];  Service: Open Heart Surgery;  Laterality: N/A;   IR GENERIC HISTORICAL  07/26/2016   IR FLUORO GUIDE CV LINE RIGHT 07/26/2016 Greggory Keen, MD [facility]   IR GENERIC HISTORICAL  07/26/2016   IR US GUIDE VASC ACCESS RIGHT 07/26/2016 Greggory Keen, MD [facility]   IR GENERIC HISTORICAL  08/02/2016   IR US GUIDE VASC ACCESS  RIGHT 08/02/2016 Greggory Keen, MD [facility]   IR GENERIC HISTORICAL  08/02/2016   IR FLUORO GUIDE CV LINE RIGHT 08/02/2016 Greggory Keen, MD [facility]   IR RADIOLOGY PERIPHERAL GUIDED IV START  03/28/2017   IR US GUIDE VASC ACCESS RIGHT  03/28/2017   LEFT HEART CATH AND CORONARY ANGIOGRAPHY N/A 09/20/2016   Procedure: Left Heart Cath and Coronary Angiography;  Surgeon: Shawna Crome, MD;  Location: [place] CV LAB;  Service: Cardiovascular;  Laterality: N/A;   LEFT HEART CATH AND CORS/GRAFTS ANGIOGRAPHY N/A 10/13/2016   Procedure: Left Heart Cath and Cors/Grafts Angiography;  Surgeon: Troy Sine, MD;  Location: [place] CV LAB;  Service: Cardiovascular;  Laterality: N/A;   LEFT HEART CATH AND CORS/GRAFTS ANGIOGRAPHY N/A 07/13/2018   Procedure: LEFT HEART CATH AND CORS/GRAFTS ANGIOGRAPHY;  Surgeon: Martinique, Peter M, MD;  Location: [place] CV LAB;  Service: Cardiovascular;  Laterality: N/A;   LEFT HEART CATHETERIZATION WITH CORONARY ANGIOGRAM N/A 12/15/2011   Procedure: LEFT HEART CATHETERIZATION WITH CORONARY ANGIOGRAM;  Surgeon: Burnell Blanks, MD;  Location: [facility] CATH LAB;  Service: Cardiovascular;  Laterality: N/A;   LEFT HEART CATHETERIZATION WITH CORONARY ANGIOGRAM N/A 01/10/2012   Procedure: LEFT HEART CATHETERIZATION WITH CORONARY ANGIOGRAM;  Surgeon: Peter M Martinique, MD;  Location: [facility] CATH LAB;  Service: Cardiovascular;  Laterality: N/A;   LEFT HEART CATHETERIZATION WITH CORONARY ANGIOGRAM N/A 06/08/2012   Procedure: LEFT HEART CATHETERIZATION WITH CORONARY ANGIOGRAM;  Surgeon: Burnell Blanks, MD;  Location: [facility] CATH LAB;  Service: Cardiovascular;  Laterality: N/A;   LEFT HEART CATHETERIZATION WITH CORONARY/GRAFT ANGIOGRAM N/A 12/10/2013   Procedure: LEFT HEART CATHETERIZATION WITH Beatrix Fetters;  Surgeon: [name], MD;  Location: [facility] CATH LAB;  Service: Cardiovascular;  Laterality: N/A;   OVARY SURGERY     ovarian cancer   REVISION OF  ARTERIOVENOUS GORETEX GRAFT N/A 02/24/2017   Procedure: REVISION OF ARTERIOVENOUS GORETEX GRAFT (RESECTION);  Surgeon: Katha Cabal, MD;  Location: [facility];  Service: Vascular;  Laterality: N/A;   SHUNTOGRAM N/A 10/15/2013   Procedure: Fistulogram;  Surgeon: Serafina Mitchell, MD;  Location: [facility] CATH LAB;  Service: Cardiovascular;  Laterality: N/A;   THROMBECTOMY / ARTERIOVENOUS GRAFT REVISION  2011   left upper arm   TUBAL LIGATION  1980's   UPPER EXTREMITY ANGIOGRAPHY Bilateral 12/06/2016   Procedure: Upper Extremity Angiography;  Surgeon: Katha Cabal, MD;  Location: [place] CV LAB;  Service: Cardiovascular;  Laterality: Bilateral;   UPPER EXTREMITY INTERVENTION Left 06/06/2017   Procedure: UPPER EXTREMITY INTERVENTION;  Surgeon: Katha Cabal, MD;  Location: [place] CV LAB;  Service: Cardiovascular;  Laterality: Left;    Allergies  Allergen Reactions   Aspirin Other (See Comments)    High Doses Mess up her stomach; "makes my bowels have blood in them". Takes 81 mg EC Aspirin    Penicillins Other (See Comments)    SYNCOPE? , "makes me real weak when I take it; like I'll pass out"  Has patient had a PCN reaction causing immediate rash, facial/tongue/throat swelling, SOB or lightheadedness with hypotension: Yes Has patient had a PCN reaction causing severe rash involving mucus membranes or skin necrosis: no Has patient had a PCN reaction that required hospitalization no Has patient had a PCN reaction occurring within the last 10 years: no If all of the above   Amlodipine Swelling   Bactrim [Sulfamethoxazole-Trimethoprim] Rash   Contrast Media [Iodinated Diagnostic Agents] Itching   Iron Itching and Other (See Comments)    "they gave me iron in dialysis; had to give me Benadryl cause I had to have the iron" (05/02/2012)   Nitrofurantoin Hives   Tylenol [Acetaminophen] Itching and Other (See Comments)    Makes her feet on fire per pt   Gabapentin  Other (See Comments)    Unknown reaction   Dexilant [Dexlansoprazole] Other (See Comments)    Upset stomach   Levaquin [Levofloxacin In D5w] Rash   Morphine And Related Itching and Other (See Comments)    Itching in feet   Plavix [Clopidogrel Bisulfate] Rash   Protonix [Pantoprazole Sodium] Rash   Venofer [Ferric Oxide] Itching and Other (See Comments)    Patient reports using Benadryl prior to doses as [facility]    Current Outpatient Medications on File Prior to Visit  Medication Sig Dispense Refill   ALPRAZolam (XANAX) 0.5 MG tablet Take 0.5  mg by mouth daily.     amiodarone (PACERONE) 200 MG tablet Take 1 tablet (200 mg total) by mouth daily. 30 tablet 0   bisacodyl (DULCOLAX) 10 MG suppository Place 1 suppository (10 mg total) rectally as needed for moderate constipation. 12 suppository 0   carvedilol (COREG) 6.25 MG tablet Take 6.25 mg by mouth 2 (two) times daily with a meal.     cinacalcet (SENSIPAR) 30 MG tablet Take 30 mg by mouth daily after supper.     diphenhydrAMINE (BENADRYL) 25 mg capsule Take 50 mg by mouth daily as needed for itching or allergies.      diphenoxylate-atropine (LOMOTIL) 2.5-0.025 MG tablet Take 1 tablet by mouth 4 (four) times daily as needed for diarrhea or loose stools. 30 tablet 0   epoetin alfa (EPOGEN,PROCRIT) 40347 UNIT/ML injection Inject 1 mL (10,000 Units total) into the vein every Monday, Wednesday, and Friday with hemodialysis. 1 mL 10   fluticasone (FLONASE) 50 MCG/ACT nasal spray Place 1 spray at bedtime as needed into both nostrils for allergies.      isosorbide mononitrate (IMDUR) 120 MG 24 hr tablet Take 1 tablet by mouth once daily 90 tablet 2   lidocaine-prilocaine (EMLA) cream Apply 1 application topically every Monday, Wednesday, and Friday. Prior to dialysis     loratadine (CLARITIN) 10 MG tablet Take 1 tablet (10 mg total) by mouth daily as needed for allergies. 30 tablet 0   multivitamin (RENA-VIT) TABS tablet  Take 1 tablet by mouth daily.     nitroGLYCERIN (NITROSTAT) 0.4 MG SL tablet DISSOLVE ONE TABLET UNDER THE TONGUE EVERY 5 MINUTES AS NEEDED FOR CHEST PAIN.  DO NOT EXCEED A TOTAL OF 3 DOSES IN 15 MINUTES 25 tablet 2   omeprazole (PRILOSEC) 20 MG capsule Take 1 capsule (20 mg total) by mouth daily. 90 capsule 3   ondansetron (ZOFRAN-ODT) 4 MG disintegrating tablet Take 4 mg by mouth every 8 (eight) hours as needed for nausea or vomiting.      sevelamer carbonate (RENVELA) 800 MG tablet Take 800-2,400 mg by mouth See admin instructions. Take 3 tablets (2400) by mouth twice daily with meals, take 1 tablet (800 mg) with snacks     simvastatin (ZOCOR) 20 MG tablet TAKE ONE TABLET (20 MG) BY MOUTH DAILY AT BEDTIME 90 tablet 3   ticagrelor (BRILINTA) 60 MG TABS tablet Take 1 tablet (60 mg total) by mouth 2 (two) times daily. 60 tablet 6   ferrous sulfate 325 (65 FE) MG EC tablet Take 1 tablet (325 mg total) by mouth 2 (two) times daily with a meal. (Patient taking differently: Take 325 mg by mouth daily with breakfast. ) 60 tablet 0   No current facility-administered medications on file prior to visit.         Objective:   Physical Exam Blood pressure (!) 181/64, pulse 77, temperature 98.3 F (36.8 C), height 5\' 1"  (1.549 m), weight 131 lb (59.4 kg). Alert and oriented. Skin warm and dry. Oral mucosa is moist.   . Sclera anicteric, conjunctivae is pink. Thyroid not enlarged. No cervical lymphadenopathy. Lungs clear. Heart regular rate and rhythm.  Abdomen is soft. Bowel sounds are positive. No hepatomegaly. No abdominal masses felt. No tenderness.  No edema to lower extremities          Assessment & Plan:  ? IBS. Am going to start her own fiber once a day. Can take Imodium as needed OV in 6 months.

## 2018-12-04 NOTE — Patient Instructions (Signed)
Try Fiber gummies. Imodium as needed.

## 2018-12-05 DIAGNOSIS — N2581 Secondary hyperparathyroidism of renal origin: Secondary | ICD-10-CM | POA: Diagnosis not present

## 2018-12-06 DIAGNOSIS — L299 Pruritus, unspecified: Secondary | ICD-10-CM | POA: Diagnosis not present

## 2018-12-11 ENCOUNTER — Ambulatory Visit (HOSPITAL_COMMUNITY): Payer: Medicare HMO

## 2018-12-14 ENCOUNTER — Inpatient Hospital Stay (HOSPITAL_COMMUNITY): Payer: Medicare HMO

## 2018-12-14 ENCOUNTER — Other Ambulatory Visit: Payer: Self-pay

## 2018-12-14 ENCOUNTER — Emergency Department (HOSPITAL_COMMUNITY): Payer: Medicare HMO

## 2018-12-14 ENCOUNTER — Encounter (HOSPITAL_COMMUNITY): Payer: Self-pay

## 2018-12-14 ENCOUNTER — Inpatient Hospital Stay (HOSPITAL_COMMUNITY)
Admission: EM | Admit: 2018-12-14 | Discharge: 2018-12-15 | DRG: 302 | Disposition: A | Discharge status: Home / Self Care | Payer: Medicare HMO | Attending: Internal Medicine | Admitting: Internal Medicine

## 2018-12-14 DIAGNOSIS — Z885 Allergy status to narcotic agent status: Secondary | ICD-10-CM | POA: Diagnosis not present

## 2018-12-14 DIAGNOSIS — I361 Nonrheumatic tricuspid (valve) insufficiency: Secondary | ICD-10-CM | POA: Diagnosis not present

## 2018-12-14 DIAGNOSIS — Z955 Presence of coronary angioplasty implant and graft: Secondary | ICD-10-CM | POA: Diagnosis not present

## 2018-12-14 DIAGNOSIS — Z833 Family history of diabetes mellitus: Secondary | ICD-10-CM

## 2018-12-14 DIAGNOSIS — Z85038 Personal history of other malignant neoplasm of large intestine: Secondary | ICD-10-CM | POA: Diagnosis not present

## 2018-12-14 DIAGNOSIS — E7800 Pure hypercholesterolemia, unspecified: Secondary | ICD-10-CM | POA: Diagnosis present

## 2018-12-14 DIAGNOSIS — Z90710 Acquired absence of both cervix and uterus: Secondary | ICD-10-CM

## 2018-12-14 DIAGNOSIS — I10 Essential (primary) hypertension: Secondary | ICD-10-CM

## 2018-12-14 DIAGNOSIS — I25110 Atherosclerotic heart disease of native coronary artery with unstable angina pectoris: Secondary | ICD-10-CM | POA: Diagnosis not present

## 2018-12-14 DIAGNOSIS — D631 Anemia in chronic kidney disease: Secondary | ICD-10-CM | POA: Diagnosis not present

## 2018-12-14 DIAGNOSIS — Z8673 Personal history of transient ischemic attack (TIA), and cerebral infarction without residual deficits: Secondary | ICD-10-CM

## 2018-12-14 DIAGNOSIS — Z880 Allergy status to penicillin: Secondary | ICD-10-CM

## 2018-12-14 DIAGNOSIS — I4891 Unspecified atrial fibrillation: Secondary | ICD-10-CM | POA: Diagnosis present

## 2018-12-14 DIAGNOSIS — Z1159 Encounter for screening for other viral diseases: Secondary | ICD-10-CM

## 2018-12-14 DIAGNOSIS — I209 Angina pectoris, unspecified: Secondary | ICD-10-CM | POA: Diagnosis present

## 2018-12-14 DIAGNOSIS — I5032 Chronic diastolic (congestive) heart failure: Secondary | ICD-10-CM | POA: Diagnosis present

## 2018-12-14 DIAGNOSIS — I472 Ventricular tachycardia: Secondary | ICD-10-CM

## 2018-12-14 DIAGNOSIS — I120 Hypertensive chronic kidney disease with stage 5 chronic kidney disease or end stage renal disease: Secondary | ICD-10-CM | POA: Diagnosis not present

## 2018-12-14 DIAGNOSIS — I252 Old myocardial infarction: Secondary | ICD-10-CM

## 2018-12-14 DIAGNOSIS — E785 Hyperlipidemia, unspecified: Secondary | ICD-10-CM | POA: Diagnosis present

## 2018-12-14 DIAGNOSIS — D509 Iron deficiency anemia, unspecified: Secondary | ICD-10-CM | POA: Diagnosis present

## 2018-12-14 DIAGNOSIS — Z888 Allergy status to other drugs, medicaments and biological substances status: Secondary | ICD-10-CM

## 2018-12-14 DIAGNOSIS — Z7902 Long term (current) use of antithrombotics/antiplatelets: Secondary | ICD-10-CM

## 2018-12-14 DIAGNOSIS — J449 Chronic obstructive pulmonary disease, unspecified: Secondary | ICD-10-CM | POA: Diagnosis present

## 2018-12-14 DIAGNOSIS — F419 Anxiety disorder, unspecified: Secondary | ICD-10-CM | POA: Diagnosis present

## 2018-12-14 DIAGNOSIS — I340 Nonrheumatic mitral (valve) insufficiency: Secondary | ICD-10-CM | POA: Diagnosis present

## 2018-12-14 DIAGNOSIS — I161 Hypertensive emergency: Secondary | ICD-10-CM | POA: Diagnosis present

## 2018-12-14 DIAGNOSIS — I25708 Atherosclerosis of coronary artery bypass graft(s), unspecified, with other forms of angina pectoris: Secondary | ICD-10-CM | POA: Diagnosis not present

## 2018-12-14 DIAGNOSIS — M1990 Unspecified osteoarthritis, unspecified site: Secondary | ICD-10-CM | POA: Diagnosis present

## 2018-12-14 DIAGNOSIS — I132 Hypertensive heart and chronic kidney disease with heart failure and with stage 5 chronic kidney disease, or end stage renal disease: Secondary | ICD-10-CM | POA: Diagnosis not present

## 2018-12-14 DIAGNOSIS — N189 Chronic kidney disease, unspecified: Secondary | ICD-10-CM

## 2018-12-14 DIAGNOSIS — I6529 Occlusion and stenosis of unspecified carotid artery: Secondary | ICD-10-CM | POA: Diagnosis present

## 2018-12-14 DIAGNOSIS — Z8349 Family history of other endocrine, nutritional and metabolic diseases: Secondary | ICD-10-CM

## 2018-12-14 DIAGNOSIS — N2581 Secondary hyperparathyroidism of renal origin: Secondary | ICD-10-CM | POA: Diagnosis not present

## 2018-12-14 DIAGNOSIS — Z8543 Personal history of malignant neoplasm of ovary: Secondary | ICD-10-CM

## 2018-12-14 DIAGNOSIS — Z79899 Other long term (current) drug therapy: Secondary | ICD-10-CM

## 2018-12-14 DIAGNOSIS — R079 Chest pain, unspecified: Secondary | ICD-10-CM | POA: Diagnosis not present

## 2018-12-14 DIAGNOSIS — K219 Gastro-esophageal reflux disease without esophagitis: Secondary | ICD-10-CM | POA: Diagnosis not present

## 2018-12-14 DIAGNOSIS — I200 Unstable angina: Secondary | ICD-10-CM

## 2018-12-14 DIAGNOSIS — Z992 Dependence on renal dialysis: Secondary | ICD-10-CM

## 2018-12-14 DIAGNOSIS — Z8711 Personal history of peptic ulcer disease: Secondary | ICD-10-CM

## 2018-12-14 DIAGNOSIS — N186 End stage renal disease: Secondary | ICD-10-CM | POA: Diagnosis not present

## 2018-12-14 DIAGNOSIS — I953 Hypotension of hemodialysis: Secondary | ICD-10-CM | POA: Diagnosis not present

## 2018-12-14 DIAGNOSIS — Z951 Presence of aortocoronary bypass graft: Secondary | ICD-10-CM | POA: Diagnosis not present

## 2018-12-14 DIAGNOSIS — Z91041 Radiographic dye allergy status: Secondary | ICD-10-CM | POA: Diagnosis not present

## 2018-12-14 DIAGNOSIS — Z8249 Family history of ischemic heart disease and other diseases of the circulatory system: Secondary | ICD-10-CM

## 2018-12-14 DIAGNOSIS — R7989 Other specified abnormal findings of blood chemistry: Secondary | ICD-10-CM | POA: Diagnosis present

## 2018-12-14 DIAGNOSIS — I160 Hypertensive urgency: Secondary | ICD-10-CM | POA: Diagnosis present

## 2018-12-14 DIAGNOSIS — K222 Esophageal obstruction: Secondary | ICD-10-CM | POA: Diagnosis present

## 2018-12-14 DIAGNOSIS — R778 Other specified abnormalities of plasma proteins: Secondary | ICD-10-CM | POA: Diagnosis present

## 2018-12-14 DIAGNOSIS — E8889 Other specified metabolic disorders: Secondary | ICD-10-CM | POA: Diagnosis present

## 2018-12-14 DIAGNOSIS — I480 Paroxysmal atrial fibrillation: Secondary | ICD-10-CM | POA: Diagnosis present

## 2018-12-14 LAB — COMPREHENSIVE METABOLIC PANEL
ALT: 10 U/L (ref 0–44)
AST: 10 U/L — ABNORMAL LOW (ref 15–41)
Albumin: 3.6 g/dL (ref 3.5–5.0)
Alkaline Phosphatase: 125 U/L (ref 38–126)
Anion gap: 14 (ref 5–15)
BUN: 41 mg/dL — ABNORMAL HIGH (ref 8–23)
CO2: 27 mmol/L (ref 22–32)
Calcium: 11.2 mg/dL — ABNORMAL HIGH (ref 8.9–10.3)
Chloride: 101 mmol/L (ref 98–111)
Creatinine, Ser: 7.93 mg/dL — ABNORMAL HIGH (ref 0.44–1.00)
GFR calc Af Amer: 5 mL/min — ABNORMAL LOW (ref >60)
GFR calc non Af Amer: 4 mL/min — ABNORMAL LOW (ref >60)
Glucose, Bld: 94 mg/dL (ref 70–99)
Potassium: 4.1 mmol/L (ref 3.5–5.1)
Sodium: 142 mmol/L (ref 135–145)
Total Bilirubin: 0.6 mg/dL (ref 0.3–1.2)
Total Protein: 6.4 g/dL — ABNORMAL LOW (ref 6.5–8.1)

## 2018-12-14 LAB — CBC WITH DIFFERENTIAL/PLATELET
Abs Immature Granulocytes: 0.03 10*3/uL (ref 0.00–0.07)
Basophils Absolute: 0.1 10*3/uL (ref 0.0–0.1)
Basophils Relative: 1 %
Eosinophils Absolute: 0.3 10*3/uL (ref 0.0–0.5)
Eosinophils Relative: 4 %
HCT: 34.6 % — ABNORMAL LOW (ref 36.0–46.0)
Hemoglobin: 11 g/dL — ABNORMAL LOW (ref 12.0–15.0)
Immature Granulocytes: 0 %
Lymphocytes Relative: 13 %
Lymphs Abs: 1.1 10*3/uL (ref 0.7–4.0)
MCH: 34.9 pg — ABNORMAL HIGH (ref 26.0–34.0)
MCHC: 31.8 g/dL (ref 30.0–36.0)
MCV: 109.8 fL — ABNORMAL HIGH (ref 80.0–100.0)
Monocytes Absolute: 0.7 10*3/uL (ref 0.1–1.0)
Monocytes Relative: 9 %
Neutro Abs: 6.1 10*3/uL (ref 1.7–7.7)
Neutrophils Relative %: 73 %
Platelets: 153 10*3/uL (ref 150–400)
RBC: 3.15 MIL/uL — ABNORMAL LOW (ref 3.87–5.11)
RDW: 15.5 % (ref 11.5–15.5)
WBC: 8.3 10*3/uL (ref 4.0–10.5)
nRBC: 0 % (ref 0.0–0.2)

## 2018-12-14 LAB — RENAL FUNCTION PANEL
Albumin: 3.4 g/dL — ABNORMAL LOW (ref 3.5–5.0)
Anion gap: 15 (ref 5–15)
BUN: 54 mg/dL — ABNORMAL HIGH (ref 8–23)
CO2: 26 mmol/L (ref 22–32)
Calcium: 10.6 mg/dL — ABNORMAL HIGH (ref 8.9–10.3)
Chloride: 99 mmol/L (ref 98–111)
Creatinine, Ser: 9.2 mg/dL — ABNORMAL HIGH (ref 0.44–1.00)
GFR calc Af Amer: 4 mL/min — ABNORMAL LOW
GFR calc non Af Amer: 4 mL/min — ABNORMAL LOW
Glucose, Bld: 103 mg/dL — ABNORMAL HIGH (ref 70–99)
Phosphorus: 5.8 mg/dL — ABNORMAL HIGH (ref 2.5–4.6)
Potassium: 4.9 mmol/L (ref 3.5–5.1)
Sodium: 140 mmol/L (ref 135–145)

## 2018-12-14 LAB — CBC
HCT: 34.3 % — ABNORMAL LOW (ref 36.0–46.0)
HCT: 30 % — ABNORMAL LOW (ref 36.0–46.0)
Hemoglobin: 10.5 g/dL — ABNORMAL LOW (ref 12.0–15.0)
Hemoglobin: 9.4 g/dL — ABNORMAL LOW (ref 12.0–15.0)
MCH: 34.9 pg — ABNORMAL HIGH (ref 26.0–34.0)
MCH: 34.8 pg — ABNORMAL HIGH (ref 26.0–34.0)
MCHC: 30.6 g/dL (ref 30.0–36.0)
MCHC: 31.3 g/dL (ref 30.0–36.0)
MCV: 114 fL — ABNORMAL HIGH (ref 80.0–100.0)
MCV: 111.1 fL — ABNORMAL HIGH (ref 80.0–100.0)
Platelets: 133 10*3/uL — ABNORMAL LOW (ref 150–400)
Platelets: 152 K/uL (ref 150–400)
RBC: 3.01 MIL/uL — ABNORMAL LOW (ref 3.87–5.11)
RBC: 2.7 MIL/uL — ABNORMAL LOW (ref 3.87–5.11)
RDW: 15.7 % — ABNORMAL HIGH (ref 11.5–15.5)
RDW: 15.9 % — ABNORMAL HIGH (ref 11.5–15.5)
WBC: 8.1 10*3/uL (ref 4.0–10.5)
WBC: 9 K/uL (ref 4.0–10.5)
nRBC: 0 % (ref 0.0–0.2)
nRBC: 0 % (ref 0.0–0.2)

## 2018-12-14 LAB — TROPONIN I
Troponin I: 0.07 ng/mL (ref <0.03)
Troponin I: 0.11 ng/mL (ref <0.03)
Troponin I: 0.11 ng/mL (ref <0.03)
Troponin I: 0.09 ng/mL (ref <0.03)

## 2018-12-14 LAB — BASIC METABOLIC PANEL
Anion gap: 16 — ABNORMAL HIGH (ref 5–15)
BUN: 37 mg/dL — ABNORMAL HIGH (ref 8–23)
CO2: 29 mmol/L (ref 22–32)
Calcium: 11.6 mg/dL — ABNORMAL HIGH (ref 8.9–10.3)
Chloride: 98 mmol/L (ref 98–111)
Creatinine, Ser: 7.62 mg/dL — ABNORMAL HIGH (ref 0.44–1.00)
GFR calc Af Amer: 5 mL/min — ABNORMAL LOW (ref >60)
GFR calc non Af Amer: 5 mL/min — ABNORMAL LOW (ref >60)
Glucose, Bld: 109 mg/dL — ABNORMAL HIGH (ref 70–99)
Potassium: 3.7 mmol/L (ref 3.5–5.1)
Sodium: 143 mmol/L (ref 135–145)

## 2018-12-14 LAB — PROTIME-INR
INR: 1 (ref 0.8–1.2)
Prothrombin Time: 13.2 seconds (ref 11.4–15.2)

## 2018-12-14 LAB — APTT: aPTT: 34 seconds (ref 24–36)

## 2018-12-14 LAB — MRSA PCR SCREENING: MRSA by PCR: NEGATIVE

## 2018-12-14 LAB — HEPARIN LEVEL (UNFRACTIONATED)
Heparin Unfractionated: 0.64 IU/mL (ref 0.30–0.70)
Heparin Unfractionated: 0.44 IU/mL (ref 0.30–0.70)

## 2018-12-14 LAB — ECHOCARDIOGRAM COMPLETE
Height: 61 in
Weight: 2096 oz

## 2018-12-14 LAB — SARS CORONAVIRUS 2 BY RT PCR (HOSPITAL ORDER, PERFORMED IN ~~LOC~~ HOSPITAL LAB): SARS Coronavirus 2: NEGATIVE

## 2018-12-14 LAB — MAGNESIUM: Magnesium: 2 mg/dL (ref 1.7–2.4)

## 2018-12-14 IMAGING — CR PORTABLE CHEST - 1 VIEW
1 series · 2 of 2 positions shown · non-contrast
Comparison: [DATE]

CLINICAL DATA: Left chest pain

EXAM:
PORTABLE CHEST 1 VIEW

[Series 1: portable · 0.17mm/px · 2 of 2 slices shown]
[im 1/2]
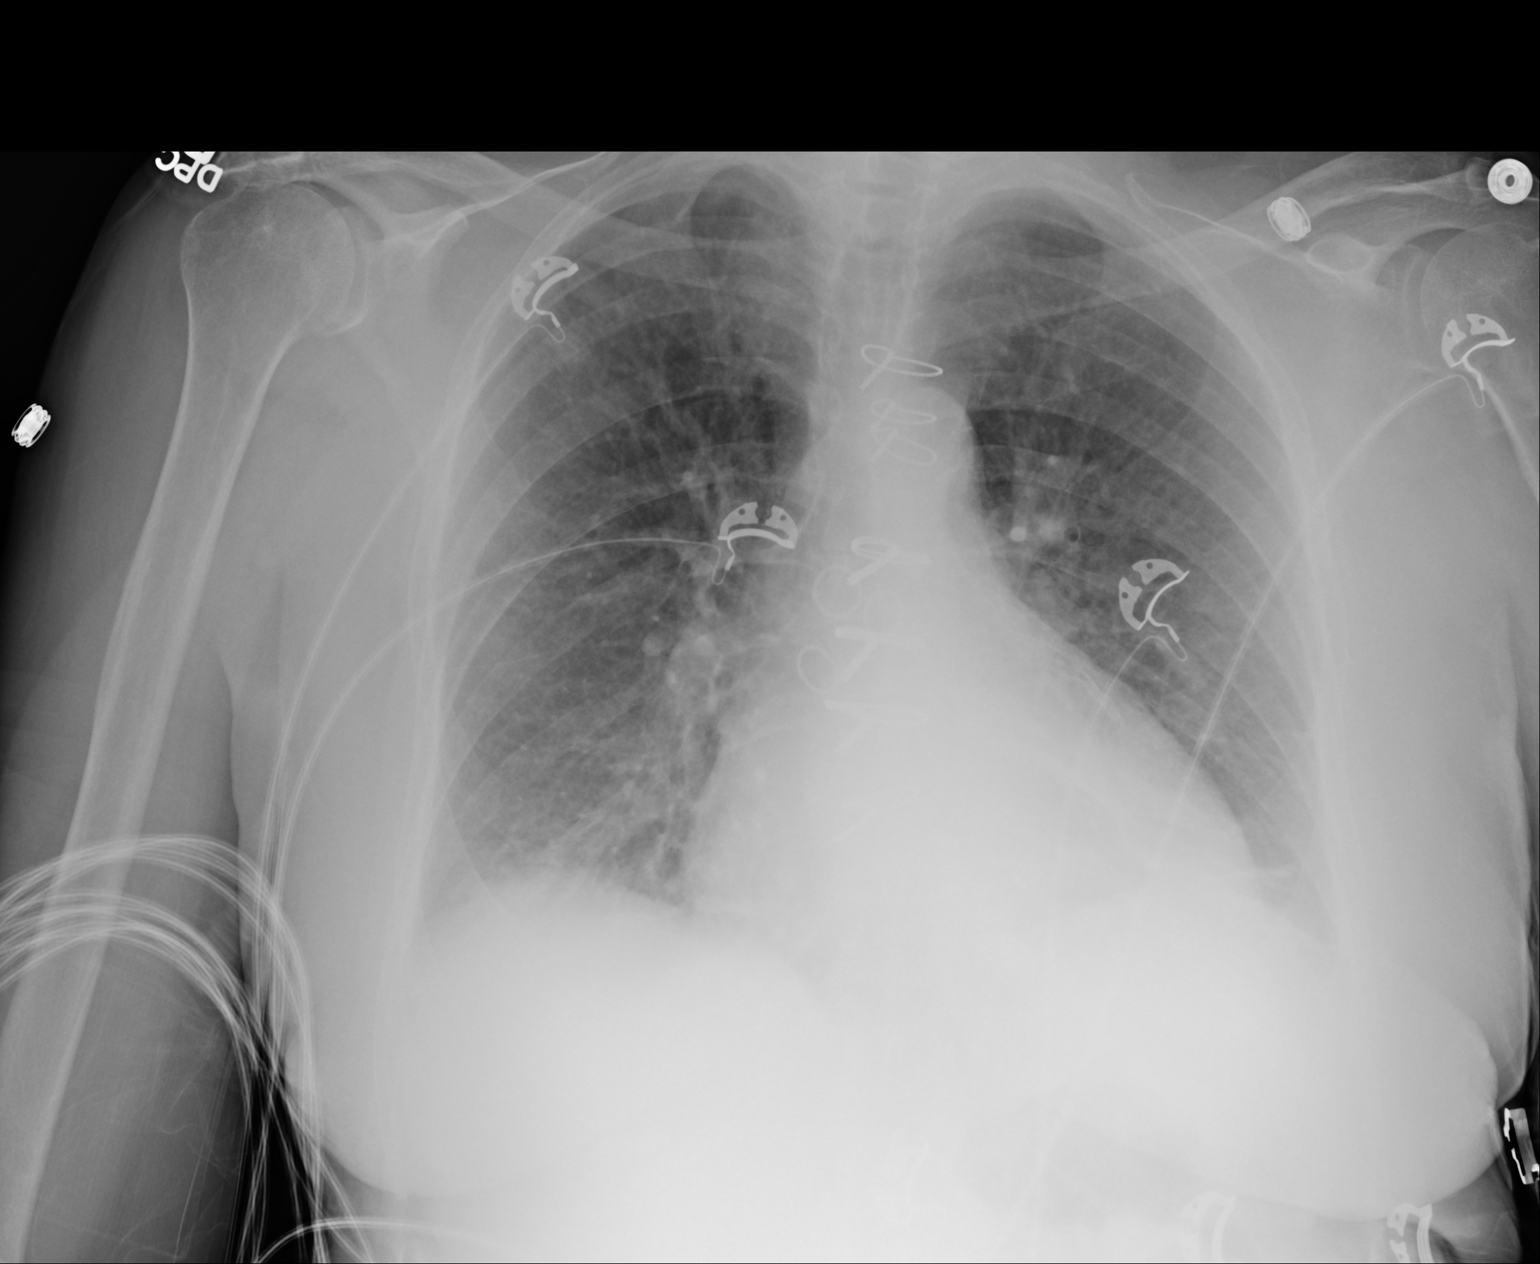
[im 2/2]
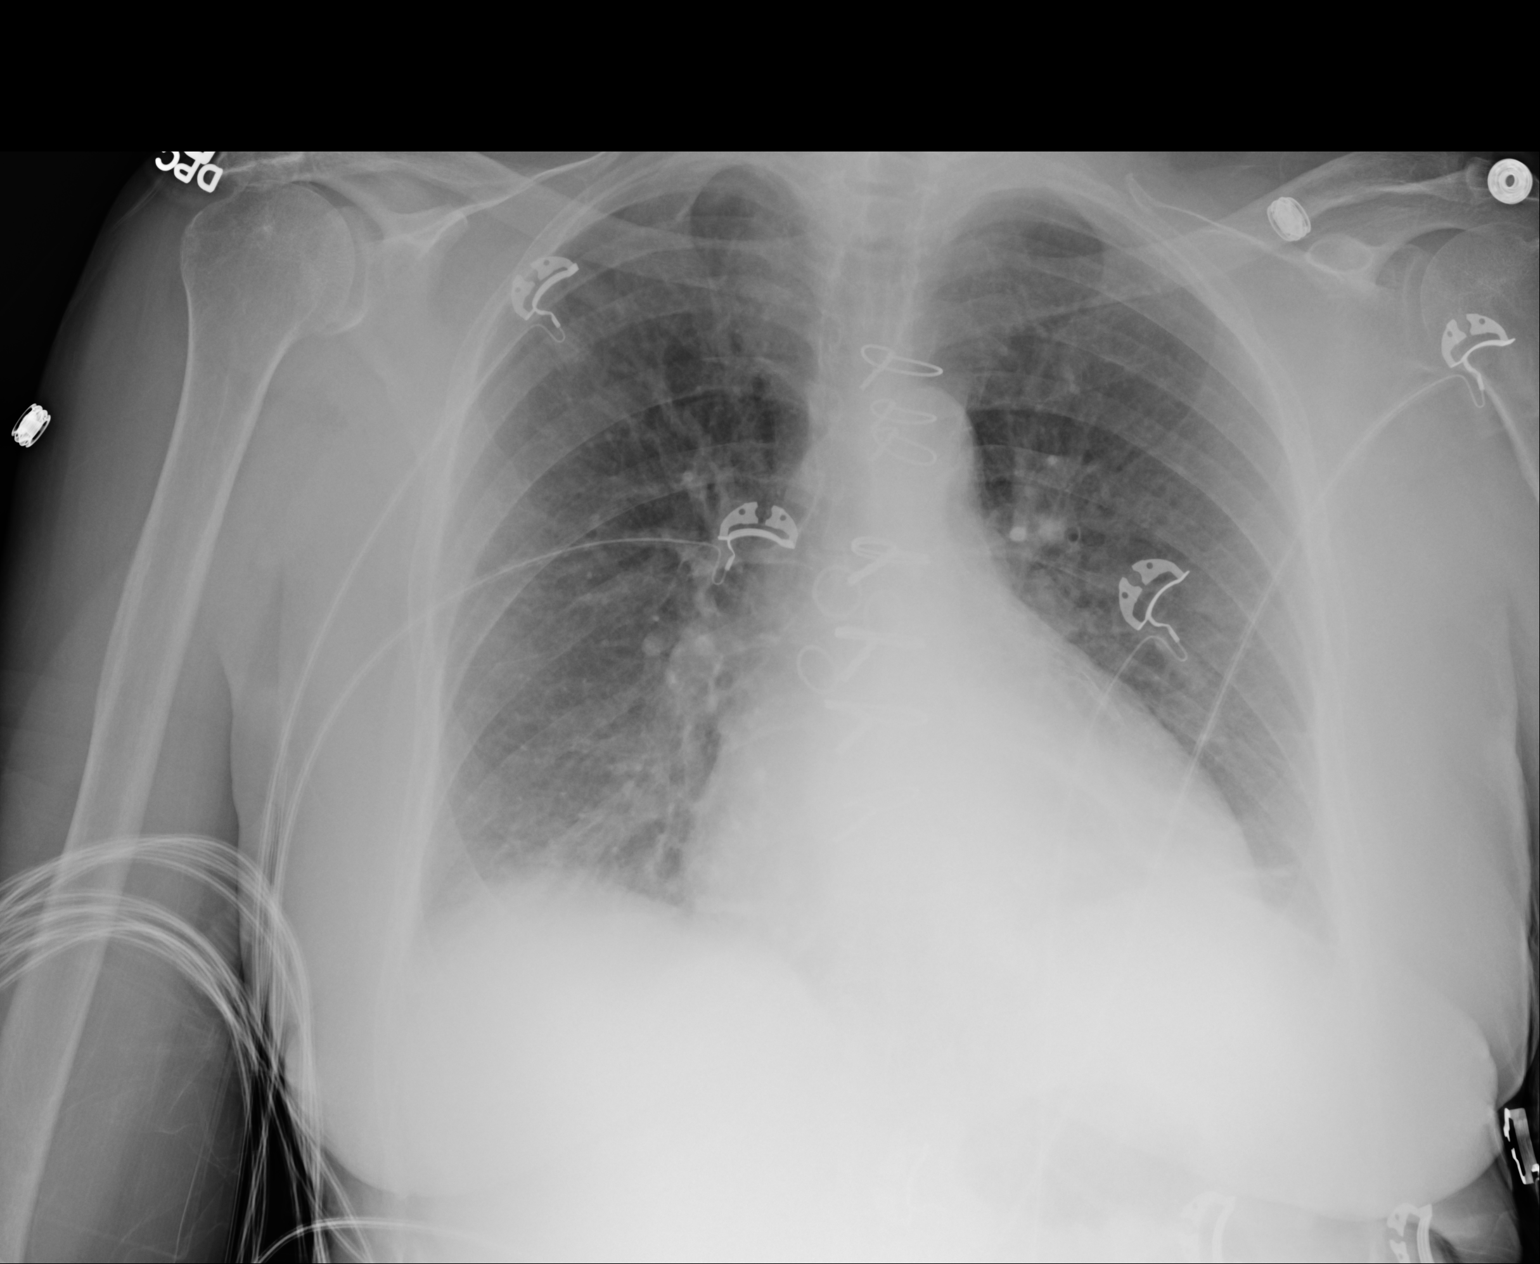

[2 of 2 positions shown; findings below may reference images not displayed]

FINDINGS: The heart size is enlarged. The patient is status post prior median
sternotomy. There is volume overload with small bilateral pleural
effusions. There are emphysematous changes bilaterally. No
pneumothorax. No large area of consolidation. There is likely
atelectasis versus scarring at the lung bases.
IMPRESSION: Cardiomegaly with volume overload and findings suspicious for
developing trace to small pleural effusions.

## 2018-12-14 MED ORDER — BISACODYL 10 MG RE SUPP: 10.0000 mg | RECTAL | Status: DC | PRN

## 2018-12-14 MED ORDER — ISOSORBIDE MONONITRATE ER 60 MG PO TB24
60.0000 mg | ORAL_TABLET | Freq: Every day | ORAL | Status: DC
  Administered 2018-12-15: 60 mg via ORAL
  Filled 2018-12-14 (×5): qty 1

## 2018-12-14 MED ORDER — OMEPRAZOLE 20 MG PO CPDR
20.0000 mg | DELAYED_RELEASE_CAPSULE | Freq: Every day | ORAL | Status: DC
  Filled 2018-12-14: qty 1

## 2018-12-14 MED ORDER — SEVELAMER CARBONATE 800 MG PO TABS
2400.0000 mg | ORAL_TABLET | Freq: Two times a day (BID) | ORAL | Status: DC
  Administered 2018-12-15: 2400 mg via ORAL
  Filled 2018-12-14 (×7): qty 3

## 2018-12-14 MED ORDER — METOPROLOL TARTRATE 5 MG/5ML IV SOLN
INTRAVENOUS | Status: AC
  Administered 2018-12-14: 5 mg
  Filled 2018-12-14: qty 5

## 2018-12-14 MED ORDER — AMIODARONE HCL 200 MG PO TABS
200.0000 mg | ORAL_TABLET | Freq: Every day | ORAL | Status: DC
  Administered 2018-12-15: 200 mg via ORAL
  Filled 2018-12-14: qty 1

## 2018-12-14 MED ORDER — ONDANSETRON 4 MG PO TBDP: 4.0000 mg | ORAL_TABLET | Freq: Three times a day (TID) | ORAL | Status: DC | PRN

## 2018-12-14 MED ORDER — METOPROLOL TARTRATE 5 MG/5ML IV SOLN
5.0000 mg | Freq: Once | INTRAVENOUS | Status: AC
  Administered 2018-12-14: 5 mg via INTRAVENOUS
  Filled 2018-12-14: qty 5

## 2018-12-14 MED ORDER — NEPHRO-VITE 0.8 MG PO TABS
1.0000 | ORAL_TABLET | Freq: Every day | ORAL | Status: DC
  Administered 2018-12-15: 1 via ORAL
  Filled 2018-12-14 (×5): qty 1

## 2018-12-14 MED ORDER — SODIUM CHLORIDE 0.9% FLUSH
3.0000 mL | Freq: Two times a day (BID) | INTRAVENOUS | Status: DC
  Administered 2018-12-15: 3 mL via INTRAVENOUS

## 2018-12-14 MED ORDER — HEPARIN BOLUS VIA INFUSION
3500.0000 [IU] | Freq: Once | INTRAVENOUS | Status: AC
  Administered 2018-12-14: 3500 [IU] via INTRAVENOUS

## 2018-12-14 MED ORDER — NITROGLYCERIN 2 % TD OINT
1.0000 [in_us] | TOPICAL_OINTMENT | Freq: Once | TRANSDERMAL | Status: AC
  Administered 2018-12-14: 1 [in_us] via TOPICAL
  Filled 2018-12-14: qty 1

## 2018-12-14 MED ORDER — HYDRALAZINE HCL 20 MG/ML IJ SOLN
10.0000 mg | Freq: Four times a day (QID) | INTRAMUSCULAR | Status: DC | PRN
  Administered 2018-12-14: 10 mg via INTRAVENOUS
  Filled 2018-12-14: qty 1

## 2018-12-14 MED ORDER — LORATADINE 10 MG PO TABS: 10.0000 mg | ORAL_TABLET | Freq: Every day | ORAL | Status: DC | PRN

## 2018-12-14 MED ORDER — CINACALCET HCL 30 MG PO TABS
30.0000 mg | ORAL_TABLET | Freq: Every day | ORAL | Status: DC
  Filled 2018-12-14 (×3): qty 1

## 2018-12-14 MED ORDER — LIDOCAINE HCL (PF) 1 % IJ SOLN: 5.0000 mL | INTRAMUSCULAR | Status: DC | PRN

## 2018-12-14 MED ORDER — SODIUM CHLORIDE 0.9% FLUSH: 3.0000 mL | INTRAVENOUS | Status: DC | PRN

## 2018-12-14 MED ORDER — SODIUM CHLORIDE 0.9 % IV SOLN: 100.0000 mL | INTRAVENOUS | Status: DC | PRN

## 2018-12-14 MED ORDER — TICAGRELOR 60 MG PO TABS
60.0000 mg | ORAL_TABLET | Freq: Two times a day (BID) | ORAL | Status: DC
  Administered 2018-12-15: 60 mg via ORAL
  Filled 2018-12-14 (×7): qty 1

## 2018-12-14 MED ORDER — ALPRAZOLAM 0.5 MG PO TABS
0.5000 mg | ORAL_TABLET | Freq: Every day | ORAL | Status: DC | PRN
  Administered 2018-12-14: 0.5 mg via ORAL
  Filled 2018-12-14: qty 1

## 2018-12-14 MED ORDER — DIPHENHYDRAMINE-ZINC ACETATE 2-0.1 % EX CREA
TOPICAL_CREAM | Freq: Three times a day (TID) | CUTANEOUS | Status: DC | PRN
  Filled 2018-12-14: qty 28

## 2018-12-14 MED ORDER — ALTEPLASE 2 MG IJ SOLR
2.0000 mg | Freq: Once | INTRAMUSCULAR | Status: DC | PRN
  Filled 2018-12-14: qty 2

## 2018-12-14 MED ORDER — SIMVASTATIN 20 MG PO TABS: 20.0000 mg | ORAL_TABLET | Freq: Every day | ORAL | Status: DC

## 2018-12-14 MED ORDER — HEPARIN SODIUM (PORCINE) 1000 UNIT/ML DIALYSIS
1000.0000 [IU] | INTRAMUSCULAR | Status: DC | PRN
  Filled 2018-12-14: qty 1

## 2018-12-14 MED ORDER — PENTAFLUOROPROP-TETRAFLUOROETH EX AERO: 1.0000 "application " | INHALATION_SPRAY | CUTANEOUS | Status: DC | PRN

## 2018-12-14 MED ORDER — SEVELAMER CARBONATE 800 MG PO TABS
800.0000 mg | ORAL_TABLET | ORAL | Status: DC
  Filled 2018-12-14 (×9): qty 1

## 2018-12-14 MED ORDER — ONDANSETRON HCL 4 MG/2ML IJ SOLN
4.0000 mg | Freq: Four times a day (QID) | INTRAMUSCULAR | Status: DC | PRN
  Administered 2018-12-14: 4 mg via INTRAVENOUS
  Filled 2018-12-14: qty 2

## 2018-12-14 MED ORDER — METOPROLOL TARTRATE 5 MG/5ML IV SOLN: 5.0000 mg | Freq: Once | INTRAVENOUS | Status: AC

## 2018-12-14 MED ORDER — FERROUS SULFATE 325 (65 FE) MG PO TBEC: 325.0000 mg | DELAYED_RELEASE_TABLET | Freq: Every day | ORAL | Status: DC

## 2018-12-14 MED ORDER — SODIUM CHLORIDE 0.9 % IV SOLN: 250.0000 mL | INTRAVENOUS | Status: DC | PRN

## 2018-12-14 MED ORDER — CARVEDILOL 3.125 MG PO TABS
6.2500 mg | ORAL_TABLET | Freq: Two times a day (BID) | ORAL | Status: DC
  Administered 2018-12-14 – 2018-12-15 (×2): 6.25 mg via ORAL
  Filled 2018-12-14 (×2): qty 2

## 2018-12-14 MED ORDER — LIDOCAINE-PRILOCAINE 2.5-2.5 % EX CREA: 1.0000 "application " | TOPICAL_CREAM | CUTANEOUS | Status: DC | PRN

## 2018-12-14 MED ORDER — CHLORHEXIDINE GLUCONATE CLOTH 2 % EX PADS
6.0000 | MEDICATED_PAD | Freq: Every day | CUTANEOUS | Status: DC
  Administered 2018-12-14 – 2018-12-15 (×2): 6 via TOPICAL

## 2018-12-14 MED ORDER — AMIODARONE HCL 200 MG PO TABS: 200.0000 mg | ORAL_TABLET | Freq: Every day | ORAL | Status: DC

## 2018-12-14 MED ORDER — NITROGLYCERIN IN D5W 200-5 MCG/ML-% IV SOLN
0.0000 ug/min | INTRAVENOUS | Status: DC
  Administered 2018-12-14: 10 ug/min via INTRAVENOUS
  Administered 2018-12-15: 50 ug/min via INTRAVENOUS
  Filled 2018-12-14 (×3): qty 250

## 2018-12-14 MED ORDER — PROMETHAZINE HCL 25 MG/ML IJ SOLN
12.5000 mg | Freq: Four times a day (QID) | INTRAMUSCULAR | Status: DC | PRN
  Filled 2018-12-14: qty 1

## 2018-12-14 MED ORDER — HEPARIN (PORCINE) 25000 UT/250ML-% IV SOLN
700.0000 [IU]/h | INTRAVENOUS | Status: DC
  Administered 2018-12-14: 700 [IU]/h via INTRAVENOUS
  Filled 2018-12-14: qty 250

## 2018-12-14 NOTE — Procedures (Signed)
    HEMODIALYSIS TREATMENT NOTE:  3.75 hour dialysis session completed via left upper arm AVF (15g/antegrade). Goal NOT met: Unable to tolerate removal of 3-4L as ordered due to symptomatic hypotension with loss of consciousness at BP of 133/56 about half-way through session.  NET UF 2.1 liters.  All blood was returned.  Hemostasis was achieved in 30 minutes (heparin drip via PIV).    Rockwell Alexandria, RN.

## 2018-12-14 NOTE — Progress Notes (Signed)
ANTICOAGULATION CONSULT NOTE -   Pharmacy Consult for heparin Indication: chest pain/ACS  Allergies  Allergen Reactions  . Aspirin Other (See Comments)    High Doses Mess up her stomach; "makes my bowels have blood in them". Takes 81 mg EC Aspirin   . Penicillins Other (See Comments)    SYNCOPE? , "makes me real weak when I take it; like I'll pass out"  Has patient had a PCN reaction causing immediate rash, facial/tongue/throat swelling, SOB or lightheadedness with hypotension: Yes Has patient had a PCN reaction causing severe rash involving mucus membranes or skin necrosis: no Has patient had a PCN reaction that required hospitalization no Has patient had a PCN reaction occurring within the last 10 years: no If all of the above  . Amlodipine Swelling  . Bactrim [Sulfamethoxazole-Trimethoprim] Rash  . Contrast Media [Iodinated Diagnostic Agents] Itching  . Iron Itching and Other (See Comments)    "they gave me iron in dialysis; had to give me Benadryl cause I had to have the iron" (05/02/2012)  . Nitrofurantoin Hives  . Tylenol [Acetaminophen] Itching and Other (See Comments)    Makes her feet on fire per pt  . Gabapentin Other (See Comments)    Unknown reaction  . Dexilant [Dexlansoprazole] Other (See Comments)    Upset stomach  . Levaquin [Levofloxacin In D5w] Rash  . Morphine And Related Itching and Other (See Comments)    Itching in feet  . Plavix [Clopidogrel Bisulfate] Rash  . Protonix [Pantoprazole Sodium] Rash  . Venofer [Ferric Oxide] Itching and Other (See Comments)    Patient reports using Benadryl prior to doses as [place]    Patient Measurements: Height: 5\' 1"  (154.9 cm) Weight: 131 lb (59.4 kg) IBW/kg (Calculated) : 47.8 HEPARIN DW (KG): 59.4   Vital Signs: BP: 174/75 (06/05 1015) Pulse Rate: 72 (06/05 0600)  Labs: Recent Labs    12/14/18 0143 12/14/18 0459 12/14/18 0941 12/14/18 1124 12/14/18 1611  HGB 11.0* 10.5*  --   --  9.4*  HCT  34.6* 34.3*  --   --  30.0*  PLT 153 133*  --   --  152  APTT 34  --   --   --   --   LABPROT 13.2  --   --   --   --   INR 1.0  --   --   --   --   HEPARINUNFRC  --   --  0.64  --  0.44  CREATININE 7.62* 7.93*  --   --  9.20*  TROPONINI 0.07* 0.11*  --  0.11* 0.09*   Estimated Creatinine Clearance: 4.2 mL/min (A) (by C-G formula based on SCr of 9.2 mg/dL (H)).  Medical History: Past Medical History:  Diagnosis Date  . Acute on chronic respiratory failure with hypoxia ([place]) 10/10/2016  . Anxiety   . Arthritis   . AVM (arteriovenous malformation) of colon   . CAD (coronary artery disease)    a. s/p CABG in 2013 b. most recent DES to D1 in 10/2016 with cath showing patent LIMA-LAD, SVG-RI, SVG-Mrg and SVG-RCA  c. NST in 02/2018 showing scar with no significant ischemia  . Carotid artery disease ([place])    a. 28-31% LICA, 11/1759   . Chronic bronchitis ([place])   . Chronic diastolic CHF (congestive heart failure) ([place])    a. 02/2012 Echo EF 60-65%, nl wall motion, Gr 1 DD, mod MR  . Colon cancer ([place]) 1992  . Esophageal stricture   .  ESRD on hemodialysis ([place])    ESRD due to HTN, started dialysis 2011 and gets HD at [facility] with Dr Hinda Lenis on MWF schedule.  Access is LUA AVF as of Sept 2014.   Marland Kitchen GERD (gastroesophageal reflux disease)   . High cholesterol 12/2011  . History of blood transfusion 07/2011; 12/2011; 01/2012 X 2; 04/2012  . History of gout   . History of lower GI bleeding   . Hypertension   . Iron deficiency anemia   . Mitral regurgitation    a. Moderate by echo, 02/2012  . Myocardial infarction ([place])   . Ovarian cancer ([place]) 1992  . Pneumonia ~ 2009  . PUD (peptic ulcer disease)   . TIA (transient ischemic attack)     Medications:  (Not in a hospital admission)   Assessment: 78 yo presented with chest pain.  Hx CAD, most recent cardiac cath in January 2020.  Pharmacy asked to start heparin gtt.    HL 0.44  Goal of Therapy:  Heparin level 0.3-0.7 units/ml   Monitor platelets per protocol   Plan:  Continue heparin infusion at 700 units/hr Check anti-Xa level daily while on heparin Continue to monitor H&H and platelets   Ramond Craver, RPH 12/14/2018,5:22 PM

## 2018-12-14 NOTE — Progress Notes (Signed)
*  PRELIMINARY RESULTS* Echocardiogram 2D Echocardiogram has been performed.  Leavy Cella 12/14/2018, 1:52 PM

## 2018-12-14 NOTE — Consult Note (Signed)
[facility] Reason for Consult: To manage dialysis and dialysis related needs Referring Physician: Dr Jani Gravel  HPI:  Shawna [name] is an 78 y.o. female with history of CAD status post a stent and CABG, hypertension, hyperlipidemia, A. fib not on anticoagulation because of GI bleed, NSVT, ESRD on hemodialysis MWF at [place] Dr. Lowanda Foster, admitted with chest pain for 1 day.  The chest pain came on at rest described as tight feeling.  Relieved with nitroglycerin.  EKG with nonspecific ST-T wave change.  Troponin is mildly elevated.  Patient had a cardiac cath in January this year with three-vessel occlusive disease, follows with cardiology.  Cardiologist consulted.  Patient is currently on IV heparin and nitroglycerin. Patient reported her last dialysis was on Wednesday for 3 hours 45 minutes.  She had episode of intradialytic hypotension.  She has left upper extremity AV fistula. Currently her blood pressure is elevated.  Chest pain better with nitroglycerin.  Reports some SOB.  She is on room air.  Chest x-ray with cardiomegaly and small pleural effusion.  The labs showed potassium 4.1, CO2 27, calcium 11.2, hemoglobin 10.5, platelet 133.   Past Medical History:  Diagnosis Date  . Acute on chronic respiratory failure with hypoxia ([place]) 10/10/2016  . Anxiety   . Arthritis   . AVM (arteriovenous malformation) of colon   . CAD (coronary artery disease)    a. s/p CABG in 2013 b. most recent DES to D1 in 10/2016 with cath showing patent LIMA-LAD, SVG-RI, SVG-Mrg and SVG-RCA  c. NST in 02/2018 showing scar with no significant ischemia  . Carotid artery disease ([place])    a. 06-26% LICA, 03/4853   . Chronic bronchitis ([place])   . Chronic diastolic CHF (congestive heart failure) ([place])    a. 02/2012 Echo EF 60-65%, nl wall motion, Gr 1 DD, mod MR  . Colon cancer ([place]) 1992  . Esophageal stricture   . ESRD on hemodialysis ([place])    ESRD due to HTN, started dialysis 2011 and gets HD at [facility] with Dr Hinda Lenis on MWF schedule.  Access is LUA AVF as of Sept 2014.   Marland Kitchen GERD (gastroesophageal reflux disease)   . High cholesterol 12/2011  . History of blood transfusion 07/2011; 12/2011; 01/2012 X 2; 04/2012  . History of gout   . History of lower GI bleeding   . Hypertension   . Iron deficiency anemia   . Mitral regurgitation    a. Moderate by echo, 02/2012  . Myocardial infarction ([place])   . Ovarian cancer ([place]) 1992  . Pneumonia ~ 2009  . PUD (peptic ulcer disease)   . TIA (transient ischemic attack)     Past Surgical History:  Procedure Laterality Date  . ABDOMINAL HYSTERECTOMY  1992  . APPENDECTOMY  06/1990  . AV FISTULA PLACEMENT  07/2009   left upper arm  . AV FISTULA PLACEMENT Right 09/06/2016   Procedure: RIGHT FOREARM ARTERIOVENOUS (AV) GRAFT;  Surgeon: Elam Dutch, MD;  Location[facility] OR;  Service: Vascular;  Laterality: Right;  . AV FISTULA PLACEMENT N/A 02/24/2017   Procedure: INSERTION OF ARTERIOVENOUS (AV) GORE-TEX GRAFT ARM (BRACHIAL AXILLARY);  Surgeon: Katha Cabal, MD;  Location: [facility] ORS;  Service: Vascular;  Laterality: N/A;  . [place] Right 09/06/2016   Procedure: REMOVAL OF Right Arm ARTERIOVENOUS GORETEX GRAFT and Vein Patch angioplasty of brachial artery;  Surgeon: Angelia Mould, MD;  Location: [place];  Service: Vascular;  Laterality: Right;  . COLON  RESECTION  1992  . COLON SURGERY    . CORONARY ANGIOPLASTY WITH STENT PLACEMENT  12/15/11   "2"  . CORONARY ANGIOPLASTY WITH STENT PLACEMENT  y/2013   "1; makes total of 3" (05/02/2012)  . CORONARY ARTERY BYPASS GRAFT  06/13/2012   Procedure: CORONARY ARTERY BYPASS GRAFTING (CABG);  Surgeon: Grace Isaac, MD;  Location: [place];  Service: Open Heart Surgery;  Laterality: N/A;  cabg x four;  using left internal mammary artery, and left leg greater saphenous vein harvested endoscopically  . CORONARY STENT INTERVENTION N/A 10/13/2016   Procedure: Coronary Stent Intervention;  Surgeon: Troy Sine, MD;  Location: [place] CV LAB;  Service: Cardiovascular;  Laterality: N/A;  . DIALYSIS/PERMA CATHETER REMOVAL N/A 04/18/2017   Procedure: DIALYSIS/PERMA CATHETER REMOVAL;  Surgeon: Katha Cabal, MD;  Location: [place] CV LAB;  Service: Cardiovascular;  Laterality: N/A;  . DILATION AND CURETTAGE OF UTERUS    . ESOPHAGOGASTRODUODENOSCOPY  01/20/2012   Procedure: ESOPHAGOGASTRODUODENOSCOPY (EGD);  Surgeon: Ladene Artist, MD,FACG;  Location: [facility] ENDOSCOPY;  Service: Endoscopy;  Laterality: N/A;  . ESOPHAGOGASTRODUODENOSCOPY N/A 03/26/2013   Procedure: ESOPHAGOGASTRODUODENOSCOPY (EGD);  Surgeon: Irene Shipper, MD;  Location: [facility] ENDOSCOPY;  Service: Endoscopy;  Laterality: N/A;  . ESOPHAGOGASTRODUODENOSCOPY N/A 04/30/2015   Procedure: ESOPHAGOGASTRODUODENOSCOPY (EGD);  Surgeon: Rogene Houston, MD;  [facility];  Service: Endoscopy;  Laterality: N/A;  1pm - moved to 10/20 @ 1:10  . ESOPHAGOGASTRODUODENOSCOPY N/A 07/29/2016   Procedure: ESOPHAGOGASTRODUODENOSCOPY (EGD);  Surgeon: Manus Gunning, MD;  Location: [place];  Service: Gastroenterology;  Laterality: N/A;  enteroscopy  . INTRAOPERATIVE TRANSESOPHAGEAL ECHOCARDIOGRAM  06/13/2012   Procedure: INTRAOPERATIVE TRANSESOPHAGEAL ECHOCARDIOGRAM;  Surgeon: Grace Isaac, MD;  Location: [place];  Service: Open Heart Surgery;  Laterality: N/A;  . IR GENERIC HISTORICAL  07/26/2016   IR FLUORO GUIDE CV LINE RIGHT 07/26/2016 Greggory Keen, MD [facility]INTER[facility]  . IR GENERIC HISTORICAL  07/26/2016   IR US GUIDE VASC ACCESS RIGHT 07/26/2016 Greggory Keen, MD [facility]  . IR GENERIC HISTORICAL  08/02/2016   IR US GUIDE VASC ACCESS RIGHT 08/02/2016 Greggory Keen, MD [facility]  . IR GENERIC HISTORICAL  08/02/2016   IR FLUORO GUIDE CV LINE RIGHT 08/02/2016 Greggory Keen, MD [facility]  . IR RADIOLOGY PERIPHERAL GUIDED IV START  03/28/2017  . IR US GUIDE VASC ACCESS RIGHT  03/28/2017  . LEFT HEART CATH AND CORONARY  ANGIOGRAPHY N/A 09/20/2016   Procedure: Left Heart Cath and Coronary Angiography;  Surgeon: [name], MD;  Location: [place] CV LAB;  Service: Cardiovascular;  Laterality: N/A;  . LEFT HEART CATH AND CORS/GRAFTS ANGIOGRAPHY N/A 10/13/2016   Procedure: Left Heart Cath and Cors/Grafts Angiography;  Surgeon: Troy Sine, MD;  Location: [place] CV LAB;  Service: Cardiovascular;  Laterality: N/A;  . LEFT HEART CATH AND CORS/GRAFTS ANGIOGRAPHY N/A 07/13/2018   Procedure: LEFT HEART CATH AND CORS/GRAFTS ANGIOGRAPHY;  Surgeon: Martinique, Peter M, MD;  Location: [place] CV LAB;  Service: Cardiovascular;  Laterality: N/A;  . LEFT HEART CATHETERIZATION WITH CORONARY ANGIOGRAM N/A 12/15/2011   Procedure: LEFT HEART CATHETERIZATION WITH CORONARY ANGIOGRAM;  Surgeon: Burnell Blanks, MD;  Location: [facility] CATH LAB;  Service: Cardiovascular;  Laterality: N/A;  . LEFT HEART CATHETERIZATION WITH CORONARY ANGIOGRAM N/A 01/10/2012   Procedure: LEFT HEART CATHETERIZATION WITH CORONARY ANGIOGRAM;  Surgeon: Peter M Martinique, MD;  Location: [facility] CATH LAB;  Service: Cardiovascular;  Laterality: N/A;  . LEFT HEART  CATHETERIZATION WITH CORONARY ANGIOGRAM N/A 06/08/2012   Procedure: LEFT HEART CATHETERIZATION WITH CORONARY ANGIOGRAM;  Surgeon: Burnell Blanks, MD;  Location: [facility] CATH LAB;  Service: Cardiovascular;  Laterality: N/A;  . LEFT HEART CATHETERIZATION WITH CORONARY/GRAFT ANGIOGRAM N/A 12/10/2013   Procedure: LEFT HEART CATHETERIZATION WITH Beatrix Fetters;  Surgeon: [name], MD;  Location: [facility] CATH LAB;  Service: Cardiovascular;  Laterality: N/A;  . OVARY SURGERY     ovarian cancer  . REVISION OF ARTERIOVENOUS GORETEX GRAFT N/A 02/24/2017   Procedure: REVISION OF ARTERIOVENOUS GORETEX GRAFT (RESECTION);  Surgeon: Katha Cabal, MD;  Location: [facility] ORS;  Service: Vascular;  Laterality: N/A;  [place] N/A 10/15/2013   Procedure: Fistulogram;  Surgeon: Serafina Mitchell, MD;   Location: [name] CATH LAB;  Service: Cardiovascular;  Laterality: N/A;  . THROMBECTOMY / ARTERIOVENOUS GRAFT REVISION  2011   left upper arm  . TUBAL LIGATION  1980's  . UPPER EXTREMITY ANGIOGRAPHY Bilateral 12/06/2016   Procedure: Upper Extremity Angiography;  Surgeon: Katha Cabal, MD;  Location: [place] CV LAB;  Service: Cardiovascular;  Laterality: Bilateral;  . UPPER EXTREMITY INTERVENTION Left 06/06/2017   Procedure: UPPER EXTREMITY INTERVENTION;  Surgeon: Katha Cabal, MD;  Location: [place] CV LAB;  Service: Cardiovascular;  Laterality: Left;    Family History  Problem Relation Age of Onset  . Heart disease Mother        Heart Disease before age 80  . Hyperlipidemia Mother   . Hypertension Mother   . Diabetes Mother   . Heart attack Mother   . Heart disease Father        Heart Disease before age 27  . Hyperlipidemia Father   . Hypertension Father   . Diabetes Father   . Diabetes Sister   . Hypertension Sister   . Diabetes Brother   . Hyperlipidemia Brother   . Heart attack Brother   . Hypertension Sister   . Heart attack Brother   . Other Other        noncontributory for early CAD  . Colon cancer Neg Hx   . Esophageal cancer Neg Hx   . Liver disease Neg Hx   . Kidney disease Neg Hx   . Colon polyps Neg Hx     Social History:  reports that she has never smoked. She has never used smokeless tobacco. She reports that she does not drink alcohol or use drugs.  Allergies:  Allergies  Allergen Reactions  . Aspirin Other (See Comments)    High Doses Mess up her stomach; "makes my bowels have blood in them". Takes 81 mg EC Aspirin   . Penicillins Other (See Comments)    SYNCOPE? , "makes me real weak when I take it; like I'll pass out"  Has patient had a PCN reaction causing immediate rash, facial/tongue/throat swelling, SOB or lightheadedness with hypotension: Yes Has patient had a PCN reaction causing severe rash involving mucus membranes or skin  necrosis: no Has patient had a PCN reaction that required hospitalization no Has patient had a PCN reaction occurring within the last 10 years: no If all of the above  . Amlodipine Swelling  . Bactrim [Sulfamethoxazole-Trimethoprim] Rash  . Contrast Media [Iodinated Diagnostic Agents] Itching  . Iron Itching and Other (See Comments)    "they gave me iron in dialysis; had to give me Benadryl cause I had to have the iron" (05/02/2012)  . Nitrofurantoin Hives  . Tylenol [Acetaminophen] Itching and Other (See Comments)  Makes her feet on fire per pt  . Gabapentin Other (See Comments)    Unknown reaction  . Dexilant [Dexlansoprazole] Other (See Comments)    Upset stomach  . Levaquin [Levofloxacin In D5w] Rash  . Morphine And Related Itching and Other (See Comments)    Itching in feet  . Plavix [Clopidogrel Bisulfate] Rash  . Protonix [Pantoprazole Sodium] Rash  . Venofer [Ferric Oxide] Itching and Other (See Comments)    Patient reports using Benadryl prior to doses as [facility]    Medications: I have reviewed the patient's current medications.   Results for orders placed or performed during the hospital encounter of 12/14/18 (from the past 48 hour(s))  Basic metabolic panel     Status: Abnormal   Collection Time: 12/14/18  1:43 AM  Result Value Ref Range   Sodium 143 135 - 145 mmol/L   Potassium 3.7 3.5 - 5.1 mmol/L   Chloride 98 98 - 111 mmol/L   CO2 29 22 - 32 mmol/L   Glucose, Bld 109 (H) 70 - 99 mg/dL   BUN 37 (H) 8 - 23 mg/dL   Creatinine, Ser 7.62 (H) 0.44 - 1.00 mg/dL   Calcium 11.6 (H) 8.9 - 10.3 mg/dL   GFR calc non Af Amer 5 (L) >60 mL/min   GFR calc Af Amer 5 (L) >60 mL/min   Anion gap 16 (H) 5 - 15    Comment: Performed at [facility], [address], [place], [place] [address]  CBC with Differential     Status: Abnormal   Collection Time: 12/14/18  1:43 AM  Result Value Ref Range   WBC 8.3 4.0 - 10.5 K/uL   RBC 3.15 (L) 3.87 - 5.11 MIL/uL   Hemoglobin  11.0 (L) 12.0 - 15.0 g/dL   HCT 34.6 (L) 36.0 - 46.0 %   MCV 109.8 (H) 80.0 - 100.0 fL   MCH 34.9 (H) 26.0 - 34.0 pg   MCHC 31.8 30.0 - 36.0 g/dL   RDW 15.5 11.5 - 15.5 %   Platelets 153 150 - 400 K/uL   nRBC 0.0 0.0 - 0.2 %   Neutrophils Relative % 73 %   Neutro Abs 6.1 1.7 - 7.7 K/uL   Lymphocytes Relative 13 %   Lymphs Abs 1.1 0.7 - 4.0 K/uL   Monocytes Relative 9 %   Monocytes Absolute 0.7 0.1 - 1.0 K/uL   Eosinophils Relative 4 %   Eosinophils Absolute 0.3 0.0 - 0.5 K/uL   Basophils Relative 1 %   Basophils Absolute 0.1 0.0 - 0.1 K/uL   Immature Granulocytes 0 %   Abs Immature Granulocytes 0.03 0.00 - 0.07 K/uL    Comment: Performed at [facility], [address], [place], [place] [address]  Troponin I - Once     Status: Abnormal   Collection Time: 12/14/18  1:43 AM  Result Value Ref Range   Troponin I 0.07 (HH) <0.03 ng/mL    Comment: CRITICAL RESULT CALLED TO, READ BACK BY AND VERIFIED WITH: J SAPPELT,RN @0314  12/14/18 [facility] Performed at [facility], [address] [place], [place] [address]   Magnesium     Status: None   Collection Time: 12/14/18  1:43 AM  Result Value Ref Range   Magnesium 2.0 1.7 - 2.4 mg/dL    Comment: Performed at [facility], [address] [place], [place] [address]  APTT     Status: None   Collection Time: 12/14/18  1:43 AM  Result Value Ref  Range   aPTT 34 24 - 36 seconds    Comment: Performed at [facility], [address] [place], [place] [address]  Protime-INR     Status: None   Collection Time: 12/14/18  1:43 AM  Result Value Ref Range   Prothrombin Time 13.2 11.4 - 15.2 seconds   INR 1.0 0.8 - 1.2    Comment: (NOTE) INR goal varies based on device and disease states. Performed at [facility], [address], [place], [place] [address]   SARS Coronavirus 2 (CEPHEID - Performed in [facility] hospital lab), Hosp Order     Status: None   Collection Time: 12/14/18  1:45 AM  Result Value Ref Range   SARS Coronavirus 2 NEGATIVE  NEGATIVE    Comment: (NOTE) If result is NEGATIVE SARS-CoV-2 target nucleic acids are NOT DETECTED. The SARS-CoV-2 RNA is generally detectable in upper and lower  respiratory specimens during the acute phase of infection. The lowest  concentration of SARS-CoV-2 viral copies this assay can detect is 250  copies / mL. A negative result does not preclude SARS-CoV-2 infection  and should not be used as the sole basis for treatment or other  patient management decisions.  A negative result may occur with  improper specimen collection / handling, submission of specimen other  than nasopharyngeal swab, presence of viral mutation(s) within the  areas targeted by this assay, and inadequate number of viral copies  (<250 copies / mL). A negative result must be combined with clinical  observations, patient history, and epidemiological information. If result is POSITIVE SARS-CoV-2 target nucleic acids are DETECTED. The SARS-CoV-2 RNA is generally detectable in upper and lower  respiratory specimens dur ing the acute phase of infection.  Positive  results are indicative of active infection with SARS-CoV-2.  Clinical  correlation with patient history and other diagnostic information is  necessary to determine patient infection status.  Positive results do  not rule out bacterial infection or co-infection with other viruses. If result is PRESUMPTIVE POSTIVE SARS-CoV-2 nucleic acids MAY BE PRESENT.   A presumptive positive result was obtained on the submitted specimen  and confirmed on repeat testing.  While 2019 novel coronavirus  (SARS-CoV-2) nucleic acids may be present in the submitted sample  additional confirmatory testing may be necessary for epidemiological  and / or clinical management purposes  to differentiate between  SARS-CoV-2 and other Sarbecovirus currently known to infect humans.  If clinically indicated additional testing with an alternate test  methodology [phone]) is advised. The  SARS-CoV-2 RNA is generally  detectable in upper and lower respiratory sp ecimens during the acute  phase of infection. The expected result is Negative. Fact Sheet for Patients:  StrictlyIdeas.no Fact Sheet for Healthcare Providers: BankingDealers.co.za This test is not yet approved or cleared by the [place] FDA and has been authorized for detection and/or diagnosis of SARS-CoV-2 by FDA under an Emergency Use Authorization (EUA).  This EUA will remain in effect (meaning this test can be used) for the duration of the COVID-19 declaration under Section 564(b)(1) of the Act, 21 U.S.C. section 360bbb-3(b)(1), unless the authorization is terminated or revoked sooner. Performed at [facility], [address] [place], [place] [address]   Troponin I - Once-Timed     Status: Abnormal   Collection Time: 12/14/18  4:59 AM  Result Value Ref Range   Troponin I 0.11 (HH) <0.03 ng/mL    Comment: CRITICAL RESULT CALLED TO, READ BACK BY AND VERIFIED WITH: SAPPELT @ [phone]  ON [id number] BY HENDERSON L. Performed at [facility], [name], [place], [place] [address]   Comprehensive metabolic panel     Status: Abnormal   Collection Time: 12/14/18  4:59 AM  Result Value Ref Range   Sodium 142 135 - 145 mmol/L   Potassium 4.1 3.5 - 5.1 mmol/L   Chloride 101 98 - 111 mmol/L   CO2 27 22 - 32 mmol/L   Glucose, Bld 94 70 - 99 mg/dL   BUN 41 (H) 8 - 23 mg/dL   Creatinine, Ser 7.93 (H) 0.44 - 1.00 mg/dL   Calcium 11.2 (H) 8.9 - 10.3 mg/dL   Total Protein 6.4 (L) 6.5 - 8.1 g/dL   Albumin 3.6 3.5 - 5.0 g/dL   AST 10 (L) 15 - 41 U/L   ALT 10 0 - 44 U/L   Alkaline Phosphatase 125 38 - 126 U/L   Total Bilirubin 0.6 0.3 - 1.2 mg/dL   GFR calc non Af Amer 4 (L) >60 mL/min   GFR calc Af Amer 5 (L) >60 mL/min   Anion gap 14 5 - 15    Comment: Performed at [facility], [address] [place], [place] [address]  CBC     Status: Abnormal   Collection  Time: 12/14/18  4:59 AM  Result Value Ref Range   WBC 8.1 4.0 - 10.5 K/uL   RBC 3.01 (L) 3.87 - 5.11 MIL/uL   Hemoglobin 10.5 (L) 12.0 - 15.0 g/dL   HCT 34.3 (L) 36.0 - 46.0 %   MCV 114.0 (H) 80.0 - 100.0 fL   MCH 34.9 (H) 26.0 - 34.0 pg   MCHC 30.6 30.0 - 36.0 g/dL   RDW 15.7 (H) 11.5 - 15.5 %   Platelets 133 (L) 150 - 400 K/uL   nRBC 0.0 0.0 - 0.2 %    Comment: Performed at [facility], [address], [place], [place] [address]    Dg Chest Port 1 View  Result Date: 12/14/2018 CLINICAL DATA:  Left chest pain EXAM: PORTABLE CHEST 1 VIEW COMPARISON:  10/02/2018 FINDINGS: The heart size is enlarged. The patient is status post prior median sternotomy. There is volume overload with small bilateral pleural effusions. There are emphysematous changes bilaterally. No pneumothorax. No large area of consolidation. There is likely atelectasis versus scarring at the lung bases. IMPRESSION: Cardiomegaly with volume overload and findings suspicious for developing trace to small pleural effusions. Electronically Signed   By: Constance Holster M.D.   On: 12/14/2018 02:21    ROS: as per H&P, other systems reviewed and unremarkable Blood pressure (!) 178/67, pulse 72, temperature 99.1 F (37.3 C), temperature source Oral, resp. rate 20, height 5\' 1"  (1.549 m), weight 59.4 kg, SpO2 100 %. General: Comfortable, not in distress HEENT: PERRLA, moist mucous membrane Respiratory: Bibasal crackles, no wheezing, no increased work of breathing Cardiovascular: Regular rate rhythm, S1-S2 normal, no rubs GI: Soft, nontender, nondistended Extremity: No edema, Skin: No rash or ulcer Neurology: Alert, awake, no asterixis, nonfocal Vascular access: Left upper extremity AV fistula has good thrill and bruit.  Assessment/Plan: 1 Chest pain/unstable angina: Currently on IV heparin and nitroglycerin. Has some SOB, in room air. Cardiology consulted. Cath reported noted.    2 ESRD MWF at [place]: Plan for dialysis today.   3K, goal UF around 2 to 3 L, on systemic IV heparin.  She has AV fistula for the access.  We will try to obtain records from her outpatient center.  3 Hypertension: Blood  pressure elevated.  Resume home medication.  Dialysis today.  4. Anemia of ESRD: Hemoglobin at goal.  5. Metabolic Bone Disease: On Sensipar and Renvela.  Calcium elevated.  Check phosphorus level.   Tanna Furry 12/14/2018, 8:37 AM

## 2018-12-14 NOTE — ED Notes (Signed)
Hospitalist at Pts Bedside

## 2018-12-14 NOTE — Progress Notes (Signed)
Jeannette Corpus, NP notified of heart rate 142. Blood pressure 175/78.

## 2018-12-14 NOTE — ED Notes (Signed)
Date and time results received: 12/14/18 0314   Test: Troponin Critical Value: 0.07  Name of Provider Notified: Roxanne Mins, MD

## 2018-12-14 NOTE — ED Triage Notes (Signed)
Pt from home, states left and center chest pain started approx 5 pm, states pain has been intermittent.  Pt took 2 of her own nitro for the pain, unable to state whether it has helped.

## 2018-12-14 NOTE — ED Notes (Signed)
[facility] at bedside

## 2018-12-14 NOTE — Consult Note (Addendum)
Cardiology Consult    Patient ID: LEAR CARSTENS; [id number]; Sep 26, 1940   Admit date: 12/14/2018 Date of Consult: 12/14/2018  Primary Care Provider: [name], MD Primary Cardiologist: Kate Sable, MD    Patient Profile    Shawna [name] is a 78 y.o. female with past medical history of CAD (s/p CABG in 2013 with most recent DES to D1 in 10/2016 with cath showing patent LIMA-LAD, SVG-RI, SVG-Mrg and SVG-RCA, NST in 02/2018 showing scar with no significant ischemia, cath in 07/2018 showing grafts with occlusion of D1 at prior stent site and progression of PDA disease --> medical management recommended), HTN, HLD, PAF (not on anticoagulation given history of GIB), NSVT (on Amiodarone) and ESRD who is being seen today for the evaluation of unstable angina at the request of Dr. Maudie Mercury.   History of Present Illness    Shawna [name] was most recently admitted to [facility] in 07/2018 for evaluation of chest pain and EKG showed new ST depression along the inferior and lateral leads, therefore she was transferred to [facility] for a cardiac catheterization. This showed a patent LIMA-LAD, SVG-RI, SVG-OM, and SVG-dRCA. Compared to prior caths, the diagonal was occluded and there was progression of disease in the PDA. Medical management was recommended and if she had refractory angina despite good BP and volume management, could consider PCI of the PDA but she had not responded well to stenting in the past with all prior stents occluded.  She most recently had a telehealth visit with Pecolia Ades, PA-C in 10/2018 and reported doing well. She reported still having issues with "hypotension" and said she was having syncopal episodes with SBP in the 120's. Was not taking Hydralazine. Was still having intermittent episodes of chest pain nd was continued on BB, Imdur, statin, and Brilinta. Imdur was not further titrated given variable BP.    She reports being in her usual state of health until the past two days  when she has experienced intermittent episodes of chest pain. Describes this as an aching sensation along her left pectoral region which has radiated into her shoulder blade. Her episodes have occurred at rest and have been relieved with SL NTGx1 with each episode. She reports some dyspnea on exertion but also mentions her dialysis session ended early on Wednesday due to hypotension. Denies any recent nausea, vomiting, fever, or chills. No recent orthopnea, PND, or edema.   Initial labs show WBC 8.3, Hgb 11.0, platelets 153, Na+ 143, K+ 3.7, and creatinine 7.62. Mg 2.0.COVID testing negative. Initial troponin 0.07 with repeat value of 0.11. Cyclic enzymes pending. CXR shows cardiomegaly with small pleural effusions. EKG shows NSR, HR 87, with IVCD and LVH with repol abnormalities. ST depression improved when compared to prior tracing from 07/13/2018.  She is currently on IV NTG (at 80 mcg/min) and IV Heparin. Pain free at this time.  Past Medical History:  Diagnosis Date  . Acute on chronic respiratory failure with hypoxia ([place]) 10/10/2016  . Anxiety   . Arthritis   . AVM (arteriovenous malformation) of colon   . CAD (coronary artery disease)    a. s/p CABG in 2013 b. most recent DES to D1 in 10/2016 with cath showing patent LIMA-LAD, SVG-RI, SVG-Mrg and SVG-RCA  c. NST in 02/2018 showing scar with no significant ischemia  . Carotid artery disease ([place])    a. 41-93% LICA, 01/9023   . Chronic bronchitis ([place])   . Chronic diastolic CHF (congestive heart failure) ([place])  a. 02/2012 Echo EF 60-65%, nl wall motion, Gr 1 DD, mod MR  . Colon cancer ([place]) 1992  . Esophageal stricture   . ESRD on hemodialysis ([place])    ESRD due to HTN, started dialysis 2011 and gets HD at [facility] with Dr Hinda Lenis on MWF schedule.  Access is LUA AVF as of Sept 2014.   Marland Kitchen GERD (gastroesophageal reflux disease)   . High cholesterol 12/2011  . History of blood transfusion 07/2011; 12/2011; 01/2012 X 2; 04/2012  . History of  gout   . History of lower GI bleeding   . Hypertension   . Iron deficiency anemia   . Mitral regurgitation    a. Moderate by echo, 02/2012  . Myocardial infarction ([place])   . Ovarian cancer ([place]) 1992  . Pneumonia ~ 2009  . PUD (peptic ulcer disease)   . TIA (transient ischemic attack)     Past Surgical History:  Procedure Laterality Date  . ABDOMINAL HYSTERECTOMY  1992  . APPENDECTOMY  06/1990  . AV FISTULA PLACEMENT  07/2009   left upper arm  . AV FISTULA PLACEMENT Right 09/06/2016   Procedure: RIGHT FOREARM ARTERIOVENOUS (AV) GRAFT;  Surgeon: Elam Dutch, MD;  Location[facility] OR;  Service: Vascular;  Laterality: Right;  . AV FISTULA PLACEMENT N/A 02/24/2017   Procedure: INSERTION OF ARTERIOVENOUS (AV) GORE-TEX GRAFT ARM (BRACHIAL AXILLARY);  Surgeon: Katha Cabal, MD;  Location: [facility] ORS;  Service: Vascular;  Laterality: N/A;  . [place] Right 09/06/2016   Procedure: REMOVAL OF Right Arm ARTERIOVENOUS GORETEX GRAFT and Vein Patch angioplasty of brachial artery;  Surgeon: Angelia Mould, MD;  Location: [place];  Service: Vascular;  Laterality: Right;  . COLON RESECTION  1992  . COLON SURGERY    . CORONARY ANGIOPLASTY WITH STENT PLACEMENT  12/15/11   "2"  . CORONARY ANGIOPLASTY WITH STENT PLACEMENT  y/2013   "1; makes total of 3" (05/02/2012)  . CORONARY ARTERY BYPASS GRAFT  06/13/2012   Procedure: CORONARY ARTERY BYPASS GRAFTING (CABG);  Surgeon: Grace Isaac, MD;  Location: [place];  Service: Open Heart Surgery;  Laterality: N/A;  cabg x four;  using left internal mammary artery, and left leg greater saphenous vein harvested endoscopically  . CORONARY STENT INTERVENTION N/A 10/13/2016   Procedure: Coronary Stent Intervention;  Surgeon: Troy Sine, MD;  Location: [place] CV LAB;  Service: Cardiovascular;  Laterality: N/A;  . DIALYSIS/PERMA CATHETER REMOVAL N/A 04/18/2017   Procedure: DIALYSIS/PERMA CATHETER REMOVAL;  Surgeon: Katha Cabal, MD;  Location:  [place] CV LAB;  Service: Cardiovascular;  Laterality: N/A;  . DILATION AND CURETTAGE OF UTERUS    . ESOPHAGOGASTRODUODENOSCOPY  01/20/2012   Procedure: ESOPHAGOGASTRODUODENOSCOPY (EGD);  Surgeon: Ladene Artist, MD,FACG;  Location: [facility] ENDOSCOPY;  Service: Endoscopy;  Laterality: N/A;  . ESOPHAGOGASTRODUODENOSCOPY N/A 03/26/2013   Procedure: ESOPHAGOGASTRODUODENOSCOPY (EGD);  Surgeon: Irene Shipper, MD;  Location: [facility] ENDOSCOPY;  Service: Endoscopy;  Laterality: N/A;  . ESOPHAGOGASTRODUODENOSCOPY N/A 04/30/2015   Procedure: ESOPHAGOGASTRODUODENOSCOPY (EGD);  Surgeon: Rogene Houston, MD;  Location: [facility] ENDO SUITE;  Service: Endoscopy;  Laterality: N/A;  1pm - moved to 10/20 @ 1:10  . ESOPHAGOGASTRODUODENOSCOPY N/A 07/29/2016   Procedure: ESOPHAGOGASTRODUODENOSCOPY (EGD);  Surgeon: Manus Gunning, MD;  Location: [place];  Service: Gastroenterology;  Laterality: N/A;  enteroscopy  . INTRAOPERATIVE TRANSESOPHAGEAL ECHOCARDIOGRAM  06/13/2012   Procedure: INTRAOPERATIVE TRANSESOPHAGEAL ECHOCARDIOGRAM;  Surgeon: Grace Isaac, MD;  Location: [place];  Service: Open Heart Surgery;  Laterality: N/A;  . IR GENERIC HISTORICAL  07/26/2016   IR FLUORO GUIDE CV LINE RIGHT 07/26/2016 Greggory Keen, MD [facility]-INTER[facility] RAD  . IR GENERIC HISTORICAL  07/26/2016   IR US GUIDE VASC ACCESS RIGHT 07/26/2016 Greggory Keen, MD [facility]  . IR GENERIC HISTORICAL  08/02/2016   IR US GUIDE VASC ACCESS RIGHT 08/02/2016 Greggory Keen, MD [facility]  . IR GENERIC HISTORICAL  08/02/2016   IR FLUORO GUIDE CV LINE RIGHT 08/02/2016 Greggory Keen, MD [facility]  . IR RADIOLOGY PERIPHERAL GUIDED IV START  03/28/2017  . IR US GUIDE VASC ACCESS RIGHT  03/28/2017  . LEFT HEART CATH AND CORONARY ANGIOGRAPHY N/A 09/20/2016   Procedure: Left Heart Cath and Coronary Angiography;  Surgeon: [name], MD;  Location: [place] CV LAB;  Service: Cardiovascular;  Laterality: N/A;  . LEFT HEART CATH AND CORS/GRAFTS ANGIOGRAPHY  N/A 10/13/2016   Procedure: Left Heart Cath and Cors/Grafts Angiography;  Surgeon: Troy Sine, MD;  Location: [place] CV LAB;  Service: Cardiovascular;  Laterality: N/A;  . LEFT HEART CATH AND CORS/GRAFTS ANGIOGRAPHY N/A 07/13/2018   Procedure: LEFT HEART CATH AND CORS/GRAFTS ANGIOGRAPHY;  Surgeon: Martinique, Peter M, MD;  Location: [place] CV LAB;  Service: Cardiovascular;  Laterality: N/A;  . LEFT HEART CATHETERIZATION WITH CORONARY ANGIOGRAM N/A 12/15/2011   Procedure: LEFT HEART CATHETERIZATION WITH CORONARY ANGIOGRAM;  Surgeon: Burnell Blanks, MD;  Location: [facility] CATH LAB;  Service: Cardiovascular;  Laterality: N/A;  . LEFT HEART CATHETERIZATION WITH CORONARY ANGIOGRAM N/A 01/10/2012   Procedure: LEFT HEART CATHETERIZATION WITH CORONARY ANGIOGRAM;  Surgeon: Peter M Martinique, MD;  Location: [facility] CATH LAB;  Service: Cardiovascular;  Laterality: N/A;  . LEFT HEART CATHETERIZATION WITH CORONARY ANGIOGRAM N/A 06/08/2012   Procedure: LEFT HEART CATHETERIZATION WITH CORONARY ANGIOGRAM;  Surgeon: Burnell Blanks, MD;  Location: [facility] CATH LAB;  Service: Cardiovascular;  Laterality: N/A;  . LEFT HEART CATHETERIZATION WITH CORONARY/GRAFT ANGIOGRAM N/A 12/10/2013   Procedure: LEFT HEART CATHETERIZATION WITH Beatrix Fetters;  Surgeon: [name], MD;  Location: [facility] CATH LAB;  Service: Cardiovascular;  Laterality: N/A;  . OVARY SURGERY     ovarian cancer  . REVISION OF ARTERIOVENOUS GORETEX GRAFT N/A 02/24/2017   Procedure: REVISION OF ARTERIOVENOUS GORETEX GRAFT (RESECTION);  Surgeon: Katha Cabal, MD;  Location: [facility] ORS;  Service: Vascular;  Laterality: N/A;  Earney Mallet N/A 10/15/2013   Procedure: Fistulogram;  Surgeon: Serafina Mitchell, MD;  Location: [facility] CATH LAB;  Service: Cardiovascular;  Laterality: N/A;  . THROMBECTOMY / ARTERIOVENOUS GRAFT REVISION  2011   left upper arm  . TUBAL LIGATION  1980's  . UPPER EXTREMITY ANGIOGRAPHY Bilateral 12/06/2016   Procedure: Upper  Extremity Angiography;  Surgeon: Katha Cabal, MD;  Location: [place] CV LAB;  Service: Cardiovascular;  Laterality: Bilateral;  . UPPER EXTREMITY INTERVENTION Left 06/06/2017   Procedure: UPPER EXTREMITY INTERVENTION;  Surgeon: Katha Cabal, MD;  Location: [place] CV LAB;  Service: Cardiovascular;  Laterality: Left;     Home Medications:  Prior to Admission medications   Medication Sig Start Date End Date Taking? Authorizing Provider  ALPRAZolam Duanne Moron) 0.5 MG tablet Take 0.5 mg by mouth daily. 10/16/18   [provider]  amiodarone (PACERONE) 200 MG tablet Take 1 tablet (200 mg total) by mouth daily. 07/14/18   Hosie Poisson, MD  bisacodyl (DULCOLAX) 10 MG suppository Place 1 suppository (10 mg total) rectally as needed for moderate constipation. 11/21/17   Rehman, Mechele Dawley,  MD  carvedilol (COREG) 6.25 MG tablet Take 6.25 mg by mouth 2 (two) times daily with a meal.    [provider]  cinacalcet (SENSIPAR) 30 MG tablet Take 30 mg by mouth daily after supper.    [provider]  diphenhydrAMINE (BENADRYL) 25 mg capsule Take 50 mg by mouth daily as needed for itching or allergies.     [provider]  diphenoxylate-atropine (LOMOTIL) 2.5-0.025 MG tablet Take 1 tablet by mouth 4 (four) times daily as needed for diarrhea or loose stools. 08/13/17   Tanna Furry, MD  epoetin alfa (EPOGEN,PROCRIT) 68115 UNIT/ML injection Inject 1 mL (10,000 Units total) into the vein every Monday, Wednesday, and Friday with hemodialysis. 04/11/16   Orvan Falconer, MD  ferrous sulfate 325 (65 FE) MG EC tablet Take 1 tablet (325 mg total) by mouth 2 (two) times daily with a meal. Patient taking differently: Take 325 mg by mouth daily with breakfast.  07/15/17 10/18/18  Johnson, Clanford L, MD  fluticasone (FLONASE) 50 MCG/ACT nasal spray Place 1 spray at bedtime as needed into both nostrils for allergies.     [provider]  isosorbide mononitrate (IMDUR) 120 MG 24 hr  tablet Take 1 tablet by mouth once daily 09/21/18   Herminio Commons, MD  lidocaine-prilocaine (EMLA) cream Apply 1 application topically every Monday, Wednesday, and Friday. Prior to dialysis    [provider]  loratadine (CLARITIN) 10 MG tablet Take 1 tablet (10 mg total) by mouth daily as needed for allergies. 07/14/18   Hosie Poisson, MD  multivitamin (RENA-VIT) TABS tablet Take 1 tablet by mouth daily.    [provider]  nitroGLYCERIN (NITROSTAT) 0.4 MG SL tablet DISSOLVE ONE TABLET UNDER THE TONGUE EVERY 5 MINUTES AS NEEDED FOR CHEST PAIN.  DO NOT EXCEED A TOTAL OF 3 DOSES IN 15 MINUTES 10/08/18   Herminio Commons, MD  omeprazole (PRILOSEC) 20 MG capsule Take 1 capsule (20 mg total) by mouth daily. 11/21/17   Rehman, Mechele Dawley, MD  ondansetron (ZOFRAN-ODT) 4 MG disintegrating tablet Take 4 mg by mouth every 8 (eight) hours as needed for nausea or vomiting.  07/12/17   [provider]  sevelamer carbonate (RENVELA) 800 MG tablet Take 800-2,400 mg by mouth See admin instructions. Take 3 tablets (2400) by mouth twice daily with meals, take 1 tablet (800 mg) with snacks    [provider]  simvastatin (ZOCOR) 20 MG tablet TAKE ONE TABLET (20 MG) BY MOUTH DAILY AT BEDTIME 04/03/18   Herminio Commons, MD  ticagrelor (BRILINTA) 60 MG TABS tablet Take 1 tablet (60 mg total) by mouth 2 (two) times daily. 08/17/18   Almyra Deforest, PA    Inpatient Medications: Scheduled Meds: . Chlorhexidine Gluconate Cloth  6 each Topical Q0600  . sodium chloride flush  3 mL Intravenous Q12H   Continuous Infusions: . sodium chloride    . heparin 700 Units/hr (12/14/18 0211)  . nitroGLYCERIN 80 mcg/min (12/14/18 0846)   PRN Meds: sodium chloride, diphenhydrAMINE-zinc acetate, ondansetron (ZOFRAN) IV, ondansetron, sodium chloride flush  Allergies:    Allergies  Allergen Reactions  . Aspirin Other (See Comments)    High Doses Mess up her stomach; "makes my bowels have blood in  them". Takes 81 mg EC Aspirin   . Penicillins Other (See Comments)    SYNCOPE? , "makes me real weak when I take it; like I'll pass out"  Has patient had a PCN reaction causing immediate rash, facial/tongue/throat swelling, SOB or  lightheadedness with hypotension: Yes Has patient had a PCN reaction causing severe rash involving mucus membranes or skin necrosis: no Has patient had a PCN reaction that required hospitalization no Has patient had a PCN reaction occurring within the last 10 years: no If all of the above  . Amlodipine Swelling  . Bactrim [Sulfamethoxazole-Trimethoprim] Rash  . Contrast Media [Iodinated Diagnostic Agents] Itching  . Iron Itching and Other (See Comments)    "they gave me iron in dialysis; had to give me Benadryl cause I had to have the iron" (05/02/2012)  . Nitrofurantoin Hives  . Tylenol [Acetaminophen] Itching and Other (See Comments)    Makes her feet on fire per pt  . Gabapentin Other (See Comments)    Unknown reaction  . Dexilant [Dexlansoprazole] Other (See Comments)    Upset stomach  . Levaquin [Levofloxacin In D5w] Rash  . Morphine And Related Itching and Other (See Comments)    Itching in feet  . Plavix [Clopidogrel Bisulfate] Rash  . Protonix [Pantoprazole Sodium] Rash  . Venofer [Ferric Oxide] Itching and Other (See Comments)    Patient reports using Benadryl prior to doses as [place] History:   Social History   Socioeconomic History  . Marital status: Married    Spouse name: Not on file  . Number of children: Not on file  . Years of education: Not on file  . Highest education level: Not on file  Occupational History  . Not on file  Social Needs  . Financial resource strain: Not on file  . Food insecurity:    Worry: Not on file    Inability: Not on file  . Transportation needs:    Medical: Not on file    Non-medical: Not on file  Tobacco Use  . Smoking status: Never Smoker  . Smokeless tobacco: Never Used   Substance and Sexual Activity  . Alcohol use: No    Alcohol/week: 0.0 standard drinks  . Drug use: No  . Sexual activity: Yes    Birth control/protection: Surgical  Lifestyle  . Physical activity:    Days per week: Not on file    Minutes per session: Not on file  . Stress: Not on file  Relationships  . Social connections:    Talks on phone: Not on file    Gets together: Not on file    Attends religious service: Not on file    Active member of club or organization: Not on file    Attends meetings of clubs or organizations: Not on file    Relationship status: Not on file  . Intimate partner violence:    Fear of current or ex partner: Not on file    Emotionally abused: Not on file    Physically abused: Not on file    Forced sexual activity: Not on file  Other Topics Concern  . Not on file  Social History Narrative   Lives in [place], [place] with husband.  Dialysis pt - [facility].     Family History:    Family History  Problem Relation Age of Onset  . Heart disease Mother        Heart Disease before age 1  . Hyperlipidemia Mother   . Hypertension Mother   . Diabetes Mother   . Heart attack Mother   . Heart disease Father        Heart Disease before age 5  . Hyperlipidemia Father   . Hypertension Father   .  Diabetes Father   . Diabetes Sister   . Hypertension Sister   . Diabetes Brother   . Hyperlipidemia Brother   . Heart attack Brother   . Hypertension Sister   . Heart attack Brother   . Other Other        noncontributory for early CAD  . Colon cancer Neg Hx   . Esophageal cancer Neg Hx   . Liver disease Neg Hx   . Kidney disease Neg Hx   . Colon polyps Neg Hx       Review of Systems    General:  No chills, fever, night sweats or weight changes.  Cardiovascular:  No edema, orthopnea, palpitations, paroxysmal nocturnal dyspnea. Positive for chest pain and dyspnea on exertion.  Dermatological: No rash, lesions/masses Respiratory: No cough, dyspnea Urologic: No  hematuria, dysuria Abdominal:   No nausea, vomiting, diarrhea, bright red blood per rectum, melena, or hematemesis Neurologic:  No visual changes, wkns, changes in mental status. All other systems reviewed and are otherwise negative except as noted above.  Physical Exam/Data    Vitals:   12/14/18 0730 12/14/18 0745 12/14/18 0800 12/14/18 0815  BP: (!) 178/71 (!) 177/69 (!) 173/72 (!) 178/67  Pulse:      Resp: 13 (!) 21 15 20   Temp:      TempSrc:      SpO2:      Weight:      Height:       No intake or output data in the 24 hours ending 12/14/18 0910 Filed Weights   12/14/18 0123  Weight: 59.4 kg   Body mass index is 24.75 kg/m.   General: Pleasant, elderly African American female appearing in NAD Psych: Normal affect. Neuro: Alert and oriented X 3. Moves all extremities spontaneously. HEENT: Normal  Neck: Supple without bruits or JVD. Lungs:  Resp regular and unlabored, decreased along bases bilaterally. Heart: RRR no s3, s4, or murmurs. Abdomen: Soft, non-tender, non-distended, BS + x 4.  Extremities: No clubbing or cyanosis. Trace edema. DP/PT/Radials 2+ and equal bilaterally.   EKG:  The EKG was personally reviewed and demonstrates:  NSR, HR 87, with IVCD and LVH with repol abnormalities. ST depression improved when compared to prior tracing from 07/13/2018.  Telemetry:  Telemetry was personally reviewed and demonstrates: NSR, HR in 70's to 110's with occasional PVC's.    Labs/Studies     Relevant CV Studies:  Echocardiogram: 02/2018 Study Conclusions  - Left ventricle: The cavity size was normal. Wall thickness was   increased in a pattern of mild LVH. Systolic function was normal.   The estimated ejection fraction was in the range of 55% to 60%.   Wall motion was normal; there were no regional wall motion   abnormalities. Features are consistent with a pseudonormal left   ventricular filling pattern, with concomitant abnormal relaxation   and increased  filling pressure (grade 2 diastolic dysfunction).   Doppler parameters are consistent with high ventricular filling   pressure. - Aortic valve: Mildly to moderately calcified annulus. Trileaflet;   moderately thickened leaflets. Valve area (VTI): 1.88 cm^2. Valve   area (Vmax): 1.68 cm^2. - Mitral valve: Mildly to moderately calcified annulus. Mildly   thickened leaflets . There was mild regurgitation. - Left atrium: The atrium was severely dilated. - Right ventricle: The cavity size was mildly dilated. Systolic   function was mildly reduced. - Right atrium: The atrium was severely dilated. - Atrial septum: No defect or patent foramen ovale was identified. -  Tricuspid valve: There was moderate regurgitation. - Pulmonary arteries: Systolic pressure was moderately increased.   PA peak pressure: 56 mm Hg (S). - Technically adequate study.  Cardiac Catheterization: 07/2018  Ost RPDA to RPDA lesion is 75% stenosed.  Mid LM to Dist LM lesion is 60% stenosed.  Ost Ramus to Ramus lesion is 80% stenosed.  Ost Cx to Mid Cx lesion is 99% stenosed.  Prox LAD lesion is 90% stenosed.  Ost 1st Diag to 1st Diag lesion is 100% stenosed.  Ost RCA to Prox RCA lesion is 100% stenosed.  LIMA graft was visualized by angiography and is large.  The graft exhibits no disease.  SVG graft was visualized by angiography and is large.  The graft exhibits no disease.  SVG graft was visualized by angiography and is normal in caliber.  SVG graft was visualized by angiography and is large.  The graft exhibits no disease.  The left ventricular systolic function is normal.  LV end diastolic pressure is mildly elevated.  The left ventricular ejection fraction is 55-65% by visual estimate.   1. Severe 3 vessel occlusive CAD    - 60% distal left main    - 90% mid LAD    - 100% first diagonal at prior stent site    - 99% proximal LCx at prior stent site    - 80% ramus intermediate.    - 100%  ostial RCA at prior stent site    - 75% proximal PDA 2. Patent LIMA to the LAD 3. Patent SVG to ramus intermediate 4. Patent SVG to OM 5. Patent SVG to the distal RCA. 6. Good LV function 7. Mildly elevated LVEDP  Plan: Compared to prior cath in April 2018 the diagonal is now occluded. There is progression of disease in the PDA. It is unclear if this is the reason for her chest pain. She is severely hypertensive and EDP is mildly elevated. I would recommend medical management with good BP control. If she has refractory angina despite good BP and volume management we could consider PCI of the PDA but this would have to be done through the SVG and in general she has not responded well to stenting in the past with all prior stents occluded. I will increase her Coreg dose. She will need dialysis in am.    Laboratory Data:  Chemistry Recent Labs  Lab 12/14/18 0143 12/14/18 0459  NA 143 142  K 3.7 4.1  CL 98 101  CO2 29 27  GLUCOSE 109* 94  BUN 37* 41*  CREATININE 7.62* 7.93*  CALCIUM 11.6* 11.2*  GFRNONAA 5* 4*  GFRAA 5* 5*  ANIONGAP 16* 14    Recent Labs  Lab 12/14/18 0459  PROT 6.4*  ALBUMIN 3.6  AST 10*  ALT 10  ALKPHOS 125  BILITOT 0.6   Hematology Recent Labs  Lab 12/14/18 0143 12/14/18 0459  WBC 8.3 8.1  RBC 3.15* 3.01*  HGB 11.0* 10.5*  HCT 34.6* 34.3*  MCV 109.8* 114.0*  MCH 34.9* 34.9*  MCHC 31.8 30.6  RDW 15.5 15.7*  PLT 153 133*   Cardiac Enzymes Recent Labs  Lab 12/14/18 0143 12/14/18 0459  TROPONINI 0.07* 0.11*   No results for input(s): TROPIPOC in the last 168 hours.  BNPNo results for input(s): BNP, PROBNP in the last 168 hours.  DDimer No results for input(s): DDIMER in the last 168 hours.  Radiology/Studies:  Dg Chest Port 1 View  Result Date: 12/14/2018 CLINICAL DATA:  Left chest pain  EXAM: PORTABLE CHEST 1 VIEW COMPARISON:  10/02/2018 FINDINGS: The heart size is enlarged. The patient is status post prior median sternotomy. There is  volume overload with small bilateral pleural effusions. There are emphysematous changes bilaterally. No pneumothorax. No large area of consolidation. There is likely atelectasis versus scarring at the lung bases. IMPRESSION: Cardiomegaly with volume overload and findings suspicious for developing trace to small pleural effusions. Electronically Signed   By: Constance Holster M.D.   On: 12/14/2018 02:21     Assessment & Plan    1. Chest Pain in the setting of known CAD - she is s/p CABG in 2013 with DES to D1 in 10/2016 with cath showing patent LIMA-LAD, SVG-RI, SVG-Mrg and SVG-RCA and repeat cath in 07/2018 showing grafts with occlusion of D1 at prior stent site and progression of PDA disease --> medical management recommended.  - she presents with 3 episodes of chest pain over the past two days which have occurred at rest and have been relieved with SL NTGx1. Of note, she reports her HD session on Wednesday was stopped early due to her hypotension and symptoms started after this.  - Initial troponin 0.07 with repeat value of 0.11. Cyclic enzymes pending. EKG shows NSR, HR 87, with IVCD and LVH with repol abnormalities. ST depression improved when compared to prior tracing from 07/13/2018. - she is now on IV NTG (at 80 mcg/min) and IV Heparin. Would continue to wean IV NTG as symptoms and BP allow as she is currently pain-free. Continue to cycle enzymes. Repeat echo is pending. Would suspect medical management to be pursued if enzymes remain flat and echo without significant changes. Continue Brilinta (intolerant to ASA and Plavix), BB, and statin. She was on Imdur 120mg  daily as an outpatient. May need to consider adjusting to BID dosing but would need to hold the morning of HD sessions.     2. Paroxysmal Atrial Fibrillation/NSVT - currently in NSR. Has been on Amiodarone 200mg  daily due to episodes of NSVT. Continue to follow on telemetry. She has not been on anticoagulation given history of GIB.    3. Accelerated HTN - her medication regimen as an outpatient has been variable as she self-discontinued Hydralazine due to SBP being in the 120's during HD which led to her having dizziness. Reports compliance with Coreg 6.25mg  BID and Imdur 120mg  once daily. She received IV Hydralazine earlier today and is currently on NTG drip. Will plan to wean NTG drip as symptoms and BP allow as she is now CP free.   4. HLD - she has been intolerant to high-intensity statins and remains on Simvastatin 20mg  daily. LDL at 26 whren checked in 10/2017.  5. ESRD - on HD - MWF schedule. Nephrology has been consulted by the admitting team to arrange HD.   For questions or updates, please contact [place] Please consult [email or web address] for contact info under Cardiology/STEMI.  Signed, Erma Heritage, PA-C 12/14/2018, 9:10 AM Pager: [phone]  The patient was seen and examined, and I agree with the history, physical exam, assessment and plan as documented above, with modifications as noted below. I have also personally reviewed all relevant documentation, old records, labs, and both radiographic and cardiovascular studies. I have also independently interpreted old and new ECG's.  Briefly, this is a 78 year old woman who is well-known to me with a forementioned cardiac history and cardiac catheterization earlier this year demonstrating patent bypass grafts.  There was progression of disease in the PDA and while PCI could  be considered, medical management was recommended.  She had some chest pain yesterday while at rest and took nitroglycerin with relief.  It then recurred and she took another nitroglycerin and again had relief.  She then went to bed and then experienced pain between her shoulder blades that radiated to her chest.  She took another nitroglycerin with relief but told her husband she should go to the ED.  Labs reviewed above with troponin of 0.07 and 0.11.  ECG showing some improvement  from prior ECG in January.  Currently on IV heparin and IV nitroglycerin at 80 mcg/min.  She is now chest pain-free.  She has not been started on her outpatient medication regimen.  Because she felt that Imdur was leading to hair loss and weight loss, she reduced the dose on her own to 60 mg daily.  I recommend continued medical management if cardiac enzymes remain flat.  IV nitroglycerin should be weaned.  I convinced her to take Imdur 60 mg twice daily.  I will review the echocardiogram which is pending. Otherwise, continue Brilinta, carvedilol, and statin.  Carvedilol can be increased as needed for both blood pressure control and antianginal benefit.   Kate Sable, MD, [facility]  12/14/2018 11:07 AM

## 2018-12-14 NOTE — Progress Notes (Signed)
[facility] Spoke with Carolin Sicks to arrange dialysis for today.

## 2018-12-14 NOTE — Progress Notes (Signed)
ANTICOAGULATION CONSULT NOTE - Preliminary  Pharmacy Consult for heparin Indication: chest pain/ACS  Allergies  Allergen Reactions  . Aspirin Other (See Comments)    High Doses Mess up her stomach; "makes my bowels have blood in them". Takes 81 mg EC Aspirin   . Penicillins Other (See Comments)    SYNCOPE? , "makes me real weak when I take it; like I'll pass out"  Has patient had a PCN reaction causing immediate rash, facial/tongue/throat swelling, SOB or lightheadedness with hypotension: Yes Has patient had a PCN reaction causing severe rash involving mucus membranes or skin necrosis: no Has patient had a PCN reaction that required hospitalization no Has patient had a PCN reaction occurring within the last 10 years: no If all of the above  . Amlodipine Swelling  . Bactrim [Sulfamethoxazole-Trimethoprim] Rash  . Contrast Media [Iodinated Diagnostic Agents] Itching  . Iron Itching and Other (See Comments)    "they gave me iron in dialysis; had to give me Benadryl cause I had to have the iron" (05/02/2012)  . Nitrofurantoin Hives  . Tylenol [Acetaminophen] Itching and Other (See Comments)    Makes her feet on fire per pt  . Gabapentin Other (See Comments)    Unknown reaction  . Dexilant [Dexlansoprazole] Other (See Comments)    Upset stomach  . Levaquin [Levofloxacin In D5w] Rash  . Morphine And Related Itching and Other (See Comments)    Itching in feet  . Plavix [Clopidogrel Bisulfate] Rash  . Protonix [Pantoprazole Sodium] Rash  . Venofer [Ferric Oxide] Itching and Other (See Comments)    Patient reports using Benadryl prior to doses as [place]    Patient Measurements: Height: 5\' 1"  (154.9 cm) Weight: 131 lb (59.4 kg) IBW/kg (Calculated) : 47.8 HEPARIN DW (KG): 59.4   Vital Signs: Temp: 99.1 F (37.3 C) (06/05 0127) Temp Source: Oral (06/05 0127) BP: 230/88 (06/05 0127) Pulse Rate: 104 (06/05 0127)  Labs: Recent Labs    12/14/18 0143  HGB 11.0*  HCT  34.6*  PLT 153   CrCl cannot be calculated (Patient's most recent lab result is older than the maximum 21 days allowed.).  Medical History: Past Medical History:  Diagnosis Date  . Acute on chronic respiratory failure with hypoxia ([place]) 10/10/2016  . Anxiety   . Arthritis   . AVM (arteriovenous malformation) of colon   . CAD (coronary artery disease)    a. s/p CABG in 2013 b. most recent DES to D1 in 10/2016 with cath showing patent LIMA-LAD, SVG-RI, SVG-Mrg and SVG-RCA  c. NST in 02/2018 showing scar with no significant ischemia  . Carotid artery disease ([place])    a. 34-19% LICA, 09/7900   . Chronic bronchitis ([place])   . Chronic diastolic CHF (congestive heart failure) ([place])    a. 02/2012 Echo EF 60-65%, nl wall motion, Gr 1 DD, mod MR  . Colon cancer ([place]) 1992  . Esophageal stricture   . ESRD on hemodialysis ([place])    ESRD due to HTN, started dialysis 2011 and gets HD at [facility] with Dr Hinda Lenis on MWF schedule.  Access is LUA AVF as of Sept 2014.   Marland Kitchen GERD (gastroesophageal reflux disease)   . High cholesterol 12/2011  . History of blood transfusion 07/2011; 12/2011; 01/2012 X 2; 04/2012  . History of gout   . History of lower GI bleeding   . Hypertension   . Iron deficiency anemia   . Mitral regurgitation    a. Moderate by echo,  02/2012  . Myocardial infarction ([place])   . Ovarian cancer ([place]) 1992  . Pneumonia ~ 2009  . PUD (peptic ulcer disease)   . TIA (transient ischemic attack)     Medications:  (Not in a hospital admission)   Assessment: 78 yo presented with chest pain.  Hx CAD, most recent cardiac cath in January 2020.  Pharmacy asked to start heparin gtt.  Baseline labs ordered. Not on anticoagulants at home.   Goal of Therapy:  Heparin level 0.3-0.7 units/ml   Plan:  Give 3500 units bolus x 1 Start heparin infusion at 700 units/hr Check anti-Xa level in 6 hours and daily while on heparin Continue to monitor H&H and platelets  Preliminary review of pertinent  patient information completed.  Forestine Na clinical pharmacist will complete review during morning rounds to assess the patient and finalize treatment regimen.  Nyra Capes, [place] 12/14/2018,2:02 AM

## 2018-12-14 NOTE — ED Provider Notes (Signed)
[facility] EMERGENCY DEPARTMENT Provider Note   CSN: [id number] Arrival date & time: 12/14/18  0115    History   Chief Complaint Chief Complaint  Patient presents with   Chest Pain    HPI Shawna MCGUIRT is a 78 y.o. female.   The history is provided by the patient.  She has history of hypertension, hyperlipidemia, end-stage renal disease on hemodialysis, paroxysmal atrial fibrillation, diastolic heart failure, COPD and comes in with upper chest pain tonight.  She had 3 episodes of pain, all of which came on at rest.  The first 2 are described as a tight feeling in the left anterior chest.  Both were relieved with nitroglycerin.  There was associated dyspnea and nausea but no vomiting and no diaphoresis.  Third episode was actually pain in the left upper arm which also resolved with nitroglycerin.  She is currently pain-free.  She does have known history of coronary artery disease status post bypass surgery and stent placement.  Past Medical History:  Diagnosis Date   Acute on chronic respiratory failure with hypoxia ([place]) 10/10/2016   Anxiety    Arthritis    AVM (arteriovenous malformation) of colon    CAD (coronary artery disease)    a. s/p CABG in 2013 b. most recent DES to D1 in 10/2016 with cath showing patent LIMA-LAD, SVG-RI, SVG-Mrg and SVG-RCA  c. NST in 02/2018 showing scar with no significant ischemia   Carotid artery disease (HCC)    a. 19-41% LICA, 01/4080    Chronic bronchitis (HCC)    Chronic diastolic CHF (congestive heart failure) ([place])    a. 02/2012 Echo EF 60-65%, nl wall motion, Gr 1 DD, mod MR   Colon cancer ([place]) 1992   Esophageal stricture    ESRD on hemodialysis ([place])    ESRD due to HTN, started dialysis 2011 and gets HD at [facility] with Dr Hinda Lenis on MWF schedule.  Access is LUA AVF as of Sept 2014.    GERD (gastroesophageal reflux disease)    High cholesterol 12/2011   History of blood transfusion 07/2011; 12/2011; 01/2012 X 2; 04/2012    History of gout    History of lower GI bleeding    Hypertension    Iron deficiency anemia    Mitral regurgitation    a. Moderate by echo, 02/2012   Myocardial infarction ([place])    Ovarian cancer ([place]) 1992   Pneumonia ~ 2009   PUD (peptic ulcer disease)    TIA (transient ischemic attack)     Patient Active Problem List   Diagnosis Date Noted   PAF (paroxysmal atrial fibrillation) ([place]) 08/06/2018   Chest pain at rest 07/13/2018   Hand steal syndrome ([place]) 08/01/2017   Anemia 07/14/2017   Coronary artery disease of bypass graft of native heart with stable angina pectoris ([place]) 06/05/2017   Mesenteric ischemia (HCC)    Diverticulitis    SBO (small bowel obstruction) ([place]) 03/22/2017   Enteritis    Complication of vascular access for dialysis 03/19/2017   Preoperative clearance 01/25/2017   Symptomatic anemia 10/24/2016   H/O non-ST elevation myocardial infarction (NSTEMI) 10/24/2016   Fluid overload 44/81/8563   Complication from renal dialysis device 10/10/2016   Non-ST elevation MI (NSTEMI) ([place])    Encounter for fitting and adjustment of vascular catheter    End stage renal disease ([place])    ESRD (end stage renal disease) ([place])    Heme positive stool    Demand ischemia ([place]) 07/27/2016   Hypertensive  emergency 07/08/2016   Acute on chronic respiratory failure with hypoxia [facility])    Cardiac arrest [facility])    Palliative care encounter    Goals of care, counseling/discussion    Hypertensive crisis without congestive heart failure 05/09/2016   Acute pulmonary edema ([place]) 04/06/2016   Acute respiratory failure ([place]) 04/06/2016   Hypertensive crisis 01/27/2016   History of colon cancer 01/27/2016   History of ovarian cancer 01/27/2016   Hypertensive urgency 01/27/2016   Narrow complex tachycardia ([place]) 09/08/2015   SVT (supraventricular tachycardia) ([place]) 09/08/2015   Influenza A 08/30/2015   Acute on chronic diastolic CHF  (congestive heart failure) ([place]) 05/04/2015   Unstable angina ([place]) 05/03/2015   Essential hypertension    Pain in joint, lower leg 08/14/2014   Small bowel obstruction, partial ([place]) 05/29/2013   Chronic diastolic CHF (congestive heart failure) ([place]) 03/22/2013   GI bleeding 03/21/2013   Acute blood loss anemia 03/21/2013   Vaginal odor 03/12/2013   Vaginal discharge 03/12/2013   Occlusion and stenosis of carotid artery without mention of cerebral infarction 01/24/2013   Hx of CABG 07/05/2012   Carotid artery disease ([place]) 07/05/2012   Anemia of chronic kidney failure 07/03/2012   Secondary hyperparathyroidism ([place]) 07/03/2012   Mitral regurgitation 06/12/2012   Pneumonia 06/09/2012   Non-STEMI (non-ST elevated myocardial infarction) ([place]) 06/08/2012   AVM (arteriovenous malformation) of small bowel, acquired 01/20/2012   GERD (gastroesophageal reflux disease) 01/09/2012   HLD (hyperlipidemia) 01/05/2012   Atherosclerosis of native coronary artery of native heart without angina pectoris 12/16/2011   ESRD on hemodialysis ([place]) 12/16/2011    Past Surgical History:  Procedure Laterality Date   ABDOMINAL HYSTERECTOMY  1992   APPENDECTOMY  06/1990   AV FISTULA PLACEMENT  07/2009   left upper arm   AV FISTULA PLACEMENT Right 09/06/2016   Procedure: RIGHT FOREARM ARTERIOVENOUS (AV) GRAFT;  Surgeon: Elam Dutch, MD;  Location: [facility] OR;  Service: Vascular;  Laterality: Right;   AV FISTULA PLACEMENT N/A 02/24/2017   Procedure: INSERTION OF ARTERIOVENOUS (AV) GORE-TEX GRAFT ARM (BRACHIAL AXILLARY);  Surgeon: Katha Cabal, MD;  Location: [facility] ORS;  Service: Vascular;  Laterality: N/A;   [place] Right 09/06/2016   Procedure: REMOVAL OF Right Arm ARTERIOVENOUS GORETEX GRAFT and Vein Patch angioplasty of brachial artery;  Surgeon: Angelia Mould, MD;  Location: [place];  Service: Vascular;  Laterality: Right;   [place] WITH STENT PLACEMENT  12/15/11   "2"   CORONARY ANGIOPLASTY WITH STENT PLACEMENT  y/2013   "1; makes total of 3" (05/02/2012)   CORONARY ARTERY BYPASS GRAFT  06/13/2012   Procedure: CORONARY ARTERY BYPASS GRAFTING (CABG);  Surgeon: Grace Isaac, MD;  Location: [place];  Service: Open Heart Surgery;  Laterality: N/A;  cabg x four;  using left internal mammary artery, and left leg greater saphenous vein harvested endoscopically   CORONARY STENT INTERVENTION N/A 10/13/2016   Procedure: Coronary Stent Intervention;  Surgeon: [place] Sine, MD;  Location: [place] CV LAB;  Service: Cardiovascular;  Laterality: N/A;   DIALYSIS/PERMA CATHETER REMOVAL N/A 04/18/2017   Procedure: DIALYSIS/PERMA CATHETER REMOVAL;  Surgeon: Katha Cabal, MD;  Location: [place] CV LAB;  Service: Cardiovascular;  Laterality: N/A;   DILATION AND CURETTAGE OF UTERUS     ESOPHAGOGASTRODUODENOSCOPY  01/20/2012   Procedure: ESOPHAGOGASTRODUODENOSCOPY (EGD);  Surgeon: Ladene Artist, MD,FACG;  Location: [facility] ENDOSCOPY;  Service: Endoscopy;  Laterality:  N/A;   ESOPHAGOGASTRODUODENOSCOPY N/A 03/26/2013   Procedure: ESOPHAGOGASTRODUODENOSCOPY (EGD);  Surgeon: Irene Shipper, MD;  Location: [facility] ENDOSCOPY;  Service: Endoscopy;  Laterality: N/A;   ESOPHAGOGASTRODUODENOSCOPY N/A 04/30/2015   Procedure: ESOPHAGOGASTRODUODENOSCOPY (EGD);  Surgeon: Rogene Houston, MD;  [facility]: [facility] ENDO SUITE;  Service: Endoscopy;  Laterality: N/A;  1pm - moved to 10/20 @ 1:10   ESOPHAGOGASTRODUODENOSCOPY N/A 07/29/2016   Procedure: ESOPHAGOGASTRODUODENOSCOPY (EGD);  Surgeon: Manus Gunning, MD;  Location: [place];  Service: Gastroenterology;  Laterality: N/A;  enteroscopy   INTRAOPERATIVE TRANSESOPHAGEAL ECHOCARDIOGRAM  06/13/2012   Procedure: INTRAOPERATIVE TRANSESOPHAGEAL ECHOCARDIOGRAM;  Surgeon: Grace Isaac, MD;  Location: [place];  Service: Open Heart Surgery;  Laterality: N/A;   IR GENERIC  HISTORICAL  07/26/2016   IR FLUORO GUIDE CV LINE RIGHT 07/26/2016 Greggory Keen, MD [facility]-INTER[facility] RAD   IR GENERIC HISTORICAL  07/26/2016   IR US GUIDE VASC ACCESS RIGHT 07/26/2016 Greggory Keen, MD [facility]   IR GENERIC HISTORICAL  08/02/2016   IR US GUIDE VASC ACCESS RIGHT 08/02/2016 Greggory Keen, MD [facility]   IR GENERIC HISTORICAL  08/02/2016   IR FLUORO GUIDE CV LINE RIGHT 08/02/2016 Greggory Keen, MD [facility]   IR RADIOLOGY PERIPHERAL GUIDED IV START  03/28/2017   IR US GUIDE VASC ACCESS RIGHT  03/28/2017   LEFT HEART CATH AND CORONARY ANGIOGRAPHY N/A 09/20/2016   Procedure: Left Heart Cath and Coronary Angiography;  Surgeon: [name], MD;  Location: [place] CV LAB;  Service: Cardiovascular;  Laterality: N/A;   LEFT HEART CATH AND CORS/GRAFTS ANGIOGRAPHY N/A 10/13/2016   Procedure: Left Heart Cath and Cors/Grafts Angiography;  Surgeon: [place] Sine, MD;  Location: [place] CV LAB;  Service: Cardiovascular;  Laterality: N/A;   LEFT HEART CATH AND CORS/GRAFTS ANGIOGRAPHY N/A 07/13/2018   Procedure: LEFT HEART CATH AND CORS/GRAFTS ANGIOGRAPHY;  Surgeon: Martinique, Peter M, MD;  Location: [place] CV LAB;  Service: Cardiovascular;  Laterality: N/A;   LEFT HEART CATHETERIZATION WITH CORONARY ANGIOGRAM N/A 12/15/2011   Procedure: LEFT HEART CATHETERIZATION WITH CORONARY ANGIOGRAM;  Surgeon: Burnell Blanks, MD;  Location: [facility] CATH LAB;  Service: Cardiovascular;  Laterality: N/A;   LEFT HEART CATHETERIZATION WITH CORONARY ANGIOGRAM N/A 01/10/2012   Procedure: LEFT HEART CATHETERIZATION WITH CORONARY ANGIOGRAM;  Surgeon: Peter M Martinique, MD;  Location: [facility] CATH LAB;  Service: Cardiovascular;  Laterality: N/A;   LEFT HEART CATHETERIZATION WITH CORONARY ANGIOGRAM N/A 06/08/2012   Procedure: LEFT HEART CATHETERIZATION WITH CORONARY ANGIOGRAM;  Surgeon: Burnell Blanks, MD;  Location: [facility] CATH LAB;  Service: Cardiovascular;  Laterality: N/A;   LEFT HEART CATHETERIZATION  WITH CORONARY/GRAFT ANGIOGRAM N/A 12/10/2013   Procedure: LEFT HEART CATHETERIZATION WITH Beatrix Fetters;  Surgeon: [name], MD;  Location: [facility] CATH LAB;  Service: Cardiovascular;  Laterality: N/A;   OVARY SURGERY     ovarian cancer   REVISION OF ARTERIOVENOUS GORETEX GRAFT N/A 02/24/2017   Procedure: REVISION OF ARTERIOVENOUS GORETEX GRAFT (RESECTION);  Surgeon: Katha Cabal, MD;  Location: [facility] ORS;  Service: Vascular;  Laterality: N/A;   SHUNTOGRAM N/A 10/15/2013   Procedure: Fistulogram;  Surgeon: Serafina Mitchell, MD;  Location: [facility] CATH LAB;  Service: Cardiovascular;  Laterality: N/A;   THROMBECTOMY / ARTERIOVENOUS GRAFT REVISION  2011   left upper arm   TUBAL LIGATION  1980's   UPPER EXTREMITY ANGIOGRAPHY Bilateral 12/06/2016   Procedure: Upper Extremity Angiography;  Surgeon: Katha Cabal, MD;  Location: [place] CV LAB;  Service: Cardiovascular;  Laterality: Bilateral;   UPPER EXTREMITY INTERVENTION Left 06/06/2017   Procedure: UPPER EXTREMITY INTERVENTION;  Surgeon: Katha Cabal, MD;  Location: [place] CV LAB;  Service: Cardiovascular;  Laterality: Left;     OB History    Gravida  2   Para  2   Term      Preterm  2   AB      Living  2     SAB      TAB      Ectopic      Multiple      Live Births               Home Medications    Prior to Admission medications   Medication Sig Start Date End Date Taking? Authorizing Provider  ALPRAZolam Duanne Moron) 0.5 MG tablet Take 0.5 mg by mouth daily. 10/16/18   [provider]  amiodarone (PACERONE) 200 MG tablet Take 1 tablet (200 mg total) by mouth daily. 07/14/18   Hosie Poisson, MD  bisacodyl (DULCOLAX) 10 MG suppository Place 1 suppository (10 mg total) rectally as needed for moderate constipation. 11/21/17   Rehman, Mechele Dawley, MD  carvedilol (COREG) 6.25 MG tablet Take 6.25 mg by mouth 2 (two) times daily with a meal.    [provider]  cinacalcet  (SENSIPAR) 30 MG tablet Take 30 mg by mouth daily after supper.    [provider]  diphenhydrAMINE (BENADRYL) 25 mg capsule Take 50 mg by mouth daily as needed for itching or allergies.     [provider]  diphenoxylate-atropine (LOMOTIL) 2.5-0.025 MG tablet Take 1 tablet by mouth 4 (four) times daily as needed for diarrhea or loose stools. 08/13/17   Tanna Furry, MD  epoetin alfa (EPOGEN,PROCRIT) 53614 UNIT/ML injection Inject 1 mL (10,000 Units total) into the vein every Monday, Wednesday, and Friday with hemodialysis. 04/11/16   Orvan Falconer, MD  ferrous sulfate 325 (65 FE) MG EC tablet Take 1 tablet (325 mg total) by mouth 2 (two) times daily with a meal. Patient taking differently: Take 325 mg by mouth daily with breakfast.  07/15/17 10/18/18  Johnson, Clanford L, MD  fluticasone (FLONASE) 50 MCG/ACT nasal spray Place 1 spray at bedtime as needed into both nostrils for allergies.     [provider]  isosorbide mononitrate (IMDUR) 120 MG 24 hr tablet Take 1 tablet by mouth once daily 09/21/18   Herminio Commons, MD  lidocaine-prilocaine (EMLA) cream Apply 1 application topically every Monday, Wednesday, and Friday. Prior to dialysis    [provider]  loratadine (CLARITIN) 10 MG tablet Take 1 tablet (10 mg total) by mouth daily as needed for allergies. 07/14/18   Hosie Poisson, MD  multivitamin (RENA-VIT) TABS tablet Take 1 tablet by mouth daily.    [provider]  nitroGLYCERIN (NITROSTAT) 0.4 MG SL tablet DISSOLVE ONE TABLET UNDER THE TONGUE EVERY 5 MINUTES AS NEEDED FOR CHEST PAIN.  DO NOT EXCEED A TOTAL OF 3 DOSES IN 15 MINUTES 10/08/18   Herminio Commons, MD  omeprazole (PRILOSEC) 20 MG capsule Take 1 capsule (20 mg total) by mouth daily. 11/21/17   Rehman, Mechele Dawley, MD  ondansetron (ZOFRAN-ODT) 4 MG disintegrating tablet Take 4 mg by mouth every 8 (eight) hours as needed for nausea or vomiting.  07/12/17   [provider]  sevelamer carbonate  (RENVELA) 800 MG tablet Take 800-2,400 mg by mouth See admin instructions. Take 3 tablets (2400) by mouth twice daily with meals,  take 1 tablet (800 mg) with snacks    [provider]  simvastatin (ZOCOR) 20 MG tablet TAKE ONE TABLET (20 MG) BY MOUTH DAILY AT BEDTIME 04/03/18   Herminio Commons, MD  ticagrelor (BRILINTA) 60 MG TABS tablet Take 1 tablet (60 mg total) by mouth 2 (two) times daily. 08/17/18   Almyra Deforest, PA    Family History Family History  Problem Relation Age of Onset   Heart disease Mother        Heart Disease before age 71   Hyperlipidemia Mother    Hypertension Mother    Diabetes Mother    Heart attack Mother    Heart disease Father        Heart Disease before age 24   Hyperlipidemia Father    Hypertension Father    Diabetes Father    Diabetes Sister    Hypertension Sister    Diabetes Brother    Hyperlipidemia Brother    Heart attack Brother    Hypertension Sister    Heart attack Brother    Other Other        noncontributory for early CAD   Colon cancer Neg Hx    Esophageal cancer Neg Hx    Liver disease Neg Hx    Kidney disease Neg Hx    Colon polyps Neg Hx     Social History Social History   Tobacco Use   Smoking status: Never Smoker   Smokeless tobacco: Never Used  Substance Use Topics   Alcohol use: No    Alcohol/week: 0.0 standard drinks   Drug use: No     Allergies   Aspirin; Penicillins; Amlodipine; Bactrim [sulfamethoxazole-trimethoprim]; Contrast media [iodinated diagnostic agents]; Iron; Nitrofurantoin; Tylenol [acetaminophen]; Gabapentin; Dexilant [dexlansoprazole]; Levaquin [levofloxacin in d5w]; Morphine and related; Plavix [clopidogrel bisulfate]; Protonix [pantoprazole sodium]; and Venofer [ferric oxide]   Review of Systems Review of Systems  All other systems reviewed and are negative.    Physical Exam Updated Vital Signs BP (!) 204/78    Pulse 87    Temp 99.1 F (37.3 C) (Oral)    Resp  19    Ht 5\' 1"  (1.549 m)    Wt 59.4 kg    SpO2 97%    BMI 24.75 kg/m   Physical Exam Vitals signs and nursing note reviewed.    78 year old female, resting comfortably and in no acute distress. Vital signs are significant for elevated blood pressure and heart rate. Oxygen saturation is 97%, which is normal. Head is normocephalic and atraumatic. PERRLA, EOMI. Oropharynx is clear. Neck is nontender and supple without adenopathy or JVD. Back is nontender and there is no CVA tenderness. Lungs are clear without rales, wheezes, or rhonchi. Chest is nontender. Heart has regular rate and rhythm without murmur. Abdomen is soft, flat, nontender without masses or hepatosplenomegaly and peristalsis is normoactive. Extremities have trace edema, full range of motion is present.  AV fistula present in the left upper arm with thrill present. Skin is warm and dry without rash. Neurologic: Mental status is normal, cranial nerves are intact, there are no motor or sensory deficits.  ED Treatments / Results  Labs (all labs ordered are listed, but only abnormal results are displayed) Labs Reviewed  BASIC METABOLIC PANEL - Abnormal; Notable for the following components:      Result Value   Glucose, Bld 109 (*)    BUN 37 (*)    Creatinine, Ser 7.62 (*)    Calcium 11.6 (*)  GFR calc non Af Amer 5 (*)    GFR calc Af Amer 5 (*)    Anion gap 16 (*)    All other components within normal limits  CBC WITH DIFFERENTIAL/PLATELET - Abnormal; Notable for the following components:   RBC 3.15 (*)    Hemoglobin 11.0 (*)    HCT 34.6 (*)    MCV 109.8 (*)    MCH 34.9 (*)    All other components within normal limits  TROPONIN I - Abnormal; Notable for the following components:   Troponin I 0.07 (*)    All other components within normal limits  SARS CORONAVIRUS 2 (HOSPITAL ORDER, [place] LAB)  MAGNESIUM  APTT  PROTIME-INR  TROPONIN I  HEPARIN LEVEL (UNFRACTIONATED)    EKG EKG  Interpretation  Date/Time:  Friday December 14 2018 01:39:19 EDT Ventricular Rate:  87 PR Interval:    QRS Duration: 111 QT Interval:  421 QTC Calculation: 507 R Axis:   6 Text Interpretation:  Sinus rhythm LVH with secondary repolarization abnormality Anterior infarct, old Prolonged QT interval Baseline wander in lead(s) V6 When compared with ECG of 07/14/2018, QT has shortened REPOLARIZATION ABNORMALITY more pronounces in  Lateral leads Confirmed by Delora Fuel ([id number]) on 12/14/2018 1:42:24 AM   Radiology Dg Chest Port 1 View  Result Date: 12/14/2018 CLINICAL DATA:  Left chest pain EXAM: PORTABLE CHEST 1 VIEW COMPARISON:  10/02/2018 FINDINGS: The heart size is enlarged. The patient is status post prior median sternotomy. There is volume overload with small bilateral pleural effusions. There are emphysematous changes bilaterally. No pneumothorax. No large area of consolidation. There is likely atelectasis versus scarring at the lung bases. IMPRESSION: Cardiomegaly with volume overload and findings suspicious for developing trace to small pleural effusions. Electronically Signed   By: Constance Holster M.D.   On: 12/14/2018 02:21    Procedures Procedures  CRITICAL CARE Performed by: Delora Fuel Total critical care time: 45 minutes Critical care time was exclusive of separately billable procedures and treating other patients. Critical care was necessary to treat or prevent imminent or life-threatening deterioration. Critical care was time spent personally by me on the following activities: development of treatment plan with patient and/or surrogate as well as nursing, discussions with consultants, evaluation of patient's response to treatment, examination of patient, obtaining history from patient or surrogate, ordering and performing treatments and interventions, ordering and review of laboratory studies, ordering and review of radiographic studies, pulse oximetry and re-evaluation of patient's  condition.  Medications Ordered in ED Medications - No data to display   Initial Impression / Assessment and Plan / ED Course  I have reviewed the triage vital signs and the nursing notes.  Pertinent labs & imaging results that were available during my care of the patient were reviewed by me and considered in my medical decision making (see chart for details).  Chest pain in a pattern suggestive of unstable angina with 3 episodes of chest pain tonight.  ECG does show some minor ST and T changes compared with most recent ECG.  Will check troponin level and start patient on heparin and topical nitroglycerin.  Nitroglycerin should help with blood pressure control as well.  Old records are reviewed, and most recent cardiac catheterization done January 2020 showed 75% lesion of the right posterior descending artery which was felt to be because of chest pain at that time.  She had patent left internal mammary artery graft and saphenous vein grafts.  She has remained pain-free  in the ED.  Labs are significant for mildly elevated troponin which is in a similar range as prior values.  Delta troponin is pending.  Chest x-ray shows cardiomegaly consistent with history of CHF.  Case is discussed with Dr. Maudie Mercury of [facility] hospitalist, who agrees to admit the patient.  Final Clinical Impressions(s) / ED Diagnoses   Final diagnoses:  Unstable angina ([place])  End-stage renal disease on hemodialysis ([place])  Anemia associated with chronic renal failure  Elevated blood pressure reading with diagnosis of hypertension    ED Discharge Orders    None       Delora Fuel, MD 53/66/44 [id number]

## 2018-12-14 NOTE — Progress Notes (Signed)
Per HPI: Shawna [name]  is a 78 y.o. female, w Gerd/ esophageal stricture,h/o  LGIB (avm),  remote h/o colon cancer, hypertension, hyperlipidemia, Carotid stenosis, TIA, ESRD on HD, (M,W,F), L AVF,  Pafib/NSVT, MR(mild),  CAD s/p CABG 06/13/2012, s/p DES prox Lcx, and D1 12/15/2011, NSTEMI w DES D1 10/13/2016 with recent cath 07/13/2018 =>  EF 55-65%,  1. Severe 3 vessel occlusive CAD - 60% distal left main - 90% mid LAD - 100% first diagonal at prior stent site - 99% proximal LCx at prior stent site - 80% ramus intermediate. - 100% ostial RCA at prior stent site - 75% proximal PDA 2. Patent LIMA to the LAD 3. Patent SVG to ramus intermediate 4. Patent SVG to OM 5. Patent SVG to the distal RCA. 6. Good LV function 7. Mildly elevated LVEDP  Plan: Compared to prior cath in April 2018 the diagonal is now occluded. There is progression of disease in the PDA. It is unclear if this is the reason for her chest pain. She is severely hypertensive and EDP is mildly elevated. I would recommend medical management with good BP control. If she has refractory angina despite good BP and volume management we could consider PCI of the PDA but this would have to be done through the SVG and in general she has not responded well to stenting in the past with all prior stents occluded. I will increase her Coreg dose.  C/o substernal chest pressure w radiation to left shoulder starting about 5:30 pm,  Relief with nitro, and then returned and therefore presented to ED.  Chest pain associated with slight sob as well as nausea.  Used a breathing treatment without relief as well. Pt notes that has had intermittent chest pain over the past several days.  Culprit lesion thought to be progression of disease in prox PDA, medical management with good BP control was suggested at that time as above.   Pt denies fever, chills, cough, emesis, abd pain, diarrhea, brbpr.  Pt states that she may not have had full  dialysis treatment due to her bp being low on this past Wednesday.  Pt noted to be hypertensive.  Pt denies noncompliance with medication.  Pt presented to ED for chest pain.  Patient was seen by nephrology with plans for dialysis later today.  She was also seen by cardiology with flat troponin trend and no new changes on echocardiogram noted.  Plans are to wean off nitroglycerin drip as patient no longer has any further chest pain and will plan to discontinue heparin drip in a.m.  Hopefully, can anticipate discharge in a.m.

## 2018-12-14 NOTE — H&P (Addendum)
TRH H&P    Patient Demographics:    Shawna [name], is a 78 y.o. female  MRN: [id number]  DOB - 03/23/1941  Admit Date - 12/14/2018  Referring MD/NP/PA:  Delora Fuel  Outpatient Primary MD for the patient is [name], MD [facility] - Cardiology [place] - nephrology Lanelle Bal -CT surgery  Patient coming from:  home  Chief complaint- chest pain   HPI:    Shawna [name]  is a 78 y.o. female, w Gerd/ esophageal stricture,h/o  LGIB (avm),  remote h/o colon cancer, hypertension, hyperlipidemia, Carotid stenosis, TIA, ESRD on HD, (M,W,F), L AVF,  Pafib/NSVT, MR(mild),  CAD s/p CABG 06/13/2012, s/p DES prox Lcx, and D1 12/15/2011, NSTEMI w DES D1 10/13/2016 with recent cath 07/13/2018 =>  EF 55-65%,  1. Severe 3 vessel occlusive CAD    - 60% distal left main    - 90% mid LAD    - 100% first diagonal at prior stent site    - 99% proximal LCx at prior stent site    - 80% ramus intermediate.    - 100% ostial RCA at prior stent site    - 75% proximal PDA 2. Patent LIMA to the LAD 3. Patent SVG to ramus intermediate 4. Patent SVG to OM 5. Patent SVG to the distal RCA. 6. Good LV function 7. Mildly elevated LVEDP  Plan: Compared to prior cath in April 2018 the diagonal is now occluded. There is progression of disease in the PDA. It is unclear if this is the reason for her chest pain. She is severely hypertensive and EDP is mildly elevated. I would recommend medical management with good BP control. If she has refractory angina despite good BP and volume management we could consider PCI of the PDA but this would have to be done through the SVG and in general she has not responded well to stenting in the past with all prior stents occluded. I will increase her Coreg dose.  C/o substernal chest pressure w radiation to left shoulder starting about 5:30 pm,  Relief with nitro, and then returned and therefore presented to  ED.  Chest pain associated with slight sob as well as nausea.  Used a breathing treatment without relief as well. Pt notes that has had intermittent chest pain over the past several days.  Culprit lesion thought to be progression of disease in prox PDA, medical management with good BP control was suggested at that time as above.   Pt denies fever, chills, cough, emesis, abd pain, diarrhea, brbpr.  Pt states that she may not have had full dialysis treatment due to her bp being low on this past Wednesday.  Pt noted to be hypertensive.  Pt denies noncompliance with medication.  Pt presented to ED for chest pain  In ED,  T 99.1  P 104 R 22 Bp 230/88  Pox 96%  Wt 59.4kg  Trop 0.07 (prev 0.06), INR 1.0  EKG: nsr at 85, nl axis, 1st degree avb, Q in v1-3, with LVH with st elevation (old) when compared  with ekg 07/14/2018  CXR IMPRESSION: Cardiomegaly with volume overload and findings suspicious for developing trace to small pleural effusions.  covid -19  Negative  Wbc 8.3, hgb 11.0, Plt 153 Bun 37, Creatinine 7.62 Calcium 11.6  (prev albumin 2.9) Magnesium 2.0  Pt will be admitted for hypertensive urgency/ emergency w unstable angina.        Review of systems:    In addition to the HPI above,  No Fever-chills, No Headache, No changes with Vision or hearing, No problems swallowing food or Liquids, No Cough  No Abdominal pain, No Nausea or Vomiting, bowel movements are regular, No Blood in stool or Urine, No dysuria, No new skin rashes or bruises, No new joints pains-aches,  No new weakness, tingling, numbness in any extremity, No recent weight gain or loss, No polyuria, polydypsia or polyphagia, No significant Mental Stressors.  All other systems reviewed and are negative.    Past History of the following :    Past Medical History:  Diagnosis Date  . Acute on chronic respiratory failure with hypoxia ([place]) 10/10/2016  . Anxiety   . Arthritis   . AVM (arteriovenous  malformation) of colon   . CAD (coronary artery disease)    a. s/p CABG in 2013 b. most recent DES to D1 in 10/2016 with cath showing patent LIMA-LAD, SVG-RI, SVG-Mrg and SVG-RCA  c. NST in 02/2018 showing scar with no significant ischemia  . Carotid artery disease ([place])    a. 42-59% LICA, 11/6385   . Chronic bronchitis ([place])   . Chronic diastolic CHF (congestive heart failure) ([place])    a. 02/2012 Echo EF 60-65%, nl wall motion, Gr 1 DD, mod MR  . Colon cancer ([place]) 1992  . Esophageal stricture   . ESRD on hemodialysis ([place])    ESRD due to HTN, started dialysis 2011 and gets HD at [facility] with Dr Hinda Lenis on MWF schedule.  Access is LUA AVF as of Sept 2014.   Marland Kitchen GERD (gastroesophageal reflux disease)   . High cholesterol 12/2011  . History of blood transfusion 07/2011; 12/2011; 01/2012 X 2; 04/2012  . History of gout   . History of lower GI bleeding   . Hypertension   . Iron deficiency anemia   . Mitral regurgitation    a. Moderate by echo, 02/2012  . Myocardial infarction ([place])   . Ovarian cancer ([place]) 1992  . Pneumonia ~ 2009  . PUD (peptic ulcer disease)   . TIA (transient ischemic attack)       Past Surgical History:  Procedure Laterality Date  . ABDOMINAL HYSTERECTOMY  1992  . APPENDECTOMY  06/1990  . AV FISTULA PLACEMENT  07/2009   left upper arm  . AV FISTULA PLACEMENT Right 09/06/2016   Procedure: RIGHT FOREARM ARTERIOVENOUS (AV) GRAFT;  Surgeon: Elam Dutch, MD;  Location: [facility] OR;  Service: Vascular;  Laterality: Right;  . AV FISTULA PLACEMENT N/A 02/24/2017   Procedure: INSERTION OF ARTERIOVENOUS (AV) GORE-TEX GRAFT ARM (BRACHIAL AXILLARY);  Surgeon: Katha Cabal, MD;  Location: [facility] ORS;  Service: Vascular;  Laterality: N/A;  . [place] Right 09/06/2016   Procedure: REMOVAL OF Right Arm ARTERIOVENOUS GORETEX GRAFT and Vein Patch angioplasty of brachial artery;  Surgeon: Angelia Mould, MD;  Location: [place];  Service: Vascular;  Laterality: Right;  .  COLON RESECTION  1992  . COLON SURGERY    . CORONARY ANGIOPLASTY WITH STENT PLACEMENT  12/15/11   "2"  . CORONARY ANGIOPLASTY WITH STENT  PLACEMENT  y/2013   "1; makes total of 3" (05/02/2012)  . CORONARY ARTERY BYPASS GRAFT  06/13/2012   Procedure: CORONARY ARTERY BYPASS GRAFTING (CABG);  Surgeon: Grace Isaac, MD;  Location: [place];  Service: Open Heart Surgery;  Laterality: N/A;  cabg x four;  using left internal mammary artery, and left leg greater saphenous vein harvested endoscopically  . CORONARY STENT INTERVENTION N/A 10/13/2016   Procedure: Coronary Stent Intervention;  Surgeon: Troy Sine, MD;  Location: [place] CV LAB;  Service: Cardiovascular;  Laterality: N/A;  . DIALYSIS/PERMA CATHETER REMOVAL N/A 04/18/2017   Procedure: DIALYSIS/PERMA CATHETER REMOVAL;  Surgeon: Katha Cabal, MD;  Location: [place] CV LAB;  Service: Cardiovascular;  Laterality: N/A;  . DILATION AND CURETTAGE OF UTERUS    . ESOPHAGOGASTRODUODENOSCOPY  01/20/2012   Procedure: ESOPHAGOGASTRODUODENOSCOPY (EGD);  Surgeon: Ladene Artist, MD,FACG;  Location: [facility] ENDOSCOPY;  Service: Endoscopy;  Laterality: N/A;  . ESOPHAGOGASTRODUODENOSCOPY N/A 03/26/2013   Procedure: ESOPHAGOGASTRODUODENOSCOPY (EGD);  Surgeon: Irene Shipper, MD;  Location: [facility] ENDOSCOPY;  Service: Endoscopy;  Laterality: N/A;  . ESOPHAGOGASTRODUODENOSCOPY N/A 04/30/2015   Procedure: ESOPHAGOGASTRODUODENOSCOPY (EGD);  Surgeon: Rogene Houston, MD;  [facility];  Service: Endoscopy;  Laterality: N/A;  1pm - moved to 10/20 @ 1:10  . ESOPHAGOGASTRODUODENOSCOPY N/A 07/29/2016   Procedure: ESOPHAGOGASTRODUODENOSCOPY (EGD);  Surgeon: Manus Gunning, MD;  Location: [place];  Service: Gastroenterology;  Laterality: N/A;  enteroscopy  . INTRAOPERATIVE TRANSESOPHAGEAL ECHOCARDIOGRAM  06/13/2012   Procedure: INTRAOPERATIVE TRANSESOPHAGEAL ECHOCARDIOGRAM;  Surgeon: Grace Isaac, MD;  Location: [place];  Service: Open Heart  Surgery;  Laterality: N/A;  . IR GENERIC HISTORICAL  07/26/2016   IR FLUORO GUIDE CV LINE RIGHT 07/26/2016 Greggory Keen, MD [facility]  . IR GENERIC HISTORICAL  07/26/2016   IR US GUIDE VASC ACCESS RIGHT 07/26/2016 Greggory Keen, MD [facility]  . IR GENERIC HISTORICAL  08/02/2016   IR US GUIDE VASC ACCESS RIGHT 08/02/2016 Greggory Keen, MD [facility]  . IR GENERIC HISTORICAL  08/02/2016   IR FLUORO GUIDE CV LINE RIGHT 08/02/2016 Greggory Keen, MD [facility]  . IR RADIOLOGY PERIPHERAL GUIDED IV START  03/28/2017  . IR US GUIDE VASC ACCESS RIGHT  03/28/2017  . LEFT HEART CATH AND CORONARY ANGIOGRAPHY N/A 09/20/2016   Procedure: Left Heart Cath and Coronary Angiography;  Surgeon: [name], MD;  Location: [place] CV LAB;  Service: Cardiovascular;  Laterality: N/A;  . LEFT HEART CATH AND CORS/GRAFTS ANGIOGRAPHY N/A 10/13/2016   Procedure: Left Heart Cath and Cors/Grafts Angiography;  Surgeon: Troy Sine, MD;  Location: [place] CV LAB;  Service: Cardiovascular;  Laterality: N/A;  . LEFT HEART CATH AND CORS/GRAFTS ANGIOGRAPHY N/A 07/13/2018   Procedure: LEFT HEART CATH AND CORS/GRAFTS ANGIOGRAPHY;  Surgeon: Martinique, Peter M, MD;  Location: [name] CV LAB;  Service: Cardiovascular;  Laterality: N/A;  . LEFT HEART CATHETERIZATION WITH CORONARY ANGIOGRAM N/A 12/15/2011   Procedure: LEFT HEART CATHETERIZATION WITH CORONARY ANGIOGRAM;  Surgeon: Burnell Blanks, MD;  Location: [facility] CATH LAB;  Service: Cardiovascular;  Laterality: N/A;  . LEFT HEART CATHETERIZATION WITH CORONARY ANGIOGRAM N/A 01/10/2012   Procedure: LEFT HEART CATHETERIZATION WITH CORONARY ANGIOGRAM;  Surgeon: Peter M Martinique, MD;  Location: [facility] CATH LAB;  Service: Cardiovascular;  Laterality: N/A;  . LEFT HEART CATHETERIZATION WITH CORONARY ANGIOGRAM N/A 06/08/2012   Procedure: LEFT HEART CATHETERIZATION WITH CORONARY ANGIOGRAM;  Surgeon: Burnell Blanks, MD;  Location: [facility] CATH LAB;  Service:  Cardiovascular;   Laterality: N/A;  . LEFT HEART CATHETERIZATION WITH CORONARY/GRAFT ANGIOGRAM N/A 12/10/2013   Procedure: LEFT HEART CATHETERIZATION WITH Beatrix Fetters;  Surgeon: [name], MD;  Location: [facility] CATH LAB;  Service: Cardiovascular;  Laterality: N/A;  . OVARY SURGERY     ovarian cancer  . REVISION OF ARTERIOVENOUS GORETEX GRAFT N/A 02/24/2017   Procedure: REVISION OF ARTERIOVENOUS GORETEX GRAFT (RESECTION);  Surgeon: Katha Cabal, MD;  Location: [facility] ORS;  Service: Vascular;  Laterality: N/A;  Earney Mallet N/A 10/15/2013   Procedure: Fistulogram;  Surgeon: Serafina Mitchell, MD;  Location: [facility] CATH LAB;  Service: Cardiovascular;  Laterality: N/A;  . THROMBECTOMY / ARTERIOVENOUS GRAFT REVISION  2011   left upper arm  . TUBAL LIGATION  1980's  . UPPER EXTREMITY ANGIOGRAPHY Bilateral 12/06/2016   Procedure: Upper Extremity Angiography;  Surgeon: Katha Cabal, MD;  Location: [place] CV LAB;  Service: Cardiovascular;  Laterality: Bilateral;  . UPPER EXTREMITY INTERVENTION Left 06/06/2017   Procedure: UPPER EXTREMITY INTERVENTION;  Surgeon: Katha Cabal, MD;  Location: [place] CV LAB;  Service: Cardiovascular;  Laterality: Left;      Social History:      Social History   Tobacco Use  . Smoking status: Never Smoker  . Smokeless tobacco: Never Used  Substance Use Topics  . Alcohol use: No    Alcohol/week: 0.0 standard drinks       Family History :     Family History  Problem Relation Age of Onset  . Heart disease Mother        Heart Disease before age 72  . Hyperlipidemia Mother   . Hypertension Mother   . Diabetes Mother   . Heart attack Mother   . Heart disease Father        Heart Disease before age 67  . Hyperlipidemia Father   . Hypertension Father   . Diabetes Father   . Diabetes Sister   . Hypertension Sister   . Diabetes Brother   . Hyperlipidemia Brother   . Heart attack Brother   . Hypertension Sister   . Heart attack  Brother   . Other Other        noncontributory for early CAD  . Colon cancer Neg Hx   . Esophageal cancer Neg Hx   . Liver disease Neg Hx   . Kidney disease Neg Hx   . Colon polyps Neg Hx        Home Medications:   Prior to Admission medications   Medication Sig Start Date End Date Taking? Authorizing Provider  ALPRAZolam Duanne Moron) 0.5 MG tablet Take 0.5 mg by mouth daily. 10/16/18   [provider]  amiodarone (PACERONE) 200 MG tablet Take 1 tablet (200 mg total) by mouth daily. 07/14/18   Hosie Poisson, MD  bisacodyl (DULCOLAX) 10 MG suppository Place 1 suppository (10 mg total) rectally as needed for moderate constipation. 11/21/17   Rehman, Mechele Dawley, MD  carvedilol (COREG) 6.25 MG tablet Take 6.25 mg by mouth 2 (two) times daily with a meal.    [provider]  cinacalcet (SENSIPAR) 30 MG tablet Take 30 mg by mouth daily after supper.    [provider]  diphenhydrAMINE (BENADRYL) 25 mg capsule Take 50 mg by mouth daily as needed for itching or allergies.     [provider]  diphenoxylate-atropine (LOMOTIL) 2.5-0.025 MG tablet Take 1 tablet by mouth 4 (four) times daily as needed for diarrhea or loose stools. 08/13/17  Tanna Furry, MD  epoetin alfa (EPOGEN,PROCRIT) 01027 UNIT/ML injection Inject 1 mL (10,000 Units total) into the vein every Monday, Wednesday, and Friday with hemodialysis. 04/11/16   Orvan Falconer, MD  ferrous sulfate 325 (65 FE) MG EC tablet Take 1 tablet (325 mg total) by mouth 2 (two) times daily with a meal. Patient taking differently: Take 325 mg by mouth daily with breakfast.  07/15/17 10/18/18  Johnson, Clanford L, MD  fluticasone (FLONASE) 50 MCG/ACT nasal spray Place 1 spray at bedtime as needed into both nostrils for allergies.     [provider]  isosorbide mononitrate (IMDUR) 120 MG 24 hr tablet Take 1 tablet by mouth once daily 09/21/18   Herminio Commons, MD  lidocaine-prilocaine (EMLA) cream Apply 1 application topically  every Monday, Wednesday, and Friday. Prior to dialysis    [provider]  loratadine (CLARITIN) 10 MG tablet Take 1 tablet (10 mg total) by mouth daily as needed for allergies. 07/14/18   Hosie Poisson, MD  multivitamin (RENA-VIT) TABS tablet Take 1 tablet by mouth daily.    [provider]  nitroGLYCERIN (NITROSTAT) 0.4 MG SL tablet DISSOLVE ONE TABLET UNDER THE TONGUE EVERY 5 MINUTES AS NEEDED FOR CHEST PAIN.  DO NOT EXCEED A TOTAL OF 3 DOSES IN 15 MINUTES 10/08/18   Herminio Commons, MD  omeprazole (PRILOSEC) 20 MG capsule Take 1 capsule (20 mg total) by mouth daily. 11/21/17   Rehman, Mechele Dawley, MD  ondansetron (ZOFRAN-ODT) 4 MG disintegrating tablet Take 4 mg by mouth every 8 (eight) hours as needed for nausea or vomiting.  07/12/17   [provider]  sevelamer carbonate (RENVELA) 800 MG tablet Take 800-2,400 mg by mouth See admin instructions. Take 3 tablets (2400) by mouth twice daily with meals, take 1 tablet (800 mg) with snacks    [provider]  simvastatin (ZOCOR) 20 MG tablet TAKE ONE TABLET (20 MG) BY MOUTH DAILY AT BEDTIME 04/03/18   Herminio Commons, MD  ticagrelor (BRILINTA) 60 MG TABS tablet Take 1 tablet (60 mg total) by mouth 2 (two) times daily. 08/17/18   Almyra Deforest, PA     Allergies:     Allergies  Allergen Reactions  . Aspirin Other (See Comments)    High Doses Mess up her stomach; "makes my bowels have blood in them". Takes 81 mg EC Aspirin   . Penicillins Other (See Comments)    SYNCOPE? , "makes me real weak when I take it; like I'll pass out"  Has patient had a PCN reaction causing immediate rash, facial/tongue/throat swelling, SOB or lightheadedness with hypotension: Yes Has patient had a PCN reaction causing severe rash involving mucus membranes or skin necrosis: no Has patient had a PCN reaction that required hospitalization no Has patient had a PCN reaction occurring within the last 10 years: no If all of the above  .  Amlodipine Swelling  . Bactrim [Sulfamethoxazole-Trimethoprim] Rash  . Contrast Media [Iodinated Diagnostic Agents] Itching  . Iron Itching and Other (See Comments)    "they gave me iron in dialysis; had to give me Benadryl cause I had to have the iron" (05/02/2012)  . Nitrofurantoin Hives  . Tylenol [Acetaminophen] Itching and Other (See Comments)    Makes her feet on fire per pt  . Gabapentin Other (See Comments)    Unknown reaction  . Dexilant [Dexlansoprazole] Other (See Comments)    Upset stomach  . Levaquin [Levofloxacin In D5w] Rash  . Morphine And Related Itching and  Other (See Comments)    Itching in feet  . Plavix [Clopidogrel Bisulfate] Rash  . Protonix [Pantoprazole Sodium] Rash  . Venofer [Ferric Oxide] Itching and Other (See Comments)    Patient reports using Benadryl prior to doses as [facility]     Physical Exam:   Vitals  Blood pressure (!) 189/76, pulse 82, temperature 99.1 F (37.3 C), temperature source Oral, resp. rate (!) 22, height 5\' 1"  (1.549 m), weight 59.4 kg, SpO2 96 %.  1.  General: axox3  2. Psychiatric: euthymic  3. Neurologic: cn2-12 intact, reflexes 2+ symmetric, diffuse with no clonus, motor 5/5 in all 4ext  4. HEENMT:  Anicteric, pupils 1.63mm symmetric, direct, consensual, near intact Neck: no jvd,  5. Respiratory : CTAB  6. Cardiovascular : Midline scar, rrr s1, s2, 2/6 sem rusb/apex  7. Gastrointestinal:  Abd: soft, nt, nd, +bs  8. Skin:  Ext: no c/c/e,  + LAVF  9.Musculoskeletal:  Good ROM  No adenopathy    Data Review:    CBC Recent Labs  Lab 12/14/18 0143  WBC 8.3  HGB 11.0*  HCT 34.6*  PLT 153  MCV 109.8*  MCH 34.9*  MCHC 31.8  RDW 15.5  LYMPHSABS 1.1  MONOABS 0.7  EOSABS 0.3  BASOSABS 0.1   ------------------------------------------------------------------------------------------------------------------  Results for orders placed or performed during the hospital encounter of 12/14/18 (from  the past 48 hour(s))  Basic metabolic panel     Status: Abnormal   Collection Time: 12/14/18  1:43 AM  Result Value Ref Range   Sodium 143 135 - 145 mmol/L   Potassium 3.7 3.5 - 5.1 mmol/L   Chloride 98 98 - 111 mmol/L   CO2 29 22 - 32 mmol/L   Glucose, Bld 109 (H) 70 - 99 mg/dL   BUN 37 (H) 8 - 23 mg/dL   Creatinine, Ser 7.62 (H) 0.44 - 1.00 mg/dL   Calcium 11.6 (H) 8.9 - 10.3 mg/dL   GFR calc non Af Amer 5 (L) >60 mL/min   GFR calc Af Amer 5 (L) >60 mL/min   Anion gap 16 (H) 5 - 15    Comment: Performed at [facility], [address], [place], [place] [address]  CBC with Differential     Status: Abnormal   Collection Time: 12/14/18  1:43 AM  Result Value Ref Range   WBC 8.3 4.0 - 10.5 K/uL   RBC 3.15 (L) 3.87 - 5.11 MIL/uL   Hemoglobin 11.0 (L) 12.0 - 15.0 g/dL   HCT 34.6 (L) 36.0 - 46.0 %   MCV 109.8 (H) 80.0 - 100.0 fL   MCH 34.9 (H) 26.0 - 34.0 pg   MCHC 31.8 30.0 - 36.0 g/dL   RDW 15.5 11.5 - 15.5 %   Platelets 153 150 - 400 K/uL   nRBC 0.0 0.0 - 0.2 %   Neutrophils Relative % 73 %   Neutro Abs 6.1 1.7 - 7.7 K/uL   Lymphocytes Relative 13 %   Lymphs Abs 1.1 0.7 - 4.0 K/uL   Monocytes Relative 9 %   Monocytes Absolute 0.7 0.1 - 1.0 K/uL   Eosinophils Relative 4 %   Eosinophils Absolute 0.3 0.0 - 0.5 K/uL   Basophils Relative 1 %   Basophils Absolute 0.1 0.0 - 0.1 K/uL   Immature Granulocytes 0 %   Abs Immature Granulocytes 0.03 0.00 - 0.07 K/uL    Comment: Performed at [facility], [address] [place], [place] [address]  Troponin I - Once  Status: Abnormal   Collection Time: 12/14/18  1:43 AM  Result Value Ref Range   Troponin I 0.07 (HH) <0.03 ng/mL    Comment: CRITICAL RESULT CALLED TO, READ BACK BY AND VERIFIED WITH: J SAPPELT,RN @0314  12/14/18 [facility] Performed at [facility], [address], [place], [place] [address]   Magnesium     Status: None   Collection Time: 12/14/18  1:43 AM  Result Value Ref Range   Magnesium 2.0 1.7 - 2.4 mg/dL     Comment: Performed at [facility], [address], [place], [place] [address]  APTT     Status: None   Collection Time: 12/14/18  1:43 AM  Result Value Ref Range   aPTT 34 24 - 36 seconds    Comment: Performed at [facility], [address], [place], [place] [address]  Protime-INR     Status: None   Collection Time: 12/14/18  1:43 AM  Result Value Ref Range   Prothrombin Time 13.2 11.4 - 15.2 seconds   INR 1.0 0.8 - 1.2    Comment: (NOTE) INR goal varies based on device and disease states. Performed at [facility], [address], [place], [place] [address]   SARS Coronavirus 2 (CEPHEID - Performed in [facility] hospital lab), Hosp Order     Status: None   Collection Time: 12/14/18  1:45 AM  Result Value Ref Range   SARS Coronavirus 2 NEGATIVE NEGATIVE    Comment: (NOTE) If result is NEGATIVE SARS-CoV-2 target nucleic acids are NOT DETECTED. The SARS-CoV-2 RNA is generally detectable in upper and lower  respiratory specimens during the acute phase of infection. The lowest  concentration of SARS-CoV-2 viral copies this assay can detect is 250  copies / mL. A negative result does not preclude SARS-CoV-2 infection  and should not be used as the sole basis for treatment or other  patient management decisions.  A negative result may occur with  improper specimen collection / handling, submission of specimen other  than nasopharyngeal swab, presence of viral mutation(s) within the  areas targeted by this assay, and inadequate number of viral copies  (<250 copies / mL). A negative result must be combined with clinical  observations, patient history, and epidemiological information. If result is POSITIVE SARS-CoV-2 target nucleic acids are DETECTED. The SARS-CoV-2 RNA is generally detectable in upper and lower  respiratory specimens dur ing the acute phase of infection.  Positive  results are indicative of active infection with SARS-CoV-2.  Clinical  correlation with patient history  and other diagnostic information is  necessary to determine patient infection status.  Positive results do  not rule out bacterial infection or co-infection with other viruses. If result is PRESUMPTIVE POSTIVE SARS-CoV-2 nucleic acids MAY BE PRESENT.   A presumptive positive result was obtained on the submitted specimen  and confirmed on repeat testing.  While 2019 novel coronavirus  (SARS-CoV-2) nucleic acids may be present in the submitted sample  additional confirmatory testing may be necessary for epidemiological  and / or clinical management purposes  to differentiate between  SARS-CoV-2 and other Sarbecovirus currently known to infect humans.  If clinically indicated additional testing with an alternate test  methodology [phone]) is advised. The SARS-CoV-2 RNA is generally  detectable in upper and lower respiratory sp ecimens during the acute  phase of infection. The expected result is Negative. Fact Sheet for Patients:  StrictlyIdeas.no Fact Sheet for Healthcare Providers: BankingDealers.co.za This test is not yet approved or cleared by the [place]  FDA and has been authorized for detection and/or diagnosis of SARS-CoV-2 by FDA under an Emergency Use Authorization (EUA).  This EUA will remain in effect (meaning this test can be used) for the duration of the COVID-19 declaration under Section 564(b)(1) of the Act, 21 U.S.C. section 360bbb-3(b)(1), unless the authorization is terminated or revoked sooner. Performed at [facility], [address] [place], [place] [address]     Chemistries  Recent Labs  Lab 12/14/18 0143  NA 143  K 3.7  CL 98  CO2 29  GLUCOSE 109*  BUN 37*  CREATININE 7.62*  CALCIUM 11.6*  MG 2.0   ------------------------------------------------------------------------------------------------------------------   ------------------------------------------------------------------------------------------------------------------ GFR: Estimated Creatinine Clearance: 5 mL/min (A) (by C-G formula based on SCr of 7.62 mg/dL (H)). Liver Function Tests: No results for input(s): AST, ALT, ALKPHOS, BILITOT, PROT, ALBUMIN in the last 168 hours. No results for input(s): LIPASE, AMYLASE in the last 168 hours. No results for input(s): AMMONIA in the last 168 hours. Coagulation Profile: Recent Labs  Lab 12/14/18 0143  INR 1.0   Cardiac Enzymes: Recent Labs  Lab 12/14/18 0143  TROPONINI 0.07*   BNP (last 3 results) No results for input(s): PROBNP in the last 8760 hours. HbA1C: No results for input(s): HGBA1C in the last 72 hours. CBG: No results for input(s): GLUCAP in the last 168 hours. Lipid Profile: No results for input(s): CHOL, HDL, LDLCALC, TRIG, CHOLHDL, LDLDIRECT in the last 72 hours. Thyroid Function Tests: No results for input(s): TSH, T4TOTAL, FREET4, T3FREE, THYROIDAB in the last 72 hours. Anemia Panel: No results for input(s): VITAMINB12, FOLATE, FERRITIN, TIBC, IRON, RETICCTPCT in the last 72 hours.  --------------------------------------------------------------------------------------------------------------- Urine analysis:    Component Value Date/Time   COLORURINE YELLOW 12/16/2012 1919   APPEARANCEUR CLOUDY (A) 12/16/2012 1919   LABSPEC 1.009 12/16/2012 1919   PHURINE 7.5 12/16/2012 1919   GLUCOSEU NEGATIVE 12/16/2012 1919   HGBUR TRACE (A) 12/16/2012 1919   BILIRUBINUR NEGATIVE 12/16/2012 1919   KETONESUR NEGATIVE 12/16/2012 1919   PROTEINUR 100 (A) 12/16/2012 1919   UROBILINOGEN 0.2 12/16/2012 1919   NITRITE NEGATIVE 12/16/2012 1919   LEUKOCYTESUR SMALL (A) 12/16/2012 1919      Imaging Results:    Dg Chest Port 1 View  Result Date: 12/14/2018 CLINICAL DATA:  Left chest pain EXAM: PORTABLE CHEST 1 VIEW COMPARISON:  10/02/2018 FINDINGS: The heart size is enlarged. The  patient is status post prior median sternotomy. There is volume overload with small bilateral pleural effusions. There are emphysematous changes bilaterally. No pneumothorax. No large area of consolidation. There is likely atelectasis versus scarring at the lung bases. IMPRESSION: Cardiomegaly with volume overload and findings suspicious for developing trace to small pleural effusions. Electronically Signed   By: Constance Holster M.D.   On: 12/14/2018 02:21       Assessment & Plan:    Principal Problem:   Ischemic chest pain ([place]) Active Problems:   ESRD on hemodialysis ([place])   Unstable angina ([place])   Hypertensive urgency   PAF (paroxysmal atrial fibrillation) (H[facility])   Elevated troponin  Chest pain, unstable angina Hypertensive emergency Tele Trop I q6h x3 Check cardiac echo NPO except for medication Cont Carvedilol 6.25mg  po bid Cont Brilinta 60mg  po bid (Aspirin and Plavix allergy) Cont Simvastatin 20mg  po qhs Metoprolol 5mg  iv x1 Hydralazine 10mg  iv q6h prn sbp >160 DC imdur/ NTP Nitro GTT Heparin GTT Cardiology consult placed in computer, appreciate input  Pafib/NSVT(per Dorris Carnes 07/13/2018 note),  MR (mild), CAD s/p CABG, multiple  DES, h/o diastolic CHF Not on anticoagulation due to hx of GI bleed Cont Amiodarone 200mg  po qday   ESRD on HD (M,W,F) Cont Sensipar 30mg  po qday Cont Sevalamer 2400mg  po bid Nephrology consult placed in computer, appreciate input Needs dialysis today.   Gerd  Cont Prilosec 20mg  po qday (note allergy to protonix)  H/o Iron def anemia Cont Ferrous sulfate 325mg  po qday Cont Epogen as outpatient  Anxiety Cont Xanax 0.5mg  po qday prn   Allerghic rhinitis Cont Claritin 10mg  po qday   DVT Prophylaxis-   Heparin SCDs   AM Labs Ordered, also please review Full Orders  Family Communication: Admission, patients condition and plan of care including tests being ordered have been discussed with the patient  who indicate understanding and  agree with the plan and Code Status.  Code Status:  FULL CODE  Admission status: Inpatient: Based on patients clinical presentation and evaluation of above clinical data, I have made determination that patient meets Inpatient criteria at this time.  Pt has unstable angina and hypertensive emergency,  Pt requiring iv heparin as well as iv nitroglycerin,  Pt will need close monitoring and titration of bp medication. This will require > 2nights stay, ie inpatient stay.   Time spent in minutes : 70 minutes   Jani Gravel M.D on 12/14/2018 at 4:15 AM

## 2018-12-14 NOTE — Progress Notes (Signed)
ANTICOAGULATION CONSULT NOTE -   Pharmacy Consult for heparin Indication: chest pain/ACS  Allergies  Allergen Reactions  . Aspirin Other (See Comments)    High Doses Mess up her stomach; "makes my bowels have blood in them". Takes 81 mg EC Aspirin   . Penicillins Other (See Comments)    SYNCOPE? , "makes me real weak when I take it; like I'll pass out"  Has patient had a PCN reaction causing immediate rash, facial/tongue/throat swelling, SOB or lightheadedness with hypotension: Yes Has patient had a PCN reaction causing severe rash involving mucus membranes or skin necrosis: no Has patient had a PCN reaction that required hospitalization no Has patient had a PCN reaction occurring within the last 10 years: no If all of the above  . Amlodipine Swelling  . Bactrim [Sulfamethoxazole-Trimethoprim] Rash  . Contrast Media [Iodinated Diagnostic Agents] Itching  . Iron Itching and Other (See Comments)    "they gave me iron in dialysis; had to give me Benadryl cause I had to have the iron" (05/02/2012)  . Nitrofurantoin Hives  . Tylenol [Acetaminophen] Itching and Other (See Comments)    Makes her feet on fire per pt  . Gabapentin Other (See Comments)    Unknown reaction  . Dexilant [Dexlansoprazole] Other (See Comments)    Upset stomach  . Levaquin [Levofloxacin In D5w] Rash  . Morphine And Related Itching and Other (See Comments)    Itching in feet  . Plavix [Clopidogrel Bisulfate] Rash  . Protonix [Pantoprazole Sodium] Rash  . Venofer [Ferric Oxide] Itching and Other (See Comments)    Patient reports using Benadryl prior to doses as [place]    Patient Measurements: Height: 5\' 1"  (154.9 cm) Weight: 131 lb (59.4 kg) IBW/kg (Calculated) : 47.8 HEPARIN DW (KG): 59.4   Vital Signs: Temp: 99.1 F (37.3 C) (06/05 0127) Temp Source: Oral (06/05 0127) BP: 178/73 (06/05 0900) Pulse Rate: 72 (06/05 0600)  Labs: Recent Labs    12/14/18 0143 12/14/18 0459 12/14/18 0941   HGB 11.0* 10.5*  --   HCT 34.6* 34.3*  --   PLT 153 133*  --   APTT 34  --   --   LABPROT 13.2  --   --   INR 1.0  --   --   HEPARINUNFRC  --   --  0.64  CREATININE 7.62* 7.93*  --   TROPONINI 0.07* 0.11*  --    Estimated Creatinine Clearance: 4.8 mL/min (A) (by C-G formula based on SCr of 7.93 mg/dL (H)).  Medical History: Past Medical History:  Diagnosis Date  . Acute on chronic respiratory failure with hypoxia ([place]) 10/10/2016  . Anxiety   . Arthritis   . AVM (arteriovenous malformation) of colon   . CAD (coronary artery disease)    a. s/p CABG in 2013 b. most recent DES to D1 in 10/2016 with cath showing patent LIMA-LAD, SVG-RI, SVG-Mrg and SVG-RCA  c. NST in 02/2018 showing scar with no significant ischemia  . Carotid artery disease ([place])    a. 87-86% LICA, 01/6719   . Chronic bronchitis ([place])   . Chronic diastolic CHF (congestive heart failure) ([place])    a. 02/2012 Echo EF 60-65%, nl wall motion, Gr 1 DD, mod MR  . Colon cancer ([place]) 1992  . Esophageal stricture   . ESRD on hemodialysis ([place])    ESRD due to HTN, started dialysis 2011 and gets HD at [facility] with Dr Hinda Lenis on MWF schedule.  Access is LUA  AVF as of Sept 2014.   Marland Kitchen GERD (gastroesophageal reflux disease)   . High cholesterol 12/2011  . History of blood transfusion 07/2011; 12/2011; 01/2012 X 2; 04/2012  . History of gout   . History of lower GI bleeding   . Hypertension   . Iron deficiency anemia   . Mitral regurgitation    a. Moderate by echo, 02/2012  . Myocardial infarction ([place])   . Ovarian cancer ([place]) 1992  . Pneumonia ~ 2009  . PUD (peptic ulcer disease)   . TIA (transient ischemic attack)     Medications:  (Not in a hospital admission)   Assessment: 78 yo presented with chest pain.  Hx CAD, most recent cardiac cath in January 2020.  Pharmacy asked to start heparin gtt.    Goal of Therapy:  Heparin level 0.3-0.7 units/ml  Monitor platelets per protocol   Plan:  Continue heparin  infusion at 700 units/hr Check anti-Xa level in 6 hours and daily while on heparin Continue to monitor H&H and platelets   Ramond Craver, RPH 12/14/2018,10:20 AM

## 2018-12-14 NOTE — ED Notes (Signed)
Date and time results received: 12/14/18 0611   Test: Troponin Critical Value: 0.11  Name of Provider Notified: Maudie Mercury, MD

## 2018-12-14 NOTE — ED Notes (Signed)
Patient complains of bilateral lower extremity itching and nausea after receiving hydralazine 10 mg IV. Patient states, "I don't feel good."

## 2018-12-14 NOTE — ED Notes (Signed)
Nitro Paste Patch Removed when [employer] Began. Pt's clean cleaned where Nitro Paste was present.

## 2018-12-15 LAB — BASIC METABOLIC PANEL
Anion gap: 12 (ref 5–15)
BUN: 19 mg/dL (ref 8–23)
CO2: 29 mmol/L (ref 22–32)
Calcium: 10.5 mg/dL — ABNORMAL HIGH (ref 8.9–10.3)
Chloride: 96 mmol/L — ABNORMAL LOW (ref 98–111)
Creatinine, Ser: 4.75 mg/dL — ABNORMAL HIGH (ref 0.44–1.00)
GFR calc Af Amer: 9 mL/min — ABNORMAL LOW (ref >60)
GFR calc non Af Amer: 8 mL/min — ABNORMAL LOW (ref >60)
Glucose, Bld: 84 mg/dL (ref 70–99)
Potassium: 3.8 mmol/L (ref 3.5–5.1)
Sodium: 137 mmol/L (ref 135–145)

## 2018-12-15 LAB — HEPARIN LEVEL (UNFRACTIONATED): Heparin Unfractionated: 0.37 IU/mL (ref 0.30–0.70)

## 2018-12-15 LAB — CBC
HCT: 27.6 % — ABNORMAL LOW (ref 36.0–46.0)
Hemoglobin: 8.5 g/dL — ABNORMAL LOW (ref 12.0–15.0)
MCH: 34 pg (ref 26.0–34.0)
MCHC: 30.8 g/dL (ref 30.0–36.0)
MCV: 110.4 fL — ABNORMAL HIGH (ref 80.0–100.0)
Platelets: 131 10*3/uL — ABNORMAL LOW (ref 150–400)
RBC: 2.5 MIL/uL — ABNORMAL LOW (ref 3.87–5.11)
RDW: 15.6 % — ABNORMAL HIGH (ref 11.5–15.5)
WBC: 6.2 10*3/uL (ref 4.0–10.5)
nRBC: 0 % (ref 0.0–0.2)

## 2018-12-15 MED ORDER — ISOSORBIDE MONONITRATE ER 60 MG PO TB24: 60.0000 mg | ORAL_TABLET | Freq: Two times a day (BID) | ORAL | 3 refills | Status: DC

## 2018-12-15 NOTE — Progress Notes (Signed)
Patient ID: Shawna [name], female   DOB: 08/30/1940, 78 y.o.   MRN: [id number]  [facility] Progress Note   Assessment/ Plan:   1. Chest pain/unstable angina: Medical management noted for severe multivessel disease with occlusion of previously placed stents noted increasing complexity of her management.  Currently free from chest pain and recommended continued efforts at compliance with ongoing management/sodium restriction/dialysis.    2. ESRD MWF at [facility]:  Underwent hemodialysis yesterday with ultrafiltration that was limited by symptomatic hypotension and therefore stopped at 2.1 L.  She did complete her entire dialysis time.  Continue efforts at outpatient lowering of dry weight.  She appears to be clinically stable to discharge home today from a renal standpoint.  3. Hypertension: Blood pressure elevated at this time, resume antihypertensive therapy and monitor with dialysis/UF.  4. Anemia of ESRD: Hemoglobin low, continue to monitor trend and adjust ESA as an outpatient  5. Metabolic Bone Disease:  Hyperphosphatemia noted, monitor as an outpatient while on binders/Sensipar.  Subjective:   Reports to be feeling better without any chest pain.  She previously was dyspneic with conversation and this has improved since dialysis.   Objective:   BP (!) 178/68   Pulse 76   Temp 98.5 F (36.9 C) (Oral)   Resp (!) 24   Ht 5\' 1"  (1.549 m)   Wt 61.4 kg   SpO2 100%   BMI 25.58 kg/m   Physical Exam: Gen: Comfortably resting in bed CVS: Pulse regular rhythm, normal rate, S1 and S2 normal Resp: Clear to auscultation, no rales/rhonchi Abd: Soft, obese, nontender Ext: No lower extremity edema.  Aneurysmal left brachiocephalic fistula.  Labs: BMET Recent Labs  Lab 12/14/18 0143 12/14/18 0459 12/14/18 1611 12/15/18 0403  NA 143 142 140 137  K 3.7 4.1 4.9 3.8  CL 98 101 99 96*  CO2 29 27 26 29   GLUCOSE 109* 94 103* 84  BUN 37* 41* 54* 19  CREATININE 7.62* 7.93*  9.20* 4.75*  CALCIUM 11.6* 11.2* 10.6* 10.5*  PHOS  --   --  5.8*  --    CBC Recent Labs  Lab 12/14/18 0143 12/14/18 0459 12/14/18 1611 12/15/18 0403  WBC 8.3 8.1 9.0 6.2  NEUTROABS 6.1  --   --   --   HGB 11.0* 10.5* 9.4* 8.5*  HCT 34.6* 34.3* 30.0* 27.6*  MCV 109.8* 114.0* 111.1* 110.4*  PLT 153 133* 152 131*   Medications:    . amiodarone  200 mg Oral Daily  . b complex-vitamin c-folic acid  1 tablet Oral Daily  . carvedilol  6.25 mg Oral BID WC  . Chlorhexidine Gluconate Cloth  6 each Topical Q0600  . cinacalcet  30 mg Oral QPC supper  . isosorbide mononitrate  60 mg Oral Daily  . omeprazole  20 mg Oral Daily  . sevelamer carbonate  2,400 mg Oral BID WC  . sevelamer carbonate  800 mg Oral With snacks  . simvastatin  20 mg Oral q1800  . sodium chloride flush  3 mL Intravenous Q12H  . ticagrelor  60 mg Oral BID   Elmarie Shiley, MD 12/15/2018, 1:39 PM

## 2018-12-15 NOTE — Discharge Summary (Signed)
Physician Discharge Summary  PERNELL DIKES IDP:[id number] DOB: [birth year] DOA: 12/14/2018  PCP: [name], MD  Admit date: 12/14/2018  Discharge date: 12/15/2018  Admitted From:Home  Disposition:  Home  Recommendations for Outpatient Follow-up:  1. Follow up with PCP in 1-2 weeks 2. Follow-up with cardiologist Dr. Bronson Ing in 2 weeks 3. Continue now on Imdur 60 mg twice daily as recommended by cardiology 4. 2D echocardiogram reviewed with LVEF 60 to 65% and no wall motion abnormalities detected 5. Per cardiology recommendations, increase Coreg dose as needed to manage blood pressure Home Health: None  Equipment/Devices: None  Discharge Condition: Stable  CODE STATUS: Full  Diet recommendation: Heart Healthy  Brief/Interim Summary: Per HPI: [name] a72 y.o.female,w Gerd/ esophageal stricture,h/o LGIB (avm), remote h/o colon cancer, hypertension, hyperlipidemia, Carotid stenosis, TIA, ESRD on HD, (M,W,F), L AVF, Pafib/NSVT, MR(mild), CAD s/p CABG 06/13/2012, s/p DES prox Lcx, and D1 12/15/2011, NSTEMI w DES D1 04/5/2018with recent cath 07/13/2018 => EF 55-65%, 1. Severe 3 vessel occlusive CAD - 60% distal left main - 90% mid LAD - 100% first diagonal at prior stent site - 99% proximal LCx at prior stent site - 80% ramus intermediate. - 100% ostial RCA at prior stent site - 75% proximal PDA 2. Patent LIMA to the LAD 3. Patent SVG to ramus intermediate 4. Patent SVG to OM 5. Patent SVG to the distal RCA. 6. Good LV function 7. Mildly elevated LVEDP  Plan: Compared to prior cath in April 2018 the diagonal is now occluded. There is progression of disease in the PDA. It is unclear if this is the reason for her chest pain. She is severely hypertensive and EDP is mildly elevated. I would recommend medical management with good BP control. If she has refractory angina despite good BP and volume management we could consider PCI of the PDA but this  would have to be done through the SVG and in general she has not responded well to stenting in the past with all prior stents occluded. I will increase her Coreg dose.  C/osubsternalchest pressure w radiation to left shoulderstarting about 5:30 pm, Relief with nitro, and then returned and therefore presented to ED. Chest pain associated with slight sobas well as nausea.Used a breathing treatment without relief as well. Pt notes that has had intermittent chest pain over the past several days.  Culprit lesion thought to be progression of disease in prox PDA, medical management with good BP control was suggested at that time as above.  Pt denies fever, chills, cough, emesis, abd pain, diarrhea, brbpr.Pt states that she may not have had full dialysis treatment due to her bp being low on this past Wednesday. Pt noted to be hypertensive. Pt denies noncompliance with medication. Pt presented to ED for chest pain.  She was noted to have a flat troponin trend and no further chest pain after she was started on her IV nitroglycerin.  She was transitioned to oral Imdur and was apparently not taking this medication at home due to some side effects of hair loss and some weight loss as well.  She is seen by nephrology and underwent hemodialysis on 6/5 with approximately 2.1 L of fluid removed.  She was noted to be somewhat symptomatic during the course of hemodialysis and therefore the session was cut short.  Fortunately, she has no significant dyspnea or swelling at this time and is otherwise stable for discharge to continue her usual hemodialysis on 6/8.  Her heparin drip was discontinued early in the  morning of 6/6 and she had no significant 2D echocardiogram changes or EKG changes.  Troponin trend had remained flat.  She continues to have some mildly elevated blood pressure readings for which cardiology has recommended increasing Coreg as needed.  Discharge Diagnoses:  Principal Problem:   Ischemic  chest pain ([place]) Active Problems:   ESRD on hemodialysis ([place])   Unstable angina ([place])   Hypertensive urgency   PAF (paroxysmal atrial fibrillation) ([facility])   Elevated troponin  Principal discharge diagnosis: Unstable angina in the setting of multivessel CAD.  Discharge Instructions  Discharge Instructions    Diet - low sodium heart healthy   Complete by:  As directed    Increase activity slowly   Complete by:  As directed      Allergies as of 12/15/2018      Reactions   Aspirin Other (See Comments)   High Doses Mess up her stomach; "makes my bowels have blood in them". Takes 81 mg EC Aspirin    Penicillins Other (See Comments)   SYNCOPE? , "makes me real weak when I take it; like I'll pass out" Has patient had a PCN reaction causing immediate rash, facial/tongue/throat swelling, SOB or lightheadedness with hypotension: Yes Has patient had a PCN reaction causing severe rash involving mucus membranes or skin necrosis: no Has patient had a PCN reaction that required hospitalization no Has patient had a PCN reaction occurring within the last 10 years: no If all of the above   Amlodipine Swelling   Bactrim [sulfamethoxazole-trimethoprim] Rash   Contrast Media [iodinated Diagnostic Agents] Itching   Iron Itching, Other (See Comments)   "they gave me iron in dialysis; had to give me Benadryl cause I had to have the iron" (05/02/2012)   Nitrofurantoin Hives   Tylenol [acetaminophen] Itching, Other (See Comments)   Makes her feet on fire per pt   Gabapentin Other (See Comments)   Unknown reaction   Hydralazine Itching   Dexilant [dexlansoprazole] Other (See Comments)   Upset stomach   Levaquin [levofloxacin In D5w] Rash   Morphine And Related Itching, Other (See Comments)   Itching in feet   Plavix [clopidogrel Bisulfate] Rash   Protonix [pantoprazole Sodium] Rash   Venofer [ferric Oxide] Itching, Other (See Comments)   Patient reports using Benadryl prior to doses as [place]      Medication List    TAKE these medications   ALPRAZolam 0.5 MG tablet Commonly known as:  XANAX Take 0.5 mg by mouth daily.   amiodarone 200 MG tablet Commonly known as:  PACERONE Take 1 tablet (200 mg total) by mouth daily.   bisacodyl 10 MG suppository Commonly known as:  DULCOLAX Place 1 suppository (10 mg total) rectally as needed for moderate constipation.   carvedilol 6.25 MG tablet Commonly known as:  COREG Take 6.25 mg by mouth 2 (two) times daily with a meal.   cinacalcet 30 MG tablet Commonly known as:  SENSIPAR Take 30 mg by mouth daily after supper.   diphenhydrAMINE 25 mg capsule Commonly known as:  BENADRYL Take 50 mg by mouth daily as needed for itching or allergies.   fluticasone 50 MCG/ACT nasal spray Commonly known as:  FLONASE Place 1 spray at bedtime as needed into both nostrils for allergies.   isosorbide mononitrate 60 MG 24 hr tablet Commonly known as:  IMDUR Take 1 tablet (60 mg total) by mouth 2 (two) times a day for 30 days. What changed:    medication strength  how much to take  when to take this   lidocaine-prilocaine cream Commonly known as:  EMLA Apply 1 application topically every Monday, Wednesday, and Friday. Prior to dialysis   loratadine 10 MG tablet Commonly known as:  CLARITIN Take 1 tablet (10 mg total) by mouth daily as needed for allergies.   multivitamin Tabs tablet Take 1 tablet by mouth daily.   nitroGLYCERIN 0.4 MG SL tablet Commonly known as:  NITROSTAT DISSOLVE ONE TABLET UNDER THE TONGUE EVERY 5 MINUTES AS NEEDED FOR CHEST PAIN.  DO NOT EXCEED A TOTAL OF 3 DOSES IN 15 MINUTES What changed:  See the new instructions.   omeprazole 20 MG capsule Commonly known as:  PRILOSEC Take 1 capsule (20 mg total) by mouth daily.   sevelamer carbonate 800 MG tablet Commonly known as:  RENVELA Take 800-2,400 mg by mouth See admin instructions. Take 3 tablets (2400) by mouth twice daily with meals, take 1  tablet (800 mg) with snacks   simvastatin 20 MG tablet Commonly known as:  ZOCOR TAKE ONE TABLET (20 MG) BY MOUTH DAILY AT BEDTIME What changed:    how much to take  how to take this  when to take this   ticagrelor 60 MG Tabs tablet Commonly known as:  Brilinta Take 1 tablet (60 mg total) by mouth 2 (two) times daily.      Follow-up Information    [name], MD Follow up in 1 week(s).   Specialty:  Family Medicine Contact information: [place] [address] [phone]        Herminio Commons, MD Follow up in 1 week(s).   Specialty:  Cardiology Contact information: [place] [address] [phone]          Allergies  Allergen Reactions  . Aspirin Other (See Comments)    High Doses Mess up her stomach; "makes my bowels have blood in them". Takes 81 mg EC Aspirin   . Penicillins Other (See Comments)    SYNCOPE? , "makes me real weak when I take it; like I'll pass out"  Has patient had a PCN reaction causing immediate rash, facial/tongue/throat swelling, SOB or lightheadedness with hypotension: Yes Has patient had a PCN reaction causing severe rash involving mucus membranes or skin necrosis: no Has patient had a PCN reaction that required hospitalization no Has patient had a PCN reaction occurring within the last 10 years: no If all of the above  . Amlodipine Swelling  . Bactrim [Sulfamethoxazole-Trimethoprim] Rash  . Contrast Media [Iodinated Diagnostic Agents] Itching  . Iron Itching and Other (See Comments)    "they gave me iron in dialysis; had to give me Benadryl cause I had to have the iron" (05/02/2012)  . Nitrofurantoin Hives  . Tylenol [Acetaminophen] Itching and Other (See Comments)    Makes her feet on fire per pt  . Gabapentin Other (See Comments)    Unknown reaction  . Hydralazine Itching  . Dexilant [Dexlansoprazole] Other (See Comments)    Upset stomach  . Levaquin [Levofloxacin In D5w] Rash  . Morphine And  Related Itching and Other (See Comments)    Itching in feet  . Plavix [Clopidogrel Bisulfate] Rash  . Protonix [Pantoprazole Sodium] Rash  . Venofer [Ferric Oxide] Itching and Other (See Comments)    Patient reports using Benadryl prior to doses as [place]    Consultations:  Cardiology  Nephrology   Procedures/Studies: Dg Chest Port 1 View  Result Date: 12/14/2018  CLINICAL DATA:  Left chest pain EXAM: PORTABLE CHEST 1 VIEW COMPARISON:  10/02/2018 FINDINGS: The heart size is enlarged. The patient is status post prior median sternotomy. There is volume overload with small bilateral pleural effusions. There are emphysematous changes bilaterally. No pneumothorax. No large area of consolidation. There is likely atelectasis versus scarring at the lung bases. IMPRESSION: Cardiomegaly with volume overload and findings suspicious for developing trace to small pleural effusions. Electronically Signed   By: Constance Holster M.D.   On: 12/14/2018 02:21     Discharge Exam: Vitals:   12/15/18 1215 12/15/18 1245  BP: (!) 160/62 (!) 178/68  Pulse: 71 76  Resp: (!) 23 (!) 24  Temp:    SpO2: 98% 100%   Vitals:   12/15/18 1122 12/15/18 1200 12/15/18 1215 12/15/18 1245  BP:  (!) 162/61 (!) 160/62 (!) 178/68  Pulse: 72 74 71 76  Resp: (!) 22 19 (!) 23 (!) 24  Temp: 98.5 F (36.9 C)     TempSrc: Oral     SpO2: 99% 97% 98% 100%  Weight:      Height:        General: Pt is alert, awake, not in acute distress Cardiovascular: RRR, S1/S2 +, no rubs, no gallops Respiratory: CTA bilaterally, no wheezing, no rhonchi Abdominal: Soft, NT, ND, bowel sounds + Extremities: no edema, no cyanosis    The results of significant diagnostics from this hospitalization (including imaging, microbiology, ancillary and laboratory) are listed below for reference.     Microbiology: Recent Results (from the past 240 hour(s))  SARS Coronavirus 2 (CEPHEID - Performed in [place] hospital lab), Hosp  Order     Status: None   Collection Time: 12/14/18  1:45 AM  Result Value Ref Range Status   SARS Coronavirus 2 NEGATIVE NEGATIVE Final    Comment: (NOTE) If result is NEGATIVE SARS-CoV-2 target nucleic acids are NOT DETECTED. The SARS-CoV-2 RNA is generally detectable in upper and lower  respiratory specimens during the acute phase of infection. The lowest  concentration of SARS-CoV-2 viral copies this assay can detect is 250  copies / mL. A negative result does not preclude SARS-CoV-2 infection  and should not be used as the sole basis for treatment or other  patient management decisions.  A negative result may occur with  improper specimen collection / handling, submission of specimen other  than nasopharyngeal swab, presence of viral mutation(s) within the  areas targeted by this assay, and inadequate number of viral copies  (<250 copies / mL). A negative result must be combined with clinical  observations, patient history, and epidemiological information. If result is POSITIVE SARS-CoV-2 target nucleic acids are DETECTED. The SARS-CoV-2 RNA is generally detectable in upper and lower  respiratory specimens dur ing the acute phase of infection.  Positive  results are indicative of active infection with SARS-CoV-2.  Clinical  correlation with patient history and other diagnostic information is  necessary to determine patient infection status.  Positive results do  not rule out bacterial infection or co-infection with other viruses. If result is PRESUMPTIVE POSTIVE SARS-CoV-2 nucleic acids MAY BE PRESENT.   A presumptive positive result was obtained on the submitted specimen  and confirmed on repeat testing.  While 2019 novel coronavirus  (SARS-CoV-2) nucleic acids may be present in the submitted sample  additional confirmatory testing may be necessary for epidemiological  and / or clinical management purposes  to differentiate between  SARS-CoV-2 and other Sarbecovirus currently  known to infect humans.  If clinically indicated additional testing with an alternate test  methodology [phone]) is advised. The SARS-CoV-2 RNA is generally  detectable in upper and lower respiratory sp ecimens during the acute  phase of infection. The expected result is Negative. Fact Sheet for Patients:  StrictlyIdeas.no Fact Sheet for Healthcare Providers: BankingDealers.co.za This test is not yet approved or cleared by the [place] FDA and has been authorized for detection and/or diagnosis of SARS-CoV-2 by FDA under an Emergency Use Authorization (EUA).  This EUA will remain in effect (meaning this test can be used) for the duration of the COVID-19 declaration under Section 564(b)(1) of the Act, 21 U.S.C. section 360bbb-3(b)(1), unless the authorization is terminated or revoked sooner. Performed at [facility], [address], [place], [place] [address]   MRSA PCR Screening     Status: None   Collection Time: 12/14/18  6:13 PM  Result Value Ref Range Status   MRSA by PCR NEGATIVE NEGATIVE Final    Comment:        The GeneXpert MRSA Assay (FDA approved for NASAL specimens only), is one component of a comprehensive MRSA colonization surveillance program. It is not intended to diagnose MRSA infection nor to guide or monitor treatment for MRSA infections. Performed at [facility], [address], [place], [place] [address]      Labs: BNP (last 3 results) No results for input(s): BNP in the last 8760 hours. Basic Metabolic Panel: Recent Labs  Lab 12/14/18 0143 12/14/18 0459 12/14/18 1611 12/15/18 0403  NA 143 142 140 137  K 3.7 4.1 4.9 3.8  CL 98 101 99 96*  CO2 29 27 26 29   GLUCOSE 109* 94 103* 84  BUN 37* 41* 54* 19  CREATININE 7.62* 7.93* 9.20* 4.75*  CALCIUM 11.6* 11.2* 10.6* 10.5*  MG 2.0  --   --   --   PHOS  --   --  5.8*  --    Liver Function Tests: Recent Labs  Lab 12/14/18 0459 12/14/18 1611   AST 10*  --   ALT 10  --   ALKPHOS 125  --   BILITOT 0.6  --   PROT 6.4*  --   ALBUMIN 3.6 3.4*   No results for input(s): LIPASE, AMYLASE in the last 168 hours. No results for input(s): AMMONIA in the last 168 hours. CBC: Recent Labs  Lab 12/14/18 0143 12/14/18 0459 12/14/18 1611 12/15/18 0403  WBC 8.3 8.1 9.0 6.2  NEUTROABS 6.1  --   --   --   HGB 11.0* 10.5* 9.4* 8.5*  HCT 34.6* 34.3* 30.0* 27.6*  MCV 109.8* 114.0* 111.1* 110.4*  PLT 153 133* 152 131*   Cardiac Enzymes: Recent Labs  Lab 12/14/18 0143 12/14/18 0459 12/14/18 1124 12/14/18 1611  TROPONINI 0.07* 0.11* 0.11* 0.09*   BNP: Invalid input(s): POCBNP CBG: No results for input(s): GLUCAP in the last 168 hours. D-Dimer No results for input(s): DDIMER in the last 72 hours. Hgb A1c No results for input(s): HGBA1C in the last 72 hours. Lipid Profile No results for input(s): CHOL, HDL, LDLCALC, TRIG, CHOLHDL, LDLDIRECT in the last 72 hours. Thyroid function studies No results for input(s): TSH, T4TOTAL, T3FREE, THYROIDAB in the last 72 hours.  Invalid input(s): FREET3 Anemia work up No results for input(s): VITAMINB12, FOLATE, FERRITIN, TIBC, IRON, RETICCTPCT in the last 72 hours. Urinalysis    Component Value Date/Time   COLORURINE YELLOW 12/16/2012 1919   APPEARANCEUR CLOUDY (A) 12/16/2012 1919   LABSPEC 1.009 12/16/2012 1919  PHURINE 7.5 12/16/2012 1919   GLUCOSEU NEGATIVE 12/16/2012 1919   HGBUR TRACE (A) 12/16/2012 1919   BILIRUBINUR NEGATIVE 12/16/2012 1919   KETONESUR NEGATIVE 12/16/2012 1919   PROTEINUR 100 (A) 12/16/2012 1919   UROBILINOGEN 0.2 12/16/2012 1919   NITRITE NEGATIVE 12/16/2012 1919   LEUKOCYTESUR SMALL (A) 12/16/2012 1919   Sepsis Labs Invalid input(s): PROCALCITONIN,  WBC,  LACTICIDVEN Microbiology Recent Results (from the past 240 hour(s))  SARS Coronavirus 2 (CEPHEID - Performed in [place] hospital lab), Hosp Order     Status: None   Collection Time: 12/14/18   1:45 AM  Result Value Ref Range Status   SARS Coronavirus 2 NEGATIVE NEGATIVE Final    Comment: (NOTE) If result is NEGATIVE SARS-CoV-2 target nucleic acids are NOT DETECTED. The SARS-CoV-2 RNA is generally detectable in upper and lower  respiratory specimens during the acute phase of infection. The lowest  concentration of SARS-CoV-2 viral copies this assay can detect is 250  copies / mL. A negative result does not preclude SARS-CoV-2 infection  and should not be used as the sole basis for treatment or other  patient management decisions.  A negative result may occur with  improper specimen collection / handling, submission of specimen other  than nasopharyngeal swab, presence of viral mutation(s) within the  areas targeted by this assay, and inadequate number of viral copies  (<250 copies / mL). A negative result must be combined with clinical  observations, patient history, and epidemiological information. If result is POSITIVE SARS-CoV-2 target nucleic acids are DETECTED. The SARS-CoV-2 RNA is generally detectable in upper and lower  respiratory specimens dur ing the acute phase of infection.  Positive  results are indicative of active infection with SARS-CoV-2.  Clinical  correlation with patient history and other diagnostic information is  necessary to determine patient infection status.  Positive results do  not rule out bacterial infection or co-infection with other viruses. If result is PRESUMPTIVE POSTIVE SARS-CoV-2 nucleic acids MAY BE PRESENT.   A presumptive positive result was obtained on the submitted specimen  and confirmed on repeat testing.  While 2019 novel coronavirus  (SARS-CoV-2) nucleic acids may be present in the submitted sample  additional confirmatory testing may be necessary for epidemiological  and / or clinical management purposes  to differentiate between  SARS-CoV-2 and other Sarbecovirus currently known to infect humans.  If clinically indicated  additional testing with an alternate test  methodology [phone]) is advised. The SARS-CoV-2 RNA is generally  detectable in upper and lower respiratory sp ecimens during the acute  phase of infection. The expected result is Negative. Fact Sheet for Patients:  StrictlyIdeas.no Fact Sheet for Healthcare Providers: BankingDealers.co.za This test is not yet approved or cleared by the [place] FDA and has been authorized for detection and/or diagnosis of SARS-CoV-2 by FDA under an Emergency Use Authorization (EUA).  This EUA will remain in effect (meaning this test can be used) for the duration of the COVID-19 declaration under Section 564(b)(1) of the Act, 21 U.S.C. section 360bbb-3(b)(1), unless the authorization is terminated or revoked sooner. Performed at [facility], [address] [place], [place] [address]   MRSA PCR Screening     Status: None   Collection Time: 12/14/18  6:13 PM  Result Value Ref Range Status   MRSA by PCR NEGATIVE NEGATIVE Final    Comment:        The GeneXpert MRSA Assay (FDA approved for NASAL specimens only), is one component of a comprehensive MRSA  colonization surveillance program. It is not intended to diagnose MRSA infection nor to guide or monitor treatment for MRSA infections. Performed at [facility], [address] [place], [place] [address]      Time coordinating discharge: 35 minutes  SIGNED:   Rodena Goldmann, DO [facility] 12/15/2018, 1:55 PM  If 7PM-7AM, please contact night-coverage [email or web address] Password [email or web address]

## 2018-12-15 NOTE — Progress Notes (Signed)
ANTICOAGULATION CONSULT NOTE -   Pharmacy Consult for heparin Indication: chest pain/ACS  Allergies  Allergen Reactions  . Aspirin Other (See Comments)    High Doses Mess up her stomach; "makes my bowels have blood in them". Takes 81 mg EC Aspirin   . Penicillins Other (See Comments)    SYNCOPE? , "makes me real weak when I take it; like I'll pass out"  Has patient had a PCN reaction causing immediate rash, facial/tongue/throat swelling, SOB or lightheadedness with hypotension: Yes Has patient had a PCN reaction causing severe rash involving mucus membranes or skin necrosis: no Has patient had a PCN reaction that required hospitalization no Has patient had a PCN reaction occurring within the last 10 years: no If all of the above  . Amlodipine Swelling  . Bactrim [Sulfamethoxazole-Trimethoprim] Rash  . Contrast Media [Iodinated Diagnostic Agents] Itching  . Iron Itching and Other (See Comments)    "they gave me iron in dialysis; had to give me Benadryl cause I had to have the iron" (05/02/2012)  . Nitrofurantoin Hives  . Tylenol [Acetaminophen] Itching and Other (See Comments)    Makes her feet on fire per pt  . Gabapentin Other (See Comments)    Unknown reaction  . Hydralazine Itching  . Dexilant [Dexlansoprazole] Other (See Comments)    Upset stomach  . Levaquin [Levofloxacin In D5w] Rash  . Morphine And Related Itching and Other (See Comments)    Itching in feet  . Plavix [Clopidogrel Bisulfate] Rash  . Protonix [Pantoprazole Sodium] Rash  . Venofer [Ferric Oxide] Itching and Other (See Comments)    Patient reports using Benadryl prior to doses as [place]    Patient Measurements: Height: 5\' 1"  (154.9 cm) Weight: 135 lb 5.8 oz (61.4 kg) IBW/kg (Calculated) : 47.8 HEPARIN DW (KG): 59.4   Vital Signs: Temp: 98.6 F (37 C) (06/06 0720) Temp Source: Oral (06/06 0720) BP: 152/53 (06/06 0800) Pulse Rate: 69 (06/06 0800)  Labs: Recent Labs    12/14/18 0143  12/14/18 0459 12/14/18 0941 12/14/18 1124 12/14/18 1611 12/15/18 0403  HGB 11.0* 10.5*  --   --  9.4* 8.5*  HCT 34.6* 34.3*  --   --  30.0* 27.6*  PLT 153 133*  --   --  152 131*  APTT 34  --   --   --   --   --   LABPROT 13.2  --   --   --   --   --   INR 1.0  --   --   --   --   --   HEPARINUNFRC  --   --  0.64  --  0.44 0.37  CREATININE 7.62* 7.93*  --   --  9.20* 4.75*  TROPONINI 0.07* 0.11*  --  0.11* 0.09*  --    Estimated Creatinine Clearance: 8.2 mL/min (A) (by C-G formula based on SCr of 4.75 mg/dL (H)).  Medical History: Past Medical History:  Diagnosis Date  . Acute on chronic respiratory failure with hypoxia ([place]) 10/10/2016  . Anxiety   . Arthritis   . AVM (arteriovenous malformation) of colon   . CAD (coronary artery disease)    a. s/p CABG in 2013 b. most recent DES to D1 in 10/2016 with cath showing patent LIMA-LAD, SVG-RI, SVG-Mrg and SVG-RCA  c. NST in 02/2018 showing scar with no significant ischemia  . Carotid artery disease ([place])    a. 96-78% LICA, 03/3809   . Chronic bronchitis ([place])   .  Chronic diastolic CHF (congestive heart failure) ([place])    a. 02/2012 Echo EF 60-65%, nl wall motion, Gr 1 DD, mod MR  . Colon cancer ([place]) 1992  . Esophageal stricture   . ESRD on hemodialysis ([place])    ESRD due to HTN, started dialysis 2011 and gets HD at [facility] with Dr Hinda Lenis on MWF schedule.  Access is LUA AVF as of Sept 2014.   Marland Kitchen GERD (gastroesophageal reflux disease)   . High cholesterol 12/2011  . History of blood transfusion 07/2011; 12/2011; 01/2012 X 2; 04/2012  . History of gout   . History of lower GI bleeding   . Hypertension   . Iron deficiency anemia   . Mitral regurgitation    a. Moderate by echo, 02/2012  . Myocardial infarction ([place])   . Ovarian cancer ([place]) 1992  . Pneumonia ~ 2009  . PUD (peptic ulcer disease)   . TIA (transient ischemic attack)     Medications:  Medications Prior to Admission  Medication Sig Dispense Refill Last Dose  .  ALPRAZolam (XANAX) 0.5 MG tablet Take 0.5 mg by mouth daily.   12/13/2018 at Unknown time  . amiodarone (PACERONE) 200 MG tablet Take 1 tablet (200 mg total) by mouth daily. 30 tablet 0 12/13/2018 at Unknown time  . bisacodyl (DULCOLAX) 10 MG suppository Place 1 suppository (10 mg total) rectally as needed for moderate constipation. 12 suppository 0 12/13/2018 at Unknown time  . carvedilol (COREG) 6.25 MG tablet Take 6.25 mg by mouth 2 (two) times daily with a meal.   12/13/2018 at 09:45am  . cinacalcet (SENSIPAR) 30 MG tablet Take 30 mg by mouth daily after supper.   12/13/2018 at Unknown time  . diphenhydrAMINE (BENADRYL) 25 mg capsule Take 50 mg by mouth daily as needed for itching or allergies.    12/13/2018 at Unknown time  . fluticasone (FLONASE) 50 MCG/ACT nasal spray Place 1 spray at bedtime as needed into both nostrils for allergies.    12/13/2018 at Unknown time  . isosorbide mononitrate (IMDUR) 120 MG 24 hr tablet Take 1 tablet by mouth once daily (Patient taking differently: Take 120 mg by mouth daily. ) 90 tablet 2 12/13/2018 at Unknown time  . lidocaine-prilocaine (EMLA) cream Apply 1 application topically every Monday, Wednesday, and Friday. Prior to dialysis   Past Week at Unknown time  . loratadine (CLARITIN) 10 MG tablet Take 1 tablet (10 mg total) by mouth daily as needed for allergies. 30 tablet 0 12/13/2018 at Unknown time  . multivitamin (RENA-VIT) TABS tablet Take 1 tablet by mouth daily.   12/13/2018 at Unknown time  . nitroGLYCERIN (NITROSTAT) 0.4 MG SL tablet DISSOLVE ONE TABLET UNDER THE TONGUE EVERY 5 MINUTES AS NEEDED FOR CHEST PAIN.  DO NOT EXCEED A TOTAL OF 3 DOSES IN 15 MINUTES (Patient taking differently: Place 0.4 mg under the tongue every 5 (five) minutes as needed. ) 25 tablet 2 12/13/2018 at Unknown time  . omeprazole (PRILOSEC) 20 MG capsule Take 1 capsule (20 mg total) by mouth daily. 90 capsule 3 12/13/2018 at Unknown time  . sevelamer carbonate (RENVELA) 800 MG tablet Take 800-2,400 mg  by mouth See admin instructions. Take 3 tablets (2400) by mouth twice daily with meals, take 1 tablet (800 mg) with snacks   12/13/2018 at Unknown time  . simvastatin (ZOCOR) 20 MG tablet TAKE ONE TABLET (20 MG) BY MOUTH DAILY AT BEDTIME (Patient taking differently: Take 20 mg by mouth daily at 6 PM. TAKE ONE  TABLET (20 MG) BY MOUTH DAILY AT BEDTIME) 90 tablet 3 12/13/2018 at Unknown time  . ticagrelor (BRILINTA) 60 MG TABS tablet Take 1 tablet (60 mg total) by mouth 2 (two) times daily. 60 tablet 6 12/13/2018 at Unknown time    Assessment: 78 yo presented with chest pain.  Hx CAD, most recent cardiac cath in January 2020.  Pharmacy asked to start heparin gtt.    HL 0.37  Goal of Therapy:  Heparin level 0.3-0.7 units/ml  Monitor platelets per protocol   Plan:  Continue heparin infusion at 700 units/hr Check anti-Xa level daily while on heparin Continue to monitor H&H and platelets   Ramond Craver, [facility] 12/15/2018,8:12 AM

## 2018-12-16 LAB — HEPATITIS B SURFACE ANTIGEN: Hepatitis B Surface Ag: NEGATIVE

## 2018-12-17 DIAGNOSIS — D631 Anemia in chronic kidney disease: Secondary | ICD-10-CM | POA: Diagnosis not present

## 2018-12-17 DIAGNOSIS — N2581 Secondary hyperparathyroidism of renal origin: Secondary | ICD-10-CM | POA: Diagnosis not present

## 2018-12-19 ENCOUNTER — Telehealth: Payer: Self-pay | Admitting: Cardiovascular Disease

## 2018-12-19 NOTE — Telephone Encounter (Signed)
Pt is taking her dialysis treatment right now and her BP is dropping. Please call [phone]

## 2018-12-19 NOTE — Telephone Encounter (Signed)
Returned pt call. Informed her that Dr. Bronson Ing has deferred her blood pressure issues to Dr. Lowanda Foster. She voiced understanding.

## 2018-12-21 DIAGNOSIS — Z6825 Body mass index (BMI) 25.0-25.9, adult: Secondary | ICD-10-CM | POA: Diagnosis not present

## 2018-12-21 DIAGNOSIS — K921 Melena: Secondary | ICD-10-CM | POA: Diagnosis not present

## 2018-12-24 DIAGNOSIS — R5383 Other fatigue: Secondary | ICD-10-CM | POA: Diagnosis not present

## 2018-12-24 DIAGNOSIS — N186 End stage renal disease: Secondary | ICD-10-CM | POA: Diagnosis not present

## 2018-12-24 DIAGNOSIS — G4700 Insomnia, unspecified: Secondary | ICD-10-CM | POA: Diagnosis not present

## 2018-12-24 DIAGNOSIS — K921 Melena: Secondary | ICD-10-CM | POA: Diagnosis not present

## 2018-12-24 DIAGNOSIS — D638 Anemia in other chronic diseases classified elsewhere: Secondary | ICD-10-CM | POA: Diagnosis not present

## 2018-12-24 DIAGNOSIS — Z6825 Body mass index (BMI) 25.0-25.9, adult: Secondary | ICD-10-CM | POA: Diagnosis not present

## 2018-12-24 DIAGNOSIS — I10 Essential (primary) hypertension: Secondary | ICD-10-CM | POA: Diagnosis not present

## 2018-12-25 ENCOUNTER — Ambulatory Visit (HOSPITAL_COMMUNITY): Admission: RE | Admit: 2018-12-25 | Payer: Medicare HMO | Source: Ambulatory Visit

## 2018-12-25 ENCOUNTER — Inpatient Hospital Stay (HOSPITAL_COMMUNITY)
Admission: AD | Admit: 2018-12-25 | Discharge: 2018-12-27 | DRG: 640 | Disposition: A | Discharge status: Home / Self Care | Payer: Medicare HMO | Source: Other Acute Inpatient Hospital | Attending: Internal Medicine | Admitting: Internal Medicine

## 2018-12-25 ENCOUNTER — Inpatient Hospital Stay (HOSPITAL_COMMUNITY): Payer: Medicare HMO

## 2018-12-25 ENCOUNTER — Encounter (HOSPITAL_COMMUNITY): Payer: Self-pay | Admitting: Internal Medicine

## 2018-12-25 DIAGNOSIS — K5521 Angiodysplasia of colon with hemorrhage: Secondary | ICD-10-CM | POA: Diagnosis present

## 2018-12-25 DIAGNOSIS — Z833 Family history of diabetes mellitus: Secondary | ICD-10-CM

## 2018-12-25 DIAGNOSIS — K219 Gastro-esophageal reflux disease without esophagitis: Secondary | ICD-10-CM | POA: Diagnosis present

## 2018-12-25 DIAGNOSIS — E8770 Fluid overload, unspecified: Secondary | ICD-10-CM | POA: Diagnosis not present

## 2018-12-25 DIAGNOSIS — Z9049 Acquired absence of other specified parts of digestive tract: Secondary | ICD-10-CM

## 2018-12-25 DIAGNOSIS — E8889 Other specified metabolic disorders: Secondary | ICD-10-CM | POA: Diagnosis present

## 2018-12-25 DIAGNOSIS — Z1159 Encounter for screening for other viral diseases: Secondary | ICD-10-CM | POA: Diagnosis not present

## 2018-12-25 DIAGNOSIS — N2581 Secondary hyperparathyroidism of renal origin: Secondary | ICD-10-CM | POA: Diagnosis not present

## 2018-12-25 DIAGNOSIS — I10 Essential (primary) hypertension: Secondary | ICD-10-CM | POA: Diagnosis not present

## 2018-12-25 DIAGNOSIS — Z992 Dependence on renal dialysis: Secondary | ICD-10-CM

## 2018-12-25 DIAGNOSIS — R0602 Shortness of breath: Secondary | ICD-10-CM

## 2018-12-25 DIAGNOSIS — I480 Paroxysmal atrial fibrillation: Secondary | ICD-10-CM | POA: Diagnosis present

## 2018-12-25 DIAGNOSIS — I2510 Atherosclerotic heart disease of native coronary artery without angina pectoris: Secondary | ICD-10-CM | POA: Diagnosis present

## 2018-12-25 DIAGNOSIS — D631 Anemia in chronic kidney disease: Secondary | ICD-10-CM | POA: Diagnosis present

## 2018-12-25 DIAGNOSIS — N185 Chronic kidney disease, stage 5: Secondary | ICD-10-CM | POA: Diagnosis not present

## 2018-12-25 DIAGNOSIS — I161 Hypertensive emergency: Secondary | ICD-10-CM | POA: Diagnosis present

## 2018-12-25 DIAGNOSIS — M109 Gout, unspecified: Secondary | ICD-10-CM | POA: Diagnosis present

## 2018-12-25 DIAGNOSIS — I252 Old myocardial infarction: Secondary | ICD-10-CM

## 2018-12-25 DIAGNOSIS — Z8673 Personal history of transient ischemic attack (TIA), and cerebral infarction without residual deficits: Secondary | ICD-10-CM | POA: Diagnosis not present

## 2018-12-25 DIAGNOSIS — Z888 Allergy status to other drugs, medicaments and biological substances status: Secondary | ICD-10-CM

## 2018-12-25 DIAGNOSIS — I132 Hypertensive heart and chronic kidney disease with heart failure and with stage 5 chronic kidney disease, or end stage renal disease: Secondary | ICD-10-CM | POA: Diagnosis present

## 2018-12-25 DIAGNOSIS — I160 Hypertensive urgency: Secondary | ICD-10-CM | POA: Diagnosis present

## 2018-12-25 DIAGNOSIS — Z951 Presence of aortocoronary bypass graft: Secondary | ICD-10-CM

## 2018-12-25 DIAGNOSIS — E785 Hyperlipidemia, unspecified: Secondary | ICD-10-CM | POA: Diagnosis present

## 2018-12-25 DIAGNOSIS — I340 Nonrheumatic mitral (valve) insufficiency: Secondary | ICD-10-CM | POA: Diagnosis present

## 2018-12-25 DIAGNOSIS — J9601 Acute respiratory failure with hypoxia: Secondary | ICD-10-CM | POA: Diagnosis present

## 2018-12-25 DIAGNOSIS — Z886 Allergy status to analgesic agent status: Secondary | ICD-10-CM

## 2018-12-25 DIAGNOSIS — N186 End stage renal disease: Secondary | ICD-10-CM | POA: Diagnosis present

## 2018-12-25 DIAGNOSIS — Z8349 Family history of other endocrine, nutritional and metabolic diseases: Secondary | ICD-10-CM

## 2018-12-25 DIAGNOSIS — I4891 Unspecified atrial fibrillation: Secondary | ICD-10-CM | POA: Diagnosis present

## 2018-12-25 DIAGNOSIS — I509 Heart failure, unspecified: Secondary | ICD-10-CM | POA: Diagnosis not present

## 2018-12-25 DIAGNOSIS — N189 Chronic kidney disease, unspecified: Secondary | ICD-10-CM | POA: Diagnosis present

## 2018-12-25 DIAGNOSIS — I120 Hypertensive chronic kidney disease with stage 5 chronic kidney disease or end stage renal disease: Secondary | ICD-10-CM | POA: Diagnosis not present

## 2018-12-25 DIAGNOSIS — Z955 Presence of coronary angioplasty implant and graft: Secondary | ICD-10-CM

## 2018-12-25 DIAGNOSIS — I5032 Chronic diastolic (congestive) heart failure: Secondary | ICD-10-CM | POA: Diagnosis present

## 2018-12-25 DIAGNOSIS — Z85038 Personal history of other malignant neoplasm of large intestine: Secondary | ICD-10-CM

## 2018-12-25 DIAGNOSIS — Z8249 Family history of ischemic heart disease and other diseases of the circulatory system: Secondary | ICD-10-CM

## 2018-12-25 DIAGNOSIS — Z8543 Personal history of malignant neoplasm of ovary: Secondary | ICD-10-CM

## 2018-12-25 DIAGNOSIS — I5042 Chronic combined systolic (congestive) and diastolic (congestive) heart failure: Secondary | ICD-10-CM | POA: Diagnosis present

## 2018-12-25 DIAGNOSIS — R0603 Acute respiratory distress: Secondary | ICD-10-CM | POA: Diagnosis not present

## 2018-12-25 DIAGNOSIS — J810 Acute pulmonary edema: Secondary | ICD-10-CM | POA: Diagnosis present

## 2018-12-25 DIAGNOSIS — I5022 Chronic systolic (congestive) heart failure: Secondary | ICD-10-CM | POA: Diagnosis present

## 2018-12-25 DIAGNOSIS — Z883 Allergy status to other anti-infective agents status: Secondary | ICD-10-CM

## 2018-12-25 DIAGNOSIS — E8779 Other fluid overload: Secondary | ICD-10-CM | POA: Diagnosis present

## 2018-12-25 DIAGNOSIS — Z7951 Long term (current) use of inhaled steroids: Secondary | ICD-10-CM

## 2018-12-25 DIAGNOSIS — K5520 Angiodysplasia of colon without hemorrhage: Secondary | ICD-10-CM | POA: Diagnosis present

## 2018-12-25 DIAGNOSIS — Z885 Allergy status to narcotic agent status: Secondary | ICD-10-CM

## 2018-12-25 DIAGNOSIS — J9621 Acute and chronic respiratory failure with hypoxia: Secondary | ICD-10-CM | POA: Diagnosis present

## 2018-12-25 DIAGNOSIS — Z91041 Radiographic dye allergy status: Secondary | ICD-10-CM

## 2018-12-25 DIAGNOSIS — Z7902 Long term (current) use of antithrombotics/antiplatelets: Secondary | ICD-10-CM

## 2018-12-25 DIAGNOSIS — Z79899 Other long term (current) drug therapy: Secondary | ICD-10-CM

## 2018-12-25 DIAGNOSIS — Z90710 Acquired absence of both cervix and uterus: Secondary | ICD-10-CM

## 2018-12-25 DIAGNOSIS — Z880 Allergy status to penicillin: Secondary | ICD-10-CM | POA: Diagnosis not present

## 2018-12-25 LAB — HEPATIC FUNCTION PANEL
ALT: 18 U/L (ref 0–44)
AST: 15 U/L (ref 15–41)
Albumin: 3.2 g/dL — ABNORMAL LOW (ref 3.5–5.0)
Alkaline Phosphatase: 77 U/L (ref 38–126)
Bilirubin, Direct: 0.2 mg/dL (ref 0.0–0.2)
Indirect Bilirubin: 0.5 mg/dL (ref 0.3–0.9)
Total Bilirubin: 0.7 mg/dL (ref 0.3–1.2)
Total Protein: 5.9 g/dL — ABNORMAL LOW (ref 6.5–8.1)

## 2018-12-25 LAB — BASIC METABOLIC PANEL
Anion gap: 16 — ABNORMAL HIGH (ref 5–15)
BUN: 27 mg/dL — ABNORMAL HIGH (ref 8–23)
CO2: 23 mmol/L (ref 22–32)
Calcium: 11.9 mg/dL — ABNORMAL HIGH (ref 8.9–10.3)
Chloride: 100 mmol/L (ref 98–111)
Creatinine, Ser: 7.47 mg/dL — ABNORMAL HIGH (ref 0.44–1.00)
GFR calc Af Amer: 5 mL/min — ABNORMAL LOW (ref >60)
GFR calc non Af Amer: 5 mL/min — ABNORMAL LOW (ref >60)
Glucose, Bld: 126 mg/dL — ABNORMAL HIGH (ref 70–99)
Potassium: 4.1 mmol/L (ref 3.5–5.1)
Sodium: 139 mmol/L (ref 135–145)

## 2018-12-25 LAB — CBC WITH DIFFERENTIAL/PLATELET
Abs Immature Granulocytes: 0.06 10*3/uL (ref 0.00–0.07)
Basophils Absolute: 0 10*3/uL (ref 0.0–0.1)
Basophils Relative: 0 %
Eosinophils Absolute: 0.2 10*3/uL (ref 0.0–0.5)
Eosinophils Relative: 2 %
HCT: 29.5 % — ABNORMAL LOW (ref 36.0–46.0)
Hemoglobin: 9.3 g/dL — ABNORMAL LOW (ref 12.0–15.0)
Immature Granulocytes: 1 %
Lymphocytes Relative: 8 %
Lymphs Abs: 0.9 10*3/uL (ref 0.7–4.0)
MCH: 34.4 pg — ABNORMAL HIGH (ref 26.0–34.0)
MCHC: 31.5 g/dL (ref 30.0–36.0)
MCV: 109.3 fL — ABNORMAL HIGH (ref 80.0–100.0)
Monocytes Absolute: 0.8 10*3/uL (ref 0.1–1.0)
Monocytes Relative: 7 %
Neutro Abs: 9 10*3/uL — ABNORMAL HIGH (ref 1.7–7.7)
Neutrophils Relative %: 82 %
Platelets: 216 10*3/uL (ref 150–400)
RBC: 2.7 MIL/uL — ABNORMAL LOW (ref 3.87–5.11)
RDW: 17 % — ABNORMAL HIGH (ref 11.5–15.5)
WBC: 11 10*3/uL — ABNORMAL HIGH (ref 4.0–10.5)
nRBC: 0 % (ref 0.0–0.2)

## 2018-12-25 LAB — TROPONIN I: Troponin I: 0.37 ng/mL (ref <0.03)

## 2018-12-25 LAB — GLUCOSE, CAPILLARY: Glucose-Capillary: 72 mg/dL (ref 70–99)

## 2018-12-25 IMAGING — DX PORTABLE CHEST - 1 VIEW
1 series · 1 of 1 positions shown · non-contrast
Comparison: [DATE]

CLINICAL DATA: Shortness of breath

EXAM:
PORTABLE CHEST 1 VIEW

[chest ap]
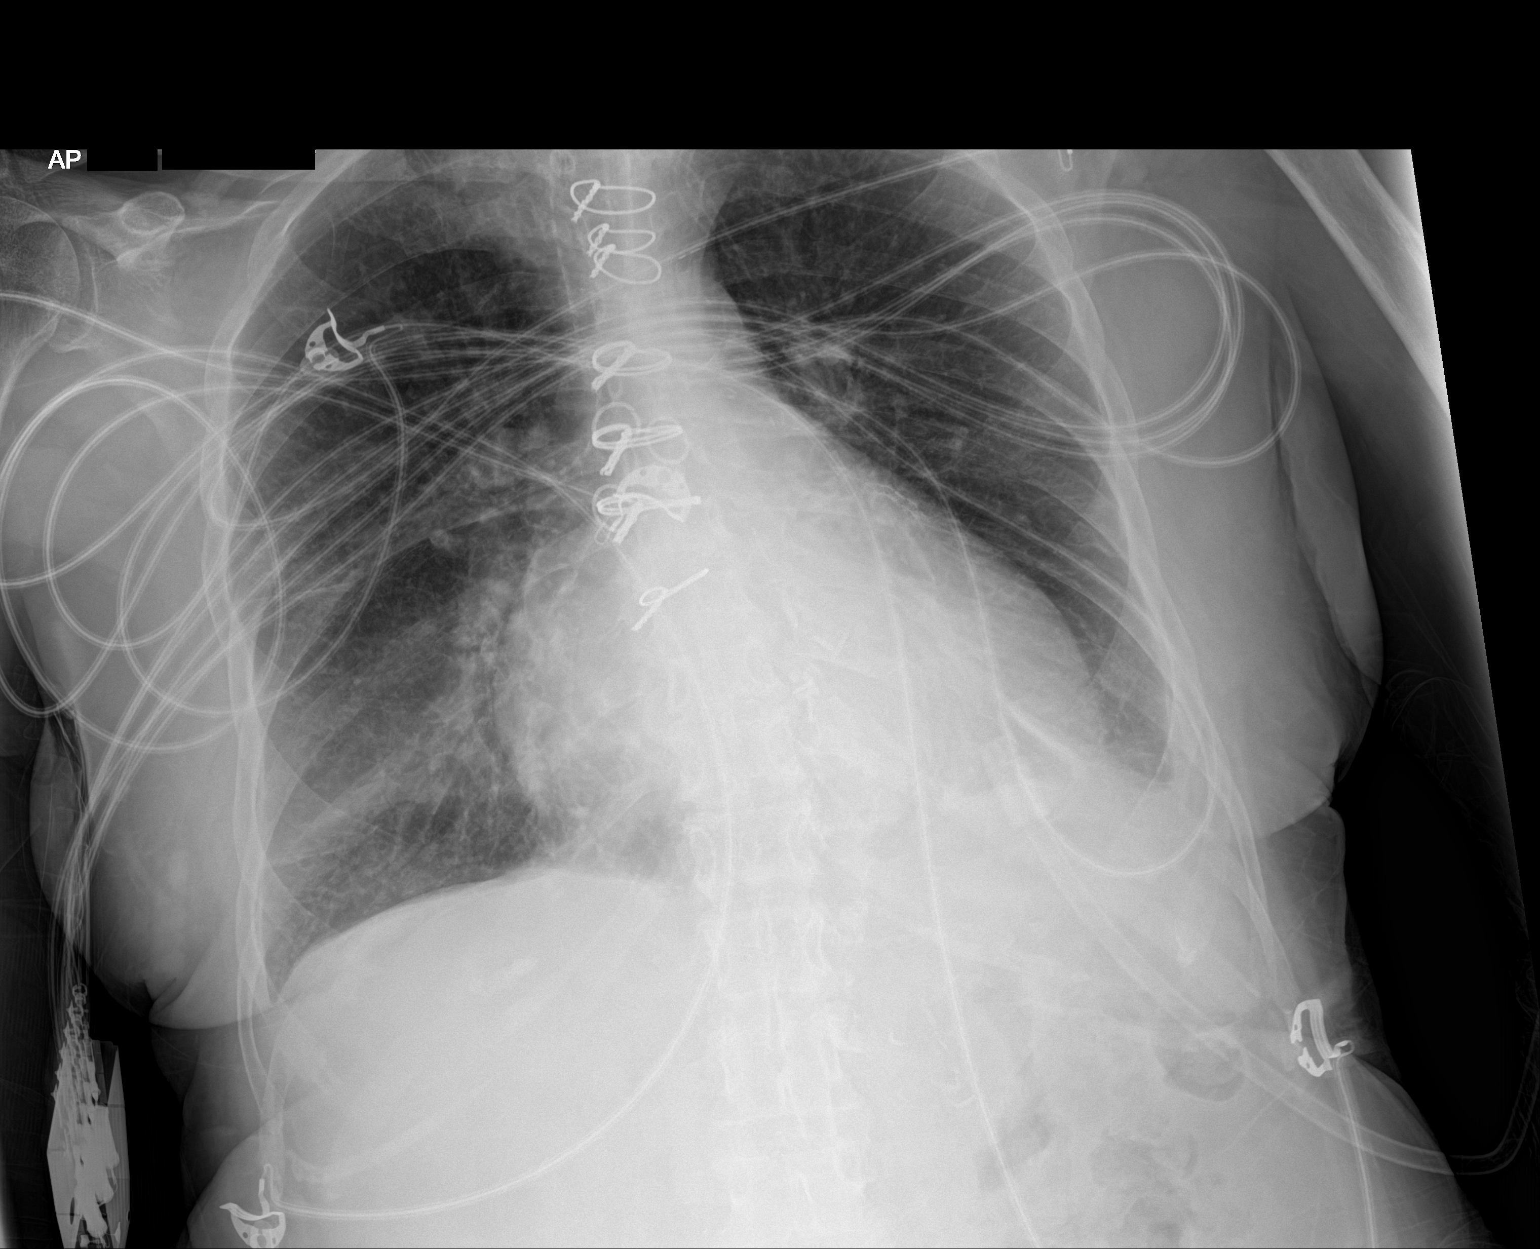

[1 of 1 positions shown; findings below may reference images not displayed]

FINDINGS: Heart size remains enlarged. The patient is status post prior median
sternotomy. There is persistent volume overload. There are small
bilateral pleural effusions, left greater than right. Bibasilar
airspace opacities are favored to be secondary to pulmonary edema or
atelectasis, less likely infiltrate.
IMPRESSION: Persistent but improving congestive heart failure.

## 2018-12-25 MED ORDER — DIPHENHYDRAMINE HCL 25 MG PO CAPS
50.0000 mg | ORAL_CAPSULE | Freq: Every day | ORAL | Status: DC | PRN
  Administered 2018-12-26 – 2018-12-27 (×2): 50 mg via ORAL
  Filled 2018-12-25 (×2): qty 2

## 2018-12-25 MED ORDER — SIMVASTATIN 20 MG PO TABS: 20.0000 mg | ORAL_TABLET | Freq: Every day | ORAL | Status: DC

## 2018-12-25 MED ORDER — PANTOPRAZOLE SODIUM 40 MG PO TBEC
40.0000 mg | DELAYED_RELEASE_TABLET | Freq: Every day | ORAL | Status: DC
  Administered 2018-12-26 – 2018-12-27 (×2): 40 mg via ORAL
  Filled 2018-12-25 (×2): qty 1

## 2018-12-25 MED ORDER — SEVELAMER CARBONATE 800 MG PO TABS
2400.0000 mg | ORAL_TABLET | Freq: Two times a day (BID) | ORAL | Status: DC
  Administered 2018-12-27: 2400 mg via ORAL
  Filled 2018-12-25 (×2): qty 3

## 2018-12-25 MED ORDER — AMIODARONE HCL 200 MG PO TABS
200.0000 mg | ORAL_TABLET | Freq: Every day | ORAL | Status: DC
  Administered 2018-12-26 – 2018-12-27 (×2): 200 mg via ORAL
  Filled 2018-12-25 (×2): qty 1

## 2018-12-25 MED ORDER — HEPARIN SODIUM (PORCINE) 5000 UNIT/ML IJ SOLN
5000.0000 [IU] | Freq: Three times a day (TID) | INTRAMUSCULAR | Status: DC
  Administered 2018-12-25 – 2018-12-27 (×4): 5000 [IU] via SUBCUTANEOUS
  Filled 2018-12-25 (×4): qty 1

## 2018-12-25 MED ORDER — SEVELAMER CARBONATE 800 MG PO TABS: 800.0000 mg | ORAL_TABLET | ORAL | Status: DC | PRN

## 2018-12-25 MED ORDER — RENA-VITE PO TABS
1.0000 | ORAL_TABLET | Freq: Every day | ORAL | Status: DC
  Administered 2018-12-25 – 2018-12-26 (×2): 1 via ORAL
  Filled 2018-12-25 (×2): qty 1

## 2018-12-25 MED ORDER — ALPRAZOLAM 0.5 MG PO TABS
0.5000 mg | ORAL_TABLET | Freq: Every day | ORAL | Status: DC
  Administered 2018-12-26 – 2018-12-27 (×2): 0.5 mg via ORAL
  Filled 2018-12-25 (×2): qty 1

## 2018-12-25 MED ORDER — HYDRALAZINE HCL 20 MG/ML IJ SOLN
10.0000 mg | Freq: Once | INTRAMUSCULAR | Status: AC
  Administered 2018-12-25: 10 mg via INTRAVENOUS
  Filled 2018-12-25: qty 1

## 2018-12-25 MED ORDER — ISOSORBIDE MONONITRATE ER 60 MG PO TB24
60.0000 mg | ORAL_TABLET | Freq: Two times a day (BID) | ORAL | Status: DC
  Administered 2018-12-25 – 2018-12-27 (×4): 60 mg via ORAL
  Filled 2018-12-25 (×4): qty 1

## 2018-12-25 MED ORDER — CARVEDILOL 6.25 MG PO TABS
6.2500 mg | ORAL_TABLET | Freq: Two times a day (BID) | ORAL | Status: DC
  Administered 2018-12-26 (×2): 6.25 mg via ORAL
  Filled 2018-12-25 (×2): qty 1

## 2018-12-25 MED ORDER — CARVEDILOL 6.25 MG PO TABS: 6.2500 mg | ORAL_TABLET | Freq: Two times a day (BID) | ORAL | Status: DC

## 2018-12-25 MED ORDER — NITROGLYCERIN 0.4 MG SL SUBL: 0.4000 mg | SUBLINGUAL_TABLET | SUBLINGUAL | Status: DC | PRN

## 2018-12-25 MED ORDER — TICAGRELOR 60 MG PO TABS
60.0000 mg | ORAL_TABLET | Freq: Two times a day (BID) | ORAL | Status: DC
  Administered 2018-12-25 – 2018-12-27 (×4): 60 mg via ORAL
  Filled 2018-12-25 (×5): qty 1

## 2018-12-25 NOTE — Progress Notes (Signed)
Patient admit to 2W 09, alert oriented times 4, no c/o pain at this time. VS within normal range except SBP elevated. Patient in bed resting comfortably, bed locked at lower level, call light, bedside table and telephone within patient reach. Will continue to monitor  Patient.

## 2018-12-25 NOTE — H&P (Addendum)
History and Physical    Shawna [name]:[id number] DOB: September 22, 1940 DOA: 12/25/2018  PCP: [name], MD  Patient coming from: Patient was transferred from [facility].  Chief Complaint: Shortness of breath.  HPI: Shawna [name] is a 78 y.o. female with history of ESRD on hemodialysis on Monday Wednesday Friday, CAD status post CABG and stent recent cardiac cath in January 2020, paroxysmal atrial fibrillation history of GI bleed secondary to AVMs recently admitted for chest pain managed medically and was attributed to uncontrolled blood pressure had her regular dialysis on Monday and is scheduled to have a blood transfusion started getting more short of breath and this morning.  Had gone to the ER at [facility] was found to be hypotensive and short of breath.  Was placed on BiPAP initially and with blood pressure improving.  Denies any chest pain productive cough fever chills nausea vomiting headache or any focal deficit.  Patient was transferred to [facility] for further management including need for dialysis.  By the time I examined the patient patient is on 2 L oxygen which also has been weaned off.  Patient is lying down on the bed without any distress.  On exam lungs appears clear.  Blood pressure is elevated for which we will give some as needed IV medication along with patient's night dose of Imdur and Coreg.  I have ordered a complete metabolic panel CBC EKG troponin chest x-ray all of which are pending.  ED Course: Patient was a direct admit.  Review of Systems: As per HPI, rest all negative.   Past Medical History:  Diagnosis Date  . Acute on chronic respiratory failure with hypoxia ([place]) 10/10/2016  . Anxiety   . Arthritis   . AVM (arteriovenous malformation) of colon   . CAD (coronary artery disease)    a. s/p CABG in 2013 b. most recent DES to D1 in 10/2016 with cath showing patent LIMA-LAD, SVG-RI, SVG-Mrg and SVG-RCA  c. NST in 02/2018 showing scar with no significant ischemia   . Carotid artery disease ([place])    a. 30-16% LICA, 0/1093   . Chronic bronchitis ([place])   . Chronic diastolic CHF (congestive heart failure) ([place])    a. 02/2012 Echo EF 60-65%, nl wall motion, Gr 1 DD, mod MR  . Colon cancer ([place]) 1992  . Esophageal stricture   . ESRD on hemodialysis ([place])    ESRD due to HTN, started dialysis 2011 and gets HD at [facility] with Dr Hinda Lenis on MWF schedule.  Access is LUA AVF as of Sept 2014.   Marland Kitchen GERD (gastroesophageal reflux disease)   . High cholesterol 12/2011  . History of blood transfusion 07/2011; 12/2011; 01/2012 X 2; 04/2012  . History of gout   . History of lower GI bleeding   . Hypertension   . Iron deficiency anemia   . Mitral regurgitation    a. Moderate by echo, 02/2012  . Myocardial infarction ([place])   . Ovarian cancer ([place]) 1992  . Pneumonia ~ 2009  . PUD (peptic ulcer disease)   . TIA (transient ischemic attack)     Past Surgical History:  Procedure Laterality Date  . ABDOMINAL HYSTERECTOMY  1992  . APPENDECTOMY  06/1990  . AV FISTULA PLACEMENT  07/2009   left upper arm  . AV FISTULA PLACEMENT Right 09/06/2016   Procedure: RIGHT FOREARM ARTERIOVENOUS (AV) GRAFT;  Surgeon: Elam Dutch, MD;  Location: [place];  Service: Vascular;  Laterality: Right;  . AV  FISTULA PLACEMENT N/A 02/24/2017   Procedure: INSERTION OF ARTERIOVENOUS (AV) GORE-TEX GRAFT ARM (BRACHIAL AXILLARY);  Surgeon: Katha Cabal, MD;  Location: [facility] ORS;  Service: Vascular;  Laterality: N/A;  . [place] Right 09/06/2016   Procedure: REMOVAL OF Right Arm ARTERIOVENOUS GORETEX GRAFT and Vein Patch angioplasty of brachial artery;  Surgeon: Angelia Mould, MD;  Location: [place];  Service: Vascular;  Laterality: Right;  . COLON RESECTION  1992  . COLON SURGERY    . CORONARY ANGIOPLASTY WITH STENT PLACEMENT  12/15/11   "2"  . CORONARY ANGIOPLASTY WITH STENT PLACEMENT  y/2013   "1; makes total of 3" (05/02/2012)  . CORONARY ARTERY BYPASS GRAFT  06/13/2012    Procedure: CORONARY ARTERY BYPASS GRAFTING (CABG);  Surgeon: Grace Isaac, MD;  Location: [place];  Service: Open Heart Surgery;  Laterality: N/A;  cabg x four;  using left internal mammary artery, and left leg greater saphenous vein harvested endoscopically  . CORONARY STENT INTERVENTION N/A 10/13/2016   Procedure: Coronary Stent Intervention;  Surgeon: Troy Sine, MD;  Location: [place] CV LAB;  Service: Cardiovascular;  Laterality: N/A;  . DIALYSIS/PERMA CATHETER REMOVAL N/A 04/18/2017   Procedure: DIALYSIS/PERMA CATHETER REMOVAL;  Surgeon: Katha Cabal, MD;  Location: [place] CV LAB;  Service: Cardiovascular;  Laterality: N/A;  . DILATION AND CURETTAGE OF UTERUS    . ESOPHAGOGASTRODUODENOSCOPY  01/20/2012   Procedure: ESOPHAGOGASTRODUODENOSCOPY (EGD);  Surgeon: Ladene Artist, MD,FACG;  Location: [facility] ENDOSCOPY;  Service: Endoscopy;  Laterality: N/A;  . ESOPHAGOGASTRODUODENOSCOPY N/A 03/26/2013   Procedure: ESOPHAGOGASTRODUODENOSCOPY (EGD);  Surgeon: Irene Shipper, MD;  Location: [facility] ENDOSCOPY;  Service: Endoscopy;  Laterality: N/A;  . ESOPHAGOGASTRODUODENOSCOPY N/A 04/30/2015   Procedure: ESOPHAGOGASTRODUODENOSCOPY (EGD);  Surgeon: Rogene Houston, MD;  [facility];  Service: Endoscopy;  Laterality: N/A;  1pm - moved to 10/20 @ 1:10  . ESOPHAGOGASTRODUODENOSCOPY N/A 07/29/2016   Procedure: ESOPHAGOGASTRODUODENOSCOPY (EGD);  Surgeon: Manus Gunning, MD;  Location: [place];  Service: Gastroenterology;  Laterality: N/A;  enteroscopy  . INTRAOPERATIVE TRANSESOPHAGEAL ECHOCARDIOGRAM  06/13/2012   Procedure: INTRAOPERATIVE TRANSESOPHAGEAL ECHOCARDIOGRAM;  Surgeon: Grace Isaac, MD;  Location: [place];  Service: Open Heart Surgery;  Laterality: N/A;  . IR GENERIC HISTORICAL  07/26/2016   IR FLUORO GUIDE CV LINE RIGHT 07/26/2016 Greggory Keen, MD [facility]  . IR GENERIC HISTORICAL  07/26/2016   IR US GUIDE VASC ACCESS RIGHT 07/26/2016 Greggory Keen, MD  [facility]  . IR GENERIC HISTORICAL  08/02/2016   IR US GUIDE VASC ACCESS RIGHT 08/02/2016 Greggory Keen, MD [facility]  . IR GENERIC HISTORICAL  08/02/2016   IR FLUORO GUIDE CV LINE RIGHT 08/02/2016 Greggory Keen, MD [facility]  . IR RADIOLOGY PERIPHERAL GUIDED IV START  03/28/2017  . IR US GUIDE VASC ACCESS RIGHT  03/28/2017  . LEFT HEART CATH AND CORONARY ANGIOGRAPHY N/A 09/20/2016   Procedure: Left Heart Cath and Coronary Angiography;  Surgeon: [name], MD;  Location: [place] CV LAB;  Service: Cardiovascular;  Laterality: N/A;  . LEFT HEART CATH AND CORS/GRAFTS ANGIOGRAPHY N/A 10/13/2016   Procedure: Left Heart Cath and Cors/Grafts Angiography;  Surgeon: Troy Sine, MD;  Location: [place] CV LAB;  Service: Cardiovascular;  Laterality: N/A;  . LEFT HEART CATH AND CORS/GRAFTS ANGIOGRAPHY N/A 07/13/2018   Procedure: LEFT HEART CATH AND CORS/GRAFTS ANGIOGRAPHY;  Surgeon: Martinique, Peter M, MD;  Location: [place] CV LAB;  Service: Cardiovascular;  Laterality: N/A;  .  LEFT HEART CATHETERIZATION WITH CORONARY ANGIOGRAM N/A 12/15/2011   Procedure: LEFT HEART CATHETERIZATION WITH CORONARY ANGIOGRAM;  Surgeon: Burnell Blanks, MD;  Location: [facility] CATH LAB;  Service: Cardiovascular;  Laterality: N/A;  . LEFT HEART CATHETERIZATION WITH CORONARY ANGIOGRAM N/A 01/10/2012   Procedure: LEFT HEART CATHETERIZATION WITH CORONARY ANGIOGRAM;  Surgeon: Peter M Martinique, MD;  Location: [facility] CATH LAB;  Service: Cardiovascular;  Laterality: N/A;  . LEFT HEART CATHETERIZATION WITH CORONARY ANGIOGRAM N/A 06/08/2012   Procedure: LEFT HEART CATHETERIZATION WITH CORONARY ANGIOGRAM;  Surgeon: Burnell Blanks, MD;  Location: [facility] CATH LAB;  Service: Cardiovascular;  Laterality: N/A;  . LEFT HEART CATHETERIZATION WITH CORONARY/GRAFT ANGIOGRAM N/A 12/10/2013   Procedure: LEFT HEART CATHETERIZATION WITH Beatrix Fetters;  Surgeon: [name], MD;  Location: [facility] CATH LAB;  Service:  Cardiovascular;  Laterality: N/A;  . OVARY SURGERY     ovarian cancer  . REVISION OF ARTERIOVENOUS GORETEX GRAFT N/A 02/24/2017   Procedure: REVISION OF ARTERIOVENOUS GORETEX GRAFT (RESECTION);  Surgeon: Katha Cabal, MD;  Location: [facility] ORS;  Service: Vascular;  Laterality: N/A;  Earney Mallet N/A 10/15/2013   Procedure: Fistulogram;  Surgeon: Serafina Mitchell, MD;  Location: [facility] CATH LAB;  Service: Cardiovascular;  Laterality: N/A;  . THROMBECTOMY / ARTERIOVENOUS GRAFT REVISION  2011   left upper arm  . TUBAL LIGATION  1980's  . UPPER EXTREMITY ANGIOGRAPHY Bilateral 12/06/2016   Procedure: Upper Extremity Angiography;  Surgeon: Katha Cabal, MD;  Location: [place] CV LAB;  Service: Cardiovascular;  Laterality: Bilateral;  . UPPER EXTREMITY INTERVENTION Left 06/06/2017   Procedure: UPPER EXTREMITY INTERVENTION;  Surgeon: Katha Cabal, MD;  Location: [place] CV LAB;  Service: Cardiovascular;  Laterality: Left;     reports that she has never smoked. She has never used smokeless tobacco. She reports that she does not drink alcohol or use drugs.  Allergies  Allergen Reactions  . Aspirin Other (See Comments)    High Doses Mess up her stomach; "makes my bowels have blood in them". Takes 81 mg EC Aspirin   . Penicillins Other (See Comments)    SYNCOPE? , "makes me real weak when I take it; like I'll pass out"  Has patient had a PCN reaction causing immediate rash, facial/tongue/throat swelling, SOB or lightheadedness with hypotension: Yes Has patient had a PCN reaction causing severe rash involving mucus membranes or skin necrosis: no Has patient had a PCN reaction that required hospitalization no Has patient had a PCN reaction occurring within the last 10 years: no If all of the above  . Amlodipine Swelling  . Bactrim [Sulfamethoxazole-Trimethoprim] Rash  . Contrast Media [Iodinated Diagnostic Agents] Itching  . Iron Itching and Other (See Comments)    "they gave  me iron in dialysis; had to give me Benadryl cause I had to have the iron" (05/02/2012)  . Nitrofurantoin Hives  . Tylenol [Acetaminophen] Itching and Other (See Comments)    Makes her feet on fire per pt  . Gabapentin Other (See Comments)    Unknown reaction  . Hydralazine Itching  . Dexilant [Dexlansoprazole] Other (See Comments)    Upset stomach  . Levaquin [Levofloxacin In D5w] Rash  . Morphine And Related Itching and Other (See Comments)    Itching in feet  . Plavix [Clopidogrel Bisulfate] Rash  . Protonix [Pantoprazole Sodium] Rash  . Venofer [Ferric Oxide] Itching and Other (See Comments)    Patient reports using Benadryl prior to doses as [facility]  Family History  Problem Relation Age of Onset  . Heart disease Mother        Heart Disease before age 20  . Hyperlipidemia Mother   . Hypertension Mother   . Diabetes Mother   . Heart attack Mother   . Heart disease Father        Heart Disease before age 76  . Hyperlipidemia Father   . Hypertension Father   . Diabetes Father   . Diabetes Sister   . Hypertension Sister   . Diabetes Brother   . Hyperlipidemia Brother   . Heart attack Brother   . Hypertension Sister   . Heart attack Brother   . Other Other        noncontributory for early CAD  . Colon cancer Neg Hx   . Esophageal cancer Neg Hx   . Liver disease Neg Hx   . Kidney disease Neg Hx   . Colon polyps Neg Hx     Prior to Admission medications   Medication Sig Start Date End Date Taking? Authorizing Provider  ticagrelor (BRILINTA) 60 MG TABS tablet Take 1 tablet (60 mg total) by mouth 2 (two) times daily. 08/17/18  Yes Almyra Deforest, PA  ALPRAZolam Duanne Moron) 0.5 MG tablet Take 0.5 mg by mouth daily. 10/16/18   [provider]  amiodarone (PACERONE) 200 MG tablet Take 1 tablet (200 mg total) by mouth daily. 07/14/18   Hosie Poisson, MD  carvedilol (COREG) 6.25 MG tablet Take 6.25 mg by mouth 2 (two) times daily with a meal.    [provider]   diphenhydrAMINE (BENADRYL) 25 mg capsule Take 50 mg by mouth daily as needed for itching or allergies.     [provider]  fluticasone (FLONASE) 50 MCG/ACT nasal spray Place 1 spray at bedtime as needed into both nostrils for allergies.     [provider]  isosorbide mononitrate (IMDUR) 60 MG 24 hr tablet Take 1 tablet (60 mg total) by mouth 2 (two) times a day for 30 days. 12/15/18 01/14/19  Heath Lark D, DO  lidocaine-prilocaine (EMLA) cream Apply 1 application topically every Monday, Wednesday, and Friday. Prior to dialysis    [provider]  loratadine (CLARITIN) 10 MG tablet Take 1 tablet (10 mg total) by mouth daily as needed for allergies. 07/14/18   Hosie Poisson, MD  multivitamin (RENA-VIT) TABS tablet Take 1 tablet by mouth daily.    [provider]  nitroGLYCERIN (NITROSTAT) 0.4 MG SL tablet DISSOLVE ONE TABLET UNDER THE TONGUE EVERY 5 MINUTES AS NEEDED FOR CHEST PAIN.  DO NOT EXCEED A TOTAL OF 3 DOSES IN 15 MINUTES Patient taking differently: Place 0.4 mg under the tongue every 5 (five) minutes as needed.  10/08/18   Herminio Commons, MD  omeprazole (PRILOSEC) 20 MG capsule Take 1 capsule (20 mg total) by mouth daily. 11/21/17   Rogene Houston, MD  sevelamer carbonate (RENVELA) 800 MG tablet Take 800-2,400 mg by mouth See admin instructions. Take 3 tablets (2400) by mouth twice daily with meals, take 1 tablet (800 mg) with snacks    [provider]  simvastatin (ZOCOR) 20 MG tablet TAKE ONE TABLET (20 MG) BY MOUTH DAILY AT BEDTIME Patient taking differently: Take 20 mg by mouth daily at 6 PM. TAKE ONE TABLET (20 MG) BY MOUTH DAILY AT BEDTIME 04/03/18   Herminio Commons, MD    Physical Exam: Vitals:   12/25/18 1821  BP: (!) 169/67  Pulse: 71  Resp: Marland Kitchen)  24  Temp: 98.7 F (37.1 C)  TempSrc: Oral  SpO2: 100%  Weight: 61 kg  Height: 5\' 4"  (1.626 m)      Constitutional: Moderately built and nourished. Vitals:   12/25/18 1821   BP: (!) 169/67  Pulse: 71  Resp: (!) 24  Temp: 98.7 F (37.1 C)  TempSrc: Oral  SpO2: 100%  Weight: 61 kg  Height: 5\' 4"  (1.626 m)   Eyes: Anicteric no pallor. ENMT: No discharge from the ears eyes nose and mouth. Neck: No mass felt.  JVD elevated. Respiratory: No rhonchi or crepitations. Cardiovascular: S1-S2 heard. Abdomen: Soft nontender bowel sounds present. Musculoskeletal: No edema. Skin: No rash. Neurologic: Alert awake oriented to time place and person.  Moves all extremities. Psychiatric: Appears normal per normal affect.   Labs on Admission: I have personally reviewed following labs and imaging studies  CBC: No results for input(s): WBC, NEUTROABS, HGB, HCT, MCV, PLT in the last 168 hours. Basic Metabolic Panel: No results for input(s): NA, K, CL, CO2, GLUCOSE, BUN, CREATININE, CALCIUM, MG, PHOS in the last 168 hours. GFR: Estimated Creatinine Clearance: 8.4 mL/min (A) (by C-G formula based on SCr of 4.75 mg/dL (H)). Liver Function Tests: No results for input(s): AST, ALT, ALKPHOS, BILITOT, PROT, ALBUMIN in the last 168 hours. No results for input(s): LIPASE, AMYLASE in the last 168 hours. No results for input(s): AMMONIA in the last 168 hours. Coagulation Profile: No results for input(s): INR, PROTIME in the last 168 hours. Cardiac Enzymes: No results for input(s): CKTOTAL, CKMB, CKMBINDEX, TROPONINI in the last 168 hours. BNP (last 3 results) No results for input(s): PROBNP in the last 8760 hours. HbA1C: No results for input(s): HGBA1C in the last 72 hours. CBG: No results for input(s): GLUCAP in the last 168 hours. Lipid Profile: No results for input(s): CHOL, HDL, LDLCALC, TRIG, CHOLHDL, LDLDIRECT in the last 72 hours. Thyroid Function Tests: No results for input(s): TSH, T4TOTAL, FREET4, T3FREE, THYROIDAB in the last 72 hours. Anemia Panel: No results for input(s): VITAMINB12, FOLATE, FERRITIN, TIBC, IRON, RETICCTPCT in the last 72 hours. Urine  analysis:    Component Value Date/Time   COLORURINE YELLOW 12/16/2012 1919   APPEARANCEUR CLOUDY (A) 12/16/2012 1919   LABSPEC 1.009 12/16/2012 1919   PHURINE 7.5 12/16/2012 1919   GLUCOSEU NEGATIVE 12/16/2012 1919   HGBUR TRACE (A) 12/16/2012 1919   BILIRUBINUR NEGATIVE 12/16/2012 1919   KETONESUR NEGATIVE 12/16/2012 1919   PROTEINUR 100 (A) 12/16/2012 1919   UROBILINOGEN 0.2 12/16/2012 1919   NITRITE NEGATIVE 12/16/2012 1919   LEUKOCYTESUR SMALL (A) 12/16/2012 1919   Sepsis Labs: @LABRCNTIP (procalcitonin:4,lacticidven:4) )No results found for this or any previous visit (from the past 240 hour(s)).   Radiological Exams on Admission: No results found.  EKG: Independently reviewed.  Normal sinus rhythm LVH prolonged QT.  Assessment/Plan Principal Problem:   Acute on chronic respiratory failure with hypoxia (HCC) Active Problems:   ESRD on hemodialysis (HCC)   AVM (arteriovenous malformation) of small bowel, acquired   Anemia of chronic kidney failure   Hx of CABG   Chronic diastolic CHF (congestive heart failure) (HCC)   Essential hypertension   PAF (paroxysmal atrial fibrillation) (HCC)   Acute respiratory failure with hypoxia ([place])    1. Acute respiratory failure with hypoxia likely from acute pulmonary edema secondary to fluid overload in ESRD patient -we will consult nephrology for dialysis.  Patient is not in distress.  However given history of CAD we will cycle cardiac markers.  Chest x-ray  and COVID 19 test is pending. Not febrile and no symptoms suggestive of infection. 2. History of CAD status post CABG and stent placement last cardiac cath in January 2020 -we will cycle cardiac markers continue Brilinta Imdur Coreg and statins. 3. Hypertensive urgency likely contributing to patient's symptoms.  We will continue home medications including Coreg Imdur and also place patient on PRN IV labetalol.  Closely follow blood pressure trends. 4. ESRD on hemodialysis on Monday  Wednesday and Friday.  See #1. 5. Paroxysmal atrial fibrillation on Coreg.  Not on anticoagulation likely from chronic GI bleed.  On amiodarone. 6. Anemia likely from renal disease.  Follow CBC. 7. History of GI bleed secondary to AVMs.   DVT prophylaxis: Heparin. Code Status: Full code. Family Communication: Discussed with patient. Disposition Plan: Home. Consults called: We will consult nephrology. Admission status: Inpatient.   Rise Patience MD [facility] Pager [phone].  If 7PM-7AM, please contact night-coverage [email or web address] Password [email or web address]  12/25/2018, 8:16 PM

## 2018-12-26 ENCOUNTER — Other Ambulatory Visit: Payer: Self-pay

## 2018-12-26 DIAGNOSIS — K5520 Angiodysplasia of colon without hemorrhage: Secondary | ICD-10-CM

## 2018-12-26 DIAGNOSIS — N185 Chronic kidney disease, stage 5: Secondary | ICD-10-CM

## 2018-12-26 DIAGNOSIS — D631 Anemia in chronic kidney disease: Secondary | ICD-10-CM

## 2018-12-26 LAB — CBC
HCT: 24.8 % — ABNORMAL LOW (ref 36.0–46.0)
HCT: 27.3 % — ABNORMAL LOW (ref 36.0–46.0)
Hemoglobin: 7.7 g/dL — ABNORMAL LOW (ref 12.0–15.0)
Hemoglobin: 8.9 g/dL — ABNORMAL LOW (ref 12.0–15.0)
MCH: 34.1 pg — ABNORMAL HIGH (ref 26.0–34.0)
MCH: 35.3 pg — ABNORMAL HIGH (ref 26.0–34.0)
MCHC: 31 g/dL (ref 30.0–36.0)
MCHC: 32.6 g/dL (ref 30.0–36.0)
MCV: 109.7 fL — ABNORMAL HIGH (ref 80.0–100.0)
MCV: 108.3 fL — ABNORMAL HIGH (ref 80.0–100.0)
Platelets: 185 10*3/uL (ref 150–400)
Platelets: 213 10*3/uL (ref 150–400)
RBC: 2.26 MIL/uL — ABNORMAL LOW (ref 3.87–5.11)
RBC: 2.52 MIL/uL — ABNORMAL LOW (ref 3.87–5.11)
RDW: 17.1 % — ABNORMAL HIGH (ref 11.5–15.5)
RDW: 17.2 % — ABNORMAL HIGH (ref 11.5–15.5)
WBC: 6.7 10*3/uL (ref 4.0–10.5)
WBC: 7.8 10*3/uL (ref 4.0–10.5)
nRBC: 0 % (ref 0.0–0.2)
nRBC: 0 % (ref 0.0–0.2)

## 2018-12-26 LAB — RENAL FUNCTION PANEL
Albumin: 2.8 g/dL — ABNORMAL LOW (ref 3.5–5.0)
Albumin: 3.5 g/dL (ref 3.5–5.0)
Anion gap: 13 (ref 5–15)
Anion gap: 14 (ref 5–15)
BUN: 34 mg/dL — ABNORMAL HIGH (ref 8–23)
BUN: 9 mg/dL (ref 8–23)
CO2: 25 mmol/L (ref 22–32)
CO2: 28 mmol/L (ref 22–32)
Calcium: 11.3 mg/dL — ABNORMAL HIGH (ref 8.9–10.3)
Calcium: 11.1 mg/dL — ABNORMAL HIGH (ref 8.9–10.3)
Chloride: 98 mmol/L (ref 98–111)
Chloride: 97 mmol/L — ABNORMAL LOW (ref 98–111)
Creatinine, Ser: 8.99 mg/dL — ABNORMAL HIGH (ref 0.44–1.00)
Creatinine, Ser: 3.9 mg/dL — ABNORMAL HIGH (ref 0.44–1.00)
GFR calc Af Amer: 4 mL/min — ABNORMAL LOW (ref >60)
GFR calc Af Amer: 12 mL/min — ABNORMAL LOW (ref >60)
GFR calc non Af Amer: 4 mL/min — ABNORMAL LOW (ref >60)
GFR calc non Af Amer: 10 mL/min — ABNORMAL LOW (ref >60)
Glucose, Bld: 95 mg/dL (ref 70–99)
Glucose, Bld: 87 mg/dL (ref 70–99)
Phosphorus: 6.7 mg/dL — ABNORMAL HIGH (ref 2.5–4.6)
Phosphorus: 3.5 mg/dL (ref 2.5–4.6)
Potassium: 4 mmol/L (ref 3.5–5.1)
Potassium: 3.3 mmol/L — ABNORMAL LOW (ref 3.5–5.1)
Sodium: 136 mmol/L (ref 135–145)
Sodium: 139 mmol/L (ref 135–145)

## 2018-12-26 LAB — TROPONIN I: Troponin I: 0.37 ng/mL (ref <0.03)

## 2018-12-26 MED ORDER — LIDOCAINE-PRILOCAINE 2.5-2.5 % EX CREA
1.0000 "application " | TOPICAL_CREAM | CUTANEOUS | Status: DC | PRN
  Filled 2018-12-26: qty 5

## 2018-12-26 MED ORDER — SODIUM CHLORIDE 0.9 % IV SOLN: 100.0000 mL | INTRAVENOUS | Status: DC | PRN

## 2018-12-26 MED ORDER — LIDOCAINE HCL (PF) 1 % IJ SOLN: 5.0000 mL | INTRAMUSCULAR | Status: DC | PRN

## 2018-12-26 MED ORDER — PENTAFLUOROPROP-TETRAFLUOROETH EX AERO: 1.0000 "application " | INHALATION_SPRAY | CUTANEOUS | Status: DC | PRN

## 2018-12-26 MED ORDER — ALTEPLASE 2 MG IJ SOLR: 2.0000 mg | Freq: Once | INTRAMUSCULAR | Status: DC | PRN

## 2018-12-26 MED ORDER — LABETALOL HCL 5 MG/ML IV SOLN
10.0000 mg | INTRAVENOUS | Status: DC | PRN
  Administered 2018-12-26 – 2018-12-27 (×5): 10 mg via INTRAVENOUS
  Filled 2018-12-26 (×6): qty 4

## 2018-12-26 MED ORDER — HEPARIN SODIUM (PORCINE) 1000 UNIT/ML DIALYSIS
1000.0000 [IU] | INTRAMUSCULAR | Status: DC | PRN
  Filled 2018-12-26: qty 1

## 2018-12-26 MED ORDER — CHLORHEXIDINE GLUCONATE CLOTH 2 % EX PADS
6.0000 | MEDICATED_PAD | Freq: Every day | CUTANEOUS | Status: DC
  Administered 2018-12-26 – 2018-12-27 (×2): 6 via TOPICAL

## 2018-12-26 NOTE — Procedures (Signed)
Patient seen and examined on Hemodialysis. BP (!) 178/73   Pulse 74   Temp 97.9 F (36.6 C) (Oral)   Resp (!) 29   Ht 5\' 4"  (1.626 m)   Wt 62 kg   SpO2 100%   BMI 23.46 kg/m   QB 400 mL/ min via LUE AVF UF goal 3L  BP still high- expect to improve with full UF goal.  Will reassess to see if pt needs an extra rx.  Hgb 9.3--> 7.7 on pre-HD labs.  Will repeat and if true value will transfuse 1 u pRBCs.  Tolerating treatment without complaints at this time.   [name] MD [facility] pgr [phone] 4:40 PM

## 2018-12-26 NOTE — Progress Notes (Signed)
PROGRESS NOTE    Shawna [name]  EVO:[id number] DOB: 1941-04-04 DOA: 12/25/2018 PCP: [name], MD    Brief Narrative:  78 y.o. female with history of ESRD on hemodialysis on Monday Wednesday Friday, CAD status post CABG and stent recent cardiac cath in January 2020, paroxysmal atrial fibrillation history of GI bleed secondary to AVMs recently admitted for chest pain managed medically and was attributed to uncontrolled blood pressure had her regular dialysis on Monday and is scheduled to have a blood transfusion started getting more short of breath and this morning.  Had gone to the ER at [facility] was found to be hypotensive and short of breath.  Was placed on BiPAP initially and with blood pressure improving.  Denies any chest pain productive cough fever chills nausea vomiting headache or any focal deficit.  Patient was transferred to [facility] for further management including need for dialysis.  By the time I examined the patient patient is on 2 L oxygen which also has been weaned off.  Patient is lying down on the bed without any distress.  On exam lungs appears clear.  Blood pressure is elevated for which we will give some as needed IV medication along with patient's night dose of Imdur and Coreg.  I have ordered a complete metabolic panel CBC EKG troponin chest x-ray all of which are pending.   Assessment & Plan:   Principal Problem:   Acute on chronic respiratory failure with hypoxia (HCC) Active Problems:   ESRD on hemodialysis ([place])   AVM (arteriovenous malformation) of small bowel, acquired   Anemia of chronic kidney failure   Hx of CABG   Chronic diastolic CHF (congestive heart failure) (HCC)   Essential hypertension   PAF (paroxysmal atrial fibrillation) (HCC)   Acute respiratory failure with hypoxia ([place])  1. Acute respiratory failure with hypoxia likely from acute pulmonary edema secondary to fluid overload in ESRD patient 1. Chest imaging personally reviewed,  findings consistent with volume overload 2. COVID testing remains pending. Pt is afebrile 3. Nephrology consulted, plan for HD today 2. History of CAD status post CABG and stent placement last cardiac cath in January 2020 1. Troponin stable at 0.37. Pt denies chest pain 3. Hypertensive urgency likely contributing to patient's symptoms.   1. Will cont with home medications including Coreg Imdur 2. Pt continued on PRN IV labetalol 4. ESRD on hemodialysis on Monday Wednesday and Friday 1. Nephrology consulted per above 2. Plan for HD today 5. Paroxysmal atrial fibrillation on Coreg.   1. Not on anticoagulation likely from chronic GI bleed.   2. Patient stable on amiodarone. 6. Anemia likely from renal disease. 1. Repeat cbc in AM 7. History of GI bleed secondary to AVMs. 1. Stable at this time  DVT prophylaxis: heparin subq Code Status: Full Family Communication: Pt in room, family not at bedside Disposition Plan: Uncertain at this time  Consultants:   Nephrology  Procedures:     Antimicrobials: Anti-infectives (From admission, onward)   None       Subjective: Complains of feeling tired this AM  Objective: Vitals:   12/26/18 0500 12/26/18 0600 12/26/18 0756 12/26/18 1210  BP: (!) 155/59 (!) 164/48 (!) 186/64 (!) 172/59  Pulse:   81 75  Resp: 20 (!) 29 17 (!) 23  Temp:   98.8 F (37.1 C) 98.8 F (37.1 C)  TempSrc:   Oral Oral  SpO2: 97% 96% 97% 100%  Weight:      Height:  No intake or output data in the 24 hours ending 12/26/18 1338 Filed Weights   12/25/18 1821 12/25/18 2016  Weight: 61 kg 60.3 kg    Examination:  General exam: Appears calm and comfortable  Respiratory system: Clear to auscultation. Respiratory effort normal. Cardiovascular system: S1 & S2 heard, RRR Gastrointestinal system: Abdomen is nondistended, soft and nontender. No organomegaly or masses felt. Normal bowel sounds heard. Central nervous system: Alert and oriented. No focal  neurological deficits. Extremities: Symmetric 5 x 5 power. Skin: No rashes, lesions  Psychiatry: Judgement and insight appear normal. Mood & affect appropriate.   Data Reviewed: I have personally reviewed following labs and imaging studies  CBC: Recent Labs  Lab 12/25/18 2216  WBC 11.0*  NEUTROABS 9.0*  HGB 9.3*  HCT 29.5*  MCV 109.3*  PLT 811   Basic Metabolic Panel: Recent Labs  Lab 12/25/18 2216  NA 139  K 4.1  CL 100  CO2 23  GLUCOSE 126*  BUN 27*  CREATININE 7.47*  CALCIUM 11.9*   GFR: Estimated Creatinine Clearance: 5.4 mL/min (A) (by C-G formula based on SCr of 7.47 mg/dL (H)). Liver Function Tests: Recent Labs  Lab 12/25/18 2216  AST 15  ALT 18  ALKPHOS 77  BILITOT 0.7  PROT 5.9*  ALBUMIN 3.2*   No results for input(s): LIPASE, AMYLASE in the last 168 hours. No results for input(s): AMMONIA in the last 168 hours. Coagulation Profile: No results for input(s): INR, PROTIME in the last 168 hours. Cardiac Enzymes: Recent Labs  Lab 12/25/18 2216 12/26/18 0239  TROPONINI 0.37* 0.37*   BNP (last 3 results) No results for input(s): PROBNP in the last 8760 hours. HbA1C: No results for input(s): HGBA1C in the last 72 hours. CBG: Recent Labs  Lab 12/25/18 2223  GLUCAP 72   Lipid Profile: No results for input(s): CHOL, HDL, LDLCALC, TRIG, CHOLHDL, LDLDIRECT in the last 72 hours. Thyroid Function Tests: No results for input(s): TSH, T4TOTAL, FREET4, T3FREE, THYROIDAB in the last 72 hours. Anemia Panel: No results for input(s): VITAMINB12, FOLATE, FERRITIN, TIBC, IRON, RETICCTPCT in the last 72 hours. Sepsis Labs: No results for input(s): PROCALCITON, LATICACIDVEN in the last 168 hours.  No results found for this or any previous visit (from the past 240 hour(s)).   Radiology Studies: Dg Chest Port 1 View  Result Date: 12/25/2018 CLINICAL DATA:  Shortness of breath EXAM: PORTABLE CHEST 1 VIEW COMPARISON:  12/25/2018 FINDINGS: Heart size remains  enlarged. The patient is status post prior median sternotomy. There is persistent volume overload. There are small bilateral pleural effusions, left greater than right. Bibasilar airspace opacities are favored to be secondary to pulmonary edema or atelectasis, less likely infiltrate. IMPRESSION: Persistent but improving congestive heart failure. Electronically Signed   By: Constance Holster M.D.   On: 12/25/2018 20:25    Scheduled Meds: . ALPRAZolam  0.5 mg Oral Daily  . amiodarone  200 mg Oral Daily  . carvedilol  6.25 mg Oral BID WC  . Chlorhexidine Gluconate Cloth  6 each Topical Q0600  . heparin  5,000 Units Subcutaneous Q8H  . isosorbide mononitrate  60 mg Oral BID  . multivitamin  1 tablet Oral QHS  . pantoprazole  40 mg Oral Daily  . sevelamer carbonate  2,400 mg Oral BID WC  . simvastatin  20 mg Oral q1800  . ticagrelor  60 mg Oral BID   Continuous Infusions: . sodium chloride    . sodium chloride  LOS: 1 day   Marylu Lund, MD [facility] Pager On Amion  If 7PM-7AM, please contact night-coverage 12/26/2018, 1:38 PM

## 2018-12-26 NOTE — Progress Notes (Signed)
BP 191/66 with map 107 10 mg Labetalol given as per orders

## 2018-12-26 NOTE — Progress Notes (Signed)
Pt returned to room from Dialysis via bed.  BP 200/62 with a map of 112 Lab here to draw blood work.  LUE AVF with good thrill and bruit noted. Pink limb restriction band in place. A/completed see flow sheets.

## 2018-12-26 NOTE — Progress Notes (Signed)
BP 183/54 with map 99 after 10 mg Labetalol IVP.

## 2018-12-26 NOTE — Consult Note (Addendum)
Reason for Consult: To manage dialysis and dialysis related needs Referring Physician: Dr. Inis Sizer Shawna [name] is an 78 y.o. female.  HPI:  Pt is a 45F with a PMH significant for HTN, HLD, CAD, ESRD on HD, h/o GI bleeding 2/2 AVMs who is now seen in consultation at the request of Dr. Wyline Copas for management of ESRD and provision of HD.    History obtained from chart review as pt sleepy after long night.   Pt was in her usual state of health until she started feeling SOB the morning prior to admission.  Went to [facility] and was found to be hypotensive and SOB.  Found to be hypotensive and hypoxic.  Was placed on BiPaP with improvement.  Transferred here for further eval.    Once here, pt found to have Na 139, K 4.1, CO2 23, BUN 27, Cr 7.47, Ca 11.9, alb 3.2.  WBC 11.0, Hgb 9.3, Plts 216.  Trop 0.37.  EKG with nonspecific ST-T wave changes--> doesn't appear too different from EKG 6/6.  Of note, recently admitted to [facility] for chest pain attributed to hypertensive emergency.    Dialyzes at  [facility]  Past Medical History:  Diagnosis Date  . Acute on chronic respiratory failure with hypoxia ([place]) 10/10/2016  . Anxiety   . Arthritis   . AVM (arteriovenous malformation) of colon   . CAD (coronary artery disease)    a. s/p CABG in 2013 b. most recent DES to D1 in 10/2016 with cath showing patent LIMA-LAD, SVG-RI, SVG-Mrg and SVG-RCA  c. NST in 02/2018 showing scar with no significant ischemia  . Carotid artery disease ([place])    a. 46-27% LICA, 0/3500   . Chronic bronchitis ([place])   . Chronic diastolic CHF (congestive heart failure) ([place])    a. 02/2012 Echo EF 60-65%, nl wall motion, Gr 1 DD, mod MR  . Colon cancer ([place]) 1992  . Esophageal stricture   . ESRD on hemodialysis ([place])    ESRD due to HTN, started dialysis 2011 and gets HD at [facility] with Dr Hinda Lenis on MWF schedule.  Access is LUA AVF as of Sept 2014.   Marland Kitchen GERD (gastroesophageal reflux disease)   . High cholesterol 12/2011  .  History of blood transfusion 07/2011; 12/2011; 01/2012 X 2; 04/2012  . History of gout   . History of lower GI bleeding   . Hypertension   . Iron deficiency anemia   . Mitral regurgitation    a. Moderate by echo, 02/2012  . Myocardial infarction ([place])   . Ovarian cancer ([place]) 1992  . Pneumonia ~ 2009  . PUD (peptic ulcer disease)   . TIA (transient ischemic attack)     Past Surgical History:  Procedure Laterality Date  . ABDOMINAL HYSTERECTOMY  1992  . APPENDECTOMY  06/1990  . AV FISTULA PLACEMENT  07/2009   left upper arm  . AV FISTULA PLACEMENT Right 09/06/2016   Procedure: RIGHT FOREARM ARTERIOVENOUS (AV) GRAFT;  Surgeon: Elam Dutch, MD;  Location: [facility] OR;  Service: Vascular;  Laterality: Right;  . AV FISTULA PLACEMENT N/A 02/24/2017   Procedure: INSERTION OF ARTERIOVENOUS (AV) GORE-TEX GRAFT ARM (BRACHIAL AXILLARY);  Surgeon: Katha Cabal, MD;  Location: [facility] ORS;  Service: Vascular;  Laterality: N/A;  . Effort Right 09/06/2016   Procedure: REMOVAL OF Right Arm ARTERIOVENOUS GORETEX GRAFT and Vein Patch angioplasty of brachial artery;  Surgeon: Angelia Mould, MD;  Location: [place];  Service: Vascular;  Laterality: Right;  . COLON RESECTION  1992  . COLON SURGERY    . CORONARY ANGIOPLASTY WITH STENT PLACEMENT  12/15/11   "2"  . CORONARY ANGIOPLASTY WITH STENT PLACEMENT  y/2013   "1; makes total of 3" (05/02/2012)  . CORONARY ARTERY BYPASS GRAFT  06/13/2012   Procedure: CORONARY ARTERY BYPASS GRAFTING (CABG);  Surgeon: Grace Isaac, MD;  Location: [place];  Service: Open Heart Surgery;  Laterality: N/A;  cabg x four;  using left internal mammary artery, and left leg greater saphenous vein harvested endoscopically  . CORONARY STENT INTERVENTION N/A 10/13/2016   Procedure: Coronary Stent Intervention;  Surgeon: Troy Sine, MD;  Location: [place] CV LAB;  Service: Cardiovascular;  Laterality: N/A;  . DIALYSIS/PERMA CATHETER REMOVAL N/A 04/18/2017   Procedure:  DIALYSIS/PERMA CATHETER REMOVAL;  Surgeon: Katha Cabal, MD;  Location: [place] CV LAB;  Service: Cardiovascular;  Laterality: N/A;  . DILATION AND CURETTAGE OF UTERUS    . ESOPHAGOGASTRODUODENOSCOPY  01/20/2012   Procedure: ESOPHAGOGASTRODUODENOSCOPY (EGD);  Surgeon: Ladene Artist, MD,FACG;  Location: [facility] ENDOSCOPY;  Service: Endoscopy;  Laterality: N/A;  . ESOPHAGOGASTRODUODENOSCOPY N/A 03/26/2013   Procedure: ESOPHAGOGASTRODUODENOSCOPY (EGD);  Surgeon: Irene Shipper, MD;  Location: [facility] ENDOSCOPY;  Service: Endoscopy;  Laterality: N/A;  . ESOPHAGOGASTRODUODENOSCOPY N/A 04/30/2015   Procedure: ESOPHAGOGASTRODUODENOSCOPY (EGD);  Surgeon: Rogene Houston, MD;  [facility];  Service: Endoscopy;  Laterality: N/A;  1pm - moved to 10/20 @ 1:10  . ESOPHAGOGASTRODUODENOSCOPY N/A 07/29/2016   Procedure: ESOPHAGOGASTRODUODENOSCOPY (EGD);  Surgeon: Manus Gunning, MD;  Location: [place];  Service: Gastroenterology;  Laterality: N/A;  enteroscopy  . INTRAOPERATIVE TRANSESOPHAGEAL ECHOCARDIOGRAM  06/13/2012   Procedure: INTRAOPERATIVE TRANSESOPHAGEAL ECHOCARDIOGRAM;  Surgeon: Grace Isaac, MD;  Location: [place];  Service: Open Heart Surgery;  Laterality: N/A;  . IR GENERIC HISTORICAL  07/26/2016   IR FLUORO GUIDE CV LINE RIGHT 07/26/2016 Greggory Keen, MD [facility]  . IR GENERIC HISTORICAL  07/26/2016   IR US GUIDE VASC ACCESS RIGHT 07/26/2016 Greggory Keen, MD [facility]  . IR GENERIC HISTORICAL  08/02/2016   IR US GUIDE VASC ACCESS RIGHT 08/02/2016 Greggory Keen, MD [facility]  . IR GENERIC HISTORICAL  08/02/2016   IR FLUORO GUIDE CV LINE RIGHT 08/02/2016 Greggory Keen, MD [facility]  . IR RADIOLOGY PERIPHERAL GUIDED IV START  03/28/2017  . IR US GUIDE VASC ACCESS RIGHT  03/28/2017  . LEFT HEART CATH AND CORONARY ANGIOGRAPHY N/A 09/20/2016   Procedure: Left Heart Cath and Coronary Angiography;  Surgeon: [name], MD;  Location: [place] CV LAB;  Service:  Cardiovascular;  Laterality: N/A;  . LEFT HEART CATH AND CORS/GRAFTS ANGIOGRAPHY N/A 10/13/2016   Procedure: Left Heart Cath and Cors/Grafts Angiography;  Surgeon: Troy Sine, MD;  Location: [place] CV LAB;  Service: Cardiovascular;  Laterality: N/A;  . LEFT HEART CATH AND CORS/GRAFTS ANGIOGRAPHY N/A 07/13/2018   Procedure: LEFT HEART CATH AND CORS/GRAFTS ANGIOGRAPHY;  Surgeon: Martinique, Peter M, MD;  Location: [place] CV LAB;  Service: Cardiovascular;  Laterality: N/A;  . LEFT HEART CATHETERIZATION WITH CORONARY ANGIOGRAM N/A 12/15/2011   Procedure: LEFT HEART CATHETERIZATION WITH CORONARY ANGIOGRAM;  Surgeon: Burnell Blanks, MD;  Location: [facility] CATH LAB;  Service: Cardiovascular;  Laterality: N/A;  . LEFT HEART CATHETERIZATION WITH CORONARY ANGIOGRAM N/A 01/10/2012   Procedure: LEFT HEART CATHETERIZATION WITH CORONARY ANGIOGRAM;  Surgeon: Peter M Martinique, MD;  Location: [facility] CATH LAB;  Service: Cardiovascular;  Laterality:  N/A;  . LEFT HEART CATHETERIZATION WITH CORONARY ANGIOGRAM N/A 06/08/2012   Procedure: LEFT HEART CATHETERIZATION WITH CORONARY ANGIOGRAM;  Surgeon: Burnell Blanks, MD;  Location: [facility] CATH LAB;  Service: Cardiovascular;  Laterality: N/A;  . LEFT HEART CATHETERIZATION WITH CORONARY/GRAFT ANGIOGRAM N/A 12/10/2013   Procedure: LEFT HEART CATHETERIZATION WITH Beatrix Fetters;  Surgeon: [name], MD;  Location: [facility] CATH LAB;  Service: Cardiovascular;  Laterality: N/A;  . OVARY SURGERY     ovarian cancer  . REVISION OF ARTERIOVENOUS GORETEX GRAFT N/A 02/24/2017   Procedure: REVISION OF ARTERIOVENOUS GORETEX GRAFT (RESECTION);  Surgeon: Katha Cabal, MD;  Location: [facility] ORS;  Service: Vascular;  Laterality: N/A;  Earney Mallet N/A 10/15/2013   Procedure: Fistulogram;  Surgeon: Serafina Mitchell, MD;  Location: [facility] CATH LAB;  Service: Cardiovascular;  Laterality: N/A;  . THROMBECTOMY / ARTERIOVENOUS GRAFT REVISION  2011   left upper arm  . TUBAL LIGATION   1980's  . UPPER EXTREMITY ANGIOGRAPHY Bilateral 12/06/2016   Procedure: Upper Extremity Angiography;  Surgeon: Katha Cabal, MD;  Location: [place] CV LAB;  Service: Cardiovascular;  Laterality: Bilateral;  . UPPER EXTREMITY INTERVENTION Left 06/06/2017   Procedure: UPPER EXTREMITY INTERVENTION;  Surgeon: Katha Cabal, MD;  Location: [place] CV LAB;  Service: Cardiovascular;  Laterality: Left;    Family History  Problem Relation Age of Onset  . Heart disease Mother        Heart Disease before age 48  . Hyperlipidemia Mother   . Hypertension Mother   . Diabetes Mother   . Heart attack Mother   . Heart disease Father        Heart Disease before age 56  . Hyperlipidemia Father   . Hypertension Father   . Diabetes Father   . Diabetes Sister   . Hypertension Sister   . Diabetes Brother   . Hyperlipidemia Brother   . Heart attack Brother   . Hypertension Sister   . Heart attack Brother   . Other Other        noncontributory for early CAD  . Colon cancer Neg Hx   . Esophageal cancer Neg Hx   . Liver disease Neg Hx   . Kidney disease Neg Hx   . Colon polyps Neg Hx     Social History:  reports that she has never smoked. She has never used smokeless tobacco. She reports that she does not drink alcohol or use drugs.  Allergies:  Allergies  Allergen Reactions  . Aspirin Other (See Comments)    High Doses Mess up her stomach; "makes my bowels have blood in them". Takes 81 mg EC Aspirin   . Penicillins Other (See Comments)    SYNCOPE? , "makes me real weak when I take it; like I'll pass out"  Has patient had a PCN reaction causing immediate rash, facial/tongue/throat swelling, SOB or lightheadedness with hypotension: Yes Has patient had a PCN reaction causing severe rash involving mucus membranes or skin necrosis: no Has patient had a PCN reaction that required hospitalization no Has patient had a PCN reaction occurring within the last 10 years: no If all  of the above  . Amlodipine Swelling  . Bactrim [Sulfamethoxazole-Trimethoprim] Rash  . Contrast Media [Iodinated Diagnostic Agents] Itching  . Iron Itching and Other (See Comments)    "they gave me iron in dialysis; had to give me Benadryl cause I had to have the iron" (05/02/2012)  . Nitrofurantoin Hives  . Tylenol [Acetaminophen] Itching  and Other (See Comments)    Makes her feet on fire per pt  . Gabapentin Other (See Comments)    Unknown reaction  . Hydralazine Itching  . Dexilant [Dexlansoprazole] Other (See Comments)    Upset stomach  . Levaquin [Levofloxacin In D5w] Rash  . Morphine And Related Itching and Other (See Comments)    Itching in feet  . Plavix [Clopidogrel Bisulfate] Rash  . Protonix [Pantoprazole Sodium] Rash  . Venofer [Ferric Oxide] Itching and Other (See Comments)    Patient reports using Benadryl prior to doses as [facility]    Medications:  Scheduled: . ALPRAZolam  0.5 mg Oral Daily  . amiodarone  200 mg Oral Daily  . carvedilol  6.25 mg Oral BID WC  . Chlorhexidine Gluconate Cloth  6 each Topical Q0600  . heparin  5,000 Units Subcutaneous Q8H  . isosorbide mononitrate  60 mg Oral BID  . multivitamin  1 tablet Oral QHS  . pantoprazole  40 mg Oral Daily  . sevelamer carbonate  2,400 mg Oral BID WC  . simvastatin  20 mg Oral q1800  . ticagrelor  60 mg Oral BID    Results for orders placed or performed during the hospital encounter of 12/25/18 (from the past 48 hour(s))  Basic metabolic panel     Status: Abnormal   Collection Time: 12/25/18 10:16 PM  Result Value Ref Range   Sodium 139 135 - 145 mmol/L   Potassium 4.1 3.5 - 5.1 mmol/L   Chloride 100 98 - 111 mmol/L   CO2 23 22 - 32 mmol/L   Glucose, Bld 126 (H) 70 - 99 mg/dL   BUN 27 (H) 8 - 23 mg/dL   Creatinine, Ser 7.47 (H) 0.44 - 1.00 mg/dL   Calcium 11.9 (H) 8.9 - 10.3 mg/dL   GFR calc non Af Amer 5 (L) >60 mL/min   GFR calc Af Amer 5 (L) >60 mL/min   Anion gap 16 (H) 5 - 15     Comment: Performed at [facility], [address] [place], [place] [address]  CBC with Differential/Platelet     Status: Abnormal   Collection Time: 12/25/18 10:16 PM  Result Value Ref Range   WBC 11.0 (H) 4.0 - 10.5 K/uL   RBC 2.70 (L) 3.87 - 5.11 MIL/uL   Hemoglobin 9.3 (L) 12.0 - 15.0 g/dL   HCT 29.5 (L) 36.0 - 46.0 %   MCV 109.3 (H) 80.0 - 100.0 fL   MCH 34.4 (H) 26.0 - 34.0 pg   MCHC 31.5 30.0 - 36.0 g/dL   RDW 17.0 (H) 11.5 - 15.5 %   Platelets 216 150 - 400 K/uL   nRBC 0.0 0.0 - 0.2 %   Neutrophils Relative % 82 %   Neutro Abs 9.0 (H) 1.7 - 7.7 K/uL   Lymphocytes Relative 8 %   Lymphs Abs 0.9 0.7 - 4.0 K/uL   Monocytes Relative 7 %   Monocytes Absolute 0.8 0.1 - 1.0 K/uL   Eosinophils Relative 2 %   Eosinophils Absolute 0.2 0.0 - 0.5 K/uL   Basophils Relative 0 %   Basophils Absolute 0.0 0.0 - 0.1 K/uL   Immature Granulocytes 1 %   Abs Immature Granulocytes 0.06 0.00 - 0.07 K/uL    Comment: Performed at [facility], [place] [address], [place], [place] [address]  Type and screen [place]     Status: None (Preliminary result)   Collection Time: 12/25/18 10:16  PM  Result Value Ref Range   ABO/RH(D) O POS    Antibody Screen NEG    Sample Expiration      12/28/2018,2359 Performed at [facility], [place] [address], [place], [place] [address]    Unit Number [id number]    Blood Component Type RED CELLS,LR    Unit division 00    Status of Unit ALLOCATED    Donor AG Type NEGATIVE FOR KIDD B ANTIGEN    Transfusion Status OK TO TRANSFUSE    Crossmatch Result COMPATIBLE    Unit Number [id number]    Blood Component Type RED CELLS,LR    Unit division 00    Status of Unit ALLOCATED    Donor AG Type NEGATIVE FOR KIDD B ANTIGEN    Transfusion Status OK TO TRANSFUSE    Crossmatch Result COMPATIBLE   Hepatic function panel     Status: Abnormal   Collection Time: 12/25/18 10:16 PM  Result Value Ref Range   Total Protein 5.9 (L) 6.5 -  8.1 g/dL   Albumin 3.2 (L) 3.5 - 5.0 g/dL   AST 15 15 - 41 U/L   ALT 18 0 - 44 U/L   Alkaline Phosphatase 77 38 - 126 U/L   Total Bilirubin 0.7 0.3 - 1.2 mg/dL   Bilirubin, Direct 0.2 0.0 - 0.2 mg/dL   Indirect Bilirubin 0.5 0.3 - 0.9 mg/dL    Comment: Performed at [facility], [place] [address] [place], [place] [address]  Troponin I - Now Then Q6H     Status: Abnormal   Collection Time: 12/25/18 10:16 PM  Result Value Ref Range   Troponin I 0.37 (HH) <0.03 ng/mL    Comment: CRITICAL RESULT CALLED TO, READ BACK BY AND VERIFIED WITH: PIDGEON,E RN 12/25/2018 2340 JORDANS Performed at [facility], [place] [address], [place], [place] [address]   Glucose, capillary     Status: None   Collection Time: 12/25/18 10:23 PM  Result Value Ref Range   Glucose-Capillary 72 70 - 99 mg/dL  Troponin I - Now Then Q6H     Status: Abnormal   Collection Time: 12/26/18  2:39 AM  Result Value Ref Range   Troponin I 0.37 (HH) <0.03 ng/mL    Comment: CRITICAL VALUE NOTED.  VALUE IS CONSISTENT WITH PREVIOUSLY REPORTED AND CALLED VALUE. Performed at [facility], [place] [address], [place], [name] [address]     Dg Chest Port 1 View  Result Date: 12/25/2018 CLINICAL DATA:  Shortness of breath EXAM: PORTABLE CHEST 1 VIEW COMPARISON:  12/25/2018 FINDINGS: Heart size remains enlarged. The patient is status post prior median sternotomy. There is persistent volume overload. There are small bilateral pleural effusions, left greater than right. Bibasilar airspace opacities are favored to be secondary to pulmonary edema or atelectasis, less likely infiltrate. IMPRESSION: Persistent but improving congestive heart failure. Electronically Signed   By: Constance Holster M.D.   On: 12/25/2018 20:25    ROS: all other systems reviewed and are negative except as per HPI Blood pressure (!) 172/59, pulse 75, temperature 98.8 F (37.1 C), temperature source Oral, resp. rate (!) 23, height 5\' 4"  (1.626 m), weight  60.3 kg, SpO2 100 %. .  GEN: sleepy, arousable  HEENT EOMI PERRL MMM NECK + JVD PULM bilaterally anteriorly clear, bibasilar crackles CV RRR loud S2 ABD soft, nontender, nondistended EXT 1+ LE and UE edema SKIN no rashes/ lesions ACCESS LUE AVF + T/B  Assessment/Plan: 1 SOB/ hypoxia-  CXR looks like pulm edema.  COVID test pending.  Volume overloaded clinically.  Today is HD day.  Will provide healthy UF goal as BP still elevated 2 ESRD: MWF [facility] 3.  Hypercalcemia: Has been hypercalcemic since beginning of June will hold all VDRA and Ca, will check PTH, send SPEP/ serum free light chains, ? If needs  4 Hypertension: Expect to improve with UF 5 Anemia of ESRD: Hgb 9.3, would suggest only 1 u pRBCs if desired 6. Metabolic Bone Disease: As above in Hypercalcemia 7.  Dispo: admitted   [name] 12/26/2018, 12:18 PM

## 2018-12-27 LAB — NOVEL CORONAVIRUS, NAA (HOSP ORDER, SEND-OUT TO REF LAB; TAT 18-24 HRS): SARS-CoV-2, NAA: NOT DETECTED

## 2018-12-27 MED ORDER — CARVEDILOL 12.5 MG PO TABS
12.5000 mg | ORAL_TABLET | Freq: Two times a day (BID) | ORAL | Status: DC
  Administered 2018-12-27: 12.5 mg via ORAL
  Filled 2018-12-27: qty 1

## 2018-12-27 MED ORDER — CARVEDILOL 12.5 MG PO TABS: 12.5000 mg | ORAL_TABLET | Freq: Two times a day (BID) | ORAL | 0 refills | Status: DC

## 2018-12-27 MED ORDER — HYDRALAZINE HCL 20 MG/ML IJ SOLN: 10.0000 mg | INTRAMUSCULAR | Status: DC | PRN

## 2018-12-27 MED FILL — CARVEDILOL 12.5 MG TABLET: 12.5 | 30 days supply | Qty: 60 | Fill #0

## 2018-12-27 NOTE — TOC Transition Note (Signed)
Transition of Care [facility]) - CM/SW Discharge Note   Patient Details  Name: CRISTIN PENAFLOR MRN: [id number] Date of Birth: 04/19/1941  [facility] Care [facility]) CM/SW Contact:  Zenon Mayo, RN Phone Number: 12/27/2018, 2:18 PM   Clinical Narrative:    From home with spouse, ESRD patient MWF,today BP still high nephrology following, she had HD today.  She gave NCM permission to speak with her spouse, [name], he states they have no problem getting medications, he will transport her home when she is dc.  She has a cane and a walker at home and she gets around pretty well with those. She does not have any HH services at this time   Final next level of care: Home/Self Care Barriers to Discharge: No Barriers Identified   Patient Goals and CMS Choice Patient states their goals for this hospitalization and ongoing recovery are:: do more exercise   Choice offered to / list presented to : NA  Discharge Placement                       Discharge Plan and Services In-house Referral: NA Discharge Planning Services: CM Consult Post Acute Care Choice: NA          DME Arranged: (NA)         HH Arranged: NA          Social Determinants of Health (SDOH) Interventions     Readmission Risk Interventions Readmission Risk Prevention Plan 12/27/2018  Transportation Screening Complete  PCP or Specialist Appt within 3-5 Days Complete  HRI or [place] Complete  Social Work Consult for [place] Planning/Counseling Complete  Palliative Care Screening Not Applicable  Medication Review Press photographer) Complete  Some recent data might be hidden

## 2018-12-27 NOTE — TOC Initial Note (Signed)
Transition of Care [facility]) - Initial/Assessment Note    Patient Details  Name: Shawna [name] MRN: [id number] Date of Birth: [birth year]  Transition [facility]) CM/SW Contact:    Zenon Mayo, RN Phone Number: 12/27/2018, 1:31 PM  Clinical Narrative:                 From home with spouse, ESRD patient MWF,today BP still high nephrology following, she had HD today.  She gave NCM permission to speak with her spouse, [name], he states they have no problem getting medications, he will transport her home when she is dc.  She has a cane and a walker at home and she gets around pretty well with those. She does not have any HH services at this time.  Expected Discharge Plan: Home/Self Care Barriers to Discharge: No Barriers Identified   Patient Goals and CMS Choice Patient states their goals for this hospitalization and ongoing recovery are:: to exercise more   Choice offered to / list presented to : NA  Expected Discharge Plan and Services Expected Discharge Plan: Home/Self Care In-house Referral: NA Discharge Planning Services: CM Consult Post Acute Care Choice: NA Living arrangements for the past 2 months: Single Family Home                 DME Arranged: (NA)         HH Arranged: NA          Prior Living Arrangements/Services Living arrangements for the past 2 months: Single Family Home Lives with:: Spouse Patient language and need for interpreter reviewed:: Yes Do you feel safe going back to the place where you live?: Yes      Need for Family Participation in Patient Care: Yes (Comment) Care giver support system in place?: Yes (comment)   Criminal Activity/Legal Involvement Pertinent to Current Situation/Hospitalization: No - Comment as needed  Activities of Daily Living Home Assistive Devices/Equipment: Cane (specify quad or straight) ADL Screening (condition at time of admission) Patient's cognitive ability adequate to safely complete daily activities?:  Yes Is the patient deaf or have difficulty hearing?: No Does the patient have difficulty seeing, even when wearing glasses/contacts?: Yes Does the patient have difficulty concentrating, remembering, or making decisions?: No Patient able to express need for assistance with ADLs?: Yes Does the patient have difficulty dressing or bathing?: No Independently performs ADLs?: Yes (appropriate for developmental age) Does the patient have difficulty walking or climbing stairs?: No Weakness of Legs: Both Weakness of Arms/Hands: None  Permission Sought/Granted Permission sought to share information with : Family Supports Permission granted to share information with : Yes, Verbal Permission Granted  Share Information with NAME: Avonna Iribe     Permission granted to share info w Relationship: spouse  Permission granted to share info w Contact Information: Alexxa Sabet [phone]  Emotional Assessment Appearance:: Appears stated age Attitude/Demeanor/Rapport: Gracious Affect (typically observed): Appropriate Orientation: : Oriented to Self, Oriented to Place, Oriented to  Time, Oriented to Situation Alcohol / Substance Use: Not Applicable Psych Involvement: No (comment)  Admission diagnosis:  renal failuer anemia Patient Active Problem List   Diagnosis Date Noted  . Acute respiratory failure with hypoxia ([place]) 12/25/2018  . Elevated troponin 12/14/2018  . PAF (paroxysmal atrial fibrillation) ([place]) 08/06/2018  . Chest pain at rest 07/13/2018  . Hand steal syndrome ([place]) 08/01/2017  . Anemia 07/14/2017  . Coronary artery disease of bypass graft of native heart with stable angina pectoris ([place]) 06/05/2017  .  Mesenteric ischemia ([place])   . Diverticulitis   . SBO (small bowel obstruction) ([place]) 03/22/2017  . Enteritis   . Complication of vascular access for dialysis 03/19/2017  . Preoperative clearance 01/25/2017  . Symptomatic anemia 10/24/2016  . H/O non-ST elevation myocardial  infarction (NSTEMI) 10/24/2016  . Fluid overload 10/10/2016  . Complication from renal dialysis device 10/10/2016  . Non-ST elevation MI (NSTEMI) ([place])   . Encounter for fitting and adjustment of vascular catheter   . End stage renal disease ([place])   . ESRD (end stage renal disease) ([place])   . Heme positive stool   . Demand ischemia ([place]) 07/27/2016  . Hypertensive emergency 07/08/2016  . Acute on chronic respiratory failure with hypoxia ([place])   . Cardiac arrest ([place])   . Palliative care encounter   . Goals of care, counseling/discussion   . Hypertensive crisis without congestive heart failure 05/09/2016  . Acute pulmonary edema ([place]) 04/06/2016  . Acute respiratory failure ([place]) 04/06/2016  . Hypertensive crisis 01/27/2016  . History of colon cancer 01/27/2016  . History of ovarian cancer 01/27/2016  . Hypertensive urgency 01/27/2016  . Narrow complex tachycardia ([place]) 09/08/2015  . SVT (supraventricular tachycardia) ([place]) 09/08/2015  . Influenza A 08/30/2015  . Acute on chronic diastolic CHF (congestive heart failure) ([place]) 05/04/2015  . Unstable angina ([place]) 05/03/2015  . Essential hypertension   . Pain in joint, lower leg 08/14/2014  . Small bowel obstruction, partial ([employer]) 05/29/2013  . Chronic diastolic CHF (congestive heart failure) ([place]) 03/22/2013  . GI bleeding 03/21/2013  . Acute blood loss anemia 03/21/2013  . Vaginal odor 03/12/2013  . Vaginal discharge 03/12/2013  . Occlusion and stenosis of carotid artery without mention of cerebral infarction 01/24/2013  . Hx of CABG 07/05/2012  . Carotid artery disease ([place]) 07/05/2012  . Anemia of chronic kidney failure 07/03/2012  . Secondary hyperparathyroidism ([place]) 07/03/2012  . Mitral regurgitation 06/12/2012  . Pneumonia 06/09/2012  . Non-STEMI (non-ST elevated myocardial infarction) ([place]) 06/08/2012  . Ischemic chest pain ([place]) 03/01/2012  . 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 GERD  (gastroesophageal reflux disease) 01/09/2012  . HLD (hyperlipidemia) 01/05/2012  . Atherosclerosis of native coronary artery of native heart without angina pectoris 12/16/2011  . ESRD on hemodialysis ([place]) 12/16/2011   PCP:  [name], MD Pharmacy:   [facility] [address], [place] [address] Phone: [phone] Fax: [phone]  [facility] (ESRD Bundle Only) - [place], [address] [place] [address] Phone: [phone] Fax: [phone]     Social Determinants of Health ([place]) Interventions    Readmission Risk Interventions Readmission Risk Prevention Plan 12/27/2018  Transportation Screening Complete  PCP or Specialist Appt within 3-5 Days Complete  HRI or [place] Complete  Social Work Consult for [place] Planning/Counseling Complete  Palliative Care Screening Not Applicable  Medication Review Press photographer) Complete  Some recent data might be hidden

## 2018-12-27 NOTE — Progress Notes (Signed)
  [facility] Progress Note   Assessment/ Plan:   Assessment/Plan: 1 SOB/ hypoxia- CXR looks like pulm edema.  COVID test neg. Improved after HD.   2 ESRD: MWF [facility], next HD tomorrow if still here. 3.  Hypercalcemia: Has been hypercalcemic since beginning of June will hold all VDRA and Ca, will check PTH, send SPEP/ serum free light chains all pending, primary nephrologist can follow up as OP if not back at time of d/c. Non-Ca based binders recommended.   4 Hypertension: Expect to improve with UF 5 Anemia of ESRD: Hgb 9.3, would suggest only 1 u pRBCs if desired 6. Metabolic Bone Disease: As above in Hypercalcemia 7.  Dispo: pending  Subjective:    No events.  2.8L removed with HD yesterday.  Feeling much better.  Post-weight 59.2 kg.     Objective:   BP (!) 192/62 (BP Location: Right Arm)   Pulse 73   Temp 98.6 F (37 C) (Oral)   Resp 18   Ht 5\' 4"  (1.626 m)   Wt 59.8 kg   SpO2 100%   BMI 22.63 kg/m   Physical Exam: GEN: sleepy, arousable  HEENT EOMI PERRL MMM NECK JVD resolved  PULM bilaterally anteriorly clear, crackles resolved CV RRR loud S2 ABD soft, nontender, nondistended EXT no LE and UE edema SKIN no rashes/ lesions ACCESS LUE AVF + T/B Labs: BMET Recent Labs  Lab 12/25/18 2216 12/26/18 1445 12/26/18 2020  NA 139 136 139  K 4.1 4.0 3.3*  CL 100 98 97*  CO2 23 25 28   GLUCOSE 126* 95 87  BUN 27* 34* 9  CREATININE 7.47* 8.99* 3.90*  CALCIUM 11.9* 11.3* 11.1*  PHOS  --  6.7* 3.5   CBC Recent Labs  Lab 12/25/18 2216 12/26/18 1455 12/26/18 1650  WBC 11.0* 6.7 7.8  NEUTROABS 9.0*  --   --   HGB 9.3* 7.7* 8.9*  HCT 29.5* 24.8* 27.3*  MCV 109.3* 109.7* 108.3*  PLT 216 185 213    @IMGRELPRIORS @ Medications:    . ALPRAZolam  0.5 mg Oral Daily  . amiodarone  200 mg Oral Daily  . carvedilol  12.5 mg Oral BID WC  . Chlorhexidine Gluconate Cloth  6 each Topical Q0600  . heparin  5,000 Units Subcutaneous Q8H  . isosorbide  mononitrate  60 mg Oral BID  . multivitamin  1 tablet Oral QHS  . pantoprazole  40 mg Oral Daily  . sevelamer carbonate  2,400 mg Oral BID WC  . simvastatin  20 mg Oral q1800  . ticagrelor  60 mg Oral BID     [name] MD [facility] pgr [phone] 12/27/2018, 9:40 AM

## 2018-12-27 NOTE — Progress Notes (Signed)
Renal Navigator notified OP HD clinic/[facility] of patient's admission and negative COVID 19 test result in order to provide continuity of care and safety.  Alphonzo Cruise, [place] Renal Navigator [phone]

## 2018-12-27 NOTE — Discharge Summary (Signed)
Physician Discharge Summary  ANALIZA COWGER IRS:[id number] DOB: 01-Aug-1940 DOA: 12/25/2018  PCP: [name], MD  Admit date: 12/25/2018 Discharge date: 12/27/2018  Admitted From: Home Disposition:  Home  Recommendations for Outpatient Follow-up:  1. Follow up with PCP in 1-2 weeks 2. Follow up with scheduled HD  3. Please follow up on PTH, SPEP/serum free light chains 4. Recommend holding Ca based binders  Discharge Condition:Improved CODE STATUS:Full Diet recommendation: Renal  COVID test negative   Brief/Interim Summary: 78 y.o.femalewithhistory of ESRD on hemodialysis on Monday Wednesday Friday, CAD status post CABG and stent recent cardiac cath in January 2020, paroxysmal atrial fibrillation history of GI bleed secondary to AVMs recently admitted for chest pain managed medically and was attributed to uncontrolled blood pressure had her regular dialysis on Monday and is scheduled to have a blood transfusion started getting more short of breath and this morning. Had gone to the ER at [facility] found to be hypotensive and short of breath. Was placed on BiPAP initially and with blood pressure improving. Denies any chest pain productive cough fever chills nausea vomiting headache or any focal deficit. Patient was transferred to [facility] for further management including need for dialysis. By the time I examined the patient patient is on 2 L oxygen which also has been weaned off. Patient is lying down on the bed without any distress. On exam lungs appears clear. Blood pressure is elevated for which we will give some as needed IV medication along with patient's night dose of Imdur and Coreg. I have ordered a complete metabolic panel CBC EKG troponin chest x-ray all of which are pending.  Discharge Diagnoses:  Principal Problem:   Acute on chronic respiratory failure with hypoxia (HCC) Active Problems:   ESRD on hemodialysis ([place])   AVM (arteriovenous malformation) of  small bowel, acquired   Anemia of chronic kidney failure   Hx of CABG   Chronic diastolic CHF (congestive heart failure) (HCC)   Essential hypertension   PAF (paroxysmal atrial fibrillation) (HCC)   Acute respiratory failure with hypoxia ([place])  1. Acute respiratory failure with hypoxia likely from acute pulmonary edema secondary to fluid overload in ESRD patient 1. Chest imaging personally reviewed, findings consistent with volume overload 2. COVID testing, negative.  Pt is afebrile 3. Nephrology consulted, underwent HD 6/17 with marked improvement post-dialysis 4. Discussed with Nephrology. Stable for d/c today 2. History of CAD status post CABG and stent placement last cardiac cath in January 2020 1. Troponin remained stable 2. No chest pain 3. Hypertensive urgency likely contributing to patient's symptoms.  1. Continued with home medications including Coreg Imdur 2. BP remained suboptimally controlled and coreg increased to 12.5mg  with improvement in BP 4. ESRD on hemodialysis on Monday Wednesday and Friday 1. Nephrology consulted per above 2. Clinically improved post-dialysis 5. Paroxysmal atrial fibrillation on Coreg.  1. Not on anticoagulation likely from chronic GI bleed.  2. Patient stable on amiodarone. 6. Anemia likely from renal disease. 1. Remained stable 7. History of GI bleed secondary to AVMs. 1. Stable at this time 8. Hypercalcemia 1. PTH, SPEP/serum free light chains pending 2. Nephrology has recommended outpatient follow up on these labs  Discharge Instructions   Allergies as of 12/27/2018      Reactions   Aspirin Other (See Comments)   High Doses Mess up her stomach; "makes my bowels have blood in them". Takes 81 mg EC Aspirin    Penicillins Other (See Comments)   SYNCOPE? , "makes me  real weak when I take it; like I'll pass out" Has patient had a PCN reaction causing immediate rash, facial/tongue/throat swelling, SOB or lightheadedness with  hypotension: Yes Has patient had a PCN reaction causing severe rash involving mucus membranes or skin necrosis: no Has patient had a PCN reaction that required hospitalization no Has patient had a PCN reaction occurring within the last 10 years: no If all of the above   Amlodipine Swelling   Bactrim [sulfamethoxazole-trimethoprim] Rash   Contrast Media [iodinated Diagnostic Agents] Itching   Iron Itching, Other (See Comments)   "they gave me iron in dialysis; had to give me Benadryl cause I had to have the iron" (05/02/2012)   Nitrofurantoin Hives   Tylenol [acetaminophen] Itching, Other (See Comments)   Makes her feet on fire per pt   Gabapentin Other (See Comments)   Unknown reaction   Hydralazine Itching   Dexilant [dexlansoprazole] Other (See Comments)   Upset stomach   Levaquin [levofloxacin In D5w] Rash   Morphine And Related Itching, Other (See Comments)   Itching in feet   Plavix [clopidogrel Bisulfate] Rash   Protonix [pantoprazole Sodium] Rash   Venofer [ferric Oxide] Itching, Other (See Comments)   Patient reports using Benadryl prior to doses as [place]      Medication List    TAKE these medications   ALPRAZolam 0.5 MG tablet Commonly known as: XANAX Take 0.5 mg by mouth daily.   amiodarone 200 MG tablet Commonly known as: PACERONE Take 1 tablet (200 mg total) by mouth daily.   carvedilol 12.5 MG tablet Commonly known as: COREG Take 1 tablet (12.5 mg total) by mouth 2 (two) times daily with a meal for 30 days. Additional refills per PCP What changed:   medication strength  how much to take  additional instructions   diphenhydrAMINE 25 mg capsule Commonly known as: BENADRYL Take 50 mg by mouth daily as needed for itching or allergies.   fluticasone 50 MCG/ACT nasal spray Commonly known as: FLONASE Place 1 spray at bedtime as needed into both nostrils for allergies.   isosorbide mononitrate 60 MG 24 hr tablet Commonly known as: IMDUR Take 1  tablet (60 mg total) by mouth 2 (two) times a day for 30 days.   lidocaine-prilocaine cream Commonly known as: EMLA Apply 1 application topically every Monday, Wednesday, and Friday. Prior to dialysis   loratadine 10 MG tablet Commonly known as: CLARITIN Take 1 tablet (10 mg total) by mouth daily as needed for allergies.   multivitamin Tabs tablet Take 1 tablet by mouth daily.   nitroGLYCERIN 0.4 MG SL tablet Commonly known as: NITROSTAT DISSOLVE ONE TABLET UNDER THE TONGUE EVERY 5 MINUTES AS NEEDED FOR CHEST PAIN.  DO NOT EXCEED A TOTAL OF 3 DOSES IN 15 MINUTES What changed: See the new instructions.   omeprazole 20 MG capsule Commonly known as: PRILOSEC Take 1 capsule (20 mg total) by mouth daily.   sevelamer carbonate 800 MG tablet Commonly known as: RENVELA Take 800-2,400 mg by mouth See admin instructions. Take 3 tablets (2400) by mouth twice daily with meals, take 1 tablet (800 mg) with snacks   simvastatin 20 MG tablet Commonly known as: ZOCOR TAKE ONE TABLET (20 MG) BY MOUTH DAILY AT BEDTIME What changed:   how much to take  how to take this  when to take this   ticagrelor 60 MG Tabs tablet Commonly known as: Brilinta Take 1 tablet (60 mg total) by mouth 2 (two) times daily.  Follow-up Information    [name], MD Follow up.   Specialty: Family Medicine Contact information: [address] [place]  [address] [phone]        Follow up with dialysis as scheduled Follow up.          Allergies  Allergen Reactions  . Aspirin Other (See Comments)    High Doses Mess up her stomach; "makes my bowels have blood in them". Takes 81 mg EC Aspirin   . Penicillins Other (See Comments)    SYNCOPE? , "makes me real weak when I take it; like I'll pass out"  Has patient had a PCN reaction causing immediate rash, facial/tongue/throat swelling, SOB or lightheadedness with hypotension: Yes Has patient had a PCN reaction causing severe rash involving mucus  membranes or skin necrosis: no Has patient had a PCN reaction that required hospitalization no Has patient had a PCN reaction occurring within the last 10 years: no If all of the above  . Amlodipine Swelling  . Bactrim [Sulfamethoxazole-Trimethoprim] Rash  . Contrast Media [Iodinated Diagnostic Agents] Itching  . Iron Itching and Other (See Comments)    "they gave me iron in dialysis; had to give me Benadryl cause I had to have the iron" (05/02/2012)  . Nitrofurantoin Hives  . Tylenol [Acetaminophen] Itching and Other (See Comments)    Makes her feet on fire per pt  . Gabapentin Other (See Comments)    Unknown reaction  . Hydralazine Itching  . Dexilant [Dexlansoprazole] Other (See Comments)    Upset stomach  . Levaquin [Levofloxacin In D5w] Rash  . Morphine And Related Itching and Other (See Comments)    Itching in feet  . Plavix [Clopidogrel Bisulfate] Rash  . Protonix [Pantoprazole Sodium] Rash  . Venofer [Ferric Oxide] Itching and Other (See Comments)    Patient reports using Benadryl prior to doses as [place]    Consultations:  Nephrology  Procedures/Studies: Dg Chest Port 1 View  Result Date: 12/25/2018 CLINICAL DATA:  Shortness of breath EXAM: PORTABLE CHEST 1 VIEW COMPARISON:  12/25/2018 FINDINGS: Heart size remains enlarged. The patient is status post prior median sternotomy. There is persistent volume overload. There are small bilateral pleural effusions, left greater than right. Bibasilar airspace opacities are favored to be secondary to pulmonary edema or atelectasis, less likely infiltrate. IMPRESSION: Persistent but improving congestive heart failure. Electronically Signed   By: Constance Holster M.D.   On: 12/25/2018 20:25   Dg Chest Port 1 View  Result Date: 12/14/2018 CLINICAL DATA:  Left chest pain EXAM: PORTABLE CHEST 1 VIEW COMPARISON:  10/02/2018 FINDINGS: The heart size is enlarged. The patient is status post prior median sternotomy. There is volume  overload with small bilateral pleural effusions. There are emphysematous changes bilaterally. No pneumothorax. No large area of consolidation. There is likely atelectasis versus scarring at the lung bases. IMPRESSION: Cardiomegaly with volume overload and findings suspicious for developing trace to small pleural effusions. Electronically Signed   By: Constance Holster M.D.   On: 12/14/2018 02:21     Subjective: Eager to go home  Discharge Exam: Vitals:   12/27/18 0741 12/27/18 1141  BP: (!) 192/62 (!) 161/44  Pulse: 73 80  Resp: 18 (!) 24  Temp: 98.6 F (37 C) 98.3 F (36.8 C)  SpO2: 100% 100%   Vitals:   12/27/18 0348 12/27/18 0559 12/27/18 0741 12/27/18 1141  BP: (!) 142/91  (!) 192/62 (!) 161/44  Pulse: 77 81 73 80  Resp: 18 (!) 24  18 (!) 24  Temp: 97.8 F (36.6 C)  98.6 F (37 C) 98.3 F (36.8 C)  TempSrc: Oral  Oral Oral  SpO2: 100% 98% 100% 100%  Weight:  59.8 kg    Height:        General: Pt is alert, awake, not in acute distress Cardiovascular: RRR, S1/S2 +, no rubs, no gallops Respiratory: CTA bilaterally, no wheezing, no rhonchi Abdominal: Soft, NT, ND, bowel sounds + Extremities: no edema, no cyanosis   The results of significant diagnostics from this hospitalization (including imaging, microbiology, ancillary and laboratory) are listed below for reference.     Microbiology: Recent Results (from the past 240 hour(s))  Novel Coronavirus,NAA,(SEND-OUT TO REF LAB - TAT 24-48 hrs); Hosp Order     Status: None   Collection Time: 12/25/18  8:50 PM   Specimen: Nasopharyngeal Swab; Respiratory  Result Value Ref Range Status   SARS-CoV-2, NAA NOT DETECTED NOT DETECTED Final    Comment: (NOTE) This test was developed and its performance characteristics determined by [employer]. This test has not been FDA cleared or approved. This test has been authorized by FDA under an Emergency Use Authorization (EUA). This test is only authorized for the duration  of time the declaration that circumstances exist justifying the authorization of the emergency use of in vitro diagnostic tests for detection of SARS-CoV-2 virus and/or diagnosis of COVID-19 infection under section 564(b)(1) of the Act, 21 U.S.C. 003KJZ-7(H)(1), unless the authorization is terminated or revoked sooner. When diagnostic testing is negative, the possibility of a false negative result should be considered in the context of a patient's recent exposures and the presence of clinical signs and symptoms consistent with COVID-19. An individual without symptoms of COVID-19 and who is not shedding SARS-CoV-2 virus would expect to have a negative (not detected) result in this assay. Performed  At: [facility] [address] [place], [place] [id number] Rush Farmer MD AX:[id number]    [place]  Final    Comment: Performed at [facility], [place] [address] [place], [place] [address]     Labs: BNP (last 3 results) No results for input(s): BNP in the last 8760 hours. Basic Metabolic Panel: Recent Labs  Lab 12/25/18 2216 12/26/18 1445 12/26/18 2020  NA 139 136 139  K 4.1 4.0 3.3*  CL 100 98 97*  CO2 23 25 28   GLUCOSE 126* 95 87  BUN 27* 34* 9  CREATININE 7.47* 8.99* 3.90*  CALCIUM 11.9* 11.3* 11.1*  PHOS  --  6.7* 3.5   Liver Function Tests: Recent Labs  Lab 12/25/18 2216 12/26/18 1445 12/26/18 2020  AST 15  --   --   ALT 18  --   --   ALKPHOS 77  --   --   BILITOT 0.7  --   --   PROT 5.9*  --   --   ALBUMIN 3.2* 2.8* 3.5   No results for input(s): LIPASE, AMYLASE in the last 168 hours. No results for input(s): AMMONIA in the last 168 hours. CBC: Recent Labs  Lab 12/25/18 2216 12/26/18 1455 12/26/18 1650  WBC 11.0* 6.7 7.8  NEUTROABS 9.0*  --   --   HGB 9.3* 7.7* 8.9*  HCT 29.5* 24.8* 27.3*  MCV 109.3* 109.7* 108.3*  PLT 216 185 213   Cardiac Enzymes: Recent Labs  Lab 12/25/18 2216 12/26/18 0239  TROPONINI  0.37* 0.37*   BNP: Invalid input(s): POCBNP CBG: Recent Labs  Lab 12/25/18 [place]  72   D-Dimer No results for input(s): DDIMER in the last 72 hours. Hgb A1c No results for input(s): HGBA1C in the last 72 hours. Lipid Profile No results for input(s): CHOL, HDL, LDLCALC, TRIG, CHOLHDL, LDLDIRECT in the last 72 hours. Thyroid function studies No results for input(s): TSH, T4TOTAL, T3FREE, THYROIDAB in the last 72 hours.  Invalid input(s): FREET3 Anemia work up No results for input(s): VITAMINB12, FOLATE, FERRITIN, TIBC, IRON, RETICCTPCT in the last 72 hours. Urinalysis    Component Value Date/Time   COLORURINE YELLOW 12/16/2012 1919   APPEARANCEUR CLOUDY (A) 12/16/2012 1919   LABSPEC 1.009 12/16/2012 1919   PHURINE 7.5 12/16/2012 1919   GLUCOSEU NEGATIVE 12/16/2012 1919   HGBUR TRACE (A) 12/16/2012 1919   BILIRUBINUR NEGATIVE 12/16/2012 1919   KETONESUR NEGATIVE 12/16/2012 1919   PROTEINUR 100 (A) 12/16/2012 1919   UROBILINOGEN 0.2 12/16/2012 1919   NITRITE NEGATIVE 12/16/2012 1919   LEUKOCYTESUR SMALL (A) 12/16/2012 1919   Sepsis Labs Invalid input(s): PROCALCITONIN,  WBC,  LACTICIDVEN Microbiology Recent Results (from the past 240 hour(s))  Novel Coronavirus,NAA,(SEND-OUT TO REF LAB - TAT 24-48 hrs); Hosp Order     Status: None   Collection Time: 12/25/18  8:50 PM   Specimen: Nasopharyngeal Swab; Respiratory  Result Value Ref Range Status   SARS-CoV-2, NAA NOT DETECTED NOT DETECTED Final    Comment: (NOTE) This test was developed and its performance characteristics determined by [employer]. This test has not been FDA cleared or approved. This test has been authorized by FDA under an Emergency Use Authorization (EUA). This test is only authorized for the duration of time the declaration that circumstances exist justifying the authorization of the emergency use of in vitro diagnostic tests for detection of SARS-CoV-2 virus and/or diagnosis of  COVID-19 infection under section 564(b)(1) of the Act, 21 U.S.C. 962EZM-6(Q)(9), unless the authorization is terminated or revoked sooner. When diagnostic testing is negative, the possibility of a false negative result should be considered in the context of a patient's recent exposures and the presence of clinical signs and symptoms consistent with COVID-19. An individual without symptoms of COVID-19 and who is not shedding SARS-CoV-2 virus would expect to have a negative (not detected) result in this assay. Performed  At: [facility] [address] [place], [place] [id number] Rush Farmer MD TW:[id number]    [place]  Final    Comment: Performed at [facility], [place] [address], [place], [place] [address]   Time spent: 30 min  SIGNED:   Marylu Lund, MD  [facility] 12/27/2018, 1:52 PM  If 7PM-7AM, please contact night-coverage

## 2018-12-27 NOTE — Consult Note (Signed)
   [facility] CM Inpatient Consult   12/27/2018  Shawna [name] March 08, 1941 [id number]   Patient screened for high risk score for unplanned readmission score  and/or for hospitalizations to check if potential [place] Management services are needed.  Review of patient's medical record reveals patient is 78 year old admitted to [facility] from [facility] for acute on chronic respiratory failure with hypoxia.  Primary Care Provider is  [name], MD  Transportation to provider: husband   Plan:  Follow patient for needs.    Please place a [facility] Management consult as appropriate and for questions contact:   Natividad Brood, RN BSN [facility] Liaison  [phone] business mobile phone Toll free office [phone]  Fax number: [phone] [name]

## 2018-12-28 LAB — BPAM RBC
Blood Product Expiration Date: 202007182359
Blood Product Expiration Date: 202007182359
Unit Type and Rh: 5100
Unit Type and Rh: 5100

## 2018-12-28 LAB — KAPPA/LAMBDA LIGHT CHAINS
Kappa free light chain: 95.3 mg/L — ABNORMAL HIGH (ref 3.3–19.4)
Kappa, lambda light chain ratio: 0.64 (ref 0.26–1.65)
Lambda free light chains: 149.1 mg/L — ABNORMAL HIGH (ref 5.7–26.3)

## 2018-12-28 LAB — TYPE AND SCREEN
ABO/RH(D): O POS
Antibody Screen: NEGATIVE
Donor AG Type: NEGATIVE
Donor AG Type: NEGATIVE
Unit division: 0
Unit division: 0

## 2018-12-28 LAB — PROTEIN ELECTROPHORESIS, SERUM
A/G Ratio: 1.4 (ref 0.7–1.7)
Albumin ELP: 3.8 g/dL (ref 2.9–4.4)
Alpha-1-Globulin: 0.3 g/dL (ref 0.0–0.4)
Alpha-2-Globulin: 0.9 g/dL (ref 0.4–1.0)
Beta Globulin: 0.7 g/dL (ref 0.7–1.3)
Gamma Globulin: 0.8 g/dL (ref 0.4–1.8)
Globulin, Total: 2.8 g/dL (ref 2.2–3.9)
Total Protein ELP: 6.6 g/dL (ref 6.0–8.5)

## 2018-12-28 LAB — VITAMIN D 25 HYDROXY (VIT D DEFICIENCY, FRACTURES): Vit D, 25-Hydroxy: 8.6 ng/mL — ABNORMAL LOW (ref 30.0–100.0)

## 2018-12-29 LAB — PARATHYROID HORMONE, INTACT (NO CA): PTH: 46 pg/mL (ref 15–65)

## 2018-12-31 ENCOUNTER — Ambulatory Visit (INDEPENDENT_AMBULATORY_CARE_PROVIDER_SITE_OTHER): Payer: Medicare HMO | Admitting: Internal Medicine

## 2019-01-01 ENCOUNTER — Ambulatory Visit (INDEPENDENT_AMBULATORY_CARE_PROVIDER_SITE_OTHER): Payer: Medicare HMO | Admitting: Internal Medicine

## 2019-01-01 ENCOUNTER — Encounter (INDEPENDENT_AMBULATORY_CARE_PROVIDER_SITE_OTHER): Payer: Self-pay | Admitting: Internal Medicine

## 2019-01-01 ENCOUNTER — Other Ambulatory Visit: Payer: Self-pay

## 2019-01-01 ENCOUNTER — Ambulatory Visit (INDEPENDENT_AMBULATORY_CARE_PROVIDER_SITE_OTHER): Payer: Medicare HMO | Admitting: Urology

## 2019-01-01 VITALS — BP 183/73 | HR 77 | Temp 98.4°F | Ht 62.0 in | Wt 130.3 lb

## 2019-01-01 DIAGNOSIS — D3002 Benign neoplasm of left kidney: Secondary | ICD-10-CM | POA: Diagnosis not present

## 2019-01-01 DIAGNOSIS — K625 Hemorrhage of anus and rectum: Secondary | ICD-10-CM

## 2019-01-01 NOTE — Progress Notes (Signed)
Subjective:    Patient ID: Shawna [name], female    DOB: 1941-04-04, 78 y.o.   MRN: [id number]  HPI Referred by Dr. Nadara Mustard for rectal bleeding. Has been having dark/maroon stools for about a week. Having been having some dizziness.  She tells me today when she has a BM, she has been seeing blood. Her stools are dark red in color.  Her appetite has remained good.    Recently [facility] ([facility] from [facility]) with acute on chronic respiratory failure with hypoxia.   12/26/2018 Hemoglobin 8.9 12/21/2018 Hemoglobin 7.7.   Maintained on Brilinta. Hx of MI and has a cardiac stent.  Hx of colon cancer back in 1992.  Takes Dialysis M-W-F     She underwent an EGD in January of 2018Showed a 2 cm hiatal hernia. Normal stomach. A single duodenal versus jejunal polyp- suspect adenoma, not removed. A single angiodysplastic lesion in the jejunum. Treated with argon plasma coagulation . (Anemia secondary to blood loss). Dr. Havery Moros. Hx of GI bleed due to small bowel angiodysplasia.  Dialysis M-W-F. Has 2 cardiac stents, hx of TIA, PAF and maintained on Brilinta.   Last colonoscopy in 2013 by Dr. Britta Mccreedy, Heme positive stool, anemia.  Two polyps in cecum removed. Moderate diverticulosis thru out the colon. Surgical anastomosis, patent, involving the DC to rectum. Sessile polyp in the descending colon. All polyps were tubular adenoma.  CBC Latest Ref Rng & Units 12/26/2018 12/26/2018 12/25/2018  WBC 4.0 - 10.5 K/uL 7.8 6.7 11.0(H)  Hemoglobin 12.0 - 15.0 g/dL 8.9(L) 7.7(L) 9.3(L)  Hematocrit 36.0 - 46.0 % 27.3(L) 24.8(L) 29.5(L)  Platelets 150 - 400 K/uL 213 185 216     Review of Systems Past Medical History:  Diagnosis Date  . Acute on chronic respiratory failure with hypoxia ([place]) 10/10/2016  . Anxiety   . Arthritis   . AVM (arteriovenous malformation) of colon   . CAD (coronary artery disease)    a. s/p CABG in 2013 b. most recent DES to D1 in 10/2016  with cath showing patent LIMA-LAD, SVG-RI, SVG-Mrg and SVG-RCA  c. NST in 02/2018 showing scar with no significant ischemia  . Carotid artery disease ([place])    a. 12-87% LICA, 02/6766   . Chronic bronchitis ([place])   . Chronic diastolic CHF (congestive heart failure) ([place])    a. 02/2012 Echo EF 60-65%, nl wall motion, Gr 1 DD, mod MR  . Colon cancer ([place]) 1992  . Esophageal stricture   . ESRD on hemodialysis ([place])    ESRD due to HTN, started dialysis 2011 and gets HD at [facility] with Dr Hinda Lenis on MWF schedule.  Access is LUA AVF as of Sept 2014.   Marland Kitchen GERD (gastroesophageal reflux disease)   . High cholesterol 12/2011  . History of blood transfusion 07/2011; 12/2011; 01/2012 X 2; 04/2012  . History of gout   . History of lower GI bleeding   . Hypertension   . Iron deficiency anemia   . Mitral regurgitation    a. Moderate by echo, 02/2012  . Myocardial infarction ([place])   . Ovarian cancer ([place]) 1992  . Pneumonia ~ 2009  . PUD (peptic ulcer disease)   . TIA (transient ischemic attack)     Past Surgical History:  Procedure Laterality Date  . ABDOMINAL HYSTERECTOMY  1992  . APPENDECTOMY  06/1990  . AV FISTULA PLACEMENT  07/2009   left upper arm  . AV FISTULA PLACEMENT Right 09/06/2016   Procedure: RIGHT  FOREARM ARTERIOVENOUS (AV) GRAFT;  Surgeon: Elam Dutch, MD;  Location: [facility] OR;  Service: Vascular;  Laterality: Right;  . AV FISTULA PLACEMENT N/A 02/24/2017   Procedure: INSERTION OF ARTERIOVENOUS (AV) GORE-TEX GRAFT ARM (BRACHIAL AXILLARY);  Surgeon: Katha Cabal, MD;  Location: [facility] ORS;  Service: Vascular;  Laterality: N/A;  . [place] Right 09/06/2016   Procedure: REMOVAL OF Right Arm ARTERIOVENOUS GORETEX GRAFT and Vein Patch angioplasty of brachial artery;  Surgeon: Angelia Mould, MD;  Location: [place];  Service: Vascular;  Laterality: Right;  . COLON RESECTION  1992  . COLON SURGERY    . CORONARY ANGIOPLASTY WITH STENT PLACEMENT  12/15/11   "2"  . CORONARY  ANGIOPLASTY WITH STENT PLACEMENT  y/2013   "1; makes total of 3" (05/02/2012)  . CORONARY ARTERY BYPASS GRAFT  06/13/2012   Procedure: CORONARY ARTERY BYPASS GRAFTING (CABG);  Surgeon: Grace Isaac, MD;  Location: [place];  Service: Open Heart Surgery;  Laterality: N/A;  cabg x four;  using left internal mammary artery, and left leg greater saphenous vein harvested endoscopically  . CORONARY STENT INTERVENTION N/A 10/13/2016   Procedure: Coronary Stent Intervention;  Surgeon: Troy Sine, MD;  Location: [place] CV LAB;  Service: Cardiovascular;  Laterality: N/A;  . DIALYSIS/PERMA CATHETER REMOVAL N/A 04/18/2017   Procedure: DIALYSIS/PERMA CATHETER REMOVAL;  Surgeon: Katha Cabal, MD;  Location: [place] CV LAB;  Service: Cardiovascular;  Laterality: N/A;  . DILATION AND CURETTAGE OF UTERUS    . ESOPHAGOGASTRODUODENOSCOPY  01/20/2012   Procedure: ESOPHAGOGASTRODUODENOSCOPY (EGD);  Surgeon: Ladene Artist, MD,FACG;  Location: [facility] ENDOSCOPY;  Service: Endoscopy;  Laterality: N/A;  . ESOPHAGOGASTRODUODENOSCOPY N/A 03/26/2013   Procedure: ESOPHAGOGASTRODUODENOSCOPY (EGD);  Surgeon: Irene Shipper, MD;  Location: [facility] ENDOSCOPY;  Service: Endoscopy;  Laterality: N/A;  . ESOPHAGOGASTRODUODENOSCOPY N/A 04/30/2015   Procedure: ESOPHAGOGASTRODUODENOSCOPY (EGD);  Surgeon: Rogene Houston, MD;  [facility];  Service: Endoscopy;  Laterality: N/A;  1pm - moved to 10/20 @ 1:10  . ESOPHAGOGASTRODUODENOSCOPY N/A 07/29/2016   Procedure: ESOPHAGOGASTRODUODENOSCOPY (EGD);  Surgeon: Manus Gunning, MD;  Location: [place];  Service: Gastroenterology;  Laterality: N/A;  enteroscopy  . INTRAOPERATIVE TRANSESOPHAGEAL ECHOCARDIOGRAM  06/13/2012   Procedure: INTRAOPERATIVE TRANSESOPHAGEAL ECHOCARDIOGRAM;  Surgeon: Grace Isaac, MD;  Location: [place];  Service: Open Heart Surgery;  Laterality: N/A;  . IR GENERIC HISTORICAL  07/26/2016   IR FLUORO GUIDE CV LINE RIGHT 07/26/2016 Greggory Keen, MD [facility]-INTER[facility]  . IR GENERIC HISTORICAL  07/26/2016   IR US GUIDE VASC ACCESS RIGHT 07/26/2016 Greggory Keen, MD [facility]  . IR GENERIC HISTORICAL  08/02/2016   IR US GUIDE VASC ACCESS RIGHT 08/02/2016 Greggory Keen, MD [facility]  . IR GENERIC HISTORICAL  08/02/2016   IR FLUORO GUIDE CV LINE RIGHT 08/02/2016 Greggory Keen, MD [facility]  . IR RADIOLOGY PERIPHERAL GUIDED IV START  03/28/2017  . IR US GUIDE VASC ACCESS RIGHT  03/28/2017  . LEFT HEART CATH AND CORONARY ANGIOGRAPHY N/A 09/20/2016   Procedure: Left Heart Cath and Coronary Angiography;  Surgeon: Shawna Crome, MD;  Location: [place] CV LAB;  Service: Cardiovascular;  Laterality: N/A;  . LEFT HEART CATH AND CORS/GRAFTS ANGIOGRAPHY N/A 10/13/2016   Procedure: Left Heart Cath and Cors/Grafts Angiography;  Surgeon: Troy Sine, MD;  Location: [place] CV LAB;  Service: Cardiovascular;  Laterality: N/A;  . LEFT HEART CATH AND CORS/GRAFTS ANGIOGRAPHY N/A 07/13/2018   Procedure: LEFT HEART CATH  AND CORS/GRAFTS ANGIOGRAPHY;  Surgeon: Martinique, Peter M, MD;  Location: [place] CV LAB;  Service: Cardiovascular;  Laterality: N/A;  . LEFT HEART CATHETERIZATION WITH CORONARY ANGIOGRAM N/A 12/15/2011   Procedure: LEFT HEART CATHETERIZATION WITH CORONARY ANGIOGRAM;  Surgeon: Burnell Blanks, MD;  Location: [facility] CATH LAB;  Service: Cardiovascular;  Laterality: N/A;  . LEFT HEART CATHETERIZATION WITH CORONARY ANGIOGRAM N/A 01/10/2012   Procedure: LEFT HEART CATHETERIZATION WITH CORONARY ANGIOGRAM;  Surgeon: Peter M Martinique, MD;  Location: [facility] CATH LAB;  Service: Cardiovascular;  Laterality: N/A;  . LEFT HEART CATHETERIZATION WITH CORONARY ANGIOGRAM N/A 06/08/2012   Procedure: LEFT HEART CATHETERIZATION WITH CORONARY ANGIOGRAM;  Surgeon: Burnell Blanks, MD;  Location: [facility] CATH LAB;  Service: Cardiovascular;  Laterality: N/A;  . LEFT HEART CATHETERIZATION WITH CORONARY/GRAFT ANGIOGRAM N/A 12/10/2013   Procedure: LEFT HEART  CATHETERIZATION WITH Beatrix Fetters;  Surgeon: [name], MD;  Location: [facility] CATH LAB;  Service: Cardiovascular;  Laterality: N/A;  . OVARY SURGERY     ovarian cancer  . REVISION OF ARTERIOVENOUS GORETEX GRAFT N/A 02/24/2017   Procedure: REVISION OF ARTERIOVENOUS GORETEX GRAFT (RESECTION);  Surgeon: Katha Cabal, MD;  Location: [facility] ORS;  Service: Vascular;  Laterality: N/A;  Earney Mallet N/A 10/15/2013   Procedure: Fistulogram;  Surgeon: Serafina Mitchell, MD;  Location: [facility] CATH LAB;  Service: Cardiovascular;  Laterality: N/A;  . THROMBECTOMY / ARTERIOVENOUS GRAFT REVISION  2011   left upper arm  . TUBAL LIGATION  1980's  . UPPER EXTREMITY ANGIOGRAPHY Bilateral 12/06/2016   Procedure: Upper Extremity Angiography;  Surgeon: Katha Cabal, MD;  Location: [place] CV LAB;  Service: Cardiovascular;  Laterality: Bilateral;  . UPPER EXTREMITY INTERVENTION Left 06/06/2017   Procedure: UPPER EXTREMITY INTERVENTION;  Surgeon: Katha Cabal, MD;  Location: [place] CV LAB;  Service: Cardiovascular;  Laterality: Left;    Allergies  Allergen Reactions  . Aspirin Other (See Comments)    High Doses Mess up her stomach; "makes my bowels have blood in them". Takes 81 mg EC Aspirin   . Penicillins Other (See Comments)    SYNCOPE? , "makes me real weak when I take it; like I'll pass out"  Has patient had a PCN reaction causing immediate rash, facial/tongue/throat swelling, SOB or lightheadedness with hypotension: Yes Has patient had a PCN reaction causing severe rash involving mucus membranes or skin necrosis: no Has patient had a PCN reaction that required hospitalization no Has patient had a PCN reaction occurring within the last 10 years: no If all of the above  . Amlodipine Swelling  . Bactrim [Sulfamethoxazole-Trimethoprim] Rash  . Contrast Media [Iodinated Diagnostic Agents] Itching  . Iron Itching and Other (See Comments)    "they gave me iron in  dialysis; had to give me Benadryl cause I had to have the iron" (05/02/2012)  . Nitrofurantoin Hives  . Tylenol [Acetaminophen] Itching and Other (See Comments)    Makes her feet on fire per pt  . Gabapentin Other (See Comments)    Unknown reaction  . Hydralazine Itching  . Dexilant [Dexlansoprazole] Other (See Comments)    Upset stomach  . Levaquin [Levofloxacin In D5w] Rash  . Morphine And Related Itching and Other (See Comments)    Itching in feet  . Plavix [Clopidogrel Bisulfate] Rash  . Protonix [Pantoprazole Sodium] Rash  . Venofer [Ferric Oxide] Itching and Other (See Comments)    Patient reports using Benadryl prior to doses as [place]  Medications on File Prior to Visit  Medication Sig Dispense Refill  . ALPRAZolam (XANAX) 0.5 MG tablet Take 0.5 mg by mouth daily.    Marland Kitchen amiodarone (PACERONE) 200 MG tablet Take 1 tablet (200 mg total) by mouth daily. 30 tablet 0  . carvedilol (COREG) 12.5 MG tablet Take 1 tablet (12.5 mg total) by mouth 2 (two) times daily with a meal for 30 days. Additional refills per PCP 60 tablet 0  . diphenhydrAMINE (BENADRYL) 25 mg capsule Take 50 mg by mouth daily as needed for itching or allergies.     . fluticasone (FLONASE) 50 MCG/ACT nasal spray Place 1 spray at bedtime as needed into both nostrils for allergies.     . isosorbide mononitrate (IMDUR) 60 MG 24 hr tablet Take 1 tablet (60 mg total) by mouth 2 (two) times a day for 30 days. 60 tablet 3  . lidocaine-prilocaine (EMLA) cream Apply 1 application topically every Monday, Wednesday, and Friday. Prior to dialysis    . loratadine (CLARITIN) 10 MG tablet Take 1 tablet (10 mg total) by mouth daily as needed for allergies. 30 tablet 0  . multivitamin (RENA-VIT) TABS tablet Take 1 tablet by mouth daily.    . nitroGLYCERIN (NITROSTAT) 0.4 MG SL tablet DISSOLVE ONE TABLET UNDER THE TONGUE EVERY 5 MINUTES AS NEEDED FOR CHEST PAIN.  DO NOT EXCEED A TOTAL OF 3 DOSES IN 15 MINUTES  (Patient taking differently: Place 0.4 mg under the tongue every 5 (five) minutes as needed. ) 25 tablet 2  . omeprazole (PRILOSEC) 20 MG capsule Take 1 capsule (20 mg total) by mouth daily. 90 capsule 3  . sevelamer carbonate (RENVELA) 800 MG tablet Take 800-2,400 mg by mouth See admin instructions. Take 3 tablets (2400) by mouth twice daily with meals, take 1 tablet (800 mg) with snacks    . simvastatin (ZOCOR) 20 MG tablet TAKE ONE TABLET (20 MG) BY MOUTH DAILY AT BEDTIME (Patient taking differently: Take 20 mg by mouth daily at 6 PM. TAKE ONE TABLET (20 MG) BY MOUTH DAILY AT BEDTIME) 90 tablet 3  . ticagrelor (BRILINTA) 60 MG TABS tablet Take 1 tablet (60 mg total) by mouth 2 (two) times daily. 60 tablet 6   No current facility-administered medications on file prior to visit.         Objective:   Physical Exam Blood pressure (!) 183/73, pulse 77, temperature 98.4 F (36.9 C), height 5\' 2"  (1.575 m), weight 130 lb 4.8 oz (59.1 kg). Alert and oriented. Skin warm and dry. Oral mucosa is moist.   . Sclera anicteric, conjunctivae is pink. Thyroid not enlarged. No cervical lymphadenopathy. Lungs clear. Heart regular rate and rhythm.  Abdomen is soft. Bowel sounds are positive. No hepatomegaly. No abdominal masses felt. No tenderness.  No edema to lower extremities. Stool brown and guaiac negative.         Assessment & Plan:  Rectal bleeding. She was guaiac negative in office.  She will go to lab and have an H and H.  Further recommendations to follow.

## 2019-01-01 NOTE — Patient Instructions (Signed)
Go the lab and have blood work drawn.

## 2019-01-02 DIAGNOSIS — N184 Chronic kidney disease, stage 4 (severe): Secondary | ICD-10-CM | POA: Diagnosis not present

## 2019-01-02 DIAGNOSIS — K922 Gastrointestinal hemorrhage, unspecified: Secondary | ICD-10-CM | POA: Diagnosis not present

## 2019-01-02 DIAGNOSIS — I639 Cerebral infarction, unspecified: Secondary | ICD-10-CM | POA: Diagnosis not present

## 2019-01-02 DIAGNOSIS — I259 Chronic ischemic heart disease, unspecified: Secondary | ICD-10-CM | POA: Diagnosis not present

## 2019-01-02 DIAGNOSIS — D638 Anemia in other chronic diseases classified elsewhere: Secondary | ICD-10-CM | POA: Diagnosis not present

## 2019-01-03 LAB — HEMOGLOBIN AND HEMATOCRIT, BLOOD
HCT: 26.7 % — ABNORMAL LOW (ref 35.0–45.0)
Hemoglobin: 8.4 g/dL — ABNORMAL LOW (ref 11.7–15.5)

## 2019-01-07 ENCOUNTER — Other Ambulatory Visit (INDEPENDENT_AMBULATORY_CARE_PROVIDER_SITE_OTHER): Payer: Self-pay | Admitting: *Deleted

## 2019-01-07 ENCOUNTER — Telehealth (INDEPENDENT_AMBULATORY_CARE_PROVIDER_SITE_OTHER): Payer: Self-pay | Admitting: Internal Medicine

## 2019-01-07 DIAGNOSIS — R195 Other fecal abnormalities: Secondary | ICD-10-CM

## 2019-01-07 NOTE — Telephone Encounter (Signed)
Tammy, H and H in 2 weeks. Please send letter

## 2019-01-07 NOTE — Telephone Encounter (Signed)
H&H noted for 2 weeks and a letter along with the order has been mailed to patient.

## 2019-01-08 ENCOUNTER — Other Ambulatory Visit (HOSPITAL_COMMUNITY): Payer: Self-pay | Admitting: Urology

## 2019-01-08 ENCOUNTER — Other Ambulatory Visit: Payer: Self-pay | Admitting: Urology

## 2019-01-08 DIAGNOSIS — D3001 Benign neoplasm of right kidney: Secondary | ICD-10-CM

## 2019-01-22 ENCOUNTER — Telehealth: Payer: Self-pay | Admitting: Cardiovascular Disease

## 2019-01-22 ENCOUNTER — Ambulatory Visit: Payer: Medicare HMO | Admitting: Cardiovascular Disease

## 2019-01-22 NOTE — Telephone Encounter (Signed)
Patient reminded of apt 7/22 with Maryla Morrow at the [facility] office

## 2019-01-22 NOTE — Telephone Encounter (Signed)
Pt is returning call to [facility]

## 2019-01-29 NOTE — Progress Notes (Signed)
Cardiology Office Note:    Date:  01/30/2019   ID:  CHALSEY LEETH, DOB Feb 21, 1941, MRN [id number]  PCP:  [name], MD  Cardiologist:  Kate Sable, MD  Referring MD: [name], MD   Chief Complaint  Patient presents with   Follow-up    CAD, Hypertension    History of Present Illness:    ROZA CREAMER is a 78 y.o. female with a past medical history significant for hypertension, CAD s/p CABG 2013, ESRD on hemodialysis M-W-F, carotid artery disease, diastolic CHF, PAF, history of GI bleed secondary to AVMs, NSVT on amiodarone.  Ms Auxier was admitted to [facility] in 07/2018 with chest pain. EKG showed new ST depression and she was transferred to [facility] for cardiac cath which showed patent LIMA-LAD, patent SVG x3.  There was newly occluded diagonal and progression of disease in the PDA. Her blood pressure was significantly elevated at the time and recommendation was made to work on blood pressure control and volume management.  Plan was that if chest pain was refractory could consider PCI to the PDA.  It was noted that in general she has not responded well to stenting in the past with all prior stents occluded Coreg was increased.   Ms Costantino was hospitalized in June with chest pain and seen in consultation by Dr. Bronson Ing.  Her prior T wave depressions were resolved.  Troponins remained low and flat.  she was maintaining sinus rhythm on amiodarone.  Echocardiogram showed EF 60-65% with mild concentric LV hypertrophy, diastolic dysfunction and elevated LV end-diastolic pressure.  No RWMA. She had accelerated hypertension.  She was noted to have self decreased her Imdur from 120 mg to 60 mg due to perceived hair loss and weight loss.  Dr Bronson Ing recommended medical management and convinced her to take Imdur 60 mg twice daily.  She is being seen today for follow up. She has just come from dialysis. She is not having any chest pain. She may have some mild chest pressure when she  is bent over pulling up her stockings getting ready for church. It resolves with NTG. She denies shortness of breath. She is now taking Imdur as directed 60 mg twice a day. She thinks her chest pain is a little better. She has an occ heart flutter not sustained. She denies lightheadedness, syncope, orthopnea. She has mild pedal edema, worse after being up all day, improves with elevating feet.   She says that her BP has been doing better over the last 2 weeks although she rambles a bit and cannot tell me exactly what her blood pressure has been running at dialysis.  She is very concerned about not getting her blood pressure too low as she says she tends to pass out or they have to cut her dialysis treatments short.  Past Medical History:  Diagnosis Date   Acute on chronic respiratory failure with hypoxia ([place]) 10/10/2016   Anxiety    Arthritis    AVM (arteriovenous malformation) of colon    CAD (coronary artery disease)    a. s/p CABG in 2013 b. most recent DES to D1 in 10/2016 with cath showing patent LIMA-LAD, SVG-RI, SVG-Mrg and SVG-RCA  c. NST in 02/2018 showing scar with no significant ischemia   Carotid artery disease (HCC)    a. 77-93% LICA, 03/299    Chronic bronchitis (HCC)    Chronic diastolic CHF (congestive heart failure) ([place])    a. 02/2012 Echo EF 60-65%, nl wall motion,  Gr 1 DD, mod MR   Colon cancer ([place]) 1992   Esophageal stricture    ESRD on hemodialysis ([place])    ESRD due to HTN, started dialysis 2011 and gets HD at [facility] with Dr Hinda Lenis on MWF schedule.  Access is LUA AVF as of Sept 2014.    GERD (gastroesophageal reflux disease)    High cholesterol 12/2011   History of blood transfusion 07/2011; 12/2011; 01/2012 X 2; 04/2012   History of gout    History of lower GI bleeding    Hypertension    Iron deficiency anemia    Mitral regurgitation    a. Moderate by echo, 02/2012   Myocardial infarction ([place])    Ovarian cancer ([place]) 1992   Pneumonia ~  2009   PUD (peptic ulcer disease)    TIA (transient ischemic attack)     Past Surgical History:  Procedure Laterality Date   ABDOMINAL HYSTERECTOMY  1992   APPENDECTOMY  06/1990   AV FISTULA PLACEMENT  07/2009   left upper arm   AV FISTULA PLACEMENT Right 09/06/2016   Procedure: RIGHT FOREARM ARTERIOVENOUS (AV) GRAFT;  Surgeon: Elam Dutch, MD;  Location: [place];  Service: Vascular;  Laterality: Right;   AV FISTULA PLACEMENT N/A 02/24/2017   Procedure: INSERTION OF ARTERIOVENOUS (AV) GORE-TEX GRAFT ARM (BRACHIAL AXILLARY);  Surgeon: Katha Cabal, MD;  Location: [facility];  Service: Vascular;  Laterality: N/A;   [place] Right 09/06/2016   Procedure: REMOVAL OF Right Arm ARTERIOVENOUS GORETEX GRAFT and Vein Patch angioplasty of brachial artery;  Surgeon: Angelia Mould, MD;  Location: [place];  Service: Vascular;  Laterality: Right;   [place] WITH STENT PLACEMENT  12/15/11   "2"   CORONARY ANGIOPLASTY WITH STENT PLACEMENT  y/2013   "1; makes total of 3" (05/02/2012)   CORONARY ARTERY BYPASS GRAFT  06/13/2012   Procedure: CORONARY ARTERY BYPASS GRAFTING (CABG);  Surgeon: Grace Isaac, MD;  Location: [place];  Service: Open Heart Surgery;  Laterality: N/A;  cabg x four;  using left internal mammary artery, and left leg greater saphenous vein harvested endoscopically   CORONARY STENT INTERVENTION N/A 10/13/2016   Procedure: Coronary Stent Intervention;  Surgeon: Troy Sine, MD;  Location: [place] CV LAB;  Service: Cardiovascular;  Laterality: N/A;   DIALYSIS/PERMA CATHETER REMOVAL N/A 04/18/2017   Procedure: DIALYSIS/PERMA CATHETER REMOVAL;  Surgeon: Katha Cabal, MD;  Location: [place] CV LAB;  Service: Cardiovascular;  Laterality: N/A;   DILATION AND CURETTAGE OF UTERUS     ESOPHAGOGASTRODUODENOSCOPY  01/20/2012   Procedure: ESOPHAGOGASTRODUODENOSCOPY (EGD);  Surgeon: Ladene Artist,  MD,FACG;  Location: [facility] ENDOSCOPY;  Service: Endoscopy;  Laterality: N/A;   ESOPHAGOGASTRODUODENOSCOPY N/A 03/26/2013   Procedure: ESOPHAGOGASTRODUODENOSCOPY (EGD);  Surgeon: Irene Shipper, MD;  Location: [facility];  Service: Endoscopy;  Laterality: N/A;   ESOPHAGOGASTRODUODENOSCOPY N/A 04/30/2015   Procedure: ESOPHAGOGASTRODUODENOSCOPY (EGD);  Surgeon: Rogene Houston, MD;  [facility];  Service: Endoscopy;  Laterality: N/A;  1pm - moved to 10/20 @ 1:10   ESOPHAGOGASTRODUODENOSCOPY N/A 07/29/2016   Procedure: ESOPHAGOGASTRODUODENOSCOPY (EGD);  Surgeon: Manus Gunning, MD;  Location: [place];  Service: Gastroenterology;  Laterality: N/A;  enteroscopy   INTRAOPERATIVE TRANSESOPHAGEAL ECHOCARDIOGRAM  06/13/2012   Procedure: INTRAOPERATIVE TRANSESOPHAGEAL ECHOCARDIOGRAM;  Surgeon: Grace Isaac, MD;  Location: [place];  Service: Open Heart Surgery;  Laterality: N/A;   IR GENERIC HISTORICAL  07/26/2016   IR FLUORO GUIDE CV LINE RIGHT 07/26/2016 Greggory Keen, MD [facility]   IR GENERIC HISTORICAL  07/26/2016   IR US GUIDE VASC ACCESS RIGHT 07/26/2016 Greggory Keen, MD [facility]   IR GENERIC HISTORICAL  08/02/2016   IR US GUIDE VASC ACCESS RIGHT 08/02/2016 Greggory Keen, MD [facility]   IR GENERIC HISTORICAL  08/02/2016   IR FLUORO GUIDE CV LINE RIGHT 08/02/2016 Greggory Keen, MD [facility]   IR RADIOLOGY PERIPHERAL GUIDED IV START  03/28/2017   IR US GUIDE VASC ACCESS RIGHT  03/28/2017   LEFT HEART CATH AND CORONARY ANGIOGRAPHY N/A 09/20/2016   Procedure: Left Heart Cath and Coronary Angiography;  Surgeon: [name], MD;  Location: [place] CV LAB;  Service: Cardiovascular;  Laterality: N/A;   LEFT HEART CATH AND CORS/GRAFTS ANGIOGRAPHY N/A 10/13/2016   Procedure: Left Heart Cath and Cors/Grafts Angiography;  Surgeon: Troy Sine, MD;  Location: [place] CV LAB;  Service: Cardiovascular;  Laterality: N/A;   LEFT HEART CATH AND CORS/GRAFTS  ANGIOGRAPHY N/A 07/13/2018   Procedure: LEFT HEART CATH AND CORS/GRAFTS ANGIOGRAPHY;  Surgeon: Martinique, Peter M, MD;  Location: [place] CV LAB;  Service: Cardiovascular;  Laterality: N/A;   LEFT HEART CATHETERIZATION WITH CORONARY ANGIOGRAM N/A 12/15/2011   Procedure: LEFT HEART CATHETERIZATION WITH CORONARY ANGIOGRAM;  Surgeon: Burnell Blanks, MD;  Location: [facility] CATH LAB;  Service: Cardiovascular;  Laterality: N/A;   LEFT HEART CATHETERIZATION WITH CORONARY ANGIOGRAM N/A 01/10/2012   Procedure: LEFT HEART CATHETERIZATION WITH CORONARY ANGIOGRAM;  Surgeon: Peter M Martinique, MD;  Location: [facility] CATH LAB;  Service: Cardiovascular;  Laterality: N/A;   LEFT HEART CATHETERIZATION WITH CORONARY ANGIOGRAM N/A 06/08/2012   Procedure: LEFT HEART CATHETERIZATION WITH CORONARY ANGIOGRAM;  Surgeon: Burnell Blanks, MD;  Location: [facility] CATH LAB;  Service: Cardiovascular;  Laterality: N/A;   LEFT HEART CATHETERIZATION WITH CORONARY/GRAFT ANGIOGRAM N/A 12/10/2013   Procedure: LEFT HEART CATHETERIZATION WITH Beatrix Fetters;  Surgeon: [name], MD;  Location: [facility] CATH LAB;  Service: Cardiovascular;  Laterality: N/A;   OVARY SURGERY     ovarian cancer   REVISION OF ARTERIOVENOUS GORETEX GRAFT N/A 02/24/2017   Procedure: REVISION OF ARTERIOVENOUS GORETEX GRAFT (RESECTION);  Surgeon: Katha Cabal, MD;  Location: [facility];  Service: Vascular;  Laterality: N/A;   SHUNTOGRAM N/A 10/15/2013   Procedure: Fistulogram;  Surgeon: Serafina Mitchell, MD;  Location: [facility] CATH LAB;  Service: Cardiovascular;  Laterality: N/A;   THROMBECTOMY / ARTERIOVENOUS GRAFT REVISION  2011   left upper arm   TUBAL LIGATION  1980's   UPPER EXTREMITY ANGIOGRAPHY Bilateral 12/06/2016   Procedure: Upper Extremity Angiography;  Surgeon: Katha Cabal, MD;  Location: [place] CV LAB;  Service: Cardiovascular;  Laterality: Bilateral;   UPPER EXTREMITY INTERVENTION Left 06/06/2017   Procedure: UPPER  EXTREMITY INTERVENTION;  Surgeon: Katha Cabal, MD;  Location: [place] CV LAB;  Service: Cardiovascular;  Laterality: Left;    Current Medications: Current Meds  Medication Sig   ALPRAZolam (XANAX) 0.5 MG tablet Take 0.5 mg by mouth daily.   amiodarone (PACERONE) 200 MG tablet Take 1 tablet (200 mg total) by mouth daily.   diphenhydrAMINE (BENADRYL) 25 mg capsule Take 50 mg by mouth daily as needed for itching or allergies.    fluticasone (FLONASE) 50 MCG/ACT nasal spray Place 1 spray at bedtime as needed into both nostrils for allergies.    lidocaine-prilocaine (EMLA) cream Apply 1 application topically every Monday, Wednesday, and  Friday. Prior to dialysis   loratadine (CLARITIN) 10 MG tablet Take 1 tablet (10 mg total) by mouth daily as needed for allergies.   multivitamin (RENA-VIT) TABS tablet Take 1 tablet by mouth daily.   nitroGLYCERIN (NITROSTAT) 0.4 MG SL tablet DISSOLVE ONE TABLET UNDER THE TONGUE EVERY 5 MINUTES AS NEEDED FOR CHEST PAIN.  DO NOT EXCEED A TOTAL OF 3 DOSES IN 15 MINUTES (Patient taking differently: Place 0.4 mg under the tongue every 5 (five) minutes as needed. )   omeprazole (PRILOSEC) 20 MG capsule Take 1 capsule (20 mg total) by mouth daily.   sevelamer carbonate (RENVELA) 800 MG tablet Take 800-2,400 mg by mouth See admin instructions. Take 3 tablets (2400) by mouth twice daily with meals, take 1 tablet (800 mg) with snacks   simvastatin (ZOCOR) 20 MG tablet TAKE ONE TABLET (20 MG) BY MOUTH DAILY AT BEDTIME (Patient taking differently: Take 20 mg by mouth daily at 6 PM. TAKE ONE TABLET (20 MG) BY MOUTH DAILY AT BEDTIME)   ticagrelor (BRILINTA) 60 MG TABS tablet Take 1 tablet (60 mg total) by mouth 2 (two) times daily.     Allergies:   Aspirin, Penicillins, Amlodipine, Bactrim [sulfamethoxazole-trimethoprim], Contrast media [iodinated diagnostic agents], Iron, Nitrofurantoin, Tylenol [acetaminophen], Gabapentin, Hydralazine, Dexilant  [dexlansoprazole], Levaquin [levofloxacin in d5w], Morphine and related, Plavix [clopidogrel bisulfate], Protonix [pantoprazole sodium], and Venofer [ferric oxide]   Social History   Socioeconomic History   Marital status: Married    Spouse name: Not on file   Number of children: Not on file   Years of education: Not on file   Highest education level: Not on file  Occupational History   Not on file  Social Needs   Financial resource strain: Not very hard   Food insecurity    Worry: Never true    Inability: Never true   Transportation needs    Medical: No    Non-medical: No  Tobacco Use   Smoking status: Never Smoker   Smokeless tobacco: Never Used  Substance and Sexual Activity   Alcohol use: No    Alcohol/week: 0.0 standard drinks   Drug use: No   Sexual activity: Yes    Birth control/protection: Surgical  Lifestyle   Physical activity    Days per week: Patient refused    Minutes per session: Patient refused   Stress: Only a little  Relationships   Social connections    Talks on phone: Patient refused    Gets together: Patient refused    Attends religious service: Patient refused    Active member of club or organization: Patient refused    Attends meetings of clubs or organizations: Patient refused    Relationship status: Patient refused  Other Topics Concern   Not on file  Social History Narrative   Lives in [place], [place] with husband.  Dialysis pt - mwf.     Family History: The patient's family history includes Diabetes in her brother, father, mother, and sister; Heart attack in her brother, brother, and mother; Heart disease in her father and mother; Hyperlipidemia in her brother, father, and mother; Hypertension in her father, mother, sister, and sister; Other in an other family member. There is no history of Colon cancer, Esophageal cancer, Liver disease, Kidney disease, or Colon polyps. ROS:   Please see the history of present illness.     All  other systems reviewed and are negative.  EKGs/Labs/Other Studies Reviewed:    The following studies were reviewed today:  Echocardiogram 12/14/2018  IMPRESSIONS  1. The left ventricle has normal systolic function with an ejection fraction of 60-65%. The cavity size was normal. There is mild concentric left ventricular hypertrophy. Left ventricular diastolic Doppler parameters are consistent with impaired  relaxation. Elevated left ventricular end-diastolic pressure No evidence of left ventricular regional wall motion abnormalities.  2. Left atrial size was mildly dilated.  3. The mitral valve is grossly normal. Mild thickening of the mitral valve leaflet. There is mild mitral annular calcification present. Mitral valve regurgitation is mild to moderate by color flow Doppler.  4. The tricuspid valve is grossly normal.  5. The aortic valve is tricuspid. Mild thickening of the aortic valve.  6. Moderate aortic annular calcification.  7. The aortic root is normal in size and structure.   LEFT HEART CATH AND CORS/GRAFTS ANGIOGRAPHY  07/13/2018  Conclusion   Ost RPDA to RPDA lesion is 75% stenosed.  Mid LM to Dist LM lesion is 60% stenosed.  Ost Ramus to Ramus lesion is 80% stenosed.  Ost Cx to Mid Cx lesion is 99% stenosed.  Prox LAD lesion is 90% stenosed.  Ost 1st Diag to 1st Diag lesion is 100% stenosed.  Ost RCA to Prox RCA lesion is 100% stenosed.  LIMA graft was visualized by angiography and is large.  The graft exhibits no disease.  SVG graft was visualized by angiography and is large.  The graft exhibits no disease.  SVG graft was visualized by angiography and is normal in caliber.  SVG graft was visualized by angiography and is large.  The graft exhibits no disease.  The left ventricular systolic function is normal.  LV end diastolic pressure is mildly elevated.  The left ventricular ejection fraction is 55-65% by visual estimate.   1. Severe 3 vessel  occlusive CAD    - 60% distal left main    - 90% mid LAD    - 100% first diagonal at prior stent site    - 99% proximal LCx at prior stent site    - 80% ramus intermediate.    - 100% ostial RCA at prior stent site    - 75% proximal PDA 2. Patent LIMA to the LAD 3. Patent SVG to ramus intermediate 4. Patent SVG to OM 5. Patent SVG to the distal RCA. 6. Good LV function 7. Mildly elevated LVEDP  Plan: Compared to prior cath in April 2018 the diagonal is now occluded. There is progression of disease in the PDA. It is unclear if this is the reason for her chest pain. She is severely hypertensive and EDP is mildly elevated. I would recommend medical management with good BP control. If she has refractory angina despite good BP and volume management we could consider PCI of the PDA but this would have to be done through the SVG and in general she has not responded well to stenting in the past with all prior stents occluded. I will increase her Coreg dose. She will need dialysis in am.       EKG:  EKG is not ordered today.   Recent Labs: 12/14/2018: Magnesium 2.0 12/25/2018: ALT 18 12/26/2018: BUN 9; Creatinine, Ser 3.90; Platelets 213; Potassium 3.3; Sodium 139 01/03/2019: Hemoglobin 8.4   Recent Lipid Panel    Component Value Date/Time   CHOL 93 10/16/2017 0539   TRIG 56 10/16/2017 0539   HDL 56 10/16/2017 0539   CHOLHDL 1.7 10/16/2017 0539   VLDL 11 10/16/2017 0539   LDLCALC 26 10/16/2017 0539    Physical  Exam:    VS:  BP (!) 170/64    Pulse 64    Temp 99.4 F (37.4 C)    Ht 5\' 1"  (1.549 m)    Wt 127 lb (57.6 kg)    SpO2 96%    BMI 24.00 kg/m     Wt Readings from Last 3 Encounters:  01/30/19 127 lb (57.6 kg)  01/01/19 130 lb 4.8 oz (59.1 kg)  12/27/18 131 lb 13.4 oz (59.8 kg)     Physical Exam  Constitutional: She is oriented to person, place, and time. She appears well-developed and well-nourished. No distress.  Thin, frail elderly female  HENT:  Head: Normocephalic  and atraumatic.  Neck: Normal range of motion. Neck supple. No JVD present.  Cardiovascular: Normal rate, regular rhythm, normal heart sounds and intact distal pulses. Exam reveals no gallop and no friction rub.  No murmur heard. Pulmonary/Chest: Effort normal and breath sounds normal. No respiratory distress. She has no wheezes. She has no rales.  Abdominal: Soft. Bowel sounds are normal.  Musculoskeletal:        General: Edema present.     Comments: Trace ankle edema  Neurological: She is alert and oriented to person, place, and time.  Skin: Skin is warm and dry.  Psychiatric: She has a normal mood and affect. Judgment and thought content normal.  The patient tends to ramble a bit and sometimes difficult to focus.  Vitals reviewed.    ASSESSMENT:    1. Essential (primary) hypertension   2. Coronary artery disease of native artery of native heart with stable angina pectoris ([place])   3. Mixed hyperlipidemia   4. PAF (paroxysmal atrial fibrillation) ([place])   5. ESRD on hemodialysis ([place])   6. Chronic diastolic CHF (congestive heart failure) (HCC)    PLAN:    In order of problems listed above:  Essential hypertension -Hospitalized on June for evaluation of chest pain and had accelerated hypertension. She had self decreased her Imdur from 120 mg to 60 mg due to perceived hair loss. During hospitalization she agreed to try 60 mg BID. She has also stopped hydralazine and clonidine in the past as she has a fear of her BP getting too low.  -She says that she is taking all of her medications as directed. -Blood pressure is elevated today.   I would increase her carvedilol however her heart rate is only 64. I am requesting blood pressure readings from her recent dialysis sessions to help guide any adjustments in therapy.  The patient is very concerned about her blood pressure dropping with dialysis as this seems to cause her to pass out or have her session cut short.  I have explained to her  that we really want to control her blood pressure so she does not end up in the hospital as she has several times with very high blood pressure. -We have provided her with a blood pressure log for her to use with home blood pressure and with dialysis sessions so that we can have a better record of her daily blood pressures.  She is instructed to bring this with her to her appointment. -Will have her f/u closely with Dr. Bronson Ing in 3 months.   CAD with stable angina -S/P CABG 2013, NSTEMI with DES to diagonal 10/2016. Cardiac cath in 07/2018 showed occluded diagonal and progressive disease of the PDA, 75%. Grafts were patent and normal LV function.  -Per cath notes in 07/2018: recommendation for medical management with good BP control.  If she has refractory angina despite good BP and volume management we could consider PCI of the PDA but in general she has not responded well to stenting in the past with all prior stents occluded.  -On BB and Imdur, statin, Ticagrelor (did not tolerate Plavix) and prn NTG.  She is now taking Imdur 60 mg twice a day and feels like her chest pain is a little better, chest pain only occurs rarely and is relieved with 1 sublingual nitroglycerin. -Echo in June showed normal LVEF and no RWMA.   Hyperlipidemia -On simvastatin  -LDL was 26 in 10/2017. Continue current therapy.  History of PAF/NSVT -On amiodarone. This had been stopped in the 06/2018 due to c/o hair loss and wt loss, however was resumed in the hospital in 07/2018.  -Only having a rare palpitation, nothing sustained. -Not on anticoagulation due to prior GI bleed.   ESRD -On HD M,W,F.   Chronic diastolic CHF -Volume management per nephrology. Seems to be euvolemic.    Medication Adjustments/Labs and Tests Ordered: Current medicines are reviewed at length with the patient today.  Concerns regarding medicines are outlined above. Labs and tests ordered and medication changes are outlined in the patient  instructions below:  Patient Instructions  Medication Instructions:    Your physician recommends that you continue on your current medications as directed. Please refer to the Current Medication list given to you today.  Labwork:  NONE  Testing/Procedures:  NONE  Follow-Up:  Your physician recommends that you schedule a follow-up appointment in: 3 months with Dr. Bronson Ing.  Any Other Special Instructions Will Be Listed Below (If Applicable).  Your physician has requested that you regularly monitor and record your blood pressure readings dialy at home and dialysis. Please use the same machine/cuff at the same time of day to check your readings and record them to bring to your follow-up visit. If your systolic (top number) blood pressure is consistently greater than 170, call our office.   If you need a refill on your cardiac medications before your next appointment, please call your pharmacy.    Signed, Daune Perch, NP  01/30/2019 5:38 PM    [facility]

## 2019-01-30 ENCOUNTER — Other Ambulatory Visit: Payer: Self-pay

## 2019-01-30 ENCOUNTER — Ambulatory Visit (INDEPENDENT_AMBULATORY_CARE_PROVIDER_SITE_OTHER): Payer: Medicare HMO | Admitting: Cardiology

## 2019-01-30 ENCOUNTER — Encounter: Payer: Self-pay | Admitting: Cardiology

## 2019-01-30 VITALS — BP 170/64 | HR 64 | Temp 99.4°F | Ht 61.0 in | Wt 127.0 lb

## 2019-01-30 DIAGNOSIS — E782 Mixed hyperlipidemia: Secondary | ICD-10-CM

## 2019-01-30 DIAGNOSIS — I480 Paroxysmal atrial fibrillation: Secondary | ICD-10-CM

## 2019-01-30 DIAGNOSIS — I10 Essential (primary) hypertension: Secondary | ICD-10-CM | POA: Diagnosis not present

## 2019-01-30 DIAGNOSIS — I5032 Chronic diastolic (congestive) heart failure: Secondary | ICD-10-CM

## 2019-01-30 DIAGNOSIS — I25118 Atherosclerotic heart disease of native coronary artery with other forms of angina pectoris: Secondary | ICD-10-CM

## 2019-01-30 DIAGNOSIS — Z992 Dependence on renal dialysis: Secondary | ICD-10-CM

## 2019-01-30 DIAGNOSIS — N186 End stage renal disease: Secondary | ICD-10-CM

## 2019-01-30 NOTE — Patient Instructions (Addendum)
Medication Instructions:    Your physician recommends that you continue on your current medications as directed. Please refer to the Current Medication list given to you today.  Labwork:  NONE  Testing/Procedures:  NONE  Follow-Up:  Your physician recommends that you schedule a follow-up appointment in: 3 months with Dr. Bronson Ing.  Any Other Special Instructions Will Be Listed Below (If Applicable).  Your physician has requested that you regularly monitor and record your blood pressure readings dialy at home and dialysis. Please use the same machine/cuff at the same time of day to check your readings and record them to bring to your follow-up visit. If your systolic (top number) blood pressure is consistently greater than 170, call our office.   If you need a refill on your cardiac medications before your next appointment, please call your pharmacy.

## 2019-01-31 ENCOUNTER — Other Ambulatory Visit (INDEPENDENT_AMBULATORY_CARE_PROVIDER_SITE_OTHER): Payer: Self-pay | Admitting: Internal Medicine

## 2019-02-01 ENCOUNTER — Other Ambulatory Visit: Payer: Self-pay | Admitting: Cardiovascular Disease

## 2019-02-01 ENCOUNTER — Encounter: Payer: Self-pay | Admitting: *Deleted

## 2019-02-01 ENCOUNTER — Other Ambulatory Visit: Payer: Self-pay | Admitting: *Deleted

## 2019-02-01 MED ORDER — CLONIDINE HCL 0.1 MG PO TABS: 0.1000 mg | ORAL_TABLET | Freq: Two times a day (BID) | ORAL | 2 refills | Status: DC

## 2019-02-01 NOTE — Telephone Encounter (Signed)
Blood pressure readings requested by letter from [facility]. 2nd request.

## 2019-02-01 NOTE — Telephone Encounter (Signed)
Patient called checking on status of getting refill

## 2019-02-01 NOTE — Telephone Encounter (Signed)
Contacted patient back in regards to BP readings.  Stated last night BP was 214/ ?  & rechecked 190/ ?  - stated that she only wrote down the top number.

## 2019-02-01 NOTE — Telephone Encounter (Signed)
Patient is asking for refill on her Clonidine.  Did not see this on her medication list, but says she was told that she needed to continue to take this at visit on 01/30/19.  Please advise.

## 2019-02-01 NOTE — Telephone Encounter (Signed)
Discussed with Daune Perch, NP - okay to give refill on the Clonidine 0.1mg  twice a day for now, but advises she discuss further with Dr. Lowanda Foster as well.  Patient notified & verbalized understanding.  Rx sent to [facility] at patient request.

## 2019-02-01 NOTE — Telephone Encounter (Signed)
Patient informed that BP readings have been requested from [place] and once her BP readings from dialysis are reviewed that we would contact her back about clonidine. Verbalized understanding.

## 2019-02-05 ENCOUNTER — Emergency Department (HOSPITAL_COMMUNITY)
Admission: EM | Admit: 2019-02-05 | Discharge: 2019-02-05 | Disposition: A | Discharge status: Home / Self Care | Payer: Medicare HMO | Attending: Emergency Medicine | Admitting: Emergency Medicine

## 2019-02-05 ENCOUNTER — Emergency Department (HOSPITAL_COMMUNITY): Payer: Medicare HMO

## 2019-02-05 ENCOUNTER — Other Ambulatory Visit: Payer: Self-pay

## 2019-02-05 ENCOUNTER — Encounter (HOSPITAL_COMMUNITY): Payer: Self-pay | Admitting: *Deleted

## 2019-02-05 DIAGNOSIS — I132 Hypertensive heart and chronic kidney disease with heart failure and with stage 5 chronic kidney disease, or end stage renal disease: Secondary | ICD-10-CM | POA: Diagnosis not present

## 2019-02-05 DIAGNOSIS — Z992 Dependence on renal dialysis: Secondary | ICD-10-CM | POA: Diagnosis not present

## 2019-02-05 DIAGNOSIS — Z85038 Personal history of other malignant neoplasm of large intestine: Secondary | ICD-10-CM | POA: Insufficient documentation

## 2019-02-05 DIAGNOSIS — Z7902 Long term (current) use of antithrombotics/antiplatelets: Secondary | ICD-10-CM | POA: Diagnosis not present

## 2019-02-05 DIAGNOSIS — I259 Chronic ischemic heart disease, unspecified: Secondary | ICD-10-CM | POA: Diagnosis not present

## 2019-02-05 DIAGNOSIS — Z951 Presence of aortocoronary bypass graft: Secondary | ICD-10-CM | POA: Insufficient documentation

## 2019-02-05 DIAGNOSIS — I5033 Acute on chronic diastolic (congestive) heart failure: Secondary | ICD-10-CM | POA: Insufficient documentation

## 2019-02-05 DIAGNOSIS — R042 Hemoptysis: Secondary | ICD-10-CM | POA: Diagnosis present

## 2019-02-05 DIAGNOSIS — N186 End stage renal disease: Secondary | ICD-10-CM | POA: Diagnosis not present

## 2019-02-05 DIAGNOSIS — Z79899 Other long term (current) drug therapy: Secondary | ICD-10-CM | POA: Insufficient documentation

## 2019-02-05 DIAGNOSIS — Z955 Presence of coronary angioplasty implant and graft: Secondary | ICD-10-CM | POA: Insufficient documentation

## 2019-02-05 IMAGING — CR PORTABLE CHEST - 1 VIEW
1 series · 1 of 1 positions shown · non-contrast
Comparison: [DATE]

CLINICAL DATA: Hemoptysis today.

EXAM:
PORTABLE CHEST 1 VIEW

[portable]
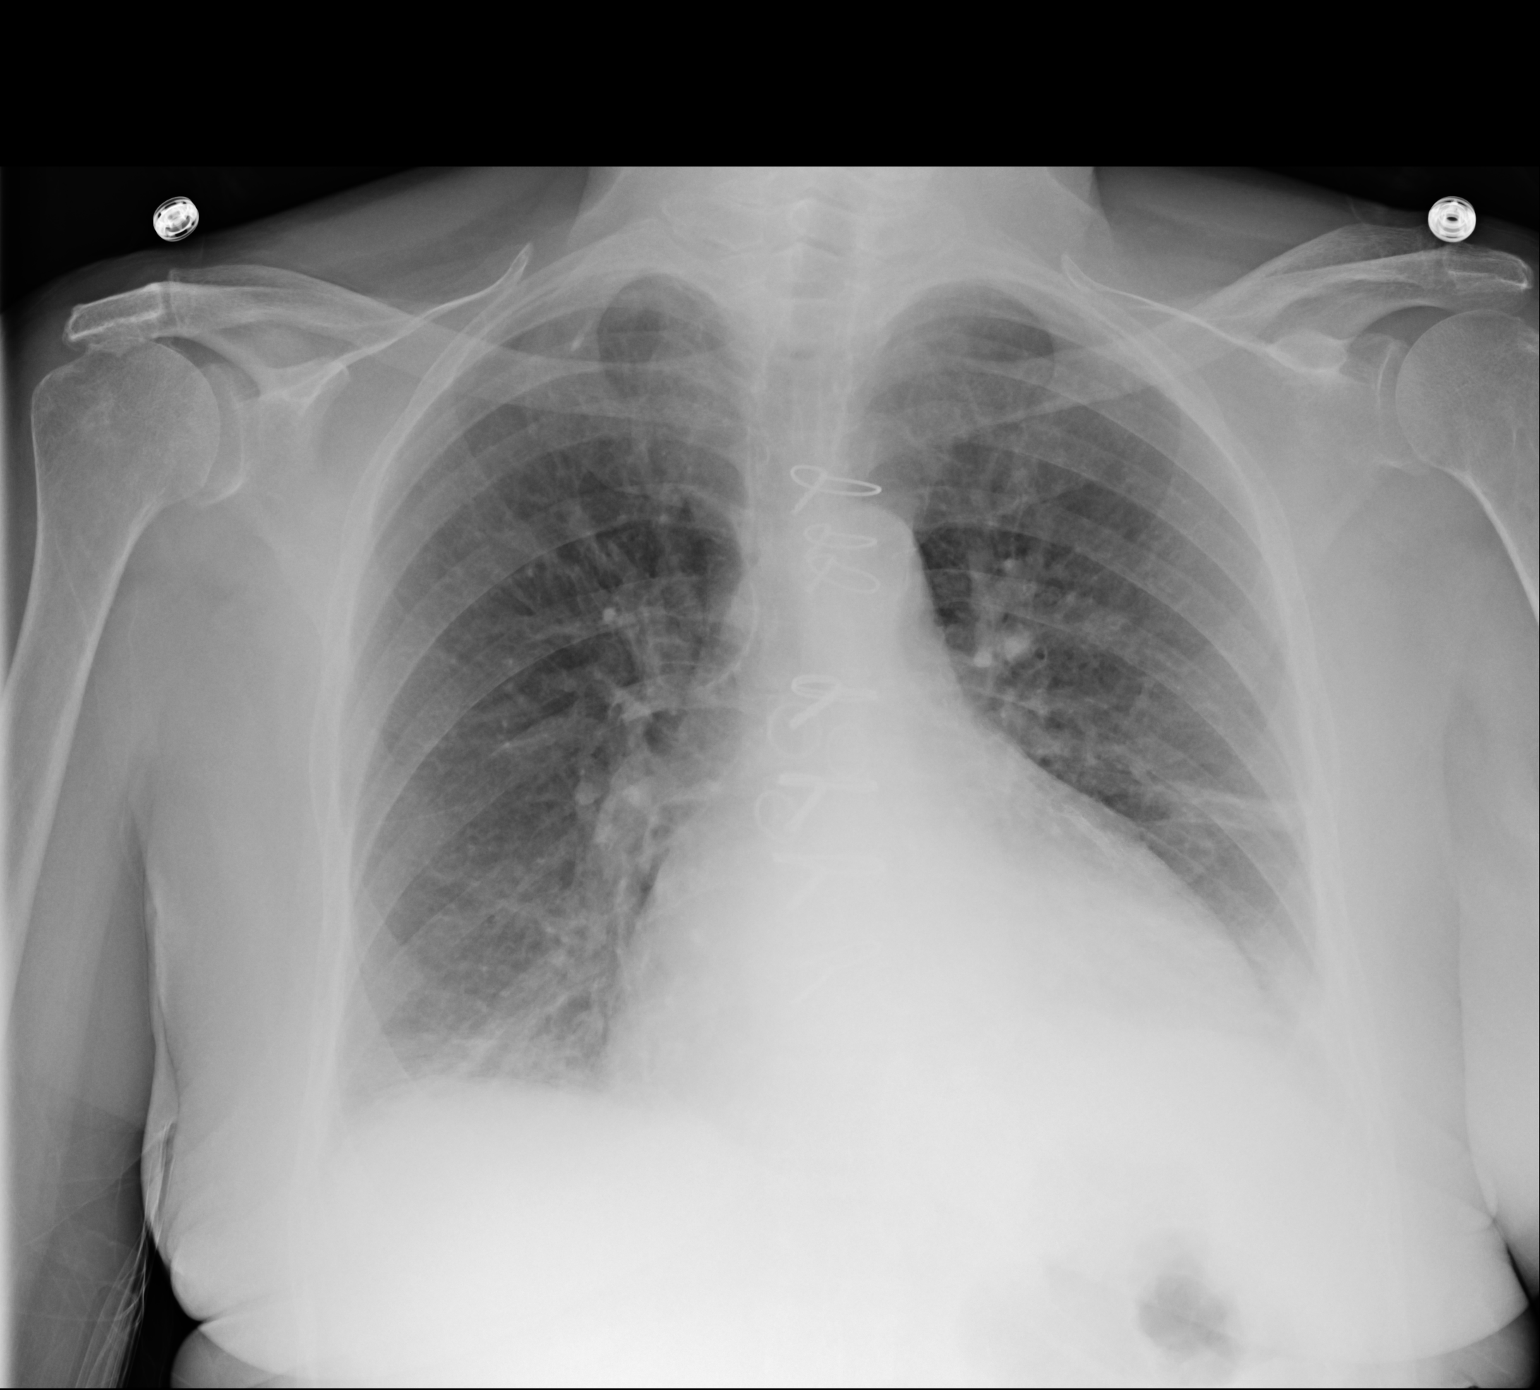

[1 of 1 positions shown; findings below may reference images not displayed]

FINDINGS: Previous median sternotomy and CABG. The heart is enlarged.
Pulmonary venous hypertension, possibly with early interstitial
edema. Mild basilar atelectasis. No acute bone finding.
IMPRESSION: Previous CABG. Suspicion of low level congestive heart failure. Mild
basilar atelectasis.

## 2019-02-05 MED ORDER — GUAIFENESIN-CODEINE 100-10 MG/5ML PO SYRP: 5.0000 mL | ORAL_SOLUTION | Freq: Three times a day (TID) | ORAL | 0 refills | Status: DC | PRN

## 2019-02-05 NOTE — ED Triage Notes (Signed)
Pt reports she saw blood in her sputum this morning when she was coughing. Denies SOB.

## 2019-02-07 NOTE — ED Provider Notes (Signed)
[facility] EMERGENCY DEPARTMENT Provider Note   CSN: [id number] Arrival date & time: 02/05/19  1531     History   Chief Complaint Chief Complaint  Patient presents with  . Hemoptysis    HPI Shawna [name] is a 78 y.o. female.     HPI   78 year old female with hemoptysis.  She reports having blood-tinged sputum with coughing earlier today.  It does seem to since resolved.  No acute respiratory complaints.  Has not noted any overt bleeding otherwise.  No fevers or chills.  She is not anticoagulated.  Past Medical History:  Diagnosis Date  . Acute on chronic respiratory failure with hypoxia ([place]) 10/10/2016  . Anxiety   . Arthritis   . AVM (arteriovenous malformation) of colon   . CAD (coronary artery disease)    a. s/p CABG in 2013 b. most recent DES to D1 in 10/2016 with cath showing patent LIMA-LAD, SVG-RI, SVG-Mrg and SVG-RCA  c. NST in 02/2018 showing scar with no significant ischemia  . Carotid artery disease ([place])    a. 94-07% LICA, 12/8086   . Chronic bronchitis ([place])   . Chronic diastolic CHF (congestive heart failure) ([place])    a. 02/2012 Echo EF 60-65%, nl wall motion, Gr 1 DD, mod MR  . Colon cancer ([place]) 1992  . Esophageal stricture   . ESRD on hemodialysis ([place])    ESRD due to HTN, started dialysis 2011 and gets HD at [facility] with Dr Hinda Lenis on MWF schedule.  Access is LUA AVF as of Sept 2014.   Marland Kitchen GERD (gastroesophageal reflux disease)   . High cholesterol 12/2011  . History of blood transfusion 07/2011; 12/2011; 01/2012 X 2; 04/2012  . History of gout   . History of lower GI bleeding   . Hypertension   . Iron deficiency anemia   . Mitral regurgitation    a. Moderate by echo, 02/2012  . Myocardial infarction ([place])   . Ovarian cancer ([place]) 1992  . Pneumonia ~ 2009  . PUD (peptic ulcer disease)   . TIA (transient ischemic attack)     Patient Active Problem List   Diagnosis Date Noted  . Acute respiratory failure with hypoxia ([place]) 12/25/2018  .  Elevated troponin 12/14/2018  . PAF (paroxysmal atrial fibrillation) ([place]) 08/06/2018  . Chest pain at rest 07/13/2018  . Hand steal syndrome ([place]) 08/01/2017  . Anemia 07/14/2017  . Coronary artery disease of bypass graft of native heart with stable angina pectoris ([place]) 06/05/2017  . Mesenteric ischemia ([place])   . Diverticulitis   . SBO (small bowel obstruction) ([place]) 03/22/2017  . Enteritis   . Complication of vascular access for dialysis 03/19/2017  . Preoperative clearance 01/25/2017  . Symptomatic anemia 10/24/2016  . H/O non-ST elevation myocardial infarction (NSTEMI) 10/24/2016  . Fluid overload 10/10/2016  . Complication from renal dialysis device 10/10/2016  . Non-ST elevation MI (NSTEMI) ([place])   . Encounter for fitting and adjustment of vascular catheter   . End stage renal disease ([place])   . ESRD (end stage renal disease) ([place])   . Heme positive stool   . Demand ischemia ([place]) 07/27/2016  . Hypertensive emergency 07/08/2016  . Acute on chronic respiratory failure with hypoxia ([place])   . Cardiac arrest ([place])   . Palliative care encounter   . Goals of care, counseling/discussion   . Hypertensive crisis without congestive heart failure 05/09/2016  . Acute pulmonary edema ([place]) 04/06/2016  . Acute respiratory failure ([place]) 04/06/2016  .  Hypertensive crisis 01/27/2016  . History of colon cancer 01/27/2016  . History of ovarian cancer 01/27/2016  . Hypertensive urgency 01/27/2016  . Narrow complex tachycardia ([place]) 09/08/2015  . SVT (supraventricular tachycardia) ([place]) 09/08/2015  . Influenza A 08/30/2015  . Acute on chronic diastolic CHF (congestive heart failure) ([place]) 05/04/2015  . Unstable angina ([place]) 05/03/2015  . Essential hypertension   . Pain in joint, lower leg 08/14/2014  . Small bowel obstruction, partial ([place]) 05/29/2013  . Chronic diastolic CHF (congestive heart failure) ([place]) 03/22/2013  . GI bleeding 03/21/2013  . Acute blood loss anemia 03/21/2013  .  Vaginal odor 03/12/2013  . Vaginal discharge 03/12/2013  . Occlusion and stenosis of carotid artery without mention of cerebral infarction 01/24/2013  . Hx of CABG 07/05/2012  . Carotid artery disease ([place]) 07/05/2012  . Anemia of chronic kidney failure 07/03/2012  . Secondary hyperparathyroidism ([place]) 07/03/2012  . Mitral regurgitation 06/12/2012  . Pneumonia 06/09/2012  . Non-STEMI (non-ST elevated myocardial infarction) ([place]) 06/08/2012  . Ischemic chest pain ([place]) 03/01/2012  . 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 GERD (gastroesophageal reflux disease) 01/09/2012  . HLD (hyperlipidemia) 01/05/2012  . Atherosclerosis of native coronary artery of native heart without angina pectoris 12/16/2011  . ESRD on hemodialysis ([place]) 12/16/2011    Past Surgical History:  Procedure Laterality Date  . ABDOMINAL HYSTERECTOMY  1992  . APPENDECTOMY  06/1990  . AV FISTULA PLACEMENT  07/2009   left upper arm  . AV FISTULA PLACEMENT Right 09/06/2016   Procedure: RIGHT FOREARM ARTERIOVENOUS (AV) GRAFT;  Surgeon: Elam Dutch, MD;  Location: [facility] OR;  Service: Vascular;  Laterality: Right;  . AV FISTULA PLACEMENT N/A 02/24/2017   Procedure: INSERTION OF ARTERIOVENOUS (AV) GORE-TEX GRAFT ARM (BRACHIAL AXILLARY);  Surgeon: Katha Cabal, MD;  Location: [facility] ORS;  Service: Vascular;  Laterality: N/A;  . [place] Right 09/06/2016   Procedure: REMOVAL OF Right Arm ARTERIOVENOUS GORETEX GRAFT and Vein Patch angioplasty of brachial artery;  Surgeon: Angelia Mould, MD;  Location: [place];  Service: Vascular;  Laterality: Right;  . COLON RESECTION  1992  . COLON SURGERY    . CORONARY ANGIOPLASTY WITH STENT PLACEMENT  12/15/11   "2"  . CORONARY ANGIOPLASTY WITH STENT PLACEMENT  y/2013   "1; makes total of 3" (05/02/2012)  . CORONARY ARTERY BYPASS GRAFT  06/13/2012   Procedure: CORONARY ARTERY BYPASS GRAFTING (CABG);  Surgeon: Grace Isaac, MD;  Location: [place];  Service: Open Heart Surgery;  Laterality: N/A;  cabg x four;  using left internal mammary artery, and left leg greater saphenous vein harvested endoscopically  . CORONARY STENT INTERVENTION N/A 10/13/2016   Procedure: Coronary Stent Intervention;  Surgeon: Troy Sine, MD;  Location: [place] CV LAB;  Service: Cardiovascular;  Laterality: N/A;  . DIALYSIS/PERMA CATHETER REMOVAL N/A 04/18/2017   Procedure: DIALYSIS/PERMA CATHETER REMOVAL;  Surgeon: Katha Cabal, MD;  Location: [place] CV LAB;  Service: Cardiovascular;  Laterality: N/A;  . DILATION AND CURETTAGE OF UTERUS    . ESOPHAGOGASTRODUODENOSCOPY  01/20/2012   Procedure: ESOPHAGOGASTRODUODENOSCOPY (EGD);  Surgeon: Ladene Artist, MD,FACG;  Location: [facility] ENDOSCOPY;  Service: Endoscopy;  Laterality: N/A;  . ESOPHAGOGASTRODUODENOSCOPY N/A 03/26/2013   Procedure: ESOPHAGOGASTRODUODENOSCOPY (EGD);  Surgeon: Irene Shipper, MD;  Location: [facility] ENDOSCOPY;  Service: Endoscopy;  Laterality: N/A;  . ESOPHAGOGASTRODUODENOSCOPY N/A 04/30/2015   Procedure: ESOPHAGOGASTRODUODENOSCOPY (EGD);  Surgeon: Rogene Houston, MD;  [facility];  Service:  Endoscopy;  Laterality: N/A;  1pm - moved to 10/20 @ 1:10  . ESOPHAGOGASTRODUODENOSCOPY N/A 07/29/2016   Procedure: ESOPHAGOGASTRODUODENOSCOPY (EGD);  Surgeon: Manus Gunning, MD;  Location: [place];  Service: Gastroenterology;  Laterality: N/A;  enteroscopy  . INTRAOPERATIVE TRANSESOPHAGEAL ECHOCARDIOGRAM  06/13/2012   Procedure: INTRAOPERATIVE TRANSESOPHAGEAL ECHOCARDIOGRAM;  Surgeon: Grace Isaac, MD;  Location: [place];  Service: Open Heart Surgery;  Laterality: N/A;  . IR GENERIC HISTORICAL  07/26/2016   IR FLUORO GUIDE CV LINE RIGHT 07/26/2016 Greggory Keen, MD [facility]-INTER[facility] RAD  . IR GENERIC HISTORICAL  07/26/2016   IR US GUIDE VASC ACCESS RIGHT 07/26/2016 Greggory Keen, MD [facility]  . IR GENERIC HISTORICAL  08/02/2016   IR US GUIDE VASC ACCESS RIGHT 08/02/2016 Greggory Keen, MD [facility]  . IR GENERIC HISTORICAL  08/02/2016   IR FLUORO GUIDE CV LINE RIGHT 08/02/2016 Greggory Keen, MD [facility]  . IR RADIOLOGY PERIPHERAL GUIDED IV START  03/28/2017  . IR US GUIDE VASC ACCESS RIGHT  03/28/2017  . LEFT HEART CATH AND CORONARY ANGIOGRAPHY N/A 09/20/2016   Procedure: Left Heart Cath and Coronary Angiography;  Surgeon: [name], MD;  Location: [place] CV LAB;  Service: Cardiovascular;  Laterality: N/A;  . LEFT HEART CATH AND CORS/GRAFTS ANGIOGRAPHY N/A 10/13/2016   Procedure: Left Heart Cath and Cors/Grafts Angiography;  Surgeon: Troy Sine, MD;  Location: [place] CV LAB;  Service: Cardiovascular;  Laterality: N/A;  . LEFT HEART CATH AND CORS/GRAFTS ANGIOGRAPHY N/A 07/13/2018   Procedure: LEFT HEART CATH AND CORS/GRAFTS ANGIOGRAPHY;  Surgeon: Martinique, Peter M, MD;  Location: [place] CV LAB;  Service: Cardiovascular;  Laterality: N/A;  . LEFT HEART CATHETERIZATION WITH CORONARY ANGIOGRAM N/A 12/15/2011   Procedure: LEFT HEART CATHETERIZATION WITH CORONARY ANGIOGRAM;  Surgeon: Burnell Blanks, MD;  Location[facility] CATH LAB;  Service: Cardiovascular;  Laterality: N/A;  . LEFT HEART CATHETERIZATION WITH CORONARY ANGIOGRAM N/A 01/10/2012   Procedure: LEFT HEART CATHETERIZATION WITH CORONARY ANGIOGRAM;  Surgeon: Peter M Martinique, MD;  Location: [facility] CATH LAB;  Service: Cardiovascular;  Laterality: N/A;  . LEFT HEART CATHETERIZATION WITH CORONARY ANGIOGRAM N/A 06/08/2012   Procedure: LEFT HEART CATHETERIZATION WITH CORONARY ANGIOGRAM;  Surgeon: Burnell Blanks, MD;  Location: [facility] CATH LAB;  Service: Cardiovascular;  Laterality: N/A;  . LEFT HEART CATHETERIZATION WITH CORONARY/GRAFT ANGIOGRAM N/A 12/10/2013   Procedure: LEFT HEART CATHETERIZATION WITH Beatrix Fetters;  Surgeon: [name], MD;  Location: [facility] CATH LAB;  Service: Cardiovascular;  Laterality: N/A;  . OVARY SURGERY     ovarian cancer  . REVISION OF ARTERIOVENOUS GORETEX  GRAFT N/A 02/24/2017   Procedure: REVISION OF ARTERIOVENOUS GORETEX GRAFT (RESECTION);  Surgeon: Katha Cabal, MD;  Location: [facility] ORS;  Service: Vascular;  Laterality: N/A;  Earney Mallet N/A 10/15/2013   Procedure: Fistulogram;  Surgeon: Serafina Mitchell, MD;  Location: [facility] CATH LAB;  Service: Cardiovascular;  Laterality: N/A;  . THROMBECTOMY / ARTERIOVENOUS GRAFT REVISION  2011   left upper arm  . TUBAL LIGATION  1980's  . UPPER EXTREMITY ANGIOGRAPHY Bilateral 12/06/2016   Procedure: Upper Extremity Angiography;  Surgeon: Katha Cabal, MD;  Location: [place] CV LAB;  Service: Cardiovascular;  Laterality: Bilateral;  . UPPER EXTREMITY INTERVENTION Left 06/06/2017   Procedure: UPPER EXTREMITY INTERVENTION;  Surgeon: Katha Cabal, MD;  Location: [place] CV LAB;  Service: Cardiovascular;  Laterality: Left;     OB History    Gravida  2   Para  2   Term      Preterm  2   AB      Living  2     SAB      TAB      Ectopic      Multiple      Live Births               Home Medications    Prior to Admission medications   Medication Sig Start Date End Date Taking? Authorizing Provider  ALPRAZolam Duanne Moron) 0.5 MG tablet Take 0.5 mg by mouth daily. 10/16/18   [provider]  amiodarone (PACERONE) 200 MG tablet Take 1 tablet (200 mg total) by mouth daily. 07/14/18   Hosie Poisson, MD  carvedilol (COREG) 12.5 MG tablet Take 1 tablet (12.5 mg total) by mouth 2 (two) times daily with a meal for 30 days. Additional refills per PCP 12/27/18 01/26/19  Donne Hazel, MD  cloNIDine (CATAPRES) 0.1 MG tablet Take 1 tablet (0.1 mg total) by mouth 2 (two) times daily. 02/01/19   Daune Perch, NP  diphenhydrAMINE (BENADRYL) 25 mg capsule Take 50 mg by mouth daily as needed for itching or allergies.     [provider]  fluticasone (FLONASE) 50 MCG/ACT nasal spray Place 1 spray at bedtime as needed into both nostrils for allergies.     [provider]  guaiFENesin-codeine (ROBITUSSIN AC) 100-10 MG/5ML syrup Take 5 mLs by mouth 3 (three) times daily as needed for cough. 02/05/19   Virgel Manifold, MD  isosorbide mononitrate (IMDUR) 60 MG 24 hr tablet Take 1 tablet (60 mg total) by mouth 2 (two) times a day for 30 days. 12/15/18 01/14/19  Heath Lark D, DO  lidocaine-prilocaine (EMLA) cream Apply 1 application topically every Monday, Wednesday, and Friday. Prior to dialysis    [provider]  loratadine (CLARITIN) 10 MG tablet Take 1 tablet (10 mg total) by mouth daily as needed for allergies. 07/14/18   Hosie Poisson, MD  multivitamin (RENA-VIT) TABS tablet Take 1 tablet by mouth daily.    [provider]  nitroGLYCERIN (NITROSTAT) 0.4 MG SL tablet DISSOLVE ONE TABLET UNDER THE TONGUE EVERY 5 MINUTES AS NEEDED FOR CHEST PAIN.  DO NOT EXCEED A TOTAL OF 3 DOSES IN 15 MINUTES Patient taking differently: Place 0.4 mg under the tongue every 5 (five) minutes as needed.  10/08/18   Herminio Commons, MD  omeprazole (PRILOSEC) 20 MG capsule Take 1 capsule by mouth once daily 01/31/19   Rogene Houston, MD  sevelamer carbonate (RENVELA) 800 MG tablet Take 800-2,400 mg by mouth See admin instructions. Take 3 tablets (2400) by mouth twice daily with meals, take 1 tablet (800 mg) with snacks    [provider]  simvastatin (ZOCOR) 20 MG tablet TAKE ONE TABLET (20 MG) BY MOUTH DAILY AT BEDTIME Patient taking differently: Take 20 mg by mouth daily at 6 PM. TAKE ONE TABLET (20 MG) BY MOUTH DAILY AT BEDTIME 04/03/18   Herminio Commons, MD  ticagrelor (BRILINTA) 60 MG TABS tablet Take 1 tablet (60 mg total) by mouth 2 (two) times daily. 08/17/18   Almyra Deforest, PA    Family History Family History  Problem Relation Age of Onset  . Heart disease Mother        Heart Disease before age 40  . Hyperlipidemia Mother   . Hypertension Mother   . Diabetes Mother   . Heart attack Mother   . Heart disease Father  Heart Disease  before age 81  . Hyperlipidemia Father   . Hypertension Father   . Diabetes Father   . Diabetes Sister   . Hypertension Sister   . Diabetes Brother   . Hyperlipidemia Brother   . Heart attack Brother   . Hypertension Sister   . Heart attack Brother   . Other Other        noncontributory for early CAD  . Colon cancer Neg Hx   . Esophageal cancer Neg Hx   . Liver disease Neg Hx   . Kidney disease Neg Hx   . Colon polyps Neg Hx     Social History Social History   Tobacco Use  . Smoking status: Never Smoker  . Smokeless tobacco: Never Used  Substance Use Topics  . Alcohol use: No    Alcohol/week: 0.0 standard drinks  . Drug use: No     Allergies   Aspirin, Penicillins, Amlodipine, Bactrim [sulfamethoxazole-trimethoprim], Contrast media [iodinated diagnostic agents], Iron, Nitrofurantoin, Tylenol [acetaminophen], Gabapentin, Hydralazine, Dexilant [dexlansoprazole], Levaquin [levofloxacin in d5w], Morphine and related, Plavix [clopidogrel bisulfate], Protonix [pantoprazole sodium], and Venofer [ferric oxide]   Review of Systems Review of Systems  All systems reviewed and negative, other than as noted in HPI.  Physical Exam Updated Vital Signs BP (!) 179/53   Pulse (!) 54   Temp 98.3 F (36.8 C) (Oral)   Resp 16   Ht 5\' 1"  (1.549 m)   Wt 57.6 kg   SpO2 100%   BMI 24.00 kg/m   Physical Exam Vitals signs and nursing note reviewed.  Constitutional:      General: She is not in acute distress.    Appearance: She is well-developed.  HENT:     Head: Normocephalic and atraumatic.  Eyes:     General:        Right eye: No discharge.        Left eye: No discharge.     Conjunctiva/sclera: Conjunctivae normal.  Neck:     Musculoskeletal: Neck supple.  Cardiovascular:     Rate and Rhythm: Normal rate and regular rhythm.     Heart sounds: Normal heart sounds. No murmur. No friction rub. No gallop.   Pulmonary:     Effort: Pulmonary effort is normal. No respiratory  distress.     Breath sounds: Normal breath sounds.  Abdominal:     General: There is no distension.     Palpations: Abdomen is soft.     Tenderness: There is no abdominal tenderness.  Musculoskeletal:        General: No tenderness.  Skin:    General: Skin is warm and dry.  Neurological:     Mental Status: She is alert.  Psychiatric:        Behavior: Behavior normal.        Thought Content: Thought content normal.      ED Treatments / Results  Labs (all labs ordered are listed, but only abnormal results are displayed) Labs Reviewed - No data to display  EKG None  Radiology Dg Chest [facility] 1 View  Result Date: 02/05/2019 CLINICAL DATA:  Hemoptysis today. EXAM: PORTABLE CHEST 1 VIEW COMPARISON:  12/25/2018 FINDINGS: Previous median sternotomy and CABG. The heart is enlarged. Pulmonary venous hypertension, possibly with early interstitial edema. Mild basilar atelectasis. No acute bone finding. IMPRESSION: Previous CABG. Suspicion of low level congestive heart failure. Mild basilar atelectasis. Electronically Signed   By: Nelson Chimes M.D.   On: 02/05/2019 21:36  Procedures Procedures (including critical care time)  Medications Ordered in ED Medications - No data to display   Initial Impression / Assessment and Plan / ED Course  I have reviewed the triage vital signs and the nursing notes.  Pertinent labs & imaging results that were available during my care of the patient were reviewed by me and considered in my medical decision making (see chart for details).        78 year old female with what sounds like a very minimal hemoptysis.  Small amount of blood-tinged sputum this morning which has since resolved.  I suspect this may simply be from some airway irritation.  She reports a lot of coughing recently.  She is afebrile.  She appears very well.  She has no acute respiratory complaints otherwise.  Chest x-ray is clear.  No epistaxis or intraoral source noted.  Plan  symptomatic treatment of her cough.  I have a low suspicion for PE, malignancy, heart failure or other potential emergent process as the cause of her symptoms at this time.  Return precautions were discussed.  Follow-up with her PCP otherwise.  Final Clinical Impressions(s) / ED Diagnoses   Final diagnoses:  Hemoptysis    ED Discharge Orders         Ordered    guaiFENesin-codeine [facility] AC) 100-10 MG/5ML syrup  3 times daily PRN     02/05/19 2331           Virgel Manifold, MD 02/07/19 1042

## 2019-02-14 ENCOUNTER — Ambulatory Visit (HOSPITAL_COMMUNITY)
Admission: RE | Admit: 2019-02-14 | Discharge: 2019-02-14 | Disposition: A | Discharge status: Home / Self Care | Payer: Medicare HMO | Source: Ambulatory Visit | Attending: Urology | Admitting: Urology

## 2019-02-14 ENCOUNTER — Other Ambulatory Visit: Payer: Self-pay

## 2019-02-14 DIAGNOSIS — D3001 Benign neoplasm of right kidney: Secondary | ICD-10-CM | POA: Insufficient documentation

## 2019-02-14 IMAGING — CT CT ABDOMEN AND PELVIS WITHOUT CONTRAST
2 of 3 series · 15 of 46 positions shown, 17 images · non-contrast
Comparison: CT the abdomen and pelvis [DATE].

CLINICAL DATA: 77-year-old female with history of indeterminate
lesion in the upper pole of the right kidney. Follow-up study.

EXAM:
CT ABDOMEN AND PELVIS WITHOUT CONTRAST
TECHNIQUE: Multidetector CT imaging of the abdomen and pelvis was performed
following the standard protocol without IV contrast.

[Series 2: axial st · axial · 0.61mm/px · z∈[+961,+1296]mm · 12 of 77 slices shown, 14 images]
[im 5/77  soft-tissue]
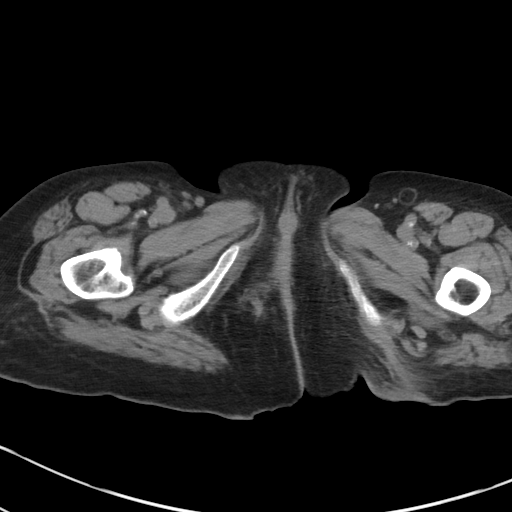
[im 5/77  bone]
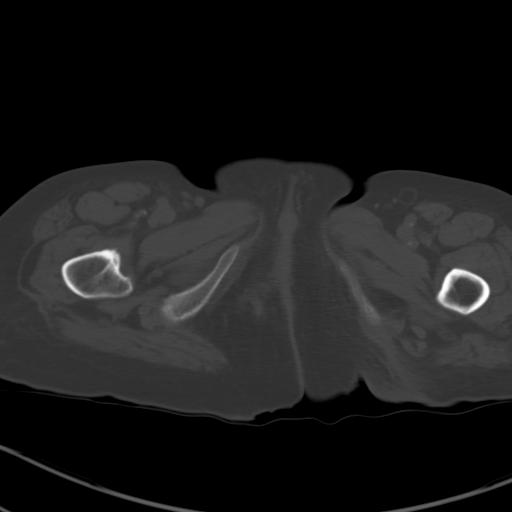
[im 10/77  soft-tissue]
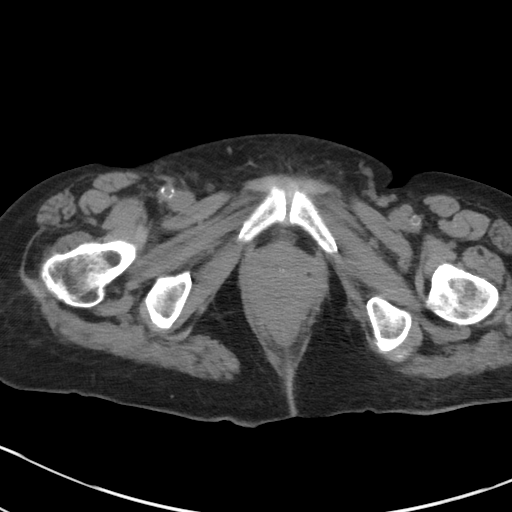
[im 18/77  soft-tissue]
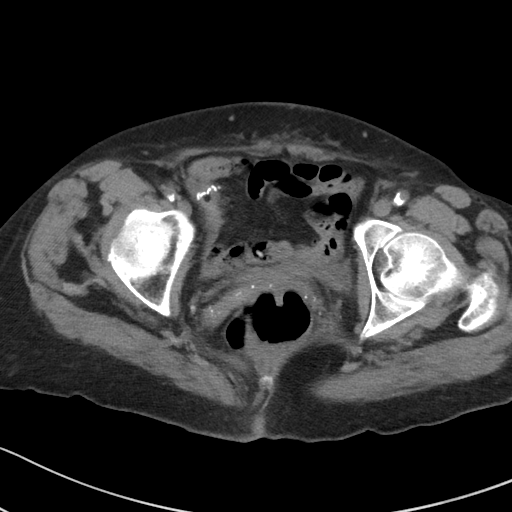
[im 23/77  soft-tissue]
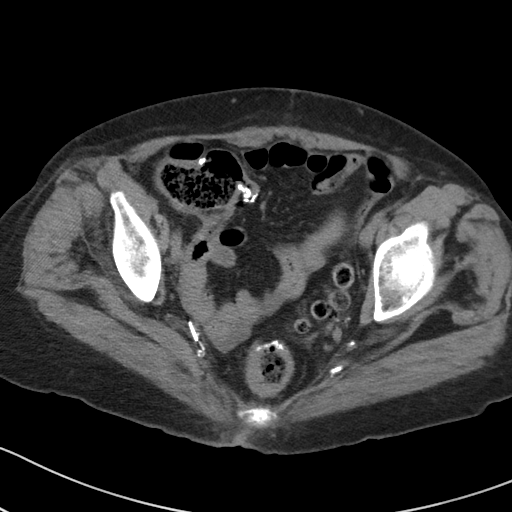
[im 30/77  soft-tissue]
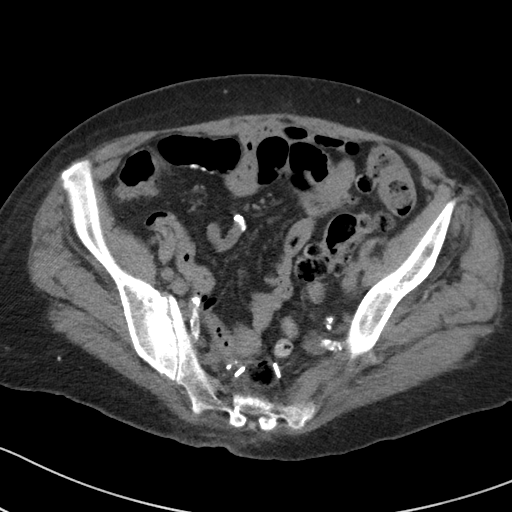
[im 35/77  soft-tissue]
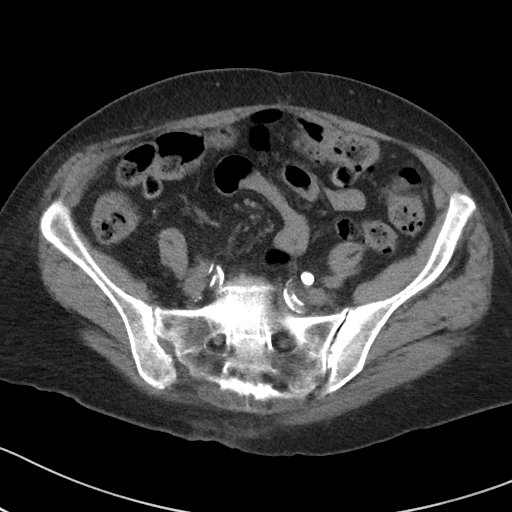
[im 42/77  soft-tissue]
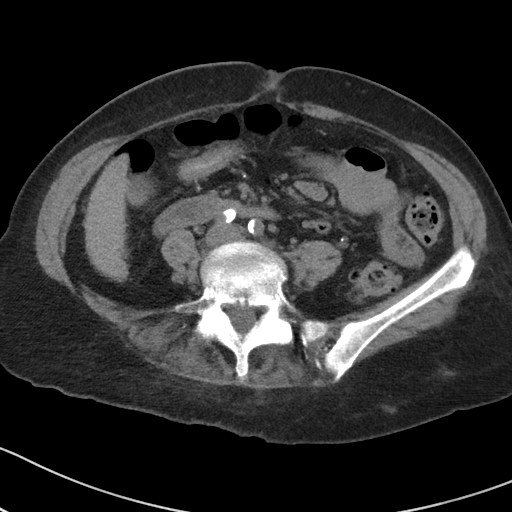
[im 47/77  soft-tissue]
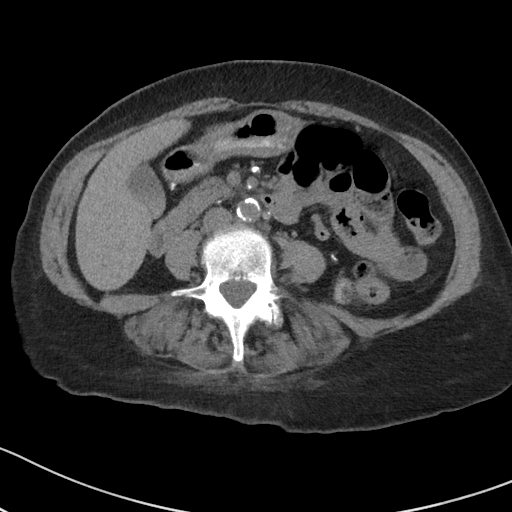
[im 54/77  soft-tissue]
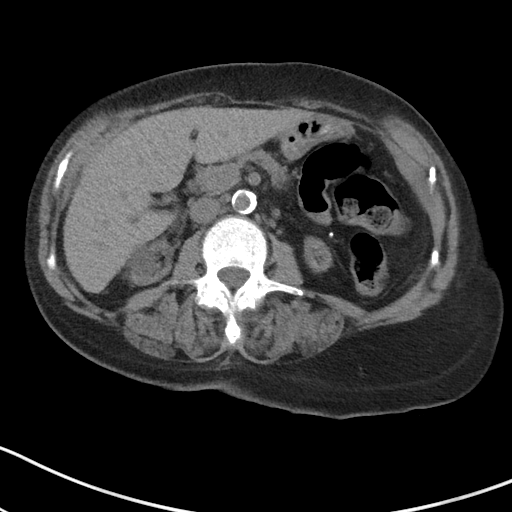
[im 54/77  bone]
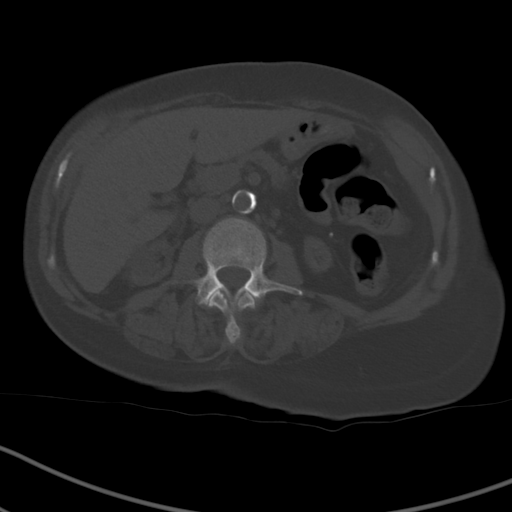
[im 59/77  soft-tissue]
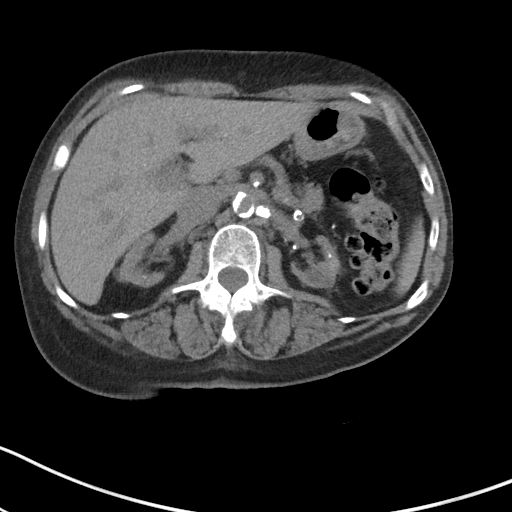
[im 67/77  soft-tissue]
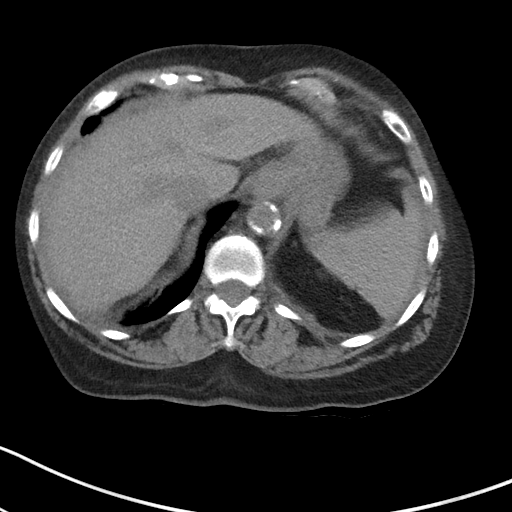
[im 72/77  soft-tissue]
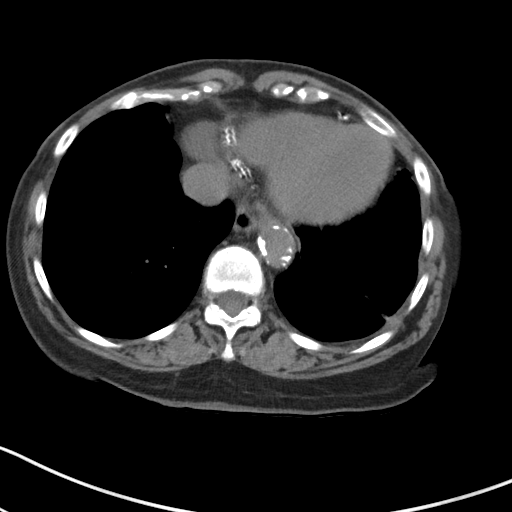

[Series 5: coronal st · coronal · 0.64mm/px · 3 of 71 slices shown]
[im 24/71  soft-tissue]
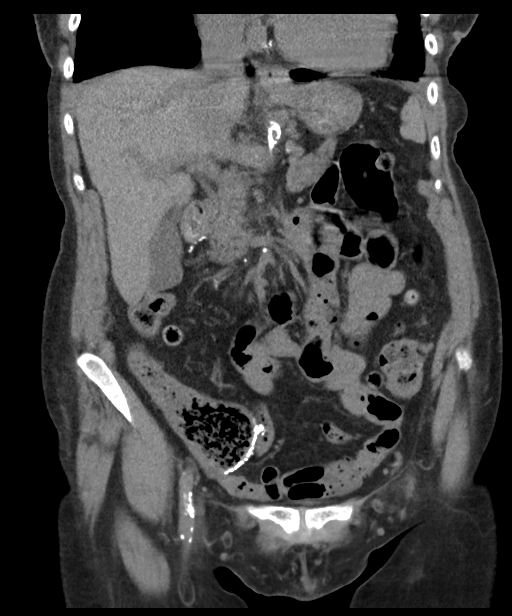
[im 32/71  soft-tissue]
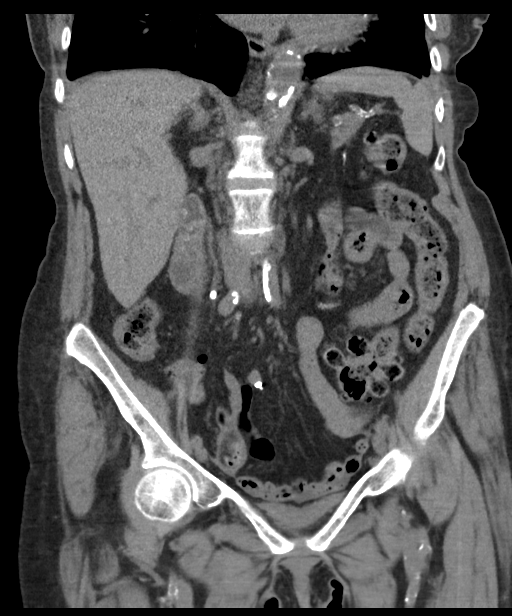
[im 39/71  soft-tissue]
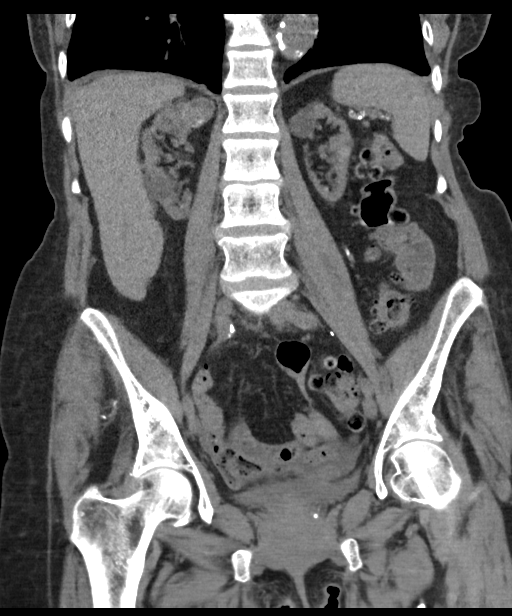

[15 of 46 positions shown; findings below may reference images not displayed]

FINDINGS: Lower chest: Scattered areas of mild cylindrical bronchiectasis and
scarring in the lower lungs bilaterally. Atherosclerotic
calcifications in the descending thoracic aorta as well as the left
anterior descending, left circumflex and right coronary arteries.

Hepatobiliary: No definite cystic or solid hepatic lesions are
confidently identified on today's noncontrast CT examination.
Amorphous intermediate attenuation lying dependently in the
gallbladder likely to reflect biliary sludge. No findings to suggest
an acute cholecystitis at this time.

Pancreas: No definite pancreatic mass or peripancreatic fluid
collections or inflammatory changes are noted on today's noncontrast
CT examination.

Spleen: Unremarkable.

Adrenals/Urinary Tract: Atrophy of both kidneys. Multiple lesions in
both kidneys, most of which are low-attenuation, none of which are
characterized on today's noncontrast CT examination. There is again
an exophytic lesion in the upper pole of the right kidney which
currently measures 3.2 x 2.3 cm and is intermediate attenuation (30
HU), similar to the prior examinations. Multiple calcifications
associated with renal hila bilaterally appear to be vascular.

Stomach/Bowel: Normal appearance of the stomach. No pathologic
dilatation of small bowel or colon. Suture line near the
rectosigmoid junction suggesting prior partial colectomy. Numerous
colonic diverticulae are noted, particularly in the descending colon
and sigmoid colon, without surrounding inflammatory changes to
suggest an acute diverticulitis at this time. The appendix is not
confidently identified and may be surgically absent. Regardless,
there are no inflammatory changes noted adjacent to the cecum to
suggest the presence of an acute appendicitis at this time.

Vascular/Lymphatic: Aortic atherosclerosis. No lymphadenopathy noted
in the abdomen or pelvis.

Reproductive: Status post hysterectomy. Ovaries are not confidently
identified may be surgically absent or atrophic.

Other: No significant volume of ascites.  No pneumoperitoneum.

Musculoskeletal: There are no aggressive appearing lytic or blastic
lesions noted in the visualized portions of the skeleton.
IMPRESSION: 1. Indeterminate lesion in the upper pole of the right kidney is
stable compared to the prior study and favored to be benign, or a
slow growing low-grade renal neoplasm.
2. Aortic atherosclerosis, in addition to at least 3 vessel coronary
artery disease. Assessment for potential risk factor modification,
dietary therapy or pharmacologic therapy may be warranted, if
clinically indicated.
3. Severe colonic diverticulosis without evidence of acute
diverticulitis at this time.
4. Aortic atherosclerosis.
5. Additional incidental findings, as above.

## 2019-02-19 ENCOUNTER — Telehealth: Payer: Self-pay | Admitting: Cardiovascular Disease

## 2019-02-19 ENCOUNTER — Other Ambulatory Visit: Payer: Self-pay | Admitting: *Deleted

## 2019-02-19 MED ORDER — AMIODARONE HCL 200 MG PO TABS: 200.0000 mg | ORAL_TABLET | Freq: Every day | ORAL | 1 refills | Status: DC

## 2019-02-19 NOTE — Telephone Encounter (Signed)
Pt called stating she's having swelling in her feet

## 2019-02-19 NOTE — Telephone Encounter (Signed)
Patient c/o feet swelling x 1 week.  States she does not eat a lot of salt & keeps legs propped up.  Patient denies chest pain, dizziness or sob.  No weight gain, poor appetite.  Okay to leave response at home on machine.  Will be at dialysis tomorrow.

## 2019-02-20 NOTE — Telephone Encounter (Signed)
More likely due to venous insufficiency.  Can prescribe knee-high compression stockings.

## 2019-02-20 NOTE — Telephone Encounter (Signed)
Left message for call back on mobile number.

## 2019-02-20 NOTE — Telephone Encounter (Signed)
Patient notified.  Will send prescription to [facility] now.

## 2019-02-28 ENCOUNTER — Ambulatory Visit (INDEPENDENT_AMBULATORY_CARE_PROVIDER_SITE_OTHER): Payer: Self-pay

## 2019-02-28 ENCOUNTER — Telehealth (INDEPENDENT_AMBULATORY_CARE_PROVIDER_SITE_OTHER): Payer: Self-pay | Admitting: Internal Medicine

## 2019-02-28 ENCOUNTER — Other Ambulatory Visit: Payer: Self-pay

## 2019-02-28 NOTE — Telephone Encounter (Signed)
   Diagnosis:    Result(s)   Card 1: Positive:             Completed by: Thomas Hoff, LPN   HEMOCCULT SENSA DEVELOPER: KDC#:[id number] Marguarite Arbour DATE: 2022-06   HEMOCCULT SENSA CARD:  LOT#:  [id number] EXPIRATION DATE: 05/21   CARD CONTROL RESULTS:  POSITIVE: Positive NEGATIVE: Negative    ADDITIONAL COMMENTS: Forwarded to [place] for review. Patient has been given appointment for [place] [facility].

## 2019-02-28 NOTE — Telephone Encounter (Signed)
Please make appointment for the patient with [facility] next week.

## 2019-02-28 NOTE — Telephone Encounter (Signed)
Patient left message stating she is having abd pain and dark stools - would like Dr Laural Golden to call her back at [phone]

## 2019-02-28 NOTE — Telephone Encounter (Signed)
Patient called on the number provided. No answer, continuous ringing.  Patient has left a message earlier that she was having bloating , swollen stomach , dark stools. Last OV was 01/01/19.

## 2019-03-02 ENCOUNTER — Emergency Department (HOSPITAL_COMMUNITY)
Admission: EM | Admit: 2019-03-02 | Discharge: 2019-03-02 | Disposition: A | Discharge status: Home / Self Care | Payer: Medicare HMO | Attending: Emergency Medicine | Admitting: Emergency Medicine

## 2019-03-02 ENCOUNTER — Emergency Department (HOSPITAL_COMMUNITY): Payer: Medicare HMO

## 2019-03-02 ENCOUNTER — Encounter (HOSPITAL_COMMUNITY): Payer: Self-pay

## 2019-03-02 ENCOUNTER — Other Ambulatory Visit: Payer: Self-pay

## 2019-03-02 DIAGNOSIS — K5730 Diverticulosis of large intestine without perforation or abscess without bleeding: Secondary | ICD-10-CM | POA: Diagnosis not present

## 2019-03-02 DIAGNOSIS — I5032 Chronic diastolic (congestive) heart failure: Secondary | ICD-10-CM | POA: Insufficient documentation

## 2019-03-02 DIAGNOSIS — I132 Hypertensive heart and chronic kidney disease with heart failure and with stage 5 chronic kidney disease, or end stage renal disease: Secondary | ICD-10-CM | POA: Diagnosis not present

## 2019-03-02 DIAGNOSIS — Z992 Dependence on renal dialysis: Secondary | ICD-10-CM | POA: Diagnosis not present

## 2019-03-02 DIAGNOSIS — Z951 Presence of aortocoronary bypass graft: Secondary | ICD-10-CM | POA: Insufficient documentation

## 2019-03-02 DIAGNOSIS — R195 Other fecal abnormalities: Secondary | ICD-10-CM | POA: Diagnosis present

## 2019-03-02 DIAGNOSIS — Z79899 Other long term (current) drug therapy: Secondary | ICD-10-CM | POA: Diagnosis not present

## 2019-03-02 DIAGNOSIS — I259 Chronic ischemic heart disease, unspecified: Secondary | ICD-10-CM | POA: Diagnosis not present

## 2019-03-02 DIAGNOSIS — N186 End stage renal disease: Secondary | ICD-10-CM | POA: Insufficient documentation

## 2019-03-02 DIAGNOSIS — Z8719 Personal history of other diseases of the digestive system: Secondary | ICD-10-CM

## 2019-03-02 DIAGNOSIS — R0602 Shortness of breath: Secondary | ICD-10-CM

## 2019-03-02 LAB — BASIC METABOLIC PANEL
Anion gap: 13 (ref 5–15)
BUN: 38 mg/dL — ABNORMAL HIGH (ref 8–23)
CO2: 29 mmol/L (ref 22–32)
Calcium: 9.3 mg/dL (ref 8.9–10.3)
Chloride: 96 mmol/L — ABNORMAL LOW (ref 98–111)
Creatinine, Ser: 6.61 mg/dL — ABNORMAL HIGH (ref 0.44–1.00)
GFR calc Af Amer: 6 mL/min — ABNORMAL LOW (ref >60)
GFR calc non Af Amer: 5 mL/min — ABNORMAL LOW (ref >60)
Glucose, Bld: 103 mg/dL — ABNORMAL HIGH (ref 70–99)
Potassium: 4 mmol/L (ref 3.5–5.1)
Sodium: 138 mmol/L (ref 135–145)

## 2019-03-02 LAB — CBC
HCT: 32 % — ABNORMAL LOW (ref 36.0–46.0)
Hemoglobin: 10.1 g/dL — ABNORMAL LOW (ref 12.0–15.0)
MCH: 34 pg (ref 26.0–34.0)
MCHC: 31.6 g/dL (ref 30.0–36.0)
MCV: 107.7 fL — ABNORMAL HIGH (ref 80.0–100.0)
Platelets: 189 10*3/uL (ref 150–400)
RBC: 2.97 MIL/uL — ABNORMAL LOW (ref 3.87–5.11)
RDW: 17.9 % — ABNORMAL HIGH (ref 11.5–15.5)
WBC: 6.3 10*3/uL (ref 4.0–10.5)
nRBC: 0 % (ref 0.0–0.2)

## 2019-03-02 LAB — POC OCCULT BLOOD, ED: Fecal Occult Bld: POSITIVE — AB

## 2019-03-02 LAB — TYPE AND SCREEN
ABO/RH(D): O POS
Antibody Screen: NEGATIVE

## 2019-03-02 IMAGING — CR PORTABLE CHEST - 1 VIEW
1 series · 1 of 1 positions shown · non-contrast
Comparison: Chest radiograph [DATE]. Lung bases from abdominal
CT earlier this day.

CLINICAL DATA: Shortness breath. Cough

EXAM:
PORTABLE CHEST 1 VIEW

[portable]
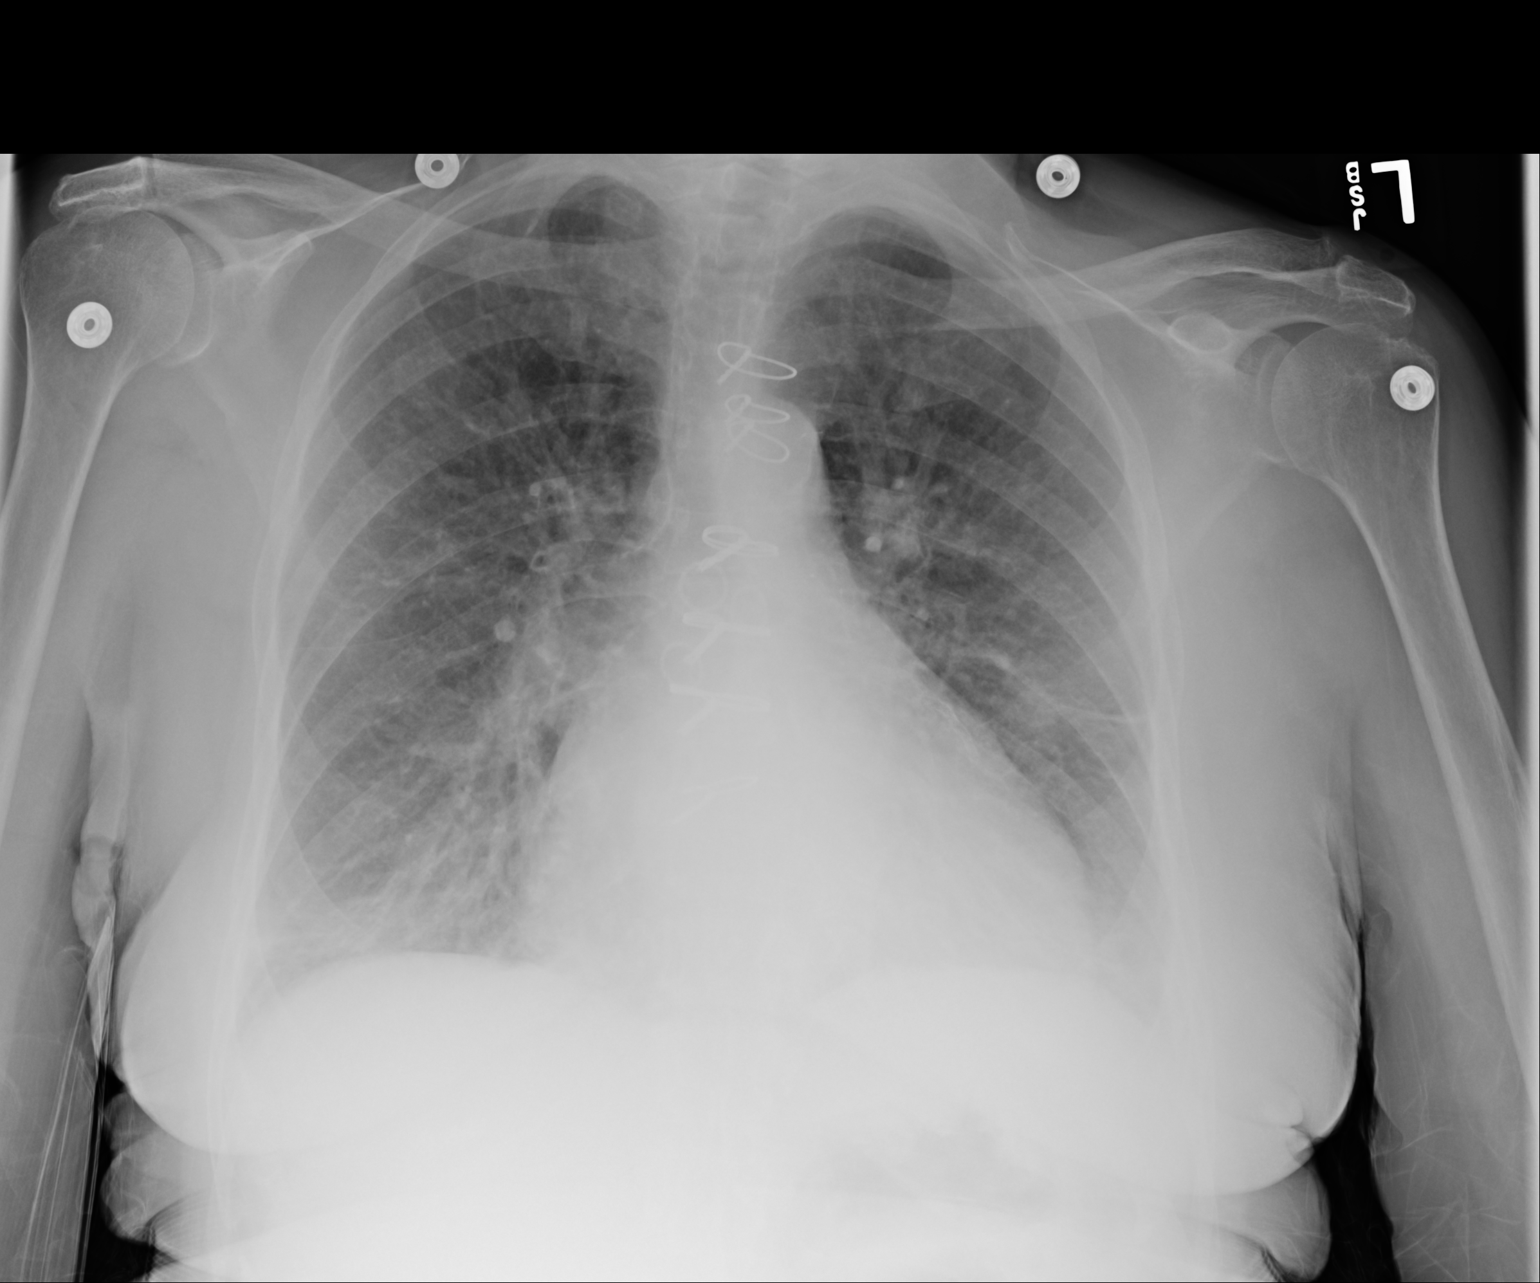

[1 of 1 positions shown; findings below may reference images not displayed]

FINDINGS: Post median sternotomy. Similar cardiomegaly. Unchanged mediastinal
contours. Increased interstitial and bronchial thickening since
prior exam. Linear scarring in the left mid lung. No pleural
effusion or pneumothorax. Stable osseous structures.
IMPRESSION: Increased interstitial and bronchial thickening since prior exam,
favoring pulmonary edema. Acute infection is also considered. Stable
cardiomegaly.

## 2019-03-02 IMAGING — CT CT ABDOMEN AND PELVIS WITHOUT CONTRAST
2 of 4 series · 15 of 46 positions shown, 17 images · non-contrast
Comparison: [DATE]

CLINICAL DATA: Black stools for 3 days.  Abdominal pain.

EXAM:
CT ABDOMEN AND PELVIS WITHOUT CONTRAST
TECHNIQUE: Multidetector CT imaging of the abdomen and pelvis was performed
following the standard protocol without IV contrast.

[Series 2: axial st · axial · 0.65mm/px · z∈[-440,-80]mm · 12 of 80 slices shown, 14 images]
[im 4/80  soft-tissue]
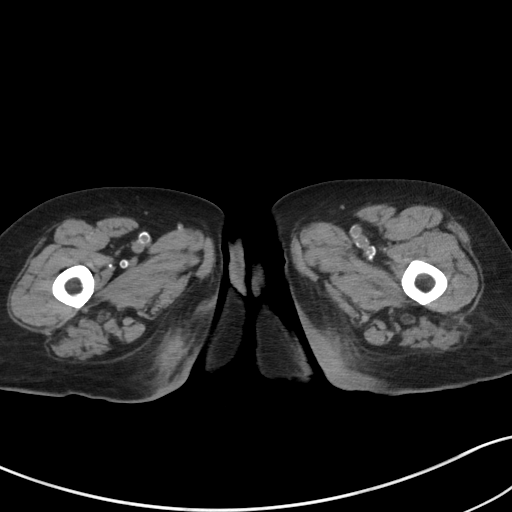
[im 4/80  bone]
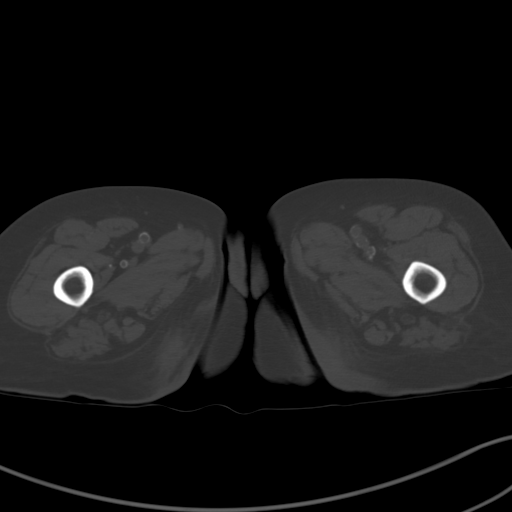
[im 11/80  soft-tissue]
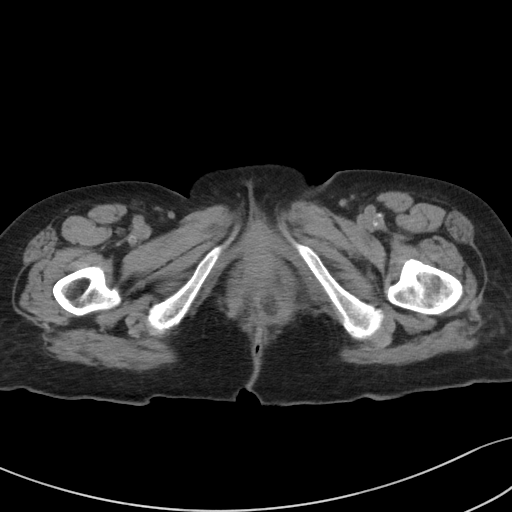
[im 18/80  soft-tissue]
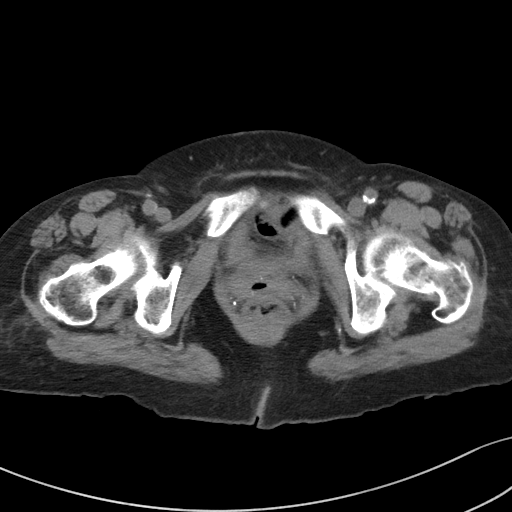
[im 25/80  soft-tissue]
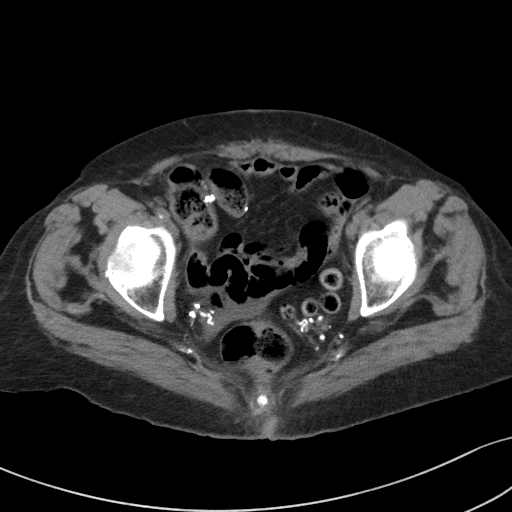
[im 31/80  soft-tissue]
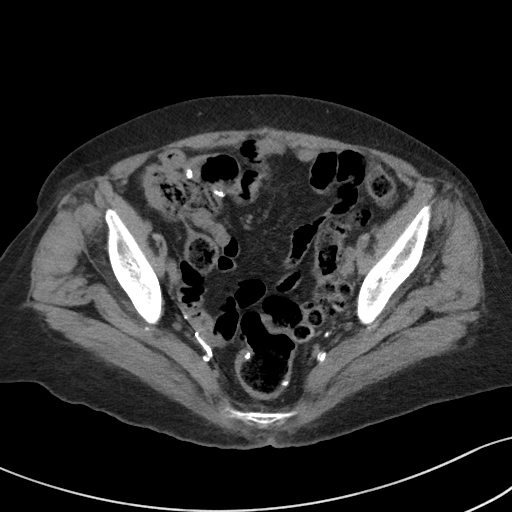
[im 38/80  soft-tissue]
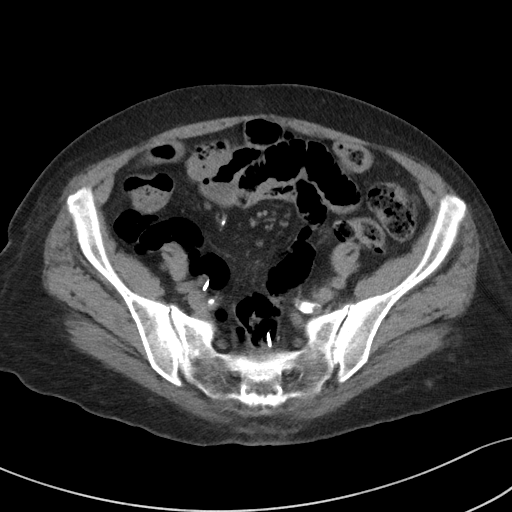
[im 42/80  soft-tissue]
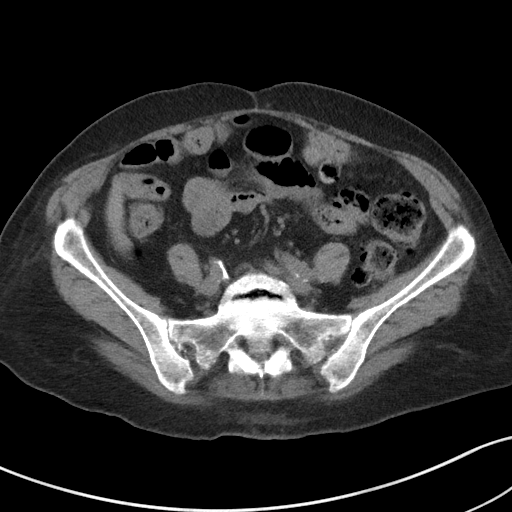
[im 49/80  soft-tissue]
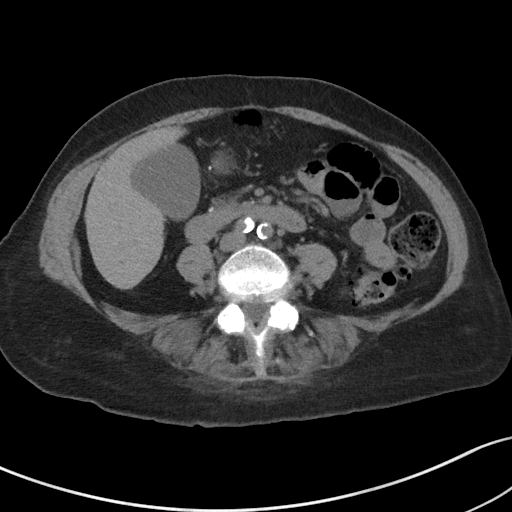
[im 55/80  soft-tissue]
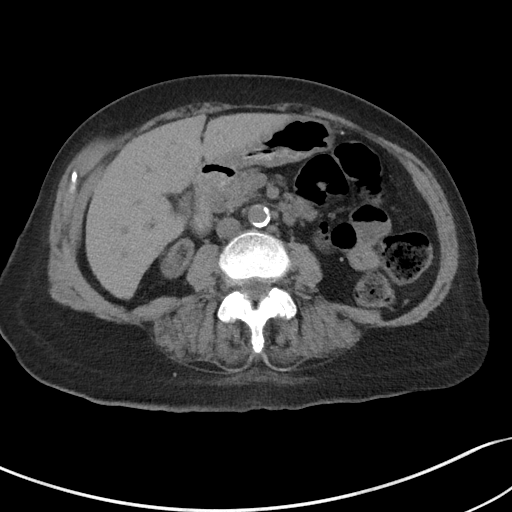
[im 55/80  bone]
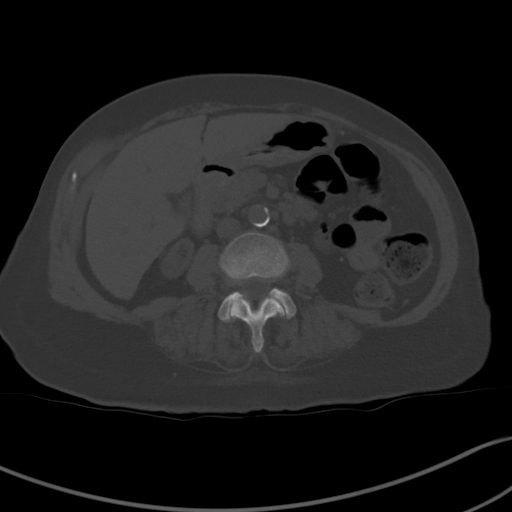
[im 62/80  soft-tissue]
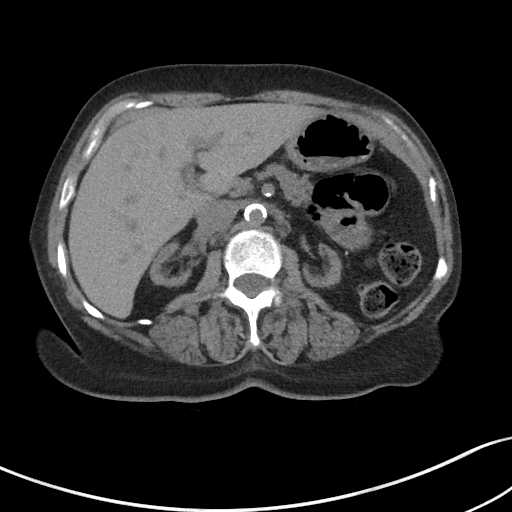
[im 69/80  soft-tissue]
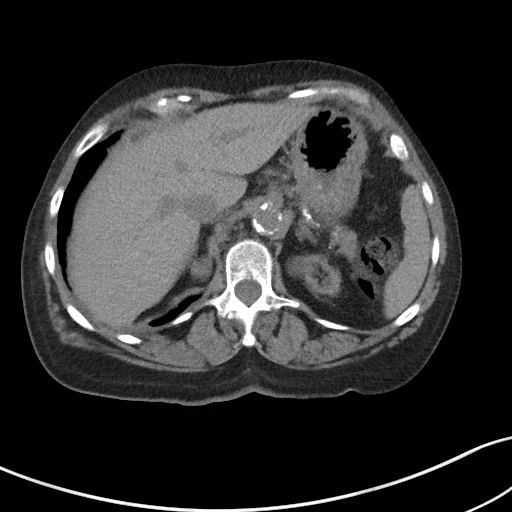
[im 76/80  soft-tissue]
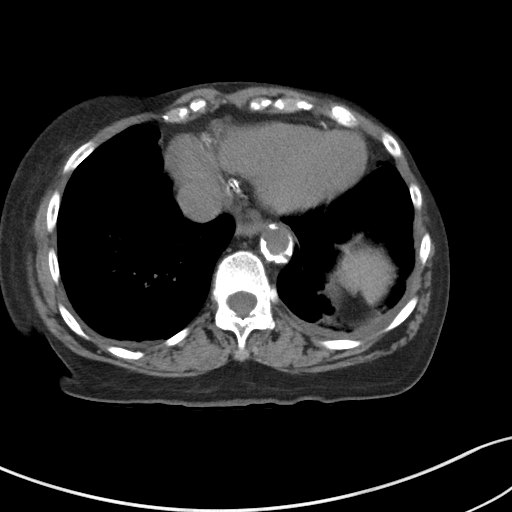

[Series 5: coronal st · coronal · 0.60mm/px · 3 of 73 slices shown]
[im 25/73  soft-tissue]
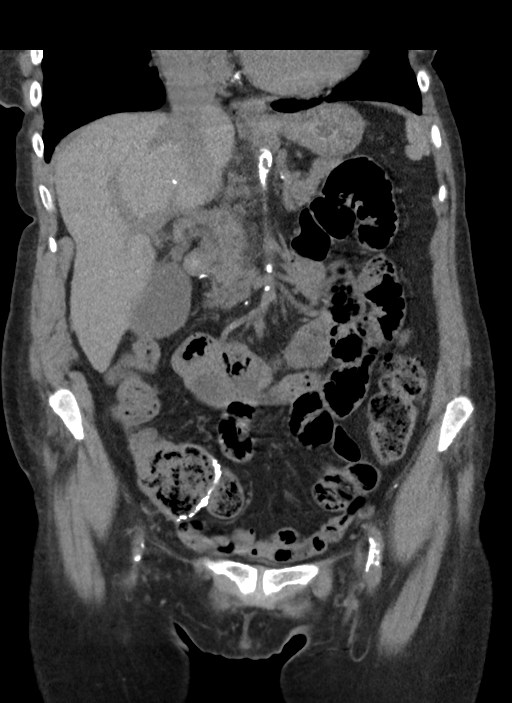
[im 33/73  soft-tissue]
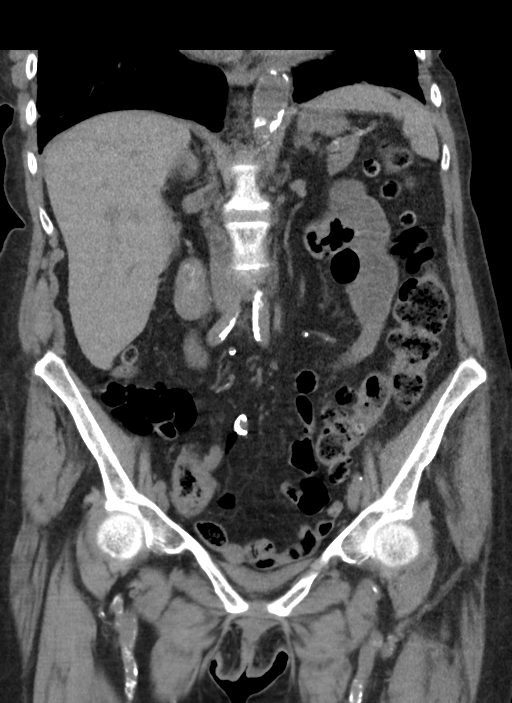
[im 41/73  soft-tissue]
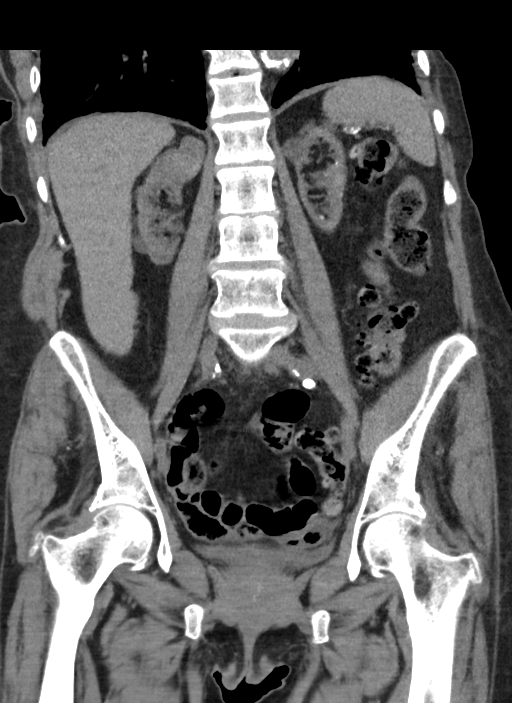

[15 of 46 positions shown; findings below may reference images not displayed]

FINDINGS: Lower chest: Bronchial wall thickening noted both lower lobes with
interstitial and ground-glass airspace opacity, right greater than
left.

Hepatobiliary: No focal abnormality in the liver on this study
without intravenous contrast. There is no evidence for gallstones,
gallbladder wall thickening, or pericholecystic fluid. No
intrahepatic or extrahepatic biliary dilation.

Pancreas: No focal mass lesion. No dilatation of the main duct. No
intraparenchymal cyst. No peripancreatic edema.

Spleen: No splenomegaly. No focal mass lesion.

Adrenals/Urinary Tract: No adrenal nodule or mass. Kidneys are
atrophic with multiple low-density and intermediate density lesions,
incompletely characterized but similar to prior. No evidence for
hydroureter. Bladder decompressed.

Stomach/Bowel: Stomach is unremarkable. No gastric wall thickening.
No evidence of outlet obstruction. Duodenum is normally positioned
as is the ligament of Treitz. No small bowel wall thickening. No
small bowel dilatation. The appendix is not visualized, but there is
no edema or inflammation in the region of the cecum. Apparent staple
line noted in the right colon. Diverticuli are seen scattered along
the entire length of the colon without CT findings of
diverticulitis. Left colonic anastomosis noted near the rectosigmoid
junction.

Vascular/Lymphatic: There is abdominal aortic atherosclerosis
without aneurysm. There is no gastrohepatic or hepatoduodenal
ligament lymphadenopathy. No intraperitoneal or retroperitoneal
lymphadenopathy. No pelvic sidewall lymphadenopathy.

Reproductive: The uterus is surgically absent. There is no adnexal
mass.

Other: No intraperitoneal free fluid.

Musculoskeletal: No worrisome lytic or sclerotic osseous
abnormality.
IMPRESSION: 1. Interval development of interstitial and basilar airspace opacity
in both lungs. Basilar infection/inflammation a consideration.
2. Bilateral lesions of varying size and attenuation again noted in
the atrophic kidneys. The 3.0 x 2.3 cm heterogeneous lesion in
question involving the upper pole right kidney is stable. While this
cannot be definitively characterized, is not substantially changed
in 1 year since [DATE]. Continued attention on follow-up
recommended.
3. Diffuse diverticulosis without diverticulitis
4.  Aortic Atherosclerois ([DR]-170.0)

## 2019-03-02 NOTE — Discharge Instructions (Addendum)
Blood counts stable today.  But could change over time.  Your stool was heme positive for blood.  No gross blood.  CT scan of the abdomen without any findings other than diverticulosis.  Stay on your dialysis schedule Monday Wednesday Friday.  Call Dr. Olevia Perches office for follow-up.  May require colonoscopy for reevaluation of the heme positive stool.

## 2019-03-02 NOTE — ED Notes (Signed)
Pt is shouting in [place]  States she has been here 5 hours   "I should have had a room"

## 2019-03-02 NOTE — ED Provider Notes (Signed)
[facility] EMERGENCY DEPARTMENT Provider Note   CSN: [id number] Arrival date & time: 03/02/19  1042     History   Chief Complaint Chief Complaint  Patient presents with   Blood In Stools    HPI Shawna [name] is a 78 y.o. female.     Patient states she has been having black stools for at least 3 days.  States that is been associated with some generalized abdominal pain.  No nausea vomiting no diarrhea no gross blood.  No fevers no upper respiratory symptoms.  Past medical history significant for patient is a dialysis patient is getting dialysis for 9 years.  Normally dialyzed Monday Wednesdays and Fridays.  Patient last dialyzed on Friday.  Patient states she is been followed by Dr. Melony Overly in the past.     Past Medical History:  Diagnosis Date   Acute on chronic respiratory failure with hypoxia ([place]) 10/10/2016   Anxiety    Arthritis    AVM (arteriovenous malformation) of colon    CAD (coronary artery disease)    a. s/p CABG in 2013 b. most recent DES to D1 in 10/2016 with cath showing patent LIMA-LAD, SVG-RI, SVG-Mrg and SVG-RCA  c. NST in 02/2018 showing scar with no significant ischemia   Carotid artery disease ([place])    a. 68-34% LICA, 07/9620    Chronic bronchitis (HCC)    Chronic diastolic CHF (congestive heart failure) ([place])    a. 02/2012 Echo EF 60-65%, nl wall motion, Gr 1 DD, mod MR   Colon cancer ([place]) 1992   Esophageal stricture    ESRD on hemodialysis ([place])    ESRD due to HTN, started dialysis 2011 and gets HD at [facility] with Dr Hinda Lenis on MWF schedule.  Access is LUA AVF as of Sept 2014.    GERD (gastroesophageal reflux disease)    High cholesterol 12/2011   History of blood transfusion 07/2011; 12/2011; 01/2012 X 2; 04/2012   History of gout    History of lower GI bleeding    Hypertension    Iron deficiency anemia    Mitral regurgitation    a. Moderate by echo, 02/2012   Myocardial infarction ([place])    Ovarian cancer ([place]) 1992    Pneumonia ~ 2009   PUD (peptic ulcer disease)    TIA (transient ischemic attack)     Patient Active Problem List   Diagnosis Date Noted   Acute respiratory failure with hypoxia ([place]) 12/25/2018   Elevated troponin 12/14/2018   PAF (paroxysmal atrial fibrillation) ([place]) 08/06/2018   Chest pain at rest 07/13/2018   Hand steal syndrome ([place]) 08/01/2017   Anemia 07/14/2017   Coronary artery disease of bypass graft of native heart with stable angina pectoris ([place]) 06/05/2017   Mesenteric ischemia (HCC)    Diverticulitis    SBO (small bowel obstruction) ([place]) 03/22/2017   Enteritis    Complication of vascular access for dialysis 03/19/2017   Preoperative clearance 01/25/2017   Symptomatic anemia 10/24/2016   H/O non-ST elevation myocardial infarction (NSTEMI) 10/24/2016   Fluid overload 29/79/8921   Complication from renal dialysis device 10/10/2016   Non-ST elevation MI (NSTEMI) ([place])    Encounter for fitting and adjustment of vascular catheter    End stage renal disease ([place])    ESRD (end stage renal disease) ([place])    Heme positive stool    Demand ischemia ([place]) 07/27/2016   Hypertensive emergency 07/08/2016   Acute on chronic respiratory failure with hypoxia ([place])  Cardiac arrest [facility])    Palliative care encounter    Goals of care, counseling/discussion    Hypertensive crisis without congestive heart failure 05/09/2016   Acute pulmonary edema ([place]) 04/06/2016   Acute respiratory failure ([place]) 04/06/2016   Hypertensive crisis 01/27/2016   History of colon cancer 01/27/2016   History of ovarian cancer 01/27/2016   Hypertensive urgency 01/27/2016   Narrow complex tachycardia ([place]) 09/08/2015   SVT (supraventricular tachycardia) ([place]) 09/08/2015   Influenza A 08/30/2015   Acute on chronic diastolic CHF (congestive heart failure) ([place]) 05/04/2015   Unstable angina ([place]) 05/03/2015   Essential hypertension    Pain in joint, lower  leg 08/14/2014   Small bowel obstruction, partial ([place]) 05/29/2013   Chronic diastolic CHF (congestive heart failure) ([place]) 03/22/2013   GI bleeding 03/21/2013   Acute blood loss anemia 03/21/2013   Vaginal odor 03/12/2013   Vaginal discharge 03/12/2013   Occlusion and stenosis of carotid artery without mention of cerebral infarction 01/24/2013   Hx of CABG 07/05/2012   Carotid artery disease ([place]) 07/05/2012   Anemia of chronic kidney failure 07/03/2012   Secondary hyperparathyroidism ([place]) 07/03/2012   Mitral regurgitation 06/12/2012   Pneumonia 06/09/2012   Non-STEMI (non-ST elevated myocardial infarction) ([place]) 06/08/2012   Ischemic chest pain ([place]) 03/01/2012   AVM (arteriovenous malformation) of small bowel, acquired 01/20/2012   GERD (gastroesophageal reflux disease) 01/09/2012   HLD (hyperlipidemia) 01/05/2012   Atherosclerosis of native coronary artery of native heart without angina pectoris 12/16/2011   ESRD on hemodialysis ([place]) 12/16/2011    Past Surgical History:  Procedure Laterality Date   ABDOMINAL HYSTERECTOMY  1992   APPENDECTOMY  06/1990   AV FISTULA PLACEMENT  07/2009   left upper arm   AV FISTULA PLACEMENT Right 09/06/2016   Procedure: RIGHT FOREARM ARTERIOVENOUS (AV) GRAFT;  Surgeon: Elam Dutch, MD;  [facility]: [facility] OR;  Service: Vascular;  Laterality: Right;   AV FISTULA PLACEMENT N/A 02/24/2017   Procedure: INSERTION OF ARTERIOVENOUS (AV) GORE-TEX GRAFT ARM (BRACHIAL AXILLARY);  Surgeon: Katha Cabal, MD;  Location: [facility] ORS;  Service: Vascular;  Laterality: N/A;   [place] Right 09/06/2016   Procedure: REMOVAL OF Right Arm ARTERIOVENOUS GORETEX GRAFT and Vein Patch angioplasty of brachial artery;  Surgeon: Angelia Mould, MD;  Location: [place];  Service: Vascular;  Laterality: Right;   [place] WITH STENT PLACEMENT  12/15/11   "2"   CORONARY ANGIOPLASTY WITH  STENT PLACEMENT  y/2013   "1; makes total of 3" (05/02/2012)   CORONARY ARTERY BYPASS GRAFT  06/13/2012   Procedure: CORONARY ARTERY BYPASS GRAFTING (CABG);  Surgeon: Grace Isaac, MD;  Location: [place];  Service: Open Heart Surgery;  Laterality: N/A;  cabg x four;  using left internal mammary artery, and left leg greater saphenous vein harvested endoscopically   CORONARY STENT INTERVENTION N/A 10/13/2016   Procedure: Coronary Stent Intervention;  Surgeon: Troy Sine, MD;  Location: [place] CV LAB;  Service: Cardiovascular;  Laterality: N/A;   DIALYSIS/PERMA CATHETER REMOVAL N/A 04/18/2017   Procedure: DIALYSIS/PERMA CATHETER REMOVAL;  Surgeon: Katha Cabal, MD;  Location: [place] CV LAB;  Service: Cardiovascular;  Laterality: N/A;   DILATION AND CURETTAGE OF UTERUS     ESOPHAGOGASTRODUODENOSCOPY  01/20/2012   Procedure: ESOPHAGOGASTRODUODENOSCOPY (EGD);  Surgeon: Ladene Artist, MD,FACG;  Location: [facility] ENDOSCOPY;  Service: Endoscopy;  Laterality: N/A;   ESOPHAGOGASTRODUODENOSCOPY N/A 03/26/2013  Procedure: ESOPHAGOGASTRODUODENOSCOPY (EGD);  Surgeon: Irene Shipper, MD;  Location: [facility] ENDOSCOPY;  Service: Endoscopy;  Laterality: N/A;   ESOPHAGOGASTRODUODENOSCOPY N/A 04/30/2015   Procedure: ESOPHAGOGASTRODUODENOSCOPY (EGD);  Surgeon: Rogene Houston, MD;  [facility]: [facility]O [facility];  Service: Endoscopy;  Laterality: N/A;  1pm - moved to 10/20 @ 1:10   ESOPHAGOGASTRODUODENOSCOPY N/A 07/29/2016   Procedure: ESOPHAGOGASTRODUODENOSCOPY (EGD);  Surgeon: Manus Gunning, MD;  Location: [place];  Service: Gastroenterology;  Laterality: N/A;  enteroscopy   INTRAOPERATIVE TRANSESOPHAGEAL ECHOCARDIOGRAM  06/13/2012   Procedure: INTRAOPERATIVE TRANSESOPHAGEAL ECHOCARDIOGRAM;  Surgeon: Grace Isaac, MD;  Location: [place];  Service: Open Heart Surgery;  Laterality: N/A;   IR GENERIC HISTORICAL  07/26/2016   IR FLUORO GUIDE CV LINE RIGHT 07/26/2016 Greggory Keen, MD [facility]   IR GENERIC HISTORICAL  07/26/2016   IR US GUIDE VASC ACCESS RIGHT 07/26/2016 Greggory Keen, MD [facility]   IR GENERIC HISTORICAL  08/02/2016   IR US GUIDE VASC ACCESS RIGHT 08/02/2016 Greggory Keen, MD [facility]   IR GENERIC HISTORICAL  08/02/2016   IR FLUORO GUIDE CV LINE RIGHT 08/02/2016 Greggory Keen, MD [facility]   IR RADIOLOGY PERIPHERAL GUIDED IV START  03/28/2017   IR US GUIDE VASC ACCESS RIGHT  03/28/2017   LEFT HEART CATH AND CORONARY ANGIOGRAPHY N/A 09/20/2016   Procedure: Left Heart Cath and Coronary Angiography;  Surgeon: [name], MD;  Location: [place] CV LAB;  Service: Cardiovascular;  Laterality: N/A;   LEFT HEART CATH AND CORS/GRAFTS ANGIOGRAPHY N/A 10/13/2016   Procedure: Left Heart Cath and Cors/Grafts Angiography;  Surgeon: Troy Sine, MD;  Location: [place] CV LAB;  Service: Cardiovascular;  Laterality: N/A;   LEFT HEART CATH AND CORS/GRAFTS ANGIOGRAPHY N/A 07/13/2018   Procedure: LEFT HEART CATH AND CORS/GRAFTS ANGIOGRAPHY;  Surgeon: Martinique, Peter M, MD;  Location: [place] CV LAB;  Service: Cardiovascular;  Laterality: N/A;   LEFT HEART CATHETERIZATION WITH CORONARY ANGIOGRAM N/A 12/15/2011   Procedure: LEFT HEART CATHETERIZATION WITH CORONARY ANGIOGRAM;  Surgeon: Burnell Blanks, MD;  Location: [facility] CATH LAB;  Service: Cardiovascular;  Laterality: N/A;   LEFT HEART CATHETERIZATION WITH CORONARY ANGIOGRAM N/A 01/10/2012   Procedure: LEFT HEART CATHETERIZATION WITH CORONARY ANGIOGRAM;  Surgeon: Peter M Martinique, MD;  Location: [facility] CATH LAB;  Service: Cardiovascular;  Laterality: N/A;   LEFT HEART CATHETERIZATION WITH CORONARY ANGIOGRAM N/A 06/08/2012   Procedure: LEFT HEART CATHETERIZATION WITH CORONARY ANGIOGRAM;  Surgeon: Burnell Blanks, MD;  Location: [facility] CATH LAB;  Service: Cardiovascular;  Laterality: N/A;   LEFT HEART CATHETERIZATION WITH CORONARY/GRAFT ANGIOGRAM N/A 12/10/2013   Procedure: LEFT HEART CATHETERIZATION WITH  Beatrix Fetters;  Surgeon: [name], MD;  Location: [facility] CATH LAB;  Service: Cardiovascular;  Laterality: N/A;   OVARY SURGERY     ovarian cancer   REVISION OF ARTERIOVENOUS GORETEX GRAFT N/A 02/24/2017   Procedure: REVISION OF ARTERIOVENOUS GORETEX GRAFT (RESECTION);  Surgeon: Katha Cabal, MD;  Location: [facility] ORS;  Service: Vascular;  Laterality: N/A;   SHUNTOGRAM N/A 10/15/2013   Procedure: Fistulogram;  Surgeon: Serafina Mitchell, MD;  Location: [facility] CATH LAB;  Service: Cardiovascular;  Laterality: N/A;   THROMBECTOMY / ARTERIOVENOUS GRAFT REVISION  2011   left upper arm   TUBAL LIGATION  1980's   UPPER EXTREMITY ANGIOGRAPHY Bilateral 12/06/2016   Procedure: Upper Extremity Angiography;  Surgeon: Katha Cabal, MD;  Location: [place] CV LAB;  Service: Cardiovascular;  Laterality: Bilateral;   UPPER EXTREMITY INTERVENTION  Left 06/06/2017   Procedure: UPPER EXTREMITY INTERVENTION;  Surgeon: Katha Cabal, MD;  Location: [place] CV LAB;  Service: Cardiovascular;  Laterality: Left;     OB History    Gravida  2   Para  2   Term      Preterm  2   AB      Living  2     SAB      TAB      Ectopic      Multiple      Live Births               Home Medications    Prior to Admission medications   Medication Sig Start Date End Date Taking? Authorizing Provider  ALPRAZolam Duanne Moron) 0.5 MG tablet Take 0.5 mg by mouth daily. 10/16/18  Yes [provider]  amiodarone (PACERONE) 200 MG tablet Take 1 tablet (200 mg total) by mouth daily. 02/19/19  Yes Herminio Commons, MD  cloNIDine (CATAPRES) 0.1 MG tablet Take 1 tablet (0.1 mg total) by mouth 2 (two) times daily. 02/01/19  Yes Daune Perch, NP  isosorbide mononitrate (IMDUR) 60 MG 24 hr tablet Take 1 tablet (60 mg total) by mouth 2 (two) times a day for 30 days. 12/15/18 03/02/19 Yes Shah, Pratik D, DO  multivitamin (RENA-VIT) TABS tablet Take 1 tablet by mouth daily.   Yes  [provider]  sevelamer carbonate (RENVELA) 800 MG tablet Take 800-2,400 mg by mouth See admin instructions. Take 3 tablets (2400) by mouth twice daily with meals, take 1 tablet (800 mg) with snacks   Yes [provider]  simvastatin (ZOCOR) 20 MG tablet TAKE ONE TABLET (20 MG) BY MOUTH DAILY AT BEDTIME Patient taking differently: Take 20 mg by mouth daily at 6 PM. TAKE ONE TABLET (20 MG) BY MOUTH DAILY AT BEDTIME 04/03/18  Yes Herminio Commons, MD  ticagrelor (BRILINTA) 60 MG TABS tablet Take 1 tablet (60 mg total) by mouth 2 (two) times daily. 08/17/18  Yes Almyra Deforest, PA  carvedilol (COREG) 12.5 MG tablet Take 1 tablet (12.5 mg total) by mouth 2 (two) times daily with a meal for 30 days. Additional refills per PCP 12/27/18 01/26/19  Donne Hazel, MD  diphenhydrAMINE (BENADRYL) 25 mg capsule Take 50 mg by mouth daily as needed for itching or allergies.     [provider]  fluticasone (FLONASE) 50 MCG/ACT nasal spray Place 1 spray at bedtime as needed into both nostrils for allergies.     [provider]  guaiFENesin-codeine (ROBITUSSIN AC) 100-10 MG/5ML syrup Take 5 mLs by mouth 3 (three) times daily as needed for cough. 02/05/19   Virgel Manifold, MD  lidocaine-prilocaine (EMLA) cream Apply 1 application topically every Monday, Wednesday, and Friday. Prior to dialysis    [provider]  loratadine (CLARITIN) 10 MG tablet Take 1 tablet (10 mg total) by mouth daily as needed for allergies. 07/14/18   Hosie Poisson, MD  nitroGLYCERIN (NITROSTAT) 0.4 MG SL tablet DISSOLVE ONE TABLET UNDER THE TONGUE EVERY 5 MINUTES AS NEEDED FOR CHEST PAIN.  DO NOT EXCEED A TOTAL OF 3 DOSES IN 15 MINUTES Patient taking differently: Place 0.4 mg under the tongue every 5 (five) minutes as needed.  10/08/18   Herminio Commons, MD  omeprazole (PRILOSEC) 20 MG capsule Take 1 capsule by mouth once daily 01/31/19   Rogene Houston, MD    Family History Family History    Problem Relation Age of  Onset   Heart disease Mother        Heart Disease before age 62   Hyperlipidemia Mother    Hypertension Mother    Diabetes Mother    Heart attack Mother    Heart disease Father        Heart Disease before age 64   Hyperlipidemia Father    Hypertension Father    Diabetes Father    Diabetes Sister    Hypertension Sister    Diabetes Brother    Hyperlipidemia Brother    Heart attack Brother    Hypertension Sister    Heart attack Brother    Other Other        noncontributory for early CAD   Colon cancer Neg Hx    Esophageal cancer Neg Hx    Liver disease Neg Hx    Kidney disease Neg Hx    Colon polyps Neg Hx     Social History Social History   Tobacco Use   Smoking status: Never Smoker   Smokeless tobacco: Never Used  Substance Use Topics   Alcohol use: No    Alcohol/week: 0.0 standard drinks   Drug use: No     Allergies   Aspirin, Penicillins, Amlodipine, Bactrim [sulfamethoxazole-trimethoprim], Contrast media [iodinated diagnostic agents], Iron, Nitrofurantoin, Tylenol [acetaminophen], Gabapentin, Hydralazine, Dexilant [dexlansoprazole], Levaquin [levofloxacin in d5w], Morphine and related, Plavix [clopidogrel bisulfate], Protonix [pantoprazole sodium], and Venofer [ferric oxide]   Review of Systems Review of Systems  Constitutional: Negative for chills and fever.  HENT: Negative for congestion, rhinorrhea and sore throat.   Eyes: Negative for visual disturbance.  Respiratory: Negative for cough and shortness of breath.   Cardiovascular: Negative for chest pain and leg swelling.  Gastrointestinal: Positive for abdominal pain. Negative for diarrhea, nausea and vomiting.  Genitourinary: Negative for dysuria.  Musculoskeletal: Negative for back pain and neck pain.  Skin: Negative for rash.  Neurological: Negative for dizziness, light-headedness and headaches.  Hematological: Does not bruise/bleed easily.   Psychiatric/Behavioral: Negative for confusion.     Physical Exam Updated Vital Signs BP (!) 216/61    Pulse 66    Temp 98.3 F (36.8 C) (Oral)    Resp 16    Ht 1.575 m (5\' 2" )    Wt 57.2 kg    SpO2 98%    BMI 23.05 kg/m   Physical Exam Vitals signs and nursing note reviewed.  Constitutional:      General: She is not in acute distress.    Appearance: Normal appearance. She is well-developed.  HENT:     Head: Normocephalic and atraumatic.  Eyes:     Extraocular Movements: Extraocular movements intact.     Conjunctiva/sclera: Conjunctivae normal.     Pupils: Pupils are equal, round, and reactive to light.  Neck:     Musculoskeletal: Normal range of motion and neck supple.  Cardiovascular:     Rate and Rhythm: Normal rate and regular rhythm.     Heart sounds: No murmur.  Pulmonary:     Effort: Pulmonary effort is normal. No respiratory distress.     Breath sounds: Normal breath sounds.  Abdominal:     General: There is no distension.     Palpations: Abdomen is soft. There is no mass.     Tenderness: There is no abdominal tenderness.  Genitourinary:    Rectum: Guaiac result positive.     Comments: Patient stool dark brown not black in color no gross blood.  Hemoccult positive. Musculoskeletal: Normal range of motion.  Comments: Patient with AV fistula left upper extremity good thrill.  Skin:    General: Skin is warm and dry.  Neurological:     General: No focal deficit present.     Mental Status: She is alert. Mental status is at baseline.     Comments: Seems to elicit some mild memory problems.      ED Treatments / Results  Labs (all labs ordered are listed, but only abnormal results are displayed) Labs Reviewed  CBC - Abnormal; Notable for the following components:      Result Value   RBC 2.97 (*)    Hemoglobin 10.1 (*)    HCT 32.0 (*)    MCV 107.7 (*)    RDW 17.9 (*)    All other components within normal limits  BASIC METABOLIC PANEL - Abnormal; Notable  for the following components:   Chloride 96 (*)    Glucose, Bld 103 (*)    BUN 38 (*)    Creatinine, Ser 6.61 (*)    GFR calc non Af Amer 5 (*)    GFR calc Af Amer 6 (*)    All other components within normal limits  POC OCCULT BLOOD, ED - Abnormal; Notable for the following components:   Fecal Occult Bld POSITIVE (*)    All other components within normal limits  TYPE AND SCREEN    EKG None  Radiology Ct Abdomen Pelvis Wo Contrast  Result Date: 03/02/2019 CLINICAL DATA:  Black stools for 3 days.  Abdominal pain. EXAM: CT ABDOMEN AND PELVIS WITHOUT CONTRAST TECHNIQUE: Multidetector CT imaging of the abdomen and pelvis was performed following the standard protocol without IV contrast. COMPARISON:  02/14/2019 FINDINGS: Lower chest: Bronchial wall thickening noted both lower lobes with interstitial and ground-glass airspace opacity, right greater than left. Hepatobiliary: No focal abnormality in the liver on this study without intravenous contrast. There is no evidence for gallstones, gallbladder wall thickening, or pericholecystic fluid. No intrahepatic or extrahepatic biliary dilation. Pancreas: No focal mass lesion. No dilatation of the main duct. No intraparenchymal cyst. No peripancreatic edema. Spleen: No splenomegaly. No focal mass lesion. Adrenals/Urinary Tract: No adrenal nodule or mass. Kidneys are atrophic with multiple low-density and intermediate density lesions, incompletely characterized but similar to prior. No evidence for hydroureter. Bladder decompressed. Stomach/Bowel: Stomach is unremarkable. No gastric wall thickening. No evidence of outlet obstruction. Duodenum is normally positioned as is the ligament of Treitz. No small bowel wall thickening. No small bowel dilatation. The appendix is not visualized, but there is no edema or inflammation in the region of the cecum. Apparent staple line noted in the right colon. Diverticuli are seen scattered along the entire length of the  colon without CT findings of diverticulitis. Left colonic anastomosis noted near the rectosigmoid junction. Vascular/Lymphatic: There is abdominal aortic atherosclerosis without aneurysm. There is no gastrohepatic or hepatoduodenal ligament lymphadenopathy. No intraperitoneal or retroperitoneal lymphadenopathy. No pelvic sidewall lymphadenopathy. Reproductive: The uterus is surgically absent. There is no adnexal mass. Other: No intraperitoneal free fluid. Musculoskeletal: No worrisome lytic or sclerotic osseous abnormality. IMPRESSION: 1. Interval development of interstitial and basilar airspace opacity in both lungs. Basilar infection/inflammation a consideration. 2. Bilateral lesions of varying size and attenuation again noted in the atrophic kidneys. The 3.0 x 2.3 cm heterogeneous lesion in question involving the upper pole right kidney is stable. While this cannot be definitively characterized, is not substantially changed in 1 year since 12/28/2017. Continued attention on follow-up recommended. 3. Diffuse diverticulosis without diverticulitis 4.  Aortic  Atherosclerois (ICD10-170.0) Electronically Signed   By: Misty Stanley M.D.   On: 03/02/2019 19:56   Dg Chest Port 1 View  Result Date: 03/02/2019 CLINICAL DATA:  Shortness breath. Cough EXAM: PORTABLE CHEST 1 VIEW COMPARISON:  Chest radiograph 02/05/2019. Lung bases from abdominal CT earlier this day. FINDINGS: Post median sternotomy. Similar cardiomegaly. Unchanged mediastinal contours. Increased interstitial and bronchial thickening since prior exam. Linear scarring in the left mid lung. No pleural effusion or pneumothorax. Stable osseous structures. IMPRESSION: Increased interstitial and bronchial thickening since prior exam, favoring pulmonary edema. Acute infection is also considered. Stable cardiomegaly. Electronically Signed   By: Keith Rake M.D.   On: 03/02/2019 20:59    Procedures Procedures (including critical care time)  Medications  Ordered in ED Medications - No data to display   Initial Impression / Assessment and Plan / ED Course  I have reviewed the triage vital signs and the nursing notes.  Pertinent labs & imaging results that were available during my care of the patient were reviewed by me and considered in my medical decision making (see chart for details).        Patient's vital signs stable.  Hemoccult positive no gross blood not grossly black.  Hemoglobin at 10.  Not tachycardic not hypotensive oxygen sats are good.  Chest x-ray showed a little bit of vascular congestion.  Patient is due for dialysis on Monday.  Labs without any significant abnormalities.  No leukocytosis.  CT of the abdomen showed diverticulosis but no other acute findings.  Bleeding could be from the air or perhaps may be patient has bleeding from higher up in the GI tract.  Will refer her back to gastroenterology.  She think she is supposed to see them on Tuesday.  She understands is important to be checked eventually her hemoglobin will get low.  Patient does not meet criteria for admission at this time patient nontoxic no acute distress.  Final Clinical Impressions(s) / ED Diagnoses   Final diagnoses:  History of melena  ESRD on dialysis [facility])  Diverticulosis of colon    ED Discharge Orders    None       Fredia Sorrow, MD 03/02/19 2129

## 2019-03-02 NOTE — ED Triage Notes (Signed)
Pt has been having black stools for the last 3 days. States she has been "feeling terrible." States she is having abdominal pain as well.

## 2019-03-02 NOTE — ED Notes (Signed)
Pt returned from CT °

## 2019-03-05 ENCOUNTER — Encounter (INDEPENDENT_AMBULATORY_CARE_PROVIDER_SITE_OTHER): Payer: Self-pay | Admitting: Nurse Practitioner

## 2019-03-05 ENCOUNTER — Ambulatory Visit (INDEPENDENT_AMBULATORY_CARE_PROVIDER_SITE_OTHER): Payer: Medicare HMO | Admitting: Nurse Practitioner

## 2019-03-05 ENCOUNTER — Ambulatory Visit (INDEPENDENT_AMBULATORY_CARE_PROVIDER_SITE_OTHER): Payer: Medicare HMO | Admitting: Urology

## 2019-03-05 ENCOUNTER — Other Ambulatory Visit: Payer: Self-pay

## 2019-03-05 VITALS — BP 182/70 | HR 76 | Temp 98.2°F | Ht 61.0 in | Wt 123.5 lb

## 2019-03-05 DIAGNOSIS — D500 Iron deficiency anemia secondary to blood loss (chronic): Secondary | ICD-10-CM

## 2019-03-05 DIAGNOSIS — D3001 Benign neoplasm of right kidney: Secondary | ICD-10-CM

## 2019-03-05 DIAGNOSIS — K921 Melena: Secondary | ICD-10-CM | POA: Diagnosis not present

## 2019-03-05 DIAGNOSIS — K922 Gastrointestinal hemorrhage, unspecified: Secondary | ICD-10-CM

## 2019-03-05 NOTE — Addendum Note (Signed)
Addended by: Noralyn Pick on: 03/05/2019 01:39 PM   Modules accepted: Orders, SmartSet

## 2019-03-05 NOTE — Progress Notes (Addendum)
Subjective:    Patient ID: Shawna [name], female    DOB: [birth year], 78 y.o.   MRN: [id number]  HPI  Dvora K. Mcguirk is a 78 y.o. 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 She is on Brilinta 60 mg twice daily and aspirin 81 mg daily. She presents today for further evaluation regarding melena. She presented to [facility] emergency room on 03/02/2019 with complaints of passing dark, almost black stools for 3 weels.  She was concerned that she was bleeding from her stomach. Prior history of GIB in 2014, 2016 and 2018 from duodenal and jejunal AVMs.  ED course: Labs: Sodium 138.  BUN 38.  Creatinine 6.61.  WBC 6.3.  Hemoglobin 10.  Hematocrit 32.0. (Hg 8.4 on 6/25/).  MCV 107.7.  Platelet 189.  FOBT positive.  An abdominal/pelvic CAT scan without contrast:  1. Interval development of interstitial and basilar airspace opacity in both lungs. Basilar infection/inflammation a consideration. 2. Bilateral lesions of varying size and attenuation again noted in the atrophic kidneys. The 3.0 x 2.3 cm heterogeneous lesion in question involving the upper pole right kidney is stable. While this cannot be definitively characterized, is not substantially changed in 1 year since 12/28/2017. Continued attention on follow-up recommended. 3. Diffuse diverticulosis without diverticulitis 4.  Aortic Atherosclerois   She did not meet the criteria for hospital admission therefore she was discharged home with instruction to schedule a GI consult.  Currently, she denies having any upper or lower abdominal pain.  She continues to pass loose to solid dark, almost black stools once or twice daily.  She had one episode of watery  diarrhea with seedlike sediment 2 days ago without recurrence.  She denies having any nausea, dysphasia, heartburn or stomach pain.  No lower abdominal pain.  No rectal bleeding.  She is taking omeprazole 20 mg once daily.  In reviewing her chart, I discussed her previous hospital admission for GI bleeding in 2018, she does not recall the specifics of this hospital admission.  She was not aware of a prior diagnosis of duodenal and jejunal AVMs.  EGD 07/29/2016 by Dr. Pacific Cellar due to GIB/melena:  Esophagogastric landmarks identified. - 2 cm hiatal hernia. - Normal stomach. - A single duodenal versus jejunal polyp - suspect adenoma, not removed during this exam as above. - A single angiodysplastic lesion in the jejunum. This could be the cause for the patient's symptoms / anemia. Treated successfully with argon plasma coagulation (APC). Impression: No moderate  EGD 04/30/2015 by Dr. Hildred Laser done due to GIB/melena: Small sliding hiatal hernia with mild changes of reflux esophagitis limited to GE junction.Erosive gastroduodenitis but no evidence of peptic ulcer disease. Mild antral telangiectasia without stigmata of bleed  EGD 03/26/2013 by Dr. [name] at [facility]: nonbleeding duodenal AVMs s/p APC, incidental esophageal stricture.  Colonoscopy 08/04/2011 by Dr. Doristine Mango: 2 Tubular adenomatous polyps were  removed from the cecum and one tubular adenomatous polyp from the descending colon, diverticulosis throughout the colon, a surgical anastomosis involving the DC to rectum.  Past Medical History:  Diagnosis Date  . Acute on chronic respiratory failure with hypoxia ([place]) 10/10/2016  . Anxiety   . Arthritis   . AVM (arteriovenous malformation) of colon   .  CAD (coronary artery disease)    a. s/p CABG in 2013 b. most recent DES to D1 in 10/2016 with cath showing patent LIMA-LAD, SVG-RI, SVG-Mrg and SVG-RCA  c. NST in 02/2018 showing scar with no significant ischemia  .  Carotid artery disease ([place])    a. 27-06% LICA, 08/3760   . Chronic bronchitis ([place])   . Chronic diastolic CHF (congestive heart failure) ([place])    a. 02/2012 Echo EF 60-65%, nl wall motion, Gr 1 DD, mod MR  . Colon cancer ([place]) 1992  . Esophageal stricture   . ESRD on hemodialysis ([place])    ESRD due to HTN, started dialysis 2011 and gets HD at [facility] with Dr Hinda Lenis on MWF schedule.  Access is LUA AVF as of Sept 2014.   Marland Kitchen GERD (gastroesophageal reflux disease)   . High cholesterol 12/2011  . History of blood transfusion 07/2011; 12/2011; 01/2012 X 2; 04/2012  . History of gout   . History of lower GI bleeding   . Hypertension   . Iron deficiency anemia   . Mitral regurgitation    a. Moderate by echo, 02/2012  . Myocardial infarction ([place])   . Ovarian cancer ([place]) 1992  . Pneumonia ~ 2009  . PUD (peptic ulcer disease)   . TIA (transient ischemic attack)    Past Surgical History:  Procedure Laterality Date  . ABDOMINAL HYSTERECTOMY  1992  . APPENDECTOMY  06/1990  . AV FISTULA PLACEMENT  07/2009   left upper arm  . AV FISTULA PLACEMENT Right 09/06/2016   Procedure: RIGHT FOREARM ARTERIOVENOUS (AV) GRAFT;  Surgeon: Elam Dutch, MD;  Location: [facility] OR;  Service: Vascular;  Laterality: Right;  . AV FISTULA PLACEMENT N/A 02/24/2017   Procedure: INSERTION OF ARTERIOVENOUS (AV) GORE-TEX GRAFT ARM (BRACHIAL AXILLARY);  Surgeon: Katha Cabal, MD;  Location: [facility] ORS;  Service: Vascular;  Laterality: N/A;  . [place] Right 09/06/2016   Procedure: REMOVAL OF Right Arm ARTERIOVENOUS GORETEX GRAFT and Vein Patch angioplasty of brachial artery;  Surgeon: Angelia Mould, MD;  Location: [place];  Service: Vascular;  Laterality: Right;  . COLON RESECTION  1992  . COLON SURGERY    . CORONARY ANGIOPLASTY WITH STENT PLACEMENT  12/15/11   "2"  . CORONARY ANGIOPLASTY WITH STENT PLACEMENT  y/2013   "1; makes total of 3" (05/02/2012)  . CORONARY ARTERY BYPASS GRAFT  06/13/2012   Procedure:  CORONARY ARTERY BYPASS GRAFTING (CABG);  Surgeon: Grace Isaac, MD;  Location: [place];  Service: Open Heart Surgery;  Laterality: N/A;  cabg x four;  using left internal mammary artery, and left leg greater saphenous vein harvested endoscopically  . CORONARY STENT INTERVENTION N/A 10/13/2016   Procedure: Coronary Stent Intervention;  Surgeon: Troy Sine, MD;  Location: [place] CV LAB;  Service: Cardiovascular;  Laterality: N/A;  . DIALYSIS/PERMA CATHETER REMOVAL N/A 04/18/2017   Procedure: DIALYSIS/PERMA CATHETER REMOVAL;  Surgeon: Katha Cabal, MD;  Location: [place] CV LAB;  Service: Cardiovascular;  Laterality: N/A;  . DILATION AND CURETTAGE OF UTERUS    . ESOPHAGOGASTRODUODENOSCOPY  01/20/2012   Procedure: ESOPHAGOGASTRODUODENOSCOPY (EGD);  Surgeon: Ladene Artist, MD,FACG;  Location: [facility] ENDOSCOPY;  Service: Endoscopy;  Laterality: N/A;  . ESOPHAGOGASTRODUODENOSCOPY N/A 03/26/2013   Procedure: ESOPHAGOGASTRODUODENOSCOPY (EGD);  Surgeon: Irene Shipper, MD;  Location: [facility] ENDOSCOPY;  Service: Endoscopy;  Laterality: N/A;  . ESOPHAGOGASTRODUODENOSCOPY N/A 04/30/2015   Procedure: ESOPHAGOGASTRODUODENOSCOPY (EGD);  Surgeon: Rogene Houston, MD;  [facility];  Service: Endoscopy;  Laterality: N/A;  1pm - moved to 10/20 @ 1:10  . ESOPHAGOGASTRODUODENOSCOPY N/A 07/29/2016   Procedure: ESOPHAGOGASTRODUODENOSCOPY (EGD);  Surgeon: Manus Gunning, MD;  Location: [place];  Service: Gastroenterology;  Laterality: N/A;  enteroscopy  . INTRAOPERATIVE TRANSESOPHAGEAL ECHOCARDIOGRAM  06/13/2012   Procedure: INTRAOPERATIVE TRANSESOPHAGEAL ECHOCARDIOGRAM;  Surgeon: Grace Isaac, MD;  Location: [name];  Service: Open Heart Surgery;  Laterality: N/A;  . IR GENERIC HISTORICAL  07/26/2016   IR FLUORO GUIDE CV LINE RIGHT 07/26/2016 Greggory Keen, MD [facility]INTER[facility]  . IR GENERIC HISTORICAL  07/26/2016   IR US GUIDE VASC ACCESS RIGHT 07/26/2016 Greggory Keen, MD [facility]  .  IR GENERIC HISTORICAL  08/02/2016   IR US GUIDE VASC ACCESS RIGHT 08/02/2016 Greggory Keen, MD [facility]  . IR GENERIC HISTORICAL  08/02/2016   IR FLUORO GUIDE CV LINE RIGHT 08/02/2016 Greggory Keen, MD [facility]  . IR RADIOLOGY PERIPHERAL GUIDED IV START  03/28/2017  . IR US GUIDE VASC ACCESS RIGHT  03/28/2017  . LEFT HEART CATH AND CORONARY ANGIOGRAPHY N/A 09/20/2016   Procedure: Left Heart Cath and Coronary Angiography;  Surgeon: Shawna Crome, MD;  Location: [place] CV LAB;  Service: Cardiovascular;  Laterality: N/A;  . LEFT HEART CATH AND CORS/GRAFTS ANGIOGRAPHY N/A 10/13/2016   Procedure: Left Heart Cath and Cors/Grafts Angiography;  Surgeon: Troy Sine, MD;  Location: [place] CV LAB;  Service: Cardiovascular;  Laterality: N/A;  . LEFT HEART CATH AND CORS/GRAFTS ANGIOGRAPHY N/A 07/13/2018   Procedure: LEFT HEART CATH AND CORS/GRAFTS ANGIOGRAPHY;  Surgeon: Martinique, Peter M, MD;  Location: [place] CV LAB;  Service: Cardiovascular;  Laterality: N/A;  . LEFT HEART CATHETERIZATION WITH CORONARY ANGIOGRAM N/A 12/15/2011   Procedure: LEFT HEART CATHETERIZATION WITH CORONARY ANGIOGRAM;  Surgeon: Burnell Blanks, MD;  Location: [facility] CATH LAB;  Service: Cardiovascular;  Laterality: N/A;  . LEFT HEART CATHETERIZATION WITH CORONARY ANGIOGRAM N/A 01/10/2012   Procedure: LEFT HEART CATHETERIZATION WITH CORONARY ANGIOGRAM;  Surgeon: Peter M Martinique, MD;  Location: [facility] CATH LAB;  Service: Cardiovascular;  Laterality: N/A;  . LEFT HEART CATHETERIZATION WITH CORONARY ANGIOGRAM N/A 06/08/2012   Procedure: LEFT HEART CATHETERIZATION WITH CORONARY ANGIOGRAM;  Surgeon: Burnell Blanks, MD;  Location: [facility] CATH LAB;  Service: Cardiovascular;  Laterality: N/A;  . LEFT HEART CATHETERIZATION WITH CORONARY/GRAFT ANGIOGRAM N/A 12/10/2013   Procedure: LEFT HEART CATHETERIZATION WITH Beatrix Fetters;  Surgeon: [name], MD;  Location: [facility] CATH LAB;  Service: Cardiovascular;  Laterality:  N/A;  . OVARY SURGERY     ovarian cancer  . REVISION OF ARTERIOVENOUS GORETEX GRAFT N/A 02/24/2017   Procedure: REVISION OF ARTERIOVENOUS GORETEX GRAFT (RESECTION);  Surgeon: Katha Cabal, MD;  Location: [facility] ORS;  Service: Vascular;  Laterality: N/A;  [place] N/A 10/15/2013   Procedure: Fistulogram;  Surgeon: Serafina Mitchell, MD;  Location: [facility] CATH LAB;  Service: Cardiovascular;  Laterality: N/A;  . THROMBECTOMY / ARTERIOVENOUS GRAFT REVISION  2011   left upper arm  . TUBAL LIGATION  1980's  . UPPER EXTREMITY ANGIOGRAPHY Bilateral 12/06/2016   Procedure: Upper Extremity Angiography;  Surgeon: Katha Cabal, MD;  Location: [place] CV LAB;  Service: Cardiovascular;  Laterality: Bilateral;  . UPPER EXTREMITY INTERVENTION Left 06/06/2017   Procedure: UPPER EXTREMITY INTERVENTION;  Surgeon: Katha Cabal, MD;  Location: [place] CV LAB;  Service: Cardiovascular;  Laterality: Left;   Current Outpatient Medications on File Prior to Visit  Medication Sig  Dispense Refill  . ALPRAZolam (XANAX) 0.5 MG tablet Take 0.5 mg by mouth daily.    Marland Kitchen amiodarone (PACERONE) 200 MG tablet Take 1 tablet (200 mg total) by mouth daily. 90 tablet 1  . aspirin EC 81 MG tablet Take 81 mg by mouth daily.    . cloNIDine (CATAPRES) 0.1 MG tablet Take 1 tablet (0.1 mg total) by mouth 2 (two) times daily. 60 tablet 2  . diphenhydrAMINE (BENADRYL) 25 mg capsule Take 50 mg by mouth daily as needed for itching or allergies.     . fluticasone (FLONASE) 50 MCG/ACT nasal spray Place 1 spray at bedtime as needed into both nostrils for allergies.     Marland Kitchen guaiFENesin-codeine (ROBITUSSIN AC) 100-10 MG/5ML syrup Take 5 mLs by mouth 3 (three) times daily as needed for cough. 120 mL 0  . lidocaine-prilocaine (EMLA) cream Apply 1 application topically every Monday, Wednesday, and Friday. Prior to dialysis    . loratadine (CLARITIN) 10 MG tablet Take 1 tablet (10 mg total) by mouth daily as needed for allergies.  30 tablet 0  . multivitamin (RENA-VIT) TABS tablet Take 1 tablet by mouth daily.    Marland Kitchen omeprazole (PRILOSEC) 20 MG capsule Take 1 capsule by mouth once daily 90 capsule 0  . ticagrelor (BRILINTA) 60 MG TABS tablet Take 1 tablet (60 mg total) by mouth 2 (two) times daily. 60 tablet 6  . carvedilol (COREG) 12.5 MG tablet Take 1 tablet (12.5 mg total) by mouth 2 (two) times daily with a meal for 30 days. Additional refills per PCP 60 tablet 0  . isosorbide mononitrate (IMDUR) 60 MG 24 hr tablet Take 1 tablet (60 mg total) by mouth 2 (two) times a day for 30 days. 60 tablet 3  . nitroGLYCERIN (NITROSTAT) 0.4 MG SL tablet DISSOLVE ONE TABLET UNDER THE TONGUE EVERY 5 MINUTES AS NEEDED FOR CHEST PAIN.  DO NOT EXCEED A TOTAL OF 3 DOSES IN 15 MINUTES (Patient not taking: No sig reported) 25 tablet 2  . simvastatin (ZOCOR) 20 MG tablet TAKE ONE TABLET (20 MG) BY MOUTH DAILY AT BEDTIME (Patient not taking: Reported on 03/05/2019) 90 tablet 3   No current facility-administered medications on file prior to visit.    Allergies  Allergen Reactions  . Aspirin Other (See Comments)    High Doses Mess up her stomach; "makes my bowels have blood in them". Takes 81 mg EC Aspirin   . Penicillins Other (See Comments)    SYNCOPE? , "makes me real weak when I take it; like I'll pass out"  Has patient had a PCN reaction causing immediate rash, facial/tongue/throat swelling, SOB or lightheadedness with hypotension: Yes Has patient had a PCN reaction causing severe rash involving mucus membranes or skin necrosis: no Has patient had a PCN reaction that required hospitalization no Has patient had a PCN reaction occurring within the last 10 years: no If all of the above  . Amlodipine Swelling  . Bactrim [Sulfamethoxazole-Trimethoprim] Rash  . Contrast Media [Iodinated Diagnostic Agents] Itching  . Iron Itching and Other (See Comments)    "they gave me iron in dialysis; had to give me Benadryl cause I had to have the  iron" (05/02/2012)  . Nitrofurantoin Hives  . Tylenol [Acetaminophen] Itching and Other (See Comments)    Makes her feet on fire per pt  . Gabapentin Other (See Comments)    Unknown reaction  . Hydralazine Itching  . Dexilant [Dexlansoprazole] Other (See Comments)    Upset stomach  .  Levaquin [Levofloxacin In D5w] Rash  . Morphine And Related Itching and Other (See Comments)    Itching in feet  . Plavix [Clopidogrel Bisulfate] Rash  . Protonix [Pantoprazole Sodium] Rash  . Venofer [Ferric Oxide] Itching and Other (See Comments)    Patient reports using Benadryl prior to doses as [place] See HPI, all other systems reviewed and are negative    Objective:   Physical Exam\Blood pressure (!) 182/70, pulse 76, temperature 98.2 F (36.8 C), temperature source Oral, height 5\' 1"  (1.549 m), weight 123 lb 8 oz (56 kg).  General: 79 year old female with a slow but stable gait in no acute distress Eyes: Sclera nonicteric, injected have a pink Mouth: Dentition intact, no lesions or ulcers Neck: Supple, no lymphadenopathy or thyromegaly Heart: Regular rate and rhythm, 1/6 systolic murmur Lungs: Breath sounds clear throughout Abdomen: Soft, nontender, no masses or organomegaly.  Positive bowel sounds to all 4 quadrants.  Small vertical scar left of the umbilicus intact. Rectal: Pasty black-brown stool grossly guaiac positive, anal tag without inflammation Extremities: No edema. LUE with AV graft + bruit and thrill Neuro: Alert and oriented x4, no focal deficits      Assessment & Plan:   46.  78 year old female history of upper GI bleed present presumed to be due to duodenal and jejunal AVMs presents with melena -Repeat CBC and iron studies -EGD with small bowel enteroscopy, benefits and risks discussed -She remains on Brilinta and aspirin at this time, await repeat H&H results before stopping either one (significant hx of CAD and TIA) -Patient is informed to go  to the emergency room if she develops frequent loose black stools, chest pain, shortness of breath or dizziness -Omeprazole 20 mg p.o. twice daily  2.  Significant coronary artery disease as reported in HPI on Brilinta and aspirin  3.  Chronic anemia.  03/02/2019 hemoglobin 10.1 and  hematocrit 32.0. Baseline hemoglobin 8.4 on 01/03/2019 -repeat H/H now  4.  End-stage renal disease on hemodialysis M-W-F  5.  Past history of colon cancer s/p colon resection 1992, last colonoscopy was in 2013 with removal of 3 tubular adenomatous polyps  6.  Past history of ovarian cancer  ADDENDUM: I consulted with Dr. Laural Golden. Patient to schedule a [place] small bowel capsule endoscopy asap on Brilinta and ASA, if negative, will re-discuss enteroscopy and colonoscopy.

## 2019-03-05 NOTE — Patient Instructions (Signed)
1. Complete the ordered blood tests today  2. Our office will contact you later today to schedule an upper endoscopy with small bowel enteroscopy   3. Increase Omeprazole 20mg  one capsule by mouth twice daily  4. If you develop frequent loose black stool, dizziness, shortness of breath or chest pain go to the local emergency room

## 2019-03-05 NOTE — H&P (View-Only) (Signed)
Subjective:    Patient ID: Shawna [name], female    DOB: 1941/05/15, 78 y.o.   MRN: [id number]  HPI  Shawna [name] is a 78 y.o. with a past medical history significant for coronary artery disease, status post CABG in 2013, and NSTEMI 2018, coronary stent placement [id number], diastolic CHF, mitral regurgitation, TIA,  anxiety, GI bleed secondary to duodenal and jejunal AVM, colon cancer 1992, ovarian cancer 1992, end-stage renal disease on dialysis every M-W-F. She is on Brilinta 60 mg twice daily and aspirin 81 mg daily. She presents today for further evaluation regarding melena. She presented to [facility] emergency room on 03/02/2019 with complaints of passing dark, almost black stools for 3 weels.  She was concerned that she was bleeding from her stomach. Prior history of GIB in 2014, 2016 and 2018 from duodenal and jejunal AVMs.  ED course: Labs: Sodium 138.  BUN 38.  Creatinine 6.61.  WBC 6.3.  Hemoglobin 10.  Hematocrit 32.0. (Hg 8.4 on 6/25/).  MCV 107.7.  Platelet 189.  FOBT positive.  An abdominal/pelvic CAT scan without contrast:  1. Interval development of interstitial and basilar airspace opacity in both lungs. Basilar infection/inflammation a consideration. 2. Bilateral lesions of varying size and attenuation again noted in the atrophic kidneys. The 3.0 x 2.3 cm heterogeneous lesion in question involving the upper pole right kidney is stable. While this cannot be definitively characterized, is not substantially changed in 1 year since 12/28/2017. Continued attention on follow-up recommended. 3. Diffuse diverticulosis without diverticulitis 4.  Aortic Atherosclerois   She did not meet the criteria for hospital admission therefore she was discharged home with instruction to schedule a GI consult.  Currently, she denies having any upper or lower abdominal pain.  She continues to pass loose to solid dark, almost black stools once or twice daily.  She had one episode of watery  diarrhea with seedlike sediment 2 days ago without recurrence.  She denies having any nausea, dysphasia, heartburn or stomach pain.  No lower abdominal pain.  No rectal bleeding.  She is taking omeprazole 20 mg once daily.  In reviewing her chart, I discussed her previous hospital admission for GI bleeding in 2018, she does not recall the specifics of this hospital admission.  She was not aware of a prior diagnosis of duodenal and jejunal AVMs.  EGD 07/29/2016 by Dr. Vintondale Cellar due to GIB/melena:  Esophagogastric landmarks identified. - 2 cm hiatal hernia. - Normal stomach. - A single duodenal versus jejunal polyp - suspect adenoma, not removed during this exam as above. - A single angiodysplastic lesion in the jejunum. This could be the cause for the patient's symptoms / anemia. Treated successfully with argon plasma coagulation (APC). Impression: No moderate  EGD 04/30/2015 by Dr. Hildred Laser done due to GIB/melena: Small sliding hiatal hernia with mild changes of reflux esophagitis limited to GE junction.Erosive gastroduodenitis but no evidence of peptic ulcer disease. Mild antral telangiectasia without stigmata of bleed  EGD 03/26/2013 by Dr. [name] at [facility]: nonbleeding duodenal AVMs s/p APC, incidental esophageal stricture.  Colonoscopy 08/04/2011 by Dr. Doristine Mango: 2 Tubular adenomatous polyps were  removed from the cecum and one tubular adenomatous polyp from the descending colon, diverticulosis throughout the colon, a surgical anastomosis involving the DC to rectum.  Past Medical History:  Diagnosis Date  . Acute on chronic respiratory failure with hypoxia ([place]) 10/10/2016  . Anxiety   . Arthritis   . AVM (arteriovenous malformation) of colon   .  CAD (coronary artery disease)    a. s/p CABG in 2013 b. most recent DES to D1 in 10/2016 with cath showing patent LIMA-LAD, SVG-RI, SVG-Mrg and SVG-RCA  c. NST in 02/2018 showing scar with no significant ischemia  .  Carotid artery disease ([place])    a. 16-10% LICA, 03/6044   . Chronic bronchitis ([place])   . Chronic diastolic CHF (congestive heart failure) ([place])    a. 02/2012 Echo EF 60-65%, nl wall motion, Gr 1 DD, mod MR  . Colon cancer ([place]) 1992  . Esophageal stricture   . ESRD on hemodialysis ([place])    ESRD due to HTN, started dialysis 2011 and gets HD at [facility] with Dr Hinda Lenis on MWF schedule.  Access is LUA AVF as of Sept 2014.   Marland Kitchen GERD (gastroesophageal reflux disease)   . High cholesterol 12/2011  . History of blood transfusion 07/2011; 12/2011; 01/2012 X 2; 04/2012  . History of gout   . History of lower GI bleeding   . Hypertension   . Iron deficiency anemia   . Mitral regurgitation    a. Moderate by echo, 02/2012  . Myocardial infarction ([place])   . Ovarian cancer ([place]) 1992  . Pneumonia ~ 2009  . PUD (peptic ulcer disease)   . TIA (transient ischemic attack)    Past Surgical History:  Procedure Laterality Date  . ABDOMINAL HYSTERECTOMY  1992  . APPENDECTOMY  06/1990  . AV FISTULA PLACEMENT  07/2009   left upper arm  . AV FISTULA PLACEMENT Right 09/06/2016   Procedure: RIGHT FOREARM ARTERIOVENOUS (AV) GRAFT;  Surgeon: Elam Dutch, MD;  Location: [name] OR;  Service: Vascular;  Laterality: Right;  . AV FISTULA PLACEMENT N/A 02/24/2017   Procedure: INSERTION OF ARTERIOVENOUS (AV) GORE-TEX GRAFT ARM (BRACHIAL AXILLARY);  Surgeon: Katha Cabal, MD;  Location: [facility] ORS;  Service: Vascular;  Laterality: N/A;  . [place] Right 09/06/2016   Procedure: REMOVAL OF Right Arm ARTERIOVENOUS GORETEX GRAFT and Vein Patch angioplasty of brachial artery;  Surgeon: Angelia Mould, MD;  Location: [place];  Service: Vascular;  Laterality: Right;  . COLON RESECTION  1992  . COLON SURGERY    . CORONARY ANGIOPLASTY WITH STENT PLACEMENT  12/15/11   "2"  . CORONARY ANGIOPLASTY WITH STENT PLACEMENT  y/2013   "1; makes total of 3" (05/02/2012)  . CORONARY ARTERY BYPASS GRAFT  06/13/2012   Procedure:  CORONARY ARTERY BYPASS GRAFTING (CABG);  Surgeon: Grace Isaac, MD;  Location: [place];  Service: Open Heart Surgery;  Laterality: N/A;  cabg x four;  using left internal mammary artery, and left leg greater saphenous vein harvested endoscopically  . CORONARY STENT INTERVENTION N/A 10/13/2016   Procedure: Coronary Stent Intervention;  Surgeon: Troy Sine, MD;  Location: [place] CV LAB;  Service: Cardiovascular;  Laterality: N/A;  . DIALYSIS/PERMA CATHETER REMOVAL N/A 04/18/2017   Procedure: DIALYSIS/PERMA CATHETER REMOVAL;  Surgeon: Katha Cabal, MD;  Location: [place] CV LAB;  Service: Cardiovascular;  Laterality: N/A;  . DILATION AND CURETTAGE OF UTERUS    . ESOPHAGOGASTRODUODENOSCOPY  01/20/2012   Procedure: ESOPHAGOGASTRODUODENOSCOPY (EGD);  Surgeon: Ladene Artist, MD,FACG;  Location: [facility] ENDOSCOPY;  Service: Endoscopy;  Laterality: N/A;  . ESOPHAGOGASTRODUODENOSCOPY N/A 03/26/2013   Procedure: ESOPHAGOGASTRODUODENOSCOPY (EGD);  Surgeon: Irene Shipper, MD;  Location: [facility] ENDOSCOPY;  Service: Endoscopy;  Laterality: N/A;  . ESOPHAGOGASTRODUODENOSCOPY N/A 04/30/2015   Procedure: ESOPHAGOGASTRODUODENOSCOPY (EGD);  Surgeon: Rogene [place], MD;  [facility];  Service: Endoscopy;  Laterality: N/A;  1pm - moved to 10/20 @ 1:10  . ESOPHAGOGASTRODUODENOSCOPY N/A 07/29/2016   Procedure: ESOPHAGOGASTRODUODENOSCOPY (EGD);  Surgeon: Manus Gunning, MD;  Location: [place];  Service: Gastroenterology;  Laterality: N/A;  enteroscopy  . INTRAOPERATIVE TRANSESOPHAGEAL ECHOCARDIOGRAM  06/13/2012   Procedure: INTRAOPERATIVE TRANSESOPHAGEAL ECHOCARDIOGRAM;  Surgeon: Grace Isaac, MD;  Location: [place];  Service: Open Heart Surgery;  Laterality: N/A;  . IR GENERIC HISTORICAL  07/26/2016   IR FLUORO GUIDE CV LINE RIGHT 07/26/2016 Greggory Keen, MD [facility]  . IR GENERIC HISTORICAL  07/26/2016   IR US GUIDE VASC ACCESS RIGHT 07/26/2016 Greggory Keen, MD [facility]  .  IR GENERIC HISTORICAL  08/02/2016   IR US GUIDE VASC ACCESS RIGHT 08/02/2016 Greggory Keen, MD [facility]  . IR GENERIC HISTORICAL  08/02/2016   IR FLUORO GUIDE CV LINE RIGHT 08/02/2016 Greggory Keen, MD [facility]  . IR RADIOLOGY PERIPHERAL GUIDED IV START  03/28/2017  . IR US GUIDE VASC ACCESS RIGHT  03/28/2017  . LEFT HEART CATH AND CORONARY ANGIOGRAPHY N/A 09/20/2016   Procedure: Left Heart Cath and Coronary Angiography;  Surgeon: Shawna Crome, MD;  Location: [place] CV LAB;  Service: Cardiovascular;  Laterality: N/A;  . LEFT HEART CATH AND CORS/GRAFTS ANGIOGRAPHY N/A 10/13/2016   Procedure: Left Heart Cath and Cors/Grafts Angiography;  Surgeon: Troy Sine, MD;  Location: [place] CV LAB;  Service: Cardiovascular;  Laterality: N/A;  . LEFT HEART CATH AND CORS/GRAFTS ANGIOGRAPHY N/A 07/13/2018   Procedure: LEFT HEART CATH AND CORS/GRAFTS ANGIOGRAPHY;  Surgeon: Martinique, Peter M, MD;  Location: [place] CV LAB;  Service: Cardiovascular;  Laterality: N/A;  . LEFT HEART CATHETERIZATION WITH CORONARY ANGIOGRAM N/A 12/15/2011   Procedure: LEFT HEART CATHETERIZATION WITH CORONARY ANGIOGRAM;  Surgeon: Burnell Blanks, MD;  Location: [facility] CATH LAB;  Service: Cardiovascular;  Laterality: N/A;  . LEFT HEART CATHETERIZATION WITH CORONARY ANGIOGRAM N/A 01/10/2012   Procedure: LEFT HEART CATHETERIZATION WITH CORONARY ANGIOGRAM;  Surgeon: Peter M Martinique, MD;  Location: [facility] CATH LAB;  Service: Cardiovascular;  Laterality: N/A;  . LEFT HEART CATHETERIZATION WITH CORONARY ANGIOGRAM N/A 06/08/2012   Procedure: LEFT HEART CATHETERIZATION WITH CORONARY ANGIOGRAM;  Surgeon: Burnell Blanks, MD;  Location: [facility] CATH LAB;  Service: Cardiovascular;  Laterality: N/A;  . LEFT HEART CATHETERIZATION WITH CORONARY/GRAFT ANGIOGRAM N/A 12/10/2013   Procedure: LEFT HEART CATHETERIZATION WITH Beatrix Fetters;  Surgeon: [name], MD;  Location: [facility] CATH LAB;  Service: Cardiovascular;  Laterality:  N/A;  . OVARY SURGERY     ovarian cancer  . REVISION OF ARTERIOVENOUS GORETEX GRAFT N/A 02/24/2017   Procedure: REVISION OF ARTERIOVENOUS GORETEX GRAFT (RESECTION);  Surgeon: Katha Cabal, MD;  Location: [facility] ORS;  Service: Vascular;  Laterality: N/A;  Earney Mallet N/A 10/15/2013   Procedure: Fistulogram;  Surgeon: Serafina Mitchell, MD;  Location: [facility] CATH LAB;  Service: Cardiovascular;  Laterality: N/A;  . THROMBECTOMY / ARTERIOVENOUS GRAFT REVISION  2011   left upper arm  . TUBAL LIGATION  1980's  . UPPER EXTREMITY ANGIOGRAPHY Bilateral 12/06/2016   Procedure: Upper Extremity Angiography;  Surgeon: Katha Cabal, MD;  Location: [place] CV LAB;  Service: Cardiovascular;  Laterality: Bilateral;  . UPPER EXTREMITY INTERVENTION Left 06/06/2017   Procedure: UPPER EXTREMITY INTERVENTION;  Surgeon: Katha Cabal, MD;  Location: [place] CV LAB;  Service: Cardiovascular;  Laterality: Left;   Current Outpatient Medications on File Prior to Visit  Medication Sig  Dispense Refill  . ALPRAZolam (XANAX) 0.5 MG tablet Take 0.5 mg by mouth daily.    Marland Kitchen amiodarone (PACERONE) 200 MG tablet Take 1 tablet (200 mg total) by mouth daily. 90 tablet 1  . aspirin EC 81 MG tablet Take 81 mg by mouth daily.    . cloNIDine (CATAPRES) 0.1 MG tablet Take 1 tablet (0.1 mg total) by mouth 2 (two) times daily. 60 tablet 2  . diphenhydrAMINE (BENADRYL) 25 mg capsule Take 50 mg by mouth daily as needed for itching or allergies.     . fluticasone (FLONASE) 50 MCG/ACT nasal spray Place 1 spray at bedtime as needed into both nostrils for allergies.     Marland Kitchen guaiFENesin-codeine (ROBITUSSIN AC) 100-10 MG/5ML syrup Take 5 mLs by mouth 3 (three) times daily as needed for cough. 120 mL 0  . lidocaine-prilocaine (EMLA) cream Apply 1 application topically every Monday, Wednesday, and Friday. Prior to dialysis    . loratadine (CLARITIN) 10 MG tablet Take 1 tablet (10 mg total) by mouth daily as needed for allergies.  30 tablet 0  . multivitamin (RENA-VIT) TABS tablet Take 1 tablet by mouth daily.    Marland Kitchen omeprazole (PRILOSEC) 20 MG capsule Take 1 capsule by mouth once daily 90 capsule 0  . ticagrelor (BRILINTA) 60 MG TABS tablet Take 1 tablet (60 mg total) by mouth 2 (two) times daily. 60 tablet 6  . carvedilol (COREG) 12.5 MG tablet Take 1 tablet (12.5 mg total) by mouth 2 (two) times daily with a meal for 30 days. Additional refills per PCP 60 tablet 0  . isosorbide mononitrate (IMDUR) 60 MG 24 hr tablet Take 1 tablet (60 mg total) by mouth 2 (two) times a day for 30 days. 60 tablet 3  . nitroGLYCERIN (NITROSTAT) 0.4 MG SL tablet DISSOLVE ONE TABLET UNDER THE TONGUE EVERY 5 MINUTES AS NEEDED FOR CHEST PAIN.  DO NOT EXCEED A TOTAL OF 3 DOSES IN 15 MINUTES (Patient not taking: No sig reported) 25 tablet 2  . simvastatin (ZOCOR) 20 MG tablet TAKE ONE TABLET (20 MG) BY MOUTH DAILY AT BEDTIME (Patient not taking: Reported on 03/05/2019) 90 tablet 3   No current facility-administered medications on file prior to visit.    Allergies  Allergen Reactions  . Aspirin Other (See Comments)    High Doses Mess up her stomach; "makes my bowels have blood in them". Takes 81 mg EC Aspirin   . Penicillins Other (See Comments)    SYNCOPE? , "makes me real weak when I take it; like I'll pass out"  Has patient had a PCN reaction causing immediate rash, facial/tongue/throat swelling, SOB or lightheadedness with hypotension: Yes Has patient had a PCN reaction causing severe rash involving mucus membranes or skin necrosis: no Has patient had a PCN reaction that required hospitalization no Has patient had a PCN reaction occurring within the last 10 years: no If all of the above  . Amlodipine Swelling  . Bactrim [Sulfamethoxazole-Trimethoprim] Rash  . Contrast Media [Iodinated Diagnostic Agents] Itching  . Iron Itching and Other (See Comments)    "they gave me iron in dialysis; had to give me Benadryl cause I had to have the  iron" (05/02/2012)  . Nitrofurantoin Hives  . Tylenol [Acetaminophen] Itching and Other (See Comments)    Makes her feet on fire per pt  . Gabapentin Other (See Comments)    Unknown reaction  . Hydralazine Itching  . Dexilant [Dexlansoprazole] Other (See Comments)    Upset stomach  .  Levaquin [Levofloxacin In D5w] Rash  . Morphine And Related Itching and Other (See Comments)    Itching in feet  . Plavix [Clopidogrel Bisulfate] Rash  . Protonix [Pantoprazole Sodium] Rash  . Venofer [Ferric Oxide] Itching and Other (See Comments)    Patient reports using Benadryl prior to doses as [place] See HPI, all other systems reviewed and are negative    Objective:   Physical Exam\Blood pressure (!) 182/70, pulse 76, temperature 98.2 F (36.8 C), temperature source Oral, height 5\' 1"  (1.549 m), weight 123 lb 8 oz (56 kg).  General: 78 year old female with a slow but stable gait in no acute distress Eyes: Sclera nonicteric, injected have a pink Mouth: Dentition intact, no lesions or ulcers Neck: Supple, no lymphadenopathy or thyromegaly Heart: Regular rate and rhythm, 1/6 systolic murmur Lungs: Breath sounds clear throughout Abdomen: Soft, nontender, no masses or organomegaly.  Positive bowel sounds to all 4 quadrants.  Small vertical scar left of the umbilicus intact. Rectal: Pasty black-brown stool grossly guaiac positive, anal tag without inflammation Extremities: No edema. LUE with AV graft + bruit and thrill Neuro: Alert and oriented x4, no focal deficits      Assessment & Plan:   27.  78 year old female history of upper GI bleed present presumed to be due to duodenal and jejunal AVMs presents with melena -Repeat CBC and iron studies -EGD with small bowel enteroscopy, benefits and risks discussed -She remains on Brilinta and aspirin at this time, await repeat H&H results before stopping either one (significant hx of CAD and TIA) -Patient is informed to go  to the emergency room if she develops frequent loose black stools, chest pain, shortness of breath or dizziness -Omeprazole 20 mg p.o. twice daily  2.  Significant coronary artery disease as reported in HPI on Brilinta and aspirin  3.  Chronic anemia.  03/02/2019 hemoglobin 10.1 and  hematocrit 32.0. Baseline hemoglobin 8.4 on 01/03/2019 -repeat H/H now  4.  End-stage renal disease on hemodialysis M-W-F  5.  Past history of colon cancer s/p colon resection 1992, last colonoscopy was in 2013 with removal of 3 tubular adenomatous polyps  6.  Past history of ovarian cancer  ADDENDUM: I consulted with Dr. Laural Golden. Patient to schedule a Givens small bowel capsule endoscopy asap on Brilinta and ASA, if negative, will re-discuss enteroscopy and colonoscopy.

## 2019-03-06 DIAGNOSIS — K921 Melena: Secondary | ICD-10-CM | POA: Insufficient documentation

## 2019-03-06 LAB — COMPLETE METABOLIC PANEL WITH GFR
AG Ratio: 1.8 (calc) (ref 1.0–2.5)
ALT: 4 U/L — ABNORMAL LOW (ref 6–29)
AST: 4 U/L — ABNORMAL LOW (ref 10–35)
Albumin: 3.6 g/dL (ref 3.6–5.1)
Alkaline phosphatase (APISO): 60 U/L (ref 37–153)
BUN/Creatinine Ratio: 5 (calc) — ABNORMAL LOW (ref 6–22)
BUN: 35 mg/dL — ABNORMAL HIGH (ref 7–25)
CO2: 30 mmol/L (ref 20–32)
Calcium: 9.1 mg/dL (ref 8.6–10.4)
Chloride: 98 mmol/L (ref 98–110)
Creat: 6.57 mg/dL — ABNORMAL HIGH (ref 0.60–0.93)
GFR, Est African American: 6 mL/min/{1.73_m2} — ABNORMAL LOW (ref >=60)
GFR, Est Non African American: 6 mL/min/{1.73_m2} — ABNORMAL LOW (ref >=60)
Globulin: 2 g/dL (calc) (ref 1.9–3.7)
Glucose, Bld: 111 mg/dL (ref 65–139)
Potassium: 4.1 mmol/L (ref 3.5–5.3)
Sodium: 139 mmol/L (ref 135–146)
Total Bilirubin: 0.7 mg/dL (ref 0.2–1.2)
Total Protein: 5.6 g/dL — ABNORMAL LOW (ref 6.1–8.1)

## 2019-03-06 LAB — CBC WITH DIFFERENTIAL/PLATELET
Absolute Monocytes: 731 cells/uL (ref 200–950)
Basophils Absolute: 62 cells/uL (ref 0–200)
Basophils Relative: 0.9 %
Eosinophils Absolute: 221 cells/uL (ref 15–500)
Eosinophils Relative: 3.2 %
HCT: 26.9 % — ABNORMAL LOW (ref 35.0–45.0)
Hemoglobin: 8.6 g/dL — ABNORMAL LOW (ref 11.7–15.5)
Lymphs Abs: 752 cells/uL — ABNORMAL LOW (ref 850–3900)
MCH: 34 pg — ABNORMAL HIGH (ref 27.0–33.0)
MCHC: 32 g/dL (ref 32.0–36.0)
MCV: 106.3 fL — ABNORMAL HIGH (ref 80.0–100.0)
MPV: 14.4 fL — ABNORMAL HIGH (ref 7.5–12.5)
Monocytes Relative: 10.6 %
Neutro Abs: 5134 cells/uL (ref 1500–7800)
Neutrophils Relative %: 74.4 %
Platelets: 232 10*3/uL (ref 140–400)
RBC: 2.53 10*6/uL — ABNORMAL LOW (ref 3.80–5.10)
RDW: 15.7 % — ABNORMAL HIGH (ref 11.0–15.0)
Total Lymphocyte: 10.9 %
WBC: 6.9 10*3/uL (ref 3.8–10.8)

## 2019-03-06 LAB — IRON,TIBC AND FERRITIN PANEL
%SAT: 39 % (calc) (ref 16–45)
Ferritin: 998 ng/mL — ABNORMAL HIGH (ref 16–288)
Iron: 70 ug/dL (ref 45–160)
TIBC: 179 mcg/dL (calc) — ABNORMAL LOW (ref 250–450)

## 2019-03-07 ENCOUNTER — Encounter (HOSPITAL_COMMUNITY)
Admission: RE | Disposition: A | Discharge status: Home / Self Care | Payer: Self-pay | Source: Home / Self Care | Attending: Internal Medicine

## 2019-03-07 ENCOUNTER — Ambulatory Visit (HOSPITAL_COMMUNITY)
Admission: RE | Admit: 2019-03-07 | Discharge: 2019-03-07 | Disposition: A | Discharge status: Home / Self Care | Payer: Medicare HMO | Source: Home / Self Care | Attending: Internal Medicine | Admitting: Internal Medicine

## 2019-03-07 ENCOUNTER — Encounter (HOSPITAL_COMMUNITY): Payer: Self-pay | Admitting: Emergency Medicine

## 2019-03-07 ENCOUNTER — Telehealth (INDEPENDENT_AMBULATORY_CARE_PROVIDER_SITE_OTHER): Payer: Self-pay | Admitting: Nurse Practitioner

## 2019-03-07 ENCOUNTER — Inpatient Hospital Stay (HOSPITAL_COMMUNITY)
Admission: EM | Admit: 2019-03-07 | Discharge: 2019-03-10 | DRG: 377 | Disposition: A | Discharge status: Home / Self Care | Payer: Medicare HMO | Attending: Internal Medicine | Admitting: Internal Medicine

## 2019-03-07 ENCOUNTER — Other Ambulatory Visit: Payer: Self-pay

## 2019-03-07 DIAGNOSIS — D509 Iron deficiency anemia, unspecified: Secondary | ICD-10-CM | POA: Insufficient documentation

## 2019-03-07 DIAGNOSIS — K5750 Diverticulosis of both small and large intestine without perforation or abscess without bleeding: Secondary | ICD-10-CM | POA: Diagnosis present

## 2019-03-07 DIAGNOSIS — I4891 Unspecified atrial fibrillation: Secondary | ICD-10-CM | POA: Diagnosis present

## 2019-03-07 DIAGNOSIS — K921 Melena: Secondary | ICD-10-CM | POA: Diagnosis present

## 2019-03-07 DIAGNOSIS — Z79899 Other long term (current) drug therapy: Secondary | ICD-10-CM

## 2019-03-07 DIAGNOSIS — R739 Hyperglycemia, unspecified: Secondary | ICD-10-CM | POA: Diagnosis present

## 2019-03-07 DIAGNOSIS — I5032 Chronic diastolic (congestive) heart failure: Secondary | ICD-10-CM | POA: Diagnosis present

## 2019-03-07 DIAGNOSIS — Z86010 Personal history of colonic polyps: Secondary | ICD-10-CM | POA: Insufficient documentation

## 2019-03-07 DIAGNOSIS — Z885 Allergy status to narcotic agent status: Secondary | ICD-10-CM | POA: Insufficient documentation

## 2019-03-07 DIAGNOSIS — K635 Polyp of colon: Secondary | ICD-10-CM | POA: Diagnosis present

## 2019-03-07 DIAGNOSIS — Z888 Allergy status to other drugs, medicaments and biological substances status: Secondary | ICD-10-CM | POA: Insufficient documentation

## 2019-03-07 DIAGNOSIS — I2510 Atherosclerotic heart disease of native coronary artery without angina pectoris: Secondary | ICD-10-CM | POA: Insufficient documentation

## 2019-03-07 DIAGNOSIS — Z7982 Long term (current) use of aspirin: Secondary | ICD-10-CM | POA: Insufficient documentation

## 2019-03-07 DIAGNOSIS — K5710 Diverticulosis of small intestine without perforation or abscess without bleeding: Secondary | ICD-10-CM | POA: Insufficient documentation

## 2019-03-07 DIAGNOSIS — N186 End stage renal disease: Secondary | ICD-10-CM | POA: Diagnosis present

## 2019-03-07 DIAGNOSIS — Z9049 Acquired absence of other specified parts of digestive tract: Secondary | ICD-10-CM | POA: Insufficient documentation

## 2019-03-07 DIAGNOSIS — Z8701 Personal history of pneumonia (recurrent): Secondary | ICD-10-CM

## 2019-03-07 DIAGNOSIS — Z7902 Long term (current) use of antithrombotics/antiplatelets: Secondary | ICD-10-CM

## 2019-03-07 DIAGNOSIS — F419 Anxiety disorder, unspecified: Secondary | ICD-10-CM | POA: Insufficient documentation

## 2019-03-07 DIAGNOSIS — Z951 Presence of aortocoronary bypass graft: Secondary | ICD-10-CM | POA: Insufficient documentation

## 2019-03-07 DIAGNOSIS — E46 Unspecified protein-calorie malnutrition: Secondary | ICD-10-CM | POA: Diagnosis present

## 2019-03-07 DIAGNOSIS — K210 Gastro-esophageal reflux disease with esophagitis: Secondary | ICD-10-CM | POA: Diagnosis present

## 2019-03-07 DIAGNOSIS — I471 Supraventricular tachycardia, unspecified: Secondary | ICD-10-CM | POA: Diagnosis present

## 2019-03-07 DIAGNOSIS — D62 Acute posthemorrhagic anemia: Secondary | ICD-10-CM | POA: Diagnosis present

## 2019-03-07 DIAGNOSIS — K2980 Duodenitis without bleeding: Secondary | ICD-10-CM | POA: Insufficient documentation

## 2019-03-07 DIAGNOSIS — Z8673 Personal history of transient ischemic attack (TIA), and cerebral infarction without residual deficits: Secondary | ICD-10-CM

## 2019-03-07 DIAGNOSIS — Z90710 Acquired absence of both cervix and uterus: Secondary | ICD-10-CM

## 2019-03-07 DIAGNOSIS — K2981 Duodenitis with bleeding: Secondary | ICD-10-CM

## 2019-03-07 DIAGNOSIS — I480 Paroxysmal atrial fibrillation: Secondary | ICD-10-CM | POA: Diagnosis present

## 2019-03-07 DIAGNOSIS — Z8711 Personal history of peptic ulcer disease: Secondary | ICD-10-CM

## 2019-03-07 DIAGNOSIS — Z8543 Personal history of malignant neoplasm of ovary: Secondary | ICD-10-CM | POA: Insufficient documentation

## 2019-03-07 DIAGNOSIS — K2990 Gastroduodenitis, unspecified, without bleeding: Secondary | ICD-10-CM | POA: Diagnosis present

## 2019-03-07 DIAGNOSIS — D649 Anemia, unspecified: Secondary | ICD-10-CM | POA: Diagnosis present

## 2019-03-07 DIAGNOSIS — L299 Pruritus, unspecified: Secondary | ICD-10-CM | POA: Diagnosis present

## 2019-03-07 DIAGNOSIS — Z955 Presence of coronary angioplasty implant and graft: Secondary | ICD-10-CM | POA: Insufficient documentation

## 2019-03-07 DIAGNOSIS — I10 Essential (primary) hypertension: Secondary | ICD-10-CM | POA: Diagnosis present

## 2019-03-07 DIAGNOSIS — I5042 Chronic combined systolic (congestive) and diastolic (congestive) heart failure: Secondary | ICD-10-CM | POA: Diagnosis present

## 2019-03-07 DIAGNOSIS — I252 Old myocardial infarction: Secondary | ICD-10-CM | POA: Insufficient documentation

## 2019-03-07 DIAGNOSIS — K922 Gastrointestinal hemorrhage, unspecified: Secondary | ICD-10-CM | POA: Diagnosis not present

## 2019-03-07 DIAGNOSIS — Z85038 Personal history of other malignant neoplasm of large intestine: Secondary | ICD-10-CM

## 2019-03-07 DIAGNOSIS — E785 Hyperlipidemia, unspecified: Secondary | ICD-10-CM | POA: Diagnosis present

## 2019-03-07 DIAGNOSIS — M1990 Unspecified osteoarthritis, unspecified site: Secondary | ICD-10-CM | POA: Insufficient documentation

## 2019-03-07 DIAGNOSIS — Z992 Dependence on renal dialysis: Secondary | ICD-10-CM | POA: Insufficient documentation

## 2019-03-07 DIAGNOSIS — Z8249 Family history of ischemic heart disease and other diseases of the circulatory system: Secondary | ICD-10-CM

## 2019-03-07 DIAGNOSIS — Z833 Family history of diabetes mellitus: Secondary | ICD-10-CM

## 2019-03-07 DIAGNOSIS — J9601 Acute respiratory failure with hypoxia: Secondary | ICD-10-CM | POA: Diagnosis present

## 2019-03-07 DIAGNOSIS — Q2733 Arteriovenous malformation of digestive system vessel: Secondary | ICD-10-CM

## 2019-03-07 DIAGNOSIS — I340 Nonrheumatic mitral (valve) insufficiency: Secondary | ICD-10-CM | POA: Insufficient documentation

## 2019-03-07 DIAGNOSIS — E782 Mixed hyperlipidemia: Secondary | ICD-10-CM | POA: Diagnosis present

## 2019-03-07 DIAGNOSIS — Z880 Allergy status to penicillin: Secondary | ICD-10-CM | POA: Insufficient documentation

## 2019-03-07 DIAGNOSIS — I132 Hypertensive heart and chronic kidney disease with heart failure and with stage 5 chronic kidney disease, or end stage renal disease: Secondary | ICD-10-CM | POA: Insufficient documentation

## 2019-03-07 DIAGNOSIS — M109 Gout, unspecified: Secondary | ICD-10-CM | POA: Insufficient documentation

## 2019-03-07 DIAGNOSIS — I5022 Chronic systolic (congestive) heart failure: Secondary | ICD-10-CM | POA: Diagnosis present

## 2019-03-07 DIAGNOSIS — R0602 Shortness of breath: Secondary | ICD-10-CM | POA: Diagnosis not present

## 2019-03-07 DIAGNOSIS — Z20828 Contact with and (suspected) exposure to other viral communicable diseases: Secondary | ICD-10-CM | POA: Diagnosis present

## 2019-03-07 DIAGNOSIS — E7800 Pure hypercholesterolemia, unspecified: Secondary | ICD-10-CM | POA: Insufficient documentation

## 2019-03-07 DIAGNOSIS — N250 Renal osteodystrophy: Secondary | ICD-10-CM | POA: Diagnosis present

## 2019-03-07 DIAGNOSIS — Z7951 Long term (current) use of inhaled steroids: Secondary | ICD-10-CM

## 2019-03-07 DIAGNOSIS — N2581 Secondary hyperparathyroidism of renal origin: Secondary | ICD-10-CM | POA: Diagnosis present

## 2019-03-07 DIAGNOSIS — Z886 Allergy status to analgesic agent status: Secondary | ICD-10-CM | POA: Insufficient documentation

## 2019-03-07 DIAGNOSIS — Z8349 Family history of other endocrine, nutritional and metabolic diseases: Secondary | ICD-10-CM

## 2019-03-07 DIAGNOSIS — D500 Iron deficiency anemia secondary to blood loss (chronic): Secondary | ICD-10-CM | POA: Diagnosis not present

## 2019-03-07 DIAGNOSIS — Z881 Allergy status to other antibiotic agents status: Secondary | ICD-10-CM | POA: Insufficient documentation

## 2019-03-07 HISTORY — PX: GIVENS CAPSULE STUDY: SHX5432

## 2019-03-07 SURGERY — IMAGING PROCEDURE, GI TRACT, INTRALUMINAL, VIA CAPSULE

## 2019-03-07 NOTE — ED Triage Notes (Signed)
Pt reports "taking the pill with the camera today." C/o of abdominal pain and rushing to the bathroom and "gushing blood when having a bowel movement." Occurred 1 time about an hour ago.

## 2019-03-07 NOTE — Telephone Encounter (Signed)
Patient had left a voice mail message over night - call was returned - patient stated she had been to hospital for the capsule study - stated she was drunk as a skunk - wanted to know if this feeling was normal - Ann spoke to Wann over in Endo she stated the patient was very sleepy while there.  Please call patient at [phone]

## 2019-03-07 NOTE — Telephone Encounter (Signed)
I called patient, Shawna [name] stated Shawna [name] felt likes Shawna [name] was drunk shortly after Shawna [name] swallowed the Givens capsule. Shawna [name] was lethargic when Shawna [name] got home. Shawna [name] rested for 1 hour and felt better. Today, Shawna [name] is taking it easy, staying home. No longer feels drunk like. Shawna [name] had one black stool this am. I advised pt to go to the ER if Shawna [name] feels drunk or lethargic or if Shawna [name] passes frequent loose black stools. I will forward this msg to Dr. Laural Golden.

## 2019-03-07 NOTE — ED Notes (Signed)
Pt placed behind triage in case she needs to use the bathroom, staff will be able hear pt call. Registration notified of need to listen out.

## 2019-03-07 NOTE — ED Notes (Signed)
Pt reports taking her BP meds this morning but has not taken her night time dose

## 2019-03-07 NOTE — Interval H&P Note (Signed)
Patient's hemoglobin is 8.6 which is closer to her baseline. She is here for small bowel given capsule study looking for source of GI bleed which is suspected to be midgut. History and Physical Interval Note:  03/07/2019 7:21 AM  Bertram Millard Shawna [name]  has presented today for surgery, with the diagnosis of GIB, melena, hx of duodenal and jejunal AVMs on Brilinta and ASA.  The various methods of treatment have been discussed with the patient and family. After consideration of risks, benefits and other options for treatment, the patient has consented to  Procedure(s) with comments: GIVENS CAPSULE STUDY (N/A) - 7:30 as a surgical intervention.  The patient's history has been reviewed, patient examined, no change in status, stable for surgery.  I have reviewed the patient's chart and labs.  Questions were answered to the patient's satisfaction.     [employer]

## 2019-03-08 ENCOUNTER — Emergency Department (HOSPITAL_COMMUNITY): Payer: Medicare HMO

## 2019-03-08 ENCOUNTER — Encounter (HOSPITAL_COMMUNITY): Payer: Self-pay | Admitting: Internal Medicine

## 2019-03-08 DIAGNOSIS — E46 Unspecified protein-calorie malnutrition: Secondary | ICD-10-CM | POA: Diagnosis present

## 2019-03-08 DIAGNOSIS — K2980 Duodenitis without bleeding: Secondary | ICD-10-CM | POA: Diagnosis present

## 2019-03-08 DIAGNOSIS — I480 Paroxysmal atrial fibrillation: Secondary | ICD-10-CM | POA: Diagnosis present

## 2019-03-08 DIAGNOSIS — K5730 Diverticulosis of large intestine without perforation or abscess without bleeding: Secondary | ICD-10-CM | POA: Diagnosis not present

## 2019-03-08 DIAGNOSIS — Z980 Intestinal bypass and anastomosis status: Secondary | ICD-10-CM | POA: Diagnosis not present

## 2019-03-08 DIAGNOSIS — I2510 Atherosclerotic heart disease of native coronary artery without angina pectoris: Secondary | ICD-10-CM | POA: Diagnosis present

## 2019-03-08 DIAGNOSIS — K921 Melena: Secondary | ICD-10-CM | POA: Diagnosis present

## 2019-03-08 DIAGNOSIS — K922 Gastrointestinal hemorrhage, unspecified: Secondary | ICD-10-CM | POA: Diagnosis present

## 2019-03-08 DIAGNOSIS — E785 Hyperlipidemia, unspecified: Secondary | ICD-10-CM | POA: Diagnosis present

## 2019-03-08 DIAGNOSIS — F419 Anxiety disorder, unspecified: Secondary | ICD-10-CM | POA: Diagnosis present

## 2019-03-08 DIAGNOSIS — I252 Old myocardial infarction: Secondary | ICD-10-CM | POA: Diagnosis not present

## 2019-03-08 DIAGNOSIS — D649 Anemia, unspecified: Secondary | ICD-10-CM

## 2019-03-08 DIAGNOSIS — N186 End stage renal disease: Secondary | ICD-10-CM | POA: Diagnosis present

## 2019-03-08 DIAGNOSIS — D122 Benign neoplasm of ascending colon: Secondary | ICD-10-CM | POA: Diagnosis not present

## 2019-03-08 DIAGNOSIS — K5750 Diverticulosis of both small and large intestine without perforation or abscess without bleeding: Secondary | ICD-10-CM | POA: Diagnosis present

## 2019-03-08 DIAGNOSIS — I5032 Chronic diastolic (congestive) heart failure: Secondary | ICD-10-CM | POA: Diagnosis present

## 2019-03-08 DIAGNOSIS — K635 Polyp of colon: Secondary | ICD-10-CM | POA: Diagnosis present

## 2019-03-08 DIAGNOSIS — Z20828 Contact with and (suspected) exposure to other viral communicable diseases: Secondary | ICD-10-CM | POA: Diagnosis present

## 2019-03-08 DIAGNOSIS — J9601 Acute respiratory failure with hypoxia: Secondary | ICD-10-CM | POA: Diagnosis present

## 2019-03-08 DIAGNOSIS — I471 Supraventricular tachycardia: Secondary | ICD-10-CM

## 2019-03-08 DIAGNOSIS — K2990 Gastroduodenitis, unspecified, without bleeding: Secondary | ICD-10-CM | POA: Diagnosis present

## 2019-03-08 DIAGNOSIS — L299 Pruritus, unspecified: Secondary | ICD-10-CM | POA: Diagnosis present

## 2019-03-08 DIAGNOSIS — Z951 Presence of aortocoronary bypass graft: Secondary | ICD-10-CM | POA: Diagnosis not present

## 2019-03-08 DIAGNOSIS — R739 Hyperglycemia, unspecified: Secondary | ICD-10-CM | POA: Diagnosis present

## 2019-03-08 DIAGNOSIS — Z992 Dependence on renal dialysis: Secondary | ICD-10-CM

## 2019-03-08 DIAGNOSIS — I132 Hypertensive heart and chronic kidney disease with heart failure and with stage 5 chronic kidney disease, or end stage renal disease: Secondary | ICD-10-CM | POA: Diagnosis present

## 2019-03-08 DIAGNOSIS — K210 Gastro-esophageal reflux disease with esophagitis: Secondary | ICD-10-CM | POA: Diagnosis present

## 2019-03-08 DIAGNOSIS — R0602 Shortness of breath: Secondary | ICD-10-CM | POA: Diagnosis present

## 2019-03-08 DIAGNOSIS — D62 Acute posthemorrhagic anemia: Secondary | ICD-10-CM | POA: Diagnosis present

## 2019-03-08 DIAGNOSIS — D123 Benign neoplasm of transverse colon: Secondary | ICD-10-CM | POA: Diagnosis not present

## 2019-03-08 DIAGNOSIS — N250 Renal osteodystrophy: Secondary | ICD-10-CM | POA: Diagnosis present

## 2019-03-08 DIAGNOSIS — I10 Essential (primary) hypertension: Secondary | ICD-10-CM

## 2019-03-08 LAB — CBC
HCT: 23.2 % — ABNORMAL LOW (ref 36.0–46.0)
Hemoglobin: 6.8 g/dL — CL (ref 12.0–15.0)
MCH: 33.7 pg (ref 26.0–34.0)
MCHC: 29.3 g/dL — ABNORMAL LOW (ref 30.0–36.0)
MCV: 114.9 fL — ABNORMAL HIGH (ref 80.0–100.0)
Platelets: 216 10*3/uL (ref 150–400)
RBC: 2.02 MIL/uL — ABNORMAL LOW (ref 3.87–5.11)
RDW: 17.7 % — ABNORMAL HIGH (ref 11.5–15.5)
WBC: 7.1 10*3/uL (ref 4.0–10.5)
nRBC: 0 % (ref 0.0–0.2)

## 2019-03-08 LAB — CBC WITH DIFFERENTIAL/PLATELET
Abs Immature Granulocytes: 0.02 10*3/uL (ref 0.00–0.07)
Abs Immature Granulocytes: 0.52 10*3/uL — ABNORMAL HIGH (ref 0.00–0.07)
Basophils Absolute: 0 10*3/uL (ref 0.0–0.1)
Basophils Absolute: 0 10*3/uL (ref 0.0–0.1)
Basophils Relative: 0 %
Basophils Relative: 0 %
Eosinophils Absolute: 0.1 10*3/uL (ref 0.0–0.5)
Eosinophils Absolute: 0.2 10*3/uL (ref 0.0–0.5)
Eosinophils Relative: 1 %
Eosinophils Relative: 3 %
HCT: 23.5 % — ABNORMAL LOW (ref 36.0–46.0)
HCT: 24 % — ABNORMAL LOW (ref 36.0–46.0)
Hemoglobin: 7.3 g/dL — ABNORMAL LOW (ref 12.0–15.0)
Hemoglobin: 7.5 g/dL — ABNORMAL LOW (ref 12.0–15.0)
Immature Granulocytes: 0 %
Immature Granulocytes: 5 %
Lymphocytes Relative: 5 %
Lymphocytes Relative: 9 %
Lymphs Abs: 0.6 10*3/uL — ABNORMAL LOW (ref 0.7–4.0)
Lymphs Abs: 0.7 10*3/uL (ref 0.7–4.0)
MCH: 34.4 pg — ABNORMAL HIGH (ref 26.0–34.0)
MCH: 34.8 pg — ABNORMAL HIGH (ref 26.0–34.0)
MCHC: 30.4 g/dL (ref 30.0–36.0)
MCHC: 31.9 g/dL (ref 30.0–36.0)
MCV: 107.8 fL — ABNORMAL HIGH (ref 80.0–100.0)
MCV: 114.3 fL — ABNORMAL HIGH (ref 80.0–100.0)
Monocytes Absolute: 0.8 10*3/uL (ref 0.1–1.0)
Monocytes Absolute: 0.9 10*3/uL (ref 0.1–1.0)
Monocytes Relative: 11 %
Monocytes Relative: 8 %
Neutro Abs: 5.9 10*3/uL (ref 1.7–7.7)
Neutro Abs: 9.5 10*3/uL — ABNORMAL HIGH (ref 1.7–7.7)
Neutrophils Relative %: 77 %
Neutrophils Relative %: 81 %
Platelets: 215 10*3/uL (ref 150–400)
Platelets: 223 10*3/uL (ref 150–400)
RBC: 2.1 MIL/uL — ABNORMAL LOW (ref 3.87–5.11)
RBC: 2.18 MIL/uL — ABNORMAL LOW (ref 3.87–5.11)
RDW: 17.7 % — ABNORMAL HIGH (ref 11.5–15.5)
RDW: 17.9 % — ABNORMAL HIGH (ref 11.5–15.5)
WBC: 11.7 10*3/uL — ABNORMAL HIGH (ref 4.0–10.5)
WBC: 7.6 10*3/uL (ref 4.0–10.5)
nRBC: 0 % (ref 0.0–0.2)
nRBC: 0.3 % — ABNORMAL HIGH (ref 0.0–0.2)

## 2019-03-08 LAB — COMPREHENSIVE METABOLIC PANEL
ALT: 8 U/L (ref 0–44)
ALT: 9 U/L (ref 0–44)
AST: 9 U/L — ABNORMAL LOW (ref 15–41)
AST: 9 U/L — ABNORMAL LOW (ref 15–41)
Albumin: 3 g/dL — ABNORMAL LOW (ref 3.5–5.0)
Albumin: 3.4 g/dL — ABNORMAL LOW (ref 3.5–5.0)
Alkaline Phosphatase: 54 U/L (ref 38–126)
Alkaline Phosphatase: 59 U/L (ref 38–126)
Anion gap: 13 (ref 5–15)
Anion gap: 14 (ref 5–15)
BUN: 35 mg/dL — ABNORMAL HIGH (ref 8–23)
BUN: 39 mg/dL — ABNORMAL HIGH (ref 8–23)
CO2: 27 mmol/L (ref 22–32)
CO2: 28 mmol/L (ref 22–32)
Calcium: 9 mg/dL (ref 8.9–10.3)
Calcium: 9.3 mg/dL (ref 8.9–10.3)
Chloride: 100 mmol/L (ref 98–111)
Chloride: 103 mmol/L (ref 98–111)
Creatinine, Ser: 6.89 mg/dL — ABNORMAL HIGH (ref 0.44–1.00)
Creatinine, Ser: 7.99 mg/dL — ABNORMAL HIGH (ref 0.44–1.00)
GFR calc Af Amer: 5 mL/min — ABNORMAL LOW (ref >60)
GFR calc Af Amer: 6 mL/min — ABNORMAL LOW (ref >60)
GFR calc non Af Amer: 4 mL/min — ABNORMAL LOW (ref >60)
GFR calc non Af Amer: 5 mL/min — ABNORMAL LOW (ref >60)
Glucose, Bld: 103 mg/dL — ABNORMAL HIGH (ref 70–99)
Glucose, Bld: 129 mg/dL — ABNORMAL HIGH (ref 70–99)
Potassium: 4.2 mmol/L (ref 3.5–5.1)
Potassium: 4.8 mmol/L (ref 3.5–5.1)
Sodium: 142 mmol/L (ref 135–145)
Sodium: 143 mmol/L (ref 135–145)
Total Bilirubin: 0.5 mg/dL (ref 0.3–1.2)
Total Bilirubin: 0.5 mg/dL (ref 0.3–1.2)
Total Protein: 5.5 g/dL — ABNORMAL LOW (ref 6.5–8.1)
Total Protein: 6.1 g/dL — ABNORMAL LOW (ref 6.5–8.1)

## 2019-03-08 LAB — PROTIME-INR
INR: 1.1 (ref 0.8–1.2)
INR: 1.1 (ref 0.8–1.2)
Prothrombin Time: 13.7 seconds (ref 11.4–15.2)
Prothrombin Time: 14.3 seconds (ref 11.4–15.2)

## 2019-03-08 LAB — RENAL FUNCTION PANEL
Albumin: 3 g/dL — ABNORMAL LOW (ref 3.5–5.0)
Anion gap: 13 (ref 5–15)
BUN: 41 mg/dL — ABNORMAL HIGH (ref 8–23)
CO2: 27 mmol/L (ref 22–32)
Calcium: 9 mg/dL (ref 8.9–10.3)
Chloride: 104 mmol/L (ref 98–111)
Creatinine, Ser: 7.98 mg/dL — ABNORMAL HIGH (ref 0.44–1.00)
GFR calc Af Amer: 5 mL/min — ABNORMAL LOW (ref >60)
GFR calc non Af Amer: 4 mL/min — ABNORMAL LOW (ref >60)
Glucose, Bld: 103 mg/dL — ABNORMAL HIGH (ref 70–99)
Phosphorus: 5.9 mg/dL — ABNORMAL HIGH (ref 2.5–4.6)
Potassium: 4.8 mmol/L (ref 3.5–5.1)
Sodium: 144 mmol/L (ref 135–145)

## 2019-03-08 LAB — FIBRINOGEN: Fibrinogen: 481 mg/dL — ABNORMAL HIGH (ref 210–475)

## 2019-03-08 LAB — HEMOGLOBIN AND HEMATOCRIT, BLOOD
HCT: 38.3 % (ref 36.0–46.0)
Hemoglobin: 12.1 g/dL (ref 12.0–15.0)

## 2019-03-08 LAB — BLOOD GAS, ARTERIAL
Acid-base deficit: 0.1 mmol/L (ref 0.0–2.0)
Bicarbonate: 23.9 mmol/L (ref 20.0–28.0)
Drawn by: 22223
FIO2: 32
O2 Saturation: 87.8 %
Patient temperature: 37
pCO2 arterial: 49.4 mmHg — ABNORMAL HIGH (ref 32.0–48.0)
pH, Arterial: 7.326 — ABNORMAL LOW (ref 7.350–7.450)
pO2, Arterial: 67.3 mmHg — ABNORMAL LOW (ref 83.0–108.0)

## 2019-03-08 LAB — APTT: aPTT: 32 seconds (ref 24–36)

## 2019-03-08 LAB — SARS CORONAVIRUS 2 BY RT PCR (HOSPITAL ORDER, PERFORMED IN ~~LOC~~ HOSPITAL LAB): SARS Coronavirus 2: NEGATIVE

## 2019-03-08 LAB — MRSA PCR SCREENING: MRSA by PCR: NEGATIVE

## 2019-03-08 LAB — PREPARE RBC (CROSSMATCH)

## 2019-03-08 IMAGING — CR PORTABLE CHEST - 1 VIEW
1 series · 1 of 1 positions shown · non-contrast
Comparison: [DATE]

CLINICAL DATA: Lower GI bleed

EXAM:
PORTABLE CHEST 1 VIEW

[portable]
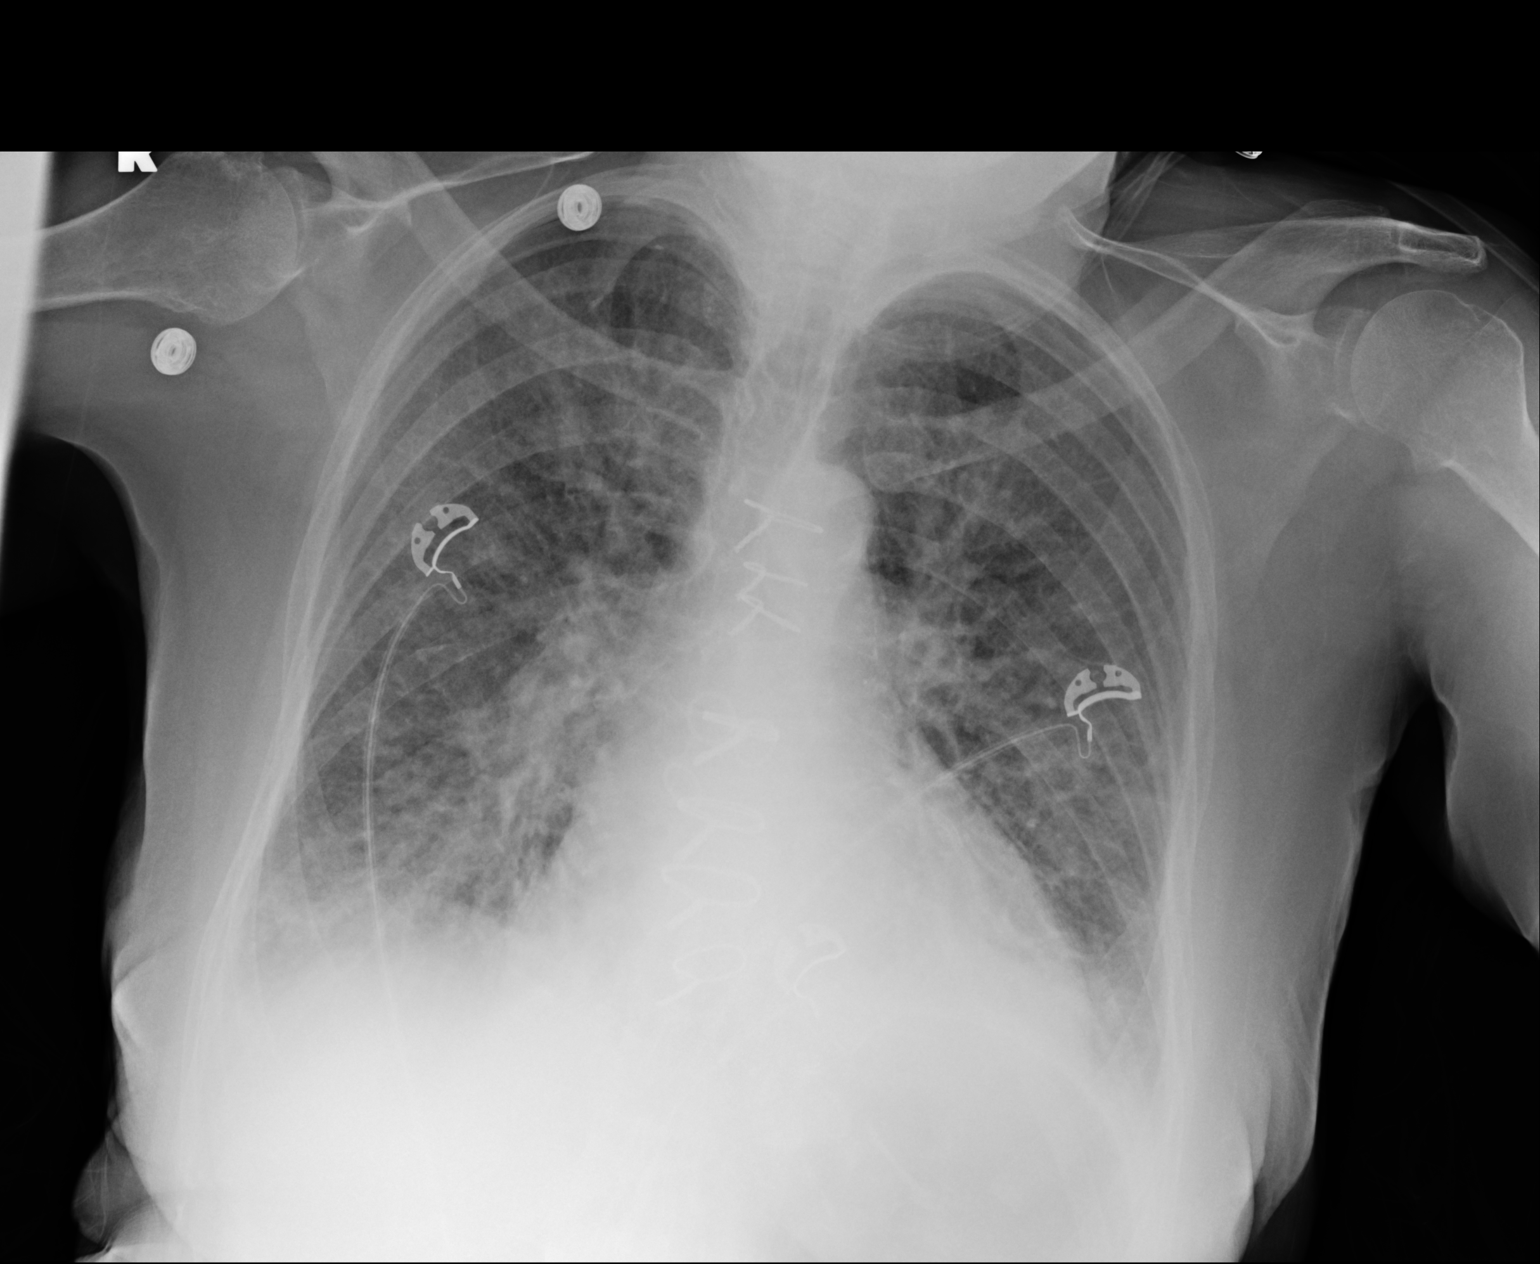

[1 of 1 positions shown; findings below may reference images not displayed]

FINDINGS: Small bilateral pleural effusions. Cardiomegaly with vascular
congestion and diffuse interstitial opacity compatible with edema.
Basilar consolidations. No pneumothorax. Post sternotomy changes.
IMPRESSION: 1. Cardiomegaly with worsened vascular congestion and increased
interstitial edema. Interim development of small bilateral pleural
effusions and basilar atelectasis or pneumonia

## 2019-03-08 MED ORDER — ALPRAZOLAM 0.5 MG PO TABS
0.5000 mg | ORAL_TABLET | Freq: Every day | ORAL | Status: DC
  Administered 2019-03-08 – 2019-03-10 (×3): 0.5 mg via ORAL
  Filled 2019-03-08 (×4): qty 1

## 2019-03-08 MED ORDER — SIMVASTATIN 20 MG PO TABS
40.0000 mg | ORAL_TABLET | Freq: Every day | ORAL | Status: DC
  Administered 2019-03-09: 40 mg via ORAL
  Filled 2019-03-08 (×2): qty 2

## 2019-03-08 MED ORDER — ALTEPLASE 2 MG IJ SOLR: 2.0000 mg | Freq: Once | INTRAMUSCULAR | Status: DC | PRN

## 2019-03-08 MED ORDER — SODIUM CHLORIDE 0.9 % IV SOLN: 100.0000 mL | INTRAVENOUS | Status: DC | PRN

## 2019-03-08 MED ORDER — LIDOCAINE-PRILOCAINE 2.5-2.5 % EX CREA
1.0000 "application " | TOPICAL_CREAM | CUTANEOUS | Status: DC | PRN
  Filled 2019-03-08: qty 5

## 2019-03-08 MED ORDER — BISACODYL 5 MG PO TBEC
10.0000 mg | DELAYED_RELEASE_TABLET | Freq: Once | ORAL | Status: AC
  Administered 2019-03-08: 10 mg via ORAL
  Filled 2019-03-08: qty 2

## 2019-03-08 MED ORDER — LORATADINE 10 MG PO TABS
10.0000 mg | ORAL_TABLET | Freq: Every day | ORAL | Status: DC | PRN
  Administered 2019-03-08: 10 mg via ORAL
  Filled 2019-03-08: qty 1

## 2019-03-08 MED ORDER — PEG 3350-KCL-NA BICARB-NACL 420 G PO SOLR
4000.0000 mL | Freq: Once | ORAL | Status: AC
  Administered 2019-03-09: 4000 mL via ORAL

## 2019-03-08 MED ORDER — NITROGLYCERIN 0.4 MG SL SUBL
0.4000 mg | SUBLINGUAL_TABLET | Freq: Once | SUBLINGUAL | Status: AC
  Administered 2019-03-08: 0.4 mg via SUBLINGUAL

## 2019-03-08 MED ORDER — NITROGLYCERIN 0.4 MG SL SUBL
SUBLINGUAL_TABLET | SUBLINGUAL | Status: AC
  Filled 2019-03-08: qty 1

## 2019-03-08 MED ORDER — HEPARIN SODIUM (PORCINE) 1000 UNIT/ML DIALYSIS: 1000.0000 [IU] | INTRAMUSCULAR | Status: DC | PRN

## 2019-03-08 MED ORDER — ONDANSETRON HCL 4 MG/2ML IJ SOLN: 4.0000 mg | Freq: Four times a day (QID) | INTRAMUSCULAR | Status: DC | PRN

## 2019-03-08 MED ORDER — ONDANSETRON HCL 4 MG PO TABS: 4.0000 mg | ORAL_TABLET | Freq: Four times a day (QID) | ORAL | Status: DC | PRN

## 2019-03-08 MED ORDER — LIDOCAINE HCL (PF) 1 % IJ SOLN: 5.0000 mL | INTRAMUSCULAR | Status: DC | PRN

## 2019-03-08 MED ORDER — CARVEDILOL 12.5 MG PO TABS
12.5000 mg | ORAL_TABLET | Freq: Two times a day (BID) | ORAL | Status: DC
  Administered 2019-03-08 – 2019-03-10 (×4): 12.5 mg via ORAL
  Filled 2019-03-08 (×4): qty 1

## 2019-03-08 MED ORDER — FLUTICASONE PROPIONATE 50 MCG/ACT NA SUSP: 1.0000 | Freq: Every evening | NASAL | Status: DC | PRN

## 2019-03-08 MED ORDER — PANTOPRAZOLE SODIUM 40 MG IV SOLR
40.0000 mg | Freq: Once | INTRAVENOUS | Status: AC
  Administered 2019-03-08: 40 mg via INTRAVENOUS
  Filled 2019-03-08: qty 40

## 2019-03-08 MED ORDER — RENA-VITE PO TABS
1.0000 | ORAL_TABLET | Freq: Every day | ORAL | Status: DC
  Administered 2019-03-08 – 2019-03-10 (×3): 1 via ORAL
  Filled 2019-03-08 (×3): qty 1

## 2019-03-08 MED ORDER — PENTAFLUOROPROP-TETRAFLUOROETH EX AERO: 1.0000 "application " | INHALATION_SPRAY | CUTANEOUS | Status: DC | PRN

## 2019-03-08 MED ORDER — DIPHENHYDRAMINE HCL 50 MG/ML IJ SOLN
12.5000 mg | Freq: Four times a day (QID) | INTRAMUSCULAR | Status: DC | PRN
  Administered 2019-03-09: 12.5 mg via INTRAVENOUS
  Filled 2019-03-08: qty 1

## 2019-03-08 MED ORDER — SODIUM CHLORIDE 0.9% IV SOLUTION: Freq: Once | INTRAVENOUS | Status: DC

## 2019-03-08 MED ORDER — ALBUTEROL SULFATE (2.5 MG/3ML) 0.083% IN NEBU: 2.5000 mg | INHALATION_SOLUTION | RESPIRATORY_TRACT | Status: DC | PRN

## 2019-03-08 MED ORDER — AMIODARONE HCL 200 MG PO TABS
200.0000 mg | ORAL_TABLET | Freq: Every day | ORAL | Status: DC
  Administered 2019-03-08 – 2019-03-10 (×3): 200 mg via ORAL
  Filled 2019-03-08 (×3): qty 1

## 2019-03-08 MED ORDER — NITROGLYCERIN IN D5W 200-5 MCG/ML-% IV SOLN
0.0000 ug/min | INTRAVENOUS | Status: DC
  Administered 2019-03-08: 5 ug/min via INTRAVENOUS
  Filled 2019-03-08: qty 250

## 2019-03-08 MED ORDER — LIDOCAINE-PRILOCAINE 2.5-2.5 % EX CREA: 1.0000 "application " | TOPICAL_CREAM | CUTANEOUS | Status: DC

## 2019-03-08 MED ORDER — ALBUTEROL SULFATE HFA 108 (90 BASE) MCG/ACT IN AERS
2.0000 | INHALATION_SPRAY | RESPIRATORY_TRACT | Status: DC | PRN
  Filled 2019-03-08: qty 6.7

## 2019-03-08 MED ORDER — PEG 3350-KCL-NA BICARB-NACL 420 G PO SOLR: 4000.0000 mL | Freq: Once | ORAL | Status: DC

## 2019-03-08 MED ORDER — CHLORHEXIDINE GLUCONATE CLOTH 2 % EX PADS
6.0000 | MEDICATED_PAD | Freq: Every day | CUTANEOUS | Status: DC
  Administered 2019-03-08 – 2019-03-09 (×3): 6 via TOPICAL

## 2019-03-08 NOTE — H&P (Signed)
TRH H&P    Patient Demographics:    Shawna [name], is a 78 y.o. female  MRN: [id number]  DOB - [birth year]  Admit Date - 03/07/2019  Referring MD/NP/PA: Dr. Betsey Holiday   Outpatient Primary MD for the patient is [name], MD  Patient coming from: Home  Chief complaint-GI bleed   HPI:    Shawna [name]  is a 78 y.o. female, with history of acute on chronic respiratory failure with hypoxia, anxiety, arthritis, coronary artery disease status post CABG, chronic diastolic CHF last echo January 13, 2019 ejection fraction 60 to 65%, HLD, colon cancer, esophageal stricture, ESRD on HD, and GERD presents to the ED today with a complaint of blood per rectum.  Patient reports that she has had problems with ongoing bleeding for the past couple of weeks.  She reports that today she took a pill for capsule endoscopy.  This is under the care of Dr. Laural Golden.  She notes that she started feeling weak later that day.  There is indeed a telephone encounter in epic saying that she called him saying that she felt drunk.  This progressed to occasional cramping of the abdomen.  That progressed further to blood per rectum.  She reports that she passed gushing amounts of blood into the toilet without any stool mixed in.  At the time of arrival she had no associated chest pain palpitations or shortness of breath.  Patient had called into office and was advised to come into the ED.  She still complained of no abdominal pain by the time of my exam.  ED course Patient had blood work that showed normal electrolytes, and a BUN and creatinine that were elevated.  She also had hyperglycemia at 129.  She had a anemia at 7.3.  Minimally elevated fibrinogen at 481.  INR 1.1.  Type and screen was ordered.  She did have a CT scan of her abdomen and pelvis on March 02, 2019 it showed: 1.  Interval development of interstitial and basilar airspace opacity in both lungs.   Basilar infection/inflammation a consideration. 2.  Bilateral lesions of varying size and attenuation again noted in the atrophic kidneys.  The 3 x 2.3 cm heterogeneous lesion in question involving the upper pole right kidney is stable.  While this cannot be definitively characterized, is not substantially changed in 1 year since 12/28/2017.  Continued attention on follow-up recommended. 3.  Diffuse diverticulosis without diverticulitis. 4.  Aortic atherosclerosis. As for imaging today she did have a chest x-ray that showed 1.  Cardiomegaly with worsened vascular congestion and increased interstitial edema.  Interim development of small bilateral pleural effusions and basilar atelectasis or pneumonia.  ED initially called me for GI bleed and asymptomatic anemia.  Upon my first exam of patient she was demonstrating labored breathing.  Calling out "help me Jesus."  This is when the chest x-ray was called for.  Patient was started on nitro drip to address flash pulmonary edema -confirmed by increased vascular congestion on chest x-ray.  At that time her COVID test was not  back yet so she cannot have a BiPAP.  Patient's systolic blood pressure was about 190/100 at that time.  After the nitro drip patient's blood pressure hovered around the 140s.  An ABG was done at that time that showed a pH 7.326, PCO2 49.4, PO2 67.3.  Patient stabilized after BiPAP was able to be applied.  She is COVID negative.    Review of systems:    In addition to the HPI above,  No Fever-chills, No Headache, No changes with Vision or hearing, No problems swallowing food or Liquids, No Chest pain, Cough or Shortness of Breath, prior to incident in the ED No Abdominal pain, patient has experienced nausea today after the capsule study, and blood per rectum  Patient does not make urine No new skin rashes or bruises, No new joints pains-aches,  Patient does complain of weakness today that is generalized and not asymmetric.  No  numbness no tingling. No recent weight gain or loss, No polyuria, polydypsia or polyphagia, No significant Mental Stressors.  All other systems reviewed and are negative.    Past History of the following :    Past Medical History:  Diagnosis Date  . Acute on chronic respiratory failure with hypoxia ([place]) 10/10/2016  . Anxiety   . Arthritis   . AVM (arteriovenous malformation) of colon   . CAD (coronary artery disease)    a. s/p CABG in 2013 b. most recent DES to D1 in 10/2016 with cath showing patent LIMA-LAD, SVG-RI, SVG-Mrg and SVG-RCA  c. NST in 02/2018 showing scar with no significant ischemia  . Carotid artery disease ([place])    a. 58-09% LICA, 03/8337   . Chronic bronchitis ([place])   . Chronic diastolic CHF (congestive heart failure) ([place])    a. 02/2012 Echo EF 60-65%, nl wall motion, Gr 1 DD, mod MR  . Colon cancer ([place]) 1992  . Esophageal stricture   . ESRD on hemodialysis ([place])    ESRD due to HTN, started dialysis 2011 and gets HD at [facility] with Dr Hinda Lenis on MWF schedule.  Access is LUA AVF as of Sept 2014.   Marland Kitchen GERD (gastroesophageal reflux disease)   . High cholesterol 12/2011  . History of blood transfusion 07/2011; 12/2011; 01/2012 X 2; 04/2012  . History of gout   . History of lower GI bleeding   . Hypertension   . Iron deficiency anemia   . Mitral regurgitation    a. Moderate by echo, 02/2012  . Myocardial infarction ([place])   . Ovarian cancer ([place]) 1992  . Pneumonia ~ 2009  . PUD (peptic ulcer disease)   . TIA (transient ischemic attack)       Past Surgical History:  Procedure Laterality Date  . ABDOMINAL HYSTERECTOMY  1992  . APPENDECTOMY  06/1990  . AV FISTULA PLACEMENT  07/2009   left upper arm  . AV FISTULA PLACEMENT Right 09/06/2016   Procedure: RIGHT FOREARM ARTERIOVENOUS (AV) GRAFT;  Surgeon: Elam Dutch, MD;  Location: [facility] OR;  Service: Vascular;  Laterality: Right;  . AV FISTULA PLACEMENT N/A 02/24/2017   Procedure: INSERTION OF ARTERIOVENOUS  (AV) GORE-TEX GRAFT ARM (BRACHIAL AXILLARY);  Surgeon: Katha Cabal, MD;  Location: [facility] ORS;  Service: Vascular;  Laterality: N/A;  . [place] Right 09/06/2016   Procedure: REMOVAL OF Right Arm ARTERIOVENOUS GORETEX GRAFT and Vein Patch angioplasty of brachial artery;  Surgeon: Angelia Mould, MD;  Location: [place];  Service: Vascular;  Laterality: Right;  . COLON RESECTION  [place]    . CORONARY ANGIOPLASTY WITH STENT PLACEMENT  12/15/11   "2"  . CORONARY ANGIOPLASTY WITH STENT PLACEMENT  y/2013   "1; makes total of 3" (05/02/2012)  . CORONARY ARTERY BYPASS GRAFT  06/13/2012   Procedure: CORONARY ARTERY BYPASS GRAFTING (CABG);  Surgeon: Grace Isaac, MD;  Location: [place];  Service: Open Heart Surgery;  Laterality: N/A;  cabg x four;  using left internal mammary artery, and left leg greater saphenous vein harvested endoscopically  . CORONARY STENT INTERVENTION N/A 10/13/2016   Procedure: Coronary Stent Intervention;  Surgeon: Troy Sine, MD;  Location: [place] CV LAB;  Service: Cardiovascular;  Laterality: N/A;  . DIALYSIS/PERMA CATHETER REMOVAL N/A 04/18/2017   Procedure: DIALYSIS/PERMA CATHETER REMOVAL;  Surgeon: Katha Cabal, MD;  Location: [place] CV LAB;  Service: Cardiovascular;  Laterality: N/A;  . DILATION AND CURETTAGE OF UTERUS    . ESOPHAGOGASTRODUODENOSCOPY  01/20/2012   Procedure: ESOPHAGOGASTRODUODENOSCOPY (EGD);  Surgeon: Ladene Artist, MD,FACG;  Location[facility] ENDOSCOPY;  Service: Endoscopy;  Laterality: N/A;  . ESOPHAGOGASTRODUODENOSCOPY N/A 03/26/2013   Procedure: ESOPHAGOGASTRODUODENOSCOPY (EGD);  Surgeon: Irene Shipper, MD;  Location: [facility] ENDOSCOPY;  Service: Endoscopy;  Laterality: N/A;  . ESOPHAGOGASTRODUODENOSCOPY N/A 04/30/2015   Procedure: ESOPHAGOGASTRODUODENOSCOPY (EGD);  Surgeon: Rogene [place], MD;  [facility];  Service: Endoscopy;  Laterality: N/A;  1pm - moved to 10/20 @ 1:10  . ESOPHAGOGASTRODUODENOSCOPY  N/A 07/29/2016   Procedure: ESOPHAGOGASTRODUODENOSCOPY (EGD);  Surgeon: Manus Gunning, MD;  Location: [place];  Service: Gastroenterology;  Laterality: N/A;  enteroscopy  . INTRAOPERATIVE TRANSESOPHAGEAL ECHOCARDIOGRAM  06/13/2012   Procedure: INTRAOPERATIVE TRANSESOPHAGEAL ECHOCARDIOGRAM;  Surgeon: Grace Isaac, MD;  Location: [place];  Service: Open Heart Surgery;  Laterality: N/A;  . IR GENERIC HISTORICAL  07/26/2016   IR FLUORO GUIDE CV LINE RIGHT 07/26/2016 Greggory Keen, MD [facility]  . IR GENERIC HISTORICAL  07/26/2016   IR US GUIDE VASC ACCESS RIGHT 07/26/2016 Greggory Keen, MD [facility]  . IR GENERIC HISTORICAL  08/02/2016   IR US GUIDE VASC ACCESS RIGHT 08/02/2016 Greggory Keen, MD [facility]  . IR GENERIC HISTORICAL  08/02/2016   IR FLUORO GUIDE CV LINE RIGHT 08/02/2016 Greggory Keen, MD [facility]  . IR RADIOLOGY PERIPHERAL GUIDED IV START  03/28/2017  . IR US GUIDE VASC ACCESS RIGHT  03/28/2017  . LEFT HEART CATH AND CORONARY ANGIOGRAPHY N/A 09/20/2016   Procedure: Left Heart Cath and Coronary Angiography;  Surgeon: [name], MD;  Location: [place] CV LAB;  Service: Cardiovascular;  Laterality: N/A;  . LEFT HEART CATH AND CORS/GRAFTS ANGIOGRAPHY N/A 10/13/2016   Procedure: Left Heart Cath and Cors/Grafts Angiography;  Surgeon: Troy Sine, MD;  Location: [place] [facility] LAB;  Service: Cardiovascular;  Laterality: N/A;  . LEFT HEART CATH AND CORS/GRAFTS ANGIOGRAPHY N/A 07/13/2018   Procedure: LEFT HEART CATH AND CORS/GRAFTS ANGIOGRAPHY;  Surgeon: Martinique, Peter M, MD;  Location: [place] CV LAB;  Service: Cardiovascular;  Laterality: N/A;  . LEFT HEART CATHETERIZATION WITH CORONARY ANGIOGRAM N/A 12/15/2011   Procedure: LEFT HEART CATHETERIZATION WITH CORONARY ANGIOGRAM;  Surgeon: Burnell Blanks, MD;  Location: [facility] CATH LAB;  Service: Cardiovascular;  Laterality: N/A;  . LEFT HEART CATHETERIZATION WITH CORONARY ANGIOGRAM N/A 01/10/2012   Procedure: LEFT  HEART CATHETERIZATION WITH CORONARY ANGIOGRAM;  Surgeon: Peter M Martinique, MD;  Location: [facility] CATH LAB;  Service: Cardiovascular;  Laterality: N/A;  . LEFT HEART CATHETERIZATION WITH  CORONARY ANGIOGRAM N/A 06/08/2012   Procedure: LEFT HEART CATHETERIZATION WITH CORONARY ANGIOGRAM;  Surgeon: Burnell Blanks, MD;  Location: [facility] CATH LAB;  Service: Cardiovascular;  Laterality: N/A;  . LEFT HEART CATHETERIZATION WITH CORONARY/GRAFT ANGIOGRAM N/A 12/10/2013   Procedure: LEFT HEART CATHETERIZATION WITH Beatrix Fetters;  Surgeon: [name], MD;  Location: [facility] CATH LAB;  Service: Cardiovascular;  Laterality: N/A;  . OVARY SURGERY     ovarian cancer  . REVISION OF ARTERIOVENOUS GORETEX GRAFT N/A 02/24/2017   Procedure: REVISION OF ARTERIOVENOUS GORETEX GRAFT (RESECTION);  Surgeon: Katha Cabal, MD;  Location: [facility] ORS;  Service: Vascular;  Laterality: N/A;  Earney Mallet N/A 10/15/2013   Procedure: Fistulogram;  Surgeon: Serafina Mitchell, MD;  Location: [facility] CATH LAB;  Service: Cardiovascular;  Laterality: N/A;  . THROMBECTOMY / ARTERIOVENOUS GRAFT REVISION  2011   left upper arm  . TUBAL LIGATION  1980's  . UPPER EXTREMITY ANGIOGRAPHY Bilateral 12/06/2016   Procedure: Upper Extremity Angiography;  Surgeon: Katha Cabal, MD;  Location: [place] CV LAB;  Service: Cardiovascular;  Laterality: Bilateral;  . UPPER EXTREMITY INTERVENTION Left 06/06/2017   Procedure: UPPER EXTREMITY INTERVENTION;  Surgeon: Katha Cabal, MD;  Location: [place] CV LAB;  Service: Cardiovascular;  Laterality: Left;      Social History:      Social History   Tobacco Use  . Smoking status: Never Smoker  . Smokeless tobacco: Never Used  Substance Use Topics  . Alcohol use: No    Alcohol/week: 0.0 standard drinks       Family History :     Family History  Problem Relation Age of Onset  . Heart disease Mother        Heart Disease before age 62  . Hyperlipidemia Mother   .  Hypertension Mother   . Diabetes Mother   . Heart attack Mother   . Heart disease Father        Heart Disease before age 34  . Hyperlipidemia Father   . Hypertension Father   . Diabetes Father   . Diabetes Sister   . Hypertension Sister   . Diabetes Brother   . Hyperlipidemia Brother   . Heart attack Brother   . Hypertension Sister   . Heart attack Brother   . Other Other        noncontributory for early CAD  . Colon cancer Neg Hx   . Esophageal cancer Neg Hx   . Liver disease Neg Hx   . Kidney disease Neg Hx   . Colon polyps Neg Hx       Home Medications:   Prior to Admission medications   Medication Sig Start Date End Date Taking? Authorizing Provider  ALPRAZolam Duanne Moron) 0.5 MG tablet Take 0.5 mg by mouth daily. 10/16/18   [provider]  amiodarone (PACERONE) 200 MG tablet Take 1 tablet (200 mg total) by mouth daily. 02/19/19   Herminio Commons, MD  aspirin EC 81 MG tablet Take 81 mg by mouth daily.    [provider]  carvedilol (COREG) 12.5 MG tablet Take 1 tablet (12.5 mg total) by mouth 2 (two) times daily with a meal for 30 days. Additional refills per PCP 12/27/18 01/26/19  Donne Hazel, MD  cloNIDine (CATAPRES) 0.1 MG tablet Take 1 tablet (0.1 mg total) by mouth 2 (two) times daily. 02/01/19   Daune Perch, NP  diphenhydrAMINE (BENADRYL) 25 mg capsule Take 50 mg by mouth daily as needed for itching  or allergies.     [provider]  fluticasone (FLONASE) 50 MCG/ACT nasal spray Place 1 spray at bedtime as needed into both nostrils for allergies.     [provider]  guaiFENesin-codeine (ROBITUSSIN AC) 100-10 MG/5ML syrup Take 5 mLs by mouth 3 (three) times daily as needed for cough. 02/05/19   Virgel Manifold, MD  isosorbide mononitrate (IMDUR) 60 MG 24 hr tablet Take 1 tablet (60 mg total) by mouth 2 (two) times a day for 30 days. 12/15/18 03/02/19  Heath Lark D, DO  lidocaine-prilocaine (EMLA) cream Apply 1 application topically  every Monday, Wednesday, and Friday. Prior to dialysis    [provider]  loratadine (CLARITIN) 10 MG tablet Take 1 tablet (10 mg total) by mouth daily as needed for allergies. 07/14/18   Hosie Poisson, MD  multivitamin (RENA-VIT) TABS tablet Take 1 tablet by mouth daily.    [provider]  nitroGLYCERIN (NITROSTAT) 0.4 MG SL tablet DISSOLVE ONE TABLET UNDER THE TONGUE EVERY 5 MINUTES AS NEEDED FOR CHEST PAIN.  DO NOT EXCEED A TOTAL OF 3 DOSES IN 15 MINUTES Patient not taking: No sig reported 10/08/18   Herminio Commons, MD  omeprazole (PRILOSEC) 20 MG capsule Take 1 capsule by mouth once daily 01/31/19   Rehman, Mechele Dawley, MD  simvastatin (ZOCOR) 20 MG tablet TAKE ONE TABLET (20 MG) BY MOUTH DAILY AT BEDTIME Patient not taking: Reported on 03/05/2019 04/03/18   Herminio Commons, MD  ticagrelor (BRILINTA) 60 MG TABS tablet Take 1 tablet (60 mg total) by mouth 2 (two) times daily. 08/17/18   Almyra Deforest, PA     Allergies:     Allergies  Allergen Reactions  . Aspirin Other (See Comments)    High Doses Mess up her stomach; "makes my bowels have blood in them". Takes 81 mg EC Aspirin   . Penicillins Other (See Comments)    SYNCOPE? , "makes me real weak when I take it; like I'll pass out"  Has patient had a PCN reaction causing immediate rash, facial/tongue/throat swelling, SOB or lightheadedness with hypotension: Yes Has patient had a PCN reaction causing severe rash involving mucus membranes or skin necrosis: no Has patient had a PCN reaction that required hospitalization no Has patient had a PCN reaction occurring within the last 10 years: no If all of the above  . Amlodipine Swelling  . Bactrim [Sulfamethoxazole-Trimethoprim] Rash  . Contrast Media [Iodinated Diagnostic Agents] Itching  . Iron Itching and Other (See Comments)    "they gave me iron in dialysis; had to give me Benadryl cause I had to have the iron" (05/02/2012)  . Nitrofurantoin Hives  . Tylenol  [Acetaminophen] Itching and Other (See Comments)    Makes her feet on fire per pt  . Gabapentin Other (See Comments)    Unknown reaction  . Hydralazine Itching  . Dexilant [Dexlansoprazole] Other (See Comments)    Upset stomach  . Levaquin [Levofloxacin In D5w] Rash  . Morphine And Related Itching and Other (See Comments)    Itching in feet  . Plavix [Clopidogrel Bisulfate] Rash  . Protonix [Pantoprazole Sodium] Rash  . Venofer [Ferric Oxide] Itching and Other (See Comments)    Patient reports using Benadryl prior to doses as [facility]     Physical Exam:   Vitals  Blood pressure (!) 164/56, pulse 68, temperature 99.4 F (37.4 C), temperature source Oral, resp. rate 13, height 5\' 1"  (1.549 m), weight 56 kg, SpO2 100 %.  1.  General: At time of second exam patient is lying supine in bed with BiPAP on in no acute distress  2. Psychiatric: Patient is pleasant alert and oriented to self place and context  3. Neurologic: Cranial nerves II through XII are grossly intact, and no focal deficit on limited exam  4. HEENMT:  Head is atraumatic normocephalic pupils are reactive to light there are no neck masses trachea is midline  5. Respiratory : Upon first exam patient had crackles more on the right than the left, but present bilaterally.  On second exam crackles are improved, but still minimally present on the right  6. Cardiovascular : H RRR  7. Gastrointestinal:  Soft nondistended nontender to palpation hypoactive bowel sounds  9.Musculoskeletal:  2+ peripheral edema    Data Review:    CBC Recent Labs  Lab 03/02/19 1203 03/05/19 1159 03/07/19 2356  WBC 6.3 6.9 7.6  HGB 10.1* 8.6* 7.3*  HCT 32.0* 26.9* 24.0*  PLT 189 232 223  MCV 107.7* 106.3* 114.3*  MCH 34.0 34.0* 34.8*  MCHC 31.6 32.0 30.4  RDW 17.9* 15.7* 17.9*  LYMPHSABS  --  752* 0.7  MONOABS  --   --  0.8  EOSABS  --  221 0.2  BASOSABS  --  62 0.0    ------------------------------------------------------------------------------------------------------------------  Results for orders placed or performed during the hospital encounter of 03/07/19 (from the past 48 hour(s))  CBC with Differential/Platelet     Status: Abnormal   Collection Time: 03/07/19 11:56 PM  Result Value Ref Range   WBC 7.6 4.0 - 10.5 K/uL   RBC 2.10 (L) 3.87 - 5.11 MIL/uL   Hemoglobin 7.3 (L) 12.0 - 15.0 g/dL   HCT 24.0 (L) 36.0 - 46.0 %   MCV 114.3 (H) 80.0 - 100.0 fL   MCH 34.8 (H) 26.0 - 34.0 pg   MCHC 30.4 30.0 - 36.0 g/dL   RDW 17.9 (H) 11.5 - 15.5 %   Platelets 223 150 - 400 K/uL   nRBC 0.0 0.0 - 0.2 %   Neutrophils Relative % 77 %   Neutro Abs 5.9 1.7 - 7.7 K/uL   Lymphocytes Relative 9 %   Lymphs Abs 0.7 0.7 - 4.0 K/uL   Monocytes Relative 11 %   Monocytes Absolute 0.8 0.1 - 1.0 K/uL   Eosinophils Relative 3 %   Eosinophils Absolute 0.2 0.0 - 0.5 K/uL   Basophils Relative 0 %   Basophils Absolute 0.0 0.0 - 0.1 K/uL   Immature Granulocytes 0 %   Abs Immature Granulocytes 0.02 0.00 - 0.07 K/uL    Comment: Performed at [facility] [address], [place], [place] [address]  Comprehensive metabolic panel     Status: Abnormal   Collection Time: 03/07/19 11:56 PM  Result Value Ref Range   Sodium 142 135 - 145 mmol/L   Potassium 4.2 3.5 - 5.1 mmol/L   Chloride 100 98 - 111 mmol/L   CO2 28 22 - 32 mmol/L   Glucose, Bld 129 (H) 70 - 99 mg/dL   BUN 35 (H) 8 - 23 mg/dL   Creatinine, Ser 6.89 (H) 0.44 - 1.00 mg/dL   Calcium 9.3 8.9 - 10.3 mg/dL   Total Protein 6.1 (L) 6.5 - 8.1 g/dL   Albumin 3.4 (L) 3.5 - 5.0 g/dL   AST 9 (L) 15 - 41 U/L   ALT 9 0 - 44 U/L   Alkaline Phosphatase 59 38 - 126 U/L   Total Bilirubin 0.5 0.3 -  1.2 mg/dL   GFR calc non Af Amer 5 (L) >60 mL/min   GFR calc Af Amer 6 (L) >60 mL/min   Anion gap 14 5 - 15    Comment: Performed at [facility], [address], [place], [place] [address]  Type and screen     Status: None    Collection Time: 03/07/19 11:56 PM  Result Value Ref Range   ABO/RH(D) O POS    Antibody Screen NEG    Sample Expiration      03/10/2019,2359 Performed at [facility], [address], [place], [place] [address]   Protime-INR     Status: None   Collection Time: 03/07/19 11:56 PM  Result Value Ref Range   Prothrombin Time 13.7 11.4 - 15.2 seconds   INR 1.1 0.8 - 1.2    Comment: (NOTE) INR goal varies based on device and disease states. Performed at [facility], [address], [place], [place] [address]   PTT     Status: None   Collection Time: 03/07/19 11:56 PM  Result Value Ref Range   aPTT 32 24 - 36 seconds    Comment: Performed at [facility], [address] [place], [place] [address]  Fibrinogen     Status: Abnormal   Collection Time: 03/07/19 11:56 PM  Result Value Ref Range   Fibrinogen 481 (H) 210 - 475 mg/dL    Comment: Performed at [facility], [address] [place], [place] [address]  Prepare RBC     Status: None   Collection Time: 03/07/19 11:56 PM  Result Value Ref Range   Order Confirmation      ORDER PROCESSED BY BLOOD BANK Performed at [facility], [address], [place], [place] [address]   SARS Coronavirus 2 [facility] order, Performed in [facility] hospital lab) Nasopharyngeal Nasopharyngeal Swab     Status: None   Collection Time: 03/08/19 12:55 AM   Specimen: Nasopharyngeal Swab  Result Value Ref Range   SARS Coronavirus 2 NEGATIVE NEGATIVE    Comment: (NOTE) If result is NEGATIVE SARS-CoV-2 target nucleic acids are NOT DETECTED. The SARS-CoV-2 RNA is generally detectable in upper and lower  respiratory specimens during the acute phase of infection. The lowest  concentration of SARS-CoV-2 viral copies this assay can detect is 250  copies / mL. A negative result does not preclude SARS-CoV-2 infection  and should not be used as the sole basis for treatment or other  patient management decisions.  A negative result may occur with  improper specimen  collection / handling, submission of specimen other  than nasopharyngeal swab, presence of viral mutation(s) within the  areas targeted by this assay, and inadequate number of viral copies  (<250 copies / mL). A negative result must be combined with clinical  observations, patient history, and epidemiological information. If result is POSITIVE SARS-CoV-2 target nucleic acids are DETECTED. The SARS-CoV-2 RNA is generally detectable in upper and lower  respiratory specimens dur ing the acute phase of infection.  Positive  results are indicative of active infection with SARS-CoV-2.  Clinical  correlation with patient history and other diagnostic information is  necessary to determine patient infection status.  Positive results do  not rule out bacterial infection or co-infection with other viruses. If result is PRESUMPTIVE POSTIVE SARS-CoV-2 nucleic acids MAY BE PRESENT.   A presumptive positive result was obtained on the submitted specimen  and confirmed on repeat testing.  While 2019 novel coronavirus  (SARS-CoV-2) nucleic acids may be present in  the submitted sample  additional confirmatory testing may be necessary for epidemiological  and / or clinical management purposes  to differentiate between  SARS-CoV-2 and other Sarbecovirus currently known to infect humans.  If clinically indicated additional testing with an alternate test  methodology [id number]) is advised. The SARS-CoV-2 RNA is generally  detectable in upper and lower respiratory sp ecimens during the acute  phase of infection. The expected result is Negative. Fact Sheet for Patients:  StrictlyIdeas.no Fact Sheet for Healthcare Providers: BankingDealers.co.za This test is not yet approved or cleared by the [place] FDA and has been authorized for detection and/or diagnosis of SARS-CoV-2 by FDA under an Emergency Use Authorization (EUA).  This EUA will remain in effect  (meaning this test can be used) for the duration of the COVID-19 declaration under Section 564(b)(1) of the Act, 21 U.S.C. section 360bbb-3(b)(1), unless the authorization is terminated or revoked sooner. Performed at [facility], [address], [place], [place] [address]   Blood gas, arterial (at [facility])     Status: Abnormal   Collection Time: 03/08/19  1:44 AM  Result Value Ref Range   FIO2 32.00    pH, Arterial 7.326 (L) 7.350 - 7.450   pCO2 arterial 49.4 (H) 32.0 - 48.0 mmHg   pO2, Arterial 67.3 (L) 83.0 - 108.0 mmHg   Bicarbonate 23.9 20.0 - 28.0 mmol/L   Acid-base deficit 0.1 0.0 - 2.0 mmol/L   O2 Saturation 87.8 %   Patient temperature 37.0    Drawn by [id number]    [place] test (pass/fail) PASS PASS    Comment: Performed at [facility], [address] [place], [place] [address]    Chemistries  Recent Labs  Lab 03/02/19 1203 03/05/19 1159 03/07/19 2356  NA 138 139 142  K 4.0 4.1 4.2  CL 96* 98 100  CO2 29 30 28   GLUCOSE 103* 111 129*  BUN 38* 35* 35*  CREATININE 6.61* 6.57* 6.89*  CALCIUM 9.3 9.1 9.3  AST  --  4* 9*  ALT  --  4* 9  ALKPHOS  --   --  59  BILITOT  --  0.7 0.5   ------------------------------------------------------------------------------------------------------------------  ------------------------------------------------------------------------------------------------------------------ GFR: Estimated Creatinine Clearance: 5.1 mL/min (A) (by C-G formula based on SCr of 6.89 mg/dL (H)). Liver Function Tests: Recent Labs  Lab 03/05/19 1159 03/07/19 2356  AST 4* 9*  ALT 4* 9  ALKPHOS  --  59  BILITOT 0.7 0.5  PROT 5.6* 6.1*  ALBUMIN  --  3.4*   No results for input(s): LIPASE, AMYLASE in the last 168 hours. No results for input(s): AMMONIA in the last 168 hours. Coagulation Profile: Recent Labs  Lab 03/07/19 2356  INR 1.1   Cardiac Enzymes: No results for input(s): CKTOTAL, CKMB, CKMBINDEX, TROPONINI in the last 168 hours. BNP (last  3 results) No results for input(s): PROBNP in the last 8760 hours. HbA1C: No results for input(s): HGBA1C in the last 72 hours. CBG: No results for input(s): GLUCAP in the last 168 hours. Lipid Profile: No results for input(s): CHOL, HDL, LDLCALC, TRIG, CHOLHDL, LDLDIRECT in the last 72 hours. Thyroid Function Tests: No results for input(s): TSH, T4TOTAL, FREET4, T3FREE, THYROIDAB in the last 72 hours. Anemia Panel: Recent Labs    03/05/19 1159  FERRITIN 998*  TIBC 179*  IRON 70    --------------------------------------------------------------------------------------------------------------- Urine analysis:    Component Value Date/Time   COLORURINE YELLOW 12/16/2012 1919   APPEARANCEUR CLOUDY (A) 12/16/2012 1919   LABSPEC  1.009 12/16/2012 1919   PHURINE 7.5 12/16/2012 1919   GLUCOSEU NEGATIVE 12/16/2012 1919   HGBUR TRACE (A) 12/16/2012 1919   BILIRUBINUR NEGATIVE 12/16/2012 1919   KETONESUR NEGATIVE 12/16/2012 1919   PROTEINUR 100 (A) 12/16/2012 1919   UROBILINOGEN 0.2 12/16/2012 1919   NITRITE NEGATIVE 12/16/2012 1919   LEUKOCYTESUR SMALL (A) 12/16/2012 1919      Imaging Results:    Dg Chest Port 1 View  Result Date: 03/08/2019 CLINICAL DATA:  Lower GI bleed EXAM: PORTABLE CHEST 1 VIEW COMPARISON:  03/02/2019 FINDINGS: Small bilateral pleural effusions. Cardiomegaly with vascular congestion and diffuse interstitial opacity compatible with edema. Basilar consolidations. No pneumothorax. Post sternotomy changes. IMPRESSION: 1. Cardiomegaly with worsened vascular congestion and increased interstitial edema. Interim development of small bilateral pleural effusions and basilar atelectasis or pneumonia Electronically Signed   By: Donavan Foil M.D.   On: 03/08/2019 02:00    My personal review of EKG: Rhythm NSR, Rate 89 /min, QTc 512,no Acute ST changes   Assessment & Plan:    Active Problems:   GI bleed   1. GI bleed 1. Hold Brilinta and aspirin at this time.  Every  6 hours H&H.  Consult GI.  Start Protonix IV.  Patient has reported allergy to Protonix.  She has been on omeprazole at home.  Allergies reported as rash.  Benadryl added in case patient should experience pruritus or rash.  Patient has been typed and screened.  Blood bank has been asked to hold 2 units ahead.  2 units ordered for transfusion.  The plan is for transfusion to be done during dialysis.  They are ordering the blood from [place] so it is unclear if it will be here in time.  Patient's hemoglobin is 7.3 during an acute bleed. 2. ESRD on HD 1. Consult nephrology. 2. Monday Wednesday Friday schedule. 3. Left AV fistula access. 4. No missed dialysis sessions. 3. Flash pulmonary edema 1. Patient currently on nitro drip. 2. Continue BiPAP. 3. Continue to monitor volume status. 4. Likely to improve after dialysis. 5. Diastolic CHF also contributing. 6. Patient makes no urine- therefore Lasix not given. 4. Protein calorie malnutrition 1. Albumin is 3.4. 2. Encourage nutrient dense foods. 5. Diastolic CHF 1. Last echo was January 13, 2019. 2. Ejection fraction 60 to 65%. 3. Monitor volume status closely. 4. Continue home meds minus aspirin. 6. Hyperglycemia 1. No history of diabetes mellitus type 2. 2. Continue to monitor. 3. If glucose should increase add sliding scale.    DVT Prophylaxis-SCDs-holding off on pharmacologic due to active bleed AM Labs Ordered, also please review Full Orders  Family Communication: Admission, patients condition and plan of care including tests being ordered have been discussed with the patient and husband who indicate understanding and agree with the plan and Code Status.  Code Status: Full  Admission status: Inpatient: Based on patients clinical presentation and evaluation of above clinical data, I have made determination that patient meets Inpatient criteria at this time.  Time spent in minutes : 75 minutes   Rolla Plate M.D on 03/08/2019  at 5:52 AM

## 2019-03-08 NOTE — Progress Notes (Addendum)
PROGRESS NOTE  Shawna [name] BLT:[id number] DOB: [birth year] DOA: 03/07/2019 PCP: [name], MD  Brief History:  78 y.o. female with medical history of ESRD (MWF) who normally dialyzes at [facility] in [place], diastolic CHF, hypertension, hyperlipidemia, colonic AVMs, TIA, coronary artery disease presenting with hematochezia over the past 2 to 3 days.  The patient was seen in the emergency department on 03/02/2019.  Hemoccult was positive at that time with a hemoglobin of 10.1.  As the patient was clinically stable, she was discharged home to follow-up with her GI physician.  The patient presented to short stay on 03/07/2019 capsule endoscopy.  After the patient went home, the patient stated that she felt somewhat "drunk" and lethargic.  She proceeded through the capsule endoscopy procedure and subsequently returned equipment later in the afternoon on 03/07/2019.  However, the patient had an episode of hematochezia at home late in the afternoon.  As result, the patient returned to the hospital for further evaluation.  Upon presentation, the patient was noted to have a hemoglobin of 7.3.  She subsequently developed shortness of breath.  Chest x-ray showed pulmonary edema in the emergency department.  She was subsequently placed on BiPAP.  The patient has been compliant with dialysis, last dialyzed on 03/06/2019.  However, she states that occasionally she has had to be taken off early because of low blood pressure.  She denies any dietary or fluid indiscretion.  She denies any fevers, chills, chest pain, nausea, vomiting, abdominal pain, headache, neck pain, coughing, hemoptysis. In the emergency department, the patient had a low-grade temperature 9 9.4 F.  She was stable on BiPAP with FiO2 35% with oxygen saturation of 100%.  BMP was unremarkable except for serum creatinine 6.89.  LFTs were unremarkable.  EKG showed sinus rhythm with left bundle branch block.  Chest x-ray showed pulmonary vascular  congestion and small bilateral pleural effusions.  GI and nephrology were consulted to assist with management.  Assessment/Plan:  Hematochezia/acute blood loss anemia -Holding Brilinta  -GI consult--Dr. Laural Golden has been informed -Based on hemoglobin 8-9 -Patient has reported rash to Protonix -She is on omeprazole at home -Restart omeprazole once the patient is off BiPAP -2 units PRBCs have been ordered  ESRD -Nephrology has been consulted -She is on dialysis Monday/Wednesday/Friday  Flash pulmonary edema/Acute respiratory failure with hypoxia -Currently stable on BiPAP -Nephrology consulted for dialysis -Wean off nitroglycerin drip as tolerated -12/14/2018 Echo EF 60 to 65%, positive diastolic dysfunction, mild to moderate MR -SARS-CoV2--neg  Paroxysmal atrial fibrillation/SVT -Restart amiodarone -Restart carvedilol once patient is off BiPAP  Essential hypertension -Continue carvedilol, clonidine, Imdur  Coronary artery disease -No chest pain presently -Restart Imdur, carvedilol, Zocor -Aspirin and Brilinta on hold secondary to GI bleed -repeatcath in 07/2018 showing grafts with occlusion of D1 at prior stent site and progression of PDA disease -->medical management recommended.   Hyperglycemia -Check hemoglobin A1c      Disposition Plan:   Home in 2-3 days  Family Communication:   Spouse updated at bedside 8/28  Consultants:  GI, renal, cardiology  Code Status:  FULL   DVT Prophylaxis:  SCDs   Procedures: As Listed in Progress Note Above  Antibiotics: None    The patient is critically ill with multiple organ systems failure and requires high complexity decision making for assessment and support, frequent evaluation and titration of therapies, application of advanced monitoring technologies and extensive interpretation of multiple databases.  Critical care time -  35 mins. In addition to time spent by Dr. Orlin Hilding    Subjective: Patient denies  fevers, chills, headache, chest pain, dyspnea, nausea, vomiting, diarrhea, abdominal pain, dysuria, hematuria, hematochezia, and melena.   Objective: Vitals:   03/08/19 0545 03/08/19 0600 03/08/19 0630 03/08/19 0700  BP:  (!) 145/64 130/64 (!) 126/46  Pulse: 68 68 66 63  Resp: (!) 22 (!) 26 12 12   Temp:      TempSrc:      SpO2: 100% 100% 100% 100%  Weight:      Height:       No intake or output data in the 24 hours ending 03/08/19 0921 Weight change:  Exam:   General:  Pt is alert, follows commands appropriately, not in acute distress  HEENT: No icterus, No thrush, No neck mass, [place]/AT  Cardiovascular: RRR, S1/S2, no rubs, no gallops  Respiratory: bilateral crackles.  Abdomen: Soft/+BS, non tender, non distended, no guarding  Extremities: No edema, No lymphangitis, No petechiae, No rashes, no synovitis   Data Reviewed: I have personally reviewed following labs and imaging studies Basic Metabolic Panel: Recent Labs  Lab 03/02/19 1203 03/05/19 1159 03/07/19 2356  NA 138 139 142  K 4.0 4.1 4.2  CL 96* 98 100  CO2 29 30 28   GLUCOSE 103* 111 129*  BUN 38* 35* 35*  CREATININE 6.61* 6.57* 6.89*  CALCIUM 9.3 9.1 9.3   Liver Function Tests: Recent Labs  Lab 03/05/19 1159 03/07/19 2356  AST 4* 9*  ALT 4* 9  ALKPHOS  --  59  BILITOT 0.7 0.5  PROT 5.6* 6.1*  ALBUMIN  --  3.4*   No results for input(s): LIPASE, AMYLASE in the last 168 hours. No results for input(s): AMMONIA in the last 168 hours. Coagulation Profile: Recent Labs  Lab 03/07/19 2356  INR 1.1   CBC: Recent Labs  Lab 03/02/19 1203 03/05/19 1159 03/07/19 2356 03/08/19 0512  WBC 6.3 6.9 7.6 11.7*  NEUTROABS  --  5,134 5.9 9.5*  HGB 10.1* 8.6* 7.3* 7.5*  HCT 32.0* 26.9* 24.0* 23.5*  MCV 107.7* 106.3* 114.3* 107.8*  PLT 189 232 223 215   Cardiac Enzymes: No results for input(s): CKTOTAL, CKMB, CKMBINDEX, TROPONINI in the last 168 hours. BNP: Invalid input(s): POCBNP CBG: No results  for input(s): GLUCAP in the last 168 hours. HbA1C: No results for input(s): HGBA1C in the last 72 hours. Urine analysis:    Component Value Date/Time   COLORURINE YELLOW 12/16/2012 1919   APPEARANCEUR CLOUDY (A) 12/16/2012 1919   LABSPEC 1.009 12/16/2012 1919   PHURINE 7.5 12/16/2012 1919   GLUCOSEU NEGATIVE 12/16/2012 1919   HGBUR TRACE (A) 12/16/2012 1919   BILIRUBINUR NEGATIVE 12/16/2012 1919   KETONESUR NEGATIVE 12/16/2012 1919   PROTEINUR 100 (A) 12/16/2012 1919   UROBILINOGEN 0.2 12/16/2012 1919   NITRITE NEGATIVE 12/16/2012 1919   LEUKOCYTESUR SMALL (A) 12/16/2012 1919   Sepsis Labs: @LABRCNTIP (procalcitonin:4,lacticidven:4) ) Recent Results (from the past 240 hour(s))  SARS Coronavirus 2 [facility] order, Performed in [facility] hospital lab) Nasopharyngeal Nasopharyngeal Swab     Status: None   Collection Time: 03/08/19 12:55 AM   Specimen: Nasopharyngeal Swab  Result Value Ref Range Status   SARS Coronavirus 2 NEGATIVE NEGATIVE Final    Comment: (NOTE) If result is NEGATIVE SARS-CoV-2 target nucleic acids are NOT DETECTED. The SARS-CoV-2 RNA is generally detectable in upper and lower  respiratory specimens during the acute phase of infection. The lowest  concentration of SARS-CoV-2 viral copies this  assay can detect is 250  copies / mL. A negative result does not preclude SARS-CoV-2 infection  and should not be used as the sole basis for treatment or other  patient management decisions.  A negative result may occur with  improper specimen collection / handling, submission of specimen other  than nasopharyngeal swab, presence of viral mutation(s) within the  areas targeted by this assay, and inadequate number of viral copies  (<250 copies / mL). A negative result must be combined with clinical  observations, patient history, and epidemiological information. If result is POSITIVE SARS-CoV-2 target nucleic acids are DETECTED. The SARS-CoV-2 RNA is generally  detectable in upper and lower  respiratory specimens dur ing the acute phase of infection.  Positive  results are indicative of active infection with SARS-CoV-2.  Clinical  correlation with patient history and other diagnostic information is  necessary to determine patient infection status.  Positive results do  not rule out bacterial infection or co-infection with other viruses. If result is PRESUMPTIVE POSTIVE SARS-CoV-2 nucleic acids MAY BE PRESENT.   A presumptive positive result was obtained on the submitted specimen  and confirmed on repeat testing.  While 2019 novel coronavirus  (SARS-CoV-2) nucleic acids may be present in the submitted sample  additional confirmatory testing may be necessary for epidemiological  and / or clinical management purposes  to differentiate between  SARS-CoV-2 and other Sarbecovirus currently known to infect humans.  If clinically indicated additional testing with an alternate test  methodology [phone]) is advised. The SARS-CoV-2 RNA is generally  detectable in upper and lower respiratory sp ecimens during the acute  phase of infection. The expected result is Negative. Fact Sheet for Patients:  StrictlyIdeas.no Fact Sheet for Healthcare Providers: BankingDealers.co.za This test is not yet approved or cleared by the [place] FDA and has been authorized for detection and/or diagnosis of SARS-CoV-2 by FDA under an Emergency Use Authorization (EUA).  This EUA will remain in effect (meaning this test can be used) for the duration of the COVID-19 declaration under Section 564(b)(1) of the Act, 21 U.S.C. section 360bbb-3(b)(1), unless the authorization is terminated or revoked sooner. Performed at [facility], [address], [place], [place] [address]      Scheduled Meds:  sodium chloride   Intravenous Once   ALPRAZolam  0.5 mg Oral Daily   amiodarone  200 mg Oral Daily   carvedilol  12.5 mg  Oral BID WC   Chlorhexidine Gluconate Cloth  6 each Topical Q0600   lidocaine-prilocaine  1 application Topical Q M,W,F   multivitamin  1 tablet Oral Daily   pantoprazole  40 mg Intravenous Once   simvastatin  40 mg Oral q1800   Continuous Infusions:  sodium chloride     sodium chloride     nitroGLYCERIN 20 mcg/min (03/08/19 0321)    Procedures/Studies: Ct Abdomen Pelvis Wo Contrast  Result Date: 03/02/2019 CLINICAL DATA:  Black stools for 3 days.  Abdominal pain. EXAM: CT ABDOMEN AND PELVIS WITHOUT CONTRAST TECHNIQUE: Multidetector CT imaging of the abdomen and pelvis was performed following the standard protocol without IV contrast. COMPARISON:  02/14/2019 FINDINGS: Lower chest: Bronchial wall thickening noted both lower lobes with interstitial and ground-glass airspace opacity, right greater than left. Hepatobiliary: No focal abnormality in the liver on this study without intravenous contrast. There is no evidence for gallstones, gallbladder wall thickening, or pericholecystic fluid. No intrahepatic or extrahepatic biliary dilation. Pancreas: No focal mass lesion. No dilatation of the main duct. No intraparenchymal cyst.  No peripancreatic edema. Spleen: No splenomegaly. No focal mass lesion. Adrenals/Urinary Tract: No adrenal nodule or mass. Kidneys are atrophic with multiple low-density and intermediate density lesions, incompletely characterized but similar to prior. No evidence for hydroureter. Bladder decompressed. Stomach/Bowel: Stomach is unremarkable. No gastric wall thickening. No evidence of outlet obstruction. Duodenum is normally positioned as is the ligament of Treitz. No small bowel wall thickening. No small bowel dilatation. The appendix is not visualized, but there is no edema or inflammation in the region of the cecum. Apparent staple line noted in the right colon. Diverticuli are seen scattered along the entire length of the colon without CT findings of diverticulitis.  Left colonic anastomosis noted near the rectosigmoid junction. Vascular/Lymphatic: There is abdominal aortic atherosclerosis without aneurysm. There is no gastrohepatic or hepatoduodenal ligament lymphadenopathy. No intraperitoneal or retroperitoneal lymphadenopathy. No pelvic sidewall lymphadenopathy. Reproductive: The uterus is surgically absent. There is no adnexal mass. Other: No intraperitoneal free fluid. Musculoskeletal: No worrisome lytic or sclerotic osseous abnormality. IMPRESSION: 1. Interval development of interstitial and basilar airspace opacity in both lungs. Basilar infection/inflammation a consideration. 2. Bilateral lesions of varying size and attenuation again noted in the atrophic kidneys. The 3.0 x 2.3 cm heterogeneous lesion in question involving the upper pole right kidney is stable. While this cannot be definitively characterized, is not substantially changed in 1 year since 12/28/2017. Continued attention on follow-up recommended. 3. Diffuse diverticulosis without diverticulitis 4.  Aortic Atherosclerois (ICD10-170.0) Electronically Signed   By: Misty Stanley M.D.   On: 03/02/2019 19:56   Ct Abdomen Pelvis Wo Contrast  Result Date: 02/14/2019 CLINICAL DATA:  78 year old female with history of indeterminate lesion in the upper pole of the right kidney. Follow-up study. EXAM: CT ABDOMEN AND PELVIS WITHOUT CONTRAST TECHNIQUE: Multidetector CT imaging of the abdomen and pelvis was performed following the standard protocol without IV contrast. COMPARISON:  CT the abdomen and pelvis 12/28/2017. FINDINGS: Lower chest: Scattered areas of mild cylindrical bronchiectasis and scarring in the lower lungs bilaterally. Atherosclerotic calcifications in the descending thoracic aorta as well as the left anterior descending, left circumflex and right coronary arteries. Hepatobiliary: No definite cystic or solid hepatic lesions are confidently identified on today's noncontrast CT examination. Amorphous  intermediate attenuation lying dependently in the gallbladder likely to reflect biliary sludge. No findings to suggest an acute cholecystitis at this time. Pancreas: No definite pancreatic mass or peripancreatic fluid collections or inflammatory changes are noted on today's noncontrast CT examination. Spleen: Unremarkable. Adrenals/Urinary Tract: Atrophy of both kidneys. Multiple lesions in both kidneys, most of which are low-attenuation, none of which are characterized on today's noncontrast CT examination. There is again an exophytic lesion in the upper pole of the right kidney which currently measures 3.2 x 2.3 cm and is intermediate attenuation (30 HU), similar to the prior examinations. Multiple calcifications associated with renal hila bilaterally appear to be vascular. Stomach/Bowel: Normal appearance of the stomach. No pathologic dilatation of small bowel or colon. Suture line near the rectosigmoid junction suggesting prior partial colectomy. Numerous colonic diverticulae are noted, particularly in the descending colon and sigmoid colon, without surrounding inflammatory changes to suggest an acute diverticulitis at this time. The appendix is not confidently identified and may be surgically absent. Regardless, there are no inflammatory changes noted adjacent to the cecum to suggest the presence of an acute appendicitis at this time. Vascular/Lymphatic: Aortic atherosclerosis. No lymphadenopathy noted in the abdomen or pelvis. Reproductive: Status post hysterectomy. Ovaries are not confidently identified may be surgically absent or  atrophic. Other: No significant volume of ascites.  No pneumoperitoneum. Musculoskeletal: There are no aggressive appearing lytic or blastic lesions noted in the visualized portions of the skeleton. IMPRESSION: 1. Indeterminate lesion in the upper pole of the right kidney is stable compared to the prior study and favored to be benign, or a slow growing low-grade renal neoplasm. 2.  Aortic atherosclerosis, in addition to at least 3 vessel coronary artery disease. Assessment for potential risk factor modification, dietary therapy or pharmacologic therapy may be warranted, if clinically indicated. 3. Severe colonic diverticulosis without evidence of acute diverticulitis at this time. 4. Aortic atherosclerosis. 5. Additional incidental findings, as above. Electronically Signed   By: Vinnie Langton M.D.   On: 02/14/2019 16:29   Dg Chest Port 1 View  Result Date: 03/08/2019 CLINICAL DATA:  Lower GI bleed EXAM: PORTABLE CHEST 1 VIEW COMPARISON:  03/02/2019 FINDINGS: Small bilateral pleural effusions. Cardiomegaly with vascular congestion and diffuse interstitial opacity compatible with edema. Basilar consolidations. No pneumothorax. Post sternotomy changes. IMPRESSION: 1. Cardiomegaly with worsened vascular congestion and increased interstitial edema. Interim development of small bilateral pleural effusions and basilar atelectasis or pneumonia Electronically Signed   By: Donavan Foil M.D.   On: 03/08/2019 02:00   Dg Chest Port 1 View  Result Date: 03/02/2019 CLINICAL DATA:  Shortness breath. Cough EXAM: PORTABLE CHEST 1 VIEW COMPARISON:  Chest radiograph 02/05/2019. Lung bases from abdominal CT earlier this day. FINDINGS: Post median sternotomy. Similar cardiomegaly. Unchanged mediastinal contours. Increased interstitial and bronchial thickening since prior exam. Linear scarring in the left mid lung. No pleural effusion or pneumothorax. Stable osseous structures. IMPRESSION: Increased interstitial and bronchial thickening since prior exam, favoring pulmonary edema. Acute infection is also considered. Stable cardiomegaly. Electronically Signed   By: Keith Rake M.D.   On: 03/02/2019 20:59    Orson Eva, DO  [facility] Pager [phone]  If 7PM-7AM, please contact night-coverage [email or web address] Password [email or web address] 03/08/2019, 9:21 AM   LOS: 0 days

## 2019-03-08 NOTE — Care Management Important Message (Signed)
Important Message  Patient Details  Name: Shawna [name] MRN: [id number] Date of Birth: July 02, 1941   Medicare Important Message Given:  Yes     [name] 03/08/2019, 3:35 PM

## 2019-03-08 NOTE — Procedures (Signed)
    HEMODIALYSIS TREATMENT NOTE:  3.5 hour heparin-free dialysis session completed via left upper arm AVF (15g/antegrade). Goal met:  2.5L removed without interruption in ultrafiltration.  2 units PRBCs transfused with HD.  All blood was returned and hemostasis was achieved in 15 minutes.  Treatment balance:  Intake:  NS  +700   PRBC +630  UF removed:  (-3830) __________________________  NET UF: 2.5 liters    , RN 

## 2019-03-08 NOTE — Progress Notes (Signed)
Referring Provider:  Orson Eva, DO Primary Care Physician:  [name], MD Primary Gastroenterologist:  Dr. Laural Golden  Reason for Consultation:   GI bleed and anemia.  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  In the office she was noted to have tarry heme positive stool.  Her hemoglobin was 8.6 g.  Given history of jejunal AV malformation we decided neck step in her work-up would be small bowel given capsule study.  Study was performed yesterday and read this afternoon.  In the meantime patient passed large amount of blood per rectum and she also experienced abdominal pain.  She therefore came to emergency room.  Her hemoglobin had dropped to 7.3.  She was also felt to be in flash pulmonary edema.  Patient was admitted to ICU.  She has received 2 units of PRBCs and she is being dialyzed.  She is not having any shortness of breath.  Patient states she has not passed any more blood per rectum.  She denies nausea vomiting hematemesis or chest pain. She states she has a good appetite but she states she has lost a lot of weight.  However review of weight from last year reveals that she only has lost 3 pounds. Patient is on low-dose aspirin and Brilinta.    Past Medical History:  Diagnosis Date  . Acute on chronic respiratory failure with hypoxia ([place]) 10/10/2016  . Anxiety   . Arthritis   . AVM (arteriovenous malformation) of colon   . CAD (coronary artery disease)    a. s/p CABG in 2013 b. most recent DES to D1 in 10/2016 with cath showing patent LIMA-LAD, SVG-RI, SVG-Mrg and SVG-RCA  c. NST in 02/2018 showing scar with no significant ischemia  .  Carotid artery disease ([place])    a. 40-10% LICA, 08/7251   . Chronic bronchitis ([place])   . Chronic diastolic CHF (congestive heart failure) ([place])    a. 02/2012 Echo EF 60-65%, nl wall motion, Gr 1 DD, mod MR  . Colon cancer ([place]) 1992  . Esophageal stricture   . ESRD on hemodialysis ([place])    ESRD due to HTN, started dialysis 2011 and gets HD at [facility] with Dr Hinda Lenis on MWF schedule.  Access is LUA AVF as of Sept 2014.   Marland Kitchen GERD (gastroesophageal reflux disease)   . High cholesterol 12/2011  . History of blood transfusion 07/2011; 12/2011; 01/2012 X 2; 04/2012  . History of gout   . History of lower GI bleeding   . Hypertension   . Iron deficiency anemia   . Mitral regurgitation    a. Moderate by echo, 02/2012  . Myocardial infarction ([place])   . Ovarian cancer ([place]) 1992  . Pneumonia ~ 2009  . PUD (peptic ulcer disease)   . TIA (transient ischemic attack)         Patient hospitalized in September 2018 for acute abdomen felt to be due to walled off small bowel perforation.  She has small abscess and responded to antibiotic therapy.  Etiology was felt to be ischemic injury.   Past Surgical History:  Procedure Laterality Date  . ABDOMINAL HYSTERECTOMY  1992  . APPENDECTOMY  06/1990  . AV FISTULA PLACEMENT  07/2009   left upper arm  . AV FISTULA PLACEMENT Right 09/06/2016   Procedure: RIGHT FOREARM ARTERIOVENOUS (AV) GRAFT;  Surgeon: Elam Dutch, MD;  Location: [facility] OR;  Service: Vascular;  Laterality: Right;  . AV FISTULA PLACEMENT N/A 02/24/2017   Procedure: INSERTION OF ARTERIOVENOUS (AV) GORE-TEX GRAFT ARM (BRACHIAL AXILLARY);  Surgeon: Katha Cabal, MD;  Location: [facility] ORS;  Service: Vascular;  Laterality: N/A;  . [place] Right 09/06/2016   Procedure: REMOVAL OF Right Arm ARTERIOVENOUS GORETEX GRAFT and Vein Patch angioplasty of brachial artery;  Surgeon: Angelia Mould, MD;  Location: [place];  Service: Vascular;  Laterality: Right;  . COLON RESECTION  1992  . COLON  SURGERY    . CORONARY ANGIOPLASTY WITH STENT PLACEMENT  12/15/11   "2"  . CORONARY ANGIOPLASTY WITH STENT PLACEMENT  y/2013   "1; makes total of 3" (05/02/2012)  . CORONARY ARTERY BYPASS GRAFT  06/13/2012   Procedure: CORONARY ARTERY BYPASS GRAFTING (CABG);  Surgeon: Grace Isaac, MD;  Location: [place];  Service: Open Heart Surgery;  Laterality: N/A;  cabg x four;  using left internal mammary artery, and left leg greater saphenous vein harvested endoscopically  . CORONARY STENT INTERVENTION N/A 10/13/2016   Procedure: Coronary Stent Intervention;  Surgeon: Troy Sine, MD;  Location: [place] CV LAB;  Service: Cardiovascular;  Laterality: N/A;  . DIALYSIS/PERMA CATHETER REMOVAL N/A 04/18/2017   Procedure: DIALYSIS/PERMA CATHETER REMOVAL;  Surgeon: Katha Cabal, MD;  Location: [place] CV LAB;  Service: Cardiovascular;  Laterality: N/A;  . DILATION AND CURETTAGE OF UTERUS    . ESOPHAGOGASTRODUODENOSCOPY  01/20/2012   Procedure: ESOPHAGOGASTRODUODENOSCOPY (EGD);  Surgeon: Ladene Artist, MD,FACG;  Location: [facility] ENDOSCOPY;  Service: Endoscopy;  Laterality: N/A;  . ESOPHAGOGASTRODUODENOSCOPY N/A 03/26/2013   Procedure: ESOPHAGOGASTRODUODENOSCOPY (EGD);  Surgeon: Irene Shipper, MD;  Location: [facility] ENDOSCOPY;  Service: Endoscopy;  Laterality: N/A;  . ESOPHAGOGASTRODUODENOSCOPY N/A 04/30/2015   Procedure: ESOPHAGOGASTRODUODENOSCOPY (EGD);  Surgeon: Rogene Houston, MD;  [facility];  Service: Endoscopy;  Laterality: N/A;  1pm - moved to 10/20 @ 1:10  . ESOPHAGOGASTRODUODENOSCOPY N/A 07/29/2016   Procedure: ESOPHAGOGASTRODUODENOSCOPY (EGD);  Surgeon: Manus Gunning, MD;  Location: [place];  Service: Gastroenterology;  Laterality: N/A;  enteroscopy  . GIVENS CAPSULE STUDY N/A 03/07/2019   Procedure: GIVENS CAPSULE STUDY;  Surgeon: Rogene Houston, MD;  [facility];  Service: Endoscopy;  Laterality: N/A;  7:30  . INTRAOPERATIVE TRANSESOPHAGEAL ECHOCARDIOGRAM   06/13/2012   Procedure: INTRAOPERATIVE TRANSESOPHAGEAL ECHOCARDIOGRAM;  Surgeon: Grace Isaac, MD;  Location: [place];  Service: Open Heart Surgery;  Laterality: N/A;  . IR GENERIC HISTORICAL  07/26/2016   IR FLUORO GUIDE CV LINE RIGHT 07/26/2016 Greggory Keen, MD [facility]INTER[facility] RAD  . IR GENERIC HISTORICAL  07/26/2016   IR US GUIDE VASC ACCESS RIGHT 07/26/2016 Greggory Keen, MD [facility]  . IR GENERIC HISTORICAL  08/02/2016   IR US GUIDE VASC ACCESS RIGHT 08/02/2016 Greggory Keen, MD [facility]  . IR GENERIC HISTORICAL  08/02/2016   IR FLUORO GUIDE CV LINE RIGHT 08/02/2016 Greggory Keen, MD [facility]  . IR RADIOLOGY PERIPHERAL GUIDED IV START  03/28/2017  . IR US GUIDE VASC ACCESS RIGHT  03/28/2017  . LEFT HEART CATH AND CORONARY ANGIOGRAPHY N/A 09/20/2016   Procedure: Left Heart Cath and Coronary Angiography;  Surgeon: [name], MD;  Location: [place] CV LAB;  Service: Cardiovascular;  Laterality: N/A;  . LEFT HEART CATH AND CORS/GRAFTS ANGIOGRAPHY N/A 10/13/2016   Procedure: Left Heart Cath and Cors/Grafts Angiography;  Surgeon: Troy Sine, MD;  Location: [place] CV LAB;  Service: Cardiovascular;  Laterality: N/A;  . LEFT HEART CATH AND CORS/GRAFTS ANGIOGRAPHY N/A 07/13/2018   Procedure: LEFT HEART CATH AND CORS/GRAFTS ANGIOGRAPHY;  Surgeon: Martinique, Peter M, MD;  Location: [place] CV LAB;  Service: Cardiovascular;  Laterality: N/A;  . LEFT HEART CATHETERIZATION WITH CORONARY ANGIOGRAM N/A 12/15/2011   Procedure: LEFT HEART CATHETERIZATION WITH CORONARY ANGIOGRAM;  Surgeon: Burnell Blanks, MD;  Location: [facility] CATH LAB;  Service: Cardiovascular;  Laterality: N/A;  . LEFT HEART CATHETERIZATION WITH CORONARY ANGIOGRAM N/A 01/10/2012   Procedure: LEFT HEART CATHETERIZATION WITH CORONARY ANGIOGRAM;  Surgeon: Peter M Martinique, MD;  Location: [facility] CATH LAB;  Service: Cardiovascular;  Laterality: N/A;  . LEFT HEART CATHETERIZATION WITH CORONARY ANGIOGRAM N/A 06/08/2012   Procedure: LEFT  HEART CATHETERIZATION WITH CORONARY ANGIOGRAM;  Surgeon: Burnell Blanks, MD;  Location: [facility] CATH LAB;  Service: Cardiovascular;  Laterality: N/A;  . LEFT HEART CATHETERIZATION WITH CORONARY/GRAFT ANGIOGRAM N/A 12/10/2013   Procedure: LEFT HEART CATHETERIZATION WITH Beatrix Fetters;  Surgeon: [name], MD;  Location: [facility] CATH LAB;  Service: Cardiovascular;  Laterality: N/A;  . OVARY SURGERY     ovarian cancer  . REVISION OF ARTERIOVENOUS GORETEX GRAFT N/A 02/24/2017   Procedure: REVISION OF ARTERIOVENOUS GORETEX GRAFT (RESECTION);  Surgeon: Katha Cabal, MD;  Location: [facility] ORS;  Service: Vascular;  Laterality: N/A;  [place] N/A 10/15/2013   Procedure: Fistulogram;  Surgeon: Serafina Mitchell, MD;  Location: [facility] CATH LAB;  Service: Cardiovascular;  Laterality: N/A;  . THROMBECTOMY / ARTERIOVENOUS GRAFT REVISION  2011   left upper arm  . TUBAL LIGATION  1980's  . UPPER EXTREMITY ANGIOGRAPHY Bilateral 12/06/2016   Procedure: Upper Extremity Angiography;  Surgeon: Katha Cabal, MD;  Location: [place] CV LAB;  Service: Cardiovascular;  Laterality: Bilateral;  . UPPER EXTREMITY INTERVENTION Left 06/06/2017   Procedure: UPPER EXTREMITY INTERVENTION;  Surgeon: Katha Cabal, MD;  Location: [place] CV LAB;  Service: Cardiovascular;  Laterality: Left;    Prior to Admission medications   Medication Sig Start Date End Date Taking? Authorizing Provider  ALPRAZolam Duanne Moron) 0.5 MG tablet Take 0.5 mg by mouth daily. 10/16/18   [provider]  amiodarone (PACERONE) 200 MG tablet Take 1 tablet (200 mg total) by mouth daily. 02/19/19   Herminio Commons, MD  aspirin EC 81 MG tablet Take 81 mg by mouth daily.    [provider]  carvedilol (COREG) 12.5 MG tablet Take 1 tablet (12.5 mg total) by mouth 2 (two) times daily with a meal for 30 days. Additional refills per PCP 12/27/18 01/26/19  Donne Hazel, MD  cloNIDine (CATAPRES) 0.1 MG tablet  Take 1 tablet (0.1 mg total) by mouth 2 (two) times daily. 02/01/19   Daune Perch, NP  diphenhydrAMINE (BENADRYL) 25 mg capsule Take 50 mg by mouth daily as needed for itching or allergies.     [provider]  fluticasone (FLONASE) 50 MCG/ACT nasal spray Place 1 spray at bedtime as needed into both nostrils for allergies.     [provider]  guaiFENesin-codeine (ROBITUSSIN AC) 100-10 MG/5ML syrup Take 5 mLs by mouth 3 (three) times daily as needed for cough. 02/05/19   Virgel Manifold, MD  isosorbide mononitrate (IMDUR) 60 MG 24 hr tablet Take 1 tablet (60 mg  total) by mouth 2 (two) times a day for 30 days. 12/15/18 03/02/19  Heath Lark D, DO  lidocaine-prilocaine (EMLA) cream Apply 1 application topically every Monday, Wednesday, and Friday. Prior to dialysis    [provider]  loratadine (CLARITIN) 10 MG tablet Take 1 tablet (10 mg total) by mouth daily as needed for allergies. 07/14/18   Hosie Poisson, MD  multivitamin (RENA-VIT) TABS tablet Take 1 tablet by mouth daily.    [provider]  nitroGLYCERIN (NITROSTAT) 0.4 MG SL tablet DISSOLVE ONE TABLET UNDER THE TONGUE EVERY 5 MINUTES AS NEEDED FOR CHEST PAIN.  DO NOT EXCEED A TOTAL OF 3 DOSES IN 15 MINUTES Patient not taking: No sig reported 10/08/18   Herminio Commons, MD  omeprazole (PRILOSEC) 20 MG capsule Take 1 capsule by mouth once daily 01/31/19   Amitai Delaughter, Mechele Dawley, MD  simvastatin (ZOCOR) 20 MG tablet TAKE ONE TABLET (20 MG) BY MOUTH DAILY AT BEDTIME Patient not taking: Reported on 03/05/2019 04/03/18   Herminio Commons, MD  ticagrelor (BRILINTA) 60 MG TABS tablet Take 1 tablet (60 mg total) by mouth 2 (two) times daily. 08/17/18   Almyra Deforest, PA    No current facility-administered medications for this encounter.    No current outpatient medications on file.   Facility-Administered Medications Ordered in Other Encounters  Medication Dose Route Frequency Provider Last Rate Last Dose  . 0.9 %   sodium chloride infusion (Manually program via Guardrails IV Fluids)   Intravenous Once Zierle-Ghosh, Asia B, DO   Stopped at 03/08/19 0222  . 0.9 %  sodium chloride infusion  100 mL Intravenous PRN Dwana Melena, MD      . 0.9 %  sodium chloride infusion  100 mL Intravenous PRN Dwana Melena, MD      . 0.9 %  sodium chloride infusion  100 mL Intravenous PRN Dwana Melena, MD      . 0.9 %  sodium chloride infusion  100 mL Intravenous PRN Dwana Melena, MD      . albuterol (PROVENTIL) (2.5 MG/3ML) 0.083% nebulizer solution 2.5 mg  2.5 mg Nebulization Q2H PRN Zierle-Ghosh, Asia B, DO      . ALPRAZolam (XANAX) tablet 0.5 mg  0.5 mg Oral Daily Zierle-Ghosh, Asia B, DO      . alteplase (CATHFLO ACTIVASE) injection 2 mg  2 mg Intracatheter Once PRN Dwana Melena, MD      . amiodarone (PACERONE) tablet 200 mg  200 mg Oral Daily Zierle-Ghosh, Asia B, DO      . bisacodyl (DULCOLAX) EC tablet 10 mg  10 mg Oral Once Adiana Smelcer U, MD      . carvedilol (COREG) tablet 12.5 mg  12.5 mg Oral BID WC Zierle-Ghosh, Asia B, DO      . Chlorhexidine Gluconate Cloth 2 % PADS 6 each  6 each Topical Q0600 Dwana Melena, MD   6 each at 03/08/19 [phone]  . diphenhydrAMINE (BENADRYL) injection 12.5 mg  12.5 mg Intravenous Q6H PRN Zierle-Ghosh, Asia B, DO      . fluticasone (FLONASE) 50 MCG/ACT nasal spray 1 spray  1 spray Each Nare QHS PRN Zierle-Ghosh, Asia B, DO      . heparin injection 1,000 Units  1,000 Units Dialysis PRN Dwana Melena, MD      . lidocaine (PF) (XYLOCAINE) 1 % injection 5 mL  5 mL Intradermal PRN Dwana Melena, MD      . lidocaine-prilocaine (EMLA) cream  1 application  1 application Topical Q M,W,F Zierle-Ghosh, Asia B, DO      . lidocaine-prilocaine (EMLA) cream 1 application  1 application Topical PRN Dwana Melena, MD      . loratadine (CLARITIN) tablet 10 mg  10 mg Oral Daily PRN Zierle-Ghosh, Asia B, DO      . multivitamin (RENA-VIT) tablet 1 tablet  1 tablet Oral Daily Zierle-Ghosh, Asia B, DO      .  nitroGLYCERIN 50 mg in dextrose 5 % 250 mL (0.2 mg/mL) infusion  0-200 mcg/min Intravenous Titrated Zierle-Ghosh, Asia B, DO 6 mL/hr at 03/08/19 0321 20 mcg/min at 03/08/19 0321  . ondansetron (ZOFRAN) tablet 4 mg  4 mg Oral Q6H PRN Zierle-Ghosh, Asia B, DO       Or  . ondansetron (ZOFRAN) injection 4 mg  4 mg Intravenous Q6H PRN Zierle-Ghosh, Asia B, DO      . pentafluoroprop-tetrafluoroeth (GEBAUERS) aerosol 1 application  1 application Topical PRN Dwana Melena, MD      . simvastatin (ZOCOR) tablet 40 mg  40 mg Oral q1800 Zierle-Ghosh, Asia B, DO        Allergies as of 03/06/2019 - Review Complete 03/05/2019  Allergen Reaction Noted  . Aspirin Other (See Comments) 12/15/2011  . Penicillins Other (See Comments) 12/15/2011  . Amlodipine Swelling 01/19/2015  . Bactrim [sulfamethoxazole-trimethoprim] Rash 12/15/2011  . Contrast media [iodinated diagnostic agents] Itching 12/15/2011  . Iron Itching and Other (See Comments) 05/02/2012  . Nitrofurantoin Hives 05/27/2015  . Tylenol [acetaminophen] Itching and Other (See Comments) 07/08/2016  . Gabapentin Other (See Comments) 02/08/2017  . Hydralazine Itching 12/14/2018  . Dexilant [dexlansoprazole] Other (See Comments) 05/29/2013  . Levaquin [levofloxacin in d5w] Rash 10/04/2013  . Morphine and related Itching and Other (See Comments) 09/16/2014  . Plavix [clopidogrel bisulfate] Rash 01/05/2012  . Protonix [pantoprazole sodium] Rash 03/21/2013  . Venofer [ferric oxide] Itching and Other (See Comments) 05/02/2012    Family History  Problem Relation Age of Onset  . Heart disease Mother        Heart Disease before age 33  . Hyperlipidemia Mother   . Hypertension Mother   . Diabetes Mother   . Heart attack Mother   . Heart disease Father        Heart Disease before age 28  . Hyperlipidemia Father   . Hypertension Father   . Diabetes Father   . Diabetes Sister   . Hypertension Sister   . Diabetes Brother   . Hyperlipidemia Brother    . Heart attack Brother   . Hypertension Sister   . Heart attack Brother   . Other Other        noncontributory for early CAD  . Colon cancer Neg Hx   . Esophageal cancer Neg Hx   . Liver disease Neg Hx   . Kidney disease Neg Hx   . Colon polyps Neg Hx     Social History   Socioeconomic History  . Marital status: Married    Spouse name: Not on file  . Number of children: Not on file  . Years of education: Not on file  . Highest education level: Not on file  Occupational History  . Not on file  Social Needs  . Financial resource strain: Not very hard  . Food insecurity    Worry: Never true    Inability: Never true  . Transportation needs    Medical: No    Non-medical: No  Tobacco Use  .  Smoking status: Never Smoker  . Smokeless tobacco: Never Used  Substance and Sexual Activity  . Alcohol use: No    Alcohol/week: 0.0 standard drinks  . Drug use: No  . Sexual activity: Yes    Birth control/protection: Surgical  Lifestyle  . Physical activity    Days per week: Patient refused    Minutes per session: Patient refused  . Stress: Only a little  Relationships  . Social Herbalist on phone: Patient refused    Gets together: Patient refused    Attends religious service: Patient refused    Active member of club or organization: Patient refused    Attends meetings of clubs or organizations: Patient refused    Relationship status: Patient refused  . Intimate partner violence    Fear of current or ex partner: No    Emotionally abused: No    Physically abused: No    Forced sexual activity: No  Other Topics Concern  . Not on file  Social History Narrative   Lives in [place], [place] with husband.  Dialysis pt - [facility].    Review of Systems: See HPI, otherwise normal ROS  Physical Exam: Temp:  [97.5 F (36.4 C)-99.4 F (37.4 C)] 97.6 F (36.4 C) (08/28 1330) Pulse Rate:  [63-118] 67 (08/28 1430) Resp:  [12-31] 14 (08/28 1430) BP: (126-230)/(46-96) 170/59  (08/28 1430) SpO2:  [95 %-100 %] 100 % (08/28 1215) Weight:  [56 kg-56.7 kg] 56.7 kg (08/28 1050)   Patient is alert and in no acute distress. Conjunctiva is pale and sclera nonicteric. Oropharyngeal mucosa is normal. No neck masses or thyromegaly noted. He has midsternal scar. Cardiac exam with regular rhythm normal S1 and S2.  Faint systolic murmur noted inside apex. Auscultation lungs reveal vesicular breath sounds bilaterally. Abdomen is symmetrical.  She has lower midline scar.  Bowel sounds are normal.  No bruit noted.  On palpation abdomen is soft with mild midepigastric tenderness.  No organomegaly or masses. Peripheral edema or clubbing noted. She has AV fistula in left arm.   Lab Results: Recent Labs    03/07/19 2356 03/08/19 0512 03/08/19 0954  WBC 7.6 11.7* 7.1  HGB 7.3* 7.5* 6.8*  HCT 24.0* 23.5* 23.2*  PLT 223 215 216   BMET Recent Labs    03/07/19 2356 03/08/19 0954  NA 142 144  143  K 4.2 4.8  4.8  CL 100 104  103  CO2 28 27  27   GLUCOSE 129* 103*  103*  BUN 35* 41*  39*  CREATININE 6.89* 7.98*  7.99*  CALCIUM 9.3 9.0  9.0   LFT Recent Labs    03/08/19 0954  PROT 5.5*  ALBUMIN 3.0*  3.0*  AST 9*  ALT 8  ALKPHOS 54  BILITOT 0.5   PT/INR Recent Labs    03/07/19 2356 03/08/19 0954  LABPROT 13.7 14.3  INR 1.1 1.1   Hepatitis Panel No results for input(s): HEPBSAG, HCVAB, HEPAIGM, HEPBIGM in the last 72 hours.  Studies/Results: Dg Chest Port 1 View  Result Date: 03/08/2019 CLINICAL DATA:  Lower GI bleed EXAM: PORTABLE CHEST 1 VIEW COMPARISON:  03/02/2019 FINDINGS: Small bilateral pleural effusions. Cardiomegaly with vascular congestion and diffuse interstitial opacity compatible with edema. Basilar consolidations. No pneumothorax. Post sternotomy changes. IMPRESSION: 1. Cardiomegaly with worsened vascular congestion and increased interstitial edema. Interim development of small bilateral pleural effusions and basilar atelectasis or  pneumonia Electronically Signed   By: Donavan Foil M.D.   On: 03/08/2019  02:00    Assessment;  #1.  Acute on chronic GI bleed in a patient with multiple comorbidities who is on low-dose aspirin and Brilinta.  Small bowel given capsule study was performed on an outpatient basis yesterday revealing 2 nonbleeding jejunal AV malformations and a single jejunal diverticulum. She now reports passing dark blood and not necessarily melanic stool.  She therefore could be losing blood from her lower GI tract.  She has a history of colon carcinoma with surgery 1992 and no recent exam.  Last colonoscopy was in 2013.  Therefore colonoscopy would be recommended to look for cause of GI blood loss. Aspirin and Brilinta are on hold. Patient also has abdominal pain which may be due to abdominal angina as she has significant disease to celiac trunk SMA and IMA.  #2. Coronary artery disease.  Patient is status post CABG in 2013 and she suffered non-STEMI in April 2018 and underwent stent placement.  She was felt to be in pulmonary edema on admission but not having a respiratory issues post hemodialysis.  #3. End-stage renal disease on dialysis.  Patient finishing hemodialysis at bedside.  #4. paroxysmal atrial fibrillation  Recommendations;  Diet changed to clear liquids. Dulcolax 10 mg p.o. tonight. GoLYTELY prep starting at 6 AM on 03/09/2019. She will undergo diagnostic colonoscopy on 03/09/2019.   LOS: 0 days   Jasimine Simms  03/08/2019, 3:32 PM

## 2019-03-08 NOTE — Progress Notes (Signed)
Ortho VS  lying165/51 77 Sitting 164/69 76 Standing 175/58 81

## 2019-03-08 NOTE — Progress Notes (Signed)
CRITICAL VALUE ALERT  Critical Value:  HGB 6.8  Date & Time Notied:  03/08/2019 @ 4471  Provider Notified: Dr. Carles Collet  Orders Received/Actions taken: Will transfuse blood.

## 2019-03-08 NOTE — Op Note (Signed)
Small Bowel [place] Capsule Study See other report

## 2019-03-08 NOTE — Consult Note (Signed)
Reason for Consult: ESRD Referring Physician:  Dr. Carles Collet  Chief Complaint: Black stool  Assessment/Plan: 1. ESRD - MWF [facility]; plan on HD today. Should be able to hold off till Monday to resume MWF regimen. Not planning on HD tomorrow.  - Seen on HD through the left arm fistula. VSS with no complaints 3K bath in anticipation of 2U PRBC's. 2. Renal osteodystrophy - Continue binder as she resumes PO. 3. Anemia - Being transfused on HD today. GI being consulted. 4. pAfib 5. HTN - actually sometimes drops on HD but doesn't appear to be on Midodrine.  Dr. Joelyn Oms and Dr. Othelia Pulling over the weekend; will plan on writing orders for HD (Monday) if she's still here but otherwise please call with any questions over the weekend.  HPI: Shawna [name] is an 78 y.o. female ESRD HD [place] with Dr. [place] Myrtle MWF with last treatment Wed, where she left at 56.2kg (EDW is listed as 56.5kg). She is here with symptomatic anemia with history of dark tarry stool for 2 weeks with more frequency of events over past 2 days including hematochezia yesterday. For the 1st time she felt dizzy and weak yesterday but did not sustain any syncopal episodes or trauma. She has a h/o heme occult+ with GI follow up and had a capsule endoscopy 03/07/19. In the ED her Hb was 7.3 with associated dyspnea and CXR showing pulmonary edema.  ROS Pertinent items are noted in HPI.  Chemistry and CBC: Creat  Date/Time Value Ref Range Status  03/05/2019 11:59 AM 6.57 (H) 0.60 - 0.93 mg/dL Final    Comment:    For patients >45 years of age, the reference limit for Creatinine is approximately 13% higher for people identified as African-American. .    Creatinine, Ser  Date/Time Value Ref Range Status  03/08/2019 09:54 AM 7.98 (H) 0.44 - 1.00 mg/dL Final  03/08/2019 09:54 AM 7.99 (H) 0.44 - 1.00 mg/dL Final  03/07/2019 11:56 PM 6.89 (H) 0.44 - 1.00 mg/dL Final  03/02/2019 12:03 PM 6.61 (H) 0.44 - 1.00 mg/dL Final  12/26/2018  08:20 PM 3.90 (H) 0.44 - 1.00 mg/dL Final    Comment:    DIALYSIS  12/26/2018 02:45 PM 8.99 (H) 0.44 - 1.00 mg/dL Final  12/25/2018 10:16 PM 7.47 (H) 0.44 - 1.00 mg/dL Final  12/15/2018 04:03 AM 4.75 (H) 0.44 - 1.00 mg/dL Final    Comment:    DELTA CHECK NOTED  12/14/2018 04:11 PM 9.20 (H) 0.44 - 1.00 mg/dL Final  12/14/2018 04:59 AM 7.93 (H) 0.44 - 1.00 mg/dL Final  12/14/2018 01:43 AM 7.62 (H) 0.44 - 1.00 mg/dL Final  07/14/2018 02:00 AM 9.94 (H) 0.44 - 1.00 mg/dL Final  07/13/2018 02:03 AM 7.98 (H) 0.44 - 1.00 mg/dL Final  02/13/2018 12:08 PM 6.88 (H) 0.44 - 1.00 mg/dL Final  12/20/2017 12:26 AM 7.38 (H) 0.44 - 1.00 mg/dL Final  10/16/2017 05:39 AM 8.94 (H) 0.44 - 1.00 mg/dL Final  10/15/2017 04:20 AM 6.90 (H) 0.44 - 1.00 mg/dL Final  08/13/2017 09:54 AM 8.53 (H) 0.44 - 1.00 mg/dL Final  07/15/2017 06:48 AM 4.92 (H) 0.44 - 1.00 mg/dL Final    Comment:    DELTA CHECK NOTED  07/14/2017 02:25 AM 8.06 (H) 0.44 - 1.00 mg/dL Final  07/01/2017 01:24 PM 6.60 (H) 0.44 - 1.00 mg/dL Final  04/05/2017 02:58 AM 8.04 (H) 0.44 - 1.00 mg/dL Final  04/01/2017 02:28 AM 4.46 (H) 0.44 - 1.00 mg/dL Final    Comment:  DELTA CHECK NOTED  03/31/2017 07:53 AM 6.81 (H) 0.44 - 1.00 mg/dL Final  03/29/2017 03:19 PM 7.61 (H) 0.44 - 1.00 mg/dL Final  03/27/2017 01:15 PM 8.66 (H) 0.44 - 1.00 mg/dL Final  03/27/2017 02:41 AM 7.82 (H) 0.44 - 1.00 mg/dL Final  03/26/2017 08:01 AM 5.89 (H) 0.44 - 1.00 mg/dL Final  03/25/2017 06:13 AM 6.08 (H) 0.44 - 1.00 mg/dL Final  03/24/2017 05:18 AM 8.15 (H) 0.44 - 1.00 mg/dL Final  03/23/2017 05:19 AM 5.84 (H) 0.44 - 1.00 mg/dL Final  03/22/2017 06:02 AM 8.26 (H) 0.44 - 1.00 mg/dL Final  02/09/2017 06:09 AM 10.35 (H) 0.44 - 1.00 mg/dL Final  02/09/2017 03:41 AM 10.20 (H) 0.44 - 1.00 mg/dL Final  02/08/2017 12:05 PM 8.95 (H) 0.44 - 1.00 mg/dL Final  01/24/2017 01:26 PM 7.27 (H) 0.44 - 1.00 mg/dL Final  01/20/2017 02:53 AM 7.53 (H) 0.44 - 1.00 mg/dL Final  11/17/2016  10:06 PM 7.89 (H) 0.44 - 1.00 mg/dL Final  10/25/2016 07:48 AM 4.70 (H) 0.44 - 1.00 mg/dL Final    Comment:    DELTA CHECK NOTED  10/24/2016 05:00 PM 2.88 (H) 0.44 - 1.00 mg/dL Final  10/14/2016 02:50 AM 7.01 (H) 0.44 - 1.00 mg/dL Final  10/12/2016 04:45 AM 5.22 (H) 0.44 - 1.00 mg/dL Final  10/11/2016 02:19 AM 6.71 (H) 0.44 - 1.00 mg/dL Final    Comment:    DELTA CHECK NOTED  10/10/2016 04:50 AM 10.41 (H) 0.44 - 1.00 mg/dL Final  10/09/2016 11:25 PM 9.96 (H) 0.44 - 1.00 mg/dL Final  10/02/2016 11:59 PM 10.00 (H) 0.44 - 1.00 mg/dL Final  09/21/2016 03:30 PM 9.17 (H) 0.44 - 1.00 mg/dL Final  09/20/2016 05:12 PM 7.31 (H) 0.44 - 1.00 mg/dL Final  09/20/2016 03:47 AM 5.63 (H) 0.44 - 1.00 mg/dL Final    Comment:    DELTA CHECK NOTED  09/19/2016 11:40 AM 10.57 (H) 0.44 - 1.00 mg/dL Final  09/07/2016 01:35 AM 9.48 (H) 0.44 - 1.00 mg/dL Final  07/31/2016 05:20 AM 4.27 (H) 0.44 - 1.00 mg/dL Final   Recent Labs  Lab 03/02/19 1203 03/05/19 1159 03/07/19 2356 03/08/19 0954  NA 138 139 142 144  143  K 4.0 4.1 4.2 4.8  4.8  CL 96* 98 100 104  103  CO2 29 30 28 27  27   GLUCOSE 103* 111 129* 103*  103*  BUN 38* 35* 35* 41*  39*  CREATININE 6.61* 6.57* 6.89* 7.98*  7.99*  CALCIUM 9.3 9.1 9.3 9.0  9.0  PHOS  --   --   --  5.9*   Recent Labs  Lab 03/05/19 1159 03/07/19 2356 03/08/19 0512 03/08/19 0954  WBC 6.9 7.6 11.7* 7.1  NEUTROABS 5,134 5.9 9.5*  --   HGB 8.6* 7.3* 7.5* 6.8*  HCT 26.9* 24.0* 23.5* 23.2*  MCV 106.3* 114.3* 107.8* 114.9*  PLT 232 223 215 216   Liver Function Tests: Recent Labs  Lab 03/05/19 1159 03/07/19 2356 03/08/19 0954  AST 4* 9* 9*  ALT 4* 9 8  ALKPHOS  --  59 54  BILITOT 0.7 0.5 0.5  PROT 5.6* 6.1* 5.5*  ALBUMIN  --  3.4* 3.0*  3.0*   No results for input(s): LIPASE, AMYLASE in the last 168 hours. No results for input(s): AMMONIA in the last 168 hours. Cardiac Enzymes: No results for input(s): CKTOTAL, CKMB, CKMBINDEX, TROPONINI in the  last 168 hours. Iron Studies:  Recent Labs    03/05/19 1159  IRON 70  TIBC  179*  FERRITIN 998*   PT/INR: @LABRCNTIP (inr:5)  Xrays/Other Studies: ) Results for orders placed or performed during the hospital encounter of 03/07/19 (from the past 48 hour(s))  CBC with Differential/Platelet     Status: Abnormal   Collection Time: 03/07/19 11:56 PM  Result Value Ref Range   WBC 7.6 4.0 - 10.5 K/uL   RBC 2.10 (L) 3.87 - 5.11 MIL/uL   Hemoglobin 7.3 (L) 12.0 - 15.0 g/dL   HCT 24.0 (L) 36.0 - 46.0 %   MCV 114.3 (H) 80.0 - 100.0 fL   MCH 34.8 (H) 26.0 - 34.0 pg   MCHC 30.4 30.0 - 36.0 g/dL   RDW 17.9 (H) 11.5 - 15.5 %   Platelets 223 150 - 400 K/uL   nRBC 0.0 0.0 - 0.2 %   Neutrophils Relative % 77 %   Neutro Abs 5.9 1.7 - 7.7 K/uL   Lymphocytes Relative 9 %   Lymphs Abs 0.7 0.7 - 4.0 K/uL   Monocytes Relative 11 %   Monocytes Absolute 0.8 0.1 - 1.0 K/uL   Eosinophils Relative 3 %   Eosinophils Absolute 0.2 0.0 - 0.5 K/uL   Basophils Relative 0 %   Basophils Absolute 0.0 0.0 - 0.1 K/uL   Immature Granulocytes 0 %   Abs Immature Granulocytes 0.02 0.00 - 0.07 K/uL    Comment: Performed at [facility], [address] [place], [place] [address]  Comprehensive metabolic panel     Status: Abnormal   Collection Time: 03/07/19 11:56 PM  Result Value Ref Range   Sodium 142 135 - 145 mmol/L   Potassium 4.2 3.5 - 5.1 mmol/L   Chloride 100 98 - 111 mmol/L   CO2 28 22 - 32 mmol/L   Glucose, Bld 129 (H) 70 - 99 mg/dL   BUN 35 (H) 8 - 23 mg/dL   Creatinine, Ser 6.89 (H) 0.44 - 1.00 mg/dL   Calcium 9.3 8.9 - 10.3 mg/dL   Total Protein 6.1 (L) 6.5 - 8.1 g/dL   Albumin 3.4 (L) 3.5 - 5.0 g/dL   AST 9 (L) 15 - 41 U/L   ALT 9 0 - 44 U/L   Alkaline Phosphatase 59 38 - 126 U/L   Total Bilirubin 0.5 0.3 - 1.2 mg/dL   GFR calc non Af Amer 5 (L) >60 mL/min   GFR calc Af Amer 6 (L) >60 mL/min   Anion gap 14 5 - 15    Comment: Performed at [facility], [address] [place], [place]  [address]  Type and screen     Status: None (Preliminary result)   Collection Time: 03/07/19 11:56 PM  Result Value Ref Range   ABO/RH(D) O POS    Antibody Screen NEG    Sample Expiration 03/10/2019,2359    Unit Number [id number]    Blood Component Type RED CELLS,LR    Unit division 00    Status of Unit ALLOCATED    Donor AG Type NEGATIVE FOR KIDD B ANTIGEN    Transfusion Status OK TO TRANSFUSE    Crossmatch Result COMPATIBLE    Unit Number [id number]    Blood Component Type RED CELLS,LR    Unit division 00    Status of Unit ALLOCATED    Donor AG Type NEGATIVE FOR KIDD B ANTIGEN    Transfusion Status OK TO TRANSFUSE    Crossmatch Result COMPATIBLE   Protime-INR     Status: None   Collection Time: 03/07/19 11:56 PM  Result Value Ref Range  Prothrombin Time 13.7 11.4 - 15.2 seconds   INR 1.1 0.8 - 1.2    Comment: (NOTE) INR goal varies based on device and disease states. Performed at [facility], [address] [place], [place] [address]   PTT     Status: None   Collection Time: 03/07/19 11:56 PM  Result Value Ref Range   aPTT 32 24 - 36 seconds    Comment: Performed at [facility], [address] [place], [name] [address]  Fibrinogen     Status: Abnormal   Collection Time: 03/07/19 11:56 PM  Result Value Ref Range   Fibrinogen 481 (H) 210 - 475 mg/dL    Comment: Performed at [facility], [address] [place], [place] [address]  Prepare RBC     Status: None   Collection Time: 03/07/19 11:56 PM  Result Value Ref Range   Order Confirmation      ORDER PROCESSED BY BLOOD BANK Performed at [facility], [address] [place], [place] [address]   SARS Coronavirus 2 [facility] order, Performed in [facility] hospital lab) Nasopharyngeal Nasopharyngeal Swab     Status: None   Collection Time: 03/08/19 12:55 AM   Specimen: Nasopharyngeal Swab  Result Value Ref Range   SARS Coronavirus 2 NEGATIVE NEGATIVE    Comment: (NOTE) If result is NEGATIVE SARS-CoV-2 target  nucleic acids are NOT DETECTED. The SARS-CoV-2 RNA is generally detectable in upper and lower  respiratory specimens during the acute phase of infection. The lowest  concentration of SARS-CoV-2 viral copies this assay can detect is 250  copies / mL. A negative result does not preclude SARS-CoV-2 infection  and should not be used as the sole basis for treatment or other  patient management decisions.  A negative result may occur with  improper specimen collection / handling, submission of specimen other  than nasopharyngeal swab, presence of viral mutation(s) within the  areas targeted by this assay, and inadequate number of viral copies  (<250 copies / mL). A negative result must be combined with clinical  observations, patient history, and epidemiological information. If result is POSITIVE SARS-CoV-2 target nucleic acids are DETECTED. The SARS-CoV-2 RNA is generally detectable in upper and lower  respiratory specimens dur ing the acute phase of infection.  Positive  results are indicative of active infection with SARS-CoV-2.  Clinical  correlation with patient history and other diagnostic information is  necessary to determine patient infection status.  Positive results do  not rule out bacterial infection or co-infection with other viruses. If result is PRESUMPTIVE POSTIVE SARS-CoV-2 nucleic acids MAY BE PRESENT.   A presumptive positive result was obtained on the submitted specimen  and confirmed on repeat testing.  While 2019 novel coronavirus  (SARS-CoV-2) nucleic acids may be present in the submitted sample  additional confirmatory testing may be necessary for epidemiological  and / or clinical management purposes  to differentiate between  SARS-CoV-2 and other Sarbecovirus currently known to infect humans.  If clinically indicated additional testing with an alternate test  methodology [phone]) is advised. The SARS-CoV-2 RNA is generally  detectable in upper and lower  respiratory sp ecimens during the acute  phase of infection. The expected result is Negative. Fact Sheet for Patients:  StrictlyIdeas.no Fact Sheet for Healthcare Providers: BankingDealers.co.za This test is not yet approved or cleared by the [place] FDA and has been authorized for detection and/or diagnosis of SARS-CoV-2 by FDA under an Emergency Use Authorization (EUA).  This EUA will remain in effect (  meaning this test can be used) for the duration of the COVID-19 declaration under Section 564(b)(1) of the Act, 21 U.S.C. section 360bbb-3(b)(1), unless the authorization is terminated or revoked sooner. Performed at [facility], [address], [place], [place] [address]   Blood gas, arterial (at [facility])     Status: Abnormal   Collection Time: 03/08/19  1:44 AM  Result Value Ref Range   FIO2 32.00    pH, Arterial 7.326 (L) 7.350 - 7.450   pCO2 arterial 49.4 (H) 32.0 - 48.0 mmHg   pO2, Arterial 67.3 (L) 83.0 - 108.0 mmHg   Bicarbonate 23.9 20.0 - 28.0 mmol/L   Acid-base deficit 0.1 0.0 - 2.0 mmol/L   O2 Saturation 87.8 %   Patient temperature 37.0    Drawn by [id number]    Allens test (pass/fail) PASS PASS    Comment: Performed at [facility], [address] [place], [place] [address]  CBC with Differential/Platelet     Status: Abnormal   Collection Time: 03/08/19  5:12 AM  Result Value Ref Range   WBC 11.7 (H) 4.0 - 10.5 K/uL   RBC 2.18 (L) 3.87 - 5.11 MIL/uL   Hemoglobin 7.5 (L) 12.0 - 15.0 g/dL   HCT 23.5 (L) 36.0 - 46.0 %   MCV 107.8 (H) 80.0 - 100.0 fL   MCH 34.4 (H) 26.0 - 34.0 pg   MCHC 31.9 30.0 - 36.0 g/dL   RDW 17.7 (H) 11.5 - 15.5 %   Platelets 215 150 - 400 K/uL   nRBC 0.3 (H) 0.0 - 0.2 %   Neutrophils Relative % 81 %   Neutro Abs 9.5 (H) 1.7 - 7.7 K/uL   Lymphocytes Relative 5 %   Lymphs Abs 0.6 (L) 0.7 - 4.0 K/uL   Monocytes Relative 8 %   Monocytes Absolute 0.9 0.1 - 1.0 K/uL   Eosinophils Relative 1 %    Eosinophils Absolute 0.1 0.0 - 0.5 K/uL   Basophils Relative 0 %   Basophils Absolute 0.0 0.0 - 0.1 K/uL   Immature Granulocytes 5 %   Abs Immature Granulocytes 0.52 (H) 0.00 - 0.07 K/uL    Comment: Performed at [facility], [address], [place], [place] [address]  Renal function panel     Status: Abnormal   Collection Time: 03/08/19  9:54 AM  Result Value Ref Range   Sodium 144 135 - 145 mmol/L   Potassium 4.8 3.5 - 5.1 mmol/L   Chloride 104 98 - 111 mmol/L   CO2 27 22 - 32 mmol/L   Glucose, Bld 103 (H) 70 - 99 mg/dL   BUN 41 (H) 8 - 23 mg/dL   Creatinine, Ser 7.98 (H) 0.44 - 1.00 mg/dL   Calcium 9.0 8.9 - 10.3 mg/dL   Phosphorus 5.9 (H) 2.5 - 4.6 mg/dL   Albumin 3.0 (L) 3.5 - 5.0 g/dL   GFR calc non Af Amer 4 (L) >60 mL/min   GFR calc Af Amer 5 (L) >60 mL/min   Anion gap 13 5 - 15    Comment: Performed at [facility], [address] [place], [place] [address]  CBC     Status: Abnormal   Collection Time: 03/08/19  9:54 AM  Result Value Ref Range   WBC 7.1 4.0 - 10.5 K/uL   RBC 2.02 (L) 3.87 - 5.11 MIL/uL   Hemoglobin 6.8 (LL) 12.0 - 15.0 g/dL    Comment: REPEATED TO VERIFY THIS CRITICAL RESULT HAS VERIFIED AND BEEN CALLED TO  A [place] BY HILLARY FLYNT ON 08 28 2020 AT 1026, AND HAS BEEN READ BACK.     HCT 23.2 (L) 36.0 - 46.0 %   MCV 114.9 (H) 80.0 - 100.0 fL   MCH 33.7 26.0 - 34.0 pg   MCHC 29.3 (L) 30.0 - 36.0 g/dL   RDW 17.7 (H) 11.5 - 15.5 %   Platelets 216 150 - 400 K/uL   nRBC 0.0 0.0 - 0.2 %    Comment: Performed at [facility], [address] [place], [place] [address]  Comprehensive metabolic panel     Status: Abnormal   Collection Time: 03/08/19  9:54 AM  Result Value Ref Range   Sodium 143 135 - 145 mmol/L   Potassium 4.8 3.5 - 5.1 mmol/L   Chloride 103 98 - 111 mmol/L   CO2 27 22 - 32 mmol/L   Glucose, Bld 103 (H) 70 - 99 mg/dL   BUN 39 (H) 8 - 23 mg/dL   Creatinine, Ser 7.99 (H) 0.44 - 1.00 mg/dL   Calcium 9.0 8.9 - 10.3 mg/dL   Total Protein 5.5  (L) 6.5 - 8.1 g/dL   Albumin 3.0 (L) 3.5 - 5.0 g/dL   AST 9 (L) 15 - 41 U/L   ALT 8 0 - 44 U/L   Alkaline Phosphatase 54 38 - 126 U/L   Total Bilirubin 0.5 0.3 - 1.2 mg/dL   GFR calc non Af Amer 4 (L) >60 mL/min   GFR calc Af Amer 5 (L) >60 mL/min   Anion gap 13 5 - 15    Comment: Performed at [facility], [address] [place], [place] [address]  Protime-INR     Status: None   Collection Time: 03/08/19  9:54 AM  Result Value Ref Range   Prothrombin Time 14.3 11.4 - 15.2 seconds   INR 1.1 0.8 - 1.2    Comment: (NOTE) INR goal varies based on device and disease states. Performed at [facility], [address], [place], [place] [address]    Dg Chest Port 1 View  Result Date: 03/08/2019 CLINICAL DATA:  Lower GI bleed EXAM: PORTABLE CHEST 1 VIEW COMPARISON:  03/02/2019 FINDINGS: Small bilateral pleural effusions. Cardiomegaly with vascular congestion and diffuse interstitial opacity compatible with edema. Basilar consolidations. No pneumothorax. Post sternotomy changes. IMPRESSION: 1. Cardiomegaly with worsened vascular congestion and increased interstitial edema. Interim development of small bilateral pleural effusions and basilar atelectasis or pneumonia Electronically Signed   By: Donavan Foil M.D.   On: 03/08/2019 02:00    PMH:   Past Medical History:  Diagnosis Date  . Acute on chronic respiratory failure with hypoxia ([place]) 10/10/2016  . Anxiety   . Arthritis   . AVM (arteriovenous malformation) of colon   . CAD (coronary artery disease)    a. s/p CABG in 2013 b. most recent DES to D1 in 10/2016 with cath showing patent LIMA-LAD, SVG-RI, SVG-Mrg and SVG-RCA  c. NST in 02/2018 showing scar with no significant ischemia  . Carotid artery disease ([place])    a. 48-18% LICA, 11/6312   . Chronic bronchitis ([place])   . Chronic diastolic CHF (congestive heart failure) ([place])    a. 02/2012 Echo EF 60-65%, nl wall motion, Gr 1 DD, mod MR  . Colon cancer ([place]) 1992  . Esophageal stricture    . ESRD on hemodialysis ([place])    ESRD due to HTN, started dialysis 2011 and gets HD at [facility] with Dr Hinda Lenis on MWF schedule.  Access  is LUA AVF as of Sept 2014.   Marland Kitchen GERD (gastroesophageal reflux disease)   . High cholesterol 12/2011  . History of blood transfusion 07/2011; 12/2011; 01/2012 X 2; 04/2012  . History of gout   . History of lower GI bleeding   . Hypertension   . Iron deficiency anemia   . Mitral regurgitation    a. Moderate by echo, 02/2012  . Myocardial infarction ([place])   . Ovarian cancer ([place]) 1992  . Pneumonia ~ 2009  . PUD (peptic ulcer disease)   . TIA (transient ischemic attack)     PSH:   Past Surgical History:  Procedure Laterality Date  . ABDOMINAL HYSTERECTOMY  1992  . APPENDECTOMY  06/1990  . AV FISTULA PLACEMENT  07/2009   left upper arm  . AV FISTULA PLACEMENT Right 09/06/2016   Procedure: RIGHT FOREARM ARTERIOVENOUS (AV) GRAFT;  Surgeon: Elam Dutch, MD;  Location: [facility] OR;  Service: Vascular;  Laterality: Right;  . AV FISTULA PLACEMENT N/A 02/24/2017   Procedure: INSERTION OF ARTERIOVENOUS (AV) GORE-TEX GRAFT ARM (BRACHIAL AXILLARY);  Surgeon: Katha Cabal, MD;  Location: [facility] ORS;  Service: Vascular;  Laterality: N/A;  . [place] Right 09/06/2016   Procedure: REMOVAL OF Right Arm ARTERIOVENOUS GORETEX GRAFT and Vein Patch angioplasty of brachial artery;  Surgeon: Angelia Mould, MD;  Location: [place];  Service: Vascular;  Laterality: Right;  . COLON RESECTION  1992  . COLON SURGERY    . CORONARY ANGIOPLASTY WITH STENT PLACEMENT  12/15/11   "2"  . CORONARY ANGIOPLASTY WITH STENT PLACEMENT  y/2013   "1; makes total of 3" (05/02/2012)  . CORONARY ARTERY BYPASS GRAFT  06/13/2012   Procedure: CORONARY ARTERY BYPASS GRAFTING (CABG);  Surgeon: Grace Isaac, MD;  Location: [place];  Service: Open Heart Surgery;  Laterality: N/A;  cabg x four;  using left internal mammary artery, and left leg greater saphenous vein harvested endoscopically   . CORONARY STENT INTERVENTION N/A 10/13/2016   Procedure: Coronary Stent Intervention;  Surgeon: Troy Sine, MD;  Location: [place] CV LAB;  Service: Cardiovascular;  Laterality: N/A;  . DIALYSIS/PERMA CATHETER REMOVAL N/A 04/18/2017   Procedure: DIALYSIS/PERMA CATHETER REMOVAL;  Surgeon: Katha Cabal, MD;  Location: [place] CV LAB;  Service: Cardiovascular;  Laterality: N/A;  . DILATION AND CURETTAGE OF UTERUS    . ESOPHAGOGASTRODUODENOSCOPY  01/20/2012   Procedure: ESOPHAGOGASTRODUODENOSCOPY (EGD);  Surgeon: Ladene Artist, MD,FACG;  Location: [facility] ENDOSCOPY;  Service: Endoscopy;  Laterality: N/A;  . ESOPHAGOGASTRODUODENOSCOPY N/A 03/26/2013   Procedure: ESOPHAGOGASTRODUODENOSCOPY (EGD);  Surgeon: Irene Shipper, MD;  Location: [facility] ENDOSCOPY;  Service: Endoscopy;  Laterality: N/A;  . ESOPHAGOGASTRODUODENOSCOPY N/A 04/30/2015   Procedure: ESOPHAGOGASTRODUODENOSCOPY (EGD);  Surgeon: Rogene Houston, MD;  Location: [facility] ENDO [facility];  Service: Endoscopy;  Laterality: N/A;  1pm - moved to 10/20 @ 1:10  . ESOPHAGOGASTRODUODENOSCOPY N/A 07/29/2016   Procedure: ESOPHAGOGASTRODUODENOSCOPY (EGD);  Surgeon: Manus Gunning, MD;  Location: [place];  Service: Gastroenterology;  Laterality: N/A;  enteroscopy  . INTRAOPERATIVE TRANSESOPHAGEAL ECHOCARDIOGRAM  06/13/2012   Procedure: INTRAOPERATIVE TRANSESOPHAGEAL ECHOCARDIOGRAM;  Surgeon: Grace Isaac, MD;  Location: [place];  Service: Open Heart Surgery;  Laterality: N/A;  . IR GENERIC HISTORICAL  07/26/2016   IR FLUORO GUIDE CV LINE RIGHT 07/26/2016 Greggory Keen, MD [facility]INTER[facility] RAD  . IR GENERIC HISTORICAL  07/26/2016   IR US GUIDE VASC ACCESS RIGHT 07/26/2016 Greggory Keen, MD [facility]  . IR GENERIC HISTORICAL  08/02/2016  IR US GUIDE VASC ACCESS RIGHT 08/02/2016 Greggory Keen, MD [facility]  . IR GENERIC HISTORICAL  08/02/2016   IR FLUORO GUIDE CV LINE RIGHT 08/02/2016 Greggory Keen, MD [facility]  . IR RADIOLOGY PERIPHERAL  GUIDED IV START  03/28/2017  . IR US GUIDE VASC ACCESS RIGHT  03/28/2017  . LEFT HEART CATH AND CORONARY ANGIOGRAPHY N/A 09/20/2016   Procedure: Left Heart Cath and Coronary Angiography;  Surgeon: [name], MD;  Location: [place] CV LAB;  Service: Cardiovascular;  Laterality: N/A;  . LEFT HEART CATH AND CORS/GRAFTS ANGIOGRAPHY N/A 10/13/2016   Procedure: Left Heart Cath and Cors/Grafts Angiography;  Surgeon: Troy Sine, MD;  Location: [place] CV LAB;  Service: Cardiovascular;  Laterality: N/A;  . LEFT HEART CATH AND CORS/GRAFTS ANGIOGRAPHY N/A 07/13/2018   Procedure: LEFT HEART CATH AND CORS/GRAFTS ANGIOGRAPHY;  Surgeon: Martinique, Peter M, MD;  Location: [place] CV LAB;  Service: Cardiovascular;  Laterality: N/A;  . LEFT HEART CATHETERIZATION WITH CORONARY ANGIOGRAM N/A 12/15/2011   Procedure: LEFT HEART CATHETERIZATION WITH CORONARY ANGIOGRAM;  Surgeon: Burn[place] Blanks, MD;  Location: [facility] CATH LAB;  Service: Cardiovascular;  Laterality: N/A;  . LEFT HEART CATHETERIZATION WITH CORONARY ANGIOGRAM N/A 01/10/2012   Procedure: LEFT HEART CATHETERIZATION WITH CORONARY ANGIOGRAM;  Surgeon: Peter M Martinique, MD;  Location: [facility] CATH LAB;  Service: Cardiovascular;  Laterality: N/A;  . LEFT HEART CATHETERIZATION WITH CORONARY ANGIOGRAM N/A 06/08/2012   Procedure: LEFT HEART CATHETERIZATION WITH CORONARY ANGIOGRAM;  Surgeon: Burnell Blanks, MD;  Location: [facility] CATH LAB;  Service: Cardiovascular;  Laterality: N/A;  . LEFT HEART CATHETERIZATION WITH CORONARY/GRAFT ANGIOGRAM N/A 12/10/2013   Procedure: LEFT HEART CATHETERIZATION WITH Beatrix Fetters;  Surgeon: [name], MD;  Location: [facility] CATH LAB;  Service: Cardiovascular;  Laterality: N/A;  . OVARY SURGERY     ovarian cancer  . REVISION OF ARTERIOVENOUS GORETEX GRAFT N/A 02/24/2017   Procedure: REVISION OF ARTERIOVENOUS GORETEX GRAFT (RESECTION);  Surgeon: Katha Cabal, MD;  Location: [facility] ORS;  Service: Vascular;   Laterality: N/A;  Earney Mallet N/A 10/15/2013   Procedure: Fistulogram;  Surgeon: Serafina Mitchell, MD;  Location: [facility] CATH LAB;  Service: Cardiovascular;  Laterality: N/A;  . THROMBECTOMY / ARTERIOVENOUS GRAFT REVISION  2011   left upper arm  . TUBAL LIGATION  1980's  . UPPER EXTREMITY ANGIOGRAPHY Bilateral 12/06/2016   Procedure: Upper Extremity Angiography;  Surgeon: Katha Cabal, MD;  Location: [place] CV LAB;  Service: Cardiovascular;  Laterality: Bilateral;  . UPPER EXTREMITY INTERVENTION Left 06/06/2017   Procedure: UPPER EXTREMITY INTERVENTION;  Surgeon: Katha Cabal, MD;  Location: [facility];  Service: Cardiovascular;  Laterality: Left;    Allergies:  Allergies  Allergen Reactions  . Aspirin Other (See Comments)    High Doses Mess up her stomach; "makes my bowels have blood in them". Takes 81 mg EC Aspirin   . Penicillins Other (See Comments)    SYNCOPE? , "makes me real weak when I take it; like I'll pass out"  Has patient had a PCN reaction causing immediate rash, facial/tongue/throat swelling, SOB or lightheadedness with hypotension: Yes Has patient had a PCN reaction causing severe rash involving mucus membranes or skin necrosis: no Has patient had a PCN reaction that required hospitalization no Has patient had a PCN reaction occurring within the last 10 years: no If all of the above  . Amlodipine Swelling  . Bactrim [Sulfamethoxazole-Trimethoprim] Rash  . Contrast Media [Iodinated Diagnostic Agents]  Itching  . Iron Itching and Other (See Comments)    "they gave me iron in dialysis; had to give me Benadryl cause I had to have the iron" (05/02/2012)  . Nitrofurantoin Hives  . Tylenol [Acetaminophen] Itching and Other (See Comments)    Makes her feet on fire per pt  . Gabapentin Other (See Comments)    Unknown reaction  . Hydralazine Itching  . Dexilant [Dexlansoprazole] Other (See Comments)    Upset stomach  . Levaquin [Levofloxacin In D5w]  Rash  . Morphine And Related Itching and Other (See Comments)    Itching in feet  . Plavix [Clopidogrel Bisulfate] Rash  . Protonix [Pantoprazole Sodium] Rash  . Venofer [Ferric Oxide] Itching and Other (See Comments)    Patient reports using Benadryl prior to doses as [place]    Medications:   Prior to Admission medications   Medication Sig Start Date End Date Taking? Authorizing Provider  ALPRAZolam Duanne Moron) 0.5 MG tablet Take 0.5 mg by mouth daily. 10/16/18   [provider]  amiodarone (PACERONE) 200 MG tablet Take 1 tablet (200 mg total) by mouth daily. 02/19/19   Herminio Commons, MD  aspirin EC 81 MG tablet Take 81 mg by mouth daily.    [provider]  carvedilol (COREG) 12.5 MG tablet Take 1 tablet (12.5 mg total) by mouth 2 (two) times daily with a meal for 30 days. Additional refills per PCP 12/27/18 01/26/19  Donne Hazel, MD  cloNIDine (CATAPRES) 0.1 MG tablet Take 1 tablet (0.1 mg total) by mouth 2 (two) times daily. 02/01/19   Daune Perch, NP  diphenhydrAMINE (BENADRYL) 25 mg capsule Take 50 mg by mouth daily as needed for itching or allergies.     [provider]  fluticasone (FLONASE) 50 MCG/ACT nasal spray Place 1 spray at bedtime as needed into both nostrils for allergies.     [provider]  guaiFENesin-codeine (ROBITUSSIN AC) 100-10 MG/5ML syrup Take 5 mLs by mouth 3 (three) times daily as needed for cough. 02/05/19   Virgel Manifold, MD  isosorbide mononitrate (IMDUR) 60 MG 24 hr tablet Take 1 tablet (60 mg total) by mouth 2 (two) times a day for 30 days. 12/15/18 03/02/19  Heath Lark D, DO  lidocaine-prilocaine (EMLA) cream Apply 1 application topically every Monday, Wednesday, and Friday. Prior to dialysis    [provider]  loratadine (CLARITIN) 10 MG tablet Take 1 tablet (10 mg total) by mouth daily as needed for allergies. 07/14/18   Hosie Poisson, MD  multivitamin (RENA-VIT) TABS tablet Take 1 tablet by mouth  daily.    [provider]  nitroGLYCERIN (NITROSTAT) 0.4 MG SL tablet DISSOLVE ONE TABLET UNDER THE TONGUE EVERY 5 MINUTES AS NEEDED FOR CHEST PAIN.  DO NOT EXCEED A TOTAL OF 3 DOSES IN 15 MINUTES Patient not taking: No sig reported 10/08/18   Herminio Commons, MD  omeprazole (PRILOSEC) 20 MG capsule Take 1 capsule by mouth once daily 01/31/19   Rehman, Mechele Dawley, MD  simvastatin (ZOCOR) 20 MG tablet TAKE ONE TABLET (20 MG) BY MOUTH DAILY AT BEDTIME Patient not taking: Reported on 03/05/2019 04/03/18   Herminio Commons, MD  ticagrelor (BRILINTA) 60 MG TABS tablet Take 1 tablet (60 mg total) by mouth 2 (two) times daily. 08/17/18   Almyra Deforest, PA    Discontinued Meds:   Medications Discontinued During This Encounter  Medication Reason  . albuterol (VENTOLIN HFA) 108 (90 Base) MCG/ACT inhaler 2 puff  Duplicate    Social History:  reports that she has never smoked. She has never used smokeless tobacco. She reports that she does not drink alcohol or use drugs.  Family History:   Family History  Problem Relation Age of Onset  . Heart disease Mother        Heart Disease before age 29  . Hyperlipidemia Mother   . Hypertension Mother   . Diabetes Mother   . Heart attack Mother   . Heart disease Father        Heart Disease before age 81  . Hyperlipidemia Father   . Hypertension Father   . Diabetes Father   . Diabetes Sister   . Hypertension Sister   . Diabetes Brother   . Hyperlipidemia Brother   . Heart attack Brother   . Hypertension Sister   . Heart attack Brother   . Other Other        noncontributory for early CAD  . Colon cancer Neg Hx   . Esophageal cancer Neg Hx   . Liver disease Neg Hx   . Kidney disease Neg Hx   . Colon polyps Neg Hx     Blood pressure (!) 176/58, pulse 63, temperature 99.4 F (37.4 C), temperature source Oral, resp. rate (!) 21, height 5\' 1"  (1.549 m), weight 56 kg, SpO2 100 %. General appearance: alert, cooperative and appears stated  age Head: Normocephalic, without obvious abnormality, atraumatic Eyes: negative Neck: no adenopathy, no carotid bruit, supple, symmetrical, trachea midline and thyroid not enlarged, symmetric, no tenderness/mass/nodules Back: symmetric, no curvature. ROM normal. No CVA tenderness. Resp: rales bibasilar and bilaterally Chest wall: no tenderness Cardio: S1, S2 normal GI: soft, non-tender; bowel sounds normal; no masses,  no organomegaly Extremities: extremities normal, atraumatic, no cyanosis or edema Pulses: 2+ and symmetric Access: LUA BCF good bruit       Zaim Nitta, Hunt Oris, MD 03/08/2019, 10:54 AM

## 2019-03-08 NOTE — ED Provider Notes (Addendum)
[facility] EMERGENCY DEPARTMENT Provider Note   CSN: [id number] Arrival date & time: 03/07/19  2212     History   Chief Complaint Chief Complaint  Patient presents with   Blood In Stools    HPI Shawna [name] is a 78 y.o. female.     Patient presents with complaints of rectal bleeding.  Patient has had ongoing issues of GI bleeding over the last couple of weeks.  She has been seen in the ER previously and also followed up with Dr. Laural Golden.  She underwent Givens capsule study earlier today, but results are not available.  She reports that she started to notice some generalized weakness earlier today during the study.  After getting home from the study this evening, she started having rectal bleeding.  She reports occasional slight cramping of the abdomen but no significant abdominal pain.  Bleeding has progressively worsened over the course of the night and she has now passing large clots.  No associated chest pain, palpitations, shortness of breath or syncope.     Past Medical History:  Diagnosis Date   Acute on chronic respiratory failure with hypoxia ([place]) 10/10/2016   Anxiety    Arthritis    AVM (arteriovenous malformation) of colon    CAD (coronary artery disease)    a. s/p CABG in 2013 b. most recent DES to D1 in 10/2016 with cath showing patent LIMA-LAD, SVG-RI, SVG-Mrg and SVG-RCA  c. NST in 02/2018 showing scar with no significant ischemia   Carotid artery disease (HCC)    a. 81-85% LICA, 12/3147    Chronic bronchitis (HCC)    Chronic diastolic CHF (congestive heart failure) ([place])    a. 02/2012 Echo EF 60-65%, nl wall motion, Gr 1 DD, mod MR   Colon cancer ([place]) 1992   Esophageal stricture    ESRD on hemodialysis ([place])    ESRD due to HTN, started dialysis 2011 and gets HD at [facility] with Dr Hinda Lenis on MWF schedule.  Access is LUA AVF as of Sept 2014.    GERD (gastroesophageal reflux disease)    High cholesterol 12/2011   History of blood transfusion  07/2011; 12/2011; 01/2012 X 2; 04/2012   History of gout    History of lower GI bleeding    Hypertension    Iron deficiency anemia    Mitral regurgitation    a. Moderate by echo, 02/2012   Myocardial infarction ([place])    Ovarian cancer ([place]) 1992   Pneumonia ~ 2009   PUD (peptic ulcer disease)    TIA (transient ischemic attack)     Patient Active Problem List   Diagnosis Date Noted   Gastrointestinal hemorrhage with melena 03/06/2019   Acute respiratory failure with hypoxia (HCC) 12/25/2018   Elevated troponin 12/14/2018   PAF (paroxysmal atrial fibrillation) ([place]) 08/06/2018   Chest pain at rest 07/13/2018   Hand steal syndrome ([place]) 08/01/2017   Anemia 07/14/2017   Coronary artery disease of bypass graft of native heart with stable angina pectoris ([place]) 06/05/2017   Mesenteric ischemia (HCC)    Diverticulitis    SBO (small bowel obstruction) ([place]) 03/22/2017   Enteritis    Complication of vascular access for dialysis 03/19/2017   Preoperative clearance 01/25/2017   Symptomatic anemia 10/24/2016   H/O non-ST elevation myocardial infarction (NSTEMI) 10/24/2016   Fluid overload 70/26/3785   Complication from renal dialysis device 10/10/2016   Non-ST elevation MI (NSTEMI) ([place])    Encounter for fitting and adjustment of vascular catheter  End stage renal disease ([place])    ESRD (end stage renal disease) ([place])    Heme positive stool    Demand ischemia ([place]) 07/27/2016   Hypertensive emergency 07/08/2016   Acute on chronic respiratory failure with hypoxia [facility])    Cardiac arrest [facility])    Palliative care encounter    Goals of care, counseling/discussion    Hypertensive crisis without congestive heart failure 05/09/2016   Acute pulmonary edema ([place]) 04/06/2016   Acute respiratory failure ([place]) 04/06/2016   Hypertensive crisis 01/27/2016   History of colon cancer 01/27/2016   History of ovarian cancer 01/27/2016   Hypertensive  urgency 01/27/2016   Narrow complex tachycardia ([place]) 09/08/2015   SVT (supraventricular tachycardia) ([place]) 09/08/2015   Influenza A 08/30/2015   Acute on chronic diastolic CHF (congestive heart failure) ([place]) 05/04/2015   Unstable angina ([place]) 05/03/2015   Essential hypertension    Pain in joint, lower leg 08/14/2014   Small bowel obstruction, partial ([place]) 05/29/2013   Chronic diastolic CHF (congestive heart failure) ([place]) 03/22/2013   GI bleeding 03/21/2013   Acute blood loss anemia 03/21/2013   Vaginal odor 03/12/2013   Vaginal discharge 03/12/2013   Occlusion and stenosis of carotid artery without mention of cerebral infarction 01/24/2013   Hx of CABG 07/05/2012   Carotid artery disease ([place]) 07/05/2012   Anemia of chronic kidney failure 07/03/2012   Secondary hyperparathyroidism ([place]) 07/03/2012   Mitral regurgitation 06/12/2012   Pneumonia 06/09/2012   Non-STEMI (non-ST elevated myocardial infarction) ([place]) 06/08/2012   Ischemic chest pain ([place]) 03/01/2012   AVM (arteriovenous malformation) of small bowel, acquired 01/20/2012   GERD (gastroesophageal reflux disease) 01/09/2012   HLD (hyperlipidemia) 01/05/2012   Atherosclerosis of native coronary artery of native heart without angina pectoris 12/16/2011   ESRD on hemodialysis ([place]) 12/16/2011    Past Surgical History:  Procedure Laterality Date   ABDOMINAL HYSTERECTOMY  1992   APPENDECTOMY  06/1990   AV FISTULA PLACEMENT  07/2009   left upper arm   AV FISTULA PLACEMENT Right 09/06/2016   Procedure: RIGHT FOREARM ARTERIOVENOUS (AV) GRAFT;  Surgeon: Elam Dutch, MD;  Location: [facility] OR;  Service: Vascular;  Laterality: Right;   AV FISTULA PLACEMENT N/A 02/24/2017   Procedure: INSERTION OF ARTERIOVENOUS (AV) GORE-TEX GRAFT ARM (BRACHIAL AXILLARY);  Surgeon: Katha Cabal, MD;  Location: [facility] ORS;  Service: Vascular;  Laterality: N/A;   [place] Right 09/06/2016   Procedure: REMOVAL  OF Right Arm ARTERIOVENOUS GORETEX GRAFT and Vein Patch angioplasty of brachial artery;  Surgeon: Angelia Mould, MD;  Location: [place];  Service: Vascular;  Laterality: Right;   [place] WITH STENT PLACEMENT  12/15/11   "2"   CORONARY ANGIOPLASTY WITH STENT PLACEMENT  y/2013   "1; makes total of 3" (05/02/2012)   CORONARY ARTERY BYPASS GRAFT  06/13/2012   Procedure: CORONARY ARTERY BYPASS GRAFTING (CABG);  Surgeon: Grace Isaac, MD;  Location: [place];  Service: Open Heart Surgery;  Laterality: N/A;  cabg x four;  using left internal mammary artery, and left leg greater saphenous vein harvested endoscopically   CORONARY STENT INTERVENTION N/A 10/13/2016   Procedure: Coronary Stent Intervention;  Surgeon: Troy Sine, MD;  Location: [place] CV LAB;  Service: Cardiovascular;  Laterality: N/A;   DIALYSIS/PERMA CATHETER REMOVAL N/A 04/18/2017   Procedure: DIALYSIS/PERMA CATHETER REMOVAL;  Surgeon: Katha Cabal, MD;  Location: [place] CV LAB;  Service: Cardiovascular;  Laterality: N/A;   DILATION AND CURETTAGE OF UTERUS     ESOPHAGOGASTRODUODENOSCOPY  01/20/2012   Procedure: ESOPHAGOGASTRODUODENOSCOPY (EGD);  Surgeon: Ladene Artist, MD,FACG;  Location: [facility] ENDOSCOPY;  Service: Endoscopy;  Laterality: N/A;   ESOPHAGOGASTRODUODENOSCOPY N/A 03/26/2013   Procedure: ESOPHAGOGASTRODUODENOSCOPY (EGD);  Surgeon: Irene Shipper, MD;  Location: [facility] ENDOSCOPY;  Service: Endoscopy;  Laterality: N/A;   ESOPHAGOGASTRODUODENOSCOPY N/A 04/30/2015   Procedure: ESOPHAGOGASTRODUODENOSCOPY (EGD);  Surgeon: Rogene Houston, MD;  Location: [facility] ENDO [facility];  Service: Endoscopy;  Laterality: N/A;  1pm - moved to 10/20 @ 1:10   ESOPHAGOGASTRODUODENOSCOPY N/A 07/29/2016   Procedure: ESOPHAGOGASTRODUODENOSCOPY (EGD);  Surgeon: Manus Gunning, MD;  Location: [place];  Service: Gastroenterology;  Laterality: N/A;  enteroscopy    INTRAOPERATIVE TRANSESOPHAGEAL ECHOCARDIOGRAM  06/13/2012   Procedure: INTRAOPERATIVE TRANSESOPHAGEAL ECHOCARDIOGRAM;  Surgeon: Grace Isaac, MD;  Location: [place];  Service: Open Heart Surgery;  Laterality: N/A;   IR GENERIC HISTORICAL  07/26/2016   IR FLUORO GUIDE CV LINE RIGHT 07/26/2016 Greggory Keen, MD [facility]-INTER[facility] RAD   IR GENERIC HISTORICAL  07/26/2016   IR US GUIDE VASC ACCESS RIGHT 07/26/2016 Greggory Keen, MD [facility]   IR GENERIC HISTORICAL  08/02/2016   IR US GUIDE VASC ACCESS RIGHT 08/02/2016 Greggory Keen, MD [facility]   IR GENERIC HISTORICAL  08/02/2016   IR FLUORO GUIDE CV LINE RIGHT 08/02/2016 Greggory Keen, MD [facility]   IR RADIOLOGY PERIPHERAL GUIDED IV START  03/28/2017   IR US GUIDE VASC ACCESS RIGHT  03/28/2017   LEFT HEART CATH AND CORONARY ANGIOGRAPHY N/A 09/20/2016   Procedure: Left Heart Cath and Coronary Angiography;  Surgeon: [name], MD;  Location: [place] CV LAB;  Service: Cardiovascular;  Laterality: N/A;   LEFT HEART CATH AND CORS/GRAFTS ANGIOGRAPHY N/A 10/13/2016   Procedure: Left Heart Cath and Cors/Grafts Angiography;  Surgeon: Troy Sine, MD;  Location: [place] CV LAB;  Service: Cardiovascular;  Laterality: N/A;   LEFT HEART CATH AND CORS/GRAFTS ANGIOGRAPHY N/A 07/13/2018   Procedure: LEFT HEART CATH AND CORS/GRAFTS ANGIOGRAPHY;  Surgeon: Martinique, Peter M, MD;  Location: [place] CV LAB;  Service: Cardiovascular;  Laterality: N/A;   LEFT HEART CATHETERIZATION WITH CORONARY ANGIOGRAM N/A 12/15/2011   Procedure: LEFT HEART CATHETERIZATION WITH CORONARY ANGIOGRAM;  Surgeon: Burnell Blanks, MD;  Location: [facility] CATH LAB;  Service: Cardiovascular;  Laterality: N/A;   LEFT HEART CATHETERIZATION WITH CORONARY ANGIOGRAM N/A 01/10/2012   Procedure: LEFT HEART CATHETERIZATION WITH CORONARY ANGIOGRAM;  Surgeon: Peter M Martinique, MD;  Location: [facility] CATH LAB;  Service: Cardiovascular;  Laterality: N/A;   LEFT HEART CATHETERIZATION WITH  CORONARY ANGIOGRAM N/A 06/08/2012   Procedure: LEFT HEART CATHETERIZATION WITH CORONARY ANGIOGRAM;  Surgeon: Burnell Blanks, MD;  Location: [facility] CATH LAB;  Service: Cardiovascular;  Laterality: N/A;   LEFT HEART CATHETERIZATION WITH CORONARY/GRAFT ANGIOGRAM N/A 12/10/2013   Procedure: LEFT HEART CATHETERIZATION WITH Beatrix Fetters;  Surgeon: [name], MD;  Location: [facility] CATH LAB;  Service: Cardiovascular;  Laterality: N/A;   OVARY SURGERY     ovarian cancer   REVISION OF ARTERIOVENOUS GORETEX GRAFT N/A 02/24/2017   Procedure: REVISION OF ARTERIOVENOUS GORETEX GRAFT (RESECTION);  Surgeon: Katha Cabal, MD;  Location: [facility] ORS;  Service: Vascular;  Laterality: N/A;   SHUNTOGRAM N/A 10/15/2013   Procedure: Fistulogram;  Surgeon: Serafina Mitchell, MD;  Location: [facility] CATH LAB;  Service: Cardiovascular;  Laterality: N/A;   THROMBECTOMY / ARTERIOVENOUS GRAFT REVISION  2011   left  upper arm   TUBAL LIGATION  1980's   UPPER EXTREMITY ANGIOGRAPHY Bilateral 12/06/2016   Procedure: Upper Extremity Angiography;  Surgeon: Katha Cabal, MD;  Location: [place] CV LAB;  Service: Cardiovascular;  Laterality: Bilateral;   UPPER EXTREMITY INTERVENTION Left 06/06/2017   Procedure: UPPER EXTREMITY INTERVENTION;  Surgeon: Katha Cabal, MD;  Location: [place] CV LAB;  Service: Cardiovascular;  Laterality: Left;     OB History    Gravida  2   Para  2   Term      Preterm  2   AB      Living  2     SAB      TAB      Ectopic      Multiple      Live Births               Home Medications    Prior to Admission medications   Medication Sig Start Date End Date Taking? Authorizing Provider  ALPRAZolam Duanne Moron) 0.5 MG tablet Take 0.5 mg by mouth daily. 10/16/18   [provider]  amiodarone (PACERONE) 200 MG tablet Take 1 tablet (200 mg total) by mouth daily. 02/19/19   Herminio Commons, MD  aspirin EC 81 MG tablet Take 81 mg by  mouth daily.    [provider]  carvedilol (COREG) 12.5 MG tablet Take 1 tablet (12.5 mg total) by mouth 2 (two) times daily with a meal for 30 days. Additional refills per PCP 12/27/18 01/26/19  Donne Hazel, MD  cloNIDine (CATAPRES) 0.1 MG tablet Take 1 tablet (0.1 mg total) by mouth 2 (two) times daily. 02/01/19   Daune Perch, NP  diphenhydrAMINE (BENADRYL) 25 mg capsule Take 50 mg by mouth daily as needed for itching or allergies.     [provider]  fluticasone (FLONASE) 50 MCG/ACT nasal spray Place 1 spray at bedtime as needed into both nostrils for allergies.     [provider]  guaiFENesin-codeine (ROBITUSSIN AC) 100-10 MG/5ML syrup Take 5 mLs by mouth 3 (three) times daily as needed for cough. 02/05/19   Virgel Manifold, MD  isosorbide mononitrate (IMDUR) 60 MG 24 hr tablet Take 1 tablet (60 mg total) by mouth 2 (two) times a day for 30 days. 12/15/18 03/02/19  Heath Lark D, DO  lidocaine-prilocaine (EMLA) cream Apply 1 application topically every Monday, Wednesday, and Friday. Prior to dialysis    [provider]  loratadine (CLARITIN) 10 MG tablet Take 1 tablet (10 mg total) by mouth daily as needed for allergies. 07/14/18   Hosie Poisson, MD  multivitamin (RENA-VIT) TABS tablet Take 1 tablet by mouth daily.    [provider]  nitroGLYCERIN (NITROSTAT) 0.4 MG SL tablet DISSOLVE ONE TABLET UNDER THE TONGUE EVERY 5 MINUTES AS NEEDED FOR CHEST PAIN.  DO NOT EXCEED A TOTAL OF 3 DOSES IN 15 MINUTES Patient not taking: No sig reported 10/08/18   Herminio Commons, MD  omeprazole (PRILOSEC) 20 MG capsule Take 1 capsule by mouth once daily 01/31/19   Rehman, Mechele Dawley, MD  simvastatin (ZOCOR) 20 MG tablet TAKE ONE TABLET (20 MG) BY MOUTH DAILY AT BEDTIME Patient not taking: Reported on 03/05/2019 04/03/18   Herminio Commons, MD  ticagrelor (BRILINTA) 60 MG TABS tablet Take 1 tablet (60 mg total) by mouth 2 (two) times daily. 08/17/18   Almyra Deforest, PA      Family History Family History  Problem Relation Age of Onset  Heart disease Mother        Heart Disease before age 16   Hyperlipidemia Mother    Hypertension Mother    Diabetes Mother    Heart attack Mother    Heart disease Father        Heart Disease before age 57   Hyperlipidemia Father    Hypertension Father    Diabetes Father    Diabetes Sister    Hypertension Sister    Diabetes Brother    Hyperlipidemia Brother    Heart attack Brother    Hypertension Sister    Heart attack Brother    Other Other        noncontributory for early CAD   Colon cancer Neg Hx    Esophageal cancer Neg Hx    Liver disease Neg Hx    Kidney disease Neg Hx    Colon polyps Neg Hx     Social History Social History   Tobacco Use   Smoking status: Never Smoker   Smokeless tobacco: Never Used  Substance Use Topics   Alcohol use: No    Alcohol/week: 0.0 standard drinks   Drug use: No     Allergies   Aspirin, Penicillins, Amlodipine, Bactrim [sulfamethoxazole-trimethoprim], Contrast media [iodinated diagnostic agents], Iron, Nitrofurantoin, Tylenol [acetaminophen], Gabapentin, Hydralazine, Dexilant [dexlansoprazole], Levaquin [levofloxacin in d5w], Morphine and related, Plavix [clopidogrel bisulfate], Protonix [pantoprazole sodium], and Venofer [ferric oxide]   Review of Systems Review of Systems  Gastrointestinal: Positive for anal bleeding.  All other systems reviewed and are negative.    Physical Exam Updated Vital Signs BP (!) 175/72    Pulse 93    Temp 99.4 F (37.4 C) (Oral)    Resp (!) 29    Ht 5\' 1"  (1.549 m)    Wt 56 kg    SpO2 100%    BMI 23.34 kg/m   Physical Exam Vitals signs and nursing note reviewed.  Constitutional:      General: She is not in acute distress.    Appearance: Normal appearance. She is well-developed.  HENT:     Head: Normocephalic and atraumatic.     Right Ear: Hearing normal.     Left Ear: Hearing normal.     Nose:  Nose normal.  Eyes:     Conjunctiva/sclera: Conjunctivae normal.     Pupils: Pupils are equal, round, and reactive to light.  Neck:     Musculoskeletal: Normal range of motion and neck supple.  Cardiovascular:     Rate and Rhythm: Regular rhythm.     Heart sounds: S1 normal and S2 normal. No murmur. No friction rub. No gallop.   Pulmonary:     Effort: Pulmonary effort is normal. No respiratory distress.     Breath sounds: Normal breath sounds.  Chest:     Chest wall: No tenderness.  Abdominal:     General: Bowel sounds are normal.     Palpations: Abdomen is soft.     Tenderness: There is no abdominal tenderness. There is no guarding or rebound. Negative signs include Murphy's sign and McBurney's sign.     Hernia: No hernia is present.  Musculoskeletal: Normal range of motion.  Skin:    General: Skin is warm and dry.     Findings: No rash.  Neurological:     Mental Status: She is alert and oriented to person, place, and time.     GCS: GCS eye subscore is 4. GCS verbal subscore is 5. GCS motor subscore is 6.  Cranial Nerves: No cranial nerve deficit.     Sensory: No sensory deficit.     Coordination: Coordination normal.  Psychiatric:        Speech: Speech normal.        Behavior: Behavior normal.        Thought Content: Thought content normal.      ED Treatments / Results  Labs (all labs ordered are listed, but only abnormal results are displayed) Labs Reviewed  CBC WITH DIFFERENTIAL/PLATELET - Abnormal; Notable for the following components:      Result Value   RBC 2.10 (*)    Hemoglobin 7.3 (*)    HCT 24.0 (*)    MCV 114.3 (*)    MCH 34.8 (*)    RDW 17.9 (*)    All other components within normal limits  COMPREHENSIVE METABOLIC PANEL - Abnormal; Notable for the following components:   Glucose, Bld 129 (*)    BUN 35 (*)    Creatinine, Ser 6.89 (*)    Total Protein 6.1 (*)    Albumin 3.4 (*)    AST 9 (*)    GFR calc non Af Amer 5 (*)    GFR calc Af Amer 6 (*)     All other components within normal limits  FIBRINOGEN - Abnormal; Notable for the following components:   Fibrinogen 481 (*)    All other components within normal limits  BLOOD GAS, ARTERIAL - Abnormal; Notable for the following components:   pH, Arterial 7.326 (*)    pCO2 arterial 49.4 (*)    pO2, Arterial 67.3 (*)    All other components within normal limits  SARS CORONAVIRUS 2 (HOSPITAL ORDER, [place] LAB)  PROTIME-INR  APTT  CBC WITH DIFFERENTIAL/PLATELET  HEMOGLOBIN AND HEMATOCRIT, BLOOD  HEMOGLOBIN AND HEMATOCRIT, BLOOD  TYPE AND SCREEN  PREPARE RBC (CROSSMATCH)    EKG EKG Interpretation  Date/Time:  Friday March 08 2019 02:44:50 EDT Ventricular Rate:  89 PR Interval:    QRS Duration: 127 QT Interval:  420 QTC Calculation: 512 R Axis:   74 Text Interpretation:  Sinus rhythm Probable left atrial enlargement Left bundle branch block Prolonged QT interval Baseline wander in lead(s) V2 Confirmed by Orpah Greek [phone]) on 03/08/2019 2:51:47 AM   Radiology Dg Chest Port 1 View  Result Date: 03/08/2019 CLINICAL DATA:  Lower GI bleed EXAM: PORTABLE CHEST 1 VIEW COMPARISON:  03/02/2019 FINDINGS: Small bilateral pleural effusions. Cardiomegaly with vascular congestion and diffuse interstitial opacity compatible with edema. Basilar consolidations. No pneumothorax. Post sternotomy changes. IMPRESSION: 1. Cardiomegaly with worsened vascular congestion and increased interstitial edema. Interim development of small bilateral pleural effusions and basilar atelectasis or pneumonia Electronically Signed   By: Donavan Foil M.D.   On: 03/08/2019 02:00    Procedures Procedures (including critical care time)  Angiocath insertion Performed by: Orpah Greek  Consent: Verbal consent obtained. Risks and benefits: risks, benefits and alternatives were discussed Time out: Immediately prior to procedure a "time out" was called to verify the  correct patient, procedure, equipment, support staff and site/side marked as required.  Preparation: Patient was prepped and draped in the usual sterile fashion.  Vein Location: right antecub   Ultrasound Guided  Gauge: 20  Normal blood return and flush without difficulty Patient tolerance: Patient tolerated the procedure well with no immediate complications.     Medications Ordered in ED Medications  albuterol (VENTOLIN HFA) 108 (90 Base) MCG/ACT inhaler 2 puff (has no administration in  time range)  0.9 %  sodium chloride infusion (Manually program via Guardrails IV Fluids) ( Intravenous Hold 03/08/19 0222)  nitroGLYCERIN 50 mg in dextrose 5 % 250 mL (0.2 mg/mL) infusion (30 mcg/min Intravenous Rate/Dose Change 03/08/19 0240)  nitroGLYCERIN (NITROSTAT) SL tablet 0.4 mg ( Sublingual Not Given 03/08/19 0220)     Initial Impression / Assessment and Plan / ED Course  I have reviewed the triage vital signs and the nursing notes.  Pertinent labs & imaging results that were available during my care of the patient were reviewed by me and considered in my medical decision making (see chart for details).        Patient presents to the emergency department for evaluation of rectal bleeding.  Patient has a history of colonic AVM and has been bleeding for a couple of weeks.  She had outpatient procedure today for capsule endoscopy, results not available.  Patient had increased bleeding tonight.  She has very mild symptoms of generalized weakness but no dizziness or syncope.  Hemoglobin has dropped to 7.3, baseline appears to be 8-9.  Based on this and the amount of bleeding she has experienced will require hospitalization and transfusion.  After decision was made to admit patient, I rechecked her and she has now complaining of shortness of breath.  No hypoxia.  Repeat examination reveals decreased air movement.  Patient reports that she uses nebulizer treatments at home, administered albuterol  without improvement.  Over the next 5 to 10 minutes, progressively worsened, developed rales in the bases and moderate to severe respiratory distress.  Portable chest x-ray worrisome for pulmonary edema.  Patient is a dialysis patient, therefore Lasix not possible.  Blood pressure significantly elevated, placed on nitro drip with improvement of blood pressure.  Unfortunately, could not initiate BiPAP until coronavirus testing returned.  Once this was found to be negative, patient placed on BiPAP with significant improvement of her breathing.  CRITICAL CARE Performed by: Orpah Greek   Total critical care time: 40 minutes  Critical care time was exclusive of separately billable procedures and treating other patients.  Critical care was necessary to treat or prevent imminent or life-threatening deterioration.  Critical care was time spent personally by me on the following activities: development of treatment plan with patient and/or surrogate as well as nursing, discussions with consultants, evaluation of patient's response to treatment, examination of patient, obtaining history from patient or surrogate, ordering and performing treatments and interventions, ordering and review of laboratory studies, ordering and review of radiographic studies, pulse oximetry and re-evaluation of patient's condition.   Final Clinical Impressions(s) / ED Diagnoses   Final diagnoses:  Lower GI bleed    ED Discharge Orders    None       Gladyes Kudo, Gwenyth Allegra, MD 03/08/19 0127    Orpah Greek, MD 03/08/19 [phone]

## 2019-03-08 NOTE — Op Note (Signed)
Small Bowel Givens Capsule Study Procedure date: March 07, 2019.  Referring Provider:  PCP:  Dr. 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  Her hemoglobin was 8.6.  Past history is also significant for colon carcinoma and last colonoscopy was in 2013.  Small bowel given capsule study was recommended to further evaluate GI bleed.   Findings:    Patient was able to swallow given capsule without any difficulty. Study is complete as capsule reached large bowel. Visualized gastric mucosa normal. Marked edema to bulbar mucosa. 2 small jejunal AV malformations without stigmata of bleeding noted(images 00:23:20 and 00:28:12). Single small jejunal diverticulum noted on image at 02:32:23 without stigmata of bleed. No fresh or old blood noted on this exam.  First Gastric image: 44 seconds First Duodenal image: 10 minutes and 25 seconds First Ileo-Cecal Valve image: 4 hours 37 minutes and 54 seconds First Cecal image: 4 hours 37 minutes and 55 seconds Gastric Passage time: 9 minutes and 21 seconds Small Bowel Passage time: 4-hour and 27 minutes  Summary & Recommendations:  Bulbar duodenitis without stigmata of bleed. 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  Patient will undergo diagnostic colonoscopy on 03/09/2019.

## 2019-03-08 NOTE — ED Notes (Signed)
Blood collected by lab

## 2019-03-08 NOTE — Progress Notes (Signed)
Patients BiPAP has been removed from room. Patient is on 3 lpm / Terral, She is pleasant and on phone.

## 2019-03-09 ENCOUNTER — Encounter (HOSPITAL_COMMUNITY)
Admission: EM | Disposition: A | Discharge status: Home / Self Care | Payer: Self-pay | Source: Home / Self Care | Attending: Internal Medicine

## 2019-03-09 DIAGNOSIS — N2581 Secondary hyperparathyroidism of renal origin: Secondary | ICD-10-CM

## 2019-03-09 DIAGNOSIS — K5730 Diverticulosis of large intestine without perforation or abscess without bleeding: Secondary | ICD-10-CM

## 2019-03-09 DIAGNOSIS — Z980 Intestinal bypass and anastomosis status: Secondary | ICD-10-CM

## 2019-03-09 DIAGNOSIS — D122 Benign neoplasm of ascending colon: Secondary | ICD-10-CM

## 2019-03-09 DIAGNOSIS — D123 Benign neoplasm of transverse colon: Secondary | ICD-10-CM

## 2019-03-09 HISTORY — PX: POLYPECTOMY: SHX5525

## 2019-03-09 HISTORY — PX: COLONOSCOPY: SHX5424

## 2019-03-09 LAB — RENAL FUNCTION PANEL
Albumin: 3.1 g/dL — ABNORMAL LOW (ref 3.5–5.0)
Anion gap: 12 (ref 5–15)
BUN: 19 mg/dL (ref 8–23)
CO2: 31 mmol/L (ref 22–32)
Calcium: 9.5 mg/dL (ref 8.9–10.3)
Chloride: 96 mmol/L — ABNORMAL LOW (ref 98–111)
Creatinine, Ser: 5.28 mg/dL — ABNORMAL HIGH (ref 0.44–1.00)
GFR calc Af Amer: 8 mL/min — ABNORMAL LOW (ref >60)
GFR calc non Af Amer: 7 mL/min — ABNORMAL LOW (ref >60)
Glucose, Bld: 97 mg/dL (ref 70–99)
Phosphorus: 5 mg/dL — ABNORMAL HIGH (ref 2.5–4.6)
Potassium: 3.6 mmol/L (ref 3.5–5.1)
Sodium: 139 mmol/L (ref 135–145)

## 2019-03-09 LAB — CBC
HCT: 38.1 % (ref 36.0–46.0)
Hemoglobin: 12 g/dL (ref 12.0–15.0)
MCH: 32.4 pg (ref 26.0–34.0)
MCHC: 31.5 g/dL (ref 30.0–36.0)
MCV: 103 fL — ABNORMAL HIGH (ref 80.0–100.0)
Platelets: 175 10*3/uL (ref 150–400)
RBC: 3.7 MIL/uL — ABNORMAL LOW (ref 3.87–5.11)
RDW: 20.7 % — ABNORMAL HIGH (ref 11.5–15.5)
WBC: 6.3 10*3/uL (ref 4.0–10.5)
nRBC: 0 % (ref 0.0–0.2)

## 2019-03-09 LAB — BPAM RBC
Blood Product Expiration Date: 202009252359
Blood Product Expiration Date: 202009272359
ISSUE DATE / TIME: 202008281057
ISSUE DATE / TIME: 202008281230
Unit Type and Rh: 5100
Unit Type and Rh: 5100

## 2019-03-09 LAB — TYPE AND SCREEN
ABO/RH(D): O POS
Antibody Screen: NEGATIVE
Donor AG Type: NEGATIVE
Donor AG Type: NEGATIVE
Unit division: 0
Unit division: 0

## 2019-03-09 LAB — OCCULT BLOOD X 1 CARD TO LAB, STOOL: Fecal Occult Bld: POSITIVE — AB

## 2019-03-09 SURGERY — COLONOSCOPY
Anesthesia: Moderate Sedation

## 2019-03-09 MED ORDER — MIDAZOLAM HCL 5 MG/5ML IJ SOLN
INTRAMUSCULAR | Status: DC | PRN
  Administered 2019-03-09: 1 mg via INTRAVENOUS
  Administered 2019-03-09: 2 mg via INTRAVENOUS
  Administered 2019-03-09: 1 mg via INTRAVENOUS

## 2019-03-09 MED ORDER — MIDAZOLAM HCL 5 MG/5ML IJ SOLN
INTRAMUSCULAR | Status: DC | PRN
  Administered 2019-03-09: 1 mg via INTRAVENOUS

## 2019-03-09 MED ORDER — FENTANYL CITRATE (PF) 100 MCG/2ML IJ SOLN
INTRAMUSCULAR | Status: AC
  Filled 2019-03-09: qty 2

## 2019-03-09 MED ORDER — MEPERIDINE HCL 50 MG/ML IJ SOLN: INTRAMUSCULAR | Status: DC | PRN

## 2019-03-09 MED ORDER — FENTANYL CITRATE (PF) 100 MCG/2ML IJ SOLN
INTRAMUSCULAR | Status: DC | PRN
  Administered 2019-03-09 (×2): 25 ug via INTRAVENOUS

## 2019-03-09 MED ORDER — MIDAZOLAM HCL 5 MG/5ML IJ SOLN
INTRAMUSCULAR | Status: AC
  Filled 2019-03-09: qty 10

## 2019-03-09 MED ORDER — MEPERIDINE HCL 50 MG/ML IJ SOLN
INTRAMUSCULAR | Status: AC
  Filled 2019-03-09: qty 1

## 2019-03-09 MED ORDER — STERILE WATER FOR IRRIGATION IR SOLN
Status: DC | PRN
  Administered 2019-03-09 (×2): 2.5 mL

## 2019-03-09 MED ORDER — ISOSORBIDE MONONITRATE ER 60 MG PO TB24
60.0000 mg | ORAL_TABLET | Freq: Every day | ORAL | Status: DC
  Administered 2019-03-09 – 2019-03-10 (×2): 60 mg via ORAL
  Filled 2019-03-09 (×2): qty 1

## 2019-03-09 NOTE — Progress Notes (Signed)
Preprocedure note  Examination performed anastomosis located in a sending colon/cecal area. Ileal colonic anastomosis wide open. 2 small polyps cold snared from ascending colon and submitted together. 30 mm lobulated polyp at proximal transverse colon. Polyp elevated with Ela view and removed piecemeal.  Polypectomy felt to be complete. Multiple diverticula throughout the colon. Patient tolerated procedure well.

## 2019-03-09 NOTE — Op Note (Signed)
[facility] Patient Name: Shawna [name] Procedure Date: 03/09/2019 9:25 AM MRN: [id number] Date of Birth: [birth year] Attending MD: Hildred Laser , MD CSN: [id number] Age: 78 Admit Type: Outpatient Procedure:                Colonoscopy Indications:              Gastrointestinal bleeding Providers:                Hildred Laser, MD, Lurline Del, RN, Aram Candela Referring MD:             Orson Eva, DO Medicines:                Fentanyl 50 micrograms IV, Midazolam 5 mg IV Complications:            No immediate complications. Estimated Blood Loss:     Estimated blood loss was minimal. Procedure:                Pre-Anesthesia Assessment:                           - Prior to the procedure, a History and Physical                            was performed, and patient medications and                            allergies were reviewed. The patient's tolerance of                            previous anesthesia was also reviewed. The risks                            and benefits of the procedure and the sedation                            options and risks were discussed with the patient.                            All questions were answered, and informed consent                            was obtained. Prior Anticoagulants: The patient                            last took Brilinta 3 days prior to the procedure.                            ASA Grade Assessment: III - A patient with severe                            systemic disease. After reviewing the risks and                            benefits, the patient was deemed in satisfactory  condition to undergo the procedure.                           After obtaining informed consent, the colonoscope                            was passed under direct vision. Throughout the                            procedure, the patient's blood pressure, pulse, and                            oxygen saturations were monitored continuously.  The                            [id number]-[id number] ([id number]) scope was introduced through                            the anus and advanced to the the ileocolonic                            anastomosis. The colonoscopy was performed without                            difficulty. The patient tolerated the procedure                            well. The quality of the bowel preparation was                            good. The terminal ileum and the rectum were                            photographed. Scope In: 1:43:33 PM Scope Out: 2:31:59 PM Scope Withdrawal Time: 0 hours 39 minutes 49 seconds  Total Procedure Duration: 0 hours 48 minutes 26 seconds  Findings:      The perianal and digital rectal examinations were normal.      Multiple diverticula were found in the entire colon.      There was evidence of a prior end-to-side ileo-colonic anastomosis in       the proximal ascending colon. This was patent.      Two sessile polyps were found in the proximal ascending colon. The       polyps were small in size. These polyps were removed with a cold snare.       Resection and retrieval were complete. The pathology specimen was placed       into Bottle Number 1.      A large polyp was found in the proximal transverse colon. The polyp was       multi-lobulated. Area was successfully injected with 4 mL Eleview for       lesion assessment, and this injection appeared to lift the lesion       adequately. The polyp was removed with a piecemeal technique using a hot       snare. Resection was complete, but the polyp tissue was only partially  retrieved. The pathology specimen was placed into Bottle Number 2.      The retroflexed view of the distal rectum and anal verge was normal and       showed no anal or rectal abnormalities. Impression:               - Diverticulosis in the entire examined colon.                           - Patent end-to-side ileo-colonic anastomosis.                           - Two  small polyps in the proximal ascending colon,                            removed with a cold snare. Resected and retrieved.                           - One large polyp in the proximal transverse colon,                            removed piecemeal using a hot snare. Complete                            resection. Partial retrieval. Injected. Moderate Sedation:      Moderate (conscious) sedation was administered by the endoscopy nurse       and supervised by the endoscopist. The following parameters were       monitored: oxygen saturation, heart rate, blood pressure, CO2       capnography and response to care. Total physician intraservice time was       53 minutes. Recommendation:           - Return patient to ICU for ongoing care.                           - Resume previous diet today.                           - Continue present medications.                           - Resume Coumadin (warfarin) in 3 days at prior                            dose.                           - Await pathology results.                           - No recommendation at this time regarding repeat                            colonoscopy. Procedure Code(s):        --- Professional ---  [phone], Colonoscopy, flexible; with removal of                            tumor(s), polyp(s), or other lesion(s) by snare                            technique                           45381, Colonoscopy, flexible; with directed                            submucosal injection(s), any substance                           99153, Moderate sedation; each additional 15                            minutes intraservice time                           99153, Moderate sedation; each additional 15                            minutes intraservice time                           99153, Moderate sedation; each additional 15                            minutes intraservice time                           G0500, Moderate sedation  services provided by the                            same physician or other qualified health care                            professional performing a gastrointestinal                            endoscopic service that sedation supports,                            requiring the presence of an independent trained                            observer to assist in the monitoring of the                            patient's level of consciousness and physiological                            status; initial 15 minutes of intra-service time;  patient age 62 years or older (additional time may                            be reported with [id number], as appropriate) Diagnosis Code(s):        --- Professional ---                           Z98.0, Intestinal bypass and anastomosis status                           K63.5, Polyp of colon                           K92.2, Gastrointestinal hemorrhage, unspecified                           K57.30, Diverticulosis of large intestine without                            perforation or abscess without bleeding CPT copyright 2019 [employer]. All rights reserved. The codes documented in this report are preliminary and upon coder review may  be revised to meet current compliance requirements. Hildred Laser, MD Hildred Laser, MD 03/09/2019 2:53:29 PM This report has been signed electronically. Number of Addenda: 0

## 2019-03-09 NOTE — Progress Notes (Signed)
PROGRESS NOTE  Shawna [name] UEK:[id number] DOB: 19-Nov-1940 DOA: 03/07/2019 PCP: [name], MD  Brief History:  78 y.o.femalewith medical history ofESRD(MWF)who normally dialyzes at [facility] in [place], diastolic CHF, hypertension, hyperlipidemia, colonic AVMs, TIA, coronary artery disease presenting with hematochezia over the past 2 to 3 days. The patient was seen in the emergency department on 03/02/2019. Hemoccult was positive at that time with a hemoglobin of 10.1. As the patient was clinically stable, she was discharged home to follow-up with her GI physician. The patient presented to short stay on 03/07/2019 capsule endoscopy. After the patient went home, the patient stated that she felt somewhat "drunk" and lethargic. She proceeded through the capsule endoscopy procedure and subsequently returned equipment later in the afternoon on 03/07/2019. However, the patient had an episode of hematochezia at home late in the afternoon. As result, the patient returned to the hospital for further evaluation. Upon presentation, the patient was noted to have a hemoglobin of 7.3. She subsequently developed shortness of breath. Chest x-ray showed pulmonary edema in the emergency department. She was subsequently placed on BiPAP. The patient has been compliant with dialysis, last dialyzed on 03/06/2019. However, she states that occasionally she has had to be taken off early because of low blood pressure. She denies any dietary or fluid indiscretion. She denies any fevers, chills, chest pain, nausea, vomiting, abdominal pain, headache, neck pain, coughing, hemoptysis. In the emergency department, the patient had a low-grade temperature 9 9.4 F. She was stable on BiPAP with FiO2 35% with oxygen saturation of 100%. BMP was unremarkable except for serum creatinine 6.89. LFTs were unremarkable. EKG showed sinus rhythm with left bundle branch block. Chest x-ray showed pulmonary vascular  congestion and small bilateral pleural effusions. GI and nephrology were consulted to assist with management.  Assessment/Plan:  Hematochezia/acute blood loss anemia -Holding Brilinta -GI consult--discussedDr. Laural Golden  -Based on hemoglobin 8-9 -Patient has reported rash to Protonix -She is on omeprazole at home -Restart omeprazole  -2 units PRBCs have been given -8/29 colonoscopy--diverticulosis; one large polyp in transverse colon removed piemeal; patent ileocolonic anastomosis  ESRD -Nephrology has been consulted -She is on dialysis Monday/Wednesday/Friday -HD on 03/08/19  Flash pulmonary edema/Acute respiratory failure with hypoxia -Currently stable on BiPAP -Nephrology consulted for dialysis -HD on 03/08/19 -Wean off nitroglycerin drip -12/14/2018 Echo EF 60 to 65%, positive diastolic dysfunction, mild to moderate MR -SARS-CoV2--neg  Paroxysmal atrial fibrillation/SVT -Restarted amiodarone -Restarted carvedilol  -currently in sinus -poor candidate for anticoagulation due to recurrent GIB  Essential hypertension -Continue carvedilol, clonidine, Imdur  Coronary artery disease -No chest pain presently -Restart Imdur, carvedilol, Zocor -Aspirin and Brilinta on hold secondary to GI bleed -repeatcath in 07/2018 showing grafts with occlusion of D1 at prior stent site and progression of PDA disease -->medical management recommended.  Hyperglycemia -Check hemoglobin A1c      Disposition Plan:   Home 8/30 if stable  Family Communication:   Spouse updated at bedside 8/29  Consultants:  GI, renal, cardiology  Code Status:  FULL   DVT Prophylaxis:  SCDs   Procedures: As Listed in Progress Note Above  Antibiotics: none    Subjective: Patient denies fevers, chills, headache, chest pain, dyspnea, nausea, vomiting, diarrhea, abdominal pain, dysuria, hematuria, hematochezia, and melena.   Objective: Vitals:   03/09/19 1525 03/09/19 1530  03/09/19 1535 03/09/19 1619  BP:      Pulse: 60 62 (!) 54 64  Resp: 16 (!) 25 18 20  Temp:    (!) 97.3 F (36.3 C)  TempSrc:    Oral  SpO2: 100% 100% 100% 100%  Weight:      Height:       No intake or output data in the 24 hours ending 03/09/19 1726 Weight change: 0.681 kg Exam:   General:  Pt is alert, follows commands appropriately, not in acute distress  HEENT: No icterus, No thrush, No neck mass, /AT  Cardiovascular: RRR, S1/S2, no rubs, no gallops  Respiratory: CTA bilaterally, no wheezing, no crackles, no rhonchi  Abdomen: Soft/+BS, non tender, non distended, no guarding  Extremities: No edema, No lymphangitis, No petechiae, No rashes, no synovitis   Data Reviewed: I have personally reviewed following labs and imaging studies Basic Metabolic Panel: Recent Labs  Lab 03/05/19 1159 03/07/19 2356 03/08/19 0954 03/09/19 0444  NA 139 142 144   143 139  K 4.1 4.2 4.8   4.8 3.6  CL 98 100 104   103 96*  CO2 30 28 27   27 31   GLUCOSE 111 129* 103*   103* 97  BUN 35* 35* 41*   39* 19  CREATININE 6.57* 6.89* 7.98*   7.99* 5.28*  CALCIUM 9.1 9.3 9.0   9.0 9.5  PHOS  --   --  5.9* 5.0*   Liver Function Tests: Recent Labs  Lab 03/05/19 1159 03/07/19 2356 03/08/19 0954 03/09/19 0444  AST 4* 9* 9*  --   ALT 4* 9 8  --   ALKPHOS  --  59 54  --   BILITOT 0.7 0.5 0.5  --   PROT 5.6* 6.1* 5.5*  --   ALBUMIN  --  3.4* 3.0*   3.0* 3.1*   No results for input(s): LIPASE, AMYLASE in the last 168 hours. No results for input(s): AMMONIA in the last 168 hours. Coagulation Profile: Recent Labs  Lab 03/07/19 2356 03/08/19 0954  INR 1.1 1.1   CBC: Recent Labs  Lab 03/05/19 1159 03/07/19 2356 03/08/19 0512 03/08/19 0954 03/08/19 1915 03/09/19 0443  WBC 6.9 7.6 11.7* 7.1  --  6.3  NEUTROABS 5,134 5.9 9.5*  --   --   --   HGB 8.6* 7.3* 7.5* 6.8* 12.1 12.0  HCT 26.9* 24.0* 23.5* 23.2* 38.3 38.1  MCV 106.3* 114.3* 107.8* 114.9*  --  103.0*  PLT 232 223 215  216  --  175   Cardiac Enzymes: No results for input(s): CKTOTAL, CKMB, CKMBINDEX, TROPONINI in the last 168 hours. BNP: Invalid input(s): POCBNP CBG: No results for input(s): GLUCAP in the last 168 hours. HbA1C: No results for input(s): HGBA1C in the last 72 hours. Urine analysis:    Component Value Date/Time   COLORURINE YELLOW 12/16/2012 1919   APPEARANCEUR CLOUDY (A) 12/16/2012 1919   LABSPEC 1.009 12/16/2012 1919   PHURINE 7.5 12/16/2012 1919   GLUCOSEU NEGATIVE 12/16/2012 1919   HGBUR TRACE (A) 12/16/2012 1919   BILIRUBINUR NEGATIVE 12/16/2012 1919   KETONESUR NEGATIVE 12/16/2012 1919   PROTEINUR 100 (A) 12/16/2012 1919   UROBILINOGEN 0.2 12/16/2012 1919   NITRITE NEGATIVE 12/16/2012 1919   LEUKOCYTESUR SMALL (A) 12/16/2012 1919   Sepsis Labs: @LABRCNTIP (procalcitonin:4,lacticidven:4) ) Recent Results (from the past 240 hour(s))  SARS Coronavirus 2 [facility] order, Performed in [facility] hospital lab) Nasopharyngeal Nasopharyngeal Swab     Status: None   Collection Time: 03/08/19 12:55 AM   Specimen: Nasopharyngeal Swab  Result Value Ref Range Status   SARS Coronavirus 2 NEGATIVE NEGATIVE Final  Comment: (NOTE) If result is NEGATIVE SARS-CoV-2 target nucleic acids are NOT DETECTED. The SARS-CoV-2 RNA is generally detectable in upper and lower  respiratory specimens during the acute phase of infection. The lowest  concentration of SARS-CoV-2 viral copies this assay can detect is 250  copies / mL. A negative result does not preclude SARS-CoV-2 infection  and should not be used as the sole basis for treatment or other  patient management decisions.  A negative result may occur with  improper specimen collection / handling, submission of specimen other  than nasopharyngeal swab, presence of viral mutation(s) within the  areas targeted by this assay, and inadequate number of viral copies  (<250 copies / mL). A negative result must be combined with clinical    observations, patient history, and epidemiological information. If result is POSITIVE SARS-CoV-2 target nucleic acids are DETECTED. The SARS-CoV-2 RNA is generally detectable in upper and lower  respiratory specimens dur ing the acute phase of infection.  Positive  results are indicative of active infection with SARS-CoV-2.  Clinical  correlation with patient history and other diagnostic information is  necessary to determine patient infection status.  Positive results do  not rule out bacterial infection or co-infection with other viruses. If result is PRESUMPTIVE POSTIVE SARS-CoV-2 nucleic acids MAY BE PRESENT.   A presumptive positive result was obtained on the submitted specimen  and confirmed on repeat testing.  While 2019 novel coronavirus  (SARS-CoV-2) nucleic acids may be present in the submitted sample  additional confirmatory testing may be necessary for epidemiological  and / or clinical management purposes  to differentiate between  SARS-CoV-2 and other Sarbecovirus currently known to infect humans.  If clinically indicated additional testing with an alternate test  methodology [phone]) is advised. The SARS-CoV-2 RNA is generally  detectable in upper and lower respiratory sp ecimens during the acute  phase of infection. The expected result is Negative. Fact Sheet for Patients:  StrictlyIdeas.no Fact Sheet for Healthcare Providers: BankingDealers.co.za This test is not yet approved or cleared by the [place] FDA and has been authorized for detection and/or diagnosis of SARS-CoV-2 by FDA under an Emergency Use Authorization (EUA).  This EUA will remain in effect (meaning this test can be used) for the duration of the COVID-19 declaration under Section 564(b)(1) of the Act, 21 U.S.C. section 360bbb-3(b)(1), unless the authorization is terminated or revoked sooner. Performed at [facility], [address],  [place], [place] [address]   MRSA PCR Screening     Status: None   Collection Time: 03/08/19  9:54 AM   Specimen: Nasal Mucosa; Nasopharyngeal  Result Value Ref Range Status   MRSA by PCR NEGATIVE NEGATIVE Final    Comment:        The GeneXpert MRSA Assay (FDA approved for NASAL specimens only), is one component of a comprehensive MRSA colonization surveillance program. It is not intended to diagnose MRSA infection nor to guide or monitor treatment for MRSA infections. Performed at [facility], [address] [place], [place] [address]      Scheduled Meds:  sodium chloride   Intravenous Once   ALPRAZolam  0.5 mg Oral Daily   amiodarone  200 mg Oral Daily   carvedilol  12.5 mg Oral BID WC   Chlorhexidine Gluconate Cloth  6 each Topical Q0600   fentaNYL       isosorbide mononitrate  60 mg Oral Daily   lidocaine-prilocaine  1 application Topical Q M,W,F   midazolam  multivitamin  1 tablet Oral Daily   simvastatin  40 mg Oral q1800   Continuous Infusions:  sodium chloride     sodium chloride     sodium chloride     sodium chloride      Procedures/Studies: Ct Abdomen Pelvis Wo Contrast  Result Date: 03/02/2019 CLINICAL DATA:  Black stools for 3 days.  Abdominal pain. EXAM: CT ABDOMEN AND PELVIS WITHOUT CONTRAST TECHNIQUE: Multidetector CT imaging of the abdomen and pelvis was performed following the standard protocol without IV contrast. COMPARISON:  02/14/2019 FINDINGS: Lower chest: Bronchial wall thickening noted both lower lobes with interstitial and ground-glass airspace opacity, right greater than left. Hepatobiliary: No focal abnormality in the liver on this study without intravenous contrast. There is no evidence for gallstones, gallbladder wall thickening, or pericholecystic fluid. No intrahepatic or extrahepatic biliary dilation. Pancreas: No focal mass lesion. No dilatation of the main duct. No intraparenchymal cyst. No peripancreatic edema. Spleen: No  splenomegaly. No focal mass lesion. Adrenals/Urinary Tract: No adrenal nodule or mass. Kidneys are atrophic with multiple low-density and intermediate density lesions, incompletely characterized but similar to prior. No evidence for hydroureter. Bladder decompressed. Stomach/Bowel: Stomach is unremarkable. No gastric wall thickening. No evidence of outlet obstruction. Duodenum is normally positioned as is the ligament of Treitz. No small bowel wall thickening. No small bowel dilatation. The appendix is not visualized, but there is no edema or inflammation in the region of the cecum. Apparent staple line noted in the right colon. Diverticuli are seen scattered along the entire length of the colon without CT findings of diverticulitis. Left colonic anastomosis noted near the rectosigmoid junction. Vascular/Lymphatic: There is abdominal aortic atherosclerosis without aneurysm. There is no gastrohepatic or hepatoduodenal ligament lymphadenopathy. No intraperitoneal or retroperitoneal lymphadenopathy. No pelvic sidewall lymphadenopathy. Reproductive: The uterus is surgically absent. There is no adnexal mass. Other: No intraperitoneal free fluid. Musculoskeletal: No worrisome lytic or sclerotic osseous abnormality. IMPRESSION: 1. Interval development of interstitial and basilar airspace opacity in both lungs. Basilar infection/inflammation a consideration. 2. Bilateral lesions of varying size and attenuation again noted in the atrophic kidneys. The 3.0 x 2.3 cm heterogeneous lesion in question involving the upper pole right kidney is stable. While this cannot be definitively characterized, is not substantially changed in 1 year since 12/28/2017. Continued attention on follow-up recommended. 3. Diffuse diverticulosis without diverticulitis 4.  Aortic Atherosclerois (ICD10-170.0) Electronically Signed   By: Misty Stanley M.D.   On: 03/02/2019 19:56   Ct Abdomen Pelvis Wo Contrast  Result Date: 02/14/2019 CLINICAL DATA:   78 year old female with history of indeterminate lesion in the upper pole of the right kidney. Follow-up study. EXAM: CT ABDOMEN AND PELVIS WITHOUT CONTRAST TECHNIQUE: Multidetector CT imaging of the abdomen and pelvis was performed following the standard protocol without IV contrast. COMPARISON:  CT the abdomen and pelvis 12/28/2017. FINDINGS: Lower chest: Scattered areas of mild cylindrical bronchiectasis and scarring in the lower lungs bilaterally. Atherosclerotic calcifications in the descending thoracic aorta as well as the left anterior descending, left circumflex and right coronary arteries. Hepatobiliary: No definite cystic or solid hepatic lesions are confidently identified on today's noncontrast CT examination. Amorphous intermediate attenuation lying dependently in the gallbladder likely to reflect biliary sludge. No findings to suggest an acute cholecystitis at this time. Pancreas: No definite pancreatic mass or peripancreatic fluid collections or inflammatory changes are noted on today's noncontrast CT examination. Spleen: Unremarkable. Adrenals/Urinary Tract: Atrophy of both kidneys. Multiple lesions in both kidneys, most of which are low-attenuation, none of  which are characterized on today's noncontrast CT examination. There is again an exophytic lesion in the upper pole of the right kidney which currently measures 3.2 x 2.3 cm and is intermediate attenuation (30 HU), similar to the prior examinations. Multiple calcifications associated with renal hila bilaterally appear to be vascular. Stomach/Bowel: Normal appearance of the stomach. No pathologic dilatation of small bowel or colon. Suture line near the rectosigmoid junction suggesting prior partial colectomy. Numerous colonic diverticulae are noted, particularly in the descending colon and sigmoid colon, without surrounding inflammatory changes to suggest an acute diverticulitis at this time. The appendix is not confidently identified and may be  surgically absent. Regardless, there are no inflammatory changes noted adjacent to the cecum to suggest the presence of an acute appendicitis at this time. Vascular/Lymphatic: Aortic atherosclerosis. No lymphadenopathy noted in the abdomen or pelvis. Reproductive: Status post hysterectomy. Ovaries are not confidently identified may be surgically absent or atrophic. Other: No significant volume of ascites.  No pneumoperitoneum. Musculoskeletal: There are no aggressive appearing lytic or blastic lesions noted in the visualized portions of the skeleton. IMPRESSION: 1. Indeterminate lesion in the upper pole of the right kidney is stable compared to the prior study and favored to be benign, or a slow growing low-grade renal neoplasm. 2. Aortic atherosclerosis, in addition to at least 3 vessel coronary artery disease. Assessment for potential risk factor modification, dietary therapy or pharmacologic therapy may be warranted, if clinically indicated. 3. Severe colonic diverticulosis without evidence of acute diverticulitis at this time. 4. Aortic atherosclerosis. 5. Additional incidental findings, as above. Electronically Signed   By: Vinnie Langton M.D.   On: 02/14/2019 16:29   Dg Chest Port 1 View  Result Date: 03/08/2019 CLINICAL DATA:  Lower GI bleed EXAM: PORTABLE CHEST 1 VIEW COMPARISON:  03/02/2019 FINDINGS: Small bilateral pleural effusions. Cardiomegaly with vascular congestion and diffuse interstitial opacity compatible with edema. Basilar consolidations. No pneumothorax. Post sternotomy changes. IMPRESSION: 1. Cardiomegaly with worsened vascular congestion and increased interstitial edema. Interim development of small bilateral pleural effusions and basilar atelectasis or pneumonia Electronically Signed   By: Donavan Foil M.D.   On: 03/08/2019 02:00   Dg Chest Port 1 View  Result Date: 03/02/2019 CLINICAL DATA:  Shortness breath. Cough EXAM: PORTABLE CHEST 1 VIEW COMPARISON:  Chest radiograph  02/05/2019. Lung bases from abdominal CT earlier this day. FINDINGS: Post median sternotomy. Similar cardiomegaly. Unchanged mediastinal contours. Increased interstitial and bronchial thickening since prior exam. Linear scarring in the left mid lung. No pleural effusion or pneumothorax. Stable osseous structures. IMPRESSION: Increased interstitial and bronchial thickening since prior exam, favoring pulmonary edema. Acute infection is also considered. Stable cardiomegaly. Electronically Signed   By: Keith Rake M.D.   On: 03/02/2019 20:59    Orson Eva, DO  [facility] Pager [phone]  If 7PM-7AM, please contact night-coverage [email or web address] Password [email or web address] 03/09/2019, 5:26 PM   LOS: 1 day

## 2019-03-10 LAB — CBC
HCT: 32.2 % — ABNORMAL LOW (ref 36.0–46.0)
Hemoglobin: 10.1 g/dL — ABNORMAL LOW (ref 12.0–15.0)
MCH: 32.3 pg (ref 26.0–34.0)
MCHC: 31.4 g/dL (ref 30.0–36.0)
MCV: 102.9 fL — ABNORMAL HIGH (ref 80.0–100.0)
Platelets: 192 10*3/uL (ref 150–400)
RBC: 3.13 MIL/uL — ABNORMAL LOW (ref 3.87–5.11)
RDW: 19 % — ABNORMAL HIGH (ref 11.5–15.5)
WBC: 6.9 10*3/uL (ref 4.0–10.5)
nRBC: 0 % (ref 0.0–0.2)

## 2019-03-10 MED ORDER — ASPIRIN EC 81 MG PO TBEC: 81.0000 mg | DELAYED_RELEASE_TABLET | Freq: Every day | ORAL | Status: DC

## 2019-03-10 MED ORDER — TICAGRELOR 60 MG PO TABS: 60.0000 mg | ORAL_TABLET | Freq: Two times a day (BID) | ORAL | 6 refills | Status: DC

## 2019-03-10 MED ORDER — ATORVASTATIN CALCIUM 20 MG PO TABS: 20.0000 mg | ORAL_TABLET | Freq: Every day | ORAL | Status: DC

## 2019-03-10 MED ORDER — CLONIDINE HCL 0.1 MG PO TABS
0.1000 mg | ORAL_TABLET | Freq: Two times a day (BID) | ORAL | Status: DC
  Administered 2019-03-10: 0.1 mg via ORAL
  Filled 2019-03-10: qty 1

## 2019-03-10 NOTE — Progress Notes (Signed)
Pt admitted to [facility]

## 2019-03-10 NOTE — Progress Notes (Signed)
  Subjective:  Patient has no complaints.  She states she passed formed stool last night.  No melena or rectal bleeding reported.  She denies abdominal pain.  Objective: Blood pressure (!) 130/45, pulse 61, temperature (!) 97.4 F (36.3 C), temperature source Oral, resp. rate 16, height '5\' 1"'$  (1.549 m), weight 56 kg, SpO2 94 %. Patient is alert.  She is eating her lunch. Abdomen is soft and nontender with organomegaly or masses.  Labs/studies Results:  CBC Latest Ref Rng & Units 03/10/2019 03/09/2019 03/08/2019  WBC 4.0 - 10.5 K/uL 6.9 6.3 -  Hemoglobin 12.0 - 15.0 g/dL 10.1(L) 12.0 12.1  Hematocrit 36.0 - 46.0 % 32.2(L) 38.1 38.3  Platelets 150 - 400 K/uL 192 175 -    CMP Latest Ref Rng & Units 03/09/2019 03/08/2019 03/08/2019  Glucose 70 - 99 mg/dL 97 103(H) 103(H)  BUN 8 - 23 mg/dL 19 41(H) 39(H)  Creatinine 0.44 - 1.00 mg/dL 5.28(H) 7.98(H) 7.99(H)  Sodium 135 - 145 mmol/L 139 144 143  Potassium 3.5 - 5.1 mmol/L 3.6 4.8 4.8  Chloride 98 - 111 mmol/L 96(L) 104 103  CO2 22 - 32 mmol/L '31 27 27  '$ Calcium 8.9 - 10.3 mg/dL 9.5 9.0 9.0  Total Protein 6.5 - 8.1 g/dL - - 5.5(L)  Total Bilirubin 0.3 - 1.2 mg/dL - - 0.5  Alkaline Phos 38 - 126 U/L - - 54  AST 15 - 41 U/L - - 9(L)  ALT 0 - 44 U/L - - 8    Hepatic Function Latest Ref Rng & Units 03/09/2019 03/08/2019 03/08/2019  Total Protein 6.5 - 8.1 g/dL - - 5.5(L)  Albumin 3.5 - 5.0 g/dL 3.1(L) 3.0(L) 3.0(L)  AST 15 - 41 U/L - - 9(L)  ALT 0 - 44 U/L - - 8  Alk Phosphatase 38 - 126 U/L - - 54  Total Bilirubin 0.3 - 1.2 mg/dL - - 0.5  Bilirubin, Direct 0.0 - 0.2 mg/dL - - -   Polyp biopsy pending.  Assessment:  #1.  Acute on chronic GI bleed.  Present episode of GI bleed most likely due to colonic diverticulosis.  Chronic GI bleed possibly from small bowel.  She has history of jejunal AV malformations.  2 small lesions were seen on 1 capsule study without stigmata of bleed.  Colonoscopy yesterday revealed pancolonic diverticulosis  patent anastomosis to small and 1 large polyp.  A large polyp removed piecemeal after was lifted with eleview.  Patient doing fine post procedure.  Patient is high risk for post polypectomy bleed.  Therefore needs to be off aspirin and Brilinta for few days.  Discussed with Dr. Shanon Brow Tat.  #2.  Anemia secondary to GI bleed.  Patient has received 2 units of PRBCs.  Her hemoglobin yesterday was 12 g and felt not to be accurate.  Her hemoglobin of 10.1 which is more in line with what 1 would expect with transfusion.  Pretransfusion hemoglobin was 6.8 g.  #3.  End-stage renal disease.  Patient on hemodialysis.  #4.  Coronary artery disease and history of paroxysmal atrial fibrillation.   Recommendations:  Resume aspirin and Brilinta on 03/13/2019. I will contact patient with biopsy results. Office visit in 3 to 4 weeks.

## 2019-03-10 NOTE — Progress Notes (Signed)
Pt admitted to [facility]

## 2019-03-10 NOTE — Discharge Summary (Signed)
Physician Discharge Summary  Shawna [name] DPO:[id number] DOB: 24-Feb-1941 DOA: 03/07/2019  PCP: [name], MD  Admit date: 03/07/2019 Discharge date: 03/10/2019  Admitted From: Home Disposition:  Home   Recommendations for Outpatient Follow-up:  1. Follow up with PCP in 1-2 weeks 2. Please obtain BMP/CBC in one week    Discharge Condition: Stable CODE STATUS: FULL Diet recommendation: Renal   Brief/Interim Summary: 78 y.o.femalewith medical history ofESRD(MWF)who normally dialyzes at [facility] in [place], diastolic CHF, hypertension, hyperlipidemia, colonic AVMs, TIA, coronary artery disease presenting with hematochezia over the past 2 to 3 days. The patient was seen in the emergency department on 03/02/2019. Hemoccult was positive at that time with a hemoglobin of 10.1. As the patient was clinically stable, she was discharged home to follow-up with her GI physician. The patient presented to short stay on 03/07/2019 capsule endoscopy. After the patient went home, the patient stated that she felt somewhat "drunk" and lethargic. She proceeded through the capsule endoscopy procedure and subsequently returned equipment later in the afternoon on 03/07/2019. However, the patient had an episode of hematochezia at home late in the afternoon. As result, the patient returned to the hospital for further evaluation. Upon presentation, the patient was noted to have a hemoglobin of 7.3. She subsequently developed shortness of breath. Chest x-ray showed pulmonary edema in the emergency department. She was subsequently placed on BiPAP. The patient has been compliant with dialysis, last dialyzed on 03/06/2019. However, she states that occasionally she has had to be taken off early because of low blood pressure. She denies any dietary or fluid indiscretion. She denies any fevers, chills, chest pain, nausea, vomiting, abdominal pain, headache, neck pain, coughing, hemoptysis. In the emergency  department, the patient had a low-grade temperature 9 9.4 F. She was stable on BiPAP with FiO2 35% with oxygen saturation of 100%. BMP was unremarkable except for serum creatinine 6.89. LFTs were unremarkable. EKG showed sinus rhythm with left bundle branch block. Chest x-ray showed pulmonary vascular congestion and small bilateral pleural effusions. GI and nephrology were consulted to assist with management.   Discharge Diagnoses:  Hematochezia/acute blood loss anemia -Holding Brilinta--restart 03/13/19 -GI consult--discussed Dr. Laural Golden  -Based on hemoglobin 8-9 -Patient has reported rash to Protonix -She is on omeprazole at home -Restart omeprazole  -2 units PRBCs have been given -03/07/19--capsule study---Bulbar duodenitis without stigmata of bleed.; Jejunal AV malformations without stigmata of bleed.; Jejunal diverticulum without stigmata of bleed. -8/29 colonoscopy--diverticulosis; one large polyp in transverse colon removed piemeal; patent ileocolonic anastomosis -suspect Hgb up to 12.0 (from 6.8) is spurious with 2 units PRBC -Hgb 10.1 on day of d/c -8/30--discussed with Dr. Majel Homer for d/c  ESRD -Nephrology has been consulted -She is on dialysis Monday/Wednesday/Friday -HD on 03/08/19  Flash pulmonary edema/Acute respiratory failure with hypoxia -Currently stable on BiPAP -due to blood loss anemia -Nephrology consulted for dialysis -HD on 03/08/19 -Weaned off nitroglycerin drip -12/14/2018 Echo EF 60 to 65%, positive diastolic dysfunction, mild to moderate MR -SARS-CoV2--neg  Paroxysmal atrial fibrillation/SVT -Restarted amiodarone -Restarted carvedilol  -currently in sinus -poor candidate for anticoagulation due to recurrent GIB  Essential hypertension -Continue carvedilol, clonidine, Imdur  Coronary artery disease -No chest pain presently -Restart Imdur, carvedilol, Zocor -Aspirin and Brilinta on hold secondary to GI bleed -repeatcath in 07/2018  showing grafts with occlusion of D1 at prior stent site and progression of PDA disease -->medical management recommended.  Hyperglycemia -stress induced -no hx of DM   Discharge Instructions   Allergies as of 03/10/2019  Reactions   Aspirin Other (See Comments)   High Doses Mess up her stomach; "makes my bowels have blood in them". Takes 81 mg EC Aspirin    Penicillins Other (See Comments)   SYNCOPE? , "makes me real weak when I take it; like I'll pass out" Has patient had a PCN reaction causing immediate rash, facial/tongue/throat swelling, SOB or lightheadedness with hypotension: Yes Has patient had a PCN reaction causing severe rash involving mucus membranes or skin necrosis: no Has patient had a PCN reaction that required hospitalization no Has patient had a PCN reaction occurring within the last 10 years: no If all of the above   Amlodipine Swelling   Bactrim [sulfamethoxazole-trimethoprim] Rash   Contrast Media [iodinated Diagnostic Agents] Itching   Iron Itching, Other (See Comments)   "they gave me iron in dialysis; had to give me Benadryl cause I had to have the iron" (05/02/2012)   Nitrofurantoin Hives   Tylenol [acetaminophen] Itching, Other (See Comments)   Makes her feet on fire per pt   Gabapentin Other (See Comments)   Unknown reaction   Hydralazine Itching   Dexilant [dexlansoprazole] Other (See Comments)   Upset stomach   Levaquin [levofloxacin In D5w] Rash   Morphine And Related Itching, Other (See Comments)   Itching in feet   Plavix [clopidogrel Bisulfate] Rash   Protonix [pantoprazole Sodium] Rash   Venofer [ferric Oxide] Itching, Other (See Comments)   Patient reports using Benadryl prior to doses as [place]      Medication List    TAKE these medications   ALPRAZolam 0.5 MG tablet Commonly known as: XANAX Take 0.5 mg by mouth daily.   amiodarone 200 MG tablet Commonly known as: PACERONE Take 1 tablet (200 mg total) by mouth  daily.   aspirin EC 81 MG tablet Take 1 tablet (81 mg total) by mouth daily. Restart on 03/13/19 Start taking on: March 13, 2019 What changed:   additional instructions  These instructions start on March 13, 2019. If you are unsure what to do until then, ask your doctor or other care provider.   carvedilol 12.5 MG tablet Commonly known as: COREG Take 1 tablet (12.5 mg total) by mouth 2 (two) times daily with a meal for 30 days. Additional refills per PCP   cloNIDine 0.1 MG tablet Commonly known as: Catapres Take 1 tablet (0.1 mg total) by mouth 2 (two) times daily.   diphenhydrAMINE 25 mg capsule Commonly known as: BENADRYL Take 50 mg by mouth daily as needed for itching or allergies.   fluticasone 50 MCG/ACT nasal spray Commonly known as: FLONASE Place 1 spray at bedtime as needed into both nostrils for allergies.   guaiFENesin-codeine 100-10 MG/5ML syrup Commonly known as: ROBITUSSIN AC Take 5 mLs by mouth 3 (three) times daily as needed for cough.   isosorbide mononitrate 60 MG 24 hr tablet Commonly known as: IMDUR Take 1 tablet (60 mg total) by mouth 2 (two) times a day for 30 days.   lidocaine-prilocaine cream Commonly known as: EMLA Apply 1 application topically every Monday, Wednesday, and Friday. Prior to dialysis   loratadine 10 MG tablet Commonly known as: CLARITIN Take 1 tablet (10 mg total) by mouth daily as needed for allergies.   multivitamin Tabs tablet Take 1 tablet by mouth daily.   nitroGLYCERIN 0.4 MG SL tablet Commonly known as: NITROSTAT DISSOLVE ONE TABLET UNDER THE TONGUE EVERY 5 MINUTES AS NEEDED FOR CHEST PAIN.  DO NOT EXCEED A TOTAL OF 3  DOSES IN 15 MINUTES   omeprazole 20 MG capsule Commonly known as: PRILOSEC Take 1 capsule by mouth once daily   simvastatin 20 MG tablet Commonly known as: ZOCOR TAKE ONE TABLET (20 MG) BY MOUTH DAILY AT BEDTIME   ticagrelor 60 MG Tabs tablet Commonly known as: Brilinta Take 1 tablet (60 mg  total) by mouth 2 (two) times daily. Restart on 03/13/19 Start taking on: March 13, 2019 What changed:   additional instructions  These instructions start on March 13, 2019. If you are unsure what to do until then, ask your doctor or other care provider.       Allergies  Allergen Reactions   Aspirin Other (See Comments)    High Doses Mess up her stomach; "makes my bowels have blood in them". Takes 81 mg EC Aspirin    Penicillins Other (See Comments)    SYNCOPE? , "makes me real weak when I take it; like I'll pass out"  Has patient had a PCN reaction causing immediate rash, facial/tongue/throat swelling, SOB or lightheadedness with hypotension: Yes Has patient had a PCN reaction causing severe rash involving mucus membranes or skin necrosis: no Has patient had a PCN reaction that required hospitalization no Has patient had a PCN reaction occurring within the last 10 years: no If all of the above   Amlodipine Swelling   Bactrim [Sulfamethoxazole-Trimethoprim] Rash   Contrast Media [Iodinated Diagnostic Agents] Itching   Iron Itching and Other (See Comments)    "they gave me iron in dialysis; had to give me Benadryl cause I had to have the iron" (05/02/2012)   Nitrofurantoin Hives   Tylenol [Acetaminophen] Itching and Other (See Comments)    Makes her feet on fire per pt   Gabapentin Other (See Comments)    Unknown reaction   Hydralazine Itching   Dexilant [Dexlansoprazole] Other (See Comments)    Upset stomach   Levaquin [Levofloxacin In D5w] Rash   Morphine And Related Itching and Other (See Comments)    Itching in feet   Plavix [Clopidogrel Bisulfate] Rash   Protonix [Pantoprazole Sodium] Rash   Venofer [Ferric Oxide] Itching and Other (See Comments)    Patient reports using Benadryl prior to doses as [place]    Consultations:  GI--Rehman   Procedures/Studies: Ct Abdomen Pelvis Wo Contrast  Result Date: 03/02/2019 CLINICAL DATA:  Black  stools for 3 days.  Abdominal pain. EXAM: CT ABDOMEN AND PELVIS WITHOUT CONTRAST TECHNIQUE: Multidetector CT imaging of the abdomen and pelvis was performed following the standard protocol without IV contrast. COMPARISON:  02/14/2019 FINDINGS: Lower chest: Bronchial wall thickening noted both lower lobes with interstitial and ground-glass airspace opacity, right greater than left. Hepatobiliary: No focal abnormality in the liver on this study without intravenous contrast. There is no evidence for gallstones, gallbladder wall thickening, or pericholecystic fluid. No intrahepatic or extrahepatic biliary dilation. Pancreas: No focal mass lesion. No dilatation of the main duct. No intraparenchymal cyst. No peripancreatic edema. Spleen: No splenomegaly. No focal mass lesion. Adrenals/Urinary Tract: No adrenal nodule or mass. Kidneys are atrophic with multiple low-density and intermediate density lesions, incompletely characterized but similar to prior. No evidence for hydroureter. Bladder decompressed. Stomach/Bowel: Stomach is unremarkable. No gastric wall thickening. No evidence of outlet obstruction. Duodenum is normally positioned as is the ligament of Treitz. No small bowel wall thickening. No small bowel dilatation. The appendix is not visualized, but there is no edema or inflammation in the region of the cecum. Apparent staple line noted in  the right colon. Diverticuli are seen scattered along the entire length of the colon without CT findings of diverticulitis. Left colonic anastomosis noted near the rectosigmoid junction. Vascular/Lymphatic: There is abdominal aortic atherosclerosis without aneurysm. There is no gastrohepatic or hepatoduodenal ligament lymphadenopathy. No intraperitoneal or retroperitoneal lymphadenopathy. No pelvic sidewall lymphadenopathy. Reproductive: The uterus is surgically absent. There is no adnexal mass. Other: No intraperitoneal free fluid. Musculoskeletal: No worrisome lytic or  sclerotic osseous abnormality. IMPRESSION: 1. Interval development of interstitial and basilar airspace opacity in both lungs. Basilar infection/inflammation a consideration. 2. Bilateral lesions of varying size and attenuation again noted in the atrophic kidneys. The 3.0 x 2.3 cm heterogeneous lesion in question involving the upper pole right kidney is stable. While this cannot be definitively characterized, is not substantially changed in 1 year since 12/28/2017. Continued attention on follow-up recommended. 3. Diffuse diverticulosis without diverticulitis 4.  Aortic Atherosclerois (ICD10-170.0) Electronically Signed   By: Misty Stanley M.D.   On: 03/02/2019 19:56   Ct Abdomen Pelvis Wo Contrast  Result Date: 02/14/2019 CLINICAL DATA:  78 year old female with history of indeterminate lesion in the upper pole of the right kidney. Follow-up study. EXAM: CT ABDOMEN AND PELVIS WITHOUT CONTRAST TECHNIQUE: Multidetector CT imaging of the abdomen and pelvis was performed following the standard protocol without IV contrast. COMPARISON:  CT the abdomen and pelvis 12/28/2017. FINDINGS: Lower chest: Scattered areas of mild cylindrical bronchiectasis and scarring in the lower lungs bilaterally. Atherosclerotic calcifications in the descending thoracic aorta as well as the left anterior descending, left circumflex and right coronary arteries. Hepatobiliary: No definite cystic or solid hepatic lesions are confidently identified on today's noncontrast CT examination. Amorphous intermediate attenuation lying dependently in the gallbladder likely to reflect biliary sludge. No findings to suggest an acute cholecystitis at this time. Pancreas: No definite pancreatic mass or peripancreatic fluid collections or inflammatory changes are noted on today's noncontrast CT examination. Spleen: Unremarkable. Adrenals/Urinary Tract: Atrophy of both kidneys. Multiple lesions in both kidneys, most of which are low-attenuation, none of which  are characterized on today's noncontrast CT examination. There is again an exophytic lesion in the upper pole of the right kidney which currently measures 3.2 x 2.3 cm and is intermediate attenuation (30 HU), similar to the prior examinations. Multiple calcifications associated with renal hila bilaterally appear to be vascular. Stomach/Bowel: Normal appearance of the stomach. No pathologic dilatation of small bowel or colon. Suture line near the rectosigmoid junction suggesting prior partial colectomy. Numerous colonic diverticulae are noted, particularly in the descending colon and sigmoid colon, without surrounding inflammatory changes to suggest an acute diverticulitis at this time. The appendix is not confidently identified and may be surgically absent. Regardless, there are no inflammatory changes noted adjacent to the cecum to suggest the presence of an acute appendicitis at this time. Vascular/Lymphatic: Aortic atherosclerosis. No lymphadenopathy noted in the abdomen or pelvis. Reproductive: Status post hysterectomy. Ovaries are not confidently identified may be surgically absent or atrophic. Other: No significant volume of ascites.  No pneumoperitoneum. Musculoskeletal: There are no aggressive appearing lytic or blastic lesions noted in the visualized portions of the skeleton. IMPRESSION: 1. Indeterminate lesion in the upper pole of the right kidney is stable compared to the prior study and favored to be benign, or a slow growing low-grade renal neoplasm. 2. Aortic atherosclerosis, in addition to at least 3 vessel coronary artery disease. Assessment for potential risk factor modification, dietary therapy or pharmacologic therapy may be warranted, if clinically indicated. 3. Severe colonic diverticulosis without  evidence of acute diverticulitis at this time. 4. Aortic atherosclerosis. 5. Additional incidental findings, as above. Electronically Signed   By: Vinnie Langton M.D.   On: 02/14/2019 16:29   Dg  Chest Port 1 View  Result Date: 03/08/2019 CLINICAL DATA:  Lower GI bleed EXAM: PORTABLE CHEST 1 VIEW COMPARISON:  03/02/2019 FINDINGS: Small bilateral pleural effusions. Cardiomegaly with vascular congestion and diffuse interstitial opacity compatible with edema. Basilar consolidations. No pneumothorax. Post sternotomy changes. IMPRESSION: 1. Cardiomegaly with worsened vascular congestion and increased interstitial edema. Interim development of small bilateral pleural effusions and basilar atelectasis or pneumonia Electronically Signed   By: Donavan Foil M.D.   On: 03/08/2019 02:00   Dg Chest Port 1 View  Result Date: 03/02/2019 CLINICAL DATA:  Shortness breath. Cough EXAM: PORTABLE CHEST 1 VIEW COMPARISON:  Chest radiograph 02/05/2019. Lung bases from abdominal CT earlier this day. FINDINGS: Post median sternotomy. Similar cardiomegaly. Unchanged mediastinal contours. Increased interstitial and bronchial thickening since prior exam. Linear scarring in the left mid lung. No pleural effusion or pneumothorax. Stable osseous structures. IMPRESSION: Increased interstitial and bronchial thickening since prior exam, favoring pulmonary edema. Acute infection is also considered. Stable cardiomegaly. Electronically Signed   By: Keith Rake M.D.   On: 03/02/2019 20:59        Discharge Exam: Vitals:   03/10/19 1000 03/10/19 1138  BP: (!) 130/45   Pulse: 71 61  Resp: 18 16  Temp:  (!) 97.4 F (36.3 C)  SpO2: 96% 94%   Vitals:   03/10/19 0900 03/10/19 0946 03/10/19 1000 03/10/19 1138  BP:  (!) 144/49 (!) 130/45   Pulse: 73  71 61  Resp: (!) 26  18 16   Temp:    (!) 97.4 F (36.3 C)  TempSrc:    Oral  SpO2: 95%  96% 94%  Weight:      Height:        General: Pt is alert, awake, not in acute distress Cardiovascular: RRR, S1/S2 +, no rubs, no gallops Respiratory: CTA bilaterally, no wheezing, no rhonchi Abdominal: Soft, NT, ND, bowel sounds + Extremities: no edema, no cyanosis   The  results of significant diagnostics from this hospitalization (including imaging, microbiology, ancillary and laboratory) are listed below for reference.    Significant Diagnostic Studies: Ct Abdomen Pelvis Wo Contrast  Result Date: 03/02/2019 CLINICAL DATA:  Black stools for 3 days.  Abdominal pain. EXAM: CT ABDOMEN AND PELVIS WITHOUT CONTRAST TECHNIQUE: Multidetector CT imaging of the abdomen and pelvis was performed following the standard protocol without IV contrast. COMPARISON:  02/14/2019 FINDINGS: Lower chest: Bronchial wall thickening noted both lower lobes with interstitial and ground-glass airspace opacity, right greater than left. Hepatobiliary: No focal abnormality in the liver on this study without intravenous contrast. There is no evidence for gallstones, gallbladder wall thickening, or pericholecystic fluid. No intrahepatic or extrahepatic biliary dilation. Pancreas: No focal mass lesion. No dilatation of the main duct. No intraparenchymal cyst. No peripancreatic edema. Spleen: No splenomegaly. No focal mass lesion. Adrenals/Urinary Tract: No adrenal nodule or mass. Kidneys are atrophic with multiple low-density and intermediate density lesions, incompletely characterized but similar to prior. No evidence for hydroureter. Bladder decompressed. Stomach/Bowel: Stomach is unremarkable. No gastric wall thickening. No evidence of outlet obstruction. Duodenum is normally positioned as is the ligament of Treitz. No small bowel wall thickening. No small bowel dilatation. The appendix is not visualized, but there is no edema or inflammation in the region of the cecum. Apparent staple line noted in the  right colon. Diverticuli are seen scattered along the entire length of the colon without CT findings of diverticulitis. Left colonic anastomosis noted near the rectosigmoid junction. Vascular/Lymphatic: There is abdominal aortic atherosclerosis without aneurysm. There is no gastrohepatic or hepatoduodenal  ligament lymphadenopathy. No intraperitoneal or retroperitoneal lymphadenopathy. No pelvic sidewall lymphadenopathy. Reproductive: The uterus is surgically absent. There is no adnexal mass. Other: No intraperitoneal free fluid. Musculoskeletal: No worrisome lytic or sclerotic osseous abnormality. IMPRESSION: 1. Interval development of interstitial and basilar airspace opacity in both lungs. Basilar infection/inflammation a consideration. 2. Bilateral lesions of varying size and attenuation again noted in the atrophic kidneys. The 3.0 x 2.3 cm heterogeneous lesion in question involving the upper pole right kidney is stable. While this cannot be definitively characterized, is not substantially changed in 1 year since 12/28/2017. Continued attention on follow-up recommended. 3. Diffuse diverticulosis without diverticulitis 4.  Aortic Atherosclerois (ICD10-170.0) Electronically Signed   By: Misty Stanley M.D.   On: 03/02/2019 19:56   Ct Abdomen Pelvis Wo Contrast  Result Date: 02/14/2019 CLINICAL DATA:  78 year old female with history of indeterminate lesion in the upper pole of the right kidney. Follow-up study. EXAM: CT ABDOMEN AND PELVIS WITHOUT CONTRAST TECHNIQUE: Multidetector CT imaging of the abdomen and pelvis was performed following the standard protocol without IV contrast. COMPARISON:  CT the abdomen and pelvis 12/28/2017. FINDINGS: Lower chest: Scattered areas of mild cylindrical bronchiectasis and scarring in the lower lungs bilaterally. Atherosclerotic calcifications in the descending thoracic aorta as well as the left anterior descending, left circumflex and right coronary arteries. Hepatobiliary: No definite cystic or solid hepatic lesions are confidently identified on today's noncontrast CT examination. Amorphous intermediate attenuation lying dependently in the gallbladder likely to reflect biliary sludge. No findings to suggest an acute cholecystitis at this time. Pancreas: No definite pancreatic  mass or peripancreatic fluid collections or inflammatory changes are noted on today's noncontrast CT examination. Spleen: Unremarkable. Adrenals/Urinary Tract: Atrophy of both kidneys. Multiple lesions in both kidneys, most of which are low-attenuation, none of which are characterized on today's noncontrast CT examination. There is again an exophytic lesion in the upper pole of the right kidney which currently measures 3.2 x 2.3 cm and is intermediate attenuation (30 HU), similar to the prior examinations. Multiple calcifications associated with renal hila bilaterally appear to be vascular. Stomach/Bowel: Normal appearance of the stomach. No pathologic dilatation of small bowel or colon. Suture line near the rectosigmoid junction suggesting prior partial colectomy. Numerous colonic diverticulae are noted, particularly in the descending colon and sigmoid colon, without surrounding inflammatory changes to suggest an acute diverticulitis at this time. The appendix is not confidently identified and may be surgically absent. Regardless, there are no inflammatory changes noted adjacent to the cecum to suggest the presence of an acute appendicitis at this time. Vascular/Lymphatic: Aortic atherosclerosis. No lymphadenopathy noted in the abdomen or pelvis. Reproductive: Status post hysterectomy. Ovaries are not confidently identified may be surgically absent or atrophic. Other: No significant volume of ascites.  No pneumoperitoneum. Musculoskeletal: There are no aggressive appearing lytic or blastic lesions noted in the visualized portions of the skeleton. IMPRESSION: 1. Indeterminate lesion in the upper pole of the right kidney is stable compared to the prior study and favored to be benign, or a slow growing low-grade renal neoplasm. 2. Aortic atherosclerosis, in addition to at least 3 vessel coronary artery disease. Assessment for potential risk factor modification, dietary therapy or pharmacologic therapy may be  warranted, if clinically indicated. 3. Severe colonic diverticulosis without  evidence of acute diverticulitis at this time. 4. Aortic atherosclerosis. 5. Additional incidental findings, as above. Electronically Signed   By: Vinnie Langton M.D.   On: 02/14/2019 16:29   Dg Chest Port 1 View  Result Date: 03/08/2019 CLINICAL DATA:  Lower GI bleed EXAM: PORTABLE CHEST 1 VIEW COMPARISON:  03/02/2019 FINDINGS: Small bilateral pleural effusions. Cardiomegaly with vascular congestion and diffuse interstitial opacity compatible with edema. Basilar consolidations. No pneumothorax. Post sternotomy changes. IMPRESSION: 1. Cardiomegaly with worsened vascular congestion and increased interstitial edema. Interim development of small bilateral pleural effusions and basilar atelectasis or pneumonia Electronically Signed   By: Donavan Foil M.D.   On: 03/08/2019 02:00   Dg Chest Port 1 View  Result Date: 03/02/2019 CLINICAL DATA:  Shortness breath. Cough EXAM: PORTABLE CHEST 1 VIEW COMPARISON:  Chest radiograph 02/05/2019. Lung bases from abdominal CT earlier this day. FINDINGS: Post median sternotomy. Similar cardiomegaly. Unchanged mediastinal contours. Increased interstitial and bronchial thickening since prior exam. Linear scarring in the left mid lung. No pleural effusion or pneumothorax. Stable osseous structures. IMPRESSION: Increased interstitial and bronchial thickening since prior exam, favoring pulmonary edema. Acute infection is also considered. Stable cardiomegaly. Electronically Signed   By: Keith Rake M.D.   On: 03/02/2019 20:59     Microbiology: Recent Results (from the past 240 hour(s))  SARS Coronavirus 2 [facility] order, Performed in [facility] hospital lab) Nasopharyngeal Nasopharyngeal Swab     Status: None   Collection Time: 03/08/19 12:55 AM   Specimen: Nasopharyngeal Swab  Result Value Ref Range Status   SARS Coronavirus 2 NEGATIVE NEGATIVE Final    Comment: (NOTE) If result is  NEGATIVE SARS-CoV-2 target nucleic acids are NOT DETECTED. The SARS-CoV-2 RNA is generally detectable in upper and lower  respiratory specimens during the acute phase of infection. The lowest  concentration of SARS-CoV-2 viral copies this assay can detect is 250  copies / mL. A negative result does not preclude SARS-CoV-2 infection  and should not be used as the sole basis for treatment or other  patient management decisions.  A negative result may occur with  improper specimen collection / handling, submission of specimen other  than nasopharyngeal swab, presence of viral mutation(s) within the  areas targeted by this assay, and inadequate number of viral copies  (<250 copies / mL). A negative result must be combined with clinical  observations, patient history, and epidemiological information. If result is POSITIVE SARS-CoV-2 target nucleic acids are DETECTED. The SARS-CoV-2 RNA is generally detectable in upper and lower  respiratory specimens dur ing the acute phase of infection.  Positive  results are indicative of active infection with SARS-CoV-2.  Clinical  correlation with patient history and other diagnostic information is  necessary to determine patient infection status.  Positive results do  not rule out bacterial infection or co-infection with other viruses. If result is PRESUMPTIVE POSTIVE SARS-CoV-2 nucleic acids MAY BE PRESENT.   A presumptive positive result was obtained on the submitted specimen  and confirmed on repeat testing.  While 2019 novel coronavirus  (SARS-CoV-2) nucleic acids may be present in the submitted sample  additional confirmatory testing may be necessary for epidemiological  and / or clinical management purposes  to differentiate between  SARS-CoV-2 and other Sarbecovirus currently known to infect humans.  If clinically indicated additional testing with an alternate test  methodology [phone]) is advised. The SARS-CoV-2 RNA is generally  detectable  in upper and lower respiratory sp ecimens during the acute  phase of infection.  The expected result is Negative. Fact Sheet for Patients:  StrictlyIdeas.no Fact Sheet for Healthcare Providers: BankingDealers.co.za This test is not yet approved or cleared by the [place] FDA and has been authorized for detection and/or diagnosis of SARS-CoV-2 by FDA under an Emergency Use Authorization (EUA).  This EUA will remain in effect (meaning this test can be used) for the duration of the COVID-19 declaration under Section 564(b)(1) of the Act, 21 U.S.C. section 360bbb-3(b)(1), unless the authorization is terminated or revoked sooner. Performed at [facility], [address] [place], [place] [address]   MRSA PCR Screening     Status: None   Collection Time: 03/08/19  9:54 AM   Specimen: Nasal Mucosa; Nasopharyngeal  Result Value Ref Range Status   MRSA by PCR NEGATIVE NEGATIVE Final    Comment:        The GeneXpert MRSA Assay (FDA approved for NASAL specimens only), is one component of a comprehensive MRSA colonization surveillance program. It is not intended to diagnose MRSA infection nor to guide or monitor treatment for MRSA infections. Performed at [facility], [address], [place], [place] [address]      Labs: Basic Metabolic Panel: Recent Labs  Lab 03/05/19 1159 03/07/19 2356 03/08/19 0954 03/09/19 0444  NA 139 142 144   143 139  K 4.1 4.2 4.8   4.8 3.6  CL 98 100 104   103 96*  CO2 30 28 27   27 31   GLUCOSE 111 129* 103*   103* 97  BUN 35* 35* 41*   39* 19  CREATININE 6.57* 6.89* 7.98*   7.99* 5.28*  CALCIUM 9.1 9.3 9.0   9.0 9.5  PHOS  --   --  5.9* 5.0*   Liver Function Tests: Recent Labs  Lab 03/05/19 1159 03/07/19 2356 03/08/19 0954 03/09/19 0444  AST 4* 9* 9*  --   ALT 4* 9 8  --   ALKPHOS  --  59 54  --   BILITOT 0.7 0.5 0.5  --   PROT 5.6* 6.1* 5.5*  --   ALBUMIN  --  3.4* 3.0*   3.0* 3.1*    No results for input(s): LIPASE, AMYLASE in the last 168 hours. No results for input(s): AMMONIA in the last 168 hours. CBC: Recent Labs  Lab 03/05/19 1159 03/07/19 2356 03/08/19 0512 03/08/19 0954 03/08/19 1915 03/09/19 0443 03/10/19 0457  WBC 6.9 7.6 11.7* 7.1  --  6.3 6.9  NEUTROABS 5,134 5.9 9.5*  --   --   --   --   HGB 8.6* 7.3* 7.5* 6.8* 12.1 12.0 10.1*  HCT 26.9* 24.0* 23.5* 23.2* 38.3 38.1 32.2*  MCV 106.3* 114.3* 107.8* 114.9*  --  103.0* 102.9*  PLT 232 223 215 216  --  175 192   Cardiac Enzymes: No results for input(s): CKTOTAL, CKMB, CKMBINDEX, TROPONINI in the last 168 hours. BNP: Invalid input(s): POCBNP CBG: No results for input(s): GLUCAP in the last 168 hours.  Time coordinating discharge:  36 minutes  Signed:  Orson Eva, DO [facility] Pager: [phone] 03/10/2019, 1:40 PM

## 2019-03-10 NOTE — Progress Notes (Signed)
Pt admitted to [facility]

## 2019-03-11 ENCOUNTER — Encounter (HOSPITAL_COMMUNITY): Payer: Self-pay | Admitting: Internal Medicine

## 2019-03-11 NOTE — Telephone Encounter (Signed)
Patient discharged from the hospital yesterday.

## 2019-03-12 ENCOUNTER — Encounter (INDEPENDENT_AMBULATORY_CARE_PROVIDER_SITE_OTHER): Payer: Self-pay | Admitting: Nurse Practitioner

## 2019-03-12 ENCOUNTER — Ambulatory Visit (INDEPENDENT_AMBULATORY_CARE_PROVIDER_SITE_OTHER): Payer: Medicare HMO | Admitting: Nurse Practitioner

## 2019-03-12 ENCOUNTER — Other Ambulatory Visit: Payer: Self-pay

## 2019-03-12 ENCOUNTER — Ambulatory Visit (INDEPENDENT_AMBULATORY_CARE_PROVIDER_SITE_OTHER): Payer: Medicare HMO

## 2019-03-12 VITALS — BP 134/68 | HR 72 | Resp 12 | Ht 62.0 in | Wt 124.0 lb

## 2019-03-12 DIAGNOSIS — N186 End stage renal disease: Secondary | ICD-10-CM

## 2019-03-12 DIAGNOSIS — I10 Essential (primary) hypertension: Secondary | ICD-10-CM | POA: Diagnosis not present

## 2019-03-12 DIAGNOSIS — K219 Gastro-esophageal reflux disease without esophagitis: Secondary | ICD-10-CM | POA: Diagnosis not present

## 2019-03-12 DIAGNOSIS — Z992 Dependence on renal dialysis: Secondary | ICD-10-CM | POA: Diagnosis not present

## 2019-03-13 ENCOUNTER — Telehealth (INDEPENDENT_AMBULATORY_CARE_PROVIDER_SITE_OTHER): Payer: Self-pay

## 2019-03-13 ENCOUNTER — Other Ambulatory Visit: Payer: Self-pay | Admitting: Cardiovascular Disease

## 2019-03-13 MED ORDER — CARVEDILOL 12.5 MG PO TABS: 12.5000 mg | ORAL_TABLET | Freq: Two times a day (BID) | ORAL | 1 refills | Status: DC

## 2019-03-13 NOTE — Telephone Encounter (Signed)
°*  STAT* If patient is at the pharmacy, call can be transferred to refill team.   1. Which medications need to be refilled? carvedilol (COREG) 12.5 MG tablet  2. Which pharmacy/location (including street and city if local pharmacy) is medication to be sent to? [facility] in [place]. Do they need a 30 day or 90 day supply?

## 2019-03-13 NOTE — Telephone Encounter (Signed)
Spoke with the patient and she is now scheduled with Dr. Delana Meyer for and angio on 03/19/2019 with a 6:45 am arrival time to the [facility]. Patient will do her Covid testing at the [facility] on 03/15/2019 before 8:00 am. This information will be mailed  To the patient.

## 2019-03-14 ENCOUNTER — Other Ambulatory Visit (INDEPENDENT_AMBULATORY_CARE_PROVIDER_SITE_OTHER): Payer: Self-pay | Admitting: Nurse Practitioner

## 2019-03-14 ENCOUNTER — Encounter (INDEPENDENT_AMBULATORY_CARE_PROVIDER_SITE_OTHER): Payer: Self-pay | Admitting: Nurse Practitioner

## 2019-03-14 NOTE — Progress Notes (Signed)
SUBJECTIVE:  Patient ID: Shawna [name], female    DOB: 1940/07/14, 78 y.o.   MRN: [id number] Chief Complaint  Patient presents with  . Follow-up    HPI  Shawna [name] is a 78 y.o. female that presents today with referral from her dialysis center due to bleeding around her needle site during treatment.  The patient states that this is happened several times.  She states that it is not a small amount of blood but rather a strong trickle.  She states that this is something fairly recently that has been happening.  It has not stopped her from receiving her treatments.  The patient has done remarkably well with her fistula as she has had it for approximately 9 years or so with no interventions.  She denies any fevers, chills, nausea, vomiting or diarrhea.  She denies any chest pain or shortness of breath.  Noninvasive studies show today a flow volume of 1634.  She has a patent AV graft with a mild increase at the confluence with no internal stenosis visualized.  Past Medical History:  Diagnosis Date  . Acute on chronic respiratory failure with hypoxia ([place]) 10/10/2016  . Anxiety   . Arthritis   . AVM (arteriovenous malformation) of colon   . CAD (coronary artery disease)    a. s/p CABG in 2013 b. most recent DES to D1 in 10/2016 with cath showing patent LIMA-LAD, SVG-RI, SVG-Mrg and SVG-RCA  c. NST in 02/2018 showing scar with no significant ischemia  . Carotid artery disease ([place])    a. 22-02% LICA, 11/4268   . Chronic bronchitis ([place])   . Chronic diastolic CHF (congestive heart failure) ([place])    a. 02/2012 Echo EF 60-65%, nl wall motion, Gr 1 DD, mod MR  . Colon cancer ([place]) 1992  . Esophageal stricture   . ESRD on hemodialysis ([place])    ESRD due to HTN, started dialysis 2011 and gets HD at [facility] with Dr Hinda Lenis on MWF schedule.  Access is LUA AVF as of Sept 2014.   Marland Kitchen GERD (gastroesophageal reflux disease)   . High cholesterol 12/2011  . History of blood transfusion 07/2011; 12/2011;  01/2012 X 2; 04/2012  . History of gout   . History of lower GI bleeding   . Hypertension   . Iron deficiency anemia   . Mitral regurgitation    a. Moderate by echo, 02/2012  . Myocardial infarction ([place])   . Ovarian cancer ([place]) 1992  . Pneumonia ~ 2009  . PUD (peptic ulcer disease)   . TIA (transient ischemic attack)     Past Surgical History:  Procedure Laterality Date  . ABDOMINAL HYSTERECTOMY  1992  . APPENDECTOMY  06/1990  . AV FISTULA PLACEMENT  07/2009   left upper arm  . AV FISTULA PLACEMENT Right 09/06/2016   Procedure: RIGHT FOREARM ARTERIOVENOUS (AV) GRAFT;  Surgeon: Elam Dutch, MD;  Location: [facility] OR;  Service: Vascular;  Laterality: Right;  . AV FISTULA PLACEMENT N/A 02/24/2017   Procedure: INSERTION OF ARTERIOVENOUS (AV) GORE-TEX GRAFT ARM (BRACHIAL AXILLARY);  Surgeon: Katha Cabal, MD;  Location: [facility] ORS;  Service: Vascular;  Laterality: N/A;  . [place] Right 09/06/2016   Procedure: REMOVAL OF Right Arm ARTERIOVENOUS GORETEX GRAFT and Vein Patch angioplasty of brachial artery;  Surgeon: Angelia Mould, MD;  Location: [place];  Service: Vascular;  Laterality: Right;  . COLON RESECTION  1992  . COLON SURGERY    . COLONOSCOPY N/A 03/09/2019  Procedure: COLONOSCOPY;  Surgeon: Rogene Houston, MD;  [facility];  Service: Endoscopy;  Laterality: N/A;  . CORONARY ANGIOPLASTY WITH STENT PLACEMENT  12/15/11   "2"  . CORONARY ANGIOPLASTY WITH STENT PLACEMENT  y/2013   "1; makes total of 3" (05/02/2012)  . CORONARY ARTERY BYPASS GRAFT  06/13/2012   Procedure: CORONARY ARTERY BYPASS GRAFTING (CABG);  Surgeon: Grace Isaac, MD;  Location: [place];  Service: Open Heart Surgery;  Laterality: N/A;  cabg x four;  using left internal mammary artery, and left leg greater saphenous vein harvested endoscopically  . CORONARY STENT INTERVENTION N/A 10/13/2016   Procedure: Coronary Stent Intervention;  Surgeon: Troy Sine, MD;  Location: [place] CV LAB;   Service: Cardiovascular;  Laterality: N/A;  . DIALYSIS/PERMA CATHETER REMOVAL N/A 04/18/2017   Procedure: DIALYSIS/PERMA CATHETER REMOVAL;  Surgeon: Katha Cabal, MD;  Location: [place] CV LAB;  Service: Cardiovascular;  Laterality: N/A;  . DILATION AND CURETTAGE OF UTERUS    . ESOPHAGOGASTRODUODENOSCOPY  01/20/2012   Procedure: ESOPHAGOGASTRODUODENOSCOPY (EGD);  Surgeon: Ladene Artist, MD,FACG;  Location: [facility] ENDOSCOPY;  Service: Endoscopy;  Laterality: N/A;  . ESOPHAGOGASTRODUODENOSCOPY N/A 03/26/2013   Procedure: ESOPHAGOGASTRODUODENOSCOPY (EGD);  Surgeon: Irene Shipper, MD;  Location: [facility] ENDOSCOPY;  Service: Endoscopy;  Laterality: N/A;  . ESOPHAGOGASTRODUODENOSCOPY N/A 04/30/2015   Procedure: ESOPHAGOGASTRODUODENOSCOPY (EGD);  Surgeon: Rogene Houston, MD;  [facility];  Service: Endoscopy;  Laterality: N/A;  1pm - moved to 10/20 @ 1:10  . ESOPHAGOGASTRODUODENOSCOPY N/A 07/29/2016   Procedure: ESOPHAGOGASTRODUODENOSCOPY (EGD);  Surgeon: Manus Gunning, MD;  Location: [place];  Service: Gastroenterology;  Laterality: N/A;  enteroscopy  . GIVENS CAPSULE STUDY N/A 03/07/2019   Procedure: GIVENS CAPSULE STUDY;  Surgeon: Rogene Houston, MD;  [facility];  Service: Endoscopy;  Laterality: N/A;  7:30  . INTRAOPERATIVE TRANSESOPHAGEAL ECHOCARDIOGRAM  06/13/2012   Procedure: INTRAOPERATIVE TRANSESOPHAGEAL ECHOCARDIOGRAM;  Surgeon: Grace Isaac, MD;  Location: [place];  Service: Open Heart Surgery;  Laterality: N/A;  . IR GENERIC HISTORICAL  07/26/2016   IR FLUORO GUIDE CV LINE RIGHT 07/26/2016 Greggory Keen, MD [facility]INTER[facility]  . IR GENERIC HISTORICAL  07/26/2016   IR US GUIDE VASC ACCESS RIGHT 07/26/2016 Greggory Keen, MD [facility]  . IR GENERIC HISTORICAL  08/02/2016   IR US GUIDE VASC ACCESS RIGHT 08/02/2016 Greggory Keen, MD [facility]  . IR GENERIC HISTORICAL  08/02/2016   IR FLUORO GUIDE CV LINE RIGHT 08/02/2016 Greggory Keen, MD [facility]   . IR RADIOLOGY PERIPHERAL GUIDED IV START  03/28/2017  . IR US GUIDE VASC ACCESS RIGHT  03/28/2017  . LEFT HEART CATH AND CORONARY ANGIOGRAPHY N/A 09/20/2016   Procedure: Left Heart Cath and Coronary Angiography;  Surgeon: Shawna Crome, MD;  Location: [place] CV LAB;  Service: Cardiovascular;  Laterality: N/A;  . LEFT HEART CATH AND CORS/GRAFTS ANGIOGRAPHY N/A 10/13/2016   Procedure: Left Heart Cath and Cors/Grafts Angiography;  Surgeon: Troy Sine, MD;  Location: [place] CV LAB;  Service: Cardiovascular;  Laterality: N/A;  . LEFT HEART CATH AND CORS/GRAFTS ANGIOGRAPHY N/A 07/13/2018   Procedure: LEFT HEART CATH AND CORS/GRAFTS ANGIOGRAPHY;  Surgeon: Martinique, Peter M, MD;  Location: [place] CV LAB;  Service: Cardiovascular;  Laterality: N/A;  . LEFT HEART CATHETERIZATION WITH CORONARY ANGIOGRAM N/A 12/15/2011   Procedure: LEFT HEART CATHETERIZATION WITH CORONARY ANGIOGRAM;  Surgeon: Burnell Blanks, MD;  [facility] LAB;  Service: Cardiovascular;  Laterality: N/A;  . LEFT HEART CATHETERIZATION WITH CORONARY ANGIOGRAM N/A 01/10/2012   Procedure: LEFT HEART CATHETERIZATION WITH CORONARY ANGIOGRAM;  Surgeon: Peter M Martinique, MD;  Location: [facility] CATH LAB;  Service: Cardiovascular;  Laterality: N/A;  . LEFT HEART CATHETERIZATION WITH CORONARY ANGIOGRAM N/A 06/08/2012   Procedure: LEFT HEART CATHETERIZATION WITH CORONARY ANGIOGRAM;  Surgeon: Burnell Blanks, MD;  Location: [facility] CATH LAB;  Service: Cardiovascular;  Laterality: N/A;  . LEFT HEART CATHETERIZATION WITH CORONARY/GRAFT ANGIOGRAM N/A 12/10/2013   Procedure: LEFT HEART CATHETERIZATION WITH Beatrix Fetters;  Surgeon: [name], MD;  Location: [facility] CATH LAB;  Service: Cardiovascular;  Laterality: N/A;  . OVARY SURGERY     ovarian cancer  . POLYPECTOMY  03/09/2019   Procedure: POLYPECTOMY;  Surgeon: Rogene Houston, MD;  [facility];  Service: Endoscopy;;  cecal   . REVISION OF ARTERIOVENOUS  GORETEX GRAFT N/A 02/24/2017   Procedure: REVISION OF ARTERIOVENOUS GORETEX GRAFT (RESECTION);  Surgeon: Katha Cabal, MD;  Location: [facility] ORS;  Service: Vascular;  Laterality: N/A;  [place] N/A 10/15/2013   Procedure: Fistulogram;  Surgeon: Serafina Mitchell, MD;  Location: [facility] CATH LAB;  Service: Cardiovascular;  Laterality: N/A;  . THROMBECTOMY / ARTERIOVENOUS GRAFT REVISION  2011   left upper arm  . TUBAL LIGATION  1980's  . UPPER EXTREMITY ANGIOGRAPHY Bilateral 12/06/2016   Procedure: Upper Extremity Angiography;  Surgeon: Katha Cabal, MD;  Location: [place] CV LAB;  Service: Cardiovascular;  Laterality: Bilateral;  . UPPER EXTREMITY INTERVENTION Left 06/06/2017   Procedure: UPPER EXTREMITY INTERVENTION;  Surgeon: Katha Cabal, MD;  Location: [place] CV LAB;  Service: Cardiovascular;  Laterality: Left;    Social History   Socioeconomic History  . Marital status: Married    Spouse name: Not on file  . Number of children: Not on file  . Years of education: Not on file  . Highest education level: Not on file  Occupational History  . Not on file  Social Needs  . Financial resource strain: Not very hard  . Food insecurity    Worry: Never true    Inability: Never true  . Transportation needs    Medical: No    Non-medical: No  Tobacco Use  . Smoking status: Never Smoker  . Smokeless tobacco: Never Used  Substance and Sexual Activity  . Alcohol use: No    Alcohol/week: 0.0 standard drinks  . Drug use: No  . Sexual activity: Yes    Birth control/protection: Surgical  Lifestyle  . Physical activity    Days per week: Patient refused    Minutes per session: Patient refused  . Stress: Only a little  Relationships  . Social Herbalist on phone: Patient refused    Gets together: Patient refused    Attends religious service: Patient refused    Active member of club or organization: Patient refused    Attends meetings of clubs or  organizations: Patient refused    Relationship status: Patient refused  . Intimate partner violence    Fear of current or ex partner: No    Emotionally abused: No    Physically abused: No    Forced sexual activity: No  Other Topics Concern  . Not on file  Social History Narrative   Lives in [place], [place] with husband.  Dialysis pt - mwf.    Family History  Problem Relation Age of Onset  . Heart disease Mother  Heart Disease before age 34  . Hyperlipidemia Mother   . Hypertension Mother   . Diabetes Mother   . Heart attack Mother   . Heart disease Father        Heart Disease before age 75  . Hyperlipidemia Father   . Hypertension Father   . Diabetes Father   . Diabetes Sister   . Hypertension Sister   . Diabetes Brother   . Hyperlipidemia Brother   . Heart attack Brother   . Hypertension Sister   . Heart attack Brother   . Other Other        noncontributory for early CAD  . Colon cancer Neg Hx   . Esophageal cancer Neg Hx   . Liver disease Neg Hx   . Kidney disease Neg Hx   . Colon polyps Neg Hx     Allergies  Allergen Reactions  . Aspirin Other (See Comments)    High Doses Mess up her stomach; "makes my bowels have blood in them". Takes 81 mg EC Aspirin   . Penicillins Other (See Comments)    SYNCOPE? , "makes me real weak when I take it; like I'll pass out"  Has patient had a PCN reaction causing immediate rash, facial/tongue/throat swelling, SOB or lightheadedness with hypotension: Yes Has patient had a PCN reaction causing severe rash involving mucus membranes or skin necrosis: no Has patient had a PCN reaction that required hospitalization no Has patient had a PCN reaction occurring within the last 10 years: no If all of the above  . Amlodipine Swelling  . Bactrim [Sulfamethoxazole-Trimethoprim] Rash  . Contrast Media [Iodinated Diagnostic Agents] Itching  . Iron Itching and Other (See Comments)    "they gave me iron in dialysis; had to give me  Benadryl cause I had to have the iron" (05/02/2012)  . Nitrofurantoin Hives  . Tylenol [Acetaminophen] Itching and Other (See Comments)    Makes her feet on fire per pt  . Gabapentin Other (See Comments)    Unknown reaction  . Hydralazine Itching  . Dexilant [Dexlansoprazole] Other (See Comments)    Upset stomach  . Levaquin [Levofloxacin In D5w] Rash  . Morphine And Related Itching and Other (See Comments)    Itching in feet  . Plavix [Clopidogrel Bisulfate] Rash  . Protonix [Pantoprazole Sodium] Rash  . Venofer [Ferric Oxide] Itching and Other (See Comments)    Patient reports using Benadryl prior to doses as [place]     Review of Systems   Review of Systems: Negative Unless Checked Constitutional: [] Weight loss  [] Fever  [] Chills Cardiac: [] Chest pain   []  Atrial Fibrillation  [] Palpitations   [] Shortness of breath when laying flat   [] Shortness of breath with exertion. [] Shortness of breath at rest Vascular:  [] Pain in legs with walking   [] Pain in legs with standing [] Pain in legs when laying flat   [] Claudication    [] Pain in feet when laying flat    [] History of DVT   [] Phlebitis   [] Swelling in legs   [] Varicose veins   [] Non-healing ulcers Pulmonary:   [] Uses home oxygen   [] Productive cough   [] Hemoptysis   [] Wheeze  [] COPD   [] Asthma Neurologic:  [] Dizziness   [] Seizures  [] Blackouts [] History of stroke   [] History of TIA  [] Aphasia   [] Temporary Blindness   [] Weakness or numbness in arm   [] Weakness or numbness in leg Musculoskeletal:   [] Joint swelling   [] Joint pain   [] Low back pain  []   History of Knee Replacement [x] Arthritis [] back Surgeries  []  Spinal Stenosis    Hematologic:  [] Easy bruising  [] Easy bleeding   [] Hypercoagulable state   [x] Anemic Gastrointestinal:  [] Diarrhea   [] Vomiting  [x] Gastroesophageal reflux/heartburn   [] Difficulty swallowing. [] Abdominal pain Genitourinary:  [x] Chronic kidney disease   [] Difficult urination  [] Anuric   [] Blood in urine  [] Frequent urination  [] Burning with urination   [] Hematuria Skin:  [] Rashes   [] Ulcers [] Wounds Psychological:  [x] History of anxiety   []  History of major depression  []  Memory Difficulties      OBJECTIVE:   Physical Exam  BP 134/68 (BP Location: Right Arm, Patient Position: Sitting, Cuff Size: Normal)   Pulse 72   Resp 12   Ht 5\' 2"  (1.575 m)   Wt 124 lb (56.2 kg)   BMI 22.68 kg/m   Gen: WD/WN, NAD Head: [place]/AT, No temporalis wasting.  Ear/Nose/Throat: Hearing grossly intact, nares w/o erythema or drainage Eyes: PER, EOMI, sclera nonicteric.  Neck: Supple, no masses.  No JVD.  Pulmonary:  Good air movement, no use of accessory muscles.  Cardiac: RRR Vascular:  Strongly pulsatile thrill and good bruit Vessel Right Left  Radial Palpable Palpable   Gastrointestinal: soft, non-distended. No guarding/no peritoneal signs.  Musculoskeletal: M/S 5/5 throughout.  No deformity or atrophy.  Neurologic: Pain and light touch intact in extremities.  Symmetrical.  Speech is fluent. Motor exam as listed above. Psychiatric: Judgment intact, Mood & affect appropriate for pt's clinical situation. Dermatologic: No Venous rashes. No Ulcers Noted.  No changes consistent with cellulitis. Lymph : No Cervical lymphadenopathy, no lichenification or skin changes of chronic lymphedema.       ASSESSMENT AND PLAN:  1. ESRD on hemodialysis [facility]) Recommend:  The patient is experiencing increasing problems with their dialysis access.  Although the noninvasive studies did not identify any specific area of stenosis, based on bleeding and pulsatility is probably stenosis in the central veins.  Patient should have a shuntogram with the intention for intervention.  The intention for intervention is to restore appropriate flow and prevent thrombosis and possible loss of the access.  As well as improve the quality of dialysis therapy.  The risks, benefits and alternative therapies were reviewed in detail  with the patient.  All questions were answered.  The patient agrees to proceed with angio/intervention.      2. Gastroesophageal reflux disease, esophagitis presence not specified Continue PPI as already ordered, this medication has been reviewed and there are no changes at this time.  Avoidence of caffeine and alcohol  Moderate elevation of the head of the bed   3. Essential hypertension Continue antihypertensive medications as already ordered, these medications have been reviewed and there are no changes at this time.    Current Outpatient Medications on File Prior to Visit  Medication Sig Dispense Refill  . ALPRAZolam (XANAX) 0.5 MG tablet Take 0.5 mg by mouth daily.    Marland Kitchen amiodarone (PACERONE) 200 MG tablet Take 1 tablet (200 mg total) by mouth daily. 90 tablet 1  . aspirin EC 81 MG tablet Take 1 tablet (81 mg total) by mouth daily. Restart on 03/13/19    . cloNIDine (CATAPRES) 0.1 MG tablet Take 1 tablet (0.1 mg total) by mouth 2 (two) times daily. 60 tablet 2  . diphenhydrAMINE (BENADRYL) 25 mg capsule Take 50 mg by mouth daily as needed for itching or allergies.     . fluticasone (FLONASE) 50 MCG/ACT nasal spray Place 1 spray at bedtime as needed  into both nostrils for allergies.     Marland Kitchen lidocaine-prilocaine (EMLA) cream Apply 1 application topically every Monday, Wednesday, and Friday. Prior to dialysis    . loratadine (CLARITIN) 10 MG tablet Take 1 tablet (10 mg total) by mouth daily as needed for allergies. 30 tablet 0  . multivitamin (RENA-VIT) TABS tablet Take 1 tablet by mouth daily.    Marland Kitchen omeprazole (PRILOSEC) 20 MG capsule Take 1 capsule by mouth once daily 90 capsule 0  . simvastatin (ZOCOR) 20 MG tablet TAKE ONE TABLET (20 MG) BY MOUTH DAILY AT BEDTIME 90 tablet 3  . ticagrelor (BRILINTA) 60 MG TABS tablet Take 1 tablet (60 mg total) by mouth 2 (two) times daily. Restart on 03/13/19 60 tablet 6  . nitroGLYCERIN (NITROSTAT) 0.4 MG SL tablet DISSOLVE ONE TABLET UNDER THE TONGUE  EVERY 5 MINUTES AS NEEDED FOR CHEST PAIN.  DO NOT EXCEED A TOTAL OF 3 DOSES IN 15 MINUTES (Patient not taking: No sig reported) 25 tablet 2   No current facility-administered medications on file prior to visit.     There are no Patient Instructions on file for this visit. No follow-ups on file.   Kris Hartmann, NP  This note was completed with Sales executive.  Any errors are purely unintentional.

## 2019-03-19 ENCOUNTER — Other Ambulatory Visit
Admission: RE | Admit: 2019-03-19 | Discharge: 2019-03-19 | Disposition: A | Discharge status: Home / Self Care | Payer: Medicare HMO | Source: Ambulatory Visit | Attending: Vascular Surgery | Admitting: Vascular Surgery

## 2019-03-19 ENCOUNTER — Encounter
Admission: RE | Disposition: A | Discharge status: Home / Self Care | Payer: Self-pay | Source: Home / Self Care | Attending: Vascular Surgery

## 2019-03-19 ENCOUNTER — Other Ambulatory Visit: Payer: Self-pay

## 2019-03-19 ENCOUNTER — Ambulatory Visit
Admission: RE | Admit: 2019-03-19 | Discharge: 2019-03-19 | Disposition: A | Discharge status: Home / Self Care | Payer: Medicare HMO | Attending: Vascular Surgery | Admitting: Vascular Surgery

## 2019-03-19 ENCOUNTER — Encounter: Payer: Self-pay | Admitting: *Deleted

## 2019-03-19 DIAGNOSIS — F419 Anxiety disorder, unspecified: Secondary | ICD-10-CM | POA: Insufficient documentation

## 2019-03-19 DIAGNOSIS — Z20828 Contact with and (suspected) exposure to other viral communicable diseases: Secondary | ICD-10-CM | POA: Insufficient documentation

## 2019-03-19 DIAGNOSIS — Z886 Allergy status to analgesic agent status: Secondary | ICD-10-CM | POA: Insufficient documentation

## 2019-03-19 DIAGNOSIS — Z885 Allergy status to narcotic agent status: Secondary | ICD-10-CM | POA: Insufficient documentation

## 2019-03-19 DIAGNOSIS — Z8543 Personal history of malignant neoplasm of ovary: Secondary | ICD-10-CM | POA: Diagnosis not present

## 2019-03-19 DIAGNOSIS — Z992 Dependence on renal dialysis: Secondary | ICD-10-CM | POA: Insufficient documentation

## 2019-03-19 DIAGNOSIS — K219 Gastro-esophageal reflux disease without esophagitis: Secondary | ICD-10-CM | POA: Diagnosis not present

## 2019-03-19 DIAGNOSIS — Z8673 Personal history of transient ischemic attack (TIA), and cerebral infarction without residual deficits: Secondary | ICD-10-CM | POA: Insufficient documentation

## 2019-03-19 DIAGNOSIS — Z7982 Long term (current) use of aspirin: Secondary | ICD-10-CM | POA: Diagnosis not present

## 2019-03-19 DIAGNOSIS — Z880 Allergy status to penicillin: Secondary | ICD-10-CM | POA: Diagnosis not present

## 2019-03-19 DIAGNOSIS — Y841 Kidney dialysis as the cause of abnormal reaction of the patient, or of later complication, without mention of misadventure at the time of the procedure: Secondary | ICD-10-CM | POA: Insufficient documentation

## 2019-03-19 DIAGNOSIS — I132 Hypertensive heart and chronic kidney disease with heart failure and with stage 5 chronic kidney disease, or end stage renal disease: Secondary | ICD-10-CM | POA: Diagnosis not present

## 2019-03-19 DIAGNOSIS — N186 End stage renal disease: Secondary | ICD-10-CM | POA: Diagnosis not present

## 2019-03-19 DIAGNOSIS — I252 Old myocardial infarction: Secondary | ICD-10-CM | POA: Diagnosis not present

## 2019-03-19 DIAGNOSIS — I5032 Chronic diastolic (congestive) heart failure: Secondary | ICD-10-CM | POA: Diagnosis not present

## 2019-03-19 DIAGNOSIS — Z79899 Other long term (current) drug therapy: Secondary | ICD-10-CM | POA: Insufficient documentation

## 2019-03-19 DIAGNOSIS — I2510 Atherosclerotic heart disease of native coronary artery without angina pectoris: Secondary | ICD-10-CM | POA: Insufficient documentation

## 2019-03-19 DIAGNOSIS — Z888 Allergy status to other drugs, medicaments and biological substances status: Secondary | ICD-10-CM | POA: Insufficient documentation

## 2019-03-19 DIAGNOSIS — T82858A Stenosis of vascular prosthetic devices, implants and grafts, initial encounter: Secondary | ICD-10-CM | POA: Insufficient documentation

## 2019-03-19 DIAGNOSIS — T82898A Other specified complication of vascular prosthetic devices, implants and grafts, initial encounter: Secondary | ICD-10-CM

## 2019-03-19 HISTORY — PX: A/V SHUNTOGRAM: CATH118297

## 2019-03-19 LAB — POTASSIUM (ARMC VASCULAR LAB ONLY): Potassium (ARMC vascular lab): 4.4 (ref 3.5–5.1)

## 2019-03-19 LAB — SARS CORONAVIRUS 2 BY RT PCR (HOSPITAL ORDER, PERFORMED IN ~~LOC~~ HOSPITAL LAB): SARS Coronavirus 2: NEGATIVE

## 2019-03-19 SURGERY — A/V SHUNTOGRAM
Anesthesia: Moderate Sedation | Laterality: Left

## 2019-03-19 MED ORDER — HEPARIN SODIUM (PORCINE) 1000 UNIT/ML IJ SOLN
INTRAMUSCULAR | Status: AC
  Filled 2019-03-19: qty 1

## 2019-03-19 MED ORDER — MIDAZOLAM HCL 2 MG/ML PO SYRP: 8.0000 mg | ORAL_SOLUTION | Freq: Once | ORAL | Status: DC | PRN

## 2019-03-19 MED ORDER — FENTANYL CITRATE (PF) 100 MCG/2ML IJ SOLN
INTRAMUSCULAR | Status: DC | PRN
  Administered 2019-03-19: 50 ug via INTRAVENOUS

## 2019-03-19 MED ORDER — FAMOTIDINE 20 MG PO TABS: 40.0000 mg | ORAL_TABLET | Freq: Once | ORAL | Status: DC | PRN

## 2019-03-19 MED ORDER — CLINDAMYCIN PHOSPHATE 300 MG/50ML IV SOLN
INTRAVENOUS | Status: AC
  Filled 2019-03-19: qty 50

## 2019-03-19 MED ORDER — METHYLPREDNISOLONE SODIUM SUCC 125 MG IJ SOLR
125.0000 mg | Freq: Once | INTRAMUSCULAR | Status: AC | PRN
  Administered 2019-03-19: 125 mg via INTRAVENOUS

## 2019-03-19 MED ORDER — MIDAZOLAM HCL 2 MG/2ML IJ SOLN
INTRAMUSCULAR | Status: DC | PRN
  Administered 2019-03-19: 2 mg via INTRAVENOUS

## 2019-03-19 MED ORDER — SODIUM CHLORIDE 0.9 % IV SOLN: INTRAVENOUS | Status: DC

## 2019-03-19 MED ORDER — DIPHENHYDRAMINE HCL 50 MG/ML IJ SOLN
INTRAMUSCULAR | Status: AC
  Filled 2019-03-19: qty 1

## 2019-03-19 MED ORDER — IODIXANOL 320 MG/ML IV SOLN
INTRAVENOUS | Status: DC | PRN
  Administered 2019-03-19: 20 mL via INTRAVENOUS

## 2019-03-19 MED ORDER — FENTANYL CITRATE (PF) 100 MCG/2ML IJ SOLN: 12.5000 ug | Freq: Once | INTRAMUSCULAR | Status: DC | PRN

## 2019-03-19 MED ORDER — METHYLPREDNISOLONE SODIUM SUCC 125 MG IJ SOLR
INTRAMUSCULAR | Status: AC
  Filled 2019-03-19: qty 2

## 2019-03-19 MED ORDER — FENTANYL CITRATE (PF) 100 MCG/2ML IJ SOLN
INTRAMUSCULAR | Status: AC
  Filled 2019-03-19: qty 4

## 2019-03-19 MED ORDER — DIPHENHYDRAMINE HCL 50 MG/ML IJ SOLN
50.0000 mg | Freq: Once | INTRAMUSCULAR | Status: AC | PRN
  Administered 2019-03-19: 50 mg via INTRAVENOUS

## 2019-03-19 MED ORDER — CLINDAMYCIN PHOSPHATE 300 MG/50ML IV SOLN
300.0000 mg | Freq: Once | INTRAVENOUS | Status: AC
  Administered 2019-03-19: 300 mg via INTRAVENOUS

## 2019-03-19 MED ORDER — MIDAZOLAM HCL 5 MG/5ML IJ SOLN
INTRAMUSCULAR | Status: AC
  Filled 2019-03-19: qty 5

## 2019-03-19 MED ORDER — ONDANSETRON HCL 4 MG/2ML IJ SOLN: 4.0000 mg | Freq: Four times a day (QID) | INTRAMUSCULAR | Status: DC | PRN

## 2019-03-19 MED ORDER — HEPARIN SODIUM (PORCINE) 1000 UNIT/ML IJ SOLN
INTRAMUSCULAR | Status: DC | PRN
  Administered 2019-03-19: 3000 [IU] via INTRAVENOUS

## 2019-03-19 SURGICAL SUPPLY — 14 items
BALLN DORADO 9X80X80 (BALLOONS) ×2
BALLOON DORADO 9X80X80 (BALLOONS) IMPLANT
CATH BEACON 5 .035 40 KMP TP (CATHETERS) IMPLANT
CATH BEACON 5 .038 40 KMP TP (CATHETERS) ×1
DEVICE PRESTO INFLATION (MISCELLANEOUS) ×1 IMPLANT
NDL ENTRY 21GA 7CM ECHOTIP (NEEDLE) IMPLANT
NEEDLE ENTRY 21GA 7CM ECHOTIP (NEEDLE) ×2 IMPLANT
PACK ANGIOGRAPHY (CUSTOM PROCEDURE TRAY) ×2 IMPLANT
SET INTRO CAPELLA COAXIAL (SET/KITS/TRAYS/PACK) ×1 IMPLANT
SHEATH BRITE TIP 6FRX5.5 (SHEATH) ×1 IMPLANT
SHEATH BRITE TIP 7FRX5.5 (SHEATH) ×1 IMPLANT
STENT COVERA FLARED 9X80X80 (Permanent Stent) ×1 IMPLANT
SUT MNCRL AB 4-0 PS2 18 (SUTURE) ×1 IMPLANT
WIRE MAGIC TORQUE 260C (WIRE) ×1 IMPLANT

## 2019-03-19 NOTE — Discharge Instructions (Signed)
Dialysis Fistulogram, Care After °This sheet gives you information about how to care for yourself after your procedure. Your health care provider may also give you more specific instructions. If you have problems or questions, contact your health care provider. °What can I expect after the procedure? °After the procedure, it is common to have: °· A small amount of discomfort in the area where the small, thin tube (catheter) was placed for the procedure. °· A small amount of bruising around the fistula. °· Sleepiness and tiredness (fatigue). °Follow these instructions at home: °Activity ° °· Rest at home and do not lift anything that is heavier than 5 lb (2.3 kg) on the day after your procedure. °· Return to your normal activities as told by your health care provider. Ask your health care provider what activities are safe for you. °· Do not drive or use heavy machinery while taking prescription pain medicine. °· Do not drive for 24 hours if you were given a medicine to help you relax (sedative) during your procedure. °Medicines ° °· Take over-the-counter and prescription medicines only as told by your health care provider. °Puncture site care °· Follow instructions from your health care provider about how to take care of the site where catheters were inserted. Make sure you: °? Wash your hands with soap and water before you change your bandage (dressing). If soap and water are not available, use hand sanitizer. °? Change your dressing as told by your health care provider. °? Leave stitches (sutures), skin glue, or adhesive strips in place. These skin closures may need to stay in place for 2 weeks or longer. If adhesive strip edges start to loosen and curl up, you may trim the loose edges. Do not remove adhesive strips completely unless your health care provider tells you to do that. °· Check your puncture area every day for signs of infection. Check for: °? Redness, swelling, or pain. °? Fluid or  blood. °? Warmth. °? Pus or a bad smell. °General instructions °· Do not take baths, swim, or use a hot tub until your health care provider approves. Ask your health care provider if you may take showers. You may only be allowed to take sponge baths. °· Monitor your dialysis fistula closely. Check to make sure that you can feel a vibration or buzz (a thrill) when you put your fingers over the fistula. °· Prevent damage to your graft or fistula: °? Do not wear tight-fitting clothing or jewelry on the arm or leg that has your graft or fistula. °? Tell all your health care providers that you have a dialysis fistula or graft. °? Do not allow blood draws, IVs, or blood pressure readings to be done in the arm that has your fistula or graft. °? Do not allow flu shots or vaccinations in the arm with your fistula or graft. °· Keep all follow-up visits as told by your health care provider. This is important. °Contact a health care provider if: °· You have redness, swelling, or pain at the site where the catheter was put in. °· You have fluid or blood coming from the catheter site. °· The catheter site feels warm to the touch. °· You have pus or a bad smell coming from the catheter site. °· You have a fever or chills. °Get help right away if: °· You feel weak. °· You have trouble balancing. °· You have trouble moving your arms or legs. °· You have problems with your speech or vision. °· You   can no longer feel a vibration or buzz when you put your fingers over your dialysis fistula. °· The limb that was used for the procedure: °? Swells. °? Is painful. °? Is cold. °? Is discolored, such as blue or pale white. °· You have chest pain or shortness of breath. °Summary °· After a dialysis fistulogram, it is common to have a small amount of discomfort or bruising in the area where the small, thin tube (catheter) was placed. °· Rest at home on the day after your procedure. Return to your normal activities as told by your health care  provider. °· Take over-the-counter and prescription medicines only as told by your health care provider. °· Follow instructions from your health care provider about how to take care of the site where the catheter was inserted. °· Keep all follow-up visits as told by your health care provider. °This information is not intended to replace advice given to you by your health care provider. Make sure you discuss any questions you have with your health care provider. °Document Released: 11/11/2013 Document Revised: 07/28/2017 Document Reviewed: 07/28/2017 °Elsevier Patient Education © 2020 [employer] ° °

## 2019-03-19 NOTE — H&P (Signed)
[facility] History & Physical Update  The patient was interviewed and re-examined.  The patient's previous History and Physical has been reviewed and is unchanged.  There is no change in the plan of care. We plan to proceed with the scheduled procedure.  Hortencia Pilar, MD  03/19/2019, 12:04 PM

## 2019-03-19 NOTE — Op Note (Signed)
OPERATIVE NOTE   PROCEDURE: 1. Contrast injection left arm hybrid AV access 2. Percutaneous transluminal angioplasty and stent placement venous outflow left upper arm AV access  PRE-OPERATIVE DIAGNOSIS: Complication of dialysis access                                                       End Stage Renal Disease  POST-OPERATIVE DIAGNOSIS: same as above   SURGEON: Katha Cabal, M.D.  ANESTHESIA: Conscious sedation was administered under my direct supervision by the interventional radiology RN. IV Versed plus fentanyl were utilized. Continuous ECG, pulse oximetry and blood pressure was monitored throughout the entire procedure.  Conscious sedation was for a total of 35 minutes.  ESTIMATED BLOOD LOSS: minimal  FINDING(S): 1. Greater than 80% stenosis at the venous outflow to the axillary vein.  SPECIMEN(S):  None  CONTRAST: 20 cc  FLUOROSCOPY TIME: 1.9 minutes  INDICATIONS: Shawna [name] is a 78 y.o. female who  presents with malfunctioning left upper arm AV access.  The patient is scheduled for angiography with possible intervention of the AV access.  The patient is aware the risks include but are not limited to: bleeding, infection, thrombosis of the cannulated access, and possible anaphylactic reaction to the contrast.  The patient acknowledges if the access can not be salvaged a tunneled catheter will be needed and will be placed during this procedure.  The patient is aware of the risks of the procedure and elects to proceed with the angiogram and intervention.  DESCRIPTION: After full informed written consent was obtained, the patient was brought back to the Special Procedure suite and placed supine position.  Appropriate cardiopulmonary monitors were placed.  The left arm was prepped and draped in the standard fashion.  Appropriate timeout is called. The left upper arm access was cannulated with a micropuncture needle.  Cannulation was performed with ultrasound guidance.  Ultrasound was placed in a sterile sleeve, the AV access was interrogated and noted to be echolucent and compressible indicating patency. Image was recorded for the permanent record. The puncture is performed under continuous ultrasound visualization.   The microwire was advanced and the needle was exchanged for  a microsheath.  The J-wire was then advanced and a 6 Fr sheath inserted.  Hand injections were completed to image the access from the arterial anastomosis through the entire access.  The central venous structures were also imaged by hand injections.  Diagnostic interpretation: The patient's upper extremity access appears to be a hybrid the proximal segment is a fistula there is been an interposition PTFE graft placed with anastomosis at the venous and to the axillary vein.  There is a greater than 80% stenosis at the level of this anastomosis.  The more proximal axillary vein subclavian vein innominate and superior vena cava appear widely patent.  Based on the images,  3000 units of heparin was given and a wire was negotiated through the strictures within the venous portion of the graft.  An 9 mm x 80 mm flared Covera stent was deployed across the stenoses and postdilated with an 9 mm Dorado balloon.  Inflation was to 20 atm for approximately 1 minute  Follow-up imaging demonstrates complete resolution of the stricture there is less than 10% residual stenosis with rapid flow of contrast through the graft, the central venous anatomy is preserved.  A 4-0 Monocryl purse-string suture was sewn around the sheath.  The sheath was removed and light pressure was applied.  A sterile bandage was applied to the puncture site.    COMPLICATIONS: None  CONDITION: Shawna [name], M.D [facility] Office: [phone]  03/19/2019 2:48 PM

## 2019-03-20 ENCOUNTER — Encounter: Payer: Self-pay | Admitting: Vascular Surgery

## 2019-03-21 ENCOUNTER — Other Ambulatory Visit (INDEPENDENT_AMBULATORY_CARE_PROVIDER_SITE_OTHER): Payer: Self-pay | Admitting: Nurse Practitioner

## 2019-03-21 ENCOUNTER — Telehealth (INDEPENDENT_AMBULATORY_CARE_PROVIDER_SITE_OTHER): Payer: Self-pay | Admitting: Internal Medicine

## 2019-03-21 DIAGNOSIS — K921 Melena: Secondary | ICD-10-CM

## 2019-03-21 MED ORDER — NYSTATIN-TRIAMCINOLONE 100000-0.1 UNIT/GM-% EX OINT: 1.0000 "application " | TOPICAL_OINTMENT | Freq: Two times a day (BID) | CUTANEOUS | 0 refills | Status: AC

## 2019-03-21 NOTE — Telephone Encounter (Signed)
Patient left message stating she is having some rectal itching - please call [phone]

## 2019-03-21 NOTE — Telephone Encounter (Signed)
I called pt, she complains of anal itchiness with a red rash to this area. I prescribed Nystatin with triamcinolone cream bid x 7 days. She feels fatigued but no black stools, passing brown formed stools. She needs CBC rechecked, lab order entered, she stated she will get done with her next dialysis. She needs ov next week. I will have Mitzie call pt to schedule appt after blood tests done.

## 2019-03-21 NOTE — Telephone Encounter (Signed)
Send this message to Carl Best , NP to review and make recommendation .

## 2019-03-22 LAB — CBC
HCT: 35.8 % (ref 35.0–45.0)
Hemoglobin: 11.3 g/dL — ABNORMAL LOW (ref 11.7–15.5)
MCH: 32.6 pg (ref 27.0–33.0)
MCHC: 31.6 g/dL — ABNORMAL LOW (ref 32.0–36.0)
MCV: 103.2 fL — ABNORMAL HIGH (ref 80.0–100.0)
MPV: 13.3 fL — ABNORMAL HIGH (ref 7.5–12.5)
Platelets: 281 10*3/uL (ref 140–400)
RBC: 3.47 10*6/uL — ABNORMAL LOW (ref 3.80–5.10)
RDW: 17 % — ABNORMAL HIGH (ref 11.0–15.0)
WBC: 8.5 10*3/uL (ref 3.8–10.8)

## 2019-03-25 NOTE — Telephone Encounter (Signed)
Patient called stated she had a colonoscopy and is still having to run to the bathroom - would like to talk to Dr Laural Golden - ph# [phone]

## 2019-03-27 NOTE — Telephone Encounter (Signed)
Patient has been given OV for 04/18/19 with Carl Best NP.

## 2019-04-02 ENCOUNTER — Other Ambulatory Visit: Payer: Self-pay

## 2019-04-02 ENCOUNTER — Encounter (INDEPENDENT_AMBULATORY_CARE_PROVIDER_SITE_OTHER): Payer: Self-pay | Admitting: Nurse Practitioner

## 2019-04-02 ENCOUNTER — Ambulatory Visit (INDEPENDENT_AMBULATORY_CARE_PROVIDER_SITE_OTHER): Payer: Medicare HMO | Admitting: Nurse Practitioner

## 2019-04-02 VITALS — BP 155/66 | HR 69 | Temp 98.3°F | Ht 62.0 in | Wt 124.4 lb

## 2019-04-02 DIAGNOSIS — D509 Iron deficiency anemia, unspecified: Secondary | ICD-10-CM | POA: Insufficient documentation

## 2019-04-02 DIAGNOSIS — D508 Other iron deficiency anemias: Secondary | ICD-10-CM | POA: Diagnosis not present

## 2019-04-02 DIAGNOSIS — K219 Gastro-esophageal reflux disease without esophagitis: Secondary | ICD-10-CM | POA: Diagnosis not present

## 2019-04-02 NOTE — Progress Notes (Addendum)
Subjective:    Patient ID: Shawna [name], female    DOB: Oct 29, 1940, 78 y.o.   MRN: [id number]  HPI  Shawna [name] is a 78 y.o. with a past medical history significant for coronary artery disease, status post CABG in 2013, and NSTEMI 2018, coronary stent placement 3500, diastolic CHF, mitral regurgitation, TIA,  anxiety, GI bleed secondary to duodenal and jejunal AVM, colon cancer 1992, ovarian cancer 1992, end-stage renal disease on dialysis every M-W-F. She is on Brilinta 60 mg twice daily and aspirin 81 mg daily. Patient was last seen the office 03/05/2019 for follow-up after she presented to [facility] with melena.  Her hemoglobin was 10 and she did not fit the criteria for admission so she was discharged home.  A small bowel capsule endoscopy was ordered and completed on 03/07/2019 which identified 2 small jejunal AV malformations without stigmata of bleeding, a single small jejunal diverticulum without stigmata of bleeding was identified. She continued to pass darker stool with blood per the rectum with abdominal pain. She presented back to any [facility] emergency room on 03/07/2019.  Her hemoglobin dropped to 7.3.  She was assessed to be in pulmonary edema and she was admitted to the ICU.  She received 2 units of PRBCs and received dialysis during this admission.  She underwent a diagnostic colonoscopy 03/09/2019 which identified diverticulosis in the entire colon, patent end-to-end ileocolonic anastomosis, one 30 mm lobulated tubular adenomatous polyp at the proximal transverse colon and 2 small tubular adenomatous polyps in the proximal ascending colon were removed. No evidence of active GI bleeding.  A repeat colonoscopy in 3 years was recommended. She was discharged home on 03/10/2019.  Since she been home, she denies has seen any further black stools or red blood from the rectum.  Two nights ago, she passed a very long formed stool followed by 5-6 watery brown bowel movements.  She took 2  Imodium tablets on Monday morning so she could complete her dialysis without concern of having diarrhea.  No bowel movement yet today.  She had some anal itchiness which resolved after nystatin cream was prescribed.  She underwent a surgery to the left AV graft on 03/13/8181 without complications.  She was dialyzed yesterday.  Her H&H has remained stable.  Labs on 03/22/2019 showed a hemoglobin 11.3 and hematocrit 35.8.  CBC Latest Ref Rng & Units 03/22/2019 03/10/2019 03/09/2019  WBC 3.8 - 10.8 Thousand/uL 8.5 6.9 6.3  Hemoglobin 11.7 - 15.5 g/dL 11.3(L) 10.1(L) 12.0  Hematocrit 35.0 - 45.0 % 35.8 32.2(L) 38.1  Platelets 140 - 400 Thousand/uL 281 192 175    Review of Systems the HPI, all other systems reviewed and are negative     Objective:   Physical Exam  BP (!) 155/66    Pulse 69    Temp 98.3 F (36.8 C)    Ht 5\' 2"  (1.575 m)    Wt 124 lb 6.4 oz (56.4 kg)    BMI 22.75 kg/m   General: 78 year old female well-developed in no acute distress Eyes: Sclera nonicteric, conjunctiva pink Heart: Regular rate and rhythm, colic murmur Lungs: Breath sounds clear throughout Abdomen: Soft, nontender, small vertical scar left of the umbilicus, positive bowel sounds all 4 quadrants, no HSM Rectal: No perianal rash, a few excoriations most likely from scratching noted Extremities: No edema Neuro: Alert and oriented x4, no focal deficits     Assessment & Plan:   29. 57.  78 year old female history of upper GI  bleed presumed to be due to duodenal and jejunal AVMs presents today for follow-up.  Her H&H has remained stable.  No further melena or red blood per the rectum. -Repeat CBC, iron panel -Phillips bacteria probiotic -Continue nystatin cream twice daily to the perianal area as needed -Follow-up in the office in 3 months  2. Significant coronary artery disease as reported in HPI on Brilinta and aspirin  3.  Chronic anemia secondary to end-stage renal disease and intermittent bleeding from small  bowel AVMs  4.  End-stage renal disease on hemodialysis M-W-F  5.  Past history of colon cancer s/p colon resection 1992 -Next colonoscopy due August 2023  6.  Past history of ovarian cancer

## 2019-04-02 NOTE — Patient Instructions (Signed)
1. Complete the ordered blood tests at the time or your next dialysis   2. Phillip's bacteria probiotic once daily   3. Call our office if your diarrhea returns   4. Follow up in the office in 3 months

## 2019-04-09 LAB — CBC WITH DIFFERENTIAL/PLATELET
Absolute Monocytes: 481 cells/uL (ref 200–950)
Basophils Absolute: 49 cells/uL (ref 0–200)
Basophils Relative: 0.9 %
Eosinophils Absolute: 324 cells/uL (ref 15–500)
Eosinophils Relative: 6 %
HCT: 34.1 % — ABNORMAL LOW (ref 35.0–45.0)
Hemoglobin: 10.7 g/dL — ABNORMAL LOW (ref 11.7–15.5)
Lymphs Abs: 745 cells/uL — ABNORMAL LOW (ref 850–3900)
MCH: 32 pg (ref 27.0–33.0)
MCHC: 31.4 g/dL — ABNORMAL LOW (ref 32.0–36.0)
MCV: 102.1 fL — ABNORMAL HIGH (ref 80.0–100.0)
MPV: 14 fL — ABNORMAL HIGH (ref 7.5–12.5)
Monocytes Relative: 8.9 %
Neutro Abs: 3802 cells/uL (ref 1500–7800)
Neutrophils Relative %: 70.4 %
Platelets: 189 10*3/uL (ref 140–400)
RBC: 3.34 10*6/uL — ABNORMAL LOW (ref 3.80–5.10)
RDW: 17.1 % — ABNORMAL HIGH (ref 11.0–15.0)
Total Lymphocyte: 13.8 %
WBC: 5.4 10*3/uL (ref 3.8–10.8)

## 2019-04-09 LAB — IRON,TIBC AND FERRITIN PANEL
%SAT: 46 % (calc) — ABNORMAL HIGH (ref 16–45)
Ferritin: 767 ng/mL — ABNORMAL HIGH (ref 16–288)
Iron: 88 ug/dL (ref 45–160)
TIBC: 193 mcg/dL (calc) — ABNORMAL LOW (ref 250–450)

## 2019-04-10 ENCOUNTER — Other Ambulatory Visit (INDEPENDENT_AMBULATORY_CARE_PROVIDER_SITE_OTHER): Payer: Self-pay | Admitting: Vascular Surgery

## 2019-04-10 DIAGNOSIS — Z95820 Peripheral vascular angioplasty status with implants and grafts: Secondary | ICD-10-CM

## 2019-04-11 ENCOUNTER — Other Ambulatory Visit: Payer: Self-pay

## 2019-04-11 ENCOUNTER — Ambulatory Visit (INDEPENDENT_AMBULATORY_CARE_PROVIDER_SITE_OTHER): Payer: Medicare HMO | Admitting: Vascular Surgery

## 2019-04-11 ENCOUNTER — Encounter (INDEPENDENT_AMBULATORY_CARE_PROVIDER_SITE_OTHER): Payer: Self-pay | Admitting: Vascular Surgery

## 2019-04-11 ENCOUNTER — Ambulatory Visit (INDEPENDENT_AMBULATORY_CARE_PROVIDER_SITE_OTHER): Payer: Medicare HMO

## 2019-04-11 VITALS — BP 187/68 | HR 67 | Resp 16 | Ht 61.0 in | Wt 124.0 lb

## 2019-04-11 DIAGNOSIS — I2510 Atherosclerotic heart disease of native coronary artery without angina pectoris: Secondary | ICD-10-CM

## 2019-04-11 DIAGNOSIS — T829XXS Unspecified complication of cardiac and vascular prosthetic device, implant and graft, sequela: Secondary | ICD-10-CM

## 2019-04-11 DIAGNOSIS — I10 Essential (primary) hypertension: Secondary | ICD-10-CM | POA: Diagnosis not present

## 2019-04-11 DIAGNOSIS — I6523 Occlusion and stenosis of bilateral carotid arteries: Secondary | ICD-10-CM

## 2019-04-11 DIAGNOSIS — Z95820 Peripheral vascular angioplasty status with implants and grafts: Secondary | ICD-10-CM

## 2019-04-11 DIAGNOSIS — N186 End stage renal disease: Secondary | ICD-10-CM

## 2019-04-11 NOTE — Progress Notes (Signed)
MRN : [id number]  Shawna [name] is a 78 y.o. ([birth year]) female who presents with chief complaint of  Chief Complaint  Patient presents with  . Follow-up    ultrasound  .  History of Present Illness:   The patient returns to the office for followup of their dialysis access. The function of the access has been stable. The patient denies increased bleeding time or increased recirculation. Patient denies difficulty with cannulation. The patient denies hand pain or other symptoms consistent with steal phenomena.  No significant arm swelling.  The patient denies redness or swelling at the access site. The patient denies fever or chills at home or while on dialysis.  The patient denies amaurosis fugax or recent TIA symptoms. There are no recent neurological changes noted. The patient denies claudication symptoms or rest pain symptoms. The patient denies history of DVT, PE or superficial thrombophlebitis. The patient denies recent episodes of angina or shortness of breath.   Duplex ultrasound of the AV access shows a patent access.  The previously noted stenosis is unchanged compared to last study.      Current Meds  Medication Sig  . ALPRAZolam (XANAX) 0.5 MG tablet Take 0.5 mg by mouth daily.  Marland Kitchen amiodarone (PACERONE) 200 MG tablet Take 1 tablet (200 mg total) by mouth daily.  Marland Kitchen aspirin EC 81 MG tablet Take 1 tablet (81 mg total) by mouth daily. Restart on 03/13/19  . benzonatate (TESSALON) 100 MG capsule TAKE 1 CAPSULE BY MOUTH THREE TIMES DAILY AS NEEDED FOR COUGH  . carvedilol (COREG) 12.5 MG tablet Take 1 tablet (12.5 mg total) by mouth 2 (two) times daily with a meal. Additional refills per PCP  . cloNIDine (CATAPRES) 0.1 MG tablet Take 1 tablet (0.1 mg total) by mouth 2 (two) times daily.  . diphenhydrAMINE (BENADRYL) 25 mg capsule Take 50 mg by mouth daily as needed for itching or allergies.   . fluticasone (FLONASE) 50 MCG/ACT nasal spray Place 1 spray at bedtime as needed into  both nostrils for allergies.   . isosorbide mononitrate (IMDUR) 120 MG 24 hr tablet Take 120 mg by mouth daily.  Marland Kitchen lidocaine-prilocaine (EMLA) cream Apply 1 application topically every Monday, Wednesday, and Friday. Prior to dialysis  . loratadine (CLARITIN) 10 MG tablet Take 1 tablet (10 mg total) by mouth daily as needed for allergies.  . multivitamin (RENA-VIT) TABS tablet Take 1 tablet by mouth daily.  . nitroGLYCERIN (NITROSTAT) 0.4 MG SL tablet DISSOLVE ONE TABLET UNDER THE TONGUE EVERY 5 MINUTES AS NEEDED FOR CHEST PAIN.  DO NOT EXCEED A TOTAL OF 3 DOSES IN 15 MINUTES  . nystatin ointment (MYCOSTATIN) APPLY A SMALL AMOUNT TOPICALLY TWICE DAILY FOR 7 DAYS TO EXTERNAL ANAL AREA FOR ANAL ITCHINESS  . omeprazole (PRILOSEC) 20 MG capsule Take 1 capsule by mouth once daily  . ondansetron (ZOFRAN-ODT) 4 MG disintegrating tablet DISSOLVE 1 TABLET IN MOUTH TWICE DAILY AS NEEDED  . sevelamer carbonate (RENVELA) 800 MG tablet TAKE 3 TABLETS BY MOUTH THREE TIMES DAILY WITH MEALS AND 2 TABLETS WITH SNACKS  . simvastatin (ZOCOR) 20 MG tablet TAKE ONE TABLET (20 MG) BY MOUTH DAILY AT BEDTIME  . ticagrelor (BRILINTA) 60 MG TABS tablet Take 1 tablet (60 mg total) by mouth 2 (two) times daily. Restart on 03/13/19    Past Medical History:  Diagnosis Date  . Acute on chronic respiratory failure with hypoxia ([place]) 10/10/2016  . Anxiety   . Arthritis   . AVM (arteriovenous malformation)  of colon   . CAD (coronary artery disease)    a. s/p CABG in 2013 b. most recent DES to D1 in 10/2016 with cath showing patent LIMA-LAD, SVG-RI, SVG-Mrg and SVG-RCA  c. NST in 02/2018 showing scar with no significant ischemia  . Carotid artery disease ([place])    a. 35-32% LICA, 03/9241   . Chronic bronchitis ([place])   . Chronic diastolic CHF (congestive heart failure) ([place])    a. 02/2012 Echo EF 60-65%, nl wall motion, Gr 1 DD, mod MR  . Colon cancer ([place]) 1992  . Esophageal stricture   . ESRD on hemodialysis ([place])    ESRD due  to HTN, started dialysis 2011 and gets HD at [facility] with Dr Hinda Lenis on MWF schedule.  Access is LUA AVF as of Sept 2014.   Marland Kitchen GERD (gastroesophageal reflux disease)   . High cholesterol 12/2011  . History of blood transfusion 07/2011; 12/2011; 01/2012 X 2; 04/2012  . History of gout   . History of lower GI bleeding   . Hypertension   . Iron deficiency anemia   . Mitral regurgitation    a. Moderate by echo, 02/2012  . Myocardial infarction ([place])   . Ovarian cancer ([place]) 1992  . Pneumonia ~ 2009  . PUD (peptic ulcer disease)   . TIA (transient ischemic attack)     Past Surgical History:  Procedure Laterality Date  . A/V SHUNTOGRAM Left 03/19/2019   Procedure: A/V SHUNTOGRAM;  Surgeon: Katha Cabal, MD;  Location: [place] [facility];  Service: Cardiovascular;  Laterality: Left;  . ABDOMINAL HYSTERECTOMY  1992  . APPENDECTOMY  06/1990  . AV FISTULA PLACEMENT  07/2009   left upper arm  . AV FISTULA PLACEMENT Right 09/06/2016   Procedure: RIGHT FOREARM ARTERIOVENOUS (AV) GRAFT;  Surgeon: Elam Dutch, MD;  Location: [facility] OR;  Service: Vascular;  Laterality: Right;  . AV FISTULA PLACEMENT N/A 02/24/2017   Procedure: INSERTION OF ARTERIOVENOUS (AV) GORE-TEX GRAFT ARM (BRACHIAL AXILLARY);  Surgeon: Katha Cabal, MD;  Location: [facility] ORS;  Service: Vascular;  Laterality: N/A;  . [place] Right 09/06/2016   Procedure: REMOVAL OF Right Arm ARTERIOVENOUS GORETEX GRAFT and Vein Patch angioplasty of brachial artery;  Surgeon: Angelia Mould, MD;  Location: [place];  Service: Vascular;  Laterality: Right;  . COLON RESECTION  1992  . COLON SURGERY    . COLONOSCOPY N/A 03/09/2019   Procedure: COLONOSCOPY;  Surgeon: Rogene Houston, MD;  [facility];  Service: Endoscopy;  Laterality: N/A;  . CORONARY ANGIOPLASTY WITH STENT PLACEMENT  12/15/11   "2"  . CORONARY ANGIOPLASTY WITH STENT PLACEMENT  y/2013   "1; makes total of 3" (05/02/2012)  . CORONARY ARTERY BYPASS GRAFT   06/13/2012   Procedure: CORONARY ARTERY BYPASS GRAFTING (CABG);  Surgeon: Grace Isaac, MD;  Location: [place];  Service: Open Heart Surgery;  Laterality: N/A;  cabg x four;  using left internal mammary artery, and left leg greater saphenous vein harvested endoscopically  . CORONARY STENT INTERVENTION N/A 10/13/2016   Procedure: Coronary Stent Intervention;  Surgeon: Troy Sine, MD;  Location: [place] [facility] LAB;  Service: Cardiovascular;  Laterality: N/A;  . DIALYSIS/PERMA CATHETER REMOVAL N/A 04/18/2017   Procedure: DIALYSIS/PERMA CATHETER REMOVAL;  Surgeon: Katha Cabal, MD;  Location: [place] [facility];  Service: Cardiovascular;  Laterality: N/A;  . DILATION AND CURETTAGE OF UTERUS    . ESOPHAGOGASTRODUODENOSCOPY  01/20/2012   Procedure: ESOPHAGOGASTRODUODENOSCOPY (EGD);  Surgeon: Ladene Artist, MD,FACG;  Location: [facility] ENDOSCOPY;  Service: Endoscopy;  Laterality: N/A;  . ESOPHAGOGASTRODUODENOSCOPY N/A 03/26/2013   Procedure: ESOPHAGOGASTRODUODENOSCOPY (EGD);  Surgeon: Irene Shipper, MD;  Location: [facility] ENDOSCOPY;  Service: Endoscopy;  Laterality: N/A;  . ESOPHAGOGASTRODUODENOSCOPY N/A 04/30/2015   Procedure: ESOPHAGOGASTRODUODENOSCOPY (EGD);  Surgeon: Rogene Houston, MD;  [facility];  Service: Endoscopy;  Laterality: N/A;  1pm - moved to 10/20 @ 1:10  . ESOPHAGOGASTRODUODENOSCOPY N/A 07/29/2016   Procedure: ESOPHAGOGASTRODUODENOSCOPY (EGD);  Surgeon: Manus Gunning, MD;  Location: [place];  Service: Gastroenterology;  Laterality: N/A;  enteroscopy  . GIVENS CAPSULE STUDY N/A 03/07/2019   Procedure: GIVENS CAPSULE STUDY;  Surgeon: Rogene Houston, MD;  [facility];  Service: Endoscopy;  Laterality: N/A;  7:30  . INTRAOPERATIVE TRANSESOPHAGEAL ECHOCARDIOGRAM  06/13/2012   Procedure: INTRAOPERATIVE TRANSESOPHAGEAL ECHOCARDIOGRAM;  Surgeon: Grace Isaac, MD;  Location: [place];  Service: Open Heart Surgery;  Laterality: N/A;  . IR GENERIC HISTORICAL   07/26/2016   IR FLUORO GUIDE CV LINE RIGHT 07/26/2016 Greggory Keen, MD [facility]  . IR GENERIC HISTORICAL  07/26/2016   IR US GUIDE VASC ACCESS RIGHT 07/26/2016 Greggory Keen, MD [facility]  . IR GENERIC HISTORICAL  08/02/2016   IR US GUIDE VASC ACCESS RIGHT 08/02/2016 Greggory Keen, MD [facility]  . IR GENERIC HISTORICAL  08/02/2016   IR FLUORO GUIDE CV LINE RIGHT 08/02/2016 Greggory Keen, MD [facility]  . IR RADIOLOGY PERIPHERAL GUIDED IV START  03/28/2017  . IR US GUIDE VASC ACCESS RIGHT  03/28/2017  . LEFT HEART CATH AND CORONARY ANGIOGRAPHY N/A 09/20/2016   Procedure: Left Heart Cath and Coronary Angiography;  Surgeon: [name], MD;  Location: [place] [facility];  Service: Cardiovascular;  Laterality: N/A;  . LEFT HEART CATH AND CORS/GRAFTS ANGIOGRAPHY N/A 10/13/2016   Procedure: Left Heart Cath and Cors/Grafts Angiography;  Surgeon: Troy Sine, MD;  Location: [place] [facility];  Service: Cardiovascular;  Laterality: N/A;  . LEFT HEART CATH AND CORS/GRAFTS ANGIOGRAPHY N/A 07/13/2018   Procedure: LEFT HEART CATH AND CORS/GRAFTS ANGIOGRAPHY;  Surgeon: Martinique, Peter M, MD;  Location: [place] [facility];  Service: Cardiovascular;  Laterality: N/A;  . LEFT HEART CATHETERIZATION WITH CORONARY ANGIOGRAM N/A 12/15/2011   Procedure: LEFT HEART CATHETERIZATION WITH CORONARY ANGIOGRAM;  Surgeon: Burnell Blanks, MD;  Location: [facility] CATH LAB;  Service: Cardiovascular;  Laterality: N/A;  . LEFT HEART CATHETERIZATION WITH CORONARY ANGIOGRAM N/A 01/10/2012   Procedure: LEFT HEART CATHETERIZATION WITH CORONARY ANGIOGRAM;  Surgeon: Peter M Martinique, MD;  Location: [facility] CATH LAB;  Service: Cardiovascular;  Laterality: N/A;  . LEFT HEART CATHETERIZATION WITH CORONARY ANGIOGRAM N/A 06/08/2012   Procedure: LEFT HEART CATHETERIZATION WITH CORONARY ANGIOGRAM;  Surgeon: Burn[place] Blanks, MD;  Location: [facility] CATH LAB;  Service: Cardiovascular;  Laterality: N/A;  . LEFT HEART CATHETERIZATION WITH  CORONARY/GRAFT ANGIOGRAM N/A 12/10/2013   Procedure: LEFT HEART CATHETERIZATION WITH Beatrix Fetters;  Surgeon: [name], MD;  Location: [facility] CATH LAB;  Service: Cardiovascular;  Laterality: N/A;  . OVARY SURGERY     ovarian cancer  . POLYPECTOMY  03/09/2019   Procedure: POLYPECTOMY;  Surgeon: Rogene Houston, MD;  [facility];  Service: Endoscopy;;  cecal   . REVISION OF ARTERIOVENOUS GORETEX GRAFT N/A 02/24/2017   Procedure: REVISION OF ARTERIOVENOUS GORETEX GRAFT (RESECTION);  Surgeon: Katha Cabal, MD;  Location: [facility] ORS;  Service: Vascular;  Laterality: N/A;  .  SHUNTOGRAM N/A 10/15/2013   Procedure: Fistulogram;  Surgeon: Serafina Mitchell, MD;  Location: [facility] CATH LAB;  Service: Cardiovascular;  Laterality: N/A;  . THROMBECTOMY / ARTERIOVENOUS GRAFT REVISION  2011   left upper arm  . TUBAL LIGATION  1980's  . UPPER EXTREMITY ANGIOGRAPHY Bilateral 12/06/2016   Procedure: Upper Extremity Angiography;  Surgeon: Katha Cabal, MD;  Location: [place] [facility];  Service: Cardiovascular;  Laterality: Bilateral;  . UPPER EXTREMITY INTERVENTION Left 06/06/2017   Procedure: UPPER EXTREMITY INTERVENTION;  Surgeon: Katha Cabal, MD;  Location: [place] [facility] LAB;  Service: Cardiovascular;  Laterality: Left;    Social History Social History   Tobacco Use  . Smoking status: Never Smoker  . Smokeless tobacco: Never Used  Substance Use Topics  . Alcohol use: No    Alcohol/week: 0.0 standard drinks  . Drug use: No    Family History Family History  Problem Relation Age of Onset  . Heart disease Mother        Heart Disease before age 2  . Hyperlipidemia Mother   . Hypertension Mother   . Diabetes Mother   . Heart attack Mother   . Heart disease Father        Heart Disease before age 62  . Hyperlipidemia Father   . Hypertension Father   . Diabetes Father   . Diabetes Sister   . Hypertension Sister   . Diabetes Brother   . Hyperlipidemia  Brother   . Heart attack Brother   . Hypertension Sister   . Heart attack Brother   . Other Other        noncontributory for early CAD  . Colon cancer Neg Hx   . Esophageal cancer Neg Hx   . Liver disease Neg Hx   . Kidney disease Neg Hx   . Colon polyps Neg Hx     Allergies  Allergen Reactions  . Aspirin Other (See Comments)    High Doses Mess up her stomach; "makes my bowels have blood in them". Takes 81 mg EC Aspirin   . Penicillins Other (See Comments)    SYNCOPE? , "makes me real weak when I take it; like I'll pass out"  Has patient had a PCN reaction causing immediate rash, facial/tongue/throat swelling, SOB or lightheadedness with hypotension: Yes Has patient had a PCN reaction causing severe rash involving mucus membranes or skin necrosis: no Has patient had a PCN reaction that required hospitalization no Has patient had a PCN reaction occurring within the last 10 years: no If all of the above  . Amlodipine Swelling  . Bactrim [Sulfamethoxazole-Trimethoprim] Rash  . Contrast Media [Iodinated Diagnostic Agents] Itching  . Iron Itching and Other (See Comments)    "they gave me iron in dialysis; had to give me Benadryl cause I had to have the iron" (05/02/2012)  . Nitrofurantoin Hives  . Tylenol [Acetaminophen] Itching and Other (See Comments)    Makes her feet on fire per pt  . Gabapentin Other (See Comments)    Unknown reaction  . Hydralazine Itching  . Dexilant [Dexlansoprazole] Other (See Comments)    Upset stomach  . Levaquin [Levofloxacin In D5w] Rash  . Morphine And Related Itching and Other (See Comments)    Itching in feet  . Plavix [Clopidogrel Bisulfate] Rash  . Protonix [Pantoprazole Sodium] Rash  . Venofer [Ferric Oxide] Itching and Other (See Comments)    Patient reports using Benadryl prior to doses as [facility]  REVIEW OF SYSTEMS (Negative unless checked)  Constitutional: [] Weight loss  [] Fever  [] Chills Cardiac: [] Chest pain   [] Chest  pressure   [] Palpitations   [] Shortness of breath when laying flat   [] Shortness of breath with exertion. Vascular:  [] Pain in legs with walking   [] Pain in legs at rest  [] History of DVT   [] Phlebitis   [] Swelling in legs   [] Varicose veins   [] Non-healing ulcers Pulmonary:   [] Uses home oxygen   [] Productive cough   [] Hemoptysis   [] Wheeze  [] COPD   [] Asthma Neurologic:  [] Dizziness   [] Seizures   [] History of stroke   [] History of TIA  [] Aphasia   [] Vissual changes   [] Weakness or numbness in arm   [] Weakness or numbness in leg Musculoskeletal:   [] Joint swelling   [] Joint pain   [] Low back pain Hematologic:  [] Easy bruising  [] Easy bleeding   [] Hypercoagulable state   [] Anemic Gastrointestinal:  [] Diarrhea   [] Vomiting  [] Gastroesophageal reflux/heartburn   [] Difficulty swallowing. Genitourinary:  [x] Chronic kidney disease   [] Difficult urination  [] Frequent urination   [] Blood in urine Skin:  [] Rashes   [] Ulcers  Psychological:  [] History of anxiety   []  History of major depression.  Physical Examination  Vitals:   04/11/19 1420  BP: (!) 187/68  Pulse: 67  Resp: 16  Weight: 124 lb (56.2 kg)  Height: 5\' 1"  (1.549 m)   Body mass index is 23.43 kg/m. Gen: WD/WN, NAD Head: [place]/AT, No temporalis wasting.  Ear/Nose/Throat: Hearing grossly intact, nares w/o erythema or drainage Eyes: PER, EOMI, sclera nonicteric.  Neck: Supple, no large masses.   Pulmonary:  Good air movement, no audible wheezing bilaterally, no use of accessory muscles.  Cardiac: RRR, no JVD Vascular:  Left arm AV access good thrill good bruit Vessel Right Left  Radial Palpable Palpable  Brachial Palpable Palpable  Gastrointestinal: Non-distended. No guarding/no peritoneal signs.  Musculoskeletal: M/S 5/5 throughout.  No deformity or atrophy.  Neurologic: CN 2-12 intact. Symmetrical.  Speech is fluent. Motor exam as listed above. Psychiatric: Judgment intact, Mood & affect appropriate for pt's clinical situation.  Dermatologic: No rashes or ulcers noted.  No changes consistent with cellulitis. Lymph : No lichenification or skin changes of chronic lymphedema.  CBC Lab Results  Component Value Date   WBC 5.4 04/08/2019   HGB 10.7 (L) 04/08/2019   HCT 34.1 (L) 04/08/2019   MCV 102.1 (H) 04/08/2019   PLT 189 04/08/2019    BMET    Component Value Date/Time   NA 139 03/09/2019 0444   K 3.6 03/09/2019 0444   CL 96 (L) 03/09/2019 0444   CO2 31 03/09/2019 0444   GLUCOSE 97 03/09/2019 0444   BUN 19 03/09/2019 0444   CREATININE 5.28 (H) 03/09/2019 0444   CREATININE 6.57 (H) 03/05/2019 1159   CALCIUM 9.5 03/09/2019 0444   CALCIUM 8.1 (L) 05/29/2013 1814   GFRNONAA 7 (L) 03/09/2019 0444   GFRNONAA 6 (L) 03/05/2019 1159   GFRAA 8 (L) 03/09/2019 0444   GFRAA 6 (L) 03/05/2019 1159   CrCl cannot be calculated (Patient's most recent lab result is older than the maximum 21 days allowed.).  COAG Lab Results  Component Value Date   INR 1.1 03/08/2019   INR 1.1 03/07/2019   INR 1.0 12/14/2018    Radiology No results found.  Assessment/Plan 1. Complication of vascular access for dialysis, sequela Recommend:  The patient is doing well and currently has adequate dialysis access. The patient's dialysis center is not  reporting any access issues. Flow pattern is stable when compared to the prior ultrasound.  The patient should have a duplex ultrasound of the dialysis access in 6 months. The patient will follow-up with me in the office after each ultrasound   - VAS [place] (AVF, AVG); Future  2. ESRD (end stage renal disease) ([place]) At the present time the patient has adequate dialysis access.  Continue hemodialysis as ordered without interruption.  Avoid nephrotoxic medications and dehydration.  Further plans per nephrology  3. Bilateral carotid artery stenosis Recommend:  Given the patient's asymptomatic subcritical stenosis no further invasive testing or surgery at  this time.  Continue antiplatelet therapy as prescribed Continue management of CAD, HTN and Hyperlipidemia Healthy heart diet,  encouraged exercise at least 4 times per week Follow up in 12 months with duplex ultrasound and physical exam   4. Essential hypertension Continue antihypertensive medications as already ordered, these medications have been reviewed and there are no changes at this time.   5. Atherosclerosis of native coronary artery of native heart without angina pectoris Continue cardiac and antihypertensive medications as already ordered and reviewed, no changes at this time.  Continue statin as ordered and reviewed, no changes at this time  Nitrates PRN for chest pain    Hortencia Pilar, MD  04/11/2019 2:49 PM

## 2019-04-12 ENCOUNTER — Other Ambulatory Visit: Payer: Self-pay | Admitting: Physician Assistant

## 2019-04-12 ENCOUNTER — Other Ambulatory Visit: Payer: Self-pay | Admitting: *Deleted

## 2019-04-12 MED ORDER — TICAGRELOR 60 MG PO TABS: 60.0000 mg | ORAL_TABLET | Freq: Two times a day (BID) | ORAL | 3 refills | Status: DC

## 2019-04-15 ENCOUNTER — Encounter (INDEPENDENT_AMBULATORY_CARE_PROVIDER_SITE_OTHER): Payer: Self-pay | Admitting: Vascular Surgery

## 2019-04-15 ENCOUNTER — Other Ambulatory Visit: Payer: Self-pay

## 2019-04-15 MED ORDER — TICAGRELOR 60 MG PO TABS: 60.0000 mg | ORAL_TABLET | Freq: Two times a day (BID) | ORAL | 3 refills | Status: DC

## 2019-04-16 ENCOUNTER — Telehealth (INDEPENDENT_AMBULATORY_CARE_PROVIDER_SITE_OTHER): Payer: Self-pay | Admitting: Nurse Practitioner

## 2019-04-16 NOTE — Telephone Encounter (Signed)
Tammy, Please contact patient in 4 weeks to have a CBC repeated thx

## 2019-04-18 ENCOUNTER — Ambulatory Visit (INDEPENDENT_AMBULATORY_CARE_PROVIDER_SITE_OTHER): Payer: Medicare HMO | Admitting: Nurse Practitioner

## 2019-04-24 ENCOUNTER — Other Ambulatory Visit (INDEPENDENT_AMBULATORY_CARE_PROVIDER_SITE_OTHER): Payer: Self-pay | Admitting: *Deleted

## 2019-04-24 DIAGNOSIS — D500 Iron deficiency anemia secondary to blood loss (chronic): Secondary | ICD-10-CM

## 2019-04-24 NOTE — Telephone Encounter (Signed)
Lab has been ordered and the patient will be sent a letter as a reminder.

## 2019-05-01 ENCOUNTER — Telehealth (INDEPENDENT_AMBULATORY_CARE_PROVIDER_SITE_OTHER): Payer: Self-pay | Admitting: Internal Medicine

## 2019-05-01 NOTE — Telephone Encounter (Signed)
Patient called wanted to let NUR know she is still having blood in her stools  -  Ph# [phone]

## 2019-05-02 ENCOUNTER — Ambulatory Visit (INDEPENDENT_AMBULATORY_CARE_PROVIDER_SITE_OTHER): Payer: Medicare HMO | Admitting: Cardiovascular Disease

## 2019-05-02 ENCOUNTER — Other Ambulatory Visit (INDEPENDENT_AMBULATORY_CARE_PROVIDER_SITE_OTHER): Payer: Self-pay | Admitting: Nurse Practitioner

## 2019-05-02 ENCOUNTER — Encounter: Payer: Self-pay | Admitting: Cardiovascular Disease

## 2019-05-02 VITALS — BP 158/64 | HR 72 | Ht 61.0 in | Wt 124.0 lb

## 2019-05-02 DIAGNOSIS — N186 End stage renal disease: Secondary | ICD-10-CM

## 2019-05-02 DIAGNOSIS — I10 Essential (primary) hypertension: Secondary | ICD-10-CM | POA: Diagnosis not present

## 2019-05-02 DIAGNOSIS — I480 Paroxysmal atrial fibrillation: Secondary | ICD-10-CM | POA: Diagnosis not present

## 2019-05-02 DIAGNOSIS — I25708 Atherosclerosis of coronary artery bypass graft(s), unspecified, with other forms of angina pectoris: Secondary | ICD-10-CM | POA: Diagnosis not present

## 2019-05-02 DIAGNOSIS — I472 Ventricular tachycardia, unspecified: Secondary | ICD-10-CM

## 2019-05-02 DIAGNOSIS — E785 Hyperlipidemia, unspecified: Secondary | ICD-10-CM

## 2019-05-02 DIAGNOSIS — D500 Iron deficiency anemia secondary to blood loss (chronic): Secondary | ICD-10-CM

## 2019-05-02 NOTE — Telephone Encounter (Signed)
Talked with the patient. Reminded her that she needed to have lab work drawn on Monday 05/06/2019. She states that she is still seeing blood in her stools. Last night before going to bed she hurt at the upper part of her stomach, this morning she is having pain in the lower stomach.

## 2019-05-02 NOTE — Progress Notes (Signed)
SUBJECTIVE: The patient presents for routine follow-up.  She denies chest pain, palpitations, leg swelling, and shortness of breath.  Hemoglobin 10.7 on 04/08/2019.  She had some bright red blood per rectum and is scheduled to see GI on 07/02/2019.    Review of Systems: As per "subjective", otherwise negative.  Allergies  Allergen Reactions  . Aspirin Other (See Comments)    High Doses Mess up her stomach; "makes my bowels have blood in them". Takes 81 mg EC Aspirin   . Penicillins Other (See Comments)    SYNCOPE? , "makes me real weak when I take it; like I'll pass out"  Has patient had a PCN reaction causing immediate rash, facial/tongue/throat swelling, SOB or lightheadedness with hypotension: Yes Has patient had a PCN reaction causing severe rash involving mucus membranes or skin necrosis: no Has patient had a PCN reaction that required hospitalization no Has patient had a PCN reaction occurring within the last 10 years: no If all of the above  . Amlodipine Swelling  . Bactrim [Sulfamethoxazole-Trimethoprim] Rash  . Contrast Media [Iodinated Diagnostic Agents] Itching  . Iron Itching and Other (See Comments)    "they gave me iron in dialysis; had to give me Benadryl cause I had to have the iron" (05/02/2012)  . Nitrofurantoin Hives  . Tylenol [Acetaminophen] Itching and Other (See Comments)    Makes her feet on fire per pt  . Gabapentin Other (See Comments)    Unknown reaction  . Hydralazine Itching  . Dexilant [Dexlansoprazole] Other (See Comments)    Upset stomach  . Levaquin [Levofloxacin In D5w] Rash  . Morphine And Related Itching and Other (See Comments)    Itching in feet  . Plavix [Clopidogrel Bisulfate] Rash  . Protonix [Pantoprazole Sodium] Rash  . Venofer [Ferric Oxide] Itching and Other (See Comments)    Patient reports using Benadryl prior to doses as [facility]    Current Outpatient Medications  Medication Sig Dispense Refill  .  ALPRAZolam (XANAX) 0.5 MG tablet Take 0.5 mg by mouth daily.    Marland Kitchen amiodarone (PACERONE) 200 MG tablet Take 1 tablet (200 mg total) by mouth daily. 90 tablet 1  . aspirin EC 81 MG tablet Take 1 tablet (81 mg total) by mouth daily. Restart on 03/13/19    . benzonatate (TESSALON) 100 MG capsule TAKE 1 CAPSULE BY MOUTH THREE TIMES DAILY AS NEEDED FOR COUGH    . cloNIDine (CATAPRES) 0.1 MG tablet Take 1 tablet (0.1 mg total) by mouth 2 (two) times daily. 60 tablet 2  . diphenhydrAMINE (BENADRYL) 25 mg capsule Take 50 mg by mouth daily as needed for itching or allergies.     . fluticasone (FLONASE) 50 MCG/ACT nasal spray Place 1 spray at bedtime as needed into both nostrils for allergies.     . isosorbide mononitrate (IMDUR) 120 MG 24 hr tablet Take 120 mg by mouth daily.    Marland Kitchen lidocaine-prilocaine (EMLA) cream Apply 1 application topically every Monday, Wednesday, and Friday. Prior to dialysis    . loratadine (CLARITIN) 10 MG tablet Take 1 tablet (10 mg total) by mouth daily as needed for allergies. 30 tablet 0  . multivitamin (RENA-VIT) TABS tablet Take 1 tablet by mouth daily.    . nitroGLYCERIN (NITROSTAT) 0.4 MG SL tablet DISSOLVE ONE TABLET UNDER THE TONGUE EVERY 5 MINUTES AS NEEDED FOR CHEST PAIN.  DO NOT EXCEED A TOTAL OF 3 DOSES IN 15 MINUTES 25 tablet 2  . nystatin ointment (  MYCOSTATIN) APPLY A SMALL AMOUNT TOPICALLY TWICE DAILY FOR 7 DAYS TO EXTERNAL ANAL AREA FOR ANAL ITCHINESS    . omeprazole (PRILOSEC) 20 MG capsule Take 1 capsule by mouth once daily 90 capsule 0  . ondansetron (ZOFRAN-ODT) 4 MG disintegrating tablet DISSOLVE 1 TABLET IN MOUTH TWICE DAILY AS NEEDED    . sevelamer carbonate (RENVELA) 800 MG tablet TAKE 3 TABLETS BY MOUTH THREE TIMES DAILY WITH MEALS AND 2 TABLETS WITH SNACKS    . simvastatin (ZOCOR) 20 MG tablet TAKE ONE TABLET (20 MG) BY MOUTH DAILY AT BEDTIME 90 tablet 3  . ticagrelor (BRILINTA) 60 MG TABS tablet Take 1 tablet (60 mg total) by mouth 2 (two) times daily.  Restart on 03/13/19 60 tablet 3  . carvedilol (COREG) 12.5 MG tablet Take 1 tablet (12.5 mg total) by mouth 2 (two) times daily with a meal. Additional refills per PCP 180 tablet 1   No current facility-administered medications for this visit.     Past Medical History:  Diagnosis Date  . Acute on chronic respiratory failure with hypoxia ([place]) 10/10/2016  . Anxiety   . Arthritis   . AVM (arteriovenous malformation) of colon   . CAD (coronary artery disease)    a. s/p CABG in 2013 b. most recent DES to D1 in 10/2016 with cath showing patent LIMA-LAD, SVG-RI, SVG-Mrg and SVG-RCA  c. NST in 02/2018 showing scar with no significant ischemia  . Carotid artery disease ([place])    a. 63-89% LICA, 09/7340   . Chronic bronchitis ([place])   . Chronic diastolic CHF (congestive heart failure) ([place])    a. 02/2012 Echo EF 60-65%, nl wall motion, Gr 1 DD, mod MR  . Colon cancer ([place]) 1992  . Esophageal stricture   . ESRD on hemodialysis ([place])    ESRD due to HTN, started dialysis 2011 and gets HD at [facility] with Dr Hinda Lenis on MWF schedule.  Access is LUA AVF as of Sept 2014.   Marland Kitchen GERD (gastroesophageal reflux disease)   . High cholesterol 12/2011  . History of blood transfusion 07/2011; 12/2011; 01/2012 X 2; 04/2012  . History of gout   . History of lower GI bleeding   . Hypertension   . Iron deficiency anemia   . Mitral regurgitation    a. Moderate by echo, 02/2012  . Myocardial infarction ([place])   . Ovarian cancer ([place]) 1992  . Pneumonia ~ 2009  . PUD (peptic ulcer disease)   . TIA (transient ischemic attack)     Past Surgical History:  Procedure Laterality Date  . A/V SHUNTOGRAM Left 03/19/2019   Procedure: A/V SHUNTOGRAM;  Surgeon: Katha Cabal, MD;  Location: [place] [facility];  Service: Cardiovascular;  Laterality: Left;  . ABDOMINAL HYSTERECTOMY  1992  . APPENDECTOMY  06/1990  . AV FISTULA PLACEMENT  07/2009   left upper arm  . AV FISTULA PLACEMENT Right 09/06/2016   Procedure: RIGHT  FOREARM ARTERIOVENOUS (AV) GRAFT;  Surgeon: Elam Dutch, MD;  Location: [facility] OR;  Service: Vascular;  Laterality: Right;  . AV FISTULA PLACEMENT N/A 02/24/2017   Procedure: INSERTION OF ARTERIOVENOUS (AV) GORE-TEX GRAFT ARM (BRACHIAL AXILLARY);  Surgeon: Katha Cabal, MD;  Location: [facility] ORS;  Service: Vascular;  Laterality: N/A;  . [place] Right 09/06/2016   Procedure: REMOVAL OF Right Arm ARTERIOVENOUS GORETEX GRAFT and Vein Patch angioplasty of brachial artery;  Surgeon: Angelia Mould, MD;  Location: [place];  Service: Vascular;  Laterality: Right;  .  COLON RESECTION  1992  . COLON SURGERY    . COLONOSCOPY N/A 03/09/2019   Procedure: COLONOSCOPY;  Surgeon: Rogene Houston, MD;  [facility];  Service: Endoscopy;  Laterality: N/A;  . CORONARY ANGIOPLASTY WITH STENT PLACEMENT  12/15/11   "2"  . CORONARY ANGIOPLASTY WITH STENT PLACEMENT  y/2013   "1; makes total of 3" (05/02/2012)  . CORONARY ARTERY BYPASS GRAFT  06/13/2012   Procedure: CORONARY ARTERY BYPASS GRAFTING (CABG);  Surgeon: Grace Isaac, MD;  Location: [place];  Service: Open Heart Surgery;  Laterality: N/A;  cabg x four;  using left internal mammary artery, and left leg greater saphenous vein harvested endoscopically  . CORONARY STENT INTERVENTION N/A 10/13/2016   Procedure: Coronary Stent Intervention;  Surgeon: Troy Sine, MD;  Location: [place] [facility];  Service: Cardiovascular;  Laterality: N/A;  . DIALYSIS/PERMA CATHETER REMOVAL N/A 04/18/2017   Procedure: DIALYSIS/PERMA CATHETER REMOVAL;  Surgeon: Katha Cabal, MD;  Location: [place] [facility];  Service: Cardiovascular;  Laterality: N/A;  . DILATION AND CURETTAGE OF UTERUS    . ESOPHAGOGASTRODUODENOSCOPY  01/20/2012   Procedure: ESOPHAGOGASTRODUODENOSCOPY (EGD);  Surgeon: Ladene Artist, MD,FACG;  Location: [facility] ENDOSCOPY;  Service: Endoscopy;  Laterality: N/A;  . ESOPHAGOGASTRODUODENOSCOPY N/A 03/26/2013   Procedure:  ESOPHAGOGASTRODUODENOSCOPY (EGD);  Surgeon: Irene Shipper, MD;  Location: [facility] ENDOSCOPY;  Service: Endoscopy;  Laterality: N/A;  . ESOPHAGOGASTRODUODENOSCOPY N/A 04/30/2015   Procedure: ESOPHAGOGASTRODUODENOSCOPY (EGD);  Surgeon: Rogene Houston, MD;  [facility];  Service: Endoscopy;  Laterality: N/A;  1pm - moved to 10/20 @ 1:10  . ESOPHAGOGASTRODUODENOSCOPY N/A 07/29/2016   Procedure: ESOPHAGOGASTRODUODENOSCOPY (EGD);  Surgeon: Manus Gunning, MD;  Location: [place];  Service: Gastroenterology;  Laterality: N/A;  enteroscopy  . GIVENS CAPSULE STUDY N/A 03/07/2019   Procedure: GIVENS CAPSULE STUDY;  Surgeon: Rogene Houston, MD;  [facility];  Service: Endoscopy;  Laterality: N/A;  7:30  . INTRAOPERATIVE TRANSESOPHAGEAL ECHOCARDIOGRAM  06/13/2012   Procedure: INTRAOPERATIVE TRANSESOPHAGEAL ECHOCARDIOGRAM;  Surgeon: Grace Isaac, MD;  Location: [place];  Service: Open Heart Surgery;  Laterality: N/A;  . IR GENERIC HISTORICAL  07/26/2016   IR FLUORO GUIDE CV LINE RIGHT 07/26/2016 Greggory Keen, MD [facility]  . IR GENERIC HISTORICAL  07/26/2016   IR US GUIDE VASC ACCESS RIGHT 07/26/2016 Greggory Keen, MD [facility]  . IR GENERIC HISTORICAL  08/02/2016   IR US GUIDE VASC ACCESS RIGHT 08/02/2016 Greggory Keen, MD [facility]  . IR GENERIC HISTORICAL  08/02/2016   IR FLUORO GUIDE CV LINE RIGHT 08/02/2016 Greggory Keen, MD [facility]  . IR RADIOLOGY PERIPHERAL GUIDED IV START  03/28/2017  . IR US GUIDE VASC ACCESS RIGHT  03/28/2017  . LEFT HEART CATH AND CORONARY ANGIOGRAPHY N/A 09/20/2016   Procedure: Left Heart Cath and Coronary Angiography;  Surgeon: [name], MD;  Location: [place] [facility];  Service: Cardiovascular;  Laterality: N/A;  . LEFT HEART CATH AND CORS/GRAFTS ANGIOGRAPHY N/A 10/13/2016   Procedure: Left Heart Cath and Cors/Grafts Angiography;  Surgeon: Troy Sine, MD;  Location: [place] [facility];  Service: Cardiovascular;  Laterality:  N/A;  . LEFT HEART CATH AND CORS/GRAFTS ANGIOGRAPHY N/A 07/13/2018   Procedure: LEFT HEART CATH AND CORS/GRAFTS ANGIOGRAPHY;  Surgeon: Martinique, Peter M, MD;  Location: [place] [facility];  Service: Cardiovascular;  Laterality: N/A;  . LEFT HEART CATHETERIZATION WITH CORONARY ANGIOGRAM N/A 12/15/2011   Procedure: LEFT HEART CATHETERIZATION WITH  CORONARY ANGIOGRAM;  Surgeon: Burnell Blanks, MD;  Location: [facility] CATH LAB;  Service: Cardiovascular;  Laterality: N/A;  . LEFT HEART CATHETERIZATION WITH CORONARY ANGIOGRAM N/A 01/10/2012   Procedure: LEFT HEART CATHETERIZATION WITH CORONARY ANGIOGRAM;  Surgeon: Peter M Martinique, MD;  Location: [facility] CATH LAB;  Service: Cardiovascular;  Laterality: N/A;  . LEFT HEART CATHETERIZATION WITH CORONARY ANGIOGRAM N/A 06/08/2012   Procedure: LEFT HEART CATHETERIZATION WITH CORONARY ANGIOGRAM;  Surgeon: Burnell Blanks, MD;  Location: [facility] CATH LAB;  Service: Cardiovascular;  Laterality: N/A;  . LEFT HEART CATHETERIZATION WITH CORONARY/GRAFT ANGIOGRAM N/A 12/10/2013   Procedure: LEFT HEART CATHETERIZATION WITH Beatrix Fetters;  Surgeon: [name], MD;  Location: [facility] CATH LAB;  Service: Cardiovascular;  Laterality: N/A;  . OVARY SURGERY     ovarian cancer  . POLYPECTOMY  03/09/2019   Procedure: POLYPECTOMY;  Surgeon: Rogene Houston, MD;  [facility];  Service: Endoscopy;;  cecal   . REVISION OF ARTERIOVENOUS GORETEX GRAFT N/A 02/24/2017   Procedure: REVISION OF ARTERIOVENOUS GORETEX GRAFT (RESECTION);  Surgeon: Katha Cabal, MD;  Location: [facility] ORS;  Service: Vascular;  Laterality: N/A;  [place] N/A 10/15/2013   Procedure: Fistulogram;  Surgeon: Serafina Mitchell, MD;  Location: [facility] CATH LAB;  Service: Cardiovascular;  Laterality: N/A;  . THROMBECTOMY / ARTERIOVENOUS GRAFT REVISION  2011   left upper arm  . TUBAL LIGATION  1980's  . UPPER EXTREMITY ANGIOGRAPHY Bilateral 12/06/2016   Procedure: Upper Extremity Angiography;   Surgeon: Katha Cabal, MD;  Location: [place] [facility];  Service: Cardiovascular;  Laterality: Bilateral;  . UPPER EXTREMITY INTERVENTION Left 06/06/2017   Procedure: UPPER EXTREMITY INTERVENTION;  Surgeon: Katha Cabal, MD;  Location: [place] [facility];  Service: Cardiovascular;  Laterality: Left;    Social History   Socioeconomic History  . Marital status: Married    Spouse name: [name]  . Number of children: Not on file  . Years of education: Not on file  . Highest education level: Not on file  Occupational History  . Not on file  Social Needs  . Financial resource strain: Not very hard  . Food insecurity    Worry: Never true    Inability: Never true  . Transportation needs    Medical: No    Non-medical: No  Tobacco Use  . Smoking status: Never Smoker  . Smokeless tobacco: Never Used  Substance and Sexual Activity  . Alcohol use: No    Alcohol/week: 0.0 standard drinks  . Drug use: No  . Sexual activity: Yes    Birth control/protection: Surgical  Lifestyle  . Physical activity    Days per week: Patient refused    Minutes per session: Patient refused  . Stress: Only a little  Relationships  . Social Herbalist on phone: Patient refused    Gets together: Patient refused    Attends religious service: Patient refused    Active member of club or organization: Patient refused    Attends meetings of clubs or organizations: Patient refused    Relationship status: Patient refused  . Intimate partner violence    Fear of current or ex partner: No    Emotionally abused: No    Physically abused: No    Forced sexual activity: No  Other Topics Concern  . Not on file  Social History Narrative   Lives in [place], [place] with husband.  Dialysis pt - [facility].     Vitals:  05/02/19 1407  BP: (!) 158/64  Pulse: 72  SpO2: 98%  Weight: 124 lb (56.2 kg)  Height: 5\' 1"  (1.549 m)    Wt Readings from Last 3 Encounters:  05/02/19 124 lb (56.2 kg)   04/11/19 124 lb (56.2 kg)  04/02/19 124 lb 6.4 oz (56.4 kg)     PHYSICAL EXAM General: NAD HEENT: Normal. Neck: No JVD, no thyromegaly. Lungs: Clear to auscultation bilaterally with normal respiratory effort. CV: Regular rate and rhythm, normal S1/S2, no S3/S4, no murmur. No pretibial or periankle edema.   Abdomen: Soft, nontender, no distention.  Neurologic: Alert and oriented.  Psych: Normal affect. Skin: Normal. Musculoskeletal: No gross deformities.      Labs: Lab Results  Component Value Date/Time   K 3.6 03/09/2019 04:44 AM   BUN 19 03/09/2019 04:44 AM   CREATININE 5.28 (H) 03/09/2019 04:44 AM   CREATININE 6.57 (H) 03/05/2019 11:59 AM   ALT 8 03/08/2019 09:54 AM   TSH 1.536 05/19/2016 11:13 AM   TSH 2.864 08/24/2014 10:00 AM   HGB 10.7 (L) 04/08/2019 04:06 PM     Lipids: Lab Results  Component Value Date/Time   LDLCALC 26 10/16/2017 05:39 AM   CHOL 93 10/16/2017 05:39 AM   TRIG 56 10/16/2017 05:39 AM   HDL 56 10/16/2017 05:39 AM       Cardiac catheterization 07/13/2018:   Ost RPDA to RPDA lesion is 75% stenosed.  Mid LM to Dist LM lesion is 60% stenosed.  Ost Ramus to Ramus lesion is 80% stenosed.  Ost Cx to Mid Cx lesion is 99% stenosed.  Prox LAD lesion is 90% stenosed.  Ost 1st Diag to 1st Diag lesion is 100% stenosed.  Ost RCA to Prox RCA lesion is 100% stenosed.  LIMA graft was visualized by angiography and is large.  The graft exhibits no disease.  SVG graft was visualized by angiography and is large.  The graft exhibits no disease.  SVG graft was visualized by angiography and is normal in caliber.  SVG graft was visualized by angiography and is large.  The graft exhibits no disease.  The left ventricular systolic function is normal.  LV end diastolic pressure is mildly elevated.  The left ventricular ejection fraction is 55-65% by visual estimate.   1. Severe 3 vessel occlusive CAD    - 60% distal left main    - 90% mid  LAD    - 100% first diagonal at prior stent site    - 99% proximal LCx at prior stent site    - 80% ramus intermediate.    - 100% ostial RCA at prior stent site    - 75% proximal PDA 2. Patent LIMA to the LAD 3. Patent SVG to ramus intermediate 4. Patent SVG to OM 5. Patent SVG to the distal RCA. 6. Good LV function 7. Mildly elevated LVEDP  Plan: Compared to prior cath in April 2018 the diagonal is now occluded. There is progression of disease in the PDA. It is unclear if this is the reason for her chest pain. She is severely hypertensive and EDP is mildly elevated. I would recommend medical management with good BP control. If she has refractory angina despite good BP and volume management we could consider PCI of the PDA but this would have to be done through the SVG and in general she has not responded well to stenting in the past with all prior stents occluded. I will increase her Coreg dose. She will need dialysis  in am.    Echocardiogram 12/14/2018:  1. The left ventricle has normal systolic function with an ejection fraction of 60-65%. The cavity size was normal. There is mild concentric left ventricular hypertrophy. Left ventricular diastolic Doppler parameters are consistent with impaired  relaxation. Elevated left ventricular end-diastolic pressure No evidence of left ventricular regional wall motion abnormalities.  2. Left atrial size was mildly dilated.  3. The mitral valve is grossly normal. Mild thickening of the mitral valve leaflet. There is mild mitral annular calcification present. Mitral valve regurgitation is mild to moderate by color flow Doppler.  4. The tricuspid valve is grossly normal.  5. The aortic valve is tricuspid. Mild thickening of the aortic valve.  6. Moderate aortic annular calcification.  7. The aortic root is normal in size and structure.  ASSESSMENT AND PLAN: 1.  Coronary artery disease: Cardiac catheterization from January 2020 reviewed above with  occlusion of diagonal and progression of PDA disease.  Continue medical therapy.  Continue aspirin, Brilinta (intolerant of Plavix), simvastatin, Imdur, and carvedilol.  2.  Accelerated hypertension: She has a long history of adjusting antihypertensive therapy on her own.  BP is elevated.  This will need further monitoring.  3.  End-stage renal disease: On hemodialysis.  4.  Hyperlipidemia: Continue statin therapy.  5.  History of paroxysmal atrial fibrillation/nonsustained ventricular tachycardia: Currently on amiodarone.  Not on systemic anticoagulation due to prior GI bleed.    Disposition: Follow up 6 months   Kate Sable, M.D., F.A.C.C.

## 2019-05-02 NOTE — Patient Instructions (Addendum)
Medication Instructions:   Your physician recommends that you continue on your current medications as directed. Please refer to the Current Medication list given to you today.  Labwork:  NONE  Testing/Procedures:  NONE  Follow-Up:  Your physician recommends that you schedule a follow-up appointment in: 6 months. You will receive a reminder letter in the mail in about 4 months reminding you to call and schedule your appointment. If you don't receive this letter, please contact our office.  Any Other Special Instructions Will Be Listed Below (If Applicable).  If you need a refill on your cardiac medications before your next appointment, please call your pharmacy. 

## 2019-05-03 ENCOUNTER — Telehealth (INDEPENDENT_AMBULATORY_CARE_PROVIDER_SITE_OTHER): Payer: Self-pay | Admitting: Nurse Practitioner

## 2019-05-03 LAB — CBC WITH DIFFERENTIAL/PLATELET
Absolute Monocytes: 660 cells/uL (ref 200–950)
Basophils Absolute: 83 cells/uL (ref 0–200)
Basophils Relative: 1.5 %
Eosinophils Absolute: 220 cells/uL (ref 15–500)
Eosinophils Relative: 4 %
HCT: 35.9 % (ref 35.0–45.0)
Hemoglobin: 11.3 g/dL — ABNORMAL LOW (ref 11.7–15.5)
Lymphs Abs: 809 cells/uL — ABNORMAL LOW (ref 850–3900)
MCH: 32.8 pg (ref 27.0–33.0)
MCHC: 31.5 g/dL — ABNORMAL LOW (ref 32.0–36.0)
MCV: 104.4 fL — ABNORMAL HIGH (ref 80.0–100.0)
MPV: 14 fL — ABNORMAL HIGH (ref 7.5–12.5)
Monocytes Relative: 12 %
Neutro Abs: 3729 cells/uL (ref 1500–7800)
Neutrophils Relative %: 67.8 %
Platelets: 202 10*3/uL (ref 140–400)
RBC: 3.44 10*6/uL — ABNORMAL LOW (ref 3.80–5.10)
RDW: 17.4 % — ABNORMAL HIGH (ref 11.0–15.0)
Total Lymphocyte: 14.7 %
WBC: 5.5 10*3/uL (ref 3.8–10.8)

## 2019-05-03 LAB — IRON,TIBC AND FERRITIN PANEL
%SAT: 28 % (calc) (ref 16–45)
Ferritin: 820 ng/mL — ABNORMAL HIGH (ref 16–288)
Iron: 59 ug/dL (ref 45–160)
TIBC: 210 mcg/dL (calc) — ABNORMAL LOW (ref 250–450)

## 2019-05-03 NOTE — Telephone Encounter (Signed)
Shawna [name], patient went to [facility] yesterday because she felt ill and passed red bloody stool. I advised pt to go to the ER if she passed more red blood/bloody stool per the rectum. Her Hg is now 11 which is higher than her prior levels. Please call patient today and provide Dr. Laural Golden with her sx update and CBC, iron panel results. thx

## 2019-05-08 ENCOUNTER — Other Ambulatory Visit (INDEPENDENT_AMBULATORY_CARE_PROVIDER_SITE_OTHER): Payer: Self-pay | Admitting: *Deleted

## 2019-05-08 DIAGNOSIS — D500 Iron deficiency anemia secondary to blood loss (chronic): Secondary | ICD-10-CM

## 2019-05-08 NOTE — Telephone Encounter (Signed)
Talked with the patient. Reminded her that she needed to have lab work drawn on Monday 05/06/2019. She states that she is still seeing blood in her stools. Last night before going to bed she hurt at the upper part of her stomach, this morning she is having pain in the lower stomach.  The above note was taken from me 6 days ago. This will need to be addressed with Dr. Laural Golden.

## 2019-05-08 NOTE — Telephone Encounter (Signed)
This note has been attached to another one , that will be addressed with Dr.Rehman.

## 2019-05-09 NOTE — Telephone Encounter (Signed)
Mitzie, pls call pt to schedule an office appointment for next week. If patient has worse symptoms or increased rectal bleeding she should go to the local ER. thx

## 2019-05-14 ENCOUNTER — Ambulatory Visit (INDEPENDENT_AMBULATORY_CARE_PROVIDER_SITE_OTHER): Payer: Medicare HMO | Admitting: Internal Medicine

## 2019-05-14 ENCOUNTER — Encounter (INDEPENDENT_AMBULATORY_CARE_PROVIDER_SITE_OTHER): Payer: Self-pay | Admitting: Internal Medicine

## 2019-05-14 ENCOUNTER — Other Ambulatory Visit: Payer: Self-pay

## 2019-05-14 DIAGNOSIS — K6389 Other specified diseases of intestine: Secondary | ICD-10-CM | POA: Diagnosis not present

## 2019-05-14 DIAGNOSIS — K625 Hemorrhage of anus and rectum: Secondary | ICD-10-CM | POA: Insufficient documentation

## 2019-05-14 DIAGNOSIS — K638219 Small intestinal bacterial overgrowth, unspecified: Secondary | ICD-10-CM | POA: Insufficient documentation

## 2019-05-14 MED ORDER — METRONIDAZOLE 250 MG PO TABS: 250.0000 mg | ORAL_TABLET | Freq: Three times a day (TID) | ORAL | 0 refills | Status: DC

## 2019-05-14 NOTE — Patient Instructions (Signed)
Notify if rectal bleeding recurs. Please call office with progress report when you finish antibiotic or earlier if you experience side-effects.

## 2019-05-14 NOTE — Progress Notes (Signed)
Presenting complaint;  Excessive flatulence and rectal bleeding.  Database and subjective:  Patient is 78 year old Afro-American female with multiple medical problems including coronary artery disease end-stage renal disease on hemodialysis as well as history of ischemic enteritis with small abscess in June 2018 responding to IV antibiotics who was hospitalized about 9 weeks ago for rectal bleeding and required transfusion.  She underwent colonoscopy on 03/09/2019 with removal of 2 small tubular adenomas along with large polyp from transverse colon which was snared piecemeal and residual polyp ablated with APC.  This polyp was a tubular adenoma as well.  Patient is on Brilinta and low-dose aspirin.  Patient called and requested to be seen.  She has multiple complaints.  She complains of abdominal bloating which gets worse after eating and she has excessive flatus.  She says at times that she has no control.  She has a bowel movement every time she eats.  She also reports intermittent cramping.  She had an episode 2 days ago lasting for 15 to 20 minutes.  Last week she had rectal bleeding when she passed moderate amount of dark blood.  She has not had any more bleeding episode.  She says her appetite is good.  She has not lost any weight.  She has nocturnal bowel movement once or twice a week.  She says most of her stools are formed or semiformed.  She does not have frank diarrhea or watery stool. She says Dr. Bronson Ing told her that she could stop low-dose aspirin but she has not done yet.  Current Medications: Outpatient Encounter Medications as of 05/14/2019  Medication Sig  . ALPRAZolam (XANAX) 0.5 MG tablet Take 0.5 mg by mouth daily.  Marland Kitchen amiodarone (PACERONE) 200 MG tablet Take 1 tablet (200 mg total) by mouth daily.  Marland Kitchen aspirin EC 81 MG tablet Take 1 tablet (81 mg total) by mouth daily. Restart on 03/13/19  . benzonatate (TESSALON) 100 MG capsule TAKE 1 CAPSULE BY MOUTH THREE TIMES DAILY AS NEEDED  FOR COUGH  . carvedilol (COREG) 12.5 MG tablet Take 1 tablet (12.5 mg total) by mouth 2 (two) times daily with a meal. Additional refills per PCP  . cloNIDine (CATAPRES) 0.1 MG tablet Take 1 tablet (0.1 mg total) by mouth 2 (two) times daily.  . diphenhydrAMINE (BENADRYL) 25 mg capsule Take 50 mg by mouth daily as needed for itching or allergies.   . fluticasone (FLONASE) 50 MCG/ACT nasal spray Place 1 spray at bedtime as needed into both nostrils for allergies.   . isosorbide mononitrate (IMDUR) 120 MG 24 hr tablet Take 120 mg by mouth daily.  Marland Kitchen lidocaine-prilocaine (EMLA) cream Apply 1 application topically every Monday, Wednesday, and Friday. Prior to dialysis  . loratadine (CLARITIN) 10 MG tablet Take 1 tablet (10 mg total) by mouth daily as needed for allergies.  . multivitamin (RENA-VIT) TABS tablet Take 1 tablet by mouth daily.  . nitroGLYCERIN (NITROSTAT) 0.4 MG SL tablet DISSOLVE ONE TABLET UNDER THE TONGUE EVERY 5 MINUTES AS NEEDED FOR CHEST PAIN.  DO NOT EXCEED A TOTAL OF 3 DOSES IN 15 MINUTES  . nystatin ointment (MYCOSTATIN) APPLY A SMALL AMOUNT TOPICALLY TWICE DAILY FOR 7 DAYS TO EXTERNAL ANAL AREA FOR ANAL ITCHINESS  . omeprazole (PRILOSEC) 20 MG capsule Take 1 capsule by mouth once daily  . ondansetron (ZOFRAN-ODT) 4 MG disintegrating tablet DISSOLVE 1 TABLET IN MOUTH TWICE DAILY AS NEEDED  . sevelamer carbonate (RENVELA) 800 MG tablet TAKE 3 TABLETS BY MOUTH THREE TIMES DAILY WITH  MEALS AND 2 TABLETS WITH SNACKS  . simvastatin (ZOCOR) 20 MG tablet TAKE ONE TABLET (20 MG) BY MOUTH DAILY AT BEDTIME  . ticagrelor (BRILINTA) 60 MG TABS tablet Take 1 tablet (60 mg total) by mouth 2 (two) times daily. Restart on 03/13/19   No facility-administered encounter medications on file as of 05/14/2019.      Objective: Blood pressure (!) 184/65, pulse 83, temperature 98.3 F (36.8 C), temperature source Oral, height 5' 1" (1.549 m), weight 123 lb (55.8 kg). Patient is alert and in no acute  distress. Conjunctiva is pink. Sclera is nonicteric Oropharyngeal mucosa is normal. No neck masses or thyromegaly noted. Cardiac exam with regular rhythm normal S1 and S2.  She has faint systolic murmur just inside the apex. Lungs are clear to auscultation. Abdomen is symmetrical.  No bruit noted.  On palpation abdomen is soft and nontender with organomegaly or masses. No LE edema or clubbing noted. She has AV fistula in left arm.  Labs/studies Results:  CBC Latest Ref Rng & Units 05/02/2019 04/08/2019 03/22/2019  WBC 3.8 - 10.8 Thousand/uL 5.5 5.4 8.5  Hemoglobin 11.7 - 15.5 g/dL 11.3(L) 10.7(L) 11.3(L)  Hematocrit 35.0 - 45.0 % 35.9 34.1(L) 35.8  Platelets 140 - 400 Thousand/uL 202 189 281    CMP Latest Ref Rng & Units 03/09/2019 03/08/2019 03/08/2019  Glucose 70 - 99 mg/dL 97 103(H) 103(H)  BUN 8 - 23 mg/dL 19 41(H) 39(H)  Creatinine 0.44 - 1.00 mg/dL 5.28(H) 7.98(H) 7.99(H)  Sodium 135 - 145 mmol/L 139 144 143  Potassium 3.5 - 5.1 mmol/L 3.6 4.8 4.8  Chloride 98 - 111 mmol/L 96(L) 104 103  CO2 22 - 32 mmol/L _0 Calcium 8.9 - 10.3 mg/dL 9.5 9.0 9.0  Total Protein 6.5 - 8.1 g/dL - - 5.5(L)  Total Bilirubin 0.3 - 1.2 mg/dL - - 0.5  Alkaline Phos 38 - 126 U/L - - 54  AST 15 - 41 U/L - - 9(L)  ALT 0 - 44 U/L - - 8    Hepatic Function Latest Ref Rng & Units 03/09/2019 03/08/2019 03/08/2019  Total Protein 6.5 - 8.1 g/dL - - 5.5(L)  Albumin 3.5 - 5.0 g/dL 3.1(L) 3.0(L) 3.0(L)  AST 15 - 41 U/L - - 9(L)  ALT 0 - 44 U/L - - 8  Alk Phosphatase 38 - 126 U/L - - 54  Total Bilirubin 0.3 - 1.2 mg/dL - - 0.5  Bilirubin, Direct 0.0 - 0.2 mg/dL - - -     Assessment:  #1.  Abdominal bloating and excessive flatulence affecting quality of patient's life.  I am concerned that she may have small intestinal bacterial overgrowth.  She also has postprandial urgency with defecation.  This symptom complex suggests IBS but she is not a candidate for antispasmodic therapy.  I would hope that she  would respond to antibiotic.  #2.  Rectal bleeding.  Patient had an episode of rectal bleeding last week.  Her hemoglobin 2 weeks ago was 11.3 which is very good for her as she has had chronic GI blood loss for which she is undergone evaluation in the past including given capsule study over 2 months ago.  Last colonoscopy was about 9 weeks ago revealing sigmoid diverticulosis and 2 small and one large tubular adenoma per.  That episode and this episode possibly due to diverticular bleed.  Patient is on Brilinta and aspirin.  Since Dr. Bronson Ing agrees aspirin should be discontinued.  #3. GERD.  Symptoms are  well controlled with therapy.   Plan:  Metronidazole to 50 mg by mouth after each meal for 10 days. Patient informed of potential side effects including metallic taste which should resolve when she stops the medication. Patient will go to the lab for CBC later this week or next. Patient advised to keep daily symptom diary as to bloating and flatulence and call with progress report when she finishes metronidazole. Patient advised to discontinue aspirin. If patient has gross rectal bleeding she will call office or report to emergency room. Office visit in 3 months.

## 2019-05-16 ENCOUNTER — Other Ambulatory Visit (INDEPENDENT_AMBULATORY_CARE_PROVIDER_SITE_OTHER): Payer: Self-pay | Admitting: *Deleted

## 2019-05-16 DIAGNOSIS — R109 Unspecified abdominal pain: Secondary | ICD-10-CM

## 2019-05-16 MED ORDER — HYOSCYAMINE SULFATE 0.125 MG SL SUBL: 0.1250 mg | SUBLINGUAL_TABLET | Freq: Three times a day (TID) | SUBLINGUAL | 0 refills | Status: DC

## 2019-05-17 ENCOUNTER — Emergency Department (HOSPITAL_COMMUNITY)
Admission: EM | Admit: 2019-05-17 | Discharge: 2019-05-17 | Disposition: A | Discharge status: Home / Self Care | Payer: Medicare HMO | Attending: Emergency Medicine | Admitting: Emergency Medicine

## 2019-05-17 ENCOUNTER — Encounter (HOSPITAL_COMMUNITY): Payer: Self-pay | Admitting: Emergency Medicine

## 2019-05-17 ENCOUNTER — Other Ambulatory Visit: Payer: Self-pay

## 2019-05-17 DIAGNOSIS — I132 Hypertensive heart and chronic kidney disease with heart failure and with stage 5 chronic kidney disease, or end stage renal disease: Secondary | ICD-10-CM | POA: Insufficient documentation

## 2019-05-17 DIAGNOSIS — I252 Old myocardial infarction: Secondary | ICD-10-CM | POA: Diagnosis not present

## 2019-05-17 DIAGNOSIS — I5032 Chronic diastolic (congestive) heart failure: Secondary | ICD-10-CM | POA: Diagnosis not present

## 2019-05-17 DIAGNOSIS — Z79899 Other long term (current) drug therapy: Secondary | ICD-10-CM | POA: Insufficient documentation

## 2019-05-17 DIAGNOSIS — Z8673 Personal history of transient ischemic attack (TIA), and cerebral infarction without residual deficits: Secondary | ICD-10-CM | POA: Insufficient documentation

## 2019-05-17 DIAGNOSIS — Z992 Dependence on renal dialysis: Secondary | ICD-10-CM | POA: Insufficient documentation

## 2019-05-17 DIAGNOSIS — I2510 Atherosclerotic heart disease of native coronary artery without angina pectoris: Secondary | ICD-10-CM | POA: Diagnosis not present

## 2019-05-17 DIAGNOSIS — Z7982 Long term (current) use of aspirin: Secondary | ICD-10-CM | POA: Diagnosis not present

## 2019-05-17 DIAGNOSIS — N186 End stage renal disease: Secondary | ICD-10-CM | POA: Insufficient documentation

## 2019-05-17 DIAGNOSIS — Z951 Presence of aortocoronary bypass graft: Secondary | ICD-10-CM | POA: Diagnosis not present

## 2019-05-17 DIAGNOSIS — K921 Melena: Secondary | ICD-10-CM | POA: Diagnosis present

## 2019-05-17 DIAGNOSIS — Z8719 Personal history of other diseases of the digestive system: Secondary | ICD-10-CM

## 2019-05-17 LAB — COMPREHENSIVE METABOLIC PANEL
ALT: 9 U/L (ref 0–44)
AST: 7 U/L — ABNORMAL LOW (ref 15–41)
Albumin: 3.5 g/dL (ref 3.5–5.0)
Alkaline Phosphatase: 73 U/L (ref 38–126)
Anion gap: 8 (ref 5–15)
BUN: 19 mg/dL (ref 8–23)
CO2: 28 mmol/L (ref 22–32)
Calcium: 9 mg/dL (ref 8.9–10.3)
Chloride: 100 mmol/L (ref 98–111)
Creatinine, Ser: 4.2 mg/dL — ABNORMAL HIGH (ref 0.44–1.00)
GFR calc Af Amer: 11 mL/min — ABNORMAL LOW (ref >60)
GFR calc non Af Amer: 9 mL/min — ABNORMAL LOW (ref >60)
Glucose, Bld: 103 mg/dL — ABNORMAL HIGH (ref 70–99)
Potassium: 3.4 mmol/L — ABNORMAL LOW (ref 3.5–5.1)
Sodium: 136 mmol/L (ref 135–145)
Total Bilirubin: 0.4 mg/dL (ref 0.3–1.2)
Total Protein: 6.8 g/dL (ref 6.5–8.1)

## 2019-05-17 LAB — CBC
HCT: 34 % — ABNORMAL LOW (ref 36.0–46.0)
Hemoglobin: 10.4 g/dL — ABNORMAL LOW (ref 12.0–15.0)
MCH: 33.2 pg (ref 26.0–34.0)
MCHC: 30.6 g/dL (ref 30.0–36.0)
MCV: 108.6 fL — ABNORMAL HIGH (ref 80.0–100.0)
Platelets: 244 10*3/uL (ref 150–400)
RBC: 3.13 MIL/uL — ABNORMAL LOW (ref 3.87–5.11)
RDW: 18.4 % — ABNORMAL HIGH (ref 11.5–15.5)
WBC: 6.2 10*3/uL (ref 4.0–10.5)
nRBC: 0 % (ref 0.0–0.2)

## 2019-05-17 NOTE — ED Notes (Signed)
Patient given yellow grip socks at this time. Patient has a bruise on top of left foot, states that she dropped at top from a cooking dish on her foot.

## 2019-05-17 NOTE — ED Provider Notes (Signed)
[facility] EMERGENCY DEPARTMENT Provider Note   CSN: [id number] Arrival date & time: 05/17/19  1522     History   Chief Complaint Chief Complaint  Patient presents with  . Melena    HPI Shawna [name] is a 78 y.o. female.     78 year old female presents with complaint of dark stools for the past 2 days.  Patient is on Brilinta, has a history of GI bleed, was seen by her gastroenterologist on November 3 and had mentioned at her visit that she had an episode of dark stools however was not currently having dark stools, was advised to go to the ER for any further episodes.  Patient states for the past 2 days she has had intermittent fecal urgency with small output of dark stool.  Patient denies feeling short of breath, lightheaded, dizzy or weak.  No other complaints or concerns today.     Past Medical History:  Diagnosis Date  . Acute on chronic respiratory failure with hypoxia ([place]) 10/10/2016  . Anxiety   . Arthritis   . AVM (arteriovenous malformation) of colon   . CAD (coronary artery disease)    a. s/p CABG in 2013 b. most recent DES to D1 in 10/2016 with cath showing patent LIMA-LAD, SVG-RI, SVG-Mrg and SVG-RCA  c. NST in 02/2018 showing scar with no significant ischemia  . Carotid artery disease ([place])    a. 40-10% LICA, 08/7251   . Chronic bronchitis ([place])   . Chronic diastolic CHF (congestive heart failure) ([place])    a. 02/2012 Echo EF 60-65%, nl wall motion, Gr 1 DD, mod MR  . Colon cancer ([place]) 1992  . Esophageal stricture   . ESRD on hemodialysis ([place])    ESRD due to HTN, started dialysis 2011 and gets HD at [facility] with Dr Hinda Lenis on MWF schedule.  Access is LUA AVF as of Sept 2014.   Marland Kitchen GERD (gastroesophageal reflux disease)   . High cholesterol 12/2011  . History of blood transfusion 07/2011; 12/2011; 01/2012 X 2; 04/2012  . History of gout   . History of lower GI bleeding   . Hypertension   . Iron deficiency anemia   . Mitral regurgitation    a. Moderate by  echo, 02/2012  . Myocardial infarction ([place])   . Ovarian cancer ([place]) 1992  . Pneumonia ~ 2009  . PUD (peptic ulcer disease)   . TIA (transient ischemic attack)     Patient Active Problem List   Diagnosis Date Noted  . Rectal bleeding 05/14/2019  . Small intestinal bacterial overgrowth 05/14/2019  . Iron deficiency anemia 04/02/2019  . GI bleed 03/08/2019  . Gastrointestinal hemorrhage with melena 03/06/2019  . Acute respiratory failure with hypoxia ([place]) 12/25/2018  . Elevated troponin 12/14/2018  . Chest pain at rest 07/13/2018  . Hand steal syndrome ([place]) 08/01/2017  . Anemia 07/14/2017  . Coronary artery disease of bypass graft of native heart with stable angina pectoris ([place]) 06/05/2017  . Mesenteric ischemia ([place])   . Diverticulitis   . SBO (small bowel obstruction) ([place]) 03/22/2017  . Enteritis   . Complication of vascular access for dialysis 03/19/2017  . Preoperative clearance 01/25/2017  . Symptomatic anemia 10/24/2016  . H/O non-ST elevation myocardial infarction (NSTEMI) 10/24/2016  . Fluid overload 10/10/2016  . Complication from renal dialysis device 10/10/2016  . Non-ST elevation MI (NSTEMI) ([place])   . Encounter for fitting and adjustment of vascular catheter   . ESRD (end stage renal disease) ([place])   .  Heme positive stool   . Demand ischemia ([place]) 07/27/2016  . Hypertensive emergency 07/08/2016  . Acute on chronic respiratory failure with hypoxia ([place])   . Cardiac arrest ([place])   . Palliative care encounter   . Goals of care, counseling/discussion   . Hypertensive crisis without congestive heart failure 05/09/2016  . Acute pulmonary edema ([place]) 04/06/2016  . Acute respiratory failure ([place]) 04/06/2016  . Hypertensive crisis 01/27/2016  . History of colon cancer 01/27/2016  . History of ovarian cancer 01/27/2016  . Hypertensive urgency 01/27/2016  . Atrial fibrillation ([place]) 10/14/2015  . Coronary angioplasty status 10/14/2015  . Malignant neoplasm of  right ovary ([place]) 10/14/2015  . Narrow complex tachycardia ([place]) 09/08/2015  . SVT (supraventricular tachycardia) ([place]) 09/08/2015  . Influenza A 08/30/2015  . Acute on chronic diastolic CHF (congestive heart failure) ([place]) 05/04/2015  . Unstable angina ([place]) 05/03/2015  . Essential hypertension   . Pain in joint, lower leg 08/14/2014  . Dacryocystitis 05/29/2013  . Chronic diastolic CHF (congestive heart failure) ([place]) 03/22/2013  . GI bleeding 03/21/2013  . Acute blood loss anemia 03/21/2013  . Vaginal odor 03/12/2013  . Vaginal discharge 03/12/2013  . Occlusion and stenosis of carotid artery without mention of cerebral infarction 01/24/2013  . Hx of CABG 07/05/2012  . Carotid artery disease ([place]) 07/05/2012  . Mitral regurgitation 06/12/2012  . Pneumonia 06/09/2012  . Non-STEMI (non-ST elevated myocardial infarction) ([place]) 06/08/2012  . Ischemic chest pain ([place]) 03/01/2012  . 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 GERD (gastroesophageal reflux disease) 01/09/2012  . HLD (hyperlipidemia) 01/05/2012  . Atherosclerotic heart disease of native coronary artery without angina pectoris 12/16/2011  . ESRD on hemodialysis ([place]) 12/16/2011  . Anxiety disorder 05/04/2011  . Anemia in chronic kidney disease 04/29/2011  . Secondary hyperparathyroidism of renal origin ([place]) 04/29/2011  . End stage renal disease ([place]) 04/29/2011  . Gout 04/29/2011  . Hypertensive chronic kidney disease with stage 5 chronic kidney disease or end stage renal disease ([place]) 04/29/2011    Past Surgical History:  Procedure Laterality Date  . A/V SHUNTOGRAM Left 03/19/2019   Procedure: A/V SHUNTOGRAM;  Surgeon: Katha Cabal, MD;  Location: [place] CV LAB;  Service: Cardiovascular;  Laterality: Left;  . ABDOMINAL HYSTERECTOMY  1992  . APPENDECTOMY  06/1990  . AV FISTULA PLACEMENT  07/2009   left upper arm  . AV FISTULA PLACEMENT Right 09/06/2016   Procedure: RIGHT  FOREARM ARTERIOVENOUS (AV) GRAFT;  Surgeon: Elam Dutch, MD;  Location: [facility] OR;  Service: Vascular;  Laterality: Right;  . AV FISTULA PLACEMENT N/A 02/24/2017   Procedure: INSERTION OF ARTERIOVENOUS (AV) GORE-TEX GRAFT ARM (BRACHIAL AXILLARY);  Surgeon: Katha Cabal, MD;  Location: [facility] ORS;  Service: Vascular;  Laterality: N/A;  . [place] Right 09/06/2016   Procedure: REMOVAL OF Right Arm ARTERIOVENOUS GORETEX GRAFT and Vein Patch angioplasty of brachial artery;  Surgeon: Angelia Mould, MD;  Location: [place];  Service: Vascular;  Laterality: Right;  . COLON RESECTION  1992  . COLON SURGERY    . COLONOSCOPY N/A 03/09/2019   Procedure: COLONOSCOPY;  Surgeon: Rogene Houston, MD;  [facility];  Service: Endoscopy;  Laterality: N/A;  . CORONARY ANGIOPLASTY WITH STENT PLACEMENT  12/15/11   "2"  . CORONARY ANGIOPLASTY WITH STENT PLACEMENT  y/2013   "1; makes total of 3" (05/02/2012)  . CORONARY ARTERY BYPASS GRAFT  06/13/2012   Procedure: CORONARY ARTERY BYPASS GRAFTING (CABG);  Surgeon:  Grace Isaac, MD;  Location: [place];  Service: Open Heart Surgery;  Laterality: N/A;  cabg x four;  using left internal mammary artery, and left leg greater saphenous vein harvested endoscopically  . CORONARY STENT INTERVENTION N/A 10/13/2016   Procedure: Coronary Stent Intervention;  Surgeon: Troy Sine, MD;  Location: [place] CV LAB;  Service: Cardiovascular;  Laterality: N/A;  . DIALYSIS/PERMA CATHETER REMOVAL N/A 04/18/2017   Procedure: DIALYSIS/PERMA CATHETER REMOVAL;  Surgeon: Katha Cabal, MD;  Location: [place] CV LAB;  Service: Cardiovascular;  Laterality: N/A;  . DILATION AND CURETTAGE OF UTERUS    . ESOPHAGOGASTRODUODENOSCOPY  01/20/2012   Procedure: ESOPHAGOGASTRODUODENOSCOPY (EGD);  Surgeon: Ladene Artist, MD,FACG;  Location: [facility] ENDOSCOPY;  Service: Endoscopy;  Laterality: N/A;  . ESOPHAGOGASTRODUODENOSCOPY N/A 03/26/2013   Procedure:  ESOPHAGOGASTRODUODENOSCOPY (EGD);  Surgeon: Irene Shipper, MD;  Location: [facility] ENDOSCOPY;  Service: Endoscopy;  Laterality: N/A;  . ESOPHAGOGASTRODUODENOSCOPY N/A 04/30/2015   Procedure: ESOPHAGOGASTRODUODENOSCOPY (EGD);  Surgeon: Rogene Houston, MD;  [facility];  Service: Endoscopy;  Laterality: N/A;  1pm - moved to 10/20 @ 1:10  . ESOPHAGOGASTRODUODENOSCOPY N/A 07/29/2016   Procedure: ESOPHAGOGASTRODUODENOSCOPY (EGD);  Surgeon: Manus Gunning, MD;  Location: [place];  Service: Gastroenterology;  Laterality: N/A;  enteroscopy  . GIVENS CAPSULE STUDY N/A 03/07/2019   Procedure: GIVENS CAPSULE STUDY;  Surgeon: Rogene Houston, MD;  [facility];  Service: Endoscopy;  Laterality: N/A;  7:30  . INTRAOPERATIVE TRANSESOPHAGEAL ECHOCARDIOGRAM  06/13/2012   Procedure: INTRAOPERATIVE TRANSESOPHAGEAL ECHOCARDIOGRAM;  Surgeon: Grace Isaac, MD;  Location: [place];  Service: Open Heart Surgery;  Laterality: N/A;  . IR GENERIC HISTORICAL  07/26/2016   IR FLUORO GUIDE CV LINE RIGHT 07/26/2016 Greggory Keen, MD [facility]  . IR GENERIC HISTORICAL  07/26/2016   IR US GUIDE VASC ACCESS RIGHT 07/26/2016 Greggory Keen, MD [facility]  . IR GENERIC HISTORICAL  08/02/2016   IR US GUIDE VASC ACCESS RIGHT 08/02/2016 Greggory Keen, MD [facility]  . IR GENERIC HISTORICAL  08/02/2016   IR FLUORO GUIDE CV LINE RIGHT 08/02/2016 Greggory Keen, MD [facility]  . IR RADIOLOGY PERIPHERAL GUIDED IV START  03/28/2017  . IR US GUIDE VASC ACCESS RIGHT  03/28/2017  . LEFT HEART CATH AND CORONARY ANGIOGRAPHY N/A 09/20/2016   Procedure: Left Heart Cath and Coronary Angiography;  Surgeon: [name], MD;  Location: [place] CV LAB;  Service: Cardiovascular;  Laterality: N/A;  . LEFT HEART CATH AND CORS/GRAFTS ANGIOGRAPHY N/A 10/13/2016   Procedure: Left Heart Cath and Cors/Grafts Angiography;  Surgeon: Troy Sine, MD;  Location: [place] [facility] LAB;  Service: Cardiovascular;  Laterality:  N/A;  . LEFT HEART CATH AND CORS/GRAFTS ANGIOGRAPHY N/A 07/13/2018   Procedure: LEFT HEART CATH AND CORS/GRAFTS ANGIOGRAPHY;  Surgeon: Martinique, Peter M, MD;  Location: [place] CV LAB;  Service: Cardiovascular;  Laterality: N/A;  . LEFT HEART CATHETERIZATION WITH CORONARY ANGIOGRAM N/A 12/15/2011   Procedure: LEFT HEART CATHETERIZATION WITH CORONARY ANGIOGRAM;  Surgeon: Burnell Blanks, MD;  Location: [facility] CATH LAB;  Service: Cardiovascular;  Laterality: N/A;  . LEFT HEART CATHETERIZATION WITH CORONARY ANGIOGRAM N/A 01/10/2012   Procedure: LEFT HEART CATHETERIZATION WITH CORONARY ANGIOGRAM;  Surgeon: Peter M Martinique, MD;  Location: [facility] CATH LAB;  Service: Cardiovascular;  Laterality: N/A;  . LEFT HEART CATHETERIZATION WITH CORONARY ANGIOGRAM N/A 06/08/2012   Procedure: LEFT HEART CATHETERIZATION WITH CORONARY ANGIOGRAM;  Surgeon: Burnell Blanks, MD;  Location: [facility]  CATH LAB;  Service: Cardiovascular;  Laterality: N/A;  . LEFT HEART CATHETERIZATION WITH CORONARY/GRAFT ANGIOGRAM N/A 12/10/2013   Procedure: LEFT HEART CATHETERIZATION WITH Beatrix Fetters;  Surgeon: [name], MD;  Location: [facility] CATH LAB;  Service: Cardiovascular;  Laterality: N/A;  . OVARY SURGERY     ovarian cancer  . POLYPECTOMY  03/09/2019   Procedure: POLYPECTOMY;  Surgeon: Rogene Houston, MD;  [facility];  Service: Endoscopy;;  cecal   . REVISION OF ARTERIOVENOUS GORETEX GRAFT N/A 02/24/2017   Procedure: REVISION OF ARTERIOVENOUS GORETEX GRAFT (RESECTION);  Surgeon: Katha Cabal, MD;  Location: [facility] ORS;  Service: Vascular;  Laterality: N/A;  Earney Mallet N/A 10/15/2013   Procedure: Fistulogram;  Surgeon: Serafina Mitchell, MD;  Location: [facility] CATH LAB;  Service: Cardiovascular;  Laterality: N/A;  . THROMBECTOMY / ARTERIOVENOUS GRAFT REVISION  2011   left upper arm  . TUBAL LIGATION  1980's  . UPPER EXTREMITY ANGIOGRAPHY Bilateral 12/06/2016   Procedure: Upper Extremity Angiography;   Surgeon: Katha Cabal, MD;  Location: [place] CV LAB;  Service: Cardiovascular;  Laterality: Bilateral;  . UPPER EXTREMITY INTERVENTION Left 06/06/2017   Procedure: UPPER EXTREMITY INTERVENTION;  Surgeon: Katha Cabal, MD;  Location: [place] CV LAB;  Service: Cardiovascular;  Laterality: Left;     OB History    Gravida  2   Para  2   Term      Preterm  2   AB      Living  2     SAB      TAB      Ectopic      Multiple      Live Births               Home Medications    Prior to Admission medications   Medication Sig Start Date End Date Taking? Authorizing Provider  ALPRAZolam Duanne Moron) 0.5 MG tablet Take 0.5 mg by mouth daily. 10/16/18   [provider]  amiodarone (PACERONE) 200 MG tablet Take 1 tablet (200 mg total) by mouth daily. 02/19/19   Herminio Commons, MD  aspirin EC 81 MG tablet Take 1 tablet (81 mg total) by mouth daily. Restart on 03/13/19 03/13/19   Orson Eva, MD  benzonatate (TESSALON) 100 MG capsule TAKE 1 CAPSULE BY MOUTH THREE TIMES DAILY AS NEEDED FOR COUGH 03/19/19   [provider]  carvedilol (COREG) 12.5 MG tablet Take 1 tablet (12.5 mg total) by mouth 2 (two) times daily with a meal. Additional refills per PCP 03/13/19 05/14/19  Herminio Commons, MD  cloNIDine (CATAPRES) 0.1 MG tablet Take 1 tablet (0.1 mg total) by mouth 2 (two) times daily. 02/01/19   Daune Perch, NP  diphenhydrAMINE (BENADRYL) 25 mg capsule Take 50 mg by mouth daily as needed for itching or allergies.     [provider]  fluticasone (FLONASE) 50 MCG/ACT nasal spray Place 1 spray at bedtime as needed into both nostrils for allergies.     [provider]  hyoscyamine (LEVSIN SL) 0.125 MG SL tablet Place 1 tablet (0.125 mg total) under the tongue 3 (three) times daily. 05/16/19   Rogene Houston, MD  isosorbide mononitrate (IMDUR) 120 MG 24 hr tablet Take 120 mg by mouth daily. 03/13/19   [provider]   lidocaine-prilocaine (EMLA) cream Apply 1 application topically every Monday, Wednesday, and Friday. Prior to dialysis    [provider]  loratadine (CLARITIN) 10 MG tablet Take  1 tablet (10 mg total) by mouth daily as needed for allergies. 07/14/18   Hosie Poisson, MD  metroNIDAZOLE (FLAGYL) 250 MG tablet Take 1 tablet (250 mg total) by mouth 3 (three) times daily. 05/14/19   Rogene Houston, MD  multivitamin (RENA-VIT) TABS tablet Take 1 tablet by mouth daily.    [provider]  nitroGLYCERIN (NITROSTAT) 0.4 MG SL tablet DISSOLVE ONE TABLET UNDER THE TONGUE EVERY 5 MINUTES AS NEEDED FOR CHEST PAIN.  DO NOT EXCEED A TOTAL OF 3 DOSES IN 15 MINUTES 10/08/18   Herminio Commons, MD  nystatin ointment (MYCOSTATIN) APPLY A SMALL AMOUNT TOPICALLY TWICE DAILY FOR 7 DAYS TO EXTERNAL ANAL AREA FOR ANAL ITCHINESS 03/21/19   [provider]  omeprazole (PRILOSEC) 20 MG capsule Take 1 capsule by mouth once daily 01/31/19   Rehman, Mechele Dawley, MD  ondansetron (ZOFRAN-ODT) 4 MG disintegrating tablet DISSOLVE 1 TABLET IN MOUTH TWICE DAILY AS NEEDED 03/05/19   [provider]  sevelamer carbonate (RENVELA) 800 MG tablet TAKE 3 TABLETS BY MOUTH THREE TIMES DAILY WITH MEALS AND 2 TABLETS WITH SNACKS 04/03/19   [provider]  simvastatin (ZOCOR) 20 MG tablet TAKE ONE TABLET (20 MG) BY MOUTH DAILY AT BEDTIME 04/03/18   Herminio Commons, MD  ticagrelor (BRILINTA) 60 MG TABS tablet Take 1 tablet (60 mg total) by mouth 2 (two) times daily. Restart on 03/13/19 04/15/19   Almyra Deforest, PA    Family History Family History  Problem Relation Age of Onset  . Heart disease Mother        Heart Disease before age 8  . Hyperlipidemia Mother   . Hypertension Mother   . Diabetes Mother   . Heart attack Mother   . Heart disease Father        Heart Disease before age 51  . Hyperlipidemia Father   . Hypertension Father   . Diabetes Father   . Diabetes Sister   . Hypertension Sister    . Diabetes Brother   . Hyperlipidemia Brother   . Heart attack Brother   . Hypertension Sister   . Heart attack Brother   . Other Other        noncontributory for early CAD  . Colon cancer Neg Hx   . Esophageal cancer Neg Hx   . Liver disease Neg Hx   . Kidney disease Neg Hx   . Colon polyps Neg Hx     Social History Social History   Tobacco Use  . Smoking status: Never Smoker  . Smokeless tobacco: Never Used  Substance Use Topics  . Alcohol use: No    Alcohol/week: 0.0 standard drinks  . Drug use: No     Allergies   Aspirin, Penicillins, Amlodipine, Bactrim [sulfamethoxazole-trimethoprim], Contrast media [iodinated diagnostic agents], Iron, Nitrofurantoin, Tylenol [acetaminophen], Gabapentin, Hydralazine, Dexilant [dexlansoprazole], Levaquin [levofloxacin in d5w], Morphine and related, Plavix [clopidogrel bisulfate], Protonix [pantoprazole sodium], and Venofer [ferric oxide]   Review of Systems Review of Systems  Constitutional: Negative for fever.  Respiratory: Negative for shortness of breath.   Cardiovascular: Negative for chest pain.  Gastrointestinal: Positive for blood in stool. Negative for abdominal pain, constipation, diarrhea, nausea and vomiting.  Genitourinary:       Dialysis patient, does not make urine.  Musculoskeletal: Negative for arthralgias, back pain and myalgias.  Skin: Negative for wound.  Neurological: Negative for dizziness and weakness.  Psychiatric/Behavioral: Negative for confusion.  All other systems reviewed and are negative.    Physical  Exam Updated Vital Signs BP (!) 190/66   Pulse 71   Temp 98.7 F (37.1 C) (Oral)   Resp 12   Ht 5\' 1"  (1.549 m)   Wt 69.4 kg   SpO2 99%   BMI 28.91 kg/m   Physical Exam Vitals signs and nursing note reviewed.  Constitutional:      General: She is not in acute distress.    Appearance: She is well-developed. She is not diaphoretic.  HENT:     Head: Normocephalic and atraumatic.   Cardiovascular:     Rate and Rhythm: Normal rate and regular rhythm.     Pulses: Normal pulses.     Heart sounds: Normal heart sounds.  Pulmonary:     Effort: Pulmonary effort is normal.     Breath sounds: Normal breath sounds.  Abdominal:     Palpations: Abdomen is soft.     Tenderness: There is no abdominal tenderness.  Skin:    General: Skin is warm and dry.     Findings: No erythema or rash.  Neurological:     Mental Status: She is alert and oriented to person, place, and time.  Psychiatric:        Behavior: Behavior normal.      ED Treatments / Results  Labs (all labs ordered are listed, but only abnormal results are displayed) Labs Reviewed  COMPREHENSIVE METABOLIC PANEL - Abnormal; Notable for the following components:      Result Value   Potassium 3.4 (*)    Glucose, Bld 103 (*)    Creatinine, Ser 4.20 (*)    AST 7 (*)    GFR calc non Af Amer 9 (*)    GFR calc Af Amer 11 (*)    All other components within normal limits  CBC - Abnormal; Notable for the following components:   RBC 3.13 (*)    Hemoglobin 10.4 (*)    HCT 34.0 (*)    MCV 108.6 (*)    RDW 18.4 (*)    All other components within normal limits  TYPE AND SCREEN    EKG None  Radiology No results found.  Procedures Procedures (including critical care time)  Medications Ordered in ED Medications - No data to display   Initial Impression / Assessment and Plan / ED Course  I have reviewed the triage vital signs and the nursing notes.  Pertinent labs & imaging results that were available during my care of the patient were reviewed by me and considered in my medical decision making (see chart for details).  Clinical Course as of May 16 2136  Ludwig Clarks May 16, 4389  5020 78 year old female on Brilinta presents with concern for black stools. history of GI bleed.  Patient was last seen by her GI 3 days ago, had mentioned having had a dark stool episode and was advised to go to the ER for any further  episodes.  Patient states that she had intermittent black stools for the past 2 days, none recently today.  Patient is feeling well otherwise.  Review of lab work today, patient's hemoglobin is 10.4 which is within her normal 10-11 range.  CMP without significant changes.  Offered to do rectal exam today, patient declines rectal exam and Hemoccult testing as she is not having dark stools recently today and her hemoglobin is within her normal range.  Patient will return to the emergency room for any worsening symptoms throughout the weekend otherwise will follow up with her GI next week.   [  LM]  2137 Case discussed with Dr. Reather Converse, ER attending who agrees with plan of care.   [LM]    Clinical Course User Index [LM] Tacy Learn, PA-C      Final Clinical Impressions(s) / ED Diagnoses   Final diagnoses:  History of GI bleed    ED Discharge Orders    None       Roque Lias 05/17/19 2137    Elnora Morrison, MD 05/17/19 2342

## 2019-05-17 NOTE — ED Notes (Signed)
Patient on 12 lead at this time. EKG done and seen by Dr Reather Converse

## 2019-05-17 NOTE — ED Triage Notes (Signed)
Black stools x 1 week

## 2019-05-17 NOTE — Discharge Instructions (Addendum)
Follow-up with Dr. Melony Overly.  Return to the emergency room for any worsening or concerning symptoms including shortness of breath, dizziness, weakness, increase in bloody/dark stools.

## 2019-05-19 LAB — TYPE AND SCREEN
ABO/RH(D): O POS
Antibody Screen: POSITIVE

## 2019-05-27 ENCOUNTER — Telehealth: Payer: Self-pay | Admitting: Cardiovascular Disease

## 2019-05-27 NOTE — Telephone Encounter (Signed)
Reports BP dropping low while on dialysis today. BP was 111/?. After dialysis treatment BP 149/?. Advised patient that she needed to discuss this with her dialysis team when this happens in the future. Verbalized understanding of plan.

## 2019-05-27 NOTE — Telephone Encounter (Signed)
She is currently on dialysis and her BP keeps falling

## 2019-05-29 ENCOUNTER — Telehealth: Payer: Self-pay | Admitting: Cardiovascular Disease

## 2019-05-29 NOTE — Telephone Encounter (Signed)
Reports having intermittent chest pain rated 9/10 today before dialysis. Reports taking nitroglycerin x's 1 and the chest pain improved. Reports having some chest pain during dialysis today but was not allowed to take nitroglycerin during dialysis. Reports headache with chest pain. Denies sob. Denies congestion but says she coughs some from her sinus problem. No active chest pain. Virtual appt scheduled for 06/04/2019 @4 :20 pm with Dr. Raliegh Ip. Advised if symptoms return or get worse, that she needed to go to the ED for an evaluation. Verbalized understanding of plan.

## 2019-05-29 NOTE — Telephone Encounter (Signed)
Pt c/o of Chest Pain: 1. Are you having CP right now?  Off and on  2. Are you experiencing any other symptoms (ex. SOB, nausea, vomiting, sweating)? Shortness of breath  3. How long have you been experiencing CP?  Al day 4. Is your CP continuous or coming and going? Both   5. Have you taken Nitroglycerin? 1 this am 10:00am

## 2019-06-03 ENCOUNTER — Other Ambulatory Visit: Payer: Self-pay

## 2019-06-03 ENCOUNTER — Emergency Department (HOSPITAL_COMMUNITY): Payer: Medicare HMO

## 2019-06-03 ENCOUNTER — Encounter (HOSPITAL_COMMUNITY): Payer: Self-pay | Admitting: Emergency Medicine

## 2019-06-03 ENCOUNTER — Inpatient Hospital Stay (HOSPITAL_COMMUNITY)
Admission: EM | Admit: 2019-06-03 | Discharge: 2019-06-05 | DRG: 302 | Disposition: A | Discharge status: Home / Self Care | Payer: Medicare HMO | Attending: Internal Medicine | Admitting: Internal Medicine

## 2019-06-03 DIAGNOSIS — Z888 Allergy status to other drugs, medicaments and biological substances status: Secondary | ICD-10-CM

## 2019-06-03 DIAGNOSIS — Z90710 Acquired absence of both cervix and uterus: Secondary | ICD-10-CM

## 2019-06-03 DIAGNOSIS — I209 Angina pectoris, unspecified: Secondary | ICD-10-CM

## 2019-06-03 DIAGNOSIS — D500 Iron deficiency anemia secondary to blood loss (chronic): Secondary | ICD-10-CM | POA: Diagnosis present

## 2019-06-03 DIAGNOSIS — D539 Nutritional anemia, unspecified: Secondary | ICD-10-CM | POA: Diagnosis present

## 2019-06-03 DIAGNOSIS — Z91041 Radiographic dye allergy status: Secondary | ICD-10-CM

## 2019-06-03 DIAGNOSIS — Z8673 Personal history of transient ischemic attack (TIA), and cerebral infarction without residual deficits: Secondary | ICD-10-CM

## 2019-06-03 DIAGNOSIS — K219 Gastro-esophageal reflux disease without esophagitis: Secondary | ICD-10-CM | POA: Diagnosis present

## 2019-06-03 DIAGNOSIS — R079 Chest pain, unspecified: Secondary | ICD-10-CM | POA: Diagnosis not present

## 2019-06-03 DIAGNOSIS — N186 End stage renal disease: Secondary | ICD-10-CM | POA: Diagnosis present

## 2019-06-03 DIAGNOSIS — Z8701 Personal history of pneumonia (recurrent): Secondary | ICD-10-CM

## 2019-06-03 DIAGNOSIS — F419 Anxiety disorder, unspecified: Secondary | ICD-10-CM | POA: Diagnosis not present

## 2019-06-03 DIAGNOSIS — Z955 Presence of coronary angioplasty implant and graft: Secondary | ICD-10-CM

## 2019-06-03 DIAGNOSIS — I252 Old myocardial infarction: Secondary | ICD-10-CM

## 2019-06-03 DIAGNOSIS — Z86010 Personal history of colonic polyps: Secondary | ICD-10-CM

## 2019-06-03 DIAGNOSIS — I447 Left bundle-branch block, unspecified: Secondary | ICD-10-CM | POA: Diagnosis present

## 2019-06-03 DIAGNOSIS — I480 Paroxysmal atrial fibrillation: Secondary | ICD-10-CM | POA: Diagnosis present

## 2019-06-03 DIAGNOSIS — Z8349 Family history of other endocrine, nutritional and metabolic diseases: Secondary | ICD-10-CM

## 2019-06-03 DIAGNOSIS — Z8711 Personal history of peptic ulcer disease: Secondary | ICD-10-CM

## 2019-06-03 DIAGNOSIS — K31811 Angiodysplasia of stomach and duodenum with bleeding: Secondary | ICD-10-CM | POA: Diagnosis present

## 2019-06-03 DIAGNOSIS — Z20828 Contact with and (suspected) exposure to other viral communicable diseases: Secondary | ICD-10-CM | POA: Diagnosis present

## 2019-06-03 DIAGNOSIS — I472 Ventricular tachycardia: Secondary | ICD-10-CM | POA: Diagnosis present

## 2019-06-03 DIAGNOSIS — Z992 Dependence on renal dialysis: Secondary | ICD-10-CM

## 2019-06-03 DIAGNOSIS — J42 Unspecified chronic bronchitis: Secondary | ICD-10-CM | POA: Diagnosis present

## 2019-06-03 DIAGNOSIS — Z7902 Long term (current) use of antithrombotics/antiplatelets: Secondary | ICD-10-CM

## 2019-06-03 DIAGNOSIS — Z833 Family history of diabetes mellitus: Secondary | ICD-10-CM

## 2019-06-03 DIAGNOSIS — I160 Hypertensive urgency: Secondary | ICD-10-CM | POA: Diagnosis present

## 2019-06-03 DIAGNOSIS — E8889 Other specified metabolic disorders: Secondary | ICD-10-CM | POA: Diagnosis present

## 2019-06-03 DIAGNOSIS — I25119 Atherosclerotic heart disease of native coronary artery with unspecified angina pectoris: Secondary | ICD-10-CM | POA: Diagnosis not present

## 2019-06-03 DIAGNOSIS — Z800 Family history of malignant neoplasm of digestive organs: Secondary | ICD-10-CM

## 2019-06-03 DIAGNOSIS — R0789 Other chest pain: Secondary | ICD-10-CM

## 2019-06-03 DIAGNOSIS — D631 Anemia in chronic kidney disease: Secondary | ICD-10-CM | POA: Diagnosis present

## 2019-06-03 DIAGNOSIS — Z880 Allergy status to penicillin: Secondary | ICD-10-CM

## 2019-06-03 DIAGNOSIS — Z85038 Personal history of other malignant neoplasm of large intestine: Secondary | ICD-10-CM

## 2019-06-03 DIAGNOSIS — Z885 Allergy status to narcotic agent status: Secondary | ICD-10-CM

## 2019-06-03 DIAGNOSIS — E7800 Pure hypercholesterolemia, unspecified: Secondary | ICD-10-CM | POA: Diagnosis present

## 2019-06-03 DIAGNOSIS — D649 Anemia, unspecified: Secondary | ICD-10-CM | POA: Diagnosis present

## 2019-06-03 DIAGNOSIS — N189 Chronic kidney disease, unspecified: Secondary | ICD-10-CM | POA: Diagnosis present

## 2019-06-03 DIAGNOSIS — Z951 Presence of aortocoronary bypass graft: Secondary | ICD-10-CM

## 2019-06-03 DIAGNOSIS — Z886 Allergy status to analgesic agent status: Secondary | ICD-10-CM

## 2019-06-03 DIAGNOSIS — Z8249 Family history of ischemic heart disease and other diseases of the circulatory system: Secondary | ICD-10-CM

## 2019-06-03 DIAGNOSIS — I5032 Chronic diastolic (congestive) heart failure: Secondary | ICD-10-CM | POA: Diagnosis present

## 2019-06-03 DIAGNOSIS — Z7982 Long term (current) use of aspirin: Secondary | ICD-10-CM

## 2019-06-03 DIAGNOSIS — M1990 Unspecified osteoarthritis, unspecified site: Secondary | ICD-10-CM | POA: Diagnosis present

## 2019-06-03 DIAGNOSIS — Z9114 Patient's other noncompliance with medication regimen: Secondary | ICD-10-CM

## 2019-06-03 DIAGNOSIS — E785 Hyperlipidemia, unspecified: Secondary | ICD-10-CM | POA: Diagnosis present

## 2019-06-03 DIAGNOSIS — I340 Nonrheumatic mitral (valve) insufficiency: Secondary | ICD-10-CM | POA: Diagnosis present

## 2019-06-03 DIAGNOSIS — I10 Essential (primary) hypertension: Secondary | ICD-10-CM

## 2019-06-03 DIAGNOSIS — Z8543 Personal history of malignant neoplasm of ovary: Secondary | ICD-10-CM

## 2019-06-03 DIAGNOSIS — I132 Hypertensive heart and chronic kidney disease with heart failure and with stage 5 chronic kidney disease, or end stage renal disease: Secondary | ICD-10-CM | POA: Diagnosis present

## 2019-06-03 DIAGNOSIS — I6522 Occlusion and stenosis of left carotid artery: Secondary | ICD-10-CM | POA: Diagnosis present

## 2019-06-03 LAB — BASIC METABOLIC PANEL
Anion gap: 11 (ref 5–15)
BUN: 35 mg/dL — ABNORMAL HIGH (ref 8–23)
CO2: 29 mmol/L (ref 22–32)
Calcium: 9.6 mg/dL (ref 8.9–10.3)
Chloride: 96 mmol/L — ABNORMAL LOW (ref 98–111)
Creatinine, Ser: 5.96 mg/dL — ABNORMAL HIGH (ref 0.44–1.00)
GFR calc Af Amer: 7 mL/min — ABNORMAL LOW (ref >60)
GFR calc non Af Amer: 6 mL/min — ABNORMAL LOW (ref >60)
Glucose, Bld: 89 mg/dL (ref 70–99)
Potassium: 4 mmol/L (ref 3.5–5.1)
Sodium: 136 mmol/L (ref 135–145)

## 2019-06-03 LAB — POC SARS CORONAVIRUS 2 AG -  ED: SARS Coronavirus 2 Ag: NEGATIVE

## 2019-06-03 LAB — CBC
HCT: 32.5 % — ABNORMAL LOW (ref 36.0–46.0)
Hemoglobin: 9.9 g/dL — ABNORMAL LOW (ref 12.0–15.0)
MCH: 34.5 pg — ABNORMAL HIGH (ref 26.0–34.0)
MCHC: 30.5 g/dL (ref 30.0–36.0)
MCV: 113.2 fL — ABNORMAL HIGH (ref 80.0–100.0)
Platelets: 207 10*3/uL (ref 150–400)
RBC: 2.87 MIL/uL — ABNORMAL LOW (ref 3.87–5.11)
RDW: 18.1 % — ABNORMAL HIGH (ref 11.5–15.5)
WBC: 9.1 10*3/uL (ref 4.0–10.5)
nRBC: 0 % (ref 0.0–0.2)

## 2019-06-03 LAB — TROPONIN I (HIGH SENSITIVITY)
Troponin I (High Sensitivity): 30 ng/L — ABNORMAL HIGH (ref <18)
Troponin I (High Sensitivity): 34 ng/L — ABNORMAL HIGH (ref <18)

## 2019-06-03 IMAGING — DX DG CHEST 2V
2 series · 2 of 2 positions shown · non-contrast
Comparison: [DATE]

CLINICAL DATA: Chest pain

EXAM:
CHEST - 2 VIEW

[chest pa]
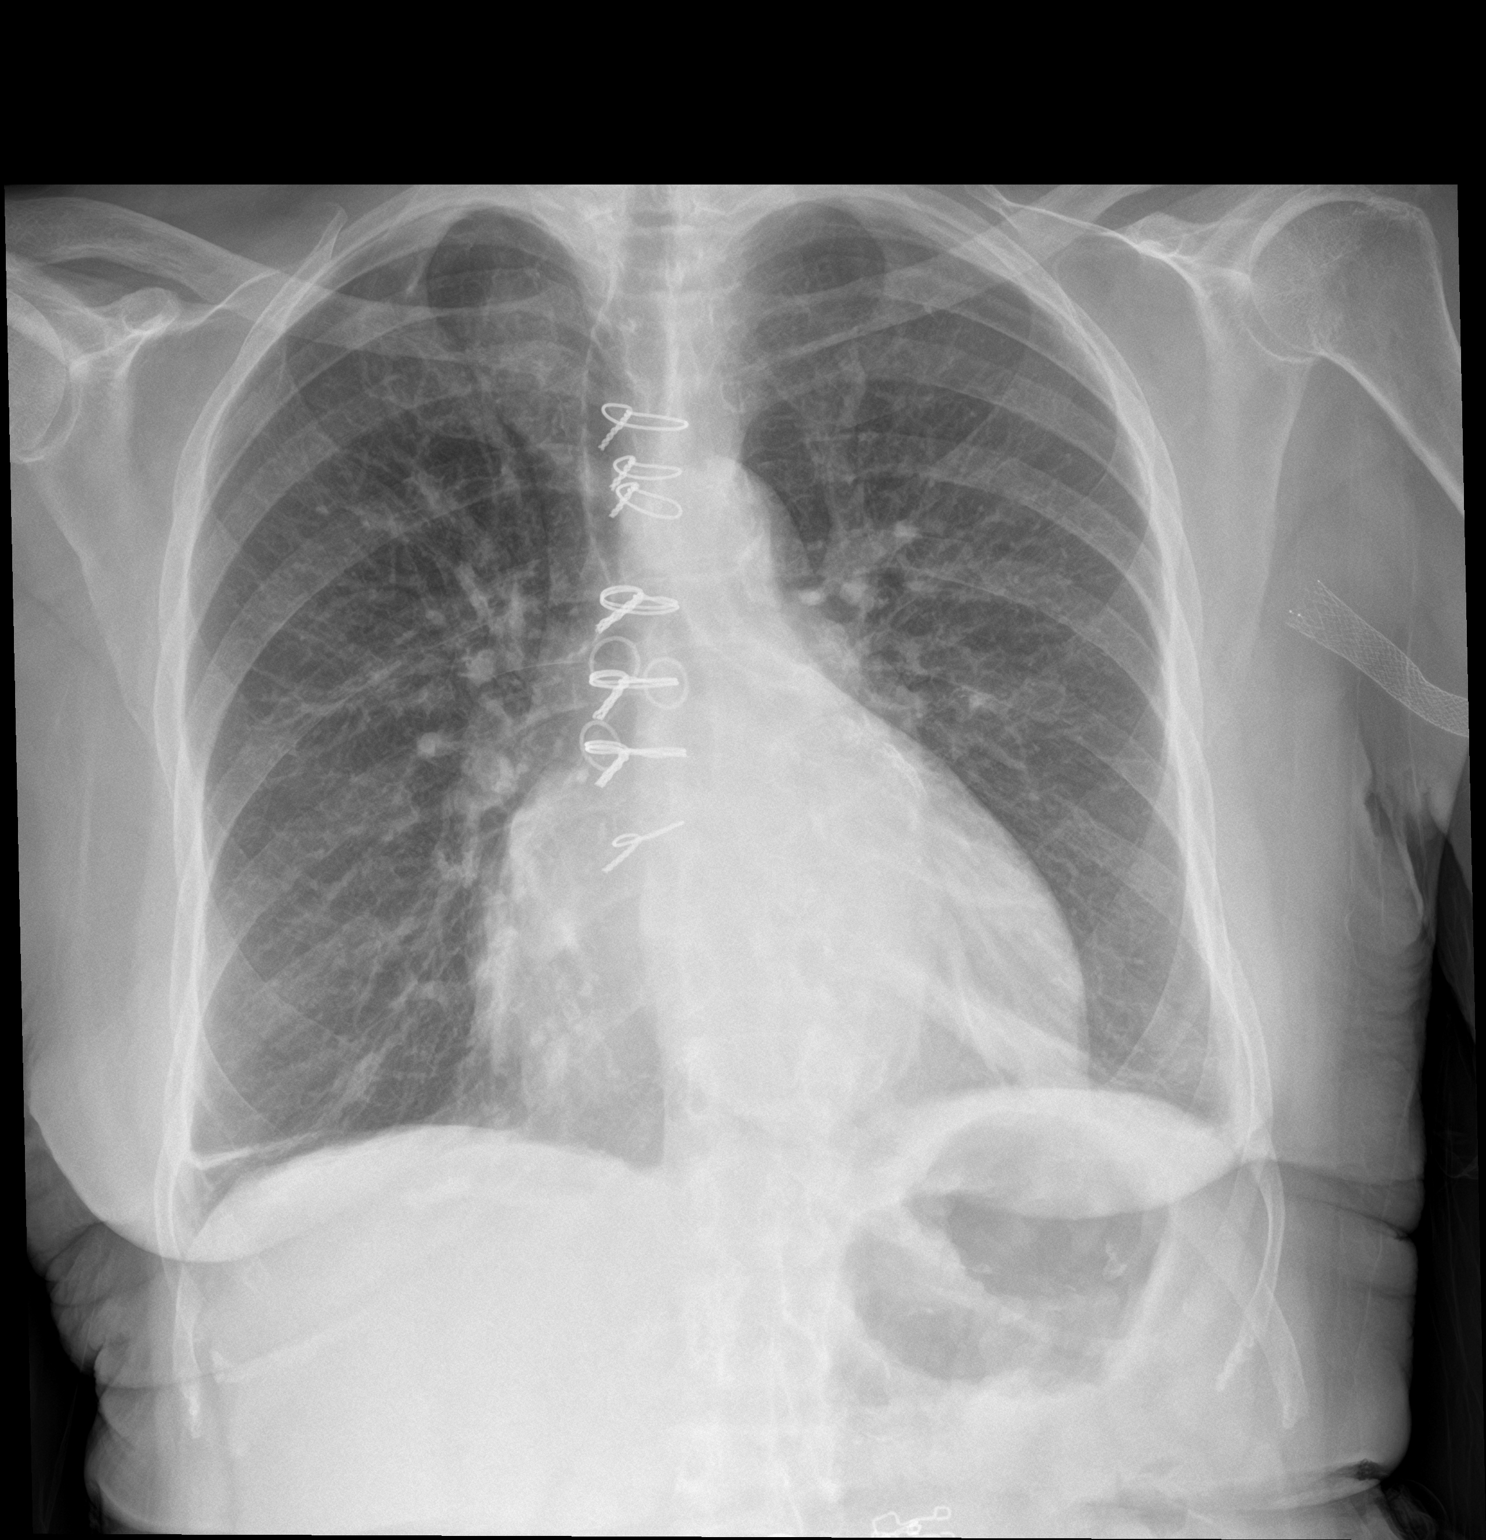

[chest lat]
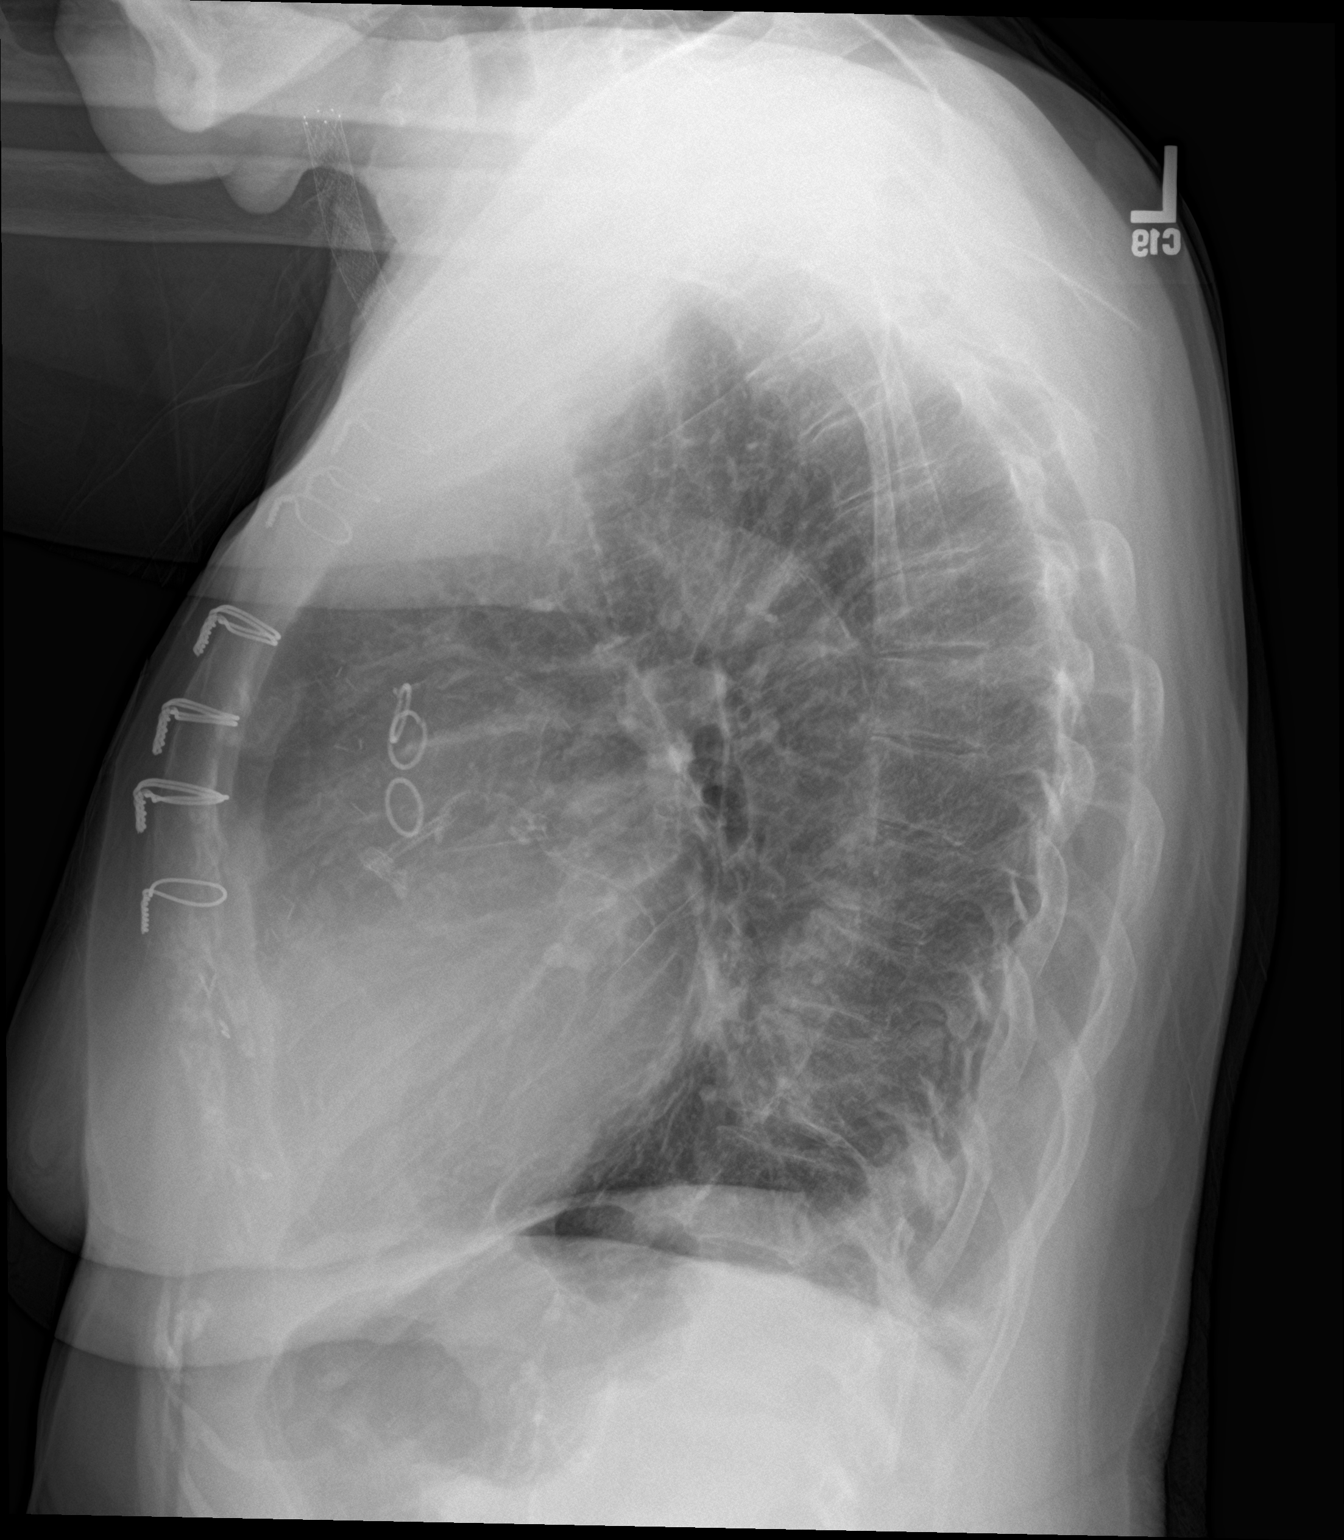

[2 of 2 positions shown; findings below may reference images not displayed]

FINDINGS: Linear scarring or atelectasis at the right lung base. Chronic
blunting of the left costophrenic angle. Mild pulmonary vascular
congestion. No new consolidation or edema. Stable cardiomediastinal
contours with mild cardiomegaly.
IMPRESSION: Mild pulmonary vascular congestion.

## 2019-06-03 MED ORDER — SIMVASTATIN 20 MG PO TABS: 20.0000 mg | ORAL_TABLET | Freq: Every day | ORAL | Status: DC

## 2019-06-03 MED ORDER — TICAGRELOR 60 MG PO TABS
60.0000 mg | ORAL_TABLET | Freq: Two times a day (BID) | ORAL | Status: DC
  Administered 2019-06-04 – 2019-06-05 (×3): 60 mg via ORAL
  Filled 2019-06-03 (×7): qty 1

## 2019-06-03 MED ORDER — HYOSCYAMINE SULFATE 0.125 MG SL SUBL
0.1250 mg | SUBLINGUAL_TABLET | Freq: Three times a day (TID) | SUBLINGUAL | Status: DC
  Administered 2019-06-04 – 2019-06-05 (×4): 0.125 mg via SUBLINGUAL
  Filled 2019-06-03 (×5): qty 1

## 2019-06-03 MED ORDER — ISOSORBIDE MONONITRATE ER 60 MG PO TB24
120.0000 mg | ORAL_TABLET | Freq: Every day | ORAL | Status: DC
  Filled 2019-06-03: qty 2

## 2019-06-03 MED ORDER — ASPIRIN EC 81 MG PO TBEC
81.0000 mg | DELAYED_RELEASE_TABLET | Freq: Every day | ORAL | Status: DC
  Administered 2019-06-04: 81 mg via ORAL
  Filled 2019-06-03: qty 1

## 2019-06-03 MED ORDER — ONDANSETRON HCL 4 MG/2ML IJ SOLN: 4.0000 mg | Freq: Four times a day (QID) | INTRAMUSCULAR | Status: DC | PRN

## 2019-06-03 MED ORDER — CARVEDILOL 12.5 MG PO TABS: 12.5000 mg | ORAL_TABLET | Freq: Once | ORAL | Status: DC

## 2019-06-03 MED ORDER — SODIUM CHLORIDE 0.9% FLUSH: 3.0000 mL | Freq: Once | INTRAVENOUS | Status: DC

## 2019-06-03 MED ORDER — FAMOTIDINE IN NACL 20-0.9 MG/50ML-% IV SOLN: 20.0000 mg | INTRAVENOUS | Status: DC

## 2019-06-03 MED ORDER — FAMOTIDINE IN NACL 20-0.9 MG/50ML-% IV SOLN: 20.0000 mg | Freq: Two times a day (BID) | INTRAVENOUS | Status: DC

## 2019-06-03 MED ORDER — NITROGLYCERIN 0.4 MG SL SUBL: 0.4000 mg | SUBLINGUAL_TABLET | SUBLINGUAL | Status: DC | PRN

## 2019-06-03 MED ORDER — CLONIDINE HCL 0.1 MG PO TABS
0.1000 mg | ORAL_TABLET | Freq: Two times a day (BID) | ORAL | Status: DC
  Administered 2019-06-04: 0.1 mg via ORAL
  Filled 2019-06-03 (×3): qty 1

## 2019-06-03 MED ORDER — CLONIDINE HCL 0.1 MG PO TABS
0.1000 mg | ORAL_TABLET | Freq: Once | ORAL | Status: AC
  Administered 2019-06-03: 0.1 mg via ORAL
  Filled 2019-06-03: qty 1

## 2019-06-03 MED ORDER — ACETAMINOPHEN 325 MG PO TABS: 650.0000 mg | ORAL_TABLET | ORAL | Status: DC | PRN

## 2019-06-03 MED ORDER — CARVEDILOL 12.5 MG PO TABS: 12.5000 mg | ORAL_TABLET | Freq: Two times a day (BID) | ORAL | Status: DC

## 2019-06-03 MED ORDER — SEVELAMER CARBONATE 800 MG PO TABS
800.0000 mg | ORAL_TABLET | Freq: Three times a day (TID) | ORAL | Status: DC
  Administered 2019-06-04: 800 mg via ORAL
  Filled 2019-06-03: qty 1

## 2019-06-03 MED ORDER — ALPRAZOLAM 0.5 MG PO TABS
0.5000 mg | ORAL_TABLET | Freq: Every day | ORAL | Status: DC
  Administered 2019-06-04 – 2019-06-05 (×2): 0.5 mg via ORAL
  Filled 2019-06-03 (×2): qty 1

## 2019-06-03 MED ORDER — AMIODARONE HCL 200 MG PO TABS
200.0000 mg | ORAL_TABLET | Freq: Every day | ORAL | Status: DC
  Administered 2019-06-04 – 2019-06-05 (×2): 200 mg via ORAL
  Filled 2019-06-03 (×2): qty 1

## 2019-06-03 MED ORDER — FAMOTIDINE 20 MG PO TABS
10.0000 mg | ORAL_TABLET | Freq: Every day | ORAL | Status: DC
  Administered 2019-06-04 – 2019-06-05 (×2): 10 mg via ORAL
  Filled 2019-06-03 (×2): qty 1

## 2019-06-03 MED ORDER — CARVEDILOL 12.5 MG PO TABS
12.5000 mg | ORAL_TABLET | Freq: Two times a day (BID) | ORAL | Status: DC
  Administered 2019-06-04 – 2019-06-05 (×2): 12.5 mg via ORAL
  Filled 2019-06-03 (×4): qty 1

## 2019-06-03 NOTE — ED Provider Notes (Signed)
[facility] EMERGENCY DEPARTMENT Provider Note   CSN: [id number] Arrival date & time: 06/03/19  1515     History   Chief Complaint Chief Complaint  Patient presents with  . Chest Pain    HPI [name] is a 78 y.o. female with PMHx CAD s/p CABG 2013, NSTEMI, ESRD on dialysis MWF, GERD, HTN, CHF with EF 60-65% (12/14/2018) who presents to the ED today complaining of sudden onset, intermittent, substernal/left sided chest pains that started last night.  Patient reports that last night she took one of her nitro which resolved her chest pain.  When she woke up this morning she had some more chest pain she took another nitro.  Patient states that while at dialysis she started complaining of chest pain again and they gave her "something for acid reflux" which took her chest pain away.  Patient states that the last episode of chest pain was around 3 PM today it lasted approximately 20 minutes before subsiding with acid reflux medication.  Patient reports that her dialysis was stopped with about 1 hour remaining due to the chest pain.  She was told to come to the ED for further evaluation.  States she currently does not have any pain.  She is denying any shortness of breath, diaphoresis, leg swelling, nausea, vomiting with the chest pain.  She does report that last week she was having similar chest pain and called her cardiologist Dr. Bronson Ing, she is scheduled for a telemedicine visit tomorrow with him.  Patient denies any other symptoms currently.  No history of DVT/PE.  Patient is currently on Brilinta and has not missed any of her doses.        Past Medical History:  Diagnosis Date  . Acute on chronic respiratory failure with hypoxia ([place]) 10/10/2016  . Anxiety   . Arthritis   . AVM (arteriovenous malformation) of colon   . CAD (coronary artery disease)    a. s/p CABG in 2013 b. most recent DES to D1 in 10/2016 with cath showing patent LIMA-LAD, SVG-RI, SVG-Mrg and SVG-RCA  c. NST in  02/2018 showing scar with no significant ischemia  . Carotid artery disease ([place])    a. 17-61% LICA, 12/735   . Chronic bronchitis ([place])   . Chronic diastolic CHF (congestive heart failure) ([place])    a. 02/2012 Echo EF 60-65%, nl wall motion, Gr 1 DD, mod MR  . Colon cancer ([place]) 1992  . Esophageal stricture   . ESRD on hemodialysis ([place])    ESRD due to HTN, started dialysis 2011 and gets HD at [facility] with Dr Hinda Lenis on MWF schedule.  Access is LUA AVF as of Sept 2014.   Marland Kitchen GERD (gastroesophageal reflux disease)   . High cholesterol 12/2011  . History of blood transfusion 07/2011; 12/2011; 01/2012 X 2; 04/2012  . History of gout   . History of lower GI bleeding   . Hypertension   . Iron deficiency anemia   . Mitral regurgitation    a. Moderate by echo, 02/2012  . Myocardial infarction ([place])   . Ovarian cancer ([place]) 1992  . Pneumonia ~ 2009  . PUD (peptic ulcer disease)   . TIA (transient ischemic attack)     Patient Active Problem List   Diagnosis Date Noted  . Rectal bleeding 05/14/2019  . Small intestinal bacterial overgrowth 05/14/2019  . Iron deficiency anemia 04/02/2019  . GI bleed 03/08/2019  . Gastrointestinal hemorrhage with melena 03/06/2019  . Acute respiratory failure with  hypoxia ([place]) 12/25/2018  . Elevated troponin 12/14/2018  . Chest pain at rest 07/13/2018  . Hand steal syndrome ([place]) 08/01/2017  . Anemia 07/14/2017  . Coronary artery disease of bypass graft of native heart with stable angina pectoris ([place]) 06/05/2017  . Mesenteric ischemia ([place])   . Diverticulitis   . SBO (small bowel obstruction) ([place]) 03/22/2017  . Enteritis   . Complication of vascular access for dialysis 03/19/2017  . Preoperative clearance 01/25/2017  . Symptomatic anemia 10/24/2016  . H/O non-ST elevation myocardial infarction (NSTEMI) 10/24/2016  . Fluid overload 10/10/2016  . Complication from renal dialysis device 10/10/2016  . Non-ST elevation MI (NSTEMI) ([place])   . Encounter  for fitting and adjustment of vascular catheter   . ESRD (end stage renal disease) ([place])   . Heme positive stool   . Demand ischemia ([place]) 07/27/2016  . Hypertensive emergency 07/08/2016  . Acute on chronic respiratory failure with hypoxia ([place])   . Cardiac arrest ([place])   . Palliative care encounter   . Goals of care, counseling/discussion   . Hypertensive crisis without congestive heart failure 05/09/2016  . Acute pulmonary edema ([place]) 04/06/2016  . Acute respiratory failure ([place]) 04/06/2016  . Hypertensive crisis 01/27/2016  . History of colon cancer 01/27/2016  . History of ovarian cancer 01/27/2016  . Hypertensive urgency 01/27/2016  . Atrial fibrillation ([place]) 10/14/2015  . Coronary angioplasty status 10/14/2015  . Malignant neoplasm of right ovary ([place]) 10/14/2015  . Narrow complex tachycardia ([place]) 09/08/2015  . SVT (supraventricular tachycardia) ([place]) 09/08/2015  . Influenza A 08/30/2015  . Acute on chronic diastolic CHF (congestive heart failure) ([place]) 05/04/2015  . Unstable angina ([place]) 05/03/2015  . Essential hypertension   . Pain in joint, lower leg 08/14/2014  . Dacryocystitis 05/29/2013  . Chronic diastolic CHF (congestive heart failure) ([place]) 03/22/2013  . GI bleeding 03/21/2013  . Acute blood loss anemia 03/21/2013  . Vaginal odor 03/12/2013  . Vaginal discharge 03/12/2013  . Occlusion and stenosis of carotid artery without mention of cerebral infarction 01/24/2013  . Hx of CABG 07/05/2012  . Carotid artery disease ([place]) 07/05/2012  . Mitral regurgitation 06/12/2012  . Pneumonia 06/09/2012  . Non-STEMI (non-ST elevated myocardial infarction) ([place]) 06/08/2012  . Ischemic chest pain ([place]) 03/01/2012  . 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 GERD (gastroesophageal reflux disease) 01/09/2012  . HLD (hyperlipidemia) 01/05/2012  . Atherosclerotic heart disease of native coronary artery without angina pectoris 12/16/2011  . ESRD on  hemodialysis ([place]) 12/16/2011  . Anxiety disorder 05/04/2011  . Anemia in chronic kidney disease 04/29/2011  . Secondary hyperparathyroidism of renal origin ([place]) 04/29/2011  . End stage renal disease ([place]) 04/29/2011  . Gout 04/29/2011  . Hypertensive chronic kidney disease with stage 5 chronic kidney disease or end stage renal disease ([place]) 04/29/2011    Past Surgical History:  Procedure Laterality Date  . A/V SHUNTOGRAM Left 03/19/2019   Procedure: A/V SHUNTOGRAM;  Surgeon: Katha Cabal, MD;  Location: [place] CV LAB;  Service: Cardiovascular;  Laterality: Left;  . ABDOMINAL HYSTERECTOMY  1992  . APPENDECTOMY  06/1990  . AV FISTULA PLACEMENT  07/2009   left upper arm  . AV FISTULA PLACEMENT Right 09/06/2016   Procedure: RIGHT FOREARM ARTERIOVENOUS (AV) GRAFT;  Surgeon: Elam Dutch, MD;  [facility];  Service: Vascular;  Laterality: Right;  . AV FISTULA PLACEMENT N/A 02/24/2017   Procedure: INSERTION OF ARTERIOVENOUS (AV) GORE-TEX GRAFT ARM (BRACHIAL AXILLARY);  Surgeon: Hortencia Pilar  G, MD;  Location: [facility] ORS;  Service: Vascular;  Laterality: N/A;  . [place] Right 09/06/2016   Procedure: REMOVAL OF Right Arm ARTERIOVENOUS GORETEX GRAFT and Vein Patch angioplasty of brachial artery;  Surgeon: Angelia Mould, MD;  Location: [place];  Service: Vascular;  Laterality: Right;  . COLON RESECTION  1992  . COLON SURGERY    . COLONOSCOPY N/A 03/09/2019   Procedure: COLONOSCOPY;  Surgeon: Rogene Houston, MD;  [facility];  Service: Endoscopy;  Laterality: N/A;  . CORONARY ANGIOPLASTY WITH STENT PLACEMENT  12/15/11   "2"  . CORONARY ANGIOPLASTY WITH STENT PLACEMENT  y/2013   "1; makes total of 3" (05/02/2012)  . CORONARY ARTERY BYPASS GRAFT  06/13/2012   Procedure: CORONARY ARTERY BYPASS GRAFTING (CABG);  Surgeon: [place], MD;  Location: [place];  Service: Open Heart Surgery;  Laterality: N/A;  cabg x four;  using left internal mammary artery, and  left leg greater saphenous vein harvested endoscopically  . CORONARY STENT INTERVENTION N/A 10/13/2016   Procedure: Coronary Stent Intervention;  Surgeon: [place] Sine, MD;  Location: [place] CV LAB;  Service: Cardiovascular;  Laterality: N/A;  . DIALYSIS/PERMA CATHETER REMOVAL N/A 04/18/2017   Procedure: DIALYSIS/PERMA CATHETER REMOVAL;  Surgeon: Katha Cabal, MD;  Location: [place] CV LAB;  Service: Cardiovascular;  Laterality: N/A;  . DILATION AND CURETTAGE OF UTERUS    . ESOPHAGOGASTRODUODENOSCOPY  01/20/2012   Procedure: ESOPHAGOGASTRODUODENOSCOPY (EGD);  Surgeon: Ladene Artist, MD,FACG;  Location: [facility] ENDOSCOPY;  Service: Endoscopy;  Laterality: N/A;  . ESOPHAGOGASTRODUODENOSCOPY N/A 03/26/2013   Procedure: ESOPHAGOGASTRODUODENOSCOPY (EGD);  Surgeon: Irene Shipper, MD;  Location: [facility] ENDOSCOPY;  Service: Endoscopy;  Laterality: N/A;  . ESOPHAGOGASTRODUODENOSCOPY N/A 04/30/2015   Procedure: ESOPHAGOGASTRODUODENOSCOPY (EGD);  Surgeon: Rogene Houston, MD;  [facility];  Service: Endoscopy;  Laterality: N/A;  1pm - moved to 10/20 @ 1:10  . ESOPHAGOGASTRODUODENOSCOPY N/A 07/29/2016   Procedure: ESOPHAGOGASTRODUODENOSCOPY (EGD);  Surgeon: Manus Gunning, MD;  Location: [place];  Service: Gastroenterology;  Laterality: N/A;  enteroscopy  . GIVENS CAPSULE STUDY N/A 03/07/2019   Procedure: GIVENS CAPSULE STUDY;  Surgeon: Rogene Houston, MD;  [facility];  Service: Endoscopy;  Laterality: N/A;  7:30  . INTRAOPERATIVE TRANSESOPHAGEAL ECHOCARDIOGRAM  06/13/2012   Procedure: INTRAOPERATIVE TRANSESOPHAGEAL ECHOCARDIOGRAM;  Surgeon: [place], MD;  Location: [place];  Service: Open Heart Surgery;  Laterality: N/A;  . IR GENERIC HISTORICAL  07/26/2016   IR FLUORO GUIDE CV LINE RIGHT 07/26/2016 Greggory Keen, MD [facility]-INTER[facility] RAD  . IR GENERIC HISTORICAL  07/26/2016   IR US GUIDE VASC ACCESS RIGHT 07/26/2016 Greggory Keen, MD [facility]  . IR GENERIC  HISTORICAL  08/02/2016   IR US GUIDE VASC ACCESS RIGHT 08/02/2016 Greggory Keen, MD [facility]  . IR GENERIC HISTORICAL  08/02/2016   IR FLUORO GUIDE CV LINE RIGHT 08/02/2016 Greggory Keen, MD [facility]  . IR RADIOLOGY PERIPHERAL GUIDED IV START  03/28/2017  . IR US GUIDE VASC ACCESS RIGHT  03/28/2017  . LEFT HEART CATH AND CORONARY ANGIOGRAPHY N/A 09/20/2016   Procedure: Left Heart Cath and Coronary Angiography;  Surgeon: [name], MD;  Location: [place] CV LAB;  Service: Cardiovascular;  Laterality: N/A;  . LEFT HEART CATH AND CORS/GRAFTS ANGIOGRAPHY N/A 10/13/2016   Procedure: Left Heart Cath and Cors/Grafts Angiography;  Surgeon: [place] Sine, MD;  Location: [place] CV LAB;  Service: Cardiovascular;  Laterality: N/A;  .  LEFT HEART CATH AND CORS/GRAFTS ANGIOGRAPHY N/A 07/13/2018   Procedure: LEFT HEART CATH AND CORS/GRAFTS ANGIOGRAPHY;  Surgeon: Martinique, Peter M, MD;  Location: [place] CV LAB;  Service: Cardiovascular;  Laterality: N/A;  . LEFT HEART CATHETERIZATION WITH CORONARY ANGIOGRAM N/A 12/15/2011   Procedure: LEFT HEART CATHETERIZATION WITH CORONARY ANGIOGRAM;  Surgeon: Burnell Blanks, MD;  Location: [facility] CATH LAB;  Service: Cardiovascular;  Laterality: N/A;  . LEFT HEART CATHETERIZATION WITH CORONARY ANGIOGRAM N/A 01/10/2012   Procedure: LEFT HEART CATHETERIZATION WITH CORONARY ANGIOGRAM;  Surgeon: Peter M Martinique, MD;  Location: [facility] CATH LAB;  Service: Cardiovascular;  Laterality: N/A;  . LEFT HEART CATHETERIZATION WITH CORONARY ANGIOGRAM N/A 06/08/2012   Procedure: LEFT HEART CATHETERIZATION WITH CORONARY ANGIOGRAM;  Surgeon: Burnell Blanks, MD;  Location: [facility] CATH LAB;  Service: Cardiovascular;  Laterality: N/A;  . LEFT HEART CATHETERIZATION WITH CORONARY/GRAFT ANGIOGRAM N/A 12/10/2013   Procedure: LEFT HEART CATHETERIZATION WITH Beatrix Fetters;  Surgeon: [name], MD;  Location: [facility] CATH LAB;  Service: Cardiovascular;  Laterality: N/A;  .  OVARY SURGERY     ovarian cancer  . POLYPECTOMY  03/09/2019   Procedure: POLYPECTOMY;  Surgeon: Rogene Houston, MD;  [facility];  Service: Endoscopy;;  cecal   . REVISION OF ARTERIOVENOUS GORETEX GRAFT N/A 02/24/2017   Procedure: REVISION OF ARTERIOVENOUS GORETEX GRAFT (RESECTION);  Surgeon: Katha Cabal, MD;  Location: [facility] ORS;  Service: Vascular;  Laterality: N/A;  Earney Mallet N/A 10/15/2013   Procedure: Fistulogram;  Surgeon: Serafina Mitchell, MD;  Location: [facility] CATH LAB;  Service: Cardiovascular;  Laterality: N/A;  . THROMBECTOMY / ARTERIOVENOUS GRAFT REVISION  2011   left upper arm  . TUBAL LIGATION  1980's  . UPPER EXTREMITY ANGIOGRAPHY Bilateral 12/06/2016   Procedure: Upper Extremity Angiography;  Surgeon: Katha Cabal, MD;  Location: [place] CV LAB;  Service: Cardiovascular;  Laterality: Bilateral;  . UPPER EXTREMITY INTERVENTION Left 06/06/2017   Procedure: UPPER EXTREMITY INTERVENTION;  Surgeon: Katha Cabal, MD;  Location: [place] CV LAB;  Service: Cardiovascular;  Laterality: Left;     OB History    Gravida  2   Para  2   Term      Preterm  2   AB      Living  2     SAB      TAB      Ectopic      Multiple      Live Births               Home Medications    Prior to Admission medications   Medication Sig Start Date End Date Taking? Authorizing Provider  ALPRAZolam Duanne Moron) 0.5 MG tablet Take 0.5 mg by mouth daily. 10/16/18   [provider]  amiodarone (PACERONE) 200 MG tablet Take 1 tablet (200 mg total) by mouth daily. 02/19/19   Herminio Commons, MD  aspirin EC 81 MG tablet Take 1 tablet (81 mg total) by mouth daily. Restart on 03/13/19 03/13/19   Orson Eva, MD  benzonatate (TESSALON) 100 MG capsule TAKE 1 CAPSULE BY MOUTH THREE TIMES DAILY AS NEEDED FOR COUGH 03/19/19   [provider]  carvedilol (COREG) 12.5 MG tablet Take 1 tablet (12.5 mg total) by mouth 2 (two) times daily with a meal.  Additional refills per PCP 03/13/19 05/14/19  Herminio Commons, MD  cloNIDine (CATAPRES) 0.1 MG tablet Take 1 tablet (0.1 mg total) by mouth 2 (two) times daily.  02/01/19   Daune Perch, NP  diphenhydrAMINE (BENADRYL) 25 mg capsule Take 50 mg by mouth daily as needed for itching or allergies.     [provider]  fluticasone (FLONASE) 50 MCG/ACT nasal spray Place 1 spray at bedtime as needed into both nostrils for allergies.     [provider]  hyoscyamine (LEVSIN SL) 0.125 MG SL tablet Place 1 tablet (0.125 mg total) under the tongue 3 (three) times daily. 05/16/19   Rogene Houston, MD  isosorbide mononitrate (IMDUR) 120 MG 24 hr tablet Take 120 mg by mouth daily. 03/13/19   [provider]  lidocaine-prilocaine (EMLA) cream Apply 1 application topically every Monday, Wednesday, and Friday. Prior to dialysis    [provider]  loratadine (CLARITIN) 10 MG tablet Take 1 tablet (10 mg total) by mouth daily as needed for allergies. 07/14/18   Hosie Poisson, MD  metroNIDAZOLE (FLAGYL) 250 MG tablet Take 1 tablet (250 mg total) by mouth 3 (three) times daily. 05/14/19   Rogene Houston, MD  multivitamin (RENA-VIT) TABS tablet Take 1 tablet by mouth daily.    [provider]  nitroGLYCERIN (NITROSTAT) 0.4 MG SL tablet DISSOLVE ONE TABLET UNDER THE TONGUE EVERY 5 MINUTES AS NEEDED FOR CHEST PAIN.  DO NOT EXCEED A TOTAL OF 3 DOSES IN 15 MINUTES 10/08/18   Herminio Commons, MD  nystatin ointment (MYCOSTATIN) APPLY A SMALL AMOUNT TOPICALLY TWICE DAILY FOR 7 DAYS TO EXTERNAL ANAL AREA FOR ANAL ITCHINESS 03/21/19   [provider]  omeprazole (PRILOSEC) 20 MG capsule Take 1 capsule by mouth once daily 01/31/19   Rehman, Mechele Dawley, MD  ondansetron (ZOFRAN-ODT) 4 MG disintegrating tablet DISSOLVE 1 TABLET IN MOUTH TWICE DAILY AS NEEDED 03/05/19   [provider]  sevelamer carbonate (RENVELA) 800 MG tablet TAKE 3 TABLETS BY MOUTH THREE TIMES DAILY WITH  MEALS AND 2 TABLETS WITH SNACKS 04/03/19   [provider]  simvastatin (ZOCOR) 20 MG tablet TAKE ONE TABLET (20 MG) BY MOUTH DAILY AT BEDTIME 04/03/18   Herminio Commons, MD  ticagrelor (BRILINTA) 60 MG TABS tablet Take 1 tablet (60 mg total) by mouth 2 (two) times daily. Restart on 03/13/19 04/15/19   Almyra Deforest, PA    Family History Family History  Problem Relation Age of Onset  . Heart disease Mother        Heart Disease before age 59  . Hyperlipidemia Mother   . Hypertension Mother   . Diabetes Mother   . Heart attack Mother   . Heart disease Father        Heart Disease before age 79  . Hyperlipidemia Father   . Hypertension Father   . Diabetes Father   . Diabetes Sister   . Hypertension Sister   . Diabetes Brother   . Hyperlipidemia Brother   . Heart attack Brother   . Hypertension Sister   . Heart attack Brother   . Other Other        noncontributory for early CAD  . Colon cancer Neg Hx   . Esophageal cancer Neg Hx   . Liver disease Neg Hx   . Kidney disease Neg Hx   . Colon polyps Neg Hx     Social History Social History   Tobacco Use  . Smoking status: Never Smoker  . Smokeless tobacco: Never Used  Substance Use Topics  . Alcohol use: No    Alcohol/week: 0.0 standard drinks  . Drug use: No  Allergies   Aspirin, Penicillins, Amlodipine, Bactrim [sulfamethoxazole-trimethoprim], Contrast media [iodinated diagnostic agents], Iron, Nitrofurantoin, Tylenol [acetaminophen], Gabapentin, Hydralazine, Dexilant [dexlansoprazole], Levaquin [levofloxacin in d5w], Morphine and related, Plavix [clopidogrel bisulfate], Protonix [pantoprazole sodium], and Venofer [ferric oxide]   Review of Systems Review of Systems  Constitutional: Negative for chills and fever.  HENT: Negative for congestion.   Eyes: Negative for visual disturbance.  Respiratory: Negative for cough and shortness of breath.   Cardiovascular: Positive for chest pain. Negative for  palpitations and leg swelling.  Gastrointestinal: Negative for abdominal pain, nausea and vomiting.  Genitourinary: Negative for flank pain.  Musculoskeletal: Negative for back pain and myalgias.  Skin: Negative for rash.  Neurological: Negative for headaches.     Physical Exam Updated Vital Signs BP (!) 175/57 (BP Location: Right Arm)   Pulse 68   Temp 98.3 F (36.8 C) (Oral)   Resp 20   Ht 5\' 1"  (1.549 m)   Wt 61.2 kg   SpO2 100%   BMI 25.51 kg/m   Physical Exam Vitals signs and nursing note reviewed.  Constitutional:      Appearance: She is not ill-appearing or diaphoretic.  HENT:     Head: Normocephalic and atraumatic.  Eyes:     Conjunctiva/sclera: Conjunctivae normal.  Neck:     Musculoskeletal: Neck supple.  Cardiovascular:     Rate and Rhythm: Normal rate and regular rhythm.     Pulses:          Radial pulses are 2+ on the right side and 2+ on the left side.       Dorsalis pedis pulses are 2+ on the right side and 2+ on the left side.  Pulmonary:     Effort: Pulmonary effort is normal.     Breath sounds: Normal breath sounds. No decreased breath sounds, wheezing, rhonchi or rales.  Chest:     Chest wall: Tenderness present.     Comments: Sternotomy surgical scar; diffuse TTP to substernal area as well as left chest wall Abdominal:     Palpations: Abdomen is soft.     Tenderness: There is no abdominal tenderness.  Skin:    General: Skin is warm and dry.  Neurological:     Mental Status: She is alert.      ED Treatments / Results  Labs (all labs ordered are listed, but only abnormal results are displayed) Labs Reviewed  BASIC METABOLIC PANEL - Abnormal; Notable for the following components:      Result Value   Chloride 96 (*)    BUN 35 (*)    Creatinine, Ser 5.96 (*)    GFR calc non Af Amer 6 (*)    GFR calc Af Amer 7 (*)    All other components within normal limits  CBC - Abnormal; Notable for the following components:   RBC 2.87 (*)     Hemoglobin 9.9 (*)    HCT 32.5 (*)    MCV 113.2 (*)    MCH 34.5 (*)    RDW 18.1 (*)    All other components within normal limits  TROPONIN I (HIGH SENSITIVITY) - Abnormal; Notable for the following components:   Troponin I (High Sensitivity) 30 (*)    All other components within normal limits  TROPONIN I (HIGH SENSITIVITY) - Abnormal; Notable for the following components:   Troponin I (High Sensitivity) 34 (*)    All other components within normal limits  SARS CORONAVIRUS 2 (TAT 6-24 HRS)  POC SARS CORONAVIRUS 2 AG -  ED    EKG EKG Interpretation  Date/Time:  Monday June 03 2019 15:37:52 EST Ventricular Rate:  73 PR Interval:  168 QRS Duration: 110 QT Interval:  452 QTC Calculation: 497 R Axis:   86 Text Interpretation: Normal sinus rhythm Incomplete left bundle branch block Left ventricular hypertrophy with repolarization abnormality Prolonged QT Abnormal ECG Confirmed by Milton Ferguson [phone]) on 06/03/2019 5:06:27 PM   Radiology Dg Chest 2 View  Result Date: 06/03/2019 CLINICAL DATA:  Chest pain EXAM: CHEST - 2 VIEW COMPARISON:  March 08, 2019 FINDINGS: Linear scarring or atelectasis at the right lung base. Chronic blunting of the left costophrenic angle. Mild pulmonary vascular congestion. No new consolidation or edema. Stable cardiomediastinal contours with mild cardiomegaly. IMPRESSION: Mild pulmonary vascular congestion. Electronically Signed   By: Macy Mis M.D.   On: 06/03/2019 16:05    Procedures Procedures (including critical care time)  Medications Ordered in ED Medications  sodium chloride flush (NS) 0.9 % injection 3 mL (has no administration in time range)  carvedilol (COREG) tablet 12.5 mg (has no administration in time range)  cloNIDine (CATAPRES) tablet 0.1 mg (0.1 mg Oral Given 06/03/19 2026)     Initial Impression / Assessment and Plan / ED Course  I have reviewed the triage vital signs and the nursing notes.  Pertinent labs & imaging  results that were available during my care of the patient were reviewed by me and considered in my medical decision making (see chart for details).  78 year old female presents to the ED today with complaint of intermittent chest pain that started last night, most recently today around 3 PM while at dialysis.  Was sent here for further evaluation.  Patient reports she had about 1 hour left of her dialysis.  Not currently in any pain.  Has appointment scheduled with Dr. Bronson Ing tomorrow.  Will obtain EKG, chest x-ray, repeat troponins. Does appear that patient's pain has been resolved with nitroglycerin yesterday and this morning. Given pt's extensive cardiac history she may need to be admitted for further work up of chest pain.   X-ray with mild pulmonary congestion.  EKG without ischemic changes, unchanged from previous.  Hemoglobin 9.9.  Appears stable from baseline.  Creatinine elevated at 5.96 although potassium within normal limits.  Initial troponin 30, will repeat.   Repeat troponin 34. Heart score of 6 - recommend observation admission at this time. Feel that this would be appropriate for patient; I believe she only has a telemedicine visit with Dr. Bronson Ing tomorrow. Will consult hospitalist for admission.   Discussed case with hospitalist Dr. Myna Hidalgo; recommends touching base with cardiology for further reccs given she has an appointment with them tomorrow.   Clinical Course as of Jun 02 2108  Mon Jun 03, 2019  2101 Discussed case with Dr. Paticia Stack with cardiology; recommends medicine admission for chest pain workup and blood pressure control   [MV]  2101 Discussed case with Dr. Myna Hidalgo who agrees to accept for admission   [MV]    Clinical Course User Index [MV] Eustaquio Maize, PA-C   This note was prepared using Dragon voice recognition software and may include unintentional dictation errors due to the inherent limitations of voice recognition software.        Final Clinical  Impressions(s) / ED Diagnoses   Final diagnoses:  Essential hypertension  Atypical chest pain    ED Discharge Orders    None       Eustaquio Maize, PA-C 06/03/19 2109    [place],  Broadus John, MD 06/03/19 2117

## 2019-06-03 NOTE — Progress Notes (Signed)
Pharmacy Renal Dosing Adjustment  Drug: famotidine Original dose: famotidine 20 mg q12h SCr:  5.69 CrClest~ 75ml/min (HD patient) New dose:  famotidine 20 mg q24h     Thank You, Despina Pole, Pharm. D. Clinical Pharmacist 06/03/2019 9:45 PM

## 2019-06-03 NOTE — ED Notes (Signed)
Pt given meal tray; Dr. Myna Hidalgo in with pt at this time

## 2019-06-03 NOTE — ED Triage Notes (Signed)
Patient complaining of chest pain starting this morning. States she was at dialysis today and they cut her dialysis short due to headache and complaint of chest pain. Patient states dialysis did not make her chest pain worsen today.

## 2019-06-03 NOTE — Progress Notes (Signed)
Patient admitted to IV floor with no access. MD aware of no access down in Emergency Room.  Attempted to try to gain IV access.  Patient refused IV sticks at this time, stating she had been "stuck enough" and did not want to go through that again at this time. Patient otherwise stable.  Will continue to monitor patient.

## 2019-06-03 NOTE — ED Notes (Signed)
Report given to Bree RN 

## 2019-06-03 NOTE — ED Notes (Signed)
[facility] paged to [phone] at this time.

## 2019-06-03 NOTE — H&P (Signed)
History and Physical    Shawna [name] DGL:[id number] DOB: [birth year] DOA: 06/03/2019  PCP: [name], MD   Patient coming from: home   Chief Complaint: Chest pain  HPI: Shawna [name] is a 78 y.o. female with medical history significant for ESRD on hemodialysis, coronary artery disease status post CABG and stents, anxiety, GI bleeding, GERD, and hypertension, now presenting to emergency department from her dialysis center for evaluation of chest pain.  Patient follows with cardiology and is medically managed for her coronary artery disease, had been experiencing episodes of chest discomfort on occasion for the past week or so, developed the same symptoms at dialysis today, dialysis was stopped, and she was directed to the ED.  She reports that the discomfort resolved prior to arrival in the emergency department and she attributes this to a medication that she was given at the dialysis center which she believes was for acid reflux.  She reports that her blood pressure has been labile, low 518A systolic at times, but also getting down to the low 100s, mainly when she is being dialyzed.  The chest discomfort has, both at rest and with activity and she is unable to identify any alleviating or exacerbating factors.  She reports intermittent dark stool for which she has been following with GI.  She denies any fevers, chills, cough, or shortness of breath.  Patient reports that she has cardiology visit scheduled for tomorrow morning.  ED Course: Upon arrival to the ED, patient is found to be afebrile, saturating low 90s on room air, and hypertensive to 209/77.  EKG features a sinus rhythm with incomplete LBBB, LVH with repolarization abnormality, and QTc interval 497 ms.  Chest x-ray features mild pulmonary vascular congestion.  Chemistry panel notable for normal potassium, normal bicarbonate, and BUN of 35.  CBC features a macrocytic anemia with hemoglobin 9.9, down from 10.4 early this month.  Troponin is  mildly elevated and flat.  Patient was treated with her home dose clonidine and Coreg in the emergency department.  COVID-19 screening test has not yet resulted.  ED physician discussed the case with cardiology who recommended observation for blood pressure control.  Review of Systems:  All other systems reviewed and apart from HPI, are negative.  Past Medical History:  Diagnosis Date   Acute on chronic respiratory failure with hypoxia ([place]) 10/10/2016   Anxiety    Arthritis    AVM (arteriovenous malformation) of colon    CAD (coronary artery disease)    a. s/p CABG in 2013 b. most recent DES to D1 in 10/2016 with cath showing patent LIMA-LAD, SVG-RI, SVG-Mrg and SVG-RCA  c. NST in 02/2018 showing scar with no significant ischemia   Carotid artery disease (HCC)    a. 41-66% LICA, 0/6301    Chronic bronchitis (HCC)    Chronic diastolic CHF (congestive heart failure) ([place])    a. 02/2012 Echo EF 60-65%, nl wall motion, Gr 1 DD, mod MR   Colon cancer ([place]) 1992   Esophageal stricture    ESRD on hemodialysis ([place])    ESRD due to HTN, started dialysis 2011 and gets HD at [facility] with Dr Hinda Lenis on MWF schedule.  Access is LUA AVF as of Sept 2014.    GERD (gastroesophageal reflux disease)    High cholesterol 12/2011   History of blood transfusion 07/2011; 12/2011; 01/2012 X 2; 04/2012   History of gout    History of lower GI bleeding    Hypertension  Iron deficiency anemia    Mitral regurgitation    a. Moderate by echo, 02/2012   Myocardial infarction ([place])    Ovarian cancer ([place]) 1992   Pneumonia ~ 2009   PUD (peptic ulcer disease)    TIA (transient ischemic attack)     Past Surgical History:  Procedure Laterality Date   A/V SHUNTOGRAM Left 03/19/2019   Procedure: A/V SHUNTOGRAM;  Surgeon: Katha Cabal, MD;  Location: [place] [facility];  Service: Cardiovascular;  Laterality: Left;   ABDOMINAL HYSTERECTOMY  1992   APPENDECTOMY  06/1990   AV  FISTULA PLACEMENT  07/2009   left upper arm   AV FISTULA PLACEMENT Right 09/06/2016   Procedure: RIGHT FOREARM ARTERIOVENOUS (AV) GRAFT;  Surgeon: Elam Dutch, MD;  Location: [place];  Service: Vascular;  Laterality: Right;   AV FISTULA PLACEMENT N/A 02/24/2017   Procedure: INSERTION OF ARTERIOVENOUS (AV) GORE-TEX GRAFT ARM (BRACHIAL AXILLARY);  Surgeon: Katha Cabal, MD;  Location: [facility] OR[facility];  Service: Vascular;  Laterality: N/A;   [place] Right 09/06/2016   Procedure: REMOVAL OF Right Arm ARTERIOVENOUS GORETEX GRAFT and Vein Patch angioplasty of brachial artery;  Surgeon: Angelia Mould, MD;  Location: [place];  Service: Vascular;  Laterality: Right;   COLON RESECTION  1992   COLON SURGERY     COLONOSCOPY N/A 03/09/2019   Procedure: COLONOSCOPY;  Surgeon: Rogene Houston, MD;  [facility];  Service: Endoscopy;  Laterality: N/A;   CORONARY ANGIOPLASTY WITH STENT PLACEMENT  12/15/11   "2"   CORONARY ANGIOPLASTY WITH STENT PLACEMENT  y/2013   "1; makes total of 3" (05/02/2012)   CORONARY ARTERY BYPASS GRAFT  06/13/2012   Procedure: CORONARY ARTERY BYPASS GRAFTING (CABG);  Surgeon: Grace Isaac, MD;  Location: [place];  Service: Open Heart Surgery;  Laterality: N/A;  cabg x four;  using left internal mammary artery, and left leg greater saphenous vein harvested endoscopically   CORONARY STENT INTERVENTION N/A 10/13/2016   Procedure: Coronary Stent Intervention;  Surgeon: Troy Sine, MD;  Location: [place] CV LAB;  Service: Cardiovascular;  Laterality: N/A;   DIALYSIS/PERMA CATHETER REMOVAL N/A 04/18/2017   Procedure: DIALYSIS/PERMA CATHETER REMOVAL;  Surgeon: Katha Cabal, MD;  Location: [place] [facility];  Service: Cardiovascular;  Laterality: N/A;   DILATION AND CURETTAGE OF UTERUS     ESOPHAGOGASTRODUODENOSCOPY  01/20/2012   Procedure: ESOPHAGOGASTRODUODENOSCOPY (EGD);  Surgeon: Ladene Artist, MD,FACG;  Location: [facility] ENDOSCOPY;  Service:  Endoscopy;  Laterality: N/A;   ESOPHAGOGASTRODUODENOSCOPY N/A 03/26/2013   Procedure: ESOPHAGOGASTRODUODENOSCOPY (EGD);  Surgeon: Irene Shipper, MD;  Location[facility]COPY;  Service: Endoscopy;  Laterality: N/A;   ESOPHAGOGASTRODUODENOSCOPY N/A 04/30/2015   Procedure: ESOPHAGOGASTRODUODENOSCOPY (EGD);  Surgeon: Rogene Houston, MD;  [facility];  Service: Endoscopy;  Laterality: N/A;  1pm - moved to 10/20 @ 1:10   ESOPHAGOGASTRODUODENOSCOPY N/A 07/29/2016   Procedure: ESOPHAGOGASTRODUODENOSCOPY (EGD);  Surgeon: Manus Gunning, MD;  Location: [place];  Service: Gastroenterology;  Laterality: N/A;  enteroscopy   GIVENS CAPSULE STUDY N/A 03/07/2019   Procedure: GIVENS CAPSULE STUDY;  Surgeon: Rogene Houston, MD;  [facility];  Service: Endoscopy;  Laterality: N/A;  7:30   INTRAOPERATIVE TRANSESOPHAGEAL ECHOCARDIOGRAM  06/13/2012   Procedure: INTRAOPERATIVE TRANSESOPHAGEAL ECHOCARDIOGRAM;  Surgeon: Grace Isaac, MD;  Location: [place];  Service: Open Heart Surgery;  Laterality: N/A;   IR GENERIC HISTORICAL  07/26/2016   IR FLUORO GUIDE CV LINE RIGHT 07/26/2016  Greggory Keen, MD [facility]   IR GENERIC HISTORICAL  07/26/2016   IR US GUIDE VASC ACCESS RIGHT 07/26/2016 Greggory Keen, MD [facility]   IR GENERIC HISTORICAL  08/02/2016   IR US GUIDE VASC ACCESS RIGHT 08/02/2016 Greggory Keen, MD [facility]   IR GENERIC HISTORICAL  08/02/2016   IR FLUORO GUIDE CV LINE RIGHT 08/02/2016 Greggory Keen, MD [facility]   IR RADIOLOGY PERIPHERAL GUIDED IV START  03/28/2017   IR US GUIDE VASC ACCESS RIGHT  03/28/2017   LEFT HEART CATH AND CORONARY ANGIOGRAPHY N/A 09/20/2016   Procedure: Left Heart Cath and Coronary Angiography;  Surgeon: [name], MD;  Location: [place] [facility];  Service: Cardiovascular;  Laterality: N/A;   LEFT HEART CATH AND CORS/GRAFTS ANGIOGRAPHY N/A 10/13/2016   Procedure: Left Heart Cath and Cors/Grafts Angiography;  Surgeon:  Troy Sine, MD;  Location: [place] CV LAB;  Service: Cardiovascular;  Laterality: N/A;   LEFT HEART CATH AND CORS/GRAFTS ANGIOGRAPHY N/A 07/13/2018   Procedure: LEFT HEART CATH AND CORS/GRAFTS ANGIOGRAPHY;  Surgeon: Martinique, Peter M, MD;  Location: [place] [facility];  Service: Cardiovascular;  Laterality: N/A;   LEFT HEART CATHETERIZATION WITH CORONARY ANGIOGRAM N/A 12/15/2011   Procedure: LEFT HEART CATHETERIZATION WITH CORONARY ANGIOGRAM;  Surgeon: Burnell Blanks, MD;  Location: [facility] CATH LAB;  Service: Cardiovascular;  Laterality: N/A;   LEFT HEART CATHETERIZATION WITH CORONARY ANGIOGRAM N/A 01/10/2012   Procedure: LEFT HEART CATHETERIZATION WITH CORONARY ANGIOGRAM;  Surgeon: Peter M Martinique, MD;  Location: [facility] CATH LAB;  Service: Cardiovascular;  Laterality: N/A;   LEFT HEART CATHETERIZATION WITH CORONARY ANGIOGRAM N/A 06/08/2012   Procedure: LEFT HEART CATHETERIZATION WITH CORONARY ANGIOGRAM;  Surgeon: Burnell Blanks, MD;  Location: [facility] CATH LAB;  Service: Cardiovascular;  Laterality: N/A;   LEFT HEART CATHETERIZATION WITH CORONARY/GRAFT ANGIOGRAM N/A 12/10/2013   Procedure: LEFT HEART CATHETERIZATION WITH Beatrix Fetters;  Surgeon: [name], MD;  Location: [facility] CATH LAB;  Service: Cardiovascular;  Laterality: N/A;   OVARY SURGERY     ovarian cancer   POLYPECTOMY  03/09/2019   Procedure: POLYPECTOMY;  Surgeon: Rogene Houston, MD;  [facility];  Service: Endoscopy;;  cecal    REVISION OF ARTERIOVENOUS GORETEX GRAFT N/A 02/24/2017   Procedure: REVISION OF ARTERIOVENOUS GORETEX GRAFT (RESECTION);  Surgeon: Katha Cabal, MD;  Location: [facility] OR[facility];  Service: Vascular;  Laterality: N/A;   SHUNTOGRAM N/A 10/15/2013   Procedure: Fistulogram;  Surgeon: Serafina Mitchell, MD;  Location: [facility] CATH LAB;  Service: Cardiovascular;  Laterality: N/A;   THROMBECTOMY / ARTERIOVENOUS GRAFT REVISION  2011   left upper arm   TUBAL LIGATION  1980's   UPPER  EXTREMITY ANGIOGRAPHY Bilateral 12/06/2016   Procedure: Upper Extremity Angiography;  Surgeon: Katha Cabal, MD;  Location: [place] [facility];  Service: Cardiovascular;  Laterality: Bilateral;   UPPER EXTREMITY INTERVENTION Left 06/06/2017   Procedure: UPPER EXTREMITY INTERVENTION;  Surgeon: Katha Cabal, MD;  Location: [place] CV LAB;  Service: Cardiovascular;  Laterality: Left;     reports that she has never smoked. She has never used smokeless tobacco. She reports that she does not drink alcohol or use drugs.  Allergies  Allergen Reactions   Aspirin Other (See Comments)    High Doses Mess up her stomach; "makes my bowels have blood in them". Takes 81 mg EC Aspirin    Penicillins Other (See Comments)    SYNCOPE? , "makes me real weak when I  take it; like I'll pass out"  Has patient had a PCN reaction causing immediate rash, facial/tongue/throat swelling, SOB or lightheadedness with hypotension: Yes Has patient had a PCN reaction causing severe rash involving mucus membranes or skin necrosis: no Has patient had a PCN reaction that required hospitalization no Has patient had a PCN reaction occurring within the last 10 years: no If all of the above   Amlodipine Swelling   Bactrim [Sulfamethoxazole-Trimethoprim] Rash   Contrast Media [Iodinated Diagnostic Agents] Itching   Iron Itching and Other (See Comments)    "they gave me iron in dialysis; had to give me Benadryl cause I had to have the iron" (05/02/2012)   Nitrofurantoin Hives   Tylenol [Acetaminophen] Itching and Other (See Comments)    Makes her feet on fire per pt   Gabapentin Other (See Comments)    Unknown reaction   Hydralazine Itching   Dexilant [Dexlansoprazole] Other (See Comments)    Upset stomach   Levaquin [Levofloxacin In D5w] Rash   Morphine And Related Itching and Other (See Comments)    Itching in feet   Plavix [Clopidogrel Bisulfate] Rash   Protonix [Pantoprazole Sodium]  Rash   Venofer [Ferric Oxide] Itching and Other (See Comments)    Patient reports using Benadryl prior to doses as [place]    Family History  Problem Relation Age of Onset   Heart disease Mother        Heart Disease before age 52   Hyperlipidemia Mother    Hypertension Mother    Diabetes Mother    Heart attack Mother    Heart disease Father        Heart Disease before age 80   Hyperlipidemia Father    Hypertension Father    Diabetes Father    Diabetes Sister    Hypertension Sister    Diabetes Brother    Hyperlipidemia Brother    Heart attack Brother    Hypertension Sister    Heart attack Brother    Other Other        noncontributory for early CAD   Colon cancer Neg Hx    Esophageal cancer Neg Hx    Liver disease Neg Hx    Kidney disease Neg Hx    Colon polyps Neg Hx      Prior to Admission medications   Medication Sig Start Date End Date Taking? Authorizing Provider  ALPRAZolam Duanne Moron) 0.5 MG tablet Take 0.5 mg by mouth daily. 10/16/18   [provider]  amiodarone (PACERONE) 200 MG tablet Take 1 tablet (200 mg total) by mouth daily. 02/19/19   Herminio Commons, MD  aspirin EC 81 MG tablet Take 1 tablet (81 mg total) by mouth daily. Restart on 03/13/19 03/13/19   Orson Eva, MD  benzonatate (TESSALON) 100 MG capsule TAKE 1 CAPSULE BY MOUTH THREE TIMES DAILY AS NEEDED FOR COUGH 03/19/19   [provider]  carvedilol (COREG) 12.5 MG tablet Take 1 tablet (12.5 mg total) by mouth 2 (two) times daily with a meal. Additional refills per PCP 03/13/19 05/14/19  Herminio Commons, MD  cloNIDine (CATAPRES) 0.1 MG tablet Take 1 tablet (0.1 mg total) by mouth 2 (two) times daily. 02/01/19   Daune Perch, NP  diphenhydrAMINE (BENADRYL) 25 mg capsule Take 50 mg by mouth daily as needed for itching or allergies.     [provider]  fluticasone (FLONASE) 50 MCG/ACT nasal spray Place 1 spray at bedtime as needed into both nostrils for  allergies.     [provider]  hyoscyamine (LEVSIN SL) 0.125 MG SL tablet Place 1 tablet (0.125 mg total) under the tongue 3 (three) times daily. 05/16/19   Rogene Houston, MD  isosorbide mononitrate (IMDUR) 120 MG 24 hr tablet Take 120 mg by mouth daily. 03/13/19   [provider]  lidocaine-prilocaine (EMLA) cream Apply 1 application topically every Monday, Wednesday, and Friday. Prior to dialysis    [provider]  loratadine (CLARITIN) 10 MG tablet Take 1 tablet (10 mg total) by mouth daily as needed for allergies. 07/14/18   Hosie Poisson, MD  metroNIDAZOLE (FLAGYL) 250 MG tablet Take 1 tablet (250 mg total) by mouth 3 (three) times daily. 05/14/19   Rogene Houston, MD  multivitamin (RENA-VIT) TABS tablet Take 1 tablet by mouth daily.    [provider]  nitroGLYCERIN (NITROSTAT) 0.4 MG SL tablet DISSOLVE ONE TABLET UNDER THE TONGUE EVERY 5 MINUTES AS NEEDED FOR CHEST PAIN.  DO NOT EXCEED A TOTAL OF 3 DOSES IN 15 MINUTES 10/08/18   Herminio Commons, MD  nystatin ointment (MYCOSTATIN) APPLY A SMALL AMOUNT TOPICALLY TWICE DAILY FOR 7 DAYS TO EXTERNAL ANAL AREA FOR ANAL ITCHINESS 03/21/19   [provider]  omeprazole (PRILOSEC) 20 MG capsule Take 1 capsule by mouth once daily 01/31/19   Rehman, Mechele Dawley, MD  ondansetron (ZOFRAN-ODT) 4 MG disintegrating tablet DISSOLVE 1 TABLET IN MOUTH TWICE DAILY AS NEEDED 03/05/19   [provider]  sevelamer carbonate (RENVELA) 800 MG tablet TAKE 3 TABLETS BY MOUTH THREE TIMES DAILY WITH MEALS AND 2 TABLETS WITH SNACKS 04/03/19   [provider]  simvastatin (ZOCOR) 20 MG tablet TAKE ONE TABLET (20 MG) BY MOUTH DAILY AT BEDTIME 04/03/18   Herminio Commons, MD  ticagrelor (BRILINTA) 60 MG TABS tablet Take 1 tablet (60 mg total) by mouth 2 (two) times daily. Restart on 03/13/19 04/15/19   Almyra Deforest, PA    Physical Exam: Vitals:   06/03/19 1525 06/03/19 1901 06/03/19 1902 06/03/19 1945  BP:  (!)  176/52  (!) 209/77  Pulse:  68 66 68  Resp:    17  Temp:      TempSrc:      SpO2:  98% 93% 100%  Weight: 61.2 kg     Height: 5\' 1"  (1.549 m)       Constitutional: NAD, calm  Eyes: PERTLA, lids and conjunctivae normal ENMT: Mucous membranes are moist. Posterior pharynx clear of any exudate or lesions.   Neck: normal, supple, no masses, no thyromegaly Respiratory: no wheezing, no crackles. Normal respiratory effort. No accessory muscle use.  Cardiovascular: S1 & S2 heard, regular rate and rhythm. Mild lower leg swelling bilaterally. Abdomen: No distension, no tenderness, soft. Bowel sounds active.  Musculoskeletal: no clubbing / cyanosis. No joint deformity upper and lower extremities.    Skin: no significant rashes, lesions, ulcers. Warm, dry, well-perfused. Neurologic: No facial asymmetry. Sensation intact. Moving all extremities.  Psychiatric: Alert and oriented to person, place, and situation. Very pleasant, cooperative.       Labs on Admission: I have personally reviewed following labs and imaging studies  CBC: Recent Labs  Lab 06/03/19 1556  WBC 9.1  HGB 9.9*  HCT 32.5*  MCV 113.2*  PLT 016   Basic Metabolic Panel: Recent Labs  Lab 06/03/19 1556  NA 136  K 4.0  CL 96*  CO2 29  GLUCOSE 89  BUN 35*  CREATININE 5.96*  CALCIUM 9.6  GFR: Estimated Creatinine Clearance: 6.4 mL/min (A) (by C-G formula based on SCr of 5.96 mg/dL (H)). Liver Function Tests: No results for input(s): AST, ALT, ALKPHOS, BILITOT, PROT, ALBUMIN in the last 168 hours. No results for input(s): LIPASE, AMYLASE in the last 168 hours. No results for input(s): AMMONIA in the last 168 hours. Coagulation Profile: No results for input(s): INR, PROTIME in the last 168 hours. Cardiac Enzymes: No results for input(s): CKTOTAL, CKMB, CKMBINDEX, TROPONINI in the last 168 hours. BNP (last 3 results) No results for input(s): PROBNP in the last 8760 hours. HbA1C: No results for input(s): HGBA1C in  the last 72 hours. CBG: No results for input(s): GLUCAP in the last 168 hours. Lipid Profile: No results for input(s): CHOL, HDL, LDLCALC, TRIG, CHOLHDL, LDLDIRECT in the last 72 hours. Thyroid Function Tests: No results for input(s): TSH, T4TOTAL, FREET4, T3FREE, THYROIDAB in the last 72 hours. Anemia Panel: No results for input(s): VITAMINB12, FOLATE, FERRITIN, TIBC, IRON, RETICCTPCT in the last 72 hours. Urine analysis:    Component Value Date/Time   COLORURINE YELLOW 12/16/2012 1919   APPEARANCEUR CLOUDY (A) 12/16/2012 1919   LABSPEC 1.009 12/16/2012 1919   PHURINE 7.5 12/16/2012 1919   GLUCOSEU NEGATIVE 12/16/2012 1919   HGBUR TRACE (A) 12/16/2012 1919   BILIRUBINUR NEGATIVE 12/16/2012 1919   KETONESUR NEGATIVE 12/16/2012 1919   PROTEINUR 100 (A) 12/16/2012 1919   UROBILINOGEN 0.2 12/16/2012 1919   NITRITE NEGATIVE 12/16/2012 1919   LEUKOCYTESUR SMALL (A) 12/16/2012 1919   Sepsis Labs: @LABRCNTIP (procalcitonin:4,lacticidven:4) )No results found for this or any previous visit (from the past 240 hour(s)).   Radiological Exams on Admission: Dg Chest 2 View  Result Date: 06/03/2019 CLINICAL DATA:  Chest pain EXAM: CHEST - 2 VIEW COMPARISON:  March 08, 2019 FINDINGS: Linear scarring or atelectasis at the right lung base. Chronic blunting of the left costophrenic angle. Mild pulmonary vascular congestion. No new consolidation or edema. Stable cardiomediastinal contours with mild cardiomegaly. IMPRESSION: Mild pulmonary vascular congestion. Electronically Signed   By: Macy Mis M.D.   On: 06/03/2019 16:05    EKG: Independently reviewed. Sinus rhythm, incomplete LBBB, LVH with repolarization abnormality, QTc 497, similar to prior.   Assessment/Plan   1. Chest discomfort; CAD  - Patient has hx of CAD followed by cardiology and has had episodes of chest discomfort for the past week, has cardiology appt scheduled for am  - She had an episode during HD that prompted her to be  sent to ED  - She denies any chest discomfort, nausea, or SOB in ED, reporting that it resolved with an antacid at the dialysis center  - EKG appears similar to prior with QT prolongation improved; HS troponin is 30, then 34 more than 2 hrs later; CXR with mild vascular congestion  - Continue cardiac monitoring, repeat EKG in am, continue ASA, Brilinta, statin, Imdur, and beta-blocker    2. ESRD  - Patient reports completing only 1/3 of her HD session on day of admission, stopped early to come to ED for chest discomfort  - There is mild hypervolemia, but no acidosis, hyperkalemia, or uremia  - SLIV, renally-dose medications, continue phosphate binder    3. Hypertensive urgency  - SBP low 200's in ED, patient reports that it has been labile  - She was just treated with her home medications (clonidine and Coreg) in ED  - She denies chest pain or headache in ED  - If remains elevated after her home medications, will start a prn  agent    4. Anxiety  - Continue Xanax   5. Macrocytic anemia  - Hgb is 9.9 on admission, down form 10.4 on 11/6  - She has had intermittent GI blood loss and is following with gastroenterology  - No active bleeding     6. Paroxysmal atrial fibrillation  - In sinus rhythm on admission  - CHADS-VASc is 35 (age x2, gender, HTN, CAD) - Not anticoagulated d/t GI bleeding  - Continue amiodarone and DAPT    PPE: Mask, face shield  DVT prophylaxis: SCD's  Code Status: Full  Family Communication: Discussed with patient  Consults called: None Admission status: Observation     Vianne Bulls, MD [facility] Pager [phone]  If 7PM-7AM, please contact night-coverage [email or web address] Password [facility]  06/03/2019, 9:25 PM

## 2019-06-03 NOTE — ED Notes (Signed)
Unable to get iv access, notified dr. Myna Hidalgo. Ok to leave out iv change iv meds to oral.

## 2019-06-04 ENCOUNTER — Telehealth: Payer: Medicare HMO | Admitting: Cardiovascular Disease

## 2019-06-04 ENCOUNTER — Encounter (HOSPITAL_COMMUNITY): Payer: Self-pay | Admitting: Gastroenterology

## 2019-06-04 ENCOUNTER — Other Ambulatory Visit: Payer: Self-pay

## 2019-06-04 DIAGNOSIS — R079 Chest pain, unspecified: Secondary | ICD-10-CM | POA: Diagnosis not present

## 2019-06-04 DIAGNOSIS — I480 Paroxysmal atrial fibrillation: Secondary | ICD-10-CM

## 2019-06-04 DIAGNOSIS — I25119 Atherosclerotic heart disease of native coronary artery with unspecified angina pectoris: Secondary | ICD-10-CM | POA: Diagnosis not present

## 2019-06-04 DIAGNOSIS — K921 Melena: Secondary | ICD-10-CM | POA: Diagnosis not present

## 2019-06-04 DIAGNOSIS — I10 Essential (primary) hypertension: Secondary | ICD-10-CM | POA: Diagnosis not present

## 2019-06-04 DIAGNOSIS — N186 End stage renal disease: Secondary | ICD-10-CM | POA: Diagnosis not present

## 2019-06-04 LAB — BASIC METABOLIC PANEL
Anion gap: 12 (ref 5–15)
BUN: 48 mg/dL — ABNORMAL HIGH (ref 8–23)
CO2: 26 mmol/L (ref 22–32)
Calcium: 9.7 mg/dL (ref 8.9–10.3)
Chloride: 97 mmol/L — ABNORMAL LOW (ref 98–111)
Creatinine, Ser: 6.97 mg/dL — ABNORMAL HIGH (ref 0.44–1.00)
GFR calc Af Amer: 6 mL/min — ABNORMAL LOW (ref >60)
GFR calc non Af Amer: 5 mL/min — ABNORMAL LOW (ref >60)
Glucose, Bld: 73 mg/dL (ref 70–99)
Potassium: 4 mmol/L (ref 3.5–5.1)
Sodium: 135 mmol/L (ref 135–145)

## 2019-06-04 LAB — CBC
HCT: 29.7 % — ABNORMAL LOW (ref 36.0–46.0)
Hemoglobin: 9.1 g/dL — ABNORMAL LOW (ref 12.0–15.0)
MCH: 34.5 pg — ABNORMAL HIGH (ref 26.0–34.0)
MCHC: 30.6 g/dL (ref 30.0–36.0)
MCV: 112.5 fL — ABNORMAL HIGH (ref 80.0–100.0)
Platelets: 177 10*3/uL (ref 150–400)
RBC: 2.64 MIL/uL — ABNORMAL LOW (ref 3.87–5.11)
RDW: 17.9 % — ABNORMAL HIGH (ref 11.5–15.5)
WBC: 4.4 10*3/uL (ref 4.0–10.5)
nRBC: 0 % (ref 0.0–0.2)

## 2019-06-04 LAB — SARS CORONAVIRUS 2 (TAT 6-24 HRS): SARS Coronavirus 2: NEGATIVE

## 2019-06-04 LAB — MRSA PCR SCREENING: MRSA by PCR: NEGATIVE

## 2019-06-04 MED ORDER — SEVELAMER CARBONATE 800 MG PO TABS
1600.0000 mg | ORAL_TABLET | ORAL | Status: DC
  Filled 2019-06-04: qty 2

## 2019-06-04 MED ORDER — ISOSORBIDE MONONITRATE ER 60 MG PO TB24
60.0000 mg | ORAL_TABLET | Freq: Two times a day (BID) | ORAL | Status: DC
  Administered 2019-06-04 (×2): 60 mg via ORAL
  Filled 2019-06-04 (×3): qty 1

## 2019-06-04 MED ORDER — SEVELAMER CARBONATE 800 MG PO TABS
2400.0000 mg | ORAL_TABLET | Freq: Three times a day (TID) | ORAL | Status: DC
  Administered 2019-06-04 (×2): 2400 mg via ORAL
  Administered 2019-06-04: 1600 mg via ORAL
  Administered 2019-06-05 (×2): 2400 mg via ORAL
  Filled 2019-06-04 (×6): qty 3

## 2019-06-04 MED ORDER — OMEPRAZOLE 20 MG PO CPDR
20.0000 mg | DELAYED_RELEASE_CAPSULE | Freq: Two times a day (BID) | ORAL | Status: DC
  Administered 2019-06-05 (×2): 20 mg via ORAL
  Filled 2019-06-04 (×5): qty 1

## 2019-06-04 MED ORDER — NON FORMULARY: 20.0000 mg | Freq: Two times a day (BID) | Status: DC

## 2019-06-04 MED ORDER — FELODIPINE ER 2.5 MG PO TB24
2.5000 mg | ORAL_TABLET | Freq: Every day | ORAL | Status: DC
  Filled 2019-06-04 (×4): qty 1

## 2019-06-04 MED ORDER — ROSUVASTATIN CALCIUM 10 MG PO TABS
10.0000 mg | ORAL_TABLET | Freq: Every day | ORAL | Status: DC
  Administered 2019-06-04: 10 mg via ORAL
  Filled 2019-06-04 (×2): qty 1

## 2019-06-04 NOTE — Consult Note (Signed)
.   Cardiology Consultation:   Patient ID: Shawna [name]; [id number]; 12-24-1940   Admit date: 06/03/2019 Date of Consult: 06/04/2019  Primary Care Provider: [name], MD Primary Cardiologist: Kate Sable, MD Primary Electrophysiologist: None   Patient Profile:   Shawna [name] is a 78 y.o. female with a history of multivessel CAD status post CABG being managed medically, carotid artery disease, ESRD on hemodialysis, hypertension, PAF/NSVT on amiodarone, previous GI bleed and hyperlipidemia who is being seen today for the evaluation of chest pain at the request of Dr. Carles Collet.  History of Present Illness:   Shawna [name] is currently admitted to the hospital with recent episodes of recurrent chest discomfort, both typical and atypical features, improved at times with nitroglycerin, but also with Tums.  She reports compliance with hemodialysis sessions, and recently no limiting hypotension.  She states that she has been taking her medications regularly.  Cardiac catheterization performed back in January of this year revealed severe multivessel native CAD with patent LIMA to the LAD, patent SVG to the ramus intermedius, patent SVG to the obtuse marginal, and patent SVG to the distal RCA.  New findings at that time included a diagonal occlusion and progressive disease in the PDA, although these were felt to be best managed medically and would require fairly complex PCI if attempted.  [name] has a number of medication allergies and intolerances, this limits optimization of antianginal therapy.  Medicines on this list include hydralazine, Plavix, and Norvasc.  High-sensitivity troponin I levels are flat in the 30s and not suggestive of ACS. ECG shows sinus rhythm with LVH and repolarization abnormalities.  Past Medical History:  Diagnosis Date   Acute on chronic respiratory failure with hypoxia ([place]) 10/10/2016   Anxiety    Arthritis    AVM (arteriovenous malformation) of colon     CAD (coronary artery disease)    a. s/p CABG in 2013 b. most recent DES to D1 in 10/2016 with cath showing patent LIMA-LAD, SVG-RI, SVG-Mrg and SVG-RCA  c. NST in 02/2018 showing scar with no significant ischemia   Carotid artery disease (HCC)    a. 54-27% LICA, 0/6237    Chronic bronchitis (HCC)    Chronic diastolic CHF (congestive heart failure) ([place])    a. 02/2012 Echo EF 60-65%, nl wall motion, Gr 1 DD, mod MR   Colon cancer ([place]) 1992   Esophageal stricture    ESRD on hemodialysis ([place])    ESRD due to HTN, started dialysis 2011 and gets HD at [facility] with Dr Hinda Lenis on MWF schedule.  Access is LUA AVF as of Sept 2014.    GERD (gastroesophageal reflux disease)    High cholesterol 12/2011   History of blood transfusion 07/2011; 12/2011; 01/2012 X 2; 04/2012   History of gout    History of lower GI bleeding    Hypertension    Iron deficiency anemia    Mitral regurgitation    a. Moderate by echo, 02/2012   Myocardial infarction ([place])    Ovarian cancer ([place]) 1992   Pneumonia ~ 2009   PUD (peptic ulcer disease)    TIA (transient ischemic attack)     Past Surgical History:  Procedure Laterality Date   A/V SHUNTOGRAM Left 03/19/2019   Procedure: A/V SHUNTOGRAM;  Surgeon: Katha Cabal, MD;  Location: [place] [facility];  Service: Cardiovascular;  Laterality: Left;   ABDOMINAL HYSTERECTOMY  1992   APPENDECTOMY  06/1990   AV FISTULA PLACEMENT  07/2009  left upper arm   AV FISTULA PLACEMENT Right 09/06/2016   Procedure: RIGHT FOREARM ARTERIOVENOUS (AV) GRAFT;  Surgeon: Elam Dutch, MD;  Location: [place];  Service: Vascular;  Laterality: Right;   AV FISTULA PLACEMENT N/A 02/24/2017   Procedure: INSERTION OF ARTERIOVENOUS (AV) GORE-TEX GRAFT ARM (BRACHIAL AXILLARY);  Surgeon: Katha Cabal, MD;  Location: [facility];  Service: Vascular;  Laterality: N/A;   [place] Right 09/06/2016   Procedure: REMOVAL OF Right Arm ARTERIOVENOUS GORETEX GRAFT and  Vein Patch angioplasty of brachial artery;  Surgeon: Angelia Mould, MD;  Location: [place];  Service: Vascular;  Laterality: Right;   COLON RESECTION  1992   COLON SURGERY     COLONOSCOPY N/A 03/09/2019   Procedure: COLONOSCOPY;  Surgeon: Rogene Houston, MD;  [facility];  Service: Endoscopy;  Laterality: N/A;   CORONARY ANGIOPLASTY WITH STENT PLACEMENT  12/15/11   "2"   CORONARY ANGIOPLASTY WITH STENT PLACEMENT  y/2013   "1; makes total of 3" (05/02/2012)   CORONARY ARTERY BYPASS GRAFT  06/13/2012   Procedure: CORONARY ARTERY BYPASS GRAFTING (CABG);  Surgeon: Grace Isaac, MD;  Location: [place];  Service: Open Heart Surgery;  Laterality: N/A;  cabg x four;  using left internal mammary artery, and left leg greater saphenous vein harvested endoscopically   CORONARY STENT INTERVENTION N/A 10/13/2016   Procedure: Coronary Stent Intervention;  Surgeon: Troy Sine, MD;  Location: [place] [facility];  Service: Cardiovascular;  Laterality: N/A;   DIALYSIS/PERMA CATHETER REMOVAL N/A 04/18/2017   Procedure: DIALYSIS/PERMA CATHETER REMOVAL;  Surgeon: Katha Cabal, MD;  Location: [place] [facility];  Service: Cardiovascular;  Laterality: N/A;   DILATION AND CURETTAGE OF UTERUS     ESOPHAGOGASTRODUODENOSCOPY  01/20/2012   Procedure: ESOPHAGOGASTRODUODENOSCOPY (EGD);  Surgeon: Ladene Artist, MD,FACG;  Location: [facility];  Service: Endoscopy;  Laterality: N/A;   ESOPHAGOGASTRODUODENOSCOPY N/A 03/26/2013   Procedure: ESOPHAGOGASTRODUODENOSCOPY (EGD);  Surgeon: Irene Shipper, MD;  Location: [facility] ENDOSCOPY;  Service: Endoscopy;  Laterality: N/A;   ESOPHAGOGASTRODUODENOSCOPY N/A 04/30/2015   Procedure: ESOPHAGOGASTRODUODENOSCOPY (EGD);  Surgeon: Rogene Houston, MD;  [facility];  Service: Endoscopy;  Laterality: N/A;  1pm - moved to 10/20 @ 1:10   ESOPHAGOGASTRODUODENOSCOPY N/A 07/29/2016   Procedure: ESOPHAGOGASTRODUODENOSCOPY (EGD);  Surgeon: Manus Gunning, MD;  Location: [place];  Service: Gastroenterology;  Laterality: N/A;  enteroscopy   GIVENS CAPSULE STUDY N/A 03/07/2019   Procedure: GIVENS CAPSULE STUDY;  Surgeon: Rogene Houston, MD;  [facility];  Service: Endoscopy;  Laterality: N/A;  7:30   INTRAOPERATIVE TRANSESOPHAGEAL ECHOCARDIOGRAM  06/13/2012   Procedure: INTRAOPERATIVE TRANSESOPHAGEAL ECHOCARDIOGRAM;  Surgeon: Grace Isaac, MD;  Location: [place];  Service: Open Heart Surgery;  Laterality: N/A;   IR GENERIC HISTORICAL  07/26/2016   IR FLUORO GUIDE CV LINE RIGHT 07/26/2016 Greggory Keen, MD [facility]   IR GENERIC HISTORICAL  07/26/2016   IR US GUIDE VASC ACCESS RIGHT 07/26/2016 Greggory Keen, MD [facility]   IR GENERIC HISTORICAL  08/02/2016   IR US GUIDE VASC ACCESS RIGHT 08/02/2016 Greggory Keen, MD [facility]   IR GENERIC HISTORICAL  08/02/2016   IR FLUORO GUIDE CV LINE RIGHT 08/02/2016 Greggory Keen, MD [facility]   IR RADIOLOGY PERIPHERAL GUIDED IV START  03/28/2017   IR US GUIDE VASC ACCESS RIGHT  03/28/2017   LEFT HEART CATH AND CORONARY ANGIOGRAPHY N/A 09/20/2016   Procedure: Left Heart Cath and Coronary  Angiography;  Surgeon: [name], MD;  Location: [place] [facility];  Service: Cardiovascular;  Laterality: N/A;   LEFT HEART CATH AND CORS/GRAFTS ANGIOGRAPHY N/A 10/13/2016   Procedure: Left Heart Cath and Cors/Grafts Angiography;  Surgeon: Troy Sine, MD;  Location: [place] [facility];  Service: Cardiovascular;  Laterality: N/A;   LEFT HEART CATH AND CORS/GRAFTS ANGIOGRAPHY N/A 07/13/2018   Procedure: LEFT HEART CATH AND CORS/GRAFTS ANGIOGRAPHY;  Surgeon: Martinique, Peter M, MD;  Location: [place] [facility];  Service: Cardiovascular;  Laterality: N/A;   LEFT HEART CATHETERIZATION WITH CORONARY ANGIOGRAM N/A 12/15/2011   Procedure: LEFT HEART CATHETERIZATION WITH CORONARY ANGIOGRAM;  Surgeon: Burnell Blanks, MD;  Location: [facility] C[facility] LAB;  Service: Cardiovascular;   Laterality: N/A;   LEFT HEART CATHETERIZATION WITH CORONARY ANGIOGRAM N/A 01/10/2012   Procedure: LEFT HEART CATHETERIZATION WITH CORONARY ANGIOGRAM;  Surgeon: Peter M Martinique, MD;  Location: [facility] CATH LAB;  Service: Cardiovascular;  Laterality: N/A;   LEFT HEART CATHETERIZATION WITH CORONARY ANGIOGRAM N/A 06/08/2012   Procedure: LEFT HEART CATHETERIZATION WITH CORONARY ANGIOGRAM;  Surgeon: Burnell Blanks, MD;  Location: [facility] CATH LAB;  Service: Cardiovascular;  Laterality: N/A;   LEFT HEART CATHETERIZATION WITH CORONARY/GRAFT ANGIOGRAM N/A 12/10/2013   Procedure: LEFT HEART CATHETERIZATION WITH Beatrix Fetters;  Surgeon: [name], MD;  Location: [facility] CATH LAB;  Service: Cardiovascular;  Laterality: N/A;   OVARY SURGERY     ovarian cancer   POLYPECTOMY  03/09/2019   Procedure: POLYPECTOMY;  Surgeon: Rogene Houston, MD;  [facility];  Service: Endoscopy;;  cecal    REVISION OF ARTERIOVENOUS GORETEX GRAFT N/A 02/24/2017   Procedure: REVISION OF ARTERIOVENOUS GORETEX GRAFT (RESECTION);  Surgeon: Katha Cabal, MD;  Location: [facility];  Service: Vascular;  Laterality: N/A;   SHUNTOGRAM N/A 10/15/2013   Procedure: Fistulogram;  Surgeon: Serafina Mitchell, MD;  Location: [facility] CATH LAB;  Service: Cardiovascular;  Laterality: N/A;   THROMBECTOMY / ARTERIOVENOUS GRAFT REVISION  2011   left upper arm   TUBAL LIGATION  1980's   UPPER EXTREMITY ANGIOGRAPHY Bilateral 12/06/2016   Procedure: Upper Extremity Angiography;  Surgeon: Katha Cabal, MD;  Location: [place] [facility];  Service: Cardiovascular;  Laterality: Bilateral;   UPPER EXTREMITY INTERVENTION Left 06/06/2017   Procedure: UPPER EXTREMITY INTERVENTION;  Surgeon: Katha Cabal, MD;  Location: [place] [facility];  Service: Cardiovascular;  Laterality: Left;     Inpatient Medications: Scheduled Meds:  ALPRAZolam  0.5 mg Oral Daily   amiodarone  200 mg Oral Daily   aspirin EC  81 mg Oral  Daily   carvedilol  12.5 mg Oral BID WC   carvedilol  12.5 mg Oral Once   cloNIDine  0.1 mg Oral BID   famotidine  10 mg Oral Daily   felodipine  2.5 mg Oral Daily   hyoscyamine  0.125 mg Sublingual TID   isosorbide mononitrate  60 mg Oral BID   sevelamer carbonate  800 mg Oral TID WC   simvastatin  20 mg Oral q1800   sodium chloride flush  3 mL Intravenous Once   ticagrelor  60 mg Oral BID    PRN Meds: nitroGLYCERIN, ondansetron (ZOFRAN) IV  Allergies:    Allergies  Allergen Reactions   Aspirin Other (See Comments)    High Doses Mess up her stomach; "makes my bowels have blood in them". Takes 81 mg EC Aspirin    Penicillins Other (See Comments)    SYNCOPE? , "  makes me real weak when I take it; like I'll pass out"  Has patient had a PCN reaction causing immediate rash, facial/tongue/throat swelling, SOB or lightheadedness with hypotension: Yes Has patient had a PCN reaction causing severe rash involving mucus membranes or skin necrosis: no Has patient had a PCN reaction that required hospitalization no Has patient had a PCN reaction occurring within the last 10 years: no If all of the above   Amlodipine Swelling   Bactrim [Sulfamethoxazole-Trimethoprim] Rash   Contrast Media [Iodinated Diagnostic Agents] Itching   Iron Itching and Other (See Comments)    "they gave me iron in dialysis; had to give me Benadryl cause I had to have the iron" (05/02/2012)   Nitrofurantoin Hives   Tylenol [Acetaminophen] Itching and Other (See Comments)    Makes her feet on fire per pt   Gabapentin Other (See Comments)    Unknown reaction   Hydralazine Itching   Dexilant [Dexlansoprazole] Other (See Comments)    Upset stomach   Levaquin [Levofloxacin In D5w] Rash   Morphine And Related Itching and Other (See Comments)    Itching in feet   Plavix [Clopidogrel Bisulfate] Rash   Protonix [Pantoprazole Sodium] Rash   Venofer [Ferric Oxide] Itching and Other (See  Comments)    Patient reports using Benadryl prior to doses as [place] History:   Social History   Socioeconomic History   Marital status: Married    Spouse name: [name]   Number of children: Not on file   Years of education: Not on file   Highest education level: Not on file  Occupational History   Not on file  Social Needs   Financial resource strain: Not very hard   Food insecurity    Worry: Never true    Inability: Never true   Transportation needs    Medical: No    Non-medical: No  Tobacco Use   Smoking status: Never Smoker   Smokeless tobacco: Never Used  Substance and Sexual Activity   Alcohol use: No    Alcohol/week: 0.0 standard drinks   Drug use: No   Sexual activity: Yes    Birth control/protection: Surgical  Lifestyle   Physical activity    Days per week: Patient refused    Minutes per session: Patient refused   Stress: Only a little  Relationships   Press photographer on phone: Patient refused    Gets together: Patient refused    Attends religious service: Patient refused    Active member of club or organization: Patient refused    Attends meetings of clubs or organizations: Patient refused    Relationship status: Patient refused   Intimate partner violence    Fear of current or ex partner: No    Emotionally abused: No    Physically abused: No    Forced sexual activity: No  Other Topics Concern   Not on file  Social History Narrative   Lives in [place], [place] with husband.  Dialysis pt - mwf.    Family History:   The patient's family history includes Diabetes in her brother, father, mother, and sister; Heart attack in her brother, brother, and mother; Heart disease in her father and mother; Hyperlipidemia in her brother, father, and mother; Hypertension in her father, mother, sister, and sister; Other in an other family member. There is no history of Colon cancer, Esophageal cancer, Liver disease, Kidney  disease, or Colon polyps.  ROS:  Please see  the history of present illness.  All other ROS reviewed and negative.     Physical Exam/Data:   Vitals:   06/03/19 2100 06/03/19 2200 06/03/19 2331 06/04/19 0500  BP: (!) 204/64 (!) 155/50 135/69 (!) 165/53  Pulse:  65 66 68  Resp: 16 17 16 16   Temp:   98.7 F (37.1 C) 97.9 F (36.6 C)  TempSrc:   Oral Oral  SpO2:  96% 100% 100%  Weight:   57.2 kg   Height:   5\' 1"  (1.549 m)    No intake or output data in the 24 hours ending 06/04/19 0905 Filed Weights   06/03/19 1525 06/03/19 2331  Weight: 61.2 kg 57.2 kg   Body mass index is 23.83 kg/m.   Gen: Elderly woman, appears comfortable at rest. HEENT: Conjunctiva and lids normal, oropharynx clear. Neck: Supple, no elevated JVP or carotid bruits, no thyromegaly. Lungs: Clear to auscultation, nonlabored breathing at rest. Cardiac: Regular rate and rhythm, no S3, soft systolic murmur, no pericardial rub. Abdomen: Soft, nontender, bowel sounds present. Extremities: Left arm AV fistula, no pitting edema of legs, distal pulses 2+. Skin: Warm and dry. Musculoskeletal: No kyphosis. Neuropsychiatric: Alert and oriented x3, affect grossly appropriate.  EKG:  An ECG dated 06/04/2019 was personally reviewed today and demonstrated:  Sinus rhythm with LVH and repolarization abnormalities.  Telemetry:  I personally reviewed telemetry which shows sinus rhythm with rare PVCs.  Relevant CV Studies:  Cardiac catheterization 07/13/2018:  Ost RPDA to RPDA lesion is 75% stenosed.  Mid LM to Dist LM lesion is 60% stenosed.  Ost Ramus to Ramus lesion is 80% stenosed.  Ost Cx to Mid Cx lesion is 99% stenosed.  Prox LAD lesion is 90% stenosed.  Ost 1st Diag to 1st Diag lesion is 100% stenosed.  Ost RCA to Prox RCA lesion is 100% stenosed.  LIMA graft was visualized by angiography and is large.  The graft exhibits no disease.  SVG graft was visualized by angiography and is large.  The graft  exhibits no disease.  SVG graft was visualized by angiography and is normal in caliber.  SVG graft was visualized by angiography and is large.  The graft exhibits no disease.  The left ventricular systolic function is normal.  LV end diastolic pressure is mildly elevated.  The left ventricular ejection fraction is 55-65% by visual estimate.   1. Severe 3 vessel occlusive CAD    - 60% distal left main    - 90% mid LAD    - 100% first diagonal at prior stent site    - 99% proximal LCx at prior stent site    - 80% ramus intermediate.    - 100% ostial RCA at prior stent site    - 75% proximal PDA 2. Patent LIMA to the LAD 3. Patent SVG to ramus intermediate 4. Patent SVG to OM 5. Patent SVG to the distal RCA. 6. Good LV function 7. Mildly elevated LVEDP  Plan: Compared to prior cath in April 2018 the diagonal is now occluded. There is progression of disease in the PDA. It is unclear if this is the reason for her chest pain. She is severely hypertensive and EDP is mildly elevated. I would recommend medical management with good BP control. If she has refractory angina despite good BP and volume management we could consider PCI of the PDA but this would have to be done through the SVG and in general she has not responded well to stenting in the  past with all prior stents occluded. I will increase her Coreg dose. She will need dialysis in am.   Echocardiogram 12/14/2018:  1. The left ventricle has normal systolic function with an ejection fraction of 60-65%. The cavity size was normal. There is mild concentric left ventricular hypertrophy. Left ventricular diastolic Doppler parameters are consistent with impaired  relaxation. Elevated left ventricular end-diastolic pressure No evidence of left ventricular regional wall motion abnormalities.  2. Left atrial size was mildly dilated.  3. The mitral valve is grossly normal. Mild thickening of the mitral valve leaflet. There is mild mitral  annular calcification present. Mitral valve regurgitation is mild to moderate by color flow Doppler.  4. The tricuspid valve is grossly normal.  5. The aortic valve is tricuspid. Mild thickening of the aortic valve.  6. Moderate aortic annular calcification.  7. The aortic root is normal in size and structure.  Laboratory Data:  Chemistry Recent Labs  Lab 06/03/19 1556 06/04/19 0501  NA 136 135  K 4.0 4.0  CL 96* 97*  CO2 29 26  GLUCOSE 89 73  BUN 35* 48*  CREATININE 5.96* 6.97*  CALCIUM 9.6 9.7  GFRNONAA 6* 5*  GFRAA 7* 6*  ANIONGAP 11 12    No results for input(s): PROT, ALBUMIN, AST, ALT, ALKPHOS, BILITOT in the last 168 hours. Hematology Recent Labs  Lab 06/03/19 1556 06/04/19 0801  WBC 9.1 4.4  RBC 2.87* 2.64*  HGB 9.9* 9.1*  HCT 32.5* 29.7*  MCV 113.2* 112.5*  MCH 34.5* 34.5*  MCHC 30.5 30.6  RDW 18.1* 17.9*  PLT 207 177   Cardiac Enzymes Recent Labs  Lab 06/03/19 1556 06/03/19 1840  TROPONINIHS 30* 34*    Radiology/Studies:  Dg Chest 2 View  Result Date: 06/03/2019 CLINICAL DATA:  Chest pain EXAM: CHEST - 2 VIEW COMPARISON:  March 08, 2019 FINDINGS: Linear scarring or atelectasis at the right lung base. Chronic blunting of the left costophrenic angle. Mild pulmonary vascular congestion. No new consolidation or edema. Stable cardiomediastinal contours with mild cardiomegaly. IMPRESSION: Mild pulmonary vascular congestion. Electronically Signed   By: Macy Mis M.D.   On: 06/03/2019 16:05    Assessment and Plan:   1.  Recurring episodes of chest pain with typical and atypical features.  High-sensitivity troponin I levels are in the 30s and flat not suggestive of ACS. ECG shows stable findings of LVH with repolarization abnormalities.  2.  Severe multivessel CAD status post CABG with cardiac catheterization in January of this year showing patent bypass grafts but progressive native disease it was felt to be best managed medically.  LVEF 60 to 65%  at that time.  3.  Multiple drug allergies and intolerances.  This limits optimization of medical therapy for ischemic heart disease and anginal control.  4.  ESRD on hemodialysis.  5.  Essential hypertension.  6.  History of paroxysmal atrial fibrillation and NSVT, on low-dose amiodarone.  Currently in sinus rhythm with rare PVCs.  She is not anticoagulated with prior history of GI bleeding.  7.  Hyperlipidemia, on Zocor.  Chart reviewed.  Current cardiac regimen includes aspirin, Brilinta, Coreg, clonidine, amiodarone, Imdur, and Zocor.  Plan is to divide Imdur dose to 60 mg twice daily, replace Zocor with Crestor to reduce chance of drug-drug interactions, start Plendil at 2.5 mg daily for additional blood pressure and antianginal control (suspect her intolerance to Norvasc was more related to her leg swelling, and hopefully she will tolerate Plendil better).  Cannot add Ranexa  in light of ESRD, will not attempt to add hydralazine given reported allergy.  At this point do not anticipate further ischemic testing.  Would observe today following medication changes, Dr. Bronson Ing to reevaluate tomorrow on rounds.  Signed, Rozann Lesches, MD  06/04/2019 9:05 AM

## 2019-06-04 NOTE — Consult Note (Addendum)
Referring Provider: Dr. Carles Collet Primary Care Physician:  [name], MD Primary Gastroenterologist:  Dr. Laural Golden  Date of Admission: 06/03/19 Date of Consultation: 06/04/19  Reason for Consultation:  Melena  HPI:  Shawna [name] is a 78 y.o. 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 She is on Brilinta 60 mg twice daily and aspirin 81 mg daily.She is followed by Dr. Laural Golden outpatient.  She recently underwent further evaluation for reported melena with Givens capsule on 03/07/2019 with bulbar duodenitis without stigmata of bleeding, jejunal AV malformations without stigmata of bleeding, jejunal diverticulum without stigmata of bleed.  She presented to [facility] emergency department on the same day of Givens capsule due to hematochezia.  Her hemoglobin dropped to 7.3 and she received 2 units PRBCs.  Colonoscopy on 03/09/2019 with diverticulosis in the entire colon, patent end-to-end ileocolonic anastomosis, 2 small polyps in the proximal ascending colon, 1 large polyp in proximal transverse colon removed piecemeal.  Pathology revealed tubular adenomas.  Next colonoscopy in 3 years.  Patient was last seen by Dr. Laural Golden on 05/14/2019. She reported ongoing rectal bleeding the week prior to her office visit with passing moderate amount of dark blood but denied any further bleeding episodes.  She is also complaining of abdominal bloating, excessive flatus postprandial bowel movement, intermittent abdominal cramping.  Denied frank diarrhea or watery stool.  Regarding her rectal bleeding and history of GI bleed, her hemoglobin was stable/improved at 11.3.  Suspect that her prior lower GI bleeding was possibly  diverticular.  She was to discontinue aspirin as Dr. Bronson Ing agreed.  She was prescribed metronidazole 50 mg daily x10 days for possible SIBO.   Patient presented to the emergency department on 06/03/19 for chest pain.  She had 1-2-week history of intermittent chest pain with nitroglycerin providing some relief.  She developed chest pain at dialysis and was advised to proceed to the emergency department for further evaluation.  It was suspected patient chest pain was partially precipitated by uncontrolled blood pressure due to noncompliance with medications.  Her troponins were flat with no suggestion of ACS.  EKG with sinus rhythm, LVH, ILBBB.  Check cardiac catheterization in January 2020 with severe multivessel native CAD with patent LIMA to the LAD, patent SVG to the ramus intermedius, patent SVG to the obtuse marginal, and patent SVG to the distal RCA.  New findings of diagonal occlusion and progressive disease to the PDA.  Plans to manage medically.  Cardiology has seen patient during admission with plans for medical management.  They do not anticipate further ischemic testing.  Plans to observe today following medication changes and reevaluate tomorrow. Patient admitted to melena over the past week and requested GI evaluation.  Hemoglobin on admission 9.9, down from 10.4 05/17/2019 and 11.3 on 05/02/2019.   Patient is somewhat if a difficult historian. Today she states she stopped taking aspirin unless she has chest pain after her last visit with Dr. Laural Golden. States her stool is black/really dark. Started again last week. Occurring daily. Between 2-4 BMs daily. No bright red blood or maroon color. Stools are formed. Only one black BM yesterday. Only one black BM today. Reports 2 weeks ago, stools had return to normal. AT that time she still had 2-3 BMs daily. No abdominal pain  at this time. Reports occasional/fairly rare epigastric pain. Maybe once or twice a month. No heartburn or acid reflux. Takes  omeprazole daily. No nausea or vomiting. Denies dysphagia. No NSAIDs. No iron supplementation. Taking Renvela. No pepto bismol.   Denies lightheadedness, dizziness, or feeling like she will pass out.   Prior GI evaluation: EGD 07/29/2016 by Dr. [place] Cellar due to GIB/melena:  - 2 cm hiatal hernia. - Normal stomach. - A single duodenal versus jejunal polyp - suspect adenoma, not removed during this exam as above. - A single angiodysplastic lesion in the jejunum. This could be the cause for the patient's symptoms / anemia. Treated successfully with argon plasma coagulation (APC).  EGD 04/30/2015 by Dr. Hildred Laser done due to GIB/melena: Small sliding hiatal hernia with mild changes of reflux esophagitis limited to GE junction.Erosive gastroduodenitis but no evidence of peptic ulcer disease. Mild antral telangiectasia without stigmata of bleed  EGD 03/26/2013 by Dr. [name] at [facility]: nonbleeding duodenal AVMs s/p APC, incidental esophageal stricture.   Past Medical History:  Diagnosis Date  . Acute on chronic respiratory failure with hypoxia ([place]) 10/10/2016  . Anxiety   . Arthritis   . AVM (arteriovenous malformation) of colon   . CAD (coronary artery disease)    a. s/p CABG in 2013 b. most recent DES to D1 in 10/2016 with cath showing patent LIMA-LAD, SVG-RI, SVG-Mrg and SVG-RCA  c. NST in 02/2018 showing scar with no significant ischemia  . Carotid artery disease ([place])    a. 40-98% LICA, 07/1912   . Chronic bronchitis ([place])   . Chronic diastolic CHF (congestive heart failure) ([place])    a. 02/2012 Echo EF 60-65%, nl wall motion, Gr 1 DD, mod MR  . Colon cancer ([place]) 1992  . Esophageal stricture   . ESRD on hemodialysis ([place])    ESRD due to HTN, started dialysis 2011 and gets HD at [facility] with Dr Hinda Lenis on MWF schedule.  Access is LUA AVF as of Sept 2014.   Marland Kitchen GERD (gastroesophageal reflux disease)   . High cholesterol 12/2011  . History of blood transfusion 07/2011;  12/2011; 01/2012 X 2; 04/2012  . History of gout   . History of lower GI bleeding   . Hypertension   . Iron deficiency anemia   . Mitral regurgitation    a. Moderate by echo, 02/2012  . Myocardial infarction ([place])   . Ovarian cancer ([place]) 1992  . Pneumonia ~ 2009  . PUD (peptic ulcer disease)   . TIA (transient ischemic attack)     Past Surgical History:  Procedure Laterality Date  . A/V SHUNTOGRAM Left 03/19/2019   Procedure: A/V SHUNTOGRAM;  Surgeon: Katha Cabal, MD;  Location: [place] CV LAB;  Service: Cardiovascular;  Laterality: Left;  . ABDOMINAL HYSTERECTOMY  1992  . APPENDECTOMY  06/1990  . AV FISTULA PLACEMENT  07/2009   left upper arm  . AV FISTULA PLACEMENT Right 09/06/2016   Procedure: RIGHT FOREARM ARTERIOVENOUS (AV) GRAFT;  Surgeon: Elam Dutch, MD;  Location: [facility] OR;  Service: Vascular;  Laterality: Right;  . AV FISTULA PLACEMENT N/A 02/24/2017   Procedure: INSERTION OF ARTERIOVENOUS (AV) GORE-TEX GRAFT ARM (BRACHIAL AXILLARY);  Surgeon: Katha Cabal, MD;  Location: [facility] ORS;  Service: Vascular;  Laterality: N/A;  . [place] Right 09/06/2016   Procedure: REMOVAL OF Right Arm ARTERIOVENOUS GORETEX GRAFT and Vein Patch angioplasty of brachial artery;  Surgeon: Angelia Mould, MD;  Location: [place];  Service:  Vascular;  Laterality: Right;  . COLON RESECTION  1992  . COLON SURGERY    . COLONOSCOPY N/A 03/09/2019   Procedure: COLONOSCOPY;  Surgeon: Rogene Houston, MD;  [facility];  Service: Endoscopy;  Laterality: N/A;  . CORONARY ANGIOPLASTY WITH STENT PLACEMENT  12/15/11   "2"  . CORONARY ANGIOPLASTY WITH STENT PLACEMENT  y/2013   "1; makes total of 3" (05/02/2012)  . CORONARY ARTERY BYPASS GRAFT  06/13/2012   Procedure: CORONARY ARTERY BYPASS GRAFTING (CABG);  Surgeon: Grace Isaac, MD;  Location: [place];  Service: Open Heart Surgery;  Laterality: N/A;  cabg x four;  using left internal mammary artery, and left leg greater  saphenous vein harvested endoscopically  . CORONARY STENT INTERVENTION N/A 10/13/2016   Procedure: Coronary Stent Intervention;  Surgeon: Troy Sine, MD;  Location: [place] CV LAB;  Service: Cardiovascular;  Laterality: N/A;  . DIALYSIS/PERMA CATHETER REMOVAL N/A 04/18/2017   Procedure: DIALYSIS/PERMA CATHETER REMOVAL;  Surgeon: Katha Cabal, MD;  Location: [place] [facility];  Service: Cardiovascular;  Laterality: N/A;  . DILATION AND CURETTAGE OF UTERUS    . ESOPHAGOGASTRODUODENOSCOPY  01/20/2012   Procedure: ESOPHAGOGASTRODUODENOSCOPY (EGD);  Surgeon: Ladene Artist, MD,FACG;  Location: [facility] ENDOSCOPY;  Service: Endoscopy;  Laterality: N/A;  . ESOPHAGOGASTRODUODENOSCOPY N/A 03/26/2013   Procedure: ESOPHAGOGASTRODUODENOSCOPY (EGD);  Surgeon: Irene Shipper, MD;  Location: [facility] ENDOSCOPY;  Service: Endoscopy;  Laterality: N/A;  . ESOPHAGOGASTRODUODENOSCOPY N/A 04/30/2015   Procedure: ESOPHAGOGASTRODUODENOSCOPY (EGD);  Surgeon: Rogene Houston, MD;  [facility];  Service: Endoscopy;  Laterality: N/A;  1pm - moved to 10/20 @ 1:10  . ESOPHAGOGASTRODUODENOSCOPY N/A 07/29/2016   Procedure: ESOPHAGOGASTRODUODENOSCOPY (EGD);  Surgeon: Manus Gunning, MD;  Location: [place];  Service: Gastroenterology;  Laterality: N/A;  enteroscopy  . GIVENS CAPSULE STUDY N/A 03/07/2019   Procedure: GIVENS CAPSULE STUDY;  Surgeon: Rogene Houston, MD;  [facility];  Service: Endoscopy;  Laterality: N/A;  7:30  . INTRAOPERATIVE TRANSESOPHAGEAL ECHOCARDIOGRAM  06/13/2012   Procedure: INTRAOPERATIVE TRANSESOPHAGEAL ECHOCARDIOGRAM;  Surgeon: Grace Isaac, MD;  Location: [place];  Service: Open Heart Surgery;  Laterality: N/A;  . IR GENERIC HISTORICAL  07/26/2016   IR FLUORO GUIDE CV LINE RIGHT 07/26/2016 Greggory Keen, MD [facility]  . IR GENERIC HISTORICAL  07/26/2016   IR US GUIDE VASC ACCESS RIGHT 07/26/2016 Greggory Keen, MD [facility]  . IR GENERIC HISTORICAL  08/02/2016    IR US GUIDE VASC ACCESS RIGHT 08/02/2016 Greggory Keen, MD [facility]  . IR GENERIC HISTORICAL  08/02/2016   IR FLUORO GUIDE CV LINE RIGHT 08/02/2016 Greggory Keen, MD [facility]  . IR RADIOLOGY PERIPHERAL GUIDED IV START  03/28/2017  . IR US GUIDE VASC ACCESS RIGHT  03/28/2017  . LEFT HEART CATH AND CORONARY ANGIOGRAPHY N/A 09/20/2016   Procedure: Left Heart Cath and Coronary Angiography;  Surgeon: [name], MD;  Location: [place] [facility];  Service: Cardiovascular;  Laterality: N/A;  . LEFT HEART CATH AND CORS/GRAFTS ANGIOGRAPHY N/A 10/13/2016   Procedure: Left Heart Cath and Cors/Grafts Angiography;  Surgeon: Troy Sine, MD;  Location: [place] [facility];  Service: Cardiovascular;  Laterality: N/A;  . LEFT HEART CATH AND CORS/GRAFTS ANGIOGRAPHY N/A 07/13/2018   Procedure: LEFT HEART CATH AND CORS/GRAFTS ANGIOGRAPHY;  Surgeon: Martinique, Peter M, MD;  Location: [place] [facility];  Service: Cardiovascular;  Laterality: N/A;  . LEFT HEART CATHETERIZATION WITH CORONARY ANGIOGRAM N/A 12/15/2011  Procedure: LEFT HEART CATHETERIZATION WITH CORONARY ANGIOGRAM;  Surgeon: Burnell Blanks, MD;  Location: [facility] CATH LAB;  Service: Cardiovascular;  Laterality: N/A;  . LEFT HEART CATHETERIZATION WITH CORONARY ANGIOGRAM N/A 01/10/2012   Procedure: LEFT HEART CATHETERIZATION WITH CORONARY ANGIOGRAM;  Surgeon: Peter M Martinique, MD;  Location: [facility] CATH LAB;  Service: Cardiovascular;  Laterality: N/A;  . LEFT HEART CATHETERIZATION WITH CORONARY ANGIOGRAM N/A 06/08/2012   Procedure: LEFT HEART CATHETERIZATION WITH CORONARY ANGIOGRAM;  Surgeon: Burnell Blanks, MD;  Location: [facility] CATH LAB;  Service: Cardiovascular;  Laterality: N/A;  . LEFT HEART CATHETERIZATION WITH CORONARY/GRAFT ANGIOGRAM N/A 12/10/2013   Procedure: LEFT HEART CATHETERIZATION WITH Beatrix Fetters;  Surgeon: [name], MD;  Location: [facility] CATH LAB;  Service: Cardiovascular;  Laterality: N/A;  . OVARY SURGERY      ovarian cancer  . POLYPECTOMY  03/09/2019   Procedure: POLYPECTOMY;  Surgeon: Rogene Houston, MD;  [facility];  Service: Endoscopy;;  cecal   . REVISION OF ARTERIOVENOUS GORETEX GRAFT N/A 02/24/2017   Procedure: REVISION OF ARTERIOVENOUS GORETEX GRAFT (RESECTION);  Surgeon: Katha Cabal, MD;  Location: [facility] ORS;  Service: Vascular;  Laterality: N/A;  Earney Mallet N/A 10/15/2013   Procedure: Fistulogram;  Surgeon: Serafina Mitchell, MD;  Location: [facility] CATH LAB;  Service: Cardiovascular;  Laterality: N/A;  . THROMBECTOMY / ARTERIOVENOUS GRAFT REVISION  2011   left upper arm  . TUBAL LIGATION  1980's  . UPPER EXTREMITY ANGIOGRAPHY Bilateral 12/06/2016   Procedure: Upper Extremity Angiography;  Surgeon: Katha Cabal, MD;  Location: [place] [facility];  Service: Cardiovascular;  Laterality: Bilateral;  . UPPER EXTREMITY INTERVENTION Left 06/06/2017   Procedure: UPPER EXTREMITY INTERVENTION;  Surgeon: Katha Cabal, MD;  Location: [place] [facility];  Service: Cardiovascular;  Laterality: Left;    Prior to Admission medications   Medication Sig Start Date End Date Taking? Authorizing Provider  ALPRAZolam Duanne Moron) 0.5 MG tablet Take 0.5 mg by mouth daily. 10/16/18   [provider]  amiodarone (PACERONE) 200 MG tablet Take 1 tablet (200 mg total) by mouth daily. 02/19/19   Herminio Commons, MD  aspirin EC 81 MG tablet Take 1 tablet (81 mg total) by mouth daily. Restart on 03/13/19 03/13/19   Orson Eva, MD  benzonatate (TESSALON) 100 MG capsule TAKE 1 CAPSULE BY MOUTH THREE TIMES DAILY AS NEEDED FOR COUGH 03/19/19   [provider]  carvedilol (COREG) 12.5 MG tablet Take 1 tablet (12.5 mg total) by mouth 2 (two) times daily with a meal. Additional refills per PCP 03/13/19 05/14/19  Herminio Commons, MD  cloNIDine (CATAPRES) 0.1 MG tablet Take 1 tablet (0.1 mg total) by mouth 2 (two) times daily. 02/01/19   Daune Perch, NP  diphenhydrAMINE (BENADRYL) 25 mg  capsule Take 50 mg by mouth daily as needed for itching or allergies.     [provider]  fluticasone (FLONASE) 50 MCG/ACT nasal spray Place 1 spray at bedtime as needed into both nostrils for allergies.     [provider]  hyoscyamine (LEVSIN SL) 0.125 MG SL tablet Place 1 tablet (0.125 mg total) under the tongue 3 (three) times daily. 05/16/19   Rogene Houston, MD  isosorbide mononitrate (IMDUR) 120 MG 24 hr tablet Take 120 mg by mouth daily. 03/13/19   [provider]  lidocaine-prilocaine (EMLA) cream Apply 1 application topically every Monday, Wednesday, and Friday. Prior to dialysis    [provider]  loratadine (CLARITIN) 10  MG tablet Take 1 tablet (10 mg total) by mouth daily as needed for allergies. 07/14/18   Hosie Poisson, MD  metroNIDAZOLE (FLAGYL) 250 MG tablet Take 1 tablet (250 mg total) by mouth 3 (three) times daily. 05/14/19   Rogene Houston, MD  multivitamin (RENA-VIT) TABS tablet Take 1 tablet by mouth daily.    [provider]  nitroGLYCERIN (NITROSTAT) 0.4 MG SL tablet DISSOLVE ONE TABLET UNDER THE TONGUE EVERY 5 MINUTES AS NEEDED FOR CHEST PAIN.  DO NOT EXCEED A TOTAL OF 3 DOSES IN 15 MINUTES 10/08/18   Herminio Commons, MD  nystatin ointment (MYCOSTATIN) APPLY A SMALL AMOUNT TOPICALLY TWICE DAILY FOR 7 DAYS TO EXTERNAL ANAL AREA FOR ANAL ITCHINESS 03/21/19   [provider]  omeprazole (PRILOSEC) 20 MG capsule Take 1 capsule by mouth once daily 01/31/19   Rehman, Mechele Dawley, MD  ondansetron (ZOFRAN-ODT) 4 MG disintegrating tablet DISSOLVE 1 TABLET IN MOUTH TWICE DAILY AS NEEDED 03/05/19   [provider]  sevelamer carbonate (RENVELA) 800 MG tablet TAKE 3 TABLETS BY MOUTH THREE TIMES DAILY WITH MEALS AND 2 TABLETS WITH SNACKS 04/03/19   [provider]  simvastatin (ZOCOR) 20 MG tablet TAKE ONE TABLET (20 MG) BY MOUTH DAILY AT BEDTIME 04/03/18   Herminio Commons, MD  ticagrelor (BRILINTA) 60 MG TABS tablet  Take 1 tablet (60 mg total) by mouth 2 (two) times daily. Restart on 03/13/19 04/15/19   Almyra Deforest, PA    Current Facility-Administered Medications  Medication Dose Route Frequency Provider Last Rate Last Dose  . ALPRAZolam Duanne Moron) tablet 0.5 mg  0.5 mg Oral Daily Opyd, Ilene Qua, MD   0.5 mg at 06/04/19 0845  . amiodarone (PACERONE) tablet 200 mg  200 mg Oral Daily Opyd, Ilene Qua, MD   200 mg at 06/04/19 0847  . carvedilol (COREG) tablet 12.5 mg  12.5 mg Oral BID WC Opyd, Ilene Qua, MD   12.5 mg at 06/04/19 0847  . carvedilol (COREG) tablet 12.5 mg  12.5 mg Oral Once Opyd, Ilene Qua, MD      . cloNIDine (CATAPRES) tablet 0.1 mg  0.1 mg Oral BID Opyd, Ilene Qua, MD   0.1 mg at 06/04/19 0847  . famotidine (PEPCID) tablet 10 mg  10 mg Oral Daily Opyd, Ilene Qua, MD   10 mg at 06/04/19 0846  . felodipine (PLENDIL) 24 hr tablet 2.5 mg  2.5 mg Oral Daily Satira Sark, MD      . hyoscyamine (LEVSIN SL) SL tablet 0.125 mg  0.125 mg Sublingual TID Vianne Bulls, MD   0.125 mg at 06/04/19 0845  . isosorbide mononitrate (IMDUR) 24 hr tablet 60 mg  60 mg Oral BID Satira Sark, MD   60 mg at 06/04/19 0902  . nitroGLYCERIN (NITROSTAT) SL tablet 0.4 mg  0.4 mg Sublingual Q5 Min x 3 PRN Opyd, Ilene Qua, MD      . NON FORMULARY 20 mg  20 mg Oral BID Jeralynn Vaquera S, PA-C      . ondansetron [facility]) injection 4 mg  4 mg Intravenous Q6H PRN Opyd, Ilene Qua, MD      . rosuvastatin (CRESTOR) tablet 10 mg  10 mg Oral Daily Satira Sark, MD      . sevelamer carbonate (RENVELA) tablet 1,600 mg  1,600 mg Oral With snacks Tat, David, MD      . sevelamer carbonate (RENVELA) tablet 2,400 mg  2,400 mg Oral TID WC Orson Eva, MD  2,400 mg at 06/04/19 1222  . sodium chloride flush (NS) 0.9 % injection 3 mL  3 mL Intravenous Once Opyd, Ilene Qua, MD      . ticagrelor (BRILINTA) tablet 60 mg  60 mg Oral BID Vianne Bulls, MD   60 mg at 06/04/19 0853    Allergies as of 06/03/2019 - Review Complete  06/03/2019  Allergen Reaction Noted  . Aspirin Other (See Comments) 12/15/2011  . Penicillins Other (See Comments) 12/15/2011  . Amlodipine Swelling 01/19/2015  . Bactrim [sulfamethoxazole-trimethoprim] Rash 12/15/2011  . Contrast media [iodinated diagnostic agents] Itching 12/15/2011  . Iron Itching and Other (See Comments) 05/02/2012  . Nitrofurantoin Hives 05/27/2015  . Tylenol [acetaminophen] Itching and Other (See Comments) 07/08/2016  . Gabapentin Other (See Comments) 02/08/2017  . Hydralazine Itching 12/14/2018  . Dexilant [dexlansoprazole] Other (See Comments) 05/29/2013  . Levaquin [levofloxacin in d5w] Rash 10/04/2013  . Morphine and related Itching and Other (See Comments) 09/16/2014  . Plavix [clopidogrel bisulfate] Rash 01/05/2012  . Protonix [pantoprazole sodium] Rash 03/21/2013  . Venofer [ferric oxide] Itching and Other (See Comments) 05/02/2012    Family History  Problem Relation Age of Onset  . Heart disease Mother        Heart Disease before age 49  . Hyperlipidemia Mother   . Hypertension Mother   . Diabetes Mother   . Heart attack Mother   . Heart disease Father        Heart Disease before age 75  . Hyperlipidemia Father   . Hypertension Father   . Diabetes Father   . Diabetes Sister   . Hypertension Sister   . Diabetes Brother   . Hyperlipidemia Brother   . Heart attack Brother   . Hypertension Sister   . Heart attack Brother   . Colon cancer Child 46  . Other Other        noncontributory for early CAD  . Esophageal cancer Neg Hx   . Liver disease Neg Hx   . Kidney disease Neg Hx   . Colon polyps Neg Hx     Social History   Socioeconomic History  . Marital status: Married    Spouse name: [name]  . Number of children: Not on file  . Years of education: Not on file  . Highest education level: Not on file  Occupational History  . Not on file  Social Needs  . Financial resource strain: Not very hard  . Food insecurity    Worry: Never  true    Inability: Never true  . Transportation needs    Medical: No    Non-medical: No  Tobacco Use  . Smoking status: Never Smoker  . Smokeless tobacco: Never Used  Substance and Sexual Activity  . Alcohol use: No    Alcohol/week: 0.0 standard drinks  . Drug use: No  . Sexual activity: Yes    Birth control/protection: Surgical  Lifestyle  . Physical activity    Days per week: Patient refused    Minutes per session: Patient refused  . Stress: Only a little  Relationships  . Social Herbalist on phone: Patient refused    Gets together: Patient refused    Attends religious service: Patient refused    Active member of club or organization: Patient refused    Attends meetings of clubs or organizations: Patient refused    Relationship status: Patient refused  . Intimate partner violence    Fear of current or ex partner: No  Emotionally abused: No    Physically abused: No    Forced sexual activity: No  Other Topics Concern  . Not on file  Social History Narrative   Lives in [place], [place] with husband.  Dialysis pt - [facility].    Review of Systems: Gen: Denies fever, chills, loss of appetite, change in weight or weight loss CV: Mild recurrence of chest pain earlier today. No palpitations.  Resp: Denies shortness of breath or cough GI: See HPI  GU : Doesn't make urine. On dialysis.  MS: Denies joint pain Derm: Denies rash Psych: Denies depression, anxiety Heme: See HPI  Physical Exam: Vital signs in last 24 hours: Temp:  [97.9 F (36.6 C)-98.7 F (37.1 C)] 97.9 F (36.6 C) (11/24 0500) Pulse Rate:  [65-68] 68 (11/24 0500) Resp:  [16-20] 16 (11/24 0500) BP: (135-209)/(50-77) 165/53 (11/24 0500) SpO2:  [93 %-100 %] 100 % (11/24 0500) Weight:  [57.2 kg-61.2 kg] 57.2 kg (11/23 2331) Last BM Date: 06/03/19 General:   Alert,  Well-developed, well-nourished, pleasant and cooperative in NAD Head:  Normocephalic and atraumatic. Eyes:  Sclera clear, no icterus.    Conjunctiva pink. Ears:  Normal auditory acuity. Lungs:  Clear throughout to auscultation.   No wheezes, crackles, or rhonchi. No acute distress. Heart:  Regular rate and rhythm; no murmurs, clicks, rubs,  or gallops. Abdomen:  Soft, nontender and nondistended. No masses, hepatosplenomegaly or hernias noted. Normal bowel sounds, without guarding, and without rebound.   Rectal:  Deferred  Msk:  Symmetrical without gross deformities. Normal posture. Extremities:  Without edema. Neurologic:  Alert and  oriented x4;  grossly normal neurologically. Skin:  Intact without significant lesions or rashes. Psych: Normal mood and affect.  Intake/Output from previous day: No intake/output data recorded. Intake/Output this shift: Total I/O In: 200 [P.O.:200] Out: -   Lab Results: Recent Labs    06/03/19 1556 06/04/19 0801  WBC 9.1 4.4  HGB 9.9* 9.1*  HCT 32.5* 29.7*  PLT 207 177   BMET Recent Labs    06/03/19 1556 06/04/19 0501  NA 136 135  K 4.0 4.0  CL 96* 97*  CO2 29 26  GLUCOSE 89 73  BUN 35* 48*  CREATININE 5.96* 6.97*  CALCIUM 9.6 9.7    Studies/Results: Dg Chest 2 View  Result Date: 06/03/2019 CLINICAL DATA:  Chest pain EXAM: CHEST - 2 VIEW COMPARISON:  March 08, 2019 FINDINGS: Linear scarring or atelectasis at the right lung base. Chronic blunting of the left costophrenic angle. Mild pulmonary vascular congestion. No new consolidation or edema. Stable cardiomediastinal contours with mild cardiomegaly. IMPRESSION: Mild pulmonary vascular congestion. Electronically Signed   By: Macy Mis M.D.   On: 06/03/2019 16:05    Impression: 78 year old female with multiple medical problems including significant coronary artery disease on Brilinta and aspirin, ESRD on hemodialysis, GI bleed secondary to duodenal and jejunal AVMs with recurrent intermittent melena, and colon cancer in 1992 who was admitted on 06/03/19 for chest pain and reported recurrence of melena.  Cardiology  has seen patient during admission with plans for medical management.  They do not anticipate further ischemic testing with plans to observe today following medication changes and reevaluate tomorrow.  Melena: On admission, hemoglobin 9.9 and 9.1 today. Overall, this is down from 11.3 on 05/02/2019.  Over the last week, she has been having 2-4 melenotic stools a day. Yesterday with 1 episode of melena and today with one episode of melena. No other significant upper or lower GI symptoms. On  omeprazole daily outpatient. This has been a chronic intermittent issue at least since 2014 with APC therapy in 2014 and 2018. Givens capsule on 03/07/2019 for evaluation of melena with bulbar duodenitis without stigmata of bleeding, jejunal AVMs without stigmata of bleeding, and jejunal diverticulum without stigmata of bleed. Suspect recurrent melena is likely secondary to duodenal and jejunal AVMs in the setting of Brilinta and aspirin.  Patient was advised to discontinue aspirin and only continue Brilinta at her last visit with Dr. Laural Golden on 05/14/2019.  She had discontinued aspirin until a couple of weeks ago when she resumed taking it due to chest pain.  Suspect resuming aspirin likely contributed to recurrence of GI bleed as patient reported no melena at least 2 weeks prior to this recurrence.    Will discuss role of procedures with Dr. Oneida Alar. Patient would not be able to have procedure today as she has been on a regular diet. Will plan to treat supportively and transfuse as necessary. Cardiology also has plans to follow-up tomorrow to ensure no need for further ischemic testing. Spoke with Dr. Domenic Polite who states we can discontinue aspirin at this time. Trying to get patient started back on omeprazole. Discussed with pharmacists who has asked I place the order and she will reach out to patient to have her bring her home meds or they will order it. She has allergy to Protonix. Limited ability to adjust Pepcid due to  hemodialysis.   Plan: Continue to monitor for ongoing melena.  Follow H/H Transfuse as necessary.  Continue Pepcid for now. Trying to get omeprazole 20 mg BID started. Spoke with pharmacy who is reaching out to patient to have her home meds brought to the hospital vs ordering this medication.  Discontinue aspirin.  To discuss with Dr. Oneida Alar regarding the role of procedures.  Patient will likely need hematology consult outpatient as this is likely going to continue to be a chronic problem.      LOS: 0 days    06/04/2019, 1:38 PM   Aliene Altes, [facility]

## 2019-06-04 NOTE — Progress Notes (Signed)
Refused coreg due BP in 120's.  Says she will not take BP meds if BP lower than 160's and 170's.  Information passed on to Dr. Carles Collet.

## 2019-06-04 NOTE — Progress Notes (Signed)
PROGRESS NOTE  Shawna [name] HER:[id number] DOB: 1941-05-30 DOA: 06/03/2019 PCP: [name], MD  Brief History:  78 y/o femalewith medical history ofESRD(MWF)who normally dialyzes at [facility] [place], diastolic CHF, hypertension, hyperlipidemia, colonic AVMs, TIA, coronary artery disease presenting with 1 to 2-week history of intermittent chest pain.  She has a difficult historian, but states that she has been taking nitroglycerin which time she gets the chest pain with some relief of her symptoms.  She has had some shortness of breath but denies any nausea, diaphoresis, or syncope.  She states that she has the chest pain at rest, but it is also brought on by some activity.  She was at dialysis on 06/03/2019 when she developed chest pain once again.  She completed 3-1/2 hours of dialysis.  Because of her chest pain, the patient was sent to the emergency department for further evaluation.  Notably, the patient frequently adjusts her own antihypertensive medications without notifying her physicians.  However, she claims that she has been mostly compliant.  She denies any fevers, chills, coughing, hemoptysis, hematemesis, abdominal pain, hematochezia. The patient does note some melena over the past week.  She denies any NSAID use. In the emergency department, the patient was afebrile hemodynamically stable saturating 100% room air.  Blood pressure was as high as 209/77.  BMP was essentially unremarkable except for serum creatinine 6.97.  Hemoglobin was 9.9 which is near her baseline.  EKG shows sinus rhythm with incomplete left bundle branch block.  Chest x-ray showed increased interstitial markings.  Assessment/Plan: Chest pain/coronary artery disease -She has typical and atypical symptoms -Suspect that this is partly precipitated by the patient's uncontrolled blood pressure with which she has had some question of compliance with her antihypertensive medications -She has had a long  history of adjusting her own antihypertensive therapy -troponins flat, no suggestive ACS -personally reviewed EKG--sinus, LVH, ILBBB -Cardiac catheterization performed back in January 2020 revealed severe multivessel native CAD with patent LIMA to the LAD, patent SVG to the ramus intermedius, patent SVG to the obtuse marginal, and patent SVG to the distal RCA.  New findings at that time included a diagonal occlusion and progressive disease in the PDA, although these were felt to be best managed medically and would require fairly complex PCI if attempted. -continue ASA, Brillinta -continue Coreg and imdur  Melena -Hgb stable -baseline Hgb ~10 -pt requesting GI eval which I have requested -colonoscopy on 03/09/2019 with removal of 2 small tubular adenomas along with large polyp from transverse colon which was snared piecemeal and residual polyp ablated with APC.  ESRD -pt had 3.5hrs HD on 06/03/19 -consult renal if pt has to remained hospitalized through 11/25  Paroxysmal Afib/NSVT -Not on [facility] due to GIB -CHADS VASc--5 -continue amio  Uncontrolled HTN -pt has long hx of adjusting her own anti-HTN regimen -continue coreg, clonidine, imdur  Hyperlipidemia -continue statin  Anxiety -continue alprazolam      Disposition Plan:   Home 11/24 or 11/25 when cleared by GI, cardis Family Communication:   No Family at bedside  Consultants:  Cardiology, GI  Code Status:  FULL  DVT Prophylaxis:  SCDs   Procedures: As Listed in Progress Note Above  Antibiotics: None       Subjective: Patient denies fevers, chills, headache, chest pain, dyspnea, nausea, vomiting, diarrhea, abdominal pain, dysuria, hematuria, hematochezia, and melena.   Objective: Vitals:   06/03/19 2100 06/03/19 2200 06/03/19 2331 06/04/19 0500  BP: Marland Kitchen)  204/64 (!) 155/50 135/69 (!) 165/53  Pulse:  65 66 68  Resp: 16 17 16 16   Temp:   98.7 F (37.1 C) 97.9 F (36.6 C)  TempSrc:   Oral Oral  SpO2:   96% 100% 100%  Weight:   57.2 kg   Height:   5\' 1"  (1.549 m)    No intake or output data in the 24 hours ending 06/04/19 0908 Weight change:  Exam:   General:  Pt is alert, follows commands appropriately, not in acute distress  HEENT: No icterus, No thrush, No neck mass, [place]/AT  Cardiovascular: RRR, S1/S2, no rubs, no gallops  Respiratory: CTA bilaterally, no wheezing, no crackles, no rhonchi  Abdomen: Soft/+BS, non tender, non distended, no guarding  Extremities: No edema, No lymphangitis, No petechiae, No rashes, no synovitis   Data Reviewed: I have personally reviewed following labs and imaging studies Basic Metabolic Panel: Recent Labs  Lab 06/03/19 1556 06/04/19 0501  NA 136 135  K 4.0 4.0  CL 96* 97*  CO2 29 26  GLUCOSE 89 73  BUN 35* 48*  CREATININE 5.96* 6.97*  CALCIUM 9.6 9.7   Liver Function Tests: No results for input(s): AST, ALT, ALKPHOS, BILITOT, PROT, ALBUMIN in the last 168 hours. No results for input(s): LIPASE, AMYLASE in the last 168 hours. No results for input(s): AMMONIA in the last 168 hours. Coagulation Profile: No results for input(s): INR, PROTIME in the last 168 hours. CBC: Recent Labs  Lab 06/03/19 1556 06/04/19 0801  WBC 9.1 4.4  HGB 9.9* 9.1*  HCT 32.5* 29.7*  MCV 113.2* 112.5*  PLT 207 177   Cardiac Enzymes: No results for input(s): CKTOTAL, CKMB, CKMBINDEX, TROPONINI in the last 168 hours. BNP: Invalid input(s): POCBNP CBG: No results for input(s): GLUCAP in the last 168 hours. HbA1C: No results for input(s): HGBA1C in the last 72 hours. Urine analysis:    Component Value Date/Time   COLORURINE YELLOW 12/16/2012 1919   APPEARANCEUR CLOUDY (A) 12/16/2012 1919   LABSPEC 1.009 12/16/2012 1919   PHURINE 7.5 12/16/2012 1919   GLUCOSEU NEGATIVE 12/16/2012 1919   HGBUR TRACE (A) 12/16/2012 1919   BILIRUBINUR NEGATIVE 12/16/2012 1919   KETONESUR NEGATIVE 12/16/2012 1919   PROTEINUR 100 (A) 12/16/2012 1919   UROBILINOGEN  0.2 12/16/2012 1919   NITRITE NEGATIVE 12/16/2012 1919   LEUKOCYTESUR SMALL (A) 12/16/2012 1919   Sepsis Labs: @LABRCNTIP (procalcitonin:4,lacticidven:4) ) Recent Results (from the past 240 hour(s))  MRSA PCR Screening     Status: None   Collection Time: 06/04/19  2:11 AM   Specimen: Nasopharyngeal  Result Value Ref Range Status   MRSA by PCR NEGATIVE NEGATIVE Final    Comment:        The GeneXpert MRSA Assay (FDA approved for NASAL specimens only), is one component of a comprehensive MRSA colonization surveillance program. It is not intended to diagnose MRSA infection nor to guide or monitor treatment for MRSA infections. Performed at [facility], [address], [place], [place] [address]      Scheduled Meds: . ALPRAZolam  0.5 mg Oral Daily  . amiodarone  200 mg Oral Daily  . aspirin EC  81 mg Oral Daily  . carvedilol  12.5 mg Oral BID WC  . carvedilol  12.5 mg Oral Once  . cloNIDine  0.1 mg Oral BID  . famotidine  10 mg Oral Daily  . felodipine  2.5 mg Oral Daily  . hyoscyamine  0.125 mg Sublingual TID  . isosorbide mononitrate  60 mg Oral BID  . sevelamer carbonate  2,400 mg Oral TID WC  . simvastatin  20 mg Oral q1800  . sodium chloride flush  3 mL Intravenous Once  . ticagrelor  60 mg Oral BID   Continuous Infusions:  Procedures/Studies: Dg Chest 2 View  Result Date: 06/03/2019 CLINICAL DATA:  Chest pain EXAM: CHEST - 2 VIEW COMPARISON:  March 08, 2019 FINDINGS: Linear scarring or atelectasis at the right lung base. Chronic blunting of the left costophrenic angle. Mild pulmonary vascular congestion. No new consolidation or edema. Stable cardiomediastinal contours with mild cardiomegaly. IMPRESSION: Mild pulmonary vascular congestion. Electronically Signed   By: Macy Mis M.D.   On: 06/03/2019 16:05    Orson Eva, DO  [facility] Pager [phone]  If 7PM-7AM, please contact night-coverage [email or web address] Password [email or web address] 06/04/2019, 9:08 AM    LOS: 0 days

## 2019-06-04 NOTE — Care Management Obs Status (Signed)
[facility] NOTIFICATION   Patient Details  Name: Shawna [name] MRN: [id number] Date of Birth: [birth year]   Medicare Observation Status Notification Given:  Yes(spoke with patient by phone ,informed RN, Izora Gala to deliver form)    [name] 06/04/2019, 3:47 PM

## 2019-06-04 NOTE — Progress Notes (Signed)
Reported one black bm today

## 2019-06-05 ENCOUNTER — Other Ambulatory Visit: Payer: Self-pay

## 2019-06-05 DIAGNOSIS — N186 End stage renal disease: Secondary | ICD-10-CM | POA: Diagnosis present

## 2019-06-05 DIAGNOSIS — E785 Hyperlipidemia, unspecified: Secondary | ICD-10-CM | POA: Diagnosis present

## 2019-06-05 DIAGNOSIS — K31811 Angiodysplasia of stomach and duodenum with bleeding: Secondary | ICD-10-CM | POA: Diagnosis present

## 2019-06-05 DIAGNOSIS — Z886 Allergy status to analgesic agent status: Secondary | ICD-10-CM | POA: Diagnosis not present

## 2019-06-05 DIAGNOSIS — Z955 Presence of coronary angioplasty implant and graft: Secondary | ICD-10-CM | POA: Diagnosis not present

## 2019-06-05 DIAGNOSIS — F419 Anxiety disorder, unspecified: Secondary | ICD-10-CM | POA: Diagnosis present

## 2019-06-05 DIAGNOSIS — I10 Essential (primary) hypertension: Secondary | ICD-10-CM | POA: Diagnosis not present

## 2019-06-05 DIAGNOSIS — R0789 Other chest pain: Secondary | ICD-10-CM | POA: Diagnosis present

## 2019-06-05 DIAGNOSIS — I5032 Chronic diastolic (congestive) heart failure: Secondary | ICD-10-CM | POA: Diagnosis present

## 2019-06-05 DIAGNOSIS — D500 Iron deficiency anemia secondary to blood loss (chronic): Secondary | ICD-10-CM | POA: Diagnosis present

## 2019-06-05 DIAGNOSIS — Z20828 Contact with and (suspected) exposure to other viral communicable diseases: Secondary | ICD-10-CM | POA: Diagnosis present

## 2019-06-05 DIAGNOSIS — D631 Anemia in chronic kidney disease: Secondary | ICD-10-CM | POA: Diagnosis not present

## 2019-06-05 DIAGNOSIS — M1990 Unspecified osteoarthritis, unspecified site: Secondary | ICD-10-CM | POA: Diagnosis present

## 2019-06-05 DIAGNOSIS — Z888 Allergy status to other drugs, medicaments and biological substances status: Secondary | ICD-10-CM | POA: Diagnosis not present

## 2019-06-05 DIAGNOSIS — I160 Hypertensive urgency: Secondary | ICD-10-CM | POA: Diagnosis not present

## 2019-06-05 DIAGNOSIS — I25119 Atherosclerotic heart disease of native coronary artery with unspecified angina pectoris: Secondary | ICD-10-CM | POA: Diagnosis present

## 2019-06-05 DIAGNOSIS — Z992 Dependence on renal dialysis: Secondary | ICD-10-CM

## 2019-06-05 DIAGNOSIS — K5520 Angiodysplasia of colon without hemorrhage: Secondary | ICD-10-CM | POA: Diagnosis not present

## 2019-06-05 DIAGNOSIS — Z880 Allergy status to penicillin: Secondary | ICD-10-CM | POA: Diagnosis not present

## 2019-06-05 DIAGNOSIS — Z91041 Radiographic dye allergy status: Secondary | ICD-10-CM | POA: Diagnosis not present

## 2019-06-05 DIAGNOSIS — I480 Paroxysmal atrial fibrillation: Secondary | ICD-10-CM | POA: Diagnosis present

## 2019-06-05 DIAGNOSIS — I25708 Atherosclerosis of coronary artery bypass graft(s), unspecified, with other forms of angina pectoris: Secondary | ICD-10-CM

## 2019-06-05 DIAGNOSIS — Z951 Presence of aortocoronary bypass graft: Secondary | ICD-10-CM | POA: Diagnosis not present

## 2019-06-05 DIAGNOSIS — Z86010 Personal history of colonic polyps: Secondary | ICD-10-CM | POA: Diagnosis not present

## 2019-06-05 DIAGNOSIS — Z885 Allergy status to narcotic agent status: Secondary | ICD-10-CM | POA: Diagnosis not present

## 2019-06-05 DIAGNOSIS — I447 Left bundle-branch block, unspecified: Secondary | ICD-10-CM | POA: Diagnosis present

## 2019-06-05 DIAGNOSIS — E7800 Pure hypercholesterolemia, unspecified: Secondary | ICD-10-CM | POA: Diagnosis present

## 2019-06-05 DIAGNOSIS — R079 Chest pain, unspecified: Secondary | ICD-10-CM

## 2019-06-05 DIAGNOSIS — I132 Hypertensive heart and chronic kidney disease with heart failure and with stage 5 chronic kidney disease, or end stage renal disease: Secondary | ICD-10-CM | POA: Diagnosis present

## 2019-06-05 DIAGNOSIS — I209 Angina pectoris, unspecified: Secondary | ICD-10-CM

## 2019-06-05 DIAGNOSIS — I472 Ventricular tachycardia: Secondary | ICD-10-CM | POA: Diagnosis present

## 2019-06-05 DIAGNOSIS — D539 Nutritional anemia, unspecified: Secondary | ICD-10-CM | POA: Diagnosis present

## 2019-06-05 LAB — RENAL FUNCTION PANEL
Albumin: 2.9 g/dL — ABNORMAL LOW (ref 3.5–5.0)
Anion gap: 15 (ref 5–15)
BUN: 71 mg/dL — ABNORMAL HIGH (ref 8–23)
CO2: 25 mmol/L (ref 22–32)
Calcium: 9.4 mg/dL (ref 8.9–10.3)
Chloride: 94 mmol/L — ABNORMAL LOW (ref 98–111)
Creatinine, Ser: 9.37 mg/dL — ABNORMAL HIGH (ref 0.44–1.00)
GFR calc Af Amer: 4 mL/min — ABNORMAL LOW (ref >60)
GFR calc non Af Amer: 4 mL/min — ABNORMAL LOW (ref >60)
Glucose, Bld: 92 mg/dL (ref 70–99)
Phosphorus: 4.4 mg/dL (ref 2.5–4.6)
Potassium: 4.3 mmol/L (ref 3.5–5.1)
Sodium: 134 mmol/L — ABNORMAL LOW (ref 135–145)

## 2019-06-05 LAB — CBC
HCT: 27.1 % — ABNORMAL LOW (ref 36.0–46.0)
Hemoglobin: 8.4 g/dL — ABNORMAL LOW (ref 12.0–15.0)
MCH: 34.9 pg — ABNORMAL HIGH (ref 26.0–34.0)
MCHC: 31 g/dL (ref 30.0–36.0)
MCV: 112.4 fL — ABNORMAL HIGH (ref 80.0–100.0)
Platelets: 158 10*3/uL (ref 150–400)
RBC: 2.41 MIL/uL — ABNORMAL LOW (ref 3.87–5.11)
RDW: 17.9 % — ABNORMAL HIGH (ref 11.5–15.5)
WBC: 4.8 10*3/uL (ref 4.0–10.5)
nRBC: 0 % (ref 0.0–0.2)

## 2019-06-05 MED ORDER — LIDOCAINE-PRILOCAINE 2.5-2.5 % EX CREA: 1.0000 "application " | TOPICAL_CREAM | CUTANEOUS | Status: DC | PRN

## 2019-06-05 MED ORDER — SODIUM CHLORIDE 0.9 % IV SOLN: 100.0000 mL | INTRAVENOUS | Status: DC | PRN

## 2019-06-05 MED ORDER — PENTAFLUOROPROP-TETRAFLUOROETH EX AERO: 1.0000 "application " | INHALATION_SPRAY | CUTANEOUS | Status: DC | PRN

## 2019-06-05 MED ORDER — OMEPRAZOLE 20 MG PO CPDR: 40.0000 mg | DELAYED_RELEASE_CAPSULE | Freq: Two times a day (BID) | ORAL | 1 refills | Status: DC

## 2019-06-05 MED ORDER — CHLORHEXIDINE GLUCONATE CLOTH 2 % EX PADS: 6.0000 | MEDICATED_PAD | Freq: Every day | CUTANEOUS | Status: DC

## 2019-06-05 MED ORDER — LIDOCAINE HCL (PF) 1 % IJ SOLN: 5.0000 mL | INTRAMUSCULAR | Status: DC | PRN

## 2019-06-05 MED ORDER — ISOSORBIDE MONONITRATE ER 60 MG PO TB24: 60.0000 mg | ORAL_TABLET | Freq: Two times a day (BID) | ORAL | 1 refills | Status: DC

## 2019-06-05 MED ORDER — FELODIPINE ER 2.5 MG PO TB24: 2.5000 mg | ORAL_TABLET | Freq: Every day | ORAL | 1 refills | Status: DC

## 2019-06-05 MED ORDER — ROSUVASTATIN CALCIUM 10 MG PO TABS: 10.0000 mg | ORAL_TABLET | Freq: Every day | ORAL | 1 refills | Status: DC

## 2019-06-05 NOTE — Progress Notes (Addendum)
Progress Note  Patient Name: Shawna [name] Date of Encounter: 06/05/2019  Primary Cardiologist: Kate Sable, MD   Subjective   She denies any recurrent chest pain overnight or this morning. Breathing at baseline. Has been experiencing black, tarry stools.    Inpatient Medications    Scheduled Meds: . ALPRAZolam  0.5 mg Oral Daily  . amiodarone  200 mg Oral Daily  . carvedilol  12.5 mg Oral BID WC  . carvedilol  12.5 mg Oral Once  . cloNIDine  0.1 mg Oral BID  . famotidine  10 mg Oral Daily  . felodipine  2.5 mg Oral Daily  . hyoscyamine  0.125 mg Sublingual TID  . isosorbide mononitrate  60 mg Oral BID  . omeprazole  20 mg Oral BID AC  . rosuvastatin  10 mg Oral Daily  . sevelamer carbonate  1,600 mg Oral With snacks  . sevelamer carbonate  2,400 mg Oral TID WC  . sodium chloride flush  3 mL Intravenous Once  . ticagrelor  60 mg Oral BID   Continuous Infusions:  PRN Meds: nitroGLYCERIN, ondansetron (ZOFRAN) IV   Vital Signs    Vitals:   06/04/19 1450 06/04/19 1724 06/04/19 2128 06/05/19 0524  BP: (!) 108/35 (!) 127/41 (!) 139/45 (!) 147/53  Pulse: (!) 56 (!) 57 63 60  Resp: 16  18 18   Temp: 97.6 F (36.4 C)  98 F (36.7 C) 98.1 F (36.7 C)  TempSrc: Oral  Oral Oral  SpO2: 100%  98% 97%  Weight:      Height:        Intake/Output Summary (Last 24 hours) at 06/05/2019 0934 Last data filed at 06/04/2019 1000 Gross per 24 hour  Intake 200 ml  Output -  Net 200 ml    Last 3 Weights 06/03/2019 06/03/2019 05/17/2019  Weight (lbs) 126 lb 1.7 oz 135 lb 153 lb  Weight (kg) 57.2 kg 61.236 kg 69.4 kg      Telemetry    NSR, HR in mid-50's to 60's.  - Personally Reviewed  ECG    NSR, HR 65 with LVH and associated repol abnormalities which is similar to prior tracings.   - Personally Reviewed  Physical Exam   General: Well developed, well nourished, female appearing in no acute distress. Head: Normocephalic, atraumatic.  Neck: Supple without  bruits, JVD not elevated. Lungs:  Resp regular and unlabored, CTA without wheezing or rales. Heart: RRR, S1, S2, no S3, S4, or murmur; no rub. Abdomen: Soft, non-tender, non-distended with normoactive bowel sounds. No hepatomegaly. No rebound/guarding. No obvious abdominal masses. Extremities: No clubbing, cyanosis, or lower extremity edema. Distal pedal pulses are 2+ bilaterally. Neuro: Alert and oriented X 3. Moves all extremities spontaneously. Psych: Normal affect.  Labs    Chemistry Recent Labs  Lab 06/03/19 1556 06/04/19 0501 06/05/19 0550  NA 136 135 134*  K 4.0 4.0 4.3  CL 96* 97* 94*  CO2 29 26 25   GLUCOSE 89 73 92  BUN 35* 48* 71*  CREATININE 5.96* 6.97* 9.37*  CALCIUM 9.6 9.7 9.4  ALBUMIN  --   --  2.9*  GFRNONAA 6* 5* 4*  GFRAA 7* 6* 4*  ANIONGAP 11 12 15      Hematology Recent Labs  Lab 06/03/19 1556 06/04/19 0801 06/05/19 0550  WBC 9.1 4.4 4.8  RBC 2.87* 2.64* 2.41*  HGB 9.9* 9.1* 8.4*  HCT 32.5* 29.7* 27.1*  MCV 113.2* 112.5* 112.4*  MCH 34.5* 34.5* 34.9*  MCHC 30.5 30.6  31.0  RDW 18.1* 17.9* 17.9*  PLT 207 177 158    Cardiac EnzymesNo results for input(s): TROPONINI in the last 168 hours. No results for input(s): TROPIPOC in the last 168 hours.   BNPNo results for input(s): BNP, PROBNP in the last 168 hours.   DDimer No results for input(s): DDIMER in the last 168 hours.   Radiology    Dg Chest 2 View  Result Date: 06/03/2019 CLINICAL DATA:  Chest pain EXAM: CHEST - 2 VIEW COMPARISON:  March 08, 2019 FINDINGS: Linear scarring or atelectasis at the right lung base. Chronic blunting of the left costophrenic angle. Mild pulmonary vascular congestion. No new consolidation or edema. Stable cardiomediastinal contours with mild cardiomegaly. IMPRESSION: Mild pulmonary vascular congestion. Electronically Signed   By: Macy Mis M.D.   On: 06/03/2019 16:05    Cardiac Studies    Cardiac Catheterization: 07/2018  Ost RPDA to RPDA lesion is  75% stenosed.  Mid LM to Dist LM lesion is 60% stenosed.  Ost Ramus to Ramus lesion is 80% stenosed.  Ost Cx to Mid Cx lesion is 99% stenosed.  Prox LAD lesion is 90% stenosed.  Ost 1st Diag to 1st Diag lesion is 100% stenosed.  Ost RCA to Prox RCA lesion is 100% stenosed.  LIMA graft was visualized by angiography and is large.  The graft exhibits no disease.  SVG graft was visualized by angiography and is large.  The graft exhibits no disease.  SVG graft was visualized by angiography and is normal in caliber.  SVG graft was visualized by angiography and is large.  The graft exhibits no disease.  The left ventricular systolic function is normal.  LV end diastolic pressure is mildly elevated.  The left ventricular ejection fraction is 55-65% by visual estimate.   1. Severe 3 vessel occlusive CAD    - 60% distal left main    - 90% mid LAD    - 100% first diagonal at prior stent site    - 99% proximal LCx at prior stent site    - 80% ramus intermediate.    - 100% ostial RCA at prior stent site    - 75% proximal PDA 2. Patent LIMA to the LAD 3. Patent SVG to ramus intermediate 4. Patent SVG to OM 5. Patent SVG to the distal RCA. 6. Good LV function 7. Mildly elevated LVEDP  Plan: Compared to prior cath in April 2018 the diagonal is now occluded. There is progression of disease in the PDA. It is unclear if this is the reason for her chest pain. She is severely hypertensive and EDP is mildly elevated. I would recommend medical management with good BP control. If she has refractory angina despite good BP and volume management we could consider PCI of the PDA but this would have to be done through the SVG and in general she has not responded well to stenting in the past with all prior stents occluded. I will increase her Coreg dose. She will need dialysis in am.    Echocardiogram: 12/2018 IMPRESSIONS    1. The left ventricle has normal systolic function with an  ejection fraction of 60-65%. The cavity size was normal. There is mild concentric left ventricular hypertrophy. Left ventricular diastolic Doppler parameters are consistent with impaired  relaxation. Elevated left ventricular end-diastolic pressure No evidence of left ventricular regional wall motion abnormalities.  2. Left atrial size was mildly dilated.  3. The mitral valve is grossly normal. Mild thickening of the mitral  valve leaflet. There is mild mitral annular calcification present. Mitral valve regurgitation is mild to moderate by color flow Doppler.  4. The tricuspid valve is grossly normal.  5. The aortic valve is tricuspid. Mild thickening of the aortic valve.  6. Moderate aortic annular calcification.  7. The aortic root is normal in size and structure.  Patient Profile     78 y.o. female w/ PMH of CAD (s/p CABG in 2013, DES to D1 in 10/2016 with cath showing patent LIMA-LAD, SVG-RI, SVG-Mrg and SVG-RCA, cath in 07/2018 showing patent grafts with occlusion of D1 at prior stent site and progression of PDA disease --> medical management recommended), HTN, HLD, PAF (not on anticoagulation given history of GIB),NSVT (on Amiodarone)and ESRD who presented to [facility] ED on 06/03/2019 for evaluation of chest pain.   Assessment & Plan    1. Chest Pain/CAD - she has a history of known CAD with medical management recommended above following her most recent catheterization earlier this year. Initial and repeat HS Troponin values flat at 30 and 34. EKG without acute ST changes.  - attempts were made yesterday to titrate her medical therapy but options are limited due to her multiple medication intolerances. She has been continued on ASA, Brilinta, Coreg, Clonidine, and Imdur. She was not on ASA in the past but apparently self-resumed this in 03/2019. Given discussion with GI, she may require further studies with EGD or colonoscopy this admission. With no recent intervention, suspect Brilinta can  be held but will confirm with Dr. Bronson Ing. Once resuming, would favor a single agent over both ASA and Brilinta (intolerant to Plavix in the past). Plendil was added yesterday for antianginal benefit given intolerance to Amlodipine and inability to use Ranexa given her ESRD but unable to assess the efficacy of this as she has declined both doses given her SBP has been less than 160.  2. Paroxysmal Atrial Fibrillation/ History of NSVT - maintaining NSR this admission. She has been continued on Amiodarone 200mg  daily given prior episodes of NSVT. Has not been on anticoagulation for her atrial fibrillation secondary to history of recurrent GIB.   3. HTN - BP has been variable at 108/35 - 163/55 within the past 24 hours. She refused PM medications on 11/24 and AM medications today saying she will only take them if SBP is greater than 160. If she is following this regimen at home, suspect this is contributing to her episodes of angina when not taking her routine anti-anginal medications.   4. HLD - no recent FLP on file. She has been intolerant to high-intensity statin therapy in the past. Given interactions between CCB and Simvastatin, Simvastatin was stopped this admission and switched to Crestor 10mg  daily. She will need a repeat FLP and LFT's in 6-8 weeks.   5. ESRD - on HD - MWF schedule. Nephrology following this admission.   6. Melena - Hgb at 8.4 this AM. GI following with plans for    For questions or updates, please contact [place] Please consult [email or web address] for contact info under Cardiology/STEMI.   Arna Medici , PA-C 9:34 AM 06/05/2019 Pager: [phone]  The patient was seen and examined, and I agree with the history, physical exam, assessment and plan as documented above.  She has had no recurrence of chest pain after Plendil added yesterday.  Simvastatin switch to rosuvastatin to avoid drug interactions.  She is going to undergo endoscopy for GI  bleeding.  Brilinta can be discontinued altogether and she  can be discharged on 81 mg of aspirin daily once okay with GI.  Hemoglobin 8.4 this morning and had been 11.3 a month ago.  No further recommendations.  Kate Sable, MD, [facility]  06/05/2019 12:09 PM

## 2019-06-05 NOTE — Progress Notes (Signed)
Subjective: Has some weakness; denies dizziness or near syncope. Denies abdominal pain, N/V. Was previously having abdominal pain. Denies overt GERD symptoms. Had a dark/black stool yesterday. Previous black stoops per consult. States "I'm never going to take aspirin again." Denies other overt GI symptoms.  Objective: Vital signs in last 24 hours: Temp:  [97.6 F (36.4 C)-98.1 F (36.7 C)] 98.1 F (36.7 C) (11/25 0524) Pulse Rate:  [56-63] 60 (11/25 0524) Resp:  [16-18] 18 (11/25 0524) BP: (108-147)/(35-53) 147/53 (11/25 0524) SpO2:  [97 %-100 %] 97 % (11/25 0524) Last BM Date: 06/03/19 General:   Alert and oriented, pleasant Head:  Normocephalic and atraumatic. Eyes:  No icterus, sclera clear. Conjuctiva pink.  Neck:  Supple, without thyromegaly or masses.  Heart:  S1, S2 present, no murmurs noted.  Lungs: Clear to auscultation bilaterally, without wheezing, rales, or rhonchi.  Abdomen:  Bowel sounds present, soft, non-tender, non-distended. No HSM or hernias noted. No rebound or guarding. No masses appreciated  Msk:  Symmetrical without gross deformities. Pulses:  Normal bilateral DP pulses noted. Extremities:  Without clubbing or edema. Neurologic:  Alert and  oriented x4;  grossly normal neurologically. Psych:  Alert and cooperative. Normal mood and affect.  Intake/Output from previous day: 11/24 0701 - 11/25 0700 In: 200 [P.O.:200] Out: -  Intake/Output this shift: No intake/output data recorded.  Lab Results: Recent Labs    06/03/19 1556 06/04/19 0801 06/05/19 0550  WBC 9.1 4.4 4.8  HGB 9.9* 9.1* 8.4*  HCT 32.5* 29.7* 27.1*  PLT 207 177 158   BMET Recent Labs    06/03/19 1556 06/04/19 0501 06/05/19 0550  NA 136 135 134*  K 4.0 4.0 4.3  CL 96* 97* 94*  CO2 29 26 25   GLUCOSE 89 73 92  BUN 35* 48* 71*  CREATININE 5.96* 6.97* 9.37*  CALCIUM 9.6 9.7 9.4   LFT Recent Labs    06/05/19 0550  ALBUMIN 2.9*   PT/INR No results for input(s): LABPROT,  INR in the last 72 hours. Hepatitis Panel No results for input(s): HEPBSAG, HCVAB, HEPAIGM, HEPBIGM in the last 72 hours.   Studies/Results: Dg Chest 2 View  Result Date: 06/03/2019 CLINICAL DATA:  Chest pain EXAM: CHEST - 2 VIEW COMPARISON:  March 08, 2019 FINDINGS: Linear scarring or atelectasis at the right lung base. Chronic blunting of the left costophrenic angle. Mild pulmonary vascular congestion. No new consolidation or edema. Stable cardiomediastinal contours with mild cardiomegaly. IMPRESSION: Mild pulmonary vascular congestion. Electronically Signed   By: Macy Mis M.D.   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  Cardiology has seen patient during admission with plans for medical management.  They do not anticipate further ischemic testing with plans to observe today following medication changes and reevaluate.  Melena: On admission, hemoglobin 9.9 > 9.1 yesterday. Overall, this is down from 11.3 on 05/02/2019.  Over the last week, she has been having 2-4 melenotic stools a day, 1 episode of melena yesterday. No other significant upper or lower GI symptoms. On omeprazole daily outpatient. This has been a chronic intermittent issue at least since 2014 with APC therapy in 2014 and 2018. 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 Suspect recurrent melena is likely secondary to duodenal and jejunal AVMs in the setting of Brilinta and aspirin.  Patient was advised to discontinue aspirin and only continue Brilinta  at her last visit with Dr. Laural Golden on 05/14/2019.  She had discontinued aspirin until a couple of weeks ago when she resumed taking it due to chest pain at which point she restarted ASA. Suspect resuming aspirin likely contributed to recurrence of GI bleed. Today denies significant ongoing symptoms, no stool in the past 12-24 hours. Some hgb drift this morning from 9.1 > 8.4.  Discussed possibility of EGD/enteroscopy. Cardiology ok to hold Brilenta 3-5 days for procedure; long-term discussion needed for ongoing need for Brilenta (per Cardiology on it for medical management, no overt ischemic event).  Will plan to treat supportively and transfuse as necessary in the meantime. Trying to get patient started back on omeprazole and pharmacy working on this (Protonix allergy). Limited ability to adjust Pepcid due to hemodialysis.  Plan: 1. Hold Brilenta x 5 days 2. Can restart as needed if going to d/c home 3. GI discussion with cardiology in the future for need of Brilenta with history of recurrent GI bleed (though I feel ASA more likely culprit) 4. Continue to try and get Omeprazole PPI therapy (Home meds vs pharmacy) 5. Supportive measures   Thank you for allowing [place] to participate in the care of [place], DNP, AGNP-C Adult & Gerontological Nurse Practitioner [facility]     LOS: 0 days    06/05/2019, 8:08 AM

## 2019-06-05 NOTE — Progress Notes (Signed)
Stated had large black bm last night

## 2019-06-05 NOTE — Procedures (Addendum)
    HEMODIALYSIS NURSING NOTE:  I was unable to cannulate the arterial aspect of pt's AV access.  Failed attempts x3.  HD RN from [place] to come later today to attempt cannulation.   Cannulated successfully by Ronny Bacon, RN.  3.5 hour heparin-free treatment completed without incident.  Goal met: 1 liter removed.  All blood was returned and hemostasis was achieved in 20 minutes.   Rockwell Alexandria, RN

## 2019-06-05 NOTE — Addendum Note (Signed)
Encounter addended by: Franky Macho, [place] on: 06/05/2019 9:50 PM  Actions taken: Order Reconciliation Section accessed

## 2019-06-05 NOTE — Discharge Summary (Signed)
Physician Discharge Summary  Shawna [name] AOZ:[id number] DOB: 1940/12/04 DOA: 06/03/2019  PCP: [name], MD  Admit date: 06/03/2019 Discharge date: 06/05/2019  Admitted From: Home Disposition:  Home   Recommendations for Outpatient Follow-up:  1. Follow up with PCP in 1-2 weeks 2. Please obtain BMP/CBC in one week     Discharge Condition: Stable CODE STATUS: FULL Diet recommendation: Heart Healthy   Brief/Interim Summary: 78 y/o femalewith medical history ofESRD(MWF)who normally dialyzes at [facility] in [place], diastolic CHF, hypertension, hyperlipidemia, colonic AVMs, TIA, coronary artery disease presenting with 1 to 2-week history of intermittent chest pain.  She has a difficult historian, but states that she has been taking nitroglycerin which time she gets the chest pain with some relief of her symptoms.  She has had some shortness of breath but denies any nausea, diaphoresis, or syncope.  She states that she has the chest pain at rest, but it is also brought on by some activity.  She was at dialysis on 06/03/2019 when she developed chest pain once again.  She completed 3-1/2 hours of dialysis.  Because of her chest pain, the patient was sent to the emergency department for further evaluation.  Notably, the patient frequently adjusts her own antihypertensive medications without notifying her physicians.  However, she claims that she has been mostly compliant.  She denies any fevers, chills, coughing, hemoptysis, hematemesis, abdominal pain, hematochezia. The patient does note some melena over the past week.  She denies any NSAID use. In the emergency department, the patient was afebrile hemodynamically stable saturating 100% room air.  Blood pressure was as high as 209/77.  BMP was essentially unremarkable except for serum creatinine 6.97.  Hemoglobin was 9.9 which is near her baseline.  EKG shows sinus rhythm with incomplete left bundle branch block.  Chest x-ray showed increased  interstitial markings.  Discharge Diagnoses:  Chest pain/coronary artery disease/Angina -She has typical and atypical symptoms -Suspect that this is partly precipitated by the patient's uncontrolled blood pressure with which she has had some question of compliance with her antihypertensive medications -She has had a long history of adjusting her own antihypertensive therapy -troponins flat, no suggestive ACS -personally reviewed EKG--sinus, LVH, ILBBB -Cardiac catheterization performed back in January 2020 revealed severe multivessel native CAD with patent LIMA to the LAD, patent SVG to the ramus intermedius, patent SVG to the obtuse marginal, and patent SVG to the distal RCA. New findings at that time included a diagonal occlusion and progressive disease in the PDA, although these were felt to be best managed medically and wouldrequire fairly complex PCI if attempted. -continue ASA monotherapy--stop Brillinta ok per cardiology -continue Coreg and imdur -Plendil 2.5 mg added -split imdur dosing to 60 mg bid rather than 120 mg daily  Melena/Chronic Blood Loss Anemia/SB AVMs -Suspect recurrent melena is likely secondary to duodenal and jejunal AVMs in the setting of Brilinta and aspirin -Hgb stable -baseline Hgb ~10 -GI consult appreciated -Givens capsule on 8/27/2020for evaluation of melenawith bulbar duodenitis without stigmata of bleeding, jejunal AVMs without stigmata of bleeding, and jejunal diverticulum without stigmata of bleed. -colonoscopy on 03/09/2019 with removal of 2 small tubular adenomas along with large polyp from transverse colon which was snared piecemeal and residual polyp ablated with APC. -discussed with cardiology-->ok to stop Brillinta and continue ASA 81 mg monotherapy -discussed with GI--ok to d/c with ASA 81 mg with omeprazole and allow for Brillinta washout-->outpt follow with Dr. Laural Golden for possible EGD/enteroscopy  ESRD -pt had 3.5hrs HD on 06/03/19 -plan  HD  on 06/05/19 and discharge after HD  Paroxysmal Afib/NSVT -Not on AC due to GIB -CHADS VASc--5 -continue amio and coreg  Uncontrolled HTN -pt has long hx of adjusting her own anti-HTN regimen -continue coreg, clonidine, imdur  Hyperlipidemia -continue statin -simvastatin changed to rosuvastatin to avoid drug-drug interaction with CCB  Anxiety -continue alprazolam     Discharge Instructions   Allergies as of 06/05/2019      Reactions   Aspirin Other (See Comments)   High Doses Mess up her stomach; "makes my bowels have blood in them". Takes 81 mg EC Aspirin    Penicillins Other (See Comments)   SYNCOPE? , "makes me real weak when I take it; like I'll pass out" Has patient had a PCN reaction causing immediate rash, facial/tongue/throat swelling, SOB or lightheadedness with hypotension: Yes Has patient had a PCN reaction causing severe rash involving mucus membranes or skin necrosis: no Has patient had a PCN reaction that required hospitalization no Has patient had a PCN reaction occurring within the last 10 years: no If all of the above   Amlodipine Swelling   Bactrim [sulfamethoxazole-trimethoprim] Rash   Contrast Media [iodinated Diagnostic Agents] Itching   Iron Itching, Other (See Comments)   "they gave me iron in dialysis; had to give me Benadryl cause I had to have the iron" (05/02/2012)   Nitrofurantoin Hives   Tylenol [acetaminophen] Itching, Other (See Comments)   Makes her feet on fire per pt   Gabapentin Other (See Comments)   Unknown reaction   Hydralazine Itching   Dexilant [dexlansoprazole] Other (See Comments)   Upset stomach   Levaquin [levofloxacin In D5w] Rash   Morphine And Related Itching, Other (See Comments)   Itching in feet   Plavix [clopidogrel Bisulfate] Rash   Protonix [pantoprazole Sodium] Rash   Venofer [ferric Oxide] Itching, Other (See Comments)   Patient reports using Benadryl prior to doses as [place]      Medication  List    STOP taking these medications   metroNIDAZOLE 250 MG tablet Commonly known as: FLAGYL   simvastatin 20 MG tablet Commonly known as: ZOCOR   ticagrelor 60 MG Tabs tablet Commonly known as: Brilinta     TAKE these medications   ALPRAZolam 0.5 MG tablet Commonly known as: XANAX Take 0.5 mg by mouth daily.   amiodarone 200 MG tablet Commonly known as: PACERONE Take 1 tablet (200 mg total) by mouth daily.   aspirin EC 81 MG tablet Take 1 tablet (81 mg total) by mouth daily. Restart on 03/13/19   benzonatate 100 MG capsule Commonly known as: TESSALON TAKE 1 CAPSULE BY MOUTH THREE TIMES DAILY AS NEEDED FOR COUGH   carvedilol 12.5 MG tablet Commonly known as: COREG Take 1 tablet (12.5 mg total) by mouth 2 (two) times daily with a meal. Additional refills per PCP   cloNIDine 0.1 MG tablet Commonly known as: Catapres Take 1 tablet (0.1 mg total) by mouth 2 (two) times daily.   diphenhydrAMINE 25 mg capsule Commonly known as: BENADRYL Take 50 mg by mouth daily as needed for itching or allergies.   felodipine 2.5 MG 24 hr tablet Commonly known as: PLENDIL Take 1 tablet (2.5 mg total) by mouth daily. Start taking on: June 06, 2019   fluticasone 50 MCG/ACT nasal spray Commonly known as: FLONASE Place 1 spray at bedtime as needed into both nostrils for allergies.   hyoscyamine 0.125 MG SL tablet Commonly known as: LEVSIN SL Place 1 tablet (0.125  mg total) under the tongue 3 (three) times daily.   isosorbide mononitrate 60 MG 24 hr tablet Commonly known as: IMDUR Take 1 tablet (60 mg total) by mouth 2 (two) times daily. What changed:   medication strength  how much to take  when to take this   lidocaine-prilocaine cream Commonly known as: EMLA Apply 1 application topically every Monday, Wednesday, and Friday. Prior to dialysis   loratadine 10 MG tablet Commonly known as: CLARITIN Take 1 tablet (10 mg total) by mouth daily as needed for allergies.     multivitamin Tabs tablet Take 1 tablet by mouth daily.   nitroGLYCERIN 0.4 MG SL tablet Commonly known as: NITROSTAT DISSOLVE ONE TABLET UNDER THE TONGUE EVERY 5 MINUTES AS NEEDED FOR CHEST PAIN.  DO NOT EXCEED A TOTAL OF 3 DOSES IN 15 MINUTES   nystatin ointment Commonly known as: MYCOSTATIN APPLY A SMALL AMOUNT TOPICALLY TWICE DAILY FOR 7 DAYS TO EXTERNAL ANAL AREA FOR ANAL ITCHINESS   omeprazole 20 MG capsule Commonly known as: PRILOSEC Take 2 capsules (40 mg total) by mouth 2 (two) times daily before a meal. What changed:   how much to take  when to take this   ondansetron 4 MG disintegrating tablet Commonly known as: ZOFRAN-ODT DISSOLVE 1 TABLET IN MOUTH TWICE DAILY AS NEEDED   rosuvastatin 10 MG tablet Commonly known as: CRESTOR Take 1 tablet (10 mg total) by mouth daily.   sevelamer carbonate 800 MG tablet Commonly known as: RENVELA TAKE 3 TABLETS BY MOUTH THREE TIMES DAILY WITH MEALS AND 2 TABLETS WITH SNACKS       Allergies  Allergen Reactions   Aspirin Other (See Comments)    High Doses Mess up her stomach; "makes my bowels have blood in them". Takes 81 mg EC Aspirin    Penicillins Other (See Comments)    SYNCOPE? , "makes me real weak when I take it; like I'll pass out"  Has patient had a PCN reaction causing immediate rash, facial/tongue/throat swelling, SOB or lightheadedness with hypotension: Yes Has patient had a PCN reaction causing severe rash involving mucus membranes or skin necrosis: no Has patient had a PCN reaction that required hospitalization no Has patient had a PCN reaction occurring within the last 10 years: no If all of the above   Amlodipine Swelling   Bactrim [Sulfamethoxazole-Trimethoprim] Rash   Contrast Media [Iodinated Diagnostic Agents] Itching   Iron Itching and Other (See Comments)    "they gave me iron in dialysis; had to give me Benadryl cause I had to have the iron" (05/02/2012)   Nitrofurantoin Hives   Tylenol  [Acetaminophen] Itching and Other (See Comments)    Makes her feet on fire per pt   Gabapentin Other (See Comments)    Unknown reaction   Hydralazine Itching   Dexilant [Dexlansoprazole] Other (See Comments)    Upset stomach   Levaquin [Levofloxacin In D5w] Rash   Morphine And Related Itching and Other (See Comments)    Itching in feet   Plavix [Clopidogrel Bisulfate] Rash   Protonix [Pantoprazole Sodium] Rash   Venofer [Ferric Oxide] Itching and Other (See Comments)    Patient reports using Benadryl prior to doses as [place]    Consultations:  Renal  GI   Procedures/Studies: Dg Chest 2 View  Result Date: 06/03/2019 CLINICAL DATA:  Chest pain EXAM: CHEST - 2 VIEW COMPARISON:  March 08, 2019 FINDINGS: Linear scarring or atelectasis at the right lung base. Chronic blunting of the left  costophrenic angle. Mild pulmonary vascular congestion. No new consolidation or edema. Stable cardiomediastinal contours with mild cardiomegaly. IMPRESSION: Mild pulmonary vascular congestion. Electronically Signed   By: Macy Mis M.D.   On: 06/03/2019 16:05         Discharge Exam: Vitals:   06/05/19 0524 06/05/19 0852  BP: (!) 147/53 (!) 163/55  Pulse: 60 68  Resp: 18   Temp: 98.1 F (36.7 C)   SpO2: 97%    Vitals:   06/04/19 1724 06/04/19 2128 06/05/19 0524 06/05/19 0852  BP: (!) 127/41 (!) 139/45 (!) 147/53 (!) 163/55  Pulse: (!) 57 63 60 68  Resp:  18 18   Temp:  98 F (36.7 C) 98.1 F (36.7 C)   TempSrc:  Oral Oral   SpO2:  98% 97%   Weight:      Height:        General: Pt is alert, awake, not in acute distress Cardiovascular: RRR, S1/S2 +, no rubs, no gallops Respiratory: CTA bilaterally, no wheezing, no rhonchi Abdominal: Soft, NT, ND, bowel sounds + Extremities: no edema, no cyanosis   The results of significant diagnostics from this hospitalization (including imaging, microbiology, ancillary and laboratory) are listed below for reference.     Significant Diagnostic Studies: Dg Chest 2 View  Result Date: 06/03/2019 CLINICAL DATA:  Chest pain EXAM: CHEST - 2 VIEW COMPARISON:  March 08, 2019 FINDINGS: Linear scarring or atelectasis at the right lung base. Chronic blunting of the left costophrenic angle. Mild pulmonary vascular congestion. No new consolidation or edema. Stable cardiomediastinal contours with mild cardiomegaly. IMPRESSION: Mild pulmonary vascular congestion. Electronically Signed   By: Macy Mis M.D.   On: 06/03/2019 16:05     Microbiology: Recent Results (from the past 240 hour(s))  SARS CORONAVIRUS 2 (Alec Jaros 6-24 HRS) Nasopharyngeal Nasopharyngeal Swab     Status: None   Collection Time: 06/03/19  9:24 PM   Specimen: Nasopharyngeal Swab  Result Value Ref Range Status   SARS Coronavirus 2 NEGATIVE NEGATIVE Final    Comment: (NOTE) SARS-CoV-2 target nucleic acids are NOT DETECTED. The SARS-CoV-2 RNA is generally detectable in upper and lower respiratory specimens during the acute phase of infection. Negative results do not preclude SARS-CoV-2 infection, do not rule out co-infections with other pathogens, and should not be used as the sole basis for treatment or other patient management decisions. Negative results must be combined with clinical observations, patient history, and epidemiological information. The expected result is Negative. Fact Sheet for Patients: SugarRoll.be Fact Sheet for Healthcare Providers: https://www.woods-mathews.com/ This test is not yet approved or cleared by the [place] FDA and  has been authorized for detection and/or diagnosis of SARS-CoV-2 by FDA under an Emergency Use Authorization (EUA). This EUA will remain  in effect (meaning this test can be used) for the duration of the COVID-19 declaration under Section 56 4(b)(1) of the Act, 21 U.S.C. section 360bbb-3(b)(1), unless the authorization is terminated or revoked  sooner. Performed at [facility], [place] [address] [place], [place] [address]   MRSA PCR Screening     Status: None   Collection Time: 06/04/19  2:11 AM   Specimen: Nasopharyngeal  Result Value Ref Range Status   MRSA by PCR NEGATIVE NEGATIVE Final    Comment:        The GeneXpert MRSA Assay (FDA approved for NASAL specimens only), is one component of a comprehensive MRSA colonization surveillance program. It is not intended to diagnose MRSA infection nor to guide  or monitor treatment for MRSA infections. Performed at [facility], [address] [place], [place] [address]      Labs: Basic Metabolic Panel: Recent Labs  Lab 06/03/19 1556 06/04/19 0501 06/05/19 0550  NA 136 135 134*  K 4.0 4.0 4.3  CL 96* 97* 94*  CO2 29 26 25   GLUCOSE 89 73 92  BUN 35* 48* 71*  CREATININE 5.96* 6.97* 9.37*  CALCIUM 9.6 9.7 9.4  PHOS  --   --  4.4   Liver Function Tests: Recent Labs  Lab 06/05/19 0550  ALBUMIN 2.9*   No results for input(s): LIPASE, AMYLASE in the last 168 hours. No results for input(s): AMMONIA in the last 168 hours. CBC: Recent Labs  Lab 06/03/19 1556 06/04/19 0801 06/05/19 0550  WBC 9.1 4.4 4.8  HGB 9.9* 9.1* 8.4*  HCT 32.5* 29.7* 27.1*  MCV 113.2* 112.5* 112.4*  PLT 207 177 158   Cardiac Enzymes: No results for input(s): CKTOTAL, CKMB, CKMBINDEX, TROPONINI in the last 168 hours. BNP: Invalid input(s): POCBNP CBG: No results for input(s): GLUCAP in the last 168 hours.  Time coordinating discharge:  36 minutes  Signed:  Orson Eva, DO [facility] Pager: [phone] 06/05/2019, 4:53 PM

## 2019-06-05 NOTE — Consult Note (Signed)
[facility] Nephrology consult note: Reason for Consult: To manage dialysis and dialysis related needs Referring Physician: Dr Orson Eva  HPI:  Shawna [name] is an 78 y.o. female with history of HTN, HLD, CAD, ESRD on HD, GI bleed due to AVM who is now seen in consultation at the request of Dr. Carles Collet for the management of ESRD and dialysis.  Patient receives dialysis MWF at [facility].  On Monday, the dialysis was terminated 1 hour earlier because of chest pain.  She presented with around 2 weeks of intermittent chest pain.  Denied shortness of breath, palpitation, nausea vomiting.  She reports getting around 3 and half hour of treatment.  Seen by cardiologist recommended no further invasive procedure at this time.  GI is also following for possible GI bleeding.  She is sitting on bed comfortable.  Blood pressure 147/53, oxygen saturation 97 in room air.  The labs showed potassium 4.3, CO2 25, calcium 9.4, phosphorus 4.4, hemoglobin 8.4-9.1.  Chest x-ray showed mild pulmonary vascular congestion.  Past Medical History:  Diagnosis Date  . Acute on chronic respiratory failure with hypoxia ([place]) 10/10/2016  . Anxiety   . Arthritis   . AVM (arteriovenous malformation) of colon   . CAD (coronary artery disease)    a. s/p CABG in 2013 b. most recent DES to D1 in 10/2016 with cath showing patent LIMA-LAD, SVG-RI, SVG-Mrg and SVG-RCA  c. NST in 02/2018 showing scar with no significant ischemia  . Carotid artery disease ([place])    a. 61-44% LICA, 09/1538   . Chronic bronchitis ([place])   . Chronic diastolic CHF (congestive heart failure) ([place])    a. 02/2012 Echo EF 60-65%, nl wall motion, Gr 1 DD, mod MR  . Colon cancer ([place]) 1992  . Esophageal stricture   . ESRD on hemodialysis ([place])    ESRD due to HTN, started dialysis 2011 and gets HD at [facility] with Dr Hinda Lenis on MWF schedule.  Access is LUA AVF as of Sept 2014.   Marland Kitchen GERD (gastroesophageal reflux disease)   . High  cholesterol 12/2011  . History of blood transfusion 07/2011; 12/2011; 01/2012 X 2; 04/2012  . History of gout   . History of lower GI bleeding   . Hypertension   . Iron deficiency anemia   . Mitral regurgitation    a. Moderate by echo, 02/2012  . Myocardial infarction ([place])   . Ovarian cancer ([place]) 1992  . Pneumonia ~ 2009  . PUD (peptic ulcer disease)   . TIA (transient ischemic attack)     Past Surgical History:  Procedure Laterality Date  . A/V SHUNTOGRAM Left 03/19/2019   Procedure: A/V SHUNTOGRAM;  Surgeon: Katha Cabal, MD;  Location: [place] [facility];  Service: Cardiovascular;  Laterality: Left;  . ABDOMINAL HYSTERECTOMY  1992  . APPENDECTOMY  06/1990  . AV FISTULA PLACEMENT  07/2009   left upper arm  . AV FISTULA PLACEMENT Right 09/06/2016   Procedure: RIGHT FOREARM ARTERIOVENOUS (AV) GRAFT;  Surgeon: Elam Dutch, MD;  Location: [facility] OR;  Service: Vascular;  Laterality: Right;  . AV FISTULA PLACEMENT N/A 02/24/2017   Procedure: INSERTION OF ARTERIOVENOUS (AV) GORE-TEX GRAFT ARM (BRACHIAL AXILLARY);  Surgeon: Katha Cabal, MD;  Location: [facility] ORS;  Service: Vascular;  Laterality: N/A;  . [place] Right 09/06/2016   Procedure: REMOVAL OF Right Arm ARTERIOVENOUS GORETEX GRAFT and Vein Patch angioplasty of brachial artery;  Surgeon: Angelia Mould, MD;  Location: [place];  Service: Vascular;  Laterality: Right;  . COLON RESECTION  1992  . COLON SURGERY    . COLONOSCOPY N/A 03/09/2019   Procedure: COLONOSCOPY;  Surgeon: Rogene Houston, MD;  [facility];  Service: Endoscopy;  Laterality: N/A;  . CORONARY ANGIOPLASTY WITH STENT PLACEMENT  12/15/11   "2"  . CORONARY ANGIOPLASTY WITH STENT PLACEMENT  y/2013   "1; makes total of 3" (05/02/2012)  . CORONARY ARTERY BYPASS GRAFT  06/13/2012   Procedure: CORONARY ARTERY BYPASS GRAFTING (CABG);  Surgeon: Grace Isaac, MD;  Location: [place];  Service: Open Heart Surgery;  Laterality: N/A;  cabg x four;   using left internal mammary artery, and left leg greater saphenous vein harvested endoscopically  . CORONARY STENT INTERVENTION N/A 10/13/2016   Procedure: Coronary Stent Intervention;  Surgeon: Troy Sine, MD;  Location: [place] [facility] LAB;  Service: Cardiovascular;  Laterality: N/A;  . DIALYSIS/PERMA CATHETER REMOVAL N/A 04/18/2017   Procedure: DIALYSIS/PERMA CATHETER REMOVAL;  Surgeon: Katha Cabal, MD;  Location: [place] [facility];  Service: Cardiovascular;  Laterality: N/A;  . DILATION AND CURETTAGE OF UTERUS    . ESOPHAGOGASTRODUODENOSCOPY  01/20/2012   Procedure: ESOPHAGOGASTRODUODENOSCOPY (EGD);  Surgeon: Ladene Artist, MD,FACG;  Location: [facility] ENDOSCOPY;  Service: Endoscopy;  Laterality: N/A;  . ESOPHAGOGASTRODUODENOSCOPY N/A 03/26/2013   Procedure: ESOPHAGOGASTRODUODENOSCOPY (EGD);  Surgeon: Irene Shipper, MD;  Location: [facility] ENDOSCOPY;  Service: Endoscopy;  Laterality: N/A;  . ESOPHAGOGASTRODUODENOSCOPY N/A 04/30/2015   Procedure: ESOPHAGOGASTRODUODENOSCOPY (EGD);  Surgeon: Rogene Houston, MD;  [facility];  Service: Endoscopy;  Laterality: N/A;  1pm - moved to 10/20 @ 1:10  . ESOPHAGOGASTRODUODENOSCOPY N/A 07/29/2016   Procedure: ESOPHAGOGASTRODUODENOSCOPY (EGD);  Surgeon: Manus Gunning, MD;  Location: [place];  Service: Gastroenterology;  Laterality: N/A;  enteroscopy  . GIVENS CAPSULE STUDY N/A 03/07/2019   Procedure: GIVENS CAPSULE STUDY;  Surgeon: Rogene Houston, MD;  [facility];  Service: Endoscopy;  Laterality: N/A;  7:30  . INTRAOPERATIVE TRANSESOPHAGEAL ECHOCARDIOGRAM  06/13/2012   Procedure: INTRAOPERATIVE TRANSESOPHAGEAL ECHOCARDIOGRAM;  Surgeon: Grace Isaac, MD;  Location: [place];  Service: Open Heart Surgery;  Laterality: N/A;  . IR GENERIC HISTORICAL  07/26/2016   IR FLUORO GUIDE CV LINE RIGHT 07/26/2016 Greggory Keen, MD [facility]  . IR GENERIC HISTORICAL  07/26/2016   IR US GUIDE VASC ACCESS RIGHT 07/26/2016 Greggory Keen, MD [facility]  . IR GENERIC HISTORICAL  08/02/2016   IR US GUIDE VASC ACCESS RIGHT 08/02/2016 Greggory Keen, MD [facility]  . IR GENERIC HISTORICAL  08/02/2016   IR FLUORO GUIDE CV LINE RIGHT 08/02/2016 Greggory Keen, MD [facility]  . IR RADIOLOGY PERIPHERAL GUIDED IV START  03/28/2017  . IR US GUIDE VASC ACCESS RIGHT  03/28/2017  . LEFT HEART CATH AND CORONARY ANGIOGRAPHY N/A 09/20/2016   Procedure: Left Heart Cath and Coronary Angiography;  Surgeon: [name], MD;  Location: [place] [facility];  Service: Cardiovascular;  Laterality: N/A;  . LEFT HEART CATH AND CORS/GRAFTS ANGIOGRAPHY N/A 10/13/2016   Procedure: Left Heart Cath and Cors/Grafts Angiography;  Surgeon: Troy Sine, MD;  Location: [place] [facility];  Service: Cardiovascular;  Laterality: N/A;  . LEFT HEART CATH AND CORS/GRAFTS ANGIOGRAPHY N/A 07/13/2018   Procedure: LEFT HEART CATH AND CORS/GRAFTS ANGIOGRAPHY;  Surgeon: Martinique, Peter M, MD;  Location: [place] [facility] LAB;  Service: Cardiovascular;  Laterality: N/A;  . LEFT HEART CATHETERIZATION WITH CORONARY ANGIOGRAM N/A 12/15/2011  Procedure: LEFT HEART CATHETERIZATION WITH CORONARY ANGIOGRAM;  Surgeon: Burnell Blanks, MD;  Location: [facility] CATH LAB;  Service: Cardiovascular;  Laterality: N/A;  . LEFT HEART CATHETERIZATION WITH CORONARY ANGIOGRAM N/A 01/10/2012   Procedure: LEFT HEART CATHETERIZATION WITH CORONARY ANGIOGRAM;  Surgeon: Peter M Martinique, MD;  Location: [facility] CATH LAB;  Service: Cardiovascular;  Laterality: N/A;  . LEFT HEART CATHETERIZATION WITH CORONARY ANGIOGRAM N/A 06/08/2012   Procedure: LEFT HEART CATHETERIZATION WITH CORONARY ANGIOGRAM;  Surgeon: Burnell Blanks, MD;  Location: [facility] CATH LAB;  Service: Cardiovascular;  Laterality: N/A;  . LEFT HEART CATHETERIZATION WITH CORONARY/GRAFT ANGIOGRAM N/A 12/10/2013   Procedure: LEFT HEART CATHETERIZATION WITH Beatrix Fetters;  Surgeon: [name], MD;  Location: [facility] CATH LAB;  Service:  Cardiovascular;  Laterality: N/A;  . OVARY SURGERY     ovarian cancer  . POLYPECTOMY  03/09/2019   Procedure: POLYPECTOMY;  Surgeon: Rogene Houston, MD;  [facility];  Service: Endoscopy;;  cecal   . REVISION OF ARTERIOVENOUS GORETEX GRAFT N/A 02/24/2017   Procedure: REVISION OF ARTERIOVENOUS GORETEX GRAFT (RESECTION);  Surgeon: Katha Cabal, MD;  Location: [facility] ORS;  Service: Vascular;  Laterality: N/A;  [place] Mallet N/A 10/15/2013   Procedure: Fistulogram;  Surgeon: Serafina Mitchell, MD;  Location: [facility] CATH LAB;  Service: Cardiovascular;  Laterality: N/A;  . THROMBECTOMY / ARTERIOVENOUS GRAFT REVISION  2011   left upper arm  . TUBAL LIGATION  1980's  . UPPER EXTREMITY ANGIOGRAPHY Bilateral 12/06/2016   Procedure: Upper Extremity Angiography;  Surgeon: Katha Cabal, MD;  Location: [place] [facility] LAB;  Service: Cardiovascular;  Laterality: Bilateral;  . UPPER EXTREMITY INTERVENTION Left 06/06/2017   Procedure: UPPER EXTREMITY INTERVENTION;  Surgeon: Katha Cabal, MD;  Location: [place] [facility];  Service: Cardiovascular;  Laterality: Left;    Family History  Problem Relation Age of Onset  . Heart disease Mother        Heart Disease before age 80  . Hyperlipidemia Mother   . Hypertension Mother   . Diabetes Mother   . Heart attack Mother   . Heart disease Father        Heart Disease before age 79  . Hyperlipidemia Father   . Hypertension Father   . Diabetes Father   . Diabetes Sister   . Hypertension Sister   . Diabetes Brother   . Hyperlipidemia Brother   . Heart attack Brother   . Hypertension Sister   . Heart attack Brother   . Colon cancer Child 19  . Other Other        noncontributory for early CAD  . Esophageal cancer Neg Hx   . Liver disease Neg Hx   . Kidney disease Neg Hx   . Colon polyps Neg Hx     Social History:  reports that she has never smoked. She has never used smokeless tobacco. She reports that she does not drink alcohol  or use drugs.  Allergies:  Allergies  Allergen Reactions  . Aspirin Other (See Comments)    High Doses Mess up her stomach; "makes my bowels have blood in them". Takes 81 mg EC Aspirin   . Penicillins Other (See Comments)    SYNCOPE? , "makes me real weak when I take it; like I'll pass out"  Has patient had a PCN reaction causing immediate rash, facial/tongue/throat swelling, SOB or lightheadedness with hypotension: Yes Has patient had a PCN reaction causing severe rash involving mucus membranes or skin necrosis: no  Has patient had a PCN reaction that required hospitalization no Has patient had a PCN reaction occurring within the last 10 years: no If all of the above  . Amlodipine Swelling  . Bactrim [Sulfamethoxazole-Trimethoprim] Rash  . Contrast Media [Iodinated Diagnostic Agents] Itching  . Iron Itching and Other (See Comments)    "they gave me iron in dialysis; had to give me Benadryl cause I had to have the iron" (05/02/2012)  . Nitrofurantoin Hives  . Tylenol [Acetaminophen] Itching and Other (See Comments)    Makes her feet on fire per pt  . Gabapentin Other (See Comments)    Unknown reaction  . Hydralazine Itching  . Dexilant [Dexlansoprazole] Other (See Comments)    Upset stomach  . Levaquin [Levofloxacin In D5w] Rash  . Morphine And Related Itching and Other (See Comments)    Itching in feet  . Plavix [Clopidogrel Bisulfate] Rash  . Protonix [Pantoprazole Sodium] Rash  . Venofer [Ferric Oxide] Itching and Other (See Comments)    Patient reports using Benadryl prior to doses as [facility]    Medications: I have reviewed the patient's current medications.   Results for orders placed or performed during the hospital encounter of 06/03/19 (from the past 48 hour(s))  Basic metabolic panel     Status: Abnormal   Collection Time: 06/03/19  3:56 PM  Result Value Ref Range   Sodium 136 135 - 145 mmol/L   Potassium 4.0 3.5 - 5.1 mmol/L   Chloride 96 (L) 98 - 111  mmol/L   CO2 29 22 - 32 mmol/L   Glucose, Bld 89 70 - 99 mg/dL   BUN 35 (H) 8 - 23 mg/dL   Creatinine, Ser 5.96 (H) 0.44 - 1.00 mg/dL   Calcium 9.6 8.9 - 10.3 mg/dL   GFR calc non Af Amer 6 (L) >60 mL/min   GFR calc Af Amer 7 (L) >60 mL/min   Anion gap 11 5 - 15    Comment: Performed at [facility], [address], [place], [place] [address]  CBC     Status: Abnormal   Collection Time: 06/03/19  3:56 PM  Result Value Ref Range   WBC 9.1 4.0 - 10.5 K/uL   RBC 2.87 (L) 3.87 - 5.11 MIL/uL   Hemoglobin 9.9 (L) 12.0 - 15.0 g/dL   HCT 32.5 (L) 36.0 - 46.0 %   MCV 113.2 (H) 80.0 - 100.0 fL   MCH 34.5 (H) 26.0 - 34.0 pg   MCHC 30.5 30.0 - 36.0 g/dL   RDW 18.1 (H) 11.5 - 15.5 %   Platelets 207 150 - 400 K/uL   nRBC 0.0 0.0 - 0.2 %    Comment: Performed at [facility], [address] [place], [place] [address]  Troponin I (High Sensitivity)     Status: Abnormal   Collection Time: 06/03/19  3:56 PM  Result Value Ref Range   Troponin I (High Sensitivity) 30 (H) <18 ng/L    Comment: (NOTE) Elevated high sensitivity troponin I (hsTnI) values and significant  changes across serial measurements may suggest ACS but many other  chronic and acute conditions are known to elevate hsTnI results.  Refer to the "Links" section for chest pain algorithms and additional  guidance. Performed at [facility], [address], [place], [place] [address]   Troponin I (High Sensitivity)     Status: Abnormal   Collection Time: 06/03/19  6:40 PM  Result Value Ref Range   Troponin I (High Sensitivity) 34 (  H) <18 ng/L    Comment: (NOTE) Elevated high sensitivity troponin I (hsTnI) values and significant  changes across serial measurements may suggest ACS but many other  chronic and acute conditions are known to elevate hsTnI results.  Refer to the "Links" section for chest pain algorithms and additional  guidance. Performed at [facility], [address], [place], [place] [address]   POC SARS Coronavirus  2 Ag-ED - Nasal Swab (BD Veritor Kit)     Status: None   Collection Time: 06/03/19  8:20 PM  Result Value Ref Range   SARS Coronavirus 2 Ag NEGATIVE NEGATIVE    Comment: (NOTE) SARS-CoV-2 antigen NOT DETECTED.  Negative results are presumptive.  Negative results do not preclude SARS-CoV-2 infection and should not be used as the sole basis for treatment or other patient management decisions, including infection  control decisions, particularly in the presence of clinical signs and  symptoms consistent with COVID-19, or in those who have been in contact with the virus.  Negative results must be combined with clinical observations, patient history, and epidemiological information. The expected result is Negative. Fact Sheet for Patients: PodPark.tn Fact Sheet for Healthcare Providers: GiftContent.is This test is not yet approved or cleared by the [place] FDA and  has been authorized for detection and/or diagnosis of SARS-CoV-2 by FDA under an Emergency Use Authorization (EUA).  This EUA will remain in effect (meaning this test can be used) for the duration of  the COVID-19 de claration under Section 564(b)(1) of the Act, 21 U.S.C. section 360bbb-3(b)(1), unless the authorization is terminated or revoked sooner.   SARS CORONAVIRUS 2 (TAT 6-24 HRS) Nasopharyngeal Nasopharyngeal Swab     Status: None   Collection Time: 06/03/19  9:24 PM   Specimen: Nasopharyngeal Swab  Result Value Ref Range   SARS Coronavirus 2 NEGATIVE NEGATIVE    Comment: (NOTE) SARS-CoV-2 target nucleic acids are NOT DETECTED. The SARS-CoV-2 RNA is generally detectable in upper and lower respiratory specimens during the acute phase of infection. Negative results do not preclude SARS-CoV-2 infection, do not rule out co-infections with other pathogens, and should not be used as the sole basis for treatment or other patient management decisions. Negative  results must be combined with clinical observations, patient history, and epidemiological information. The expected result is Negative. Fact Sheet for Patients: SugarRoll.be Fact Sheet for Healthcare Providers: https://www.woods-mathews.com/ This test is not yet approved or cleared by the [place] FDA and  has been authorized for detection and/or diagnosis of SARS-CoV-2 by FDA under an Emergency Use Authorization (EUA). This EUA will remain  in effect (meaning this test can be used) for the duration of the COVID-19 declaration under Section 56 4(b)(1) of the Act, 21 U.S.C. section 360bbb-3(b)(1), unless the authorization is terminated or revoked sooner. Performed at [facility], [place] [address] [place], [place] [address]   MRSA PCR Screening     Status: None   Collection Time: 06/04/19  2:11 AM   Specimen: Nasopharyngeal  Result Value Ref Range   MRSA by PCR NEGATIVE NEGATIVE    Comment:        The GeneXpert MRSA Assay (FDA approved for NASAL specimens only), is one component of a comprehensive MRSA colonization surveillance program. It is not intended to diagnose MRSA infection nor to guide or monitor treatment for MRSA infections. Performed at [facility], [address], [place], [place] [address]   Basic metabolic panel     Status: Abnormal   Collection Time:  06/04/19  5:01 AM  Result Value Ref Range   Sodium 135 135 - 145 mmol/L   Potassium 4.0 3.5 - 5.1 mmol/L   Chloride 97 (L) 98 - 111 mmol/L   CO2 26 22 - 32 mmol/L   Glucose, Bld 73 70 - 99 mg/dL   BUN 48 (H) 8 - 23 mg/dL   Creatinine, Ser 6.97 (H) 0.44 - 1.00 mg/dL   Calcium 9.7 8.9 - 10.3 mg/dL   GFR calc non Af Amer 5 (L) >60 mL/min   GFR calc Af Amer 6 (L) >60 mL/min   Anion gap 12 5 - 15    Comment: Performed at [facility], [address], [place], [place] [address]  CBC     Status: Abnormal   Collection Time: 06/04/19  8:01 AM  Result Value Ref  Range   WBC 4.4 4.0 - 10.5 K/uL   RBC 2.64 (L) 3.87 - 5.11 MIL/uL   Hemoglobin 9.1 (L) 12.0 - 15.0 g/dL   HCT 29.7 (L) 36.0 - 46.0 %   MCV 112.5 (H) 80.0 - 100.0 fL   MCH 34.5 (H) 26.0 - 34.0 pg   MCHC 30.6 30.0 - 36.0 g/dL   RDW 17.9 (H) 11.5 - 15.5 %   Platelets 177 150 - 400 K/uL   nRBC 0.0 0.0 - 0.2 %    Comment: Performed at [facility], [address], [place], [place] [address]  Renal function panel     Status: Abnormal   Collection Time: 06/05/19  5:50 AM  Result Value Ref Range   Sodium 134 (L) 135 - 145 mmol/L   Potassium 4.3 3.5 - 5.1 mmol/L   Chloride 94 (L) 98 - 111 mmol/L   CO2 25 22 - 32 mmol/L   Glucose, Bld 92 70 - 99 mg/dL   BUN 71 (H) 8 - 23 mg/dL   Creatinine, Ser 9.37 (H) 0.44 - 1.00 mg/dL   Calcium 9.4 8.9 - 10.3 mg/dL   Phosphorus 4.4 2.5 - 4.6 mg/dL   Albumin 2.9 (L) 3.5 - 5.0 g/dL   GFR calc non Af Amer 4 (L) >60 mL/min   GFR calc Af Amer 4 (L) >60 mL/min   Anion gap 15 5 - 15    Comment: Performed at [facility], [address] [place], [place] [address]  CBC     Status: Abnormal   Collection Time: 06/05/19  5:50 AM  Result Value Ref Range   WBC 4.8 4.0 - 10.5 K/uL   RBC 2.41 (L) 3.87 - 5.11 MIL/uL   Hemoglobin 8.4 (L) 12.0 - 15.0 g/dL   HCT 27.1 (L) 36.0 - 46.0 %   MCV 112.4 (H) 80.0 - 100.0 fL   MCH 34.9 (H) 26.0 - 34.0 pg   MCHC 31.0 30.0 - 36.0 g/dL   RDW 17.9 (H) 11.5 - 15.5 %   Platelets 158 150 - 400 K/uL   nRBC 0.0 0.0 - 0.2 %    Comment: Performed at [facility], [address] [place], [place] [address]    Dg Chest 2 View  Result Date: 06/03/2019 CLINICAL DATA:  Chest pain EXAM: CHEST - 2 VIEW COMPARISON:  March 08, 2019 FINDINGS: Linear scarring or atelectasis at the right lung base. Chronic blunting of the left costophrenic angle. Mild pulmonary vascular congestion. No new consolidation or edema. Stable cardiomediastinal contours with mild cardiomegaly. IMPRESSION: Mild pulmonary vascular congestion. Electronically Signed   By:  Macy Mis M.D.   On: 06/03/2019 16:05  ROS: As per H&P.  Other systems reviewed and are negative. Blood pressure (!) 147/53, pulse 60, temperature 98.1 F (36.7 C), temperature source Oral, resp. rate 18, height 5' 1"  (1.549 m), weight 57.2 kg, SpO2 97 %. General: Sitting on bed comfortable, not in distress Neck: No JVD distention, no mass Respiratory: Clear bilateral, no wheezing or crackle, no increased work of breathing Cardiovascular: Regular rate rhythm, S1-S2 normal, no rub or murmur GI: Abdomen soft, nontender, nondistended Extremity: No edema, no cyanosis Neurology: Alert, awake, following commands, no asterixis Skin: No rash or ulcer Vascular Access: Left upper extremity AV fistula with good thrill and bruit.  Assessment/Plan: 1 recurrent chest pain: Seen by cardiologist no plan for ischemic testing.  Patient denies chest pain today.  2 ESRD: MWF at [place]: Plan for HD today as per her schedule.  Volume status looks acceptable.  aVF for the access.  3 Hypertension: Blood pressure acceptable.  No edema.  4. Anemia of ESRD and possibly GI bleed: GI is following.  May need ESA.  We will obtain outpatient record if patient stays in the hospital further.  5. Metabolic Bone Disease: Phosphorus level acceptable.  Continue [place].  Thank you for the consult.  Discussed the primary team and dialysis nurse.   Tanna Furry 06/05/2019, 9:36 AM

## 2019-06-10 ENCOUNTER — Ambulatory Visit (INDEPENDENT_AMBULATORY_CARE_PROVIDER_SITE_OTHER): Payer: Medicare HMO | Admitting: Internal Medicine

## 2019-06-13 ENCOUNTER — Encounter: Payer: Self-pay | Admitting: Cardiovascular Disease

## 2019-06-13 ENCOUNTER — Ambulatory Visit (INDEPENDENT_AMBULATORY_CARE_PROVIDER_SITE_OTHER): Payer: Medicare HMO | Admitting: Cardiovascular Disease

## 2019-06-13 VITALS — BP 148/72 | HR 70 | Ht 61.0 in | Wt 126.8 lb

## 2019-06-13 DIAGNOSIS — I25708 Atherosclerosis of coronary artery bypass graft(s), unspecified, with other forms of angina pectoris: Secondary | ICD-10-CM

## 2019-06-13 DIAGNOSIS — N186 End stage renal disease: Secondary | ICD-10-CM

## 2019-06-13 DIAGNOSIS — I472 Ventricular tachycardia, unspecified: Secondary | ICD-10-CM

## 2019-06-13 DIAGNOSIS — I480 Paroxysmal atrial fibrillation: Secondary | ICD-10-CM

## 2019-06-13 DIAGNOSIS — K922 Gastrointestinal hemorrhage, unspecified: Secondary | ICD-10-CM

## 2019-06-13 DIAGNOSIS — I10 Essential (primary) hypertension: Secondary | ICD-10-CM

## 2019-06-13 DIAGNOSIS — E785 Hyperlipidemia, unspecified: Secondary | ICD-10-CM

## 2019-06-13 NOTE — Progress Notes (Signed)
SUBJECTIVE: The patient presents for posthospitalization follow-up for chest pain and anemia secondary to GI bleeding.  Brilinta was discontinued.  Plendil was added.  I last saw her in the hospital on 06/05/2019.  She has a long history of noncompliance with antihypertensive therapy and adjusted on her own at home.  She has been scheduled for outpatient follow-up with GI for possible EGD/enteroscopy.  Most recent hemoglobin 8.4 on 06/05/2019.  She is doing well overall.  She denies any bright red blood per rectum.  She had an episode of chest pain yesterday and took 1 nitroglycerin and an aspirin and it resolved.  She is only been taking carvedilol once daily.  Blood pressures continue to fluctuate.      Review of Systems: As per "subjective", otherwise negative.  Allergies  Allergen Reactions  . Aspirin Other (See Comments)    High Doses Mess up her stomach; "makes my bowels have blood in them". Takes 81 mg EC Aspirin   . Penicillins Other (See Comments)    SYNCOPE? , "makes me real weak when I take it; like I'll pass out"  Has patient had a PCN reaction causing immediate rash, facial/tongue/throat swelling, SOB or lightheadedness with hypotension: Yes Has patient had a PCN reaction causing severe rash involving mucus membranes or skin necrosis: no Has patient had a PCN reaction that required hospitalization no Has patient had a PCN reaction occurring within the last 10 years: no If all of the above  . Amlodipine Swelling  . Bactrim [Sulfamethoxazole-Trimethoprim] Rash  . Contrast Media [Iodinated Diagnostic Agents] Itching  . Iron Itching and Other (See Comments)    "they gave me iron in dialysis; had to give me Benadryl cause I had to have the iron" (05/02/2012)  . Nitrofurantoin Hives  . Tylenol [Acetaminophen] Itching and Other (See Comments)    Makes her feet on fire per pt  . Gabapentin Other (See Comments)    Unknown reaction  . Hydralazine Itching  .  Dexilant [Dexlansoprazole] Other (See Comments)    Upset stomach  . Levaquin [Levofloxacin In D5w] Rash  . Morphine And Related Itching and Other (See Comments)    Itching in feet  . Plavix [Clopidogrel Bisulfate] Rash  . Protonix [Pantoprazole Sodium] Rash  . Venofer [Ferric Oxide] Itching and Other (See Comments)    Patient reports using Benadryl prior to doses as [facility]    Current Outpatient Medications  Medication Sig Dispense Refill  . ALPRAZolam (XANAX) 0.5 MG tablet Take 0.5 mg by mouth daily.    Marland Kitchen amiodarone (PACERONE) 200 MG tablet Take 1 tablet (200 mg total) by mouth daily. 90 tablet 1  . aspirin EC 81 MG tablet Take 1 tablet (81 mg total) by mouth daily. Restart on 03/13/19    . benzonatate (TESSALON) 100 MG capsule TAKE 1 CAPSULE BY MOUTH THREE TIMES DAILY AS NEEDED FOR COUGH    . carvedilol (COREG) 12.5 MG tablet Take 1 tablet (12.5 mg total) by mouth 2 (two) times daily with a meal. Additional refills per PCP (Patient taking differently: Take 12.5 mg by mouth every morning. Additional refills per PCP) 180 tablet 1  . cloNIDine (CATAPRES) 0.1 MG tablet Take 1 tablet (0.1 mg total) by mouth 2 (two) times daily. (Patient taking differently: Take 0.1 mg by mouth 2 (two) times daily as needed. ) 60 tablet 2  . diphenhydrAMINE (BENADRYL) 25 mg capsule Take 50 mg by mouth daily as needed for itching or allergies.     Marland Kitchen  felodipine (PLENDIL) 2.5 MG 24 hr tablet Take 1 tablet (2.5 mg total) by mouth daily. 30 tablet 1  . fluticasone (FLONASE) 50 MCG/ACT nasal spray Place 1 spray at bedtime as needed into both nostrils for allergies.     Marland Kitchen gabapentin (NEURONTIN) 100 MG capsule Take 100 mg by mouth at bedtime.    . hyoscyamine (LEVSIN SL) 0.125 MG SL tablet Place 1 tablet (0.125 mg total) under the tongue 3 (three) times daily. 30 tablet 0  . isosorbide mononitrate (IMDUR) 60 MG 24 hr tablet Take 1 tablet (60 mg total) by mouth 2 (two) times daily. 60 tablet 1  .  lidocaine-prilocaine (EMLA) cream Apply 1 application topically every Monday, Wednesday, and Friday. Prior to dialysis    . loratadine (CLARITIN) 10 MG tablet Take 1 tablet (10 mg total) by mouth daily as needed for allergies. 30 tablet 0  . multivitamin (RENA-VIT) TABS tablet Take 1 tablet by mouth daily.    . nitroGLYCERIN (NITROSTAT) 0.4 MG SL tablet DISSOLVE ONE TABLET UNDER THE TONGUE EVERY 5 MINUTES AS NEEDED FOR CHEST PAIN.  DO NOT EXCEED A TOTAL OF 3 DOSES IN 15 MINUTES 25 tablet 2  . omeprazole (PRILOSEC) 20 MG capsule Take 2 capsules (40 mg total) by mouth 2 (two) times daily before a meal. 60 capsule 1  . ondansetron (ZOFRAN-ODT) 4 MG disintegrating tablet DISSOLVE 1 TABLET IN MOUTH TWICE DAILY AS NEEDED    . rosuvastatin (CRESTOR) 10 MG tablet Take 1 tablet (10 mg total) by mouth daily. 30 tablet 1  . sevelamer carbonate (RENVELA) 800 MG tablet TAKE 3 TABLETS BY MOUTH THREE TIMES DAILY WITH MEALS AND 2 TABLETS WITH SNACKS    . nystatin ointment (MYCOSTATIN) APPLY A SMALL AMOUNT TOPICALLY TWICE DAILY FOR 7 DAYS TO EXTERNAL ANAL AREA FOR ANAL ITCHINESS     No current facility-administered medications for this visit.     Past Medical History:  Diagnosis Date  . Acute on chronic respiratory failure with hypoxia ([place]) 10/10/2016  . Anxiety   . Arthritis   . AVM (arteriovenous malformation) of colon   . CAD (coronary artery disease)    a. s/p CABG in 2013 b. most recent DES to D1 in 10/2016 with cath showing patent LIMA-LAD, SVG-RI, SVG-Mrg and SVG-RCA  c. NST in 02/2018 showing scar with no significant ischemia  . Carotid artery disease ([place])    a. 40-97% LICA, 09/5327   . Chronic bronchitis ([place])   . Chronic diastolic CHF (congestive heart failure) ([place])    a. 02/2012 Echo EF 60-65%, nl wall motion, Gr 1 DD, mod MR  . Colon cancer ([place]) 1992  . Esophageal stricture   . ESRD on hemodialysis ([place])    ESRD due to HTN, started dialysis 2011 and gets HD at [facility] with Dr Hinda Lenis  on MWF schedule.  Access is LUA AVF as of Sept 2014.   Marland Kitchen GERD (gastroesophageal reflux disease)   . High cholesterol 12/2011  . History of blood transfusion 07/2011; 12/2011; 01/2012 X 2; 04/2012  . History of gout   . History of lower GI bleeding   . Hypertension   . Iron deficiency anemia   . Mitral regurgitation    a. Moderate by echo, 02/2012  . Myocardial infarction ([place])   . Ovarian cancer ([place]) 1992  . Pneumonia ~ 2009  . PUD (peptic ulcer disease)   . TIA (transient ischemic attack)     Past Surgical History:  Procedure Laterality Date  .  A/V SHUNTOGRAM Left 03/19/2019   Procedure: A/V SHUNTOGRAM;  Surgeon: Katha Cabal, MD;  Location: [place] [facility];  Service: Cardiovascular;  Laterality: Left;  . ABDOMINAL HYSTERECTOMY  1992  . APPENDECTOMY  06/1990  . AV FISTULA PLACEMENT  07/2009   left upper arm  . AV FISTULA PLACEMENT Right 09/06/2016   Procedure: RIGHT FOREARM ARTERIOVENOUS (AV) GRAFT;  Surgeon: Elam Dutch, MD;  Location: [facility] OR;  Service: Vascular;  Laterality: Right;  . AV FISTULA PLACEMENT N/A 02/24/2017   Procedure: INSERTION OF ARTERIOVENOUS (AV) GORE-TEX GRAFT ARM (BRACHIAL AXILLARY);  Surgeon: Katha Cabal, MD;  Location: [facility] ORS;  Service: Vascular;  Laterality: N/A;  . [place] Right 09/06/2016   Procedure: REMOVAL OF Right Arm ARTERIOVENOUS GORETEX GRAFT and Vein Patch angioplasty of brachial artery;  Surgeon: Angelia Mould, MD;  Location: [place];  Service: Vascular;  Laterality: Right;  . COLON RESECTION  1992  . COLON SURGERY    . COLONOSCOPY N/A 03/09/2019   Procedure: COLONOSCOPY;  Surgeon: Rogene Houston, MD;  [facility];  Service: Endoscopy;  Laterality: N/A;  . CORONARY ANGIOPLASTY WITH STENT PLACEMENT  12/15/11   "2"  . CORONARY ANGIOPLASTY WITH STENT PLACEMENT  y/2013   "1; makes total of 3" (05/02/2012)  . CORONARY ARTERY BYPASS GRAFT  06/13/2012   Procedure: CORONARY ARTERY BYPASS GRAFTING (CABG);  Surgeon:  Grace Isaac, MD;  Location: [place];  Service: Open Heart Surgery;  Laterality: N/A;  cabg x four;  using left internal mammary artery, and left leg greater saphenous vein harvested endoscopically  . CORONARY STENT INTERVENTION N/A 10/13/2016   Procedure: Coronary Stent Intervention;  Surgeon: Troy Sine, MD;  Location: [place] [facility];  Service: Cardiovascular;  Laterality: N/A;  . DIALYSIS/PERMA CATHETER REMOVAL N/A 04/18/2017   Procedure: DIALYSIS/PERMA CATHETER REMOVAL;  Surgeon: Katha Cabal, MD;  Location: [place] [facility];  Service: Cardiovascular;  Laterality: N/A;  . DILATION AND CURETTAGE OF UTERUS    . ESOPHAGOGASTRODUODENOSCOPY  01/20/2012   Procedure: ESOPHAGOGASTRODUODENOSCOPY (EGD);  Surgeon: Ladene Artist, MD,FACG;  Location: [facility] ENDOSCOPY;  Service: Endoscopy;  Laterality: N/A;  . ESOPHAGOGASTRODUODENOSCOPY N/A 03/26/2013   Procedure: ESOPHAGOGASTRODUODENOSCOPY (EGD);  Surgeon: Irene Shipper, MD;  Location: [facility] ENDOSCOPY;  Service: Endoscopy;  Laterality: N/A;  . ESOPHAGOGASTRODUODENOSCOPY N/A 04/30/2015   Procedure: ESOPHAGOGASTRODUODENOSCOPY (EGD);  Surgeon: Rogene Houston, MD;  [facility];  Service: Endoscopy;  Laterality: N/A;  1pm - moved to 10/20 @ 1:10  . ESOPHAGOGASTRODUODENOSCOPY N/A 07/29/2016   Procedure: ESOPHAGOGASTRODUODENOSCOPY (EGD);  Surgeon: Manus Gunning, MD;  Location: [place];  Service: Gastroenterology;  Laterality: N/A;  enteroscopy  . GIVENS CAPSULE STUDY N/A 03/07/2019   Procedure: GIVENS CAPSULE STUDY;  Surgeon: Rogene Houston, MD;  [facility];  Service: Endoscopy;  Laterality: N/A;  7:30  . INTRAOPERATIVE TRANSESOPHAGEAL ECHOCARDIOGRAM  06/13/2012   Procedure: INTRAOPERATIVE TRANSESOPHAGEAL ECHOCARDIOGRAM;  Surgeon: Grace Isaac, MD;  Location: [place];  Service: Open Heart Surgery;  Laterality: N/A;  . IR GENERIC HISTORICAL  07/26/2016   IR FLUORO GUIDE CV LINE RIGHT 07/26/2016 Greggory Keen, MD  [facility]  . IR GENERIC HISTORICAL  07/26/2016   IR US GUIDE VASC ACCESS RIGHT 07/26/2016 Greggory Keen, MD [facility]  . IR GENERIC HISTORICAL  08/02/2016   IR US GUIDE VASC ACCESS RIGHT 08/02/2016 Greggory Keen, MD [facility]  . IR GENERIC HISTORICAL  08/02/2016   IR FLUORO GUIDE  CV LINE RIGHT 08/02/2016 Greggory Keen, MD [facility]  . IR RADIOLOGY PERIPHERAL GUIDED IV START  03/28/2017  . IR US GUIDE VASC ACCESS RIGHT  03/28/2017  . LEFT HEART CATH AND CORONARY ANGIOGRAPHY N/A 09/20/2016   Procedure: Left Heart Cath and Coronary Angiography;  Surgeon: [name], MD;  Location: [place] [facility];  Service: Cardiovascular;  Laterality: N/A;  . LEFT HEART CATH AND CORS/GRAFTS ANGIOGRAPHY N/A 10/13/2016   Procedure: Left Heart Cath and Cors/Grafts Angiography;  Surgeon: Troy Sine, MD;  Location: [place] [facility];  Service: Cardiovascular;  Laterality: N/A;  . LEFT HEART CATH AND CORS/GRAFTS ANGIOGRAPHY N/A 07/13/2018   Procedure: LEFT HEART CATH AND CORS/GRAFTS ANGIOGRAPHY;  Surgeon: Martinique, Peter M, MD;  Location: [place] [facility];  Service: Cardiovascular;  Laterality: N/A;  . LEFT HEART CATHETERIZATION WITH CORONARY ANGIOGRAM N/A 12/15/2011   Procedure: LEFT HEART CATHETERIZATION WITH CORONARY ANGIOGRAM;  Surgeon: Burnell Blanks, MD;  Location: [facility] CATH LAB;  Service: Cardiovascular;  Laterality: N/A;  . LEFT HEART CATHETERIZATION WITH CORONARY ANGIOGRAM N/A 01/10/2012   Procedure: LEFT HEART CATHETERIZATION WITH CORONARY ANGIOGRAM;  Surgeon: Peter M Martinique, MD;  Location: [facility] CATH LAB;  Service: Cardiovascular;  Laterality: N/A;  . LEFT HEART CATHETERIZATION WITH CORONARY ANGIOGRAM N/A 06/08/2012   Procedure: LEFT HEART CATHETERIZATION WITH CORONARY ANGIOGRAM;  Surgeon: Burnell Blanks, MD;  Location: [facility] CATH LAB;  Service: Cardiovascular;  Laterality: N/A;  . LEFT HEART CATHETERIZATION WITH CORONARY/GRAFT ANGIOGRAM N/A 12/10/2013   Procedure: LEFT HEART CATHETERIZATION  WITH Beatrix Fetters;  Surgeon: [name], MD;  Location: [facility] CATH LAB;  Service: Cardiovascular;  Laterality: N/A;  . OVARY SURGERY     ovarian cancer  . POLYPECTOMY  03/09/2019   Procedure: POLYPECTOMY;  Surgeon: Rogene Houston, MD;  [facility];  Service: Endoscopy;;  cecal   . REVISION OF ARTERIOVENOUS GORETEX GRAFT N/A 02/24/2017   Procedure: REVISION OF ARTERIOVENOUS GORETEX GRAFT (RESECTION);  Surgeon: Katha Cabal, MD;  Location: [facility] ORS;  Service: Vascular;  Laterality: N/A;  [place] N/A 10/15/2013   Procedure: Fistulogram;  Surgeon: Serafina Mitchell, MD;  [facility] CATH LAB;  Service: Cardiovascular;  Laterality: N/A;  . THROMBECTOMY / ARTERIOVENOUS GRAFT REVISION  2011   left upper arm  . TUBAL LIGATION  1980's  . UPPER EXTREMITY ANGIOGRAPHY Bilateral 12/06/2016   Procedure: Upper Extremity Angiography;  Surgeon: Katha Cabal, MD;  Location: [place] [facility];  Service: Cardiovascular;  Laterality: Bilateral;  . UPPER EXTREMITY INTERVENTION Left 06/06/2017   Procedure: UPPER EXTREMITY INTERVENTION;  Surgeon: Katha Cabal, MD;  Location: [place] [facility];  Service: Cardiovascular;  Laterality: Left;    Social History   Socioeconomic History  . Marital status: Married    Spouse name: [name]  . Number of children: Not on file  . Years of education: Not on file  . Highest education level: Not on file  Occupational History  . Not on file  Social Needs  . Financial resource strain: Not very hard  . Food insecurity    Worry: Never true    Inability: Never true  . Transportation needs    Medical: No    Non-medical: No  Tobacco Use  . Smoking status: Never Smoker  . Smokeless tobacco: Never Used  Substance and Sexual Activity  . Alcohol use: No    Alcohol/week: 0.0 standard drinks  . Drug use: No  . Sexual activity: Yes  Birth control/protection: Surgical  Lifestyle  . Physical activity    Days per  week: Patient refused    Minutes per session: Patient refused  . Stress: Only a little  Relationships  . Social Herbalist on phone: Patient refused    Gets together: Patient refused    Attends religious service: Patient refused    Active member of club or organization: Patient refused    Attends meetings of clubs or organizations: Patient refused    Relationship status: Patient refused  . Intimate partner violence    Fear of current or ex partner: No    Emotionally abused: No    Physically abused: No    Forced sexual activity: No  Other Topics Concern  . Not on file  Social History Narrative   Lives in [place], [place] with husband.  Dialysis pt - [facility].     Vitals:   06/13/19 1009  BP: (!) 148/72  Pulse: 70  SpO2: 99%  Weight: 126 lb 12.8 oz (57.5 kg)  Height: 5\' 1"  (1.549 m)    Wt Readings from Last 3 Encounters:  06/13/19 126 lb 12.8 oz (57.5 kg)  06/05/19 130 lb 11.7 oz (59.3 kg)  05/17/19 153 lb (69.4 kg)     PHYSICAL EXAM General: NAD HEENT: Normal. Neck: No JVD, no thyromegaly. Lungs: Clear to auscultation bilaterally with normal respiratory effort. CV: Regular rate and rhythm, normal S1/S2, no S3/+S4, no murmur. No pretibial or periankle edema.  Abdomen: Soft, nontender, no distention.  Neurologic: Alert and oriented.  Psych: Normal affect. Skin: Normal. Musculoskeletal: No gross deformities.    Cardiac Catheterization: 07/2018  Ost RPDA to RPDA lesion is 75% stenosed.  Mid LM to Dist LM lesion is 60% stenosed.  Ost Ramus to Ramus lesion is 80% stenosed.  Ost Cx to Mid Cx lesion is 99% stenosed.  Prox LAD lesion is 90% stenosed.  Ost 1st Diag to 1st Diag lesion is 100% stenosed.  Ost RCA to Prox RCA lesion is 100% stenosed.  LIMA graft was visualized by angiography and is large.  The graft exhibits no disease.  SVG graft was visualized by angiography and is large.  The graft exhibits no disease.  SVG graft was visualized by  angiography and is normal in caliber.  SVG graft was visualized by angiography and is large.  The graft exhibits no disease.  The left ventricular systolic function is normal.  LV end diastolic pressure is mildly elevated.  The left ventricular ejection fraction is 55-65% by visual estimate.  1. Severe 3 vessel occlusive CAD - 60% distal left main - 90% mid LAD - 100% first diagonal at prior stent site - 99% proximal LCx at prior stent site - 80% ramus intermediate. - 100% ostial RCA at prior stent site - 75% proximal PDA 2. Patent LIMA to the LAD 3. Patent SVG to ramus intermediate 4. Patent SVG to OM 5. Patent SVG to the distal RCA. 6. Good LV function 7. Mildly elevated LVEDP  Plan: Compared to prior cath in April 2018 the diagonal is now occluded. There is progression of disease in the PDA. It is unclear if this is the reason for her chest pain. She is severely hypertensive and EDP is mildly elevated. I would recommend medical management with good BP control. If she has refractory angina despite good BP and volume management we could consider PCI of the PDA but this would have to be done through the SVG and in general she has  not responded well to stenting in the past with all prior stents occluded. I will increase her Coreg dose. She will need dialysis in am.    Echocardiogram: 12/2018 IMPRESSIONS   1. The left ventricle has normal systolic function with an ejection fraction of 60-65%. The cavity size was normal. There is mild concentric left ventricular hypertrophy. Left ventricular diastolic Doppler parameters are consistent with impaired  relaxation. Elevated left ventricular end-diastolic pressure No evidence of left ventricular regional wall motion abnormalities. 2. Left atrial size was mildly dilated. 3. The mitral valve is grossly normal. Mild thickening of the mitral valve leaflet. There is mild mitral annular calcification present.  Mitral valve regurgitation is mild to moderate by color flow Doppler. 4. The tricuspid valve is grossly normal. 5. The aortic valve is tricuspid. Mild thickening of the aortic valve. 6. Moderate aortic annular calcification. 7. The aortic root is normal in size and structure.  Labs: Lab Results  Component Value Date/Time   K 4.3 06/05/2019 05:50 AM   BUN 71 (H) 06/05/2019 05:50 AM   CREATININE 9.37 (H) 06/05/2019 05:50 AM   CREATININE 6.57 (H) 03/05/2019 11:59 AM   ALT 9 05/17/2019 06:33 PM   TSH 1.536 05/19/2016 11:13 AM   TSH 2.864 08/24/2014 10:00 AM   HGB 8.4 (L) 06/05/2019 05:50 AM     Lipids: Lab Results  Component Value Date/Time   LDLCALC 26 10/16/2017 05:39 AM   CHOL 93 10/16/2017 05:39 AM   TRIG 56 10/16/2017 05:39 AM   HDL 56 10/16/2017 05:39 AM       ASSESSMENT AND PLAN: 1.  Coronary artery disease: Cardiac catheterization from January 2020 reviewed above with occlusion of diagonal and progression of PDA disease.  Continue medical therapy with aspirin, rosuvastatin, Plendil, Imdur and carvedilol.  Brilinta discontinued during recent hospitalization due to GI bleeding and consequent anemia.  She has been intolerant of Plavix in the past.  2.  Accelerated hypertension: She has a long history of adjusting antihypertensive therapy on her own.  BP is mildly elevated.  This will need further monitoring.  3.  End-stage renal disease: On hemodialysis.  4.  Hyperlipidemia: Continue statin therapy.  5.  History of paroxysmal atrial fibrillation/nonsustained ventricular tachycardia: Currently on amiodarone.  Not on systemic anticoagulation due to prior GI bleed.  6.  History of GI bleed/anemia: Hemoglobin 8.4 on 06/05/2019.  She will follow-up with GI for possible EGD/enteroscopy    Disposition: Follow up 6 months   Kate Sable, M.D., F.A.C.C.

## 2019-06-13 NOTE — Patient Instructions (Signed)
Medication Instructions:  Continue all current medications.  Labwork: none  Testing/Procedures: none  Follow-Up: Your physician wants you to follow up in: 6 months.  You will receive a reminder letter in the mail one-two months in advance.  If you don't receive a letter, please call our office to schedule the follow up appointment   Any Other Special Instructions Will Be Listed Below (If Applicable).  If you need a refill on your cardiac medications before your next appointment, please call your pharmacy.  

## 2019-06-17 ENCOUNTER — Inpatient Hospital Stay (HOSPITAL_COMMUNITY)
Admission: EM | Admit: 2019-06-17 | Discharge: 2019-06-20 | DRG: 304 | Disposition: A | Discharge status: Home Health | Payer: Medicare HMO | Attending: Hospitalist | Admitting: Hospitalist

## 2019-06-17 ENCOUNTER — Inpatient Hospital Stay (HOSPITAL_COMMUNITY): Payer: Medicare HMO

## 2019-06-17 ENCOUNTER — Emergency Department (HOSPITAL_COMMUNITY): Payer: Medicare HMO

## 2019-06-17 ENCOUNTER — Encounter (HOSPITAL_COMMUNITY): Payer: Self-pay | Admitting: Emergency Medicine

## 2019-06-17 ENCOUNTER — Other Ambulatory Visit: Payer: Self-pay

## 2019-06-17 DIAGNOSIS — Z8673 Personal history of transient ischemic attack (TIA), and cerebral infarction without residual deficits: Secondary | ICD-10-CM | POA: Diagnosis not present

## 2019-06-17 DIAGNOSIS — Z20828 Contact with and (suspected) exposure to other viral communicable diseases: Secondary | ICD-10-CM | POA: Diagnosis present

## 2019-06-17 DIAGNOSIS — Z955 Presence of coronary angioplasty implant and graft: Secondary | ICD-10-CM | POA: Diagnosis not present

## 2019-06-17 DIAGNOSIS — I480 Paroxysmal atrial fibrillation: Secondary | ICD-10-CM | POA: Diagnosis present

## 2019-06-17 DIAGNOSIS — I161 Hypertensive emergency: Principal | ICD-10-CM | POA: Diagnosis present

## 2019-06-17 DIAGNOSIS — K219 Gastro-esophageal reflux disease without esophagitis: Secondary | ICD-10-CM | POA: Diagnosis present

## 2019-06-17 DIAGNOSIS — Z90710 Acquired absence of both cervix and uterus: Secondary | ICD-10-CM | POA: Diagnosis not present

## 2019-06-17 DIAGNOSIS — Z9114 Patient's other noncompliance with medication regimen: Secondary | ICD-10-CM

## 2019-06-17 DIAGNOSIS — I2510 Atherosclerotic heart disease of native coronary artery without angina pectoris: Secondary | ICD-10-CM | POA: Diagnosis present

## 2019-06-17 DIAGNOSIS — N186 End stage renal disease: Secondary | ICD-10-CM | POA: Diagnosis present

## 2019-06-17 DIAGNOSIS — I160 Hypertensive urgency: Secondary | ICD-10-CM

## 2019-06-17 DIAGNOSIS — Z85038 Personal history of other malignant neoplasm of large intestine: Secondary | ICD-10-CM

## 2019-06-17 DIAGNOSIS — I252 Old myocardial infarction: Secondary | ICD-10-CM | POA: Diagnosis not present

## 2019-06-17 DIAGNOSIS — Z7982 Long term (current) use of aspirin: Secondary | ICD-10-CM | POA: Diagnosis not present

## 2019-06-17 DIAGNOSIS — D638 Anemia in other chronic diseases classified elsewhere: Secondary | ICD-10-CM | POA: Diagnosis present

## 2019-06-17 DIAGNOSIS — R1310 Dysphagia, unspecified: Secondary | ICD-10-CM | POA: Diagnosis not present

## 2019-06-17 DIAGNOSIS — I248 Other forms of acute ischemic heart disease: Secondary | ICD-10-CM | POA: Diagnosis present

## 2019-06-17 DIAGNOSIS — Z951 Presence of aortocoronary bypass graft: Secondary | ICD-10-CM | POA: Diagnosis not present

## 2019-06-17 DIAGNOSIS — Z8543 Personal history of malignant neoplasm of ovary: Secondary | ICD-10-CM | POA: Diagnosis not present

## 2019-06-17 DIAGNOSIS — Z79899 Other long term (current) drug therapy: Secondary | ICD-10-CM

## 2019-06-17 DIAGNOSIS — Z833 Family history of diabetes mellitus: Secondary | ICD-10-CM | POA: Diagnosis not present

## 2019-06-17 DIAGNOSIS — I5032 Chronic diastolic (congestive) heart failure: Secondary | ICD-10-CM | POA: Diagnosis present

## 2019-06-17 DIAGNOSIS — D500 Iron deficiency anemia secondary to blood loss (chronic): Secondary | ICD-10-CM | POA: Diagnosis present

## 2019-06-17 DIAGNOSIS — E7800 Pure hypercholesterolemia, unspecified: Secondary | ICD-10-CM | POA: Diagnosis present

## 2019-06-17 DIAGNOSIS — R079 Chest pain, unspecified: Secondary | ICD-10-CM

## 2019-06-17 DIAGNOSIS — R0600 Dyspnea, unspecified: Secondary | ICD-10-CM

## 2019-06-17 DIAGNOSIS — I132 Hypertensive heart and chronic kidney disease with heart failure and with stage 5 chronic kidney disease, or end stage renal disease: Secondary | ICD-10-CM | POA: Diagnosis present

## 2019-06-17 DIAGNOSIS — Z992 Dependence on renal dialysis: Secondary | ICD-10-CM | POA: Diagnosis not present

## 2019-06-17 DIAGNOSIS — N2581 Secondary hyperparathyroidism of renal origin: Secondary | ICD-10-CM | POA: Diagnosis present

## 2019-06-17 LAB — CBC
HCT: 33.7 % — ABNORMAL LOW (ref 36.0–46.0)
Hemoglobin: 10.3 g/dL — ABNORMAL LOW (ref 12.0–15.0)
MCH: 34.8 pg — ABNORMAL HIGH (ref 26.0–34.0)
MCHC: 30.6 g/dL (ref 30.0–36.0)
MCV: 113.9 fL — ABNORMAL HIGH (ref 80.0–100.0)
Platelets: 232 10*3/uL (ref 150–400)
RBC: 2.96 MIL/uL — ABNORMAL LOW (ref 3.87–5.11)
RDW: 17.5 % — ABNORMAL HIGH (ref 11.5–15.5)
WBC: 6.2 10*3/uL (ref 4.0–10.5)
nRBC: 0 % (ref 0.0–0.2)

## 2019-06-17 LAB — TROPONIN I (HIGH SENSITIVITY)
Troponin I (High Sensitivity): 40 ng/L — ABNORMAL HIGH (ref <18)
Troponin I (High Sensitivity): 58 ng/L — ABNORMAL HIGH (ref <18)
Troponin I (High Sensitivity): 192 ng/L (ref <18)

## 2019-06-17 LAB — BASIC METABOLIC PANEL
Anion gap: 18 — ABNORMAL HIGH (ref 5–15)
BUN: 56 mg/dL — ABNORMAL HIGH (ref 8–23)
CO2: 27 mmol/L (ref 22–32)
Calcium: 10 mg/dL (ref 8.9–10.3)
Chloride: 94 mmol/L — ABNORMAL LOW (ref 98–111)
Creatinine, Ser: 10.49 mg/dL — ABNORMAL HIGH (ref 0.44–1.00)
GFR calc Af Amer: 4 mL/min — ABNORMAL LOW (ref >60)
GFR calc non Af Amer: 3 mL/min — ABNORMAL LOW (ref >60)
Glucose, Bld: 84 mg/dL (ref 70–99)
Potassium: 4.2 mmol/L (ref 3.5–5.1)
Sodium: 139 mmol/L (ref 135–145)

## 2019-06-17 LAB — RESPIRATORY PANEL BY RT PCR (FLU A&B, COVID)
Influenza A by PCR: NEGATIVE
Influenza B by PCR: NEGATIVE
SARS Coronavirus 2 by RT PCR: NEGATIVE

## 2019-06-17 LAB — MRSA PCR SCREENING: MRSA by PCR: NEGATIVE

## 2019-06-17 IMAGING — DX DG CHEST 2V
2 series · 2 of 2 positions shown · non-contrast
Comparison: [DATE]

CLINICAL DATA: Chest pain beginning today.

EXAM:
CHEST - 2 VIEW

[chest pa]
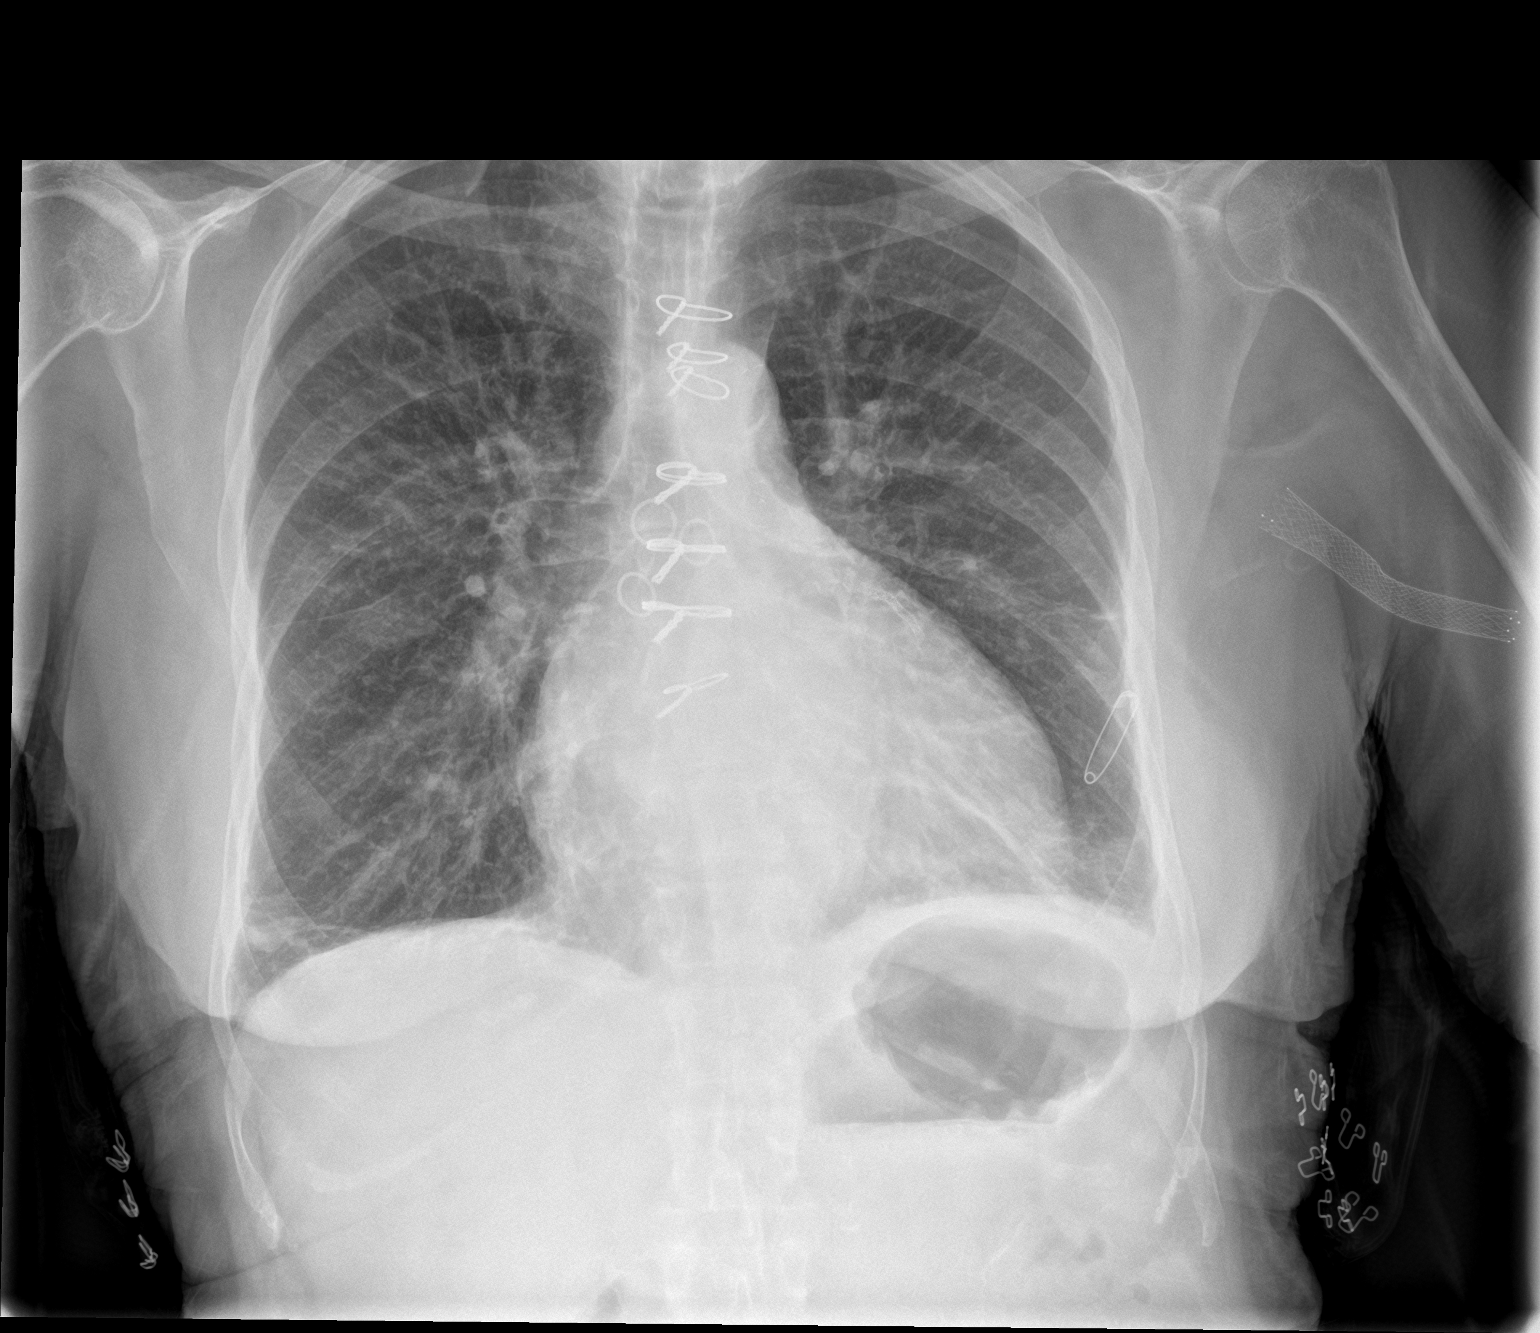

[chest lat]
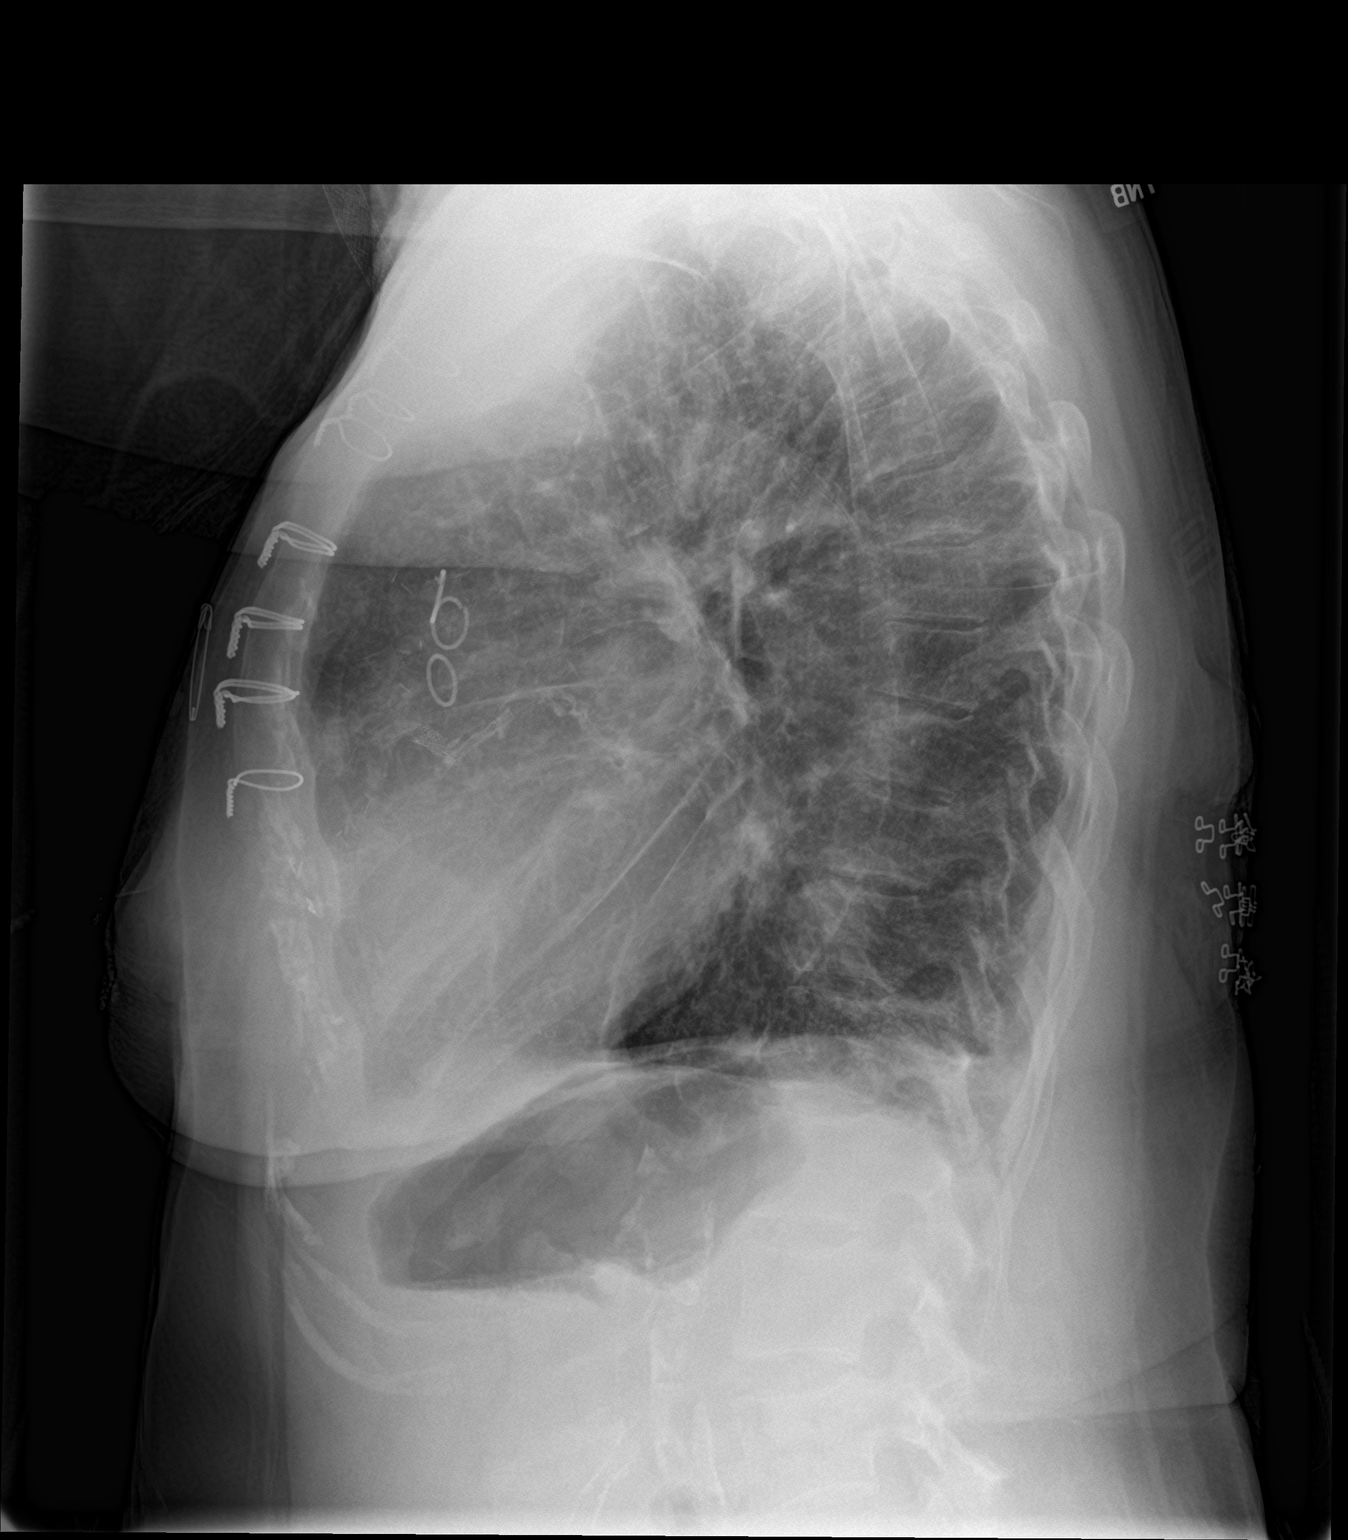

[2 of 2 positions shown; findings below may reference images not displayed]

FINDINGS: Previous median sternotomy and CABG. Chronic cardiomegaly and aortic
atherosclerosis. Mild chronic scarring at both lung bases. Possible
venous hypertension but no frank edema. No consolidation, collapse
or measurable effusion. No acute bone finding.
IMPRESSION: Previous CABG. Chronic cardiomegaly. Possible venous hypertension.
No frank edema.

## 2019-06-17 IMAGING — DX DG CHEST 1V PORT
1 series · 1 of 1 positions shown · non-contrast
Comparison: Film from earlier in the same day.

CLINICAL DATA: Shortness of breath and weakness

EXAM:
PORTABLE CHEST 1 VIEW

[chest ap]
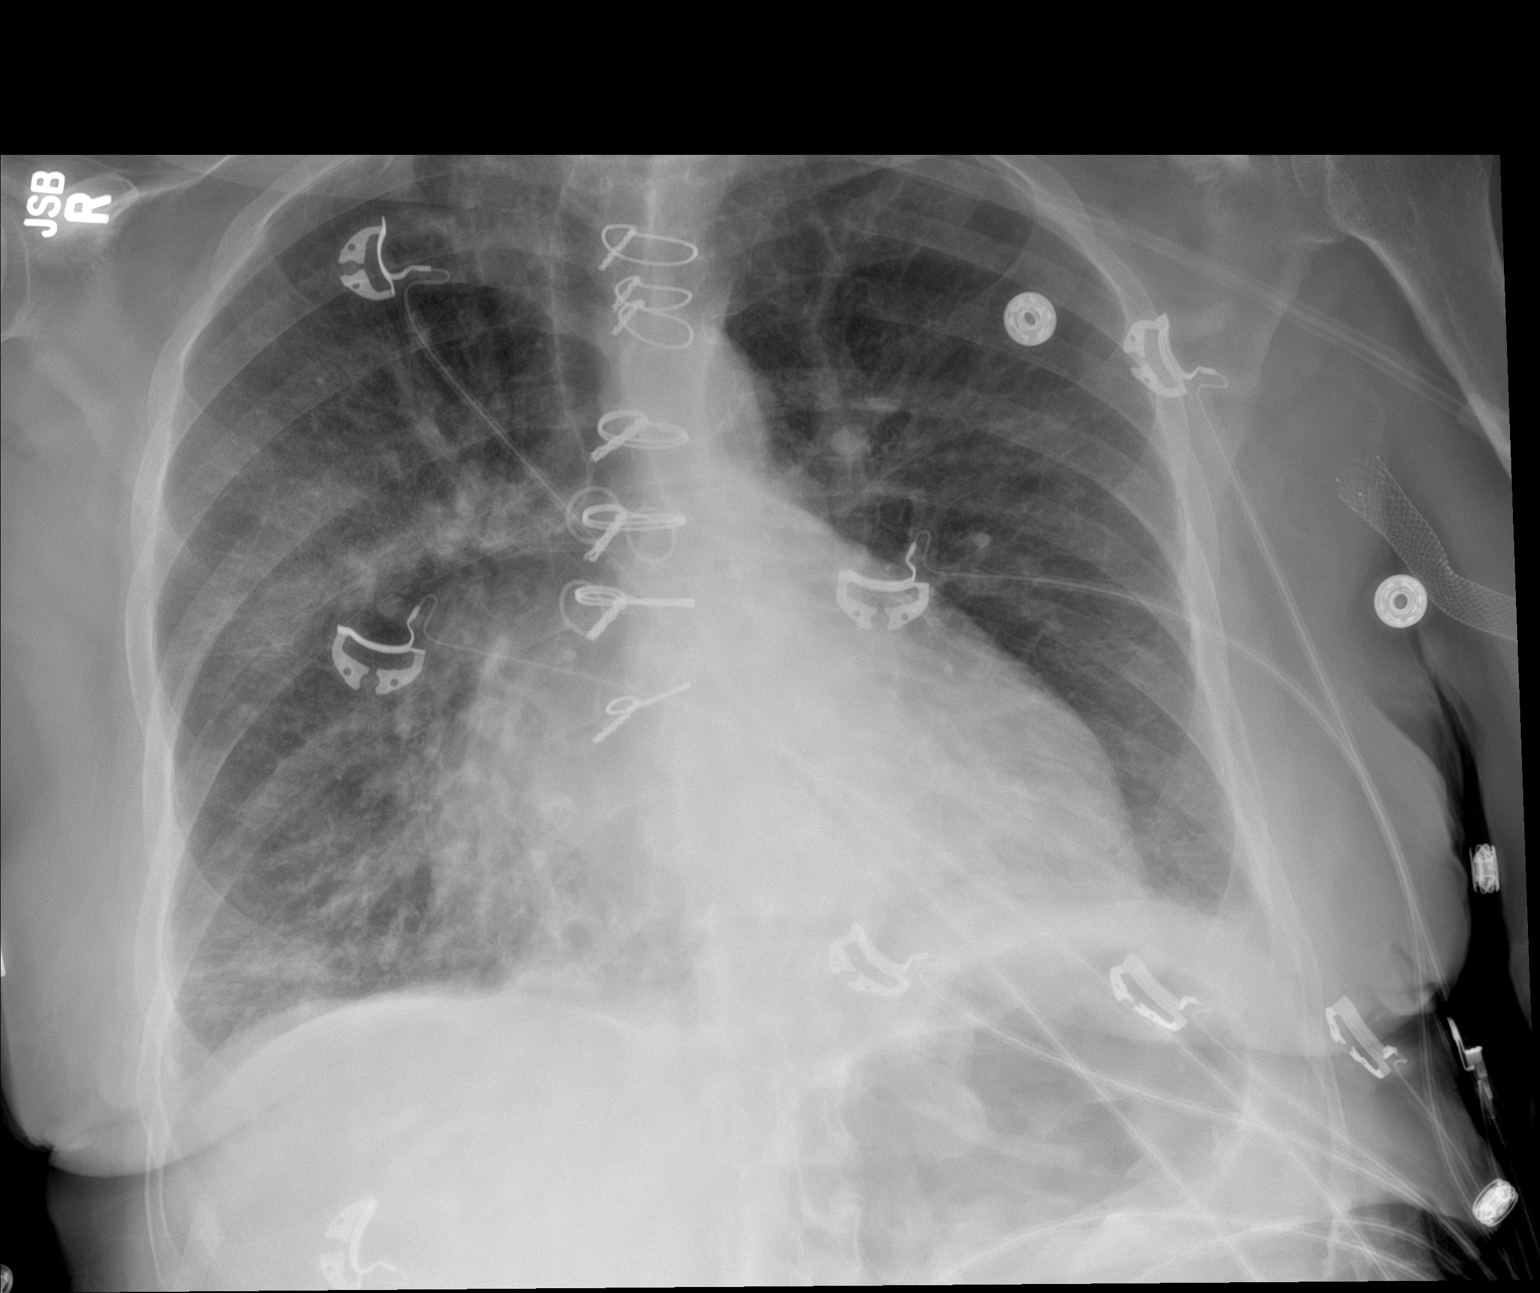

[1 of 1 positions shown; findings below may reference images not displayed]

FINDINGS: Cardiac shadow is stable. Post surgical changes are again seen.
Increasing patchy infiltrate is noted within the mid right lung when
compare with the prior exam likely related to developing edema. No
bony abnormality is noted.
IMPRESSION: 1. Increasing vascular congestion with changes suggestive of
asymmetric edema on the right.

## 2019-06-17 MED ORDER — ONDANSETRON HCL 4 MG PO TABS: 4.0000 mg | ORAL_TABLET | Freq: Four times a day (QID) | ORAL | Status: DC | PRN

## 2019-06-17 MED ORDER — CARVEDILOL 12.5 MG PO TABS
12.5000 mg | ORAL_TABLET | Freq: Two times a day (BID) | ORAL | Status: DC
  Administered 2019-06-17 – 2019-06-18 (×2): 12.5 mg via ORAL
  Filled 2019-06-17 (×4): qty 1

## 2019-06-17 MED ORDER — CLONIDINE HCL 0.1 MG PO TABS
0.1000 mg | ORAL_TABLET | Freq: Two times a day (BID) | ORAL | Status: DC
  Administered 2019-06-17 – 2019-06-19 (×4): 0.1 mg via ORAL
  Filled 2019-06-17 (×4): qty 1

## 2019-06-17 MED ORDER — RENA-VITE PO TABS
1.0000 | ORAL_TABLET | Freq: Every day | ORAL | Status: DC
  Administered 2019-06-17 – 2019-06-20 (×4): 1 via ORAL
  Filled 2019-06-17 (×4): qty 1

## 2019-06-17 MED ORDER — POLYETHYLENE GLYCOL 3350 17 G PO PACK: 17.0000 g | PACK | Freq: Every day | ORAL | Status: DC | PRN

## 2019-06-17 MED ORDER — AMIODARONE HCL 200 MG PO TABS
200.0000 mg | ORAL_TABLET | Freq: Every day | ORAL | Status: DC
  Administered 2019-06-18 – 2019-06-20 (×3): 200 mg via ORAL
  Filled 2019-06-17 (×3): qty 1

## 2019-06-17 MED ORDER — NITROGLYCERIN IN D5W 200-5 MCG/ML-% IV SOLN
5.0000 ug/min | INTRAVENOUS | Status: DC
  Filled 2019-06-17 (×2): qty 250

## 2019-06-17 MED ORDER — SEVELAMER CARBONATE 800 MG PO TABS: 1600.0000 mg | ORAL_TABLET | Freq: Two times a day (BID) | ORAL | Status: DC | PRN

## 2019-06-17 MED ORDER — PANTOPRAZOLE SODIUM 40 MG PO TBEC
40.0000 mg | DELAYED_RELEASE_TABLET | Freq: Every day | ORAL | Status: DC
  Filled 2019-06-17 (×2): qty 1

## 2019-06-17 MED ORDER — ASPIRIN EC 81 MG PO TBEC
81.0000 mg | DELAYED_RELEASE_TABLET | Freq: Every day | ORAL | Status: DC
  Administered 2019-06-18 – 2019-06-20 (×3): 81 mg via ORAL
  Filled 2019-06-17 (×3): qty 1

## 2019-06-17 MED ORDER — FELODIPINE ER 2.5 MG PO TB24
2.5000 mg | ORAL_TABLET | Freq: Every day | ORAL | Status: DC
  Administered 2019-06-17 – 2019-06-19 (×3): 2.5 mg via ORAL
  Filled 2019-06-17 (×5): qty 1

## 2019-06-17 MED ORDER — GABAPENTIN 100 MG PO CAPS
100.0000 mg | ORAL_CAPSULE | Freq: Every day | ORAL | Status: DC
  Administered 2019-06-17 – 2019-06-19 (×3): 100 mg via ORAL
  Filled 2019-06-17 (×3): qty 1

## 2019-06-17 MED ORDER — ROSUVASTATIN CALCIUM 10 MG PO TABS
10.0000 mg | ORAL_TABLET | Freq: Every evening | ORAL | Status: DC
  Administered 2019-06-18 – 2019-06-20 (×3): 10 mg via ORAL
  Filled 2019-06-17 (×3): qty 1

## 2019-06-17 MED ORDER — SEVELAMER CARBONATE 800 MG PO TABS
2400.0000 mg | ORAL_TABLET | Freq: Three times a day (TID) | ORAL | Status: DC
  Administered 2019-06-18 – 2019-06-20 (×9): 2400 mg via ORAL
  Filled 2019-06-17 (×9): qty 3

## 2019-06-17 MED ORDER — ENOXAPARIN SODIUM 40 MG/0.4ML ~~LOC~~ SOLN: 40.0000 mg | SUBCUTANEOUS | Status: DC

## 2019-06-17 MED ORDER — HEPARIN SODIUM (PORCINE) 5000 UNIT/ML IJ SOLN
5000.0000 [IU] | Freq: Three times a day (TID) | INTRAMUSCULAR | Status: DC
  Administered 2019-06-17 – 2019-06-20 (×7): 5000 [IU] via SUBCUTANEOUS
  Filled 2019-06-17 (×7): qty 1

## 2019-06-17 MED ORDER — ALBUTEROL SULFATE (2.5 MG/3ML) 0.083% IN NEBU: 2.5000 mg | INHALATION_SOLUTION | RESPIRATORY_TRACT | Status: DC | PRN

## 2019-06-17 MED ORDER — ONDANSETRON HCL 4 MG/2ML IJ SOLN: 4.0000 mg | Freq: Four times a day (QID) | INTRAMUSCULAR | Status: DC | PRN

## 2019-06-17 NOTE — ED Notes (Signed)
Pt stating she is SOB. Have gotten Dr. Lacinda Axon to bedside. Applied 4L . Going to d repeat EKG

## 2019-06-17 NOTE — Progress Notes (Signed)
CRITICAL VALUE ALERT  Critical Value:  Troponin 158  Date & Time Notied:  06/17/19  @ 2145   Provider Notified: Dr. Scherrie November  Orders Received/Actions taken: Waiting for call back/orders

## 2019-06-17 NOTE — H&P (Addendum)
History and Physical    Shawna [name] FAO:[id number] DOB: 28-Apr-1941 DOA: 06/17/2019  PCP: [name], MD   Patient coming from: Home  I have personally briefly reviewed patient's old medical records in [place]  Chief Complaint: Chest pain  HPI: Shawna [name] is a 78 y.o. female with medical history significant for ESRD, CABG 6962, diastolic CHF, hypertension, cardiac arrest, atrial fibrillation, GI bleed- small bowel AVMs.  Patient presented to the ED with complaints of 2 episodes of chest pain that started early this morning when she was about to go to bed sleep- at about 2 AM and then again at about 10 AM when she was about to eat.  Chest pain improved with nitro both times.  She reports intermittent chest pain lasting a 50 minutes over the past week.  She describes pain as pressure-like and "hurting".  Chest pain is nonradiating.  She reports compliance with her medications HD sessions. Patient was supposed to get dialyzed today, her schedule is Monday Wednesday Friday.  Last HD was on Friday.  Recent hospitalization 11/23-11/25-chest pain/angina.  Medications were adjusted including discontinuing Brilinta, due to chronic blood loss anemia and adding Plendil.  ED Course: Blood pressure systolic went up to 952/84, tachycardic to 120, O2 sats 100% on room air. Hs troponin 40 > 58.  Two-view chest x-ray possible venous hypertension.  No frank edema.  Initial EKG sinus tachycardia, without significant change.  With onset of dyspnea in the ED, repeat EKG done showed T wave changes in lateral leads I, aVL and V5 through V6.  Patient was started on nitro drip.  Hospitalist to admit for further evaluation and management.  Review of Systems: As per HPI all other systems reviewed and negative.  Past Medical History:  Diagnosis Date  . Acute on chronic respiratory failure with hypoxia ([place]) 10/10/2016  . Anxiety   . Arthritis   . AVM (arteriovenous malformation) of colon   . CAD  (coronary artery disease)    a. s/p CABG in 2013 b. most recent DES to D1 in 10/2016 with cath showing patent LIMA-LAD, SVG-RI, SVG-Mrg and SVG-RCA  c. NST in 02/2018 showing scar with no significant ischemia  . Carotid artery disease ([place])    a. 13-24% LICA, 10/100   . Chronic bronchitis ([place])   . Chronic diastolic CHF (congestive heart failure) ([place])    a. 02/2012 Echo EF 60-65%, nl wall motion, Gr 1 DD, mod MR  . Colon cancer ([place]) 1992  . Esophageal stricture   . ESRD on hemodialysis ([place])    ESRD due to HTN, started dialysis 2011 and gets HD at [facility] with Dr Hinda Lenis on MWF schedule.  Access is LUA AVF as of Sept 2014.   Marland Kitchen GERD (gastroesophageal reflux disease)   . High cholesterol 12/2011  . History of blood transfusion 07/2011; 12/2011; 01/2012 X 2; 04/2012  . History of gout   . History of lower GI bleeding   . Hypertension   . Iron deficiency anemia   . Mitral regurgitation    a. Moderate by echo, 02/2012  . Myocardial infarction ([place])   . Ovarian cancer ([place]) 1992  . Pneumonia ~ 2009  . PUD (peptic ulcer disease)   . TIA (transient ischemic attack)     Past Surgical History:  Procedure Laterality Date  . A/V SHUNTOGRAM Left 03/19/2019   Procedure: A/V SHUNTOGRAM;  Surgeon: Katha Cabal, MD;  Location: [place] [facility];  Service: Cardiovascular;  Laterality: Left;  .  ABDOMINAL HYSTERECTOMY  1992  . APPENDECTOMY  06/1990  . AV FISTULA PLACEMENT  07/2009   left upper arm  . AV FISTULA PLACEMENT Right 09/06/2016   Procedure: RIGHT FOREARM ARTERIOVENOUS (AV) GRAFT;  Surgeon: Elam Dutch, MD;  Location: [facility] OR;  Service: Vascular;  Laterality: Right;  . AV FISTULA PLACEMENT N/A 02/24/2017   Procedure: INSERTION OF ARTERIOVENOUS (AV) GORE-TEX GRAFT ARM (BRACHIAL AXILLARY);  Surgeon: Katha Cabal, MD;  Location: [facility] ORS;  Service: Vascular;  Laterality: N/A;  . [place] Right 09/06/2016   Procedure: REMOVAL OF Right Arm ARTERIOVENOUS GORETEX GRAFT and Vein  Patch angioplasty of brachial artery;  Surgeon: Angelia Mould, MD;  Location: [place];  Service: Vascular;  Laterality: Right;  . COLON RESECTION  1992  . COLON SURGERY    . COLONOSCOPY N/A 03/09/2019   Procedure: COLONOSCOPY;  Surgeon: Rogene Houston, MD;  [facility];  Service: Endoscopy;  Laterality: N/A;  . CORONARY ANGIOPLASTY WITH STENT PLACEMENT  12/15/11   "2"  . CORONARY ANGIOPLASTY WITH STENT PLACEMENT  y/2013   "1; makes total of 3" (05/02/2012)  . CORONARY ARTERY BYPASS GRAFT  06/13/2012   Procedure: CORONARY ARTERY BYPASS GRAFTING (CABG);  Surgeon: Grace Isaac, MD;  Location: [place];  Service: Open Heart Surgery;  Laterality: N/A;  cabg x four;  using left internal mammary artery, and left leg greater saphenous vein harvested endoscopically  . CORONARY STENT INTERVENTION N/A 10/13/2016   Procedure: Coronary Stent Intervention;  Surgeon: Troy Sine, MD;  Location: [name] [facility];  Service: Cardiovascular;  Laterality: N/A;  . DIALYSIS/PERMA CATHETER REMOVAL N/A 04/18/2017   Procedure: DIALYSIS/PERMA CATHETER REMOVAL;  Surgeon: Katha Cabal, MD;  Location: [place] [facility];  Service: Cardiovascular;  Laterality: N/A;  . DILATION AND CURETTAGE OF UTERUS    . ESOPHAGOGASTRODUODENOSCOPY  01/20/2012   Procedure: ESOPHAGOGASTRODUODENOSCOPY (EGD);  Surgeon: Ladene Artist, MD,FACG;  Location: [facility] ENDOSCOPY;  Service: Endoscopy;  Laterality: N/A;  . ESOPHAGOGASTRODUODENOSCOPY N/A 03/26/2013   Procedure: ESOPHAGOGASTRODUODENOSCOPY (EGD);  Surgeon: Irene Shipper, MD;  Location[facility];  Service: Endoscopy;  Laterality: N/A;  . ESOPHAGOGASTRODUODENOSCOPY N/A 04/30/2015   Procedure: ESOPHAGOGASTRODUODENOSCOPY (EGD);  Surgeon: Rogene Houston, MD;  [facility];  Service: Endoscopy;  Laterality: N/A;  1pm - moved to 10/20 @ 1:10  . ESOPHAGOGASTRODUODENOSCOPY N/A 07/29/2016   Procedure: ESOPHAGOGASTRODUODENOSCOPY (EGD);  Surgeon: Manus Gunning, MD;  Location: [place];  Service: Gastroenterology;  Laterality: N/A;  enteroscopy  . GIVENS CAPSULE STUDY N/A 03/07/2019   Procedure: GIVENS CAPSULE STUDY;  Surgeon: Rogene Houston, MD;  [facility];  Service: Endoscopy;  Laterality: N/A;  7:30  . INTRAOPERATIVE TRANSESOPHAGEAL ECHOCARDIOGRAM  06/13/2012   Procedure: INTRAOPERATIVE TRANSESOPHAGEAL ECHOCARDIOGRAM;  Surgeon: Grace Isaac, MD;  Location: [place];  Service: Open Heart Surgery;  Laterality: N/A;  . IR GENERIC HISTORICAL  07/26/2016   IR FLUORO GUIDE CV LINE RIGHT 07/26/2016 Greggory Keen, MD [facility]  . IR GENERIC HISTORICAL  07/26/2016   IR US GUIDE VASC ACCESS RIGHT 07/26/2016 Greggory Keen, MD [facility]  . IR GENERIC HISTORICAL  08/02/2016   IR US GUIDE VASC ACCESS RIGHT 08/02/2016 Greggory Keen, MD [facility]  . IR GENERIC HISTORICAL  08/02/2016   IR FLUORO GUIDE CV LINE RIGHT 08/02/2016 Greggory Keen, MD [facility]  . IR RADIOLOGY PERIPHERAL GUIDED IV START  03/28/2017  . IR US GUIDE VASC ACCESS RIGHT  03/28/2017  .  LEFT HEART CATH AND CORONARY ANGIOGRAPHY N/A 09/20/2016   Procedure: Left Heart Cath and Coronary Angiography;  Surgeon: [name], MD;  Location: [place] [facility];  Service: Cardiovascular;  Laterality: N/A;  . LEFT HEART CATH AND CORS/GRAFTS ANGIOGRAPHY N/A 10/13/2016   Procedure: Left Heart Cath and Cors/Grafts Angiography;  Surgeon: Troy Sine, MD;  Location: [place] [facility];  Service: Cardiovascular;  Laterality: N/A;  . LEFT HEART CATH AND CORS/GRAFTS ANGIOGRAPHY N/A 07/13/2018   Procedure: LEFT HEART CATH AND CORS/GRAFTS ANGIOGRAPHY;  Surgeon: Martinique, Peter M, MD;  Location: [place] [facility];  Service: Cardiovascular;  Laterality: N/A;  . LEFT HEART CATHETERIZATION WITH CORONARY ANGIOGRAM N/A 12/15/2011   Procedure: LEFT HEART CATHETERIZATION WITH CORONARY ANGIOGRAM;  Surgeon: Burnell Blanks, MD;  Location: [facility] CATH LAB;  Service: Cardiovascular;   Laterality: N/A;  . LEFT HEART CATHETERIZATION WITH CORONARY ANGIOGRAM N/A 01/10/2012   Procedure: LEFT HEART CATHETERIZATION WITH CORONARY ANGIOGRAM;  Surgeon: Peter M Martinique, MD;  Location: [facility] CATH LAB;  Service: Cardiovascular;  Laterality: N/A;  . LEFT HEART CATHETERIZATION WITH CORONARY ANGIOGRAM N/A 06/08/2012   Procedure: LEFT HEART CATHETERIZATION WITH CORONARY ANGIOGRAM;  Surgeon: Burnell Blanks, MD;  Location: [facility] CATH LAB;  Service: Cardiovascular;  Laterality: N/A;  . LEFT HEART CATHETERIZATION WITH CORONARY/GRAFT ANGIOGRAM N/A 12/10/2013   Procedure: LEFT HEART CATHETERIZATION WITH Beatrix Fetters;  Surgeon: [name], MD;  Location: [facility] CATH LAB;  Service: Cardiovascular;  Laterality: N/A;  . OVARY SURGERY     ovarian cancer  . POLYPECTOMY  03/09/2019   Procedure: POLYPECTOMY;  Surgeon: Rogene Houston, MD;  [facility];  Service: Endoscopy;;  cecal   . REVISION OF ARTERIOVENOUS GORETEX GRAFT N/A 02/24/2017   Procedure: REVISION OF ARTERIOVENOUS GORETEX GRAFT (RESECTION);  Surgeon: Katha Cabal, MD;  Location: [facility] ORS;  Service: Vascular;  Laterality: N/A;  Earney Mallet N/A 10/15/2013   Procedure: Fistulogram;  Surgeon: Serafina Mitchell, MD;  Location: [facility] CATH LAB;  Service: Cardiovascular;  Laterality: N/A;  . THROMBECTOMY / ARTERIOVENOUS GRAFT REVISION  2011   left upper arm  . TUBAL LIGATION  1980's  . UPPER EXTREMITY ANGIOGRAPHY Bilateral 12/06/2016   Procedure: Upper Extremity Angiography;  Surgeon: Katha Cabal, MD;  Location: [place] [facility];  Service: Cardiovascular;  Laterality: Bilateral;  . UPPER EXTREMITY INTERVENTION Left 06/06/2017   Procedure: UPPER EXTREMITY INTERVENTION;  Surgeon: Katha Cabal, MD;  Location: [place] [facility];  Service: Cardiovascular;  Laterality: Left;     reports that she has never smoked. She has never used smokeless tobacco. She reports that she does not drink alcohol or use drugs.   Allergies  Allergen Reactions  . Aspirin Other (See Comments)    High Doses Mess up her stomach; "makes my bowels have blood in them". Takes 81 mg EC Aspirin   . Penicillins Other (See Comments)    SYNCOPE? , "makes me real weak when I take it; like I'll pass out"  Has patient had a PCN reaction causing immediate rash, facial/tongue/throat swelling, SOB or lightheadedness with hypotension: Yes Has patient had a PCN reaction causing severe rash involving mucus membranes or skin necrosis: no Has patient had a PCN reaction that required hospitalization no Has patient had a PCN reaction occurring within the last 10 years: no If all of the above  . Amlodipine Swelling  . Bactrim [Sulfamethoxazole-Trimethoprim] Rash  . Contrast Media [Iodinated Diagnostic Agents] Itching  . Iron Itching and  Other (See Comments)    "they gave me iron in dialysis; had to give me Benadryl cause I had to have the iron" (05/02/2012)  . Nitrofurantoin Hives  . Tylenol [Acetaminophen] Itching and Other (See Comments)    Makes her feet on fire per pt  . Gabapentin Other (See Comments)    Unknown reaction  . Hydralazine Itching  . Dexilant [Dexlansoprazole] Other (See Comments)    Upset stomach  . Levaquin [Levofloxacin In D5w] Rash  . Morphine And Related Itching and Other (See Comments)    Itching in feet  . Plavix [Clopidogrel Bisulfate] Rash  . Protonix [Pantoprazole Sodium] Rash  . Venofer [Ferric Oxide] Itching and Other (See Comments)    Patient reports using Benadryl prior to doses as [facility]    Family History  Problem Relation Age of Onset  . Heart disease Mother        Heart Disease before age 14  . Hyperlipidemia Mother   . Hypertension Mother   . Diabetes Mother   . Heart attack Mother   . Heart disease Father        Heart Disease before age 52  . Hyperlipidemia Father   . Hypertension Father   . Diabetes Father   . Diabetes Sister   . Hypertension Sister   . Diabetes Brother    . Hyperlipidemia Brother   . Heart attack Brother   . Hypertension Sister   . Heart attack Brother   . Colon cancer Child 9  . Other Other        noncontributory for early CAD  . Esophageal cancer Neg Hx   . Liver disease Neg Hx   . Kidney disease Neg Hx   . Colon polyps Neg Hx     Prior to Admission medications   Medication Sig Start Date End Date Taking? Authorizing Provider  nitroGLYCERIN (NITROSTAT) 0.4 MG SL tablet DISSOLVE ONE TABLET UNDER THE TONGUE EVERY 5 MINUTES AS NEEDED FOR CHEST PAIN.  DO NOT EXCEED A TOTAL OF 3 DOSES IN 15 MINUTES Patient taking differently: Place 0.4 mg under the tongue every 5 (five) minutes as needed for chest pain.  10/08/18  Yes Herminio Commons, MD  ALPRAZolam Duanne Moron) 0.5 MG tablet Take 0.5 mg by mouth daily. 10/16/18   [provider]  amiodarone (PACERONE) 200 MG tablet Take 1 tablet (200 mg total) by mouth daily. 02/19/19   Herminio Commons, MD  aspirin EC 81 MG tablet Take 1 tablet (81 mg total) by mouth daily. Restart on 03/13/19 03/13/19   Orson Eva, MD  benzonatate (TESSALON) 100 MG capsule TAKE 1 CAPSULE BY MOUTH THREE TIMES DAILY AS NEEDED FOR COUGH 03/19/19   [provider]  carvedilol (COREG) 12.5 MG tablet Take 1 tablet (12.5 mg total) by mouth 2 (two) times daily with a meal. Additional refills per PCP Patient taking differently: Take 12.5 mg by mouth every morning. Additional refills per PCP 03/13/19 06/13/19  Herminio Commons, MD  cloNIDine (CATAPRES) 0.1 MG tablet Take 1 tablet (0.1 mg total) by mouth 2 (two) times daily. Patient taking differently: Take 0.1 mg by mouth 2 (two) times daily as needed.  02/01/19   Daune Perch, NP  diphenhydrAMINE (BENADRYL) 25 mg capsule Take 50 mg by mouth daily as needed for itching or allergies.     [provider]  felodipine (PLENDIL) 2.5 MG 24 hr tablet Take 1 tablet (2.5 mg total) by mouth daily. 06/06/19   Orson Eva, MD  fluticasone (FLONASE) 50 MCG/ACT nasal spray  Place 1 spray at bedtime as needed into both nostrils for allergies.     [provider]  gabapentin (NEURONTIN) 100 MG capsule Take 100 mg by mouth at bedtime. 06/10/19   [provider]  hyoscyamine (LEVSIN SL) 0.125 MG SL tablet Place 1 tablet (0.125 mg total) under the tongue 3 (three) times daily. 05/16/19   Rogene Houston, MD  isosorbide mononitrate (IMDUR) 60 MG 24 hr tablet Take 1 tablet (60 mg total) by mouth 2 (two) times daily. 06/05/19   Orson Eva, MD  lidocaine-prilocaine (EMLA) cream Apply 1 application topically every Monday, Wednesday, and Friday. Prior to dialysis    [provider]  loratadine (CLARITIN) 10 MG tablet Take 1 tablet (10 mg total) by mouth daily as needed for allergies. 07/14/18   Hosie Poisson, MD  multivitamin (RENA-VIT) TABS tablet Take 1 tablet by mouth daily.    [provider]  nystatin ointment (MYCOSTATIN) APPLY A SMALL AMOUNT TOPICALLY TWICE DAILY FOR 7 DAYS TO EXTERNAL ANAL AREA FOR ANAL ITCHINESS 03/21/19   [provider]  omeprazole (PRILOSEC) 20 MG capsule Take 2 capsules (40 mg total) by mouth 2 (two) times daily before a meal. 06/05/19   Tat, Shanon Brow, MD  ondansetron (ZOFRAN-ODT) 4 MG disintegrating tablet DISSOLVE 1 TABLET IN MOUTH TWICE DAILY AS NEEDED 03/05/19   [provider]  rosuvastatin (CRESTOR) 10 MG tablet Take 1 tablet (10 mg total) by mouth daily. 06/05/19   Orson Eva, MD  sevelamer carbonate (RENVELA) 800 MG tablet TAKE 3 TABLETS BY MOUTH THREE TIMES DAILY WITH MEALS AND 2 TABLETS WITH SNACKS 04/03/19   [provider]    Physical Exam: Vitals:   06/17/19 1615 06/17/19 1628 06/17/19 1630 06/17/19 1715  BP: (!) 211/88  (!) 209/71 (!) 190/80  Pulse: 95 98 98 88  Resp: 19 16 (!) 25 (!) 21  Temp:      TempSrc:      SpO2: 100% 100% 100% 100%  Weight:      Height:        Constitutional: Comfortable at rest, mildly dyspneic when making long story sentences Vitals:   06/17/19  1615 06/17/19 1628 06/17/19 1630 06/17/19 1715  BP: (!) 211/88  (!) 209/71 (!) 190/80  Pulse: 95 98 98 88  Resp: 19 16 (!) 25 (!) 21  Temp:      TempSrc:      SpO2: 100% 100% 100% 100%  Weight:      Height:       Eyes: PERRL, lids and conjunctivae normal ENMT: Mucous membranes are moist. Posterior pharynx clear of any exudate or lesions.Normal dentition.  Neck: normal, supple, no masses, no thyromegaly Respiratory: clear to auscultation bilaterally, no wheezing, no crackles.  Cardiovascular: Regular rate and rhythm, no murmurs / rubs / gallops. No extremity edema. 2+ pedal pulses. No carotid bruits.  Abdomen: no tenderness, no masses palpated. No hepatosplenomegaly. Bowel sounds positive.  Musculoskeletal: no clubbing / cyanosis. No joint deformity upper and lower extremities. Good ROM, no contractures. Normal muscle tone.  Skin: no rashes, lesions, ulcers. No induration Neurologic: CN 2-12 grossly intact. Sensation intact, DTR normal. Strength 5/5 in all 4.  Psychiatric: Normal judgment and insight. Alert and oriented x 3. Normal mood.   Labs on Admission: I have personally reviewed following labs and imaging studies  CBC: Recent Labs  Lab 06/17/19 1429  WBC 6.2  HGB 10.3*  HCT 33.7*  MCV 113.9*  PLT  932   Basic Metabolic Panel: Recent Labs  Lab 06/17/19 1429  NA 139  K 4.2  CL 94*  CO2 27  GLUCOSE 84  BUN 56*  CREATININE 10.49*  CALCIUM 10.0    Radiological Exams on Admission: Dg Chest 2 View  Result Date: 06/17/2019 CLINICAL DATA:  Chest pain beginning today. EXAM: CHEST - 2 VIEW COMPARISON:  06/03/2019 FINDINGS: Previous median sternotomy and CABG. Chronic cardiomegaly and aortic atherosclerosis. Mild chronic scarring at both lung bases. Possible venous hypertension but no frank edema. No consolidation, collapse or measurable effusion. No acute bone finding. IMPRESSION: Previous CABG. Chronic cardiomegaly. Possible venous hypertension. No frank edema.  Electronically Signed   By: Nelson Chimes M.D.   On: 06/17/2019 13:23    EKG: Independently reviewed.  Sinus tachycardia.  Left ventricular hypertrophy.  Repeat EKG shows T wave changes in lateral leads I, aVL, V5 and V6.  Assessment/Plan Active Problems:   Hypertensive urgency   Hypertensive urgency- no peripheral signs of volume overload, or endorgan damage.  History of medication noncompliance, still taking medications PRN and adjusting on her own.  2 view chest x-ray possible venous hypertension.  Non compliance with her clonidine may be causing rebound hypertension. -Continue nitro drip started in ED -Home medications clonidine 0.1 twice daily, carvedilol 12.5 mg twice daily -Addendum-repeat portable chest x-ray shows increasing vascular congestion with changes suggestive of asymmetric edema on the right.  Likely cause of patient's sudden difficulty breathing.  Currently on room air, sats 100%.  Coronary artery disease history-history of CABG 2013, DES to D1 2018. Cardiac Cath January2020revealed severe multivessel native CAD-felt to be best managed medically.  Reported chest pain.  Chronic Troponin elevation. Hs Troponin 40 > 58.  Repeat EKG in the ED shows T wave changes in lateral leads.  Brilinta recently discontinued due to GI bleed.  Follows with cardiologist Dr. Max Sane.  Per cardiology, EKG findings reflective of hypertensive urgency, recommend blood pressure control. -Trend troponin -Continue nitro drip -Resume aspirin, statins.  ESRD-schedule Monday Wednesday Friday.  Due for HD today.  Last HD Friday.  Potassium 4.2. -Talked to Dr. Carolin Sicks with nephrology, patient will be dialyzed tomorrow.  History of paroxysmal atrial fibrillation/nonsustained ventricular tachycardia-in sinus rhythm.  Rate controlled.  Not on systemic anticoagulation due to history of GI bleed. -Resume home amiodarone, carvedilol, Plendil  Diastolic CHF-stable and compensated.  Last echo 12/2018 EF of  60 to 65%, left ventricular diastolic parameters consistent with impaired relaxation. -Volume status per  HD.  History of GI bleed-history of small bowel AVMs, chronic blood loss anemia.  Hgb stable at 10.3.  Brilinta recently discontinued. -Resume aspirin.   DVT prophylaxis: Lovenox Code Status: Full code Family Communication: None at bedside Disposition Plan: Per rounding team Consults called: None Admission status: Inpatient, stepdown I certify that at the point of admission it is my clinical judgment that the patient will require inpatient hospital care spanning beyond 2 midnights from the point of admission due to high intensity of service, high risk for further deterioration and high frequency of surveillance required. The following factors support the patient status of inpatient: Hypertensive urgency requiring intravenous nitroglycerin for blood pressure control also for chest pain.   Bethena Roys MD [facility]  06/17/2019, 6:14 PM

## 2019-06-17 NOTE — Progress Notes (Signed)
Patient alert and oriented x4. Patient's wallet which includes debit card and $11.00 and a bunch of other things in it sent down with security and locked up in safe. Patient stated she will have her husband take her purse and wallet (that is locked up) home tomorrow when he visits.

## 2019-06-17 NOTE — ED Triage Notes (Signed)
Pt states she began having chest pain around 2am. States she took a nitro and it went away and came back. Took another nitro at 10am with pain relief. Was supposed to go to dialysis today.

## 2019-06-17 NOTE — ED Provider Notes (Addendum)
[facility] EMERGENCY DEPARTMENT Provider Note   CSN: [id number] Arrival date & time: 06/17/19  1059     History   Chief Complaint Chief Complaint  Patient presents with  . Chest Pain    HPI Shawna [name] is a 78 y.o. female.     Chief complaint chest pain at approximately 0200 and 1000 today.  Patient took nitroglycerin each time with pain relief.  No dyspnea, diaphoresis, nausea.  Past medical history includes end-stage renal disease with dialysis on Monday Wednesday and Friday.  No dialysis today.  She feels better now.  Severity is mild to moderate.  Nothing makes symptoms better or worse.     Past Medical History:  Diagnosis Date  . Acute on chronic respiratory failure with hypoxia ([place]) 10/10/2016  . Anxiety   . Arthritis   . AVM (arteriovenous malformation) of colon   . CAD (coronary artery disease)    a. s/p CABG in 2013 b. most recent DES to D1 in 10/2016 with cath showing patent LIMA-LAD, SVG-RI, SVG-Mrg and SVG-RCA  c. NST in 02/2018 showing scar with no significant ischemia  . Carotid artery disease ([place])    a. 80-99% LICA, 02/3381   . Chronic bronchitis ([place])   . Chronic diastolic CHF (congestive heart failure) ([place])    a. 02/2012 Echo EF 60-65%, nl wall motion, Gr 1 DD, mod MR  . Colon cancer ([place]) 1992  . Esophageal stricture   . ESRD on hemodialysis ([place])    ESRD due to HTN, started dialysis 2011 and gets HD [facility] with Dr Hinda Lenis on MWF schedule.  Access is LUA AVF as of Sept 2014.   Marland Kitchen GERD (gastroesophageal reflux disease)   . High cholesterol 12/2011  . History of blood transfusion 07/2011; 12/2011; 01/2012 X 2; 04/2012  . History of gout   . History of lower GI bleeding   . Hypertension   . Iron deficiency anemia   . Mitral regurgitation    a. Moderate by echo, 02/2012  . Myocardial infarction ([place])   . Ovarian cancer ([place]) 1992  . Pneumonia ~ 2009  . PUD (peptic ulcer disease)   . TIA (transient ischemic attack)     Patient Active  Problem List   Diagnosis Date Noted  . Angina pectoris ([place]) 06/05/2019  . Chest pain 06/03/2019  . Rectal bleeding 05/14/2019  . Small intestinal bacterial overgrowth 05/14/2019  . Iron deficiency anemia 04/02/2019  . GI bleed 03/08/2019  . Gastrointestinal hemorrhage with melena 03/06/2019  . Acute respiratory failure with hypoxia ([place]) 12/25/2018  . Elevated troponin 12/14/2018  . Chest pain at rest 07/13/2018  . Hand steal syndrome ([place]) 08/01/2017  . Anemia 07/14/2017  . Coronary artery disease of bypass graft of native heart with stable angina pectoris ([place]) 06/05/2017  . Mesenteric ischemia ([name])   . Diverticulitis   . SBO (small bowel obstruction) ([place]) 03/22/2017  . Enteritis   . Complication of vascular access for dialysis 03/19/2017  . Preoperative clearance 01/25/2017  . Symptomatic anemia 10/24/2016  . H/O non-ST elevation myocardial infarction (NSTEMI) 10/24/2016  . Fluid overload 10/10/2016  . Complication from renal dialysis device 10/10/2016  . Non-ST elevation MI (NSTEMI) ([place])   . Encounter for fitting and adjustment of vascular catheter   . Heme positive stool   . Demand ischemia ([employer]) 07/27/2016  . Hypertensive emergency 07/08/2016  . Acute on chronic respiratory failure with hypoxia ([place])   . Cardiac arrest ([place])   . Palliative care  encounter   . Goals of care, counseling/discussion   . Hypertensive crisis without congestive heart failure 05/09/2016  . Acute pulmonary edema ([place]) 04/06/2016  . Acute respiratory failure ([place]) 04/06/2016  . Hypertensive crisis 01/27/2016  . History of colon cancer 01/27/2016  . History of ovarian cancer 01/27/2016  . Hypertensive urgency 01/27/2016  . AF (paroxysmal atrial fibrillation) ([place]) 10/14/2015  . Coronary angioplasty status 10/14/2015  . Malignant neoplasm of right ovary ([place]) 10/14/2015  . Narrow complex tachycardia ([place]) 09/08/2015  . SVT (supraventricular tachycardia) ([place]) 09/08/2015  . Influenza A  08/30/2015  . Acute on chronic diastolic CHF (congestive heart failure) ([place]) 05/04/2015  . Unstable angina ([place]) 05/03/2015  . Essential hypertension   . Pain in joint, lower leg 08/14/2014  . Dacryocystitis 05/29/2013  . Chronic diastolic CHF (congestive heart failure) ([place]) 03/22/2013  . GI bleeding 03/21/2013  . Acute blood loss anemia 03/21/2013  . Vaginal odor 03/12/2013  . Vaginal discharge 03/12/2013  . Occlusion and stenosis of carotid artery without mention of cerebral infarction 01/24/2013  . Hx of CABG 07/05/2012  . Carotid artery disease ([place]) 07/05/2012  . Mitral regurgitation 06/12/2012  . Pneumonia 06/09/2012  . Non-STEMI (non-ST elevated myocardial infarction) ([place]) 06/08/2012  . Ischemic chest pain ([place]) 03/01/2012  . 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 GERD (gastroesophageal reflux disease) 01/09/2012  . HLD (hyperlipidemia) 01/05/2012  . Atherosclerotic heart disease of native coronary artery without angina pectoris 12/16/2011  . ESRD on hemodialysis ([place]) 12/16/2011  . Anxiety disorder 05/04/2011  . Anemia in chronic kidney disease 04/29/2011  . Secondary hyperparathyroidism of renal origin ([place]) 04/29/2011  . End stage renal disease ([place]) 04/29/2011  . Gout 04/29/2011  . Hypertensive chronic kidney disease with stage 5 chronic kidney disease or end stage renal disease ([place]) 04/29/2011    Past Surgical History:  Procedure Laterality Date  . A/V SHUNTOGRAM Left 03/19/2019   Procedure: A/V SHUNTOGRAM;  Surgeon: Katha Cabal, MD;  Location: [place] CV LAB;  Service: Cardiovascular;  Laterality: Left;  . ABDOMINAL HYSTERECTOMY  1992  . APPENDECTOMY  06/1990  . AV FISTULA PLACEMENT  07/2009   left upper arm  . AV FISTULA PLACEMENT Right 09/06/2016   Procedure: RIGHT FOREARM ARTERIOVENOUS (AV) GRAFT;  Surgeon: Elam Dutch, MD;  Location: [facility] OR;  Service: Vascular;  Laterality: Right;  . AV FISTULA PLACEMENT N/A  02/24/2017   Procedure: INSERTION OF ARTERIOVENOUS (AV) GORE-TEX GRAFT ARM (BRACHIAL AXILLARY);  Surgeon: Katha Cabal, MD;  Location: [facility] ORS;  Service: Vascular;  Laterality: N/A;  . [place] Right 09/06/2016   Procedure: REMOVAL OF Right Arm ARTERIOVENOUS GORETEX GRAFT and Vein Patch angioplasty of brachial artery;  Surgeon: Angelia Mould, MD;  Location: [place];  Service: Vascular;  Laterality: Right;  . COLON RESECTION  1992  . COLON SURGERY    . COLONOSCOPY N/A 03/09/2019   Procedure: COLONOSCOPY;  Surgeon: Rogene Houston, MD;  [facility];  Service: Endoscopy;  Laterality: N/A;  . CORONARY ANGIOPLASTY WITH STENT PLACEMENT  12/15/11   "2"  . CORONARY ANGIOPLASTY WITH STENT PLACEMENT  y/2013   "1; makes total of 3" (05/02/2012)  . CORONARY ARTERY BYPASS GRAFT  06/13/2012   Procedure: CORONARY ARTERY BYPASS GRAFTING (CABG);  Surgeon: Grace Isaac, MD;  Location: [place];  Service: Open Heart Surgery;  Laterality: N/A;  cabg x four;  using left internal mammary artery, and left leg greater saphenous vein harvested endoscopically  .  CORONARY STENT INTERVENTION N/A 10/13/2016   Procedure: Coronary Stent Intervention;  Surgeon: Troy Sine, MD;  Location: [place] CV LAB;  Service: Cardiovascular;  Laterality: N/A;  . DIALYSIS/PERMA CATHETER REMOVAL N/A 04/18/2017   Procedure: DIALYSIS/PERMA CATHETER REMOVAL;  Surgeon: Katha Cabal, MD;  Location: [place] CV LAB;  Service: Cardiovascular;  Laterality: N/A;  . DILATION AND CURETTAGE OF UTERUS    . ESOPHAGOGASTRODUODENOSCOPY  01/20/2012   Procedure: ESOPHAGOGASTRODUODENOSCOPY (EGD);  Surgeon: Ladene Artist, MD,FACG;  Location: [facility] ENDOSCOPY;  Service: Endoscopy;  Laterality: N/A;  . ESOPHAGOGASTRODUODENOSCOPY N/A 03/26/2013   Procedure: ESOPHAGOGASTRODUODENOSCOPY (EGD);  Surgeon: Irene Shipper, MD;  Location: [facility] ENDOSCOPY;  Service: Endoscopy;  Laterality: N/A;  . ESOPHAGOGASTRODUODENOSCOPY N/A 04/30/2015    Procedure: ESOPHAGOGASTRODUODENOSCOPY (EGD);  Surgeon: Rogene Houston, MD;  [facility];  Service: Endoscopy;  Laterality: N/A;  1pm - moved to 10/20 @ 1:10  . ESOPHAGOGASTRODUODENOSCOPY N/A 07/29/2016   Procedure: ESOPHAGOGASTRODUODENOSCOPY (EGD);  Surgeon: Manus Gunning, MD;  Location: [place];  Service: Gastroenterology;  Laterality: N/A;  enteroscopy  . GIVENS CAPSULE STUDY N/A 03/07/2019   Procedure: GIVENS CAPSULE STUDY;  Surgeon: Rogene Houston, MD;  [facility];  Service: Endoscopy;  Laterality: N/A;  7:30  . INTRAOPERATIVE TRANSESOPHAGEAL ECHOCARDIOGRAM  06/13/2012   Procedure: INTRAOPERATIVE TRANSESOPHAGEAL ECHOCARDIOGRAM;  Surgeon: Grace Isaac, MD;  Location: [place];  Service: Open Heart Surgery;  Laterality: N/A;  . IR GENERIC HISTORICAL  07/26/2016   IR FLUORO GUIDE CV LINE RIGHT 07/26/2016 Greggory Keen, MD [facility]INTER[facility]  . IR GENERIC HISTORICAL  07/26/2016   IR US GUIDE VASC ACCESS RIGHT 07/26/2016 Greggory Keen, MD [facility]  . IR GENERIC HISTORICAL  08/02/2016   IR US GUIDE VASC ACCESS RIGHT 08/02/2016 Greggory Keen, MD [facility]  . IR GENERIC HISTORICAL  08/02/2016   IR FLUORO GUIDE CV LINE RIGHT 08/02/2016 Greggory Keen, MD [facility]  . IR RADIOLOGY PERIPHERAL GUIDED IV START  03/28/2017  . IR US GUIDE VASC ACCESS RIGHT  03/28/2017  . LEFT HEART CATH AND CORONARY ANGIOGRAPHY N/A 09/20/2016   Procedure: Left Heart Cath and Coronary Angiography;  Surgeon: [name], MD;  Location: [place] CV LAB;  Service: Cardiovascular;  Laterality: N/A;  . LEFT HEART CATH AND CORS/GRAFTS ANGIOGRAPHY N/A 10/13/2016   Procedure: Left Heart Cath and Cors/Grafts Angiography;  Surgeon: Troy Sine, MD;  Location: [place] CV LAB;  Service: Cardiovascular;  Laterality: N/A;  . LEFT HEART CATH AND CORS/GRAFTS ANGIOGRAPHY N/A 07/13/2018   Procedure: LEFT HEART CATH AND CORS/GRAFTS ANGIOGRAPHY;  Surgeon: Martinique, Peter M, MD;  Location: [place] CV LAB;  Service: Cardiovascular;  Laterality: N/A;  . LEFT HEART CATHETERIZATION WITH CORONARY ANGIOGRAM N/A 12/15/2011   Procedure: LEFT HEART CATHETERIZATION WITH CORONARY ANGIOGRAM;  Surgeon: Burnell Blanks, MD;  Location: [facility] CATH LAB;  Service: Cardiovascular;  Laterality: N/A;  . LEFT HEART CATHETERIZATION WITH CORONARY ANGIOGRAM N/A 01/10/2012   Procedure: LEFT HEART CATHETERIZATION WITH CORONARY ANGIOGRAM;  Surgeon: Peter M Martinique, MD;  Location: [facility] CATH LAB;  Service: Cardiovascular;  Laterality: N/A;  . LEFT HEART CATHETERIZATION WITH CORONARY ANGIOGRAM N/A 06/08/2012   Procedure: LEFT HEART CATHETERIZATION WITH CORONARY ANGIOGRAM;  Surgeon: Burnell Blanks, MD;  Location: [facility] CATH LAB;  Service: Cardiovascular;  Laterality: N/A;  . LEFT HEART CATHETERIZATION WITH CORONARY/GRAFT ANGIOGRAM N/A 12/10/2013   Procedure: LEFT HEART CATHETERIZATION WITH Beatrix Fetters;  Surgeon: [name], MD;  Location: [facility]  CATH LAB;  Service: Cardiovascular;  Laterality: N/A;  . OVARY SURGERY     ovarian cancer  . POLYPECTOMY  03/09/2019   Procedure: POLYPECTOMY;  Surgeon: Rogene Houston, MD;  [facility];  Service: Endoscopy;;  cecal   . REVISION OF ARTERIOVENOUS GORETEX GRAFT N/A 02/24/2017   Procedure: REVISION OF ARTERIOVENOUS GORETEX GRAFT (RESECTION);  Surgeon: Katha Cabal, MD;  Location: [facility] ORS;  Service: Vascular;  Laterality: N/A;  Earney Mallet N/A 10/15/2013   Procedure: Fistulogram;  Surgeon: Serafina Mitchell, MD;  Location: [facility] CATH LAB;  Service: Cardiovascular;  Laterality: N/A;  . THROMBECTOMY / ARTERIOVENOUS GRAFT REVISION  2011   left upper arm  . TUBAL LIGATION  1980's  . UPPER EXTREMITY ANGIOGRAPHY Bilateral 12/06/2016   Procedure: Upper Extremity Angiography;  Surgeon: Katha Cabal, MD;  Location: [place] CV LAB;  Service: Cardiovascular;  Laterality: Bilateral;  . UPPER EXTREMITY INTERVENTION Left 06/06/2017    Procedure: UPPER EXTREMITY INTERVENTION;  Surgeon: Katha Cabal, MD;  Location: [place] CV LAB;  Service: Cardiovascular;  Laterality: Left;     OB History    Gravida  2   Para  2   Term      Preterm  2   AB      Living  2     SAB      TAB      Ectopic      Multiple      Live Births               Home Medications    Prior to Admission medications   Medication Sig Start Date End Date Taking? Authorizing Provider  ALPRAZolam Duanne Moron) 0.5 MG tablet Take 0.5 mg by mouth daily. 10/16/18   [provider]  amiodarone (PACERONE) 200 MG tablet Take 1 tablet (200 mg total) by mouth daily. 02/19/19   Herminio Commons, MD  aspirin EC 81 MG tablet Take 1 tablet (81 mg total) by mouth daily. Restart on 03/13/19 03/13/19   Orson Eva, MD  benzonatate (TESSALON) 100 MG capsule TAKE 1 CAPSULE BY MOUTH THREE TIMES DAILY AS NEEDED FOR COUGH 03/19/19   [provider]  carvedilol (COREG) 12.5 MG tablet Take 1 tablet (12.5 mg total) by mouth 2 (two) times daily with a meal. Additional refills per PCP Patient taking differently: Take 12.5 mg by mouth every morning. Additional refills per PCP 03/13/19 06/13/19  Herminio Commons, MD  cloNIDine (CATAPRES) 0.1 MG tablet Take 1 tablet (0.1 mg total) by mouth 2 (two) times daily. Patient taking differently: Take 0.1 mg by mouth 2 (two) times daily as needed.  02/01/19   Daune Perch, NP  diphenhydrAMINE (BENADRYL) 25 mg capsule Take 50 mg by mouth daily as needed for itching or allergies.     [provider]  felodipine (PLENDIL) 2.5 MG 24 hr tablet Take 1 tablet (2.5 mg total) by mouth daily. 06/06/19   Orson Eva, MD  fluticasone (FLONASE) 50 MCG/ACT nasal spray Place 1 spray at bedtime as needed into both nostrils for allergies.     [provider]  gabapentin (NEURONTIN) 100 MG capsule Take 100 mg by mouth at bedtime. 06/10/19   [provider]  hyoscyamine (LEVSIN SL) 0.125 MG SL tablet  Place 1 tablet (0.125 mg total) under the tongue 3 (three) times daily. 05/16/19   Rogene Houston, MD  isosorbide mononitrate (IMDUR) 60 MG 24 hr tablet Take 1 tablet (60 mg total)  by mouth 2 (two) times daily. 06/05/19   Orson Eva, MD  lidocaine-prilocaine (EMLA) cream Apply 1 application topically every Monday, Wednesday, and Friday. Prior to dialysis    [provider]  loratadine (CLARITIN) 10 MG tablet Take 1 tablet (10 mg total) by mouth daily as needed for allergies. 07/14/18   Hosie Poisson, MD  multivitamin (RENA-VIT) TABS tablet Take 1 tablet by mouth daily.    [provider]  nitroGLYCERIN (NITROSTAT) 0.4 MG SL tablet DISSOLVE ONE TABLET UNDER THE TONGUE EVERY 5 MINUTES AS NEEDED FOR CHEST PAIN.  DO NOT EXCEED A TOTAL OF 3 DOSES IN 15 MINUTES 10/08/18   Herminio Commons, MD  nystatin ointment (MYCOSTATIN) APPLY A SMALL AMOUNT TOPICALLY TWICE DAILY FOR 7 DAYS TO EXTERNAL ANAL AREA FOR ANAL ITCHINESS 03/21/19   [provider]  omeprazole (PRILOSEC) 20 MG capsule Take 2 capsules (40 mg total) by mouth 2 (two) times daily before a meal. 06/05/19   Tat, Shanon Brow, MD  ondansetron (ZOFRAN-ODT) 4 MG disintegrating tablet DISSOLVE 1 TABLET IN MOUTH TWICE DAILY AS NEEDED 03/05/19   [provider]  rosuvastatin (CRESTOR) 10 MG tablet Take 1 tablet (10 mg total) by mouth daily. 06/05/19   Orson Eva, MD  sevelamer carbonate (RENVELA) 800 MG tablet TAKE 3 TABLETS BY MOUTH THREE TIMES DAILY WITH MEALS AND 2 TABLETS WITH SNACKS 04/03/19   [provider]    Family History Family History  Problem Relation Age of Onset  . Heart disease Mother        Heart Disease before age 3  . Hyperlipidemia Mother   . Hypertension Mother   . Diabetes Mother   . Heart attack Mother   . Heart disease Father        Heart Disease before age 40  . Hyperlipidemia Father   . Hypertension Father   . Diabetes Father   . Diabetes Sister   . Hypertension Sister   .  Diabetes Brother   . Hyperlipidemia Brother   . Heart attack Brother   . Hypertension Sister   . Heart attack Brother   . Colon cancer Child 27  . Other Other        noncontributory for early CAD  . Esophageal cancer Neg Hx   . Liver disease Neg Hx   . Kidney disease Neg Hx   . Colon polyps Neg Hx     Social History Social History   Tobacco Use  . Smoking status: Never Smoker  . Smokeless tobacco: Never Used  Substance Use Topics  . Alcohol use: No    Alcohol/week: 0.0 standard drinks  . Drug use: No     Allergies   Aspirin, Penicillins, Amlodipine, Bactrim [sulfamethoxazole-trimethoprim], Contrast media [iodinated diagnostic agents], Iron, Nitrofurantoin, Tylenol [acetaminophen], Gabapentin, Hydralazine, Dexilant [dexlansoprazole], Levaquin [levofloxacin in d5w], Morphine and related, Plavix [clopidogrel bisulfate], Protonix [pantoprazole sodium], and Venofer [ferric oxide]   Review of Systems Review of Systems  All other systems reviewed and are negative.    Physical Exam Updated Vital Signs BP (!) 234/99   Pulse (!) 102   Temp 97.8 F (36.6 C) (Oral)   Resp 19   Ht 5\' 1"  (1.549 m)   Wt 55.8 kg   SpO2 99%   BMI 23.24 kg/m   Physical Exam Vitals signs and nursing note reviewed.  Constitutional:      Appearance: She is well-developed.  HENT:     Head: Normocephalic and atraumatic.  Eyes:     Conjunctiva/sclera:  Conjunctivae normal.  Neck:     Musculoskeletal: Neck supple.  Cardiovascular:     Rate and Rhythm: Normal rate and regular rhythm.  Pulmonary:     Effort: Pulmonary effort is normal.     Breath sounds: Normal breath sounds.  Abdominal:     General: Bowel sounds are normal.     Palpations: Abdomen is soft.  Musculoskeletal: Normal range of motion.  Skin:    General: Skin is warm and dry.  Neurological:     General: No focal deficit present.     Mental Status: She is alert and oriented to person, place, and time.  Psychiatric:         Behavior: Behavior normal.      ED Treatments / Results  Labs (all labs ordered are listed, but only abnormal results are displayed) Labs Reviewed  CBC - Abnormal; Notable for the following components:      Result Value   RBC 2.96 (*)    Hemoglobin 10.3 (*)    HCT 33.7 (*)    MCV 113.9 (*)    MCH 34.8 (*)    RDW 17.5 (*)    All other components within normal limits  BASIC METABOLIC PANEL  TROPONIN I (HIGH SENSITIVITY)  TROPONIN I (HIGH SENSITIVITY)    EKG EKG Interpretation  Date/Time:  Monday June 17 2019 15:09:19 EST Ventricular Rate:  119 PR Interval:  172 QRS Duration: 124 QT Interval:  339 QTC Calculation: 477 R Axis:   109 Text Interpretation: Sinus tachycardia Anterolateral infarct, acute (LAD) Baseline wander in lead(s) III >>> Acute MI <<< Confirmed by Nat Christen [phone]) on 06/17/2019 3:15:38 PM   Radiology Dg Chest 2 View  Result Date: 06/17/2019 CLINICAL DATA:  Chest pain beginning today. EXAM: CHEST - 2 VIEW COMPARISON:  06/03/2019 FINDINGS: Previous median sternotomy and CABG. Chronic cardiomegaly and aortic atherosclerosis. Mild chronic scarring at both lung bases. Possible venous hypertension but no frank edema. No consolidation, collapse or measurable effusion. No acute bone finding. IMPRESSION: Previous CABG. Chronic cardiomegaly. Possible venous hypertension. No frank edema. Electronically Signed   By: Nelson Chimes M.D.   On: 06/17/2019 13:23    Procedures Procedures (including critical care time)  Medications Ordered in ED Medications  nitroGLYCERIN 50 mg in dextrose 5 % 250 mL (0.2 mg/mL) infusion (7.5 mcg/min Intravenous Rate/Dose Change 06/17/19 1549)     Initial Impression / Assessment and Plan / ED Course  I have reviewed the triage vital signs and the nursing notes.  Pertinent labs & imaging results that were available during my care of the patient were reviewed by me and considered in my medical decision making (see chart for  details).        Patient presents with chest pain.  She is hemodynamically stable.  Will do typical cardiac work-up.  1500: Called to room by RN.  Patient complains of dyspnea.  Blood pressure 542 systolic.  Second EKG reveals T wave inversion inferior and lateral leads.  Findings were discussed with cardiologist on-call.  Will consult.  Nitroglycerin drip initiated.  Patient has remained stable.   CRITICAL CARE Performed by: Nat Christen Total critical care time: 40 minutes Critical care time was exclusive of separately billable procedures and treating other patients. Critical care was necessary to treat or prevent imminent or life-threatening deterioration. Critical care was time spent personally by me on the following activities: development of treatment plan with patient and/or surrogate as well as nursing, discussions with consultants, evaluation of patient's response  to treatment, examination of patient, obtaining history from patient or surrogate, ordering and performing treatments and interventions, ordering and review of laboratory studies, ordering and review of radiographic studies, pulse oximetry and re-evaluation of patient's condition.  Final Clinical Impressions(s) / ED Diagnoses   Final diagnoses:  Chest pain, unspecified type  Dyspnea, unspecified type    ED Discharge Orders    None       Nat Christen, MD 06/17/19 1309    Nat Christen, MD 06/17/19 1553

## 2019-06-17 NOTE — Progress Notes (Signed)
MD, Scherrie November, paged about elevated troponin of [phone] 06/17/19. Awaiting orders or response.

## 2019-06-17 NOTE — ED Notes (Signed)
EKG given to Dr. Lacinda Axon

## 2019-06-18 LAB — CBC
HCT: 29.6 % — ABNORMAL LOW (ref 36.0–46.0)
Hemoglobin: 9.2 g/dL — ABNORMAL LOW (ref 12.0–15.0)
MCH: 35.5 pg — ABNORMAL HIGH (ref 26.0–34.0)
MCHC: 31.1 g/dL (ref 30.0–36.0)
MCV: 114.3 fL — ABNORMAL HIGH (ref 80.0–100.0)
Platelets: 200 10*3/uL (ref 150–400)
RBC: 2.59 MIL/uL — ABNORMAL LOW (ref 3.87–5.11)
RDW: 17.6 % — ABNORMAL HIGH (ref 11.5–15.5)
WBC: 5 10*3/uL (ref 4.0–10.5)
nRBC: 0 % (ref 0.0–0.2)

## 2019-06-18 LAB — BASIC METABOLIC PANEL
Anion gap: 18 — ABNORMAL HIGH (ref 5–15)
BUN: 64 mg/dL — ABNORMAL HIGH (ref 8–23)
CO2: 19 mmol/L — ABNORMAL LOW (ref 22–32)
Calcium: 9.4 mg/dL (ref 8.9–10.3)
Chloride: 100 mmol/L (ref 98–111)
Creatinine, Ser: 11.94 mg/dL — ABNORMAL HIGH (ref 0.44–1.00)
GFR calc Af Amer: 3 mL/min — ABNORMAL LOW (ref >60)
GFR calc non Af Amer: 3 mL/min — ABNORMAL LOW (ref >60)
Glucose, Bld: 79 mg/dL (ref 70–99)
Potassium: 4.9 mmol/L (ref 3.5–5.1)
Sodium: 137 mmol/L (ref 135–145)

## 2019-06-18 LAB — TROPONIN I (HIGH SENSITIVITY)
Troponin I (High Sensitivity): 287 ng/L (ref <18)
Troponin I (High Sensitivity): 292 ng/L (ref <18)
Troponin I (High Sensitivity): 237 ng/L (ref <18)
Troponin I (High Sensitivity): 206 ng/L (ref <18)

## 2019-06-18 LAB — HEPATITIS B SURFACE ANTIGEN: Hepatitis B Surface Ag: NONREACTIVE

## 2019-06-18 MED ORDER — SODIUM CHLORIDE 0.9 % IV SOLN: 100.0000 mL | INTRAVENOUS | Status: DC | PRN

## 2019-06-18 MED ORDER — CHLORHEXIDINE GLUCONATE CLOTH 2 % EX PADS
6.0000 | MEDICATED_PAD | Freq: Every day | CUTANEOUS | Status: DC
  Administered 2019-06-18 – 2019-06-20 (×3): 6 via TOPICAL

## 2019-06-18 MED ORDER — LIDOCAINE-PRILOCAINE 2.5-2.5 % EX CREA: 1.0000 "application " | TOPICAL_CREAM | CUTANEOUS | Status: DC | PRN

## 2019-06-18 MED ORDER — HEPARIN SODIUM (PORCINE) 1000 UNIT/ML DIALYSIS: 1000.0000 [IU] | INTRAMUSCULAR | Status: DC | PRN

## 2019-06-18 MED ORDER — PENTAFLUOROPROP-TETRAFLUOROETH EX AERO
1.0000 "application " | INHALATION_SPRAY | CUTANEOUS | Status: DC | PRN
  Filled 2019-06-18: qty 116

## 2019-06-18 MED ORDER — DOXERCALCIFEROL 4 MCG/2ML IV SOLN: 2.5000 ug | INTRAVENOUS | Status: DC

## 2019-06-18 MED ORDER — LIDOCAINE HCL (PF) 1 % IJ SOLN: 5.0000 mL | INTRAMUSCULAR | Status: DC | PRN

## 2019-06-18 NOTE — TOC Initial Note (Signed)
Transition of Care [facility]) - Initial/Assessment Note    Patient Details  Name: Shawna [name] MRN: [id number] Date of Birth: 03-Jul-1941  [facility] [place]) CM/SW Contact:    Shade Flood, LCSW Phone Number: 06/18/2019, 2:18 PM  Clinical Narrative:                  Pt admitted from home. She is high risk for readmission. Met with pt today to assess. Per pt, she lives with her husband in their [place] home. She uses a cane for ambulation. Pt states that she is independent in ADLs at home. Her husband drives her to her HD at [facility] in [place]. Pt states she does not have any difficulty getting to her appointments or obtaining medication.   At this time, pt is anticipating return home at dc with no new TOC needs.  Assigned TOC CM will follow and assist if needed.  Expected Discharge Plan: Home/Self Care Barriers to Discharge: Continued Medical Work up   Patient Goals and CMS Choice        Expected Discharge Plan and Services Expected Discharge Plan: Home/Self Care In-house Referral: Clinical Social Work     Living arrangements for the past 2 months: Single Family Home                                      Prior Living Arrangements/Services Living arrangements for the past 2 months: Single Family Home Lives with:: Spouse Patient language and need for interpreter reviewed:: Yes Do you feel safe going back to the place where you live?: Yes      Need for Family Participation in Patient Care: Yes (Comment) Care giver support system in place?: Yes (comment) Current home services: DME Criminal Activity/Legal Involvement Pertinent to Current Situation/Hospitalization: No - Comment as needed  Activities of Daily Living Home Assistive Devices/Equipment: Cane (specify quad or straight), Eyeglasses ADL Screening (condition at time of admission) Patient's cognitive ability adequate to safely complete daily activities?: Yes Is the patient deaf or have difficulty hearing?:  No Does the patient have difficulty seeing, even when wearing glasses/contacts?: No Does the patient have difficulty concentrating, remembering, or making decisions?: No Patient able to express need for assistance with ADLs?: Yes Does the patient have difficulty dressing or bathing?: No Independently performs ADLs?: Yes (appropriate for developmental age) Does the patient have difficulty walking or climbing stairs?: No Weakness of Legs: None Weakness of Arms/Hands: None  Permission Sought/Granted                  Emotional Assessment Appearance:: Appears stated age Attitude/Demeanor/Rapport: Engaged Affect (typically observed): Pleasant Orientation: : Oriented to Self, Oriented to Place, Oriented to  Time, Oriented to Situation Alcohol / Substance Use: Not Applicable Psych Involvement: No (comment)  Admission diagnosis:  Dysphagia [R13.10] Dyspnea, unspecified type [R06.00] Chest pain, unspecified type [R07.9] Patient Active Problem List   Diagnosis Date Noted  . Angina pectoris ([place]) 06/05/2019  . Chest pain 06/03/2019  . Rectal bleeding 05/14/2019  . Small intestinal bacterial overgrowth 05/14/2019  . Iron deficiency anemia 04/02/2019  . GI bleed 03/08/2019  . Gastrointestinal hemorrhage with melena 03/06/2019  . Acute respiratory failure with hypoxia ([place]) 12/25/2018  . Elevated troponin 12/14/2018  . Chest pain at rest 07/13/2018  . Hand steal syndrome ([place]) 08/01/2017  . Anemia 07/14/2017  . Coronary artery disease of bypass graft of native heart with stable  angina pectoris ([place]) 06/05/2017  . Mesenteric ischemia ([place])   . Diverticulitis   . SBO (small bowel obstruction) ([place]) 03/22/2017  . Enteritis   . Complication of vascular access for dialysis 03/19/2017  . Preoperative clearance 01/25/2017  . Symptomatic anemia 10/24/2016  . H/O non-ST elevation myocardial infarction (NSTEMI) 10/24/2016  . Fluid overload 10/10/2016  . Complication from renal dialysis  device 10/10/2016  . Non-ST elevation MI (NSTEMI) ([place])   . Encounter for fitting and adjustment of vascular catheter   . Heme positive stool   . Demand ischemia ([place]) 07/27/2016  . Hypertensive emergency 07/08/2016  . Acute on chronic respiratory failure with hypoxia ([place])   . Cardiac arrest ([place])   . Palliative care encounter   . Goals of care, counseling/discussion   . Hypertensive crisis without congestive heart failure 05/09/2016  . Acute pulmonary edema ([place]) 04/06/2016  . Acute respiratory failure ([place]) 04/06/2016  . Hypertensive crisis 01/27/2016  . History of colon cancer 01/27/2016  . History of ovarian cancer 01/27/2016  . Hypertensive urgency 01/27/2016  . AF (paroxysmal atrial fibrillation) ([place]) 10/14/2015  . Coronary angioplasty status 10/14/2015  . Malignant neoplasm of right ovary ([place]) 10/14/2015  . Narrow complex tachycardia ([place]) 09/08/2015  . SVT (supraventricular tachycardia) ([place]) 09/08/2015  . Influenza A 08/30/2015  . Acute on chronic diastolic CHF (congestive heart failure) ([place]) 05/04/2015  . Unstable angina ([place]) 05/03/2015  . Essential hypertension   . Pain in joint, lower leg 08/14/2014  . Dacryocystitis 05/29/2013  . Chronic diastolic CHF (congestive heart failure) ([place]) 03/22/2013  . GI bleeding 03/21/2013  . Acute blood loss anemia 03/21/2013  . Vaginal odor 03/12/2013  . Vaginal discharge 03/12/2013  . Occlusion and stenosis of carotid artery without mention of cerebral infarction 01/24/2013  . Hx of CABG 07/05/2012  . Carotid artery disease ([place]) 07/05/2012  . Mitral regurgitation 06/12/2012  . Pneumonia 06/09/2012  . Non-STEMI (non-ST elevated myocardial infarction) ([place]) 06/08/2012  . Ischemic chest pain ([place]) 03/01/2012  . 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 GERD (gastroesophageal reflux disease) 01/09/2012  . HLD (hyperlipidemia) 01/05/2012  . Atherosclerotic heart disease of native coronary artery  without angina pectoris 12/16/2011  . ESRD on hemodialysis ([place]) 12/16/2011  . Anxiety disorder 05/04/2011  . Anemia in chronic kidney disease 04/29/2011  . Secondary hyperparathyroidism of renal origin ([place]) 04/29/2011  . End stage renal disease ([place]) 04/29/2011  . Gout 04/29/2011  . Hypertensive chronic kidney disease with stage 5 chronic kidney disease or end stage renal disease ([place]) 04/29/2011   PCP:  [name], MD Pharmacy:   [facility] [address] [place] [address] Phone: [phone] Fax: [phone]  [facility] (ESRD Bundle Only) - [place], [place] [address] [place] [address] Phone: [phone] Fax: [phone]  [facility] [place] [place] - [address] [place] [address] Phone: [phone] Fax: [phone]     Social Determinants of Health (SDOH) Interventions    Readmission Risk Interventions Readmission Risk Prevention Plan 06/18/2019 12/27/2018  Transportation Screening Complete Complete  PCP or Specialist Appt within 3-5 Days - Complete  HRI or [place] - Complete  Social Work Consult for [place] Planning/Counseling - Complete  Palliative Care Screening - Not Applicable  Medication Review Press photographer) Complete Complete  SW Recovery Care/Counseling Consult Complete -  Palliative  Care Screening Not Applicable -  [place] Not Applicable -  Some recent data might be hidden

## 2019-06-18 NOTE — Procedures (Signed)
    HEMODIALYSIS TREATMENT NOTE:  Difficult cannulation of AVG; 5 total attempts. 3.5 hour heparin-free treatment completed via left upper arm AVG (16g/antegrade).  Pt unable to tolerate 2.2 liter goal.  UF was suspended for SBP<120 at pt's request.  She was  quite nervous about her BP "bottom-ing out", frequently asking, "What's my pressure?"   All blood was returned and hemostasis was achieved in 15 minutes.  Net UF 2.2 liters.  [place] Alexandria, RN

## 2019-06-18 NOTE — Consult Note (Signed)
[facility] Renal Consultation Note  Requesting MD: Jenetta Downer MD Indication for Consultation:  ESRD  Chief complaint: Chest pain and shortness of breath  HPI:  Shawna [name] is a 78 y.o. female with a history including end-stage renal disease on hemodialysis Monday Wednesday Friday, chronic diastolic CHF, and hypertension who presented to the emergency department with chest pain and shortness of breath.  She was found to have hypertensive emergency with pressures as high as 234/99.  Last dialysis was the Friday preceding presentation thus she was due on 12/7.  She was started on a nitro drip and hospitalist admitted her to the ICU .  Nephrology is consulted for assistance with management of hemodialysis.  SARS coronavirus 2 negative by respiratory panel.  This morning she denies any chest pain or shortness of breath.  Reports compliance with meds and HD treatments.  Denies any dizziness or cramping and states she thinks she has lost weight.  She generally doesn't pull more than 2.5 kg per RN at outpatient unit.  Post weight 57 kg on 12/4.  Has tolerated F180 dialyzer well here before per HD RN inpatient.   PMHx:   Past Medical History:  Diagnosis Date  . Acute on chronic respiratory failure with hypoxia ([place]) 10/10/2016  . Anxiety   . Arthritis   . AVM (arteriovenous malformation) of colon   . CAD (coronary artery disease)    a. s/p CABG in 2013 b. most recent DES to D1 in 10/2016 with cath showing patent LIMA-LAD, SVG-RI, SVG-Mrg and SVG-RCA  c. NST in 02/2018 showing scar with no significant ischemia  . Carotid artery disease ([place])    a. 77-82% LICA, 10/2351   . Chronic bronchitis ([place])   . Chronic diastolic CHF (congestive heart failure) ([place])    a. 02/2012 Echo EF 60-65%, nl wall motion, Gr 1 DD, mod MR  . Colon cancer ([place]) 1992  . Esophageal stricture   . ESRD on hemodialysis ([place])    ESRD due to HTN, started dialysis 2011 and gets HD at [facility] with Dr  Hinda Lenis on MWF schedule.  Access is LUA AVF as of Sept 2014.   Marland Kitchen GERD (gastroesophageal reflux disease)   . High cholesterol 12/2011  . History of blood transfusion 07/2011; 12/2011; 01/2012 X 2; 04/2012  . History of gout   . History of lower GI bleeding   . Hypertension   . Iron deficiency anemia   . Mitral regurgitation    a. Moderate by echo, 02/2012  . Myocardial infarction ([place])   . Ovarian cancer ([place]) 1992  . Pneumonia ~ 2009  . PUD (peptic ulcer disease)   . TIA (transient ischemic attack)     Past Surgical History:  Procedure Laterality Date  . A/V SHUNTOGRAM Left 03/19/2019   Procedure: A/V SHUNTOGRAM;  Surgeon: Katha Cabal, MD;  Location: [place] [facility];  Service: Cardiovascular;  Laterality: Left;  . ABDOMINAL HYSTERECTOMY  1992  . APPENDECTOMY  06/1990  . AV FISTULA PLACEMENT  07/2009   left upper arm  . AV FISTULA PLACEMENT Right 09/06/2016   Procedure: RIGHT FOREARM ARTERIOVENOUS (AV) GRAFT;  Surgeon: Elam Dutch, MD;  Location: [facility] OR;  Service: Vascular;  Laterality: Right;  . AV FISTULA PLACEMENT N/A 02/24/2017   Procedure: INSERTION OF ARTERIOVENOUS (AV) GORE-TEX GRAFT ARM (BRACHIAL AXILLARY);  Surgeon: Katha Cabal, MD;  Location: [facility] ORS;  Service: Vascular;  Laterality: N/A;  . [place] Right 09/06/2016   Procedure: REMOVAL  OF Right Arm ARTERIOVENOUS GORETEX GRAFT and Vein Patch angioplasty of brachial artery;  Surgeon: Angelia Mould, MD;  Location: [place];  Service: Vascular;  Laterality: Right;  . COLON RESECTION  1992  . COLON SURGERY    . COLONOSCOPY N/A 03/09/2019   Procedure: COLONOSCOPY;  Surgeon: Rogene Houston, MD;  [facility];  Service: Endoscopy;  Laterality: N/A;  . CORONARY ANGIOPLASTY WITH STENT PLACEMENT  12/15/11   "2"  . CORONARY ANGIOPLASTY WITH STENT PLACEMENT  y/2013   "1; makes total of 3" (05/02/2012)  . CORONARY ARTERY BYPASS GRAFT  06/13/2012   Procedure: CORONARY ARTERY BYPASS GRAFTING (CABG);   Surgeon: Grace Isaac, MD;  Location: [place];  Service: Open Heart Surgery;  Laterality: N/A;  cabg x four;  using left internal mammary artery, and left leg greater saphenous vein harvested endoscopically  . CORONARY STENT INTERVENTION N/A 10/13/2016   Procedure: Coronary Stent Intervention;  Surgeon: Troy Sine, MD;  Location: [place] [facility];  Service: Cardiovascular;  Laterality: N/A;  . DIALYSIS/PERMA CATHETER REMOVAL N/A 04/18/2017   Procedure: DIALYSIS/PERMA CATHETER REMOVAL;  Surgeon: Katha Cabal, MD;  Location: [place] [facility];  Service: Cardiovascular;  Laterality: N/A;  . DILATION AND CURETTAGE OF UTERUS    . ESOPHAGOGASTRODUODENOSCOPY  01/20/2012   Procedure: ESOPHAGOGASTRODUODENOSCOPY (EGD);  Surgeon: Ladene Artist, MD,FACG;  Location[facility] ENDOSCOPY;  Service: Endoscopy;  Laterality: N/A;  . ESOPHAGOGASTRODUODENOSCOPY N/A 03/26/2013   Procedure: ESOPHAGOGASTRODUODENOSCOPY (EGD);  Surgeon: Irene Shipper, MD;  Location: [facility] ENDOSCOPY;  Service: Endoscopy;  Laterality: N/A;  . ESOPHAGOGASTRODUODENOSCOPY N/A 04/30/2015   Procedure: ESOPHAGOGASTRODUODENOSCOPY (EGD);  Surgeon: Rogene Houston, MD;  [facility];  Service: Endoscopy;  Laterality: N/A;  1pm - moved to 10/20 @ 1:10  . ESOPHAGOGASTRODUODENOSCOPY N/A 07/29/2016   Procedure: ESOPHAGOGASTRODUODENOSCOPY (EGD);  Surgeon: Manus Gunning, MD;  Location: [place];  Service: Gastroenterology;  Laterality: N/A;  enteroscopy  . GIVENS CAPSULE STUDY N/A 03/07/2019   Procedure: GIVENS CAPSULE STUDY;  Surgeon: Rogene Houston, MD;  [facility];  Service: Endoscopy;  Laterality: N/A;  7:30  . INTRAOPERATIVE TRANSESOPHAGEAL ECHOCARDIOGRAM  06/13/2012   Procedure: INTRAOPERATIVE TRANSESOPHAGEAL ECHOCARDIOGRAM;  Surgeon: Grace Isaac, MD;  Location: [place];  Service: Open Heart Surgery;  Laterality: N/A;  . IR GENERIC HISTORICAL  07/26/2016   IR FLUORO GUIDE CV LINE RIGHT 07/26/2016 Greggory Keen, MD [facility]  . IR GENERIC HISTORICAL  07/26/2016   IR US GUIDE VASC ACCESS RIGHT 07/26/2016 Greggory Keen, MD [facility]  . IR GENERIC HISTORICAL  08/02/2016   IR US GUIDE VASC ACCESS RIGHT 08/02/2016 Greggory Keen, MD [facility]  . IR GENERIC HISTORICAL  08/02/2016   IR FLUORO GUIDE CV LINE RIGHT 08/02/2016 Greggory Keen, MD [facility]  . IR RADIOLOGY PERIPHERAL GUIDED IV START  03/28/2017  . IR US GUIDE VASC ACCESS RIGHT  03/28/2017  . LEFT HEART CATH AND CORONARY ANGIOGRAPHY N/A 09/20/2016   Procedure: Left Heart Cath and Coronary Angiography;  Surgeon: [name], MD;  Location: [place] [facility];  Service: Cardiovascular;  Laterality: N/A;  . LEFT HEART CATH AND CORS/GRAFTS ANGIOGRAPHY N/A 10/13/2016   Procedure: Left Heart Cath and Cors/Grafts Angiography;  Surgeon: Troy Sine, MD;  Location: [place] [facility];  Service: Cardiovascular;  Laterality: N/A;  . LEFT HEART CATH AND CORS/GRAFTS ANGIOGRAPHY N/A 07/13/2018   Procedure: LEFT HEART CATH AND CORS/GRAFTS ANGIOGRAPHY;  Surgeon: Martinique, Peter  M, MD;  Location: [place] [facility];  Service: Cardiovascular;  Laterality: N/A;  . LEFT HEART CATHETERIZATION WITH CORONARY ANGIOGRAM N/A 12/15/2011   Procedure: LEFT HEART CATHETERIZATION WITH CORONARY ANGIOGRAM;  Surgeon: Burnell Blanks, MD;  Location: [facility] CATH LAB;  Service: Cardiovascular;  Laterality: N/A;  . LEFT HEART CATHETERIZATION WITH CORONARY ANGIOGRAM N/A 01/10/2012   Procedure: LEFT HEART CATHETERIZATION WITH CORONARY ANGIOGRAM;  Surgeon: Peter M Martinique, MD;  Location: [facility] CATH LAB;  Service: Cardiovascular;  Laterality: N/A;  . LEFT HEART CATHETERIZATION WITH CORONARY ANGIOGRAM N/A 06/08/2012   Procedure: LEFT HEART CATHETERIZATION WITH CORONARY ANGIOGRAM;  Surgeon: Burnell Blanks, MD;  Location: [facility] CATH LAB;  Service: Cardiovascular;  Laterality: N/A;  . LEFT HEART CATHETERIZATION WITH CORONARY/GRAFT ANGIOGRAM N/A 12/10/2013   Procedure: LEFT HEART  CATHETERIZATION WITH Beatrix Fetters;  Surgeon: [name], MD;  Location: [facility] CATH LAB;  Service: Cardiovascular;  Laterality: N/A;  . OVARY SURGERY     ovarian cancer  . POLYPECTOMY  03/09/2019   Procedure: POLYPECTOMY;  Surgeon: Rogene Houston, MD;  [facility];  Service: Endoscopy;;  cecal   . REVISION OF ARTERIOVENOUS GORETEX GRAFT N/A 02/24/2017   Procedure: REVISION OF ARTERIOVENOUS GORETEX GRAFT (RESECTION);  Surgeon: Katha Cabal, MD;  Location: [facility] ORS;  Service: Vascular;  Laterality: N/A;  Earney Mal[place] N/A 10/15/2013   Procedure: Fistulogram;  Surgeon: Serafina Mitchell, MD;  Location: [facility] CATH LAB;  Service: Cardiovascular;  Laterality: N/A;  . THROMBECTOMY / ARTERIOVENOUS GRAFT REVISION  2011   left upper arm  . TUBAL LIGATION  1980's  . UPPER EXTREMITY ANGIOGRAPHY Bilateral 12/06/2016   Procedure: Upper Extremity Angiography;  Surgeon: Katha Cabal, MD;  Location: [place] [facility];  Service: Cardiovascular;  Laterality: Bilateral;  . UPPER EXTREMITY INTERVENTION Left 06/06/2017   Procedure: UPPER EXTREMITY INTERVENTION;  Surgeon: Katha Cabal, MD;  Location: [place] [facility];  Service: Cardiovascular;  Laterality: Left;    Family Hx:  Family History  Problem Relation Age of Onset  . Heart disease Mother        Heart Disease before age 90  . Hyperlipidemia Mother   . Hypertension Mother   . Diabetes Mother   . Heart attack Mother   . Heart disease Father        Heart Disease before age 35  . Hyperlipidemia Father   . Hypertension Father   . Diabetes Father   . Diabetes Sister   . Hypertension Sister   . Diabetes Brother   . Hyperlipidemia Brother   . Heart attack Brother   . Hypertension Sister   . Heart attack Brother   . Colon cancer Child 64  . Other Other        noncontributory for early CAD  . Esophageal cancer Neg Hx   . Liver disease Neg Hx   . Kidney disease Neg Hx   . Colon polyps Neg Hx      Social History:  reports that she has never smoked. She has never used smokeless tobacco. She reports that she does not drink alcohol or use drugs.  Allergies:  Allergies  Allergen Reactions  . Aspirin Other (See Comments)    High Doses Mess up her stomach; "makes my bowels have blood in them". Takes 81 mg EC Aspirin   . Penicillins Other (See Comments)    SYNCOPE? , "makes me real weak when I take it; like I'll pass out"  Has patient had a  PCN reaction causing immediate rash, facial/tongue/throat swelling, SOB or lightheadedness with hypotension: Yes Has patient had a PCN reaction causing severe rash involving mucus membranes or skin necrosis: no Has patient had a PCN reaction that required hospitalization no Has patient had a PCN reaction occurring within the last 10 years: no If all of the above  . Amlodipine Swelling  . Bactrim [Sulfamethoxazole-Trimethoprim] Rash  . Contrast Media [Iodinated Diagnostic Agents] Itching  . Iron Itching and Other (See Comments)    "they gave me iron in dialysis; had to give me Benadryl cause I had to have the iron" (05/02/2012)  . Nitrofurantoin Hives  . Tylenol [Acetaminophen] Itching and Other (See Comments)    Makes her feet on fire per pt  . Gabapentin Other (See Comments)    Unknown reaction  . Hydralazine Itching  . Dexilant [Dexlansoprazole] Other (See Comments)    Upset stomach  . Levaquin [Levofloxacin In D5w] Rash  . Morphine And Related Itching and Other (See Comments)    Itching in feet  . Plavix [Clopidogrel Bisulfate] Rash  . Protonix [Pantoprazole Sodium] Rash  . Venofer [Ferric Oxide] Itching and Other (See Comments)    Patient reports using Benadryl prior to doses as [place]    Medications: Prior to Admission medications   Medication Sig Start Date End Date Taking? Authorizing Provider  ALPRAZolam Duanne Moron) 0.5 MG tablet Take 0.5 mg by mouth daily as needed for anxiety.  10/16/18  Yes [provider]   amiodarone (PACERONE) 200 MG tablet Take 1 tablet (200 mg total) by mouth daily. 02/19/19  Yes Herminio Commons, MD  aspirin EC 81 MG tablet Take 1 tablet (81 mg total) by mouth daily. Restart on 03/13/19 03/13/19  Yes Tat, Shanon Brow, MD  benzonatate (TESSALON) 100 MG capsule Take 100 mg by mouth 3 (three) times daily as needed for cough.  03/19/19  Yes [provider]  carvedilol (COREG) 12.5 MG tablet Take 1 tablet (12.5 mg total) by mouth 2 (two) times daily with a meal. Additional refills per PCP Patient taking differently: Take 12.5 mg by mouth daily as needed (for high blood pressure levels).  03/13/19 06/17/19 Yes Herminio Commons, MD  cloNIDine (CATAPRES) 0.1 MG tablet Take 1 tablet (0.1 mg total) by mouth 2 (two) times daily. Patient taking differently: Take 0.1 mg by mouth 2 (two) times daily as needed (for blood pressure levels).  02/01/19  Yes Daune Perch, NP  felodipine (PLENDIL) 2.5 MG 24 hr tablet Take 1 tablet (2.5 mg total) by mouth daily. Patient taking differently: Take 2.5 mg by mouth daily as needed (for high blood pressure levels).  06/06/19  Yes Tat, Shanon Brow, MD  fluticasone (FLONASE) 50 MCG/ACT nasal spray Place 1 spray at bedtime as needed into both nostrils for allergies.    Yes [provider]  gabapentin (NEURONTIN) 100 MG capsule Take 100 mg by mouth at bedtime. 06/10/19  Yes [provider]  isosorbide mononitrate (IMDUR) 60 MG 24 hr tablet Take 1 tablet (60 mg total) by mouth 2 (two) times daily. 06/05/19  Yes TatShanon Brow, MD  lidocaine-prilocaine (EMLA) cream Apply 1 application topically every Monday, Wednesday, and Friday. Prior to dialysis   Yes [provider]  loratadine (CLARITIN) 10 MG tablet Take 1 tablet (10 mg total) by mouth daily as needed for allergies. 07/14/18  Yes Hosie Poisson, MD  multivitamin (RENA-VIT) TABS tablet Take 1 tablet by mouth daily.   Yes [provider]  nitroGLYCERIN (NITROSTAT) 0.4  MG SL tablet  DISSOLVE ONE TABLET UNDER THE TONGUE EVERY 5 MINUTES AS NEEDED FOR CHEST PAIN.  DO NOT EXCEED A TOTAL OF 3 DOSES IN 15 MINUTES Patient taking differently: Place 0.4 mg under the tongue every 5 (five) minutes as needed for chest pain.  10/08/18  Yes Herminio Commons, MD  omeprazole (PRILOSEC) 20 MG capsule Take 2 capsules (40 mg total) by mouth 2 (two) times daily before a meal. Patient taking differently: Take 20 mg by mouth 2 (two) times daily before a meal.  06/05/19  Yes Tat, Shanon Brow, MD  ondansetron (ZOFRAN-ODT) 4 MG disintegrating tablet Take 4 mg by mouth every 8 (eight) hours as needed for nausea or vomiting.  03/05/19  Yes [provider]  rosuvastatin (CRESTOR) 10 MG tablet Take 1 tablet (10 mg total) by mouth daily. Patient taking differently: Take 10 mg by mouth every evening.  06/05/19  Yes Tat, Shanon Brow, MD  sevelamer carbonate (RENVELA) 800 MG tablet Take 1,600-2,400 mg by mouth See admin instructions. Take three tablets with meals and two tablets with snacks 04/03/19  Yes [provider]  hyoscyamine (LEVSIN SL) 0.125 MG SL tablet Place 1 tablet (0.125 mg total) under the tongue 3 (three) times daily. Patient not taking: Reported on 06/17/2019 05/16/19   Rogene Houston, MD  nystatin ointment (MYCOSTATIN) APPLY A SMALL AMOUNT TOPICALLY TWICE DAILY FOR 7 DAYS TO EXTERNAL ANAL AREA FOR ANAL ITCHINESS 03/21/19   [provider]    I have reviewed the patient's current medications.  Labs:  BMP Latest Ref Rng & Units 06/18/2019 06/17/2019 06/05/2019  Glucose 70 - 99 mg/dL 79 84 92  BUN 8 - 23 mg/dL 64(H) 56(H) 71(H)  Creatinine 0.44 - 1.00 mg/dL 11.94(H) 10.49(H) 9.37(H)  BUN/Creat Ratio 6 - 22 (calc) - - -  Sodium 135 - 145 mmol/L 137 139 134(L)  Potassium 3.5 - 5.1 mmol/L 4.9 4.2 4.3  Chloride 98 - 111 mmol/L 100 94(L) 94(L)  CO2 22 - 32 mmol/L 19(L) 27 25  Calcium 8.9 - 10.3 mg/dL 9.4 10.0 9.4    ROS:  Pertinent items noted in HPI and remainder of  comprehensive ROS otherwise negative.   Physical Exam: Vitals:   06/18/19 0700 06/18/19 0715  BP: (!) 152/60 (!) 158/58  Pulse: 70 68  Resp: 19 20  Temp:    SpO2: 100% 100%     General: adult female NAD  HEENT: NCAT  Eyes: EOMI sclera anicteric Neck: supple trachea midline Heart: S1S2 ;no rub Lungs: clear to auscultation bilaterally; normal work of breathing at rest on 0.5 L nasal cannula  Abdomen: soft/nt/nd; normal bowel sounds Extremities: no edema appreciated Skin: no rash on extremities exposed  Neuro: alert and oriented x 3; follows commands and provides history  Access: LUE AVG with bruit and thrill   Dialysis Orders:  MWF  Duration 3.5 hours  BF 300  DF 600  Dialyzer - nipro cellent dialyzer (seems to have a sensitivity to other revaclear dialyzer - RN not aware of reaction as not documented EDW 56.5 kg - generally doesn't pull more than 2.5 kg Bath 2 K/2.5 ca; Na 138 ESA - epogen 8000 units three times a week; venofer 50 mg weekly  Bone mineral hectorol 2.5 mcg three times a week  No heparin  LUE AVG 15 gauge needles   Assessment/Plan:  # ESRD  - HD today; normally per MWF schedule  - Assess dialysis needs daily  # HTN emergency  - Nitro gtt  -  She has been initiated on oral meds - For HD today - challenge EDW by 0.5 kg as tolerated   # Chest pain with hx CAD - Cardiology felt EKG findings reflective of HTN emergency and recommended blood pressure control per charting  - Per primary team   # Chronic diastolic CHF  - optimize volume with HD   # Anemia of chronic disease  - Follow for need for ESA   # Secondary hyperparathyroidism - resume hectorol. on sevelamer; on renal diet  Claudia Desanctis 06/18/2019, 8:17 AM

## 2019-06-18 NOTE — Progress Notes (Signed)
PROGRESS NOTE    Shawna DUTKIEWICZ  [name]:[id number] DOB: [birth year] DOA: 06/17/2019 PCP: [name], MD   Brief Narrative:  Per HPI: Shawna [name] is a 78 y.o. female with medical history significant for ESRD, CABG 7672, diastolic CHF, hypertension, cardiac arrest, atrial fibrillation, GI bleed- small bowel AVMs.  Patient presented to the ED with complaints of 2 episodes of chest pain that started early this morning when she was about to go to bed sleep- at about 2 AM and then again at about 10 AM when she was about to eat.  Chest pain improved with nitro both times.  She reports intermittent chest pain lasting a 50 minutes over the past week.  She describes pain as pressure-like and "hurting".  Chest pain is nonradiating.  She reports compliance with her medications HD sessions. Patient was supposed to get dialyzed today, her schedule is Monday Wednesday Friday.  Last HD was on Friday.  Recent hospitalization 11/23-11/25-chest pain/angina.  Medications were adjusted including discontinuing Brilinta, due to chronic blood loss anemia and adding Plendil.  12/8: Patient was admitted with chest pain secondary to hypertensive urgency and was started on IV nitroglycerin drip with no further chest pain noted this morning.  She was noted to have troponin elevations which appear to be in the range of demand ischemia with no new EKG changes noted.  Discussed with cardiology and will defer consultation for now.  We will plan to wean nitroglycerin drip and plan for hemodialysis per nephrology today as she had missed her session yesterday.  Assessment & Plan:   Active Problems:   Hypertensive urgency   Chest pain secondary to hypertensive urgency-resolved -Patient has poor compliance with home clonidine and states that her blood pressures seem to get too low and she will hold clonidine on occasion -Wean nitroglycerin drip today -Continue home medications of clonidine and carvedilol  Elevated troponin  secondary to demand ischemia from above -This is in setting of CAD with prior history of CABG in 2013, Brilinta recently discontinued due to GI bleed -Continue to trend and this has been mostly flat -Discussed with Dr. Bronson Ing with no need for further evaluation at this time -Continue home aspirin and statins -Trend further troponins in a.m. and monitor on telemetry  ESRD on HD MWF -Plans for hemodialysis per nephrology today  History of paroxysmal atrial fibrillation -Currently in sinus rhythm and rate controlled -Continue amiodarone, carvedilol, and Plendil -No systemic anticoagulation due to history of GI bleed  Diastolic CHF-stable -Volume status per nephrology  History of GI bleed -Noted prior small bowel AVMs -Continue aspirin with Brilinta recently discontinued  DVT prophylaxis: Lovenox Code Status: Full Family Communication: None at bedside Disposition Plan: Continue monitoring stepdown unit and wean nitroglycerin drip and monitor blood pressures carefully.  Continue home medications as otherwise prescribed.  Hemodialysis per nephrology.  Anticipate discharge by a.m. if blood pressure stable and no further chest pain.   Consultants:   Nephrology  Procedures:   None  Antimicrobials:  Anti-infectives (From admission, onward)   None       Subjective: Patient seen and evaluated today with no new acute complaints or concerns. No acute concerns or events noted overnight.  She denies any chest pain or shortness of breath and blood pressures are better controlled this morning.  She remains on some nasal cannula oxygen.  Objective: Vitals:   06/18/19 0700 06/18/19 0715 06/18/19 0800 06/18/19 0845  BP: (!) 152/60 (!) 158/58 (!) 148/56   Pulse: 70 68 69  Resp: 19 20 13    Temp:    97.7 F (36.5 C)  TempSrc:    Axillary  SpO2: 100% 100% 100%   Weight:      Height:        Intake/Output Summary (Last 24 hours) at 06/18/2019 1115 Last data filed at 06/18/2019  0654 Gross per 24 hour  Intake 325.02 ml  Output --  Net 325.02 ml   Filed Weights   06/17/19 1120 06/17/19 1826 06/18/19 0500  Weight: 55.8 kg 58 kg 58.2 kg    Examination:  General exam: Appears calm and comfortable  Respiratory system: Clear to auscultation. Respiratory effort normal.  Currently on 2 L nasal cannula oxygen. Cardiovascular system: S1 & S2 heard, RRR. No JVD, murmurs, rubs, gallops or clicks. No pedal edema. Gastrointestinal system: Abdomen is nondistended, soft and nontender. No organomegaly or masses felt. Normal bowel sounds heard. Central nervous system: Alert and oriented. No focal neurological deficits. Extremities: Symmetric 5 x 5 power. Skin: No rashes, lesions or ulcers Psychiatry: Judgement and insight appear normal. Mood & affect appropriate.     Data Reviewed: I have personally reviewed following labs and imaging studies  CBC: Recent Labs  Lab 06/17/19 1429 06/18/19 0852  WBC 6.2 5.0  HGB 10.3* 9.2*  HCT 33.7* 29.6*  MCV 113.9* 114.3*  PLT 232 527   Basic Metabolic Panel: Recent Labs  Lab 06/17/19 1429 06/18/19 0427  NA 139 137  K 4.2 4.9  CL 94* 100  CO2 27 19*  GLUCOSE 84 79  BUN 56* 64*  CREATININE 10.49* 11.94*  CALCIUM 10.0 9.4   GFR: Estimated Creatinine Clearance: 3.1 mL/min (A) (by C-G formula based on SCr of 11.94 mg/dL (H)). Liver Function Tests: No results for input(s): AST, ALT, ALKPHOS, BILITOT, PROT, ALBUMIN in the last 168 hours. No results for input(s): LIPASE, AMYLASE in the last 168 hours. No results for input(s): AMMONIA in the last 168 hours. Coagulation Profile: No results for input(s): INR, PROTIME in the last 168 hours. Cardiac Enzymes: No results for input(s): CKTOTAL, CKMB, CKMBINDEX, TROPONINI in the last 168 hours. BNP (last 3 results) No results for input(s): PROBNP in the last 8760 hours. HbA1C: No results for input(s): HGBA1C in the last 72 hours. CBG: No results for input(s): GLUCAP in the  last 168 hours. Lipid Profile: No results for input(s): CHOL, HDL, LDLCALC, TRIG, CHOLHDL, LDLDIRECT in the last 72 hours. Thyroid Function Tests: No results for input(s): TSH, T4TOTAL, FREET4, T3FREE, THYROIDAB in the last 72 hours. Anemia Panel: No results for input(s): VITAMINB12, FOLATE, FERRITIN, TIBC, IRON, RETICCTPCT in the last 72 hours. Sepsis Labs: No results for input(s): PROCALCITON, LATICACIDVEN in the last 168 hours.  Recent Results (from the past 240 hour(s))  MRSA PCR Screening     Status: None   Collection Time: 06/17/19  6:22 PM   Specimen: Nasal Mucosa; Nasopharyngeal  Result Value Ref Range Status   MRSA by PCR NEGATIVE NEGATIVE Final    Comment:        The GeneXpert MRSA Assay (FDA approved for NASAL specimens only), is one component of a comprehensive MRSA colonization surveillance program. It is not intended to diagnose MRSA infection nor to guide or monitor treatment for MRSA infections. Performed at [name], [address] [place], [place] [address]   Respiratory Panel by RT PCR (Flu A&B, Covid) - Nasopharyngeal Swab     Status: None   Collection Time: 06/17/19  6:25 PM   Specimen: Nasopharyngeal Swab  Result Value Ref Range Status   SARS Coronavirus 2 by RT PCR NEGATIVE NEGATIVE Final    Comment: (NOTE) SARS-CoV-2 target nucleic acids are NOT DETECTED. The SARS-CoV-2 RNA is generally detectable in upper respiratoy specimens during the acute phase of infection. The lowest concentration of SARS-CoV-2 viral copies this assay can detect is 131 copies/mL. A negative result does not preclude SARS-Cov-2 infection and should not be used as the sole basis for treatment or other patient management decisions. A negative result may occur with  improper specimen collection/handling, submission of specimen other than nasopharyngeal swab, presence of viral mutation(s) within the areas targeted by this assay, and inadequate number of viral copies (<131  copies/mL). A negative result must be combined with clinical observations, patient history, and epidemiological information. The expected result is Negative. Fact Sheet for Patients:  PinkCheek.be Fact Sheet for Healthcare Providers:  GravelBags.it This test is not yet ap proved or cleared by the [place] FDA and  has been authorized for detection and/or diagnosis of SARS-CoV-2 by FDA under an Emergency Use Authorization (EUA). This EUA will remain  in effect (meaning this test can be used) for the duration of the COVID-19 declaration under Section 564(b)(1) of the Act, 21 U.S.C. section 360bbb-3(b)(1), unless the authorization is terminated or revoked sooner.    Influenza A by PCR NEGATIVE NEGATIVE Final   Influenza B by PCR NEGATIVE NEGATIVE Final    Comment: (NOTE) The Xpert Xpress SARS-CoV-2/FLU/RSV assay is intended as an aid in  the diagnosis of influenza from Nasopharyngeal swab specimens and  should not be used as a sole basis for treatment. Nasal washings and  aspirates are unacceptable for Xpert Xpress SARS-CoV-2/FLU/RSV  testing. Fact Sheet for Patients: PinkCheek.be Fact Sheet for Healthcare Providers: GravelBags.it This test is not yet approved or cleared by the [place] FDA and  has been authorized for detection and/or diagnosis of SARS-CoV-2 by  FDA under an Emergency Use Authorization (EUA). This EUA will remain  in effect (meaning this test can be used) for the duration of the  Covid-19 declaration under Section 564(b)(1) of the Act, 21  U.S.C. section 360bbb-3(b)(1), unless the authorization is  terminated or revoked. Performed at [facility], [address], [place], [place] [address]          Radiology Studies: Dg Chest 2 View  Result Date: 06/17/2019 CLINICAL DATA:  Chest pain beginning today. EXAM: CHEST - 2 VIEW COMPARISON:   06/03/2019 FINDINGS: Previous median sternotomy and CABG. Chronic cardiomegaly and aortic atherosclerosis. Mild chronic scarring at both lung bases. Possible venous hypertension but no frank edema. No consolidation, collapse or measurable effusion. No acute bone finding. IMPRESSION: Previous CABG. Chronic cardiomegaly. Possible venous hypertension. No frank edema. Electronically Signed   By: Nelson Chimes M.D.   On: 06/17/2019 13:23   Dg Chest Port 1 View  Result Date: 06/17/2019 CLINICAL DATA:  Shortness of breath and weakness EXAM: PORTABLE CHEST 1 VIEW COMPARISON:  Film from earlier in the same day. FINDINGS: Cardiac shadow is stable. Post surgical changes are again seen. Increasing patchy infiltrate is noted within the mid right lung when compare with the prior exam likely related to developing edema. No bony abnormality is noted. IMPRESSION: 1. Increasing vascular congestion with changes suggestive of asymmetric edema on the right. Electronically Signed   By: Inez Catalina M.D.   On: 06/17/2019 18:14        Scheduled Meds:  amiodarone  200 mg Oral Daily   aspirin EC  81 mg Oral Daily   carvedilol  12.5 mg Oral BID WC   Chlorhexidine Gluconate Cloth  6 each Topical Daily   Chlorhexidine Gluconate Cloth  6 each Topical Q0600   cloNIDine  0.1 mg Oral BID   [START ON 06/19/2019] doxercalciferol  2.5 mcg Intravenous Q M,W,F-HD   felodipine  2.5 mg Oral Daily   gabapentin  100 mg Oral QHS   heparin injection (subcutaneous)  5,000 Units Subcutaneous Q8H   multivitamin  1 tablet Oral Daily   pantoprazole  40 mg Oral Daily   rosuvastatin  10 mg Oral QPM   sevelamer carbonate  2,400 mg Oral TID WC   Continuous Infusions:  nitroGLYCERIN 65 mcg/min (06/18/19 0654)     LOS: 1 day    Time spent: 30 minutes    Lucca Greggs Darleen Crocker, DO [facility] Pager [phone]  If 7PM-7AM, please contact night-coverage [email or web address] Password [email or web address] 06/18/2019, 11:15 AM

## 2019-06-18 NOTE — Progress Notes (Signed)
CRITICAL VALUE ALERT  Critical Value:  Troponin 292  Date & Time Notied:  06/18/2019 0936  Provider Notified: shah  Orders Received/Actions taken: paged MD, awaiting orders

## 2019-06-18 NOTE — Progress Notes (Signed)
CRITICAL VALUE ALERT  Critical Value:  Troponin 287  Date & Time Notied:  06/18/19 @ 0001  Provider Notified: Dr. Scherrie November  Orders Received/Actions taken: Waiting for call back/orders

## 2019-06-19 LAB — CBC
HCT: 28.9 % — ABNORMAL LOW (ref 36.0–46.0)
Hemoglobin: 9 g/dL — ABNORMAL LOW (ref 12.0–15.0)
MCH: 35.6 pg — ABNORMAL HIGH (ref 26.0–34.0)
MCHC: 31.1 g/dL (ref 30.0–36.0)
MCV: 114.2 fL — ABNORMAL HIGH (ref 80.0–100.0)
Platelets: 194 10*3/uL (ref 150–400)
RBC: 2.53 MIL/uL — ABNORMAL LOW (ref 3.87–5.11)
RDW: 17.1 % — ABNORMAL HIGH (ref 11.5–15.5)
WBC: 4.4 10*3/uL (ref 4.0–10.5)
nRBC: 0 % (ref 0.0–0.2)

## 2019-06-19 LAB — RENAL FUNCTION PANEL
Albumin: 3.2 g/dL — ABNORMAL LOW (ref 3.5–5.0)
Anion gap: 13 (ref 5–15)
BUN: 35 mg/dL — ABNORMAL HIGH (ref 8–23)
CO2: 27 mmol/L (ref 22–32)
Calcium: 8.8 mg/dL — ABNORMAL LOW (ref 8.9–10.3)
Chloride: 92 mmol/L — ABNORMAL LOW (ref 98–111)
Creatinine, Ser: 7.01 mg/dL — ABNORMAL HIGH (ref 0.44–1.00)
GFR calc Af Amer: 6 mL/min — ABNORMAL LOW (ref >60)
GFR calc non Af Amer: 5 mL/min — ABNORMAL LOW (ref >60)
Glucose, Bld: 97 mg/dL (ref 70–99)
Phosphorus: 2.9 mg/dL (ref 2.5–4.6)
Potassium: 3.5 mmol/L (ref 3.5–5.1)
Sodium: 132 mmol/L — ABNORMAL LOW (ref 135–145)

## 2019-06-19 LAB — TROPONIN I (HIGH SENSITIVITY)
Troponin I (High Sensitivity): 92 ng/L — ABNORMAL HIGH (ref <18)
Troponin I (High Sensitivity): 85 ng/L — ABNORMAL HIGH (ref <18)

## 2019-06-19 MED ORDER — DIPHENHYDRAMINE HCL 25 MG PO CAPS
25.0000 mg | ORAL_CAPSULE | Freq: Four times a day (QID) | ORAL | Status: DC | PRN
  Administered 2019-06-19: 25 mg via ORAL
  Filled 2019-06-19: qty 1

## 2019-06-19 NOTE — Progress Notes (Signed)
Pt c/o itching in legs and feet. Sheets changed. Order for benadryl po obtained and given.

## 2019-06-19 NOTE — Progress Notes (Signed)
  [facility] Progress Note   Assessment/ Plan:   Dialysis Orders:  MWF [facility]  Duration 3.5 hours  BF 300  DF 600  Dialyzer - nipro cellent dialyzer--> tolerates F180 here EDW 56.5 kg Bath 2 K/2.5 ca ESA - epogen 8000 units three times a week; venofer 50 mg weekly   hectorol 2.5 mcg three times a week  No heparin   Assessment/Plan:  # ESRD: Normally MWF. off schedule yesterday 12/8.  If here tomorrow, will do off schedule tomorrow, otherwise if discharged today, would recommend CM arranging rescheduled rx tomorrow at home center with resumption of normal schedule Friday.    # HTN/ volume--> with hypertensive emergency on presentation. off nitro gtt, on PO meds with good control, probe for EDW  # Chest pain with hx CAD: improved with BP control and HD, trops down   # Anemia of chronic disease: ESA as appropriate  # Secondary hyperparathyroidism - resume hectorol. on sevelamer; on renal diet  #Dispo: OK from renal perspective to d/c   Subjective:    Off O2 this AM, BP well controlled off nitro gtt.   Objective:   BP (!) 137/45   Pulse (!) 53   Temp 98.4 F (36.9 C) (Oral)   Resp (!) 22   Ht 5\' 1"  (1.549 m)   Wt 58.9 kg   SpO2 100%   BMI 24.54 kg/m   Physical Exam: Gen: NAD, sitting up in bed sipping coffee CVS: well-healed sternotomy scar, RRR with crisp S2 Resp: normal WOB, clear anteriorly with a little muffling in bases Abd: benign Ext: no LE edema ACCESS: + RUE AVG + T/B  Labs: BMET Recent Labs  Lab 06/17/19 1429 06/18/19 0427 06/19/19 0427  NA 139 137 132*  K 4.2 4.9 3.5  CL 94* 100 92*  CO2 27 19* 27  GLUCOSE 84 79 97  BUN 56* 64* 35*  CREATININE 10.49* 11.94* 7.01*  CALCIUM 10.0 9.4 8.8*  PHOS  --   --  2.9   CBC Recent Labs  Lab 06/17/19 1429 06/18/19 0852 06/19/19 0427  WBC 6.2 5.0 4.4  HGB 10.3* 9.2* 9.0*  HCT 33.7* 29.6* 28.9*  MCV 113.9* 114.3* 114.2*  PLT 232 200 194    @IMGRELPRIORS @ Medications:     . amiodarone  200 mg Oral Daily  . aspirin EC  81 mg Oral Daily  . carvedilol  12.5 mg Oral BID WC  . Chlorhexidine Gluconate Cloth  6 each Topical Daily  . Chlorhexidine Gluconate Cloth  6 each Topical Q0600  . cloNIDine  0.1 mg Oral BID  . doxercalciferol  2.5 mcg Intravenous Q M,W,F-HD  . felodipine  2.5 mg Oral Daily  . gabapentin  100 mg Oral QHS  . heparin injection (subcutaneous)  5,000 Units Subcutaneous Q8H  . multivitamin  1 tablet Oral Daily  . pantoprazole  40 mg Oral Daily  . rosuvastatin  10 mg Oral QPM  . sevelamer carbonate  2,400 mg Oral TID WC     [name], MD 06/19/2019, 10:01 AM

## 2019-06-19 NOTE — Progress Notes (Addendum)
PROGRESS NOTE    Shawna [name]  KXF:[id number] DOB: [birth year] DOA: 06/17/2019 PCP: [name], MD   Brief Narrative:  Per HPI: Shawna [name] is a 78 y.o. female with medical history significant for ESRD, CABG 6967, diastolic CHF, hypertension, cardiac arrest, atrial fibrillation, GI bleed- small bowel AVMs.  Patient presented to the ED with complaints of 2 episodes of chest pain that started early this morning when she was about to go to bed sleep- at about 2 AM and then again at about 10 AM when she was about to eat.  Chest pain improved with nitro both times.  She reports intermittent chest pain lasting a 50 minutes over the past week.  She describes pain as pressure-like and "hurting".  Chest pain is nonradiating.  She reports compliance with her medications HD sessions. Patient was supposed to get dialyzed today, her schedule is Monday Wednesday Friday.  Last HD was on Friday.  Recent hospitalization 11/23-11/25-chest pain/angina.  Medications were adjusted including discontinuing Brilinta, due to chronic blood loss anemia and adding Plendil.  12/8: Patient was admitted with chest pain secondary to hypertensive urgency and was started on IV nitroglycerin drip with no further chest pain noted this morning.  She was noted to have troponin elevations which appear to be in the range of demand ischemia with no new EKG changes noted.  Discussed with cardiology and will defer consultation for now.  We will plan to wean nitroglycerin drip and plan for hemodialysis per nephrology today as she had missed her session yesterday.  Assessment & Plan:   Active Problems:   Hypertensive urgency   Chest pain secondary to hypertensive urgency-resolved -Patient has poor compliance with home clonidine and states that her blood pressures seem to get too low and she will hold clonidine on occasion -Wean off nitroglycerin drip  -Continue home medications of clonidine and carvedilol  Elevated troponin  secondary to demand ischemia from above -This is in setting of CAD with prior history of CABG in 2013, Brilinta recently discontinued due to GI bleed -trop trend has been mostly flat -Discussed with Dr. Bronson Ing with no need for further evaluation at this time -Continue home aspirin and statins  ESRD on HD MWF -Plans for hemodialysis per nephrology today and tomorrow  History of paroxysmal atrial fibrillation -Currently in sinus rhythm and rate controlled -Continue amiodarone, carvedilol, and Plendil -No systemic anticoagulation due to history of GI bleed  Diastolic CHF-stable -Volume status per nephrology  History of GI bleed -Noted prior small bowel AVMs -Continue aspirin   DVT prophylaxis: Lovenox Code Status: Full Family Communication: None at bedside Disposition Plan: tomorrow after dialysis  Consultants:   Nephrology  Procedures:   None  Antimicrobials:  Anti-infectives (From admission, onward)   None      Subjective: Pt complained of dropping things from her hands when trying to pick things up, and reported falling a lot out of the blue prior to presentation.  No more chest pain.  No fever, dyspnea, abdominal pain, N/V/D, increased swelling.     Objective: Vitals:   06/19/19 2000 06/19/19 2100 06/19/19 2200 06/19/19 2300  BP: (!) 149/48 (!) 157/51 (!) 162/51 (!) 157/49  Pulse: 63 63 62 63  Resp: 18 17 19  (!) 22  Temp:      TempSrc:      SpO2: 100% 100% 100% 100%  Weight:      Height:        Intake/Output Summary (Last 24 hours) at 06/19/2019 2332 Last  data filed at 06/19/2019 1700 Gross per 24 hour  Intake 700 ml  Output -  Net 700 ml   Filed Weights   06/18/19 0500 06/18/19 1300 06/19/19 0500  Weight: 58.2 kg 58.2 kg 58.9 kg    Examination:  General exam: Appears calm and comfortable  Respiratory system: Clear to auscultation. Respiratory effort normal.  Currently on 2 L nasal cannula oxygen. Cardiovascular system: S1 & S2 heard, RRR.  No JVD, murmurs, rubs, gallops or clicks. No pedal edema. Gastrointestinal system: Abdomen is nondistended, soft and nontender. No organomegaly or masses felt. Normal bowel sounds heard. Central nervous system: Alert and oriented. No focal neurological deficits. Extremities: Symmetric 5 x 5 power. Skin: No rashes, lesions or ulcers Psychiatry: Judgement and insight appear normal. Mood & affect appropriate.     Data Reviewed: I have personally reviewed following labs and imaging studies  CBC: Recent Labs  Lab 06/17/19 1429 06/18/19 0852 06/19/19 0427  WBC 6.2 5.0 4.4  HGB 10.3* 9.2* 9.0*  HCT 33.7* 29.6* 28.9*  MCV 113.9* 114.3* 114.2*  PLT 232 200 734   Basic Metabolic Panel: Recent Labs  Lab 06/17/19 1429 06/18/19 0427 06/19/19 0427  NA 139 137 132*  K 4.2 4.9 3.5  CL 94* 100 92*  CO2 27 19* 27  GLUCOSE 84 79 97  BUN 56* 64* 35*  CREATININE 10.49* 11.94* 7.01*  CALCIUM 10.0 9.4 8.8*  PHOS  --   --  2.9   GFR: Estimated Creatinine Clearance: 5.4 mL/min (A) (by C-G formula based on SCr of 7.01 mg/dL (H)). Liver Function Tests: Recent Labs  Lab 06/19/19 0427  ALBUMIN 3.2*   No results for input(s): LIPASE, AMYLASE in the last 168 hours. No results for input(s): AMMONIA in the last 168 hours. Coagulation Profile: No results for input(s): INR, PROTIME in the last 168 hours. Cardiac Enzymes: No results for input(s): CKTOTAL, CKMB, CKMBINDEX, TROPONINI in the last 168 hours. BNP (last 3 results) No results for input(s): PROBNP in the last 8760 hours. HbA1C: No results for input(s): HGBA1C in the last 72 hours. CBG: No results for input(s): GLUCAP in the last 168 hours. Lipid Profile: No results for input(s): CHOL, HDL, LDLCALC, TRIG, CHOLHDL, LDLDIRECT in the last 72 hours. Thyroid Function Tests: No results for input(s): TSH, T4TOTAL, FREET4, T3FREE, THYROIDAB in the last 72 hours. Anemia Panel: No results for input(s): VITAMINB12, FOLATE, FERRITIN, TIBC,  IRON, RETICCTPCT in the last 72 hours. Sepsis Labs: No results for input(s): PROCALCITON, LATICACIDVEN in the last 168 hours.  Recent Results (from the past 240 hour(s))  MRSA PCR Screening     Status: None   Collection Time: 06/17/19  6:22 PM   Specimen: Nasal Mucosa; Nasopharyngeal  Result Value Ref Range Status   MRSA by PCR NEGATIVE NEGATIVE Final    Comment:        The GeneXpert MRSA Assay (FDA approved for NASAL specimens only), is one component of a comprehensive MRSA colonization surveillance program. It is not intended to diagnose MRSA infection nor to guide or monitor treatment for MRSA infections. Performed at [facility], [address] [place], [place] [address]   Respiratory Panel by RT PCR (Flu A&B, Covid) - Nasopharyngeal Swab     Status: None   Collection Time: 06/17/19  6:25 PM   Specimen: Nasopharyngeal Swab  Result Value Ref Range Status   SARS Coronavirus 2 by RT PCR NEGATIVE NEGATIVE Final    Comment: (NOTE) SARS-CoV-2 target nucleic acids are NOT DETECTED.  The SARS-CoV-2 RNA is generally detectable in upper respiratoy specimens during the acute phase of infection. The lowest concentration of SARS-CoV-2 viral copies this assay can detect is 131 copies/mL. A negative result does not preclude SARS-Cov-2 infection and should not be used as the sole basis for treatment or other patient management decisions. A negative result may occur with  improper specimen collection/handling, submission of specimen other than nasopharyngeal swab, presence of viral mutation(s) within the areas targeted by this assay, and inadequate number of viral copies (<131 copies/mL). A negative result must be combined with clinical observations, patient history, and epidemiological information. The expected result is Negative. Fact Sheet for Patients:  PinkCheek.be Fact Sheet for Healthcare Providers:  GravelBags.it This  test is not yet ap proved or cleared by the [place] FDA and  has been authorized for detection and/or diagnosis of SARS-CoV-2 by FDA under an Emergency Use Authorization (EUA). This EUA will remain  in effect (meaning this test can be used) for the duration of the COVID-19 declaration under Section 564(b)(1) of the Act, 21 U.S.C. section 360bbb-3(b)(1), unless the authorization is terminated or revoked sooner.    Influenza A by PCR NEGATIVE NEGATIVE Final   Influenza B by PCR NEGATIVE NEGATIVE Final    Comment: (NOTE) The Xpert Xpress SARS-CoV-2/FLU/RSV assay is intended as an aid in  the diagnosis of influenza from Nasopharyngeal swab specimens and  should not be used as a sole basis for treatment. Nasal washings and  aspirates are unacceptable for Xpert Xpress SARS-CoV-2/FLU/RSV  testing. Fact Sheet for Patients: PinkCheek.be Fact Sheet for Healthcare Providers: GravelBags.it This test is not yet approved or cleared by the [place] FDA and  has been authorized for detection and/or diagnosis of SARS-CoV-2 by  FDA under an Emergency Use Authorization (EUA). This EUA will remain  in effect (meaning this test can be used) for the duration of the  Covid-19 declaration under Section 564(b)(1) of the Act, 21  U.S.C. section 360bbb-3(b)(1), unless the authorization is  terminated or revoked. Performed at [facility], [address], [place], [place] [address]          Radiology Studies: No results found.      Scheduled Meds: . amiodarone  200 mg Oral Daily  . aspirin EC  81 mg Oral Daily  . carvedilol  12.5 mg Oral BID WC  . Chlorhexidine Gluconate Cloth  6 each Topical Daily  . Chlorhexidine Gluconate Cloth  6 each Topical Q0600  . cloNIDine  0.1 mg Oral BID  . doxercalciferol  2.5 mcg Intravenous Q M,W,F-HD  . felodipine  2.5 mg Oral Daily  . gabapentin  100 mg Oral QHS  . heparin injection  (subcutaneous)  5,000 Units Subcutaneous Q8H  . multivitamin  1 tablet Oral Daily  . pantoprazole  40 mg Oral Daily  . rosuvastatin  10 mg Oral QPM  . sevelamer carbonate  2,400 mg Oral TID WC   Continuous Infusions: . sodium chloride    . sodium chloride       LOS: 2 days     Enzo Bi, MD [facility] Pager [phone]  If 7PM-7AM, please contact night-coverage [email or web address] Password [email or web address] 06/19/2019, 11:32 PM

## 2019-06-20 MED ORDER — DARBEPOETIN ALFA 60 MCG/0.3ML IJ SOSY
60.0000 ug | PREFILLED_SYRINGE | INTRAMUSCULAR | Status: DC
  Administered 2019-06-20: 60 ug via INTRAVENOUS
  Filled 2019-06-20: qty 0.3

## 2019-06-20 MED ORDER — PENTAFLUOROPROP-TETRAFLUOROETH EX AERO: 1.0000 "application " | INHALATION_SPRAY | CUTANEOUS | Status: DC | PRN

## 2019-06-20 MED ORDER — SODIUM CHLORIDE 0.9 % IV SOLN: 100.0000 mL | INTRAVENOUS | Status: DC | PRN

## 2019-06-20 MED ORDER — CHLORHEXIDINE GLUCONATE CLOTH 2 % EX PADS: 6.0000 | MEDICATED_PAD | Freq: Every day | CUTANEOUS | Status: DC

## 2019-06-20 MED ORDER — LIDOCAINE HCL (PF) 1 % IJ SOLN: 5.0000 mL | INTRAMUSCULAR | Status: DC | PRN

## 2019-06-20 MED ORDER — LIDOCAINE-PRILOCAINE 2.5-2.5 % EX CREA: 1.0000 "application " | TOPICAL_CREAM | CUTANEOUS | Status: DC | PRN

## 2019-06-20 MED ORDER — HEPARIN SODIUM (PORCINE) 1000 UNIT/ML DIALYSIS: 1000.0000 [IU] | INTRAMUSCULAR | Status: DC | PRN

## 2019-06-20 MED ORDER — POLYETHYLENE GLYCOL 3350 17 G PO PACK: 17.0000 g | PACK | Freq: Every day | ORAL | 0 refills | Status: DC | PRN

## 2019-06-20 NOTE — Progress Notes (Signed)
Home Health Choices:    [facility] [phone] Quality rating Patient survey rating Not available11 2. [facility] [facility] [phone] Quality rating Patient survey rating 3. [facility] [phone] Quality rating Patient survey rating 4. [facility] [phone] Quality rating Patient survey rating 5. [place] [phone] Quality rating Patient survey rating 6. [place] [phone] Quality rating Patient survey rating 7. [facility] [phone] Quality rating Patient survey rating 11 8. [facility] [phone] Quality rating Patient survey rating 11 9. [facility] [phone] Quality rating Patient survey rating 10. [name] [phone] Quality rating Patient survey rating 11. [place] [phone] Quality rating Patient survey rating 12. [facility] [phone] Quality rating Patient survey rating Not available12

## 2019-06-20 NOTE — Progress Notes (Signed)
Reviewed d/c instructions with pt at this time. Pt states understanding of medication changes, follow up, and d/c instructions. Resume normal dialysis schedule tomorrow. IV removed. Wallet from security returned by [employer]. All pt belongs accounted for.

## 2019-06-20 NOTE — Progress Notes (Signed)
  [facility] Progress Note   Assessment/ Plan:   Dialysis Orders:  MWF [facility]  Duration 3.5 hours  BF 300  DF 600  Dialyzer - nipro cellent dialyzer--> tolerates F180 here EDW 56.5 kg Bath 2 K/2.5 ca ESA - epogen 8000 units three times a week; venofer 50 mg weekly   hectorol 2.5 mcg three times a week  No heparin   Assessment/Plan:  # ESRD: Normally MWF. off schedule yesterday 12/8.  Off schedule again today 12/10.     # HTN/ volume--> with hypertensive emergency on presentation. off nitro gtt, on PO meds with good control, probe for EDW--> pt thinks she's lost some weight.    # Chest pain with hx CAD: improved with BP control and HD, trops down, likely demand ischemia   # Anemia of chronic disease: Will dose Aranesp 60 mcg with HD today  # Secondary hyperparathyroidism - resume hectorol. on sevelamer; on renal diet  #Dispo: OK from renal perspective to d/c--> pt feels weak so PT has been consulted.   Subjective:    BP OK, pt reports some SOB and chest tightness walking around unit this AM.  For HD today.  Reports she feels weak/ deconditioned.      Objective:   BP (!) 125/39   Pulse (!) 52   Temp 98.7 F (37.1 C)   Resp 16   Ht 5\' 1"  (1.549 m)   Wt 58.9 kg   SpO2 100%   BMI 24.54 kg/m   Physical Exam: Gen: NAD, sitting up in chair CVS: well-healed sternotomy scar, RRR with crisp S2 Resp: normal WOB, clear anteriorly, bibasilar crackles Abd: benign Ext: no LE edema ACCESS: + RUE AVG + T/B  Labs: BMET Recent Labs  Lab 06/17/19 1429 06/18/19 0427 06/19/19 0427  NA 139 137 132*  K 4.2 4.9 3.5  CL 94* 100 92*  CO2 27 19* 27  GLUCOSE 84 79 97  BUN 56* 64* 35*  CREATININE 10.49* 11.94* 7.01*  CALCIUM 10.0 9.4 8.8*  PHOS  --   --  2.9   CBC Recent Labs  Lab 06/17/19 1429 06/18/19 0852 06/19/19 0427  WBC 6.2 5.0 4.4  HGB 10.3* 9.2* 9.0*  HCT 33.7* 29.6* 28.9*  MCV 113.9* 114.3* 114.2*  PLT 232 200 194     @IMGRELPRIORS @ Medications:    . amiodarone  200 mg Oral Daily  . aspirin EC  81 mg Oral Daily  . carvedilol  12.5 mg Oral BID WC  . Chlorhexidine Gluconate Cloth  6 each Topical Daily  . Chlorhexidine Gluconate Cloth  6 each Topical Q0600  . Chlorhexidine Gluconate Cloth  6 each Topical Q0600  . cloNIDine  0.1 mg Oral BID  . doxercalciferol  2.5 mcg Intravenous Q M,W,F-HD  . felodipine  2.5 mg Oral Daily  . gabapentin  100 mg Oral QHS  . heparin injection (subcutaneous)  5,000 Units Subcutaneous Q8H  . multivitamin  1 tablet Oral Daily  . pantoprazole  40 mg Oral Daily  . rosuvastatin  10 mg Oral QPM  . sevelamer carbonate  2,400 mg Oral TID WC     [name], MD 06/20/2019, 9:56 AM

## 2019-06-20 NOTE — TOC Transition Note (Signed)
Transition of Care [facility]) - CM/SW Discharge Note   Patient Details  Name: Shawna [name] MRN: [id number] Date of Birth: 05/09/1941  Transition of Care [name]) CM/SW Contact:  Boneta Lucks, RN Phone Number: 06/20/2019, 10:55 AM   Clinical Narrative:   Patient discharging home today after dialysis. PT recommending Home health PT. Patient states she has used [facility] in the past.  CM called [facility] they will accept the referral, orders faxed to their office [phone].      Barriers to Discharge: Barriers Resolved   Patient Goals and CMS Choice   CMS Medicare.gov Compare Post Acute Care list provided to:: Patient Choice offered to / list presented to : Patient  Expected Discharge Plan and Services Expected Discharge Plan: [place] In-house Referral: Clinical Social Work     Living arrangements for the past 2 months: [place]: PT [facility] Agency: [facility] Date [place]: 06/20/19 Time [place]: 56 Representative spoke with at [place]: Called and faxed orders to [phone]  Prior Living Arrangements/Services Living arrangements for the past 2 months: [place] Lives with:: Spouse Patient language and need for interpreter reviewed:: Yes Do you feel safe going back to the place where you live?: Yes      Need for Family Participation in Patient Care: Yes (Comment) Care giver support system in place?: Yes (comment) Current home services: DME Criminal Activity/Legal Involvement Pertinent to Current Situation/Hospitalization: No - Comment as needed  Activities of Daily Living Home Assistive Devices/Equipment: Cane (specify quad or straight), Eyeglasses ADL Screening (condition at time of admission) Patient's cognitive ability adequate to safely complete daily activities?: Yes Is the patient deaf or have difficulty hearing?: No Does the patient have difficulty seeing, even  when wearing glasses/contacts?: No Does the patient have difficulty concentrating, remembering, or making decisions?: No Patient able to express need for assistance with ADLs?: Yes Does the patient have difficulty dressing or bathing?: No Independently performs ADLs?: Yes (appropriate for developmental age) Does the patient have difficulty walking or climbing stairs?: No Weakness of Legs: None Weakness of Arms/Hands: None  Permission Sought/Granted    Emotional Assessment Appearance:: Appears stated age Attitude/Demeanor/Rapport: Engaged Affect (typically observed): Pleasant Orientation: : Oriented to Self, Oriented to Place, Oriented to  Time, Oriented to Situation Alcohol / Substance Use: Not Applicable Psych Involvement: No (comment)  Admission diagnosis:  Dysphagia [R13.10] Dyspnea, unspecified type [R06.00] Chest pain, unspecified type [R07.9] Patient Active Problem List   Diagnosis Date Noted  . Angina pectoris ([place]) 06/05/2019  . Chest pain 06/03/2019  . Rectal bleeding 05/14/2019  . Small intestinal bacterial overgrowth 05/14/2019  . Iron deficiency anemia 04/02/2019  . GI bleed 03/08/2019  . Gastrointestinal hemorrhage with melena 03/06/2019  . Acute respiratory failure with hypoxia ([place]) 12/25/2018  . Elevated troponin 12/14/2018  . Chest pain at rest 07/13/2018  . Hand steal syndrome ([place]) 08/01/2017  . Anemia 07/14/2017  . Coronary artery disease of bypass graft of native heart with stable angina pectoris ([place]) 06/05/2017  . Mesenteric ischemia ([place])   . Diverticulitis   . SBO (small bowel obstruction) ([place]) 03/22/2017  . Enteritis   . Complication of vascular access for dialysis 03/19/2017  . Preoperative clearance 01/25/2017  . Symptomatic anemia 10/24/2016  . H/O non-ST elevation myocardial infarction (NSTEMI) 10/24/2016  . Fluid overload 10/10/2016  . Complication  from renal dialysis device 10/10/2016  . Non-ST elevation MI (NSTEMI) ([place])   . Encounter  for fitting and adjustment of vascular catheter   . Heme positive stool   . Demand ischemia ([place]) 07/27/2016  . Hypertensive emergency 07/08/2016  . Acute on chronic respiratory failure with hypoxia ([place])   . Cardiac arrest ([place])   . Palliative care encounter   . Goals of care, counseling/discussion   . Hypertensive crisis without congestive heart failure 05/09/2016  . Acute pulmonary edema ([place]) 04/06/2016  . Acute respiratory failure ([place]) 04/06/2016  . Hypertensive crisis 01/27/2016  . History of colon cancer 01/27/2016  . History of ovarian cancer 01/27/2016  . Hypertensive urgency 01/27/2016  . AF (paroxysmal atrial fibrillation) ([place]) 10/14/2015  . Coronary angioplasty status 10/14/2015  . Malignant neoplasm of right ovary ([place]) 10/14/2015  . Narrow complex tachycardia ([place]) 09/08/2015  . SVT (supraventricular tachycardia) ([place]) 09/08/2015  . Influenza A 08/30/2015  . Acute on chronic diastolic CHF (congestive heart failure) ([place]) 05/04/2015  . Unstable angina ([place]) 05/03/2015  . Essential hypertension   . Pain in joint, lower leg 08/14/2014  . Dacryocystitis 05/29/2013  . Chronic diastolic CHF (congestive heart failure) ([place]) 03/22/2013  . GI bleeding 03/21/2013  . Acute blood loss anemia 03/21/2013  . Vaginal odor 03/12/2013  . Vaginal discharge 03/12/2013  . Occlusion and stenosis of carotid artery without mention of cerebral infarction 01/24/2013  . Hx of CABG 07/05/2012  . Carotid artery disease ([name]) 07/05/2012  . Mitral regurgitation 06/12/2012  . Pneumonia 06/09/2012  . Non-STEMI (non-ST elevated myocardial infarction) ([place]) 06/08/2012  . Ischemic chest pain ([place]) 03/01/2012  . 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 GERD (gastroesophageal reflux disease) 01/09/2012  . HLD (hyperlipidemia) 01/05/2012  . Atherosclerotic heart disease of native coronary artery without angina pectoris 12/16/2011  . ESRD on hemodialysis ([place])  12/16/2011  . Anxiety disorder 05/04/2011  . Anemia in chronic kidney disease 04/29/2011  . Secondary hyperparathyroidism of renal origin ([place]) 04/29/2011  . End stage renal disease ([place]) 04/29/2011  . Gout 04/29/2011  . Hypertensive chronic kidney disease with stage 5 chronic kidney disease or end stage renal disease ([place]) 04/29/2011   PCP:  [name], MD Pharmacy:   [facility] [address] [place] [address] Phone: [phone] Fax: [phone]  [facility] (ESRD Bundle Only) - [place], [place] [address] [place] [address] Phone: [phone] Fax: [place], [place] [address] [place] [address] Phone: [phone] Fax: [phone]    Readmission Risk Interventions Readmission Risk Prevention Plan 06/18/2019 12/27/2018  Transportation Screening Complete Complete  PCP or Specialist Appt within 3-5 Days - Complete  HRI or [place] - Complete  Social Work Consult for [place] Planning/Counseling - Complete  Palliative Care Screening - Not Applicable  Medication Review Press photographer) Complete Complete  SW Recovery Care/Counseling Consult Complete -  Palliative Care Screening Not Applicable -  [place] Not Applicable -  Some recent data might be hidden

## 2019-06-20 NOTE — Evaluation (Signed)
Physical Therapy Evaluation Patient Details Name: Shawna [name] MRN: [id number] DOB: [birth year] Today's Date: 06/20/2019   History of Present Illness  Shawna [name] is a 78 y.o. female with medical history significant for ESRD, CABG 1749, diastolic CHF, hypertension, cardiac arrest, atrial fibrillation, GI bleed- small bowel AVMs.  Patient presented to the ED with complaints of 2 episodes of chest pain that started early this morning when she was about to go to bed sleep- at about 2 AM and then again at about 10 AM when she was about to eat.  Chest pain improved with nitro both times.  She reports intermittent chest pain lasting a 50 minutes over the past week.  She describes pain as pressure-like and "hurting".  Chest pain is nonradiating.  She reports compliance with her medications HD sessions.Patient was supposed to get dialyzed today, her schedule is Monday Wednesday Friday.  Last HD was on Friday.    Clinical Impression  Patient very unsteady on feet when attempting ambulation without AD with frequent buckling of knees, required use of RW for safety and tolerated walking in hall with slow labored movement without loss of balance, limited secondary to fatigue and tolerated sitting up in chair after therapy - RN notified.  Patient will benefit from continued physical therapy in hospital and recommended venue below to increase strength, balance, endurance for safe ADLs and gait.    Follow Up Recommendations Home health PT;Supervision for mobility/OOB;Supervision - Intermittent    Equipment Recommendations  None recommended by PT    Recommendations for Other Services       Precautions / Restrictions Precautions Precautions: Fall Restrictions Weight Bearing Restrictions: No      Mobility  Bed Mobility Overal bed mobility: Needs Assistance Bed Mobility: Supine to Sit     Supine to sit: Min guard;Min assist     General bed mobility comments: slow labored  movement  Transfers Overall transfer level: Needs assistance Equipment used: Rolling walker (2 wheeled);None;1 person hand held assist Transfers: Sit to/from [facility] Sit to Stand: Min guard;Min assist Stand pivot transfers: Min guard;Min assist       General transfer comment: very unsteady on feet without AD, required use of RW for safety  Ambulation/Gait Ambulation/Gait assistance: Min guard Gait Distance (Feet): 30 Feet Assistive device: Rolling walker (2 wheeled) Gait Pattern/deviations: Decreased step length - right;Decreased step length - left;Decreased stride length Gait velocity: decreased   General Gait Details: slow labored cadence without loss of balance, limited mostly due to c/o fatigue  Stairs            Wheelchair Mobility    Modified Rankin (Stroke Patients Only)       Balance Overall balance assessment: Needs assistance Sitting-balance support: Feet supported;No upper extremity supported Sitting balance-Leahy Scale: Good Sitting balance - Comments: seated at EOB   Standing balance support: During functional activity;No upper extremity supported Standing balance-Leahy Scale: Poor Standing balance comment: poor with hand held assist, fair using RW                             Pertinent Vitals/Pain Pain Assessment: No/denies pain    Home Living Family/patient expects to be discharged to:: Private residence Living Arrangements: Spouse/significant other Available Help at Discharge: Family;Available 24 hours/day Type of Home: House Home Access: Stairs to enter Entrance Stairs-Rails: Right Entrance Stairs-Number of Steps: 1 Home Layout: One level Home Equipment: Walker - 4 wheels;Cane - single point;Shower seat  Prior Function Level of Independence: Independent with assistive device(s)         Comments: household ambulator without AD most of time, for longer distances uses SPC PRN     Hand Dominance    Dominant Hand: Right    Extremity/Trunk Assessment   Upper Extremity Assessment Upper Extremity Assessment: Generalized weakness    Lower Extremity Assessment Lower Extremity Assessment: Generalized weakness    Cervical / Trunk Assessment Cervical / Trunk Assessment: Kyphotic  Communication   Communication: No difficulties  Cognition Arousal/Alertness: Awake/alert Behavior During Therapy: WFL for tasks assessed/performed Overall Cognitive Status: Within Functional Limits for tasks assessed                                        General Comments      Exercises     Assessment/Plan    PT Assessment Patient needs continued PT services  PT Problem List Decreased strength;Decreased activity tolerance;Decreased balance;Decreased mobility       PT Treatment Interventions Balance training;Gait training;Stair training;Functional mobility training;Therapeutic activities;Therapeutic exercise;Patient/family education    PT Goals (Current goals can be found in the Care Plan section)  Acute Rehab PT Goals Patient Stated Goal: return home with family to assist PT Goal Formulation: With patient Time For Goal Achievement: 06/27/19 Potential to Achieve Goals: Good    Frequency Min 3X/week   Barriers to discharge        Co-evaluation               AM-PAC PT "6 Clicks" Mobility  Outcome Measure Help needed turning from your back to your side while in a flat bed without using bedrails?: None Help needed moving from lying on your back to sitting on the side of a flat bed without using bedrails?: A Little Help needed moving to and from a bed to a chair (including a wheelchair)?: A Little Help needed standing up from a chair using your arms (e.g., wheelchair or bedside chair)?: A Little Help needed to walk in hospital room?: A Little Help needed climbing 3-5 steps with a railing? : A Lot 6 Click Score: 18    End of Session   Activity Tolerance: Patient  tolerated treatment well;Patient limited by fatigue Patient left: in chair;with call bell/phone within reach Nurse Communication: Mobility status PT Visit Diagnosis: Unsteadiness on feet (R26.81);Other abnormalities of gait and mobility (R26.89);Muscle weakness (generalized) (M62.81)    Time: 6720-9470 PT Time Calculation (min) (ACUTE ONLY): 26 min   Charges:   PT Evaluation $PT Eval Moderate Complexity: 1 Mod PT Treatments $Therapeutic Activity: 23-37 mins        2:05 PM, 06/20/19 Lonell Grandchild, MPT Physical Therapist with [facility] [phone] office [phone] mobile phone

## 2019-06-20 NOTE — Discharge Summary (Signed)
Physician Discharge Summary   Shawna [name]  female DOB: [birth year]  NIO:[id number]  PCP: [name], MD  Admit date: 06/17/2019 Discharge date: 06/20/2019  Admitted From: home Disposition:  home CODE STATUS: Full code  Discharge Instructions    Diet renal 60/70-08-12-1198   Complete by: As directed       Hospital Course:  For full details, please see H&P, progress notes, consult notes and ancillary notes.  Briefly,  Shawna K Moyeris a 78 y.o.femalewith medical history significant forESRD, CABG 8182, diastolic CHF, hypertension, cardiac arrest, atrial fibrillation, GI bleed-small bowel AVMs.Patient presented to the ED with complaints of 2 episodes of chest pain.  # Chest pain secondary to hypertensive urgency-resolved # History of medication noncompliance On presentation, blood pressure systolic went up to 993/71, tachycardic to 120, O2 sats 100% on room air. troponin 40 > 58.  Initial EKG sinus tachycardia, without significant change.  Patient was started on nitro drip with control of BP and no further chest pain by next day.  Pt discussed with cardiology who recommended BP control and outpatient followup.  Patient had poor compliance with home clonidine and stated that her blood pressures seems to get too low and she would hold clonidine on occasion.  Non compliance with her clonidine may be causing rebound hypertension, and this was discussed with the pt.  After pt was weaned off nitro gtt, she was continued on her home BP regimen of coreg, clonidine and felodipine.  Per cards, pt was taken off Imdur due to intolerance.    # Elevated troponin secondary to demand ischemia from above # Hx of CAD with prior history of CABG in 2013 Discussed with Dr. Bronson Ing who recommended no further evaluation at this time and will see pt as outpatient.  Continued home aspirin and statin.  ESRD on HD MWF Pt received iHD off scheduled because she had missed a session prior to  presentation.  Pt will resume regular dialysis schedule after discharge.  History of paroxysmal atrial fibrillation not on anticoagulation HR had been sinus and in 50's-60's.  I discussed with Dr. Bronson Ing who was ok with pt's HR and recommended continuing pt's home rate control regimen of amiodarone, coreg and Felodipine.  No systemic anticoagulation due to history of GI bleed.  Diastolic CHF-stable Volume status per nephrology  History of GI bleed Noted prior small bowel AVMs.  Continued aspirin.   Discharge Diagnoses:  Active Problems:   Hypertensive urgency   Discharge Instructions:  Allergies as of 06/20/2019      Reactions   Aspirin Other (See Comments)   High Doses Mess up her stomach; "makes my bowels have blood in them". Takes 81 mg EC Aspirin    Penicillins Other (See Comments)   SYNCOPE? , "makes me real weak when I take it; like I'll pass out" Has patient had a PCN reaction causing immediate rash, facial/tongue/throat swelling, SOB or lightheadedness with hypotension: Yes Has patient had a PCN reaction causing severe rash involving mucus membranes or skin necrosis: no Has patient had a PCN reaction that required hospitalization no Has patient had a PCN reaction occurring within the last 10 years: no If all of the above   Amlodipine Swelling   Bactrim [sulfamethoxazole-trimethoprim] Rash   Contrast Media [iodinated Diagnostic Agents] Itching   Iron Itching, Other (See Comments)   "they gave me iron in dialysis; had to give me Benadryl cause I had to have the iron" (05/02/2012)   Nitrofurantoin Hives   Tylenol [acetaminophen] Itching,  Other (See Comments)   Makes her feet on fire per pt   Gabapentin Other (See Comments)   Unknown reaction   Hydralazine Itching   Dexilant [dexlansoprazole] Other (See Comments)   Upset stomach   Levaquin [levofloxacin In D5w] Rash   Morphine And Related Itching, Other (See Comments)   Itching in feet   Plavix [clopidogrel  Bisulfate] Rash   Protonix [pantoprazole Sodium] Rash   Venofer [ferric Oxide] Itching, Other (See Comments)   Patient reports using Benadryl prior to doses as [place]      Medication List    STOP taking these medications   hyoscyamine 0.125 MG SL tablet Commonly known as: LEVSIN SL   isosorbide mononitrate 60 MG 24 hr tablet Commonly known as: IMDUR     TAKE these medications   ALPRAZolam 0.5 MG tablet Commonly known as: XANAX Take 0.5 mg by mouth daily as needed for anxiety.   amiodarone 200 MG tablet Commonly known as: PACERONE Take 1 tablet (200 mg total) by mouth daily.   aspirin EC 81 MG tablet Take 1 tablet (81 mg total) by mouth daily. Restart on 03/13/19   benzonatate 100 MG capsule Commonly known as: TESSALON Take 100 mg by mouth 3 (three) times daily as needed for cough.   carvedilol 12.5 MG tablet Commonly known as: COREG Take 1 tablet (12.5 mg total) by mouth 2 (two) times daily with a meal. Additional refills per PCP What changed:   when to take this  reasons to take this  additional instructions   cloNIDine 0.1 MG tablet Commonly known as: Catapres Take 1 tablet (0.1 mg total) by mouth 2 (two) times daily. What changed:   when to take this  reasons to take this   felodipine 2.5 MG 24 hr tablet Commonly known as: PLENDIL Take 1 tablet (2.5 mg total) by mouth daily. What changed:   when to take this  reasons to take this   fluticasone 50 MCG/ACT nasal spray Commonly known as: FLONASE Place 1 spray at bedtime as needed into both nostrils for allergies.   gabapentin 100 MG capsule Commonly known as: NEURONTIN Take 100 mg by mouth at bedtime.   lidocaine-prilocaine cream Commonly known as: EMLA Apply 1 application topically every Monday, Wednesday, and Friday. Prior to dialysis   loratadine 10 MG tablet Commonly known as: CLARITIN Take 1 tablet (10 mg total) by mouth daily as needed for allergies.   multivitamin Tabs  tablet Take 1 tablet by mouth daily.   nitroGLYCERIN 0.4 MG SL tablet Commonly known as: NITROSTAT DISSOLVE ONE TABLET UNDER THE TONGUE EVERY 5 MINUTES AS NEEDED FOR CHEST PAIN.  DO NOT EXCEED A TOTAL OF 3 DOSES IN 15 MINUTES What changed: See the new instructions.   nystatin ointment Commonly known as: MYCOSTATIN APPLY A SMALL AMOUNT TOPICALLY TWICE DAILY FOR 7 DAYS TO EXTERNAL ANAL AREA FOR ANAL ITCHINESS   omeprazole 20 MG capsule Commonly known as: PRILOSEC Take 2 capsules (40 mg total) by mouth 2 (two) times daily before a meal. What changed: how much to take   ondansetron 4 MG disintegrating tablet Commonly known as: ZOFRAN-ODT Take 4 mg by mouth every 8 (eight) hours as needed for nausea or vomiting.   polyethylene glycol 17 g packet Commonly known as: MIRALAX / GLYCOLAX Take 17 g by mouth daily as needed for mild constipation.   rosuvastatin 10 MG tablet Commonly known as: CRESTOR Take 1 tablet (10 mg total) by mouth daily. What changed: when  to take this   sevelamer carbonate 800 MG tablet Commonly known as: RENVELA Take 1,600-2,400 mg by mouth See admin instructions. Take three tablets with meals and two tablets with snacks       Follow-up Information    [facility] Follow up.   Why:  PT -  They will call you to set up and appointment Contact information: [place], [place] [phone]           Allergies  Allergen Reactions  . Aspirin Other (See Comments)    High Doses Mess up her stomach; "makes my bowels have blood in them". Takes 81 mg EC Aspirin   . Penicillins Other (See Comments)    SYNCOPE? , "makes me real weak when I take it; like I'll pass out"  Has patient had a PCN reaction causing immediate rash, facial/tongue/throat swelling, SOB or lightheadedness with hypotension: Yes Has patient had a PCN reaction causing severe rash involving mucus membranes or skin necrosis: no Has patient had a PCN reaction that required hospitalization  no Has patient had a PCN reaction occurring within the last 10 years: no If all of the above  . Amlodipine Swelling  . Bactrim [Sulfamethoxazole-Trimethoprim] Rash  . Contrast Media [Iodinated Diagnostic Agents] Itching  . Iron Itching and Other (See Comments)    "they gave me iron in dialysis; had to give me Benadryl cause I had to have the iron" (05/02/2012)  . Nitrofurantoin Hives  . Tylenol [Acetaminophen] Itching and Other (See Comments)    Makes her feet on fire per pt  . Gabapentin Other (See Comments)    Unknown reaction  . Hydralazine Itching  . Dexilant [Dexlansoprazole] Other (See Comments)    Upset stomach  . Levaquin [Levofloxacin In D5w] Rash  . Morphine And Related Itching and Other (See Comments)    Itching in feet  . Plavix [Clopidogrel Bisulfate] Rash  . Protonix [Pantoprazole Sodium] Rash  . Venofer [Ferric Oxide] Itching and Other (See Comments)    Patient reports using Benadryl prior to doses as [facility]     The results of significant diagnostics from this hospitalization (including imaging, microbiology, ancillary and laboratory) are listed below for reference.   Consultations:   Procedures/Studies: DG Chest 2 View  Result Date: 06/17/2019 CLINICAL DATA:  Chest pain beginning today. EXAM: CHEST - 2 VIEW COMPARISON:  06/03/2019 FINDINGS: Previous median sternotomy and CABG. Chronic cardiomegaly and aortic atherosclerosis. Mild chronic scarring at both lung bases. Possible venous hypertension but no frank edema. No consolidation, collapse or measurable effusion. No acute bone finding. IMPRESSION: Previous CABG. Chronic cardiomegaly. Possible venous hypertension. No frank edema. Electronically Signed   By: Nelson Chimes M.D.   On: 06/17/2019 13:23   DG Chest 2 View  Result Date: 06/03/2019 CLINICAL DATA:  Chest pain EXAM: CHEST - 2 VIEW COMPARISON:  March 08, 2019 FINDINGS: Linear scarring or atelectasis at the right lung base. Chronic blunting of the  left costophrenic angle. Mild pulmonary vascular congestion. No new consolidation or edema. Stable cardiomediastinal contours with mild cardiomegaly. IMPRESSION: Mild pulmonary vascular congestion. Electronically Signed   By: Macy Mis M.D.   On: 06/03/2019 16:05   DG CHEST PORT 1 VIEW  Result Date: 06/17/2019 CLINICAL DATA:  Shortness of breath and weakness EXAM: PORTABLE CHEST 1 VIEW COMPARISON:  Film from earlier in the same day. FINDINGS: Cardiac shadow is stable. Post surgical changes are again seen. Increasing patchy infiltrate is noted within the mid right lung when compare with the  prior exam likely related to developing edema. No bony abnormality is noted. IMPRESSION: 1. Increasing vascular congestion with changes suggestive of asymmetric edema on the right. Electronically Signed   By: Inez Catalina M.D.   On: 06/17/2019 18:14      Labs: BNP (last 3 results) No results for input(s): BNP in the last 8760 hours. Basic Metabolic Panel: No results for input(s): NA, K, CL, CO2, GLUCOSE, BUN, CREATININE, CALCIUM, MG, PHOS in the last 168 hours. Liver Function Tests: No results for input(s): AST, ALT, ALKPHOS, BILITOT, PROT, ALBUMIN in the last 168 hours. No results for input(s): LIPASE, AMYLASE in the last 168 hours. No results for input(s): AMMONIA in the last 168 hours. CBC: No results for input(s): WBC, NEUTROABS, HGB, HCT, MCV, PLT in the last 168 hours. Cardiac Enzymes: No results for input(s): CKTOTAL, CKMB, CKMBINDEX, TROPONINI in the last 168 hours. BNP: Invalid input(s): POCBNP CBG: No results for input(s): GLUCAP in the last 168 hours. D-Dimer No results for input(s): DDIMER in the last 72 hours. Hgb A1c No results for input(s): HGBA1C in the last 72 hours. Lipid Profile No results for input(s): CHOL, HDL, LDLCALC, TRIG, CHOLHDL, LDLDIRECT in the last 72 hours. Thyroid function studies No results for input(s): TSH, T4TOTAL, T3FREE, THYROIDAB in the last 72  hours.  Invalid input(s): FREET3 Anemia work up No results for input(s): VITAMINB12, FOLATE, FERRITIN, TIBC, IRON, RETICCTPCT in the last 72 hours. Urinalysis    Component Value Date/Time   COLORURINE YELLOW 12/16/2012 1919   APPEARANCEUR CLOUDY (A) 12/16/2012 1919   LABSPEC 1.009 12/16/2012 1919   PHURINE 7.5 12/16/2012 1919   GLUCOSEU NEGATIVE 12/16/2012 1919   HGBUR TRACE (A) 12/16/2012 1919   BILIRUBINUR NEGATIVE 12/16/2012 1919   KETONESUR NEGATIVE 12/16/2012 1919   PROTEINUR 100 (A) 12/16/2012 1919   UROBILINOGEN 0.2 12/16/2012 1919   NITRITE NEGATIVE 12/16/2012 1919   LEUKOCYTESUR SMALL (A) 12/16/2012 1919   Sepsis Labs Invalid input(s): PROCALCITONIN,  WBC,  LACTICIDVEN Microbiology No results found for this or any previous visit (from the past 240 hour(s)).   Total time spend on discharging this patient, including the last patient exam, discussing the hospital stay, instructions for ongoing care as it relates to all pertinent caregivers, as well as preparing the medical discharge records, prescriptions, and/or referrals as applicable, is 30 minutes.    Enzo Bi, MD  [facility] 06/28/2019, 2:24 AM  If 7PM-7AM, please contact night-coverage

## 2019-06-20 NOTE — Plan of Care (Signed)
  Problem: Acute Rehab PT Goals(only PT should resolve) Goal: Pt Will Go Supine/Side To Sit Outcome: Progressing Flowsheets (Taken 06/20/2019 1406) Pt will go Supine/Side to Sit: with modified independence Goal: Patient Will Transfer Sit To/From Stand Outcome: Progressing Flowsheets (Taken 06/20/2019 1406) Patient will transfer sit to/from stand:  with supervision  with min guard assist Goal: Pt Will Transfer Bed To Chair/Chair To Bed Outcome: Progressing Flowsheets (Taken 06/20/2019 1406) Pt will Transfer Bed to Chair/Chair to Bed:  with supervision  min guard assist Goal: Pt Will Ambulate Outcome: Progressing Flowsheets (Taken 06/20/2019 1406) Pt will Ambulate:  75 feet  with min guard assist  with supervision  with rolling walker   2:07 PM, 06/20/19 Lonell Grandchild, MPT Physical Therapist with [facility] [phone] office [phone] mobile phone

## 2019-06-21 ENCOUNTER — Telehealth: Payer: Self-pay | Admitting: *Deleted

## 2019-06-21 NOTE — Telephone Encounter (Signed)
I will let Dr Raliegh Ip decide when he returns on Monday, please make sure message forwared to him   Zandra Abts MD

## 2019-06-21 NOTE — Telephone Encounter (Signed)
Pt was recently at [facility] ED for chest pain and says she was told to f/u with Dr Bronson Ing - pt aware that would sent MD message on when pt needs to f/u

## 2019-06-24 NOTE — Telephone Encounter (Signed)
I was in communication with the hospitalist while the patient was hospitalized.  Symptoms were likely due to uncontrolled blood pressure.  She should continue to take the medications as prescribed.  She can follow-up with [place] virtually in 4-6 weeks.

## 2019-06-25 ENCOUNTER — Telehealth: Payer: Self-pay

## 2019-06-25 NOTE — Telephone Encounter (Signed)
Shawna [name] , Environmental education officer, spoke with patient.She relayed Dr.Koneswaran's message to patient and scheduled her apt.

## 2019-07-02 ENCOUNTER — Ambulatory Visit (INDEPENDENT_AMBULATORY_CARE_PROVIDER_SITE_OTHER): Payer: Medicare HMO | Admitting: Internal Medicine

## 2019-07-16 ENCOUNTER — Other Ambulatory Visit: Payer: Self-pay

## 2019-07-16 ENCOUNTER — Encounter: Payer: Self-pay | Admitting: Student

## 2019-07-16 ENCOUNTER — Ambulatory Visit (INDEPENDENT_AMBULATORY_CARE_PROVIDER_SITE_OTHER): Payer: Medicare HMO | Admitting: Student

## 2019-07-16 VITALS — BP 140/68 | HR 68 | Ht 62.0 in | Wt 129.0 lb

## 2019-07-16 DIAGNOSIS — I4729 Other ventricular tachycardia: Secondary | ICD-10-CM

## 2019-07-16 DIAGNOSIS — E785 Hyperlipidemia, unspecified: Secondary | ICD-10-CM | POA: Diagnosis not present

## 2019-07-16 DIAGNOSIS — I10 Essential (primary) hypertension: Secondary | ICD-10-CM | POA: Diagnosis not present

## 2019-07-16 DIAGNOSIS — N186 End stage renal disease: Secondary | ICD-10-CM

## 2019-07-16 DIAGNOSIS — I472 Ventricular tachycardia: Secondary | ICD-10-CM | POA: Diagnosis not present

## 2019-07-16 DIAGNOSIS — I25708 Atherosclerosis of coronary artery bypass graft(s), unspecified, with other forms of angina pectoris: Secondary | ICD-10-CM | POA: Diagnosis not present

## 2019-07-16 DIAGNOSIS — Z992 Dependence on renal dialysis: Secondary | ICD-10-CM

## 2019-07-16 DIAGNOSIS — I480 Paroxysmal atrial fibrillation: Secondary | ICD-10-CM

## 2019-07-16 MED ORDER — ISOSORBIDE MONONITRATE ER 60 MG PO TB24: 30.0000 mg | ORAL_TABLET | Freq: Every day | ORAL | 3 refills | Status: DC

## 2019-07-16 MED ORDER — ISOSORBIDE MONONITRATE ER 60 MG PO TB24: 60.0000 mg | ORAL_TABLET | Freq: Two times a day (BID) | ORAL | 3 refills | Status: DC

## 2019-07-16 MED ORDER — SIMVASTATIN 20 MG PO TABS: 20.0000 mg | ORAL_TABLET | Freq: Every day | ORAL | 3 refills | Status: DC

## 2019-07-16 NOTE — Progress Notes (Signed)
Cardiology Office Note    Date:  07/16/2019   ID:  Shawna [name], DOB 07-26-1940, MRN [id number]  PCP:  [name], MD  Cardiologist: Kate Sable, MD    Chief Complaint  Patient presents with   Hospitalization Follow-up    History of Present Illness:    Shawna [name] is a 79 y.o. female with past of CAD (s/p CABG in 2013, DES to D1 in 10/2016 with cath showing patent LIMA-LAD, SVG-RI, SVG-Mrg and SVG-RCA, cath in 07/2018 showing patent grafts with occlusion of D1 at prior stent site and progression of PDA disease --> medical management recommended), HTN, HLD, PAF (not on anticoagulation given history of GIB),NSVT (on Amiodarone)and ESRDwho presents to the office today for hospital follow-up.   She was admitted to [facility] in 05/2019 for chest pain and troponin values were flat and her EKG showed no acute ischemic changes. She did report melena during admission and from a cardiac perspective it was recommended to stop Brilinta given no recent interventions and continue on ASA 81 mg daily. She was continued on Coreg, Clonidine, Amiodarone, Imdur and Zocor with Plendil 2.5 mg daily added for additional blood pressure control and to help with her angina. She had been intolerant to Amlodipine in the past and Ranexa was not favored given her known ESRD. She did follow-up with Dr. Bronson Ing on 12/3 and was only taking Coreg once daily so it was recommended to increase this to BID.   She was again admitted from 12/7 - 06/20/2019 for hypertensive urgency in the setting of medication noncompliance. BP was elevated to 234/99 while in the ED and she reported not taking Clonidine regularly due to concerns this was going to drop her blood pressure significantly. She initially required nitroglycerin drip but was weaned back to her PO regimen during admission. HS Troponin values did peak at 287 during admission and this was thought to be secondary to demand ischemia.  It does appear Imdur was  discontinued at the time of discharge due to her report intolerances to this.  In talking with the patient today, she reports she was evaluated at [facility] last week for an episode of nonresponsiveness. She is unable to provide specific details surrounding this but says was told she might have had a TIA. She does not think her blood pressure was soft or significantly elevated at that time. She denies any recurrent episodes since and reports feeling back to baseline.   She denies any repeat episodes of chest pain within the past few weeks. She reports taking an "extra Simvastatin" when she has pain and feels like this helps. She has been taking Imdur 60 mg twice daily and did not stop this following most recent hospital discharge. She is able to confirm she did stop Brilinta. Plendil is listed on her medication list but she does not think this was sent in. She denies any specific dyspnea on exertion, orthopnea, PND or lower extremity edema. She does not take her blood pressure medications the morning of HD and says she typically does not take her blood pressure medications if SBP is less than 160 as she feels dizzy otherwise. BP is at 140/68 during today's visit and she denies any current symptoms.   Past Medical History:  Diagnosis Date   Acute on chronic respiratory failure with hypoxia ([place]) 10/10/2016   Anxiety    Arthritis    AVM (arteriovenous malformation) of colon    CAD (coronary artery disease)    a.  s/p CABG in 2013 b. most recent DES to D1 in 10/2016 with cath showing patent LIMA-LAD, SVG-RI, SVG-Mrg and SVG-RCA  c. NST in 02/2018 showing scar with no significant ischemia   Carotid artery disease (HCC)    a. 57-84% LICA, 12/9627    Chronic bronchitis (HCC)    Chronic diastolic CHF (congestive heart failure) ([place])    a. 02/2012 Echo EF 60-65%, nl wall motion, Gr 1 DD, mod MR   Colon cancer ([place]) 1992   Esophageal stricture    ESRD on hemodialysis ([place])    ESRD due to  HTN, started dialysis 2011 and gets HD at [facility] with Dr Hinda Lenis on MWF schedule.  Access is LUA AVF as of Sept 2014.    GERD (gastroesophageal reflux disease)    High cholesterol 12/2011   History of blood transfusion 07/2011; 12/2011; 01/2012 X 2; 04/2012   History of gout    History of lower GI bleeding    Hypertension    Iron deficiency anemia    Mitral regurgitation    a. Moderate by echo, 02/2012   Myocardial infarction ([place])    Ovarian cancer ([place]) 1992   Pneumonia ~ 2009   PUD (peptic ulcer disease)    TIA (transient ischemic attack)     Past Surgical History:  Procedure Laterality Date   A/V SHUNTOGRAM Left 03/19/2019   Procedure: A/V SHUNTOGRAM;  Surgeon: Katha Cabal, MD;  Location: [place] [facility];  Service: Cardiovascular;  Laterality: Left;   ABDOMINAL HYSTERECTOMY  1992   APPENDECTOMY  06/1990   AV FISTULA PLACEMENT  07/2009   left upper arm   AV FISTULA PLACEMENT Right 09/06/2016   Procedure: RIGHT FOREARM ARTERIOVENOUS (AV) GRAFT;  Surgeon: Elam Dutch, MD;  Location: [place];  Service: Vascular;  Laterality: Right;   AV FISTULA PLACEMENT N/A 02/24/2017   Procedure: INSERTION OF ARTERIOVENOUS (AV) GORE-TEX GRAFT ARM (BRACHIAL AXILLARY);  Surgeon: Katha Cabal, MD;  Location: [facility];  Service: Vascular;  Laterality: N/A;   [place] Right 09/06/2016   Procedure: REMOVAL OF Right Arm ARTERIOVENOUS GORETEX GRAFT and Vein Patch angioplasty of brachial artery;  Surgeon: Angelia Mould, MD;  Location: [place];  Service: Vascular;  Laterality: Right;   COLON RESECTION  1992   COLON SURGERY     COLONOSCOPY N/A 03/09/2019   Procedure: COLONOSCOPY;  Surgeon: Rogene Houston, MD;  [facility];  Service: Endoscopy;  Laterality: N/A;   CORONARY ANGIOPLASTY WITH STENT PLACEMENT  12/15/11   "2"   CORONARY ANGIOPLASTY WITH STENT PLACEMENT  y/2013   "1; makes total of 3" (05/02/2012)   CORONARY ARTERY BYPASS GRAFT   06/13/2012   Procedure: CORONARY ARTERY BYPASS GRAFTING (CABG);  Surgeon: Grace Isaac, MD;  Location: [place];  Service: Open Heart Surgery;  Laterality: N/A;  cabg x four;  using left internal mammary artery, and left leg greater saphenous vein harvested endoscopically   CORONARY STENT INTERVENTION N/A 10/13/2016   Procedure: Coronary Stent Intervention;  Surgeon: Troy Sine, MD;  Location: [place] CV LAB;  Service: Cardiovascular;  Laterality: N/A;   DIALYSIS/PERMA CATHETER REMOVAL N/A 04/18/2017   Procedure: DIALYSIS/PERMA CATHETER REMOVAL;  Surgeon: Katha Cabal, MD;  Location: [place] [facility];  Service: Cardiovascular;  Laterality: N/A;   DILATION AND CURETTAGE OF UTERUS     ESOPHAGOGASTRODUODENOSCOPY  01/20/2012   Procedure: ESOPHAGOGASTRODUODENOSCOPY (EGD);  Surgeon: Ladene Artist, MD,FACG;  Location: [facility] ENDOSCOPY;  Service: Endoscopy;  Laterality: N/A;   ESOPHAGOGASTRODUODENOSCOPY N/A 03/26/2013   Procedure: ESOPHAGOGASTRODUODENOSCOPY (EGD);  Surgeon: Irene Shipper, MD;  Location: [facility] ENDOSCOPY;  Service: Endoscopy;  Laterality: N/A;   ESOPHAGOGASTRODUODENOSCOPY N/A 04/30/2015   Procedure: ESOPHAGOGASTRODUODENOSCOPY (EGD);  Surgeon: Rogene Houston, MD;  [facility];  Service: Endoscopy;  Laterality: N/A;  1pm - moved to 10/20 @ 1:10   ESOPHAGOGASTRODUODENOSCOPY N/A 07/29/2016   Procedure: ESOPHAGOGASTRODUODENOSCOPY (EGD);  Surgeon: Manus Gunning, MD;  Location: [place];  Service: Gastroenterology;  Laterality: N/A;  enteroscopy   GIVENS CAPSULE STUDY N/A 03/07/2019   Procedure: GIVENS CAPSULE STUDY;  Surgeon: Rogene Houston, MD;  [facility];  Service: Endoscopy;  Laterality: N/A;  7:30   INTRAOPERATIVE TRANSESOPHAGEAL ECHOCARDIOGRAM  06/13/2012   Procedure: INTRAOPERATIVE TRANSESOPHAGEAL ECHOCARDIOGRAM;  Surgeon: Grace Isaac, MD;  Location: [place];  Service: Open Heart Surgery;  Laterality: N/A;   IR GENERIC HISTORICAL   07/26/2016   IR FLUORO GUIDE CV LINE RIGHT 07/26/2016 Greggory Keen, MD [facility]   IR GENERIC HISTORICAL  07/26/2016   IR US GUIDE VASC ACCESS RIGHT 07/26/2016 Greggory Keen, MD [facility]   IR GENERIC HISTORICAL  08/02/2016   IR US GUIDE VASC ACCESS RIGHT 08/02/2016 Greggory Keen, MD [facility]   IR GENERIC HISTORICAL  08/02/2016   IR FLUORO GUIDE CV LINE RIGHT 08/02/2016 Greggory Keen, MD [facility]   IR RADIOLOGY PERIPHERAL GUIDED IV START  03/28/2017   IR US GUIDE VASC ACCESS RIGHT  03/28/2017   LEFT HEART CATH AND CORONARY ANGIOGRAPHY N/A 09/20/2016   Procedure: Left Heart Cath and Coronary Angiography;  Surgeon: [name], MD;  Location: [place] [facility];  Service: Cardiovascular;  Laterality: N/A;   LEFT HEART CATH AND CORS/GRAFTS ANGIOGRAPHY N/A 10/13/2016   Procedure: Left Heart Cath and Cors/Grafts Angiography;  Surgeon: Troy Sine, MD;  Location: [place] [facility];  Service: Cardiovascular;  Laterality: N/A;   LEFT HEART CATH AND CORS/GRAFTS ANGIOGRAPHY N/A 07/13/2018   Procedure: LEFT HEART CATH AND CORS/GRAFTS ANGIOGRAPHY;  Surgeon: Martinique, Peter M, MD;  Location: [place] [facility];  Service: Cardiovascular;  Laterality: N/A;   LEFT HEART CATHETERIZATION WITH CORONARY ANGIOGRAM N/A 12/15/2011   Procedure: LEFT HEART CATHETERIZATION WITH CORONARY ANGIOGRAM;  Surgeon: Burnell Blanks, MD;  Location: [facility] CATH LAB;  Service: Cardiovascular;  Laterality: N/A;   LEFT HEART CATHETERIZATION WITH CORONARY ANGIOGRAM N/A 01/10/2012   Procedure: LEFT HEART CATHETERIZATION WITH CORONARY ANGIOGRAM;  Surgeon: Peter M Martinique, MD;  Location: [facility] CATH LAB;  Service: Cardiovascular;  Laterality: N/A;   LEFT HEART CATHETERIZATION WITH CORONARY ANGIOGRAM N/A 06/08/2012   Procedure: LEFT HEART CATHETERIZATION WITH CORONARY ANGIOGRAM;  Surgeon: Burnell Blanks, MD;  Location: [facility] CATH LAB;  Service: Cardiovascular;  Laterality: N/A;   LEFT HEART CATHETERIZATION WITH  CORONARY/GRAFT ANGIOGRAM N/A 12/10/2013   Procedure: LEFT HEART CATHETERIZATION WITH Beatrix Fetters;  Surgeon: [name], MD;  Location: [facility] CATH LAB;  Service: Cardiovascular;  Laterality: N/A;   OVARY SURGERY     ovarian cancer   POLYPECTOMY  03/09/2019   Procedure: POLYPECTOMY;  Surgeon: Rogene Houston, MD;  [facility];  Service: Endoscopy;;  cecal    REVISION OF ARTERIOVENOUS GORETEX GRAFT N/A 02/24/2017   Procedure: REVISION OF ARTERIOVENOUS GORETEX GRAFT (RESECTION);  Surgeon: Katha Cabal, MD;  Location: [facility];  Service: Vascular;  Laterality: N/A;   SHUNTOGRAM N/A 10/15/2013   Procedure: Fistulogram;  Surgeon: Serafina Mitchell, MD;  Location: [place] CATH LAB;  Service: Cardiovascular;  Laterality: N/A;   THROMBECTOMY / ARTERIOVENOUS GRAFT REVISION  2011   left upper arm   TUBAL LIGATION  1980's   UPPER EXTREMITY ANGIOGRAPHY Bilateral 12/06/2016   Procedure: Upper Extremity Angiography;  Surgeon: Katha Cabal, MD;  Location: [place] [facility];  Service: Cardiovascular;  Laterality: Bilateral;   UPPER EXTREMITY INTERVENTION Left 06/06/2017   Procedure: UPPER EXTREMITY INTERVENTION;  Surgeon: Katha Cabal, MD;  Location: [place] [facility];  Service: Cardiovascular;  Laterality: Left;    Current Medications: Outpatient Medications Prior to Visit  Medication Sig Dispense Refill   ALPRAZolam (XANAX) 0.5 MG tablet Take 0.5 mg by mouth daily as needed for anxiety.      amiodarone (PACERONE) 200 MG tablet Take 1 tablet (200 mg total) by mouth daily. (Patient taking differently: Take 100 mg by mouth daily. ) 90 tablet 1   aspirin EC 81 MG tablet Take 1 tablet (81 mg total) by mouth daily. Restart on 03/13/19     benzonatate (TESSALON) 100 MG capsule Take 100 mg by mouth 3 (three) times daily as needed for cough.      carvedilol (COREG) 12.5 MG tablet Take 1 tablet (12.5 mg total) by mouth 2 (two) times daily with a meal. Additional  refills per PCP (Patient taking differently: Take 12.5 mg by mouth daily as needed (for high blood pressure levels). ) 180 tablet 1   cloNIDine (CATAPRES) 0.1 MG tablet Take 1 tablet (0.1 mg total) by mouth 2 (two) times daily. (Patient taking differently: Take 0.1 mg by mouth 2 (two) times daily as needed (for blood pressure levels). ) 60 tablet 2   felodipine (PLENDIL) 2.5 MG 24 hr tablet Take 1 tablet (2.5 mg total) by mouth daily. (Patient taking differently: Take 2.5 mg by mouth daily as needed (for high blood pressure levels). ) 30 tablet 1   fluticasone (FLONASE) 50 MCG/ACT nasal spray Place 1 spray at bedtime as needed into both nostrils for allergies.      gabapentin (NEURONTIN) 100 MG capsule Take 100 mg by mouth at bedtime.     lidocaine-prilocaine (EMLA) cream Apply 1 application topically every Monday, Wednesday, and Friday. Prior to dialysis     loratadine (CLARITIN) 10 MG tablet Take 1 tablet (10 mg total) by mouth daily as needed for allergies. 30 tablet 0   multivitamin (RENA-VIT) TABS tablet Take 1 tablet by mouth daily.     nitroGLYCERIN (NITROSTAT) 0.4 MG SL tablet DISSOLVE ONE TABLET UNDER THE TONGUE EVERY 5 MINUTES AS NEEDED FOR CHEST PAIN.  DO NOT EXCEED A TOTAL OF 3 DOSES IN 15 MINUTES (Patient taking differently: Place 0.4 mg under the tongue every 5 (five) minutes as needed for chest pain. ) 25 tablet 2   nystatin ointment (MYCOSTATIN) APPLY A SMALL AMOUNT TOPICALLY TWICE DAILY FOR 7 DAYS TO EXTERNAL ANAL AREA FOR ANAL ITCHINESS     omeprazole (PRILOSEC) 20 MG capsule Take 2 capsules (40 mg total) by mouth 2 (two) times daily before a meal. (Patient taking differently: Take 20 mg by mouth 2 (two) times daily before a meal. ) 60 capsule 1   ondansetron (ZOFRAN-ODT) 4 MG disintegrating tablet Take 4 mg by mouth every 8 (eight) hours as needed for nausea or vomiting.      polyethylene glycol (MIRALAX / GLYCOLAX) 17 g packet Take 17 g by mouth daily as needed for mild  constipation. 14 each 0   sevelamer carbonate (RENVELA) 800 MG  tablet Take 1,600-2,400 mg by mouth See admin instructions. Take three tablets with meals and two tablets with snacks     rosuvastatin (CRESTOR) 10 MG tablet Take 1 tablet (10 mg total) by mouth daily. (Patient taking differently: Take 10 mg by mouth every evening. ) 30 tablet 1   No facility-administered medications prior to visit.     Allergies:   Aspirin, Penicillins, Amlodipine, Bactrim [sulfamethoxazole-trimethoprim], Contrast media [iodinated diagnostic agents], Iron, Nitrofurantoin, Tylenol [acetaminophen], Gabapentin, Hydralazine, Dexilant [dexlansoprazole], Levaquin [levofloxacin in d5w], Morphine and related, Plavix [clopidogrel bisulfate], Protonix [pantoprazole sodium], and Venofer [ferric oxide]   Social History   Socioeconomic History   Marital status: Married    Spouse name: [name]   Number of children: Not on file   Years of education: Not on file   Highest education level: Not on file  Occupational History   Not on file  Tobacco Use   Smoking status: Never Smoker   Smokeless tobacco: Never Used  Substance and Sexual Activity   Alcohol use: No    Alcohol/week: 0.0 standard drinks   Drug use: No   Sexual activity: Yes    Birth control/protection: Surgical  Other Topics Concern   Not on file  Social History Narrative   Lives in [place], [place] with husband.  Dialysis pt - [facility].   Social Determinants of Health   Financial Resource Strain: Low Risk    Difficulty of Paying Living Expenses: Not very hard  Food Insecurity: No Food Insecurity   Worried About Charity fundraiser in the Last Year: Never true   Ran Out of Food in the Last Year: Never true  Transportation Needs: No Transportation Needs   Lack of Transportation (Medical): No   Lack of Transportation (Non-Medical): No  Physical Activity: Unknown   Days of Exercise per Week: Patient refused   Minutes of Exercise per Session:  Patient refused  Stress: No Stress Concern Present   Feeling of Stress : Only a little  Social Connections: Unknown   Frequency of Communication with Friends and Family: Patient refused   Frequency of Social Gatherings with Friends and Family: Patient refused   Attends Religious Services: Patient refused   Marine scientist or Organizations: Patient refused   Attends Music therapist: Patient refused   Marital Status: Patient refused     Family History:  The patient's family history includes Colon cancer (age of onset: 62) in her child; Diabetes in her brother, father, mother, and sister; Heart attack in her brother, brother, and mother; Heart disease in her father and mother; Hyperlipidemia in her brother, father, and mother; Hypertension in her father, mother, sister, and sister; Other in an other family member.   Review of Systems:   Please see the history of present illness.     General:  No chills, fever, night sweats or weight changes.  Cardiovascular:  No dyspnea on exertion, edema, orthopnea, palpitations, paroxysmal nocturnal dyspnea. Positive for chest pain (now resolved).  Dermatological: No rash, lesions/masses Respiratory: No cough, dyspnea Urologic: No hematuria, dysuria Abdominal:   No nausea, vomiting, diarrhea, bright red blood per rectum, melena, or hematemesis Neurologic:  No visual changes, wkns, changes in mental status. All other systems reviewed and are otherwise negative except as noted above.   Physical Exam:    VS:  BP 140/68    Pulse 68    Ht 5\' 2"  (1.575 m)    Wt 129 lb (58.5 kg)    SpO2 97%  BMI 23.59 kg/m    General: Well developed, well nourished,female appearing in no acute distress. Head: Normocephalic, atraumatic, sclera non-icteric, no xanthomas, nares are without discharge.  Neck: No carotid bruits. JVD not elevated.  Lungs: Respirations regular and unlabored, without wheezes or rales.  Heart: Regular rate and rhythm.  No S3 or S4.  No murmur, no rubs, or gallops appreciated. Abdomen: Soft, non-tender, non-distended with normoactive bowel sounds. No hepatomegaly. No rebound/guarding. No obvious abdominal masses. Msk:  Strength and tone appear normal for age. No joint deformities or effusions. Extremities: No clubbing or cyanosis. No lower extremity edema. Compression stockings in place. Distal pedal pulses are 2+ bilaterally. Neuro: Alert and oriented X 3. Moves all extremities spontaneously. No focal deficits noted. Psych:  Responds to questions appropriately with a normal affect. Skin: No rashes or lesions noted  Wt Readings from Last 3 Encounters:  07/16/19 129 lb (58.5 kg)  06/19/19 129 lb 13.6 oz (58.9 kg)  06/13/19 126 lb 12.8 oz (57.5 kg)     Studies/Labs Reviewed:   EKG:  EKG is not ordered today.  EKG from 06/18/2019 personally reviewed and shows NSR, HR 69 with incomplete LBBB.   Recent Labs: 12/14/2018: Magnesium 2.0 05/17/2019: ALT 9 06/19/2019: BUN 35; Creatinine, Ser 7.01; Hemoglobin 9.0; Platelets 194; Potassium 3.5; Sodium 132   Lipid Panel    Component Value Date/Time   CHOL 93 10/16/2017 0539   TRIG 56 10/16/2017 0539   HDL 56 10/16/2017 0539   CHOLHDL 1.7 10/16/2017 0539   VLDL 11 10/16/2017 0539   LDLCALC 26 10/16/2017 0539    Additional studies/ records that were reviewed today include:   Cardiac Catheterization: 07/2018  Ost RPDA to RPDA lesion is 75% stenosed.  Mid LM to Dist LM lesion is 60% stenosed.  Ost Ramus to Ramus lesion is 80% stenosed.  Ost Cx to Mid Cx lesion is 99% stenosed.  Prox LAD lesion is 90% stenosed.  Ost 1st Diag to 1st Diag lesion is 100% stenosed.  Ost RCA to Prox RCA lesion is 100% stenosed.  LIMA graft was visualized by angiography and is large.  The graft exhibits no disease.  SVG graft was visualized by angiography and is large.  The graft exhibits no disease.  SVG graft was visualized by angiography and is normal in  caliber.  SVG graft was visualized by angiography and is large.  The graft exhibits no disease.  The left ventricular systolic function is normal.  LV end diastolic pressure is mildly elevated.  The left ventricular ejection fraction is 55-65% by visual estimate.   1. Severe 3 vessel occlusive CAD    - 60% distal left main    - 90% mid LAD    - 100% first diagonal at prior stent site    - 99% proximal LCx at prior stent site    - 80% ramus intermediate.    - 100% ostial RCA at prior stent site    - 75% proximal PDA 2. Patent LIMA to the LAD 3. Patent SVG to ramus intermediate 4. Patent SVG to OM 5. Patent SVG to the distal RCA. 6. Good LV function 7. Mildly elevated LVEDP  Plan: Compared to prior cath in April 2018 the diagonal is now occluded. There is progression of disease in the PDA. It is unclear if this is the reason for her chest pain. She is severely hypertensive and EDP is mildly elevated. I would recommend medical management with good BP control. If she has refractory angina  despite good BP and volume management we could consider PCI of the PDA but this would have to be done through the SVG and in general she has not responded well to stenting in the past with all prior stents occluded. I will increase her Coreg dose. She will need dialysis in am.   Echocardiogram: 12/2018 IMPRESSIONS    1. The left ventricle has normal systolic function with an ejection fraction of 60-65%. The cavity size was normal. There is mild concentric left ventricular hypertrophy. Left ventricular diastolic Doppler parameters are consistent with impaired  relaxation. Elevated left ventricular end-diastolic pressure No evidence of left ventricular regional wall motion abnormalities.  2. Left atrial size was mildly dilated.  3. The mitral valve is grossly normal. Mild thickening of the mitral valve leaflet. There is mild mitral annular calcification present. Mitral valve regurgitation is mild to  moderate by color flow Doppler.  4. The tricuspid valve is grossly normal.  5. The aortic valve is tricuspid. Mild thickening of the aortic valve.  6. Moderate aortic annular calcification.  7. The aortic root is normal in size and structure.  Assessment:    1. Coronary artery disease of bypass graft of native heart with stable angina pectoris ([place])   2. Essential hypertension   3. Hyperlipidemia LDL goal <70   4. NSVT (nonsustained ventricular tachycardia) ([place])   5. PAF (paroxysmal atrial fibrillation) ([place])   6. ESRD on hemodialysis ([place])      Plan:   In order of problems listed above:  1. CAD - s/p CABG in 2013 with DES to D1 in 10/2016 with cath showing patent LIMA-LAD, SVG-RI, SVG-Mrg and SVG-RCA. Most recent cath in 07/2018 showed patent grafts with occlusion of D1 at prior stent site and progression of PDA disease with medical management recommended. -She denies any recurrent episodes of chest pain within the past several weeks. She was taking an extra Simvastatin as needed for chest pain and reviewed utilizing nitroglycerin instead. Also encouraged her to make [place] aware as Imdur could be further titrated if needed. It is also unclear if she is taking Felodipine as this was started for antianginal benefit during her prior admission. - Continue ASA 81 mg daily, Coreg 12.5 mg twice daily, Imdur 60mg  BID and Simvastatin 20 mg daily.  2. HTN - BP is at 140/68 during today's visit which is overall good when compared to her BP trend. We reviewed the importance of compliance with her regimen but she typically holds her medications if SBP is less than 160.  Remains on Clonidine 0.1 mg twice daily, Coreg 12.5 mg twice daily and Imdur 60 mg twice daily.  3. HLD - Followed by PCP. Goal LDL is less than 70 in the setting of known CAD. She has been intolerant to high intensity statins in the past and remains on Simvastatin 20 mg daily.  4. NSVT - she is on low-dose Amiodarone 100 mg daily  (previously on 200mg  daily but self-reduced due to thinking this was causing hair loss). LFT's WNL in 05/2019. Will request recent labs from PCP. If TSH not obtained within the past 6 months, would need to be rechecked.   5. Paroxysmal Atrial Fibrillation - She was in normal sinus rhythm during her recent admission and is in sinus rhythm by examination today. She remains on Amiodarone 100 mg daily along with Coreg 12.5 mg twice daily. She is not on anticoagulation given her history of recurrent GIB (most recently hospitalized for melena in 05/2019).  6. ESRD - on HD - MWF schedule.    Medication Adjustments/Labs and Tests Ordered: Current medicines are reviewed at length with the patient today.  Concerns regarding medicines are outlined above.  Medication changes, Labs and Tests ordered today are listed in the Patient Instructions below. Patient Instructions  Medication Instructions:  Your physician recommends that you continue on your current medications as directed. Please refer to the Current Medication list given to you today.  PLEASE CALL BACK TO VERIFY FELODIPINE   Labwork: NONE  Testing/Procedures: NONE  Follow-Up: Your physician recommends that you schedule a follow-up appointment in: 3 MONTHS   Any Other Special Instructions Will Be Listed Below (If Applicable).  If you need a refill on your cardiac medications before your next appointment, please call your pharmacy.    Signed, Erma Heritage, PA-C  07/16/2019 4:36 PM    [facility] [address] [place], [place] [address] Phone: [phone] Fax: [phone]

## 2019-07-16 NOTE — Patient Instructions (Signed)
Medication Instructions:  Your physician recommends that you continue on your current medications as directed. Please refer to the Current Medication list given to you today.   PLEASE CALL BACK TO VERIFY FELODIPINE   Labwork: NONE  Testing/Procedures: NONE  Follow-Up: Your physician recommends that you schedule a follow-up appointment in: 3 MONTHS    Any Other Special Instructions Will Be Listed Below (If Applicable).     If you need a refill on your cardiac medications before your next appointment, please call your pharmacy.

## 2019-07-22 ENCOUNTER — Other Ambulatory Visit: Payer: Self-pay | Admitting: Cardiovascular Disease

## 2019-07-23 ENCOUNTER — Other Ambulatory Visit: Payer: Self-pay

## 2019-08-16 ENCOUNTER — Other Ambulatory Visit: Payer: Self-pay

## 2019-08-16 DIAGNOSIS — D3001 Benign neoplasm of right kidney: Secondary | ICD-10-CM

## 2019-08-26 ENCOUNTER — Telehealth: Payer: Self-pay | Admitting: Cardiovascular Disease

## 2019-08-26 NOTE — Telephone Encounter (Signed)
Patient is currently at dialysis - stated that her BP is high but that her heart rate is even higher at 122

## 2019-08-26 NOTE — Telephone Encounter (Signed)
Pt walked into office and made aware - she will continue to monitor HR and symptoms

## 2019-08-26 NOTE — Telephone Encounter (Signed)
Heart rate is susceptible to variability during dialysis with volume removal.  I would continue to monitor vitals.

## 2019-08-26 NOTE — Telephone Encounter (Signed)
Pt says HR is 122 while at dialysis - says she feel some palpitations - denies any other symptoms - says BP WNL SYB 120s-130s

## 2019-09-04 ENCOUNTER — Other Ambulatory Visit: Payer: Self-pay | Admitting: Cardiovascular Disease

## 2019-09-10 ENCOUNTER — Other Ambulatory Visit: Payer: Self-pay

## 2019-09-10 ENCOUNTER — Encounter (INDEPENDENT_AMBULATORY_CARE_PROVIDER_SITE_OTHER): Payer: Self-pay | Admitting: Internal Medicine

## 2019-09-10 ENCOUNTER — Ambulatory Visit (INDEPENDENT_AMBULATORY_CARE_PROVIDER_SITE_OTHER): Payer: Medicare HMO | Admitting: Internal Medicine

## 2019-09-10 VITALS — BP 179/75 | HR 73 | Temp 97.4°F | Ht 61.0 in | Wt 137.1 lb

## 2019-09-10 DIAGNOSIS — D132 Benign neoplasm of duodenum: Secondary | ICD-10-CM | POA: Insufficient documentation

## 2019-09-10 DIAGNOSIS — Z8719 Personal history of other diseases of the digestive system: Secondary | ICD-10-CM | POA: Diagnosis not present

## 2019-09-10 DIAGNOSIS — K921 Melena: Secondary | ICD-10-CM

## 2019-09-10 DIAGNOSIS — K6389 Other specified diseases of intestine: Secondary | ICD-10-CM | POA: Diagnosis not present

## 2019-09-10 MED ORDER — METRONIDAZOLE 250 MG PO TABS: 250.0000 mg | ORAL_TABLET | Freq: Three times a day (TID) | ORAL | 0 refills | Status: DC

## 2019-09-10 MED ORDER — OMEPRAZOLE 40 MG PO CPDR: 40.0000 mg | DELAYED_RELEASE_CAPSULE | Freq: Every day | ORAL | 5 refills | Status: DC

## 2019-09-10 NOTE — Patient Instructions (Signed)
We will schedule esophagogastroduodenoscopy with duodenal polypectomy in April 2021. Please call office with progress report when you finish metronidazole or earlier if you experience side effects.

## 2019-09-10 NOTE — Progress Notes (Signed)
Presenting complaint;  Follow-up for chronic GERD.  History of GI bleed.  Patient complains of intractable flatulence  Database and subjective:  Patient is 79 year old African-American female who is here for scheduled visit.  She was last seen 3 months ago.  She was complaining of intractable flatulence.  I felt she had small intestinal bacterial overgrowth and she was given metronidazole.  Patient noted improvement but does not remember any more details.  She says she is passing excessive amount of flatus.  She says it is uncontrollable.  She is afraid to be in social gatherings.  She is prone to constipation but she did have diarrhea Sunday night.  She had 7 stools.  She took 3 Imodium sent yesterday she had no stool in 1 this morning.  She did not experience nausea vomiting abdominal pain melena or rectal bleeding.  She says her appetite is good.  She has gained 14 pounds in the last 3 months.   She says heartburn is well controlled with therapy.  She needs a new prescription for omeprazole.  She received first shot of Covid vaccine last week and did not have any side effects. She states her last hemoglobin was more than 10 g at the dialysis clinic.  Current Medications: Outpatient Encounter Medications as of 09/10/2019  Medication Sig  . ALPRAZolam (XANAX) 0.5 MG tablet Take 0.5 mg by mouth daily as needed for anxiety.   Marland Kitchen amiodarone (PACERONE) 200 MG tablet Take 1 tablet (200 mg total) by mouth daily. (Patient taking differently: Take 100 mg by mouth daily. )  . aspirin EC 81 MG tablet Take 1 tablet (81 mg total) by mouth daily. Restart on 03/13/19  . benzonatate (TESSALON) 100 MG capsule Take 100 mg by mouth 3 (three) times daily as needed for cough.   . carvedilol (COREG) 12.5 MG tablet Take 1 tablet (12.5 mg total) by mouth 2 (two) times daily with a meal. Additional refills per PCP (Patient taking differently: Take 12.5 mg by mouth daily as needed (for high blood pressure levels). )  .  cloNIDine (CATAPRES) 0.1 MG tablet Take 1 tablet (0.1 mg total) by mouth 2 (two) times daily. (Patient taking differently: Take 0.1 mg by mouth 2 (two) times daily as needed (for blood pressure levels). )  . felodipine (PLENDIL) 2.5 MG 24 hr tablet Take 1 tablet (2.5 mg total) by mouth daily. (Patient taking differently: Take 2.5 mg by mouth daily as needed (for high blood pressure levels). )  . fluticasone (FLONASE) 50 MCG/ACT nasal spray Place 1 spray at bedtime as needed into both nostrils for allergies.   Marland Kitchen gabapentin (NEURONTIN) 100 MG capsule Take 100 mg by mouth at bedtime.  . isosorbide mononitrate (IMDUR) 60 MG 24 hr tablet Take 1 tablet (60 mg total) by mouth 2 (two) times daily.  Marland Kitchen lidocaine-prilocaine (EMLA) cream Apply 1 application topically every Monday, Wednesday, and Friday. Prior to dialysis  . loratadine (CLARITIN) 10 MG tablet Take 1 tablet (10 mg total) by mouth daily as needed for allergies.  . multivitamin (RENA-VIT) TABS tablet Take 1 tablet by mouth daily.  . nitroGLYCERIN (NITROSTAT) 0.4 MG SL tablet DISSOLVE ONE TABLET UNDER THE TONGUE EVERY 5 MINUTES AS NEEDED FOR CHEST PAIN.  DO NOT EXCEED A TOTAL OF 3 DOSES IN 15 MINUTES  . nystatin ointment (MYCOSTATIN) APPLY A SMALL AMOUNT TOPICALLY TWICE DAILY FOR 7 DAYS TO EXTERNAL ANAL AREA FOR ANAL ITCHINESS  . omeprazole (PRILOSEC) 20 MG capsule Take 2 capsules (40 mg total)  by mouth 2 (two) times daily before a meal. (Patient taking differently: Take 20 mg by mouth 2 (two) times daily before a meal. )  . ondansetron (ZOFRAN-ODT) 4 MG disintegrating tablet Take 4 mg by mouth every 8 (eight) hours as needed for nausea or vomiting.   . polyethylene glycol (MIRALAX / GLYCOLAX) 17 g packet Take 17 g by mouth daily as needed for mild constipation.  . sevelamer carbonate (RENVELA) 800 MG tablet Take 1,600-2,400 mg by mouth See admin instructions. Take three tablets with meals and two tablets with snacks  . simvastatin (ZOCOR) 20 MG tablet  TAKE 1 TABLET BY MOUTH ONCE DAILY AT BEDTIME   No facility-administered encounter medications on file as of 09/10/2019.     Objective: Blood pressure (!) 179/75, pulse 73, temperature (!) 97.4 F (36.3 C), temperature source Temporal, height 5' 1"  (1.549 m), weight 137 lb 1.6 oz (62.2 kg). Patient is alert and in no acute distress. She is wearing a facial mask. Conjunctiva is pink. Sclera is nonicteric Oropharyngeal mucosa is normal. No neck masses or thyromegaly noted. Cardiac exam with regular rhythm normal S1 and S2. No murmur or gallop noted. Lungs are clear to auscultation. Abdomen is symmetrical.  She has lower midline scar covering the long left side of umbilicus.  Bowel sounds are normal.  No bruits noted.  Abdomen is soft and nontender with no organomegaly or masses.  Percussion note is somewhat embedded. No LE edema or clubbing noted.  Labs/studies Results:  CBC Latest Ref Rng & Units 06/19/2019 06/18/2019 06/17/2019  WBC 4.0 - 10.5 K/uL 4.4 5.0 6.2  Hemoglobin 12.0 - 15.0 g/dL 9.0(L) 9.2(L) 10.3(L)  Hematocrit 36.0 - 46.0 % 28.9(L) 29.6(L) 33.7(L)  Platelets 150 - 400 K/uL 194 200 232    CMP Latest Ref Rng & Units 06/19/2019 06/18/2019 06/17/2019  Glucose 70 - 99 mg/dL 97 79 84  BUN 8 - 23 mg/dL 35(H) 64(H) 56(H)  Creatinine 0.44 - 1.00 mg/dL 7.01(H) 11.94(H) 10.49(H)  Sodium 135 - 145 mmol/L 132(L) 137 139  Potassium 3.5 - 5.1 mmol/L 3.5 4.9 4.2  Chloride 98 - 111 mmol/L 92(L) 100 94(L)  CO2 22 - 32 mmol/L 27 19(L) 27  Calcium 8.9 - 10.3 mg/dL 8.8(L) 9.4 10.0  Total Protein 6.5 - 8.1 g/dL - - -  Total Bilirubin 0.3 - 1.2 mg/dL - - -  Alkaline Phos 38 - 126 U/L - - -  AST 15 - 41 U/L - - -  ALT 0 - 44 U/L - - -    Hepatic Function Latest Ref Rng & Units 06/19/2019 06/05/2019 05/17/2019  Total Protein 6.5 - 8.1 g/dL - - 6.8  Albumin 3.5 - 5.0 g/dL 3.2(L) 2.9(L) 3.5  AST 15 - 41 U/L - - 7(L)  ALT 0 - 44 U/L - - 9  Alk Phosphatase 38 - 126 U/L - - 73  Total Bilirubin 0.3  - 1.2 mg/dL - - 0.4  Bilirubin, Direct 0.0 - 0.2 mg/dL - - -    Lab Results  Component Value Date   CRP 14.2 (H) 05/19/2016    Hemoglobin greater than 10 g 2 weeks ago according to patient.   I reviewed EGD records from January 2018 and noted that she had a duodenal polyp and Dr. Havery Moros.   Assessment:  #1.  Intractable bloating most likely secondary to small intestinal bacterial overgrowth.  It appears she has responded to metronidazole in the past.  Will retrieve this medication and see how she  does.  #2.  History of chronic GI bleed.  Some of these episodes may have been related to colonic diverticulosis.  Patient has not taking anticoagulants anymore.  It appears her hemoglobin has gradually creeping up.  #3.  History of duodenal polyp found on EGD of January 2018 by Dr. Havery Moros. He was not able to remove this polyp.  Patient has not had a follow-up exam.  I was not aware of this finding until today.  Chronic GERD.  Patient appears to be doing well on omeprazole.  Dose could be simplified to once daily.  Plan:  Metronidazole to 50 mg by mouth 3 times a day for 10 days. Patient advised to call office when she finishes antibiotic. Esophagogastroduodenoscopy with duodenal polypectomy to be scheduled in April 2020. New prescription for omeprazole 40 mg p.o. every morning sent to patient's pharmacy. Office visit in 3 months.

## 2019-09-12 ENCOUNTER — Other Ambulatory Visit (INDEPENDENT_AMBULATORY_CARE_PROVIDER_SITE_OTHER): Payer: Self-pay | Admitting: *Deleted

## 2019-09-12 DIAGNOSIS — D132 Benign neoplasm of duodenum: Secondary | ICD-10-CM

## 2019-09-24 ENCOUNTER — Other Ambulatory Visit (HOSPITAL_COMMUNITY)
Admission: RE | Admit: 2019-09-24 | Discharge: 2019-09-24 | Disposition: A | Discharge status: Home / Self Care | Payer: Medicare HMO | Source: Ambulatory Visit | Attending: Internal Medicine | Admitting: Internal Medicine

## 2019-09-24 ENCOUNTER — Other Ambulatory Visit: Payer: Self-pay

## 2019-09-24 DIAGNOSIS — Z01812 Encounter for preprocedural laboratory examination: Secondary | ICD-10-CM | POA: Diagnosis present

## 2019-09-24 DIAGNOSIS — Z20822 Contact with and (suspected) exposure to covid-19: Secondary | ICD-10-CM | POA: Diagnosis not present

## 2019-09-24 LAB — SARS CORONAVIRUS 2 (TAT 6-24 HRS): SARS Coronavirus 2: NEGATIVE

## 2019-09-26 ENCOUNTER — Encounter (HOSPITAL_COMMUNITY)
Admission: RE | Disposition: A | Discharge status: Home / Self Care | Payer: Self-pay | Source: Home / Self Care | Attending: Internal Medicine

## 2019-09-26 ENCOUNTER — Ambulatory Visit (HOSPITAL_COMMUNITY)
Admission: RE | Admit: 2019-09-26 | Discharge: 2019-09-26 | Disposition: A | Discharge status: Home / Self Care | Payer: Medicare HMO | Attending: Internal Medicine | Admitting: Internal Medicine

## 2019-09-26 ENCOUNTER — Other Ambulatory Visit: Payer: Self-pay

## 2019-09-26 ENCOUNTER — Encounter (HOSPITAL_COMMUNITY): Payer: Self-pay | Admitting: Internal Medicine

## 2019-09-26 DIAGNOSIS — I132 Hypertensive heart and chronic kidney disease with heart failure and with stage 5 chronic kidney disease, or end stage renal disease: Secondary | ICD-10-CM | POA: Insufficient documentation

## 2019-09-26 DIAGNOSIS — K449 Diaphragmatic hernia without obstruction or gangrene: Secondary | ICD-10-CM | POA: Diagnosis not present

## 2019-09-26 DIAGNOSIS — I252 Old myocardial infarction: Secondary | ICD-10-CM | POA: Diagnosis not present

## 2019-09-26 DIAGNOSIS — Z955 Presence of coronary angioplasty implant and graft: Secondary | ICD-10-CM | POA: Insufficient documentation

## 2019-09-26 DIAGNOSIS — I5032 Chronic diastolic (congestive) heart failure: Secondary | ICD-10-CM | POA: Insufficient documentation

## 2019-09-26 DIAGNOSIS — D132 Benign neoplasm of duodenum: Secondary | ICD-10-CM

## 2019-09-26 DIAGNOSIS — N186 End stage renal disease: Secondary | ICD-10-CM | POA: Diagnosis not present

## 2019-09-26 DIAGNOSIS — Z8543 Personal history of malignant neoplasm of ovary: Secondary | ICD-10-CM | POA: Diagnosis not present

## 2019-09-26 DIAGNOSIS — Z8719 Personal history of other diseases of the digestive system: Secondary | ICD-10-CM | POA: Diagnosis not present

## 2019-09-26 DIAGNOSIS — Z8711 Personal history of peptic ulcer disease: Secondary | ICD-10-CM | POA: Insufficient documentation

## 2019-09-26 DIAGNOSIS — Z8673 Personal history of transient ischemic attack (TIA), and cerebral infarction without residual deficits: Secondary | ICD-10-CM | POA: Insufficient documentation

## 2019-09-26 DIAGNOSIS — Z79899 Other long term (current) drug therapy: Secondary | ICD-10-CM | POA: Diagnosis not present

## 2019-09-26 DIAGNOSIS — I2510 Atherosclerotic heart disease of native coronary artery without angina pectoris: Secondary | ICD-10-CM | POA: Diagnosis not present

## 2019-09-26 DIAGNOSIS — Z7982 Long term (current) use of aspirin: Secondary | ICD-10-CM | POA: Diagnosis not present

## 2019-09-26 DIAGNOSIS — K317 Polyp of stomach and duodenum: Secondary | ICD-10-CM | POA: Diagnosis not present

## 2019-09-26 DIAGNOSIS — Z951 Presence of aortocoronary bypass graft: Secondary | ICD-10-CM | POA: Diagnosis not present

## 2019-09-26 DIAGNOSIS — Z992 Dependence on renal dialysis: Secondary | ICD-10-CM | POA: Insufficient documentation

## 2019-09-26 HISTORY — PX: BIOPSY: SHX5522

## 2019-09-26 HISTORY — PX: ESOPHAGOGASTRODUODENOSCOPY: SHX5428

## 2019-09-26 HISTORY — PX: POLYPECTOMY: SHX5525

## 2019-09-26 SURGERY — EGD (ESOPHAGOGASTRODUODENOSCOPY)
Anesthesia: Moderate Sedation

## 2019-09-26 MED ORDER — MIDAZOLAM HCL 5 MG/5ML IJ SOLN
INTRAMUSCULAR | Status: AC
  Filled 2019-09-26: qty 10

## 2019-09-26 MED ORDER — LIDOCAINE VISCOUS HCL 2 % MT SOLN
OROMUCOSAL | Status: DC | PRN
  Administered 2019-09-26: 1 via OROMUCOSAL

## 2019-09-26 MED ORDER — FENTANYL CITRATE (PF) 100 MCG/2ML IJ SOLN
INTRAMUSCULAR | Status: AC
  Filled 2019-09-26: qty 2

## 2019-09-26 MED ORDER — MIDAZOLAM HCL 5 MG/5ML IJ SOLN
INTRAMUSCULAR | Status: DC | PRN
  Administered 2019-09-26 (×3): 1 mg via INTRAVENOUS

## 2019-09-26 MED ORDER — FENTANYL CITRATE (PF) 100 MCG/2ML IJ SOLN
INTRAMUSCULAR | Status: DC | PRN
  Administered 2019-09-26 (×2): 25 ug via INTRAVENOUS

## 2019-09-26 MED ORDER — LIDOCAINE VISCOUS HCL 2 % MT SOLN
OROMUCOSAL | Status: AC
  Filled 2019-09-26: qty 15

## 2019-09-26 MED ORDER — SODIUM CHLORIDE 0.9 % IV SOLN: INTRAVENOUS | Status: DC

## 2019-09-26 MED ORDER — MEPERIDINE HCL 50 MG/ML IJ SOLN
INTRAMUSCULAR | Status: AC
  Filled 2019-09-26: qty 1

## 2019-09-26 NOTE — H&P (Signed)
Shawna [name] is an 79 y.o. female.   Chief Complaint: Patient is here for esophagogastroduodenoscopy. HPI: Patient is 79 year old African-American female with multiple medical problems also has history of GI bleed and colonic polyps.  She was seen in the office for scheduled visit on 09/10/2019 with intractable bloating felt to be due to small intestinal bacterial overgrowth.  She has a history of small bowel ischemic injury few years ago.  Review of her records revealed that she had EGD in January 2018 by Dr. Havery Moros and found to have a small duodenal polyp.  He was not able to remove it.  Patient has not had any follow-up exam. Her flatulence improved with metronidazole.  She denies nausea vomiting melena or rectal bleeding.  She says she had chest pain last night without diaphoresis or palpitation and took nitroglycerin with relief.  She is planning to follow-up with Dr. Bronson Ing her cardiologist. Last aspirin dose was yesterday. He had scheduled hemodialysis yesterday.  Past Medical History:  Diagnosis Date  . Acute on chronic respiratory failure with hypoxia ([place]) 10/10/2016  . Anxiety   . Arthritis   . AVM (arteriovenous malformation) of colon   . CAD (coronary artery disease)    a. s/p CABG in 2013 b. most recent DES to D1 in 10/2016 with cath showing patent LIMA-LAD, SVG-RI, SVG-Mrg and SVG-RCA  c. NST in 02/2018 showing scar with no significant ischemia  . Carotid artery disease ([place])    a. 73-41% LICA, 03/3789   . Chronic bronchitis ([place])   . Chronic diastolic CHF (congestive heart failure) ([employer])    a. 02/2012 Echo EF 60-65%, nl wall motion, Gr 1 DD, mod MR  . Colon cancer ([place]) 1992  . Esophageal stricture   . ESRD on hemodialysis ([place])    ESRD due to HTN, started dialysis 2011 and gets HD at [facility] with Dr Hinda Lenis on MWF schedule.  Access is LUA AVF as of Sept 2014.   Marland Kitchen GERD (gastroesophageal reflux disease)   . High cholesterol 12/2011  . History of blood transfusion  07/2011; 12/2011; 01/2012 X 2; 04/2012  . History of gout   . History of lower GI bleeding   . Hypertension   . Iron deficiency anemia   . Mitral regurgitation    a. Moderate by echo, 02/2012  . Myocardial infarction ([place])   . Ovarian cancer ([place]) 1992  . Pneumonia ~ 2009  . PUD (peptic ulcer disease)   . TIA (transient ischemic attack)     Past Surgical History:  Procedure Laterality Date  . A/V SHUNTOGRAM Left 03/19/2019   Procedure: A/V SHUNTOGRAM;  Surgeon: Katha Cabal, MD;  Location: [place] [facility] LAB;  Service: Cardiovascular;  Laterality: Left;  . ABDOMINAL HYSTERECTOMY  1992  . APPENDECTOMY  06/1990  . AV FISTULA PLACEMENT  07/2009   left upper arm  . AV FISTULA PLACEMENT Right 09/06/2016   Procedure: RIGHT FOREARM ARTERIOVENOUS (AV) GRAFT;  Surgeon: Elam Dutch, MD;  Location: [facility] OR;  Service: Vascular;  Laterality: Right;  . AV FISTULA PLACEMENT N/A 02/24/2017   Procedure: INSERTION OF ARTERIOVENOUS (AV) GORE-TEX GRAFT ARM (BRACHIAL AXILLARY);  Surgeon: Katha Cabal, MD;  Location: [facility] ORS;  Service: Vascular;  Laterality: N/A;  . [place] Right 09/06/2016   Procedure: REMOVAL OF Right Arm ARTERIOVENOUS GORETEX GRAFT and Vein Patch angioplasty of brachial artery;  Surgeon: Angelia Mould, MD;  Location: [place];  Service: Vascular;  Laterality: Right;  . COLON RESECTION  [place]    . COLONOSCOPY N/A 03/09/2019   Procedure: COLONOSCOPY;  Surgeon: Rogene Houston, MD;  [facility];  Service: Endoscopy;  Laterality: N/A;  . CORONARY ANGIOPLASTY WITH STENT PLACEMENT  12/15/11   "2"  . CORONARY ANGIOPLASTY WITH STENT PLACEMENT  y/2013   "1; makes total of 3" (05/02/2012)  . CORONARY ARTERY BYPASS GRAFT  06/13/2012   Procedure: CORONARY ARTERY BYPASS GRAFTING (CABG);  Surgeon: Grace Isaac, MD;  Location: [place];  Service: Open Heart Surgery;  Laterality: N/A;  cabg x four;  using left internal mammary artery, and left leg  greater saphenous vein harvested endoscopically  . CORONARY STENT INTERVENTION N/A 10/13/2016   Procedure: Coronary Stent Intervention;  Surgeon: Troy Sine, MD;  Location: [place] [facility] LAB;  Service: Cardiovascular;  Laterality: N/A;  . DIALYSIS/PERMA CATHETER REMOVAL N/A 04/18/2017   Procedure: DIALYSIS/PERMA CATHETER REMOVAL;  Surgeon: Katha Cabal, MD;  Location: [place] [facility];  Service: Cardiovascular;  Laterality: N/A;  . DILATION AND CURETTAGE OF UTERUS    . ESOPHAGOGASTRODUODENOSCOPY  01/20/2012   Procedure: ESOPHAGOGASTRODUODENOSCOPY (EGD);  Surgeon: Ladene Artist, MD,FACG;  Location: [facility] ENDOSCOPY;  Service: Endoscopy;  Laterality: N/A;  . ESOPHAGOGASTRODUODENOSCOPY N/A 03/26/2013   Procedure: ESOPHAGOGASTRODUODENOSCOPY (EGD);  Surgeon: Irene Shipper, MD;  Location: [facility] ENDOSCOPY;  Service: Endoscopy;  Laterality: N/A;  . ESOPHAGOGASTRODUODENOSCOPY N/A 04/30/2015   Procedure: ESOPHAGOGASTRODUODENOSCOPY (EGD);  Surgeon: Rogene Houston, MD;  [facility];  Service: Endoscopy;  Laterality: N/A;  1pm - moved to 10/20 @ 1:10  . ESOPHAGOGASTRODUODENOSCOPY N/A 07/29/2016   Procedure: ESOPHAGOGASTRODUODENOSCOPY (EGD);  Surgeon: Manus Gunning, MD;  Location: [place];  Service: Gastroenterology;  Laterality: N/A;  enteroscopy  . GIVENS CAPSULE STUDY N/A 03/07/2019   Procedure: GIVENS CAPSULE STUDY;  Surgeon: Rogene Houston, MD;  [facility];  Service: Endoscopy;  Laterality: N/A;  7:30  . INTRAOPERATIVE TRANSESOPHAGEAL ECHOCARDIOGRAM  06/13/2012   Procedure: INTRAOPERATIVE TRANSESOPHAGEAL ECHOCARDIOGRAM;  Surgeon: Grace Isaac, MD;  Location: [place];  Service: Open Heart Surgery;  Laterality: N/A;  . IR GENERIC HISTORICAL  07/26/2016   IR FLUORO GUIDE CV LINE RIGHT 07/26/2016 Greggory Keen, MD [facility]  . IR GENERIC HISTORICAL  07/26/2016   IR US GUIDE VASC ACCESS RIGHT 07/26/2016 Greggory Keen, MD [facility]  . IR GENERIC HISTORICAL   08/02/2016   IR US GUIDE VASC ACCESS RIGHT 08/02/2016 Greggory Keen, MD [facility]  . IR GENERIC HISTORICAL  08/02/2016   IR FLUORO GUIDE CV LINE RIGHT 08/02/2016 Greggory Keen, MD [facility]  . IR RADIOLOGY PERIPHERAL GUIDED IV START  03/28/2017  . IR US GUIDE VASC ACCESS RIGHT  03/28/2017  . LEFT HEART CATH AND CORONARY ANGIOGRAPHY N/A 09/20/2016   Procedure: Left Heart Cath and Coronary Angiography;  Surgeon: [name], MD;  Location: [place] [facility];  Service: Cardiovascular;  Laterality: N/A;  . LEFT HEART CATH AND CORS/GRAFTS ANGIOGRAPHY N/A 10/13/2016   Procedure: Left Heart Cath and Cors/Grafts Angiography;  Surgeon: Troy Sine, MD;  Location: [place] [facility] LAB;  Service: Cardiovascular;  Laterality: N/A;  . LEFT HEART CATH AND CORS/GRAFTS ANGIOGRAPHY N/A 07/13/2018   Procedure: LEFT HEART CATH AND CORS/GRAFTS ANGIOGRAPHY;  Surgeon: Martinique, Peter M, MD;  Location: [place] [facility] LAB;  Service: Cardiovascular;  Laterality: N/A;  . LEFT HEART CATHETERIZATION WITH CORONARY ANGIOGRAM N/A 12/15/2011   Procedure: LEFT HEART CATHETERIZATION WITH CORONARY ANGIOGRAM;  Surgeon: Burnell Blanks, MD;  Location: [facility] CATH LAB;  Service: Cardiovascular;  Laterality: N/A;  . LEFT HEART CATHETERIZATION WITH CORONARY ANGIOGRAM N/A 01/10/2012   Procedure: LEFT HEART CATHETERIZATION WITH CORONARY ANGIOGRAM;  Surgeon: Peter M Martinique, MD;  Location: [facility] CATH LAB;  Service: Cardiovascular;  Laterality: N/A;  . LEFT HEART CATHETERIZATION WITH CORONARY ANGIOGRAM N/A 06/08/2012   Procedure: LEFT HEART CATHETERIZATION WITH CORONARY ANGIOGRAM;  Surgeon: Burnell Blanks, MD;  Location: [facility] CATH LAB;  Service: Cardiovascular;  Laterality: N/A;  . LEFT HEART CATHETERIZATION WITH CORONARY/GRAFT ANGIOGRAM N/A 12/10/2013   Procedure: LEFT HEART CATHETERIZATION WITH Beatrix Fetters;  Surgeon: [name], MD;  Location: [facility] CATH LAB;  Service: Cardiovascular;  Laterality: N/A;  . OVARY SURGERY      ovarian cancer  . POLYPECTOMY  03/09/2019   Procedure: POLYPECTOMY;  Surgeon: Rogene Houston, MD;  [facility];  Service: Endoscopy;;  cecal   . REVISION OF ARTERIOVENOUS GORETEX GRAFT N/A 02/24/2017   Procedure: REVISION OF ARTERIOVENOUS GORETEX GRAFT (RESECTION);  Surgeon: Katha Cabal, MD;  Location: [facility] ORS;  Service: Vascular;  Laterality: N/A;  [place] N/A 10/15/2013   Procedure: Fistulogram;  Surgeon: Serafina Mitchell, MD;  Location: [facility] CATH LAB;  Service: Cardiovascular;  Laterality: N/A;  . THROMBECTOMY / ARTERIOVENOUS GRAFT REVISION  2011   left upper arm  . TUBAL LIGATION  1980's  . UPPER EXTREMITY ANGIOGRAPHY Bilateral 12/06/2016   Procedure: Upper Extremity Angiography;  Surgeon: Katha Cabal, MD;  Location: [place] [facility];  Service: Cardiovascular;  Laterality: Bilateral;  . UPPER EXTREMITY INTERVENTION Left 06/06/2017   Procedure: UPPER EXTREMITY INTERVENTION;  Surgeon: Katha Cabal, MD;  Location: [place] [facility];  Service: Cardiovascular;  Laterality: Left;    Family History  Problem Relation Age of Onset  . Heart disease Mother        Heart Disease before age 11  . Hyperlipidemia Mother   . Hypertension Mother   . Diabetes Mother   . Heart attack Mother   . Heart disease Father        Heart Disease before age 3  . Hyperlipidemia Father   . Hypertension Father   . Diabetes Father   . Diabetes Sister   . Hypertension Sister   . Diabetes Brother   . Hyperlipidemia Brother   . Heart attack Brother   . Hypertension Sister   . Heart attack Brother   . Colon cancer Child 102  . Other Other        noncontributory for early CAD  . Esophageal cancer Neg Hx   . Liver disease Neg Hx   . Kidney disease Neg Hx   . Colon polyps Neg Hx    Social History:  reports that she has never smoked. She has never used smokeless tobacco. She reports that she does not drink alcohol or use drugs.  Allergies:  Allergies  Allergen  Reactions  . Aspirin Other (See Comments)    High Doses Mess up her stomach; "makes my bowels have blood in them". Takes 81 mg EC Aspirin   . Penicillins Other (See Comments)    SYNCOPE? , "makes me real weak when I take it; like I'll pass out"  Has patient had a PCN reaction causing immediate rash, facial/tongue/throat swelling, SOB or lightheadedness with hypotension: Yes Has patient had a PCN reaction causing severe rash involving mucus membranes or skin necrosis: no Has patient had a PCN reaction that required hospitalization  no Has patient had a PCN reaction occurring within the last 10 years: no If all of the above  . Amlodipine Swelling  . Bactrim [Sulfamethoxazole-Trimethoprim] Rash  . Contrast Media [Iodinated Diagnostic Agents] Itching  . Iron Itching and Other (See Comments)    "they gave me iron in dialysis; had to give me Benadryl cause I had to have the iron" (05/02/2012)  . Nitrofurantoin Hives  . Tylenol [Acetaminophen] Itching and Other (See Comments)    Makes her feet on fire per pt  . Gabapentin Other (See Comments)    Unknown reaction  . Hydralazine Itching  . Dexilant [Dexlansoprazole] Other (See Comments)    Upset stomach  . Levaquin [Levofloxacin In D5w] Rash  . Morphine And Related Itching and Other (See Comments)    Itching in feet  . Plavix [Clopidogrel Bisulfate] Rash  . Protonix [Pantoprazole Sodium] Rash  . Venofer [Ferric Oxide] Itching and Other (See Comments)    Patient reports using Benadryl prior to doses as [place]    Medications Prior to Admission  Medication Sig Dispense Refill  . ALPRAZolam (XANAX) 0.5 MG tablet Take 0.5 mg by mouth at bedtime as needed for sleep.     Marland Kitchen amiodarone (PACERONE) 200 MG tablet Take 1 tablet (200 mg total) by mouth daily. (Patient taking differently: Take 100 mg by mouth daily. ) 90 tablet 1  . aspirin EC 81 MG tablet Take 1 tablet (81 mg total) by mouth daily. Restart on 03/13/19 (Patient taking differently:  Take 81 mg by mouth daily. )    . cinacalcet (SENSIPAR) 30 MG tablet Take 30 mg by mouth daily.    . cloNIDine (CATAPRES) 0.1 MG tablet Take 1 tablet (0.1 mg total) by mouth 2 (two) times daily. (Patient taking differently: Take 0.1 mg by mouth daily as needed (for blood pressure levels). ) 60 tablet 2  . doxycycline (VIBRA-TABS) 100 MG tablet Take 100 mg by mouth 2 (two) times daily.    . fluticasone (FLONASE) 50 MCG/ACT nasal spray Place 1 spray at bedtime as needed into both nostrils for allergies.     Marland Kitchen gabapentin (NEURONTIN) 100 MG capsule Take 100 mg by mouth 2 (two) times daily.     . isosorbide mononitrate (IMDUR) 60 MG 24 hr tablet Take 1 tablet (60 mg total) by mouth 2 (two) times daily. 45 tablet 3  . lidocaine-prilocaine (EMLA) cream Apply 1 application topically every Monday, Wednesday, and Friday. Prior to dialysis    . loratadine (CLARITIN) 10 MG tablet Take 1 tablet (10 mg total) by mouth daily as needed for allergies. 30 tablet 0  . metroNIDAZOLE (FLAGYL) 250 MG tablet Take 1 tablet (250 mg total) by mouth 3 (three) times daily. 30 tablet 0  . multivitamin (RENA-VIT) TABS tablet Take 1 tablet by mouth daily.    . nitroGLYCERIN (NITROSTAT) 0.4 MG SL tablet DISSOLVE ONE TABLET UNDER THE TONGUE EVERY 5 MINUTES AS NEEDED FOR CHEST PAIN.  DO NOT EXCEED A TOTAL OF 3 DOSES IN 15 MINUTES (Patient taking differently: Place 0.4 mg under the tongue every 5 (five) minutes as needed for chest pain. ) 25 tablet 2  . omeprazole (PRILOSEC) 40 MG capsule Take 1 capsule (40 mg total) by mouth daily before breakfast. 30 capsule 5  . ondansetron (ZOFRAN-ODT) 4 MG disintegrating tablet Take 4 mg by mouth every 8 (eight) hours as needed for nausea or vomiting.     . sevelamer carbonate (RENVELA) 800 MG tablet Take 2,400-3,600 mg by  mouth 3 (three) times daily with meals.     . simvastatin (ZOCOR) 20 MG tablet TAKE 1 TABLET BY MOUTH ONCE DAILY AT BEDTIME (Patient taking differently: Take 20 mg by mouth at  bedtime. ) 90 tablet 2  . triamcinolone cream (KENALOG) 0.1 % Apply 1 application topically daily as needed (Dry skin/ itching).     . carvedilol (COREG) 12.5 MG tablet Take 1 tablet (12.5 mg total) by mouth 2 (two) times daily with a meal. Additional refills per PCP (Patient not taking: Reported on 09/23/2019) 180 tablet 1  . felodipine (PLENDIL) 2.5 MG 24 hr tablet Take 1 tablet (2.5 mg total) by mouth daily. (Patient not taking: Reported on 09/23/2019) 30 tablet 1  . polyethylene glycol (MIRALAX / GLYCOLAX) 17 g packet Take 17 g by mouth daily as needed for mild constipation. (Patient not taking: Reported on 09/23/2019) 14 each 0    No results found for this or any previous visit (from the past 48 hour(s)). No results found.  Review of Systems  Blood pressure (!) 185/63, pulse 76, temperature 98.2 F (36.8 C), temperature source Oral, resp. rate 15, height 5\' 1"  (1.549 m), SpO2 100 %. Physical Exam  Constitutional: She appears well-developed and well-nourished.  HENT:  Mouth/Throat: Oropharynx is clear and moist.  Eyes: Conjunctivae are normal. No scleral icterus.  Neck: No thyromegaly present.  Cardiovascular: Normal rate and regular rhythm.  Murmur heard. Faint systolic murmur at aortic area.  Respiratory: Effort normal and breath sounds normal.  Midsternal scar.  GI:  Abdomen is full but soft and nontender with organomegaly or masses.  Musculoskeletal:        General: No edema.  Lymphadenopathy:    She has no cervical adenopathy.  Neurological: She is alert.  Skin: Skin is warm and dry.     Assessment/Plan History of duodenal polyp. Esophagogastroduodenoscopy with duodenal polypectomy.  Hildred Laser, MD 09/26/2019, 10:46 AM

## 2019-09-26 NOTE — Discharge Instructions (Signed)
Resume aspirin on 09/27/2019. Resume other medications as before. Resume usual diet. No driving for 24 hours. Physician will call with biopsy results. Office visit in August 2021 as planned     Upper Endoscopy, Adult, Care After This sheet gives you information about how to care for yourself after your procedure. Your health care provider may also give you more specific instructions. If you have problems or questions, contact your health care provider. What can I expect after the procedure? After the procedure, it is common to have:  A sore throat.  Mild stomach pain or discomfort.  Bloating.  Nausea. Follow these instructions at home:   Follow instructions from your health care provider about what to eat or drink after your procedure.  Return to your normal activities as told by your health care provider. Ask your health care provider what activities are safe for you.  Take over-the-counter and prescription medicines only as told by your health care provider.  Do not drive for 24 hours if you were given a sedative during your procedure.  Keep all follow-up visits as told by your health care provider. This is important. Contact a health care provider if you have:  A sore throat that lasts longer than one day.  Trouble swallowing. Get help right away if:  You vomit blood or your vomit looks like coffee grounds.  You have: ? A fever. ? Bloody, black, or tarry stools. ? A severe sore throat or you cannot swallow. ? Difficulty breathing. ? Severe pain in your chest or abdomen. Summary  After the procedure, it is common to have a sore throat, mild stomach discomfort, bloating, and nausea.  Do not drive for 24 hours if you were given a sedative during the procedure.  Follow instructions from your health care provider about what to eat or drink after your procedure.  Return to your normal activities as told by your health care provider. This information is not  intended to replace advice given to you by your health care provider. Make sure you discuss any questions you have with your health care provider. Document Revised: 12/19/2017 Document Reviewed: 11/27/2017 Elsevier Patient Education  [place].

## 2019-09-26 NOTE — Op Note (Signed)
[facility] Patient Name: Shawna [name] Procedure Date: 09/26/2019 10:30 AM MRN: [id number] Date of Birth: 1940-10-28 Attending MD: Hildred Laser , MD CSN: [id number] Age: 79 Admit Type: Outpatient Procedure:                Upper GI endoscopy Indications:              duodenal polyp Providers:                Hildred Laser, MD, Janeece Riggers, RN, Aram Candela Referring MD:             [name], MD Medicines:                Lidocaine spray, Fentanyl 50 micrograms IV,                            Midazolam 3 mg IV Complications:            No immediate complications. Estimated Blood Loss:     Estimated blood loss was minimal. Procedure:                Pre-Anesthesia Assessment:                           - Prior to the procedure, a History and Physical                            was performed, and patient medications and                            allergies were reviewed. The patient's tolerance of                            previous anesthesia was also reviewed. The risks                            and benefits of the procedure and the sedation                            options and risks were discussed with the patient.                            All questions were answered, and informed consent                            was obtained. Prior Anticoagulants: The patient has                            taken no previous anticoagulant or antiplatelet                            agents except for aspirin. ASA Grade Assessment:                            III - A patient with severe systemic disease. After  reviewing the risks and benefits, the patient was                            deemed in satisfactory condition to undergo the                            procedure.                           After obtaining informed consent, the endoscope was                            passed under direct vision. Throughout the                            procedure, the patient's  blood pressure, pulse, and                            oxygen saturations were monitored continuously. The                            [id number]-[id number] ([id number]) scope was introduced through the                            mouth, and advanced to the third part of duodenum.                            The upper GI endoscopy was accomplished without                            difficulty. The patient tolerated the procedure                            well. Scope In: 10:59:58 AM Scope Out: 11:09:53 AM Total Procedure Duration: 0 hours 9 minutes 55 seconds  Findings:      The examined esophagus was normal.      The Z-line was regular and was found 34 cm from the incisors.      A 3 cm hiatal hernia was present.      The entire examined stomach was normal.      The duodenal bulb was normal.      A single diminutive sessile polyp was found in the second portion of the       duodenum. Biopsies were taken with a cold forceps for histology.      The third portion of the duodenum was normal. Impression:               - Normal esophagus.                           - Z-line regular, 34 cm from the incisors.                           - 3 cm hiatal hernia.                           -  Normal stomach.                           - Normal duodenal bulb.                           - A single duodenal polyp. Biopsied.                           - Normal third portion of the duodenum. No polyp                            found. Moderate Sedation:      Moderate (conscious) sedation was administered by the endoscopy nurse       and supervised by the endoscopist. The following parameters were       monitored: oxygen saturation, heart rate, blood pressure, CO2       capnography and response to care. Total physician intraservice time was       14 minutes. Recommendation:           - Patient has a contact number available for                            emergencies. The signs and symptoms of potential                             delayed complications were discussed with the                            patient. Return to normal activities tomorrow.                            Written discharge instructions were provided to the                            patient.                           - Resume previous diet today.                           - Continue present medications.                           - No aspirin, ibuprofen, naproxen, or other                            non-steroidal anti-inflammatory drugs for 1 day.                           - Await pathology results. Procedure Code(s):        --- Professional ---                           [phone], Esophagogastroduodenoscopy, flexible,  transoral; with biopsy, single or multiple                           G0500, Moderate sedation services provided by the                            same physician or other qualified health care                            professional performing a gastrointestinal                            endoscopic service that sedation supports,                            requiring the presence of an independent trained                            observer to assist in the monitoring of the                            patient's level of consciousness and physiological                            status; initial 15 minutes of intra-service time;                            patient age 34 years or older (additional time may                            be reported with [phone], as appropriate) Diagnosis Code(s):        --- Professional ---                           K44.9, Diaphragmatic hernia without obstruction or                            gangrene                           K31.7, Polyp of stomach and duodenum CPT copyright 2019 [employer]. All rights reserved. The codes documented in this report are preliminary and upon coder review may  be revised to meet current compliance requirements. Hildred Laser, MD Hildred Laser, MD 09/26/2019 11:21:27 AM This report has been signed electronically. Number of Addenda: 0

## 2019-09-27 ENCOUNTER — Emergency Department (HOSPITAL_COMMUNITY)
Admission: EM | Admit: 2019-09-27 | Discharge: 2019-09-27 | Disposition: A | Discharge status: Home / Self Care | Payer: Medicare HMO | Attending: Emergency Medicine | Admitting: Emergency Medicine

## 2019-09-27 ENCOUNTER — Other Ambulatory Visit: Payer: Self-pay

## 2019-09-27 ENCOUNTER — Encounter (HOSPITAL_COMMUNITY): Payer: Self-pay | Admitting: Emergency Medicine

## 2019-09-27 DIAGNOSIS — N186 End stage renal disease: Secondary | ICD-10-CM | POA: Diagnosis not present

## 2019-09-27 DIAGNOSIS — Z79899 Other long term (current) drug therapy: Secondary | ICD-10-CM | POA: Insufficient documentation

## 2019-09-27 DIAGNOSIS — Z7982 Long term (current) use of aspirin: Secondary | ICD-10-CM | POA: Diagnosis not present

## 2019-09-27 DIAGNOSIS — Z9114 Patient's other noncompliance with medication regimen: Secondary | ICD-10-CM | POA: Diagnosis not present

## 2019-09-27 DIAGNOSIS — Z992 Dependence on renal dialysis: Secondary | ICD-10-CM | POA: Insufficient documentation

## 2019-09-27 DIAGNOSIS — R000 Tachycardia, unspecified: Secondary | ICD-10-CM | POA: Diagnosis not present

## 2019-09-27 DIAGNOSIS — I25119 Atherosclerotic heart disease of native coronary artery with unspecified angina pectoris: Secondary | ICD-10-CM | POA: Insufficient documentation

## 2019-09-27 DIAGNOSIS — Z8543 Personal history of malignant neoplasm of ovary: Secondary | ICD-10-CM | POA: Diagnosis not present

## 2019-09-27 DIAGNOSIS — Z85038 Personal history of other malignant neoplasm of large intestine: Secondary | ICD-10-CM | POA: Insufficient documentation

## 2019-09-27 DIAGNOSIS — I132 Hypertensive heart and chronic kidney disease with heart failure and with stage 5 chronic kidney disease, or end stage renal disease: Secondary | ICD-10-CM | POA: Diagnosis not present

## 2019-09-27 DIAGNOSIS — R531 Weakness: Secondary | ICD-10-CM | POA: Insufficient documentation

## 2019-09-27 DIAGNOSIS — I5032 Chronic diastolic (congestive) heart failure: Secondary | ICD-10-CM | POA: Diagnosis not present

## 2019-09-27 LAB — CBC WITH DIFFERENTIAL/PLATELET
Abs Immature Granulocytes: 0.03 10*3/uL (ref 0.00–0.07)
Basophils Absolute: 0.1 10*3/uL (ref 0.0–0.1)
Basophils Relative: 1 %
Eosinophils Absolute: 0.2 10*3/uL (ref 0.0–0.5)
Eosinophils Relative: 3 %
HCT: 34.7 % — ABNORMAL LOW (ref 36.0–46.0)
Hemoglobin: 10.5 g/dL — ABNORMAL LOW (ref 12.0–15.0)
Immature Granulocytes: 0 %
Lymphocytes Relative: 10 %
Lymphs Abs: 0.7 10*3/uL (ref 0.7–4.0)
MCH: 35.1 pg — ABNORMAL HIGH (ref 26.0–34.0)
MCHC: 30.3 g/dL (ref 30.0–36.0)
MCV: 116.1 fL — ABNORMAL HIGH (ref 80.0–100.0)
Monocytes Absolute: 0.6 10*3/uL (ref 0.1–1.0)
Monocytes Relative: 9 %
Neutro Abs: 5.5 10*3/uL (ref 1.7–7.7)
Neutrophils Relative %: 77 %
Platelets: 291 10*3/uL (ref 150–400)
RBC: 2.99 MIL/uL — ABNORMAL LOW (ref 3.87–5.11)
RDW: 17.4 % — ABNORMAL HIGH (ref 11.5–15.5)
WBC: 7.1 10*3/uL (ref 4.0–10.5)
nRBC: 0 % (ref 0.0–0.2)

## 2019-09-27 LAB — BASIC METABOLIC PANEL
Anion gap: 12 (ref 5–15)
BUN: 22 mg/dL (ref 8–23)
CO2: 28 mmol/L (ref 22–32)
Calcium: 8.2 mg/dL — ABNORMAL LOW (ref 8.9–10.3)
Chloride: 97 mmol/L — ABNORMAL LOW (ref 98–111)
Creatinine, Ser: 5.03 mg/dL — ABNORMAL HIGH (ref 0.44–1.00)
GFR calc Af Amer: 9 mL/min — ABNORMAL LOW (ref >60)
GFR calc non Af Amer: 8 mL/min — ABNORMAL LOW (ref >60)
Glucose, Bld: 86 mg/dL (ref 70–99)
Potassium: 4.1 mmol/L (ref 3.5–5.1)
Sodium: 137 mmol/L (ref 135–145)

## 2019-09-27 LAB — TROPONIN I (HIGH SENSITIVITY): Troponin I (High Sensitivity): 27 ng/L — ABNORMAL HIGH (ref <18)

## 2019-09-27 MED ORDER — CARVEDILOL 12.5 MG PO TABS
12.5000 mg | ORAL_TABLET | Freq: Once | ORAL | Status: AC
  Administered 2019-09-27: 12.5 mg via ORAL
  Filled 2019-09-27: qty 1

## 2019-09-27 NOTE — ED Provider Notes (Signed)
[facility] EMERGENCY DEPARTMENT Provider Note   CSN: [id number] Arrival date & time: 09/27/19  1703     History Chief Complaint  Patient presents with  . Weakness    Shawna [name] is a 79 y.o. female with a history as outlined below, most significant for CAD with CABG in 2013, CHF, end-stage renal disease on dialysis, who completed today's treatment around 4 PM and who underwent an upper endoscopy yesterday for biopsy of a known duodenal polyp presenting with an approximate 30-minute episode after dialysis today of feeling weak and generalized "not well".  She ate a small snack and her symptoms improved.  She denies history of diabetes or hypoglycemia.  She also denies chest pain, shortness of breath, near syncope, nausea, vomiting or abdominal pain, also no headache or focal weakness..  She did note having an elevated heart rate which frequently occurs after dialysis sessions.  She states she had a full treatment today and obtained her dry weight.  At this time, she states her symptoms have resolved and is ready to go home.  The history is provided by the patient and the spouse.       Past Medical History:  Diagnosis Date  . Acute on chronic respiratory failure with hypoxia ([place]) 10/10/2016  . Anxiety   . Arthritis   . AVM (arteriovenous malformation) of colon   . CAD (coronary artery disease)    a. s/p CABG in 2013 b. most recent DES to D1 in 10/2016 with cath showing patent LIMA-LAD, SVG-RI, SVG-Mrg and SVG-RCA  c. NST in 02/2018 showing scar with no significant ischemia  . Carotid artery disease ([place])    a. 89-38% LICA, 07/173   . Chronic bronchitis ([place])   . Chronic diastolic CHF (congestive heart failure) ([place])    a. 02/2012 Echo EF 60-65%, nl wall motion, Gr 1 DD, mod MR  . Colon cancer ([place]) 1992  . Esophageal stricture   . ESRD on hemodialysis ([place])    ESRD due to HTN, started dialysis 2011 and gets HD [facility] with Dr Hinda Lenis on MWF schedule.  Access is LUA AVF as  of Sept 2014.   Marland Kitchen GERD (gastroesophageal reflux disease)   . High cholesterol 12/2011  . History of blood transfusion 07/2011; 12/2011; 01/2012 X 2; 04/2012  . History of gout   . History of lower GI bleeding   . Hypertension   . Iron deficiency anemia   . Mitral regurgitation    a. Moderate by echo, 02/2012  . Myocardial infarction ([place])   . Ovarian cancer ([place]) 1992  . Pneumonia ~ 2009  . PUD (peptic ulcer disease)   . TIA (transient ischemic attack)     Patient Active Problem List   Diagnosis Date Noted  . Adenomatous duodenal polyp 09/10/2019  . History of GI bleed 09/10/2019  . Angina pectoris ([place]) 06/05/2019  . Chest pain 06/03/2019  . Rectal bleeding 05/14/2019  . Small intestinal bacterial overgrowth 05/14/2019  . Iron deficiency anemia 04/02/2019  . GI bleed 03/08/2019  . Gastrointestinal hemorrhage with melena 03/06/2019  . Acute respiratory failure with hypoxia ([place]) 12/25/2018  . Elevated troponin 12/14/2018  . Chest pain at rest 07/13/2018  . Hand steal syndrome ([place]) 08/01/2017  . Anemia 07/14/2017  . Coronary artery disease of bypass graft of native heart with stable angina pectoris ([place]) 06/05/2017  . Mesenteric ischemia ([place])   . Diverticulitis   . SBO (small bowel obstruction) ([place]) 03/22/2017  . Enteritis   .  Complication of vascular access for dialysis 03/19/2017  . Preoperative clearance 01/25/2017  . Symptomatic anemia 10/24/2016  . H/O non-ST elevation myocardial infarction (NSTEMI) 10/24/2016  . Fluid overload 10/10/2016  . Complication from renal dialysis device 10/10/2016  . Non-ST elevation MI (NSTEMI) ([place])   . Encounter for fitting and adjustment of vascular catheter   . Heme positive stool   . Demand ischemia ([place]) 07/27/2016  . Hypertensive emergency 07/08/2016  . Acute on chronic respiratory failure with hypoxia ([place])   . Cardiac arrest ([place])   . Palliative care encounter   . Goals of care, counseling/discussion   . Hypertensive crisis  without congestive heart failure 05/09/2016  . Acute pulmonary edema ([place]) 04/06/2016  . Acute respiratory failure ([place]) 04/06/2016  . Hypertensive crisis 01/27/2016  . History of colon cancer 01/27/2016  . History of ovarian cancer 01/27/2016  . Hypertensive urgency 01/27/2016  . AF (paroxysmal atrial fibrillation) ([place]) 10/14/2015  . Coronary angioplasty status 10/14/2015  . Malignant neoplasm of right ovary ([place]) 10/14/2015  . Narrow complex tachycardia ([place]) 09/08/2015  . SVT (supraventricular tachycardia) ([place]) 09/08/2015  . Influenza A 08/30/2015  . Acute on chronic diastolic CHF (congestive heart failure) ([place]) 05/04/2015  . Unstable angina ([place]) 05/03/2015  . Essential hypertension   . Pain in joint, lower leg 08/14/2014  . Dacryocystitis 05/29/2013  . Chronic diastolic CHF (congestive heart failure) ([place]) 03/22/2013  . GI bleeding 03/21/2013  . Acute blood loss anemia 03/21/2013  . Vaginal odor 03/12/2013  . Vaginal discharge 03/12/2013  . Occlusion and stenosis of carotid artery without mention of cerebral infarction 01/24/2013  . Hx of CABG 07/05/2012  . Carotid artery disease ([place]) 07/05/2012  . Mitral regurgitation 06/12/2012  . Pneumonia 06/09/2012  . Non-STEMI (non-ST elevated myocardial infarction) ([place]) 06/08/2012  . Ischemic chest pain ([place]) 03/01/2012  . 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 GERD (gastroesophageal reflux disease) 01/09/2012  . HLD (hyperlipidemia) 01/05/2012  . Atherosclerotic heart disease of native coronary artery without angina pectoris 12/16/2011  . ESRD on hemodialysis ([place]) 12/16/2011  . Anxiety disorder 05/04/2011  . Anemia in chronic kidney disease 04/29/2011  . Secondary hyperparathyroidism of renal origin ([place]) 04/29/2011  . End stage renal disease ([place]) 04/29/2011  . Gout 04/29/2011  . Hypertensive chronic kidney disease with stage 5 chronic kidney disease or end stage renal disease ([place])  04/29/2011    Past Surgical History:  Procedure Laterality Date  . A/V SHUNTOGRAM Left 03/19/2019   Procedure: A/V SHUNTOGRAM;  Surgeon: Katha Cabal, MD;  Location: [place] CV LAB;  Service: Cardiovascular;  Laterality: Left;  . ABDOMINAL HYSTERECTOMY  1992  . APPENDECTOMY  06/1990  . AV FISTULA PLACEMENT  07/2009   left upper arm  . AV FISTULA PLACEMENT Right 09/06/2016   Procedure: RIGHT FOREARM ARTERIOVENOUS (AV) GRAFT;  Surgeon: Elam Dutch, MD;  Location: [facility] OR;  Service: Vascular;  Laterality: Right;  . AV FISTULA PLACEMENT N/A 02/24/2017   Procedure: INSERTION OF ARTERIOVENOUS (AV) GORE-TEX GRAFT ARM (BRACHIAL AXILLARY);  Surgeon: Katha Cabal, MD;  Location: [facility] ORS;  Service: Vascular;  Laterality: N/A;  . [place] Right 09/06/2016   Procedure: REMOVAL OF Right Arm ARTERIOVENOUS GORETEX GRAFT and Vein Patch angioplasty of brachial artery;  Surgeon: Angelia Mould, MD;  Location: [place];  Service: Vascular;  Laterality: Right;  . COLON RESECTION  1992  . COLON SURGERY    . COLONOSCOPY N/A 03/09/2019   Procedure: COLONOSCOPY;  Surgeon:  Rogene Houston, MD;  [facility];  Service: Endoscopy;  Laterality: N/A;  . CORONARY ANGIOPLASTY WITH STENT PLACEMENT  12/15/11   "2"  . CORONARY ANGIOPLASTY WITH STENT PLACEMENT  y/2013   "1; makes total of 3" (05/02/2012)  . CORONARY ARTERY BYPASS GRAFT  06/13/2012   Procedure: CORONARY ARTERY BYPASS GRAFTING (CABG);  Surgeon: Grace Isaac, MD;  Location: [place];  Service: Open Heart Surgery;  Laterality: N/A;  cabg x four;  using left internal mammary artery, and left leg greater saphenous vein harvested endoscopically  . CORONARY STENT INTERVENTION N/A 10/13/2016   Procedure: Coronary Stent Intervention;  Surgeon: Troy Sine, MD;  Location: [place] CV LAB;  Service: Cardiovascular;  Laterality: N/A;  . DIALYSIS/PERMA CATHETER REMOVAL N/A 04/18/2017   Procedure: DIALYSIS/PERMA CATHETER REMOVAL;   Surgeon: Katha Cabal, MD;  Location: [place] CV LAB;  Service: Cardiovascular;  Laterality: N/A;  . DILATION AND CURETTAGE OF UTERUS    . ESOPHAGOGASTRODUODENOSCOPY  01/20/2012   Procedure: ESOPHAGOGASTRODUODENOSCOPY (EGD);  Surgeon: Ladene Artist, MD,FACG;  Location: [facility] ENDOSCOPY;  Service: Endoscopy;  Laterality: N/A;  . ESOPHAGOGASTRODUODENOSCOPY N/A 03/26/2013   Procedure: ESOPHAGOGASTRODUODENOSCOPY (EGD);  Surgeon: Irene Shipper, MD;  Location: [facility] ENDOSCOPY;  Service: Endoscopy;  Laterality: N/A;  . ESOPHAGOGASTRODUODENOSCOPY N/A 04/30/2015   Procedure: ESOPHAGOGASTRODUODENOSCOPY (EGD);  Surgeon: Rogene Houston, MD;  [facility];  Service: Endoscopy;  Laterality: N/A;  1pm - moved to 10/20 @ 1:10  . ESOPHAGOGASTRODUODENOSCOPY N/A 07/29/2016   Procedure: ESOPHAGOGASTRODUODENOSCOPY (EGD);  Surgeon: Manus Gunning, MD;  Location: [place];  Service: Gastroenterology;  Laterality: N/A;  enteroscopy  . GIVENS CAPSULE STUDY N/A 03/07/2019   Procedure: GIVENS CAPSULE STUDY;  Surgeon: Rogene Houston, MD;  [facility];  Service: Endoscopy;  Laterality: N/A;  7:30  . INTRAOPERATIVE TRANSESOPHAGEAL ECHOCARDIOGRAM  06/13/2012   Procedure: INTRAOPERATIVE TRANSESOPHAGEAL ECHOCARDIOGRAM;  Surgeon: Grace Isaac, MD;  Location: [place];  Service: Open Heart Surgery;  Laterality: N/A;  . IR GENERIC HISTORICAL  07/26/2016   IR FLUORO GUIDE CV LINE RIGHT 07/26/2016 Greggory Keen, MD [facility]INTER[facility]  . IR GENERIC HISTORICAL  07/26/2016   IR US GUIDE VASC ACCESS RIGHT 07/26/2016 Greggory Keen, MD [facility]  . IR GENERIC HISTORICAL  08/02/2016   IR US GUIDE VASC ACCESS RIGHT 08/02/2016 Greggory Keen, MD [facility]  . IR GENERIC HISTORICAL  08/02/2016   IR FLUORO GUIDE CV LINE RIGHT 08/02/2016 Greggory Keen, MD [facility]  . IR RADIOLOGY PERIPHERAL GUIDED IV START  03/28/2017  . IR US GUIDE VASC ACCESS RIGHT  03/28/2017  . LEFT HEART CATH AND CORONARY ANGIOGRAPHY  N/A 09/20/2016   Procedure: Left Heart Cath and Coronary Angiography;  Surgeon: [name], MD;  Location: [place] CV LAB;  Service: Cardiovascular;  Laterality: N/A;  . LEFT HEART CATH AND CORS/GRAFTS ANGIOGRAPHY N/A 10/13/2016   Procedure: Left Heart Cath and Cors/Grafts Angiography;  Surgeon: Troy Sine, MD;  Location: [place] CV LAB;  Service: Cardiovascular;  Laterality: N/A;  . LEFT HEART CATH AND CORS/GRAFTS ANGIOGRAPHY N/A 07/13/2018   Procedure: LEFT HEART CATH AND CORS/GRAFTS ANGIOGRAPHY;  Surgeon: Martinique, Peter M, MD;  Location: [place] [facility] LAB;  Service: Cardiovascular;  Laterality: N/A;  . LEFT HEART CATHETERIZATION WITH CORONARY ANGIOGRAM N/A 12/15/2011   Procedure: LEFT HEART CATHETERIZATION WITH CORONARY ANGIOGRAM;  Surgeon: Burnell Blanks, MD;  Location: [facility] CATH LAB;  Service: Cardiovascular;  Laterality: N/A;  .  LEFT HEART CATHETERIZATION WITH CORONARY ANGIOGRAM N/A 01/10/2012   Procedure: LEFT HEART CATHETERIZATION WITH CORONARY ANGIOGRAM;  Surgeon: Peter M Martinique, MD;  Location: [facility] CATH LAB;  Service: Cardiovascular;  Laterality: N/A;  . LEFT HEART CATHETERIZATION WITH CORONARY ANGIOGRAM N/A 06/08/2012   Procedure: LEFT HEART CATHETERIZATION WITH CORONARY ANGIOGRAM;  Surgeon: Burnell Blanks, MD;  Location: [facility] CATH LAB;  Service: Cardiovascular;  Laterality: N/A;  . LEFT HEART CATHETERIZATION WITH CORONARY/GRAFT ANGIOGRAM N/A 12/10/2013   Procedure: LEFT HEART CATHETERIZATION WITH Beatrix Fetters;  Surgeon: [name], MD;  Location: [facility] CATH LAB;  Service: Cardiovascular;  Laterality: N/A;  . OVARY SURGERY     ovarian cancer  . POLYPECTOMY  03/09/2019   Procedure: POLYPECTOMY;  Surgeon: Rogene Houston, MD;  [facility];  Service: Endoscopy;;  cecal   . REVISION OF ARTERIOVENOUS GORETEX GRAFT N/A 02/24/2017   Procedure: REVISION OF ARTERIOVENOUS GORETEX GRAFT (RESECTION);  Surgeon: Katha Cabal, MD;  Location: [facility] ORS;   Service: Vascular;  Laterality: N/A;  [place] N/A 10/15/2013   Procedure: Fistulogram;  Surgeon: Serafina Mitchell, MD;  Location: [facility] CATH LAB;  Service: Cardiovascular;  Laterality: N/A;  . THROMBECTOMY / ARTERIOVENOUS GRAFT REVISION  2011   left upper arm  . TUBAL LIGATION  1980's  . UPPER EXTREMITY ANGIOGRAPHY Bilateral 12/06/2016   Procedure: Upper Extremity Angiography;  Surgeon: Katha Cabal, MD;  Location: [place] CV LAB;  Service: Cardiovascular;  Laterality: Bilateral;  . UPPER EXTREMITY INTERVENTION Left 06/06/2017   Procedure: UPPER EXTREMITY INTERVENTION;  Surgeon: Katha Cabal, MD;  Location: [place] CV LAB;  Service: Cardiovascular;  Laterality: Left;     OB History    Gravida  2   Para  2   Term      Preterm  2   AB      Living  2     SAB      TAB      Ectopic      Multiple      Live Births              Family History  Problem Relation Age of Onset  . Heart disease Mother        Heart Disease before age 11  . Hyperlipidemia Mother   . Hypertension Mother   . Diabetes Mother   . Heart attack Mother   . Heart disease Father        Heart Disease before age 77  . Hyperlipidemia Father   . Hypertension Father   . Diabetes Father   . Diabetes Sister   . Hypertension Sister   . Diabetes Brother   . Hyperlipidemia Brother   . Heart attack Brother   . Hypertension Sister   . Heart attack Brother   . Colon cancer Child 16  . Other Other        noncontributory for early CAD  . Esophageal cancer Neg Hx   . Liver disease Neg Hx   . Kidney disease Neg Hx   . Colon polyps Neg Hx     Social History   Tobacco Use  . Smoking status: Never Smoker  . Smokeless tobacco: Never Used  Substance Use Topics  . Alcohol use: No    Alcohol/week: 0.0 standard drinks  . Drug use: No    Home Medications Prior to Admission medications   Medication Sig Start Date End Date Taking? Authorizing Provider  ALPRAZolam Duanne Moron) 0.5 MG  tablet  Take 0.5 mg by mouth at bedtime as needed for sleep.  10/16/18  Yes [provider]  amiodarone (PACERONE) 200 MG tablet Take 1 tablet (200 mg total) by mouth daily. Patient taking differently: Take 100 mg by mouth daily.  02/19/19  Yes Herminio Commons, MD  aspirin EC 81 MG tablet Take 1 tablet (81 mg total) by mouth daily. Restart on 03/13/19 09/27/19  Yes Rehman, Mechele Dawley, MD  carvedilol (COREG) 12.5 MG tablet Take 1 tablet (12.5 mg total) by mouth 2 (two) times daily with a meal. Additional refills per PCP 03/13/19 09/27/19 Yes Herminio Commons, MD  cinacalcet (SENSIPAR) 30 MG tablet Take 30 mg by mouth daily.   Yes [provider]  cloNIDine (CATAPRES) 0.1 MG tablet Take 1 tablet (0.1 mg total) by mouth 2 (two) times daily. Patient taking differently: Take 0.1 mg by mouth daily as needed (for blood pressure levels).  02/01/19  Yes Daune Perch, NP  doxycycline (VIBRA-TABS) 100 MG tablet Take 100 mg by mouth 2 (two) times daily. 09/17/19  Yes [provider]  felodipine (PLENDIL) 2.5 MG 24 hr tablet Take 1 tablet (2.5 mg total) by mouth daily. 06/06/19  Yes Tat, Shanon Brow, MD  fluticasone (FLONASE) 50 MCG/ACT nasal spray Place 1 spray at bedtime as needed into both nostrils for allergies.    Yes [provider]  gabapentin (NEURONTIN) 100 MG capsule Take 100 mg by mouth 2 (two) times daily.  06/10/19  Yes [provider]  isosorbide mononitrate (IMDUR) 60 MG 24 hr tablet Take 1 tablet (60 mg total) by mouth 2 (two) times daily. 07/16/19 10/14/19 Yes Strader, Fransisco Hertz, PA-C  lidocaine-prilocaine (EMLA) cream Apply 1 application topically every Monday, Wednesday, and Friday. Prior to dialysis   Yes [provider]  loratadine (CLARITIN) 10 MG tablet Take 1 tablet (10 mg total) by mouth daily as needed for allergies. 07/14/18  Yes Hosie Poisson, MD  metroNIDAZOLE (FLAGYL) 250 MG tablet Take 1 tablet (250 mg total) by mouth 3 (three) times daily.  09/10/19  Yes Rehman, Mechele Dawley, MD  multivitamin (RENA-VIT) TABS tablet Take 1 tablet by mouth daily.   Yes [provider]  nitroGLYCERIN (NITROSTAT) 0.4 MG SL tablet DISSOLVE ONE TABLET UNDER THE TONGUE EVERY 5 MINUTES AS NEEDED FOR CHEST PAIN.  DO NOT EXCEED A TOTAL OF 3 DOSES IN 15 MINUTES Patient taking differently: Place 0.4 mg under the tongue every 5 (five) minutes as needed for chest pain.  09/04/19  Yes Herminio Commons, MD  omeprazole (PRILOSEC) 40 MG capsule Take 1 capsule (40 mg total) by mouth daily before breakfast. 09/10/19  Yes Rehman, Mechele Dawley, MD  ondansetron (ZOFRAN-ODT) 4 MG disintegrating tablet Take 4 mg by mouth every 8 (eight) hours as needed for nausea or vomiting.  03/05/19  Yes [provider]  sevelamer carbonate (RENVELA) 800 MG tablet Take 2,400-3,600 mg by mouth 3 (three) times daily with meals.  04/03/19  Yes [provider]  simvastatin (ZOCOR) 20 MG tablet TAKE 1 TABLET BY MOUTH ONCE DAILY AT BEDTIME Patient taking differently: Take 20 mg by mouth at bedtime.  07/22/19  Yes Herminio Commons, MD  triamcinolone cream (KENALOG) 0.1 % Apply 1 application topically daily as needed (Dry skin/ itching).  09/11/19  Yes [provider]    Allergies    Aspirin, Penicillins, Amlodipine, Bactrim [sulfamethoxazole-trimethoprim], Contrast media [iodinated diagnostic agents], Iron, Nitrofurantoin, Tylenol [acetaminophen], Gabapentin, Hydralazine, Dexilant [dexlansoprazole], Levaquin [levofloxacin in d5w], Morphine and related,  Plavix [clopidogrel bisulfate], Protonix [pantoprazole sodium], and Venofer [ferric oxide]  Review of Systems   Review of Systems  Constitutional: Negative for chills, diaphoresis and fever.  HENT: Negative for congestion.   Eyes: Negative.   Respiratory: Negative for chest tightness and shortness of breath.   Cardiovascular: Positive for palpitations. Negative for chest pain.  Gastrointestinal: Negative for abdominal  pain, nausea and vomiting.  Genitourinary: Negative.        Patient does not make urine  Musculoskeletal: Negative for arthralgias, joint swelling and neck pain.  Skin: Negative.  Negative for rash and wound.  Neurological: Positive for weakness. Negative for dizziness, syncope, speech difficulty, light-headedness, numbness and headaches.  Psychiatric/Behavioral: Negative.     Physical Exam Updated Vital Signs BP (!) 123/51   Pulse 67   Temp 98.4 F (36.9 C) (Oral)   Resp 17   Ht 5\' 1"  (1.549 m)   Wt 63.5 kg   SpO2 99%   BMI 26.45 kg/m   Physical Exam Vitals and nursing note reviewed.  Constitutional:      Appearance: She is well-developed.  HENT:     Head: Normocephalic and atraumatic.  Eyes:     Conjunctiva/sclera: Conjunctivae normal.  Cardiovascular:     Rate and Rhythm: Normal rate and regular rhythm.     Heart sounds: Normal heart sounds.  Pulmonary:     Effort: Pulmonary effort is normal.     Breath sounds: Normal breath sounds. No wheezing.  Abdominal:     General: Bowel sounds are normal.     Palpations: Abdomen is soft.     Tenderness: There is no abdominal tenderness.  Musculoskeletal:        General: Normal range of motion.     Cervical back: Normal range of motion.  Skin:    General: Skin is warm and dry.  Neurological:     Mental Status: She is alert.     ED Results / Procedures / Treatments   Labs (all labs ordered are listed, but only abnormal results are displayed) Labs Reviewed  CBC WITH DIFFERENTIAL/PLATELET - Abnormal; Notable for the following components:      Result Value   RBC 2.99 (*)    Hemoglobin 10.5 (*)    HCT 34.7 (*)    MCV 116.1 (*)    MCH 35.1 (*)    RDW 17.4 (*)    All other components within normal limits  BASIC METABOLIC PANEL - Abnormal; Notable for the following components:   Chloride 97 (*)    Creatinine, Ser 5.03 (*)    Calcium 8.2 (*)    GFR calc non Af Amer 8 (*)    GFR calc Af Amer 9 (*)    All other  components within normal limits  TROPONIN I (HIGH SENSITIVITY) - Abnormal; Notable for the following components:   Troponin I (High Sensitivity) 27 (*)    All other components within normal limits    EKG EKG Interpretation  Date/Time:  Friday September 27 2019 20:04:21 EDT Ventricular Rate:  127 PR Interval:    QRS Duration: 112 QT Interval:  356 QTC Calculation: 518 R Axis:   41 Text Interpretation: Junctional tachycardia Incomplete left bundle branch block Probable LVH with secondary repol abnrm Prolonged QT interval Baseline wander in lead(s) V3 Confirmed by Milton Ferguson [phone]) on 09/27/2019 8:26:11 PM   Radiology No results found.  Procedures Procedures (including critical care time)  Medications Ordered in ED Medications  carvedilol (COREG) tablet 12.5 mg (12.5 mg  Oral Given 09/27/19 2119)    ED Course  I have reviewed the triage vital signs and the nursing notes.  Pertinent labs & imaging results that were available during my care of the patient were reviewed by me and considered in my medical decision making (see chart for details).    MDM Rules/Calculators/A&P                     12:07 AM Pt initially refused all blood work, but was agreeable to obtaining ekg and orthostatics given she had tachycardia on exam. At time of her disposition, discussed tachycardia symptoms and our inability to determine the reason for this or the reason for her episode of weakness this afternoon and would need to have her sign out AMA if she would not allow a blood draw.  She then decided we could draw some blood.    However, she also mentioned that she not take her coreg this am, states takes it only when her blood pressure is elevated.  This could very well be the reason for her tachycardia today.  She was given her Coreg dose while awaiting lab results and her pulse rate very nicely responded to this medication.  Her labs were reviewed and are stable.  She felt like she was at her baseline  wellness with no complaints at time of discharge.  Discussed the importance of medication compliance.  Plan follow-up with her PCP.  She also states she has follow-up with her cardiologist in several weeks.   Final Clinical Impression(s) / ED Diagnoses Final diagnoses:  Generalized weakness  Tachycardia  History of medication noncompliance    Rx / DC Orders ED Discharge Orders    None       Landis Martins 09/28/19 0007    Milton Ferguson, MD 10/01/19 1027

## 2019-09-27 NOTE — ED Triage Notes (Signed)
Had endoscopy yesterday and dialysis today.  States she has been weak since getting home from dialysis.

## 2019-09-27 NOTE — ED Notes (Signed)
Patient completed orthostatic vitals, heart rate was 127 while lying down. As patient sat up and stood up heart increased 130-140. Patient allowed me to put her on cardiac monitor and complete EKG. EKG seen by Dr Roderic Palau. Patient also states when she walks she gets a little dizzy and short of breath. Patient states that she did not take her Coreg this morning.

## 2019-09-27 NOTE — Discharge Instructions (Addendum)
You may have felt bad today due to your procedure yesterday in addition to your dialysis today - this could have made you feel weak and tired.  However, your heart rate was elevated upon first arrival, probably because you missed taking your coreg today.  This medicine should be taken as instructed by your prescribing MD without missing any doses.

## 2019-09-27 NOTE — ED Notes (Signed)
PT IS REFUSING BLOOD WORK.

## 2019-09-30 ENCOUNTER — Telehealth: Payer: Self-pay

## 2019-09-30 ENCOUNTER — Telehealth: Payer: Self-pay | Admitting: Cardiovascular Disease

## 2019-09-30 LAB — SURGICAL PATHOLOGY

## 2019-09-30 NOTE — Telephone Encounter (Signed)
Pt states she has questions HQ:NETUYWS she was having over the weekend.  Please call  [phone]  Thanks renee

## 2019-09-30 NOTE — Telephone Encounter (Signed)
Patient reports that she has had low blood pressures over the weekend.  116 sys is too low for her she claims.Anything below 120 she feels bad.When talking to her she did tell me her HR was in the 40's.I told her that may be the culprit.She is going to track BP and HR. Her current dose of coreg is 12.5 mg BID

## 2019-09-30 NOTE — Telephone Encounter (Signed)
Patient called stating that she is at Dialysis and her BP continues to drop. States " I just don't feel well" states that she did take a Nitroglycerin this am.around 1000am.  Please call [phone]

## 2019-09-30 NOTE — Telephone Encounter (Signed)
I spoke with patient, she will monitor her BP,HR over next couple days.She is aware we may reduce her coreg dose

## 2019-09-30 NOTE — Telephone Encounter (Signed)
Addressed by CC.

## 2019-09-30 NOTE — Telephone Encounter (Signed)
I agree with you.  Please have her continue to monitor her heart rate.  If it stays in the 40 bpm range over the next 2 days, reduce carvedilol to 6.25 mg twice daily.

## 2019-10-14 ENCOUNTER — Encounter (HOSPITAL_COMMUNITY): Payer: Self-pay | Admitting: Family Medicine

## 2019-10-14 ENCOUNTER — Other Ambulatory Visit: Payer: Self-pay

## 2019-10-14 ENCOUNTER — Observation Stay (HOSPITAL_COMMUNITY)
Admission: AD | Admit: 2019-10-14 | Discharge: 2019-10-15 | Disposition: A | Discharge status: Home / Self Care | Payer: Medicare HMO | Source: Other Acute Inpatient Hospital | Attending: Family Medicine | Admitting: Family Medicine

## 2019-10-14 ENCOUNTER — Observation Stay (HOSPITAL_COMMUNITY): Payer: Medicare HMO

## 2019-10-14 DIAGNOSIS — Z886 Allergy status to analgesic agent status: Secondary | ICD-10-CM | POA: Diagnosis not present

## 2019-10-14 DIAGNOSIS — Z880 Allergy status to penicillin: Secondary | ICD-10-CM | POA: Insufficient documentation

## 2019-10-14 DIAGNOSIS — I252 Old myocardial infarction: Secondary | ICD-10-CM | POA: Insufficient documentation

## 2019-10-14 DIAGNOSIS — Z20822 Contact with and (suspected) exposure to covid-19: Secondary | ICD-10-CM | POA: Diagnosis not present

## 2019-10-14 DIAGNOSIS — Z8711 Personal history of peptic ulcer disease: Secondary | ICD-10-CM | POA: Insufficient documentation

## 2019-10-14 DIAGNOSIS — Z8543 Personal history of malignant neoplasm of ovary: Secondary | ICD-10-CM | POA: Diagnosis not present

## 2019-10-14 DIAGNOSIS — Z91041 Radiographic dye allergy status: Secondary | ICD-10-CM | POA: Insufficient documentation

## 2019-10-14 DIAGNOSIS — Z955 Presence of coronary angioplasty implant and graft: Secondary | ICD-10-CM | POA: Diagnosis not present

## 2019-10-14 DIAGNOSIS — Z888 Allergy status to other drugs, medicaments and biological substances status: Secondary | ICD-10-CM | POA: Insufficient documentation

## 2019-10-14 DIAGNOSIS — E8779 Other fluid overload: Secondary | ICD-10-CM | POA: Insufficient documentation

## 2019-10-14 DIAGNOSIS — Z85038 Personal history of other malignant neoplasm of large intestine: Secondary | ICD-10-CM | POA: Insufficient documentation

## 2019-10-14 DIAGNOSIS — Z992 Dependence on renal dialysis: Secondary | ICD-10-CM | POA: Diagnosis not present

## 2019-10-14 DIAGNOSIS — I25708 Atherosclerosis of coronary artery bypass graft(s), unspecified, with other forms of angina pectoris: Secondary | ICD-10-CM | POA: Insufficient documentation

## 2019-10-14 DIAGNOSIS — K219 Gastro-esophageal reflux disease without esophagitis: Secondary | ICD-10-CM | POA: Diagnosis not present

## 2019-10-14 DIAGNOSIS — Z8673 Personal history of transient ischemic attack (TIA), and cerebral infarction without residual deficits: Secondary | ICD-10-CM | POA: Insufficient documentation

## 2019-10-14 DIAGNOSIS — Z885 Allergy status to narcotic agent status: Secondary | ICD-10-CM | POA: Insufficient documentation

## 2019-10-14 DIAGNOSIS — J9601 Acute respiratory failure with hypoxia: Secondary | ICD-10-CM | POA: Diagnosis not present

## 2019-10-14 DIAGNOSIS — Z882 Allergy status to sulfonamides status: Secondary | ICD-10-CM | POA: Insufficient documentation

## 2019-10-14 DIAGNOSIS — M1990 Unspecified osteoarthritis, unspecified site: Secondary | ICD-10-CM | POA: Insufficient documentation

## 2019-10-14 DIAGNOSIS — I480 Paroxysmal atrial fibrillation: Secondary | ICD-10-CM | POA: Diagnosis not present

## 2019-10-14 DIAGNOSIS — Z7982 Long term (current) use of aspirin: Secondary | ICD-10-CM | POA: Insufficient documentation

## 2019-10-14 DIAGNOSIS — N186 End stage renal disease: Secondary | ICD-10-CM | POA: Insufficient documentation

## 2019-10-14 DIAGNOSIS — I2510 Atherosclerotic heart disease of native coronary artery without angina pectoris: Secondary | ICD-10-CM | POA: Diagnosis present

## 2019-10-14 DIAGNOSIS — J810 Acute pulmonary edema: Secondary | ICD-10-CM

## 2019-10-14 DIAGNOSIS — I132 Hypertensive heart and chronic kidney disease with heart failure and with stage 5 chronic kidney disease, or end stage renal disease: Secondary | ICD-10-CM | POA: Insufficient documentation

## 2019-10-14 DIAGNOSIS — J811 Chronic pulmonary edema: Secondary | ICD-10-CM | POA: Diagnosis present

## 2019-10-14 DIAGNOSIS — E7800 Pure hypercholesterolemia, unspecified: Secondary | ICD-10-CM | POA: Diagnosis not present

## 2019-10-14 DIAGNOSIS — Z881 Allergy status to other antibiotic agents status: Secondary | ICD-10-CM | POA: Diagnosis not present

## 2019-10-14 DIAGNOSIS — R079 Chest pain, unspecified: Secondary | ICD-10-CM | POA: Diagnosis not present

## 2019-10-14 DIAGNOSIS — Z79899 Other long term (current) drug therapy: Secondary | ICD-10-CM | POA: Insufficient documentation

## 2019-10-14 DIAGNOSIS — I120 Hypertensive chronic kidney disease with stage 5 chronic kidney disease or end stage renal disease: Secondary | ICD-10-CM | POA: Diagnosis present

## 2019-10-14 LAB — RENAL FUNCTION PANEL
Albumin: 3.3 g/dL — ABNORMAL LOW (ref 3.5–5.0)
Anion gap: 15 (ref 5–15)
BUN: 57 mg/dL — ABNORMAL HIGH (ref 8–23)
CO2: 25 mmol/L (ref 22–32)
Calcium: 8.6 mg/dL — ABNORMAL LOW (ref 8.9–10.3)
Chloride: 101 mmol/L (ref 98–111)
Creatinine, Ser: 10.84 mg/dL — ABNORMAL HIGH (ref 0.44–1.00)
GFR calc Af Amer: 3 mL/min — ABNORMAL LOW (ref >60)
GFR calc non Af Amer: 3 mL/min — ABNORMAL LOW (ref >60)
Glucose, Bld: 122 mg/dL — ABNORMAL HIGH (ref 70–99)
Phosphorus: 5.6 mg/dL — ABNORMAL HIGH (ref 2.5–4.6)
Potassium: 4.6 mmol/L (ref 3.5–5.1)
Sodium: 141 mmol/L (ref 135–145)

## 2019-10-14 LAB — GLUCOSE, CAPILLARY: Glucose-Capillary: 71 mg/dL (ref 70–99)

## 2019-10-14 LAB — MRSA PCR SCREENING: MRSA by PCR: NEGATIVE

## 2019-10-14 LAB — CBC
HCT: 29.9 % — ABNORMAL LOW (ref 36.0–46.0)
Hemoglobin: 9.1 g/dL — ABNORMAL LOW (ref 12.0–15.0)
MCH: 35 pg — ABNORMAL HIGH (ref 26.0–34.0)
MCHC: 30.4 g/dL (ref 30.0–36.0)
MCV: 115 fL — ABNORMAL HIGH (ref 80.0–100.0)
Platelets: 218 10*3/uL (ref 150–400)
RBC: 2.6 MIL/uL — ABNORMAL LOW (ref 3.87–5.11)
RDW: 17.6 % — ABNORMAL HIGH (ref 11.5–15.5)
WBC: 6.8 10*3/uL (ref 4.0–10.5)
nRBC: 0 % (ref 0.0–0.2)

## 2019-10-14 LAB — RESPIRATORY PANEL BY RT PCR (FLU A&B, COVID)
Influenza A by PCR: NEGATIVE
Influenza B by PCR: NEGATIVE
SARS Coronavirus 2 by RT PCR: NEGATIVE

## 2019-10-14 IMAGING — DX DG CHEST 1V PORT
1 series · 1 of 1 positions shown · non-contrast
Comparison: Single-view of the chest [DATE] and [DATE].

CLINICAL DATA: Chest pain and lower extremity swelling.

EXAM:
PORTABLE CHEST 1 VIEW

[chest ap]
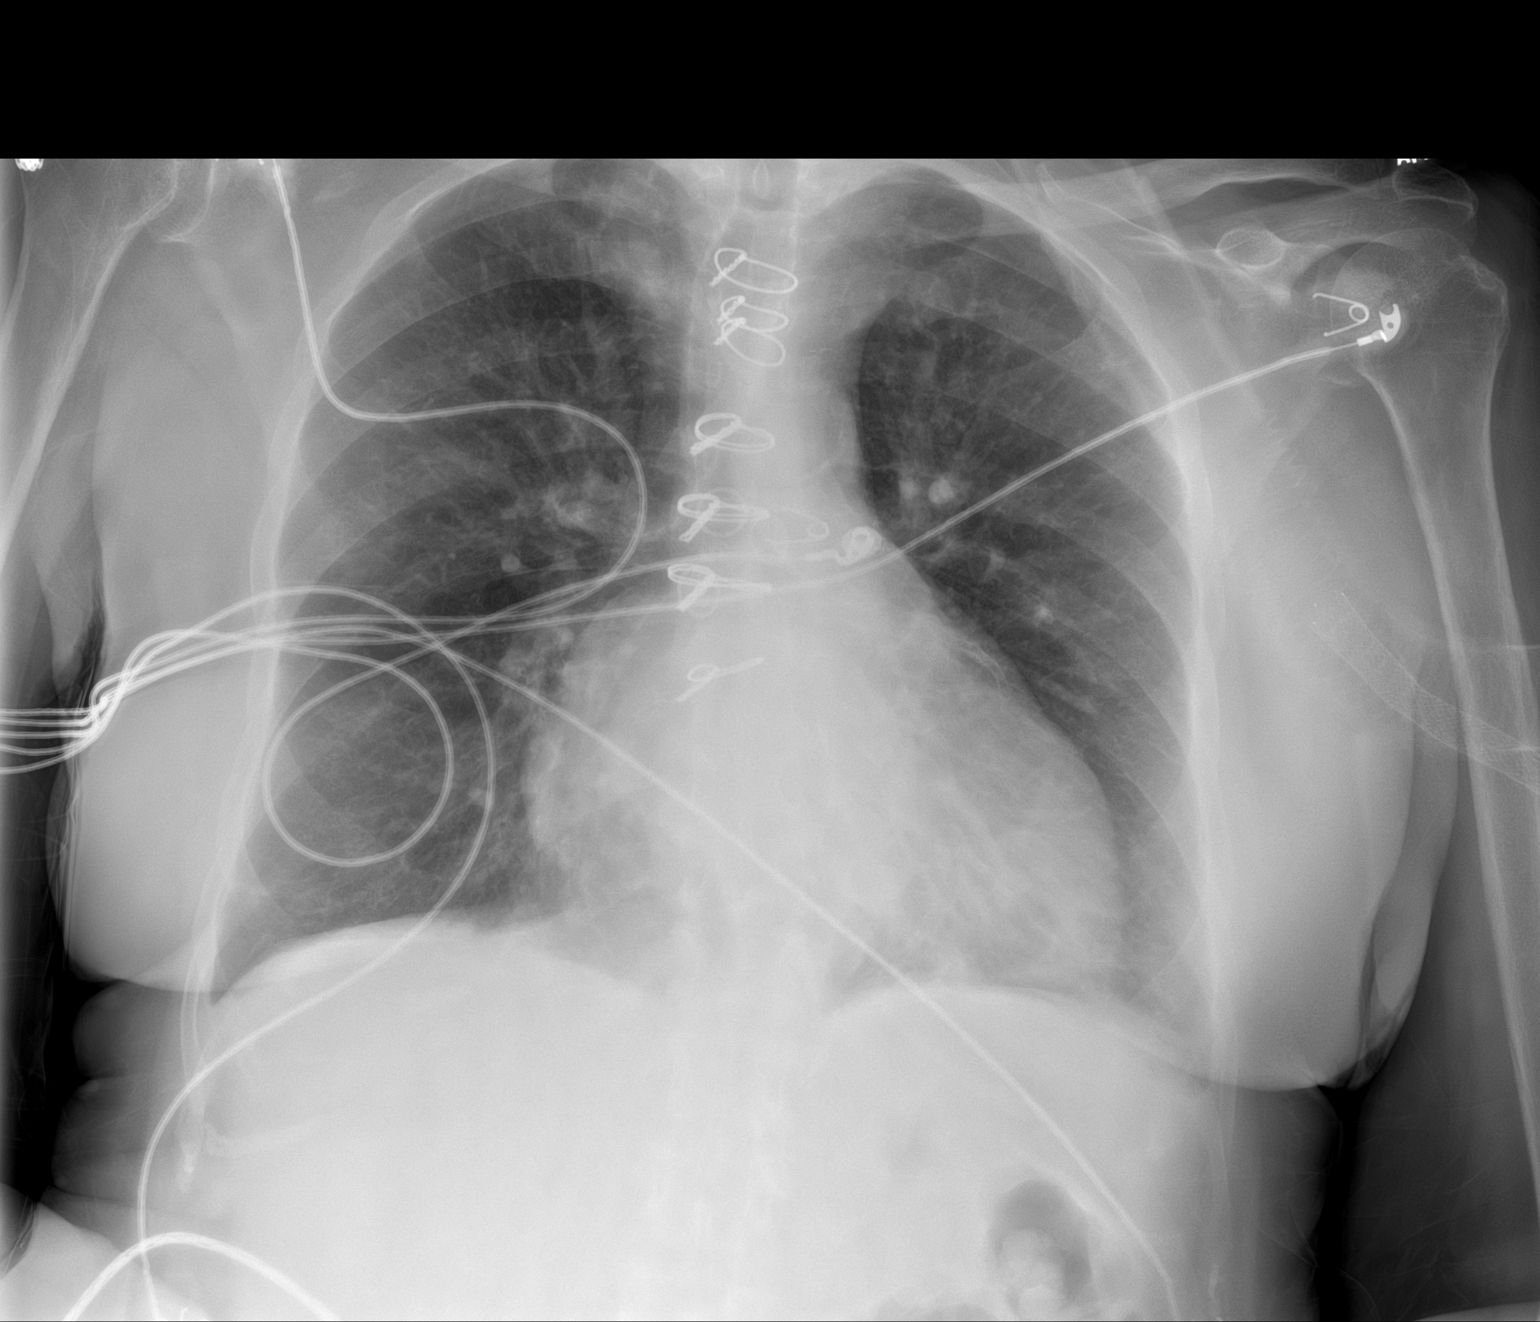

[1 of 1 positions shown; findings below may reference images not displayed]

FINDINGS: Pulmonary edema seen on the prior examinations has resolved. The
lungs are clear. There is cardiomegaly. No pneumothorax. Trace
pleural effusions noted. Vascular stent in the left upper arm is
identified.
IMPRESSION: Resolved pulmonary edema.

Cardiomegaly.

## 2019-10-14 MED ORDER — PENTAFLUOROPROP-TETRAFLUOROETH EX AERO: 1.0000 "application " | INHALATION_SPRAY | CUTANEOUS | Status: DC | PRN

## 2019-10-14 MED ORDER — DIPHENHYDRAMINE HCL 25 MG PO CAPS: 25.0000 mg | ORAL_CAPSULE | Freq: Four times a day (QID) | ORAL | Status: DC | PRN

## 2019-10-14 MED ORDER — LORATADINE 10 MG PO TABS: 10.0000 mg | ORAL_TABLET | Freq: Every day | ORAL | Status: DC | PRN

## 2019-10-14 MED ORDER — CHLORHEXIDINE GLUCONATE CLOTH 2 % EX PADS
6.0000 | MEDICATED_PAD | Freq: Every day | CUTANEOUS | Status: DC
  Administered 2019-10-14: 6 via TOPICAL

## 2019-10-14 MED ORDER — DOXERCALCIFEROL 4 MCG/2ML IV SOLN: 2.5000 ug | INTRAVENOUS | Status: DC

## 2019-10-14 MED ORDER — CHLORHEXIDINE GLUCONATE CLOTH 2 % EX PADS: 6.0000 | MEDICATED_PAD | Freq: Every day | CUTANEOUS | Status: DC

## 2019-10-14 MED ORDER — ISOSORBIDE MONONITRATE ER 60 MG PO TB24
60.0000 mg | ORAL_TABLET | Freq: Two times a day (BID) | ORAL | Status: DC
  Administered 2019-10-15: 60 mg via ORAL
  Filled 2019-10-14: qty 1

## 2019-10-14 MED ORDER — SEVELAMER CARBONATE 800 MG PO TABS: 1600.0000 mg | ORAL_TABLET | ORAL | Status: DC

## 2019-10-14 MED ORDER — FLUTICASONE PROPIONATE 50 MCG/ACT NA SUSP: 1.0000 | Freq: Every evening | NASAL | Status: DC | PRN

## 2019-10-14 MED ORDER — SODIUM CHLORIDE 0.9% FLUSH
3.0000 mL | Freq: Two times a day (BID) | INTRAVENOUS | Status: DC
  Administered 2019-10-14 – 2019-10-15 (×3): 3 mL via INTRAVENOUS

## 2019-10-14 MED ORDER — GABAPENTIN 100 MG PO CAPS
100.0000 mg | ORAL_CAPSULE | Freq: Two times a day (BID) | ORAL | Status: DC
  Administered 2019-10-14: 100 mg via ORAL
  Filled 2019-10-14 (×2): qty 1

## 2019-10-14 MED ORDER — AMIODARONE HCL 200 MG PO TABS
200.0000 mg | ORAL_TABLET | Freq: Every day | ORAL | Status: DC
  Administered 2019-10-14 – 2019-10-15 (×2): 200 mg via ORAL
  Filled 2019-10-14 (×2): qty 1

## 2019-10-14 MED ORDER — ONDANSETRON HCL 4 MG/2ML IJ SOLN: 4.0000 mg | Freq: Four times a day (QID) | INTRAMUSCULAR | Status: DC | PRN

## 2019-10-14 MED ORDER — SEVELAMER CARBONATE 800 MG PO TABS
2400.0000 mg | ORAL_TABLET | Freq: Three times a day (TID) | ORAL | Status: DC
  Administered 2019-10-14 – 2019-10-15 (×2): 2400 mg via ORAL
  Filled 2019-10-14 (×2): qty 3

## 2019-10-14 MED ORDER — HEPARIN SODIUM (PORCINE) 5000 UNIT/ML IJ SOLN
5000.0000 [IU] | Freq: Three times a day (TID) | INTRAMUSCULAR | Status: DC
  Administered 2019-10-14 (×2): 5000 [IU] via SUBCUTANEOUS
  Filled 2019-10-14 (×2): qty 1

## 2019-10-14 MED ORDER — ASPIRIN EC 81 MG PO TBEC
81.0000 mg | DELAYED_RELEASE_TABLET | Freq: Every day | ORAL | Status: DC
  Administered 2019-10-14 – 2019-10-15 (×2): 81 mg via ORAL
  Filled 2019-10-14 (×2): qty 1

## 2019-10-14 MED ORDER — ORAL CARE MOUTH RINSE
15.0000 mL | Freq: Two times a day (BID) | OROMUCOSAL | Status: DC
  Administered 2019-10-14 – 2019-10-15 (×2): 15 mL via OROMUCOSAL

## 2019-10-14 MED ORDER — SODIUM CHLORIDE 0.9 % IV SOLN: 100.0000 mL | INTRAVENOUS | Status: DC | PRN

## 2019-10-14 MED ORDER — SODIUM CHLORIDE 0.9% FLUSH: 3.0000 mL | INTRAVENOUS | Status: DC | PRN

## 2019-10-14 MED ORDER — DIPHENHYDRAMINE HCL 25 MG PO CAPS
25.0000 mg | ORAL_CAPSULE | Freq: Once | ORAL | Status: AC
  Administered 2019-10-14: 25 mg via ORAL
  Filled 2019-10-14: qty 1

## 2019-10-14 MED ORDER — RENA-VITE PO TABS
1.0000 | ORAL_TABLET | Freq: Every day | ORAL | Status: DC
  Administered 2019-10-14 – 2019-10-15 (×2): 1 via ORAL
  Filled 2019-10-14 (×2): qty 1

## 2019-10-14 MED ORDER — ALPRAZOLAM 0.5 MG PO TABS
0.5000 mg | ORAL_TABLET | Freq: Every evening | ORAL | Status: DC | PRN
  Administered 2019-10-14: 0.5 mg via ORAL
  Filled 2019-10-14: qty 1

## 2019-10-14 MED ORDER — LIDOCAINE HCL (PF) 1 % IJ SOLN: 5.0000 mL | INTRAMUSCULAR | Status: DC | PRN

## 2019-10-14 MED ORDER — SIMVASTATIN 20 MG PO TABS
20.0000 mg | ORAL_TABLET | Freq: Every day | ORAL | Status: DC
  Administered 2019-10-14: 20 mg via ORAL
  Filled 2019-10-14: qty 1

## 2019-10-14 MED ORDER — LIDOCAINE-PRILOCAINE 2.5-2.5 % EX CREA: 1.0000 "application " | TOPICAL_CREAM | CUTANEOUS | Status: DC | PRN

## 2019-10-14 MED ORDER — SODIUM CHLORIDE 0.9 % IV SOLN: 250.0000 mL | INTRAVENOUS | Status: DC | PRN

## 2019-10-14 MED ORDER — ONDANSETRON HCL 4 MG PO TABS: 4.0000 mg | ORAL_TABLET | Freq: Four times a day (QID) | ORAL | Status: DC | PRN

## 2019-10-14 MED ORDER — BISACODYL 5 MG PO TBEC: 5.0000 mg | DELAYED_RELEASE_TABLET | Freq: Every day | ORAL | Status: DC | PRN

## 2019-10-14 NOTE — H&P (Signed)
History and Physical  Elgin MHD:[id number] DOB: [birth year] DOA: 10/14/2019  PCP: [name], MD  Patient coming from: Direct admission from [facility] emergency department  I have personally briefly reviewed patient's old medical records in [facility]  Chief Complaint: chest pain   HPI: Shawna [name] is a 79 y.o. female with medical history significant for end-stage renal disease on hemodialysis Monday Wednesday Friday, GERD, hypertension, coronary artery disease and other medical history detailed below reports that she was not able to complete her last dialysis session on Friday as she developed some hypotension requiring at the treatment be stopped early.  The patient says over the last several hours she has been having chest tightness and shortness of breath she also reports bilateral leg swelling.  She denies fever and chills.  She denies nausea vomiting and diarrhea.  She has had a recent hospitalization due to congestive heart failure.  She is a non-smoker.  She was evaluated at the ED at [facility] and they noted that her troponin was 0.06.  She had pulmonary edema on her chest x-ray.  She required BiPAP treatment due to hypoxia.  Her acute respiratory symptoms improved after the BiPAP was started.  [facility] does not have hemodialysis services available at that facility and requested that the patient be transferred to [facility] for hemodialysis services.  The patient needs an HD treatment today for pulmonary edema.  COVID-19 testing is still pending.  Review of Systems: As per HPI otherwise 10 point review of systems negative.    Past Medical History:  Diagnosis Date  . Acute on chronic respiratory failure with hypoxia ([place]) 10/10/2016  . Anxiety   . Arthritis   . AVM (arteriovenous malformation) of colon   . CAD (coronary artery disease)    a. s/p CABG in 2013 b. most recent DES to D1 in 10/2016 with cath showing patent LIMA-LAD,  SVG-RI, SVG-Mrg and SVG-RCA  c. NST in 02/2018 showing scar with no significant ischemia  . Carotid artery disease ([place])    a. 21-19% LICA, 10/1738   . Chronic bronchitis ([place])   . Chronic diastolic CHF (congestive heart failure) ([place])    a. 02/2012 Echo EF 60-65%, nl wall motion, Gr 1 DD, mod MR  . Colon cancer ([place]) 1992  . Esophageal stricture   . ESRD on hemodialysis ([place])    ESRD due to HTN, started dialysis 2011 and gets HD at [facility] with Dr Hinda Lenis on MWF schedule.  Access is LUA AVF as of Sept 2014.   Marland Kitchen GERD (gastroesophageal reflux disease)   . High cholesterol 12/2011  . History of blood transfusion 07/2011; 12/2011; 01/2012 X 2; 04/2012  . History of gout   . History of lower GI bleeding   . Hypertension   . Iron deficiency anemia   . Mitral regurgitation    a. Moderate by echo, 02/2012  . Myocardial infarction ([place])   . Ovarian cancer ([place]) 1992  . Pneumonia ~ 2009  . PUD (peptic ulcer disease)   . TIA (transient ischemic attack)     Past Surgical History:  Procedure Laterality Date  . A/V SHUNTOGRAM Left 03/19/2019   Procedure: A/V SHUNTOGRAM;  Surgeon: Katha Cabal, MD;  Location: [place] CV LAB;  Service: Cardiovascular;  Laterality: Left;  . ABDOMINAL HYSTERECTOMY  1992  . APPENDECTOMY  06/1990  . AV FISTULA PLACEMENT  07/2009   left upper arm  . AV FISTULA  PLACEMENT Right 09/06/2016   Procedure: RIGHT FOREARM ARTERIOVENOUS (AV) GRAFT;  Surgeon: Elam Dutch, MD;  Location: [facility] OR;  Service: Vascular;  Laterality: Right;  . AV FISTULA PLACEMENT N/A 02/24/2017   Procedure: INSERTION OF ARTERIOVENOUS (AV) GORE-TEX GRAFT ARM (BRACHIAL AXILLARY);  Surgeon: Katha Cabal, MD;  Location: [facility] ORS;  Service: Vascular;  Laterality: N/A;  . [place] Right 09/06/2016   Procedure: REMOVAL OF Right Arm ARTERIOVENOUS GORETEX GRAFT and Vein Patch angioplasty of brachial artery;  Surgeon: Angelia Mould, MD;  Location: [place];  Service: Vascular;   Laterality: Right;  . BIOPSY  09/26/2019   Procedure: BIOPSY;  Surgeon: Rogene Houston, MD;  [facility];  Service: Endoscopy;;  . COLON RESECTION  1992  . COLON SURGERY    . COLONOSCOPY N/A 03/09/2019   Procedure: COLONOSCOPY;  Surgeon: Rogene Houston, MD;  [facility];  Service: Endoscopy;  Laterality: N/A;  . CORONARY ANGIOPLASTY WITH STENT PLACEMENT  12/15/11   "2"  . CORONARY ANGIOPLASTY WITH STENT PLACEMENT  y/2013   "1; makes total of 3" (05/02/2012)  . CORONARY ARTERY BYPASS GRAFT  06/13/2012   Procedure: CORONARY ARTERY BYPASS GRAFTING (CABG);  Surgeon: Grace Isaac, MD;  Location: [place];  Service: Open Heart Surgery;  Laterality: N/A;  cabg x four;  using left internal mammary artery, and left leg greater saphenous vein harvested endoscopically  . CORONARY STENT INTERVENTION N/A 10/13/2016   Procedure: Coronary Stent Intervention;  Surgeon: Troy Sine, MD;  Location: [place] CV LAB;  Service: Cardiovascular;  Laterality: N/A;  . DIALYSIS/PERMA CATHETER REMOVAL N/A 04/18/2017   Procedure: DIALYSIS/PERMA CATHETER REMOVAL;  Surgeon: Katha Cabal, MD;  Location: [place] CV LAB;  Service: Cardiovascular;  Laterality: N/A;  . DILATION AND CURETTAGE OF UTERUS    . ESOPHAGOGASTRODUODENOSCOPY  01/20/2012   Procedure: ESOPHAGOGASTRODUODENOSCOPY (EGD);  Surgeon: Ladene Artist, MD,FACG;  Location: [facility] ENDOSCOPY;  Service: Endoscopy;  Laterality: N/A;  . ESOPHAGOGASTRODUODENOSCOPY N/A 03/26/2013   Procedure: ESOPHAGOGASTRODUODENOSCOPY (EGD);  Surgeon: Irene Shipper, MD;  Location: [facility] ENDOSCOPY;  Service: Endoscopy;  Laterality: N/A;  . ESOPHAGOGASTRODUODENOSCOPY N/A 04/30/2015   Procedure: ESOPHAGOGASTRODUODENOSCOPY (EGD);  Surgeon: Rogene Houston, MD;  [facility];  Service: Endoscopy;  Laterality: N/A;  1pm - moved to 10/20 @ 1:10  . ESOPHAGOGASTRODUODENOSCOPY N/A 07/29/2016   Procedure: ESOPHAGOGASTRODUODENOSCOPY (EGD);  Surgeon: Manus Gunning, MD;  Location: [place];  Service: Gastroenterology;  Laterality: N/A;  enteroscopy  . ESOPHAGOGASTRODUODENOSCOPY N/A 09/26/2019   Procedure: ESOPHAGOGASTRODUODENOSCOPY (EGD);  Surgeon: Rogene Houston, MD;  [facility];  Service: Endoscopy;  Laterality: N/A;  1250  . GIVENS CAPSULE STUDY N/A 03/07/2019   Procedure: GIVENS CAPSULE STUDY;  Surgeon: Rogene Houston, MD;  [facility];  Service: Endoscopy;  Laterality: N/A;  7:30  . INTRAOPERATIVE TRANSESOPHAGEAL ECHOCARDIOGRAM  06/13/2012   Procedure: INTRAOPERATIVE TRANSESOPHAGEAL ECHOCARDIOGRAM;  Surgeon: Grace Isaac, MD;  Location: [place];  Service: Open Heart Surgery;  Laterality: N/A;  . IR GENERIC HISTORICAL  07/26/2016   IR FLUORO GUIDE CV LINE RIGHT 07/26/2016 Greggory Keen, MD [facility]  . IR GENERIC HISTORICAL  07/26/2016   IR US GUIDE VASC ACCESS RIGHT 07/26/2016 Greggory Keen, MD [facility]  . IR GENERIC HISTORICAL  08/02/2016   IR US GUIDE VASC ACCESS RIGHT 08/02/2016 Greggory Keen, MD [facility]  . IR GENERIC HISTORICAL  08/02/2016   IR FLUORO GUIDE CV LINE  RIGHT 08/02/2016 Greggory Keen, MD [facility]  . IR RADIOLOGY PERIPHERAL GUIDED IV START  03/28/2017  . IR US GUIDE VASC ACCESS RIGHT  03/28/2017  . LEFT HEART CATH AND CORONARY ANGIOGRAPHY N/A 09/20/2016   Procedure: Left Heart Cath and Coronary Angiography;  Surgeon: [name], MD;  Location: [place] CV LAB;  Service: Cardiovascular;  Laterality: N/A;  . LEFT HEART CATH AND CORS/GRAFTS ANGIOGRAPHY N/A 10/13/2016   Procedure: Left Heart Cath and Cors/Grafts Angiography;  Surgeon: Troy Sine, MD;  Location: [place] CV LAB;  Service: Cardiovascular;  Laterality: N/A;  . LEFT HEART CATH AND CORS/GRAFTS ANGIOGRAPHY N/A 07/13/2018   Procedure: LEFT HEART CATH AND CORS/GRAFTS ANGIOGRAPHY;  Surgeon: Martinique, Peter M, MD;  Location: [place] CV LAB;  Service: Cardiovascular;  Laterality: N/A;  . LEFT HEART CATHETERIZATION  WITH CORONARY ANGIOGRAM N/A 12/15/2011   Procedure: LEFT HEART CATHETERIZATION WITH CORONARY ANGIOGRAM;  Surgeon: Burnell Blanks, MD;  Location: [facility] CATH LAB;  Service: Cardiovascular;  Laterality: N/A;  . LEFT HEART CATHETERIZATION WITH CORONARY ANGIOGRAM N/A 01/10/2012   Procedure: LEFT HEART CATHETERIZATION WITH CORONARY ANGIOGRAM;  Surgeon: Peter M Martinique, MD;  Location: [facility] CATH LAB;  Service: Cardiovascular;  Laterality: N/A;  . LEFT HEART CATHETERIZATION WITH CORONARY ANGIOGRAM N/A 06/08/2012   Procedure: LEFT HEART CATHETERIZATION WITH CORONARY ANGIOGRAM;  Surgeon: Burnell Blanks, MD;  Location: [facility] CATH LAB;  Service: Cardiovascular;  Laterality: N/A;  . LEFT HEART CATHETERIZATION WITH CORONARY/GRAFT ANGIOGRAM N/A 12/10/2013   Procedure: LEFT HEART CATHETERIZATION WITH Beatrix Fetters;  Surgeon: [name], MD;  Location: [facility] CATH LAB;  Service: Cardiovascular;  Laterality: N/A;  . OVARY SURGERY     ovarian cancer  . POLYPECTOMY  03/09/2019   Procedure: POLYPECTOMY;  Surgeon: Rogene Houston, MD;  [facility];  Service: Endoscopy;;  cecal   . POLYPECTOMY N/A 09/26/2019   Procedure: DUODENAL POLYPECTOMY;  Surgeon: Rogene Houston, MD;  [facility];  Service: Endoscopy;  Laterality: N/A;  . REVISION OF ARTERIOVENOUS GORETEX GRAFT N/A 02/24/2017   Procedure: REVISION OF ARTERIOVENOUS GORETEX GRAFT (RESECTION);  Surgeon: Katha Cabal, MD;  Location: [facility] ORS;  Service: Vascular;  Laterality: N/A;  Earney Mallet N/A 10/15/2013   Procedure: Fistulogram;  Surgeon: Serafina Mitchell, MD;  Location: [facility] CATH LAB;  Service: Cardiovascular;  Laterality: N/A;  . THROMBECTOMY / ARTERIOVENOUS GRAFT REVISION  2011   left upper arm  . TUBAL LIGATION  1980's  . UPPER EXTREMITY ANGIOGRAPHY Bilateral 12/06/2016   Procedure: Upper Extremity Angiography;  Surgeon: Katha Cabal, MD;  Location: [place] CV LAB;  Service: Cardiovascular;  Laterality:  Bilateral;  . UPPER EXTREMITY INTERVENTION Left 06/06/2017   Procedure: UPPER EXTREMITY INTERVENTION;  Surgeon: Katha Cabal, MD;  Location: [place] CV LAB;  Service: Cardiovascular;  Laterality: Left;     reports that she has never smoked. She has never used smokeless tobacco. She reports that she does not drink alcohol or use drugs.  Allergies  Allergen Reactions  . Aspirin Other (See Comments)    High Doses Mess up her stomach; "makes my bowels have blood in them". Takes 81 mg EC Aspirin   . Penicillins Other (See Comments)    SYNCOPE? , "makes me real weak when I take it; like I'll pass out"  Has patient had a PCN reaction causing immediate rash, facial/tongue/throat swelling, SOB or lightheadedness with hypotension: Yes Has patient had a PCN reaction causing severe rash involving  mucus membranes or skin necrosis: no Has patient had a PCN reaction that required hospitalization no Has patient had a PCN reaction occurring within the last 10 years: no If all of the above  . Amlodipine Swelling  . Bactrim [Sulfamethoxazole-Trimethoprim] Rash  . Contrast Media [Iodinated Diagnostic Agents] Itching  . Iron Itching and Other (See Comments)    "they gave me iron in dialysis; had to give me Benadryl cause I had to have the iron" (05/02/2012)  . Nitrofurantoin Hives  . Tylenol [Acetaminophen] Itching and Other (See Comments)    Makes her feet on fire per pt  . Gabapentin Other (See Comments)    Unknown reaction  . Hydralazine Itching  . Dexilant [Dexlansoprazole] Other (See Comments)    Upset stomach  . Levaquin [Levofloxacin In D5w] Rash  . Morphine And Related Itching and Other (See Comments)    Itching in feet  . Plavix [Clopidogrel Bisulfate] Rash  . Protonix [Pantoprazole Sodium] Rash  . Venofer [Ferric Oxide] Itching and Other (See Comments)    Patient reports using Benadryl prior to doses as [facility]    Family History  Problem Relation Age of Onset  .  Heart disease Mother        Heart Disease before age 16  . Hyperlipidemia Mother   . Hypertension Mother   . Diabetes Mother   . Heart attack Mother   . Heart disease Father        Heart Disease before age 30  . Hyperlipidemia Father   . Hypertension Father   . Diabetes Father   . Diabetes Sister   . Hypertension Sister   . Diabetes Brother   . Hyperlipidemia Brother   . Heart attack Brother   . Hypertension Sister   . Heart attack Brother   . Colon cancer Child 97  . Other Other        noncontributory for early CAD  . Esophageal cancer Neg Hx   . Liver disease Neg Hx   . Kidney disease Neg Hx   . Colon polyps Neg Hx      Prior to Admission medications   Medication Sig Start Date End Date Taking? Authorizing Provider  ALPRAZolam Duanne Moron) 0.5 MG tablet Take 0.5 mg by mouth at bedtime as needed for sleep.  10/16/18   [provider]  amiodarone (PACERONE) 200 MG tablet Take 1 tablet (200 mg total) by mouth daily. Patient taking differently: Take 100 mg by mouth daily.  02/19/19   Herminio Commons, MD  aspirin EC 81 MG tablet Take 1 tablet (81 mg total) by mouth daily. Restart on 03/13/19 09/27/19   Rogene Houston, MD  carvedilol (COREG) 12.5 MG tablet Take 1 tablet (12.5 mg total) by mouth 2 (two) times daily with a meal. Additional refills per PCP 03/13/19 09/27/19  Herminio Commons, MD  cinacalcet (SENSIPAR) 30 MG tablet Take 30 mg by mouth daily.    [provider]  cloNIDine (CATAPRES) 0.1 MG tablet Take 1 tablet (0.1 mg total) by mouth 2 (two) times daily. Patient taking differently: Take 0.1 mg by mouth daily as needed (for blood pressure levels).  02/01/19   Daune Perch, NP  doxycycline (VIBRA-TABS) 100 MG tablet Take 100 mg by mouth 2 (two) times daily. 09/17/19   [provider]  felodipine (PLENDIL) 2.5 MG 24 hr tablet Take 1 tablet (2.5 mg total) by mouth daily. 06/06/19   Orson Eva, MD  fluticasone (FLONASE) 50 MCG/ACT nasal spray Place  1  spray at bedtime as needed into both nostrils for allergies.     [provider]  gabapentin (NEURONTIN) 100 MG capsule Take 100 mg by mouth 2 (two) times daily.  06/10/19   [provider]  isosorbide mononitrate (IMDUR) 60 MG 24 hr tablet Take 1 tablet (60 mg total) by mouth 2 (two) times daily. 07/16/19 10/14/19  Erma Heritage, PA-C  lidocaine-prilocaine (EMLA) cream Apply 1 application topically every Monday, Wednesday, and Friday. Prior to dialysis    [provider]  loratadine (CLARITIN) 10 MG tablet Take 1 tablet (10 mg total) by mouth daily as needed for allergies. 07/14/18   Hosie Poisson, MD  metroNIDAZOLE (FLAGYL) 250 MG tablet Take 1 tablet (250 mg total) by mouth 3 (three) times daily. 09/10/19   Rogene Houston, MD  multivitamin (RENA-VIT) TABS tablet Take 1 tablet by mouth daily.    [provider]  nitroGLYCERIN (NITROSTAT) 0.4 MG SL tablet DISSOLVE ONE TABLET UNDER THE TONGUE EVERY 5 MINUTES AS NEEDED FOR CHEST PAIN.  DO NOT EXCEED A TOTAL OF 3 DOSES IN 15 MINUTES Patient taking differently: Place 0.4 mg under the tongue every 5 (five) minutes as needed for chest pain.  09/04/19   Herminio Commons, MD  omeprazole (PRILOSEC) 40 MG capsule Take 1 capsule (40 mg total) by mouth daily before breakfast. 09/10/19   Rehman, Mechele Dawley, MD  ondansetron (ZOFRAN-ODT) 4 MG disintegrating tablet Take 4 mg by mouth every 8 (eight) hours as needed for nausea or vomiting.  03/05/19   [provider]  sevelamer carbonate (RENVELA) 800 MG tablet Take 2,400-3,600 mg by mouth 3 (three) times daily with meals.  04/03/19   [provider]  simvastatin (ZOCOR) 20 MG tablet TAKE 1 TABLET BY MOUTH ONCE DAILY AT BEDTIME Patient taking differently: Take 20 mg by mouth at bedtime.  07/22/19   Herminio Commons, MD  triamcinolone cream (KENALOG) 0.1 % Apply 1 application topically daily as needed (Dry skin/ itching).  09/11/19   [provider]     Physical Exam: Vitals:   10/14/19 0955 10/14/19 1030 10/14/19 1100 10/14/19 1130  BP: (!) 163/59 (!) 153/55 (!) 139/59 (!) 148/55  Pulse: 67 66 63 62  Resp:      Temp:      TempSrc:      SpO2: 100% 100% 100% 100%  Weight:      Height:         Constitutional: NAD, calm, comfortable.  Patient is on BiPAP therapy.  She is awake and alert and speaking full sentences. Eyes: PERRL, lids and conjunctivae normal ENMT: No gross abnormality seen. Neck: normal, supple, no masses, no thyromegaly Respiratory: BBS with crackles heard.  No accessory muscle use.  On bipap.  Cardiovascular: normal s1,s2 sounds, no murmurs / rubs / gallops. 1+ lower extremity edema bilateral. 2+ pedal pulses. No carotid bruits.  Abdomen: no tenderness, no masses palpated. No hepatosplenomegaly. Bowel sounds positive.  Musculoskeletal: no clubbing / cyanosis. No joint deformity upper and lower extremities. Good ROM, no contractures. Normal muscle tone.  Skin: no rashes, lesions, ulcers. No induration Neurologic: CN 2-12 grossly intact. Sensation intact, DTR normal. Strength 5/5 in all 4.  Psychiatric: Normal judgment and insight. Alert and oriented x 3. Normal mood.   Labs on Admission: I have personally reviewed following labs and imaging studies  CBC: No results for input(s): WBC, NEUTROABS, HGB, HCT, MCV, PLT in the last 168 hours. Basic Metabolic Panel: No results  for input(s): NA, K, CL, CO2, GLUCOSE, BUN, CREATININE, CALCIUM, MG, PHOS in the last 168 hours. GFR: Estimated Creatinine Clearance: 7.7 mL/min (A) (by C-G formula based on SCr of 5.03 mg/dL (H)). Liver Function Tests: No results for input(s): AST, ALT, ALKPHOS, BILITOT, PROT, ALBUMIN in the last 168 hours. No results for input(s): LIPASE, AMYLASE in the last 168 hours. No results for input(s): AMMONIA in the last 168 hours. Coagulation Profile: No results for input(s): INR, PROTIME in the last 168 hours. Cardiac Enzymes: No results for  input(s): CKTOTAL, CKMB, CKMBINDEX, TROPONINI in the last 168 hours. BNP (last 3 results) No results for input(s): PROBNP in the last 8760 hours. HbA1C: No results for input(s): HGBA1C in the last 72 hours. CBG: Recent Labs  Lab 10/14/19 0947  GLUCAP 71   Lipid Profile: No results for input(s): CHOL, HDL, LDLCALC, TRIG, CHOLHDL, LDLDIRECT in the last 72 hours. Thyroid Function Tests: No results for input(s): TSH, T4TOTAL, FREET4, T3FREE, THYROIDAB in the last 72 hours. Anemia Panel: No results for input(s): VITAMINB12, FOLATE, FERRITIN, TIBC, IRON, RETICCTPCT in the last 72 hours. Urine analysis:    Component Value Date/Time   COLORURINE YELLOW 12/16/2012 1919   APPEARANCEUR CLOUDY (A) 12/16/2012 1919   LABSPEC 1.009 12/16/2012 1919   PHURINE 7.5 12/16/2012 1919   GLUCOSEU NEGATIVE 12/16/2012 1919   HGBUR TRACE (A) 12/16/2012 1919   BILIRUBINUR NEGATIVE 12/16/2012 1919   KETONESUR NEGATIVE 12/16/2012 1919   PROTEINUR 100 (A) 12/16/2012 1919   UROBILINOGEN 0.2 12/16/2012 1919   NITRITE NEGATIVE 12/16/2012 1919   LEUKOCYTESUR SMALL (A) 12/16/2012 1919    Radiological Exams on Admission: DG CHEST PORT 1 VIEW  Result Date: 10/14/2019 CLINICAL DATA:  Chest pain and lower extremity swelling. EXAM: PORTABLE CHEST 1 VIEW COMPARISON:  Single-view of the chest 10/14/2019 and 10/13/2019. FINDINGS: Pulmonary edema seen on the prior examinations has resolved. The lungs are clear. There is cardiomegaly. No pneumothorax. Trace pleural effusions noted. Vascular stent in the left upper arm is identified. IMPRESSION: Resolved pulmonary edema. Cardiomegaly. Electronically Signed   By: Inge Rise M.D.   On: 10/14/2019 09:59   Assessment/Plan Principal Problem:   Pulmonary edema Active Problems:   End stage renal disease (HCC)   Coronary artery disease of bypass graft of native heart with stable angina pectoris (HCC)   AF (paroxysmal atrial fibrillation) (HCC)   Acute respiratory  failure with hypoxia (HCC)   Hypertensive chronic kidney disease with stage 5 chronic kidney disease or end stage renal disease (HCC)   Chest pain     1. Acute respiratory failure with hypoxia-secondary to volume overload pulmonary edema-patient is currently on BiPAP therapy.  She needs volume removal with hemodialysis which is being arranged.  Continue care in the stepdown ICU.  Wean BiPAP when appropriate. 2. Acute diastolic CHF exacerbation-volume removal with hemodialysis. 3. Chest pain-secondary to CHF exacerbation and pulmonary edema.  Symptoms have already resolved.  I do not suspect ACS. 4. Paroxysmal atrial fibrillation-rate is well controlled at this time we will continue to follow on cardiac monitoring. 5. Hypertensive chronic kidney disease-holding home blood pressure medications at this time as her last HD treatment was associated with hypotension that required treatment be discontinued.  Follow BP readings. 6. End-stage renal disease on hemodialysis-patient is due for treatment today.  I have consulted nephrology team and they are making arrangements for treatments.  DVT prophylaxis: Subcu heparin Code Status: Full Family Communication: Plan of care discussed with patient at bedside who verbalizes understanding  Disposition Plan: Treatment in stepdown ICU for hemodialysis BiPAP therapy Consults called: Nephrology -I spoke with Dr. Moshe Cipro Admission status: OBS    Critical Care Procedure Note Authorized and Performed by: [name] MD  Total Critical Care time:  55 minutes  Due to a high probability of clinically significant, life threatening deterioration, the patient required my highest level of preparedness to intervene emergently and I personally spent this critical care time directly and personally managing the patient.  This critical care time included obtaining a history; examining the patient, pulse oximetry; ordering and review of studies; arranging urgent treatment  with development of a management plan; evaluation of patient's response of treatment; frequent reassessment; and discussions with other providers.  This critical care time was performed to assess and manage the high probability of imminent and life threatening deterioration that could result in multi-organ failure.  It was exclusive of separately billable procedures and treating other patients and teaching time.     Irwin Brakeman MD [facility] How to contact the [facility] Attending or Consulting provider [place] or covering provider during after hours [place], for this patient?  1. Check the care team in [facility] and look for a) attending/consulting [facility] provider listed and b) the [facility] team listed 2. Log into [email or web address] and use [place]'s universal password to access. If you do not have the password, please contact the hospital operator. 3. Locate the [facility] provider you are looking for under [facility] and page to a number that you can be directly reached. 4. If you still have difficulty reaching the provider, please page the [facility] (Director on Call) for the Hospitalists listed on amion for assistance.   If 7PM-7AM, please contact night-coverage [email or web address] Password [facility]  10/14/2019, 11:35 AM

## 2019-10-14 NOTE — Consult Note (Signed)
Reason for Consult: To manage dialysis and dialysis related needs Referring Physician: Blakelyn Dinges is an 79 y.o. female with past medical history significant for hypertension, hyperlipidemia, coronary artery disease, history of GI bleeds due to AVMs and also ESRD.  Patient reports being on dialysis for 9 years, currently dialyzes on Mondays, Wednesdays, and Fridays at the [facility].  Patient reports on Friday they were having difficulty with her blood pressure dropping.  Therefore, she says at the end of treatment she weighed the same as at the start.  She requested an extra treatment for Saturday but they were unable to accommodate her.  She noted over the weekend lower extremity edema and then began feeling short of breath last evening.  Presented to the emergency department in [place], found to have pulmonary edema requiring BiPAP.  Was transferred this morning for dialysis here.  Currently, she is in the intensive care unit, has been weaned off of BiPAP to nasal cannula.  Is able to finish complete sentences.  Looking forward to receiving dialysis here.  No other symptoms.  Does not recall her current dry weight but says that it was increased recently   Dialyzes at [facility]- MWF   - orders as of 4 mos ago Duration 3.5 hours  BF 300  DF 600  Dialyzer - nipro cellent dialyzer--> tolerates F180 here EDW 56.5 kg left AVG Bath 2 K/2.5 ca ESA - epogen 8000 units three times a week; venofer 50 mg weekly   hectorol 2.5 mcg three times a week  No heparin   Past Medical History:  Diagnosis Date  . Acute on chronic respiratory failure with hypoxia ([place]) 10/10/2016  . Anxiety   . Arthritis   . AVM (arteriovenous malformation) of colon   . CAD (coronary artery disease)    a. s/p CABG in 2013 b. most recent DES to D1 in 10/2016 with cath showing patent LIMA-LAD, SVG-RI, SVG-Mrg and SVG-RCA  c. NST in 02/2018 showing scar with no significant ischemia  . Carotid artery disease ([place])     a. 56-43% LICA, 09/2949   . Chronic bronchitis ([place])   . Chronic diastolic CHF (congestive heart failure) ([place])    a. 02/2012 Echo EF 60-65%, nl wall motion, Gr 1 DD, mod MR  . Colon cancer ([place]) 1992  . Esophageal stricture   . ESRD on hemodialysis ([place])    ESRD due to HTN, started dialysis 2011 and gets HD at [facility] with Dr Hinda Lenis on MWF schedule.  Access is LUA AVF as of Sept 2014.   Marland Kitchen GERD (gastroesophageal reflux disease)   . High cholesterol 12/2011  . History of blood transfusion 07/2011; 12/2011; 01/2012 X 2; 04/2012  . History of gout   . History of lower GI bleeding   . Hypertension   . Iron deficiency anemia   . Mitral regurgitation    a. Moderate by echo, 02/2012  . Myocardial infarction ([place])   . Ovarian cancer ([place]) 1992  . Pneumonia ~ 2009  . PUD (peptic ulcer disease)   . TIA (transient ischemic attack)     Past Surgical History:  Procedure Laterality Date  . A/V SHUNTOGRAM Left 03/19/2019   Procedure: A/V SHUNTOGRAM;  Surgeon: Katha Cabal, MD;  Location: [place] [facility];  Service: Cardiovascular;  Laterality: Left;  . ABDOMINAL HYSTERECTOMY  1992  . APPENDECTOMY  06/1990  . AV FISTULA PLACEMENT  07/2009   left upper arm  . AV FISTULA PLACEMENT  Right 09/06/2016   Procedure: RIGHT FOREARM ARTERIOVENOUS (AV) GRAFT;  Surgeon: Elam Dutch, MD;  Location: [name] OR;  Service: Vascular;  Laterality: Right;  . AV FISTULA PLACEMENT N/A 02/24/2017   Procedure: INSERTION OF ARTERIOVENOUS (AV) GORE-TEX GRAFT ARM (BRACHIAL AXILLARY);  Surgeon: Katha Cabal, MD;  Location: [facility] ORS;  Service: Vascular;  Laterality: N/A;  . [place] Right 09/06/2016   Procedure: REMOVAL OF Right Arm ARTERIOVENOUS GORETEX GRAFT and Vein Patch angioplasty of brachial artery;  Surgeon: Angelia Mould, MD;  Location: [place];  Service: Vascular;  Laterality: Right;  . BIOPSY  09/26/2019   Procedure: BIOPSY;  Surgeon: Rogene Houston, MD;  [facility];   Service: Endoscopy;;  . COLON RESECTION  1992  . COLON SURGERY    . COLONOSCOPY N/A 03/09/2019   Procedure: COLONOSCOPY;  Surgeon: Rogene Houston, MD;  [facility];  Service: Endoscopy;  Laterality: N/A;  . CORONARY ANGIOPLASTY WITH STENT PLACEMENT  12/15/11   "2"  . CORONARY ANGIOPLASTY WITH STENT PLACEMENT  y/2013   "1; makes total of 3" (05/02/2012)  . CORONARY ARTERY BYPASS GRAFT  06/13/2012   Procedure: CORONARY ARTERY BYPASS GRAFTING (CABG);  Surgeon: Grace Isaac, MD;  Location: [place];  Service: Open Heart Surgery;  Laterality: N/A;  cabg x four;  using left internal mammary artery, and left leg greater saphenous vein harvested endoscopically  . CORONARY STENT INTERVENTION N/A 10/13/2016   Procedure: Coronary Stent Intervention;  Surgeon: Troy Sine, MD;  Location: [place] CV LAB;  Service: Cardiovascular;  Laterality: N/A;  . DIALYSIS/PERMA CATHETER REMOVAL N/A 04/18/2017   Procedure: DIALYSIS/PERMA CATHETER REMOVAL;  Surgeon: Katha Cabal, MD;  Location: [place] [facility];  Service: Cardiovascular;  Laterality: N/A;  . DILATION AND CURETTAGE OF UTERUS    . ESOPHAGOGASTRODUODENOSCOPY  01/20/2012   Procedure: ESOPHAGOGASTRODUODENOSCOPY (EGD);  Surgeon: Ladene Artist, MD,FACG;  Location: [facility] ENDOSCOPY;  Service: Endoscopy;  Laterality: N/A;  . ESOPHAGOGASTRODUODENOSCOPY N/A 03/26/2013   Procedure: ESOPHAGOGASTRODUODENOSCOPY (EGD);  Surgeon: Irene Shipper, MD;  Location: [facility] ENDOSCOPY;  Service: Endoscopy;  Laterality: N/A;  . ESOPHAGOGASTRODUODENOSCOPY N/A 04/30/2015   Procedure: ESOPHAGOGASTRODUODENOSCOPY (EGD);  Surgeon: Rogene Houston, MD;  [facility];  Service: Endoscopy;  Laterality: N/A;  1pm - moved to 10/20 @ 1:10  . ESOPHAGOGASTRODUODENOSCOPY N/A 07/29/2016   Procedure: ESOPHAGOGASTRODUODENOSCOPY (EGD);  Surgeon: Manus Gunning, MD;  Location: [place];  Service: Gastroenterology;  Laterality: N/A;  enteroscopy  .  ESOPHAGOGASTRODUODENOSCOPY N/A 09/26/2019   Procedure: ESOPHAGOGASTRODUODENOSCOPY (EGD);  Surgeon: Rogene Houston, MD;  [facility];  Service: Endoscopy;  Laterality: N/A;  1250  . GIVENS CAPSULE STUDY N/A 03/07/2019   Procedure: GIVENS CAPSULE STUDY;  Surgeon: Rogene Houston, MD;  [facility];  Service: Endoscopy;  Laterality: N/A;  7:30  . INTRAOPERATIVE TRANSESOPHAGEAL ECHOCARDIOGRAM  06/13/2012   Procedure: INTRAOPERATIVE TRANSESOPHAGEAL ECHOCARDIOGRAM;  Surgeon: Grace Isaac, MD;  Location: [place];  Service: Open Heart Surgery;  Laterality: N/A;  . IR GENERIC HISTORICAL  07/26/2016   IR FLUORO GUIDE CV LINE RIGHT 07/26/2016 Greggory Keen, MD [facility]  . IR GENERIC HISTORICAL  07/26/2016   IR US GUIDE VASC ACCESS RIGHT 07/26/2016 Greggory Keen, MD [facility]  . IR GENERIC HISTORICAL  08/02/2016   IR US GUIDE VASC ACCESS RIGHT 08/02/2016 Greggory Keen, MD [facility]  . IR GENERIC HISTORICAL  08/02/2016   IR FLUORO GUIDE CV LINE RIGHT  08/02/2016 Greggory Keen, MD [facility]  . IR RADIOLOGY PERIPHERAL GUIDED IV START  03/28/2017  . IR US GUIDE VASC ACCESS RIGHT  03/28/2017  . LEFT HEART CATH AND CORONARY ANGIOGRAPHY N/A 09/20/2016   Procedure: Left Heart Cath and Coronary Angiography;  Surgeon: [name], MD;  Location: [place] [facility] LAB;  Service: Cardiovascular;  Laterality: N/A;  . LEFT HEART CATH AND CORS/GRAFTS ANGIOGRAPHY N/A 10/13/2016   Procedure: Left Heart Cath and Cors/Grafts Angiography;  Surgeon: Troy Sine, MD;  Location: [place] [facility] LAB;  Service: Cardiovascular;  Laterality: N/A;  . LEFT HEART CATH AND CORS/GRAFTS ANGIOGRAPHY N/A 07/13/2018   Procedure: LEFT HEART CATH AND CORS/GRAFTS ANGIOGRAPHY;  Surgeon: Martinique, Peter M, MD;  Location: [place] [facility];  Service: Cardiovascular;  Laterality: N/A;  . LEFT HEART CATHETERIZATION WITH CORONARY ANGIOGRAM N/A 12/15/2011   Procedure: LEFT HEART CATHETERIZATION WITH CORONARY ANGIOGRAM;   Surgeon: Burnell Blanks, MD;  Location: [facility] CATH LAB;  Service: Cardiovascular;  Laterality: N/A;  . LEFT HEART CATHETERIZATION WITH CORONARY ANGIOGRAM N/A 01/10/2012   Procedure: LEFT HEART CATHETERIZATION WITH CORONARY ANGIOGRAM;  Surgeon: Peter M Martinique, MD;  Location: [facility] CATH LAB;  Service: Cardiovascular;  Laterality: N/A;  . LEFT HEART CATHETERIZATION WITH CORONARY ANGIOGRAM N/A 06/08/2012   Procedure: LEFT HEART CATHETERIZATION WITH CORONARY ANGIOGRAM;  Surgeon: Burnell Blanks, MD;  Location: [facility] CATH LAB;  Service: Cardiovascular;  Laterality: N/A;  . LEFT HEART CATHETERIZATION WITH CORONARY/GRAFT ANGIOGRAM N/A 12/10/2013   Procedure: LEFT HEART CATHETERIZATION WITH Beatrix Fetters;  Surgeon: [name], MD;  Location: [facility] CATH LAB;  Service: Cardiovascular;  Laterality: N/A;  . OVARY SURGERY     ovarian cancer  . POLYPECTOMY  03/09/2019   Procedure: POLYPECTOMY;  Surgeon: Rogene Houston, MD;  [facility];  Service: Endoscopy;;  cecal   . POLYPECTOMY N/A 09/26/2019   Procedure: DUODENAL POLYPECTOMY;  Surgeon: Rogene Houston, MD;  [facility];  Service: Endoscopy;  Laterality: N/A;  . REVISION OF ARTERIOVENOUS GORETEX GRAFT N/A 02/24/2017   Procedure: REVISION OF ARTERIOVENOUS GORETEX GRAFT (RESECTION);  Surgeon: Katha Cabal, MD;  Location: [facility] ORS;  Service: Vascular;  Laterality: N/A;  [place] N/A 10/15/2013   Procedure: Fistulogram;  Surgeon: Serafina Mitchell, MD;  Location: [facility] CATH LAB;  Service: Cardiovascular;  Laterality: N/A;  . THROMBECTOMY / ARTERIOVENOUS GRAFT REVISION  2011   left upper arm  . TUBAL LIGATION  1980's  . UPPER EXTREMITY ANGIOGRAPHY Bilateral 12/06/2016   Procedure: Upper Extremity Angiography;  Surgeon: Katha Cabal, MD;  Location: [place] [facility] LAB;  Service: Cardiovascular;  Laterality: Bilateral;  . UPPER EXTREMITY INTERVENTION Left 06/06/2017   Procedure: UPPER EXTREMITY INTERVENTION;   Surgeon: Katha Cabal, MD;  Location: [place] [facility] LAB;  Service: Cardiovascular;  Laterality: Left;    Family History  Problem Relation Age of Onset  . Heart disease Mother        Heart Disease before age 4  . Hyperlipidemia Mother   . Hypertension Mother   . Diabetes Mother   . Heart attack Mother   . Heart disease Father        Heart Disease before age 6  . Hyperlipidemia Father   . Hypertension Father   . Diabetes Father   . Diabetes Sister   . Hypertension Sister   . Diabetes Brother   . Hyperlipidemia Brother   . Heart attack Brother   . Hypertension Sister   .  Heart attack Brother   . Colon cancer Child 57  . Other Other        noncontributory for early CAD  . Esophageal cancer Neg Hx   . Liver disease Neg Hx   . Kidney disease Neg Hx   . Colon polyps Neg Hx     Social History:  reports that she has never smoked. She has never used smokeless tobacco. She reports that she does not drink alcohol or use drugs.  Allergies:  Allergies  Allergen Reactions  . Aspirin Other (See Comments)    High Doses Mess up her stomach; "makes my bowels have blood in them". Takes 81 mg EC Aspirin   . Penicillins Other (See Comments)    SYNCOPE? , "makes me real weak when I take it; like I'll pass out"  Has patient had a PCN reaction causing immediate rash, facial/tongue/throat swelling, SOB or lightheadedness with hypotension: Yes Has patient had a PCN reaction causing severe rash involving mucus membranes or skin necrosis: no Has patient had a PCN reaction that required hospitalization no Has patient had a PCN reaction occurring within the last 10 years: no If all of the above  . Amlodipine Swelling  . Bactrim [Sulfamethoxazole-Trimethoprim] Rash  . Contrast Media [Iodinated Diagnostic Agents] Itching  . Iron Itching and Other (See Comments)    "they gave me iron in dialysis; had to give me Benadryl cause I had to have the iron" (05/02/2012)  . Nitrofurantoin Hives   . Tylenol [Acetaminophen] Itching and Other (See Comments)    Makes her feet on fire per pt  . Gabapentin Other (See Comments)    Unknown reaction  . Hydralazine Itching  . Dexilant [Dexlansoprazole] Other (See Comments)    Upset stomach  . Levaquin [Levofloxacin In D5w] Rash  . Morphine And Related Itching and Other (See Comments)    Itching in feet  . Plavix [Clopidogrel Bisulfate] Rash  . Protonix [Pantoprazole Sodium] Rash  . Venofer [Ferric Oxide] Itching and Other (See Comments)    Patient reports using Benadryl prior to doses as [facility]    Medications: I have reviewed the patient's current medications.   Results for orders placed or performed during the hospital encounter of 10/14/19 (from the past 48 hour(s))  Respiratory Panel by RT PCR (Flu A&B, Covid) - Nasopharyngeal Swab     Status: None   Collection Time: 10/14/19  9:25 AM   Specimen: Nasopharyngeal Swab  Result Value Ref Range   SARS Coronavirus 2 by RT PCR NEGATIVE NEGATIVE    Comment: (NOTE) SARS-CoV-2 target nucleic acids are NOT DETECTED. The SARS-CoV-2 RNA is generally detectable in upper respiratoy specimens during the acute phase of infection. The lowest concentration of SARS-CoV-2 viral copies this assay can detect is 131 copies/mL. A negative result does not preclude SARS-Cov-2 infection and should not be used as the sole basis for treatment or other patient management decisions. A negative result may occur with  improper specimen collection/handling, submission of specimen other than nasopharyngeal swab, presence of viral mutation(s) within the areas targeted by this assay, and inadequate number of viral copies (<131 copies/mL). A negative result must be combined with clinical observations, patient history, and epidemiological information. The expected result is Negative. Fact Sheet for Patients:  PinkCheek.be Fact Sheet for Healthcare Providers:   GravelBags.it This test is not yet ap proved or cleared by the [place] FDA and  has been authorized for detection and/or diagnosis of SARS-CoV-2 by FDA under an Emergency  Use Authorization (EUA). This EUA will remain  in effect (meaning this test can be used) for the duration of the COVID-19 declaration under Section 564(b)(1) of the Act, 21 U.S.C. section 360bbb-3(b)(1), unless the authorization is terminated or revoked sooner.    Influenza A by PCR NEGATIVE NEGATIVE   Influenza B by PCR NEGATIVE NEGATIVE    Comment: (NOTE) The Xpert Xpress SARS-CoV-2/FLU/RSV assay is intended as an aid in  the diagnosis of influenza from Nasopharyngeal swab specimens and  should not be used as a sole basis for treatment. Nasal washings and  aspirates are unacceptable for Xpert Xpress SARS-CoV-2/FLU/RSV  testing. Fact Sheet for Patients: PinkCheek.be Fact Sheet for Healthcare Providers: GravelBags.it This test is not yet approved or cleared by the [place] FDA and  has been authorized for detection and/or diagnosis of SARS-CoV-2 by  FDA under an Emergency Use Authorization (EUA). This EUA will remain  in effect (meaning this test can be used) for the duration of the  Covid-19 declaration under Section 564(b)(1) of the Act, 21  U.S.C. section 360bbb-3(b)(1), unless the authorization is  terminated or revoked. Performed at [facility], [address] [place], [place] [address]   MRSA PCR Screening     Status: None   Collection Time: 10/14/19  9:25 AM   Specimen: Nasopharyngeal Swab  Result Value Ref Range   MRSA by PCR NEGATIVE NEGATIVE    Comment:        The GeneXpert MRSA Assay (FDA approved for NASAL specimens only), is one component of a comprehensive MRSA colonization surveillance program. It is not intended to diagnose MRSA infection nor to guide or monitor treatment for MRSA  infections. Performed at [facility], [address], [place], [place] [address]   Glucose, capillary     Status: None   Collection Time: 10/14/19  9:47 AM  Result Value Ref Range   Glucose-Capillary 71 70 - 99 mg/dL    Comment: Glucose reference range applies only to samples taken after fasting for at least 8 hours.    DG CHEST PORT 1 VIEW  Result Date: 10/14/2019 CLINICAL DATA:  Chest pain and lower extremity swelling. EXAM: PORTABLE CHEST 1 VIEW COMPARISON:  Single-view of the chest 10/14/2019 and 10/13/2019. FINDINGS: Pulmonary edema seen on the prior examinations has resolved. The lungs are clear. There is cardiomegaly. No pneumothorax. Trace pleural effusions noted. Vascular stent in the left upper arm is identified. IMPRESSION: Resolved pulmonary edema. Cardiomegaly. Electronically Signed   By: Inge Rise M.D.   On: 10/14/2019 09:59    ROS: Lower extremity edema, abdominal distention and shortness of breath with orthopnea Blood pressure (!) 148/55, pulse 60, temperature 97.6 F (36.4 C), temperature source Axillary, resp. rate 18, height 5\' 1"  (1.549 m), weight 62.7 kg, SpO2 100 %. General appearance: alert and cooperative Eyes: conjunctivae/corneas clear. PERRL, EOM's intact. Fundi benign. Neck: no adenopathy, no carotid bruit, no JVD, supple, symmetrical, trachea midline and thyroid not enlarged, symmetric, no tenderness/mass/nodules Resp: diminished breath sounds bibasilar Cardio: regular rate and rhythm, S1, S2 normal, no murmur, click, rub or gallop GI: soft, non-tender; bowel sounds normal; no masses,  no organomegaly Extremities: edema 1+ Neurologic: Grossly normal Left upper arm AV graft with good thrill and bruit  Assessment/Plan: 79 year old black female with multiple medical problems including ESRD.  Completed dialysis on Friday but was over her dry weight.  She now presents with clinical volume overload 1 volume overload- as evidenced byLE edema and pulmonary  edema on chest x-ray.  Do not know her current dry weight.  Months ago was 56.5.  She reports that it has been increased recently.  Current weight 62.7.  Will attempt ultrafiltration of 3 to 4 L today 2 ESRD: Normally Monday Wednesday Friday at [facility].  We will perform dialysis today on schedule 3 Hypertension: Her med list indicates carvedilol, Plendil, and clonidine.  She said that her blood pressure occasionally gets low with dialysis.  She may not need this much blood pressure medicine.  Withhold all for now and see what her blood pressure does after ultrafiltration 4. Anemia of ESRD: Have ordered labs to assess- will give meds as needed 5. Metabolic Bone Disease: We will check phosphorus.  Renvela is on med list-we will order Hectorol here as well   Louis Meckel 10/14/2019, 11:54 AM

## 2019-10-14 NOTE — Procedures (Signed)
    HEMODIALYSIS TREATMENT NOTE:  Outpatient orders obtained from [facility]:  MWF 3.5 hours EDW 62.5kg (pre-weight today is 62.7kg) LUE AVG  300/600 2K/2.5Ca No heparin Hectorol 1.49mg tiw Epogen 8000u tiw HBsAg neg 10/01/19 Usually takes Benadryl 232mcap for itching   3.5 hour heparin-free HD completed.  UFR was decreased once during last 30 minutes of session, then interrupted for last 15 minutes at patient's request due to c/o cramps.  Also requested diphenhydramine for itching.  Goal met: Net UF 3L.  All blood was returned and hemostasis was achieved in 20 minutes.  Diminished breath sounds at lung bases otherwise no change from pre-HD assessment.   AnRockwell AlexandriaRN

## 2019-10-14 NOTE — Care Management Obs Status (Signed)
[place] NOTIFICATION   Patient Details  Name: Shawna [name] MRN: [id number] Date of Birth: August 15, 1940   Medicare Observation Status Notification Given:  Yes    [name] 10/14/2019, 1:20 PM

## 2019-10-15 DIAGNOSIS — I25708 Atherosclerosis of coronary artery bypass graft(s), unspecified, with other forms of angina pectoris: Secondary | ICD-10-CM

## 2019-10-15 DIAGNOSIS — J810 Acute pulmonary edema: Secondary | ICD-10-CM | POA: Diagnosis not present

## 2019-10-15 DIAGNOSIS — R079 Chest pain, unspecified: Secondary | ICD-10-CM | POA: Diagnosis not present

## 2019-10-15 DIAGNOSIS — I480 Paroxysmal atrial fibrillation: Secondary | ICD-10-CM | POA: Diagnosis not present

## 2019-10-15 DIAGNOSIS — J9601 Acute respiratory failure with hypoxia: Secondary | ICD-10-CM | POA: Diagnosis not present

## 2019-10-15 LAB — CBC WITH DIFFERENTIAL/PLATELET
Abs Immature Granulocytes: 0.01 10*3/uL (ref 0.00–0.07)
Basophils Absolute: 0.1 10*3/uL (ref 0.0–0.1)
Basophils Relative: 1 %
Eosinophils Absolute: 0.3 10*3/uL (ref 0.0–0.5)
Eosinophils Relative: 5 %
HCT: 31.1 % — ABNORMAL LOW (ref 36.0–46.0)
Hemoglobin: 9.4 g/dL — ABNORMAL LOW (ref 12.0–15.0)
Immature Granulocytes: 0 %
Lymphocytes Relative: 15 %
Lymphs Abs: 0.8 10*3/uL (ref 0.7–4.0)
MCH: 34.3 pg — ABNORMAL HIGH (ref 26.0–34.0)
MCHC: 30.2 g/dL (ref 30.0–36.0)
MCV: 113.5 fL — ABNORMAL HIGH (ref 80.0–100.0)
Monocytes Absolute: 0.6 10*3/uL (ref 0.1–1.0)
Monocytes Relative: 12 %
Neutro Abs: 3.5 10*3/uL (ref 1.7–7.7)
Neutrophils Relative %: 67 %
Platelets: 203 10*3/uL (ref 150–400)
RBC: 2.74 MIL/uL — ABNORMAL LOW (ref 3.87–5.11)
RDW: 17.4 % — ABNORMAL HIGH (ref 11.5–15.5)
WBC: 5.3 10*3/uL (ref 4.0–10.5)
nRBC: 0 % (ref 0.0–0.2)

## 2019-10-15 NOTE — Progress Notes (Signed)
[facility] Progress Note   Subjective:    Tolerated HD yesterday with net UF 3.1L.   Feels well this AM and ready for d/c.   Objective Vitals:   10/15/19 0700 10/15/19 0712 10/15/19 0741 10/15/19 0800  BP: (!) 132/40   (!) 142/50  Pulse: 65  73 65  Resp:  20 (!) 21 18  Temp:   98.2 F (36.8 C)   TempSrc:   Oral   SpO2: 98%  99% 98%  Weight:      Height:       Physical Exam General: sitting in chair, appears well Heart: RRR, no S4 Lungs: normal WOB conversating, a few scattered rhonchi, no rales or wheezes Abdomen: soft Extremities: no edema Dialysis Access:  LUE AV access +t/b  Additional Objective Labs: Basic Metabolic Panel: Recent Labs  Lab 10/14/19 1350  NA 141  K 4.6  CL 101  CO2 25  GLUCOSE 122*  BUN 57*  CREATININE 10.84*  CALCIUM 8.6*  PHOS 5.6*   Liver Function Tests: Recent Labs  Lab 10/14/19 1350  ALBUMIN 3.3*   No results for input(s): LIPASE, AMYLASE in the last 168 hours. CBC: Recent Labs  Lab 10/14/19 1349 10/15/19 0408  WBC 6.8 5.3  NEUTROABS  --  3.5  HGB 9.1* 9.4*  HCT 29.9* 31.1*  MCV 115.0* 113.5*  PLT 218 203   Blood Culture    Component Value Date/Time   SDES BLOOD RIGHT ARM 10/10/2016 0124   SPECREQUEST IN PEDIATRIC BOTTLE Blood Culture adequate volume 10/10/2016 0124   CULT NO GROWTH 5 DAYS 10/10/2016 0124   REPTSTATUS 10/15/2016 FINAL 10/10/2016 0124    Cardiac Enzymes: No results for input(s): CKTOTAL, CKMB, CKMBINDEX, TROPONINI in the last 168 hours. CBG: Recent Labs  Lab 10/14/19 0947  GLUCAP 71   Iron Studies: No results for input(s): IRON, TIBC, TRANSFERRIN, FERRITIN in the last 72 hours. @lablastinr3 @ Studies/Results: DG CHEST PORT 1 VIEW  Result Date: 10/14/2019 CLINICAL DATA:  Chest pain and lower extremity swelling. EXAM: PORTABLE CHEST 1 VIEW COMPARISON:  Single-view of the chest 10/14/2019 and 10/13/2019. FINDINGS: Pulmonary edema seen on the prior examinations has resolved. The  lungs are clear. There is cardiomegaly. No pneumothorax. Trace pleural effusions noted. Vascular stent in the left upper arm is identified. IMPRESSION: Resolved pulmonary edema. Cardiomegaly. Electronically Signed   By: Inge Rise M.D.   On: 10/14/2019 09:59   Medications: . sodium chloride    . sodium chloride    . sodium chloride     . amiodarone  200 mg Oral Daily  . aspirin EC  81 mg Oral Daily  . Chlorhexidine Gluconate Cloth  6 each Topical Daily  . Chlorhexidine Gluconate Cloth  6 each Topical Q0600  . [START ON 10/16/2019] doxercalciferol  2.5 mcg Intravenous Q M,W,F-HD  . gabapentin  100 mg Oral BID  . heparin  5,000 Units Subcutaneous Q8H  . isosorbide mononitrate  60 mg Oral BID  . mouth rinse  15 mL Mouth Rinse BID  . multivitamin  1 tablet Oral Daily  . sevelamer carbonate  1,600 mg Oral With snacks  . sevelamer carbonate  2,400 mg Oral TID WC  . simvastatin  20 mg Oral QHS  . sodium chloride flush  3 mL Intravenous Q12H    Dialysis Orders: Dialyzes at [facility]- MWF   - orders as of 4 mos ago Duration 3.5 hours  BF 300  DF 600  Dialyzer - nipro cellent dialyzer--> tolerates F180  here EDW 56.5 kg left AVG Bath 2 K/2.5 ca ESA - epogen 8000 units three times a week; venofer 50 mg weekly  hectorol 2.5 mcg three times a week  No heparin   Assessment/Plan: 79 year old black female with multiple medical problems including ESRD.  Completed dialysis on Friday but was over her dry weight.  She now presents with clinical volume overload 1 volume overload- as evidenced byLE edema and pulmonary edema on chest x-ray.  She tolerated UF 3.1 yesterday, did not obtain post weight after HD but this AM wt was 59.9kg down from 62.7kg.  Will need new EDW outpt I suspect.   2 ESRD: Usually MWF, HD Mon 4/5.  HD at home unit tomorrow as d/c today.   3 Hypertension: Her med list indicates carvedilol, Plendil, and clonidine all of which are currently on hold due to intradialytic  hypotension reported.  She is on imdur 60 BID.  Currently normotensive.  May be best to discharge off antiHTN and can be added back outpt as needed to allow better ultrafiltration after this admission for volume overload. She does tell me she only takes antiHTN meds on PRN basis.  4. Anemia of ESRD: Hb 9.1 > 9.4, defer ESA administration to outpt schedule.  5. Metabolic Bone Disease: Phos 5.6, cont current dose of renvela. Hectorol with HD here as well  Jannifer Hick MD 10/15/2019, 9:21 AM  [facility] Pager: [phone]

## 2019-10-15 NOTE — Discharge Instructions (Signed)
Pulmonary Edema Pulmonary edema is unusual buildup of fluid in the lungs. It makes it hard for a person to breathe. This condition is an emergency. Follow these instructions at home: Medicines  Take over-the-counter and prescription medicines only as told by your doctor.  If you were prescribed an antibiotic medicine, take it as told by your doctor. Do not stop taking the antibiotic even if you start to feel better.  Ask your doctor to help you write a plan with information about each medicine you take. This may include: ? Why you are taking it. ? Possible side effects. ? Best time of day to take it. ? Foods to take with it, or foods to avoid. ? When to stop taking it.  Make a list of each medicine, vitamin, or herbal supplement you take. ? Keep the list with you at all times. ? Show the list to your doctor at each visit and before starting a new medicine. ? Keep the list up to date. Lifestyle   Do regular exercise as told by your doctor. It is important to do it safely. You can do this by: ? Asking your doctor what exercises and activities are good and safe for you to do. ? Pacing your activities. This will prevent shortness of breath or chest pain. ? Resting for at least 1 hour before and after meals. ? Asking about cardiac rehabilitation programs. These may include:  Education.  Exercise plans.  Counseling.  Eat a heart-healthy diet that is low in salt, saturated fat, and cholesterol. Your doctor may suggest foods that are high in fiber, such as: ? Fresh fruits and vegetables. ? Whole grains. ? Beans.  Do not use any products that contain nicotine or tobacco, such as cigarettes and e-cigarettes. If you need help quitting, ask your doctor. General instructions  Keep a record of your weight. ? Record your hospital or clinic weight. When you get home, compare it to your scale and record your weight. ? Weigh yourself first thing in the morning each day, and record the  weights. You should weigh yourself every morning after you pee and before you eat breakfast. Wear the same amount of clothing each time you weigh yourself. ? Share your weight record with your doctor. These can help your doctor see if your body is holding extra fluid. ? Tell your doctor right away if you have gained weight quickly, or if you have gained weight as told by your doctor. Your medicines may need to be adjusted.  Check your blood pressure as often as told by your doctor. ? Buy a home blood pressure cuff at your drugstore. ? Record your blood pressure readings. Bring them with you for your clinic visits.  Stay at a healthy weight. Ask your doctor what weight is healthy for you.  Think about doing therapy or being a part of a support group.  Keep all follow-up visits as told by your doctor. This is important. Contact a doctor if:  You have questions about your medicines.  You miss a dose of your medicine. Get help right away if:  You have very bad chest pain, especially if the pain is crushing or pressure-like and spreads to the arms, back, neck, or jaw.  You have more swelling in your hands, feet, ankles, or belly (abdomen).  You feel sick to your stomach (nauseous).  You have strange sweating.  Your skin turns blue or pale.  Your shortness of breath gets worse.  You feel dizzy or  unsteady.  Your vision is blurry.  You have a headache.  You cough up bloody split.  You cannot sleep because it is hard to breathe.  You start to feel a "jumping" or "fluttering" sensation (palpitations) in the chest that is unusual for you.  You feel like you cannot get enough air.  You gain weight rapidly. These symptoms may be an emergency. Do not wait to see if the symptoms will go away. Get medical help right away. Call your local emergency services (911 in the [place] Do not drive yourself to the hospital. Summary  Pulmonary edema is unusual fluid buildup in the lungs that  can make it hard to breathe.  This condition is an emergency.  Keep a record of your weight. Call your doctor if you gain weight rapidly. This information is not intended to replace advice given to you by your health care provider. Make sure you discuss any questions you have with your health care provider. Document Revised: 06/09/2017 Document Reviewed: 09/27/2016 [employer] Patient Education  [employer].    IMPORTANT INFORMATION: PAY CLOSE ATTENTION   PHYSICIAN DISCHARGE INSTRUCTIONS  Follow with Primary care provider  [name], MD  and other consultants as instructed by your Hospitalist Physician  [place] IF SYMPTOMS COME BACK, WORSEN OR NEW PROBLEM DEVELOPS   Please note: You were cared for by a hospitalist during your hospital stay. Every effort will be made to forward records to your primary care provider.  You can request that your primary care provider send for your hospital records if they have not received them.  Once you are discharged, your primary care physician will handle any further medical issues. Please note that NO REFILLS for any discharge medications will be authorized once you are discharged, as it is imperative that you return to your primary care physician (or establish a relationship with a primary care physician if you do not have one) for your post hospital discharge needs so that they can reassess your need for medications and monitor your lab values.  Please get a complete blood count and chemistry panel checked by your Primary MD at your next visit, and again as instructed by your Primary MD.  Get Medicines reviewed and adjusted: Please take all your medications with you for your next visit with your Primary MD  Laboratory/radiological data: Please request your Primary MD to go over all hospital tests and procedure/radiological results at the follow up, please ask your primary care provider to get all Hospital  records sent to his/her office.  In some cases, they will be blood work, cultures and biopsy results pending at the time of your discharge. Please request that your primary care provider follow up on these results.  If you are diabetic, please bring your blood sugar readings with you to your follow up appointment with primary care.    Please call and make your follow up appointments as soon as possible.    Also Note the following: If you experience worsening of your admission symptoms, develop shortness of breath, life threatening emergency, suicidal or homicidal thoughts you must seek medical attention immediately by calling 911 or calling your MD immediately  if symptoms less severe.  You must read complete instructions/literature along with all the possible adverse reactions/side effects for all the Medicines you take and that have been prescribed to you. Take any new Medicines after you have completely understood and accpet all the possible adverse reactions/side effects.  Do not drive when taking Pain medications or sleeping medications (Benzodiazepines)  Do not take more than prescribed Pain, Sleep and Anxiety Medications. It is not advisable to combine anxiety,sleep and pain medications without talking with your primary care practitioner  Special Instructions: If you have smoked or chewed Tobacco  in the last 2 yrs please stop smoking, stop any regular Alcohol  and or any Recreational drug use.  Wear Seat belts while driving.  Do not drive if taking any narcotic, mind altering or controlled substances or recreational drugs or alcohol.

## 2019-10-15 NOTE — Discharge Summary (Signed)
Physician Discharge Summary  Shawna [name] SWF:[id number] DOB: March 05, 1941 DOA: 10/14/2019  PCP: [name], MD  Admit date: 10/14/2019 Discharge date: 10/15/2019  Admitted From:  Home  Disposition:  Home   Recommendations for Outpatient Follow-up:  1. Follow up with PCP in 1 weeks 2. Resume regular dialysis schedule MWF   Discharge Condition: STABLE   CODE STATUS: FULL    Brief Hospitalization Summary: Please see all hospital notes, images, labs for full details of the hospitalization. HPI: Shawna [name] is a 79 y.o. female with medical history significant for end-stage renal disease on hemodialysis Monday Wednesday Friday, GERD, hypertension, coronary artery disease and other medical history detailed below reports that she was not able to complete her last dialysis session on Friday as she developed some hypotension requiring at the treatment be stopped early.  The patient says over the last several hours she has been having chest tightness and shortness of breath she also reports bilateral leg swelling.  She denies fever and chills.  She denies nausea vomiting and diarrhea.  She has had a recent hospitalization due to congestive heart failure.  She is a non-smoker.  She was evaluated at the ED at [facility] and they noted that her troponin was 0.06.  She had pulmonary edema on her chest x-ray.  She required BiPAP treatment due to hypoxia.  Her acute respiratory symptoms improved after the BiPAP was started.  [facility] does not have hemodialysis services available at that facility and requested that the patient be transferred to [facility] for hemodialysis services.  The patient needs an HD treatment today for pulmonary edema.  COVID-19 testing is negative.  1. Acute respiratory failure with hypoxia-secondary to volume overload pulmonary edema-patient was briefly on BiPAP therapy.  She received volume removal with hemodialysis with over 3L removed and feeling much better, symptoms  now resolved.  Pt off bipap and on room air. DC home.  2. Acute diastolic CHF exacerbation-RESOLVED.  Treated with volume removal with hemodialysis. 3. Chest pain-secondary to CHF exacerbation and pulmonary edema.  Symptoms have resolved.  I do not suspect ACS. 4. Paroxysmal atrial fibrillation-rate is well controlled at this time we will continue to follow on cardiac monitoring. 5. Hypertensive chronic kidney disease - reduced her BP meds as she has had soft blood pressure readings. Follow up with outpatient providers for further adjustments/management.  6. End-stage renal disease on hemodialysis-resume regular MWF HD schedule outpatient.   DVT prophylaxis: Subcu heparin Code Status: Full Family Communication: Plan of care discussed with patient at bedside who verbalizes understanding Disposition Plan:  DC home today Consults called: Nephrology -I spoke with Dr. Moshe Cipro Admission status: OBS   Discharge Diagnoses:  Principal Problem:   Pulmonary edema Active Problems:   End stage renal disease (HCC)   Coronary artery disease of bypass graft of native heart with stable angina pectoris (HCC)   AF (paroxysmal atrial fibrillation) (HCC)   Acute respiratory failure with hypoxia (HCC)   Hypertensive chronic kidney disease with stage 5 chronic kidney disease or end stage renal disease ([place])   Chest pain   Discharge Instructions:  Allergies as of 10/15/2019      Reactions   Aspirin Other (See Comments)   High Doses Mess up her stomach; "makes my bowels have blood in them". Takes 81 mg EC Aspirin    Penicillins Other (See Comments)   SYNCOPE? , "makes me real weak when I take it; like I'll pass out" Has patient had a PCN  reaction causing immediate rash, facial/tongue/throat swelling, SOB or lightheadedness with hypotension: Yes Has patient had a PCN reaction causing severe rash involving mucus membranes or skin necrosis: no Has patient had a PCN reaction that required  hospitalization no Has patient had a PCN reaction occurring within the last 10 years: no If all of the above   Amlodipine Swelling   Bactrim [sulfamethoxazole-trimethoprim] Rash   Contrast Media [iodinated Diagnostic Agents] Itching   Iron Itching, Other (See Comments)   "they gave me iron in dialysis; had to give me Benadryl cause I had to have the iron" (05/02/2012)   Nitrofurantoin Hives   Tylenol [acetaminophen] Itching, Other (See Comments)   Makes her feet on fire per pt   Gabapentin Other (See Comments)   Unknown reaction   Hydralazine Itching   Dexilant [dexlansoprazole] Other (See Comments)   Upset stomach   Levaquin [levofloxacin In D5w] Rash   Morphine And Related Itching, Other (See Comments)   Itching in feet   Plavix [clopidogrel Bisulfate] Rash   Protonix [pantoprazole Sodium] Rash   Venofer [ferric Oxide] Itching, Other (See Comments)   Patient reports using Benadryl prior to doses as [place]      Medication List    STOP taking these medications   carvedilol 12.5 MG tablet Commonly known as: COREG   cloNIDine 0.1 MG tablet Commonly known as: Catapres   felodipine 2.5 MG 24 hr tablet Commonly known as: PLENDIL     TAKE these medications   ALPRAZolam 0.5 MG tablet Commonly known as: XANAX Take 0.5 mg by mouth at bedtime as needed for sleep.   amiodarone 200 MG tablet Commonly known as: PACERONE Take 1 tablet (200 mg total) by mouth daily.   aspirin EC 81 MG tablet Take 1 tablet (81 mg total) by mouth daily. Restart on 03/13/19   fluticasone 50 MCG/ACT nasal spray Commonly known as: FLONASE Place 1 spray at bedtime as needed into both nostrils for allergies.   gabapentin 100 MG capsule Commonly known as: NEURONTIN Take 100 mg by mouth 2 (two) times daily.   isosorbide mononitrate 60 MG 24 hr tablet Commonly known as: IMDUR Take 1 tablet (60 mg total) by mouth 2 (two) times daily.   lidocaine-prilocaine cream Commonly known as:  EMLA Apply 1 application topically every Monday, Wednesday, and Friday. Prior to dialysis   loratadine 10 MG tablet Commonly known as: CLARITIN Take 1 tablet (10 mg total) by mouth daily as needed for allergies.   multivitamin Tabs tablet Take 1 tablet by mouth daily.   nitroGLYCERIN 0.4 MG SL tablet Commonly known as: NITROSTAT DISSOLVE ONE TABLET UNDER THE TONGUE EVERY 5 MINUTES AS NEEDED FOR CHEST PAIN.  DO NOT EXCEED A TOTAL OF 3 DOSES IN 15 MINUTES What changed: See the new instructions.   omeprazole 40 MG capsule Commonly known as: PRILOSEC Take 1 capsule (40 mg total) by mouth daily before breakfast.   ondansetron 4 MG disintegrating tablet Commonly known as: ZOFRAN-ODT Take 4 mg by mouth every 8 (eight) hours as needed for nausea or vomiting.   sevelamer carbonate 800 MG tablet Commonly known as: RENVELA Take 1,600-2,400 mg by mouth 3 (three) times daily with meals. Takes 3 tablets with meals and 2 tablets with snacks   simvastatin 20 MG tablet Commonly known as: ZOCOR TAKE 1 TABLET BY MOUTH ONCE DAILY AT BEDTIME   triamcinolone cream 0.1 % Commonly known as: KENALOG Apply 1 application topically daily as needed (Dry skin/ itching).  Follow-up Information    [name], MD. Schedule an appointment as soon as possible for a visit in 2 week(s).   Specialty: Family Medicine Contact information: [place] [address] [phone]        Herminio Commons, MD .   Specialty: Cardiology Contact information: [place] [address] [phone]          Allergies  Allergen Reactions  . Aspirin Other (See Comments)    High Doses Mess up her stomach; "makes my bowels have blood in them". Takes 81 mg EC Aspirin   . Penicillins Other (See Comments)    SYNCOPE? , "makes me real weak when I take it; like I'll pass out"  Has patient had a PCN reaction causing immediate rash, facial/tongue/throat swelling, SOB or lightheadedness  with hypotension: Yes Has patient had a PCN reaction causing severe rash involving mucus membranes or skin necrosis: no Has patient had a PCN reaction that required hospitalization no Has patient had a PCN reaction occurring within the last 10 years: no If all of the above  . Amlodipine Swelling  . Bactrim [Sulfamethoxazole-Trimethoprim] Rash  . Contrast Media [Iodinated Diagnostic Agents] Itching  . Iron Itching and Other (See Comments)    "they gave me iron in dialysis; had to give me Benadryl cause I had to have the iron" (05/02/2012)  . Nitrofurantoin Hives  . Tylenol [Acetaminophen] Itching and Other (See Comments)    Makes her feet on fire per pt  . Gabapentin Other (See Comments)    Unknown reaction  . Hydralazine Itching  . Dexilant [Dexlansoprazole] Other (See Comments)    Upset stomach  . Levaquin [Levofloxacin In D5w] Rash  . Morphine And Related Itching and Other (See Comments)    Itching in feet  . Plavix [Clopidogrel Bisulfate] Rash  . Protonix [Pantoprazole Sodium] Rash  . Venofer [Ferric Oxide] Itching and Other (See Comments)    Patient reports using Benadryl prior to doses as [place] as of 10/15/2019      Reactions   Aspirin Other (See Comments)   High Doses Mess up her stomach; "makes my bowels have blood in them". Takes 81 mg EC Aspirin    Penicillins Other (See Comments)   SYNCOPE? , "makes me real weak when I take it; like I'll pass out" Has patient had a PCN reaction causing immediate rash, facial/tongue/throat swelling, SOB or lightheadedness with hypotension: Yes Has patient had a PCN reaction causing severe rash involving mucus membranes or skin necrosis: no Has patient had a PCN reaction that required hospitalization no Has patient had a PCN reaction occurring within the last 10 years: no If all of the above   Amlodipine Swelling   Bactrim [sulfamethoxazole-trimethoprim] Rash   Contrast Media [iodinated Diagnostic Agents] Itching    Iron Itching, Other (See Comments)   "they gave me iron in dialysis; had to give me Benadryl cause I had to have the iron" (05/02/2012)   Nitrofurantoin Hives   Tylenol [acetaminophen] Itching, Other (See Comments)   Makes her feet on fire per pt   Gabapentin Other (See Comments)   Unknown reaction   Hydralazine Itching   Dexilant [dexlansoprazole] Other (See Comments)   Upset stomach   Levaquin [levofloxacin In D5w] Rash   Morphine And Related Itching, Other (See Comments)   Itching in feet   Plavix [clopidogrel Bisulfate] Rash   Protonix [pantoprazole Sodium] Rash   Venofer [ferric Oxide] Itching, Other (See  Comments)   Patient reports using Benadryl prior to doses as [facility]      Medication List    STOP taking these medications   carvedilol 12.5 MG tablet Commonly known as: COREG   cloNIDine 0.1 MG tablet Commonly known as: Catapres   felodipine 2.5 MG 24 hr tablet Commonly known as: PLENDIL     TAKE these medications   ALPRAZolam 0.5 MG tablet Commonly known as: XANAX Take 0.5 mg by mouth at bedtime as needed for sleep.   amiodarone 200 MG tablet Commonly known as: PACERONE Take 1 tablet (200 mg total) by mouth daily.   aspirin EC 81 MG tablet Take 1 tablet (81 mg total) by mouth daily. Restart on 03/13/19   fluticasone 50 MCG/ACT nasal spray Commonly known as: FLONASE Place 1 spray at bedtime as needed into both nostrils for allergies.   gabapentin 100 MG capsule Commonly known as: NEURONTIN Take 100 mg by mouth 2 (two) times daily.   isosorbide mononitrate 60 MG 24 hr tablet Commonly known as: IMDUR Take 1 tablet (60 mg total) by mouth 2 (two) times daily.   lidocaine-prilocaine cream Commonly known as: EMLA Apply 1 application topically every Monday, Wednesday, and Friday. Prior to dialysis   loratadine 10 MG tablet Commonly known as: CLARITIN Take 1 tablet (10 mg total) by mouth daily as needed for allergies.   multivitamin Tabs  tablet Take 1 tablet by mouth daily.   nitroGLYCERIN 0.4 MG SL tablet Commonly known as: NITROSTAT DISSOLVE ONE TABLET UNDER THE TONGUE EVERY 5 MINUTES AS NEEDED FOR CHEST PAIN.  DO NOT EXCEED A TOTAL OF 3 DOSES IN 15 MINUTES What changed: See the new instructions.   omeprazole 40 MG capsule Commonly known as: PRILOSEC Take 1 capsule (40 mg total) by mouth daily before breakfast.   ondansetron 4 MG disintegrating tablet Commonly known as: ZOFRAN-ODT Take 4 mg by mouth every 8 (eight) hours as needed for nausea or vomiting.   sevelamer carbonate 800 MG tablet Commonly known as: RENVELA Take 1,600-2,400 mg by mouth 3 (three) times daily with meals. Takes 3 tablets with meals and 2 tablets with snacks   simvastatin 20 MG tablet Commonly known as: ZOCOR TAKE 1 TABLET BY MOUTH ONCE DAILY AT BEDTIME   triamcinolone cream 0.1 % Commonly known as: KENALOG Apply 1 application topically daily as needed (Dry skin/ itching).       Procedures/Studies: DG CHEST PORT 1 VIEW  Result Date: 10/14/2019 CLINICAL DATA:  Chest pain and lower extremity swelling. EXAM: PORTABLE CHEST 1 VIEW COMPARISON:  Single-view of the chest 10/14/2019 and 10/13/2019. FINDINGS: Pulmonary edema seen on the prior examinations has resolved. The lungs are clear. There is cardiomegaly. No pneumothorax. Trace pleural effusions noted. Vascular stent in the left upper arm is identified. IMPRESSION: Resolved pulmonary edema. Cardiomegaly. Electronically Signed   By: Inge Rise M.D.   On: 10/14/2019 09:59      Subjective: Pt says she feels great today, no SOB, no CP.  Edema in legs resolved.   Discharge Exam: Vitals:   10/15/19 0741 10/15/19 0800  BP:  (!) 142/50  Pulse: 73 65  Resp: (!) 21 18  Temp: 98.2 F (36.8 C)   SpO2: 99% 98%   Vitals:   10/15/19 0700 10/15/19 0712 10/15/19 0741 10/15/19 0800  BP: (!) 132/40   (!) 142/50  Pulse: 65  73 65  Resp:  20 (!) 21 18  Temp:   98.2 F (36.8 C)  TempSrc:   Oral   SpO2: 98%  99% 98%  Weight:      Height:        General: Pt is alert, awake, not in acute distress Cardiovascular: RRR, S1/S2 +, no rubs, no gallops Respiratory: CTA bilaterally, no wheezing, no rhonchi Abdominal: Soft, NT, ND, bowel sounds + Extremities: no edema, no cyanosis   The results of significant diagnostics from this hospitalization (including imaging, microbiology, ancillary and laboratory) are listed below for reference.     Microbiology: Recent Results (from the past 240 hour(s))  Respiratory Panel by RT PCR (Flu A&B, Covid) - Nasopharyngeal Swab     Status: None   Collection Time: 10/14/19  9:25 AM   Specimen: Nasopharyngeal Swab  Result Value Ref Range Status   SARS Coronavirus 2 by RT PCR NEGATIVE NEGATIVE Final    Comment: (NOTE) SARS-CoV-2 target nucleic acids are NOT DETECTED. The SARS-CoV-2 RNA is generally detectable in upper respiratoy specimens during the acute phase of infection. The lowest concentration of SARS-CoV-2 viral copies this assay can detect is 131 copies/mL. A negative result does not preclude SARS-Cov-2 infection and should not be used as the sole basis for treatment or other patient management decisions. A negative result may occur with  improper specimen collection/handling, submission of specimen other than nasopharyngeal swab, presence of viral mutation(s) within the areas targeted by this assay, and inadequate number of viral copies (<131 copies/mL). A negative result must be combined with clinical observations, patient history, and epidemiological information. The expected result is Negative. Fact Sheet for Patients:  PinkCheek.be Fact Sheet for Healthcare Providers:  GravelBags.it This test is not yet ap proved or cleared by the [place] FDA and  has been authorized for detection and/or diagnosis of SARS-CoV-2 by FDA under an Emergency Use  Authorization (EUA). This EUA will remain  in effect (meaning this test can be used) for the duration of the COVID-19 declaration under Section 564(b)(1) of the Act, 21 U.S.C. section 360bbb-3(b)(1), unless the authorization is terminated or revoked sooner.    Influenza A by PCR NEGATIVE NEGATIVE Final   Influenza B by PCR NEGATIVE NEGATIVE Final    Comment: (NOTE) The Xpert Xpress SARS-CoV-2/FLU/RSV assay is intended as an aid in  the diagnosis of influenza from Nasopharyngeal swab specimens and  should not be used as a sole basis for treatment. Nasal washings and  aspirates are unacceptable for Xpert Xpress SARS-CoV-2/FLU/RSV  testing. Fact Sheet for Patients: PinkCheek.be Fact Sheet for Healthcare Providers: GravelBags.it This test is not yet approved or cleared by the [place] FDA and  has been authorized for detection and/or diagnosis of SARS-CoV-2 by  FDA under an Emergency Use Authorization (EUA). This EUA will remain  in effect (meaning this test can be used) for the duration of the  Covid-19 declaration under Section 564(b)(1) of the Act, 21  U.S.C. section 360bbb-3(b)(1), unless the authorization is  terminated or revoked. Performed at [facility], [address], [place], [place] [address]   MRSA PCR Screening     Status: None   Collection Time: 10/14/19  9:25 AM   Specimen: Nasopharyngeal Swab  Result Value Ref Range Status   MRSA by PCR NEGATIVE NEGATIVE Final    Comment:        The GeneXpert MRSA Assay (FDA approved for NASAL specimens only), is one component of a comprehensive MRSA colonization surveillance program. It is not intended to diagnose MRSA infection nor to guide or monitor treatment for MRSA infections. Performed  at [facility], [address] [place], [place] [address]      Labs: BNP (last 3 results) No results for input(s): BNP in the last 8760 hours. Basic Metabolic  Panel: Recent Labs  Lab 10/14/19 1350  NA 141  K 4.6  CL 101  CO2 25  GLUCOSE 122*  BUN 57*  CREATININE 10.84*  CALCIUM 8.6*  PHOS 5.6*   Liver Function Tests: Recent Labs  Lab 10/14/19 1350  ALBUMIN 3.3*   No results for input(s): LIPASE, AMYLASE in the last 168 hours. No results for input(s): AMMONIA in the last 168 hours. CBC: Recent Labs  Lab 10/14/19 1349 10/15/19 0408  WBC 6.8 5.3  NEUTROABS  --  3.5  HGB 9.1* 9.4*  HCT 29.9* 31.1*  MCV 115.0* 113.5*  PLT 218 203   Cardiac Enzymes: No results for input(s): CKTOTAL, CKMB, CKMBINDEX, TROPONINI in the last 168 hours. BNP: Invalid input(s): POCBNP CBG: Recent Labs  Lab 10/14/19 0947  GLUCAP 71   D-Dimer No results for input(s): DDIMER in the last 72 hours. Hgb A1c No results for input(s): HGBA1C in the last 72 hours. Lipid Profile No results for input(s): CHOL, HDL, LDLCALC, TRIG, CHOLHDL, LDLDIRECT in the last 72 hours. Thyroid function studies No results for input(s): TSH, T4TOTAL, T3FREE, THYROIDAB in the last 72 hours.  Invalid input(s): FREET3 Anemia work up No results for input(s): VITAMINB12, FOLATE, FERRITIN, TIBC, IRON, RETICCTPCT in the last 72 hours. Urinalysis    Component Value Date/Time   COLORURINE YELLOW 12/16/2012 1919   APPEARANCEUR CLOUDY (A) 12/16/2012 1919   LABSPEC 1.009 12/16/2012 1919   PHURINE 7.5 12/16/2012 1919   GLUCOSEU NEGATIVE 12/16/2012 1919   HGBUR TRACE (A) 12/16/2012 1919   BILIRUBINUR NEGATIVE 12/16/2012 1919   KETONESUR NEGATIVE 12/16/2012 1919   PROTEINUR 100 (A) 12/16/2012 1919   UROBILINOGEN 0.2 12/16/2012 1919   NITRITE NEGATIVE 12/16/2012 1919   LEUKOCYTESUR SMALL (A) 12/16/2012 1919   Sepsis Labs Invalid input(s): PROCALCITONIN,  WBC,  LACTICIDVEN Microbiology Recent Results (from the past 240 hour(s))  Respiratory Panel by RT PCR (Flu A&B, Covid) - Nasopharyngeal Swab     Status: None   Collection Time: 10/14/19  9:25 AM   Specimen:  Nasopharyngeal Swab  Result Value Ref Range Status   SARS Coronavirus 2 by RT PCR NEGATIVE NEGATIVE Final    Comment: (NOTE) SARS-CoV-2 target nucleic acids are NOT DETECTED. The SARS-CoV-2 RNA is generally detectable in upper respiratoy specimens during the acute phase of infection. The lowest concentration of SARS-CoV-2 viral copies this assay can detect is 131 copies/mL. A negative result does not preclude SARS-Cov-2 infection and should not be used as the sole basis for treatment or other patient management decisions. A negative result may occur with  improper specimen collection/handling, submission of specimen other than nasopharyngeal swab, presence of viral mutation(s) within the areas targeted by this assay, and inadequate number of viral copies (<131 copies/mL). A negative result must be combined with clinical observations, patient history, and epidemiological information. The expected result is Negative. Fact Sheet for Patients:  PinkCheek.be Fact Sheet for Healthcare Providers:  GravelBags.it This test is not yet ap proved or cleared by the [place] FDA and  has been authorized for detection and/or diagnosis of SARS-CoV-2 by FDA under an Emergency Use Authorization (EUA). This EUA will remain  in effect (meaning this test can be used) for the duration of the COVID-19 declaration under Section 564(b)(1) of the Act, 21 U.S.C. section 360bbb-3(b)(1), unless the  authorization is terminated or revoked sooner.    Influenza A by PCR NEGATIVE NEGATIVE Final   Influenza B by PCR NEGATIVE NEGATIVE Final    Comment: (NOTE) The Xpert Xpress SARS-CoV-2/FLU/RSV assay is intended as an aid in  the diagnosis of influenza from Nasopharyngeal swab specimens and  should not be used as a sole basis for treatment. Nasal washings and  aspirates are unacceptable for Xpert Xpress SARS-CoV-2/FLU/RSV  testing. Fact Sheet for  Patients: PinkCheek.be Fact Sheet for Healthcare Providers: GravelBags.it This test is not yet approved or cleared by the [place] FDA and  has been authorized for detection and/or diagnosis of SARS-CoV-2 by  FDA under an Emergency Use Authorization (EUA). This EUA will remain  in effect (meaning this test can be used) for the duration of the  Covid-19 declaration under Section 564(b)(1) of the Act, 21  U.S.C. section 360bbb-3(b)(1), unless the authorization is  terminated or revoked. Performed at [facility], [address], [place], [place] [address]   MRSA PCR Screening     Status: None   Collection Time: 10/14/19  9:25 AM   Specimen: Nasopharyngeal Swab  Result Value Ref Range Status   MRSA by PCR NEGATIVE NEGATIVE Final    Comment:        The GeneXpert MRSA Assay (FDA approved for NASAL specimens only), is one component of a comprehensive MRSA colonization surveillance program. It is not intended to diagnose MRSA infection nor to guide or monitor treatment for MRSA infections. Performed at [facility], [address], [place], [place] [address]     Time coordinating discharge:   SIGNED:  Irwin Brakeman, MD  [facility] 10/15/2019, 8:55 AM How to contact the [facility] Attending or Consulting provider [place] or covering provider during after hours [place], for this patient?  1. Check the care team in [facility] and look for a) attending/consulting TR[facility] provider listed and b) the [facility] team listed 2. Log into [email or web address] and use [place]'s universal password to access. If you do not have the password, please contact the hospital operator. 3. Locate the [facility] provider you are looking for under [facility] and page to a number that you can be directly reached. 4. If you still have difficulty reaching the provider, please page the [facility] (Director on Call) for the Hospitalists listed on amion for assistance.

## 2019-10-15 NOTE — Progress Notes (Signed)
Patient alert and oriented x4. No complaints of pain, shortness of breath, chest pain, dizziness, nausea or vomiting. Patient up out of bed to chair with stand by assist, gait steady. Patient tolerated PO meds and diet well, appetite good. IV removed without complications. Discharge instructions, appointment follow ups and medication education gone over with patient. Patient expressed full understanding of instructions, follow up information and education with teach back. Patient discharged home with all belongings via car (husband driving patient home).

## 2019-10-15 NOTE — Evaluation (Signed)
Physical Therapy Evaluation Patient Details Name: Shawna [name] MRN: [id number] DOB: Sep 15, 1940 Today's Date: 10/15/2019   History of Present Illness  Shawna [name] is a 79 y.o. female with medical history significant for end-stage renal disease on hemodialysis Monday Wednesday Friday, GERD, hypertension, coronary artery disease and other medical history detailed below reports that she was not able to complete her last dialysis session on Friday as she developed some hypotension requiring at the treatment be stopped early.  The patient says over the last several hours she has been having chest tightness and shortness of breath she also reports bilateral leg swelling.  She denies fever and chills.  She denies nausea vomiting and diarrhea.  She has had a recent hospitalization due to congestive heart failure.  She is a non-smoker.  She was evaluated at the ED at [facility] and they noted that her troponin was 0.06.  She had pulmonary edema on her chest x-ray.  She required BiPAP treatment due to hypoxia.  Her acute respiratory symptoms improved after the BiPAP was started.  [facility] does not have hemodialysis services available at that facility and requested that the patient be transferred to [facility] for hemodialysis services.  The patient needs an HD treatment today for pulmonary edema.  COVID-19 testing is still pending.    Clinical Impression  Patient functioning at baseline for functional mobility and gait.  Plan:  Patient discharged from physical therapy to care of nursing for ambulation daily as tolerated for length of stay.     Follow Up Recommendations No PT follow up    Equipment Recommendations  None recommended by PT    Recommendations for Other Services       Precautions / Restrictions Precautions Precautions: None Restrictions Weight Bearing Restrictions: No      Mobility  Bed Mobility Overal bed mobility: Modified Independent             General bed  mobility comments: increased time  Transfers Overall transfer level: Modified independent               General transfer comment: increased time  Ambulation/Gait Ambulation/Gait assistance: Modified independent (Device/Increase time) Gait Distance (Feet): 100 Feet Assistive device: None Gait Pattern/deviations: WFL(Within Functional Limits) Gait velocity: slightly decreased   General Gait Details: demonstrates good return for ambulation in room and hallways without loss of balance  Stairs            Wheelchair Mobility    Modified Rankin (Stroke Patients Only)       Balance Overall balance assessment: No apparent balance deficits (not formally assessed)                                           Pertinent Vitals/Pain Pain Assessment: No/denies pain    Home Living Family/patient expects to be discharged to:: Private residence Living Arrangements: Spouse/significant other Available Help at Discharge: Family;Available 24 hours/day Type of Home: House Home Access: Stairs to enter Entrance Stairs-Rails: Right Entrance Stairs-Number of Steps: 1 Home Layout: One level Home Equipment: Walker - 4 wheels;Cane - single point;Shower seat      Prior Function Level of Independence: Independent with assistive device(s)         Comments: household ambulator without AD most of time, for longer distances uses SPC PRN     Hand Dominance   Dominant Hand: Right  Extremity/Trunk Assessment   Upper Extremity Assessment Upper Extremity Assessment: Overall WFL for tasks assessed    Lower Extremity Assessment Lower Extremity Assessment: Overall WFL for tasks assessed    Cervical / Trunk Assessment Cervical / Trunk Assessment: Normal  Communication   Communication: No difficulties  Cognition Arousal/Alertness: Awake/alert Behavior During Therapy: WFL for tasks assessed/performed Overall Cognitive Status: Within Functional Limits for tasks  assessed                                        General Comments      Exercises     Assessment/Plan    PT Assessment Patent does not need any further PT services  PT Problem List         PT Treatment Interventions      PT Goals (Current goals can be found in the Care Plan section)  Acute Rehab PT Goals Patient Stated Goal: return home PT Goal Formulation: With patient Time For Goal Achievement: 10/15/19 Potential to Achieve Goals: Good    Frequency     Barriers to discharge        Co-evaluation               AM-PAC PT "6 Clicks" Mobility  Outcome Measure Help needed turning from your back to your side while in a flat bed without using bedrails?: None Help needed moving from lying on your back to sitting on the side of a flat bed without using bedrails?: None Help needed moving to and from a bed to a chair (including a wheelchair)?: None Help needed standing up from a chair using your arms (e.g., wheelchair or bedside chair)?: None Help needed to walk in hospital room?: A Little Help needed climbing 3-5 steps with a railing? : A Little 6 Click Score: 22    End of Session   Activity Tolerance: Patient tolerated treatment well Patient left: in chair;with call bell/phone within reach Nurse Communication: Mobility status PT Visit Diagnosis: Unsteadiness on feet (R26.81);Other abnormalities of gait and mobility (R26.89);Muscle weakness (generalized) (M62.81)    Time: 0630-1601 PT Time Calculation (min) (ACUTE ONLY): 20 min   Charges:   PT Evaluation $PT Eval Moderate Complexity: 1 Mod PT Treatments $Therapeutic Activity: 8-22 mins        9:03 AM, 10/15/19 Lonell Grandchild, MPT Physical Therapist with [facility] 336 [phone] office [phone] mobile phone

## 2019-10-17 ENCOUNTER — Ambulatory Visit (INDEPENDENT_AMBULATORY_CARE_PROVIDER_SITE_OTHER): Payer: Medicare HMO

## 2019-10-17 ENCOUNTER — Other Ambulatory Visit: Payer: Self-pay

## 2019-10-17 ENCOUNTER — Ambulatory Visit (INDEPENDENT_AMBULATORY_CARE_PROVIDER_SITE_OTHER): Payer: Medicare HMO | Admitting: Vascular Surgery

## 2019-10-17 ENCOUNTER — Encounter (INDEPENDENT_AMBULATORY_CARE_PROVIDER_SITE_OTHER): Payer: Self-pay | Admitting: Vascular Surgery

## 2019-10-17 VITALS — BP 148/67 | HR 73 | Resp 16 | Ht 63.0 in | Wt 133.0 lb

## 2019-10-17 DIAGNOSIS — T829XXS Unspecified complication of cardiac and vascular prosthetic device, implant and graft, sequela: Secondary | ICD-10-CM

## 2019-10-17 DIAGNOSIS — I10 Essential (primary) hypertension: Secondary | ICD-10-CM

## 2019-10-17 DIAGNOSIS — N186 End stage renal disease: Secondary | ICD-10-CM

## 2019-10-17 DIAGNOSIS — E782 Mixed hyperlipidemia: Secondary | ICD-10-CM | POA: Diagnosis not present

## 2019-10-17 DIAGNOSIS — Z992 Dependence on renal dialysis: Secondary | ICD-10-CM

## 2019-10-17 DIAGNOSIS — I214 Non-ST elevation (NSTEMI) myocardial infarction: Secondary | ICD-10-CM

## 2019-10-17 NOTE — Progress Notes (Signed)
MRN : [id number]  Shawna [name] is a 79 y.o. (Nov 29, 1940) female who presents with chief complaint of No chief complaint on file. Marland Kitchen  History of Present Illness:   The patient returns to the office for followup of their dialysis access. The function of the access has been stable. The patient denies increased bleeding time or increased recirculation. Patient denies difficulty with cannulation. The patient denies hand pain or other symptoms consistent with steal phenomena.  No significant arm swelling.  The patient denies redness or swelling at the access site. The patient denies fever or chills at home or while on dialysis.  The patient denies amaurosis fugax or recent TIA symptoms. There are no recent neurological changes noted. The patient denies claudication symptoms or rest pain symptoms. The patient denies history of DVT, PE or superficial thrombophlebitis. The patient denies recent episodes of angina or shortness of breath.   Duplex ultrasound of the AV access shows a patent access.  The previously noted stenosis is unchanged compared to last study.       No outpatient medications have been marked as taking for the 10/17/19 encounter (Appointment) with Delana Meyer, Dolores Lory, MD.    Past Medical History:  Diagnosis Date  . Acute on chronic respiratory failure with hypoxia ([place]) 10/10/2016  . Anxiety   . Arthritis   . AVM (arteriovenous malformation) of colon   . CAD (coronary artery disease)    a. s/p CABG in 2013 b. most recent DES to D1 in 10/2016 with cath showing patent LIMA-LAD, SVG-RI, SVG-Mrg and SVG-RCA  c. NST in 02/2018 showing scar with no significant ischemia  . Carotid artery disease ([place])    a. 52-84% LICA, 07/3242   . Chronic bronchitis ([place])   . Chronic diastolic CHF (congestive heart failure) ([place])    a. 02/2012 Echo EF 60-65%, nl wall motion, Gr 1 DD, mod MR  . Colon cancer ([place]) 1992  . Esophageal stricture   . ESRD on hemodialysis ([place])    ESRD due to HTN,  started dialysis 2011 and gets HD at [facility] with Dr Hinda Lenis on MWF schedule.  Access is LUA AVF as of Sept 2014.   Marland Kitchen GERD (gastroesophageal reflux disease)   . High cholesterol 12/2011  . History of blood transfusion 07/2011; 12/2011; 01/2012 X 2; 04/2012  . History of gout   . History of lower GI bleeding   . Hypertension   . Iron deficiency anemia   . Mitral regurgitation    a. Moderate by echo, 02/2012  . Myocardial infarction ([place])   . Ovarian cancer ([place]) 1992  . Pneumonia ~ 2009  . PUD (peptic ulcer disease)   . TIA (transient ischemic attack)     Past Surgical History:  Procedure Laterality Date  . A/V SHUNTOGRAM Left 03/19/2019   Procedure: A/V SHUNTOGRAM;  Surgeon: Katha Cabal, MD;  Location: [place] [facility];  Service: Cardiovascular;  Laterality: Left;  . ABDOMINAL HYSTERECTOMY  1992  . APPENDECTOMY  06/1990  . AV FISTULA PLACEMENT  07/2009   left upper arm  . AV FISTULA PLACEMENT Right 09/06/2016   Procedure: RIGHT FOREARM ARTERIOVENOUS (AV) GRAFT;  Surgeon: Elam Dutch, MD;  [facility] OR;  Service: Vascular;  Laterality: Right;  . AV FISTULA PLACEMENT N/A 02/24/2017   Procedure: INSERTION OF ARTERIOVENOUS (AV) GORE-TEX GRAFT ARM (BRACHIAL AXILLARY);  Surgeon: Katha Cabal, MD;  Location: [facility] ORS;  Service: Vascular;  Laterality: N/A;  . AVGG REMOVAL Right 09/06/2016  Procedure: REMOVAL OF Right Arm ARTERIOVENOUS GORETEX GRAFT and Vein Patch angioplasty of brachial artery;  Surgeon: Angelia Mould, MD;  Location: [place];  Service: Vascular;  Laterality: Right;  . BIOPSY  09/26/2019   Procedure: BIOPSY;  Surgeon: Rogene Houston, MD;  [facility];  Service: Endoscopy;;  . COLON RESECTION  1992  . COLON SURGERY    . COLONOSCOPY N/A 03/09/2019   Procedure: COLONOSCOPY;  Surgeon: Rogene Houston, MD;  [facility];  Service: Endoscopy;  Laterality: N/A;  . CORONARY ANGIOPLASTY WITH STENT PLACEMENT  12/15/11   "2"  .  CORONARY ANGIOPLASTY WITH STENT PLACEMENT  y/2013   "1; makes total of 3" (05/02/2012)  . CORONARY ARTERY BYPASS GRAFT  06/13/2012   Procedure: CORONARY ARTERY BYPASS GRAFTING (CABG);  Surgeon: Grace Isaac, MD;  Location: [place];  Service: Open Heart Surgery;  Laterality: N/A;  cabg x four;  using left internal mammary artery, and left leg greater saphenous vein harvested endoscopically  . CORONARY STENT INTERVENTION N/A 10/13/2016   Procedure: Coronary Stent Intervention;  Surgeon: Troy Sine, MD;  Location: [place] [facility];  Service: Cardiovascular;  Laterality: N/A;  . DIALYSIS/PERMA CATHETER REMOVAL N/A 04/18/2017   Procedure: DIALYSIS/PERMA CATHETER REMOVAL;  Surgeon: Katha Cabal, MD;  Location: [place] [facility];  Service: Cardiovascular;  Laterality: N/A;  . DILATION AND CURETTAGE OF UTERUS    . ESOPHAGOGASTRODUODENOSCOPY  01/20/2012   Procedure: ESOPHAGOGASTRODUODENOSCOPY (EGD);  Surgeon: Ladene Artist, MD,FACG;  Location: [facility] ENDOSCOPY;  Service: Endoscopy;  Laterality: N/A;  . ESOPHAGOGASTRODUODENOSCOPY N/A 03/26/2013   Procedure: ESOPHAGOGASTRODUODENOSCOPY (EGD);  Surgeon: Irene Shipper, MD;  Location: [facility] ENDOSCOPY;  Service: Endoscopy;  Laterality: N/A;  . ESOPHAGOGASTRODUODENOSCOPY N/A 04/30/2015   Procedure: ESOPHAGOGASTRODUODENOSCOPY (EGD);  Surgeon: Rogene Houston, MD;  [facility];  Service: Endoscopy;  Laterality: N/A;  1pm - moved to 10/20 @ 1:10  . ESOPHAGOGASTRODUODENOSCOPY N/A 07/29/2016   Procedure: ESOPHAGOGASTRODUODENOSCOPY (EGD);  Surgeon: Manus Gunning, MD;  Location: [place];  Service: Gastroenterology;  Laterality: N/A;  enteroscopy  . ESOPHAGOGASTRODUODENOSCOPY N/A 09/26/2019   Procedure: ESOPHAGOGASTRODUODENOSCOPY (EGD);  Surgeon: Rogene Houston, MD;  [facility];  Service: Endoscopy;  Laterality: N/A;  1250  . GIVENS CAPSULE STUDY N/A 03/07/2019   Procedure: GIVENS CAPSULE STUDY;  Surgeon: Rogene Houston, MD;   [facility];  Service: Endoscopy;  Laterality: N/A;  7:30  . INTRAOPERATIVE TRANSESOPHAGEAL ECHOCARDIOGRAM  06/13/2012   Procedure: INTRAOPERATIVE TRANSESOPHAGEAL ECHOCARDIOGRAM;  Surgeon: Grace Isaac, MD;  Location: [place];  Service: Open Heart Surgery;  Laterality: N/A;  . IR GENERIC HISTORICAL  07/26/2016   IR FLUORO GUIDE CV LINE RIGHT 07/26/2016 Greggory Keen, MD [facility]  . IR GENERIC HISTORICAL  07/26/2016   IR US GUIDE VASC ACCESS RIGHT 07/26/2016 Greggory Keen, MD [facility]  . IR GENERIC HISTORICAL  08/02/2016   IR US GUIDE VASC ACCESS RIGHT 08/02/2016 Greggory Keen, MD [facility]  . IR GENERIC HISTORICAL  08/02/2016   IR FLUORO GUIDE CV LINE RIGHT 08/02/2016 Greggory Keen, MD [facility]  . IR RADIOLOGY PERIPHERAL GUIDED IV START  03/28/2017  . IR US GUIDE VASC ACCESS RIGHT  03/28/2017  . LEFT HEART CATH AND CORONARY ANGIOGRAPHY N/A 09/20/2016   Procedure: Left Heart Cath and Coronary Angiography;  Surgeon: [name], MD;  Location: [place] [facility];  Service: Cardiovascular;  Laterality: N/A;  . LEFT HEART CATH AND  CORS/GRAFTS ANGIOGRAPHY N/A 10/13/2016   Procedure: Left Heart Cath and Cors/Grafts Angiography;  Surgeon: Troy Sine, MD;  Location: [place] [facility];  Service: Cardiovascular;  Laterality: N/A;  . LEFT HEART CATH AND CORS/GRAFTS ANGIOGRAPHY N/A 07/13/2018   Procedure: LEFT HEART CATH AND CORS/GRAFTS ANGIOGRAPHY;  Surgeon: Martinique, Peter M, MD;  Location: [place] [facility];  Service: Cardiovascular;  Laterality: N/A;  . LEFT HEART CATHETERIZATION WITH CORONARY ANGIOGRAM N/A 12/15/2011   Procedure: LEFT HEART CATHETERIZATION WITH CORONARY ANGIOGRAM;  Surgeon: Burnell Blanks, MD;  Location: [facility] CATH LAB;  Service: Cardiovascular;  Laterality: N/A;  . LEFT HEART CATHETERIZATION WITH CORONARY ANGIOGRAM N/A 01/10/2012   Procedure: LEFT HEART CATHETERIZATION WITH CORONARY ANGIOGRAM;  Surgeon: Peter M Martinique, MD;  Location: [facility] CATH LAB;  Service:  Cardiovascular;  Laterality: N/A;  . LEFT HEART CATHETERIZATION WITH CORONARY ANGIOGRAM N/A 06/08/2012   Procedure: LEFT HEART CATHETERIZATION WITH CORONARY ANGIOGRAM;  Surgeon: Burnell Blanks, MD;  Location: [facility] CATH LAB;  Service: Cardiovascular;  Laterality: N/A;  . LEFT HEART CATHETERIZATION WITH CORONARY/GRAFT ANGIOGRAM N/A 12/10/2013   Procedure: LEFT HEART CATHETERIZATION WITH Beatrix Fetters;  Surgeon: [name], MD;  Location: [facility] CATH LAB;  Service: Cardiovascular;  Laterality: N/A;  . OVARY SURGERY     ovarian cancer  . POLYPECTOMY  03/09/2019   Procedure: POLYPECTOMY;  Surgeon: Rogene Houston, MD;  [facility];  Service: Endoscopy;;  cecal   . POLYPECTOMY N/A 09/26/2019   Procedure: DUODENAL POLYPECTOMY;  Surgeon: Rogene Houston, MD;  [facility];  Service: Endoscopy;  Laterality: N/A;  . REVISION OF ARTERIOVENOUS GORETEX GRAFT N/A 02/24/2017   Procedure: REVISION OF ARTERIOVENOUS GORETEX GRAFT (RESECTION);  Surgeon: Katha Cabal, MD;  Location: [facility] ORS;  Service: Vascular;  Laterality: N/A;  [place] N/A 10/15/2013   Procedure: Fistulogram;  Surgeon: Serafina Mitchell, MD;  Location: [facility] CATH LAB;  Service: Cardiovascular;  Laterality: N/A;  . THROMBECTOMY / ARTERIOVENOUS GRAFT REVISION  2011   left upper arm  . TUBAL LIGATION  1980's  . UPPER EXTREMITY ANGIOGRAPHY Bilateral 12/06/2016   Procedure: Upper Extremity Angiography;  Surgeon: Katha Cabal, MD;  Location: [place] [facility];  Service: Cardiovascular;  Laterality: Bilateral;  . UPPER EXTREMITY INTERVENTION Left 06/06/2017   Procedure: UPPER EXTREMITY INTERVENTION;  Surgeon: Katha Cabal, MD;  Location: [place] [facility];  Service: Cardiovascular;  Laterality: Left;    Social History Social History   Tobacco Use  . Smoking status: Never Smoker  . Smokeless tobacco: Never Used  Substance Use Topics  . Alcohol use: No    Alcohol/week: 0.0  standard drinks  . Drug use: No    Family History Family History  Problem Relation Age of Onset  . Heart disease Mother        Heart Disease before age 41  . Hyperlipidemia Mother   . Hypertension Mother   . Diabetes Mother   . Heart attack Mother   . Heart disease Father        Heart Disease before age 53  . Hyperlipidemia Father   . Hypertension Father   . Diabetes Father   . Diabetes Sister   . Hypertension Sister   . Diabetes Brother   . Hyperlipidemia Brother   . Heart attack Brother   . Hypertension Sister   . Heart attack Brother   . Colon cancer Child 56  . Other Other  noncontributory for early CAD  . Esophageal cancer Neg Hx   . Liver disease Neg Hx   . Kidney disease Neg Hx   . Colon polyps Neg Hx     Allergies  Allergen Reactions  . Aspirin Other (See Comments)    High Doses Mess up her stomach; "makes my bowels have blood in them". Takes 81 mg EC Aspirin   . Penicillins Other (See Comments)    SYNCOPE? , "makes me real weak when I take it; like I'll pass out"  Has patient had a PCN reaction causing immediate rash, facial/tongue/throat swelling, SOB or lightheadedness with hypotension: Yes Has patient had a PCN reaction causing severe rash involving mucus membranes or skin necrosis: no Has patient had a PCN reaction that required hospitalization no Has patient had a PCN reaction occurring within the last 10 years: no If all of the above  . Amlodipine Swelling  . Bactrim [Sulfamethoxazole-Trimethoprim] Rash  . Contrast Media [Iodinated Diagnostic Agents] Itching  . Iron Itching and Other (See Comments)    "they gave me iron in dialysis; had to give me Benadryl cause I had to have the iron" (05/02/2012)  . Nitrofurantoin Hives  . Tylenol [Acetaminophen] Itching and Other (See Comments)    Makes her feet on fire per pt  . Gabapentin Other (See Comments)    Unknown reaction  . Hydralazine Itching  . Dexilant [Dexlansoprazole] Other (See  Comments)    Upset stomach  . Levaquin [Levofloxacin In D5w] Rash  . Morphine And Related Itching and Other (See Comments)    Itching in feet  . Plavix [Clopidogrel Bisulfate] Rash  . Protonix [Pantoprazole Sodium] Rash  . Venofer [Ferric Oxide] Itching and Other (See Comments)    Patient reports using Benadryl prior to doses as [place] (Negative unless checked)  Constitutional: [] Weight loss  [] Fever  [] Chills Cardiac: [] Chest pain   [] Chest pressure   [] Palpitations   [] Shortness of breath when laying flat   [] Shortness of breath with exertion. Vascular:  [] Pain in legs with walking   [] Pain in legs at rest  [] History of DVT   [] Phlebitis   [] Swelling in legs   [] Varicose veins   [] Non-healing ulcers Pulmonary:   [] Uses home oxygen   [] Productive cough   [] Hemoptysis   [] Wheeze  [] COPD   [] Asthma Neurologic:  [] Dizziness   [] Seizures   [] History of stroke   [] History of TIA  [] Aphasia   [] Vissual changes   [] Weakness or numbness in arm   [] Weakness or numbness in leg Musculoskeletal:   [] Joint swelling   [] Joint pain   [] Low back pain Hematologic:  [] Easy bruising  [] Easy bleeding   [] Hypercoagulable state   [] Anemic Gastrointestinal:  [] Diarrhea   [] Vomiting  [x] Gastroesophageal reflux/heartburn   [] Difficulty swallowing. Genitourinary:  [x] Chronic kidney disease   [] Difficult urination  [] Frequent urination   [] Blood in urine Skin:  [] Rashes   [] Ulcers  Psychological:  [] History of anxiety   []  History of major depression.  Physical Examination  There were no vitals filed for this visit. There is no height or weight on file to calculate BMI. Gen: WD/WN, NAD Head: [place]/AT, No temporalis wasting.  Ear/Nose/Throat: Hearing grossly intact, nares w/o erythema or drainage Eyes: PER, EOMI, sclera nonicteric.  Neck: Supple, no large masses.   Pulmonary:  Good air movement, no audible wheezing bilaterally, no use of accessory muscles.  Cardiac: RRR, no  JVD Vascular: left arm AV graft good  thrill good bruit Vessel Right Left  Radial Palpable Palpable  Gastrointestinal: Non-distended. No guarding/no peritoneal signs.  Musculoskeletal: M/S 5/5 throughout.  No deformity or atrophy.  Neurologic: CN 2-12 intact. Symmetrical.  Speech is fluent. Motor exam as listed above. Psychiatric: Judgment intact, Mood & affect appropriate for pt's clinical situation. Dermatologic: No rashes or ulcers noted.  No changes consistent with cellulitis.   CBC Lab Results  Component Value Date   WBC 5.3 10/15/2019   HGB 9.4 (L) 10/15/2019   HCT 31.1 (L) 10/15/2019   MCV 113.5 (H) 10/15/2019   PLT 203 10/15/2019    BMET    Component Value Date/Time   NA 141 10/14/2019 1350   K 4.6 10/14/2019 1350   CL 101 10/14/2019 1350   CO2 25 10/14/2019 1350   GLUCOSE 122 (H) 10/14/2019 1350   BUN 57 (H) 10/14/2019 1350   CREATININE 10.84 (H) 10/14/2019 1350   CREATININE 6.57 (H) 03/05/2019 1159   CALCIUM 8.6 (L) 10/14/2019 1350   CALCIUM 8.1 (L) 05/29/2013 1814   GFRNONAA 3 (L) 10/14/2019 1350   GFRNONAA 6 (L) 03/05/2019 1159   GFRAA 3 (L) 10/14/2019 1350   GFRAA 6 (L) 03/05/2019 1159   Estimated Creatinine Clearance: 3.5 mL/min (A) (by C-G formula based on SCr of 10.84 mg/dL (H)).  COAG Lab Results  Component Value Date   INR 1.1 03/08/2019   INR 1.1 03/07/2019   INR 1.0 12/14/2018    Radiology DG CHEST PORT 1 VIEW  Result Date: 10/14/2019 CLINICAL DATA:  Chest pain and lower extremity swelling. EXAM: PORTABLE CHEST 1 VIEW COMPARISON:  Single-view of the chest 10/14/2019 and 10/13/2019. FINDINGS: Pulmonary edema seen on the prior examinations has resolved. The lungs are clear. There is cardiomegaly. No pneumothorax. Trace pleural effusions noted. Vascular stent in the left upper arm is identified. IMPRESSION: Resolved pulmonary edema. Cardiomegaly. Electronically Signed   By: Inge Rise M.D.   On: 10/14/2019 09:59     Assessment/Plan 1.  Complication of vascular access for dialysis, sequela Recommend:  The patient is doing well and currently has adequate dialysis access. The patient's dialysis center is not reporting any access issues. Flow pattern is stable when compared to the prior ultrasound.  The patient should have a duplex ultrasound of the dialysis access in 6 months.  The patient will follow-up with me in the office after each ultrasound   2. ESRD on hemodialysis ([place]) At the present time the patient has adequate dialysis access.  Continue hemodialysis as ordered without interruption.  Avoid nephrotoxic medications and dehydration.  Further plans per nephrology  3. Mixed hyperlipidemia Continue statin as ordered and reviewed, no changes at this time   4. Essential hypertension Continue antihypertensive medications as already ordered, these medications have been reviewed and there are no changes at this time.   5. Non-STEMI (non-ST elevated myocardial infarction) ([place]) Continue cardiac and antihypertensive medications as already ordered and reviewed, no changes at this time.  Continue statin as ordered and reviewed, no changes at this time  Nitrates PRN for chest pain     Hortencia Pilar, MD  10/17/2019 1:16 PM

## 2019-10-22 ENCOUNTER — Encounter: Payer: Self-pay | Admitting: Cardiovascular Disease

## 2019-10-22 ENCOUNTER — Telehealth (INDEPENDENT_AMBULATORY_CARE_PROVIDER_SITE_OTHER): Payer: Medicare HMO | Admitting: Cardiovascular Disease

## 2019-10-22 VITALS — BP 125/44 | HR 83 | Ht 61.0 in | Wt 130.0 lb

## 2019-10-22 DIAGNOSIS — I472 Ventricular tachycardia: Secondary | ICD-10-CM

## 2019-10-22 DIAGNOSIS — I25708 Atherosclerosis of coronary artery bypass graft(s), unspecified, with other forms of angina pectoris: Secondary | ICD-10-CM

## 2019-10-22 DIAGNOSIS — E785 Hyperlipidemia, unspecified: Secondary | ICD-10-CM

## 2019-10-22 DIAGNOSIS — I480 Paroxysmal atrial fibrillation: Secondary | ICD-10-CM

## 2019-10-22 DIAGNOSIS — I10 Essential (primary) hypertension: Secondary | ICD-10-CM

## 2019-10-22 DIAGNOSIS — N186 End stage renal disease: Secondary | ICD-10-CM

## 2019-10-22 DIAGNOSIS — K922 Gastrointestinal hemorrhage, unspecified: Secondary | ICD-10-CM

## 2019-10-22 DIAGNOSIS — Z992 Dependence on renal dialysis: Secondary | ICD-10-CM

## 2019-10-22 DIAGNOSIS — I4729 Other ventricular tachycardia: Secondary | ICD-10-CM

## 2019-10-22 MED ORDER — ISOSORBIDE MONONITRATE ER 60 MG PO TB24: 60.0000 mg | ORAL_TABLET | Freq: Two times a day (BID) | ORAL | 6 refills | Status: DC

## 2019-10-22 NOTE — Patient Instructions (Addendum)
Medication Instructions:   Imdur refilled  - sent to [facility] today.   Continue all other medications.    Labwork: none  Testing/Procedures: none  Follow-Up: 6 months    Any Other Special Instructions Will Be Listed Below (If Applicable).  If you need a refill on your cardiac medications before your next appointment, please call your pharmacy.

## 2019-10-22 NOTE — Addendum Note (Signed)
Addended by: Laurine Blazer on: 10/22/2019 04:53 PM   Modules accepted: Orders

## 2019-10-22 NOTE — Progress Notes (Signed)
Virtual Visit via Telephone Note   This visit type was conducted due to national recommendations for restrictions regarding the COVID-19 Pandemic (e.g. social distancing) in an effort to limit this patient's exposure and mitigate transmission in our community.  Due to her co-morbid illnesses, this patient is at least at moderate risk for complications without adequate follow up.  This format is felt to be most appropriate for this patient at this time.  The patient did not have access to video technology/had technical difficulties with video requiring transitioning to audio format only (telephone).  All issues noted in this document were discussed and addressed.  No physical exam could be performed with this format.  Please refer to the patient's chart for her  consent to telehealth for [facility].   The patient was identified using 2 identifiers.  Date:  10/22/2019   ID:  Shawna [name], DOB Mar 25, 1941, MRN [id number]  Patient Location: Home Provider Location: Office  PCP:  [name], MD  Cardiologist:  Kate Sable, MD  Electrophysiologist:  None   Evaluation Performed:  Follow-Up Visit  Chief Complaint:  HTN, CAD  History of Present Illness:    Shawna [name] is a 79 y.o. female with CAD (s/p CABG in 2013,DES to D1 in 10/2016 with cath showing patent LIMA-LAD, SVG-RI, SVG-Mrg and SVG-RCA, cath in 07/2018 showingpatentgrafts with occlusion of D1 at prior stent site and progression of PDA disease --> medical management recommended), HTN, HLD, PAF (not on anticoagulation given history of GIB),NSVT (on Amiodarone)and ESRD.  She was hospitalized earlier this month for acute hypoxic respiratory failure secondary to volume overload.  She received volume removal with hemodialysis with over 3 L removed and improved significantly.  She had some low blood pressure readings while she was hospitalized.  She has a long history of noncompliance with antihypertensive therapy and makes  adjustments on her own.  CXR showed no pulmonary edema.  She seldom has chest pains.  She took Imdur for 30 days and then stopped it after being discharged.  Past Medical History:  Diagnosis Date  . Acute on chronic respiratory failure with hypoxia ([place]) 10/10/2016  . Anxiety   . Arthritis   . AVM (arteriovenous malformation) of colon   . CAD (coronary artery disease)    a. s/p CABG in 2013 b. most recent DES to D1 in 10/2016 with cath showing patent LIMA-LAD, SVG-RI, SVG-Mrg and SVG-RCA  c. NST in 02/2018 showing scar with no significant ischemia  . Carotid artery disease ([place])    a. 14-43% LICA, 07/5398   . Chronic bronchitis ([place])   . Chronic diastolic CHF (congestive heart failure) ([place])    a. 02/2012 Echo EF 60-65%, nl wall motion, Gr 1 DD, mod MR  . Colon cancer ([place]) 1992  . Esophageal stricture   . ESRD on hemodialysis ([place])    ESRD due to HTN, started dialysis 2011 and gets HD at [facility] with Dr Hinda Lenis on MWF schedule.  Access is LUA AVF as of Sept 2014.   Marland Kitchen GERD (gastroesophageal reflux disease)   . High cholesterol 12/2011  . History of blood transfusion 07/2011; 12/2011; 01/2012 X 2; 04/2012  . History of gout   . History of lower GI bleeding   . Hypertension   . Iron deficiency anemia   . Mitral regurgitation    a. Moderate by echo, 02/2012  . Myocardial infarction ([place])   . Ovarian cancer ([place]) 1992  . Pneumonia ~ 2009  . PUD (peptic  ulcer disease)   . TIA (transient ischemic attack)    Past Surgical History:  Procedure Laterality Date  . A/V SHUNTOGRAM Left 03/19/2019   Procedure: A/V SHUNTOGRAM;  Surgeon: Katha Cabal, MD;  Location: [place] CV LAB;  Service: Cardiovascular;  Laterality: Left;  . ABDOMINAL HYSTERECTOMY  1992  . APPENDECTOMY  06/1990  . AV FISTULA PLACEMENT  07/2009   left upper arm  . AV FISTULA PLACEMENT Right 09/06/2016   Procedure: RIGHT FOREARM ARTERIOVENOUS (AV) GRAFT;  Surgeon: Elam Dutch, MD;  Location: [facility] OR;   Service: Vascular;  Laterality: Right;  . AV FISTULA PLACEMENT N/A 02/24/2017   Procedure: INSERTION OF ARTERIOVENOUS (AV) GORE-TEX GRAFT ARM (BRACHIAL AXILLARY);  Surgeon: Katha Cabal, MD;  Location: [facility] ORS;  Service: Vascular;  Laterality: N/A;  . [place] Right 09/06/2016   Procedure: REMOVAL OF Right Arm ARTERIOVENOUS GORETEX GRAFT and Vein Patch angioplasty of brachial artery;  Surgeon: Angelia Mould, MD;  Location: [place];  Service: Vascular;  Laterality: Right;  . BIOPSY  09/26/2019   Procedure: BIOPSY;  Surgeon: Rogene Houston, MD;  [facility];  Service: Endoscopy;;  . COLON RESECTION  1992  . COLON SURGERY    . COLONOSCOPY N/A 03/09/2019   Procedure: COLONOSCOPY;  Surgeon: Rogene Houston, MD;  [facility];  Service: Endoscopy;  Laterality: N/A;  . CORONARY ANGIOPLASTY WITH STENT PLACEMENT  12/15/11   "2"  . CORONARY ANGIOPLASTY WITH STENT PLACEMENT  y/2013   "1; makes total of 3" (05/02/2012)  . CORONARY ARTERY BYPASS GRAFT  06/13/2012   Procedure: CORONARY ARTERY BYPASS GRAFTING (CABG);  Surgeon: Grace Isaac, MD;  Location: [place];  Service: Open Heart Surgery;  Laterality: N/A;  cabg x four;  using left internal mammary artery, and left leg greater saphenous vein harvested endoscopically  . CORONARY STENT INTERVENTION N/A 10/13/2016   Procedure: Coronary Stent Intervention;  Surgeon: Troy Sine, MD;  Location: [place] CV LAB;  Service: Cardiovascular;  Laterality: N/A;  . DIALYSIS/PERMA CATHETER REMOVAL N/A 04/18/2017   Procedure: DIALYSIS/PERMA CATHETER REMOVAL;  Surgeon: Katha Cabal, MD;  Location: [place] CV LAB;  Service: Cardiovascular;  Laterality: N/A;  . DILATION AND CURETTAGE OF UTERUS    . ESOPHAGOGASTRODUODENOSCOPY  01/20/2012   Procedure: ESOPHAGOGASTRODUODENOSCOPY (EGD);  Surgeon: Ladene Artist, MD,FACG;  Location: [facility] ENDOSCOPY;  Service: Endoscopy;  Laterality: N/A;  . ESOPHAGOGASTRODUODENOSCOPY N/A  03/26/2013   Procedure: ESOPHAGOGASTRODUODENOSCOPY (EGD);  Surgeon: Irene Shipper, MD;  Location: [facility] ENDOSCOPY;  Service: Endoscopy;  Laterality: N/A;  . ESOPHAGOGASTRODUODENOSCOPY N/A 04/30/2015   Procedure: ESOPHAGOGASTRODUODENOSCOPY (EGD);  Surgeon: Rogene Houston, MD;  [facility];  Service: Endoscopy;  Laterality: N/A;  1pm - moved to 10/20 @ 1:10  . ESOPHAGOGASTRODUODENOSCOPY N/A 07/29/2016   Procedure: ESOPHAGOGASTRODUODENOSCOPY (EGD);  Surgeon: Manus Gunning, MD;  Location: [place];  Service: Gastroenterology;  Laterality: N/A;  enteroscopy  . ESOPHAGOGASTRODUODENOSCOPY N/A 09/26/2019   Procedure: ESOPHAGOGASTRODUODENOSCOPY (EGD);  Surgeon: Rogene Houston, MD;  [facility];  Service: Endoscopy;  Laterality: N/A;  1250  . GIVENS CAPSULE STUDY N/A 03/07/2019   Procedure: GIVENS CAPSULE STUDY;  Surgeon: Rogene Houston, MD;  [facility];  Service: Endoscopy;  Laterality: N/A;  7:30  . INTRAOPERATIVE TRANSESOPHAGEAL ECHOCARDIOGRAM  06/13/2012   Procedure: INTRAOPERATIVE TRANSESOPHAGEAL ECHOCARDIOGRAM;  Surgeon: Grace Isaac, MD;  Location: [place];  Service: Open Heart Surgery;  Laterality: N/A;  .  IR GENERIC HISTORICAL  07/26/2016   IR FLUORO GUIDE CV LINE RIGHT 07/26/2016 Greggory Keen, MD [facility]  . IR GENERIC HISTORICAL  07/26/2016   IR US GUIDE VASC ACCESS RIGHT 07/26/2016 Greggory Keen, MD [facility]  . IR GENERIC HISTORICAL  08/02/2016   IR US GUIDE VASC ACCESS RIGHT 08/02/2016 Greggory Keen, MD [facility]  . IR GENERIC HISTORICAL  08/02/2016   IR FLUORO GUIDE CV LINE RIGHT 08/02/2016 Greggory Keen, MD [facility]  . IR RADIOLOGY PERIPHERAL GUIDED IV START  03/28/2017  . IR US GUIDE VASC ACCESS RIGHT  03/28/2017  . LEFT HEART CATH AND CORONARY ANGIOGRAPHY N/A 09/20/2016   Procedure: Left Heart Cath and Coronary Angiography;  Surgeon: Shawna Crome, MD;  Location: [place] CV LAB;  Service: Cardiovascular;  Laterality: N/A;  .  LEFT HEART CATH AND CORS/GRAFTS ANGIOGRAPHY N/A 10/13/2016   Procedure: Left Heart Cath and Cors/Grafts Angiography;  Surgeon: Troy Sine, MD;  Location: [place] CV LAB;  Service: Cardiovascular;  Laterality: N/A;  . LEFT HEART CATH AND CORS/GRAFTS ANGIOGRAPHY N/A 07/13/2018   Procedure: LEFT HEART CATH AND CORS/GRAFTS ANGIOGRAPHY;  Surgeon: Martinique, Peter M, MD;  Location: [place] CV LAB;  Service: Cardiovascular;  Laterality: N/A;  . LEFT HEART CATHETERIZATION WITH CORONARY ANGIOGRAM N/A 12/15/2011   Procedure: LEFT HEART CATHETERIZATION WITH CORONARY ANGIOGRAM;  Surgeon: Burnell Blanks, MD;  Location: [facility] CATH LAB;  Service: Cardiovascular;  Laterality: N/A;  . LEFT HEART CATHETERIZATION WITH CORONARY ANGIOGRAM N/A 01/10/2012   Procedure: LEFT HEART CATHETERIZATION WITH CORONARY ANGIOGRAM;  Surgeon: Peter M Martinique, MD;  Location: [facility] CATH LAB;  Service: Cardiovascular;  Laterality: N/A;  . LEFT HEART CATHETERIZATION WITH CORONARY ANGIOGRAM N/A 06/08/2012   Procedure: LEFT HEART CATHETERIZATION WITH CORONARY ANGIOGRAM;  Surgeon: Burnell Blanks, MD;  Location: [facility] CATH LAB;  Service: Cardiovascular;  Laterality: N/A;  . LEFT HEART CATHETERIZATION WITH CORONARY/GRAFT ANGIOGRAM N/A 12/10/2013   Procedure: LEFT HEART CATHETERIZATION WITH Beatrix Fetters;  Surgeon: [name], MD;  Location: [facility] CATH LAB;  Service: Cardiovascular;  Laterality: N/A;  . OVARY SURGERY     ovarian cancer  . POLYPECTOMY  03/09/2019   Procedure: POLYPECTOMY;  Surgeon: Rogene Houston, MD;  [facility];  Service: Endoscopy;;  cecal   . POLYPECTOMY N/A 09/26/2019   Procedure: DUODENAL POLYPECTOMY;  Surgeon: Rogene Houston, MD;  [facility];  Service: Endoscopy;  Laterality: N/A;  . REVISION OF ARTERIOVENOUS GORETEX GRAFT N/A 02/24/2017   Procedure: REVISION OF ARTERIOVENOUS GORETEX GRAFT (RESECTION);  Surgeon: Katha Cabal, MD;  Location: [facility] ORS;  Service:  Vascular;  Laterality: N/A;  [place] N/A 10/15/2013   Procedure: Fistulogram;  Surgeon: Serafina Mitchell, MD;  Location: [facility] CATH LAB;  Service: Cardiovascular;  Laterality: N/A;  . THROMBECTOMY / ARTERIOVENOUS GRAFT REVISION  2011   left upper arm  . TUBAL LIGATION  1980's  . UPPER EXTREMITY ANGIOGRAPHY Bilateral 12/06/2016   Procedure: Upper Extremity Angiography;  Surgeon: Katha Cabal, MD;  Location: [place] CV LAB;  Service: Cardiovascular;  Laterality: Bilateral;  . UPPER EXTREMITY INTERVENTION Left 06/06/2017   Procedure: UPPER EXTREMITY INTERVENTION;  Surgeon: Katha Cabal, MD;  Location: [place] CV LAB;  Service: Cardiovascular;  Laterality: Left;     Current Meds  Medication Sig  . ALPRAZolam (XANAX) 0.5 MG tablet Take 0.5 mg by mouth at bedtime as needed for sleep.   Marland Kitchen amiodarone (PACERONE) 200 MG tablet Take  1 tablet (200 mg total) by mouth daily.  Marland Kitchen aspirin EC 81 MG tablet Take 1 tablet (81 mg total) by mouth daily. Restart on 03/13/19  . doxycycline (DORYX) 100 MG EC tablet Take 100 mg by mouth 2 (two) times daily.  . fluticasone (FLONASE) 50 MCG/ACT nasal spray Place 1 spray at bedtime as needed into both nostrils for allergies.   Marland Kitchen gabapentin (NEURONTIN) 100 MG capsule Take 100 mg by mouth 2 (two) times daily.   Marland Kitchen lidocaine-prilocaine (EMLA) cream Apply 1 application topically every Monday, Wednesday, and Friday. Prior to dialysis  . loratadine (CLARITIN) 10 MG tablet Take 1 tablet (10 mg total) by mouth daily as needed for allergies.  . multivitamin (RENA-VIT) TABS tablet Take 1 tablet by mouth daily.  . nitroGLYCERIN (NITROSTAT) 0.4 MG SL tablet DISSOLVE ONE TABLET UNDER THE TONGUE EVERY 5 MINUTES AS NEEDED FOR CHEST PAIN.  DO NOT EXCEED A TOTAL OF 3 DOSES IN 15 MINUTES (Patient taking differently: Place 0.4 mg under the tongue every 5 (five) minutes as needed for chest pain. )  . omeprazole (PRILOSEC) 40 MG capsule Take 1 capsule (40 mg total) by mouth  daily before breakfast.  . ondansetron (ZOFRAN-ODT) 4 MG disintegrating tablet Take 4 mg by mouth every 8 (eight) hours as needed for nausea or vomiting.   . sevelamer carbonate (RENVELA) 800 MG tablet Take 1,600-2,400 mg by mouth 3 (three) times daily with meals. Takes 3 tablets with meals and 2 tablets with snacks  . simvastatin (ZOCOR) 20 MG tablet TAKE 1 TABLET BY MOUTH ONCE DAILY AT BEDTIME (Patient taking differently: Take 20 mg by mouth at bedtime. )  . triamcinolone cream (KENALOG) 0.1 % Apply 1 application topically daily as needed (Dry skin/ itching).      Allergies:   Aspirin, Penicillins, Amlodipine, Bactrim [sulfamethoxazole-trimethoprim], Contrast media [iodinated diagnostic agents], Iron, Nitrofurantoin, Tylenol [acetaminophen], Gabapentin, Hydralazine, Dexilant [dexlansoprazole], Levaquin [levofloxacin in d5w], Morphine and related, Plavix [clopidogrel bisulfate], Protonix [pantoprazole sodium], and Venofer [ferric oxide]   Social History   Tobacco Use  . Smoking status: Never Smoker  . Smokeless tobacco: Never Used  Substance Use Topics  . Alcohol use: No    Alcohol/week: 0.0 standard drinks  . Drug use: No     Family Hx: The patient's family history includes Colon cancer (age of onset: 65) in her child; Diabetes in her brother, father, mother, and sister; Heart attack in her brother, brother, and mother; Heart disease in her father and mother; Hyperlipidemia in her brother, father, and mother; Hypertension in her father, mother, sister, and sister; Other in an other family member. There is no history of Esophageal cancer, Liver disease, Kidney disease, or Colon polyps.  ROS:   Please see the history of present illness.     All other systems reviewed and are negative.   Prior CV studies:   The following studies were reviewed today:  Cardiac Catheterization: 07/2018  Ost RPDA to RPDA lesion is 75% stenosed.  Mid LM to Dist LM lesion is 60% stenosed.  Ost Ramus to  Ramus lesion is 80% stenosed.  Ost Cx to Mid Cx lesion is 99% stenosed.  Prox LAD lesion is 90% stenosed.  Ost 1st Diag to 1st Diag lesion is 100% stenosed.  Ost RCA to Prox RCA lesion is 100% stenosed.  LIMA graft was visualized by angiography and is large.  The graft exhibits no disease.  SVG graft was visualized by angiography and is large.  The graft exhibits no disease.  SVG graft was visualized by angiography and is normal in caliber.  SVG graft was visualized by angiography and is large.  The graft exhibits no disease.  The left ventricular systolic function is normal.  LV end diastolic pressure is mildly elevated.  The left ventricular ejection fraction is 55-65% by visual estimate.  1. Severe 3 vessel occlusive CAD - 60% distal left main - 90% mid LAD - 100% first diagonal at prior stent site - 99% proximal LCx at prior stent site - 80% ramus intermediate. - 100% ostial RCA at prior stent site - 75% proximal PDA 2. Patent LIMA to the LAD 3. Patent SVG to ramus intermediate 4. Patent SVG to OM 5. Patent SVG to the distal RCA. 6. Good LV function 7. Mildly elevated LVEDP  Plan: Compared to prior cath in April 2018 the diagonal is now occluded. There is progression of disease in the PDA. It is unclear if this is the reason for her chest pain. She is severely hypertensive and EDP is mildly elevated. I would recommend medical management with good BP control. If she has refractory angina despite good BP and volume management we could consider PCI of the PDA but this would have to be done through the SVG and in general she has not responded well to stenting in the past with all prior stents occluded. I will increase her Coreg dose. She will need dialysis in am.    Echocardiogram: 12/2018 IMPRESSIONS   1. The left ventricle has normal systolic function with an ejection fraction of 60-65%. The cavity size was normal. There is mild  concentric left ventricular hypertrophy. Left ventricular diastolic Doppler parameters are consistent with impaired  relaxation. Elevated left ventricular end-diastolic pressure No evidence of left ventricular regional wall motion abnormalities. 2. Left atrial size was mildly dilated. 3. The mitral valve is grossly normal. Mild thickening of the mitral valve leaflet. There is mild mitral annular calcification present. Mitral valve regurgitation is mild to moderate by color flow Doppler. 4. The tricuspid valve is grossly normal. 5. The aortic valve is tricuspid. Mild thickening of the aortic valve. 6. Moderate aortic annular calcification. 7. The aortic root is normal in size and structure.   Labs/Other Tests and Data Reviewed:    EKG:  No ECG reviewed.  Recent Labs: 12/14/2018: Magnesium 2.0 05/17/2019: ALT 9 10/14/2019: BUN 57; Creatinine, Ser 10.84; Potassium 4.6; Sodium 141 10/15/2019: Hemoglobin 9.4; Platelets 203   Recent Lipid Panel Lab Results  Component Value Date/Time   CHOL 93 10/16/2017 05:39 AM   TRIG 56 10/16/2017 05:39 AM   HDL 56 10/16/2017 05:39 AM   CHOLHDL 1.7 10/16/2017 05:39 AM   LDLCALC 26 10/16/2017 05:39 AM    Wt Readings from Last 3 Encounters:  10/22/19 130 lb (59 kg)  10/17/19 133 lb (60.3 kg)  10/15/19 132 lb 0.9 oz (59.9 kg)     Objective:    Vital Signs:  BP (!) 125/44   Pulse 83   Ht 5\' 1"  (1.549 m)   Wt 130 lb (59 kg)   BMI 24.56 kg/m    VITAL SIGNS:  reviewed  ASSESSMENT & PLAN:    1. Coronary artery disease: Cardiac catheterization from January 2020 reviewed above with occlusion of diagonal and progression of PDA disease. She has episodic chest pains.Continue medical therapy with aspirin, Imdur (has not taken for last several days so I will refill), and statin.  She is no longer on carvedilol.  Brilinta discontinued during previous hospitalization due to  GI bleeding and consequent anemia.    2. Hypertension: She has a long  history of adjusting antihypertensive therapy on her own. BP is actually normal and is not antihypertensive therapy. Fluctuating BP likely secondary to renal disease.  3. End-stage renal disease: On hemodialysis.  4. Hyperlipidemia: Continue statin therapy.  5. History of paroxysmal atrial fibrillation/nonsustained ventricular tachycardia: Currently on amiodarone. Not on systemicanticoagulation due to prior GI bleed.  LFT's WNL in 05/2019. Will request recent labs from PCP. If TSH not obtained within the past 6 months, would need to be rechecked.   6.  History of GI bleed/anemia: Hemoglobin 9.4 on 10/15/2019.    COVID-19 Education: The signs and symptoms of COVID-19 were discussed with the patient and how to seek care for testing (follow up with PCP or arrange E-visit).  The importance of social distancing was discussed today.  Time:   Today, I have spent 25 minutes with the patient with telehealth technology discussing the above problems.     Medication Adjustments/Labs and Tests Ordered: Current medicines are reviewed at length with the patient today.  Concerns regarding medicines are outlined above.   Tests Ordered: No orders of the defined types were placed in this encounter.   Medication Changes: No orders of the defined types were placed in this encounter.   Follow Up:  Virtual Visit  in 6 month(s)  Signed, Kate Sable, MD  10/22/2019 4:31 PM    [facility]

## 2019-10-24 ENCOUNTER — Telehealth: Payer: Self-pay | Admitting: Urology

## 2019-10-24 NOTE — Telephone Encounter (Signed)
Pt requests a nurse return her call to discuss her issue and see if she can be seen sooner than next available.

## 2019-10-25 ENCOUNTER — Other Ambulatory Visit: Payer: Self-pay

## 2019-10-25 DIAGNOSIS — D3001 Benign neoplasm of right kidney: Secondary | ICD-10-CM

## 2019-10-25 MED ORDER — PREDNISONE 50 MG PO TABS: ORAL_TABLET | ORAL | 0 refills | Status: DC

## 2019-10-25 MED ORDER — DIPHENHYDRAMINE HCL 25 MG PO TABS: 25.0000 mg | ORAL_TABLET | Freq: Once | ORAL | 0 refills | Status: DC

## 2019-10-25 NOTE — Progress Notes (Signed)
Pt scheduled to have CT done prior to her office visit in august. CT allergy protocol medication of benadryl and prednisone sent in. Pt aware of medication orders to take prior to CT

## 2019-10-25 NOTE — Telephone Encounter (Signed)
Yes I agree. 

## 2019-10-25 NOTE — Telephone Encounter (Signed)
Pt called stating " my blood pressure keeps dropping with dialysis. Dialysis nurse said I needed to call Dr. [name]" Informed pt she needed to reach back out to her nephrologist with her blood pressure issues and dialysis. Pt voiced understanding. I told her I would let you know as well.

## 2019-10-31 ENCOUNTER — Telehealth: Payer: Self-pay | Admitting: Cardiovascular Disease

## 2019-10-31 NOTE — Telephone Encounter (Signed)
Reports her HR was ranging around 77 and SBP around 122/135/?unsure?-did not write down DBP. BP and HR is being checked with a wrist monitor. BP checked one hour ago was 122/? & HR 135. HR while on phone BP is 121/59 & HR 102 & 127. Patient says she has chest pain rated 5/10 when her HR is fast. Denies active chest pain, sob or dizziness. Patient seems to be confused about the numbers on her BP monitor and which numbers are actually the heart rate and blood pressure. Patient informed nurse that her HR was 102/127. Reports that she was taken off carvedilol at her recent hospital admission. Advised to take her monitor to dialysis tomorrow to be checked for accuracy and for teaching of how to read the monitor. Advised if she develops worsening symptoms to go to the ED for an evaluation. Verbalized understanding of plan.

## 2019-10-31 NOTE — Telephone Encounter (Signed)
Patient called worried about her BP and heart rate. Said since getting out of the hospital it has not been right

## 2019-11-22 ENCOUNTER — Observation Stay (HOSPITAL_COMMUNITY)
Admission: EM | Admit: 2019-11-22 | Discharge: 2019-11-22 | Disposition: A | Discharge status: Home / Self Care | Payer: Medicare HMO | Attending: Internal Medicine | Admitting: Internal Medicine

## 2019-11-22 ENCOUNTER — Emergency Department (HOSPITAL_COMMUNITY): Payer: Medicare HMO

## 2019-11-22 ENCOUNTER — Other Ambulatory Visit: Payer: Self-pay

## 2019-11-22 ENCOUNTER — Encounter (HOSPITAL_COMMUNITY): Payer: Self-pay

## 2019-11-22 DIAGNOSIS — Z955 Presence of coronary angioplasty implant and graft: Secondary | ICD-10-CM | POA: Insufficient documentation

## 2019-11-22 DIAGNOSIS — I132 Hypertensive heart and chronic kidney disease with heart failure and with stage 5 chronic kidney disease, or end stage renal disease: Secondary | ICD-10-CM | POA: Diagnosis not present

## 2019-11-22 DIAGNOSIS — M1990 Unspecified osteoarthritis, unspecified site: Secondary | ICD-10-CM | POA: Diagnosis not present

## 2019-11-22 DIAGNOSIS — I5032 Chronic diastolic (congestive) heart failure: Secondary | ICD-10-CM | POA: Diagnosis not present

## 2019-11-22 DIAGNOSIS — Z8673 Personal history of transient ischemic attack (TIA), and cerebral infarction without residual deficits: Secondary | ICD-10-CM | POA: Insufficient documentation

## 2019-11-22 DIAGNOSIS — Z7982 Long term (current) use of aspirin: Secondary | ICD-10-CM | POA: Insufficient documentation

## 2019-11-22 DIAGNOSIS — K219 Gastro-esophageal reflux disease without esophagitis: Secondary | ICD-10-CM | POA: Insufficient documentation

## 2019-11-22 DIAGNOSIS — R0602 Shortness of breath: Secondary | ICD-10-CM | POA: Insufficient documentation

## 2019-11-22 DIAGNOSIS — Z85038 Personal history of other malignant neoplasm of large intestine: Secondary | ICD-10-CM | POA: Insufficient documentation

## 2019-11-22 DIAGNOSIS — Z20822 Contact with and (suspected) exposure to covid-19: Secondary | ICD-10-CM | POA: Diagnosis not present

## 2019-11-22 DIAGNOSIS — Z8543 Personal history of malignant neoplasm of ovary: Secondary | ICD-10-CM | POA: Insufficient documentation

## 2019-11-22 DIAGNOSIS — N186 End stage renal disease: Secondary | ICD-10-CM | POA: Insufficient documentation

## 2019-11-22 DIAGNOSIS — R079 Chest pain, unspecified: Secondary | ICD-10-CM | POA: Diagnosis not present

## 2019-11-22 DIAGNOSIS — Z951 Presence of aortocoronary bypass graft: Secondary | ICD-10-CM | POA: Diagnosis not present

## 2019-11-22 DIAGNOSIS — I25110 Atherosclerotic heart disease of native coronary artery with unstable angina pectoris: Secondary | ICD-10-CM | POA: Insufficient documentation

## 2019-11-22 DIAGNOSIS — I200 Unstable angina: Secondary | ICD-10-CM

## 2019-11-22 DIAGNOSIS — Z992 Dependence on renal dialysis: Secondary | ICD-10-CM | POA: Diagnosis not present

## 2019-11-22 DIAGNOSIS — I252 Old myocardial infarction: Secondary | ICD-10-CM | POA: Insufficient documentation

## 2019-11-22 DIAGNOSIS — Z79899 Other long term (current) drug therapy: Secondary | ICD-10-CM | POA: Insufficient documentation

## 2019-11-22 LAB — TROPONIN I (HIGH SENSITIVITY)
Troponin I (High Sensitivity): 32 ng/L — ABNORMAL HIGH (ref <18)
Troponin I (High Sensitivity): 30 ng/L — ABNORMAL HIGH (ref <18)

## 2019-11-22 LAB — CBC
HCT: 29.5 % — ABNORMAL LOW (ref 36.0–46.0)
Hemoglobin: 8.8 g/dL — ABNORMAL LOW (ref 12.0–15.0)
MCH: 35.8 pg — ABNORMAL HIGH (ref 26.0–34.0)
MCHC: 29.8 g/dL — ABNORMAL LOW (ref 30.0–36.0)
MCV: 119.9 fL — ABNORMAL HIGH (ref 80.0–100.0)
Platelets: 187 10*3/uL (ref 150–400)
RBC: 2.46 MIL/uL — ABNORMAL LOW (ref 3.87–5.11)
RDW: 17.3 % — ABNORMAL HIGH (ref 11.5–15.5)
WBC: 6.8 10*3/uL (ref 4.0–10.5)
nRBC: 0 % (ref 0.0–0.2)

## 2019-11-22 LAB — BASIC METABOLIC PANEL
Anion gap: 13 (ref 5–15)
BUN: 40 mg/dL — ABNORMAL HIGH (ref 8–23)
CO2: 28 mmol/L (ref 22–32)
Calcium: 8.9 mg/dL (ref 8.9–10.3)
Chloride: 100 mmol/L (ref 98–111)
Creatinine, Ser: 8.08 mg/dL — ABNORMAL HIGH (ref 0.44–1.00)
GFR calc Af Amer: 5 mL/min — ABNORMAL LOW (ref >60)
GFR calc non Af Amer: 4 mL/min — ABNORMAL LOW (ref >60)
Glucose, Bld: 100 mg/dL — ABNORMAL HIGH (ref 70–99)
Potassium: 5.5 mmol/L — ABNORMAL HIGH (ref 3.5–5.1)
Sodium: 141 mmol/L (ref 135–145)

## 2019-11-22 LAB — SARS CORONAVIRUS 2 BY RT PCR (HOSPITAL ORDER, PERFORMED IN ~~LOC~~ HOSPITAL LAB): SARS Coronavirus 2: NEGATIVE

## 2019-11-22 IMAGING — DX DG CHEST 1V PORT
1 series · 1 of 1 positions shown · non-contrast
Comparison: [DATE]

CLINICAL DATA: Chest pain

EXAM:
PORTABLE CHEST 1 VIEW

[chest ap]
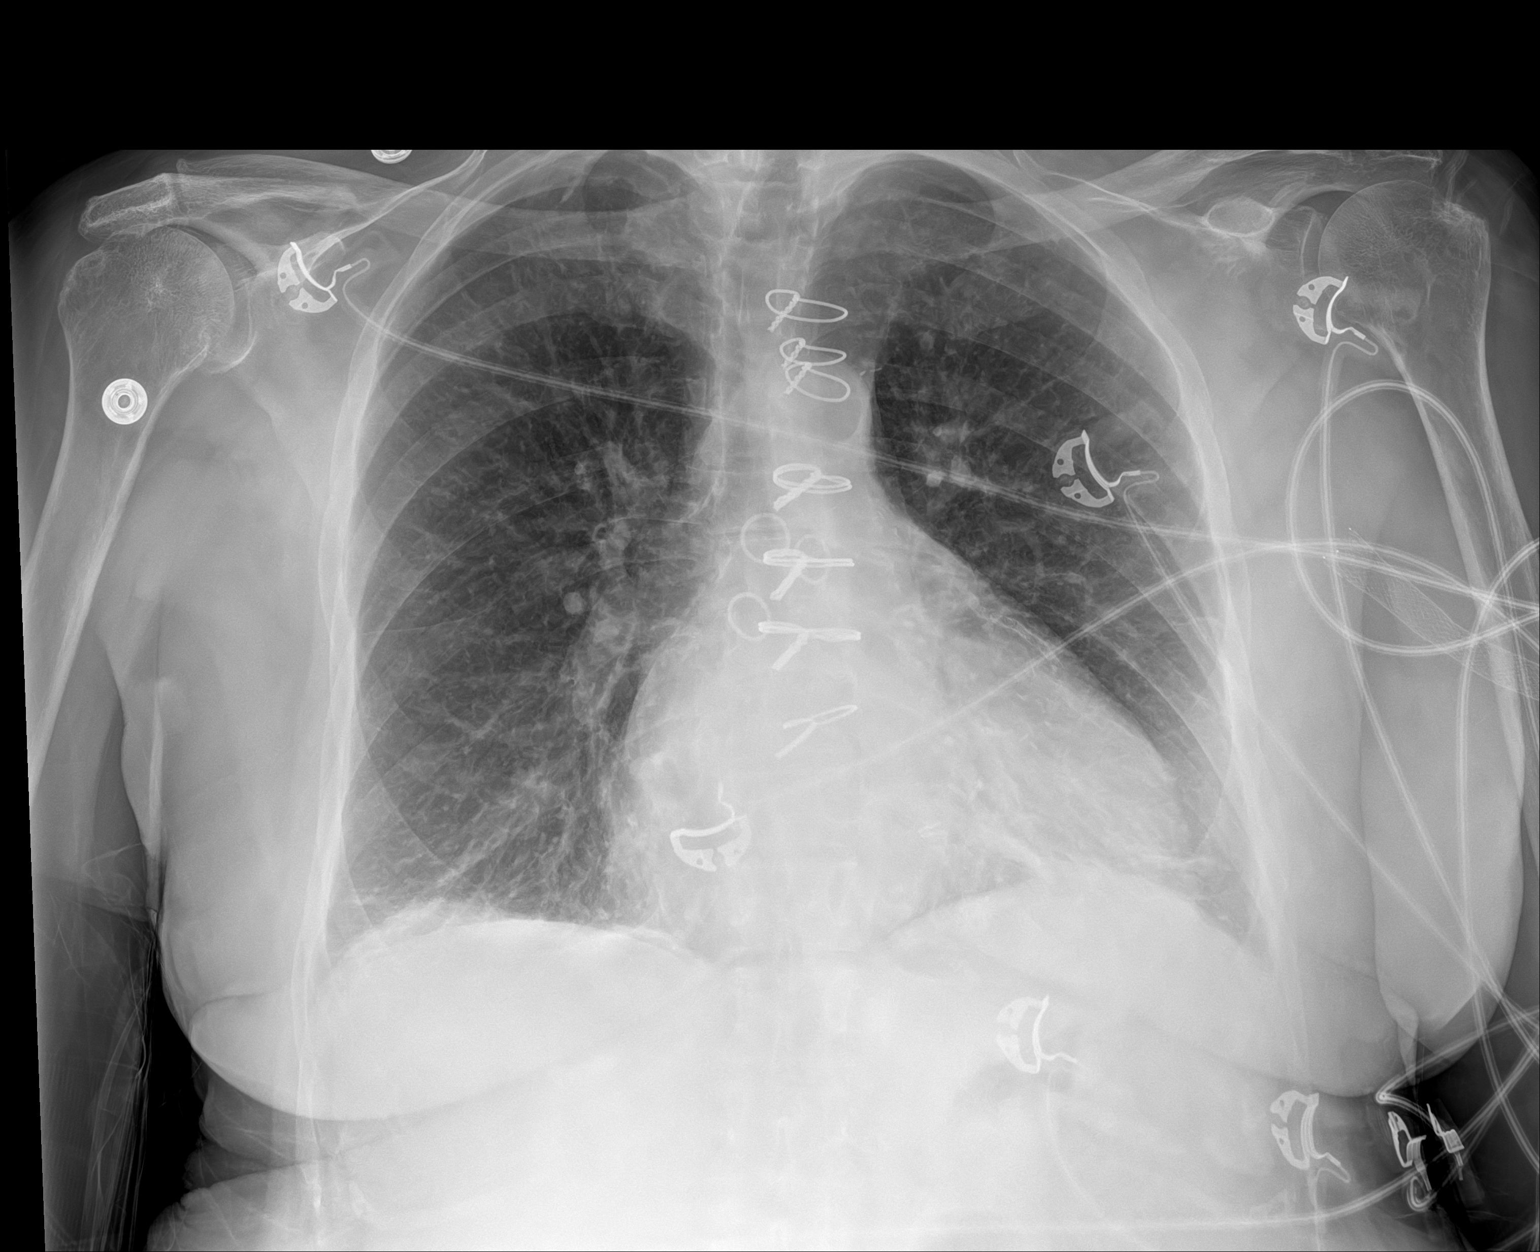

[1 of 1 positions shown; findings below may reference images not displayed]

FINDINGS: There is mild cardiomegaly. Aortic knob calcifications. Overlying
median sternotomy wires present. There is mild increased
interstitial/streaky marking seen at both lung bases. No large
airspace consolidation or pleural effusion.
IMPRESSION: Mildly increased streaky airspace opacity seen at both lung bases
which may be due to atelectasis and/or early infectious etiology.

## 2019-11-22 MED ORDER — ONDANSETRON HCL 4 MG/2ML IJ SOLN
4.0000 mg | Freq: Once | INTRAMUSCULAR | Status: AC
  Administered 2019-11-22: 4 mg via INTRAVENOUS
  Filled 2019-11-22: qty 2

## 2019-11-22 MED ORDER — NITROGLYCERIN 0.4 MG SL SUBL: SUBLINGUAL_TABLET | SUBLINGUAL | 2 refills | Status: DC

## 2019-11-22 MED ORDER — SODIUM ZIRCONIUM CYCLOSILICATE 5 G PO PACK
10.0000 g | PACK | Freq: Once | ORAL | Status: AC
  Administered 2019-11-22: 10 g via ORAL
  Filled 2019-11-22: qty 2

## 2019-11-22 MED ORDER — ASPIRIN 81 MG PO CHEW
243.0000 mg | CHEWABLE_TABLET | Freq: Once | ORAL | Status: AC
  Administered 2019-11-22: 243 mg via ORAL
  Filled 2019-11-22: qty 3

## 2019-11-22 NOTE — Discharge Instructions (Signed)
Continue the rest of your medications without change until you are seen by your cardiologist Dr. Bronson Ing hopefully in a week or so --- 1)Very low-salt diet advised 2)Weigh yourself daily, call if you gain more than 3 pounds in 1 day or more than 5 pounds in 1 week as your Hemodialysis  may need to be adjusted 3)Limit your Fluid  intake to no more than 50 ounces (1.5 Liters) per day 4)Avoid ibuprofen/Advil/Aleve/Motrin/Goody Powders/Naproxen/BC powders/Meloxicam/Diclofenac/Indomethacin and other Nonsteroidal anti-inflammatory medications as these will make you more likely to bleed and can cause stomach ulcers, can also cause Kidney problems.  5) okay to use nitroglycerin under the tongue as needed for chest pains 6) continue the rest of your medications without change until you are seen by your cardiologist Dr. Bronson Ing hopefully in a week or so

## 2019-11-22 NOTE — Progress Notes (Signed)
This patient is well-known to me from the outpatient setting.  She presented with chest pain.  ECG is unchanged.  Troponins are nonspecifically elevated in the context of chronic kidney disease and consequent anemia.  She has a history of chronic chest pains.  Please refer to my last evaluation in April 2021 for further details regarding coronary anatomy.  She can follow-up with me as previously scheduled.  I do not feel she requires admission.

## 2019-11-22 NOTE — ED Provider Notes (Signed)
[facility] Emergency Department Provider Note MRN:  [id number]  Arrival date & time: 11/22/19     Chief Complaint   Chest Pain   History of Present Illness   Shawna [name] is a 79 y.o. year-old female with a history of CAD, CABG, ESRD presenting to the ED with chief complaint of chest pain.  Location: Left chest Duration: 4 hours Onset: Sudden Timing: Constant Description: Squeezing Severity: Initially moderate to severe, now mild Exacerbating/Alleviating Factors: Improved with nitroglycerin at home Associated Symptoms: Shortness of breath Pertinent Negatives: Denies dizziness, no diaphoresis, no nausea, no vomiting, no headache, no abdominal pain, no leg pain or swelling   Review of Systems  A complete 10 system review of systems was obtained and all systems are negative except as noted in the HPI and PMH.   Patient's Health History    Past Medical History:  Diagnosis Date  . Acute on chronic respiratory failure with hypoxia ([place]) 10/10/2016  . Anxiety   . Arthritis   . AVM (arteriovenous malformation) of colon   . CAD (coronary artery disease)    a. s/p CABG in 2013 b. most recent DES to D1 in 10/2016 with cath showing patent LIMA-LAD, SVG-RI, SVG-Mrg and SVG-RCA  c. NST in 02/2018 showing scar with no significant ischemia  . Carotid artery disease ([place])    a. 03-70% LICA, 10/8887   . Chronic bronchitis ([place])   . Chronic diastolic CHF (congestive heart failure) ([place])    a. 02/2012 Echo EF 60-65%, nl wall motion, Gr 1 DD, mod MR  . Colon cancer ([place]) 1992  . Esophageal stricture   . ESRD on hemodialysis ([place])    ESRD due to HTN, started dialysis 2011 and gets HD at [facility] with Dr Hinda Lenis on MWF schedule.  Access is LUA AVF as of Sept 2014.   Marland Kitchen GERD (gastroesophageal reflux disease)   . High cholesterol 12/2011  . History of blood transfusion 07/2011; 12/2011; 01/2012 X 2; 04/2012  . History of gout   . History of lower GI bleeding   .  Hypertension   . Iron deficiency anemia   . Mitral regurgitation    a. Moderate by echo, 02/2012  . Myocardial infarction ([place])   . Ovarian cancer ([place]) 1992  . Pneumonia ~ 2009  . PUD (peptic ulcer disease)   . TIA (transient ischemic attack)     Past Surgical History:  Procedure Laterality Date  . A/V SHUNTOGRAM Left 03/19/2019   Procedure: A/V SHUNTOGRAM;  Surgeon: Katha Cabal, MD;  Location: [place] [facility];  Service: Cardiovascular;  Laterality: Left;  . ABDOMINAL HYSTERECTOMY  1992  . APPENDECTOMY  06/1990  . AV FISTULA PLACEMENT  07/2009   left upper arm  . AV FISTULA PLACEMENT Right 09/06/2016   Procedure: RIGHT FOREARM ARTERIOVENOUS (AV) GRAFT;  Surgeon: Elam Dutch, MD;  Location: [facility] OR;  Service: Vascular;  Laterality: Right;  . AV FISTULA PLACEMENT N/A 02/24/2017   Procedure: INSERTION OF ARTERIOVENOUS (AV) GORE-TEX GRAFT ARM (BRACHIAL AXILLARY);  Surgeon: Katha Cabal, MD;  Location: [facility] ORS;  Service: Vascular;  Laterality: N/A;  . [place] Right 09/06/2016   Procedure: REMOVAL OF Right Arm ARTERIOVENOUS GORETEX GRAFT and Vein Patch angioplasty of brachial artery;  Surgeon: Angelia Mould, MD;  Location: [place];  Service: Vascular;  Laterality: Right;  . BIOPSY  09/26/2019   Procedure: BIOPSY;  Surgeon: Rogene Houston, MD;  [facility];  Service: Endoscopy;;  .  COLON RESECTION  1992  . COLON SURGERY    . COLONOSCOPY N/A 03/09/2019   Procedure: COLONOSCOPY;  Surgeon: Rogene Houston, MD;  [facility];  Service: Endoscopy;  Laterality: N/A;  . CORONARY ANGIOPLASTY WITH STENT PLACEMENT  12/15/11   "2"  . CORONARY ANGIOPLASTY WITH STENT PLACEMENT  y/2013   "1; makes total of 3" (05/02/2012)  . CORONARY ARTERY BYPASS GRAFT  06/13/2012   Procedure: CORONARY ARTERY BYPASS GRAFTING (CABG);  Surgeon: Grace Isaac, MD;  Location: [place];  Service: Open Heart Surgery;  Laterality: N/A;  cabg x four;  using left internal  mammary artery, and left leg greater saphenous vein harvested endoscopically  . CORONARY STENT INTERVENTION N/A 10/13/2016   Procedure: Coronary Stent Intervention;  Surgeon: Troy Sine, MD;  Location: [place] [facility];  Service: Cardiovascular;  Laterality: N/A;  . DIALYSIS/PERMA CATHETER REMOVAL N/A 04/18/2017   Procedure: DIALYSIS/PERMA CATHETER REMOVAL;  Surgeon: Katha Cabal, MD;  Location: [place] [facility];  Service: Cardiovascular;  Laterality: N/A;  . DILATION AND CURETTAGE OF UTERUS    . ESOPHAGOGASTRODUODENOSCOPY  01/20/2012   Procedure: ESOPHAGOGASTRODUODENOSCOPY (EGD);  Surgeon: Ladene Artist, MD,FACG;  Location: [name] ENDOSCOPY;  Service: Endoscopy;  Laterality: N/A;  . ESOPHAGOGASTRODUODENOSCOPY N/A 03/26/2013   Procedure: ESOPHAGOGASTRODUODENOSCOPY (EGD);  Surgeon: Irene Shipper, MD;  Location: [facility] ENDOSCOPY;  Service: Endoscopy;  Laterality: N/A;  . ESOPHAGOGASTRODUODENOSCOPY N/A 04/30/2015   Procedure: ESOPHAGOGASTRODUODENOSCOPY (EGD);  Surgeon: Rogene Houston, MD;  [facility];  Service: Endoscopy;  Laterality: N/A;  1pm - moved to 10/20 @ 1:10  . ESOPHAGOGASTRODUODENOSCOPY N/A 07/29/2016   Procedure: ESOPHAGOGASTRODUODENOSCOPY (EGD);  Surgeon: Manus Gunning, MD;  Location: [place];  Service: Gastroenterology;  Laterality: N/A;  enteroscopy  . ESOPHAGOGASTRODUODENOSCOPY N/A 09/26/2019   Procedure: ESOPHAGOGASTRODUODENOSCOPY (EGD);  Surgeon: Rogene Houston, MD;  [facility];  Service: Endoscopy;  Laterality: N/A;  1250  . GIVENS CAPSULE STUDY N/A 03/07/2019   Procedure: GIVENS CAPSULE STUDY;  Surgeon: Rogene Houston, MD;  [facility];  Service: Endoscopy;  Laterality: N/A;  7:30  . INTRAOPERATIVE TRANSESOPHAGEAL ECHOCARDIOGRAM  06/13/2012   Procedure: INTRAOPERATIVE TRANSESOPHAGEAL ECHOCARDIOGRAM;  Surgeon: Grace Isaac, MD;  Location: [place];  Service: Open Heart Surgery;  Laterality: N/A;  . IR GENERIC HISTORICAL   07/26/2016   IR FLUORO GUIDE CV LINE RIGHT 07/26/2016 Greggory Keen, MD [facility]  . IR GENERIC HISTORICAL  07/26/2016   IR US GUIDE VASC ACCESS RIGHT 07/26/2016 Greggory Keen, MD [facility]  . IR GENERIC HISTORICAL  08/02/2016   IR US GUIDE VASC ACCESS RIGHT 08/02/2016 Greggory Keen, MD [facility]  . IR GENERIC HISTORICAL  08/02/2016   IR FLUORO GUIDE CV LINE RIGHT 08/02/2016 Greggory Keen, MD [facility]  . IR RADIOLOGY PERIPHERAL GUIDED IV START  03/28/2017  . IR US GUIDE VASC ACCESS RIGHT  03/28/2017  . LEFT HEART CATH AND CORONARY ANGIOGRAPHY N/A 09/20/2016   Procedure: Left Heart Cath and Coronary Angiography;  Surgeon: [name], MD;  Location: [place] [facility];  Service: Cardiovascular;  Laterality: N/A;  . LEFT HEART CATH AND CORS/GRAFTS ANGIOGRAPHY N/A 10/13/2016   Procedure: Left Heart Cath and Cors/Grafts Angiography;  Surgeon: Troy Sine, MD;  Location: [place] [facility] LAB;  Service: Cardiovascular;  Laterality: N/A;  . LEFT HEART CATH AND CORS/GRAFTS ANGIOGRAPHY N/A 07/13/2018   Procedure: LEFT HEART CATH AND CORS/GRAFTS ANGIOGRAPHY;  Surgeon: Martinique, Peter M, MD;  Location: [place] [facility];  Service: Cardiovascular;  Laterality: N/A;  . LEFT HEART CATHETERIZATION WITH CORONARY ANGIOGRAM N/A 12/15/2011   Procedure: LEFT HEART CATHETERIZATION WITH CORONARY ANGIOGRAM;  Surgeon: Burnell Blanks, MD;  Location: [facility] CATH LAB;  Service: Cardiovascular;  Laterality: N/A;  . LEFT HEART CATHETERIZATION WITH CORONARY ANGIOGRAM N/A 01/10/2012   Procedure: LEFT HEART CATHETERIZATION WITH CORONARY ANGIOGRAM;  Surgeon: Peter M Martinique, MD;  Location: [facility] CATH LAB;  Service: Cardiovascular;  Laterality: N/A;  . LEFT HEART CATHETERIZATION WITH CORONARY ANGIOGRAM N/A 06/08/2012   Procedure: LEFT HEART CATHETERIZATION WITH CORONARY ANGIOGRAM;  Surgeon: Burnell Blanks, MD;  Location: [facility] CATH LAB;  Service: Cardiovascular;  Laterality: N/A;  . LEFT HEART CATHETERIZATION WITH  CORONARY/GRAFT ANGIOGRAM N/A 12/10/2013   Procedure: LEFT HEART CATHETERIZATION WITH Beatrix Fetters;  Surgeon: [name], MD;  Location: [facility] CATH LAB;  Service: Cardiovascular;  Laterality: N/A;  . OVARY SURGERY     ovarian cancer  . POLYPECTOMY  03/09/2019   Procedure: POLYPECTOMY;  Surgeon: Rogene Houston, MD;  [facility];  Service: Endoscopy;;  cecal   . POLYPECTOMY N/A 09/26/2019   Procedure: DUODENAL POLYPECTOMY;  Surgeon: Rogene Houston, MD;  [facility];  Service: Endoscopy;  Laterality: N/A;  . REVISION OF ARTERIOVENOUS GORETEX GRAFT N/A 02/24/2017   Procedure: REVISION OF ARTERIOVENOUS GORETEX GRAFT (RESECTION);  Surgeon: Katha Cabal, MD;  Location: [facility] ORS;  Service: Vascular;  Laterality: N/A;  [place] N/A 10/15/2013   Procedure: Fistulogram;  Surgeon: Serafina Mitchell, MD;  Location: [facility] CATH LAB;  Service: Cardiovascular;  Laterality: N/A;  . THROMBECTOMY / ARTERIOVENOUS GRAFT REVISION  2011   left upper arm  . TUBAL LIGATION  1980's  . UPPER EXTREMITY ANGIOGRAPHY Bilateral 12/06/2016   Procedure: Upper Extremity Angiography;  Surgeon: Katha Cabal, MD;  Location: [place] [facility];  Service: Cardiovascular;  Laterality: Bilateral;  . UPPER EXTREMITY INTERVENTION Left 06/06/2017   Procedure: UPPER EXTREMITY INTERVENTION;  Surgeon: Katha Cabal, MD;  Location: [place] [facility];  Service: Cardiovascular;  Laterality: Left;    Family History  Problem Relation Age of Onset  . Heart disease Mother        Heart Disease before age 34  . Hyperlipidemia Mother   . Hypertension Mother   . Diabetes Mother   . Heart attack Mother   . Heart disease Father        Heart Disease before age 29  . Hyperlipidemia Father   . Hypertension Father   . Diabetes Father   . Diabetes Sister   . Hypertension Sister   . Diabetes Brother   . Hyperlipidemia Brother   . Heart attack Brother   . Hypertension Sister   . Heart  attack Brother   . Colon cancer Child 32  . Other Other        noncontributory for early CAD  . Esophageal cancer Neg Hx   . Liver disease Neg Hx   . Kidney disease Neg Hx   . Colon polyps Neg Hx     Social History   Socioeconomic History  . Marital status: Married    Spouse name: [name]  . Number of children: Not on file  . Years of education: Not on file  . Highest education level: Not on file  Occupational History  . Not on file  Tobacco Use  . Smoking status: Never Smoker  . Smokeless tobacco: Never Used  Substance and Sexual  Activity  . Alcohol use: No    Alcohol/week: 0.0 standard drinks  . Drug use: No  . Sexual activity: Yes    Birth control/protection: Surgical  Other Topics Concern  . Not on file  Social History Narrative   Lives in [place], [place] with husband.  Dialysis pt - [facility].   Social Determinants of Health   Financial Resource Strain: Low Risk   . Difficulty of Paying Living Expenses: Not very hard  Food Insecurity: No Food Insecurity  . Worried About Charity fundraiser in the Last Year: Never true  . Ran Out of Food in the Last Year: Never true  Transportation Needs: No Transportation Needs  . Lack of Transportation (Medical): No  . Lack of Transportation (Non-Medical): No  Physical Activity: Unknown  . Days of Exercise per Week: Patient refused  . Minutes of Exercise per Session: Patient refused  Stress: No Stress Concern Present  . Feeling of Stress : Only a little  Social Connections: Unknown  . Frequency of Communication with Friends and Family: Patient refused  . Frequency of Social Gatherings with Friends and Family: Patient refused  . Attends Religious Services: Patient refused  . Active Member of Clubs or Organizations: Patient refused  . Attends Archivist Meetings: Patient refused  . Marital Status: Patient refused  Intimate Partner Violence: Not At Risk  . Fear of Current or Ex-Partner: No  . Emotionally Abused: No  .  Physically Abused: No  . Sexually Abused: No     Physical Exam   Vitals:   11/22/19 0400 11/22/19 0500  BP: (!) 154/62 (!) 169/63  Pulse: 68 69  Resp: 14 14  Temp:    SpO2: 97% 98%    CONSTITUTIONAL: Well-appearing, NAD NEURO:  Alert and oriented x 3, no focal deficits EYES:  eyes equal and reactive ENT/NECK:  no LAD, no JVD CARDIO: Regular rate, well-perfused, normal S1 and S2 PULM:  CTAB no wheezing or rhonchi GI/GU:  normal bowel sounds, non-distended, non-tender MSK/SPINE:  No gross deformities, no edema SKIN:  no rash, atraumatic PSYCH:  Appropriate speech and behavior  *Additional and/or pertinent findings included in MDM below  Diagnostic and Interventional Summary    EKG Interpretation  Date/Time:  Friday Nov 22 2019 02:10:00 EDT Ventricular Rate:  93 PR Interval:    QRS Duration: 112 QT Interval:  380 QTC Calculation: 473 R Axis:   54 Text Interpretation: Sinus rhythm Anterior infarct, old Repol abnrm suggests ischemia, diffuse leads Baseline wander in lead(s) V2 Confirmed by Gerlene Fee [phone]) on 11/22/2019 2:19:50 AM      Labs Reviewed  CBC - Abnormal; Notable for the following components:      Result Value   RBC 2.46 (*)    Hemoglobin 8.8 (*)    HCT 29.5 (*)    MCV 119.9 (*)    MCH 35.8 (*)    MCHC 29.8 (*)    RDW 17.3 (*)    All other components within normal limits  BASIC METABOLIC PANEL - Abnormal; Notable for the following components:   Potassium 5.5 (*)    Glucose, Bld 100 (*)    BUN 40 (*)    Creatinine, Ser 8.08 (*)    GFR calc non Af Amer 4 (*)    GFR calc Af Amer 5 (*)    All other components within normal limits  TROPONIN I (HIGH SENSITIVITY) - Abnormal; Notable for the following components:   Troponin I (High Sensitivity) 32 (*)  All other components within normal limits  TROPONIN I (HIGH SENSITIVITY) - Abnormal; Notable for the following components:   Troponin I (High Sensitivity) 30 (*)    All other components within  normal limits  SARS CORONAVIRUS 2 BY RT PCR (HOSPITAL ORDER, [place] [facility])    DG Chest Port 1 View  Final Result      Medications  aspirin chewable tablet 243 mg (243 mg Oral Given 11/22/19 0234)  sodium zirconium cyclosilicate (LOKELMA) packet 10 g (10 g Oral Given 11/22/19 0511)  ondansetron (ZOFRAN) injection 4 mg (4 mg Intravenous Given 11/22/19 0511)     Procedures  /  Critical Care .Critical Care Performed by: Maudie Flakes, MD Authorized by: Maudie Flakes, MD   Critical care provider statement:    Critical care time (minutes):  33   Critical care was necessary to treat or prevent imminent or life-threatening deterioration of the following conditions: Unstable angina.   Critical care was time spent personally by me on the following activities:  Discussions with consultants, evaluation of patient's response to treatment, examination of patient, ordering and performing treatments and interventions, ordering and review of laboratory studies, ordering and review of radiographic studies, pulse oximetry, re-evaluation of patient's condition, obtaining history from patient or surrogate and review of old charts    ED Course and Medical Decision Making  I have reviewed the triage vital signs, the nursing notes, and pertinent available records from the EMR.  Listed above are laboratory and imaging tests that I personally ordered, reviewed, and interpreted and then considered in my medical decision making (see below for details).      Concern for cardiac chest pain given patient's extensive cardiac history, advanced age, multiple risk factors.  EKG is without obvious ischemic changes, awaiting troponin.  Anticipating admission.  Troponin mildly elevated and flat, however given patient's description of pain and her known CAD will request chest pain rule out admission.  Barth Kirks. Sedonia Small, MD [place] [email or web address]  Final Clinical Impressions(s) / ED Diagnoses     ICD-10-CM   1. Chest pain, unspecified type  R07.9   2. Unstable angina (HCC)  I20.0     ED Discharge Orders    None       Discharge Instructions Discussed with and Provided to Patient:   Discharge Instructions   None       Maudie Flakes, MD 11/22/19 [phone]

## 2019-11-22 NOTE — Progress Notes (Signed)
  79 Yo F with HTN, chronic Anemia, ESRD on HD with Prior CABG and multiple stents presenting with CP to ED on 11/22/19  LHC 07/13/2018-  Ost RPDA to RPDA lesion is 75% stenosed.  Mid LM to Dist LM lesion is 60% stenosed.  Ost Ramus to Ramus lesion is 80% stenosed.  Ost Cx to Mid Cx lesion is 99% stenosed.  Prox LAD lesion is 90% stenosed.  Ost 1st Diag to 1st Diag lesion is 100% stenosed.  Ost RCA to Prox RCA lesion is 100% stenosed.  LIMA graft was visualized by angiography and is large.  The graft exhibits no disease.  SVG graft was visualized by angiography and is large.  The graft exhibits no disease.  SVG graft was visualized by angiography and is normal in caliber.  SVG graft was visualized by angiography and is large.  The graft exhibits no disease.  The left ventricular systolic function is normal.  LV end diastolic pressure is mildly elevated.  The left ventricular ejection fraction is 55-65% by visual estimate.   1. Severe 3 vessel occlusive CAD    - 60% distal left main    - 90% mid LAD    - 100% first diagonal at prior stent site    - 99% proximal LCx at prior stent site    - 80% ramus intermediate.    - 100% ostial RCA at prior stent site    - 75% proximal PDA 2. Patent LIMA to the LAD 3. Patent SVG to ramus intermediate 4. Patent SVG to OM 5. Patent SVG to the distal RCA. 6. Good LV function 7. Mildly elevated LVEDP  Plan: Compared to prior cath in April 2018 the diagonal is now occluded. There is progression of disease in the PDA. It is unclear if this is the reason for her chest pain. She is severely hypertensive and EDP is mildly elevated. I would recommend medical management with good BP control. If she has refractory angina despite good BP and volume management we could consider PCI of the PDA but this would have to be done through the SVG and in general she has not responded well to stenting in the past with all prior stents occluded.

## 2019-11-22 NOTE — Consult Note (Addendum)
Patient Demographics:    Shawna [name], is a 79 y.o. female  MRN: [id number]   DOB - 1940/07/16  Admit Date - 11/22/2019  Outpatient Primary MD for the patient is [name], MD   Assessment & Plan:    Active Problems:   Chest pain   1) chronic/recurrent chest pains in a patient with known CAD with prior CABG and multiple stents----Case reviewed with patient's primary cardiologist Dr. Bronson Ing -Please see note from Dr. Bronson Ing today -She  is currently chest pain-free after nitro, patient has ruled out for ACS with serial troponins and EKG-  Disposition--discharge home as advised by patient's primary cardiologist Dr. Bronson Ing Continue the rest of your medications without change until you are seen by your cardiologist Dr. Bronson Ing hopefully in a week or so --- 1)Very low-salt diet advised 2)Weigh yourself daily, call if you gain more than 3 pounds in 1 day or more than 5 pounds in 1 week as your Hemodialysis  may need to be adjusted 3)Limit your Fluid  intake to no more than 50 ounces (1.5 Liters) per day 4)Avoid ibuprofen/Advil/Aleve/Motrin/Goody Powders/Naproxen/BC powders/Meloxicam/Diclofenac/Indomethacin and other Nonsteroidal anti-inflammatory medications as these will make you more likely to bleed and can cause stomach ulcers, can also cause Kidney problems.  5) okay to use nitroglycerin under the tongue as needed for chest pains 6) continue the rest of your medications without change until you are seen by your cardiologist Dr. Bronson Ing hopefully in a week or so  2)ESRD--- HD Monday Wednesdays and Fridays through left arm AV fistula -Patient has dialysis appointment today at 10:30 AM in [place] -Patient's husband will drive directly to hemodialysis center after the patient can get hemodialysis prior  to going home today  3) chronic anemia of CKD--ESA/EPO management per nephrologist  4)HTN--BP improved,  continue PTA meds  With History of - Reviewed by me  Past Medical History:  Diagnosis Date  . Acute on chronic respiratory failure with hypoxia ([place]) 10/10/2016  . Anxiety   . Arthritis   . AVM (arteriovenous malformation) of colon   . CAD (coronary artery disease)    a. s/p CABG in 2013 b. most recent DES to D1 in 10/2016 with cath showing patent LIMA-LAD, SVG-RI, SVG-Mrg and SVG-RCA  c. NST in 02/2018 showing scar with no significant ischemia  . Carotid artery disease ([place])    a. 99-83% LICA, 09/8248   . Chronic bronchitis ([place])   . Chronic diastolic CHF (congestive heart failure) ([place])    a.  02/2012 Echo EF 60-65%, nl wall motion, Gr 1 DD, mod MR  . Colon cancer ([place]) 1992  . Esophageal stricture   . ESRD on hemodialysis ([place])    ESRD due to HTN, started dialysis 2011 and gets HD at [facility] with Dr Hinda Lenis on MWF schedule.  Access is LUA AVF as of Sept 2014.   Marland Kitchen GERD (gastroesophageal reflux disease)   . High cholesterol 12/2011  . History of blood transfusion 07/2011; 12/2011; 01/2012 X 2; 04/2012  . History of gout   . History of lower GI bleeding   . Hypertension   . Iron deficiency anemia   . Mitral regurgitation    a. Moderate by echo, 02/2012  . Myocardial infarction ([place])   . Ovarian cancer ([place]) 1992  . Pneumonia ~ 2009  . PUD (peptic ulcer disease)   . TIA (transient ischemic attack)       Past Surgical History:  Procedure Laterality Date  . A/V SHUNTOGRAM Left 03/19/2019   Procedure: A/V SHUNTOGRAM;  Surgeon: Katha Cabal, MD;  Location: [place] [facility];  Service: Cardiovascular;  Laterality: Left;  . ABDOMINAL HYSTERECTOMY  1992  . APPENDECTOMY  06/1990  . AV FISTULA PLACEMENT  07/2009   left upper arm  . AV FISTULA PLACEMENT Right 09/06/2016   Procedure: RIGHT FOREARM ARTERIOVENOUS (AV) GRAFT;  Surgeon: Elam Dutch, MD;  Location[facility] OR;   Service: Vascular;  Laterality: Right;  . AV FISTULA PLACEMENT N/A 02/24/2017   Procedure: INSERTION OF ARTERIOVENOUS (AV) GORE-TEX GRAFT ARM (BRACHIAL AXILLARY);  Surgeon: Katha Cabal, MD;  Location: [facility] ORS;  Service: Vascular;  Laterality: N/A;  . [place] Right 09/06/2016   Procedure: REMOVAL OF Right Arm ARTERIOVENOUS GORETEX GRAFT and Vein Patch angioplasty of brachial artery;  Surgeon: Angelia Mould, MD;  Location: [place];  Service: Vascular;  Laterality: Right;  . BIOPSY  09/26/2019   Procedure: BIOPSY;  Surgeon: Rogene Houston, MD;  [facility];  Service: Endoscopy;;  . COLON RESECTION  1992  . COLON SURGERY    . COLONOSCOPY N/A 03/09/2019   Procedure: COLONOSCOPY;  Surgeon: Rogene Houston, MD;  [facility];  Service: Endoscopy;  Laterality: N/A;  . CORONARY ANGIOPLASTY WITH STENT PLACEMENT  12/15/11   "2"  . CORONARY ANGIOPLASTY WITH STENT PLACEMENT  y/2013   "1; makes total of 3" (05/02/2012)  . CORONARY ARTERY BYPASS GRAFT  06/13/2012   Procedure: CORONARY ARTERY BYPASS GRAFTING (CABG);  Surgeon: Grace Isaac, MD;  Location: [place];  Service: Open Heart Surgery;  Laterality: N/A;  cabg x four;  using left internal mammary artery, and left leg greater saphenous vein harvested endoscopically  . CORONARY STENT INTERVENTION N/A 10/13/2016   Procedure: Coronary Stent Intervention;  Surgeon: Troy Sine, MD;  Location: [place] [facility];  Service: Cardiovascular;  Laterality: N/A;  . DIALYSIS/PERMA CATHETER REMOVAL N/A 04/18/2017   Procedure: DIALYSIS/PERMA CATHETER REMOVAL;  Surgeon: Katha Cabal, MD;  Location: [place] [facility];  Service: Cardiovascular;  Laterality: N/A;  . DILATION AND CURETTAGE OF UTERUS    . ESOPHAGOGASTRODUODENOSCOPY  01/20/2012   Procedure: ESOPHAGOGASTRODUODENOSCOPY (EGD);  Surgeon: Ladene Artist, MD,FACG;  Location[facility] ENDOSCOPY;  Service: Endoscopy;  Laterality: N/A;  . ESOPHAGOGASTRODUODENOSCOPY N/A  03/26/2013   Procedure: ESOPHAGOGASTRODUODENOSCOPY (EGD);  Surgeon: Irene Shipper, MD;  Location: [facility] ENDOSCOPY;  Service: Endoscopy;  Laterality: N/A;  . ESOPHAGOGASTRODUODENOSCOPY N/A 04/30/2015   Procedure: ESOPHAGOGASTRODUODENOSCOPY (EGD);  Surgeon:  Rogene Houston, MD;  [facility];  Service: Endoscopy;  Laterality: N/A;  1pm - moved to 10/20 @ 1:10  . ESOPHAGOGASTRODUODENOSCOPY N/A 07/29/2016   Procedure: ESOPHAGOGASTRODUODENOSCOPY (EGD);  Surgeon: Manus Gunning, MD;  Location: [place];  Service: Gastroenterology;  Laterality: N/A;  enteroscopy  . ESOPHAGOGASTRODUODENOSCOPY N/A 09/26/2019   Procedure: ESOPHAGOGASTRODUODENOSCOPY (EGD);  Surgeon: Rogene Houston, MD;  [facility];  Service: Endoscopy;  Laterality: N/A;  1250  . GIVENS CAPSULE STUDY N/A 03/07/2019   Procedure: GIVENS CAPSULE STUDY;  Surgeon: Rogene Houston, MD;  [facility];  Service: Endoscopy;  Laterality: N/A;  7:30  . INTRAOPERATIVE TRANSESOPHAGEAL ECHOCARDIOGRAM  06/13/2012   Procedure: INTRAOPERATIVE TRANSESOPHAGEAL ECHOCARDIOGRAM;  Surgeon: Grace Isaac, MD;  Location: [place];  Service: Open Heart Surgery;  Laterality: N/A;  . IR GENERIC HISTORICAL  07/26/2016   IR FLUORO GUIDE CV LINE RIGHT 07/26/2016 Greggory Keen, MD [facility]  . IR GENERIC HISTORICAL  07/26/2016   IR US GUIDE VASC ACCESS RIGHT 07/26/2016 Greggory Keen, MD [facility]  . IR GENERIC HISTORICAL  08/02/2016   IR US GUIDE VASC ACCESS RIGHT 08/02/2016 Greggory Keen, MD [facility]  . IR GENERIC HISTORICAL  08/02/2016   IR FLUORO GUIDE CV LINE RIGHT 08/02/2016 Greggory Keen, MD [facility]  . IR RADIOLOGY PERIPHERAL GUIDED IV START  03/28/2017  . IR US GUIDE VASC ACCESS RIGHT  03/28/2017  . LEFT HEART CATH AND CORONARY ANGIOGRAPHY N/A 09/20/2016   Procedure: Left Heart Cath and Coronary Angiography;  Surgeon: [name], MD;  Location: [place] [facility];  Service: Cardiovascular;  Laterality: N/A;  .  LEFT HEART CATH AND CORS/GRAFTS ANGIOGRAPHY N/A 10/13/2016   Procedure: Left Heart Cath and Cors/Grafts Angiography;  Surgeon: Troy Sine, MD;  Location: [place] [facility];  Service: Cardiovascular;  Laterality: N/A;  . LEFT HEART CATH AND CORS/GRAFTS ANGIOGRAPHY N/A 07/13/2018   Procedure: LEFT HEART CATH AND CORS/GRAFTS ANGIOGRAPHY;  Surgeon: Martinique, Peter M, MD;  Location: [place] [facility];  Service: Cardiovascular;  Laterality: N/A;  . LEFT HEART CATHETERIZATION WITH CORONARY ANGIOGRAM N/A 12/15/2011   Procedure: LEFT HEART CATHETERIZATION WITH CORONARY ANGIOGRAM;  Surgeon: Burnell Blanks, MD;  Location: [facility] CATH LAB;  Service: Cardiovascular;  Laterality: N/A;  . LEFT HEART CATHETERIZATION WITH CORONARY ANGIOGRAM N/A 01/10/2012   Procedure: LEFT HEART CATHETERIZATION WITH CORONARY ANGIOGRAM;  Surgeon: Peter M Martinique, MD;  Location: [facility] CATH LAB;  Service: Cardiovascular;  Laterality: N/A;  . LEFT HEART CATHETERIZATION WITH CORONARY ANGIOGRAM N/A 06/08/2012   Procedure: LEFT HEART CATHETERIZATION WITH CORONARY ANGIOGRAM;  Surgeon: Burnell Blanks, MD;  Location: [facility] CATH LAB;  Service: Cardiovascular;  Laterality: N/A;  . LEFT HEART CATHETERIZATION WITH CORONARY/GRAFT ANGIOGRAM N/A 12/10/2013   Procedure: LEFT HEART CATHETERIZATION WITH Beatrix Fetters;  Surgeon: [name], MD;  Location: [facility] CATH LAB;  Service: Cardiovascular;  Laterality: N/A;  . OVARY SURGERY     ovarian cancer  . POLYPECTOMY  03/09/2019   Procedure: POLYPECTOMY;  Surgeon: Rogene Houston, MD;  [facility];  Service: Endoscopy;;  cecal   . POLYPECTOMY N/A 09/26/2019   Procedure: DUODENAL POLYPECTOMY;  Surgeon: Rogene Houston, MD;  [facility];  Service: Endoscopy;  Laterality: N/A;  . REVISION OF ARTERIOVENOUS GORETEX GRAFT N/A 02/24/2017   Procedure: REVISION OF ARTERIOVENOUS GORETEX GRAFT (RESECTION);  Surgeon: Katha Cabal, MD;  Location: [facility] ORS;  Service:  Vascular;  Laterality:  N/A;  Earney Mallet N/A 10/15/2013   Procedure: Fistulogram;  Surgeon: Serafina Mitchell, MD;  Location: [facility] CATH LAB;  Service: Cardiovascular;  Laterality: N/A;  . THROMBECTOMY / ARTERIOVENOUS GRAFT REVISION  2011   left upper arm  . TUBAL LIGATION  1980's  . UPPER EXTREMITY ANGIOGRAPHY Bilateral 12/06/2016   Procedure: Upper Extremity Angiography;  Surgeon: Katha Cabal, MD;  Location: [place] [facility];  Service: Cardiovascular;  Laterality: Bilateral;  . UPPER EXTREMITY INTERVENTION Left 06/06/2017   Procedure: UPPER EXTREMITY INTERVENTION;  Surgeon: Katha Cabal, MD;  Location: [place] [facility];  Service: Cardiovascular;  Laterality: Left;      Chief Complaint  Patient presents with  . Chest Pain      HPI:    Shawna [name]  is a 79 y.o. female  with HTN, chronic Anemia, ESRD on HD with Prior CABG and multiple stents presenting with CP to ED on 11/22/19  -Chest pains last LHC was 07/13/2018 with severe three-vessel occlusive CAD -Medical management was recommended at the time -Please see documented supplemental notes from earlier today  -Husband at bedside, -Patient is now chest pain-free after nitroglycerin  -Case discussed with patient primary cardiologist Dr. Mila Palmer reviewed patient's chart including EKG and labs and recommended discharge home without additional work-up at this time--- please see notes from Dr. Bronson Ing --- husband will drive patient directly to hemodialysis center in ED today he is a regular dialysis day 1030 am slot    No Nausea, Vomiting or Diarrhea No fever  Or chills  --No palpitations, no dizziness, no diaphoresis, no shortness of breath, no headaches, no leg swelling no pleuritic chest pain   Review of systems:    In addition to the HPI above,   A full Review of  Systems was done, all other systems reviewed are negative except as noted above in HPI , .    Social History:  Reviewed by me      Social History   Tobacco Use  . Smoking status: Never Smoker  . Smokeless tobacco: Never Used  Substance Use Topics  . Alcohol use: No    Alcohol/week: 0.0 standard drinks       Family History :  Reviewed by me    Family History  Problem Relation Age of Onset  . Heart disease Mother        Heart Disease before age 49  . Hyperlipidemia Mother   . Hypertension Mother   . Diabetes Mother   . Heart attack Mother   . Heart disease Father        Heart Disease before age 36  . Hyperlipidemia Father   . Hypertension Father   . Diabetes Father   . Diabetes Sister   . Hypertension Sister   . Diabetes Brother   . Hyperlipidemia Brother   . Heart attack Brother   . Hypertension Sister   . Heart attack Brother   . Colon cancer Child 72  . Other Other        noncontributory for early CAD  . Esophageal cancer Neg Hx   . Liver disease Neg Hx   . Kidney disease Neg Hx   . Colon polyps Neg Hx      Home Medications:   Prior to Admission medications   Medication Sig Start Date End Date Taking? Authorizing Provider  ALPRAZolam Duanne Moron) 0.5 MG tablet Take 0.5 mg by mouth at bedtime as needed for sleep.  10/16/18  Yes [provider]  amiodarone (PACERONE) 200 MG tablet Take 1 tablet (200 mg total) by mouth daily. 02/19/19  Yes Herminio Commons, MD  aspirin EC 81 MG tablet Take 1 tablet (81 mg total) by mouth daily. Restart on 03/13/19 09/27/19  Yes Rehman, Mechele Dawley, MD  diphenhydrAMINE (BENADRYL ALLERGY) 25 MG tablet Take 1 tablet (25 mg total) by mouth once for 1 dose. Take 1 tablet 1 hour prior to scheduled CT 02/09/20 02/09/20 Yes Dahlstedt, Annie Main, MD  fluticasone [facility]) 50 MCG/ACT nasal spray Place 1 spray at bedtime as needed into both nostrils for allergies.    Yes [provider]  gabapentin (NEURONTIN) 100 MG capsule Take 100 mg by mouth 2 (two) times daily.  06/10/19  Yes [provider]  isosorbide mononitrate (IMDUR) 60 MG 24 hr tablet Take 1  tablet (60 mg total) by mouth 2 (two) times daily. 10/22/19 01/20/20 Yes Herminio Commons, MD  lidocaine-prilocaine (EMLA) cream Apply 1 application topically every Monday, Wednesday, and Friday. Prior to dialysis   Yes [provider]  loratadine (CLARITIN) 10 MG tablet Take 1 tablet (10 mg total) by mouth daily as needed for allergies. 07/14/18  Yes Hosie Poisson, MD  multivitamin (RENA-VIT) TABS tablet Take 1 tablet by mouth daily.   Yes [provider]  nitroGLYCERIN (NITROSTAT) 0.4 MG SL tablet DISSOLVE ONE TABLET UNDER THE TONGUE EVERY 5 MINUTES AS NEEDED FOR CHEST PAIN.  DO NOT EXCEED A TOTAL OF 3 DOSES IN 15 MINUTES Patient taking differently: Place 0.4 mg under the tongue every 5 (five) minutes as needed for chest pain.  09/04/19  Yes Herminio Commons, MD  omeprazole (PRILOSEC) 40 MG capsule Take 1 capsule (40 mg total) by mouth daily before breakfast. 09/10/19  Yes Rehman, Mechele Dawley, MD  ondansetron (ZOFRAN-ODT) 4 MG disintegrating tablet Take 4 mg by mouth every 8 (eight) hours as needed for nausea or vomiting.  03/05/19  Yes [provider]  predniSONE (DELTASONE) 50 MG tablet To be used for CT contrast allergy . Take 1 tablet by mouth 13 hours before scheduled CT Take 1 tablet by mouth 7 hours before scheduled CT Take 1 tablet by mouth 1 hour before scheduled CT 02/09/20  Yes Dahlstedt, Annie Main, MD  sevelamer carbonate (RENVELA) 800 MG tablet Take 1,600-2,400 mg by mouth 3 (three) times daily with meals. Takes 3 tablets with meals and 2 tablets with snacks 04/03/19  Yes [provider]  simvastatin (ZOCOR) 20 MG tablet TAKE 1 TABLET BY MOUTH ONCE DAILY AT BEDTIME Patient taking differently: Take 20 mg by mouth at bedtime.  07/22/19  Yes Herminio Commons, MD  triamcinolone cream (KENALOG) 0.1 % Apply 1 application topically daily as needed (Dry skin/ itching).  09/11/19  Yes [provider]  doxycycline (DORYX) 100 MG EC tablet Take 100 mg by  mouth 2 (two) times daily.    [provider]     Allergies:     Allergies  Allergen Reactions  . Aspirin Other (See Comments)    High Doses Mess up her stomach; "makes my bowels have blood in them". Takes 81 mg EC Aspirin   . Penicillins Other (See Comments)    SYNCOPE? , "makes me real weak when I take it; like I'll pass out"  Has patient had a PCN reaction causing immediate rash, facial/tongue/throat swelling, SOB or lightheadedness with hypotension: Yes Has patient had a PCN reaction causing severe rash involving mucus membranes or skin necrosis: no Has patient had a PCN reaction  that required hospitalization no Has patient had a PCN reaction occurring within the last 10 years: no If all of the above  . Amlodipine Swelling  . Bactrim [Sulfamethoxazole-Trimethoprim] Rash  . Contrast Media [Iodinated Diagnostic Agents] Itching  . Iron Itching and Other (See Comments)    "they gave me iron in dialysis; had to give me Benadryl cause I had to have the iron" (05/02/2012)  . Nitrofurantoin Hives  . Tylenol [Acetaminophen] Itching and Other (See Comments)    Makes her feet on fire per pt  . Gabapentin Other (See Comments)    Unknown reaction  . Hydralazine Itching  . Dexilant [Dexlansoprazole] Other (See Comments)    Upset stomach  . Levaquin [Levofloxacin In D5w] Rash  . Morphine And Related Itching and Other (See Comments)    Itching in feet  . Plavix [Clopidogrel Bisulfate] Rash  . Protonix [Pantoprazole Sodium] Rash  . Venofer [Ferric Oxide] Itching and Other (See Comments)    Patient reports using Benadryl prior to doses as [facility]     Physical Exam:   Vitals  Blood pressure (!) 137/92, pulse 65, temperature 98.4 F (36.9 C), temperature source Oral, resp. rate 19, height 5\' 2"  (1.575 m), weight 59 kg, SpO2 96 %.  Physical Examination: General appearance - alert, well appearing, and in no distress  Mental status - alert, oriented to person, place, and  time,  Eyes - sclera anicteric Neck - supple, no JVD elevation , Chest - clear  to auscultation bilaterally, symmetrical air movement,  Heart - S1 and S2 normal, regular  Abdomen - soft, nontender, nondistended, no masses or organomegaly Neurological - screening mental status exam normal, neck supple without rigidity, cranial nerves II through XII intact, DTR's normal and symmetric Extremities - no pedal edema noted, intact peripheral pulses  MSK-left arm AV fistula with positive thrill and bruit Skin - warm, dry     Data Review:    CBC Recent Labs  Lab 11/22/19 0258  WBC 6.8  HGB 8.8*  HCT 29.5*  PLT 187  MCV 119.9*  MCH 35.8*  MCHC 29.8*  RDW 17.3*   ------------------------------------------------------------------------------------------------------------------  Chemistries  Recent Labs  Lab 11/22/19 0258  NA 141  K 5.5*  CL 100  CO2 28  GLUCOSE 100*  BUN 40*  CREATININE 8.08*  CALCIUM 8.9   ------------------------------------------------------------------------------------------------------------------ estimated creatinine clearance is 4.5 mL/min (A) (by C-G formula based on SCr of 8.08 mg/dL (H)). ------------------------------------------------------------------------------------------------------------------ No results for input(s): TSH, T4TOTAL, T3FREE, THYROIDAB in the last 72 hours.  Invalid input(s): FREET3   Coagulation profile No results for input(s): INR, PROTIME in the last 168 hours. ------------------------------------------------------------------------------------------------------------------- No results for input(s): DDIMER in the last 72 hours. -------------------------------------------------------------------------------------------------------------------  Cardiac Enzymes No results for input(s): CKMB, TROPONINI, MYOGLOBIN in the last 168 hours.  Invalid input(s):  CK ------------------------------------------------------------------------------------------------------------------    Component Value Date/Time   BNP 372.0 (H) 07/16/2016 1517     ---------------------------------------------------------------------------------------------------------------  Urinalysis    Component Value Date/Time   COLORURINE YELLOW 12/16/2012 1919   APPEARANCEUR CLOUDY (A) 12/16/2012 1919   LABSPEC 1.009 12/16/2012 1919   PHURINE 7.5 12/16/2012 1919   GLUCOSEU NEGATIVE 12/16/2012 1919   HGBUR TRACE (A) 12/16/2012 1919   BILIRUBINUR NEGATIVE 12/16/2012 1919   KETONESUR NEGATIVE 12/16/2012 1919   PROTEINUR 100 (A) 12/16/2012 1919   UROBILINOGEN 0.2 12/16/2012 1919   NITRITE NEGATIVE 12/16/2012 1919   LEUKOCYTESUR SMALL (A) 12/16/2012 1919    ----------------------------------------------------------------------------------------------------------------   Imaging Results:  DG Chest Port 1 View  Result Date: 11/22/2019 CLINICAL DATA:  Chest pain EXAM: PORTABLE CHEST 1 VIEW COMPARISON:  October 14, 2019 FINDINGS: There is mild cardiomegaly. Aortic knob calcifications. Overlying median sternotomy wires present. There is mild increased interstitial/streaky marking seen at both lung bases. No large airspace consolidation or pleural effusion. IMPRESSION: Mildly increased streaky airspace opacity seen at both lung bases which may be due to atelectasis and/or early infectious etiology. Electronically Signed   By: Prudencio Pair M.D.   On: 11/22/2019 02:46    Radiological Exams on Admission: DG Chest Port 1 View  Result Date: 11/22/2019 CLINICAL DATA:  Chest pain EXAM: PORTABLE CHEST 1 VIEW COMPARISON:  October 14, 2019 FINDINGS: There is mild cardiomegaly. Aortic knob calcifications. Overlying median sternotomy wires present. There is mild increased interstitial/streaky marking seen at both lung bases. No large airspace consolidation or pleural effusion. IMPRESSION: Mildly  increased streaky airspace opacity seen at both lung bases which may be due to atelectasis and/or early infectious etiology. Electronically Signed   By: Prudencio Pair M.D.   On: 11/22/2019 02:46    Family Communication: Admission, patients condition and plan of care including tests being ordered have been discussed with the patient and Husband at bedside  who indicate understanding and agree with the plan   Code Status - Full Code  Likely DC to  Home--- husband will drive patient directly to hemodialysis center in ED today he is a regular dialysis day 1030 am slot  Condition   stable  Roxan Hockey M.D on 11/22/2019 at 9:42 AM Go to [email or web address] -  for contact info  [facility] - Office  [phone]

## 2019-11-22 NOTE — ED Triage Notes (Signed)
Pt states she started having sharp chest pain approx 10 pm, has taken "2-3" nitro tabs with relief.  Pt denies pain at this time.   Dialysis pt M/W/F

## 2019-11-27 ENCOUNTER — Encounter: Payer: Self-pay | Admitting: Physician Assistant

## 2019-11-27 NOTE — Progress Notes (Signed)
Cardiology Office Note    Date:  11/28/2019   ID:  Shawna, [name] 1940/08/03, MRN [id number]  PCP:  [name], MD  Cardiologist:  Kate Sable, MD  Electrophysiologist:  None   Chief Complaint: f/u ER visit for chest pain  History of Present Illness:   Shawna [name] is a 79 y.o. female with history of CAD (s/p CABG in 2013, DES to D1 in 10/2016, cath in 07/2018 showing patent grafts with occlusion of D1 at prior stent site and progression of PDA disease --> medical management recommended), HTN, HLD, PAF (not on anticoagulation given history of GIB), chronic anemia, NSVT (on Amiodarone), ESRD, chronic diastolic CHF, colon CA, esophageal stricture, carotid artery disease (<40% by duplex 2016) who presents for post-ER follow-up. Per Dr. Court Joy note, she has a long history of noncompliance with antihypertensive therapy and makes adjustments on her own. She was hospitalized in April for acute hypoxic respiratory failure secondary to volume overload.  She received volume removal with hemodialysis with over 3 L removed and improved significantly. She took Imdur for 30 days then stopped afte discharged. It was later resumed at a follow-up visit. She was on carvedilol for a period of time but stopped for unclear reasons. Brilinta was previously discontinued during previous hospitalization due to GI bleeding and consequent anemia. She is also not on systemic anticoagulation for this reason. She was seen in the ED 11/22/19 with chest pain. Troponins were low/flat at 32-30. Dr. Bronson Ing did not feel she needed admission and recommended outpatient f/u. Last labs otherwise reviewed showed K 5.5, CR 8 (lytes followed in HD), Hgb 8.8, 05/2019 LFTs ok, no recent LFTs or lipids on file.  She is seen back for follow-up overall doing well. She has felt fine since discharge from the ED without recurrent events. She denies any unusual bleeding. No edema, orthopnea, syncope. She does report a history of  labile BP with episodic BP dropping - can happen on HD or non HD days. For that reason her blood pressure has been managed conservatively. She denies any dyspnea.   Past Medical History:  Diagnosis Date  . Acute on chronic respiratory failure with hypoxia ([place]) 10/10/2016  . Anxiety   . Arthritis   . AVM (arteriovenous malformation) of colon   . CAD (coronary artery disease)    a. s/p CABG in 2013 b. DES to D1 in 10/2016. c. cath in 07/2018 showing patent grafts with occlusion of D1 at prior stent site and progression of PDA disease --> medical management recommended  . Carotid artery disease ([place])    a. 63-84% LICA, 12/6597   . Chronic anemia   . Chronic bronchitis ([place])   . Chronic diastolic CHF (congestive heart failure) ([place])    a. 02/2012 Echo EF 60-65%, nl wall motion, Gr 1 DD, mod MR  . Colon cancer ([place]) 1992  . Esophageal stricture   . ESRD on hemodialysis ([place])    ESRD due to HTN, started dialysis 2011 and gets HD at [facility] with Dr Hinda Lenis on MWF schedule.  Access is LUA AVF as of Sept 2014.   Marland Kitchen GERD (gastroesophageal reflux disease)   . High cholesterol 12/2011  . History of blood transfusion 07/2011; 12/2011; 01/2012 X 2; 04/2012  . History of gout   . History of lower GI bleeding   . Hypertension   . Iron deficiency anemia   . Mitral regurgitation    a. Moderate by echo, 02/2012  . Myocardial infarction ([place])   .  NSVT (nonsustained ventricular tachycardia) ([place])   . Ovarian cancer ([place]) 1992  . PAF (paroxysmal atrial fibrillation) ([place])   . Pneumonia ~ 2009  . PUD (peptic ulcer disease)   . TIA (transient ischemic attack)     Past Surgical History:  Procedure Laterality Date  . A/V SHUNTOGRAM Left 03/19/2019   Procedure: A/V SHUNTOGRAM;  Surgeon: Katha Cabal, MD;  Location: [place] CV LAB;  Service: Cardiovascular;  Laterality: Left;  . ABDOMINAL HYSTERECTOMY  1992  . APPENDECTOMY  06/1990  . AV FISTULA PLACEMENT  07/2009   left upper arm  . AV  FISTULA PLACEMENT Right 09/06/2016   Procedure: RIGHT FOREARM ARTERIOVENOUS (AV) GRAFT;  Surgeon: Elam Dutch, MD;  [facility];  Service: Vascular;  Laterality: Right;  . AV FISTULA PLACEMENT N/A 02/24/2017   Procedure: INSERTION OF ARTERIOVENOUS (AV) GORE-TEX GRAFT ARM (BRACHIAL AXILLARY);  Surgeon: Katha Cabal, MD;  Location: [facility] ORS;  Service: Vascular;  Laterality: N/A;  . [place] Right 09/06/2016   Procedure: REMOVAL OF Right Arm ARTERIOVENOUS GORETEX GRAFT and Vein Patch angioplasty of brachial artery;  Surgeon: Angelia Mould, MD;  Location: [place];  Service: Vascular;  Laterality: Right;  . BIOPSY  09/26/2019   Procedure: BIOPSY;  Surgeon: Rogene Houston, MD;  [facility];  Service: Endoscopy;;  . COLON RESECTION  1992  . COLON SURGERY    . COLONOSCOPY N/A 03/09/2019   Procedure: COLONOSCOPY;  Surgeon: Rogene Houston, MD;  [facility];  Service: Endoscopy;  Laterality: N/A;  . CORONARY ANGIOPLASTY WITH STENT PLACEMENT  12/15/11   "2"  . CORONARY ANGIOPLASTY WITH STENT PLACEMENT  y/2013   "1; makes total of 3" (05/02/2012)  . CORONARY ARTERY BYPASS GRAFT  06/13/2012   Procedure: CORONARY ARTERY BYPASS GRAFTING (CABG);  Surgeon: Grace Isaac, MD;  Location: [place];  Service: Open Heart Surgery;  Laterality: N/A;  cabg x four;  using left internal mammary artery, and left leg greater saphenous vein harvested endoscopically  . CORONARY STENT INTERVENTION N/A 10/13/2016   Procedure: Coronary Stent Intervention;  Surgeon: Troy Sine, MD;  Location: [place] [facility] LAB;  Service: Cardiovascular;  Laterality: N/A;  . DIALYSIS/PERMA CATHETER REMOVAL N/A 04/18/2017   Procedure: DIALYSIS/PERMA CATHETER REMOVAL;  Surgeon: Katha Cabal, MD;  Location: [place] CV LAB;  Service: Cardiovascular;  Laterality: N/A;  . DILATION AND CURETTAGE OF UTERUS    . ESOPHAGOGASTRODUODENOSCOPY  01/20/2012   Procedure: ESOPHAGOGASTRODUODENOSCOPY (EGD);   Surgeon: Ladene Artist, MD,FACG;  Location: [facility] ENDOSCOPY;  Service: Endoscopy;  Laterality: N/A;  . ESOPHAGOGASTRODUODENOSCOPY N/A 03/26/2013   Procedure: ESOPHAGOGASTRODUODENOSCOPY (EGD);  Surgeon: Irene Shipper, MD;  Location: [facility] ENDOSCOPY;  Service: Endoscopy;  Laterality: N/A;  . ESOPHAGOGASTRODUODENOSCOPY N/A 04/30/2015   Procedure: ESOPHAGOGASTRODUODENOSCOPY (EGD);  Surgeon: Rogene Houston, MD;  [facility];  Service: Endoscopy;  Laterality: N/A;  1pm - moved to 10/20 @ 1:10  . ESOPHAGOGASTRODUODENOSCOPY N/A 07/29/2016   Procedure: ESOPHAGOGASTRODUODENOSCOPY (EGD);  Surgeon: Manus Gunning, MD;  Location: [place];  Service: Gastroenterology;  Laterality: N/A;  enteroscopy  . ESOPHAGOGASTRODUODENOSCOPY N/A 09/26/2019   Procedure: ESOPHAGOGASTRODUODENOSCOPY (EGD);  Surgeon: Rogene Houston, MD;  [facility];  Service: Endoscopy;  Laterality: N/A;  1250  . GIVENS CAPSULE STUDY N/A 03/07/2019   Procedure: GIVENS CAPSULE STUDY;  Surgeon: Rogene Houston, MD;  [facility];  Service: Endoscopy;  Laterality: N/A;  7:30  . INTRAOPERATIVE  TRANSESOPHAGEAL ECHOCARDIOGRAM  06/13/2012   Procedure: INTRAOPERATIVE TRANSESOPHAGEAL ECHOCARDIOGRAM;  Surgeon: Grace Isaac, MD;  Location: [place];  Service: Open Heart Surgery;  Laterality: N/A;  . IR GENERIC HISTORICAL  07/26/2016   IR FLUORO GUIDE CV LINE RIGHT 07/26/2016 Greggory Keen, MD [facility]  . IR GENERIC HISTORICAL  07/26/2016   IR US GUIDE VASC ACCESS RIGHT 07/26/2016 Greggory Keen, MD [facility]  . IR GENERIC HISTORICAL  08/02/2016   IR US GUIDE VASC ACCESS RIGHT 08/02/2016 Greggory Keen, MD [facility]  . IR GENERIC HISTORICAL  08/02/2016   IR FLUORO GUIDE CV LINE RIGHT 08/02/2016 Greggory Keen, MD [facility]  . IR RADIOLOGY PERIPHERAL GUIDED IV START  03/28/2017  . IR US GUIDE VASC ACCESS RIGHT  03/28/2017  . LEFT HEART CATH AND CORONARY ANGIOGRAPHY N/A 09/20/2016   Procedure: Left Heart  Cath and Coronary Angiography;  Surgeon: [name], MD;  Location: [place] CV LAB;  Service: Cardiovascular;  Laterality: N/A;  . LEFT HEART CATH AND CORS/GRAFTS ANGIOGRAPHY N/A 10/13/2016   Procedure: Left Heart Cath and Cors/Grafts Angiography;  Surgeon: Troy Sine, MD;  Location: [place] [facility] LAB;  Service: Cardiovascular;  Laterality: N/A;  . LEFT HEART CATH AND CORS/GRAFTS ANGIOGRAPHY N/A 07/13/2018   Procedure: LEFT HEART CATH AND CORS/GRAFTS ANGIOGRAPHY;  Surgeon: Martinique, Peter M, MD;  Location: [place] [facility];  Service: Cardiovascular;  Laterality: N/A;  . LEFT HEART CATHETERIZATION WITH CORONARY ANGIOGRAM N/A 12/15/2011   Procedure: LEFT HEART CATHETERIZATION WITH CORONARY ANGIOGRAM;  Surgeon: Burnell Blanks, MD;  Location: [facility] CATH LAB;  Service: Cardiovascular;  Laterality: N/A;  . LEFT HEART CATHETERIZATION WITH CORONARY ANGIOGRAM N/A 01/10/2012   Procedure: LEFT HEART CATHETERIZATION WITH CORONARY ANGIOGRAM;  Surgeon: Peter M Martinique, MD;  Location: [facility] CATH LAB;  Service: Cardiovascular;  Laterality: N/A;  . LEFT HEART CATHETERIZATION WITH CORONARY ANGIOGRAM N/A 06/08/2012   Procedure: LEFT HEART CATHETERIZATION WITH CORONARY ANGIOGRAM;  Surgeon: Burnell Blanks, MD;  Location: [facility] CATH LAB;  Service: Cardiovascular;  Laterality: N/A;  . LEFT HEART CATHETERIZATION WITH CORONARY/GRAFT ANGIOGRAM N/A 12/10/2013   Procedure: LEFT HEART CATHETERIZATION WITH Beatrix Fetters;  Surgeon: [name], MD;  Location: [facility] CATH LAB;  Service: Cardiovascular;  Laterality: N/A;  . OVARY SURGERY     ovarian cancer  . POLYPECTOMY  03/09/2019   Procedure: POLYPECTOMY;  Surgeon: Rogene Houston, MD;  [facility];  Service: Endoscopy;;  cecal   . POLYPECTOMY N/A 09/26/2019   Procedure: DUODENAL POLYPECTOMY;  Surgeon: Rogene Houston, MD;  [facility];  Service: Endoscopy;  Laterality: N/A;  . REVISION OF ARTERIOVENOUS GORETEX GRAFT N/A  02/24/2017   Procedure: REVISION OF ARTERIOVENOUS GORETEX GRAFT (RESECTION);  Surgeon: Katha Cabal, MD;  Location: [facility] ORS;  Service: Vascular;  Laterality: N/A;  E[place]ney [place] N/A 10/15/2013   Procedure: Fistulogram;  Surgeon: Serafina Mitchell, MD;  Location: [facility] CATH LAB;  Service: Cardiovascular;  Laterality: N/A;  . THROMBECTOMY / ARTERIOVENOUS GRAFT REVISION  2011   left upper arm  . TUBAL LIGATION  1980's  . UPPER EXTREMITY ANGIOGRAPHY Bilateral 12/06/2016   Procedure: Upper Extremity Angiography;  Surgeon: Katha Cabal, MD;  Location: [place] [facility] LAB;  Service: Cardiovascular;  Laterality: Bilateral;  . UPPER EXTREMITY INTERVENTION Left 06/06/2017   Procedure: UPPER EXTREMITY INTERVENTION;  Surgeon: Katha Cabal, MD;  Location: [place] [facility];  Service: Cardiovascular;  Laterality: Left;    Current Medications: Current  Meds  Medication Sig  . ALPRAZolam (XANAX) 0.5 MG tablet Take 0.5 mg by mouth at bedtime as needed for sleep.   Marland Kitchen amiodarone (PACERONE) 200 MG tablet Take 1 tablet (200 mg total) by mouth daily.  Marland Kitchen aspirin EC 81 MG tablet Take 1 tablet (81 mg total) by mouth daily. Restart on 03/13/19  . [START ON 02/09/2020] diphenhydrAMINE (BENADRYL ALLERGY) 25 MG tablet Take 1 tablet (25 mg total) by mouth once for 1 dose. Take 1 tablet 1 hour prior to scheduled CT  . doxycycline (DORYX) 100 MG EC tablet Take 100 mg by mouth 2 (two) times daily.  . fluticasone (FLONASE) 50 MCG/ACT nasal spray Place 1 spray at bedtime as needed into both nostrils for allergies.   Marland Kitchen gabapentin (NEURONTIN) 100 MG capsule Take 100 mg by mouth 2 (two) times daily.   . isosorbide mononitrate (IMDUR) 60 MG 24 hr tablet Take 1 tablet (60 mg total) by mouth 2 (two) times daily.  Marland Kitchen lidocaine-prilocaine (EMLA) cream Apply 1 application topically every Monday, Wednesday, and Friday. Prior to dialysis  . loratadine (CLARITIN) 10 MG tablet Take 1 tablet (10 mg total) by mouth daily as needed  for allergies.  . multivitamin (RENA-VIT) TABS tablet Take 1 tablet by mouth daily.  . nitroGLYCERIN (NITROSTAT) 0.4 MG SL tablet DISSOLVE ONE TABLET UNDER THE TONGUE EVERY 5 MINUTES AS NEEDED FOR CHEST PAIN.  DO NOT EXCEED A TOTAL OF 3 DOSES IN 15 MINUTES  . omeprazole (PRILOSEC) 40 MG capsule Take 1 capsule (40 mg total) by mouth daily before breakfast.  . ondansetron (ZOFRAN-ODT) 4 MG disintegrating tablet Take 4 mg by mouth every 8 (eight) hours as needed for nausea or vomiting.   Derrill Memo ON 02/09/2020] predniSONE (DELTASONE) 50 MG tablet To be used for CT contrast allergy . Take 1 tablet by mouth 13 hours before scheduled CT Take 1 tablet by mouth 7 hours before scheduled CT Take 1 tablet by mouth 1 hour before scheduled CT  . sevelamer carbonate (RENVELA) 800 MG tablet Take 1,600-2,400 mg by mouth 3 (three) times daily with meals. Takes 3 tablets with meals and 2 tablets with snacks  . simvastatin (ZOCOR) 20 MG tablet TAKE 1 TABLET BY MOUTH ONCE DAILY AT BEDTIME (Patient taking differently: Take 20 mg by mouth at bedtime. )  . triamcinolone cream (KENALOG) 0.1 % Apply 1 application topically daily as needed (Dry skin/ itching).       Allergies:   Aspirin, Penicillins, Amlodipine, Bactrim [sulfamethoxazole-trimethoprim], Contrast media [iodinated diagnostic agents], Iron, Nitrofurantoin, Tylenol [acetaminophen], Gabapentin, Hydralazine, Dexilant [dexlansoprazole], Levaquin [levofloxacin in d5w], Morphine and related, Plavix [clopidogrel bisulfate], Protonix [pantoprazole sodium], and Venofer [ferric oxide]   Social History   Socioeconomic History  . Marital status: Married    Spouse name: [name]  . Number of children: Not on file  . Years of education: Not on file  . Highest education level: Not on file  Occupational History  . Not on file  Tobacco Use  . Smoking status: Never Smoker  . Smokeless tobacco: Never Used  Substance and Sexual Activity  . Alcohol use: No     Alcohol/week: 0.0 standard drinks  . Drug use: No  . Sexual activity: Yes    Birth control/protection: Surgical  Other Topics Concern  . Not on file  Social History Narrative   Lives in [place], [place] with husband.  Dialysis pt - [facility].   Social Determinants of Health   Financial Resource Strain: Low Risk   .  Difficulty of Paying Living Expenses: Not very hard  Food Insecurity: No Food Insecurity  . Worried About Charity fundraiser in the Last Year: Never true  . Ran Out of Food in the Last Year: Never true  Transportation Needs: No Transportation Needs  . Lack of Transportation (Medical): No  . Lack of Transportation (Non-Medical): No  Physical Activity: Unknown  . Days of Exercise per Week: Patient refused  . Minutes of Exercise per Session: Patient refused  Stress: No Stress Concern Present  . Feeling of Stress : Only a little  Social Connections: Unknown  . Frequency of Communication with Friends and Family: Patient refused  . Frequency of Social Gatherings with Friends and Family: Patient refused  . Attends Religious Services: Patient refused  . Active Member of Clubs or Organizations: Patient refused  . Attends Archivist Meetings: Patient refused  . Marital Status: Patient refused     Family History:  The patient's family history includes Colon cancer (age of onset: 74) in her child; Diabetes in her brother, father, mother, and sister; Heart attack in her brother, brother, and mother; Heart disease in her father and mother; Hyperlipidemia in her brother, father, and mother; Hypertension in her father, mother, sister, and sister; Other in an other family member. There is no history of Esophageal cancer, Liver disease, Kidney disease, or Colon polyps.  ROS:   Please see the history of present illness.  All other systems are reviewed and otherwise negative.    EKGs/Labs/Other Studies Reviewed:    Studies reviewed are outlined and summarized above. Reports included  below if pertinent.  2D echo 12/2018 1. The left ventricle has normal systolic function with an ejection  fraction of 60-65%. The cavity size was normal. There is mild concentric  left ventricular hypertrophy. Left ventricular diastolic Doppler  parameters are consistent with impaired  relaxation. Elevated left ventricular end-diastolic pressure No evidence  of left ventricular regional wall motion abnormalities.  2. Left atrial size was mildly dilated.  3. The mitral valve is grossly normal. Mild thickening of the mitral  valve leaflet. There is mild mitral annular calcification present. Mitral  valve regurgitation is mild to moderate by color flow Doppler.  4. The tricuspid valve is grossly normal.  5. The aortic valve is tricuspid. Mild thickening of the aortic valve.  6. Moderate aortic annular calcification.  7. The aortic root is normal in size and structure.    LHC 07/2018 Conclusion    Ost RPDA to RPDA lesion is 75% stenosed.  Mid LM to Dist LM lesion is 60% stenosed.  Ost Ramus to Ramus lesion is 80% stenosed.  Ost Cx to Mid Cx lesion is 99% stenosed.  Prox LAD lesion is 90% stenosed.  Ost 1st Diag to 1st Diag lesion is 100% stenosed.  Ost RCA to Prox RCA lesion is 100% stenosed.  LIMA graft was visualized by angiography and is large.  The graft exhibits no disease.  SVG graft was visualized by angiography and is large.  The graft exhibits no disease.  SVG graft was visualized by angiography and is normal in caliber.  SVG graft was visualized by angiography and is large.  The graft exhibits no disease.  The left ventricular systolic function is normal.  LV end diastolic pressure is mildly elevated.  The left ventricular ejection fraction is 55-65% by visual estimate.   1. Severe 3 vessel occlusive CAD    - 60% distal left main    - 90% mid  LAD    - 100% first diagonal at prior stent site    - 99% proximal LCx at prior stent site    - 80%  ramus intermediate.    - 100% ostial RCA at prior stent site    - 75% proximal PDA 2. Patent LIMA to the LAD 3. Patent SVG to ramus intermediate 4. Patent SVG to OM 5. Patent SVG to the distal RCA. 6. Good LV function 7. Mildly elevated LVEDP  Plan: Compared to prior cath in April 2018 the diagonal is now occluded. There is progression of disease in the PDA. It is unclear if this is the reason for her chest pain. She is severely hypertensive and EDP is mildly elevated. I would recommend medical management with good BP control. If she has refractory angina despite good BP and volume management we could consider PCI of the PDA but this would have to be done through the SVG and in general she has not responded well to stenting in the past with all prior stents occluded. I will increase her Coreg dose. She will need dialysis in am.       EKG:  EKG is not ordered but reviewed from 11/22/19 NSR 70bpm, probable LAE, IRBBB, LVH with secondary repolarization abnormality  Recent Labs: 12/14/2018: Magnesium 2.0 05/17/2019: ALT 9 11/22/2019: BUN 40; Creatinine, Ser 8.08; Hemoglobin 8.8; Platelets 187; Potassium 5.5; Sodium 141  Recent Lipid Panel    Component Value Date/Time   CHOL 93 10/16/2017 0539   TRIG 56 10/16/2017 0539   HDL 56 10/16/2017 0539   CHOLHDL 1.7 10/16/2017 0539   VLDL 11 10/16/2017 0539   LDLCALC 26 10/16/2017 0539    PHYSICAL EXAM:    VS:  BP (!) 154/62   Pulse 75   Ht 5\' 2"  (1.575 m)   Wt 137 lb (62.1 kg)   SpO2 95%   BMI 25.06 kg/m   BMI: Body mass index is 25.06 kg/m.  GEN: Well nourished, well developed AAF, in no acute distress HEENT: normocephalic, atraumatic Neck: no JVD, carotid bruits, or masses Cardiac: RRR; no murmurs, rubs, or gallops, no edema  Respiratory:  clear to auscultation bilaterally, normal work of breathing GI: soft, nontender, nondistended, + BS MS: no deformity or atrophy Skin: warm and dry, no rash Neuro:  Alert and Oriented x 3,  Strength and sensation are intact, follows commands Psych: euthymic mood, full affect  Wt Readings from Last 3 Encounters:  11/28/19 137 lb (62.1 kg)  11/22/19 130 lb (59 kg)  10/22/19 130 lb (59 kg)     ASSESSMENT & PLAN:   1. CAD - patient has history of chronic chest pains. Dr. Bronson Ing reviewed recent hospital information and did not feel any further evaluation was necessary at that time. She has felt well since discharge without recurrence. Would continue current isosorbide. If she develops recurrent symptoms could consider intensifying anti-anginal regimen. Patient aware to notify for recurrence. Will also f/u CBC today to ensure stability of hemoglobin given slight downtrend in ED. We are also going to get LFTs and lipids today - She is on amiodarone and simvastatin. The maximum recommended dose of simvastatin while on amiodarone is 10mg  daily. She is on 20mg  daily. I anticipate changing her statin but would like to see what LDL is roughly trending to help guide that decision. 2. Chronic diastolic CHF - appears euvolemic. Volume managed by HD. See below regarding BP. 3. Paroxysmal atrial fibrillation - maintaining NSR. She is on amiodarone but not  specifically for PAF - has h/o NSVT as well. See below regarding amiodarone maintenance. 4. NSVT, on amiodarone therapy at the direction of Dr. Bronson Ing - no changes made to dose today. However, she is due for check of LFTs, thyroid and has not had PFTs recently. CXR showed mild interstitial changes but she has not had any dyspnea. Will follow up study results. Long term plan for amiodarone will be at the discretion of her primary cardiologist. Also encouraged her to make sure she gets a yearly eye exam by opthalmology. 5. Labile hypertension - recheck BP by me 142/60. She reports h/o episodic BP dropping including on non HD days. Lowest she's seen recently on non-HD days was 696 systolic. She had prior syncope related to this a while back as  well. As such, will manage conservatively for now. However, she is aware that if she notes she becomes persistently hypertensive without any further BP drops she may require additional hypertensive medication. She will keep [place] informed. Her blood pressure is also followed closely in the nephrology/HD setting. 6. Mild-moderate mitral regurgitation by echo 12/2018 - no recent dyspnea or breakthrough rhythm issues. No significant murmur on exam. Continue to follow clinically. Consider repeating echocardiogram later this year when seen in follow-up to trend.  Disposition: F/u with Dr. Bronson Ing 3 months either virtually or in person.  Medication Adjustments/Labs and Tests Ordered: Current medicines are reviewed at length with the patient today.  Concerns regarding medicines are outlined above. Medication changes, Labs and Tests ordered today are summarized above and listed in the Patient Instructions accessible in Encounters.    Signed, Shawna Pitter, PA-C  11/28/2019 1:51 PM    [facility] Location in [place]. [place], [place] [address] Ph: [phone]; Fax [phone]

## 2019-11-28 ENCOUNTER — Telehealth: Payer: Self-pay | Admitting: Cardiovascular Disease

## 2019-11-28 ENCOUNTER — Ambulatory Visit: Payer: Medicare HMO | Admitting: Physician Assistant

## 2019-11-28 ENCOUNTER — Encounter: Payer: Self-pay | Admitting: Physician Assistant

## 2019-11-28 ENCOUNTER — Other Ambulatory Visit: Payer: Self-pay

## 2019-11-28 VITALS — BP 154/62 | HR 75 | Ht 62.0 in | Wt 137.0 lb

## 2019-11-28 DIAGNOSIS — I472 Ventricular tachycardia: Secondary | ICD-10-CM

## 2019-11-28 DIAGNOSIS — I340 Nonrheumatic mitral (valve) insufficiency: Secondary | ICD-10-CM

## 2019-11-28 DIAGNOSIS — Z79899 Other long term (current) drug therapy: Secondary | ICD-10-CM

## 2019-11-28 DIAGNOSIS — R0989 Other specified symptoms and signs involving the circulatory and respiratory systems: Secondary | ICD-10-CM

## 2019-11-28 DIAGNOSIS — I5032 Chronic diastolic (congestive) heart failure: Secondary | ICD-10-CM | POA: Diagnosis not present

## 2019-11-28 DIAGNOSIS — I480 Paroxysmal atrial fibrillation: Secondary | ICD-10-CM | POA: Diagnosis not present

## 2019-11-28 DIAGNOSIS — I4729 Other ventricular tachycardia: Secondary | ICD-10-CM

## 2019-11-28 DIAGNOSIS — I2510 Atherosclerotic heart disease of native coronary artery without angina pectoris: Secondary | ICD-10-CM

## 2019-11-28 NOTE — Telephone Encounter (Signed)
  Patient Consent for Virtual Visit         Shawna [name] has provided verbal consent on 11/28/2019 for a virtual visit (video or telephone).   CONSENT FOR VIRTUAL VISIT FOR:  Shawna [name]  By participating in this virtual visit I agree to the following:  I hereby voluntarily request, consent and authorize [facility] and its employed or contracted physicians, physician assistants, nurse practitioners or other licensed health care professionals (the Practitioner), to provide me with telemedicine health care services (the "Services") as deemed necessary by the treating Practitioner. I acknowledge and consent to receive the Services by the Practitioner via telemedicine. I understand that the telemedicine visit will involve communicating with the Practitioner through live audiovisual communication technology and the disclosure of certain medical information by electronic transmission. I acknowledge that I have been given the opportunity to request an in-person assessment or other available alternative prior to the telemedicine visit and am voluntarily participating in the telemedicine visit.  I understand that I have the right to withhold or withdraw my consent to the use of telemedicine in the course of my care at any time, without affecting my right to future care or treatment, and that the Practitioner or I may terminate the telemedicine visit at any time. I understand that I have the right to inspect all information obtained and/or recorded in the course of the telemedicine visit and may receive copies of available information for a reasonable fee.  I understand that some of the potential risks of receiving the Services via telemedicine include:  Marland Kitchen Delay or interruption in medical evaluation due to technological equipment failure or disruption; . Information transmitted may not be sufficient (e.g. poor resolution of images) to allow for appropriate medical decision making by the Practitioner;  and/or  . In rare instances, security protocols could fail, causing a breach of personal health information.  Furthermore, I acknowledge that it is my responsibility to provide information about my medical history, conditions and care that is complete and accurate to the best of my ability. I acknowledge that Practitioner's advice, recommendations, and/or decision may be based on factors not within their control, such as incomplete or inaccurate data provided by me or distortions of diagnostic images or specimens that may result from electronic transmissions. I understand that the practice of medicine is not an exact science and that Practitioner makes no warranties or guarantees regarding treatment outcomes. I acknowledge that a copy of this consent can be made available to me via my patient portal ([place]), or I can request a printed copy by calling the office of [facility].    I understand that my insurance will be billed for this visit.   I have read or had this consent read to me. . I understand the contents of this consent, which adequately explains the benefits and risks of the Services being provided via telemedicine.  . I have been provided ample opportunity to ask questions regarding this consent and the Services and have had my questions answered to my satisfaction. . I give my informed consent for the services to be provided through the use of telemedicine in my medical care

## 2019-11-28 NOTE — Patient Instructions (Signed)
Medication Instructions:  Your physician recommends that you continue on your current medications as directed. Please refer to the Current Medication list given to you today.  *If you need a refill on your cardiac medications before your next appointment, please call your pharmacy*   Lab Work: Your physician recommends that you return for lab work in: Fasting   If you have labs (blood work) drawn today and your tests are completely normal, you will receive your results only by: Marland Kitchen MyChart Message (if you have MyChart) OR . A paper copy in the mail If you have any lab test that is abnormal or we need to change your treatment, we will call you to review the results.   Testing/Procedures: Your physician has recommended that you have a pulmonary function test. Pulmonary Function Tests are a group of tests that measure how well air moves in and out of your lungs.  Follow-Up: At [facility], you and your health needs are our priority.  As part of our continuing mission to provide you with exceptional heart care, we have created designated Provider Care Teams.  These Care Teams include your primary Cardiologist (physician) and Advanced Practice Providers (APPs -  Physician Assistants and Nurse Practitioners) who all work together to provide you with the care you need, when you need it.  We recommend signing up for the patient portal called "MyChart".  Sign up information is provided on this After Visit Summary.  MyChart is used to connect with patients for Virtual Visits (Telemedicine).  Patients are able to view lab/test results, encounter notes, upcoming appointments, etc.  Non-urgent messages can be sent to your provider as well.   To learn more about what you can do with MyChart, go to [email or web address].    Your next appointment:   3 month(s)  The format for your next appointment:   Either In Person or Virtual  Provider:   Kate Sable, MD   Other Instructions Thank you  for choosing [facility]!

## 2019-12-13 ENCOUNTER — Emergency Department (HOSPITAL_COMMUNITY)
Admission: EM | Admit: 2019-12-13 | Discharge: 2019-12-13 | Disposition: A | Discharge status: Home / Self Care | Payer: Medicare HMO | Attending: Emergency Medicine | Admitting: Emergency Medicine

## 2019-12-13 ENCOUNTER — Telehealth: Payer: Self-pay | Admitting: Internal Medicine

## 2019-12-13 ENCOUNTER — Encounter (HOSPITAL_COMMUNITY): Payer: Self-pay | Admitting: Nephrology

## 2019-12-13 DIAGNOSIS — I219 Acute myocardial infarction, unspecified: Secondary | ICD-10-CM | POA: Insufficient documentation

## 2019-12-13 DIAGNOSIS — N186 End stage renal disease: Secondary | ICD-10-CM | POA: Insufficient documentation

## 2019-12-13 DIAGNOSIS — R0789 Other chest pain: Secondary | ICD-10-CM | POA: Diagnosis present

## 2019-12-13 DIAGNOSIS — Z8543 Personal history of malignant neoplasm of ovary: Secondary | ICD-10-CM | POA: Diagnosis not present

## 2019-12-13 DIAGNOSIS — Z8673 Personal history of transient ischemic attack (TIA), and cerebral infarction without residual deficits: Secondary | ICD-10-CM | POA: Insufficient documentation

## 2019-12-13 DIAGNOSIS — Z7982 Long term (current) use of aspirin: Secondary | ICD-10-CM | POA: Insufficient documentation

## 2019-12-13 DIAGNOSIS — I470 Re-entry ventricular arrhythmia: Secondary | ICD-10-CM | POA: Diagnosis not present

## 2019-12-13 DIAGNOSIS — Z951 Presence of aortocoronary bypass graft: Secondary | ICD-10-CM | POA: Insufficient documentation

## 2019-12-13 DIAGNOSIS — I25118 Atherosclerotic heart disease of native coronary artery with other forms of angina pectoris: Secondary | ICD-10-CM | POA: Diagnosis not present

## 2019-12-13 DIAGNOSIS — Z992 Dependence on renal dialysis: Secondary | ICD-10-CM | POA: Insufficient documentation

## 2019-12-13 DIAGNOSIS — I5032 Chronic diastolic (congestive) heart failure: Secondary | ICD-10-CM | POA: Diagnosis not present

## 2019-12-13 DIAGNOSIS — Z91041 Radiographic dye allergy status: Secondary | ICD-10-CM | POA: Diagnosis not present

## 2019-12-13 DIAGNOSIS — Z880 Allergy status to penicillin: Secondary | ICD-10-CM | POA: Diagnosis not present

## 2019-12-13 DIAGNOSIS — Z888 Allergy status to other drugs, medicaments and biological substances status: Secondary | ICD-10-CM | POA: Insufficient documentation

## 2019-12-13 DIAGNOSIS — Z85038 Personal history of other malignant neoplasm of large intestine: Secondary | ICD-10-CM | POA: Diagnosis not present

## 2019-12-13 DIAGNOSIS — Z79899 Other long term (current) drug therapy: Secondary | ICD-10-CM | POA: Diagnosis not present

## 2019-12-13 DIAGNOSIS — R000 Tachycardia, unspecified: Secondary | ICD-10-CM

## 2019-12-13 DIAGNOSIS — I132 Hypertensive heart and chronic kidney disease with heart failure and with stage 5 chronic kidney disease, or end stage renal disease: Secondary | ICD-10-CM | POA: Insufficient documentation

## 2019-12-13 LAB — COMPREHENSIVE METABOLIC PANEL
ALT: 9 U/L (ref 0–44)
AST: 8 U/L — ABNORMAL LOW (ref 15–41)
Albumin: 3.2 g/dL — ABNORMAL LOW (ref 3.5–5.0)
Alkaline Phosphatase: 139 U/L — ABNORMAL HIGH (ref 38–126)
Anion gap: 11 (ref 5–15)
BUN: 16 mg/dL (ref 8–23)
CO2: 27 mmol/L (ref 22–32)
Calcium: 8 mg/dL — ABNORMAL LOW (ref 8.9–10.3)
Chloride: 98 mmol/L (ref 98–111)
Creatinine, Ser: 4.52 mg/dL — ABNORMAL HIGH (ref 0.44–1.00)
GFR calc Af Amer: 10 mL/min — ABNORMAL LOW (ref >60)
GFR calc non Af Amer: 9 mL/min — ABNORMAL LOW (ref >60)
Glucose, Bld: 99 mg/dL (ref 70–99)
Potassium: 3.2 mmol/L — ABNORMAL LOW (ref 3.5–5.1)
Sodium: 136 mmol/L (ref 135–145)
Total Bilirubin: 0.3 mg/dL (ref 0.3–1.2)
Total Protein: 6.5 g/dL (ref 6.5–8.1)

## 2019-12-13 LAB — CBC WITH DIFFERENTIAL/PLATELET
Abs Immature Granulocytes: 0.02 10*3/uL (ref 0.00–0.07)
Basophils Absolute: 0.1 10*3/uL (ref 0.0–0.1)
Basophils Relative: 1 %
Eosinophils Absolute: 0.4 10*3/uL (ref 0.0–0.5)
Eosinophils Relative: 5 %
HCT: 30.5 % — ABNORMAL LOW (ref 36.0–46.0)
Hemoglobin: 9.1 g/dL — ABNORMAL LOW (ref 12.0–15.0)
Immature Granulocytes: 0 %
Lymphocytes Relative: 9 %
Lymphs Abs: 0.8 10*3/uL (ref 0.7–4.0)
MCH: 34.6 pg — ABNORMAL HIGH (ref 26.0–34.0)
MCHC: 29.8 g/dL — ABNORMAL LOW (ref 30.0–36.0)
MCV: 116 fL — ABNORMAL HIGH (ref 80.0–100.0)
Monocytes Absolute: 0.7 10*3/uL (ref 0.1–1.0)
Monocytes Relative: 9 %
Neutro Abs: 6.7 10*3/uL (ref 1.7–7.7)
Neutrophils Relative %: 76 %
Platelets: 289 10*3/uL (ref 150–400)
RBC: 2.63 MIL/uL — ABNORMAL LOW (ref 3.87–5.11)
RDW: 17 % — ABNORMAL HIGH (ref 11.5–15.5)
WBC: 8.7 10*3/uL (ref 4.0–10.5)
nRBC: 0 % (ref 0.0–0.2)

## 2019-12-13 LAB — TROPONIN I (HIGH SENSITIVITY): Troponin I (High Sensitivity): 22 ng/L — ABNORMAL HIGH (ref <18)

## 2019-12-13 MED ORDER — POTASSIUM CHLORIDE CRYS ER 20 MEQ PO TBCR
40.0000 meq | EXTENDED_RELEASE_TABLET | Freq: Once | ORAL | Status: AC
  Administered 2019-12-13: 40 meq via ORAL
  Filled 2019-12-13: qty 2

## 2019-12-13 MED ORDER — METOPROLOL TARTRATE 5 MG/5ML IV SOLN
5.0000 mg | Freq: Once | INTRAVENOUS | Status: AC
  Administered 2019-12-13: 5 mg via INTRAVENOUS
  Filled 2019-12-13: qty 5

## 2019-12-13 MED ORDER — AMIODARONE HCL 200 MG PO TABS
200.0000 mg | ORAL_TABLET | Freq: Every day | ORAL | Status: DC
  Administered 2019-12-13: 200 mg via ORAL
  Filled 2019-12-13: qty 1

## 2019-12-13 NOTE — ED Triage Notes (Signed)
Pt complaining of chest pain that started during dialysis. Patient had 50 mins left. She has had 4 baby Aspirins and 1 nitro with relief. History of cardiac issues. Pt rates pain 1/10

## 2019-12-13 NOTE — ED Provider Notes (Signed)
[facility] EMERGENCY DEPARTMENT Provider Note   CSN: [id number] Arrival date & time: 12/13/19  1435     History Chief Complaint  Patient presents with  . Chest Pain    Shawna [name] is a 79 y.o. female.  Patient complains of palpitations.  Patient just came from dialysis.  Patient has history of coronary artery disease and history of tachycardia  The history is provided by the patient. No language interpreter was used.  Chest Pain Pain location:  Substernal area Pain quality: aching   Pain radiates to:  Does not radiate Pain severity:  Mild Onset quality:  Sudden Timing:  Intermittent Progression:  Resolved Chronicity:  Recurrent Context: not breathing   Associated symptoms: palpitations   Associated symptoms: no abdominal pain, no back pain, no cough, no fatigue and no headache        Past Medical History:  Diagnosis Date  . Acute on chronic respiratory failure with hypoxia ([place]) 10/10/2016  . Anxiety   . Arthritis   . AVM (arteriovenous malformation) of colon   . CAD (coronary artery disease)    a. s/p CABG in 2013 b. DES to D1 in 10/2016. c. cath in 07/2018 showing patent grafts with occlusion of D1 at prior stent site and progression of PDA disease --> medical management recommended  . Carotid artery disease ([place])    a. 54-27% LICA, 0/6237   . Chronic anemia   . Chronic bronchitis ([place])   . Chronic diastolic CHF (congestive heart failure) ([place])    a. 02/2012 Echo EF 60-65%, nl wall motion, Gr 1 DD, mod MR  . Colon cancer ([name]) 1992  . Esophageal stricture   . ESRD on hemodialysis ([place])    ESRD due to HTN, started dialysis 2011 and gets HD at [facility] with Dr Hinda Lenis on MWF schedule.  Access is LUA AVF as of Sept 2014.   Marland Kitchen GERD (gastroesophageal reflux disease)   . High cholesterol 12/2011  . History of blood transfusion 07/2011; 12/2011; 01/2012 X 2; 04/2012  . History of gout   . History of lower GI bleeding   . Hypertension   . Iron deficiency anemia     . Mitral regurgitation    a. Moderate by echo, 02/2012  . Myocardial infarction ([place])   . NSVT (nonsustained ventricular tachycardia) ([place])   . Ovarian cancer ([place]) 1992  . PAF (paroxysmal atrial fibrillation) ([place])   . Pneumonia ~ 2009  . PUD (peptic ulcer disease)   . TIA (transient ischemic attack)     Patient Active Problem List   Diagnosis Date Noted  . Pulmonary edema 10/14/2019  . Adenomatous duodenal polyp 09/10/2019  . History of GI bleed 09/10/2019  . Angina pectoris ([place]) 06/05/2019  . Chest pain 06/03/2019  . Rectal bleeding 05/14/2019  . Small intestinal bacterial overgrowth 05/14/2019  . Iron deficiency anemia 04/02/2019  . GI bleed 03/08/2019  . Gastrointestinal hemorrhage with melena 03/06/2019  . Acute respiratory failure with hypoxia ([place]) 12/25/2018  . Elevated troponin 12/14/2018  . Chest pain at rest 07/13/2018  . Hand steal syndrome ([place]) 08/01/2017  . Anemia 07/14/2017  . Coronary artery disease of bypass graft of native heart with stable angina pectoris ([place]) 06/05/2017  . Mesenteric ischemia ([place])   . Diverticulitis   . SBO (small bowel obstruction) ([place]) 03/22/2017  . Enteritis   . Complication of vascular access for dialysis 03/19/2017  . Preoperative clearance 01/25/2017  . Symptomatic anemia 10/24/2016  . H/O non-ST elevation myocardial  infarction (NSTEMI) 10/24/2016  . Fluid overload 10/10/2016  . Complication from renal dialysis device 10/10/2016  . Non-ST elevation MI (NSTEMI) ([place])   . Encounter for fitting and adjustment of vascular catheter   . Heme positive stool   . Demand ischemia ([place]) 07/27/2016  . Hypertensive emergency 07/08/2016  . Acute on chronic respiratory failure with hypoxia ([place])   . Cardiac arrest ([place])   . Palliative care encounter   . Goals of care, counseling/discussion   . Hypertensive crisis without congestive heart failure 05/09/2016  . Acute pulmonary edema ([place]) 04/06/2016  . Acute respiratory failure ([place])  04/06/2016  . Hypertensive crisis 01/27/2016  . History of colon cancer 01/27/2016  . History of ovarian cancer 01/27/2016  . Hypertensive urgency 01/27/2016  . AF (paroxysmal atrial fibrillation) ([place]) 10/14/2015  . Coronary angioplasty status 10/14/2015  . Malignant neoplasm of right ovary ([place]) 10/14/2015  . Narrow complex tachycardia ([place]) 09/08/2015  . SVT (supraventricular tachycardia) ([place]) 09/08/2015  . Influenza A 08/30/2015  . Acute on chronic diastolic CHF (congestive heart failure) ([place]) 05/04/2015  . Unstable angina ([place]) 05/03/2015  . Essential hypertension   . Pain in joint, lower leg 08/14/2014  . Dacryocystitis 05/29/2013  . Chronic diastolic CHF (congestive heart failure) ([place]) 03/22/2013  . GI bleeding 03/21/2013  . Acute blood loss anemia 03/21/2013  . Vaginal odor 03/12/2013  . Vaginal discharge 03/12/2013  . Occlusion and stenosis of carotid artery without mention of cerebral infarction 01/24/2013  . Hx of CABG 07/05/2012  . Carotid artery disease ([place]) 07/05/2012  . Mitral regurgitation 06/12/2012  . Pneumonia 06/09/2012  . Non-STEMI (non-ST elevated myocardial infarction) ([place]) 06/08/2012  . Ischemic chest pain ([place]) 03/01/2012  . 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 GERD (gastroesophageal reflux disease) 01/09/2012  . HLD (hyperlipidemia) 01/05/2012  . Atherosclerotic heart disease of native coronary artery without angina pectoris 12/16/2011  . ESRD on hemodialysis ([place]) 12/16/2011  . Anxiety disorder 05/04/2011  . Anemia in chronic kidney disease 04/29/2011  . Secondary hyperparathyroidism of renal origin ([place]) 04/29/2011  . End stage renal disease ([place]) 04/29/2011  . Gout 04/29/2011  . Hypertensive chronic kidney disease with stage 5 chronic kidney disease or end stage renal disease ([place]) 04/29/2011    Past Surgical History:  Procedure Laterality Date  . A/V SHUNTOGRAM Left 03/19/2019   Procedure: A/V SHUNTOGRAM;   Surgeon: Katha Cabal, MD;  Location: [place] [facility] LAB;  Service: Cardiovascular;  Laterality: Left;  . ABDOMINAL HYSTERECTOMY  1992  . APPENDECTOMY  06/1990  . AV FISTULA PLACEMENT  07/2009   left upper arm  . AV FISTULA PLACEMENT Right 09/06/2016   Procedure: RIGHT FOREARM ARTERIOVENOUS (AV) GRAFT;  Surgeon: Elam Dutch, MD;  Location: [facility] OR;  Service: Vascular;  Laterality: Right;  . AV FISTULA PLACEMENT N/A 02/24/2017   Procedure: INSERTION OF ARTERIOVENOUS (AV) GORE-TEX GRAFT ARM (BRACHIAL AXILLARY);  Surgeon: Katha Cabal, MD;  Location: [facility] ORS;  Service: Vascular;  Laterality: N/A;  . [place] Right 09/06/2016   Procedure: REMOVAL OF Right Arm ARTERIOVENOUS GORETEX GRAFT and Vein Patch angioplasty of brachial artery;  Surgeon: Angelia Mould, MD;  Location: [place];  Service: Vascular;  Laterality: Right;  . BIOPSY  09/26/2019   Procedure: BIOPSY;  Surgeon: Rogene Houston, MD;  [facility];  Service: Endoscopy;;  . COLON RESECTION  1992  . COLON SURGERY    . COLONOSCOPY N/A 03/09/2019   Procedure: COLONOSCOPY;  Surgeon:  Rogene Houston, MD;  [facility];  Service: Endoscopy;  Laterality: N/A;  . CORONARY ANGIOPLASTY WITH STENT PLACEMENT  12/15/11   "2"  . CORONARY ANGIOPLASTY WITH STENT PLACEMENT  y/2013   "1; makes total of 3" (05/02/2012)  . CORONARY ARTERY BYPASS GRAFT  06/13/2012   Procedure: CORONARY ARTERY BYPASS GRAFTING (CABG);  Surgeon: Grace Isaac, MD;  Location: [place];  Service: Open Heart Surgery;  Laterality: N/A;  cabg x four;  using left internal mammary artery, and left leg greater saphenous vein harvested endoscopically  . CORONARY STENT INTERVENTION N/A 10/13/2016   Procedure: Coronary Stent Intervention;  Surgeon: Troy Sine, MD;  Location: [place] [facility];  Service: Cardiovascular;  Laterality: N/A;  . DIALYSIS/PERMA CATHETER REMOVAL N/A 04/18/2017   Procedure: DIALYSIS/PERMA CATHETER REMOVAL;  Surgeon:  Katha Cabal, MD;  Location: [place] [facility] LAB;  Service: Cardiovascular;  Laterality: N/A;  . DILATION AND CURETTAGE OF UTERUS    . ESOPHAGOGASTRODUODENOSCOPY  01/20/2012   Procedure: ESOPHAGOGASTRODUODENOSCOPY (EGD);  Surgeon: Ladene Artist, MD,FACG;  Location: [facility] ENDOSCOPY;  Service: Endoscopy;  Laterality: N/A;  . ESOPHAGOGASTRODUODENOSCOPY N/A 03/26/2013   Procedure: ESOPHAGOGASTRODUODENOSCOPY (EGD);  Surgeon: Irene Shipper, MD;  Location: [facility] ENDOSCOPY;  Service: Endoscopy;  Laterality: N/A;  . ESOPHAGOGASTRODUODENOSCOPY N/A 04/30/2015   Procedure: ESOPHAGOGASTRODUODENOSCOPY (EGD);  Surgeon: Rogene Houston, MD;  [facility];  Service: Endoscopy;  Laterality: N/A;  1pm - moved to 10/20 @ 1:10  . ESOPHAGOGASTRODUODENOSCOPY N/A 07/29/2016   Procedure: ESOPHAGOGASTRODUODENOSCOPY (EGD);  Surgeon: Manus Gunning, MD;  Location: [place];  Service: Gastroenterology;  Laterality: N/A;  enteroscopy  . ESOPHAGOGASTRODUODENOSCOPY N/A 09/26/2019   Procedure: ESOPHAGOGASTRODUODENOSCOPY (EGD);  Surgeon: Rogene Houston, MD;  [facility];  Service: Endoscopy;  Laterality: N/A;  1250  . GIVENS CAPSULE STUDY N/A 03/07/2019   Procedure: GIVENS CAPSULE STUDY;  Surgeon: Rogene Houston, MD;  [facility];  Service: Endoscopy;  Laterality: N/A;  7:30  . INTRAOPERATIVE TRANSESOPHAGEAL ECHOCARDIOGRAM  06/13/2012   Procedure: INTRAOPERATIVE TRANSESOPHAGEAL ECHOCARDIOGRAM;  Surgeon: Grace Isaac, MD;  Location: [place];  Service: Open Heart Surgery;  Laterality: N/A;  . IR GENERIC HISTORICAL  07/26/2016   IR FLUORO GUIDE CV LINE RIGHT 07/26/2016 Greggory Keen, MD [facility]INTER[facility]  . IR GENERIC HISTORICAL  07/26/2016   IR US GUIDE VASC ACCESS RIGHT 07/26/2016 Greggory Keen, MD [facility]  . IR GENERIC HISTORICAL  08/02/2016   IR US GUIDE VASC ACCESS RIGHT 08/02/2016 Greggory Keen, MD [facility]  . IR GENERIC HISTORICAL  08/02/2016   IR FLUORO GUIDE CV LINE RIGHT  08/02/2016 Greggory Keen, MD [facility]  . IR RADIOLOGY PERIPHERAL GUIDED IV START  03/28/2017  . IR US GUIDE VASC ACCESS RIGHT  03/28/2017  . LEFT HEART CATH AND CORONARY ANGIOGRAPHY N/A 09/20/2016   Procedure: Left Heart Cath and Coronary Angiography;  Surgeon: [name], MD;  Location: [place] [facility] LAB;  Service: Cardiovascular;  Laterality: N/A;  . LEFT HEART CATH AND CORS/GRAFTS ANGIOGRAPHY N/A 10/13/2016   Procedure: Left Heart Cath and Cors/Grafts Angiography;  Surgeon: Troy Sine, MD;  Location: [place] [facility] LAB;  Service: Cardiovascular;  Laterality: N/A;  . LEFT HEART CATH AND CORS/GRAFTS ANGIOGRAPHY N/A 07/13/2018   Procedure: LEFT HEART CATH AND CORS/GRAFTS ANGIOGRAPHY;  Surgeon: Martinique, Peter M, MD;  Location: [place] [facility];  Service: Cardiovascular;  Laterality: N/A;  . LEFT HEART CATHETERIZATION WITH CORONARY ANGIOGRAM N/A  12/15/2011   Procedure: LEFT HEART CATHETERIZATION WITH CORONARY ANGIOGRAM;  Surgeon: Burnell Blanks, MD;  Location: [facility] CATH LAB;  Service: Cardiovascular;  Laterality: N/A;  . LEFT HEART CATHETERIZATION WITH CORONARY ANGIOGRAM N/A 01/10/2012   Procedure: LEFT HEART CATHETERIZATION WITH CORONARY ANGIOGRAM;  Surgeon: Peter M Martinique, MD;  Location: [facility] CATH LAB;  Service: Cardiovascular;  Laterality: N/A;  . LEFT HEART CATHETERIZATION WITH CORONARY ANGIOGRAM N/A 06/08/2012   Procedure: LEFT HEART CATHETERIZATION WITH CORONARY ANGIOGRAM;  Surgeon: Burnell Blanks, MD;  Location: [facility] CATH LAB;  Service: Cardiovascular;  Laterality: N/A;  . LEFT HEART CATHETERIZATION WITH CORONARY/GRAFT ANGIOGRAM N/A 12/10/2013   Procedure: LEFT HEART CATHETERIZATION WITH Beatrix Fetters;  Surgeon: [name], MD;  Location: [facility] CATH LAB;  Service: Cardiovascular;  Laterality: N/A;  . OVARY SURGERY     ovarian cancer  . POLYPECTOMY  03/09/2019   Procedure: POLYPECTOMY;  Surgeon: Rogene Houston, MD;  [facility];  Service: Endoscopy;;   cecal   . POLYPECTOMY N/A 09/26/2019   Procedure: DUODENAL POLYPECTOMY;  Surgeon: Rogene Houston, MD;  [facility];  Service: Endoscopy;  Laterality: N/A;  . REVISION OF ARTERIOVENOUS GORETEX GRAFT N/A 02/24/2017   Procedure: REVISION OF ARTERIOVENOUS GORETEX GRAFT (RESECTION);  Surgeon: Katha Cabal, MD;  Location: [facility] ORS;  Service: Vascular;  Laterality: N/A;  Earney Mallet N/A 10/15/2013   Procedure: Fistulogram;  Surgeon: Serafina Mitchell, MD;  Location: [facility] CATH LAB;  Service: Cardiovascular;  Laterality: N/A;  . THROMBECTOMY / ARTERIOVENOUS GRAFT REVISION  2011   left upper arm  . TUBAL LIGATION  1980's  . UPPER EXTREMITY ANGIOGRAPHY Bilateral 12/06/2016   Procedure: Upper Extremity Angiography;  Surgeon: Katha Cabal, MD;  Location: [place] [facility] LAB;  Service: Cardiovascular;  Laterality: Bilateral;  . UPPER EXTREMITY INTERVENTION Left 06/06/2017   Procedure: UPPER EXTREMITY INTERVENTION;  Surgeon: Katha Cabal, MD;  Location: [place] [facility] LAB;  Service: Cardiovascular;  Laterality: Left;     OB History    Gravida  2   Para  2   Term      Preterm  2   AB      Living  2     SAB      TAB      Ectopic      Multiple      Live Births              Family History  Problem Relation Age of Onset  . Heart disease Mother        Heart Disease before age 37  . Hyperlipidemia Mother   . Hypertension Mother   . Diabetes Mother   . Heart attack Mother   . Heart disease Father        Heart Disease before age 17  . Hyperlipidemia Father   . Hypertension Father   . Diabetes Father   . Diabetes Sister   . Hypertension Sister   . Diabetes Brother   . Hyperlipidemia Brother   . Heart attack Brother   . Hypertension Sister   . Heart attack Brother   . Colon cancer Child 65  . Other Other        noncontributory for early CAD  . Esophageal cancer Neg Hx   . Liver disease Neg Hx   . Kidney disease Neg Hx   . Colon polyps Neg Hx       Social History   Tobacco Use  .  Smoking status: Never Smoker  . Smokeless tobacco: Never Used  Substance Use Topics  . Alcohol use: No    Alcohol/week: 0.0 standard drinks  . Drug use: No    Home Medications Prior to Admission medications   Medication Sig Start Date End Date Taking? Authorizing Provider  ALPRAZolam Duanne Moron) 0.5 MG tablet Take 0.5 mg by mouth at bedtime as needed for sleep.  10/16/18   [provider]  amiodarone (PACERONE) 200 MG tablet Take 1 tablet (200 mg total) by mouth daily. 02/19/19   Herminio Commons, MD  aspirin EC 81 MG tablet Take 1 tablet (81 mg total) by mouth daily. Restart on 03/13/19 09/27/19   Rogene Houston, MD  diphenhydrAMINE (BENADRYL ALLERGY) 25 MG tablet Take 1 tablet (25 mg total) by mouth once for 1 dose. Take 1 tablet 1 hour prior to scheduled CT 02/09/20 02/09/20  Franchot Gallo, MD  doxycycline (DORYX) 100 MG EC tablet Take 100 mg by mouth 2 (two) times daily.    [provider]  fluticasone (FLONASE) 50 MCG/ACT nasal spray Place 1 spray at bedtime as needed into both nostrils for allergies.     [provider]  gabapentin (NEURONTIN) 100 MG capsule Take 100 mg by mouth 2 (two) times daily.  06/10/19   [provider]  isosorbide mononitrate (IMDUR) 60 MG 24 hr tablet Take 1 tablet (60 mg total) by mouth 2 (two) times daily. 10/22/19 01/20/20  Herminio Commons, MD  lidocaine-prilocaine (EMLA) cream Apply 1 application topically every Monday, Wednesday, and Friday. Prior to dialysis    [provider]  loratadine (CLARITIN) 10 MG tablet Take 1 tablet (10 mg total) by mouth daily as needed for allergies. 07/14/18   Hosie Poisson, MD  multivitamin (RENA-VIT) TABS tablet Take 1 tablet by mouth daily.    [provider]  nitroGLYCERIN (NITROSTAT) 0.4 MG SL tablet DISSOLVE ONE TABLET UNDER THE TONGUE EVERY 5 MINUTES AS NEEDED FOR CHEST PAIN.&nbsp;&nbsp;DO NOT EXCEED A TOTAL OF 3 DOSES IN 15  MINUTES 11/22/19   Emokpae, Courage, MD  omeprazole (PRILOSEC) 40 MG capsule Take 1 capsule (40 mg total) by mouth daily before breakfast. 09/10/19   Rehman, Mechele Dawley, MD  ondansetron (ZOFRAN-ODT) 4 MG disintegrating tablet Take 4 mg by mouth every 8 (eight) hours as needed for nausea or vomiting.  03/05/19   [provider]  predniSONE (DELTASONE) 50 MG tablet To be used for CT contrast allergy . Take 1 tablet by mouth 13 hours before scheduled CT Take 1 tablet by mouth 7 hours before scheduled CT Take 1 tablet by mouth 1 hour before scheduled CT 02/09/20   Franchot Gallo, MD  sevelamer carbonate (RENVELA) 800 MG tablet Take 1,600-2,400 mg by mouth 3 (three) times daily with meals. Takes 3 tablets with meals and 2 tablets with snacks 04/03/19   [provider]  simvastatin (ZOCOR) 20 MG tablet TAKE 1 TABLET BY MOUTH ONCE DAILY AT BEDTIME Patient taking differently: Take 20 mg by mouth at bedtime.  07/22/19   Herminio Commons, MD  triamcinolone cream (KENALOG) 0.1 % Apply 1 application topically daily as needed (Dry skin/ itching).  09/11/19   [provider]    Allergies    Aspirin, Penicillins, Amlodipine, Bactrim [sulfamethoxazole-trimethoprim], Contrast media [iodinated diagnostic agents], Iron, Nitrofurantoin, Tylenol [acetaminophen], Gabapentin, Hydralazine, Dexilant [dexlansoprazole], Levaquin [levofloxacin in d5w], Morphine and related, Plavix [clopidogrel bisulfate], Protonix [pantoprazole sodium], and Venofer [ferric oxide]  Review of Systems   Review of Systems  Constitutional: Negative for appetite change and fatigue.  HENT: Negative for congestion, ear discharge and sinus pressure.   Eyes: Negative for discharge.  Respiratory: Negative for cough.   Cardiovascular: Positive for chest pain and palpitations.  Gastrointestinal: Negative for abdominal pain and diarrhea.  Genitourinary: Negative for frequency and hematuria.  Musculoskeletal: Negative for back  pain.  Skin: Negative for rash.  Neurological: Negative for seizures and headaches.  Psychiatric/Behavioral: Negative for hallucinations.    Physical Exam Updated Vital Signs BP (!) 103/54   Pulse 64   Temp 98.2 F (36.8 C) (Oral)   Resp 18   Ht 5\' 1"  (1.549 m)   Wt 59 kg   SpO2 97%   BMI 24.56 kg/m   Physical Exam Vitals and nursing note reviewed.  Constitutional:      Appearance: She is well-developed.  HENT:     Head: Normocephalic.     Nose: Nose normal.  Eyes:     General: No scleral icterus.    Conjunctiva/sclera: Conjunctivae normal.  Neck:     Thyroid: No thyromegaly.  Cardiovascular:     Rate and Rhythm: Regular rhythm.     Heart sounds: No murmur. No friction rub. No gallop.      Comments: Tachycardia Pulmonary:     Breath sounds: No stridor. No wheezing or rales.  Chest:     Chest wall: No tenderness.  Abdominal:     General: There is no distension.     Tenderness: There is no abdominal tenderness. There is no rebound.  Musculoskeletal:        General: Normal range of motion.     Cervical back: Neck supple.  Lymphadenopathy:     Cervical: No cervical adenopathy.  Skin:    Findings: No erythema or rash.  Neurological:     Mental Status: She is alert and oriented to person, place, and time.     Motor: No abnormal muscle tone.     Coordination: Coordination normal.  Psychiatric:        Behavior: Behavior normal.     ED Results / Procedures / Treatments   Labs (all labs ordered are listed, but only abnormal results are displayed) Labs Reviewed  CBC WITH DIFFERENTIAL/PLATELET - Abnormal; Notable for the following components:      Result Value   RBC 2.63 (*)    Hemoglobin 9.1 (*)    HCT 30.5 (*)    MCV 116.0 (*)    MCH 34.6 (*)    MCHC 29.8 (*)    RDW 17.0 (*)    All other components within normal limits  COMPREHENSIVE METABOLIC PANEL - Abnormal; Notable for the following components:   Potassium 3.2 (*)    Creatinine, Ser 4.52 (*)     Calcium 8.0 (*)    Albumin 3.2 (*)    AST 8 (*)    Alkaline Phosphatase 139 (*)    GFR calc non Af Amer 9 (*)    GFR calc Af Amer 10 (*)    All other components within normal limits  TROPONIN I (HIGH SENSITIVITY) - Abnormal; Notable for the following components:   Troponin I (High Sensitivity) 22 (*)    All other components within normal limits    EKG EKG Interpretation  Date/Time:  Friday December 13 2019 14:54:51 EDT Ventricular Rate:  135 PR Interval:    QRS Duration: 112 QT Interval:  350 QTC Calculation: 525 R Axis:   14 Text Interpretation: Supraventricular tachycardia Incomplete left bundle branch block Left  ventricular hypertrophy with repolarization abnormality Non-specific ST-t changes Confirmed by Lajean Saver [phone]) on 12/13/2019 3:06:44 PM   Radiology No results found.  Procedures Procedures (including critical care time)  Medications Ordered in ED Medications  amiodarone (PACERONE) tablet 200 mg (200 mg Oral Given 12/13/19 1556)  potassium chloride SA (KLOR-CON) CR tablet 40 mEq (has no administration in time range)  metoprolol tartrate (LOPRESSOR) injection 5 mg (5 mg Intravenous Given 12/13/19 1622)    ED Course  I have reviewed the triage vital signs and the nursing notes.  Pertinent labs & imaging results that were available during my care of the patient were reviewed by me and considered in my medical decision making (see chart for details).    CRITICAL CARE Performed by: Milton Ferguson Total critical care time 40 minutes Critical care time was exclusive of separately billable procedures and treating other patients. Critical care was necessary to treat or prevent imminent or life-threatening deterioration. Critical care was time spent personally by me on the following activities: development of treatment plan with patient and/or surrogate as well as nursing, discussions with consultants, evaluation of patient's response to treatment, examination of patient,  obtaining history from patient or surrogate, ordering and performing treatments and interventions, ordering and review of laboratory studies, ordering and review of radiographic studies, pulse oximetry and re-evaluation of patient's condition.  MDM Rules/Calculators/A&P                      Patient with palpitations.  Patient's tachycardia resolved with an additional amiodarone and Lopressor.  Labs show mild hypokalemia.  I spoke with Dr. Harrington Challenger and she feels like the patient can be discharged home with follow-up with her cardiologist next week and increase her amiodarone so she is taking 1 tablet twice a day       This patient presents to the ED for concern of tachycardia this involves an extensive number of treatment options, and is a complaint that carries with it a high risk of complications and morbidity.  The differential diagnosis includes MI, reentry problem, cardiac   Lab Tests:   I Ordered, reviewed, and interpreted labs, which included CBC chemistries troponin which showed anemia for renal failure and mildly elevated troponin  Medicines ordered:   I ordered medication amiodarone and Lopressor for tachycardia  Imaging Studies ordered:     Additional history obtained:   Additional history obtained from spouse  Previous records obtained and reviewed   Consultations Obtained:   I consulted cardiology and discussed lab and imaging findings  Reevaluation:  After the interventions stated above, I reevaluated the patient and found much improved  Critical Interventions:  .   Final Clinical Impression(s) / ED Diagnoses Final diagnoses:  Tachycardia    Rx / DC Orders ED Discharge Orders    None       Milton Ferguson, MD 12/16/19 1133

## 2019-12-13 NOTE — Telephone Encounter (Signed)
Pt seen in ED at [facility] on 12/13/19   She was in Afib with RVR  Converted on own to SR    Not on anticoag due to GI bleed  I discussed with Dr Penny Pia OK for pt to go home  Recommended that she increase amiodarone to 200 bid  Pt needs f/u in clinic late next week, early next

## 2019-12-13 NOTE — Discharge Instructions (Addendum)
Increase your Pacerone so you are taking 200 mg twice a day and follow-up with Dr. Jacinta Shoe next week

## 2019-12-16 ENCOUNTER — Other Ambulatory Visit: Payer: Self-pay

## 2019-12-16 ENCOUNTER — Telehealth: Payer: Self-pay

## 2019-12-16 ENCOUNTER — Other Ambulatory Visit (HOSPITAL_COMMUNITY)
Admission: RE | Admit: 2019-12-16 | Discharge: 2019-12-16 | Disposition: A | Discharge status: Home / Self Care | Payer: Medicare HMO | Source: Ambulatory Visit | Attending: Physician Assistant | Admitting: Physician Assistant

## 2019-12-16 DIAGNOSIS — Z01812 Encounter for preprocedural laboratory examination: Secondary | ICD-10-CM | POA: Diagnosis present

## 2019-12-16 DIAGNOSIS — Z20822 Contact with and (suspected) exposure to covid-19: Secondary | ICD-10-CM | POA: Diagnosis not present

## 2019-12-16 MED ORDER — AMIODARONE HCL 200 MG PO TABS: 200.0000 mg | ORAL_TABLET | Freq: Two times a day (BID) | ORAL | 1 refills | Status: DC

## 2019-12-16 NOTE — Telephone Encounter (Signed)
Pt seen in ED at [facility] on 12/13/19   She was in Afib with RVR  Converted on own to SR    Not on anticoag due to GI bleed  I discussed with Dr Penny Pia OK for pt to go home  Recommended that she increase amiodarone to 200 bid  Pt needs f/u in clinic late next week, early next      Documentation    Reece Levy will schedule f/u apt

## 2019-12-17 ENCOUNTER — Ambulatory Visit: Payer: Medicare HMO | Admitting: Student

## 2019-12-17 ENCOUNTER — Ambulatory Visit (HOSPITAL_COMMUNITY)
Admission: RE | Admit: 2019-12-17 | Discharge: 2019-12-17 | Disposition: A | Discharge status: Home / Self Care | Payer: Medicare HMO | Source: Ambulatory Visit | Attending: Physician Assistant | Admitting: Physician Assistant

## 2019-12-17 ENCOUNTER — Encounter: Payer: Self-pay | Admitting: Student

## 2019-12-17 VITALS — BP 132/58 | HR 76 | Ht 61.0 in | Wt 136.2 lb

## 2019-12-17 DIAGNOSIS — I25708 Atherosclerosis of coronary artery bypass graft(s), unspecified, with other forms of angina pectoris: Secondary | ICD-10-CM

## 2019-12-17 DIAGNOSIS — Z79899 Other long term (current) drug therapy: Secondary | ICD-10-CM

## 2019-12-17 DIAGNOSIS — E782 Mixed hyperlipidemia: Secondary | ICD-10-CM

## 2019-12-17 DIAGNOSIS — I480 Paroxysmal atrial fibrillation: Secondary | ICD-10-CM

## 2019-12-17 DIAGNOSIS — I10 Essential (primary) hypertension: Secondary | ICD-10-CM | POA: Diagnosis not present

## 2019-12-17 DIAGNOSIS — N186 End stage renal disease: Secondary | ICD-10-CM

## 2019-12-17 LAB — PULMONARY FUNCTION TEST
DL/VA % pred: 78 %
DL/VA: 3.27 ml/min/mmHg/L
DLCO unc % pred: 28 %
DLCO unc: 4.82 ml/min/mmHg
FEF 25-75 Pre: 0.77 L/sec
FEF2575-%Pred-Pre: 67 %
FEV1-%Pred-Pre: 64 %
FEV1-Pre: 0.85 L
FEV1FVC-%Pred-Pre: 105 %
FEV6-%Pred-Pre: 64 %
FEV6-Pre: 1.06 L
FEV6FVC-%Pred-Pre: 104 %
FVC-%Pred-Pre: 61 %
FVC-Pre: 1.07 L
Pre FEV1/FVC ratio: 79 %
Pre FEV6/FVC Ratio: 99 %
RV % pred: 227 %
RV: 5.05 L
TLC % pred: 136 %
TLC: 6.29 L

## 2019-12-17 LAB — SARS CORONAVIRUS 2 (TAT 6-24 HRS): SARS Coronavirus 2: NEGATIVE

## 2019-12-17 MED ORDER — ALBUTEROL SULFATE (2.5 MG/3ML) 0.083% IN NEBU: 2.5000 mg | INHALATION_SOLUTION | Freq: Once | RESPIRATORY_TRACT | Status: DC

## 2019-12-17 MED ORDER — AMIODARONE HCL 200 MG PO TABS
200.0000 mg | ORAL_TABLET | Freq: Every day | ORAL | 3 refills | Status: DC
Start: 2019-12-17 — End: 2020-12-17

## 2019-12-17 NOTE — Progress Notes (Signed)
Cardiology Office Note    Date:  12/17/2019   ID:  Shawna [name], DOB Feb 12, 1941, MRN [id number]  PCP:  [name], MD  Cardiologist: Kate Sable, MD    Chief Complaint  Patient presents with  . Follow-up    recent Emergency Dept visit    History of Present Illness:    Shawna [name] is a 79 y.o. female with past medical history ofCAD (s/p CABG in 2013,DES to D1 in 10/2016 with cath showing patent LIMA-LAD, SVG-RI, SVG-Mrg and SVG-RCA, cath in 07/2018 showingpatentgrafts with occlusion of D1 at prior stent site and progression of PDA disease --> medical management recommended), chronic diastolic CHF, carotid artery disease, HTN, HLD, paroxysmal atrial fibrillation (not on anticoagulation given history of GIB),NSVT (on Amiodarone)and ESRD who presents to the office today for follow-up from a recent Emergency Department visit.   She was last examined by Melina Copa, PA-C on 11/28/2019 and denied any recent chest pain and felt her breathing has been stable. She reported a history of labile BP and would experience worsening symptoms if SBP < 120, therefore BP had been managed conservatively. She was overdue for repeat FLP and LFT's therefore both were ordered but were not obtained.   In the interim, she presented to [facility] on 12/13/2019 for evaluation of chest pain which started while at HD. She was found to be in atrial fibrillation with RVR, HR in the 130's. Hgb was stable at 9.1 with platelets at 289. K+ was low at 3.2. AST was 8 and ALT 9. HS Troponin was 22 which was consistent with prior values. She was given IV Lopressor along with Amiodarone 200mg  and converted back to NSR. It was recommended to increase her Amiodarone from 200mg  daily to 200mg  BID.   In talking with the patient today, she denies any recurrent palpitations since her recent ED visit.  She has baseline dyspnea on exertion but denies any acute changes in this. No recent orthopnea, PND or lower extremity edema.  She does have occasional episodes of chest pain if she "over does" it but symptoms always resolve with SL NTG x1.    Past Medical History:  Diagnosis Date  . Acute on chronic respiratory failure with hypoxia ([place]) 10/10/2016  . Anxiety   . Arthritis   . AVM (arteriovenous malformation) of colon   . CAD (coronary artery disease)    a. s/p CABG in 2013 b. DES to D1 in 10/2016. c. cath in 07/2018 showing patent grafts with occlusion of D1 at prior stent site and progression of PDA disease --> medical management recommended  . Carotid artery disease ([place])    a. 32-67% LICA, 07/2456   . Chronic anemia   . Chronic bronchitis ([place])   . Chronic diastolic CHF (congestive heart failure) ([place])    a. 02/2012 Echo EF 60-65%, nl wall motion, Gr 1 DD, mod MR  . Colon cancer ([place]) 1992  . Esophageal stricture   . ESRD on hemodialysis ([place])    ESRD due to HTN, started dialysis 2011 and gets HD at [facility] with Dr Hinda Lenis on MWF schedule.  Access is LUA AVF as of Sept 2014.   Marland Kitchen GERD (gastroesophageal reflux disease)   . High cholesterol 12/2011  . History of blood transfusion 07/2011; 12/2011; 01/2012 X 2; 04/2012  . History of gout   . History of lower GI bleeding   . Hypertension   . Iron deficiency anemia   . Mitral regurgitation    a.  Moderate by echo, 02/2012  . Myocardial infarction ([place])   . NSVT (nonsustained ventricular tachycardia) ([place])   . Ovarian cancer ([place]) 1992  . PAF (paroxysmal atrial fibrillation) ([place])   . Pneumonia ~ 2009  . PUD (peptic ulcer disease)   . TIA (transient ischemic attack)     Past Surgical History:  Procedure Laterality Date  . A/V SHUNTOGRAM Left 03/19/2019   Procedure: A/V SHUNTOGRAM;  Surgeon: Katha Cabal, MD;  Location: [place] [facility];  Service: Cardiovascular;  Laterality: Left;  . ABDOMINAL HYSTERECTOMY  1992  . APPENDECTOMY  06/1990  . AV FISTULA PLACEMENT  07/2009   left upper arm  . AV FISTULA PLACEMENT Right 09/06/2016   Procedure:  RIGHT FOREARM ARTERIOVENOUS (AV) GRAFT;  Surgeon: Elam Dutch, MD;  Location: [facility] OR;  Service: Vascular;  Laterality: Right;  . AV FISTULA PLACEMENT N/A 02/24/2017   Procedure: INSERTION OF ARTERIOVENOUS (AV) GORE-TEX GRAFT ARM (BRACHIAL AXILLARY);  Surgeon: Katha Cabal, MD;  Location: [facility] ORS;  Service: Vascular;  Laterality: N/A;  . [place] Right 09/06/2016   Procedure: REMOVAL OF Right Arm ARTERIOVENOUS GORETEX GRAFT and Vein Patch angioplasty of brachial artery;  Surgeon: Angelia Mould, MD;  Location: [place];  Service: Vascular;  Laterality: Right;  . BIOPSY  09/26/2019   Procedure: BIOPSY;  Surgeon: Rogene Houston, MD;  [facility];  Service: Endoscopy;;  . COLON RESECTION  1992  . COLON SURGERY    . COLONOSCOPY N/A 03/09/2019   Procedure: COLONOSCOPY;  Surgeon: Rogene Houston, MD;  [facility];  Service: Endoscopy;  Laterality: N/A;  . CORONARY ANGIOPLASTY WITH STENT PLACEMENT  12/15/11   "2"  . CORONARY ANGIOPLASTY WITH STENT PLACEMENT  y/2013   "1; makes total of 3" (05/02/2012)  . CORONARY ARTERY BYPASS GRAFT  06/13/2012   Procedure: CORONARY ARTERY BYPASS GRAFTING (CABG);  Surgeon: Grace Isaac, MD;  Location: [place];  Service: Open Heart Surgery;  Laterality: N/A;  cabg x four;  using left internal mammary artery, and left leg greater saphenous vein harvested endoscopically  . CORONARY STENT INTERVENTION N/A 10/13/2016   Procedure: Coronary Stent Intervention;  Surgeon: Troy Sine, MD;  Location: [place] [facility];  Service: Cardiovascular;  Laterality: N/A;  . DIALYSIS/PERMA CATHETER REMOVAL N/A 04/18/2017   Procedure: DIALYSIS/PERMA CATHETER REMOVAL;  Surgeon: Katha Cabal, MD;  Location: [place] [facility];  Service: Cardiovascular;  Laterality: N/A;  . DILATION AND CURETTAGE OF UTERUS    . ESOPHAGOGASTRODUODENOSCOPY  01/20/2012   Procedure: ESOPHAGOGASTRODUODENOSCOPY (EGD);  Surgeon: Ladene Artist, MD,FACG;  Location:  [facility] ENDOSCOPY;  Service: Endoscopy;  Laterality: N/A;  . ESOPHAGOGASTRODUODENOSCOPY N/A 03/26/2013   Procedure: ESOPHAGOGASTRODUODENOSCOPY (EGD);  Surgeon: Irene Shipper, MD;  Location: [facility] ENDOSCOPY;  Service: Endoscopy;  Laterality: N/A;  . ESOPHAGOGASTRODUODENOSCOPY N/A 04/30/2015   Procedure: ESOPHAGOGASTRODUODENOSCOPY (EGD);  Surgeon: Rogene Houston, MD;  [facility];  Service: Endoscopy;  Laterality: N/A;  1pm - moved to 10/20 @ 1:10  . ESOPHAGOGASTRODUODENOSCOPY N/A 07/29/2016   Procedure: ESOPHAGOGASTRODUODENOSCOPY (EGD);  Surgeon: Manus Gunning, MD;  Location: [place];  Service: Gastroenterology;  Laterality: N/A;  enteroscopy  . ESOPHAGOGASTRODUODENOSCOPY N/A 09/26/2019   Procedure: ESOPHAGOGASTRODUODENOSCOPY (EGD);  Surgeon: Rogene Houston, MD;  [facility];  Service: Endoscopy;  Laterality: N/A;  1250  . GIVENS CAPSULE STUDY N/A 03/07/2019   Procedure: GIVENS CAPSULE STUDY;  Surgeon: Rogene Houston, MD;  [facility];  Service: Endoscopy;  Laterality: N/A;  7:30  . INTRAOPERATIVE TRANSESOPHAGEAL ECHOCARDIOGRAM  06/13/2012   Procedure: INTRAOPERATIVE TRANSESOPHAGEAL ECHOCARDIOGRAM;  Surgeon: [place] Isaac, MD;  Location: [place];  Service: Open Heart Surgery;  Laterality: N/A;  . IR GENERIC HISTORICAL  07/26/2016   IR FLUORO GUIDE CV LINE RIGHT 07/26/2016 Greggory Keen, MD [facility]INTER[facility]  . IR GENERIC HISTORICAL  07/26/2016   IR US GUIDE VASC ACCESS RIGHT 07/26/2016 Greggory Keen, MD [facility]  . IR GENERIC HISTORICAL  08/02/2016   IR US GUIDE VASC ACCESS RIGHT 08/02/2016 Greggory Keen, MD [facility]  . IR GENERIC HISTORICAL  08/02/2016   IR FLUORO GUIDE CV LINE RIGHT 08/02/2016 Greggory Keen, MD [facility]  . IR RADIOLOGY PERIPHERAL GUIDED IV START  03/28/2017  . IR US GUIDE VASC ACCESS RIGHT  03/28/2017  . LEFT HEART CATH AND CORONARY ANGIOGRAPHY N/A 09/20/2016   Procedure: Left Heart Cath and Coronary Angiography;  Surgeon: [name], MD;  Location: [place] [facility];  Service: Cardiovascular;  Laterality: N/A;  . LEFT HEART CATH AND CORS/GRAFTS ANGIOGRAPHY N/A 10/13/2016   Procedure: Left Heart Cath and Cors/Grafts Angiography;  Surgeon: Troy Sine, MD;  Location: [place] [facility];  Service: Cardiovascular;  Laterality: N/A;  . LEFT HEART CATH AND CORS/GRAFTS ANGIOGRAPHY N/A 07/13/2018   Procedure: LEFT HEART CATH AND CORS/GRAFTS ANGIOGRAPHY;  Surgeon: Martinique, Peter M, MD;  Location: [place] [facility];  Service: Cardiovascular;  Laterality: N/A;  . LEFT HEART CATHETERIZATION WITH CORONARY ANGIOGRAM N/A 12/15/2011   Procedure: LEFT HEART CATHETERIZATION WITH CORONARY ANGIOGRAM;  Surgeon: [place] Blanks, MD;  Location: [facility] CATH LAB;  Service: Cardiovascular;  Laterality: N/A;  . LEFT HEART CATHETERIZATION WITH CORONARY ANGIOGRAM N/A 01/10/2012   Procedure: LEFT HEART CATHETERIZATION WITH CORONARY ANGIOGRAM;  Surgeon: Peter M Martinique, MD;  Location: [facility] CATH LAB;  Service: Cardiovascular;  Laterality: N/A;  . LEFT HEART CATHETERIZATION WITH CORONARY ANGIOGRAM N/A 06/08/2012   Procedure: LEFT HEART CATHETERIZATION WITH CORONARY ANGIOGRAM;  Surgeon: [place] Blanks, MD;  Location: [facility] CATH LAB;  Service: Cardiovascular;  Laterality: N/A;  . LEFT HEART CATHETERIZATION WITH CORONARY/GRAFT ANGIOGRAM N/A 12/10/2013   Procedure: LEFT HEART CATHETERIZATION WITH Beatrix Fetters;  Surgeon: [name], MD;  Location: [facility] CATH LAB;  Service: Cardiovascular;  Laterality: N/A;  . OVARY SURGERY     ovarian cancer  . POLYPECTOMY  03/09/2019   Procedure: POLYPECTOMY;  Surgeon: Rogene Houston, MD;  [facility];  Service: Endoscopy;;  cecal   . POLYPECTOMY N/A 09/26/2019   Procedure: DUODENAL POLYPECTOMY;  Surgeon: Rogene Houston, MD;  [facility];  Service: Endoscopy;  Laterality: N/A;  . REVISION OF ARTERIOVENOUS GORETEX GRAFT N/A 02/24/2017   Procedure: REVISION OF ARTERIOVENOUS GORETEX  GRAFT (RESECTION);  Surgeon: Katha Cabal, MD;  Location: [facility] ORS;  Service: Vascular;  Laterality: N/A;  Earney Mallet N/A 10/15/2013   Procedure: Fistulogram;  Surgeon: Serafina Mitchell, MD;  Location: [facility] CATH LAB;  Service: Cardiovascular;  Laterality: N/A;  . THROMBECTOMY / ARTERIOVENOUS GRAFT REVISION  2011   left upper arm  . TUBAL LIGATION  1980's  . UPPER EXTREMITY ANGIOGRAPHY Bilateral 12/06/2016   Procedure: Upper Extremity Angiography;  Surgeon: Katha Cabal, MD;  Location: [place] [facility];  Service: Cardiovascular;  Laterality: Bilateral;  . UPPER EXTREMITY INTERVENTION Left 06/06/2017   Procedure: UPPER EXTREMITY INTERVENTION;  Surgeon: Katha Cabal, MD;  Location: [place] [facility];  Service: Cardiovascular;  Laterality: Left;    Current Medications: Outpatient Medications Prior to Visit  Medication Sig Dispense Refill  . ALPRAZolam (XANAX) 0.5 MG tablet Take 0.5 mg by mouth at bedtime as needed for sleep.     Marland Kitchen aspirin EC 81 MG tablet Take 1 tablet (81 mg total) by mouth daily. Restart on 03/13/19    . [START ON 02/09/2020] diphenhydrAMINE (BENADRYL ALLERGY) 25 MG tablet Take 1 tablet (25 mg total) by mouth once for 1 dose. Take 1 tablet 1 hour prior to scheduled CT 30 tablet 0  . doxycycline (DORYX) 100 MG EC tablet Take 100 mg by mouth 2 (two) times daily.    . fluticasone (FLONASE) 50 MCG/ACT nasal spray Place 1 spray at bedtime as needed into both nostrils for allergies.     Marland Kitchen gabapentin (NEURONTIN) 100 MG capsule Take 100 mg by mouth 2 (two) times daily.     . isosorbide mononitrate (IMDUR) 60 MG 24 hr tablet Take 1 tablet (60 mg total) by mouth 2 (two) times daily. 60 tablet 6  . lidocaine-prilocaine (EMLA) cream Apply 1 application topically every Monday, Wednesday, and Friday. Prior to dialysis    . loratadine (CLARITIN) 10 MG tablet Take 1 tablet (10 mg total) by mouth daily as needed for allergies. 30 tablet 0  . multivitamin (RENA-VIT) TABS tablet  Take 1 tablet by mouth daily.    . nitroGLYCERIN (NITROSTAT) 0.4 MG SL tablet DISSOLVE ONE TABLET UNDER THE TONGUE EVERY 5 MINUTES AS NEEDED FOR CHEST PAIN.  DO NOT EXCEED A TOTAL OF 3 DOSES IN 15 MINUTES 30 tablet 2  . omeprazole (PRILOSEC) 40 MG capsule Take 1 capsule (40 mg total) by mouth daily before breakfast. 30 capsule 5  . ondansetron (ZOFRAN-ODT) 4 MG disintegrating tablet Take 4 mg by mouth every 8 (eight) hours as needed for nausea or vomiting.     . sevelamer carbonate (RENVELA) 800 MG tablet Take 1,600-2,400 mg by mouth 3 (three) times daily with meals. Takes 3 tablets with meals and 2 tablets with snacks    . simvastatin (ZOCOR) 20 MG tablet TAKE 1 TABLET BY MOUTH ONCE DAILY AT BEDTIME (Patient taking differently: Take 20 mg by mouth at bedtime. ) 90 tablet 2  . triamcinolone cream (KENALOG) 0.1 % Apply 1 application topically daily as needed (Dry skin/ itching).     Marland Kitchen amiodarone (PACERONE) 200 MG tablet Take 1 tablet (200 mg total) by mouth 2 (two) times daily. 180 tablet 1  . [START ON 02/09/2020] predniSONE (DELTASONE) 50 MG tablet To be used for CT contrast allergy . Take 1 tablet by mouth 13 hours before scheduled CT Take 1 tablet by mouth 7 hours before scheduled CT Take 1 tablet by mouth 1 hour before scheduled CT (Patient not taking: Reported on 12/17/2019) 3 tablet 0   No facility-administered medications prior to visit.     Allergies:   Aspirin, Penicillins, Amlodipine, Bactrim [sulfamethoxazole-trimethoprim], Contrast media [iodinated diagnostic agents], Iron, Nitrofurantoin, Tylenol [acetaminophen], Gabapentin, Hydralazine, Dexilant [dexlansoprazole], Levaquin [levofloxacin in d5w], Morphine and related, Plavix [clopidogrel bisulfate], Protonix [pantoprazole sodium], and Venofer [ferric oxide]   Social History   Socioeconomic History  . Marital status: Married    Spouse name: [name]  . Number of children: Not on file  . Years of education: Not on file  . Highest  education level: Not on file  Occupational History  . Not on file  Tobacco Use  . Smoking status: Never Smoker  . Smokeless tobacco: Never  Used  Substance and Sexual Activity  . Alcohol use: No    Alcohol/week: 0.0 standard drinks  . Drug use: No  . Sexual activity: Yes    Birth control/protection: Surgical  Other Topics Concern  . Not on file  Social History Narrative   Lives in [place], [place] with husband.  Dialysis pt - [facility].   Social Determinants of Health   Financial Resource Strain:   . Difficulty of Paying Living Expenses:   Food Insecurity:   . Worried About Charity fundraiser in the Last Year:   . Arboriculturist in the Last Year:   Transportation Needs:   . Film/video editor (Medical):   Marland Kitchen Lack of Transportation (Non-Medical):   Physical Activity:   . Days of Exercise per Week:   . Minutes of Exercise per Session:   Stress:   . Feeling of Stress :   Social Connections:   . Frequency of Communication with Friends and Family:   . Frequency of Social Gatherings with Friends and Family:   . Attends Religious Services:   . Active Member of Clubs or Organizations:   . Attends Archivist Meetings:   Marland Kitchen Marital Status:      Family History:  The patient's family history includes Colon cancer (age of onset: 80) in her child; Diabetes in her brother, father, mother, and sister; Heart attack in her brother, brother, and mother; Heart disease in her father and mother; Hyperlipidemia in her brother, father, and mother; Hypertension in her father, mother, sister, and sister; Other in an other family member.   Review of Systems:   Please see the history of present illness.     General:  No chills, fever, night sweats or weight changes.  Cardiovascular:  No dyspnea on exertion, edema, orthopnea, paroxysmal nocturnal dyspnea. Positive for chest pain and palpitations.  Dermatological: No rash, lesions/masses Respiratory: No cough, dyspnea Urologic: No hematuria,  dysuria Abdominal:   No nausea, vomiting, diarrhea, bright red blood per rectum, melena, or hematemesis Neurologic:  No visual changes, wkns, changes in mental status. All other systems reviewed and are otherwise negative except as noted above.   Physical Exam:    VS:  BP (!) 132/58   Pulse 76   Ht 5\' 1"  (1.549 m)   Wt 136 lb 3.2 oz (61.8 kg)   SpO2 94%   BMI 25.73 kg/m    General: Well developed, elderly female appearing in no acute distress. Head: Normocephalic, atraumatic, sclera non-icteric.  Neck: No carotid bruits. JVD not elevated.  Lungs: Respirations regular and unlabored, without wheezes or rales.  Heart: Regular rate and rhythm. No S3 or S4.  No murmur, no rubs, or gallops appreciated. Abdomen: Soft, non-tender, non-distended. No obvious abdominal masses. Msk:  Strength and tone appear normal for age. No obvious joint deformities or effusions. Extremities: No clubbing or cyanosis. No lower extremity edema.  Distal pedal pulses are 2+ bilaterally. Neuro: Alert and oriented X 3. Moves all extremities spontaneously. No focal deficits noted. Psych:  Responds to questions appropriately with a normal affect. Skin: No rashes or lesions noted  Wt Readings from Last 3 Encounters:  12/17/19 136 lb 3.2 oz (61.8 kg)  12/13/19 130 lb (59 kg)  11/28/19 137 lb (62.1 kg)      Studies/Labs Reviewed:   EKG:  EKG is not ordered today. EKG from 12/13/2019 is reviewed which shows NSR, HR 65 with incomplete LBBB  And LVH with associated repol abnormalities.  Recent Labs: 12/13/2019: ALT 9; BUN 16; Creatinine, Ser 4.52; Hemoglobin 9.1; Platelets 289; Potassium 3.2; Sodium 136   Lipid Panel    Component Value Date/Time   CHOL 93 10/16/2017 0539   TRIG 56 10/16/2017 0539   HDL 56 10/16/2017 0539   CHOLHDL 1.7 10/16/2017 0539   VLDL 11 10/16/2017 0539   LDLCALC 26 10/16/2017 0539    Additional studies/ records that were reviewed today include:   Cardiac Catheterization:  07/2018  Ost RPDA to RPDA lesion is 75% stenosed.  Mid LM to Dist LM lesion is 60% stenosed.  Ost Ramus to Ramus lesion is 80% stenosed.  Ost Cx to Mid Cx lesion is 99% stenosed.  Prox LAD lesion is 90% stenosed.  Ost 1st Diag to 1st Diag lesion is 100% stenosed.  Ost RCA to Prox RCA lesion is 100% stenosed.  LIMA graft was visualized by angiography and is large.  The graft exhibits no disease.  SVG graft was visualized by angiography and is large.  The graft exhibits no disease.  SVG graft was visualized by angiography and is normal in caliber.  SVG graft was visualized by angiography and is large.  The graft exhibits no disease.  The left ventricular systolic function is normal.  LV end diastolic pressure is mildly elevated.  The left ventricular ejection fraction is 55-65% by visual estimate.   1. Severe 3 vessel occlusive CAD    - 60% distal left main    - 90% mid LAD    - 100% first diagonal at prior stent site    - 99% proximal LCx at prior stent site    - 80% ramus intermediate.    - 100% ostial RCA at prior stent site    - 75% proximal PDA 2. Patent LIMA to the LAD 3. Patent SVG to ramus intermediate 4. Patent SVG to OM 5. Patent SVG to the distal RCA. 6. Good LV function 7. Mildly elevated LVEDP  Plan: Compared to prior cath in April 2018 the diagonal is now occluded. There is progression of disease in the PDA. It is unclear if this is the reason for her chest pain. She is severely hypertensive and EDP is mildly elevated. I would recommend medical management with good BP control. If she has refractory angina despite good BP and volume management we could consider PCI of the PDA but this would have to be done through the SVG and in general she has not responded well to stenting in the past with all prior stents occluded. I will increase her Coreg dose. She will need dialysis in am.    Echocardiogram: 12/2018 IMPRESSIONS    1. The left ventricle has  normal systolic function with an ejection  fraction of 60-65%. The cavity size was normal. There is mild concentric  left ventricular hypertrophy. Left ventricular diastolic Doppler  parameters are consistent with impaired  relaxation. Elevated left ventricular end-diastolic pressure No evidence  of left ventricular regional wall motion abnormalities.  2. Left atrial size was mildly dilated.  3. The mitral valve is grossly normal. Mild thickening of the mitral  valve leaflet. There is mild mitral annular calcification present. Mitral  valve regurgitation is mild to moderate by color flow Doppler.  4. The tricuspid valve is grossly normal.  5. The aortic valve is tricuspid. Mild thickening of the aortic valve.  6. Moderate aortic annular calcification.  7. The aortic root is normal in size and structure.    Assessment:    1.  Coronary artery disease of bypass graft of native heart with stable angina pectoris (HCC)   2. AF (paroxysmal atrial fibrillation) ([place])   3. Mixed hyperlipidemia   4. Essential hypertension   5. Medication management   6. ESRD (end stage renal disease) ([place])      Plan:   In order of problems listed above:  1. CAD - she is s/p CABG in 2013 with DES to D1 in 10/2016 with cath showing patent LIMA-LAD, SVG-RI, SVG-Mrg and SVG-RCA. Most recent cath in 07/2018 showedpatentgrafts with occlusion of D1 at prior stent site and progression of PDA disease with medical management recommended. - she does have stable angina but denies any progression of symptoms.  - continue ASA 81mg  daily, Imdur 60mg  BID and statin therapy.   2. Paroxysmal Atrial Fibrillation - she is currently on Amiodarone 200mg  BID following her recent recurrence. Denies any palpitations since. She is no longer on Coreg given hypotension with low-dose Coreg in the past. Will have her continue on the higher dose of Amiodarone for a 4-week course, then reduce to 200mg  daily. Recheck TSH and LFT's  in 4-6 weeks. PFT's were performed earlier today with results pending.  - she is no longer on anticoagulation given recurrent GIB and is aware of the increased thromboembolic risk.   3. HLD - previously followed by PCP but no recent labs are available for review. She remains on Simvastatin 20mg  daily and given concurrent use of Amiodarone, the max dose should be 10mg  daily. LFT's recently checked and within a normal range. Will request a repeat FLP with upcoming labs. If LDL not below 70, would recommend switching to Crestor.   4. HTN - BP is well-controlled at 132/58 during today's visit. BP has been managed conservatively given issues of hypotension and dizziness with HD.    5. ESRD - on HD - MWF schedule.    Medication Adjustments/Labs and Tests Ordered: Current medicines are reviewed at length with the patient today.  Concerns regarding medicines are outlined above.  Medication changes, Labs and Tests ordered today are listed in the Patient Instructions below. Patient Instructions  Medication Instructions:  Your physician has recommended you make the following change in your medication:  Decrease Amiodarone to 200 mg Daily on July 2   *If you need a refill on your cardiac medications before your next appointment, please call your pharmacy*   Lab Work: Your physician recommends that you return for lab work in: 4-6 Weeks   If you have labs (blood work) drawn today and your tests are completely normal, you will receive your results only by: Marland Kitchen MyChart Message (if you have MyChart) OR . A paper copy in the mail If you have any lab test that is abnormal or we need to change your treatment, we will call you to review the results.   Testing/Procedures: NONE   Follow-Up: At [facility], you and your health needs are our priority.  As part of our continuing mission to provide you with exceptional heart care, we have created designated Provider Care Teams.  These Care Teams include  your primary Cardiologist (physician) and Advanced Practice Providers (APPs -  Physician Assistants and Nurse Practitioners) who all work together to provide you with the care you need, when you need it.  We recommend signing up for the patient portal called "MyChart".  Sign up information is provided on this After Visit Summary.  MyChart is used to connect with patients for Virtual Visits (Telemedicine).  Patients are  able to view lab/test results, encounter notes, upcoming appointments, etc.  Non-urgent messages can be sent to your provider as well.   To learn more about what you can do with MyChart, go to [email or web address].    Your next appointment:    As planned   The format for your next appointment:   In Person  Provider:   Kate Sable, MD   Other Instructions Thank you for choosing [facility]!        Signed, Erma Heritage, PA-C  12/17/2019 8:03 PM    [place] [address] [place]  [address] Phone: [phone] Fax: [phone]

## 2019-12-17 NOTE — Patient Instructions (Signed)
Medication Instructions:  Your physician has recommended you make the following change in your medication:  Decrease Amiodarone to 200 mg Daily on July 2   *If you need a refill on your cardiac medications before your next appointment, please call your pharmacy*   Lab Work: Your physician recommends that you return for lab work in: 4-6 Weeks   If you have labs (blood work) drawn today and your tests are completely normal, you will receive your results only by: Marland Kitchen MyChart Message (if you have MyChart) OR . A paper copy in the mail If you have any lab test that is abnormal or we need to change your treatment, we will call you to review the results.   Testing/Procedures: NONE   Follow-Up: At [facility], you and your health needs are our priority.  As part of our continuing mission to provide you with exceptional heart care, we have created designated Provider Care Teams.  These Care Teams include your primary Cardiologist (physician) and Advanced Practice Providers (APPs -  Physician Assistants and Nurse Practitioners) who all work together to provide you with the care you need, when you need it.  We recommend signing up for the patient portal called "MyChart".  Sign up information is provided on this After Visit Summary.  MyChart is used to connect with patients for Virtual Visits (Telemedicine).  Patients are able to view lab/test results, encounter notes, upcoming appointments, etc.  Non-urgent messages can be sent to your provider as well.   To learn more about what you can do with MyChart, go to [email or web address].    Your next appointment:    As planned   The format for your next appointment:   In Person  Provider:   Kate Sable, MD   Other Instructions Thank you for choosing [facility]!

## 2019-12-18 ENCOUNTER — Other Ambulatory Visit (HOSPITAL_COMMUNITY): Payer: Medicare HMO

## 2019-12-20 ENCOUNTER — Encounter (HOSPITAL_COMMUNITY): Payer: Medicare HMO

## 2019-12-24 ENCOUNTER — Telehealth: Payer: Self-pay | Admitting: *Deleted

## 2019-12-24 DIAGNOSIS — J439 Emphysema, unspecified: Secondary | ICD-10-CM

## 2019-12-24 NOTE — Telephone Encounter (Signed)
-----   Message from Charlie Pitter, [place] sent at 12/18/2019  7:38 AM EDT ----- Please let pt know PFTs showed findings possibly consistent with emphysema. Since she is on amiodarone, would like to refer her to pulmonary. Thanks. Will also cc to primary cardiologist as Juluis Rainier to advise on keeping amiodarone dose the same as this was just increased per 6/4 ED visit. Pt saw [place] yesterday in clinic. Dayna Dunn PA-C

## 2019-12-24 NOTE — Telephone Encounter (Signed)
Pt.notified

## 2019-12-26 ENCOUNTER — Encounter (INDEPENDENT_AMBULATORY_CARE_PROVIDER_SITE_OTHER): Payer: Self-pay | Admitting: Gastroenterology

## 2019-12-26 ENCOUNTER — Other Ambulatory Visit: Payer: Self-pay

## 2019-12-26 ENCOUNTER — Ambulatory Visit (INDEPENDENT_AMBULATORY_CARE_PROVIDER_SITE_OTHER): Payer: Medicare HMO | Admitting: Gastroenterology

## 2019-12-26 VITALS — BP 148/63 | HR 75 | Temp 99.0°F | Ht 61.0 in | Wt 135.5 lb

## 2019-12-26 DIAGNOSIS — D649 Anemia, unspecified: Secondary | ICD-10-CM

## 2019-12-26 DIAGNOSIS — R195 Other fecal abnormalities: Secondary | ICD-10-CM

## 2019-12-26 NOTE — Progress Notes (Signed)
Patient profile: Shawna [name] is a 79 y.o. female seen for evaluation of dark stools. Last seen in clinic for endoscopy March 2021.  Called today for urgent appointment.  History of Present Illness: Shawna [name] is seen today for having dark stools over the past 2 days, reports these began last night when she had 1 soft very dark brown stool yesterday evening and additional very dark stool this morning prompting her to make an appointment today.  She states her stomach feels "sore" but denies significant abdominal pain.  Reports she felt weak yesterday but feels less weak today. She had dialysis yesterday.  She has some chronic bloating but this does not seem worse than normal. She denies any GERD symptoms currently.  Prior to 48 hours ago her stools were brown in color. Reports prior issues w/ GI bleeding. Denies nsaid use. She is eating okay.   Currently denies any chest pain, dizziness, lightheadedness.  Wt Readings from Last 3 Encounters:  12/26/19 135 lb 8 oz (61.5 kg)  12/17/19 136 lb 3.2 oz (61.8 kg)  12/13/19 130 lb (59 kg)     Last Colonoscopy: August 2020 colonoscopy-diverticulosis entire colon, end-to-side ileocolonic anastomosis, 2 small polyps proximal ascending, large polyp transverse.   Last Endoscopy: 09/2019-The examined esophagus was normal. The Z-line was regular and was found 34 cm from the incisors. A 3 cm hiatal hernia was present. The entire examined stomach was normal. The duodenal bulb was normal. A single diminutive sessile polyp was found in the second portion of the duodenum. Biopsies were taken with a cold forceps for histology. The third portion of the duodenum was normal   CAPSULE - 03/09/19- Bulbar duodenitis without stigmata of bleed. 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   Past Medical History:  Past Medical History:  Diagnosis Date  . Acute on chronic respiratory failure with hypoxia  ([place]) 10/10/2016  . Anxiety   . Arthritis   . AVM (arteriovenous malformation) of colon   . CAD (coronary artery disease)    a. s/p CABG in 2013 b. DES to D1 in 10/2016. c. cath in 07/2018 showing patent grafts with occlusion of D1 at prior stent site and progression of PDA disease --> medical management recommended  . Carotid artery disease ([place])    a. 07-37% LICA, 07/624   . Chronic anemia   . Chronic bronchitis ([place])   . Chronic diastolic CHF (congestive heart failure) ([place])    a. 02/2012 Echo EF 60-65%, nl wall motion, Gr 1 DD, mod MR  . Colon cancer ([place]) 1992  . Esophageal stricture   . ESRD on hemodialysis ([place])    ESRD due to HTN, started dialysis 2011 and gets HD at [facility] with Dr Hinda Lenis on MWF schedule.  Access is LUA AVF as of Sept 2014.   Marland Kitchen GERD (gastroesophageal reflux disease)   . High cholesterol 12/2011  . History of blood transfusion 07/2011; 12/2011; 01/2012 X 2; 04/2012  . History of gout   . History of lower GI bleeding   . Hypertension   . Iron deficiency anemia   . Mitral regurgitation    a. Moderate by echo, 02/2012  . Myocardial infarction ([place])   . NSVT (nonsustained ventricular tachycardia) ([place])   . Ovarian cancer ([place]) 1992  . PAF (paroxysmal atrial fibrillation) ([place])   . Pneumonia ~ 2009  . PUD (peptic ulcer disease)   . TIA (transient ischemic attack)  Problem List: Patient Active Problem List   Diagnosis Date Noted  . Pulmonary edema 10/14/2019  . Adenomatous duodenal polyp 09/10/2019  . History of GI bleed 09/10/2019  . Angina pectoris ([place]) 06/05/2019  . Chest pain 06/03/2019  . Rectal bleeding 05/14/2019  . Small intestinal bacterial overgrowth 05/14/2019  . Iron deficiency anemia 04/02/2019  . GI bleed 03/08/2019  . Gastrointestinal hemorrhage with melena 03/06/2019  . Acute respiratory failure with hypoxia ([place]) 12/25/2018  . Elevated troponin 12/14/2018  . Chest pain at rest 07/13/2018  . Hand steal syndrome ([place]) 08/01/2017    . Anemia 07/14/2017  . Coronary artery disease of bypass graft of native heart with stable angina pectoris ([place]) 06/05/2017  . Mesenteric ischemia ([place])   . Diverticulitis   . SBO (small bowel obstruction) ([place]) 03/22/2017  . Enteritis   . Complication of vascular access for dialysis 03/19/2017  . Preoperative clearance 01/25/2017  . Symptomatic anemia 10/24/2016  . H/O non-ST elevation myocardial infarction (NSTEMI) 10/24/2016  . Fluid overload 10/10/2016  . Complication from renal dialysis device 10/10/2016  . Non-ST elevation MI (NSTEMI) ([place])   . Encounter for fitting and adjustment of vascular catheter   . Heme positive stool   . Demand ischemia ([place]) 07/27/2016  . Hypertensive emergency 07/08/2016  . Acute on chronic respiratory failure with hypoxia ([place])   . Cardiac arrest ([place])   . Palliative care encounter   . Goals of care, counseling/discussion   . Hypertensive crisis without congestive heart failure 05/09/2016  . Acute pulmonary edema ([place]) 04/06/2016  . Acute respiratory failure ([place]) 04/06/2016  . Hypertensive crisis 01/27/2016  . History of colon cancer 01/27/2016  . History of ovarian cancer 01/27/2016  . Hypertensive urgency 01/27/2016  . AF (paroxysmal atrial fibrillation) ([place]) 10/14/2015  . Coronary angioplasty status 10/14/2015  . Malignant neoplasm of right ovary ([place]) 10/14/2015  . Narrow complex tachycardia ([place]) 09/08/2015  . SVT (supraventricular tachycardia) ([place]) 09/08/2015  . Influenza A 08/30/2015  . Acute on chronic diastolic CHF (congestive heart failure) ([place]) 05/04/2015  . Unstable angina ([place]) 05/03/2015  . Essential hypertension   . Pain in joint, lower leg 08/14/2014  . Dacryocystitis 05/29/2013  . Chronic diastolic CHF (congestive heart failure) ([place]) 03/22/2013  . GI bleeding 03/21/2013  . Acute blood loss anemia 03/21/2013  . Vaginal odor 03/12/2013  . Vaginal discharge 03/12/2013  . Occlusion and stenosis of carotid artery without  mention of cerebral infarction 01/24/2013  . Hx of CABG 07/05/2012  . Carotid artery disease ([place]) 07/05/2012  . Mitral regurgitation 06/12/2012  . Pneumonia 06/09/2012  . Non-STEMI (non-ST elevated myocardial infarction) ([place]) 06/08/2012  . Ischemic chest pain ([place]) 03/01/2012  . 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 GERD (gastroesophageal reflux disease) 01/09/2012  . HLD (hyperlipidemia) 01/05/2012  . Atherosclerotic heart disease of native coronary artery without angina pectoris 12/16/2011  . ESRD on hemodialysis ([place]) 12/16/2011  . Anxiety disorder 05/04/2011  . Anemia in chronic kidney disease 04/29/2011  . Secondary hyperparathyroidism of renal origin ([place]) 04/29/2011  . End stage renal disease ([place]) 04/29/2011  . Gout 04/29/2011  . Hypertensive chronic kidney disease with stage 5 chronic kidney disease or end stage renal disease ([place]) 04/29/2011    Past Surgical History: Past Surgical History:  Procedure Laterality Date  . A/V SHUNTOGRAM Left 03/19/2019   Procedure: A/V SHUNTOGRAM;  Surgeon: Katha Cabal, MD;  Location: [facility] CV LAB;  Service: Cardiovascular;  Laterality: Left;  . ABDOMINAL HYSTERECTOMY  [place]  06/1990  . AV FISTULA PLACEMENT  07/2009   left upper arm  . AV FISTULA PLACEMENT Right 09/06/2016   Procedure: RIGHT FOREARM ARTERIOVENOUS (AV) GRAFT;  Surgeon: Elam Dutch, MD;  Location[facility];  Service: Vascular;  Laterality: Right;  . AV FISTULA PLACEMENT N/A 02/24/2017   Procedure: INSERTION OF ARTERIOVENOUS (AV) GORE-TEX GRAFT ARM (BRACHIAL AXILLARY);  Surgeon: Katha Cabal, MD;  Location: [facility] ORS;  Service: Vascular;  Laterality: N/A;  . [place] Right 09/06/2016   Procedure: REMOVAL OF Right Arm ARTERIOVENOUS GORETEX GRAFT and Vein Patch angioplasty of brachial artery;  Surgeon: Angelia Mould, MD;  Location: [facility];  Service: Vascular;  Laterality: Right;  . BIOPSY  09/26/2019    Procedure: BIOPSY;  Surgeon: Rogene Houston, MD;  [facility];  Service: Endoscopy;;  . COLON RESECTION  1992  . COLON SURGERY    . COLONOSCOPY N/A 03/09/2019   Procedure: COLONOSCOPY;  Surgeon: Rogene Houston, MD;  [facility];  Service: Endoscopy;  Laterality: N/A;  . CORONARY ANGIOPLASTY WITH STENT PLACEMENT  12/15/11   "2"  . CORONARY ANGIOPLASTY WITH STENT PLACEMENT  y/2013   "1; makes total of 3" (05/02/2012)  . CORONARY ARTERY BYPASS GRAFT  06/13/2012   Procedure: CORONARY ARTERY BYPASS GRAFTING (CABG);  Surgeon: Grace Isaac, MD;  Location: [facility];  Service: Open Heart Surgery;  Laterality: N/A;  cabg x four;  using left internal mammary artery, and left leg greater saphenous vein harvested endoscopically  . CORONARY STENT INTERVENTION N/A 10/13/2016   Procedure: Coronary Stent Intervention;  Surgeon: Troy Sine, MD;  Location: [facility] CV LAB;  Service: Cardiovascular;  Laterality: N/A;  . DIALYSIS/PERMA CATHETER REMOVAL N/A 04/18/2017   Procedure: DIALYSIS/PERMA CATHETER REMOVAL;  Surgeon: Katha Cabal, MD;  Location: [facility] CV LAB;  Service: Cardiovascular;  Laterality: N/A;  . DILATION AND CURETTAGE OF UTERUS    . ESOPHAGOGASTRODUODENOSCOPY  01/20/2012   Procedure: ESOPHAGOGASTRODUODENOSCOPY (EGD);  Surgeon: Ladene Artist, MD,FACG;  Location: [facility] ENDOSCOPY;  Service: Endoscopy;  Laterality: N/A;  . ESOPHAGOGASTRODUODENOSCOPY N/A 03/26/2013   Procedure: ESOPHAGOGASTRODUODENOSCOPY (EGD);  Surgeon: Irene Shipper, MD;  Location[facility] ENDOSCOPY;  Service: Endoscopy;  Laterality: N/A;  . ESOPHAGOGASTRODUODENOSCOPY N/A 04/30/2015   Procedure: ESOPHAGOGASTRODUODENOSCOPY (EGD);  Surgeon: Rogene Houston, MD;  [facility];  Service: Endoscopy;  Laterality: N/A;  1pm - moved to 10/20 @ 1:10  . ESOPHAGOGASTRODUODENOSCOPY N/A 07/29/2016   Procedure: ESOPHAGOGASTRODUODENOSCOPY (EGD);  Surgeon: Manus Gunning, MD;  Location: [facility];  Service: Gastroenterology;  Laterality: N/A;  enteroscopy  . ESOPHAGOGASTRODUODENOSCOPY N/A 09/26/2019   Procedure: ESOPHAGOGASTRODUODENOSCOPY (EGD);  Surgeon: Rogene Houston, MD;  [facility];  Service: Endoscopy;  Laterality: N/A;  1250  . GIVENS CAPSULE STUDY N/A 03/07/2019   Procedure: GIVENS CAPSULE STUDY;  Surgeon: Rogene Houston, MD;  [facility];  Service: Endoscopy;  Laterality: N/A;  7:30  . INTRAOPERATIVE TRANSESOPHAGEAL ECHOCARDIOGRAM  06/13/2012   Procedure: INTRAOPERATIVE TRANSESOPHAGEAL ECHOCARDIOGRAM;  Surgeon: Grace Isaac, MD;  Location: [facility];  Service: Open Heart Surgery;  Laterality: N/A;  . IR GENERIC HISTORICAL  07/26/2016   IR FLUORO GUIDE CV LINE RIGHT 07/26/2016 Greggory Keen, MD [facility]  . IR GENERIC HISTORICAL  07/26/2016   IR US GUIDE VASC ACCESS RIGHT 07/26/2016 Greggory Keen, MD [facility]  . IR GENERIC HISTORICAL  08/02/2016   IR US GUIDE VASC  ACCESS RIGHT 08/02/2016 Greggory Keen, MD [facility]  . IR GENERIC HISTORICAL  08/02/2016   IR FLUORO GUIDE CV LINE RIGHT 08/02/2016 Greggory Keen, MD [facility]  . IR RADIOLOGY PERIPHERAL GUIDED IV START  03/28/2017  . IR US GUIDE VASC ACCESS RIGHT  03/28/2017  . LEFT HEART CATH AND CORONARY ANGIOGRAPHY N/A 09/20/2016   Procedure: Left Heart Cath and Coronary Angiography;  Surgeon: [name], MD;  Location: [place] [facility] [place] [facility];  Service: Cardiovascular;  Laterality: N/A;  . LEFT HEART CATH AND CORS/GRAFTS ANGIOGRAPHY N/A 10/13/2016   Procedure: Left Heart Cath and Cors/Grafts Angiography;  Surgeon: Troy Sine, MD;  Location: [facility] CV LAB;  Service: Cardiovascular;  Laterality: N/A;  . LEFT HEART CATH AND CORS/GRAFTS ANGIOGRAPHY N/A 07/13/2018   Procedure: LEFT HEART CATH AND CORS/GRAFTS ANGIOGRAPHY;  Surgeon: Martinique, Peter M, MD;  Location: [place] [facility];  Service: Cardiovascular;  Laterality: N/A;  . LEFT HEART CATHETERIZATION WITH CORONARY ANGIOGRAM N/A  12/15/2011   Procedure: LEFT HEART CATHETERIZATION WITH CORONARY ANGIOGRAM;  Surgeon: Burnell Blanks, MD;  Location: [facility] C[facility] LAB;  Service: Cardiovascular;  Laterality: N/A;  . LEFT HEART CATHETERIZATION WITH CORONARY ANGIOGRAM N/A 01/10/2012   Procedure: LEFT HEART CATHETERIZATION WITH CORONARY ANGIOGRAM;  Surgeon: Peter M Martinique, MD;  Location: [facility] CATH LAB;  Service: Cardiovascular;  Laterality: N/A;  . LEFT HEART CATHETERIZATION WITH CORONARY ANGIOGRAM N/A 06/08/2012   Procedure: LEFT HEART CATHETERIZATION WITH CORONARY ANGIOGRAM;  Surgeon: Burnell Blanks, MD;  Location: [facility] CATH LAB;  Service: Cardiovascular;  Laterality: N/A;  . LEFT HEART CATHETERIZATION WITH CORONARY/GRAFT ANGIOGRAM N/A 12/10/2013   Procedure: LEFT HEART CATHETERIZATION WITH Beatrix Fetters;  Surgeon: [name], MD;  Location: [facility] CATH LAB;  Service: Cardiovascular;  Laterality: N/A;  . OVARY SURGERY     ovarian cancer  . POLYPECTOMY  03/09/2019   Procedure: POLYPECTOMY;  Surgeon: Rogene Houston, MD;  [facility];  Service: Endoscopy;;  cecal   . POLYPECTOMY N/A 09/26/2019   Procedure: DUODENAL POLYPECTOMY;  Surgeon: Rogene Houston, MD;  [facility];  Service: Endoscopy;  Laterality: N/A;  . REVISION OF ARTERIOVENOUS GORETEX GRAFT N/A 02/24/2017   Procedure: REVISION OF ARTERIOVENOUS GORETEX GRAFT (RESECTION);  Surgeon: Katha Cabal, MD;  Location: [facility] ORS;  Service: Vascular;  Laterality: N/A;  Earney Mallet N/A 10/15/2013   Procedure: Fistulogram;  Surgeon: Serafina Mitchell, MD;  Location: [facility] CATH LAB;  Service: Cardiovascular;  Laterality: N/A;  . THROMBECTOMY / ARTERIOVENOUS GRAFT REVISION  2011   left upper arm  . TUBAL LIGATION  1980's  . UPPER EXTREMITY ANGIOGRAPHY Bilateral 12/06/2016   Procedure: Upper Extremity Angiography;  Surgeon: Katha Cabal, MD;  Location: [place] CV LAB;  Service: Cardiovascular;  Laterality: Bilateral;  . UPPER  EXTREMITY INTERVENTION Left 06/06/2017   Procedure: UPPER EXTREMITY INTERVENTION;  Surgeon: Katha Cabal, MD;  Location: [facility] CV LAB;  Service: Cardiovascular;  Laterality: Left;    Allergies: Allergies  Allergen Reactions  . Aspirin Other (See Comments)    High Doses Mess up her stomach; "makes my bowels have blood in them". Takes 81 mg EC Aspirin   . Penicillins Other (See Comments)    SYNCOPE? , "makes me real weak when I take it; like I'll pass out"  Has patient had a PCN reaction causing immediate rash, facial/tongue/throat swelling, SOB or lightheadedness with hypotension: Yes Has patient had a PCN reaction causing severe rash involving mucus membranes  or skin necrosis: no Has patient had a PCN reaction that required hospitalization no Has patient had a PCN reaction occurring within the last 10 years: no If all of the above  . Amlodipine Swelling  . Bactrim [Sulfamethoxazole-Trimethoprim] Rash  . Contrast Media [Iodinated Diagnostic Agents] Itching  . Iron Itching and Other (See Comments)    "they gave me iron in dialysis; had to give me Benadryl cause I had to have the iron" (05/02/2012)  . Nitrofurantoin Hives  . Tylenol [Acetaminophen] Itching and Other (See Comments)    Makes her feet on fire per pt  . Gabapentin Other (See Comments)    Unknown reaction  . Hydralazine Itching  . Dexilant [Dexlansoprazole] Other (See Comments)    Upset stomach  . Levaquin [Levofloxacin In D5w] Rash  . Morphine And Related Itching and Other (See Comments)    Itching in feet  . Plavix [Clopidogrel Bisulfate] Rash  . Protonix [Pantoprazole Sodium] Rash  . Venofer [Ferric Oxide] Itching and Other (See Comments)    Patient reports using Benadryl prior to doses as [place] Medications:  Current Outpatient Medications:  .  ALPRAZolam (XANAX) 0.5 MG tablet, Take 0.5 mg by mouth at bedtime as needed for sleep. , Disp: , Rfl:  .  amiodarone (PACERONE) 200 MG  tablet, Take 1 tablet (200 mg total) by mouth daily. Starting July 2, Disp: 90 tablet, Rfl: 3 .  aspirin EC 81 MG tablet, Take 1 tablet (81 mg total) by mouth daily. Restart on 03/13/19, Disp:  , Rfl:  .  [START ON 02/09/2020] diphenhydrAMINE (BENADRYL ALLERGY) 25 MG tablet, Take 1 tablet (25 mg total) by mouth once for 1 dose. Take 1 tablet 1 hour prior to scheduled CT, Disp: 30 tablet, Rfl: 0 .  doxycycline (DORYX) 100 MG EC tablet, Take 100 mg by mouth 2 (two) times daily., Disp: , Rfl:  .  fluticasone (FLONASE) 50 MCG/ACT nasal spray, Place 1 spray at bedtime as needed into both nostrils for allergies. , Disp: , Rfl:  .  gabapentin (NEURONTIN) 100 MG capsule, Take 100 mg by mouth 2 (two) times daily. , Disp: , Rfl:  .  isosorbide mononitrate (IMDUR) 60 MG 24 hr tablet, Take 1 tablet (60 mg total) by mouth 2 (two) times daily., Disp: 60 tablet, Rfl: 6 .  lidocaine-prilocaine (EMLA) cream, Apply 1 application topically every Monday, Wednesday, and Friday. Prior to dialysis, Disp: , Rfl:  .  loratadine (CLARITIN) 10 MG tablet, Take 1 tablet (10 mg total) by mouth daily as needed for allergies., Disp: 30 tablet, Rfl: 0 .  multivitamin (RENA-VIT) TABS tablet, Take 1 tablet by mouth daily., Disp: , Rfl:  .  nitroGLYCERIN (NITROSTAT) 0.4 MG SL tablet, DISSOLVE ONE TABLET UNDER THE TONGUE EVERY 5 MINUTES AS NEEDED FOR CHEST PAIN.  DO NOT EXCEED A TOTAL OF 3 DOSES IN 15 MINUTES, Disp: 30 tablet, Rfl: 2 .  omeprazole (PRILOSEC) 40 MG capsule, Take 1 capsule (40 mg total) by mouth daily before breakfast., Disp: 30 capsule, Rfl: 5 .  ondansetron (ZOFRAN-ODT) 4 MG disintegrating tablet, Take 4 mg by mouth every 8 (eight) hours as needed for nausea or vomiting. , Disp: , Rfl:  .  sevelamer carbonate (RENVELA) 800 MG tablet, Take 1,600-2,400 mg by mouth 3 (three) times daily with meals. Takes 3 tablets with meals and 2 tablets with snacks, Disp: , Rfl:  .  simvastatin (ZOCOR) 20 MG tablet, TAKE 1 TABLET BY MOUTH  ONCE  DAILY AT BEDTIME (Patient taking differently: Take 20 mg by mouth at bedtime. ), Disp: 90 tablet, Rfl: 2 .  triamcinolone cream (KENALOG) 0.1 %, Apply 1 application topically daily as needed (Dry skin/ itching). , Disp: , Rfl:  .  [START ON 02/09/2020] predniSONE (DELTASONE) 50 MG tablet, To be used for CT contrast allergy . Take 1 tablet by mouth 13 hours before scheduled CT Take 1 tablet by mouth 7 hours before scheduled CT Take 1 tablet by mouth 1 hour before scheduled CT (Patient not taking: Reported on 12/17/2019), Disp: 3 tablet, Rfl: 0   Family History: family history includes Colon cancer (age of onset: 39) in her child; Diabetes in her brother, father, mother, and sister; Heart attack in her brother, brother, and mother; Heart disease in her father and mother; Hyperlipidemia in her brother, father, and mother; Hypertension in her father, mother, sister, and sister; Other in an other family member.    Social History:   reports that she has never smoked. She has never used smokeless tobacco. She reports that she does not drink alcohol and does not use drugs.   Review of Systems: Constitutional: Denies weight loss/weight gain  Eyes: No changes in vision. ENT: No oral lesions, sore throat.  GI: see HPI.  Heme/Lymph: No easy bruising.  CV: No chest pain.  GU: No hematuria.  Integumentary: No rashes.  Neuro: No headaches.  Psych: No depression/anxiety.  Endocrine: No heat/cold intolerance.  Allergic/Immunologic: No urticaria.  Resp: No cough, SOB.  Musculoskeletal: No joint swelling.    Physical Examination: BP (!) 148/63 (BP Location: Right Arm, Patient Position: Sitting, Cuff Size: Large)   Pulse 75   Temp 99 F (37.2 C) (Oral)   Ht 5\' 1"  (1.549 m)   Wt 135 lb 8 oz (61.5 kg)   BMI 25.60 kg/m  Gen: NAD, alert and oriented x 4 HEENT: PEERLA, EOMI, Neck: supple, no JVD Chest: CTA bilaterally, no wheezes, crackles, or other adventitious sounds CV: RRR, no m/g/c/r Abd: soft, NT,  ND, +BS in all four quadrants; no HSM, guarding, ridigity, or rebound tenderness RECTAL-dark brown stool in rectal vault, grossly hemoccult positive.  Ext: no edema, well perfused with 2+ pulses, Skin: no rash or lesions noted on observed skin Lymph: no noted LAD  Data Reviewed:  03/08/2019-Hgb 6.8. INR 1.1. MCV 114.   12/13/19-Hgb 9.1, MCV 116     Assessment/Plan: Ms. Lamore is a 79 y.o. female seen today with dark brown Hemoccult positive stools over the past 24 hours.  She reports being told at dialysis last week her hemoglobin was "low"-baseline per chart review is around 8-9 range over past few months. She has dialysis tomorrow and will have them check her hemoglobin tomorrow morning.  If it is below 7 she will proceed to ER for transfusion. Shawna [name] will plan for capsule study next week to assess for small bowel bleeding. ER precautions given.  Case discussed w/ Dr Laural Golden while patient in office.    Shawna [name] was seen today for follow-up.  Diagnoses and all orders for this visit:  Positive occult stool blood test  Anemia, unspecified type    I personally performed the service, non-incident to. (WP)  Laurine Blazer, [facility]

## 2019-12-26 NOTE — Patient Instructions (Signed)
Please check hemoglobin at dialysis - positive hemoccult on 12/26/19 at GI office. If hgb below 7 please send patient to ER. We will plan to do a capsule study next week.

## 2020-01-24 ENCOUNTER — Telehealth: Payer: Self-pay | Admitting: Cardiovascular Disease

## 2020-01-24 NOTE — Telephone Encounter (Signed)
Per B.Strader, PA-C, we do not have her on any BP meds and the nephrologist should be directing care. Pt agreed

## 2020-01-24 NOTE — Telephone Encounter (Signed)
New message    Patient is at dialysis and they are concerned her bp is not going above 106/65

## 2020-01-31 ENCOUNTER — Telehealth (INDEPENDENT_AMBULATORY_CARE_PROVIDER_SITE_OTHER): Payer: Self-pay | Admitting: *Deleted

## 2020-01-31 NOTE — Telephone Encounter (Signed)
Patient has just left a message that she has a rash around her rectum. She has been using the  Triamcinolone Acetonide , and this is not helping her.  She is asking that we call her in something else. She uses [facility] in [place]. If you do , will you note to pharmacy to call patient and let her know?  Thank you.

## 2020-02-03 NOTE — Telephone Encounter (Signed)
She can try Preparation H cream around her rectum.  It looks like she has an appointment with Dr. Laural Golden in August and he can do rectal exam and recommendations.  Her hemoglobin has improved since her last visit, last hemoglobin reviewed in care everywhere shows hemoglobin 9.1.

## 2020-02-04 NOTE — Telephone Encounter (Signed)
May I ask that you call this patient and give the recommendations given by Katharina Caper  Thank you.

## 2020-02-04 NOTE — Telephone Encounter (Signed)
Called spoke w/ patient, when over recommendations, she said that she will try this to see if it helps . Pt stated that she is have some abd pain. Feeling like she has go to the bathroom BM but when she goes she doesn't have to go.

## 2020-02-06 ENCOUNTER — Institutional Professional Consult (permissible substitution): Payer: Medicare HMO | Admitting: Internal Medicine

## 2020-02-12 ENCOUNTER — Emergency Department (HOSPITAL_COMMUNITY)
Admission: EM | Admit: 2020-02-12 | Discharge: 2020-02-12 | Disposition: A | Discharge status: Home / Self Care | Payer: Medicare HMO | Attending: Emergency Medicine | Admitting: Emergency Medicine

## 2020-02-12 ENCOUNTER — Emergency Department (HOSPITAL_COMMUNITY): Payer: Medicare HMO

## 2020-02-12 ENCOUNTER — Encounter (HOSPITAL_COMMUNITY): Payer: Self-pay

## 2020-02-12 ENCOUNTER — Other Ambulatory Visit: Payer: Self-pay

## 2020-02-12 DIAGNOSIS — R000 Tachycardia, unspecified: Secondary | ICD-10-CM | POA: Insufficient documentation

## 2020-02-12 DIAGNOSIS — I5032 Chronic diastolic (congestive) heart failure: Secondary | ICD-10-CM | POA: Insufficient documentation

## 2020-02-12 DIAGNOSIS — Z992 Dependence on renal dialysis: Secondary | ICD-10-CM | POA: Insufficient documentation

## 2020-02-12 DIAGNOSIS — Z8543 Personal history of malignant neoplasm of ovary: Secondary | ICD-10-CM | POA: Insufficient documentation

## 2020-02-12 DIAGNOSIS — N186 End stage renal disease: Secondary | ICD-10-CM | POA: Insufficient documentation

## 2020-02-12 DIAGNOSIS — I252 Old myocardial infarction: Secondary | ICD-10-CM | POA: Insufficient documentation

## 2020-02-12 DIAGNOSIS — I2510 Atherosclerotic heart disease of native coronary artery without angina pectoris: Secondary | ICD-10-CM | POA: Insufficient documentation

## 2020-02-12 DIAGNOSIS — I132 Hypertensive heart and chronic kidney disease with heart failure and with stage 5 chronic kidney disease, or end stage renal disease: Secondary | ICD-10-CM | POA: Diagnosis not present

## 2020-02-12 DIAGNOSIS — Z8673 Personal history of transient ischemic attack (TIA), and cerebral infarction without residual deficits: Secondary | ICD-10-CM | POA: Diagnosis not present

## 2020-02-12 DIAGNOSIS — R079 Chest pain, unspecified: Secondary | ICD-10-CM | POA: Diagnosis present

## 2020-02-12 LAB — BASIC METABOLIC PANEL
Anion gap: 11 (ref 5–15)
BUN: 16 mg/dL (ref 8–23)
CO2: 27 mmol/L (ref 22–32)
Calcium: 7.6 mg/dL — ABNORMAL LOW (ref 8.9–10.3)
Chloride: 98 mmol/L (ref 98–111)
Creatinine, Ser: 4.84 mg/dL — ABNORMAL HIGH (ref 0.44–1.00)
GFR calc Af Amer: 9 mL/min — ABNORMAL LOW (ref >60)
GFR calc non Af Amer: 8 mL/min — ABNORMAL LOW (ref >60)
Glucose, Bld: 94 mg/dL (ref 70–99)
Potassium: 3.7 mmol/L (ref 3.5–5.1)
Sodium: 136 mmol/L (ref 135–145)

## 2020-02-12 LAB — CBC
HCT: 31.4 % — ABNORMAL LOW (ref 36.0–46.0)
Hemoglobin: 9.3 g/dL — ABNORMAL LOW (ref 12.0–15.0)
MCH: 35.4 pg — ABNORMAL HIGH (ref 26.0–34.0)
MCHC: 29.6 g/dL — ABNORMAL LOW (ref 30.0–36.0)
MCV: 119.4 fL — ABNORMAL HIGH (ref 80.0–100.0)
Platelets: 289 10*3/uL (ref 150–400)
RBC: 2.63 MIL/uL — ABNORMAL LOW (ref 3.87–5.11)
RDW: 19 % — ABNORMAL HIGH (ref 11.5–15.5)
WBC: 7 10*3/uL (ref 4.0–10.5)
nRBC: 0 % (ref 0.0–0.2)

## 2020-02-12 LAB — TROPONIN I (HIGH SENSITIVITY)
Troponin I (High Sensitivity): 25 ng/L — ABNORMAL HIGH (ref <18)
Troponin I (High Sensitivity): 29 ng/L — ABNORMAL HIGH (ref <18)

## 2020-02-12 IMAGING — DX DG CHEST 1V PORT
1 series · 1 of 1 positions shown · non-contrast
Comparison: [DATE]

CLINICAL DATA: Chest pain

EXAM:
PORTABLE CHEST 1 VIEW

[chest ap]
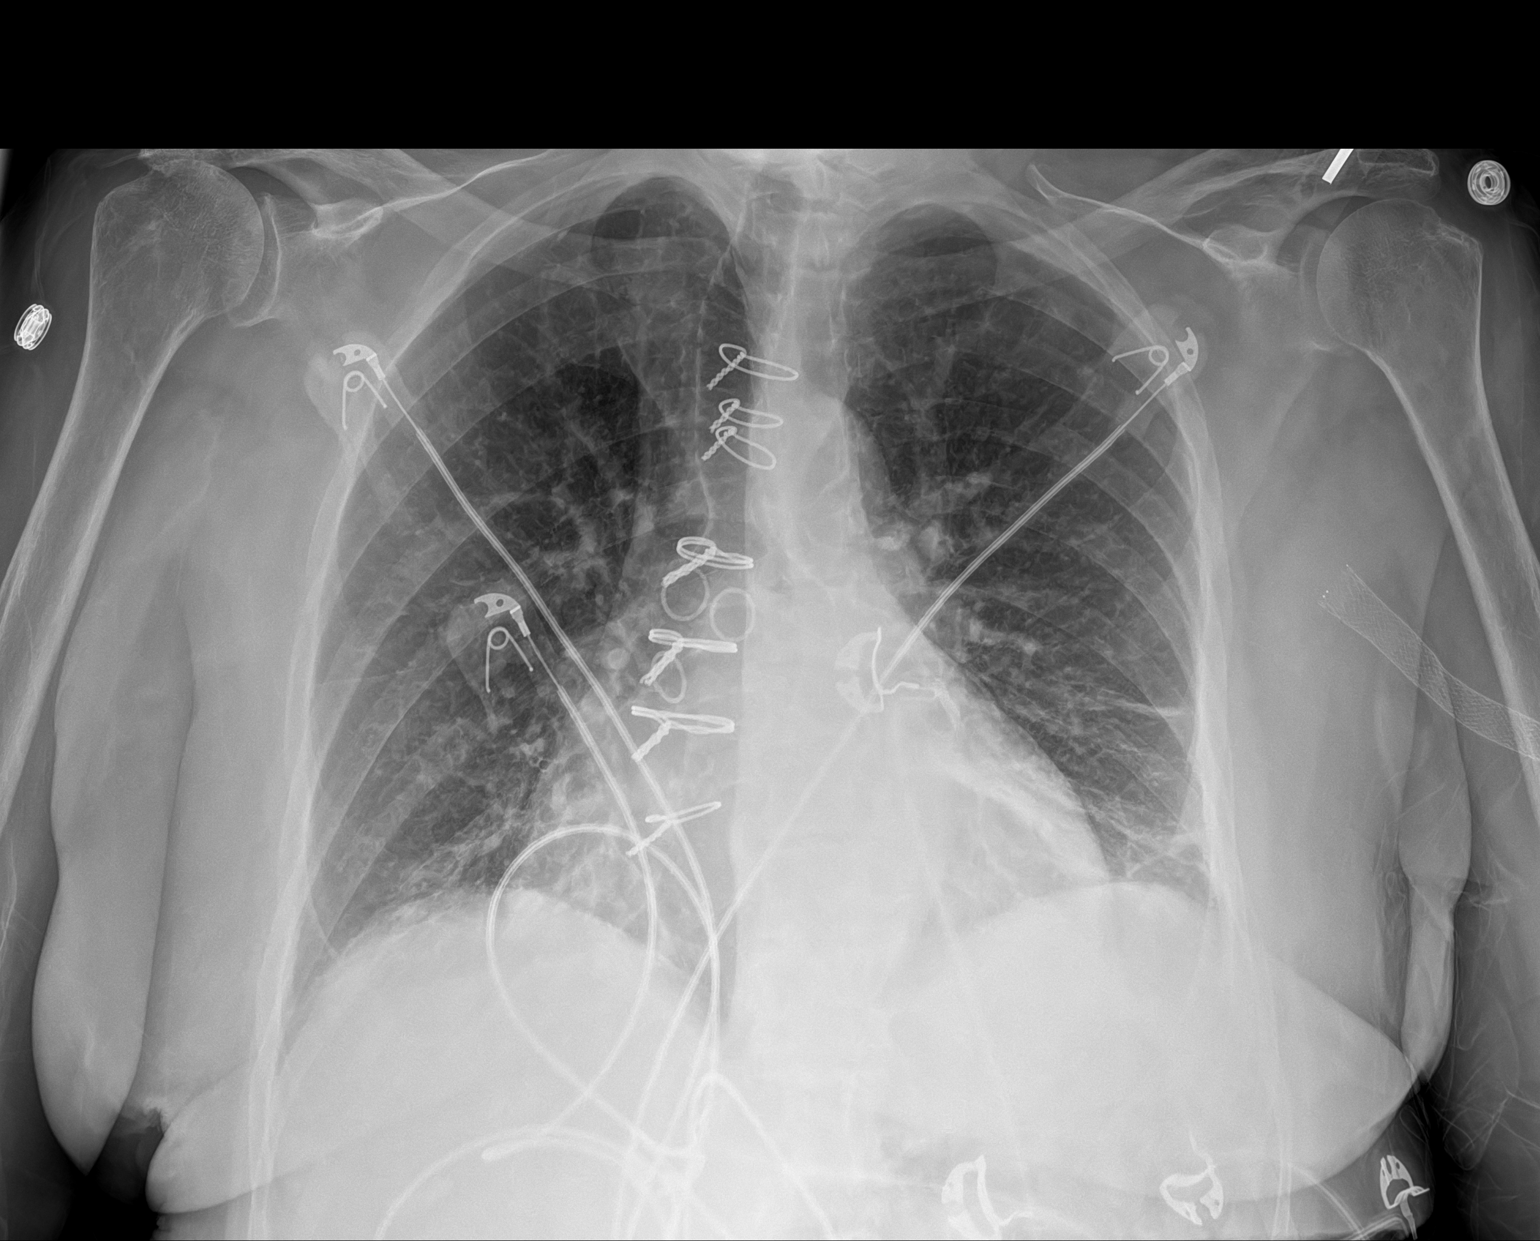

[1 of 1 positions shown; findings below may reference images not displayed]

FINDINGS: Cardiac shadow is enlarged. Postsurgical changes are noted.
Platelike atelectatic changes are noted in the bases stable from the
prior exam. No sizable effusion is noted. No bony abnormality is
seen. Prior stenting in the left arm is noted.
IMPRESSION: Mild bibasilar atelectasis.

## 2020-02-12 MED ORDER — NITROGLYCERIN 0.4 MG SL SUBL
0.4000 mg | SUBLINGUAL_TABLET | SUBLINGUAL | Status: DC | PRN
  Administered 2020-02-12: 0.4 mg via SUBLINGUAL
  Filled 2020-02-12: qty 1

## 2020-02-12 MED ORDER — METOPROLOL TARTRATE 5 MG/5ML IV SOLN
5.0000 mg | Freq: Once | INTRAVENOUS | Status: DC
  Filled 2020-02-12: qty 5

## 2020-02-12 MED ORDER — SODIUM CHLORIDE 0.9% FLUSH: 3.0000 mL | Freq: Once | INTRAVENOUS | Status: DC

## 2020-02-12 NOTE — ED Provider Notes (Signed)
[facility] EMERGENCY DEPARTMENT Provider Note   CSN: [id number] Arrival date & time: 02/12/20  1512     History Chief Complaint  Patient presents with  . Chest Pain    Shawna [name] is a 79 y.o. female with a history of ESRD on dialysis MWF, CAD S/p CABG, hypercholesterolemia, GERD, anemia, prior GI bleed, paroxysmal afib on amiodarone not anticoagulated, & prior cardiac arrest who presents to the ED via EMS with complaints of chest pain that began around 1400 today. Patient states she was at rest receiving dialysis (estimates she has about 22 minutes left) when she developed squeezing pain to the left chest that is non-radiating. Waxing/waning since onset. Alleviated some with NTG x2 prior to arrival, also received 324 mg of Aspirin. She has associated sensation of "just not feeling right" in her chest. She denies diaphoresis, dizziness, syncope, diaphoresis, nausea, vomiting, hemoptysis, recent travel/surgery, prior VTE, or leg pain/swelling.   HPI     Past Medical History:  Diagnosis Date  . Acute on chronic respiratory failure with hypoxia ([place]) 10/10/2016  . Anxiety   . Arthritis   . AVM (arteriovenous malformation) of colon   . CAD (coronary artery disease)    a. s/p CABG in 2013 b. DES to D1 in 10/2016. c. cath in 07/2018 showing patent grafts with occlusion of D1 at prior stent site and progression of PDA disease --> medical management recommended  . Carotid artery disease ([place])    a. 41-66% LICA, 0/6301   . Chronic anemia   . Chronic bronchitis ([place])   . Chronic diastolic CHF (congestive heart failure) ([place])    a. 02/2012 Echo EF 60-65%, nl wall motion, Gr 1 DD, mod MR  . Colon cancer ([place]) 1992  . Esophageal stricture   . ESRD on hemodialysis ([place])    ESRD due to HTN, started dialysis 2011 and gets HD at [facility] with Dr Hinda Lenis on MWF schedule.  Access is LUA AVF as of Sept 2014.   Marland Kitchen GERD (gastroesophageal reflux disease)   . High cholesterol 12/2011  . History of  blood transfusion 07/2011; 12/2011; 01/2012 X 2; 04/2012  . History of gout   . History of lower GI bleeding   . Hypertension   . Iron deficiency anemia   . Mitral regurgitation    a. Moderate by echo, 02/2012  . Myocardial infarction ([name])   . NSVT (nonsustained ventricular tachycardia) ([place])   . Ovarian cancer ([place]) 1992  . PAF (paroxysmal atrial fibrillation) ([place])   . Pneumonia ~ 2009  . PUD (peptic ulcer disease)   . TIA (transient ischemic attack)     Patient Active Problem List   Diagnosis Date Noted  . Pulmonary edema 10/14/2019  . Adenomatous duodenal polyp 09/10/2019  . History of GI bleed 09/10/2019  . Angina pectoris ([place]) 06/05/2019  . Chest pain 06/03/2019  . Rectal bleeding 05/14/2019  . Small intestinal bacterial overgrowth 05/14/2019  . Iron deficiency anemia 04/02/2019  . GI bleed 03/08/2019  . Gastrointestinal hemorrhage with melena 03/06/2019  . Acute respiratory failure with hypoxia ([place]) 12/25/2018  . Elevated troponin 12/14/2018  . Chest pain at rest 07/13/2018  . Hand steal syndrome ([place]) 08/01/2017  . Anemia 07/14/2017  . Coronary artery disease of bypass graft of native heart with stable angina pectoris ([place]) 06/05/2017  . Mesenteric ischemia ([place])   . Diverticulitis   . SBO (small bowel obstruction) ([place]) 03/22/2017  . Enteritis   . Complication of vascular access for dialysis  03/19/2017  . Preoperative clearance 01/25/2017  . Symptomatic anemia 10/24/2016  . H/O non-ST elevation myocardial infarction (NSTEMI) 10/24/2016  . Fluid overload 10/10/2016  . Complication from renal dialysis device 10/10/2016  . Non-ST elevation MI (NSTEMI) ([place])   . Encounter for fitting and adjustment of vascular catheter   . Heme positive stool   . Demand ischemia ([place]) 07/27/2016  . Hypertensive emergency 07/08/2016  . Acute on chronic respiratory failure with hypoxia ([place])   . Cardiac arrest ([place])   . Palliative care encounter   . Goals of care,  counseling/discussion   . Hypertensive crisis without congestive heart failure 05/09/2016  . Acute pulmonary edema ([place]) 04/06/2016  . Acute respiratory failure ([place]) 04/06/2016  . Hypertensive crisis 01/27/2016  . History of colon cancer 01/27/2016  . History of ovarian cancer 01/27/2016  . Hypertensive urgency 01/27/2016  . AF (paroxysmal atrial fibrillation) ([place]) 10/14/2015  . Coronary angioplasty status 10/14/2015  . Malignant neoplasm of right ovary ([place]) 10/14/2015  . Narrow complex tachycardia ([place]) 09/08/2015  . SVT (supraventricular tachycardia) ([place]) 09/08/2015  . Influenza A 08/30/2015  . Acute on chronic diastolic CHF (congestive heart failure) ([place]) 05/04/2015  . Unstable angina ([place]) 05/03/2015  . Essential hypertension   . Pain in joint, lower leg 08/14/2014  . Dacryocystitis 05/29/2013  . Chronic diastolic CHF (congestive heart failure) ([place]) 03/22/2013  . GI bleeding 03/21/2013  . Acute blood loss anemia 03/21/2013  . Vaginal odor 03/12/2013  . Vaginal discharge 03/12/2013  . Occlusion and stenosis of carotid artery without mention of cerebral infarction 01/24/2013  . Hx of CABG 07/05/2012  . Carotid artery disease ([place]) 07/05/2012  . Mitral regurgitation 06/12/2012  . Pneumonia 06/09/2012  . Non-STEMI (non-ST elevated myocardial infarction) ([place]) 06/08/2012  . Ischemic chest pain ([place]) 03/01/2012  . 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 GERD (gastroesophageal reflux disease) 01/09/2012  . HLD (hyperlipidemia) 01/05/2012  . Atherosclerotic heart disease of native coronary artery without angina pectoris 12/16/2011  . ESRD on hemodialysis ([place]) 12/16/2011  . Anxiety disorder 05/04/2011  . Anemia in chronic kidney disease 04/29/2011  . Secondary hyperparathyroidism of renal origin ([place]) 04/29/2011  . End stage renal disease ([place]) 04/29/2011  . Gout 04/29/2011  . Hypertensive chronic kidney disease with stage 5 chronic kidney  disease or end stage renal disease ([place]) 04/29/2011    Past Surgical History:  Procedure Laterality Date  . A/V SHUNTOGRAM Left 03/19/2019   Procedure: A/V SHUNTOGRAM;  Surgeon: Katha Cabal, MD;  Location: [place] CV LAB;  Service: Cardiovascular;  Laterality: Left;  . ABDOMINAL HYSTERECTOMY  1992  . APPENDECTOMY  06/1990  . AV FISTULA PLACEMENT  07/2009   left upper arm  . AV FISTULA PLACEMENT Right 09/06/2016   Procedure: RIGHT FOREARM ARTERIOVENOUS (AV) GRAFT;  Surgeon: Elam Dutch, MD;  Location: [facility] OR;  Service: Vascular;  Laterality: Right;  . AV FISTULA PLACEMENT N/A 02/24/2017   Procedure: INSERTION OF ARTERIOVENOUS (AV) GORE-TEX GRAFT ARM (BRACHIAL AXILLARY);  Surgeon: Katha Cabal, MD;  Location: [facility] ORS;  Service: Vascular;  Laterality: N/A;  . [place] Right 09/06/2016   Procedure: REMOVAL OF Right Arm ARTERIOVENOUS GORETEX GRAFT and Vein Patch angioplasty of brachial artery;  Surgeon: Angelia Mould, MD;  Location: [place];  Service: Vascular;  Laterality: Right;  . BIOPSY  09/26/2019   Procedure: BIOPSY;  Surgeon: Rogene Houston, MD;  [facility];  Service: Endoscopy;;  . COLON RESECTION  1992  .  COLON SURGERY    . COLONOSCOPY N/A 03/09/2019   Procedure: COLONOSCOPY;  Surgeon: Rogene Houston, MD;  [facility];  Service: Endoscopy;  Laterality: N/A;  . CORONARY ANGIOPLASTY WITH STENT PLACEMENT  12/15/11   "2"  . CORONARY ANGIOPLASTY WITH STENT PLACEMENT  y/2013   "1; makes total of 3" (05/02/2012)  . CORONARY ARTERY BYPASS GRAFT  06/13/2012   Procedure: CORONARY ARTERY BYPASS GRAFTING (CABG);  Surgeon: Grace Isaac, MD;  Location: [place];  Service: Open Heart Surgery;  Laterality: N/A;  cabg x four;  using left internal mammary artery, and left leg greater saphenous vein harvested endoscopically  . CORONARY STENT INTERVENTION N/A 10/13/2016   Procedure: Coronary Stent Intervention;  Surgeon: Troy Sine, MD;  Location:  [place] CV LAB;  Service: Cardiovascular;  Laterality: N/A;  . DIALYSIS/PERMA CATHETER REMOVAL N/A 04/18/2017   Procedure: DIALYSIS/PERMA CATHETER REMOVAL;  Surgeon: Katha Cabal, MD;  Location: [place] CV LAB;  Service: Cardiovascular;  Laterality: N/A;  . DILATION AND CURETTAGE OF UTERUS    . ESOPHAGOGASTRODUODENOSCOPY  01/20/2012   Procedure: ESOPHAGOGASTRODUODENOSCOPY (EGD);  Surgeon: Ladene Artist, MD,FACG;  Location: [facility] ENDOSCOPY;  Service: Endoscopy;  Laterality: N/A;  . ESOPHAGOGASTRODUODENOSCOPY N/A 03/26/2013   Procedure: ESOPHAGOGASTRODUODENOSCOPY (EGD);  Surgeon: Irene Shipper, MD;  Location: [facility] ENDOSCOPY;  Service: Endoscopy;  Laterality: N/A;  . ESOPHAGOGASTRODUODENOSCOPY N/A 04/30/2015   Procedure: ESOPHAGOGASTRODUODENOSCOPY (EGD);  Surgeon: Rogene Houston, MD;  [facility];  Service: Endoscopy;  Laterality: N/A;  1pm - moved to 10/20 @ 1:10  . ESOPHAGOGASTRODUODENOSCOPY N/A 07/29/2016   Procedure: ESOPHAGOGASTRODUODENOSCOPY (EGD);  Surgeon: Manus Gunning, MD;  Location: [place];  Service: Gastroenterology;  Laterality: N/A;  enteroscopy  . ESOPHAGOGASTRODUODENOSCOPY N/A 09/26/2019   Procedure: ESOPHAGOGASTRODUODENOSCOPY (EGD);  Surgeon: Rogene Houston, MD;  [facility];  Service: Endoscopy;  Laterality: N/A;  1250  . GIVENS CAPSULE STUDY N/A 03/07/2019   Procedure: GIVENS CAPSULE STUDY;  Surgeon: Rogene Houston, MD;  [facility];  Service: Endoscopy;  Laterality: N/A;  7:30  . INTRAOPERATIVE TRANSESOPHAGEAL ECHOCARDIOGRAM  06/13/2012   Procedure: INTRAOPERATIVE TRANSESOPHAGEAL ECHOCARDIOGRAM;  Surgeon: Grace Isaac, MD;  Location: [place];  Service: Open Heart Surgery;  Laterality: N/A;  . IR GENERIC HISTORICAL  07/26/2016   IR FLUORO GUIDE CV LINE RIGHT 07/26/2016 Greggory Keen, MD [facility]  . IR GENERIC HISTORICAL  07/26/2016   IR US GUIDE VASC ACCESS RIGHT 07/26/2016 Greggory Keen, MD [facility]  . IR  GENERIC HISTORICAL  08/02/2016   IR US GUIDE VASC ACCESS RIGHT 08/02/2016 Greggory Keen, MD [facility]  . IR GENERIC HISTORICAL  08/02/2016   IR FLUORO GUIDE CV LINE RIGHT 08/02/2016 Greggory Keen, MD [facility]  . IR RADIOLOGY PERIPHERAL GUIDED IV START  03/28/2017  . IR US GUIDE VASC ACCESS RIGHT  03/28/2017  . LEFT HEART CATH AND CORONARY ANGIOGRAPHY N/A 09/20/2016   Procedure: Left Heart Cath and Coronary Angiography;  Surgeon: [name], MD;  Location: [place] CV LAB;  Service: Cardiovascular;  Laterality: N/A;  . LEFT HEART CATH AND CORS/GRAFTS ANGIOGRAPHY N/A 10/13/2016   Procedure: Left Heart Cath and Cors/Grafts Angiography;  Surgeon: Troy Sine, MD;  Location: [place] CV LAB;  Service: Cardiovascular;  Laterality: N/A;  . LEFT HEART CATH AND CORS/GRAFTS ANGIOGRAPHY N/A 07/13/2018   Procedure: LEFT HEART CATH AND CORS/GRAFTS ANGIOGRAPHY;  Surgeon: Martinique, Peter M, MD;  Location: [place] CV LAB;  Service: Cardiovascular;  Laterality: N/A;  . LEFT HEART CATHETERIZATION WITH CORONARY ANGIOGRAM N/A 12/15/2011   Procedure: LEFT HEART CATHETERIZATION WITH CORONARY ANGIOGRAM;  Surgeon: Burnell Blanks, MD;  Location: [facility] CATH LAB;  Service: Cardiovascular;  Laterality: N/A;  . LEFT HEART CATHETERIZATION WITH CORONARY ANGIOGRAM N/A 01/10/2012   Procedure: LEFT HEART CATHETERIZATION WITH CORONARY ANGIOGRAM;  Surgeon: Peter M Martinique, MD;  Location: [facility] CATH LAB;  Service: Cardiovascular;  Laterality: N/A;  . LEFT HEART CATHETERIZATION WITH CORONARY ANGIOGRAM N/A 06/08/2012   Procedure: LEFT HEART CATHETERIZATION WITH CORONARY ANGIOGRAM;  Surgeon: Burnell Blanks, MD;  Location: [facility] CATH LAB;  Service: Cardiovascular;  Laterality: N/A;  . LEFT HEART CATHETERIZATION WITH CORONARY/GRAFT ANGIOGRAM N/A 12/10/2013   Procedure: LEFT HEART CATHETERIZATION WITH Beatrix Fetters;  Surgeon: [name], MD;  Location: [facility] CATH LAB;  Service: Cardiovascular;  Laterality: N/A;   . OVARY SURGERY     ovarian cancer  . POLYPECTOMY  03/09/2019   Procedure: POLYPECTOMY;  Surgeon: Rogene Houston, MD;  [facility];  Service: Endoscopy;;  cecal   . POLYPECTOMY N/A 09/26/2019   Procedure: DUODENAL POLYPECTOMY;  Surgeon: Rogene Houston, MD;  [facility];  Service: Endoscopy;  Laterality: N/A;  . REVISION OF ARTERIOVENOUS GORETEX GRAFT N/A 02/24/2017   Procedure: REVISION OF ARTERIOVENOUS GORETEX GRAFT (RESECTION);  Surgeon: Katha Cabal, MD;  Location: [facility] ORS;  Service: Vascular;  Laterality: N/A;  Earney Mallet N/A 10/15/2013   Procedure: Fistulogram;  Surgeon: Serafina Mitchell, MD;  Location: [facility] CATH LAB;  Service: Cardiovascular;  Laterality: N/A;  . THROMBECTOMY / ARTERIOVENOUS GRAFT REVISION  2011   left upper arm  . TUBAL LIGATION  1980's  . UPPER EXTREMITY ANGIOGRAPHY Bilateral 12/06/2016   Procedure: Upper Extremity Angiography;  Surgeon: Katha Cabal, MD;  Location: [place] CV LAB;  Service: Cardiovascular;  Laterality: Bilateral;  . UPPER EXTREMITY INTERVENTION Left 06/06/2017   Procedure: UPPER EXTREMITY INTERVENTION;  Surgeon: Katha Cabal, MD;  Location: [place] CV LAB;  Service: Cardiovascular;  Laterality: Left;     OB History    Gravida  2   Para  2   Term      Preterm  2   AB      Living  2     SAB      TAB      Ectopic      Multiple      Live Births              Family History  Problem Relation Age of Onset  . Heart disease Mother        Heart Disease before age 104  . Hyperlipidemia Mother   . Hypertension Mother   . Diabetes Mother   . Heart attack Mother   . Heart disease Father        Heart Disease before age 26  . Hyperlipidemia Father   . Hypertension Father   . Diabetes Father   . Diabetes Sister   . Hypertension Sister   . Diabetes Brother   . Hyperlipidemia Brother   . Heart attack Brother   . Hypertension Sister   . Heart attack Brother   . Colon cancer  Child 28  . Other Other        noncontributory for early CAD  . Esophageal cancer Neg Hx   . Liver disease Neg Hx   . Kidney disease Neg Hx   . Colon polyps Neg  Hx     Social History   Tobacco Use  . Smoking status: Never Smoker  . Smokeless tobacco: Never Used  Vaping Use  . Vaping Use: Never used  Substance Use Topics  . Alcohol use: No    Alcohol/week: 0.0 standard drinks  . Drug use: No    Home Medications Prior to Admission medications   Medication Sig Start Date End Date Taking? Authorizing Provider  ALPRAZolam Duanne Moron) 0.5 MG tablet Take 0.5 mg by mouth at bedtime as needed for sleep.  10/16/18   [provider]  amiodarone (PACERONE) 200 MG tablet Take 1 tablet (200 mg total) by mouth daily. Starting July 2 12/17/19   Ahmed Prima Fransisco Hertz, PA-C  aspirin EC 81 MG tablet Take 1 tablet (81 mg total) by mouth daily. Restart on 03/13/19 09/27/19   Rogene Houston, MD  diphenhydrAMINE (BENADRYL ALLERGY) 25 MG tablet Take 1 tablet (25 mg total) by mouth once for 1 dose. Take 1 tablet 1 hour prior to scheduled CT 02/09/20 02/09/20  Franchot Gallo, MD  doxycycline (DORYX) 100 MG EC tablet Take 100 mg by mouth 2 (two) times daily.    [provider]  fluticasone (FLONASE) 50 MCG/ACT nasal spray Place 1 spray at bedtime as needed into both nostrils for allergies.     [provider]  gabapentin (NEURONTIN) 100 MG capsule Take 100 mg by mouth 2 (two) times daily.  06/10/19   [provider]  isosorbide mononitrate (IMDUR) 60 MG 24 hr tablet Take 1 tablet (60 mg total) by mouth 2 (two) times daily. 10/22/19 01/20/20  Herminio Commons, MD  lidocaine-prilocaine (EMLA) cream Apply 1 application topically every Monday, Wednesday, and Friday. Prior to dialysis    [provider]  loratadine (CLARITIN) 10 MG tablet Take 1 tablet (10 mg total) by mouth daily as needed for allergies. 07/14/18   Hosie Poisson, MD  multivitamin (RENA-VIT) TABS tablet Take 1  tablet by mouth daily.    [provider]  nitroGLYCERIN (NITROSTAT) 0.4 MG SL tablet DISSOLVE ONE TABLET UNDER THE TONGUE EVERY 5 MINUTES AS NEEDED FOR CHEST PAIN.&nbsp;&nbsp;DO NOT EXCEED A TOTAL OF 3 DOSES IN 15 MINUTES 11/22/19   Emokpae, Courage, MD  omeprazole (PRILOSEC) 40 MG capsule Take 1 capsule (40 mg total) by mouth daily before breakfast. 09/10/19   Rehman, Mechele Dawley, MD  ondansetron (ZOFRAN-ODT) 4 MG disintegrating tablet Take 4 mg by mouth every 8 (eight) hours as needed for nausea or vomiting.  03/05/19   [provider]  predniSONE (DELTASONE) 50 MG tablet To be used for CT contrast allergy . Take 1 tablet by mouth 13 hours before scheduled CT Take 1 tablet by mouth 7 hours before scheduled CT Take 1 tablet by mouth 1 hour before scheduled CT Patient not taking: Reported on 12/17/2019 02/09/20   Franchot Gallo, MD  sevelamer carbonate (RENVELA) 800 MG tablet Take 1,600-2,400 mg by mouth 3 (three) times daily with meals. Takes 3 tablets with meals and 2 tablets with snacks 04/03/19   [provider]  simvastatin (ZOCOR) 20 MG tablet TAKE 1 TABLET BY MOUTH ONCE DAILY AT BEDTIME Patient taking differently: Take 20 mg by mouth at bedtime.  07/22/19   Herminio Commons, MD  triamcinolone cream (KENALOG) 0.1 % Apply 1 application topically daily as needed (Dry skin/ itching).  09/11/19   [provider]    Allergies    Aspirin, Penicillins, Amlodipine, Bactrim [sulfamethoxazole-trimethoprim], Contrast media [iodinated diagnostic agents], Iron, Nitrofurantoin, Tylenol [  acetaminophen], Gabapentin, Hydralazine, Dexilant [dexlansoprazole], Levaquin [levofloxacin in d5w], Morphine and related, Plavix [clopidogrel bisulfate], Protonix [pantoprazole sodium], and Venofer [ferric oxide]  Review of Systems   Review of Systems  Constitutional: Negative for chills, diaphoresis and fever.  Respiratory: Negative for shortness of breath.   Cardiovascular: Positive for  chest pain. Negative for leg swelling.  Gastrointestinal: Negative for abdominal pain, nausea and vomiting.  Neurological: Negative for dizziness, syncope, weakness and numbness.  All other systems reviewed and are negative.   Physical Exam Updated Vital Signs BP (!) 156/84 (BP Location: Left Arm)   Pulse (!) 125   Temp 99.3 F (37.4 C) (Oral)   Resp 18   Ht 5\' 2"  (1.575 m)   Wt 60.3 kg   SpO2 98%   BMI 24.33 kg/m   Physical Exam Vitals and nursing note reviewed.  Constitutional:      General: She is not in acute distress.    Appearance: She is well-developed. She is not toxic-appearing.  HENT:     Head: Normocephalic and atraumatic.  Eyes:     General:        Right eye: No discharge.        Left eye: No discharge.     Conjunctiva/sclera: Conjunctivae normal.  Cardiovascular:     Rate and Rhythm: Tachycardia present.     Pulses:          Radial pulses are 2+ on the right side and 2+ on the left side.  Pulmonary:     Effort: Pulmonary effort is normal. No respiratory distress.     Breath sounds: Normal breath sounds. No wheezing, rhonchi or rales.  Abdominal:     General: There is no distension.     Palpations: Abdomen is soft.     Tenderness: There is no abdominal tenderness.  Musculoskeletal:     Cervical back: Neck supple.     Right lower leg: No tenderness. No edema.     Left lower leg: No tenderness. No edema.     Comments: LUE with AV fistula with palpable thrill.   Skin:    General: Skin is warm and dry.     Findings: No rash.  Neurological:     Mental Status: She is alert.     Comments: Clear speech.   Psychiatric:        Behavior: Behavior normal.     ED Results / Procedures / Treatments   Labs (all labs ordered are listed, but only abnormal results are displayed) Labs Reviewed  BASIC METABOLIC PANEL - Abnormal; Notable for the following components:      Result Value   Creatinine, Ser 4.84 (*)    Calcium 7.6 (*)    GFR calc non Af Amer 8 (*)      GFR calc Af Amer 9 (*)    All other components within normal limits  CBC - Abnormal; Notable for the following components:   RBC 2.63 (*)    Hemoglobin 9.3 (*)    HCT 31.4 (*)    MCV 119.4 (*)    MCH 35.4 (*)    MCHC 29.6 (*)    RDW 19.0 (*)    All other components within normal limits  TROPONIN I (HIGH SENSITIVITY) - Abnormal; Notable for the following components:   Troponin I (High Sensitivity) 25 (*)    All other components within normal limits  TROPONIN I (HIGH SENSITIVITY) - Abnormal; Notable for the following components:   Troponin I (High Sensitivity) 29 (*)  All other components within normal limits    EKG EKG Interpretation  Date/Time:  Wednesday February 12 2020 15:57:12 EDT Ventricular Rate:  124 PR Interval:    QRS Duration: 118 QT Interval:  365 QTC Calculation: 525 R Axis:   25 Text Interpretation: Supraventricular tachycardia LVH with secondary repolarization abnormality Anterior infarct, old Prolonged QT interval When compared with ECG of 12/13/2019, Supraventricular tachycardia has replaced Sinus rhythm QT has lengthened Confirmed by Delora Fuel ([id number]) on 02/12/2020 4:02:32 PM  EKG Interpretation  Date/Time:  Wednesday February 12 2020 19:36:25 EDT Ventricular Rate:  71 PR Interval:    QRS Duration: 116 QT Interval:  439 QTC Calculation: 478 R Axis:   36 Text Interpretation: Sinus rhythm Probable left atrial enlargement LVH with secondary repolarization abnormality Anterior infarct, old When compared with ECG of EARLIER SAME DATE Sinus rhythm has replaced Supraventricular tachycardia QT has shortened Confirmed by Delora Fuel ([id number]) on 02/12/2020 7:39:05 PM   Radiology DG Chest Port 1 View  Result Date: 02/12/2020 CLINICAL DATA:  Chest pain EXAM: PORTABLE CHEST 1 VIEW COMPARISON:  11/22/2019 FINDINGS: Cardiac shadow is enlarged. Postsurgical changes are noted. Platelike atelectatic changes are noted in the bases stable from the prior exam. No sizable  effusion is noted. No bony abnormality is seen. Prior stenting in the left arm is noted. IMPRESSION: Mild bibasilar atelectasis. Electronically Signed   By: Inez Catalina M.D.   On: 02/12/2020 16:35    Procedures Procedures (including critical care time)  Medications Ordered in ED Medications  sodium chloride flush (NS) 0.9 % injection 3 mL (has no administration in time range)    ED Course  I have reviewed the triage vital signs and the nursing notes.  Pertinent labs & imaging results that were available during my care of the patient were reviewed by me and considered in my medical decision making (see chart for details).    MDM Rules/Calculators/A&P                         Patient presents to the ED with complaints of chest pain. Nontoxic, vitals with tachycardia and mild hypertension. Exam is fairly benign. No signs of volume overload.    Additional history obtained:  Additional history obtained from nursing notes & chart review:  Seen 11/22/19 with similar presentation, hospitalist & cardiology were consulted: Per cardiology consult note- ECG unchanged @ that time, troponins felt to be nonspecifically elevated in the context of CKD & subsequent anemia, history of chronic chest pains, felt she could follow up as an outpatient at that time, also referenced his progress note from 10/2019 which has been reviewed as well: CAD (s/p CABG in 2013,DES to D1 in 10/2016 with cath showing patent LIMA-LAD, SVG-RI, SVG-Mrg and SVG-RCA, cath in 07/2018 showingpatentgrafts with occlusion of D1 at prior stent site and progression of PDA disease --> medical management recommended), HTN, HLD, PAF (not on anticoagulation given history of GIB),NSVT (on Amiodarone)and ESRD--> episodic chest pains noted at that time, recommendation for continued medical therapy.   Seen most recently in the ED 12/13/19 for similar, found to be in suspect afib with RVR, given extra dose of her amiodarone and IV lopressor  with improvement. Cardiology was consulted @ that time and had patient increase her amiodarone to BID from daily.   EKG today: Supraventricular tachycardia LVH with secondary repolarization abnormality Anterior infarct, old Prolonged QT interval When compared with ECG of 12/13/2019, Supraventricular tachycardia has replaced Sinus rhythm QT has  lengthened  --> will trial Lopressor as this seemed to help at prior visit & re-assess, may require adenosine if SVT.   Lab Tests:  I Ordered, reviewed, and interpreted labs, which included:  CBC: Anemia @ baseline.  BMP: hypocalcemia no other notable electrolyte derangement. Renal function consistent w/ ESRD.  Troponins: 25,29  Imaging Studies ordered:  I ordered imaging studies which included CXR, I independently visualized and interpreted imaging, in agreement with radiologist impression- Mild bibasilar atelectasis.   Patient refused IV access therefore IV Lopressor was not given.  She had improvement in her chest pain following a third dose of nitroglycerin provided in the ER.  Her heart rate spontaneously improved to the 70s w/ resolution of pain, appears to be in normal sinus rhythm, and on reassessment @ 1900 she is chest pain free and remains so on additional re-assessment @ 20:21 & is requesting to go home.  Unclear if her initial rhythm was A. fib with RVR versus SVT, she does have a history of A. fib, she is not anticoagulated secondary to her history of GI bleeds.  She is not hypoxic, she is a history of very similar pain in the past, I have a low suspicion for pulmonary embolism.  She has symmetric pulses, no widened mediastinum on her chest x-ray, low suspicion for dissection.  Her labs appear fairly baseline for her.  Her chest x-ray is without pneumonia, CHF, or pneumothorax.  She has had prior similar visits. She remains chest pain-free and in sinus rhythm, we will discuss with cardiology.   20:34: CONSULT: Discussed case with Dr. Gasper Sells  with cardiology who has reviewed patient's information- in agreement with discharge home at this time, he will message Dr. Bronson Ing to make aware of visit today and to help facilitate with follow up. Appreciate consultation.   I discussed results, treatment plan, need for follow-up, and return precautions with the patient & her husband. Provided opportunity for questions, patient & her husband confirmed understanding and are in agreement with plan.   Findings and plan of care discussed with supervising physician Dr. Roxanne Mins who has evaluated patient as shared visit & is in agreement.   Portions of this note were generated with [employer]. Dictation errors may occur despite best attempts at proofreading.  Final Clinical Impression(s) / ED Diagnoses Final diagnoses:  Chest pain, unspecified type  Tachycardia    Rx / DC Orders ED Discharge Orders    None       Leafy Kindle 44/81/85 [id number]    Delora Fuel, MD 97/02/63 0000

## 2020-02-12 NOTE — ED Triage Notes (Signed)
EMS reports pt was receiving dialysis and had approx 60min left and started having chest pain.  Pt took 1 nitro of her own around 2pm and it helped her pain.  Pt still having left sided chest pain, rates at 8.  Pt also took 4 baby asa and ems gave a second nitro.  Reports hr fluctuating around 120's-130's.

## 2020-02-12 NOTE — Discharge Instructions (Addendum)
You were seen in the emergency department today for chest pain. Your work-up in the emergency department has been overall reassuring. Your labs have been fairly normal and or similar to previous blood work you have had done, your calcium was somewhat lower than normal, please have this rechecked by your primary care provider.. Your EKG and the enzyme we use to check your heart did not show definitive signs of a heart attack at this time.  Your chest x-ray did not show any significant abnormalities.    When you first arrived in the ER your heart beat was abnormally fast, this resolved while you were here.  We suspect this may have contributed to your chest discomfort.  We would like you to follow up closely with your primary care provider and your cardiologist within 1 to 3 days.  Your blood pressure was somewhat elevated today- please be sure to have this rechecked at your follow up appointment. One of our Cardiology team members on call this evening is planning to message Dr. Bronson Ing to make him aware of your visit today and to help facilitate follow up.  Return to the ER immediately should you experience any new or worsening symptoms including but not limited to return of pain, worsened pain, vomiting, shortness of breath, dizziness, lightheadedness, passing out, or any other concerns that you may have.

## 2020-02-13 NOTE — Progress Notes (Signed)
Cardiology Office Note  Date: 02/14/2020   ID: Shawna [name], Shawna [name] 10/08/1940, MRN [id number]  PCP:  Shawna Percy, MD  Cardiologist:  Shawna Sable, MD (Inactive) Electrophysiologist:  None   Chief Complaint: Follow-up CAD  History of Present Illness: Shawna [name] is a 79 y.o. female with a history of CAD (status post CABG 2013, DES to D1 10/2017, with patent LIMA-LAD, SVG-RI, SVG-MRG and SVG-RCA).  Cath 07/30/2018 patent dressed with occlusion of the wound and prior stent site and progressive PDA disease, medical management recommended.  Other history includes chronic diastolic CHF, carotid artery disease, HTN, HLD, PAF (not on anticoagulation due to GI bleed) NSVT (on amiodarone) ESRD.  Present to [facility] ED 12/13/2019 with complaint of chest pain starting during dialysis.  Found to be in atrial flutter with RVR.  Given IV Lopressor along with amiodarone 200 mg and converted back to normal sinus rhythm.  Recommended to increase her amiodarone from 200 mg daily to 200 mg p.o. twice daily.  Last saw Shawna [name], [place].  She has denied any recent palpitations since the recent ED visit.  Has baseline chronic dyspnea on exertion.  Occasional episodes of CP with overexerting but always resolved with sublingual nitroglycerin.  Presented to the emergency room on 02/12/2020 with chest pain developing during dialysis.  She rated pain 10 out of 10.  She had partial relief with nitroglycerin sublingual.  Stated she had been undergoing dialysis for approximately 20+ minutes when she developed chest pain to the left chest, squeezing in quality, nonradiating, waxing and waning, alleviated with nitroglycerin x2 prior to arrival.  Also received 324 mg of aspirin.  Troponins were 25-29.  She was tachycardic and EKG interpreted as supraventricular tachycardia, LVH with secondary repolarization abnormality, anterior infarct, old, prolonged QT interval cardiology was consulted who reviewed the patient's information  and in agreement to discharge the patient home.  Patient had previous visits to the emergency room on 11/22/2019 with a similar presentation.  She had been seen most recently in the emergency room on 12/13/2019 for similar symptoms..  She was found to be in suspected atrial fibrillation with RVR.  She was given extra dose of her amiodarone and IV Lopressor with improvement.  Cardiology was consulted during that visit who increased her amiodarone to twice daily from daily dosage.  She is here today for follow-up status post recent emergency room visit.  She denies any chest pain, pressure, tightness, neck, arm, back, or jaw pain since visit to the emergency room.  Denies any exertional dyspnea.  She is not very active on a daily basis.  She is walking with a cane today.  Denies any palpitations or arrhythmias, orthostatic symptoms.  It appears she is back down to amiodarone 200 mg once a day which apparently started on July 2.  She continues with dialysis on Monday Wednesdays and Fridays.  She has dialysis later today.  Her blood pressure is up today at 164/90.  She states she does not tolerate lower blood pressures in the 120s.  She states once her blood pressure reaches the 120s she starts to feel faint and has sensation of presyncope.  She states this occurs on occasion during dialysis.  He states recently she has had some occasional cramping in arms and legs and some occasional involuntary movements described as a jerking motion at times.  Otherwise she has no complaints today.  Past Medical History:  Diagnosis Date  . Acute on chronic respiratory failure with hypoxia ([place]) 10/10/2016  .  Anxiety   . Arthritis   . AVM (arteriovenous malformation) of colon   . CAD (coronary artery disease)    a. s/p CABG in 2013 b. DES to D1 in 10/2016. c. cath in 07/2018 showing patent grafts with occlusion of D1 at prior stent site and progression of PDA disease --> medical management recommended  . Carotid artery  disease ([place])    a. 16-10% LICA, 03/6044   . Chronic anemia   . Chronic bronchitis ([place])   . Chronic diastolic CHF (congestive heart failure) ([place])    a. 02/2012 Echo EF 60-65%, nl wall motion, Gr 1 DD, mod MR  . Colon cancer ([place]) 1992  . Esophageal stricture   . ESRD on hemodialysis ([place])    ESRD due to HTN, started dialysis 2011 and gets HD at [facility] with Dr Hinda Lenis on MWF schedule.  Access is LUA AVF as of Sept 2014.   Marland Kitchen GERD (gastroesophageal reflux disease)   . High cholesterol 12/2011  . History of blood transfusion 07/2011; 12/2011; 01/2012 X 2; 04/2012  . History of gout   . History of lower GI bleeding   . Hypertension   . Iron deficiency anemia   . Mitral regurgitation    a. Moderate by echo, 02/2012  . Myocardial infarction ([place])   . NSVT (nonsustained ventricular tachycardia) ([place])   . Ovarian cancer ([place]) 1992  . PAF (paroxysmal atrial fibrillation) ([place])   . Pneumonia ~ 2009  . PUD (peptic ulcer disease)   . TIA (transient ischemic attack)     Past Surgical History:  Procedure Laterality Date  . A/V SHUNTOGRAM Left 03/19/2019   Procedure: A/V SHUNTOGRAM;  Surgeon: Katha Cabal, MD;  Location: [place] CV LAB;  Service: Cardiovascular;  Laterality: Left;  . ABDOMINAL HYSTERECTOMY  1992  . APPENDECTOMY  06/1990  . AV FISTULA PLACEMENT  07/2009   left upper arm  . AV FISTULA PLACEMENT Right 09/06/2016   Procedure: RIGHT FOREARM ARTERIOVENOUS (AV) GRAFT;  Surgeon: Elam Dutch, MD;  Location: [facility] OR;  Service: Vascular;  Laterality: Right;  . AV FISTULA PLACEMENT N/A 02/24/2017   Procedure: INSERTION OF ARTERIOVENOUS (AV) GORE-TEX GRAFT ARM (BRACHIAL AXILLARY);  Surgeon: Katha Cabal, MD;  Location: [facility] ORS;  Service: Vascular;  Laterality: N/A;  . [place] Right 09/06/2016   Procedure: REMOVAL OF Right Arm ARTERIOVENOUS GORETEX GRAFT and Vein Patch angioplasty of brachial artery;  Surgeon: Angelia Mould, MD;  Location: [place];  Service:  Vascular;  Laterality: Right;  . BIOPSY  09/26/2019   Procedure: BIOPSY;  Surgeon: Rogene Houston, MD;  [facility];  Service: Endoscopy;;  . COLON RESECTION  1992  . COLON SURGERY    . COLONOSCOPY N/A 03/09/2019   Procedure: COLONOSCOPY;  Surgeon: Rogene Houston, MD;  [facility];  Service: Endoscopy;  Laterality: N/A;  . CORONARY ANGIOPLASTY WITH STENT PLACEMENT  12/15/11   "2"  . CORONARY ANGIOPLASTY WITH STENT PLACEMENT  y/2013   "1; makes total of 3" (05/02/2012)  . CORONARY ARTERY BYPASS GRAFT  06/13/2012   Procedure: CORONARY ARTERY BYPASS GRAFTING (CABG);  Surgeon: Grace Isaac, MD;  Location: [place];  Service: Open Heart Surgery;  Laterality: N/A;  cabg x four;  using left internal mammary artery, and left leg greater saphenous vein harvested endoscopically  . CORONARY STENT INTERVENTION N/A 10/13/2016   Procedure: Coronary Stent Intervention;  Surgeon: Troy Sine, MD;  Location: [place] CV LAB;  Service: Cardiovascular;  Laterality: N/A;  . DIALYSIS/PERMA CATHETER REMOVAL N/A 04/18/2017   Procedure: DIALYSIS/PERMA CATHETER REMOVAL;  Surgeon: Katha Cabal, MD;  Location: [place] CV LAB;  Service: Cardiovascular;  Laterality: N/A;  . DILATION AND CURETTAGE OF UTERUS    . ESOPHAGOGASTRODUODENOSCOPY  01/20/2012   Procedure: ESOPHAGOGASTRODUODENOSCOPY (EGD);  Surgeon: Ladene Artist, MD,FACG;  Location: [facility] ENDOSCOPY;  Service: Endoscopy;  Laterality: N/A;  . ESOPHAGOGASTRODUODENOSCOPY N/A 03/26/2013   Procedure: ESOPHAGOGASTRODUODENOSCOPY (EGD);  Surgeon: Irene Shipper, MD;  Location: [facility] ENDOSCOPY;  Service: Endoscopy;  Laterality: N/A;  . ESOPHAGOGASTRODUODENOSCOPY N/A 04/30/2015   Procedure: ESOPHAGOGASTRODUODENOSCOPY (EGD);  Surgeon: Rogene Houston, MD;  [facility];  Service: Endoscopy;  Laterality: N/A;  1pm - moved to 10/20 @ 1:10  . ESOPHAGOGASTRODUODENOSCOPY N/A 07/29/2016   Procedure: ESOPHAGOGASTRODUODENOSCOPY (EGD);   Surgeon: Manus Gunning, MD;  Location: [place];  Service: Gastroenterology;  Laterality: N/A;  enteroscopy  . ESOPHAGOGASTRODUODENOSCOPY N/A 09/26/2019   Procedure: ESOPHAGOGASTRODUODENOSCOPY (EGD);  Surgeon: Rogene Houston, MD;  [facility];  Service: Endoscopy;  Laterality: N/A;  1250  . GIVENS CAPSULE STUDY N/A 03/07/2019   Procedure: GIVENS CAPSULE STUDY;  Surgeon: Rogene Houston, MD;  [facility];  Service: Endoscopy;  Laterality: N/A;  7:30  . INTRAOPERATIVE TRANSESOPHAGEAL ECHOCARDIOGRAM  06/13/2012   Procedure: INTRAOPERATIVE TRANSESOPHAGEAL ECHOCARDIOGRAM;  Surgeon: Grace Isaac, MD;  Location: [place];  Service: Open Heart Surgery;  Laterality: N/A;  . IR GENERIC HISTORICAL  07/26/2016   IR FLUORO GUIDE CV LINE RIGHT 07/26/2016 Greggory Keen, MD [facility]INTER[facility]  . IR GENERIC HISTORICAL  07/26/2016   IR US GUIDE VASC ACCESS RIGHT 07/26/2016 Greggory Keen, MD [facility]  . IR GENERIC HISTORICAL  08/02/2016   IR US GUIDE VASC ACCESS RIGHT 08/02/2016 Greggory Keen, MD [facility]  . IR GENERIC HISTORICAL  08/02/2016   IR FLUORO GUIDE CV LINE RIGHT 08/02/2016 Greggory Keen, MD [facility]  . IR RADIOLOGY PERIPHERAL GUIDED IV START  03/28/2017  . IR US GUIDE VASC ACCESS RIGHT  03/28/2017  . LEFT HEART CATH AND CORONARY ANGIOGRAPHY N/A 09/20/2016   Procedure: Left Heart Cath and Coronary Angiography;  Surgeon: [name], MD;  Location: [place] CV LAB;  Service: Cardiovascular;  Laterality: N/A;  . LEFT HEART CATH AND CORS/GRAFTS ANGIOGRAPHY N/A 10/13/2016   Procedure: Left Heart Cath and Cors/Grafts Angiography;  Surgeon: Troy Sine, MD;  Location: [place] CV LAB;  Service: Cardiovascular;  Laterality: N/A;  . LEFT HEART CATH AND CORS/GRAFTS ANGIOGRAPHY N/A 07/13/2018   Procedure: LEFT HEART CATH AND CORS/GRAFTS ANGIOGRAPHY;  Surgeon: Martinique, Peter M, MD;  Location: [place] CV LAB;  Service: Cardiovascular;  Laterality: N/A;  . LEFT HEART  CATHETERIZATION WITH CORONARY ANGIOGRAM N/A 12/15/2011   Procedure: LEFT HEART CATHETERIZATION WITH CORONARY ANGIOGRAM;  Surgeon: Burnell Blanks, MD;  Location: [facility] CATH LAB;  Service: Cardiovascular;  Laterality: N/A;  . LEFT HEART CATHETERIZATION WITH CORONARY ANGIOGRAM N/A 01/10/2012   Procedure: LEFT HEART CATHETERIZATION WITH CORONARY ANGIOGRAM;  Surgeon: Peter M Martinique, MD;  Location: [facility] CATH LAB;  Service: Cardiovascular;  Laterality: N/A;  . LEFT HEART CATHETERIZATION WITH CORONARY ANGIOGRAM N/A 06/08/2012   Procedure: LEFT HEART CATHETERIZATION WITH CORONARY ANGIOGRAM;  Surgeon: Burnell Blanks, MD;  Location: [facility] CATH LAB;  Service: Cardiovascular;  Laterality: N/A;  . LEFT HEART CATHETERIZATION WITH CORONARY/GRAFT ANGIOGRAM N/A 12/10/2013   Procedure: LEFT HEART CATHETERIZATION WITH CORONARY/GRAFT ANGIOGRAM;  Surgeon: Charlann Lange  Irish Lack, MD;  Location: [facility] CATH LAB;  Service: Cardiovascular;  Laterality: N/A;  . OVARY SURGERY     ovarian cancer  . POLYPECTOMY  03/09/2019   Procedure: POLYPECTOMY;  Surgeon: Rogene Houston, MD;  [facility];  Service: Endoscopy;;  cecal   . POLYPECTOMY N/A 09/26/2019   Procedure: DUODENAL POLYPECTOMY;  Surgeon: Rogene Houston, MD;  [facility];  Service: Endoscopy;  Laterality: N/A;  . REVISION OF ARTERIOVENOUS GORETEX GRAFT N/A 02/24/2017   Procedure: REVISION OF ARTERIOVENOUS GORETEX GRAFT (RESECTION);  Surgeon: Katha Cabal, MD;  Location: [facility] ORS;  Service: Vascular;  Laterality: N/A;  [place] N/A 10/15/2013   Procedure: Fistulogram;  Surgeon: Serafina Mitchell, MD;  Location: [facility] CATH LAB;  Service: Cardiovascular;  Laterality: N/A;  . THROMBECTOMY / ARTERIOVENOUS GRAFT REVISION  2011   left upper arm  . TUBAL LIGATION  1980's  . UPPER EXTREMITY ANGIOGRAPHY Bilateral 12/06/2016   Procedure: Upper Extremity Angiography;  Surgeon: Katha Cabal, MD;  Location: [place] CV LAB;  Service:  Cardiovascular;  Laterality: Bilateral;  . UPPER EXTREMITY INTERVENTION Left 06/06/2017   Procedure: UPPER EXTREMITY INTERVENTION;  Surgeon: Katha Cabal, MD;  Location: [place] CV LAB;  Service: Cardiovascular;  Laterality: Left;    Current Outpatient Medications  Medication Sig Dispense Refill  . amiodarone (PACERONE) 200 MG tablet Take 1 tablet (200 mg total) by mouth daily. Starting July 2 90 tablet 3  . aspirin EC 81 MG tablet Take 1 tablet (81 mg total) by mouth daily. Restart on 03/13/19    . cyclobenzaprine (FLEXERIL) 5 MG tablet Take 5 mg by mouth 3 (three) times daily as needed for muscle spasms.    . fluticasone (FLONASE) 50 MCG/ACT nasal spray Place 1 spray at bedtime as needed into both nostrils for allergies.     Marland Kitchen gabapentin (NEURONTIN) 100 MG capsule Take 100 mg by mouth 2 (two) times daily.     . isosorbide mononitrate (IMDUR) 60 MG 24 hr tablet Take 1 tablet (60 mg total) by mouth 2 (two) times daily. 60 tablet 6  . lidocaine-prilocaine (EMLA) cream Apply 1 application topically every Monday, Wednesday, and Friday. Prior to dialysis    . loratadine (CLARITIN) 10 MG tablet Take 1 tablet (10 mg total) by mouth daily as needed for allergies. 30 tablet 0  . multivitamin (RENA-VIT) TABS tablet Take 1 tablet by mouth daily.    . nitroGLYCERIN (NITROSTAT) 0.4 MG SL tablet DISSOLVE ONE TABLET UNDER THE TONGUE EVERY 5 MINUTES AS NEEDED FOR CHEST PAIN.  DO NOT EXCEED A TOTAL OF 3 DOSES IN 15 MINUTES 30 tablet 2  . omeprazole (PRILOSEC) 40 MG capsule Take 1 capsule (40 mg total) by mouth daily before breakfast. 30 capsule 5  . ondansetron (ZOFRAN-ODT) 4 MG disintegrating tablet Take 4 mg by mouth every 8 (eight) hours as needed for nausea or vomiting.     . sevelamer carbonate (RENVELA) 800 MG tablet Take 1,600-2,400 mg by mouth 3 (three) times daily with meals. Takes 3 tablets with meals and 2 tablets with snacks    . simvastatin (ZOCOR) 20 MG tablet TAKE 1 TABLET BY MOUTH ONCE  DAILY AT BEDTIME (Patient taking differently: Take 20 mg by mouth at bedtime. ) 90 tablet 2  . triamcinolone cream (KENALOG) 0.1 % Apply 1 application topically daily as needed (Dry skin/ itching).     . diphenhydrAMINE (BENADRYL ALLERGY) 25 MG tablet Take 1 tablet (25 mg total) by  mouth once for 1 dose. Take 1 tablet 1 hour prior to scheduled CT 30 tablet 0   No current facility-administered medications for this visit.   Allergies:  Aspirin, Penicillins, Amlodipine, Bactrim [sulfamethoxazole-trimethoprim], Contrast media [iodinated diagnostic agents], Iron, Nitrofurantoin, Tylenol [acetaminophen], Gabapentin, Hydralazine, Dexilant [dexlansoprazole], Levaquin [levofloxacin in d5w], Morphine and related, Plavix [clopidogrel bisulfate], Protonix [pantoprazole sodium], and Venofer [ferric oxide]   Social History: The patient  reports that she has never smoked. She has never used smokeless tobacco. She reports that she does not drink alcohol and does not use drugs.   Family History: The patient's family history includes Colon cancer (age of onset: 20) in her child; Diabetes in her brother, father, mother, and sister; Heart attack in her brother, brother, and mother; Heart disease in her father and mother; Hyperlipidemia in her brother, father, and mother; Hypertension in her father, mother, sister, and sister; Other in an other family member.   ROS:  Please see the history of present illness. Otherwise, complete review of systems is positive for none.  All other systems are reviewed and negative.   Physical Exam: VS:  BP (!) 164/60   Pulse 63   Ht 5\' 2"  (1.575 m)   Wt 135 lb (61.2 kg)   SpO2 95%   BMI 24.69 kg/m , BMI Body mass index is 24.69 kg/m.  Wt Readings from Last 3 Encounters:  02/14/20 135 lb (61.2 kg)  02/12/20 133 lb (60.3 kg)  12/26/19 135 lb 8 oz (61.5 kg)    General: Patient appears comfortable at rest. HEENT: Conjunctiva and lids normal, oropharynx clear with moist  mucosa. Neck: Supple, no elevated JVP or carotid bruits, no thyromegaly. Lungs: Clear to auscultation, nonlabored breathing at rest. Cardiac: Regular rate and rhythm, no S3 or significant systolic murmur, no pericardial rub. Abdomen: Soft, nontender, no hepatomegaly, bowel sounds present, no guarding or rebound. Extremities: No pitting edema, distal pulses 2+. Skin: Warm and dry. Musculoskeletal: No kyphosis. Neuropsychiatric: Alert and oriented x3, affect grossly appropriate.  ECG:  EKG 02/12/2020 sinus rhythm 71, probable LAE, LVH with secondary repolarization abnormality, anterior infarct, old  Recent Labwork: 12/13/2019: ALT 9; AST 8 02/12/2020: BUN 16; Creatinine, Ser 4.84; Hemoglobin 9.3; Platelets 289; Potassium 3.7; Sodium 136     Component Value Date/Time   CHOL 93 10/16/2017 0539   TRIG 56 10/16/2017 0539   HDL 56 10/16/2017 0539   CHOLHDL 1.7 10/16/2017 0539   VLDL 11 10/16/2017 0539   LDLCALC 26 10/16/2017 0539    Other Studies Reviewed Today:   Cardiac Catheterization: 07/2018  Ost RPDA to RPDA lesion is 75% stenosed.  Mid LM to Dist LM lesion is 60% stenosed.  Ost Ramus to Ramus lesion is 80% stenosed.  Ost Cx to Mid Cx lesion is 99% stenosed.  Prox LAD lesion is 90% stenosed.  Ost 1st Diag to 1st Diag lesion is 100% stenosed.  Ost RCA to Prox RCA lesion is 100% stenosed.  LIMA graft was visualized by angiography and is large.  The graft exhibits no disease.  SVG graft was visualized by angiography and is large.  The graft exhibits no disease.  SVG graft was visualized by angiography and is normal in caliber.  SVG graft was visualized by angiography and is large.  The graft exhibits no disease.  The left ventricular systolic function is normal.  LV end diastolic pressure is mildly elevated.  The left ventricular ejection fraction is 55-65% by visual estimate.  1. Severe 3 vessel occlusive CAD -  60% distal left main - 90% mid LAD -  100% first diagonal at prior stent site - 99% proximal LCx at prior stent site - 80% ramus intermediate. - 100% ostial RCA at prior stent site - 75% proximal PDA 2. Patent LIMA to the LAD 3. Patent SVG to ramus intermediate 4. Patent SVG to OM 5. Patent SVG to the distal RCA. 6. Good LV function 7. Mildly elevated LVEDP  Plan: Compared to prior cath in April 2018 the diagonal is now occluded. There is progression of disease in the PDA. It is unclear if this is the reason for her chest pain. She is severely hypertensive and EDP is mildly elevated. I would recommend medical management with good BP control. If she has refractory angina despite good BP and volume management we could consider PCI of the PDA but this would have to be done through the SVG and in general she has not responded well to stenting in the past with all prior stents occluded. I will increase her Coreg dose. She will need dialysis in am.    Echocardiogram: 12/2018 IMPRESSIONS    1. The left ventricle has normal systolic function with an ejection  fraction of 60-65%. The cavity size was normal. There is mild concentric  left ventricular hypertrophy. Left ventricular diastolic Doppler  parameters are consistent with impaired  relaxation. Elevated left ventricular end-diastolic pressure No evidence  of left ventricular regional wall motion abnormalities.  2. Left atrial size was mildly dilated.  3. The mitral valve is grossly normal. Mild thickening of the mitral  valve leaflet. There is mild mitral annular calcification present. Mitral  valve regurgitation is mild to moderate by color flow Doppler.  4. The tricuspid valve is grossly normal.  5. The aortic valve is tricuspid. Mild thickening of the aortic valve.  6. Moderate aortic annular calcification.  7. The aortic root is normal in size and structure.    Assessment and Plan:  1. CAD in native artery   2. Paroxysmal atrial fibrillation  (HCC)   3. Mixed hyperlipidemia   4. Essential hypertension   5. ESRD (end stage renal disease) on dialysis ([place])    1. CAD in native artery Denies any anginal or exertional symptoms today.  Recent emergency room visit for chest pain.  Ruled out for ACS.  Continue aspirin 81 mg daily.  Imdur 60 mg daily.  Nitroglycerin sublingual as needed for chest pain  2. Paroxysmal atrial fibrillation (HCC) Denies any recent palpitations or arrhythmias.  Continue amiodarone 200 mg p.o. daily.  3. Mixed hyperlipidemia Continue simvastatin 20 mg p.o. daily.  She has a lipid profile ordered on 12/17/2019 but not done yet.  4. Essential hypertension Blood pressure is 164/90 today.  She states her blood pressure fluctuates.  She states does not tolerate lower blood pressures approaching 329 systolic.  She states she feels presyncopal when this occurs.  She states she will have dialysis later today and blood pressure will be much better  5. ESRD (end stage renal disease) on dialysis [facility]) On HD Monday Wednesday and Friday.  Follows with Dr. Theador Hawthorne.   Medication Adjustments/Labs and Tests Ordered: Current medicines are reviewed at length with the patient today.  Concerns regarding medicines are outlined above.   Disposition: Follow-up with Dr. Harl Bowie or APP 6 months  Signed, Levell July, NP 02/14/2020 8:21 AM    [place] at [place], [place], [place] [address] Phone: [phone]; Fax: [phone]

## 2020-02-14 ENCOUNTER — Encounter: Payer: Self-pay | Admitting: Family Medicine

## 2020-02-14 ENCOUNTER — Ambulatory Visit: Payer: Medicare HMO | Admitting: Family Medicine

## 2020-02-14 VITALS — BP 164/60 | HR 63 | Ht 62.0 in | Wt 135.0 lb

## 2020-02-14 DIAGNOSIS — E782 Mixed hyperlipidemia: Secondary | ICD-10-CM

## 2020-02-14 DIAGNOSIS — I10 Essential (primary) hypertension: Secondary | ICD-10-CM | POA: Diagnosis not present

## 2020-02-14 DIAGNOSIS — I2510 Atherosclerotic heart disease of native coronary artery without angina pectoris: Secondary | ICD-10-CM

## 2020-02-14 DIAGNOSIS — I480 Paroxysmal atrial fibrillation: Secondary | ICD-10-CM | POA: Diagnosis not present

## 2020-02-14 DIAGNOSIS — Z992 Dependence on renal dialysis: Secondary | ICD-10-CM

## 2020-02-14 DIAGNOSIS — N186 End stage renal disease: Secondary | ICD-10-CM

## 2020-02-14 NOTE — Patient Instructions (Addendum)
Medication Instructions:   Your physician recommends that you continue on your current medications as directed. Please refer to the Current Medication list given to you today.  Labwork:  NONE  Testing/Procedures:  NONE  Follow-Up:  Your physician recommends that you schedule a follow-up appointment in: 6 months.  Any Other Special Instructions Will Be Listed Below (If Applicable).  If you need a refill on your cardiac medications before your next appointment, please call your pharmacy. 

## 2020-02-18 ENCOUNTER — Ambulatory Visit: Payer: Medicare HMO | Admitting: Urology

## 2020-02-25 ENCOUNTER — Ambulatory Visit (INDEPENDENT_AMBULATORY_CARE_PROVIDER_SITE_OTHER): Payer: Medicare HMO | Admitting: Internal Medicine

## 2020-02-25 ENCOUNTER — Ambulatory Visit: Payer: Medicare HMO | Admitting: Urology

## 2020-02-25 ENCOUNTER — Other Ambulatory Visit: Payer: Self-pay

## 2020-02-25 ENCOUNTER — Encounter (INDEPENDENT_AMBULATORY_CARE_PROVIDER_SITE_OTHER): Payer: Self-pay | Admitting: Internal Medicine

## 2020-02-25 VITALS — BP 167/65 | HR 69 | Temp 98.8°F | Ht 61.0 in | Wt 134.8 lb

## 2020-02-25 DIAGNOSIS — D539 Nutritional anemia, unspecified: Secondary | ICD-10-CM

## 2020-02-25 DIAGNOSIS — K589 Irritable bowel syndrome without diarrhea: Secondary | ICD-10-CM | POA: Diagnosis not present

## 2020-02-25 MED ORDER — DICYCLOMINE HCL 10 MG PO CAPS: 10.0000 mg | ORAL_CAPSULE | Freq: Two times a day (BID) | ORAL | 5 refills | Status: DC

## 2020-02-25 MED ORDER — NYSTATIN-TRIAMCINOLONE 100000-0.1 UNIT/GM-% EX CREA
1.0000 | TOPICAL_CREAM | Freq: Two times a day (BID) | CUTANEOUS | 0 refills | Status: DC
Start: 2020-02-25 — End: 2020-07-31

## 2020-02-25 NOTE — Progress Notes (Signed)
Presenting complaint;  Follow-up for anemia, GERD.  Abdominal pain and postprandial urgency.  Database and subjective:  Patient is 79 year old Afro-American female who has history of GERD chronic GI bleed secondary to  small bowel angiodysplasia as well as history of colonic polyps who is here for scheduled visit.  Back in March this year she was also treated for possible small intestinal bacterial overgrowth and responded to metronidazole. She was last seen on 12/26/2019. She states her flatulence is back.  She also complains of intermittent lower abdominal pain and postprandial urgency.  It generally occurs after lunch and evening meal but not every time.  Sometimes she has to run to the bathroom even before she finishes a meal and at other times when she has finished eating.  However she does not have diarrhea.  She passes soft to formed stool.  She also has nocturnal bowel movement every now and then.  She denies melena or rectal bleeding.  She says heartburn is well controlled with therapy.  Her appetite is good. She says she was begun on antibiotic for right facial swelling possibly due to sinusitis.  She does not remember the name of the medication.  Since then she has had perianal itching.  Current Medications: Outpatient Encounter Medications as of 02/25/2020  Medication Sig  . amiodarone (PACERONE) 200 MG tablet Take 1 tablet (200 mg total) by mouth daily. Starting July 2  . aspirin EC 81 MG tablet Take 1 tablet (81 mg total) by mouth daily. Restart on 03/13/19  . cyclobenzaprine (FLEXERIL) 5 MG tablet Take 5 mg by mouth 3 (three) times daily as needed for muscle spasms.  . fluticasone (FLONASE) 50 MCG/ACT nasal spray Place 1 spray at bedtime as needed into both nostrils for allergies.   . isosorbide mononitrate (IMDUR) 60 MG 24 hr tablet Take 1 tablet (60 mg total) by mouth 2 (two) times daily.  Marland Kitchen lidocaine-prilocaine (EMLA) cream Apply 1 application topically every Monday, Wednesday, and  Friday. Prior to dialysis  . loratadine (CLARITIN) 10 MG tablet Take 1 tablet (10 mg total) by mouth daily as needed for allergies.  . multivitamin (RENA-VIT) TABS tablet Take 1 tablet by mouth daily.  . nitroGLYCERIN (NITROSTAT) 0.4 MG SL tablet DISSOLVE ONE TABLET UNDER THE TONGUE EVERY 5 MINUTES AS NEEDED FOR CHEST PAIN.&nbsp;&nbsp;DO NOT EXCEED A TOTAL OF 3 DOSES IN 15 MINUTES  . omeprazole (PRILOSEC) 40 MG capsule Take 1 capsule (40 mg total) by mouth daily before breakfast.  . ondansetron (ZOFRAN-ODT) 4 MG disintegrating tablet Take 4 mg by mouth every 8 (eight) hours as needed for nausea or vomiting.   . sevelamer carbonate (RENVELA) 800 MG tablet Take 1,600-2,400 mg by mouth 3 (three) times daily with meals. Takes 3 tablets with meals and 2 tablets with snacks  . simvastatin (ZOCOR) 20 MG tablet TAKE 1 TABLET BY MOUTH ONCE DAILY AT BEDTIME (Patient taking differently: Take 20 mg by mouth at bedtime. )  . triamcinolone cream (KENALOG) 0.1 % Apply 1 application topically daily as needed (Dry skin/ itching).   . diphenhydrAMINE (BENADRYL ALLERGY) 25 MG tablet Take 1 tablet (25 mg total) by mouth once for 1 dose. Take 1 tablet 1 hour prior to scheduled CT  . gabapentin (NEURONTIN) 100 MG capsule Take 100 mg by mouth 2 (two) times daily.  (Patient not taking: Reported on 02/25/2020)   No facility-administered encounter medications on file as of 02/25/2020.     Objective: Blood pressure (!) 167/65, pulse 69, temperature 98.8 F (37.1  C), temperature source Oral, height 5' 1"  (1.549 m), weight 134 lb 12.8 oz (61.1 kg). Patient is alert and in no acute distress. She is wearing a mask. Conjunctiva is pink. Sclera is nonicteric Oropharyngeal mucosa is normal. No neck masses or thyromegaly noted. Cardiac exam with regular rhythm normal S1 and S2.  She has faint systolic murmur best heard at aortic area.  Lungs are clear to auscultation. Abdomen is full.  Bowel sounds are normal.  No bruit noted.   On palpation abdomen is soft and nontender per percussion note is sympathetic. No LE edema or clubbing noted. She has AV fistula/shunt in left arm.  Labs/studies Results:  CBC Latest Ref Rng & Units 02/12/2020 12/13/2019 11/22/2019  WBC 4.0 - 10.5 K/uL 7.0 8.7 6.8  Hemoglobin 12.0 - 15.0 g/dL 9.3(L) 9.1(L) 8.8(L)  Hematocrit 36 - 46 % 31.4(L) 30.5(L) 29.5(L)  Platelets 150 - 400 K/uL 289 289 187    CMP Latest Ref Rng & Units 02/12/2020 12/13/2019 11/22/2019  Glucose 70 - 99 mg/dL 94 99 100(H)  BUN 8 - 23 mg/dL 16 16 40(H)  Creatinine 0.44 - 1.00 mg/dL 4.84(H) 4.52(H) 8.08(H)  Sodium 135 - 145 mmol/L 136 136 141  Potassium 3.5 - 5.1 mmol/L 3.7 3.2(L) 5.5(H)  Chloride 98 - 111 mmol/L 98 98 100  CO2 22 - 32 mmol/L 27 27 28   Calcium 8.9 - 10.3 mg/dL 7.6(L) 8.0(L) 8.9  Total Protein 6.5 - 8.1 g/dL - 6.5 -  Total Bilirubin 0.3 - 1.2 mg/dL - 0.3 -  Alkaline Phos 38 - 126 U/L - 139(H) -  AST 15 - 41 U/L - 8(L) -  ALT 0 - 44 U/L - 9 -    Hepatic Function Latest Ref Rng & Units 12/13/2019 10/14/2019 06/19/2019  Total Protein 6.5 - 8.1 g/dL 6.5 - -  Albumin 3.5 - 5.0 g/dL 3.2(L) 3.3(L) 3.2(L)  AST 15 - 41 U/L 8(L) - -  ALT 0 - 44 U/L 9 - -  Alk Phosphatase 38 - 126 U/L 139(H) - -  Total Bilirubin 0.3 - 1.2 mg/dL 0.3 - -  Bilirubin, Direct 0.0 - 0.2 mg/dL - - -    Assessment:  #1.  Chronic GERD.  She is doing well with therapy.  #2.  Macrocytic anemia.  She has anemia which is felt to be due to GI blood loss and chronic disease.  Her MCV has been gradually creeping up.  She had a normal B12 level in 2018 when MCV was normal.  Need to rule out B12 or folate deficiency.  Her hemoglobin however is better than it has been recently.  #3.  Postprandial urgency and lower abdominal pain.  The symptoms are suggestive of IBS.  #4.  Flatulence.  She is suspected to have small intestine bacterial overgrowth and last treated in March 2021.  If symptomatic therapy does not work we will consider retreatment.   She is on antibiotic for presumed sinusitis and remains to be seen if it would help with flatulence.  #5.  Pruritus and I am most likely triggered by antibiotic.   Plan:  Dicyclomine 10 mg by mouth 30 minutes before lunch and evening meal daily.  She will try single dose for few days and if necessary take it twice daily.  Patient informed of potential side effects such as dry mouth and constipation.  If she has any side effects she will stop the medication and call office. Patient will go to the lab for B12 and folate  level. Mycolog-II cream to be applied to periarea twice daily for 1 week and thereafter on as-needed basis. Office visit in 3 months.

## 2020-02-25 NOTE — Patient Instructions (Signed)
Take dicyclomine 10 mg by mouth 30 minutes before lunch.  If 1 pill does not control urgency can take second pill before evening meal as long as you do not have side effects. Physician will call the results of blood test when completed.

## 2020-02-26 LAB — B12 AND FOLATE PANEL
Folate: 18.1 ng/mL
Vitamin B-12: 501 pg/mL (ref 200–1100)

## 2020-03-03 ENCOUNTER — Other Ambulatory Visit: Payer: Self-pay

## 2020-03-03 ENCOUNTER — Encounter: Payer: Self-pay | Admitting: Urology

## 2020-03-03 ENCOUNTER — Ambulatory Visit (INDEPENDENT_AMBULATORY_CARE_PROVIDER_SITE_OTHER): Payer: Medicare HMO | Admitting: Urology

## 2020-03-03 VITALS — BP 189/64 | HR 62 | Temp 98.6°F | Ht 60.0 in | Wt 135.0 lb

## 2020-03-03 DIAGNOSIS — D3001 Benign neoplasm of right kidney: Secondary | ICD-10-CM | POA: Diagnosis not present

## 2020-03-03 NOTE — Progress Notes (Signed)
Urological Symptom Review  Patient is experiencing the following symptoms: none   Review of Systems  Gastrointestinal (upper)  : Negative for upper GI symptoms  Gastrointestinal (lower) : Negative for lower GI symptoms  Constitutional : Negative for symptoms  Skin: Negative for skin symptoms  Eyes: Negative for eye symptoms  Ear/Nose/Throat : Sinus problems  Hematologic/Lymphatic: Negative for Hematologic/Lymphatic symptoms  Cardiovascular : Negative for cardiovascular symptoms  Respiratory : Negative for respiratory symptoms  Endocrine: Negative for endocrine symptoms  Musculoskeletal: Back pain  Neurological: Negative for neurological symptoms  Psychologic: Negative for psychiatric symptoms

## 2020-03-03 NOTE — Progress Notes (Signed)
H&P  Chief Complaint: Renal Mass  History of Present Illness:   8.24.2021: Pt here for 1 year f/u. Pt reported chest pain during dialysis approximately 1 month ago. Pt reports that she has not made urine for approximately 4 years.   Pt reports that she occasionally wakes up to excruciating back pain. Sounds like arthritic pain.  Pt notes that for the past couple of weeks she has had a reduced appetite but she submits that her weight has remained stable.  (below copied from [facility] records):  Shawna [name] is a 79 year-old female established patient who is here for a renal mass.  The problem is on the right side. Her problem was diagnosed 03/18/2017. She has had no symptoms. She had the following x-rays done: CT Scan.   She does have a good appetite. BOWEL HABITS: her bowels are moving normally. She is not having pain in new locations. She has not recently had unwanted weight loss.   3.12.2019: 79 year old female on chronic hemodialysis with history of coronary artery disease.  9.18.2018--CT of the abdomen and pelvis showing a right renal a mass--23 mm height, 29 mm witdh. . The patient is asymptomatic from this. She has had no flank pain, and does not make urine anymore.  6.20.2019: Repeat CT A/P. Possible increased size of rt upper pole mass (32x25 mm vs 25x19 mm on 9.18.2018). However, coronal projections similar (20x36 mm vs 25x35 mm on 9.18.2018).  9.10.2019: She only c/o itching while on dialysis.   6.23.2020: The patient has not had her CT scan, as schedule earlier this year. Apparently, she mixed up on proper timing for her free medications. She has had no flank pain. She does not produce urine so she does not know if there has been blood in the urine. No unplanned weight gain or weight loss. Recent hospitalization for treatment of pulmonary issue.   8.6.2020: CT of abdomen/pelvis without IV contrast. The to the urinary tract, there was bilateral renal atrophy. Prior low density lesions are  not visualized on this CT due to lack of IV contrast. Persistent exophytic lesion in the upper pole of the right kidney measuring 3.2 x 2.3 cm, stable in size. No evidence of lymphadenopathy in the abdomen or pelvis.   8.25.2020: She returns today for follow-up. She continues on dialysis and is no longer producing urine. She denies any constitutional sx's. She has recently been having stomach pain with rectal bleeding but this is being monitored and treated elsewhere.  CT abdomen and pelvis revealed stable right upper pole renal mass.     Past Medical History:  Diagnosis Date  . Acute on chronic respiratory failure with hypoxia ([place]) 10/10/2016  . Anxiety   . Arthritis   . AVM (arteriovenous malformation) of colon   . CAD (coronary artery disease)    a. s/p CABG in 2013 b. DES to D1 in 10/2016. c. cath in 07/2018 showing patent grafts with occlusion of D1 at prior stent site and progression of PDA disease --> medical management recommended  . Carotid artery disease ([place])    a. 10-25% LICA, 02/5276   . Chronic anemia   . Chronic bronchitis ([place])   . Chronic diastolic CHF (congestive heart failure) ([place])    a. 02/2012 Echo EF 60-65%, nl wall motion, Gr 1 DD, mod MR  . Colon cancer ([place]) 1992  . Esophageal stricture   . ESRD on hemodialysis ([place])    ESRD due to HTN, started dialysis 2011 and gets HD [facility]  [place] with Dr Hinda Lenis on MWF schedule.  Access is LUA AVF as of Sept 2014.   Marland Kitchen GERD (gastroesophageal reflux disease)   . High cholesterol 12/2011  . History of blood transfusion 07/2011; 12/2011; 01/2012 X 2; 04/2012  . History of gout   . History of lower GI bleeding   . Hypertension   . Iron deficiency anemia   . Mitral regurgitation    a. Moderate by echo, 02/2012  . Myocardial infarction ([place])   . NSVT (nonsustained ventricular tachycardia) ([place])   . Ovarian cancer ([place]) 1992  . PAF (paroxysmal atrial fibrillation) ([place])   . Pneumonia ~ 2009  . PUD (peptic ulcer disease)   .  TIA (transient ischemic attack)     Past Surgical History:  Procedure Laterality Date  . A/V SHUNTOGRAM Left 03/19/2019   Procedure: A/V SHUNTOGRAM;  Surgeon: Katha Cabal, MD;  Location: [name] [facility];  Service: Cardiovascular;  Laterality: Left;  . ABDOMINAL HYSTERECTOMY  1992  . APPENDECTOMY  06/1990  . AV FISTULA PLACEMENT  07/2009   left upper arm  . AV FISTULA PLACEMENT Right 09/06/2016   Procedure: RIGHT FOREARM ARTERIOVENOUS (AV) GRAFT;  Surgeon: Elam Dutch, MD;  Location: [facility] OR;  Service: Vascular;  Laterality: Right;  . AV FISTULA PLACEMENT N/A 02/24/2017   Procedure: INSERTION OF ARTERIOVENOUS (AV) GORE-TEX GRAFT ARM (BRACHIAL AXILLARY);  Surgeon: Katha Cabal, MD;  Location: [facility] ORS;  Service: Vascular;  Laterality: N/A;  . [place] Right 09/06/2016   Procedure: REMOVAL OF Right Arm ARTERIOVENOUS GORETEX GRAFT and Vein Patch angioplasty of brachial artery;  Surgeon: Angelia Mould, MD;  Location: [place];  Service: Vascular;  Laterality: Right;  . BIOPSY  09/26/2019   Procedure: BIOPSY;  Surgeon: Rogene Houston, MD;  [facility];  Service: Endoscopy;;  . COLON RESECTION  1992  . COLON SURGERY    . COLONOSCOPY N/A 03/09/2019   Procedure: COLONOSCOPY;  Surgeon: Rogene Houston, MD;  [facility];  Service: Endoscopy;  Laterality: N/A;  . CORONARY ANGIOPLASTY WITH STENT PLACEMENT  12/15/11   "2"  . CORONARY ANGIOPLASTY WITH STENT PLACEMENT  y/2013   "1; makes total of 3" (05/02/2012)  . CORONARY ARTERY BYPASS GRAFT  06/13/2012   Procedure: CORONARY ARTERY BYPASS GRAFTING (CABG);  Surgeon: Grace Isaac, MD;  Location: [place];  Service: Open Heart Surgery;  Laterality: N/A;  cabg x four;  using left internal mammary artery, and left leg greater saphenous vein harvested endoscopically  . CORONARY STENT INTERVENTION N/A 10/13/2016   Procedure: Coronary Stent Intervention;  Surgeon: Troy Sine, MD;  Location: [place] CV  LAB;  Service: Cardiovascular;  Laterality: N/A;  . DIALYSIS/PERMA CATHETER REMOVAL N/A 04/18/2017   Procedure: DIALYSIS/PERMA CATHETER REMOVAL;  Surgeon: Katha Cabal, MD;  Location: [place] [facility] LAB;  Service: Cardiovascular;  Laterality: N/A;  . DILATION AND CURETTAGE OF UTERUS    . ESOPHAGOGASTRODUODENOSCOPY  01/20/2012   Procedure: ESOPHAGOGASTRODUODENOSCOPY (EGD);  Surgeon: Ladene Artist, MD,FACG;  Location: [facility] ENDOSCOPY;  Service: Endoscopy;  Laterality: N/A;  . ESOPHAGOGASTRODUODENOSCOPY N/A 03/26/2013   Procedure: ESOPHAGOGASTRODUODENOSCOPY (EGD);  Surgeon: Irene Shipper, MD;  Location: [facility] ENDOSCOPY;  Service: Endoscopy;  Laterality: N/A;  . ESOPHAGOGASTRODUODENOSCOPY N/A 04/30/2015   Procedure: ESOPHAGOGASTRODUODENOSCOPY (EGD);  Surgeon: Rogene Houston, MD;  [facility];  Service: Endoscopy;  Laterality: N/A;  1pm - moved to 10/20 @ 1:10  . ESOPHAGOGASTRODUODENOSCOPY N/A 07/29/2016  Procedure: ESOPHAGOGASTRODUODENOSCOPY (EGD);  Surgeon: Manus Gunning, MD;  Location: [place];  Service: Gastroenterology;  Laterality: N/A;  enteroscopy  . ESOPHAGOGASTRODUODENOSCOPY N/A 09/26/2019   Procedure: ESOPHAGOGASTRODUODENOSCOPY (EGD);  Surgeon: Rogene Houston, MD;  [facility];  Service: Endoscopy;  Laterality: N/A;  1250  . GIVENS CAPSULE STUDY N/A 03/07/2019   Procedure: GIVENS CAPSULE STUDY;  Surgeon: Rogene Houston, MD;  [facility];  Service: Endoscopy;  Laterality: N/A;  7:30  . INTRAOPERATIVE TRANSESOPHAGEAL ECHOCARDIOGRAM  06/13/2012   Procedure: INTRAOPERATIVE TRANSESOPHAGEAL ECHOCARDIOGRAM;  Surgeon: Grace Isaac, MD;  Location: [place];  Service: Open Heart Surgery;  Laterality: N/A;  . IR GENERIC HISTORICAL  07/26/2016   IR FLUORO GUIDE CV LINE RIGHT 07/26/2016 Greggory Keen, MD [facility]  . IR GENERIC HISTORICAL  07/26/2016   IR US GUIDE VASC ACCESS RIGHT 07/26/2016 Greggory Keen, MD [facility]  . IR GENERIC HISTORICAL   08/02/2016   IR US GUIDE VASC ACCESS RIGHT 08/02/2016 Greggory Keen, MD [facility]  . IR GENERIC HISTORICAL  08/02/2016   IR FLUORO GUIDE CV LINE RIGHT 08/02/2016 Greggory Keen, MD [facility]  . IR RADIOLOGY PERIPHERAL GUIDED IV START  03/28/2017  . IR US GUIDE VASC ACCESS RIGHT  03/28/2017  . LEFT HEART CATH AND CORONARY ANGIOGRAPHY N/A 09/20/2016   Procedure: Left Heart Cath and Coronary Angiography;  Surgeon: [name], MD;  Location: [place] [facility] LAB;  Service: Cardiovascular;  Laterality: N/A;  . LEFT HEART CATH AND CORS/GRAFTS ANGIOGRAPHY N/A 10/13/2016   Procedure: Left Heart Cath and Cors/Grafts Angiography;  Surgeon: Troy Sine, MD;  Location: [place] [facility] LAB;  Service: Cardiovascular;  Laterality: N/A;  . LEFT HEART CATH AND CORS/GRAFTS ANGIOGRAPHY N/A 07/13/2018   Procedure: LEFT HEART CATH AND CORS/GRAFTS ANGIOGRAPHY;  Surgeon: Martinique, Peter M, MD;  Location: [place] [facility];  Service: Cardiovascular;  Laterality: N/A;  . LEFT HEART CATHETERIZATION WITH CORONARY ANGIOGRAM N/A 12/15/2011   Procedure: LEFT HEART CATHETERIZATION WITH CORONARY ANGIOGRAM;  Surgeon: Burnell Blanks, MD;  Location: [facility] CATH LAB;  Service: Cardiovascular;  Laterality: N/A;  . LEFT HEART CATHETERIZATION WITH CORONARY ANGIOGRAM N/A 01/10/2012   Procedure: LEFT HEART CATHETERIZATION WITH CORONARY ANGIOGRAM;  Surgeon: Peter M Martinique, MD;  Location: [facility] CATH LAB;  Service: Cardiovascular;  Laterality: N/A;  . LEFT HEART CATHETERIZATION WITH CORONARY ANGIOGRAM N/A 06/08/2012   Procedure: LEFT HEART CATHETERIZATION WITH CORONARY ANGIOGRAM;  Surgeon: Burnell Blanks, MD;  Location: [facility] CATH LAB;  Service: Cardiovascular;  Laterality: N/A;  . LEFT HEART CATHETERIZATION WITH CORONARY/GRAFT ANGIOGRAM N/A 12/10/2013   Procedure: LEFT HEART CATHETERIZATION WITH Beatrix Fetters;  Surgeon: [name], MD;  Location: [facility] C[facility] LAB;  Service: Cardiovascular;  Laterality: N/A;  . OVARY SURGERY      ovarian cancer  . POLYPECTOMY  03/09/2019   Procedure: POLYPECTOMY;  Surgeon: Rogene Houston, MD;  [facility];  Service: Endoscopy;;  cecal   . POLYPECTOMY N/A 09/26/2019   Procedure: DUODENAL POLYPECTOMY;  Surgeon: Rogene Houston, MD;  [facility];  Service: Endoscopy;  Laterality: N/A;  . REVISION OF ARTERIOVENOUS GORETEX GRAFT N/A 02/24/2017   Procedure: REVISION OF ARTERIOVENOUS GORETEX GRAFT (RESECTION);  Surgeon: Katha Cabal, MD;  Location: [facility] ORS;  Service: Vascular;  Laterality: N/A;  Earney Mallet N/A 10/15/2013   Procedure: Fistulogram;  Surgeon: Serafina Mitchell, MD;  Location: [facility] CATH LAB;  Service: Cardiovascular;  Laterality: N/A;  . THROMBECTOMY /  ARTERIOVENOUS GRAFT REVISION  2011   left upper arm  . TUBAL LIGATION  1980's  . UPPER EXTREMITY ANGIOGRAPHY Bilateral 12/06/2016   Procedure: Upper Extremity Angiography;  Surgeon: Katha Cabal, MD;  Location: [place] [facility] LAB;  Service: Cardiovascular;  Laterality: Bilateral;  . UPPER EXTREMITY INTERVENTION Left 06/06/2017   Procedure: UPPER EXTREMITY INTERVENTION;  Surgeon: Katha Cabal, MD;  Location: [place] [facility];  Service: Cardiovascular;  Laterality: Left;    Home Medications:  (Not in a hospital admission)   Allergies:  Allergies  Allergen Reactions  . Aspirin Other (See Comments)    High Doses Mess up her stomach; "makes my bowels have blood in them". Takes 81 mg EC Aspirin   . Penicillins Other (See Comments)    SYNCOPE? , "makes me real weak when I take it; like I'll pass out"  Has patient had a PCN reaction causing immediate rash, facial/tongue/throat swelling, SOB or lightheadedness with hypotension: Yes Has patient had a PCN reaction causing severe rash involving mucus membranes or skin necrosis: no Has patient had a PCN reaction that required hospitalization no Has patient had a PCN reaction occurring within the last 10 years: no If all of the above   . Amlodipine Swelling  . Bactrim [Sulfamethoxazole-Trimethoprim] Rash  . Contrast Media [Iodinated Diagnostic Agents] Itching  . Iron Itching and Other (See Comments)    "they gave me iron in dialysis; had to give me Benadryl cause I had to have the iron" (05/02/2012)  . Nitrofurantoin Hives  . Tylenol [Acetaminophen] Itching and Other (See Comments)    Makes her feet on fire per pt  . Gabapentin Other (See Comments)    Unknown reaction  . Hydralazine Itching  . Dexilant [Dexlansoprazole] Other (See Comments)    Upset stomach  . Levaquin [Levofloxacin In D5w] Rash  . Morphine And Related Itching and Other (See Comments)    Itching in feet  . Plavix [Clopidogrel Bisulfate] Rash  . Protonix [Pantoprazole Sodium] Rash  . Venofer [Ferric Oxide] Itching and Other (See Comments)    Patient reports using Benadryl prior to doses as [facility]    Family History  Problem Relation Age of Onset  . Heart disease Mother        Heart Disease before age 1  . Hyperlipidemia Mother   . Hypertension Mother   . Diabetes Mother   . Heart attack Mother   . Heart disease Father        Heart Disease before age 58  . Hyperlipidemia Father   . Hypertension Father   . Diabetes Father   . Diabetes Sister   . Hypertension Sister   . Diabetes Brother   . Hyperlipidemia Brother   . Heart attack Brother   . Hypertension Sister   . Heart attack Brother   . Colon cancer Child 51  . Other Other        noncontributory for early CAD  . Esophageal cancer Neg Hx   . Liver disease Neg Hx   . Kidney disease Neg Hx   . Colon polyps Neg Hx     Social History:  reports that she has never smoked. She has never used smokeless tobacco. She reports that she does not drink alcohol and does not use drugs.  ROS: A complete review of systems was performed.  All systems are negative except for pertinent findings as noted.  Physical Exam:  Vital signs in last 24 hours:   General:  Alert  and oriented, No  acute distress HEENT: Normocephalic, atraumatic Neck: No JVD or lymphadenopathy Cardiovascular: Regular rate  Lungs: Normal inspiratory/expiratory excursion Extremities: No edema Neurologic: Grossly intact  I have reviewed prior pt notes  I have reviewed notes from referring/previous physicians  I have reviewed urinalysis results  I have independently reviewed prior imaging   Impression/Assessment:  Right upper pole renal mass, fairly stable on last 2 CT scans.  She has not been imaged for over a year.  Plan:  1. Pt to f/u with C.T. abdomen & pelvis - w/out contrast   2. F/U in one year for symptom recheck.  CC: Dr. [place] & Dr. Dewitt Rota 03/03/2020, 1:59 PM  Lillette Boxer. Dahlstedt MD

## 2020-03-04 ENCOUNTER — Other Ambulatory Visit (INDEPENDENT_AMBULATORY_CARE_PROVIDER_SITE_OTHER): Payer: Self-pay | Admitting: *Deleted

## 2020-03-04 DIAGNOSIS — D649 Anemia, unspecified: Secondary | ICD-10-CM

## 2020-03-04 DIAGNOSIS — Z8719 Personal history of other diseases of the digestive system: Secondary | ICD-10-CM

## 2020-03-04 DIAGNOSIS — D539 Nutritional anemia, unspecified: Secondary | ICD-10-CM

## 2020-03-04 DIAGNOSIS — K921 Melena: Secondary | ICD-10-CM

## 2020-03-05 ENCOUNTER — Telehealth: Payer: Medicare HMO | Admitting: Cardiovascular Disease

## 2020-03-10 ENCOUNTER — Ambulatory Visit: Payer: Medicare HMO | Admitting: Cardiovascular Disease

## 2020-03-30 ENCOUNTER — Other Ambulatory Visit: Payer: Self-pay | Admitting: Urology

## 2020-03-30 DIAGNOSIS — D3001 Benign neoplasm of right kidney: Secondary | ICD-10-CM

## 2020-03-31 ENCOUNTER — Other Ambulatory Visit (INDEPENDENT_AMBULATORY_CARE_PROVIDER_SITE_OTHER): Payer: Self-pay | Admitting: Gastroenterology

## 2020-03-31 ENCOUNTER — Ambulatory Visit (HOSPITAL_COMMUNITY): Payer: Medicare HMO

## 2020-03-31 MED ORDER — OMEPRAZOLE 40 MG PO CPDR: 40.0000 mg | DELAYED_RELEASE_CAPSULE | Freq: Every day | ORAL | 5 refills | Status: DC

## 2020-03-31 NOTE — Progress Notes (Signed)
Refill sent to pharmacy.   

## 2020-04-06 ENCOUNTER — Other Ambulatory Visit: Payer: Self-pay

## 2020-04-06 ENCOUNTER — Ambulatory Visit (HOSPITAL_COMMUNITY)
Admission: RE | Admit: 2020-04-06 | Discharge: 2020-04-06 | Disposition: A | Discharge status: Home / Self Care | Payer: Medicare HMO | Source: Ambulatory Visit | Attending: Urology | Admitting: Urology

## 2020-04-06 DIAGNOSIS — D3001 Benign neoplasm of right kidney: Secondary | ICD-10-CM

## 2020-04-06 IMAGING — CT CT ABD-PELV W/O CM
2 of 4 series · 15 of 46 positions shown, 17 images · non-contrast
Comparison: [DATE], [DATE], [DATE], [DATE],
[DATE]

CLINICAL DATA: Follow-up superior pole right renal lesion

EXAM:
CT ABDOMEN AND PELVIS WITHOUT CONTRAST
TECHNIQUE: Multidetector CT imaging of the abdomen and pelvis was performed
following the standard protocol without IV contrast.

[Series 2: axial st · axial · 0.69mm/px · z∈[-401,-41]mm · 12 of 82 slices shown, 14 images]
[im 5/82  soft-tissue]
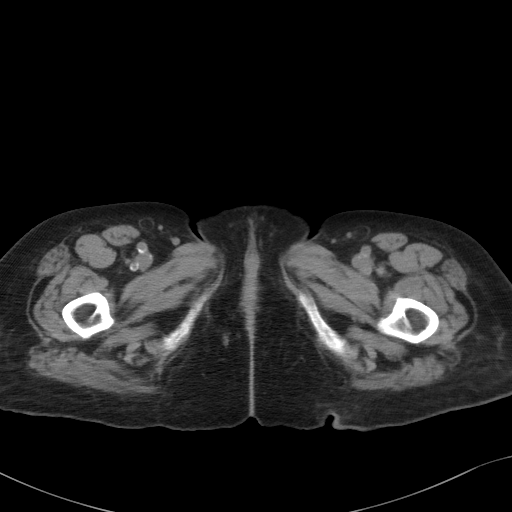
[im 5/82  bone]
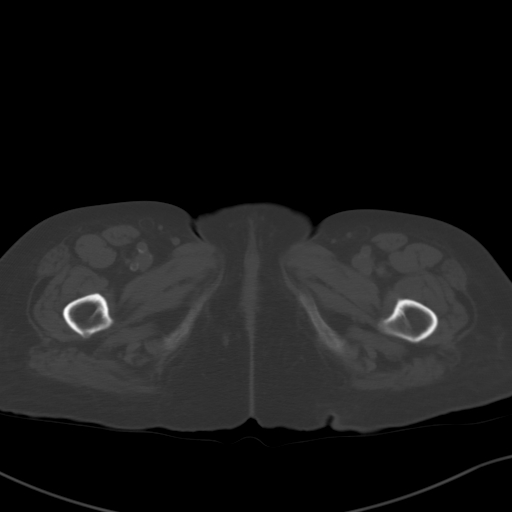
[im 13/82  soft-tissue]
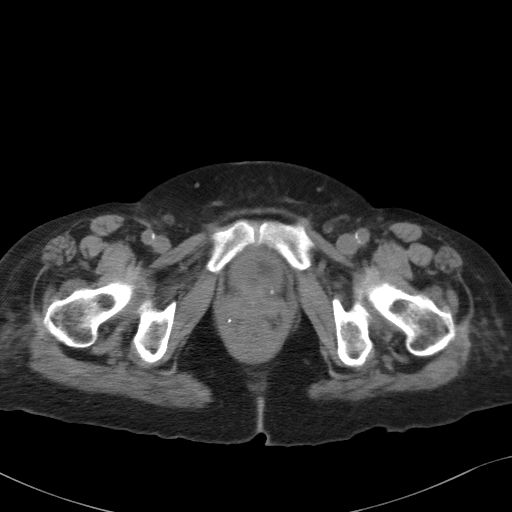
[im 17/82  soft-tissue]
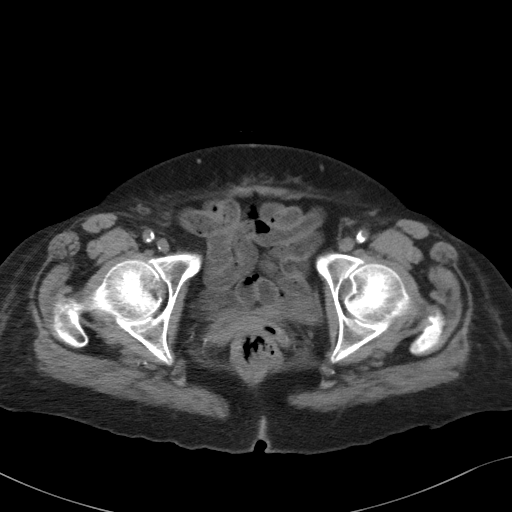
[im 25/82  soft-tissue]
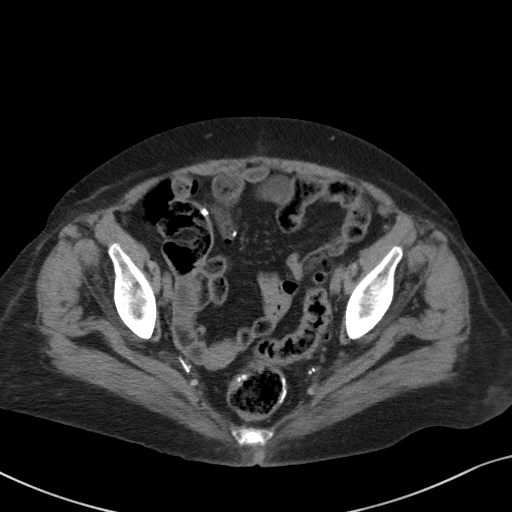
[im 33/82  soft-tissue]
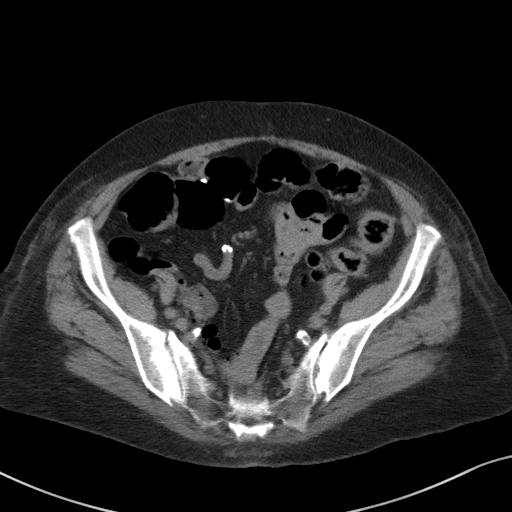
[im 37/82  soft-tissue]
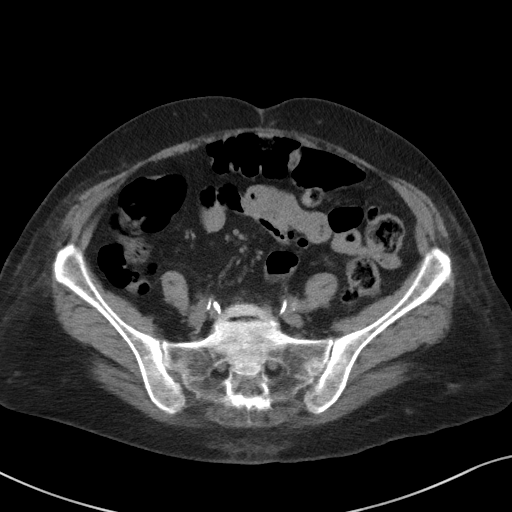
[im 45/82  soft-tissue]
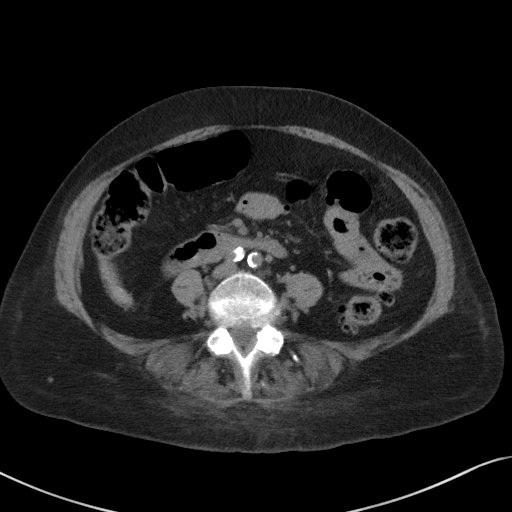
[im 49/82  soft-tissue]
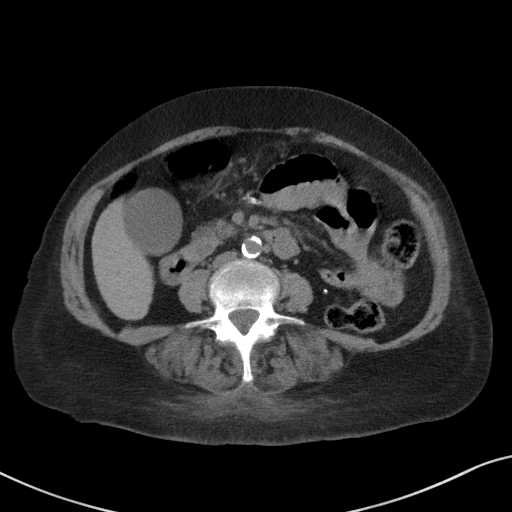
[im 57/82  soft-tissue]
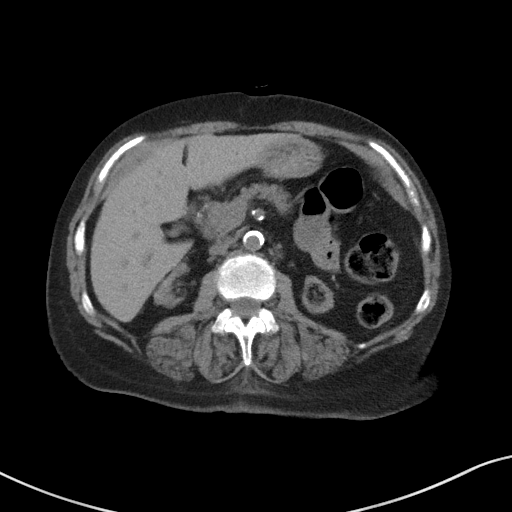
[im 57/82  bone]
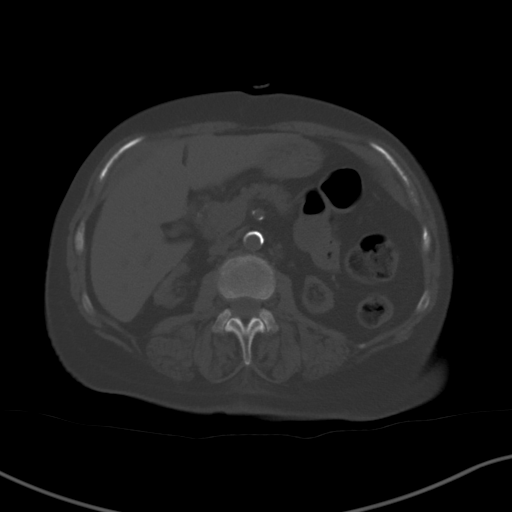
[im 65/82  soft-tissue]
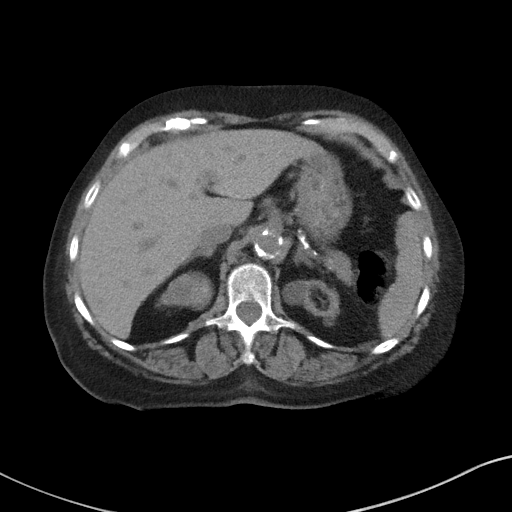
[im 69/82  soft-tissue]
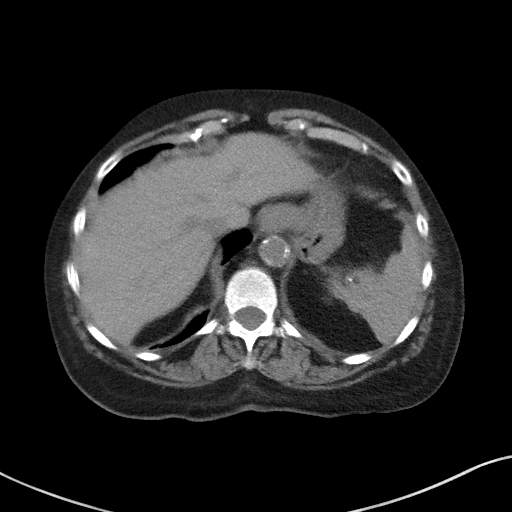
[im 77/82  soft-tissue]
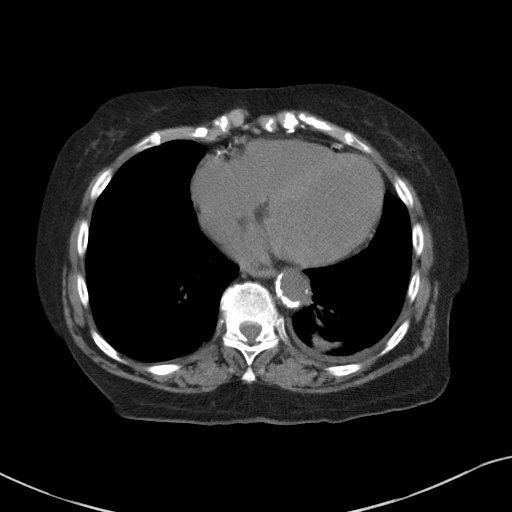

[Series 5: coronal st · coronal · 0.66mm/px · 3 of 91 slices shown]
[im 31/91  soft-tissue]
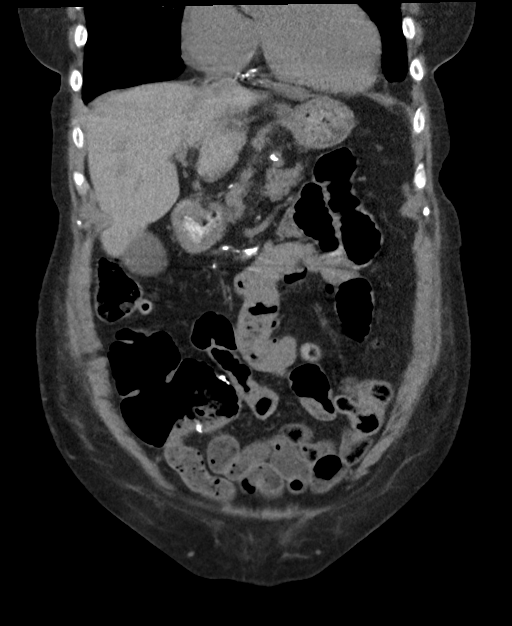
[im 41/91  soft-tissue]
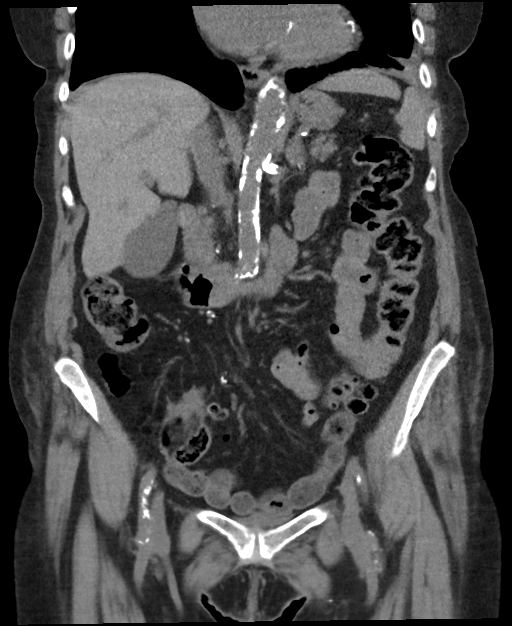
[im 51/91  soft-tissue]
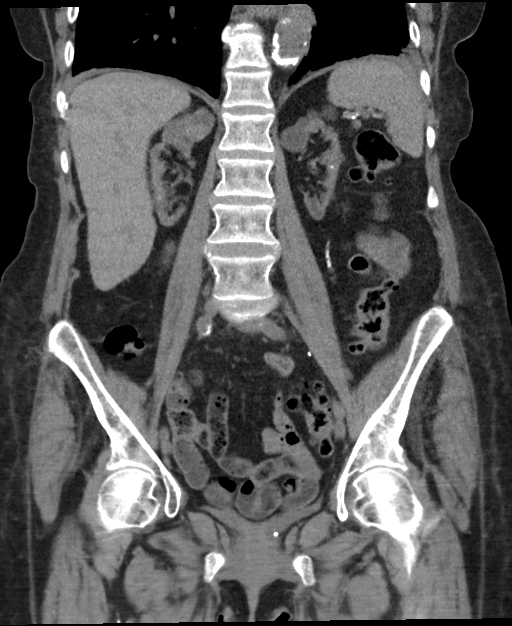

[15 of 46 positions shown; findings below may reference images not displayed]

FINDINGS: Lower chest: No acute abnormality. Bandlike scarring and or
atelectasis of the bilateral lung bases. Coronary artery
calcifications.

Hepatobiliary: No solid liver abnormality is seen. No gallstones,
gallbladder wall thickening, or biliary dilatation.

Pancreas: Unremarkable. No pancreatic ductal dilatation or
surrounding inflammatory changes.

Spleen: Normal in size without significant abnormality.

Adrenals/Urinary Tract: Adrenal glands are unremarkable. Very
atrophic appearance of the kidneys with small, generally
low-attenuation renal lesions. There is a redemonstrated lobulated
lesion of the superior pole of the right kidney measuring
approximately 2.8 x 2.0 cm, of intermediate attenuation, HU = 28,
and at most very minimally increased in size in comparison to
multiple prior examinations dating back to [DATE] (series 2,
image 17). Tiny vascular calcifications and or nonobstructive renal
calculi. No hydronephrosis. Bladder is unremarkable.

Stomach/Bowel: Stomach is within normal limits. Status post terminal
ileocolectomy and reanastomosis. No evidence of bowel wall
thickening, distention, or inflammatory changes. Pancolonic
diverticulosis.

Vascular/Lymphatic: Aortic atherosclerosis. No enlarged abdominal or
pelvic lymph nodes.

Reproductive: Status post hysterectomy.

Other: No abdominal wall hernia or abnormality. No abdominopelvic
ascites.

Musculoskeletal: No acute or significant osseous findings.
IMPRESSION: 1. Very atrophic appearance of the kidneys in keeping with dialysis
dependent end-stage renal disease with small, generally
low-attenuation renal lesions present bilaterally and not
significantly changed compared to prior examination. There is a
redemonstrated lobulated lesion of the superior pole of the right
kidney measuring approximately 2.8 x 2.0 cm, of intermediate
attenuation, HU = 28, and at most minimally increased in size in
comparison to multiple prior examinations dating back to [3W].
Findings are most consistent with a hemorrhagic or proteinaceous
cyst. No further routine follow-up is required.
2. Status post terminal ileocolectomy and reanastomosis.
3. Pancolonic diverticulosis without evidence of acute
diverticulitis.
4. Coronary artery disease.  Aortic Atherosclerosis ([3W]-[3W]).

## 2020-04-07 ENCOUNTER — Telehealth: Payer: Self-pay

## 2020-04-07 NOTE — Telephone Encounter (Signed)
-----   Message from Franchot Gallo, MD sent at 04/07/2020 12:03 PM EDT ----- Notify patient that CT shows stable appearance of the area on her kidney, good news.Send copy to her PCP and Dr. Theador Hawthorne ----- Message ----- From: Dorisann Frames, RN Sent: 04/07/2020  11:59 AM EDT To: Franchot Gallo, MD  Please review

## 2020-04-07 NOTE — Telephone Encounter (Signed)
Copies sent to PCP and Dr. Theador Hawthorne. lft mes for pt to call back to notify.

## 2020-04-09 ENCOUNTER — Telehealth: Payer: Self-pay

## 2020-04-09 NOTE — Telephone Encounter (Signed)
-----   Message from Franchot Gallo, MD sent at 04/07/2020 12:03 PM EDT ----- Notify patient that CT shows stable appearance of the area on her kidney, good news.Send copy to her PCP and Dr. Theador Hawthorne ----- Message ----- From: Dorisann Frames, RN Sent: 04/07/2020  11:59 AM EDT To: Franchot Gallo, MD  Please review

## 2020-04-09 NOTE — Telephone Encounter (Signed)
Pt notified and results sent to other providers prior.

## 2020-04-14 ENCOUNTER — Telehealth: Payer: Self-pay | Admitting: *Deleted

## 2020-04-14 NOTE — Telephone Encounter (Signed)
Pt c/o swelling in feet over the last few days with dizziness - denies SOB/weight gain/chest pain - BP today 109/70 doesn't know what HR is - aware to continue to monitor swelling and keep BP log over the next few days and call us with readings and update on swelling - aware that if symptoms worsen to call us back or go to ED

## 2020-04-14 NOTE — Telephone Encounter (Signed)
I agree with your assessment.

## 2020-04-16 ENCOUNTER — Ambulatory Visit (INDEPENDENT_AMBULATORY_CARE_PROVIDER_SITE_OTHER): Payer: Medicare HMO

## 2020-04-16 ENCOUNTER — Encounter (INDEPENDENT_AMBULATORY_CARE_PROVIDER_SITE_OTHER): Payer: Self-pay | Admitting: Vascular Surgery

## 2020-04-16 ENCOUNTER — Other Ambulatory Visit: Payer: Self-pay

## 2020-04-16 ENCOUNTER — Ambulatory Visit (INDEPENDENT_AMBULATORY_CARE_PROVIDER_SITE_OTHER): Payer: Medicare HMO | Admitting: Vascular Surgery

## 2020-04-16 VITALS — BP 178/67 | HR 77 | Resp 16 | Wt 134.0 lb

## 2020-04-16 DIAGNOSIS — I480 Paroxysmal atrial fibrillation: Secondary | ICD-10-CM

## 2020-04-16 DIAGNOSIS — N186 End stage renal disease: Secondary | ICD-10-CM

## 2020-04-16 DIAGNOSIS — Z992 Dependence on renal dialysis: Secondary | ICD-10-CM

## 2020-04-16 DIAGNOSIS — T829XXS Unspecified complication of cardiac and vascular prosthetic device, implant and graft, sequela: Secondary | ICD-10-CM

## 2020-04-16 DIAGNOSIS — I10 Essential (primary) hypertension: Secondary | ICD-10-CM | POA: Diagnosis not present

## 2020-04-16 DIAGNOSIS — E782 Mixed hyperlipidemia: Secondary | ICD-10-CM

## 2020-04-16 NOTE — Progress Notes (Signed)
MRN : [id number]  Shawna [name] is a 79 y.o. (12-03-1940) female who presents with chief complaint of No chief complaint on file. Marland Kitchen  History of Present Illness:   The patient returns to the office for followup of their dialysis access. The function of the access has been stable. The patient denies increased bleeding time or increased recirculation. Patient denies difficulty with cannulation. The patient denies hand pain or other symptoms consistent with steal phenomena.  No significant arm swelling.  The patient denies redness or swelling at the access site. The patient denies fever or chills at home or while on dialysis.  The patient denies amaurosis fugax or recent TIA symptoms. There are no recent neurological changes noted. The patient denies claudication symptoms or rest pain symptoms. The patient denies history of DVT, PE or superficial thrombophlebitis. The patient denies recent episodes of angina or shortness of breath.   Duplex ultrasound of the AV access shows a patent access.  The previously noted stenosis is unchanged compared to last study.   No outpatient medications have been marked as taking for the 04/16/20 encounter (Appointment) with Delana Meyer, Dolores Lory, MD.    Past Medical History:  Diagnosis Date  . Acute on chronic respiratory failure with hypoxia ([place]) 10/10/2016  . Anxiety   . Arthritis   . AVM (arteriovenous malformation) of colon   . CAD (coronary artery disease)    a. s/p CABG in 2013 b. DES to D1 in 10/2016. c. cath in 07/2018 showing patent grafts with occlusion of D1 at prior stent site and progression of PDA disease --> medical management recommended  . Carotid artery disease ([place])    a. 62-83% LICA, 12/6292   . Chronic anemia   . Chronic bronchitis ([place])   . Chronic diastolic CHF (congestive heart failure) ([place])    a. 02/2012 Echo EF 60-65%, nl wall motion, Gr 1 DD, mod MR  . Colon cancer ([place]) 1992  . Esophageal stricture   . ESRD on  hemodialysis ([place])    ESRD due to HTN, started dialysis 2011 and gets HD at [facility] with Dr Hinda Lenis on MWF schedule.  Access is LUA AVF as of Sept 2014.   Marland Kitchen GERD (gastroesophageal reflux disease)   . High cholesterol 12/2011  . History of blood transfusion 07/2011; 12/2011; 01/2012 X 2; 04/2012  . History of gout   . History of lower GI bleeding   . Hypertension   . Iron deficiency anemia   . Mitral regurgitation    a. Moderate by echo, 02/2012  . Myocardial infarction ([place])   . NSVT (nonsustained ventricular tachycardia) ([place])   . Ovarian cancer ([place]) 1992  . PAF (paroxysmal atrial fibrillation) ([place])   . Pneumonia ~ 2009  . PUD (peptic ulcer disease)   . TIA (transient ischemic attack)     Past Surgical History:  Procedure Laterality Date  . A/V SHUNTOGRAM Left 03/19/2019   Procedure: A/V SHUNTOGRAM;  Surgeon: Katha Cabal, MD;  Location: [place] [facility];  Service: Cardiovascular;  Laterality: Left;  . ABDOMINAL HYSTERECTOMY  1992  . APPENDECTOMY  06/1990  . AV FISTULA PLACEMENT  07/2009   left upper arm  . AV FISTULA PLACEMENT Right 09/06/2016   Procedure: RIGHT FOREARM ARTERIOVENOUS (AV) GRAFT;  Surgeon: Elam Dutch, MD;  Location: [facility] OR;  Service: Vascular;  Laterality: Right;  . AV FISTULA PLACEMENT N/A 02/24/2017   Procedure: INSERTION OF ARTERIOVENOUS (AV) GORE-TEX GRAFT ARM (BRACHIAL AXILLARY);  Surgeon: Delana Meyer,  Dolores Lory, MD;  Location: [facility] ORS;  Service: Vascular;  Laterality: N/A;  . [place] Right 09/06/2016   Procedure: REMOVAL OF Right Arm ARTERIOVENOUS GORETEX GRAFT and Vein Patch angioplasty of brachial artery;  Surgeon: Angelia Mould, MD;  Location: [place];  Service: Vascular;  Laterality: Right;  . BIOPSY  09/26/2019   Procedure: BIOPSY;  Surgeon: Rogene Houston, MD;  [facility];  Service: Endoscopy;;  . COLON RESECTION  1992  . COLON SURGERY    . COLONOSCOPY N/A 03/09/2019   Procedure: COLONOSCOPY;  Surgeon: Rogene Houston, MD;  [facility];  Service: Endoscopy;  Laterality: N/A;  . CORONARY ANGIOPLASTY WITH STENT PLACEMENT  12/15/11   "2"  . CORONARY ANGIOPLASTY WITH STENT PLACEMENT  y/2013   "1; makes total of 3" (05/02/2012)  . CORONARY ARTERY BYPASS GRAFT  06/13/2012   Procedure: CORONARY ARTERY BYPASS GRAFTING (CABG);  Surgeon: Grace Isaac, MD;  Location: [place];  Service: Open Heart Surgery;  Laterality: N/A;  cabg x four;  using left internal mammary artery, and left leg greater saphenous vein harvested endoscopically  . CORONARY STENT INTERVENTION N/A 10/13/2016   Procedure: Coronary Stent Intervention;  Surgeon: Troy Sine, MD;  Location: [place] [facility];  Service: Cardiovascular;  Laterality: N/A;  . DIALYSIS/PERMA CATHETER REMOVAL N/A 04/18/2017   Procedure: DIALYSIS/PERMA CATHETER REMOVAL;  Surgeon: Katha Cabal, MD;  Location: [place] [facility];  Service: Cardiovascular;  Laterality: N/A;  . DILATION AND CURETTAGE OF UTERUS    . ESOPHAGOGASTRODUODENOSCOPY  01/20/2012   Procedure: ESOPHAGOGASTRODUODENOSCOPY (EGD);  Surgeon: Ladene Artist, MD,FACG;  [facility] ENDOSCOPY;  Service: Endoscopy;  Laterality: N/A;  . ESOPHAGOGASTRODUODENOSCOPY N/A 03/26/2013   Procedure: ESOPHAGOGASTRODUODENOSCOPY (EGD);  Surgeon: Irene Shipper, MD;  Location: [facility] ENDOSCOPY;  Service: Endoscopy;  Laterality: N/A;  . ESOPHAGOGASTRODUODENOSCOPY N/A 04/30/2015   Procedure: ESOPHAGOGASTRODUODENOSCOPY (EGD);  Surgeon: Rogene Houston, MD;  [facility];  Service: Endoscopy;  Laterality: N/A;  1pm - moved to 10/20 @ 1:10  . ESOPHAGOGASTRODUODENOSCOPY N/A 07/29/2016   Procedure: ESOPHAGOGASTRODUODENOSCOPY (EGD);  Surgeon: Manus Gunning, MD;  Location: [place];  Service: Gastroenterology;  Laterality: N/A;  enteroscopy  . ESOPHAGOGASTRODUODENOSCOPY N/A 09/26/2019   Procedure: ESOPHAGOGASTRODUODENOSCOPY (EGD);  Surgeon: Rogene Houston, MD;  [facility];  Service:  Endoscopy;  Laterality: N/A;  1250  . GIVENS CAPSULE STUDY N/A 03/07/2019   Procedure: GIVENS CAPSULE STUDY;  Surgeon: Rogene Houston, MD;  [facility];  Service: Endoscopy;  Laterality: N/A;  7:30  . INTRAOPERATIVE TRANSESOPHAGEAL ECHOCARDIOGRAM  06/13/2012   Procedure: INTRAOPERATIVE TRANSESOPHAGEAL ECHOCARDIOGRAM;  Surgeon: Grace Isaac, MD;  Location: [place];  Service: Open Heart Surgery;  Laterality: N/A;  . IR GENERIC HISTORICAL  07/26/2016   IR FLUORO GUIDE CV LINE RIGHT 07/26/2016 Greggory Keen, MD [facility]  . IR GENERIC HISTORICAL  07/26/2016   IR US GUIDE VASC ACCESS RIGHT 07/26/2016 Greggory Keen, MD [facility]  . IR GENERIC HISTORICAL  08/02/2016   IR US GUIDE VASC ACCESS RIGHT 08/02/2016 Greggory Keen, MD [facility]  . IR GENERIC HISTORICAL  08/02/2016   IR FLUORO GUIDE CV LINE RIGHT 08/02/2016 Greggory Keen, MD [facility]  . IR RADIOLOGY PERIPHERAL GUIDED IV START  03/28/2017  . IR US GUIDE VASC ACCESS RIGHT  03/28/2017  . LEFT HEART CATH AND CORONARY ANGIOGRAPHY N/A 09/20/2016   Procedure: Left Heart Cath and Coronary Angiography;  Surgeon: Mallie Mussel  Nicholes Stairs, MD;  Location: [place] [facility];  Service: Cardiovascular;  Laterality: N/A;  . LEFT HEART CATH AND CORS/GRAFTS ANGIOGRAPHY N/A 10/13/2016   Procedure: Left Heart Cath and Cors/Grafts Angiography;  Surgeon: Troy Sine, MD;  Location: [place] [facility];  Service: Cardiovascular;  Laterality: N/A;  . LEFT HEART CATH AND CORS/GRAFTS ANGIOGRAPHY N/A 07/13/2018   Procedure: LEFT HEART CATH AND CORS/GRAFTS ANGIOGRAPHY;  Surgeon: Martinique, Peter M, MD;  Location: [place] [facility];  Service: Cardiovascular;  Laterality: N/A;  . LEFT HEART CATHETERIZATION WITH CORONARY ANGIOGRAM N/A 12/15/2011   Procedure: LEFT HEART CATHETERIZATION WITH CORONARY ANGIOGRAM;  Surgeon: Burnell Blanks, MD;  Location: [facility] CATH LAB;  Service: Cardiovascular;  Laterality: N/A;  . LEFT HEART CATHETERIZATION WITH CORONARY ANGIOGRAM  N/A 01/10/2012   Procedure: LEFT HEART CATHETERIZATION WITH CORONARY ANGIOGRAM;  Surgeon: Peter M Martinique, MD;  Location: [facility] CATH LAB;  Service: Cardiovascular;  Laterality: N/A;  . LEFT HEART CATHETERIZATION WITH CORONARY ANGIOGRAM N/A 06/08/2012   Procedure: LEFT HEART CATHETERIZATION WITH CORONARY ANGIOGRAM;  Surgeon: Burnell Blanks, MD;  Location: [facility] CATH LAB;  Service: Cardiovascular;  Laterality: N/A;  . LEFT HEART CATHETERIZATION WITH CORONARY/GRAFT ANGIOGRAM N/A 12/10/2013   Procedure: LEFT HEART CATHETERIZATION WITH Beatrix Fetters;  Surgeon: [name], MD;  Location: [facility] CATH LAB;  Service: Cardiovascular;  Laterality: N/A;  . OVARY SURGERY     ovarian cancer  . POLYPECTOMY  03/09/2019   Procedure: POLYPECTOMY;  Surgeon: Rogene Houston, MD;  [facility];  Service: Endoscopy;;  cecal   . POLYPECTOMY N/A 09/26/2019   Procedure: DUODENAL POLYPECTOMY;  Surgeon: Rogene Houston, MD;  [facility];  Service: Endoscopy;  Laterality: N/A;  . REVISION OF ARTERIOVENOUS GORETEX GRAFT N/A 02/24/2017   Procedure: REVISION OF ARTERIOVENOUS GORETEX GRAFT (RESECTION);  Surgeon: Katha Cabal, MD;  Location: [facility] ORS;  Service: Vascular;  Laterality: N/A;  [place] N/A 10/15/2013   Procedure: Fistulogram;  Surgeon: Serafina Mitchell, MD;  Location: [facility] CATH LAB;  Service: Cardiovascular;  Laterality: N/A;  . THROMBECTOMY / ARTERIOVENOUS GRAFT REVISION  2011   left upper arm  . TUBAL LIGATION  1980's  . UPPER EXTREMITY ANGIOGRAPHY Bilateral 12/06/2016   Procedure: Upper Extremity Angiography;  Surgeon: Katha Cabal, MD;  Location: [place] [facility];  Service: Cardiovascular;  Laterality: Bilateral;  . UPPER EXTREMITY INTERVENTION Left 06/06/2017   Procedure: UPPER EXTREMITY INTERVENTION;  Surgeon: Katha Cabal, MD;  Location: [place] [facility];  Service: Cardiovascular;  Laterality: Left;    Social History Social History   Tobacco  Use  . Smoking status: Never Smoker  . Smokeless tobacco: Never Used  Vaping Use  . Vaping Use: Never used  Substance Use Topics  . Alcohol use: No    Alcohol/week: 0.0 standard drinks  . Drug use: No    Family History Family History  Problem Relation Age of Onset  . Heart disease Mother        Heart Disease before age 4  . Hyperlipidemia Mother   . Hypertension Mother   . Diabetes Mother   . Heart attack Mother   . Heart disease Father        Heart Disease before age 37  . Hyperlipidemia Father   . Hypertension Father   . Diabetes Father   . Diabetes Sister   . Hypertension Sister   . Diabetes Brother   . Hyperlipidemia Brother   . Heart attack  Brother   . Hypertension Sister   . Heart attack Brother   . Colon cancer Child 34  . Other Other        noncontributory for early CAD  . Esophageal cancer Neg Hx   . Liver disease Neg Hx   . Kidney disease Neg Hx   . Colon polyps Neg Hx     Allergies  Allergen Reactions  . Aspirin Other (See Comments)    High Doses Mess up her stomach; "makes my bowels have blood in them". Takes 81 mg EC Aspirin   . Penicillins Other (See Comments)    SYNCOPE? , "makes me real weak when I take it; like I'll pass out"  Has patient had a PCN reaction causing immediate rash, facial/tongue/throat swelling, SOB or lightheadedness with hypotension: Yes Has patient had a PCN reaction causing severe rash involving mucus membranes or skin necrosis: no Has patient had a PCN reaction that required hospitalization no Has patient had a PCN reaction occurring within the last 10 years: no If all of the above  . Amlodipine Swelling  . Bactrim [Sulfamethoxazole-Trimethoprim] Rash  . Contrast Media [Iodinated Diagnostic Agents] Itching  . Iron Itching and Other (See Comments)    "they gave me iron in dialysis; had to give me Benadryl cause I had to have the iron" (05/02/2012)  . Nitrofurantoin Hives  . Tylenol [Acetaminophen] Itching and Other (See  Comments)    Makes her feet on fire per pt  . Gabapentin Other (See Comments)    Unknown reaction  . Hydralazine Itching  . Dexilant [Dexlansoprazole] Other (See Comments)    Upset stomach  . Levaquin [Levofloxacin In D5w] Rash  . Morphine And Related Itching and Other (See Comments)    Itching in feet  . Plavix [Clopidogrel Bisulfate] Rash  . Protonix [Pantoprazole Sodium] Rash  . Venofer [Ferric Oxide] Itching and Other (See Comments)    Patient reports using Benadryl prior to doses as [place] (Negative unless checked)  Constitutional: [] Weight loss  [] Fever  [] Chills Cardiac: [] Chest pain   [] Chest pressure   [] Palpitations   [] Shortness of breath when laying flat   [] Shortness of breath with exertion. Vascular:  [] Pain in legs with walking   [] Pain in legs at rest  [] History of DVT   [] Phlebitis   [] Swelling in legs   [] Varicose veins   [] Non-healing ulcers Pulmonary:   [] Uses home oxygen   [] Productive cough   [] Hemoptysis   [] Wheeze  [] COPD   [] Asthma Neurologic:  [] Dizziness   [] Seizures   [] History of stroke   [] History of TIA  [] Aphasia   [] Vissual changes   [] Weakness or numbness in arm   [] Weakness or numbness in leg Musculoskeletal:   [] Joint swelling   [] Joint pain   [] Low back pain Hematologic:  [] Easy bruising  [] Easy bleeding   [] Hypercoagulable state   [] Anemic Gastrointestinal:  [] Diarrhea   [] Vomiting  [] Gastroesophageal reflux/heartburn   [] Difficulty swallowing. Genitourinary:  [x] Chronic kidney disease   [] Difficult urination  [] Frequent urination   [] Blood in urine Skin:  [] Rashes   [] Ulcers  Psychological:  [] History of anxiety   []  History of major depression.  Physical Examination  There were no vitals filed for this visit. There is no height or weight on file to calculate BMI. Gen: WD/WN, NAD Head: [place]/AT, No temporalis wasting.  Ear/Nose/Throat: Hearing grossly intact, nares w/o erythema or drainage Eyes: PER, EOMI, sclera  nonicteric.  Neck: Supple,  no large masses.   Pulmonary:  Good air movement, no audible wheezing bilaterally, no use of accessory muscles.  Cardiac: RRR, no JVD Vascular: Left arm brachial axillary AV graft good thrill good bruit Vessel Right Left  Radial Palpable Palpable  Gastrointestinal: Non-distended. No guarding/no peritoneal signs.  Musculoskeletal: M/S 5/5 throughout.  No deformity or atrophy.  Neurologic: CN 2-12 intact. Symmetrical.  Speech is fluent. Motor exam as listed above. Psychiatric: Judgment intact, Mood & affect appropriate for pt's clinical situation. Dermatologic: No rashes or ulcers noted.  No changes consistent with cellulitis.  CBC Lab Results  Component Value Date   WBC 7.0 02/12/2020   HGB 9.3 (L) 02/12/2020   HCT 31.4 (L) 02/12/2020   MCV 119.4 (H) 02/12/2020   PLT 289 02/12/2020    BMET    Component Value Date/Time   NA 136 02/12/2020 1712   K 3.7 02/12/2020 1712   CL 98 02/12/2020 1712   CO2 27 02/12/2020 1712   GLUCOSE 94 02/12/2020 1712   BUN 16 02/12/2020 1712   CREATININE 4.84 (H) 02/12/2020 1712   CREATININE 6.57 (H) 03/05/2019 1159   CALCIUM 7.6 (L) 02/12/2020 1712   CALCIUM 8.1 (L) 05/29/2013 1814   GFRNONAA 8 (L) 02/12/2020 1712   GFRNONAA 6 (L) 03/05/2019 1159   GFRAA 9 (L) 02/12/2020 1712   GFRAA 6 (L) 03/05/2019 1159   CrCl cannot be calculated (Patient's most recent lab result is older than the maximum 21 days allowed.).  COAG Lab Results  Component Value Date   INR 1.1 03/08/2019   INR 1.1 03/07/2019   INR 1.0 12/14/2018    Radiology CT ABDOMEN PELVIS WO CONTRAST  Result Date: 04/07/2020 CLINICAL DATA:  Follow-up superior pole right renal lesion EXAM: CT ABDOMEN AND PELVIS WITHOUT CONTRAST TECHNIQUE: Multidetector CT imaging of the abdomen and pelvis was performed following the standard protocol without IV contrast. COMPARISON:  03/02/2019, 12/28/2017, 11/18/2016, 04/24/2014, 05/28/2013 FINDINGS: Lower chest: No acute  abnormality. Bandlike scarring and or atelectasis of the bilateral lung bases. Coronary artery calcifications. Hepatobiliary: No solid liver abnormality is seen. No gallstones, gallbladder wall thickening, or biliary dilatation. Pancreas: Unremarkable. No pancreatic ductal dilatation or surrounding inflammatory changes. Spleen: Normal in size without significant abnormality. Adrenals/Urinary Tract: Adrenal glands are unremarkable. Very atrophic appearance of the kidneys with small, generally low-attenuation renal lesions. There is a redemonstrated lobulated lesion of the superior pole of the right kidney measuring approximately 2.8 x 2.0 cm, of intermediate attenuation, HU = 28, and at most very minimally increased in size in comparison to multiple prior examinations dating back to 05/29/2013 (series 2, image 17). Tiny vascular calcifications and or nonobstructive renal calculi. No hydronephrosis. Bladder is unremarkable. Stomach/Bowel: Stomach is within normal limits. Status post terminal ileocolectomy and reanastomosis. No evidence of bowel wall thickening, distention, or inflammatory changes. Pancolonic diverticulosis. Vascular/Lymphatic: Aortic atherosclerosis. No enlarged abdominal or pelvic lymph nodes. Reproductive: Status post hysterectomy. Other: No abdominal wall hernia or abnormality. No abdominopelvic ascites. Musculoskeletal: No acute or significant osseous findings. IMPRESSION: 1. Very atrophic appearance of the kidneys in keeping with dialysis dependent end-stage renal disease with small, generally low-attenuation renal lesions present bilaterally and not significantly changed compared to prior examination. There is a redemonstrated lobulated lesion of the superior pole of the right kidney measuring approximately 2.8 x 2.0 cm, of intermediate attenuation, HU = 28, and at most minimally increased in size in comparison to multiple prior examinations dating back to 2014. Findings are most consistent with  a hemorrhagic  or proteinaceous cyst. No further routine follow-up is required. 2. Status post terminal ileocolectomy and reanastomosis. 3. Pancolonic diverticulosis without evidence of acute diverticulitis. 4. Coronary artery disease.  Aortic Atherosclerosis (ICD10-I70.0). Electronically Signed   By: Eddie Candle M.D.   On: 04/07/2020 11:47     Assessment/Plan 1. Complication of vascular access for dialysis, sequela Recommend:  The patient is doing well and currently has adequate dialysis access. The patient's dialysis center is not reporting any access issues. Flow pattern is stable when compared to the prior ultrasound.  The patient should have a duplex ultrasound of the dialysis access in 6 months. The patient will follow-up with me in the office after each ultrasound    - VAS [place] (AVF, AVG); Future  2. ESRD on hemodialysis ([place]) At the present time the patient has adequate dialysis access.  Continue hemodialysis as ordered without interruption.  Avoid nephrotoxic medications and dehydration.  Further plans per nephrology  3. AF (paroxysmal atrial fibrillation) (HCC) Continue antiarrhythmia medications as already ordered, these medications have been reviewed and there are no changes at this time.  Continue anticoagulation as ordered by Cardiology Service   4. Essential hypertension Continue antihypertensive medications as already ordered, these medications have been reviewed and there are no changes at this time.   5. Mixed hyperlipidemia Continue statin as ordered and reviewed, no changes at this time    Hortencia Pilar, MD  04/16/2020 1:08 PM

## 2020-04-30 ENCOUNTER — Other Ambulatory Visit: Payer: Self-pay

## 2020-04-30 ENCOUNTER — Ambulatory Visit (INDEPENDENT_AMBULATORY_CARE_PROVIDER_SITE_OTHER): Payer: Medicare HMO | Admitting: Internal Medicine

## 2020-04-30 ENCOUNTER — Ambulatory Visit (HOSPITAL_COMMUNITY)
Admission: RE | Admit: 2020-04-30 | Discharge: 2020-04-30 | Disposition: A | Discharge status: Home / Self Care | Payer: Medicare HMO | Source: Ambulatory Visit | Attending: Internal Medicine | Admitting: Internal Medicine

## 2020-04-30 ENCOUNTER — Encounter: Payer: Self-pay | Admitting: Internal Medicine

## 2020-04-30 DIAGNOSIS — R0600 Dyspnea, unspecified: Secondary | ICD-10-CM | POA: Diagnosis present

## 2020-04-30 DIAGNOSIS — R0609 Other forms of dyspnea: Secondary | ICD-10-CM

## 2020-04-30 IMAGING — DX DG CHEST 2V
2 series · 2 of 2 positions shown · non-contrast
Comparison: [DATE]

CLINICAL DATA: Shortness of breath

EXAM:
CHEST - 2 VIEW

[chest pa]
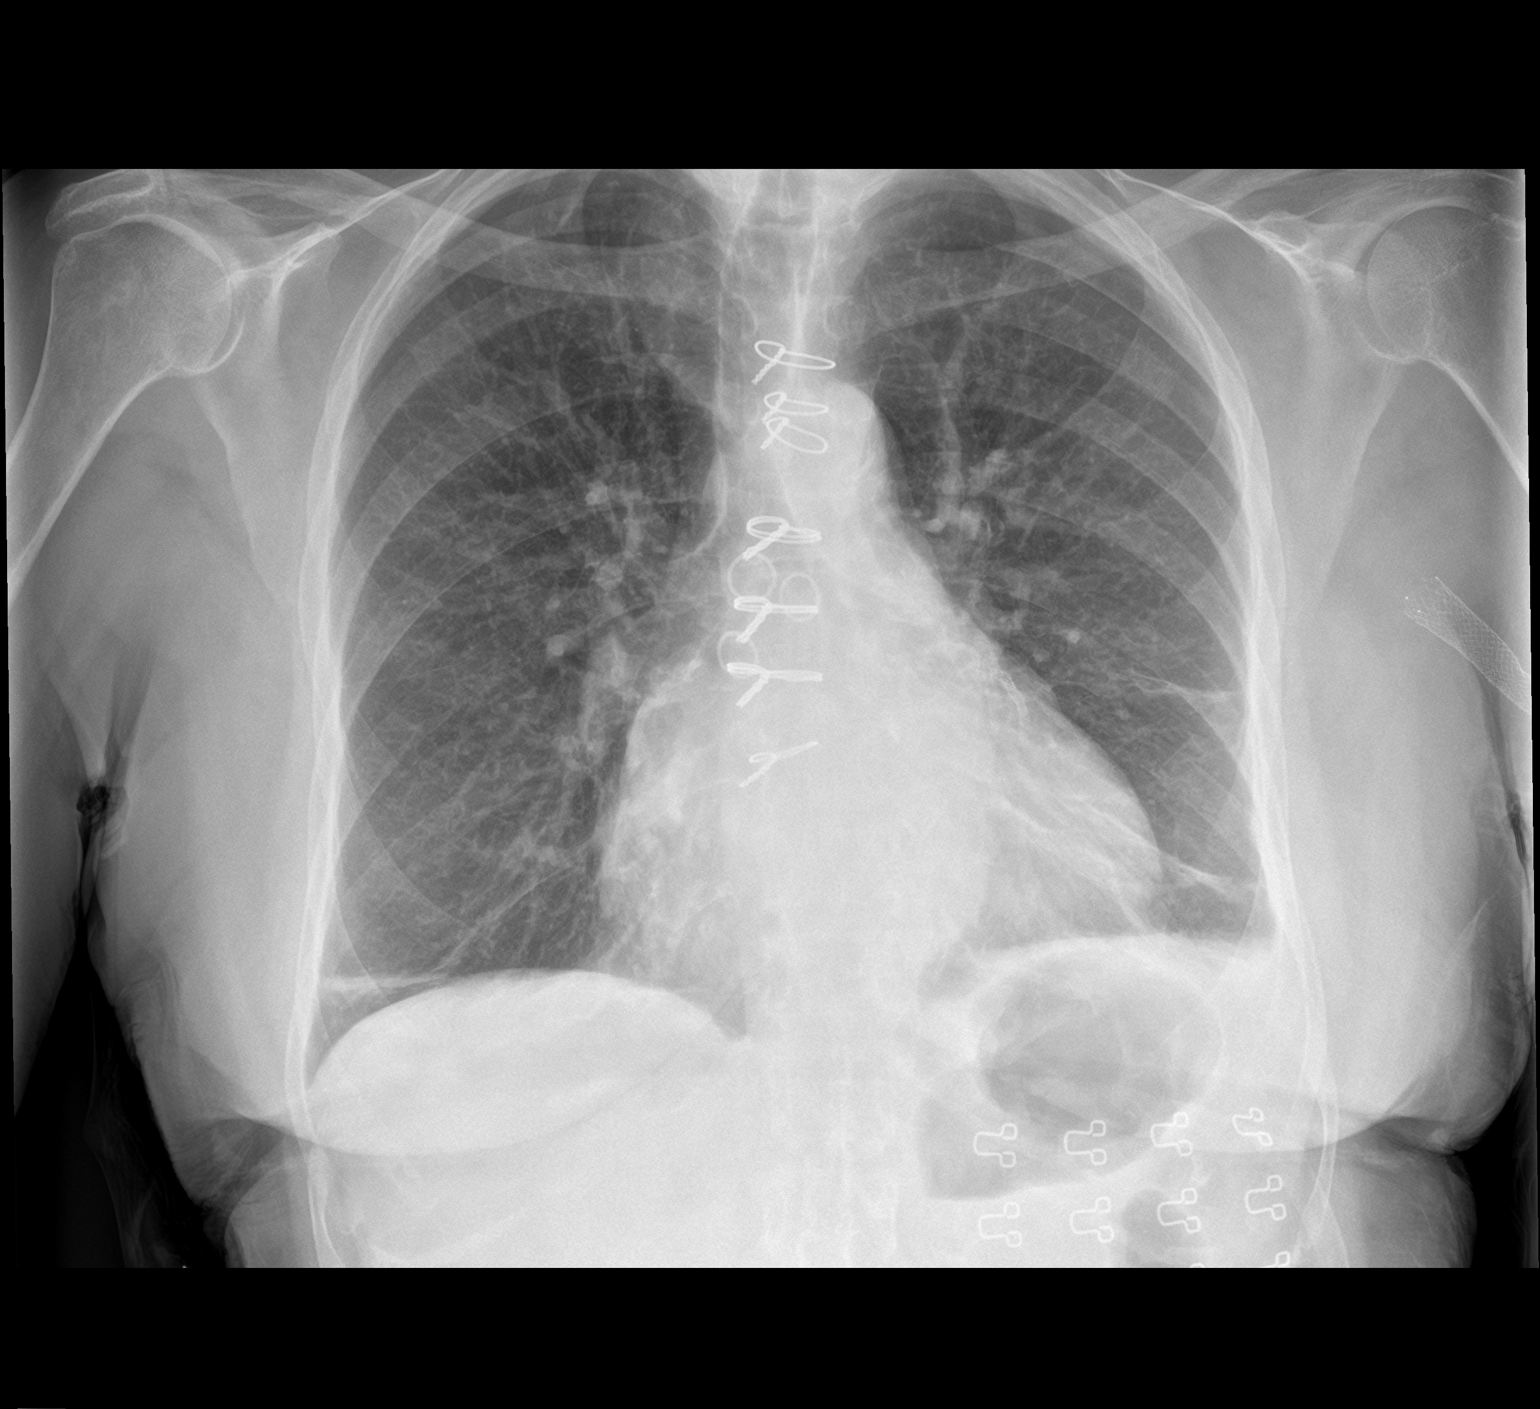

[chest lat]
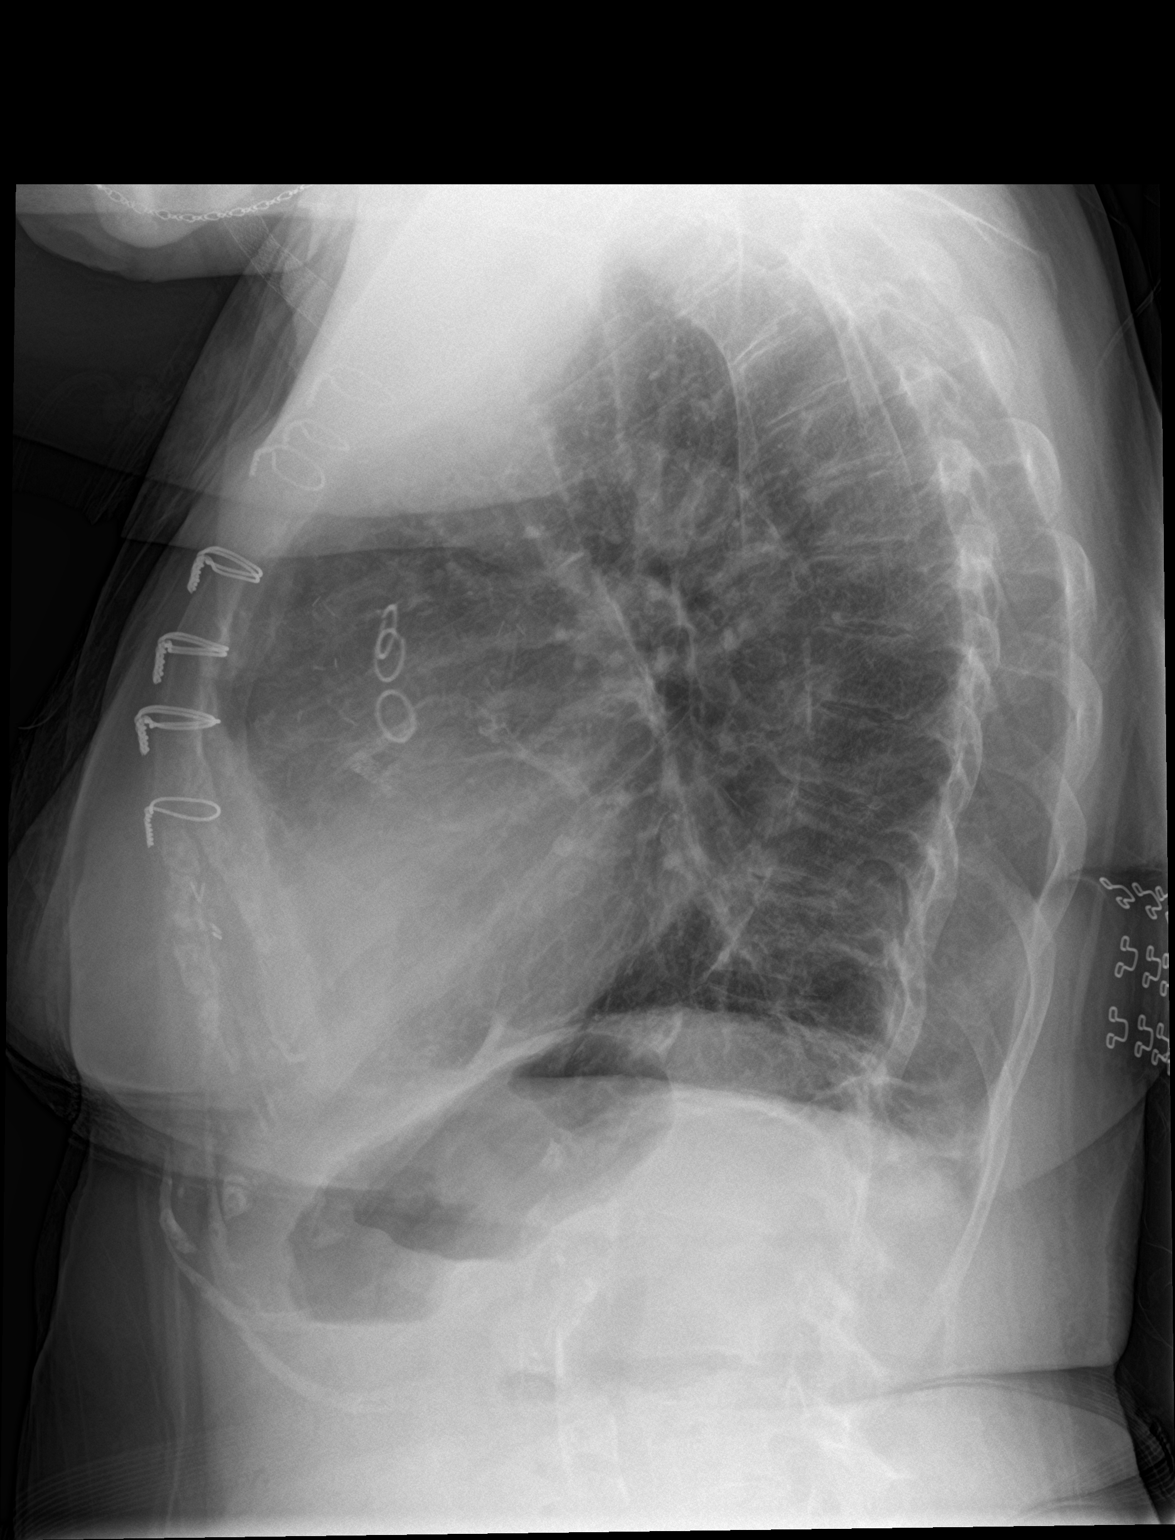

[2 of 2 positions shown; findings below may reference images not displayed]

FINDINGS: Prior CABG. Heart is upper limits normal in size. Linear areas of
scarring or atelectasis in the lung bases. No effusions. No acute
bony abnormality. Aortic atherosclerosis.
IMPRESSION: Bibasilar scarring or atelectasis.

Aortic atherosclerosis.

No active disease.

## 2020-04-30 NOTE — Progress Notes (Signed)
Shawna [name], female    DOB: 06-15-1941, 79 y.o.   MRN: [id number]   Brief patient profile:  23 yobf never smoker on HD since around 2011  maint on amiodarone first dosed 09/2015  and more limited by fatigue/ weak legs than sob chronically referred to pulmonary clinic in [facility]  04/30/2020 by Melina Copa with abnormal pfts  11/28/19 s baseline data.    History of Present Illness  04/30/2020  Pulmonary/ 1st office eval/ Chord Takahashi / [facility]  Chief Complaint  Patient presents with  . Pulmonary Consult    Referred by Melina Copa, PA. Pt states referred b/c she in on amiodarone and started taking this 2-3 years ago.  She c/o SOB occ when she lies down.   Dyspnea:  More weak than doe/  More sob on Sunday nights  Cough: bloody mucus 04/26/20 x one episode only while on just asa s h/o epistaxis Sleep: flat bed / 2 pillows  SABA use: has neb not using much at all doesn't help when does use it   No obvious day to day or daytime variability or assoc excess/ purulent sputum or mucus plugs or ongoing  hemoptysis or cp or chest tightness, subjective wheeze or overt sinus or hb symptoms.   Sleeping  without nocturnal  or early am exacerbation  of respiratory  c/o's or need for noct saba. Also denies any obvious fluctuation of symptoms with weather or environmental changes or other aggravating or alleviating factors except as outlined above   No unusual exposure hx or h/o childhood pna/ asthma or knowledge of premature birth.  Current Allergies, Complete Past Medical History, Past Surgical History, Family History, and Social History were reviewed in [employer] record.  ROS  The following are not active complaints unless bolded Hoarseness, sore throat, dysphagia, dental problems, itching, sneezing,  nasal congestion or discharge of excess mucus or purulent secretions, ear ache,   fever, chills, sweats, unintended wt loss or wt gain, classically pleuritic or exertional cp,   orthopnea pnd or arm/hand swelling  or leg swelling, presyncope, palpitations, abdominal pain, anorexia, nausea, vomiting, diarrhea  or change in bowel habits or change in bladder habits, change in stools or change in urine, dysuria, hematuria,  rash, arthralgias, visual complaints, headache, numbness, weakness or ataxia or problems with walking or coordination,  change in mood or  memory.           Past Medical History:  Diagnosis Date  . Acute on chronic respiratory failure with hypoxia ([place]) 10/10/2016  . Anxiety   . Arthritis   . AVM (arteriovenous malformation) of colon   . CAD (coronary artery disease)    a. s/p CABG in 2013 b. DES to D1 in 10/2016. c. cath in 07/2018 showing patent grafts with occlusion of D1 at prior stent site and progression of PDA disease --> medical management recommended  . Carotid artery disease ([place])    a. 91-63% LICA, 02/4664   . Chronic anemia   . Chronic bronchitis ([place])   . Chronic diastolic CHF (congestive heart failure) ([place])    a. 02/2012 Echo EF 60-65%, nl wall motion, Gr 1 DD, mod MR  . Colon cancer ([place]) 1992  . Esophageal stricture   . ESRD on hemodialysis ([place])    ESRD due to HTN, started dialysis 2011 and gets HD at [facility] with Dr Hinda Lenis on MWF schedule.  Access is LUA AVF as of Sept 2014.   Marland Kitchen GERD (gastroesophageal reflux disease)   .  High cholesterol 12/2011  . History of blood transfusion 07/2011; 12/2011; 01/2012 X 2; 04/2012  . History of gout   . History of lower GI bleeding   . Hypertension   . Iron deficiency anemia   . Mitral regurgitation    a. Moderate by echo, 02/2012  . Myocardial infarction ([place])   . NSVT (nonsustained ventricular tachycardia) ([place])   . Ovarian cancer ([place]) 1992  . PAF (paroxysmal atrial fibrillation) ([place])   . Pneumonia ~ 2009  . PUD (peptic ulcer disease)   . TIA (transient ischemic attack)     Outpatient Medications Prior to Visit  Medication Sig Dispense Refill  . amiodarone (PACERONE) 200 MG  tablet Take 1 tablet (200 mg total) by mouth daily. Starting July 2 90 tablet 3  . aspirin EC 81 MG tablet Take 1 tablet (81 mg total) by mouth daily. Restart on 03/13/19    . dicyclomine (BENTYL) 10 MG capsule Take 10 mg by mouth 4 (four) times daily -  before meals and at bedtime.    . diphenhydrAMINE (BENADRYL ALLERGY) 25 MG tablet Take 1 tablet (25 mg total) by mouth once for 1 dose. Take 1 tablet 1 hour prior to scheduled CT 30 tablet 0  . fluticasone (FLONASE) 50 MCG/ACT nasal spray Place 1 spray at bedtime as needed into both nostrils for allergies.     Marland Kitchen gabapentin (NEURONTIN) 100 MG capsule     . isosorbide mononitrate (IMDUR) 60 MG 24 hr tablet Take 1 tablet (60 mg total) by mouth 2 (two) times daily. 60 tablet 6  . lidocaine-prilocaine (EMLA) cream Apply 1 application topically every Monday, Wednesday, and Friday. Prior to dialysis    . loratadine (CLARITIN) 10 MG tablet Take 1 tablet (10 mg total) by mouth daily as needed for allergies. 30 tablet 0  . multivitamin (RENA-VIT) TABS tablet Take 1 tablet by mouth daily.    . nitroGLYCERIN (NITROSTAT) 0.4 MG SL tablet DISSOLVE ONE TABLET UNDER THE TONGUE EVERY 5 MINUTES AS NEEDED FOR CHEST PAIN.  DO NOT EXCEED A TOTAL OF 3 DOSES IN 15 MINUTES 30 tablet 2  . nystatin cream (MYCOSTATIN) Apply topically 2 (two) times daily.    Marland Kitchen nystatin-triamcinolone (MYCOLOG II) cream Apply 1 application topically 2 (two) times daily. 30 g 0  . omeprazole (PRILOSEC) 40 MG capsule Take 1 capsule (40 mg total) by mouth daily before breakfast. 30 capsule 5  . ondansetron (ZOFRAN-ODT) 4 MG disintegrating tablet Take 4 mg by mouth every 8 (eight) hours as needed for nausea or vomiting.     . simvastatin (ZOCOR) 20 MG tablet TAKE 1 TABLET BY MOUTH ONCE DAILY AT BEDTIME (Patient taking differently: Take 20 mg by mouth at bedtime. ) 90 tablet 2  . triamcinolone cream (KENALOG) 0.1 % Apply 1 application topically daily as needed (Dry skin/ itching).     . cyclobenzaprine  (FLEXERIL) 5 MG tablet Take 5 mg by mouth 3 (three) times daily as needed for muscle spasms.    Marland Kitchen dicyclomine (BENTYL) 10 MG capsule Take 1 capsule (10 mg total) by mouth 2 (two) times daily before a meal. 60 capsule 5  . predniSONE (DELTASONE) 20 MG tablet Took it for R knee     . sevelamer carbonate (RENVELA) 800 MG tablet Take 1,600-2,400 mg by mouth 3 (three) times daily with meals. Takes 3 tablets with meals and 2 tablets with snacks     No facility-administered medications prior to visit.     Objective:  BP (!) 118/50 (BP Location: Left Arm)   Pulse 70   Temp (!) 97.4 F (36.3 C) (Temporal)   SpO2 99% Comment: on RA  SpO2: 99 % (on RA)   W/c bound elderly bf nad - has 4 pronged cane  Trace ankle edema   HEENT : pt wearing mask not removed for exam due to covid -19 concerns.    NECK :  without JVD/Nodes/TM/ nl carotid upstrokes bilaterally   LUNGS: no acc muscle use,  Nl contour chest which is clear to A and P bilaterally without cough on insp or exp maneuvers   CV:  RRR  no s3 or murmur or increase in P2, and trace bilaterally sym ankle edema   ABD:  soft and nontender with nl inspiratory excursion in the supine position. No bruits or organomegaly appreciated, bowel sounds nl  MS:  Nl gait/ ext warm without deformities, calf tenderness, cyanosis or clubbing No obvious joint restrictions   SKIN: warm and dry without lesions    NEURO:  alert, approp, nl sensorium with  no motor or cerebellar deficits apparent.      CXR PA and Lateral:   04/30/2020 :    I personally reviewed images and   impression as follows:   Mild / mod t kyphosis s ild            Assessment   No problem-specific Assessment & Plan notes found for this encounter.     Christinia Gully, MD 04/30/2020

## 2020-04-30 NOTE — Assessment & Plan Note (Addendum)
Onset summer 2021  -   maint on amiodarone first dosed 09/2015 -  PFT's  12/17/19   FEV1 .85  ( 64% ) ratio 0.79  with DLCO  4.82 (28%) corrects to 3.27 (78%)  for alv volume  And minimal concavity  to f/v loop  -   04/30/2020   Walked RA  approx   100 ft  @ walker/ slow pace  stopped due to  Leg fatigue with sats still 100%    She doe not have significant findings on cxr and pfts though suggestive of emphysema are not dx of any significant airflow obstruction / esp emphysematous and no ILD evident by walk despite ? Spurious DLCO.   Patients typically have been on amiodarone for 6-12 months before this complication manifests.  Of note, serial clinical evaluation for symptoms such as cough dyspnea or fevers is  the preferred method of monitoring for pulmonary toxicity because a decrease in DLCO or lung volumes is a nonspecific for toxicity. Pathologically amiodarone pulmonary toxicity may appear as interstitial pneumonitis, eosinophilic pneumonia, organizing pneumonia, pulmonary fibrosis or less commonly as diffuse alveolar hemorrhage, pulmonary nodules or pleural effusions.  Risk factors for pulmonary toxicity include age greater than 66, daily dose greater than equal to 400 mg, a ? high cumulative dose, or pre-existing lung disease    She has 1-2 risk factors with abnormal dlco but technically difficult study - with sats 100% at end of walk, albeit a short one, unlikely this dlco is correct and best way to follow = serial sats at peak exertion and if trending down would give early and best indication to try alternative to amiodarone .   In the meantime she has noted sob on Sunday nights p missing one day's HD so probably does tend to have mild vol overload contributing to her symptoms so should keep on as dry as status as tolerated going forward.   Pulmonary f/u is prn           Each maintenance medication was reviewed in detail including emphasizing most importantly the difference between  maintenance and prns and under what circumstances the prns are to be triggered using an action plan format where appropriate.  Total time for H and P, chart review, counseling,  directly observing portions of ambulatory 02 saturation study/  and generating customized AVS unique to this office visit / charting > 60 min

## 2020-04-30 NOTE — Patient Instructions (Signed)
We need to continue to keep your fluid down as much as you can tolerate  Make sure you check your oxygen saturations at highest level of activity to be sure it stays over 90% and keep track of it at least once a week, more often if breathing getting worse, and let me know if losing ground.   Please remember to go to the  x-ray department  @  [facility] for your tests - we will call you with the results when they are available      Pulmonary follow up is needed

## 2020-05-05 NOTE — Progress Notes (Signed)
Patient returned phone call and writer went over xray results per Dr Melvyn Novas. All questions answered and patient expressed full understanding. Nothing further needed at this time.

## 2020-05-06 ENCOUNTER — Observation Stay (HOSPITAL_COMMUNITY)
Admission: EM | Admit: 2020-05-06 | Discharge: 2020-05-07 | Disposition: A | Discharge status: Home / Self Care | Payer: Medicare HMO | Attending: Family Medicine | Admitting: Family Medicine

## 2020-05-06 ENCOUNTER — Encounter (HOSPITAL_COMMUNITY): Payer: Self-pay

## 2020-05-06 ENCOUNTER — Other Ambulatory Visit: Payer: Self-pay

## 2020-05-06 DIAGNOSIS — Z951 Presence of aortocoronary bypass graft: Secondary | ICD-10-CM | POA: Diagnosis not present

## 2020-05-06 DIAGNOSIS — K219 Gastro-esophageal reflux disease without esophagitis: Secondary | ICD-10-CM | POA: Diagnosis present

## 2020-05-06 DIAGNOSIS — Z85038 Personal history of other malignant neoplasm of large intestine: Secondary | ICD-10-CM | POA: Diagnosis not present

## 2020-05-06 DIAGNOSIS — E785 Hyperlipidemia, unspecified: Secondary | ICD-10-CM | POA: Diagnosis present

## 2020-05-06 DIAGNOSIS — I480 Paroxysmal atrial fibrillation: Secondary | ICD-10-CM | POA: Diagnosis present

## 2020-05-06 DIAGNOSIS — I10 Essential (primary) hypertension: Secondary | ICD-10-CM | POA: Diagnosis present

## 2020-05-06 DIAGNOSIS — Z79899 Other long term (current) drug therapy: Secondary | ICD-10-CM | POA: Diagnosis not present

## 2020-05-06 DIAGNOSIS — N2581 Secondary hyperparathyroidism of renal origin: Secondary | ICD-10-CM | POA: Diagnosis present

## 2020-05-06 DIAGNOSIS — N186 End stage renal disease: Secondary | ICD-10-CM | POA: Insufficient documentation

## 2020-05-06 DIAGNOSIS — R0609 Other forms of dyspnea: Secondary | ICD-10-CM | POA: Diagnosis present

## 2020-05-06 DIAGNOSIS — D649 Anemia, unspecified: Secondary | ICD-10-CM | POA: Diagnosis not present

## 2020-05-06 DIAGNOSIS — K589 Irritable bowel syndrome without diarrhea: Secondary | ICD-10-CM | POA: Diagnosis present

## 2020-05-06 DIAGNOSIS — Z23 Encounter for immunization: Secondary | ICD-10-CM | POA: Insufficient documentation

## 2020-05-06 DIAGNOSIS — Z992 Dependence on renal dialysis: Secondary | ICD-10-CM

## 2020-05-06 DIAGNOSIS — Z20822 Contact with and (suspected) exposure to covid-19: Secondary | ICD-10-CM | POA: Diagnosis not present

## 2020-05-06 DIAGNOSIS — D508 Other iron deficiency anemias: Secondary | ICD-10-CM

## 2020-05-06 DIAGNOSIS — I5022 Chronic systolic (congestive) heart failure: Secondary | ICD-10-CM | POA: Diagnosis present

## 2020-05-06 DIAGNOSIS — E782 Mixed hyperlipidemia: Secondary | ICD-10-CM | POA: Diagnosis present

## 2020-05-06 DIAGNOSIS — I2510 Atherosclerotic heart disease of native coronary artery without angina pectoris: Secondary | ICD-10-CM | POA: Diagnosis not present

## 2020-05-06 DIAGNOSIS — R531 Weakness: Secondary | ICD-10-CM | POA: Diagnosis present

## 2020-05-06 DIAGNOSIS — I132 Hypertensive heart and chronic kidney disease with heart failure and with stage 5 chronic kidney disease, or end stage renal disease: Secondary | ICD-10-CM | POA: Diagnosis not present

## 2020-05-06 DIAGNOSIS — I5042 Chronic combined systolic (congestive) and diastolic (congestive) heart failure: Secondary | ICD-10-CM | POA: Diagnosis present

## 2020-05-06 DIAGNOSIS — I200 Unstable angina: Secondary | ICD-10-CM | POA: Diagnosis present

## 2020-05-06 DIAGNOSIS — R0600 Dyspnea, unspecified: Secondary | ICD-10-CM

## 2020-05-06 DIAGNOSIS — I5032 Chronic diastolic (congestive) heart failure: Secondary | ICD-10-CM | POA: Insufficient documentation

## 2020-05-06 DIAGNOSIS — D509 Iron deficiency anemia, unspecified: Secondary | ICD-10-CM | POA: Diagnosis present

## 2020-05-06 DIAGNOSIS — Z7982 Long term (current) use of aspirin: Secondary | ICD-10-CM | POA: Diagnosis not present

## 2020-05-06 DIAGNOSIS — D631 Anemia in chronic kidney disease: Secondary | ICD-10-CM | POA: Diagnosis present

## 2020-05-06 DIAGNOSIS — I248 Other forms of acute ischemic heart disease: Secondary | ICD-10-CM | POA: Diagnosis present

## 2020-05-06 DIAGNOSIS — I2489 Other forms of acute ischemic heart disease: Secondary | ICD-10-CM | POA: Diagnosis present

## 2020-05-06 LAB — COMPREHENSIVE METABOLIC PANEL
ALT: 8 U/L (ref 0–44)
AST: 5 U/L — ABNORMAL LOW (ref 15–41)
Albumin: 3 g/dL — ABNORMAL LOW (ref 3.5–5.0)
Alkaline Phosphatase: 87 U/L (ref 38–126)
Anion gap: 16 — ABNORMAL HIGH (ref 5–15)
BUN: 50 mg/dL — ABNORMAL HIGH (ref 8–23)
CO2: 24 mmol/L (ref 22–32)
Calcium: 7.3 mg/dL — ABNORMAL LOW (ref 8.9–10.3)
Chloride: 102 mmol/L (ref 98–111)
Creatinine, Ser: 7.85 mg/dL — ABNORMAL HIGH (ref 0.44–1.00)
GFR, Estimated: 5 mL/min — ABNORMAL LOW (ref >60)
Glucose, Bld: 100 mg/dL — ABNORMAL HIGH (ref 70–99)
Potassium: 4.4 mmol/L (ref 3.5–5.1)
Sodium: 142 mmol/L (ref 135–145)
Total Bilirubin: 0.4 mg/dL (ref 0.3–1.2)
Total Protein: 6.4 g/dL — ABNORMAL LOW (ref 6.5–8.1)

## 2020-05-06 LAB — CBC WITH DIFFERENTIAL/PLATELET
Abs Immature Granulocytes: 0.05 10*3/uL (ref 0.00–0.07)
Basophils Absolute: 0.1 10*3/uL (ref 0.0–0.1)
Basophils Relative: 1 %
Eosinophils Absolute: 0.3 10*3/uL (ref 0.0–0.5)
Eosinophils Relative: 5 %
HCT: 22.8 % — ABNORMAL LOW (ref 36.0–46.0)
Hemoglobin: 6.9 g/dL — CL (ref 12.0–15.0)
Immature Granulocytes: 1 %
Lymphocytes Relative: 12 %
Lymphs Abs: 0.8 10*3/uL (ref 0.7–4.0)
MCH: 36.5 pg — ABNORMAL HIGH (ref 26.0–34.0)
MCHC: 30.3 g/dL (ref 30.0–36.0)
MCV: 120.6 fL — ABNORMAL HIGH (ref 80.0–100.0)
Monocytes Absolute: 0.5 10*3/uL (ref 0.1–1.0)
Monocytes Relative: 8 %
Neutro Abs: 4.6 10*3/uL (ref 1.7–7.7)
Neutrophils Relative %: 73 %
Platelets: 297 10*3/uL (ref 150–400)
RBC: 1.89 MIL/uL — ABNORMAL LOW (ref 3.87–5.11)
RDW: 19.5 % — ABNORMAL HIGH (ref 11.5–15.5)
WBC: 6.3 10*3/uL (ref 4.0–10.5)
nRBC: 0.3 % — ABNORMAL HIGH (ref 0.0–0.2)

## 2020-05-06 LAB — RESPIRATORY PANEL BY RT PCR (FLU A&B, COVID)
Influenza A by PCR: NEGATIVE
Influenza B by PCR: NEGATIVE
SARS Coronavirus 2 by RT PCR: NEGATIVE

## 2020-05-06 LAB — RETICULOCYTES
Immature Retic Fract: 36.2 % — ABNORMAL HIGH (ref 2.3–15.9)
RBC.: 1.93 MIL/uL — ABNORMAL LOW (ref 3.87–5.11)
Retic Count, Absolute: 107.1 10*3/uL (ref 19.0–186.0)
Retic Ct Pct: 5.6 % — ABNORMAL HIGH (ref 0.4–3.1)

## 2020-05-06 LAB — VITAMIN B12: Vitamin B-12: 294 pg/mL (ref 180–914)

## 2020-05-06 LAB — IRON AND TIBC
Iron: 68 ug/dL (ref 28–170)
Saturation Ratios: 32 % — ABNORMAL HIGH (ref 10.4–31.8)
TIBC: 215 ug/dL — ABNORMAL LOW (ref 250–450)
UIBC: 147 ug/dL

## 2020-05-06 LAB — PREPARE RBC (CROSSMATCH)

## 2020-05-06 LAB — FERRITIN: Ferritin: 651 ng/mL — ABNORMAL HIGH (ref 11–307)

## 2020-05-06 LAB — FOLATE: Folate: 37 ng/mL (ref >5.9)

## 2020-05-06 LAB — POC OCCULT BLOOD, ED: Fecal Occult Bld: NEGATIVE

## 2020-05-06 MED ORDER — RENA-VITE PO TABS
1.0000 | ORAL_TABLET | Freq: Every day | ORAL | Status: DC
Start: 2020-05-07 — End: 2020-05-07
  Administered 2020-05-07: 1 via ORAL
  Filled 2020-05-06: qty 1

## 2020-05-06 MED ORDER — LIDOCAINE HCL (PF) 1 % IJ SOLN: 5.0000 mL | INTRAMUSCULAR | Status: DC | PRN

## 2020-05-06 MED ORDER — ASPIRIN EC 81 MG PO TBEC
81.0000 mg | DELAYED_RELEASE_TABLET | Freq: Every day | ORAL | Status: DC
  Administered 2020-05-07: 81 mg via ORAL
  Filled 2020-05-06: qty 1

## 2020-05-06 MED ORDER — AMIODARONE HCL 200 MG PO TABS
200.0000 mg | ORAL_TABLET | Freq: Every day | ORAL | Status: DC
  Administered 2020-05-07: 200 mg via ORAL
  Filled 2020-05-06: qty 1

## 2020-05-06 MED ORDER — SODIUM CHLORIDE 0.9 % IV SOLN: 100.0000 mL | INTRAVENOUS | Status: DC | PRN

## 2020-05-06 MED ORDER — CHLORHEXIDINE GLUCONATE CLOTH 2 % EX PADS
6.0000 | MEDICATED_PAD | Freq: Every day | CUTANEOUS | Status: DC
  Administered 2020-05-07: 6 via TOPICAL

## 2020-05-06 MED ORDER — SODIUM CHLORIDE 0.9% IV SOLUTION: Freq: Once | INTRAVENOUS | Status: DC

## 2020-05-06 MED ORDER — PANTOPRAZOLE SODIUM 40 MG PO TBEC
40.0000 mg | DELAYED_RELEASE_TABLET | Freq: Every day | ORAL | Status: DC
Start: 2020-05-07 — End: 2020-05-07
  Administered 2020-05-07: 40 mg via ORAL
  Filled 2020-05-06: qty 1

## 2020-05-06 MED ORDER — LIDOCAINE-PRILOCAINE 2.5-2.5 % EX CREA: 1.0000 "application " | TOPICAL_CREAM | CUTANEOUS | Status: DC | PRN

## 2020-05-06 MED ORDER — NITROGLYCERIN 0.4 MG SL SUBL: 0.4000 mg | SUBLINGUAL_TABLET | SUBLINGUAL | Status: DC | PRN

## 2020-05-06 MED ORDER — GABAPENTIN 100 MG PO CAPS
100.0000 mg | ORAL_CAPSULE | Freq: Every day | ORAL | Status: DC
  Administered 2020-05-07: 100 mg via ORAL
  Filled 2020-05-06: qty 1

## 2020-05-06 MED ORDER — SIMVASTATIN 20 MG PO TABS: 20.0000 mg | ORAL_TABLET | Freq: Every day | ORAL | Status: DC

## 2020-05-06 MED ORDER — ONDANSETRON HCL 4 MG PO TABS: 4.0000 mg | ORAL_TABLET | Freq: Four times a day (QID) | ORAL | Status: DC | PRN

## 2020-05-06 MED ORDER — PNEUMOCOCCAL VAC POLYVALENT 25 MCG/0.5ML IJ INJ
0.5000 mL | INJECTION | INTRAMUSCULAR | Status: AC
  Administered 2020-05-07: 0.5 mL via INTRAMUSCULAR
  Filled 2020-05-06: qty 0.5

## 2020-05-06 MED ORDER — FLUTICASONE PROPIONATE 50 MCG/ACT NA SUSP
1.0000 | Freq: Every evening | NASAL | Status: DC | PRN
Start: 2020-05-06 — End: 2020-05-07

## 2020-05-06 MED ORDER — ISOSORBIDE MONONITRATE ER 60 MG PO TB24
60.0000 mg | ORAL_TABLET | Freq: Two times a day (BID) | ORAL | Status: DC
  Administered 2020-05-07: 60 mg via ORAL
  Filled 2020-05-06 (×2): qty 1

## 2020-05-06 MED ORDER — DICYCLOMINE HCL 10 MG PO CAPS
10.0000 mg | ORAL_CAPSULE | Freq: Three times a day (TID) | ORAL | Status: DC
  Administered 2020-05-07 (×2): 10 mg via ORAL
  Filled 2020-05-06 (×3): qty 1

## 2020-05-06 MED ORDER — FENTANYL CITRATE (PF) 100 MCG/2ML IJ SOLN: 12.5000 ug | INTRAMUSCULAR | Status: DC | PRN

## 2020-05-06 MED ORDER — ONDANSETRON HCL 4 MG/2ML IJ SOLN: 4.0000 mg | Freq: Four times a day (QID) | INTRAMUSCULAR | Status: DC | PRN

## 2020-05-06 MED ORDER — ALPRAZOLAM 0.5 MG PO TABS: 0.5000 mg | ORAL_TABLET | Freq: Every evening | ORAL | Status: DC | PRN

## 2020-05-06 MED ORDER — METOPROLOL TARTRATE 5 MG/5ML IV SOLN: 5.0000 mg | Freq: Four times a day (QID) | INTRAVENOUS | Status: DC | PRN

## 2020-05-06 MED ORDER — PENTAFLUOROPROP-TETRAFLUOROETH EX AERO: 1.0000 "application " | INHALATION_SPRAY | CUTANEOUS | Status: DC | PRN

## 2020-05-06 MED ORDER — LORATADINE 10 MG PO TABS: 10.0000 mg | ORAL_TABLET | Freq: Every day | ORAL | Status: DC | PRN

## 2020-05-06 NOTE — ED Triage Notes (Signed)
Pt to er, pt states that her md office called her today and told her that her blood count was 4, pt c/o general weakness and fatigue, pt is pale.  Denies black tarry stools,

## 2020-05-06 NOTE — ED Notes (Signed)
Date and time results received: 05/06/20 2:05 PM   (use smartphrase ".now" to insert current time)  Test: Hgb Critical Value: 6.9  Name of Provider Notified: Pati Gallo, PA  Orders Received? Or Actions Taken?: yes

## 2020-05-06 NOTE — H&P (Addendum)
History and Physical  [place] QQI:[id number] DOB: 1941-06-28 DOA: 05/06/2020  PCP: [name], MD  Patient coming from: Home   I have personally briefly reviewed patient's old medical records in [place]  Chief Complaint: Weakness / abnormal lab test  HPI: Shawna [name] is a 79 y.o. female with medical history significant for chronic GI bleeding secondary to small bowel angiodysplasia as well as colonic polyps who is followed closely by GI and only on aspirin for anticoagulation, ESRD on HD MWF, CAD s/p CABG, unstable angina, multiple hospitalizations for CHF and volume overload, has not produced urine in the past 4 to 5 years, was told by her physician to come to ED due to low blood count.  Pt had been complaining of generalized weakness and fatigue.  Pt denied blood in stool and denied black tarry stool.  She was told her Hg was down to 4 but when checked today it was 6.9.  She was ordered to receive 1 unit PRBC.  Pt missed hemodialysis today.  Pt has had multiple prior transfusions for anemia in the past.  Her anemia is thought to be multifactorial given her end stage renal disease and chronic blood loss and iron deficiency.  Her baseline hemoglobin has been around 9 from multiple prior tests.  She tested hemoccult (guaiac) negative today.   Given that she is having symptoms of anemia she is being transfused 1 unit PRBC and observed in the hospital.   She is denying chest pain symptoms at this time.  Her electrolytes are stable.  She is vaccinated and Sars 2 coronavirus test is negative.    Review of Systems: As per HPI otherwise 10 point review of systems negative.   Past Medical History:  Diagnosis Date  . Acute on chronic respiratory failure with hypoxia ([place]) 10/10/2016  . Anxiety   . Arthritis   . AVM (arteriovenous malformation) of colon   . CAD (coronary artery disease)    a. s/p CABG in 2013 b. DES to D1 in 10/2016. c. cath in 07/2018 showing patent  grafts with occlusion of D1 at prior stent site and progression of PDA disease --> medical management recommended  . Carotid artery disease ([place])    a. 94-17% LICA, 10/812   . Chronic anemia   . Chronic bronchitis ([place])   . Chronic diastolic CHF (congestive heart failure) ([place])    a. 02/2012 Echo EF 60-65%, nl wall motion, Gr 1 DD, mod MR  . Colon cancer ([place]) 1992  . Esophageal stricture   . ESRD on hemodialysis ([place])    ESRD due to HTN, started dialysis 2011 and gets HD at [facility] with Dr Hinda Lenis on MWF schedule.  Access is LUA AVF as of Sept 2014.   Mar[place] Kitchen GERD (gastroesophageal reflux disease)   . High cholesterol 12/2011  . History of blood transfusion 07/2011; 12/2011; 01/2012 X 2; 04/2012  . History of gout   . History of lower GI bleeding   . Hypertension   . Iron deficiency anemia   . Mitral regurgitation    a. Moderate by echo, 02/2012  . Myocardial infarction ([place])   . NSVT (nonsustained ventricular tachycardia) ([place])   . Ovarian cancer ([place]) 1992  . PAF (paroxysmal atrial fibrillation) ([place])   . Pneumonia ~ 2009  . PUD (peptic ulcer disease)   . TIA (transient ischemic attack)     Past Surgical History:  Procedure Laterality Date  . A/V SHUNTOGRAM Left  03/19/2019   Procedure: A/V SHUNTOGRAM;  Surgeon: Katha Cabal, MD;  Location: [facility] CV LAB;  Service: Cardiovascular;  Laterality: Left;  . ABDOMINAL HYSTERECTOMY  1992  . APPENDECTOMY  06/1990  . AV FISTULA PLACEMENT  07/2009   left upper arm  . AV FISTULA PLACEMENT Right 09/06/2016   Procedure: RIGHT FOREARM ARTERIOVENOUS (AV) GRAFT;  Surgeon: Elam Dutch, MD;  Location: [facility] OR;  Service: Vascular;  Laterality: Right;  . AV FISTULA PLACEMENT N/A 02/24/2017   Procedure: INSERTION OF ARTERIOVENOUS (AV) GORE-TEX GRAFT ARM (BRACHIAL AXILLARY);  Surgeon: Katha Cabal, MD;  Location: [facility] ORS;  Service: Vascular;  Laterality: N/A;  . [place] Right 09/06/2016   Procedure: REMOVAL OF Right Arm  ARTERIOVENOUS GORETEX GRAFT and Vein Patch angioplasty of brachial artery;  Surgeon: Angelia Mould, MD;  Location: [place];  Service: Vascular;  Laterality: Right;  . BIOPSY  09/26/2019   Procedure: BIOPSY;  Surgeon: Rogene Houston, MD;  [facility];  Service: Endoscopy;;  . COLON RESECTION  1992  . COLON SURGERY    . COLONOSCOPY N/A 03/09/2019   Procedure: COLONOSCOPY;  Surgeon: Rogene Houston, MD;  [facility];  Service: Endoscopy;  Laterality: N/A;  . CORONARY ANGIOPLASTY WITH STENT PLACEMENT  12/15/11   "2"  . CORONARY ANGIOPLASTY WITH STENT PLACEMENT  y/2013   "1; makes total of 3" (05/02/2012)  . CORONARY ARTERY BYPASS GRAFT  06/13/2012   Procedure: CORONARY ARTERY BYPASS GRAFTING (CABG);  Surgeon: Grace Isaac, MD;  Location: [place];  Service: Open Heart Surgery;  Laterality: N/A;  cabg x four;  using left internal mammary artery, and left leg greater saphenous vein harvested endoscopically  . CORONARY STENT INTERVENTION N/A 10/13/2016   Procedure: Coronary Stent Intervention;  Surgeon: Troy Sine, MD;  Location: [place] CV LAB;  Service: Cardiovascular;  Laterality: N/A;  . DIALYSIS/PERMA CATHETER REMOVAL N/A 04/18/2017   Procedure: DIALYSIS/PERMA CATHETER REMOVAL;  Surgeon: Katha Cabal, MD;  Location: [place] CV LAB;  Service: Cardiovascular;  Laterality: N/A;  . DILATION AND CURETTAGE OF UTERUS    . ESOPHAGOGASTRODUODENOSCOPY  01/20/2012   Procedure: ESOPHAGOGASTRODUODENOSCOPY (EGD);  Surgeon: Ladene Artist, MD,FACG;  Location: [facility] ENDOSCOPY;  Service: Endoscopy;  Laterality: N/A;  . ESOPHAGOGASTRODUODENOSCOPY N/A 03/26/2013   Procedure: ESOPHAGOGASTRODUODENOSCOPY (EGD);  Surgeon: Irene Shipper, MD;  Location: [facility] ENDOSCOPY;  Service: Endoscopy;  Laterality: N/A;  . ESOPHAGOGASTRODUODENOSCOPY N/A 04/30/2015   Procedure: ESOPHAGOGASTRODUODENOSCOPY (EGD);  Surgeon: Rogene Houston, MD;  [facility];  Service: Endoscopy;   Laterality: N/A;  1pm - moved to 10/20 @ 1:10  . ESOPHAGOGASTRODUODENOSCOPY N/A 07/29/2016   Procedure: ESOPHAGOGASTRODUODENOSCOPY (EGD);  Surgeon: Manus Gunning, MD;  Location: [place];  Service: Gastroenterology;  Laterality: N/A;  enteroscopy  . ESOPHAGOGASTRODUODENOSCOPY N/A 09/26/2019   Procedure: ESOPHAGOGASTRODUODENOSCOPY (EGD);  Surgeon: Rogene Houston, MD;  [facility];  Service: Endoscopy;  Laterality: N/A;  1250  . GIVENS CAPSULE STUDY N/A 03/07/2019   Procedure: GIVENS CAPSULE STUDY;  Surgeon: Rogene Houston, MD;  [facility];  Service: Endoscopy;  Laterality: N/A;  7:30  . INTRAOPERATIVE TRANSESOPHAGEAL ECHOCARDIOGRAM  06/13/2012   Procedure: INTRAOPERATIVE TRANSESOPHAGEAL ECHOCARDIOGRAM;  Surgeon: Grace Isaac, MD;  Location: [place];  Service: Open Heart Surgery;  Laterality: N/A;  . IR GENERIC HISTORICAL  07/26/2016   IR FLUORO GUIDE CV LINE RIGHT 07/26/2016 Greggory Keen, MD [facility]  . IR GENERIC HISTORICAL  07/26/2016   IR US GUIDE VASC ACCESS RIGHT 07/26/2016 Greggory Keen, MD [facility]  . IR GENERIC HISTORICAL  08/02/2016   IR US GUIDE VASC ACCESS RIGHT 08/02/2016 Greggory Keen, MD [facility]  . IR GENERIC HISTORICAL  08/02/2016   IR FLUORO GUIDE CV LINE RIGHT 08/02/2016 Greggory Keen, MD [facility]  . IR RADIOLOGY PERIPHERAL GUIDED IV START  03/28/2017  . IR US GUIDE VASC ACCESS RIGHT  03/28/2017  . LEFT HEART CATH AND CORONARY ANGIOGRAPHY N/A 09/20/2016   Procedure: Left Heart Cath and Coronary Angiography;  Surgeon: [name], MD;  Location: [place] CV LAB;  Service: Cardiovascular;  Laterality: N/A;  . LEFT HEART CATH AND CORS/GRAFTS ANGIOGRAPHY N/A 10/13/2016   Procedure: Left Heart Cath and Cors/Grafts Angiography;  Surgeon: Troy Sine, MD;  Location: [place] CV LAB;  Service: Cardiovascular;  Laterality: N/A;  . LEFT HEART CATH AND CORS/GRAFTS ANGIOGRAPHY N/A 07/13/2018   Procedure: LEFT HEART CATH AND  CORS/GRAFTS ANGIOGRAPHY;  Surgeon: Martinique, Peter M, MD;  Location: [place] CV LAB;  Service: Cardiovascular;  Laterality: N/A;  . LEFT HEART CATHETERIZATION WITH CORONARY ANGIOGRAM N/A 12/15/2011   Procedure: LEFT HEART CATHETERIZATION WITH CORONARY ANGIOGRAM;  Surgeon: Burnell Blanks, MD;  Location: [facility] CATH LAB;  Service: Cardiovascular;  Laterality: N/A;  . LEFT HEART CATHETERIZATION WITH CORONARY ANGIOGRAM N/A 01/10/2012   Procedure: LEFT HEART CATHETERIZATION WITH CORONARY ANGIOGRAM;  Surgeon: Peter M Martinique, MD;  Location: [facility] CATH LAB;  Service: Cardiovascular;  Laterality: N/A;  . LEFT HEART CATHETERIZATION WITH CORONARY ANGIOGRAM N/A 06/08/2012   Procedure: LEFT HEART CATHETERIZATION WITH CORONARY ANGIOGRAM;  Surgeon: Burnell Blanks, MD;  Location: [facility] CATH LAB;  Service: Cardiovascular;  Laterality: N/A;  . LEFT HEART CATHETERIZATION WITH CORONARY/GRAFT ANGIOGRAM N/A 12/10/2013   Procedure: LEFT HEART CATHETERIZATION WITH Beatrix Fetters;  Surgeon: [name], MD;  Location: [facility] CATH LAB;  Service: Cardiovascular;  Laterality: N/A;  . OVARY SURGERY     ovarian cancer  . POLYPECTOMY  03/09/2019   Procedure: POLYPECTOMY;  Surgeon: Rogene Houston, MD;  [facility];  Service: Endoscopy;;  cecal   . POLYPECTOMY N/A 09/26/2019   Procedure: DUODENAL POLYPECTOMY;  Surgeon: Rogene Houston, MD;  [facility];  Service: Endoscopy;  Laterality: N/A;  . REVISION OF ARTERIOVENOUS GORETEX GRAFT N/A 02/24/2017   Procedure: REVISION OF ARTERIOVENOUS GORETEX GRAFT (RESECTION);  Surgeon: Katha Cabal, MD;  Location: [facility] ORS;  Service: Vascular;  Laterality: N/A;  Earney Mallet N/A 10/15/2013   Procedure: Fistulogram;  Surgeon: Serafina Mitchell, MD;  Location: [facility] CATH LAB;  Service: Cardiovascular;  Laterality: N/A;  . THROMBECTOMY / ARTERIOVENOUS GRAFT REVISION  2011   left upper arm  . TUBAL LIGATION  1980's  . UPPER EXTREMITY ANGIOGRAPHY  Bilateral 12/06/2016   Procedure: Upper Extremity Angiography;  Surgeon: Katha Cabal, MD;  Location: [place] CV LAB;  Service: Cardiovascular;  Laterality: Bilateral;  . UPPER EXTREMITY INTERVENTION Left 06/06/2017   Procedure: UPPER EXTREMITY INTERVENTION;  Surgeon: Katha Cabal, MD;  Location: [place] CV LAB;  Service: Cardiovascular;  Laterality: Left;    reports that she has never smoked. She has never used smokeless tobacco. She reports that she does not drink alcohol and does not use drugs.  Allergies  Allergen Reactions  . Aspirin Other (See Comments)    High Doses Mess up her stomach; "makes my bowels have blood in them". Takes 81 mg EC Aspirin   .  Penicillins Other (See Comments)    SYNCOPE? , "makes me real weak when I take it; like I'll pass out"  Has patient had a PCN reaction causing immediate rash, facial/tongue/throat swelling, SOB or lightheadedness with hypotension: Yes Has patient had a PCN reaction causing severe rash involving mucus membranes or skin necrosis: no Has patient had a PCN reaction that required hospitalization no Has patient had a PCN reaction occurring within the last 10 years: no If all of the above  . Amlodipine Swelling  . Bactrim [Sulfamethoxazole-Trimethoprim] Rash  . Contrast Media [Iodinated Diagnostic Agents] Itching  . Iron Itching and Other (See Comments)    "they gave me iron in dialysis; had to give me Benadryl cause I had to have the iron" (05/02/2012)  . Nitrofurantoin Hives  . Tylenol [Acetaminophen] Itching and Other (See Comments)    Makes her feet on fire per pt  . Gabapentin Other (See Comments)    Unknown reaction  . Hydralazine Itching  . Dexilant [Dexlansoprazole] Other (See Comments)    Upset stomach  . Levaquin [Levofloxacin In D5w] Rash  . Morphine And Related Itching and Other (See Comments)    Itching in feet  . Plavix [Clopidogrel Bisulfate] Rash  . Protonix [Pantoprazole Sodium] Rash  .  Venofer [Ferric Oxide] Itching and Other (See Comments)    Patient reports using Benadryl prior to doses as [facility]    Family History  Problem Relation Age of Onset  . Heart disease Mother        Heart Disease before age 56  . Hyperlipidemia Mother   . Hypertension Mother   . Diabetes Mother   . Heart attack Mother   . Heart disease Father        Heart Disease before age 29  . Hyperlipidemia Father   . Hypertension Father   . Diabetes Father   . Diabetes Sister   . Hypertension Sister   . Diabetes Brother   . Hyperlipidemia Brother   . Heart attack Brother   . Hypertension Sister   . Heart attack Brother   . Colon cancer Child 42  . Other Other        noncontributory for early CAD  . Esophageal cancer Neg Hx   . Liver disease Neg Hx   . Kidney disease Neg Hx   . Colon polyps Neg Hx      Prior to Admission medications   Medication Sig Start Date End Date Taking? Authorizing Provider  amiodarone (PACERONE) 200 MG tablet Take 1 tablet (200 mg total) by mouth daily. Starting July 2 12/17/19  Yes Strader, Fransisco Hertz, PA-C  aspirin EC 81 MG tablet Take 81 mg by mouth daily.  09/27/19  Yes Rehman, Mechele Dawley, MD  fluticasone (FLONASE) 50 MCG/ACT nasal spray Place 1 spray at bedtime as needed into both nostrils for allergies.    Yes [provider]  gabapentin (NEURONTIN) 100 MG capsule Take 100 mg by mouth daily.  04/16/20  Yes [provider]  isosorbide mononitrate (IMDUR) 60 MG 24 hr tablet Take 1 tablet (60 mg total) by mouth 2 (two) times daily. 10/22/19 02/11/21 Yes Herminio Commons, MD  lidocaine-prilocaine (EMLA) cream Apply 1 application topically every Monday, Wednesday, and Friday. Prior to dialysis   Yes [provider]  loratadine (CLARITIN) 10 MG tablet Take 1 tablet (10 mg total) by mouth daily as needed for allergies. 07/14/18  Yes Hosie Poisson, MD  multivitamin (RENA-VIT) TABS tablet Take 1 tablet by  mouth daily.   Yes [provider]  nitroGLYCERIN (NITROSTAT) 0.4 MG SL tablet DISSOLVE ONE TABLET UNDER THE TONGUE EVERY 5 MINUTES AS NEEDED FOR CHEST PAIN.&nbsp;&nbsp;DO NOT EXCEED A TOTAL OF 3 DOSES IN 15 MINUTES Patient taking differently: Place 0.4 mg under the tongue every 5 (five) minutes as needed for chest pain.  11/22/19  Yes Emokpae, Courage, MD  nystatin-triamcinolone (MYCOLOG II) cream Apply 1 application topically 2 (two) times daily. 02/25/20  Yes Rehman, Mechele Dawley, MD  omeprazole (PRILOSEC) 40 MG capsule Take 1 capsule (40 mg total) by mouth daily before breakfast. 03/31/20  Yes Laurine Blazer A, PA-C  ondansetron (ZOFRAN-ODT) 4 MG disintegrating tablet Take 4 mg by mouth every 8 (eight) hours as needed for nausea or vomiting.  03/05/19  Yes [provider]  simvastatin (ZOCOR) 20 MG tablet TAKE 1 TABLET BY MOUTH ONCE DAILY AT BEDTIME Patient taking differently: Take 20 mg by mouth at bedtime.  07/22/19  Yes Herminio Commons, MD  ALPRAZolam Duanne Moron) 0.5 MG tablet Take 0.5 mg by mouth at bedtime as needed for anxiety.    [provider]  dicyclomine (BENTYL) 10 MG capsule Take 10 mg by mouth 4 (four) times daily -  before meals and at bedtime.    [provider]  diphenhydrAMINE (BENADRYL ALLERGY) 25 MG tablet Take 1 tablet (25 mg total) by mouth once for 1 dose. Take 1 tablet 1 hour prior to scheduled CT 02/09/20 04/30/20  Franchot Gallo, MD   Physical Exam: Vitals:   05/06/20 1300 05/06/20 1410 05/06/20 1430 05/06/20 1500  BP: (!) 158/59 (!) 161/60 (!) 154/55 (!) 158/56  Pulse: 67 69    Resp: 20 16 (!) 23 19  Temp:      TempSrc:      SpO2: 95% 100%    Weight:      Height:       Constitutional: elderly female, awake, alert, NAD, calm, comfortable Eyes: PERRL, lids and conjunctivae normal ENMT: Mucous membranes are pale. Posterior pharynx clear of any exudate or lesions.  Neck: normal, supple, no masses, no thyromegaly Respiratory:  no wheezing, no crackles. Normal  respiratory effort. No accessory muscle use.  Cardiovascular:  Normal s1, s2 sounds, trace pretibial lower extremity edema. 2+ pedal pulses. No carotid bruits.  Abdomen: no tenderness, no masses palpated. No hepatosplenomegaly. Bowel sounds positive.  Musculoskeletal: no clubbing / cyanosis. No joint deformity upper and lower extremities. Good ROM, no contractures. Normal muscle tone.  Skin: no rashes, lesions, ulcers. No induration Neurologic: CN 2-12 grossly intact. Sensation intact, DTR normal. Strength 5/5 in all 4.  Psychiatric: Normal judgment and insight. Alert and oriented x 3. Normal mood.   Labs on Admission: I have personally reviewed following labs and imaging studies  CBC: Recent Labs  Lab 05/06/20 1319  WBC 6.3  NEUTROABS 4.6  HGB 6.9*  HCT 22.8*  MCV 120.6*  PLT 751   Basic Metabolic Panel: Recent Labs  Lab 05/06/20 1319  NA 142  K 4.4  CL 102  CO2 24  GLUCOSE 100*  BUN 50*  CREATININE 7.85*  CALCIUM 7.3*   GFR: Estimated Creatinine Clearance: 4.7 mL/min (A) (by C-G formula based on SCr of 7.85 mg/dL (H)). Liver Function Tests: Recent Labs  Lab 05/06/20 1319  AST 5*  ALT 8  ALKPHOS 87  BILITOT 0.4  PROT 6.4*  ALBUMIN 3.0*   No results for input(s): LIPASE, AMYLASE in the last 168 hours. No results for input(s): AMMONIA in  the last 168 hours. Coagulation Profile: No results for input(s): INR, PROTIME in the last 168 hours. Cardiac Enzymes: No results for input(s): CKTOTAL, CKMB, CKMBINDEX, TROPONINI in the last 168 hours. BNP (last 3 results) No results for input(s): PROBNP in the last 8760 hours. HbA1C: No results for input(s): HGBA1C in the last 72 hours. CBG: No results for input(s): GLUCAP in the last 168 hours. Lipid Profile: No results for input(s): CHOL, HDL, LDLCALC, TRIG, CHOLHDL, LDLDIRECT in the last 72 hours. Thyroid Function Tests: No results for input(s): TSH, T4TOTAL, FREET4, T3FREE, THYROIDAB in the last 72 hours. Anemia  Panel: Recent Labs    05/06/20 1319  VITAMINB12 294  FERRITIN 651*  TIBC 215*  IRON 68  RETICCTPCT 5.6*   Urine analysis:    Component Value Date/Time   COLORURINE YELLOW 12/16/2012 1919   APPEARANCEUR CLOUDY (A) 12/16/2012 1919   LABSPEC 1.009 12/16/2012 1919   PHURINE 7.5 12/16/2012 1919   GLUCOSEU NEGATIVE 12/16/2012 1919   HGBUR TRACE (A) 12/16/2012 1919   BILIRUBINUR NEGATIVE 12/16/2012 1919   KETONESUR NEGATIVE 12/16/2012 1919   PROTEINUR 100 (A) 12/16/2012 1919   UROBILINOGEN 0.2 12/16/2012 1919   NITRITE NEGATIVE 12/16/2012 1919   LEUKOCYTESUR SMALL (A) 12/16/2012 1919    Radiological Exams on Admission: No results found.  EKG: Independently reviewed. Normal sinus rhythm.   Assessment/Plan Principal Problem:   Symptomatic anemia Active Problems:   Essential hypertension, benign   ESRD on hemodialysis (HCC)   HLD (hyperlipidemia)   GERD (gastroesophageal reflux disease)   Anemia in chronic kidney disease   Secondary hyperparathyroidism of renal origin ([place])   Hx of CABG   Chronic diastolic CHF (congestive heart failure) (HCC)   DOE (dyspnea on exertion)   Unstable angina (HCC)   Demand ischemia (HCC)   AF (paroxysmal atrial fibrillation) (HCC)   Iron deficiency anemia   Irritable bowel syndrome   1. Symptomatic anemia - Pt's Hg has drifted down to below 7 and normally it is in the 9 range.  She is being transfused 1 unit PRBC.  May need additional unit during HD.  I don't want to give too much now as we don't know when she will have her HD yet and she is high risk for volume overload and acute CHF.   2. Paroxysmal Atrial fibrillation - Pt is on amiodarone 200 mg daily for rate control. She is only anticoagulated now with aspirin 81 mg daily due to chronic GI bleeding. 3. Chronic GI bleed - Pt has history of small bowel angiodysplasia and colonic polyps followed closely by Dr. Laural Golden. She has had multiple EGD and colonoscopies done.  She was stool guaiac  negative today.  4. CAD s/p CABG with unstable angina - she has been restarted on all of her home cardiac medications including nitroglycerin.   5. ESRD on hemodialysis - She is due for HD today.  I reached out to the nephrology team.  She will have treatment later today or early tomorrow depending on the availability of the service.  Her electrolytes are stable.  Monitor for volume overload as she has not produced urine in 5 years.   6. Chronic diastolic heart failure - Multiple hospitalizations for volume overload.  Pt had HD on Monday.  Pt not having symptoms at this time.  Follow.  7. GERD - protonix ordered for GI protection.   8. Essential hypertension - Resumed all home blood pressure lowering medications.   9. IBS - resume home bentyl as ordered  by Dr. Laural Golden with GI.    DVT prophylaxis: SCD  Code Status: Full   Family Communication: husband at bedside updated   Disposition Plan: Home   Consults called: nephrology   Admission status: OBV   Serah Nicoletti MD [facility] How to contact the [facility] Attending or Consulting provider [place] or covering provider during after hours [place], for this patient?  1. Check the care team in [facility] and look for a) attending/consulting TR[facility] provider listed and b) the [facility] team listed 2. Log into [email or web address] and use [place]'s universal password to access. If you do not have the password, please contact the hospital operator. 3. Locate the [facility] provider you are looking for under [facility] and page to a number that you can be directly reached. 4. If you still have difficulty reaching the provider, please page the [facility] (Director on Call) for the Hospitalists listed on amion for assistance.   If 7PM-7AM, please contact night-coverage [email or web address] Password [email or web address]  05/06/2020, 3:24 PM

## 2020-05-06 NOTE — ED Notes (Signed)
Hospitalist at bedside 

## 2020-05-06 NOTE — ED Provider Notes (Addendum)
[facility] EMERGENCY DEPARTMENT Provider Note   CSN: [id number] Arrival date & time: 05/06/20  1143     History Chief Complaint  Patient presents with  . Weakness  . Abnormal Lab    Shawna [name] is a 79 y.o. female.  HPI Patient is an 79 year old female with a significant past medical history significant for CAD s/p CABG, HTN, HLD, bowel AVM, CKD on HD Monday Wednesday Friday last appointment was Monday which she had before session for, she does not make urine, a flutter on amiodarone.  Has required transfusion of PRBCs in the past because of anemia.  Patient states that she has had general weakness and fatigue for the past week or 2.  She states that she was seen by her primary care doctor on Monday and had blood work done and was called this morning and told that her hemoglobin was 4.  She states that she does not feel short of breath but does feel very fatigued and when she stands up she feels like she is off balance like she is going to pass out.  She has not passed out.  She denies any dark or tarry stools no nosebleeds or bleeding from her gums.  Denies any hemoptysis or blood loss.  No vaginal bleeding.  Denies any chest pain or shortness of breath, denies any headaches or vision changes, no trauma abdominal chest or head.  Denies any other associated symptoms.  She is taken nothing for his symptoms prior to arrival in the ER.  She states her last colonoscopy was 2 months ago.     Past Medical History:  Diagnosis Date  . Acute on chronic respiratory failure with hypoxia ([place]) 10/10/2016  . Anxiety   . Arthritis   . AVM (arteriovenous malformation) of colon   . CAD (coronary artery disease)    a. s/p CABG in 2013 b. DES to D1 in 10/2016. c. cath in 07/2018 showing patent grafts with occlusion of D1 at prior stent site and progression of PDA disease --> medical management recommended  . Carotid artery disease ([place])    a. 82-80% LICA, 0/3491   . Chronic anemia   . Chronic  bronchitis ([place])   . Chronic diastolic CHF (congestive heart failure) ([place])    a. 02/2012 Echo EF 60-65%, nl wall motion, Gr 1 DD, mod MR  . Colon cancer ([place]) 1992  . Esophageal stricture   . ESRD on hemodialysis ([place])    ESRD due to HTN, started dialysis 2011 and gets HD at [facility] with Dr Hinda Lenis on MWF schedule.  Access is LUA AVF as of Sept 2014.   Marland Kitchen GERD (gastroesophageal reflux disease)   . High cholesterol 12/2011  . History of blood transfusion 07/2011; 12/2011; 01/2012 X 2; 04/2012  . History of gout   . History of lower GI bleeding   . Hypertension   . Iron deficiency anemia   . Mitral regurgitation    a. Moderate by echo, 02/2012  . Myocardial infarction ([place])   . NSVT (nonsustained ventricular tachycardia) ([place])   . Ovarian cancer ([place]) 1992  . PAF (paroxysmal atrial fibrillation) ([place])   . Pneumonia ~ 2009  . PUD (peptic ulcer disease)   . TIA (transient ischemic attack)     Patient Active Problem List   Diagnosis Date Noted  . Irritable bowel syndrome 02/25/2020  . Pulmonary edema 10/14/2019  . Adenomatous duodenal polyp 09/10/2019  . History of GI bleed 09/10/2019  . Angina pectoris ([place])  06/05/2019  . Chest pain 06/03/2019  . Rectal bleeding 05/14/2019  . Small intestinal bacterial overgrowth 05/14/2019  . Iron deficiency anemia 04/02/2019  . GI bleed 03/08/2019  . Gastrointestinal hemorrhage with melena 03/06/2019  . Acute respiratory failure with hypoxia ([place]) 12/25/2018  . Elevated troponin 12/14/2018  . Chest pain at rest 07/13/2018  . Hand steal syndrome ([place]) 08/01/2017  . Macrocytic anemia 07/14/2017  . Coronary artery disease of bypass graft of native heart with stable angina pectoris ([place]) 06/05/2017  . Mesenteric ischemia ([place])   . Diverticulitis   . SBO (small bowel obstruction) ([place]) 03/22/2017  . Enteritis   . Complication of vascular access for dialysis 03/19/2017  . Preoperative clearance 01/25/2017  . Symptomatic anemia 10/24/2016  .  H/O non-ST elevation myocardial infarction (NSTEMI) 10/24/2016  . Fluid overload 10/10/2016  . Complication from renal dialysis device 10/10/2016  . Non-ST elevation MI (NSTEMI) ([place])   . Encounter for fitting and adjustment of vascular catheter   . Heme positive stool   . Demand ischemia ([place]) 07/27/2016  . Hypertensive emergency 07/08/2016  . Acute on chronic respiratory failure with hypoxia ([place])   . Cardiac arrest ([place])   . Palliative care encounter   . Goals of care, counseling/discussion   . Hypertensive crisis without congestive heart failure 05/09/2016  . Acute pulmonary edema ([place]) 04/06/2016  . Acute respiratory failure ([place]) 04/06/2016  . Hypertensive crisis 01/27/2016  . History of colon cancer 01/27/2016  . History of ovarian cancer 01/27/2016  . Hypertensive urgency 01/27/2016  . AF (paroxysmal atrial fibrillation) ([place]) 10/14/2015  . Coronary angioplasty status 10/14/2015  . Malignant neoplasm of right ovary ([place]) 10/14/2015  . Narrow complex tachycardia ([place]) 09/08/2015  . SVT (supraventricular tachycardia) ([place]) 09/08/2015  . Influenza A 08/30/2015  . Acute on chronic diastolic CHF (congestive heart failure) ([place]) 05/04/2015  . Unstable angina ([place]) 05/03/2015  . DOE (dyspnea on exertion)   . Essential hypertension   . Pain in joint, lower leg 08/14/2014  . Dacryocystitis 05/29/2013  . Chronic diastolic CHF (congestive heart failure) ([place]) 03/22/2013  . GI bleeding 03/21/2013  . Acute blood loss anemia 03/21/2013  . Vaginal odor 03/12/2013  . Vaginal discharge 03/12/2013  . Occlusion and stenosis of carotid artery without mention of cerebral infarction 01/24/2013  . Hx of CABG 07/05/2012  . Carotid artery disease ([place]) 07/05/2012  . Mitral regurgitation 06/12/2012  . Pneumonia 06/09/2012  . Non-STEMI (non-ST elevated myocardial infarction) ([place]) 06/08/2012  . Ischemic chest pain ([place]) 03/01/2012  . AVM (arteriovenous malformation) of small bowel, acquired  01/20/2012  . GERD (gastroesophageal reflux disease) 01/09/2012  . HLD (hyperlipidemia) 01/05/2012  . Atherosclerotic heart disease of native coronary artery without angina pectoris 12/16/2011  . ESRD on hemodialysis ([name]) 12/16/2011  . Anxiety disorder 05/04/2011  . Anemia in chronic kidney disease 04/29/2011  . Secondary hyperparathyroidism of renal origin ([place]) 04/29/2011  . End stage renal disease ([place]) 04/29/2011  . Gout 04/29/2011  . Hypertensive chronic kidney disease with stage 5 chronic kidney disease or end stage renal disease ([employer]) 04/29/2011    Past Surgical History:  Procedure Laterality Date  . A/V SHUNTOGRAM Left 03/19/2019   Procedure: A/V SHUNTOGRAM;  Surgeon: Katha Cabal, MD;  Location: [place] [facility] LAB;  Service: Cardiovascular;  Laterality: Left;  . ABDOMINAL HYSTERECTOMY  1992  . APPENDECTOMY  06/1990  . AV FISTULA PLACEMENT  07/2009   left upper arm  . AV FISTULA PLACEMENT Right 09/06/2016   Procedure: RIGHT  FOREARM ARTERIOVENOUS (AV) GRAFT;  Surgeon: Elam Dutch, MD;  Location: [facility] OR;  Service: Vascular;  Laterality: Right;  . AV FISTULA PLACEMENT N/A 02/24/2017   Procedure: INSERTION OF ARTERIOVENOUS (AV) GORE-TEX GRAFT ARM (BRACHIAL AXILLARY);  Surgeon: Katha Cabal, MD;  Location: [facility] ORS;  Service: Vascular;  Laterality: N/A;  . [place] Right 09/06/2016   Procedure: REMOVAL OF Right Arm ARTERIOVENOUS GORETEX GRAFT and Vein Patch angioplasty of brachial artery;  Surgeon: Angelia Mould, MD;  Location: [place];  Service: Vascular;  Laterality: Right;  . BIOPSY  09/26/2019   Procedure: BIOPSY;  Surgeon: Rogene Houston, MD;  [facility];  Service: Endoscopy;;  . COLON RESECTION  1992  . COLON SURGERY    . COLONOSCOPY N/A 03/09/2019   Procedure: COLONOSCOPY;  Surgeon: Rogene Houston, MD;  [facility];  Service: Endoscopy;  Laterality: N/A;  . CORONARY ANGIOPLASTY WITH STENT PLACEMENT  12/15/11   "2"  .  CORONARY ANGIOPLASTY WITH STENT PLACEMENT  y/2013   "1; makes total of 3" (05/02/2012)  . CORONARY ARTERY BYPASS GRAFT  06/13/2012   Procedure: CORONARY ARTERY BYPASS GRAFTING (CABG);  Surgeon: Grace Isaac, MD;  Location: [place];  Service: Open Heart Surgery;  Laterality: N/A;  cabg x four;  using left internal mammary artery, and left leg greater saphenous vein harvested endoscopically  . CORONARY STENT INTERVENTION N/A 10/13/2016   Procedure: Coronary Stent Intervention;  Surgeon: Troy Sine, MD;  Location: [place] CV LAB;  Service: Cardiovascular;  Laterality: N/A;  . DIALYSIS/PERMA CATHETER REMOVAL N/A 04/18/2017   Procedure: DIALYSIS/PERMA CATHETER REMOVAL;  Surgeon: Katha Cabal, MD;  Location: [place] CV LAB;  Service: Cardiovascular;  Laterality: N/A;  . DILATION AND CURETTAGE OF UTERUS    . ESOPHAGOGASTRODUODENOSCOPY  01/20/2012   Procedure: ESOPHAGOGASTRODUODENOSCOPY (EGD);  Surgeon: Ladene Artist, MD,FACG;  Location: [facility] ENDOSCOPY;  Service: Endoscopy;  Laterality: N/A;  . ESOPHAGOGASTRODUODENOSCOPY N/A 03/26/2013   Procedure: ESOPHAGOGASTRODUODENOSCOPY (EGD);  Surgeon: Irene Shipper, MD;  Location: [facility] ENDOSCOPY;  Service: Endoscopy;  Laterality: N/A;  . ESOPHAGOGASTRODUODENOSCOPY N/A 04/30/2015   Procedure: ESOPHAGOGASTRODUODENOSCOPY (EGD);  Surgeon: Rogene Houston, MD;  [facility];  Service: Endoscopy;  Laterality: N/A;  1pm - moved to 10/20 @ 1:10  . ESOPHAGOGASTRODUODENOSCOPY N/A 07/29/2016   Procedure: ESOPHAGOGASTRODUODENOSCOPY (EGD);  Surgeon: Manus Gunning, MD;  Location: [place];  Service: Gastroenterology;  Laterality: N/A;  enteroscopy  . ESOPHAGOGASTRODUODENOSCOPY N/A 09/26/2019   Procedure: ESOPHAGOGASTRODUODENOSCOPY (EGD);  Surgeon: Rogene Houston, MD;  [facility];  Service: Endoscopy;  Laterality: N/A;  1250  . GIVENS CAPSULE STUDY N/A 03/07/2019   Procedure: GIVENS CAPSULE STUDY;  Surgeon: Rogene Houston, MD;   [facility];  Service: Endoscopy;  Laterality: N/A;  7:30  . INTRAOPERATIVE TRANSESOPHAGEAL ECHOCARDIOGRAM  06/13/2012   Procedure: INTRAOPERATIVE TRANSESOPHAGEAL ECHOCARDIOGRAM;  Surgeon: Grace Isaac, MD;  Location: [place];  Service: Open Heart Surgery;  Laterality: N/A;  . IR GENERIC HISTORICAL  07/26/2016   IR FLUORO GUIDE CV LINE RIGHT 07/26/2016 Greggory Keen, MD [facility]  . IR GENERIC HISTORICAL  07/26/2016   IR US GUIDE VASC ACCESS RIGHT 07/26/2016 Greggory Keen, MD [facility]  . IR GENERIC HISTORICAL  08/02/2016   IR US GUIDE VASC ACCESS RIGHT 08/02/2016 Greggory Keen, MD [facility]  . IR GENERIC HISTORICAL  08/02/2016   IR FLUORO GUIDE CV LINE RIGHT 08/02/2016 Greggory Keen, MD [facility]  .  IR RADIOLOGY PERIPHERAL GUIDED IV START  03/28/2017  . IR US GUIDE VASC ACCESS RIGHT  03/28/2017  . LEFT HEART CATH AND CORONARY ANGIOGRAPHY N/A 09/20/2016   Procedure: Left Heart Cath and Coronary Angiography;  Surgeon: [name], MD;  Location: [place] CV LAB;  Service: Cardiovascular;  Laterality: N/A;  . LEFT HEART CATH AND CORS/GRAFTS ANGIOGRAPHY N/A 10/13/2016   Procedure: Left Heart Cath and Cors/Grafts Angiography;  Surgeon: Troy Sine, MD;  Location: [place] CV LAB;  Service: Cardiovascular;  Laterality: N/A;  . LEFT HEART CATH AND CORS/GRAFTS ANGIOGRAPHY N/A 07/13/2018   Procedure: LEFT HEART CATH AND CORS/GRAFTS ANGIOGRAPHY;  Surgeon: Martinique, Peter M, MD;  Location: [place] CV LAB;  Service: Cardiovascular;  Laterality: N/A;  . LEFT HEART CATHETERIZATION WITH CORONARY ANGIOGRAM N/A 12/15/2011   Procedure: LEFT HEART CATHETERIZATION WITH CORONARY ANGIOGRAM;  Surgeon: Burnell Blanks, MD;  Location: [facility] CATH LAB;  Service: Cardiovascular;  Laterality: N/A;  . LEFT HEART CATHETERIZATION WITH CORONARY ANGIOGRAM N/A 01/10/2012   Procedure: LEFT HEART CATHETERIZATION WITH CORONARY ANGIOGRAM;  Surgeon: Peter M Martinique, MD;  Location: [facility] CATH LAB;  Service:  Cardiovascular;  Laterality: N/A;  . LEFT HEART CATHETERIZATION WITH CORONARY ANGIOGRAM N/A 06/08/2012   Procedure: LEFT HEART CATHETERIZATION WITH CORONARY ANGIOGRAM;  Surgeon: Burnell Blanks, MD;  Location: [facility] CATH LAB;  Service: Cardiovascular;  Laterality: N/A;  . LEFT HEART CATHETERIZATION WITH CORONARY/GRAFT ANGIOGRAM N/A 12/10/2013   Procedure: LEFT HEART CATHETERIZATION WITH Beatrix Fetters;  Surgeon: [name], MD;  Location: [facility] CATH LAB;  Service: Cardiovascular;  Laterality: N/A;  . OVARY SURGERY     ovarian cancer  . POLYPECTOMY  03/09/2019   Procedure: POLYPECTOMY;  Surgeon: Rogene Houston, MD;  [facility];  Service: Endoscopy;;  cecal   . POLYPECTOMY N/A 09/26/2019   Procedure: DUODENAL POLYPECTOMY;  Surgeon: Rogene Houston, MD;  [facility];  Service: Endoscopy;  Laterality: N/A;  . REVISION OF ARTERIOVENOUS GORETEX GRAFT N/A 02/24/2017   Procedure: REVISION OF ARTERIOVENOUS GORETEX GRAFT (RESECTION);  Surgeon: Katha Cabal, MD;  Location: [facility] ORS;  Service: Vascular;  Laterality: N/A;  Earney Mallet N/A 10/15/2013   Procedure: Fistulogram;  Surgeon: Serafina Mitchell, MD;  Location[facility] CATH LAB;  Service: Cardiovascular;  Laterality: N/A;  . THROMBECTOMY / ARTERIOVENOUS GRAFT REVISION  2011   left upper arm  . TUBAL LIGATION  1980's  . UPPER EXTREMITY ANGIOGRAPHY Bilateral 12/06/2016   Procedure: Upper Extremity Angiography;  Surgeon: Katha Cabal, MD;  Location: [place] CV LAB;  Service: Cardiovascular;  Laterality: Bilateral;  . UPPER EXTREMITY INTERVENTION Left 06/06/2017   Procedure: UPPER EXTREMITY INTERVENTION;  Surgeon: Katha Cabal, MD;  Location: [place] CV LAB;  Service: Cardiovascular;  Laterality: Left;     OB History    Gravida  2   Para  2   Term      Preterm  2   AB      Living  2     SAB      TAB      Ectopic      Multiple      Live Births               Family History  Problem Relation Age of Onset  . Heart disease Mother        Heart Disease before age 81  . Hyperlipidemia Mother   . Hypertension Mother   . Diabetes  Mother   . Heart attack Mother   . Heart disease Father        Heart Disease before age 73  . Hyperlipidemia Father   . Hypertension Father   . Diabetes Father   . Diabetes Sister   . Hypertension Sister   . Diabetes Brother   . Hyperlipidemia Brother   . Heart attack Brother   . Hypertension Sister   . Heart attack Brother   . Colon cancer Child 85  . Other Other        noncontributory for early CAD  . Esophageal cancer Neg Hx   . Liver disease Neg Hx   . Kidney disease Neg Hx   . Colon polyps Neg Hx     Social History   Tobacco Use  . Smoking status: Never Smoker  . Smokeless tobacco: Never Used  Vaping Use  . Vaping Use: Never used  Substance Use Topics  . Alcohol use: No    Alcohol/week: 0.0 standard drinks  . Drug use: No    Home Medications Prior to Admission medications   Medication Sig Start Date End Date Taking? Authorizing Provider  amiodarone (PACERONE) 200 MG tablet Take 1 tablet (200 mg total) by mouth daily. Starting July 2 12/17/19   Ahmed Prima Fransisco Hertz, PA-C  aspirin EC 81 MG tablet Take 1 tablet (81 mg total) by mouth daily. Restart on 03/13/19 09/27/19   Rogene Houston, MD  dicyclomine (BENTYL) 10 MG capsule Take 10 mg by mouth 4 (four) times daily -  before meals and at bedtime.    [provider]  diphenhydrAMINE (BENADRYL ALLERGY) 25 MG tablet Take 1 tablet (25 mg total) by mouth once for 1 dose. Take 1 tablet 1 hour prior to scheduled CT 02/09/20 04/30/20  Franchot Gallo, MD  fluticasone [facility]) 50 MCG/ACT nasal spray Place 1 spray at bedtime as needed into both nostrils for allergies.     [provider]  gabapentin (NEURONTIN) 100 MG capsule  04/16/20   [provider]  isosorbide mononitrate (IMDUR) 60 MG 24 hr tablet Take 1 tablet (60 mg total)  by mouth 2 (two) times daily. 10/22/19 02/11/21  Herminio Commons, MD  lidocaine-prilocaine (EMLA) cream Apply 1 application topically every Monday, Wednesday, and Friday. Prior to dialysis    [provider]  loratadine (CLARITIN) 10 MG tablet Take 1 tablet (10 mg total) by mouth daily as needed for allergies. 07/14/18   Hosie Poisson, MD  multivitamin (RENA-VIT) TABS tablet Take 1 tablet by mouth daily.    [provider]  nitroGLYCERIN (NITROSTAT) 0.4 MG SL tablet DISSOLVE ONE TABLET UNDER THE TONGUE EVERY 5 MINUTES AS NEEDED FOR CHEST PAIN.  DO NOT EXCEED A TOTAL OF 3 DOSES IN 15 MINUTES 11/22/19   Emokpae, Courage, MD  nystatin cream (MYCOSTATIN) Apply topically 2 (two) times daily. 02/26/20   [provider]  nystatin-triamcinolone (MYCOLOG II) cream Apply 1 application topically 2 (two) times daily. 02/25/20   Rogene Houston, MD  omeprazole (PRILOSEC) 40 MG capsule Take 1 capsule (40 mg total) by mouth daily before breakfast. 03/31/20   Laurine Blazer A, PA-C  ondansetron (ZOFRAN-ODT) 4 MG disintegrating tablet Take 4 mg by mouth every 8 (eight) hours as needed for nausea or vomiting.  03/05/19   [provider]  simvastatin (ZOCOR) 20 MG tablet TAKE 1 TABLET BY MOUTH ONCE DAILY AT BEDTIME Patient taking differently: Take 20 mg by mouth at bedtime.  07/22/19   Kate Sable  A, MD  triamcinolone cream (KENALOG) 0.1 % Apply 1 application topically daily as needed (Dry skin/ itching).  09/11/19   [provider]    Allergies    Aspirin, Penicillins, Amlodipine, Bactrim [sulfamethoxazole-trimethoprim], Contrast media [iodinated diagnostic agents], Iron, Nitrofurantoin, Tylenol [acetaminophen], Gabapentin, Hydralazine, Dexilant [dexlansoprazole], Levaquin [levofloxacin in d5w], Morphine and related, Plavix [clopidogrel bisulfate], Protonix [pantoprazole sodium], and Venofer [ferric oxide]  Review of Systems   Review of Systems  Constitutional: Positive  for fatigue. Negative for chills and fever.  HENT: Negative for congestion and nosebleeds.   Respiratory: Negative for shortness of breath.   Cardiovascular: Negative for chest pain.  Gastrointestinal: Negative for abdominal distention.  Genitourinary: Negative for dysuria and vaginal bleeding.  Musculoskeletal: Negative for back pain.  Neurological: Positive for light-headedness. Negative for dizziness and headaches.    Physical Exam Updated Vital Signs BP (!) 154/55   Pulse 67   Temp 98 F (36.7 C) (Oral)   Resp (!) 23   Ht 5\' 1"  (1.549 m)   Wt 59 kg   SpO2 95%   BMI 24.56 kg/m   Physical Exam Vitals and nursing note reviewed.  Constitutional:      General: She is not in acute distress.    Comments: Somewhat fatigued appearing 79 year old female in no acute stress and comfortably in bed.  Able to answer questions appropriately follow commands, pleasant.  HENT:     Head: Normocephalic and atraumatic.     Nose: Nose normal.     Mouth/Throat:     Mouth: Mucous membranes are moist.     Comments: Pale, moist oral mucosa Eyes:     General: No scleral icterus.    Comments: Pale conjunctiva  Cardiovascular:     Rate and Rhythm: Normal rate and regular rhythm.     Pulses: Normal pulses.     Heart sounds: Normal heart sounds.  Pulmonary:     Effort: Pulmonary effort is normal. No respiratory distress.     Breath sounds: No wheezing.  Abdominal:     Palpations: Abdomen is soft.     Tenderness: There is no abdominal tenderness. There is no guarding or rebound.  Genitourinary:    Comments: Guaiac negative. Sample obtained by nursing staff. Pt deferred rectal Musculoskeletal:     Cervical back: Normal range of motion.     Right lower leg: No edema.     Left lower leg: No edema.  Skin:    General: Skin is warm and dry.     Capillary Refill: Capillary refill takes less than 2 seconds.     Comments: Palms are pale  Neurological:     Mental Status: She is alert. Mental  status is at baseline.  Psychiatric:        Mood and Affect: Mood normal.        Behavior: Behavior normal.     ED Results / Procedures / Treatments   Labs (all labs ordered are listed, but only abnormal results are displayed) Labs Reviewed  COMPREHENSIVE METABOLIC PANEL - Abnormal; Notable for the following components:      Result Value   Glucose, Bld 100 (*)    BUN 50 (*)    Creatinine, Ser 7.85 (*)    Calcium 7.3 (*)    Total Protein 6.4 (*)    Albumin 3.0 (*)    AST 5 (*)    GFR, Estimated 5 (*)    Anion gap 16 (*)    All other components within normal limits  CBC WITH DIFFERENTIAL/PLATELET - Abnormal; Notable for the following components:   RBC 1.89 (*)    Hemoglobin 6.9 (*)    HCT 22.8 (*)    MCV 120.6 (*)    MCH 36.5 (*)    RDW 19.5 (*)    nRBC 0.3 (*)    All other components within normal limits  RETICULOCYTES - Abnormal; Notable for the following components:   Retic Ct Pct 5.6 (*)    RBC. 1.93 (*)    Immature Retic Fract 36.2 (*)    All other components within normal limits  RESPIRATORY PANEL BY RT PCR (FLU A&B, COVID)  VITAMIN B12  FOLATE  IRON AND TIBC  FERRITIN  POC OCCULT BLOOD, ED  TYPE AND SCREEN  PREPARE RBC (CROSSMATCH)    EKG None  Radiology No results found.  Procedures .Critical Care Performed by: Tedd Sias, PA Authorized by: Tedd Sias, PA   Critical care provider statement:    Critical care time (minutes):  45   Critical care was necessary to treat or prevent imminent or life-threatening deterioration of the following conditions: severe anemia requiring transfusion.   Critical care was time spent personally by me on the following activities:  Discussions with consultants, evaluation of patient's response to treatment, examination of patient, ordering and performing treatments and interventions, ordering and review of laboratory studies, ordering and review of radiographic studies, pulse oximetry, re-evaluation of  patient's condition, obtaining history from patient or surrogate and review of old charts   (including critical care time)  Medications Ordered in ED Medications  0.9 %  sodium chloride infusion (Manually program via Guardrails IV Fluids) (has no administration in time range)    ED Course  I have reviewed the triage vital signs and the nursing notes.  Pertinent labs & imaging results that were available during my care of the patient were reviewed by me and considered in my medical decision making (see chart for details).  Patient is 79 year old female with past medical history significant for ESRD on hemodialysis with no urine production is in today with complaints of fatigue and was told to come to the ER by her primary care doctor after she was found to have a hemoglobin of 4.  Says history of CAD and bowel AVM.  She denies any dark or tarry stools however and denies any bleeding from gums or any injuries.  No epistaxis.  Physical exam is notable for no acute abnormalities.  She does appear very pale and has nearly colorless conjunctiva bilaterally as well as pale oral mucosa and pale palms.  Vital signs within normal limits however.  Clinical Course as of May 06 1452  Wed May 06, 2020  1325 Given reported hgb of 4 and significant symptoms (near syncope and fatigue) will likely need transfusion. I am unable to see Hgb on care everywhere. Will likely transfuse. Will obtain type/screen and anemia panel as well as fecal occult to evaluate for source.   [WF]  1497 Informed by nursing that hgb 6.9   [WF]  1435 CMP with some elevated creatinine and BUN from baseline however patient is due for dialysis today.  Very mild anion gap.  Potassium within normal limits.  Comprehensive metabolic panel(!) [WF]  0263 CBC notable for significant anemia of 6.9 she is having symptoms as well.  Will transfuse 1 unit and admit to hospital.  CBC with Differential/Platelet(!!) [WF]  1451 Discussed Dr. Wynetta Emery of  hospitalist service who will admit to hospital.   [  WF]    Clinical Course User Index [WF] Tedd Sias, [place]   MDM Rules/Calculators/A&P                          I discussed with the patient and obtained consent for blood transfusion.  We will transfuse with 1 unit of PRBCs and admit to hospital for severe symptomatic anemia.  Anemia panel obtained.   I discussed this case with my attending physician who cosigned this note including patient's presenting symptoms, physical exam, and planned diagnostics and interventions. Attending physician stated agreement with plan or made changes to plan which were implemented.   Attending physician assessed patient at bedside. Agrees to plan.  Final Clinical Impression(s) / ED Diagnoses Final diagnoses:  Symptomatic anemia    Rx / DC Orders ED Discharge Orders    None       Tedd Sias, [place] 05/06/20 [place], [place], [place] 05/06/20 [id number]    Fredia Sorrow, MD 05/14/20 [phone]

## 2020-05-07 DIAGNOSIS — D649 Anemia, unspecified: Secondary | ICD-10-CM | POA: Diagnosis not present

## 2020-05-07 DIAGNOSIS — I5032 Chronic diastolic (congestive) heart failure: Secondary | ICD-10-CM | POA: Diagnosis not present

## 2020-05-07 DIAGNOSIS — I248 Other forms of acute ischemic heart disease: Secondary | ICD-10-CM

## 2020-05-07 DIAGNOSIS — N186 End stage renal disease: Secondary | ICD-10-CM | POA: Diagnosis not present

## 2020-05-07 DIAGNOSIS — I480 Paroxysmal atrial fibrillation: Secondary | ICD-10-CM | POA: Diagnosis not present

## 2020-05-07 DIAGNOSIS — I10 Essential (primary) hypertension: Secondary | ICD-10-CM

## 2020-05-07 LAB — BASIC METABOLIC PANEL
Anion gap: 12 (ref 5–15)
BUN: 27 mg/dL — ABNORMAL HIGH (ref 8–23)
CO2: 29 mmol/L (ref 22–32)
Calcium: 7.6 mg/dL — ABNORMAL LOW (ref 8.9–10.3)
Chloride: 95 mmol/L — ABNORMAL LOW (ref 98–111)
Creatinine, Ser: 4.79 mg/dL — ABNORMAL HIGH (ref 0.44–1.00)
GFR, Estimated: 9 mL/min — ABNORMAL LOW (ref >60)
Glucose, Bld: 80 mg/dL (ref 70–99)
Potassium: 3.5 mmol/L (ref 3.5–5.1)
Sodium: 136 mmol/L (ref 135–145)

## 2020-05-07 LAB — CBC
HCT: 29.7 % — ABNORMAL LOW (ref 36.0–46.0)
Hemoglobin: 9.4 g/dL — ABNORMAL LOW (ref 12.0–15.0)
MCH: 32.3 pg (ref 26.0–34.0)
MCHC: 31.6 g/dL (ref 30.0–36.0)
MCV: 102.1 fL — ABNORMAL HIGH (ref 80.0–100.0)
Platelets: 263 10*3/uL (ref 150–400)
RBC: 2.91 MIL/uL — ABNORMAL LOW (ref 3.87–5.11)
RDW: 28.1 % — ABNORMAL HIGH (ref 11.5–15.5)
WBC: 7.8 10*3/uL (ref 4.0–10.5)
nRBC: 0.3 % — ABNORMAL HIGH (ref 0.0–0.2)

## 2020-05-07 LAB — PREPARE RBC (CROSSMATCH)

## 2020-05-07 LAB — MAGNESIUM: Magnesium: 1.7 mg/dL (ref 1.7–2.4)

## 2020-05-07 LAB — PHOSPHORUS: Phosphorus: 3 mg/dL (ref 2.5–4.6)

## 2020-05-07 LAB — HEPATITIS B SURFACE ANTIGEN: Hepatitis B Surface Ag: NONREACTIVE

## 2020-05-07 MED ORDER — NITROGLYCERIN 0.4 MG SL SUBL: 0.4000 mg | SUBLINGUAL_TABLET | SUBLINGUAL | 1 refills | Status: DC | PRN

## 2020-05-07 NOTE — Discharge Summary (Signed)
Physician Discharge Summary  Shawna [name] XVQ:[id number] DOB: 1940-11-28 DOA: 05/06/2020  PCP: [name], MD Nephrology: Theador Hawthorne GI: Rehman   Admit date: 05/06/2020 Discharge date: 05/07/2020  Admitted From: Home  Disposition: Home   Recommendations for Outpatient Follow-up:  1. Follow up with Dr.Bhutani in 1 weeks 2. Follow up with GI in 2 weeks as scheduled 3. Follow up with PCP in 1-2 weeks  4. Please check CBC in 1 week 5. Please resume regular hemodialysis schedule   Discharge Condition: STABLE   CODE STATUS: FULL    Brief Hospitalization Summary: Please see all hospital notes, images, labs for full details of the hospitalization. ADMISSION HPI: Shawna [name] is a 79 y.o. female with medical history significant for chronic GI bleeding secondary to small bowel angiodysplasia as well as colonic polyps who is followed closely by GI and only on aspirin for anticoagulation, ESRD on HD MWF, CAD s/p CABG, unstable angina, multiple hospitalizations for CHF and volume overload, has not produced urine in the past 4 to 5 years, was told by her physician to come to ED due to low blood count.  Pt had been complaining of generalized weakness and fatigue.  Pt denied blood in stool and denied black tarry stool.  She was told her Hg was down to 4 but when checked today it was 6.9.  She was ordered to receive 1 unit PRBC.  Pt missed hemodialysis today.  Pt has had multiple prior transfusions for anemia in the past.  Her anemia is thought to be multifactorial given her end stage renal disease and chronic blood loss and iron deficiency.  Her baseline hemoglobin has been around 9 from multiple prior tests.  She tested hemoccult (guaiac) negative today.   Given that she is having symptoms of anemia she is being transfused 1 unit PRBC and observed in the hospital.   She is denying chest pain symptoms at this time.  Her electrolytes are stable.  She is vaccinated and Sars 2 coronavirus test is negative.  Pt  was admitted with symptomatic anemia Hg down to 6.9. She was transfused 2 units PRBC with her hemodialysis and tolerated it well. She was hemoccult guaiac negative.  She felt much better after treatments and feels she is back to her baseline.  Her Hg is up to 9.4 which is her baseline.  She has close GI follow up with [place] [facility].  She is stable to discharge home.  Follow up with Dr. Theador Hawthorne recommended.  Please check CBC in 1 week or check during hemodialysis.    Discharge Diagnoses:  Principal Problem:   Symptomatic anemia Active Problems:   Essential hypertension, benign   ESRD on hemodialysis (HCC)   HLD (hyperlipidemia)   GERD (gastroesophageal reflux disease)   Anemia in chronic kidney disease   Secondary hyperparathyroidism of renal origin ([place])   Hx of CABG   Chronic diastolic CHF (congestive heart failure) (HCC)   DOE (dyspnea on exertion)   Unstable angina (HCC)   Demand ischemia (HCC)   AF (paroxysmal atrial fibrillation) (HCC)   Iron deficiency anemia   Irritable bowel syndrome   Discharge Instructions:  Allergies as of 05/07/2020      Reactions   Aspirin Other (See Comments)   High Doses Mess up her stomach; "makes my bowels have blood in them". Takes 81 mg EC Aspirin    Penicillins Other (See Comments)   SYNCOPE? , "makes me real weak when I take it; like I'll pass out" Has  patient had a PCN reaction causing immediate rash, facial/tongue/throat swelling, SOB or lightheadedness with hypotension: Yes Has patient had a PCN reaction causing severe rash involving mucus membranes or skin necrosis: no Has patient had a PCN reaction that required hospitalization no Has patient had a PCN reaction occurring within the last 10 years: no If all of the above   Amlodipine Swelling   Bactrim [sulfamethoxazole-trimethoprim] Rash   Contrast Media [iodinated Diagnostic Agents] Itching   Iron Itching, Other (See Comments)   "they gave me iron in dialysis; had to give me  Benadryl cause I had to have the iron" (05/02/2012)   Nitrofurantoin Hives   Tylenol [acetaminophen] Itching, Other (See Comments)   Makes her feet on fire per pt   Gabapentin Other (See Comments)   Unknown reaction   Hydralazine Itching   Dexilant [dexlansoprazole] Other (See Comments)   Upset stomach   Levaquin [levofloxacin In D5w] Rash   Morphine And Related Itching, Other (See Comments)   Itching in feet   Plavix [clopidogrel Bisulfate] Rash   Protonix [pantoprazole Sodium] Rash   Venofer [ferric Oxide] Itching, Other (See Comments)   Patient reports using Benadryl prior to doses as [place]      Medication List    TAKE these medications   ALPRAZolam 0.5 MG tablet Commonly known as: XANAX Take 0.5 mg by mouth at bedtime as needed for anxiety.   amiodarone 200 MG tablet Commonly known as: PACERONE Take 1 tablet (200 mg total) by mouth daily. Starting July 2   aspirin EC 81 MG tablet Take 81 mg by mouth daily.   dicyclomine 10 MG capsule Commonly known as: BENTYL Take 10 mg by mouth 4 (four) times daily -  before meals and at bedtime.   diphenhydrAMINE 25 MG tablet Commonly known as: Benadryl Allergy Take 1 tablet (25 mg total) by mouth once for 1 dose. Take 1 tablet 1 hour prior to scheduled CT   fluticasone 50 MCG/ACT nasal spray Commonly known as: FLONASE Place 1 spray at bedtime as needed into both nostrils for allergies.   gabapentin 100 MG capsule Commonly known as: NEURONTIN Take 100 mg by mouth daily.   isosorbide mononitrate 60 MG 24 hr tablet Commonly known as: IMDUR Take 1 tablet (60 mg total) by mouth 2 (two) times daily.   lidocaine-prilocaine cream Commonly known as: EMLA Apply 1 application topically every Monday, Wednesday, and Friday. Prior to dialysis   loratadine 10 MG tablet Commonly known as: CLARITIN Take 1 tablet (10 mg total) by mouth daily as needed for allergies.   multivitamin Tabs tablet Take 1 tablet by mouth daily.    nitroGLYCERIN 0.4 MG SL tablet Commonly known as: NITROSTAT Place 1 tablet (0.4 mg total) under the tongue every 5 (five) minutes as needed for chest pain.   nystatin-triamcinolone cream Commonly known as: MYCOLOG II Apply 1 application topically 2 (two) times daily.   omeprazole 40 MG capsule Commonly known as: PRILOSEC Take 1 capsule (40 mg total) by mouth daily before breakfast.   ondansetron 4 MG disintegrating tablet Commonly known as: ZOFRAN-ODT Take 4 mg by mouth every 8 (eight) hours as needed for nausea or vomiting.   simvastatin 20 MG tablet Commonly known as: ZOCOR TAKE 1 TABLET BY MOUTH ONCE DAILY AT BEDTIME       Follow-up Information    [name], MD. Schedule an appointment as soon as possible for a visit in 1 week(s).   Specialty: Family Medicine Contact information: [address] [place] [address] [phone]        Rogene Houston, MD. Schedule an appointment as soon as possible for a visit in 2 week(s).   Specialty: Gastroenterology Contact information: [address], [address] [place] [address] [phone]        Liana Gerold, MD. Schedule an appointment as soon as possible for a visit in 1 week(s).   Specialty: Nephrology Why: Hospital Follow Up Contact information: [address] [place] [address] [phone]              Allergies  Allergen Reactions  . Aspirin Other (See Comments)    High Doses Mess up her stomach; "makes my bowels have blood in them". Takes 81 mg EC Aspirin   . Penicillins Other (See Comments)    SYNCOPE? , "makes me real weak when I take it; like I'll pass out"  Has patient had a PCN reaction causing immediate rash, facial/tongue/throat swelling, SOB or lightheadedness with hypotension: Yes Has patient had a PCN reaction causing severe rash involving mucus membranes or skin necrosis: no Has patient had a PCN reaction that required hospitalization no Has patient had a PCN  reaction occurring within the last 10 years: no If all of the above  . Amlodipine Swelling  . Bactrim [Sulfamethoxazole-Trimethoprim] Rash  . Contrast Media [Iodinated Diagnostic Agents] Itching  . Iron Itching and Other (See Comments)    "they gave me iron in dialysis; had to give me Benadryl cause I had to have the iron" (05/02/2012)  . Nitrofurantoin Hives  . Tylenol [Acetaminophen] Itching and Other (See Comments)    Makes her feet on fire per pt  . Gabapentin Other (See Comments)    Unknown reaction  . Hydralazine Itching  . Dexilant [Dexlansoprazole] Other (See Comments)    Upset stomach  . Levaquin [Levofloxacin In D5w] Rash  . Morphine And Related Itching and Other (See Comments)    Itching in feet  . Plavix [Clopidogrel Bisulfate] Rash  . Protonix [Pantoprazole Sodium] Rash  . Venofer [Ferric Oxide] Itching and Other (See Comments)    Patient reports using Benadryl prior to doses as [place] as of 05/07/2020      Reactions   Aspirin Other (See Comments)   High Doses Mess up her stomach; "makes my bowels have blood in them". Takes 81 mg EC Aspirin    Penicillins Other (See Comments)   SYNCOPE? , "makes me real weak when I take it; like I'll pass out" Has patient had a PCN reaction causing immediate rash, facial/tongue/throat swelling, SOB or lightheadedness with hypotension: Yes Has patient had a PCN reaction causing severe rash involving mucus membranes or skin necrosis: no Has patient had a PCN reaction that required hospitalization no Has patient had a PCN reaction occurring within the last 10 years: no If all of the above   Amlodipine Swelling   Bactrim [sulfamethoxazole-trimethoprim] Rash   Contrast Media [iodinated Diagnostic Agents] Itching   Iron Itching, Other (See Comments)   "they gave me iron in dialysis; had to give me Benadryl cause I had to have the iron" (05/02/2012)   Nitrofurantoin Hives   Tylenol [acetaminophen] Itching, Other (See  Comments)   Makes her feet on fire per pt   Gabapentin Other (See Comments)   Unknown reaction   Hydralazine Itching   Dexilant [dexlansoprazole] Other (See Comments)   Upset stomach   Levaquin [levofloxacin In D5w] Rash   Morphine  And Related Itching, Other (See Comments)   Itching in feet   Plavix [clopidogrel Bisulfate] Rash   Protonix [pantoprazole Sodium] Rash   Venofer [ferric Oxide] Itching, Other (See Comments)   Patient reports using Benadryl prior to doses as [place]      Medication List    TAKE these medications   ALPRAZolam 0.5 MG tablet Commonly known as: XANAX Take 0.5 mg by mouth at bedtime as needed for anxiety.   amiodarone 200 MG tablet Commonly known as: PACERONE Take 1 tablet (200 mg total) by mouth daily. Starting July 2   aspirin EC 81 MG tablet Take 81 mg by mouth daily.   dicyclomine 10 MG capsule Commonly known as: BENTYL Take 10 mg by mouth 4 (four) times daily -  before meals and at bedtime.   diphenhydrAMINE 25 MG tablet Commonly known as: Benadryl Allergy Take 1 tablet (25 mg total) by mouth once for 1 dose. Take 1 tablet 1 hour prior to scheduled CT   fluticasone 50 MCG/ACT nasal spray Commonly known as: FLONASE Place 1 spray at bedtime as needed into both nostrils for allergies.   gabapentin 100 MG capsule Commonly known as: NEURONTIN Take 100 mg by mouth daily.   isosorbide mononitrate 60 MG 24 hr tablet Commonly known as: IMDUR Take 1 tablet (60 mg total) by mouth 2 (two) times daily.   lidocaine-prilocaine cream Commonly known as: EMLA Apply 1 application topically every Monday, Wednesday, and Friday. Prior to dialysis   loratadine 10 MG tablet Commonly known as: CLARITIN Take 1 tablet (10 mg total) by mouth daily as needed for allergies.   multivitamin Tabs tablet Take 1 tablet by mouth daily.   nitroGLYCERIN 0.4 MG SL tablet Commonly known as: NITROSTAT Place 1 tablet (0.4 mg total) under the tongue every 5  (five) minutes as needed for chest pain.   nystatin-triamcinolone cream Commonly known as: MYCOLOG II Apply 1 application topically 2 (two) times daily.   omeprazole 40 MG capsule Commonly known as: PRILOSEC Take 1 capsule (40 mg total) by mouth daily before breakfast.   ondansetron 4 MG disintegrating tablet Commonly known as: ZOFRAN-ODT Take 4 mg by mouth every 8 (eight) hours as needed for nausea or vomiting.   simvastatin 20 MG tablet Commonly known as: ZOCOR TAKE 1 TABLET BY MOUTH ONCE DAILY AT BEDTIME       Procedures/Studies: DG Chest 2 View  Result Date: 05/01/2020 CLINICAL DATA:  Shortness of breath EXAM: CHEST - 2 VIEW COMPARISON:  02/12/2020 FINDINGS: Prior CABG. Heart is upper limits normal in size. Linear areas of scarring or atelectasis in the lung bases. No effusions. No acute bony abnormality. Aortic atherosclerosis. IMPRESSION: Bibasilar scarring or atelectasis. Aortic atherosclerosis. No active disease. Electronically Signed   By: Rolm Baptise M.D.   On: 05/01/2020 20:11   VAS US DUPLEX DIALYSIS ACCESS (AVF, AVG)  Result Date: 04/16/2020 DIALYSIS ACCESS Access Site: Left Upper Extremity. Access Type: Brachial Axillary AVG. History: 03/19/2019 PTA and stent venous outflow left upper arm AV access. Comparison Study: 10/17/2019 Performing Technologist: Almira Coaster RVS  Examination Guidelines: A complete evaluation includes B-mode imaging, spectral Doppler, color Doppler, and power Doppler as needed of all accessible portions of each vessel. Unilateral testing is considered an integral part of a complete examination. Limited examinations for reoccurring indications may be performed as noted.  Findings:   +--------------------+----------+-----------------+--------+ AVG                 PSV (cm/s)Flow Vol (  mL/min)Describe +--------------------+----------+-----------------+--------+ Native artery inflow   158          1149                 +--------------------+----------+-----------------+--------+ Arterial anastomosis   283                              +--------------------+----------+-----------------+--------+ Prox graft             349                              +--------------------+----------+-----------------+--------+ Mid graft              178                              +--------------------+----------+-----------------+--------+ Distal graft           143                              +--------------------+----------+-----------------+--------+ Venous anastomosis     103                              +--------------------+----------+-----------------+--------+ Venous outflow          62                              +--------------------+----------+-----------------+--------+ +--------------+-------------+---------+---------+----------+------------------+               Diameter (cm)  Depth  BranchingPSV (cm/s)   Flow Volume                                  (cm)                           (ml/min)      +--------------+-------------+---------+---------+----------+------------------+ Lt Rad Art                                       60                       Dist                                                                      +--------------+-------------+---------+---------+----------+------------------+  Summary: The Left Brachial Axillary AVG appears to be patent throughout, Flow Volume appears to be Normal. Known Chronic Stricture still present; Elevated Velocities seen in the Mid/Distal segment of 453cm/s. Diameter changes seen in the Proximal/MId and Mid/Distal segment of the AVG.  *See table(s) above for measurements and observations.  Diagnosing physician: Hortencia Pilar MD Electronically signed by Hortencia Pilar MD on 04/16/2020 at 4:29:37 PM.    --------------------------------------------------------------------------------   Final       Subjective: Pt says she feels  much  better today.  She tolerated HD well and tolerated 2 units PRBC well.  Pt wants to go home.     Discharge Exam: Vitals:   05/07/20 0215 05/07/20 0627  BP: (!) 172/70 (!) 169/57  Pulse: 78 72  Resp: 18 18  Temp: 98 F (36.7 C) 98.6 F (37 C)  SpO2:  100%   Vitals:   05/07/20 0200 05/07/20 0215 05/07/20 0500 05/07/20 0627  BP: (!) 166/71 (!) 172/70  (!) 169/57  Pulse: 77 78  72  Resp:  18  18  Temp:  98 F (36.7 C)  98.6 F (37 C)  TempSrc:  Oral  Oral  SpO2:    100%  Weight:   62.5 kg   Height:       General: Pt is alert, awake, not in acute distress Cardiovascular: normal S1/S2 +, no rubs, no gallops Respiratory: CTA bilaterally, no wheezing, no rhonchi Abdominal: Soft, NT, ND, bowel sounds + Extremities: no edema, no cyanosis   The results of significant diagnostics from this hospitalization (including imaging, microbiology, ancillary and laboratory) are listed below for reference.     Microbiology: Recent Results (from the past 240 hour(s))  Respiratory Panel by RT PCR (Flu A&B, Covid) - Nasopharyngeal Swab     Status: None   Collection Time: 05/06/20  2:38 PM   Specimen: Nasopharyngeal Swab  Result Value Ref Range Status   SARS Coronavirus 2 by RT PCR NEGATIVE NEGATIVE Final    Comment: (NOTE) SARS-CoV-2 target nucleic acids are NOT DETECTED.  The SARS-CoV-2 RNA is generally detectable in upper respiratoy specimens during the acute phase of infection. The lowest concentration of SARS-CoV-2 viral copies this assay can detect is 131 copies/mL. A negative result does not preclude SARS-Cov-2 infection and should not be used as the sole basis for treatment or other patient management decisions. A negative result may occur with  improper specimen collection/handling, submission of specimen other than nasopharyngeal swab, presence of viral mutation(s) within the areas targeted by this assay, and inadequate number of viral copies (<131 copies/mL). A negative  result must be combined with clinical observations, patient history, and epidemiological information. The expected result is Negative.  Fact Sheet for Patients:  PinkCheek.be  Fact Sheet for Healthcare Providers:  GravelBags.it  This test is no t yet approved or cleared by the [place] FDA and  has been authorized for detection and/or diagnosis of SARS-CoV-2 by FDA under an Emergency Use Authorization (EUA). This EUA will remain  in effect (meaning this test can be used) for the duration of the COVID-19 declaration under Section 564(b)(1) of the Act, 21 U.S.C. section 360bbb-3(b)(1), unless the authorization is terminated or revoked sooner.     Influenza A by PCR NEGATIVE NEGATIVE Final   Influenza B by PCR NEGATIVE NEGATIVE Final    Comment: (NOTE) The Xpert Xpress SARS-CoV-2/FLU/RSV assay is intended as an aid in  the diagnosis of influenza from Nasopharyngeal swab specimens and  should not be used as a sole basis for treatment. Nasal washings and  aspirates are unacceptable for Xpert Xpress SARS-CoV-2/FLU/RSV  testing.  Fact Sheet for Patients: PinkCheek.be  Fact Sheet for Healthcare Providers: GravelBags.it  This test is not yet approved or cleared by the [place] FDA and  has been authorized for detection and/or diagnosis of SARS-CoV-2 by  FDA under an Emergency Use Authorization (EUA). This EUA will remain  in effect (meaning this test can be used) for the duration of the  Covid-19 declaration under Section  564(b)(1) of the Act, 21  U.S.C. section 360bbb-3(b)(1), unless the authorization is  terminated or revoked. Performed at [facility], [address], [place], [place] [address]      Labs: BNP (last 3 results) No results for input(s): BNP in the last 8760 hours. Basic Metabolic Panel: Recent Labs  Lab 05/06/20 1319 05/07/20 0541  NA  142 136  K 4.4 3.5  CL 102 95*  CO2 24 29  GLUCOSE 100* 80  BUN 50* 27*  CREATININE 7.85* 4.79*  CALCIUM 7.3* 7.6*  MG  --  1.7  PHOS  --  3.0   Liver Function Tests: Recent Labs  Lab 05/06/20 1319  AST 5*  ALT 8  ALKPHOS 87  BILITOT 0.4  PROT 6.4*  ALBUMIN 3.0*   No results for input(s): LIPASE, AMYLASE in the last 168 hours. No results for input(s): AMMONIA in the last 168 hours. CBC: Recent Labs  Lab 05/06/20 1319 05/07/20 0541  WBC 6.3 7.8  NEUTROABS 4.6  --   HGB 6.9* 9.4*  HCT 22.8* 29.7*  MCV 120.6* 102.1*  PLT 297 263   Cardiac Enzymes: No results for input(s): CKTOTAL, CKMB, CKMBINDEX, TROPONINI in the last 168 hours. BNP: Invalid input(s): POCBNP CBG: No results for input(s): GLUCAP in the last 168 hours. D-Dimer No results for input(s): DDIMER in the last 72 hours. Hgb A1c No results for input(s): HGBA1C in the last 72 hours. Lipid Profile No results for input(s): CHOL, HDL, LDLCALC, TRIG, CHOLHDL, LDLDIRECT in the last 72 hours. Thyroid function studies No results for input(s): TSH, T4TOTAL, T3FREE, THYROIDAB in the last 72 hours.  Invalid input(s): FREET3 Anemia work up Recent Labs    05/06/20 1319  VITAMINB12 294  FOLATE 37.0  FERRITIN 651*  TIBC 215*  IRON 68  RETICCTPCT 5.6*   Urinalysis    Component Value Date/Time   COLORURINE YELLOW 12/16/2012 1919   APPEARANCEUR CLOUDY (A) 12/16/2012 1919   LABSPEC 1.009 12/16/2012 1919   PHURINE 7.5 12/16/2012 1919   GLUCOSEU NEGATIVE 12/16/2012 1919   HGBUR TRACE (A) 12/16/2012 1919   BILIRUBINUR NEGATIVE 12/16/2012 1919   KETONESUR NEGATIVE 12/16/2012 1919   PROTEINUR 100 (A) 12/16/2012 1919   UROBILINOGEN 0.2 12/16/2012 1919   NITRITE NEGATIVE 12/16/2012 1919   LEUKOCYTESUR SMALL (A) 12/16/2012 1919   Sepsis Labs Invalid input(s): PROCALCITONIN,  WBC,  LACTICIDVEN Microbiology Recent Results (from the past 240 hour(s))  Respiratory Panel by RT PCR (Flu A&B, Covid) -  Nasopharyngeal Swab     Status: None   Collection Time: 05/06/20  2:38 PM   Specimen: Nasopharyngeal Swab  Result Value Ref Range Status   SARS Coronavirus 2 by RT PCR NEGATIVE NEGATIVE Final    Comment: (NOTE) SARS-CoV-2 target nucleic acids are NOT DETECTED.  The SARS-CoV-2 RNA is generally detectable in upper respiratoy specimens during the acute phase of infection. The lowest concentration of SARS-CoV-2 viral copies this assay can detect is 131 copies/mL. A negative result does not preclude SARS-Cov-2 infection and should not be used as the sole basis for treatment or other patient management decisions. A negative result may occur with  improper specimen collection/handling, submission of specimen other than nasopharyngeal swab, presence of viral mutation(s) within the areas targeted by this assay, and inadequate number of viral copies (<131 copies/mL). A negative result must be combined with clinical observations, patient history, and epidemiological information. The expected result is Negative.  Fact Sheet for Patients:  PinkCheek.be  Fact Sheet for Healthcare Providers:  GravelBags.it  This test is no t yet approved or cleared by the [place] and  has been authorized for detection and/or diagnosis of SARS-CoV-2 by FDA under an Emergency Use Authorization (EUA). This EUA will remain  in effect (meaning this test can be used) for the duration of the COVID-19 declaration under Section 564(b)(1) of the Act, 21 U.S.C. section 360bbb-3(b)(1), unless the authorization is terminated or revoked sooner.     Influenza A by PCR NEGATIVE NEGATIVE Final   Influenza B by PCR NEGATIVE NEGATIVE Final    Comment: (NOTE) The Xpert Xpress SARS-CoV-2/FLU/RSV assay is intended as an aid in  the diagnosis of influenza from Nasopharyngeal swab specimens and  should not be used as a sole basis for treatment. Nasal washings and   aspirates are unacceptable for Xpert Xpress SARS-CoV-2/FLU/RSV  testing.  Fact Sheet for Patients: PinkCheek.be  Fact Sheet for Healthcare Providers: GravelBags.it  This test is not yet approved or cleared by the [place] FDA and  has been authorized for detection and/or diagnosis of SARS-CoV-2 by  FDA under an Emergency Use Authorization (EUA). This EUA will remain  in effect (meaning this test can be used) for the duration of the  Covid-19 declaration under Section 564(b)(1) of the Act, 21  U.S.C. section 360bbb-3(b)(1), unless the authorization is  terminated or revoked. Performed at [facility], [address] [place], [place] [address]    Time coordinating discharge:   SIGNED:  Irwin Brakeman, MD  [facility] 05/07/2020, 11:25 AM How to contact the [facility] Attending or Consulting provider [place] or covering provider during after hours [place], for this patient?  1. Check the care team in [facility] and look for a) attending/consulting [facility] provider listed and b) the [facility] team listed 2. Log into [email or web address] and use [place] universal password to access. If you do not have the password, please contact the hospital operator. 3. Locate the [facility] provider you are looking for under [facility] and page to a number that you can be directly reached. 4. If you still have difficulty reaching the provider, please page the [facility] (Director on Call) for the Hospitalists listed on amion for assistance.

## 2020-05-07 NOTE — Procedures (Signed)
     HEMODIALYSIS TREATMENT NOTE:  Uneventful 3 hour HD session completed via LUE AVG (15g/antegrade).  Two units pRBC transfused during dialysis.  UF was paused x 15 minutes at pt's request for c/o leg cramps.  All blood was returned and hemostasis was achieved in 15 minutes.  No changes from pre-dialysis assessment.  Treatment balance:  NS  +500cc pRBC  +630cc UF removed -2739cc _______________________  NET UF 1609 cc   Rockwell Alexandria, RN

## 2020-05-07 NOTE — Discharge Instructions (Signed)
Anemia  Anemia is a condition in which you do not have enough red blood cells or hemoglobin. Hemoglobin is a substance in red blood cells that carries oxygen. When you do not have enough red blood cells or hemoglobin (are anemic), your body cannot get enough oxygen and your organs may not work properly. As a result, you may feel very tired or have other problems. What are the causes? Common causes of anemia include:  Excessive bleeding. Anemia can be caused by excessive bleeding inside or outside the body, including bleeding from the intestine or from periods in women.  Poor nutrition.  Long-lasting (chronic) kidney, thyroid, and liver disease.  Bone marrow disorders.  Cancer and treatments for cancer.  HIV (human immunodeficiency virus) and AIDS (acquired immunodeficiency syndrome).  Treatments for HIV and AIDS.  Spleen problems.  Blood disorders.  Infections, medicines, and autoimmune disorders that destroy red blood cells. What are the signs or symptoms? Symptoms of this condition include:  Minor weakness.  Dizziness.  Headache.  Feeling heartbeats that are irregular or faster than normal (palpitations).  Shortness of breath, especially with exercise.  Paleness.  Cold sensitivity.  Indigestion.  Nausea.  Difficulty sleeping.  Difficulty concentrating. Symptoms may occur suddenly or develop slowly. If your anemia is mild, you may not have symptoms. How is this diagnosed? This condition is diagnosed based on:  Blood tests.  Your medical history.  A physical exam.  Bone marrow biopsy. Your health care provider may also check your stool (feces) for blood and may do additional testing to look for the cause of your bleeding. You may also have other tests, including:  Imaging tests, such as a CT scan or MRI.  Endoscopy.  Colonoscopy. How is this treated? Treatment for this condition depends on the cause. If you continue to lose a lot of blood, you may  need to be treated at a hospital. Treatment may include:  Taking supplements of iron, vitamin S31, or folic acid.  Taking a hormone medicine (erythropoietin) that can help to stimulate red blood cell growth.  Having a blood transfusion. This may be needed if you lose a lot of blood.  Making changes to your diet.  Having surgery to remove your spleen. Follow these instructions at home:  Take over-the-counter and prescription medicines only as told by your health care provider.  Take supplements only as told by your health care provider.  Follow any diet instructions that you were given.  Keep all follow-up visits as told by your health care provider. This is important. Contact a health care provider if:  You develop new bleeding anywhere in the body. Get help right away if:  You are very weak.  You are short of breath.  You have pain in your abdomen or chest.  You are dizzy or feel faint.  You have trouble concentrating.  You have bloody or black, tarry stools.  You vomit repeatedly or you vomit up blood. Summary  Anemia is a condition in which you do not have enough red blood cells or enough of a substance in your red blood cells that carries oxygen (hemoglobin).  Symptoms may occur suddenly or develop slowly.  If your anemia is mild, you may not have symptoms.  This condition is diagnosed with blood tests as well as a medical history and physical exam. Other tests may be needed.  Treatment for this condition depends on the cause of the anemia. This information is not intended to replace advice given to you by  your health care provider. Make sure you discuss any questions you have with your health care provider. Document Revised: 06/09/2017 Document Reviewed: 07/29/2016 Elsevier Patient Education  [place].   IMPORTANT INFORMATION: PAY CLOSE ATTENTION   PHYSICIAN DISCHARGE INSTRUCTIONS  Follow with Primary care provider  [name], MD  and other  consultants as instructed by your Hospitalist Physician  [place] IF SYMPTOMS COME BACK, WORSEN OR NEW PROBLEM DEVELOPS   Please note: You were cared for by a hospitalist during your hospital stay. Every effort will be made to forward records to your primary care provider.  You can request that your primary care provider send for your hospital records if they have not received them.  Once you are discharged, your primary care physician will handle any further medical issues. Please note that NO REFILLS for any discharge medications will be authorized once you are discharged, as it is imperative that you return to your primary care physician (or establish a relationship with a primary care physician if you do not have one) for your post hospital discharge needs so that they can reassess your need for medications and monitor your lab values.  Please get a complete blood count and chemistry panel checked by your Primary MD at your next visit, and again as instructed by your Primary MD.  Get Medicines reviewed and adjusted: Please take all your medications with you for your next visit with your Primary MD  Laboratory/radiological data: Please request your Primary MD to go over all hospital tests and procedure/radiological results at the follow up, please ask your primary care provider to get all Hospital records sent to his/her office.  In some cases, they will be blood work, cultures and biopsy results pending at the time of your discharge. Please request that your primary care provider follow up on these results.  If you are diabetic, please bring your blood sugar readings with you to your follow up appointment with primary care.    Please call and make your follow up appointments as soon as possible.    Also Note the following: If you experience worsening of your admission symptoms, develop shortness of breath, life threatening emergency, suicidal or  homicidal thoughts you must seek medical attention immediately by calling 911 or calling your MD immediately  if symptoms less severe.  You must read complete instructions/literature along with all the possible adverse reactions/side effects for all the Medicines you take and that have been prescribed to you. Take any new Medicines after you have completely understood and accpet all the possible adverse reactions/side effects.   Do not drive when taking Pain medications or sleeping medications (Benzodiazepines)  Do not take more than prescribed Pain, Sleep and Anxiety Medications. It is not advisable to combine anxiety,sleep and pain medications without talking with your primary care practitioner  Special Instructions: If you have smoked or chewed Tobacco  in the last 2 yrs please stop smoking, stop any regular Alcohol  and or any Recreational drug use.  Wear Seat belts while driving.  Do not drive if taking any narcotic, mind altering or controlled substances or recreational drugs or alcohol.

## 2020-05-07 NOTE — Progress Notes (Signed)
S: Feels well and tolerated blood transfusion and dialysis well overnight. O:BP (!) 169/57 (BP Location: Right Arm)   Pulse 72   Temp 98.6 F (37 C) (Oral)   Resp 18   Ht 5\' 1"  (1.549 m)   Wt 62.5 kg   SpO2 100%   BMI 26.03 kg/m   Intake/Output Summary (Last 24 hours) at 05/07/2020 1156 Last data filed at 05/07/2020 0200 Gross per 24 hour  Intake 630 ml  Output 1609 ml  Net -979 ml   Intake/Output: I/O last 3 completed shifts: In: [place]: 1609 [[id number]]  Intake/Output this shift:  No intake/output data recorded. Weight change:  Gen:NAD CVS: RRR Resp: cta Abd: benign Ext: no edema, LUE AVG +T/B  Recent Labs  Lab 05/06/20 1319 05/07/20 0541  NA 142 136  K 4.4 3.5  CL 102 95*  CO2 24 29  GLUCOSE 100* 80  BUN 50* 27*  CREATININE 7.85* 4.79*  ALBUMIN 3.0*  --   CALCIUM 7.3* 7.6*  PHOS  --  3.0  AST 5*  --   ALT 8  --    Liver Function Tests: Recent Labs  Lab 05/06/20 1319  AST 5*  ALT 8  ALKPHOS 87  BILITOT 0.4  PROT 6.4*  ALBUMIN 3.0*   No results for input(s): LIPASE, AMYLASE in the last 168 hours. No results for input(s): AMMONIA in the last 168 hours. CBC: Recent Labs  Lab 05/06/20 1319 05/07/20 0541  WBC 6.3 7.8  NEUTROABS 4.6  --   HGB 6.9* 9.4*  HCT 22.8* 29.7*  MCV 120.6* 102.1*  PLT 297 263   Cardiac Enzymes: No results for input(s): CKTOTAL, CKMB, CKMBINDEX, TROPONINI in the last 168 hours. CBG: No results for input(s): GLUCAP in the last 168 hours.  Iron Studies:  Recent Labs    05/06/20 1319  IRON 68  TIBC 215*  FERRITIN 651*   Studies/Results: No results found. . sodium chloride   Intravenous Once  . sodium chloride   Intravenous Once  . amiodarone  200 mg Oral Daily  . aspirin EC  81 mg Oral Daily  . Chlorhexidine Gluconate Cloth  6 each Topical Q0600  . dicyclomine  10 mg Oral TID AC & HS  . gabapentin  100 mg Oral QHS  . isosorbide mononitrate  60 mg Oral BID  . multivitamin  1 tablet Oral  Daily  . pantoprazole  40 mg Oral Daily  . simvastatin  20 mg Oral QHS    BMET    Component Value Date/Time   NA 136 05/07/2020 0541   K 3.5 05/07/2020 0541   CL 95 (L) 05/07/2020 0541   CO2 29 05/07/2020 0541   GLUCOSE 80 05/07/2020 0541   BUN 27 (H) 05/07/2020 0541   CREATININE 4.79 (H) 05/07/2020 0541   CREATININE 6.57 (H) 03/05/2019 1159   CALCIUM 7.6 (L) 05/07/2020 0541   CALCIUM 8.1 (L) 05/29/2013 1814   GFRNONAA 9 (L) 05/07/2020 0541   GFRNONAA 6 (L) 03/05/2019 1159   GFRAA 9 (L) 02/12/2020 1712   GFRAA 6 (L) 03/05/2019 1159   CBC    Component Value Date/Time   WBC 7.8 05/07/2020 0541   RBC 2.91 (L) 05/07/2020 0541   HGB 9.4 (L) 05/07/2020 0541   HCT 29.7 (L) 05/07/2020 0541   PLT 263 05/07/2020 0541   MCV 102.1 (H) 05/07/2020 0541   MCH 32.3 05/07/2020 0541   MCHC 31.6 05/07/2020 0541   RDW 28.1 (H) 05/07/2020 0541  LYMPHSABS 0.8 05/06/2020 1319   MONOABS 0.5 05/06/2020 1319   EOSABS 0.3 05/06/2020 1319   BASOSABS 0.1 05/06/2020 1319     Assessment/Plan:  1. Symptomatic anemia- from combination of chronic GIB and ESRD.  S/p blood transfusion x 1 with improved Hgb.  F/u with GI 2. ESRD- continue with HD on MWF schedule at her outpatient clinic 3. Disposition- for discharge to home today.  Donetta Potts, MD [employer] [phone]

## 2020-05-07 NOTE — Evaluation (Signed)
Physical Therapy Evaluation Patient Details Name: Shawna [name] MRN: [id number] DOB: [birth year] Today's Date: 05/07/2020   History of Present Illness  Shawna [name] is a 79 y.o. female with medical history significant for chronic GI bleeding secondary to small bowel angiodysplasia as well as colonic polyps who is followed closely by GI and only on aspirin for anticoagulation, ESRD on HD MWF, CAD s/p CABG, unstable angina, multiple hospitalizations for CHF and volume overload, has not produced urine in the past 4 to 5 years, was told by her physician to come to ED due to low blood count.  Pt had been complaining of generalized weakness and fatigue.  Pt denied blood in stool and denied black tarry stool.  She was told her Hg was down to 4 but when checked today it was 6.9.  She was ordered to receive 1 unit PRBC.  Pt missed hemodialysis today.  Pt has had multiple prior transfusions for anemia in the past.  Her anemia is thought to be multifactorial given her end stage renal disease and chronic blood loss and iron deficiency.  Her baseline hemoglobin has been around 9 from multiple prior tests.  She tested hemoccult (guaiac) negative today.   Given that she is having symptoms of anemia she is being transfused 1 unit PRBC and observed in the hospital.   She is denying chest pain symptoms at this time.  Her electrolytes are stable.  She is vaccinated and Sars 2 coronavirus test is negative.    Clinical Impression  Patient functioning near baseline for functional mobility and gait, demonstrates good return for ambulation in room and hallway with slightly labored cadence without loss of balance, limited secondary to fatigue and tolerated sitting up in chair after therapy.  Plan:  Patient discharged from physical therapy to care of nursing for ambulation daily as tolerated for length of stay.     Follow Up Recommendations Home health PT;Supervision - Intermittent    Equipment Recommendations  None  recommended by PT    Recommendations for Other Services       Precautions / Restrictions Precautions Precautions: None Restrictions Weight Bearing Restrictions: No      Mobility  Bed Mobility Overal bed mobility: Modified Independent                  Transfers Overall transfer level: Modified independent                  Ambulation/Gait Ambulation/Gait assistance: Modified independent (Device/Increase time) Gait Distance (Feet): 80 Feet Assistive device: None Gait Pattern/deviations: WFL(Within Functional Limits) Gait velocity: decreased   General Gait Details: demonstrates good return for ambulation in room and hallways without loss of balance  Stairs            Wheelchair Mobility    Modified Rankin (Stroke Patients Only)       Balance Overall balance assessment: No apparent balance deficits (not formally assessed)                                           Pertinent Vitals/Pain Pain Assessment: No/denies pain    Home Living Family/patient expects to be discharged to:: Private residence Living Arrangements: Spouse/significant other Available Help at Discharge: Family;Available 24 hours/day Type of Home: House Home Access: Stairs to enter Entrance Stairs-Rails: Right Entrance Stairs-Number of Steps: 1 Home Layout: One level Home Equipment: Walker - 4  wheels;Cane - single point;Shower seat      Prior Function Level of Independence: Independent with assistive device(s)         Comments: household ambulator without AD most of time, for longer distances uses SPC PRN     Hand Dominance   Dominant Hand: Right    Extremity/Trunk Assessment   Upper Extremity Assessment Upper Extremity Assessment: Overall WFL for tasks assessed    Lower Extremity Assessment Lower Extremity Assessment: Overall WFL for tasks assessed    Cervical / Trunk Assessment Cervical / Trunk Assessment: Normal  Communication    Communication: No difficulties  Cognition Arousal/Alertness: Awake/alert Behavior During Therapy: WFL for tasks assessed/performed Overall Cognitive Status: Within Functional Limits for tasks assessed                                        General Comments      Exercises     Assessment/Plan    PT Assessment All further PT needs can be met in the next venue of care  PT Problem List Decreased strength;Decreased activity tolerance;Decreased mobility       PT Treatment Interventions      PT Goals (Current goals can be found in the Care Plan section)  Acute Rehab PT Goals Patient Stated Goal: return home with family to assist PT Goal Formulation: With patient Time For Goal Achievement: 05/07/20 Potential to Achieve Goals: Good    Frequency     Barriers to discharge        Co-evaluation               AM-PAC PT "6 Clicks" Mobility  Outcome Measure Help needed turning from your back to your side while in a flat bed without using bedrails?: None Help needed moving from lying on your back to sitting on the side of a flat bed without using bedrails?: None Help needed moving to and from a bed to a chair (including a wheelchair)?: None Help needed standing up from a chair using your arms (e.g., wheelchair or bedside chair)?: None Help needed to walk in hospital room?: A Little Help needed climbing 3-5 steps with a railing? : A Little 6 Click Score: 22    End of Session   Activity Tolerance: Patient tolerated treatment well;Patient limited by fatigue Patient left: in chair;with call bell/phone within reach Nurse Communication: Mobility status PT Visit Diagnosis: Unsteadiness on feet (R26.81);Other abnormalities of gait and mobility (R26.89);Muscle weakness (generalized) (M62.81)    Time: 8676-7209 PT Time Calculation (min) (ACUTE ONLY): 15 min   Charges:   PT Evaluation $PT Eval Low Complexity: 1 Low PT Treatments $Therapeutic Activity: 8-22  mins        12:29 PM, 05/07/20 Shawna [name] Grandchild, MPT Physical Therapist with [facility] [phone] office [phone] mobile phone

## 2020-05-08 LAB — TYPE AND SCREEN
ABO/RH(D): O POS
Antibody Screen: NEGATIVE
Donor AG Type: NEGATIVE
Donor AG Type: NEGATIVE
Unit division: 0
Unit division: 0

## 2020-05-08 LAB — BPAM RBC
Blood Product Expiration Date: 202112022359
Blood Product Expiration Date: 202112022359
ISSUE DATE / TIME: 202110272358
ISSUE DATE / TIME: 202110280108
Unit Type and Rh: 5100
Unit Type and Rh: 5100

## 2020-05-12 ENCOUNTER — Telehealth: Payer: Self-pay | Admitting: Internal Medicine

## 2020-05-12 NOTE — Telephone Encounter (Signed)
Looks like Shawna [name] already gave results of cxr on 05/05/2020  Called the pt and there was no answer- LMTCB

## 2020-05-14 ENCOUNTER — Telehealth: Payer: Self-pay | Admitting: Family Medicine

## 2020-05-14 NOTE — Telephone Encounter (Signed)
Advised that BP 111/60 is not tool low. Advised that she could try breaking imdur in half twice daily, continue monitoring symptoms and BP, and contact our office if her symptoms don't improve. Verbalized understanding of plan.

## 2020-05-14 NOTE — Telephone Encounter (Signed)
Called and spoke to pt. Pt states she does not remember getting her CXR results. Informed her of the results per MW. Pt verbalized understanding and denied any further questions or concerns at this time.

## 2020-05-14 NOTE — Telephone Encounter (Signed)
Per phone call from pt- called stating her BP is dropping and she was in the ER last night at [facility], stated last night it dropped down to 60 something.   This morning she was in the bathroom and started feeling funny and she went to check her BP and it was 111/ 60 something she thinks.   Please call [phone]

## 2020-05-28 ENCOUNTER — Ambulatory Visit (INDEPENDENT_AMBULATORY_CARE_PROVIDER_SITE_OTHER): Payer: Medicare HMO | Admitting: Gastroenterology

## 2020-06-02 ENCOUNTER — Ambulatory Visit (INDEPENDENT_AMBULATORY_CARE_PROVIDER_SITE_OTHER): Payer: Medicare HMO | Admitting: Gastroenterology

## 2020-06-02 ENCOUNTER — Other Ambulatory Visit: Payer: Self-pay

## 2020-06-02 ENCOUNTER — Encounter (INDEPENDENT_AMBULATORY_CARE_PROVIDER_SITE_OTHER): Payer: Self-pay | Admitting: Gastroenterology

## 2020-06-02 VITALS — BP 104/48 | HR 72 | Temp 98.2°F | Ht 60.0 in | Wt 138.0 lb

## 2020-06-02 DIAGNOSIS — K219 Gastro-esophageal reflux disease without esophagitis: Secondary | ICD-10-CM

## 2020-06-02 DIAGNOSIS — D509 Iron deficiency anemia, unspecified: Secondary | ICD-10-CM | POA: Diagnosis not present

## 2020-06-02 NOTE — Progress Notes (Signed)
Patient profile: Shawna [name] is a 79 y.o. female seen for follow up of IDA and abd pain.     History of Present Illness: Shawna [name] is seen today for follow up - since last OV in October 2021 she was found to have a low hemoglobin at 6.9 and had a transfusion, her baseline hemoglobin is around 9.3. She reports that dialysis has monitored Hgb which has continued to be stable.  Last seen in clinic August 2021, has past medical history of GERD with chronic GI bleed secondary to small bowel angiodysplasias.  She was also treated for SIBO in March 2021. At that visit she was started on dicyclomine twice daily.  Seen today for follow-up.   She reports overall doing well since last visit.  She had some issues with constipation but took a detox tea her daughter recommended last week which ultimately caused diarrhea and is now feeling better and having a bowel movement every day or every other day.  She denies any chronic abdominal pain.  Dicyclomine is on her medication list 3 times a day and feels that she takes this most days TID.  She denies any blood in stool or dark stools.  Very rare GERD symptoms on omeprazole 40 mg once a day.  When she has breakthrough symptoms will use Tums or soda water which helps.  Denies nausea vomiting or dysphagia.  She also had a lot of fatigue and shortness of breath with weakness prior to her last transfusion but is feeling better.  Per patient her hemoglobin has stayed normal and is monitored at each dialysis session.  Wt Readings from Last 3 Encounters:  06/02/20 138 lb (62.6 kg)  05/07/20 137 lb 12.6 oz (62.5 kg)  04/16/20 134 lb (60.8 kg)    Last Colonoscopy: August 2020 colonoscopy-diverticulosis entire colon, end-to-side ileocolonic anastomosis, 2 small polyps proximal ascending, large polyp transverse.   Last Endoscopy: 09/2019-The examined esophagus was normal. The Z-line was regular and was found 34 cm from the incisors. A 3 cm hiatal hernia was  present. The entire examined stomach was normal. The duodenal bulb was normal. A single diminutive sessile polyp was found in the second portion of the duodenum. Biopsies were taken with a cold forceps for histology. The third portion of the duodenum was normal   CAPSULE - 03/09/19- Bulbar duodenitis without stigmata of bleed. 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     Past Medical History:  Past Medical History:  Diagnosis Date  . Acute on chronic respiratory failure with hypoxia ([place]) 10/10/2016  . Anxiety   . Arthritis   . AVM (arteriovenous malformation) of colon   . CAD (coronary artery disease)    a. s/p CABG in 2013 b. DES to D1 in 10/2016. c. cath in 07/2018 showing patent grafts with occlusion of D1 at prior stent site and progression of PDA disease --> medical management recommended  . Carotid artery disease ([place])    a. 31-54% LICA, 0/0867   . Chronic anemia   . Chronic bronchitis ([place])   . Chronic diastolic CHF (congestive heart failure) ([place])    a. 02/2012 Echo EF 60-65%, nl wall motion, Gr 1 DD, mod MR  . Colon cancer ([place]) 1992  . Esophageal stricture   . ESRD on hemodialysis ([place])    ESRD due to HTN, started dialysis 2011 and gets HD at [facility] with Dr Hinda Lenis on MWF schedule.  Access is LUA AVF  as of Sept 2014.   Marland Kitchen GERD (gastroesophageal reflux disease)   . High cholesterol 12/2011  . History of blood transfusion 07/2011; 12/2011; 01/2012 X 2; 04/2012  . History of gout   . History of lower GI bleeding   . Hypertension   . Iron deficiency anemia   . Mitral regurgitation    a. Moderate by echo, 02/2012  . Myocardial infarction ([place])   . NSVT (nonsustained ventricular tachycardia) ([place])   . Ovarian cancer ([place]) 1992  . PAF (paroxysmal atrial fibrillation) ([place])   . Pneumonia ~ 2009  . PUD (peptic ulcer disease)   . TIA (transient ischemic attack)     Problem List: Patient Active Problem List    Diagnosis Date Noted  . Irritable bowel syndrome 02/25/2020  . Pulmonary edema 10/14/2019  . Adenomatous duodenal polyp 09/10/2019  . History of GI bleed 09/10/2019  . Angina pectoris ([place]) 06/05/2019  . Chest pain 06/03/2019  . Rectal bleeding 05/14/2019  . Small intestinal bacterial overgrowth 05/14/2019  . Iron deficiency anemia 04/02/2019  . GI bleed 03/08/2019  . Gastrointestinal hemorrhage with melena 03/06/2019  . Acute respiratory failure with hypoxia ([place]) 12/25/2018  . Elevated troponin 12/14/2018  . Chest pain at rest 07/13/2018  . Hand steal syndrome ([place]) 08/01/2017  . Macrocytic anemia 07/14/2017  . Coronary artery disease of bypass graft of native heart with stable angina pectoris ([place]) 06/05/2017  . Mesenteric ischemia ([place])   . Diverticulitis   . SBO (small bowel obstruction) ([place]) 03/22/2017  . Enteritis   . Complication of vascular access for dialysis 03/19/2017  . Preoperative clearance 01/25/2017  . Symptomatic anemia 10/24/2016  . H/O non-ST elevation myocardial infarction (NSTEMI) 10/24/2016  . Fluid overload 10/10/2016  . Complication from renal dialysis device 10/10/2016  . Non-ST elevation MI (NSTEMI) ([place])   . Encounter for fitting and adjustment of vascular catheter   . Heme positive stool   . Demand ischemia ([place]) 07/27/2016  . Hypertensive emergency 07/08/2016  . Acute on chronic respiratory failure with hypoxia ([place])   . Cardiac arrest ([place])   . Palliative care encounter   . Goals of care, counseling/discussion   . Hypertensive crisis without congestive heart failure 05/09/2016  . Acute pulmonary edema ([place]) 04/06/2016  . Acute respiratory failure ([place]) 04/06/2016  . Hypertensive crisis 01/27/2016  . History of colon cancer 01/27/2016  . History of ovarian cancer 01/27/2016  . Hypertensive urgency 01/27/2016  . AF (paroxysmal atrial fibrillation) ([place]) 10/14/2015  . Coronary angioplasty status 10/14/2015  . Malignant neoplasm of right  ovary ([place]) 10/14/2015  . Narrow complex tachycardia ([place]) 09/08/2015  . SVT (supraventricular tachycardia) ([place]) 09/08/2015  . Influenza A 08/30/2015  . Acute on chronic diastolic CHF (congestive heart failure) ([place]) 05/04/2015  . Unstable angina ([place]) 05/03/2015  . DOE (dyspnea on exertion)   . Essential hypertension   . Pain in joint, lower leg 08/14/2014  . Dacryocystitis 05/29/2013  . Chronic diastolic CHF (congestive heart failure) ([place]) 03/22/2013  . GI bleeding 03/21/2013  . Acute blood loss anemia 03/21/2013  . Vaginal odor 03/12/2013  . Vaginal discharge 03/12/2013  . Occlusion and stenosis of carotid artery without mention of cerebral infarction 01/24/2013  . Hx of CABG 07/05/2012  . Carotid artery disease ([place]) 07/05/2012  . Mitral regurgitation 06/12/2012  . Pneumonia 06/09/2012  . Non-STEMI (non-ST elevated myocardial infarction) ([place]) 06/08/2012  . Ischemic chest pain ([place]) 03/01/2012  . 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 GERD (  gastroesophageal reflux disease) 01/09/2012  . HLD (hyperlipidemia) 01/05/2012  . Atherosclerotic heart disease of native coronary artery without angina pectoris 12/16/2011  . Essential hypertension, benign 12/16/2011  . ESRD on hemodialysis ([place]) 12/16/2011  . Anxiety disorder 05/04/2011  . Anemia in chronic kidney disease 04/29/2011  . Secondary hyperparathyroidism of renal origin ([place]) 04/29/2011  . End stage renal disease ([place]) 04/29/2011  . Gout 04/29/2011  . Hypertensive chronic kidney disease with stage 5 chronic kidney disease or end stage renal disease ([place]) 04/29/2011    Past Surgical History: Past Surgical History:  Procedure Laterality Date  . A/V SHUNTOGRAM Left 03/19/2019   Procedure: A/V SHUNTOGRAM;  Surgeon: Katha Cabal, MD;  Location: [place] [facility];  Service: Cardiovascular;  Laterality: Left;  . ABDOMINAL HYSTERECTOMY  1992  . APPENDECTOMY  06/1990  . AV FISTULA  PLACEMENT  07/2009   left upper arm  . AV FISTULA PLACEMENT Right 09/06/2016   Procedure: RIGHT FOREARM ARTERIOVENOUS (AV) GRAFT;  Surgeon: Elam Dutch, MD;  Location: [facility] OR;  Service: Vascular;  Laterality: Right;  . AV FISTULA PLACEMENT N/A 02/24/2017   Procedure: INSERTION OF ARTERIOVENOUS (AV) GORE-TEX GRAFT ARM (BRACHIAL AXILLARY);  Surgeon: Katha Cabal, MD;  Location: [facility] ORS;  Service: Vascular;  Laterality: N/A;  . [place] Right 09/06/2016   Procedure: REMOVAL OF Right Arm ARTERIOVENOUS GORETEX GRAFT and Vein Patch angioplasty of brachial artery;  Surgeon: Angelia Mould, MD;  Location: [place];  Service: Vascular;  Laterality: Right;  . BIOPSY  09/26/2019   Procedure: BIOPSY;  Surgeon: Rogene Houston, MD;  [facility];  Service: Endoscopy;;  . COLON RESECTION  1992  . COLON SURGERY    . COLONOSCOPY N/A 03/09/2019   Procedure: COLONOSCOPY;  Surgeon: Rogene Houston, MD;  [facility];  Service: Endoscopy;  Laterality: N/A;  . CORONARY ANGIOPLASTY WITH STENT PLACEMENT  12/15/11   "2"  . CORONARY ANGIOPLASTY WITH STENT PLACEMENT  y/2013   "1; makes total of 3" (05/02/2012)  . CORONARY ARTERY BYPASS GRAFT  06/13/2012   Procedure: CORONARY ARTERY BYPASS GRAFTING (CABG);  Surgeon: Grace Isaac, MD;  Location: [place];  Service: Open Heart Surgery;  Laterality: N/A;  cabg x four;  using left internal mammary artery, and left leg greater saphenous vein harvested endoscopically  . CORONARY STENT INTERVENTION N/A 10/13/2016   Procedure: Coronary Stent Intervention;  Surgeon: Troy Sine, MD;  Location: [place] [facility];  Service: Cardiovascular;  Laterality: N/A;  . DIALYSIS/PERMA CATHETER REMOVAL N/A 04/18/2017   Procedure: DIALYSIS/PERMA CATHETER REMOVAL;  Surgeon: Katha Cabal, MD;  Location: [place] [facility];  Service: Cardiovascular;  Laterality: N/A;  . DILATION AND CURETTAGE OF UTERUS    . ESOPHAGOGASTRODUODENOSCOPY  01/20/2012    Procedure: ESOPHAGOGASTRODUODENOSCOPY (EGD);  Surgeon: Ladene Artist, MD,FACG;  Location: [facility] ENDOSCOPY;  Service: Endoscopy;  Laterality: N/A;  . ESOPHAGOGASTRODUODENOSCOPY N/A 03/26/2013   Procedure: ESOPHAGOGASTRODUODENOSCOPY (EGD);  Surgeon: Irene Shipper, MD;  Location: [facility] ENDOSCOPY;  Service: Endoscopy;  Laterality: N/A;  . ESOPHAGOGASTRODUODENOSCOPY N/A 04/30/2015   Procedure: ESOPHAGOGASTRODUODENOSCOPY (EGD);  Surgeon: Rogene Houston, MD;  [facility];  Service: Endoscopy;  Laterality: N/A;  1pm - moved to 10/20 @ 1:10  . ESOPHAGOGASTRODUODENOSCOPY N/A 07/29/2016   Procedure: ESOPHAGOGASTRODUODENOSCOPY (EGD);  Surgeon: Manus Gunning, MD;  Location: [place];  Service: Gastroenterology;  Laterality: N/A;  enteroscopy  . ESOPHAGOGASTRODUODENOSCOPY N/A 09/26/2019   Procedure: ESOPHAGOGASTRODUODENOSCOPY (EGD);  Surgeon:  Rogene Houston, MD;  [facility];  Service: Endoscopy;  Laterality: N/A;  1250  . GIVENS CAPSULE STUDY N/A 03/07/2019   Procedure: GIVENS CAPSULE STUDY;  Surgeon: Rogene Houston, MD;  [facility];  Service: Endoscopy;  Laterality: N/A;  7:30  . INTRAOPERATIVE TRANSESOPHAGEAL ECHOCARDIOGRAM  06/13/2012   Procedure: INTRAOPERATIVE TRANSESOPHAGEAL ECHOCARDIOGRAM;  Surgeon: Grace Isaac, MD;  Location: [place];  Service: Open Heart Surgery;  Laterality: N/A;  . IR GENERIC HISTORICAL  07/26/2016   IR FLUORO GUIDE CV LINE RIGHT 07/26/2016 Greggory Keen, MD [facility]  . IR GENERIC HISTORICAL  07/26/2016   IR US GUIDE VASC ACCESS RIGHT 07/26/2016 Greggory Keen, MD [facility]  . IR GENERIC HISTORICAL  08/02/2016   IR US GUIDE VASC ACCESS RIGHT 08/02/2016 Greggory Keen, MD [facility]  . IR GENERIC HISTORICAL  08/02/2016   IR FLUORO GUIDE CV LINE RIGHT 08/02/2016 Greggory Keen, MD [facility]  . IR RADIOLOGY PERIPHERAL GUIDED IV START  03/28/2017  . IR US GUIDE VASC ACCESS RIGHT  03/28/2017  . LEFT HEART CATH AND CORONARY  ANGIOGRAPHY N/A 09/20/2016   Procedure: Left Heart Cath and Coronary Angiography;  Surgeon: [name], MD;  Location: [place] [facility];  Service: Cardiovascular;  Laterality: N/A;  . LEFT HEART CATH AND CORS/GRAFTS ANGIOGRAPHY N/A 10/13/2016   Procedure: Left Heart Cath and Cors/Grafts Angiography;  Surgeon: Troy Sine, MD;  Location: [place] [facility];  Service: Cardiovascular;  Laterality: N/A;  . LEFT HEART CATH AND CORS/GRAFTS ANGIOGRAPHY N/A 07/13/2018   Procedure: LEFT HEART CATH AND CORS/GRAFTS ANGIOGRAPHY;  Surgeon: Martinique, Peter M, MD;  Location: [place] [facility];  Service: Cardiovascular;  Laterality: N/A;  . LEFT HEART CATHETERIZATION WITH CORONARY ANGIOGRAM N/A 12/15/2011   Procedure: LEFT HEART CATHETERIZATION WITH CORONARY ANGIOGRAM;  Surgeon: Burnell Blanks, MD;  Location: [facility] CATH LAB;  Service: Cardiovascular;  Laterality: N/A;  . LEFT HEART CATHETERIZATION WITH CORONARY ANGIOGRAM N/A 01/10/2012   Procedure: LEFT HEART CATHETERIZATION WITH CORONARY ANGIOGRAM;  Surgeon: Peter M Martinique, MD;  Location: [facility] CATH LAB;  Service: Cardiovascular;  Laterality: N/A;  . LEFT HEART CATHETERIZATION WITH CORONARY ANGIOGRAM N/A 06/08/2012   Procedure: LEFT HEART CATHETERIZATION WITH CORONARY ANGIOGRAM;  Surgeon: Burnell Blanks, MD;  Location: [facility] CATH LAB;  Service: Cardiovascular;  Laterality: N/A;  . LEFT HEART CATHETERIZATION WITH CORONARY/GRAFT ANGIOGRAM N/A 12/10/2013   Procedure: LEFT HEART CATHETERIZATION WITH Beatrix Fetters;  Surgeon: [name], MD;  Location: [facility] CATH LAB;  Service: Cardiovascular;  Laterality: N/A;  . OVARY SURGERY     ovarian cancer  . POLYPECTOMY  03/09/2019   Procedure: POLYPECTOMY;  Surgeon: Rogene Houston, MD;  [facility];  Service: Endoscopy;;  cecal   . POLYPECTOMY N/A 09/26/2019   Procedure: DUODENAL POLYPECTOMY;  Surgeon: Rogene Houston, MD;  [facility];  Service: Endoscopy;  Laterality: N/A;  .  REVISION OF ARTERIOVENOUS GORETEX GRAFT N/A 02/24/2017   Procedure: REVISION OF ARTERIOVENOUS GORETEX GRAFT (RESECTION);  Surgeon: Katha Cabal, MD;  Location: [facility] ORS;  Service: Vascular;  Laterality: N/A;  Earney Mallet N/A 10/15/2013   Procedure: Fistulogram;  Surgeon: Serafina Mitchell, MD;  Location: [facility] CATH LAB;  Service: Cardiovascular;  Laterality: N/A;  . THROMBECTOMY / ARTERIOVENOUS GRAFT REVISION  2011   left upper arm  . TUBAL LIGATION  1980's  . UPPER EXTREMITY ANGIOGRAPHY Bilateral 12/06/2016   Procedure: Upper Extremity Angiography;  Surgeon: Delana Meyer,  Dolores Lory, MD;  Location: [place] [facility];  Service: Cardiovascular;  Laterality: Bilateral;  . UPPER EXTREMITY INTERVENTION Left 06/06/2017   Procedure: UPPER EXTREMITY INTERVENTION;  Surgeon: Katha Cabal, MD;  Location: [place] [facility];  Service: Cardiovascular;  Laterality: Left;    Allergies: Allergies  Allergen Reactions  . Aspirin Other (See Comments)    High Doses Mess up her stomach; "makes my bowels have blood in them". Takes 81 mg EC Aspirin   . Penicillins Other (See Comments)    SYNCOPE? , "makes me real weak when I take it; like I'll pass out"  Has patient had a PCN reaction causing immediate rash, facial/tongue/throat swelling, SOB or lightheadedness with hypotension: Yes Has patient had a PCN reaction causing severe rash involving mucus membranes or skin necrosis: no Has patient had a PCN reaction that required hospitalization no Has patient had a PCN reaction occurring within the last 10 years: no If all of the above  . Amlodipine Swelling  . Bactrim [Sulfamethoxazole-Trimethoprim] Rash  . Contrast Media [Iodinated Diagnostic Agents] Itching  . Iron Itching and Other (See Comments)    "they gave me iron in dialysis; had to give me Benadryl cause I had to have the iron" (05/02/2012)  . Nitrofurantoin Hives  . Tylenol [Acetaminophen] Itching and Other (See Comments)    Makes her feet on fire  per pt  . Gabapentin Other (See Comments)    Unknown reaction  . Hydralazine Itching  . Dexilant [Dexlansoprazole] Other (See Comments)    Upset stomach  . Levaquin [Levofloxacin In D5w] Rash  . Morphine And Related Itching and Other (See Comments)    Itching in feet  . Plavix [Clopidogrel Bisulfate] Rash  . Protonix [Pantoprazole Sodium] Rash  . Venofer [Ferric Oxide] Itching and Other (See Comments)    Patient reports using Benadryl prior to doses as [place] Medications:  Current Outpatient Medications:  .  ALPRAZolam (XANAX) 0.5 MG tablet, Take 0.5 mg by mouth at bedtime as needed for anxiety., Disp: , Rfl:  .  amiodarone (PACERONE) 200 MG tablet, Take 1 tablet (200 mg total) by mouth daily. Starting July 2, Disp: 90 tablet, Rfl: 3 .  aspirin EC 81 MG tablet, Take 81 mg by mouth daily. , Disp:  , Rfl:  .  cyclobenzaprine (FLEXERIL) 5 MG tablet, Take 5 mg by mouth 3 (three) times daily as needed., Disp: , Rfl:  .  dicyclomine (BENTYL) 10 MG capsule, Take 10 mg by mouth 4 (four) times daily -  before meals and at bedtime., Disp: , Rfl:  .  gabapentin (NEURONTIN) 100 MG capsule, Take 100 mg by mouth daily. , Disp: , Rfl:  .  isosorbide mononitrate (IMDUR) 60 MG 24 hr tablet, Take 1 tablet (60 mg total) by mouth 2 (two) times daily., Disp: 60 tablet, Rfl: 6 .  lidocaine-prilocaine (EMLA) cream, Apply 1 application topically every Monday, Wednesday, and Friday. Prior to dialysis, Disp: , Rfl:  .  loratadine (CLARITIN) 10 MG tablet, Take 1 tablet (10 mg total) by mouth daily as needed for allergies., Disp: 30 tablet, Rfl: 0 .  multivitamin (RENA-VIT) TABS tablet, Take 1 tablet by mouth daily., Disp: , Rfl:  .  nitroGLYCERIN (NITROSTAT) 0.4 MG SL tablet, Place 1 tablet (0.4 mg total) under the tongue every 5 (five) minutes as needed for chest pain., Disp: 15 tablet, Rfl: 1 .  nystatin-triamcinolone (MYCOLOG II) cream, Apply 1 application topically 2 (two)  times daily.,  Disp: 30 g, Rfl: 0 .  omeprazole (PRILOSEC) 40 MG capsule, Take 1 capsule (40 mg total) by mouth daily before breakfast., Disp: 30 capsule, Rfl: 5 .  ondansetron (ZOFRAN-ODT) 4 MG disintegrating tablet, Take 4 mg by mouth every 8 (eight) hours as needed for nausea or vomiting. , Disp: , Rfl:  .  simvastatin (ZOCOR) 20 MG tablet, TAKE 1 TABLET BY MOUTH ONCE DAILY AT BEDTIME (Patient taking differently: Take 20 mg by mouth at bedtime. ), Disp: 90 tablet, Rfl: 2 .  fluticasone (FLONASE) 50 MCG/ACT nasal spray, Place 1 spray at bedtime as needed into both nostrils for allergies.  (Patient not taking: Reported on 06/02/2020), Disp: , Rfl:    Family History: family history includes Colon cancer (age of onset: 72) in her child; Diabetes in her brother, father, mother, and sister; Heart attack in her brother, brother, and mother; Heart disease in her father and mother; Hyperlipidemia in her brother, father, and mother; Hypertension in her father, mother, sister, and sister; Other in an other family member.    Social History:   reports that she has never smoked. She has never used smokeless tobacco. She reports that she does not drink alcohol and does not use drugs.   Review of Systems: Constitutional: Denies weight loss/weight gain  Eyes: No changes in vision. ENT: No oral lesions, sore throat.  GI: see HPI.  Heme/Lymph: No easy bruising.  CV: No chest pain.  GU: No hematuria.  Integumentary: No rashes.  Neuro: No headaches.  Psych: No depression/anxiety.  Endocrine: No heat/cold intolerance.  Allergic/Immunologic: No urticaria.  Resp: No cough, SOB.  Musculoskeletal: No joint swelling.    Physical Examination: BP (!) 104/48 (BP Location: Left Arm, Patient Position: Sitting, Cuff Size: Large)   Pulse 72   Temp 98.2 F (36.8 C) (Oral)   Ht 5' (1.524 m)   Wt 138 lb (62.6 kg)   BMI 26.95 kg/m  Gen: NAD, alert and oriented x 4 HEENT: PEERLA, EOMI, Neck: supple, no JVD Chest: CTA  bilaterally, no wheezes, crackles, or other adventitious sounds CV: RRR, no m/g/c/r Abd: soft, NT, ND, +BS in all four quadrants; no HSM, guarding, ridigity, or rebound tenderness Ext: no edema, well perfused with 2+ pulses, Skin: no rash or lesions noted on observed skin Lymph: no noted LAD  Data Reviewed:  05/07/20-Labs reviewed-posttransfusion hemoglobin 9.4.  MCV 102. 05/06/20--Pretransfusion hemoglobin 6.9   Assessment/Plan: Ms. Brutus is a 79 y.o. female seen for routine follow up.   1.  Anemia-did require transfusion of 2 units since her last visit.  She had weakness and fatigue before and feels this is drastically improved.  Reports hemoglobin is staying in the 9 range by dialysis.  No obvious GI bleeding.  Does have a history of jejunal AV malformation on capsule endoscopy last year.  She is to contact me if she needs any further transfusions for anemia and would consider further GI evaluation.  2.  Abdominal pain-previously treated for SIBO earlier this year.  Appears to be doing well on dicyclomine, believes she takes this about 3 times a day.  She will contact me if returns.  History of mild constipation, we discussed taking MiraLAX daily to prevent constipation.  3.  GERD-rare symptoms on omeprazole 40 mg daily, supplements with Tums as needed.  Follow up 6 months - sooner if needed  Pebbles was seen today for follow-up.  Diagnoses and all orders for this visit:  Chronic GERD  Iron deficiency anemia,  unspecified iron deficiency anemia type       I personally performed the service, non-incident to. (WP)  Laurine Blazer, [facility]

## 2020-06-02 NOTE — Patient Instructions (Addendum)
Please call us if you need an additional blood transfusion. Otherwise continue current medications. Continue monitoring for dark stools or blood in stool.

## 2020-06-11 ENCOUNTER — Telehealth (INDEPENDENT_AMBULATORY_CARE_PROVIDER_SITE_OTHER): Payer: Self-pay

## 2020-06-11 NOTE — Telephone Encounter (Signed)
A fax was received regarding the patient having a thrombectomy asap. After speaking with Dr. Lucky Cowboy the schedule tomorrow will not support having another patient added. I attempted to contact [facility] and was only able to leave a message, I then attempted to contact Ramiro Harvest  PA and his voicemail box is not set up. [name] and made her aware of the situation and she was going to attempt to call the center.

## 2020-06-12 ENCOUNTER — Encounter (HOSPITAL_COMMUNITY): Payer: Self-pay

## 2020-06-12 ENCOUNTER — Inpatient Hospital Stay (HOSPITAL_COMMUNITY)
Admission: EM | Admit: 2020-06-12 | Discharge: 2020-06-20 | DRG: 252 | Disposition: A | Discharge status: Home Health | Payer: Medicare HMO | Attending: Family Medicine | Admitting: Family Medicine

## 2020-06-12 ENCOUNTER — Emergency Department (HOSPITAL_COMMUNITY): Payer: Medicare HMO

## 2020-06-12 ENCOUNTER — Other Ambulatory Visit: Payer: Self-pay

## 2020-06-12 DIAGNOSIS — Z90710 Acquired absence of both cervix and uterus: Secondary | ICD-10-CM

## 2020-06-12 DIAGNOSIS — N186 End stage renal disease: Secondary | ICD-10-CM | POA: Diagnosis not present

## 2020-06-12 DIAGNOSIS — Z8249 Family history of ischemic heart disease and other diseases of the circulatory system: Secondary | ICD-10-CM

## 2020-06-12 DIAGNOSIS — F419 Anxiety disorder, unspecified: Secondary | ICD-10-CM | POA: Diagnosis present

## 2020-06-12 DIAGNOSIS — K635 Polyp of colon: Secondary | ICD-10-CM | POA: Diagnosis present

## 2020-06-12 DIAGNOSIS — I132 Hypertensive heart and chronic kidney disease with heart failure and with stage 5 chronic kidney disease, or end stage renal disease: Secondary | ICD-10-CM | POA: Diagnosis present

## 2020-06-12 DIAGNOSIS — Z20822 Contact with and (suspected) exposure to covid-19: Secondary | ICD-10-CM | POA: Diagnosis present

## 2020-06-12 DIAGNOSIS — I5032 Chronic diastolic (congestive) heart failure: Secondary | ICD-10-CM | POA: Diagnosis present

## 2020-06-12 DIAGNOSIS — E785 Hyperlipidemia, unspecified: Secondary | ICD-10-CM | POA: Diagnosis present

## 2020-06-12 DIAGNOSIS — I161 Hypertensive emergency: Secondary | ICD-10-CM | POA: Diagnosis present

## 2020-06-12 DIAGNOSIS — Z91041 Radiographic dye allergy status: Secondary | ICD-10-CM

## 2020-06-12 DIAGNOSIS — Z888 Allergy status to other drugs, medicaments and biological substances status: Secondary | ICD-10-CM

## 2020-06-12 DIAGNOSIS — I10 Essential (primary) hypertension: Secondary | ICD-10-CM | POA: Diagnosis present

## 2020-06-12 DIAGNOSIS — I25810 Atherosclerosis of coronary artery bypass graft(s) without angina pectoris: Secondary | ICD-10-CM | POA: Diagnosis present

## 2020-06-12 DIAGNOSIS — I130 Hypertensive heart and chronic kidney disease with heart failure and stage 1 through stage 4 chronic kidney disease, or unspecified chronic kidney disease: Secondary | ICD-10-CM

## 2020-06-12 DIAGNOSIS — Z955 Presence of coronary angioplasty implant and graft: Secondary | ICD-10-CM

## 2020-06-12 DIAGNOSIS — K5730 Diverticulosis of large intestine without perforation or abscess without bleeding: Secondary | ICD-10-CM | POA: Diagnosis present

## 2020-06-12 DIAGNOSIS — I2510 Atherosclerotic heart disease of native coronary artery without angina pectoris: Secondary | ICD-10-CM | POA: Diagnosis present

## 2020-06-12 DIAGNOSIS — T82818A Embolism of vascular prosthetic devices, implants and grafts, initial encounter: Secondary | ICD-10-CM | POA: Diagnosis not present

## 2020-06-12 DIAGNOSIS — Z880 Allergy status to penicillin: Secondary | ICD-10-CM

## 2020-06-12 DIAGNOSIS — Z8711 Personal history of peptic ulcer disease: Secondary | ICD-10-CM

## 2020-06-12 DIAGNOSIS — Z886 Allergy status to analgesic agent status: Secondary | ICD-10-CM

## 2020-06-12 DIAGNOSIS — D631 Anemia in chronic kidney disease: Secondary | ICD-10-CM | POA: Diagnosis present

## 2020-06-12 DIAGNOSIS — I480 Paroxysmal atrial fibrillation: Secondary | ICD-10-CM | POA: Diagnosis present

## 2020-06-12 DIAGNOSIS — Z83438 Family history of other disorder of lipoprotein metabolism and other lipidemia: Secondary | ICD-10-CM

## 2020-06-12 DIAGNOSIS — I252 Old myocardial infarction: Secondary | ICD-10-CM

## 2020-06-12 DIAGNOSIS — Y832 Surgical operation with anastomosis, bypass or graft as the cause of abnormal reaction of the patient, or of later complication, without mention of misadventure at the time of the procedure: Secondary | ICD-10-CM | POA: Diagnosis present

## 2020-06-12 DIAGNOSIS — N189 Chronic kidney disease, unspecified: Secondary | ICD-10-CM | POA: Diagnosis present

## 2020-06-12 DIAGNOSIS — Z833 Family history of diabetes mellitus: Secondary | ICD-10-CM

## 2020-06-12 DIAGNOSIS — N2581 Secondary hyperparathyroidism of renal origin: Secondary | ICD-10-CM | POA: Diagnosis present

## 2020-06-12 DIAGNOSIS — Z992 Dependence on renal dialysis: Secondary | ICD-10-CM

## 2020-06-12 DIAGNOSIS — J9601 Acute respiratory failure with hypoxia: Secondary | ICD-10-CM | POA: Diagnosis present

## 2020-06-12 DIAGNOSIS — J810 Acute pulmonary edema: Secondary | ICD-10-CM | POA: Diagnosis present

## 2020-06-12 DIAGNOSIS — Z8674 Personal history of sudden cardiac arrest: Secondary | ICD-10-CM

## 2020-06-12 DIAGNOSIS — Z881 Allergy status to other antibiotic agents status: Secondary | ICD-10-CM

## 2020-06-12 DIAGNOSIS — D539 Nutritional anemia, unspecified: Secondary | ICD-10-CM | POA: Diagnosis present

## 2020-06-12 DIAGNOSIS — Z539 Procedure and treatment not carried out, unspecified reason: Secondary | ICD-10-CM | POA: Diagnosis not present

## 2020-06-12 DIAGNOSIS — I248 Other forms of acute ischemic heart disease: Secondary | ICD-10-CM | POA: Diagnosis present

## 2020-06-12 DIAGNOSIS — Z8673 Personal history of transient ischemic attack (TIA), and cerebral infarction without residual deficits: Secondary | ICD-10-CM

## 2020-06-12 DIAGNOSIS — T82868A Thrombosis of vascular prosthetic devices, implants and grafts, initial encounter: Secondary | ICD-10-CM

## 2020-06-12 DIAGNOSIS — K5520 Angiodysplasia of colon without hemorrhage: Secondary | ICD-10-CM | POA: Diagnosis present

## 2020-06-12 DIAGNOSIS — I25119 Atherosclerotic heart disease of native coronary artery with unspecified angina pectoris: Secondary | ICD-10-CM | POA: Diagnosis present

## 2020-06-12 DIAGNOSIS — K219 Gastro-esophageal reflux disease without esophagitis: Secondary | ICD-10-CM | POA: Diagnosis present

## 2020-06-12 DIAGNOSIS — I953 Hypotension of hemodialysis: Secondary | ICD-10-CM

## 2020-06-12 DIAGNOSIS — E8889 Other specified metabolic disorders: Secondary | ICD-10-CM | POA: Diagnosis present

## 2020-06-12 DIAGNOSIS — Z85038 Personal history of other malignant neoplasm of large intestine: Secondary | ICD-10-CM

## 2020-06-12 DIAGNOSIS — Z885 Allergy status to narcotic agent status: Secondary | ICD-10-CM

## 2020-06-12 DIAGNOSIS — Z79899 Other long term (current) drug therapy: Secondary | ICD-10-CM

## 2020-06-12 DIAGNOSIS — Z8543 Personal history of malignant neoplasm of ovary: Secondary | ICD-10-CM

## 2020-06-12 DIAGNOSIS — E7800 Pure hypercholesterolemia, unspecified: Secondary | ICD-10-CM | POA: Diagnosis present

## 2020-06-12 DIAGNOSIS — Z800 Family history of malignant neoplasm of digestive organs: Secondary | ICD-10-CM

## 2020-06-12 DIAGNOSIS — Z951 Presence of aortocoronary bypass graft: Secondary | ICD-10-CM

## 2020-06-12 DIAGNOSIS — Z7982 Long term (current) use of aspirin: Secondary | ICD-10-CM

## 2020-06-12 LAB — CBC
HCT: 28.4 % — ABNORMAL LOW (ref 36.0–46.0)
Hemoglobin: 8.4 g/dL — ABNORMAL LOW (ref 12.0–15.0)
MCH: 34.4 pg — ABNORMAL HIGH (ref 26.0–34.0)
MCHC: 29.6 g/dL — ABNORMAL LOW (ref 30.0–36.0)
MCV: 116.4 fL — ABNORMAL HIGH (ref 80.0–100.0)
Platelets: 256 10*3/uL (ref 150–400)
RBC: 2.44 MIL/uL — ABNORMAL LOW (ref 3.87–5.11)
RDW: 24.2 % — ABNORMAL HIGH (ref 11.5–15.5)
WBC: 5.3 10*3/uL (ref 4.0–10.5)
nRBC: 0 % (ref 0.0–0.2)

## 2020-06-12 LAB — PROTIME-INR
INR: 1.1 (ref 0.8–1.2)
Prothrombin Time: 13.8 seconds (ref 11.4–15.2)

## 2020-06-12 LAB — BASIC METABOLIC PANEL
Anion gap: 20 — ABNORMAL HIGH (ref 5–15)
BUN: 49 mg/dL — ABNORMAL HIGH (ref 8–23)
CO2: 23 mmol/L (ref 22–32)
Calcium: 9 mg/dL (ref 8.9–10.3)
Chloride: 98 mmol/L (ref 98–111)
Creatinine, Ser: 9.26 mg/dL — ABNORMAL HIGH (ref 0.44–1.00)
GFR, Estimated: 4 mL/min — ABNORMAL LOW (ref >60)
Glucose, Bld: 80 mg/dL (ref 70–99)
Potassium: 4.9 mmol/L (ref 3.5–5.1)
Sodium: 141 mmol/L (ref 135–145)

## 2020-06-12 LAB — RESP PANEL BY RT-PCR (FLU A&B, COVID) ARPGX2
Influenza A by PCR: NEGATIVE
Influenza B by PCR: NEGATIVE
SARS Coronavirus 2 by RT PCR: NEGATIVE

## 2020-06-12 LAB — TROPONIN I (HIGH SENSITIVITY): Troponin I (High Sensitivity): 59 ng/L — ABNORMAL HIGH (ref <18)

## 2020-06-12 IMAGING — DX DG CHEST 1V PORT
1 series · 1 of 1 positions shown · non-contrast
Comparison: [DATE]

CLINICAL DATA: Evaluate for volume overload.

EXAM:
PORTABLE CHEST 1 VIEW

[chest ap]
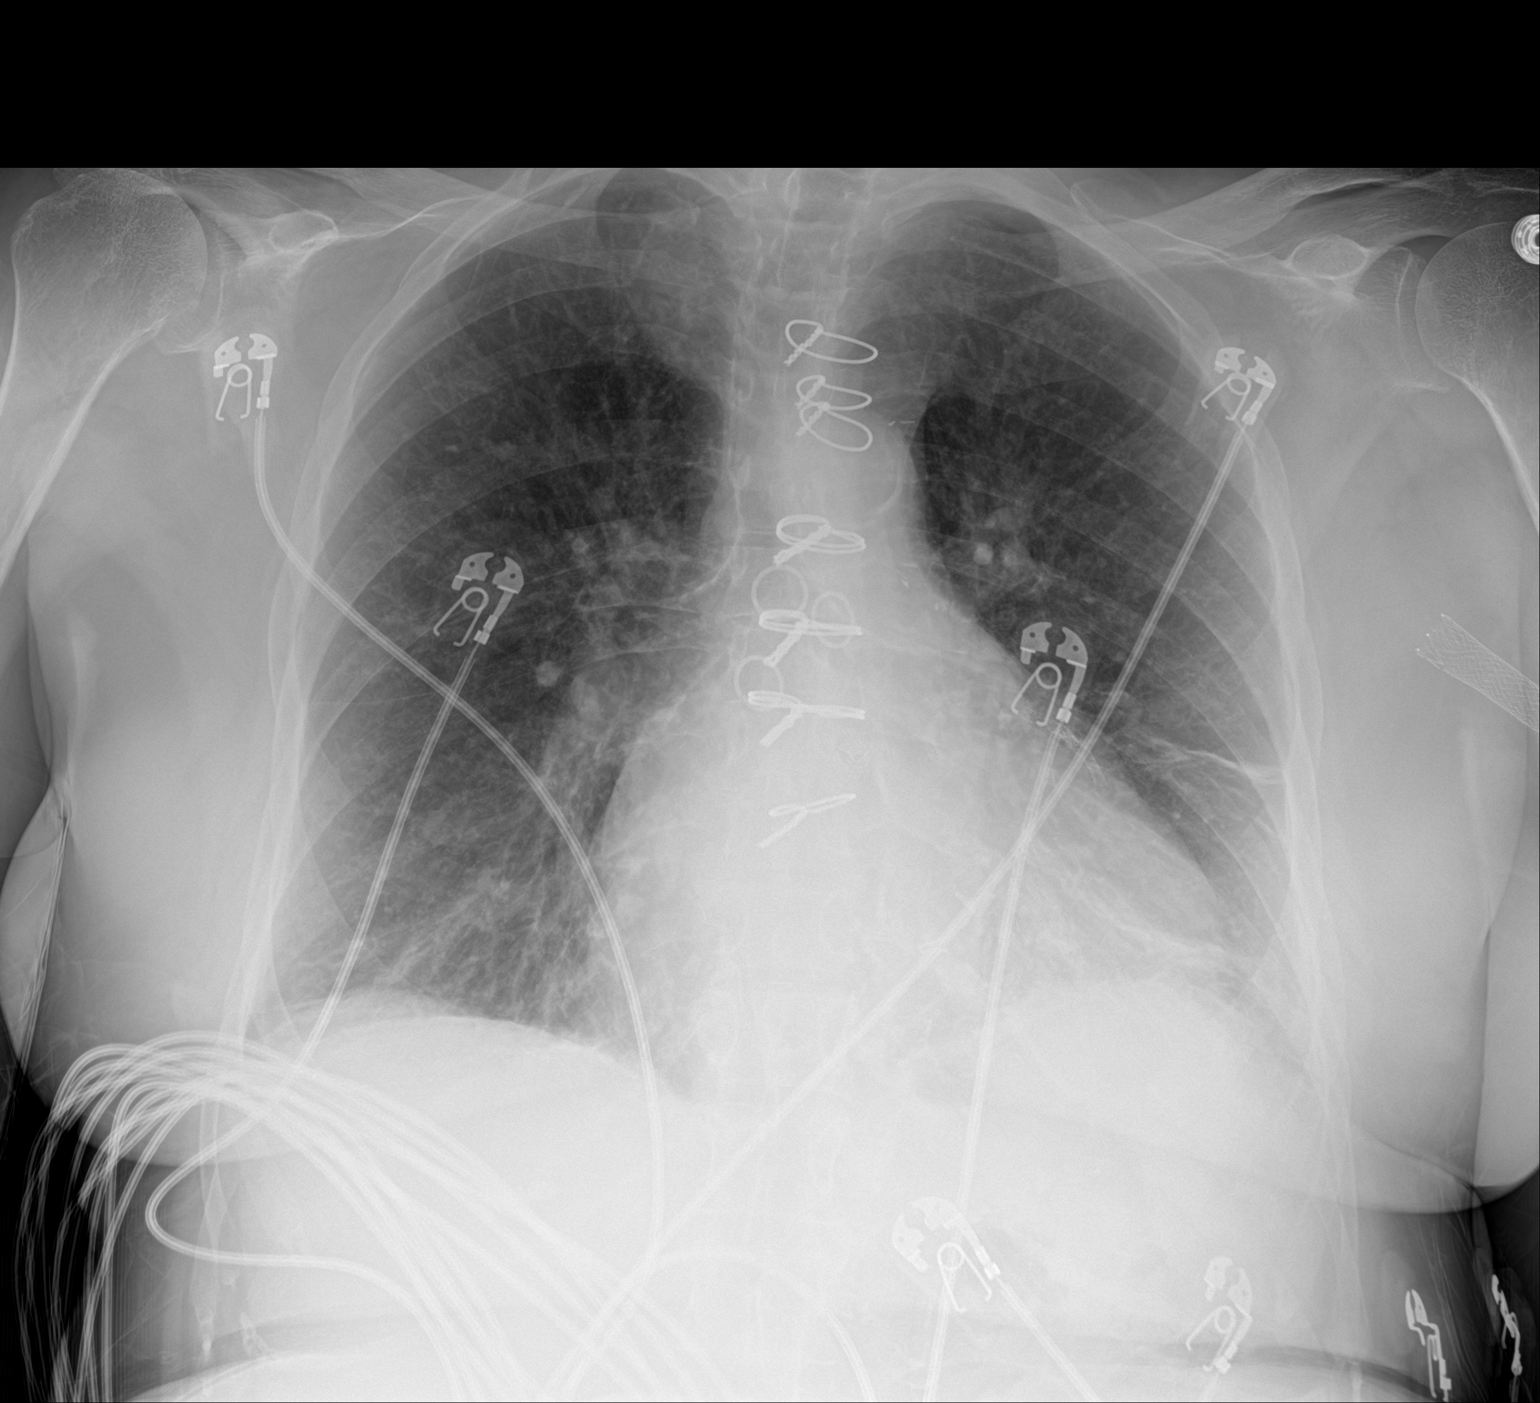

[1 of 1 positions shown; findings below may reference images not displayed]

FINDINGS: No pneumothorax. Scattered atelectasis in the bases. Mild pulmonary
venous congestion without overt edema. No pneumothorax. No other
acute abnormalities.
IMPRESSION: Cardiomegaly and pulmonary venous congestion without overt edema

Bibasilar atelectasis.

## 2020-06-12 MED ORDER — NITROGLYCERIN 0.4 MG SL SUBL
0.4000 mg | SUBLINGUAL_TABLET | SUBLINGUAL | Status: DC | PRN
  Administered 2020-06-12 – 2020-06-17 (×5): 0.4 mg via SUBLINGUAL
  Filled 2020-06-12 (×2): qty 1

## 2020-06-12 MED ORDER — LIDOCAINE-EPINEPHRINE 1 %-1:100000 IJ SOLN
10.0000 mL | Freq: Once | INTRAMUSCULAR | Status: AC
  Administered 2020-06-12: 10 mL via INTRADERMAL
  Filled 2020-06-12: qty 1

## 2020-06-12 MED ORDER — NITROGLYCERIN IN D5W 200-5 MCG/ML-% IV SOLN
0.0000 ug/min | INTRAVENOUS | Status: DC
  Filled 2020-06-12: qty 250

## 2020-06-12 MED ORDER — NITROPRUSSIDE SODIUM-NACL 20-0.9 MG/100ML-% IV SOLN: 0.0000 ug/kg/min | INTRAVENOUS | Status: DC

## 2020-06-12 MED ORDER — CHLORHEXIDINE GLUCONATE CLOTH 2 % EX PADS
6.0000 | MEDICATED_PAD | Freq: Every day | CUTANEOUS | Status: DC
  Administered 2020-06-14 – 2020-06-16 (×2): 6 via TOPICAL

## 2020-06-12 MED ORDER — CLONIDINE HCL 0.1 MG PO TABS
0.1000 mg | ORAL_TABLET | Freq: Once | ORAL | Status: AC
  Administered 2020-06-12: 0.1 mg via ORAL
  Filled 2020-06-12: qty 1

## 2020-06-12 MED ORDER — DARBEPOETIN ALFA 60 MCG/0.3ML IJ SOSY
60.0000 ug | PREFILLED_SYRINGE | INTRAMUSCULAR | Status: DC
  Filled 2020-06-12 (×2): qty 0.3

## 2020-06-12 NOTE — ED Notes (Signed)
Pt states she felt short-winded. Placed on 2L [place]

## 2020-06-12 NOTE — ED Triage Notes (Signed)
Pt reports left arm fistula is clotted, no bruit or thrill noted to arm. Pt usually MWF, last full dialysis was Monday, attempted on Wednesday but did not finish due to issues. Pt has no complaints.

## 2020-06-12 NOTE — ED Notes (Signed)
Consent at bedside.  

## 2020-06-12 NOTE — ED Provider Notes (Signed)
[facility] EMERGENCY DEPARTMENT Provider Note   CSN: [id number] Arrival date & time: 06/12/20  1424     History Chief Complaint  Patient presents with  . Vascular Access Problem    Shawna [name] is a 79 y.o. female.  HPI     79 year old female with history of ESRD on HD, CAD, CHF comes in a chief complaint of vascular access problem. Patient receives her dialysis on M, W, F.  Her last hemodialysis session was on Wednesday.  The dialysis was not completed and she was asked to come back on Thursday for further fluid removal, but at that time they were unable to access her AV fistula.  She was informed that her AV fistula is clotted and advised to come to the ER as they were unable to get her an appointment to get the fistula declotted  While awaiting work-up, patient had an episode of chest pain on the left side.  The pain was radiating to her left shoulder.  She has history of angina because of her CAD.  The pain had resolved when I reassessed her.  Over time she also started feeling short of breath.  She reports that she has history of pulmonary edema that required intubation in the past.  Patient does not think she can wait until Monday to get hemodialysis  Past Medical History:  Diagnosis Date  . Acute on chronic respiratory failure with hypoxia ([place]) 10/10/2016  . Anxiety   . Arthritis   . AVM (arteriovenous malformation) of colon   . CAD (coronary artery disease)    a. s/p CABG in 2013 b. DES to D1 in 10/2016. c. cath in 07/2018 showing patent grafts with occlusion of D1 at prior stent site and progression of PDA disease --> medical management recommended  . Carotid artery disease ([place])    a. 40-98% LICA, 07/1912   . Chronic anemia   . Chronic bronchitis ([place])   . Chronic diastolic CHF (congestive heart failure) ([place])    a. 02/2012 Echo EF 60-65%, nl wall motion, Gr 1 DD, mod MR  . Colon cancer ([place]) 1992  . Esophageal stricture   . ESRD on hemodialysis  ([place])    ESRD due to HTN, started dialysis 2011 and gets HD at [facility] with Dr Hinda Lenis on MWF schedule.  Access is LUA AVF as of Sept 2014.   Marland Kitchen GERD (gastroesophageal reflux disease)   . High cholesterol 12/2011  . History of blood transfusion 07/2011; 12/2011; 01/2012 X 2; 04/2012  . History of gout   . History of lower GI bleeding   . Hypertension   . Iron deficiency anemia   . Mitral regurgitation    a. Moderate by echo, 02/2012  . Myocardial infarction ([place])   . NSVT (nonsustained ventricular tachycardia) ([place])   . Ovarian cancer ([place]) 1992  . PAF (paroxysmal atrial fibrillation) ([place])   . Pneumonia ~ 2009  . PUD (peptic ulcer disease)   . TIA (transient ischemic attack)     Patient Active Problem List   Diagnosis Date Noted  . Irritable bowel syndrome 02/25/2020  . Pulmonary edema 10/14/2019  . Adenomatous duodenal polyp 09/10/2019  . History of GI bleed 09/10/2019  . Angina pectoris ([place]) 06/05/2019  . Chest pain 06/03/2019  . Rectal bleeding 05/14/2019  . Small intestinal bacterial overgrowth 05/14/2019  . Iron deficiency anemia 04/02/2019  . GI bleed 03/08/2019  . Gastrointestinal hemorrhage with melena 03/06/2019  . Acute respiratory failure with hypoxia (  [place]) 12/25/2018  . Elevated troponin 12/14/2018  . Chest pain at rest 07/13/2018  . Hand steal syndrome ([place]) 08/01/2017  . Macrocytic anemia 07/14/2017  . Coronary artery disease of bypass graft of native heart with stable angina pectoris ([place]) 06/05/2017  . Mesenteric ischemia ([place])   . Diverticulitis   . SBO (small bowel obstruction) ([place]) 03/22/2017  . Enteritis   . Complication of vascular access for dialysis 03/19/2017  . Preoperative clearance 01/25/2017  . Symptomatic anemia 10/24/2016  . H/O non-ST elevation myocardial infarction (NSTEMI) 10/24/2016  . Fluid overload 10/10/2016  . Complication from renal dialysis device 10/10/2016  . Non-ST elevation MI (NSTEMI) ([place])   . Encounter for fitting and  adjustment of vascular catheter   . Heme positive stool   . Demand ischemia ([place]) 07/27/2016  . Hypertensive emergency 07/08/2016  . Acute on chronic respiratory failure with hypoxia ([place])   . Cardiac arrest ([place])   . Palliative care encounter   . Goals of care, counseling/discussion   . Hypertensive crisis without congestive heart failure 05/09/2016  . Acute pulmonary edema ([place]) 04/06/2016  . Acute respiratory failure ([place]) 04/06/2016  . Hypertensive crisis 01/27/2016  . History of colon cancer 01/27/2016  . History of ovarian cancer 01/27/2016  . Hypertensive urgency 01/27/2016  . AF (paroxysmal atrial fibrillation) ([place]) 10/14/2015  . Coronary angioplasty status 10/14/2015  . Malignant neoplasm of right ovary ([place]) 10/14/2015  . Narrow complex tachycardia ([place]) 09/08/2015  . SVT (supraventricular tachycardia) ([place]) 09/08/2015  . Influenza A 08/30/2015  . Acute on chronic diastolic CHF (congestive heart failure) ([place]) 05/04/2015  . Unstable angina ([place]) 05/03/2015  . DOE (dyspnea on exertion)   . Essential hypertension   . Pain in joint, lower leg 08/14/2014  . Dacryocystitis 05/29/2013  . Chronic diastolic CHF (congestive heart failure) ([name]) 03/22/2013  . GI bleeding 03/21/2013  . Acute blood loss anemia 03/21/2013  . Vaginal odor 03/12/2013  . Vaginal discharge 03/12/2013  . Occlusion and stenosis of carotid artery without mention of cerebral infarction 01/24/2013  . Hx of CABG 07/05/2012  . Carotid artery disease ([place]) 07/05/2012  . Mitral regurgitation 06/12/2012  . Pneumonia 06/09/2012  . Non-STEMI (non-ST elevated myocardial infarction) ([place]) 06/08/2012  . Ischemic chest pain ([place]) 03/01/2012  . 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 GERD (gastroesophageal reflux disease) 01/09/2012  . HLD (hyperlipidemia) 01/05/2012  . Atherosclerotic heart disease of native coronary artery without angina pectoris 12/16/2011  . Essential  hypertension, benign 12/16/2011  . ESRD on hemodialysis ([place]) 12/16/2011  . Anxiety disorder 05/04/2011  . Anemia in chronic kidney disease 04/29/2011  . Secondary hyperparathyroidism of renal origin ([place]) 04/29/2011  . End stage renal disease ([place]) 04/29/2011  . Gout 04/29/2011  . Hypertensive chronic kidney disease with stage 5 chronic kidney disease or end stage renal disease ([place]) 04/29/2011    Past Surgical History:  Procedure Laterality Date  . A/V SHUNTOGRAM Left 03/19/2019   Procedure: A/V SHUNTOGRAM;  Surgeon: Katha Cabal, MD;  Location: [facility] LAB;  Service: Cardiovascular;  Laterality: Left;  . ABDOMINAL HYSTERECTOMY  1992  . APPENDECTOMY  06/1990  . AV FISTULA PLACEMENT  07/2009   left upper arm  . AV FISTULA PLACEMENT Right 09/06/2016   Procedure: RIGHT FOREARM ARTERIOVENOUS (AV) GRAFT;  Surgeon: Elam Dutch, MD;  Location: [facility];  Service: Vascular;  Laterality: Right;  . AV FISTULA PLACEMENT N/A 02/24/2017   Procedure: INSERTION OF ARTERIOVENOUS (AV) GORE-TEX GRAFT ARM (  BRACHIAL AXILLARY);  Surgeon: Katha Cabal, MD;  Location: [facility] ORS;  Service: Vascular;  Laterality: N/A;  . [place] Right 09/06/2016   Procedure: REMOVAL OF Right Arm ARTERIOVENOUS GORETEX GRAFT and Vein Patch angioplasty of brachial artery;  Surgeon: Angelia Mould, MD;  Location: [facility];  Service: Vascular;  Laterality: Right;  . BIOPSY  09/26/2019   Procedure: BIOPSY;  Surgeon: Rogene Houston, MD;  [facility];  Service: Endoscopy;;  . COLON RESECTION  1992  . COLON SURGERY    . COLONOSCOPY N/A 03/09/2019   Procedure: COLONOSCOPY;  Surgeon: Rogene Houston, MD;  [facility];  Service: Endoscopy;  Laterality: N/A;  . CORONARY ANGIOPLASTY WITH STENT PLACEMENT  12/15/11   "2"  . CORONARY ANGIOPLASTY WITH STENT PLACEMENT  y/2013   "1; makes total of 3" (05/02/2012)  . CORONARY ARTERY BYPASS GRAFT  06/13/2012   Procedure: CORONARY ARTERY BYPASS  GRAFTING (CABG);  Surgeon: Grace Isaac, MD;  Location: [facility];  Service: Open Heart Surgery;  Laterality: N/A;  cabg x four;  using left internal mammary artery, and left leg greater saphenous vein harvested endoscopically  . CORONARY STENT INTERVENTION N/A 10/13/2016   Procedure: Coronary Stent Intervention;  Surgeon: Troy Sine, MD;  Location: [facility];  Service: Cardiovascular;  Laterality: N/A;  . DIALYSIS/PERMA CATHETER REMOVAL N/A 04/18/2017   Procedure: DIALYSIS/PERMA CATHETER REMOVAL;  Surgeon: Katha Cabal, MD;  Location: [facility] CV LAB;  Service: Cardiovascular;  Laterality: N/A;  . DILATION AND CURETTAGE OF UTERUS    . ESOPHAGOGASTRODUODENOSCOPY  01/20/2012   Procedure: ESOPHAGOGASTRODUODENOSCOPY (EGD);  Surgeon: Ladene Artist, MD,FACG;  Location: [facility] ENDOSCOPY;  Service: Endoscopy;  Laterality: N/A;  . ESOPHAGOGASTRODUODENOSCOPY N/A 03/26/2013   Procedure: ESOPHAGOGASTRODUODENOSCOPY (EGD);  Surgeon: Irene Shipper, MD;  Location[facility]OSCOPY;  Service: Endoscopy;  Laterality: N/A;  . ESOPHAGOGASTRODUODENOSCOPY N/A 04/30/2015   Procedure: ESOPHAGOGASTRODUODENOSCOPY (EGD);  Surgeon: Rogene Houston, MD;  [facility];  Service: Endoscopy;  Laterality: N/A;  1pm - moved to 10/20 @ 1:10  . ESOPHAGOGASTRODUODENOSCOPY N/A 07/29/2016   Procedure: ESOPHAGOGASTRODUODENOSCOPY (EGD);  Surgeon: Manus Gunning, MD;  Location: [facility];  Service: Gastroenterology;  Laterality: N/A;  enteroscopy  . ESOPHAGOGASTRODUODENOSCOPY N/A 09/26/2019   Procedure: ESOPHAGOGASTRODUODENOSCOPY (EGD);  Surgeon: Rogene Houston, MD;  [facility];  Service: Endoscopy;  Laterality: N/A;  1250  . GIVENS CAPSULE STUDY N/A 03/07/2019   Procedure: GIVENS CAPSULE STUDY;  Surgeon: Rogene Houston, MD;  [facility];  Service: Endoscopy;  Laterality: N/A;  7:30  . INTRAOPERATIVE TRANSESOPHAGEAL ECHOCARDIOGRAM  06/13/2012   Procedure: INTRAOPERATIVE  TRANSESOPHAGEAL ECHOCARDIOGRAM;  Surgeon: Grace Isaac, MD;  Location: [facility];  Service: Open Heart Surgery;  Laterality: N/A;  . IR GENERIC HISTORICAL  07/26/2016   IR FLUORO GUIDE CV LINE RIGHT 07/26/2016 Greggory Keen, MD [facility]  . IR GENERIC HISTORICAL  07/26/2016   IR US GUIDE VASC ACCESS RIGHT 07/26/2016 Greggory Keen, MD [facility]  . IR GENERIC HISTORICAL  08/02/2016   IR US GUIDE VASC ACCESS RIGHT 08/02/2016 Greggory Keen, MD [facility]  . IR GENERIC HISTORICAL  08/02/2016   IR FLUORO GUIDE CV LINE RIGHT 08/02/2016 Greggory Keen, MD [facility]  . IR RADIOLOGY PERIPHERAL GUIDED IV START  03/28/2017  . IR US GUIDE VASC ACCESS RIGHT  03/28/2017  . LEFT HEART CATH AND CORONARY ANGIOGRAPHY N/A 09/20/2016   Procedure: Left Heart Cath and  Coronary Angiography;  Surgeon: [name], MD;  Location: [facility];  Service: Cardiovascular;  Laterality: N/A;  . LEFT HEART CATH AND CORS/GRAFTS ANGIOGRAPHY N/A 10/13/2016   Procedure: Left Heart Cath and Cors/Grafts Angiography;  Surgeon: Troy Sine, MD;  Location: [facility] LAB;  Service: Cardiovascular;  Laterality: N/A;  . LEFT HEART CATH AND CORS/GRAFTS ANGIOGRAPHY N/A 07/13/2018   Procedure: LEFT HEART CATH AND CORS/GRAFTS ANGIOGRAPHY;  Surgeon: Martinique, Peter M, MD;  Location: [facility] LAB;  Service: Cardiovascular;  Laterality: N/A;  . LEFT HEART CATHETERIZATION WITH CORONARY ANGIOGRAM N/A 12/15/2011   Procedure: LEFT HEART CATHETERIZATION WITH CORONARY ANGIOGRAM;  Surgeon: Burnell Blanks, MD;  Location: [facility] CATH LAB;  Service: Cardiovascular;  Laterality: N/A;  . LEFT HEART CATHETERIZATION WITH CORONARY ANGIOGRAM N/A 01/10/2012   Procedure: LEFT HEART CATHETERIZATION WITH CORONARY ANGIOGRAM;  Surgeon: Peter M Martinique, MD;  Location: [facility] CATH LAB;  Service: Cardiovascular;  Laterality: N/A;  . LEFT HEART CATHETERIZATION WITH CORONARY ANGIOGRAM N/A 06/08/2012   Procedure: LEFT HEART CATHETERIZATION WITH CORONARY  ANGIOGRAM;  Surgeon: Burnell Blanks, MD;  Location: [facility] CATH LAB;  Service: Cardiovascular;  Laterality: N/A;  . LEFT HEART CATHETERIZATION WITH CORONARY/GRAFT ANGIOGRAM N/A 12/10/2013   Procedure: LEFT HEART CATHETERIZATION WITH Beatrix Fetters;  Surgeon: [name], MD;  Location: [facility] CATH LAB;  Service: Cardiovascular;  Laterality: N/A;  . OVARY SURGERY     ovarian cancer  . POLYPECTOMY  03/09/2019   Procedure: POLYPECTOMY;  Surgeon: Rogene Houston, MD;  [facility];  Service: Endoscopy;;  cecal   . POLYPECTOMY N/A 09/26/2019   Procedure: DUODENAL POLYPECTOMY;  Surgeon: Rogene Houston, MD;  [facility];  Service: Endoscopy;  Laterality: N/A;  . REVISION OF ARTERIOVENOUS GORETEX GRAFT N/A 02/24/2017   Procedure: REVISION OF ARTERIOVENOUS GORETEX GRAFT (RESECTION);  Surgeon: Katha Cabal, MD;  Location: [facility] ORS;  Service: Vascular;  Laterality: N/A;  Earney Mallet N/A 10/15/2013   Procedure: Fistulogram;  Surgeon: Serafina Mitchell, MD;  Location: [facility] CATH LAB;  Service: Cardiovascular;  Laterality: N/A;  . THROMBECTOMY / ARTERIOVENOUS GRAFT REVISION  2011   left upper arm  . TUBAL LIGATION  1980's  . UPPER EXTREMITY ANGIOGRAPHY Bilateral 12/06/2016   Procedure: Upper Extremity Angiography;  Surgeon: Katha Cabal, MD;  Location: [facility] LAB;  Service: Cardiovascular;  Laterality: Bilateral;  . UPPER EXTREMITY INTERVENTION Left 06/06/2017   Procedure: UPPER EXTREMITY INTERVENTION;  Surgeon: Katha Cabal, MD;  Location: [facility] CV LAB;  Service: Cardiovascular;  Laterality: Left;     OB History    Gravida  2   Para  2   Term      Preterm  2   AB      Living  2     SAB      TAB      Ectopic      Multiple      Live Births              Family History  Problem Relation Age of Onset  . Heart disease Mother        Heart Disease before age 89  . Hyperlipidemia Mother   . Hypertension Mother   .  Diabetes Mother   . Heart attack Mother   . Heart disease Father        Heart Disease before age 26  . Hyperlipidemia Father   . Hypertension Father   .  Diabetes Father   . Diabetes Sister   . Hypertension Sister   . Diabetes Brother   . Hyperlipidemia Brother   . Heart attack Brother   . Hypertension Sister   . Heart attack Brother   . Colon cancer Child 77  . Other Other        noncontributory for early CAD  . Esophageal cancer Neg Hx   . Liver disease Neg Hx   . Kidney disease Neg Hx   . Colon polyps Neg Hx     Social History   Tobacco Use  . Smoking status: Never Smoker  . Smokeless tobacco: Never Used  Vaping Use  . Vaping Use: Never used  Substance Use Topics  . Alcohol use: No    Alcohol/week: 0.0 standard drinks  . Drug use: No    Home Medications Prior to Admission medications   Medication Sig Start Date End Date Taking? Authorizing Provider  ALPRAZolam Duanne Moron) 0.5 MG tablet Take 0.5 mg by mouth at bedtime as needed for anxiety.    [provider]  amiodarone (PACERONE) 200 MG tablet Take 1 tablet (200 mg total) by mouth daily. Starting July 2 12/17/19   Ahmed Prima Fransisco Hertz, PA-C  aspirin EC 81 MG tablet Take 81 mg by mouth daily.  09/27/19   Rehman, Mechele Dawley, MD  cyclobenzaprine (FLEXERIL) 5 MG tablet Take 5 mg by mouth 3 (three) times daily as needed. 05/08/20   [provider]  dicyclomine (BENTYL) 10 MG capsule Take 10 mg by mouth 4 (four) times daily -  before meals and at bedtime.    [provider]  fluticasone (FLONASE) 50 MCG/ACT nasal spray Place 1 spray into both nostrils at bedtime as needed for allergies.     [provider]  gabapentin (NEURONTIN) 100 MG capsule Take 100 mg by mouth daily.  04/16/20   [provider]  isosorbide mononitrate (IMDUR) 60 MG 24 hr tablet Take 1 tablet (60 mg total) by mouth 2 (two) times daily. 10/22/19 02/11/21  Herminio Commons, MD  lidocaine-prilocaine (EMLA) cream Apply 1  application topically every Monday, Wednesday, and Friday. Prior to dialysis    [provider]  loratadine (CLARITIN) 10 MG tablet Take 1 tablet (10 mg total) by mouth daily as needed for allergies. 07/14/18   Hosie Poisson, MD  multivitamin (RENA-VIT) TABS tablet Take 1 tablet by mouth daily.    [provider]  nitroGLYCERIN (NITROSTAT) 0.4 MG SL tablet Place 1 tablet (0.4 mg total) under the tongue every 5 (five) minutes as needed for chest pain. 05/07/20   Johnson, Clanford L, MD  nystatin-triamcinolone (MYCOLOG II) cream Apply 1 application topically 2 (two) times daily. 02/25/20   Rogene Houston, MD  omeprazole (PRILOSEC) 40 MG capsule Take 1 capsule (40 mg total) by mouth daily before breakfast. 03/31/20   Laurine Blazer A, PA-C  ondansetron (ZOFRAN-ODT) 4 MG disintegrating tablet Take 4 mg by mouth every 8 (eight) hours as needed for nausea or vomiting.  03/05/19   [provider]  simvastatin (ZOCOR) 20 MG tablet TAKE 1 TABLET BY MOUTH ONCE DAILY AT BEDTIME Patient taking differently: Take 20 mg by mouth at bedtime.  07/22/19   Herminio Commons, MD    Allergies    Aspirin, Penicillins, Amlodipine, Bactrim [sulfamethoxazole-trimethoprim], Contrast media [iodinated diagnostic agents], Iron, Nitrofurantoin, Tylenol [acetaminophen], Gabapentin, Hydralazine, Dexilant [dexlansoprazole], Levaquin [levofloxacin in d5w], Morphine and related, Plavix [clopidogrel bisulfate], Protonix [pantoprazole sodium], and Venofer [ferric oxide]  Review of  Systems   Review of Systems  Constitutional: Positive for activity change.  Respiratory: Positive for shortness of breath.   Cardiovascular: Positive for chest pain.  Gastrointestinal: Negative for nausea and vomiting.  Neurological: Negative for dizziness.  All other systems reviewed and are negative.   Physical Exam Updated Vital Signs BP (!) 160/99 (BP Location: Right Wrist)   Pulse 86   Temp 98.4 F (36.9 C) (Oral)    Resp (!) 21   Ht 5\' 1"  (1.549 m)   Wt 61.2 kg   SpO2 100%   BMI 25.51 kg/m   Physical Exam Vitals and nursing note reviewed.  Constitutional:      Appearance: She is well-developed.  HENT:     Head: Normocephalic and atraumatic.  Cardiovascular:     Rate and Rhythm: Normal rate.  Pulmonary:     Effort: Pulmonary effort is normal.  Abdominal:     General: Bowel sounds are normal.  Musculoskeletal:     Cervical back: Normal range of motion and neck supple.  Skin:    General: Skin is warm and dry.  Neurological:     Mental Status: She is alert and oriented to person, place, and time.     ED Results / Procedures / Treatments   Labs (all labs ordered are listed, but only abnormal results are displayed) Labs Reviewed  BASIC METABOLIC PANEL - Abnormal; Notable for the following components:      Result Value   BUN 49 (*)    Creatinine, Ser 9.26 (*)    GFR, Estimated 4 (*)    Anion gap 20 (*)    All other components within normal limits  CBC - Abnormal; Notable for the following components:   RBC 2.44 (*)    Hemoglobin 8.4 (*)    HCT 28.4 (*)    MCV 116.4 (*)    MCH 34.4 (*)    MCHC 29.6 (*)    RDW 24.2 (*)    All other components within normal limits  RESP PANEL BY RT-PCR (FLU A&B, COVID) ARPGX2  PROTIME-INR  RENAL FUNCTION PANEL  CBC  TROPONIN I (HIGH SENSITIVITY)  TROPONIN I (HIGH SENSITIVITY)    EKG EKG Interpretation  Date/Time:  Friday June 12 2020 14:29:05 EST Ventricular Rate:  66 PR Interval:  190 QRS Duration: 122 QT Interval:  466 QTC Calculation: 488 R Axis:   22 Text Interpretation: Normal sinus rhythm Left bundle branch block Abnormal ECG No acute changes Nonspecific ST and T wave abnormality - not new Confirmed by Varney Biles [phone]) on 06/12/2020 5:07:42 PM  ED ECG REPORT   Date: 06/12/2020  Rate: 72  Rhythm: normal sinus rhythm  QRS Axis: left  Intervals: normal  ST/T Wave abnormalities: nonspecific ST/T changes  Conduction  Disutrbances:left bundle branch block  Narrative Interpretation:   Old EKG Reviewed: unchanged  I have personally reviewed the EKG tracing and agree with the computerized printout as noted.   Radiology DG Chest Port 1 View  Result Date: 06/12/2020 CLINICAL DATA:  Evaluate for volume overload. EXAM: PORTABLE CHEST 1 VIEW COMPARISON:  May 13, 2020 FINDINGS: No pneumothorax. Scattered atelectasis in the bases. Mild pulmonary venous congestion without overt edema. No pneumothorax. No other acute abnormalities. IMPRESSION: Cardiomegaly and pulmonary venous congestion without overt edema Bibasilar atelectasis. Electronically Signed   By: Dorise Bullion III M.D   On: 06/12/2020 18:28    Procedures .Critical Care Performed by: Varney Biles, MD Authorized by: Varney Biles, MD   Critical care provider  statement:    Critical care time (minutes):  72   Critical care was necessary to treat or prevent imminent or life-threatening deterioration of the following conditions:  Circulatory failure and respiratory failure   Critical care was time spent personally by me on the following activities:  Discussions with consultants, evaluation of patient's response to treatment, examination of patient, ordering and performing treatments and interventions, ordering and review of laboratory studies, ordering and review of radiographic studies, pulse oximetry, re-evaluation of patient's condition, obtaining history from patient or surrogate and review of old charts   (including critical care time)  Medications Ordered in ED Medications  nitroGLYCERIN (NITROSTAT) SL tablet 0.4 mg (0.4 mg Sublingual Given 06/12/20 1748)  Chlorhexidine Gluconate Cloth 2 % PADS 6 each (has no administration in time range)  nitroGLYCERIN 50 mg in dextrose 5 % 250 mL (0.2 mg/mL) infusion (has no administration in time range)  Darbepoetin Alfa (ARANESP) injection 60 mcg (has no administration in time range)  cloNIDine  (CATAPRES) tablet 0.1 mg (0.1 mg Oral Given 06/12/20 2058)    ED Course  I have reviewed the triage vital signs and the nursing notes.  Pertinent labs & imaging results that were available during my care of the patient were reviewed by me and considered in my medical decision making (see chart for details).    MDM Rules/Calculators/A&P                          79 year old female comes in a chief complaint of dialysis access issues.  She has history of ESRD and has not had hemodialysis since Wednesday.  She started having some chest discomfort and shortness of breath while in the ER.  Patient is noted to be hypertensive.  Her BP medication were taken off because her BP was well controlled and was running low.   I suspect she is having hypertensive emergency.  We will start her on Cleviprex and give her oral clonidine.  I discussed the case with Dr. Trula Slade, vascular surgery.  I also discussed the case with Dr. Kathlene Cote, [place].  Both services reported that patient will not get procedural help with the fistula over the weekend.  Therefore if patient needs urgent dialysis, we will likely have to put in a Vas-Cath.  I spoke with Dr. Loletha Grayer, nephrology.  While he was assessing the patient, her breathing got slightly worse.  He will dialyze her as soon as possible.  We will put in a vascular catheter in the ER.    Final Clinical Impression(s) / ED Diagnoses Final diagnoses:  Acute pulmonary edema [facility])  Hypertensive emergency    Rx / DC Orders ED Discharge Orders    None       Varney Biles, MD 06/13/20 1737

## 2020-06-12 NOTE — ED Notes (Signed)
Central line cart at bedside

## 2020-06-12 NOTE — Consult Note (Signed)
[facility] Renal Consultation Note    Indication for Consultation:  Management of ESRD/hemodialysis; anemia, hypertension/volume and secondary hyperparathyroidism  HPI: Shawna [name] is a 79 y.o. female with a PMH significant for HTN, chronic diastolic CHF, chronic GI bleed from colonic AVM's, CAD, h/o colon cancer, and ESRD on HD MWF at [facility] HD who was sent to [facility] ED from her dialysis unit after her AVG was noted to be clotted.  She went to HD on 97/5/88 and had HD complicated by hypotension and unable to remove her goal of 3 liters and went back on 06/11/20 and unable to have HD due to clotted AVF.  Unclear why outpatient intervention was not arranged but was told to go to ED instead.  In the ED she was noted to be hypertensive with SBP ranging from 144-210.  She started to develop worsening SOB and chest tightness.  CXR revealed cardiomegaly and pulmonary venous congestion without overt edema.  SpO2 93-100%.  We were consulted to help provide urgent dialysis in setting of volume overload and Hypertensive urgency.  She now reports that the chest pain has resolved but does have pain in her left arm.  She denies any N/V/D, productive cough, hemoptysis, fevers or chills.  Past Medical History:  Diagnosis Date  . Acute on chronic respiratory failure with hypoxia ([place]) 10/10/2016  . Anxiety   . Arthritis   . AVM (arteriovenous malformation) of colon   . CAD (coronary artery disease)    a. s/p CABG in 2013 b. DES to D1 in 10/2016. c. cath in 07/2018 showing patent grafts with occlusion of D1 at prior stent site and progression of PDA disease --> medical management recommended  . Carotid artery disease ([place])    a. 32-54% LICA, 03/8263   . Chronic anemia   . Chronic bronchitis ([place])   . Chronic diastolic CHF (congestive heart failure) ([place])    a. 02/2012 Echo EF 60-65%, nl wall motion, Gr 1 DD, mod MR  . Colon cancer ([place]) 1992  . Esophageal stricture   . ESRD on hemodialysis  ([place])    ESRD due to HTN, started dialysis 2011 and gets HD at [facility] with Dr Hinda Lenis on MWF schedule.  Access is LUA AVF as of Sept 2014.   Marland Kitchen GERD (gastroesophageal reflux disease)   . High cholesterol 12/2011  . History of blood transfusion 07/2011; 12/2011; 01/2012 X 2; 04/2012  . History of gout   . History of lower GI bleeding   . Hypertension   . Iron deficiency anemia   . Mitral regurgitation    a. Moderate by echo, 02/2012  . Myocardial infarction ([place])   . NSVT (nonsustained ventricular tachycardia) ([place])   . Ovarian cancer ([place]) 1992  . PAF (paroxysmal atrial fibrillation) ([place])   . Pneumonia ~ 2009  . PUD (peptic ulcer disease)   . TIA (transient ischemic attack)    Past Surgical History:  Procedure Laterality Date  . A/V SHUNTOGRAM Left 03/19/2019   Procedure: A/V SHUNTOGRAM;  Surgeon: Katha Cabal, MD;  Location: [place] CV LAB;  Service: Cardiovascular;  Laterality: Left;  . ABDOMINAL HYSTERECTOMY  1992  . APPENDECTOMY  06/1990  . AV FISTULA PLACEMENT  07/2009   left upper arm  . AV FISTULA PLACEMENT Right 09/06/2016   Procedure: RIGHT FOREARM ARTERIOVENOUS (AV) GRAFT;  Surgeon: Elam Dutch, MD;  Location: [place];  Service: Vascular;  Laterality: Right;  . AV FISTULA PLACEMENT N/A 02/24/2017  Procedure: INSERTION OF ARTERIOVENOUS (AV) GORE-TEX GRAFT ARM (BRACHIAL AXILLARY);  Surgeon: Katha Cabal, MD;  Location: [facility] ORS;  Service: Vascular;  Laterality: N/A;  . [place] Right 09/06/2016   Procedure: REMOVAL OF Right Arm ARTERIOVENOUS GORETEX GRAFT and Vein Patch angioplasty of brachial artery;  Surgeon: Angelia Mould, MD;  Location: [place];  Service: Vascular;  Laterality: Right;  . BIOPSY  09/26/2019   Procedure: BIOPSY;  Surgeon: Rogene Houston, MD;  [facility];  Service: Endoscopy;;  . COLON RESECTION  1992  . COLON SURGERY    . COLONOSCOPY N/A 03/09/2019   Procedure: COLONOSCOPY;  Surgeon: Rogene Houston, MD;   [facility];  Service: Endoscopy;  Laterality: N/A;  . CORONARY ANGIOPLASTY WITH STENT PLACEMENT  12/15/11   "2"  . CORONARY ANGIOPLASTY WITH STENT PLACEMENT  y/2013   "1; makes total of 3" (05/02/2012)  . CORONARY ARTERY BYPASS GRAFT  06/13/2012   Procedure: CORONARY ARTERY BYPASS GRAFTING (CABG);  Surgeon: Grace Isaac, MD;  Location: [place];  Service: Open Heart Surgery;  Laterality: N/A;  cabg x four;  using left internal mammary artery, and left leg greater saphenous vein harvested endoscopically  . CORONARY STENT INTERVENTION N/A 10/13/2016   Procedure: Coronary Stent Intervention;  Surgeon: Troy Sine, MD;  Location: [place] CV LAB;  Service: Cardiovascular;  Laterality: N/A;  . DIALYSIS/PERMA CATHETER REMOVAL N/A 04/18/2017   Procedure: DIALYSIS/PERMA CATHETER REMOVAL;  Surgeon: Katha Cabal, MD;  Location: [place] CV LAB;  Service: Cardiovascular;  Laterality: N/A;  . DILATION AND CURETTAGE OF UTERUS    . ESOPHAGOGASTRODUODENOSCOPY  01/20/2012   Procedure: ESOPHAGOGASTRODUODENOSCOPY (EGD);  Surgeon: Ladene Artist, MD,FACG;  Location[facility]COPY;  Service: Endoscopy;  Laterality: N/A;  . ESOPHAGOGASTRODUODENOSCOPY N/A 03/26/2013   Procedure: ESOPHAGOGASTRODUODENOSCOPY (EGD);  Surgeon: Irene Shipper, MD;  Location: [facility] ENDOSCOPY;  Service: Endoscopy;  Laterality: N/A;  . ESOPHAGOGASTRODUODENOSCOPY N/A 04/30/2015   Procedure: ESOPHAGOGASTRODUODENOSCOPY (EGD);  Surgeon: Rogene Houston, MD;  [facility];  Service: Endoscopy;  Laterality: N/A;  1pm - moved to 10/20 @ 1:10  . ESOPHAGOGASTRODUODENOSCOPY N/A 07/29/2016   Procedure: ESOPHAGOGASTRODUODENOSCOPY (EGD);  Surgeon: Manus Gunning, MD;  Location: [name];  Service: Gastroenterology;  Laterality: N/A;  enteroscopy  . ESOPHAGOGASTRODUODENOSCOPY N/A 09/26/2019   Procedure: ESOPHAGOGASTRODUODENOSCOPY (EGD);  Surgeon: Rogene Houston, MD;  [facility];  Service: Endoscopy;   Laterality: N/A;  1250  . GIVENS CAPSULE STUDY N/A 03/07/2019   Procedure: GIVENS CAPSULE STUDY;  Surgeon: Rogene Houston, MD;  [facility];  Service: Endoscopy;  Laterality: N/A;  7:30  . INTRAOPERATIVE TRANSESOPHAGEAL ECHOCARDIOGRAM  06/13/2012   Procedure: INTRAOPERATIVE TRANSESOPHAGEAL ECHOCARDIOGRAM;  Surgeon: Grace Isaac, MD;  Location: [place];  Service: Open Heart Surgery;  Laterality: N/A;  . IR GENERIC HISTORICAL  07/26/2016   IR FLUORO GUIDE CV LINE RIGHT 07/26/2016 Greggory Keen, MD [facility]  . IR GENERIC HISTORICAL  07/26/2016   IR US GUIDE VASC ACCESS RIGHT 07/26/2016 Greggory Keen, MD [facility]  . IR GENERIC HISTORICAL  08/02/2016   IR US GUIDE VASC ACCESS RIGHT 08/02/2016 Greggory Keen, MD [facility]  . IR GENERIC HISTORICAL  08/02/2016   IR FLUORO GUIDE CV LINE RIGHT 08/02/2016 Greggory Keen, MD [facility]  . IR RADIOLOGY PERIPHERAL GUIDED IV START  03/28/2017  . IR US GUIDE VASC ACCESS RIGHT  03/28/2017  . LEFT HEART CATH AND CORONARY ANGIOGRAPHY N/A  09/20/2016   Procedure: Left Heart Cath and Coronary Angiography;  Surgeon: [name], MD;  Location: [place] CV LAB;  Service: Cardiovascular;  Laterality: N/A;  . LEFT HEART CATH AND CORS/GRAFTS ANGIOGRAPHY N/A 10/13/2016   Procedure: Left Heart Cath and Cors/Grafts Angiography;  Surgeon: Troy Sine, MD;  Location: [place] CV LAB;  Service: Cardiovascular;  Laterality: N/A;  . LEFT HEART CATH AND CORS/GRAFTS ANGIOGRAPHY N/A 07/13/2018   Procedure: LEFT HEART CATH AND CORS/GRAFTS ANGIOGRAPHY;  Surgeon: Martinique, Peter M, MD;  Location: [place] CV LAB;  Service: Cardiovascular;  Laterality: N/A;  . LEFT HEART CATHETERIZATION WITH CORONARY ANGIOGRAM N/A 12/15/2011   Procedure: LEFT HEART CATHETERIZATION WITH CORONARY ANGIOGRAM;  Surgeon: Burnell Blanks, MD;  Location: [facility] CATH LAB;  Service: Cardiovascular;  Laterality: N/A;  . LEFT HEART CATHETERIZATION WITH CORONARY ANGIOGRAM N/A 01/10/2012    Procedure: LEFT HEART CATHETERIZATION WITH CORONARY ANGIOGRAM;  Surgeon: Peter M Martinique, MD;  Location: [facility] CATH LAB;  Service: Cardiovascular;  Laterality: N/A;  . LEFT HEART CATHETERIZATION WITH CORONARY ANGIOGRAM N/A 06/08/2012   Procedure: LEFT HEART CATHETERIZATION WITH CORONARY ANGIOGRAM;  Surgeon: Burnell Blanks, MD;  Location: [facility] CATH LAB;  Service: Cardiovascular;  Laterality: N/A;  . LEFT HEART CATHETERIZATION WITH CORONARY/GRAFT ANGIOGRAM N/A 12/10/2013   Procedure: LEFT HEART CATHETERIZATION WITH Beatrix Fetters;  Surgeon: [name], MD;  Location: [facility] CATH LAB;  Service: Cardiovascular;  Laterality: N/A;  . OVARY SURGERY     ovarian cancer  . POLYPECTOMY  03/09/2019   Procedure: POLYPECTOMY;  Surgeon: Rogene Houston, MD;  [facility];  Service: Endoscopy;;  cecal   . POLYPECTOMY N/A 09/26/2019   Procedure: DUODENAL POLYPECTOMY;  Surgeon: Rogene Houston, MD;  [facility];  Service: Endoscopy;  Laterality: N/A;  . REVISION OF ARTERIOVENOUS GORETEX GRAFT N/A 02/24/2017   Procedure: REVISION OF ARTERIOVENOUS GORETEX GRAFT (RESECTION);  Surgeon: Katha Cabal, MD;  Location: [facility] ORS;  Service: Vascular;  Laterality: N/A;  [place] N/A 10/15/2013   Procedure: Fistulogram;  Surgeon: Serafina Mitchell, MD;  Location: [facility] CATH LAB;  Service: Cardiovascular;  Laterality: N/A;  . THROMBECTOMY / ARTERIOVENOUS GRAFT REVISION  2011   left upper arm  . TUBAL LIGATION  1980's  . UPPER EXTREMITY ANGIOGRAPHY Bilateral 12/06/2016   Procedure: Upper Extremity Angiography;  Surgeon: Katha Cabal, MD;  Location: [place] CV LAB;  Service: Cardiovascular;  Laterality: Bilateral;  . UPPER EXTREMITY INTERVENTION Left 06/06/2017   Procedure: UPPER EXTREMITY INTERVENTION;  Surgeon: Katha Cabal, MD;  Location: [place] CV LAB;  Service: Cardiovascular;  Laterality: Left;   Family History:   Family History  Problem Relation Age  of Onset  . Heart disease Mother        Heart Disease before age 33  . Hyperlipidemia Mother   . Hypertension Mother   . Diabetes Mother   . Heart attack Mother   . Heart disease Father        Heart Disease before age 91  . Hyperlipidemia Father   . Hypertension Father   . Diabetes Father   . Diabetes Sister   . Hypertension Sister   . Diabetes Brother   . Hyperlipidemia Brother   . Heart attack Brother   . Hypertension Sister   . Heart attack Brother   . Colon cancer Child 85  . Other Other        noncontributory for early CAD  . Esophageal cancer Neg  Hx   . Liver disease Neg Hx   . Kidney disease Neg Hx   . Colon polyps Neg Hx    Social History:  reports that she has never smoked. She has never used smokeless tobacco. She reports that she does not drink alcohol and does not use drugs. Allergies  Allergen Reactions  . Aspirin Other (See Comments)    High Doses Mess up her stomach; "makes my bowels have blood in them". Takes 81 mg EC Aspirin   . Penicillins Other (See Comments)    SYNCOPE? , "makes me real weak when I take it; like I'll pass out"  Has patient had a PCN reaction causing immediate rash, facial/tongue/throat swelling, SOB or lightheadedness with hypotension: Yes Has patient had a PCN reaction causing severe rash involving mucus membranes or skin necrosis: no Has patient had a PCN reaction that required hospitalization no Has patient had a PCN reaction occurring within the last 10 years: no If all of the above  . Amlodipine Swelling  . Bactrim [Sulfamethoxazole-Trimethoprim] Rash  . Contrast Media [Iodinated Diagnostic Agents] Itching  . Iron Itching and Other (See Comments)    "they gave me iron in dialysis; had to give me Benadryl cause I had to have the iron" (05/02/2012)  . Nitrofurantoin Hives  . Tylenol [Acetaminophen] Itching and Other (See Comments)    Makes her feet on fire per pt  . Gabapentin Other (See Comments)    Unknown reaction  .  Hydralazine Itching  . Dexilant [Dexlansoprazole] Other (See Comments)    Upset stomach  . Levaquin [Levofloxacin In D5w] Rash  . Morphine And Related Itching and Other (See Comments)    Itching in feet  . Plavix [Clopidogrel Bisulfate] Rash  . Protonix [Pantoprazole Sodium] Rash  . Venofer [Ferric Oxide] Itching and Other (See Comments)    Patient reports using Benadryl prior to doses as [place]   Prior to Admission medications   Medication Sig Start Date End Date Taking? Authorizing Provider  ALPRAZolam Duanne Moron) 0.5 MG tablet Take 0.5 mg by mouth at bedtime as needed for anxiety.    [provider]  amiodarone (PACERONE) 200 MG tablet Take 1 tablet (200 mg total) by mouth daily. Starting July 2 12/17/19   Ahmed Prima Fransisco Hertz, PA-C  aspirin EC 81 MG tablet Take 81 mg by mouth daily.  09/27/19   Rehman, Mechele Dawley, MD  cyclobenzaprine (FLEXERIL) 5 MG tablet Take 5 mg by mouth 3 (three) times daily as needed. 05/08/20   [provider]  dicyclomine (BENTYL) 10 MG capsule Take 10 mg by mouth 4 (four) times daily -  before meals and at bedtime.    [provider]  fluticasone (FLONASE) 50 MCG/ACT nasal spray Place 1 spray into both nostrils at bedtime as needed for allergies.     [provider]  gabapentin (NEURONTIN) 100 MG capsule Take 100 mg by mouth daily.  04/16/20   [provider]  isosorbide mononitrate (IMDUR) 60 MG 24 hr tablet Take 1 tablet (60 mg total) by mouth 2 (two) times daily. 10/22/19 02/11/21  Herminio Commons, MD  lidocaine-prilocaine (EMLA) cream Apply 1 application topically every Monday, Wednesday, and Friday. Prior to dialysis    [provider]  loratadine (CLARITIN) 10 MG tablet Take 1 tablet (10 mg total) by mouth daily as needed for allergies. 07/14/18   Hosie Poisson, MD  multivitamin (RENA-VIT) TABS tablet Take 1 tablet by mouth daily.    [provider]  nitroGLYCERIN (NITROSTAT) 0.4 MG SL tablet Place  1 tablet (0.4 mg total) under the tongue every 5 (five) minutes as needed for chest pain. 05/07/20   Johnson, Clanford L, MD  nystatin-triamcinolone (MYCOLOG II) cream Apply 1 application topically 2 (two) times daily. 02/25/20   Rogene Houston, MD  omeprazole (PRILOSEC) 40 MG capsule Take 1 capsule (40 mg total) by mouth daily before breakfast. 03/31/20   Laurine Blazer A, PA-C  ondansetron (ZOFRAN-ODT) 4 MG disintegrating tablet Take 4 mg by mouth every 8 (eight) hours as needed for nausea or vomiting.  03/05/19   [provider]  simvastatin (ZOCOR) 20 MG tablet TAKE 1 TABLET BY MOUTH ONCE DAILY AT BEDTIME Patient taking differently: Take 20 mg by mouth at bedtime.  07/22/19   Herminio Commons, MD   Current Facility-Administered Medications  Medication Dose Route Frequency Provider Last Rate Last Admin  . [START ON 06/13/2020] Chlorhexidine Gluconate Cloth 2 % PADS 6 each  6 each Topical Q0600 Donato Heinz, MD      . nitroGLYCERIN (NITROSTAT) SL tablet 0.4 mg  0.4 mg Sublingual Q5 min PRN Varney Biles, MD   0.4 mg at 06/12/20 1748  . nitroGLYCERIN 50 mg in dextrose 5 % 250 mL (0.2 mg/mL) infusion  0-200 mcg/min Intravenous Titrated Varney Biles, MD       Current Outpatient Medications  Medication Sig Dispense Refill  . ALPRAZolam (XANAX) 0.5 MG tablet Take 0.5 mg by mouth at bedtime as needed for anxiety.    Marland Kitchen amiodarone (PACERONE) 200 MG tablet Take 1 tablet (200 mg total) by mouth daily. Starting July 2 90 tablet 3  . aspirin EC 81 MG tablet Take 81 mg by mouth daily.     . cyclobenzaprine (FLEXERIL) 5 MG tablet Take 5 mg by mouth 3 (three) times daily as needed.    . dicyclomine (BENTYL) 10 MG capsule Take 10 mg by mouth 4 (four) times daily -  before meals and at bedtime.    . fluticasone (FLONASE) 50 MCG/ACT nasal spray Place 1 spray into both nostrils at bedtime as needed for allergies.     Marland Kitchen gabapentin (NEURONTIN) 100 MG capsule Take 100 mg by mouth daily.     .  isosorbide mononitrate (IMDUR) 60 MG 24 hr tablet Take 1 tablet (60 mg total) by mouth 2 (two) times daily. 60 tablet 6  . lidocaine-prilocaine (EMLA) cream Apply 1 application topically every Monday, Wednesday, and Friday. Prior to dialysis    . loratadine (CLARITIN) 10 MG tablet Take 1 tablet (10 mg total) by mouth daily as needed for allergies. 30 tablet 0  . multivitamin (RENA-VIT) TABS tablet Take 1 tablet by mouth daily.    . nitroGLYCERIN (NITROSTAT) 0.4 MG SL tablet Place 1 tablet (0.4 mg total) under the tongue every 5 (five) minutes as needed for chest pain. 15 tablet 1  . nystatin-triamcinolone (MYCOLOG II) cream Apply 1 application topically 2 (two) times daily. 30 g 0  . omeprazole (PRILOSEC) 40 MG capsule Take 1 capsule (40 mg total) by mouth daily before breakfast. 30 capsule 5  . ondansetron (ZOFRAN-ODT) 4 MG disintegrating tablet Take 4 mg by mouth every 8 (eight) hours as needed for nausea or vomiting.     . simvastatin (ZOCOR) 20 MG tablet TAKE 1 TABLET BY MOUTH ONCE DAILY AT BEDTIME (Patient taking differently: Take 20 mg by mouth at bedtime. ) 90 tablet 2   Labs: Basic Metabolic Panel: Recent Labs  Lab 06/12/20 1505  NA  141  K 4.9  CL 98  CO2 23  GLUCOSE 80  BUN 49*  CREATININE 9.26*  CALCIUM 9.0   Liver Function Tests: No results for input(s): AST, ALT, ALKPHOS, BILITOT, PROT, ALBUMIN in the last 168 hours. No results for input(s): LIPASE, AMYLASE in the last 168 hours. No results for input(s): AMMONIA in the last 168 hours. CBC: Recent Labs  Lab 06/12/20 1505  WBC 5.3  HGB 8.4*  HCT 28.4*  MCV 116.4*  PLT 256   Cardiac Enzymes: No results for input(s): CKTOTAL, CKMB, CKMBINDEX, TROPONINI in the last 168 hours. CBG: No results for input(s): GLUCAP in the last 168 hours. Iron Studies: No results for input(s): IRON, TIBC, TRANSFERRIN, FERRITIN in the last 72 hours. Studies/Results: DG Chest Port 1 View  Result Date: 06/12/2020 CLINICAL DATA:  Evaluate  for volume overload. EXAM: PORTABLE CHEST 1 VIEW COMPARISON:  May 13, 2020 FINDINGS: No pneumothorax. Scattered atelectasis in the bases. Mild pulmonary venous congestion without overt edema. No pneumothorax. No other acute abnormalities. IMPRESSION: Cardiomegaly and pulmonary venous congestion without overt edema Bibasilar atelectasis. Electronically Signed   By: Dorise Bullion III M.D   On: 06/12/2020 18:28    ROS: Pertinent items are noted in HPI. Physical Exam: Vitals:   06/12/20 1900 06/12/20 1930 06/12/20 2000 06/12/20 2100  BP: (!) 210/88 (!) 195/89 (!) 201/96 (!) 191/97  Pulse: 86 85 87 91  Resp: 17 18 18  (!) 21  Temp:      TempSrc:      SpO2: 93% 100% 100% 100%  Weight:      Height:          Weight change:  No intake or output data in the 24 hours ending 06/12/20 2124 BP (!) 191/97   Pulse 91   Temp 98.4 F (36.9 C) (Oral)   Resp (!) 21   Ht 5\' 1"  (1.549 m)   Wt 61.2 kg   SpO2 100%   BMI 25.51 kg/m  General appearance: alert, cooperative and mild distress Head: Normocephalic, without obvious abnormality, atraumatic Resp: rales bibasilar Cardio: regular rate and rhythm and no rub GI: soft, non-tender; bowel sounds normal; no masses,  no organomegaly Extremities: edema trace pretibial edema bilaterally and LUE AVF without thrill or bruit Dialysis Access: LUE AVF  Dialysis Orders: Center: [facility]  on MWF . EDW 61kg HD Bath 2K/2.5Ca  Time 3.5 hrs Heparin none. Access LUE AVF BFR 300 DFR 600    Hectoral  2.5 mcg IV/HD Epogen 8000  Units IV/HD      Assessment/Plan: 1.  SOB and hypertensive urgency- will plan on urgent HD after ED physician places HD catheter.  Recommend placing on BiPAP as she has a history of acute respiratory failure requiring intubation in the past with similar presentation.  Will plan to UF as BP tolerates (has h/o dropping Bp with HD).  May need SDU placement if UF doesn't improve her respiratory status and hypertension.  She was given NTG  in the ED and ok to give clonidine po 0.1 and follow BP 2. Chest pain- need to cycle troponins but likely due to HTN and volume overload. 3.  ESRD -  Plan for urgent HD tonight 4.  Hypertension/volume  - HD and UF as above 5.  Anemia  - will dose with aranesp on HD 6.  Metabolic bone disease -  Continue with outpatient meds 7.  Nutrition -  Renal diet 8. Vascular access- clotted LUE AVF and will need declot  prior to discharge or [facility] placement.  Donetta Potts, MD [place] Pager [phone] 06/12/2020, 9:24 PM

## 2020-06-13 ENCOUNTER — Encounter (HOSPITAL_COMMUNITY): Payer: Self-pay | Admitting: Psychiatry

## 2020-06-13 ENCOUNTER — Observation Stay (HOSPITAL_COMMUNITY): Payer: Medicare HMO

## 2020-06-13 DIAGNOSIS — Z992 Dependence on renal dialysis: Secondary | ICD-10-CM

## 2020-06-13 DIAGNOSIS — R778 Other specified abnormalities of plasma proteins: Secondary | ICD-10-CM | POA: Diagnosis not present

## 2020-06-13 DIAGNOSIS — I161 Hypertensive emergency: Secondary | ICD-10-CM

## 2020-06-13 DIAGNOSIS — J810 Acute pulmonary edema: Secondary | ICD-10-CM | POA: Diagnosis not present

## 2020-06-13 DIAGNOSIS — N186 End stage renal disease: Secondary | ICD-10-CM

## 2020-06-13 DIAGNOSIS — D631 Anemia in chronic kidney disease: Secondary | ICD-10-CM

## 2020-06-13 DIAGNOSIS — T82868A Thrombosis of vascular prosthetic devices, implants and grafts, initial encounter: Secondary | ICD-10-CM

## 2020-06-13 LAB — GLUCOSE, CAPILLARY: Glucose-Capillary: 138 mg/dL — ABNORMAL HIGH (ref 70–99)

## 2020-06-13 LAB — CBC
HCT: 31 % — ABNORMAL LOW (ref 36.0–46.0)
Hemoglobin: 9.7 g/dL — ABNORMAL LOW (ref 12.0–15.0)
MCH: 35.1 pg — ABNORMAL HIGH (ref 26.0–34.0)
MCHC: 31.3 g/dL (ref 30.0–36.0)
MCV: 112.3 fL — ABNORMAL HIGH (ref 80.0–100.0)
Platelets: 206 10*3/uL (ref 150–400)
RBC: 2.76 MIL/uL — ABNORMAL LOW (ref 3.87–5.11)
RDW: 24 % — ABNORMAL HIGH (ref 11.5–15.5)
WBC: 9.7 10*3/uL (ref 4.0–10.5)

## 2020-06-13 LAB — RENAL FUNCTION PANEL
Albumin: 2.8 g/dL — ABNORMAL LOW (ref 3.5–5.0)
Anion gap: 21 — ABNORMAL HIGH (ref 5–15)
BUN: 57 mg/dL — ABNORMAL HIGH (ref 8–23)
CO2: 21 mmol/L — ABNORMAL LOW (ref 22–32)
Calcium: 9.4 mg/dL (ref 8.9–10.3)
Chloride: 100 mmol/L (ref 98–111)
Creatinine, Ser: 10.26 mg/dL — ABNORMAL HIGH (ref 0.44–1.00)
GFR, Estimated: 3 mL/min — ABNORMAL LOW (ref >60)
Glucose, Bld: 75 mg/dL (ref 70–99)
Phosphorus: 8 mg/dL — ABNORMAL HIGH (ref 2.5–4.6)
Potassium: 5.6 mmol/L — ABNORMAL HIGH (ref 3.5–5.1)
Sodium: 142 mmol/L (ref 135–145)

## 2020-06-13 LAB — TROPONIN I (HIGH SENSITIVITY)
Troponin I (High Sensitivity): 155 ng/L (ref <18)
Troponin I (High Sensitivity): 124 ng/L (ref <18)

## 2020-06-13 LAB — HEPATITIS B SURFACE ANTIGEN: Hepatitis B Surface Ag: NONREACTIVE

## 2020-06-13 LAB — LIPID PANEL
Cholesterol: 136 mg/dL (ref 0–200)
HDL: 50 mg/dL (ref >40)
LDL Cholesterol: 64 mg/dL (ref 0–99)
Total CHOL/HDL Ratio: 2.7 RATIO
Triglycerides: 110 mg/dL (ref <150)
VLDL: 22 mg/dL (ref 0–40)

## 2020-06-13 IMAGING — DX DG CHEST 1V PORT
1 series · 1 of 1 positions shown · non-contrast
Comparison: [DATE]

CLINICAL DATA: Central line placement

EXAM:
PORTABLE CHEST 1 VIEW

[chest ap]
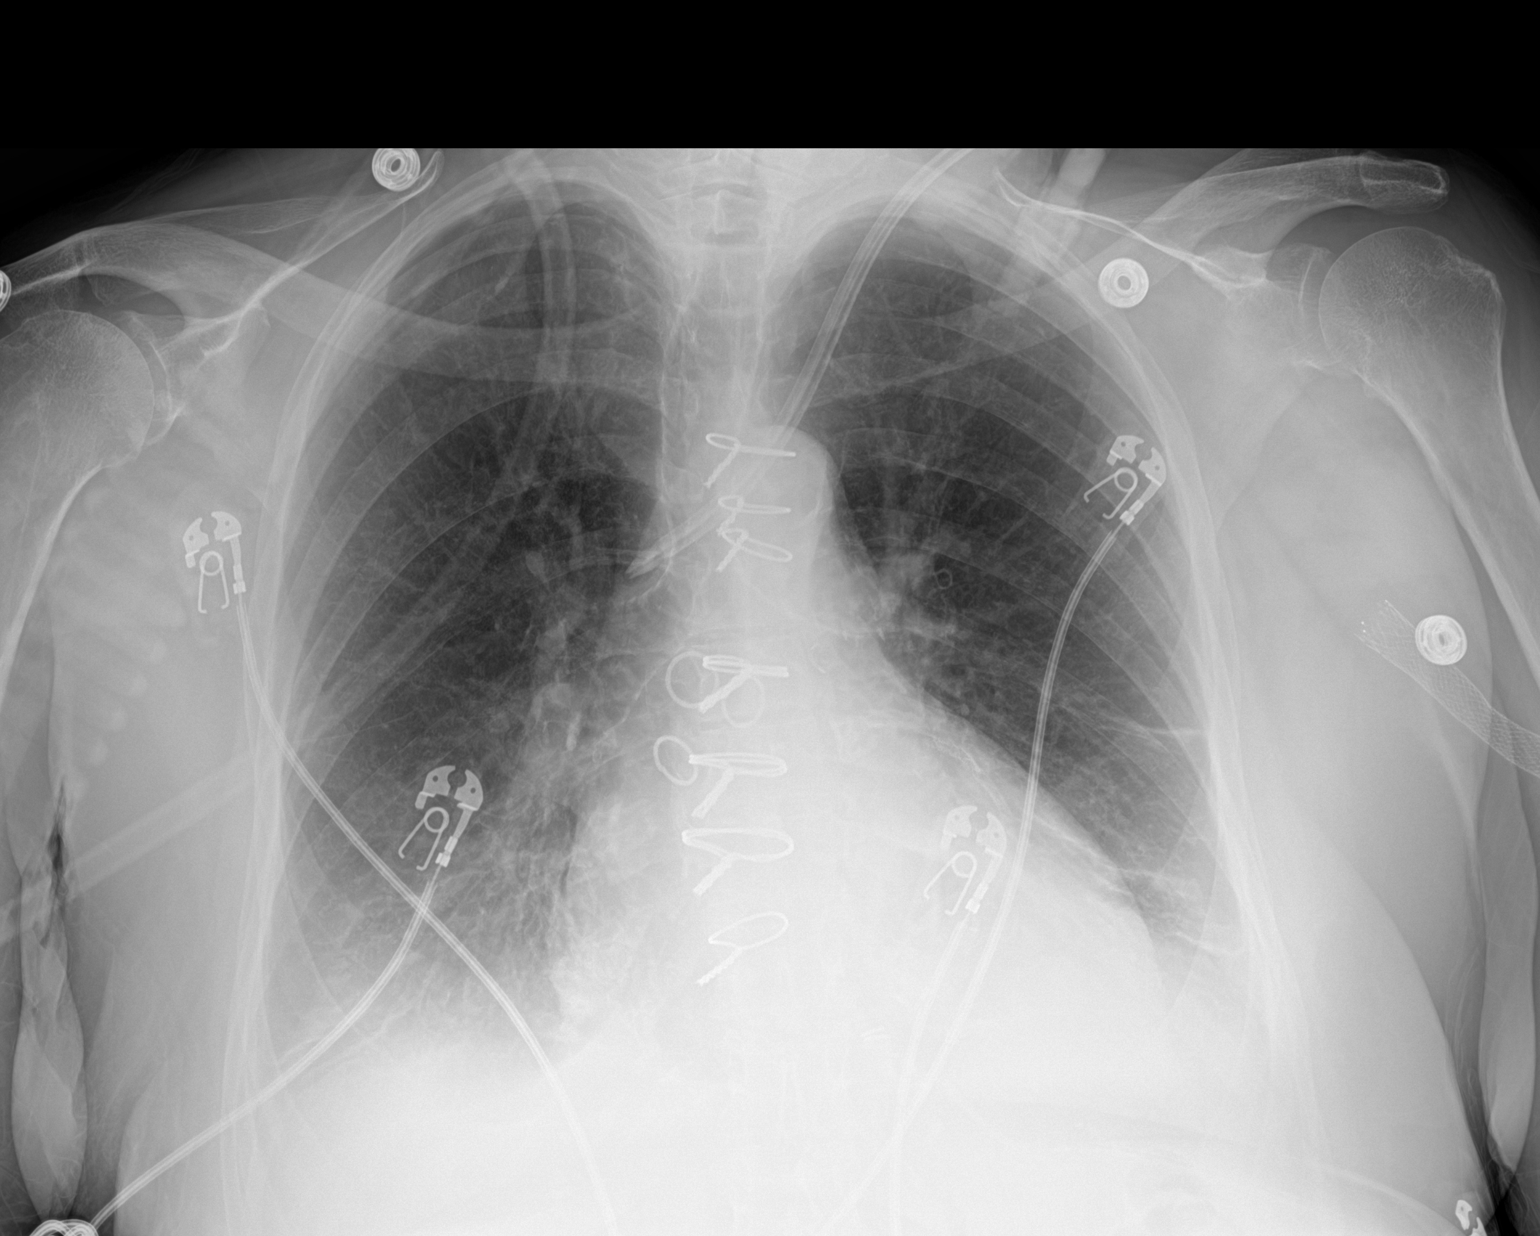

[1 of 1 positions shown; findings below may reference images not displayed]

FINDINGS: Heart size is enlarged but unchanged from prior study. The patient
is status post prior CABG. There is a new left-sided central venous
catheter with tip projecting over the left brachiocephalic vein.
There is no left-sided pneumothorax. There are small to
moderate-sized bilateral pleural effusions with adjacent airspace
disease, favored to represent atelectasis. Aortic calcifications are
noted.
IMPRESSION: 1. New left-sided central venous catheter as above with no evidence
for pneumothorax.
2. Small to moderate-sized bilateral pleural effusions with adjacent
airspace disease, favored to represent atelectasis.

## 2020-06-13 MED ORDER — SODIUM CHLORIDE 0.9 % IV SOLN: 100.0000 mL | INTRAVENOUS | Status: DC | PRN

## 2020-06-13 MED ORDER — GABAPENTIN 100 MG PO CAPS
100.0000 mg | ORAL_CAPSULE | Freq: Every day | ORAL | Status: DC
  Administered 2020-06-13 – 2020-06-16 (×3): 100 mg via ORAL
  Filled 2020-06-13 (×6): qty 1

## 2020-06-13 MED ORDER — DICYCLOMINE HCL 10 MG PO CAPS
10.0000 mg | ORAL_CAPSULE | Freq: Two times a day (BID) | ORAL | Status: DC
  Administered 2020-06-13 – 2020-06-20 (×15): 10 mg via ORAL
  Filled 2020-06-13 (×16): qty 1

## 2020-06-13 MED ORDER — ALPRAZOLAM 0.5 MG PO TABS: 0.5000 mg | ORAL_TABLET | Freq: Every evening | ORAL | Status: DC | PRN

## 2020-06-13 MED ORDER — AMIODARONE HCL 200 MG PO TABS
200.0000 mg | ORAL_TABLET | Freq: Every day | ORAL | Status: DC
  Administered 2020-06-13 – 2020-06-20 (×8): 200 mg via ORAL
  Filled 2020-06-13 (×9): qty 1

## 2020-06-13 MED ORDER — HEPARIN SODIUM (PORCINE) 5000 UNIT/ML IJ SOLN
5000.0000 [IU] | Freq: Three times a day (TID) | INTRAMUSCULAR | Status: DC
  Administered 2020-06-13 (×2): 5000 [IU] via SUBCUTANEOUS
  Filled 2020-06-13 (×4): qty 1

## 2020-06-13 MED ORDER — SEVELAMER CARBONATE 800 MG PO TABS
800.0000 mg | ORAL_TABLET | Freq: Three times a day (TID) | ORAL | Status: DC
  Administered 2020-06-13 – 2020-06-14 (×6): 800 mg via ORAL
  Filled 2020-06-13 (×7): qty 1

## 2020-06-13 MED ORDER — SIMVASTATIN 20 MG PO TABS
20.0000 mg | ORAL_TABLET | Freq: Every day | ORAL | Status: DC
  Administered 2020-06-13 – 2020-06-19 (×7): 20 mg via ORAL
  Filled 2020-06-13 (×7): qty 1

## 2020-06-13 MED ORDER — LIDOCAINE-PRILOCAINE 2.5-2.5 % EX CREA: 1.0000 "application " | TOPICAL_CREAM | CUTANEOUS | Status: DC | PRN

## 2020-06-13 MED ORDER — LORATADINE 10 MG PO TABS
10.0000 mg | ORAL_TABLET | Freq: Every day | ORAL | Status: DC | PRN
  Administered 2020-06-13: 10 mg via ORAL
  Filled 2020-06-13: qty 1

## 2020-06-13 MED ORDER — PENTAFLUOROPROP-TETRAFLUOROETH EX AERO: 1.0000 "application " | INHALATION_SPRAY | CUTANEOUS | Status: DC | PRN

## 2020-06-13 MED ORDER — HEPARIN SODIUM (PORCINE) 1000 UNIT/ML DIALYSIS: 1000.0000 [IU] | INTRAMUSCULAR | Status: DC | PRN

## 2020-06-13 MED ORDER — ASPIRIN EC 81 MG PO TBEC
81.0000 mg | DELAYED_RELEASE_TABLET | Freq: Every day | ORAL | Status: DC
  Administered 2020-06-13: 81 mg via ORAL
  Filled 2020-06-13: qty 1

## 2020-06-13 MED ORDER — ISOSORBIDE MONONITRATE ER 60 MG PO TB24
60.0000 mg | ORAL_TABLET | Freq: Two times a day (BID) | ORAL | Status: DC
  Administered 2020-06-13 – 2020-06-18 (×11): 60 mg via ORAL
  Filled 2020-06-13 (×6): qty 1
  Filled 2020-06-13: qty 2
  Filled 2020-06-13 (×5): qty 1

## 2020-06-13 MED ORDER — ALTEPLASE 2 MG IJ SOLR: 2.0000 mg | Freq: Once | INTRAMUSCULAR | Status: DC | PRN

## 2020-06-13 MED ORDER — LIDOCAINE HCL (PF) 1 % IJ SOLN: 5.0000 mL | INTRAMUSCULAR | Status: DC | PRN

## 2020-06-13 MED ORDER — HEPARIN SODIUM (PORCINE) 1000 UNIT/ML IJ SOLN
INTRAMUSCULAR | Status: AC
  Filled 2020-06-13: qty 4

## 2020-06-13 NOTE — Progress Notes (Addendum)
Family Medicine Teaching Service Daily Progress Note Intern Pager: [phone]  Patient name: Shawna [name] Medical record number: [id number] Date of birth: 1940-12-03 Age: 79 y.o. Gender: female  Primary Care Provider: [name], MD Consultants: nephri, Vascular, IR Code Status: full  Pt Overview and Major Events to Date:  12/4 - Admitted to [facility] [place], HD  Assessment and Plan: Shawna [name] is a 79 y.o. female presenting with Clotted AVF. PMH is significant for ESRD on HD (MWF), HTN, HLD, GERD, CAD s/p CABG in 2013 and chronic occlusion of D1 stent and PDA (75% occlusion on 1/20) with patent grafts, Afib not on anticoagulation due to prior GI bleed, Anxiety, Anemia, and Chronic GI bleed.  Acute Asymptomatic Anemia  Anemia of Chronic Kidney Disease: Hgb 7.1 this AM, down from 9.7 yesterday. Baseline 8-9. Denies symptoms. Denies any acute bleed and no signs of acute bleed on exam. Per chart review, did require 2U pRBC blood transfusion in setting of symptomatic anemia of 6.9 without cause 05/06/20, FOBT negative at that time. Recommended to follow up with GI outpatient. Does have history of GI bleed. -Repeat H&H to confirm - Type and screen  -Aranesp per nephrology with HD  - transfuse 1U if repeat <8 - hold ASA and heparin if transfuse - Q4 vitals - If transfuse, consult GI - obtain FOBT  Hypertensive Emergency 2/2 missed HD due to Clotted AVF  Essential HTN BP's initially with systolic of 829'F. S/p temp HD catheter placement and urgent HD yesterday. Improved BP's overnight with BP 119/43 this AM. Improved symptoms after receiving HD as well. Vascular surgery consulted and recommended thrombolysis by IR. IR consulted and plan to see this weekend. Temp cath in place  with no acute bleed or discharge. - continue HD per nephro - monitor BP's  - Vascular consulted, appreciate recs, s/o - IR consulted, to see this weekend, will f/u recs -Keep oxygen saturation more than 92% -Renal diet,  1200cc fluid restriction   Acute hypoxic respiratory failure, resolved Improved with PRN Bipap and HD yesterday. Currently on room air this AM and appears comfortable and speaking in full sentences. Per nephrology note, hx of acute respiratory failure requiring intubation for similar presentation.  -Continue cardiac monitoring -EKG as needed -Strict I/O  Chest tightness  Elevated troponin: resolved CAD s/p CABG 2013, DES 2019. Trops trended down after HD [phone]). Suspect initial symptoms and abnormal trop due to demand ischemia in setting of known underlying CAD of native vessels and hypervolemia. EKG without ST changes. She follows with cardiology and last seen August 2021. Home meds:  aspirin 81 mg daily and Imdur 60 mg 2 times daily.  -hold home aspirin pending repeat H&H -Continue home Imdur  -repeat EKG if recurs -Nitroglycerin sublingual 0.4 mg as needed for chest tightness  ESRD on HD HD (MWF) at [facility]. S/p urgent HD on 12/4. Next HD scheduled for Monday 12/6. Temp HD catheter placed in ED. IR to see for thrombolysis for clotted AVF. -Nephrology Consulted.  Appreciate recommendations and management. -Avoid nephrotoxic meds -Daily RFP -Continue Renvela.  HLD  Home meds:  simvastatin 20 mg daily at bedtime. Lipid panel 12/4: Chol 136, HDL 50, LDL 64, Trig 110 -Continue home simvastatin -Repeat Lipid Panel.  GERD Home meds include Prilosec 40 mg. Protonix not started due to allergy -Hold Prilosec  Chronic Diastolic HF  Last echo in June 2020 showed normal LV systolic function of 60 to 65% with mild LV hypertrophy and impaired relaxation left ventricle and  low normal systolic function of right ventricle, left atrium mildly dilated.  -HTN control as above   Atrial fibrillation  Not on anticoagulation due to hx of GIB. Home meds: amiodarone 200 mg daily -Continue amiodarone 200 mg daily -Continue cardiac monitoring - follow up TSH  Anxiety Disorder Home  meds: Xanax 0.5 mg at bedtime as needed for anxiety. Appears she has not had filled in some time. Given infrequent use, will discontinue and reorder one time dose if patient requests.  Hx of GI bleed from colonic AVM's Pt has history of small bowel ischemic injury and ileo-colonic anastomosis few years ago.  Last colonoscopy on 02/2019 showed Diverticulosis in colon, Patent end-to-side ileo-colonic anastomosis, Two small polyps in the proximal ascending colon and One large polyp in the proximal transverse colon which were removed.  Denies any acute bleed. - consider repeat FOBT pending H&H  FEN/GI: Renal diet with fluid restriction of 1200 mL Prophylaxis: Heparin  Disposition: Med telemetry observation  Subjective:  Patient notes that she is doing well this AM. Denies any chest tightness or SOB. She denies any acute bleed or symptoms. Denies any acute concerns or complains.  Objective: Temp:  [97.5 F (36.4 C)-99.1 F (37.3 C)] 98.7 F (37.1 C) (12/05 0441) Pulse Rate:  [66-83] 73 (12/05 0441) Resp:  [9-20] 14 (12/05 0441) BP: (89-152)/(43-74) 119/43 (12/05 0441) SpO2:  [92 %-100 %] 97 % (12/05 0441) Weight:  [62.3 kg] 62.3 kg (12/04 0826) Physical Exam: General: pleasant elderly woman, lying comfortably in bed, well nourished, well developed, in no acute distress with non-toxic appearance CV: regular rate and rhythm without murmurs, rubs, or gallops, no significant lower extremity edema, 2+ radial and pedal pulses bilaterally Lungs: clear to auscultation bilaterally with normal work of breathing on room air Abdomen: soft, non-tender Skin: warm, dry, temp HD cath in left neck without erythema, edema, discharge, or bleeding, IV line in right upper arm without skin changes or bleeding, AVF in left arm with some bruising and firmness with no palpable thrill but no signs of acute bleed Extremities: warm and well perfused Neuro: Alert and oriented, speech normal   Laboratory: Recent  Labs  Lab 06/12/20 1505 06/13/20 0956 06/14/20 0154  WBC 5.3 9.7 5.4  HGB 8.4* 9.7* 7.1*  HCT 28.4* 31.0* 21.6*  PLT 256 206 111*   Recent Labs  Lab 06/12/20 1505 06/13/20 0352 06/14/20 0154  NA 141 142 138  K 4.9 5.6* 4.3  CL 98 100 98  CO2 23 21* 23  BUN 49* 57* 50*  CREATININE 9.26* 10.26* 8.48*  CALCIUM 9.0 9.4 8.7*  GLUCOSE 80 75 109*   TSH 5.792  Imaging/Diagnostic Tests: No results found.   Mina Marble Shannon, DO 06/14/2020, 6:06 AM PGY-3, [place] Intern pager: [phone], text pages welcome

## 2020-06-13 NOTE — H&P (Addendum)
[facility] Admission History and Physical Service Pager: [phone]  Patient name: Shawna [name] Medical record number: [id number] Date of birth: 1941-03-28 Age: 79 y.o. Gender: female  Primary Care Provider: [name], MD Consultants: Nephrology Code Status: Full Preferred Emergency Contact: Husband  Chief Complaint: Clotted AVF  Assessment and Plan: Shawna [name] is a 79 y.o. female presenting with Clotted AVF. PMH is significant for ESRD on HD (MWF), HTN, HLD, GERD, CAD, Afib, Anxiety, Anemia, and Chronic GI bleed.  Hypertensive Emergency secondary to missed Dialysis due to Clotted AVF Pt has ESRD on HD (MWF).  Patient went to her HD yesterday and was noted to have clotted AVF and then sent to [facility] ED for dialysis and management of AVF.  At admission, her BPs are high with systolic of 789F and after that it has been fluctuating with systolic between 810-175 and diastolic between 42 to 102 mmHg.  She was satting at 93-100% on room air. While waiting she also started developing SOB and chest tightness.Chest x-ray revealed cardiomegaly and pulmonary venous congestion without overt edema. Patient was given NTG for chest pain with resoluation. Also provided clonidine for HTN in the ED. Nephrology was consulted for urgent dialysis in the setting of hypertensive urgency and volume overload.  Nephrology recommended placing HD catheter and urgent HD tonight.  High blood pressure most likely due to volume overload and missed dialysis. VVS was consulted by ED provider and Dr. Arita Miss to see patient in the morning.  -Admit to FPTS, Unit -med telemetry observation, Attending Dr. Owens Shark -Nephrology consulted.  Appreciate recommendations and management. -Urgent HD tonight  -Vascular consult for clotted AVF in the morning.  -Am RFP, CBC -Keep oxygen saturation more than 92% -Renal diet, 1200cc fluid restriction   Dyspnea After admission she reported to have her some chest  tightness and shortness of breath. Pt has H/o CAD and pulmonary Edema. Nephrology placed patient on bipap in ED for her SOB. Per ED RN, patient was not having any respiratory distress when on BiPAP. Per husband, patient required some oxygen via nasal canula prior to starting BiPAP; however, nothing charted in vital signs. Per nephrology note, placed for hx of acute respiratory failure requiring intubation for similar presentation. She was speaking in full sentences on admission interview.  -Continue cardiac monitoring -EKG as needed -Strict I/O  Chest tightness CAD s/p CABG 2013, DES 2019 Patient complained of chest tightness today which was resolved after sublingual NTG.  EKG doesn't show ST elevation. Troponin at admission was 59, second troponin pending. Most likely due to demand ischemia in the setting of hypertensive emergency.  Low concern for ACS but we will keep on monitoring given patient's strong history of coronary disease. She follows with cardiology and last seen August 2021. Patient takes aspirin 81 mg daily and Imdur 60 mg 2 times daily.  Patient denied chest pain upon admission interview with primary team.  -Trending troponin -Continue home aspirin -Continue home Imdur  -Low threshold for cardiac consult if troponin rises. -Nitroglycerin sublingual 0.4 mg as needed for chest tightness  ESRD on HD Pt goes to HD (MWF) at [place]. Her recent creatinine is 9.26 and BUN is 49. She couldn't have dialysis today due to clotted AVF. Also, Her last dialysis on 58/5 was complicated by low BP and the couldn't remove the target 3 L at that time. B/L Basilar rales noted on examination today. Nephrology following and will do urgent HD tonight.   -Nephrology Consulted.  Appreciate recommendations and management. -Avoid nephrotoxic meds -Daily RFP -Patient will have urgent HD tonight. -Continue Renvela.  Essential HTN Her BP's have been labile systolic BP between 517- 616 and diastolic  between 07-371 mmHg.  She was given clonidine. Her high BP most likely due to volume overload secondary to missed HD due to clotted AVF. -Monitor BP -Clonidine as needed. -Urgent HD  HLD  Her home medication includes simvastatin 20 mg daily at bedtime. Her Last lipid panel was done in 2019 which was normal. -Continue home simvastatin -Repeat Lipid Panel.  GERD Her home medication includes Prilosec 40 mg. Protonix not started due to allergy -Hold Prilosec  Chronic Diastolic HF  Her last echo was done in June 2020 please showed normal left ventricular systolic function of 60 to 65% with mild left ventricular hypertrophy and impaired relaxation left ventricle and low normal systolic function of right ventricle, left atrium mildly dilated.  -HTN control as above   Atrial fibrillation  Not on anticoagulation due to hx of GIB. Sinus rhythm on EKG today at admission. Her home medication include amiodarone 200 mg daily -Continue amiodarone 200 mg daily -Continue cardiac monitoring.  Anxiety Disorder Patient takes Xanax 0.5 mg at bedtime as needed for anxiety -Continue home Xanax   Chronic Anemia Chronic due to chronic kidney disease. Her Hb on admission 8.4. Usually between (8-9). Last Iron studies were done in October 2021 which showed normal iron and low TIBC of 215 and high Ferritin 651. Vitamin B12 and folate was normal.  -A.m. CBC -Aranesp per nephrology with HD   Hx of GI bleed from colonic AVM's Pt has history of small bowel ischemic injury and ileo-colonic anastomosis few years ago.  Last colonoscopy on 02/2019 showed Diverticulosis in colon, Patent end-to-side ileo-colonic anastomosis, Two small polyps in the proximal ascending colon and One large polyp in the proximal transverse colon which were removed. No concern for GIB at this time.   FEN/GI: Renal diet with fluid restriction of 1200 mL Prophylaxis: Heparin  Disposition: Med telemetry observation  History of Present  Illness:  Shawna [name] is a 79 y.o. female presenting with clotted AVF. Patient states since Thursday morning she has been having pain in the left arm with some difficulty breathing.  She states she went to the HD today and the AVF was clotted.  She had last HD on 06/10/20 when she had blackout hypotensive episode and could not remove much fluid.  She denies any fever, chills, headache, blurry vision, abdominal pain, dizziness, nausea, vomiting.  She states that she had some chest pain during admission but now she just has some left arm pain.   Review Of Systems: Per HPI with the following additions:   Review of Systems  Constitutional: Negative for chills and fever.  HENT: Tinnitus: green.   Respiratory: Positive for shortness of breath.   Cardiovascular: Positive for chest pain.  Gastrointestinal: Negative for abdominal pain, nausea and vomiting.    Patient Active Problem List   Diagnosis Date Noted  . Hypertension 06/12/2020  . Irritable bowel syndrome 02/25/2020  . Pulmonary edema 10/14/2019  . Adenomatous duodenal polyp 09/10/2019  . History of GI bleed 09/10/2019  . Angina pectoris ([place]) 06/05/2019  . Chest pain 06/03/2019  . Rectal bleeding 05/14/2019  . Small intestinal bacterial overgrowth 05/14/2019  . Iron deficiency anemia 04/02/2019  . GI bleed 03/08/2019  . Gastrointestinal hemorrhage with melena 03/06/2019  . Acute respiratory failure with hypoxia ([name]) 12/25/2018  . Elevated troponin 12/14/2018  .  Chest pain at rest 07/13/2018  . Hand steal syndrome ([place]) 08/01/2017  . Macrocytic anemia 07/14/2017  . Coronary artery disease of bypass graft of native heart with stable angina pectoris ([place]) 06/05/2017  . Mesenteric ischemia ([place])   . Diverticulitis   . SBO (small bowel obstruction) ([place]) 03/22/2017  . Enteritis   . Complication of vascular access for dialysis 03/19/2017  . Preoperative clearance 01/25/2017  . Symptomatic anemia 10/24/2016  . H/O non-ST elevation  myocardial infarction (NSTEMI) 10/24/2016  . Fluid overload 10/10/2016  . Complication from renal dialysis device 10/10/2016  . Non-ST elevation MI (NSTEMI) ([place])   . Encounter for fitting and adjustment of vascular catheter   . Heme positive stool   . Demand ischemia ([place]) 07/27/2016  . Hypertensive emergency 07/08/2016  . Acute on chronic respiratory failure with hypoxia ([place])   . Cardiac arrest ([place])   . Palliative care encounter   . Goals of care, counseling/discussion   . Hypertensive crisis without congestive heart failure 05/09/2016  . Acute pulmonary edema ([place]) 04/06/2016  . Acute respiratory failure ([place]) 04/06/2016  . Hypertensive crisis 01/27/2016  . History of colon cancer 01/27/2016  . History of ovarian cancer 01/27/2016  . Hypertensive urgency 01/27/2016  . AF (paroxysmal atrial fibrillation) ([place]) 10/14/2015  . Coronary angioplasty status 10/14/2015  . Malignant neoplasm of right ovary ([place]) 10/14/2015  . Narrow complex tachycardia ([place]) 09/08/2015  . SVT (supraventricular tachycardia) ([place]) 09/08/2015  . Influenza A 08/30/2015  . Acute on chronic diastolic CHF (congestive heart failure) ([place]) 05/04/2015  . Unstable angina ([place]) 05/03/2015  . DOE (dyspnea on exertion)   . Essential hypertension   . Pain in joint, lower leg 08/14/2014  . Dacryocystitis 05/29/2013  . Chronic diastolic CHF (congestive heart failure) ([place]) 03/22/2013  . GI bleeding 03/21/2013  . Acute blood loss anemia 03/21/2013  . Vaginal odor 03/12/2013  . Vaginal discharge 03/12/2013  . Occlusion and stenosis of carotid artery without mention of cerebral infarction 01/24/2013  . Hx of CABG 07/05/2012  . Carotid artery disease ([place]) 07/05/2012  . Mitral regurgitation 06/12/2012  . Pneumonia 06/09/2012  . Non-STEMI (non-ST elevated myocardial infarction) ([place]) 06/08/2012  . Ischemic chest pain ([place]) 03/01/2012  . 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 GERD  (gastroesophageal reflux disease) 01/09/2012  . HLD (hyperlipidemia) 01/05/2012  . Atherosclerotic heart disease of native coronary artery without angina pectoris 12/16/2011  . Essential hypertension, benign 12/16/2011  . ESRD on hemodialysis ([place]) 12/16/2011  . Anxiety disorder 05/04/2011  . Anemia in chronic kidney disease 04/29/2011  . Secondary hyperparathyroidism of renal origin ([place]) 04/29/2011  . End stage renal disease ([place]) 04/29/2011  . Gout 04/29/2011  . Hypertensive chronic kidney disease with stage 5 chronic kidney disease or end stage renal disease ([place]) 04/29/2011    Past Medical History: Past Medical History:  Diagnosis Date  . Acute on chronic respiratory failure with hypoxia ([place]) 10/10/2016  . Anxiety   . Arthritis   . AVM (arteriovenous malformation) of colon   . CAD (coronary artery disease)    a. s/p CABG in 2013 b. DES to D1 in 10/2016. c. cath in 07/2018 showing patent grafts with occlusion of D1 at prior stent site and progression of PDA disease --> medical management recommended  . Carotid artery disease ([place])    a. 42-70% LICA, 12/2374   . Chronic anemia   . Chronic bronchitis ([place])   . Chronic diastolic CHF (congestive heart failure) ([place])  a. 02/2012 Echo EF 60-65%, nl wall motion, Gr 1 DD, mod MR  . Colon cancer ([place]) 1992  . Esophageal stricture   . ESRD on hemodialysis ([place])    ESRD due to HTN, started dialysis 2011 and gets HD at [facility] with Dr Hinda Lenis on MWF schedule.  Access is LUA AVF as of Sept 2014.   Marland Kitchen GERD (gastroesophageal reflux disease)   . High cholesterol 12/2011  . History of blood transfusion 07/2011; 12/2011; 01/2012 X 2; 04/2012  . History of gout   . History of lower GI bleeding   . Hypertension   . Iron deficiency anemia   . Mitral regurgitation    a. Moderate by echo, 02/2012  . Myocardial infarction ([place])   . NSVT (nonsustained ventricular tachycardia) ([place])   . Ovarian cancer ([place]) 1992  . PAF (paroxysmal atrial  fibrillation) ([place])   . Pneumonia ~ 2009  . PUD (peptic ulcer disease)   . TIA (transient ischemic attack)     Past Surgical History: Past Surgical History:  Procedure Laterality Date  . A/V SHUNTOGRAM Left 03/19/2019   Procedure: A/V SHUNTOGRAM;  Surgeon: Katha Cabal, MD;  Location: [place] CV LAB;  Service: Cardiovascular;  Laterality: Left;  . ABDOMINAL HYSTERECTOMY  1992  . APPENDECTOMY  06/1990  . AV FISTULA PLACEMENT  07/2009   left upper arm  . AV FISTULA PLACEMENT Right 09/06/2016   Procedure: RIGHT FOREARM ARTERIOVENOUS (AV) GRAFT;  Surgeon: Elam Dutch, MD;  Location: [facility] OR;  Service: Vascular;  Laterality: Right;  . AV FISTULA PLACEMENT N/A 02/24/2017   Procedure: INSERTION OF ARTERIOVENOUS (AV) GORE-TEX GRAFT ARM (BRACHIAL AXILLARY);  Surgeon: Katha Cabal, MD;  Location: [facility] ORS;  Service: Vascular;  Laterality: N/A;  . [place] Right 09/06/2016   Procedure: REMOVAL OF Right Arm ARTERIOVENOUS GORETEX GRAFT and Vein Patch angioplasty of brachial artery;  Surgeon: Angelia Mould, MD;  Location: [place];  Service: Vascular;  Laterality: Right;  . BIOPSY  09/26/2019   Procedure: BIOPSY;  Surgeon: Rogene Houston, MD;  [facility];  Service: Endoscopy;;  . COLON RESECTION  1992  . COLON SURGERY    . COLONOSCOPY N/A 03/09/2019   Procedure: COLONOSCOPY;  Surgeon: Rogene Houston, MD;  [facility];  Service: Endoscopy;  Laterality: N/A;  . CORONARY ANGIOPLASTY WITH STENT PLACEMENT  12/15/11   "2"  . CORONARY ANGIOPLASTY WITH STENT PLACEMENT  y/2013   "1; makes total of 3" (05/02/2012)  . CORONARY ARTERY BYPASS GRAFT  06/13/2012   Procedure: CORONARY ARTERY BYPASS GRAFTING (CABG);  Surgeon: Grace Isaac, MD;  Location: [place];  Service: Open Heart Surgery;  Laterality: N/A;  cabg x four;  using left internal mammary artery, and left leg greater saphenous vein harvested endoscopically  . CORONARY STENT INTERVENTION N/A 10/13/2016    Procedure: Coronary Stent Intervention;  Surgeon: Troy Sine, MD;  Location: [place] CV LAB;  Service: Cardiovascular;  Laterality: N/A;  . DIALYSIS/PERMA CATHETER REMOVAL N/A 04/18/2017   Procedure: DIALYSIS/PERMA CATHETER REMOVAL;  Surgeon: Katha Cabal, MD;  Location: [place] [facility] LAB;  Service: Cardiovascular;  Laterality: N/A;  . DILATION AND CURETTAGE OF UTERUS    . ESOPHAGOGASTRODUODENOSCOPY  01/20/2012   Procedure: ESOPHAGOGASTRODUODENOSCOPY (EGD);  Surgeon: Ladene Artist, MD,FACG;  Location: [facility] ENDOSCOPY;  Service: Endoscopy;  Laterality: N/A;  . ESOPHAGOGASTRODUODENOSCOPY N/A 03/26/2013   Procedure: ESOPHAGOGASTRODUODENOSCOPY (EGD);  Surgeon: Irene Shipper, MD;  Location: [place];  Service: Endoscopy;  Laterality: N/A;  . ESOPHAGOGASTRODUODENOSCOPY N/A 04/30/2015   Procedure: ESOPHAGOGASTRODUODENOSCOPY (EGD);  Surgeon: Rogene Houston, MD;  [facility];  Service: Endoscopy;  Laterality: N/A;  1pm - moved to 10/20 @ 1:10  . ESOPHAGOGASTRODUODENOSCOPY N/A 07/29/2016   Procedure: ESOPHAGOGASTRODUODENOSCOPY (EGD);  Surgeon: Manus Gunning, MD;  Location: [place];  Service: Gastroenterology;  Laterality: N/A;  enteroscopy  . ESOPHAGOGASTRODUODENOSCOPY N/A 09/26/2019   Procedure: ESOPHAGOGASTRODUODENOSCOPY (EGD);  Surgeon: Rogene Houston, MD;  [facility];  Service: Endoscopy;  Laterality: N/A;  1250  . GIVENS CAPSULE STUDY N/A 03/07/2019   Procedure: GIVENS CAPSULE STUDY;  Surgeon: Rogene Houston, MD;  [facility];  Service: Endoscopy;  Laterality: N/A;  7:30  . INTRAOPERATIVE TRANSESOPHAGEAL ECHOCARDIOGRAM  06/13/2012   Procedure: INTRAOPERATIVE TRANSESOPHAGEAL ECHOCARDIOGRAM;  Surgeon: Grace Isaac, MD;  Location: [place];  Service: Open Heart Surgery;  Laterality: N/A;  . IR GENERIC HISTORICAL  07/26/2016   IR FLUORO GUIDE CV LINE RIGHT 07/26/2016 Greggory Keen, MD [facility]  . IR GENERIC HISTORICAL  07/26/2016    IR US GUIDE VASC ACCESS RIGHT 07/26/2016 Greggory Keen, MD [facility]  . IR GENERIC HISTORICAL  08/02/2016   IR US GUIDE VASC ACCESS RIGHT 08/02/2016 Greggory Keen, MD [facility]  . IR GENERIC HISTORICAL  08/02/2016   IR FLUORO GUIDE CV LINE RIGHT 08/02/2016 Greggory Keen, MD [facility]  . IR RADIOLOGY PERIPHERAL GUIDED IV START  03/28/2017  . IR US GUIDE VASC ACCESS RIGHT  03/28/2017  . LEFT HEART CATH AND CORONARY ANGIOGRAPHY N/A 09/20/2016   Procedure: Left Heart Cath and Coronary Angiography;  Surgeon: [name], MD;  Location: [place] CV LAB;  Service: Cardiovascular;  Laterality: N/A;  . LEFT HEART CATH AND CORS/GRAFTS ANGIOGRAPHY N/A 10/13/2016   Procedure: Left Heart Cath and Cors/Grafts Angiography;  Surgeon: Troy Sine, MD;  Location: [place] [facility] LAB;  Service: Cardiovascular;  Laterality: N/A;  . LEFT HEART CATH AND CORS/GRAFTS ANGIOGRAPHY N/A 07/13/2018   Procedure: LEFT HEART CATH AND CORS/GRAFTS ANGIOGRAPHY;  Surgeon: Martinique, Peter M, MD;  Location: [place] [facility];  Service: Cardiovascular;  Laterality: N/A;  . LEFT HEART CATHETERIZATION WITH CORONARY ANGIOGRAM N/A 12/15/2011   Procedure: LEFT HEART CATHETERIZATION WITH CORONARY ANGIOGRAM;  Surgeon: Burnell Blanks, MD;  Location: [facility] CATH LAB;  Service: Cardiovascular;  Laterality: N/A;  . LEFT HEART CATHETERIZATION WITH CORONARY ANGIOGRAM N/A 01/10/2012   Procedure: LEFT HEART CATHETERIZATION WITH CORONARY ANGIOGRAM;  Surgeon: Peter M Martinique, MD;  Location: [facility] CATH LAB;  Service: Cardiovascular;  Laterality: N/A;  . LEFT HEART CATHETERIZATION WITH CORONARY ANGIOGRAM N/A 06/08/2012   Procedure: LEFT HEART CATHETERIZATION WITH CORONARY ANGIOGRAM;  Surgeon: Burnell Blanks, MD;  Location: [facility] CATH LAB;  Service: Cardiovascular;  Laterality: N/A;  . LEFT HEART CATHETERIZATION WITH CORONARY/GRAFT ANGIOGRAM N/A 12/10/2013   Procedure: LEFT HEART CATHETERIZATION WITH Beatrix Fetters;  Surgeon: [name], MD;  Location: [facility] CATH LAB;  Service: Cardiovascular;  Laterality: N/A;  . OVARY SURGERY     ovarian cancer  . POLYPECTOMY  03/09/2019   Procedure: POLYPECTOMY;  Surgeon: Rogene Houston, MD;  [facility];  Service: Endoscopy;;  cecal   . POLYPECTOMY N/A 09/26/2019   Procedure: DUODENAL POLYPECTOMY;  Surgeon: Rogene Houston, MD;  [facility];  Service: Endoscopy;  Laterality: N/A;  . REVISION OF ARTERIOVENOUS GORETEX GRAFT N/A 02/24/2017   Procedure: REVISION OF ARTERIOVENOUS GORETEX  GRAFT (RESECTION);  Surgeon: Katha Cabal, MD;  Location: [facility] ORS;  Service: Vascular;  Laterality: N/A;  Earney Mallet N/A 10/15/2013   Procedure: Fistulogram;  Surgeon: Serafina Mitchell, MD;  Location: [facility] CATH LAB;  Service: Cardiovascular;  Laterality: N/A;  . THROMBECTOMY / ARTERIOVENOUS GRAFT REVISION  2011   left upper arm  . TUBAL LIGATION  1980's  . UPPER EXTREMITY ANGIOGRAPHY Bilateral 12/06/2016   Procedure: Upper Extremity Angiography;  Surgeon: Katha Cabal, MD;  Location: [place] [facility] LAB;  Service: Cardiovascular;  Laterality: Bilateral;  . UPPER EXTREMITY INTERVENTION Left 06/06/2017   Procedure: UPPER EXTREMITY INTERVENTION;  Surgeon: Katha Cabal, MD;  Location: [place] [facility] LAB;  Service: Cardiovascular;  Laterality: Left;    Social History: Social History   Tobacco Use  . Smoking status: Never Smoker  . Smokeless tobacco: Never Used  Vaping Use  . Vaping Use: Never used  Substance Use Topics  . Alcohol use: No    Alcohol/week: 0.0 standard drinks  . Drug use: No   Additional social history: Please also refer to relevant sections of EMR.  Family History: Family History  Problem Relation Age of Onset  . Heart disease Mother        Heart Disease before age 12  . Hyperlipidemia Mother   . Hypertension Mother   . Diabetes Mother   . Heart attack Mother   . Heart disease Father        Heart Disease before age 78  .  Hyperlipidemia Father   . Hypertension Father   . Diabetes Father   . Diabetes Sister   . Hypertension Sister   . Diabetes Brother   . Hyperlipidemia Brother   . Heart attack Brother   . Hypertension Sister   . Heart attack Brother   . Colon cancer Child 52  . Other Other        noncontributory for early CAD  . Esophageal cancer Neg Hx   . Liver disease Neg Hx   . Kidney disease Neg Hx   . Colon polyps Neg Hx     Allergies and Medications: Allergies  Allergen Reactions  . Aspirin Other (See Comments)    High Doses Mess up her stomach; "makes my bowels have blood in them". Takes 81 mg EC Aspirin   . Penicillins Other (See Comments)    SYNCOPE? , "makes me real weak when I take it; like I'll pass out"  Has patient had a PCN reaction causing immediate rash, facial/tongue/throat swelling, SOB or lightheadedness with hypotension: Yes Has patient had a PCN reaction causing severe rash involving mucus membranes or skin necrosis: no Has patient had a PCN reaction that required hospitalization no Has patient had a PCN reaction occurring within the last 10 years: no If all of the above  . Amlodipine Swelling  . Bactrim [Sulfamethoxazole-Trimethoprim] Rash  . Contrast Media [Iodinated Diagnostic Agents] Itching  . Iron Itching and Other (See Comments)    "they gave me iron in dialysis; had to give me Benadryl cause I had to have the iron" (05/02/2012)  . Nitrofurantoin Hives  . Tylenol [Acetaminophen] Itching and Other (See Comments)    Makes her feet on fire per pt  . Gabapentin Other (See Comments)    Unknown reaction  . Hydralazine Itching  . Dexilant [Dexlansoprazole] Other (See Comments)    Upset stomach  . Levaquin [Levofloxacin In D5w] Rash  . Morphine And Related Itching and Other (See Comments)  Itching in feet  . Plavix [Clopidogrel Bisulfate] Rash  . Protonix [Pantoprazole Sodium] Rash  . Venofer [Ferric Oxide] Itching and Other (See Comments)    Patient reports  using Benadryl prior to doses as [facility]   No current facility-administered medications on file prior to encounter.   Current Outpatient Medications on File Prior to Encounter  Medication Sig Dispense Refill  . albuterol (VENTOLIN HFA) 108 (90 Base) MCG/ACT inhaler Inhale 2 puffs into the lungs every 6 (six) hours as needed for wheezing or shortness of breath.    . ALPRAZolam (XANAX) 0.5 MG tablet Take 0.5 mg by mouth at bedtime as needed for anxiety.    Marland Kitchen amiodarone (PACERONE) 200 MG tablet Take 1 tablet (200 mg total) by mouth daily. Starting July 2 90 tablet 3  . aspirin EC 81 MG tablet Take 81 mg by mouth daily.     . cyclobenzaprine (FLEXERIL) 5 MG tablet Take 5 mg by mouth 2 (two) times daily.     Marland Kitchen dicyclomine (BENTYL) 10 MG capsule Take 10 mg by mouth 2 (two) times daily.     . fluticasone (FLONASE) 50 MCG/ACT nasal spray Place 1 spray into both nostrils at bedtime as needed for allergies.     Marland Kitchen gabapentin (NEURONTIN) 100 MG capsule Take 100 mg by mouth daily.     . isosorbide mononitrate (IMDUR) 60 MG 24 hr tablet Take 1 tablet (60 mg total) by mouth 2 (two) times daily. 60 tablet 6  . lidocaine-prilocaine (EMLA) cream Apply 1 application topically every Monday, Wednesday, and Friday. Prior to dialysis    . loratadine (CLARITIN) 10 MG tablet Take 1 tablet (10 mg total) by mouth daily as needed for allergies. 30 tablet 0  . multivitamin (RENA-VIT) TABS tablet Take 1 tablet by mouth daily.    . nitroGLYCERIN (NITROSTAT) 0.4 MG SL tablet Place 1 tablet (0.4 mg total) under the tongue every 5 (five) minutes as needed for chest pain. 15 tablet 1  . nystatin-triamcinolone (MYCOLOG II) cream Apply 1 application topically 2 (two) times daily. (Patient taking differently: Apply 1 application topically at bedtime. ) 30 g 0  . omeprazole (PRILOSEC) 40 MG capsule Take 1 capsule (40 mg total) by mouth daily before breakfast. 30 capsule 5  . ondansetron (ZOFRAN-ODT) 4 MG disintegrating tablet  Take 4 mg by mouth every 8 (eight) hours as needed for nausea or vomiting.     . sevelamer carbonate (RENVELA) 800 MG tablet Take 800 mg by mouth 3 (three) times daily with meals.    . simvastatin (ZOCOR) 20 MG tablet TAKE 1 TABLET BY MOUTH ONCE DAILY AT BEDTIME (Patient taking differently: Take 20 mg by mouth at bedtime. ) 90 tablet 2    Objective: BP (!) 122/52   Pulse 66   Temp 98.4 F (36.9 C) (Oral)   Resp 18   Ht 5\' 1"  (1.549 m)   Wt 135 lb (61.2 kg)   SpO2 100%   BMI 25.51 kg/m  Exam: General: Alert, oriented, breathing comfortably with BIPAP Neck: Trachea central, no swelling Cardiovascular: RRR, no murmurs, rubs, gallops Respiratory: Rales present bilateral. Otherwise, good air movement in bilateral lung fields.  Gastrointestinal: Soft, nondistended MSK: Mild LE bilateral edema present.  No redness or swelling at left arm fistula site.  Derm: Warm, dry Neuro: No focal deficits Psych: Mood and affect normal  Labs and Imaging: CBC BMET  Recent Labs  Lab 06/12/20 1505  WBC 5.3  HGB 8.4*  HCT 28.4*  PLT 256   Recent Labs  Lab 06/12/20 1505  NA 141  K 4.9  CL 98  CO2 23  BUN 49*  CREATININE 9.26*  GLUCOSE 80  CALCIUM 9.0     EKG: Sinus rhythm Left bundle branch block [id number]  Armando Reichert, MD 06/13/2020, 12:14 AM PGY-1, [place] Intern pager: [phone], text pages welcome  FPTS Upper-Level Resident Addendum I have independently interviewed and examined the patient. I have discussed the above with the original author and agree with their documentation. My edits for correction/addition/clarification are in - green. Please see also any attending notes.  [place] Service pager: [phone] (text pages welcome through [facility]ION)  Wilber Oliphant, M.D.  PGY-3 06/13/2020 2:55 AM

## 2020-06-13 NOTE — Progress Notes (Addendum)
FMTS Attending Daily Note: Shawna Singh, MD  Team Pager [phone] Pager [phone]  I have seen and examined this patient, reviewed their chart. I have discussed this patient with the resident. I agree with the resident's findings, assessment and care plan.   Status is: Observation  The patient will require care spanning > 2 midnights and should be moved to inpatient because: Ongoing diagnostic testing needed not appropriate for outpatient work up  Dispo:  Patient From: Home  Planned Disposition: To be determined  Expected discharge date: 06/16/20  Medically stable for discharge: No   Family Medicine Teaching Service Daily Progress Note Intern Pager: [phone]  Patient name: Shawna [name]: [id number] Date of birth: [birth year] Age: 79 y.o. Gender: female  Primary Care Provider: [name], MD Consultants: Nephrology Code Status: Full  Pt Overview and Major Events to Date:  Admission on 06/12/2020  Assessment and Plan: Shawna [name] is a 79 y.o. female presenting with Clotted AVF. PMH is significant for ESRD on HD (MWF), HTN, HLD, GERD, CAD, Afib, Anxiety, Anemia, and Chronic GI bleed.  Uncontrolled Hypertension, now improved, due to missed HD in setting of clotted L AV fistula   Pt's is sitting comfortably in the HD unit having HD. She denies any complaints. Pt BP's have been labile in the ED. Overnight Systolic BP's ranged from 903-009 and diastolic ranged from 53 to 112 mmHg.  Pt is sating well between 95 to 100%. She denies any chest pain and SOB now. Nephrology following. Appreciate recommendations and management. Pt was in HD during evaluation. Vascular consulted in the ED, they will see Pt today. --Nephrology consulted.  Appreciate recommendations and management. --Pt had HD session early morning today. -Vascular Surgeon will see Pt today for AVF. Recommend consulting IR.  -RFP and CBC daily -Keep oxygen saturation more than 92% -Renal diet, 1200cc  fluid restriction.  Acute hypoxemic respiratory failure, due to pulmonary edema, due to missed HD. Now resolving and improving with HD.  Denies any SOB now. Breathing comfortably using BIPAP at night as per Nephrology. Satting at 95% on BiPAP. -Continue cardiac monitoring. -BiPAP at night. -Strict I/O  Chest pain, likely due to demand ischemia in setting of uncontrolled HTN from missed HD. Symptoms now resolved. Risk factors include known CAD (CABG in 2013, with stent in 2018 to diagonal branch, which had stent occlusion).  -We'll keep monitoring troponin. We'll repeat it after HD. -Continue home aspirin. -Continue home Imdur. -Low threshold for cardiac consult. -Nitroglycerin sublingual 0.4 mg as needed for chest tightness  ESRD on HD -Patient is having HD now. Her creatinine is 10.26 today increased from 9.26 yesterday and BUN is 57 (49 yesterday). Her K is 5.6. Nephrology following.  Appreciate recommendations and management. -Avoid nephrotoxic medications. -Daily RFP -Continue Renvela.  Essential HTN Overnight Systolic BP's ranged from 233-007 and diastolic ranged from 53 to 112 mmHg.  High BP most likely due to volume overload secondary to missed HD.  Patient is getting HD now.  -Continue to monitor BPs -Clonidine as needed.  HLD   Lipid panel is wnl today.  -Continue home simvastatin.  GERD -Protonix not started due to allergy.  Chronic Diastolic HF  Patient denies shortness of breath now.  Bilateral basilar Rales present.  Patient is getting HD today. -HTN control as above.  Paroxysmal atrial fibrillation, not on AC due to history of GI bleed.  Sinus rhythm on EKG yesterday.  No signs of arrhythmia on cardiac monitor. -Continue cardiac monitoring. -Continue  amiodarone on 200 mg daily.  Anxiety Disorder Patient denies any anxiety.  Patient did not need any Xanax last night. patient takes Xanax 0.5 mg at bedtime as needed for anxiety. Continue home Xanax as  needed.  Anemia of chronic kidney disease, stable  Hemoglobin 8.4 yesterday.  Baseline between 8-9.  -Monitor CBC daily -Aranesp per nephrology with HD  Hx of GI bleed from colonic AVM's Patient denies any blood in stools or black stools today.  No concern for GI bleed at this time.   FEN/GI: Renal diet with fluid restriction of 1200 mL PPx: Heparin  Disposition: Med telemetry observation  Subjective:  Patient is lying down comfortably in her bed breathing comfortably with BiPAP in hemodialysis unit.  Patient denies any chest pain, left arm pain, shortness of breath, headache, blurry vision, dizziness, nausea, abdominal pain.  She denies any complaints.  Objective: Temp:  [98.4 F (36.9 C)] 98.4 F (36.9 C) (12/03 1436) Pulse Rate:  [59-91] 59 (12/04 0330) Resp:  [12-24] 24 (12/04 0330) BP: (122-210)/(52-130) 125/112 (12/04 0330) SpO2:  [93 %-100 %] 95 % (12/04 0330) FiO2 (%):  [30 %] 30 % (12/03 2147) Weight:  [61.2 kg] 61.2 kg (12/03 1436) Physical Exam: General: Not in acute distress, alert and oriented, breathing comfortably with BiPAP.  Cardiovascular:, RRR, no murmurs rubs or gallops Respiratory: Good air movement bilateral lung fields, bilateral Rales present Abdomen: Soft, nondistended, nontender Extremities: Bilateral mild lower extremity edema present.  Laboratory: Recent Labs  Lab 06/12/20 1505  WBC 5.3  HGB 8.4*  HCT 28.4*  PLT 256   Recent Labs  Lab 06/12/20 1505  NA 141  K 4.9  CL 98  CO2 23  BUN 49*  CREATININE 9.26*  CALCIUM 9.0  GLUCOSE 80      Imaging/Diagnostic Tests: DG Chest Port 1 View  Result Date: 06/13/2020 CLINICAL DATA:  Central line placement EXAM: PORTABLE CHEST 1 VIEW COMPARISON:  June 12, 2020 FINDINGS: Heart size is enlarged but unchanged from prior study. The patient is status post prior CABG. There is a new left-sided central venous catheter with tip projecting over the left brachiocephalic vein. There is no  left-sided pneumothorax. There are small to moderate-sized bilateral pleural effusions with adjacent airspace disease, favored to represent atelectasis. Aortic calcifications are noted. IMPRESSION: 1. New left-sided central venous catheter as above with no evidence for pneumothorax. 2. Small to moderate-sized bilateral pleural effusions with adjacent airspace disease, favored to represent atelectasis. Electronically Signed   By: Constance Holster M.D.   On: 06/13/2020 02:00   DG Chest Port 1 View  Result Date: 06/12/2020 CLINICAL DATA:  Evaluate for volume overload. EXAM: PORTABLE CHEST 1 VIEW COMPARISON:  May 13, 2020 FINDINGS: No pneumothorax. Scattered atelectasis in the bases. Mild pulmonary venous congestion without overt edema. No pneumothorax. No other acute abnormalities. IMPRESSION: Cardiomegaly and pulmonary venous congestion without overt edema Bibasilar atelectasis. Electronically Signed   By: Dorise Bullion III M.D   On: 06/12/2020 18:28    Armando Reichert, MD 06/13/2020, 4:27 AM PGY-1, [place] Intern pager: [phone], text pages welcome

## 2020-06-13 NOTE — Plan of Care (Signed)
  Problem: Activity: Goal: Risk for activity intolerance will decrease Outcome: Progressing   Problem: Nutrition: Goal: Adequate nutrition will be maintained Outcome: Progressing  She has ambulated in her room several times today and tolerated the activity well.  She has had no issues with her appetite, eating 100% of her lunch.

## 2020-06-13 NOTE — ED Provider Notes (Signed)
  [facility] EMERGENCY DEPARTMENT Provider Note   CSN: [id number] Arrival date & time: 06/12/20  1424    Procedures .Central Line  Date/Time: 06/13/2020 12:52 AM Performed by: Darrick Huntsman, MD Authorized by: Varney Biles, MD   Consent:    Consent obtained:  Written   Consent given by:  Patient   Risks discussed:  Arterial puncture, bleeding, infection, incorrect placement, nerve damage and pneumothorax   Alternatives discussed:  No treatment Pre-procedure details:    Hand hygiene: Hand hygiene performed prior to insertion     Sterile barrier technique: All elements of maximal sterile technique followed     Skin preparation:  2% chlorhexidine   Skin preparation agent: Skin preparation agent completely dried prior to procedure   Anesthesia (see MAR for exact dosages):    Anesthesia method:  Local infiltration   Local anesthetic:  Lidocaine 1% WITH epi Procedure details:    Location:  L internal jugular   Site selection rationale:  Patient preferred IJ, right IJ not confidently identified on [place], left IJ more appropriate based on [place]   Patient position:  Flat   Procedural supplies:  Triple lumen   Catheter size: Trialysis catheter.   Landmarks identified: yes     Ultrasound guidance: yes     Sterile ultrasound techniques: Sterile gel and sterile probe covers were used     Number of attempts:  1   Successful placement: yes   Post-procedure details:    Post-procedure:  Dressing applied and line sutured   Assessment:  Blood return through all ports, free fluid flow, no pneumothorax on x-ray and placement verified by x-ray   Patient tolerance of procedure:  Tolerated well, no immediate complications     Procedure supervised by Dr. Kathrynn Humble.     Darrick Huntsman, MD 06/13/20 4431    Varney Biles, MD 06/13/20 1545

## 2020-06-13 NOTE — Consult Note (Signed)
Vascular and Vein Specialist of [facility]  Patient name: Shawna [name] MRN: [id number] DOB: [birth year] Sex: female   REQUESTING PROVIDER:    ER   REASON FOR CONSULT:    Clotted left arm dialysis graft  HISTORY OF PRESENT ILLNESS:   Shawna [name] is a 79 y.o. female, who was sent to the ER by the dialysis center because her left arm graft was clotted.  She developed SOB in the ER and was admitted for emergent dialysis.  A temporary catheter was placed by the ER an d she underwent emergent dialysis.     PAST MEDICAL HISTORY    Past Medical History:  Diagnosis Date  . Acute on chronic respiratory failure with hypoxia ([place]) 10/10/2016  . Anxiety   . Arthritis   . AVM (arteriovenous malformation) of colon   . CAD (coronary artery disease)    a. s/p CABG in 2013 b. DES to D1 in 10/2016. c. cath in 07/2018 showing patent grafts with occlusion of D1 at prior stent site and progression of PDA disease --> medical management recommended  . Carotid artery disease ([place])    a. 01-02% LICA, 01/2535   . Chronic anemia   . Chronic bronchitis ([place])   . Chronic diastolic CHF (congestive heart failure) ([place])    a. 02/2012 Echo EF 60-65%, nl wall motion, Gr 1 DD, mod MR  . Colon cancer ([place]) 1992  . Esophageal stricture   . ESRD on hemodialysis ([place])    ESRD due to HTN, started dialysis 2011 and gets HD at [facility] with Dr Hinda Lenis on MWF schedule.  Access is LUA AVF as of Sept 2014.   Marland Kitchen GERD (gastroesophageal reflux disease)   . High cholesterol 12/2011  . History of blood transfusion 07/2011; 12/2011; 01/2012 X 2; 04/2012  . History of gout   . History of lower GI bleeding   . Hypertension   . Iron deficiency anemia   . Mitral regurgitation    a. Moderate by echo, 02/2012  . Myocardial infarction ([place])   . NSVT (nonsustained ventricular tachycardia) ([place])   . Ovarian cancer ([place]) 1992  . PAF (paroxysmal atrial fibrillation) ([place])   . Pneumonia ~ 2009   . PUD (peptic ulcer disease)   . TIA (transient ischemic attack)      FAMILY HISTORY   Family History  Problem Relation Age of Onset  . Heart disease Mother        Heart Disease before age 21  . Hyperlipidemia Mother   . Hypertension Mother   . Diabetes Mother   . Heart attack Mother   . Heart disease Father        Heart Disease before age 33  . Hyperlipidemia Father   . Hypertension Father   . Diabetes Father   . Diabetes Sister   . Hypertension Sister   . Diabetes Brother   . Hyperlipidemia Brother   . Heart attack Brother   . Hypertension Sister   . Heart attack Brother   . Colon cancer Child 48  . Other Other        noncontributory for early CAD  . Esophageal cancer Neg Hx   . Liver disease Neg Hx   . Kidney disease Neg Hx   . Colon polyps Neg Hx     SOCIAL HISTORY:   Social History   Socioeconomic History  . Marital status: Married    Spouse name: [name]  . Number of children: Not on file  .  Years of education: Not on file  . Highest education level: Not on file  Occupational History  . Not on file  Tobacco Use  . Smoking status: Never Smoker  . Smokeless tobacco: Never Used  Vaping Use  . Vaping Use: Never used  Substance and Sexual Activity  . Alcohol use: No    Alcohol/week: 0.0 standard drinks  . Drug use: No  . Sexual activity: Yes    Birth control/protection: Surgical  Other Topics Concern  . Not on file  Social History Narrative   Lives in [place], [place] with husband.  Dialysis pt - mwf.   Social Determinants of Health   Financial Resource Strain:   . Difficulty of Paying Living Expenses: Not on file  Food Insecurity:   . Worried About Charity fundraiser in the Last Year: Not on file  . Ran Out of Food in the Last Year: Not on file  Transportation Needs:   . Lack of Transportation (Medical): Not on file  . Lack of Transportation (Non-Medical): Not on file  Physical Activity:   . Days of Exercise per Week: Not on file  . Minutes  of Exercise per Session: Not on file  Stress:   . Feeling of Stress : Not on file  Social Connections:   . Frequency of Communication with Friends and Family: Not on file  . Frequency of Social Gatherings with Friends and Family: Not on file  . Attends Religious Services: Not on file  . Active Member of Clubs or Organizations: Not on file  . Attends Archivist Meetings: Not on file  . Marital Status: Not on file  Intimate Partner Violence:   . Fear of Current or Ex-Partner: Not on file  . Emotionally Abused: Not on file  . Physically Abused: Not on file  . Sexually Abused: Not on file    ALLERGIES:    Allergies  Allergen Reactions  . Aspirin Other (See Comments)    High Doses Mess up her stomach; "makes my bowels have blood in them". Takes 81 mg EC Aspirin   . Penicillins Other (See Comments)    SYNCOPE? , "makes me real weak when I take it; like I'll pass out"  Has patient had a PCN reaction causing immediate rash, facial/tongue/throat swelling, SOB or lightheadedness with hypotension: Yes Has patient had a PCN reaction causing severe rash involving mucus membranes or skin necrosis: no Has patient had a PCN reaction that required hospitalization no Has patient had a PCN reaction occurring within the last 10 years: no If all of the above  . Amlodipine Swelling  . Bactrim [Sulfamethoxazole-Trimethoprim] Rash  . Contrast Media [Iodinated Diagnostic Agents] Itching  . Iron Itching and Other (See Comments)    "they gave me iron in dialysis; had to give me Benadryl cause I had to have the iron" (05/02/2012)  . Nitrofurantoin Hives  . Tylenol [Acetaminophen] Itching and Other (See Comments)    Makes her feet on fire per pt  . Gabapentin Other (See Comments)    Unknown reaction  . Hydralazine Itching  . Dexilant [Dexlansoprazole] Other (See Comments)    Upset stomach  . Levaquin [Levofloxacin In D5w] Rash  . Morphine And Related Itching and Other (See Comments)     Itching in feet  . Plavix [Clopidogrel Bisulfate] Rash  . Protonix [Pantoprazole Sodium] Rash  . Venofer [Ferric Oxide] Itching and Other (See Comments)    Patient reports using Benadryl prior to doses as [facility]  CURRENT MEDICATIONS:    Current Facility-Administered Medications  Medication Dose Route Frequency Provider Last Rate Last Admin  . ALPRAZolam Duanne Moron) tablet 0.5 mg  0.5 mg Oral QHS PRN Wilber Oliphant, MD      . amiodarone (PACERONE) tablet 200 mg  200 mg Oral Daily Wilber Oliphant, MD      . aspirin EC tablet 81 mg  81 mg Oral Daily Wilber Oliphant, MD      . Chlorhexidine Gluconate Cloth 2 % PADS 6 each  6 each Topical Q0600 Wilber Oliphant, MD      . Darbepoetin Alfa (ARANESP) injection 60 mcg  60 mcg Intravenous Q Fri-HD Wilber Oliphant, MD      . dicyclomine (BENTYL) capsule 10 mg  10 mg Oral BID Wilber Oliphant, MD      . gabapentin (NEURONTIN) capsule 100 mg  100 mg Oral Daily Wilber Oliphant, MD      . heparin injection 5,000 Units  5,000 Units Subcutaneous Q8H Wilber Oliphant, MD   5,000 Units at 06/13/20 0120  . heparin sodium (porcine) 1000 UNIT/ML injection           . isosorbide mononitrate (IMDUR) 24 hr tablet 60 mg  60 mg Oral BID Wilber Oliphant, MD   60 mg at 06/13/20 0120  . nitroGLYCERIN (NITROSTAT) SL tablet 0.4 mg  0.4 mg Sublingual Q5 min PRN Wilber Oliphant, MD   0.4 mg at 06/12/20 1748  . nitroGLYCERIN 50 mg in dextrose 5 % 250 mL (0.2 mg/mL) infusion  0-200 mcg/min Intravenous Titrated Wilber Oliphant, MD   Held at 06/12/20 2309  . sevelamer carbonate (RENVELA) tablet 800 mg  800 mg Oral TID WC Wilber Oliphant, MD      . simvastatin (ZOCOR) tablet 20 mg  20 mg Oral QHS Wilber Oliphant, MD        REVIEW OF SYSTEMS:   [X]  denotes positive finding, [ ]  denotes negative finding Cardiac  Comments:  Chest pain or chest pressure:    Shortness of breath upon exertion:    Short of breath when lying flat:    Irregular heart rhythm:        Vascular    Pain in calf,  thigh, or hip brought on by ambulation:    Pain in feet at night that wakes you up from your sleep:     Blood clot in your veins:    Leg swelling:         Pulmonary    Oxygen at home:    Productive cough:     Wheezing:         Neurologic    Sudden weakness in arms or legs:     Sudden numbness in arms or legs:     Sudden onset of difficulty speaking or slurred speech:    Temporary loss of vision in one eye:     Problems with dizziness:         Gastrointestinal    Blood in stool:      Vomited blood:         Genitourinary    Burning when urinating:     Blood in urine:        Psychiatric    Major depression:         Hematologic    Bleeding problems:    Problems with blood clotting too easily:        Skin    Rashes  or ulcers:        Constitutional    Fever or chills:     PHYSICAL EXAM:   Vitals:   06/13/20 0730 06/13/20 0750 06/13/20 0752 06/13/20 0826  BP: (!) 91/51 (!) 89/50 101/74   Pulse: 70 68 68   Resp: 13 15 17    Temp:   98.4 F (36.9 C)   TempSrc:   Oral   SpO2: 100% 100%    Weight:    62.3 kg  Height:    5\' 1"  (1.549 m)    GENERAL: The patient is a well-nourished female, in no acute distress. The vital signs are documented above. CARDIAC: There is a regular rate and rhythm.  VASCULAR: left arm graft is without a thrill or bruit PULMONARY: Nonlabored respirations MUSCULOSKELETAL: There are no major deformities or cyanosis. NEUROLOGIC: No focal weakness or paresthesias are detected. SKIN: There are no ulcers or rashes noted. PSYCHIATRIC: The patient has a normal affect.  STUDIES:   none  ASSESSMENT and PLAN   The patient has an occluded left upper arm graft.  She needs to have thrombolysis of this next week.  This is usually done by IR on inpatients.  Pleas contact them for a declot.  We will be available if needed.   Leia Alf, MD, FACS [facility] [phone] Pager [phone]

## 2020-06-13 NOTE — Progress Notes (Addendum)
Called down to lab regarding second troponin. Under chart review, status appears as active and collected. Per lab tech, second troponin was never collected. Will order another STAT to be collected before patient goes to dialysis. Will contact ED phlebotomist to ensure stat collection.   Wilber Oliphant, M.D.  3:43 AM 06/13/2020  Spoke to ED RN who will collect prior to HD.  Wilber Oliphant, M.D.  3:47 AM 06/13/2020

## 2020-06-13 NOTE — Progress Notes (Signed)
CPAP/Bipap is PRN order.  Patient has history of CHF and is a Dialysis patient.  Had to have temporary port put in due to de-clot of other port.  No distress noted at this time, will continue to monitor.

## 2020-06-13 NOTE — ED Notes (Signed)
RN called RT to transport pt to hemodialysis (on BiPAP).

## 2020-06-13 NOTE — Progress Notes (Signed)
[facility] Progress Note  Subjective: seen in room, no c/o today  Vitals:   06/13/20 0826 06/13/20 0839 06/13/20 1049 06/13/20 1608  BP:  (!) 138/44 140/66 (!) 152/56  Pulse:  71 80 77  Resp:  19  20  Temp:  (!) 97.5 F (36.4 C)  98.6 F (37 C)  TempSrc:  Oral  Oral  SpO2:  92%  100%  Weight: 62.3 kg     Height: 5\' 1"  (1.549 m)       Exam:   alert, nad   no jvd  Chest cta bilat  Cor reg no RG  Abd soft ntnd no ascites   Ext no LE edema   Alert, NF, ox3   L arm AVF no bruit,     L IJ temp HD cath in place    OP HD: [name]   3.5h  300/600  61kg  2/2.5 bath  LUE AVF  Hep none  - hect 2.5 ug tiw  - epo 8000 IV tiw   Assessment/ Plan: 1.  SOB/ hypertensive urgency- pt was on bipap and improved greatly w/ HD last night, only 1.6 L removed. Pt stable today 2. Chest pain- likely due to HTN and volume overload. Better.  3.  ESRD - usual HD MWF. Had HD last night via temp cath placed by ED.  Next HD Monday.  4.  Hypertension/volume  - HD and UF as above 5.  Anemia  - will dose with aranesp on HD 6.  Metabolic bone disease -  Continue with outpatient meds 7.  Nutrition -  Renal diet 8. Vascular access- clotted LUE AVF, IR being consulted for declot prior to discharge vs TDC placement.     Rob Alandra Sando 06/13/2020, 6:00 PM   Recent Labs  Lab 06/12/20 1505 06/13/20 0352 06/13/20 0956  K 4.9 5.6*  --   BUN 49* 57*  --   CREATININE 9.26* 10.26*  --   CALCIUM 9.0 9.4  --   PHOS  --  8.0*  --   HGB 8.4*  --  9.7*   Inpatient medications: . amiodarone  200 mg Oral Daily  . aspirin EC  81 mg Oral Daily  . Chlorhexidine Gluconate Cloth  6 each Topical Q0600  . darbepoetin (ARANESP) injection - DIALYSIS  60 mcg Intravenous Q Fri-HD  . dicyclomine  10 mg Oral BID  . gabapentin  100 mg Oral Daily  . heparin  5,000 Units Subcutaneous Q8H  . heparin sodium (porcine)      . isosorbide mononitrate  60 mg Oral BID  . sevelamer carbonate  800 mg  Oral TID WC  . simvastatin  20 mg Oral QHS    nitroGLYCERIN

## 2020-06-13 NOTE — Progress Notes (Signed)
Spoke with Dr. Anselm Pancoast with IR.  Advised that patient has access for HD currently, but needs declot for occluded left upper arm graft.  He asked for consult to be placed and states that she will be seen by IR this weekend.  Consult placed  [place] Constable, D.O.  PGY-3 Family Medicine  06/13/2020 11:47 AM

## 2020-06-13 NOTE — ED Notes (Signed)
RN messaged pharmacy for overdue Aranesp. Pharmacy replied, saying that med must be picked up at main pharm window; cannot be tubed. MD made aware of reason for delay.

## 2020-06-13 NOTE — ED Notes (Signed)
RN called hemodialysis for pt report & to bring her for procedure. RN was told to wait 7 minutes for report & for room to be ready

## 2020-06-14 DIAGNOSIS — Z992 Dependence on renal dialysis: Secondary | ICD-10-CM | POA: Diagnosis not present

## 2020-06-14 DIAGNOSIS — I480 Paroxysmal atrial fibrillation: Secondary | ICD-10-CM | POA: Diagnosis present

## 2020-06-14 DIAGNOSIS — I130 Hypertensive heart and chronic kidney disease with heart failure and stage 1 through stage 4 chronic kidney disease, or unspecified chronic kidney disease: Secondary | ICD-10-CM | POA: Diagnosis not present

## 2020-06-14 DIAGNOSIS — I161 Hypertensive emergency: Secondary | ICD-10-CM | POA: Diagnosis present

## 2020-06-14 DIAGNOSIS — I2510 Atherosclerotic heart disease of native coronary artery without angina pectoris: Secondary | ICD-10-CM | POA: Diagnosis not present

## 2020-06-14 DIAGNOSIS — R079 Chest pain, unspecified: Secondary | ICD-10-CM | POA: Diagnosis not present

## 2020-06-14 DIAGNOSIS — N2581 Secondary hyperparathyroidism of renal origin: Secondary | ICD-10-CM | POA: Diagnosis present

## 2020-06-14 DIAGNOSIS — Z951 Presence of aortocoronary bypass graft: Secondary | ICD-10-CM

## 2020-06-14 DIAGNOSIS — F419 Anxiety disorder, unspecified: Secondary | ICD-10-CM | POA: Diagnosis present

## 2020-06-14 DIAGNOSIS — D631 Anemia in chronic kidney disease: Secondary | ICD-10-CM | POA: Diagnosis present

## 2020-06-14 DIAGNOSIS — K5520 Angiodysplasia of colon without hemorrhage: Secondary | ICD-10-CM | POA: Diagnosis present

## 2020-06-14 DIAGNOSIS — I132 Hypertensive heart and chronic kidney disease with heart failure and with stage 5 chronic kidney disease, or end stage renal disease: Secondary | ICD-10-CM | POA: Diagnosis present

## 2020-06-14 DIAGNOSIS — J810 Acute pulmonary edema: Secondary | ICD-10-CM | POA: Diagnosis not present

## 2020-06-14 DIAGNOSIS — I248 Other forms of acute ischemic heart disease: Secondary | ICD-10-CM | POA: Diagnosis present

## 2020-06-14 DIAGNOSIS — T82868A Thrombosis of vascular prosthetic devices, implants and grafts, initial encounter: Secondary | ICD-10-CM

## 2020-06-14 DIAGNOSIS — D539 Nutritional anemia, unspecified: Secondary | ICD-10-CM | POA: Diagnosis present

## 2020-06-14 DIAGNOSIS — I953 Hypotension of hemodialysis: Secondary | ICD-10-CM | POA: Diagnosis not present

## 2020-06-14 DIAGNOSIS — D649 Anemia, unspecified: Secondary | ICD-10-CM

## 2020-06-14 DIAGNOSIS — I5032 Chronic diastolic (congestive) heart failure: Secondary | ICD-10-CM | POA: Diagnosis present

## 2020-06-14 DIAGNOSIS — Z20822 Contact with and (suspected) exposure to covid-19: Secondary | ICD-10-CM | POA: Diagnosis present

## 2020-06-14 DIAGNOSIS — T82818A Embolism of vascular prosthetic devices, implants and grafts, initial encounter: Secondary | ICD-10-CM | POA: Diagnosis present

## 2020-06-14 DIAGNOSIS — I25119 Atherosclerotic heart disease of native coronary artery with unspecified angina pectoris: Secondary | ICD-10-CM | POA: Diagnosis present

## 2020-06-14 DIAGNOSIS — J9601 Acute respiratory failure with hypoxia: Secondary | ICD-10-CM | POA: Diagnosis present

## 2020-06-14 DIAGNOSIS — K219 Gastro-esophageal reflux disease without esophagitis: Secondary | ICD-10-CM | POA: Diagnosis present

## 2020-06-14 DIAGNOSIS — E785 Hyperlipidemia, unspecified: Secondary | ICD-10-CM | POA: Diagnosis present

## 2020-06-14 DIAGNOSIS — Z881 Allergy status to other antibiotic agents status: Secondary | ICD-10-CM | POA: Diagnosis not present

## 2020-06-14 DIAGNOSIS — Y832 Surgical operation with anastomosis, bypass or graft as the cause of abnormal reaction of the patient, or of later complication, without mention of misadventure at the time of the procedure: Secondary | ICD-10-CM | POA: Diagnosis present

## 2020-06-14 DIAGNOSIS — Z888 Allergy status to other drugs, medicaments and biological substances status: Secondary | ICD-10-CM | POA: Diagnosis not present

## 2020-06-14 DIAGNOSIS — I25810 Atherosclerosis of coronary artery bypass graft(s) without angina pectoris: Secondary | ICD-10-CM | POA: Diagnosis present

## 2020-06-14 DIAGNOSIS — Z880 Allergy status to penicillin: Secondary | ICD-10-CM | POA: Diagnosis not present

## 2020-06-14 DIAGNOSIS — I10 Essential (primary) hypertension: Secondary | ICD-10-CM | POA: Diagnosis not present

## 2020-06-14 DIAGNOSIS — Z91041 Radiographic dye allergy status: Secondary | ICD-10-CM | POA: Diagnosis not present

## 2020-06-14 DIAGNOSIS — N186 End stage renal disease: Secondary | ICD-10-CM | POA: Diagnosis present

## 2020-06-14 LAB — HEMOGLOBIN AND HEMATOCRIT, BLOOD
HCT: 23 % — ABNORMAL LOW (ref 36.0–46.0)
HCT: 25.3 % — ABNORMAL LOW (ref 36.0–46.0)
Hemoglobin: 6.9 g/dL — CL (ref 12.0–15.0)
Hemoglobin: 8 g/dL — ABNORMAL LOW (ref 12.0–15.0)

## 2020-06-14 LAB — BASIC METABOLIC PANEL
Anion gap: 17 — ABNORMAL HIGH (ref 5–15)
BUN: 50 mg/dL — ABNORMAL HIGH (ref 8–23)
CO2: 23 mmol/L (ref 22–32)
Calcium: 8.7 mg/dL — ABNORMAL LOW (ref 8.9–10.3)
Chloride: 98 mmol/L (ref 98–111)
Creatinine, Ser: 8.48 mg/dL — ABNORMAL HIGH (ref 0.44–1.00)
GFR, Estimated: 4 mL/min — ABNORMAL LOW (ref >60)
Glucose, Bld: 109 mg/dL — ABNORMAL HIGH (ref 70–99)
Potassium: 4.3 mmol/L (ref 3.5–5.1)
Sodium: 138 mmol/L (ref 135–145)

## 2020-06-14 LAB — CBC
HCT: 21.6 % — ABNORMAL LOW (ref 36.0–46.0)
Hemoglobin: 7.1 g/dL — ABNORMAL LOW (ref 12.0–15.0)
MCH: 36 pg — ABNORMAL HIGH (ref 26.0–34.0)
MCHC: 32.9 g/dL (ref 30.0–36.0)
MCV: 109.6 fL — ABNORMAL HIGH (ref 80.0–100.0)
Platelets: 111 10*3/uL — ABNORMAL LOW (ref 150–400)
RBC: 1.97 MIL/uL — ABNORMAL LOW (ref 3.87–5.11)
RDW: 23.9 % — ABNORMAL HIGH (ref 11.5–15.5)
WBC: 5.4 10*3/uL (ref 4.0–10.5)
nRBC: 0.6 % — ABNORMAL HIGH (ref 0.0–0.2)

## 2020-06-14 LAB — OCCULT BLOOD X 1 CARD TO LAB, STOOL: Fecal Occult Bld: NEGATIVE

## 2020-06-14 LAB — TSH: TSH: 5.792 u[IU]/mL — ABNORMAL HIGH (ref 0.350–4.500)

## 2020-06-14 MED ORDER — DARBEPOETIN ALFA 60 MCG/0.3ML IJ SOSY
60.0000 ug | PREFILLED_SYRINGE | INTRAMUSCULAR | Status: DC
  Administered 2020-06-15: 60 ug via INTRAVENOUS
  Filled 2020-06-14: qty 0.3

## 2020-06-14 MED ORDER — SODIUM CHLORIDE 0.9% FLUSH
10.0000 mL | Freq: Two times a day (BID) | INTRAVENOUS | Status: DC
  Administered 2020-06-14 – 2020-06-20 (×11): 10 mL

## 2020-06-14 MED ORDER — SODIUM CHLORIDE 0.9% IV SOLUTION: Freq: Once | INTRAVENOUS | Status: AC

## 2020-06-14 MED ORDER — SODIUM CHLORIDE 0.9% FLUSH: 10.0000 mL | INTRAVENOUS | Status: DC | PRN

## 2020-06-14 NOTE — Progress Notes (Addendum)
Interim progress note  Brief visit to check on patient.  She reports feeling well.  She is on oxygen via nasal cannula and reports this is because her oxygen level was low.  She, however, denies feeling short of breath.  She does not normally wear oxygen at home.  She is having no abdominal pain.  Denies any blood in her stools.  She tolerated the blood transfusion well.  States that she has required transfusion in the past, most recently a couple months ago, but states that prior doctors have been unable to figure out the cause of her blood loss.  Patient is scheduled for repeat H&H this afternoon following her recent blood transfusion.  Patient is hesitant as she admits to being a very hard stick.  Reassuringly, patient feels better.  She is alert and conversational and in no acute distress.  She is on 3 L nasal cannula.  Her abdomen is soft, nontender in all quadrants and without rebound or guarding.  Discussed with nurse about weaning her off supplemental oxygen.  Upon follow-up with nurse states that her oxygen saturation was 95% on room air.  Nurse to also do FOBT to assess if blood loss is coming from GI source.

## 2020-06-14 NOTE — Progress Notes (Signed)
Referring Physician(s): Schertz,R  Supervising Physician: Daryll Brod  Patient Status:  [facility] - In-pt  Chief Complaint: Clotted dialysis hybrid graft   Subjective: Pt familiar to IR service from prior vascular access procedures.  She has a history of end-stage renal disease, hypertension,CHF,  anemia, hyperlipidemia, GERD, coronary artery disease with prior CABG in 2013 chronic occlusion of D1 stent and PDA with patent grafts, A. fib, prior GI bleed with colonic AVM's, anxiety who was admitted to [facility] on 12/4 with clotted upper extremity dialysis hybrid graft.  Patient has temporary left chest dialysis catheter in place now ;request received from primary team and nephrology for LUE dialysis graft thrombolysis.  On 03/19/2019 the patient had PTA and stent placement of the venous outflow left upper arm AV access secondary to greater than 80% stenosis at the venous outflow to the axillary vein. Patient currently denies fever, headache, chest pain, abdominal pain, nausea, vomiting or visible bleeding. She does have dyspnea with exertion and back pain.   Past Medical History:  Diagnosis Date  . Acute on chronic respiratory failure with hypoxia ([place]) 10/10/2016  . Anxiety   . Arthritis   . AVM (arteriovenous malformation) of colon   . CAD (coronary artery disease)    a. s/p CABG in 2013 b. DES to D1 in 10/2016. c. cath in 07/2018 showing patent grafts with occlusion of D1 at prior stent site and progression of PDA disease --> medical management recommended  . Carotid artery disease ([place])    a. 48-18% LICA, 11/6312   . Chronic anemia   . Chronic bronchitis ([place])   . Chronic diastolic CHF (congestive heart failure) ([place])    a. 02/2012 Echo EF 60-65%, nl wall motion, Gr 1 DD, mod MR  . Colon cancer ([place]) 1992  . Esophageal stricture   . ESRD on hemodialysis ([place])    ESRD due to HTN, started dialysis 2011 and gets HD at [facility] with Dr Hinda Lenis on MWF schedule.  Access is LUA AVF  as of Sept 2014.   Marland Kitchen GERD (gastroesophageal reflux disease)   . High cholesterol 12/2011  . History of blood transfusion 07/2011; 12/2011; 01/2012 X 2; 04/2012  . History of gout   . History of lower GI bleeding   . Hypertension   . Iron deficiency anemia   . Mitral regurgitation    a. Moderate by echo, 02/2012  . Myocardial infarction ([place])   . NSVT (nonsustained ventricular tachycardia) ([place])   . Ovarian cancer ([place]) 1992  . PAF (paroxysmal atrial fibrillation) ([place])   . Pneumonia ~ 2009  . PUD (peptic ulcer disease)   . TIA (transient ischemic attack)    Past Surgical History:  Procedure Laterality Date  . A/V SHUNTOGRAM Left 03/19/2019   Procedure: A/V SHUNTOGRAM;  Surgeon: Katha Cabal, MD;  Location: [place] CV LAB;  Service: Cardiovascular;  Laterality: Left;  . ABDOMINAL HYSTERECTOMY  1992  . APPENDECTOMY  06/1990  . AV FISTULA PLACEMENT  07/2009   left upper arm  . AV FISTULA PLACEMENT Right 09/06/2016   Procedure: RIGHT FOREARM ARTERIOVENOUS (AV) GRAFT;  Surgeon: Elam Dutch, MD;  Location: [facility] OR;  Service: Vascular;  Laterality: Right;  . AV FISTULA PLACEMENT N/A 02/24/2017   Procedure: INSERTION OF ARTERIOVENOUS (AV) GORE-TEX GRAFT ARM (BRACHIAL AXILLARY);  Surgeon: Katha Cabal, MD;  Location: [facility] ORS;  Service: Vascular;  Laterality: N/A;  . [place] Right 09/06/2016   Procedure: REMOVAL OF Right Arm ARTERIOVENOUS GORETEX  GRAFT and Vein Patch angioplasty of brachial artery;  Surgeon: Angelia Mould, MD;  Location: [place];  Service: Vascular;  Laterality: Right;  . BIOPSY  09/26/2019   Procedure: BIOPSY;  Surgeon: Rogene Houston, MD;  [facility];  Service: Endoscopy;;  . COLON RESECTION  1992  . COLON SURGERY    . COLONOSCOPY N/A 03/09/2019   Procedure: COLONOSCOPY;  Surgeon: Rogene Houston, MD;  [facility];  Service: Endoscopy;  Laterality: N/A;  . CORONARY ANGIOPLASTY WITH STENT PLACEMENT  12/15/11   "2"  .  CORONARY ANGIOPLASTY WITH STENT PLACEMENT  y/2013   "1; makes total of 3" (05/02/2012)  . CORONARY ARTERY BYPASS GRAFT  06/13/2012   Procedure: CORONARY ARTERY BYPASS GRAFTING (CABG);  Surgeon: Grace Isaac, MD;  Location: [place];  Service: Open Heart Surgery;  Laterality: N/A;  cabg x four;  using left internal mammary artery, and left leg greater saphenous vein harvested endoscopically  . CORONARY STENT INTERVENTION N/A 10/13/2016   Procedure: Coronary Stent Intervention;  Surgeon: Troy Sine, MD;  Location: [place] CV LAB;  Service: Cardiovascular;  Laterality: N/A;  . DIALYSIS/PERMA CATHETER REMOVAL N/A 04/18/2017   Procedure: DIALYSIS/PERMA CATHETER REMOVAL;  Surgeon: Katha Cabal, MD;  Location: [place] CV LAB;  Service: Cardiovascular;  Laterality: N/A;  . DILATION AND CURETTAGE OF UTERUS    . ESOPHAGOGASTRODUODENOSCOPY  01/20/2012   Procedure: ESOPHAGOGASTRODUODENOSCOPY (EGD);  Surgeon: Ladene Artist, MD,FACG;  Location: [facility] ENDOSCOPY;  Service: Endoscopy;  Laterality: N/A;  . ESOPHAGOGASTRODUODENOSCOPY N/A 03/26/2013   Procedure: ESOPHAGOGASTRODUODENOSCOPY (EGD);  Surgeon: Irene Shipper, MD;  Location: [facility] ENDOSCOPY;  Service: Endoscopy;  Laterality: N/A;  . ESOPHAGOGASTRODUODENOSCOPY N/A 04/30/2015   Procedure: ESOPHAGOGASTRODUODENOSCOPY (EGD);  Surgeon: Rogene Houston, MD;  [facility];  Service: Endoscopy;  Laterality: N/A;  1pm - moved to 10/20 @ 1:10  . ESOPHAGOGASTRODUODENOSCOPY N/A 07/29/2016   Procedure: ESOPHAGOGASTRODUODENOSCOPY (EGD);  Surgeon: Manus Gunning, MD;  Location: [place];  Service: Gastroenterology;  Laterality: N/A;  enteroscopy  . ESOPHAGOGASTRODUODENOSCOPY N/A 09/26/2019   Procedure: ESOPHAGOGASTRODUODENOSCOPY (EGD);  Surgeon: Rogene Houston, MD;  [facility];  Service: Endoscopy;  Laterality: N/A;  1250  . GIVENS CAPSULE STUDY N/A 03/07/2019   Procedure: GIVENS CAPSULE STUDY;  Surgeon: Rogene Houston, MD;   [facility];  Service: Endoscopy;  Laterality: N/A;  7:30  . INTRAOPERATIVE TRANSESOPHAGEAL ECHOCARDIOGRAM  06/13/2012   Procedure: INTRAOPERATIVE TRANSESOPHAGEAL ECHOCARDIOGRAM;  Surgeon: Grace Isaac, MD;  Location: [place];  Service: Open Heart Surgery;  Laterality: N/A;  . IR GENERIC HISTORICAL  07/26/2016   IR FLUORO GUIDE CV LINE RIGHT 07/26/2016 Greggory Keen, MD [facility]INTER[facility]  . IR GENERIC HISTORICAL  07/26/2016   IR US GUIDE VASC ACCESS RIGHT 07/26/2016 Greggory Keen, MD [facility]  . IR GENERIC HISTORICAL  08/02/2016   IR US GUIDE VASC ACCESS RIGHT 08/02/2016 Greggory Keen, MD [facility]  . IR GENERIC HISTORICAL  08/02/2016   IR FLUORO GUIDE CV LINE RIGHT 08/02/2016 Greggory Keen, MD [facility]  . IR RADIOLOGY PERIPHERAL GUIDED IV START  03/28/2017  . IR US GUIDE VASC ACCESS RIGHT  03/28/2017  . LEFT HEART CATH AND CORONARY ANGIOGRAPHY N/A 09/20/2016   Procedure: Left Heart Cath and Coronary Angiography;  Surgeon: [name], MD;  Location: [place] CV LAB;  Service: Cardiovascular;  Laterality: N/A;  . LEFT HEART CATH AND CORS/GRAFTS ANGIOGRAPHY N/A 10/13/2016   Procedure:  Left Heart Cath and Cors/Grafts Angiography;  Surgeon: Troy Sine, MD;  Location: [place] CV LAB;  Service: Cardiovascular;  Laterality: N/A;  . LEFT HEART CATH AND CORS/GRAFTS ANGIOGRAPHY N/A 07/13/2018   Procedure: LEFT HEART CATH AND CORS/GRAFTS ANGIOGRAPHY;  Surgeon: Martinique, Peter M, MD;  Location: [place] CV LAB;  Service: Cardiovascular;  Laterality: N/A;  . LEFT HEART CATHETERIZATION WITH CORONARY ANGIOGRAM N/A 12/15/2011   Procedure: LEFT HEART CATHETERIZATION WITH CORONARY ANGIOGRAM;  Surgeon: Burnell Blanks, MD;  Location: [facility] CATH LAB;  Service: Cardiovascular;  Laterality: N/A;  . LEFT HEART CATHETERIZATION WITH CORONARY ANGIOGRAM N/A 01/10/2012   Procedure: LEFT HEART CATHETERIZATION WITH CORONARY ANGIOGRAM;  Surgeon: Peter M Martinique, MD;  Location: [facility] CATH LAB;  Service:  Cardiovascular;  Laterality: N/A;  . LEFT HEART CATHETERIZATION WITH CORONARY ANGIOGRAM N/A 06/08/2012   Procedure: LEFT HEART CATHETERIZATION WITH CORONARY ANGIOGRAM;  Surgeon: Burnell Blanks, MD;  Location: [facility] CATH LAB;  Service: Cardiovascular;  Laterality: N/A;  . LEFT HEART CATHETERIZATION WITH CORONARY/GRAFT ANGIOGRAM N/A 12/10/2013   Procedure: LEFT HEART CATHETERIZATION WITH Beatrix Fetters;  Surgeon: [name], MD;  Location: [facility] CATH LAB;  Service: Cardiovascular;  Laterality: N/A;  . OVARY SURGERY     ovarian cancer  . POLYPECTOMY  03/09/2019   Procedure: POLYPECTOMY;  Surgeon: Rogene Houston, MD;  [facility];  Service: Endoscopy;;  cecal   . POLYPECTOMY N/A 09/26/2019   Procedure: DUODENAL POLYPECTOMY;  Surgeon: Rogene Houston, MD;  [facility];  Service: Endoscopy;  Laterality: N/A;  . REVISION OF ARTERIOVENOUS GORETEX GRAFT N/A 02/24/2017   Procedure: REVISION OF ARTERIOVENOUS GORETEX GRAFT (RESECTION);  Surgeon: Katha Cabal, MD;  Location: [facility] ORS;  Service: Vascular;  Laterality: N/A;  Ear[place] N/A 10/15/2013   Procedure: Fistulogram;  Surgeon: Serafina Mitchell, MD;  Location: [facility] CATH LAB;  Service: Cardiovascular;  Laterality: N/A;  . THROMBECTOMY / ARTERIOVENOUS GRAFT REVISION  2011   left upper arm  . TUBAL LIGATION  1980's  . UPPER EXTREMITY ANGIOGRAPHY Bilateral 12/06/2016   Procedure: Upper Extremity Angiography;  Surgeon: Katha Cabal, MD;  Location: [place] CV LAB;  Service: Cardiovascular;  Laterality: Bilateral;  . UPPER EXTREMITY INTERVENTION Left 06/06/2017   Procedure: UPPER EXTREMITY INTERVENTION;  Surgeon: Katha Cabal, MD;  Location: [place] CV LAB;  Service: Cardiovascular;  Laterality: Left;      Allergies: Aspirin, Penicillins, Amlodipine, Bactrim [sulfamethoxazole-trimethoprim], Contrast media [iodinated diagnostic agents], Iron, Nitrofurantoin, Tylenol [acetaminophen],  Gabapentin, Hydralazine, Dexilant [dexlansoprazole], Levaquin [levofloxacin in d5w], Morphine and related, Plavix [clopidogrel bisulfate], Protonix [pantoprazole sodium], and Venofer [ferric oxide]  Medications: Prior to Admission medications   Medication Sig Start Date End Date Taking? Authorizing Provider  albuterol (VENTOLIN HFA) 108 (90 Base) MCG/ACT inhaler Inhale 2 puffs into the lungs every 6 (six) hours as needed for wheezing or shortness of breath.   Yes [provider]  ALPRAZolam Duanne Moron) 0.5 MG tablet Take 0.5 mg by mouth at bedtime as needed for anxiety.   Yes [provider]  amiodarone (PACERONE) 200 MG tablet Take 1 tablet (200 mg total) by mouth daily. Starting July 2 12/17/19  Yes Strader, Fransisco Hertz, PA-C  aspirin EC 81 MG tablet Take 81 mg by mouth daily.  09/27/19  Yes Rehman, Mechele Dawley, MD  cyclobenzaprine (FLEXERIL) 5 MG tablet Take 5 mg by mouth 2 (two) times daily.  05/08/20  Yes [provider]  dicyclomine (BENTYL) 10 MG capsule Take  10 mg by mouth 2 (two) times daily.    Yes [provider]  fluticasone (FLONASE) 50 MCG/ACT nasal spray Place 1 spray into both nostrils at bedtime as needed for allergies.    Yes [provider]  gabapentin (NEURONTIN) 100 MG capsule Take 100 mg by mouth daily.  04/16/20  Yes [provider]  isosorbide mononitrate (IMDUR) 60 MG 24 hr tablet Take 1 tablet (60 mg total) by mouth 2 (two) times daily. 10/22/19 02/11/21 Yes Herminio Commons, MD  lidocaine-prilocaine (EMLA) cream Apply 1 application topically every Monday, Wednesday, and Friday. Prior to dialysis   Yes [provider]  loratadine (CLARITIN) 10 MG tablet Take 1 tablet (10 mg total) by mouth daily as needed for allergies. 07/14/18  Yes Hosie Poisson, MD  multivitamin (RENA-VIT) TABS tablet Take 1 tablet by mouth daily.   Yes [provider]  nitroGLYCERIN (NITROSTAT) 0.4 MG SL tablet Place 1 tablet (0.4 mg total)  under the tongue every 5 (five) minutes as needed for chest pain. 05/07/20  Yes Johnson, Clanford L, MD  nystatin-triamcinolone (MYCOLOG II) cream Apply 1 application topically 2 (two) times daily. Patient taking differently: Apply 1 application topically at bedtime.  02/25/20  Yes Rehman, Mechele Dawley, MD  omeprazole (PRILOSEC) 40 MG capsule Take 1 capsule (40 mg total) by mouth daily before breakfast. 03/31/20  Yes Laurine Blazer A, PA-C  ondansetron (ZOFRAN-ODT) 4 MG disintegrating tablet Take 4 mg by mouth every 8 (eight) hours as needed for nausea or vomiting.  03/05/19  Yes [provider]  sevelamer carbonate (RENVELA) 800 MG tablet Take 800 mg by mouth 3 (three) times daily with meals.   Yes [provider]  simvastatin (ZOCOR) 20 MG tablet TAKE 1 TABLET BY MOUTH ONCE DAILY AT BEDTIME Patient taking differently: Take 20 mg by mouth at bedtime.  07/22/19  Yes Herminio Commons, MD     Vital Signs: BP (!) 153/49 (BP Location: Right Wrist)   Pulse 74   Temp 98.5 F (36.9 C) (Oral)   Resp 18   Ht 5\' 1"  (1.549 m)   Wt 137 lb 5.6 oz (62.3 kg)   SpO2 93%   BMI 25.95 kg/m   Physical Exam awake, alert; chest with diminished breath sounds bases.  Left lower neck/upper chest temporary HD catheter in place; heart with regular rate and rhythm.  Abdomen soft, positive bowel sounds, nontender.  No lower extremity edema.  Left upper arm hybrid graft with no thrill/bruit.  Imaging: DG Chest Port 1 View  Result Date: 06/13/2020 CLINICAL DATA:  Central line placement EXAM: PORTABLE CHEST 1 VIEW COMPARISON:  June 12, 2020 FINDINGS: Heart size is enlarged but unchanged from prior study. The patient is status post prior CABG. There is a new left-sided central venous catheter with tip projecting over the left brachiocephalic vein. There is no left-sided pneumothorax. There are small to moderate-sized bilateral pleural effusions with adjacent airspace disease, favored to represent  atelectasis. Aortic calcifications are noted. IMPRESSION: 1. New left-sided central venous catheter as above with no evidence for pneumothorax. 2. Small to moderate-sized bilateral pleural effusions with adjacent airspace disease, favored to represent atelectasis. Electronically Signed   By: Constance Holster M.D.   On: 06/13/2020 02:00   DG Chest Port 1 View  Result Date: 06/12/2020 CLINICAL DATA:  Evaluate for volume overload. EXAM: PORTABLE CHEST 1 VIEW COMPARISON:  May 13, 2020 FINDINGS: No pneumothorax. Scattered atelectasis in the bases. Mild pulmonary venous congestion without overt edema.  No pneumothorax. No other acute abnormalities. IMPRESSION: Cardiomegaly and pulmonary venous congestion without overt edema Bibasilar atelectasis. Electronically Signed   By: Dorise Bullion III M.D   On: 06/12/2020 18:28    Labs:  CBC: Recent Labs    05/07/20 0541 05/07/20 0541 06/12/20 1505 06/13/20 0956 06/14/20 0154 06/14/20 0542  WBC 7.8  --  5.3 9.7 5.4  --   HGB 9.4*   < > 8.4* 9.7* 7.1* 6.9*  HCT 29.7*   < > 28.4* 31.0* 21.6* 23.0*  PLT 263  --  256 206 111*  --    < > = values in this interval not displayed.    COAGS: Recent Labs    06/12/20 1505  INR 1.1    BMP: Recent Labs    10/14/19 1350 10/14/19 1350 11/22/19 0258 11/22/19 0258 12/13/19 1556 12/13/19 1556 02/12/20 1712 05/06/20 1319 05/07/20 0541 06/12/20 1505 06/13/20 0352 06/14/20 0154  NA 141   < > 141   < > 136   < > 136   < > 136 141 142 138  K 4.6   < > 5.5*   < > 3.2*   < > 3.7   < > 3.5 4.9 5.6* 4.3  CL 101   < > 100   < > 98   < > 98   < > 95* 98 100 98  CO2 25   < > 28   < > 27   < > 27   < > 29 23 21* 23  GLUCOSE 122*   < > 100*   < > 99   < > 94   < > 80 80 75 109*  BUN 57*   < > 40*   < > 16   < > 16   < > 27* 49* 57* 50*  CALCIUM 8.6*   < > 8.9   < > 8.0*   < > 7.6*   < > 7.6* 9.0 9.4 8.7*  CREATININE 10.84*   < > 8.08*   < > 4.52*   < > 4.84*   < > 4.79* 9.26* 10.26* 8.48*  GFRNONAA 3*    < > 4*   < > 9*   < > 8*   < > 9* 4* 3* 4*  GFRAA 3*  --  5*  --  10*  --  9*  --   --   --   --   --    < > = values in this interval not displayed.    LIVER FUNCTION TESTS: Recent Labs    10/14/19 1350 12/13/19 1556 05/06/20 1319 06/13/20 0352  BILITOT  --  0.3 0.4  --   AST  --  8* 5*  --   ALT  --  9 8  --   ALKPHOS  --  139* 87  --   PROT  --  6.5 6.4*  --   ALBUMIN 3.3* 3.2* 3.0* 2.8*    Assessment and Plan:  Pt with history of end-stage renal disease, hypertension,CHF,  anemia, hyperlipidemia, GERD, coronary artery disease with prior CABG in 2013 chronic occlusion of D1 stent and PDA with patent grafts, A. fib, prior GI bleed with colonic AVM's, anxiety who was admitted to [facility] on 12/4 with clotted upper extremity dialysis hybrid graft.  Patient has temporary left chest dialysis catheter in place now ;request received from primary team and nephrology for LUE dialysis graft thrombolysis.  On 03/19/2019 the patient had  PTA and stent placement of the venous outflow left upper arm AV access secondary to greater than 80% stenosis at the venous outflow to the axillary vein.  Current labs today include hemoglobin 7.1 down from 9.7-denies any denies any acute visible bleeding; plts 111k, WBC nl; K 4.3; patient has been examined by Dr. Anselm Pancoast this am; details/risks of LUE HD graft thrombolysis with possible angioplasty and/or stent placement discussed with patient, including but not limited to, internal bleeding, infection, inability to open graft and need for possible surgery discussed with patient with her understanding and consent; will need to monitor hemoglobin closely prior to thrombolytic therapy to ensure stability; procedure tentatively scheduled for 12/6   Electronically Signed: D. Rowe Robert, PA-C 06/14/2020, 10:58 AM   I spent a total of 30 minutes at the the patient's bedside AND on the patient's hospital floor or unit, greater than 50% of which was  counseling/coordinating care for left upper arm hemodialysis hybrid graft thrombolysis, possible angioplasty and/or stent placement    Patient ID: [name], female   DOB: [birth year], 79 y.o.   MRN: [id number]

## 2020-06-14 NOTE — Progress Notes (Signed)
Interim Progress Note  Patient seen at bedside around 2115 to reevaluate any symptoms of anemia due to low hemoglobin.  Patient is comfortable at this time with no complaints.  She denies any bloody or dark tarry bowel movements.  Patient reports that if this happens outpatient, she will take a stool sample straight to her gastroenterologist.  She does not feel dizzy and denies any abdominal pain.  Vital signs are stable.  Overall well-appearing. Continue current care  Wilber Oliphant, M.D.  11:24 PM 06/14/2020

## 2020-06-14 NOTE — Hospital Course (Addendum)
Shawna [name] is a 79 y.o. female presenting with Clotted AVF. PMH is significant for ESRD on HD (MWF), HTN, HLD, GERD, CAD s/p CABG in 2013 and chronic occlusion of D1 stent and PDA (75% occlusion on 1/20) with patent grafts, Afib not on anticoagulation due to prior GI bleed, Anxiety, Anemia, and Chronic GI bleed.   Acute Asymptomatic Anemia  Anemia of Chronic Kidney Disease: Pt Hb dropped from 8.4 at admission to 6.9. Denied any bleeding per rectum or black tarry stools.Pt received 1 units pf PRBC's. Post transfusion Hb was 8.0. FOBT negative on 12/4.  H/o GI bleed and angiodysplasia. We followed Hb daily. At discharge the Hb stable at 8.4.  Hypertensive Emergency due to missed HD due to Clotted AVF  Essential HTN BP's initially with systolic of 258'N. temp HD catheter placed in ED and urgent HD on 12/4. Pt was followed by Nephrology and got regular HD. Pt's BP improved. Vascular surgery consulted and completed dialysis graft declot procedure with thrombectomy and angioplasty but it was aborted and unable to maintain graft patency due to persistent occlusion in segment of the mid graft.  Right E J  HD catheter placed and left neck temporary catheter removed.  Pt had Revision of left upper arm AV graft with interposition jump graft using 6 mm Gore limb  on 06/19/20.   Chest tightness  Elevated troponin: resolved Patient complained of chest pain Off and on which was improved with Nitro. EKG unchanged from previous. Troponin's mildly elevated to to 59 and then 155 but then downtrend to 124. (06/13/20). Troponins repeated again on 06/17/20 showed mildly elevated to 127. H/o CAD s/p CABG 2013, DES 2019. We continued her home Aspirin and Imdur.  Cardiology consulted. Recommended starting metoprolol 25mg  BID, but patient had soft Bps, therefore this was held on day of discharge.  On day of discharge her CP had resolved and her vitals were stable, MAP on d/c was 66.  Should f/u with cardiology as outpatient.     ESRD on HD Nephrology consulted. Pt got regular HD during her stay in the Hospital.  High TSH  Pt's TSH high at 5.792. Recommended repeat TSH in 4-6 weeks after discharge.  Recommendations for follow up:  Repeat TSH in 4-6 weeks as outpatient due to TSH 5.79  Patient has anemia, with Hgb 8.4 on discharge.  She should follow up with GI as an outpatient [facility] will need to be used for the next month. Metoprolol 25mg  BID was recommended by cardiology, but was held on discharge due to soft Bps.  Restart as tolerated. Xanax and Flexeril held on discharge.  Would not continue given age.

## 2020-06-14 NOTE — Progress Notes (Signed)
12/5 AM labs resulted: Hemoglobin 7.1, HCT 21.6.  MD notified Will continue to monitor

## 2020-06-14 NOTE — Progress Notes (Signed)
Patient with peripheral IV access that is burning with saline push. Patient requiring blood transfusion. Per nephrology, it is ok to use pig tail access on trialysis catheter for blood administration.

## 2020-06-14 NOTE — Progress Notes (Signed)
Pt in no distress at this time requiring bipap.  RT will continue to monitor. 

## 2020-06-14 NOTE — Progress Notes (Signed)
[facility] Progress Note  Subjective: seen in room, no c/o. SOB better after HD last night. UF 1.6 L  Vitals:   06/14/20 1026 06/14/20 1108 06/14/20 1154 06/14/20 1334  BP: (!) 153/49 (!) 152/52 (!) 145/54 (!) 156/66  Pulse: 74 72 70 71  Resp: 18 18 18 16   Temp: 98.5 F (36.9 C) 98.6 F (37 C) 98.1 F (36.7 C) 98.5 F (36.9 C)  TempSrc: Oral Oral Oral Oral  SpO2: 93% 93% 100% 100%  Weight:      Height:        Exam:   alert, nad   no jvd  Chest cta bilat  Cor reg no RG  Abd soft ntnd no ascites   Ext no LE edema   Alert, NF, ox3   L arm AVF no bruit,     L IJ temp HD cath in place    OP HD: [name]   3.5h  300/600  61kg  2/2.5 bath  LUE AVF  Hep none  - hect 2.5 ug tiw  - epo 8000 IV tiw   Assessment/ Plan: 1.  SOB/ hypertensive urgency/ pulm edema- pt was on bipap and improved greatly w/ HD yest,1.6 L removed. Will cont to lower vol w/ HD tomorrow.  2. Clotted LUE AVF - IR consulted for declot vs TDC placement. 3. Chest pain- better. Prob due to HTN/ volume overload. 4.  ESRD - usual HD MWF. HD temp cath placed by ED and had HD 12/3. Next HD Monday.  5.  Hypertension/volume  - HD and UF as above 6.  Anemia  - will dose with aranesp on HD 7.  Metabolic bone disease -  Continue with OP meds 8.  Nutrition -  Renal diet     Kelly Splinter 06/14/2020, 3:47 PM   Recent Labs  Lab 06/13/20 0352 06/13/20 0956 06/14/20 0154 06/14/20 0542  K 5.6*  --  4.3  --   BUN 57*  --  50*  --   CREATININE 10.26*  --  8.48*  --   CALCIUM 9.4  --  8.7*  --   PHOS 8.0*  --   --   --   HGB  --    < > 7.1* 6.9*   < > = values in this interval not displayed.   Inpatient medications: . amiodarone  200 mg Oral Daily  . Chlorhexidine Gluconate Cloth  6 each Topical Q0600  . [START ON 06/15/2020] darbepoetin (ARANESP) injection - DIALYSIS  60 mcg Intravenous Q Mon-HD  . dicyclomine  10 mg Oral BID  . gabapentin  100 mg Oral Daily  . isosorbide mononitrate   60 mg Oral BID  . sevelamer carbonate  800 mg Oral TID WC  . simvastatin  20 mg Oral QHS  . sodium chloride flush  10-40 mL Intracatheter Q12H    loratadine, nitroGLYCERIN, sodium chloride flush

## 2020-06-15 ENCOUNTER — Inpatient Hospital Stay (HOSPITAL_COMMUNITY): Payer: Medicare HMO

## 2020-06-15 LAB — CBC
HCT: 25.9 % — ABNORMAL LOW (ref 36.0–46.0)
Hemoglobin: 8.1 g/dL — ABNORMAL LOW (ref 12.0–15.0)
MCH: 33.9 pg (ref 26.0–34.0)
MCHC: 31.3 g/dL (ref 30.0–36.0)
MCV: 108.4 fL — ABNORMAL HIGH (ref 80.0–100.0)
Platelets: 157 10*3/uL (ref 150–400)
RBC: 2.39 MIL/uL — ABNORMAL LOW (ref 3.87–5.11)
RDW: 24.1 % — ABNORMAL HIGH (ref 11.5–15.5)
WBC: 5.2 10*3/uL (ref 4.0–10.5)
nRBC: 0 % (ref 0.0–0.2)

## 2020-06-15 LAB — RENAL FUNCTION PANEL
Albumin: 2.4 g/dL — ABNORMAL LOW (ref 3.5–5.0)
Anion gap: 18 — ABNORMAL HIGH (ref 5–15)
BUN: 70 mg/dL — ABNORMAL HIGH (ref 8–23)
CO2: 22 mmol/L (ref 22–32)
Calcium: 8.4 mg/dL — ABNORMAL LOW (ref 8.9–10.3)
Chloride: 100 mmol/L (ref 98–111)
Creatinine, Ser: 11.65 mg/dL — ABNORMAL HIGH (ref 0.44–1.00)
GFR, Estimated: 3 mL/min — ABNORMAL LOW (ref >60)
Glucose, Bld: 90 mg/dL (ref 70–99)
Phosphorus: 8 mg/dL — ABNORMAL HIGH (ref 2.5–4.6)
Potassium: 4.2 mmol/L (ref 3.5–5.1)
Sodium: 140 mmol/L (ref 135–145)

## 2020-06-15 LAB — HEPATITIS B SURFACE ANTIBODY, QUANTITATIVE: Hep B S AB Quant (Post): 10.9 m[IU]/mL (ref >9.9)

## 2020-06-15 LAB — T4, FREE: Free T4: 0.66 ng/dL (ref 0.61–1.12)

## 2020-06-15 MED ORDER — LIDOCAINE-PRILOCAINE 2.5-2.5 % EX CREA
TOPICAL_CREAM | Freq: Once | CUTANEOUS | Status: AC
  Filled 2020-06-15: qty 5

## 2020-06-15 MED ORDER — SEVELAMER CARBONATE 800 MG PO TABS
1600.0000 mg | ORAL_TABLET | Freq: Three times a day (TID) | ORAL | Status: DC
  Administered 2020-06-16 – 2020-06-20 (×8): 1600 mg via ORAL
  Filled 2020-06-15 (×9): qty 2

## 2020-06-15 MED ORDER — DIPHENHYDRAMINE HCL 25 MG PO CAPS
50.0000 mg | ORAL_CAPSULE | Freq: Once | ORAL | Status: AC
  Administered 2020-06-16: 50 mg via ORAL
  Filled 2020-06-15 (×2): qty 2

## 2020-06-15 MED ORDER — DIPHENHYDRAMINE HCL 50 MG/ML IJ SOLN
50.0000 mg | Freq: Once | INTRAMUSCULAR | Status: AC
  Filled 2020-06-15: qty 1

## 2020-06-15 MED ORDER — METHOCARBAMOL 500 MG PO TABS
500.0000 mg | ORAL_TABLET | Freq: Once | ORAL | Status: AC
  Administered 2020-06-15: 500 mg via ORAL
  Filled 2020-06-15: qty 1

## 2020-06-15 MED ORDER — PREDNISONE 50 MG PO TABS
50.0000 mg | ORAL_TABLET | Freq: Four times a day (QID) | ORAL | Status: AC
  Administered 2020-06-15 – 2020-06-16 (×3): 50 mg via ORAL
  Filled 2020-06-15 (×4): qty 1

## 2020-06-15 MED ORDER — DARBEPOETIN ALFA 60 MCG/0.3ML IJ SOSY
PREFILLED_SYRINGE | INTRAMUSCULAR | Status: AC
  Filled 2020-06-15: qty 0.3

## 2020-06-15 MED ORDER — HEPARIN SODIUM (PORCINE) 1000 UNIT/ML IJ SOLN
INTRAMUSCULAR | Status: AC
  Administered 2020-06-15: 1000 [IU]
  Filled 2020-06-15: qty 3

## 2020-06-15 NOTE — TOC Initial Note (Signed)
Transition of Care [facility]) - Initial/Assessment Note    Patient Details  Name: Shawna [name] MRN: [id number] Date of Birth: 08/24/1940  [facility]) CM/SW Contact:    Zenon Mayo, RN Phone Number: 06/15/2020, 2:51 PM  Clinical Narrative:                 Patient is from home with spouse, he takes her to her MD apts, she has a rolling walker at home. Plan is to get fistula unclogged today, then receive HD and poss dc tomorrow.  TOC team will cont to follow.   Expected Discharge Plan: Home/Self Care Barriers to Discharge: Continued Medical Work up   Patient Goals and CMS Choice Patient states their goals for this hospitalization and ongoing recovery are:: be able to do what she wants to do   Choice offered to / list presented to : NA  Expected Discharge Plan and Services Expected Discharge Plan: Home/Self Care   Discharge Planning Services: CM Consult   Living arrangements for the past 2 months: Single Family Home                   DME Agency: NA       HH Arranged: NA          Prior Living Arrangements/Services Living arrangements for the past 2 months: Single Family Home Lives with:: Spouse Patient language and need for interpreter reviewed:: Yes Do you feel safe going back to the place where you live?: Yes      Need for Family Participation in Patient Care: Yes (Comment) Care giver support system in place?: Yes (comment) Current home services: DME (has walker) Criminal Activity/Legal Involvement Pertinent to Current Situation/Hospitalization: No - Comment as needed  Activities of Daily Living Home Assistive Devices/Equipment: Cane (specify quad or straight) (quad cane) ADL Screening (condition at time of admission) Patient's cognitive ability adequate to safely complete daily activities?: Yes Is the patient deaf or have difficulty hearing?: No Does the patient have difficulty seeing, even when wearing glasses/contacts?: No Does the patient  have difficulty concentrating, remembering, or making decisions?: No Patient able to express need for assistance with ADLs?: Yes Does the patient have difficulty dressing or bathing?: No Independently performs ADLs?: Yes (appropriate for developmental age) Does the patient have difficulty walking or climbing stairs?: Yes Weakness of Legs: Both Weakness of Arms/Hands: None  Permission Sought/Granted                  Emotional Assessment   Attitude/Demeanor/Rapport: Engaged Affect (typically observed): Appropriate Orientation: : Oriented to Place, Oriented to Self, Oriented to  Time, Oriented to Situation Alcohol / Substance Use: Not Applicable Psych Involvement: No (comment)  Admission diagnosis:  Acute pulmonary edema ([place]) [J81.0] Hypertension [I10] Hypertensive emergency [I16.1] Acute hypoxemic respiratory failure ([place]) [J96.01] Patient Active Problem List   Diagnosis Date Noted  . Acute hypoxemic respiratory failure ([place]) 06/14/2020  . Hypertension 06/12/2020  . Irritable bowel syndrome 02/25/2020  . Pulmonary edema 10/14/2019  . Adenomatous duodenal polyp 09/10/2019  . History of GI bleed 09/10/2019  . Angina pectoris ([place]) 06/05/2019  . Chest pain 06/03/2019  . Rectal bleeding 05/14/2019  . Small intestinal bacterial overgrowth 05/14/2019  . Iron deficiency anemia 04/02/2019  . GI bleed 03/08/2019  . Gastrointestinal hemorrhage with melena 03/06/2019  . Acute respiratory failure with hypoxia ([place]) 12/25/2018  . Elevated troponin 12/14/2018  . Chest pain at rest 07/13/2018  . Hand steal syndrome ([place]) 08/01/2017  .  Macrocytic anemia 07/14/2017  . Coronary artery disease of bypass graft of native heart with stable angina pectoris ([place]) 06/05/2017  . Mesenteric ischemia ([place])   . Diverticulitis   . SBO (small bowel obstruction) ([place]) 03/22/2017  . Enteritis   . Complication of vascular access for dialysis 03/19/2017  . Preoperative clearance 01/25/2017  .  Symptomatic anemia 10/24/2016  . H/O non-ST elevation myocardial infarction (NSTEMI) 10/24/2016  . Fluid overload 10/10/2016  . Complication from renal dialysis device 10/10/2016  . Non-ST elevation MI (NSTEMI) ([place])   . Encounter for fitting and adjustment of vascular catheter   . Heme positive stool   . Demand ischemia ([place]) 07/27/2016  . Hypertensive emergency 07/08/2016  . Acute on chronic respiratory failure with hypoxia ([place])   . Cardiac arrest ([place])   . Palliative care encounter   . Goals of care, counseling/discussion   . Hypertensive crisis without congestive heart failure 05/09/2016  . Acute pulmonary edema ([place]) 04/06/2016  . Acute respiratory failure ([place]) 04/06/2016  . Hypertensive crisis 01/27/2016  . History of colon cancer 01/27/2016  . History of ovarian cancer 01/27/2016  . Hypertensive urgency 01/27/2016  . Paroxysmal atrial fibrillation ([place]) 10/14/2015  . Coronary angioplasty status 10/14/2015  . Malignant neoplasm of right ovary ([place]) 10/14/2015  . Narrow complex tachycardia ([place]) 09/08/2015  . SVT (supraventricular tachycardia) ([place]) 09/08/2015  . Influenza A 08/30/2015  . Acute on chronic diastolic CHF (congestive heart failure) ([place]) 05/04/2015  . Unstable angina ([place]) 05/03/2015  . DOE (dyspnea on exertion)   . Essential hypertension   . Pain in joint, lower leg 08/14/2014  . Dacryocystitis 05/29/2013  . Chronic diastolic CHF (congestive heart failure) ([place]) 03/22/2013  . GI bleeding 03/21/2013  . Acute blood loss anemia 03/21/2013  . Vaginal odor 03/12/2013  . Vaginal discharge 03/12/2013  . Occlusion and stenosis of carotid artery without mention of cerebral infarction 01/24/2013  . Hx of CABG 07/05/2012  . Carotid artery disease ([place]) 07/05/2012  . Mitral regurgitation 06/12/2012  . Pneumonia 06/09/2012  . Non-STEMI (non-ST elevated myocardial infarction) ([place]) 06/08/2012  . Ischemic chest pain ([place]) 03/01/2012  . AVM (arteriovenous  malformation) of small bowel, acquired 01/20/2012  . GERD (gastroesophageal reflux disease) 01/09/2012  . HLD (hyperlipidemia) 01/05/2012  . Atherosclerotic heart disease of native coronary artery without angina pectoris 12/16/2011  . Essential hypertension, benign 12/16/2011  . ESRD on hemodialysis ([place]) 12/16/2011  . Anxiety disorder 05/04/2011  . Anemia in chronic kidney disease 04/29/2011  . Secondary hyperparathyroidism of renal origin ([place]) 04/29/2011  . End stage renal disease ([place]) 04/29/2011  . Gout 04/29/2011  . Hypertensive chronic kidney disease with stage 5 chronic kidney disease or end stage renal disease ([place]) 04/29/2011   PCP:  [name], MD Pharmacy:   [facility] [address], [place] [address] Phone: [phone] Fax: [phone]  [facility] (ESRD Bundle Only) - [place], [address] [place] [address] Phone: [phone] Fax: [phone]     Social Determinants of Health ([place]) Interventions    Readmission Risk Interventions Readmission Risk Prevention Plan 06/15/2020 06/18/2019 12/27/2018  Transportation Screening Complete Complete Complete  PCP or Specialist Appt within 3-5 Days - - Complete  HRI or [place] - - Complete  Social Work Consult for [place] Planning/Counseling - - Complete  Palliative Care Screening - - Not Applicable  Medication Review (RN Care Manager) Complete Complete Complete  PCP or Specialist appointment within 3-5 days of discharge Complete - -  HRI or Home Care Consult Complete - -  SW Recovery Care/Counseling Consult Complete Complete -  Palliative Care Screening Not Applicable Not Applicable -  [place] Not Applicable Not Applicable -  Some recent data might be hidden

## 2020-06-15 NOTE — Progress Notes (Signed)
Pt does not want cpap at this time

## 2020-06-15 NOTE — Progress Notes (Addendum)
Family Medicine Teaching Service Daily Progress Note Intern Pager: [phone]  Patient name: Shawna [name] Medical record number: [id number] Date of birth: [birth year] Age: 79 y.o. Gender: female  Primary Care Provider: [name], MD Consultants: nephro, Vascular, IR Code Status: Full  Pt Overview and Major Events to Date:  12/4 - Admitted to FPTS, HD  Assessment and Plan: Shawna K Moyeris a 79 y.o.femalepresenting with Clotted AVF. PMH is significant forESRD on HD(MWF), HTN, HLD, GERD, CAD s/p CABG in 2013 and chronic occlusion of D1 stent and PDA (75% occlusion on 1/20) with patent grafts, Afib not on anticoagulation due to prior GI bleed, Anxiety, Anemia, and Chronic GI bleed.  Acute Asymptomatic Anemia  Anemia of Chronic Kidney Disease: Patient denies any bleeding in stool or black tarry stool.  Denies any complaints. Hgb 8.1 this AM, increased from 7.1. yesterday. Baseline 8-9. Pt got 1 units pf PRBC's yesterday. Pts blood group is O positive. FOBT negative on 12/4. Recommended to follow up with GI outpatient. Pt had a history of GI bleed in the past. Had angiodysplasia.  -Aranesp per nephrology with HD. - Q4 vitals -Continue to monitor CBC's. -Consult GI if Hb drops again.   Hypertensive Emergency 2/2 missed HD due to Clotted AVF  Essential HTN BP's initially with systolic of 631'S. S/p temp HD catheter placement and urgent HD on 12/4. BP's still running high overnight systolic between 970-263 and diastolic 78-58 mmHg. Today BP- 110/2mmHg in the morningVascular surgery consulted and recommended thrombolysis by IR.  - continue HD per nephrology - Pt will have LUE HD graft thrombolysis today.  - Pt is NPO today pending procedure. - monitor BP's  - Vascular consulted, appreciate recs, s/o -Keep oxygen saturation more than 92% -Will Recheck after procedure today.    Acute hypoxic respiratory failure, resolved This morning, Pt denies any shortness of breath.  Sitting  comfortably in bed. pt is sating at 94% at Room air.  Speaking in full sentences. Improved with PRN Bipap and HD. Per nephrology note, hx of acute respiratory failure requiring intubation for similar presentation.  -Continue cardiac monitoring -EKG as needed -Strict I/O   Chest tightness  Elevated troponin: resolved Pt denies any chest pain today. H/o CAD s/p CABG 2013, DES 2019. Trops trended down after HD [phone]). Suspect initial symptoms and abnormal trop due to demand ischemia. EKG didn't show any ST changes. She follows with cardiology and last seen August 2021. Home meds includes aspirin 81 mg daily and Imdur 60 mg 2 times daily.  - Hold home aspirin. -Continue home Imdur  -Repeat EKG if recurs -Nitroglycerin sublingual 0.4 mg as needed for chest tightness   ESRD on HD Today creatinine is 11.65 (8.48 yesterday) and BUN is 70 (yesterday 50). Pt is on HD (MWF) at [facility]. S/p urgent HD on 12/4 and next HD will be today on 12/6.Temp HD catheter placed in ED. IR is recommending thrombolysis for clotted AVF. Planned for today. -Nephrology Consulted.  Appreciate recommendations and management. -Avoid nephrotoxic meds -Daily RFP -Continue Renvela.   HLD  Home meds:  simvastatin 20 mg daily at bedtime. Lipid panel 12/4: WNL -Continue home simvastatin -Repeat Lipid Panel.   GERD She denies any gastric left reflux today . Home meds include Prilosec 40 mg. Protonix not started due to allergy -Hold Prilosec   Chronic Diastolic HF  Last echo in June 2020 showed normal LV systolic function of 60 to 65% with mild LV hypertrophy and impaired relaxation left ventricle and low  normal systolic function of right ventricle, left atrium mildly dilated.  -HTN control as above    Atrial fibrillation  TSH Not on anticoagulation due to hx of GIB. Home meds: amiodarone 200 mg daily.  -Continue amiodarone 200 mg daily -Continue cardiac monitoring   Anxiety Disorder Home meds: Xanax 0.5 mg  at bedtime as needed for anxiety. Appears she has not had filled in some time. Given infrequent use, will discontinue and reorder one time dose if patient requests.    Hx of GI bleed from colonic AVM's Pt has history of small bowel ischemic injury and ileo-colonic anastomosis few years ago.  Last colonoscopy on 02/2019 showed Diverticulosis in colon, Patent end-to-side ileo-colonic anastomosis, Two small polyps in the proximal ascending colon and One large polyp in the proximal transverse colon which were removed. FOBT negative on 12/4. This morning Pt denies any bleeding in stool or denies black tarry stool.  -Continue to monitor for bleeding.  FEN/GI: NPO for procedure today. Prophylaxis: Holding Heparin for Procedure. SCD until we start Heparin again.   Disposition: Med telemetry observation Pt is from :Home Planned Disposition- To be determined. Expected Discharge date 06/16/20 Pt is not medically stable for discharge.   Subjective:  Patient denies any bleeding in stool or black tarry stool.  Patient denies shortness of breath, chest pain, abdominal pain, nausea, vomiting, constipation. Pt c/o mild sore throat.  Patient denies any other complaints looking forward to having procedure today.   Objective: Temp:  [98 F (36.7 C)-98.9 F (37.2 C)] 98 F (36.7 C) (12/06 0419) Pulse Rate:  [70-76] 71 (12/06 0419) Resp:  [14-18] 18 (12/06 0419) BP: (138-188)/(48-71) 159/59 (12/06 0419) SpO2:  [93 %-100 %] 94 % (12/06 0419) Weight:  [63 kg] 63 kg (12/06 0419) Physical Exam: General: Lying comfortably in bed, not in acute distress.  Temporary HD cath in left side of neck without erythema, edema, discharge or bleeding. Cardiovascular: RRR, no murmurs rubs or gallops. respiratory: Clear to auscultation bilaterally.  No wheezes, rales or rhonchi Abdomen: Soft, nontender, bowel sounds present. Extremities: Bilateral extremity pulses strong.  No significant lower extremity  edema.  Laboratory: Recent Labs  Lab 06/12/20 1505 06/12/20 1505 06/13/20 0956 06/13/20 0956 06/14/20 0154 06/14/20 0542 06/14/20 1550  WBC 5.3  --  9.7  --  5.4  --   --   HGB 8.4*   < > 9.7*   < > 7.1* 6.9* 8.0*  HCT 28.4*   < > 31.0*   < > 21.6* 23.0* 25.3*  PLT 256  --  206  --  111*  --   --    < > = values in this interval not displayed.   Recent Labs  Lab 06/12/20 1505 06/13/20 0352 06/14/20 0154  NA 141 142 138  K 4.9 5.6* 4.3  CL 98 100 98  CO2 23 21* 23  BUN 49* 57* 50*  CREATININE 9.26* 10.26* 8.48*  CALCIUM 9.0 9.4 8.7*  GLUCOSE 80 75 109*      Imaging/Diagnostic Tests:  DG Chest Port 1 View  Result Date: 06/13/2020 CLINICAL DATA:  Central line placement EXAM: PORTABLE CHEST 1 VIEW COMPARISON:  June 12, 2020 FINDINGS: Heart size is enlarged but unchanged from prior study. The patient is status post prior CABG. There is a new left-sided central venous catheter with tip projecting over the left brachiocephalic vein. There is no left-sided pneumothorax. There are small to moderate-sized bilateral pleural effusions with adjacent airspace disease, favored to represent atelectasis. Aortic  calcifications are noted. IMPRESSION: 1. New left-sided central venous catheter as above with no evidence for pneumothorax. 2. Small to moderate-sized bilateral pleural effusions with adjacent airspace disease, favored to represent atelectasis. Electronically Signed   By: Constance Holster M.D.   On: 06/13/2020 02:00   DG Chest Port 1 View  Result Date: 06/12/2020 CLINICAL DATA:  Evaluate for volume overload. EXAM: PORTABLE CHEST 1 VIEW COMPARISON:  May 13, 2020 FINDINGS: No pneumothorax. Scattered atelectasis in the bases. Mild pulmonary venous congestion without overt edema. No pneumothorax. No other acute abnormalities. IMPRESSION: Cardiomegaly and pulmonary venous congestion without overt edema Bibasilar atelectasis. Electronically Signed   By: Dorise Bullion III M.D    On: 06/12/2020 18:28   Armando Reichert, MD 06/15/2020, 6:17 AM PGY-1, [place] Intern pager: [phone], text pages welcome

## 2020-06-15 NOTE — Progress Notes (Signed)
[facility] NEPHROLOGY PROGRESS NOTE  Assessment/ Plan: Pt is a 79 y.o. yo female ESRD on HD, CAD CABG admitted with clotted AV fistula, shortness of breath and hypertensive urgency.  OP HD: [name]   3.5h  300/600  61kg  2/2.5 bath  LUE AVF  Hep none  - hect 2.5 ug tiw  - epo 8000 IV tiw   #SOB/ hypertensive urgency/ pulm edema: Improved after dialysis.  Currently in room air and BP controlled.  UF during HD.  # Clotted LUE AVF - IR consulted for declot vs TDC placement.  Plan for IR procedure today.  #ESRD- usual HD MWF. HD temp cath placed by ED and had HD 12/3. Next HD Monday.   # Chest pain: better. Prob due to HTN/ volume overload.  #Hypertension/volume: Blood pressure improved after dialysis.  UF as tolerated.  # Anemia: Received blood transfusion.  Continue aranesp on HD.  #Secondary hyperparathyroidism: Phosphorus level elevated probably because of under dialysis.  Increase sevelamer dose.  Subjective: Seen and examined.  Chart reviewed.  Plan for IR procedure today.  Denies nausea vomiting chest pain shortness of breath.  BP is better. Objective Vital signs in last 24 hours: Vitals:   06/14/20 2205 06/15/20 0003 06/15/20 0419 06/15/20 0736  BP: (!) 188/71 (!) 170/68 (!) 159/59 (!) 110/43  Pulse:  76 71 76  Resp:  18 18 19   Temp:  98.9 F (37.2 C) 98 F (36.7 C) 98.1 F (36.7 C)  TempSrc:  Oral Oral Oral  SpO2:  94% 94% 97%  Weight:   63 kg   Height:       Weight change: 0.75 kg  Intake/Output Summary (Last 24 hours) at 06/15/2020 0930 Last data filed at 06/15/2020 0849 Gross per 24 hour  Intake 833.67 ml  Output 0 ml  Net 833.67 ml       Labs: Basic Metabolic Panel: Recent Labs  Lab 06/13/20 0352 06/14/20 0154 06/15/20 0517  NA 142 138 140  K 5.6* 4.3 4.2  CL 100 98 100  CO2 21* 23 22  GLUCOSE 75 109* 90  BUN 57* 50* 70*  CREATININE 10.26* 8.48* 11.65*  CALCIUM 9.4 8.7* 8.4*  PHOS 8.0*  --  8.0*   Liver  Function Tests: Recent Labs  Lab 06/13/20 0352 06/15/20 0517  ALBUMIN 2.8* 2.4*   No results for input(s): LIPASE, AMYLASE in the last 168 hours. No results for input(s): AMMONIA in the last 168 hours. CBC: Recent Labs  Lab 06/12/20 1505 06/12/20 1505 06/13/20 0956 06/13/20 0956 06/14/20 0154 06/14/20 0154 06/14/20 0542 06/14/20 1550 06/15/20 0517  WBC 5.3   < > 9.7  --  5.4  --   --   --  5.2  HGB 8.4*   < > 9.7*   < > 7.1*   < > 6.9* 8.0* 8.1*  HCT 28.4*   < > 31.0*   < > 21.6*   < > 23.0* 25.3* 25.9*  MCV 116.4*  --  112.3*  --  109.6*  --   --   --  108.4*  PLT 256   < > 206  --  111*  --   --   --  157   < > = values in this interval not displayed.   Cardiac Enzymes: No results for input(s): CKTOTAL, CKMB, CKMBINDEX, TROPONINI in the last 168 hours. CBG: Recent Labs  Lab 06/13/20 1123  GLUCAP 138*    Iron Studies: No results for  input(s): IRON, TIBC, TRANSFERRIN, FERRITIN in the last 72 hours. Studies/Results: No results found.  Medications: Infusions:   Scheduled Medications: . amiodarone  200 mg Oral Daily  . Chlorhexidine Gluconate Cloth  6 each Topical Q0600  . darbepoetin (ARANESP) injection - DIALYSIS  60 mcg Intravenous Q Mon-HD  . dicyclomine  10 mg Oral BID  . gabapentin  100 mg Oral Daily  . isosorbide mononitrate  60 mg Oral BID  . sevelamer carbonate  800 mg Oral TID WC  . simvastatin  20 mg Oral QHS  . sodium chloride flush  10-40 mL Intracatheter Q12H    have reviewed scheduled and prn medications.  Physical Exam: General:NAD, comfortable Heart:RRR, s1s2 nl Lungs:clear b/l, no crackle Abdomen:soft, Non-tender, non-distended Extremities:No edema Dialysis Access: Left upper extremity AV fistula has no bruit, L IJ temporary HD catheter in place.  Felix Meras Prasad Jazper Nikolai 06/15/2020,9:30 AM  LOS: 1 day  Pager: [phone]

## 2020-06-15 NOTE — Progress Notes (Signed)
Patient planned for fistulogram with possible intervention today - per chart review patient has contrast allergy which she was not pre-medicated for. Additionally there is an emergent procedure today in IR.  Will plan for fistulogram with possible intervention tomorrow (12/7) at noon - patient will be premedicated for contrast allergy with prednisone 50 mg PO 13 hours, 7 hours and 1 hour prior to procedure (first dose scheduled for 12/6 @ 2300) as well as Benadryl 50 mg PO 1 hour prior to procedure. Patient to be NPO after midnight except for sips with medications.  Please call IR with questions or concerns.  Candiss Norse, PA-C

## 2020-06-15 NOTE — Progress Notes (Signed)
PT Cancellation Note  Patient Details Name: Shawna [name] MRN: [id number] DOB: 05-Jun-1941   Cancelled Treatment:     pt off the floor at a procedure  Lyanne Co, DPT Acute Rehabilitation Services [phone]   [place] 06/15/2020, 12:51 PM

## 2020-06-16 LAB — RENAL FUNCTION PANEL
Albumin: 2.9 g/dL — ABNORMAL LOW (ref 3.5–5.0)
Anion gap: 14 (ref 5–15)
BUN: 40 mg/dL — ABNORMAL HIGH (ref 8–23)
CO2: 23 mmol/L (ref 22–32)
Calcium: 9.5 mg/dL (ref 8.9–10.3)
Chloride: 101 mmol/L (ref 98–111)
Creatinine, Ser: 7.76 mg/dL — ABNORMAL HIGH (ref 0.44–1.00)
GFR, Estimated: 5 mL/min — ABNORMAL LOW (ref >60)
Glucose, Bld: 136 mg/dL — ABNORMAL HIGH (ref 70–99)
Phosphorus: 5.4 mg/dL — ABNORMAL HIGH (ref 2.5–4.6)
Potassium: 4.9 mmol/L (ref 3.5–5.1)
Sodium: 138 mmol/L (ref 135–145)

## 2020-06-16 LAB — CBC
HCT: 31.2 % — ABNORMAL LOW (ref 36.0–46.0)
Hemoglobin: 9.8 g/dL — ABNORMAL LOW (ref 12.0–15.0)
MCH: 34 pg (ref 26.0–34.0)
MCHC: 31.4 g/dL (ref 30.0–36.0)
MCV: 108.3 fL — ABNORMAL HIGH (ref 80.0–100.0)
Platelets: 190 10*3/uL (ref 150–400)
RBC: 2.88 MIL/uL — ABNORMAL LOW (ref 3.87–5.11)
RDW: 23.2 % — ABNORMAL HIGH (ref 11.5–15.5)
WBC: 7.8 10*3/uL (ref 4.0–10.5)
nRBC: 0 % (ref 0.0–0.2)

## 2020-06-16 MED ORDER — DIPHENHYDRAMINE HCL 25 MG PO CAPS: 50.0000 mg | ORAL_CAPSULE | Freq: Once | ORAL | Status: AC

## 2020-06-16 MED ORDER — CHLORHEXIDINE GLUCONATE CLOTH 2 % EX PADS
6.0000 | MEDICATED_PAD | Freq: Every day | CUTANEOUS | Status: DC
  Administered 2020-06-16 – 2020-06-18 (×2): 6 via TOPICAL

## 2020-06-16 MED ORDER — ASPIRIN EC 81 MG PO TBEC
81.0000 mg | DELAYED_RELEASE_TABLET | Freq: Every day | ORAL | Status: DC
  Administered 2020-06-16 – 2020-06-20 (×5): 81 mg via ORAL
  Filled 2020-06-16 (×6): qty 1

## 2020-06-16 MED ORDER — PREDNISONE 50 MG PO TABS
50.0000 mg | ORAL_TABLET | Freq: Four times a day (QID) | ORAL | Status: AC
  Administered 2020-06-16 – 2020-06-17 (×3): 50 mg via ORAL
  Filled 2020-06-16 (×3): qty 1

## 2020-06-16 MED ORDER — DIPHENHYDRAMINE HCL 50 MG/ML IJ SOLN
50.0000 mg | Freq: Once | INTRAMUSCULAR | Status: AC
  Administered 2020-06-17: 50 mg via INTRAVENOUS
  Filled 2020-06-16: qty 1

## 2020-06-16 NOTE — Evaluation (Signed)
Occupational Therapy Evaluation Patient Details Name: Shawna [name] MRN: [id number] DOB: Jan 05, 1941 Today's Date: 06/16/2020    History of Present Illness Pt adm with clotted AV fistula, anemia, acute hypoxic respiratory failure.  PMH - ESRD, HTN, CAD, CABG, afib, anemia, GI bleed.    Clinical Impression   PTA, pt was living with her husband and was independent with ADLs and light IADLs; only using SPC for mobility outside home. Pt currently performing ADLs at [place] level. Pt performing bed mobility and then sit<>stand at EOB with Min Guard and RW. Pt reporting dizziness. Returning pt to sit at EOB and taking BP. Elevated BP 200s/90s. Notified RN and returned pt to supine. Pt would benefit from further acute OT to facilitate safe dc. Pending pt progress, recommend dc to home with HHOT for further OT to optimize safety, independence with ADLs, and return to PLOF.   BP taken with large cuff at R lower arm 211/90 BP taken R calf 191/74 BP taken with small cuff at R lower arm 201/91    Follow Up Recommendations  Home health OT;Supervision/Assistance - 24 hour (Pending progress)    Equipment Recommendations  3 in 1 bedside commode    Recommendations for Other Services PT consult     Precautions / Restrictions Restrictions Weight Bearing Restrictions: No      Mobility Bed Mobility Overal bed mobility: Needs Assistance Bed Mobility: Supine to Sit;Sit to Supine     Supine to sit: Min assist Sit to supine: Min guard   General bed mobility comments: Min A for elevating trunk - pt holding OT's hand and pulling into upright position. Min Guard A for safety    Transfers Overall transfer level: Needs assistance Equipment used: Rolling walker (2 wheeled) Transfers: Sit to/from Stand Sit to Stand: Min guard         General transfer comment: Min Guard A for safety. Pt reporting dizziness. Returned to EOB and taking BP.     Balance Overall balance assessment: Mild  deficits observed, not formally tested                                         ADL either performed or assessed with clinical judgement   ADL Overall ADL's : Needs assistance/impaired Eating/Feeding: Set up;Bed level   Grooming: Set up;Supervision/safety;Bed level   Upper Body Bathing: Set up;Supervision/ safety;Sitting   Lower Body Bathing: Min guard;Sit to/from stand   Upper Body Dressing : Set up;Supervision/safety;Sitting   Lower Body Dressing: Minimal assistance;Sit to/from stand   Toilet Transfer: Designer, television/film set Details (indicate cue type and reason): Min Guard A for safety.          Functional mobility during ADLs: Min guard;Rolling walker General ADL Comments: Pt currently performing ADLs at [place]. Limited by elevated BP.      Vision Baseline Vision/History: Wears glasses Patient Visual Report: No change from baseline       Perception     Praxis      Pertinent Vitals/Pain Pain Assessment: Faces Faces Pain Scale: Hurts little more Pain Location: Dialysis site at L neck Pain Descriptors / Indicators: Grimacing;Discomfort;Guarding Pain Intervention(s): Monitored during session;Limited activity within patient's tolerance;Repositioned     Hand Dominance Right   Extremity/Trunk Assessment Upper Extremity Assessment Upper Extremity Assessment: Overall WFL for tasks assessed   Lower Extremity Assessment Lower Extremity Assessment: Defer to PT evaluation  Cervical / Trunk Assessment Cervical / Trunk Assessment: Kyphotic   Communication Communication Communication: No difficulties   Cognition Arousal/Alertness: Awake/alert Behavior During Therapy: WFL for tasks assessed/performed Overall Cognitive Status: Within Functional Limits for tasks assessed                                     General Comments  BP taken with large cuff at R lower arm 211/90; BP taken R calf 191/74; and BP taken with small  cuff at R lower arm 201/91    Exercises     Shoulder Instructions      Home Living Family/patient expects to be discharged to:: Private residence Living Arrangements: Spouse/significant other Available Help at Discharge: Family;Available 24 hours/day Type of Home: House Home Access: Stairs to enter [employer] of Steps: 1 Entrance Stairs-Rails: Right Home Layout: One level     Bathroom Shower/Tub: Teacher, early years/pre: Standard Bathroom Accessibility: Yes   Home Equipment: Environmental consultant - 4 wheels;Cane - single point;Shower seat          Prior Functioning/Environment Level of Independence: Independent with assistive device(s)        Comments: household ambulator without AD most of time, for longer distances uses SPC PRN        OT Problem List: Decreased strength;Decreased range of motion;Decreased activity tolerance;Impaired balance (sitting and/or standing);Decreased knowledge of use of DME or AE;Decreased knowledge of precautions;Pain      OT Treatment/Interventions: Self-care/ADL training;Therapeutic exercise;Energy conservation;DME and/or AE instruction;Therapeutic activities;Patient/family education    OT Goals(Current goals can be found in the care plan section) Acute Rehab OT Goals Patient Stated Goal: Go home OT Goal Formulation: With patient Time For Goal Achievement: 06/30/20 Potential to Achieve Goals: Good  OT Frequency: Min 2X/week   Barriers to D/C:            Co-evaluation              AM-PAC OT "6 Clicks" Daily Activity     Outcome Measure Help from another person eating meals?: A Little Help from another person taking care of personal grooming?: A Little Help from another person toileting, which includes using toliet, bedpan, or urinal?: A Little Help from another person bathing (including washing, rinsing, drying)?: A Little Help from another person to put on and taking off regular upper body clothing?: A  Little Help from another person to put on and taking off regular lower body clothing?: A Little 6 Click Score: 18   End of Session Equipment Utilized During Treatment: Rolling walker Nurse Communication: Mobility status (elevated BP)  Activity Tolerance: Other (comment) (Limited by BP) Patient left: in bed;with call bell/phone within reach;with nursing/sitter in room;with family/visitor present;with bed alarm set  OT Visit Diagnosis: Other abnormalities of gait and mobility (R26.89);Unsteadiness on feet (R26.81);Muscle weakness (generalized) (M62.81);Pain Pain - Right/Left: Left Pain - part of body:  (Neck)                Time: 0258-5277 OT Time Calculation (min): 18 min Charges:  OT General Charges $OT Visit: 1 Visit OT Evaluation $OT Eval Low Complexity: [place], OTR/L Acute Rehab Pager: [phone] Office: [place] 06/16/2020, 12:28 PM

## 2020-06-16 NOTE — Progress Notes (Signed)
Interim progress note  went down to see patient as patient blood pressure was high at 197/83 mmHg.  Patient is sitting comfortably in her bed, denies any complaints.  Patient denies any blurry vision headache, dizziness, chest pain, shortness of breath.  -We'll keep on monitoring blood pressure. -Keep monitoring any new signs and symptoms.  Dr. Armando Reichert Md PGY1 [facility] Family Medicine

## 2020-06-16 NOTE — Progress Notes (Signed)
PT Cancellation Note  Patient Details Name: Shawna [name] MRN: [id number] DOB: Nov 28, 1940   Cancelled Treatment:    Reason Eval/Treat Not Completed: Medical issues which prohibited therapy. Pt with BP 206/82. Will continue to monitor.    Shary Decamp [facility] 06/16/2020, 11:25 AM

## 2020-06-16 NOTE — Progress Notes (Signed)
FPTS Interim Progress Note  S: Per day team request, visited this patient after nighttime checkup.  Patient was found sitting up in bed watching television, no acute distress.  She reports doing quite well and has no complaints.  She was not wearing SCDs as there was no SCD device in the room.  She is looking forward to her procedure tomorrow.  Advised her that she may eat up until about midnight.  She is concerned about her blood pressure and does not know why could keep rising as it is.  Advised her we will talk to the day team about restarting many of her medications which were held initially due to soft BPs.  O: BP (!) 173/65 (BP Location: Right Arm)   Pulse 88   Temp 98.4 F (36.9 C) (Oral)   Resp 20   Ht 5\' 1"  (1.549 m)   Wt 63.1 kg   SpO2 96%   BMI 26.28 kg/m    General: Awake, alert, oriented, no acute distress Respiratory: Normal respiratory effort, no respiratory distress Extremities: No BLE edema, no overlying skin lesions or abnormalities  A/P: -Asked and available nurse nearby to place SCDs -N.p.o. at midnight order already in -Please page [place] at the number below with any questions or concerns  Ezequiel Essex, MD 06/16/2020, 10:25 PM PGY-1, [place] Medicine Service pager [phone]

## 2020-06-16 NOTE — Progress Notes (Signed)
IR consulted by Dr. Owens Shark for possible image-guided fistulagram with possible intervention.  Patient was scheduled for procedure tentatively for today at 1200 in IR. Unfortunately, there were multiple emergent procedures in IR today so unable to accommodate patient's procedure today. Plan for image-guided fistulagram with possible intervention in IR tentatively for tomorrow 06/17/2020 at 1300 pending IR scheduling. Patient's diet restarted for today, NPO at midnight. Patient will be premedicated for procedure secondary to contrast allergy with prednisone 50 mg PO 13 hours, 7 hours and 1 hour prior to procedure (first dose scheduled for 12/8 @ 0000) as well as Benadryl 50 mg PO 1 hour prior to procedure. Hilaria Ota, RN made aware of above.  Please call IR with questions or concerns.   Bea Graff Ridhi Hoffert, PA-C 06/16/2020, 1:48 PM

## 2020-06-16 NOTE — Progress Notes (Signed)
Family Medicine Teaching Service Daily Progress Note Intern Pager: [phone]  Patient name: Shawna [name] Medical record number: [id number] Date of birth: November 01, 1940 Age: 79 y.o. Gender: female  Primary Care Provider: [name], MD Consultants: nephro, Vascular, IR Code Status: Full  Pt Overview and Major Events to Date:  12/4 - Admitted to FPTS, HD  Assessment and Plan: Mykenna K Moyeris a 79 y.o.femalepresenting with Clotted AVF. PMH is significant forESRD on HD(MWF), HTN, HLD, GERD, CADs/p CABG in 2013 and chronic occlusion of D1 stent and PDA (75% occlusion on 1/20) with patent grafts, Afibnot on anticoagulation due to prior GI bleed, Anxiety, Anemia, and Chronic GI bleed.  Acute Asymptomatic Anemia Anemia of Chronic Kidney Disease: Patient denies any bleeding in stool or black tarry stool.  Denies any complaints. Hgb9.8 this AM, increased from 8.1 yesterday. Baseline 8-9.Pt received 1 units pf PRBC's at this admission. FOBT negative on 12/4.  Pt had a history of GI bleed in the past. Had angiodysplasia.  -Aranesp per nephrology with HD. - Q4 vitals -Continue to monitor CBC's. -Consult GI if Hb drops again.   Hypertensive Emergency2/58missed HDdue to Clotted AVF Essential HTN BP's initially with systolic of 939'Q. S/p temp HD catheter placement and urgent HD on 12/4 and also on 06/15/20. Morning BP is still high at 165/74 mmHg. Vascular surgery consulted and recommended thrombolysis by IR. Pt 's procedure was cancelled as Pt was allergic to [place]. Procedure is planned today in the afternoon with Prednisone and Benadryl prep before the procedure.  - continue HD per nephrology - Pt will have LUE HD graft thrombolysis today afternoon. - Prednisone and Benadryl prep before procedure as ordered by Vascular. - Pt is NPO today pending procedure. - monitor BP's  - Vascular consulted, appreciate recs, s/o -Keep oxygen saturation more than 92% -Will Recheck after procedure  today.   Acute hypoxic respiratory failure,resolved This morning, patient denies any shortness of breath,  breathing comfortably at room air,  pt is sating at 96% at Room air. Improved with PRN Bipap and HD.Per nephrology note, hx of acute respiratory failure requiring intubation for similar presentation.  -Continue cardiac monitoring -EKG as needed -Strict I/O  Chest tightness Elevated troponin: resolved Pt denies any chest pain today. H/o CAD s/p CABG 2013, DES 2019. Trops trended down after HD [phone]). EKG didn't show any ST changes. She follows with cardiology and last seen August 2021.Home meds includesaspirin 81 mg daily and Imdur 60 mg 2 times daily.  - Holdhome aspirin. -Continue home Imdur  -Repeat EKG if recurs -Nitroglycerin sublingual 0.4 mg as needed for chest tightness  ESRD on HD Today creatinine is 7.76 (11.65 yesterday) and BUN is 40 (yesterday 70). Pt is on HD (MWF) Pt had  HD on 12/4 and 12/6.Temp HD catheter in place in the Left side of Neck. IR is recommending thrombolysis for clotted AVF. Planned for today. -Nephrology Consulted. Appreciate recommendations and management. -Avoid nephrotoxic meds -Daily RFP -Continue Renvela.  HLD Home meds:simvastatin 20 mg daily at bedtime. Lipid panel 12/4: WNL -Continue home simvastatin.  GERD She denies any strict reflux. Home medsinclude Prilosec 40 mg. Protonix not started due to allergy.  -Hold Prilosec  Chronic Diastolic HF Last echo in June 2020 showed normalLVsystolic function of 60 to 65% with mild LVhypertrophy and impaired relaxation left ventricle and low normal systolic function of right ventricle, left atrium mildly dilated.  -HTN control as above   Atrial fibrillation  TSH Not on anticoagulation due to hx of GIB.Home  meds:amiodarone 200 mg daily.  -Continue amiodarone 200 mg daily -Continue cardiac monitoring  Anxiety Disorder Home meds:Xanax 0.5 mg at bedtime as needed for  anxiety. Appears she has not had filled in some time. Given infrequent use, will discontinue and reorder one time dose if patient requests.  Hx of GI bleed from colonic AVM's Pt has history of small bowel ischemic injury and ileo-colonic anastomosis few years ago. Last colonoscopy on 02/2019 showed Diverticulosis in colon, Patent end-to-side ileo-colonic anastomosis, Two small polyps in the proximal ascending colon and One large polyp in the proximal transverse colon which were removed. FOBT negative on 12/4. This morning Pt denies any bleeding in stool or denies black tarry stool.  -Continue to monitor for bleeding. -Can start aspirin today.  FEN/GI: NPO for procedure today. Prophylaxis: Holding Heparin for Procedure. SCD until we start Heparin again.  Disposition: Med telemetry observation Pt is from :Home Planned Disposition- To be determined. Expected Discharge date 06/17/20 Pt is not medically stable for discharge.  Subjective:  Patient lying comfortably in her bed.  Complaining of neck pain due to temporary catheter.  Patient denies any shortness of breath, chest pain, nausea, vomiting, constipation, abdominal pain. she denies any complaint She is looking forward to having procedure today. Objective: Temp:  [98.1 F (36.7 C)-98.9 F (37.2 C)] 98.4 F (36.9 C) (12/07 0433) Pulse Rate:  [71-86] 76 (12/07 0433) Resp:  [16-19] 18 (12/07 0433) BP: (110-202)/(43-87) 165/74 (12/07 0433) SpO2:  [94 %-97 %] 96 % (12/07 0433) Weight:  [62 kg-64.1 kg] 63.1 kg (12/07 0433) Physical Exam: General: Lying comfortably in bed, not in acute distress, temporary HD cath in left side of neck without erythema, edema, discharge or bleeding Cardiovascular: RRR, no murmurs, rubs or gallops Respiratory: Clear to auscultation bilaterally. No wheezes, Rales or rhonchi Abdomen: Soft, nontender, bowel sounds present Extremities: Bilateral extremity pulses strong. No significant lower extremity  edema  Laboratory: Recent Labs  Lab 06/14/20 0154 06/14/20 0542 06/14/20 1550 06/15/20 0517 06/16/20 0344  WBC 5.4  --   --  5.2 7.8  HGB 7.1*   < > 8.0* 8.1* 9.8*  HCT 21.6*   < > 25.3* 25.9* 31.2*  PLT 111*  --   --  157 190   < > = values in this interval not displayed.   Recent Labs  Lab 06/14/20 0154 06/15/20 0517 06/16/20 0344  NA 138 140 138  K 4.3 4.2 4.9  CL 98 100 101  CO2 23 22 23   BUN 50* 70* 40*  CREATININE 8.48* 11.65* 7.76*  CALCIUM 8.7* 8.4* 9.5  GLUCOSE 109* 90 136*     Imaging/Diagnostic Tests: No new results.  Armando Reichert, MD 06/16/2020, 7:23 AM PGY-1, [place] Intern pager: [phone], text pages welcome

## 2020-06-16 NOTE — Progress Notes (Signed)
Does not want cpap 

## 2020-06-16 NOTE — Progress Notes (Signed)
**Note DeHillIdentified via Obfuscation** [facility] NEPHROLOGY PROGRESS NOTE  Assessment/ Plan: Pt is a 79 y.o. yo female ESRD on [name], Shawna [name], Shawna of breath and hypertensive urgency.  OP [name]: [name]   3.5h  300/600  61kg  2/2.5 bath  LUE AVF  Hep none  - hect 2.5 ug tiw  - epo 8000 IV tiw   #SOB/ hypertensive urgency/ pulm edema: Improved after dialysis.  Currently in room air and BP controlled.  UF during [name].  # Clotted LUE AVF - IR consulted for declot vs TDC placement.  Plan for IR procedure today.  #ESRD- usual [name] MWF. [name] temp cath placed by ED and had [name] 12/3.  [name] yesterday with around 2.1 L UF, plan for regular [name] tomorrow.  # Chest pain: better. Prob due to HTN/ volume overload.  #Hypertension/volume: Did not get a.m. medication because of n.p.o.  Monitor BP.  UF as tolerated.  # Anemia: Received blood transfusion.  Continue aranesp on [name].  #Secondary hyperparathyroidism: Renvela dose increased, phosphorus 5.4 today.  Subjective: Seen and examined.  Chart reviewed.  Plan for IR procedure today.  Denies nausea vomiting chest pain Shawna of breath.  Her husband at bedside. Objective Vital signs in last 24 hours: Vitals:   06/15/20 1948 06/15/20 2321 06/15/20 2322 06/16/20 0433  BP: (!) 170/51 (!) 202/74 (!) 180/66 (!) 165/74  Pulse: 80 78  76  Resp: 18 16  18   Temp: 98.2 F (36.8 C) 98.9 F (37.2 C)  98.4 F (36.9 C)  TempSrc: Oral Oral  Oral  SpO2: 97% 97%  96%  Weight:    63.1 kg  Height:       Weight change: 1.05 kg  Intake/Output Summary (Last 24 hours) at 06/16/2020 0957 Last data filed at 06/16/2020 0000 Gross per 24 hour  Intake 250 ml  Output 2100 ml  Net -1850 ml       Labs: Basic Metabolic Panel: Recent Labs  Lab 06/13/20 0352 06/13/20 0352 06/14/20 0154 06/15/20 0517 06/16/20 0344  NA 142   < > 138 140 138  K 5.6*   < > 4.3 4.2 4.9  CL 100   < > 98 100 101  CO2 21*   < > 23 22 23   GLUCOSE 75   < > 109* 90  136*  BUN 57*   < > 50* 70* 40*  CREATININE 10.26*   < > 8.48* 11.65* 7.76*  CALCIUM 9.4   < > 8.7* 8.4* 9.5  PHOS 8.0*  --   --  8.0* 5.4*   < > = values in this interval not displayed.   Liver Function Tests: Recent Labs  Lab 06/13/20 0352 06/15/20 0517 06/16/20 0344  ALBUMIN 2.8* 2.4* 2.9*   No results for input(s): LIPASE, AMYLASE in the last 168 hours. No results for input(s): AMMONIA in the last 168 hours. CBC: Recent Labs  Lab 06/12/20 1505 06/12/20 1505 06/13/20 0956 06/13/20 0956 06/14/20 0154 06/14/20 0542 06/14/20 1550 06/15/20 0517 06/16/20 0344  WBC 5.3   < > 9.7   < > 5.4  --   --  5.2 7.8  HGB 8.4*   < > 9.7*   < > 7.1*   < > 8.0* 8.1* 9.8*  HCT 28.4*   < > 31.0*   < > 21.6*   < > 25.3* 25.9* 31.2*  MCV 116.4*  --  112.3*  --  109.6*  --   --  108.4*  108.3*  PLT 256   < > 206   < > 111*  --   --  157 190   < > = values in this interval not displayed.   Cardiac Enzymes: No results for input(s): CKTOTAL, CKMB, CKMBINDEX, TROPONINI in the last 168 hours. CBG: Recent Labs  Lab 06/13/20 1123  GLUCAP 138*    Iron Studies: No results for input(s): IRON, TIBC, TRANSFERRIN, FERRITIN in the last 72 hours. Studies/Results: No results found.  Medications: Infusions:   Scheduled Medications: . amiodarone  200 mg Oral Daily  . Chlorhexidine Gluconate Cloth  6 each Topical Q0600  . darbepoetin (ARANESP) injection - DIALYSIS  60 mcg Intravenous Q MonHillHill  . dicyclomine  10 mg Oral BID  . diphenhydrAMINE  50 mg Oral Once   Or  . diphenhydrAMINE  50 mg Intravenous Once  . gabapentin  100 mg Oral Daily  . isosorbide mononitrate  60 mg Oral BID  . predniSONE  50 mg Oral Q6H  . sevelamer carbonate  1,600 mg Oral TID WC  . simvastatin  20 mg Oral QHS  . sodium chloride flush  10[name]40 mL Intracatheter Q12H    have reviewed scheduled and prn medications.  Physical Exam: General:NAD, comfortable Heart:RRR, s1s2 nl Lungs: Clear b/l, no crackle Abdomen:soft,  NonHilltender, nonHilldistended Extremities:No edema Dialysis Access: Left upper extremity AV [name] has no bruit, L IJ temporary [name] catheter in place.  Marialuisa Basara Prasad Zaxton Angerer 06/16/2020,9:57 AM  LOS: 2 days  Pager: [phone]

## 2020-06-16 NOTE — Progress Notes (Signed)
Received report from [place], RN and assumed care for this pt at this time.

## 2020-06-17 ENCOUNTER — Inpatient Hospital Stay (HOSPITAL_COMMUNITY): Payer: Medicare HMO

## 2020-06-17 HISTORY — PX: IR THROMBECTOMY AV FISTULA W/THROMBOLYSIS INC/SHUNT/IMG LEFT: IMG6105

## 2020-06-17 HISTORY — PX: IR US GUIDE VASC ACCESS RIGHT: IMG2390

## 2020-06-17 HISTORY — PX: IR US GUIDE VASC ACCESS LEFT: IMG2389

## 2020-06-17 HISTORY — PX: IR FLUORO GUIDE CV LINE RIGHT: IMG2283

## 2020-06-17 LAB — RENAL FUNCTION PANEL
Albumin: 2.8 g/dL — ABNORMAL LOW (ref 3.5–5.0)
Anion gap: 18 — ABNORMAL HIGH (ref 5–15)
BUN: 72 mg/dL — ABNORMAL HIGH (ref 8–23)
CO2: 21 mmol/L — ABNORMAL LOW (ref 22–32)
Calcium: 9.3 mg/dL (ref 8.9–10.3)
Chloride: 99 mmol/L (ref 98–111)
Creatinine, Ser: 9.87 mg/dL — ABNORMAL HIGH (ref 0.44–1.00)
GFR, Estimated: 4 mL/min — ABNORMAL LOW (ref >60)
Glucose, Bld: 143 mg/dL — ABNORMAL HIGH (ref 70–99)
Phosphorus: 6.3 mg/dL — ABNORMAL HIGH (ref 2.5–4.6)
Potassium: 4.5 mmol/L (ref 3.5–5.1)
Sodium: 138 mmol/L (ref 135–145)

## 2020-06-17 LAB — HEMOGLOBIN AND HEMATOCRIT, BLOOD
HCT: 33.6 % — ABNORMAL LOW (ref 36.0–46.0)
Hemoglobin: 10.5 g/dL — ABNORMAL LOW (ref 12.0–15.0)

## 2020-06-17 LAB — TROPONIN I (HIGH SENSITIVITY): Troponin I (High Sensitivity): 127 ng/L (ref <18)

## 2020-06-17 IMAGING — XA IR US GUIDE VASC ACCESS LEFT
1 series · 1 of 1 positions shown · non-contrast
Comparison: none

CLINICAL DATA: Occluded left upper arm brachial to axillary
dialysis graft.

[Series 1: fl (-) angio · 1 of 1 slices shown]
[im 1/1]
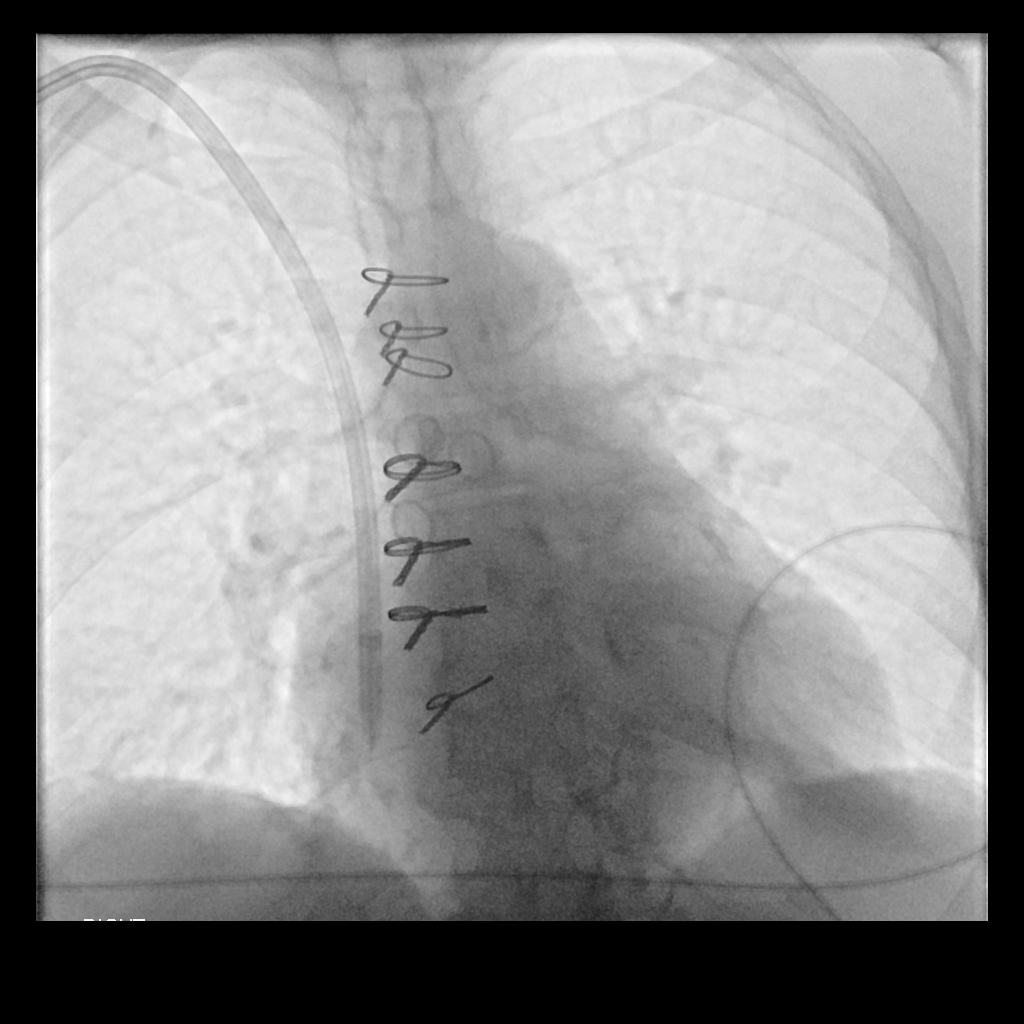

[1 of 1 positions shown; findings below may reference images not displayed]

EXAM:
1. ULTRASOUND GUIDANCE FOR VASCULAR ACCESS OF LEFT UPPER ARM
DIALYSIS GRAFT.
2. ABORTED AND PARTIALLY COMPLETED DIALYSIS FISTULA DECLOT PROCEDURE
WITH TWO SEPARATE GRAFT ACCESS SITES.
3. VENOUS ANGIOPLASTY OF DIALYSIS GRAFT AND VENOUS OUTFLOW.

ANESTHESIA/SEDATION:
Moderate (conscious) sedation was employed during this procedure. A
total of Versed 2.0 mg and Fentanyl 125 mcg was administered
intravenously.

Moderate Sedation Time: 108 minutes. The patient's level of
consciousness and vital signs were monitored continuously by
radiology nursing throughout the procedure under my direct
supervision.

CONTRAST:  75mL OMNIPAQUE IOHEXOL 300 MG/ML  SOLN

MEDICATIONS:
2 mg tPA, [1A] U IV heparin

FLUOROSCOPY TIME:  9 minutes and 18 seconds.  15.5 mGy.

PROCEDURE:
The procedure, risks, benefits, and alternatives were explained to
the patient. Questions regarding the procedure were encouraged and
answered. The patient understands and consents to the procedure. A
time-out was performed prior to initiating the procedure.

Ultrasound interrogation of the left arm dialysis graft was
performed. The left upper arm dialysis graft was prepped with
chlorhexidine in a sterile fashion, and a sterile drape was applied
covering the operative field. A sterile gown and sterile gloves were
used for the procedure. Local anesthesia was provided with 1%
Lidocaine.

Preliminary ultrasound was performed of the dialysis fistula.
Ultrasound-guided antegrade graft access was performed with a
micropuncture set. Ultrasound image documentation was performed. 1
mg of t-PA was instilled. A 6 French sheath was placed over a
guidewire. A diagnostic catheter was advanced and contrast injection
performed at the level of patent venous outflow. Outflow venography
was also performed via the catheter with visualization of central
venous outflow.

Angiojet thrombectomy was performed throughout the left upper arm
graft and at the level of a venous outflow stent. Balloon
angioplasty was performed at the level of the venous outflow stent
and within the dialysis graft with a 7 mm x 4 cm Conquest balloon.

Retrograde access of the dialysis graft was then performed with a
micropuncture set under ultrasound guidance. A 6 French sheath was
placed. Thrombectomy across the arterial anastomosis and within the
proximal graft was then performed with a 4-French Fogarty balloon
catheter. Several passes were made with the Fogarty catheter.
Suction thrombectomy was then performed through both sheaths.

Graft patency was reassessed with angiography. This was followed by
additional Angiojet thrombectomy in both antegrade and retrograde
directions and additional balloon angioplasty with the 7 mm balloon.
Ultimately, the procedure was aborted and both sheaths removed with
application Ethilon pursestring sutures.

COMPLICATIONS:
None
FINDINGS: Ultrasound confirms thrombosis of the graft. The arterial
anastomosis appeared patent by ultrasound. Just beyond the arterial
anastomosis, there is dilatation of the proximal graft followed by
thrombus causing occlusion of the graft.

After graft access, there also is visualization of an elongated
stent which extends from the distal graft, across the venous
anastomosis and into the outflow axillary vein. The entire stent is
occluded and thrombus extends into the axillary vein beyond the
stent.

After initial mechanical thrombectomy and Fogarty thrombectomy, the
proximal aspect of the dialysis graft demonstrated normal patency
including the arterial anastomosis and approximately half of the
dialysis graft. The lumen of the indwelling venous outflow stent was
also able to be dilated. However, there was an area of resistant
stenosis within the midportion of the graft that could be dilated
but demonstrated persistent collapse and recurrent occlusion
suggestive of focally deteriorated graft material. It was not felt
that a covered stent could be placed in this region as this is
currently a segment that the graft would need to be accessed for
dialysis access. At this point during the procedure, it was decided
to abort further attempt at percutaneous intervention and proceed
with placement of a tunneled dialysis catheter for immediate
dialysis needs. The patient will need eventual surgical revision of
the dialysis graft versus new access.
IMPRESSION: Lack of ability to maintain patency of an occluded left upper arm
brachial to axillary vein dialysis graft primarily due to what
appears to be a deteriorated and resistant segment within the
midportion of the graft. An indwelling stent is present from the
region of the distal graft into the outflow axillary vein.

ACCESS:
Due to inability to maintain patency of the occluded dialysis graft,
a tunneled dialysis catheter was placed following the procedure.
This procedure is dictated separately. It is not felt that repeat
attempt at percutaneous declot would be fruitful. Recommend vascular
surgical consultation for consideration of surgical revision of the
current access versus placement of new access.

## 2020-06-17 IMAGING — XA IR FLUORO GUIDE CV LINE*R*
2 series · 4 of 4 positions shown · non-contrast
Comparison: none

CLINICAL DATA: Failed attempt at percutaneous declot of an occluded
left upper arm dialysis graft. Current left IJ temporary non
tunneled dialysis catheter in place.

[Series 1: ir fluoro guide cv line*right* · 2 of 2 slices shown]
[im 1/2]
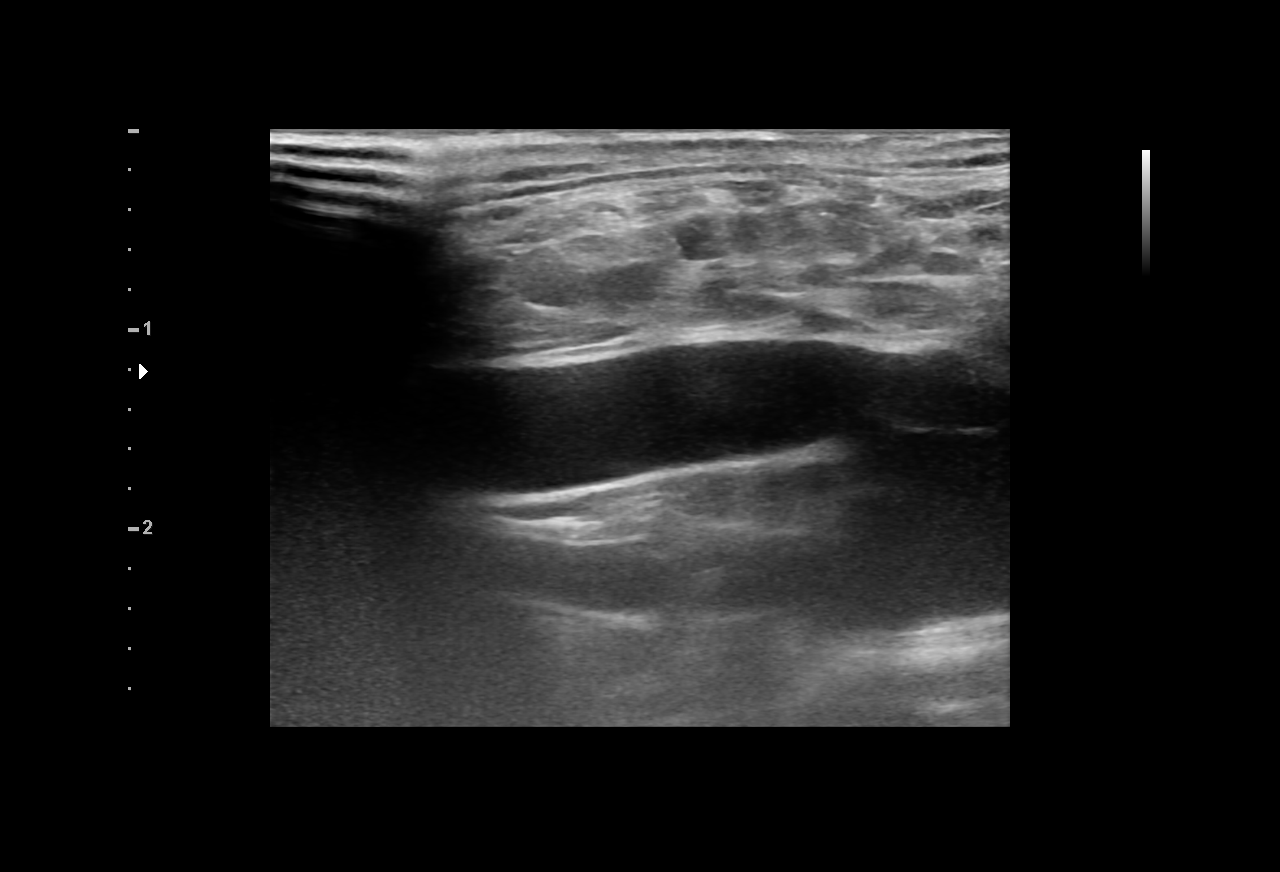
[im 2/2]
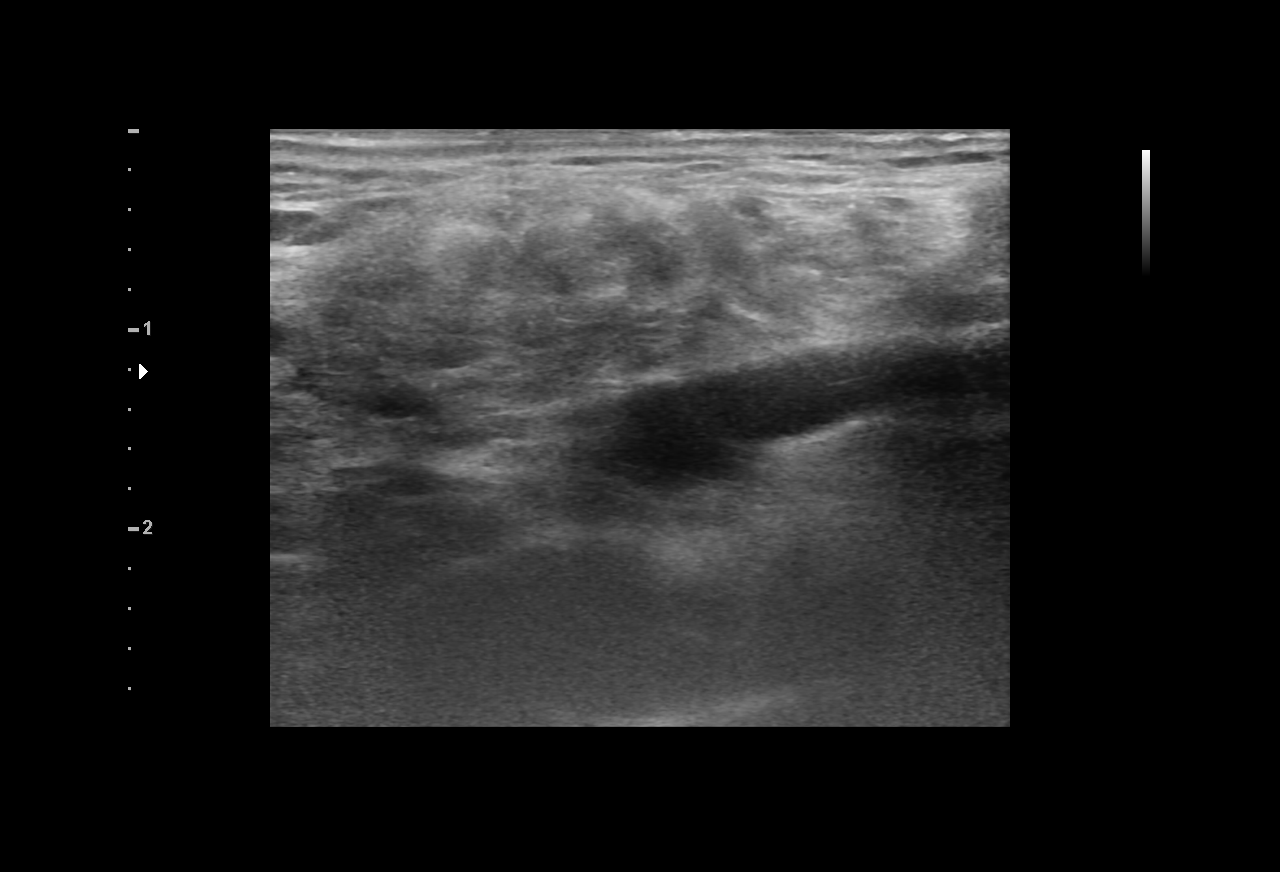

[Series 1: fl (-) angio · 2 of 2 slices shown]
[im 1/2]
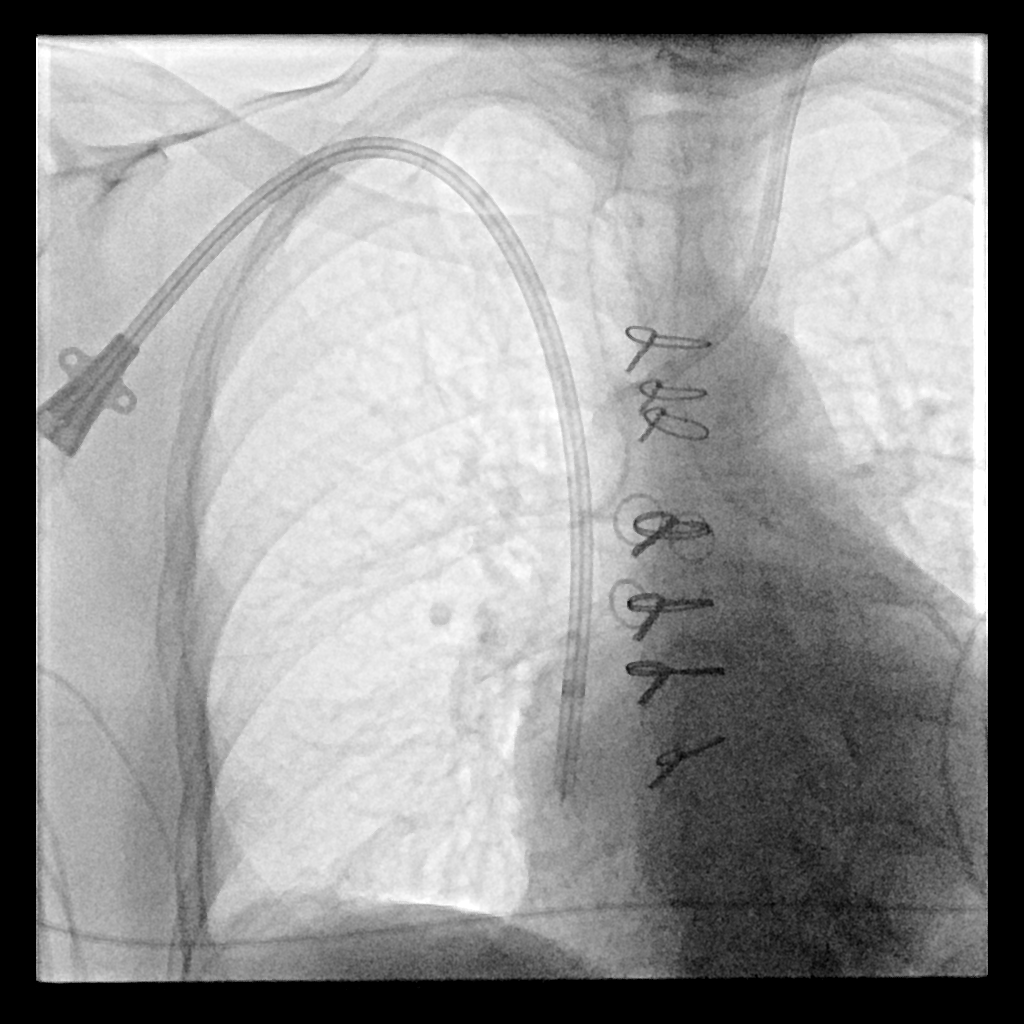
[im 2/2]
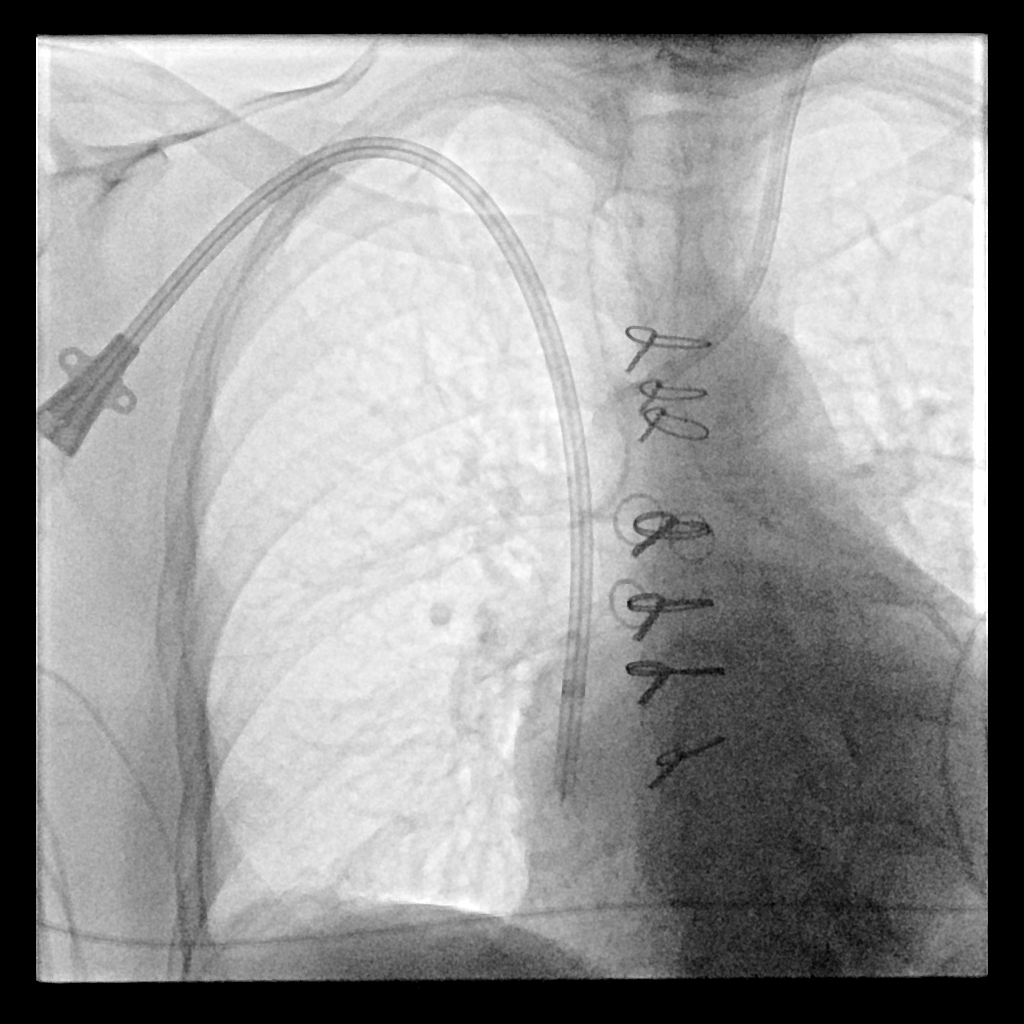

[4 of 4 positions shown; findings below may reference images not displayed]

EXAM:
TUNNELED CENTRAL VENOUS HEMODIALYSIS CATHETER PLACEMENT WITH
ULTRASOUND AND FLUOROSCOPIC GUIDANCE

ANESTHESIA/SEDATION:
1.0 mg IV Versed; 25 mcg IV Fentanyl.

Total Moderate Sedation Time:   32 minutes.

The patient's level of consciousness and physiologic status were
continuously monitored during the procedure by Radiology nursing.

MEDICATIONS:
1 g IV vancomycin.

FLUOROSCOPY TIME:  1.0 minute.  3.4 mGy.

PROCEDURE:
The procedure, risks, benefits, and alternatives were explained to
the patient's husband. Questions regarding the procedure were
encouraged and answered. The patient's husband understands and
consents to the procedure. A timeout was performed prior to
initiating the procedure.

The right neck and chest were prepped with chlorhexidine in a
sterile fashion, and a sterile drape was applied covering the
operative field. Maximum barrier sterile technique with sterile
gowns and gloves were used for the procedure. Local anesthesia was
provided with 1% lidocaine.

Ultrasound was used to evaluate patency of right internal and
external jugular veins. After creating a small venotomy incision, a
21 gauge needle was advanced into the right external jugular vein
under direct, real-time ultrasound guidance. Ultrasound image
documentation was performed. After securing guidewire access, an 8
Fr dilator was placed. A J-wire was kinked to measure appropriate
catheter length.

A Palindrome tunneled hemodialysis catheter measuring 19 cm from tip
to cuff was chosen for placement. This was tunneled in a retrograde
fashion from the chest wall to the venotomy incision.

At the venotomy, serial dilatation was performed and a 15 Fr
peel-away sheath was placed over a guidewire. The catheter was then
placed through the sheath and the sheath removed. Final catheter
positioning was confirmed and documented with a fluoroscopic spot
image. The catheter was aspirated, flushed with saline, and injected
with appropriate volume heparin dwells.

The venotomy incision was closed with subcuticular 4-0 Vicryl.
Dermabond was applied to the incision. The catheter exit site was
secured with 0-Prolene retention sutures.

Following tunnel catheter placement, the left jugular non tunneled
temporary hemodialysis catheter was removed and manual compression
applied over the catheter exit site.

COMPLICATIONS:
None.  No pneumothorax.
FINDINGS: The right internal jugular vein is chronically occluded. There is a
patent external jugular vein present which empties directly into the
medial subclavian vein. The medial aspect of the external jugular
vein near its juncture with the subclavian vein was accessed for
catheter placement. After new tunneled catheter placement, the tip
lies in the right atrium. The catheter aspirates normally and is
ready for immediate use.
IMPRESSION: Placement of tunneled hemodialysis catheter via the right external
jugular vein. The catheter tip lies in the right atrium. The
catheter is ready for immediate use. After placement, the non
tunneled left jugular temporary dialysis catheter was removed.

## 2020-06-17 IMAGING — XA IR THROMBECTOMY AV FISTULA W/THROMBOLYSIS INC/SHUNT/IMG*L*
9 of 14 series · 13 of 24 positions shown · non-contrast
Comparison: none

CLINICAL DATA: Occluded left upper arm brachial to axillary
dialysis graft.

[Series 1: ir thrombectomy av fistula w/thrombolysis inc/shun · 1 of 2 slices shown]
[im 1/2]
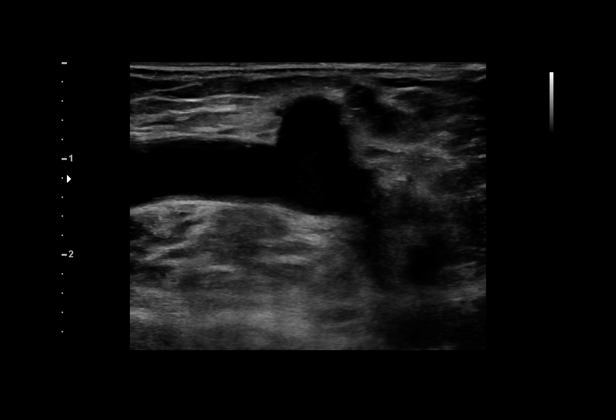

[Series 2: body 4 care · 2 of 5 slices shown (1 of 3)]
[im 1/5]
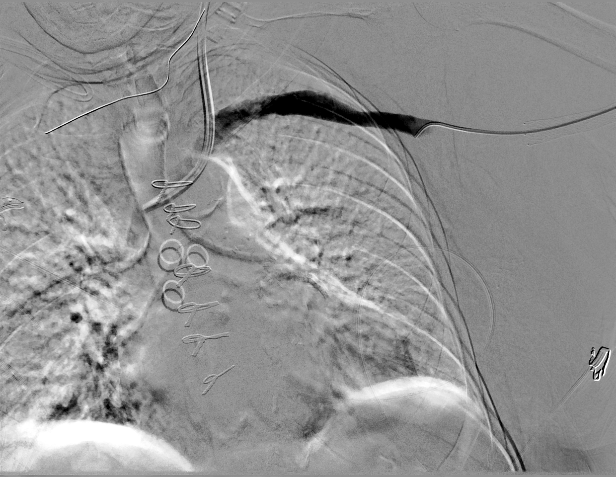
[im 5/5]
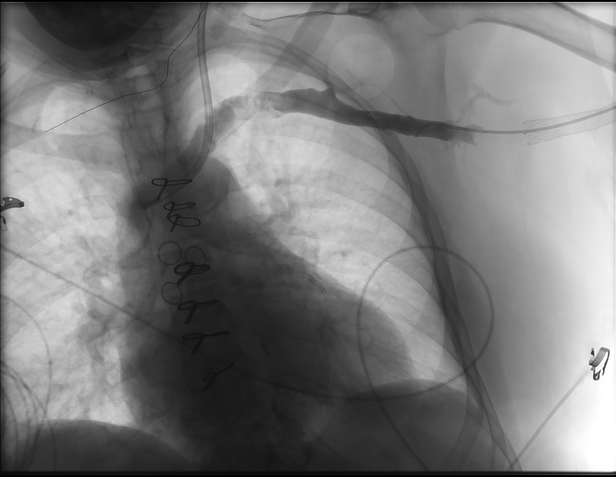

[Series 4: fl (-) angio · 1 of 1 slices shown (1 of 5)]
[im 1/1]
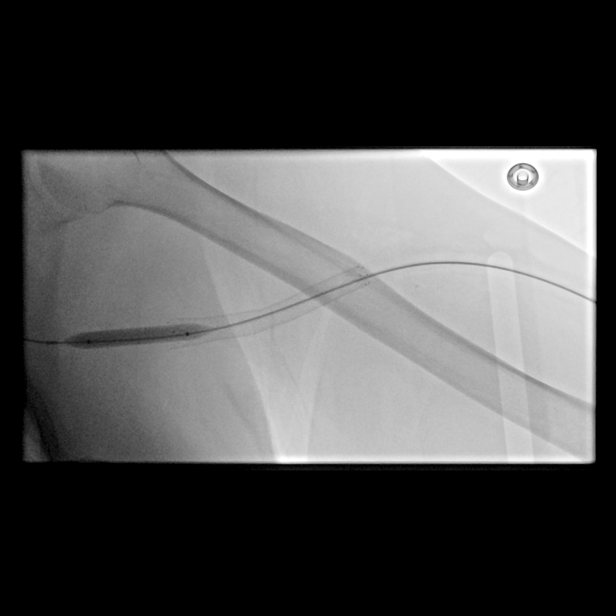

[Series 6: fl (-) angio · 1 of 1 slices shown (2 of 5)]
[im 1/1]
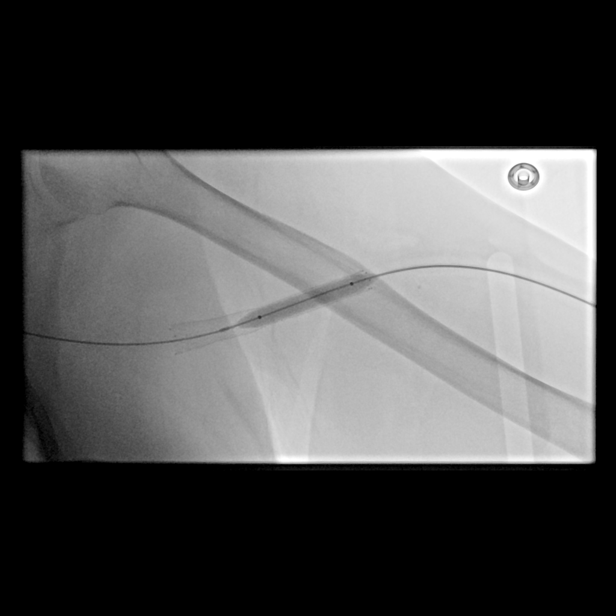

[Series 8: fl (-) angio · 1 of 1 slices shown (3 of 5)]
[im 1/1]
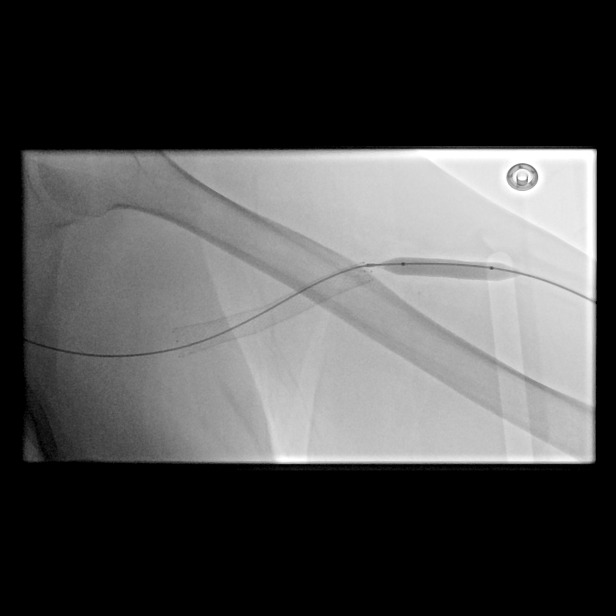

[Series 10: fl (-) angio · 1 of 1 slices shown (4 of 5)]
[im 1/1]
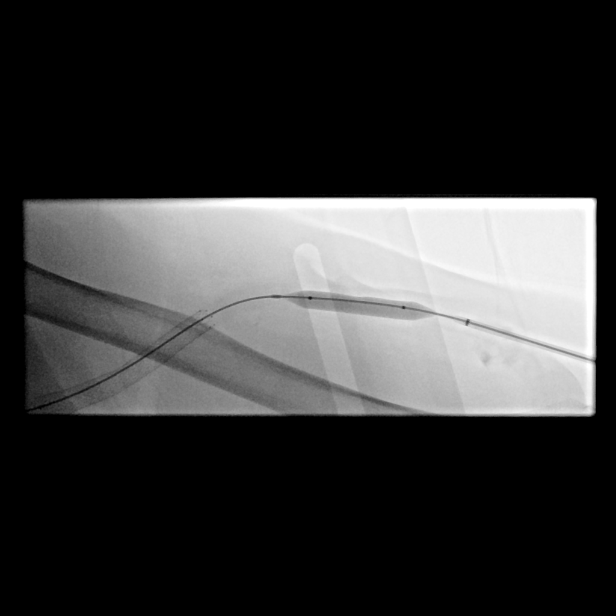

[Series 13: fl (-) angio · 1 of 1 slices shown (5 of 5)]
[im 1/1]
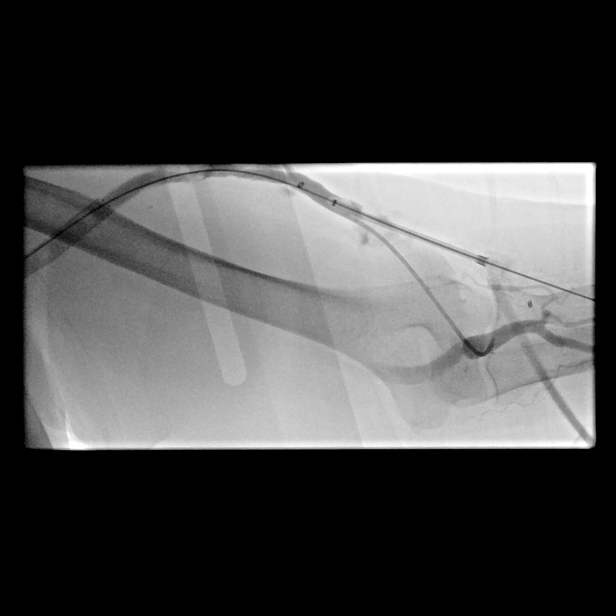

[Series 14: body 4 care · 1 of 4 slices shown (2 of 3)]
[im 4/4]
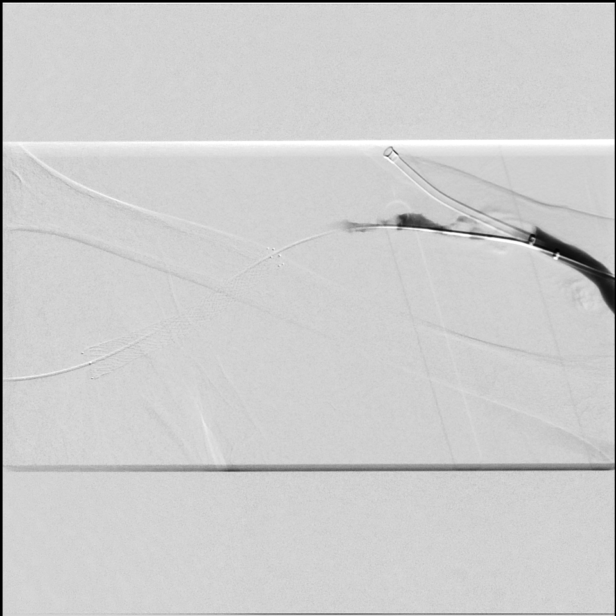

[Series 16: body 4 care · 4 of 10 slices shown (3 of 3)]
[im 2/10]
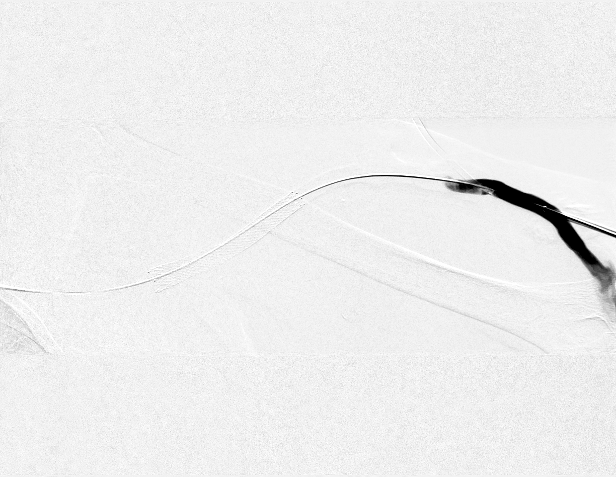
[im 4/10]
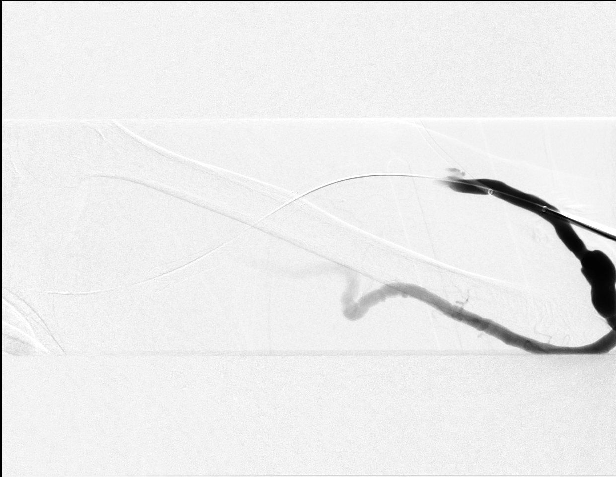
[im 7/10]
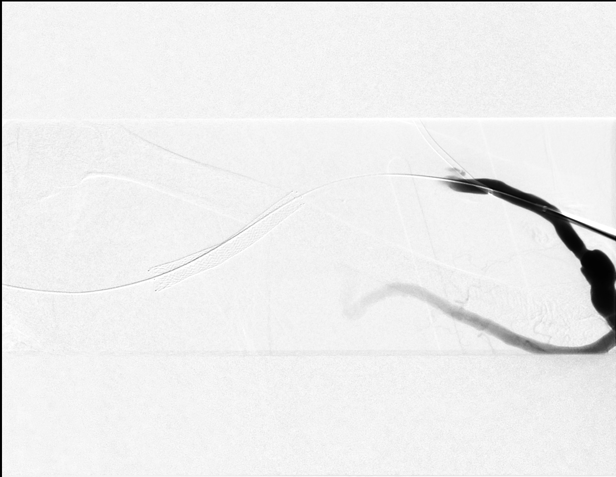
[im 10/10]
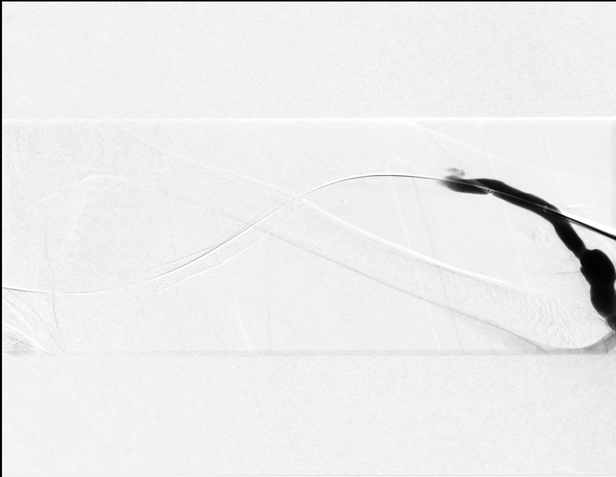

[13 of 24 positions shown; findings below may reference images not displayed]

EXAM:
1. ULTRASOUND GUIDANCE FOR VASCULAR ACCESS OF LEFT UPPER ARM
DIALYSIS GRAFT.
2. ABORTED AND PARTIALLY COMPLETED DIALYSIS FISTULA DECLOT PROCEDURE
WITH TWO SEPARATE GRAFT ACCESS SITES.
3. VENOUS ANGIOPLASTY OF DIALYSIS GRAFT AND VENOUS OUTFLOW.

ANESTHESIA/SEDATION:
Moderate (conscious) sedation was employed during this procedure. A
total of Versed 2.0 mg and Fentanyl 125 mcg was administered
intravenously.

Moderate Sedation Time: 108 minutes. The patient's level of
consciousness and vital signs were monitored continuously by
radiology nursing throughout the procedure under my direct
supervision.

CONTRAST:  75mL OMNIPAQUE IOHEXOL 300 MG/ML  SOLN

MEDICATIONS:
2 mg tPA, [1A] U IV heparin

FLUOROSCOPY TIME:  9 minutes and 18 seconds.  15.5 mGy.

PROCEDURE:
The procedure, risks, benefits, and alternatives were explained to
the patient. Questions regarding the procedure were encouraged and
answered. The patient understands and consents to the procedure. A
time-out was performed prior to initiating the procedure.

Ultrasound interrogation of the left arm dialysis graft was
performed. The left upper arm dialysis graft was prepped with
chlorhexidine in a sterile fashion, and a sterile drape was applied
covering the operative field. A sterile gown and sterile gloves were
used for the procedure. Local anesthesia was provided with 1%
Lidocaine.

Preliminary ultrasound was performed of the dialysis fistula.
Ultrasound-guided antegrade graft access was performed with a
micropuncture set. Ultrasound image documentation was performed. 1
mg of t-PA was instilled. A 6 French sheath was placed over a
guidewire. A diagnostic catheter was advanced and contrast injection
performed at the level of patent venous outflow. Outflow venography
was also performed via the catheter with visualization of central
venous outflow.

Angiojet thrombectomy was performed throughout the left upper arm
graft and at the level of a venous outflow stent. Balloon
angioplasty was performed at the level of the venous outflow stent
and within the dialysis graft with a 7 mm x 4 cm Conquest balloon.

Retrograde access of the dialysis graft was then performed with a
micropuncture set under ultrasound guidance. A 6 French sheath was
placed. Thrombectomy across the arterial anastomosis and within the
proximal graft was then performed with a 4-French Fogarty balloon
catheter. Several passes were made with the Fogarty catheter.
Suction thrombectomy was then performed through both sheaths.

Graft patency was reassessed with angiography. This was followed by
additional Angiojet thrombectomy in both antegrade and retrograde
directions and additional balloon angioplasty with the 7 mm balloon.
Ultimately, the procedure was aborted and both sheaths removed with
application Ethilon pursestring sutures.

COMPLICATIONS:
None
FINDINGS: Ultrasound confirms thrombosis of the graft. The arterial
anastomosis appeared patent by ultrasound. Just beyond the arterial
anastomosis, there is dilatation of the proximal graft followed by
thrombus causing occlusion of the graft.

After graft access, there also is visualization of an elongated
stent which extends from the distal graft, across the venous
anastomosis and into the outflow axillary vein. The entire stent is
occluded and thrombus extends into the axillary vein beyond the
stent.

After initial mechanical thrombectomy and Fogarty thrombectomy, the
proximal aspect of the dialysis graft demonstrated normal patency
including the arterial anastomosis and approximately half of the
dialysis graft. The lumen of the indwelling venous outflow stent was
also able to be dilated. However, there was an area of resistant
stenosis within the midportion of the graft that could be dilated
but demonstrated persistent collapse and recurrent occlusion
suggestive of focally deteriorated graft material. It was not felt
that a covered stent could be placed in this region as this is
currently a segment that the graft would need to be accessed for
dialysis access. At this point during the procedure, it was decided
to abort further attempt at percutaneous intervention and proceed
with placement of a tunneled dialysis catheter for immediate
dialysis needs. The patient will need eventual surgical revision of
the dialysis graft versus new access.
IMPRESSION: Lack of ability to maintain patency of an occluded left upper arm
brachial to axillary vein dialysis graft primarily due to what
appears to be a deteriorated and resistant segment within the
midportion of the graft. An indwelling stent is present from the
region of the distal graft into the outflow axillary vein.

ACCESS:
Due to inability to maintain patency of the occluded dialysis graft,
a tunneled dialysis catheter was placed following the procedure.
This procedure is dictated separately. It is not felt that repeat
attempt at percutaneous declot would be fruitful. Recommend vascular
surgical consultation for consideration of surgical revision of the
current access versus placement of new access.

## 2020-06-17 MED ORDER — LIDOCAINE HCL 1 % IJ SOLN
INTRAMUSCULAR | Status: AC | PRN
  Administered 2020-06-17: 10 mL

## 2020-06-17 MED ORDER — MIDAZOLAM HCL 2 MG/2ML IJ SOLN
INTRAMUSCULAR | Status: AC
  Filled 2020-06-17: qty 2

## 2020-06-17 MED ORDER — FENTANYL CITRATE (PF) 100 MCG/2ML IJ SOLN
INTRAMUSCULAR | Status: AC | PRN
Start: 2020-06-17 — End: 2020-06-17
  Administered 2020-06-17: 50 ug via INTRAVENOUS
  Administered 2020-06-17 (×4): 25 ug via INTRAVENOUS

## 2020-06-17 MED ORDER — ALTEPLASE 2 MG IJ SOLR
INTRAMUSCULAR | Status: AC
  Filled 2020-06-17: qty 2

## 2020-06-17 MED ORDER — HEPARIN SODIUM (PORCINE) 1000 UNIT/ML IJ SOLN
INTRAMUSCULAR | Status: AC | PRN
  Administered 2020-06-17: 1000 [IU] via INTRAVENOUS

## 2020-06-17 MED ORDER — HEPARIN SODIUM (PORCINE) 1000 UNIT/ML IJ SOLN
INTRAMUSCULAR | Status: AC
  Filled 2020-06-17: qty 1

## 2020-06-17 MED ORDER — GABAPENTIN 100 MG PO CAPS: 100.0000 mg | ORAL_CAPSULE | Freq: Every day | ORAL | Status: DC

## 2020-06-17 MED ORDER — VANCOMYCIN HCL IN DEXTROSE 1-5 GM/200ML-% IV SOLN: 1000.0000 mg | Freq: Once | INTRAVENOUS | Status: AC

## 2020-06-17 MED ORDER — IOHEXOL 300 MG/ML  SOLN
100.0000 mL | Freq: Once | INTRAMUSCULAR | Status: AC | PRN
  Administered 2020-06-17: 75 mL via INTRAVENOUS

## 2020-06-17 MED ORDER — LIDOCAINE HCL 1 % IJ SOLN
INTRAMUSCULAR | Status: AC
  Filled 2020-06-17: qty 20

## 2020-06-17 MED ORDER — MIDAZOLAM HCL 2 MG/2ML IJ SOLN
INTRAMUSCULAR | Status: AC | PRN
  Administered 2020-06-17 (×4): 0.5 mg via INTRAVENOUS
  Administered 2020-06-17: 1 mg via INTRAVENOUS

## 2020-06-17 MED ORDER — VANCOMYCIN HCL IN DEXTROSE 1-5 GM/200ML-% IV SOLN
INTRAVENOUS | Status: AC
  Administered 2020-06-17: 1000 mg via INTRAVENOUS
  Filled 2020-06-17: qty 200

## 2020-06-17 MED ORDER — ALUM & MAG HYDROXIDE-SIMETH 200-200-20 MG/5ML PO SUSP
30.0000 mL | Freq: Once | ORAL | Status: DC
  Filled 2020-06-17: qty 30

## 2020-06-17 MED ORDER — FENTANYL CITRATE (PF) 100 MCG/2ML IJ SOLN
INTRAMUSCULAR | Status: AC
  Filled 2020-06-17: qty 2

## 2020-06-17 MED ORDER — GABAPENTIN 100 MG PO CAPS
100.0000 mg | ORAL_CAPSULE | Freq: Every day | ORAL | Status: DC
  Administered 2020-06-17 – 2020-06-19 (×3): 100 mg via ORAL
  Filled 2020-06-17 (×3): qty 1

## 2020-06-17 NOTE — Progress Notes (Signed)
OT Cancellation Note  Patient Details Name: LYLIE BLACKLOCK MRN: [id number] DOB: 04/18/1941   Cancelled Treatment:    Reason Eval/Treat Not Completed: Patient at procedure or test/ unavailable (Pt in IR)  Malka So 06/17/2020, 1:40 PM  Nestor Lewandowsky, OTR/L Acute Rehabilitation Services Pager: [phone] Office: [phone]

## 2020-06-17 NOTE — Progress Notes (Addendum)
Family Medicine Teaching Service Daily Progress Note Intern Pager: [phone]  Patient name: Shawna [name] Medical record number: [id number] Date of birth: [birth year] Age: 79 y.o. Gender: female  Primary Care Provider: [name], MD Consultants:nephro, Vascular, IR Code Status:Full  Pt Overview and Major Events to Date: 12/4 - Admitted to FPTS, HD  Assessment and Plan: Shawna K Moyeris a 79 y.o.femalepresenting with Clotted AVF. PMH is significant forESRD on HD(MWF), HTN, HLD, GERD, CADs/p CABG in 2013 and chronic occlusion of D1 stent and PDA (75% occlusion on 1/20) with patent grafts, Afibnot on anticoagulation due to prior GI bleed, Anxiety, Anemia, and Chronic GI bleed.  Acute Asymptomatic Anemia Anemia of Chronic Kidney Disease: Pt denies any bleeding in stool or black tarry stools.Denies any complaints.Hgb9.8yesterday. Baseline 8-9.Pt received 1units pf PRBC's at this admission.FOBT negative on 12/4.Pt had a history of GI bleed in the past. Had angiodysplasia. -Aranesp per nephrology with HD. - Q4 vitals -Continue to monitor daily CBC's. Send it today. -Consult GI if Hbdropsagain.  Hypertensive Emergency2/69missed HDdue to Clotted AVF Essential HTN BP's initially with systolic of 606'T. S/p temp HD catheter placement and urgent HD on 12/4 and also on 06/15/20. Morning BP is better at 163/64 mmHg. Vascular surgery consulted and recommended thrombolysis by IR. Pt 's procedure was cancelled yesterday due to multiple emergent procedures in the IR. Procedure is planned today in the afternoon at 1 pm with Prednisone and Benadryl prep before the procedure.  - continue HD per nephrology - Pt will have LUE HD graft thrombolysis today afternoon. - Prednisone and Benadryl prep before procedure as ordered by Vascular. - Pt is NPOtoday pending procedure. - monitor BP's  - Vascular consulted, appreciate recs,  -Keep oxygen saturation more than 92% -Will Recheck  after procedure today.  Acute hypoxic respiratory failure,resolved This morning she denies any shortness of breath and chest pain, pt is sating at 96% at Room air. Pt didn't use CPAP at night.Per nephrology note, hx of acute respiratory failure requiring intubation for similar presentation.  -Continue cardiac monitoring -EKG as needed -Strict I/O encourage using BIPAP at Night.  Chest tightness Elevated troponin: resolved Pt denies any chest pain today. H/oCAD s/p CABG 2013, DES 2019. Trops trended down after HD [phone]). EKG didn't show any ST changes. She follows with cardiology and last seen August 2021.Home meds includesaspirin 81 mg daily and Imdur 60 mg 2 times daily.  - Holdhome aspirin. -Continue home Imdur  -Repeat EKG if recurs -Nitroglycerin sublingual 0.4 mg as needed for chest tightness  ESRD on HD Today creatinine is 9.87  and BUN is 72 . Pt is on HD (MWF) Pt had  HD on 12/4 and 12/6. May get another HD today.Temp HD catheter in place in the Left side of Neck. IR is recommending thrombolysis for clotted AVF. Patient got another HD today. -Nephrology Consulted. Appreciate recommendations and management. -Avoid nephrotoxic meds -Daily RFP -Continue Renvela.  HLD Home meds:simvastatin 20 mg daily at bedtime. Lipid panel 12/4: WNL -Continue home simvastatin.  GERD She denies any strict reflux.Home medsinclude Prilosec 40 mg. Protonix not started due to allergy.  -Hold Prilosec  Chronic Diastolic HF Last echo in June 2020 showed normalLVsystolic function of 60 to 65% with mild LVhypertrophy and impaired relaxation left ventricle and low normal systolic function of right ventricle, left atrium mildly dilated.  -HTN control as above   Atrial fibrillation  TSH Not on anticoagulation due to hx of GIB.Home meds:amiodarone 200 mg daily.  -Continue amiodarone 200 mg  daily -Continue cardiac monitoring  Anxiety Disorder Home meds:Xanax 0.5  mg at bedtime as needed for anxiety. Appears she has not had filled in some time. Given infrequent use, will discontinue and reorder one time dose if patient requests.  Hx of GI bleed from colonic AVM's Pt has history of small bowel ischemic injury and ileo-colonic anastomosis few years ago. Last colonoscopy on 02/2019 showed Diverticulosis in colon, Patent end-to-side ileo-colonic anastomosis, Two small polyps in the proximal ascending colon and One large polyp in the proximal transverse colon which were removed. FOBT negative on 12/4. This morning Pt deniesany bleeding in stool or deniesblack tarry stool.  -Continue to monitor for bleeding. -Can start aspirin today.  FEN/GI: NPO for procedure today. Prophylaxis:HoldingHeparin for Procedure.SCD until we start Heparin again.  Disposition: Med telemetry observation Pt is from:Home Planned Disposition- To be determined. Expected Discharge date 06/18/20 Pt is not medically stable for discharge.  Subjective:  Patient denies shortness of breath, chest pain, abdominal pain, dizziness, headache, blurry vision or nausea and vomiting . patient denies any complaint  Objective: Temp:  [97.7 F (36.5 C)-98.6 F (37 C)] 98.6 F (37 C) (12/08 0523) Pulse Rate:  [74-88] 74 (12/08 0523) Resp:  [16-20] 20 (12/08 0523) BP: (173-197)/(65-83) 195/79 (12/08 0523) SpO2:  [96 %-97 %] 96 % (12/07 1947) Weight:  [136 lb 11 oz (62 kg)-140 lb (63.5 kg)] 136 lb 11 oz (62 kg) (12/08 0313) Physical Exam: General: Patient lying in bed comfortably not in acute distress temporary HD cath in left side of neck without erythema, edema, discharge or bleeding Cardiovascular: RRR, no murmurs, rubs, gallops Respiratory: Clear to auscultation bilaterally. No wheezes, rales, rhonchi Abdomen: Soft, nontender, bowel sounds present Extremities: Bilateral extremity pulses strong. No edema present. SCDs in place.   Laboratory: Recent Labs  Lab 06/14/20 0154  06/14/20 0542 06/14/20 1550 06/15/20 0517 06/16/20 0344  WBC 5.4  --   --  5.2 7.8  HGB 7.1*   < > 8.0* 8.1* 9.8*  HCT 21.6*   < > 25.3* 25.9* 31.2*  PLT 111*  --   --  157 190   < > = values in this interval not displayed.   Recent Labs  Lab 06/15/20 0517 06/16/20 0344 06/17/20 0116  NA 140 138 138  K 4.2 4.9 4.5  CL 100 101 99  CO2 22 23 21*  BUN 70* 40* 72*  CREATININE 11.65* 7.76* 9.87*  CALCIUM 8.4* 9.5 9.3  GLUCOSE 90 136* 143*      Imaging/Diagnostic Tests: Arlester Marker, MD 06/17/2020, 6:41 AM PGY-1, [facility] Intern pager: [phone], text pages welcome

## 2020-06-17 NOTE — Progress Notes (Signed)
Paged by nurse regarding return of sharp chest pain.  Nurse gave second sublingual nitro around 9:30 PM, and third just after 10 PM.  Per nurse, patient reports pain is similar to sharp chest pain at 8:30 PM, "just less of it".  Earlier stat EKG was unchanged from previous EKGs days prior.  Currently awaiting stat troponin result.  As this sharp pain started right after eating and is located in the epigastric region, wonder if acid reflux a component of his pain.  As she is ESRD patient, I want to be careful with magnesium containing GI cocktails.  Spoke with pharmacist who confirmed a once dose Maalox GI cocktail would be acceptable.  -We will follow up stat troponin -Consider cards consult if troponin elevated -Continue to follow chest pain -Monitor vitals  Shawna Essex, MD

## 2020-06-17 NOTE — Procedures (Addendum)
Interventional Radiology Procedure Note  Procedure: Tunneled right EJ HD catheter placement  Complications: None  Estimated Blood Loss: < 10 mL  Findings: 19 cm tip to cuff length Palindrome tunneled HD catheter placed with tip in RA. Access at base of right EJ vein. OK to use.  Left neck temp cath removed.  Venetia Night. Kathlene Cote, M.D Pager:  [phone]

## 2020-06-17 NOTE — Progress Notes (Signed)
FPTS Interim Progress Note  S: Went to see Ms. Yingling after nurse paged about sudden onset sharp chest pain relieved by nitro.  When I arrived to her room, she was sitting up in bed eating dinner with her husband at the bedside.  Patient reports sudden onset sharp pain "all across my chest".  She points to her epigastrium and ribs.  Says that it felt as though it was wrapping around to her back.  Started just after she began eating her dinner.  She denies dizziness, near syncope, vision changes, diaphoresis, crushing chest pain, other radiation, nausea, and vomiting.  Relieved by 1 sublingual nitro.  She reports that she normally gets sharp chest pains during exertion that are relieved by nitro, however chest pain at rest is unusual for her.  O: BP (!) 144/58 (BP Location: Right Arm)   Pulse 80   Temp 97.6 F (36.4 C) (Oral)   Resp 20   Ht 5\' 1"  (1.549 m)   Wt 62 kg   SpO2 93%   BMI 25.83 kg/m   General: Awake, alert, oriented, no acute distress, eating dinner Respiratory: Clear to auscultation bilaterally Cardiac: Regular rate and rhythm, no murmurs appreciated, normal inspiratory variability in heart rate noted Abdomen: No TTP in any quadrants  A/P: -Stat EKG unchanged from previous -Stat troponins pending -Advised patient to let us know if chest pain returns -Please page [place] at the number below with any questions or concerns  Ezequiel Essex, MD 06/17/2020, 9:24 PM PGY-1, [facility] pager [phone]

## 2020-06-17 NOTE — Progress Notes (Signed)
[name] NEPHROLOGY PROGRESS NOTE  Assessment/ Plan: Pt is a 79 y.o. yo female ESRD on HD, CAD CABG admitted with clotted AV fistula, shortness of breath and hypertensive urgency.  OP HD: [name]   3.5h  300/600  61kg  2/2.5 bath  LUE AVF  Hep none  - hect 2.5 ug tiw  - epo 8000 IV tiw   #SOB/ hypertensive urgency/ pulm edema: Improved after dialysis.  Currently in room air and BP controlled.  UF during HD.  # Clotted LUE AVF - IR consulted for declot vs TDC placement.  The IR procedure has been postponed multiple times, hopefully she will get it done today.   #ESRD- usual HD MWF. HD temp cath placed by ED and had HD 12/3.  Plan for HD today.  # Chest pain: better. Prob due to HTN/ volume overload.  #Hypertension/volume: Did not get a.m. medication because of n.p.o.  Monitor BP.  UF as tolerated.  # Anemia: Received blood transfusion.  Continue aranesp on HD.  #Secondary hyperparathyroidism: Renvela dose increased, monitor phosphorus level.  #Dispo: Okay to discharge after IR procedure and dialysis today.  Subjective: Seen and examined.  Waiting for IR procedure.  No complaint.  HD today. Objective Vital signs in last 24 hours: Vitals:   06/17/20 0018 06/17/20 0313 06/17/20 0523 06/17/20 0903  BP:   (!) 195/79 (!) 163/64  Pulse:   74 66  Resp:   20 16  Temp:   98.6 F (37 C)   TempSrc:   Oral   SpO2:    97%  Weight: 63.5 kg 62 kg    Height:       Weight change: -0.596 kg  Intake/Output Summary (Last 24 hours) at 06/17/2020 0925 Last data filed at 06/17/2020 0021 Gross per 24 hour  Intake 610 ml  Output 0 ml  Net 610 ml       Labs: Basic Metabolic Panel: Recent Labs  Lab 06/15/20 0517 06/16/20 0344 06/17/20 0116  NA 140 138 138  K 4.2 4.9 4.5  CL 100 101 99  CO2 22 23 21*  GLUCOSE 90 136* 143*  BUN 70* 40* 72*  CREATININE 11.65* 7.76* 9.87*  CALCIUM 8.4* 9.5 9.3  PHOS 8.0* 5.4* 6.3*   Liver Function Tests: Recent Labs   Lab 06/15/20 0517 06/16/20 0344 06/17/20 0116  ALBUMIN 2.4* 2.9* 2.8*   No results for input(s): LIPASE, AMYLASE in the last 168 hours. No results for input(s): AMMONIA in the last 168 hours. CBC: Recent Labs  Lab 06/12/20 1505 06/12/20 1505 06/13/20 0956 06/13/20 0956 06/14/20 0154 06/14/20 0542 06/14/20 1550 06/15/20 0517 06/16/20 0344  WBC 5.3   < > 9.7   < > 5.4  --   --  5.2 7.8  HGB 8.4*   < > 9.7*   < > 7.1*   < > 8.0* 8.1* 9.8*  HCT 28.4*   < > 31.0*   < > 21.6*   < > 25.3* 25.9* 31.2*  MCV 116.4*  --  112.3*  --  109.6*  --   --  108.4* 108.3*  PLT 256   < > 206   < > 111*  --   --  157 190   < > = values in this interval not displayed.   Cardiac Enzymes: No results for input(s): CKTOTAL, CKMB, CKMBINDEX, TROPONINI in the last 168 hours. CBG: Recent Labs  Lab 06/13/20 1123  GLUCAP 138*    Iron Studies: No  results for input(s): IRON, TIBC, TRANSFERRIN, FERRITIN in the last 72 hours. Studies/Results: No results found.  Medications: Infusions:   Scheduled Medications: . amiodarone  200 mg Oral Daily  . aspirin EC  81 mg Oral Daily  . Chlorhexidine Gluconate Cloth  6 each Topical Q0600  . darbepoetin (ARANESP) injection - DIALYSIS  60 mcg Intravenous Q Mon-HD  . dicyclomine  10 mg Oral BID  . diphenhydrAMINE  50 mg Oral Once   Or  . diphenhydrAMINE  50 mg Intravenous Once  . gabapentin  100 mg Oral Daily  . isosorbide mononitrate  60 mg Oral BID  . predniSONE  50 mg Oral Q6H  . sevelamer carbonate  1,600 mg Oral TID WC  . simvastatin  20 mg Oral QHS  . sodium chloride flush  10-40 mL Intracatheter Q12H    have reviewed scheduled and prn medications.  Physical Exam: General:NAD, comfortable Heart:RRR, s1s2 nl Lungs: Clear b/l, no crackle Abdomen:soft, Non-tender, non-distended Extremities:No edema Dialysis Access: Left upper extremity AV fistula has no bruit, L IJ temporary HD catheter in place.  Shawna [name] Shawna [name] 06/17/2020,9:25 AM   LOS: 3 days  Pager: [phone]

## 2020-06-17 NOTE — Procedures (Signed)
Interventional Radiology Procedure Note  Procedure: Aborted dialysis graft declot procedure with thrombectomy and angioplasty  Complications: None  Estimated Blood Loss: 20 mL  Findings: Arterial anastamosis and proximal segment of left brachial to axillary HD graft was able to be opened with normal patency. Graft and previously placed venous outflow stent treated with Angiojet thrombectomy and also dilated with 7 mm balloon. Unable to maintain graft patency due to persistent occlusion in a segment of the mid graft. Procedure aborted.   Plan: Tunneled HD catheter placement  Shawna [name] Shawna [name], M.D Pager:  [phone]

## 2020-06-17 NOTE — Progress Notes (Signed)
Complains of sudden onset substernal chest pain. x1 SL nitro given. Relief reported, 2nd nitro refused. Notified MD. MD will assess.

## 2020-06-17 NOTE — Evaluation (Signed)
Physical Therapy Evaluation Patient Details Name: Shawna [name] MRN: [id number] DOB: Apr 14, 1941 Today's Date: 06/17/2020   History of Present Illness  Pt adm with clotted AV fistula, anemia, acute hypoxic respiratory failure.  PMH - ESRD, HTN, CAD, CABG, afib, anemia, GI bleed.   Clinical Impression  PTA, pt live with her husband, is independent with ADL's, and uses a SPC for community ambulation Pt presents with decreased functional mobility secondary to decreased endurance, balance deficits, and weakness. BP sitting edge of bed 176/73 (104). Pt ambulating 100 feet with a walker at a min guard assist level, HR stable in 80's. Would benefit from HHPT to maximize functional independence.     Follow Up Recommendations Home health PT;Supervision for mobility/OOB    Equipment Recommendations  3in1 (PT)    Recommendations for Other Services       Precautions / Restrictions Precautions Precautions: Fall Restrictions Weight Bearing Restrictions: No      Mobility  Bed Mobility Overal bed mobility: Modified Independent             General bed mobility comments: No physical assist required    Transfers Overall transfer level: Needs assistance Equipment used: Rolling walker (2 wheeled) Transfers: Sit to/from Stand Sit to Stand: Min guard         General transfer comment: Able to achieve stand on second attempt with min guard assist. Cues for reaching back prior to sitting  Ambulation/Gait Ambulation/Gait assistance: Min guard Gait Distance (Feet): 100 Feet Assistive device: Rolling walker (2 wheeled) Gait Pattern/deviations: Step-through pattern;Decreased stride length;Trunk flexed Gait velocity: decreased Gait velocity interpretation: <1.8 ft/sec, indicate of risk for recurrent falls General Gait Details: Cues for walker proximity, slow pace, no overt LOB  Stairs            Wheelchair Mobility    Modified Rankin (Stroke Patients Only)       Balance  Overall balance assessment: Needs assistance Sitting-balance support: Feet supported Sitting balance-Leahy Scale: Good     Standing balance support: Bilateral upper extremity supported Standing balance-Leahy Scale: Poor Standing balance comment: reliant on walker                             Pertinent Vitals/Pain Pain Assessment: Faces Faces Pain Scale: Hurts a little bit Pain Location: Dialysis site at L neck Pain Descriptors / Indicators: Grimacing;Discomfort;Guarding Pain Intervention(s): Monitored during session    Home Living Family/patient expects to be discharged to:: Private residence Living Arrangements: Spouse/significant other Available Help at Discharge: Family;Available 24 hours/day Type of Home: House Home Access: Stairs to enter Entrance Stairs-Rails: Right Entrance Stairs-Number of Steps: 1 Home Layout: One level Home Equipment: Walker - 4 wheels;Cane - single point;Shower seat      Prior Function Level of Independence: Independent with assistive device(s)         Comments: household ambulator without AD most of time, for longer distances uses SPC PRN. Reports one recent fall     Hand Dominance   Dominant Hand: Right    Extremity/Trunk Assessment   Upper Extremity Assessment Upper Extremity Assessment: Defer to OT evaluation    Lower Extremity Assessment Lower Extremity Assessment: Generalized weakness    Cervical / Trunk Assessment Cervical / Trunk Assessment: Kyphotic  Communication   Communication: No difficulties  Cognition Arousal/Alertness: Awake/alert Behavior During Therapy: WFL for tasks assessed/performed Overall Cognitive Status: Within Functional Limits for tasks assessed  General Comments      Exercises     Assessment/Plan    PT Assessment Patient needs continued PT services  PT Problem List Decreased strength;Decreased activity tolerance;Decreased  balance;Decreased mobility       PT Treatment Interventions DME instruction;Gait training;Stair training;Functional mobility training;Therapeutic activities;Therapeutic exercise;Balance training;Patient/family education    PT Goals (Current goals can be found in the Care Plan section)  Acute Rehab PT Goals Patient Stated Goal: Go home PT Goal Formulation: With patient Time For Goal Achievement: 07/01/20 Potential to Achieve Goals: Good    Frequency Min 3X/week   Barriers to discharge        Co-evaluation               AM-PAC PT "6 Clicks" Mobility  Outcome Measure Help needed turning from your back to your side while in a flat bed without using bedrails?: None Help needed moving from lying on your back to sitting on the side of a flat bed without using bedrails?: None Help needed moving to and from a bed to a chair (including a wheelchair)?: A Little Help needed standing up from a chair using your arms (e.g., wheelchair or bedside chair)?: A Little Help needed to walk in hospital room?: A Little Help needed climbing 3-5 steps with a railing? : A Lot 6 Click Score: 19    End of Session   Activity Tolerance: Patient tolerated treatment well Patient left: in bed;with call bell/phone within reach;with SCD's reapplied Nurse Communication: Mobility status PT Visit Diagnosis: Unsteadiness on feet (R26.81);History of falling (Z91.81);Muscle weakness (generalized) (M62.81);Difficulty in walking, not elsewhere classified (R26.2)    Time: 9449-6759 PT Time Calculation (min) (ACUTE ONLY): 18 min   Charges:   PT Evaluation $PT Eval Moderate Complexity: 1 Mod          Wyona Almas, PT, DPT Acute Rehabilitation Services Pager [phone] Office [phone]   Deno Etienne 06/17/2020, 10:11 AM

## 2020-06-17 NOTE — Progress Notes (Signed)
Tai Tijani, RN called and notifiedthe pt HD treatment has been moved to 06/18/20.

## 2020-06-17 NOTE — Progress Notes (Signed)
   06/17/20 0900  Clinical Encounter Type  Visited With Patient;Health care provider  Visit Type Initial  Referral From Nurse  Consult/Referral To Chaplain  Spiritual Encounters  Spiritual Needs Literature  The chaplain visited with the patient. The patient was completing physical therapy. The chaplain provided AD education for the patient. After providing the education the patient decided she did not need an AD. Her husband will make any decisions in the event she is unable to make decisions. She has been married 9 years. The chaplain provided social and emotional support as the patient is ready to go home.

## 2020-06-18 ENCOUNTER — Encounter (HOSPITAL_COMMUNITY): Payer: Medicare HMO

## 2020-06-18 DIAGNOSIS — I10 Essential (primary) hypertension: Secondary | ICD-10-CM

## 2020-06-18 DIAGNOSIS — I2510 Atherosclerotic heart disease of native coronary artery without angina pectoris: Secondary | ICD-10-CM

## 2020-06-18 DIAGNOSIS — I953 Hypotension of hemodialysis: Secondary | ICD-10-CM

## 2020-06-18 DIAGNOSIS — R079 Chest pain, unspecified: Secondary | ICD-10-CM

## 2020-06-18 LAB — BPAM RBC
Blood Product Expiration Date: 202201042359
Blood Product Expiration Date: 202201072359
Blood Product Expiration Date: 202201082359
ISSUE DATE / TIME: 202112051040
Unit Type and Rh: 5100
Unit Type and Rh: 5100
Unit Type and Rh: 5100

## 2020-06-18 LAB — TYPE AND SCREEN
ABO/RH(D): O POS
Antibody Screen: NEGATIVE
Donor AG Type: NEGATIVE
Donor AG Type: NEGATIVE
Unit division: 0
Unit division: 0
Unit division: 0

## 2020-06-18 LAB — RENAL FUNCTION PANEL
Albumin: 2.8 g/dL — ABNORMAL LOW (ref 3.5–5.0)
Anion gap: 19 — ABNORMAL HIGH (ref 5–15)
BUN: 110 mg/dL — ABNORMAL HIGH (ref 8–23)
CO2: 19 mmol/L — ABNORMAL LOW (ref 22–32)
Calcium: 8.8 mg/dL — ABNORMAL LOW (ref 8.9–10.3)
Chloride: 100 mmol/L (ref 98–111)
Creatinine, Ser: 13.37 mg/dL — ABNORMAL HIGH (ref 0.44–1.00)
GFR, Estimated: 3 mL/min — ABNORMAL LOW (ref >60)
Glucose, Bld: 125 mg/dL — ABNORMAL HIGH (ref 70–99)
Phosphorus: 7.9 mg/dL — ABNORMAL HIGH (ref 2.5–4.6)
Potassium: 5.3 mmol/L — ABNORMAL HIGH (ref 3.5–5.1)
Sodium: 138 mmol/L (ref 135–145)

## 2020-06-18 LAB — CBC
HCT: 29.8 % — ABNORMAL LOW (ref 36.0–46.0)
Hemoglobin: 9.3 g/dL — ABNORMAL LOW (ref 12.0–15.0)
MCH: 33.8 pg (ref 26.0–34.0)
MCHC: 31.2 g/dL (ref 30.0–36.0)
MCV: 108.4 fL — ABNORMAL HIGH (ref 80.0–100.0)
Platelets: 208 10*3/uL (ref 150–400)
RBC: 2.75 MIL/uL — ABNORMAL LOW (ref 3.87–5.11)
RDW: 22.5 % — ABNORMAL HIGH (ref 11.5–15.5)
WBC: 11.5 10*3/uL — ABNORMAL HIGH (ref 4.0–10.5)
nRBC: 0.2 % (ref 0.0–0.2)

## 2020-06-18 MED ORDER — HYDROXYZINE HCL 25 MG PO TABS
ORAL_TABLET | ORAL | Status: AC
  Filled 2020-06-18: qty 1

## 2020-06-18 MED ORDER — HEPARIN SODIUM (PORCINE) 1000 UNIT/ML IJ SOLN
INTRAMUSCULAR | Status: AC
  Administered 2020-06-18: 1000 [IU]
  Filled 2020-06-18: qty 1

## 2020-06-18 MED ORDER — ISOSORBIDE MONONITRATE ER 60 MG PO TB24
60.0000 mg | ORAL_TABLET | Freq: Every day | ORAL | Status: DC
  Administered 2020-06-19 – 2020-06-20 (×2): 60 mg via ORAL
  Filled 2020-06-18 (×2): qty 1

## 2020-06-18 MED ORDER — HYDROXYZINE HCL 25 MG PO TABS
25.0000 mg | ORAL_TABLET | Freq: Once | ORAL | Status: AC
  Administered 2020-06-18: 25 mg via ORAL

## 2020-06-18 MED ORDER — METOPROLOL TARTRATE 25 MG PO TABS
25.0000 mg | ORAL_TABLET | Freq: Two times a day (BID) | ORAL | Status: DC
  Administered 2020-06-18 – 2020-06-19 (×3): 25 mg via ORAL
  Filled 2020-06-18 (×4): qty 1

## 2020-06-18 MED ORDER — CHLORHEXIDINE GLUCONATE CLOTH 2 % EX PADS
6.0000 | MEDICATED_PAD | Freq: Every day | CUTANEOUS | Status: DC
  Administered 2020-06-18: 6 via TOPICAL

## 2020-06-18 NOTE — Consult Note (Addendum)
Hospital Consult    Reason for Consult:  IR unable to declot Requesting Physician:  Dr. Carolin Sicks MRN #:  [id number]  History of Present Illness: This is a 79 y.o. female who is familiar to [facility] from prior dialysis access placement. She presented to the ED on 12/4 with clotted AV graft, shortness of breath and hypertensive urgency. She had a temporary catheter placed in ER and underwent emergent dialysis. IR was consulted for declot. Initial attempt was scheduled for 12/6 but patient was not pre- medicated for contrast allergy so procedure was postponed to 12/7. Unfortunately on 12/7 due to multiple emergent cases the patient was unable to have the declot again. This was then tentatively scheduled for 12/8. On 12/8 IR attempted a declot but due to persistent occlusion in a segment of the mid graft the procedure was aborted. They subsequently placed a tunneled right EJ HD catheter.   Patient has had several access procedures in the past. Most recently in September of 2020 she had PTA and stent placement of the venous outflow left upper arm AV access secondary to greater than 80% stenosis at the venous outflow to the axillary vein. Her brachio axillary graft was initially placed by Dr. Delana Meyer in August of 2018. Prior to that she had a brachiocephalic AV fistula that became occluded. She did have a RUE forearm loop graft that was placed after her LUE fistula occluded but a but this had to be immediately excised due to severe steal.   Vascular surgery has been consulted to evaluate patient for evaluation of her clotted graft and to assess patient for possible new access. Past Medical History:  Diagnosis Date  . Acute on chronic respiratory failure with hypoxia ([name]) 10/10/2016  . Anxiety   . Arthritis   . AVM (arteriovenous malformation) of colon   . CAD (coronary artery disease)    a. s/p CABG in 2013 b. DES to D1 in 10/2016. c. cath in 07/2018 showing patent grafts with occlusion of D1 at prior  stent site and progression of PDA disease --> medical management recommended  . Carotid artery disease ([place])    a. 46-27% LICA, 0/3500   . Chronic anemia   . Chronic bronchitis ([place])   . Chronic diastolic CHF (congestive heart failure) ([place])    a. 02/2012 Echo EF 60-65%, nl wall motion, Gr 1 DD, mod MR  . Colon cancer ([place]) 1992  . Esophageal stricture   . ESRD on hemodialysis ([place])    ESRD due to HTN, started dialysis 2011 and gets HD at [facility] with Dr Hinda Lenis on MWF schedule.  Access is LUA AVF as of Sept 2014.   Marland Kitchen GERD (gastroesophageal reflux disease)   . High cholesterol 12/2011  . History of blood transfusion 07/2011; 12/2011; 01/2012 X 2; 04/2012  . History of gout   . History of lower GI bleeding   . Hypertension   . Iron deficiency anemia   . Mitral regurgitation    a. Moderate by echo, 02/2012  . Myocardial infarction ([place])   . NSVT (nonsustained ventricular tachycardia) ([place])   . Ovarian cancer ([place]) 1992  . PAF (paroxysmal atrial fibrillation) ([place])   . Pneumonia ~ 2009  . PUD (peptic ulcer disease)   . TIA (transient ischemic attack)     Past Surgical History:  Procedure Laterality Date  . A/V SHUNTOGRAM Left 03/19/2019   Procedure: A/V SHUNTOGRAM;  Surgeon: Katha Cabal, MD;  Location: [place] [facility];  Service: Cardiovascular;  Laterality:  Left;  . ABDOMINAL HYSTERECTOMY  1992  . APPENDECTOMY  06/1990  . AV FISTULA PLACEMENT  07/2009   left upper arm  . AV FISTULA PLACEMENT Right 09/06/2016   Procedure: RIGHT FOREARM ARTERIOVENOUS (AV) GRAFT;  Surgeon: Elam Dutch, MD;  Location: [facility] OR;  Service: Vascular;  Laterality: Right;  . AV FISTULA PLACEMENT N/A 02/24/2017   Procedure: INSERTION OF ARTERIOVENOUS (AV) GORE-TEX GRAFT ARM (BRACHIAL AXILLARY);  Surgeon: Katha Cabal, MD;  Location: [facility] ORS;  Service: Vascular;  Laterality: N/A;  . [place] Right 09/06/2016   Procedure: REMOVAL OF Right Arm ARTERIOVENOUS GORETEX GRAFT and Vein Patch  angioplasty of brachial artery;  Surgeon: Angelia Mould, MD;  Location: [place];  Service: Vascular;  Laterality: Right;  . BIOPSY  09/26/2019   Procedure: BIOPSY;  Surgeon: Rogene Houston, MD;  [facility];  Service: Endoscopy;;  . COLON RESECTION  1992  . COLON SURGERY    . COLONOSCOPY N/A 03/09/2019   Procedure: COLONOSCOPY;  Surgeon: Rogene Houston, MD;  [facility];  Service: Endoscopy;  Laterality: N/A;  . CORONARY ANGIOPLASTY WITH STENT PLACEMENT  12/15/11   "2"  . CORONARY ANGIOPLASTY WITH STENT PLACEMENT  y/2013   "1; makes total of 3" (05/02/2012)  . CORONARY ARTERY BYPASS GRAFT  06/13/2012   Procedure: CORONARY ARTERY BYPASS GRAFTING (CABG);  Surgeon: Grace Isaac, MD;  Location: [place];  Service: Open Heart Surgery;  Laterality: N/A;  cabg x four;  using left internal mammary artery, and left leg greater saphenous vein harvested endoscopically  . CORONARY STENT INTERVENTION N/A 10/13/2016   Procedure: Coronary Stent Intervention;  Surgeon: Troy Sine, MD;  Location: [place] [facility];  Service: Cardiovascular;  Laterality: N/A;  . DIALYSIS/PERMA CATHETER REMOVAL N/A 04/18/2017   Procedure: DIALYSIS/PERMA CATHETER REMOVAL;  Surgeon: Katha Cabal, MD;  Location: [place] [facility];  Service: Cardiovascular;  Laterality: N/A;  . DILATION AND CURETTAGE OF UTERUS    . ESOPHAGOGASTRODUODENOSCOPY  01/20/2012   Procedure: ESOPHAGOGASTRODUODENOSCOPY (EGD);  Surgeon: Ladene Artist, MD,FACG;  Location: [facility] ENDOSCOPY;  Service: Endoscopy;  Laterality: N/A;  . ESOPHAGOGASTRODUODENOSCOPY N/A 03/26/2013   Procedure: ESOPHAGOGASTRODUODENOSCOPY (EGD);  Surgeon: Irene Shipper, MD;  Location: [facility] ENDOSCOPY;  Service: Endoscopy;  Laterality: N/A;  . ESOPHAGOGASTRODUODENOSCOPY N/A 04/30/2015   Procedure: ESOPHAGOGASTRODUODENOSCOPY (EGD);  Surgeon: Rogene Houston, MD;  [facility];  Service: Endoscopy;  Laterality: N/A;  1pm - moved to 10/20 @ 1:10   . ESOPHAGOGASTRODUODENOSCOPY N/A 07/29/2016   Procedure: ESOPHAGOGASTRODUODENOSCOPY (EGD);  Surgeon: Manus Gunning, MD;  Location: [place];  Service: Gastroenterology;  Laterality: N/A;  enteroscopy  . ESOPHAGOGASTRODUODENOSCOPY N/A 09/26/2019   Procedure: ESOPHAGOGASTRODUODENOSCOPY (EGD);  Surgeon: Rogene Houston, MD;  [facility];  Service: Endoscopy;  Laterality: N/A;  1250  . GIVENS CAPSULE STUDY N/A 03/07/2019   Procedure: GIVENS CAPSULE STUDY;  Surgeon: Rogene Houston, MD;  [facility];  Service: Endoscopy;  Laterality: N/A;  7:30  . INTRAOPERATIVE TRANSESOPHAGEAL ECHOCARDIOGRAM  06/13/2012   Procedure: INTRAOPERATIVE TRANSESOPHAGEAL ECHOCARDIOGRAM;  Surgeon: Grace Isaac, MD;  Location: [place];  Service: Open Heart Surgery;  Laterality: N/A;  . IR GENERIC HISTORICAL  07/26/2016   IR FLUORO GUIDE CV LINE RIGHT 07/26/2016 Greggory Keen, MD [facility]  . IR GENERIC HISTORICAL  07/26/2016   IR US GUIDE VASC ACCESS RIGHT 07/26/2016 Greggory Keen, MD [facility]  . IR GENERIC HISTORICAL  08/02/2016  IR US GUIDE VASC ACCESS RIGHT 08/02/2016 Greggory Keen, MD [facility]  . IR GENERIC HISTORICAL  08/02/2016   IR FLUORO GUIDE CV LINE RIGHT 08/02/2016 Greggory Keen, MD [facility]  . IR RADIOLOGY PERIPHERAL GUIDED IV START  03/28/2017  . IR US GUIDE VASC ACCESS RIGHT  03/28/2017  . LEFT HEART CATH AND CORONARY ANGIOGRAPHY N/A 09/20/2016   Procedure: Left Heart Cath and Coronary Angiography;  Surgeon: [name], MD;  Location: [place] [facility];  Service: Cardiovascular;  Laterality: N/A;  . LEFT HEART CATH AND CORS/GRAFTS ANGIOGRAPHY N/A 10/13/2016   Procedure: Left Heart Cath and Cors/Grafts Angiography;  Surgeon: Troy Sine, MD;  Location: [place] [facility];  Service: Cardiovascular;  Laterality: N/A;  . LEFT HEART CATH AND CORS/GRAFTS ANGIOGRAPHY N/A 07/13/2018   Procedure: LEFT HEART CATH AND CORS/GRAFTS ANGIOGRAPHY;  Surgeon: Martinique, Peter M,  MD;  Location: [facility];  Service: Cardiovascular;  Laterality: N/A;  . LEFT HEART CATHETERIZATION WITH CORONARY ANGIOGRAM N/A 12/15/2011   Procedure: LEFT HEART CATHETERIZATION WITH CORONARY ANGIOGRAM;  Surgeon: Burnell Blanks, MD;  Location: [facility] CATH LAB;  Service: Cardiovascular;  Laterality: N/A;  . LEFT HEART CATHETERIZATION WITH CORONARY ANGIOGRAM N/A 01/10/2012   Procedure: LEFT HEART CATHETERIZATION WITH CORONARY ANGIOGRAM;  Surgeon: Peter M Martinique, MD;  Location: [facility] CATH LAB;  Service: Cardiovascular;  Laterality: N/A;  . LEFT HEART CATHETERIZATION WITH CORONARY ANGIOGRAM N/A 06/08/2012   Procedure: LEFT HEART CATHETERIZATION WITH CORONARY ANGIOGRAM;  Surgeon: Burnell Blanks, MD;  Location: [facility] CATH LAB;  Service: Cardiovascular;  Laterality: N/A;  . LEFT HEART CATHETERIZATION WITH CORONARY/GRAFT ANGIOGRAM N/A 12/10/2013   Procedure: LEFT HEART CATHETERIZATION WITH Beatrix Fetters;  Surgeon: [name], MD;  Location: [facility] CATH LAB;  Service: Cardiovascular;  Laterality: N/A;  . OVARY SURGERY     ovarian cancer  . POLYPECTOMY  03/09/2019   Procedure: POLYPECTOMY;  Surgeon: Rogene Houston, MD;  [facility];  Service: Endoscopy;;  cecal   . POLYPECTOMY N/A 09/26/2019   Procedure: DUODENAL POLYPECTOMY;  Surgeon: Rogene Houston, MD;  [facility];  Service: Endoscopy;  Laterality: N/A;  . REVISION OF ARTERIOVENOUS GORETEX GRAFT N/A 02/24/2017   Procedure: REVISION OF ARTERIOVENOUS GORETEX GRAFT (RESECTION);  Surgeon: Katha Cabal, MD;  Location: [facility] ORS;  Service: Vascular;  Laterality: N/A;  [place] N/A 10/15/2013   Procedure: Fistulogram;  Surgeon: Serafina Mitchell, MD;  Location: [place] CATH LAB;  Service: Cardiovascular;  Laterality: N/A;  . THROMBECTOMY / ARTERIOVENOUS GRAFT REVISION  2011   left upper arm  . TUBAL LIGATION  1980's  . UPPER EXTREMITY ANGIOGRAPHY Bilateral 12/06/2016   Procedure: Upper Extremity  Angiography;  Surgeon: Katha Cabal, MD;  Location: [place] [facility];  Service: Cardiovascular;  Laterality: Bilateral;  . UPPER EXTREMITY INTERVENTION Left 06/06/2017   Procedure: UPPER EXTREMITY INTERVENTION;  Surgeon: Katha Cabal, MD;  Location: [place] [facility];  Service: Cardiovascular;  Laterality: Left;    Allergies  Allergen Reactions  . Aspirin Other (See Comments)    High Doses Mess up her stomach; "makes my bowels have blood in them". Takes 81 mg EC Aspirin   . Penicillins Other (See Comments)    SYNCOPE? , "makes me real weak when I take it; like I'll pass out"  Has patient had a PCN reaction causing immediate rash, facial/tongue/throat swelling, SOB or lightheadedness with hypotension: Yes Has patient had a PCN reaction causing severe rash  involving mucus membranes or skin necrosis: no Has patient had a PCN reaction that required hospitalization no Has patient had a PCN reaction occurring within the last 10 years: no If all of the above  . Amlodipine Swelling  . Bactrim [Sulfamethoxazole-Trimethoprim] Rash  . Contrast Media [Iodinated Diagnostic Agents] Itching  . Iron Itching and Other (See Comments)    "they gave me iron in dialysis; had to give me Benadryl cause I had to have the iron" (05/02/2012)  . Nitrofurantoin Hives  . Tylenol [Acetaminophen] Itching and Other (See Comments)    Makes her feet on fire per pt  . Gabapentin Other (See Comments)    Unknown reaction  . Hydralazine Itching  . Dexilant [Dexlansoprazole] Other (See Comments)    Upset stomach  . Levaquin [Levofloxacin In D5w] Rash  . Morphine And Related Itching and Other (See Comments)    Itching in feet  . Plavix [Clopidogrel Bisulfate] Rash  . Protonix [Pantoprazole Sodium] Rash  . Venofer [Ferric Oxide] Itching and Other (See Comments)    Patient reports using Benadryl prior to doses as [place]    Prior to Admission medications   Medication Sig Start Date End Date  Taking? Authorizing Provider  albuterol (VENTOLIN HFA) 108 (90 Base) MCG/ACT inhaler Inhale 2 puffs into the lungs every 6 (six) hours as needed for wheezing or shortness of breath.   Yes [provider]  ALPRAZolam Duanne Moron) 0.5 MG tablet Take 0.5 mg by mouth at bedtime as needed for anxiety.   Yes [provider]  amiodarone (PACERONE) 200 MG tablet Take 1 tablet (200 mg total) by mouth daily. Starting July 2 12/17/19  Yes Strader, Fransisco Hertz, PA-C  aspirin EC 81 MG tablet Take 81 mg by mouth daily.  09/27/19  Yes Rehman, Mechele Dawley, MD  cyclobenzaprine (FLEXERIL) 5 MG tablet Take 5 mg by mouth 2 (two) times daily.  05/08/20  Yes [provider]  dicyclomine (BENTYL) 10 MG capsule Take 10 mg by mouth 2 (two) times daily.    Yes [provider]  fluticasone (FLONASE) 50 MCG/ACT nasal spray Place 1 spray into both nostrils at bedtime as needed for allergies.    Yes [provider]  gabapentin (NEURONTIN) 100 MG capsule Take 100 mg by mouth daily.  04/16/20  Yes [provider]  isosorbide mononitrate (IMDUR) 60 MG 24 hr tablet Take 1 tablet (60 mg total) by mouth 2 (two) times daily. 10/22/19 02/11/21 Yes Herminio Commons, MD  lidocaine-prilocaine (EMLA) cream Apply 1 application topically every Monday, Wednesday, and Friday. Prior to dialysis   Yes [provider]  loratadine (CLARITIN) 10 MG tablet Take 1 tablet (10 mg total) by mouth daily as needed for allergies. 07/14/18  Yes Hosie Poisson, MD  multivitamin (RENA-VIT) TABS tablet Take 1 tablet by mouth daily.   Yes [provider]  nitroGLYCERIN (NITROSTAT) 0.4 MG SL tablet Place 1 tablet (0.4 mg total) under the tongue every 5 (five) minutes as needed for chest pain. 05/07/20  Yes Johnson, Clanford L, MD  nystatin-triamcinolone (MYCOLOG II) cream Apply 1 application topically 2 (two) times daily. Patient taking differently: Apply 1 application topically at bedtime.  02/25/20  Yes  Rehman, Mechele Dawley, MD  omeprazole (PRILOSEC) 40 MG capsule Take 1 capsule (40 mg total) by mouth daily before breakfast. 03/31/20  Yes Laurine Blazer A, PA-C  ondansetron (ZOFRAN-ODT) 4 MG disintegrating tablet Take 4 mg by mouth every 8 (eight) hours as needed for nausea or  vomiting.  03/05/19  Yes [provider]  sevelamer carbonate (RENVELA) 800 MG tablet Take 800 mg by mouth 3 (three) times daily with meals.   Yes [provider]  simvastatin (ZOCOR) 20 MG tablet TAKE 1 TABLET BY MOUTH ONCE DAILY AT BEDTIME Patient taking differently: Take 20 mg by mouth at bedtime.  07/22/19  Yes Herminio Commons, MD    Social History   Socioeconomic History  . Marital status: Married    Spouse name: [name]  . Number of children: Not on file  . Years of education: Not on file  . Highest education level: Not on file  Occupational History  . Not on file  Tobacco Use  . Smoking status: Never Smoker  . Smokeless tobacco: Never Used  Vaping Use  . Vaping Use: Never used  Substance and Sexual Activity  . Alcohol use: No    Alcohol/week: 0.0 standard drinks  . Drug use: No  . Sexual activity: Yes    Birth control/protection: Surgical  Other Topics Concern  . Not on file  Social History Narrative   Lives in [place], [place] with husband.  Dialysis pt - [facility].   Social Determinants of Health   Financial Resource Strain: Not on file  Food Insecurity: Not on file  Transportation Needs: Not on file  Physical Activity: Not on file  Stress: Not on file  Social Connections: Not on file  Intimate Partner Violence: Not on file    Family History  Problem Relation Age of Onset  . Heart disease Mother        Heart Disease before age 33  . Hyperlipidemia Mother   . Hypertension Mother   . Diabetes Mother   . Heart attack Mother   . Heart disease Father        Heart Disease before age 59  . Hyperlipidemia Father   . Hypertension Father   . Diabetes Father   . Diabetes Sister    . Hypertension Sister   . Diabetes Brother   . Hyperlipidemia Brother   . Heart attack Brother   . Hypertension Sister   . Heart attack Brother   . Colon cancer Child 28  . Other Other        noncontributory for early CAD  . Esophageal cancer Neg Hx   . Liver disease Neg Hx   . Kidney disease Neg Hx   . Colon polyps Neg Hx     ROS: Otherwise negative unless mentioned in HPI  Physical Examination  Vitals:   06/18/20 1000 06/18/20 1022  BP: (!) 105/46 (!) 90/45  Pulse:  72  Resp: 15 17  Temp:    SpO2:  100%   Body mass index is 26.74 kg/m.  General:  WDWN in NAD, seen in HD and very drowsy  Gait: Not observed HENT: WNL, normocephalic Pulmonary: normal non-labored breathing Cardiac: regular rate and rhythm Vascular Exam/Pulses: 2+ right radial pulse, 1+ left radial pulse, left hand warm. 5/5 grip strength; Left AVG with dressings. Palpable thrill. Pulsatility in the mid arm Extremities: without ischemic changes, without Gangrene , without cellulitis; without open wounds;  Musculoskeletal: no muscle wasting or atrophy  Neurologic: A&O X 3;  No focal weakness or paresthesias are detected; speech is fluent/normal Psychiatric:  The pt has Normal affect. Lymph:  Unremarkable  CBC    Component Value Date/Time   WBC 11.5 (H) 06/18/2020 0338   RBC 2.75 (L) 06/18/2020 0338   HGB 9.3 (L) 06/18/2020 8119  HCT 29.8 (L) 06/18/2020 0338   PLT 208 06/18/2020 0338   MCV 108.4 (H) 06/18/2020 0338   MCH 33.8 06/18/2020 0338   MCHC 31.2 06/18/2020 0338   RDW 22.5 (H) 06/18/2020 0338   LYMPHSABS 0.8 05/06/2020 1319   MONOABS 0.5 05/06/2020 1319   EOSABS 0.3 05/06/2020 1319   BASOSABS 0.1 05/06/2020 1319    BMET    Component Value Date/Time   NA 138 06/18/2020 0338   K 5.3 (H) 06/18/2020 0338   CL 100 06/18/2020 0338   CO2 19 (L) 06/18/2020 0338   GLUCOSE 125 (H) 06/18/2020 0338   BUN 110 (H) 06/18/2020 0338   CREATININE 13.37 (H) 06/18/2020 0338   CREATININE 6.57 (H)  03/05/2019 1159   CALCIUM 8.8 (L) 06/18/2020 0338   CALCIUM 8.1 (L) 05/29/2013 1814   GFRNONAA 3 (L) 06/18/2020 0338   GFRNONAA 6 (L) 03/05/2019 1159   GFRAA 9 (L) 02/12/2020 1712   GFRAA 6 (L) 03/05/2019 1159    COAGS: Lab Results  Component Value Date   INR 1.1 06/12/2020   INR 1.1 03/08/2019   INR 1.1 03/07/2019    Statin:  Yes.   Beta Blocker:  No. Aspirin:  Yes.   ACEI:  No. ARB:  No. CCB use:  Yes Other antiplatelets/anticoagulants:  No.    ASSESSMENT/PLAN: This is a 79 y.o. female with ESRD who presented with clotted LUE AVG. She had a declot attempted by IR which was unsuccessful. She has a functioning right EJ TDC now for dialysis. She has had multiple prior dialysis accesses before. Her right upper extremity is not a good option due to developing almost immediate severe steal symptoms. Her left has already had several failed accesses. I have ordered a vein mapping to assess her options for new access. The on call vascular surgeon Dr. Carlis Abbott will review her images and duplex and will evaluate patient later to provide further surgical management plans   Karoline Caldwell PA-C [facility] [phone] 06/18/2020  10:27 AM   I have seen and evaluated the patient. I agree with the PA note as documented above.  79 year old female admitted with thrombosed left upper arm AV graft that was placed in 2018 at [facility].  She has had multiple secondary interventions including stenting of the venous outflow.  Apparently underwent attempted percutaneous thrombectomy in IR yesterday that was unsuccessful.  On exam she does have an appreciable thrill down by the anastomosis at her antecubital fossa and I can also feel some flow in the upper arm.  I have reviewed her pictures that were performed in IR.  There does appear to be a disease segment in the mid segment of the graft from looking at the pictures.  I have recommended proceeding to the OR tomorrow for attempted either  open graft thrombectomy versus likely interposition jump graft around the diseased segment to try and salvage this.  Please keep her n.p.o. after midnight.  We discussed that there would a high chance this may fail given the duration that she is had this graft.  She does not want any access in the right arm given a history of steal.  Will need to continue using right EJ tunneled catheter for at least one month post-op as discussed with patient and her husband.  Marty Heck, MD [facility] [place] Office: [phone]

## 2020-06-18 NOTE — Consult Note (Addendum)
Cardiology Consultation:   Patient ID: Shawna [name] MRN: [id number]; DOB: 1941-02-16  Admit date: 06/12/2020 Date of Consult: 06/18/2020  Primary Care Provider: [name], MD [facility] Cardiologist: No primary care provider on file. [place] Electrophysiologist:  None    Patient Profile:   Shawna [name] is a 79 y.o. female with a hx of CAD, CABG-2013, PCI with DES to D1 10/2017, patent LIMA and grafts, HTN, HLD, PAF NSVT-on amiodarone and ESRD on HD  who is being seen today for the evaluation of chest pain at the request of Dr. McDiarmid.  History of Present Illness:   Ms. Worst with above hx and last cath 07/13/2018 with severe 3 vessel occlusive CAD but patent LIMA to LAD, patent SVG to Ramus intermediate, patent SVG to OM, patent SVG to distal RCA, good LV function and Dr. Martinique at cath noted If she has refractory angina despite good BP and volume management we could consider PCI of the PDA but this would have to be done through the SVG and in general she has not responded well to stenting in the past with all prior stents occluded.   She has PAF but not on anticoagulation due to GI bleeds.   Pt admitted 06/12/20 with occlusion of AVF, and needing dialysis.  She needed bipap, she had hypertensive emergency (BP 185/67) and pulmonary edema.   She has been dialyzed.    Has had graft with declot and thrombectomy and angioplasty but still with issues so Right E J  HD catheter placed.     EKG:  The EKG was personally reviewed and demonstrates:  I reviewed all done from 06/12/20 to 06/17/20  Telemetry:  Telemetry was personally reviewed and demonstrates:  SR  Pt has had chest pain and relief with NTG.  troponins elevated to 127, were 124 on admit.  (59, 155. 124, 127)  No EKG changes.  She is continued on amiodarone 200 mg daily for PAF and NSVT.    Labs today Na 138, K+ 5.3, BUN 110, Cr 13.37,  WBC 11.5, Hgb 9.3, plts 208  BP 146/62, P 84 R 16   She does  admit to angina at home with strenuous activity. Maybe twice a week. NTG with relief.      Past Medical History:  Diagnosis Date  . Acute on chronic respiratory failure with hypoxia ([place]) 10/10/2016  . Anxiety   . Arthritis   . AVM (arteriovenous malformation) of colon   . CAD (coronary artery disease)    a. s/p CABG in 2013 b. DES to D1 in 10/2016. c. cath in 07/2018 showing patent grafts with occlusion of D1 at prior stent site and progression of PDA disease --> medical management recommended  . Carotid artery disease ([place])    a. 76-19% LICA, 11/930   . Chronic anemia   . Chronic bronchitis ([place])   . Chronic diastolic CHF (congestive heart failure) ([place])    a. 02/2012 Echo EF 60-65%, nl wall motion, Gr 1 DD, mod MR  . Colon cancer ([place]) 1992  . Esophageal stricture   . ESRD on hemodialysis ([place])    ESRD due to HTN, started dialysis 2011 and gets HD at [facility] with Dr Hinda Lenis on MWF schedule.  Access is LUA AVF as of Sept 2014.   Marland Kitchen GERD (gastroesophageal reflux disease)   . High cholesterol 12/2011  . History of blood transfusion 07/2011; 12/2011; 01/2012 X 2; 04/2012  . History of gout   .  History of lower GI bleeding   . Hypertension   . Iron deficiency anemia   . Mitral regurgitation    a. Moderate by echo, 02/2012  . Myocardial infarction ([place])   . NSVT (nonsustained ventricular tachycardia) ([place])   . Ovarian cancer ([place]) 1992  . PAF (paroxysmal atrial fibrillation) ([place])   . Pneumonia ~ 2009  . PUD (peptic ulcer disease)   . TIA (transient ischemic attack)     Past Surgical History:  Procedure Laterality Date  . A/V SHUNTOGRAM Left 03/19/2019   Procedure: A/V SHUNTOGRAM;  Surgeon: Katha Cabal, MD;  Location: [place] CV LAB;  Service: Cardiovascular;  Laterality: Left;  . ABDOMINAL HYSTERECTOMY  1992  . APPENDECTOMY  06/1990  . AV FISTULA PLACEMENT  07/2009   left upper arm  . AV FISTULA PLACEMENT Right 09/06/2016   Procedure: RIGHT FOREARM ARTERIOVENOUS  (AV) GRAFT;  Surgeon: Elam Dutch, MD;  [facility] OR;  Service: Vascular;  Laterality: Right;  . AV FISTULA PLACEMENT N/A 02/24/2017   Procedure: INSERTION OF ARTERIOVENOUS (AV) GORE-TEX GRAFT ARM (BRACHIAL AXILLARY);  Surgeon: Katha Cabal, MD;  Location: [facility] ORS;  Service: Vascular;  Laterality: N/A;  . [place] Right 09/06/2016   Procedure: REMOVAL OF Right Arm ARTERIOVENOUS GORETEX GRAFT and Vein Patch angioplasty of brachial artery;  Surgeon: Angelia Mould, MD;  Location: [place];  Service: Vascular;  Laterality: Right;  . BIOPSY  09/26/2019   Procedure: BIOPSY;  Surgeon: Rogene Houston, MD;  [facility];  Service: Endoscopy;;  . COLON RESECTION  1992  . COLON SURGERY    . COLONOSCOPY N/A 03/09/2019   Procedure: COLONOSCOPY;  Surgeon: Rogene Houston, MD;  [facility];  Service: Endoscopy;  Laterality: N/A;  . CORONARY ANGIOPLASTY WITH STENT PLACEMENT  12/15/11   "2"  . CORONARY ANGIOPLASTY WITH STENT PLACEMENT  y/2013   "1; makes total of 3" (05/02/2012)  . CORONARY ARTERY BYPASS GRAFT  06/13/2012   Procedure: CORONARY ARTERY BYPASS GRAFTING (CABG);  Surgeon: Grace Isaac, MD;  Location: [place];  Service: Open Heart Surgery;  Laterality: N/A;  cabg x four;  using left internal mammary artery, and left leg greater saphenous vein harvested endoscopically  . CORONARY STENT INTERVENTION N/A 10/13/2016   Procedure: Coronary Stent Intervention;  Surgeon: Troy Sine, MD;  Location: [place] CV LAB;  Service: Cardiovascular;  Laterality: N/A;  . DIALYSIS/PERMA CATHETER REMOVAL N/A 04/18/2017   Procedure: DIALYSIS/PERMA CATHETER REMOVAL;  Surgeon: Katha Cabal, MD;  Location: [place] CV LAB;  Service: Cardiovascular;  Laterality: N/A;  . DILATION AND CURETTAGE OF UTERUS    . ESOPHAGOGASTRODUODENOSCOPY  01/20/2012   Procedure: ESOPHAGOGASTRODUODENOSCOPY (EGD);  Surgeon: Ladene Artist, MD,FACG;  Location: [facility] ENDOSCOPY;  Service:  Endoscopy;  Laterality: N/A;  . ESOPHAGOGASTRODUODENOSCOPY N/A 03/26/2013   Procedure: ESOPHAGOGASTRODUODENOSCOPY (EGD);  Surgeon: Irene Shipper, MD;  Location: [facility] ENDOSCOPY;  Service: Endoscopy;  Laterality: N/A;  . ESOPHAGOGASTRODUODENOSCOPY N/A 04/30/2015   Procedure: ESOPHAGOGASTRODUODENOSCOPY (EGD);  Surgeon: Rogene Houston, MD;  [facility];  Service: Endoscopy;  Laterality: N/A;  1pm - moved to 10/20 @ 1:10  . ESOPHAGOGASTRODUODENOSCOPY N/A 07/29/2016   Procedure: ESOPHAGOGASTRODUODENOSCOPY (EGD);  Surgeon: Manus Gunning, MD;  Location: [place];  Service: Gastroenterology;  Laterality: N/A;  enteroscopy  . ESOPHAGOGASTRODUODENOSCOPY N/A 09/26/2019   Procedure: ESOPHAGOGASTRODUODENOSCOPY (EGD);  Surgeon: Rogene Houston, MD;  [facility];  Service: Endoscopy;  Laterality: N/A;  1250  . GIVENS CAPSULE STUDY N/A 03/07/2019   Procedure: GIVENS CAPSULE STUDY;  Surgeon: Rogene Houston, MD;  [facility];  Service: Endoscopy;  Laterality: N/A;  7:30  . INTRAOPERATIVE TRANSESOPHAGEAL ECHOCARDIOGRAM  06/13/2012   Procedure: INTRAOPERATIVE TRANSESOPHAGEAL ECHOCARDIOGRAM;  Surgeon: Grace Isaac, MD;  Location: [place];  Service: Open Heart Surgery;  Laterality: N/A;  . IR FLUORO GUIDE CV LINE RIGHT  06/17/2020  . IR GENERIC HISTORICAL  07/26/2016   IR FLUORO GUIDE CV LINE RIGHT 07/26/2016 Greggory Keen, MD [facility]  . IR GENERIC HISTORICAL  07/26/2016   IR US GUIDE VASC ACCESS RIGHT 07/26/2016 Greggory Keen, MD [facility]  . IR GENERIC HISTORICAL  08/02/2016   IR US GUIDE VASC ACCESS RIGHT 08/02/2016 Greggory Keen, MD [facility]  . IR GENERIC HISTORICAL  08/02/2016   IR FLUORO GUIDE CV LINE RIGHT 08/02/2016 Greggory Keen, MD [facility]  . IR RADIOLOGY PERIPHERAL GUIDED IV START  03/28/2017  . IR THROMBECTOMY AV FISTULA W/THROMBOLYSIS INC/SHUNT/IMG LEFT Left 06/17/2020  . IR US GUIDE VASC ACCESS LEFT  06/17/2020  . IR US GUIDE VASC ACCESS RIGHT   03/28/2017  . IR US GUIDE VASC ACCESS RIGHT  06/17/2020  . LEFT HEART CATH AND CORONARY ANGIOGRAPHY N/A 09/20/2016   Procedure: Left Heart Cath and Coronary Angiography;  Surgeon: [name], MD;  Location: [place] CV LAB;  Service: Cardiovascular;  Laterality: N/A;  . LEFT HEART CATH AND CORS/GRAFTS ANGIOGRAPHY N/A 10/13/2016   Procedure: Left Heart Cath and Cors/Grafts Angiography;  Surgeon: Troy Sine, MD;  Location: [place] CV LAB;  Service: Cardiovascular;  Laterality: N/A;  . LEFT HEART CATH AND CORS/GRAFTS ANGIOGRAPHY N/A 07/13/2018   Procedure: LEFT HEART CATH AND CORS/GRAFTS ANGIOGRAPHY;  Surgeon: Martinique, Peter M, MD;  Location: [place] CV LAB;  Service: Cardiovascular;  Laterality: N/A;  . LEFT HEART CATHETERIZATION WITH CORONARY ANGIOGRAM N/A 12/15/2011   Procedure: LEFT HEART CATHETERIZATION WITH CORONARY ANGIOGRAM;  Surgeon: Burnell Blanks, MD;  Location: [facility] CATH LAB;  Service: Cardiovascular;  Laterality: N/A;  . LEFT HEART CATHETERIZATION WITH CORONARY ANGIOGRAM N/A 01/10/2012   Procedure: LEFT HEART CATHETERIZATION WITH CORONARY ANGIOGRAM;  Surgeon: Peter M Martinique, MD;  Location: [facility] CATH LAB;  Service: Cardiovascular;  Laterality: N/A;  . LEFT HEART CATHETERIZATION WITH CORONARY ANGIOGRAM N/A 06/08/2012   Procedure: LEFT HEART CATHETERIZATION WITH CORONARY ANGIOGRAM;  Surgeon: Burnell Blanks, MD;  Location: [facility] CATH LAB;  Service: Cardiovascular;  Laterality: N/A;  . LEFT HEART CATHETERIZATION WITH CORONARY/GRAFT ANGIOGRAM N/A 12/10/2013   Procedure: LEFT HEART CATHETERIZATION WITH Beatrix Fetters;  Surgeon: [name], MD;  Location: [name] CATH LAB;  Service: Cardiovascular;  Laterality: N/A;  . OVARY SURGERY     ovarian cancer  . POLYPECTOMY  03/09/2019   Procedure: POLYPECTOMY;  Surgeon: Rogene Houston, MD;  [facility];  Service: Endoscopy;;  cecal   . POLYPECTOMY N/A 09/26/2019   Procedure: DUODENAL POLYPECTOMY;  Surgeon:  Rogene Houston, MD;  [facility];  Service: Endoscopy;  Laterality: N/A;  . REVISION OF ARTERIOVENOUS GORETEX GRAFT N/A 02/24/2017   Procedure: REVISION OF ARTERIOVENOUS GORETEX GRAFT (RESECTION);  Surgeon: Katha Cabal, MD;  Location: [facility] ORS;  Service: Vascular;  Laterality: N/A;  Earney Mallet N/A 10/15/2013   Procedure: Fistulogram;  Surgeon: Serafina Mitchell, MD;  Location: [facility] CATH LAB;  Service: Cardiovascular;  Laterality: N/A;  . THROMBECTOMY / ARTERIOVENOUS GRAFT REVISION  2011  left upper arm  . TUBAL LIGATION  1980's  . UPPER EXTREMITY ANGIOGRAPHY Bilateral 12/06/2016   Procedure: Upper Extremity Angiography;  Surgeon: Katha Cabal, MD;  Location: [place] CV LAB;  Service: Cardiovascular;  Laterality: Bilateral;  . UPPER EXTREMITY INTERVENTION Left 06/06/2017   Procedure: UPPER EXTREMITY INTERVENTION;  Surgeon: Katha Cabal, MD;  Location: [place] CV LAB;  Service: Cardiovascular;  Laterality: Left;     Home Medications:  Prior to Admission medications   Medication Sig Start Date End Date Taking? Authorizing Provider  albuterol (VENTOLIN HFA) 108 (90 Base) MCG/ACT inhaler Inhale 2 puffs into the lungs every 6 (six) hours as needed for wheezing or shortness of breath.   Yes [provider]  ALPRAZolam Duanne Moron) 0.5 MG tablet Take 0.5 mg by mouth at bedtime as needed for anxiety.   Yes [provider]  amiodarone (PACERONE) 200 MG tablet Take 1 tablet (200 mg total) by mouth daily. Starting July 2 12/17/19  Yes Strader, Fransisco Hertz, PA-C  aspirin EC 81 MG tablet Take 81 mg by mouth daily.  09/27/19  Yes Rehman, Mechele Dawley, MD  cyclobenzaprine (FLEXERIL) 5 MG tablet Take 5 mg by mouth 2 (two) times daily.  05/08/20  Yes [provider]  dicyclomine (BENTYL) 10 MG capsule Take 10 mg by mouth 2 (two) times daily.    Yes [provider]  fluticasone (FLONASE) 50 MCG/ACT nasal spray Place 1 spray into both nostrils at  bedtime as needed for allergies.    Yes [provider]  gabapentin (NEURONTIN) 100 MG capsule Take 100 mg by mouth daily.  04/16/20  Yes [provider]  isosorbide mononitrate (IMDUR) 60 MG 24 hr tablet Take 1 tablet (60 mg total) by mouth 2 (two) times daily. 10/22/19 02/11/21 Yes Herminio Commons, MD  lidocaine-prilocaine (EMLA) cream Apply 1 application topically every Monday, Wednesday, and Friday. Prior to dialysis   Yes [provider]  loratadine (CLARITIN) 10 MG tablet Take 1 tablet (10 mg total) by mouth daily as needed for allergies. 07/14/18  Yes Hosie Poisson, MD  multivitamin (RENA-VIT) TABS tablet Take 1 tablet by mouth daily.   Yes [provider]  nitroGLYCERIN (NITROSTAT) 0.4 MG SL tablet Place 1 tablet (0.4 mg total) under the tongue every 5 (five) minutes as needed for chest pain. 05/07/20  Yes Johnson, Clanford L, MD  nystatin-triamcinolone (MYCOLOG II) cream Apply 1 application topically 2 (two) times daily. Patient taking differently: Apply 1 application topically at bedtime.  02/25/20  Yes Rehman, Mechele Dawley, MD  omeprazole (PRILOSEC) 40 MG capsule Take 1 capsule (40 mg total) by mouth daily before breakfast. 03/31/20  Yes Laurine Blazer A, PA-C  ondansetron (ZOFRAN-ODT) 4 MG disintegrating tablet Take 4 mg by mouth every 8 (eight) hours as needed for nausea or vomiting.  03/05/19  Yes [provider]  sevelamer carbonate (RENVELA) 800 MG tablet Take 800 mg by mouth 3 (three) times daily with meals.   Yes [provider]  simvastatin (ZOCOR) 20 MG tablet TAKE 1 TABLET BY MOUTH ONCE DAILY AT BEDTIME Patient taking differently: Take 20 mg by mouth at bedtime.  07/22/19  Yes Herminio Commons, MD    Inpatient Medications: Scheduled Meds: . alum & mag hydroxide-simeth  30 mL Oral Once  . amiodarone  200 mg Oral Daily  . aspirin EC  81 mg Oral Daily  . Chlorhexidine Gluconate Cloth  6 each Topical Q0600  . darbepoetin  (ARANESP) injection - DIALYSIS  60 mcg Intravenous Q Mon-HD  . dicyclomine  10 mg Oral BID  . gabapentin  100 mg Oral QHS  . isosorbide mononitrate  60 mg Oral BID  . sevelamer carbonate  1,600 mg Oral TID WC  . simvastatin  20 mg Oral QHS  . sodium chloride flush  10-40 mL Intracatheter Q12H   Continuous Infusions:  PRN Meds: loratadine, nitroGLYCERIN, sodium chloride flush  Allergies:    Allergies  Allergen Reactions  . Aspirin Other (See Comments)    High Doses Mess up her stomach; "makes my bowels have blood in them". Takes 81 mg EC Aspirin   . Penicillins Other (See Comments)    SYNCOPE? , "makes me real weak when I take it; like I'll pass out"  Has patient had a PCN reaction causing immediate rash, facial/tongue/throat swelling, SOB or lightheadedness with hypotension: Yes Has patient had a PCN reaction causing severe rash involving mucus membranes or skin necrosis: no Has patient had a PCN reaction that required hospitalization no Has patient had a PCN reaction occurring within the last 10 years: no If all of the above  . Amlodipine Swelling  . Bactrim [Sulfamethoxazole-Trimethoprim] Rash  . Contrast Media [Iodinated Diagnostic Agents] Itching  . Iron Itching and Other (See Comments)    "they gave me iron in dialysis; had to give me Benadryl cause I had to have the iron" (05/02/2012)  . Nitrofurantoin Hives  . Tylenol [Acetaminophen] Itching and Other (See Comments)    Makes her feet on fire per pt  . Gabapentin Other (See Comments)    Unknown reaction  . Hydralazine Itching  . Dexilant [Dexlansoprazole] Other (See Comments)    Upset stomach  . Levaquin [Levofloxacin In D5w] Rash  . Morphine And Related Itching and Other (See Comments)    Itching in feet  . Plavix [Clopidogrel Bisulfate] Rash  . Protonix [Pantoprazole Sodium] Rash  . Venofer [Ferric Oxide] Itching and Other (See Comments)    Patient reports using Benadryl prior to doses as [place] History:   Social History   Socioeconomic History  . Marital status: Married    Spouse name: [name]  . Number of children: Not on file  . Years of education: Not on file  . Highest education level: Not on file  Occupational History  . Not on file  Tobacco Use  . Smoking status: Never Smoker  . Smokeless tobacco: Never Used  Vaping Use  . Vaping Use: Never used  Substance and Sexual Activity  . Alcohol use: No    Alcohol/week: 0.0 standard drinks  . Drug use: No  . Sexual activity: Yes    Birth control/protection: Surgical  Other Topics Concern  . Not on file  Social History Narrative   Lives in [place], [place] with husband.  Dialysis pt - [facility].   Social Determinants of Health   Financial Resource Strain: Not on file  Food Insecurity: Not on file  Transportation Needs: Not on file  Physical Activity: Not on file  Stress: Not on file  Social Connections: Not on file  Intimate Partner Violence: Not on file    Family History:    Family History  Problem Relation Age of Onset  . Heart disease Mother        Heart Disease before age 31  . Hyperlipidemia Mother   . Hypertension Mother   . Diabetes Mother   . Heart attack Mother   . Heart disease Father  Heart Disease before age 85  . Hyperlipidemia Father   . Hypertension Father   . Diabetes Father   . Diabetes Sister   . Hypertension Sister   . Diabetes Brother   . Hyperlipidemia Brother   . Heart attack Brother   . Hypertension Sister   . Heart attack Brother   . Colon cancer Child 57  . Other Other        noncontributory for early CAD  . Esophageal cancer Neg Hx   . Liver disease Neg Hx   . Kidney disease Neg Hx   . Colon polyps Neg Hx      ROS:  Please see the history of present illness.  General:no colds or fevers, + weight increase due to lack of dialysis  Skin:no rashes or ulcers HEENT:no blurred vision, no congestion CV:see HPI PUL:see HPI GI:no diarrhea constipation or melena,  no indigestion GU:no hematuria, no dysuria MS:no joint pain, no claudication Neuro:no syncope, no lightheadedness Endo:no diabetes, no thyroid disease  All other ROS reviewed and negative.     Physical Exam/Data:   Vitals:   06/18/20 1026 06/18/20 1102 06/18/20 1104 06/18/20 1158  BP: (!) 108/43 (!) 105/39 (!) 105/39 (!) 146/62  Pulse: 71 78 (!) 58 84  Resp: 13 16 16 16   Temp: 97.8 F (36.6 C) (!) 97.5 F (36.4 C) (!) 97.5 F (36.4 C)   TempSrc: Oral Oral Oral   SpO2: 100% (!) 61% 100% 100%  Weight: 64.2 kg     Height:        Intake/Output Summary (Last 24 hours) at 06/18/2020 1545 Last data filed at 06/18/2020 1200 Gross per 24 hour  Intake 920 ml  Output 600 ml  Net 320 ml   Last 3 Weights 06/18/2020 06/18/2020 06/18/2020  Weight (lbs) 141 lb 8.6 oz 141 lb 8.6 oz 138 lb 0.1 oz  Weight (kg) 64.2 kg 64.2 kg 62.6 kg     Body mass index is 26.74 kg/m.  General:  Well nourished, well developed, in no acute distress HEENT: normal Lymph: no adenopathy Neck: no JVD Endocrine:  No thryomegaly Vascular: No carotid bruits; pedal pulses 1+ bilaterally  Cardiac:  normal S1, S2; RRR; no murmur gallup rub or click  Lungs:  clear to auscultation bilaterally, no wheezing, rhonchi or rales  Abd: soft, nontender, no hepatomegaly  Ext: no edema Musculoskeletal:  No deformities, BUE and BLE strength normal and equal Skin: warm and dry  Neuro:  A&O X 3, no focal abnormalities noted Psych:  Normal affect     Relevant CV Studies: Cardiac cath  Cardiac Catheterization: 07/2018  Ost RPDA to RPDA lesion is 75% stenosed.  Mid LM to Dist LM lesion is 60% stenosed.  Ost Ramus to Ramus lesion is 80% stenosed.  Ost Cx to Mid Cx lesion is 99% stenosed.  Prox LAD lesion is 90% stenosed.  Ost 1st Diag to 1st Diag lesion is 100% stenosed.  Ost RCA to Prox RCA lesion is 100% stenosed.  LIMA graft was visualized by angiography and is large.  The graft exhibits no disease.  SVG  graft was visualized by angiography and is large.  The graft exhibits no disease.  SVG graft was visualized by angiography and is normal in caliber.  SVG graft was visualized by angiography and is large.  The graft exhibits no disease.  The left ventricular systolic function is normal.  LV end diastolic pressure is mildly elevated.  The left ventricular ejection fraction is 55-65% by visual  estimate.  1. Severe 3 vessel occlusive CAD - 60% distal left main - 90% mid LAD - 100% first diagonal at prior stent site - 99% proximal LCx at prior stent site - 80% ramus intermediate. - 100% ostial RCA at prior stent site - 75% proximal PDA 2. Patent LIMA to the LAD 3. Patent SVG to ramus intermediate 4. Patent SVG to OM 5. Patent SVG to the distal RCA. 6. Good LV function 7. Mildly elevated LVEDP  Plan: Compared to prior cath in April 2018 the diagonal is now occluded. There is progression of disease in the PDA. It is unclear if this is the reason for her chest pain. She is severely hypertensive and EDP is mildly elevated. I would recommend medical management with good BP control. If she has refractory angina despite good BP and volume management we could consider PCI of the PDA but this would have to be done through the SVG and in general she has not responded well to stenting in the past with all prior stents occluded. I will increase her Coreg dose. She will need dialysis in am.    Diagnostic Dominance: Right    Intervention    Echocardiogram: 12/2018 IMPRESSIONS    1. The left ventricle has normal systolic function with an ejection  fraction of 60-65%. The cavity size was normal. There is mild concentric  left ventricular hypertrophy. Left ventricular diastolic Doppler  parameters are consistent with impaired  relaxation. Elevated left ventricular end-diastolic pressure No evidence  of left ventricular regional wall motion abnormalities.  2.  Left atrial size was mildly dilated.  3. The mitral valve is grossly normal. Mild thickening of the mitral  valve leaflet. There is mild mitral annular calcification present. Mitral  valve regurgitation is mild to moderate by color flow Doppler.  4. The tricuspid valve is grossly normal.  5. The aortic valve is tricuspid. Mild thickening of the aortic valve.  6. Moderate aortic annular calcification.  7. The aortic root is normal in size and structure.   Laboratory Data:  High Sensitivity Troponin:   Recent Labs  Lab 06/12/20 2136 06/13/20 0352 06/13/20 1125 06/17/20 2046  TROPONINIHS 59* 155* 124* 127*     Chemistry Recent Labs  Lab 06/16/20 0344 06/17/20 0116 06/18/20 0338  NA 138 138 138  K 4.9 4.5 5.3*  CL 101 99 100  CO2 23 21* 19*  GLUCOSE 136* 143* 125*  BUN 40* 72* 110*  CREATININE 7.76* 9.87* 13.37*  CALCIUM 9.5 9.3 8.8*  GFRNONAA 5* 4* 3*  ANIONGAP 14 18* 19*    Recent Labs  Lab 06/16/20 0344 06/17/20 0116 06/18/20 0338  ALBUMIN 2.9* 2.8* 2.8*   Hematology Recent Labs  Lab 06/15/20 0517 06/16/20 0344 06/17/20 1941 06/18/20 0338  WBC 5.2 7.8  --  11.5*  RBC 2.39* 2.88*  --  2.75*  HGB 8.1* 9.8* 10.5* 9.3*  HCT 25.9* 31.2* 33.6* 29.8*  MCV 108.4* 108.3*  --  108.4*  MCH 33.9 34.0  --  33.8  MCHC 31.3 31.4  --  31.2  RDW 24.1* 23.2*  --  22.5*  PLT 157 190  --  208   BNPNo results for input(s): BNP, PROBNP in the last 168 hours.  DDimer No results for input(s): DDIMER in the last 168 hours.   Radiology/Studies:  IR Fluoro Guide CV Line Right  Result Date: 06/18/2020 CLINICAL DATA:  Failed attempt at percutaneous declot of an occluded left upper arm dialysis graft. Current left IJ temporary  non tunneled dialysis catheter in place. EXAM: TUNNELED CENTRAL VENOUS HEMODIALYSIS CATHETER PLACEMENT WITH ULTRASOUND AND FLUOROSCOPIC GUIDANCE ANESTHESIA/SEDATION: 1.0 mg IV Versed; 25 mcg IV Fentanyl. Total Moderate Sedation Time:   32 minutes.  The patient's level of consciousness and physiologic status were continuously monitored during the procedure by Radiology nursing. MEDICATIONS: 1 g IV vancomycin. FLUOROSCOPY TIME:  1.0 minute.  3.4 mGy. PROCEDURE: The procedure, risks, benefits, and alternatives were explained to the patient's husband. Questions regarding the procedure were encouraged and answered. The patient's husband understands and consents to the procedure. A timeout was performed prior to initiating the procedure. The right neck and chest were prepped with chlorhexidine in a sterile fashion, and a sterile drape was applied covering the operative field. Maximum barrier sterile technique with sterile gowns and gloves were used for the procedure. Local anesthesia was provided with 1% lidocaine. Ultrasound was used to evaluate patency of right internal and external jugular veins. After creating a small venotomy incision, a 21 gauge needle was advanced into the right external jugular vein under direct, real-time ultrasound guidance. Ultrasound image documentation was performed. After securing guidewire access, an 8 Fr dilator was placed. A J-wire was kinked to measure appropriate catheter length. A Palindrome tunneled hemodialysis catheter measuring 19 cm from tip to cuff was chosen for placement. This was tunneled in a retrograde fashion from the chest wall to the venotomy incision. At the venotomy, serial dilatation was performed and a 15 Fr peel-away sheath was placed over a guidewire. The catheter was then placed through the sheath and the sheath removed. Final catheter positioning was confirmed and documented with a fluoroscopic spot image. The catheter was aspirated, flushed with saline, and injected with appropriate volume heparin dwells. The venotomy incision was closed with subcuticular 4-0 Vicryl. Dermabond was applied to the incision. The catheter exit site was secured with 0-Prolene retention sutures. Following tunnel catheter  placement, the left jugular non tunneled temporary hemodialysis catheter was removed and manual compression applied over the catheter exit site. COMPLICATIONS: None.  No pneumothorax. 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 There is a patent external jugular vein present which empties directly into the medial subclavian vein. The medial aspect of the external jugular vein near its juncture with the subclavian vein was accessed for catheter placement. After new tunneled catheter placement, the tip lies in the right atrium. The catheter aspirates normally and is ready for immediate use. IMPRESSION: Placement of tunneled hemodialysis catheter via the right external jugular vein. The catheter tip lies in the right atrium. The catheter is ready for immediate use. After placement, the non tunneled left jugular temporary dialysis catheter was removed. Electronically Signed   By: Aletta Edouard M.D.   On: 06/18/2020 15:21   IR US Guide Vasc Access Left  Result Date: 06/18/2020 CLINICAL DATA:  Occluded left upper arm brachial to axillary dialysis graft. EXAM: 1. ULTRASOUND GUIDANCE FOR VASCULAR ACCESS OF LEFT UPPER ARM DIALYSIS GRAFT. 2. ABORTED AND PARTIALLY COMPLETED DIALYSIS FISTULA DECLOT PROCEDURE WITH TWO SEPARATE GRAFT ACCESS SITES. 3. VENOUS ANGIOPLASTY OF DIALYSIS GRAFT AND VENOUS OUTFLOW. ANESTHESIA/SEDATION: Moderate (conscious) sedation was employed during this procedure. A total of Versed 2.0 mg and Fentanyl 125 mcg was administered intravenously. Moderate Sedation Time: 108 minutes. The patient's level of consciousness and vital signs were monitored continuously by radiology nursing throughout the procedure under my direct supervision. CONTRAST:  85mL OMNIPAQUE IOHEXOL 300 MG/ML  SOLN MEDICATIONS: 2 mg tPA, 4000 U IV heparin FLUOROSCOPY TIME:  9  minutes and 18 seconds.  15.5 mGy. PROCEDURE: The procedure, risks, benefits, and alternatives were explained to the patient. Questions  regarding the procedure were encouraged and answered. The patient understands and consents to the procedure. A time-out was performed prior to initiating the procedure. Ultrasound interrogation of the left arm dialysis graft was performed. The left upper arm dialysis graft was prepped with chlorhexidine in a sterile fashion, and a sterile drape was applied covering the operative field. A sterile gown and sterile gloves were used for the procedure. Local anesthesia was provided with 1% Lidocaine. Preliminary ultrasound was performed of the dialysis fistula. Ultrasound-guided antegrade graft access was performed with a micropuncture set. Ultrasound image documentation was performed. 1 mg of t-PA was instilled. A 6 French sheath was placed over a guidewire. A diagnostic catheter was advanced and contrast injection performed at the level of patent venous outflow. Outflow venography was also performed via the catheter with visualization of central venous outflow. Angiojet thrombectomy was performed throughout the left upper arm graft and at the level of a venous outflow stent. Balloon angioplasty was performed at the level of the venous outflow stent and within the dialysis graft with a 7 mm x 4 cm Conquest balloon. Retrograde access of the dialysis graft was then performed with a micropuncture set under ultrasound guidance. A 6 French sheath was placed. Thrombectomy across the arterial anastomosis and within the proximal graft was then performed with a 4-French Fogarty balloon catheter. Several passes were made with the Fogarty catheter. Suction thrombectomy was then performed through both sheaths. Graft patency was reassessed with angiography. This was followed by additional Angiojet thrombectomy in both antegrade and retrograde directions and additional balloon angioplasty with the 7 mm balloon. Ultimately, the procedure was aborted and both sheaths removed with application Ethilon pursestring sutures. COMPLICATIONS:  None FINDINGS: Ultrasound confirms thrombosis of the graft. The arterial anastomosis appeared patent by ultrasound. Just beyond the arterial anastomosis, there is dilatation of the proximal graft followed by thrombus causing occlusion of the graft. After graft access, there also is visualization of an elongated stent which extends from the distal graft, across the venous anastomosis and into the outflow axillary vein. The entire stent is occluded and thrombus extends into the axillary vein beyond the stent. After initial mechanical thrombectomy and Fogarty thrombectomy, the proximal aspect of the dialysis graft demonstrated normal patency including the arterial anastomosis and approximately half of the dialysis graft. The lumen of the indwelling venous outflow stent was also able to be dilated. However, there was an area of resistant stenosis within the midportion of the graft that could be dilated but demonstrated persistent collapse and recurrent occlusion suggestive of focally deteriorated graft material. It was not felt that a covered stent could be placed in this region as this is currently a segment that the graft would need to be accessed for dialysis access. At this point during the procedure, it was decided to abort further attempt at percutaneous intervention and proceed with placement of a tunneled dialysis catheter for immediate dialysis needs. The patient will need eventual surgical revision of the dialysis graft versus new access. IMPRESSION: Lack of ability to maintain patency of an occluded left upper arm brachial to axillary vein dialysis graft primarily due to what appears to be a deteriorated and resistant segment within the midportion of the graft. An indwelling stent is present from the region of the distal graft into the outflow axillary vein. ACCESS: Due to inability to maintain patency of the occluded dialysis  graft, a tunneled dialysis catheter was placed following the procedure. This  procedure is dictated separately. It is not felt that repeat attempt at percutaneous declot would be fruitful. Recommend vascular surgical consultation for consideration of surgical revision of the current access versus placement of new access. Electronically Signed   By: Aletta Edouard M.D.   On: 06/18/2020 15:16   IR US Guide Vasc Access Right  Result Date: 06/18/2020 CLINICAL DATA:  Failed attempt at percutaneous declot of an occluded left upper arm dialysis graft. Current left IJ temporary non tunneled dialysis catheter in place. EXAM: TUNNELED CENTRAL VENOUS HEMODIALYSIS CATHETER PLACEMENT WITH ULTRASOUND AND FLUOROSCOPIC GUIDANCE ANESTHESIA/SEDATION: 1.0 mg IV Versed; 25 mcg IV Fentanyl. Total Moderate Sedation Time:   32 minutes. The patient's level of consciousness and physiologic status were continuously monitored during the procedure by Radiology nursing. MEDICATIONS: 1 g IV vancomycin. FLUOROSCOPY TIME:  1.0 minute.  3.4 mGy. PROCEDURE: The procedure, risks, benefits, and alternatives were explained to the patient's husband. Questions regarding the procedure were encouraged and answered. The patient's husband understands and consents to the procedure. A timeout was performed prior to initiating the procedure. The right neck and chest were prepped with chlorhexidine in a sterile fashion, and a sterile drape was applied covering the operative field. Maximum barrier sterile technique with sterile gowns and gloves were used for the procedure. Local anesthesia was provided with 1% lidocaine. Ultrasound was used to evaluate patency of right internal and external jugular veins. After creating a small venotomy incision, a 21 gauge needle was advanced into the right external jugular vein under direct, real-time ultrasound guidance. Ultrasound image documentation was performed. After securing guidewire access, an 8 Fr dilator was placed. A J-wire was kinked to measure appropriate catheter length. A Palindrome  tunneled hemodialysis catheter measuring 19 cm from tip to cuff was chosen for placement. This was tunneled in a retrograde fashion from the chest wall to the venotomy incision. At the venotomy, serial dilatation was performed and a 15 Fr peel-away sheath was placed over a guidewire. The catheter was then placed through the sheath and the sheath removed. Final catheter positioning was confirmed and documented with a fluoroscopic spot image. The catheter was aspirated, flushed with saline, and injected with appropriate volume heparin dwells. The venotomy incision was closed with subcuticular 4-0 Vicryl. Dermabond was applied to the incision. The catheter exit site was secured with 0-Prolene retention sutures. Following tunnel catheter placement, the left jugular non tunneled temporary hemodialysis catheter was removed and manual compression applied over the catheter exit site. COMPLICATIONS: None.  No pneumothorax. 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 There is a patent external jugular vein present which empties directly into the medial subclavian vein. The medial aspect of the external jugular vein near its juncture with the subclavian vein was accessed for catheter placement. After new tunneled catheter placement, the tip lies in the right atrium. The catheter aspirates normally and is ready for immediate use. IMPRESSION: Placement of tunneled hemodialysis catheter via the right external jugular vein. The catheter tip lies in the right atrium. The catheter is ready for immediate use. After placement, the non tunneled left jugular temporary dialysis catheter was removed. Electronically Signed   By: Aletta Edouard M.D.   On: 06/18/2020 15:21   IR THROMBECTOMY AV FISTULA W/THROMBOLYSIS INC/SHUNT/IMG LEFT  Result Date: 06/18/2020 CLINICAL DATA:  Occluded left upper arm brachial to axillary dialysis graft. EXAM: 1. ULTRASOUND GUIDANCE FOR VASCULAR ACCESS OF LEFT UPPER ARM DIALYSIS  GRAFT. 2. ABORTED AND PARTIALLY COMPLETED DIALYSIS FISTULA DECLOT PROCEDURE WITH TWO SEPARATE GRAFT ACCESS SITES. 3. VENOUS ANGIOPLASTY OF DIALYSIS GRAFT AND VENOUS OUTFLOW. ANESTHESIA/SEDATION: Moderate (conscious) sedation was employed during this procedure. A total of Versed 2.0 mg and Fentanyl 125 mcg was administered intravenously. Moderate Sedation Time: 108 minutes. The patient's level of consciousness and vital signs were monitored continuously by radiology nursing throughout the procedure under my direct supervision. CONTRAST:  49mL OMNIPAQUE IOHEXOL 300 MG/ML  SOLN MEDICATIONS: 2 mg tPA, 4000 U IV heparin FLUOROSCOPY TIME:  9 minutes and 18 seconds.  15.5 mGy. PROCEDURE: The procedure, risks, benefits, and alternatives were explained to the patient. Questions regarding the procedure were encouraged and answered. The patient understands and consents to the procedure. A time-out was performed prior to initiating the procedure. Ultrasound interrogation of the left arm dialysis graft was performed. The left upper arm dialysis graft was prepped with chlorhexidine in a sterile fashion, and a sterile drape was applied covering the operative field. A sterile gown and sterile gloves were used for the procedure. Local anesthesia was provided with 1% Lidocaine. Preliminary ultrasound was performed of the dialysis fistula. Ultrasound-guided antegrade graft access was performed with a micropuncture set. Ultrasound image documentation was performed. 1 mg of t-PA was instilled. A 6 French sheath was placed over a guidewire. A diagnostic catheter was advanced and contrast injection performed at the level of patent venous outflow. Outflow venography was also performed via the catheter with visualization of central venous outflow. Angiojet thrombectomy was performed throughout the left upper arm graft and at the level of a venous outflow stent. Balloon angioplasty was performed at the level of the venous outflow stent and  within the dialysis graft with a 7 mm x 4 cm Conquest balloon. Retrograde access of the dialysis graft was then performed with a micropuncture set under ultrasound guidance. A 6 French sheath was placed. Thrombectomy across the arterial anastomosis and within the proximal graft was then performed with a 4-French Fogarty balloon catheter. Several passes were made with the Fogarty catheter. Suction thrombectomy was then performed through both sheaths. Graft patency was reassessed with angiography. This was followed by additional Angiojet thrombectomy in both antegrade and retrograde directions and additional balloon angioplasty with the 7 mm balloon. Ultimately, the procedure was aborted and both sheaths removed with application Ethilon pursestring sutures. COMPLICATIONS: None FINDINGS: Ultrasound confirms thrombosis of the graft. The arterial anastomosis appeared patent by ultrasound. Just beyond the arterial anastomosis, there is dilatation of the proximal graft followed by thrombus causing occlusion of the graft. After graft access, there also is visualization of an elongated stent which extends from the distal graft, across the venous anastomosis and into the outflow axillary vein. The entire stent is occluded and thrombus extends into the axillary vein beyond the stent. After initial mechanical thrombectomy and Fogarty thrombectomy, the proximal aspect of the dialysis graft demonstrated normal patency including the arterial anastomosis and approximately half of the dialysis graft. The lumen of the indwelling venous outflow stent was also able to be dilated. However, there was an area of resistant stenosis within the midportion of the graft that could be dilated but demonstrated persistent collapse and recurrent occlusion suggestive of focally deteriorated graft material. It was not felt that a covered stent could be placed in this region as this is currently a segment that the graft would need to be accessed for  dialysis access. At this point during the procedure, it was decided to abort further attempt at  percutaneous intervention and proceed with placement of a tunneled dialysis catheter for immediate dialysis needs. The patient will need eventual surgical revision of the dialysis graft versus new access. IMPRESSION: Lack of ability to maintain patency of an occluded left upper arm brachial to axillary vein dialysis graft primarily due to what appears to be a deteriorated and resistant segment within the midportion of the graft. An indwelling stent is present from the region of the distal graft into the outflow axillary vein. ACCESS: Due to inability to maintain patency of the occluded dialysis graft, a tunneled dialysis catheter was placed following the procedure. This procedure is dictated separately. It is not felt that repeat attempt at percutaneous declot would be fruitful. Recommend vascular surgical consultation for consideration of surgical revision of the current access versus placement of new access. Electronically Signed   By: Aletta Edouard M.D.   On: 06/18/2020 15:16     Assessment and Plan:   1. Chest pain last night, relief with NTG. with elevated troponins but flat. With last cath her grafts were patent and she had disease of PDA that in combination of uncontrolled HTN, Pulmonary edema troponins would elevate.  No plan for PCI unless BP controlled with chest pain.  She has not responded well in the past to stents with all prior stents occluded.  Would decrease imdur to once daily and add lopressor 25 BID - hopefully will not cause hypotension with dialysis.  And use NTG prn 2. CAD with CABG and patent stents on last cath 07/2018.  Continue medical therapy. 3. HTN uncontrolled, BP elevated due to AV graft dysfunction and unable to dialyze as needed.  Now improved.   4. Pulmonary edema improved after dialysis.  5. ESRD on HD MWF  Had tunneled HD cath placed today through IR.  6. Anemia and has  rec'd blood transfusion.       HEAR Score (for undifferentiated chest pain):    5  New York Heart Association (NYHA) Functional Class NYHA Class III        For questions or updates, please contact [facility] Please consult [email or web address] for contact info under    Signed, Cecilie Kicks, NP  06/18/2020 3:45 PM   Attending Note:   The patient was seen and examined.  Agree with assessment and plan as noted above.  Changes made to the above note as needed.  Patient seen and independently examined with Cecilie Kicks, NP .   We discussed all aspects of the encounter. I agree with the assessment and plan as stated above.  1.   CAD :  Has extensive CAD.  Has had multiple stents - all of which have closed,   Is s/p CABG. No has angina when her BP is elevated when exerting helself She has ESRD and is on HD.  Has difficulty with BP dropping with dialysis She may actually do better on a beta block than the high dose Imdur  Will start metoprolol 25 BID  Reduce imdur to 60 ,  Will make sure she has PRN SL NTG to take for CP   She has been evaluated by Dr. Martinique in the past who notes that she occludes her stents fairly quickly and recommends medical therapy for her angina    2.   ESRD:  Complicated by a clotted dialysis . Further plans per nephrology   3.  Nonsustained VT :  On amiodarone   4. HTN:   For patients on ESRD, I always defer to nephrology  for BP control.  I would suggest that we add a low dose beta blocker that will help ease her angina and not cause so much of a BP drop with dialysis.  We have added metoprolol 25 mg  bid      I have spent a total of 40 minutes with patient reviewing hospital  notes , telemetry, EKGs, labs and examining patient as well as establishing an assessment and plan that was discussed with the patient. > 50% of time was spent in direct patient care.    Thayer Headings, Brooke Bonito., MD, [facility] 06/18/2020, 4:39 PM [address],  [place] Pager [phone]

## 2020-06-18 NOTE — Progress Notes (Signed)
[facility] NEPHROLOGY PROGRESS NOTE  Assessment/ Plan: Pt is a 79 y.o. yo female ESRD on HD, CAD CABG admitted with clotted AV fistula, shortness of breath and hypertensive urgency.  OP HD: [name]   3.5h  300/600  61kg  2/2.5 bath  LUE AVF  Hep none  - hect 2.5 ug tiw  - epo 8000 IV tiw   #SOB/ hypertensive urgency/ pulm edema: Improved after dialysis.  Currently in room air and BP controlled.  UF during HD.  # Clotted LUE AVF: Had thrombectomy and balloon dilatation of AV fistula however unable to maintain graft patency due to persistent occlusion in mid graft.  Right IJ TDC was placed.  I consulted [facility] to look at it.  IR consulted for declot vs TDC placement.  The IR procedure has been postponed multiple times, hopefully she will get it done today.   #ESRD- usual HD MWF.  Receiving dialysis today off schedule.  Tolerating well.  Plan for regular HD tomorrow, this can be done outpatient if discharged from hospital.  # Chest pain: better. Prob due to HTN/ volume overload.  Per primary team.  #Hypertension/volume: Did not get a.m. medication because of n.p.o.  Monitor BP.  UF as tolerated.  # Anemia: Received blood transfusion.  Continue aranesp on HD.  #Secondary hyperparathyroidism: Renvela dose increased, monitor phosphorus level.  Subjective: Seen and examined.  Unable to receive dialysis yesterday therefore HD today off schedule.  Had IR procedure yesterday.  Denies nausea vomiting chest pain shortness of breath.   objective Vital signs in last 24 hours: Vitals:   06/18/20 0802 06/18/20 0830 06/18/20 0900 06/18/20 0930  BP: 102/63 (!) 109/55 (!) 81/47 (!) 101/41  Pulse: 78 80 (!) 59 89  Resp: 15 12 18 14   Temp:      TempSrc:      SpO2: 100% 100% 100% 95%  Weight:      Height:       Weight change: -0.904 kg  Intake/Output Summary (Last 24 hours) at 06/18/2020 0949 Last data filed at 06/18/2020 0500 Gross per 24 hour  Intake 680 ml  Output  0 ml  Net 680 ml       Labs: Basic Metabolic Panel: Recent Labs  Lab 06/16/20 0344 06/17/20 0116 06/18/20 0338  NA 138 138 138  K 4.9 4.5 5.3*  CL 101 99 100  CO2 23 21* 19*  GLUCOSE 136* 143* 125*  BUN 40* 72* 110*  CREATININE 7.76* 9.87* 13.37*  CALCIUM 9.5 9.3 8.8*  PHOS 5.4* 6.3* 7.9*   Liver Function Tests: Recent Labs  Lab 06/16/20 0344 06/17/20 0116 06/18/20 0338  ALBUMIN 2.9* 2.8* 2.8*   No results for input(s): LIPASE, AMYLASE in the last 168 hours. No results for input(s): AMMONIA in the last 168 hours. CBC: Recent Labs  Lab 06/13/20 0956 06/14/20 0154 06/14/20 0542 06/15/20 0517 06/16/20 0344 06/17/20 1941 06/18/20 0338  WBC 9.7 5.4  --  5.2 7.8  --  11.5*  HGB 9.7* 7.1*   < > 8.1* 9.8* 10.5* 9.3*  HCT 31.0* 21.6*   < > 25.9* 31.2* 33.6* 29.8*  MCV 112.3* 109.6*  --  108.4* 108.3*  --  108.4*  PLT 206 111*  --  157 190  --  208   < > = values in this interval not displayed.   Cardiac Enzymes: No results for input(s): CKTOTAL, CKMB, CKMBINDEX, TROPONINI in the last 168 hours. CBG: Recent Labs  Lab 06/13/20 1123  GLUCAP 138*    Iron Studies: No results for input(s): IRON, TIBC, TRANSFERRIN, FERRITIN in the last 72 hours. Studies/Results: No results found.  Medications: Infusions:   Scheduled Medications: . alum & mag hydroxide-simeth  30 mL Oral Once  . amiodarone  200 mg Oral Daily  . aspirin EC  81 mg Oral Daily  . Chlorhexidine Gluconate Cloth  6 each Topical Q0600  . darbepoetin (ARANESP) injection - DIALYSIS  60 mcg Intravenous Q Mon-HD  . dicyclomine  10 mg Oral BID  . gabapentin  100 mg Oral QHS  . hydrOXYzine      . isosorbide mononitrate  60 mg Oral BID  . sevelamer carbonate  1,600 mg Oral TID WC  . simvastatin  20 mg Oral QHS  . sodium chloride flush  10-40 mL Intracatheter Q12H    have reviewed scheduled and prn medications.  Physical Exam: General:NAD, comfortable Heart:RRR, s1s2 nl Lungs: Clear b/l, no  crackle Abdomen:soft, Non-tender, non-distended Extremities:No edema Dialysis Access: Left upper extremity AV access site has dressing applied, right IJ TDC placed. Aleece Loyd Prasad Keturah Yerby 06/18/2020,9:49 AM  LOS: 4 days  Pager: [phone]

## 2020-06-18 NOTE — Progress Notes (Signed)
Family Medicine Teaching Service Daily Progress Note Intern Pager: [phone]  Patient name: Shawna [name] Medical record number: [id number] Date of birth: 08/09/1940 Age: 79 y.o. Gender: female  Primary Care Provider: [name], MD Consultants:nephro, Vascular, IR Code Status:Full  Pt Overview and Major Events to Date: 12/4 - Admitted to [place], HD 12/8- Aborted dialysis graft declot procedure with thrombectomy and angioplasty  Assessment and Plan: Cornelius a 79 y.o.femalepresenting with Clotted AVF. PMH is significant forESRD on HD(MWF), HTN, HLD, GERD, CADs/p CABG in 2013 and chronic occlusion of D1 stent and PDA (75% occlusion on 1/20) with patent grafts, Afibnot on anticoagulation due to prior GI bleed, Anxiety, Anemia, and Chronic GI bleed.  Acute Asymptomatic Anemia Anemia of Chronic Kidney Disease: Pt denies any bleeding in stool or black tarry stools.Denies any complaints.Hgb9.3 today. Baseline 8-9.Ptreceived1units pf PRBC'sat this admission.FOBT negative on 12/4.Pt had a history of GI bleed in the past. Had angiodysplasia. -Aranesp per nephrology with HD. - Q4 vitals  -Continue to monitor daily CBC's.  -Consult GI if Hbdropsagain.  Hypertensive Emergency2/35missed HDdue to Clotted AVF Essential HTN BP's initially with systolic of 979'G. S/p temp HD catheter placement and urgent HD on 12/4and also on 06/15/20. Morning BP is better at 134/50 mmHg.Vascular surgery completed procedure dialysis graft declot procedure with thrombectomy and angioplasty but it was aborted and unable to maintain graft patency due to persistent occlusion in segment of the mid graft.  Right E J  HD catheter placed and left neck temporary catheter removed. - continue HD per nephrology - monitor BP's  - Vascular consulted, appreciate recs,  -Keep oxygen saturation more than 92%  Acute hypoxic respiratory failure,resolved pt is sating at 100% at Room air. Pt didn't  use CPAP at night.Per nephrology note, hx of acute respiratory failure requiring intubation for similar presentation.  -Continue cardiac monitoring -EKG as needed -Strict I/O encourage using BIPAP at Night.  Chest tightness Elevated troponin: resolved Pt denies any chest pain today. Patient complained of chest pain yesterday night was given nitro.  EKG unchanged from previous.  Troponins mildly elevated to 127.  The last troponin was 124 on 12/4/202.  GI cocktail also ordered.  But patient refused. H/oCAD s/p CABG 2013, DES 2019.  She follows with cardiology and last seen August 2021.Home meds includesaspirin 81 mg daily and Imdur 60 mg 2 times daily. We'll call Cardiology today.  - Cardiology consult today. - Holdhome aspirin. -Continue home Imdur  -Repeat EKG if recurs -Nitroglycerin sublingual 0.4 mg as needed for chest tightness -Continue to monitor for chest pain.  ESRD on HD Pt is getting her HD today. Today creatinine is13.37 and BUN is 110. Pt is on HD (MWF)Pt hadHD on 12/4 and 12/6,12/8 and 12/9. -Nephrology Consulted. Appreciate recommendations and management. -Avoid nephrotoxic meds -Daily RFP -Continue Renvela.  HLD Home meds:simvastatin 20 mg daily at bedtime. Lipid panel 12/4: WNL -Continue home simvastatin.  GERD Shedenies any strictreflux.Home medsinclude Prilosec 40 mg. Protonix not started due to allergy. -Hold Prilosec  Chronic Diastolic HF Last echo in June 2020 showed normalLVsystolic function of 60 to 65% with mild LVhypertrophy and impaired relaxation left ventricle and low normal systolic function of right ventricle, left atrium mildly dilated.  -HTN control as above   Atrial fibrillation  TSH Not on anticoagulation due to hx of GIB.Home meds:amiodarone 200 mg daily.  -Continue amiodarone 200 mg daily -Continue cardiac monitoring  Anxiety Disorder Home meds:Xanax 0.5 mg at bedtime as needed for anxiety.  -Continue  Xanax As needed for sleep.  Hx of GI bleed from colonic AVM's Pt has history of small bowel ischemic injury and ileo-colonic anastomosis few years ago. Last colonoscopy on 02/2019 showed Diverticulosis in colon, Patent end-to-side ileo-colonic anastomosis, Two small polyps in the proximal ascending colon and One large polyp in the proximal transverse colon which were removed. FOBT negative on 12/4. This morning Ptdeniesany bleeding in stool or deniesblack tarry stool.  -Continue to monitor for bleeding. -Continue Aspirin  FEN/GI: Renal Diet Prophylaxis: SCD's.   Disposition: Med telemetry observation Pt is from:Home Planned Disposition- To be determined. Expected Discharge date 06/19/20 Pt is medically stable for discharge- No  Subjective:  Patient states she had chest pain last night and early morning but denies it now.Patient denies any shortness of breath, headache, dizziness, blurry vision, abdominal pain, nausea and vomiting.  Objective: Temp:  [97.6 F (36.4 C)-97.8 F (36.6 C)] 97.6 F (36.4 C) (12/09 0419) Pulse Rate:  [62-80] 62 (12/09 0419) Resp:  [14-20] 19 (12/09 0419) BP: (112-184)/(42-119) 119/42 (12/09 0419) SpO2:  [93 %-100 %] 95 % (12/09 0419) Weight:  [62.6 kg] 62.6 kg (12/09 0431) Physical Exam: General: Patient lying in bed comfortably and getting HD.  Not in acute distress Cardiovascular: RRR, no murmurs, rubs, gallops Respiratory: Clear to auscultation bilaterally.no wheezes, rales, rhonchi. Abdomen: Soft, nontender Extremities: Bilateral extremity pulses strong. No edema present.  Laboratory: Recent Labs  Lab 06/15/20 0517 06/16/20 0344 06/17/20 1941 06/18/20 0338  WBC 5.2 7.8  --  11.5*  HGB 8.1* 9.8* 10.5* 9.3*  HCT 25.9* 31.2* 33.6* 29.8*  PLT 157 190  --  208   Recent Labs  Lab 06/16/20 0344 06/17/20 0116 06/18/20 0338  NA 138 138 138  K 4.9 4.5 5.3*  CL 101 99 100  CO2 23 21* 19*  BUN 40* 72* 110*  CREATININE 7.76* 9.87*  13.37*  CALCIUM 9.5 9.3 8.8*  GLUCOSE 136* 143* 125*      Imaging/Diagnostic Tests: No new Results  Armando Reichert, MD 06/18/2020, 6:56 AM PGY-1, [place] Intern pager: [phone], text pages welcome

## 2020-06-19 ENCOUNTER — Encounter (HOSPITAL_COMMUNITY)
Admission: EM | Disposition: A | Discharge status: Home Health | Payer: Self-pay | Source: Home / Self Care | Attending: Family Medicine

## 2020-06-19 ENCOUNTER — Encounter (HOSPITAL_COMMUNITY): Payer: Self-pay | Admitting: Family Medicine

## 2020-06-19 ENCOUNTER — Inpatient Hospital Stay (HOSPITAL_COMMUNITY): Payer: Medicare HMO | Admitting: Certified Registered Nurse Anesthetist

## 2020-06-19 DIAGNOSIS — T82868A Thrombosis of vascular prosthetic devices, implants and grafts, initial encounter: Secondary | ICD-10-CM

## 2020-06-19 DIAGNOSIS — I130 Hypertensive heart and chronic kidney disease with heart failure and stage 1 through stage 4 chronic kidney disease, or unspecified chronic kidney disease: Secondary | ICD-10-CM

## 2020-06-19 HISTORY — PX: REVISON OF ARTERIOVENOUS FISTULA: SHX6074

## 2020-06-19 LAB — GLUCOSE, CAPILLARY
Glucose-Capillary: 81 mg/dL (ref 70–99)
Glucose-Capillary: 111 mg/dL — ABNORMAL HIGH (ref 70–99)

## 2020-06-19 LAB — CBC
HCT: 28.6 % — ABNORMAL LOW (ref 36.0–46.0)
Hemoglobin: 9 g/dL — ABNORMAL LOW (ref 12.0–15.0)
MCH: 34.4 pg — ABNORMAL HIGH (ref 26.0–34.0)
MCHC: 31.5 g/dL (ref 30.0–36.0)
MCV: 109.2 fL — ABNORMAL HIGH (ref 80.0–100.0)
Platelets: 176 10*3/uL (ref 150–400)
RBC: 2.62 MIL/uL — ABNORMAL LOW (ref 3.87–5.11)
RDW: 22.4 % — ABNORMAL HIGH (ref 11.5–15.5)
WBC: 9.2 10*3/uL (ref 4.0–10.5)
nRBC: 0.7 % — ABNORMAL HIGH (ref 0.0–0.2)

## 2020-06-19 LAB — BASIC METABOLIC PANEL
Anion gap: 15 (ref 5–15)
BUN: 51 mg/dL — ABNORMAL HIGH (ref 8–23)
CO2: 27 mmol/L (ref 22–32)
Calcium: 8.4 mg/dL — ABNORMAL LOW (ref 8.9–10.3)
Chloride: 93 mmol/L — ABNORMAL LOW (ref 98–111)
Creatinine, Ser: 7.44 mg/dL — ABNORMAL HIGH (ref 0.44–1.00)
GFR, Estimated: 5 mL/min — ABNORMAL LOW (ref >60)
Glucose, Bld: 114 mg/dL — ABNORMAL HIGH (ref 70–99)
Potassium: 3.6 mmol/L (ref 3.5–5.1)
Sodium: 135 mmol/L (ref 135–145)

## 2020-06-19 LAB — SURGICAL PCR SCREEN
MRSA, PCR: NEGATIVE
Staphylococcus aureus: NEGATIVE

## 2020-06-19 SURGERY — REVISON OF ARTERIOVENOUS FISTULA
Anesthesia: Monitor Anesthesia Care | Site: Arm Upper | Laterality: Left

## 2020-06-19 MED ORDER — ONDANSETRON HCL 4 MG/2ML IJ SOLN
INTRAMUSCULAR | Status: DC | PRN
  Administered 2020-06-19: 4 mg via INTRAVENOUS

## 2020-06-19 MED ORDER — FENTANYL CITRATE (PF) 100 MCG/2ML IJ SOLN
INTRAMUSCULAR | Status: DC | PRN
  Administered 2020-06-19 (×2): 50 ug via INTRAVENOUS

## 2020-06-19 MED ORDER — FENTANYL CITRATE (PF) 250 MCG/5ML IJ SOLN
INTRAMUSCULAR | Status: AC
  Filled 2020-06-19: qty 5

## 2020-06-19 MED ORDER — LIDOCAINE-EPINEPHRINE (PF) 1.5 %-1:200000 IJ SOLN
INTRAMUSCULAR | Status: DC | PRN
Start: 2020-06-19 — End: 2020-06-19
  Administered 2020-06-19: 15 mL via PERINEURAL

## 2020-06-19 MED ORDER — FENTANYL CITRATE (PF) 100 MCG/2ML IJ SOLN: 25.0000 ug | INTRAMUSCULAR | Status: DC | PRN

## 2020-06-19 MED ORDER — LIDOCAINE HCL (PF) 1 % IJ SOLN
INTRAMUSCULAR | Status: DC | PRN
  Administered 2020-06-19: 10 mL

## 2020-06-19 MED ORDER — HEPARIN SODIUM (PORCINE) 1000 UNIT/ML IJ SOLN
1000.0000 [IU] | INTRAMUSCULAR | Status: DC | PRN
  Filled 2020-06-19: qty 1

## 2020-06-19 MED ORDER — MIDAZOLAM HCL 2 MG/2ML IJ SOLN
INTRAMUSCULAR | Status: AC
  Filled 2020-06-19: qty 2

## 2020-06-19 MED ORDER — HEPARIN SODIUM (PORCINE) 1000 UNIT/ML IJ SOLN
INTRAMUSCULAR | Status: AC
  Filled 2020-06-19: qty 1

## 2020-06-19 MED ORDER — CLINDAMYCIN PHOSPHATE 600 MG/50ML IV SOLN
600.0000 mg | Freq: Once | INTRAVENOUS | Status: DC
  Filled 2020-06-19: qty 50

## 2020-06-19 MED ORDER — SODIUM CHLORIDE 0.9 % IV SOLN: INTRAVENOUS | Status: DC | PRN

## 2020-06-19 MED ORDER — CHLORHEXIDINE GLUCONATE 0.12 % MT SOLN: 15.0000 mL | Freq: Once | OROMUCOSAL | Status: DC

## 2020-06-19 MED ORDER — ONDANSETRON HCL 4 MG/2ML IJ SOLN: 4.0000 mg | Freq: Four times a day (QID) | INTRAMUSCULAR | Status: DC | PRN

## 2020-06-19 MED ORDER — PROTAMINE SULFATE 10 MG/ML IV SOLN
INTRAVENOUS | Status: AC
  Filled 2020-06-19: qty 5

## 2020-06-19 MED ORDER — HEPARIN SODIUM (PORCINE) 1000 UNIT/ML IJ SOLN
INTRAMUSCULAR | Status: AC
  Administered 2020-06-19: 3200 [IU] via INTRAVENOUS
  Filled 2020-06-19: qty 4

## 2020-06-19 MED ORDER — LIDOCAINE 5 % EX PTCH
1.0000 | MEDICATED_PATCH | CUTANEOUS | Status: AC
  Administered 2020-06-19: 1 via TRANSDERMAL
  Filled 2020-06-19: qty 1

## 2020-06-19 MED ORDER — SODIUM CHLORIDE 0.9 % IV SOLN
INTRAVENOUS | Status: DC | PRN
  Administered 2020-06-19: 500 mL

## 2020-06-19 MED ORDER — OXYCODONE HCL 5 MG/5ML PO SOLN: 5.0000 mg | Freq: Once | ORAL | Status: DC | PRN

## 2020-06-19 MED ORDER — 0.9 % SODIUM CHLORIDE (POUR BTL) OPTIME
TOPICAL | Status: DC | PRN
  Administered 2020-06-19: 1000 mL

## 2020-06-19 MED ORDER — CHLORHEXIDINE GLUCONATE CLOTH 2 % EX PADS
6.0000 | MEDICATED_PAD | Freq: Every day | CUTANEOUS | Status: DC
  Administered 2020-06-20: 6 via TOPICAL

## 2020-06-19 MED ORDER — ORAL CARE MOUTH RINSE: 15.0000 mL | Freq: Once | OROMUCOSAL | Status: DC

## 2020-06-19 MED ORDER — PHENYLEPHRINE 40 MCG/ML (10ML) SYRINGE FOR IV PUSH (FOR BLOOD PRESSURE SUPPORT)
PREFILLED_SYRINGE | INTRAVENOUS | Status: AC
  Filled 2020-06-19: qty 10

## 2020-06-19 MED ORDER — PROPOFOL 500 MG/50ML IV EMUL
INTRAVENOUS | Status: DC | PRN
  Administered 2020-06-19: 25 ug/kg/min via INTRAVENOUS

## 2020-06-19 MED ORDER — SODIUM CHLORIDE 0.9 % IV SOLN: INTRAVENOUS | Status: DC

## 2020-06-19 MED ORDER — ONDANSETRON HCL 4 MG/2ML IJ SOLN
INTRAMUSCULAR | Status: AC
  Filled 2020-06-19: qty 2

## 2020-06-19 MED ORDER — PHENYLEPHRINE 40 MCG/ML (10ML) SYRINGE FOR IV PUSH (FOR BLOOD PRESSURE SUPPORT)
PREFILLED_SYRINGE | INTRAVENOUS | Status: DC | PRN
  Administered 2020-06-19: 40 ug via INTRAVENOUS
  Administered 2020-06-19 (×2): 80 ug via INTRAVENOUS
  Administered 2020-06-19: 120 ug via INTRAVENOUS
  Administered 2020-06-19 (×2): 80 ug via INTRAVENOUS

## 2020-06-19 MED ORDER — CLINDAMYCIN PHOSPHATE 600 MG/50ML IV SOLN
INTRAVENOUS | Status: DC | PRN
  Administered 2020-06-19: 600 mg via INTRAVENOUS

## 2020-06-19 MED ORDER — HEPARIN SODIUM (PORCINE) 1000 UNIT/ML IJ SOLN
INTRAMUSCULAR | Status: DC | PRN
  Administered 2020-06-19: 5000 [IU] via INTRAVENOUS

## 2020-06-19 MED ORDER — MIDAZOLAM HCL 5 MG/5ML IJ SOLN
INTRAMUSCULAR | Status: DC | PRN
  Administered 2020-06-19 (×2): 1 mg via INTRAVENOUS

## 2020-06-19 MED ORDER — PROTAMINE SULFATE 10 MG/ML IV SOLN
INTRAVENOUS | Status: DC | PRN
  Administered 2020-06-19: 10 mg via INTRAVENOUS

## 2020-06-19 MED ORDER — MUPIROCIN 2 % EX OINT
1.0000 "application " | TOPICAL_OINTMENT | Freq: Two times a day (BID) | CUTANEOUS | Status: DC
  Filled 2020-06-19: qty 22

## 2020-06-19 MED ORDER — FENTANYL CITRATE (PF) 100 MCG/2ML IJ SOLN
INTRAMUSCULAR | Status: AC
  Filled 2020-06-19: qty 2

## 2020-06-19 MED ORDER — OXYCODONE HCL 5 MG PO TABS: 5.0000 mg | ORAL_TABLET | Freq: Once | ORAL | Status: DC | PRN

## 2020-06-19 MED ORDER — BUPIVACAINE-EPINEPHRINE (PF) 0.5% -1:200000 IJ SOLN
INTRAMUSCULAR | Status: DC | PRN
  Administered 2020-06-19: 10 mL via PERINEURAL

## 2020-06-19 MED ORDER — LIDOCAINE HCL (PF) 1 % IJ SOLN
INTRAMUSCULAR | Status: AC
  Filled 2020-06-19: qty 30

## 2020-06-19 SURGICAL SUPPLY — 29 items
ARMBAND PINK RESTRICT EXTREMIT (MISCELLANEOUS) ×2 IMPLANT
BENZOIN TINCTURE PRP APPL 2/3 (GAUZE/BANDAGES/DRESSINGS) ×2 IMPLANT
CANISTER SUCT 3000ML PPV (MISCELLANEOUS) ×2 IMPLANT
CATH EMB 4FR 40CM (CATHETERS) ×2 IMPLANT
CLIP VESOCCLUDE MED 6/CT (CLIP) ×2 IMPLANT
CLIP VESOCCLUDE SM WIDE 6/CT (CLIP) ×2 IMPLANT
COVER PROBE W GEL 5X96 (DRAPES) ×2 IMPLANT
ELECT REM PT RETURN 9FT ADLT (ELECTROSURGICAL) ×2
ELECTRODE REM PT RTRN 9FT ADLT (ELECTROSURGICAL) ×1 IMPLANT
GOWN STRL REUS W/ TWL LRG LVL3 (GOWN DISPOSABLE) ×2 IMPLANT
GOWN STRL REUS W/ TWL XL LVL3 (GOWN DISPOSABLE) ×1 IMPLANT
GOWN STRL REUS W/TWL LRG LVL3 (GOWN DISPOSABLE) ×2
GOWN STRL REUS W/TWL XL LVL3 (GOWN DISPOSABLE) ×1
GRAFT GORETEX 6X40 (Vascular Products) ×2 IMPLANT
INSERT FOGARTY SM (MISCELLANEOUS) ×4 IMPLANT
KIT BASIN OR (CUSTOM PROCEDURE TRAY) ×2 IMPLANT
KIT TURNOVER KIT B (KITS) ×2 IMPLANT
NS IRRIG 1000ML POUR BTL (IV SOLUTION) ×2 IMPLANT
PACK CV ACCESS (CUSTOM PROCEDURE TRAY) ×2 IMPLANT
PAD ARMBOARD 7.5X6 YLW CONV (MISCELLANEOUS) ×4 IMPLANT
PENCIL SMOKE EVACUATOR (MISCELLANEOUS) ×2 IMPLANT
SUT MNCRL AB 4-0 PS2 18 (SUTURE) ×4 IMPLANT
SUT PROLENE 6 0 BV (SUTURE) ×6 IMPLANT
SUT SILK 2 0 SH (SUTURE) ×2 IMPLANT
SUT VIC AB 3-0 SH 27 (SUTURE) ×2
SUT VIC AB 3-0 SH 27X BRD (SUTURE) ×2 IMPLANT
TOWEL GREEN STERILE (TOWEL DISPOSABLE) ×2 IMPLANT
UNDERPAD 30X36 HEAVY ABSORB (UNDERPADS AND DIAPERS) ×2 IMPLANT
WATER STERILE IRR 1000ML POUR (IV SOLUTION) ×2 IMPLANT

## 2020-06-19 NOTE — Progress Notes (Signed)
Occupational Therapy Treatment Patient Details Name: Shawna [name] MRN: [id number] DOB: [birth year] Today's Date: 06/19/2020    History of present illness Pt adm with clotted AV fistula, anemia, acute hypoxic respiratory failure. s/p tunneled HD catheter Rt IJ 06/17/20. PMH - ESRD, HTN, CAD, CABG, afib, anemia, GI bleed.   OT comments  Pt making progress with functional goals. Pt with some confusion today. OT will continue to follow acutely to maximize level of function and safety  Follow Up Recommendations  Home health OT;Supervision/Assistance - 24 hour    Equipment Recommendations  3 in 1 bedside commode    Recommendations for Other Services      Precautions / Restrictions Precautions Precautions: Fall Restrictions Weight Bearing Restrictions: No       Mobility Bed Mobility Overal bed mobility: Modified Independent Bed Mobility: Supine to Sit;Sit to Supine     Supine to sit: Supervision Sit to supine: Supervision   General bed mobility comments: increased time and effort  Transfers Overall transfer level: Needs assistance Equipment used: Rolling walker (2 wheeled) Transfers: Sit to/from Stand Sit to Stand: Min guard         General transfer comment: Min guard for safety.    Balance Overall balance assessment: Needs assistance Sitting-balance support: Feet supported;No upper extremity supported Sitting balance-Leahy Scale: Good     Standing balance support: During functional activity Standing balance-Leahy Scale: Poor Standing balance comment: reliant on walker                           ADL either performed or assessed with clinical judgement   ADL Overall ADL's : Needs assistance/impaired     Grooming: Set up;Supervision/safety;Sitting;Wash/dry face;Wash/dry Teacher, music: Min Lobbyist Details (indicate cue type and reason): Min Guard A for safety.  Toileting- Clothing Manipulation  and Hygiene: Moderate assistance;Sit to/from stand       Functional mobility during ADLs: Min guard;Rolling walker       Vision Baseline Vision/History: Wears glasses Patient Visual Report: No change from baseline     Perception     Praxis      Cognition Arousal/Alertness: Awake/alert Behavior During Therapy: WFL for tasks assessed/performed Overall Cognitive Status: No family/caregiver present to determine baseline cognitive functioning                                 General Comments: some confusion        Exercises     Shoulder Instructions       General Comments Not able to obtain 02 reading despite multiple attempts. Reports mild SOB with activity but not at rest.    Pertinent Vitals/ Pain       Pain Assessment: 0-10 Pain Score: 4  Pain Location: Dialysis site at L neck, L UE Pain Descriptors / Indicators: Grimacing;Discomfort;Guarding Pain Intervention(s): Monitored during session;Repositioned  Home Living                                          Prior Functioning/Environment              Frequency  Min 2X/week        Progress Toward Goals  OT Goals(current goals  can now be found in the care plan section)  Progress towards OT goals: Progressing toward goals  Acute Rehab OT Goals Patient Stated Goal: Go home  Plan      Co-evaluation                 AM-PAC OT "6 Clicks" Daily Activity     Outcome Measure   Help from another person eating meals?: None Help from another person taking care of personal grooming?: A Little Help from another person toileting, which includes using toliet, bedpan, or urinal?: A Little Help from another person bathing (including washing, rinsing, drying)?: A Little Help from another person to put on and taking off regular upper body clothing?: A Little Help from another person to put on and taking off regular lower body clothing?: A Little 6 Click Score: 19    End of  Session Equipment Utilized During Treatment: Rolling walker;Gait belt;Other (comment) (BSC)  OT Visit Diagnosis: Other abnormalities of gait and mobility (R26.89);Unsteadiness on feet (R26.81);Muscle weakness (generalized) (M62.81);Pain Pain - Right/Left: Left Pain - part of body:  (neck and UE)   Activity Tolerance Patient limited by fatigue   Patient Left in bed;with call bell/phone within reach;with bed alarm set   Nurse Communication          Time:  -     Charges: OT General Charges $OT Visit: 1 Visit OT Treatments $Self Care/Home Management : 8-22 mins     Emmit Alexanders [facility] 06/19/2020, 3:08 PM

## 2020-06-19 NOTE — Anesthesia Procedure Notes (Signed)
Procedure Name: MAC Date/Time: 06/19/2020 9:50 AM Performed by: Harden Mo, CRNA Pre-anesthesia Checklist: Patient identified, Emergency Drugs available, Suction available and Patient being monitored Patient Re-evaluated:Patient Re-evaluated prior to induction Oxygen Delivery Method: Simple face mask Preoxygenation: Pre-oxygenation with 100% oxygen Induction Type: IV induction Placement Confirmation: positive ETCO2 and breath sounds checked- equal and bilateral Dental Injury: Teeth and Oropharynx as per pre-operative assessment

## 2020-06-19 NOTE — Progress Notes (Signed)
Physical Therapy Treatment Patient Details Name: Shawna [name] MRN: [id number] DOB: [birth year] Today's Date: 06/19/2020    History of Present Illness Pt adm with clotted AV fistula, anemia, acute hypoxic respiratory failure. s/p tunneled HD catheter Rt IJ 06/17/20. PMH - ESRD, HTN, CAD, CABG, afib, anemia, GI bleed.    PT Comments    Patient initially lethargic and slightly confused regarding orientation. Tolerated transfers and gait training with min guard-Min A for balance/safety. Some instability noted with ambulation today needing Min A for safety esp during turns. 2/4 DOE with activity but not able to get an Sp02 reading despite multiple attempts. HR stable in 68-70s bpm. Session cut short as pt going down for surgery this morning. Encouraged OOB as much as possible to improve strength/mobility.  Will follow.   Follow Up Recommendations  Home health PT;Supervision for mobility/OOB     Equipment Recommendations  3in1 (PT)    Recommendations for Other Services       Precautions / Restrictions Precautions Precautions: Fall Restrictions Weight Bearing Restrictions: No    Mobility  Bed Mobility Overal bed mobility: Modified Independent Bed Mobility: Supine to Sit;Sit to Supine           General bed mobility comments: No physical assist required  Transfers Overall transfer level: Needs assistance Equipment used: Rolling walker (2 wheeled) Transfers: Sit to/from Stand Sit to Stand: Min guard         General transfer comment: Min guard for safety. Stood from [employer], mildly unsteady.  Ambulation/Gait Ambulation/Gait assistance: Min assist Gait Distance (Feet): 110 Feet Assistive device: Rolling walker (2 wheeled) Gait Pattern/deviations: Step-through pattern;Decreased stride length;Trunk flexed Gait velocity: decreased Gait velocity interpretation: <1.8 ft/sec, indicate of risk for recurrent falls General Gait Details: Slow, mildly unsteady gait with some dynamic  gait instability needing Min A at time for support esp with turns. 2/4 DOE. NOt able to get 02 reading.   Stairs             Wheelchair Mobility    Modified Rankin (Stroke Patients Only)       Balance Overall balance assessment: Needs assistance Sitting-balance support: Feet supported;No upper extremity supported Sitting balance-Leahy Scale: Good     Standing balance support: During functional activity Standing balance-Leahy Scale: Poor Standing balance comment: reliant on walker                            Cognition Arousal/Alertness: Awake/alert Behavior During Therapy: WFL for tasks assessed/performed Overall Cognitive Status: No family/caregiver present to determine baseline cognitive functioning                                 General Comments: "I thought i was at home this morning" "Xmas is in a few weeks" Seems slightly confused this AM. Aware that she is supposed to have surgery today. Pleasant.      Exercises      General Comments General comments (skin integrity, edema, etc.): Not able to obtain 02 reading despite multiple attempts. Reports mild SOB with activity but not at rest.      Pertinent Vitals/Pain Pain Assessment: No/denies pain    Home Living                      Prior Function            PT Goals (current goals can now  be found in the care plan section) Progress towards PT goals: Progressing toward goals    Frequency    Min 3X/week      PT Plan Current plan remains appropriate    Co-evaluation              AM-PAC PT "6 Clicks" Mobility   Outcome Measure  Help needed turning from your back to your side while in a flat bed without using bedrails?: None Help needed moving from lying on your back to sitting on the side of a flat bed without using bedrails?: None Help needed moving to and from a bed to a chair (including a wheelchair)?: A Little Help needed standing up from a chair using  your arms (e.g., wheelchair or bedside chair)?: A Little Help needed to walk in hospital room?: A Little Help needed climbing 3-5 steps with a railing? : A Lot 6 Click Score: 19    End of Session Equipment Utilized During Treatment: Gait belt Activity Tolerance: Patient tolerated treatment well Patient left: in bed;with call bell/phone within reach;with nursing/sitter in room (RN in room preparing pt to go down for surgery) Nurse Communication: Mobility status PT Visit Diagnosis: Unsteadiness on feet (R26.81);History of falling (Z91.81);Muscle weakness (generalized) (M62.81);Difficulty in walking, not elsewhere classified (R26.2)     Time: 3254-9826 PT Time Calculation (min) (ACUTE ONLY): 14 min  Charges:  $Gait Training: 8-22 mins                     Marisa Severin, PT, DPT Acute Rehabilitation Services Pager [phone] Office [place] 06/19/2020, 11:38 AM

## 2020-06-19 NOTE — Progress Notes (Signed)
Progress Note  Patient Name: Shawna [name] Date of Encounter: 06/19/2020  [facility] Cardiologist: New - Ledell Noss   Subjective   79-year-old female with a history of coronary artery disease, CABG, hypertension, hyperlipidemia, paroxysmal atrial fibrillation, nonsustained ventricular tachycardia, ASD.  She was evaluated yesterday for an episode of chest discomfort at the request of Dr. Vikki Ports.  She has known episodes of angina.  She typically has episodes of chest pain when her blood pressure goes up today.  We added metoprolol 25 mg twice a day yesterday.  Her blood pressure is on the low side today.  Inpatient Medications    Scheduled Meds: . alum & mag hydroxide-simeth  30 mL Oral Once  . amiodarone  200 mg Oral Daily  . aspirin EC  81 mg Oral Daily  . Chlorhexidine Gluconate Cloth  6 each Topical Daily  . darbepoetin (ARANESP) injection - DIALYSIS  60 mcg Intravenous Q Mon-HD  . dicyclomine  10 mg Oral BID  . gabapentin  100 mg Oral QHS  . isosorbide mononitrate  60 mg Oral Daily  . metoprolol tartrate  25 mg Oral BID  . mupirocin ointment  1 application Nasal BID  . sevelamer carbonate  1,600 mg Oral TID WC  . simvastatin  20 mg Oral QHS  . sodium chloride flush  10-40 mL Intracatheter Q12H   Continuous Infusions:  PRN Meds: loratadine, nitroGLYCERIN, sodium chloride flush   Vital Signs    Vitals:   06/19/20 0849 06/19/20 1130 06/19/20 1145 06/19/20 1217  BP: (!) 137/48 (!) 105/50 (!) 116/42 (!) 102/41  Pulse: 71 65 63 63  Resp: 17 14 19 16   Temp: 98.5 F (36.9 C) (!) 97.2 F (36.2 C) 98.2 F (36.8 C) 97.8 F (36.6 C)  TempSrc: Oral   Oral  SpO2: 100% 98% 98% 93%  Weight: 62 kg     Height: 5\' 1"  (1.549 m)       Intake/Output Summary (Last 24 hours) at 06/19/2020 1424 Last data filed at 06/19/2020 1300 Gross per 24 hour  Intake 787.67 ml  Output 32 ml  Net 755.67 ml   Last 3 Weights 06/19/2020 06/19/2020 06/18/2020  Weight (lbs) 136 lb 11 oz 136 lb  11 oz 141 lb 8.6 oz  Weight (kg) 62 kg 62 kg 64.2 kg      Telemetry    NSR  - Personally Reviewed  ECG     - Personally Reviewed  Physical Exam   GEN: No acute distress.   Neck: No JVD Cardiac: RRR, no murmurs, rubs, or gallops.  Respiratory: Clear to auscultation bilaterally. GI: Soft, nontender, non-distended  MS: No edema; No deformity.  Left upper arm fistula  Neuro:  Nonfocal  Psych: Normal affect   Labs    High Sensitivity Troponin:   Recent Labs  Lab 06/12/20 2136 06/13/20 0352 06/13/20 1125 06/17/20 2046  TROPONINIHS 59* 155* 124* 127*      Chemistry Recent Labs  Lab 06/16/20 0344 06/17/20 0116 06/18/20 0338 06/19/20 0252  NA 138 138 138 135  K 4.9 4.5 5.3* 3.6  CL 101 99 100 93*  CO2 23 21* 19* 27  GLUCOSE 136* 143* 125* 114*  BUN 40* 72* 110* 51*  CREATININE 7.76* 9.87* 13.37* 7.44*  CALCIUM 9.5 9.3 8.8* 8.4*  ALBUMIN 2.9* 2.8* 2.8*  --   GFRNONAA 5* 4* 3* 5*  ANIONGAP 14 18* 19* 15     Hematology Recent Labs  Lab 06/16/20 0344 06/17/20 1941 06/18/20 4403  06/19/20 0252  WBC 7.8  --  11.5* 9.2  RBC 2.88*  --  2.75* 2.62*  HGB 9.8* 10.5* 9.3* 9.0*  HCT 31.2* 33.6* 29.8* 28.6*  MCV 108.3*  --  108.4* 109.2*  MCH 34.0  --  33.8 34.4*  MCHC 31.4  --  31.2 31.5  RDW 23.2*  --  22.5* 22.4*  PLT 190  --  208 176    BNPNo results for input(s): BNP, PROBNP in the last 168 hours.   DDimer No results for input(s): DDIMER in the last 168 hours.   Radiology    IR Fluoro Guide CV Line Right  Result Date: 06/18/2020 CLINICAL DATA:  Failed attempt at percutaneous declot of an occluded left upper arm dialysis graft. Current left IJ temporary non tunneled dialysis catheter in place. EXAM: TUNNELED CENTRAL VENOUS HEMODIALYSIS CATHETER PLACEMENT WITH ULTRASOUND AND FLUOROSCOPIC GUIDANCE ANESTHESIA/SEDATION: 1.0 mg IV Versed; 25 mcg IV Fentanyl. Total Moderate Sedation Time:   32 minutes. The patient's level of consciousness and physiologic status  were continuously monitored during the procedure by Radiology nursing. MEDICATIONS: 1 g IV vancomycin. FLUOROSCOPY TIME:  1.0 minute.  3.4 mGy. PROCEDURE: The procedure, risks, benefits, and alternatives were explained to the patient's husband. Questions regarding the procedure were encouraged and answered. The patient's husband understands and consents to the procedure. A timeout was performed prior to initiating the procedure. The right neck and chest were prepped with chlorhexidine in a sterile fashion, and a sterile drape was applied covering the operative field. Maximum barrier sterile technique with sterile gowns and gloves were used for the procedure. Local anesthesia was provided with 1% lidocaine. Ultrasound was used to evaluate patency of right internal and external jugular veins. After creating a small venotomy incision, a 21 gauge needle was advanced into the right external jugular vein under direct, real-time ultrasound guidance. Ultrasound image documentation was performed. After securing guidewire access, an 8 Fr dilator was placed. A J-wire was kinked to measure appropriate catheter length. A Palindrome tunneled hemodialysis catheter measuring 19 cm from tip to cuff was chosen for placement. This was tunneled in a retrograde fashion from the chest wall to the venotomy incision. At the venotomy, serial dilatation was performed and a 15 Fr peel-away sheath was placed over a guidewire. The catheter was then placed through the sheath and the sheath removed. Final catheter positioning was confirmed and documented with a fluoroscopic spot image. The catheter was aspirated, flushed with saline, and injected with appropriate volume heparin dwells. The venotomy incision was closed with subcuticular 4-0 Vicryl. Dermabond was applied to the incision. The catheter exit site was secured with 0-Prolene retention sutures. Following tunnel catheter placement, the left jugular non tunneled temporary hemodialysis  catheter was removed and manual compression applied over the catheter exit site. COMPLICATIONS: None.  No pneumothorax. 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 There is a patent external jugular vein present which empties directly into the medial subclavian vein. The medial aspect of the external jugular vein near its juncture with the subclavian vein was accessed for catheter placement. After new tunneled catheter placement, the tip lies in the right atrium. The catheter aspirates normally and is ready for immediate use. IMPRESSION: Placement of tunneled hemodialysis catheter via the right external jugular vein. The catheter tip lies in the right atrium. The catheter is ready for immediate use. After placement, the non tunneled left jugular temporary dialysis catheter was removed. Electronically Signed   By: Jenness Corner.D.  On: 06/18/2020 15:21   IR US Guide Vasc Access Left  Result Date: 06/18/2020 CLINICAL DATA:  Occluded left upper arm brachial to axillary dialysis graft. EXAM: 1. ULTRASOUND GUIDANCE FOR VASCULAR ACCESS OF LEFT UPPER ARM DIALYSIS GRAFT. 2. ABORTED AND PARTIALLY COMPLETED DIALYSIS FISTULA DECLOT PROCEDURE WITH TWO SEPARATE GRAFT ACCESS SITES. 3. VENOUS ANGIOPLASTY OF DIALYSIS GRAFT AND VENOUS OUTFLOW. ANESTHESIA/SEDATION: Moderate (conscious) sedation was employed during this procedure. A total of Versed 2.0 mg and Fentanyl 125 mcg was administered intravenously. Moderate Sedation Time: 108 minutes. The patient's level of consciousness and vital signs were monitored continuously by radiology nursing throughout the procedure under my direct supervision. CONTRAST:  23mL OMNIPAQUE IOHEXOL 300 MG/ML  SOLN MEDICATIONS: 2 mg tPA, 4000 U IV heparin FLUOROSCOPY TIME:  9 minutes and 18 seconds.  15.5 mGy. PROCEDURE: The procedure, risks, benefits, and alternatives were explained to the patient. Questions regarding the procedure were encouraged and answered. The  patient understands and consents to the procedure. A time-out was performed prior to initiating the procedure. Ultrasound interrogation of the left arm dialysis graft was performed. The left upper arm dialysis graft was prepped with chlorhexidine in a sterile fashion, and a sterile drape was applied covering the operative field. A sterile gown and sterile gloves were used for the procedure. Local anesthesia was provided with 1% Lidocaine. Preliminary ultrasound was performed of the dialysis fistula. Ultrasound-guided antegrade graft access was performed with a micropuncture set. Ultrasound image documentation was performed. 1 mg of t-PA was instilled. A 6 French sheath was placed over a guidewire. A diagnostic catheter was advanced and contrast injection performed at the level of patent venous outflow. Outflow venography was also performed via the catheter with visualization of central venous outflow. Angiojet thrombectomy was performed throughout the left upper arm graft and at the level of a venous outflow stent. Balloon angioplasty was performed at the level of the venous outflow stent and within the dialysis graft with a 7 mm x 4 cm Conquest balloon. Retrograde access of the dialysis graft was then performed with a micropuncture set under ultrasound guidance. A 6 French sheath was placed. Thrombectomy across the arterial anastomosis and within the proximal graft was then performed with a 4-French Fogarty balloon catheter. Several passes were made with the Fogarty catheter. Suction thrombectomy was then performed through both sheaths. Graft patency was reassessed with angiography. This was followed by additional Angiojet thrombectomy in both antegrade and retrograde directions and additional balloon angioplasty with the 7 mm balloon. Ultimately, the procedure was aborted and both sheaths removed with application Ethilon pursestring sutures. COMPLICATIONS: None FINDINGS: Ultrasound confirms thrombosis of the  graft. The arterial anastomosis appeared patent by ultrasound. Just beyond the arterial anastomosis, there is dilatation of the proximal graft followed by thrombus causing occlusion of the graft. After graft access, there also is visualization of an elongated stent which extends from the distal graft, across the venous anastomosis and into the outflow axillary vein. The entire stent is occluded and thrombus extends into the axillary vein beyond the stent. After initial mechanical thrombectomy and Fogarty thrombectomy, the proximal aspect of the dialysis graft demonstrated normal patency including the arterial anastomosis and approximately half of the dialysis graft. The lumen of the indwelling venous outflow stent was also able to be dilated. However, there was an area of resistant stenosis within the midportion of the graft that could be dilated but demonstrated persistent collapse and recurrent occlusion suggestive of focally deteriorated graft material. It was not felt that a  covered stent could be placed in this region as this is currently a segment that the graft would need to be accessed for dialysis access. At this point during the procedure, it was decided to abort further attempt at percutaneous intervention and proceed with placement of a tunneled dialysis catheter for immediate dialysis needs. The patient will need eventual surgical revision of the dialysis graft versus new access. IMPRESSION: Lack of ability to maintain patency of an occluded left upper arm brachial to axillary vein dialysis graft primarily due to what appears to be a deteriorated and resistant segment within the midportion of the graft. An indwelling stent is present from the region of the distal graft into the outflow axillary vein. ACCESS: Due to inability to maintain patency of the occluded dialysis graft, a tunneled dialysis catheter was placed following the procedure. This procedure is dictated separately. It is not felt that  repeat attempt at percutaneous declot would be fruitful. Recommend vascular surgical consultation for consideration of surgical revision of the current access versus placement of new access. Electronically Signed   By: Aletta Edouard M.D.   On: 06/18/2020 15:16   IR US Guide Vasc Access Right  Result Date: 06/18/2020 CLINICAL DATA:  Failed attempt at percutaneous declot of an occluded left upper arm dialysis graft. Current left IJ temporary non tunneled dialysis catheter in place. EXAM: TUNNELED CENTRAL VENOUS HEMODIALYSIS CATHETER PLACEMENT WITH ULTRASOUND AND FLUOROSCOPIC GUIDANCE ANESTHESIA/SEDATION: 1.0 mg IV Versed; 25 mcg IV Fentanyl. Total Moderate Sedation Time:   32 minutes. The patient's level of consciousness and physiologic status were continuously monitored during the procedure by Radiology nursing. MEDICATIONS: 1 g IV vancomycin. FLUOROSCOPY TIME:  1.0 minute.  3.4 mGy. PROCEDURE: The procedure, risks, benefits, and alternatives were explained to the patient's husband. Questions regarding the procedure were encouraged and answered. The patient's husband understands and consents to the procedure. A timeout was performed prior to initiating the procedure. The right neck and chest were prepped with chlorhexidine in a sterile fashion, and a sterile drape was applied covering the operative field. Maximum barrier sterile technique with sterile gowns and gloves were used for the procedure. Local anesthesia was provided with 1% lidocaine. Ultrasound was used to evaluate patency of right internal and external jugular veins. After creating a small venotomy incision, a 21 gauge needle was advanced into the right external jugular vein under direct, real-time ultrasound guidance. Ultrasound image documentation was performed. After securing guidewire access, an 8 Fr dilator was placed. A J-wire was kinked to measure appropriate catheter length. A Palindrome tunneled hemodialysis catheter measuring 19 cm from  tip to cuff was chosen for placement. This was tunneled in a retrograde fashion from the chest wall to the venotomy incision. At the venotomy, serial dilatation was performed and a 15 Fr peel-away sheath was placed over a guidewire. The catheter was then placed through the sheath and the sheath removed. Final catheter positioning was confirmed and documented with a fluoroscopic spot image. The catheter was aspirated, flushed with saline, and injected with appropriate volume heparin dwells. The venotomy incision was closed with subcuticular 4-0 Vicryl. Dermabond was applied to the incision. The catheter exit site was secured with 0-Prolene retention sutures. Following tunnel catheter placement, the left jugular non tunneled temporary hemodialysis catheter was removed and manual compression applied over the catheter exit site. COMPLICATIONS: None.  No pneumothorax. 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 There is a patent external jugular vein present which empties directly into the medial subclavian vein.  The medial aspect of the external jugular vein near its juncture with the subclavian vein was accessed for catheter placement. After new tunneled catheter placement, the tip lies in the right atrium. The catheter aspirates normally and is ready for immediate use. IMPRESSION: Placement of tunneled hemodialysis catheter via the right external jugular vein. The catheter tip lies in the right atrium. The catheter is ready for immediate use. After placement, the non tunneled left jugular temporary dialysis catheter was removed. Electronically Signed   By: Aletta Edouard M.D.   On: 06/18/2020 15:21   IR THROMBECTOMY AV FISTULA W/THROMBOLYSIS INC/SHUNT/IMG LEFT  Result Date: 06/18/2020 CLINICAL DATA:  Occluded left upper arm brachial to axillary dialysis graft. EXAM: 1. ULTRASOUND GUIDANCE FOR VASCULAR ACCESS OF LEFT UPPER ARM DIALYSIS GRAFT. 2. ABORTED AND PARTIALLY COMPLETED DIALYSIS  FISTULA DECLOT PROCEDURE WITH TWO SEPARATE GRAFT ACCESS SITES. 3. VENOUS ANGIOPLASTY OF DIALYSIS GRAFT AND VENOUS OUTFLOW. ANESTHESIA/SEDATION: Moderate (conscious) sedation was employed during this procedure. A total of Versed 2.0 mg and Fentanyl 125 mcg was administered intravenously. Moderate Sedation Time: 108 minutes. The patient's level of consciousness and vital signs were monitored continuously by radiology nursing throughout the procedure under my direct supervision. CONTRAST:  42mL OMNIPAQUE IOHEXOL 300 MG/ML  SOLN MEDICATIONS: 2 mg tPA, 4000 U IV heparin FLUOROSCOPY TIME:  9 minutes and 18 seconds.  15.5 mGy. PROCEDURE: The procedure, risks, benefits, and alternatives were explained to the patient. Questions regarding the procedure were encouraged and answered. The patient understands and consents to the procedure. A time-out was performed prior to initiating the procedure. Ultrasound interrogation of the left arm dialysis graft was performed. The left upper arm dialysis graft was prepped with chlorhexidine in a sterile fashion, and a sterile drape was applied covering the operative field. A sterile gown and sterile gloves were used for the procedure. Local anesthesia was provided with 1% Lidocaine. Preliminary ultrasound was performed of the dialysis fistula. Ultrasound-guided antegrade graft access was performed with a micropuncture set. Ultrasound image documentation was performed. 1 mg of t-PA was instilled. A 6 French sheath was placed over a guidewire. A diagnostic catheter was advanced and contrast injection performed at the level of patent venous outflow. Outflow venography was also performed via the catheter with visualization of central venous outflow. Angiojet thrombectomy was performed throughout the left upper arm graft and at the level of a venous outflow stent. Balloon angioplasty was performed at the level of the venous outflow stent and within the dialysis graft with a 7 mm x 4 cm  Conquest balloon. Retrograde access of the dialysis graft was then performed with a micropuncture set under ultrasound guidance. A 6 French sheath was placed. Thrombectomy across the arterial anastomosis and within the proximal graft was then performed with a 4-French Fogarty balloon catheter. Several passes were made with the Fogarty catheter. Suction thrombectomy was then performed through both sheaths. Graft patency was reassessed with angiography. This was followed by additional Angiojet thrombectomy in both antegrade and retrograde directions and additional balloon angioplasty with the 7 mm balloon. Ultimately, the procedure was aborted and both sheaths removed with application Ethilon pursestring sutures. COMPLICATIONS: None FINDINGS: Ultrasound confirms thrombosis of the graft. The arterial anastomosis appeared patent by ultrasound. Just beyond the arterial anastomosis, there is dilatation of the proximal graft followed by thrombus causing occlusion of the graft. After graft access, there also is visualization of an elongated stent which extends from the distal graft, across the venous anastomosis and into the outflow axillary vein. The  entire stent is occluded and thrombus extends into the axillary vein beyond the stent. After initial mechanical thrombectomy and Fogarty thrombectomy, the proximal aspect of the dialysis graft demonstrated normal patency including the arterial anastomosis and approximately half of the dialysis graft. The lumen of the indwelling venous outflow stent was also able to be dilated. However, there was an area of resistant stenosis within the midportion of the graft that could be dilated but demonstrated persistent collapse and recurrent occlusion suggestive of focally deteriorated graft material. It was not felt that a covered stent could be placed in this region as this is currently a segment that the graft would need to be accessed for dialysis access. At this point during the  procedure, it was decided to abort further attempt at percutaneous intervention and proceed with placement of a tunneled dialysis catheter for immediate dialysis needs. The patient will need eventual surgical revision of the dialysis graft versus new access. IMPRESSION: Lack of ability to maintain patency of an occluded left upper arm brachial to axillary vein dialysis graft primarily due to what appears to be a deteriorated and resistant segment within the midportion of the graft. An indwelling stent is present from the region of the distal graft into the outflow axillary vein. ACCESS: Due to inability to maintain patency of the occluded dialysis graft, a tunneled dialysis catheter was placed following the procedure. This procedure is dictated separately. It is not felt that repeat attempt at percutaneous declot would be fruitful. Recommend vascular surgical consultation for consideration of surgical revision of the current access versus placement of new access. Electronically Signed   By: Aletta Edouard M.D.   On: 06/18/2020 15:16    Cardiac Studies     Patient Profile     79 y.o. female admitted with chest pain - atypical   Assessment & Plan    1.  Chest pain:  No further chest pain   2.   CAD - s/p CABG :  No angina   3.  HTN:  BP is better   4.  ESRD:   Had graft revision.  Getting HD currently       For questions or updates, please contact [place] Please consult [email or web address] for contact info under        Signed, Mertie Moores, MD  06/19/2020, 2:24 PM

## 2020-06-19 NOTE — Progress Notes (Signed)
[facility]  Subjective  - no complaints.  States left arm sore after IR attempted percutaenous declot.   Objective (!) 137/48 71 98.5 F (36.9 C) (Oral) 17 100%  Intake/Output Summary (Last 24 hours) at 06/19/2020 0900 Last data filed at 06/18/2020 2102 Gross per 24 hour  Intake 850 ml  Output 602 ml  Net 248 ml    Left upper arm AVG with thrill at antecubital fossa  Laboratory Lab Results: Recent Labs    06/18/20 0338 06/19/20 0252  WBC 11.5* 9.2  HGB 9.3* 9.0*  HCT 29.8* 28.6*  PLT 208 176   BMET Recent Labs    06/18/20 0338 06/19/20 0252  NA 138 135  K 5.3* 3.6  CL 100 93*  CO2 19* 27  GLUCOSE 125* 114*  BUN 110* 51*  CREATININE 13.37* 7.44*  CALCIUM 8.8* 8.4*    COAG Lab Results  Component Value Date   INR 1.1 06/12/2020   INR 1.1 03/08/2019   INR 1.1 03/07/2019   No results found for: PTT  Assessment/Planning:  IR unable to declot left upper arm AVG placed in 2018 at [place].  Will attempt revision in OR with likely jump graft since I can appreciate a thrill more proximal at antecubtial fossa.  Discussed will need to use [facility] for next one month while this heals and some chance it may not work.  Marty Heck 06/19/2020 9:00 AM --

## 2020-06-19 NOTE — Transfer of Care (Signed)
Immediate Anesthesia Transfer of Care Note  Patient: Shawna [name]  Procedure(s) Performed: REVISON OF LEFT ARM ARTERIOVENOUS GRAFT (Left Arm Upper)  Patient Location: PACU  Anesthesia Type:MAC and Regional  Level of Consciousness: awake and alert   Airway & Oxygen Therapy: Patient Spontanous Breathing  Post-op Assessment: Report given to RN and Post -op Vital signs reviewed and stable  Post vital signs: Reviewed and stable  Last Vitals:  Vitals Value Taken Time  BP 98/36 06/19/20 1127  Temp    Pulse    Resp 19 06/19/20 1129  SpO2    Vitals shown include unvalidated device data.  Last Pain:  Vitals:   06/19/20 0849  TempSrc: Oral  PainSc:       Patients Stated Pain Goal: 0 (37/[id number])  Complications: No complications documented.

## 2020-06-19 NOTE — Progress Notes (Signed)
Shawna [name]  Assessment/ Plan: Pt is a 79 y.o. yo female ESRD on HD, CAD CABG admitted with clotted AV fistula, shortness of breath and hypertensive urgency.  OP HD: [name]   3.5h  300/600  61kg  2/2.5 bath  LUE AVF  Hep none  - hect 2.5 ug tiw  - epo 8000 IV tiw   #SOB/ hypertensive urgency/ pulm edema: Improved after dialysis.  Currently in room air and BP controlled.  UF during HD.  # Clotted LUE AVF: Had thrombectomy and balloon dilatation of AV fistula however unable to maintain graft patency due to persistent occlusion in mid graft.  Right IJ TDC was placed by IR on 12/8.  I consulted [facility] who is planning to do revision of AV graft today, appreciated.  However, we need to use tunnel catheter for at least a month.  #ESRD- usual HD MWF.  She had dialysis yesterday off schedule, tolerated well.  Plan to do vascular procedure today.  We will do dialysis either later today or tomorrow morning depending on the schedule at HD unit and surgery.  # Chest pain: better. Prob due to HTN/ volume overload.  Per primary team.  #Hypertension/volume: Did not get a.m. medication because of n.p.o.  Monitor BP.  UF as tolerated.  # Anemia: Received blood transfusion. Continue aranesp on HD.  #Secondary hyperparathyroidism: Renvela dose increased, monitor phosphorus level.  Subjective: Seen and examined.  Tolerated dialysis well yesterday.  Plan for vascular procedure.  She denies nausea vomiting chest pain shortness of breath.  Eager to go home.  objective Vital signs in last 24 hours: Vitals:   06/18/20 2041 06/19/20 0428 06/19/20 0750 06/19/20 0849  BP: (!) 123/43 (!) 111/33 (!) 133/44 (!) 137/48  Pulse: 79 69 66 71  Resp: 18 19 20 17   Temp: 98.3 F (36.8 C) (!) 97.5 F (36.4 C) 98.3 F (36.8 C) 98.5 F (36.9 C)  TempSrc: Oral Oral Oral Oral  SpO2: 100% 98% 100% 100%  Weight:  62 kg  62 kg  Height:    5\' 1"  (1.549 m)   Weight change:  1.6 kg  Intake/Output Summary (Last 24 hours) at 06/19/2020 1010 Last data filed at 06/18/2020 2102 Gross per 24 hour  Intake 850 ml  Output 602 ml  Net 248 ml       Labs: Basic Metabolic Panel: Recent Labs  Lab 06/16/20 0344 06/17/20 0116 06/18/20 0338 06/19/20 0252  NA 138 138 138 135  K 4.9 4.5 5.3* 3.6  CL 101 99 100 93*  CO2 23 21* 19* 27  GLUCOSE 136* 143* 125* 114*  BUN 40* 72* 110* 51*  CREATININE 7.76* 9.87* 13.37* 7.44*  CALCIUM 9.5 9.3 8.8* 8.4*  PHOS 5.4* 6.3* 7.9*  --    Liver Function Tests: Recent Labs  Lab 06/16/20 0344 06/17/20 0116 06/18/20 0338  ALBUMIN 2.9* 2.8* 2.8*   No results for input(s): LIPASE, AMYLASE in the last 168 hours. No results for input(s): AMMONIA in the last 168 hours. CBC: Recent Labs  Lab 06/14/20 0154 06/14/20 0542 06/15/20 0517 06/16/20 0344 06/17/20 1941 06/18/20 0338 06/19/20 0252  WBC 5.4  --  5.2 7.8  --  11.5* 9.2  HGB 7.1*   < > 8.1* 9.8* 10.5* 9.3* 9.0*  HCT 21.6*   < > 25.9* 31.2* 33.6* 29.8* 28.6*  MCV 109.6*  --  108.4* 108.3*  --  108.4* 109.2*  PLT 111*  --  157 190  --  208 176   < > = values in this interval not displayed.   Cardiac Enzymes: No results for input(s): CKTOTAL, CKMB, CKMBINDEX, TROPONINI in the last 168 hours. CBG: Recent Labs  Lab 06/13/20 1123  GLUCAP 138*    Iron Studies: No results for input(s): IRON, TIBC, TRANSFERRIN, FERRITIN in the last 72 hours. Studies/Results: IR Fluoro Guide CV Line Right  Result Date: 06/18/2020 CLINICAL DATA:  Failed attempt at percutaneous declot of an occluded left upper arm dialysis graft. Current left IJ temporary non tunneled dialysis catheter in place. EXAM: TUNNELED CENTRAL VENOUS HEMODIALYSIS CATHETER PLACEMENT WITH ULTRASOUND AND FLUOROSCOPIC GUIDANCE ANESTHESIA/SEDATION: 1.0 mg IV Versed; 25 mcg IV Fentanyl. Total Moderate Sedation Time:   32 minutes. The patient's level of consciousness and physiologic status were continuously monitored  during the procedure by Radiology nursing. MEDICATIONS: 1 g IV vancomycin. FLUOROSCOPY TIME:  1.0 minute.  3.4 mGy. PROCEDURE: The procedure, risks, benefits, and alternatives were explained to the patient's husband. Questions regarding the procedure were encouraged and answered. The patient's husband understands and consents to the procedure. A timeout was performed prior to initiating the procedure. The right neck and chest were prepped with chlorhexidine in a sterile fashion, and a sterile drape was applied covering the operative field. Maximum barrier sterile technique with sterile gowns and gloves were used for the procedure. Local anesthesia was provided with 1% lidocaine. Ultrasound was used to evaluate patency of right internal and external jugular veins. After creating a small venotomy incision, a 21 gauge needle was advanced into the right external jugular vein under direct, real-time ultrasound guidance. Ultrasound image documentation was performed. After securing guidewire access, an 8 Fr dilator was placed. A J-wire was kinked to measure appropriate catheter length. A Palindrome tunneled hemodialysis catheter measuring 19 cm from tip to cuff was chosen for placement. This was tunneled in a retrograde fashion from the chest wall to the venotomy incision. At the venotomy, serial dilatation was performed and a 15 Fr peel-away sheath was placed over a guidewire. The catheter was then placed through the sheath and the sheath removed. Final catheter positioning was confirmed and documented with a fluoroscopic spot image. The catheter was aspirated, flushed with saline, and injected with appropriate volume heparin dwells. The venotomy incision was closed with subcuticular 4-0 Vicryl. Dermabond was applied to the incision. The catheter exit site was secured with 0-Prolene retention sutures. Following tunnel catheter placement, the left jugular non tunneled temporary hemodialysis catheter was removed and manual  compression applied over the catheter exit site. COMPLICATIONS: None.  No pneumothorax. 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 There is a patent external jugular vein present which empties directly into the medial subclavian vein. The medial aspect of the external jugular vein near its juncture with the subclavian vein was accessed for catheter placement. After new tunneled catheter placement, the tip lies in the right atrium. The catheter aspirates normally and is ready for immediate use. IMPRESSION: Placement of tunneled hemodialysis catheter via the right external jugular vein. The catheter tip lies in the right atrium. The catheter is ready for immediate use. After placement, the non tunneled left jugular temporary dialysis catheter was removed. Electronically Signed   By: Aletta Edouard M.D.   On: 06/18/2020 15:21   IR US Guide Vasc Access Left  Result Date: 06/18/2020 CLINICAL DATA:  Occluded left upper arm brachial to axillary dialysis graft. EXAM: 1. ULTRASOUND GUIDANCE FOR VASCULAR ACCESS OF LEFT UPPER ARM DIALYSIS GRAFT. 2. ABORTED AND PARTIALLY  COMPLETED DIALYSIS FISTULA DECLOT PROCEDURE WITH TWO SEPARATE GRAFT ACCESS SITES. 3. VENOUS ANGIOPLASTY OF DIALYSIS GRAFT AND VENOUS OUTFLOW. ANESTHESIA/SEDATION: Moderate (conscious) sedation was employed during this procedure. A total of Versed 2.0 mg and Fentanyl 125 mcg was administered intravenously. Moderate Sedation Time: 108 minutes. The patient's level of consciousness and vital signs were monitored continuously by radiology nursing throughout the procedure under my direct supervision. CONTRAST:  3mL OMNIPAQUE IOHEXOL 300 MG/ML  SOLN MEDICATIONS: 2 mg tPA, 4000 U IV heparin FLUOROSCOPY TIME:  9 minutes and 18 seconds.  15.5 mGy. PROCEDURE: The procedure, risks, benefits, and alternatives were explained to the patient. Questions regarding the procedure were encouraged and answered. The patient understands and consents to  the procedure. A time-out was performed prior to initiating the procedure. Ultrasound interrogation of the left arm dialysis graft was performed. The left upper arm dialysis graft was prepped with chlorhexidine in a sterile fashion, and a sterile drape was applied covering the operative field. A sterile gown and sterile gloves were used for the procedure. Local anesthesia was provided with 1% Lidocaine. Preliminary ultrasound was performed of the dialysis fistula. Ultrasound-guided antegrade graft access was performed with a micropuncture set. Ultrasound image documentation was performed. 1 mg of t-PA was instilled. A 6 French sheath was placed over a guidewire. A diagnostic catheter was advanced and contrast injection performed at the level of patent venous outflow. Outflow venography was also performed via the catheter with visualization of central venous outflow. Angiojet thrombectomy was performed throughout the left upper arm graft and at the level of a venous outflow stent. Balloon angioplasty was performed at the level of the venous outflow stent and within the dialysis graft with a 7 mm x 4 cm Conquest balloon. Retrograde access of the dialysis graft was then performed with a micropuncture set under ultrasound guidance. A 6 French sheath was placed. Thrombectomy across the arterial anastomosis and within the proximal graft was then performed with a 4-French Fogarty balloon catheter. Several passes were made with the Fogarty catheter. Suction thrombectomy was then performed through both sheaths. Graft patency was reassessed with angiography. This was followed by additional Angiojet thrombectomy in both antegrade and retrograde directions and additional balloon angioplasty with the 7 mm balloon. Ultimately, the procedure was aborted and both sheaths removed with application Ethilon pursestring sutures. COMPLICATIONS: None FINDINGS: Ultrasound confirms thrombosis of the graft. The arterial anastomosis appeared  patent by ultrasound. Just beyond the arterial anastomosis, there is dilatation of the proximal graft followed by thrombus causing occlusion of the graft. After graft access, there also is visualization of an elongated stent which extends from the distal graft, across the venous anastomosis and into the outflow axillary vein. The entire stent is occluded and thrombus extends into the axillary vein beyond the stent. After initial mechanical thrombectomy and Fogarty thrombectomy, the proximal aspect of the dialysis graft demonstrated normal patency including the arterial anastomosis and approximately half of the dialysis graft. The lumen of the indwelling venous outflow stent was also able to be dilated. However, there was an area of resistant stenosis within the midportion of the graft that could be dilated but demonstrated persistent collapse and recurrent occlusion suggestive of focally deteriorated graft material. It was not felt that a covered stent could be placed in this region as this is currently a segment that the graft would need to be accessed for dialysis access. At this point during the procedure, it was decided to abort further attempt at percutaneous intervention and proceed with  placement of a tunneled dialysis catheter for immediate dialysis needs. The patient will need eventual surgical revision of the dialysis graft versus new access. IMPRESSION: Lack of ability to maintain patency of an occluded left upper arm brachial to axillary vein dialysis graft primarily due to what appears to be a deteriorated and resistant segment within the midportion of the graft. An indwelling stent is present from the region of the distal graft into the outflow axillary vein. ACCESS: Due to inability to maintain patency of the occluded dialysis graft, a tunneled dialysis catheter was placed following the procedure. This procedure is dictated separately. It is not felt that repeat attempt at percutaneous declot would be  fruitful. Recommend vascular surgical consultation for consideration of surgical revision of the current access versus placement of new access. Electronically Signed   By: Aletta Edouard M.D.   On: 06/18/2020 15:16   IR US Guide Vasc Access Right  Result Date: 06/18/2020 CLINICAL DATA:  Failed attempt at percutaneous declot of an occluded left upper arm dialysis graft. Current left IJ temporary non tunneled dialysis catheter in place. EXAM: TUNNELED CENTRAL VENOUS HEMODIALYSIS CATHETER PLACEMENT WITH ULTRASOUND AND FLUOROSCOPIC GUIDANCE ANESTHESIA/SEDATION: 1.0 mg IV Versed; 25 mcg IV Fentanyl. Total Moderate Sedation Time:   32 minutes. The patient's level of consciousness and physiologic status were continuously monitored during the procedure by Radiology nursing. MEDICATIONS: 1 g IV vancomycin. FLUOROSCOPY TIME:  1.0 minute.  3.4 mGy. PROCEDURE: The procedure, risks, benefits, and alternatives were explained to the patient's husband. Questions regarding the procedure were encouraged and answered. The patient's husband understands and consents to the procedure. A timeout was performed prior to initiating the procedure. The right neck and chest were prepped with chlorhexidine in a sterile fashion, and a sterile drape was applied covering the operative field. Maximum barrier sterile technique with sterile gowns and gloves were used for the procedure. Local anesthesia was provided with 1% lidocaine. Ultrasound was used to evaluate patency of right internal and external jugular veins. After creating a small venotomy incision, a 21 gauge needle was advanced into the right external jugular vein under direct, real-time ultrasound guidance. Ultrasound image documentation was performed. After securing guidewire access, an 8 Fr dilator was placed. A J-wire was kinked to measure appropriate catheter length. A Palindrome tunneled hemodialysis catheter measuring 19 cm from tip to cuff was chosen for placement. This was  tunneled in a retrograde fashion from the chest wall to the venotomy incision. At the venotomy, serial dilatation was performed and a 15 Fr peel-away sheath was placed over a guidewire. The catheter was then placed through the sheath and the sheath removed. Final catheter positioning was confirmed and documented with a fluoroscopic spot image. The catheter was aspirated, flushed with saline, and injected with appropriate volume heparin dwells. The venotomy incision was closed with subcuticular 4-0 Vicryl. Dermabond was applied to the incision. The catheter exit site was secured with 0-Prolene retention sutures. Following tunnel catheter placement, the left jugular non tunneled temporary hemodialysis catheter was removed and manual compression applied over the catheter exit site. COMPLICATIONS: None.  No pneumothorax. 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 There is a patent external jugular vein present which empties directly into the medial subclavian vein. The medial aspect of the external jugular vein near its juncture with the subclavian vein was accessed for catheter placement. After new tunneled catheter placement, the tip lies in the right atrium. The catheter aspirates normally and is ready for immediate use. IMPRESSION: Placement  of tunneled hemodialysis catheter via the right external jugular vein. The catheter tip lies in the right atrium. The catheter is ready for immediate use. After placement, the non tunneled left jugular temporary dialysis catheter was removed. Electronically Signed   By: Aletta Edouard M.D.   On: 06/18/2020 15:21   IR THROMBECTOMY AV FISTULA W/THROMBOLYSIS INC/SHUNT/IMG LEFT  Result Date: 06/18/2020 CLINICAL DATA:  Occluded left upper arm brachial to axillary dialysis graft. EXAM: 1. ULTRASOUND GUIDANCE FOR VASCULAR ACCESS OF LEFT UPPER ARM DIALYSIS GRAFT. 2. ABORTED AND PARTIALLY COMPLETED DIALYSIS FISTULA DECLOT PROCEDURE WITH TWO SEPARATE GRAFT  ACCESS SITES. 3. VENOUS ANGIOPLASTY OF DIALYSIS GRAFT AND VENOUS OUTFLOW. ANESTHESIA/SEDATION: Moderate (conscious) sedation was employed during this procedure. A total of Versed 2.0 mg and Fentanyl 125 mcg was administered intravenously. Moderate Sedation Time: 108 minutes. The patient's level of consciousness and vital signs were monitored continuously by radiology nursing throughout the procedure under my direct supervision. CONTRAST:  55mL OMNIPAQUE IOHEXOL 300 MG/ML  SOLN MEDICATIONS: 2 mg tPA, 4000 U IV heparin FLUOROSCOPY TIME:  9 minutes and 18 seconds.  15.5 mGy. PROCEDURE: The procedure, risks, benefits, and alternatives were explained to the patient. Questions regarding the procedure were encouraged and answered. The patient understands and consents to the procedure. A time-out was performed prior to initiating the procedure. Ultrasound interrogation of the left arm dialysis graft was performed. The left upper arm dialysis graft was prepped with chlorhexidine in a sterile fashion, and a sterile drape was applied covering the operative field. A sterile gown and sterile gloves were used for the procedure. Local anesthesia was provided with 1% Lidocaine. Preliminary ultrasound was performed of the dialysis fistula. Ultrasound-guided antegrade graft access was performed with a micropuncture set. Ultrasound image documentation was performed. 1 mg of t-PA was instilled. A 6 French sheath was placed over a guidewire. A diagnostic catheter was advanced and contrast injection performed at the level of patent venous outflow. Outflow venography was also performed via the catheter with visualization of central venous outflow. Angiojet thrombectomy was performed throughout the left upper arm graft and at the level of a venous outflow stent. Balloon angioplasty was performed at the level of the venous outflow stent and within the dialysis graft with a 7 mm x 4 cm Conquest balloon. Retrograde access of the dialysis  graft was then performed with a micropuncture set under ultrasound guidance. A 6 French sheath was placed. Thrombectomy across the arterial anastomosis and within the proximal graft was then performed with a 4-French Fogarty balloon catheter. Several passes were made with the Fogarty catheter. Suction thrombectomy was then performed through both sheaths. Graft patency was reassessed with angiography. This was followed by additional Angiojet thrombectomy in both antegrade and retrograde directions and additional balloon angioplasty with the 7 mm balloon. Ultimately, the procedure was aborted and both sheaths removed with application Ethilon pursestring sutures. COMPLICATIONS: None FINDINGS: Ultrasound confirms thrombosis of the graft. The arterial anastomosis appeared patent by ultrasound. Just beyond the arterial anastomosis, there is dilatation of the proximal graft followed by thrombus causing occlusion of the graft. After graft access, there also is visualization of an elongated stent which extends from the distal graft, across the venous anastomosis and into the outflow axillary vein. The entire stent is occluded and thrombus extends into the axillary vein beyond the stent. After initial mechanical thrombectomy and Fogarty thrombectomy, the proximal aspect of the dialysis graft demonstrated normal patency including the arterial anastomosis and approximately half of the dialysis graft. The lumen  of the indwelling venous outflow stent was also able to be dilated. However, there was an area of resistant stenosis within the midportion of the graft that could be dilated but demonstrated persistent collapse and recurrent occlusion suggestive of focally deteriorated graft material. It was not felt that a covered stent could be placed in this region as this is currently a segment that the graft would need to be accessed for dialysis access. At this point during the procedure, it was decided to abort further attempt at  percutaneous intervention and proceed with placement of a tunneled dialysis catheter for immediate dialysis needs. The patient will need eventual surgical revision of the dialysis graft versus new access. IMPRESSION: Lack of ability to maintain patency of an occluded left upper arm brachial to axillary vein dialysis graft primarily due to what appears to be a deteriorated and resistant segment within the midportion of the graft. An indwelling stent is present from the region of the distal graft into the outflow axillary vein. ACCESS: Due to inability to maintain patency of the occluded dialysis graft, a tunneled dialysis catheter was placed following the procedure. This procedure is dictated separately. It is not felt that repeat attempt at percutaneous declot would be fruitful. Recommend vascular surgical consultation for consideration of surgical revision of the current access versus placement of new access. Electronically Signed   By: Aletta Edouard M.D.   On: 06/18/2020 15:16    Medications: Infusions: . sodium chloride 10 mL/hr at 06/19/20 0844    Scheduled Medications: . [MAR Hold] alum & mag hydroxide-simeth  30 mL Oral Once  . [MAR Hold] amiodarone  200 mg Oral Daily  . [MAR Hold] aspirin EC  81 mg Oral Daily  . chlorhexidine  15 mL Mouth/Throat Once   Or  . mouth rinse  15 mL Mouth Rinse Once  . [MAR Hold] Chlorhexidine Gluconate Cloth  6 each Topical Daily  . [MAR Hold] darbepoetin (ARANESP) injection - DIALYSIS  60 mcg Intravenous Q Mon-HD  . [MAR Hold] dicyclomine  10 mg Oral BID  . [MAR Hold] gabapentin  100 mg Oral QHS  . [MAR Hold] isosorbide mononitrate  60 mg Oral Daily  . [MAR Hold] metoprolol tartrate  25 mg Oral BID  . [MAR Hold] mupirocin ointment  1 application Nasal BID  . [MAR Hold] sevelamer carbonate  1,600 mg Oral TID WC  . [MAR Hold] simvastatin  20 mg Oral QHS  . [MAR Hold] sodium chloride flush  10-40 mL Intracatheter Q12H    have reviewed scheduled and prn  medications.  Physical Exam: General:NAD, comfortable Heart:RRR, s1s2 nl Lungs: Clear b/l, no crackle Abdomen:soft, Non-tender, non-distended Extremities:No edema Dialysis Access: Left upper extremity AV access site has dressing applied, right IJ TDC placed. Shawna [name] 06/19/2020,10:10 AM  LOS: 5 days  Pager: [phone]

## 2020-06-19 NOTE — Anesthesia Procedure Notes (Addendum)
Anesthesia Regional Block: Supraclavicular block   Pre-Anesthetic Checklist: ,, timeout performed, Correct Patient, Correct Site, Correct Laterality, Correct Procedure, Correct Position, site marked, Risks and benefits discussed,  Surgical consent,  Pre-op evaluation,  At surgeon's request and post-op pain management  Laterality: Left  Prep: chloraprep       Needles:  Injection technique: Single-shot  Needle Type: Echogenic Stimulator Needle     Needle Length: 5cm  Needle Gauge: 22     Additional Needles:   Procedures:, nerve stimulator,,,,,,,  Narrative:  Start time: 06/19/2020 9:28 AM End time: 06/19/2020 9:32 AM Injection made incrementally with aspirations every 5 mL.  Performed by: Personally  Anesthesiologist: Albertha Ghee, MD  Additional Notes: Functioning IV was confirmed and monitors were applied.  A 63mm 22ga Arrow echogenic stimulator needle was used. Sterile prep and drape,hand hygiene and sterile gloves were used.  Negative aspiration and negative test dose prior to incremental administration of local anesthetic. The patient tolerated the procedure well.  Ultrasound guidance: relevent anatomy identified, needle position confirmed, local anesthetic spread visualized around nerve(s), vascular puncture avoided.  Image printed for medical record.

## 2020-06-19 NOTE — Anesthesia Preprocedure Evaluation (Signed)
Anesthesia Evaluation  Patient identified by MRN, date of birth, ID band Patient awake    Reviewed: Allergy & Precautions, H&P , NPO status , Patient's Chart, lab work & pertinent test results  Airway Mallampati: II   Neck ROM: full    Dental   Pulmonary neg pulmonary ROS,    breath sounds clear to auscultation       Cardiovascular hypertension, + CAD, + Past MI, + Cardiac Stents, + CABG, +CHF and + DOE   Rhythm:regular Rate:Normal     Neuro/Psych PSYCHIATRIC DISORDERS Anxiety    GI/Hepatic PUD, GERD  ,  Endo/Other    Renal/GU ESRF and DialysisRenal disease     Musculoskeletal  (+) Arthritis ,   Abdominal   Peds  Hematology  (+) Blood dyscrasia, anemia ,   Anesthesia Other Findings   Reproductive/Obstetrics                             Anesthesia Physical Anesthesia Plan  ASA: IV  Anesthesia Plan: MAC and Regional   Post-op Pain Management:    Induction: Intravenous  PONV Risk Score and Plan: 2 and Propofol infusion and Treatment may vary due to age or medical condition  Airway Management Planned: Simple Face Mask  Additional Equipment:   Intra-op Plan:   Post-operative Plan:   Informed Consent: I have reviewed the patients History and Physical, chart, labs and discussed the procedure including the risks, benefits and alternatives for the proposed anesthesia with the patient or authorized representative who has indicated his/her understanding and acceptance.       Plan Discussed with: CRNA, Anesthesiologist and Surgeon  Anesthesia Plan Comments:         Anesthesia Quick Evaluation

## 2020-06-19 NOTE — Op Note (Addendum)
Date: June 19, 2020  Preoperative diagnosis: Occluded and malfunctioning left upper arm AV graft  Postoperative diagnosis: Same  Procedure: Revision of left upper arm AV graft with interposition jump graft using 6 mm Gore limb  Surgeon: Dr. Marty Heck, MD  Assistant: Paulo Fruit, PA  Indications: Patient is 79 year old female that was seen in consultation yesterday with occluded left upper arm AV graft placed in 2018.  She has had multiple secondary interventions including stenting of the venous outflow.  She presented with an occluded graft earlier in the week and IR attempted percutaneous thrombectomy that was unsuccessful.  Vascular surgery was then consulted.  She presents today after risk benefits discussed.  AN assistant was needed for exposure and to expedite the case.  Findings: On my review she had a appreciable thrill at the artery anastomosis and the upper arm graft and central structures also appeared patent.  The mid segment of the graft appeared to be the diseased segment based on IR pictures.  I subsequently performed a jump graft from the antecubital fossa sewn end-to-end to her existing graft and then tunneled a new limb medial to her existing graft in the upper arm and anastomosed end-to-end to the more proximal portion of the old graft in the upper arm.  Excellent thrill at completion.  Anesthesia: Regional with MAC  Details: Patient was taken to the operating room after informed consent was obtained.  She was placed on the operative table in supine position.  Anesthesia was induced.  Left arm prepped draped under sterile fashion.  Subsequently reviewed her images in the OR.  We elected to perform jump graft from the existing graft just above the antecubital fossa where there was appreciable thrill.  I performed transverse incision here and then dissected down got circumferential control of the old graft with Bovie cautery and placed a vessel loop.  I then  evaluated with ultrasound and most of the mid segment of the graft in the arm appeared very diseased.  The upper arm stent was identified where it went across the venous anastomosis and this all appeared patent.  I then elected to perform a jump graft to just before the venous stent.  I then placed another transverse incision on the upper arm and dissected down Bovie cautery got circumferential control the graft.  Patient was given 5000 units of IV heparin.  I then clamped proximal graft with a fistula clamp and also clamp just before the existing stent in the upper arm graft.  Ultimately the graft was transected in each location.  I then elected to tunnel medial from her existing upper arm graft and a new 6 mm graft was tunneled maintaining good orientation.  I then sewed end-to-end anastomosis to each end of her existing graft with 6-0 Prolene parachute technique.  I did pass a #4 Fogarty on the venous end of the graft and retrieved no clot and its passed easily with no resistance with good back bleeding.  Everything was de-aired prior to completion.  We had good thrill.  10 mg protamine was given for reversal.  Incisions were irrigated out both incisions these were closed with 3-0 Vicryl 4-0 Monocryl Dermabond.  Taken to PACU in stable condition.  Complication: None  Condition: Stable  Marty Heck, MD [facility] Office: [place]

## 2020-06-19 NOTE — Progress Notes (Signed)
Family Medicine Teaching Service Daily Progress Note Intern Pager: [phone]  Patient name: Shawna [name] Medical record number: [id number] Date of birth: 1940-08-22 Age: 79 y.o. Gender: female  Primary Care Provider: [name], MD Consultants: Nephro, vascular, IR Code Status: Full   Pt Overview and Major Events to Date:  12/4 -admitted to [facility], HD 12/8 -aborted dialysis graft declot procedure thrombectomy angioplasty   Assessment and Plan: Alexes K Moyeris a 79 y.o.femalepresenting with Clotted AVF. PMH is significant forESRD on HD(MWF), HTN, HLD, GERD, CADs/p CABG in 2013 and chronic occlusion of D1 stent and PDA (75% occlusion on 1/20) with patent grafts, Afibnot on anticoagulation due to prior GI bleed, Anxiety, Anemia, and Chronic GI bleed.  Acute Asymptomatic Anemia Anemia of Chronic Kidney Disease: Hbg 9.0, stable. - Aranesp per nephrology with HD. - Q4 vitals  -Continue to monitordailyCBC's. -Consult GI if Hbdropsagain.  Hypertensive Emergency2/1missed HDdue to Clotted AVF Essential HTN BP range in the last 24h 105-137/33-50. - continue HD per nephrology - monitor BP's  - Vascular consulted, appreciate recs,  - Keep oxygen saturation more than 92%  Acute hypoxic respiratory failure,resolved Per charting, patient has been satting in the high 90s on room air today.  -Continue cardiac monitoring -EKG as needed -Strict I/O encourage using BIPAP at Night.  Chest tightness Elevated troponin: resolved - Cardiology follow, appreciate recs - Holdhome aspirin. - Continue home Imdur  - Repeat EKG if recurs - Nitroglycerin sublingual 0.4 mg PRN for chest tightness -Continue to monitor for chest pain.  ESRD on HD MWF Cr 7.44, BUN 51 -Nephrology consulted, appreciate recs -Avoid nephrotoxic meds -Daily RFP -Continue Renvela.  HLD -Continue home simvastatin.  GERD -Hold Prilosec  Chronic Diastolic HF  -HTN control as above    Atrial fibrillation  TSH Not on anticoagulation due to hx of GIB. -Continue amiodarone 200 mg daily -Continue cardiac monitoring  Anxiety Disorder -Continue Xanax As needed for sleep.  Hx of GI bleed from colonic AVM's -Continue to monitor for bleeding. -Continue Aspirin  FEN/GI: Renal Diet Prophylaxis: SCD's    Status is: Inpatient  Remains inpatient appropriate because:Ongoing diagnostic testing needed not appropriate for outpatient work up   Dispo:  Patient From: Home  Planned Disposition: Home  Expected discharge date: 06/20/2020  Medically stable for discharge: No   Subjective:  Patient reports that she "can't feel my arm, I see it, but I don't feel it" as she had just come back from her procedure with vascular surgery. Otherwise she has no complaints since she is eating her lunch now.  Objective: Temp:  [97.2 F (36.2 C)-98.5 F (36.9 C)] 97.8 F (36.6 C) (12/10 1217) Pulse Rate:  [63-83] 63 (12/10 1217) Resp:  [14-20] 16 (12/10 1217) BP: (102-137)/(33-50) 102/41 (12/10 1217) SpO2:  [93 %-100 %] 93 % (12/10 1217) Weight:  [62 kg] 62 kg (12/10 0849) Physical Exam: Gen: NAD, laying in bed eating a sandwich CV: RRR, no m/r/g appreciated Pulm: CTAB, normal WOB Abd: soft, non-tender, non-distended  Laboratory: Recent Labs  Lab 06/16/20 0344 06/17/20 1941 06/18/20 0338 06/19/20 0252  WBC 7.8  --  11.5* 9.2  HGB 9.8* 10.5* 9.3* 9.0*  HCT 31.2* 33.6* 29.8* 28.6*  PLT 190  --  208 176   Recent Labs  Lab 06/17/20 0116 06/18/20 0338 06/19/20 0252  NA 138 138 135  K 4.5 5.3* 3.6  CL 99 100 93*  CO2 21* 19* 27  BUN 72* 110* 51*  CREATININE 9.87* 13.37* 7.44*  CALCIUM 9.3  8.8* 8.4*  GLUCOSE 143* 125* 114*     Imaging/Diagnostic Tests: No results found.   Rise Patience, DO 06/19/2020, 1:12 PM PGY-, [place] Intern pager: [phone], text pages welcome

## 2020-06-20 DIAGNOSIS — I208 Other forms of angina pectoris: Secondary | ICD-10-CM

## 2020-06-20 DIAGNOSIS — I2583 Coronary atherosclerosis due to lipid rich plaque: Secondary | ICD-10-CM

## 2020-06-20 LAB — RENAL FUNCTION PANEL
Albumin: 2.6 g/dL — ABNORMAL LOW (ref 3.5–5.0)
Anion gap: 11 (ref 5–15)
BUN: 29 mg/dL — ABNORMAL HIGH (ref 8–23)
CO2: 25 mmol/L (ref 22–32)
Calcium: 8.7 mg/dL — ABNORMAL LOW (ref 8.9–10.3)
Chloride: 101 mmol/L (ref 98–111)
Creatinine, Ser: 5.18 mg/dL — ABNORMAL HIGH (ref 0.44–1.00)
GFR, Estimated: 8 mL/min — ABNORMAL LOW (ref >60)
Glucose, Bld: 103 mg/dL — ABNORMAL HIGH (ref 70–99)
Phosphorus: 4.6 mg/dL (ref 2.5–4.6)
Potassium: 4.6 mmol/L (ref 3.5–5.1)
Sodium: 137 mmol/L (ref 135–145)

## 2020-06-20 LAB — CBC
HCT: 27.4 % — ABNORMAL LOW (ref 36.0–46.0)
Hemoglobin: 8.4 g/dL — ABNORMAL LOW (ref 12.0–15.0)
MCH: 34.1 pg — ABNORMAL HIGH (ref 26.0–34.0)
MCHC: 30.7 g/dL (ref 30.0–36.0)
MCV: 111.4 fL — ABNORMAL HIGH (ref 80.0–100.0)
Platelets: 164 10*3/uL (ref 150–400)
RBC: 2.46 MIL/uL — ABNORMAL LOW (ref 3.87–5.11)
RDW: 22.4 % — ABNORMAL HIGH (ref 11.5–15.5)
WBC: 11.2 10*3/uL — ABNORMAL HIGH (ref 4.0–10.5)
nRBC: 0.2 % (ref 0.0–0.2)

## 2020-06-20 LAB — GLUCOSE, CAPILLARY: Glucose-Capillary: 120 mg/dL — ABNORMAL HIGH (ref 70–99)

## 2020-06-20 MED ORDER — SEVELAMER CARBONATE 800 MG PO TABS: 1600.0000 mg | ORAL_TABLET | Freq: Three times a day (TID) | ORAL | 1 refills | Status: DC

## 2020-06-20 NOTE — Progress Notes (Signed)
   06/20/20 1219  Vitals  Pulse Rate 64  Pulse Rate Source Monitor  MEWS COLOR  MEWS Score Color Green  Oxygen Therapy  SpO2 98 %  O2 Device Room Air  MEWS Score  MEWS Temp 0  MEWS Systolic 0  MEWS Pulse 0  MEWS RR 0  MEWS LOC 0  MEWS Score 0

## 2020-06-20 NOTE — Progress Notes (Addendum)
Progress Note  Patient Name: Shawna [name] Date of Encounter: 06/20/2020  [facility] Cardiologist: Normand Sloop   Subjective   79 yo female with a history of coronary artery disease, CABG, hypertension, hyperlipidemia, paroxysmal atrial fibrillation, nonsustained ventricular tachycardia, ASD.  She was evaluated for an episode of chest discomfort at the request of Dr. Vikki Ports.  She has known episodes of angina.  She typically has episodes of chest pain when her blood pressure goes up.    No real complaints today on exam  Inpatient Medications    Scheduled Meds: . alum & mag hydroxide-simeth  30 mL Oral Once  . amiodarone  200 mg Oral Daily  . aspirin EC  81 mg Oral Daily  . Chlorhexidine Gluconate Cloth  6 each Topical Daily  . darbepoetin (ARANESP) injection - DIALYSIS  60 mcg Intravenous Q Mon-HD  . dicyclomine  10 mg Oral BID  . gabapentin  100 mg Oral QHS  . isosorbide mononitrate  60 mg Oral Daily  . sevelamer carbonate  1,600 mg Oral TID WC  . simvastatin  20 mg Oral QHS  . sodium chloride flush  10-40 mL Intracatheter Q12H   Continuous Infusions:  PRN Meds: heparin sodium (porcine), loratadine, nitroGLYCERIN, sodium chloride flush   Vital Signs    Vitals:   06/20/20 0409 06/20/20 0748 06/20/20 0944 06/20/20 0955  BP: (!) 105/30 (!) 110/30 (!) 129/41 (!) 110/38  Pulse: (!) 59 63 62 61  Resp: 17 20 18    Temp: 99.1 F (37.3 C) 99.9 F (37.7 C) 98.9 F (37.2 C)   TempSrc: Oral Oral Oral   SpO2: 100% 100% 100%   Weight: 63.8 kg     Height:        Intake/Output Summary (Last 24 hours) at 06/20/2020 1115 Last data filed at 06/20/2020 1011 Gross per 24 hour  Intake 737.67 ml  Output 771 ml  Net -33.33 ml   Last 3 Weights 06/20/2020 06/19/2020 06/19/2020  Weight (lbs) 140 lb 10.5 oz 136 lb 7.4 oz 138 lb 7.2 oz  Weight (kg) 63.8 kg 61.9 kg 62.8 kg      Telemetry    NSR  - Personally Reviewed  ECG    No new EKG to review - Personally  Reviewed  Physical Exam   GEN: Well nourished, well developed in no acute distress HEENT: Normal NECK: No JVD; No carotid bruits LYMPHATICS: No lymphadenopathy CARDIAC:RRR, no murmurs, rubs, gallops RESPIRATORY:  Clear to auscultation without rales, wheezing or rhonchi  ABDOMEN: Soft, non-tender, non-distended MUSCULOSKELETAL:  No edema; No deformity  SKIN: Warm and dry NEUROLOGIC:  Alert and oriented x 3 PSYCHIATRIC:  Normal affect    Labs    High Sensitivity Troponin:   Recent Labs  Lab 06/12/20 2136 06/13/20 0352 06/13/20 1125 06/17/20 2046  TROPONINIHS 59* 155* 124* 127*      Chemistry Recent Labs  Lab 06/17/20 0116 06/18/20 0338 06/19/20 0252 06/20/20 0551  NA 138 138 135 137  K 4.5 5.3* 3.6 4.6  CL 99 100 93* 101  CO2 21* 19* 27 25  GLUCOSE 143* 125* 114* 103*  BUN 72* 110* 51* 29*  CREATININE 9.87* 13.37* 7.44* 5.18*  CALCIUM 9.3 8.8* 8.4* 8.7*  ALBUMIN 2.8* 2.8*  --  2.6*  GFRNONAA 4* 3* 5* 8*  ANIONGAP 18* 19* 15 11     Hematology Recent Labs  Lab 06/18/20 0338 06/19/20 0252 06/20/20 0551  WBC 11.5* 9.2 11.2*  RBC 2.75* 2.62* 2.46*  HGB 9.3* 9.0* 8.4*  HCT 29.8* 28.6* 27.4*  MCV 108.4* 109.2* 111.4*  MCH 33.8 34.4* 34.1*  MCHC 31.2 31.5 30.7  RDW 22.5* 22.4* 22.4*  PLT 208 176 164    BNPNo results for input(s): BNP, PROBNP in the last 168 hours.   DDimer No results for input(s): DDIMER in the last 168 hours.   Radiology    No results found.  Cardiac Studies   none  Patient Profile     79 y.o. female admitted with chest pain - atypical   Assessment & Plan    1.  Chest pain:   -No further chest pain  -mildly elevated hsTrop but flat trend and likely related to ESRD and demand ischemia in setting of uncontrolled HTN and pulmonary edema -last cath showed patent grafts 05/2019 with 75% PDA -plan is not to proceed with repeat cath unless she has recurrent CP in setting of controlled BP -long acting nitrates were decreased to  allow BP for addition of BB but BP low this am so BB stopped with plans to start outpt if stable (she has had problems with hypotension during HD in the past)  2.   CAD  -she has extensive CAD with multiple stents all of which have closed - s/p CABG  -continue ASA, statin, long acting nitrates  3.  HTN:  - BP is controlled at 110/70mmHg this am   4.  ESRD:    -Had graft revision.   -per nephrology    I have spent a total of 35 minutes with patient reviewing cardiac cath from 07/2018, 2D echo , telemetry, EKGs, labs and examining patient as well as establishing an assessment and plan that was discussed with the patient.  > 50% of time was spent in direct patient care.    For questions or updates, please contact [place] Please consult [email or web address] for contact info under        Signed, Fransico Him, MD  06/20/2020, 11:15 AM

## 2020-06-20 NOTE — Anesthesia Postprocedure Evaluation (Signed)
Anesthesia Post Note  Patient: [name]  Procedure(s) Performed: REVISION OF LEFT UPPER ARM AV GRAFT WITH INTERPOSITION JUMP GRAFT USING 6MM GORE LIMB (Left Arm Upper)     Patient location during evaluation: PACU Anesthesia Type: Regional and MAC Level of consciousness: awake and alert Pain management: pain level controlled Vital Signs Assessment: post-procedure vital signs reviewed and stable Respiratory status: spontaneous breathing, nonlabored ventilation, respiratory function stable and patient connected to nasal cannula oxygen Cardiovascular status: stable and blood pressure returned to baseline Postop Assessment: no apparent nausea or vomiting Anesthetic complications: no   No complications documented.  Last Vitals:  Vitals:   06/20/20 0409 06/20/20 0748  BP: (!) 105/30 (!) 110/30  Pulse: (!) 59 63  Resp: 17 20  Temp: 37.3 C 37.7 C  SpO2: 100% 100%    Last Pain:  Vitals:   06/20/20 0748  TempSrc: Oral  PainSc:                  Stanton S

## 2020-06-20 NOTE — Progress Notes (Addendum)
   06/20/20 0944  Vitals  Temp 98.9 F (37.2 C)  Temp Source Oral  BP (!) 129/41  MAP (mmHg) 66  BP Location Right Arm  BP Method Automatic  Patient Position (if appropriate) Lying  Pulse Rate 62  Pulse Rate Source Monitor  Resp 18  MEWS COLOR  MEWS Score Color Green  Oxygen Therapy  SpO2 100 %  O2 Device Nasal Cannula  O2 Flow Rate (L/min) 2 L/min  MEWS Score  MEWS Temp 0  MEWS Systolic 0  MEWS Pulse 0  MEWS RR 0  MEWS LOC 0  MEWS Score 0   Manual b/p:  110/38    HR: 61      Metoprolol med on hold at this time per MD.

## 2020-06-20 NOTE — Progress Notes (Signed)
Discharge and medication education given with teach back with pt's daughter over the phone and pt's spouse present. Peripheral iv removed and dressing applied.  Pt is currently getting dressed with spouse at bedside.  Pt stated that she wants to eat dinner before she leaves.

## 2020-06-20 NOTE — TOC Transition Note (Signed)
Transition of Care [facility]) - CM/SW Discharge Note   Patient Details  Name: HALSTON FAIRCLOUGH MRN: [id number] Date of Birth: 03/01/1941  Transition of Care [facility]) CM/SW Contact:  Ella Bodo, RN Phone Number: 06/20/2020, 1:10 PM   Clinical Narrative: Patient medically stable for discharge home today with spouse, who can provide 24-hour assistance at discharge.  PT/OT recommending home health follow-up, and patient agreeable to services.  Referral to [facility] health for follow-up therapies.  Patient declines need for bedside commode at home, as she already has one.    Final next level of care: [place] Barriers to Discharge: No Barriers Identified   Patient Goals and CMS Choice Patient states their goals for this hospitalization and ongoing recovery are:: be able to do what she wants to do CMS Medicare.gov Compare Post Acute Care list provided to:: Patient Choice offered to / list presented to : Patient                      Discharge Plan and Services   Discharge Planning Services: CM Consult Post Acute Care Choice: Home Health            DME Agency: NA       HH Arranged: PT,OT [place] Agency: [place] Date [place]: 06/20/20 Time HH Agency Contacted: 1310 Representative spoke with at [place]: [place] ([place]) Interventions     Readmission Risk Interventions Readmission Risk Prevention Plan 06/15/2020 06/18/2019 12/27/2018  Transportation Screening Complete Complete Complete  PCP or Specialist Appt within 3-5 Days - - Complete  HRI or [place] - - Complete  Social Work Consult for [place] Planning/Counseling - - Complete  Palliative Care Screening - - Not Applicable  Medication Review Press photographer) Complete Complete Complete  PCP or Specialist appointment within 3-5 days of discharge Complete - -  [place] or Home Care Consult Complete - -  SW Recovery  Care/Counseling Consult Complete Complete -  Palliative Care Screening Not Applicable Not Applicable -  [place] Not Applicable Not Applicable -  Some recent data might be hidden   Reinaldo Raddle, RN, BSN  Trauma/Neuro ICU Case Manager [phone]

## 2020-06-20 NOTE — Discharge Summary (Signed)
[facility] Discharge Summary  Patient name: Shawna [name] Medical record number: [id number] Date of birth: 25-Apr-1941 Age: 79 y.o. Gender: female Date of Admission: 06/12/2020  Date of Discharge: 06/20/20 Admitting Physician: Martyn Malay, MD  Primary Care Provider: [name], MD Consultants: Nephrology, Vascular Surgery, Cardiology  Indication for Hospitalization: Hypertension in the setting of clotted graft  Discharge Diagnoses/Problem List:  Confusion Acute asymptomatic anemia/anemia of chronic kidney disease Hypertensive emergency secondary to missed HD due to clotted AV fistula, essential hypertension Acute hypoxic respiratory failure Chest tightness with elevated troponin ESRD on HD HLD GERD HFpEF Atrial fibrillation Anxiety disorder History of GI bleed from colonic AVMs  Disposition: home  Discharge Condition: stable, improved  Discharge Exam:   Physical Exam:  General: 79 y.o. female in NAD, sitting up in bed Cardio: RRR no m/r/g, left upper arm graft with thrill Lungs: CTAB, no wheezing, no rhonchi, no crackles, no IWOB on RA Skin: warm and dry Extremities: No edema Neuro: A&O to self, location, situation   Brief Hospital Course:  Shawna [name] is a 79 y.o. female presenting with Clotted AVF. PMH is significant for ESRD on HD (MWF), HTN, HLD, GERD, CAD s/p CABG in 2013 and chronic occlusion of D1 stent and PDA (75% occlusion on 1/20) with patent grafts, Afib not on anticoagulation due to prior GI bleed, Anxiety, Anemia, and Chronic GI bleed.   Acute Asymptomatic Anemia  Anemia of Chronic Kidney Disease: Pt Hb dropped from 8.4 at admission to 6.9. Denied any bleeding per rectum or black tarry stools.Pt received 1 units pf PRBC's. Post transfusion Hb was 8.0. FOBT negative on 12/4.  H/o GI bleed and angiodysplasia. We followed Hb daily. At discharge the Hb stable at 8.4.  Hypertensive Emergency due to missed HD due to Clotted AVF   Essential HTN BP's initially with systolic of 287'O. temp HD catheter placed in ED and urgent HD on 12/4. Pt was followed by Nephrology and got regular HD. Pt's BP improved. Vascular surgery consulted and completed dialysis graft declot procedure with thrombectomy and angioplasty but it was aborted and unable to maintain graft patency due to persistent occlusion in segment of the mid graft.  Right E J  HD catheter placed and left neck temporary catheter removed.  Pt had Revision of left upper arm AV graft with interposition jump graft using 6 mm Gore limb  on 06/19/20.   Chest tightness  Elevated troponin: resolved Patient complained of chest pain Off and on which was improved with Nitro. EKG unchanged from previous. Troponin's mildly elevated to to 59 and then 155 but then downtrend to 124. (06/13/20). Troponins repeated again on 06/17/20 showed mildly elevated to 127. H/o CAD s/p CABG 2013, DES 2019. We continued her home Aspirin and Imdur.  Cardiology consulted. Recommended starting metoprolol 25mg  BID, but patient had soft Bps, therefore this was held on day of discharge.  On day of discharge her CP had resolved and her vitals were stable, MAP on d/c was 66.  Should f/u with cardiology as outpatient.    ESRD on HD Nephrology consulted. Pt got regular HD during her stay in the Hospital.  High TSH  Pt's TSH high at 5.792. Recommended repeat TSH in 4-6 weeks after discharge.  The patient's other chronic medical problems were addressed throughout hospitalization without changes to home regimen.  Vitals were stable at the time of discharge.  Recommendations for follow up:  1. Repeat TSH in 4-6 weeks as outpatient due  to TSH 5.79  2. Patient has anemia, with Hgb 8.4 on discharge.  She should follow up with GI as an outpatient 3. TDC will need to be used for the next month. 4. Metoprolol 25mg  BID was recommended by cardiology, but was held on discharge due to soft Bps.  Restart as  tolerated. 5. Xanax and Flexeril held on discharge.  Would not continue given age.  Significant Procedures: 12/10 Left upper arm AV graft revision  Significant Labs and Imaging:  Recent Labs  Lab 06/18/20 0338 06/19/20 0252 06/20/20 0551  WBC 11.5* 9.2 11.2*  HGB 9.3* 9.0* 8.4*  HCT 29.8* 28.6* 27.4*  PLT 208 176 164   Recent Labs  Lab 06/15/20 0517 06/16/20 0344 06/17/20 0116 06/18/20 0338 06/19/20 0252 06/20/20 0551  NA 140 138 138 138 135 137  K 4.2 4.9 4.5 5.3* 3.6 4.6  CL 100 101 99 100 93* 101  CO2 22 23 21* 19* 27 25  GLUCOSE 90 136* 143* 125* 114* 103*  BUN 70* 40* 72* 110* 51* 29*  CREATININE 11.65* 7.76* 9.87* 13.37* 7.44* 5.18*  CALCIUM 8.4* 9.5 9.3 8.8* 8.4* 8.7*  PHOS 8.0* 5.4* 6.3* 7.9*  --  4.6  ALBUMIN 2.4* 2.9* 2.8* 2.8*  --  2.6*   DG Chest Port 1 View  Result Date: 06/13/2020 CLINICAL DATA:  Central line placement EXAM: PORTABLE CHEST 1 VIEW COMPARISON:  June 12, 2020 FINDINGS: Heart size is enlarged but unchanged from prior study. The patient is status post prior CABG. There is a new left-sided central venous catheter with tip projecting over the left brachiocephalic vein. There is no left-sided pneumothorax. There are small to moderate-sized bilateral pleural effusions with adjacent airspace disease, favored to represent atelectasis. Aortic calcifications are noted. IMPRESSION: 1. New left-sided central venous catheter as above with no evidence for pneumothorax. 2. Small to moderate-sized bilateral pleural effusions with adjacent airspace disease, favored to represent atelectasis. Electronically Signed   By: Constance Holster M.D.   On: 06/13/2020 02:00   DG Chest Port 1 View  Result Date: 06/12/2020 CLINICAL DATA:  Evaluate for volume overload. EXAM: PORTABLE CHEST 1 VIEW COMPARISON:  May 13, 2020 FINDINGS: No pneumothorax. Scattered atelectasis in the bases. Mild pulmonary venous congestion without overt edema. No pneumothorax. No other acute  abnormalities. IMPRESSION: Cardiomegaly and pulmonary venous congestion without overt edema Bibasilar atelectasis. Electronically Signed   By: Dorise Bullion III M.D   On: 06/12/2020 18:28   Results/Tests Pending at Time of Discharge: None  Discharge Medications:  Allergies as of 06/20/2020      Reactions   Aspirin Other (See Comments)   High Doses Mess up her stomach; "makes my bowels have blood in them". Takes 81 mg EC Aspirin    Penicillins Other (See Comments)   SYNCOPE? , "makes me real weak when I take it; like I'll pass out" Has patient had a PCN reaction causing immediate rash, facial/tongue/throat swelling, SOB or lightheadedness with hypotension: Yes Has patient had a PCN reaction causing severe rash involving mucus membranes or skin necrosis: no Has patient had a PCN reaction that required hospitalization no Has patient had a PCN reaction occurring within the last 10 years: no If all of the above   Amlodipine Swelling   Bactrim [sulfamethoxazole-trimethoprim] Rash   Contrast Media [iodinated Diagnostic Agents] Itching   Iron Itching, Other (See Comments)   "they gave me iron in dialysis; had to give me Benadryl cause I had to have the iron" (05/02/2012)  Nitrofurantoin Hives   Tylenol [acetaminophen] Itching, Other (See Comments)   Makes her feet on fire per pt   Gabapentin Other (See Comments)   Unknown reaction   Hydralazine Itching   Dexilant [dexlansoprazole] Other (See Comments)   Upset stomach   Levaquin [levofloxacin In D5w] Rash   Morphine And Related Itching, Other (See Comments)   Itching in feet   Plavix [clopidogrel Bisulfate] Rash   Protonix [pantoprazole Sodium] Rash   Venofer [ferric Oxide] Itching, Other (See Comments)   Patient reports using Benadryl prior to doses as [place]      Medication List    STOP taking these medications   ALPRAZolam 0.5 MG tablet Commonly known as: XANAX   cyclobenzaprine 5 MG tablet Commonly known as:  FLEXERIL   ondansetron 4 MG disintegrating tablet Commonly known as: ZOFRAN-ODT     TAKE these medications   albuterol 108 (90 Base) MCG/ACT inhaler Commonly known as: VENTOLIN HFA Inhale 2 puffs into the lungs every 6 (six) hours as needed for wheezing or shortness of breath.   amiodarone 200 MG tablet Commonly known as: PACERONE Take 1 tablet (200 mg total) by mouth daily. Starting July 2   aspirin EC 81 MG tablet Take 81 mg by mouth daily.   dicyclomine 10 MG capsule Commonly known as: BENTYL Take 10 mg by mouth 2 (two) times daily.   fluticasone 50 MCG/ACT nasal spray Commonly known as: FLONASE Place 1 spray into both nostrils at bedtime as needed for allergies.   gabapentin 100 MG capsule Commonly known as: NEURONTIN Take 100 mg by mouth daily.   isosorbide mononitrate 60 MG 24 hr tablet Commonly known as: IMDUR Take 1 tablet (60 mg total) by mouth 2 (two) times daily.   lidocaine-prilocaine cream Commonly known as: EMLA Apply 1 application topically every Monday, Wednesday, and Friday. Prior to dialysis   loratadine 10 MG tablet Commonly known as: CLARITIN Take 1 tablet (10 mg total) by mouth daily as needed for allergies.   multivitamin Tabs tablet Take 1 tablet by mouth daily.   nitroGLYCERIN 0.4 MG SL tablet Commonly known as: NITROSTAT Place 1 tablet (0.4 mg total) under the tongue every 5 (five) minutes as needed for chest pain.   nystatin-triamcinolone cream Commonly known as: MYCOLOG II Apply 1 application topically 2 (two) times daily. What changed: when to take this   omeprazole 40 MG capsule Commonly known as: PRILOSEC Take 1 capsule (40 mg total) by mouth daily before breakfast.   sevelamer carbonate 800 MG tablet Commonly known as: RENVELA Take 2 tablets (1,600 mg total) by mouth 3 (three) times daily with meals. What changed: how much to take   simvastatin 20 MG tablet Commonly known as: ZOCOR TAKE 1 TABLET BY MOUTH ONCE DAILY AT  BEDTIME            Durable Medical Equipment  (From admission, onward)         Start     Ordered   06/18/20 0757  For home use only DME 3 n 1  Once        06/18/20 0756          Discharge Instructions: Please refer to Patient Instructions section of EMR for full details.  Patient was counseled important signs and symptoms that should prompt return to medical care, changes in medications, dietary instructions, activity restrictions, and follow up appointments.   Follow-Up Appointments:  Follow-up Information    [name], MD.   Specialty: [facility]  Medicine Why: The office will call patient Contact information: [place] [address] [phone]        Marty Heck, MD.   Specialty: Vascular Surgery Why: The office will call the patient with an appointment Contact information: [place] [address] [phone]               Cleophas Dunker, DO 06/20/2020, 11:20 AM PGY-3, [place]

## 2020-06-20 NOTE — Progress Notes (Signed)
FPTS Interim Progress Note  S: Paged by nurse regarding confusion worsening throughout the evening.  Per nurse, patient did not know where she was and was calling the nurse her niece, however was easily redirectable.  When I reached the bedside, she was awake and alert.  She was oriented to self; however she thought she was at home, the year was [place] was president.  SCDs placed on patient with help of nurse.    O: BP (!) 110/36 (BP Location: Right Arm)   Pulse 64   Temp 99.8 F (37.7 C) (Oral)   Resp 18   Ht 5\' 1"  (1.549 m)   Wt 61.9 kg   SpO2 100%   BMI 25.78 kg/m   POC glucose 120 General: Awake, alert, oriented to self only, no acute distress Respiratory: Normal work of breathing, no respiratory distress, lungs clear to auscultation bilaterally Cardiac: Regular rate and rhythm, S1 and S2 appreciated, no murmurs auscultated Extremities: Moving all extremities spontaneously and on command, no BLE edema, right upper arm with well approximated surgical incision sites without warmth, redness, edema, or discharge Neuro: Cranial nerves II through X grossly intact; EOM intact, smile even, mixes up words with phrase "you can teach an old dog new tricks" but can say "no ifs, ands or buts" without error; equal grip strength, BUE strength equal, BLE strength equal, follows commands.   Lines: Central line in right subclavian placed 12/10, site without drainage, swelling, or warmth; previous left IJ site covered, bandage without obvious blood or drainage.  A/P: -Continue to monitor -Redirect patient as able -Decline further labs at this time, will consider if patient changes   Ezequiel Essex, MD 06/20/2020, 1:09 AM PGY-1, [place] [facility] Service pager [phone]

## 2020-06-20 NOTE — Discharge Instructions (Signed)
[facility]  Discharge Instructions  AV Fistula or Graft Surgery for Dialysis Access  Please refer to the following instructions for your post-procedure care. Your surgeon or physician assistant will discuss any changes with you.  Activity  You may drive the day following your surgery, if you are comfortable and no longer taking prescription pain medication. Resume full activity as the soreness in your incision resolves.  Bathing/Showering  You may shower after you go home. Keep your incision dry for 48 hours. Do not soak in a bathtub, hot tub, or swim until the incision heals completely. You may not shower if you have a hemodialysis catheter.  Incision Care  Clean your incision with mild soap and water after 48 hours. Pat the area dry with a clean towel. You do not need a bandage unless otherwise instructed. Do not apply any ointments or creams to your incision. You may have skin glue on your incision. Do not peel it off. It will come off on its own in about one week. Your arm may swell a bit after surgery. To reduce swelling use pillows to elevate your arm so it is above your heart. Your doctor will tell you if you need to lightly wrap your arm with an ACE bandage.  Diet  Resume your normal diet. There are not special food restrictions following this procedure. In order to heal from your surgery, it is CRITICAL to get adequate nutrition. Your body requires vitamins, minerals, and protein. Vegetables are the best source of vitamins and minerals. Vegetables also provide the perfect balance of protein. Processed food has little nutritional value, so try to avoid this.  Medications  Resume taking all of your medications. If your incision is causing pain, you may take over-the counter pain relievers such as acetaminophen (Tylenol). If you were prescribed a stronger pain medication, please be aware these medications can cause nausea and constipation. Prevent  nausea by taking the medication with a snack or meal. Avoid constipation by drinking plenty of fluids and eating foods with high amount of fiber, such as fruits, vegetables, and grains.   Do not take Tylenol if you are taking prescription pain medications.  Follow up Your surgeon may want to see you in the office following your access surgery. If so, this will be arranged at the time of your surgery.  Please call us immediately for any of the following conditions:   Increased pain, redness, drainage (pus) from your incision site  Fever of 101 degrees or higher  Severe or worsening pain at your incision site  Hand pain or numbness.   Reduce your risk of vascular disease:   Stop smoking. If you would like help, call [facility] at 1-800-QUIT-NOW 726-500-0202) or [place] at [place] your cholesterol  Maintain a desired weight  Control your diabetes  Keep your blood pressure down  Dialysis  It will take several weeks to several months for your new dialysis access to be ready for use. Your surgeon will determine when it is okay to use it. Your nephrologist will continue to direct your dialysis. You can continue to use your Permcath until your new access is ready for use.   06/19/2020 Shawna [name] [id number] 30-Dec-1940  Surgeon(s): Marty Heck, MD  Procedure(s): REVISON OF LEFT ARM ARTERIOVENOUS GRAFT   May stick graft immediately   May stick graft on designated area only:   X Do not stick left upper extremity AV graft for 4  weeks    If you have any questions, please call the office at [phone].    We recommend that you stop taking xanax and flexeril as these can be sedating and dangerous, especially in someone your age.

## 2020-06-20 NOTE — Progress Notes (Signed)
   ASSESSMENT & PLAN:  Shawna [name] is a 79 y.o. female status post left upper extremity AVG revision with interposition bypass.  Continue HD via catheter for 4 weeks. OK to use graft after 4 weeks. If graft working well at that time, OK to remove catheter.  Call for questions.  SUBJECTIVE:  Left arm sore. No other complaints.  OBJECTIVE:  BP (!) 129/41 (BP Location: Right Wrist)   Pulse 64   Temp 99.2 F (37.3 C) (Oral)   Resp 18   Ht 5\' 1"  (1.549 m)   Wt 63.8 kg   SpO2 98%   BMI 26.58 kg/m   Intake/Output Summary (Last 24 hours) at 06/20/2020 1519 Last data filed at 06/20/2020 1011 Gross per 24 hour  Intake 250 ml  Output 740 ml  Net -490 ml    Constitutional: well appearing. no acute distress. Vascular: LUE AVG interposition with thrill  CBC Latest Ref Rng & Units 06/20/2020 06/19/2020 06/18/2020  WBC 4.0 - 10.5 K/uL 11.2(H) 9.2 11.5(H)  Hemoglobin 12.0 - 15.0 g/dL 8.4(L) 9.0(L) 9.3(L)  Hematocrit 36.0 - 46.0 % 27.4(L) 28.6(L) 29.8(L)  Platelets 150 - 400 K/uL 164 176 208     CMP Latest Ref Rng & Units 06/20/2020 06/19/2020 06/18/2020  Glucose 70 - 99 mg/dL 103(H) 114(H) 125(H)  BUN 8 - 23 mg/dL 29(H) 51(H) 110(H)  Creatinine 0.44 - 1.00 mg/dL 5.18(H) 7.44(H) 13.37(H)  Sodium 135 - 145 mmol/L 137 135 138  Potassium 3.5 - 5.1 mmol/L 4.6 3.6 5.3(H)  Chloride 98 - 111 mmol/L 101 93(L) 100  CO2 22 - 32 mmol/L 25 27 19(L)  Calcium 8.9 - 10.3 mg/dL 8.7(L) 8.4(L) 8.8(L)  Total Protein 6.5 - 8.1 g/dL - - -  Total Bilirubin 0.3 - 1.2 mg/dL - - -  Alkaline Phos 38 - 126 U/L - - -  AST 15 - 41 U/L - - -  ALT 0 - 44 U/L - - -   Estimated Creatinine Clearance: 7.4 mL/min (A) (by C-G formula based on SCr of 5.18 mg/dL (H)).  Shawna [name]. Shawna Breed, MD [facility] Phone Number: [phone] 06/20/2020 3:20 PM

## 2020-06-20 NOTE — Progress Notes (Signed)
Patient seen by Dr. Wendy Poet.  Now that blinds are open, patient is awake, alert, oriented, and wanting to discharge.  Last BP with MAP 66.  Nephrology has signed off.  Given low Bps will hold metoprolol on discharge and consider adding as outpt.  Per nephro, okay to d/c from renal perspective.    Will place discharge orders.  Please see formal discharge summary for other recommendations.  [place] Constable, D.O.  PGY-3 Family Medicine  06/20/2020 11:13 AM

## 2020-06-20 NOTE — Progress Notes (Signed)
[facility] NEPHROLOGY PROGRESS NOTE  Assessment/ Plan: Pt is a 79 y.o. yo female ESRD on HD, CAD CABG admitted with clotted AV fistula, shortness of breath and hypertensive urgency.  OP HD: [name]   3.5h  300/600  61kg  2/2.5 bath  LUE AVF  Hep none  - hect 2.5 ug tiw  - epo 8000 IV tiw   #SOB/ hypertensive urgency/ pulm edema: Improved after dialysis.  Currently in room air and BP controlled.  UF during HD.  # Clotted LUE AVF: Had thrombectomy and balloon dilatation of AV fistula however unable to maintain graft patency due to persistent occlusion in mid graft.  Right IJ TDC was placed by IR on 12/8. Seen by vascular and underwent a revision of AV graft on 12/10.  Recommend to use [facility] for next 1 month.  #ESRD- usual HD MWF. Status post dialysis yesterday, tolerated well.  Plan for next HD on 12/13.  This can be done outpatient if patient is discharged.  # Chest pain: better. Prob due to HTN/ volume overload.  Per primary team.  #Hypertension/volume: Did not get a.m. medication because of n.p.o.  Monitor BP.  UF as tolerated.  # Anemia: Received blood transfusion. Continue aranesp on HD.  #Secondary hyperparathyroidism: Renvela dose increased, monitor phosphorus level.  Okay to discharge from renal perspective.  Subjective: Seen and examined.  Had vascular procedure and dialysis yesterday.  Using tunnel catheter.  Denies nausea vomiting chest pain shortness of breath.  Wants to go home. Objective Vital signs in last 24 hours: Vitals:   06/20/20 0409 06/20/20 0748 06/20/20 0944 06/20/20 0955  BP: (!) 105/30 (!) 110/30 (!) 129/41 (!) 110/38  Pulse: (!) 59 63 62 61  Resp: 17 20 18    Temp: 99.1 F (37.3 C) 99.9 F (37.7 C) 98.9 F (37.2 C)   TempSrc: Oral Oral Oral   SpO2: 100% 100% 100%   Weight: 63.8 kg     Height:       Weight change: -2.2 kg  Intake/Output Summary (Last 24 hours) at 06/20/2020 1008 Last data filed at 06/20/2020 0946 Gross  per 24 hour  Intake 667.67 ml  Output 771 ml  Net -103.33 ml       Labs: Basic Metabolic Panel: Recent Labs  Lab 06/17/20 0116 06/18/20 0338 06/19/20 0252 06/20/20 0551  NA 138 138 135 137  K 4.5 5.3* 3.6 4.6  CL 99 100 93* 101  CO2 21* 19* 27 25  GLUCOSE 143* 125* 114* 103*  BUN 72* 110* 51* 29*  CREATININE 9.87* 13.37* 7.44* 5.18*  CALCIUM 9.3 8.8* 8.4* 8.7*  PHOS 6.3* 7.9*  --  4.6   Liver Function Tests: Recent Labs  Lab 06/17/20 0116 06/18/20 0338 06/20/20 0551  ALBUMIN 2.8* 2.8* 2.6*   No results for input(s): LIPASE, AMYLASE in the last 168 hours. No results for input(s): AMMONIA in the last 168 hours. CBC: Recent Labs  Lab 06/15/20 0517 06/16/20 0344 06/17/20 1941 06/18/20 0338 06/19/20 0252 06/20/20 0551  WBC 5.2 7.8  --  11.5* 9.2 11.2*  HGB 8.1* 9.8*   < > 9.3* 9.0* 8.4*  HCT 25.9* 31.2*   < > 29.8* 28.6* 27.4*  MCV 108.4* 108.3*  --  108.4* 109.2* 111.4*  PLT 157 190  --  208 176 164   < > = values in this interval not displayed.   Cardiac Enzymes: No results for input(s): CKTOTAL, CKMB, CKMBINDEX, TROPONINI in the last 168 hours. CBG: Recent  Labs  Lab 06/13/20 1123 06/19/20 1129 06/19/20 1818 06/20/20 0052  GLUCAP 138* 81 111* 120*    Iron Studies: No results for input(s): IRON, TIBC, TRANSFERRIN, FERRITIN in the last 72 hours. Studies/Results: No results found.  Medications: Infusions:   Scheduled Medications: . alum & mag hydroxide-simeth  30 mL Oral Once  . amiodarone  200 mg Oral Daily  . aspirin EC  81 mg Oral Daily  . Chlorhexidine Gluconate Cloth  6 each Topical Daily  . darbepoetin (ARANESP) injection - DIALYSIS  60 mcg Intravenous Q Mon-HD  . dicyclomine  10 mg Oral BID  . gabapentin  100 mg Oral QHS  . isosorbide mononitrate  60 mg Oral Daily  . metoprolol tartrate  25 mg Oral BID  . sevelamer carbonate  1,600 mg Oral TID WC  . simvastatin  20 mg Oral QHS  . sodium chloride flush  10-40 mL Intracatheter Q12H     have reviewed scheduled and prn medications.  Physical Exam: General:NAD, comfortable Heart:RRR, s1s2 nl Lungs: Clear b/l, no crackle Abdomen:soft, Non-tender, non-distended Extremities:No edema Dialysis Access: Left upper extremity AV access has dressing applied, right IJ TDC placed. Jaremy Nosal Prasad Leah Thornberry 06/20/2020,10:08 AM  LOS: 6 days  Pager: [phone]

## 2020-06-20 NOTE — Progress Notes (Addendum)
Family Medicine Teaching Service Daily Progress Note Intern Pager: [phone]  Patient name: Shawna [name] Medical record number: [id number] Date of birth: 1940/09/01 Age: 79 y.o. Gender: female  Primary Care Provider: [name], MD Consultants: Nephro, vascular, IR Code Status: Full   Pt Overview and Major Events to Date:  12/4 -admitted to [facility], HD 12/8 -aborted dialysis graft declot procedure thrombectomy angioplasty 12/10 -revision of left upper arm AV graft with interposition jump graft  Assessment and Plan: [place] a 79 y.o.femalepresenting with Clotted AVF. PMH is significant forESRD on HD(MWF), HTN, HLD, GERD, CADs/p CABG in 2013 and chronic occlusion of D1 stent and PDA (75% occlusion on 1/20) with patent grafts, Afibnot on anticoagulation due to prior GI bleed, Anxiety, Anemia, and Chronic GI bleed.  Confusion  Patient reported to be confused overnight.  MAPs were low, see below.  Patient remains confused this AM.  Blinds opened during exam.  Patient able to state her name and her husband's name.  She was not oriented to time, location, or situation.  This could be 2/2 anesthesia, but also concerned for hypoperfusion in the setting of low Bps.  Manual BP checked this AM, MAP 66, now improved.  Could also consider delirium in the setting of hospitlization, now day 7. - likely 2/2 anesthesia - cont to monitor closely - delirium precautions, encourage family visitation for orientation - if not improving, would consider CT head  Acute Asymptomatic Anemia Anemia of Chronic Kidney Disease: Hgb 8.4 this AM, slowly downtrending, although had vascular procedure 12/10, see below.  Transfusion threshold 8 given CAD. - Aranesp per nephrology with HD. - Q4 vitals  -Continue to monitordailyCBC's. -Consult GI if Hbdropsagain.  Hypertensive Emergency2/35missed HDdue to Clotted AVF Essential HTN Bps now in hypotensive range.  MAPs overnight 40-55.  Most recent BP  129/41, MAP 66.  Patient is now 1 day s/p revision of left upper arm AV graft with interposition jump graft.  Thrill on exam. - continue HD per nephrology - monitor BP's closely - Vascular consulted, appreciate recs,  - Keep oxygen saturation more than 92% - hold metoprolol this AM, can give imdur  Acute hypoxic respiratory failure,resolved Patient had noted desaturation to 77 after procedure around 1800.  She was placed on [place] 2L.  Likely decreased in the setting of receiving anesthesia vs BiPAP noncompliance.  Comfortable on 2L per [place] this AM. -Continue cardiac monitoring -EKG as needed -Strict I/O - encourage using BIPAP at Night. - wean O2 as tolerated  Chest tightness Elevated troponin: resolved - Cardiology follow, appreciate recs - Holdhome aspirin. - Continue home Imdur  - Repeat EKG if recurs - Nitroglycerin sublingual 0.4 mg PRN for chest tightness -Continue to monitor for chest pain.  ESRD on HD MWF GFR 8, Cr 5.18, BUN 29.  K 4.6.  Last HD on 12/10. -Nephrology consulted, appreciate recs -Avoid nephrotoxic meds -Daily RFP -Continue Renvela.  HLD -Continue home simvastatin.  GERD -Hold Prilosec  Chronic Diastolic HF  -HTN control as above   Atrial fibrillation  Not on anticoagulation due to hx of GIB in setting of AVMs. -Continue amiodarone 200 mg daily -Continue cardiac monitoring  Anxiety Disorder -Continue Xanax As needed for sleep.  Hx of GI bleed from colonic AVM's -Continue to monitor for bleeding. -Continue Aspirin  FEN/GI: Renal Diet Prophylaxis: SCD's    Status is: Inpatient  Remains inpatient appropriate because:Ongoing diagnostic testing needed not appropriate for outpatient work up , will need improvement in mental status prior to  discharge   Dispo:  Patient From: Home  Planned Disposition: Home  Expected discharge date: 06/20/2020  Medically stable for discharge: No   Subjective:  Patient denies complaints  this AM.  Notes that she is not having any pain.  Does not know where she is or why she is here.  Objective: Temp:  [97.2 F (36.2 C)-99.9 F (37.7 C)] 99.9 F (37.7 C) (12/11 0748) Pulse Rate:  [59-71] 63 (12/11 0748) Resp:  [12-20] 20 (12/11 0748) BP: (73-137)/(22-58) 110/30 (12/11 0748) SpO2:  [77 %-100 %] 100 % (12/11 0748) Weight:  [61.9 kg-63.8 kg] 63.8 kg (12/11 0409)  Physical Exam:  General: 79 y.o. female in NAD, sitting up in bed Cardio: RRR no m/r/g, left upper arm graft with thrill Lungs: CTAB, no wheezing, no rhonchi, no crackles, no IWOB on 2L Skin: warm and dry Extremities: No edema Neuro: A&O to self only, knows her name, husband's name, is not sure where she is, why, or year/president   Laboratory: Recent Labs  Lab 06/18/20 0338 06/19/20 0252 06/20/20 0551  WBC 11.5* 9.2 11.2*  HGB 9.3* 9.0* 8.4*  HCT 29.8* 28.6* 27.4*  PLT 208 176 164   Recent Labs  Lab 06/18/20 0338 06/19/20 0252 06/20/20 0551  NA 138 135 137  K 5.3* 3.6 4.6  CL 100 93* 101  CO2 19* 27 25  BUN 110* 51* 29*  CREATININE 13.37* 7.44* 5.18*  CALCIUM 8.8* 8.4* 8.7*  GLUCOSE 125* 114* 103*     Imaging/Diagnostic Tests: No results found.   [place], DO 06/20/2020, 8:28 AM PGY-3, [place] Intern pager: [phone], text pages welcome

## 2020-06-22 ENCOUNTER — Encounter (HOSPITAL_COMMUNITY): Payer: Self-pay | Admitting: Vascular Surgery

## 2020-07-07 ENCOUNTER — Other Ambulatory Visit: Payer: Self-pay

## 2020-07-07 MED ORDER — SIMVASTATIN 20 MG PO TABS: 20.0000 mg | ORAL_TABLET | Freq: Every day | ORAL | 3 refills | Status: DC

## 2020-07-14 ENCOUNTER — Ambulatory Visit (INDEPENDENT_AMBULATORY_CARE_PROVIDER_SITE_OTHER): Payer: Self-pay | Admitting: Physician Assistant

## 2020-07-14 ENCOUNTER — Other Ambulatory Visit: Payer: Self-pay

## 2020-07-14 VITALS — BP 177/68 | HR 73 | Temp 98.5°F | Resp 20 | Ht 61.0 in | Wt 140.1 lb

## 2020-07-14 DIAGNOSIS — N186 End stage renal disease: Secondary | ICD-10-CM

## 2020-07-14 NOTE — Progress Notes (Signed)
POST OPERATIVE OFFICE NOTE    CC:  F/u for surgery  HPI:  This is a 80 y.o. female reported to the hospital with an occluded AV graft who is now s/p Revision of left upper arm AV graft with interposition jump graft using 6 mm Gore limb  on 06/19/20 by Dr. Carlis Abbott.    Pt returns today for follow up.  She denise loss of motor and sensation.    Allergies  Allergen Reactions  . Aspirin Other (See Comments)    High Doses Mess up her stomach; "makes my bowels have blood in them". Takes 81 mg EC Aspirin   . Penicillins Other (See Comments)    SYNCOPE? , "makes me real weak when I take it; like I'll pass out"  Has patient had a PCN reaction causing immediate rash, facial/tongue/throat swelling, SOB or lightheadedness with hypotension: Yes Has patient had a PCN reaction causing severe rash involving mucus membranes or skin necrosis: no Has patient had a PCN reaction that required hospitalization no Has patient had a PCN reaction occurring within the last 10 years: no If all of the above  . Amlodipine Swelling  . Bactrim [Sulfamethoxazole-Trimethoprim] Rash  . Contrast Media [Iodinated Diagnostic Agents] Itching  . Iron Itching and Other (See Comments)    "they gave me iron in dialysis; had to give me Benadryl cause I had to have the iron" (05/02/2012)  . Nitrofurantoin Hives  . Tylenol [Acetaminophen] Itching and Other (See Comments)    Makes her feet on fire per pt  . Gabapentin Other (See Comments)    Unknown reaction  . Hydralazine Itching  . Dexilant [Dexlansoprazole] Other (See Comments)    Upset stomach  . Levaquin [Levofloxacin In D5w] Rash  . Morphine And Related Itching and Other (See Comments)    Itching in feet  . Plavix [Clopidogrel Bisulfate] Rash  . Protonix [Pantoprazole Sodium] Rash  . Venofer [Ferric Oxide] Itching and Other (See Comments)    Patient reports using Benadryl prior to doses as [facility]    Current Outpatient Medications  Medication Sig  Dispense Refill  . albuterol (VENTOLIN HFA) 108 (90 Base) MCG/ACT inhaler Inhale 2 puffs into the lungs every 6 (six) hours as needed for wheezing or shortness of breath.    Marland Kitchen amiodarone (PACERONE) 200 MG tablet Take 1 tablet (200 mg total) by mouth daily. Starting July 2 90 tablet 3  . aspirin EC 81 MG tablet Take 81 mg by mouth daily.     . cyclobenzaprine (FLEXERIL) 5 MG tablet Take 5 mg by mouth 3 (three) times daily as needed.    . dicyclomine (BENTYL) 10 MG capsule Take 10 mg by mouth 2 (two) times daily.     . fluticasone (FLONASE) 50 MCG/ACT nasal spray Place 1 spray into both nostrils at bedtime as needed for allergies.     Marland Kitchen gabapentin (NEURONTIN) 100 MG capsule Take 100 mg by mouth daily.     . isosorbide mononitrate (IMDUR) 60 MG 24 hr tablet Take 1 tablet (60 mg total) by mouth 2 (two) times daily. 60 tablet 6  . lidocaine-prilocaine (EMLA) cream Apply 1 application topically every Monday, Wednesday, and Friday. Prior to dialysis    . loratadine (CLARITIN) 10 MG tablet Take 1 tablet (10 mg total) by mouth daily as needed for allergies. 30 tablet 0  . multivitamin (RENA-VIT) TABS tablet Take 1 tablet by mouth daily.    . nitroGLYCERIN (NITROSTAT) 0.4 MG SL tablet Place 1 tablet (0.4  mg total) under the tongue every 5 (five) minutes as needed for chest pain. 15 tablet 1  . nystatin-triamcinolone (MYCOLOG II) cream Apply 1 application topically 2 (two) times daily. (Patient taking differently: Apply 1 application topically at bedtime.) 30 g 0  . omeprazole (PRILOSEC) 40 MG capsule Take 1 capsule (40 mg total) by mouth daily before breakfast. 30 capsule 5  . sevelamer carbonate (RENVELA) 800 MG tablet Take 2 tablets (1,600 mg total) by mouth 3 (three) times daily with meals. 180 tablet 1  . simvastatin (ZOCOR) 20 MG tablet Take 1 tablet (20 mg total) by mouth at bedtime. 90 tablet 3   No current facility-administered medications for this visit.     ROS:  See HPI  Physical  Exam:    Incision:  Well healed Extremities:  Palpable thrill, palpable radial pulse and grip is 5/5    Assessment/Plan:  This is a 80 y.o. female who is s/p: Revision of left upper arm AV graft with interposition jump graft using 6 mm Gore limb  on 06/19/20 by Dr. Carlis Abbott.    The graft may be used for HD as of 07/21/19.  Once the graft is working well IR can remove the [place].  She will f/u PRN.  Roxy Horseman PA-C [facility] [phone]  Clinic MD:  Stanford Breed

## 2020-07-21 ENCOUNTER — Telehealth: Payer: Self-pay | Admitting: Cardiology

## 2020-07-21 NOTE — Telephone Encounter (Signed)
Patient states she is almost out of the Carvedilol and needs a refill.  She mentioned that she couldn't have any more refills until she sees a Tax adviser. She was seen last in [place].  Please advise this patient is to continue taking this medication.

## 2020-07-21 NOTE — Telephone Encounter (Signed)
Spoke with patient - per notes in epic - looks like Coreg has been d/c'd some time ago.  She did most recently have ED visit on 06/12/2020 at [facility].  Will send to provider to see if post hosp visit is warranted with extender.

## 2020-07-22 NOTE — Telephone Encounter (Signed)
From clinic note "She is no longer on Coreg given hypotension with low-dose Coreg in the past". Would stay off until our f/u next month to reevaluate   Shawna Abts MD

## 2020-07-23 NOTE — Telephone Encounter (Signed)
Patient notified and verbalized understanding. 

## 2020-07-30 ENCOUNTER — Telehealth (INDEPENDENT_AMBULATORY_CARE_PROVIDER_SITE_OTHER): Payer: Self-pay | Admitting: Internal Medicine

## 2020-07-30 NOTE — Telephone Encounter (Signed)
Patient left message stating she is having dark stools again - please advise - ph# [phone]

## 2020-07-30 NOTE — Telephone Encounter (Signed)
Per Mitzie patient was placed on Dr. Colman Cater Schedule for tomorrow 07/30/2020.

## 2020-07-31 ENCOUNTER — Other Ambulatory Visit: Payer: Self-pay

## 2020-07-31 ENCOUNTER — Encounter (INDEPENDENT_AMBULATORY_CARE_PROVIDER_SITE_OTHER): Payer: Self-pay | Admitting: Gastroenterology

## 2020-07-31 ENCOUNTER — Ambulatory Visit (INDEPENDENT_AMBULATORY_CARE_PROVIDER_SITE_OTHER): Payer: Medicare HMO | Admitting: Gastroenterology

## 2020-07-31 ENCOUNTER — Telehealth (INDEPENDENT_AMBULATORY_CARE_PROVIDER_SITE_OTHER): Payer: Self-pay

## 2020-07-31 VITALS — BP 142/85 | Ht 61.0 in | Wt 139.1 lb

## 2020-07-31 DIAGNOSIS — K6389 Other specified diseases of intestine: Secondary | ICD-10-CM | POA: Diagnosis not present

## 2020-07-31 DIAGNOSIS — K921 Melena: Secondary | ICD-10-CM | POA: Diagnosis not present

## 2020-07-31 DIAGNOSIS — D132 Benign neoplasm of duodenum: Secondary | ICD-10-CM

## 2020-07-31 MED ORDER — METRONIDAZOLE 500 MG PO TABS: 500.0000 mg | ORAL_TABLET | Freq: Three times a day (TID) | ORAL | 0 refills | Status: AC

## 2020-07-31 NOTE — Telephone Encounter (Signed)
I have mailed the patient her lab orders from her visit today to her home address.

## 2020-07-31 NOTE — Patient Instructions (Signed)
Perform blood workup Start Flagyl 500 mg TID for 10 days

## 2020-07-31 NOTE — Progress Notes (Signed)
Shawna [name], M.D. Gastroenterology & Hepatology [facility] [address] [place], [place] [address] Primary Care Physician: [name], MD [place] [address]  This is a telephone virtual visit.  It required patient-provider interaction for the medical decision making as documented below. The patient has consented and agreed to proceed with a Telehealth encounter given the current Coronavirus pandemic.  VIRTUAL VISIT NOTE Patient location: Home Provider location: Office  I will communicate my assessment and recommendations to the referring MD via EMR.  Problems 1.  Chronic bloating 2. iron deficiency anemia due to AVMs  History of Present Illness: Shawna [name] is a 80 y.o. female with PMH ESRD on dyalisis, congestive heart failure, history of colon cancer, TIA, PAF, history of AVMs, HLD, HTN, CAD status post CABG and stent placement, who presents for evaluation of chronic bloating and episodes of melena.  Patient states that a week ago she noticed worsening abdominal distention with some abdominal pain intermittently in the upper abdomen. She reports passing gas intermittently, more frequently than in the past.  Patient states that she noticed her pants were tighter due to her abdominal distention.  States that she has had bloating intermittently previously which was causing some discomfort but "never like this before".  Notably, the patient was treated for possible SIBO on March 2021 with improvement of her symptoms.   Patient also noticed very occasionally having some diarrhea 3-4 times a day but does not take any antidiarrheals. Also noticed scant tarry stools at the beginning of this week, but it has resolved on its own and her stools are clear. The patient denies having any nausea, vomiting, fever, chills, hematochezia, hematemesis,  diarrhea, jaundice, pruritus or weight loss.  Most recent available labs are from  06/20/2020, showing a hemoglobin of 8.4, MCV 111, normal platelets 164, although cell count 11.2 slightly elevated.  BUN was 29 and creatinine was 5.18.  Last EGD: 09/26/2019 - Normal esophagus. - Z-line regular, 34 cm from the incisors. - 3 cm hiatal hernia. - Normal stomach. - Normal duodenal bulb. - A single duodenal polyp. Biopsied. Duodenal adenoma without dysplasia. - Normal third portion of the duodenum. No polyp found.  Capsule endoscopy- 03/09/19- Bulbar duodenitis without stigmata of bleed. 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  Past Medical History: Past Medical History:  Diagnosis Date  . Acute on chronic respiratory failure with hypoxia ([place]) 10/10/2016  . Anxiety   . Arthritis   . AVM (arteriovenous malformation) of colon   . CAD (coronary artery disease)    a. s/p CABG in 2013 b. DES to D1 in 10/2016. c. cath in 07/2018 showing patent grafts with occlusion of D1 at prior stent site and progression of PDA disease --> medical management recommended  . Carotid artery disease ([place])    a. 37-90% LICA, 08/4095   . Chronic anemia   . Chronic bronchitis ([place])   . Chronic diastolic CHF (congestive heart failure) ([place])    a. 02/2012 Echo EF 60-65%, nl wall motion, Gr 1 DD, mod MR  . Colon cancer ([place]) 1992  . Esophageal stricture   . ESRD on hemodialysis ([place])    ESRD due to HTN, started dialysis 2011 and gets HD at [facility] with Dr Hinda Lenis on MWF schedule.  Access is LUA AVF as of Sept 2014.   Marland Kitchen GERD (gastroesophageal reflux disease)   . High cholesterol 12/2011  . History of  blood transfusion 07/2011; 12/2011; 01/2012 X 2; 04/2012  . History of gout   . History of lower GI bleeding   . Hypertension   . Iron deficiency anemia   . Mitral regurgitation    a. Moderate by echo, 02/2012  . Myocardial infarction ([place])   . NSVT (nonsustained ventricular tachycardia) ([place])   . Ovarian cancer ([place]) 1992  . PAF  (paroxysmal atrial fibrillation) ([place])   . Pneumonia ~ 2009  . PUD (peptic ulcer disease)   . TIA (transient ischemic attack)     Past Surgical History: Past Surgical History:  Procedure Laterality Date  . A/V SHUNTOGRAM Left 03/19/2019   Procedure: A/V SHUNTOGRAM;  Surgeon: Katha Cabal, MD;  Location: [place] [facility];  Service: Cardiovascular;  Laterality: Left;  . ABDOMINAL HYSTERECTOMY  1992  . APPENDECTOMY  06/1990  . AV FISTULA PLACEMENT  07/2009   left upper arm  . AV FISTULA PLACEMENT Right 09/06/2016   Procedure: RIGHT FOREARM ARTERIOVENOUS (AV) GRAFT;  Surgeon: Elam Dutch, MD;  Location: [facility] OR;  Service: Vascular;  Laterality: Right;  . AV FISTULA PLACEMENT N/A 02/24/2017   Procedure: INSERTION OF ARTERIOVENOUS (AV) GORE-TEX GRAFT ARM (BRACHIAL AXILLARY);  Surgeon: Katha Cabal, MD;  Location: [facility] ORS;  Service: Vascular;  Laterality: N/A;  . [place] Right 09/06/2016   Procedure: REMOVAL OF Right Arm ARTERIOVENOUS GORETEX GRAFT and Vein Patch angioplasty of brachial artery;  Surgeon: Angelia Mould, MD;  Location: [place];  Service: Vascular;  Laterality: Right;  . BIOPSY  09/26/2019   Procedure: BIOPSY;  Surgeon: Rogene Houston, MD;  [facility];  Service: Endoscopy;;  . COLON RESECTION  1992  . COLON SURGERY    . COLONOSCOPY N/A 03/09/2019   Procedure: COLONOSCOPY;  Surgeon: Rogene Houston, MD;  [facility];  Service: Endoscopy;  Laterality: N/A;  . CORONARY ANGIOPLASTY WITH STENT PLACEMENT  12/15/11   "2"  . CORONARY ANGIOPLASTY WITH STENT PLACEMENT  y/2013   "1; makes total of 3" (05/02/2012)  . CORONARY ARTERY BYPASS GRAFT  06/13/2012   Procedure: CORONARY ARTERY BYPASS GRAFTING (CABG);  Surgeon: Grace Isaac, MD;  Location: [place];  Service: Open Heart Surgery;  Laterality: N/A;  cabg x four;  using left internal mammary artery, and left leg greater saphenous vein harvested endoscopically  . CORONARY STENT  INTERVENTION N/A 10/13/2016   Procedure: Coronary Stent Intervention;  Surgeon: Troy Sine, MD;  Location: [place] CV LAB;  Service: Cardiovascular;  Laterality: N/A;  . DIALYSIS/PERMA CATHETER REMOVAL N/A 04/18/2017   Procedure: DIALYSIS/PERMA CATHETER REMOVAL;  Surgeon: Katha Cabal, MD;  Location: [place] CV [facility];  Service: Cardiovascular;  Laterality: N/A;  . DILATION AND CURETTAGE OF UTERUS    . ESOPHAGOGASTRODUODENOSCOPY  01/20/2012   Procedure: ESOPHAGOGASTRODUODENOSCOPY (EGD);  Surgeon: Ladene Artist, MD,FACG;  Location: [facility] ENDOSCOPY;  Service: Endoscopy;  Laterality: N/A;  . ESOPHAGOGASTRODUODENOSCOPY N/A 03/26/2013   Procedure: ESOPHAGOGASTRODUODENOSCOPY (EGD);  Surgeon: Irene Shipper, MD;  Location: [facility] ENDOSCOPY;  Service: Endoscopy;  Laterality: N/A;  . ESOPHAGOGASTRODUODENOSCOPY N/A 04/30/2015   Procedure: ESOPHAGOGASTRODUODENOSCOPY (EGD);  Surgeon: Rogene Houston, MD;  [facility];  Service: Endoscopy;  Laterality: N/A;  1pm - moved to 10/20 @ 1:10  . ESOPHAGOGASTRODUODENOSCOPY N/A 07/29/2016   Procedure: ESOPHAGOGASTRODUODENOSCOPY (EGD);  Surgeon: Manus Gunning, MD;  Location: [place];  Service: Gastroenterology;  Laterality: N/A;  enteroscopy  . ESOPHAGOGASTRODUODENOSCOPY N/A 09/26/2019   Procedure: ESOPHAGOGASTRODUODENOSCOPY (  EGD);  Surgeon: Rogene Houston, MD;  [facility];  Service: Endoscopy;  Laterality: N/A;  1250  . GIVENS CAPSULE STUDY N/A 03/07/2019   Procedure: GIVENS CAPSULE STUDY;  Surgeon: Rogene Houston, MD;  [facility];  Service: Endoscopy;  Laterality: N/A;  7:30  . INTRAOPERATIVE TRANSESOPHAGEAL ECHOCARDIOGRAM  06/13/2012   Procedure: INTRAOPERATIVE TRANSESOPHAGEAL ECHOCARDIOGRAM;  Surgeon: Grace Isaac, MD;  Location: [place];  Service: Open Heart Surgery;  Laterality: N/A;  . IR FLUORO GUIDE CV LINE RIGHT  06/17/2020  . IR GENERIC HISTORICAL  07/26/2016   IR FLUORO GUIDE CV LINE RIGHT 07/26/2016  Greggory Keen, MD [facility]  . IR GENERIC HISTORICAL  07/26/2016   IR US GUIDE VASC ACCESS RIGHT 07/26/2016 Greggory Keen, MD [facility]  . IR GENERIC HISTORICAL  08/02/2016   IR US GUIDE VASC ACCESS RIGHT 08/02/2016 Greggory Keen, MD [facility]  . IR GENERIC HISTORICAL  08/02/2016   IR FLUORO GUIDE CV LINE RIGHT 08/02/2016 Greggory Keen, MD [facility]  . IR RADIOLOGY PERIPHERAL GUIDED IV START  03/28/2017  . IR THROMBECTOMY AV FISTULA W/THROMBOLYSIS INC/SHUNT/IMG LEFT Left 06/17/2020  . IR US GUIDE VASC ACCESS LEFT  06/17/2020  . IR US GUIDE VASC ACCESS RIGHT  03/28/2017  . IR US GUIDE VASC ACCESS RIGHT  06/17/2020  . LEFT HEART CATH AND CORONARY ANGIOGRAPHY N/A 09/20/2016   Procedure: Left Heart Cath and Coronary Angiography;  Surgeon: [name], MD;  Location: [place] CV LAB;  Service: Cardiovascular;  Laterality: N/A;  . LEFT HEART CATH AND CORS/GRAFTS ANGIOGRAPHY N/A 10/13/2016   Procedure: Left Heart Cath and Cors/Grafts Angiography;  Surgeon: Troy Sine, MD;  Location: [place] [facility];  Service: Cardiovascular;  Laterality: N/A;  . LEFT HEART CATH AND CORS/GRAFTS ANGIOGRAPHY N/A 07/13/2018   Procedure: LEFT HEART CATH AND CORS/GRAFTS ANGIOGRAPHY;  Surgeon: Martinique, Peter M, MD;  Location: [place] [facility];  Service: Cardiovascular;  Laterality: N/A;  . LEFT HEART CATHETERIZATION WITH CORONARY ANGIOGRAM N/A 12/15/2011   Procedure: LEFT HEART CATHETERIZATION WITH CORONARY ANGIOGRAM;  Surgeon: Burnell Blanks, MD;  Location: [facility] CATH LAB;  Service: Cardiovascular;  Laterality: N/A;  . LEFT HEART CATHETERIZATION WITH CORONARY ANGIOGRAM N/A 01/10/2012   Procedure: LEFT HEART CATHETERIZATION WITH CORONARY ANGIOGRAM;  Surgeon: Peter M Martinique, MD;  Location: [facility] CATH LAB;  Service: Cardiovascular;  Laterality: N/A;  . LEFT HEART CATHETERIZATION WITH CORONARY ANGIOGRAM N/A 06/08/2012   Procedure: LEFT HEART CATHETERIZATION WITH CORONARY ANGIOGRAM;  Surgeon: Burnell Blanks,  MD;  Location: [facility] CATH LAB;  Service: Cardiovascular;  Laterality: N/A;  . LEFT HEART CATHETERIZATION WITH CORONARY/GRAFT ANGIOGRAM N/A 12/10/2013   Procedure: LEFT HEART CATHETERIZATION WITH Beatrix Fetters;  Surgeon: [name], MD;  Location: [facility] CATH LAB;  Service: Cardiovascular;  Laterality: N/A;  . OVARY SURGERY     ovarian cancer  . POLYPECTOMY  03/09/2019   Procedure: POLYPECTOMY;  Surgeon: Rogene Houston, MD;  [facility];  Service: Endoscopy;;  cecal   . POLYPECTOMY N/A 09/26/2019   Procedure: DUODENAL POLYPECTOMY;  Surgeon: Rogene Houston, MD;  [facility];  Service: Endoscopy;  Laterality: N/A;  . REVISION OF ARTERIOVENOUS GORETEX GRAFT N/A 02/24/2017   Procedure: REVISION OF ARTERIOVENOUS GORETEX GRAFT (RESECTION);  Surgeon: Katha Cabal, MD;  Location: [facility] ORS;  Service: Vascular;  Laterality: N/A;  . REVISON OF ARTERIOVENOUS FISTULA Left 06/19/2020   Procedure: REVISION OF LEFT UPPER ARM AV GRAFT  WITH INTERPOSITION JUMP GRAFT USING 6MM GORE LIMB;  Surgeon: Marty Heck, MD;  Location: [place];  Service: Vascular;  Laterality: Left;  . SHUNTOGRAM N/A 10/15/2013   Procedure: Fistulogram;  Surgeon: Serafina Mitchell, MD;  Location: [facility] CATH LAB;  Service: Cardiovascular;  Laterality: N/A;  . THROMBECTOMY / ARTERIOVENOUS GRAFT REVISION  2011   left upper arm  . TUBAL LIGATION  1980's  . UPPER EXTREMITY ANGIOGRAPHY Bilateral 12/06/2016   Procedure: Upper Extremity Angiography;  Surgeon: Katha Cabal, MD;  Location: [place] CV LAB;  Service: Cardiovascular;  Laterality: Bilateral;  . UPPER EXTREMITY INTERVENTION Left 06/06/2017   Procedure: UPPER EXTREMITY INTERVENTION;  Surgeon: Katha Cabal, MD;  Location: [place] CV [facility];  Service: Cardiovascular;  Laterality: Left;    Family History: Family History  Problem Relation Age of Onset  . Heart disease Mother        Heart Disease before age 67  . Hyperlipidemia  Mother   . Hypertension Mother   . Diabetes Mother   . Heart attack Mother   . Heart disease Father        Heart Disease before age 53  . Hyperlipidemia Father   . Hypertension Father   . Diabetes Father   . Diabetes Sister   . Hypertension Sister   . Diabetes Brother   . Hyperlipidemia Brother   . Heart attack Brother   . Hypertension Sister   . Heart attack Brother   . Colon cancer Child 75  . Other Other        noncontributory for early CAD  . Esophageal cancer Neg Hx   . Liver disease Neg Hx   . Kidney disease Neg Hx   . Colon polyps Neg Hx     Social History: Social History   Tobacco Use  Smoking Status Never Smoker  Smokeless Tobacco Never Used   Social History   Substance and Sexual Activity  Alcohol Use No  . Alcohol/week: 0.0 standard drinks   Social History   Substance and Sexual Activity  Drug Use No    Allergies: Allergies  Allergen Reactions  . Aspirin Other (See Comments)    High Doses Mess up her stomach; "makes my bowels have blood in them". Takes 81 mg EC Aspirin   . Penicillins Other (See Comments)    SYNCOPE? , "makes me real weak when I take it; like I'll pass out"  Has patient had a PCN reaction causing immediate rash, facial/tongue/throat swelling, SOB or lightheadedness with hypotension: Yes Has patient had a PCN reaction causing severe rash involving mucus membranes or skin necrosis: no Has patient had a PCN reaction that required hospitalization no Has patient had a PCN reaction occurring within the last 10 years: no If all of the above  . Amlodipine Swelling  . Bactrim [Sulfamethoxazole-Trimethoprim] Rash  . Contrast Media [Iodinated Diagnostic Agents] Itching  . Iron Itching and Other (See Comments)    "they gave me iron in dialysis; had to give me Benadryl cause I had to have the iron" (05/02/2012)  . Nitrofurantoin Hives  . Tylenol [Acetaminophen] Itching and Other (See Comments)    Makes her feet on fire per pt  .  Gabapentin Other (See Comments)    Unknown reaction  . Hydralazine Itching  . Dexilant [Dexlansoprazole] Other (See Comments)    Upset stomach  . Levaquin [Levofloxacin In D5w] Rash  . Morphine And Related Itching and Other (See Comments)    Itching in feet  .  Plavix [Clopidogrel Bisulfate] Rash  . Protonix [Pantoprazole Sodium] Rash  . Venofer [Ferric Oxide] Itching and Other (See Comments)    Patient reports using Benadryl prior to doses as [place]    Medications: Current Outpatient Medications  Medication Sig Dispense Refill  . albuterol (VENTOLIN HFA) 108 (90 Base) MCG/ACT inhaler Inhale 2 puffs into the lungs every 6 (six) hours as needed for wheezing or shortness of breath.    Marland Kitchen amiodarone (PACERONE) 200 MG tablet Take 1 tablet (200 mg total) by mouth daily. Starting July 2 90 tablet 3  . aspirin EC 81 MG tablet Take 81 mg by mouth daily.     . fluticasone (FLONASE) 50 MCG/ACT nasal spray Place 1 spray into both nostrils at bedtime as needed for allergies.     Marland Kitchen gabapentin (NEURONTIN) 100 MG capsule Take 100 mg by mouth daily.     . isosorbide mononitrate (IMDUR) 60 MG 24 hr tablet Take 1 tablet (60 mg total) by mouth 2 (two) times daily. 60 tablet 6  . lidocaine-prilocaine (EMLA) cream Apply 1 application topically every Monday, Wednesday, and Friday. Prior to dialysis    . loratadine (CLARITIN) 10 MG tablet Take 1 tablet (10 mg total) by mouth daily as needed for allergies. 30 tablet 0  . multivitamin (RENA-VIT) TABS tablet Take 1 tablet by mouth daily.    . nitroGLYCERIN (NITROSTAT) 0.4 MG SL tablet Place 1 tablet (0.4 mg total) under the tongue every 5 (five) minutes as needed for chest pain. 15 tablet 1  . omeprazole (PRILOSEC) 40 MG capsule Take 1 capsule (40 mg total) by mouth daily before breakfast. 30 capsule 5  . sevelamer carbonate (RENVELA) 800 MG tablet Take 2 tablets (1,600 mg total) by mouth 3 (three) times daily with meals. 180 tablet 1  . simvastatin (ZOCOR)  20 MG tablet Take 1 tablet (20 mg total) by mouth at bedtime. 90 tablet 3  . cyclobenzaprine (FLEXERIL) 5 MG tablet Take 5 mg by mouth 3 (three) times daily as needed. (Patient not taking: Reported on 07/31/2020)    . dicyclomine (BENTYL) 10 MG capsule Take 10 mg by mouth 2 (two) times daily.  (Patient not taking: Reported on 07/31/2020)     No current facility-administered medications for this visit.    Review of Systems: GENERAL: negative for malaise, night sweats HEENT: No changes in hearing or vision, no nose bleeds or other nasal problems. NECK: Negative for lumps, goiter, pain and significant neck swelling RESPIRATORY: Negative for cough, wheezing CARDIOVASCULAR: Negative for chest pain, leg swelling, palpitations, orthopnea GI: SEE HPI MUSCULOSKELETAL: Negative for joint pain or swelling, back pain, and muscle pain. SKIN: Negative for lesions, rash PSYCH: Negative for sleep disturbance, mood disorder and recent psychosocial stressors. HEMATOLOGY Negative for prolonged bleeding, bruising easily, and swollen nodes. ENDOCRINE: Negative for cold or heat intolerance, polyuria, polydipsia and goiter. NEURO: negative for tremor, gait imbalance, syncope and seizures. The remainder of the review of systems is noncontributory.   Physical Exam: No exam was performed as this was a telephone encounter  Imaging/Labs: as above  I personally reviewed and interpreted the available labs, imaging and endoscopic files.  Impression and Plan: Shawna [name] is a 80 y.o. female with PMH ESRD on dyalisis, congestive heart failure, history of colon cancer, TIA, PAF, history of AVMs, HLD, HTN, CAD status post CABG and stent placement, who presents for evaluation of chronic bloating and episodes of melena.  In terms of her chronic bloating, the patient has  had abdominal imaging in the past that did not show any alterations explain her chronic bloating (based on CT abdomen and pelvis without IV contrast on  04/06/2020).  She has previously responded to a course of Flagyl for possible SIBO.  Given the presence of concomitant diarrhea, it is possible that given her multiple comorbidities she is presenting with recurrent SIBO, I will prescribe a course of Flagyl and symptom improvement will be addressed in follow-up appointment.  In terms of her episodes of melena, it is possible that it is related to recurrent intermittent bleeding from AVMs, as she was found to have a diminutive lesion in her duodenum and had evidence of other jejunal lesions.  We will check a CBC and iron studies to determine if she had any significant drop in her hemoglobin.  If so she will need to undergo a push enteroscopy to ablate these lesions.  Fortunately, she has not required any blood transfusions since her last endoscopy.  Regardless, it would be important to repeat at least an EGD in the future to assess the status of her duodenal adenoma as it is unclear if it was completely removed during her last EGD.  We will plan this accordingly based on blood testing results.  - Check CBC and iron studies - Start Flagyl 500 mg TID for 10 days - Will plan EGD vs push push enteroscopy based on findings - f/u 2 months  All questions were answered.      Total visit time: I spent a total of  30 minutes  Shawna Peppers, MD Gastroenterology and Hepatology [facility]

## 2020-07-31 NOTE — Telephone Encounter (Signed)
Per Dr.Castaneda patient will need a follow up visit with him in 2 months.

## 2020-08-04 ENCOUNTER — Telehealth: Payer: Self-pay | Admitting: Cardiology

## 2020-08-04 NOTE — Telephone Encounter (Signed)
Pt c/o of Chest Pain: 1. Are you having CP right now? NO 2. Are you experiencing any other symptoms (ex. SOB, nausea, vomiting, sweating)? NO 3. How long have you been experiencing CP? 2 DAYS MOSTLY AT NIGHT  4. Is your CP continuous or coming and going?  COMES AND GOES  5. Have you taken Nitroglycerin? YES  - STATES THAT PAIN WENT AWAY.

## 2020-08-04 NOTE — Telephone Encounter (Signed)
Reports intermittent chest pain at night for the past 2-3 nights that radiates down center of back rated 10/10. Reports taking nitroglycerin and symptoms improve. Has not check vitals today. Reports SOB. Denies dizziness. Denies active chest pain. Advised to continue monitoring symptoms and if she has chest pain 10/10 again-she needed to go to the ED for an evaluation. Advised to keep scheduled visit with Dr. Harl Bowie on 08/27/2020 and gave first available appointment for 08/20/20 with Katina Dung, NP. Verbalized understanding of plan.

## 2020-08-05 ENCOUNTER — Other Ambulatory Visit (HOSPITAL_COMMUNITY): Payer: Self-pay | Admitting: Medical

## 2020-08-05 ENCOUNTER — Telehealth (HOSPITAL_COMMUNITY): Payer: Self-pay

## 2020-08-05 DIAGNOSIS — N186 End stage renal disease: Secondary | ICD-10-CM

## 2020-08-05 NOTE — Telephone Encounter (Signed)
Agree with your assessment and plan  J Goebel Hellums MD 

## 2020-08-05 NOTE — Telephone Encounter (Signed)
Called to schedule cvc removal, no answer, left vm. AW  

## 2020-08-10 ENCOUNTER — Emergency Department (HOSPITAL_COMMUNITY): Payer: Medicare HMO

## 2020-08-10 ENCOUNTER — Encounter (HOSPITAL_COMMUNITY): Payer: Self-pay

## 2020-08-10 ENCOUNTER — Emergency Department (HOSPITAL_COMMUNITY)
Admission: EM | Admit: 2020-08-10 | Discharge: 2020-08-11 | Disposition: A | Discharge status: Home / Self Care | Payer: Medicare HMO | Attending: Emergency Medicine | Admitting: Emergency Medicine

## 2020-08-10 ENCOUNTER — Other Ambulatory Visit: Payer: Self-pay | Admitting: *Deleted

## 2020-08-10 ENCOUNTER — Other Ambulatory Visit: Payer: Self-pay

## 2020-08-10 ENCOUNTER — Telehealth: Payer: Self-pay | Admitting: Cardiology

## 2020-08-10 DIAGNOSIS — Z951 Presence of aortocoronary bypass graft: Secondary | ICD-10-CM | POA: Diagnosis not present

## 2020-08-10 DIAGNOSIS — Z7982 Long term (current) use of aspirin: Secondary | ICD-10-CM | POA: Insufficient documentation

## 2020-08-10 DIAGNOSIS — Z955 Presence of coronary angioplasty implant and graft: Secondary | ICD-10-CM | POA: Diagnosis not present

## 2020-08-10 DIAGNOSIS — Z85038 Personal history of other malignant neoplasm of large intestine: Secondary | ICD-10-CM | POA: Diagnosis not present

## 2020-08-10 DIAGNOSIS — I130 Hypertensive heart and chronic kidney disease with heart failure and stage 1 through stage 4 chronic kidney disease, or unspecified chronic kidney disease: Secondary | ICD-10-CM | POA: Diagnosis not present

## 2020-08-10 DIAGNOSIS — N184 Chronic kidney disease, stage 4 (severe): Secondary | ICD-10-CM | POA: Insufficient documentation

## 2020-08-10 DIAGNOSIS — I209 Angina pectoris, unspecified: Secondary | ICD-10-CM

## 2020-08-10 DIAGNOSIS — I5033 Acute on chronic diastolic (congestive) heart failure: Secondary | ICD-10-CM | POA: Diagnosis not present

## 2020-08-10 DIAGNOSIS — R079 Chest pain, unspecified: Secondary | ICD-10-CM | POA: Diagnosis present

## 2020-08-10 DIAGNOSIS — Z8543 Personal history of malignant neoplasm of ovary: Secondary | ICD-10-CM | POA: Insufficient documentation

## 2020-08-10 LAB — CBC WITH DIFFERENTIAL/PLATELET
Abs Immature Granulocytes: 0.62 10*3/uL — ABNORMAL HIGH (ref 0.00–0.07)
Basophils Absolute: 0 10*3/uL (ref 0.0–0.1)
Basophils Relative: 0 %
Eosinophils Absolute: 0.6 10*3/uL — ABNORMAL HIGH (ref 0.0–0.5)
Eosinophils Relative: 4 %
HCT: 35.1 % — ABNORMAL LOW (ref 36.0–46.0)
Hemoglobin: 11 g/dL — ABNORMAL LOW (ref 12.0–15.0)
Immature Granulocytes: 4 %
Lymphocytes Relative: 4 %
Lymphs Abs: 0.6 10*3/uL — ABNORMAL LOW (ref 0.7–4.0)
MCH: 37.5 pg — ABNORMAL HIGH (ref 26.0–34.0)
MCHC: 31.3 g/dL (ref 30.0–36.0)
MCV: 119.8 fL — ABNORMAL HIGH (ref 80.0–100.0)
Monocytes Absolute: 0.7 10*3/uL (ref 0.1–1.0)
Monocytes Relative: 5 %
Neutro Abs: 12.3 10*3/uL — ABNORMAL HIGH (ref 1.7–7.7)
Neutrophils Relative %: 83 %
Platelets: 311 10*3/uL (ref 150–400)
RBC: 2.93 MIL/uL — ABNORMAL LOW (ref 3.87–5.11)
RDW: 19.6 % — ABNORMAL HIGH (ref 11.5–15.5)
WBC: 14.8 10*3/uL — ABNORMAL HIGH (ref 4.0–10.5)
nRBC: 0 % (ref 0.0–0.2)

## 2020-08-10 LAB — COMPREHENSIVE METABOLIC PANEL
ALT: 17 U/L (ref 0–44)
AST: 15 U/L (ref 15–41)
Albumin: 4 g/dL (ref 3.5–5.0)
Alkaline Phosphatase: 105 U/L (ref 38–126)
Anion gap: 13 (ref 5–15)
BUN: 24 mg/dL — ABNORMAL HIGH (ref 8–23)
CO2: 26 mmol/L (ref 22–32)
Calcium: 9.6 mg/dL (ref 8.9–10.3)
Chloride: 95 mmol/L — ABNORMAL LOW (ref 98–111)
Creatinine, Ser: 4.85 mg/dL — ABNORMAL HIGH (ref 0.44–1.00)
GFR, Estimated: 9 mL/min — ABNORMAL LOW (ref >60)
Glucose, Bld: 146 mg/dL — ABNORMAL HIGH (ref 70–99)
Potassium: 3.3 mmol/L — ABNORMAL LOW (ref 3.5–5.1)
Sodium: 134 mmol/L — ABNORMAL LOW (ref 135–145)
Total Bilirubin: 0.4 mg/dL (ref 0.3–1.2)
Total Protein: 7.5 g/dL (ref 6.5–8.1)

## 2020-08-10 LAB — TROPONIN I (HIGH SENSITIVITY)
Troponin I (High Sensitivity): 45 ng/L — ABNORMAL HIGH (ref <18)
Troponin I (High Sensitivity): 54 ng/L — ABNORMAL HIGH (ref <18)
Troponin I (High Sensitivity): 57 ng/L — ABNORMAL HIGH (ref <18)

## 2020-08-10 IMAGING — DX DG CHEST 1V PORT
1 series · 1 of 1 positions shown · non-contrast
Comparison: [DATE]

CLINICAL DATA: Chest pain

EXAM:
PORTABLE CHEST 1 VIEW

[chest ap]
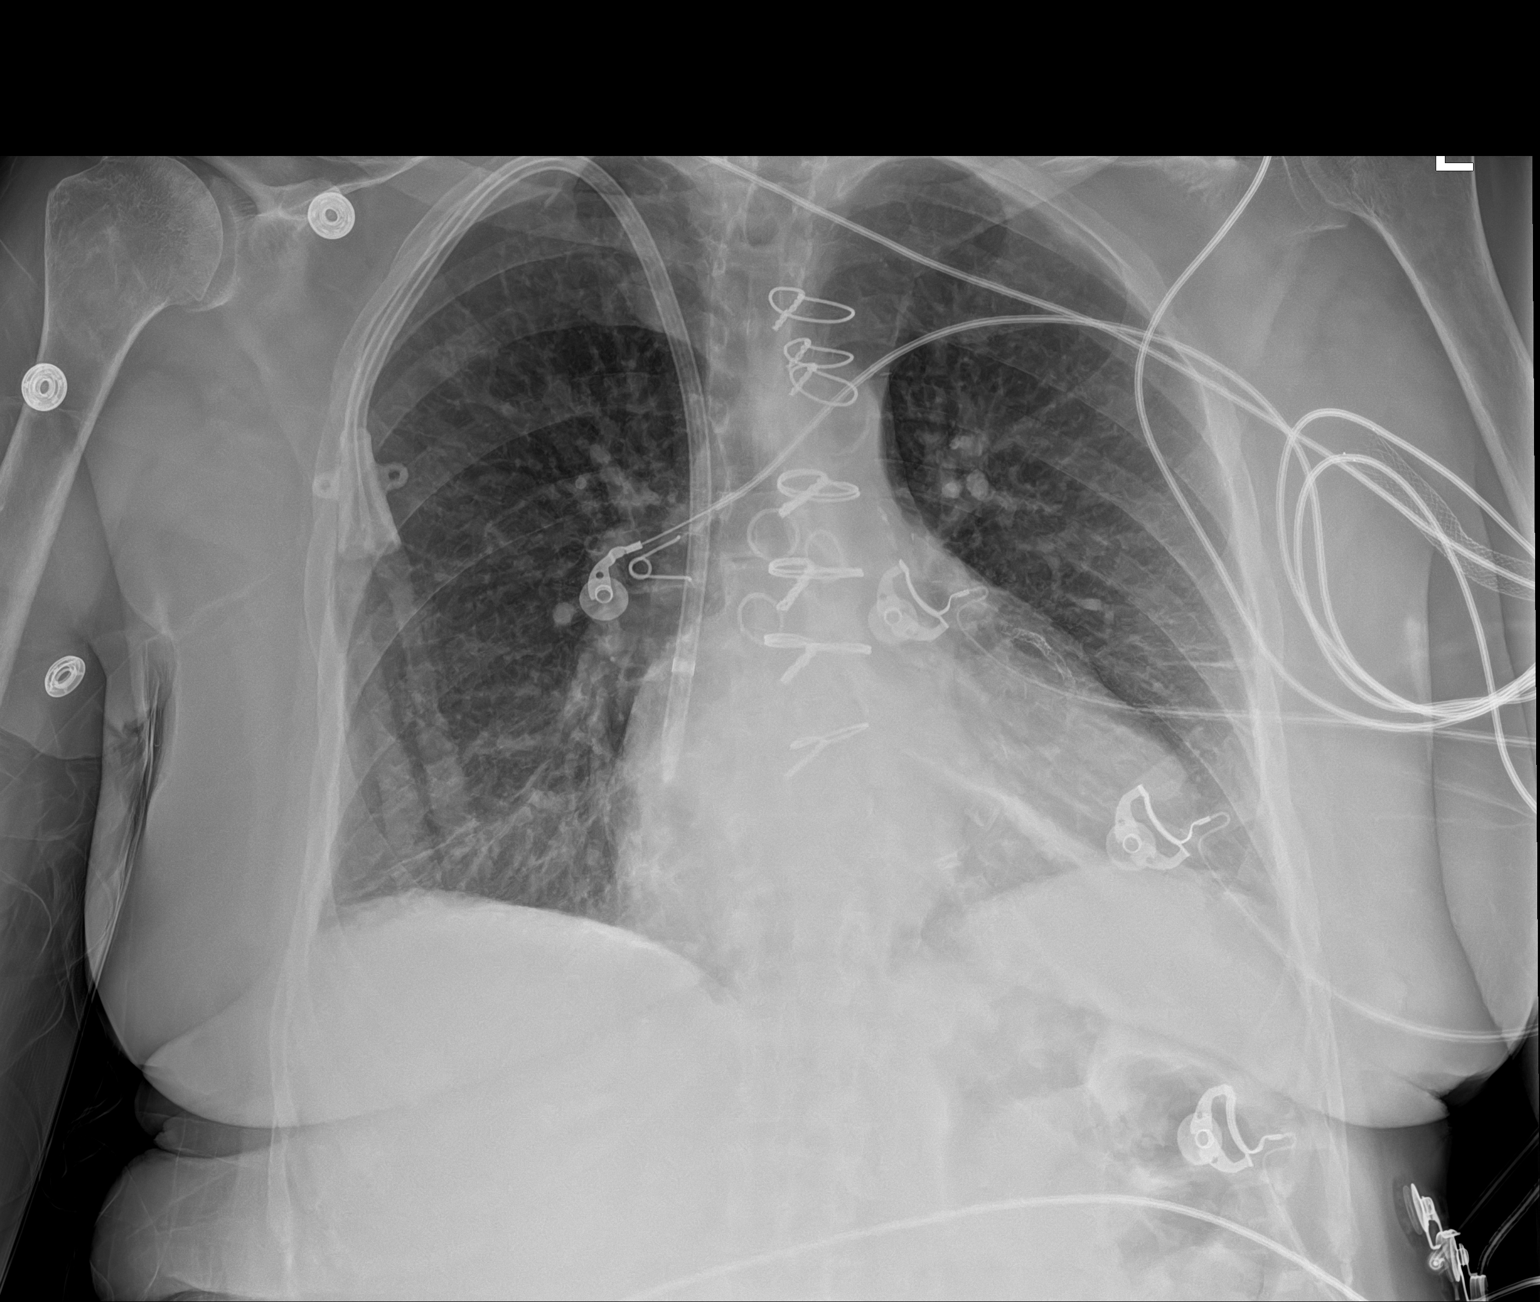

[1 of 1 positions shown; findings below may reference images not displayed]

FINDINGS: Lungs are clear. No pneumothorax or pleural effusion. Coronary
artery bypass grafting has been performed. Cardiac size within
normal limits. Right internal jugular hemodialysis catheter tip
within the right atrium. No acute bone abnormality.
IMPRESSION: No active disease.

## 2020-08-10 MED ORDER — ASPIRIN 81 MG PO CHEW
324.0000 mg | CHEWABLE_TABLET | Freq: Once | ORAL | Status: AC
  Administered 2020-08-10: 324 mg via ORAL
  Filled 2020-08-10: qty 4

## 2020-08-10 MED ORDER — ISOSORBIDE MONONITRATE ER 60 MG PO TB24: 60.0000 mg | ORAL_TABLET | Freq: Two times a day (BID) | ORAL | 6 refills | Status: DC

## 2020-08-10 NOTE — ED Provider Notes (Signed)
[facility] Provider Note   CSN: [id number] Arrival date & time: 08/10/20  1531     History Chief Complaint  Patient presents with  . Chest Pain    Shawna [name] is a 80 y.o. female.  Patient followed by cardiology in [place].  Patient is also a dialysis patient.  Gets dialyzed Monday Wednesdays and Fridays.  Patient had cardiac catheterization most recently in 2020.  Which showed some occlusive disease.  That was not amendable to stents.  Medical management was the plan.  Patient states that this morning she had some anterior chest pain more left than right that lasted 15 minutes resolved with nitroglycerin.  At the end of dialysis which she went to today she got some recurrent chest pain may have lasted 20 minutes.  That was resolved with nitroglycerin.  Patient has not had any chest pain while she has been here.        Past Medical History:  Diagnosis Date  . Acute on chronic respiratory failure with hypoxia ([place]) 10/10/2016  . Anxiety   . Arthritis   . AVM (arteriovenous malformation) of colon   . CAD (coronary artery disease)    a. s/p CABG in 2013 b. DES to D1 in 10/2016. c. cath in 07/2018 showing patent grafts with occlusion of D1 at prior stent site and progression of PDA disease --> medical management recommended  . Carotid artery disease ([place])    a. 45-62% LICA, 11/6387   . Chronic anemia   . Chronic bronchitis ([place])   . Chronic diastolic CHF (congestive heart failure) ([place])    a. 02/2012 Echo EF 60-65%, nl wall motion, Gr 1 DD, mod MR  . Colon cancer ([place]) 1992  . Esophageal stricture   . ESRD on hemodialysis ([place])    ESRD due to HTN, started dialysis 2011 and gets HD at [facility] with Dr Hinda Lenis on MWF schedule.  Access is LUA AVF as of Sept 2014.   Marland Kitchen GERD (gastroesophageal reflux disease)   . High cholesterol 12/2011  . History of blood transfusion 07/2011; 12/2011; 01/2012 X 2; 04/2012  . History of gout   . History of lower GI bleeding   .  Hypertension   . Iron deficiency anemia   . Mitral regurgitation    a. Moderate by echo, 02/2012  . Myocardial infarction ([place])   . NSVT (nonsustained ventricular tachycardia) ([place])   . Ovarian cancer ([place]) 1992  . PAF (paroxysmal atrial fibrillation) ([place])   . Pneumonia ~ 2009  . PUD (peptic ulcer disease)   . TIA (transient ischemic attack)     Patient Active Problem List   Diagnosis Date Noted  . Clotted renal dialysis AV graft, initial encounter ([place])   . Hypertensive heart and chronic kidney disease with heart failure and stage 1 through stage 4 chronic kidney disease, or chronic kidney disease ([place])   . Hemodialysis-associated hypotension   . Acute hypoxemic respiratory failure ([place]) 06/14/2020  . Hypertension 06/12/2020  . Irritable bowel syndrome 02/25/2020  . Pulmonary edema 10/14/2019  . Adenomatous duodenal polyp 09/10/2019  . History of GI bleed 09/10/2019  . Angina pectoris ([place]) 06/05/2019  . Chest pain 06/03/2019  . Rectal bleeding 05/14/2019  . Small intestinal bacterial overgrowth 05/14/2019  . Iron deficiency anemia 04/02/2019  . GI bleed 03/08/2019  . Gastrointestinal hemorrhage with melena 03/06/2019  . Acute respiratory failure with hypoxia ([place]) 12/25/2018  . Elevated troponin 12/14/2018  . Chest pain at rest 07/13/2018  .  Hand steal syndrome ([place]) 08/01/2017  . Macrocytic anemia 07/14/2017  . Coronary artery disease 06/05/2017  . Mesenteric ischemia ([place])   . Diverticulitis   . Enteritis   . Complication of vascular access for dialysis 03/19/2017  . Preoperative clearance 01/25/2017  . Symptomatic anemia 10/24/2016  . H/O non-ST elevation myocardial infarction (NSTEMI) 10/24/2016  . Fluid overload 10/10/2016  . Complication from renal dialysis device 10/10/2016  . Non-ST elevation MI (NSTEMI) ([place])   . Encounter for fitting and adjustment of vascular catheter   . Heme positive stool   . Demand ischemia ([place]) 07/27/2016  . Hypertensive emergency  07/08/2016  . Acute on chronic respiratory failure with hypoxia ([place])   . Cardiac arrest ([place])   . Palliative care encounter   . Goals of care, counseling/discussion   . Hypertensive crisis without congestive heart failure 05/09/2016  . Acute pulmonary edema ([place]) 04/06/2016  . Acute respiratory failure ([place]) 04/06/2016  . Hypertensive crisis 01/27/2016  . History of colon cancer 01/27/2016  . History of ovarian cancer 01/27/2016  . Hypertensive urgency 01/27/2016  . Paroxysmal atrial fibrillation ([place]) 10/14/2015  . Coronary angioplasty status 10/14/2015  . Malignant neoplasm of right ovary ([place]) 10/14/2015  . Narrow complex tachycardia ([place]) 09/08/2015  . SVT (supraventricular tachycardia) ([place]) 09/08/2015  . Influenza A 08/30/2015  . Acute on chronic diastolic CHF (congestive heart failure) ([place]) 05/04/2015  . Unstable angina ([place]) 05/03/2015  . DOE (dyspnea on exertion)   . Essential hypertension   . Pain in joint, lower leg 08/14/2014  . Dacryocystitis 05/29/2013  . Chronic diastolic CHF (congestive heart failure) ([place]) 03/22/2013  . GI bleeding 03/21/2013  . Acute blood loss anemia 03/21/2013  . Vaginal odor 03/12/2013  . Vaginal discharge 03/12/2013  . Occlusion and stenosis of carotid artery without mention of cerebral infarction 01/24/2013  . Hx of CABG 07/05/2012  . Carotid artery disease ([place]) 07/05/2012  . Mitral regurgitation 06/12/2012  . Pneumonia 06/09/2012  . Non-STEMI (non-ST elevated myocardial infarction) ([place]) 06/08/2012  . Ischemic chest pain ([place]) 03/01/2012  . 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 GERD (gastroesophageal reflux disease) 01/09/2012  . HLD (hyperlipidemia) 01/05/2012  . Atherosclerotic heart disease of native coronary artery without angina pectoris 12/16/2011  . Essential hypertension, benign 12/16/2011  . ESRD on hemodialysis ([place]) 12/16/2011  . Anxiety disorder 05/04/2011  . Anemia in chronic kidney  disease 04/29/2011  . Secondary hyperparathyroidism of renal origin ([place]) 04/29/2011  . ESRD (end stage renal disease) on dialysis ([place]) 04/29/2011  . Gout 04/29/2011  . Hypertensive chronic kidney disease with stage 5 chronic kidney disease or end stage renal disease ([place]) 04/29/2011    Past Surgical History:  Procedure Laterality Date  . A/V SHUNTOGRAM Left 03/19/2019   Procedure: A/V SHUNTOGRAM;  Surgeon: Katha Cabal, MD;  Location: [place] [facility];  Service: Cardiovascular;  Laterality: Left;  . ABDOMINAL HYSTERECTOMY  1992  . APPENDECTOMY  06/1990  . AV FISTULA PLACEMENT  07/2009   left upper arm  . AV FISTULA PLACEMENT Right 09/06/2016   Procedure: RIGHT FOREARM ARTERIOVENOUS (AV) GRAFT;  Surgeon: Elam Dutch, MD;  Location: [facility] OR;  Service: Vascular;  Laterality: Right;  . AV FISTULA PLACEMENT N/A 02/24/2017   Procedure: INSERTION OF ARTERIOVENOUS (AV) GORE-TEX GRAFT ARM (BRACHIAL AXILLARY);  Surgeon: Katha Cabal, MD;  Location: [facility] ORS;  Service: Vascular;  Laterality: N/A;  . [place] Right 09/06/2016   Procedure: REMOVAL OF Right Arm ARTERIOVENOUS GORETEX  GRAFT and Vein Patch angioplasty of brachial artery;  Surgeon: Angelia Mould, MD;  Location: [place];  Service: Vascular;  Laterality: Right;  . BIOPSY  09/26/2019   Procedure: BIOPSY;  Surgeon: Rogene Houston, MD;  [facility];  Service: Endoscopy;;  . COLON RESECTION  1992  . COLON SURGERY    . COLONOSCOPY N/A 03/09/2019   Procedure: COLONOSCOPY;  Surgeon: Rogene Houston, MD;  [facility];  Service: Endoscopy;  Laterality: N/A;  . CORONARY ANGIOPLASTY WITH STENT PLACEMENT  12/15/11   "2"  . CORONARY ANGIOPLASTY WITH STENT PLACEMENT  y/2013   "1; makes total of 3" (05/02/2012)  . CORONARY ARTERY BYPASS GRAFT  06/13/2012   Procedure: CORONARY ARTERY BYPASS GRAFTING (CABG);  Surgeon: Grace Isaac, MD;  Location: [place];  Service: Open Heart Surgery;  Laterality: N/A;   cabg x four;  using left internal mammary artery, and left leg greater saphenous vein harvested endoscopically  . CORONARY STENT INTERVENTION N/A 10/13/2016   Procedure: Coronary Stent Intervention;  Surgeon: Troy Sine, MD;  Location: [place] CV LAB;  Service: Cardiovascular;  Laterality: N/A;  . DIALYSIS/PERMA CATHETER REMOVAL N/A 04/18/2017   Procedure: DIALYSIS/PERMA CATHETER REMOVAL;  Surgeon: Katha Cabal, MD;  Location: [place] [facility];  Service: Cardiovascular;  Laterality: N/A;  . DILATION AND CURETTAGE OF UTERUS    . ESOPHAGOGASTRODUODENOSCOPY  01/20/2012   Procedure: ESOPHAGOGASTRODUODENOSCOPY (EGD);  Surgeon: Ladene Artist, MD,FACG;  Location: [facility] ENDOSCOPY;  Service: Endoscopy;  Laterality: N/A;  . ESOPHAGOGASTRODUODENOSCOPY N/A 03/26/2013   Procedure: ESOPHAGOGASTRODUODENOSCOPY (EGD);  Surgeon: Irene Shipper, MD;  Location[facility] ENDOSCOPY;  Service: Endoscopy;  Laterality: N/A;  . ESOPHAGOGASTRODUODENOSCOPY N/A 04/30/2015   Procedure: ESOPHAGOGASTRODUODENOSCOPY (EGD);  Surgeon: Rogene Houston, MD;  [facility];  Service: Endoscopy;  Laterality: N/A;  1pm - moved to 10/20 @ 1:10  . ESOPHAGOGASTRODUODENOSCOPY N/A 07/29/2016   Procedure: ESOPHAGOGASTRODUODENOSCOPY (EGD);  Surgeon: Manus Gunning, MD;  Location: [place];  Service: Gastroenterology;  Laterality: N/A;  enteroscopy  . ESOPHAGOGASTRODUODENOSCOPY N/A 09/26/2019   Procedure: ESOPHAGOGASTRODUODENOSCOPY (EGD);  Surgeon: Rogene Houston, MD;  [facility];  Service: Endoscopy;  Laterality: N/A;  1250  . GIVENS CAPSULE STUDY N/A 03/07/2019   Procedure: GIVENS CAPSULE STUDY;  Surgeon: Rogene Houston, MD;  [facility];  Service: Endoscopy;  Laterality: N/A;  7:30  . INTRAOPERATIVE TRANSESOPHAGEAL ECHOCARDIOGRAM  06/13/2012   Procedure: INTRAOPERATIVE TRANSESOPHAGEAL ECHOCARDIOGRAM;  Surgeon: Grace Isaac, MD;  Location: [place];  Service: Open Heart Surgery;  Laterality:  N/A;  . IR FLUORO GUIDE CV LINE RIGHT  06/17/2020  . IR GENERIC HISTORICAL  07/26/2016   IR FLUORO GUIDE CV LINE RIGHT 07/26/2016 Greggory Keen, MD [facility]  . IR GENERIC HISTORICAL  07/26/2016   IR US GUIDE VASC ACCESS RIGHT 07/26/2016 Greggory Keen, MD [facility]  . IR GENERIC HISTORICAL  08/02/2016   IR US GUIDE VASC ACCESS RIGHT 08/02/2016 Greggory Keen, MD [facility]  . IR GENERIC HISTORICAL  08/02/2016   IR FLUORO GUIDE CV LINE RIGHT 08/02/2016 Greggory Keen, MD [facility]  . IR RADIOLOGY PERIPHERAL GUIDED IV START  03/28/2017  . IR THROMBECTOMY AV FISTULA W/THROMBOLYSIS INC/SHUNT/IMG LEFT Left 06/17/2020  . IR US GUIDE VASC ACCESS LEFT  06/17/2020  . IR US GUIDE VASC ACCESS RIGHT  03/28/2017  . IR US GUIDE VASC ACCESS RIGHT  06/17/2020  . LEFT HEART CATH AND CORONARY ANGIOGRAPHY N/A  09/20/2016   Procedure: Left Heart Cath and Coronary Angiography;  Surgeon: [name], MD;  Location: [place] [facility];  Service: Cardiovascular;  Laterality: N/A;  . LEFT HEART CATH AND CORS/GRAFTS ANGIOGRAPHY N/A 10/13/2016   Procedure: Left Heart Cath and Cors/Grafts Angiography;  Surgeon: Troy Sine, MD;  Location: [place] [facility];  Service: Cardiovascular;  Laterality: N/A;  . LEFT HEART CATH AND CORS/GRAFTS ANGIOGRAPHY N/A 07/13/2018   Procedure: LEFT HEART CATH AND CORS/GRAFTS ANGIOGRAPHY;  Surgeon: Martinique, Peter M, MD;  Location: [place] [facility] LAB;  Service: Cardiovascular;  Laterality: N/A;  . LEFT HEART CATHETERIZATION WITH CORONARY ANGIOGRAM N/A 12/15/2011   Procedure: LEFT HEART CATHETERIZATION WITH CORONARY ANGIOGRAM;  Surgeon: Burnell Blanks, MD;  Location: [facility] CATH LAB;  Service: Cardiovascular;  Laterality: N/A;  . LEFT HEART CATHETERIZATION WITH CORONARY ANGIOGRAM N/A 01/10/2012   Procedure: LEFT HEART CATHETERIZATION WITH CORONARY ANGIOGRAM;  Surgeon: Peter M Martinique, MD;  Location: [facility] C[facility] LAB;  Service: Cardiovascular;  Laterality: N/A;  . LEFT HEART CATHETERIZATION WITH  CORONARY ANGIOGRAM N/A 06/08/2012   Procedure: LEFT HEART CATHETERIZATION WITH CORONARY ANGIOGRAM;  Surgeon: Burnell Blanks, MD;  Location: [facility] CATH LAB;  Service: Cardiovascular;  Laterality: N/A;  . LEFT HEART CATHETERIZATION WITH CORONARY/GRAFT ANGIOGRAM N/A 12/10/2013   Procedure: LEFT HEART CATHETERIZATION WITH Beatrix Fetters;  Surgeon: [name], MD;  Location: [facility] CATH LAB;  Service: Cardiovascular;  Laterality: N/A;  . OVARY SURGERY     ovarian cancer  . POLYPECTOMY  03/09/2019   Procedure: POLYPECTOMY;  Surgeon: Rogene Houston, MD;  [facility];  Service: Endoscopy;;  cecal   . POLYPECTOMY N/A 09/26/2019   Procedure: DUODENAL POLYPECTOMY;  Surgeon: Rogene Houston, MD;  [facility];  Service: Endoscopy;  Laterality: N/A;  . REVISION OF ARTERIOVENOUS GORETEX GRAFT N/A 02/24/2017   Procedure: REVISION OF ARTERIOVENOUS GORETEX GRAFT (RESECTION);  Surgeon: Katha Cabal, MD;  Location: [facility] ORS;  Service: Vascular;  Laterality: N/A;  . REVISON OF ARTERIOVENOUS FISTULA Left 06/19/2020   Procedure: REVISION OF LEFT UPPER ARM AV GRAFT WITH INTERPOSITION JUMP GRAFT USING 6MM GORE LIMB;  Surgeon: Marty Heck, MD;  Location: [place];  Service: Vascular;  Laterality: Left;  . SHUNTOGRAM N/A 10/15/2013   Procedure: Fistulogram;  Surgeon: Serafina Mitchell, MD;  Location: [facility] CATH LAB;  Service: Cardiovascular;  Laterality: N/A;  . THROMBECTOMY / ARTERIOVENOUS GRAFT REVISION  2011   left upper arm  . TUBAL LIGATION  1980's  . UPPER EXTREMITY ANGIOGRAPHY Bilateral 12/06/2016   Procedure: Upper Extremity Angiography;  Surgeon: Katha Cabal, MD;  Location: [place] [facility] LAB;  Service: Cardiovascular;  Laterality: Bilateral;  . UPPER EXTREMITY INTERVENTION Left 06/06/2017   Procedure: UPPER EXTREMITY INTERVENTION;  Surgeon: Katha Cabal, MD;  Location: [place] [facility] LAB;  Service: Cardiovascular;  Laterality: Left;     OB  History    Gravida  2   Para  2   Term      Preterm  2   AB      Living  2     SAB      IAB      Ectopic      Multiple      Live Births              Family History  Problem Relation Age of Onset  . Heart disease Mother        Heart Disease before  age 40  . Hyperlipidemia Mother   . Hypertension Mother   . Diabetes Mother   . Heart attack Mother   . Heart disease Father        Heart Disease before age 1  . Hyperlipidemia Father   . Hypertension Father   . Diabetes Father   . Diabetes Sister   . Hypertension Sister   . Diabetes Brother   . Hyperlipidemia Brother   . Heart attack Brother   . Hypertension Sister   . Heart attack Brother   . Colon cancer Child 43  . Other Other        noncontributory for early CAD  . Esophageal cancer Neg Hx   . Liver disease Neg Hx   . Kidney disease Neg Hx   . Colon polyps Neg Hx     Social History   Tobacco Use  . Smoking status: Never Smoker  . Smokeless tobacco: Never Used  Vaping Use  . Vaping Use: Never used  Substance Use Topics  . Alcohol use: No    Alcohol/week: 0.0 standard drinks  . Drug use: No    Home Medications Prior to Admission medications   Medication Sig Start Date End Date Taking? Authorizing Provider  albuterol (VENTOLIN HFA) 108 (90 Base) MCG/ACT inhaler Inhale 2 puffs into the lungs every 6 (six) hours as needed for wheezing or shortness of breath.   Yes [provider]  amiodarone (PACERONE) 200 MG tablet Take 1 tablet (200 mg total) by mouth daily. Starting July 2 12/17/19  Yes Strader, Fransisco Hertz, PA-C  aspirin EC 81 MG tablet Take 81 mg by mouth daily.  09/27/19  Yes Rehman, Mechele Dawley, MD  fluticasone (FLONASE) 50 MCG/ACT nasal spray Place 1 spray into both nostrils at bedtime as needed for allergies.    Yes [provider]  gabapentin (NEURONTIN) 100 MG capsule Take 100 mg by mouth daily.  04/16/20  Yes [provider]  isosorbide mononitrate (IMDUR) 60 MG 24  hr tablet Take 1 tablet (60 mg total) by mouth 2 (two) times daily. 08/10/20 11/08/20 Yes BranchAlphonse Guild, MD  lidocaine-prilocaine (EMLA) cream Apply 1 application topically every Monday, Wednesday, and Friday. Prior to dialysis   Yes [provider]  multivitamin (RENA-VIT) TABS tablet Take 1 tablet by mouth daily.   Yes [provider]  nitroGLYCERIN (NITROSTAT) 0.4 MG SL tablet Place 1 tablet (0.4 mg total) under the tongue every 5 (five) minutes as needed for chest pain. 05/07/20  Yes Johnson, Clanford L, MD  omeprazole (PRILOSEC) 40 MG capsule Take 1 capsule (40 mg total) by mouth daily before breakfast. 03/31/20  Yes Laurine Blazer B, PA-C  predniSONE (STERAPRED UNI-PAK 21 TAB) 10 MG (21) TBPK tablet Take 10 mg by mouth as directed. 08/04/20  Yes [provider]  sevelamer carbonate (RENVELA) 800 MG tablet Take 2 tablets (1,600 mg total) by mouth 3 (three) times daily with meals. 06/20/20  Yes Meccariello, Bernita Raisin, DO  simvastatin (ZOCOR) 20 MG tablet Take 1 tablet (20 mg total) by mouth at bedtime. 07/07/20  Yes Verta Ellen., NP  cyclobenzaprine (FLEXERIL) 5 MG tablet Take 5 mg by mouth 3 (three) times daily as needed. Patient not taking: No sig reported 06/22/20   [provider]  dicyclomine (BENTYL) 10 MG capsule Take 10 mg by mouth 2 (two) times daily.  Patient not taking: No sig reported    [provider]  loratadine (CLARITIN) 10 MG tablet Take 1  tablet (10 mg total) by mouth daily as needed for allergies. Patient not taking: Reported on 08/10/2020 07/14/18   Hosie Poisson, MD  metroNIDAZOLE (FLAGYL) 500 MG tablet Take 1 tablet (500 mg total) by mouth 3 (three) times daily for 10 days. Patient not taking: Reported on 08/10/2020 07/31/20 08/10/20  Harvel Quale, MD    Allergies    Aspirin, Penicillins, Amlodipine, Bactrim [sulfamethoxazole-trimethoprim], Contrast media [iodinated diagnostic agents], Iron, Nitrofurantoin,  Tylenol [acetaminophen], Gabapentin, Hydralazine, Dexilant [dexlansoprazole], Levaquin [levofloxacin in d5w], Morphine and related, Plavix [clopidogrel bisulfate], Protonix [pantoprazole sodium], and Venofer [ferric oxide]  Review of Systems   Review of Systems  Constitutional: Negative for chills and fever.  HENT: Negative for rhinorrhea and sore throat.   Eyes: Negative for visual disturbance.  Respiratory: Negative for cough and shortness of breath.   Cardiovascular: Positive for chest pain. Negative for leg swelling.  Gastrointestinal: Negative for abdominal pain, diarrhea, nausea and vomiting.  Genitourinary: Negative for dysuria.  Musculoskeletal: Negative for back pain and neck pain.  Skin: Negative for rash.  Neurological: Negative for dizziness, light-headedness and headaches.  Hematological: Does not bruise/bleed easily.  Psychiatric/Behavioral: Negative for confusion.    Physical Exam Updated Vital Signs BP (!) 165/58   Pulse 71   Temp 98.3 F (36.8 C) (Oral)   Resp 16   Ht 1.549 m (5\' 1" )   Wt 63.5 kg   SpO2 100%   BMI 26.45 kg/m   Physical Exam Vitals and nursing note reviewed.  Constitutional:      General: She is not in acute distress.    Appearance: Normal appearance. She is well-developed and well-nourished.  HENT:     Head: Normocephalic and atraumatic.  Eyes:     Extraocular Movements: Extraocular movements intact.     Conjunctiva/sclera: Conjunctivae normal.     Pupils: Pupils are equal, round, and reactive to light.  Cardiovascular:     Rate and Rhythm: Normal rate and regular rhythm.     Heart sounds: No murmur heard.   Pulmonary:     Effort: Pulmonary effort is normal. No respiratory distress.     Breath sounds: Normal breath sounds.     Comments: Patient has a dialysis port which is temporary in her right anterior chest area. Chest:     Chest wall: No tenderness.  Abdominal:     Palpations: Abdomen is soft.     Tenderness: There is no  abdominal tenderness.  Musculoskeletal:        General: No swelling or edema. Normal range of motion.     Cervical back: Neck supple.     Comments: Patient has AV fistula in her left upper arm with good thrill that was used today.  Skin:    General: Skin is warm and dry.     Capillary Refill: Capillary refill takes less than 2 seconds.  Neurological:     General: No focal deficit present.     Mental Status: She is alert and oriented to person, place, and time.     Cranial Nerves: No cranial nerve deficit.     Sensory: No sensory deficit.  Psychiatric:        Mood and Affect: Mood and affect normal.     ED Results / Procedures / Treatments   Labs (all labs ordered are listed, but only abnormal results are displayed) Labs Reviewed  CBC WITH DIFFERENTIAL/PLATELET - Abnormal; Notable for the following components:      Result Value   WBC 14.8 (*)  RBC 2.93 (*)    Hemoglobin 11.0 (*)    HCT 35.1 (*)    MCV 119.8 (*)    MCH 37.5 (*)    RDW 19.6 (*)    Neutro Abs 12.3 (*)    Lymphs Abs 0.6 (*)    Eosinophils Absolute 0.6 (*)    Abs Immature Granulocytes 0.62 (*)    All other components within normal limits  COMPREHENSIVE METABOLIC PANEL - Abnormal; Notable for the following components:   Sodium 134 (*)    Potassium 3.3 (*)    Chloride 95 (*)    Glucose, Bld 146 (*)    BUN 24 (*)    Creatinine, Ser 4.85 (*)    GFR, Estimated 9 (*)    All other components within normal limits  TROPONIN I (HIGH SENSITIVITY) - Abnormal; Notable for the following components:   Troponin I (High Sensitivity) 45 (*)    All other components within normal limits  TROPONIN I (HIGH SENSITIVITY) - Abnormal; Notable for the following components:   Troponin I (High Sensitivity) 54 (*)    All other components within normal limits  TROPONIN I (HIGH SENSITIVITY) - Abnormal; Notable for the following components:   Troponin I (High Sensitivity) 57 (*)    All other components within normal limits  TROPONIN  I (HIGH SENSITIVITY)    EKG EKG Interpretation  Date/Time:  Monday August 10 2020 15:49:45 EST Ventricular Rate:  128 PR Interval:    QRS Duration: 142 QT Interval:  376 QTC Calculation: 549 R Axis:   -22 Text Interpretation: Sinus tachycardia Left bundle branch block ST-t abnormality Confirmed by Fredia Sorrow [phone]) on 08/10/2020 4:03:10 PM   Radiology DG Chest Port 1 View  Result Date: 08/10/2020 CLINICAL DATA:  Chest pain EXAM: PORTABLE CHEST 1 VIEW COMPARISON:  06/13/2020 FINDINGS: Lungs are clear. No pneumothorax or pleural effusion. Coronary artery bypass grafting has been performed. Cardiac size within normal limits. Right internal jugular hemodialysis catheter tip within the right atrium. No acute bone abnormality. IMPRESSION: No active disease. Electronically Signed   By: Fidela Salisbury MD   On: 08/10/2020 16:02    Procedures Procedures   Medications Ordered in ED Medications  aspirin chewable tablet 324 mg (324 mg Oral Given 08/10/20 1800)    ED Course  I have reviewed the triage vital signs and the nursing notes.  Pertinent labs & imaging results that were available during my care of the patient were reviewed by me and considered in my medical decision making (see chart for details).    MDM Rules/Calculators/A&P                         Patient's EKG without any acute changes.  Patient's troponins started at 56 and crept up to 57.  Had 3 done middle one was 54.  No significant jumps but are creeping up so discussed with cardiology.  Patient is also had no chest pain here at all.  Cardiology agrees that she is safe for discharge home in the follow-up for some antianginal treatment in the office later this week.  They will arrange for her to be called.  Patient stable for discharge home.  Chest x-ray without any acute findings.  Final Clinical Impression(s) / ED Diagnoses Final diagnoses:  Angina pectoris [facility])    Rx / DC Orders ED Discharge Orders    None        Fredia Sorrow, MD 08/10/20 2334

## 2020-08-10 NOTE — Telephone Encounter (Signed)
Spoke with her nurse at dialysis - she had not told them about this, just called the office instead.  Nurse will speak with her & evaluate symptoms there since she is in their care currently.

## 2020-08-10 NOTE — Discharge Instructions (Addendum)
Cleared by cardiology for discharge home.  They will have your cardiology office contact you for follow-up.  They should be seeing you later this week.  Return for any new or worse symptoms.  Continue to get your dialysis as scheduled.

## 2020-08-10 NOTE — ED Triage Notes (Signed)
Pt brought in by EMS from dialysis. Pt reports cp that started this morning on the left side .Took one nitro at home with relief. Pt went to dialysis and was able to complete dialysis. CP returned started again and was given nitro with relief Pain was between shoulder blades.

## 2020-08-10 NOTE — Telephone Encounter (Signed)
Per phone call from pt- she's currently at Dialysis having chest pain.

## 2020-08-11 ENCOUNTER — Ambulatory Visit (HOSPITAL_COMMUNITY)
Admission: RE | Admit: 2020-08-11 | Discharge: 2020-08-11 | Disposition: A | Discharge status: Home / Self Care | Payer: Medicare HMO | Source: Ambulatory Visit | Attending: Medical | Admitting: Medical

## 2020-08-11 DIAGNOSIS — N186 End stage renal disease: Secondary | ICD-10-CM | POA: Insufficient documentation

## 2020-08-11 DIAGNOSIS — Z4901 Encounter for fitting and adjustment of extracorporeal dialysis catheter: Secondary | ICD-10-CM | POA: Insufficient documentation

## 2020-08-11 HISTORY — PX: IR REMOVAL TUN CV CATH W/O FL: IMG2289

## 2020-08-11 MED ORDER — LIDOCAINE HCL 1 % IJ SOLN
INTRAMUSCULAR | Status: AC
  Filled 2020-08-11: qty 20

## 2020-08-11 MED ORDER — CHLORHEXIDINE GLUCONATE 4 % EX LIQD
CUTANEOUS | Status: AC
  Filled 2020-08-11: qty 15

## 2020-08-11 NOTE — Procedures (Signed)
PROCEDURE SUMMARY:  Successful removal of right IJ tunneled HD catheter, removed intact. No immediate complications.  EBL < 2 mL. Patient tolerated well.  Pressure dressing (gauze and tegaderm) applied to site.  Discharge instructions: 1- Ok to shower 24 hours post-removal. 2- No submerging (swimming, bathing) for 7 days post-removal.  Please see imaging section of Epic for full dictation.   Claris Pong Laithan Conchas PA-C 08/11/2020 2:21 PM

## 2020-08-14 ENCOUNTER — Encounter (INDEPENDENT_AMBULATORY_CARE_PROVIDER_SITE_OTHER): Payer: Self-pay

## 2020-08-17 NOTE — Progress Notes (Signed)
Cardiology Office Note  Date: 08/20/2020   ID: Alahna, Dunne Nov 28, 1940, MRN [id number]  PCP:  [facility]  Cardiologist:  No primary care provider on file. Electrophysiologist:  None   Chief Complaint: Follow-up CAD  History of Present Illness: Shawna [name] is a 80 y.o. female with a history of CAD (status post CABG 2013, DES to D1 10/2017, with patent LIMA-LAD, SVG-RI, SVG-MRG and SVG-RCA).  Recent presentation to [facility] emergency department on 08/10/2020.  She had called the office earlier in the day stating she had been having chest pain while in dialysis.  She presented that morning to ED and stated she had some anterior chest pain more left than right lasted 15 minutes and resolved with nitroglycerin.  At the end of dialysis that day she had some recurrent chest pain which may have lasted 20 minutes.  It resolved with nitroglycerin.  She did not have any chest pain while in the emergency room.  Troponins were 45-54-57.  Chest x-ray was negative.  She was given 1 oral dose of 324 mg aspirin.  ED provider spoke with cardiology.  Cardiology agreed she was safe for discharge and to be seen later on in the week for some antianginal treatment.  She is here for ED follow-up for chest pain while having dialysis.  During dialysis she called the office and stated she was having chest pain.  She was advised to speak to the staff in dialysis for treatment.  She eventually presented to the emergency room.  Had some mildly elevated troponins.  She continues to have episodic chest pain which appears at random at rest and with dialysis.  States sometimes it she has chest pain when walking around in her home.  States it is usually resolved with 1 sublingual nitroglycerin.  She is currently on Imdur 60 mg p.o. twice daily.  Today she denies any anginal or exertional symptoms, orthostatic symptoms, CVA or TIA-like symptoms.  She states after her most recent dialysis session she felt  exceptionally fatigued after the fact.  She states she believed she had too much fluid removed.   Past Medical History:  Diagnosis Date   Acute on chronic respiratory failure with hypoxia ([place]) 10/10/2016   Anxiety    Arthritis    AVM (arteriovenous malformation) of colon    CAD (coronary artery disease)    a. s/p CABG in 2013 b. DES to D1 in 10/2016. c. cath in 07/2018 showing patent grafts with occlusion of D1 at prior stent site and progression of PDA disease --> medical management recommended   Carotid artery disease ([place])    a. 81-44% LICA, 02/1855    Chronic anemia    Chronic bronchitis (HCC)    Chronic diastolic CHF (congestive heart failure) ([place])    a. 02/2012 Echo EF 60-65%, nl wall motion, Gr 1 DD, mod MR   Colon cancer ([place]) 1992   Esophageal stricture    ESRD on hemodialysis ([place])    ESRD due to HTN, started dialysis 2011 and gets HD at [facility] with Dr Hinda Lenis on MWF schedule.  Access is LUA AVF as of Sept 2014.    GERD (gastroesophageal reflux disease)    High cholesterol 12/2011   History of blood transfusion 07/2011; 12/2011; 01/2012 X 2; 04/2012   History of gout    History of lower GI bleeding    Hypertension    Iron deficiency anemia    Mitral regurgitation    a. Moderate  by echo, 02/2012   Myocardial infarction [facility])    NSVT (nonsustained ventricular tachycardia) ([place])    Ovarian cancer ([place]) 1992   PAF (paroxysmal atrial fibrillation) ([place])    Pneumonia ~ 2009   PUD (peptic ulcer disease)    TIA (transient ischemic attack)     Past Surgical History:  Procedure Laterality Date   A/V SHUNTOGRAM Left 03/19/2019   Procedure: A/V SHUNTOGRAM;  Surgeon: Katha Cabal, MD;  Location: [place] CV LAB;  Service: Cardiovascular;  Laterality: Left;   ABDOMINAL HYSTERECTOMY  1992   APPENDECTOMY  06/1990   AV FISTULA PLACEMENT  07/2009   left upper arm   AV FISTULA PLACEMENT Right 09/06/2016   Procedure: RIGHT FOREARM  ARTERIOVENOUS (AV) GRAFT;  Surgeon: Elam Dutch, MD;  Location: [place];  Service: Vascular;  Laterality: Right;   AV FISTULA PLACEMENT N/A 02/24/2017   Procedure: INSERTION OF ARTERIOVENOUS (AV) GORE-TEX GRAFT ARM (BRACHIAL AXILLARY);  Surgeon: Katha Cabal, MD;  Location: [facility] ORS;  Service: Vascular;  Laterality: N/A;   [place] Right 09/06/2016   Procedure: REMOVAL OF Right Arm ARTERIOVENOUS GORETEX GRAFT and Vein Patch angioplasty of brachial artery;  Surgeon: Angelia Mould, MD;  Location: [place];  Service: Vascular;  Laterality: Right;   BIOPSY  09/26/2019   Procedure: BIOPSY;  Surgeon: Rogene Houston, MD;  [facility];  Service: Endoscopy;;   COLON RESECTION  1992   COLON SURGERY     COLONOSCOPY N/A 03/09/2019   Procedure: COLONOSCOPY;  Surgeon: Rogene Houston, MD;  [facility];  Service: Endoscopy;  Laterality: N/A;   CORONARY ANGIOPLASTY WITH STENT PLACEMENT  12/15/11   "2"   CORONARY ANGIOPLASTY WITH STENT PLACEMENT  y/2013   "1; makes total of 3" (05/02/2012)   CORONARY ARTERY BYPASS GRAFT  06/13/2012   Procedure: CORONARY ARTERY BYPASS GRAFTING (CABG);  Surgeon: Grace Isaac, MD;  Location: [place];  Service: Open Heart Surgery;  Laterality: N/A;  cabg x four;  using left internal mammary artery, and left leg greater saphenous vein harvested endoscopically   CORONARY STENT INTERVENTION N/A 10/13/2016   Procedure: Coronary Stent Intervention;  Surgeon: Troy Sine, MD;  Location: [place] CV LAB;  Service: Cardiovascular;  Laterality: N/A;   DIALYSIS/PERMA CATHETER REMOVAL N/A 04/18/2017   Procedure: DIALYSIS/PERMA CATHETER REMOVAL;  Surgeon: Katha Cabal, MD;  Location: [place] CV LAB;  Service: Cardiovascular;  Laterality: N/A;   DILATION AND CURETTAGE OF UTERUS     ESOPHAGOGASTRODUODENOSCOPY  01/20/2012   Procedure: ESOPHAGOGASTRODUODENOSCOPY (EGD);  Surgeon: Ladene Artist, MD,FACG;  Location: [facility]COPY;   Service: Endoscopy;  Laterality: N/A;   ESOPHAGOGASTRODUODENOSCOPY N/A 03/26/2013   Procedure: ESOPHAGOGASTRODUODENOSCOPY (EGD);  Surgeon: Irene Shipper, MD;  Location: [facility] ENDOSCOPY;  Service: Endoscopy;  Laterality: N/A;   ESOPHAGOGASTRODUODENOSCOPY N/A 04/30/2015   Procedure: ESOPHAGOGASTRODUODENOSCOPY (EGD);  Surgeon: Rogene Houston, MD;  [facility];  Service: Endoscopy;  Laterality: N/A;  1pm - moved to 10/20 @ 1:10   ESOPHAGOGASTRODUODENOSCOPY N/A 07/29/2016   Procedure: ESOPHAGOGASTRODUODENOSCOPY (EGD);  Surgeon: Manus Gunning, MD;  Location: [place];  Service: Gastroenterology;  Laterality: N/A;  enteroscopy   ESOPHAGOGASTRODUODENOSCOPY N/A 09/26/2019   Procedure: ESOPHAGOGASTRODUODENOSCOPY (EGD);  Surgeon: Rogene Houston, MD;  [facility];  Service: Endoscopy;  Laterality: N/A;  [place] 03/07/2019   Procedure: GIVENS CAPSULE STUDY;  Surgeon: Rogene Houston, MD;  [facility];  Service: Endoscopy;  Laterality: N/A;  7:30   INTRAOPERATIVE TRANSESOPHAGEAL ECHOCARDIOGRAM  06/13/2012   Procedure: INTRAOPERATIVE TRANSESOPHAGEAL ECHOCARDIOGRAM;  Surgeon: Grace Isaac, MD;  Location: [place];  Service: Open Heart Surgery;  Laterality: N/A;   IR FLUORO GUIDE CV LINE RIGHT  06/17/2020   IR GENERIC HISTORICAL  07/26/2016   IR FLUORO GUIDE CV LINE RIGHT 07/26/2016 Greggory Keen, MD [facility]   IR GENERIC HISTORICAL  07/26/2016   IR US GUIDE VASC ACCESS RIGHT 07/26/2016 Greggory Keen, MD [facility]   IR GENERIC HISTORICAL  08/02/2016   IR US GUIDE VASC ACCESS RIGHT 08/02/2016 Greggory Keen, MD [facility]   IR GENERIC HISTORICAL  08/02/2016   IR FLUORO GUIDE CV LINE RIGHT 08/02/2016 Greggory Keen, MD [facility]   IR RADIOLOGY PERIPHERAL GUIDED IV START  03/28/2017   IR REMOVAL TUN CV CATH W/O FL  08/11/2020   IR THROMBECTOMY AV FISTULA W/THROMBOLYSIS INC/SHUNT/IMG LEFT Left 06/17/2020   IR US GUIDE VASC  ACCESS LEFT  06/17/2020   IR US GUIDE VASC ACCESS RIGHT  03/28/2017   IR US GUIDE VASC ACCESS RIGHT  06/17/2020   LEFT HEART CATH AND CORONARY ANGIOGRAPHY N/A 09/20/2016   Procedure: Left Heart Cath and Coronary Angiography;  Surgeon: [name], MD;  Location: [place] CV LAB;  Service: Cardiovascular;  Laterality: N/A;   LEFT HEART CATH AND CORS/GRAFTS ANGIOGRAPHY N/A 10/13/2016   Procedure: Left Heart Cath and Cors/Grafts Angiography;  Surgeon: Troy Sine, MD;  Location: [place] CV LAB;  Service: Cardiovascular;  Laterality: N/A;   LEFT HEART CATH AND CORS/GRAFTS ANGIOGRAPHY N/A 07/13/2018   Procedure: LEFT HEART CATH AND CORS/GRAFTS ANGIOGRAPHY;  Surgeon: Martinique, Peter M, MD;  Location: [place] CV LAB;  Service: Cardiovascular;  Laterality: N/A;   LEFT HEART CATHETERIZATION WITH CORONARY ANGIOGRAM N/A 12/15/2011   Procedure: LEFT HEART CATHETERIZATION WITH CORONARY ANGIOGRAM;  Surgeon: Burnell Blanks, MD;  Location: [facility] CATH LAB;  Service: Cardiovascular;  Laterality: N/A;   LEFT HEART CATHETERIZATION WITH CORONARY ANGIOGRAM N/A 01/10/2012   Procedure: LEFT HEART CATHETERIZATION WITH CORONARY ANGIOGRAM;  Surgeon: Peter M Martinique, MD;  Location: [facility] CATH LAB;  Service: Cardiovascular;  Laterality: N/A;   LEFT HEART CATHETERIZATION WITH CORONARY ANGIOGRAM N/A 06/08/2012   Procedure: LEFT HEART CATHETERIZATION WITH CORONARY ANGIOGRAM;  Surgeon: Burnell Blanks, MD;  Location: [facility] CATH LAB;  Service: Cardiovascular;  Laterality: N/A;   LEFT HEART CATHETERIZATION WITH CORONARY/GRAFT ANGIOGRAM N/A 12/10/2013   Procedure: LEFT HEART CATHETERIZATION WITH Beatrix Fetters;  Surgeon: [name], MD;  Location: [facility] CATH LAB;  Service: Cardiovascular;  Laterality: N/A;   OVARY SURGERY     ovarian cancer   POLYPECTOMY  03/09/2019   Procedure: POLYPECTOMY;  Surgeon: Rogene Houston, MD;  [facility];  Service: Endoscopy;;  cecal    POLYPECTOMY N/A  09/26/2019   Procedure: DUODENAL POLYPECTOMY;  Surgeon: Rogene Houston, MD;  [facility];  Service: Endoscopy;  Laterality: N/A;   REVISION OF ARTERIOVENOUS GORETEX GRAFT N/A 02/24/2017   Procedure: REVISION OF ARTERIOVENOUS GORETEX GRAFT (RESECTION);  Surgeon: Katha Cabal, MD;  Location: [facility] ORS;  Service: Vascular;  Laterality: N/A;   REVISON OF ARTERIOVENOUS FISTULA Left 06/19/2020   Procedure: REVISION OF LEFT UPPER ARM AV GRAFT WITH INTERPOSITION JUMP GRAFT USING 6MM GORE LIMB;  Surgeon: Marty Heck, MD;  Location: [place];  Service: Vascular;  Laterality: Left;   SHUNTOGRAM N/A 10/15/2013   Procedure: Fistulogram;  Surgeon: Serafina Mitchell, MD;  Location: [facility] CATH LAB;  Service: Cardiovascular;  Laterality: N/A;   THROMBECTOMY / ARTERIOVENOUS GRAFT REVISION  2011   left upper arm   TUBAL LIGATION  1980's   UPPER EXTREMITY ANGIOGRAPHY Bilateral 12/06/2016   Procedure: Upper Extremity Angiography;  Surgeon: Katha Cabal, MD;  Location: [place] CV LAB;  Service: Cardiovascular;  Laterality: Bilateral;   UPPER EXTREMITY INTERVENTION Left 06/06/2017   Procedure: UPPER EXTREMITY INTERVENTION;  Surgeon: Katha Cabal, MD;  Location: [place] CV LAB;  Service: Cardiovascular;  Laterality: Left;    Current Outpatient Medications  Medication Sig Dispense Refill   albuterol (VENTOLIN HFA) 108 (90 Base) MCG/ACT inhaler Inhale 2 puffs into the lungs every 6 (six) hours as needed for wheezing or shortness of breath.     amiodarone (PACERONE) 200 MG tablet Take 1 tablet (200 mg total) by mouth daily. Starting July 2 90 tablet 3   aspirin EC 81 MG tablet Take 81 mg by mouth daily.      cyclobenzaprine (FLEXERIL) 5 MG tablet Take 5 mg by mouth 3 (three) times daily as needed.     dicyclomine (BENTYL) 10 MG capsule Take 10 mg by mouth 2 (two) times daily.     fluticasone (FLONASE) 50 MCG/ACT nasal spray Place 1 spray into both nostrils at bedtime  as needed for allergies.      gabapentin (NEURONTIN) 100 MG capsule Take 100 mg by mouth daily.      isosorbide mononitrate (IMDUR) 60 MG 24 hr tablet Take 1 tablet (60 mg total) by mouth 2 (two) times daily. 60 tablet 6   lidocaine-prilocaine (EMLA) cream Apply 1 application topically every Monday, Wednesday, and Friday. Prior to dialysis     loratadine (CLARITIN) 10 MG tablet Take 1 tablet (10 mg total) by mouth daily as needed for allergies. 30 tablet 0   multivitamin (RENA-VIT) TABS tablet Take 1 tablet by mouth daily.     nitroGLYCERIN (NITROSTAT) 0.4 MG SL tablet Place 1 tablet (0.4 mg total) under the tongue every 5 (five) minutes as needed for chest pain. 15 tablet 1   omeprazole (PRILOSEC) 40 MG capsule Take 1 capsule (40 mg total) by mouth daily before breakfast. 30 capsule 5   sevelamer carbonate (RENVELA) 800 MG tablet Take 2 tablets (1,600 mg total) by mouth 3 (three) times daily with meals. 180 tablet 1   simvastatin (ZOCOR) 20 MG tablet Take 1 tablet (20 mg total) by mouth at bedtime. 90 tablet 3   No current facility-administered medications for this visit.   Allergies:  Aspirin, Penicillins, Amlodipine, Bactrim [sulfamethoxazole-trimethoprim], Contrast media [iodinated diagnostic agents], Iron, Nitrofurantoin, Tylenol [acetaminophen], Gabapentin, Hydralazine, Dexilant [dexlansoprazole], Levaquin [levofloxacin in d5w], Morphine and related, Plavix [clopidogrel bisulfate], Protonix [pantoprazole sodium], and Venofer [ferric oxide]   Social History: The patient  reports that she has never smoked. She has never used smokeless tobacco. She reports that she does not drink alcohol and does not use drugs.   Family History: The patient's family history includes Colon cancer (age of onset: 66) in her child; Diabetes in her brother, father, mother, and sister; Heart attack in her brother, brother, and mother; Heart disease in her father and mother; Hyperlipidemia in her brother,  father, and mother; Hypertension in her father, mother, sister, and sister; Other in an other family member.   ROS:  Please see the history of present illness. Otherwise, complete review of systems is positive for none.  All other systems are reviewed  and negative.   Physical Exam: VS:  BP (!) 144/60    Pulse 73    Ht 5\' 1"  (1.549 m)    Wt 141 lb 6.4 oz (64.1 kg)    SpO2 94%    BMI 26.72 kg/m , BMI Body mass index is 26.72 kg/m.  Wt Readings from Last 3 Encounters:  08/20/20 141 lb 6.4 oz (64.1 kg)  08/10/20 139 lb 15.9 oz (63.5 kg)  07/31/20 139 lb 1.8 oz (63.1 kg)    General: Patient appears comfortable at rest. Neck: Supple, no elevated JVP or carotid bruits, no thyromegaly. Lungs: Clear to auscultation, nonlabored breathing at rest. Cardiac: Regular rate and rhythm, no S3 or significant systolic murmur, no pericardial rub. Extremities: No pitting edema, distal pulses 2+. Skin: Warm and dry. Musculoskeletal: No kyphosis. Neuropsychiatric: Alert and oriented x3, affect grossly appropriate.  ECG:  EKG 02/12/2020 sinus rhythm 71, probable LAE, LVH with secondary repolarization abnormality, anterior infarct, old  Recent Labwork: 05/07/2020: Magnesium 1.7 06/14/2020: TSH 5.792 08/10/2020: ALT 17; AST 15; BUN 24; Creatinine, Ser 4.85; Hemoglobin 11.0; Platelets 311; Potassium 3.3; Sodium 134     Component Value Date/Time   CHOL 136 06/13/2020 0352   TRIG 110 06/13/2020 0352   HDL 50 06/13/2020 0352   CHOLHDL 2.7 06/13/2020 0352   VLDL 22 06/13/2020 0352   LDLCALC 64 06/13/2020 0352    Other Studies Reviewed Today:   Cardiac Catheterization: 07/2018  Ost RPDA to RPDA lesion is 75% stenosed.  Mid LM to Dist LM lesion is 60% stenosed.  Ost Ramus to Ramus lesion is 80% stenosed.  Ost Cx to Mid Cx lesion is 99% stenosed.  Prox LAD lesion is 90% stenosed.  Ost 1st Diag to 1st Diag lesion is 100% stenosed.  Ost RCA to Prox RCA lesion is 100% stenosed.  LIMA graft was  visualized by angiography and is large.  The graft exhibits no disease.  SVG graft was visualized by angiography and is large.  The graft exhibits no disease.  SVG graft was visualized by angiography and is normal in caliber.  SVG graft was visualized by angiography and is large.  The graft exhibits no disease.  The left ventricular systolic function is normal.  LV end diastolic pressure is mildly elevated.  The left ventricular ejection fraction is 55-65% by visual estimate.  1. Severe 3 vessel occlusive CAD - 60% distal left main - 90% mid LAD - 100% first diagonal at prior stent site - 99% proximal LCx at prior stent site - 80% ramus intermediate. - 100% ostial RCA at prior stent site - 75% proximal PDA 2. Patent LIMA to the LAD 3. Patent SVG to ramus intermediate 4. Patent SVG to OM 5. Patent SVG to the distal RCA. 6. Good LV function 7. Mildly elevated LVEDP  Plan: Compared to prior cath in April 2018 the diagonal is now occluded. There is progression of disease in the PDA. It is unclear if this is the reason for her chest pain. She is severely hypertensive and EDP is mildly elevated. I would recommend medical management with good BP control. If she has refractory angina despite good BP and volume management we could consider PCI of the PDA but this would have to be done through the SVG and in general she has not responded well to stenting in the past with all prior stents occluded. I will increase her Coreg dose. She will need dialysis in am.  Diagnostic Dominance: Right  Echocardiogram: 12/2018 IMPRESSIONS    1. The left ventricle has normal systolic function with an ejection  fraction of 60-65%. The cavity size was normal. There is mild concentric  left ventricular hypertrophy. Left ventricular diastolic Doppler  parameters are consistent with impaired  relaxation. Elevated left ventricular end-diastolic pressure No evidence   of left ventricular regional wall motion abnormalities.  2. Left atrial size was mildly dilated.  3. The mitral valve is grossly normal. Mild thickening of the mitral  valve leaflet. There is mild mitral annular calcification present. Mitral  valve regurgitation is mild to moderate by color flow Doppler.  4. The tricuspid valve is grossly normal.  5. The aortic valve is tricuspid. Mild thickening of the aortic valve.  6. Moderate aortic annular calcification.  7. The aortic root is normal in size and structure.    Assessment and Plan:   1. CAD in native artery/chest pain Denies any anginal or exertional symptoms today.  Recent emergency room visit for chest pain.  Ruled out for ACS.  Continue aspirin 81 mg daily.  Imdur 60 mg p.o. twice daily.  Nitroglycerin sublingual as needed for chest pain.  Will add Ranexa 500 mg p.o. twice daily to help with anginal symptoms.  2. Paroxysmal atrial fibrillation (HCC) Denies any recent palpitations or arrhythmias.  Continue amiodarone 200 mg p.o. daily.  3. Mixed hyperlipidemia Continue simvastatin 20 mg p.o. daily.  She has a lipid profile ordered on 12/17/2019 but not done yet.  4. Essential hypertension Blood pressure today is 144/60.Marland Kitchen  She states her blood pressure fluctuates.  She states does not tolerate lower blood pressures approaching 675 systolic.  She states she feels presyncopal when this occurs.    5. ESRD (end stage renal disease) on dialysis [facility]) On HD Monday Wednesday and Friday.  Follows with Dr. Theador Hawthorne.   Medication Adjustments/Labs and Tests Ordered: Current medicines are reviewed at length with the patient today.  Concerns regarding medicines are outlined above.   Disposition: Follow-up with Dr. Harl Bowie or APP 33-months  Signed, Levell July, NP 08/20/2020 3:27 PM    [place] [facility] [place], [place], [place] [address] Phone: [phone]; Fax: [phone]

## 2020-08-20 ENCOUNTER — Encounter: Payer: Self-pay | Admitting: Family Medicine

## 2020-08-20 ENCOUNTER — Telehealth (INDEPENDENT_AMBULATORY_CARE_PROVIDER_SITE_OTHER): Payer: Self-pay | Admitting: Internal Medicine

## 2020-08-20 ENCOUNTER — Ambulatory Visit: Payer: Medicare HMO | Admitting: Family Medicine

## 2020-08-20 VITALS — BP 144/60 | HR 73 | Ht 61.0 in | Wt 141.4 lb

## 2020-08-20 DIAGNOSIS — E782 Mixed hyperlipidemia: Secondary | ICD-10-CM | POA: Diagnosis not present

## 2020-08-20 DIAGNOSIS — I480 Paroxysmal atrial fibrillation: Secondary | ICD-10-CM

## 2020-08-20 DIAGNOSIS — I2510 Atherosclerotic heart disease of native coronary artery without angina pectoris: Secondary | ICD-10-CM

## 2020-08-20 DIAGNOSIS — N186 End stage renal disease: Secondary | ICD-10-CM

## 2020-08-20 DIAGNOSIS — R079 Chest pain, unspecified: Secondary | ICD-10-CM

## 2020-08-20 DIAGNOSIS — I10 Essential (primary) hypertension: Secondary | ICD-10-CM

## 2020-08-20 DIAGNOSIS — Z992 Dependence on renal dialysis: Secondary | ICD-10-CM

## 2020-08-20 MED ORDER — RANOLAZINE ER 500 MG PO TB12: 500.0000 mg | ORAL_TABLET | Freq: Two times a day (BID) | ORAL | 6 refills | Status: DC

## 2020-08-20 NOTE — Telephone Encounter (Signed)
I called the patient to discuss her symptoms, unfortunately she did not pick up the phone.  I left a detailed voice message asking her about her symptoms and to give a call back so we can discuss further.  She may need to receive treatment with antispasmodic medication as she has not improved with a course of Flagyl  Shawna Peppers, MD Gastroenterology and Hepatology [facility]

## 2020-08-20 NOTE — Telephone Encounter (Signed)
Patient called stated she is still having abdominal pain and her stomach seems to be swollen - please advise - ph# [phone]

## 2020-08-20 NOTE — Telephone Encounter (Signed)
I spoke with the patient and she states she saw her pcp on Monday and she had a yeast infection. She is having some mid lower abdominal pain, bloating, no nausea, no vomiting,diarrhea or constipation, no fever, no dark or bloody stools noticed. She states the Dr. Hanley Seamen her diflucan ,but Symptoms on going for he last week. I advised if Uti the mid abdominal could be coming from there. She states she is unsure if she has a UTI,but was told she had a yeast infection.

## 2020-08-20 NOTE — Patient Instructions (Addendum)
Medication Instructions:   Begin Ranexa 500mg  twice a day.  Continue all other medications.    Labwork: none  Testing/Procedures: none  Follow-Up: 3 months   Any Other Special Instructions Will Be Listed Below (If Applicable).  If you need a refill on your cardiac medications before your next appointment, please call your pharmacy.

## 2020-08-24 NOTE — Telephone Encounter (Signed)
I called and left a vm, asked that the patient please return call.

## 2020-08-25 ENCOUNTER — Telehealth: Payer: Self-pay | Admitting: Family Medicine

## 2020-08-25 NOTE — Telephone Encounter (Signed)
Patient called stating that needs to know if she can take 1/2 of the pill that Cutter prescribed for her.

## 2020-08-25 NOTE — Telephone Encounter (Signed)
Patient says she read the insert for ranexa and said she thought 500 mg would be to strong for her. Advised that 500 mg is the lowest dose and is okay for her to take. Verbalized understanding.

## 2020-08-25 NOTE — Telephone Encounter (Signed)
Thank you :)

## 2020-08-26 NOTE — Telephone Encounter (Signed)
I called and left a vm asked that the patient return call. 

## 2020-08-27 ENCOUNTER — Ambulatory Visit: Payer: Medicare HMO | Admitting: Cardiology

## 2020-08-27 NOTE — Telephone Encounter (Signed)
Patient would like for you to send in something for her stomach spasms to [facility]. Please

## 2020-08-27 NOTE — Telephone Encounter (Signed)
Or check informally with hydrogen breath test

## 2020-08-27 NOTE — Telephone Encounter (Signed)
Noted  

## 2020-08-27 NOTE — Telephone Encounter (Signed)
Spoke with the patient today regarding her symptoms, she reports that she has persisted with bloating even though she took her Flagyl compliantly.  It is possible that she has concomitant IBS causing this bloating persistently.  I advised her to take IBgard and to try a low FODMAP diet compliantly (patient will come on Monday to pick up the dietary list).  If this fails to improve her symptoms after a month, may need to consider a trial of Xifaxan.  [name], MD Gastroenterology and Hepatology [facility]

## 2020-08-30 ENCOUNTER — Inpatient Hospital Stay (HOSPITAL_COMMUNITY)
Admission: EM | Admit: 2020-08-30 | Discharge: 2020-09-02 | DRG: 091 | Disposition: A | Discharge status: Home Health | Payer: Medicare HMO | Attending: Internal Medicine | Admitting: Internal Medicine

## 2020-08-30 ENCOUNTER — Emergency Department (HOSPITAL_COMMUNITY): Payer: Medicare HMO

## 2020-08-30 ENCOUNTER — Encounter (HOSPITAL_COMMUNITY): Payer: Self-pay | Admitting: *Deleted

## 2020-08-30 ENCOUNTER — Other Ambulatory Visit: Payer: Self-pay

## 2020-08-30 DIAGNOSIS — R252 Cramp and spasm: Secondary | ICD-10-CM

## 2020-08-30 DIAGNOSIS — Z951 Presence of aortocoronary bypass graft: Secondary | ICD-10-CM

## 2020-08-30 DIAGNOSIS — I132 Hypertensive heart and chronic kidney disease with heart failure and with stage 5 chronic kidney disease, or end stage renal disease: Secondary | ICD-10-CM | POA: Diagnosis present

## 2020-08-30 DIAGNOSIS — Z85038 Personal history of other malignant neoplasm of large intestine: Secondary | ICD-10-CM

## 2020-08-30 DIAGNOSIS — Z8673 Personal history of transient ischemic attack (TIA), and cerebral infarction without residual deficits: Secondary | ICD-10-CM

## 2020-08-30 DIAGNOSIS — R296 Repeated falls: Secondary | ICD-10-CM | POA: Diagnosis present

## 2020-08-30 DIAGNOSIS — Z20822 Contact with and (suspected) exposure to covid-19: Secondary | ICD-10-CM | POA: Diagnosis present

## 2020-08-30 DIAGNOSIS — G253 Myoclonus: Principal | ICD-10-CM | POA: Diagnosis present

## 2020-08-30 DIAGNOSIS — R4701 Aphasia: Secondary | ICD-10-CM | POA: Diagnosis present

## 2020-08-30 DIAGNOSIS — Z8543 Personal history of malignant neoplasm of ovary: Secondary | ICD-10-CM

## 2020-08-30 DIAGNOSIS — Z955 Presence of coronary angioplasty implant and graft: Secondary | ICD-10-CM

## 2020-08-30 DIAGNOSIS — Z9049 Acquired absence of other specified parts of digestive tract: Secondary | ICD-10-CM

## 2020-08-30 DIAGNOSIS — Z886 Allergy status to analgesic agent status: Secondary | ICD-10-CM

## 2020-08-30 DIAGNOSIS — T46995A Adverse effect of other agents primarily affecting the cardiovascular system, initial encounter: Secondary | ICD-10-CM | POA: Diagnosis present

## 2020-08-30 DIAGNOSIS — I480 Paroxysmal atrial fibrillation: Secondary | ICD-10-CM | POA: Diagnosis present

## 2020-08-30 DIAGNOSIS — F419 Anxiety disorder, unspecified: Secondary | ICD-10-CM | POA: Diagnosis present

## 2020-08-30 DIAGNOSIS — Z885 Allergy status to narcotic agent status: Secondary | ICD-10-CM

## 2020-08-30 DIAGNOSIS — Z880 Allergy status to penicillin: Secondary | ICD-10-CM

## 2020-08-30 DIAGNOSIS — E875 Hyperkalemia: Secondary | ICD-10-CM | POA: Diagnosis present

## 2020-08-30 DIAGNOSIS — Z888 Allergy status to other drugs, medicaments and biological substances status: Secondary | ICD-10-CM

## 2020-08-30 DIAGNOSIS — W19XXXA Unspecified fall, initial encounter: Secondary | ICD-10-CM | POA: Diagnosis present

## 2020-08-30 DIAGNOSIS — K589 Irritable bowel syndrome without diarrhea: Secondary | ICD-10-CM | POA: Diagnosis present

## 2020-08-30 DIAGNOSIS — G629 Polyneuropathy, unspecified: Secondary | ICD-10-CM | POA: Diagnosis present

## 2020-08-30 DIAGNOSIS — Z800 Family history of malignant neoplasm of digestive organs: Secondary | ICD-10-CM

## 2020-08-30 DIAGNOSIS — M1990 Unspecified osteoarthritis, unspecified site: Secondary | ICD-10-CM | POA: Diagnosis present

## 2020-08-30 DIAGNOSIS — R251 Tremor, unspecified: Secondary | ICD-10-CM

## 2020-08-30 DIAGNOSIS — Z8249 Family history of ischemic heart disease and other diseases of the circulatory system: Secondary | ICD-10-CM

## 2020-08-30 DIAGNOSIS — Z90710 Acquired absence of both cervix and uterus: Secondary | ICD-10-CM

## 2020-08-30 DIAGNOSIS — N186 End stage renal disease: Secondary | ICD-10-CM | POA: Diagnosis present

## 2020-08-30 DIAGNOSIS — Z992 Dependence on renal dialysis: Secondary | ICD-10-CM

## 2020-08-30 DIAGNOSIS — Z7982 Long term (current) use of aspirin: Secondary | ICD-10-CM

## 2020-08-30 DIAGNOSIS — Z91041 Radiographic dye allergy status: Secondary | ICD-10-CM

## 2020-08-30 DIAGNOSIS — I252 Old myocardial infarction: Secondary | ICD-10-CM

## 2020-08-30 DIAGNOSIS — G9341 Metabolic encephalopathy: Secondary | ICD-10-CM | POA: Diagnosis present

## 2020-08-30 DIAGNOSIS — K219 Gastro-esophageal reflux disease without esophagitis: Secondary | ICD-10-CM | POA: Diagnosis present

## 2020-08-30 DIAGNOSIS — D631 Anemia in chronic kidney disease: Secondary | ICD-10-CM | POA: Diagnosis present

## 2020-08-30 DIAGNOSIS — I5032 Chronic diastolic (congestive) heart failure: Secondary | ICD-10-CM | POA: Diagnosis present

## 2020-08-30 DIAGNOSIS — I2510 Atherosclerotic heart disease of native coronary artery without angina pectoris: Secondary | ICD-10-CM | POA: Diagnosis present

## 2020-08-30 DIAGNOSIS — N2581 Secondary hyperparathyroidism of renal origin: Secondary | ICD-10-CM | POA: Diagnosis present

## 2020-08-30 DIAGNOSIS — E538 Deficiency of other specified B group vitamins: Secondary | ICD-10-CM | POA: Diagnosis present

## 2020-08-30 DIAGNOSIS — Z79899 Other long term (current) drug therapy: Secondary | ICD-10-CM

## 2020-08-30 DIAGNOSIS — Z881 Allergy status to other antibiotic agents status: Secondary | ICD-10-CM

## 2020-08-30 LAB — CBC WITH DIFFERENTIAL/PLATELET
Abs Immature Granulocytes: 0.02 10*3/uL (ref 0.00–0.07)
Basophils Absolute: 0.1 10*3/uL (ref 0.0–0.1)
Basophils Relative: 2 %
Eosinophils Absolute: 0.3 10*3/uL (ref 0.0–0.5)
Eosinophils Relative: 6 %
HCT: 34.7 % — ABNORMAL LOW (ref 36.0–46.0)
Hemoglobin: 10.5 g/dL — ABNORMAL LOW (ref 12.0–15.0)
Immature Granulocytes: 0 %
Lymphocytes Relative: 17 %
Lymphs Abs: 1 10*3/uL (ref 0.7–4.0)
MCH: 36.5 pg — ABNORMAL HIGH (ref 26.0–34.0)
MCHC: 30.3 g/dL (ref 30.0–36.0)
MCV: 120.5 fL — ABNORMAL HIGH (ref 80.0–100.0)
Monocytes Absolute: 0.5 10*3/uL (ref 0.1–1.0)
Monocytes Relative: 8 %
Neutro Abs: 4 10*3/uL (ref 1.7–7.7)
Neutrophils Relative %: 67 %
Platelets: 207 10*3/uL (ref 150–400)
RBC: 2.88 MIL/uL — ABNORMAL LOW (ref 3.87–5.11)
RDW: 17.4 % — ABNORMAL HIGH (ref 11.5–15.5)
WBC: 5.9 10*3/uL (ref 4.0–10.5)
nRBC: 0 % (ref 0.0–0.2)

## 2020-08-30 LAB — HEPATIC FUNCTION PANEL
ALT: 12 U/L (ref 0–44)
AST: 8 U/L — ABNORMAL LOW (ref 15–41)
Albumin: 3.6 g/dL (ref 3.5–5.0)
Alkaline Phosphatase: 86 U/L (ref 38–126)
Bilirubin, Direct: <0.1 mg/dL (ref 0.0–0.2)
Total Bilirubin: 0.8 mg/dL (ref 0.3–1.2)
Total Protein: 6.6 g/dL (ref 6.5–8.1)

## 2020-08-30 LAB — BASIC METABOLIC PANEL
Anion gap: 15 (ref 5–15)
BUN: 49 mg/dL — ABNORMAL HIGH (ref 8–23)
CO2: 25 mmol/L (ref 22–32)
Calcium: 9.8 mg/dL (ref 8.9–10.3)
Chloride: 96 mmol/L — ABNORMAL LOW (ref 98–111)
Creatinine, Ser: 8.68 mg/dL — ABNORMAL HIGH (ref 0.44–1.00)
GFR, Estimated: 4 mL/min — ABNORMAL LOW (ref >60)
Glucose, Bld: 84 mg/dL (ref 70–99)
Potassium: 5.2 mmol/L — ABNORMAL HIGH (ref 3.5–5.1)
Sodium: 136 mmol/L (ref 135–145)

## 2020-08-30 LAB — MAGNESIUM: Magnesium: 1.9 mg/dL (ref 1.7–2.4)

## 2020-08-30 IMAGING — DX DG CHEST 1V PORT
1 series · 1 of 1 positions shown · non-contrast
Comparison: Radiograph [DATE]

CLINICAL DATA: Bilateral leg jerking and spasm since yesterday,
fall today

EXAM:
PORTABLE CHEST 1 VIEW

[chest ap]
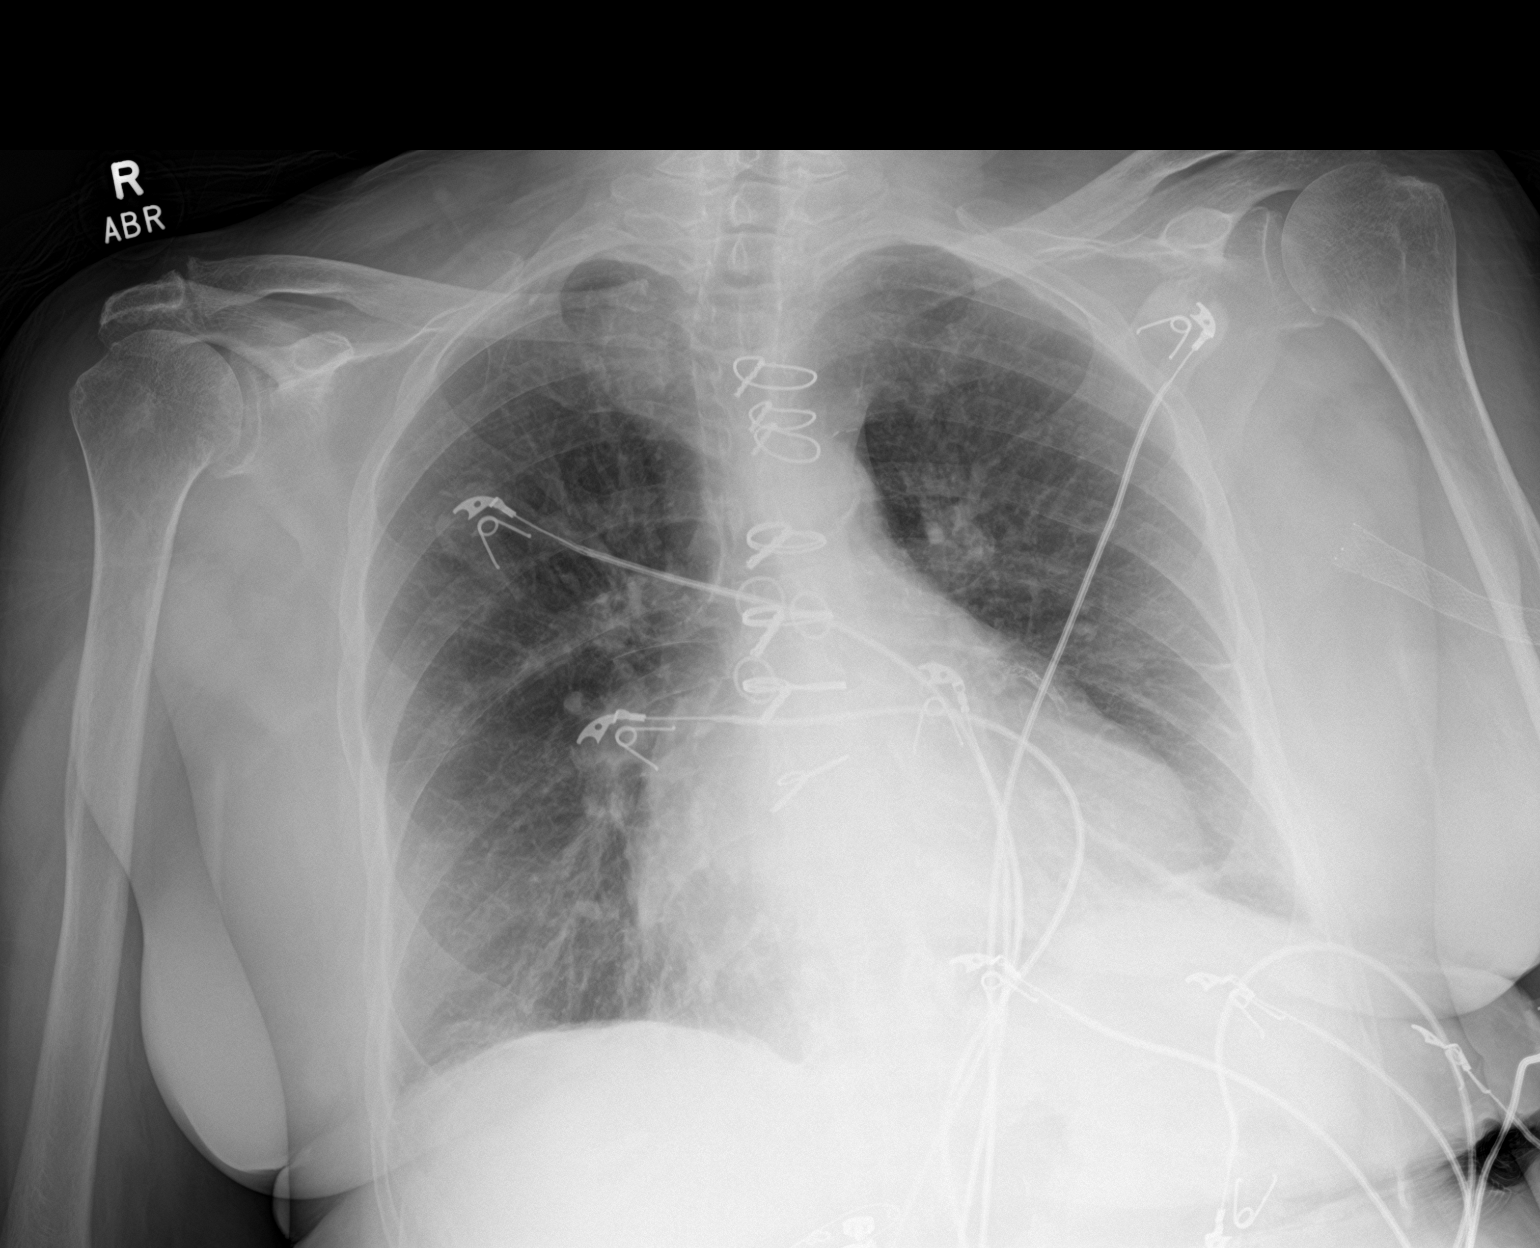

[1 of 1 positions shown; findings below may reference images not displayed]

FINDINGS: Some chronically coarsened interstitial and bronchitic changes
similar to prior studies with some additional bandlike regions of
scarring and or atelectasis in both lung bases. Stable prominent
cardiomediastinal silhouette with postsurgical changes from prior
sternotomy and CABG as well as coronary stenting. The aorta is
calcified. The remaining cardiomediastinal contours are
unremarkable. No acute osseous or soft tissue abnormality.
Degenerative changes are present in the imaged spine and shoulders.
Telemetry leads overlie the chest. Vascular stenting in the left
axilla.
IMPRESSION: 1. No acute cardiopulmonary abnormality. Chronic findings as
described above.
2. Stable postoperative changes from prior sternotomy, CABG and
coronary stenting.

## 2020-08-30 IMAGING — CT CT HEAD W/O CM
4 series · 16 of 47 positions shown, 18 images · non-contrast
Comparison: CT [DATE]

CLINICAL DATA: Neuro deficit, stroke suspected, jerking tremor with
spasm since yesterday, fall today head strike on carpeted flooring

EXAM:
CT HEAD WITHOUT CONTRAST
TECHNIQUE: Contiguous axial images were obtained from the base of the skull
through the vertex without intravenous contrast.

[Series 2: head w o · axial · 0.41mm/px · z∈[+294,+394]mm · 7 of 28 slices shown, 9 images]
[im 4/28  brain]
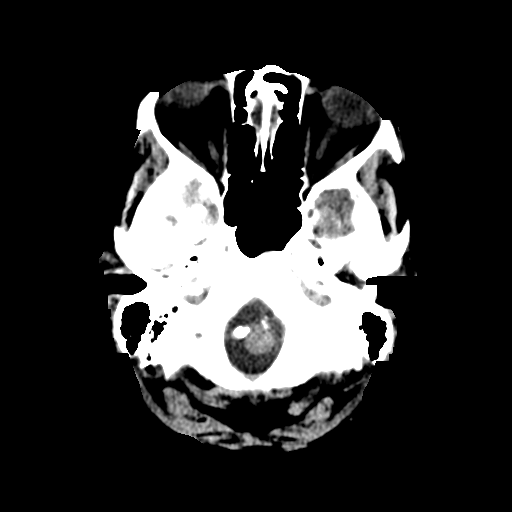
[im 4/28  bone]
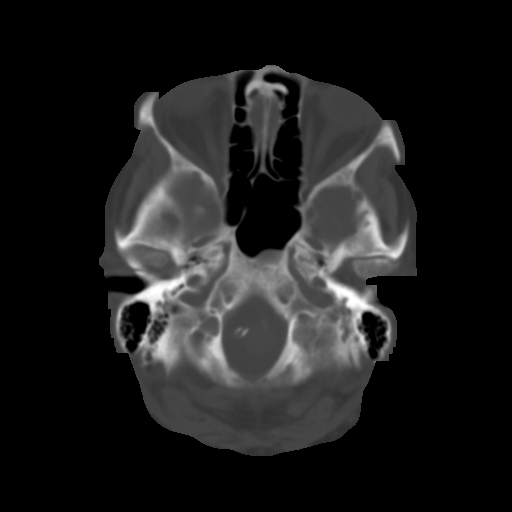
[im 7/28  brain]
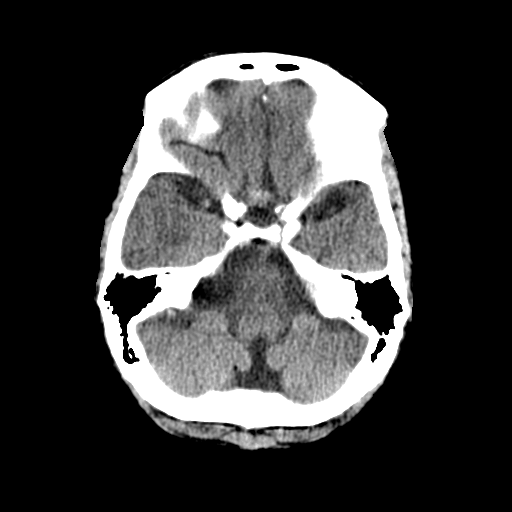
[im 11/28  brain]
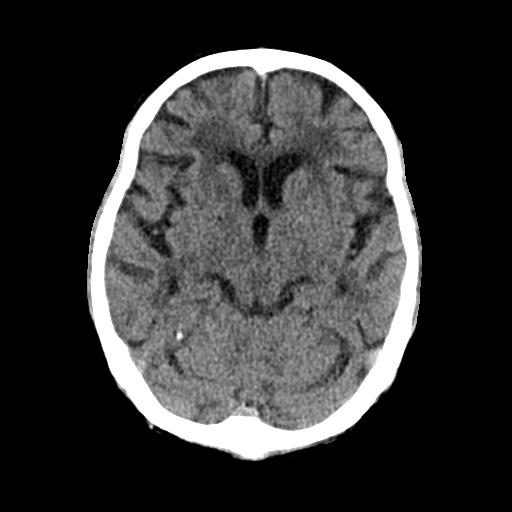
[im 14/28  brain]
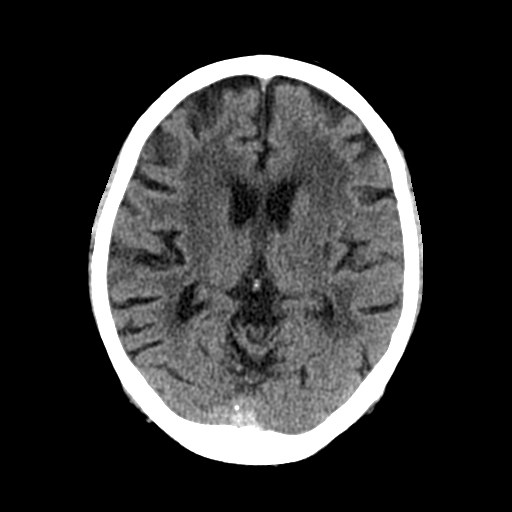
[im 17/28  brain]
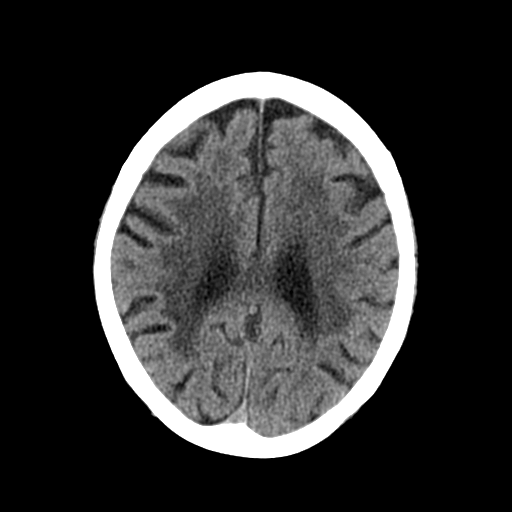
[im 17/28  bone]
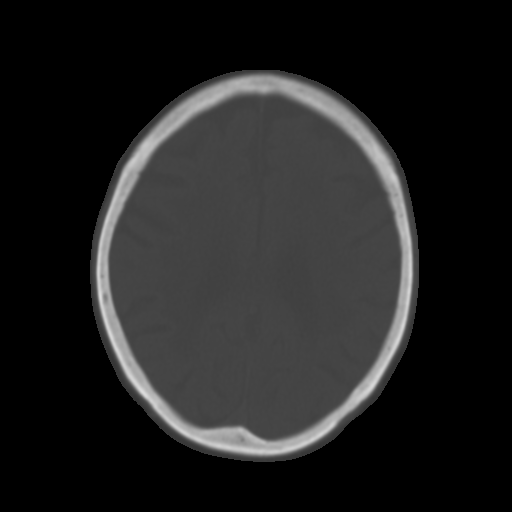
[im 21/28  brain]
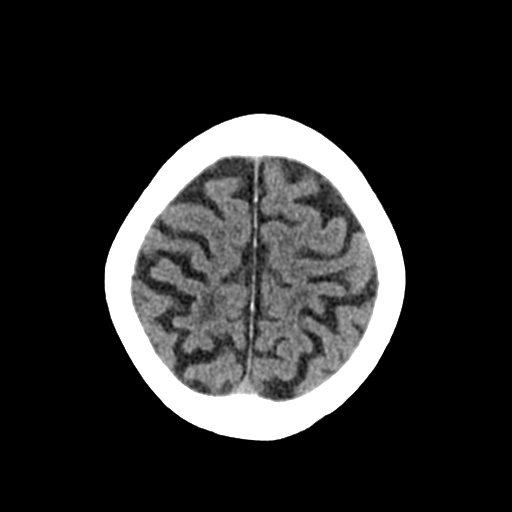
[im 24/28  brain]
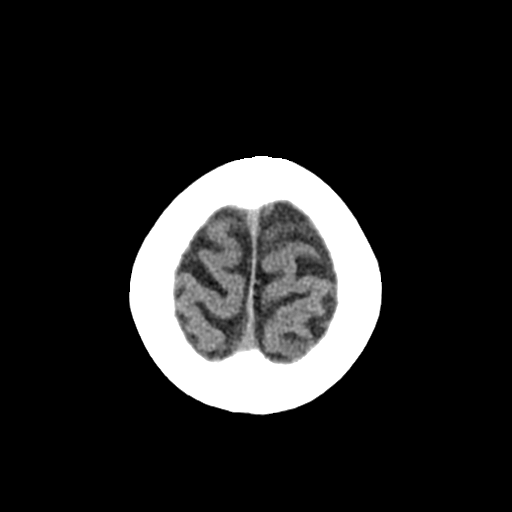

[Series 3: head bone · axial · 0.41mm/px · z∈[+291,+319]mm · 3 of 70 slices shown]
[im 7/70  bone]
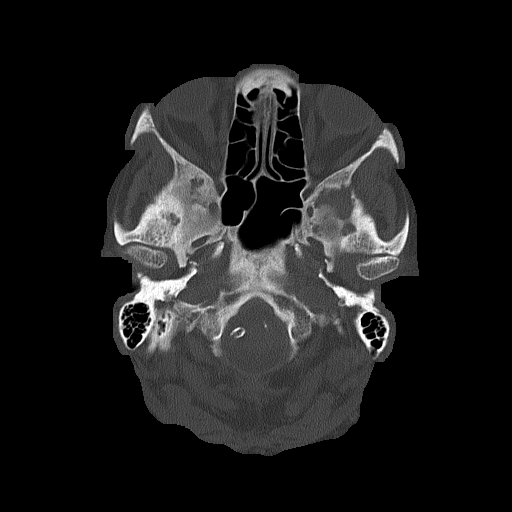
[im 14/70  bone]
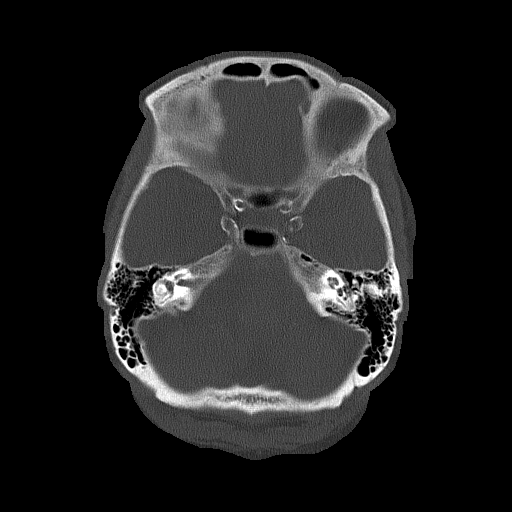
[im 21/70  bone]
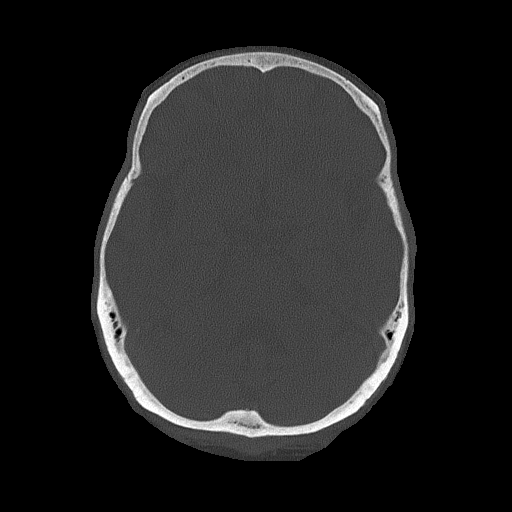

[Series 4: coronal soft · coronal · 0.30mm/px · 3 of 67 slices shown]
[im 23/67  brain]
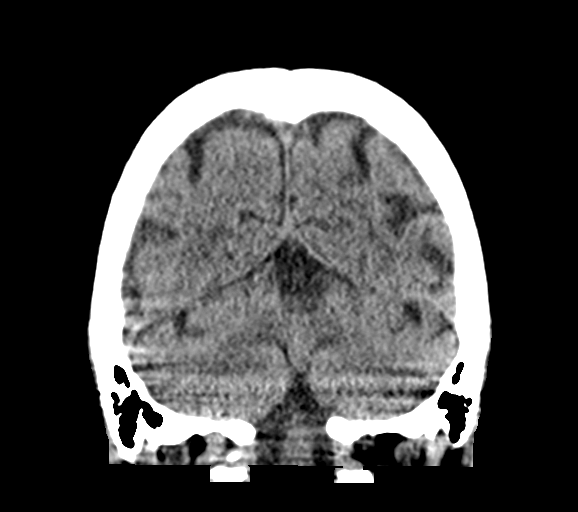
[im 30/67  brain]
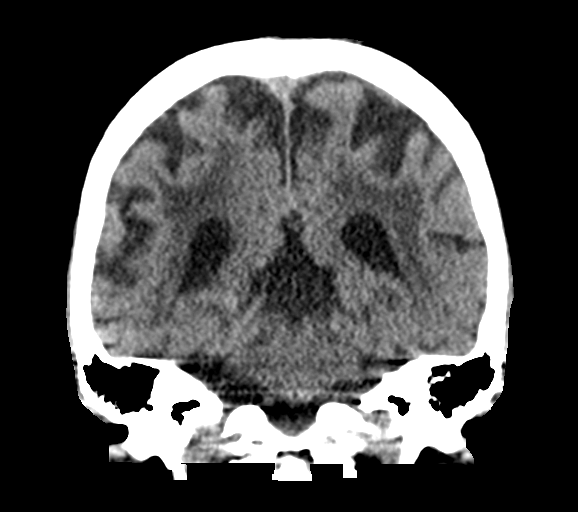
[im 37/67  brain]
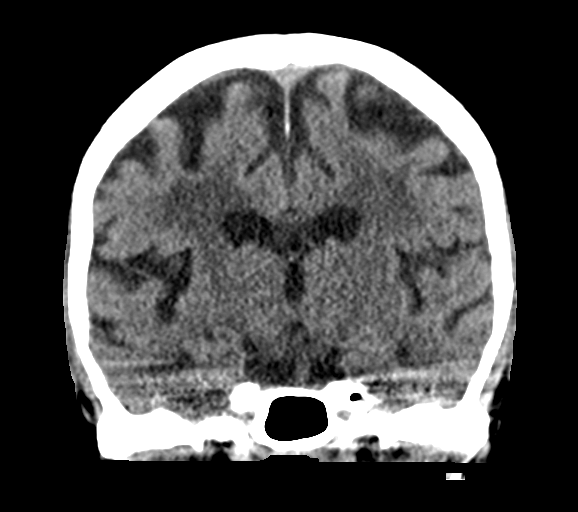

[Series 5: sagittal soft · sagittal · 0.30mm/px · 3 of 51 slices shown]
[im 17/51  brain]
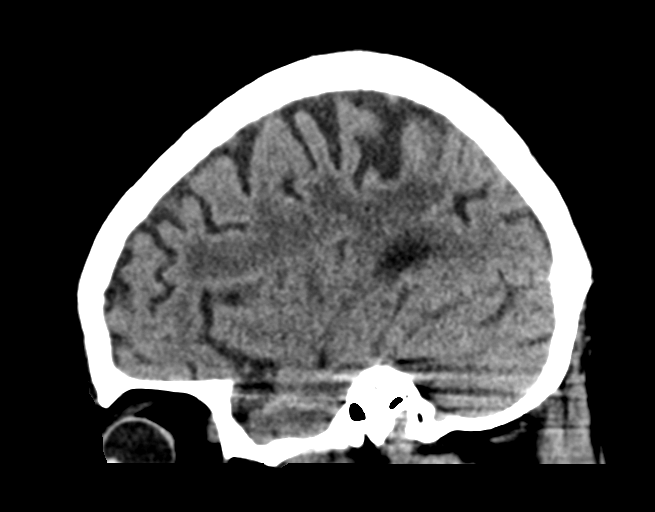
[im 26/51  brain]
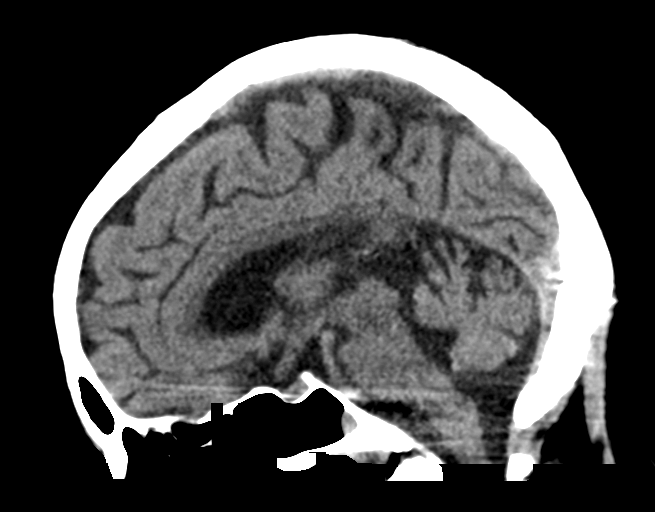
[im 34/51  brain]
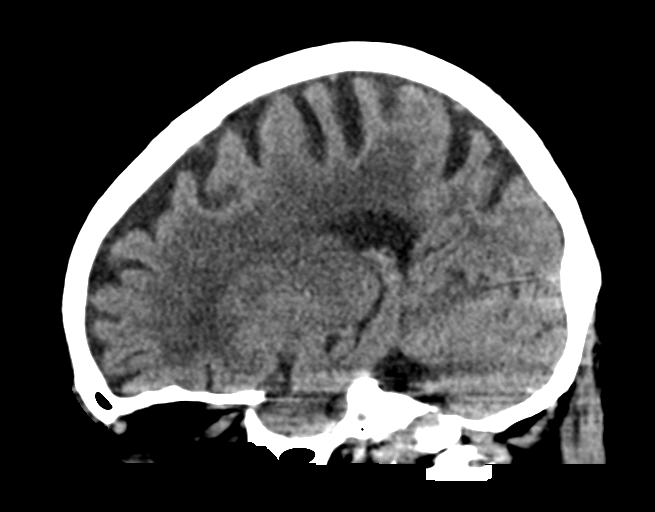

[16 of 47 positions shown; findings below may reference images not displayed]

FINDINGS: Brain: No evidence of acute infarction, hemorrhage, hydrocephalus,
extra-axial collection, visible mass lesion or mass effect.
Symmetric prominence of the ventricles, cisterns and sulci
compatible with parenchymal volume loss. Patchy areas of white
matter hypoattenuation are most compatible with chronic
microvascular angiopathy.

Vascular: Atherosclerotic calcification of the carotid siphons and
intradural vertebral arteries. No hyperdense vessel.

Skull: No significant scalp swelling or large hematoma. No calvarial
fracture or acute osseous abnormalities.

Sinuses/Orbits: Paranasal sinuses and mastoid air cells are
predominantly clear. Included orbital structures are unremarkable.

Other: Debris in the left external auditory canal.
IMPRESSION: 1. No acute intracranial abnormality. If there is persisting concern
for acute infarction, MRI is more sensitive and specific for early
features of ischemia.
2. Chronic microvascular angiopathy and parenchymal volume loss.
Intracranial atherosclerosis.
3. Debris in the left external auditory canal, correlate for cerumen
impaction.

## 2020-08-30 NOTE — ED Provider Notes (Signed)
[facility] EMERGENCY DEPARTMENT Provider Note   CSN: [id number] Arrival date & time: 08/30/20  1550     History Chief Complaint  Patient presents with  . legs jerking    Shawna [name] is a 80 y.o. female.  Patient's dialysis patient.  Normally dialyzed Monday Wednesdays and Fridays.  For end-stage renal disease.  Patient was dialyzed on Friday.  Due to be dialyzed again tomorrow.  Yesterday patient started with jerking motions in her arms and legs.  Which got much worse today.  Making it extremely difficult for her to ambulate.  Her legs just give out.  Denies any headache chest pain shortness of breath abdominal pain any numbness or weakness.  Patient gets dialysis through her AV fistula left upper extremity.        Past Medical History:  Diagnosis Date  . Acute on chronic respiratory failure with hypoxia ([place]) 10/10/2016  . Anxiety   . Arthritis   . AVM (arteriovenous malformation) of colon   . CAD (coronary artery disease)    a. s/p CABG in 2013 b. DES to D1 in 10/2016. c. cath in 07/2018 showing patent grafts with occlusion of D1 at prior stent site and progression of PDA disease --> medical management recommended  . Carotid artery disease ([place])    a. 63-78% LICA, 11/8848   . Chronic anemia   . Chronic bronchitis ([place])   . Chronic diastolic CHF (congestive heart failure) ([place])    a. 02/2012 Echo EF 60-65%, nl wall motion, Gr 1 DD, mod MR  . Colon cancer ([place]) 1992  . Esophageal stricture   . ESRD on hemodialysis ([place])    ESRD due to HTN, started dialysis 2011 and gets HD at [facility] with Dr Hinda Lenis on MWF schedule.  Access is LUA AVF as of Sept 2014.   Marland Kitchen GERD (gastroesophageal reflux disease)   . High cholesterol 12/2011  . History of blood transfusion 07/2011; 12/2011; 01/2012 X 2; 04/2012  . History of gout   . History of lower GI bleeding   . Hypertension   . Iron deficiency anemia   . Mitral regurgitation    a. Moderate by echo, 02/2012  . Myocardial infarction  ([place])   . NSVT (nonsustained ventricular tachycardia) ([place])   . Ovarian cancer ([place]) 1992  . PAF (paroxysmal atrial fibrillation) ([place])   . Pneumonia ~ 2009  . PUD (peptic ulcer disease)   . TIA (transient ischemic attack)     Patient Active Problem List   Diagnosis Date Noted  . Myoclonus 08/31/2020  . Clotted renal dialysis AV graft, initial encounter ([place])   . Hypertensive heart and chronic kidney disease with heart failure and stage 1 through stage 4 chronic kidney disease, or chronic kidney disease ([place])   . Hemodialysis-associated hypotension   . Acute hypoxemic respiratory failure ([place]) 06/14/2020  . Hypertension 06/12/2020  . Irritable bowel syndrome 02/25/2020  . Pulmonary edema 10/14/2019  . Adenomatous duodenal polyp 09/10/2019  . History of GI bleed 09/10/2019  . Angina pectoris ([place]) 06/05/2019  . Chest pain 06/03/2019  . Rectal bleeding 05/14/2019  . Small intestinal bacterial overgrowth 05/14/2019  . Iron deficiency anemia 04/02/2019  . GI bleed 03/08/2019  . Gastrointestinal hemorrhage with melena 03/06/2019  . Acute respiratory failure with hypoxia ([place]) 12/25/2018  . Elevated troponin 12/14/2018  . Chest pain at rest 07/13/2018  . Hand steal syndrome ([place]) 08/01/2017  . Macrocytic anemia 07/14/2017  . Coronary artery disease 06/05/2017  . Mesenteric ischemia (  [place])   . Diverticulitis   . Enteritis   . Complication of vascular access for dialysis 03/19/2017  . Preoperative clearance 01/25/2017  . Symptomatic anemia 10/24/2016  . H/O non-ST elevation myocardial infarction (NSTEMI) 10/24/2016  . Fluid overload 10/10/2016  . Complication from renal dialysis device 10/10/2016  . Non-ST elevation MI (NSTEMI) ([place])   . Encounter for fitting and adjustment of vascular catheter   . Heme positive stool   . Demand ischemia ([place]) 07/27/2016  . Hypertensive emergency 07/08/2016  . Acute on chronic respiratory failure with hypoxia ([place])   . Cardiac arrest ([place])    . Palliative care encounter   . Goals of care, counseling/discussion   . Hypertensive crisis without congestive heart failure 05/09/2016  . Acute pulmonary edema ([place]) 04/06/2016  . Acute respiratory failure ([place]) 04/06/2016  . Hypertensive crisis 01/27/2016  . History of colon cancer 01/27/2016  . History of ovarian cancer 01/27/2016  . Hypertensive urgency 01/27/2016  . Paroxysmal atrial fibrillation ([place]) 10/14/2015  . Coronary angioplasty status 10/14/2015  . Malignant neoplasm of right ovary ([place]) 10/14/2015  . Narrow complex tachycardia ([place]) 09/08/2015  . SVT (supraventricular tachycardia) ([place]) 09/08/2015  . Influenza A 08/30/2015  . Acute on chronic diastolic CHF (congestive heart failure) ([place]) 05/04/2015  . Unstable angina ([place]) 05/03/2015  . DOE (dyspnea on exertion)   . Essential hypertension   . Pain in joint, lower leg 08/14/2014  . Dacryocystitis 05/29/2013  . Chronic diastolic CHF (congestive heart failure) ([place]) 03/22/2013  . GI bleeding 03/21/2013  . Acute blood loss anemia 03/21/2013  . Vaginal odor 03/12/2013  . Vaginal discharge 03/12/2013  . Occlusion and stenosis of carotid artery without mention of cerebral infarction 01/24/2013  . Hx of CABG 07/05/2012  . Carotid artery disease ([place]) 07/05/2012  . Mitral regurgitation 06/12/2012  . Pneumonia 06/09/2012  . Non-STEMI (non-ST elevated myocardial infarction) ([place]) 06/08/2012  . Ischemic chest pain ([place]) 03/01/2012  . 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 GERD (gastroesophageal reflux disease) 01/09/2012  . HLD (hyperlipidemia) 01/05/2012  . Atherosclerotic heart disease of native coronary artery without angina pectoris 12/16/2011  . Essential hypertension, benign 12/16/2011  . ESRD on hemodialysis ([place]) 12/16/2011  . Anxiety disorder 05/04/2011  . Anemia in chronic kidney disease 04/29/2011  . Secondary hyperparathyroidism of renal origin ([place]) 04/29/2011  . ESRD (end  stage renal disease) on dialysis ([place]) 04/29/2011  . Gout 04/29/2011  . Hypertensive chronic kidney disease with stage 5 chronic kidney disease or end stage renal disease ([place]) 04/29/2011    Past Surgical History:  Procedure Laterality Date  . A/V SHUNTOGRAM Left 03/19/2019   Procedure: A/V SHUNTOGRAM;  Surgeon: Katha Cabal, MD;  Location: [place] CV LAB;  Service: Cardiovascular;  Laterality: Left;  . ABDOMINAL HYSTERECTOMY  1992  . APPENDECTOMY  06/1990  . AV FISTULA PLACEMENT  07/2009   left upper arm  . AV FISTULA PLACEMENT Right 09/06/2016   Procedure: RIGHT FOREARM ARTERIOVENOUS (AV) GRAFT;  Surgeon: Elam Dutch, MD;  [facility] OR;  Service: Vascular;  Laterality: Right;  . AV FISTULA PLACEMENT N/A 02/24/2017   Procedure: INSERTION OF ARTERIOVENOUS (AV) GORE-TEX GRAFT ARM (BRACHIAL AXILLARY);  Surgeon: Katha Cabal, MD;  Location: [facility] ORS;  Service: Vascular;  Laterality: N/A;  . [place] Right 09/06/2016   Procedure: REMOVAL OF Right Arm ARTERIOVENOUS GORETEX GRAFT and Vein Patch angioplasty of brachial artery;  Surgeon: Angelia Mould, MD;  Location: [place];  Service:  Vascular;  Laterality: Right;  . BIOPSY  09/26/2019   Procedure: BIOPSY;  Surgeon: Rogene Houston, MD;  [facility];  Service: Endoscopy;;  . COLON RESECTION  1992  . COLON SURGERY    . COLONOSCOPY N/A 03/09/2019   Procedure: COLONOSCOPY;  Surgeon: Rogene Houston, MD;  [facility];  Service: Endoscopy;  Laterality: N/A;  . CORONARY ANGIOPLASTY WITH STENT PLACEMENT  12/15/11   "2"  . CORONARY ANGIOPLASTY WITH STENT PLACEMENT  y/2013   "1; makes total of 3" (05/02/2012)  . CORONARY ARTERY BYPASS GRAFT  06/13/2012   Procedure: CORONARY ARTERY BYPASS GRAFTING (CABG);  Surgeon: Grace Isaac, MD;  Location: [place];  Service: Open Heart Surgery;  Laterality: N/A;  cabg x four;  using left internal mammary artery, and left leg greater saphenous vein harvested  endoscopically  . CORONARY STENT INTERVENTION N/A 10/13/2016   Procedure: Coronary Stent Intervention;  Surgeon: Troy Sine, MD;  Location: [place] CV LAB;  Service: Cardiovascular;  Laterality: N/A;  . DIALYSIS/PERMA CATHETER REMOVAL N/A 04/18/2017   Procedure: DIALYSIS/PERMA CATHETER REMOVAL;  Surgeon: Katha Cabal, MD;  Location: [place] CV LAB;  Service: Cardiovascular;  Laterality: N/A;  . DILATION AND CURETTAGE OF UTERUS    . ESOPHAGOGASTRODUODENOSCOPY  01/20/2012   Procedure: ESOPHAGOGASTRODUODENOSCOPY (EGD);  Surgeon: Ladene Artist, MD,FACG;  Location: [facility] ENDOSCOPY;  Service: Endoscopy;  Laterality: N/A;  . ESOPHAGOGASTRODUODENOSCOPY N/A 03/26/2013   Procedure: ESOPHAGOGASTRODUODENOSCOPY (EGD);  Surgeon: Irene Shipper, MD;  Location[facility] ENDOSCOPY;  Service: Endoscopy;  Laterality: N/A;  . ESOPHAGOGASTRODUODENOSCOPY N/A 04/30/2015   Procedure: ESOPHAGOGASTRODUODENOSCOPY (EGD);  Surgeon: Rogene Houston, MD;  [facility];  Service: Endoscopy;  Laterality: N/A;  1pm - moved to 10/20 @ 1:10  . ESOPHAGOGASTRODUODENOSCOPY N/A 07/29/2016   Procedure: ESOPHAGOGASTRODUODENOSCOPY (EGD);  Surgeon: Manus Gunning, MD;  Location: [place];  Service: Gastroenterology;  Laterality: N/A;  enteroscopy  . ESOPHAGOGASTRODUODENOSCOPY N/A 09/26/2019   Procedure: ESOPHAGOGASTRODUODENOSCOPY (EGD);  Surgeon: Rogene Houston, MD;  [facility];  Service: Endoscopy;  Laterality: N/A;  1250  . GIVENS CAPSULE STUDY N/A 03/07/2019   Procedure: GIVENS CAPSULE STUDY;  Surgeon: Rogene Houston, MD;  [facility];  Service: Endoscopy;  Laterality: N/A;  7:30  . INTRAOPERATIVE TRANSESOPHAGEAL ECHOCARDIOGRAM  06/13/2012   Procedure: INTRAOPERATIVE TRANSESOPHAGEAL ECHOCARDIOGRAM;  Surgeon: Grace Isaac, MD;  Location: [place];  Service: Open Heart Surgery;  Laterality: N/A;  . IR FLUORO GUIDE CV LINE RIGHT  06/17/2020  . IR GENERIC HISTORICAL  07/26/2016   IR  FLUORO GUIDE CV LINE RIGHT 07/26/2016 Greggory Keen, MD [facility]-INTER[facility]  . IR GENERIC HISTORICAL  07/26/2016   IR US GUIDE VASC ACCESS RIGHT 07/26/2016 Greggory Keen, MD [facility]  . IR GENERIC HISTORICAL  08/02/2016   IR US GUIDE VASC ACCESS RIGHT 08/02/2016 Greggory Keen, MD [facility]  . IR GENERIC HISTORICAL  08/02/2016   IR FLUORO GUIDE CV LINE RIGHT 08/02/2016 Greggory Keen, MD [facility]INTER[facility]  . IR RADIOLOGY PERIPHERAL GUIDED IV START  03/28/2017  . IR REMOVAL TUN CV CATH W/O FL  08/11/2020  . IR THROMBECTOMY AV FISTULA W/THROMBOLYSIS INC/SHUNT/IMG LEFT Left 06/17/2020  . IR US GUIDE VASC ACCESS LEFT  06/17/2020  . IR US GUIDE VASC ACCESS RIGHT  03/28/2017  . IR US GUIDE VASC ACCESS RIGHT  06/17/2020  . LEFT HEART CATH AND CORONARY ANGIOGRAPHY N/A 09/20/2016   Procedure: Left Heart Cath and Coronary  Angiography;  Surgeon: [name], MD;  Location: [place] CV LAB;  Service: Cardiovascular;  Laterality: N/A;  . LEFT HEART CATH AND CORS/GRAFTS ANGIOGRAPHY N/A 10/13/2016   Procedure: Left Heart Cath and Cors/Grafts Angiography;  Surgeon: Troy Sine, MD;  Location: [place] CV LAB;  Service: Cardiovascular;  Laterality: N/A;  . LEFT HEART CATH AND CORS/GRAFTS ANGIOGRAPHY N/A 07/13/2018   Procedure: LEFT HEART CATH AND CORS/GRAFTS ANGIOGRAPHY;  Surgeon: Martinique, Peter M, MD;  Location: [place] CV LAB;  Service: Cardiovascular;  Laterality: N/A;  . LEFT HEART CATHETERIZATION WITH CORONARY ANGIOGRAM N/A 12/15/2011   Procedure: LEFT HEART CATHETERIZATION WITH CORONARY ANGIOGRAM;  Surgeon: Burnell Blanks, MD;  Location: [facility] CATH LAB;  Service: Cardiovascular;  Laterality: N/A;  . LEFT HEART CATHETERIZATION WITH CORONARY ANGIOGRAM N/A 01/10/2012   Procedure: LEFT HEART CATHETERIZATION WITH CORONARY ANGIOGRAM;  Surgeon: Peter M Martinique, MD;  Location: [facility] CATH LAB;  Service: Cardiovascular;  Laterality: N/A;  . LEFT HEART CATHETERIZATION WITH CORONARY ANGIOGRAM N/A 06/08/2012   Procedure: LEFT  HEART CATHETERIZATION WITH CORONARY ANGIOGRAM;  Surgeon: Burnell Blanks, MD;  Location: [facility] CATH LAB;  Service: Cardiovascular;  Laterality: N/A;  . LEFT HEART CATHETERIZATION WITH CORONARY/GRAFT ANGIOGRAM N/A 12/10/2013   Procedure: LEFT HEART CATHETERIZATION WITH Beatrix Fetters;  Surgeon: [name], MD;  Location: [facility] CATH LAB;  Service: Cardiovascular;  Laterality: N/A;  . OVARY SURGERY     ovarian cancer  . POLYPECTOMY  03/09/2019   Procedure: POLYPECTOMY;  Surgeon: Rogene Houston, MD;  [facility];  Service: Endoscopy;;  cecal   . POLYPECTOMY N/A 09/26/2019   Procedure: DUODENAL POLYPECTOMY;  Surgeon: Rogene Houston, MD;  [facility];  Service: Endoscopy;  Laterality: N/A;  . REVISION OF ARTERIOVENOUS GORETEX GRAFT N/A 02/24/2017   Procedure: REVISION OF ARTERIOVENOUS GORETEX GRAFT (RESECTION);  Surgeon: Katha Cabal, MD;  Location: [facility] ORS;  Service: Vascular;  Laterality: N/A;  . REVISON OF ARTERIOVENOUS FISTULA Left 06/19/2020   Procedure: REVISION OF LEFT UPPER ARM AV GRAFT WITH INTERPOSITION JUMP GRAFT USING 6MM GORE LIMB;  Surgeon: Marty Heck, MD;  Location: [place];  Service: Vascular;  Laterality: Left;  . SHUNTOGRAM N/A 10/15/2013   Procedure: Fistulogram;  Surgeon: Serafina Mitchell, MD;  Location: [facility] CATH LAB;  Service: Cardiovascular;  Laterality: N/A;  . THROMBECTOMY / ARTERIOVENOUS GRAFT REVISION  2011   left upper arm  . TUBAL LIGATION  1980's  . UPPER EXTREMITY ANGIOGRAPHY Bilateral 12/06/2016   Procedure: Upper Extremity Angiography;  Surgeon: Katha Cabal, MD;  Location: [place] CV LAB;  Service: Cardiovascular;  Laterality: Bilateral;  . UPPER EXTREMITY INTERVENTION Left 06/06/2017   Procedure: UPPER EXTREMITY INTERVENTION;  Surgeon: Katha Cabal, MD;  Location: [place] CV LAB;  Service: Cardiovascular;  Laterality: Left;     OB History    Gravida  2   Para  2   Term      Preterm   2   AB      Living  2     SAB      IAB      Ectopic      Multiple      Live Births              Family History  Problem Relation Age of Onset  . Heart disease Mother        Heart Disease before age 18  . Hyperlipidemia Mother   .  Hypertension Mother   . Diabetes Mother   . Heart attack Mother   . Heart disease Father        Heart Disease before age 70  . Hyperlipidemia Father   . Hypertension Father   . Diabetes Father   . Diabetes Sister   . Hypertension Sister   . Diabetes Brother   . Hyperlipidemia Brother   . Heart attack Brother   . Hypertension Sister   . Heart attack Brother   . Colon cancer Child 6  . Other Other        noncontributory for early CAD  . Esophageal cancer Neg Hx   . Liver disease Neg Hx   . Kidney disease Neg Hx   . Colon polyps Neg Hx     Social History   Tobacco Use  . Smoking status: Never Smoker  . Smokeless tobacco: Never Used  Vaping Use  . Vaping Use: Never used  Substance Use Topics  . Alcohol use: No    Alcohol/week: 0.0 standard drinks  . Drug use: No    Home Medications Prior to Admission medications   Medication Sig Start Date End Date Taking? Authorizing Provider  albuterol (VENTOLIN HFA) 108 (90 Base) MCG/ACT inhaler Inhale 2 puffs into the lungs every 6 (six) hours as needed for wheezing or shortness of breath.   Yes [provider]  amiodarone (PACERONE) 200 MG tablet Take 1 tablet (200 mg total) by mouth daily. Starting July 2 12/17/19  Yes Strader, Fransisco Hertz, PA-C  aspirin EC 81 MG tablet Take 81 mg by mouth daily.  09/27/19  Yes Rehman, Mechele Dawley, MD  dicyclomine (BENTYL) 10 MG capsule Take 10 mg by mouth 2 (two) times daily.   Yes [provider]  fluconazole (DIFLUCAN) 100 MG tablet Take 100 mg by mouth daily. 08/17/20  Yes [provider]  fluticasone (FLONASE) 50 MCG/ACT nasal spray Place 1 spray into both nostrils at bedtime as needed for allergies.    Yes [provider]  gabapentin (NEURONTIN) 100 MG capsule Take 100 mg by mouth daily.  04/16/20  Yes [provider]  isosorbide mononitrate (IMDUR) 60 MG 24 hr tablet Take 1 tablet (60 mg total) by mouth 2 (two) times daily. 08/10/20 11/08/20 Yes BranchAlphonse Guild, MD  lidocaine-prilocaine (EMLA) cream Apply 1 application topically every Monday, Wednesday, and Friday. Prior to dialysis   Yes [provider]  loratadine (CLARITIN) 10 MG tablet Take 1 tablet (10 mg total) by mouth daily as needed for allergies. 07/14/18  Yes Hosie Poisson, MD  multivitamin (RENA-VIT) TABS tablet Take 1 tablet by mouth daily.   Yes [provider]  nitroGLYCERIN (NITROSTAT) 0.4 MG SL tablet Place 1 tablet (0.4 mg total) under the tongue every 5 (five) minutes as needed for chest pain. 05/07/20  Yes Johnson, Clanford L, MD  omeprazole (PRILOSEC) 40 MG capsule Take 1 capsule (40 mg total) by mouth daily before breakfast. 03/31/20  Yes Laurine Blazer B, PA-C  ranolazine (RANEXA) 500 MG 12 hr tablet Take 1 tablet (500 mg total) by mouth 2 (two) times daily. 08/20/20  Yes Verta Ellen., NP  sevelamer carbonate (RENVELA) 800 MG tablet Take 2 tablets (1,600 mg total) by mouth 3 (three) times daily with meals. 06/20/20  Yes Meccariello, Bernita Raisin, DO  simvastatin (ZOCOR) 20 MG tablet Take 1 tablet (20 mg total) by mouth at bedtime. 07/07/20  Yes Verta Ellen., NP  cyclobenzaprine (FLEXERIL) 5 MG tablet  Take 5 mg by mouth 3 (three) times daily as needed. Patient not taking: Reported on 08/30/2020 06/22/20   [provider]  ketoconazole (NIZORAL) 2 % cream Apply 1 application topically 3 (three) times daily. Patient not taking: No sig reported 08/17/20   [provider]    Allergies    Aspirin, Penicillins, Amlodipine, Bactrim [sulfamethoxazole-trimethoprim], Contrast media [iodinated diagnostic agents], Iron, Nitrofurantoin, Tylenol [acetaminophen], Gabapentin, Hydralazine, Dexilant  [dexlansoprazole], Levaquin [levofloxacin in d5w], Morphine and related, Plavix [clopidogrel bisulfate], Protonix [pantoprazole sodium], and Venofer [ferric oxide]  Review of Systems   Review of Systems  Constitutional: Negative for chills and fever.  HENT: Negative for rhinorrhea and sore throat.   Eyes: Negative for visual disturbance.  Respiratory: Negative for cough and shortness of breath.   Cardiovascular: Negative for chest pain and leg swelling.  Gastrointestinal: Negative for abdominal pain, diarrhea, nausea and vomiting.  Genitourinary: Negative for dysuria.  Musculoskeletal: Negative for back pain and neck pain.  Skin: Negative for rash.  Neurological: Positive for tremors and weakness. Negative for dizziness, speech difficulty, light-headedness and headaches.  Hematological: Bruises/bleeds easily.  Psychiatric/Behavioral: Negative for confusion.    Physical Exam Updated Vital Signs BP (!) 187/94   Pulse 81   Temp 98.3 F (36.8 C) (Oral)   Resp 19   Ht 1.549 m (5\' 1" )   Wt 63.5 kg   SpO2 94%   BMI 26.45 kg/m   Physical Exam Vitals and nursing note reviewed.  Constitutional:      General: She is not in acute distress.    Appearance: Normal appearance. She is well-developed and well-nourished.  HENT:     Head: Normocephalic and atraumatic.  Eyes:     Extraocular Movements: Extraocular movements intact.     Conjunctiva/sclera: Conjunctivae normal.     Pupils: Pupils are equal, round, and reactive to light.  Cardiovascular:     Rate and Rhythm: Normal rate and regular rhythm.     Heart sounds: No murmur heard.   Pulmonary:     Effort: Pulmonary effort is normal. No respiratory distress.     Breath sounds: Normal breath sounds.  Abdominal:     Palpations: Abdomen is soft.     Tenderness: There is no abdominal tenderness.  Musculoskeletal:        General: No edema.     Cervical back: Neck supple.     Comments: AV fistula left upper extremity with good  thrill.  Skin:    General: Skin is warm and dry.     Capillary Refill: Capillary refill takes less than 2 seconds.  Neurological:     Mental Status: She is alert and oriented to person, place, and time.     Cranial Nerves: No cranial nerve deficit.     Motor: No weakness.     Coordination: Coordination abnormal.     Comments: Patient with good strength to arms and legs.  But when she elevates them there is a jerking motion.  Able to hold them up same with arms.  Some discoordination due to the jerking motion.  Not really a tremor but more jerking.  Psychiatric:        Mood and Affect: Mood and affect normal.     ED Results / Procedures / Treatments   Labs (all labs ordered are listed, but only abnormal results are displayed) Labs Reviewed  CBC WITH DIFFERENTIAL/PLATELET - Abnormal; Notable for the following components:      Result Value   RBC 2.88 (*)  Hemoglobin 10.5 (*)    HCT 34.7 (*)    MCV 120.5 (*)    MCH 36.5 (*)    RDW 17.4 (*)    All other components within normal limits  BASIC METABOLIC PANEL - Abnormal; Notable for the following components:   Potassium 5.2 (*)    Chloride 96 (*)    BUN 49 (*)    Creatinine, Ser 8.68 (*)    GFR, Estimated 4 (*)    All other components within normal limits  HEPATIC FUNCTION PANEL - Abnormal; Notable for the following components:   AST 8 (*)    All other components within normal limits  SARS CORONAVIRUS 2 (TAT 6-24 HRS)  MAGNESIUM    EKG EKG Interpretation  Date/Time:  Sunday August 30 2020 22:34:09 EST Ventricular Rate:  75 PR Interval:    QRS Duration: 151 QT Interval:  464 QTC Calculation: 519 R Axis:   1 Text Interpretation: Sinus rhythm Left bundle branch block Confirmed by Fredia Sorrow [phone]) on 08/30/2020 11:26:11 PM   Radiology CT Head Wo Contrast  Result Date: 08/30/2020 CLINICAL DATA:  Neuro deficit, stroke suspected, jerking tremor with spasm since yesterday, fall today head strike on carpeted  flooring EXAM: CT HEAD WITHOUT CONTRAST TECHNIQUE: Contiguous axial images were obtained from the base of the skull through the vertex without intravenous contrast. COMPARISON:  CT 01/31/2018 FINDINGS: Brain: No evidence of acute infarction, hemorrhage, hydrocephalus, extra-axial collection, visible mass lesion or mass effect. Symmetric prominence of the ventricles, cisterns and sulci compatible with parenchymal volume loss. Patchy areas of white matter hypoattenuation are most compatible with chronic microvascular angiopathy. Vascular: Atherosclerotic calcification of the carotid siphons and intradural vertebral arteries. No hyperdense vessel. Skull: No significant scalp swelling or large hematoma. No calvarial fracture or acute osseous abnormalities. Sinuses/Orbits: Paranasal sinuses and mastoid air cells are predominantly clear. Included orbital structures are unremarkable. Other: Debris in the left external auditory canal. IMPRESSION: 1. No acute intracranial abnormality. If there is persisting concern for acute infarction, MRI is more sensitive and specific for early features of ischemia. 2. Chronic microvascular angiopathy and parenchymal volume loss. Intracranial atherosclerosis. 3. Debris in the left external auditory canal, correlate for cerumen impaction. Electronically Signed   By: Lovena Le M.D.   On: 08/30/2020 22:57   DG Chest Port 1 View  Result Date: 08/30/2020 CLINICAL DATA:  Bilateral leg jerking and spasm since yesterday, fall today EXAM: PORTABLE CHEST 1 VIEW COMPARISON:  Radiograph 08/10/2020 FINDINGS: Some chronically coarsened interstitial and bronchitic changes similar to prior studies with some additional bandlike regions of scarring and or atelectasis in both lung bases. Stable prominent cardiomediastinal silhouette with postsurgical changes from prior sternotomy and CABG as well as coronary stenting. The aorta is calcified. The remaining cardiomediastinal contours are unremarkable.  No acute osseous or soft tissue abnormality. Degenerative changes are present in the imaged spine and shoulders. Telemetry leads overlie the chest. Vascular stenting in the left axilla. IMPRESSION: 1. No acute cardiopulmonary abnormality. Chronic findings as described above. 2. Stable postoperative changes from prior sternotomy, CABG and coronary stenting. Electronically Signed   By: Lovena Le M.D.   On: 08/30/2020 23:03    Procedures Procedures      Medications Ordered in ED Medications - No data to display  ED Course  I have reviewed the triage vital signs and the nursing notes.  Pertinent labs & imaging results that were available during my care of the patient were reviewed by me and considered in my  medical decision making (see chart for details).    MDM Rules/Calculators/A&P                          Work-up here without any acute findings.  Chest x-ray without volume overload.  Head CT without acute changes.  No leukocytosis.  Hemoglobin 10.5.  Metabolic panel without any acute changes consistent with somebody with renal dialysis.  EKG with persistent bundle branch pattern.   Patient on Neurontin may be could be due to this.  The only new medication is Ranexa does not seem to list anything like this.  Not sure why she is having the jerking motions.  Discussed with hospitalist.  Plan from my standpoint was neurology consult in the morning.  May be MRI brain.   Final Clinical Impression(s) / ED Diagnoses Final diagnoses:  Tremor  Jerking movements of extremities  ESRD on dialysis [facility])    Rx / DC Orders ED Discharge Orders    None       Fredia Sorrow, MD 08/31/20 0110

## 2020-08-30 NOTE — ED Notes (Signed)
Patient transported to CT 

## 2020-08-30 NOTE — ED Triage Notes (Signed)
Pt c/o bilateral legs jerking and spasms since yesterday.  Pt states she fell today.  Pt states she her head on carpeted floor.  Denies any new pain since fall.

## 2020-08-31 DIAGNOSIS — N186 End stage renal disease: Secondary | ICD-10-CM

## 2020-08-31 DIAGNOSIS — G253 Myoclonus: Secondary | ICD-10-CM | POA: Diagnosis present

## 2020-08-31 DIAGNOSIS — Z992 Dependence on renal dialysis: Secondary | ICD-10-CM

## 2020-08-31 DIAGNOSIS — R252 Cramp and spasm: Secondary | ICD-10-CM

## 2020-08-31 LAB — COMPREHENSIVE METABOLIC PANEL
ALT: 11 U/L (ref 0–44)
AST: 8 U/L — ABNORMAL LOW (ref 15–41)
Albumin: 3.3 g/dL — ABNORMAL LOW (ref 3.5–5.0)
Alkaline Phosphatase: 85 U/L (ref 38–126)
Anion gap: 16 — ABNORMAL HIGH (ref 5–15)
BUN: 54 mg/dL — ABNORMAL HIGH (ref 8–23)
CO2: 25 mmol/L (ref 22–32)
Calcium: 10 mg/dL (ref 8.9–10.3)
Chloride: 97 mmol/L — ABNORMAL LOW (ref 98–111)
Creatinine, Ser: 9.83 mg/dL — ABNORMAL HIGH (ref 0.44–1.00)
GFR, Estimated: 4 mL/min — ABNORMAL LOW (ref >60)
Glucose, Bld: 76 mg/dL (ref 70–99)
Potassium: 5.3 mmol/L — ABNORMAL HIGH (ref 3.5–5.1)
Sodium: 138 mmol/L (ref 135–145)
Total Bilirubin: 0.7 mg/dL (ref 0.3–1.2)
Total Protein: 6.3 g/dL — ABNORMAL LOW (ref 6.5–8.1)

## 2020-08-31 LAB — CBC WITH DIFFERENTIAL/PLATELET
Abs Immature Granulocytes: 0.01 10*3/uL (ref 0.00–0.07)
Basophils Absolute: 0.1 10*3/uL (ref 0.0–0.1)
Basophils Relative: 2 %
Eosinophils Absolute: 0.4 10*3/uL (ref 0.0–0.5)
Eosinophils Relative: 6 %
HCT: 32.2 % — ABNORMAL LOW (ref 36.0–46.0)
Hemoglobin: 9.9 g/dL — ABNORMAL LOW (ref 12.0–15.0)
Immature Granulocytes: 0 %
Lymphocytes Relative: 13 %
Lymphs Abs: 0.8 10*3/uL (ref 0.7–4.0)
MCH: 36.8 pg — ABNORMAL HIGH (ref 26.0–34.0)
MCHC: 30.7 g/dL (ref 30.0–36.0)
MCV: 119.7 fL — ABNORMAL HIGH (ref 80.0–100.0)
Monocytes Absolute: 0.5 10*3/uL (ref 0.1–1.0)
Monocytes Relative: 9 %
Neutro Abs: 4 10*3/uL (ref 1.7–7.7)
Neutrophils Relative %: 70 %
Platelets: 191 10*3/uL (ref 150–400)
RBC: 2.69 MIL/uL — ABNORMAL LOW (ref 3.87–5.11)
RDW: 17.6 % — ABNORMAL HIGH (ref 11.5–15.5)
WBC: 5.8 10*3/uL (ref 4.0–10.5)
nRBC: 0 % (ref 0.0–0.2)

## 2020-08-31 LAB — TSH: TSH: 7.243 u[IU]/mL — ABNORMAL HIGH (ref 0.350–4.500)

## 2020-08-31 LAB — MAGNESIUM: Magnesium: 1.8 mg/dL (ref 1.7–2.4)

## 2020-08-31 LAB — VITAMIN B12: Vitamin B-12: 263 pg/mL (ref 180–914)

## 2020-08-31 LAB — VITAMIN D 25 HYDROXY (VIT D DEFICIENCY, FRACTURES): Vit D, 25-Hydroxy: 62.85 ng/mL (ref 30–100)

## 2020-08-31 LAB — SARS CORONAVIRUS 2 (TAT 6-24 HRS): SARS Coronavirus 2: NEGATIVE

## 2020-08-31 LAB — PHOSPHORUS: Phosphorus: 6 mg/dL — ABNORMAL HIGH (ref 2.5–4.6)

## 2020-08-31 MED ORDER — LORATADINE 10 MG PO TABS: 10.0000 mg | ORAL_TABLET | Freq: Every day | ORAL | Status: DC | PRN

## 2020-08-31 MED ORDER — ISOSORBIDE MONONITRATE ER 60 MG PO TB24
60.0000 mg | ORAL_TABLET | Freq: Two times a day (BID) | ORAL | Status: DC
  Administered 2020-08-31 – 2020-09-02 (×4): 60 mg via ORAL
  Filled 2020-08-31 (×5): qty 1

## 2020-08-31 MED ORDER — POLYETHYLENE GLYCOL 3350 17 G PO PACK: 17.0000 g | PACK | Freq: Every day | ORAL | Status: DC | PRN

## 2020-08-31 MED ORDER — SEVELAMER CARBONATE 800 MG PO TABS
1600.0000 mg | ORAL_TABLET | Freq: Three times a day (TID) | ORAL | Status: DC
  Administered 2020-08-31 – 2020-09-02 (×6): 1600 mg via ORAL
  Filled 2020-08-31 (×7): qty 2

## 2020-08-31 MED ORDER — PENTAFLUOROPROP-TETRAFLUOROETH EX AERO: 1.0000 "application " | INHALATION_SPRAY | CUTANEOUS | Status: DC | PRN

## 2020-08-31 MED ORDER — CHLORHEXIDINE GLUCONATE CLOTH 2 % EX PADS
6.0000 | MEDICATED_PAD | Freq: Every day | CUTANEOUS | Status: DC
  Administered 2020-09-01 – 2020-09-02 (×2): 6 via TOPICAL

## 2020-08-31 MED ORDER — LIDOCAINE-PRILOCAINE 2.5-2.5 % EX CREA: 1.0000 "application " | TOPICAL_CREAM | CUTANEOUS | Status: DC | PRN

## 2020-08-31 MED ORDER — HEPARIN SODIUM (PORCINE) 1000 UNIT/ML DIALYSIS: 1000.0000 [IU] | INTRAMUSCULAR | Status: DC | PRN

## 2020-08-31 MED ORDER — SIMVASTATIN 20 MG PO TABS
20.0000 mg | ORAL_TABLET | Freq: Every day | ORAL | Status: DC
  Administered 2020-09-01 (×2): 20 mg via ORAL
  Filled 2020-08-31 (×2): qty 1

## 2020-08-31 MED ORDER — FLUTICASONE PROPIONATE 50 MCG/ACT NA SUSP: 1.0000 | Freq: Every evening | NASAL | Status: DC | PRN

## 2020-08-31 MED ORDER — ACETAMINOPHEN 325 MG PO TABS: 650.0000 mg | ORAL_TABLET | Freq: Four times a day (QID) | ORAL | Status: DC | PRN

## 2020-08-31 MED ORDER — ONDANSETRON HCL 4 MG PO TABS: 4.0000 mg | ORAL_TABLET | Freq: Four times a day (QID) | ORAL | Status: DC | PRN

## 2020-08-31 MED ORDER — HEPARIN SODIUM (PORCINE) 5000 UNIT/ML IJ SOLN
5000.0000 [IU] | Freq: Three times a day (TID) | INTRAMUSCULAR | Status: DC
  Administered 2020-08-31 – 2020-09-01 (×5): 5000 [IU] via SUBCUTANEOUS
  Filled 2020-08-31 (×6): qty 1

## 2020-08-31 MED ORDER — BENZTROPINE MESYLATE 1 MG/ML IJ SOLN
1.0000 mg | Freq: Once | INTRAMUSCULAR | Status: AC
  Administered 2020-08-31: 1 mg via INTRAMUSCULAR
  Filled 2020-08-31: qty 2
  Filled 2020-08-31: qty 1

## 2020-08-31 MED ORDER — ALBUTEROL SULFATE HFA 108 (90 BASE) MCG/ACT IN AERS: 2.0000 | INHALATION_SPRAY | Freq: Four times a day (QID) | RESPIRATORY_TRACT | Status: DC | PRN

## 2020-08-31 MED ORDER — ACETAMINOPHEN 650 MG RE SUPP: 650.0000 mg | Freq: Four times a day (QID) | RECTAL | Status: DC | PRN

## 2020-08-31 MED ORDER — SODIUM CHLORIDE 0.9 % IV SOLN: 100.0000 mL | INTRAVENOUS | Status: DC | PRN

## 2020-08-31 MED ORDER — CYCLOBENZAPRINE HCL 10 MG PO TABS: 5.0000 mg | ORAL_TABLET | Freq: Three times a day (TID) | ORAL | Status: DC | PRN

## 2020-08-31 MED ORDER — ASPIRIN EC 81 MG PO TBEC
81.0000 mg | DELAYED_RELEASE_TABLET | Freq: Every day | ORAL | Status: DC
  Administered 2020-08-31 – 2020-09-02 (×3): 81 mg via ORAL
  Filled 2020-08-31 (×3): qty 1

## 2020-08-31 MED ORDER — AMIODARONE HCL 200 MG PO TABS
200.0000 mg | ORAL_TABLET | Freq: Every day | ORAL | Status: DC
  Administered 2020-08-31 – 2020-09-02 (×3): 200 mg via ORAL
  Filled 2020-08-31 (×3): qty 1

## 2020-08-31 MED ORDER — ONDANSETRON HCL 4 MG/2ML IJ SOLN: 4.0000 mg | Freq: Four times a day (QID) | INTRAMUSCULAR | Status: DC | PRN

## 2020-08-31 NOTE — Progress Notes (Signed)
Patient seen and examined.  Admitted after midnight secondary to myoclonic movements and weakness.  Patient reports mechanical fall at home with difficulty standing after that.  Blood work in the ED demonstrating potassium 3.2, bicarb 25, BUN 29 admitting to full hemodialysis today (patient is ESRD M-W-F).  Patient denies chest pain, nausea, vomiting or shortness of breath.  After discussing with nephrology service decision has been made to hold gabapentin and Ranexa.  Proceed with hemodialysis treatment today and plan physical therapy evaluation/recommendations.  Please refer to H&P written by Dr. Clearence Ped for further info and details on admission.  Barton Dubois MD [phone]

## 2020-08-31 NOTE — H&P (Signed)
TRH H&P    Patient Demographics:    Shawna [name], is a 80 y.o. female  MRN: [id number]  DOB - February 05, 1941  Admit Date - 08/30/2020  Referring MD/NP/PA: Rogene Houston  Outpatient Primary MD for the patient is [facility]  Patient coming from: Home  Chief complaint- Jerking   HPI:    Shawna [name]  is a 80 y.o. female, with history of TIA, paroxysmal atrial fibrillation, ovarian cancer, myocardial infarction, hypertension, lower GI bleed, GERD, ESRD on hemodialysis last dialyzed 08/28/2020 due for dialysis today, diastolic CHF, and more presents to the ED with a chief complaint of jerking.  Patient reports that she has had uncontrolled jerking of her whole body that feels like spasms.  It started on 2/20 morning when she woke up.  She reports that the jerking caused her to fall down because she could not control her lower extremities.  Her husband reports that she was too weak to stand back up after she fell down.  She reports that the jerking is not painful.  There is no aura or warning symptoms that is about to happen.  After she reports it is never happened before, but then she reports that months ago she had a minor version of this happened.  She reports she has never had anything like this bad.  She reports that the myoclonic activity was present for the entire day.  She reports it is constant, but in the room its intermittent.  She reports she has had no fevers.  She has had no headache, change in vision, change in hearing associated with these myoclonic activities.  She reports her only new medication is Ranexa.  She is on gabapentin, but has had no dose change, or change to the way she takes it in months.  She has no other complaints at this time. *The only thing patient did that was out of her norm, the night before the myoclonic activity started, was get a pedicure.   On exam-she does seem distractible.   When asking her to demonstrate handgrip, use bicep strength to pull close, use triceps strength appreciated way, she does not have any myoclonic activity.  However when talking to her she has obvious myoclonic activity and even includes her facial muscles causing her to lip smacking and make other abnormal sounds with her mouth.  Patient is vaccinated for COVID, she does not smoke, she does not drink alcohol, she is full code.  In the ED Temp 98.3, heart rate 67-81, respiratory rate 14-19, blood pressure 187/94 White blood cell count 5.9, hemoglobin 10.5 Chemistry panel reveals a hyperkalemia with potassium of 5.2, creatinine of 8.68 -patient due for dialysis today CT head shows no intracranial abnormality Chest x-ray shows no acute cardiopulmonary abnormality Admission requested for further work-up of myoclonic activity    Review of systems:    In addition to the HPI above,  No Fever-chills, No Headache, No changes with Vision or hearing, No problems swallowing food or Liquids, No Chest pain, Cough or Shortness of Breath, No Abdominal pain, No  Nausea or Vomiting, bowel movements are regular, No Blood in stool or Urine, No dysuria, No new skin rashes or bruises, No new joints pains-aches,  No new weakness, tingling, numbness in any extremity, No recent weight gain or loss, No polyuria, polydypsia or polyphagia, No significant Mental Stressors.  All other systems reviewed and are negative.    Past History of the following :    Past Medical History:  Diagnosis Date  . Acute on chronic respiratory failure with hypoxia ([place]) 10/10/2016  . Anxiety   . Arthritis   . AVM (arteriovenous malformation) of colon   . CAD (coronary artery disease)    a. s/p CABG in 2013 b. DES to D1 in 10/2016. c. cath in 07/2018 showing patent grafts with occlusion of D1 at prior stent site and progression of PDA disease --> medical management recommended  . Carotid artery disease ([place])    a. 40-81%  LICA, 10/4816   . Chronic anemia   . Chronic bronchitis ([place])   . Chronic diastolic CHF (congestive heart failure) ([place])    a. 02/2012 Echo EF 60-65%, nl wall motion, Gr 1 DD, mod MR  . Colon cancer ([place]) 1992  . Esophageal stricture   . ESRD on hemodialysis ([place])    ESRD due to HTN, started dialysis 2011 and gets HD at [facility] with Dr Hinda Lenis on MWF schedule.  Access is LUA AVF as of Sept 2014.   Marland Kitchen GERD (gastroesophageal reflux disease)   . High cholesterol 12/2011  . History of blood transfusion 07/2011; 12/2011; 01/2012 X 2; 04/2012  . History of gout   . History of lower GI bleeding   . Hypertension   . Iron deficiency anemia   . Mitral regurgitation    a. Moderate by echo, 02/2012  . Myocardial infarction ([employer])   . NSVT (nonsustained ventricular tachycardia) ([place])   . Ovarian cancer ([place]) 1992  . PAF (paroxysmal atrial fibrillation) ([place])   . Pneumonia ~ 2009  . PUD (peptic ulcer disease)   . TIA (transient ischemic attack)       Past Surgical History:  Procedure Laterality Date  . A/V SHUNTOGRAM Left 03/19/2019   Procedure: A/V SHUNTOGRAM;  Surgeon: Katha Cabal, MD;  Location: [place] [facility];  Service: Cardiovascular;  Laterality: Left;  . ABDOMINAL HYSTERECTOMY  1992  . APPENDECTOMY  06/1990  . AV FISTULA PLACEMENT  07/2009   left upper arm  . AV FISTULA PLACEMENT Right 09/06/2016   Procedure: RIGHT FOREARM ARTERIOVENOUS (AV) GRAFT;  Surgeon: Elam Dutch, MD;  Location: [facility] OR;  Service: Vascular;  Laterality: Right;  . AV FISTULA PLACEMENT N/A 02/24/2017   Procedure: INSERTION OF ARTERIOVENOUS (AV) GORE-TEX GRAFT ARM (BRACHIAL AXILLARY);  Surgeon: Katha Cabal, MD;  Location: [facility] ORS;  Service: Vascular;  Laterality: N/A;  . [place] Right 09/06/2016   Procedure: REMOVAL OF Right Arm ARTERIOVENOUS GORETEX GRAFT and Vein Patch angioplasty of brachial artery;  Surgeon: Angelia Mould, MD;  Location: [place];  Service: Vascular;  Laterality:  Right;  . BIOPSY  09/26/2019   Procedure: BIOPSY;  Surgeon: Rogene Houston, MD;  [facility];  Service: Endoscopy;;  . COLON RESECTION  1992  . COLON SURGERY    . COLONOSCOPY N/A 03/09/2019   Procedure: COLONOSCOPY;  Surgeon: Rogene Houston, MD;  [facility];  Service: Endoscopy;  Laterality: N/A;  . CORONARY ANGIOPLASTY WITH STENT PLACEMENT  12/15/11   "2"  . CORONARY ANGIOPLASTY  WITH STENT PLACEMENT  y/2013   "1; makes total of 3" (05/02/2012)  . CORONARY ARTERY BYPASS GRAFT  06/13/2012   Procedure: CORONARY ARTERY BYPASS GRAFTING (CABG);  Surgeon: Grace Isaac, MD;  Location: [place];  Service: Open Heart Surgery;  Laterality: N/A;  cabg x four;  using left internal mammary artery, and left leg greater saphenous vein harvested endoscopically  . CORONARY STENT INTERVENTION N/A 10/13/2016   Procedure: Coronary Stent Intervention;  Surgeon: Troy Sine, MD;  Location: [place] [facility];  Service: Cardiovascular;  Laterality: N/A;  . DIALYSIS/PERMA CATHETER REMOVAL N/A 04/18/2017   Procedure: DIALYSIS/PERMA CATHETER REMOVAL;  Surgeon: Katha Cabal, MD;  Location: [place] [facility];  Service: Cardiovascular;  Laterality: N/A;  . DILATION AND CURETTAGE OF UTERUS    . ESOPHAGOGASTRODUODENOSCOPY  01/20/2012   Procedure: ESOPHAGOGASTRODUODENOSCOPY (EGD);  Surgeon: Ladene Artist, MD,FACG;  Location: [facility] ENDOSCOPY;  Service: Endoscopy;  Laterality: N/A;  . ESOPHAGOGASTRODUODENOSCOPY N/A 03/26/2013   Procedure: ESOPHAGOGASTRODUODENOSCOPY (EGD);  Surgeon: Irene Shipper, MD;  Location: [facility] ENDOSCOPY;  Service: Endoscopy;  Laterality: N/A;  . ESOPHAGOGASTRODUODENOSCOPY N/A 04/30/2015   Procedure: ESOPHAGOGASTRODUODENOSCOPY (EGD);  Surgeon: Rogene Houston, MD;  [facility];  Service: Endoscopy;  Laterality: N/A;  1pm - moved to 10/20 @ 1:10  . ESOPHAGOGASTRODUODENOSCOPY N/A 07/29/2016   Procedure: ESOPHAGOGASTRODUODENOSCOPY (EGD);  Surgeon: Manus Gunning, MD;  Location: [place];  Service: Gastroenterology;  Laterality: N/A;  enteroscopy  . ESOPHAGOGASTRODUODENOSCOPY N/A 09/26/2019   Procedure: ESOPHAGOGASTRODUODENOSCOPY (EGD);  Surgeon: Rogene Houston, MD;  [facility];  Service: Endoscopy;  Laterality: N/A;  1250  . GIVENS CAPSULE STUDY N/A 03/07/2019   Procedure: GIVENS CAPSULE STUDY;  Surgeon: Rogene Houston, MD;  [facility];  Service: Endoscopy;  Laterality: N/A;  7:30  . INTRAOPERATIVE TRANSESOPHAGEAL ECHOCARDIOGRAM  06/13/2012   Procedure: INTRAOPERATIVE TRANSESOPHAGEAL ECHOCARDIOGRAM;  Surgeon: Grace Isaac, MD;  Location: [place];  Service: Open Heart Surgery;  Laterality: N/A;  . IR FLUORO GUIDE CV LINE RIGHT  06/17/2020  . IR GENERIC HISTORICAL  07/26/2016   IR FLUORO GUIDE CV LINE RIGHT 07/26/2016 Greggory Keen, MD [facility]  . IR GENERIC HISTORICAL  07/26/2016   IR US GUIDE VASC ACCESS RIGHT 07/26/2016 Greggory Keen, MD [facility]  . IR GENERIC HISTORICAL  08/02/2016   IR US GUIDE VASC ACCESS RIGHT 08/02/2016 Greggory Keen, MD [facility]  . IR GENERIC HISTORICAL  08/02/2016   IR FLUORO GUIDE CV LINE RIGHT 08/02/2016 Greggory Keen, MD [facility]  . IR RADIOLOGY PERIPHERAL GUIDED IV START  03/28/2017  . IR REMOVAL TUN CV CATH W/O FL  08/11/2020  . IR THROMBECTOMY AV FISTULA W/THROMBOLYSIS INC/SHUNT/IMG LEFT Left 06/17/2020  . IR US GUIDE VASC ACCESS LEFT  06/17/2020  . IR US GUIDE VASC ACCESS RIGHT  03/28/2017  . IR US GUIDE VASC ACCESS RIGHT  06/17/2020  . LEFT HEART CATH AND CORONARY ANGIOGRAPHY N/A 09/20/2016   Procedure: Left Heart Cath and Coronary Angiography;  Surgeon: [name], MD;  Location: [place] [facility];  Service: Cardiovascular;  Laterality: N/A;  . LEFT HEART CATH AND CORS/GRAFTS ANGIOGRAPHY N/A 10/13/2016   Procedure: Left Heart Cath and Cors/Grafts Angiography;  Surgeon: Troy Sine, MD;  Location: [place] [facility];  Service: Cardiovascular;  Laterality: N/A;  .  LEFT HEART CATH AND CORS/GRAFTS ANGIOGRAPHY N/A 07/13/2018   Procedure: LEFT HEART CATH AND CORS/GRAFTS ANGIOGRAPHY;  Surgeon: Martinique, Peter M, MD;  Location: [place] [facility];  Service: Cardiovascular;  Laterality: N/A;  . LEFT HEART CATHETERIZATION WITH CORONARY ANGIOGRAM N/A 12/15/2011   Procedure: LEFT HEART CATHETERIZATION WITH CORONARY ANGIOGRAM;  Surgeon: Burnell Blanks, MD;  Location[facility] CATH LAB;  Service: Cardiovascular;  Laterality: N/A;  . LEFT HEART CATHETERIZATION WITH CORONARY ANGIOGRAM N/A 01/10/2012   Procedure: LEFT HEART CATHETERIZATION WITH CORONARY ANGIOGRAM;  Surgeon: Peter M Martinique, MD;  Location: [facility] CATH LAB;  Service: Cardiovascular;  Laterality: N/A;  . LEFT HEART CATHETERIZATION WITH CORONARY ANGIOGRAM N/A 06/08/2012   Procedure: LEFT HEART CATHETERIZATION WITH CORONARY ANGIOGRAM;  Surgeon: Burnell Blanks, MD;  Location: [facility] CATH LAB;  Service: Cardiovascular;  Laterality: N/A;  . LEFT HEART CATHETERIZATION WITH CORONARY/GRAFT ANGIOGRAM N/A 12/10/2013   Procedure: LEFT HEART CATHETERIZATION WITH Beatrix Fetters;  Surgeon: [name], MD;  Location: [facility] CATH LAB;  Service: Cardiovascular;  Laterality: N/A;  . OVARY SURGERY     ovarian cancer  . POLYPECTOMY  03/09/2019   Procedure: POLYPECTOMY;  Surgeon: Rogene Houston, MD;  [facility];  Service: Endoscopy;;  cecal   . POLYPECTOMY N/A 09/26/2019   Procedure: DUODENAL POLYPECTOMY;  Surgeon: Rogene Houston, MD;  [facility];  Service: Endoscopy;  Laterality: N/A;  . REVISION OF ARTERIOVENOUS GORETEX GRAFT N/A 02/24/2017   Procedure: REVISION OF ARTERIOVENOUS GORETEX GRAFT (RESECTION);  Surgeon: Katha Cabal, MD;  Location: [facility] ORS;  Service: Vascular;  Laterality: N/A;  . REVISON OF ARTERIOVENOUS FISTULA Left 06/19/2020   Procedure: REVISION OF LEFT UPPER ARM AV GRAFT WITH INTERPOSITION JUMP GRAFT USING 6MM GORE LIMB;  Surgeon: Marty Heck, MD;  Location:  [facility];  Service: Vascular;  Laterality: Left;  . SHUNTOGRAM N/A 10/15/2013   Procedure: Fistulogram;  Surgeon: Serafina Mitchell, MD;  Location: [facility] CATH LAB;  Service: Cardiovascular;  Laterality: N/A;  . THROMBECTOMY / ARTERIOVENOUS GRAFT REVISION  2011   left upper arm  . TUBAL LIGATION  1980's  . UPPER EXTREMITY ANGIOGRAPHY Bilateral 12/06/2016   Procedure: Upper Extremity Angiography;  Surgeon: Katha Cabal, MD;  Location: [place] [facility];  Service: Cardiovascular;  Laterality: Bilateral;  . UPPER EXTREMITY INTERVENTION Left 06/06/2017   Procedure: UPPER EXTREMITY INTERVENTION;  Surgeon: Katha Cabal, MD;  Location: [place] [facility];  Service: Cardiovascular;  Laterality: Left;      Social History:      Social History   Tobacco Use  . Smoking status: Never Smoker  . Smokeless tobacco: Never Used  Substance Use Topics  . Alcohol use: No    Alcohol/week: 0.0 standard drinks       Family History :     Family History  Problem Relation Age of Onset  . Heart disease Mother        Heart Disease before age 78  . Hyperlipidemia Mother   . Hypertension Mother   . Diabetes Mother   . Heart attack Mother   . Heart disease Father        Heart Disease before age 15  . Hyperlipidemia Father   . Hypertension Father   . Diabetes Father   . Diabetes Sister   . Hypertension Sister   . Diabetes Brother   . Hyperlipidemia Brother   . Heart attack Brother   . Hypertension Sister   . Heart attack Brother   . Colon cancer Child 34  . Other Other        noncontributory for early CAD  .  Esophageal cancer Neg Hx   . Liver disease Neg Hx   . Kidney disease Neg Hx   . Colon polyps Neg Hx       Home Medications:   Prior to Admission medications   Medication Sig Start Date End Date Taking? Authorizing Provider  albuterol (VENTOLIN HFA) 108 (90 Base) MCG/ACT inhaler Inhale 2 puffs into the lungs every 6 (six) hours as needed for wheezing or shortness of breath.    Yes [provider]  amiodarone (PACERONE) 200 MG tablet Take 1 tablet (200 mg total) by mouth daily. Starting July 2 12/17/19  Yes Strader, Fransisco Hertz, PA-C  aspirin EC 81 MG tablet Take 81 mg by mouth daily.  09/27/19  Yes Rehman, Mechele Dawley, MD  dicyclomine (BENTYL) 10 MG capsule Take 10 mg by mouth 2 (two) times daily.   Yes [provider]  fluconazole (DIFLUCAN) 100 MG tablet Take 100 mg by mouth daily. 08/17/20  Yes [provider]  fluticasone (FLONASE) 50 MCG/ACT nasal spray Place 1 spray into both nostrils at bedtime as needed for allergies.    Yes [provider]  gabapentin (NEURONTIN) 100 MG capsule Take 100 mg by mouth daily.  04/16/20  Yes [provider]  isosorbide mononitrate (IMDUR) 60 MG 24 hr tablet Take 1 tablet (60 mg total) by mouth 2 (two) times daily. 08/10/20 11/08/20 Yes BranchAlphonse Guild, MD  lidocaine-prilocaine (EMLA) cream Apply 1 application topically every Monday, Wednesday, and Friday. Prior to dialysis   Yes [provider]  loratadine (CLARITIN) 10 MG tablet Take 1 tablet (10 mg total) by mouth daily as needed for allergies. 07/14/18  Yes Hosie Poisson, MD  multivitamin (RENA-VIT) TABS tablet Take 1 tablet by mouth daily.   Yes [provider]  nitroGLYCERIN (NITROSTAT) 0.4 MG SL tablet Place 1 tablet (0.4 mg total) under the tongue every 5 (five) minutes as needed for chest pain. 05/07/20  Yes Johnson, Clanford L, MD  omeprazole (PRILOSEC) 40 MG capsule Take 1 capsule (40 mg total) by mouth daily before breakfast. 03/31/20  Yes Laurine Blazer B, PA-C  ranolazine (RANEXA) 500 MG 12 hr tablet Take 1 tablet (500 mg total) by mouth 2 (two) times daily. 08/20/20  Yes Verta Ellen., NP  sevelamer carbonate (RENVELA) 800 MG tablet Take 2 tablets (1,600 mg total) by mouth 3 (three) times daily with meals. 06/20/20  Yes Meccariello, Bernita Raisin, DO  simvastatin (ZOCOR) 20 MG tablet Take 1 tablet (20 mg total) by mouth at  bedtime. 07/07/20  Yes Verta Ellen., NP  cyclobenzaprine (FLEXERIL) 5 MG tablet Take 5 mg by mouth 3 (three) times daily as needed. Patient not taking: Reported on 08/30/2020 06/22/20   [provider]  ketoconazole (NIZORAL) 2 % cream Apply 1 application topically 3 (three) times daily. Patient not taking: No sig reported 08/17/20   [provider]     Allergies:     Allergies  Allergen Reactions  . Aspirin Other (See Comments)    High Doses Mess up her stomach; "makes my bowels have blood in them". Takes 81 mg EC Aspirin   . Penicillins Other (See Comments)    SYNCOPE? , "makes me real weak when I take it; like I'll pass out"  Has patient had a PCN reaction causing immediate rash, facial/tongue/throat swelling, SOB or lightheadedness with hypotension: Yes Has patient had a PCN reaction causing severe rash involving mucus membranes or skin necrosis: no Has patient had a PCN  reaction that required hospitalization no Has patient had a PCN reaction occurring within the last 10 years: no If all of the above  . Amlodipine Swelling  . Bactrim [Sulfamethoxazole-Trimethoprim] Rash  . Contrast Media [Iodinated Diagnostic Agents] Itching  . Iron Itching and Other (See Comments)    "they gave me iron in dialysis; had to give me Benadryl cause I had to have the iron" (05/02/2012)  . Nitrofurantoin Hives  . Tylenol [Acetaminophen] Itching and Other (See Comments)    Makes her feet on fire per pt  . Gabapentin Other (See Comments)    Unknown reaction  . Hydralazine Itching  . Dexilant [Dexlansoprazole] Other (See Comments)    Upset stomach  . Levaquin [Levofloxacin In D5w] Rash  . Morphine And Related Itching and Other (See Comments)    Itching in feet  . Plavix [Clopidogrel Bisulfate] Rash  . Protonix [Pantoprazole Sodium] Rash  . Venofer [Ferric Oxide] Itching and Other (See Comments)    Patient reports using Benadryl prior to doses as [facility]      Physical Exam:   Vitals  Blood pressure (!) 173/71, pulse 68, temperature 98.3 F (36.8 C), temperature source Oral, resp. rate 18, height 5\' 1"  (1.549 m), weight 63.5 kg, SpO2 97 %.  1.  General: Patient lying supine in bed no acute distress 2. Psychiatric: Mood and behavior normal for situation, pleasant, cooperative with exam  3. Neurologic: Face is symmetric, speech and language are normal, equal strength in the upper and lower extremities bilaterally, equal sensation in the upper and lower extremities bilaterally, cerebellar function intact, unpredictable myoclonic jerking of upper extremities lower extremities neck, and face, but not at all body parts at the same time  4. HEENMT:  Head is atraumatic, normocephalic, pupils reactive to light, neck is supple, trachea is midline  5. Respiratory : Lungs are clear to auscultation bilaterally without wheezing, rhonchi, rales  6. Cardiovascular : Heart rate is normal, rhythm is regular, no murmurs rubs or gallops  7. Gastrointestinal:  Abdomen is soft, non distended, and non tender to palpation  8. Skin:  Skin is warm, dry, and intact without acute lesions on limited exam  9.Musculoskeletal:  No peripheral edema, no calf tenderness, no acute deformity.     Data Review:    CBC Recent Labs  Lab 08/30/20 2308  WBC 5.9  HGB 10.5*  HCT 34.7*  PLT 207  MCV 120.5*  MCH 36.5*  MCHC 30.3  RDW 17.4*  LYMPHSABS 1.0  MONOABS 0.5  EOSABS 0.3  BASOSABS 0.1   ------------------------------------------------------------------------------------------------------------------  Results for orders placed or performed during the hospital encounter of 08/30/20 (from the past 48 hour(s))  CBC with Differential     Status: Abnormal   Collection Time: 08/30/20 11:08 PM  Result Value Ref Range   WBC 5.9 4.0 - 10.5 K/uL   RBC 2.88 (L) 3.87 - 5.11 MIL/uL   Hemoglobin 10.5 (L) 12.0 - 15.0 g/dL   HCT 34.7 (L) 36.0 - 46.0 %   MCV 120.5  (H) 80.0 - 100.0 fL   MCH 36.5 (H) 26.0 - 34.0 pg   MCHC 30.3 30.0 - 36.0 g/dL   RDW 17.4 (H) 11.5 - 15.5 %   Platelets 207 150 - 400 K/uL   nRBC 0.0 0.0 - 0.2 %   Neutrophils Relative % 67 %   Neutro Abs 4.0 1.7 - 7.7 K/uL   Lymphocytes Relative 17 %   Lymphs Abs 1.0 0.7 - 4.0 K/uL  Monocytes Relative 8 %   Monocytes Absolute 0.5 0.1 - 1.0 K/uL   Eosinophils Relative 6 %   Eosinophils Absolute 0.3 0.0 - 0.5 K/uL   Basophils Relative 2 %   Basophils Absolute 0.1 0.0 - 0.1 K/uL   Immature Granulocytes 0 %   Abs Immature Granulocytes 0.02 0.00 - 0.07 K/uL    Comment: Performed at [facility], [address], [place], [place] [address]  Basic metabolic panel     Status: Abnormal   Collection Time: 08/30/20 11:08 PM  Result Value Ref Range   Sodium 136 135 - 145 mmol/L   Potassium 5.2 (H) 3.5 - 5.1 mmol/L   Chloride 96 (L) 98 - 111 mmol/L   CO2 25 22 - 32 mmol/L   Glucose, Bld 84 70 - 99 mg/dL    Comment: Glucose reference range applies only to samples taken after fasting for at least 8 hours.   BUN 49 (H) 8 - 23 mg/dL   Creatinine, Ser 8.68 (H) 0.44 - 1.00 mg/dL   Calcium 9.8 8.9 - 10.3 mg/dL   GFR, Estimated 4 (L) >60 mL/min    Comment: (NOTE) Calculated using the CKD-EPI Creatinine Equation (2021)    Anion gap 15 5 - 15    Comment: Performed at [facility], [address], [place], [place] [address]  Magnesium     Status: None   Collection Time: 08/30/20 11:08 PM  Result Value Ref Range   Magnesium 1.9 1.7 - 2.4 mg/dL    Comment: Performed at [facility], [address], [place], [place] [address]  Hepatic function panel     Status: Abnormal   Collection Time: 08/30/20 11:08 PM  Result Value Ref Range   Total Protein 6.6 6.5 - 8.1 g/dL   Albumin 3.6 3.5 - 5.0 g/dL   AST 8 (L) 15 - 41 U/L   ALT 12 0 - 44 U/L   Alkaline Phosphatase 86 38 - 126 U/L   Total Bilirubin 0.8 0.3 - 1.2 mg/dL   Bilirubin, Direct <0.1 0.0 - 0.2 mg/dL   Indirect Bilirubin NOT CALCULATED 0.3 -  0.9 mg/dL    Comment: Performed at [facility], [address], [place], [place] [address]   *Note: Due to a large number of results and/or encounters for the requested time period, some results have not been displayed. A complete set of results can be found in Results Review.    Chemistries  Recent Labs  Lab 08/30/20 2308  NA 136  K 5.2*  CL 96*  CO2 25  GLUCOSE 84  BUN 49*  CREATININE 8.68*  CALCIUM 9.8  MG 1.9  AST 8*  ALT 12  ALKPHOS 86  BILITOT 0.8   ------------------------------------------------------------------------------------------------------------------  ------------------------------------------------------------------------------------------------------------------ GFR: Estimated Creatinine Clearance: 4.4 mL/min (A) (by C-G formula based on SCr of 8.68 mg/dL (H)). Liver Function Tests: Recent Labs  Lab 08/30/20 2308  AST 8*  ALT 12  ALKPHOS 86  BILITOT 0.8  PROT 6.6  ALBUMIN 3.6   No results for input(s): LIPASE, AMYLASE in the last 168 hours. No results for input(s): AMMONIA in the last 168 hours. Coagulation Profile: No results for input(s): INR, PROTIME in the last 168 hours. Cardiac Enzymes: No results for input(s): CKTOTAL, CKMB, CKMBINDEX, TROPONINI in the last 168 hours. BNP (last 3 results) No results for input(s): PROBNP in the last 8760 hours. HbA1C: No results for input(s): HGBA1C in the last 72 hours. CBG: No results for input(s): GLUCAP in the last 168  hours. Lipid Profile: No results for input(s): CHOL, HDL, LDLCALC, TRIG, CHOLHDL, LDLDIRECT in the last 72 hours. Thyroid Function Tests: No results for input(s): TSH, T4TOTAL, FREET4, T3FREE, THYROIDAB in the last 72 hours. Anemia Panel: No results for input(s): VITAMINB12, FOLATE, FERRITIN, TIBC, IRON, RETICCTPCT in the last 72 hours.  --------------------------------------------------------------------------------------------------------------- Urine analysis:    Component  Value Date/Time   COLORURINE YELLOW 12/16/2012 1919   APPEARANCEUR CLOUDY (A) 12/16/2012 1919   LABSPEC 1.009 12/16/2012 1919   PHURINE 7.5 12/16/2012 1919   GLUCOSEU NEGATIVE 12/16/2012 1919   HGBUR TRACE (A) 12/16/2012 1919   BILIRUBINUR NEGATIVE 12/16/2012 1919   KETONESUR NEGATIVE 12/16/2012 1919   PROTEINUR 100 (A) 12/16/2012 1919   UROBILINOGEN 0.2 12/16/2012 1919   NITRITE NEGATIVE 12/16/2012 1919   LEUKOCYTESUR SMALL (A) 12/16/2012 1919      Imaging Results:    CT Head Wo Contrast  Result Date: 08/30/2020 CLINICAL DATA:  Neuro deficit, stroke suspected, jerking tremor with spasm since yesterday, fall today head strike on carpeted flooring EXAM: CT HEAD WITHOUT CONTRAST TECHNIQUE: Contiguous axial images were obtained from the base of the skull through the vertex without intravenous contrast. COMPARISON:  CT 01/31/2018 FINDINGS: Brain: No evidence of acute infarction, hemorrhage, hydrocephalus, extra-axial collection, visible mass lesion or mass effect. Symmetric prominence of the ventricles, cisterns and sulci compatible with parenchymal volume loss. Patchy areas of white matter hypoattenuation are most compatible with chronic microvascular angiopathy. Vascular: Atherosclerotic calcification of the carotid siphons and intradural vertebral arteries. No hyperdense vessel. Skull: No significant scalp swelling or large hematoma. No calvarial fracture or acute osseous abnormalities. Sinuses/Orbits: Paranasal sinuses and mastoid air cells are predominantly clear. Included orbital structures are unremarkable. Other: Debris in the left external auditory canal. IMPRESSION: 1. No acute intracranial abnormality. If there is persisting concern for acute infarction, MRI is more sensitive and specific for early features of ischemia. 2. Chronic microvascular angiopathy and parenchymal volume loss. Intracranial atherosclerosis. 3. Debris in the left external auditory canal, correlate for cerumen  impaction. Electronically Signed   By: Lovena Le M.D.   On: 08/30/2020 22:57   DG Chest Port 1 View  Result Date: 08/30/2020 CLINICAL DATA:  Bilateral leg jerking and spasm since yesterday, fall today EXAM: PORTABLE CHEST 1 VIEW COMPARISON:  Radiograph 08/10/2020 FINDINGS: Some chronically coarsened interstitial and bronchitic changes similar to prior studies with some additional bandlike regions of scarring and or atelectasis in both lung bases. Stable prominent cardiomediastinal silhouette with postsurgical changes from prior sternotomy and CABG as well as coronary stenting. The aorta is calcified. The remaining cardiomediastinal contours are unremarkable. No acute osseous or soft tissue abnormality. Degenerative changes are present in the imaged spine and shoulders. Telemetry leads overlie the chest. Vascular stenting in the left axilla. IMPRESSION: 1. No acute cardiopulmonary abnormality. Chronic findings as described above. 2. Stable postoperative changes from prior sternotomy, CABG and coronary stenting. Electronically Signed   By: Lovena Le M.D.   On: 08/30/2020 23:03       Assessment & Plan:    Active Problems:   Myoclonus   1. Myoclonus 1. New onset myoclonic movements of whole body 2. Newest medication is Ranexa - not likely a reaction to that medication - but holding 3. Patient has had no change to her gabapentin dose, and has been on it for months - holding as myoclonic activity can be a side effect of this medication 4. Consult neuro in the AM 5. Trial dose of cogentin  6. Continue to  monitor 2. ESRD with hyperkalemia 1. Consult nephro for routine dialysis 2. Continue Renvela 3. Paroxysmal Afib 1. Continue amiodarone 4. CAD 1. Continue imdur 2. Hold ranexa 3. Continue simvastatin 5.  6.    DVT Prophylaxis-  heparin - SCDs   AM Labs Ordered, also please review Full Orders  Family Communication: Admission, patients condition and plan of care including tests  being ordered have been discussed with the patient and husband who indicate understanding and agree with the plan and Code Status.  Code Status:  Full  Admission status: Observation  Time spent in minutes : [place]

## 2020-08-31 NOTE — ED Notes (Signed)
RN attempted to initiate IV Access. 2 attempts unsuccessful with using IV Ultrasound and Infrared Vein Finder. Pt having jerking movements of extremities during attempts.

## 2020-08-31 NOTE — ED Notes (Signed)
Called Vascular wellness. Kayla RN was able to gain IV access.

## 2020-08-31 NOTE — Progress Notes (Signed)
IV team consulted for IV access. Patient ESRD and restricted left extremity. Right extremity vasculature very limited with assessment via Ultrasound. Veins available that remain pliable are deep and small and would not accommodate 20 G long catheter, smaller bore long catheters are not available at this facility. Currently, the patient does not have IV fluids or medications ordered, and she is able to take PO medications without difficulty. Would advise to hold off placing peripheral IV at this time and reassess should the patient's needs change during hospitalization, with consideration for Central Line access if need does arise for IV therapy.

## 2020-08-31 NOTE — Procedures (Signed)
   HEMODIALYSIS TREATMENT NOTE:  Transfer to med/surg floor was delayed pending IV access.    Difficult cannulation of AVG.  Bandages from last treatment indicated multiple cannulations.  Max Qb 350 due to tenuous placement of both needles.  3.5 hour session completed.  As per usual, BP readings fluctuated broadly:  198/73 to 107/51 (symptomatic) to 174/81.  Net UF 1.4 liters.  She is more confused than on previous admissions.  She is normally alert, engaged in the treatment, and cooperative.  Tonight, she is oriented to self only.  In addition to the jerking movements, she is restless/fidgety--pulling at blankets, reaching for things in the air, and has some nonsensical speech, jumping from one subject to another.  She repeatedly requested to "get up, go to the bedroom, go in there and lay down" and was genuinely surprised each time to learn she was undergoing dialysis.  Neuro consult is pending.    Report given at bedside to Deeann Dowse, RN.   Rockwell Alexandria, RN

## 2020-08-31 NOTE — ED Notes (Signed)
Spoke with Dr. Dyann Kief about needing to gain IV access for this pt. Prior to moving to floor. SWAT nurse was unable to gain access. Dr. Dyann Kief requested that vascular wellness be contacted or see if the EDP could place an IJ. Per EDP this nurse needed to contact vascular wellness. This nurse called vascular wellness the ETA is between 1700-1900. If the time changes vascular wellness will notify this nurse.

## 2020-08-31 NOTE — Consult Note (Signed)
[place] Date: 08/30/2020 08/31/2020 [place]ene Agent Requesting Physician:  Madera MD  Reason for Consult:  ESRD, myoclonus HPI:  80F ESRD [place] in [place] who presented to the ED yesterday with progressive and recent onset of diffuse jerking and difficulty speaking.  This progressed to the point of her having a fall yesterday at church.  She receives dialysis on Mondays, Wednesdays, Fridays using a left upper arm AV fistula with last treatment on 2/18.  Other past history includes CAD/CABG with recent addition of Ranexa on 2/10, paroxysmal atrial fibrillation on amiodarone, chronic neuropathy/pain on gabapentin, hypertension, diastolic heart failure, history of GI bleed from colonic AVMs.  Work-up in the ED revealed potassium 5.2, HCO3 25, BUN 49.  Hemoglobin is 10.5, WBC 5.9, platelets 207.  CT of the head with no acute abnormalities but chronic microvascular angiopathy and parenchymal volume loss noted.  Ranexa, gabapentin held at admission.     ROS Balance of 12 systems is negative w/ exceptions as above  PMH  Past Medical History:  Diagnosis Date  . Acute on chronic respiratory failure with hypoxia ([place]) 10/10/2016  . Anxiety   . Arthritis   . AVM (arteriovenous malformation) of colon   . CAD (coronary artery disease)    a. s/p CABG in 2013 b. DES to D1 in 10/2016. c. cath in 07/2018 showing patent grafts with occlusion of D1 at prior stent site and progression of PDA disease --> medical management recommended  . Carotid artery disease ([place])    a. 37-16% LICA, 03/6788   . Chronic anemia   . Chronic bronchitis ([place])   . Chronic diastolic CHF (congestive heart failure) ([place])    a. 02/2012 Echo EF 60-65%, nl wall motion, Gr 1 DD, mod MR  . Colon cancer ([place]) 1992  . Esophageal stricture   . ESRD on hemodialysis ([place])    ESRD due to HTN, started dialysis 2011 and gets HD at [facility] with Dr Hinda Lenis on MWF schedule.  Access is LUA AVF as of Sept 2014.   Marland Kitchen GERD  (gastroesophageal reflux disease)   . High cholesterol 12/2011  . History of blood transfusion 07/2011; 12/2011; 01/2012 X 2; 04/2012  . History of gout   . History of lower GI bleeding   . Hypertension   . Iron deficiency anemia   . Mitral regurgitation    a. Moderate by echo, 02/2012  . Myocardial infarction ([place])   . NSVT (nonsustained ventricular tachycardia) ([place])   . Ovarian cancer ([place]) 1992  . PAF (paroxysmal atrial fibrillation) ([place])   . Pneumonia ~ 2009  . PUD (peptic ulcer disease)   . TIA (transient ischemic attack)    PSH  Past Surgical History:  Procedure Laterality Date  . A/V SHUNTOGRAM Left 03/19/2019   Procedure: A/V SHUNTOGRAM;  Surgeon: Katha Cabal, MD;  Location: [place] [facility] LAB;  Service: Cardiovascular;  Laterality: Left;  . ABDOMINAL HYSTERECTOMY  1992  . APPENDECTOMY  06/1990  . AV FISTULA PLACEMENT  07/2009   left upper arm  . AV FISTULA PLACEMENT Right 09/06/2016   Procedure: RIGHT FOREARM ARTERIOVENOUS (AV) GRAFT;  Surgeon: Elam Dutch, MD;  [facility] OR;  Service: Vascular;  Laterality: Right;  . AV FISTULA PLACEMENT N/A 02/24/2017   Procedure: INSERTION OF ARTERIOVENOUS (AV) GORE-TEX GRAFT ARM (BRACHIAL AXILLARY);  Surgeon: Katha Cabal, MD;  Location: [facility] ORS;  Service: Vascular;  Laterality: N/A;  . [place] Right 09/06/2016   Procedure: REMOVAL OF Right  Arm ARTERIOVENOUS GORETEX GRAFT and Vein Patch angioplasty of brachial artery;  Surgeon: Angelia Mould, MD;  Location: [place];  Service: Vascular;  Laterality: Right;  . BIOPSY  09/26/2019   Procedure: BIOPSY;  Surgeon: Rogene Houston, MD;  [facility];  Service: Endoscopy;;  . COLON RESECTION  1992  . COLON SURGERY    . COLONOSCOPY N/A 03/09/2019   Procedure: COLONOSCOPY;  Surgeon: Rogene Houston, MD;  [facility];  Service: Endoscopy;  Laterality: N/A;  . CORONARY ANGIOPLASTY WITH STENT PLACEMENT  12/15/11   "2"  . CORONARY ANGIOPLASTY WITH  STENT PLACEMENT  y/2013   "1; makes total of 3" (05/02/2012)  . CORONARY ARTERY BYPASS GRAFT  06/13/2012   Procedure: CORONARY ARTERY BYPASS GRAFTING (CABG);  Surgeon: Grace Isaac, MD;  Location: [place];  Service: Open Heart Surgery;  Laterality: N/A;  cabg x four;  using left internal mammary artery, and left leg greater saphenous vein harvested endoscopically  . CORONARY STENT INTERVENTION N/A 10/13/2016   Procedure: Coronary Stent Intervention;  Surgeon: Troy Sine, MD;  Location: [place] CV LAB;  Service: Cardiovascular;  Laterality: N/A;  . DIALYSIS/PERMA CATHETER REMOVAL N/A 04/18/2017   Procedure: DIALYSIS/PERMA CATHETER REMOVAL;  Surgeon: Katha Cabal, MD;  Location: [place] [facility] LAB;  Service: Cardiovascular;  Laterality: N/A;  . DILATION AND CURETTAGE OF UTERUS    . ESOPHAGOGASTRODUODENOSCOPY  01/20/2012   Procedure: ESOPHAGOGASTRODUODENOSCOPY (EGD);  Surgeon: Ladene Artist, MD,FACG;  Location: [facility] ENDOSCOPY;  Service: Endoscopy;  Laterality: N/A;  . ESOPHAGOGASTRODUODENOSCOPY N/A 03/26/2013   Procedure: ESOPHAGOGASTRODUODENOSCOPY (EGD);  Surgeon: Irene Shipper, MD;  Location[facility] ENDOSCOPY;  Service: Endoscopy;  Laterality: N/A;  . ESOPHAGOGASTRODUODENOSCOPY N/A 04/30/2015   Procedure: ESOPHAGOGASTRODUODENOSCOPY (EGD);  Surgeon: Rogene Houston, MD;  [facility];  Service: Endoscopy;  Laterality: N/A;  1pm - moved to 10/20 @ 1:10  . ESOPHAGOGASTRODUODENOSCOPY N/A 07/29/2016   Procedure: ESOPHAGOGASTRODUODENOSCOPY (EGD);  Surgeon: Manus Gunning, MD;  Location: [place];  Service: Gastroenterology;  Laterality: N/A;  enteroscopy  . ESOPHAGOGASTRODUODENOSCOPY N/A 09/26/2019   Procedure: ESOPHAGOGASTRODUODENOSCOPY (EGD);  Surgeon: Rogene Houston, MD;  [facility];  Service: Endoscopy;  Laterality: N/A;  1250  . GIVENS CAPSULE STUDY N/A 03/07/2019   Procedure: GIVENS CAPSULE STUDY;  Surgeon: Rogene Houston, MD;  [facility];   Service: Endoscopy;  Laterality: N/A;  7:30  . INTRAOPERATIVE TRANSESOPHAGEAL ECHOCARDIOGRAM  06/13/2012   Procedure: INTRAOPERATIVE TRANSESOPHAGEAL ECHOCARDIOGRAM;  Surgeon: Grace Isaac, MD;  Location: [place];  Service: Open Heart Surgery;  Laterality: N/A;  . IR FLUORO GUIDE CV LINE RIGHT  06/17/2020  . IR GENERIC HISTORICAL  07/26/2016   IR FLUORO GUIDE CV LINE RIGHT 07/26/2016 Greggory Keen, MD [facility]  . IR GENERIC HISTORICAL  07/26/2016   IR US GUIDE VASC ACCESS RIGHT 07/26/2016 Greggory Keen, MD [facility]  . IR GENERIC HISTORICAL  08/02/2016   IR US GUIDE VASC ACCESS RIGHT 08/02/2016 Greggory Keen, MD [facility]  . IR GENERIC HISTORICAL  08/02/2016   IR FLUORO GUIDE CV LINE RIGHT 08/02/2016 Greggory Keen, MD [facility]  . IR RADIOLOGY PERIPHERAL GUIDED IV START  03/28/2017  . IR REMOVAL TUN CV CATH W/O FL  08/11/2020  . IR THROMBECTOMY AV FISTULA W/THROMBOLYSIS INC/SHUNT/IMG LEFT Left 06/17/2020  . IR US GUIDE VASC ACCESS LEFT  06/17/2020  . IR US GUIDE VASC ACCESS RIGHT  03/28/2017  . IR US GUIDE  VASC ACCESS RIGHT  06/17/2020  . LEFT HEART CATH AND CORONARY ANGIOGRAPHY N/A 09/20/2016   Procedure: Left Heart Cath and Coronary Angiography;  Surgeon: [name], MD;  Location: [place] CV LAB;  Service: Cardiovascular;  Laterality: N/A;  . LEFT HEART CATH AND CORS/GRAFTS ANGIOGRAPHY N/A 10/13/2016   Procedure: Left Heart Cath and Cors/Grafts Angiography;  Surgeon: Troy Sine, MD;  Location: [place] [facility] LAB;  Service: Cardiovascular;  Laterality: N/A;  . LEFT HEART CATH AND CORS/GRAFTS ANGIOGRAPHY N/A 07/13/2018   Procedure: LEFT HEART CATH AND CORS/GRAFTS ANGIOGRAPHY;  Surgeon: Martinique, Peter M, MD;  Location: [place] [facility];  Service: Cardiovascular;  Laterality: N/A;  . LEFT HEART CATHETERIZATION WITH CORONARY ANGIOGRAM N/A 12/15/2011   Procedure: LEFT HEART CATHETERIZATION WITH CORONARY ANGIOGRAM;  Surgeon: Burnell Blanks, MD;  Location: [facility] CATH LAB;  Service:  Cardiovascular;  Laterality: N/A;  . LEFT HEART CATHETERIZATION WITH CORONARY ANGIOGRAM N/A 01/10/2012   Procedure: LEFT HEART CATHETERIZATION WITH CORONARY ANGIOGRAM;  Surgeon: Peter M Martinique, MD;  Location: [facility] CATH LAB;  Service: Cardiovascular;  Laterality: N/A;  . LEFT HEART CATHETERIZATION WITH CORONARY ANGIOGRAM N/A 06/08/2012   Procedure: LEFT HEART CATHETERIZATION WITH CORONARY ANGIOGRAM;  Surgeon: Burnell Blanks, MD;  Location: [facility] CATH LAB;  Service: Cardiovascular;  Laterality: N/A;  . LEFT HEART CATHETERIZATION WITH CORONARY/GRAFT ANGIOGRAM N/A 12/10/2013   Procedure: LEFT HEART CATHETERIZATION WITH Beatrix Fetters;  Surgeon: [name], MD;  Location: [facility] CATH LAB;  Service: Cardiovascular;  Laterality: N/A;  . OVARY SURGERY     ovarian cancer  . POLYPECTOMY  03/09/2019   Procedure: POLYPECTOMY;  Surgeon: Rogene Houston, MD;  [facility];  Service: Endoscopy;;  cecal   . POLYPECTOMY N/A 09/26/2019   Procedure: DUODENAL POLYPECTOMY;  Surgeon: Rogene Houston, MD;  [facility];  Service: Endoscopy;  Laterality: N/A;  . REVISION OF ARTERIOVENOUS GORETEX GRAFT N/A 02/24/2017   Procedure: REVISION OF ARTERIOVENOUS GORETEX GRAFT (RESECTION);  Surgeon: Katha Cabal, MD;  Location: [facility] ORS;  Service: Vascular;  Laterality: N/A;  . REVISON OF ARTERIOVENOUS FISTULA Left 06/19/2020   Procedure: REVISION OF LEFT UPPER ARM AV GRAFT WITH INTERPOSITION JUMP GRAFT USING 6MM GORE LIMB;  Surgeon: Marty Heck, MD;  Location: [place];  Service: Vascular;  Laterality: Left;  . SHUNTOGRAM N/A 10/15/2013   Procedure: Fistulogram;  Surgeon: Serafina Mitchell, MD;  Location: [facility] CATH LAB;  Service: Cardiovascular;  Laterality: N/A;  . THROMBECTOMY / ARTERIOVENOUS GRAFT REVISION  2011   left upper arm  . TUBAL LIGATION  1980's  . UPPER EXTREMITY ANGIOGRAPHY Bilateral 12/06/2016   Procedure: Upper Extremity Angiography;  Surgeon: Katha Cabal, MD;   Location: [place] [facility];  Service: Cardiovascular;  Laterality: Bilateral;  . UPPER EXTREMITY INTERVENTION Left 06/06/2017   Procedure: UPPER EXTREMITY INTERVENTION;  Surgeon: Katha Cabal, MD;  Location: [place] [facility];  Service: Cardiovascular;  Laterality: Left;   FH  Family History  Problem Relation Age of Onset  . Heart disease Mother        Heart Disease before age 46  . Hyperlipidemia Mother   . Hypertension Mother   . Diabetes Mother   . Heart attack Mother   . Heart disease Father        Heart Disease before age 66  . Hyperlipidemia Father   . Hypertension Father   . Diabetes Father   . Diabetes Sister   . Hypertension Sister   .  Diabetes Brother   . Hyperlipidemia Brother   . Heart attack Brother   . Hypertension Sister   . Heart attack Brother   . Colon cancer Child 31  . Other Other        noncontributory for early CAD  . Esophageal cancer Neg Hx   . Liver disease Neg Hx   . Kidney disease Neg Hx   . Colon polyps Neg Hx    SH  reports that she has never smoked. She has never used smokeless tobacco. She reports that she does not drink alcohol and does not use drugs. Allergies  Allergies  Allergen Reactions  . Aspirin Other (See Comments)    High Doses Mess up her stomach; "makes my bowels have blood in them". Takes 81 mg EC Aspirin   . Penicillins Other (See Comments)    SYNCOPE? , "makes me real weak when I take it; like I'll pass out"  Has patient had a PCN reaction causing immediate rash, facial/tongue/throat swelling, SOB or lightheadedness with hypotension: Yes Has patient had a PCN reaction causing severe rash involving mucus membranes or skin necrosis: no Has patient had a PCN reaction that required hospitalization no Has patient had a PCN reaction occurring within the last 10 years: no If all of the above  . Amlodipine Swelling  . Bactrim [Sulfamethoxazole-Trimethoprim] Rash  . Contrast Media [Iodinated Diagnostic Agents] Itching   . Iron Itching and Other (See Comments)    "they gave me iron in dialysis; had to give me Benadryl cause I had to have the iron" (05/02/2012)  . Nitrofurantoin Hives  . Tylenol [Acetaminophen] Itching and Other (See Comments)    Makes her feet on fire per pt  . Gabapentin Other (See Comments)    Unknown reaction  . Hydralazine Itching  . Dexilant [Dexlansoprazole] Other (See Comments)    Upset stomach  . Levaquin [Levofloxacin In D5w] Rash  . Morphine And Related Itching and Other (See Comments)    Itching in feet  . Plavix [Clopidogrel Bisulfate] Rash  . Protonix [Pantoprazole Sodium] Rash  . Venofer [Ferric Oxide] Itching and Other (See Comments)    Patient reports using Benadryl prior to doses as [facility] medications Prior to Admission medications   Medication Sig Start Date End Date Taking? Authorizing Provider  albuterol (VENTOLIN HFA) 108 (90 Base) MCG/ACT inhaler Inhale 2 puffs into the lungs every 6 (six) hours as needed for wheezing or shortness of breath.   Yes [provider]  amiodarone (PACERONE) 200 MG tablet Take 1 tablet (200 mg total) by mouth daily. Starting July 2 12/17/19  Yes Strader, Fransisco Hertz, PA-C  aspirin EC 81 MG tablet Take 81 mg by mouth daily.  09/27/19  Yes Rehman, Mechele Dawley, MD  dicyclomine (BENTYL) 10 MG capsule Take 10 mg by mouth 2 (two) times daily.   Yes [provider]  fluconazole (DIFLUCAN) 100 MG tablet Take 100 mg by mouth daily. 08/17/20  Yes [provider]  fluticasone (FLONASE) 50 MCG/ACT nasal spray Place 1 spray into both nostrils at bedtime as needed for allergies.    Yes [provider]  gabapentin (NEURONTIN) 100 MG capsule Take 100 mg by mouth daily.  04/16/20  Yes [provider]  isosorbide mononitrate (IMDUR) 60 MG 24 hr tablet Take 1 tablet (60 mg total) by mouth 2 (two) times daily. 08/10/20 11/08/20 Yes BranchAlphonse Guild, MD  lidocaine-prilocaine (EMLA) cream Apply 1 application  topically every Monday, Wednesday,  and Friday. Prior to dialysis   Yes [provider]  loratadine (CLARITIN) 10 MG tablet Take 1 tablet (10 mg total) by mouth daily as needed for allergies. 07/14/18  Yes Hosie Poisson, MD  multivitamin (RENA-VIT) TABS tablet Take 1 tablet by mouth daily.   Yes [provider]  nitroGLYCERIN (NITROSTAT) 0.4 MG SL tablet Place 1 tablet (0.4 mg total) under the tongue every 5 (five) minutes as needed for chest pain. 05/07/20  Yes Johnson, Clanford L, MD  omeprazole (PRILOSEC) 40 MG capsule Take 1 capsule (40 mg total) by mouth daily before breakfast. 03/31/20  Yes Laurine Blazer B, PA-C  ranolazine (RANEXA) 500 MG 12 hr tablet Take 1 tablet (500 mg total) by mouth 2 (two) times daily. 08/20/20  Yes Verta Ellen., NP  sevelamer carbonate (RENVELA) 800 MG tablet Take 2 tablets (1,600 mg total) by mouth 3 (three) times daily with meals. 06/20/20  Yes Meccariello, Bernita Raisin, DO  simvastatin (ZOCOR) 20 MG tablet Take 1 tablet (20 mg total) by mouth at bedtime. 07/07/20  Yes Verta Ellen., NP  cyclobenzaprine (FLEXERIL) 5 MG tablet Take 5 mg by mouth 3 (three) times daily as needed. Patient not taking: Reported on 08/30/2020 06/22/20   [provider]  ketoconazole (NIZORAL) 2 % cream Apply 1 application topically 3 (three) times daily. Patient not taking: No sig reported 08/17/20   [provider]    Current Medications Scheduled Meds: . amiodarone  200 mg Oral Daily  . aspirin EC  81 mg Oral Daily  . heparin  5,000 Units Subcutaneous Q8H  . isosorbide mononitrate  60 mg Oral BID  . sevelamer carbonate  1,600 mg Oral TID WC  . simvastatin  20 mg Oral QHS   Continuous Infusions: PRN Meds:.acetaminophen **OR** acetaminophen, albuterol, cyclobenzaprine, fluticasone, loratadine, ondansetron **OR** ondansetron (ZOFRAN) IV, polyethylene glycol  CBC Recent Labs  Lab 08/30/20 2308 08/31/20 0630  WBC 5.9 5.8  NEUTROABS 4.0 4.0   HGB 10.5* 9.9*  HCT 34.7* 32.2*  MCV 120.5* 119.7*  PLT 207 846   Basic Metabolic Panel Recent Labs  Lab 08/30/20 2308 08/31/20 0630  NA 136 138  K 5.2* 5.3*  CL 96* 97*  CO2 25 25  GLUCOSE 84 76  BUN 49* 54*  CREATININE 8.68* 9.83*  CALCIUM 9.8 10.0  PHOS  --  6.0*    Physical Exam  Blood pressure (!) 173/60, pulse 76, temperature 98.3 F (36.8 C), temperature source Oral, resp. rate 16, height 5\' 1"  (1.549 m), weight 63.5 kg, SpO2 97 %. GEN: Conversant, trouble finding words with some expressive aphasia; noted diffuse intermittent myoclonus ENT: NCAT EYES: EOMI CV: Regular, normal S1 and S2 PULM: Clear bilaterally ABD: Soft, nontender SKIN: No rashes or lesions EXT: No peripheral edema VASCULAR: Left upper arm AV fistula with bruit and thrill   Assessment 22F ESRD presenting with myoclonus and expressive aphasia after initiation of ranolazine, on gabapentin.  I suspect that this is medication related, and these have been stopped.  1. ESRD MWF LUE AVF [facility] 2. Myoclonus 3. Expressive aphasia 4. CAD/CABG with recent initiation of ranolazine 5. Diastolic heart failure 6. Hypertension 7. Anemia 8. 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 HD today: 3.5h, LUE AVF, 15g, 400/600, 2K, No heparin 2. Hold gabapentin and ranolizine 3. Will req PT/OT eval 4. Cont binders 5. Daily weights, Daily Renal Panel, Strict I/Os, Avoid nephrotoxins (NSAIDs, judicious IV Contrast) 6. Will follow along  Thurmond Butts  B Venida Tsukamoto  08/31/2020, 9:29 AM

## 2020-08-31 NOTE — Care Management Obs Status (Signed)
[place] NOTIFICATION   Patient Details  Name: Shawna [name] MRN: [id number] Date of Birth: 1940/10/07   Medicare Observation Status Notification Given:  Yes    [name] 08/31/2020, 3:17 PM

## 2020-09-01 DIAGNOSIS — I132 Hypertensive heart and chronic kidney disease with heart failure and with stage 5 chronic kidney disease, or end stage renal disease: Secondary | ICD-10-CM | POA: Diagnosis present

## 2020-09-01 DIAGNOSIS — D631 Anemia in chronic kidney disease: Secondary | ICD-10-CM | POA: Diagnosis present

## 2020-09-01 DIAGNOSIS — G9341 Metabolic encephalopathy: Secondary | ICD-10-CM

## 2020-09-01 DIAGNOSIS — K219 Gastro-esophageal reflux disease without esophagitis: Secondary | ICD-10-CM | POA: Diagnosis present

## 2020-09-01 DIAGNOSIS — N2581 Secondary hyperparathyroidism of renal origin: Secondary | ICD-10-CM | POA: Diagnosis present

## 2020-09-01 DIAGNOSIS — I5032 Chronic diastolic (congestive) heart failure: Secondary | ICD-10-CM | POA: Diagnosis present

## 2020-09-01 DIAGNOSIS — E538 Deficiency of other specified B group vitamins: Secondary | ICD-10-CM | POA: Diagnosis present

## 2020-09-01 DIAGNOSIS — G253 Myoclonus: Secondary | ICD-10-CM | POA: Diagnosis present

## 2020-09-01 DIAGNOSIS — R252 Cramp and spasm: Secondary | ICD-10-CM | POA: Diagnosis not present

## 2020-09-01 DIAGNOSIS — Z20822 Contact with and (suspected) exposure to covid-19: Secondary | ICD-10-CM | POA: Diagnosis present

## 2020-09-01 DIAGNOSIS — R4701 Aphasia: Secondary | ICD-10-CM | POA: Diagnosis present

## 2020-09-01 DIAGNOSIS — Z8543 Personal history of malignant neoplasm of ovary: Secondary | ICD-10-CM | POA: Diagnosis not present

## 2020-09-01 DIAGNOSIS — G629 Polyneuropathy, unspecified: Secondary | ICD-10-CM | POA: Diagnosis present

## 2020-09-01 DIAGNOSIS — M1990 Unspecified osteoarthritis, unspecified site: Secondary | ICD-10-CM | POA: Diagnosis present

## 2020-09-01 DIAGNOSIS — I252 Old myocardial infarction: Secondary | ICD-10-CM | POA: Diagnosis not present

## 2020-09-01 DIAGNOSIS — Z951 Presence of aortocoronary bypass graft: Secondary | ICD-10-CM | POA: Diagnosis not present

## 2020-09-01 DIAGNOSIS — F419 Anxiety disorder, unspecified: Secondary | ICD-10-CM | POA: Diagnosis present

## 2020-09-01 DIAGNOSIS — K589 Irritable bowel syndrome without diarrhea: Secondary | ICD-10-CM | POA: Diagnosis present

## 2020-09-01 DIAGNOSIS — W19XXXA Unspecified fall, initial encounter: Secondary | ICD-10-CM | POA: Diagnosis present

## 2020-09-01 DIAGNOSIS — R296 Repeated falls: Secondary | ICD-10-CM | POA: Diagnosis present

## 2020-09-01 DIAGNOSIS — I2510 Atherosclerotic heart disease of native coronary artery without angina pectoris: Secondary | ICD-10-CM | POA: Diagnosis present

## 2020-09-01 DIAGNOSIS — I480 Paroxysmal atrial fibrillation: Secondary | ICD-10-CM | POA: Diagnosis present

## 2020-09-01 DIAGNOSIS — Z8673 Personal history of transient ischemic attack (TIA), and cerebral infarction without residual deficits: Secondary | ICD-10-CM | POA: Diagnosis not present

## 2020-09-01 DIAGNOSIS — N186 End stage renal disease: Secondary | ICD-10-CM | POA: Diagnosis present

## 2020-09-01 DIAGNOSIS — E875 Hyperkalemia: Secondary | ICD-10-CM | POA: Diagnosis present

## 2020-09-01 LAB — CBC
HCT: 37.2 % (ref 36.0–46.0)
Hemoglobin: 11.3 g/dL — ABNORMAL LOW (ref 12.0–15.0)
MCH: 35.8 pg — ABNORMAL HIGH (ref 26.0–34.0)
MCHC: 30.4 g/dL (ref 30.0–36.0)
MCV: 117.7 fL — ABNORMAL HIGH (ref 80.0–100.0)
Platelets: 225 10*3/uL (ref 150–400)
RBC: 3.16 MIL/uL — ABNORMAL LOW (ref 3.87–5.11)
RDW: 17.5 % — ABNORMAL HIGH (ref 11.5–15.5)
WBC: 8.3 10*3/uL (ref 4.0–10.5)
nRBC: 0 % (ref 0.0–0.2)

## 2020-09-01 LAB — RENAL FUNCTION PANEL
Albumin: 3.8 g/dL (ref 3.5–5.0)
Anion gap: 13 (ref 5–15)
BUN: 26 mg/dL — ABNORMAL HIGH (ref 8–23)
CO2: 27 mmol/L (ref 22–32)
Calcium: 10.1 mg/dL (ref 8.9–10.3)
Chloride: 93 mmol/L — ABNORMAL LOW (ref 98–111)
Creatinine, Ser: 5.02 mg/dL — ABNORMAL HIGH (ref 0.44–1.00)
GFR, Estimated: 8 mL/min — ABNORMAL LOW (ref >60)
Glucose, Bld: 170 mg/dL — ABNORMAL HIGH (ref 70–99)
Phosphorus: 4.1 mg/dL (ref 2.5–4.6)
Potassium: 3.9 mmol/L (ref 3.5–5.1)
Sodium: 133 mmol/L — ABNORMAL LOW (ref 135–145)

## 2020-09-01 LAB — GLUCOSE, CAPILLARY: Glucose-Capillary: 103 mg/dL — ABNORMAL HIGH (ref 70–99)

## 2020-09-01 LAB — HEPATITIS B SURFACE ANTIGEN: Hepatitis B Surface Ag: NONREACTIVE

## 2020-09-01 MED ORDER — CYANOCOBALAMIN 1000 MCG/ML IJ SOLN
1000.0000 ug | Freq: Once | INTRAMUSCULAR | Status: AC
  Administered 2020-09-01: 1000 ug via INTRAMUSCULAR
  Filled 2020-09-01: qty 1

## 2020-09-01 NOTE — Consult Note (Signed)
Shawna A. Merlene Laughter, MD     [email or web address]          Shawna [name] is an 80 y.o. female.   ASSESSMENT/PLAN: 1.  Acute recurrent falls with the myoclonus/negative myoclonus likely due to initiation of Ranexa in the setting of hemodialysis and hyperkalemia.  Recommend discontinuation of the medication and use of a different agent.  The gait impairment and balance has improved.  CPK will be obtained.  She will likely need physical therapy.  Also consider checking orthostatics. 2.  Mildly confused/mild metabolic encephalopathy due to the above.  This has improved however. 3.  Vitamin B-12 deficiency which will be replaced.    This 80 year old black female who has a history of coronary disease and angina.  She previously was on the oral nitroglycerin medication but the few days ago on Thursday was started on Ranexa.  She reports responding very well to this medication with less chest pain on exertion.  However, she developed progressive jerky movements with a description being consistent with myoclonus and negative myoclonus involving the upper and lower extremities.  She reported waking up a couple days ago and having severe continuous jerky movements with inability to stand and fell a couple at times.  She appeared to have been somewhat confused yesterday and not well oriented but this has improved this evening.  She does not report having headaches or myalgias.  She does have joint pain.  She does not report focal weakness or numbness.  There are no reports of headaches, dysarthria or dysphagia.  She reports improvement in the jerking movements.  They have that improve significantly but she still feels weak and wobbly when she stands up.  The review of systems otherwise negative.    GENERAL: She is a doing well this afternoon.  She is feeding herself dinner.  HEENT: Neck is supple no trauma noted.  ABDOMEN: soft  EXTREMITIES: No edema; there is significant arthritic  changes noted in the upper and lower extremities.  BACK: Normal  SKIN: Normal by inspection.    MENTAL STATUS: Alert and oriented.  She is lucid and conversational.  Speech, language and cognition are generally intact. Judgment and insight normal.   CRANIAL NERVES: Pupils are equal, round and reactive to light and accomodation; extra ocular movements are full, there is no significant nystagmus; visual fields are full; upper and lower facial muscles are normal in strength and symmetric, there is no flattening of the nasolabial folds; tongue is midline; uvula is midline; shoulder elevation is normal.  MOTOR: Deltoids are 4+/5.  Distal strength were normal.  Hip flexion 4+/5 and dorsiflexion 5.  Bulk and tone are normal throughout.    COORDINATION: Left finger to nose is normal, right finger to nose is normal, No rest tremor; no intention tremor; no postural tremor; no bradykinesia.  No myoclonus or negative myoclonus elicited.  REFLEXES: Deep tendon reflexes are symmetrical and normal.   SENSATION: Normal to light touch, temperature, and pain.    Blood pressure (!) 165/81, pulse 87, temperature 98.7 F (37.1 C), resp. rate 16, height 5\' 1"  (1.549 m), weight 63.9 kg, SpO2 100 %.  Past Medical History:  Diagnosis Date  . Acute on chronic respiratory failure with hypoxia ([place]) 10/10/2016  . Anxiety   . Arthritis   . AVM (arteriovenous malformation) of colon   . CAD (coronary artery disease)    a. s/p CABG in 2013 b. DES to D1 in 10/2016. c. cath in 07/2018 showing patent  grafts with occlusion of D1 at prior stent site and progression of PDA disease --> medical management recommended  . Carotid artery disease ([place])    a. 50-93% LICA, 08/6710   . Chronic anemia   . Chronic bronchitis ([employer])   . Chronic diastolic CHF (congestive heart failure) ([place])    a. 02/2012 Echo EF 60-65%, nl wall motion, Gr 1 DD, mod MR  . Colon cancer ([place]) 1992  . Esophageal stricture   . ESRD on hemodialysis  ([place])    ESRD due to HTN, started dialysis 2011 and gets HD at [facility] with Dr Hinda Lenis on MWF schedule.  Access is LUA AVF as of Sept 2014.   Marland Kitchen GERD (gastroesophageal reflux disease)   . High cholesterol 12/2011  . History of blood transfusion 07/2011; 12/2011; 01/2012 X 2; 04/2012  . History of gout   . History of lower GI bleeding   . Hypertension   . Iron deficiency anemia   . Mitral regurgitation    a. Moderate by echo, 02/2012  . Myocardial infarction ([place])   . NSVT (nonsustained ventricular tachycardia) ([place])   . Ovarian cancer ([place]) 1992  . PAF (paroxysmal atrial fibrillation) ([place])   . Pneumonia ~ 2009  . PUD (peptic ulcer disease)   . TIA (transient ischemic attack)     Past Surgical History:  Procedure Laterality Date  . A/V SHUNTOGRAM Left 03/19/2019   Procedure: A/V SHUNTOGRAM;  Surgeon: Katha Cabal, MD;  Location: [place] [facility];  Service: Cardiovascular;  Laterality: Left;  . ABDOMINAL HYSTERECTOMY  1992  . APPENDECTOMY  06/1990  . AV FISTULA PLACEMENT  07/2009   left upper arm  . AV FISTULA PLACEMENT Right 09/06/2016   Procedure: RIGHT FOREARM ARTERIOVENOUS (AV) GRAFT;  Surgeon: Elam Dutch, MD;  [facility];  Service: Vascular;  Laterality: Right;  . AV FISTULA PLACEMENT N/A 02/24/2017   Procedure: INSERTION OF ARTERIOVENOUS (AV) GORE-TEX GRAFT ARM (BRACHIAL AXILLARY);  Surgeon: Katha Cabal, MD;  Location: [facility] ORS;  Service: Vascular;  Laterality: N/A;  . [place] Right 09/06/2016   Procedure: REMOVAL OF Right Arm ARTERIOVENOUS GORETEX GRAFT and Vein Patch angioplasty of brachial artery;  Surgeon: Angelia Mould, MD;  Location: [place];  Service: Vascular;  Laterality: Right;  . BIOPSY  09/26/2019   Procedure: BIOPSY;  Surgeon: Rogene Houston, MD;  [facility];  Service: Endoscopy;;  . COLON RESECTION  1992  . COLON SURGERY    . COLONOSCOPY N/A 03/09/2019   Procedure: COLONOSCOPY;  Surgeon: Rogene Houston, MD;   [facility];  Service: Endoscopy;  Laterality: N/A;  . CORONARY ANGIOPLASTY WITH STENT PLACEMENT  12/15/11   "2"  . CORONARY ANGIOPLASTY WITH STENT PLACEMENT  y/2013   "1; makes total of 3" (05/02/2012)  . CORONARY ARTERY BYPASS GRAFT  06/13/2012   Procedure: CORONARY ARTERY BYPASS GRAFTING (CABG);  Surgeon: Grace Isaac, MD;  Location: [place];  Service: Open Heart Surgery;  Laterality: N/A;  cabg x four;  using left internal mammary artery, and left leg greater saphenous vein harvested endoscopically  . CORONARY STENT INTERVENTION N/A 10/13/2016   Procedure: Coronary Stent Intervention;  Surgeon: Troy Sine, MD;  Location: [place] [facility];  Service: Cardiovascular;  Laterality: N/A;  . DIALYSIS/PERMA CATHETER REMOVAL N/A 04/18/2017   Procedure: DIALYSIS/PERMA CATHETER REMOVAL;  Surgeon: Katha Cabal, MD;  Location: [place] [facility];  Service: Cardiovascular;  Laterality: N/A;  . DILATION  AND CURETTAGE OF UTERUS    . ESOPHAGOGASTRODUODENOSCOPY  01/20/2012   Procedure: ESOPHAGOGASTRODUODENOSCOPY (EGD);  Surgeon: Ladene Artist, MD,FACG;  Location: [facility] ENDOSCOPY;  Service: Endoscopy;  Laterality: N/A;  . ESOPHAGOGASTRODUODENOSCOPY N/A 03/26/2013   Procedure: ESOPHAGOGASTRODUODENOSCOPY (EGD);  Surgeon: Irene Shipper, MD;  Location: [facility] ENDOSCOPY;  Service: Endoscopy;  Laterality: N/A;  . ESOPHAGOGASTRODUODENOSCOPY N/A 04/30/2015   Procedure: ESOPHAGOGASTRODUODENOSCOPY (EGD);  Surgeon: Rogene Houston, MD;  [facility];  Service: Endoscopy;  Laterality: N/A;  1pm - moved to 10/20 @ 1:10  . ESOPHAGOGASTRODUODENOSCOPY N/A 07/29/2016   Procedure: ESOPHAGOGASTRODUODENOSCOPY (EGD);  Surgeon: Manus Gunning, MD;  Location: [place];  Service: Gastroenterology;  Laterality: N/A;  enteroscopy  . ESOPHAGOGASTRODUODENOSCOPY N/A 09/26/2019   Procedure: ESOPHAGOGASTRODUODENOSCOPY (EGD);  Surgeon: Rogene Houston, MD;  [facility];  Service: Endoscopy;   Laterality: N/A;  1250  . GIVENS CAPSULE STUDY N/A 03/07/2019   Procedure: GIVENS CAPSULE STUDY;  Surgeon: Rogene Houston, MD;  [facility];  Service: Endoscopy;  Laterality: N/A;  7:30  . INTRAOPERATIVE TRANSESOPHAGEAL ECHOCARDIOGRAM  06/13/2012   Procedure: INTRAOPERATIVE TRANSESOPHAGEAL ECHOCARDIOGRAM;  Surgeon: Grace Isaac, MD;  Location: [place];  Service: Open Heart Surgery;  Laterality: N/A;  . IR FLUORO GUIDE CV LINE RIGHT  06/17/2020  . IR GENERIC HISTORICAL  07/26/2016   IR FLUORO GUIDE CV LINE RIGHT 07/26/2016 Greggory Keen, MD [facility]  . IR GENERIC HISTORICAL  07/26/2016   IR US GUIDE VASC ACCESS RIGHT 07/26/2016 Greggory Keen, MD [facility]  . IR GENERIC HISTORICAL  08/02/2016   IR US GUIDE VASC ACCESS RIGHT 08/02/2016 Greggory Keen, MD [facility]  . IR GENERIC HISTORICAL  08/02/2016   IR FLUORO GUIDE CV LINE RIGHT 08/02/2016 Greggory Keen, MD [facility]  . IR RADIOLOGY PERIPHERAL GUIDED IV START  03/28/2017  . IR REMOVAL TUN CV CATH W/O FL  08/11/2020  . IR THROMBECTOMY AV FISTULA W/THROMBOLYSIS INC/SHUNT/IMG LEFT Left 06/17/2020  . IR US GUIDE VASC ACCESS LEFT  06/17/2020  . IR US GUIDE VASC ACCESS RIGHT  03/28/2017  . IR US GUIDE VASC ACCESS RIGHT  06/17/2020  . LEFT HEART CATH AND CORONARY ANGIOGRAPHY N/A 09/20/2016   Procedure: Left Heart Cath and Coronary Angiography;  Surgeon: [name], MD;  Location: [place] [facility];  Service: Cardiovascular;  Laterality: N/A;  . LEFT HEART CATH AND CORS/GRAFTS ANGIOGRAPHY N/A 10/13/2016   Procedure: Left Heart Cath and Cors/Grafts Angiography;  Surgeon: Troy Sine, MD;  Location: [place] [facility];  Service: Cardiovascular;  Laterality: N/A;  . LEFT HEART CATH AND CORS/GRAFTS ANGIOGRAPHY N/A 07/13/2018   Procedure: LEFT HEART CATH AND CORS/GRAFTS ANGIOGRAPHY;  Surgeon: Martinique, [place] M, MD;  Location: [place] [facility];  Service: Cardiovascular;  Laterality: N/A;  . LEFT HEART CATHETERIZATION WITH CORONARY  ANGIOGRAM N/A 12/15/2011   Procedure: LEFT HEART CATHETERIZATION WITH CORONARY ANGIOGRAM;  Surgeon: Burnell Blanks, MD;  Location: [facility] CATH LAB;  Service: Cardiovascular;  Laterality: N/A;  . LEFT HEART CATHETERIZATION WITH CORONARY ANGIOGRAM N/A 01/10/2012   Procedure: LEFT HEART CATHETERIZATION WITH CORONARY ANGIOGRAM;  Surgeon: [place] M Martinique, MD;  Location: [facility] CATH LAB;  Service: Cardiovascular;  Laterality: N/A;  . LEFT HEART CATHETERIZATION WITH CORONARY ANGIOGRAM N/A 06/08/2012   Procedure: LEFT HEART CATHETERIZATION WITH CORONARY ANGIOGRAM;  Surgeon: Burnell Blanks, MD;  Location: [facility] CATH LAB;  Service: Cardiovascular;  Laterality: N/A;  . LEFT HEART CATHETERIZATION WITH CORONARY/GRAFT ANGIOGRAM N/A 12/10/2013  Procedure: LEFT HEART CATHETERIZATION WITH Beatrix Fetters;  Surgeon: [name], MD;  Location: [name] CATH LAB;  Service: Cardiovascular;  Laterality: N/A;  . OVARY SURGERY     ovarian cancer  . POLYPECTOMY  03/09/2019   Procedure: POLYPECTOMY;  Surgeon: Rogene Houston, MD;  [facility];  Service: Endoscopy;;  cecal   . POLYPECTOMY N/A 09/26/2019   Procedure: DUODENAL POLYPECTOMY;  Surgeon: Rogene Houston, MD;  [facility];  Service: Endoscopy;  Laterality: N/A;  . REVISION OF ARTERIOVENOUS GORETEX GRAFT N/A 02/24/2017   Procedure: REVISION OF ARTERIOVENOUS GORETEX GRAFT (RESECTION);  Surgeon: Katha Cabal, MD;  Location: [facility] ORS;  Service: Vascular;  Laterality: N/A;  . REVISON OF ARTERIOVENOUS FISTULA Left 06/19/2020   Procedure: REVISION OF LEFT UPPER ARM AV GRAFT WITH INTERPOSITION JUMP GRAFT USING 6MM GORE LIMB;  Surgeon: Marty Heck, MD;  Location: [place];  Service: Vascular;  Laterality: Left;  . SHUNTOGRAM N/A 10/15/2013   Procedure: Fistulogram;  Surgeon: Serafina Mitchell, MD;  Location: [facility] CATH LAB;  Service: Cardiovascular;  Laterality: N/A;  . THROMBECTOMY / ARTERIOVENOUS GRAFT REVISION  2011   left upper  arm  . TUBAL LIGATION  1980's  . UPPER EXTREMITY ANGIOGRAPHY Bilateral 12/06/2016   Procedure: Upper Extremity Angiography;  Surgeon: Katha Cabal, MD;  Location: [place] [facility];  Service: Cardiovascular;  Laterality: Bilateral;  . UPPER EXTREMITY INTERVENTION Left 06/06/2017   Procedure: UPPER EXTREMITY INTERVENTION;  Surgeon: Katha Cabal, MD;  Location: [place] [facility];  Service: Cardiovascular;  Laterality: Left;    Family History  Problem Relation Age of Onset  . Heart disease Mother        Heart Disease before age 65  . Hyperlipidemia Mother   . Hypertension Mother   . Diabetes Mother   . Heart attack Mother   . Heart disease Father        Heart Disease before age 47  . Hyperlipidemia Father   . Hypertension Father   . Diabetes Father   . Diabetes Sister   . Hypertension Sister   . Diabetes Brother   . Hyperlipidemia Brother   . Heart attack Brother   . Hypertension Sister   . Heart attack Brother   . Colon cancer Child 25  . Other Other        noncontributory for early CAD  . Esophageal cancer Neg Hx   . Liver disease Neg Hx   . Kidney disease Neg Hx   . Colon polyps Neg Hx     Social History:  reports that she has never smoked. She has never used smokeless tobacco. She reports that she does not drink alcohol and does not use drugs.  Allergies:  Allergies  Allergen Reactions  . Aspirin Other (See Comments)    High Doses Mess up her stomach; "makes my bowels have blood in them". Takes 81 mg EC Aspirin   . Penicillins Other (See Comments)    SYNCOPE? , "makes me real weak when I take it; like I'll pass out"  Has patient had a PCN reaction causing immediate rash, facial/tongue/throat swelling, SOB or lightheadedness with hypotension: Yes Has patient had a PCN reaction causing severe rash involving mucus membranes or skin necrosis: no Has patient had a PCN reaction that required hospitalization no Has patient had a PCN reaction occurring  within the last 10 years: no If all of the above  . Amlodipine Swelling  . Bactrim [Sulfamethoxazole-Trimethoprim] Rash  .  Contrast Media [Iodinated Diagnostic Agents] Itching  . Iron Itching and Other (See Comments)    "they gave me iron in dialysis; had to give me Benadryl cause I had to have the iron" (05/02/2012)  . Nitrofurantoin Hives  . Tylenol [Acetaminophen] Itching and Other (See Comments)    Makes her feet on fire per pt  . Gabapentin Other (See Comments)    Unknown reaction  . Hydralazine Itching  . Dexilant [Dexlansoprazole] Other (See Comments)    Upset stomach  . Levaquin [Levofloxacin In D5w] Rash  . Morphine And Related Itching and Other (See Comments)    Itching in feet  . Plavix [Clopidogrel Bisulfate] Rash  . Protonix [Pantoprazole Sodium] Rash  . Venofer [Ferric Oxide] Itching and Other (See Comments)    Patient reports using Benadryl prior to doses as [place]    Medications: Prior to Admission medications   Medication Sig Start Date End Date Taking? Authorizing Provider  albuterol (VENTOLIN HFA) 108 (90 Base) MCG/ACT inhaler Inhale 2 puffs into the lungs every 6 (six) hours as needed for wheezing or shortness of breath.   Yes [provider]  amiodarone (PACERONE) 200 MG tablet Take 1 tablet (200 mg total) by mouth daily. Starting July 2 12/17/19  Yes Strader, Fransisco Hertz, PA-C  aspirin EC 81 MG tablet Take 81 mg by mouth daily.  09/27/19  Yes Rehman, Mechele Dawley, MD  dicyclomine (BENTYL) 10 MG capsule Take 10 mg by mouth 2 (two) times daily.   Yes [provider]  fluconazole (DIFLUCAN) 100 MG tablet Take 100 mg by mouth daily. 08/17/20  Yes [provider]  fluticasone (FLONASE) 50 MCG/ACT nasal spray Place 1 spray into both nostrils at bedtime as needed for allergies.    Yes [provider]  gabapentin (NEURONTIN) 100 MG capsule Take 100 mg by mouth daily.  04/16/20  Yes [provider]  isosorbide mononitrate  (IMDUR) 60 MG 24 hr tablet Take 1 tablet (60 mg total) by mouth 2 (two) times daily. 08/10/20 11/08/20 Yes BranchAlphonse Guild, MD  lidocaine-prilocaine (EMLA) cream Apply 1 application topically every Monday, Wednesday, and Friday. Prior to dialysis   Yes [provider]  loratadine (CLARITIN) 10 MG tablet Take 1 tablet (10 mg total) by mouth daily as needed for allergies. 07/14/18  Yes Hosie Poisson, MD  multivitamin (RENA-VIT) TABS tablet Take 1 tablet by mouth daily.   Yes [provider]  nitroGLYCERIN (NITROSTAT) 0.4 MG SL tablet Place 1 tablet (0.4 mg total) under the tongue every 5 (five) minutes as needed for chest pain. 05/07/20  Yes Johnson, Clanford L, MD  omeprazole (PRILOSEC) 40 MG capsule Take 1 capsule (40 mg total) by mouth daily before breakfast. 03/31/20  Yes Laurine Blazer B, PA-C  ranolazine (RANEXA) 500 MG 12 hr tablet Take 1 tablet (500 mg total) by mouth 2 (two) times daily. 08/20/20  Yes Verta Ellen., NP  sevelamer carbonate (RENVELA) 800 MG tablet Take 2 tablets (1,600 mg total) by mouth 3 (three) times daily with meals. 06/20/20  Yes Meccariello, Bernita Raisin, DO  simvastatin (ZOCOR) 20 MG tablet Take 1 tablet (20 mg total) by mouth at bedtime. 07/07/20  Yes Verta Ellen., NP  cyclobenzaprine (FLEXERIL) 5 MG tablet Take 5 mg by mouth 3 (three) times daily as needed. Patient not taking: Reported on 08/30/2020 06/22/20   [provider]  ketoconazole (NIZORAL) 2 % cream Apply 1 application topically 3 (three) times daily. Patient not  taking: No sig reported 08/17/20   [provider]    Scheduled Meds: . amiodarone  200 mg Oral Daily  . aspirin EC  81 mg Oral Daily  . Chlorhexidine Gluconate Cloth  6 each Topical Daily  . heparin  5,000 Units Subcutaneous Q8H  . isosorbide mononitrate  60 mg Oral BID  . sevelamer carbonate  1,600 mg Oral TID WC  . simvastatin  20 mg Oral QHS   Continuous Infusions: . sodium chloride    . sodium  chloride     PRN Meds:.sodium chloride, sodium chloride, acetaminophen **OR** acetaminophen, albuterol, cyclobenzaprine, fluticasone, heparin, lidocaine-prilocaine, loratadine, ondansetron **OR** ondansetron (ZOFRAN) IV, pentafluoroprop-tetrafluoroeth, polyethylene glycol     Results for orders placed or performed during the hospital encounter of 08/30/20 (from the past 48 hour(s))  CBC with Differential     Status: Abnormal   Collection Time: 08/30/20 11:08 PM  Result Value Ref Range   WBC 5.9 4.0 - 10.5 K/uL   RBC 2.88 (L) 3.87 - 5.11 MIL/uL   Hemoglobin 10.5 (L) 12.0 - 15.0 g/dL   HCT 34.7 (L) 36.0 - 46.0 %   MCV 120.5 (H) 80.0 - 100.0 fL   MCH 36.5 (H) 26.0 - 34.0 pg   MCHC 30.3 30.0 - 36.0 g/dL   RDW 17.4 (H) 11.5 - 15.5 %   Platelets 207 150 - 400 K/uL   nRBC 0.0 0.0 - 0.2 %   Neutrophils Relative % 67 %   Neutro Abs 4.0 1.7 - 7.7 K/uL   Lymphocytes Relative 17 %   Lymphs Abs 1.0 0.7 - 4.0 K/uL   Monocytes Relative 8 %   Monocytes Absolute 0.5 0.1 - 1.0 K/uL   Eosinophils Relative 6 %   Eosinophils Absolute 0.3 0.0 - 0.5 K/uL   Basophils Relative 2 %   Basophils Absolute 0.1 0.0 - 0.1 K/uL   Immature Granulocytes 0 %   Abs Immature Granulocytes 0.02 0.00 - 0.07 K/uL    Comment: Performed at [facility], [address] [place], [place] [address]  Basic metabolic panel     Status: Abnormal   Collection Time: 08/30/20 11:08 PM  Result Value Ref Range   Sodium 136 135 - 145 mmol/L   Potassium 5.2 (H) 3.5 - 5.1 mmol/L   Chloride 96 (L) 98 - 111 mmol/L   CO2 25 22 - 32 mmol/L   Glucose, Bld 84 70 - 99 mg/dL    Comment: Glucose reference range applies only to samples taken after fasting for at least 8 hours.   BUN 49 (H) 8 - 23 mg/dL   Creatinine, Ser 8.68 (H) 0.44 - 1.00 mg/dL   Calcium 9.8 8.9 - 10.3 mg/dL   GFR, Estimated 4 (L) >60 mL/min    Comment: (NOTE) Calculated using the CKD-EPI Creatinine Equation (2021)    Anion gap 15 5 - 15    Comment: Performed at  [facility], [address], [place], [place] [address]  Magnesium     Status: None   Collection Time: 08/30/20 11:08 PM  Result Value Ref Range   Magnesium 1.9 1.7 - 2.4 mg/dL    Comment: Performed at [facility], [address] [place], [place] [address]  Hepatic function panel     Status: Abnormal   Collection Time: 08/30/20 11:08 PM  Result Value Ref Range   Total Protein 6.6 6.5 - 8.1 g/dL   Albumin 3.6 3.5 - 5.0 g/dL   AST 8 (L) 15 - 41 U/L  ALT 12 0 - 44 U/L   Alkaline Phosphatase 86 38 - 126 U/L   Total Bilirubin 0.8 0.3 - 1.2 mg/dL   Bilirubin, Direct <0.1 0.0 - 0.2 mg/dL   Indirect Bilirubin NOT CALCULATED 0.3 - 0.9 mg/dL    Comment: Performed at [facility], [address], [place], [place] [address]  SARS CORONAVIRUS 2 (TAT 6-24 HRS) Nasopharyngeal Nasopharyngeal Swab     Status: None   Collection Time: 08/31/20 12:39 AM   Specimen: Nasopharyngeal Swab  Result Value Ref Range   SARS Coronavirus 2 NEGATIVE NEGATIVE    Comment: (NOTE) SARS-CoV-2 target nucleic acids are NOT DETECTED.  The SARS-CoV-2 RNA is generally detectable in upper and lower respiratory specimens during the acute phase of infection. Negative results do not preclude SARS-CoV-2 infection, do not rule out co-infections with other pathogens, and should not be used as the sole basis for treatment or other patient management decisions. Negative results must be combined with clinical observations, patient history, and epidemiological information. The expected result is Negative.  Fact Sheet for Patients: SugarRoll.be  Fact Sheet for Healthcare Providers: https://www.woods-mathews.com/  This test is not yet approved or cleared by the [place] FDA and  has been authorized for detection and/or diagnosis of SARS-CoV-2 by FDA under an Emergency Use Authorization (EUA). This EUA will remain  in effect (meaning this test can be used) for the duration of  the COVID-19 declaration under Se ction 564(b)(1) of the Act, 21 U.S.C. section 360bbb-3(b)(1), unless the authorization is terminated or revoked sooner.  Performed at [facility], [place] [address], [place], [place] [address]   Comprehensive metabolic panel     Status: Abnormal   Collection Time: 08/31/20  6:30 AM  Result Value Ref Range   Sodium 138 135 - 145 mmol/L   Potassium 5.3 (H) 3.5 - 5.1 mmol/L   Chloride 97 (L) 98 - 111 mmol/L   CO2 25 22 - 32 mmol/L   Glucose, Bld 76 70 - 99 mg/dL    Comment: Glucose reference range applies only to samples taken after fasting for at least 8 hours.   BUN 54 (H) 8 - 23 mg/dL   Creatinine, Ser 9.83 (H) 0.44 - 1.00 mg/dL   Calcium 10.0 8.9 - 10.3 mg/dL   Total Protein 6.3 (L) 6.5 - 8.1 g/dL   Albumin 3.3 (L) 3.5 - 5.0 g/dL   AST 8 (L) 15 - 41 U/L   ALT 11 0 - 44 U/L   Alkaline Phosphatase 85 38 - 126 U/L   Total Bilirubin 0.7 0.3 - 1.2 mg/dL   GFR, Estimated 4 (L) >60 mL/min    Comment: (NOTE) Calculated using the CKD-EPI Creatinine Equation (2021)    Anion gap 16 (H) 5 - 15    Comment: Performed at [facility], [address], [place], [place] [address]  Magnesium     Status: None   Collection Time: 08/31/20  6:30 AM  Result Value Ref Range   Magnesium 1.8 1.7 - 2.4 mg/dL    Comment: Performed at [facility], [address] [place], [place] [address]  Phosphorus     Status: Abnormal   Collection Time: 08/31/20  6:30 AM  Result Value Ref Range   Phosphorus 6.0 (H) 2.5 - 4.6 mg/dL    Comment: Performed at [facility], [address] [place], [place] [address]  CBC WITH DIFFERENTIAL     Status: Abnormal   Collection Time: 08/31/20  6:30 AM  Result Value  Ref Range   WBC 5.8 4.0 - 10.5 K/uL   RBC 2.69 (L) 3.87 - 5.11 MIL/uL   Hemoglobin 9.9 (L) 12.0 - 15.0 g/dL   HCT 32.2 (L) 36.0 - 46.0 %   MCV 119.7 (H) 80.0 - 100.0 fL   MCH 36.8 (H) 26.0 - 34.0 pg   MCHC 30.7 30.0 - 36.0 g/dL   RDW 17.6 (H) 11.5 - 15.5 %   Platelets  191 150 - 400 K/uL    Comment: SPECIMEN CHECKED FOR CLOTS CONSISTENT WITH PREVIOUS RESULT    nRBC 0.0 0.0 - 0.2 %   Neutrophils Relative % 70 %   Neutro Abs 4.0 1.7 - 7.7 K/uL   Lymphocytes Relative 13 %   Lymphs Abs 0.8 0.7 - 4.0 K/uL   Monocytes Relative 9 %   Monocytes Absolute 0.5 0.1 - 1.0 K/uL   Eosinophils Relative 6 %   Eosinophils Absolute 0.4 0.0 - 0.5 K/uL   Basophils Relative 2 %   Basophils Absolute 0.1 0.0 - 0.1 K/uL   Immature Granulocytes 0 %   Abs Immature Granulocytes 0.01 0.00 - 0.07 K/uL    Comment: Performed at [facility], [address] [place], [place] [address]  Glucose, capillary     Status: Abnormal   Collection Time: 09/01/20 12:35 AM  Result Value Ref Range   Glucose-Capillary 103 (H) 70 - 99 mg/dL    Comment: Glucose reference range applies only to samples taken after fasting for at least 8 hours.   Comment 1 Notify RN    Comment 2 Document in Chart   Hepatitis B surface antigen     Status: None   Collection Time: 09/01/20  5:57 AM  Result Value Ref Range   Hepatitis B Surface Ag NON REACTIVE NON REACTIVE    Comment: Performed at [facility] [address], [place], [place] [address]  Renal function panel     Status: Abnormal   Collection Time: 09/01/20  5:57 AM  Result Value Ref Range   Sodium 133 (L) 135 - 145 mmol/L   Potassium 3.9 3.5 - 5.1 mmol/L    Comment: DELTA CHECK NOTED   Chloride 93 (L) 98 - 111 mmol/L   CO2 27 22 - 32 mmol/L   Glucose, Bld 170 (H) 70 - 99 mg/dL    Comment: Glucose reference range applies only to samples taken after fasting for at least 8 hours.   BUN 26 (H) 8 - 23 mg/dL   Creatinine, Ser 5.02 (H) 0.44 - 1.00 mg/dL   Calcium 10.1 8.9 - 10.3 mg/dL   Phosphorus 4.1 2.5 - 4.6 mg/dL   Albumin 3.8 3.5 - 5.0 g/dL   GFR, Estimated 8 (L) >60 mL/min    Comment: (NOTE) Calculated using the CKD-EPI Creatinine Equation (2021)    Anion gap 13 5 - 15    Comment: Performed at [facility], [address]  [place],  [address]  CBC     Status: Abnormal   Collection Time: 09/01/20  5:57 AM  Result Value Ref Range   WBC 8.3 4.0 - 10.5 K/uL   RBC 3.16 (L) 3.87 - 5.11 MIL/uL   Hemoglobin 11.3 (L) 12.0 - 15.0 g/dL   HCT 37.2 36.0 - 46.0 %   MCV 117.7 (H) 80.0 - 100.0 fL   MCH 35.8 (H) 26.0 - 34.0 pg   MCHC 30.4 30.0 - 36.0 g/dL   RDW 17.5 (H) 11.5 - 15.5 %   Platelets 225 150 -  400 K/uL   nRBC 0.0 0.0 - 0.2 %    Comment: Performed at [facility], [address] [place], [place] [address]   *Note: Due to a large number of results and/or encounters for the requested time period, some results have not been displayed. A complete set of results can be found in Results Review.    Studies/Results:  HEAD CT  FINDINGS: Brain: No evidence of acute infarction, hemorrhage, hydrocephalus, extra-axial collection, visible mass lesion or mass effect. Symmetric prominence of the ventricles, cisterns and sulci compatible with parenchymal volume loss. Patchy areas of white matter hypoattenuation are most compatible with chronic microvascular angiopathy.  Vascular: Atherosclerotic calcification of the carotid siphons and intradural vertebral arteries. No hyperdense vessel.  Skull: No significant scalp swelling or large hematoma. No calvarial fracture or acute osseous abnormalities.  Sinuses/Orbits: Paranasal sinuses and mastoid air cells are predominantly clear. Included orbital structures are unremarkable.  Other: Debris in the left external auditory canal.  IMPRESSION: 1. No acute intracranial abnormality. If there is persisting concern for acute infarction, MRI is more sensitive and specific for early features of ischemia. 2. Chronic microvascular angiopathy and parenchymal volume loss. Intracranial atherosclerosis. 3. Debris in the left external auditory canal, correlate for cerumen Impaction.    The brain CT scan is reviewed in person. No acute changes are noted. There is moderate  global atrophy appropriate for age. There is advance confluent leukoencephalopathy consistent with chronic microvascular changes. No encephalomalacia is noted.   Kofi A. Merlene [name], M.D.  Diplomate, Tax adviser of Psychiatry and Neurology ( Neurology). 09/01/2020, 5:32 PM

## 2020-09-01 NOTE — Progress Notes (Signed)
PROGRESS NOTE    Shawna [name]  FHL:[id number] DOB: 07-Jan-1941 DOA: 08/30/2020 PCP: [facility]   Chief Complaint  Patient presents with  . legs jerking    Brief admission Narrative:  As per H&P written by Dr. [name]  is a 80 y.o. female, with history of TIA, paroxysmal atrial fibrillation, ovarian cancer, myocardial infarction, hypertension, lower GI bleed, GERD, ESRD on hemodialysis last dialyzed 08/28/2020 due for dialysis today, diastolic CHF, and more presents to the ED with a chief complaint of jerking.  Patient reports that she has had uncontrolled jerking of her whole body that feels like spasms.  It started on 2/20 morning when she woke up.  She reports that the jerking caused her to fall down because she could not control her lower extremities.  Her husband reports that she was too weak to stand back up after she fell down.  She reports that the jerking is not painful.  There is no aura or warning symptoms that is about to happen.  After she reports it is never happened before, but then she reports that months ago she had a minor version of this happened.  She reports she has never had anything like this bad.  She reports that the myoclonic activity was present for the entire day.  She reports it is constant, but in the room its intermittent.  She reports she has had no fevers.  She has had no headache, change in vision, change in hearing associated with these myoclonic activities.  She reports her only new medication is Ranexa.  She is on gabapentin, but has had no dose change, or change to the way she takes it in months.    Assessment & Plan: 1-Myoclonus/jerking movement and metabolic encephalopathy -Slowly improving but no back to baseline yet -Appears to be associated with the use of Ranexa and gabapentin. -Continue holding medications -Continue to follow systemic clearance with further dialysis treatment -Neurology service has been consulted, will follow further  recommendations  2-history of coronary artery disease -Status post CABG -No chest pain, no shortness of breath. -As mentioned above holding Ranexa -Continue monitoring.  3-end-stage renal disease -Continue dialysis Monday Wednesday Friday -Nephrology service consulted will follow further recommendations.  4-hypertension -Stable and well-controlled -Continue current antihypertensive agents.  5-anemia of chronic kidney disease -Stable hemoglobin -No signs of overt bleeding -Follow trend -IV iron and Epogen as per nephrology discretion.  6-secondary hyperparathyroidism/hyperphosphatemia -Continue Renvela  7-physical deconditioning -Continue working with PT -Currently recommending SNF versus home with home health services -Continue to follow patient clinical progression.  DVT prophylaxis: heparin  Code Status: full code Family Communication: husband at bedside  Disposition:   Status is: Inpatient  Dispo: The patient is from: home               Anticipated d/c is to: to be determined.              Anticipated d/c date is: 1-2 days              Patient currently not medically stable for discharge; still feel weak intermittent myoclonus/jerking movement: Increased ability/weakness and deconditioning.  Patient also confused, disoriented (oriented to person only) and not at her baseline per family member.   Difficult to place patient no        Consultants:   Neurology service  Nephrology   Procedures:  See below for x-ray reports.   Antimicrobials:  None   Subjective: Confused, oriented to person only; her  husband reports moderate baseline.  Slow improvement in her jerking movement/myoclonic activity appreciated.  Patient is weak and deconditioned.  Objective: Vitals:   09/01/20 0055 09/01/20 0506 09/01/20 1105 09/01/20 1300  BP: (!) 179/78 (!) 189/73  (!) 165/81  Pulse: 89 92 79 87  Resp: 16 16  16   Temp: 98 F (36.7 C) 98.2 F (36.8 C)  98.7 F (37.1  C)  TempSrc:  Oral    SpO2: 99% 92% 93% 100%  Weight:  63.9 kg    Height:        Intake/Output Summary (Last 24 hours) at 09/01/2020 1736 Last data filed at 09/01/2020 0500 Gross per 24 hour  Intake 340 ml  Output 1456 ml  Net -1116 ml   Filed Weights   08/30/20 1614 08/31/20 1951 09/01/20 0506  Weight: 63.5 kg 65.6 kg 63.9 kg    Examination:  General exam: Appears calm and more interactive today; confused, oriented to person only and even improvement on myoclonic/jerking movement still appreciated. Respiratory system: Clear to auscultation. Respiratory effort normal.  No requiring oxygen supplementation.  No wheezing or crackles appreciated. Cardiovascular system: S1 & S2 heard, RRR. No JVD, rubs or gallops.  No lower extremity edema. Gastrointestinal system: Abdomen is nondistended, soft and nontender. No organomegaly or masses felt. Normal bowel sounds heard. Central nervous system: No focal neurological deficits. Extremities: No cyanosis or clubbing. Skin: No petechiae. Psychiatry: Mood & affect appropriate.     Data Reviewed: I have personally reviewed following labs and imaging studies  CBC: Recent Labs  Lab 08/30/20 2308 08/31/20 0630 09/01/20 0557  WBC 5.9 5.8 8.3  NEUTROABS 4.0 4.0  --   HGB 10.5* 9.9* 11.3*  HCT 34.7* 32.2* 37.2  MCV 120.5* 119.7* 117.7*  PLT 207 191 431    Basic Metabolic Panel: Recent Labs  Lab 08/30/20 2308 08/31/20 0630 09/01/20 0557  NA 136 138 133*  K 5.2* 5.3* 3.9  CL 96* 97* 93*  CO2 25 25 27   GLUCOSE 84 76 170*  BUN 49* 54* 26*  CREATININE 8.68* 9.83* 5.02*  CALCIUM 9.8 10.0 10.1  MG 1.9 1.8  --   PHOS  --  6.0* 4.1    GFR: Estimated Creatinine Clearance: 7.6 mL/min (A) (by C-G formula based on SCr of 5.02 mg/dL (H)).  Liver Function Tests: Recent Labs  Lab 08/30/20 2308 08/31/20 0630 09/01/20 0557  AST 8* 8*  --   ALT 12 11  --   ALKPHOS 86 85  --   BILITOT 0.8 0.7  --   PROT 6.6 6.3*  --   ALBUMIN 3.6  3.3* 3.8    CBG: Recent Labs  Lab 09/01/20 0035  GLUCAP 103*     Recent Results (from the past 240 hour(s))  SARS CORONAVIRUS 2 (TAT 6-24 HRS) Nasopharyngeal Nasopharyngeal Swab     Status: None   Collection Time: 08/31/20 12:39 AM   Specimen: Nasopharyngeal Swab  Result Value Ref Range Status   SARS Coronavirus 2 NEGATIVE NEGATIVE Final    Comment: (NOTE) SARS-CoV-2 target nucleic acids are NOT DETECTED.  The SARS-CoV-2 RNA is generally detectable in upper and lower respiratory specimens during the acute phase of infection. Negative results do not preclude SARS-CoV-2 infection, do not rule out co-infections with other pathogens, and should not be used as the sole basis for treatment or other patient management decisions. Negative results must be combined with clinical observations, patient history, and epidemiological information. The expected result is Negative.  Fact Sheet for  Patients: SugarRoll.be  Fact Sheet for Healthcare Providers: https://www.woods-mathews.com/  This test is not yet approved or cleared by the [place] FDA and  has been authorized for detection and/or diagnosis of SARS-CoV-2 by FDA under an Emergency Use Authorization (EUA). This EUA will remain  in effect (meaning this test can be used) for the duration of the COVID-19 declaration under Se ction 564(b)(1) of the Act, 21 U.S.C. section 360bbb-3(b)(1), unless the authorization is terminated or revoked sooner.  Performed at [facility], [place] [address] [place], [place] [address]     Radiology Studies: CT Head Wo Contrast  Result Date: 08/30/2020 CLINICAL DATA:  Neuro deficit, stroke suspected, jerking tremor with spasm since yesterday, fall today head strike on carpeted flooring EXAM: CT HEAD WITHOUT CONTRAST TECHNIQUE: Contiguous axial images were obtained from the base of the skull through the vertex without intravenous contrast. COMPARISON:   CT 01/31/2018 FINDINGS: Brain: No evidence of acute infarction, hemorrhage, hydrocephalus, extra-axial collection, visible mass lesion or mass effect. Symmetric prominence of the ventricles, cisterns and sulci compatible with parenchymal volume loss. Patchy areas of white matter hypoattenuation are most compatible with chronic microvascular angiopathy. Vascular: Atherosclerotic calcification of the carotid siphons and intradural vertebral arteries. No hyperdense vessel. Skull: No significant scalp swelling or large hematoma. No calvarial fracture or acute osseous abnormalities. Sinuses/Orbits: Paranasal sinuses and mastoid air cells are predominantly clear. Included orbital structures are unremarkable. Other: Debris in the left external auditory canal. IMPRESSION: 1. No acute intracranial abnormality. If there is persisting concern for acute infarction, MRI is more sensitive and specific for early features of ischemia. 2. Chronic microvascular angiopathy and parenchymal volume loss. Intracranial atherosclerosis. 3. Debris in the left external auditory canal, correlate for cerumen impaction. Electronically Signed   By: Lovena Le M.D.   On: 08/30/2020 22:57   DG Chest Port 1 View  Result Date: 08/30/2020 CLINICAL DATA:  Bilateral leg jerking and spasm since yesterday, fall today EXAM: PORTABLE CHEST 1 VIEW COMPARISON:  Radiograph 08/10/2020 FINDINGS: Some chronically coarsened interstitial and bronchitic changes similar to prior studies with some additional bandlike regions of scarring and or atelectasis in both lung bases. Stable prominent cardiomediastinal silhouette with postsurgical changes from prior sternotomy and CABG as well as coronary stenting. The aorta is calcified. The remaining cardiomediastinal contours are unremarkable. No acute osseous or soft tissue abnormality. Degenerative changes are present in the imaged spine and shoulders. Telemetry leads overlie the chest. Vascular stenting in the left  axilla. IMPRESSION: 1. No acute cardiopulmonary abnormality. Chronic findings as described above. 2. Stable postoperative changes from prior sternotomy, CABG and coronary stenting. Electronically Signed   By: Lovena Le M.D.   On: 08/30/2020 23:03    Scheduled Meds: . amiodarone  200 mg Oral Daily  . aspirin EC  81 mg Oral Daily  . Chlorhexidine Gluconate Cloth  6 each Topical Daily  . heparin  5,000 Units Subcutaneous Q8H  . isosorbide mononitrate  60 mg Oral BID  . sevelamer carbonate  1,600 mg Oral TID WC  . simvastatin  20 mg Oral QHS   Continuous Infusions: . sodium chloride    . sodium chloride       LOS: 0 days    Time spent: 30 minutes.    Barton Dubois, MD [facility]   To contact the attending provider between 7A-7P or the covering provider during after hours 7P-7A, please log into the web site [email or web address] and access using universal [employer] password for that web  site. If you do not have the password, please call the hospital operator.  09/01/2020, 5:36 PM

## 2020-09-01 NOTE — Plan of Care (Signed)
  Problem: Acute Rehab PT Goals(only PT should resolve) Goal: Pt Will Go Supine/Side To Sit Outcome: Progressing Flowsheets (Taken 09/01/2020 1603) Pt will go Supine/Side to Sit: with modified independence Goal: Pt Will Go Sit To Supine/Side Outcome: Progressing Flowsheets (Taken 09/01/2020 1603) Pt will go Sit to Supine/Side: with modified independence Goal: Patient Will Transfer Sit To/From Stand Outcome: Progressing Flowsheets (Taken 09/01/2020 1603) Patient will transfer sit to/from stand: with minimal assist Goal: Pt Will Transfer Bed To Chair/Chair To Bed Outcome: Progressing Flowsheets (Taken 09/01/2020 1603) Pt will Transfer Bed to Chair/Chair to Bed: with min assist Goal: Pt Will Ambulate Outcome: Progressing Flowsheets (Taken 09/01/2020 1603) Pt will Ambulate:  25 feet  with minimal assist  with rolling walker   Tori Melvin Marmo PT, DPT 09/01/20, 4:03 PM

## 2020-09-01 NOTE — Progress Notes (Addendum)
Admit: 08/30/2020 LOS: 0  80F ESRD with myoclonus and aphasia after ranolizine initiation  Subjective:  . HD yesterday evening, 1.4 L UF . Labs stable this morning . Afebrile, on room air, blood pressure is elevated . Patient more engaging and conversant but still some expressive speech difficulties, less myoclonus noted  02/21 0701 - 02/22 0700 In: 340 [P.O.:340] Out: [place] Weights   08/30/20 1614 08/31/20 1951 09/01/20 0506  Weight: 63.5 kg 65.6 kg 63.9 kg    Scheduled Meds: . amiodarone  200 mg Oral Daily  . aspirin EC  81 mg Oral Daily  . Chlorhexidine Gluconate Cloth  6 each Topical Daily  . heparin  5,000 Units Subcutaneous Q8H  . isosorbide mononitrate  60 mg Oral BID  . sevelamer carbonate  1,600 mg Oral TID WC  . simvastatin  20 mg Oral QHS   Continuous Infusions: . sodium chloride    . sodium chloride     PRN Meds:.sodium chloride, sodium chloride, acetaminophen **OR** acetaminophen, albuterol, cyclobenzaprine, fluticasone, heparin, lidocaine-prilocaine, loratadine, ondansetron **OR** ondansetron (ZOFRAN) IV, pentafluoroprop-tetrafluoroeth, polyethylene glycol  Current Labs: reviewed    Physical Exam:  Blood pressure (!) 189/73, pulse 92, temperature 98.2 F (36.8 C), temperature source Oral, resp. rate 16, height 5\' 1"  (1.549 m), weight 63.9 kg, SpO2 92 %. GEN:  Brighter affect, more engaging, still with some difficulty expressing her thoughts ENT: NCAT EYES: EOMI CV: Regular, normal S1 and S2 PULM: Clear bilaterally ABD: Soft, nontender SKIN: No rashes or lesions EXT: No peripheral edema VASCULAR: Left upper arm AV fistula with bruit and thrill Neuro: Less myoclonus noted  A 1. ESRD MWF LUE AVF [facility] 2. Myoclonus, improving? 3. Expressive aphasia, improving? 4. CAD/CABG with recent initiation of ranolazine 5. Diastolic heart failure 6. Hypertension 7. Anemia 8. 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 Continue  holding ranolazine and gabapentin . Cont HD on MWF schedule 3h, 3K, AVF, No heparin, 1L UF . Cont binders . Daily weights, Daily Renal Panel, Strict I/Os, Avoid nephrotoxins (NSAIDs, judicious IV Contrast)  . Medication Issues; o Preferred narcotic agents for pain control are hydromorphone, fentanyl, and methadone. Morphine should not be used.  o Baclofen should be avoided o Avoid oral sodium phosphate and magnesium citrate based laxatives / bowel preps    Pearson Grippe MD 09/01/2020, 10:12 AM  Recent Labs  Lab 08/30/20 2308 08/31/20 0630 09/01/20 0557  NA 136 138 133*  K 5.2* 5.3* 3.9  CL 96* 97* 93*  CO2 25 25 27   GLUCOSE 84 76 170*  BUN 49* 54* 26*  CREATININE 8.68* 9.83* 5.02*  CALCIUM 9.8 10.0 10.1  PHOS  --  6.0* 4.1   Recent Labs  Lab 08/30/20 2308 08/31/20 0630 09/01/20 0557  WBC 5.9 5.8 8.3  NEUTROABS 4.0 4.0  --   HGB 10.5* 9.9* 11.3*  HCT 34.7* 32.2* 37.2  MCV 120.5* 119.7* 117.7*  PLT 207 191 225

## 2020-09-01 NOTE — Evaluation (Addendum)
Physical Therapy Evaluation Patient Details Name: Shawna [name] MRN: [id number] DOB: 01/25/1941 Today's Date: 09/01/2020   History of Present Illness  Loveah Like  is a 80 y.o. female, with history of TIA, paroxysmal afib, ovarian cancer, MI, TIA, HTN, lower GI bleed, ESRD on hemodialysis last dialyzed 08/28/2020, CHF, and more presents with c/o whole body jerking that feels like spasms.  Clinical Impression  Pt admitted with above diagnosis. Pt without jerking noted in supine, able to come to sitting EOB with controlled movement of all extremities and undergo LE MMT without jerking noted. Pt with symmetrical heel-shin test, minimal deficits due to jerking. Upon standing, pt with bil knee buckling noted in static stance, cued for quad activation with little improvement, takes 3 sidesteps up to [facility] with assist to maneuver RW safely, BLE braced against bed and mod-max A. Pt appears slightly confused at times, requires repeating of cues and occasional tactile cues for mobility. Pt assisted back to supine without jerking motions noted. Pt c/o pain in 2nd/3rd toe and at base of toes possible pinpoint wound noted, difficult to assess due to skin peeling on plantar surface- RN notified. Currently recommending SNF due to assist level and high fall risk, may be able to return home pending improvement and assistance at home. Pt currently with functional limitations due to the deficits listed below (see PT Problem List). Pt will benefit from skilled PT to increase their independence and safety with mobility to allow discharge to the venue listed below.       Follow Up Recommendations SNF (possibly HHPT pending assistance at home)    Equipment Recommendations  None recommended by PT    Recommendations for Other Services       Precautions / Restrictions Precautions Precautions: Fall Precaution Comments: bil knee buckling/LE jerking Restrictions Weight Bearing Restrictions: No      Mobility  Bed  Mobility Overal bed mobility: Needs Assistance Bed Mobility: Supine to Sit;Sit to Supine  Supine to sit: Supervision Sit to supine: Supervision   General bed mobility comments: increased time, use of bedrail to upright trunk and scoot out to EOB    Transfers Overall transfer level: Needs assistance Equipment used: Rolling walker (2 wheeled) Transfers: Sit to/from Stand Sit to Stand: Mod assist  General transfer comment: BLE braced against bed, BUE assisting to power up, mod A to lift BLE, bil knee buckling noted in static standing and increased jerking motion without control  Ambulation/Gait Ambulation/Gait assistance: Mod assist;Max assist   Assistive device: Rolling walker (2 wheeled)  General Gait Details: pt takes 3 sidesteps up to [facility] with bil knee buckling noted and increased jerking motion without control, cues and physical assist for RW management, unable to take steps away from bed due to jerking and knee buckling  Stairs            Wheelchair Mobility    Modified Rankin (Stroke Patients Only)       Balance Overall balance assessment: Needs assistance Sitting-balance support: Feet supported;No upper extremity supported Sitting balance-Leahy Scale: Fair Sitting balance - Comments: seated EOB   Standing balance support: During functional activity;Bilateral upper extremity supported Standing balance-Leahy Scale: Zero Standing balance comment: mod/max A with BLE braced on front of bed and UE reliant on RW        Pertinent Vitals/Pain Pain Assessment: No/denies pain    Home Living Family/patient expects to be discharged to:: Private residence Living Arrangements: Spouse/significant other Available Help at Discharge: Family;Available PRN/intermittently Type of Home: [place]  Access: Stairs to enter Entrance Stairs-Rails: Right Entrance Stairs-Number of Steps: 1 Home Layout: One level Home Equipment: Cane - single point;Walker - 4 wheels      Prior  Function Level of Independence: Needs assistance   Gait / Transfers Assistance Needed: pt reports spouse assists with tub transfer, using SPC in home and community  ADL's / Homemaking Assistance Needed: pt reports spouse completes cooking, driving, both clean, occaisonal assist with dressing        Hand Dominance        Extremity/Trunk Assessment   Upper Extremity Assessment Upper Extremity Assessment: Overall WFL for tasks assessed    Lower Extremity Assessment Lower Extremity Assessment:  (heel-shin test minimally affected due to tremors, symmetrical) RLE Deficits / Details: AROM WNL, strength 4/5 throughout RLE Sensation: WNL RLE Coordination: WNL LLE Deficits / Details: AROM WNL, strength 4/5 throughout LLE Sensation: WNL LLE Coordination: WNL    Cervical / Trunk Assessment Cervical / Trunk Assessment: Kyphotic (Trunk sidebent R)  Communication   Communication: No difficulties  Cognition Arousal/Alertness: Awake/alert Behavior During Therapy: WFL for tasks assessed/performed Overall Cognitive Status: No family/caregiver present to determine baseline cognitive functioning  General Comments: Pt appears confused at times, oriented to self, states date is "November 2022", "I don't remember why I am here". With cues, pt able to remember her family brought her to the hospital due to "spasms". Requires increased time to follow one step commands and occasional repeating cue, appears confused at times with verbal commands.      General Comments      Exercises     Assessment/Plan    PT Assessment Patient needs continued PT services  PT Problem List Decreased strength;Decreased range of motion;Decreased activity tolerance;Decreased balance;Decreased knowledge of use of DME;Decreased safety awareness;Pain       PT Treatment Interventions DME instruction;Gait training;Functional mobility training;Therapeutic activities;Therapeutic exercise;Balance training;Neuromuscular  re-education;Patient/family education    PT Goals (Current goals can be found in the Care Plan section)  Acute Rehab PT Goals Patient Stated Goal: "stop jerking" PT Goal Formulation: With patient Time For Goal Achievement: 09/15/20 Potential to Achieve Goals: Good    Frequency Min 3X/week   Barriers to discharge        Co-evaluation               AM-PAC PT "6 Clicks" Mobility  Outcome Measure Help needed turning from your back to your side while in a flat bed without using bedrails?: A Little Help needed moving from lying on your back to sitting on the side of a flat bed without using bedrails?: A Little Help needed moving to and from a bed to a chair (including a wheelchair)?: A Lot Help needed standing up from a chair using your arms (e.g., wheelchair or bedside chair)?: A Lot Help needed to walk in hospital room?: A Lot Help needed climbing 3-5 steps with a railing? : Total 6 Click Score: 13    End of Session Equipment Utilized During Treatment: Gait belt Activity Tolerance: Treatment limited secondary to medical complications (Comment) Patient left: in bed;with call bell/phone within reach;with bed alarm set Nurse Communication: Mobility status PT Visit Diagnosis: Unsteadiness on feet (R26.81);Other abnormalities of gait and mobility (R26.89);Other symptoms and signs involving the nervous system (G64.403)    Time: 4742-5956 PT Time Calculation (min) (ACUTE ONLY): 15 min   Charges:   PT Evaluation $PT Eval Moderate Complexity: 1 Mod           Tori Laiken Sandy PT, DPT 09/01/20,  3:55 PM

## 2020-09-02 DIAGNOSIS — G253 Myoclonus: Principal | ICD-10-CM

## 2020-09-02 NOTE — Procedures (Signed)
I was present at this dialysis session. I have reviewed the session itself and made appropriate changes.  She is markedly improved and is back to her baseline.  Tolerating dialysis well.  Hopeful discharge soon.    Vital signs in last 24 hours:  Temp:  [98.3 F (36.8 C)-99.1 F (37.3 C)] 98.7 F (37.1 C) (02/23 1000) Pulse Rate:  [76-88] 84 (02/23 1130) Resp:  [16-20] 18 (02/23 1130) BP: (120-192)/(45-81) 120/60 (02/23 1130) SpO2:  [94 %-100 %] 95 % (02/23 1000) Weight:  [64.7 kg-65.2 kg] 64.7 kg (02/23 1000) Weight change: -0.39 kg Filed Weights   09/01/20 0506 09/02/20 0700 09/02/20 1000  Weight: 63.9 kg 65.2 kg 64.7 kg    Recent Labs  Lab 09/01/20 0557  NA 133*  K 3.9  CL 93*  CO2 27  GLUCOSE 170*  BUN 26*  CREATININE 5.02*  CALCIUM 10.1  PHOS 4.1    Recent Labs  Lab 08/30/20 2308 08/31/20 0630 09/01/20 0557  WBC 5.9 5.8 8.3  NEUTROABS 4.0 4.0  --   HGB 10.5* 9.9* 11.3*  HCT 34.7* 32.2* 37.2  MCV 120.5* 119.7* 117.7*  PLT 207 191 225    Scheduled Meds: . amiodarone  200 mg Oral Daily  . aspirin EC  81 mg Oral Daily  . Chlorhexidine Gluconate Cloth  6 each Topical Daily  . heparin  5,000 Units Subcutaneous Q8H  . isosorbide mononitrate  60 mg Oral BID  . sevelamer carbonate  1,600 mg Oral TID WC  . simvastatin  20 mg Oral QHS   Continuous Infusions: . sodium chloride    . sodium chloride     PRN Meds:.sodium chloride, sodium chloride, acetaminophen **OR** acetaminophen, albuterol, cyclobenzaprine, fluticasone, heparin, lidocaine-prilocaine, loratadine, ondansetron **OR** ondansetron (ZOFRAN) IV, pentafluoroprop-tetrafluoroeth, polyethylene glycol   Donetta Potts,  MD 09/02/2020, 12:00 PM

## 2020-09-02 NOTE — TOC Transition Note (Signed)
Transition of Care [facility]) - CM/SW Discharge Note   Patient Details  Name: Shawna [name] MRN: [id number] Date of Birth: [birth year]  Transition of [facility]) CM/SW Contact:  Salome Arnt, [place] Phone Number: 09/02/2020, 12:23 PM   Clinical Narrative:  LCSW received call from [facility] stating that they referred HHPT to [place] due to pt's address. LCSW spoke with Baker Janus at [facility] who confirms referral received and they can accept. Start of care may be as late as Monday. MD notified as well as pt. No other needs reported.      Final next level of care: [place] Barriers to Discharge: Barriers Resolved   Patient Goals and CMS Choice Patient states their goals for this hospitalization and ongoing recovery are:: return home   Choice offered to / list presented to : Patient  Discharge Placement                       Discharge Plan and Services In-house Referral: Clinical Social Work   Post Acute Care Choice: Home Health          DME Arranged: N/A         HH Arranged: PT [place] Agency: [place], [place]) Date [place]: 09/02/20 Time [place]: 1222 Representative spoke with at [place]: [place] ([place]) Interventions     Readmission Risk Interventions Readmission Risk Prevention Plan 09/02/2020 06/15/2020 06/18/2019  Transportation Screening Complete Complete Complete  PCP or Specialist Appt within 3-5 Days - - -  HRI or [employer] for [place] - - -  Medication Review Press photographer) Complete Complete Complete  PCP or Specialist appointment within 3-5 days of discharge - Complete -  [place] or [place] Complete Complete -  SW Recovery Care/Counseling Consult Complete Complete Complete  Palliative Care Screening Not Applicable Not Applicable Not [place]  Patient Refused Not Applicable Not Applicable  Some recent data might be hidden

## 2020-09-02 NOTE — Care Management Note (Signed)
Case Management Note  Patient Details  Name: Shawna [name] MRN: [id number] Date of Birth: 02/03/1941   The Appropriate Use Committee has met to discuss this patient's plan of care to review recommendations for post-acute treatment options.  The options were reviewed with the attending MD, TOC, rehab services and the physician advisor.  All available documentation has been reviewed and the determination has been made that this patient does not meet criteria for placement in a [place] for short-term rehab. A consult to the Transitions of Care Team has been made to discuss alternative options and discharge planning with the patient/family.

## 2020-09-02 NOTE — Progress Notes (Signed)
Discharge instructions reviewed with patient and patient's husband. Both verbalized understanding of discharge instructions.  Patient discharged home with husband in stable condition.

## 2020-09-02 NOTE — TOC Initial Note (Addendum)
Transition of Care [facility]) - Initial/Assessment Note    Patient Details  Name: Shawna [name] MRN: [id number] Date of Birth: 12/23/1940  Transition of Care [facility]) CM/SW Contact:    Salome Arnt, LCSW Phone Number: 09/02/2020, 11:00 AM  Clinical Narrative: Pt admitted with myoclonus. Assessment completed due to high risk readmission score. Pt states she lives with her husband who is with her around the clock and assists with all ADLs. Pt receives dialysis at [facility] on MWF schedule. PT evaluated pt and recommend SNF vs HH. Pt refusing placement and wants to return home. She requests HHPT. Pt lives in [place]. Discussed referral to [facility] and pt is agreeable. Referral made.                    Expected Discharge Plan: [place] Barriers to Discharge: Barriers Resolved   Patient Goals and CMS Choice Patient states their goals for this hospitalization and ongoing recovery are:: return home   Choice offered to / list presented to : Patient  Expected Discharge Plan and Services Expected Discharge Plan: [place] In-house Referral: Clinical Social Work   Post Acute Care Choice: [place] arrangements for the past 2 months: [place]                 DME Arranged: N/A         HH Arranged: PT [place] Agency: [place] Date [place]: 09/02/20 Time HH Agency Contacted: 1100 Representative spoke with at [place]: [place] Arrangements/Services Living arrangements for the past 2 months: [place] with:: Spouse Patient language and need for interpreter reviewed:: Yes Do you feel safe going back to the place where you live?: Yes      Need for Family Participation in Patient Care: Yes (Comment) Care giver support system in place?: Yes (comment) Current home services: DME (walker) Criminal Activity/Legal Involvement Pertinent to Current  Situation/Hospitalization: No - Comment as needed  Activities of Daily Living Home Assistive Devices/Equipment: None ADL Screening (condition at time of admission) Patient's cognitive ability adequate to safely complete daily activities?: Yes Is the patient deaf or have difficulty hearing?: No Does the patient have difficulty seeing, even when wearing glasses/contacts?: No Does the patient have difficulty concentrating, remembering, or making decisions?: No Patient able to express need for assistance with ADLs?: Yes Does the patient have difficulty dressing or bathing?: No Independently performs ADLs?: Yes (appropriate for developmental age) Does the patient have difficulty walking or climbing stairs?: No Weakness of Legs: None Weakness of Arms/Hands: None  Permission Sought/Granted                  Emotional Assessment   Attitude/Demeanor/Rapport: Engaged Affect (typically observed): Accepting Orientation: : Oriented to Self,Oriented to Place,Oriented to  Time,Oriented to Situation Alcohol / Substance Use: Not Applicable Psych Involvement: No (comment)  Admission diagnosis:  Myoclonus [G25.3] Tremor [R25.1] ESRD on dialysis ([place]) [N18.6, Z99.2] Jerking movements of extremities [R25.2] Patient Active Problem List   Diagnosis Date Noted  . Myoclonus 08/31/2020  . Clotted renal dialysis AV graft, initial encounter ([place])   . Hypertensive heart and chronic kidney disease with heart failure and stage 1 through stage 4 chronic kidney disease, or chronic kidney disease ([place])   . Hemodialysis-associated hypotension   . Acute hypoxemic respiratory failure ([place]) 06/14/2020  . Hypertension 06/12/2020  . Irritable bowel syndrome 02/25/2020  . Pulmonary edema 10/14/2019  .  Adenomatous duodenal polyp 09/10/2019  . History of GI bleed 09/10/2019  . Angina pectoris ([place]) 06/05/2019  . Chest pain 06/03/2019  . Rectal bleeding 05/14/2019  . Small intestinal bacterial overgrowth  05/14/2019  . Iron deficiency anemia 04/02/2019  . GI bleed 03/08/2019  . Gastrointestinal hemorrhage with melena 03/06/2019  . Acute respiratory failure with hypoxia ([place]) 12/25/2018  . Elevated troponin 12/14/2018  . Chest pain at rest 07/13/2018  . Hand steal syndrome ([place]) 08/01/2017  . Macrocytic anemia 07/14/2017  . Coronary artery disease 06/05/2017  . Mesenteric ischemia ([place])   . Diverticulitis   . Enteritis   . Complication of vascular access for dialysis 03/19/2017  . Preoperative clearance 01/25/2017  . Symptomatic anemia 10/24/2016  . H/O non-ST elevation myocardial infarction (NSTEMI) 10/24/2016  . Fluid overload 10/10/2016  . Complication from renal dialysis device 10/10/2016  . Non-ST elevation MI (NSTEMI) ([place])   . Encounter for fitting and adjustment of vascular catheter   . Heme positive stool   . Demand ischemia ([place]) 07/27/2016  . Hypertensive emergency 07/08/2016  . Acute on chronic respiratory failure with hypoxia ([place])   . Cardiac arrest ([place])   . Palliative care encounter   . Goals of care, counseling/discussion   . Hypertensive crisis without congestive heart failure 05/09/2016  . Acute pulmonary edema ([place]) 04/06/2016  . Acute respiratory failure ([place]) 04/06/2016  . Hypertensive crisis 01/27/2016  . History of colon cancer 01/27/2016  . History of ovarian cancer 01/27/2016  . Hypertensive urgency 01/27/2016  . Paroxysmal atrial fibrillation ([place]) 10/14/2015  . Coronary angioplasty status 10/14/2015  . Malignant neoplasm of right ovary ([place]) 10/14/2015  . Narrow complex tachycardia ([place]) 09/08/2015  . SVT (supraventricular tachycardia) ([place]) 09/08/2015  . Influenza A 08/30/2015  . Acute on chronic diastolic CHF (congestive heart failure) ([place]) 05/04/2015  . Unstable angina ([place]) 05/03/2015  . DOE (dyspnea on exertion)   . Essential hypertension   . Pain in joint, lower leg 08/14/2014  . Dacryocystitis 05/29/2013  . Chronic diastolic CHF  (congestive heart failure) ([place]) 03/22/2013  . GI bleeding 03/21/2013  . Acute blood loss anemia 03/21/2013  . Vaginal odor 03/12/2013  . Vaginal discharge 03/12/2013  . Occlusion and stenosis of carotid artery without mention of cerebral infarction 01/24/2013  . Hx of CABG 07/05/2012  . Carotid artery disease ([place]) 07/05/2012  . Mitral regurgitation 06/12/2012  . Pneumonia 06/09/2012  . Non-STEMI (non-ST elevated myocardial infarction) ([place]) 06/08/2012  . Ischemic chest pain ([place]) 03/01/2012  . 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 GERD (gastroesophageal reflux disease) 01/09/2012  . HLD (hyperlipidemia) 01/05/2012  . Atherosclerotic heart disease of native coronary artery without angina pectoris 12/16/2011  . Essential hypertension, benign 12/16/2011  . ESRD on hemodialysis ([place]) 12/16/2011  . Anxiety disorder 05/04/2011  . Anemia in chronic kidney disease 04/29/2011  . Secondary hyperparathyroidism of renal origin ([place]) 04/29/2011  . ESRD (end stage renal disease) on dialysis ([place]) 04/29/2011  . Gout 04/29/2011  . Hypertensive chronic kidney disease with stage 5 chronic kidney disease or end stage renal disease ([place]) 04/29/2011   PCP:  Practice, [place]:   [facility] [address], [place] [address] Phone: [phone] Fax: [phone]  [facility] (ESRD Bundle Only) - [place], [address] [place] [address] Phone: [phone] Fax: [phone]     Social Determinants of Health (SDOH) Interventions  Readmission Risk Interventions Readmission Risk Prevention Plan 09/02/2020 06/15/2020 06/18/2019  Transportation Screening Complete Complete Complete  PCP or Specialist Appt within 3-5 Days - - -  HRI or [employer] for [place] - - -  Medication  Review Press photographer) Complete Complete Complete  PCP or Specialist appointment within 3-5 days of discharge - Complete -  [place] or [place] Complete Complete -  SW Recovery Care/Counseling Consult Complete Complete Complete  Palliative Care Screening Not Applicable Not Applicable Not [place] Patient Refused Not Applicable Not Applicable  Some recent data might be hidden

## 2020-09-02 NOTE — Discharge Summary (Signed)
Physician Discharge Summary  Shawna [name] ZES:[id number] DOB: 03-24-1941 DOA: 08/30/2020  PCP: [facility]  Admit date: 08/30/2020  Discharge date: 09/02/2020  Admitted From:Home  Disposition:  Home  Recommendations for Outpatient Follow-up:  1. Follow up with PCP in 1-2 weeks 2. Please obtain BMP/CBC in one week 3. Remain off of Ranexa and gabapentin as recommended, follow-up with specialist for consideration of other agents to use  Home Health: Yes with PT  Equipment/Devices: None  Discharge Condition:Stable  CODE STATUS: Full  Diet recommendation: Heart Healthy  Brief/Interim Summary: Per HPI: [name] a79 y.o.female,with history of TIA, paroxysmal atrial fibrillation, ovarian cancer, myocardial infarction, hypertension, lower GI bleed, GERD, ESRD on hemodialysis last dialyzed 08/28/2020 due for dialysis today, diastolic CHF, and more presents to the ED with a chief complaint of jerking. Patient reports that she has had uncontrolled jerking of her whole body that feels like spasms. It started on 2/40morning when she woke up. She reports that the jerking caused her to fall down because she could not control her lower extremities. Her husband reports that she was too weak to stand back up after she fell down. She reports that the jerking is not painful. There is no aura or warning symptoms that is about to happen. After she reports it is never happened before, but then she reports that months ago she had a minor version of this happened. She reports she has never had anything like this bad. She reports that the myoclonic activity was present for the entire day. She reports it is constant, but in the room its intermittent. She reports she has had no fevers. She has had no headache, change in vision, change in hearing associated with these myoclonic activities. She reports her only new medication is Ranexa. She is on gabapentin, but has had no dose  change, or change to the way she takes it in months.   -Patient was admitted with myoclonus as well as some metabolic encephalopathy in the setting of Ranexa and gabapentin use with associated ESRD.  These 2 medications have been held after consultation with neurology and patient has had significant improvement in her symptoms and is back to her usual baseline.  She has been seen by PT with recommendation for SNF initially, however patient prefers to be at home with home PT and has full support with her husband at home.  She has had no other acute events during the course of this admission and is overall stable for discharge today.  Discharge Diagnoses:  Active Problems:   Myoclonus  Principal discharge diagnosis: Acute recurrent falls with myoclonus likely due to initiation of Ranexa in the setting of hemodialysis.  Discharge Instructions  Discharge Instructions    Diet - low sodium heart healthy   Complete by: As directed    Increase activity slowly   Complete by: As directed    No wound care   Complete by: As directed      Allergies as of 09/02/2020      Reactions   Aspirin Other (See Comments)   High Doses Mess up her stomach; "makes my bowels have blood in them". Takes 81 mg EC Aspirin    Penicillins Other (See Comments)   SYNCOPE? , "makes me real weak when I take it; like I'll pass out" Has patient had a PCN reaction causing immediate rash, facial/tongue/throat swelling, SOB or lightheadedness with hypotension: Yes Has patient had a PCN reaction causing severe rash involving mucus membranes or skin necrosis: no  Has patient had a PCN reaction that required hospitalization no Has patient had a PCN reaction occurring within the last 10 years: no If all of the above   Amlodipine Swelling   Bactrim [sulfamethoxazole-trimethoprim] Rash   Contrast Media [iodinated Diagnostic Agents] Itching   Iron Itching, Other (See Comments)   "they gave me iron in dialysis; had to give me  Benadryl cause I had to have the iron" (05/02/2012)   Nitrofurantoin Hives   Tylenol [acetaminophen] Itching, Other (See Comments)   Makes her feet on fire per pt   Gabapentin Other (See Comments)   Unknown reaction   Hydralazine Itching   Dexilant [dexlansoprazole] Other (See Comments)   Upset stomach   Levaquin [levofloxacin In D5w] Rash   Morphine And Related Itching, Other (See Comments)   Itching in feet   Plavix [clopidogrel Bisulfate] Rash   Protonix [pantoprazole Sodium] Rash   Venofer [ferric Oxide] Itching, Other (See Comments)   Patient reports using Benadryl prior to doses as [name]      Medication List    STOP taking these medications   cyclobenzaprine 5 MG tablet Commonly known as: FLEXERIL   gabapentin 100 MG capsule Commonly known as: NEURONTIN   ranolazine 500 MG 12 hr tablet Commonly known as: RANEXA     TAKE these medications   albuterol 108 (90 Base) MCG/ACT inhaler Commonly known as: VENTOLIN HFA Inhale 2 puffs into the lungs every 6 (six) hours as needed for wheezing or shortness of breath.   amiodarone 200 MG tablet Commonly known as: PACERONE Take 1 tablet (200 mg total) by mouth daily. Starting July 2   aspirin EC 81 MG tablet Take 81 mg by mouth daily.   dicyclomine 10 MG capsule Commonly known as: BENTYL Take 10 mg by mouth 2 (two) times daily.   fluconazole 100 MG tablet Commonly known as: DIFLUCAN Take 100 mg by mouth daily.   fluticasone 50 MCG/ACT nasal spray Commonly known as: FLONASE Place 1 spray into both nostrils at bedtime as needed for allergies.   isosorbide mononitrate 60 MG 24 hr tablet Commonly known as: IMDUR Take 1 tablet (60 mg total) by mouth 2 (two) times daily.   ketoconazole 2 % cream Commonly known as: NIZORAL Apply 1 application topically 3 (three) times daily.   lidocaine-prilocaine cream Commonly known as: EMLA Apply 1 application topically every Monday, Wednesday, and Friday. Prior to  dialysis   loratadine 10 MG tablet Commonly known as: CLARITIN Take 1 tablet (10 mg total) by mouth daily as needed for allergies.   multivitamin Tabs tablet Take 1 tablet by mouth daily.   nitroGLYCERIN 0.4 MG SL tablet Commonly known as: NITROSTAT Place 1 tablet (0.4 mg total) under the tongue every 5 (five) minutes as needed for chest pain.   omeprazole 40 MG capsule Commonly known as: PRILOSEC Take 1 capsule (40 mg total) by mouth daily before breakfast.   sevelamer carbonate 800 MG tablet Commonly known as: RENVELA Take 2 tablets (1,600 mg total) by mouth 3 (three) times daily with meals.   simvastatin 20 MG tablet Commonly known as: ZOCOR Take 1 tablet (20 mg total) by mouth at bedtime.       Follow-up Information    [name].   Contact information: [phone]       [facility] up in 1 week(s).   Contact information: [place] [address] [phone]  Allergies  Allergen Reactions  . Aspirin Other (See Comments)    High Doses Mess up her stomach; "makes my bowels have blood in them". Takes 81 mg EC Aspirin   . Penicillins Other (See Comments)    SYNCOPE? , "makes me real weak when I take it; like I'll pass out"  Has patient had a PCN reaction causing immediate rash, facial/tongue/throat swelling, SOB or lightheadedness with hypotension: Yes Has patient had a PCN reaction causing severe rash involving mucus membranes or skin necrosis: no Has patient had a PCN reaction that required hospitalization no Has patient had a PCN reaction occurring within the last 10 years: no If all of the above  . Amlodipine Swelling  . Bactrim [Sulfamethoxazole-Trimethoprim] Rash  . Contrast Media [Iodinated Diagnostic Agents] Itching  . Iron Itching and Other (See Comments)    "they gave me iron in dialysis; had to give me Benadryl cause I had to have the iron" (05/02/2012)  . Nitrofurantoin Hives  .  Tylenol [Acetaminophen] Itching and Other (See Comments)    Makes her feet on fire per pt  . Gabapentin Other (See Comments)    Unknown reaction  . Hydralazine Itching  . Dexilant [Dexlansoprazole] Other (See Comments)    Upset stomach  . Levaquin [Levofloxacin In D5w] Rash  . Morphine And Related Itching and Other (See Comments)    Itching in feet  . Plavix [Clopidogrel Bisulfate] Rash  . Protonix [Pantoprazole Sodium] Rash  . Venofer [Ferric Oxide] Itching and Other (See Comments)    Patient reports using Benadryl prior to doses as [place]    Consultations:  Nephrology  Neurology   Procedures/Studies: CT Head Wo Contrast  Result Date: 08/30/2020 CLINICAL DATA:  Neuro deficit, stroke suspected, jerking tremor with spasm since yesterday, fall today head strike on carpeted flooring EXAM: CT HEAD WITHOUT CONTRAST TECHNIQUE: Contiguous axial images were obtained from the base of the skull through the vertex without intravenous contrast. COMPARISON:  CT 01/31/2018 FINDINGS: Brain: No evidence of acute infarction, hemorrhage, hydrocephalus, extra-axial collection, visible mass lesion or mass effect. Symmetric prominence of the ventricles, cisterns and sulci compatible with parenchymal volume loss. Patchy areas of white matter hypoattenuation are most compatible with chronic microvascular angiopathy. Vascular: Atherosclerotic calcification of the carotid siphons and intradural vertebral arteries. No hyperdense vessel. Skull: No significant scalp swelling or large hematoma. No calvarial fracture or acute osseous abnormalities. Sinuses/Orbits: Paranasal sinuses and mastoid air cells are predominantly clear. Included orbital structures are unremarkable. Other: Debris in the left external auditory canal. IMPRESSION: 1. No acute intracranial abnormality. If there is persisting concern for acute infarction, MRI is more sensitive and specific for early features of ischemia. 2. Chronic  microvascular angiopathy and parenchymal volume loss. Intracranial atherosclerosis. 3. Debris in the left external auditory canal, correlate for cerumen impaction. Electronically Signed   By: Lovena Le M.D.   On: 08/30/2020 22:57   IR Removal Tun Cv Cath W/O FL  Result Date: 08/11/2020 INDICATION: Patient with history of ESRD on HD via right IJ tunneled HD catheter placed in IR 06/17/2020. Patient now with functioning AV graft for dialysis. Request is made for removal of tunneled HD catheter. EXAM: REMOVAL OF TUNNELED HEMODIALYSIS CATHETER MEDICATIONS: None COMPLICATIONS: None immediate. PROCEDURE: Informed written consent was obtained from the patient following an explanation of the procedure, risks, benefits and alternatives to treatment. A time out was performed prior to the initiation of the procedure. Maximal barrier sterile technique was utilized including caps, mask, sterile gowns,  sterile gloves, large sterile drape, and hand hygiene. Utilizing gentle traction, the catheter was removed intact. Hemostasis was obtained with manual compression. A dressing was placed. The patient tolerated the procedure well without immediate post procedural complication. IMPRESSION: Successful removal of tunneled dialysis catheter. Read by: Earley Abide, PA-C Electronically Signed   By: Jerilynn Mages.  Shick M.D.   On: 08/11/2020 14:24   DG Chest Port 1 View  Result Date: 08/30/2020 CLINICAL DATA:  Bilateral leg jerking and spasm since yesterday, fall today EXAM: PORTABLE CHEST 1 VIEW COMPARISON:  Radiograph 08/10/2020 FINDINGS: Some chronically coarsened interstitial and bronchitic changes similar to prior studies with some additional bandlike regions of scarring and or atelectasis in both lung bases. Stable prominent cardiomediastinal silhouette with postsurgical changes from prior sternotomy and CABG as well as coronary stenting. The aorta is calcified. The remaining cardiomediastinal contours are unremarkable. No acute  osseous or soft tissue abnormality. Degenerative changes are present in the imaged spine and shoulders. Telemetry leads overlie the chest. Vascular stenting in the left axilla. IMPRESSION: 1. No acute cardiopulmonary abnormality. Chronic findings as described above. 2. Stable postoperative changes from prior sternotomy, CABG and coronary stenting. Electronically Signed   By: Lovena Le M.D.   On: 08/30/2020 23:03   DG Chest Port 1 View  Result Date: 08/10/2020 CLINICAL DATA:  Chest pain EXAM: PORTABLE CHEST 1 VIEW COMPARISON:  06/13/2020 FINDINGS: Lungs are clear. No pneumothorax or pleural effusion. Coronary artery bypass grafting has been performed. Cardiac size within normal limits. Right internal jugular hemodialysis catheter tip within the right atrium. No acute bone abnormality. IMPRESSION: No active disease. Electronically Signed   By: Fidela Salisbury MD   On: 08/10/2020 16:02      Discharge Exam: Vitals:   09/02/20 1130 09/02/20 1200  BP: 120/60 110/60  Pulse: 84 80  Resp: 18 16  Temp:    SpO2:     Vitals:   09/02/20 1030 09/02/20 1100 09/02/20 1130 09/02/20 1200  BP: 132/64 120/62 120/60 110/60  Pulse: 88 84 84 80  Resp: 18 18 18 16   Temp:      TempSrc:      SpO2:      Weight:      Height:        General: Pt is alert, awake, not in acute distress Cardiovascular: RRR, S1/S2 +, no rubs, no gallops Respiratory: CTA bilaterally, no wheezing, no rhonchi Abdominal: Soft, NT, ND, bowel sounds + Extremities: no edema, no cyanosis    The results of significant diagnostics from this hospitalization (including imaging, microbiology, ancillary and laboratory) are listed below for reference.     Microbiology: Recent Results (from the past 240 hour(s))  SARS CORONAVIRUS 2 (TAT 6-24 HRS) Nasopharyngeal Nasopharyngeal Swab     Status: None   Collection Time: 08/31/20 12:39 AM   Specimen: Nasopharyngeal Swab  Result Value Ref Range Status   SARS Coronavirus 2 NEGATIVE NEGATIVE  Final    Comment: (NOTE) SARS-CoV-2 target nucleic acids are NOT DETECTED.  The SARS-CoV-2 RNA is generally detectable in upper and lower respiratory specimens during the acute phase of infection. Negative results do not preclude SARS-CoV-2 infection, do not rule out co-infections with other pathogens, and should not be used as the sole basis for treatment or other patient management decisions. Negative results must be combined with clinical observations, patient history, and epidemiological information. The expected result is Negative.  Fact Sheet for Patients: SugarRoll.be  Fact Sheet for Healthcare Providers: https://www.woods-mathews.com/  This test is not yet  approved or cleared by the [place] and  has been authorized for detection and/or diagnosis of SARS-CoV-2 by FDA under an Emergency Use Authorization (EUA). This EUA will remain  in effect (meaning this test can be used) for the duration of the COVID-19 declaration under Se ction 564(b)(1) of the Act, 21 U.S.C. section 360bbb-3(b)(1), unless the authorization is terminated or revoked sooner.  Performed at [facility], [place] [address] [place], [place] [address]      Labs: BNP (last 3 results) No results for input(s): BNP in the last 8760 hours. Basic Metabolic Panel: Recent Labs  Lab 08/30/20 2308 08/31/20 0630 09/01/20 0557  NA 136 138 133*  K 5.2* 5.3* 3.9  CL 96* 97* 93*  CO2 25 25 27   GLUCOSE 84 76 170*  BUN 49* 54* 26*  CREATININE 8.68* 9.83* 5.02*  CALCIUM 9.8 10.0 10.1  MG 1.9 1.8  --   PHOS  --  6.0* 4.1   Liver Function Tests: Recent Labs  Lab 08/30/20 2308 08/31/20 0630 09/01/20 0557  AST 8* 8*  --   ALT 12 11  --   ALKPHOS 86 85  --   BILITOT 0.8 0.7  --   PROT 6.6 6.3*  --   ALBUMIN 3.6 3.3* 3.8   No results for input(s): LIPASE, AMYLASE in the last 168 hours. No results for input(s): AMMONIA in the last 168  hours. CBC: Recent Labs  Lab 08/30/20 2308 08/31/20 0630 09/01/20 0557  WBC 5.9 5.8 8.3  NEUTROABS 4.0 4.0  --   HGB 10.5* 9.9* 11.3*  HCT 34.7* 32.2* 37.2  MCV 120.5* 119.7* 117.7*  PLT 207 191 225   Cardiac Enzymes: No results for input(s): CKTOTAL, CKMB, CKMBINDEX, TROPONINI in the last 168 hours. BNP: Invalid input(s): POCBNP CBG: Recent Labs  Lab 09/01/20 0035  GLUCAP 103*   D-Dimer No results for input(s): DDIMER in the last 72 hours. Hgb A1c No results for input(s): HGBA1C in the last 72 hours. Lipid Profile No results for input(s): CHOL, HDL, LDLCALC, TRIG, CHOLHDL, LDLDIRECT in the last 72 hours. Thyroid function studies No results for input(s): TSH, T4TOTAL, T3FREE, THYROIDAB in the last 72 hours.  Invalid input(s): FREET3 Anemia work up No results for input(s): VITAMINB12, FOLATE, FERRITIN, TIBC, IRON, RETICCTPCT in the last 72 hours. Urinalysis    Component Value Date/Time   COLORURINE YELLOW 12/16/2012 1919   APPEARANCEUR CLOUDY (A) 12/16/2012 1919   LABSPEC 1.009 12/16/2012 1919   PHURINE 7.5 12/16/2012 1919   GLUCOSEU NEGATIVE 12/16/2012 1919   HGBUR TRACE (A) 12/16/2012 1919   BILIRUBINUR NEGATIVE 12/16/2012 1919   KETONESUR NEGATIVE 12/16/2012 1919   PROTEINUR 100 (A) 12/16/2012 1919   UROBILINOGEN 0.2 12/16/2012 1919   NITRITE NEGATIVE 12/16/2012 1919   LEUKOCYTESUR SMALL (A) 12/16/2012 1919   Sepsis Labs Invalid input(s): PROCALCITONIN,  WBC,  LACTICIDVEN Microbiology Recent Results (from the past 240 hour(s))  SARS CORONAVIRUS 2 (TAT 6-24 HRS) Nasopharyngeal Nasopharyngeal Swab     Status: None   Collection Time: 08/31/20 12:39 AM   Specimen: Nasopharyngeal Swab  Result Value Ref Range Status   SARS Coronavirus 2 NEGATIVE NEGATIVE Final    Comment: (NOTE) SARS-CoV-2 target nucleic acids are NOT DETECTED.  The SARS-CoV-2 RNA is generally detectable in upper and lower respiratory specimens during the acute phase of infection.  Negative results do not preclude SARS-CoV-2 infection, do not rule out co-infections with other pathogens, and should not be used as the sole basis  for treatment or other patient management decisions. Negative results must be combined with clinical observations, patient history, and epidemiological information. The expected result is Negative.  Fact Sheet for Patients: SugarRoll.be  Fact Sheet for Healthcare Providers: https://www.woods-mathews.com/  This test is not yet approved or cleared by the [place] FDA and  has been authorized for detection and/or diagnosis of SARS-CoV-2 by FDA under an Emergency Use Authorization (EUA). This EUA will remain  in effect (meaning this test can be used) for the duration of the COVID-19 declaration under Se ction 564(b)(1) of the Act, 21 U.S.C. section 360bbb-3(b)(1), unless the authorization is terminated or revoked sooner.  Performed at [facility], [place] [address] [place], [place] [address]      Time coordinating discharge: 35 minutes  SIGNED:   Rodena Goldmann, DO [facility] 09/02/2020, 12:27 PM  If 7PM-7AM, please contact night-coverage [email or web address]

## 2020-09-08 ENCOUNTER — Other Ambulatory Visit: Payer: Self-pay | Admitting: *Deleted

## 2020-09-08 ENCOUNTER — Telehealth (INDEPENDENT_AMBULATORY_CARE_PROVIDER_SITE_OTHER): Payer: Self-pay | Admitting: Gastroenterology

## 2020-09-08 MED ORDER — NITROGLYCERIN 0.4 MG SL SUBL: 0.4000 mg | SUBLINGUAL_TABLET | SUBLINGUAL | 2 refills | Status: DC | PRN

## 2020-09-08 NOTE — Telephone Encounter (Signed)
Patient left voice mail message stating she has been in the hospital - would like a call back - ph# [phone]

## 2020-09-08 NOTE — Telephone Encounter (Signed)
Patient wanted to let you know that she went to [facility] on 08/30/2020-09/02/2020 due to her heart medication being too strong for her and it was causing Tremors, and says she could not walk.

## 2020-09-08 NOTE — Telephone Encounter (Signed)
Spoke with the patient regarding he recent hospitalization due to cardiac medication. I advised the patient to take the IBGard and follow the instructions provided when discharged from the hospital. Patient understood and agreed., she will follow in clinic.  [name], MD Gastroenterology and Hepatology [facility]

## 2020-09-10 ENCOUNTER — Telehealth: Payer: Self-pay | Admitting: Family Medicine

## 2020-09-10 NOTE — Telephone Encounter (Signed)
Okay thank you

## 2020-09-10 NOTE — Telephone Encounter (Signed)
Advised that message received regarding not being able to take ranexa.

## 2020-09-10 NOTE — Telephone Encounter (Signed)
Pt called stating she's unable to take the Renexa she had a severe reaction to it and was unable to move. She went to church and fell flat on her face, couldn't get out of the car. She was admitted at [facility] for 4 days   Please give pt a call [phone]

## 2020-09-29 ENCOUNTER — Encounter (HOSPITAL_COMMUNITY): Payer: Self-pay | Admitting: Emergency Medicine

## 2020-09-29 ENCOUNTER — Observation Stay (HOSPITAL_COMMUNITY)
Admission: EM | Admit: 2020-09-29 | Discharge: 2020-09-29 | Disposition: A | Discharge status: Home / Self Care | Payer: Medicare HMO | Attending: Family Medicine | Admitting: Family Medicine

## 2020-09-29 ENCOUNTER — Emergency Department (HOSPITAL_COMMUNITY): Payer: Medicare HMO

## 2020-09-29 ENCOUNTER — Other Ambulatory Visit: Payer: Self-pay

## 2020-09-29 ENCOUNTER — Observation Stay: Payer: Self-pay

## 2020-09-29 DIAGNOSIS — I5032 Chronic diastolic (congestive) heart failure: Secondary | ICD-10-CM | POA: Insufficient documentation

## 2020-09-29 DIAGNOSIS — K219 Gastro-esophageal reflux disease without esophagitis: Secondary | ICD-10-CM | POA: Diagnosis not present

## 2020-09-29 DIAGNOSIS — Z992 Dependence on renal dialysis: Secondary | ICD-10-CM | POA: Insufficient documentation

## 2020-09-29 DIAGNOSIS — Z79899 Other long term (current) drug therapy: Secondary | ICD-10-CM | POA: Diagnosis not present

## 2020-09-29 DIAGNOSIS — I252 Old myocardial infarction: Secondary | ICD-10-CM | POA: Insufficient documentation

## 2020-09-29 DIAGNOSIS — Z8673 Personal history of transient ischemic attack (TIA), and cerebral infarction without residual deficits: Secondary | ICD-10-CM | POA: Diagnosis not present

## 2020-09-29 DIAGNOSIS — E7800 Pure hypercholesterolemia, unspecified: Secondary | ICD-10-CM | POA: Diagnosis not present

## 2020-09-29 DIAGNOSIS — I132 Hypertensive heart and chronic kidney disease with heart failure and with stage 5 chronic kidney disease, or end stage renal disease: Secondary | ICD-10-CM | POA: Diagnosis not present

## 2020-09-29 DIAGNOSIS — Z7951 Long term (current) use of inhaled steroids: Secondary | ICD-10-CM | POA: Diagnosis not present

## 2020-09-29 DIAGNOSIS — I480 Paroxysmal atrial fibrillation: Secondary | ICD-10-CM | POA: Diagnosis not present

## 2020-09-29 DIAGNOSIS — Z8543 Personal history of malignant neoplasm of ovary: Secondary | ICD-10-CM | POA: Insufficient documentation

## 2020-09-29 DIAGNOSIS — U071 COVID-19: Secondary | ICD-10-CM

## 2020-09-29 DIAGNOSIS — N186 End stage renal disease: Secondary | ICD-10-CM | POA: Diagnosis not present

## 2020-09-29 DIAGNOSIS — I200 Unstable angina: Principal | ICD-10-CM | POA: Insufficient documentation

## 2020-09-29 DIAGNOSIS — R079 Chest pain, unspecified: Secondary | ICD-10-CM | POA: Diagnosis present

## 2020-09-29 LAB — LIPID PANEL
Cholesterol: 122 mg/dL (ref 0–200)
HDL: 63 mg/dL (ref >40)
LDL Cholesterol: 50 mg/dL (ref 0–99)
Total CHOL/HDL Ratio: 1.9 RATIO
Triglycerides: 45 mg/dL (ref <150)
VLDL: 9 mg/dL (ref 0–40)

## 2020-09-29 LAB — LACTATE DEHYDROGENASE: LDH: 276 U/L — ABNORMAL HIGH (ref 98–192)

## 2020-09-29 LAB — FERRITIN: Ferritin: 506 ng/mL — ABNORMAL HIGH (ref 11–307)

## 2020-09-29 LAB — CBC WITH DIFFERENTIAL/PLATELET
Abs Immature Granulocytes: 0.03 10*3/uL (ref 0.00–0.07)
Basophils Absolute: 0.1 10*3/uL (ref 0.0–0.1)
Basophils Relative: 1 %
Eosinophils Absolute: 0.3 10*3/uL (ref 0.0–0.5)
Eosinophils Relative: 5 %
HCT: 33 % — ABNORMAL LOW (ref 36.0–46.0)
Hemoglobin: 9.8 g/dL — ABNORMAL LOW (ref 12.0–15.0)
Immature Granulocytes: 1 %
Lymphocytes Relative: 17 %
Lymphs Abs: 0.9 10*3/uL (ref 0.7–4.0)
MCH: 36.6 pg — ABNORMAL HIGH (ref 26.0–34.0)
MCHC: 29.7 g/dL — ABNORMAL LOW (ref 30.0–36.0)
MCV: 123.1 fL — ABNORMAL HIGH (ref 80.0–100.0)
Monocytes Absolute: 0.6 10*3/uL (ref 0.1–1.0)
Monocytes Relative: 11 %
Neutro Abs: 3.6 10*3/uL (ref 1.7–7.7)
Neutrophils Relative %: 65 %
Platelets: 210 10*3/uL (ref 150–400)
RBC: 2.68 MIL/uL — ABNORMAL LOW (ref 3.87–5.11)
RDW: 19.2 % — ABNORMAL HIGH (ref 11.5–15.5)
WBC: 5.6 10*3/uL (ref 4.0–10.5)
nRBC: 0 % (ref 0.0–0.2)

## 2020-09-29 LAB — BASIC METABOLIC PANEL
Anion gap: 12 (ref 5–15)
BUN: 27 mg/dL — ABNORMAL HIGH (ref 8–23)
CO2: 26 mmol/L (ref 22–32)
Calcium: 9.7 mg/dL (ref 8.9–10.3)
Chloride: 101 mmol/L (ref 98–111)
Creatinine, Ser: 5.44 mg/dL — ABNORMAL HIGH (ref 0.44–1.00)
GFR, Estimated: 7 mL/min — ABNORMAL LOW (ref >60)
Glucose, Bld: 99 mg/dL (ref 70–99)
Potassium: 4.3 mmol/L (ref 3.5–5.1)
Sodium: 139 mmol/L (ref 135–145)

## 2020-09-29 LAB — TROPONIN I (HIGH SENSITIVITY)
Troponin I (High Sensitivity): 38 ng/L — ABNORMAL HIGH (ref <18)
Troponin I (High Sensitivity): 43 ng/L — ABNORMAL HIGH (ref <18)
Troponin I (High Sensitivity): 45 ng/L — ABNORMAL HIGH (ref <18)
Troponin I (High Sensitivity): 46 ng/L — ABNORMAL HIGH (ref <18)

## 2020-09-29 LAB — C-REACTIVE PROTEIN: CRP: <0.5 mg/dL (ref <1.0)

## 2020-09-29 LAB — RESP PANEL BY RT-PCR (FLU A&B, COVID) ARPGX2
Influenza A by PCR: NEGATIVE
Influenza B by PCR: NEGATIVE
SARS Coronavirus 2 by RT PCR: POSITIVE — AB

## 2020-09-29 LAB — FIBRINOGEN: Fibrinogen: 386 mg/dL (ref 210–475)

## 2020-09-29 LAB — D-DIMER, QUANTITATIVE: D-Dimer, Quant: 1 ug/mL-FEU — ABNORMAL HIGH (ref 0.00–0.50)

## 2020-09-29 LAB — PROCALCITONIN: Procalcitonin: 0.64 ng/mL

## 2020-09-29 LAB — BRAIN NATRIURETIC PEPTIDE: B Natriuretic Peptide: 161 pg/mL — ABNORMAL HIGH (ref 0.0–100.0)

## 2020-09-29 IMAGING — DX DG CHEST 1V PORT
1 series · 1 of 1 positions shown · non-contrast
Comparison: Radiograph [DATE]

CLINICAL DATA: Chest pain for 2 days, worsening, history of CABG,
bronchitis, CHF

EXAM:
PORTABLE CHEST 1 VIEW

[chest ap]
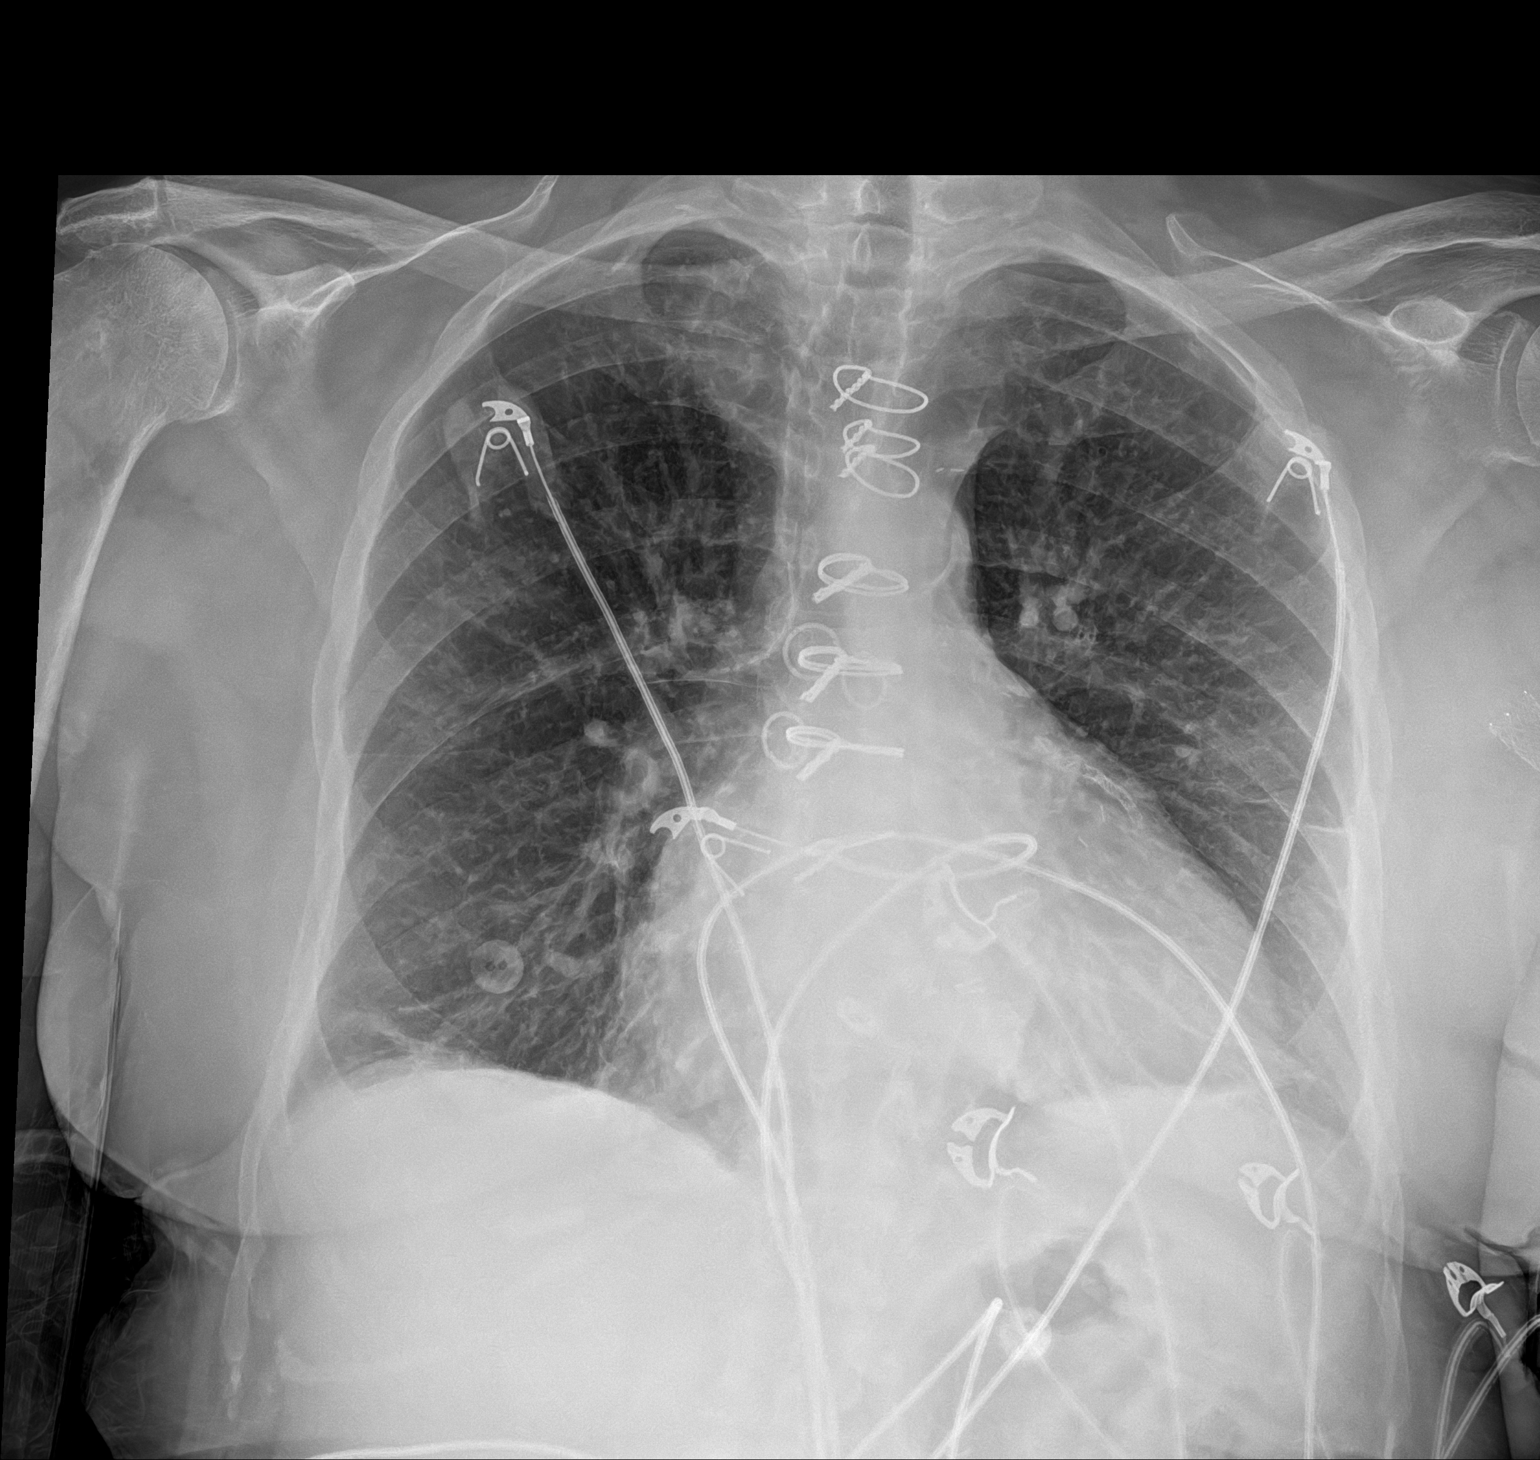

[1 of 1 positions shown; findings below may reference images not displayed]

FINDINGS: Stable cardiomediastinal contours with evidence of prior sternotomy
and CABG. Coronary stents are noted as well. Calcified tortuous
aorta is similar to prior. Telemetry leads overlie the chest as
well.Left axillary/upper extremity vascular stent partially
visualized.

Some chronically coarsened interstitial changes and bronchitic
features are similar to comparison exam. Additional bandlike areas
of scarring and/or atelectasis in the lung bases. No acute
superimposed consolidative process is seen. No pneumothorax or
layering effusion. Degenerative changes are present in the imaged
spine and shoulders.
IMPRESSION: 1. Chronic interstitial changes and bronchitic features, unchanged
from prior as detailed above.
2. No acute superimposed cardiopulmonary process.
3. Prior sternotomy and CABG.  Stable cardiomegaly.
4.  Aortic Atherosclerosis ([54]-[54]).

## 2020-09-29 MED ORDER — ISOSORBIDE MONONITRATE ER 60 MG PO TB24
60.0000 mg | ORAL_TABLET | Freq: Two times a day (BID) | ORAL | Status: DC
  Administered 2020-09-29: 60 mg via ORAL
  Filled 2020-09-29: qty 1

## 2020-09-29 MED ORDER — HYDROCOD POLST-CPM POLST ER 10-8 MG/5ML PO SUER: 5.0000 mL | Freq: Two times a day (BID) | ORAL | Status: DC | PRN

## 2020-09-29 MED ORDER — LORATADINE 10 MG PO TABS: 10.0000 mg | ORAL_TABLET | Freq: Every day | ORAL | Status: DC | PRN

## 2020-09-29 MED ORDER — ASPIRIN 81 MG PO CHEW
81.0000 mg | CHEWABLE_TABLET | Freq: Once | ORAL | Status: AC
  Administered 2020-09-29: 81 mg via ORAL
  Filled 2020-09-29: qty 1

## 2020-09-29 MED ORDER — ISOSORBIDE MONONITRATE ER 30 MG PO TB24: 90.0000 mg | ORAL_TABLET | Freq: Two times a day (BID) | ORAL | 2 refills | Status: DC

## 2020-09-29 MED ORDER — FLUTICASONE PROPIONATE 50 MCG/ACT NA SUSP: 1.0000 | Freq: Every evening | NASAL | Status: DC | PRN

## 2020-09-29 MED ORDER — HEPARIN (PORCINE) 25000 UT/250ML-% IV SOLN
700.0000 [IU]/h | INTRAVENOUS | Status: DC
  Administered 2020-09-29: 700 [IU]/h via INTRAVENOUS
  Filled 2020-09-29: qty 250

## 2020-09-29 MED ORDER — NITROGLYCERIN 0.4 MG SL SUBL: 0.4000 mg | SUBLINGUAL_TABLET | SUBLINGUAL | Status: DC | PRN

## 2020-09-29 MED ORDER — AMIODARONE HCL 200 MG PO TABS
200.0000 mg | ORAL_TABLET | Freq: Every day | ORAL | Status: DC
Start: 2020-09-29 — End: 2020-09-29
  Administered 2020-09-29: 200 mg via ORAL
  Filled 2020-09-29: qty 1

## 2020-09-29 MED ORDER — ZINC SULFATE 220 (50 ZN) MG PO CAPS
220.0000 mg | ORAL_CAPSULE | Freq: Every day | ORAL | Status: DC
  Administered 2020-09-29: 220 mg via ORAL
  Filled 2020-09-29: qty 1

## 2020-09-29 MED ORDER — SEVELAMER CARBONATE 800 MG PO TABS
1600.0000 mg | ORAL_TABLET | Freq: Three times a day (TID) | ORAL | Status: DC
  Administered 2020-09-29 (×2): 1600 mg via ORAL
  Filled 2020-09-29 (×2): qty 2

## 2020-09-29 MED ORDER — HEPARIN BOLUS VIA INFUSION
2000.0000 [IU] | Freq: Once | INTRAVENOUS | Status: AC
  Administered 2020-09-29: 2000 [IU] via INTRAVENOUS

## 2020-09-29 MED ORDER — GUAIFENESIN-DM 100-10 MG/5ML PO SYRP: 10.0000 mL | ORAL_SOLUTION | ORAL | Status: DC | PRN

## 2020-09-29 MED ORDER — ASPIRIN EC 81 MG PO TBEC
81.0000 mg | DELAYED_RELEASE_TABLET | Freq: Every day | ORAL | Status: DC
  Administered 2020-09-29: 81 mg via ORAL
  Filled 2020-09-29: qty 1

## 2020-09-29 MED ORDER — ACETAMINOPHEN 325 MG PO TABS: 650.0000 mg | ORAL_TABLET | ORAL | Status: DC | PRN

## 2020-09-29 MED ORDER — ISOSORBIDE MONONITRATE ER 60 MG PO TB24: 90.0000 mg | ORAL_TABLET | Freq: Two times a day (BID) | ORAL | Status: DC

## 2020-09-29 MED ORDER — SODIUM CHLORIDE 0.9 % IV SOLN: 100.0000 mg | INTRAVENOUS | Status: DC

## 2020-09-29 MED ORDER — ONDANSETRON HCL 4 MG/2ML IJ SOLN: 4.0000 mg | Freq: Four times a day (QID) | INTRAMUSCULAR | Status: DC | PRN

## 2020-09-29 MED ORDER — SODIUM CHLORIDE 0.9 % IV SOLN
100.0000 mg | Freq: Every day | INTRAVENOUS | Status: DC
  Filled 2020-09-29: qty 20

## 2020-09-29 MED ORDER — ALBUTEROL SULFATE HFA 108 (90 BASE) MCG/ACT IN AERS: 2.0000 | INHALATION_SPRAY | Freq: Four times a day (QID) | RESPIRATORY_TRACT | Status: DC | PRN

## 2020-09-29 MED ORDER — SIMVASTATIN 10 MG PO TABS: 20.0000 mg | ORAL_TABLET | Freq: Every day | ORAL | Status: DC

## 2020-09-29 MED ORDER — ASCORBIC ACID 500 MG PO TABS
500.0000 mg | ORAL_TABLET | Freq: Every day | ORAL | Status: DC
  Administered 2020-09-29: 500 mg via ORAL
  Filled 2020-09-29: qty 1

## 2020-09-29 NOTE — Progress Notes (Signed)
ANTICOAGULATION CONSULT NOTE - Initial Consult  Pharmacy Consult for heparin Indication: chest pain/ACS  Allergies  Allergen Reactions  . Aspirin Other (See Comments)    High Doses Mess up her stomach; "makes my bowels have blood in them". Takes 81 mg EC Aspirin   . Penicillins Other (See Comments)    SYNCOPE? , "makes me real weak when I take it; like I'll pass out"  Has patient had a PCN reaction causing immediate rash, facial/tongue/throat swelling, SOB or lightheadedness with hypotension: Yes Has patient had a PCN reaction causing severe rash involving mucus membranes or skin necrosis: no Has patient had a PCN reaction that required hospitalization no Has patient had a PCN reaction occurring within the last 10 years: no If all of the above  . Amlodipine Swelling  . Bactrim [Sulfamethoxazole-Trimethoprim] Rash  . Contrast Media [Iodinated Diagnostic Agents] Itching  . Iron Itching and Other (See Comments)    "they gave me iron in dialysis; had to give me Benadryl cause I had to have the iron" (05/02/2012)  . Nitrofurantoin Hives  . Tylenol [Acetaminophen] Itching and Other (See Comments)    Makes her feet on fire per pt  . Gabapentin Other (See Comments)    Unknown reaction  . Hydralazine Itching  . Ranexa [Ranolazine] Other (See Comments)    Myoclonus-hospitalized   . Dexilant [Dexlansoprazole] Other (See Comments)    Upset stomach  . Levaquin [Levofloxacin In D5w] Rash  . Morphine And Related Itching and Other (See Comments)    Itching in feet  . Plavix [Clopidogrel Bisulfate] Rash  . Protonix [Pantoprazole Sodium] Rash  . Venofer [Ferric Oxide] Itching and Other (See Comments)    Patient reports using Benadryl prior to doses as [place]    Patient Measurements: Height: 5\' 1"  (154.9 cm) Weight: 61.2 kg (135 lb) IBW/kg (Calculated) : 47.8 Heparin Dosing Weight: 60kg  Vital Signs: Temp: 98.4 F (36.9 C) (03/22 0036) Temp Source: Oral (03/22 0036) BP:  154/46 (03/22 0400) Pulse Rate: 66 (03/22 0400)  Labs: Recent Labs    09/29/20 0112 09/29/20 0242  HGB 9.8*  --   HCT 33.0*  --   PLT 210  --   CREATININE 5.44*  --   TROPONINIHS 38* 43*    Estimated Creatinine Clearance: 6.9 mL/min (A) (by C-G formula based on SCr of 5.44 mg/dL (H)).   Medical History: Past Medical History:  Diagnosis Date  . Acute on chronic respiratory failure with hypoxia ([place]) 10/10/2016  . Anxiety   . Arthritis   . AVM (arteriovenous malformation) of colon   . CAD (coronary artery disease)    a. s/p CABG in 2013 b. DES to D1 in 10/2016. c. cath in 07/2018 showing patent grafts with occlusion of D1 at prior stent site and progression of PDA disease --> medical management recommended  . Carotid artery disease ([place])    a. 55-73% LICA, 08/2023   . Chronic anemia   . Chronic bronchitis ([place])   . Chronic diastolic CHF (congestive heart failure) ([place])    a. 02/2012 Echo EF 60-65%, nl wall motion, Gr 1 DD, mod MR  . Colon cancer ([name]) 1992  . Esophageal stricture   . ESRD on hemodialysis ([place])    ESRD due to HTN, started dialysis 2011 and gets HD at [facility] with Dr Hinda Lenis on MWF schedule.  Access is LUA AVF as of Sept 2014.   Marland Kitchen GERD (gastroesophageal reflux disease)   . High cholesterol 12/2011  .  History of blood transfusion 07/2011; 12/2011; 01/2012 X 2; 04/2012  . History of gout   . History of lower GI bleeding   . Hypertension   . Iron deficiency anemia   . Mitral regurgitation    a. Moderate by echo, 02/2012  . Myocardial infarction ([place])   . NSVT (nonsustained ventricular tachycardia) ([place])   . Ovarian cancer ([place]) 1992  . PAF (paroxysmal atrial fibrillation) ([place])   . Pneumonia ~ 2009  . PUD (peptic ulcer disease)   . TIA (transient ischemic attack)     Assessment: 80yo female w/ significant cardiac hx c/o intermittent CP relieved w/ NTG, troponin mildly elevated, to begin heparin.  Goal of Therapy:  Heparin level 0.3-0.7  units/ml Monitor platelets by anticoagulation protocol: Yes   Plan:  Will give heparin 2000 units IV bolus x1 followed by gtt at 700 units/hr and monitor heparin levels and CBC.  Wynona Neat, PharmD, BCPS  09/29/2020,4:39 AM

## 2020-09-29 NOTE — TOC Progression Note (Signed)
Transition of Care [facility]) - Progression Note    Patient Details  Name: Shawna [name] MRN: [id number] Date of Birth: 1941-04-27  [facility]) CM/SW Contact  Salome Arnt, [place] Phone Number: 09/29/2020, 11:50 AM  Clinical Narrative:  LCSW spoke with Joy at [facility] to notify clinic of positive COVID test. Caryl Asp states she will work on finding pt dialysis chair at [facility] and requests that pt call her when she leaves hospital. Caryl Asp is aware of anticipated d/c today. COVID results faxed to [place]. Pt updated and reports understanding of need to call Joy. She requests repeat COVID test due to no symptoms. MD notified.            Expected Discharge Plan and Services           Expected Discharge Date: 09/29/20                                     Social Determinants of Health (SDOH) Interventions    Readmission Risk Interventions Readmission Risk Prevention Plan 09/02/2020 06/15/2020 06/18/2019  Transportation Screening Complete Complete Complete  PCP or Specialist Appt within 3-5 Days - - -  HRI or [place] for [place] - - -  Medication Review [employer]) Complete Complete Complete  PCP or Specialist appointment within 3-5 days of discharge - Complete -  [place] or [place] Complete Complete -  SW Recovery Care/Counseling Consult Complete Complete Complete  Palliative Care Screening Not Applicable Not Applicable Not [place] Patient Refused Not Applicable Not Applicable  Some recent data might be hidden

## 2020-09-29 NOTE — ED Provider Notes (Signed)
[facility] EMERGENCY DEPARTMENT Provider Note   CSN: [id number] Arrival date & time: 09/29/20  0175     History Chief Complaint  Patient presents with  . Chest Pain    DEYANNA [name] is a 80 y.o. female.  Patient with a history of CAD status post CABG, patent grafts on last cath in January 2020.  Diastolic CHF, ESRD on dialysis, hypertension presenting with central chest pain intermittent over the past 2 days.  She describes pain and pressure in the center of her chest that radiates to her back.  She states the pain lasts for 30 to 60 minutes at a time.  She had some yesterday which resolved after 1 nitroglycerin.  She had the pain again today while she was resting in bed and took a nitroglycerin and is now pain-free.  She describes the pain in the center of her chest that radiates to her back.  There are some associated shortness of breath.  No nausea or vomiting.  No sweating.  No cough or fever.  No abdominal pain. She went to dialysis today with no missed sessions. She is unclear whether this pain is representative of angina type pain that she had previously  The history is provided by the patient.  Chest Pain Associated symptoms: shortness of breath   Associated symptoms: no abdominal pain, no dizziness, no fever, no headache, no nausea, no vomiting and no weakness        Past Medical History:  Diagnosis Date  . Acute on chronic respiratory failure with hypoxia ([place]) 10/10/2016  . Anxiety   . Arthritis   . AVM (arteriovenous malformation) of colon   . CAD (coronary artery disease)    a. s/p CABG in 2013 b. DES to D1 in 10/2016. c. cath in 07/2018 showing patent grafts with occlusion of D1 at prior stent site and progression of PDA disease --> medical management recommended  . Carotid artery disease ([place])    a. 10-25% LICA, 02/5276   . Chronic anemia   . Chronic bronchitis ([place])   . Chronic diastolic CHF (congestive heart failure) ([place])    a. 02/2012 Echo EF 60-65%, nl wall  motion, Gr 1 DD, mod MR  . Colon cancer ([place]) 1992  . Esophageal stricture   . ESRD on hemodialysis ([place])    ESRD due to HTN, started dialysis 2011 and gets HD [facility] with Dr Hinda Lenis on MWF schedule.  Access is LUA AVF as of Sept 2014.   Marland Kitchen GERD (gastroesophageal reflux disease)   . High cholesterol 12/2011  . History of blood transfusion 07/2011; 12/2011; 01/2012 X 2; 04/2012  . History of gout   . History of lower GI bleeding   . Hypertension   . Iron deficiency anemia   . Mitral regurgitation    a. Moderate by echo, 02/2012  . Myocardial infarction ([place])   . NSVT (nonsustained ventricular tachycardia) ([place])   . Ovarian cancer ([place]) 1992  . PAF (paroxysmal atrial fibrillation) ([place])   . Pneumonia ~ 2009  . PUD (peptic ulcer disease)   . TIA (transient ischemic attack)     Patient Active Problem List   Diagnosis Date Noted  . Myoclonus 08/31/2020  . Clotted renal dialysis AV graft, initial encounter ([place])   . Hypertensive heart and chronic kidney disease with heart failure and stage 1 through stage 4 chronic kidney disease, or chronic kidney disease ([place])   . Hemodialysis-associated hypotension   . Acute hypoxemic respiratory failure ([place]) 06/14/2020  .  Hypertension 06/12/2020  . Irritable bowel syndrome 02/25/2020  . Pulmonary edema 10/14/2019  . Adenomatous duodenal polyp 09/10/2019  . History of GI bleed 09/10/2019  . Angina pectoris ([place]) 06/05/2019  . Chest pain 06/03/2019  . Rectal bleeding 05/14/2019  . Small intestinal bacterial overgrowth 05/14/2019  . Iron deficiency anemia 04/02/2019  . GI bleed 03/08/2019  . Gastrointestinal hemorrhage with melena 03/06/2019  . Acute respiratory failure with hypoxia ([place]) 12/25/2018  . Elevated troponin 12/14/2018  . Chest pain at rest 07/13/2018  . Hand steal syndrome ([place]) 08/01/2017  . Macrocytic anemia 07/14/2017  . Coronary artery disease 06/05/2017  . Mesenteric ischemia ([place])   . Diverticulitis   . Enteritis    . Complication of vascular access for dialysis 03/19/2017  . Preoperative clearance 01/25/2017  . Symptomatic anemia 10/24/2016  . H/O non-ST elevation myocardial infarction (NSTEMI) 10/24/2016  . Fluid overload 10/10/2016  . Complication from renal dialysis device 10/10/2016  . Non-ST elevation MI (NSTEMI) ([place])   . Encounter for fitting and adjustment of vascular catheter   . Heme positive stool   . Demand ischemia ([place]) 07/27/2016  . Hypertensive emergency 07/08/2016  . Acute on chronic respiratory failure with hypoxia ([place])   . Cardiac arrest ([place])   . Palliative care encounter   . Goals of care, counseling/discussion   . Hypertensive crisis without congestive heart failure 05/09/2016  . Acute pulmonary edema ([place]) 04/06/2016  . Acute respiratory failure ([place]) 04/06/2016  . Hypertensive crisis 01/27/2016  . History of colon cancer 01/27/2016  . History of ovarian cancer 01/27/2016  . Hypertensive urgency 01/27/2016  . Paroxysmal atrial fibrillation ([place]) 10/14/2015  . Coronary angioplasty status 10/14/2015  . Malignant neoplasm of right ovary ([place]) 10/14/2015  . Narrow complex tachycardia ([place]) 09/08/2015  . SVT (supraventricular tachycardia) ([place]) 09/08/2015  . Influenza A 08/30/2015  . Acute on chronic diastolic CHF (congestive heart failure) ([place]) 05/04/2015  . Unstable angina ([place]) 05/03/2015  . DOE (dyspnea on exertion)   . Essential hypertension   . Pain in joint, lower leg 08/14/2014  . Dacryocystitis 05/29/2013  . Chronic diastolic CHF (congestive heart failure) ([place]) 03/22/2013  . GI bleeding 03/21/2013  . Acute blood loss anemia 03/21/2013  . Vaginal odor 03/12/2013  . Vaginal discharge 03/12/2013  . Occlusion and stenosis of carotid artery without mention of cerebral infarction 01/24/2013  . Hx of CABG 07/05/2012  . Carotid artery disease ([place]) 07/05/2012  . Mitral regurgitation 06/12/2012  . Pneumonia 06/09/2012  . Non-STEMI (non-ST elevated myocardial  infarction) ([place]) 06/08/2012  . Ischemic chest pain ([place]) 03/01/2012  . 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 GERD (gastroesophageal reflux disease) 01/09/2012  . HLD (hyperlipidemia) 01/05/2012  . Atherosclerotic heart disease of native coronary artery without angina pectoris 12/16/2011  . Essential hypertension, benign 12/16/2011  . ESRD on hemodialysis ([place]) 12/16/2011  . Anxiety disorder 05/04/2011  . Anemia in chronic kidney disease 04/29/2011  . Secondary hyperparathyroidism of renal origin ([place]) 04/29/2011  . ESRD (end stage renal disease) on dialysis ([place]) 04/29/2011  . Gout 04/29/2011  . Hypertensive chronic kidney disease with stage 5 chronic kidney disease or end stage renal disease ([place]) 04/29/2011    Past Surgical History:  Procedure Laterality Date  . A/V SHUNTOGRAM Left 03/19/2019   Procedure: A/V SHUNTOGRAM;  Surgeon: Katha Cabal, MD;  Location: [place] CV LAB;  Service: Cardiovascular;  Laterality: Left;  . ABDOMINAL HYSTERECTOMY  1992  . APPENDECTOMY  06/1990  . AV  FISTULA PLACEMENT  07/2009   left upper arm  . AV FISTULA PLACEMENT Right 09/06/2016   Procedure: RIGHT FOREARM ARTERIOVENOUS (AV) GRAFT;  Surgeon: Elam Dutch, MD;  Location: [facility] OR;  Service: Vascular;  Laterality: Right;  . AV FISTULA PLACEMENT N/A 02/24/2017   Procedure: INSERTION OF ARTERIOVENOUS (AV) GORE-TEX GRAFT ARM (BRACHIAL AXILLARY);  Surgeon: Katha Cabal, MD;  Location: [facility] ORS;  Service: Vascular;  Laterality: N/A;  . [place] Right 09/06/2016   Procedure: REMOVAL OF Right Arm ARTERIOVENOUS GORETEX GRAFT and Vein Patch angioplasty of brachial artery;  Surgeon: Angelia Mould, MD;  Location: [place];  Service: Vascular;  Laterality: Right;  . BIOPSY  09/26/2019   Procedure: BIOPSY;  Surgeon: Rogene Houston, MD;  [facility];  Service: Endoscopy;;  . COLON RESECTION  1992  . COLON SURGERY    . COLONOSCOPY N/A 03/09/2019    Procedure: COLONOSCOPY;  Surgeon: Rogene Houston, MD;  [facility];  Service: Endoscopy;  Laterality: N/A;  . CORONARY ANGIOPLASTY WITH STENT PLACEMENT  12/15/11   "2"  . CORONARY ANGIOPLASTY WITH STENT PLACEMENT  y/2013   "1; makes total of 3" (05/02/2012)  . CORONARY ARTERY BYPASS GRAFT  06/13/2012   Procedure: CORONARY ARTERY BYPASS GRAFTING (CABG);  Surgeon: Grace Isaac, MD;  Location: [place];  Service: Open Heart Surgery;  Laterality: N/A;  cabg x four;  using left internal mammary artery, and left leg greater saphenous vein harvested endoscopically  . CORONARY STENT INTERVENTION N/A 10/13/2016   Procedure: Coronary Stent Intervention;  Surgeon: Troy Sine, MD;  Location: [place] CV LAB;  Service: Cardiovascular;  Laterality: N/A;  . DIALYSIS/PERMA CATHETER REMOVAL N/A 04/18/2017   Procedure: DIALYSIS/PERMA CATHETER REMOVAL;  Surgeon: Katha Cabal, MD;  Location: [place] CV LAB;  Service: Cardiovascular;  Laterality: N/A;  . DILATION AND CURETTAGE OF UTERUS    . ESOPHAGOGASTRODUODENOSCOPY  01/20/2012   Procedure: ESOPHAGOGASTRODUODENOSCOPY (EGD);  Surgeon: Ladene Artist, MD,FACG;  Location: [facility] ENDOSCOPY;  Service: Endoscopy;  Laterality: N/A;  . ESOPHAGOGASTRODUODENOSCOPY N/A 03/26/2013   Procedure: ESOPHAGOGASTRODUODENOSCOPY (EGD);  Surgeon: Irene Shipper, MD;  Location: [facility] ENDOSCOPY;  Service: Endoscopy;  Laterality: N/A;  . ESOPHAGOGASTRODUODENOSCOPY N/A 04/30/2015   Procedure: ESOPHAGOGASTRODUODENOSCOPY (EGD);  Surgeon: Rogene Houston, MD;  [facility];  Service: Endoscopy;  Laterality: N/A;  1pm - moved to 10/20 @ 1:10  . ESOPHAGOGASTRODUODENOSCOPY N/A 07/29/2016   Procedure: ESOPHAGOGASTRODUODENOSCOPY (EGD);  Surgeon: Manus Gunning, MD;  Location: [place];  Service: Gastroenterology;  Laterality: N/A;  enteroscopy  . ESOPHAGOGASTRODUODENOSCOPY N/A 09/26/2019   Procedure: ESOPHAGOGASTRODUODENOSCOPY (EGD);  Surgeon: Rogene Houston, MD;  [facility];  Service: Endoscopy;  Laterality: N/A;  1250  . GIVENS CAPSULE STUDY N/A 03/07/2019   Procedure: GIVENS CAPSULE STUDY;  Surgeon: Rogene Houston, MD;  [facility];  Service: Endoscopy;  Laterality: N/A;  7:30  . INTRAOPERATIVE TRANSESOPHAGEAL ECHOCARDIOGRAM  06/13/2012   Procedure: INTRAOPERATIVE TRANSESOPHAGEAL ECHOCARDIOGRAM;  Surgeon: Grace Isaac, MD;  Location: [place];  Service: Open Heart Surgery;  Laterality: N/A;  . IR FLUORO GUIDE CV LINE RIGHT  06/17/2020  . IR GENERIC HISTORICAL  07/26/2016   IR FLUORO GUIDE CV LINE RIGHT 07/26/2016 Greggory Keen, MD [facility]  . IR GENERIC HISTORICAL  07/26/2016   IR US GUIDE VASC ACCESS RIGHT 07/26/2016 Greggory Keen, MD [facility]  . IR GENERIC HISTORICAL  08/02/2016   IR US GUIDE  VASC ACCESS RIGHT 08/02/2016 Greggory Keen, MD [facility]  . IR GENERIC HISTORICAL  08/02/2016   IR FLUORO GUIDE CV LINE RIGHT 08/02/2016 Greggory Keen, MD [facility]  . IR RADIOLOGY PERIPHERAL GUIDED IV START  03/28/2017  . IR REMOVAL TUN CV CATH W/O FL  08/11/2020  . IR THROMBECTOMY AV FISTULA W/THROMBOLYSIS INC/SHUNT/IMG LEFT Left 06/17/2020  . IR US GUIDE VASC ACCESS LEFT  06/17/2020  . IR US GUIDE VASC ACCESS RIGHT  03/28/2017  . IR US GUIDE VASC ACCESS RIGHT  06/17/2020  . LEFT HEART CATH AND CORONARY ANGIOGRAPHY N/A 09/20/2016   Procedure: Left Heart Cath and Coronary Angiography;  Surgeon: [name], MD;  Location: [place] CV LAB;  Service: Cardiovascular;  Laterality: N/A;  . LEFT HEART CATH AND CORS/GRAFTS ANGIOGRAPHY N/A 10/13/2016   Procedure: Left Heart Cath and Cors/Grafts Angiography;  Surgeon: Troy Sine, MD;  Location: [place] [facility] LAB;  Service: Cardiovascular;  Laterality: N/A;  . LEFT HEART CATH AND CORS/GRAFTS ANGIOGRAPHY N/A 07/13/2018   Procedure: LEFT HEART CATH AND CORS/GRAFTS ANGIOGRAPHY;  Surgeon: Martinique, Peter M, MD;  Location: [place] [facility] LAB;  Service: Cardiovascular;   Laterality: N/A;  . LEFT HEART CATHETERIZATION WITH CORONARY ANGIOGRAM N/A 12/15/2011   Procedure: LEFT HEART CATHETERIZATION WITH CORONARY ANGIOGRAM;  Surgeon: Burnell Blanks, MD;  Location: [facility] CATH LAB;  Service: Cardiovascular;  Laterality: N/A;  . LEFT HEART CATHETERIZATION WITH CORONARY ANGIOGRAM N/A 01/10/2012   Procedure: LEFT HEART CATHETERIZATION WITH CORONARY ANGIOGRAM;  Surgeon: Peter M Martinique, MD;  Location: [facility] CATH LAB;  Service: Cardiovascular;  Laterality: N/A;  . LEFT HEART CATHETERIZATION WITH CORONARY ANGIOGRAM N/A 06/08/2012   Procedure: LEFT HEART CATHETERIZATION WITH CORONARY ANGIOGRAM;  Surgeon: Burnell Blanks, MD;  Location: [facility] CATH LAB;  Service: Cardiovascular;  Laterality: N/A;  . LEFT HEART CATHETERIZATION WITH CORONARY/GRAFT ANGIOGRAM N/A 12/10/2013   Procedure: LEFT HEART CATHETERIZATION WITH Beatrix Fetters;  Surgeon: [name], MD;  Location: [facility] CATH LAB;  Service: Cardiovascular;  Laterality: N/A;  . OVARY SURGERY     ovarian cancer  . POLYPECTOMY  03/09/2019   Procedure: POLYPECTOMY;  Surgeon: Rogene Houston, MD;  [facility];  Service: Endoscopy;;  cecal   . POLYPECTOMY N/A 09/26/2019   Procedure: DUODENAL POLYPECTOMY;  Surgeon: Rogene Houston, MD;  [facility];  Service: Endoscopy;  Laterality: N/A;  . REVISION OF ARTERIOVENOUS GORETEX GRAFT N/A 02/24/2017   Procedure: REVISION OF ARTERIOVENOUS GORETEX GRAFT (RESECTION);  Surgeon: Katha Cabal, MD;  Location: [facility] ORS;  Service: Vascular;  Laterality: N/A;  . REVISON OF ARTERIOVENOUS FISTULA Left 06/19/2020   Procedure: REVISION OF LEFT UPPER ARM AV GRAFT WITH INTERPOSITION JUMP GRAFT USING 6MM GORE LIMB;  Surgeon: Marty Heck, MD;  Location: [place];  Service: Vascular;  Laterality: Left;  . SHUNTOGRAM N/A 10/15/2013   Procedure: Fistulogram;  Surgeon: Serafina Mitchell, MD;  Location: [facility] CATH LAB;  Service: Cardiovascular;  Laterality: N/A;  .  THROMBECTOMY / ARTERIOVENOUS GRAFT REVISION  2011   left upper arm  . TUBAL LIGATION  1980's  . UPPER EXTREMITY ANGIOGRAPHY Bilateral 12/06/2016   Procedure: Upper Extremity Angiography;  Surgeon: Katha Cabal, MD;  Location: [place] [facility] LAB;  Service: Cardiovascular;  Laterality: Bilateral;  . UPPER EXTREMITY INTERVENTION Left 06/06/2017   Procedure: UPPER EXTREMITY INTERVENTION;  Surgeon: Katha Cabal, MD;  Location: [place] CV LAB;  Service: Cardiovascular;  Laterality: Left;  OB History    Gravida  2   Para  2   Term      Preterm  2   AB      Living  2     SAB      IAB      Ectopic      Multiple      Live Births              Family History  Problem Relation Age of Onset  . Heart disease Mother        Heart Disease before age 44  . Hyperlipidemia Mother   . Hypertension Mother   . Diabetes Mother   . Heart attack Mother   . Heart disease Father        Heart Disease before age 55  . Hyperlipidemia Father   . Hypertension Father   . Diabetes Father   . Diabetes Sister   . Hypertension Sister   . Diabetes Brother   . Hyperlipidemia Brother   . Heart attack Brother   . Hypertension Sister   . Heart attack Brother   . Colon cancer Child 72  . Other Other        noncontributory for early CAD  . Esophageal cancer Neg Hx   . Liver disease Neg Hx   . Kidney disease Neg Hx   . Colon polyps Neg Hx     Social History   Tobacco Use  . Smoking status: Never Smoker  . Smokeless tobacco: Never Used  Vaping Use  . Vaping Use: Never used  Substance Use Topics  . Alcohol use: No    Alcohol/week: 0.0 standard drinks  . Drug use: No    Home Medications Prior to Admission medications   Medication Sig Start Date End Date Taking? Authorizing Provider  albuterol (VENTOLIN HFA) 108 (90 Base) MCG/ACT inhaler Inhale 2 puffs into the lungs every 6 (six) hours as needed for wheezing or shortness of breath.    [provider]   amiodarone (PACERONE) 200 MG tablet Take 1 tablet (200 mg total) by mouth daily. Starting July 2 12/17/19   Ahmed Prima Fransisco Hertz, PA-C  aspirin EC 81 MG tablet Take 81 mg by mouth daily.  09/27/19   Rehman, Mechele Dawley, MD  dicyclomine (BENTYL) 10 MG capsule Take 10 mg by mouth 2 (two) times daily.    [provider]  fluconazole (DIFLUCAN) 100 MG tablet Take 100 mg by mouth daily. 08/17/20   [provider]  fluticasone (FLONASE) 50 MCG/ACT nasal spray Place 1 spray into both nostrils at bedtime as needed for allergies.     [provider]  isosorbide mononitrate (IMDUR) 60 MG 24 hr tablet Take 1 tablet (60 mg total) by mouth 2 (two) times daily. 08/10/20 11/08/20  Arnoldo Lenis, MD  ketoconazole (NIZORAL) 2 % cream Apply 1 application topically 3 (three) times daily. Patient not taking: No sig reported 08/17/20   [provider]  lidocaine-prilocaine (EMLA) cream Apply 1 application topically every Monday, Wednesday, and Friday. Prior to dialysis    [provider]  loratadine (CLARITIN) 10 MG tablet Take 1 tablet (10 mg total) by mouth daily as needed for allergies. 07/14/18   Hosie Poisson, MD  multivitamin (RENA-VIT) TABS tablet Take 1 tablet by mouth daily.    [provider]  nitroGLYCERIN (NITROSTAT) 0.4 MG SL tablet Place 1 tablet (0.4 mg total) under the tongue every 5 (five) minutes x 3 doses as needed  for chest pain (if no relief after 2nd dose, proceed to the ED for an evaluation or call 911). 09/08/20   Verta Ellen., NP  omeprazole (PRILOSEC) 40 MG capsule Take 1 capsule (40 mg total) by mouth daily before breakfast. 03/31/20   Laurine Blazer B, PA-C  sevelamer carbonate (RENVELA) 800 MG tablet Take 2 tablets (1,600 mg total) by mouth 3 (three) times daily with meals. 06/20/20   Meccariello, Bernita Raisin, DO  simvastatin (ZOCOR) 20 MG tablet Take 1 tablet (20 mg total) by mouth at bedtime. 07/07/20   Verta Ellen., NP    Allergies     Aspirin, Penicillins, Amlodipine, Bactrim [sulfamethoxazole-trimethoprim], Contrast media [iodinated diagnostic agents], Iron, Nitrofurantoin, Tylenol [acetaminophen], Gabapentin, Hydralazine, Ranexa [ranolazine], Dexilant [dexlansoprazole], Levaquin [levofloxacin in d5w], Morphine and related, Plavix [clopidogrel bisulfate], Protonix [pantoprazole sodium], and Venofer [ferric oxide]  Review of Systems   Review of Systems  Constitutional: Negative for activity change, appetite change and fever.  HENT: Negative for congestion.   Eyes: Negative for visual disturbance.  Respiratory: Positive for chest tightness and shortness of breath.   Cardiovascular: Positive for chest pain.  Gastrointestinal: Negative for abdominal pain, nausea and vomiting.  Genitourinary: Negative for dysuria and hematuria.  Musculoskeletal: Negative for arthralgias and myalgias.  Skin: Negative for rash.  Neurological: Negative for dizziness, weakness and headaches.   all other systems are negative except as noted in the HPI and PMH.    Physical Exam Updated Vital Signs BP (!) 164/53   Pulse 74   Temp 98.4 F (36.9 C) (Oral)   Resp (!) 21   Ht 5\' 1"  (1.549 m)   Wt 61.2 kg   SpO2 95%   BMI 25.51 kg/m   Physical Exam Vitals and nursing note reviewed.  Constitutional:      General: She is not in acute distress.    Appearance: She is well-developed.  HENT:     Head: Normocephalic and atraumatic.     Mouth/Throat:     Pharynx: No oropharyngeal exudate.  Eyes:     Conjunctiva/sclera: Conjunctivae normal.     Pupils: Pupils are equal, round, and reactive to light.  Neck:     Comments: No meningismus. Cardiovascular:     Rate and Rhythm: Normal rate and regular rhythm.     Heart sounds: Normal heart sounds. No murmur heard.   Pulmonary:     Effort: Pulmonary effort is normal. No respiratory distress.     Breath sounds: Normal breath sounds.  Chest:     Chest wall: No tenderness.  Abdominal:      Palpations: Abdomen is soft.     Tenderness: There is no abdominal tenderness. There is no guarding or rebound.  Musculoskeletal:        General: No tenderness. Normal range of motion.     Cervical back: Normal range of motion and neck supple.     Comments: Dialysis fistula left upper extremity with palpable thrill  Skin:    General: Skin is warm.  Neurological:     Mental Status: She is alert and oriented to person, place, and time.     Cranial Nerves: No cranial nerve deficit.     Motor: No abnormal muscle tone.     Coordination: Coordination normal.     Comments: No ataxia on finger to nose bilaterally. No pronator drift. 5/5 strength throughout. CN 2-12 intact.Equal grip strength. Sensation intact.   Psychiatric:        Behavior: Behavior normal.  ED Results / Procedures / Treatments   Labs (all labs ordered are listed, but only abnormal results are displayed) Labs Reviewed  CBC WITH DIFFERENTIAL/PLATELET - Abnormal; Notable for the following components:      Result Value   RBC 2.68 (*)    Hemoglobin 9.8 (*)    HCT 33.0 (*)    MCV 123.1 (*)    MCH 36.6 (*)    MCHC 29.7 (*)    RDW 19.2 (*)    All other components within normal limits  BASIC METABOLIC PANEL - Abnormal; Notable for the following components:   BUN 27 (*)    Creatinine, Ser 5.44 (*)    GFR, Estimated 7 (*)    All other components within normal limits  BRAIN NATRIURETIC PEPTIDE - Abnormal; Notable for the following components:   B Natriuretic Peptide 161.0 (*)    All other components within normal limits  TROPONIN I (HIGH SENSITIVITY) - Abnormal; Notable for the following components:   Troponin I (High Sensitivity) 38 (*)    All other components within normal limits  TROPONIN I (HIGH SENSITIVITY)    EKG EKG Interpretation  Date/Time:  Tuesday September 29 2020 00:38:23 EDT Ventricular Rate:  78 PR Interval:    QRS Duration: 149 QT Interval:  457 QTC Calculation: 521 R Axis:   68 Text  Interpretation: Sinus rhythm LVH with secondary repolarization abnormality Anterior infarct, acute (LAD) No significant change was found Confirmed by Ezequiel Essex [phone]) on 09/29/2020 12:44:31 AM   Radiology DG Chest Portable 1 View  Result Date: 09/29/2020 CLINICAL DATA:  Chest pain for 2 days, worsening, history of CABG, bronchitis, CHF EXAM: PORTABLE CHEST 1 VIEW COMPARISON:  Radiograph 08/30/2020 FINDINGS: Stable cardiomediastinal contours with evidence of prior sternotomy and CABG. Coronary stents are noted as well. Calcified tortuous aorta is similar to prior. Telemetry leads overlie the chest as well.Left axillary/upper extremity vascular stent partially visualized. Some chronically coarsened interstitial changes and bronchitic features are similar to comparison exam. Additional bandlike areas of scarring and/or atelectasis in the lung bases. No acute superimposed consolidative process is seen. No pneumothorax or layering effusion. Degenerative changes are present in the imaged spine and shoulders. IMPRESSION: 1. Chronic interstitial changes and bronchitic features, unchanged from prior as detailed above. 2. No acute superimposed cardiopulmonary process. 3. Prior sternotomy and CABG.  Stable cardiomegaly. 4.  Aortic Atherosclerosis (ICD10-I70.0). Electronically Signed   By: Lovena Le M.D.   On: 09/29/2020 01:08    Procedures .Critical Care Performed by: Ezequiel Essex, MD Authorized by: Ezequiel Essex, MD   Critical care provider statement:    Critical care time (minutes):  35   Critical care was necessary to treat or prevent imminent or life-threatening deterioration of the following conditions: unstable angina.   Critical care was time spent personally by me on the following activities:  Discussions with consultants, evaluation of patient's response to treatment, examination of patient, ordering and performing treatments and interventions, ordering and review of laboratory  studies, ordering and review of radiographic studies, pulse oximetry, re-evaluation of patient's condition, obtaining history from patient or surrogate and review of old charts     Medications Ordered in ED Medications  aspirin chewable tablet 81 mg (has no administration in time range)    ED Course  I have reviewed the triage vital signs and the nursing notes.  Pertinent labs & imaging results that were available during my care of the patient were reviewed by me and considered in my medical decision making (  see chart for details).    MDM Rules/Calculators/A&P                         Intermittent chest pain for the past several days but is concerning for unstable angina.  EKG shows left bundle branch block with stable LVH.  Aspirin given.  Patient remains chest pain-free.  Chest x-ray shows chronic interstitial features. Troponin mildly elevated at 38.  This appears to be her baseline.  No further chest pain.  Patient's description of pain is concerning for unstable angina however.  Her troponins remain flat and mildly elevated. Medical management was recommended during her last cardiac catheterization. Blood pressure reasonable controlled.  Patient agreeable to overnight observation and cardiology assessment in the morning.  Low suspicion for pulmonary embolism or aortic dissection. Incidentally Covid positive without symptoms or hypoxia. Aspirin given.  Will initiate IV heparin. Admission d/w Dr. Clearence Ped.    Final Clinical Impression(s) / ED Diagnoses Final diagnoses:  Unstable angina [facility])    Rx / DC Orders ED Discharge Orders    None       Rancour, Annie Main, MD 09/29/20 [phone]

## 2020-09-29 NOTE — ED Notes (Signed)
Pt needs vascular access to get additional access on pt today. Pt is a very hard iv stick and has heparin going through only line available. remdesivir not started due to no line available, admitting md notified.

## 2020-09-29 NOTE — Consult Note (Signed)
Shawna [name] Admit Date: 09/29/2020 09/29/2020 Shawna [name] Requesting Physician:  Roger Shelter MD  Reason for Consult:  ESRD comanagement HPI:  80F ESRD MWF [name] admitted overnight with chest pain, known hx/o sig CAD.  Also tested + for COVID19 at presentation but on RA and is ASx, she is vaccinated.  CP usually resolves with SL NTG, but this time it was more intense and of longer duration, with radiation into LUE.    In ED  Troponin borderline +.  Cardiology consulted.  She currently is CP free and has been so for some time.  Last HD 3/21, uneventful. Here K 4.3, HCO3 26.    PMH Incudes:  CAD/CABG  Recent admission fo rAMS and myoclonus 2/2 ranolizine and gabapentin  Paroxysmal AFib onamio, HTN  HFpEF  Hx/o GIB from colonic AVMs   Creat (mg/dL)  Date Value  03/05/2019 6.57 (H)   Creatinine, Ser (mg/dL)  Date Value  09/29/2020 5.44 (H)  09/01/2020 5.02 (H)  08/31/2020 9.83 (H)  08/30/2020 8.68 (H)  08/10/2020 4.85 (H)  06/20/2020 5.18 (H)  06/19/2020 7.44 (H)  06/18/2020 13.37 (H)  06/17/2020 9.87 (H)  06/16/2020 7.76 (H)  ] ROS Balance of 12 systems is negative w/ exceptions as above  PMH  Past Medical History:  Diagnosis Date  . Acute on chronic respiratory failure with hypoxia ([place]) 10/10/2016  . Anxiety   . Arthritis   . AVM (arteriovenous malformation) of colon   . CAD (coronary artery disease)    a. s/p CABG in 2013 b. DES to D1 in 10/2016. c. cath in 07/2018 showing patent grafts with occlusion of D1 at prior stent site and progression of PDA disease --> medical management recommended  . Carotid artery disease ([place])    a. 58-09% LICA, 03/8337   . Chronic anemia   . Chronic bronchitis ([place])   . Chronic diastolic CHF (congestive heart failure) ([place])    a. 02/2012 Echo EF 60-65%, nl wall motion, Gr 1 DD, mod MR  . Colon cancer ([place]) 1992  . Esophageal stricture   . ESRD on hemodialysis ([place])    ESRD due to HTN, started dialysis 2011 and  gets HD at [facility] with Dr Hinda Lenis on MWF schedule.  Access is LUA AVF as of Sept 2014.   Marland Kitchen GERD (gastroesophageal reflux disease)   . High cholesterol 12/2011  . History of blood transfusion 07/2011; 12/2011; 01/2012 X 2; 04/2012  . History of gout   . History of lower GI bleeding   . Hypertension   . Iron deficiency anemia   . Mitral regurgitation    a. Moderate by echo, 02/2012  . Myocardial infarction ([place])   . NSVT (nonsustained ventricular tachycardia) ([place])   . Ovarian cancer ([place]) 1992  . PAF (paroxysmal atrial fibrillation) ([place])   . Pneumonia ~ 2009  . PUD (peptic ulcer disease)   . TIA (transient ischemic attack)    PSH  Past Surgical History:  Procedure Laterality Date  . A/V SHUNTOGRAM Left 03/19/2019   Procedure: A/V SHUNTOGRAM;  Surgeon: Katha Cabal, MD;  Location: [place] CV LAB;  Service: Cardiovascular;  Laterality: Left;  . ABDOMINAL HYSTERECTOMY  1992  . APPENDECTOMY  06/1990  . AV FISTULA PLACEMENT  07/2009   left upper arm  . AV FISTULA PLACEMENT Right 09/06/2016   Procedure: RIGHT FOREARM ARTERIOVENOUS (AV) GRAFT;  Surgeon: Elam Dutch, MD;  Location: [place];  Service: Vascular;  Laterality: Right;  .  AV FISTULA PLACEMENT N/A 02/24/2017   Procedure: INSERTION OF ARTERIOVENOUS (AV) GORE-TEX GRAFT ARM (BRACHIAL AXILLARY);  Surgeon: Katha Cabal, MD;  Location: [facility];  Service: Vascular;  Laterality: N/A;  . [place] Right 09/06/2016   Procedure: REMOVAL OF Right Arm ARTERIOVENOUS GORETEX GRAFT and Vein Patch angioplasty of brachial artery;  Surgeon: Angelia Mould, MD;  Location: [place];  Service: Vascular;  Laterality: Right;  . BIOPSY  09/26/2019   Procedure: BIOPSY;  Surgeon: Rogene Houston, MD;  [facility];  Service: Endoscopy;;  . COLON RESECTION  1992  . COLON SURGERY    . COLONOSCOPY N/A 03/09/2019   Procedure: COLONOSCOPY;  Surgeon: Rogene Houston, MD;  [facility];  Service: Endoscopy;   Laterality: N/A;  . CORONARY ANGIOPLASTY WITH STENT PLACEMENT  12/15/11   "2"  . CORONARY ANGIOPLASTY WITH STENT PLACEMENT  y/2013   "1; makes total of 3" (05/02/2012)  . CORONARY ARTERY BYPASS GRAFT  06/13/2012   Procedure: CORONARY ARTERY BYPASS GRAFTING (CABG);  Surgeon: Grace Isaac, MD;  Location: [place];  Service: Open Heart Surgery;  Laterality: N/A;  cabg x four;  using left internal mammary artery, and left leg greater saphenous vein harvested endoscopically  . CORONARY STENT INTERVENTION N/A 10/13/2016   Procedure: Coronary Stent Intervention;  Surgeon: Troy Sine, MD;  Location: [place] CV LAB;  Service: Cardiovascular;  Laterality: N/A;  . DIALYSIS/PERMA CATHETER REMOVAL N/A 04/18/2017   Procedure: DIALYSIS/PERMA CATHETER REMOVAL;  Surgeon: Katha Cabal, MD;  Location: [place] [facility];  Service: Cardiovascular;  Laterality: N/A;  . DILATION AND CURETTAGE OF UTERUS    . ESOPHAGOGASTRODUODENOSCOPY  01/20/2012   Procedure: ESOPHAGOGASTRODUODENOSCOPY (EGD);  Surgeon: Ladene Artist, MD,FACG;  Location[facility] ENDOSCOPY;  Service: Endoscopy;  Laterality: N/A;  . ESOPHAGOGASTRODUODENOSCOPY N/A 03/26/2013   Procedure: ESOPHAGOGASTRODUODENOSCOPY (EGD);  Surgeon: Irene Shipper, MD;  Location: [facility] ENDOSCOPY;  Service: Endoscopy;  Laterality: N/A;  . ESOPHAGOGASTRODUODENOSCOPY N/A 04/30/2015   Procedure: ESOPHAGOGASTRODUODENOSCOPY (EGD);  Surgeon: Rogene Houston, MD;  [facility];  Service: Endoscopy;  Laterality: N/A;  1pm - moved to 10/20 @ 1:10  . ESOPHAGOGASTRODUODENOSCOPY N/A 07/29/2016   Procedure: ESOPHAGOGASTRODUODENOSCOPY (EGD);  Surgeon: Manus Gunning, MD;  Location: [name];  Service: Gastroenterology;  Laterality: N/A;  enteroscopy  . ESOPHAGOGASTRODUODENOSCOPY N/A 09/26/2019   Procedure: ESOPHAGOGASTRODUODENOSCOPY (EGD);  Surgeon: Rogene Houston, MD;  [facility];  Service: Endoscopy;  Laterality: N/A;  1250  . GIVENS CAPSULE STUDY  N/A 03/07/2019   Procedure: GIVENS CAPSULE STUDY;  Surgeon: Rogene Houston, MD;  [facility];  Service: Endoscopy;  Laterality: N/A;  7:30  . INTRAOPERATIVE TRANSESOPHAGEAL ECHOCARDIOGRAM  06/13/2012   Procedure: INTRAOPERATIVE TRANSESOPHAGEAL ECHOCARDIOGRAM;  Surgeon: Grace Isaac, MD;  Location: [place];  Service: Open Heart Surgery;  Laterality: N/A;  . IR FLUORO GUIDE CV LINE RIGHT  06/17/2020  . IR GENERIC HISTORICAL  07/26/2016   IR FLUORO GUIDE CV LINE RIGHT 07/26/2016 Greggory Keen, MD [facility]  . IR GENERIC HISTORICAL  07/26/2016   IR US GUIDE VASC ACCESS RIGHT 07/26/2016 Greggory Keen, MD [facility]  . IR GENERIC HISTORICAL  08/02/2016   IR US GUIDE VASC ACCESS RIGHT 08/02/2016 Greggory Keen, MD [facility]  . IR GENERIC HISTORICAL  08/02/2016   IR FLUORO GUIDE CV LINE RIGHT 08/02/2016 Greggory Keen, MD [facility]  . IR RADIOLOGY PERIPHERAL GUIDED IV START  03/28/2017  .  IR REMOVAL TUN CV CATH W/O FL  08/11/2020  . IR THROMBECTOMY AV FISTULA W/THROMBOLYSIS INC/SHUNT/IMG LEFT Left 06/17/2020  . IR US GUIDE VASC ACCESS LEFT  06/17/2020  . IR US GUIDE VASC ACCESS RIGHT  03/28/2017  . IR US GUIDE VASC ACCESS RIGHT  06/17/2020  . LEFT HEART CATH AND CORONARY ANGIOGRAPHY N/A 09/20/2016   Procedure: Left Heart Cath and Coronary Angiography;  Surgeon: [name], MD;  Location: [place] CV LAB;  Service: Cardiovascular;  Laterality: N/A;  . LEFT HEART CATH AND CORS/GRAFTS ANGIOGRAPHY N/A 10/13/2016   Procedure: Left Heart Cath and Cors/Grafts Angiography;  Surgeon: Troy Sine, MD;  Location: [place] [facility];  Service: Cardiovascular;  Laterality: N/A;  . LEFT HEART CATH AND CORS/GRAFTS ANGIOGRAPHY N/A 07/13/2018   Procedure: LEFT HEART CATH AND CORS/GRAFTS ANGIOGRAPHY;  Surgeon: Martinique, Peter M, MD;  Location: [place] [facility];  Service: Cardiovascular;  Laterality: N/A;  . LEFT HEART CATHETERIZATION WITH CORONARY ANGIOGRAM N/A 12/15/2011   Procedure: LEFT HEART  CATHETERIZATION WITH CORONARY ANGIOGRAM;  Surgeon: Burnell Blanks, MD;  Location: [facility] CATH LAB;  Service: Cardiovascular;  Laterality: N/A;  . LEFT HEART CATHETERIZATION WITH CORONARY ANGIOGRAM N/A 01/10/2012   Procedure: LEFT HEART CATHETERIZATION WITH CORONARY ANGIOGRAM;  Surgeon: Peter M Martinique, MD;  Location: [facility] CATH LAB;  Service: Cardiovascular;  Laterality: N/A;  . LEFT HEART CATHETERIZATION WITH CORONARY ANGIOGRAM N/A 06/08/2012   Procedure: LEFT HEART CATHETERIZATION WITH CORONARY ANGIOGRAM;  Surgeon: Burnell Blanks, MD;  Location: [facility] CATH LAB;  Service: Cardiovascular;  Laterality: N/A;  . LEFT HEART CATHETERIZATION WITH CORONARY/GRAFT ANGIOGRAM N/A 12/10/2013   Procedure: LEFT HEART CATHETERIZATION WITH Beatrix Fetters;  Surgeon: [name], MD;  Location: [facility] CATH LAB;  Service: Cardiovascular;  Laterality: N/A;  . OVARY SURGERY     ovarian cancer  . POLYPECTOMY  03/09/2019   Procedure: POLYPECTOMY;  Surgeon: Rogene Houston, MD;  [facility];  Service: Endoscopy;;  cecal   . POLYPECTOMY N/A 09/26/2019   Procedure: DUODENAL POLYPECTOMY;  Surgeon: Rogene Houston, MD;  [facility];  Service: Endoscopy;  Laterality: N/A;  . REVISION OF ARTERIOVENOUS GORETEX GRAFT N/A 02/24/2017   Procedure: REVISION OF ARTERIOVENOUS GORETEX GRAFT (RESECTION);  Surgeon: Katha Cabal, MD;  Location: [facility];  Service: Vascular;  Laterality: N/A;  . REVISON OF ARTERIOVENOUS FISTULA Left 06/19/2020   Procedure: REVISION OF LEFT UPPER ARM AV GRAFT WITH INTERPOSITION JUMP GRAFT USING 6MM GORE LIMB;  Surgeon: Marty Heck, MD;  Location: [place];  Service: Vascular;  Laterality: Left;  . SHUNTOGRAM N/A 10/15/2013   Procedure: Fistulogram;  Surgeon: Serafina Mitchell, MD;  Location: [facility] CATH LAB;  Service: Cardiovascular;  Laterality: N/A;  . THROMBECTOMY / ARTERIOVENOUS GRAFT REVISION  2011   left upper arm  . TUBAL LIGATION  1980's  . UPPER  EXTREMITY ANGIOGRAPHY Bilateral 12/06/2016   Procedure: Upper Extremity Angiography;  Surgeon: Katha Cabal, MD;  Location: [place] [facility];  Service: Cardiovascular;  Laterality: Bilateral;  . UPPER EXTREMITY INTERVENTION Left 06/06/2017   Procedure: UPPER EXTREMITY INTERVENTION;  Surgeon: Katha Cabal, MD;  Location: [place] [facility];  Service: Cardiovascular;  Laterality: Left;   FH  Family History  Problem Relation Age of Onset  . Heart disease Mother        Heart Disease before age 65  . Hyperlipidemia Mother   . Hypertension Mother   . Diabetes Mother   .  Heart attack Mother   . Heart disease Father        Heart Disease before age 6  . Hyperlipidemia Father   . Hypertension Father   . Diabetes Father   . Diabetes Sister   . Hypertension Sister   . Diabetes Brother   . Hyperlipidemia Brother   . Heart attack Brother   . Hypertension Sister   . Heart attack Brother   . Colon cancer Child 72  . Other Other        noncontributory for early CAD  . Esophageal cancer Neg Hx   . Liver disease Neg Hx   . Kidney disease Neg Hx   . Colon polyps Neg Hx    SH  reports that she has never smoked. She has never used smokeless tobacco. She reports that she does not drink alcohol and does not use drugs. Allergies  Allergies  Allergen Reactions  . Aspirin Other (See Comments)    High Doses Mess up her stomach; "makes my bowels have blood in them". Takes 81 mg EC Aspirin   . Penicillins Other (See Comments)    SYNCOPE? , "makes me real weak when I take it; like I'll pass out"  Has patient had a PCN reaction causing immediate rash, facial/tongue/throat swelling, SOB or lightheadedness with hypotension: Yes Has patient had a PCN reaction causing severe rash involving mucus membranes or skin necrosis: no Has patient had a PCN reaction that required hospitalization no Has patient had a PCN reaction occurring within the last 10 years: no If all of the above  .  Amlodipine Swelling  . Bactrim [Sulfamethoxazole-Trimethoprim] Rash  . Contrast Media [Iodinated Diagnostic Agents] Itching  . Iron Itching and Other (See Comments)    "they gave me iron in dialysis; had to give me Benadryl cause I had to have the iron" (05/02/2012)  . Nitrofurantoin Hives  . Tylenol [Acetaminophen] Itching and Other (See Comments)    Makes her feet on fire per pt  . Gabapentin Other (See Comments)    Unknown reaction  . Hydralazine Itching  . Ranexa [Ranolazine] Other (See Comments)    Myoclonus-hospitalized   . Dexilant [Dexlansoprazole] Other (See Comments)    Upset stomach  . Levaquin [Levofloxacin In D5w] Rash  . Morphine And Related Itching and Other (See Comments)    Itching in feet  . Plavix [Clopidogrel Bisulfate] Rash  . Protonix [Pantoprazole Sodium] Rash  . Venofer [Ferric Oxide] Itching and Other (See Comments)    Patient reports using Benadryl prior to doses as [facility] medications Prior to Admission medications   Medication Sig Start Date End Date Taking? Authorizing Provider  amiodarone (PACERONE) 200 MG tablet Take 1 tablet (200 mg total) by mouth daily. Starting July 2 12/17/19  Yes Strader, Fransisco Hertz, PA-C  aspirin EC 81 MG tablet Take 81 mg by mouth daily.  09/27/19  Yes Rehman, Mechele Dawley, MD  fluticasone (FLONASE) 50 MCG/ACT nasal spray Place 1 spray into both nostrils at bedtime as needed for allergies.    Yes [provider]  isosorbide mononitrate (IMDUR) 60 MG 24 hr tablet Take 1 tablet (60 mg total) by mouth 2 (two) times daily. 08/10/20 11/08/20 Yes BranchAlphonse Guild, MD  lidocaine-prilocaine (EMLA) cream Apply 1 application topically every Monday, Wednesday, and Friday. Prior to dialysis   Yes [provider]  loratadine (CLARITIN) 10 MG tablet Take 1 tablet (10 mg total) by mouth daily as needed for allergies. 07/14/18  Yes Hosie Poisson, MD  multivitamin (RENA-VIT) TABS tablet Take 1 tablet by mouth daily.   Yes  [provider]  omeprazole (PRILOSEC) 40 MG capsule Take 1 capsule (40 mg total) by mouth daily before breakfast. 03/31/20  Yes Laurine Blazer B, PA-C  sevelamer carbonate (RENVELA) 800 MG tablet Take 2 tablets (1,600 mg total) by mouth 3 (three) times daily with meals. 06/20/20  Yes Meccariello, Bernita Raisin, DO  simvastatin (ZOCOR) 20 MG tablet Take 1 tablet (20 mg total) by mouth at bedtime. 07/07/20  Yes Verta Ellen., NP  nitroGLYCERIN (NITROSTAT) 0.4 MG SL tablet Place 1 tablet (0.4 mg total) under the tongue every 5 (five) minutes x 3 doses as needed for chest pain (if no relief after 2nd dose, proceed to the ED for an evaluation or call 911). 09/08/20   Verta Ellen., NP    Current Medications Scheduled Meds: . amiodarone  200 mg Oral Daily  . vitamin C  500 mg Oral Daily  . aspirin EC  81 mg Oral Daily  . isosorbide mononitrate  60 mg Oral BID  . sevelamer carbonate  1,600 mg Oral TID WC  . simvastatin  20 mg Oral QHS  . zinc sulfate  220 mg Oral Daily   Continuous Infusions: . heparin 700 Units/hr (09/29/20 0512)  . [START ON 09/30/2020] remdesivir 100 mg in NS 100 mL     PRN Meds:.acetaminophen, albuterol, chlorpheniramine-HYDROcodone, fluticasone, guaiFENesin-dextromethorphan, loratadine, nitroGLYCERIN, ondansetron (ZOFRAN) IV  CBC Recent Labs  Lab 09/29/20 0112  WBC 5.6  NEUTROABS 3.6  HGB 9.8*  HCT 33.0*  MCV 123.1*  PLT 759   Basic Metabolic Panel Recent Labs  Lab 09/29/20 0112  NA 139  K 4.3  CL 101  CO2 26  GLUCOSE 99  BUN 27*  CREATININE 5.44*  CALCIUM 9.7    Physical Exam  Blood pressure (!) 197/60, pulse 77, temperature 98.3 F (36.8 C), temperature source Oral, resp. rate 16, height 5\' 1"  (1.549 m), weight 62.7 kg, SpO2 100 %. GEN: NAD, well appearing, pleasant ENT: NCAT EYES: EOMI CV: RRR PULM: CTAB ABD: s/nt/nd SKIN: No rashes/lesions EXT:No LEE VASC: LUE AVF +B/T   Assessment 26F ESRD, sig CAD presented with worsened  anginal CP and incidental ASx COVID+  1. ESRD MWF [name], outpt orders unknown 2. Angina, known CAD, currently CP free. Per [facility]/Cardiology 3. COVID19+, ASx, vaccinated, per [facility] 4. Anemia of ESRD: Hb 9.8, CTM 5. CKD-BMD: trend values, cont outpt meds 6. HTN/Vol: stable at this time, 1-2L UF with HD 7. pAFib  Plan 1. HD tomorrow on schedule if inpatient: 3.5h, AVF, 350/600, 2K, no heparin, 1-2L UF BP permitting 2. She will need to notify HD unit that she is COVID+ so she can receive appropriate outpt HD upon DC 3. Daily weights, Daily Renal Panel, Strict I/Os, Avoid nephrotoxins (NSAIDs, judicious IV Contrast) 4. Will follow along   Shawna [name]  [phone] pgr 09/29/2020, 8:59 AM

## 2020-09-29 NOTE — Discharge Summary (Signed)
Physician Discharge Summary [facility]    Patient: Shawna [name]                   Admit date: 09/29/2020   DOB: Nov 15, 1940             Discharge date:09/29/2020/11:46 AM ZOX:[id number]                          PCP: [facility]  Disposition: HOME   Recommendations for Outpatient Follow-up:   . Follow up: in 2 weeks with cardiologist . As scheduled, continue hemodialysis as scheduled . With PCP in 1-2weeks medication review and adjustment  Discharge Condition: Stable   Code Status:   Code Status: Full Code  Diet recommendation: Renal diet   Discharge Diagnoses:    Active Problems:   Unstable angina [facility])   History of Present Illness/ Hospital Course Shawna [name] Summary:   Shawna [name]  is a 80 y.o. female, with history of TIA, paroxysmal atrial fibrillation, myocardial infarction, mitral regurgitation, high cholesterol, GERD, chronic diastolic CHF, ESRD, coronary artery disease, and more presents the ED with a chief complaint of chest pain.  Patient reports that she has had intermittent chest pain for a long time but has been much worse over the last 2 days.  She reports that the pain starts in the right side of her chest and radiates all the way across to the left side of her chest.  She reports that it also radiates back to between her shoulder blades.  She does not describe the pain as tearing, pressure, stabbing, she reports its "just pain."  She has no associated palpitations, shortness of breath, diaphoresis, nausea.  Nitro relieves the pain.  When the episodes happen they are present for about 10 minutes.  Exertion makes them worse.  Holding her breath makes it better.  This feels different than when she had pain before her stents and CABG.  She reports that her episodes are getting more painful over the last 2 days.  Today she just cannot take it anymore and she had to come to the ER.  Patient does not smoke, does not drink alcohol, does not use illicit  drugs.  She is vaccinated for COVID.  She is full code.  In the ED Temp 98.4, heart rate 67-79, respiratory rate 18-21, blood pressure 170/49 White blood cell count 5.6, hemoglobin 9.8 Chemistry panel reveals an elevated BUN and creatinine in the setting of ESRD Chest x-ray shows interstitial changes and bronchitic features, unchanged from prior.  No acute superimposed cardiopulmonary process. Troponin is 38 and then 43 EKG shows heart rate is 78, sinus rhythm, left bundle branch block, prolonged QT -similar to prior EKGs ED provider requests admission for work-up of unstable angina     Patient was admitted for close observation Chest pain resolved-troponin remained flat Cardiology was consulted-patient was seen and evaluated, hetroponins remain flatr current dose has been increased, No further work-up is recommended at this time.  Incidental positive COVID-19-  asymptomatic, on room air satting 100% Mildly elevated markers, D-dimer 1.0 in setting of end-stage renal disease No concern at this time patient is fully vaccinated with booster, asymptomatic No recommendation for treatment   End-stage renal disease -patient was seen by nephrology, recommend continue hemodialysis Monday Wednesday Friday  Disposition -cleared to be discharged home.   Discharge Instructions:   Discharge Instructions    Activity as tolerated - No restrictions   Complete by:  As directed    Diet - low sodium heart healthy   Complete by: As directed    Discharge instructions   Complete by: As directed    Follow-up with cardiology in 1-2 wks  Follow-up with your nephrologist as scheduled   Increase activity slowly   Complete by: As directed        Medication List    TAKE these medications   amiodarone 200 MG tablet Commonly known as: PACERONE Take 1 tablet (200 mg total) by mouth daily. Starting July 2   aspirin EC 81 MG tablet Take 81 mg by mouth daily.   fluticasone 50 MCG/ACT nasal  spray Commonly known as: FLONASE Place 1 spray into both nostrils at bedtime as needed for allergies.   isosorbide mononitrate 30 MG 24 hr tablet Commonly known as: IMDUR Take 3 tablets (90 mg total) by mouth 2 (two) times daily. What changed:   medication strength  how much to take   lidocaine-prilocaine cream Commonly known as: EMLA Apply 1 application topically every Monday, Wednesday, and Friday. Prior to dialysis   loratadine 10 MG tablet Commonly known as: CLARITIN Take 1 tablet (10 mg total) by mouth daily as needed for allergies.   multivitamin Tabs tablet Take 1 tablet by mouth daily.   nitroGLYCERIN 0.4 MG SL tablet Commonly known as: NITROSTAT Place 1 tablet (0.4 mg total) under the tongue every 5 (five) minutes x 3 doses as needed for chest pain (if no relief after 2nd dose, proceed to the ED for an evaluation or call 911).   omeprazole 40 MG capsule Commonly known as: PRILOSEC Take 1 capsule (40 mg total) by mouth daily before breakfast.   sevelamer carbonate 800 MG tablet Commonly known as: RENVELA Take 2 tablets (1,600 mg total) by mouth 3 (three) times daily with meals.   simvastatin 20 MG tablet Commonly known as: ZOCOR Take 1 tablet (20 mg total) by mouth at bedtime.       Follow-up [place] [place] Follow up.   Why: Call Javier Docker after leaving hospital to determine what clinic you will need to go to since COVID +. Ask for Joy.  Contact information: [place] [address] [phone]        Erma Heritage, PA-C Follow up on 10/20/2020.   Specialties: Physician Assistant, Cardiology Why: Cardiology Hospital Follow-up on 10/20/2020 at 3:30 PM.  Contact information: [place] [address] [phone]              Allergies  Allergen Reactions  . Aspirin Other (See Comments)    High Doses Mess up her stomach; "makes my bowels have blood in them". Takes 81 mg EC  Aspirin   . Penicillins Other (See Comments)    SYNCOPE? , "makes me real weak when I take it; like I'll pass out"  Has patient had a PCN reaction causing immediate rash, facial/tongue/throat swelling, SOB or lightheadedness with hypotension: Yes Has patient had a PCN reaction causing severe rash involving mucus membranes or skin necrosis: no Has patient had a PCN reaction that required hospitalization no Has patient had a PCN reaction occurring within the last 10 years: no If all of the above  . Amlodipine Swelling  . Bactrim [Sulfamethoxazole-Trimethoprim] Rash  . Contrast Media [Iodinated Diagnostic Agents] Itching  . Iron Itching and Other (See Comments)    "they gave me iron in dialysis; had to give me Benadryl cause  I had to have the iron" (05/02/2012)  . Nitrofurantoin Hives  . Tylenol [Acetaminophen] Itching and Other (See Comments)    Makes her feet on fire per pt  . Gabapentin Other (See Comments)    Unknown reaction  . Hydralazine Itching  . Ranexa [Ranolazine] Other (See Comments)    Myoclonus-hospitalized   . Dexilant [Dexlansoprazole] Other (See Comments)    Upset stomach  . Levaquin [Levofloxacin In D5w] Rash  . Morphine And Related Itching and Other (See Comments)    Itching in feet  . Plavix [Clopidogrel Bisulfate] Rash  . Protonix [Pantoprazole Sodium] Rash  . Venofer [Ferric Oxide] Itching and Other (See Comments)    Patient reports using Benadryl prior to doses as [place]     Procedures /Studies:   CT Head Wo Contrast  Result Date: 08/30/2020 CLINICAL DATA:  Neuro deficit, stroke suspected, jerking tremor with spasm since yesterday, fall today head strike on carpeted flooring EXAM: CT HEAD WITHOUT CONTRAST TECHNIQUE: Contiguous axial images were obtained from the base of the skull through the vertex without intravenous contrast. COMPARISON:  CT 01/31/2018 FINDINGS: Brain: No evidence of acute infarction, hemorrhage, hydrocephalus, extra-axial  collection, visible mass lesion or mass effect. Symmetric prominence of the ventricles, cisterns and sulci compatible with parenchymal volume loss. Patchy areas of white matter hypoattenuation are most compatible with chronic microvascular angiopathy. Vascular: Atherosclerotic calcification of the carotid siphons and intradural vertebral arteries. No hyperdense vessel. Skull: No significant scalp swelling or large hematoma. No calvarial fracture or acute osseous abnormalities. Sinuses/Orbits: Paranasal sinuses and mastoid air cells are predominantly clear. Included orbital structures are unremarkable. Other: Debris in the left external auditory canal. IMPRESSION: 1. No acute intracranial abnormality. If there is persisting concern for acute infarction, MRI is more sensitive and specific for early features of ischemia. 2. Chronic microvascular angiopathy and parenchymal volume loss. Intracranial atherosclerosis. 3. Debris in the left external auditory canal, correlate for cerumen impaction. Electronically Signed   By: Lovena Le M.D.   On: 08/30/2020 22:57   DG Chest Portable 1 View  Result Date: 09/29/2020 CLINICAL DATA:  Chest pain for 2 days, worsening, history of CABG, bronchitis, CHF EXAM: PORTABLE CHEST 1 VIEW COMPARISON:  Radiograph 08/30/2020 FINDINGS: Stable cardiomediastinal contours with evidence of prior sternotomy and CABG. Coronary stents are noted as well. Calcified tortuous aorta is similar to prior. Telemetry leads overlie the chest as well.Left axillary/upper extremity vascular stent partially visualized. Some chronically coarsened interstitial changes and bronchitic features are similar to comparison exam. Additional bandlike areas of scarring and/or atelectasis in the lung bases. No acute superimposed consolidative process is seen. No pneumothorax or layering effusion. Degenerative changes are present in the imaged spine and shoulders. IMPRESSION: 1. Chronic interstitial changes and  bronchitic features, unchanged from prior as detailed above. 2. No acute superimposed cardiopulmonary process. 3. Prior sternotomy and CABG.  Stable cardiomegaly. 4.  Aortic Atherosclerosis (ICD10-I70.0). Electronically Signed   By: Lovena Le M.D.   On: 09/29/2020 01:08   DG Chest Port 1 View  Result Date: 08/30/2020 CLINICAL DATA:  Bilateral leg jerking and spasm since yesterday, fall today EXAM: PORTABLE CHEST 1 VIEW COMPARISON:  Radiograph 08/10/2020 FINDINGS: Some chronically coarsened interstitial and bronchitic changes similar to prior studies with some additional bandlike regions of scarring and or atelectasis in both lung bases. Stable prominent cardiomediastinal silhouette with postsurgical changes from prior sternotomy and CABG as well as coronary stenting. The aorta is calcified. The remaining cardiomediastinal contours are unremarkable. No acute  osseous or soft tissue abnormality. Degenerative changes are present in the imaged spine and shoulders. Telemetry leads overlie the chest. Vascular stenting in the left axilla. IMPRESSION: 1. No acute cardiopulmonary abnormality. Chronic findings as described above. 2. Stable postoperative changes from prior sternotomy, CABG and coronary stenting. Electronically Signed   By: Lovena Le M.D.   On: 08/30/2020 23:03   [place] EKG SITE RITE  Result Date: 09/29/2020 If [facility] image not attached, placement could not be confirmed due to current cardiac rhythm.    Subjective:   Patient was seen and examined 09/29/2020, 11:46 AM Patient stable today. No acute distress.  No issues overnight Stable for discharge.  Discharge Exam:    Vitals:   09/29/20 0330 09/29/20 0400 09/29/20 0430 09/29/20 0818  BP: (!) 173/59 (!) 154/46 (!) 141/46 (!) 197/60  Pulse: 71 66 63 77  Resp: 19 20 18 16   Temp:    98.3 F (36.8 C)  TempSrc:    Oral  SpO2: 94% 95% 96% 100%  Weight:    62.7 kg  Height:    5\' 1"  (1.549 m)    General: Pt lying comfortably in bed  & appears in no obvious distress. Cardiovascular: S1 & S2 heard, RRR, S1/S2 +. No murmurs, rubs, gallops or clicks. No JVD or pedal edema. Respiratory: Clear to auscultation without wheezing, rhonchi or crackles. No increased work of breathing. Abdominal:  Non-distended, non-tender & soft. No organomegaly or masses appreciated. Normal bowel sounds heard. CNS: Alert and oriented. No focal deficits. Extremities: no edema, no cyanosis      The results of significant diagnostics from this hospitalization (including imaging, microbiology, ancillary and laboratory) are listed below for reference.      Microbiology:   Recent Results (from the past 240 hour(s))  Resp Panel by RT-PCR (Flu A&B, Covid) Nasopharyngeal Swab     Status: Abnormal   Collection Time: 09/29/20  3:21 AM   Specimen: Nasopharyngeal Swab; Nasopharyngeal(NP) swabs in vial transport medium  Result Value Ref Range Status   SARS Coronavirus 2 by RT PCR POSITIVE (A) NEGATIVE Final    Comment: RESULT CALLED TO, READ BACK BY AND VERIFIED WITH: K NICHOLS,RN@[id number]  09/29/20 MKELLY (NOTE) SARS-CoV-2 target nucleic acids are DETECTED.  The SARS-CoV-2 RNA is generally detectable in upper respiratory specimens during the acute phase of infection. Positive results are indicative of the presence of the identified virus, but do not rule out bacterial infection or co-infection with other pathogens not detected by the test. Clinical correlation with patient history and other diagnostic information is necessary to determine patient infection status. The expected result is Negative.  Fact Sheet for Patients: EntrepreneurPulse.com.au  Fact Sheet for Healthcare Providers: IncredibleEmployment.be  This test is not yet approved or cleared by the [place] FDA and  has been authorized for detection and/or diagnosis of SARS-CoV-2 by FDA under an Emergency Use Authorization (EUA).  This EUA  will remain in effect (meaning this test can be  used) for the duration of  the COVID-19 declaration under Section 564(b)(1) of the Act, 21 U.S.C. section 360bbb-3(b)(1), unless the authorization is terminated or revoked sooner.     Influenza A by PCR NEGATIVE NEGATIVE Final   Influenza B by PCR NEGATIVE NEGATIVE Final    Comment: (NOTE) The Xpert Xpress SARS-CoV-2/FLU/RSV plus assay is intended as an aid in the diagnosis of influenza from Nasopharyngeal swab specimens and should not be used as a sole basis for treatment. Nasal washings and aspirates are unacceptable for Xpert  Xpress SARS-CoV-2/FLU/RSV testing.  Fact Sheet for Patients: EntrepreneurPulse.com.au  Fact Sheet for Healthcare Providers: IncredibleEmployment.be  This test is not yet approved or cleared by the [place] FDA and has been authorized for detection and/or diagnosis of SARS-CoV-2 by FDA under an Emergency Use Authorization (EUA). This EUA will remain in effect (meaning this test can be used) for the duration of the COVID-19 declaration under Section 564(b)(1) of the Act, 21 U.S.C. section 360bbb-3(b)(1), unless the authorization is terminated or revoked.  Performed at [facility], [address], [place], [place] [address]      Labs:   CBC: Recent Labs  Lab 09/29/20 0112  WBC 5.6  NEUTROABS 3.6  HGB 9.8*  HCT 33.0*  MCV 123.1*  PLT 756   Basic Metabolic Panel: Recent Labs  Lab 09/29/20 0112  NA 139  K 4.3  CL 101  CO2 26  GLUCOSE 99  BUN 27*  CREATININE 5.44*  CALCIUM 9.7   Liver Function Tests: No results for input(s): AST, ALT, ALKPHOS, BILITOT, PROT, ALBUMIN in the last 168 hours. BNP (last 3 results) Recent Labs    09/29/20 0112  BNP 161.0*   Cardiac Enzymes: No results for input(s): CKTOTAL, CKMB, CKMBINDEX, TROPONINI in the last 168 hours. CBG: No results for input(s): GLUCAP in the last 168 hours. Hgb A1c No results for  input(s): HGBA1C in the last 72 hours. Lipid Profile Recent Labs    09/29/20 0656  CHOL 122  HDL 63  LDLCALC 50  TRIG 45  CHOLHDL 1.9   Thyroid function studies No results for input(s): TSH, T4TOTAL, T3FREE, THYROIDAB in the last 72 hours.  Invalid input(s): FREET3 Anemia work up Recent Labs    09/29/20 0112  FERRITIN 506*   Urinalysis    Component Value Date/Time   COLORURINE YELLOW 12/16/2012 1919   APPEARANCEUR CLOUDY (A) 12/16/2012 1919   LABSPEC 1.009 12/16/2012 1919   PHURINE 7.5 12/16/2012 1919   GLUCOSEU NEGATIVE 12/16/2012 1919   HGBUR TRACE (A) 12/16/2012 1919   BILIRUBINUR NEGATIVE 12/16/2012 1919   KETONESUR NEGATIVE 12/16/2012 1919   PROTEINUR 100 (A) 12/16/2012 1919   UROBILINOGEN 0.2 12/16/2012 1919   NITRITE NEGATIVE 12/16/2012 1919   LEUKOCYTESUR SMALL (A) 12/16/2012 1919         Time coordinating discharge: Over 45 minutes  SIGNED: Deatra James, MD, FACP, FHM. [facility],  Please use [email or web address] to Page If 7PM-7AM, please contact night-coverage [email or web address] 09/29/2020, 11:46 AM

## 2020-09-29 NOTE — H&P (Signed)
TRH H&P    Patient Demographics:    Shawna [name], is a 80 y.o. female  MRN: [id number]  DOB - December 27, 1940  Admit Date - 09/29/2020  Referring MD/NP/PA: [place]  Outpatient Primary MD for the patient is [facility]  Patient coming from: Home  Chief complaint- Chest pain   HPI:    Shawna [name]  is a 80 y.o. female, with history of TIA, paroxysmal atrial fibrillation, myocardial infarction, mitral regurgitation, high cholesterol, GERD, chronic diastolic CHF, ESRD, coronary artery disease, and more presents the ED with a chief complaint of chest pain.  Patient reports that she has had intermittent chest pain for a long time but has been much worse over the last 2 days.  She reports that the pain starts in the right side of her chest and radiates all the way across to the left side of her chest.  She reports that it also radiates back to between her shoulder blades.  She does not describe the pain as tearing, pressure, stabbing, she reports its "just pain."  She has no associated palpitations, shortness of breath, diaphoresis, nausea.  Nitro relieves the pain.  When the episodes happen they are present for about 10 minutes.  Exertion makes them worse.  Holding her breath makes it better.  This feels different than when she had pain before her stents and CABG.  She reports that her episodes are getting more painful over the last 2 days.  Today she just cannot take it anymore and she had to come to the ER.  Patient does not smoke, does not drink alcohol, does not use illicit drugs.  She is vaccinated for COVID.  She is full code.  In the ED Temp 98.4, heart rate 67-79, respiratory rate 18-21, blood pressure 170/49 White blood cell count 5.6, hemoglobin 9.8 Chemistry panel reveals an elevated BUN and creatinine in the setting of ESRD Chest x-ray shows interstitial changes and bronchitic features, unchanged from  prior.  No acute superimposed cardiopulmonary process. Troponin is 38 and then 43 EKG shows heart rate is 78, sinus rhythm, left bundle branch block, prolonged QT -similar to prior EKGs ED provider requests admission for work-up of unstable angina    Review of systems:    In addition to the HPI above,  No Fever-chills, No Headache, No changes with Vision or hearing, No problems swallowing food or Liquids, Admits to chest pain but no cough or shortness of breath No Abdominal pain, No Nausea or Vomiting, bowel movements are regular, No Blood in stool or Urine, No dysuria, No new skin rashes or bruises, No new joints pains-aches,  No new weakness, tingling, numbness in any extremity, No recent weight gain or loss, No polyuria, polydypsia or polyphagia, No significant Mental Stressors.  All other systems reviewed and are negative.    Past History of the following :    Past Medical History:  Diagnosis Date  . Acute on chronic respiratory failure with hypoxia ([place]) 10/10/2016  . Anxiety   . Arthritis   . AVM (arteriovenous malformation) of  colon   . CAD (coronary artery disease)    a. s/p CABG in 2013 b. DES to D1 in 10/2016. c. cath in 07/2018 showing patent grafts with occlusion of D1 at prior stent site and progression of PDA disease --> medical management recommended  . Carotid artery disease ([place])    a. 95-62% LICA, 07/3084   . Chronic anemia   . Chronic bronchitis ([place])   . Chronic diastolic CHF (congestive heart failure) ([place])    a. 02/2012 Echo EF 60-65%, nl wall motion, Gr 1 DD, mod MR  . Colon cancer ([place]) 1992  . Esophageal stricture   . ESRD on hemodialysis ([place])    ESRD due to HTN, started dialysis 2011 and gets HD at [facility] with Dr Hinda Lenis on MWF schedule.  Access is LUA AVF as of Sept 2014.   Marland Kitchen GERD (gastroesophageal reflux disease)   . High cholesterol 12/2011  . History of blood transfusion 07/2011; 12/2011; 01/2012 X 2; 04/2012  . History of gout   .  History of lower GI bleeding   . Hypertension   . Iron deficiency anemia   . Mitral regurgitation    a. Moderate by echo, 02/2012  . Myocardial infarction ([place])   . NSVT (nonsustained ventricular tachycardia) ([place])   . Ovarian cancer ([place]) 1992  . PAF (paroxysmal atrial fibrillation) ([place])   . Pneumonia ~ 2009  . PUD (peptic ulcer disease)   . TIA (transient ischemic attack)       Past Surgical History:  Procedure Laterality Date  . A/V SHUNTOGRAM Left 03/19/2019   Procedure: A/V SHUNTOGRAM;  Surgeon: Katha Cabal, MD;  Location: [place] [facility];  Service: Cardiovascular;  Laterality: Left;  . ABDOMINAL HYSTERECTOMY  1992  . APPENDECTOMY  06/1990  . AV FISTULA PLACEMENT  07/2009   left upper arm  . AV FISTULA PLACEMENT Right 09/06/2016   Procedure: RIGHT FOREARM ARTERIOVENOUS (AV) GRAFT;  Surgeon: Elam Dutch, MD;  Location: [facility] OR;  Service: Vascular;  Laterality: Right;  . AV FISTULA PLACEMENT N/A 02/24/2017   Procedure: INSERTION OF ARTERIOVENOUS (AV) GORE-TEX GRAFT ARM (BRACHIAL AXILLARY);  Surgeon: Katha Cabal, MD;  Location: [facility] ORS;  Service: Vascular;  Laterality: N/A;  . [place] Right 09/06/2016   Procedure: REMOVAL OF Right Arm ARTERIOVENOUS GORETEX GRAFT and Vein Patch angioplasty of brachial artery;  Surgeon: Angelia Mould, MD;  Location: [place];  Service: Vascular;  Laterality: Right;  . BIOPSY  09/26/2019   Procedure: BIOPSY;  Surgeon: Rogene Houston, MD;  [facility];  Service: Endoscopy;;  . COLON RESECTION  1992  . COLON SURGERY    . COLONOSCOPY N/A 03/09/2019   Procedure: COLONOSCOPY;  Surgeon: Rogene Houston, MD;  [facility];  Service: Endoscopy;  Laterality: N/A;  . CORONARY ANGIOPLASTY WITH STENT PLACEMENT  12/15/11   "2"  . CORONARY ANGIOPLASTY WITH STENT PLACEMENT  y/2013   "1; makes total of 3" (05/02/2012)  . CORONARY ARTERY BYPASS GRAFT  06/13/2012   Procedure: CORONARY ARTERY BYPASS GRAFTING (CABG);   Surgeon: Grace Isaac, MD;  Location: [place];  Service: Open Heart Surgery;  Laterality: N/A;  cabg x four;  using left internal mammary artery, and left leg greater saphenous vein harvested endoscopically  . CORONARY STENT INTERVENTION N/A 10/13/2016   Procedure: Coronary Stent Intervention;  Surgeon: Troy Sine, MD;  Location: [place] [facility];  Service: Cardiovascular;  Laterality: N/A;  . DIALYSIS/PERMA CATHETER  REMOVAL N/A 04/18/2017   Procedure: DIALYSIS/PERMA CATHETER REMOVAL;  Surgeon: Katha Cabal, MD;  Location: [place] [facility];  Service: Cardiovascular;  Laterality: N/A;  . DILATION AND CURETTAGE OF UTERUS    . ESOPHAGOGASTRODUODENOSCOPY  01/20/2012   Procedure: ESOPHAGOGASTRODUODENOSCOPY (EGD);  Surgeon: Ladene Artist, MD,FACG;  Location: [facility] ENDOSCOPY;  Service: Endoscopy;  Laterality: N/A;  . ESOPHAGOGASTRODUODENOSCOPY N/A 03/26/2013   Procedure: ESOPHAGOGASTRODUODENOSCOPY (EGD);  Surgeon: Irene Shipper, MD;  Location: [facility] ENDOSCOPY;  Service: Endoscopy;  Laterality: N/A;  . ESOPHAGOGASTRODUODENOSCOPY N/A 04/30/2015   Procedure: ESOPHAGOGASTRODUODENOSCOPY (EGD);  Surgeon: Rogene Houston, MD;  [facility];  Service: Endoscopy;  Laterality: N/A;  1pm - moved to 10/20 @ 1:10  . ESOPHAGOGASTRODUODENOSCOPY N/A 07/29/2016   Procedure: ESOPHAGOGASTRODUODENOSCOPY (EGD);  Surgeon: Manus Gunning, MD;  Location: [place];  Service: Gastroenterology;  Laterality: N/A;  enteroscopy  . ESOPHAGOGASTRODUODENOSCOPY N/A 09/26/2019   Procedure: ESOPHAGOGASTRODUODENOSCOPY (EGD);  Surgeon: Rogene Houston, MD;  [facility];  Service: Endoscopy;  Laterality: N/A;  1250  . GIVENS CAPSULE STUDY N/A 03/07/2019   Procedure: GIVENS CAPSULE STUDY;  Surgeon: Rogene Houston, MD;  [facility];  Service: Endoscopy;  Laterality: N/A;  7:30  . INTRAOPERATIVE TRANSESOPHAGEAL ECHOCARDIOGRAM  06/13/2012   Procedure: INTRAOPERATIVE TRANSESOPHAGEAL  ECHOCARDIOGRAM;  Surgeon: Grace Isaac, MD;  Location: [place];  Service: Open Heart Surgery;  Laterality: N/A;  . IR FLUORO GUIDE CV LINE RIGHT  06/17/2020  . IR GENERIC HISTORICAL  07/26/2016   IR FLUORO GUIDE CV LINE RIGHT 07/26/2016 Greggory Keen, MD [facility]  . IR GENERIC HISTORICAL  07/26/2016   IR US GUIDE VASC ACCESS RIGHT 07/26/2016 Greggory Keen, MD [facility]  . IR GENERIC HISTORICAL  08/02/2016   IR US GUIDE VASC ACCESS RIGHT 08/02/2016 Greggory Keen, MD [facility]  . IR GENERIC HISTORICAL  08/02/2016   IR FLUORO GUIDE CV LINE RIGHT 08/02/2016 Greggory Keen, MD [facility]  . IR RADIOLOGY PERIPHERAL GUIDED IV START  03/28/2017  . IR REMOVAL TUN CV CATH W/O FL  08/11/2020  . IR THROMBECTOMY AV FISTULA W/THROMBOLYSIS INC/SHUNT/IMG LEFT Left 06/17/2020  . IR US GUIDE VASC ACCESS LEFT  06/17/2020  . IR US GUIDE VASC ACCESS RIGHT  03/28/2017  . IR US GUIDE VASC ACCESS RIGHT  06/17/2020  . LEFT HEART CATH AND CORONARY ANGIOGRAPHY N/A 09/20/2016   Procedure: Left Heart Cath and Coronary Angiography;  Surgeon: [name], MD;  Location: [place] [facility];  Service: Cardiovascular;  Laterality: N/A;  . LEFT HEART CATH AND CORS/GRAFTS ANGIOGRAPHY N/A 10/13/2016   Procedure: Left Heart Cath and Cors/Grafts Angiography;  Surgeon: Troy Sine, MD;  Location: [place] [facility];  Service: Cardiovascular;  Laterality: N/A;  . LEFT HEART CATH AND CORS/GRAFTS ANGIOGRAPHY N/A 07/13/2018   Procedure: LEFT HEART CATH AND CORS/GRAFTS ANGIOGRAPHY;  Surgeon: Martinique, Peter M, MD;  Location: [place] [facility];  Service: Cardiovascular;  Laterality: N/A;  . LEFT HEART CATHETERIZATION WITH CORONARY ANGIOGRAM N/A 12/15/2011   Procedure: LEFT HEART CATHETERIZATION WITH CORONARY ANGIOGRAM;  Surgeon: Burnell Blanks, MD;  Location: [facility] CATH LAB;  Service: Cardiovascular;  Laterality: N/A;  . LEFT HEART CATHETERIZATION WITH CORONARY ANGIOGRAM N/A 01/10/2012   Procedure: LEFT HEART CATHETERIZATION WITH  CORONARY ANGIOGRAM;  Surgeon: Peter M Martinique, MD;  Location: [facility] CATH LAB;  Service: Cardiovascular;  Laterality: N/A;  . LEFT HEART CATHETERIZATION WITH CORONARY ANGIOGRAM N/A 06/08/2012   Procedure: LEFT HEART CATHETERIZATION  WITH CORONARY ANGIOGRAM;  Surgeon: Burnell Blanks, MD;  Location: [facility] CATH LAB;  Service: Cardiovascular;  Laterality: N/A;  . LEFT HEART CATHETERIZATION WITH CORONARY/GRAFT ANGIOGRAM N/A 12/10/2013   Procedure: LEFT HEART CATHETERIZATION WITH Beatrix Fetters;  Surgeon: [name], MD;  Location: [facility] CATH LAB;  Service: Cardiovascular;  Laterality: N/A;  . OVARY SURGERY     ovarian cancer  . POLYPECTOMY  03/09/2019   Procedure: POLYPECTOMY;  Surgeon: Rogene Houston, MD;  [facility];  Service: Endoscopy;;  cecal   . POLYPECTOMY N/A 09/26/2019   Procedure: DUODENAL POLYPECTOMY;  Surgeon: Rogene Houston, MD;  [facility];  Service: Endoscopy;  Laterality: N/A;  . REVISION OF ARTERIOVENOUS GORETEX GRAFT N/A 02/24/2017   Procedure: REVISION OF ARTERIOVENOUS GORETEX GRAFT (RESECTION);  Surgeon: Katha Cabal, MD;  Location: [facility] ORS;  Service: Vascular;  Laterality: N/A;  . REVISON OF ARTERIOVENOUS FISTULA Left 06/19/2020   Procedure: REVISION OF LEFT UPPER ARM AV GRAFT WITH INTERPOSITION JUMP GRAFT USING 6MM GORE LIMB;  Surgeon: Marty Heck, MD;  Location: [place];  Service: Vascular;  Laterality: Left;  . SHUNTOGRAM N/A 10/15/2013   Procedure: Fistulogram;  Surgeon: Serafina Mitchell, MD;  Location: [facility] CATH LAB;  Service: Cardiovascular;  Laterality: N/A;  . THROMBECTOMY / ARTERIOVENOUS GRAFT REVISION  2011   left upper arm  . TUBAL LIGATION  1980's  . UPPER EXTREMITY ANGIOGRAPHY Bilateral 12/06/2016   Procedure: Upper Extremity Angiography;  Surgeon: Katha Cabal, MD;  Location: [facility];  Service: Cardiovascular;  Laterality: Bilateral;  . UPPER EXTREMITY INTERVENTION Left 06/06/2017   Procedure:  UPPER EXTREMITY INTERVENTION;  Surgeon: Katha Cabal, MD;  Location: [place] [facility];  Service: Cardiovascular;  Laterality: Left;      Social History:      Social History   Tobacco Use  . Smoking status: Never Smoker  . Smokeless tobacco: Never Used  Substance Use Topics  . Alcohol use: No    Alcohol/week: 0.0 standard drinks       Family History :     Family History  Problem Relation Age of Onset  . Heart disease Mother        Heart Disease before age 6  . Hyperlipidemia Mother   . Hypertension Mother   . Diabetes Mother   . Heart attack Mother   . Heart disease Father        Heart Disease before age 88  . Hyperlipidemia Father   . Hypertension Father   . Diabetes Father   . Diabetes Sister   . Hypertension Sister   . Diabetes Brother   . Hyperlipidemia Brother   . Heart attack Brother   . Hypertension Sister   . Heart attack Brother   . Colon cancer Child 60  . Other Other        noncontributory for early CAD  . Esophageal cancer Neg Hx   . Liver disease Neg Hx   . Kidney disease Neg Hx   . Colon polyps Neg Hx       Home Medications:   Prior to Admission medications   Medication Sig Start Date End Date Taking? Authorizing Provider  albuterol (VENTOLIN HFA) 108 (90 Base) MCG/ACT inhaler Inhale 2 puffs into the lungs every 6 (six) hours as needed for wheezing or shortness of breath.    [provider]  amiodarone (PACERONE) 200 MG tablet Take 1 tablet (200 mg total) by mouth daily. Starting  July 2 12/17/19   Ahmed Prima, Fransisco Hertz, PA-C  aspirin EC 81 MG tablet Take 81 mg by mouth daily.  09/27/19   Rehman, Mechele Dawley, MD  dicyclomine (BENTYL) 10 MG capsule Take 10 mg by mouth 2 (two) times daily.    [provider]  fluconazole (DIFLUCAN) 100 MG tablet Take 100 mg by mouth daily. 08/17/20   [provider]  fluticasone (FLONASE) 50 MCG/ACT nasal spray Place 1 spray into both nostrils at bedtime as needed for allergies.      [provider]  isosorbide mononitrate (IMDUR) 60 MG 24 hr tablet Take 1 tablet (60 mg total) by mouth 2 (two) times daily. 08/10/20 11/08/20  Arnoldo Lenis, MD  ketoconazole (NIZORAL) 2 % cream Apply 1 application topically 3 (three) times daily. Patient not taking: No sig reported 08/17/20   [provider]  lidocaine-prilocaine (EMLA) cream Apply 1 application topically every Monday, Wednesday, and Friday. Prior to dialysis    [provider]  loratadine (CLARITIN) 10 MG tablet Take 1 tablet (10 mg total) by mouth daily as needed for allergies. 07/14/18   Hosie Poisson, MD  multivitamin (RENA-VIT) TABS tablet Take 1 tablet by mouth daily.    [provider]  nitroGLYCERIN (NITROSTAT) 0.4 MG SL tablet Place 1 tablet (0.4 mg total) under the tongue every 5 (five) minutes x 3 doses as needed for chest pain (if no relief after 2nd dose, proceed to the ED for an evaluation or call 911). 09/08/20   Verta Ellen., NP  omeprazole (PRILOSEC) 40 MG capsule Take 1 capsule (40 mg total) by mouth daily before breakfast. 03/31/20   Laurine Blazer B, PA-C  sevelamer carbonate (RENVELA) 800 MG tablet Take 2 tablets (1,600 mg total) by mouth 3 (three) times daily with meals. 06/20/20   Meccariello, Bernita Raisin, DO  simvastatin (ZOCOR) 20 MG tablet Take 1 tablet (20 mg total) by mouth at bedtime. 07/07/20   Verta Ellen., NP     Allergies:     Allergies  Allergen Reactions  . Aspirin Other (See Comments)    High Doses Mess up her stomach; "makes my bowels have blood in them". Takes 81 mg EC Aspirin   . Penicillins Other (See Comments)    SYNCOPE? , "makes me real weak when I take it; like I'll pass out"  Has patient had a PCN reaction causing immediate rash, facial/tongue/throat swelling, SOB or lightheadedness with hypotension: Yes Has patient had a PCN reaction causing severe rash involving mucus membranes or skin necrosis: no Has patient had a PCN reaction that  required hospitalization no Has patient had a PCN reaction occurring within the last 10 years: no If all of the above  . Amlodipine Swelling  . Bactrim [Sulfamethoxazole-Trimethoprim] Rash  . Contrast Media [Iodinated Diagnostic Agents] Itching  . Iron Itching and Other (See Comments)    "they gave me iron in dialysis; had to give me Benadryl cause I had to have the iron" (05/02/2012)  . Nitrofurantoin Hives  . Tylenol [Acetaminophen] Itching and Other (See Comments)    Makes her feet on fire per pt  . Gabapentin Other (See Comments)    Unknown reaction  . Hydralazine Itching  . Ranexa [Ranolazine] Other (See Comments)    Myoclonus-hospitalized   . Dexilant [Dexlansoprazole] Other (See Comments)    Upset stomach  . Levaquin [Levofloxacin In D5w] Rash  . Morphine And Related Itching and Other (See Comments)    Itching in feet  .  Plavix [Clopidogrel Bisulfate] Rash  . Protonix [Pantoprazole Sodium] Rash  . Venofer [Ferric Oxide] Itching and Other (See Comments)    Patient reports using Benadryl prior to doses as [facility]     Physical Exam:   Vitals  Blood pressure (!) 141/46, pulse 63, temperature 98.4 F (36.9 C), temperature source Oral, resp. rate 18, height 5\' 1"  (1.549 m), weight 61.2 kg, SpO2 96 %.  1.  General: Patient lying supine in bed, asleep upon entry into room, no acute distress  2. Psychiatric:  Behavior normal for situation, oriented x3, pleasant, cooperative with exam  3. Neurologic: Speech and language are normal, face is symmetric, moves all 4 extremities voluntarily, no acute deficit on limited exam  4. HEENMT:  Head is atraumatic, normocephalic, pupils reactive to light, neck is supple, trachea is midline, mucous membranes are moist  5. Respiratory : Lungs are clear to auscultation bilaterally without wheezing, rhonchi, rales, no clubbing, no cyanosis  6. Cardiovascular : Heart rate is normal, rhythm is regular, systolic murmur, no rubs or  gallops, trace edema at ankles, peripheral pulses palpated  7. Gastrointestinal:  Abdomen is soft, nondistended, nontender to palpation  8. Skin:  Skin is warm dry and intact without acute lesion on limited exam  9.Musculoskeletal:  No calf tenderness, no acute deformity, peripheral pulses palpated    Data Review:    CBC Recent Labs  Lab 09/29/20 0112  WBC 5.6  HGB 9.8*  HCT 33.0*  PLT 210  MCV 123.1*  MCH 36.6*  MCHC 29.7*  RDW 19.2*  LYMPHSABS 0.9  MONOABS 0.6  EOSABS 0.3  BASOSABS 0.1   ------------------------------------------------------------------------------------------------------------------  Results for orders placed or performed during the hospital encounter of 09/29/20 (from the past 48 hour(s))  CBC with Differential/Platelet     Status: Abnormal   Collection Time: 09/29/20  1:12 AM  Result Value Ref Range   WBC 5.6 4.0 - 10.5 K/uL   RBC 2.68 (L) 3.87 - 5.11 MIL/uL   Hemoglobin 9.8 (L) 12.0 - 15.0 g/dL   HCT 33.0 (L) 36.0 - 46.0 %   MCV 123.1 (H) 80.0 - 100.0 fL   MCH 36.6 (H) 26.0 - 34.0 pg   MCHC 29.7 (L) 30.0 - 36.0 g/dL   RDW 19.2 (H) 11.5 - 15.5 %   Platelets 210 150 - 400 K/uL   nRBC 0.0 0.0 - 0.2 %   Neutrophils Relative % 65 %   Neutro Abs 3.6 1.7 - 7.7 K/uL   Lymphocytes Relative 17 %   Lymphs Abs 0.9 0.7 - 4.0 K/uL   Monocytes Relative 11 %   Monocytes Absolute 0.6 0.1 - 1.0 K/uL   Eosinophils Relative 5 %   Eosinophils Absolute 0.3 0.0 - 0.5 K/uL   Basophils Relative 1 %   Basophils Absolute 0.1 0.0 - 0.1 K/uL   Immature Granulocytes 1 %   Abs Immature Granulocytes 0.03 0.00 - 0.07 K/uL    Comment: Performed at [facility], [address], [place], [place] [address]  Basic metabolic panel     Status: Abnormal   Collection Time: 09/29/20  1:12 AM  Result Value Ref Range   Sodium 139 135 - 145 mmol/L   Potassium 4.3 3.5 - 5.1 mmol/L   Chloride 101 98 - 111 mmol/L   CO2 26 22 - 32 mmol/L   Glucose, Bld 99 70 - 99 mg/dL     Comment: Glucose reference range applies only to samples taken after fasting for at least  8 hours.   BUN 27 (H) 8 - 23 mg/dL   Creatinine, Ser 5.44 (H) 0.44 - 1.00 mg/dL   Calcium 9.7 8.9 - 10.3 mg/dL   GFR, Estimated 7 (L) >60 mL/min    Comment: (NOTE) Calculated using the CKD-EPI Creatinine Equation (2021)    Anion gap 12 5 - 15    Comment: Performed at [facility], [address] [place], [place] [address]  Troponin I (High Sensitivity)     Status: Abnormal   Collection Time: 09/29/20  1:12 AM  Result Value Ref Range   Troponin I (High Sensitivity) 38 (H) <18 ng/L    Comment: (NOTE) Elevated high sensitivity troponin I (hsTnI) values and significant  changes across serial measurements may suggest ACS but many other  chronic and acute conditions are known to elevate hsTnI results.  Refer to the "Links" section for chest pain algorithms and additional  guidance. Performed at [facility], [address], [place], [place] [address]   Brain natriuretic peptide     Status: Abnormal   Collection Time: 09/29/20  1:12 AM  Result Value Ref Range   B Natriuretic Peptide 161.0 (H) 0.0 - 100.0 pg/mL    Comment: Performed at [facility], [address] [place], [place] [address]  Troponin I (High Sensitivity)     Status: Abnormal   Collection Time: 09/29/20  2:42 AM  Result Value Ref Range   Troponin I (High Sensitivity) 43 (H) <18 ng/L    Comment: (NOTE) Elevated high sensitivity troponin I (hsTnI) values and significant  changes across serial measurements may suggest ACS but many other  chronic and acute conditions are known to elevate hsTnI results.  Refer to the "Links" section for chest pain algorithms and additional  guidance. Performed at [facility], [address] [place], [place] [address]   Resp Panel by RT-PCR (Flu A&B, Covid) Nasopharyngeal Swab     Status: Abnormal   Collection Time: 09/29/20  3:21 AM   Specimen: Nasopharyngeal Swab; Nasopharyngeal(NP) swabs in vial  transport medium  Result Value Ref Range   SARS Coronavirus 2 by RT PCR POSITIVE (A) NEGATIVE    Comment: RESULT CALLED TO, READ BACK BY AND VERIFIED WITH: K NICHOLS,RN@[id number]  09/29/20 MKELLY (NOTE) SARS-CoV-2 target nucleic acids are DETECTED.  The SARS-CoV-2 RNA is generally detectable in upper respiratory specimens during the acute phase of infection. Positive results are indicative of the presence of the identified virus, but do not rule out bacterial infection or co-infection with other pathogens not detected by the test. Clinical correlation with patient history and other diagnostic information is necessary to determine patient infection status. The expected result is Negative.  Fact Sheet for Patients: EntrepreneurPulse.com.au  Fact Sheet for Healthcare Providers: IncredibleEmployment.be  This test is not yet approved or cleared by the [place] FDA and  has been authorized for detection and/or diagnosis of SARS-CoV-2 by FDA under an Emergency Use Authorization (EUA).  This EUA will remain in effect (meaning this test can be  used) for the duration of  the COVID-19 declaration under Section 564(b)(1) of the Act, 21 U.S.C. section 360bbb-3(b)(1), unless the authorization is terminated or revoked sooner.     Influenza A by PCR NEGATIVE NEGATIVE   Influenza B by PCR NEGATIVE NEGATIVE    Comment: (NOTE) The Xpert Xpress SARS-CoV-2/FLU/RSV plus assay is intended as an aid in the diagnosis of influenza from Nasopharyngeal swab specimens and should not be used as a sole basis for treatment. Nasal washings and aspirates  are unacceptable for Xpert Xpress SARS-CoV-2/FLU/RSV testing.  Fact Sheet for Patients: EntrepreneurPulse.com.au  Fact Sheet for Healthcare Providers: IncredibleEmployment.be  This test is not yet approved or cleared by the [place] FDA and has been authorized for detection  and/or diagnosis of SARS-CoV-2 by FDA under an Emergency Use Authorization (EUA). This EUA will remain in effect (meaning this test can be used) for the duration of the COVID-19 declaration under Section 564(b)(1) of the Act, 21 U.S.C. section 360bbb-3(b)(1), unless the authorization is terminated or revoked.  Performed at [facility], [address] [place], [place] [address]    *Note: Due to a large number of results and/or encounters for the requested time period, some results have not been displayed. A complete set of results can be found in Results Review.    Chemistries  Recent Labs  Lab 09/29/20 0112  NA 139  K 4.3  CL 101  CO2 26  GLUCOSE 99  BUN 27*  CREATININE 5.44*  CALCIUM 9.7   ------------------------------------------------------------------------------------------------------------------  ------------------------------------------------------------------------------------------------------------------ GFR: Estimated Creatinine Clearance: 6.9 mL/min (A) (by C-G formula based on SCr of 5.44 mg/dL (H)). Liver Function Tests: No results for input(s): AST, ALT, ALKPHOS, BILITOT, PROT, ALBUMIN in the last 168 hours. No results for input(s): LIPASE, AMYLASE in the last 168 hours. No results for input(s): AMMONIA in the last 168 hours. Coagulation Profile: No results for input(s): INR, PROTIME in the last 168 hours. Cardiac Enzymes: No results for input(s): CKTOTAL, CKMB, CKMBINDEX, TROPONINI in the last 168 hours. BNP (last 3 results) No results for input(s): PROBNP in the last 8760 hours. HbA1C: No results for input(s): HGBA1C in the last 72 hours. CBG: No results for input(s): GLUCAP in the last 168 hours. Lipid Profile: No results for input(s): CHOL, HDL, LDLCALC, TRIG, CHOLHDL, LDLDIRECT in the last 72 hours. Thyroid Function Tests: No results for input(s): TSH, T4TOTAL, FREET4, T3FREE, THYROIDAB in the last 72 hours. Anemia Panel: No results for  input(s): VITAMINB12, FOLATE, FERRITIN, TIBC, IRON, RETICCTPCT in the last 72 hours.  --------------------------------------------------------------------------------------------------------------- Urine analysis:    Component Value Date/Time   COLORURINE YELLOW 12/16/2012 1919   APPEARANCEUR CLOUDY (A) 12/16/2012 1919   LABSPEC 1.009 12/16/2012 1919   PHURINE 7.5 12/16/2012 1919   GLUCOSEU NEGATIVE 12/16/2012 1919   HGBUR TRACE (A) 12/16/2012 1919   BILIRUBINUR NEGATIVE 12/16/2012 1919   KETONESUR NEGATIVE 12/16/2012 1919   PROTEINUR 100 (A) 12/16/2012 1919   UROBILINOGEN 0.2 12/16/2012 1919   NITRITE NEGATIVE 12/16/2012 1919   LEUKOCYTESUR SMALL (A) 12/16/2012 1919      Imaging Results:    DG Chest Portable 1 View  Result Date: 09/29/2020 CLINICAL DATA:  Chest pain for 2 days, worsening, history of CABG, bronchitis, CHF EXAM: PORTABLE CHEST 1 VIEW COMPARISON:  Radiograph 08/30/2020 FINDINGS: Stable cardiomediastinal contours with evidence of prior sternotomy and CABG. Coronary stents are noted as well. Calcified tortuous aorta is similar to prior. Telemetry leads overlie the chest as well.Left axillary/upper extremity vascular stent partially visualized. Some chronically coarsened interstitial changes and bronchitic features are similar to comparison exam. Additional bandlike areas of scarring and/or atelectasis in the lung bases. No acute superimposed consolidative process is seen. No pneumothorax or layering effusion. Degenerative changes are present in the imaged spine and shoulders. IMPRESSION: 1. Chronic interstitial changes and bronchitic features, unchanged from prior as detailed above. 2. No acute superimposed cardiopulmonary process. 3. Prior sternotomy and CABG.  Stable cardiomegaly. 4.  Aortic Atherosclerosis (ICD10-I70.0). Electronically Signed   By: Lovena Le  M.D.   On: 09/29/2020 01:08    My personal review of EKG: Rhythm NSR, Rate 78/min, QTc 521, left bundle branch  block   Assessment & Plan:    Active Problems:   Unstable angina (HCC)   1. Unstable angina 1. Continue aspirin, statin, Imdur 2. Continue therapeutic heparin 3. Consult cardiology 4. Monitor on telemetry 5. Continue cycle troponins, repeat EKG 6. Nitro for acute chest pain 7. Patient is chest pain-free at time of admission 2. COVID 19 1. Incidental finding 2. Start remdesivir 3. Draw inflammatory markers 4. Continue to monitor 3. Prolonged QT 1. Monitor on telemetry 2. Avoid QT prolonging agents when possible 4. ESRD 1. Consult nephrology 2. Dialysis schedule is Monday Wednesday Friday 5. Paroxysmal atrial fibrillation 1. Continue amiodarone  2. Monitor on telemetry 6.    DVT Prophylaxis-   Heparin- SCDs   AM Labs Ordered, also please review Full Orders  Family Communication:  Code Status: Full  Admission status: Observation Time spent in minutes : [place] DO

## 2020-09-29 NOTE — ED Triage Notes (Signed)
Pt c/o chest pain x 2 days. She states she took 1 nitro tab 2 hours ago with some relief.

## 2020-09-29 NOTE — Consult Note (Signed)
Cardiology Consultation:   Patient ID: [name] MRN: [id number]; DOB: Dec 05, 1940  Admit date: 09/29/2020 Date of Consult: 09/29/2020  PCP:  [facility], [facility]  Cardiologist: Previously Dr. Bronson Ing  Patient Profile:   Shawna [name] is a 80 y.o. female with past medical history of CAD (s/p CABG in 2013,DES to D1 in 10/2016 with cath showing patent LIMA-LAD, SVG-RI, SVG-Mrg and SVG-RCA, cath in 07/2018 showingpatentgrafts with occlusion of D1 at prior stent site and progression of PDA disease --> medical management recommended), chronic diastolic CHF, carotid artery disease, HTN, HLD, paroxysmal atrial fibrillation (not on anticoagulation given history of GIB),NSVT (on Amiodarone)and ESRD  who is being seen today for the evaluation of chest pain at the request of Dr. Clearence Ped.  History of Present Illness:   Shawna [name] was last examined by Katina Dung, NP in 08/2020 and reported still having intermittent episodes of chest pain during HD which would typically resolve with SL NTG x1. She was on Imdur 60mg  BID and was started on Ranexa 500mg  BID.  Was admitted in 08/2020 for muscle spasms and falls. Was evaluated by Nephrology and felt Ranexa and Gabapentin could be contributing, therefore they were both discontinued at discharge.   She presented back to the ED on 09/28/2020 for evaluation of chest pain for the past 2 days. She describes episodes of shooting chest pain which started on Sunday evening and would radiate into her back at times. Had trouble catching her breath when this would occur but no specific orthopnea or PND. Her chest pain would improve with SL NTG. Was occurring while sitting and not associated with exertion. No reported fever or chills. She denies any recent lower extremity edema. Was previously having hypotension during HD sessions but says this has been stable. No recurrent falls or muscle spasms since Ranexa was  discontinued.   Initial labs show WBC 5.6, Hgb 9.8, platelets 210, Na+ 139, K+ 4.3 and creatinine 5.44. BNP 161. D-dimer 1.00. Initial HS Troponin 38 with repeat values of 43 and 45. COVID positive. CXR showing chronic interstitial changes and bronchitic features with no acute findings. EKG shows NSR, HR 78 with known LBBB.    Past Medical History:  Diagnosis Date  . Acute on chronic respiratory failure with hypoxia ([place]) 10/10/2016  . Anxiety   . Arthritis   . AVM (arteriovenous malformation) of colon   . CAD (coronary artery disease)    a. s/p CABG in 2013 b. DES to D1 in 10/2016. c. cath in 07/2018 showing patent grafts with occlusion of D1 at prior stent site and progression of PDA disease --> medical management recommended  . Carotid artery disease ([place])    a. 62-26% LICA, 09/3352   . Chronic anemia   . Chronic bronchitis ([place])   . Chronic diastolic CHF (congestive heart failure) ([place])    a. 02/2012 Echo EF 60-65%, nl wall motion, Gr 1 DD, mod MR  . Colon cancer ([place]) 1992  . Esophageal stricture   . ESRD on hemodialysis ([place])    ESRD due to HTN, started dialysis 2011 and gets HD at [facility] with Dr Hinda Lenis on MWF schedule.  Access is LUA AVF as of Sept 2014.   Marland Kitchen GERD (gastroesophageal reflux disease)   . High cholesterol 12/2011  . History of blood transfusion 07/2011; 12/2011; 01/2012 X 2; 04/2012  . History of gout   . History of lower GI bleeding   . Hypertension   .  Iron deficiency anemia   . Mitral regurgitation    a. Moderate by echo, 02/2012  . Myocardial infarction ([place])   . NSVT (nonsustained ventricular tachycardia) ([place])   . Ovarian cancer ([place]) 1992  . PAF (paroxysmal atrial fibrillation) ([place])   . Pneumonia ~ 2009  . PUD (peptic ulcer disease)   . TIA (transient ischemic attack)     Past Surgical History:  Procedure Laterality Date  . A/V SHUNTOGRAM Left 03/19/2019   Procedure: A/V SHUNTOGRAM;  Surgeon: Katha Cabal, MD;  Location: [place] [facility];   Service: Cardiovascular;  Laterality: Left;  . ABDOMINAL HYSTERECTOMY  1992  . APPENDECTOMY  06/1990  . AV FISTULA PLACEMENT  07/2009   left upper arm  . AV FISTULA PLACEMENT Right 09/06/2016   Procedure: RIGHT FOREARM ARTERIOVENOUS (AV) GRAFT;  Surgeon: Elam Dutch, MD;  Location: [facility] OR;  Service: Vascular;  Laterality: Right;  . AV FISTULA PLACEMENT N/A 02/24/2017   Procedure: INSERTION OF ARTERIOVENOUS (AV) GORE-TEX GRAFT ARM (BRACHIAL AXILLARY);  Surgeon: Katha Cabal, MD;  Location: [facility] ORS;  Service: Vascular;  Laterality: N/A;  . [place] Right 09/06/2016   Procedure: REMOVAL OF Right Arm ARTERIOVENOUS GORETEX GRAFT and Vein Patch angioplasty of brachial artery;  Surgeon: Angelia Mould, MD;  Location: [place];  Service: Vascular;  Laterality: Right;  . BIOPSY  09/26/2019   Procedure: BIOPSY;  Surgeon: Rogene Houston, MD;  [facility];  Service: Endoscopy;;  . COLON RESECTION  1992  . COLON SURGERY    . COLONOSCOPY N/A 03/09/2019   Procedure: COLONOSCOPY;  Surgeon: Rogene Houston, MD;  [facility];  Service: Endoscopy;  Laterality: N/A;  . CORONARY ANGIOPLASTY WITH STENT PLACEMENT  12/15/11   "2"  . CORONARY ANGIOPLASTY WITH STENT PLACEMENT  y/2013   "1; makes total of 3" (05/02/2012)  . CORONARY ARTERY BYPASS GRAFT  06/13/2012   Procedure: CORONARY ARTERY BYPASS GRAFTING (CABG);  Surgeon: Grace Isaac, MD;  Location: [place];  Service: Open Heart Surgery;  Laterality: N/A;  cabg x four;  using left internal mammary artery, and left leg greater saphenous vein harvested endoscopically  . CORONARY STENT INTERVENTION N/A 10/13/2016   Procedure: Coronary Stent Intervention;  Surgeon: Troy Sine, MD;  Location: [place] [facility];  Service: Cardiovascular;  Laterality: N/A;  . DIALYSIS/PERMA CATHETER REMOVAL N/A 04/18/2017   Procedure: DIALYSIS/PERMA CATHETER REMOVAL;  Surgeon: Katha Cabal, MD;  Location: [place] [facility];  Service:  Cardiovascular;  Laterality: N/A;  . DILATION AND CURETTAGE OF UTERUS    . ESOPHAGOGASTRODUODENOSCOPY  01/20/2012   Procedure: ESOPHAGOGASTRODUODENOSCOPY (EGD);  Surgeon: Ladene Artist, MD,FACG;  Location: [facility] ENDOSCOPY;  Service: Endoscopy;  Laterality: N/A;  . ESOPHAGOGASTRODUODENOSCOPY N/A 03/26/2013   Procedure: ESOPHAGOGASTRODUODENOSCOPY (EGD);  Surgeon: Irene Shipper, MD;  Location: [facility] ENDOSCOPY;  Service: Endoscopy;  Laterality: N/A;  . ESOPHAGOGASTRODUODENOSCOPY N/A 04/30/2015   Procedure: ESOPHAGOGASTRODUODENOSCOPY (EGD);  Surgeon: Rogene Houston, MD;  [facility];  Service: Endoscopy;  Laterality: N/A;  1pm - moved to 10/20 @ 1:10  . ESOPHAGOGASTRODUODENOSCOPY N/A 07/29/2016   Procedure: ESOPHAGOGASTRODUODENOSCOPY (EGD);  Surgeon: Manus Gunning, MD;  Location: [place];  Service: Gastroenterology;  Laterality: N/A;  enteroscopy  . ESOPHAGOGASTRODUODENOSCOPY N/A 09/26/2019   Procedure: ESOPHAGOGASTRODUODENOSCOPY (EGD);  Surgeon: Rogene Houston, MD;  [facility];  Service: Endoscopy;  Laterality: N/A;  1250  . GIVENS CAPSULE STUDY N/A 03/07/2019   Procedure: GIVENS  CAPSULE STUDY;  Surgeon: Rogene Houston, MD;  [facility];  Service: Endoscopy;  Laterality: N/A;  7:30  . INTRAOPERATIVE TRANSESOPHAGEAL ECHOCARDIOGRAM  06/13/2012   Procedure: INTRAOPERATIVE TRANSESOPHAGEAL ECHOCARDIOGRAM;  Surgeon: Grace Isaac, MD;  Location: [place];  Service: Open Heart Surgery;  Laterality: N/A;  . IR FLUORO GUIDE CV LINE RIGHT  06/17/2020  . IR GENERIC HISTORICAL  07/26/2016   IR FLUORO GUIDE CV LINE RIGHT 07/26/2016 Greggory Keen, MD [facility]  . IR GENERIC HISTORICAL  07/26/2016   IR US GUIDE VASC ACCESS RIGHT 07/26/2016 Greggory Keen, MD [facility]  . IR GENERIC HISTORICAL  08/02/2016   IR US GUIDE VASC ACCESS RIGHT 08/02/2016 Greggory Keen, MD [facility]  . IR GENERIC HISTORICAL  08/02/2016   IR FLUORO GUIDE CV LINE RIGHT 08/02/2016 Greggory Keen, MD [facility]  . IR RADIOLOGY PERIPHERAL GUIDED IV START  03/28/2017  . IR REMOVAL TUN CV CATH W/O FL  08/11/2020  . IR THROMBECTOMY AV FISTULA W/THROMBOLYSIS INC/SHUNT/IMG LEFT Left 06/17/2020  . IR US GUIDE VASC ACCESS LEFT  06/17/2020  . IR US GUIDE VASC ACCESS RIGHT  03/28/2017  . IR US GUIDE VASC ACCESS RIGHT  06/17/2020  . LEFT HEART CATH AND CORONARY ANGIOGRAPHY N/A 09/20/2016   Procedure: Left Heart Cath and Coronary Angiography;  Surgeon: [name], MD;  Location: [place] [facility];  Service: Cardiovascular;  Laterality: N/A;  . LEFT HEART CATH AND CORS/GRAFTS ANGIOGRAPHY N/A 10/13/2016   Procedure: Left Heart Cath and Cors/Grafts Angiography;  Surgeon: Troy Sine, MD;  Location: [place] [facility];  Service: Cardiovascular;  Laterality: N/A;  . LEFT HEART CATH AND CORS/GRAFTS ANGIOGRAPHY N/A 07/13/2018   Procedure: LEFT HEART CATH AND CORS/GRAFTS ANGIOGRAPHY;  Surgeon: Martinique, Peter M, MD;  Location: [place] [facility];  Service: Cardiovascular;  Laterality: N/A;  . LEFT HEART CATHETERIZATION WITH CORONARY ANGIOGRAM N/A 12/15/2011   Procedure: LEFT HEART CATHETERIZATION WITH CORONARY ANGIOGRAM;  Surgeon: Burnell Blanks, MD;  Location: [facility] CATH LAB;  Service: Cardiovascular;  Laterality: N/A;  . LEFT HEART CATHETERIZATION WITH CORONARY ANGIOGRAM N/A 01/10/2012   Procedure: LEFT HEART CATHETERIZATION WITH CORONARY ANGIOGRAM;  Surgeon: Peter M Martinique, MD;  Location: [facility] CATH LAB;  Service: Cardiovascular;  Laterality: N/A;  . LEFT HEART CATHETERIZATION WITH CORONARY ANGIOGRAM N/A 06/08/2012   Procedure: LEFT HEART CATHETERIZATION WITH CORONARY ANGIOGRAM;  Surgeon: Burnell Blanks, MD;  Location: [facility] CATH LAB;  Service: Cardiovascular;  Laterality: N/A;  . LEFT HEART CATHETERIZATION WITH CORONARY/GRAFT ANGIOGRAM N/A 12/10/2013   Procedure: LEFT HEART CATHETERIZATION WITH Beatrix Fetters;  Surgeon: [name], MD;  Location: [facility] CATH LAB;  Service:  Cardiovascular;  Laterality: N/A;  . OVARY SURGERY     ovarian cancer  . POLYPECTOMY  03/09/2019   Procedure: POLYPECTOMY;  Surgeon: Rogene Houston, MD;  [facility];  Service: Endoscopy;;  cecal   . POLYPECTOMY N/A 09/26/2019   Procedure: DUODENAL POLYPECTOMY;  Surgeon: Rogene Houston, MD;  [facility];  Service: Endoscopy;  Laterality: N/A;  . REVISION OF ARTERIOVENOUS GORETEX GRAFT N/A 02/24/2017   Procedure: REVISION OF ARTERIOVENOUS GORETEX GRAFT (RESECTION);  Surgeon: Katha Cabal, MD;  Location: [facility] ORS;  Service: Vascular;  Laterality: N/A;  . REVISON OF ARTERIOVENOUS FISTULA Left 06/19/2020   Procedure: REVISION OF LEFT UPPER ARM AV GRAFT WITH INTERPOSITION JUMP GRAFT USING 6MM GORE LIMB;  Surgeon: Marty Heck, MD;  Location: [place];  Service:  Vascular;  Laterality: Left;  . SHUNTOGRAM N/A 10/15/2013   Procedure: Fistulogram;  Surgeon: Serafina Mitchell, MD;  Location: [facility] CATH LAB;  Service: Cardiovascular;  Laterality: N/A;  . THROMBECTOMY / ARTERIOVENOUS GRAFT REVISION  2011   left upper arm  . TUBAL LIGATION  1980's  . UPPER EXTREMITY ANGIOGRAPHY Bilateral 12/06/2016   Procedure: Upper Extremity Angiography;  Surgeon: Katha Cabal, MD;  Location: [place] [facility];  Service: Cardiovascular;  Laterality: Bilateral;  . UPPER EXTREMITY INTERVENTION Left 06/06/2017   Procedure: UPPER EXTREMITY INTERVENTION;  Surgeon: Katha Cabal, MD;  Location: [place] [facility];  Service: Cardiovascular;  Laterality: Left;     Home Medications:  Prior to Admission medications   Medication Sig Start Date End Date Taking? Authorizing Provider  amiodarone (PACERONE) 200 MG tablet Take 1 tablet (200 mg total) by mouth daily. Starting July 2 12/17/19  Yes Strader, Fransisco Hertz, PA-C  aspirin EC 81 MG tablet Take 81 mg by mouth daily.  09/27/19  Yes Rehman, Mechele Dawley, MD  fluticasone (FLONASE) 50 MCG/ACT nasal spray Place 1 spray into both nostrils at  bedtime as needed for allergies.    Yes [provider]  isosorbide mononitrate (IMDUR) 60 MG 24 hr tablet Take 1 tablet (60 mg total) by mouth 2 (two) times daily. 08/10/20 11/08/20 Yes BranchAlphonse Guild, MD  lidocaine-prilocaine (EMLA) cream Apply 1 application topically every Monday, Wednesday, and Friday. Prior to dialysis   Yes [provider]  loratadine (CLARITIN) 10 MG tablet Take 1 tablet (10 mg total) by mouth daily as needed for allergies. 07/14/18  Yes Hosie Poisson, MD  multivitamin (RENA-VIT) TABS tablet Take 1 tablet by mouth daily.   Yes [provider]  omeprazole (PRILOSEC) 40 MG capsule Take 1 capsule (40 mg total) by mouth daily before breakfast. 03/31/20  Yes Laurine Blazer B, PA-C  sevelamer carbonate (RENVELA) 800 MG tablet Take 2 tablets (1,600 mg total) by mouth 3 (three) times daily with meals. 06/20/20  Yes Meccariello, Bernita Raisin, DO  simvastatin (ZOCOR) 20 MG tablet Take 1 tablet (20 mg total) by mouth at bedtime. 07/07/20  Yes Verta Ellen., NP  nitroGLYCERIN (NITROSTAT) 0.4 MG SL tablet Place 1 tablet (0.4 mg total) under the tongue every 5 (five) minutes x 3 doses as needed for chest pain (if no relief after 2nd dose, proceed to the ED for an evaluation or call 911). 09/08/20   Verta Ellen., NP    Inpatient Medications: Scheduled Meds: . amiodarone  200 mg Oral Daily  . vitamin C  500 mg Oral Daily  . aspirin EC  81 mg Oral Daily  . isosorbide mononitrate  60 mg Oral BID  . sevelamer carbonate  1,600 mg Oral TID WC  . simvastatin  20 mg Oral QHS  . zinc sulfate  220 mg Oral Daily   Continuous Infusions: . heparin 700 Units/hr (09/29/20 0512)  . [START ON 09/30/2020] remdesivir 100 mg in NS 100 mL     PRN Meds: acetaminophen, albuterol, chlorpheniramine-HYDROcodone, fluticasone, guaiFENesin-dextromethorphan, loratadine, nitroGLYCERIN, ondansetron (ZOFRAN) IV  Allergies:    Allergies  Allergen Reactions  . Aspirin Other (See  Comments)    High Doses Mess up her stomach; "makes my bowels have blood in them". Takes 81 mg EC Aspirin   . Penicillins Other (See Comments)    SYNCOPE? , "makes me real weak when I take it; like I'll pass out"  Has patient had a PCN reaction  causing immediate rash, facial/tongue/throat swelling, SOB or lightheadedness with hypotension: Yes Has patient had a PCN reaction causing severe rash involving mucus membranes or skin necrosis: no Has patient had a PCN reaction that required hospitalization no Has patient had a PCN reaction occurring within the last 10 years: no If all of the above  . Amlodipine Swelling  . Bactrim [Sulfamethoxazole-Trimethoprim] Rash  . Contrast Media [Iodinated Diagnostic Agents] Itching  . Iron Itching and Other (See Comments)    "they gave me iron in dialysis; had to give me Benadryl cause I had to have the iron" (05/02/2012)  . Nitrofurantoin Hives  . Tylenol [Acetaminophen] Itching and Other (See Comments)    Makes her feet on fire per pt  . Gabapentin Other (See Comments)    Unknown reaction  . Hydralazine Itching  . Ranexa [Ranolazine] Other (See Comments)    Myoclonus-hospitalized   . Dexilant [Dexlansoprazole] Other (See Comments)    Upset stomach  . Levaquin [Levofloxacin In D5w] Rash  . Morphine And Related Itching and Other (See Comments)    Itching in feet  . Plavix [Clopidogrel Bisulfate] Rash  . Protonix [Pantoprazole Sodium] Rash  . Venofer [Ferric Oxide] Itching and Other (See Comments)    Patient reports using Benadryl prior to doses as [place] History:   Social History   Socioeconomic History  . Marital status: Married    Spouse name: [name]  . Number of children: Not on file  . Years of education: Not on file  . Highest education level: Not on file  Occupational History  . Not on file  Tobacco Use  . Smoking status: Never Smoker  . Smokeless tobacco: Never Used  Vaping Use  . Vaping Use: Never used   Substance and Sexual Activity  . Alcohol use: No    Alcohol/week: 0.0 standard drinks  . Drug use: No  . Sexual activity: Yes    Birth control/protection: Surgical  Other Topics Concern  . Not on file  Social History Narrative   Lives in [place], [place] with husband.  Dialysis pt - [facility].   Social Determinants of Health   Financial Resource Strain: Not on file  Food Insecurity: Not on file  Transportation Needs: Not on file  Physical Activity: Not on file  Stress: Not on file  Social Connections: Not on file  Intimate Partner Violence: Not on file    Family History:    Family History  Problem Relation Age of Onset  . Heart disease Mother        Heart Disease before age 44  . Hyperlipidemia Mother   . Hypertension Mother   . Diabetes Mother   . Heart attack Mother   . Heart disease Father        Heart Disease before age 12  . Hyperlipidemia Father   . Hypertension Father   . Diabetes Father   . Diabetes Sister   . Hypertension Sister   . Diabetes Brother   . Hyperlipidemia Brother   . Heart attack Brother   . Hypertension Sister   . Heart attack Brother   . Colon cancer Child 18  . Other Other        noncontributory for early CAD  . Esophageal cancer Neg Hx   . Liver disease Neg Hx   . Kidney disease Neg Hx   . Colon polyps Neg Hx      ROS:  Please see the history of present illness.  All other ROS reviewed and negative.     Physical Exam/Data:   Vitals:   09/29/20 0330 09/29/20 0400 09/29/20 0430 09/29/20 0818  BP: (!) 173/59 (!) 154/46 (!) 141/46 (!) 197/60  Pulse: 71 66 63 77  Resp: 19 20 18 16   Temp:    98.3 F (36.8 C)  TempSrc:    Oral  SpO2: 94% 95% 96% 100%  Weight:    62.7 kg  Height:    5\' 1"  (1.549 m)   No intake or output data in the 24 hours ending 09/29/20 1009 Last 3 Weights 09/29/2020 09/29/2020 09/02/2020  Weight (lbs) 138 lb 3.7 oz 135 lb 140 lb 6.9 oz  Weight (kg) 62.7 kg 61.236 kg 63.7 kg     Body mass index is 26.12 kg/m.   General:  Well nourished, well developed, in no acute distress. HEENT: normal Lymph: no adenopathy Neck: no JVD Endocrine:  No thryomegaly Vascular: No carotid bruits; FA pulses 2+ bilaterally without bruits  Cardiac:  normal S1, S2; RRR; no murmur.  Lungs:  clear to auscultation bilaterally, no wheezing, rhonchi or rales  Abd: soft, nontender, no hepatomegaly  Ext: no lower extremity edema Musculoskeletal:  No deformities, BUE and BLE strength normal and equal Skin: warm and dry  Neuro:  CNs 2-12 intact, no focal abnormalities noted Psych:  Normal affect   EKG:  The EKG was personally reviewed and demonstrates: NSR, HR 78 with known LBBB.   Telemetry:  Telemetry was personally reviewed and demonstrates: NSR, HR in 70's to 80's. (just connected to telemetry around 0900).   Relevant CV Studies:  Cardiac Catheterization: 07/2018  Ost RPDA to RPDA lesion is 75% stenosed.  Mid LM to Dist LM lesion is 60% stenosed.  Ost Ramus to Ramus lesion is 80% stenosed.  Ost Cx to Mid Cx lesion is 99% stenosed.  Prox LAD lesion is 90% stenosed.  Ost 1st Diag to 1st Diag lesion is 100% stenosed.  Ost RCA to Prox RCA lesion is 100% stenosed.  LIMA graft was visualized by angiography and is large.  The graft exhibits no disease.  SVG graft was visualized by angiography and is large.  The graft exhibits no disease.  SVG graft was visualized by angiography and is normal in caliber.  SVG graft was visualized by angiography and is large.  The graft exhibits no disease.  The left ventricular systolic function is normal.  LV end diastolic pressure is mildly elevated.  The left ventricular ejection fraction is 55-65% by visual estimate.   1. Severe 3 vessel occlusive CAD    - 60% distal left main    - 90% mid LAD    - 100% first diagonal at prior stent site    - 99% proximal LCx at prior stent site    - 80% ramus intermediate.    - 100% ostial RCA at prior stent site    - 75%  proximal PDA 2. Patent LIMA to the LAD 3. Patent SVG to ramus intermediate 4. Patent SVG to OM 5. Patent SVG to the distal RCA. 6. Good LV function 7. Mildly elevated LVEDP  Plan: Compared to prior cath in April 2018 the diagonal is now occluded. There is progression of disease in the PDA. It is unclear if this is the reason for her chest pain. She is severely hypertensive and EDP is mildly elevated. I would recommend medical management with good BP control. If she has refractory angina despite good BP and volume management we  could consider PCI of the PDA but this would have to be done through the SVG and in general she has not responded well to stenting in the past with all prior stents occluded. I will increase her Coreg dose. She will need dialysis in am.    Echocardiogram: 12/2018 IMPRESSIONS    1. The left ventricle has normal systolic function with an ejection  fraction of 60-65%. The cavity size was normal. There is mild concentric  left ventricular hypertrophy. Left ventricular diastolic Doppler  parameters are consistent with impaired  relaxation. Elevated left ventricular end-diastolic pressure No evidence  of left ventricular regional wall motion abnormalities.  2. Left atrial size was mildly dilated.  3. The mitral valve is grossly normal. Mild thickening of the mitral  valve leaflet. There is mild mitral annular calcification present. Mitral  valve regurgitation is mild to moderate by color flow Doppler.  4. The tricuspid valve is grossly normal.  5. The aortic valve is tricuspid. Mild thickening of the aortic valve.  6. Moderate aortic annular calcification.  7. The aortic root is normal in size and structure.    Laboratory Data:  High Sensitivity Troponin:   Recent Labs  Lab 09/29/20 0112 09/29/20 0242 09/29/20 0656  TROPONINIHS 38* 43* 45*     Chemistry Recent Labs  Lab 09/29/20 0112  NA 139  K 4.3  CL 101  CO2 26  GLUCOSE 99  BUN 27*   CREATININE 5.44*  CALCIUM 9.7  GFRNONAA 7*  ANIONGAP 12    No results for input(s): PROT, ALBUMIN, AST, ALT, ALKPHOS, BILITOT in the last 168 hours. Hematology Recent Labs  Lab 09/29/20 0112  WBC 5.6  RBC 2.68*  HGB 9.8*  HCT 33.0*  MCV 123.1*  MCH 36.6*  MCHC 29.7*  RDW 19.2*  PLT 210   BNP Recent Labs  Lab 09/29/20 0112  BNP 161.0*    DDimer  Recent Labs  Lab 09/29/20 0112  DDIMER 1.00*     Radiology/Studies:  DG Chest Portable 1 View  Result Date: 09/29/2020 CLINICAL DATA:  Chest pain for 2 days, worsening, history of CABG, bronchitis, CHF EXAM: PORTABLE CHEST 1 VIEW COMPARISON:  Radiograph 08/30/2020 FINDINGS: Stable cardiomediastinal contours with evidence of prior sternotomy and CABG. Coronary stents are noted as well. Calcified tortuous aorta is similar to prior. Telemetry leads overlie the chest as well.Left axillary/upper extremity vascular stent partially visualized. Some chronically coarsened interstitial changes and bronchitic features are similar to comparison exam. Additional bandlike areas of scarring and/or atelectasis in the lung bases. No acute superimposed consolidative process is seen. No pneumothorax or layering effusion. Degenerative changes are present in the imaged spine and shoulders. IMPRESSION: 1. Chronic interstitial changes and bronchitic features, unchanged from prior as detailed above. 2. No acute superimposed cardiopulmonary process. 3. Prior sternotomy and CABG.  Stable cardiomegaly. 4.  Aortic Atherosclerosis (ICD10-I70.0). Electronically Signed   By: Lovena Le M.D.   On: 09/29/2020 01:08   [place] EKG SITE RITE  Result Date: 09/29/2020 If [facility] image not attached, placement could not be confirmed due to current cardiac rhythm.    Assessment and Plan:   1. Chest Pain with Mixed Features - She has experienced angina for many years and reports episodes of intermittent stabbing chest pain for the past two days which spontaneously  resolved overnight. Episodes previously occurring at rest but did improve with SL NTG.  -  Initial HS Troponin 38 with repeat values of 43 and 45. D-dimer 1.00 in the  setting of COVID-19 so will defer the need for further evaluation to the admitting team. Her EKG shows her known LBBB which limits interpretation.  - At this time, she is pain-free. Options are limited in regards to further evaluation at this time given her COVID positive status so will pursue medical management currently. She did have patent grafts by cath in 07/2018 as outlined above.  - Continue ASA and statin therapy (LDL at 50 this admission). She has been on Imdur 60mg  BID. Will attempt to titrate to 90mg  BID. Previously did not tolerate Ranexa as outlined above and developed hypotension with BB therapy in the past as an outpatient and during her admission in 06/2020 (tried on Lopressor 25mg  BID). Could consider re-challenging with 12.5mg  BID in the future if BP allows as she has been hypertensive this admission but develops significant hypotension with HD). Will arrange for outpatient follow-up in 2-3 weeks and at that time would consider further titiration of medical therapy or may need to consider re-look cath pending how she is doing following medication adjustments.   2. CAD - She is s/p CABG in 2013 withDES to D1 in 10/2016 with cath showing patent LIMA-LAD, SVG-RI, SVG-Mrg and SVG-RCA and repeat cath in 07/2018 showingpatentgrafts with occlusion of D1 at prior stent site and progression of PDA disease and medical management recommended. - No plans for repeat ischemic evaluation at this time in the setting of her COVID-positive status.  - Continue ASA 81mg  daily and Simvastatin 20mg  daily. Will titrate Imdur as outlined above. She was previously unable to tolerate low-dose Coreg or Lopressor as these caused hypotension but could re-challenge in the future as outlined above.    3. Chronic Diastolic CHF - BNP at 347 this  admission. No reported orthopnea, PND or edema and she appears euvolemic by examination today. Volume management by HD.   4. Paroxysmal Atrial Fibrillation/NSVT - She has been in NSR this admission and denies any recent palpitations. Remains on Amiodarone 200mg  daily.  - This patients CHA2DS2-VASc Score and unadjusted Ischemic Stroke Rate (% per year) is equal to 11.2 % stroke rate/year from a score of 7 (HTN, Vascular, Female, Age (2), TIA (2)). By review of notes, she has not on anticoagulation given history of GIB. Remains on ASA.   5. ESRD - Nephrology following and planning on HD tomorrow if still admitted.   6. Positive for COVID-19 - Appears asymptomatic.Oxygen saturations remain stable on RA. She has been started on Remdesivir. Further management per the admitting team.    For questions or updates, please contact [place] Please consult [email or web address] for contact info under    Signed, Erma Heritage, PA-C  09/29/2020 10:09 AM

## 2020-10-03 ENCOUNTER — Encounter (HOSPITAL_COMMUNITY): Payer: Self-pay | Admitting: Emergency Medicine

## 2020-10-03 ENCOUNTER — Other Ambulatory Visit: Payer: Self-pay

## 2020-10-03 ENCOUNTER — Emergency Department (HOSPITAL_COMMUNITY): Payer: Medicare HMO

## 2020-10-03 ENCOUNTER — Inpatient Hospital Stay (HOSPITAL_COMMUNITY)
Admission: EM | Admit: 2020-10-03 | Discharge: 2020-10-07 | DRG: 314 | Disposition: A | Discharge status: Home / Self Care | Payer: Medicare HMO | Source: Ambulatory Visit | Attending: Family Medicine | Admitting: Family Medicine

## 2020-10-03 DIAGNOSIS — Z8249 Family history of ischemic heart disease and other diseases of the circulatory system: Secondary | ICD-10-CM

## 2020-10-03 DIAGNOSIS — D631 Anemia in chronic kidney disease: Secondary | ICD-10-CM | POA: Diagnosis present

## 2020-10-03 DIAGNOSIS — R55 Syncope and collapse: Secondary | ICD-10-CM | POA: Diagnosis present

## 2020-10-03 DIAGNOSIS — Z955 Presence of coronary angioplasty implant and graft: Secondary | ICD-10-CM

## 2020-10-03 DIAGNOSIS — I340 Nonrheumatic mitral (valve) insufficiency: Secondary | ICD-10-CM | POA: Diagnosis present

## 2020-10-03 DIAGNOSIS — Z90710 Acquired absence of both cervix and uterus: Secondary | ICD-10-CM

## 2020-10-03 DIAGNOSIS — Z951 Presence of aortocoronary bypass graft: Secondary | ICD-10-CM

## 2020-10-03 DIAGNOSIS — T82898A Other specified complication of vascular prosthetic devices, implants and grafts, initial encounter: Secondary | ICD-10-CM | POA: Diagnosis not present

## 2020-10-03 DIAGNOSIS — N186 End stage renal disease: Secondary | ICD-10-CM | POA: Diagnosis present

## 2020-10-03 DIAGNOSIS — I480 Paroxysmal atrial fibrillation: Secondary | ICD-10-CM | POA: Diagnosis present

## 2020-10-03 DIAGNOSIS — I252 Old myocardial infarction: Secondary | ICD-10-CM | POA: Diagnosis not present

## 2020-10-03 DIAGNOSIS — I10 Essential (primary) hypertension: Secondary | ICD-10-CM | POA: Diagnosis present

## 2020-10-03 DIAGNOSIS — I700 Atherosclerosis of aorta: Secondary | ICD-10-CM | POA: Diagnosis present

## 2020-10-03 DIAGNOSIS — E8779 Other fluid overload: Secondary | ICD-10-CM | POA: Diagnosis present

## 2020-10-03 DIAGNOSIS — Z789 Other specified health status: Secondary | ICD-10-CM

## 2020-10-03 DIAGNOSIS — Z992 Dependence on renal dialysis: Secondary | ICD-10-CM | POA: Diagnosis not present

## 2020-10-03 DIAGNOSIS — T82868A Thrombosis of vascular prosthetic devices, implants and grafts, initial encounter: Principal | ICD-10-CM | POA: Diagnosis present

## 2020-10-03 DIAGNOSIS — D509 Iron deficiency anemia, unspecified: Secondary | ICD-10-CM | POA: Diagnosis present

## 2020-10-03 DIAGNOSIS — N2581 Secondary hyperparathyroidism of renal origin: Secondary | ICD-10-CM | POA: Diagnosis present

## 2020-10-03 DIAGNOSIS — Z886 Allergy status to analgesic agent status: Secondary | ICD-10-CM

## 2020-10-03 DIAGNOSIS — J810 Acute pulmonary edema: Secondary | ICD-10-CM

## 2020-10-03 DIAGNOSIS — Z8673 Personal history of transient ischemic attack (TIA), and cerebral infarction without residual deficits: Secondary | ICD-10-CM

## 2020-10-03 DIAGNOSIS — I2510 Atherosclerotic heart disease of native coronary artery without angina pectoris: Secondary | ICD-10-CM | POA: Diagnosis present

## 2020-10-03 DIAGNOSIS — Z20822 Contact with and (suspected) exposure to covid-19: Secondary | ICD-10-CM | POA: Diagnosis present

## 2020-10-03 DIAGNOSIS — E8770 Fluid overload, unspecified: Secondary | ICD-10-CM | POA: Diagnosis not present

## 2020-10-03 DIAGNOSIS — I5032 Chronic diastolic (congestive) heart failure: Secondary | ICD-10-CM | POA: Diagnosis present

## 2020-10-03 DIAGNOSIS — Z8543 Personal history of malignant neoplasm of ovary: Secondary | ICD-10-CM

## 2020-10-03 DIAGNOSIS — K219 Gastro-esophageal reflux disease without esophagitis: Secondary | ICD-10-CM | POA: Diagnosis present

## 2020-10-03 DIAGNOSIS — I6529 Occlusion and stenosis of unspecified carotid artery: Secondary | ICD-10-CM | POA: Diagnosis present

## 2020-10-03 DIAGNOSIS — T82590A Other mechanical complication of surgically created arteriovenous fistula, initial encounter: Secondary | ICD-10-CM | POA: Diagnosis not present

## 2020-10-03 DIAGNOSIS — Z79899 Other long term (current) drug therapy: Secondary | ICD-10-CM

## 2020-10-03 DIAGNOSIS — Y712 Prosthetic and other implants, materials and accessory cardiovascular devices associated with adverse incidents: Secondary | ICD-10-CM | POA: Diagnosis present

## 2020-10-03 DIAGNOSIS — I82C11 Acute embolism and thrombosis of right internal jugular vein: Secondary | ICD-10-CM

## 2020-10-03 DIAGNOSIS — Z8719 Personal history of other diseases of the digestive system: Secondary | ICD-10-CM

## 2020-10-03 DIAGNOSIS — Z9115 Patient's noncompliance with renal dialysis: Secondary | ICD-10-CM | POA: Diagnosis not present

## 2020-10-03 DIAGNOSIS — I5022 Chronic systolic (congestive) heart failure: Secondary | ICD-10-CM | POA: Diagnosis present

## 2020-10-03 DIAGNOSIS — Z833 Family history of diabetes mellitus: Secondary | ICD-10-CM

## 2020-10-03 DIAGNOSIS — E8889 Other specified metabolic disorders: Secondary | ICD-10-CM | POA: Diagnosis present

## 2020-10-03 DIAGNOSIS — I132 Hypertensive heart and chronic kidney disease with heart failure and with stage 5 chronic kidney disease, or end stage renal disease: Secondary | ICD-10-CM | POA: Diagnosis present

## 2020-10-03 DIAGNOSIS — E785 Hyperlipidemia, unspecified: Secondary | ICD-10-CM | POA: Diagnosis present

## 2020-10-03 DIAGNOSIS — E7800 Pure hypercholesterolemia, unspecified: Secondary | ICD-10-CM | POA: Diagnosis present

## 2020-10-03 DIAGNOSIS — Z8616 Personal history of COVID-19: Secondary | ICD-10-CM

## 2020-10-03 DIAGNOSIS — I5042 Chronic combined systolic (congestive) and diastolic (congestive) heart failure: Secondary | ICD-10-CM | POA: Diagnosis present

## 2020-10-03 DIAGNOSIS — Z882 Allergy status to sulfonamides status: Secondary | ICD-10-CM

## 2020-10-03 DIAGNOSIS — Z800 Family history of malignant neoplasm of digestive organs: Secondary | ICD-10-CM

## 2020-10-03 DIAGNOSIS — Z8349 Family history of other endocrine, nutritional and metabolic diseases: Secondary | ICD-10-CM

## 2020-10-03 DIAGNOSIS — Z8711 Personal history of peptic ulcer disease: Secondary | ICD-10-CM

## 2020-10-03 DIAGNOSIS — Z880 Allergy status to penicillin: Secondary | ICD-10-CM | POA: Diagnosis not present

## 2020-10-03 DIAGNOSIS — Z888 Allergy status to other drugs, medicaments and biological substances status: Secondary | ICD-10-CM

## 2020-10-03 DIAGNOSIS — Z85038 Personal history of other malignant neoplasm of large intestine: Secondary | ICD-10-CM

## 2020-10-03 DIAGNOSIS — Z8701 Personal history of pneumonia (recurrent): Secondary | ICD-10-CM

## 2020-10-03 DIAGNOSIS — Z7982 Long term (current) use of aspirin: Secondary | ICD-10-CM

## 2020-10-03 DIAGNOSIS — Z91041 Radiographic dye allergy status: Secondary | ICD-10-CM

## 2020-10-03 DIAGNOSIS — Z881 Allergy status to other antibiotic agents status: Secondary | ICD-10-CM

## 2020-10-03 LAB — CBC WITH DIFFERENTIAL/PLATELET
Abs Immature Granulocytes: 0.02 10*3/uL (ref 0.00–0.07)
Basophils Absolute: 0.1 10*3/uL (ref 0.0–0.1)
Basophils Relative: 1 %
Eosinophils Absolute: 0.8 10*3/uL — ABNORMAL HIGH (ref 0.0–0.5)
Eosinophils Relative: 10 %
HCT: 34.8 % — ABNORMAL LOW (ref 36.0–46.0)
Hemoglobin: 10.2 g/dL — ABNORMAL LOW (ref 12.0–15.0)
Immature Granulocytes: 0 %
Lymphocytes Relative: 10 %
Lymphs Abs: 0.7 10*3/uL (ref 0.7–4.0)
MCH: 36.2 pg — ABNORMAL HIGH (ref 26.0–34.0)
MCHC: 29.3 g/dL — ABNORMAL LOW (ref 30.0–36.0)
MCV: 123.4 fL — ABNORMAL HIGH (ref 80.0–100.0)
Monocytes Absolute: 0.9 10*3/uL (ref 0.1–1.0)
Monocytes Relative: 13 %
Neutro Abs: 4.8 10*3/uL (ref 1.7–7.7)
Neutrophils Relative %: 66 %
Platelets: 254 10*3/uL (ref 150–400)
RBC: 2.82 MIL/uL — ABNORMAL LOW (ref 3.87–5.11)
RDW: 19.1 % — ABNORMAL HIGH (ref 11.5–15.5)
WBC: 7.4 10*3/uL (ref 4.0–10.5)
nRBC: 0 % (ref 0.0–0.2)

## 2020-10-03 LAB — BASIC METABOLIC PANEL
Anion gap: 16 — ABNORMAL HIGH (ref 5–15)
BUN: 56 mg/dL — ABNORMAL HIGH (ref 8–23)
CO2: 27 mmol/L (ref 22–32)
Calcium: 10.6 mg/dL — ABNORMAL HIGH (ref 8.9–10.3)
Chloride: 101 mmol/L (ref 98–111)
Creatinine, Ser: 10.03 mg/dL — ABNORMAL HIGH (ref 0.44–1.00)
GFR, Estimated: 4 mL/min — ABNORMAL LOW (ref >60)
Glucose, Bld: 96 mg/dL (ref 70–99)
Potassium: 4.5 mmol/L (ref 3.5–5.1)
Sodium: 144 mmol/L (ref 135–145)

## 2020-10-03 LAB — RESP PANEL BY RT-PCR (FLU A&B, COVID) ARPGX2
Influenza A by PCR: NEGATIVE
Influenza B by PCR: NEGATIVE
SARS Coronavirus 2 by RT PCR: NEGATIVE

## 2020-10-03 IMAGING — DX DG CHEST 1V PORT
1 series · 1 of 1 positions shown · non-contrast
Comparison: Portable exam [HL] hours compared to [DATE]

CLINICAL DATA: Shortness of breath for several hours, has not had
dialysis treatment. History CHF, hypertension, chronic renal
insufficiency, coronary artery disease post MI

EXAM:
PORTABLE CHEST 1 VIEW

[chest ap]
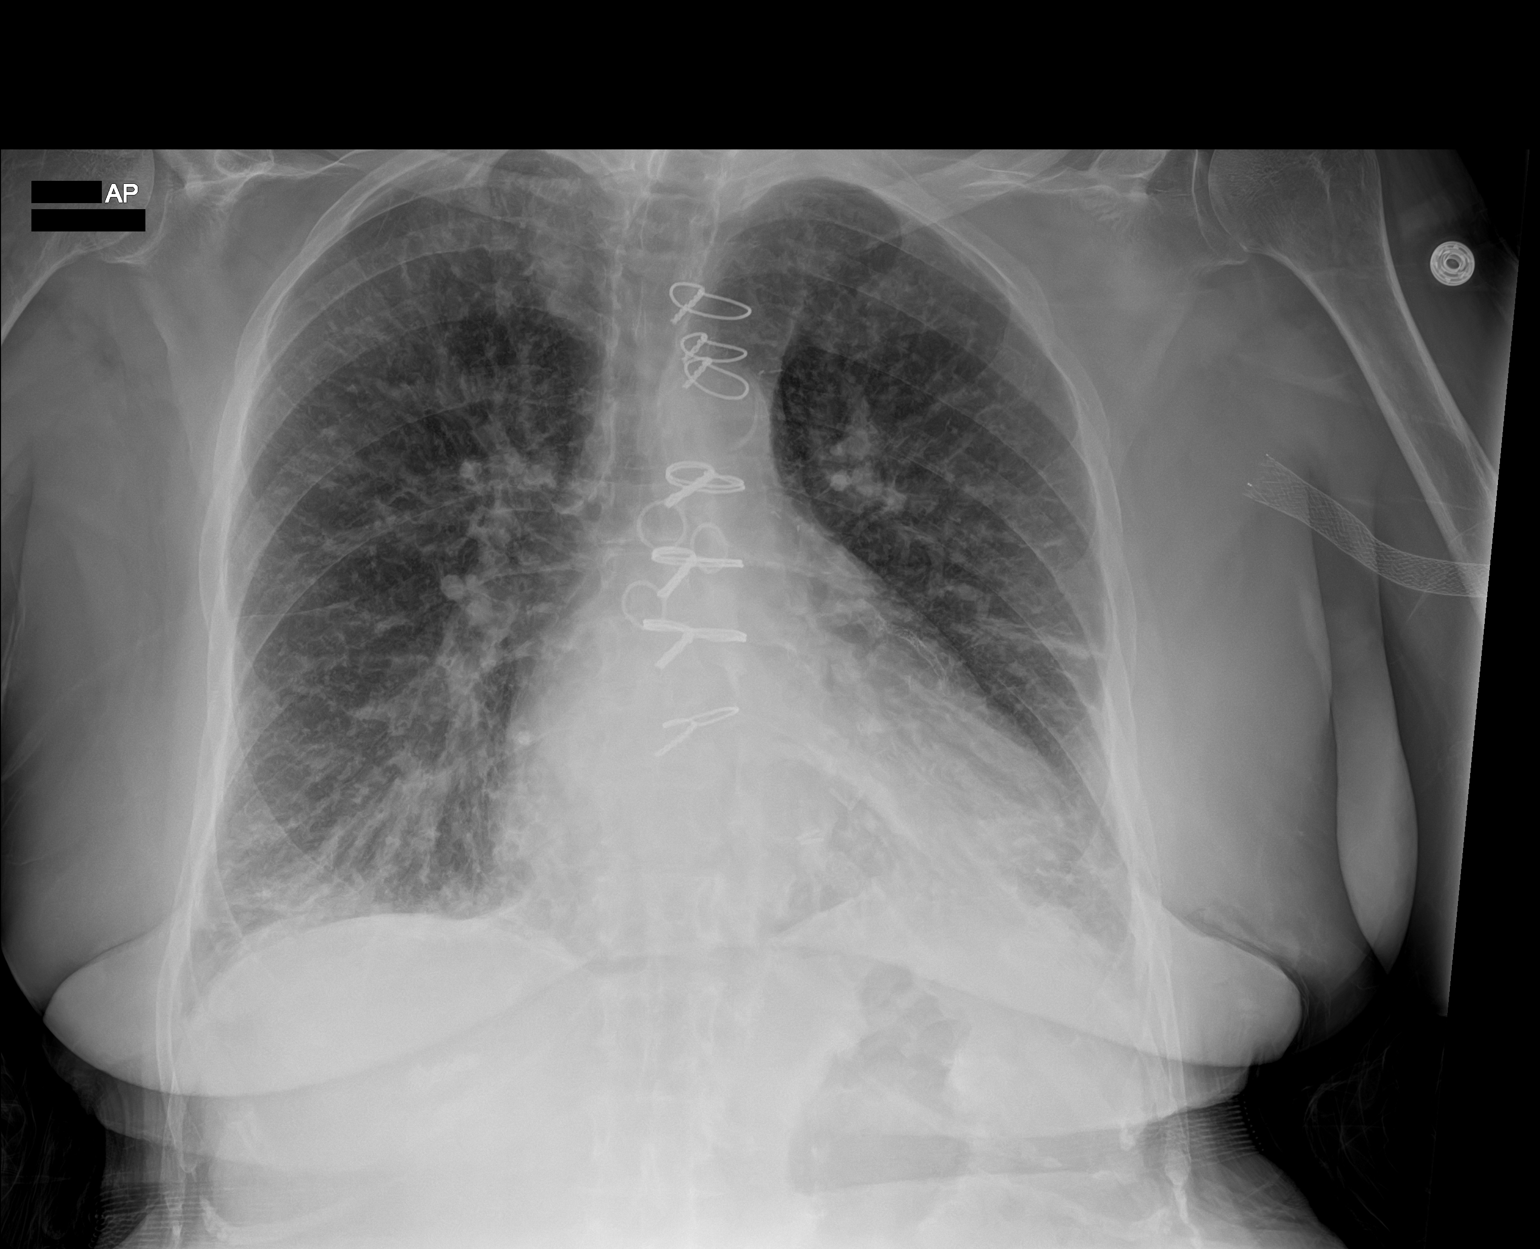

[1 of 1 positions shown; findings below may reference images not displayed]

FINDINGS: Enlargement of cardiac silhouette post CABG and coronary stenting.

Mild pulmonary vascular congestion.

Mediastinal contours normal with atherosclerotic calcification aorta
noted.

Minimal interstitial prominence bilaterally, likely reflecting
minimal failure.

Subsegmental atelectasis bibasilar greater on LEFT.

No pleural effusion or pneumothorax.

Bones demineralized.
IMPRESSION: Enlargement of cardiac silhouette with pulmonary vascular congestion
and question mild pulmonary edema/failure.

Post CABG and coronary stenting.

Scattered atelectasis greater at LEFT base.

Aortic Atherosclerosis ([HL]-[HL]).

## 2020-10-03 MED ORDER — ONDANSETRON HCL 4 MG/2ML IJ SOLN: 4.0000 mg | Freq: Once | INTRAMUSCULAR | Status: AC

## 2020-10-03 MED ORDER — ASPIRIN EC 81 MG PO TBEC
81.0000 mg | DELAYED_RELEASE_TABLET | Freq: Every day | ORAL | Status: DC
  Administered 2020-10-04 – 2020-10-06 (×2): 81 mg via ORAL
  Filled 2020-10-03 (×3): qty 1

## 2020-10-03 MED ORDER — ONDANSETRON HCL 4 MG PO TABS: 4.0000 mg | ORAL_TABLET | Freq: Four times a day (QID) | ORAL | Status: DC | PRN

## 2020-10-03 MED ORDER — SEVELAMER CARBONATE 800 MG PO TABS
1600.0000 mg | ORAL_TABLET | Freq: Three times a day (TID) | ORAL | Status: DC
  Administered 2020-10-04 – 2020-10-07 (×7): 1600 mg via ORAL
  Filled 2020-10-03 (×7): qty 2

## 2020-10-03 MED ORDER — ISOSORBIDE MONONITRATE ER 60 MG PO TB24
90.0000 mg | ORAL_TABLET | Freq: Two times a day (BID) | ORAL | Status: DC
  Administered 2020-10-03 – 2020-10-06 (×5): 90 mg via ORAL
  Filled 2020-10-03 (×2): qty 1
  Filled 2020-10-03 (×3): qty 2

## 2020-10-03 MED ORDER — FLUTICASONE PROPIONATE 50 MCG/ACT NA SUSP: 1.0000 | Freq: Every evening | NASAL | Status: DC | PRN

## 2020-10-03 MED ORDER — LORATADINE 10 MG PO TABS: 10.0000 mg | ORAL_TABLET | Freq: Every day | ORAL | Status: DC | PRN

## 2020-10-03 MED ORDER — ORAL CARE MOUTH RINSE
15.0000 mL | Freq: Two times a day (BID) | OROMUCOSAL | Status: DC
  Administered 2020-10-03 – 2020-10-07 (×7): 15 mL via OROMUCOSAL

## 2020-10-03 MED ORDER — ONDANSETRON HCL 4 MG/2ML IJ SOLN
INTRAMUSCULAR | Status: AC
  Administered 2020-10-03: 4 mg via INTRAVENOUS
  Filled 2020-10-03: qty 2

## 2020-10-03 MED ORDER — SIMVASTATIN 20 MG PO TABS
20.0000 mg | ORAL_TABLET | Freq: Every day | ORAL | Status: DC
  Administered 2020-10-03 – 2020-10-06 (×4): 20 mg via ORAL
  Filled 2020-10-03 (×4): qty 1

## 2020-10-03 MED ORDER — ONDANSETRON HCL 4 MG/2ML IJ SOLN: 4.0000 mg | Freq: Four times a day (QID) | INTRAMUSCULAR | Status: DC | PRN

## 2020-10-03 MED ORDER — AMIODARONE HCL 200 MG PO TABS
200.0000 mg | ORAL_TABLET | Freq: Every day | ORAL | Status: DC
  Administered 2020-10-04 – 2020-10-06 (×2): 200 mg via ORAL
  Filled 2020-10-03 (×3): qty 1

## 2020-10-03 MED ORDER — CHLORHEXIDINE GLUCONATE CLOTH 2 % EX PADS
6.0000 | MEDICATED_PAD | Freq: Every day | CUTANEOUS | Status: DC
  Administered 2020-10-04 – 2020-10-07 (×4): 6 via TOPICAL

## 2020-10-03 NOTE — H&P (Signed)
History and Physical  Shawna [name] IEP:[id number] DOB: 11-26-1940 DOA: 10/03/2020  Referring physician: Rayna Sexton, PA-C, EDP PCP: [facility]  Outpatient Specialists:   Patient Coming From: home  Chief Complaint: problem with fistula  HPI: Shawna [name] is a 80 y.o. female with a history of end-stage renal disease on hemodialysis Mondays, Wednesday, Friday, history of NSTEMI, coronary artery disease, paroxysmal atrial fibrillation rhythm controlled on amiodarone and not on anticoagulation, history of TIA.  Patient had recent Covid infection, now cleared and had a shift in her dialysis schedule.  She received dialysis on Thursday went to dialysis this morning.  At the commencement of dialysis this morning, she developed blood clots in the line and dialysis was discontinued.  She was sent to the emergency department for evaluation.  She has some mild tenderness to the area with no palliating or provoking factors.  She is otherwise asymptomatic.  She is still in need of dialysis.  Emergency Department Course: Surgery consulted.  They recommended temporary femoral artery catheter for dialysis.  Recommended she stay inpatient for a long-term hemodialysis catheter placement on Monday.  Nephrology also consulted for dialysis in the ED  Review of Systems:   Pt denies any fevers, chills, nausea, vomiting, diarrhea, constipation, abdominal pain, shortness of breath, dyspnea on exertion, orthopnea, cough, wheezing, palpitations, headache, vision changes, lightheadedness, dizziness, melena, rectal bleeding.  Review of systems are otherwise negative  Past Medical History:  Diagnosis Date  . Acute on chronic respiratory failure with hypoxia ([place]) 10/10/2016  . Anxiety   . Arthritis   . AVM (arteriovenous malformation) of colon   . CAD (coronary artery disease)    a. s/p CABG in 2013 b. DES to D1 in 10/2016. c. cath in 07/2018 showing patent grafts with occlusion of D1 at prior stent  site and progression of PDA disease --> medical management recommended  . Carotid artery disease ([place])    a. 66-06% LICA, 09/158   . Chronic anemia   . Chronic bronchitis ([place])   . Chronic diastolic CHF (congestive heart failure) ([place])    a. 02/2012 Echo EF 60-65%, nl wall motion, Gr 1 DD, mod MR  . Colon cancer ([place]) 1992  . Esophageal stricture   . ESRD on hemodialysis ([place])    ESRD due to HTN, started dialysis 2011 and gets HD at [facility] with Dr Hinda Lenis on MWF schedule.  Access is LUA AVF as of Sept 2014.   Marland Kitchen GERD (gastroesophageal reflux disease)   . High cholesterol 12/2011  . History of blood transfusion 07/2011; 12/2011; 01/2012 X 2; 04/2012  . History of gout   . History of lower GI bleeding   . Hypertension   . Iron deficiency anemia   . Mitral regurgitation    a. Moderate by echo, 02/2012  . Myocardial infarction ([place])   . NSVT (nonsustained ventricular tachycardia) ([place])   . Ovarian cancer ([place]) 1992  . PAF (paroxysmal atrial fibrillation) ([place])   . Pneumonia ~ 2009  . PUD (peptic ulcer disease)   . TIA (transient ischemic attack)    Past Surgical History:  Procedure Laterality Date  . A/V SHUNTOGRAM Left 03/19/2019   Procedure: A/V SHUNTOGRAM;  Surgeon: Katha Cabal, MD;  Location: [place] CV LAB;  Service: Cardiovascular;  Laterality: Left;  . ABDOMINAL HYSTERECTOMY  1992  . APPENDECTOMY  06/1990  . AV FISTULA PLACEMENT  07/2009   left upper arm  . AV FISTULA PLACEMENT Right 09/06/2016   Procedure: RIGHT  FOREARM ARTERIOVENOUS (AV) GRAFT;  Surgeon: Elam Dutch, MD;  Location[facility] OR;  Service: Vascular;  Laterality: Right;  . AV FISTULA PLACEMENT N/A 02/24/2017   Procedure: INSERTION OF ARTERIOVENOUS (AV) GORE-TEX GRAFT ARM (BRACHIAL AXILLARY);  Surgeon: Katha Cabal, MD;  Location: [facility] ORS;  Service: Vascular;  Laterality: N/A;  . [place] Right 09/06/2016   Procedure: REMOVAL OF Right Arm ARTERIOVENOUS GORETEX GRAFT and Vein Patch angioplasty  of brachial artery;  Surgeon: Angelia Mould, MD;  Location: [place];  Service: Vascular;  Laterality: Right;  . BIOPSY  09/26/2019   Procedure: BIOPSY;  Surgeon: Rogene Houston, MD;  [facility];  Service: Endoscopy;;  . COLON RESECTION  1992  . COLON SURGERY    . COLONOSCOPY N/A 03/09/2019   Procedure: COLONOSCOPY;  Surgeon: Rogene Houston, MD;  [facility];  Service: Endoscopy;  Laterality: N/A;  . CORONARY ANGIOPLASTY WITH STENT PLACEMENT  12/15/11   "2"  . CORONARY ANGIOPLASTY WITH STENT PLACEMENT  y/2013   "1; makes total of 3" (05/02/2012)  . CORONARY ARTERY BYPASS GRAFT  06/13/2012   Procedure: CORONARY ARTERY BYPASS GRAFTING (CABG);  Surgeon: Grace Isaac, MD;  Location: [place];  Service: Open Heart Surgery;  Laterality: N/A;  cabg x four;  using left internal mammary artery, and left leg greater saphenous vein harvested endoscopically  . CORONARY STENT INTERVENTION N/A 10/13/2016   Procedure: Coronary Stent Intervention;  Surgeon: Troy Sine, MD;  Location: [place] CV LAB;  Service: Cardiovascular;  Laterality: N/A;  . DIALYSIS/PERMA CATHETER REMOVAL N/A 04/18/2017   Procedure: DIALYSIS/PERMA CATHETER REMOVAL;  Surgeon: Katha Cabal, MD;  Location: [place] CV LAB;  Service: Cardiovascular;  Laterality: N/A;  . DILATION AND CURETTAGE OF UTERUS    . ESOPHAGOGASTRODUODENOSCOPY  01/20/2012   Procedure: ESOPHAGOGASTRODUODENOSCOPY (EGD);  Surgeon: Ladene Artist, MD,FACG;  Location: [facility] ENDOSCOPY;  Service: Endoscopy;  Laterality: N/A;  . ESOPHAGOGASTRODUODENOSCOPY N/A 03/26/2013   Procedure: ESOPHAGOGASTRODUODENOSCOPY (EGD);  Surgeon: Irene Shipper, MD;  Location[facility];  Service: Endoscopy;  Laterality: N/A;  . ESOPHAGOGASTRODUODENOSCOPY N/A 04/30/2015   Procedure: ESOPHAGOGASTRODUODENOSCOPY (EGD);  Surgeon: Rogene Houston, MD;  [facility];  Service: Endoscopy;  Laterality: N/A;  1pm - moved to 10/20 @ 1:10  .  ESOPHAGOGASTRODUODENOSCOPY N/A 07/29/2016   Procedure: ESOPHAGOGASTRODUODENOSCOPY (EGD);  Surgeon: Manus Gunning, MD;  Location: [place];  Service: Gastroenterology;  Laterality: N/A;  enteroscopy  . ESOPHAGOGASTRODUODENOSCOPY N/A 09/26/2019   Procedure: ESOPHAGOGASTRODUODENOSCOPY (EGD);  Surgeon: Rogene Houston, MD;  [facility];  Service: Endoscopy;  Laterality: N/A;  1250  . GIVENS CAPSULE STUDY N/A 03/07/2019   Procedure: GIVENS CAPSULE STUDY;  Surgeon: Rogene Houston, MD;  [facility];  Service: Endoscopy;  Laterality: N/A;  7:30  . INTRAOPERATIVE TRANSESOPHAGEAL ECHOCARDIOGRAM  06/13/2012   Procedure: INTRAOPERATIVE TRANSESOPHAGEAL ECHOCARDIOGRAM;  Surgeon: Grace Isaac, MD;  Location: [place];  Service: Open Heart Surgery;  Laterality: N/A;  . IR FLUORO GUIDE CV LINE RIGHT  06/17/2020  . IR GENERIC HISTORICAL  07/26/2016   IR FLUORO GUIDE CV LINE RIGHT 07/26/2016 Greggory Keen, MD [facility]INTER[facility]  . IR GENERIC HISTORICAL  07/26/2016   IR US GUIDE VASC ACCESS RIGHT 07/26/2016 Greggory Keen, MD [facility]  . IR GENERIC HISTORICAL  08/02/2016   IR US GUIDE VASC ACCESS RIGHT 08/02/2016 Greggory Keen, MD [facility]  . IR GENERIC HISTORICAL  08/02/2016   IR FLUORO  GUIDE CV LINE RIGHT 08/02/2016 Greggory Keen, MD [facility]  . IR RADIOLOGY PERIPHERAL GUIDED IV START  03/28/2017  . IR REMOVAL TUN CV CATH W/O FL  08/11/2020  . IR THROMBECTOMY AV FISTULA W/THROMBOLYSIS INC/SHUNT/IMG LEFT Left 06/17/2020  . IR US GUIDE VASC ACCESS LEFT  06/17/2020  . IR US GUIDE VASC ACCESS RIGHT  03/28/2017  . IR US GUIDE VASC ACCESS RIGHT  06/17/2020  . LEFT HEART CATH AND CORONARY ANGIOGRAPHY N/A 09/20/2016   Procedure: Left Heart Cath and Coronary Angiography;  Surgeon: [name], MD;  Location: [place] [facility] LAB;  Service: Cardiovascular;  Laterality: N/A;  . LEFT HEART CATH AND CORS/GRAFTS ANGIOGRAPHY N/A 10/13/2016   Procedure: Left Heart Cath and Cors/Grafts  Angiography;  Surgeon: Troy Sine, MD;  Location: [place] CV LAB;  Service: Cardiovascular;  Laterality: N/A;  . LEFT HEART CATH AND CORS/GRAFTS ANGIOGRAPHY N/A 07/13/2018   Procedure: LEFT HEART CATH AND CORS/GRAFTS ANGIOGRAPHY;  Surgeon: Martinique, Peter M, MD;  Location: [place] [facility] LAB;  Service: Cardiovascular;  Laterality: N/A;  . LEFT HEART CATHETERIZATION WITH CORONARY ANGIOGRAM N/A 12/15/2011   Procedure: LEFT HEART CATHETERIZATION WITH CORONARY ANGIOGRAM;  Surgeon: Burnell Blanks, MD;  Location: [facility] CATH LAB;  Service: Cardiovascular;  Laterality: N/A;  . LEFT HEART CATHETERIZATION WITH CORONARY ANGIOGRAM N/A 01/10/2012   Procedure: LEFT HEART CATHETERIZATION WITH CORONARY ANGIOGRAM;  Surgeon: Peter M Martinique, MD;  Location: [facility] CATH LAB;  Service: Cardiovascular;  Laterality: N/A;  . LEFT HEART CATHETERIZATION WITH CORONARY ANGIOGRAM N/A 06/08/2012   Procedure: LEFT HEART CATHETERIZATION WITH CORONARY ANGIOGRAM;  Surgeon: Burnell Blanks, MD;  Location: [facility] CATH LAB;  Service: Cardiovascular;  Laterality: N/A;  . LEFT HEART CATHETERIZATION WITH CORONARY/GRAFT ANGIOGRAM N/A 12/10/2013   Procedure: LEFT HEART CATHETERIZATION WITH Beatrix Fetters;  Surgeon: [name], MD;  Location: [facility] CATH LAB;  Service: Cardiovascular;  Laterality: N/A;  . OVARY SURGERY     ovarian cancer  . POLYPECTOMY  03/09/2019   Procedure: POLYPECTOMY;  Surgeon: Rogene Houston, MD;  [facility];  Service: Endoscopy;;  cecal   . POLYPECTOMY N/A 09/26/2019   Procedure: DUODENAL POLYPECTOMY;  Surgeon: Rogene Houston, MD;  [facility];  Service: Endoscopy;  Laterality: N/A;  . REVISION OF ARTERIOVENOUS GORETEX GRAFT N/A 02/24/2017   Procedure: REVISION OF ARTERIOVENOUS GORETEX GRAFT (RESECTION);  Surgeon: Katha Cabal, MD;  Location: [facility] ORS;  Service: Vascular;  Laterality: N/A;  . REVISON OF ARTERIOVENOUS FISTULA Left 06/19/2020   Procedure: REVISION OF  LEFT UPPER ARM AV GRAFT WITH INTERPOSITION JUMP GRAFT USING 6MM GORE LIMB;  Surgeon: Marty Heck, MD;  Location: [place];  Service: Vascular;  Laterality: Left;  . SHUNTOGRAM N/A 10/15/2013   Procedure: Fistulogram;  Surgeon: Serafina Mitchell, MD;  Location: [facility] CATH LAB;  Service: Cardiovascular;  Laterality: N/A;  . THROMBECTOMY / ARTERIOVENOUS GRAFT REVISION  2011   left upper arm  . TUBAL LIGATION  1980's  . UPPER EXTREMITY ANGIOGRAPHY Bilateral 12/06/2016   Procedure: Upper Extremity Angiography;  Surgeon: Katha Cabal, MD;  Location: [place] [facility] LAB;  Service: Cardiovascular;  Laterality: Bilateral;  . UPPER EXTREMITY INTERVENTION Left 06/06/2017   Procedure: UPPER EXTREMITY INTERVENTION;  Surgeon: Katha Cabal, MD;  Location: [place] CV LAB;  Service: Cardiovascular;  Laterality: Left;   Social History:  reports that she has never smoked. She has never used smokeless tobacco. She reports that she does not drink  alcohol and does not use drugs. Patient lives at home  Allergies  Allergen Reactions  . Aspirin Other (See Comments)    High Doses Mess up her stomach; "makes my bowels have blood in them". Takes 81 mg EC Aspirin   . Penicillins Other (See Comments)    SYNCOPE? , "makes me real weak when I take it; like I'll pass out"  Has patient had a PCN reaction causing immediate rash, facial/tongue/throat swelling, SOB or lightheadedness with hypotension: Yes Has patient had a PCN reaction causing severe rash involving mucus membranes or skin necrosis: no Has patient had a PCN reaction that required hospitalization no Has patient had a PCN reaction occurring within the last 10 years: no If all of the above  . Amlodipine Swelling  . Bactrim [Sulfamethoxazole-Trimethoprim] Rash  . Contrast Media [Iodinated Diagnostic Agents] Itching  . Iron Itching and Other (See Comments)    "they gave me iron in dialysis; had to give me Benadryl cause I had to have the iron"  (05/02/2012)  . Nitrofurantoin Hives  . Tylenol [Acetaminophen] Itching and Other (See Comments)    Makes her feet on fire per pt  . Gabapentin Other (See Comments)    Unknown reaction  . Hydralazine Itching  . Ranexa [Ranolazine] Other (See Comments)    Myoclonus-hospitalized   . Dexilant [Dexlansoprazole] Other (See Comments)    Upset stomach  . Levaquin [Levofloxacin In D5w] Rash  . Morphine And Related Itching and Other (See Comments)    Itching in feet  . Plavix [Clopidogrel Bisulfate] Rash  . Protonix [Pantoprazole Sodium] Rash  . Venofer [Ferric Oxide] Itching and Other (See Comments)    Patient reports using Benadryl prior to doses as [facility]    Family History  Problem Relation Age of Onset  . Heart disease Mother        Heart Disease before age 6  . Hyperlipidemia Mother   . Hypertension Mother   . Diabetes Mother   . Heart attack Mother   . Heart disease Father        Heart Disease before age 56  . Hyperlipidemia Father   . Hypertension Father   . Diabetes Father   . Diabetes Sister   . Hypertension Sister   . Diabetes Brother   . Hyperlipidemia Brother   . Heart attack Brother   . Hypertension Sister   . Heart attack Brother   . Colon cancer Child 28  . Other Other        noncontributory for early CAD  . Esophageal cancer Neg Hx   . Liver disease Neg Hx   . Kidney disease Neg Hx   . Colon polyps Neg Hx       Prior to Admission medications   Medication Sig Start Date End Date Taking? Authorizing Provider  amiodarone (PACERONE) 200 MG tablet Take 1 tablet (200 mg total) by mouth daily. Starting July 2 12/17/19  Yes Strader, Fransisco Hertz, PA-C  aspirin EC 81 MG tablet Take 81 mg by mouth daily.  09/27/19  Yes Rehman, Mechele [place], MD  fluticasone (FLONASE) 50 MCG/ACT nasal spray Place 1 spray into both nostrils at bedtime as needed for allergies.    Yes [provider]  isosorbide mononitrate (IMDUR) 30 MG 24 hr tablet Take 3 tablets (90 mg total)  by mouth 2 (two) times daily. 09/29/20  Yes Shahmehdi, Valeria Batman, MD  lidocaine-prilocaine (EMLA) cream Apply 1 application topically every Monday, Wednesday, and Friday. Prior to dialysis  Yes [provider]  loratadine (CLARITIN) 10 MG tablet Take 1 tablet (10 mg total) by mouth daily as needed for allergies. 07/14/18  Yes Hosie Poisson, MD  multivitamin (RENA-VIT) TABS tablet Take 1 tablet by mouth daily.   Yes [provider]  nitroGLYCERIN (NITROSTAT) 0.4 MG SL tablet Place 1 tablet (0.4 mg total) under the tongue every 5 (five) minutes x 3 doses as needed for chest pain (if no relief after 2nd dose, proceed to the ED for an evaluation or call 911). 09/08/20  Yes Verta Ellen., NP  omeprazole (PRILOSEC) 40 MG capsule Take 1 capsule (40 mg total) by mouth daily before breakfast. 03/31/20  Yes Laurine Blazer B, PA-C  sevelamer carbonate (RENVELA) 800 MG tablet Take 2 tablets (1,600 mg total) by mouth 3 (three) times daily with meals. 06/20/20  Yes Meccariello, Bernita Raisin, DO  simvastatin (ZOCOR) 20 MG tablet Take 1 tablet (20 mg total) by mouth at bedtime. 07/07/20  Yes Verta Ellen., NP    Physical Exam: BP (!) 211/69   Pulse 88   Temp 98.2 F (36.8 C) (Oral)   Resp 19   Ht 5\' 1"  (1.549 m)   Wt 62.7 kg   SpO2 100%   BMI 26.12 kg/m   . General: Elderly female. Awake and alert and oriented x3. No acute cardiopulmonary distress.  Marland Kitchen HEENT: Normocephalic atraumatic.  Right and left ears normal in appearance.  Pupils equal, round, reactive to light. Extraocular muscles are intact. Sclerae anicteric and noninjected.  Moist mucosal membranes. No mucosal lesions.  . Neck: Neck supple without lymphadenopathy. No carotid bruits. No masses palpated.  . Cardiovascular: Regular rate with normal S1-S2 sounds. No murmurs, rubs, gallops auscultated. No JVD.  Marland Kitchen Respiratory: Good respiratory effort with no wheezes, rales, rhonchi. Lungs clear to auscultation bilaterally.  No  accessory muscle use. . Abdomen: Soft, nontender, nondistended. Active bowel sounds. No masses or hepatosplenomegaly  . Skin: No rashes, lesions, or ulcerations.  Dry, warm to touch. 2+ dorsalis pedis and radial pulses. . Musculoskeletal: No calf or leg pain. All major joints not erythematous nontender.  No upper or lower joint deformation.  Good ROM.  No contractures  . Psychiatric: Intact judgment and insight. Pleasant and cooperative. . Neurologic: No focal neurological deficits. Strength is 5/5 and symmetric in upper and lower extremities.  Cranial nerves II through XII are grossly intact.           Labs on Admission: I have personally reviewed following labs and imaging studies  CBC: Recent Labs  Lab 09/29/20 0112 10/03/20 1225  WBC 5.6 7.4  NEUTROABS 3.6 4.8  HGB 9.8* 10.2*  HCT 33.0* 34.8*  MCV 123.1* 123.4*  PLT 210 841   Basic Metabolic Panel: Recent Labs  Lab 09/29/20 0112 10/03/20 1225  NA 139 144  K 4.3 4.5  CL 101 101  CO2 26 27  GLUCOSE 99 96  BUN 27* 56*  CREATININE 5.44* 10.03*  CALCIUM 9.7 10.6*   GFR: Estimated Creatinine Clearance: 3.8 mL/min (A) (by C-G formula based on SCr of 10.03 mg/dL (H)). Liver Function Tests: No results for input(s): AST, ALT, ALKPHOS, BILITOT, PROT, ALBUMIN in the last 168 hours. No results for input(s): LIPASE, AMYLASE in the last 168 hours. No results for input(s): AMMONIA in the last 168 hours. Coagulation Profile: No results for input(s): INR, PROTIME in the last 168 hours. Cardiac Enzymes: No results for input(s): CKTOTAL, CKMB, CKMBINDEX, TROPONINI in the last 168 hours.  BNP (last 3 results) No results for input(s): PROBNP in the last 8760 hours. HbA1C: No results for input(s): HGBA1C in the last 72 hours. CBG: No results for input(s): GLUCAP in the last 168 hours. Lipid Profile: No results for input(s): CHOL, HDL, LDLCALC, TRIG, CHOLHDL, LDLDIRECT in the last 72 hours. Thyroid Function Tests: No results for  input(s): TSH, T4TOTAL, FREET4, T3FREE, THYROIDAB in the last 72 hours. Anemia Panel: No results for input(s): VITAMINB12, FOLATE, FERRITIN, TIBC, IRON, RETICCTPCT in the last 72 hours. Urine analysis:    Component Value Date/Time   COLORURINE YELLOW 12/16/2012 1919   APPEARANCEUR CLOUDY (A) 12/16/2012 1919   LABSPEC 1.009 12/16/2012 1919   PHURINE 7.5 12/16/2012 1919   GLUCOSEU NEGATIVE 12/16/2012 1919   HGBUR TRACE (A) 12/16/2012 1919   BILIRUBINUR NEGATIVE 12/16/2012 1919   KETONESUR NEGATIVE 12/16/2012 1919   PROTEINUR 100 (A) 12/16/2012 1919   UROBILINOGEN 0.2 12/16/2012 1919   NITRITE NEGATIVE 12/16/2012 1919   LEUKOCYTESUR SMALL (A) 12/16/2012 1919   Sepsis Labs: @LABRCNTIP (procalcitonin:4,lacticidven:4) ) Recent Results (from the past 240 hour(s))  Resp Panel by RT-PCR (Flu A&B, Covid) Nasopharyngeal Swab     Status: Abnormal   Collection Time: 09/29/20  3:21 AM   Specimen: Nasopharyngeal Swab; Nasopharyngeal(NP) swabs in vial transport medium  Result Value Ref Range Status   SARS Coronavirus 2 by RT PCR POSITIVE (A) NEGATIVE Final    Comment: RESULT CALLED TO, READ BACK BY AND VERIFIED WITH: K NICHOLS,RN@[id number]  09/29/20 MKELLY (NOTE) SARS-CoV-2 target nucleic acids are DETECTED.  The SARS-CoV-2 RNA is generally detectable in upper respiratory specimens during the acute phase of infection. Positive results are indicative of the presence of the identified virus, but do not rule out bacterial infection or co-infection with other pathogens not detected by the test. Clinical correlation with patient history and other diagnostic information is necessary to determine patient infection status. The expected result is Negative.  Fact Sheet for Patients: EntrepreneurPulse.com.au  Fact Sheet for Healthcare Providers: IncredibleEmployment.be  This test is not yet approved or cleared by the [place] FDA and  has been authorized for  detection and/or diagnosis of SARS-CoV-2 by FDA under an Emergency Use Authorization (EUA).  This EUA will remain in effect (meaning this test can be  used) for the duration of  the COVID-19 declaration under Section 564(b)(1) of the Act, 21 U.S.C. section 360bbb-3(b)(1), unless the authorization is terminated or revoked sooner.     Influenza A by PCR NEGATIVE NEGATIVE Final   Influenza B by PCR NEGATIVE NEGATIVE Final    Comment: (NOTE) The Xpert Xpress SARS-CoV-2/FLU/RSV plus assay is intended as an aid in the diagnosis of influenza from Nasopharyngeal swab specimens and should not be used as a sole basis for treatment. Nasal washings and aspirates are unacceptable for Xpert Xpress SARS-CoV-2/FLU/RSV testing.  Fact Sheet for Patients: EntrepreneurPulse.com.au  Fact Sheet for Healthcare Providers: IncredibleEmployment.be  This test is not yet approved or cleared by the [place] FDA and has been authorized for detection and/or diagnosis of SARS-CoV-2 by FDA under an Emergency Use Authorization (EUA). This EUA will remain in effect (meaning this test can be used) for the duration of the COVID-19 declaration under Section 564(b)(1) of the Act, 21 U.S.C. section 360bbb-3(b)(1), unless the authorization is terminated or revoked.  Performed at [facility], [address] [place], [place] [address]      Radiological Exams on Admission: DG Chest Portable 1 View  Result Date: 10/03/2020 CLINICAL DATA:  Shortness of breath for  several hours, has not had dialysis treatment. History CHF, hypertension, chronic renal insufficiency, coronary artery disease post MI EXAM: PORTABLE CHEST 1 VIEW COMPARISON:  Portable exam 1455 hours compared to 09/29/2020 FINDINGS: Enlargement of cardiac silhouette post CABG and coronary stenting. Mild pulmonary vascular congestion. Mediastinal contours normal with atherosclerotic calcification aorta noted. Minimal  interstitial prominence bilaterally, likely reflecting minimal failure. Subsegmental atelectasis bibasilar greater on LEFT. No pleural effusion or pneumothorax. Bones demineralized. IMPRESSION: Enlargement of cardiac silhouette with pulmonary vascular congestion and question mild pulmonary edema/failure. Post CABG and coronary stenting. Scattered atelectasis greater at LEFT base. Aortic Atherosclerosis (ICD10-I70.0). Electronically Signed   By: Lavonia Dana M.D.   On: 10/03/2020 15:03    Assessment/Plan: Active Problems:   GERD (gastroesophageal reflux disease)   Chronic diastolic CHF (congestive heart failure) (HCC)   Essential hypertension   ESRD (end stage renal disease) on dialysis (HCC)   Fluid overload   H/O non-ST elevation myocardial infarction (NSTEMI)   Paroxysmal atrial fibrillation (HCC)   History of GI bleed   Failure of surgically constructed arteriovenous fistula ([place])    This patient was discussed with the ED physician, including pertinent vitals, physical exam findings, labs, and imaging.  We also discussed care given by the ED provider.  1. Failure surgically constructed AV fistula a. Admit b. Surgery to consult for hemodialysis catheter placement on Monday 2. Fluid overloaded a. Dialysis today 3. End-stage renal disease on hemodialysis a. Dialysis per nephrology 4. Paroxysmal atrial fibrillation a. Amiodarone 5. History of NSTEMI 6. Hypertension a. Continue home medications 7. Chronic diastolic heart failure a. Compensated 8. GERD  DVT prophylaxis: SCDs given surgery on Monday Consultants: Nephrology, general surgery Code Status: Full code Family Communication: None Disposition Plan: Home following dialysis catheter placement   Truett Mainland, DO

## 2020-10-03 NOTE — Progress Notes (Signed)
Pt's access has a bruit but the access will not run despite multiple attempts by our HD nurse just now.  Pt will need admission and consult for temporary HD catheter access.  Will discuss w/ ED providers.   Kelly Splinter, MD 10/03/2020, 5:52 PM

## 2020-10-03 NOTE — Discharge Instructions (Addendum)
IMPORTANT INFORMATION: PAY CLOSE ATTENTION   PHYSICIAN DISCHARGE INSTRUCTIONS  Follow with Primary care provider  [facility]  and other consultants as instructed by your Hospitalist Physician  SEEK MEDICAL CARE OR RETURN TO EMERGENCY ROOM IF SYMPTOMS COME BACK, WORSEN OR NEW PROBLEM DEVELOPS   Please note: You were cared for by a hospitalist during your hospital stay. Every effort will be made to forward records to your primary care provider.  You can request that your primary care provider send for your hospital records if they have not received them.  Once you are discharged, your primary care physician will handle any further medical issues. Please note that NO REFILLS for any discharge medications will be authorized once you are discharged, as it is imperative that you return to your primary care physician (or establish a relationship with a primary care physician if you do not have one) for your post hospital discharge needs so that they can reassess your need for medications and monitor your lab values.  Please get a complete blood count and chemistry panel checked by your Primary MD at your next visit, and again as instructed by your Primary MD.  Get Medicines reviewed and adjusted: Please take all your medications with you for your next visit with your Primary MD  Laboratory/radiological data: Please request your Primary MD to go over all hospital tests and procedure/radiological results at the follow up, please ask your primary care provider to get all Hospital records sent to his/her office.  In some cases, they will be blood work, cultures and biopsy results pending at the time of your discharge. Please request that your primary care provider follow up on these results.  If you are diabetic, please bring your blood sugar readings with you to your follow up appointment with primary care.    Please call and make your follow up appointments as soon as possible.     Also Note the following: If you experience worsening of your admission symptoms, develop shortness of breath, life threatening emergency, suicidal or homicidal thoughts you must seek medical attention immediately by calling 911 or calling your MD immediately  if symptoms less severe.  You must read complete instructions/literature along with all the possible adverse reactions/side effects for all the Medicines you take and that have been prescribed to you. Take any new Medicines after you have completely understood and accpet all the possible adverse reactions/side effects.   Do not drive when taking Pain medications or sleeping medications (Benzodiazepines)  Do not take more than prescribed Pain, Sleep and Anxiety Medications. It is not advisable to combine anxiety,sleep and pain medications without talking with your primary care practitioner  Special Instructions: If you have smoked or chewed Tobacco  in the last 2 yrs please stop smoking, stop any regular Alcohol  and or any Recreational drug use.  Wear Seat belts while driving.  Do not drive if taking any narcotic, mind altering or controlled substances or recreational drugs or alcohol.       

## 2020-10-03 NOTE — Progress Notes (Signed)
Patient ID: Shawna [name], female   DOB: 03/03/1941, 80 y.o.   MRN: [id number]  During HD, had vasovagal episode. Will place in stepdown overnight. Can move to floor if remains stable.  Truett Mainland, DO

## 2020-10-03 NOTE — Progress Notes (Signed)
Pt HD acces not working well and unable to run HD tx.Dr. Jonnie Finner notified. Pt is waiting for  a temporary HD  catheter for dialysis.

## 2020-10-03 NOTE — ED Provider Notes (Signed)
Medical screening examination/treatment/procedure(s) were conducted as a shared visit with non-physician practitioner(s) and myself.  I personally evaluated the patient during the encounter.  Clinical Impression:   Final diagnoses:  Dialysis AV fistula malfunction, initial encounter ([place])  Acute pulmonary edema (HCC)    Pt with missing dialysis due to possible malfunctioning fistula - has tachypnea, swelling in the legs -pulmonary edema on exam, speaks in short sentences, mildly tachycardic and a blood pressure of 215/73.  This patient will need to be evaluated by nephrology and have stat dialysis.  This week she is had some stretched out dialysis on Monday and Thursday and then missed today because they had a incidental finding of COVID-19, she then had a negative test for COVID-19 and she never had any symptoms so they are confused as to what was going on with that.  Chest x-ray reviewed and shows some interstitial and early pulmonary edema, cardiomegaly.  The patient will need urgent dialysis due to severe hypertension and pulmonary edema.  Critically ill  I was asked by Dr. Jonnie Finner to place emergency dialysis catheter in the ED to expedite dialysis as she has pulmonary edema  .Central Line  Date/Time: 10/03/2020 7:33 PM Performed by: Noemi Chapel, MD Authorized by: Noemi Chapel, MD   Consent:    Consent obtained:  Verbal   Consent given by:  Patient   Risks, benefits, and alternatives were discussed: yes     Risks discussed:  Arterial puncture, incorrect placement, nerve damage, infection and bleeding   Alternatives discussed:  Delayed treatment Universal protocol:    Procedure explained and questions answered to patient or proxy's satisfaction: yes     Relevant documents present and verified: yes     Test results available: yes     Imaging studies available: yes     Required blood products, implants, devices, and special equipment available: yes     Site/side marked: yes      Immediately prior to procedure, a time out was called: yes     Patient identity confirmed:  Verbally with patient and arm band Pre-procedure details:    Indication(s): central venous access     Indication(s) comment:  Dialysis catheter   Hand hygiene: Hand hygiene performed prior to insertion     Sterile barrier technique: All elements of maximal sterile technique followed     Skin preparation:  Chlorhexidine   Skin preparation agent: Skin preparation agent completely dried prior to procedure   Sedation:    Sedation type:  None Anesthesia:    Anesthesia method:  Local infiltration   Local anesthetic:  Lidocaine 1% w/o epi Procedure details:    Location:  R femoral   Patient position:  Supine   Procedural supplies:  Double lumen   Catheter size: 12 french dialysis catheter - 20cm.   Landmarks identified: yes     Ultrasound guidance: yes     Ultrasound guidance timing: real time     Sterile ultrasound techniques: Sterile gel and sterile probe covers were used     Number of attempts:  1   Successful placement: yes   Post-procedure details:    Post-procedure:  Dressing applied and line sutured   Assessment:  Blood return through all ports and free fluid flow   Procedure completion:  Tolerated Comments:       .Critical Care Performed by: Noemi Chapel, MD Authorized by: Noemi Chapel, MD   Critical care provider statement:    Critical care time (minutes):  35   Critical care  time was exclusive of:  Separately billable procedures and treating other patients and teaching time   Critical care was necessary to treat or prevent imminent or life-threatening deterioration of the following conditions: acute pulmonary edema, emergent dialysis.   Critical care was time spent personally by me on the following activities:  Blood draw for specimens, development of treatment plan with patient or surrogate, discussions with consultants, evaluation of patient's response to treatment,  examination of patient, obtaining history from patient or surrogate, ordering and performing treatments and interventions, ordering and review of laboratory studies, ordering and review of radiographic studies, pulse oximetry, re-evaluation of patient's condition and review of old charts      Noemi Chapel, MD 10/03/20 1936

## 2020-10-03 NOTE — ED Notes (Signed)
Pt husband updated per pt request.

## 2020-10-03 NOTE — ED Provider Notes (Addendum)
[facility] EMERGENCY DEPARTMENT Provider Note   CSN: [id number] Arrival date & time: 10/03/20  1030     History Chief Complaint  Patient presents with  . Blood clots    Shawna [name] is a 80 y.o. female.  HPI Patient is an 80 year old female with a history of ESRD on hemodialysis Monday, Wednesday, Friday, GERD, MI, CAD, TIA.  She presents the emergency department due to issues with her left AV fistula.  Patient was evaluated on March 22 and discharged the same day for unstable angina.  She states that she was incidentally found to be Covid positive on that day.  She states she had a repeat test the following day that was negative.  She has been vaccinated x3 and denies any URI symptoms.  No continued chest pain or shortness of breath.  She states that she typically gets dialysis Monday, Wednesday, as well as Friday.  Due to her COVID-19 status last weekend her dialysis center was not able to dialyze her this Friday.  She instead went to dialysis this morning.  She states that her last normal dialysis session was this Thursday.  She states that after they began dialyzing her she started to develop clots in the line.  Due to this, dialysis was discontinued and she was sent to the emergency department for evaluation.  She reports mild pain in the region but otherwise has no complaints.    Past Medical History:  Diagnosis Date  . Acute on chronic respiratory failure with hypoxia ([place]) 10/10/2016  . Anxiety   . Arthritis   . AVM (arteriovenous malformation) of colon   . CAD (coronary artery disease)    a. s/p CABG in 2013 b. DES to D1 in 10/2016. c. cath in 07/2018 showing patent grafts with occlusion of D1 at prior stent site and progression of PDA disease --> medical management recommended  . Carotid artery disease ([place])    a. 40-98% LICA, 07/1912   . Chronic anemia   . Chronic bronchitis ([place])   . Chronic diastolic CHF (congestive heart failure) ([place])    a. 02/2012 Echo EF 60-65%, nl  wall motion, Gr 1 DD, mod MR  . Colon cancer ([place]) 1992  . Esophageal stricture   . ESRD on hemodialysis ([place])    ESRD due to HTN, started dialysis 2011 and gets HD at [facility] with Dr Hinda Lenis on MWF schedule.  Access is LUA AVF as of Sept 2014.   Marland Kitchen GERD (gastroesophageal reflux disease)   . High cholesterol 12/2011  . History of blood transfusion 07/2011; 12/2011; 01/2012 X 2; 04/2012  . History of gout   . History of lower GI bleeding   . Hypertension   . Iron deficiency anemia   . Mitral regurgitation    a. Moderate by echo, 02/2012  . Myocardial infarction ([place])   . NSVT (nonsustained ventricular tachycardia) ([place])   . Ovarian cancer ([place]) 1992  . PAF (paroxysmal atrial fibrillation) ([place])   . Pneumonia ~ 2009  . PUD (peptic ulcer disease)   . TIA (transient ischemic attack)     Patient Active Problem List   Diagnosis Date Noted  . Myoclonus 08/31/2020  . Clotted renal dialysis AV graft, initial encounter ([place])   . Hypertensive heart and chronic kidney disease with heart failure and stage 1 through stage 4 chronic kidney disease, or chronic kidney disease ([place])   . Hemodialysis-associated hypotension   . Acute hypoxemic respiratory failure ([place]) 06/14/2020  . Hypertension 06/12/2020  .  Irritable bowel syndrome 02/25/2020  . Pulmonary edema 10/14/2019  . Adenomatous duodenal polyp 09/10/2019  . History of GI bleed 09/10/2019  . Angina pectoris ([place]) 06/05/2019  . Chest pain 06/03/2019  . Rectal bleeding 05/14/2019  . Small intestinal bacterial overgrowth 05/14/2019  . Iron deficiency anemia 04/02/2019  . GI bleed 03/08/2019  . Gastrointestinal hemorrhage with melena 03/06/2019  . Acute respiratory failure with hypoxia ([place]) 12/25/2018  . Elevated troponin 12/14/2018  . Chest pain at rest 07/13/2018  . Hand steal syndrome ([place]) 08/01/2017  . Macrocytic anemia 07/14/2017  . Coronary artery disease 06/05/2017  . Mesenteric ischemia ([place])   . Diverticulitis   .  Enteritis   . Complication of vascular access for dialysis 03/19/2017  . Preoperative clearance 01/25/2017  . Symptomatic anemia 10/24/2016  . H/O non-ST elevation myocardial infarction (NSTEMI) 10/24/2016  . Fluid overload 10/10/2016  . Complication from renal dialysis device 10/10/2016  . Non-ST elevation MI (NSTEMI) ([place])   . Encounter for fitting and adjustment of vascular catheter   . Heme positive stool   . Demand ischemia ([place]) 07/27/2016  . Hypertensive emergency 07/08/2016  . Acute on chronic respiratory failure with hypoxia ([place])   . Cardiac arrest ([place])   . Palliative care encounter   . Goals of care, counseling/discussion   . Hypertensive crisis without congestive heart failure 05/09/2016  . Acute pulmonary edema ([place]) 04/06/2016  . Acute respiratory failure ([place]) 04/06/2016  . Hypertensive crisis 01/27/2016  . History of colon cancer 01/27/2016  . History of ovarian cancer 01/27/2016  . Hypertensive urgency 01/27/2016  . Paroxysmal atrial fibrillation ([place]) 10/14/2015  . Coronary angioplasty status 10/14/2015  . Malignant neoplasm of right ovary ([place]) 10/14/2015  . Narrow complex tachycardia ([place]) 09/08/2015  . SVT (supraventricular tachycardia) ([place]) 09/08/2015  . Influenza A 08/30/2015  . Acute on chronic diastolic CHF (congestive heart failure) ([place]) 05/04/2015  . Unstable angina ([place]) 05/03/2015  . DOE (dyspnea on exertion)   . Essential hypertension   . Pain in joint, lower leg 08/14/2014  . Dacryocystitis 05/29/2013  . Chronic diastolic CHF (congestive heart failure) ([place]) 03/22/2013  . GI bleeding 03/21/2013  . Acute blood loss anemia 03/21/2013  . Vaginal odor 03/12/2013  . Vaginal discharge 03/12/2013  . Occlusion and stenosis of carotid artery without mention of cerebral infarction 01/24/2013  . Hx of CABG 07/05/2012  . Carotid artery disease ([place]) 07/05/2012  . Mitral regurgitation 06/12/2012  . Pneumonia 06/09/2012  . Non-STEMI (non-ST elevated  myocardial infarction) ([place]) 06/08/2012  . Ischemic chest pain ([place]) 03/01/2012  . 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 GERD (gastroesophageal reflux disease) 01/09/2012  . HLD (hyperlipidemia) 01/05/2012  . Atherosclerotic heart disease of native coronary artery without angina pectoris 12/16/2011  . Essential hypertension, benign 12/16/2011  . ESRD on hemodialysis ([place]) 12/16/2011  . Anxiety disorder 05/04/2011  . Anemia in chronic kidney disease 04/29/2011  . Secondary hyperparathyroidism of renal origin ([place]) 04/29/2011  . ESRD (end stage renal disease) on dialysis ([place]) 04/29/2011  . Gout 04/29/2011  . Hypertensive chronic kidney disease with stage 5 chronic kidney disease or end stage renal disease ([place]) 04/29/2011    Past Surgical History:  Procedure Laterality Date  . A/V SHUNTOGRAM Left 03/19/2019   Procedure: A/V SHUNTOGRAM;  Surgeon: Katha Cabal, MD;  Location: [place] [facility];  Service: Cardiovascular;  Laterality: Left;  . ABDOMINAL HYSTERECTOMY  1992  . APPENDECTOMY  06/1990  . AV FISTULA PLACEMENT  07/2009  left upper arm  . AV FISTULA PLACEMENT Right 09/06/2016   Procedure: RIGHT FOREARM ARTERIOVENOUS (AV) GRAFT;  Surgeon: Elam Dutch, MD;  Location: [facility];  Service: Vascular;  Laterality: Right;  . AV FISTULA PLACEMENT N/A 02/24/2017   Procedure: INSERTION OF ARTERIOVENOUS (AV) GORE-TEX GRAFT ARM (BRACHIAL AXILLARY);  Surgeon: Katha Cabal, MD;  Location: [facility] ORS;  Service: Vascular;  Laterality: N/A;  . [place] Right 09/06/2016   Procedure: REMOVAL OF Right Arm ARTERIOVENOUS GORETEX GRAFT and Vein Patch angioplasty of brachial artery;  Surgeon: Angelia Mould, MD;  Location: [place];  Service: Vascular;  Laterality: Right;  . BIOPSY  09/26/2019   Procedure: BIOPSY;  Surgeon: Rogene Houston, MD;  [facility];  Service: Endoscopy;;  . COLON RESECTION  1992  . COLON SURGERY    . COLONOSCOPY  N/A 03/09/2019   Procedure: COLONOSCOPY;  Surgeon: Rogene Houston, MD;  [facility];  Service: Endoscopy;  Laterality: N/A;  . CORONARY ANGIOPLASTY WITH STENT PLACEMENT  12/15/11   "2"  . CORONARY ANGIOPLASTY WITH STENT PLACEMENT  y/2013   "1; makes total of 3" (05/02/2012)  . CORONARY ARTERY BYPASS GRAFT  06/13/2012   Procedure: CORONARY ARTERY BYPASS GRAFTING (CABG);  Surgeon: Grace Isaac, MD;  Location: [place];  Service: Open Heart Surgery;  Laterality: N/A;  cabg x four;  using left internal mammary artery, and left leg greater saphenous vein harvested endoscopically  . CORONARY STENT INTERVENTION N/A 10/13/2016   Procedure: Coronary Stent Intervention;  Surgeon: Troy Sine, MD;  Location: [place] [facility];  Service: Cardiovascular;  Laterality: N/A;  . DIALYSIS/PERMA CATHETER REMOVAL N/A 04/18/2017   Procedure: DIALYSIS/PERMA CATHETER REMOVAL;  Surgeon: Katha Cabal, MD;  Location: [place];  Service: Cardiovascular;  Laterality: N/A;  . DILATION AND CURETTAGE OF UTERUS    . ESOPHAGOGASTRODUODENOSCOPY  01/20/2012   Procedure: ESOPHAGOGASTRODUODENOSCOPY (EGD);  Surgeon: Ladene Artist, MD,FACG;  Location: [facility] ENDOSCOPY;  Service: Endoscopy;  Laterality: N/A;  . ESOPHAGOGASTRODUODENOSCOPY N/A 03/26/2013   Procedure: ESOPHAGOGASTRODUODENOSCOPY (EGD);  Surgeon: Irene Shipper, MD;  Location: [facility] ENDOSCOPY;  Service: Endoscopy;  Laterality: N/A;  . ESOPHAGOGASTRODUODENOSCOPY N/A 04/30/2015   Procedure: ESOPHAGOGASTRODUODENOSCOPY (EGD);  Surgeon: Rogene Houston, MD;  [facility];  Service: Endoscopy;  Laterality: N/A;  1pm - moved to 10/20 @ 1:10  . ESOPHAGOGASTRODUODENOSCOPY N/A 07/29/2016   Procedure: ESOPHAGOGASTRODUODENOSCOPY (EGD);  Surgeon: Manus Gunning, MD;  Location: [place];  Service: Gastroenterology;  Laterality: N/A;  enteroscopy  . ESOPHAGOGASTRODUODENOSCOPY N/A 09/26/2019   Procedure: ESOPHAGOGASTRODUODENOSCOPY (EGD);   Surgeon: Rogene Houston, MD;  [facility];  Service: Endoscopy;  Laterality: N/A;  1250  . GIVENS CAPSULE STUDY N/A 03/07/2019   Procedure: GIVENS CAPSULE STUDY;  Surgeon: Rogene Houston, MD;  [facility];  Service: Endoscopy;  Laterality: N/A;  7:30  . INTRAOPERATIVE TRANSESOPHAGEAL ECHOCARDIOGRAM  06/13/2012   Procedure: INTRAOPERATIVE TRANSESOPHAGEAL ECHOCARDIOGRAM;  Surgeon: Grace Isaac, MD;  Location: [place];  Service: Open Heart Surgery;  Laterality: N/A;  . IR FLUORO GUIDE CV LINE RIGHT  06/17/2020  . IR GENERIC HISTORICAL  07/26/2016   IR FLUORO GUIDE CV LINE RIGHT 07/26/2016 Greggory Keen, MD [facility]  . IR GENERIC HISTORICAL  07/26/2016   IR US GUIDE VASC ACCESS RIGHT 07/26/2016 Greggory Keen, MD [facility]  . IR GENERIC HISTORICAL  08/02/2016   IR US GUIDE VASC ACCESS RIGHT 08/02/2016 Greggory Keen,  MD [facility]  . IR GENERIC HISTORICAL  08/02/2016   IR FLUORO GUIDE CV LINE RIGHT 08/02/2016 Greggory Keen, MD [facility]  . IR RADIOLOGY PERIPHERAL GUIDED IV START  03/28/2017  . IR REMOVAL TUN CV CATH W/O FL  08/11/2020  . IR THROMBECTOMY AV FISTULA W/THROMBOLYSIS INC/SHUNT/IMG LEFT Left 06/17/2020  . IR US GUIDE VASC ACCESS LEFT  06/17/2020  . IR US GUIDE VASC ACCESS RIGHT  03/28/2017  . IR US GUIDE VASC ACCESS RIGHT  06/17/2020  . LEFT HEART CATH AND CORONARY ANGIOGRAPHY N/A 09/20/2016   Procedure: Left Heart Cath and Coronary Angiography;  Surgeon: [name], MD;  Location: [place] [facility];  Service: Cardiovascular;  Laterality: N/A;  . LEFT HEART CATH AND CORS/GRAFTS ANGIOGRAPHY N/A 10/13/2016   Procedure: Left Heart Cath and Cors/Grafts Angiography;  Surgeon: Troy Sine, MD;  Location: [place] [facility];  Service: Cardiovascular;  Laterality: N/A;  . LEFT HEART CATH AND CORS/GRAFTS ANGIOGRAPHY N/A 07/13/2018   Procedure: LEFT HEART CATH AND CORS/GRAFTS ANGIOGRAPHY;  Surgeon: Martinique, Peter M, MD;  Location: [place] [facility];  Service:  Cardiovascular;  Laterality: N/A;  . LEFT HEART CATHETERIZATION WITH CORONARY ANGIOGRAM N/A 12/15/2011   Procedure: LEFT HEART CATHETERIZATION WITH CORONARY ANGIOGRAM;  Surgeon: Burnell Blanks, MD;  Location: [facility] CATH LAB;  Service: Cardiovascular;  Laterality: N/A;  . LEFT HEART CATHETERIZATION WITH CORONARY ANGIOGRAM N/A 01/10/2012   Procedure: LEFT HEART CATHETERIZATION WITH CORONARY ANGIOGRAM;  Surgeon: Peter M Martinique, MD;  Location: [facility] CATH LAB;  Service: Cardiovascular;  Laterality: N/A;  . LEFT HEART CATHETERIZATION WITH CORONARY ANGIOGRAM N/A 06/08/2012   Procedure: LEFT HEART CATHETERIZATION WITH CORONARY ANGIOGRAM;  Surgeon: Burnell Blanks, MD;  Location: [facility] CATH LAB;  Service: Cardiovascular;  Laterality: N/A;  . LEFT HEART CATHETERIZATION WITH CORONARY/GRAFT ANGIOGRAM N/A 12/10/2013   Procedure: LEFT HEART CATHETERIZATION WITH Beatrix Fetters;  Surgeon: [name], MD;  Location: [facility] CATH LAB;  Service: Cardiovascular;  Laterality: N/A;  . OVARY SURGERY     ovarian cancer  . POLYPECTOMY  03/09/2019   Procedure: POLYPECTOMY;  Surgeon: Rogene Houston, MD;  [facility];  Service: Endoscopy;;  cecal   . POLYPECTOMY N/A 09/26/2019   Procedure: DUODENAL POLYPECTOMY;  Surgeon: Rogene Houston, MD;  [facility];  Service: Endoscopy;  Laterality: N/A;  . REVISION OF ARTERIOVENOUS GORETEX GRAFT N/A 02/24/2017   Procedure: REVISION OF ARTERIOVENOUS GORETEX GRAFT (RESECTION);  Surgeon: Katha Cabal, MD;  Location: [facility] ORS;  Service: Vascular;  Laterality: N/A;  . REVISON OF ARTERIOVENOUS FISTULA Left 06/19/2020   Procedure: REVISION OF LEFT UPPER ARM AV GRAFT WITH INTERPOSITION JUMP GRAFT USING 6MM GORE LIMB;  Surgeon: Marty Heck, MD;  Location: [place];  Service: Vascular;  Laterality: Left;  . SHUNTOGRAM N/A 10/15/2013   Procedure: Fistulogram;  Surgeon: Serafina [place], MD;  Location: [facility] CATH LAB;  Service: Cardiovascular;   Laterality: N/A;  . THROMBECTOMY / ARTERIOVENOUS GRAFT REVISION  2011   left upper arm  . TUBAL LIGATION  1980's  . UPPER EXTREMITY ANGIOGRAPHY Bilateral 12/06/2016   Procedure: Upper Extremity Angiography;  Surgeon: Katha Cabal, MD;  Location: [place] [facility];  Service: Cardiovascular;  Laterality: Bilateral;  . UPPER EXTREMITY INTERVENTION Left 06/06/2017   Procedure: UPPER EXTREMITY INTERVENTION;  Surgeon: Katha Cabal, MD;  Location: [place] [facility];  Service: Cardiovascular;  Laterality: Left;     OB History    Gravida  2   Para  2   Term      Preterm  2   AB      Living  2     SAB      IAB      Ectopic      Multiple      Live Births              Family History  Problem Relation Age of Onset  . Heart disease Mother        Heart Disease before age 48  . Hyperlipidemia Mother   . Hypertension Mother   . Diabetes Mother   . Heart attack Mother   . Heart disease Father        Heart Disease before age 5  . Hyperlipidemia Father   . Hypertension Father   . Diabetes Father   . Diabetes Sister   . Hypertension Sister   . Diabetes Brother   . Hyperlipidemia Brother   . Heart attack Brother   . Hypertension Sister   . Heart attack Brother   . Colon cancer Child 52  . Other Other        noncontributory for early CAD  . Esophageal cancer Neg Hx   . Liver disease Neg Hx   . Kidney disease Neg Hx   . Colon polyps Neg Hx     Social History   Tobacco Use  . Smoking status: Never Smoker  . Smokeless tobacco: Never Used  Vaping Use  . Vaping Use: Never used  Substance Use Topics  . Alcohol use: No    Alcohol/week: 0.0 standard drinks  . Drug use: No    Home Medications Prior to Admission medications   Medication Sig Start Date End Date Taking? Authorizing Provider  amiodarone (PACERONE) 200 MG tablet Take 1 tablet (200 mg total) by mouth daily. Starting July 2 12/17/19  Yes Strader, Fransisco Hertz, PA-C  aspirin EC 81 MG tablet  Take 81 mg by mouth daily.  09/27/19  Yes Rehman, Mechele Dawley, MD  fluticasone (FLONASE) 50 MCG/ACT nasal spray Place 1 spray into both nostrils at bedtime as needed for allergies.    Yes [provider]  isosorbide mononitrate (IMDUR) 30 MG 24 hr tablet Take 3 tablets (90 mg total) by mouth 2 (two) times daily. 09/29/20  Yes Shahmehdi, Valeria Batman, MD  lidocaine-prilocaine (EMLA) cream Apply 1 application topically every Monday, Wednesday, and Friday. Prior to dialysis   Yes [provider]  loratadine (CLARITIN) 10 MG tablet Take 1 tablet (10 mg total) by mouth daily as needed for allergies. 07/14/18  Yes Hosie Poisson, MD  multivitamin (RENA-VIT) TABS tablet Take 1 tablet by mouth daily.   Yes [provider]  nitroGLYCERIN (NITROSTAT) 0.4 MG SL tablet Place 1 tablet (0.4 mg total) under the tongue every 5 (five) minutes x 3 doses as needed for chest pain (if no relief after 2nd dose, proceed to the ED for an evaluation or call 911). 09/08/20  Yes Verta Ellen., NP  omeprazole (PRILOSEC) 40 MG capsule Take 1 capsule (40 mg total) by mouth daily before breakfast. 03/31/20  Yes Laurine Blazer B, PA-C  sevelamer carbonate (RENVELA) 800 MG tablet Take 2 tablets (1,600 mg total) by mouth 3 (three) times daily with meals. 06/20/20  Yes Meccariello, Bernita Raisin, DO  simvastatin (ZOCOR) 20 MG tablet Take 1 tablet (20 mg total) by mouth at bedtime. 07/07/20  Yes Verta Ellen., NP  Allergies    Aspirin, Penicillins, Amlodipine, Bactrim [sulfamethoxazole-trimethoprim], Contrast media [iodinated diagnostic agents], Iron, Nitrofurantoin, Tylenol [acetaminophen], Gabapentin, Hydralazine, Ranexa [ranolazine], Dexilant [dexlansoprazole], Levaquin [levofloxacin in d5w], Morphine and related, Plavix [clopidogrel bisulfate], Protonix [pantoprazole sodium], and Venofer [ferric oxide]  Review of Systems   Review of Systems  All other systems reviewed and are negative. Ten systems reviewed  and are negative for acute change, except as noted in the HPI.   Physical Exam Updated Vital Signs BP (!) 211/69   Pulse 88   Temp 98.2 F (36.8 C) (Oral)   Resp 19   Ht 5\' 1"  (1.549 m)   Wt 62.7 kg   SpO2 100%   BMI 26.12 kg/m   Physical Exam Vitals and nursing note reviewed.  Constitutional:      General: She is not in acute distress.    Appearance: Normal appearance. She is not ill-appearing, toxic-appearing or diaphoretic.  HENT:     Head: Normocephalic and atraumatic.     Right Ear: External ear normal.     Left Ear: External ear normal.     Nose: Nose normal.     Mouth/Throat:     Mouth: Mucous membranes are moist.     Pharynx: Oropharynx is clear. No oropharyngeal exudate or posterior oropharyngeal erythema.  Eyes:     Extraocular Movements: Extraocular movements intact.  Cardiovascular:     Rate and Rhythm: Normal rate and regular rhythm.     Pulses: Normal pulses.     Heart sounds: Normal heart sounds. No murmur heard. No friction rub. No gallop.      Comments: AV fistula noted in the left upper extremity.  Strong palpable thrill. Pulmonary:     Effort: Pulmonary effort is normal. No respiratory distress.     Breath sounds: No stridor. Rales present. No wheezing or rhonchi.  Abdominal:     General: Abdomen is flat.     Palpations: Abdomen is soft.     Tenderness: There is no abdominal tenderness.  Musculoskeletal:        General: Normal range of motion.     Cervical back: Normal range of motion and neck supple. No tenderness.  Skin:    General: Skin is warm and dry.  Neurological:     General: No focal deficit present.     Mental Status: She is alert and oriented to person, place, and time.  Psychiatric:        Mood and Affect: Mood normal.        Behavior: Behavior normal.    ED Results / Procedures / Treatments   Labs (all labs ordered are listed, but only abnormal results are displayed) Labs Reviewed  BASIC METABOLIC PANEL - Abnormal; Notable for  the following components:      Result Value   BUN 56 (*)    Creatinine, Ser 10.03 (*)    Calcium 10.6 (*)    GFR, Estimated 4 (*)    Anion gap 16 (*)    All other components within normal limits  CBC WITH DIFFERENTIAL/PLATELET - Abnormal; Notable for the following components:   RBC 2.82 (*)    Hemoglobin 10.2 (*)    HCT 34.8 (*)    MCV 123.4 (*)    MCH 36.2 (*)    MCHC 29.3 (*)    RDW 19.1 (*)    Eosinophils Absolute 0.8 (*)    All other components within normal limits  RESP PANEL BY RT-PCR (FLU A&B, COVID) [id number]   EKG None  Radiology DG Chest Portable 1 View  Result Date: 10/03/2020 CLINICAL DATA:  Shortness of breath for several hours, has not had dialysis treatment. History CHF, hypertension, chronic renal insufficiency, coronary artery disease post MI EXAM: PORTABLE CHEST 1 VIEW COMPARISON:  Portable exam 1455 hours compared to 09/29/2020 FINDINGS: Enlargement of cardiac silhouette post CABG and coronary stenting. Mild pulmonary vascular congestion. Mediastinal contours normal with atherosclerotic calcification aorta noted. Minimal interstitial prominence bilaterally, likely reflecting minimal failure. Subsegmental atelectasis bibasilar greater on LEFT. No pleural effusion or pneumothorax. Bones demineralized. IMPRESSION: Enlargement of cardiac silhouette with pulmonary vascular congestion and question mild pulmonary edema/failure. Post CABG and coronary stenting. Scattered atelectasis greater at LEFT base. Aortic Atherosclerosis (ICD10-I70.0). Electronically Signed   By: Lavonia Dana M.D.   On: 10/03/2020 15:03    Procedures Procedures   Medications Ordered in ED Medications  Chlorhexidine Gluconate Cloth 2 % PADS 6 each (has no administration in time range)    ED Course  I have reviewed the triage vital signs and the nursing notes.  Pertinent labs & imaging results that were available during my care of the patient were reviewed by me and considered in my medical decision  making (see chart for details).    MDM Rules/Calculators/A&P                          Pt is a 80 y.o. female who presents the emergency department due to clots in her dialysis line while at dialysis earlier today.  Labs: Potassium of 4.5, BUN of 56, creatinine of 10.03, calcium of 10.6, GFR 4, anion gap of 16.  CXR: Showing enlargement of the cardiac silhouette with pulmonary vascular congestion and question mild pulmonary edema/failure.  I, Rayna Sexton, PA-C, personally reviewed and evaluated these images and lab results as part of my medical decision-making.  Patient initially evaluated in the emergency department on March 22 for unstable angina.  She was discharged the same day.  States her symptoms have resolved and has no complaints at this time.  She was incidentally found to be Covid positive at the prior visit.  Due to this, she was started a new dialysis center.  She states that while at the dialysis center this morning, they noticed clots in the line.  She was immediately sent to the emergency department and did not finish her dialysis session.  I discussed patient with nephrology.  They felt initially that it was reasonable to discharge her. She has a strong thrill in her AV fistula.  It does not appear to be occluded.  Potassium within normal limits of 4.5. Nephrology feels that it is reasonable to d/c the patient home and have her follow back up with her dialysis center.    At discharge pt now complains of SOB. O2 saturations in the mid to high 90s on room air. CXR is showing enlargement of the cardiac silhouette with pulmonary vascular congestion question mild pulmonary edema/failure.  Scattered atelectasis at the left base. BP elevated at 215/73.   Patient discussed with Dr. Melvia Heaps with nephrology once again.  He will have staff dialyze the patient in the emergency department today.  If patient is able to have a normal session, she can be discharged home.  If there is  further complication or pt sx not improved, she will likely need to be admitted to the hospital.  Patient's AV fistula is no longer functional.  Nephrology requested that we place a temporary femoral hemodialysis catheter  in the meantime.  Patient will need admission for dialysis as well as having a HD catheter placed.  Respiratory panel ordered.  Patient discussed with Dr. Arnoldo Morale with general surgery.  He states that his colleague Dr. Constance Haw can place an HD catheter.  My attending physician Dr. Noemi Chapel will place a temporary femoral catheter and patient will be dialyzed.  Patient will need to be admitted to the hospital and will have the HD catheter placed by surgery on Monday or Tuesday.  This was discussed with the medicine team and they are agreeable with the above plan.  Note: Portions of this report may have been transcribed using voice recognition software. Every effort was made to ensure accuracy; however, inadvertent computerized transcription errors may be present.   Final Clinical Impression(s) / ED Diagnoses Final diagnoses:  Dialysis AV fistula malfunction, initial encounter [facility])   Rx / DC Orders ED Discharge Orders    None       Rayna Sexton, PA-C 10/03/20 1416    Rayna Sexton, PA-C 10/03/20 1823    Noemi Chapel, MD 10/03/20 [phone]

## 2020-10-03 NOTE — Progress Notes (Signed)
Called for this patient who could not get HD this am at her HD unit [facility]). Per the ED provider, there were "clots in the line" so the session was stopped early and patient was sent to [facility] ED.  Labs show K+ 4.5, creat 10. Per ED the patient looks stable clinically. Family is refusing dc home and want patient to be dialyzed. Will plan HD at [facility] this afternoon or this evening at the latest.  If pt can be dialyzed effectively recommend reassessment by ED then dc home.  If not able to dialyze, probably best option would be to admit.  Have d/w ED provider.   Kelly Splinter, MD [facility] 10/03/2020, 3:07 PM

## 2020-10-03 NOTE — ED Triage Notes (Addendum)
Pt was sent to ED from Dialysis due blood clots in her left arm fistula.  Pt was unable to complete dialysis today.  Pt denies covid positive dx despite positive test on 09/29/20. She states her PCP retested and she was negative.  Normal dialysis days ar MWF

## 2020-10-04 ENCOUNTER — Other Ambulatory Visit: Payer: Self-pay

## 2020-10-04 LAB — BASIC METABOLIC PANEL
Anion gap: 12 (ref 5–15)
BUN: 35 mg/dL — ABNORMAL HIGH (ref 8–23)
CO2: 26 mmol/L (ref 22–32)
Calcium: 9.6 mg/dL (ref 8.9–10.3)
Chloride: 101 mmol/L (ref 98–111)
Creatinine, Ser: 7.48 mg/dL — ABNORMAL HIGH (ref 0.44–1.00)
GFR, Estimated: 5 mL/min — ABNORMAL LOW (ref >60)
Glucose, Bld: 82 mg/dL (ref 70–99)
Potassium: 4.8 mmol/L (ref 3.5–5.1)
Sodium: 139 mmol/L (ref 135–145)

## 2020-10-04 LAB — MRSA PCR SCREENING: MRSA by PCR: NEGATIVE

## 2020-10-04 MED ORDER — CHLORHEXIDINE GLUCONATE CLOTH 2 % EX PADS: 6.0000 | MEDICATED_PAD | Freq: Once | CUTANEOUS | Status: AC

## 2020-10-04 MED ORDER — CHLORHEXIDINE GLUCONATE CLOTH 2 % EX PADS
6.0000 | MEDICATED_PAD | Freq: Once | CUTANEOUS | Status: AC
  Administered 2020-10-04: 6 via TOPICAL

## 2020-10-04 NOTE — Progress Notes (Signed)
PROGRESS NOTE    BRITA JURGENSEN  MPN:[id number] DOB: [birth year] DOA: 10/03/2020 PCP: [facility]   Brief Narrative:  Per HPI: Shawna [name] is a 80 y.o. female with a history of end-stage renal disease on hemodialysis Mondays, Wednesday, Friday, history of NSTEMI, coronary artery disease, paroxysmal atrial fibrillation rhythm controlled on amiodarone and not on anticoagulation, history of TIA.  Patient had recent Covid infection, now cleared and had a shift in her dialysis schedule.  She received dialysis on Thursday went to dialysis this morning.  At the commencement of dialysis this morning, she developed blood clots in the line and dialysis was discontinued.  She was sent to the emergency department for evaluation.  She has some mild tenderness to the area with no palliating or provoking factors.  She is otherwise asymptomatic.  She is still in need of dialysis.  Assessment & Plan:   Active Problems:   GERD (gastroesophageal reflux disease)   Chronic diastolic CHF (congestive heart failure) (HCC)   Essential hypertension   ESRD (end stage renal disease) on dialysis (HCC)   Fluid overload   H/O non-ST elevation myocardial infarction (NSTEMI)   Paroxysmal atrial fibrillation (HCC)   History of GI bleed   Failure of surgically constructed arteriovenous fistula (HCC)   Vasovagal episode   Failure of surgically constructed AV fistula -General surgery for hemodialysis catheter placement on Monday  Volume overload in setting of ESRD with HD -Hemodialysis performed with temporary dialysis catheter to  R femoral region -Hemodialysis per nephrology -Patient has further hemodialysis session scheduled for Tuesday and Thursday of this week for 2 more sessions and then is to continue on her usual dialysis site MWF  Paroxysmal atrial fibrillation -Currently in sinus rhythm -Continue amiodarone  History of hypertension -Continue home Imdur  History of NSTEMI -Continue  aspirin and statin  Chronic diastolic heart failure -Currently compensated  GERD  DVT prophylaxis: SCDs Code Status: Full Family Communication: Discussed with husband 3/27 on phone Disposition Plan:  Status is: Inpatient  Remains inpatient appropriate because:IV treatments appropriate due to intensity of illness or inability to take PO and Inpatient level of care appropriate due to severity of illness   Dispo: The patient is from: Home              Anticipated d/c is to: Home              Patient currently is not medically stable to d/c.   Difficult to place patient No  Consultants:   Nephrology  General Surgery  Procedures:   See below  Antimicrobials:  Anti-infectives (From admission, onward)   None      Subjective: Patient seen and evaluated today with no new acute complaints or concerns. No acute concerns or events noted overnight.  Objective: Vitals:   10/04/20 0600 10/04/20 0700 10/04/20 0711 10/04/20 0820  BP: (!) 114/22 (!) 116/18    Pulse: 69 66 73   Resp: 20 19 15    Temp:   99 F (37.2 C)   TempSrc:   Oral   SpO2: 100% 100% 100% 100%  Weight:      Height:        Intake/Output Summary (Last 24 hours) at 10/04/2020 1002 Last data filed at 10/03/2020 2310 Gross per 24 hour  Intake -  Output 1000 ml  Net -1000 ml   Filed Weights   10/03/20 1117 10/03/20 2000 10/03/20 2304  Weight: 62.7 kg 64 kg 63.3 kg    Examination:  General exam: Appears calm and comfortable  Respiratory system: Clear to auscultation. Respiratory effort normal. Currently on 4L [place] Cardiovascular system: S1 & S2 heard, RRR.  Gastrointestinal system: Abdomen is soft Central nervous system: Alert and awake Extremities: No edema, right femoral HD catheter C/D/I Skin: No significant lesions noted Psychiatry: Flat affect.    Data Reviewed: I have personally reviewed following labs and imaging studies  CBC: Recent Labs  Lab 09/29/20 0112 10/03/20 1225  WBC 5.6 7.4   NEUTROABS 3.6 4.8  HGB 9.8* 10.2*  HCT 33.0* 34.8*  MCV 123.1* 123.4*  PLT 210 081   Basic Metabolic Panel: Recent Labs  Lab 09/29/20 0112 10/03/20 1225 10/04/20 0426  NA 139 144 139  K 4.3 4.5 4.8  CL 101 101 101  CO2 26 27 26   GLUCOSE 99 96 82  BUN 27* 56* 35*  CREATININE 5.44* 10.03* 7.48*  CALCIUM 9.7 10.6* 9.6   GFR: Estimated Creatinine Clearance: 5.1 mL/min (A) (by C-G formula based on SCr of 7.48 mg/dL (H)). Liver Function Tests: No results for input(s): AST, ALT, ALKPHOS, BILITOT, PROT, ALBUMIN in the last 168 hours. No results for input(s): LIPASE, AMYLASE in the last 168 hours. No results for input(s): AMMONIA in the last 168 hours. Coagulation Profile: No results for input(s): INR, PROTIME in the last 168 hours. Cardiac Enzymes: No results for input(s): CKTOTAL, CKMB, CKMBINDEX, TROPONINI in the last 168 hours. BNP (last 3 results) No results for input(s): PROBNP in the last 8760 hours. HbA1C: No results for input(s): HGBA1C in the last 72 hours. CBG: No results for input(s): GLUCAP in the last 168 hours. Lipid Profile: No results for input(s): CHOL, HDL, LDLCALC, TRIG, CHOLHDL, LDLDIRECT in the last 72 hours. Thyroid Function Tests: No results for input(s): TSH, T4TOTAL, FREET4, T3FREE, THYROIDAB in the last 72 hours. Anemia Panel: No results for input(s): VITAMINB12, FOLATE, FERRITIN, TIBC, IRON, RETICCTPCT in the last 72 hours. Sepsis Labs: Recent Labs  Lab 09/29/20 0656  PROCALCITON 0.64    Recent Results (from the past 240 hour(s))  Resp Panel by RT-PCR (Flu A&B, Covid) Nasopharyngeal Swab     Status: Abnormal   Collection Time: 09/29/20  3:21 AM   Specimen: Nasopharyngeal Swab; Nasopharyngeal(NP) swabs in vial transport medium  Result Value Ref Range Status   SARS Coronavirus 2 by RT PCR POSITIVE (A) NEGATIVE Final    Comment: RESULT CALLED TO, READ BACK BY AND VERIFIED WITH: K NICHOLS,RN@0511  09/29/20 MKELLY (NOTE) SARS-CoV-2 target  nucleic acids are DETECTED.  The SARS-CoV-2 RNA is generally detectable in upper respiratory specimens during the acute phase of infection. Positive results are indicative of the presence of the identified virus, but do not rule out bacterial infection or co-infection with other pathogens not detected by the test. Clinical correlation with patient history and other diagnostic information is necessary to determine patient infection status. The expected result is Negative.  Fact Sheet for Patients: EntrepreneurPulse.com.au  Fact Sheet for Healthcare Providers: IncredibleEmployment.be  This test is not yet approved or cleared by the [place] FDA and  has been authorized for detection and/or diagnosis of SARS-CoV-2 by FDA under an Emergency Use Authorization (EUA).  This EUA will remain in effect (meaning this test can be  used) for the duration of  the COVID-19 declaration under Section 564(b)(1) of the Act, 21 U.S.C. section 360bbb-3(b)(1), unless the authorization is terminated or revoked sooner.     Influenza A by PCR NEGATIVE NEGATIVE Final   Influenza B by PCR NEGATIVE  NEGATIVE Final    Comment: (NOTE) The Xpert Xpress SARS-CoV-2/FLU/RSV plus assay is intended as an aid in the diagnosis of influenza from Nasopharyngeal swab specimens and should not be used as a sole basis for treatment. Nasal washings and aspirates are unacceptable for Xpert Xpress SARS-CoV-2/FLU/RSV testing.  Fact Sheet for Patients: EntrepreneurPulse.com.au  Fact Sheet for Healthcare Providers: IncredibleEmployment.be  This test is not yet approved or cleared by the [place] FDA and has been authorized for detection and/or diagnosis of SARS-CoV-2 by FDA under an Emergency Use Authorization (EUA). This EUA will remain in effect (meaning this test can be used) for the duration of the COVID-19 declaration under Section  564(b)(1) of the Act, 21 U.S.C. section 360bbb-3(b)(1), unless the authorization is terminated or revoked.  Performed at [facility], [address], [place], [place] [address]   Resp Panel by RT-PCR (Flu A&B, Covid) Nasopharyngeal Swab     Status: None   Collection Time: 10/03/20  6:05 PM   Specimen: Nasopharyngeal Swab; Nasopharyngeal(NP) swabs in vial transport medium  Result Value Ref Range Status   SARS Coronavirus 2 by RT PCR NEGATIVE NEGATIVE Final    Comment: (NOTE) SARS-CoV-2 target nucleic acids are NOT DETECTED.  The SARS-CoV-2 RNA is generally detectable in upper respiratory specimens during the acute phase of infection. The lowest concentration of SARS-CoV-2 viral copies this assay can detect is 138 copies/mL. A negative result does not preclude SARS-Cov-2 infection and should not be used as the sole basis for treatment or other patient management decisions. A negative result may occur with  improper specimen collection/handling, submission of specimen other than nasopharyngeal swab, presence of viral mutation(s) within the areas targeted by this assay, and inadequate number of viral copies(<138 copies/mL). A negative result must be combined with clinical observations, patient history, and epidemiological information. The expected result is Negative.  Fact Sheet for Patients:  EntrepreneurPulse.com.au  Fact Sheet for Healthcare Providers:  IncredibleEmployment.be  This test is no t yet approved or cleared by the [place] FDA and  has been authorized for detection and/or diagnosis of SARS-CoV-2 by FDA under an Emergency Use Authorization (EUA). This EUA will remain  in effect (meaning this test can be used) for the duration of the COVID-19 declaration under Section 564(b)(1) of the Act, 21 U.S.C.section 360bbb-3(b)(1), unless the authorization is terminated  or revoked sooner.       Influenza A by PCR NEGATIVE NEGATIVE  Final   Influenza B by PCR NEGATIVE NEGATIVE Final    Comment: (NOTE) The Xpert Xpress SARS-CoV-2/FLU/RSV plus assay is intended as an aid in the diagnosis of influenza from Nasopharyngeal swab specimens and should not be used as a sole basis for treatment. Nasal washings and aspirates are unacceptable for Xpert Xpress SARS-CoV-2/FLU/RSV testing.  Fact Sheet for Patients: EntrepreneurPulse.com.au  Fact Sheet for Healthcare Providers: IncredibleEmployment.be  This test is not yet approved or cleared by the [place] FDA and has been authorized for detection and/or diagnosis of SARS-CoV-2 by FDA under an Emergency Use Authorization (EUA). This EUA will remain in effect (meaning this test can be used) for the duration of the COVID-19 declaration under Section 564(b)(1) of the Act, 21 U.S.C. section 360bbb-3(b)(1), unless the authorization is terminated or revoked.  Performed at [facility], [address], [place], [place] [address]   MRSA PCR Screening     Status: None   Collection Time: 10/03/20 11:00 PM   Specimen: Nasopharyngeal  Result Value Ref Range Status   MRSA by PCR  NEGATIVE NEGATIVE Final    Comment:        The GeneXpert MRSA Assay (FDA approved for NASAL specimens only), is one component of a comprehensive MRSA colonization surveillance program. It is not intended to diagnose MRSA infection nor to guide or monitor treatment for MRSA infections. Performed at [facility], [address] [place], [place] [address]          Radiology Studies: DG Chest Portable 1 View  Result Date: 10/03/2020 CLINICAL DATA:  Shortness of breath for several hours, has not had dialysis treatment. History CHF, hypertension, chronic renal insufficiency, coronary artery disease post MI EXAM: PORTABLE CHEST 1 VIEW COMPARISON:  Portable exam 1455 hours compared to 09/29/2020 FINDINGS: Enlargement of cardiac silhouette post CABG and coronary  stenting. Mild pulmonary vascular congestion. Mediastinal contours normal with atherosclerotic calcification aorta noted. Minimal interstitial prominence bilaterally, likely reflecting minimal failure. Subsegmental atelectasis bibasilar greater on LEFT. No pleural effusion or pneumothorax. Bones demineralized. IMPRESSION: Enlargement of cardiac silhouette with pulmonary vascular congestion and question mild pulmonary edema/failure. Post CABG and coronary stenting. Scattered atelectasis greater at LEFT base. Aortic Atherosclerosis (ICD10-I70.0). Electronically Signed   By: Lavonia Dana M.D.   On: 10/03/2020 15:03        Scheduled Meds: . amiodarone  200 mg Oral Daily  . aspirin EC  81 mg Oral Daily  . Chlorhexidine Gluconate Cloth  6 each Topical Q0600  . isosorbide mononitrate  90 mg Oral BID  . mouth rinse  15 mL Mouth Rinse BID  . sevelamer carbonate  1,600 mg Oral TID WC  . simvastatin  20 mg Oral QHS    LOS: 1 day    Time spent: 35 minutes    Pratik Darleen Crocker, DO [facility]  If 7PM-7AM, please contact night-coverage [email or web address] 10/04/2020, 10:02 AM

## 2020-10-04 NOTE — Consult Note (Signed)
Dr. Constance Haw will place Permacath tomorrow.  Orders placed.

## 2020-10-04 NOTE — TOC Initial Note (Signed)
Transition of Care [facility]) - Initial/Assessment Note    Patient Details  Name: Shawna [name] MRN: [id number] Date of Birth: [birth year]  Transition of Care [facility]) CM/SW Contact:    Natasha Bence, LCSW Phone Number: 10/04/2020, 12:16 PM  Clinical Narrative:                 Patient is a 80 year female admitted for Failure of surgically constructed arteriovenous fistula. CSW observed patient's high readmission risk score. CSW contacted readmission risk assessment. CSW also conducted intial assment. Patient is a [place] resident and reports that she lives in a single family home with her husband. Patient also reported that at baseline she is able to perform her ADL's independent.y. Patient expressed that she is agreeable to [facility] or SNF referral if recommended. TOC to follow.  Expected Discharge Plan: Home/Self Care Barriers to Discharge: Continued Medical Work up   Patient Goals and CMS Choice Patient states their goals for this hospitalization and ongoing recovery are:: Return home CMS Medicare.gov Compare Post Acute Care list provided to:: Patient Choice offered to / list presented to : Patient  Expected Discharge Plan and Services Expected Discharge Plan: Home/Self Care In-house Referral: NA Discharge Planning Services: NA Post Acute Care Choice: NA Living arrangements for the past 2 months: Single Family Home                 DME Arranged: N/A DME Agency: NA       HH Arranged: NA [place] Agency: NA        Prior Living Arrangements/Services Living arrangements for the past 2 months: Single Family Home Lives with:: Spouse,Self Patient language and need for interpreter reviewed:: Yes Do you feel safe going back to the place where you live?: Yes      Need for Family Participation in Patient Care: Yes (Comment) Care giver support system in place?: Yes (comment) Current home services: DME Kasandra Knudsen) Criminal Activity/Legal Involvement Pertinent to Current Situation/Hospitalization: No - Comment  as needed  Activities of Daily Living Home Assistive Devices/Equipment: Scales ADL Screening (condition at time of admission) Patient's cognitive ability adequate to safely complete daily activities?: Yes Is the patient deaf or have difficulty hearing?: No Does the patient have difficulty seeing, even when wearing glasses/contacts?: No Does the patient have difficulty concentrating, remembering, or making decisions?: No Patient able to express need for assistance with ADLs?: Yes Does the patient have difficulty dressing or bathing?: No Independently performs ADLs?: Yes (appropriate for developmental age) Does the patient have difficulty walking or climbing stairs?: No Weakness of Legs: None Weakness of Arms/Hands: None  Permission Sought/Granted Permission sought to share information with : Family Supports Permission granted to share information with : Yes, Verbal Permission Granted  Share Information with NAME: Zaffino,Nathaniel  Permission granted to share info w AGENCY: [place] or SNF  Permission granted to share info w Relationship: (Spouse)  Permission granted to share info w Contact Information: [phone]  Emotional Assessment     Affect (typically observed): Accepting,Apprehensive,Appropriate Orientation: : Oriented to Self,Oriented to Situation,Oriented to Place,Oriented to  Time Alcohol / Substance Use: Not Applicable Psych Involvement: No (comment)  Admission diagnosis:  Acute pulmonary edema ([place]) [J81.0] Vasovagal episode [R55] Dialysis AV fistula malfunction, initial encounter ([place]) [T82.590A] Failure of surgically constructed arteriovenous fistula ([place]) [B35.329J] Patient Active Problem List   Diagnosis Date Noted  . Failure of surgically constructed arteriovenous fistula ([place]) 10/03/2020  . Vasovagal episode 10/03/2020  . Myoclonus 08/31/2020  . Clotted renal dialysis AV graft,  initial encounter ([place])   . Hypertensive heart and chronic kidney disease with heart  failure and stage 1 through stage 4 chronic kidney disease, or chronic kidney disease ([place])   . Hemodialysis-associated hypotension   . Acute hypoxemic respiratory failure ([place]) 06/14/2020  . Hypertension 06/12/2020  . Irritable bowel syndrome 02/25/2020  . Pulmonary edema 10/14/2019  . Adenomatous duodenal polyp 09/10/2019  . History of GI bleed 09/10/2019  . Angina pectoris ([place]) 06/05/2019  . Chest pain 06/03/2019  . Rectal bleeding 05/14/2019  . Small intestinal bacterial overgrowth 05/14/2019  . Iron deficiency anemia 04/02/2019  . GI bleed 03/08/2019  . Gastrointestinal hemorrhage with melena 03/06/2019  . Acute respiratory failure with hypoxia ([place]) 12/25/2018  . Elevated troponin 12/14/2018  . Chest pain at rest 07/13/2018  . Hand steal syndrome ([place]) 08/01/2017  . Macrocytic anemia 07/14/2017  . Coronary artery disease 06/05/2017  . Mesenteric ischemia ([address])   . Diverticulitis   . Enteritis   . Complication of vascular access for dialysis 03/19/2017  . Preoperative clearance 01/25/2017  . Symptomatic anemia 10/24/2016  . H/O non-ST elevation myocardial infarction (NSTEMI) 10/24/2016  . Fluid overload 10/10/2016  . Complication from renal dialysis device 10/10/2016  . Non-ST elevation MI (NSTEMI) ([place])   . Encounter for fitting and adjustment of vascular catheter   . Heme positive stool   . Demand ischemia ([place]) 07/27/2016  . Hypertensive emergency 07/08/2016  . Acute on chronic respiratory failure with hypoxia ([place])   . Cardiac arrest ([place])   . Palliative care encounter   . Goals of care, counseling/discussion   . Hypertensive crisis without congestive heart failure 05/09/2016  . Acute pulmonary edema ([place]) 04/06/2016  . Acute respiratory failure ([place]) 04/06/2016  . Hypertensive crisis 01/27/2016  . History of colon cancer 01/27/2016  . History of ovarian cancer 01/27/2016  . Hypertensive urgency 01/27/2016  . Paroxysmal atrial fibrillation ([place]) 10/14/2015  .  Coronary angioplasty status 10/14/2015  . Malignant neoplasm of right ovary ([place]) 10/14/2015  . Narrow complex tachycardia ([place]) 09/08/2015  . SVT (supraventricular tachycardia) ([place]) 09/08/2015  . Influenza A 08/30/2015  . Acute on chronic diastolic CHF (congestive heart failure) ([place]) 05/04/2015  . Unstable angina ([place]) 05/03/2015  . DOE (dyspnea on exertion)   . Essential hypertension   . Pain in joint, lower leg 08/14/2014  . Dacryocystitis 05/29/2013  . Chronic diastolic CHF (congestive heart failure) ([place]) 03/22/2013  . GI bleeding 03/21/2013  . Acute blood loss anemia 03/21/2013  . Vaginal odor 03/12/2013  . Vaginal discharge 03/12/2013  . Occlusion and stenosis of carotid artery without mention of cerebral infarction 01/24/2013  . Hx of CABG 07/05/2012  . Carotid artery disease ([place]) 07/05/2012  . Mitral regurgitation 06/12/2012  . Pneumonia 06/09/2012  . Non-STEMI (non-ST elevated myocardial infarction) ([place]) 06/08/2012  . Ischemic chest pain ([place]) 03/01/2012  . 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 GERD (gastroesophageal reflux disease) 01/09/2012  . HLD (hyperlipidemia) 01/05/2012  . Atherosclerotic heart disease of native coronary artery without angina pectoris 12/16/2011  . Essential hypertension, benign 12/16/2011  . ESRD on hemodialysis ([place]) 12/16/2011  . Anxiety disorder 05/04/2011  . Anemia in chronic kidney disease 04/29/2011  . Secondary hyperparathyroidism of renal origin ([place]) 04/29/2011  . ESRD (end stage renal disease) on dialysis ([place]) 04/29/2011  . Gout 04/29/2011  . Hypertensive chronic kidney disease with stage 5 chronic kidney disease or end stage renal disease ([place]) 04/29/2011   PCP:  [facility]:  [name] [address], [place] [address] Phone: [phone] Fax: [phone]  [facility] (ESRD Bundle Only) - [place], [address] [place] [address] Phone: [phone] Fax: [phone]     Social Determinants of Health ([place]) Interventions    Readmission Risk Interventions Readmission Risk Prevention Plan 10/04/2020 09/02/2020 06/15/2020  Transportation Screening Complete Complete Complete  PCP or Specialist Appt within 3-5 Days - - -  HRI or [place] for [place] - - -  Medication Review Press photographer) Complete Complete Complete  PCP or Specialist appointment within 3-5 days of discharge Complete - Complete  HRI or Home Care Consult Complete Complete Complete  SW Recovery Care/Counseling Consult Complete Complete Complete  Palliative Care Screening Not Applicable Not Applicable Not Applicable  Skilled Nursing Facility Complete Patient Refused Not Applicable  Some recent data might be hidden

## 2020-10-05 ENCOUNTER — Inpatient Hospital Stay (HOSPITAL_COMMUNITY): Payer: Medicare HMO | Admitting: Anesthesiology

## 2020-10-05 ENCOUNTER — Inpatient Hospital Stay (HOSPITAL_COMMUNITY): Payer: Medicare HMO

## 2020-10-05 ENCOUNTER — Encounter (HOSPITAL_COMMUNITY): Payer: Self-pay | Admitting: Family Medicine

## 2020-10-05 ENCOUNTER — Encounter (HOSPITAL_COMMUNITY)
Admission: EM | Disposition: A | Discharge status: Home / Self Care | Payer: Self-pay | Source: Ambulatory Visit | Attending: Internal Medicine

## 2020-10-05 DIAGNOSIS — I82C11 Acute embolism and thrombosis of right internal jugular vein: Secondary | ICD-10-CM

## 2020-10-05 DIAGNOSIS — Z992 Dependence on renal dialysis: Secondary | ICD-10-CM

## 2020-10-05 DIAGNOSIS — T82590A Other mechanical complication of surgically created arteriovenous fistula, initial encounter: Secondary | ICD-10-CM

## 2020-10-05 DIAGNOSIS — N186 End stage renal disease: Secondary | ICD-10-CM

## 2020-10-05 HISTORY — PX: INSERTION OF DIALYSIS CATHETER: SHX1324

## 2020-10-05 LAB — BASIC METABOLIC PANEL
Anion gap: 13 (ref 5–15)
BUN: 60 mg/dL — ABNORMAL HIGH (ref 8–23)
CO2: 25 mmol/L (ref 22–32)
Calcium: 9.6 mg/dL (ref 8.9–10.3)
Chloride: 99 mmol/L (ref 98–111)
Creatinine, Ser: 11.12 mg/dL — ABNORMAL HIGH (ref 0.44–1.00)
GFR, Estimated: 3 mL/min — ABNORMAL LOW (ref >60)
Glucose, Bld: 96 mg/dL (ref 70–99)
Potassium: 5.2 mmol/L — ABNORMAL HIGH (ref 3.5–5.1)
Sodium: 137 mmol/L (ref 135–145)

## 2020-10-05 LAB — CBC
HCT: 25.8 % — ABNORMAL LOW (ref 36.0–46.0)
Hemoglobin: 7.9 g/dL — ABNORMAL LOW (ref 12.0–15.0)
MCH: 37.1 pg — ABNORMAL HIGH (ref 26.0–34.0)
MCHC: 30.6 g/dL (ref 30.0–36.0)
MCV: 121.1 fL — ABNORMAL HIGH (ref 80.0–100.0)
Platelets: 197 10*3/uL (ref 150–400)
RBC: 2.13 MIL/uL — ABNORMAL LOW (ref 3.87–5.11)
RDW: 18.6 % — ABNORMAL HIGH (ref 11.5–15.5)
WBC: 7.3 10*3/uL (ref 4.0–10.5)
nRBC: 0 % (ref 0.0–0.2)

## 2020-10-05 LAB — MAGNESIUM: Magnesium: 1.9 mg/dL (ref 1.7–2.4)

## 2020-10-05 LAB — HEPATITIS B SURFACE ANTIGEN: Hepatitis B Surface Ag: NONREACTIVE

## 2020-10-05 IMAGING — DX DG CHEST 1V PORT
1 series · 1 of 1 positions shown · non-contrast
Comparison: Portable exam [A8] hours compared to [DATE]

CLINICAL DATA: Dialysis catheter placement was attempted but was
unsuccessful

EXAM:
PORTABLE CHEST 1 VIEW

[chest ap]
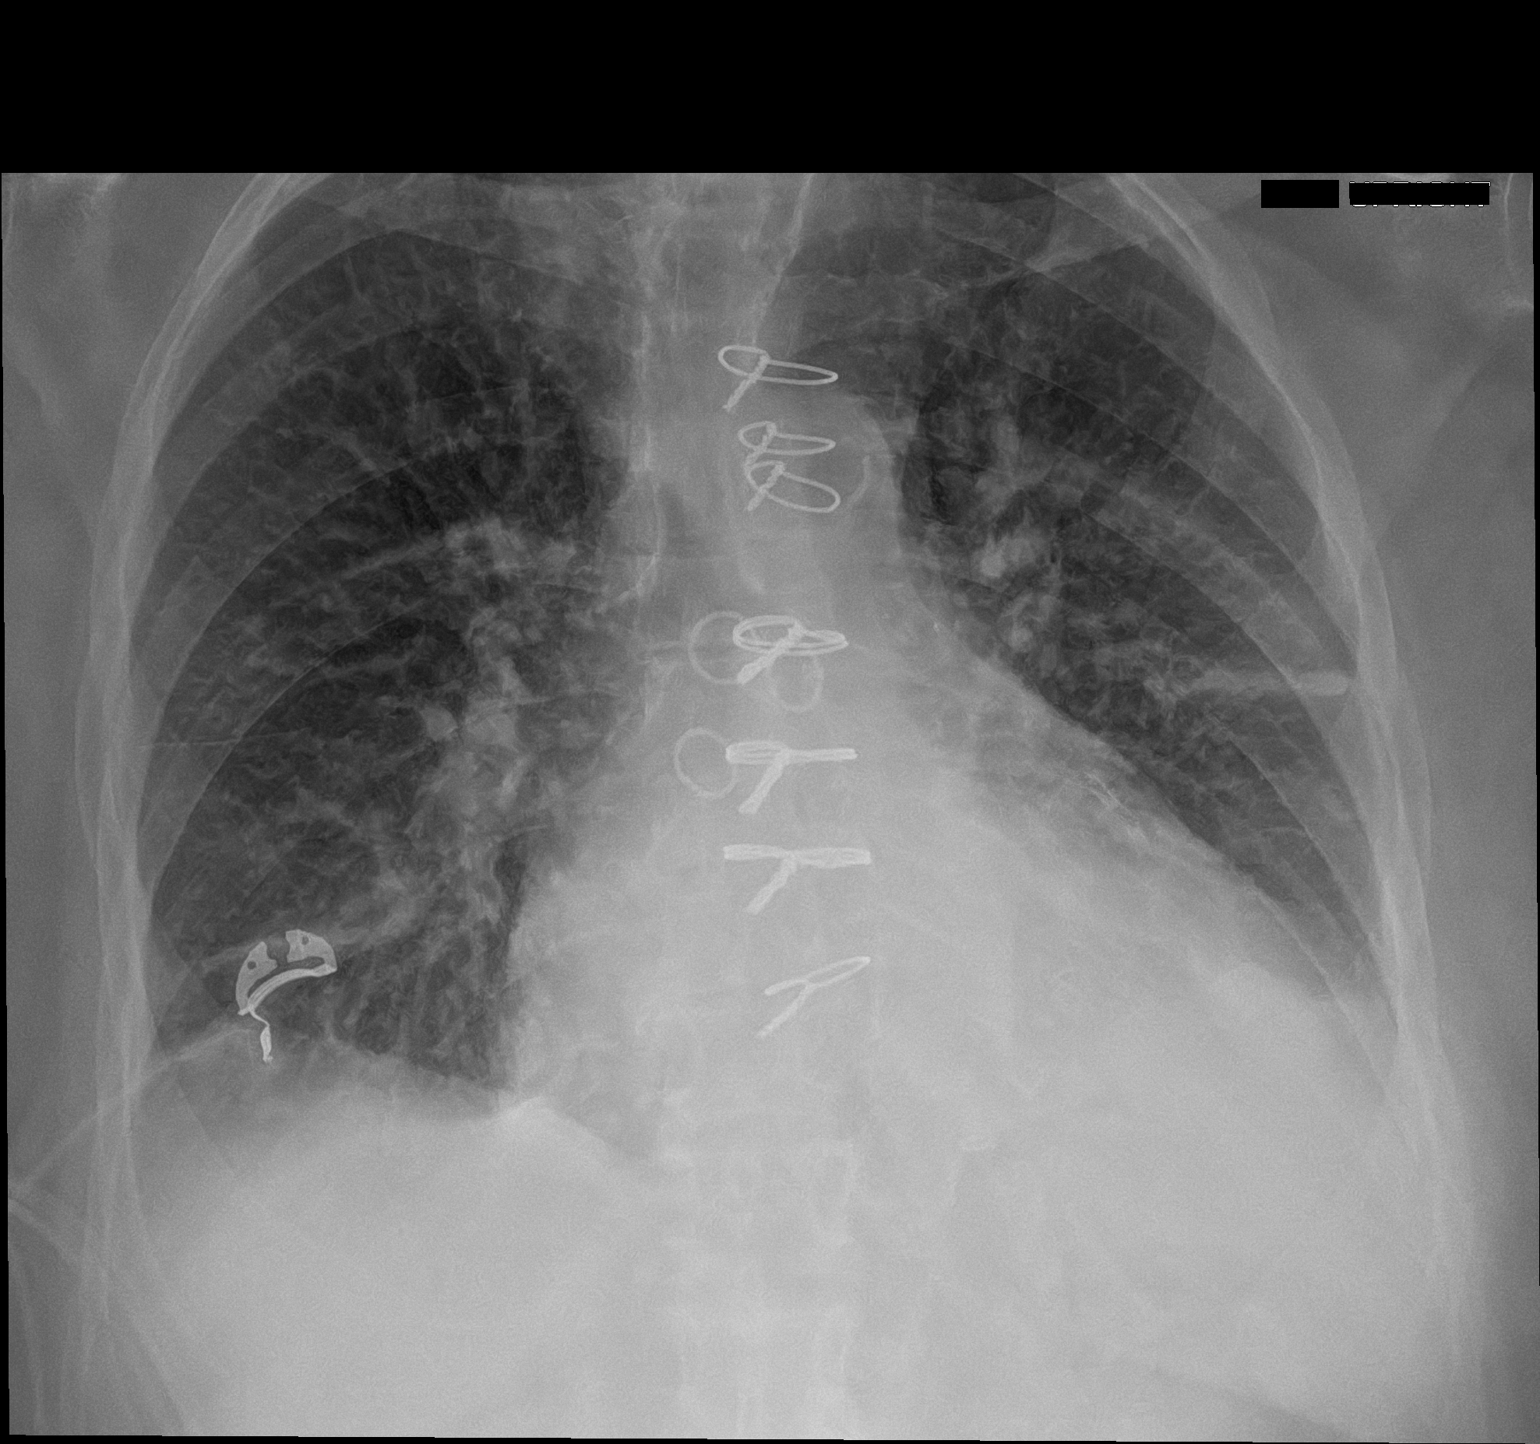

[1 of 1 positions shown; findings below may reference images not displayed]

FINDINGS: Enlargement of cardiac silhouette with vascular congestion post
CABG.

Atherosclerotic calcification aorta.

Chronic accentuation of perihilar interstitial markings, stable.

Subsegmental atelectasis LEFT mid lung.

Small bibasilar effusions and atelectasis.

No pneumothorax or acute osseous findings.
IMPRESSION: Enlargement of cardiac silhouette with pulmonary vascular
congestion.

Chronic accentuation of markings with bibasilar atelectasis and
small effusions.

No pneumothorax.

## 2020-10-05 SURGERY — INSERTION OF DIALYSIS CATHETER
Anesthesia: General

## 2020-10-05 MED ORDER — HEPARIN SODIUM (PORCINE) 1000 UNIT/ML IJ SOLN
INTRAMUSCULAR | Status: AC
  Filled 2020-10-05: qty 4

## 2020-10-05 MED ORDER — CHLORHEXIDINE GLUCONATE CLOTH 2 % EX PADS: 6.0000 | MEDICATED_PAD | Freq: Once | CUTANEOUS | Status: DC

## 2020-10-05 MED ORDER — PREDNISONE 20 MG PO TABS
50.0000 mg | ORAL_TABLET | Freq: Once | ORAL | Status: AC
  Administered 2020-10-05: 50 mg via ORAL
  Filled 2020-10-05: qty 1

## 2020-10-05 MED ORDER — SODIUM CHLORIDE (PF) 0.9 % IJ SOLN
INTRAMUSCULAR | Status: DC | PRN
  Administered 2020-10-05: 500 mL

## 2020-10-05 MED ORDER — PREDNISONE 20 MG PO TABS
50.0000 mg | ORAL_TABLET | Freq: Once | ORAL | Status: AC
  Administered 2020-10-06: 50 mg via ORAL
  Filled 2020-10-05: qty 1

## 2020-10-05 MED ORDER — PROPOFOL 10 MG/ML IV BOLUS
INTRAVENOUS | Status: DC | PRN
  Administered 2020-10-05: 50 mg via INTRAVENOUS

## 2020-10-05 MED ORDER — FENTANYL CITRATE (PF) 100 MCG/2ML IJ SOLN
INTRAMUSCULAR | Status: DC | PRN
  Administered 2020-10-05 (×3): 25 ug via INTRAVENOUS

## 2020-10-05 MED ORDER — PROPOFOL 500 MG/50ML IV EMUL
INTRAVENOUS | Status: DC | PRN
  Administered 2020-10-05: 50 ug/kg/min via INTRAVENOUS

## 2020-10-05 MED ORDER — FENTANYL CITRATE (PF) 100 MCG/2ML IJ SOLN
INTRAMUSCULAR | Status: AC
  Filled 2020-10-05: qty 2

## 2020-10-05 MED ORDER — PROPOFOL 10 MG/ML IV BOLUS
INTRAVENOUS | Status: AC
  Filled 2020-10-05: qty 40

## 2020-10-05 MED ORDER — FENTANYL CITRATE (PF) 100 MCG/2ML IJ SOLN: 25.0000 ug | INTRAMUSCULAR | Status: DC | PRN

## 2020-10-05 MED ORDER — VANCOMYCIN HCL IN DEXTROSE 1-5 GM/200ML-% IV SOLN
1000.0000 mg | INTRAVENOUS | Status: AC
  Administered 2020-10-05: 1000 mg via INTRAVENOUS
  Filled 2020-10-05: qty 200

## 2020-10-05 MED ORDER — VANCOMYCIN HCL IN DEXTROSE 1-5 GM/200ML-% IV SOLN: 1000.0000 mg | INTRAVENOUS | Status: DC

## 2020-10-05 MED ORDER — SODIUM CHLORIDE 0.9 % IV SOLN: INTRAVENOUS | Status: DC

## 2020-10-05 MED ORDER — SODIUM CHLORIDE 0.9 % IV SOLN
INTRAVENOUS | Status: DC | PRN
Start: 2020-10-05 — End: 2020-10-05

## 2020-10-05 MED ORDER — DIPHENHYDRAMINE HCL 50 MG/ML IJ SOLN
50.0000 mg | Freq: Once | INTRAMUSCULAR | Status: AC
  Administered 2020-10-06: 50 mg via INTRAVENOUS
  Filled 2020-10-05: qty 1

## 2020-10-05 MED ORDER — LIDOCAINE HCL (PF) 1 % IJ SOLN
INTRAMUSCULAR | Status: DC | PRN
  Administered 2020-10-05: 1 mL

## 2020-10-05 MED ORDER — LIDOCAINE HCL (PF) 1 % IJ SOLN
INTRAMUSCULAR | Status: AC
  Filled 2020-10-05: qty 30

## 2020-10-05 SURGICAL SUPPLY — 38 items
APPLICATOR CHLORAPREP 10.5 ORG (MISCELLANEOUS) ×2 IMPLANT
BAG DECANTER FOR FLEXI CONT (MISCELLANEOUS) ×2 IMPLANT
CATH PALINDROME-P 19CM W/VT (CATHETERS) ×2 IMPLANT
COVER LIGHT HANDLE STERIS (MISCELLANEOUS) ×4 IMPLANT
COVER PROBE U/S 5X48 (MISCELLANEOUS) ×2 IMPLANT
COVER WAND RF STERILE (DRAPES) ×2 IMPLANT
DECANTER SPIKE VIAL GLASS SM (MISCELLANEOUS) ×4 IMPLANT
DRAPE C-ARM FOLDED MOBILE STRL (DRAPES) ×2 IMPLANT
DRAPE CHEST BREAST 15X10 FENES (DRAPES) ×2 IMPLANT
DRSG SORBAVIEW 3.5X5-5/16 MED (GAUZE/BANDAGES/DRESSINGS) ×2 IMPLANT
ELECT REM PT RETURN 9FT ADLT (ELECTROSURGICAL) ×2
ELECTRODE REM PT RTRN 9FT ADLT (ELECTROSURGICAL) ×1 IMPLANT
GAUZE 4X4 16PLY RFD (DISPOSABLE) ×2 IMPLANT
GEL ULTRASOUND 20GR AQUASONIC (MISCELLANEOUS) ×2 IMPLANT
GLOVE SURG ENC MOIS LTX SZ6.5 (GLOVE) ×2 IMPLANT
GLOVE SURG UNDER POLY LF SZ6.5 (GLOVE) ×2 IMPLANT
GLOVE SURG UNDER POLY LF SZ7 (GLOVE) ×4 IMPLANT
GOWN STRL REUS W/TWL LRG LVL3 (GOWN DISPOSABLE) ×4 IMPLANT
IV CONNECTOR ONE LINK NDLESS (IV SETS) IMPLANT
IV NS 500ML (IV SOLUTION) ×1
IV NS 500ML BAXH (IV SOLUTION) ×1 IMPLANT
KIT BLADEGUARD II DBL (SET/KITS/TRAYS/PACK) ×2 IMPLANT
KIT PALINDROME-P 55CM (CATHETERS) IMPLANT
KIT TURNOVER KIT A (KITS) ×2 IMPLANT
MARKER SKIN DUAL TIP RULER LAB (MISCELLANEOUS) ×2 IMPLANT
NEEDLE HYPO 18GX1.5 BLUNT FILL (NEEDLE) ×2 IMPLANT
NEEDLE HYPO 25X1 1.5 SAFETY (NEEDLE) ×2 IMPLANT
PACK BASIC III (CUSTOM PROCEDURE TRAY) ×1
PACK SRG BSC III STRL LF ECLPS (CUSTOM PROCEDURE TRAY) ×1 IMPLANT
PAD ARMBOARD 7.5X6 YLW CONV (MISCELLANEOUS) ×2 IMPLANT
PENCIL SMOKE EVACUATOR COATED (MISCELLANEOUS) ×2 IMPLANT
SET BASIN LINEN APH (SET/KITS/TRAYS/PACK) ×2 IMPLANT
SUT MNCRL AB 4-0 PS2 18 (SUTURE) IMPLANT
SUT SILK 2 0 FSL 18 (SUTURE) IMPLANT
SUT VIC AB 3-0 SH 27 (SUTURE)
SUT VIC AB 3-0 SH 27X BRD (SUTURE) IMPLANT
SYR 10ML LL (SYRINGE) ×4 IMPLANT
SYR CONTROL 10ML LL (SYRINGE) ×2 IMPLANT

## 2020-10-05 NOTE — Progress Notes (Signed)
Held AM meds because patient was scheduled for HD cath placement at noon and then dialysis. When patient returned she refused her meds d/t dialysis.

## 2020-10-05 NOTE — Procedures (Signed)
   HEMODIALYSIS TREATMENT NOTE:  Uneventful 3 hour heparin-free HD completed via right femoral temporary catheter.  As usual, BP fluctuated widely during treatment.  UF was interrupted and rate was adjusted several times.  Net UF 1.1 liters.  All blood was returned.  No changes from pre-HD assessment.  Rockwell Alexandria, RN

## 2020-10-05 NOTE — Consult Note (Signed)
[place]  Reason for Consult: ESRD, malfunctioning AVF   Chief Complaint    Blood clots      HPI: Shawna [name] is a 80 y.o. female with AVF on the LUE that is not working. She needs a permacath to go home with and has had one in the past. She has one in 2018. She thinks she had one since that time but is unsure. She was recently in the ED and tested positive for COVID when she presented with chest pain. She was actually discharged and says she was never SOB or had any cough. She continues to not have any respiratory symptoms.    She has a Right EJ IV in place as she has limited access. She needs a new Right IJ permacath. She had a temporary Right Femoral line placed and had dialysis this weekend she says.     Past Medical History:  Diagnosis Date  . Acute on chronic respiratory failure with hypoxia ([place]) 10/10/2016  . Anxiety   . Arthritis   . AVM (arteriovenous malformation) of colon   . CAD (coronary artery disease)    a. s/p CABG in 2013 b. DES to D1 in 10/2016. c. cath in 07/2018 showing patent grafts with occlusion of D1 at prior stent site and progression of PDA disease --> medical management recommended  . Carotid artery disease ([place])    a. 97-98% LICA, 03/2118   . Chronic anemia   . Chronic bronchitis ([place])   . Chronic diastolic CHF (congestive heart failure) ([place])    a. 02/2012 Echo EF 60-65%, nl wall motion, Gr 1 DD, mod MR  . Colon cancer ([place]) 1992  . Esophageal stricture   . ESRD on hemodialysis ([place])    ESRD due to HTN, started dialysis 2011 and gets HD [facility] with Dr Hinda Lenis on MWF schedule.  Access is LUA AVF as of Sept 2014.   Marland Kitchen GERD (gastroesophageal reflux disease)   . High cholesterol 12/2011  . History of blood transfusion 07/2011; 12/2011; 01/2012 X 2; 04/2012  . History of gout   . History of lower GI bleeding   . Hypertension   . Iron deficiency anemia   . Mitral regurgitation    a. Moderate by echo, 02/2012  .  Myocardial infarction ([place])   . NSVT (nonsustained ventricular tachycardia) ([place])   . Ovarian cancer ([place]) 1992  . PAF (paroxysmal atrial fibrillation) ([place])   . Pneumonia ~ 2009  . PUD (peptic ulcer disease)   . TIA (transient ischemic attack)     Past Surgical History:  Procedure Laterality Date  . A/V SHUNTOGRAM Left 03/19/2019   Procedure: A/V SHUNTOGRAM;  Surgeon: Katha Cabal, MD;  Location: [place] CV LAB;  Service: Cardiovascular;  Laterality: Left;  . ABDOMINAL HYSTERECTOMY  1992  . APPENDECTOMY  06/1990  . AV FISTULA PLACEMENT  07/2009   left upper arm  . AV FISTULA PLACEMENT Right 09/06/2016   Procedure: RIGHT FOREARM ARTERIOVENOUS (AV) GRAFT;  Surgeon: Elam Dutch, MD;  [facility] OR;  Service: Vascular;  Laterality: Right;  . AV FISTULA PLACEMENT N/A 02/24/2017   Procedure: INSERTION OF ARTERIOVENOUS (AV) GORE-TEX GRAFT ARM (BRACHIAL AXILLARY);  Surgeon: Katha Cabal, MD;  Location: [facility] ORS;  Service: Vascular;  Laterality: N/A;  . [place] Right 09/06/2016   Procedure: REMOVAL OF Right Arm ARTERIOVENOUS GORETEX GRAFT and Vein Patch angioplasty of brachial artery;  Surgeon: Angelia Mould, MD;  Location: [place];  Service: Vascular;  Laterality: Right;  . BIOPSY  09/26/2019   Procedure: BIOPSY;  Surgeon: Rogene Houston, MD;  [facility];  Service: Endoscopy;;  . COLON RESECTION  1992  . COLON SURGERY    . COLONOSCOPY N/A 03/09/2019   Procedure: COLONOSCOPY;  Surgeon: Rogene Houston, MD;  [facility];  Service: Endoscopy;  Laterality: N/A;  . CORONARY ANGIOPLASTY WITH STENT PLACEMENT  12/15/11   "2"  . CORONARY ANGIOPLASTY WITH STENT PLACEMENT  y/2013   "1; makes total of 3" (05/02/2012)  . CORONARY ARTERY BYPASS GRAFT  06/13/2012   Procedure: CORONARY ARTERY BYPASS GRAFTING (CABG);  Surgeon: Grace Isaac, MD;  Location: [place];  Service: Open Heart Surgery;  Laterality: N/A;  cabg x four;  using left internal  mammary artery, and left leg greater saphenous vein harvested endoscopically  . CORONARY STENT INTERVENTION N/A 10/13/2016   Procedure: Coronary Stent Intervention;  Surgeon: Troy Sine, MD;  Location: [place] CV LAB;  Service: Cardiovascular;  Laterality: N/A;  . DIALYSIS/PERMA CATHETER REMOVAL N/A 04/18/2017   Procedure: DIALYSIS/PERMA CATHETER REMOVAL;  Surgeon: Katha Cabal, MD;  Location: [place] CV LAB;  Service: Cardiovascular;  Laterality: N/A;  . DILATION AND CURETTAGE OF UTERUS    . ESOPHAGOGASTRODUODENOSCOPY  01/20/2012   Procedure: ESOPHAGOGASTRODUODENOSCOPY (EGD);  Surgeon: Ladene Artist, MD,FACG;  Location: [facility] ENDOSCOPY;  Service: Endoscopy;  Laterality: N/A;  . ESOPHAGOGASTRODUODENOSCOPY N/A 03/26/2013   Procedure: ESOPHAGOGASTRODUODENOSCOPY (EGD);  Surgeon: Irene Shipper, MD;  Location[facility] ENDOSCOPY;  Service: Endoscopy;  Laterality: N/A;  . ESOPHAGOGASTRODUODENOSCOPY N/A 04/30/2015   Procedure: ESOPHAGOGASTRODUODENOSCOPY (EGD);  Surgeon: Rogene Houston, MD;  [facility];  Service: Endoscopy;  Laterality: N/A;  1pm - moved to 10/20 @ 1:10  . ESOPHAGOGASTRODUODENOSCOPY N/A 07/29/2016   Procedure: ESOPHAGOGASTRODUODENOSCOPY (EGD);  Surgeon: Manus Gunning, MD;  Location: [place];  Service: Gastroenterology;  Laterality: N/A;  enteroscopy  . ESOPHAGOGASTRODUODENOSCOPY N/A 09/26/2019   Procedure: ESOPHAGOGASTRODUODENOSCOPY (EGD);  Surgeon: Rogene Houston, MD;  [facility];  Service: Endoscopy;  Laterality: N/A;  1250  . GIVENS CAPSULE STUDY N/A 03/07/2019   Procedure: GIVENS CAPSULE STUDY;  Surgeon: Rogene Houston, MD;  [facility];  Service: Endoscopy;  Laterality: N/A;  7:30  . INTRAOPERATIVE TRANSESOPHAGEAL ECHOCARDIOGRAM  06/13/2012   Procedure: INTRAOPERATIVE TRANSESOPHAGEAL ECHOCARDIOGRAM;  Surgeon: Grace Isaac, MD;  Location: [place];  Service: Open Heart Surgery;  Laterality: N/A;  . IR FLUORO GUIDE CV LINE  RIGHT  06/17/2020  . IR GENERIC HISTORICAL  07/26/2016   IR FLUORO GUIDE CV LINE RIGHT 07/26/2016 Greggory Keen, MD [facility]  . IR GENERIC HISTORICAL  07/26/2016   IR US GUIDE VASC ACCESS RIGHT 07/26/2016 Greggory Keen, MD [facility]  . IR GENERIC HISTORICAL  08/02/2016   IR US GUIDE VASC ACCESS RIGHT 08/02/2016 Greggory Keen, MD [facility]  . IR GENERIC HISTORICAL  08/02/2016   IR FLUORO GUIDE CV LINE RIGHT 08/02/2016 Greggory Keen, MD [facility]  . IR RADIOLOGY PERIPHERAL GUIDED IV START  03/28/2017  . IR REMOVAL TUN CV CATH W/O FL  08/11/2020  . IR THROMBECTOMY AV FISTULA W/THROMBOLYSIS INC/SHUNT/IMG LEFT Left 06/17/2020  . IR US GUIDE VASC ACCESS LEFT  06/17/2020  . IR US GUIDE VASC ACCESS RIGHT  03/28/2017  . IR US GUIDE VASC ACCESS RIGHT  06/17/2020  . LEFT HEART CATH AND CORONARY ANGIOGRAPHY N/A 09/20/2016   Procedure: Left Heart Cath and  Coronary Angiography;  Surgeon: [name], MD;  Location: [place] [facility] LAB;  Service: Cardiovascular;  Laterality: N/A;  . LEFT HEART CATH AND CORS/GRAFTS ANGIOGRAPHY N/A 10/13/2016   Procedure: Left Heart Cath and Cors/Grafts Angiography;  Surgeon: Troy Sine, MD;  Location: [place] CV LAB;  Service: Cardiovascular;  Laterality: N/A;  . LEFT HEART CATH AND CORS/GRAFTS ANGIOGRAPHY N/A 07/13/2018   Procedure: LEFT HEART CATH AND CORS/GRAFTS ANGIOGRAPHY;  Surgeon: Martinique, Peter M, MD;  Location: [place] CV LAB;  Service: Cardiovascular;  Laterality: N/A;  . LEFT HEART CATHETERIZATION WITH CORONARY ANGIOGRAM N/A 12/15/2011   Procedure: LEFT HEART CATHETERIZATION WITH CORONARY ANGIOGRAM;  Surgeon: Burnell Blanks, MD;  Location: [facility] CATH LAB;  Service: Cardiovascular;  Laterality: N/A;  . LEFT HEART CATHETERIZATION WITH CORONARY ANGIOGRAM N/A 01/10/2012   Procedure: LEFT HEART CATHETERIZATION WITH CORONARY ANGIOGRAM;  Surgeon: Peter M Martinique, MD;  Location: [facility] CATH LAB;  Service: Cardiovascular;  Laterality: N/A;  . LEFT HEART  CATHETERIZATION WITH CORONARY ANGIOGRAM N/A 06/08/2012   Procedure: LEFT HEART CATHETERIZATION WITH CORONARY ANGIOGRAM;  Surgeon: Burnell Blanks, MD;  Location: [facility] CATH LAB;  Service: Cardiovascular;  Laterality: N/A;  . LEFT HEART CATHETERIZATION WITH CORONARY/GRAFT ANGIOGRAM N/A 12/10/2013   Procedure: LEFT HEART CATHETERIZATION WITH Beatrix Fetters;  Surgeon: [name], MD;  Location: [facility] CATH LAB;  Service: Cardiovascular;  Laterality: N/A;  . OVARY SURGERY     ovarian cancer  . POLYPECTOMY  03/09/2019   Procedure: POLYPECTOMY;  Surgeon: Rogene Houston, MD;  [facility];  Service: Endoscopy;;  cecal   . POLYPECTOMY N/A 09/26/2019   Procedure: DUODENAL POLYPECTOMY;  Surgeon: Rogene Houston, MD;  [facility];  Service: Endoscopy;  Laterality: N/A;  . REVISION OF ARTERIOVENOUS GORETEX GRAFT N/A 02/24/2017   Procedure: REVISION OF ARTERIOVENOUS GORETEX GRAFT (RESECTION);  Surgeon: Katha Cabal, MD;  Location: [facility] ORS;  Service: Vascular;  Laterality: N/A;  . REVISON OF ARTERIOVENOUS FISTULA Left 06/19/2020   Procedure: REVISION OF LEFT UPPER ARM AV GRAFT WITH INTERPOSITION JUMP GRAFT USING 6MM GORE LIMB;  Surgeon: Marty Heck, MD;  Location: [place];  Service: Vascular;  Laterality: Left;  . SHUNTOGRAM N/A 10/15/2013   Procedure: Fistulogram;  Surgeon: Serafina Mitchell, MD;  Location: [facility] CATH LAB;  Service: Cardiovascular;  Laterality: N/A;  . THROMBECTOMY / ARTERIOVENOUS GRAFT REVISION  2011   left upper arm  . TUBAL LIGATION  1980's  . UPPER EXTREMITY ANGIOGRAPHY Bilateral 12/06/2016   Procedure: Upper Extremity Angiography;  Surgeon: Katha Cabal, MD;  Location: [place] CV LAB;  Service: Cardiovascular;  Laterality: Bilateral;  . UPPER EXTREMITY INTERVENTION Left 06/06/2017   Procedure: UPPER EXTREMITY INTERVENTION;  Surgeon: Katha Cabal, MD;  Location: [place] CV LAB;  Service: Cardiovascular;  Laterality:  Left;    Family History  Problem Relation Age of Onset  . Heart disease Mother        Heart Disease before age 49  . Hyperlipidemia Mother   . Hypertension Mother   . Diabetes Mother   . Heart attack Mother   . Heart disease Father        Heart Disease before age 73  . Hyperlipidemia Father   . Hypertension Father   . Diabetes Father   . Diabetes Sister   . Hypertension Sister   . Diabetes Brother   . Hyperlipidemia Brother   . Heart attack Brother   . Hypertension Sister   .  Heart attack Brother   . Colon cancer Child 69  . Other Other        noncontributory for early CAD  . Esophageal cancer Neg Hx   . Liver disease Neg Hx   . Kidney disease Neg Hx   . Colon polyps Neg Hx     Social History   Tobacco Use  . Smoking status: Never Smoker  . Smokeless tobacco: Never Used  Vaping Use  . Vaping Use: Never used  Substance Use Topics  . Alcohol use: No    Alcohol/week: 0.0 standard drinks  . Drug use: No    Medications:  I have reviewed the patient's current medications. Prior to Admission:  Medications Prior to Admission  Medication Sig Dispense Refill Last Dose  . amiodarone (PACERONE) 200 MG tablet Take 1 tablet (200 mg total) by mouth daily. Starting July 2 90 tablet 3 10/03/2020 at Unknown time  . aspirin EC 81 MG tablet Take 81 mg by mouth daily.    10/03/2020 at Unknown time  . fluticasone (FLONASE) 50 MCG/ACT nasal spray Place 1 spray into both nostrils at bedtime as needed for allergies.    10/03/2020 at Unknown time  . isosorbide mononitrate (IMDUR) 30 MG 24 hr tablet Take 3 tablets (90 mg total) by mouth 2 (two) times daily. 30 tablet 2 10/03/2020 at Unknown time  . lidocaine-prilocaine (EMLA) cream Apply 1 application topically every Monday, Wednesday, and Friday. Prior to dialysis   10/02/2020 at Unknown time  . loratadine (CLARITIN) 10 MG tablet Take 1 tablet (10 mg total) by mouth daily as needed for allergies. 30 tablet 0 Past Week at Unknown time  .  multivitamin (RENA-VIT) TABS tablet Take 1 tablet by mouth daily.   10/03/2020 at Unknown time  . nitroGLYCERIN (NITROSTAT) 0.4 MG SL tablet Place 1 tablet (0.4 mg total) under the tongue every 5 (five) minutes x 3 doses as needed for chest pain (if no relief after 2nd dose, proceed to the ED for an evaluation or call 911). 25 tablet 2 PRN  . omeprazole (PRILOSEC) 40 MG capsule Take 1 capsule (40 mg total) by mouth daily before breakfast. 30 capsule 5 10/03/2020 at Unknown time  . sevelamer carbonate (RENVELA) 800 MG tablet Take 2 tablets (1,600 mg total) by mouth 3 (three) times daily with meals. 180 tablet 1 10/03/2020 at Unknown time  . simvastatin (ZOCOR) 20 MG tablet Take 1 tablet (20 mg total) by mouth at bedtime. 90 tablet 3 10/02/2020 at Unknown time   Scheduled: . amiodarone  200 mg Oral Daily  . aspirin EC  81 mg Oral Daily  . Chlorhexidine Gluconate Cloth  6 each Topical Q0600  . isosorbide mononitrate  90 mg Oral BID  . mouth rinse  15 mL Mouth Rinse BID  . sevelamer carbonate  1,600 mg Oral TID WC  . simvastatin  20 mg Oral QHS   Continuous:  ZCH:[id number], loratadine, ondansetron **OR** ondansetron (ZOFRAN) IV  Allergies  Allergen Reactions  . Aspirin Other (See Comments)    High Doses Mess up her stomach; "makes my bowels have blood in them". Takes 81 mg EC Aspirin   . Penicillins Other (See Comments)    SYNCOPE? , "makes me real weak when I take it; like I'll pass out"  Has patient had a PCN reaction causing immediate rash, facial/tongue/throat swelling, SOB or lightheadedness with hypotension: Yes Has patient had a PCN reaction causing severe rash involving mucus membranes or skin necrosis: no Has patient had  a PCN reaction that required hospitalization no Has patient had a PCN reaction occurring within the last 10 years: no If all of the above  . Amlodipine Swelling  . Bactrim [Sulfamethoxazole-Trimethoprim] Rash  . Contrast Media [Iodinated Diagnostic Agents]  Itching  . Iron Itching and Other (See Comments)    "they gave me iron in dialysis; had to give me Benadryl cause I had to have the iron" (05/02/2012)  . Nitrofurantoin Hives  . Tylenol [Acetaminophen] Itching and Other (See Comments)    Makes her feet on fire per pt  . Gabapentin Other (See Comments)    Unknown reaction  . Hydralazine Itching  . Ranexa [Ranolazine] Other (See Comments)    Myoclonus-hospitalized   . Dexilant [Dexlansoprazole] Other (See Comments)    Upset stomach  . Levaquin [Levofloxacin In D5w] Rash  . Morphine And Related Itching and Other (See Comments)    Itching in feet  . Plavix [Clopidogrel Bisulfate] Rash  . Protonix [Pantoprazole Sodium] Rash  . Venofer [Ferric Oxide] Itching and Other (See Comments)    Patient reports using Benadryl prior to doses as [place]:  A comprehensive review of systems was negative except for: Musculoskeletal: positive for LUE AVF not functioning  Blood pressure (!) 142/40, pulse 69, temperature 98.8 F (37.1 C), temperature source Oral, resp. rate 18, height 5\' 1"  (1.549 m), weight 63.3 kg, SpO2 93 %. Physical Exam Vitals reviewed.  Constitutional:      Appearance: Normal appearance.  HENT:     Head: Normocephalic.     Nose: Nose normal.  Eyes:     Extraocular Movements: Extraocular movements intact.  Cardiovascular:     Rate and Rhythm: Normal rate.  Pulmonary:     Effort: Pulmonary effort is normal.  Abdominal:     General: There is no distension.     Palpations: Abdomen is soft.     Tenderness: There is no abdominal tenderness.  Musculoskeletal:     Cervical back: No rigidity.     Comments: Moves all extremities  Skin:    General: Skin is warm.  Neurological:     General: No focal deficit present.     Mental Status: She is alert and oriented to person, place, and time.  Psychiatric:        Mood and Affect: Mood normal.        Behavior: Behavior normal.        Thought Content: Thought  content normal.        Judgment: Judgment normal.     Results: Results for orders placed or performed during the hospital encounter of 10/03/20 (from the past 48 hour(s))  Basic metabolic panel     Status: Abnormal   Collection Time: 10/03/20 12:25 PM  Result Value Ref Range   Sodium 144 135 - 145 mmol/L   Potassium 4.5 3.5 - 5.1 mmol/L   Chloride 101 98 - 111 mmol/L   CO2 27 22 - 32 mmol/L   Glucose, Bld 96 70 - 99 mg/dL    Comment: Glucose reference range applies only to samples taken after fasting for at least 8 hours.   BUN 56 (H) 8 - 23 mg/dL   Creatinine, Ser 10.03 (H) 0.44 - 1.00 mg/dL   Calcium 10.6 (H) 8.9 - 10.3 mg/dL   GFR, Estimated 4 (L) >60 mL/min    Comment: (NOTE) Calculated using the CKD-EPI Creatinine Equation (2021)    Anion gap 16 (H) 5 - 15  Comment: Performed at [facility], [address], [place], [place] [address]  CBC with Differential     Status: Abnormal   Collection Time: 10/03/20 12:25 PM  Result Value Ref Range   WBC 7.4 4.0 - 10.5 K/uL   RBC 2.82 (L) 3.87 - 5.11 MIL/uL   Hemoglobin 10.2 (L) 12.0 - 15.0 g/dL   HCT 34.8 (L) 36.0 - 46.0 %   MCV 123.4 (H) 80.0 - 100.0 fL   MCH 36.2 (H) 26.0 - 34.0 pg   MCHC 29.3 (L) 30.0 - 36.0 g/dL   RDW 19.1 (H) 11.5 - 15.5 %   Platelets 254 150 - 400 K/uL   nRBC 0.0 0.0 - 0.2 %   Neutrophils Relative % 66 %   Neutro Abs 4.8 1.7 - 7.7 K/uL   Lymphocytes Relative 10 %   Lymphs Abs 0.7 0.7 - 4.0 K/uL   Monocytes Relative 13 %   Monocytes Absolute 0.9 0.1 - 1.0 K/uL   Eosinophils Relative 10 %   Eosinophils Absolute 0.8 (H) 0.0 - 0.5 K/uL   Basophils Relative 1 %   Basophils Absolute 0.1 0.0 - 0.1 K/uL   Immature Granulocytes 0 %   Abs Immature Granulocytes 0.02 0.00 - 0.07 K/uL   Polychromasia PRESENT     Comment: Performed at [facility], [address], [place], [place] [address]  Resp Panel by RT-PCR (Flu A&B, Covid) Nasopharyngeal Swab     Status: None   Collection Time: 10/03/20  6:05 PM    Specimen: Nasopharyngeal Swab; Nasopharyngeal(NP) swabs in vial transport medium  Result Value Ref Range   SARS Coronavirus 2 by RT PCR NEGATIVE NEGATIVE    Comment: (NOTE) SARS-CoV-2 target nucleic acids are NOT DETECTED.  The SARS-CoV-2 RNA is generally detectable in upper respiratory specimens during the acute phase of infection. The lowest concentration of SARS-CoV-2 viral copies this assay can detect is 138 copies/mL. A negative result does not preclude SARS-Cov-2 infection and should not be used as the sole basis for treatment or other patient management decisions. A negative result may occur with  improper specimen collection/handling, submission of specimen other than nasopharyngeal swab, presence of viral mutation(s) within the areas targeted by this assay, and inadequate number of viral copies(<138 copies/mL). A negative result must be combined with clinical observations, patient history, and epidemiological information. The expected result is Negative.  Fact Sheet for Patients:  EntrepreneurPulse.com.au  Fact Sheet for Healthcare Providers:  IncredibleEmployment.be  This test is no t yet approved or cleared by the [place] FDA and  has been authorized for detection and/or diagnosis of SARS-CoV-2 by FDA under an Emergency Use Authorization (EUA). This EUA will remain  in effect (meaning this test can be used) for the duration of the COVID-19 declaration under Section 564(b)(1) of the Act, 21 U.S.C.section 360bbb-3(b)(1), unless the authorization is terminated  or revoked sooner.       Influenza A by PCR NEGATIVE NEGATIVE   Influenza B by PCR NEGATIVE NEGATIVE    Comment: (NOTE) The Xpert Xpress SARS-CoV-2/FLU/RSV plus assay is intended as an aid in the diagnosis of influenza from Nasopharyngeal swab specimens and should not be used as a sole basis for treatment. Nasal washings and aspirates are unacceptable for Xpert Xpress  SARS-CoV-2/FLU/RSV testing.  Fact Sheet for Patients: EntrepreneurPulse.com.au  Fact Sheet for Healthcare Providers: IncredibleEmployment.be  This test is not yet approved or cleared by the [place] FDA and has been authorized for detection and/or diagnosis of SARS-CoV-2 by FDA  under an Emergency Use Authorization (EUA). This EUA will remain in effect (meaning this test can be used) for the duration of the COVID-19 declaration under Section 564(b)(1) of the Act, 21 U.S.C. section 360bbb-3(b)(1), unless the authorization is terminated or revoked.  Performed at [facility], [address], [place], [place] [address]   MRSA PCR Screening     Status: None   Collection Time: 10/03/20 11:00 PM   Specimen: Nasopharyngeal  Result Value Ref Range   MRSA by PCR NEGATIVE NEGATIVE    Comment:        The GeneXpert MRSA Assay (FDA approved for NASAL specimens only), is one component of a comprehensive MRSA colonization surveillance program. It is not intended to diagnose MRSA infection nor to guide or monitor treatment for MRSA infections. Performed at [facility], [address] [place], [place] [address]   Basic metabolic panel     Status: Abnormal   Collection Time: 10/04/20  4:26 AM  Result Value Ref Range   Sodium 139 135 - 145 mmol/L   Potassium 4.8 3.5 - 5.1 mmol/L   Chloride 101 98 - 111 mmol/L   CO2 26 22 - 32 mmol/L   Glucose, Bld 82 70 - 99 mg/dL    Comment: Glucose reference range applies only to samples taken after fasting for at least 8 hours.   BUN 35 (H) 8 - 23 mg/dL   Creatinine, Ser 7.48 (H) 0.44 - 1.00 mg/dL   Calcium 9.6 8.9 - 10.3 mg/dL   GFR, Estimated 5 (L) >60 mL/min    Comment: (NOTE) Calculated using the CKD-EPI Creatinine Equation (2021)    Anion gap 12 5 - 15    Comment: Performed at [facility], [address], [place], [place] [address]   *Note: Due to a large number of results and/or encounters for the  requested time period, some results have not been displayed. A complete set of results can be found in Results Review.    DG Chest Portable 1 View  Result Date: 10/03/2020 CLINICAL DATA:  Shortness of breath for several hours, has not had dialysis treatment. History CHF, hypertension, chronic renal insufficiency, coronary artery disease post MI EXAM: PORTABLE CHEST 1 VIEW COMPARISON:  Portable exam 1455 hours compared to 09/29/2020 FINDINGS: Enlargement of cardiac silhouette post CABG and coronary stenting. Mild pulmonary vascular congestion. Mediastinal contours normal with atherosclerotic calcification aorta noted. Minimal interstitial prominence bilaterally, likely reflecting minimal failure. Subsegmental atelectasis bibasilar greater on LEFT. No pleural effusion or pneumothorax. Bones demineralized. IMPRESSION: Enlargement of cardiac silhouette with pulmonary vascular congestion and question mild pulmonary edema/failure. Post CABG and coronary stenting. Scattered atelectasis greater at LEFT base. Aortic Atherosclerosis (ICD10-I70.0). Electronically Signed   By: Lavonia Dana M.D.   On: 10/03/2020 15:03     Assessment & Plan:  EMERA BUSSIE is a 80 y.o. female with ESRD and malfunctioning AVF. She needs a permacath. She will need another IV access for her anesthesia given the location.  Discussed the tunneled catheter with the patient and risk of bleeding, infection, injury to vessels, pneumothorax. Discussed needs for another IV with RNs, may need to get someone to do an US guided IV Discussed recent COVID positive and negative test with OR and they will determine how they need to handle her case   Will plan to get femoral line out if we get the R IJ line in place.  All questions were answered to the satisfaction of the patient.    Virl Cagey  10/05/2020, 11:36 AM

## 2020-10-05 NOTE — Transfer of Care (Signed)
Immediate Anesthesia Transfer of Care Note  Patient: Shawna [name]  Procedure(s) Performed: INSERTION OF TUNNELED DIALYSIS CATHETER (N/A )  Patient Location: PACU  Anesthesia Type:General  Level of Consciousness: awake and alert   Airway & Oxygen Therapy: Patient Spontanous Breathing and Patient connected to nasal cannula oxygen  Post-op Assessment: Report given to RN and Post -op Vital signs reviewed and stable  Post vital signs: Reviewed and stable  Last Vitals:  Vitals Value Taken Time  BP 125/36   Temp 97.5   Pulse 79   Resp    SpO2 100%     Last Pain:  Vitals:   10/05/20 1350  TempSrc: Oral  PainSc:          Complications: No complications documented.

## 2020-10-05 NOTE — Anesthesia Postprocedure Evaluation (Signed)
Anesthesia Post Note  Patient: Shawna [name]  Procedure(s) Performed: ABORTED TUNNELED DIALYSIS CATHETER PLACEMENT RIGHT INTERNAL JUGULAR VEIN  (N/A )  Patient location during evaluation: PACU Anesthesia Type: General Level of consciousness: awake and alert, oriented and sedated Pain management: pain level controlled Vital Signs Assessment: post-procedure vital signs reviewed and stable Respiratory status: spontaneous breathing and respiratory function stable Cardiovascular status: blood pressure returned to baseline Postop Assessment: no apparent nausea or vomiting Anesthetic complications: no   No complications documented.   Last Vitals:  Vitals:   10/05/20 1513 10/05/20 1515  BP: (!) 125/36 (!) 139/42  Pulse:    Resp: 14   Temp: (!) 36.4 C   SpO2: 100% 100%    Last Pain:  Vitals:   10/05/20 1513  TempSrc:   PainSc: 0-No pain                 Leonia Heatherly C Laurette Villescas

## 2020-10-05 NOTE — Progress Notes (Signed)
[facility]  The right internal jugular is occluded at least partially and I was unable to access it. She will need transfer for further options. Femoral catheter remains in place from the ED insertion.   Notified team. Post attempt CXR ordered.  Curlene Labrum, MD [facility] [address] [place], [place] [address] [phone] (office)

## 2020-10-05 NOTE — Op Note (Addendum)
Operative Note 10/05/20   Preoperative Diagnosis: End Stage Renal Disease, Malfunctioning Arteriovenous Fistula     Postoperative Diagnosis: End Stage Renal Disease, Malfunctioning Arteriovenous Fistula, Occluded right internal jugular vein    Procedure(s) Performed: Aborted Tunneled Dialysis Catheter Placement, Right internal jugular vein   Surgeon: Lanell Matar. Constance Haw, MD   Assistants: No qualified resident was available   Anesthesia: Monitored anesthesia care   Anesthesiologist: Denese Killings, MD    Specimens: None   Estimated Blood Loss: Minimal   Fluoroscopy time: None    Blood Replacement: None    Complications: None    Operative Findings: Occluded Right Internal Jugular Vein   Indications:  Shawna [name] is a 80 yo with ESRD and a malfunctioning AVF. She had a femoral line placed in the ED over the weekend and plans for a tunneled line today. She was COVID positive last week and then negative on repeat, but ID recommended continuing her COVID protocols per guidelines from [facility].  We discussed tunneled access and risk of bleeding, infection, injury to vessels, pneumothorax.   Procedure: The patient was brought into the operating room and monitored anesthesia was induced.   The right chest and neck was prepped and draped in the usual sterile fashion.  Preoperative antibiotics were given.   The patient actually had 3-4 scars in the right upper chest which likely indicates more upper access than she previously indicated.  Due to her right external jugular intravenous access prior to the OR, I was not able to see her internal jugular by ultrasound until she was back in the OR and the external jugular intravenous catheter was removed and a second intravenous catheter had been placed.   An Ultrasound was used to look at the right neck and the right internal jugular vein was occluded by at least 80%.  I tried to access it in the more distal part of the internal jugular without success  after using one percent lidocaine was for local anesthesia.  Given the occlusion, I aborted the procedure and did not attempt a left sided catheter due to the resources at [facility].    The patient tolerated the procedure well, and was transported back to her room.  A chest x-ray will be performed at that time to ensure no changes. She will keep her femoral line placed by the ED until she can get further access.   Curlene Labrum, MD [facility] [address] [place], [place] [address] [phone] (office)

## 2020-10-05 NOTE — Consult Note (Signed)
[facility] Renal Consultation Note    Indication for Consultation:  Management of ESRD/hemodialysis; anemia, hypertension/volume and secondary hyperparathyroidism  HPI: Shawna [name] is a 80 y.o. female with PMH significant for HTN, CAD, P. Atrial fibrilation, h/o TIA, recent covid-19 infection and ESRD on HD MWF at [facility] presented to [facility] ED after having difficulties cannulating her AVF on Saturday.  Per reports she developed clots in her line and was rinsed back and told to go to the ED.  HD was attempted on 10/03/20 but unable to run so temp HD catheter was placed in her right femoral vein and she tolerated HD without issue.  She had a vagal episode and was transferred to ICU for a night but is now on regular medicine floor.  She is due to have [facility] placement today by Dr. Constance Haw.  We were asked to provide dialysis during her hospitalization.   Past Medical History:  Diagnosis Date  . Acute on chronic respiratory failure with hypoxia ([place]) 10/10/2016  . Anxiety   . Arthritis   . AVM (arteriovenous malformation) of colon   . CAD (coronary artery disease)    a. s/p CABG in 2013 b. DES to D1 in 10/2016. c. cath in 07/2018 showing patent grafts with occlusion of D1 at prior stent site and progression of PDA disease --> medical management recommended  . Carotid artery disease ([place])    a. 98-11% LICA, 03/1477   . Chronic anemia   . Chronic bronchitis ([place])   . Chronic diastolic CHF (congestive heart failure) ([place])    a. 02/2012 Echo EF 60-65%, nl wall motion, Gr 1 DD, mod MR  . Colon cancer ([place]) 1992  . Esophageal stricture   . ESRD on hemodialysis ([place])    ESRD due to HTN, started dialysis 2011 and gets HD at [facility] with Dr Hinda Lenis on MWF schedule.  Access is LUA AVF as of Sept 2014.   Marland Kitchen GERD (gastroesophageal reflux disease)   . High cholesterol 12/2011  . History of blood transfusion 07/2011; 12/2011; 01/2012 X 2; 04/2012  . History of gout   . History of lower GI  bleeding   . Hypertension   . Iron deficiency anemia   . Mitral regurgitation    a. Moderate by echo, 02/2012  . Myocardial infarction ([place])   . NSVT (nonsustained ventricular tachycardia) ([place])   . Ovarian cancer ([place]) 1992  . PAF (paroxysmal atrial fibrillation) ([place])   . Pneumonia ~ 2009  . PUD (peptic ulcer disease)   . TIA (transient ischemic attack)    Past Surgical History:  Procedure Laterality Date  . A/V SHUNTOGRAM Left 03/19/2019   Procedure: A/V SHUNTOGRAM;  Surgeon: Katha Cabal, MD;  Location: [place] CV LAB;  Service: Cardiovascular;  Laterality: Left;  . ABDOMINAL HYSTERECTOMY  1992  . APPENDECTOMY  06/1990  . AV FISTULA PLACEMENT  07/2009   left upper arm  . AV FISTULA PLACEMENT Right 09/06/2016   Procedure: RIGHT FOREARM ARTERIOVENOUS (AV) GRAFT;  Surgeon: Elam Dutch, MD;  Location: [facility] OR;  Service: Vascular;  Laterality: Right;  . AV FISTULA PLACEMENT N/A 02/24/2017   Procedure: INSERTION OF ARTERIOVENOUS (AV) GORE-TEX GRAFT ARM (BRACHIAL AXILLARY);  Surgeon: Katha Cabal, MD;  Location: [facility] ORS;  Service: Vascular;  Laterality: N/A;  . [place] Right 09/06/2016   Procedure: REMOVAL OF Right Arm ARTERIOVENOUS GORETEX GRAFT and Vein Patch angioplasty of brachial artery;  Surgeon: Angelia Mould, MD;  Location: [facility]  OR;  Service: Vascular;  Laterality: Right;  . BIOPSY  09/26/2019   Procedure: BIOPSY;  Surgeon: Rogene Houston, MD;  [facility];  Service: Endoscopy;;  . COLON RESECTION  1992  . COLON SURGERY    . COLONOSCOPY N/A 03/09/2019   Procedure: COLONOSCOPY;  Surgeon: Rogene Houston, MD;  [facility];  Service: Endoscopy;  Laterality: N/A;  . CORONARY ANGIOPLASTY WITH STENT PLACEMENT  12/15/11   "2"  . CORONARY ANGIOPLASTY WITH STENT PLACEMENT  y/2013   "1; makes total of 3" (05/02/2012)  . CORONARY ARTERY BYPASS GRAFT  06/13/2012   Procedure: CORONARY ARTERY BYPASS GRAFTING (CABG);  Surgeon: Grace Isaac, MD;  Location: [place];  Service: Open Heart Surgery;  Laterality: N/A;  cabg x four;  using left internal mammary artery, and left leg greater saphenous vein harvested endoscopically  . CORONARY STENT INTERVENTION N/A 10/13/2016   Procedure: Coronary Stent Intervention;  Surgeon: Troy Sine, MD;  Location: [place] CV LAB;  Service: Cardiovascular;  Laterality: N/A;  . DIALYSIS/PERMA CATHETER REMOVAL N/A 04/18/2017   Procedure: DIALYSIS/PERMA CATHETER REMOVAL;  Surgeon: Katha Cabal, MD;  Location: [place] CV LAB;  Service: Cardiovascular;  Laterality: N/A;  . DILATION AND CURETTAGE OF UTERUS    . ESOPHAGOGASTRODUODENOSCOPY  01/20/2012   Procedure: ESOPHAGOGASTRODUODENOSCOPY (EGD);  Surgeon: Ladene Artist, MD,FACG;  Location: [facility] ENDOSCOPY;  Service: Endoscopy;  Laterality: N/A;  . ESOPHAGOGASTRODUODENOSCOPY N/A 03/26/2013   Procedure: ESOPHAGOGASTRODUODENOSCOPY (EGD);  Surgeon: Irene Shipper, MD;  Location: [facility] ENDOSCOPY;  Service: Endoscopy;  Laterality: N/A;  . ESOPHAGOGASTRODUODENOSCOPY N/A 04/30/2015   Procedure: ESOPHAGOGASTRODUODENOSCOPY (EGD);  Surgeon: Rogene Houston, MD;  [facility];  Service: Endoscopy;  Laterality: N/A;  1pm - moved to 10/20 @ 1:10  . ESOPHAGOGASTRODUODENOSCOPY N/A 07/29/2016   Procedure: ESOPHAGOGASTRODUODENOSCOPY (EGD);  Surgeon: Manus Gunning, MD;  Location: [place];  Service: Gastroenterology;  Laterality: N/A;  enteroscopy  . ESOPHAGOGASTRODUODENOSCOPY N/A 09/26/2019   Procedure: ESOPHAGOGASTRODUODENOSCOPY (EGD);  Surgeon: Rogene Houston, MD;  [facility];  Service: Endoscopy;  Laterality: N/A;  1250  . GIVENS CAPSULE STUDY N/A 03/07/2019   Procedure: GIVENS CAPSULE STUDY;  Surgeon: Rogene Houston, MD;  [facility];  Service: Endoscopy;  Laterality: N/A;  7:30  . INTRAOPERATIVE TRANSESOPHAGEAL ECHOCARDIOGRAM  06/13/2012   Procedure: INTRAOPERATIVE TRANSESOPHAGEAL ECHOCARDIOGRAM;  Surgeon:  Grace Isaac, MD;  Location: [place];  Service: Open Heart Surgery;  Laterality: N/A;  . IR FLUORO GUIDE CV LINE RIGHT  06/17/2020  . IR GENERIC HISTORICAL  07/26/2016   IR FLUORO GUIDE CV LINE RIGHT 07/26/2016 Greggory Keen, MD [facility]-INTER[facility]  . IR GENERIC HISTORICAL  07/26/2016   IR US GUIDE VASC ACCESS RIGHT 07/26/2016 Greggory Keen, MD [facility]  . IR GENERIC HISTORICAL  08/02/2016   IR US GUIDE VASC ACCESS RIGHT 08/02/2016 Greggory Keen, MD [facility]  . IR GENERIC HISTORICAL  08/02/2016   IR FLUORO GUIDE CV LINE RIGHT 08/02/2016 Greggory Keen, MD [facility]  . IR RADIOLOGY PERIPHERAL GUIDED IV START  03/28/2017  . IR REMOVAL TUN CV CATH W/O FL  08/11/2020  . IR THROMBECTOMY AV FISTULA W/THROMBOLYSIS INC/SHUNT/IMG LEFT Left 06/17/2020  . IR US GUIDE VASC ACCESS LEFT  06/17/2020  . IR US GUIDE VASC ACCESS RIGHT  03/28/2017  . IR US GUIDE VASC ACCESS RIGHT  06/17/2020  . LEFT HEART CATH AND CORONARY ANGIOGRAPHY N/A 09/20/2016   Procedure: Left Heart  Cath and Coronary Angiography;  Surgeon: [name], MD;  Location: [place] CV LAB;  Service: Cardiovascular;  Laterality: N/A;  . LEFT HEART CATH AND CORS/GRAFTS ANGIOGRAPHY N/A 10/13/2016   Procedure: Left Heart Cath and Cors/Grafts Angiography;  Surgeon: Troy Sine, MD;  Location: [place] CV LAB;  Service: Cardiovascular;  Laterality: N/A;  . LEFT HEART CATH AND CORS/GRAFTS ANGIOGRAPHY N/A 07/13/2018   Procedure: LEFT HEART CATH AND CORS/GRAFTS ANGIOGRAPHY;  Surgeon: Martinique, Peter M, MD;  Location: [place] CV LAB;  Service: Cardiovascular;  Laterality: N/A;  . LEFT HEART CATHETERIZATION WITH CORONARY ANGIOGRAM N/A 12/15/2011   Procedure: LEFT HEART CATHETERIZATION WITH CORONARY ANGIOGRAM;  Surgeon: Burnell Blanks, MD;  Location: [facility] CATH LAB;  Service: Cardiovascular;  Laterality: N/A;  . LEFT HEART CATHETERIZATION WITH CORONARY ANGIOGRAM N/A 01/10/2012   Procedure: LEFT HEART CATHETERIZATION WITH CORONARY ANGIOGRAM;  Surgeon:  Peter M Martinique, MD;  Location: [facility] CATH LAB;  Service: Cardiovascular;  Laterality: N/A;  . LEFT HEART CATHETERIZATION WITH CORONARY ANGIOGRAM N/A 06/08/2012   Procedure: LEFT HEART CATHETERIZATION WITH CORONARY ANGIOGRAM;  Surgeon: Burnell Blanks, MD;  Location: [facility] CATH LAB;  Service: Cardiovascular;  Laterality: N/A;  . LEFT HEART CATHETERIZATION WITH CORONARY/GRAFT ANGIOGRAM N/A 12/10/2013   Procedure: LEFT HEART CATHETERIZATION WITH Beatrix Fetters;  Surgeon: [name], MD;  Location: [facility] CATH LAB;  Service: Cardiovascular;  Laterality: N/A;  . OVARY SURGERY     ovarian cancer  . POLYPECTOMY  03/09/2019   Procedure: POLYPECTOMY;  Surgeon: Rogene Houston, MD;  [facility];  Service: Endoscopy;;  cecal   . POLYPECTOMY N/A 09/26/2019   Procedure: DUODENAL POLYPECTOMY;  Surgeon: Rogene Houston, MD;  [facility];  Service: Endoscopy;  Laterality: N/A;  . REVISION OF ARTERIOVENOUS GORETEX GRAFT N/A 02/24/2017   Procedure: REVISION OF ARTERIOVENOUS GORETEX GRAFT (RESECTION);  Surgeon: Katha Cabal, MD;  Location: [facility] ORS;  Service: Vascular;  Laterality: N/A;  . REVISON OF ARTERIOVENOUS FISTULA Left 06/19/2020   Procedure: REVISION OF LEFT UPPER ARM AV GRAFT WITH INTERPOSITION JUMP GRAFT USING 6MM GORE LIMB;  Surgeon: Marty Heck, MD;  Location: [name];  Service: Vascular;  Laterality: Left;  . SHUNTOGRAM N/A 10/15/2013   Procedure: Fistulogram;  Surgeon: Serafina Mitchell, MD;  Location: [place] CATH LAB;  Service: Cardiovascular;  Laterality: N/A;  . THROMBECTOMY / ARTERIOVENOUS GRAFT REVISION  2011   left upper arm  . TUBAL LIGATION  1980's  . UPPER EXTREMITY ANGIOGRAPHY Bilateral 12/06/2016   Procedure: Upper Extremity Angiography;  Surgeon: Katha Cabal, MD;  Location: [place] CV LAB;  Service: Cardiovascular;  Laterality: Bilateral;  . UPPER EXTREMITY INTERVENTION Left 06/06/2017   Procedure: UPPER EXTREMITY INTERVENTION;   Surgeon: Katha Cabal, MD;  Location: [place] CV LAB;  Service: Cardiovascular;  Laterality: Left;   Family History:   Family History  Problem Relation Age of Onset  . Heart disease Mother        Heart Disease before age 79  . Hyperlipidemia Mother   . Hypertension Mother   . Diabetes Mother   . Heart attack Mother   . Heart disease Father        Heart Disease before age 16  . Hyperlipidemia Father   . Hypertension Father   . Diabetes Father   . Diabetes Sister   . Hypertension Sister   . Diabetes Brother   . Hyperlipidemia Brother   . Heart attack Brother   .  Hypertension Sister   . Heart attack Brother   . Colon cancer Child 86  . Other Other        noncontributory for early CAD  . Esophageal cancer Neg Hx   . Liver disease Neg Hx   . Kidney disease Neg Hx   . Colon polyps Neg Hx    Social History:  reports that she has never smoked. She has never used smokeless tobacco. She reports that she does not drink alcohol and does not use drugs. Allergies  Allergen Reactions  . Aspirin Other (See Comments)    High Doses Mess up her stomach; "makes my bowels have blood in them". Takes 81 mg EC Aspirin   . Penicillins Other (See Comments)    SYNCOPE? , "makes me real weak when I take it; like I'll pass out"  Has patient had a PCN reaction causing immediate rash, facial/tongue/throat swelling, SOB or lightheadedness with hypotension: Yes Has patient had a PCN reaction causing severe rash involving mucus membranes or skin necrosis: no Has patient had a PCN reaction that required hospitalization no Has patient had a PCN reaction occurring within the last 10 years: no If all of the above  . Amlodipine Swelling  . Bactrim [Sulfamethoxazole-Trimethoprim] Rash  . Contrast Media [Iodinated Diagnostic Agents] Itching  . Iron Itching and Other (See Comments)    "they gave me iron in dialysis; had to give me Benadryl cause I had to have the iron" (05/02/2012)  .  Nitrofurantoin Hives  . Tylenol [Acetaminophen] Itching and Other (See Comments)    Makes her feet on fire per pt  . Gabapentin Other (See Comments)    Unknown reaction  . Hydralazine Itching  . Ranexa [Ranolazine] Other (See Comments)    Myoclonus-hospitalized   . Dexilant [Dexlansoprazole] Other (See Comments)    Upset stomach  . Levaquin [Levofloxacin In D5w] Rash  . Morphine And Related Itching and Other (See Comments)    Itching in feet  . Plavix [Clopidogrel Bisulfate] Rash  . Protonix [Pantoprazole Sodium] Rash  . Venofer [Ferric Oxide] Itching and Other (See Comments)    Patient reports using Benadryl prior to doses as [place]   Prior to Admission medications   Medication Sig Start Date End Date Taking? Authorizing Provider  amiodarone (PACERONE) 200 MG tablet Take 1 tablet (200 mg total) by mouth daily. Starting July 2 12/17/19  Yes Strader, Fransisco Hertz, PA-C  aspirin EC 81 MG tablet Take 81 mg by mouth daily.  09/27/19  Yes Rehman, Mechele Dawley, MD  fluticasone (FLONASE) 50 MCG/ACT nasal spray Place 1 spray into both nostrils at bedtime as needed for allergies.    Yes [provider]  isosorbide mononitrate (IMDUR) 30 MG 24 hr tablet Take 3 tablets (90 mg total) by mouth 2 (two) times daily. 09/29/20  Yes Shahmehdi, Valeria Batman, MD  lidocaine-prilocaine (EMLA) cream Apply 1 application topically every Monday, Wednesday, and Friday. Prior to dialysis   Yes [provider]  loratadine (CLARITIN) 10 MG tablet Take 1 tablet (10 mg total) by mouth daily as needed for allergies. 07/14/18  Yes Hosie Poisson, MD  multivitamin (RENA-VIT) TABS tablet Take 1 tablet by mouth daily.   Yes [provider]  nitroGLYCERIN (NITROSTAT) 0.4 MG SL tablet Place 1 tablet (0.4 mg total) under the tongue every 5 (five) minutes x 3 doses as needed for chest pain (if no relief after 2nd dose, proceed to the ED for an evaluation or call 911). 09/08/20  Yes Verta Ellen., NP   omeprazole (PRILOSEC) 40 MG capsule Take 1 capsule (40 mg total) by mouth daily before breakfast. 03/31/20  Yes Laurine Blazer B, PA-C  sevelamer carbonate (RENVELA) 800 MG tablet Take 2 tablets (1,600 mg total) by mouth 3 (three) times daily with meals. 06/20/20  Yes Meccariello, Bernita Raisin, DO  simvastatin (ZOCOR) 20 MG tablet Take 1 tablet (20 mg total) by mouth at bedtime. 07/07/20  Yes Verta Ellen., NP   Current Facility-Administered Medications  Medication Dose Route Frequency Provider Last Rate Last Admin  . amiodarone (PACERONE) tablet 200 mg  200 mg Oral Daily Truett Mainland, DO   200 mg at 10/04/20 0347  . aspirin EC tablet 81 mg  81 mg Oral Daily Truett Mainland, DO   81 mg at 10/04/20 4259  . Chlorhexidine Gluconate Cloth 2 % PADS 6 each  6 each Topical Q0600 Truett Mainland, DO   6 each at 10/05/20 5638  . fluticasone (FLONASE) 50 MCG/ACT nasal spray 1 spray  1 spray Each Nare QHS PRN Truett Mainland, DO      . isosorbide mononitrate (IMDUR) 24 hr tablet 90 mg  90 mg Oral BID Truett Mainland, DO   90 mg at 10/04/20 2101  . loratadine (CLARITIN) tablet 10 mg  10 mg Oral Daily PRN Truett Mainland, DO      . MEDLINE mouth rinse  15 mL Mouth Rinse BID Truett Mainland, DO   15 mL at 10/04/20 2104  . ondansetron (ZOFRAN) tablet 4 mg  4 mg Oral Q6H PRN Truett Mainland, DO       Or  . ondansetron [facility]) injection 4 mg  4 mg Intravenous Q6H PRN Truett Mainland, DO      . sevelamer carbonate (RENVELA) tablet 1,600 mg  1,600 mg Oral TID WC Truett Mainland, DO   1,600 mg at 10/04/20 1811  . simvastatin (ZOCOR) tablet 20 mg  20 mg Oral QHS Truett Mainland, DO   20 mg at 10/04/20 2101   Labs: Basic Metabolic Panel: Recent Labs  Lab 09/29/20 0112 10/03/20 1225 10/04/20 0426  NA 139 144 139  K 4.3 4.5 4.8  CL 101 101 101  CO2 26 27 26   GLUCOSE 99 96 82  BUN 27* 56* 35*  CREATININE 5.44* 10.03* 7.48*  CALCIUM 9.7 10.6* 9.6   Liver Function Tests: No results for  input(s): AST, ALT, ALKPHOS, BILITOT, PROT, ALBUMIN in the last 168 hours. No results for input(s): LIPASE, AMYLASE in the last 168 hours. No results for input(s): AMMONIA in the last 168 hours. CBC: Recent Labs  Lab 09/29/20 0112 10/03/20 1225  WBC 5.6 7.4  NEUTROABS 3.6 4.8  HGB 9.8* 10.2*  HCT 33.0* 34.8*  MCV 123.1* 123.4*  PLT 210 254   Cardiac Enzymes: No results for input(s): CKTOTAL, CKMB, CKMBINDEX, TROPONINI in the last 168 hours. CBG: No results for input(s): GLUCAP in the last 168 hours. Iron Studies: No results for input(s): IRON, TIBC, TRANSFERRIN, FERRITIN in the last 72 hours. Studies/Results: DG Chest Portable 1 View  Result Date: 10/03/2020 CLINICAL DATA:  Shortness of breath for several hours, has not had dialysis treatment. History CHF, hypertension, chronic renal insufficiency, coronary artery disease post MI EXAM: PORTABLE CHEST 1 VIEW COMPARISON:  Portable exam 1455 hours compared to 09/29/2020 FINDINGS: Enlargement of cardiac silhouette post CABG and coronary stenting. Mild pulmonary vascular congestion. Mediastinal contours normal with atherosclerotic calcification  aorta noted. Minimal interstitial prominence bilaterally, likely reflecting minimal failure. Subsegmental atelectasis bibasilar greater on LEFT. No pleural effusion or pneumothorax. Bones demineralized. IMPRESSION: Enlargement of cardiac silhouette with pulmonary vascular congestion and question mild pulmonary edema/failure. Post CABG and coronary stenting. Scattered atelectasis greater at LEFT base. Aortic Atherosclerosis (ICD10-I70.0). Electronically Signed   By: Lavonia Dana M.D.   On: 10/03/2020 15:03    ROS: Pertinent items are noted in HPI. Physical Exam: Vitals:   10/04/20 2031 10/04/20 2129 10/04/20 2130 10/05/20 0555  BP: (!) 178/62 (!) 175/51 (!) 184/48 (!) 148/74  Pulse: 87 85 87 79  Resp:  18    Temp:  99.4 F (37.4 C)  98.6 F (37 C)  TempSrc:  Oral  Oral  SpO2: 95% 93% 93% 100%   Weight:      Height:          Weight change:  No intake or output data in the 24 hours ending 10/05/20 0917 BP (!) 148/74 (BP Location: Right Arm)   Pulse 79   Temp 98.6 F (37 C) (Oral)   Resp 18   Ht 5\' 1"  (1.549 m)   Wt 63.3 kg   SpO2 100%   BMI 26.37 kg/m  General appearance: alert, cooperative, no distress and wearing oxygen Head: Normocephalic, without obvious abnormality, atraumatic Resp: clear to auscultation bilaterally Cardio: regular rate and rhythm, S1, S2 normal, no murmur, click, rub or gallop GI: soft, non-tender; bowel sounds normal; no masses,  no organomegaly Extremities: extremities normal, atraumatic, no cyanosis or edema and LUE AVF +T/B, wrapped Dialysis Access:  Dialysis Orders: Center: [place]  on MWF . EDW 61kg HD Bath 2K/2.5Ca  Time 3.5 hrs Heparin none. Access LUE AVF BFR 300 DFR 600    Hectoral 2.5 mcg IV/HD Epogen 8000   Units IV/HD     Assessment/Plan: 1.  Complication of vascular access - awaiting TDC placement today, will need fistulogram as an outpatient.  Cannot contact home unit to notify them of plan 2.  ESRD -   Plan for HD today (not sure why she was off schedule last week but may have been due to recent covid infection). 3.  Hypertension/volume  - stable but using oxygen for unclear reasons.  UF with HD today to get to edw 4.  Anemia  - stable 5.  Metabolic bone disease -   Continue with home meds 6.  Nutrition - renal diet 7. Disposition - hopeful discharge to home after HD today.   Donetta Potts, MD [place] Pager [phone] 10/05/2020, 9:17 AM

## 2020-10-05 NOTE — Progress Notes (Signed)
Interventional Radiology Brief Note:  Patient s/p aborted HD catheter placement today with surgery.  In need of fistulagram with intervention vs. Tunneled HD catheter placement.   Plan to bring to [facility] tomorrow for noon procedure.   NPO p MN  RN aware and to arrange transport.   Brynda Greathouse, MS RD PA-C

## 2020-10-05 NOTE — Anesthesia Preprocedure Evaluation (Addendum)
Anesthesia Evaluation  Patient identified by MRN, date of birth, ID band Patient awake    Reviewed: Allergy & Precautions, NPO status , Patient's Chart, lab work & pertinent test results  History of Anesthesia Complications Negative for: history of anesthetic complications  Airway Mallampati: II  TM Distance: >3 FB Neck ROM: Full    Dental  (+) Dental Advisory Given, Missing   Pulmonary pneumonia, resolved,  IMPRESSION: Enlargement of cardiac silhouette with pulmonary vascular congestion and question mild pulmonary edema/failure.  Post CABG and coronary stenting.  Scattered atelectasis greater at LEFT base.  Aortic Atherosclerosis (ICD10-I70.0).   Electronically Signed   By: Lavonia Dana M.D.   On: 10/03/2020 15:03    breath sounds clear to auscultation       Cardiovascular Exercise Tolerance: Poor hypertension, Pt. on medications + angina + CAD, + Past MI, + Cardiac Stents, + CABG, + Peripheral Vascular Disease (carotid stenosis), +CHF and + DOE  Normal cardiovascular exam+ dysrhythmias Supra Ventricular Tachycardia + Valvular Problems/Murmurs MR  Rhythm:Regular Rate:Normal  1. The left ventricle has normal systolic function with an ejection  fraction of 60-65%. The cavity size was normal. There is mild concentric  left ventricular hypertrophy. Left ventricular diastolic Doppler  parameters are consistent with impaired  relaxation. Elevated left ventricular end-diastolic pressure No evidence  of left ventricular regional wall motion abnormalities.  2. Left atrial size was mildly dilated.  3. The mitral valve is grossly normal. Mild thickening of the mitral  valve leaflet. There is mild mitral annular calcification present. Mitral  valve regurgitation is mild to moderate by color flow Doppler.  4. The tricuspid valve is grossly normal.  5. The aortic valve is tricuspid. Mild thickening of the aortic valve.   6. Moderate aortic annular calcification.  7. The aortic root is normal in size and structure.    Neuro/Psych PSYCHIATRIC DISORDERS Anxiety TIA Neuromuscular disease (myoclonus)    GI/Hepatic Neg liver ROS, PUD, GERD  Medicated,  Endo/Other    Renal/GU DialysisRenal disease     Musculoskeletal  (+) Arthritis  (Gout),   Abdominal   Peds  Hematology  (+) anemia ,   Anesthesia Other Findings Vasovagal episode - 10/03/20   Reproductive/Obstetrics                           Anesthesia Physical Anesthesia Plan  ASA: IV  Anesthesia Plan: General   Post-op Pain Management:    Induction: Intravenous  PONV Risk Score and Plan: Propofol infusion  Airway Management Planned: Nasal Cannula, Natural Airway and Simple Face Mask  Additional Equipment:   Intra-op Plan:   Post-operative Plan:   Informed Consent: I have reviewed the patients History and Physical, chart, labs and discussed the procedure including the risks, benefits and alternatives for the proposed anesthesia with the patient or authorized representative who has indicated his/her understanding and acceptance.     Dental advisory given  Plan Discussed with: CRNA and Surgeon  Anesthesia Plan Comments:         Anesthesia Quick Evaluation

## 2020-10-05 NOTE — Progress Notes (Signed)
PROGRESS NOTE    Shawna [name]  ION:[id number] DOB: [birth year] DOA: 10/03/2020 PCP: [facility], [facility]   Brief Narrative:  Per HPI: Shawna Greet Moyeris a 80 y.o.femalewith a history of end-stage renal disease on hemodialysis Mondays, Wednesday, Friday, history of NSTEMI, coronary artery disease, paroxysmal atrial fibrillation rhythm controlled on amiodarone and not on anticoagulation, history of TIA. Patient had recent Covid infection, now cleared and hada shift in her dialysis schedule. She received dialysis on Thursday went to dialysis this morning. At the commencement of dialysis this morning, she developed blood clots in the line and dialysis was discontinued. She was sent to the emergency department for evaluation. She has some mild tenderness to the area with no palliating or provoking factors. She is otherwise asymptomatic. She is still in need of dialysis.  Assessment & Plan:   Active Problems:   GERD (gastroesophageal reflux disease)   Chronic diastolic CHF (congestive heart failure) (HCC)   Essential hypertension   ESRD (end stage renal disease) on dialysis (HCC)   Fluid overload   H/O non-ST elevation myocardial infarction (NSTEMI)   Paroxysmal atrial fibrillation (HCC)   History of GI bleed   Failure of surgically constructed arteriovenous fistula (HCC)   Vasovagal episode   Dialysis AV fistula malfunction, initial encounter ([place])   Jugular vein occlusion, right (HCC)   Failure of surgically constructed AV fistula -General surgery has tried hemodialysis catheter placement to right IJ on 3/28, but this attempt has failed -Patient will need to go to [facility] [place] on catheter placement and possible fistulogram  Volume overload in setting of ESRD with HD -Hemodialysis performed with temporary dialysis catheter to  R femoral region -Hemodialysis per nephrology planned for 3/28 -Usual dialysis is Monday Wednesday Friday  Paroxysmal atrial  fibrillation -Currently in sinus rhythm -Continue amiodarone  History of hypertension -Continue home Imdur  History of NSTEMI -Continue aspirin and statin  Chronic diastolic heart failure -Currently compensated  GERD  DVT prophylaxis: SCDs Code Status: Full Family Communication: Discussed with husband 3/28 on phone Disposition Plan:  Status is: Inpatient  Remains inpatient appropriate because:IV treatments appropriate due to intensity of illness or inability to take PO and Inpatient level of care appropriate due to severity of illness   Dispo: The patient is from: Home  Anticipated d/c is to: Home  Patient currently is not medically stable to d/c.              Difficult to place patient No  Consultants:   Nephrology  General Surgery  Procedures:   Attempted R IJ Dialysis catheter placement 3/28  See below  Antimicrobials:   None   Subjective: Patient seen and evaluated today with no new acute complaints or concerns. No acute concerns or events noted overnight.  Objective: Vitals:   10/05/20 1055 10/05/20 1350 10/05/20 1513 10/05/20 1515  BP: (!) 142/40 (!) 160/40 (!) 125/36 (!) 139/42  Pulse: 69 70    Resp: 18 16 14    Temp: 98.8 F (37.1 C) 98.9 F (37.2 C) (!) 97.5 F (36.4 C)   TempSrc: Oral Oral    SpO2: 93% 97% 100% 100%  Weight:      Height:        Intake/Output Summary (Last 24 hours) at 10/05/2020 1521 Last data filed at 10/05/2020 1501 Gross per 24 hour  Intake 300 ml  Output 0 ml  Net 300 ml   Filed Weights   10/03/20 1117 10/03/20 2000 10/03/20 2304  Weight: 62.7 kg 64 kg 63.3  kg    Examination:  General exam: Appears calm and comfortable  Respiratory system: Clear to auscultation. Respiratory effort normal. Currently on 2L [place]. Cardiovascular system: S1 & S2 heard, RRR.  Gastrointestinal system: Abdomen is soft Central nervous system: Alert and awake Extremities: No edema Skin: No  significant lesions noted Psychiatry: Flat affect.    Data Reviewed: I have personally reviewed following labs and imaging studies  CBC: Recent Labs  Lab 09/29/20 0112 10/03/20 1225 10/05/20 1219  WBC 5.6 7.4 7.3  NEUTROABS 3.6 4.8  --   HGB 9.8* 10.2* 7.9*  HCT 33.0* 34.8* 25.8*  MCV 123.1* 123.4* 121.1*  PLT 210 254 833   Basic Metabolic Panel: Recent Labs  Lab 09/29/20 0112 10/03/20 1225 10/04/20 0426 10/05/20 1219  NA 139 144 139 137  K 4.3 4.5 4.8 5.2*  CL 101 101 101 99  CO2 26 27 26 25   GLUCOSE 99 96 82 96  BUN 27* 56* 35* 60*  CREATININE 5.44* 10.03* 7.48* 11.12*  CALCIUM 9.7 10.6* 9.6 9.6  MG  --   --   --  1.9   GFR: Estimated Creatinine Clearance: 3.4 mL/min (A) (by C-G formula based on SCr of 11.12 mg/dL (H)). Liver Function Tests: No results for input(s): AST, ALT, ALKPHOS, BILITOT, PROT, ALBUMIN in the last 168 hours. No results for input(s): LIPASE, AMYLASE in the last 168 hours. No results for input(s): AMMONIA in the last 168 hours. Coagulation Profile: No results for input(s): INR, PROTIME in the last 168 hours. Cardiac Enzymes: No results for input(s): CKTOTAL, CKMB, CKMBINDEX, TROPONINI in the last 168 hours. BNP (last 3 results) No results for input(s): PROBNP in the last 8760 hours. HbA1C: No results for input(s): HGBA1C in the last 72 hours. CBG: No results for input(s): GLUCAP in the last 168 hours. Lipid Profile: No results for input(s): CHOL, HDL, LDLCALC, TRIG, CHOLHDL, LDLDIRECT in the last 72 hours. Thyroid Function Tests: No results for input(s): TSH, T4TOTAL, FREET4, T3FREE, THYROIDAB in the last 72 hours. Anemia Panel: No results for input(s): VITAMINB12, FOLATE, FERRITIN, TIBC, IRON, RETICCTPCT in the last 72 hours. Sepsis Labs: Recent Labs  Lab 09/29/20 0656  PROCALCITON 0.64    Recent Results (from the past 240 hour(s))  Resp Panel by RT-PCR (Flu A&B, Covid) Nasopharyngeal Swab     Status: Abnormal   Collection  Time: 09/29/20  3:21 AM   Specimen: Nasopharyngeal Swab; Nasopharyngeal(NP) swabs in vial transport medium  Result Value Ref Range Status   SARS Coronavirus 2 by RT PCR POSITIVE (A) NEGATIVE Final    Comment: RESULT CALLED TO, READ BACK BY AND VERIFIED WITH: K NICHOLS,RN@0511  09/29/20 MKELLY (NOTE) SARS-CoV-2 target nucleic acids are DETECTED.  The SARS-CoV-2 RNA is generally detectable in upper respiratory specimens during the acute phase of infection. Positive results are indicative of the presence of the identified virus, but do not rule out bacterial infection or co-infection with other pathogens not detected by the test. Clinical correlation with patient history and other diagnostic information is necessary to determine patient infection status. The expected result is Negative.  Fact Sheet for Patients: EntrepreneurPulse.com.au  Fact Sheet for Healthcare Providers: IncredibleEmployment.be  This test is not yet approved or cleared by the [place] FDA and  has been authorized for detection and/or diagnosis of SARS-CoV-2 by FDA under an Emergency Use Authorization (EUA).  This EUA will remain in effect (meaning this test can be  used) for the duration of  the COVID-19 declaration under Section 564(b)(1)  of the Act, 21 U.S.C. section 360bbb-3(b)(1), unless the authorization is terminated or revoked sooner.     Influenza A by PCR NEGATIVE NEGATIVE Final   Influenza B by PCR NEGATIVE NEGATIVE Final    Comment: (NOTE) The Xpert Xpress SARS-CoV-2/FLU/RSV plus assay is intended as an aid in the diagnosis of influenza from Nasopharyngeal swab specimens and should not be used as a sole basis for treatment. Nasal washings and aspirates are unacceptable for Xpert Xpress SARS-CoV-2/FLU/RSV testing.  Fact Sheet for Patients: EntrepreneurPulse.com.au  Fact Sheet for Healthcare  Providers: IncredibleEmployment.be  This test is not yet approved or cleared by the [place] FDA and has been authorized for detection and/or diagnosis of SARS-CoV-2 by FDA under an Emergency Use Authorization (EUA). This EUA will remain in effect (meaning this test can be used) for the duration of the COVID-19 declaration under Section 564(b)(1) of the Act, 21 U.S.C. section 360bbb-3(b)(1), unless the authorization is terminated or revoked.  Performed at [facility], [address], [place], [place] [address]   Resp Panel by RT-PCR (Flu A&B, Covid) Nasopharyngeal Swab     Status: None   Collection Time: 10/03/20  6:05 PM   Specimen: Nasopharyngeal Swab; Nasopharyngeal(NP) swabs in vial transport medium  Result Value Ref Range Status   SARS Coronavirus 2 by RT PCR NEGATIVE NEGATIVE Final    Comment: (NOTE) SARS-CoV-2 target nucleic acids are NOT DETECTED.  The SARS-CoV-2 RNA is generally detectable in upper respiratory specimens during the acute phase of infection. The lowest concentration of SARS-CoV-2 viral copies this assay can detect is 138 copies/mL. A negative result does not preclude SARS-Cov-2 infection and should not be used as the sole basis for treatment or other patient management decisions. A negative result may occur with  improper specimen collection/handling, submission of specimen other than nasopharyngeal swab, presence of viral mutation(s) within the areas targeted by this assay, and inadequate number of viral copies(<138 copies/mL). A negative result must be combined with clinical observations, patient history, and epidemiological information. The expected result is Negative.  Fact Sheet for Patients:  EntrepreneurPulse.com.au  Fact Sheet for Healthcare Providers:  IncredibleEmployment.be  This test is no t yet approved or cleared by the [place] FDA and  has been authorized for detection  and/or diagnosis of SARS-CoV-2 by FDA under an Emergency Use Authorization (EUA). This EUA will remain  in effect (meaning this test can be used) for the duration of the COVID-19 declaration under Section 564(b)(1) of the Act, 21 U.S.C.section 360bbb-3(b)(1), unless the authorization is terminated  or revoked sooner.       Influenza A by PCR NEGATIVE NEGATIVE Final   Influenza B by PCR NEGATIVE NEGATIVE Final    Comment: (NOTE) The Xpert Xpress SARS-CoV-2/FLU/RSV plus assay is intended as an aid in the diagnosis of influenza from Nasopharyngeal swab specimens and should not be used as a sole basis for treatment. Nasal washings and aspirates are unacceptable for Xpert Xpress SARS-CoV-2/FLU/RSV testing.  Fact Sheet for Patients: EntrepreneurPulse.com.au  Fact Sheet for Healthcare Providers: IncredibleEmployment.be  This test is not yet approved or cleared by the [place] FDA and has been authorized for detection and/or diagnosis of SARS-CoV-2 by FDA under an Emergency Use Authorization (EUA). This EUA will remain in effect (meaning this test can be used) for the duration of the COVID-19 declaration under Section 564(b)(1) of the Act, 21 U.S.C. section 360bbb-3(b)(1), unless the authorization is terminated or revoked.  Performed at [facility], [address], [place], [place] [address]  MRSA PCR Screening     Status: None   Collection Time: 10/03/20 11:00 PM   Specimen: Nasopharyngeal  Result Value Ref Range Status   MRSA by PCR NEGATIVE NEGATIVE Final    Comment:        The GeneXpert MRSA Assay (FDA approved for NASAL specimens only), is one component of a comprehensive MRSA colonization surveillance program. It is not intended to diagnose MRSA infection nor to guide or monitor treatment for MRSA infections. Performed at [facility], [address], [place], [place] [address]          Radiology Studies: No results  found.      Scheduled Meds: . [MAR Hold] amiodarone  200 mg Oral Daily  . [MAR Hold] aspirin EC  81 mg Oral Daily  . [MAR Hold] Chlorhexidine Gluconate Cloth  6 each Topical Q0600  . Chlorhexidine Gluconate Cloth  6 each Topical Once   And  . Chlorhexidine Gluconate Cloth  6 each Topical Once  . [MAR Hold] isosorbide mononitrate  90 mg Oral BID  . [MAR Hold] mouth rinse  15 mL Mouth Rinse BID  . [MAR Hold] sevelamer carbonate  1,600 mg Oral TID WC  . [MAR Hold] simvastatin  20 mg Oral QHS   Continuous Infusions: . sodium chloride    . vancomycin       LOS: 2 days    Time spent: 35 minutes    Lynnetta Tom Darleen Crocker, DO [facility]  If 7PM-7AM, please contact night-coverage [email or web address] 10/05/2020, 3:21 PM

## 2020-10-06 ENCOUNTER — Ambulatory Visit (HOSPITAL_COMMUNITY)
Admission: RE | Admit: 2020-10-06 | Discharge: 2020-10-06 | Disposition: A | Discharge status: Home / Self Care | Payer: Medicare HMO | Source: Ambulatory Visit | Attending: Internal Medicine | Admitting: Internal Medicine

## 2020-10-06 ENCOUNTER — Encounter (HOSPITAL_COMMUNITY): Payer: Self-pay | Admitting: General Surgery

## 2020-10-06 DIAGNOSIS — Y841 Kidney dialysis as the cause of abnormal reaction of the patient, or of later complication, without mention of misadventure at the time of the procedure: Secondary | ICD-10-CM | POA: Insufficient documentation

## 2020-10-06 DIAGNOSIS — Z888 Allergy status to other drugs, medicaments and biological substances status: Secondary | ICD-10-CM | POA: Insufficient documentation

## 2020-10-06 DIAGNOSIS — N186 End stage renal disease: Secondary | ICD-10-CM | POA: Insufficient documentation

## 2020-10-06 DIAGNOSIS — Z880 Allergy status to penicillin: Secondary | ICD-10-CM | POA: Insufficient documentation

## 2020-10-06 DIAGNOSIS — I120 Hypertensive chronic kidney disease with stage 5 chronic kidney disease or end stage renal disease: Secondary | ICD-10-CM | POA: Insufficient documentation

## 2020-10-06 DIAGNOSIS — Z885 Allergy status to narcotic agent status: Secondary | ICD-10-CM | POA: Insufficient documentation

## 2020-10-06 DIAGNOSIS — T82510A Breakdown (mechanical) of surgically created arteriovenous fistula, initial encounter: Secondary | ICD-10-CM | POA: Insufficient documentation

## 2020-10-06 DIAGNOSIS — Z886 Allergy status to analgesic agent status: Secondary | ICD-10-CM | POA: Insufficient documentation

## 2020-10-06 DIAGNOSIS — Z79899 Other long term (current) drug therapy: Secondary | ICD-10-CM | POA: Insufficient documentation

## 2020-10-06 DIAGNOSIS — Z91041 Radiographic dye allergy status: Secondary | ICD-10-CM | POA: Insufficient documentation

## 2020-10-06 DIAGNOSIS — Z7982 Long term (current) use of aspirin: Secondary | ICD-10-CM | POA: Insufficient documentation

## 2020-10-06 DIAGNOSIS — Z992 Dependence on renal dialysis: Secondary | ICD-10-CM | POA: Insufficient documentation

## 2020-10-06 HISTORY — PX: IR DIALY SHUNT INTRO NEEDLE/INTRACATH INITIAL W/IMG LEFT: IMG6102

## 2020-10-06 IMAGING — XA IR DAILY SHUNT INTRO NEEDLE/INTRACATH INITIAL W/IMG*L*
4 series · 16 of 24 positions shown · IV contrast (IODINE)
Comparison: None.

INDICATION: 79-year-old female with end-stage renal disease and left upper
extremity brachial basilic fistula with jump graft presenting with
difficulty with cannulation

EXAM:
DIALYSIS AV FISTULAGRAM

[Series 1: ir daily shunt intro needle/intracath initial w/im · 5 of 16 slices shown]
[im 1/16]
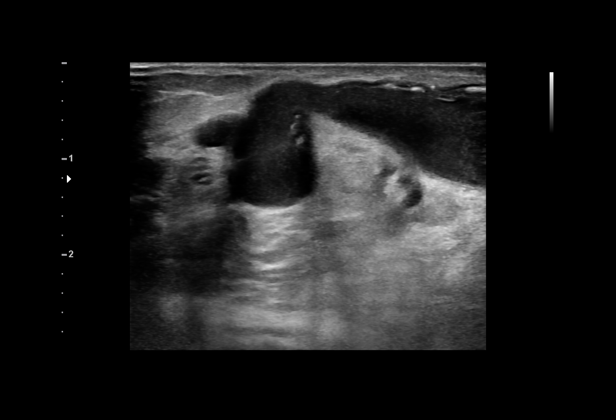
[im 5/16]
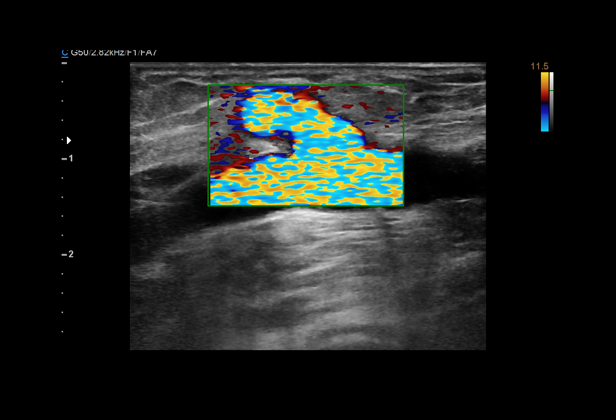
[im 7/16]
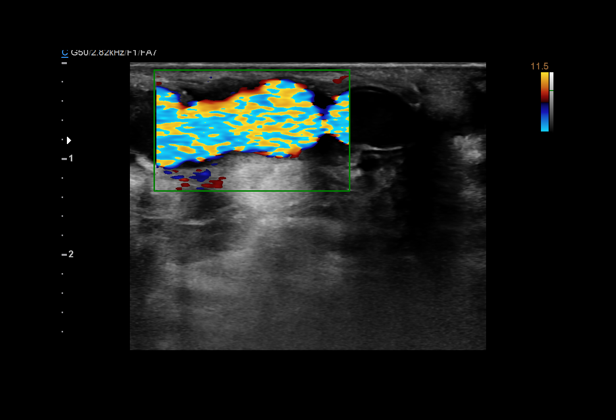
[im 11/16]
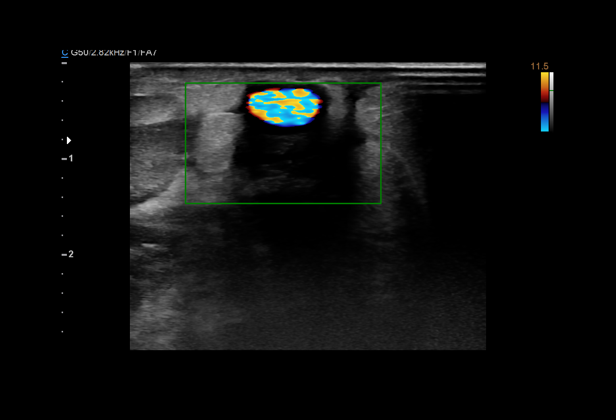
[im 13/16]
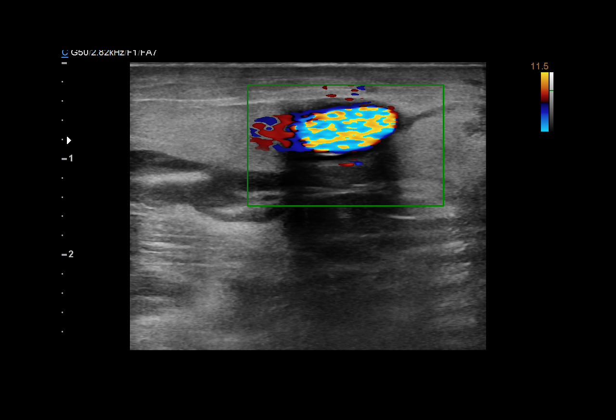

[Series 1: body 4 care · 6 acquisitions, 4 frames shown (1 of 3)]
[im 1/6]
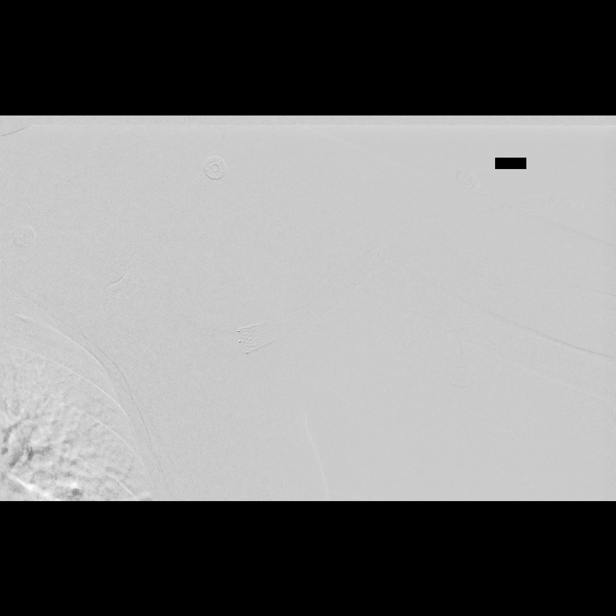
[im 1/6]
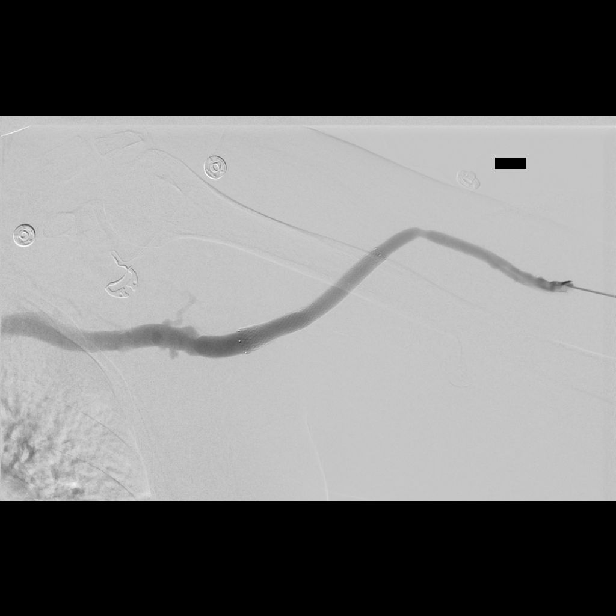
[im 2/6]
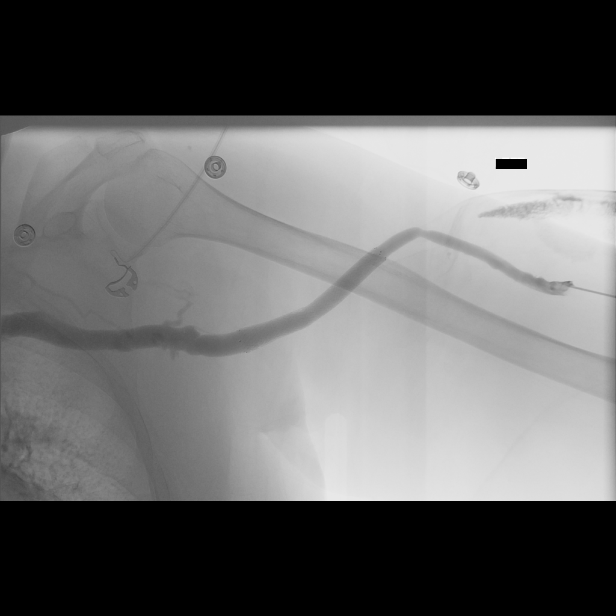
[im 4/6]
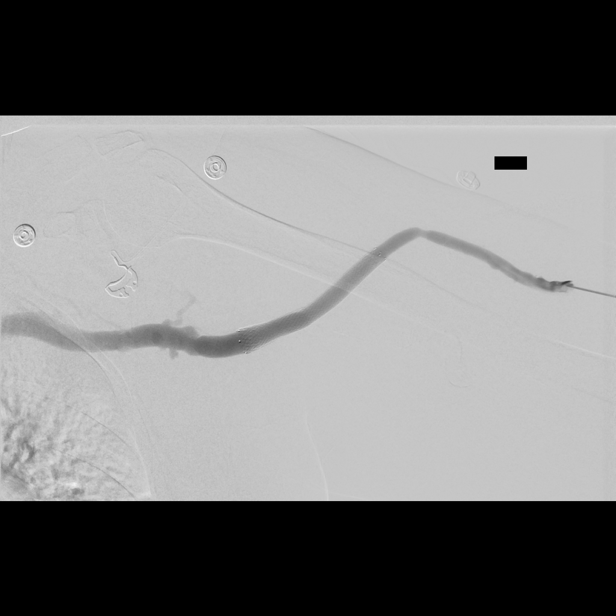

[Series 2: body 4 care · 7 acquisitions, 4 frames shown (2 of 3)]
[im 1/7]
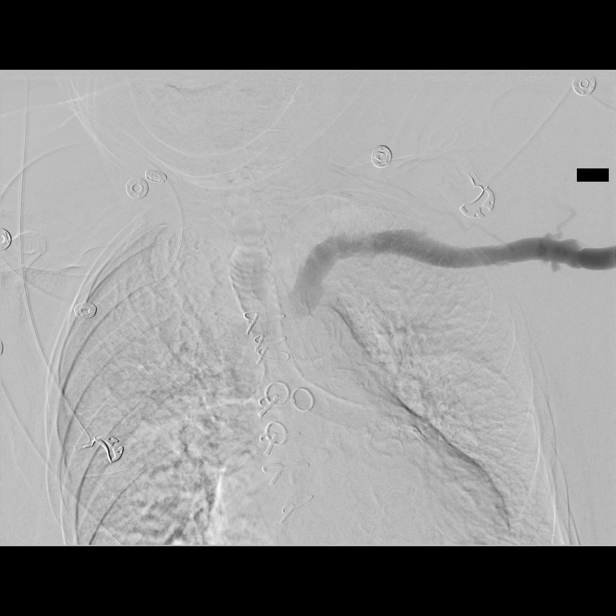
[im 1/7]
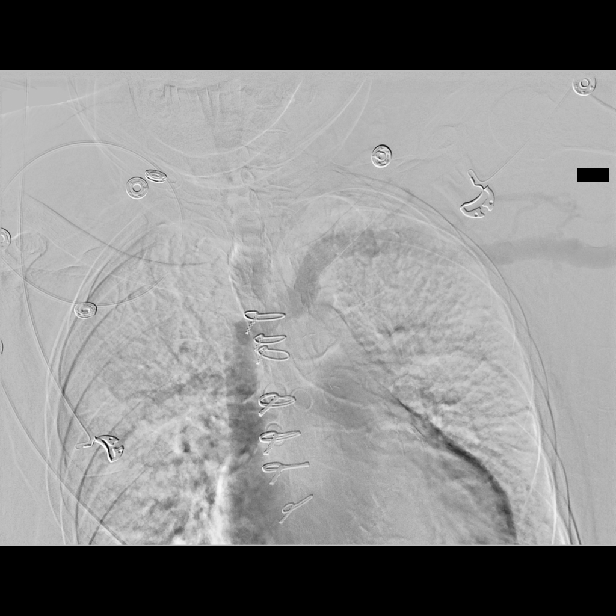
[im 4/7]
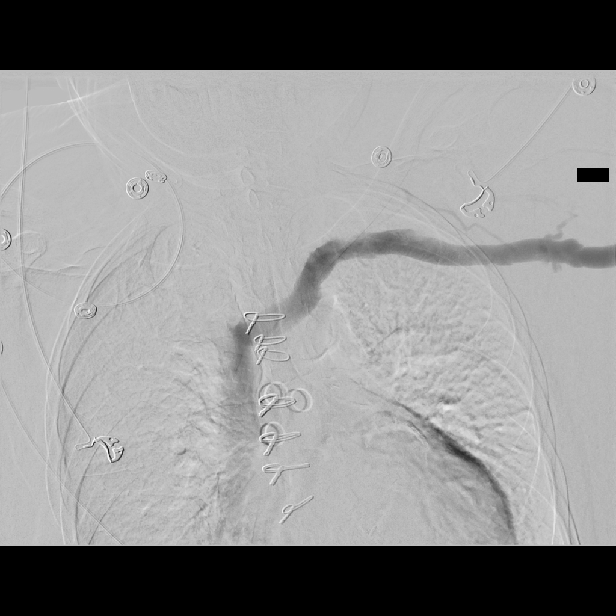
[im 5/7]
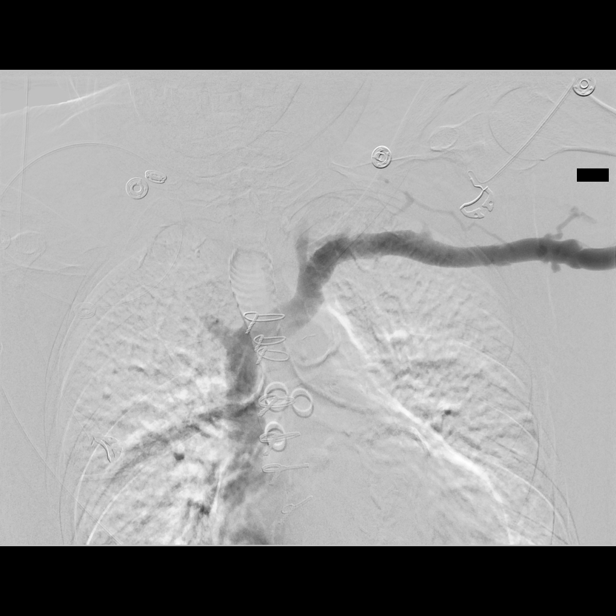

[Series 3: body 4 care · 5 acquisitions, 3 frames shown (3 of 3)]
[im 1/5]
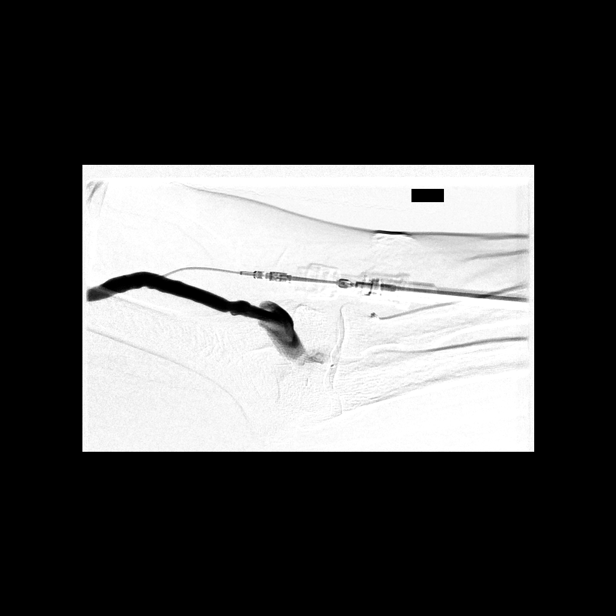
[im 1/5]
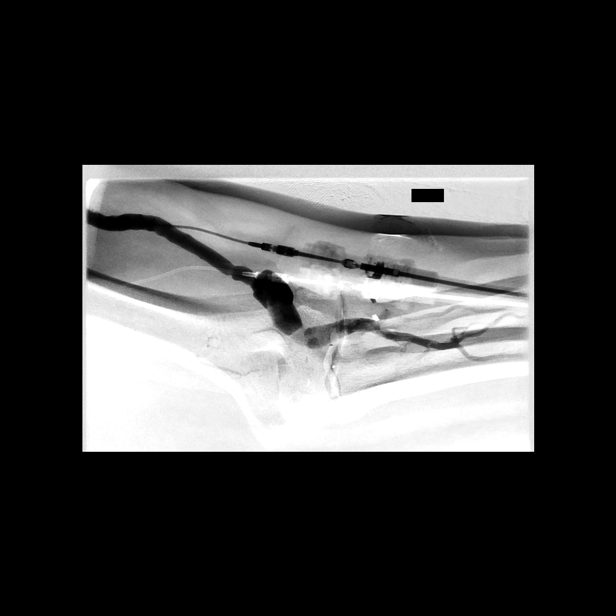
[im 5/5]
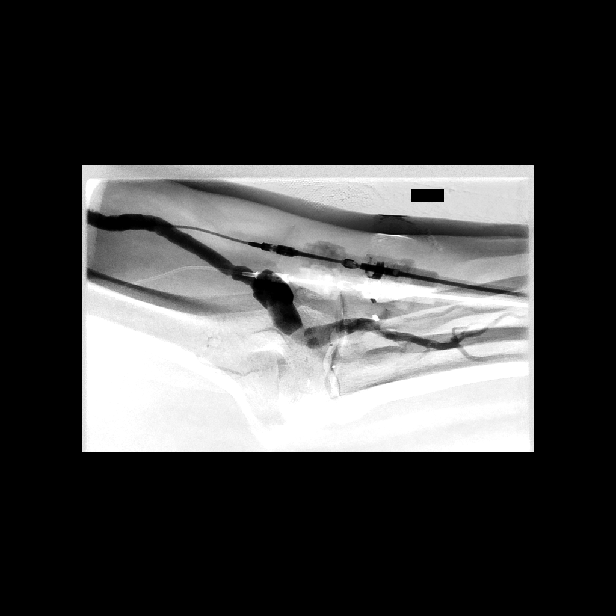

[16 of 24 positions shown; findings below may reference images not displayed]

MEDICATIONS:
None.

CONTRAST:  24mL OMNIPAQUE IOHEXOL 300 MG/ML  SOLN

ANESTHESIA/SEDATION:
Sedation time

0 minutes

FLUOROSCOPY TIME:  0 minutes.  Eighteen seconds.  Six mGy

COMPLICATIONS:
None immediate.

PROCEDURE:
The left arm dialysis graft was prepped with ChloraPrep in a sterile
fashion, and a sterile drape was applied covering the operative
field. A diagnostic shunt study was performed via an 18 gauge
angiocatheter introduced into venous outflow. Venous drainage was
assessed to the level of the central veins in the chest. Proximal
shunt was studied by reflux maneuver with temporary compression of
venous outflow.

The angiocath was removed and hemostasis was achieved with manual
compression. A dressing was placed. The patient tolerated the
procedure well without immediate post procedural complication.
FINDINGS: Patent arteriovenous anastomosis, puncture zone, venous outflow
including indwelling stent, and central veins. No evidence of
flow-limiting stenosis.
IMPRESSION: Widely patent left upper extremity arteriovenous fistula. No
intervention was performed.

ACCESS:
This access remains amenable to future percutaneous interventions as
clinically indicated.

## 2020-10-06 MED ORDER — MIDAZOLAM HCL 2 MG/2ML IJ SOLN
INTRAMUSCULAR | Status: AC
  Filled 2020-10-06: qty 2

## 2020-10-06 MED ORDER — IOHEXOL 300 MG/ML  SOLN
150.0000 mL | Freq: Once | INTRAMUSCULAR | Status: AC | PRN
  Administered 2020-10-06: 24 mL via INTRAVENOUS

## 2020-10-06 MED ORDER — FENTANYL CITRATE (PF) 100 MCG/2ML IJ SOLN
INTRAMUSCULAR | Status: AC
  Filled 2020-10-06: qty 2

## 2020-10-06 MED ORDER — LIDOCAINE HCL 1 % IJ SOLN
INTRAMUSCULAR | Status: AC
  Filled 2020-10-06: qty 20

## 2020-10-06 NOTE — Sedation Documentation (Signed)
[facility] is here and taking over care of patient. Will transport to [facility].

## 2020-10-06 NOTE — Care Management Important Message (Signed)
Important Message  Patient Details  Name: Shawna [name] MRN: [id number] Date of Birth: Apr 26, 1941   Medicare Important Message Given:  Yes     [name] 10/06/2020, 2:17 PM

## 2020-10-06 NOTE — Progress Notes (Signed)
Patient ID: Shawna [name], female   DOB: 13-Feb-1941, 80 y.o.   MRN: [id number] S: Confusion noted by night time RN.  Awake and alert this am without complaints O:BP (!) 159/52   Pulse 81   Temp 97.9 F (36.6 C)   Resp 19   Ht 5\' 1"  (1.549 m)   Wt 61.8 kg   SpO2 96%   BMI 25.74 kg/m   Intake/Output Summary (Last 24 hours) at 10/06/2020 0857 Last data filed at 10/05/2020 2016 Gross per 24 hour  Intake 300 ml  Output 1132 ml  Net -832 ml   Intake/Output: I/O last 3 completed shifts: In: 300 [I.V.:100; IV Piggyback:200] Out: 1132 [Other:1132]  Intake/Output this shift:  No intake/output data recorded. Weight change:  Gen: NAD CVS: RRR Resp: CTA Abd: +BS, soft, NT/ND Ext: no edema, LUE AVF +T/B  Recent Labs  Lab 10/03/20 1225 10/04/20 0426 10/05/20 1219  NA 144 139 137  K 4.5 4.8 5.2*  CL 101 101 99  CO2 27 26 25   GLUCOSE 96 82 96  BUN 56* 35* 60*  CREATININE 10.03* 7.48* 11.12*  CALCIUM 10.6* 9.6 9.6   Liver Function Tests: No results for input(s): AST, ALT, ALKPHOS, BILITOT, PROT, ALBUMIN in the last 168 hours. No results for input(s): LIPASE, AMYLASE in the last 168 hours. No results for input(s): AMMONIA in the last 168 hours. CBC: Recent Labs  Lab 10/03/20 1225 10/05/20 1219  WBC 7.4 7.3  NEUTROABS 4.8  --   HGB 10.2* 7.9*  HCT 34.8* 25.8*  MCV 123.4* 121.1*  PLT 254 197   Cardiac Enzymes: No results for input(s): CKTOTAL, CKMB, CKMBINDEX, TROPONINI in the last 168 hours. CBG: No results for input(s): GLUCAP in the last 168 hours.  Iron Studies: No results for input(s): IRON, TIBC, TRANSFERRIN, FERRITIN in the last 72 hours. Studies/Results: DG Chest Port 1 View  Result Date: 10/05/2020 CLINICAL DATA:  Dialysis catheter placement was attempted but was unsuccessful EXAM: PORTABLE CHEST 1 VIEW COMPARISON:  Portable exam 1624 hours compared to 10/03/2020 FINDINGS: Enlargement of cardiac silhouette with vascular congestion post CABG. Atherosclerotic  calcification aorta. Chronic accentuation of perihilar interstitial markings, stable. Subsegmental atelectasis LEFT mid lung. Small bibasilar effusions and atelectasis. No pneumothorax or acute osseous findings. IMPRESSION: Enlargement of cardiac silhouette with pulmonary vascular congestion. Chronic accentuation of markings with bibasilar atelectasis and small effusions. No pneumothorax. Electronically Signed   By: Lavonia Dana M.D.   On: 10/05/2020 17:05   . amiodarone  200 mg Oral Daily  . aspirin EC  81 mg Oral Daily  . Chlorhexidine Gluconate Cloth  6 each Topical Q0600  . diphenhydrAMINE  50 mg Intravenous Once  . isosorbide mononitrate  90 mg Oral BID  . mouth rinse  15 mL Mouth Rinse BID  . predniSONE  50 mg Oral Once  . sevelamer carbonate  1,600 mg Oral TID WC  . simvastatin  20 mg Oral QHS    BMET    Component Value Date/Time   NA 137 10/05/2020 1219   K 5.2 (H) 10/05/2020 1219   CL 99 10/05/2020 1219   CO2 25 10/05/2020 1219   GLUCOSE 96 10/05/2020 1219   BUN 60 (H) 10/05/2020 1219   CREATININE 11.12 (H) 10/05/2020 1219   CREATININE 6.57 (H) 03/05/2019 1159   CALCIUM 9.6 10/05/2020 1219   CALCIUM 8.1 (L) 05/29/2013 1814   GFRNONAA 3 (L) 10/05/2020 1219   GFRNONAA 6 (L) 03/05/2019 1159   GFRAA 9 (L) 02/12/2020  1712   GFRAA 6 (L) 03/05/2019 1159   CBC    Component Value Date/Time   WBC 7.3 10/05/2020 1219   RBC 2.13 (L) 10/05/2020 1219   HGB 7.9 (L) 10/05/2020 1219   HCT 25.8 (L) 10/05/2020 1219   PLT 197 10/05/2020 1219   MCV 121.1 (H) 10/05/2020 1219   MCH 37.1 (H) 10/05/2020 1219   MCHC 30.6 10/05/2020 1219   RDW 18.6 (H) 10/05/2020 1219   LYMPHSABS 0.7 10/03/2020 1225   MONOABS 0.9 10/03/2020 1225   EOSABS 0.8 (H) 10/03/2020 1225   BASOSABS 0.1 10/03/2020 1225     Dialysis Orders: Center: [facility]  on MWF . EDW 61kg HD Bath 2K/2.5Ca  Time 3.5 hrs Heparin none. Access LUE AVF BFR 300 DFR 600    Hectoral 2.5 mcg IV/HD Epogen 8000   Units IV/HD      Assessment/Plan: 1.  Complication of vascular access - unable to successfully place RIJ TDC yesterday.  will need to consult IR and transfer to [facility] for placement +/- fistulogram if able otherwise will need outpatient fistulogram with [facility].   2.  ESRD -   was on MWF schedule until she was covid + and is now on TTS at covid unit.  She is to resume MWF schedule next week.  Off schedule for now due to volume.   3.  Hypertension/volume  - improved with HD yesterday.  Off of oxygen.  4.  Anemia  - stable 5.  Metabolic bone disease -   Continue with home meds 6.  Nutrition - renal diet 7. Disposition - hopeful discharge to home after placement of TDC.    Donetta Potts, MD [employer] [phone]

## 2020-10-06 NOTE — Consult Note (Signed)
Chief Complaint: Patient was seen in consultation today for ESRD on HD/fistulagram with possible intervention.  Referring Physician(s): Rodena Goldmann [facility])  Supervising Physician: Ruthann Cancer  Patient Status: [facility] - In-pt  History of Present Illness: Shawna [name] is a 80 y.o. female with a past medical history of hypertension, hyperlipidemia, CAD,  MI, PAF not on chronic anticoagulation, HF, TIA, chronic bronchitis, ESRD on HD, prior GI bleed, arthritis, gout, and anxiety. She has been admitted to [facility] since 10/03/2020 for management of fistula malfunction. Per notes, she went to dialysis on Saturday and it was unable to be completed (clots pulled from fistula). In ED, right non-tunneled HD catheter was placed bedside so patient could undergo dialysis. Nephrology was consulted on admission as well. She was then taken to OR Monday 10/05/2020 with general surgery for possible tunneled HD catheter placement, however procedure was aborted secondary to occlusion of right IJ. She is in need of long-term HD access.  IR consulted by Dr. Manuella Ghazi for possible image-guided fistulagram with possible intervention versus tunneled HD catheter placement. Patient awake and alert sitting in bed. Appears confused- aware she is at "[facility]" however believes we are in the year 63. Per notes has intermittent confusion and needs to be redirected multiple times. History difficult to obtain secondary to this.   Past Medical History:  Diagnosis Date  . Acute on chronic respiratory failure with hypoxia ([place]) 10/10/2016  . Anxiety   . Arthritis   . AVM (arteriovenous malformation) of colon   . CAD (coronary artery disease)    a. s/p CABG in 2013 b. DES to D1 in 10/2016. c. cath in 07/2018 showing patent grafts with occlusion of D1 at prior stent site and progression of PDA disease --> medical management recommended  . Carotid artery disease ([place])    a. 16-01% LICA, 0/9323   . Chronic anemia   . Chronic bronchitis  ([place])   . Chronic diastolic CHF (congestive heart failure) ([place])    a. 02/2012 Echo EF 60-65%, nl wall motion, Gr 1 DD, mod MR  . Colon cancer ([place]) 1992  . Esophageal stricture   . ESRD on hemodialysis ([place])    ESRD due to HTN, started dialysis 2011 and gets HD at [facility] with Dr Hinda Lenis on MWF schedule.  Access is LUA AVF as of Sept 2014.   Marland Kitchen GERD (gastroesophageal reflux disease)   . High cholesterol 12/2011  . History of blood transfusion 07/2011; 12/2011; 01/2012 X 2; 04/2012  . History of gout   . History of lower GI bleeding   . Hypertension   . Iron deficiency anemia   . Mitral regurgitation    a. Moderate by echo, 02/2012  . Myocardial infarction ([place])   . NSVT (nonsustained ventricular tachycardia) ([place])   . Ovarian cancer ([place]) 1992  . PAF (paroxysmal atrial fibrillation) ([place])   . Pneumonia ~ 2009  . PUD (peptic ulcer disease)   . TIA (transient ischemic attack)     Past Surgical History:  Procedure Laterality Date  . A/V SHUNTOGRAM Left 03/19/2019   Procedure: A/V SHUNTOGRAM;  Surgeon: Katha Cabal, MD;  Location: [place] [facility];  Service: Cardiovascular;  Laterality: Left;  . ABDOMINAL HYSTERECTOMY  1992  . APPENDECTOMY  06/1990  . AV FISTULA PLACEMENT  07/2009   left upper arm  . AV FISTULA PLACEMENT Right 09/06/2016   Procedure: RIGHT FOREARM ARTERIOVENOUS (AV) GRAFT;  Surgeon: Elam Dutch, MD;  Location: [place];  Service: Vascular;  Laterality: Right;  . AV FISTULA PLACEMENT N/A 02/24/2017   Procedure: INSERTION OF ARTERIOVENOUS (AV) GORE-TEX GRAFT ARM (BRACHIAL AXILLARY);  Surgeon: Katha Cabal, MD;  Location: [facility] ORS;  Service: Vascular;  Laterality: N/A;  . [place] Right 09/06/2016   Procedure: REMOVAL OF Right Arm ARTERIOVENOUS GORETEX GRAFT and Vein Patch angioplasty of brachial artery;  Surgeon: Angelia Mould, MD;  Location: [place];  Service: Vascular;  Laterality: Right;  . BIOPSY  09/26/2019   Procedure: BIOPSY;  Surgeon:  Rogene Houston, MD;  [facility];  Service: Endoscopy;;  . COLON RESECTION  1992  . COLON SURGERY    . COLONOSCOPY N/A 03/09/2019   Procedure: COLONOSCOPY;  Surgeon: Rogene Houston, MD;  [facility];  Service: Endoscopy;  Laterality: N/A;  . CORONARY ANGIOPLASTY WITH STENT PLACEMENT  12/15/11   "2"  . CORONARY ANGIOPLASTY WITH STENT PLACEMENT  y/2013   "1; makes total of 3" (05/02/2012)  . CORONARY ARTERY BYPASS GRAFT  06/13/2012   Procedure: CORONARY ARTERY BYPASS GRAFTING (CABG);  Surgeon: Grace Isaac, MD;  Location: [place];  Service: Open Heart Surgery;  Laterality: N/A;  cabg x four;  using left internal mammary artery, and left leg greater saphenous vein harvested endoscopically  . CORONARY STENT INTERVENTION N/A 10/13/2016   Procedure: Coronary Stent Intervention;  Surgeon: Troy Sine, MD;  Location: [place] CV LAB;  Service: Cardiovascular;  Laterality: N/A;  . DIALYSIS/PERMA CATHETER REMOVAL N/A 04/18/2017   Procedure: DIALYSIS/PERMA CATHETER REMOVAL;  Surgeon: Katha Cabal, MD;  Location: [place] [facility];  Service: Cardiovascular;  Laterality: N/A;  . DILATION AND CURETTAGE OF UTERUS    . ESOPHAGOGASTRODUODENOSCOPY  01/20/2012   Procedure: ESOPHAGOGASTRODUODENOSCOPY (EGD);  Surgeon: Ladene Artist, MD,FACG;  Location: [facility] ENDOSCOPY;  Service: Endoscopy;  Laterality: N/A;  . ESOPHAGOGASTRODUODENOSCOPY N/A 03/26/2013   Procedure: ESOPHAGOGASTRODUODENOSCOPY (EGD);  Surgeon: Irene Shipper, MD;  Location: [facility] ENDOSCOPY;  Service: Endoscopy;  Laterality: N/A;  . ESOPHAGOGASTRODUODENOSCOPY N/A 04/30/2015   Procedure: ESOPHAGOGASTRODUODENOSCOPY (EGD);  Surgeon: Rogene Houston, MD;  [facility];  Service: Endoscopy;  Laterality: N/A;  1pm - moved to 10/20 @ 1:10  . ESOPHAGOGASTRODUODENOSCOPY N/A 07/29/2016   Procedure: ESOPHAGOGASTRODUODENOSCOPY (EGD);  Surgeon: Manus Gunning, MD;  Location: [place];  Service: Gastroenterology;   Laterality: N/A;  enteroscopy  . ESOPHAGOGASTRODUODENOSCOPY N/A 09/26/2019   Procedure: ESOPHAGOGASTRODUODENOSCOPY (EGD);  Surgeon: Rogene Houston, MD;  [facility];  Service: Endoscopy;  Laterality: N/A;  1250  . GIVENS CAPSULE STUDY N/A 03/07/2019   Procedure: GIVENS CAPSULE STUDY;  Surgeon: Rogene Houston, MD;  [facility];  Service: Endoscopy;  Laterality: N/A;  7:30  . INSERTION OF DIALYSIS CATHETER N/A 10/05/2020   Procedure: ABORTED TUNNELED DIALYSIS CATHETER PLACEMENT RIGHT INTERNAL JUGULAR VEIN ;  Surgeon: Virl Cagey, MD;  Location: [facility];  Service: General;  Laterality: N/A;  . INTRAOPERATIVE TRANSESOPHAGEAL ECHOCARDIOGRAM  06/13/2012   Procedure: INTRAOPERATIVE TRANSESOPHAGEAL ECHOCARDIOGRAM;  Surgeon: Grace Isaac, MD;  Location: [place];  Service: Open Heart Surgery;  Laterality: N/A;  . IR FLUORO GUIDE CV LINE RIGHT  06/17/2020  . IR GENERIC HISTORICAL  07/26/2016   IR FLUORO GUIDE CV LINE RIGHT 07/26/2016 Greggory Keen, MD [facility]INTER[facility]  . IR GENERIC HISTORICAL  07/26/2016   IR US GUIDE VASC ACCESS RIGHT 07/26/2016 Greggory Keen, MD [facility]  . IR GENERIC HISTORICAL  08/02/2016   IR US GUIDE VASC  ACCESS RIGHT 08/02/2016 Greggory Keen, MD [facility]  . IR GENERIC HISTORICAL  08/02/2016   IR FLUORO GUIDE CV LINE RIGHT 08/02/2016 Greggory Keen, MD [facility]  . IR RADIOLOGY PERIPHERAL GUIDED IV START  03/28/2017  . IR REMOVAL TUN CV CATH W/O FL  08/11/2020  . IR THROMBECTOMY AV FISTULA W/THROMBOLYSIS INC/SHUNT/IMG LEFT Left 06/17/2020  . IR US GUIDE VASC ACCESS LEFT  06/17/2020  . IR US GUIDE VASC ACCESS RIGHT  03/28/2017  . IR US GUIDE VASC ACCESS RIGHT  06/17/2020  . LEFT HEART CATH AND CORONARY ANGIOGRAPHY N/A 09/20/2016   Procedure: Left Heart Cath and Coronary Angiography;  Surgeon: [name], MD;  Location: [place] CV LAB;  Service: Cardiovascular;  Laterality: N/A;  . LEFT HEART CATH AND CORS/GRAFTS ANGIOGRAPHY N/A 10/13/2016    Procedure: Left Heart Cath and Cors/Grafts Angiography;  Surgeon: Troy Sine, MD;  Location: [place] [facility];  Service: Cardiovascular;  Laterality: N/A;  . LEFT HEART CATH AND CORS/GRAFTS ANGIOGRAPHY N/A 07/13/2018   Procedure: LEFT HEART CATH AND CORS/GRAFTS ANGIOGRAPHY;  Surgeon: Martinique, Peter M, MD;  Location: [place] CV LAB;  Service: Cardiovascular;  Laterality: N/A;  . LEFT HEART CATHETERIZATION WITH CORONARY ANGIOGRAM N/A 12/15/2011   Procedure: LEFT HEART CATHETERIZATION WITH CORONARY ANGIOGRAM;  Surgeon: Burnell Blanks, MD;  Location: [facility] C[facility] LAB;  Service: Cardiovascular;  Laterality: N/A;  . LEFT HEART CATHETERIZATION WITH CORONARY ANGIOGRAM N/A 01/10/2012   Procedure: LEFT HEART CATHETERIZATION WITH CORONARY ANGIOGRAM;  Surgeon: Peter M Martinique, MD;  Location: [facility] CATH LAB;  Service: Cardiovascular;  Laterality: N/A;  . LEFT HEART CATHETERIZATION WITH CORONARY ANGIOGRAM N/A 06/08/2012   Procedure: LEFT HEART CATHETERIZATION WITH CORONARY ANGIOGRAM;  Surgeon: Burnell Blanks, MD;  Location: [facility] CATH LAB;  Service: Cardiovascular;  Laterality: N/A;  . LEFT HEART CATHETERIZATION WITH CORONARY/GRAFT ANGIOGRAM N/A 12/10/2013   Procedure: LEFT HEART CATHETERIZATION WITH Beatrix Fetters;  Surgeon: [name], MD;  Location: [facility] CATH LAB;  Service: Cardiovascular;  Laterality: N/A;  . OVARY SURGERY     ovarian cancer  . POLYPECTOMY  03/09/2019   Procedure: POLYPECTOMY;  Surgeon: Rogene Houston, MD;  [facility];  Service: Endoscopy;;  cecal   . POLYPECTOMY N/A 09/26/2019   Procedure: DUODENAL POLYPECTOMY;  Surgeon: Rogene Houston, MD;  [facility];  Service: Endoscopy;  Laterality: N/A;  . REVISION OF ARTERIOVENOUS GORETEX GRAFT N/A 02/24/2017   Procedure: REVISION OF ARTERIOVENOUS GORETEX GRAFT (RESECTION);  Surgeon: Katha Cabal, MD;  Location: [facility] ORS;  Service: Vascular;  Laterality: N/A;  . REVISON OF ARTERIOVENOUS FISTULA  Left 06/19/2020   Procedure: REVISION OF LEFT UPPER ARM AV GRAFT WITH INTERPOSITION JUMP GRAFT USING 6MM GORE LIMB;  Surgeon: Marty Heck, MD;  Location: [place];  Service: Vascular;  Laterality: Left;  . SHUNTOGRAM N/A 10/15/2013   Procedure: Fistulogram;  Surgeon: Serafina Mitchell, MD;  Location: [facility] CATH LAB;  Service: Cardiovascular;  Laterality: N/A;  . THROMBECTOMY / ARTERIOVENOUS GRAFT REVISION  2011   left upper arm  . TUBAL LIGATION  1980's  . UPPER EXTREMITY ANGIOGRAPHY Bilateral 12/06/2016   Procedure: Upper Extremity Angiography;  Surgeon: Katha Cabal, MD;  Location: [place] CV LAB;  Service: Cardiovascular;  Laterality: Bilateral;  . UPPER EXTREMITY INTERVENTION Left 06/06/2017   Procedure: UPPER EXTREMITY INTERVENTION;  Surgeon: Katha Cabal, MD;  Location: [place] [facility];  Service: Cardiovascular;  Laterality: Left;    Allergies: Aspirin,  Penicillins, Amlodipine, Bactrim [sulfamethoxazole-trimethoprim], Contrast media [iodinated diagnostic agents], Iron, Nitrofurantoin, Tylenol [acetaminophen], Gabapentin, Hydralazine, Ranexa [ranolazine], Dexilant [dexlansoprazole], Levaquin [levofloxacin in d5w], Morphine and related, Plavix [clopidogrel bisulfate], Protonix [pantoprazole sodium], and Venofer [ferric oxide]  Medications: Prior to Admission medications   Medication Sig Start Date End Date Taking? Authorizing Provider  amiodarone (PACERONE) 200 MG tablet Take 1 tablet (200 mg total) by mouth daily. Starting July 2 12/17/19  Yes Strader, Fransisco Hertz, PA-C  aspirin EC 81 MG tablet Take 81 mg by mouth daily.  09/27/19  Yes Rehman, Mechele Dawley, MD  fluticasone (FLONASE) 50 MCG/ACT nasal spray Place 1 spray into both nostrils at bedtime as needed for allergies.    Yes [provider]  isosorbide mononitrate (IMDUR) 30 MG 24 hr tablet Take 3 tablets (90 mg total) by mouth 2 (two) times daily. 09/29/20  Yes Shahmehdi, Valeria Batman, MD  lidocaine-prilocaine (EMLA)  cream Apply 1 application topically every Monday, Wednesday, and Friday. Prior to dialysis   Yes [provider]  loratadine (CLARITIN) 10 MG tablet Take 1 tablet (10 mg total) by mouth daily as needed for allergies. 07/14/18  Yes Hosie Poisson, MD  multivitamin (RENA-VIT) TABS tablet Take 1 tablet by mouth daily.   Yes [provider]  nitroGLYCERIN (NITROSTAT) 0.4 MG SL tablet Place 1 tablet (0.4 mg total) under the tongue every 5 (five) minutes x 3 doses as needed for chest pain (if no relief after 2nd dose, proceed to the ED for an evaluation or call 911). 09/08/20  Yes Verta Ellen., NP  omeprazole (PRILOSEC) 40 MG capsule Take 1 capsule (40 mg total) by mouth daily before breakfast. 03/31/20  Yes Laurine Blazer B, PA-C  sevelamer carbonate (RENVELA) 800 MG tablet Take 2 tablets (1,600 mg total) by mouth 3 (three) times daily with meals. 06/20/20  Yes Meccariello, Bernita Raisin, DO  simvastatin (ZOCOR) 20 MG tablet Take 1 tablet (20 mg total) by mouth at bedtime. 07/07/20  Yes Verta Ellen., NP     Family History  Problem Relation Age of Onset  . Heart disease Mother        Heart Disease before age 70  . Hyperlipidemia Mother   . Hypertension Mother   . Diabetes Mother   . Heart attack Mother   . Heart disease Father        Heart Disease before age 89  . Hyperlipidemia Father   . Hypertension Father   . Diabetes Father   . Diabetes Sister   . Hypertension Sister   . Diabetes Brother   . Hyperlipidemia Brother   . Heart attack Brother   . Hypertension Sister   . Heart attack Brother   . Colon cancer Child 6  . Other Other        noncontributory for early CAD  . Esophageal cancer Neg Hx   . Liver disease Neg Hx   . Kidney disease Neg Hx   . Colon polyps Neg Hx     Social History   Socioeconomic History  . Marital status: Married    Spouse name: [name]  . Number of children: Not on file  . Years of education: Not on file  . Highest education  level: Not on file  Occupational History  . Not on file  Tobacco Use  . Smoking status: Never Smoker  . Smokeless tobacco: Never Used  Vaping Use  . Vaping Use: Never used  Substance and Sexual Activity  . Alcohol use: No  Alcohol/week: 0.0 standard drinks  . Drug use: No  . Sexual activity: Yes    Birth control/protection: Surgical  Other Topics Concern  . Not on file  Social History Narrative   Lives in [place], [place] with husband.  Dialysis pt - [facility].   Social Determinants of Health   Financial Resource Strain: Not on file  Food Insecurity: Not on file  Transportation Needs: Not on file  Physical Activity: Not on file  Stress: Not on file  Social Connections: Not on file     Review of Systems: A 12 point ROS discussed and pertinent positives are indicated in the HPI above.  All other systems are negative.  Review of Systems  Unable to perform ROS: Mental status change    Vital Signs: BP (!) 154/65 (BP Location: Right Arm)   Pulse 78   Temp 98.7 F (37.1 C) (Oral)   Resp 16   Ht 5\' 1"  (1.549 m)   Wt 136 lb 3.9 oz (61.8 kg)   SpO2 95%   BMI 25.74 kg/m   Physical Exam Vitals and nursing note reviewed.  Constitutional:      General: She is not in acute distress. Cardiovascular:     Rate and Rhythm: Normal rate and regular rhythm.     Heart sounds: Normal heart sounds. No murmur heard.   Pulmonary:     Effort: Pulmonary effort is normal. No respiratory distress.     Breath sounds: Normal breath sounds. No wheezing.  Skin:    General: Skin is warm and dry.     Comments: LUE AV fistula without tenderness, erythema, or drainage; thrill palpable; bruit heard on ascultation.  Neurological:     Mental Status: She is alert.     Comments: Oriented to place but not time.      MD Evaluation Airway: WNL Heart: WNL Abdomen: WNL Chest/ Lungs: WNL ASA  Classification: 3 Mallampati/Airway Score: Two   Imaging: DG Chest Port 1 View  Result Date:  10/05/2020 CLINICAL DATA:  Dialysis catheter placement was attempted but was unsuccessful EXAM: PORTABLE CHEST 1 VIEW COMPARISON:  Portable exam 1624 hours compared to 10/03/2020 FINDINGS: Enlargement of cardiac silhouette with vascular congestion post CABG. Atherosclerotic calcification aorta. Chronic accentuation of perihilar interstitial markings, stable. Subsegmental atelectasis LEFT mid lung. Small bibasilar effusions and atelectasis. No pneumothorax or acute osseous findings. IMPRESSION: Enlargement of cardiac silhouette with pulmonary vascular congestion. Chronic accentuation of markings with bibasilar atelectasis and small effusions. No pneumothorax. Electronically Signed   By: Lavonia Dana M.D.   On: 10/05/2020 17:05   DG Chest Portable 1 View  Result Date: 10/03/2020 CLINICAL DATA:  Shortness of breath for several hours, has not had dialysis treatment. History CHF, hypertension, chronic renal insufficiency, coronary artery disease post MI EXAM: PORTABLE CHEST 1 VIEW COMPARISON:  Portable exam 1455 hours compared to 09/29/2020 FINDINGS: Enlargement of cardiac silhouette post CABG and coronary stenting. Mild pulmonary vascular congestion. Mediastinal contours normal with atherosclerotic calcification aorta noted. Minimal interstitial prominence bilaterally, likely reflecting minimal failure. Subsegmental atelectasis bibasilar greater on LEFT. No pleural effusion or pneumothorax. Bones demineralized. IMPRESSION: Enlargement of cardiac silhouette with pulmonary vascular congestion and question mild pulmonary edema/failure. Post CABG and coronary stenting. Scattered atelectasis greater at LEFT base. Aortic Atherosclerosis (ICD10-I70.0). Electronically Signed   By: Lavonia Dana M.D.   On: 10/03/2020 15:03   DG Chest Portable 1 View  Result Date: 09/29/2020 CLINICAL DATA:  Chest pain for 2 days, worsening, history of CABG, bronchitis, CHF EXAM:  PORTABLE CHEST 1 VIEW COMPARISON:  Radiograph 08/30/2020  FINDINGS: Stable cardiomediastinal contours with evidence of prior sternotomy and CABG. Coronary stents are noted as well. Calcified tortuous aorta is similar to prior. Telemetry leads overlie the chest as well.Left axillary/upper extremity vascular stent partially visualized. Some chronically coarsened interstitial changes and bronchitic features are similar to comparison exam. Additional bandlike areas of scarring and/or atelectasis in the lung bases. No acute superimposed consolidative process is seen. No pneumothorax or layering effusion. Degenerative changes are present in the imaged spine and shoulders. IMPRESSION: 1. Chronic interstitial changes and bronchitic features, unchanged from prior as detailed above. 2. No acute superimposed cardiopulmonary process. 3. Prior sternotomy and CABG.  Stable cardiomegaly. 4.  Aortic Atherosclerosis (ICD10-I70.0). Electronically Signed   By: Lovena Le M.D.   On: 09/29/2020 01:08   [place] EKG SITE RITE  Result Date: 09/29/2020 If [facility] image not attached, placement could not be confirmed due to current cardiac rhythm.   Labs:  CBC: Recent Labs    09/01/20 0557 09/29/20 0112 10/03/20 1225 10/05/20 1219  WBC 8.3 5.6 7.4 7.3  HGB 11.3* 9.8* 10.2* 7.9*  HCT 37.2 33.0* 34.8* 25.8*  PLT 225 210 254 197    COAGS: Recent Labs    06/12/20 1505  INR 1.1    BMP: Recent Labs    10/14/19 1350 11/22/19 0258 12/13/19 1556 02/12/20 1712 05/06/20 1319 09/29/20 0112 10/03/20 1225 10/04/20 0426 10/05/20 1219  NA 141 141 136 136   < > 139 144 139 137  K 4.6 5.5* 3.2* 3.7   < > 4.3 4.5 4.8 5.2*  CL 101 100 98 98   < > 101 101 101 99  CO2 25 28 27 27    < > 26 27 26 25   GLUCOSE 122* 100* 99 94   < > 99 96 82 96  BUN 57* 40* 16 16   < > 27* 56* 35* 60*  CALCIUM 8.6* 8.9 8.0* 7.6*   < > 9.7 10.6* 9.6 9.6  CREATININE 10.84* 8.08* 4.52* 4.84*   < > 5.44* 10.03* 7.48* 11.12*  GFRNONAA 3* 4* 9* 8*   < > 7* 4* 5* 3*  GFRAA 3* 5* 10* 9*  --   --   --    --   --    < > = values in this interval not displayed.    LIVER FUNCTION TESTS: Recent Labs    05/06/20 1319 06/13/20 0352 08/10/20 1600 08/30/20 2308 08/31/20 0630 09/01/20 0557  BILITOT 0.4  --  0.4 0.8 0.7  --   AST 5*  --  15 8* 8*  --   ALT 8  --  17 12 11   --   ALKPHOS 87  --  105 86 85  --   PROT 6.4*  --  7.5 6.6 6.3*  --   ALBUMIN 3.0*   < > 4.0 3.6 3.3* 3.8   < > = values in this interval not displayed.     Assessment and Plan:  ESRD on HD via LUE fistula; with fistula malfunction (clots per notes) s/p right non-tunneled femoral HD catheter placed beside by ED 10/03/2020; in need of long-term HD access. Plan for image-guided fistulagram with possible intervention (with possible tunneled HD catheter placement) today in IR. Patient is NPO. Afebrile.  Risks and benefits discussed with the patient including, but not limited to bleeding, infection, vascular injury, pulmonary embolism, need for tunneled HD catheter placement or even death. All of the  patient's husband's questions were answered, he is agreeable to proceed. Consent obtained by patient's husband, Suraya Vidrine, via telephone- signed and in chart.   Thank you for this interesting consult.  I greatly enjoyed meeting Shawna [name] and look forward to participating in their care.  A copy of this report was sent to the requesting provider on this date.  Electronically Signed: Earley Abide, PA-C 10/06/2020, 1:50 PM   I spent a total of 40 Minutes in face to face in clinical consultation, greater than 50% of which was counseling/coordinating care for ESRD on HD/fistulagram with possible intervention.

## 2020-10-06 NOTE — Procedures (Signed)
Interventional Radiology Procedure Note  Procedure: Left upper extremity fistulagram  Findings: Please refer to procedural dictation for full description.  Patent brachiocephalic anastomosis, puncture zone, venous outflow, and central veins with brisk antegrade flow. Palpable thrill throughout fistula.  No intervention necessary.  Complications: None  Estimated Blood Loss: <5 mL  Recommendations: Fistula OK for use as needed.   Ruthann Cancer, MD Pager: [phone]

## 2020-10-06 NOTE — Progress Notes (Signed)
Alert and oriented x 3 with confusion to time and place that needed re-orientation several times in the shift. PT called 911 thinking she was home and husband missing. NPO after MN for possible procedure. Bed alarm in use. Pt calm and pleasant. Will continue plan of care.

## 2020-10-06 NOTE — Discharge Summary (Addendum)
Physician Discharge Summary  MYESHA STILLION ZOX:[id number] DOB: 1941-01-03 DOA: 10/03/2020  PCP: [facility]  Admit date: 10/03/2020  Discharge date: 10/07/2020  Admitted From:Home  Disposition:  Home  Recommendations for Outpatient Follow-up:  1. Follow up with PCP in 1-2 weeks 2. Continue hemodialysis as arranged 3. Continue home medications as prior to admission  Home Health: None  Equipment/Devices: None  Discharge Condition:Stable  CODE STATUS: Full  Diet recommendation: Renal diet  Brief/Interim Summary:  Angline K Moyeris a 80 y.o.femalewith a history of end-stage renal disease on hemodialysis Mondays, Wednesday, Friday, history of NSTEMI, coronary artery disease, paroxysmal atrial fibrillation rhythm controlled on amiodarone and not on anticoagulation, history of TIA. Patient had recent Covid infection, now cleared and hada shift in her dialysis schedule.  She went to hemodialysis on the day of her admission and apparently was noted to have concern for blood clots at her hemodialysis site.  No other signs of infection were noted and a temporary dialysis catheter was placed to the right femoral region after which point she underwent hemodialysis.  Plan was to change out her hemodialysis catheter in place a PermCath, but this failed on 3/28.  She underwent further hemodialysis that same day through her femoral catheter and was scheduled to undergo fistulogram on 3/29.  She underwent fistulogram at [facility] with IR who determined that her fistula was appropriately functioning and therefore, there was no further intervention was needed.  She has been transported back to [facility] and is stable for discharge to continue her usual hemodialysis as scheduled.  No other acute events noted throughout her inpatient stay.  Pt received HD successfully on 3/30. She was given aranesp injection. The Renal team confirmed and made arrangements for her outpatient HD.  She  will need to have HD again on Saturday 10/10/20.  Pt feels well and stable to discharge home today.    Discharge Diagnoses:  Active Problems:   GERD (gastroesophageal reflux disease)   Chronic diastolic CHF (congestive heart failure) (HCC)   Essential hypertension   ESRD (end stage renal disease) on dialysis (HCC)   Fluid overload   H/O non-ST elevation myocardial infarction (NSTEMI)   Paroxysmal atrial fibrillation (HCC)   History of GI bleed   Failure of surgically constructed arteriovenous fistula (HCC)   Vasovagal episode   Dialysis AV fistula malfunction, initial encounter ([place])   Jugular vein occlusion, right ([place])  Principal discharge diagnosis: Complication of vascular access to left upper extremity fistula-resolved.  Discharge Instructions  Discharge Instructions    Diet - low sodium heart healthy   Complete by: As directed    If the dressing is still on your incision site when you go home, remove it on the third day after your surgery date. Remove dressing if it begins to fall off, or if it is dirty or damaged before the third day.   Complete by: As directed    Increase activity slowly   Complete by: As directed      Allergies as of 10/07/2020      Reactions   Aspirin Other (See Comments)   High Doses Mess up her stomach; "makes my bowels have blood in them". Takes 81 mg EC Aspirin    Penicillins Other (See Comments)   SYNCOPE? , "makes me real weak when I take it; like I'll pass out" Has patient had a PCN reaction causing immediate rash, facial/tongue/throat swelling, SOB or lightheadedness with hypotension: Yes Has patient had a PCN reaction causing  severe rash involving mucus membranes or skin necrosis: no Has patient had a PCN reaction that required hospitalization no Has patient had a PCN reaction occurring within the last 10 years: no If all of the above   Amlodipine Swelling   Bactrim [sulfamethoxazole-trimethoprim] Rash   Contrast Media [iodinated Diagnostic  Agents] Itching   Iron Itching, Other (See Comments)   "they gave me iron in dialysis; had to give me Benadryl cause I had to have the iron" (05/02/2012)   Nitrofurantoin Hives   Tylenol [acetaminophen] Itching, Other (See Comments)   Makes her feet on fire per pt   Gabapentin Other (See Comments)   Unknown reaction   Hydralazine Itching   Ranexa [ranolazine] Other (See Comments)   Myoclonus-hospitalized    Dexilant [dexlansoprazole] Other (See Comments)   Upset stomach   Levaquin [levofloxacin In D5w] Rash   Morphine And Related Itching, Other (See Comments)   Itching in feet   Plavix [clopidogrel Bisulfate] Rash   Protonix [pantoprazole Sodium] Rash   Venofer [ferric Oxide] Itching, Other (See Comments)   Patient reports using Benadryl prior to doses as [place]      Medication List    TAKE these medications   amiodarone 200 MG tablet Commonly known as: PACERONE Take 1 tablet (200 mg total) by mouth daily. Starting July 2   aspirin EC 81 MG tablet Take 81 mg by mouth daily.   fluticasone 50 MCG/ACT nasal spray Commonly known as: FLONASE Place 1 spray into both nostrils at bedtime as needed for allergies.   isosorbide mononitrate 30 MG 24 hr tablet Commonly known as: IMDUR Take 3 tablets (90 mg total) by mouth 2 (two) times daily.   lidocaine-prilocaine cream Commonly known as: EMLA Apply 1 application topically every Monday, Wednesday, and Friday. Prior to dialysis   loratadine 10 MG tablet Commonly known as: CLARITIN Take 1 tablet (10 mg total) by mouth daily as needed for allergies.   multivitamin Tabs tablet Take 1 tablet by mouth daily.   nitroGLYCERIN 0.4 MG SL tablet Commonly known as: NITROSTAT Place 1 tablet (0.4 mg total) under the tongue every 5 (five) minutes x 3 doses as needed for chest pain (if no relief after 2nd dose, proceed to the ED for an evaluation or call 911).   omeprazole 40 MG capsule Commonly known as: PRILOSEC Take 1 capsule  (40 mg total) by mouth daily before breakfast.   sevelamer carbonate 800 MG tablet Commonly known as: RENVELA Take 2 tablets (1,600 mg total) by mouth 3 (three) times daily with meals.   simvastatin 20 MG tablet Commonly known as: ZOCOR Take 1 tablet (20 mg total) by mouth at bedtime.            Discharge Care Instructions  (From admission, onward)         Start     Ordered   10/06/20 0000  If the dressing is still on your incision site when you go home, remove it on the third day after your surgery date. Remove dressing if it begins to fall off, or if it is dirty or damaged before the third day.        10/06/20 1533          Follow-up Information    Go to  [facility].   Specialty: Emergency Medicine Why: If symptoms worsen Contact information: [address] [id number] mc [place] [phone]       Call  [facility].   Why: As needed Contact information: [place] [address] [phone]              Allergies  Allergen Reactions  . Aspirin Other (See Comments)    High Doses Mess up her stomach; "makes my bowels have blood in them". Takes 81 mg EC Aspirin   . Penicillins Other (See Comments)    SYNCOPE? , "makes me real weak when I take it; like I'll pass out"  Has patient had a PCN reaction causing immediate rash, facial/tongue/throat swelling, SOB or lightheadedness with hypotension: Yes Has patient had a PCN reaction causing severe rash involving mucus membranes or skin necrosis: no Has patient had a PCN reaction that required hospitalization no Has patient had a PCN reaction occurring within the last 10 years: no If all of the above  . Amlodipine Swelling  . Bactrim [Sulfamethoxazole-Trimethoprim] Rash  . Contrast Media [Iodinated Diagnostic Agents] Itching  . Iron Itching and Other (See Comments)    "they gave me iron in dialysis; had to give me Benadryl cause I had to  have the iron" (05/02/2012)  . Nitrofurantoin Hives  . Tylenol [Acetaminophen] Itching and Other (See Comments)    Makes her feet on fire per pt  . Gabapentin Other (See Comments)    Unknown reaction  . Hydralazine Itching  . Ranexa [Ranolazine] Other (See Comments)    Myoclonus-hospitalized   . Dexilant [Dexlansoprazole] Other (See Comments)    Upset stomach  . Levaquin [Levofloxacin In D5w] Rash  . Morphine And Related Itching and Other (See Comments)    Itching in feet  . Plavix [Clopidogrel Bisulfate] Rash  . Protonix [Pantoprazole Sodium] Rash  . Venofer [Ferric Oxide] Itching and Other (See Comments)    Patient reports using Benadryl prior to doses as [place]    Consultations:  Nephrology  General surgery  IR   Procedures/Studies: DG Chest Port 1 View  Result Date: 10/05/2020 CLINICAL DATA:  Dialysis catheter placement was attempted but was unsuccessful EXAM: PORTABLE CHEST 1 VIEW COMPARISON:  Portable exam 1624 hours compared to 10/03/2020 FINDINGS: Enlargement of cardiac silhouette with vascular congestion post CABG. Atherosclerotic calcification aorta. Chronic accentuation of perihilar interstitial markings, stable. Subsegmental atelectasis LEFT mid lung. Small bibasilar effusions and atelectasis. No pneumothorax or acute osseous findings. IMPRESSION: Enlargement of cardiac silhouette with pulmonary vascular congestion. Chronic accentuation of markings with bibasilar atelectasis and small effusions. No pneumothorax. Electronically Signed   By: Lavonia Dana M.D.   On: 10/05/2020 17:05   DG Chest Portable 1 View  Result Date: 10/03/2020 CLINICAL DATA:  Shortness of breath for several hours, has not had dialysis treatment. History CHF, hypertension, chronic renal insufficiency, coronary artery disease post MI EXAM: PORTABLE CHEST 1 VIEW COMPARISON:  Portable exam 1455 hours compared to 09/29/2020 FINDINGS: Enlargement of cardiac silhouette post CABG and coronary  stenting. Mild pulmonary vascular congestion. Mediastinal contours normal with atherosclerotic calcification aorta noted. Minimal interstitial prominence bilaterally, likely reflecting minimal failure. Subsegmental atelectasis bibasilar greater on LEFT. No pleural effusion or pneumothorax. Bones demineralized. IMPRESSION: Enlargement of cardiac silhouette with pulmonary vascular congestion and question mild pulmonary edema/failure. Post CABG and coronary stenting. Scattered atelectasis greater at LEFT base. Aortic Atherosclerosis (ICD10-I70.0). Electronically Signed   By: Lavonia Dana M.D.   On: 10/03/2020 15:03   DG Chest Portable 1 View  Result Date: 09/29/2020 CLINICAL DATA:  Chest pain for 2 days, worsening, history of CABG, bronchitis, CHF EXAM: PORTABLE CHEST 1 VIEW  COMPARISON:  Radiograph 08/30/2020 FINDINGS: Stable cardiomediastinal contours with evidence of prior sternotomy and CABG. Coronary stents are noted as well. Calcified tortuous aorta is similar to prior. Telemetry leads overlie the chest as well.Left axillary/upper extremity vascular stent partially visualized. Some chronically coarsened interstitial changes and bronchitic features are similar to comparison exam. Additional bandlike areas of scarring and/or atelectasis in the lung bases. No acute superimposed consolidative process is seen. No pneumothorax or layering effusion. Degenerative changes are present in the imaged spine and shoulders. IMPRESSION: 1. Chronic interstitial changes and bronchitic features, unchanged from prior as detailed above. 2. No acute superimposed cardiopulmonary process. 3. Prior sternotomy and CABG.  Stable cardiomegaly. 4.  Aortic Atherosclerosis (ICD10-I70.0). Electronically Signed   By: Lovena Le M.D.   On: 09/29/2020 01:08   [place] EKG SITE RITE  Result Date: 09/29/2020 If [facility] image not attached, placement could not be confirmed due to current cardiac rhythm.    Discharge Exam: Vitals:   10/07/20  1330 10/07/20 1400  BP: (!) 119/53 (!) 116/51  Pulse: 77 73  Resp:    Temp:    SpO2:     Vitals:   10/07/20 1230 10/07/20 1300 10/07/20 1330 10/07/20 1400  BP: (!) 116/53 (!) 107/44 (!) 119/53 (!) 116/51  Pulse: 74 75 77 73  Resp:      Temp:      TempSrc:      SpO2:      Weight:      Height:        General: Pt is alert, awake, not in acute distress Cardiovascular: RRR, S1/S2 +, no rubs, no gallops Respiratory: CTA bilaterally, no wheezing, no rhonchi Abdominal: Soft, NT, ND, bowel sounds + Extremities: no edema, no cyanosis, left upper extremity fistula    The results of significant diagnostics from this hospitalization (including imaging, microbiology, ancillary and laboratory) are listed below for reference.     Microbiology: Recent Results (from the past 240 hour(s))  Resp Panel by RT-PCR (Flu A&B, Covid) Nasopharyngeal Swab     Status: Abnormal   Collection Time: 09/29/20  3:21 AM   Specimen: Nasopharyngeal Swab; Nasopharyngeal(NP) swabs in vial transport medium  Result Value Ref Range Status   SARS Coronavirus 2 by RT PCR POSITIVE (A) NEGATIVE Final    Comment: RESULT CALLED TO, READ BACK BY AND VERIFIED WITH: K NICHOLS,RN@0511  09/29/20 MKELLY (NOTE) SARS-CoV-2 target nucleic acids are DETECTED.  The SARS-CoV-2 RNA is generally detectable in upper respiratory specimens during the acute phase of infection. Positive results are indicative of the presence of the identified virus, but do not rule out bacterial infection or co-infection with other pathogens not detected by the test. Clinical correlation with patient history and other diagnostic information is necessary to determine patient infection status. The expected result is Negative.  Fact Sheet for Patients: EntrepreneurPulse.com.au  Fact Sheet for Healthcare Providers: IncredibleEmployment.be  This test is not yet approved or cleared by the [place] FDA and  has  been authorized for detection and/or diagnosis of SARS-CoV-2 by FDA under an Emergency Use Authorization (EUA).  This EUA will remain in effect (meaning this test can be  used) for the duration of  the COVID-19 declaration under Section 564(b)(1) of the Act, 21 U.S.C. section 360bbb-3(b)(1), unless the authorization is terminated or revoked sooner.     Influenza A by PCR NEGATIVE NEGATIVE Final   Influenza B by PCR NEGATIVE NEGATIVE Final    Comment: (NOTE) The Xpert Xpress SARS-CoV-2/FLU/RSV plus assay is intended  as an aid in the diagnosis of influenza from Nasopharyngeal swab specimens and should not be used as a sole basis for treatment. Nasal washings and aspirates are unacceptable for Xpert Xpress SARS-CoV-2/FLU/RSV testing.  Fact Sheet for Patients: EntrepreneurPulse.com.au  Fact Sheet for Healthcare Providers: IncredibleEmployment.be  This test is not yet approved or cleared by the [place] FDA and has been authorized for detection and/or diagnosis of SARS-CoV-2 by FDA under an Emergency Use Authorization (EUA). This EUA will remain in effect (meaning this test can be used) for the duration of the COVID-19 declaration under Section 564(b)(1) of the Act, 21 U.S.C. section 360bbb-3(b)(1), unless the authorization is terminated or revoked.  Performed at [facility], [address] [place], [place] [address]   Resp Panel by RT-PCR (Flu A&B, Covid) Nasopharyngeal Swab     Status: None   Collection Time: 10/03/20  6:05 PM   Specimen: Nasopharyngeal Swab; Nasopharyngeal(NP) swabs in vial transport medium  Result Value Ref Range Status   SARS Coronavirus 2 by RT PCR NEGATIVE NEGATIVE Final    Comment: (NOTE) SARS-CoV-2 target nucleic acids are NOT DETECTED.  The SARS-CoV-2 RNA is generally detectable in upper respiratory specimens during the acute phase of infection. The lowest concentration of SARS-CoV-2 viral copies this assay can  detect is 138 copies/mL. A negative result does not preclude SARS-Cov-2 infection and should not be used as the sole basis for treatment or other patient management decisions. A negative result may occur with  improper specimen collection/handling, submission of specimen other than nasopharyngeal swab, presence of viral mutation(s) within the areas targeted by this assay, and inadequate number of viral copies(<138 copies/mL). A negative result must be combined with clinical observations, patient history, and epidemiological information. The expected result is Negative.  Fact Sheet for Patients:  EntrepreneurPulse.com.au  Fact Sheet for Healthcare Providers:  IncredibleEmployment.be  This test is no t yet approved or cleared by the [place] FDA and  has been authorized for detection and/or diagnosis of SARS-CoV-2 by FDA under an Emergency Use Authorization (EUA). This EUA will remain  in effect (meaning this test can be used) for the duration of the COVID-19 declaration under Section 564(b)(1) of the Act, 21 U.S.C.section 360bbb-3(b)(1), unless the authorization is terminated  or revoked sooner.       Influenza A by PCR NEGATIVE NEGATIVE Final   Influenza B by PCR NEGATIVE NEGATIVE Final    Comment: (NOTE) The Xpert Xpress SARS-CoV-2/FLU/RSV plus assay is intended as an aid in the diagnosis of influenza from Nasopharyngeal swab specimens and should not be used as a sole basis for treatment. Nasal washings and aspirates are unacceptable for Xpert Xpress SARS-CoV-2/FLU/RSV testing.  Fact Sheet for Patients: EntrepreneurPulse.com.au  Fact Sheet for Healthcare Providers: IncredibleEmployment.be  This test is not yet approved or cleared by the [place] FDA and has been authorized for detection and/or diagnosis of SARS-CoV-2 by FDA under an Emergency Use Authorization (EUA). This EUA will remain in  effect (meaning this test can be used) for the duration of the COVID-19 declaration under Section 564(b)(1) of the Act, 21 U.S.C. section 360bbb-3(b)(1), unless the authorization is terminated or revoked.  Performed at [facility], [address] [place], [place] [address]   MRSA PCR Screening     Status: None   Collection Time: 10/03/20 11:00 PM   Specimen: Nasopharyngeal  Result Value Ref Range Status   MRSA by PCR NEGATIVE NEGATIVE Final    Comment:        The  GeneXpert MRSA Assay (FDA approved for NASAL specimens only), is one component of a comprehensive MRSA colonization surveillance program. It is not intended to diagnose MRSA infection nor to guide or monitor treatment for MRSA infections. Performed at [facility], [address], [place], [place] [address]      Labs: BNP (last 3 results) Recent Labs    09/29/20 0112  BNP 482.7*   Basic Metabolic Panel: Recent Labs  Lab 10/03/20 1225 10/04/20 0426 10/05/20 1219 10/07/20 1149  NA 144 139 137 133*  K 4.5 4.8 5.2* 4.3  CL 101 101 99 96*  CO2 27 26 25 23   GLUCOSE 96 82 96 135*  BUN 56* 35* 60* 57*  CREATININE 10.03* 7.48* 11.12* 9.73*  CALCIUM 10.6* 9.6 9.6 10.0  MG  --   --  1.9  --   PHOS  --   --   --  6.6*   Liver Function Tests: Recent Labs  Lab 10/07/20 1149  ALBUMIN 3.0*   No results for input(s): LIPASE, AMYLASE in the last 168 hours. No results for input(s): AMMONIA in the last 168 hours. CBC: Recent Labs  Lab 10/03/20 1225 10/05/20 1219 10/07/20 1149  WBC 7.4 7.3 10.4  NEUTROABS 4.8  --   --   HGB 10.2* 7.9* 7.7*  HCT 34.8* 25.8* 25.0*  MCV 123.4* 121.1* 116.3*  PLT 254 197 238   Cardiac Enzymes: No results for input(s): CKTOTAL, CKMB, CKMBINDEX, TROPONINI in the last 168 hours. BNP: Invalid input(s): POCBNP CBG: No results for input(s): GLUCAP in the last 168 hours. D-Dimer No results for input(s): DDIMER in the last 72 hours. Hgb A1c No results for input(s): HGBA1C in  the last 72 hours. Lipid Profile No results for input(s): CHOL, HDL, LDLCALC, TRIG, CHOLHDL, LDLDIRECT in the last 72 hours. Thyroid function studies No results for input(s): TSH, T4TOTAL, T3FREE, THYROIDAB in the last 72 hours.  Invalid input(s): FREET3 Anemia work up No results for input(s): VITAMINB12, FOLATE, FERRITIN, TIBC, IRON, RETICCTPCT in the last 72 hours. Urinalysis    Component Value Date/Time   COLORURINE YELLOW 12/16/2012 1919   APPEARANCEUR CLOUDY (A) 12/16/2012 1919   LABSPEC 1.009 12/16/2012 1919   PHURINE 7.5 12/16/2012 1919   GLUCOSEU NEGATIVE 12/16/2012 1919   HGBUR TRACE (A) 12/16/2012 1919   BILIRUBINUR NEGATIVE 12/16/2012 1919   KETONESUR NEGATIVE 12/16/2012 1919   PROTEINUR 100 (A) 12/16/2012 1919   UROBILINOGEN 0.2 12/16/2012 1919   NITRITE NEGATIVE 12/16/2012 1919   LEUKOCYTESUR SMALL (A) 12/16/2012 1919   Sepsis Labs Invalid input(s): PROCALCITONIN,  WBC,  LACTICIDVEN Microbiology Recent Results (from the past 240 hour(s))  Resp Panel by RT-PCR (Flu A&B, Covid) Nasopharyngeal Swab     Status: Abnormal   Collection Time: 09/29/20  3:21 AM   Specimen: Nasopharyngeal Swab; Nasopharyngeal(NP) swabs in vial transport medium  Result Value Ref Range Status   SARS Coronavirus 2 by RT PCR POSITIVE (A) NEGATIVE Final    Comment: RESULT CALLED TO, READ BACK BY AND VERIFIED WITH: K NICHOLS,RN@0511  09/29/20 MKELLY (NOTE) SARS-CoV-2 target nucleic acids are DETECTED.  The SARS-CoV-2 RNA is generally detectable in upper respiratory specimens during the acute phase of infection. Positive results are indicative of the presence of the identified virus, but do not rule out bacterial infection or co-infection with other pathogens not detected by the test. Clinical correlation with patient history and other diagnostic information is necessary to determine patient infection status. The expected result is Negative.  Fact Sheet for  Patients:  EntrepreneurPulse.com.au  Fact Sheet for Healthcare Providers: IncredibleEmployment.be  This test is not yet approved or cleared by the [place] FDA and  has been authorized for detection and/or diagnosis of SARS-CoV-2 by FDA under an Emergency Use Authorization (EUA).  This EUA will remain in effect (meaning this test can be  used) for the duration of  the COVID-19 declaration under Section 564(b)(1) of the Act, 21 U.S.C. section 360bbb-3(b)(1), unless the authorization is terminated or revoked sooner.     Influenza A by PCR NEGATIVE NEGATIVE Final   Influenza B by PCR NEGATIVE NEGATIVE Final    Comment: (NOTE) The Xpert Xpress SARS-CoV-2/FLU/RSV plus assay is intended as an aid in the diagnosis of influenza from Nasopharyngeal swab specimens and should not be used as a sole basis for treatment. Nasal washings and aspirates are unacceptable for Xpert Xpress SARS-CoV-2/FLU/RSV testing.  Fact Sheet for Patients: EntrepreneurPulse.com.au  Fact Sheet for Healthcare Providers: IncredibleEmployment.be  This test is not yet approved or cleared by the [place] FDA and has been authorized for detection and/or diagnosis of SARS-CoV-2 by FDA under an Emergency Use Authorization (EUA). This EUA will remain in effect (meaning this test can be used) for the duration of the COVID-19 declaration under Section 564(b)(1) of the Act, 21 U.S.C. section 360bbb-3(b)(1), unless the authorization is terminated or revoked.  Performed at [facility], [address], [place], [place] [address]   Resp Panel by RT-PCR (Flu A&B, Covid) Nasopharyngeal Swab     Status: None   Collection Time: 10/03/20  6:05 PM   Specimen: Nasopharyngeal Swab; Nasopharyngeal(NP) swabs in vial transport medium  Result Value Ref Range Status   SARS Coronavirus 2 by RT PCR NEGATIVE NEGATIVE Final    Comment: (NOTE) SARS-CoV-2 target  nucleic acids are NOT DETECTED.  The SARS-CoV-2 RNA is generally detectable in upper respiratory specimens during the acute phase of infection. The lowest concentration of SARS-CoV-2 viral copies this assay can detect is 138 copies/mL. A negative result does not preclude SARS-Cov-2 infection and should not be used as the sole basis for treatment or other patient management decisions. A negative result may occur with  improper specimen collection/handling, submission of specimen other than nasopharyngeal swab, presence of viral mutation(s) within the areas targeted by this assay, and inadequate number of viral copies(<138 copies/mL). A negative result must be combined with clinical observations, patient history, and epidemiological information. The expected result is Negative.  Fact Sheet for Patients:  EntrepreneurPulse.com.au  Fact Sheet for Healthcare Providers:  IncredibleEmployment.be  This test is no t yet approved or cleared by the [place] FDA and  has been authorized for detection and/or diagnosis of SARS-CoV-2 by FDA under an Emergency Use Authorization (EUA). This EUA will remain  in effect (meaning this test can be used) for the duration of the COVID-19 declaration under Section 564(b)(1) of the Act, 21 U.S.C.section 360bbb-3(b)(1), unless the authorization is terminated  or revoked sooner.       Influenza A by PCR NEGATIVE NEGATIVE Final   Influenza B by PCR NEGATIVE NEGATIVE Final    Comment: (NOTE) The Xpert Xpress SARS-CoV-2/FLU/RSV plus assay is intended as an aid in the diagnosis of influenza from Nasopharyngeal swab specimens and should not be used as a sole basis for treatment. Nasal washings and aspirates are unacceptable for Xpert Xpress SARS-CoV-2/FLU/RSV testing.  Fact Sheet for Patients: EntrepreneurPulse.com.au  Fact Sheet for Healthcare  Providers: IncredibleEmployment.be  This test is not yet approved or cleared by the [place] FDA  and has been authorized for detection and/or diagnosis of SARS-CoV-2 by FDA under an Emergency Use Authorization (EUA). This EUA will remain in effect (meaning this test can be used) for the duration of the COVID-19 declaration under Section 564(b)(1) of the Act, 21 U.S.C. section 360bbb-3(b)(1), unless the authorization is terminated or revoked.  Performed at [facility], [address], [place], [place] [address]   MRSA PCR Screening     Status: None   Collection Time: 10/03/20 11:00 PM   Specimen: Nasopharyngeal  Result Value Ref Range Status   MRSA by PCR NEGATIVE NEGATIVE Final    Comment:        The GeneXpert MRSA Assay (FDA approved for NASAL specimens only), is one component of a comprehensive MRSA colonization surveillance program. It is not intended to diagnose MRSA infection nor to guide or monitor treatment for MRSA infections. Performed at [facility], [address] [place], [place] [address]     Time coordinating discharge: 35 minutes  SIGNED:   Irwin Brakeman, MD [facility] 10/07/2020, 3:31 PM  If 7PM-7AM, please contact night-coverage [email or web address]

## 2020-10-06 NOTE — Sedation Documentation (Signed)
No intervention needed following assessment by radiologist.

## 2020-10-07 LAB — RENAL FUNCTION PANEL
Albumin: 3 g/dL — ABNORMAL LOW (ref 3.5–5.0)
Anion gap: 14 (ref 5–15)
BUN: 57 mg/dL — ABNORMAL HIGH (ref 8–23)
CO2: 23 mmol/L (ref 22–32)
Calcium: 10 mg/dL (ref 8.9–10.3)
Chloride: 96 mmol/L — ABNORMAL LOW (ref 98–111)
Creatinine, Ser: 9.73 mg/dL — ABNORMAL HIGH (ref 0.44–1.00)
GFR, Estimated: 4 mL/min — ABNORMAL LOW (ref >60)
Glucose, Bld: 135 mg/dL — ABNORMAL HIGH (ref 70–99)
Phosphorus: 6.6 mg/dL — ABNORMAL HIGH (ref 2.5–4.6)
Potassium: 4.3 mmol/L (ref 3.5–5.1)
Sodium: 133 mmol/L — ABNORMAL LOW (ref 135–145)

## 2020-10-07 LAB — CBC
HCT: 25 % — ABNORMAL LOW (ref 36.0–46.0)
Hemoglobin: 7.7 g/dL — ABNORMAL LOW (ref 12.0–15.0)
MCH: 35.8 pg — ABNORMAL HIGH (ref 26.0–34.0)
MCHC: 30.8 g/dL (ref 30.0–36.0)
MCV: 116.3 fL — ABNORMAL HIGH (ref 80.0–100.0)
Platelets: 238 10*3/uL (ref 150–400)
RBC: 2.15 MIL/uL — ABNORMAL LOW (ref 3.87–5.11)
RDW: 17.6 % — ABNORMAL HIGH (ref 11.5–15.5)
WBC: 10.4 10*3/uL (ref 4.0–10.5)
nRBC: 0 % (ref 0.0–0.2)

## 2020-10-07 MED ORDER — DARBEPOETIN ALFA 40 MCG/0.4ML IJ SOSY
40.0000 ug | PREFILLED_SYRINGE | Freq: Once | INTRAMUSCULAR | Status: AC
  Administered 2020-10-07: 40 ug via INTRAVENOUS
  Filled 2020-10-07: qty 0.4

## 2020-10-07 NOTE — Progress Notes (Signed)
Patient ID: Shawna [name], female   DOB: 18-Dec-1940, 80 y.o.   MRN: [id number] S: Pt reports "I forgot my home phone number".  O:BP (!) 142/50 (BP Location: Right Arm)   Pulse 74   Temp 98 F (36.7 C) (Oral)   Resp 14   Ht 5\' 1"  (1.549 m)   Wt 61.8 kg   SpO2 97%   BMI 25.74 kg/m   Intake/Output Summary (Last 24 hours) at 10/07/2020 1016 Last data filed at 10/07/2020 0900 Gross per 24 hour  Intake 240 ml  Output --  Net 240 ml   Intake/Output: I/O last 3 completed shifts: In: -  Out: 1132 [Other:1132]  Intake/Output this shift:  Total I/O In: 240 [P.O.:240] Out: -  Weight change:  Gen: NAD CVS: RRR Resp:cta Abd: +BS, soft, Nt/Nd Ext: no edema, LUE AVF +T/B  Recent Labs  Lab 10/03/20 1225 10/04/20 0426 10/05/20 1219  NA 144 139 137  K 4.5 4.8 5.2*  CL 101 101 99  CO2 27 26 25   GLUCOSE 96 82 96  BUN 56* 35* 60*  CREATININE 10.03* 7.48* 11.12*  CALCIUM 10.6* 9.6 9.6   Liver Function Tests: No results for input(s): AST, ALT, ALKPHOS, BILITOT, PROT, ALBUMIN in the last 168 hours. No results for input(s): LIPASE, AMYLASE in the last 168 hours. No results for input(s): AMMONIA in the last 168 hours. CBC: Recent Labs  Lab 10/03/20 1225 10/05/20 1219  WBC 7.4 7.3  NEUTROABS 4.8  --   HGB 10.2* 7.9*  HCT 34.8* 25.8*  MCV 123.4* 121.1*  PLT 254 197   Cardiac Enzymes: No results for input(s): CKTOTAL, CKMB, CKMBINDEX, TROPONINI in the last 168 hours. CBG: No results for input(s): GLUCAP in the last 168 hours.  Iron Studies: No results for input(s): IRON, TIBC, TRANSFERRIN, FERRITIN in the last 72 hours. Studies/Results: DG Chest Port 1 View  Result Date: 10/05/2020 CLINICAL DATA:  Dialysis catheter placement was attempted but was unsuccessful EXAM: PORTABLE CHEST 1 VIEW COMPARISON:  Portable exam 1624 hours compared to 10/03/2020 FINDINGS: Enlargement of cardiac silhouette with vascular congestion post CABG. Atherosclerotic calcification aorta. Chronic  accentuation of perihilar interstitial markings, stable. Subsegmental atelectasis LEFT mid lung. Small bibasilar effusions and atelectasis. No pneumothorax or acute osseous findings. IMPRESSION: Enlargement of cardiac silhouette with pulmonary vascular congestion. Chronic accentuation of markings with bibasilar atelectasis and small effusions. No pneumothorax. Electronically Signed   By: Lavonia Dana M.D.   On: 10/05/2020 17:05   . amiodarone  200 mg Oral Daily  . aspirin EC  81 mg Oral Daily  . Chlorhexidine Gluconate Cloth  6 each Topical Q0600  . isosorbide mononitrate  90 mg Oral BID  . mouth rinse  15 mL Mouth Rinse BID  . sevelamer carbonate  1,600 mg Oral TID WC  . simvastatin  20 mg Oral QHS    BMET    Component Value Date/Time   NA 137 10/05/2020 1219   K 5.2 (H) 10/05/2020 1219   CL 99 10/05/2020 1219   CO2 25 10/05/2020 1219   GLUCOSE 96 10/05/2020 1219   BUN 60 (H) 10/05/2020 1219   CREATININE 11.12 (H) 10/05/2020 1219   CREATININE 6.57 (H) 03/05/2019 1159   CALCIUM 9.6 10/05/2020 1219   CALCIUM 8.1 (L) 05/29/2013 1814   GFRNONAA 3 (L) 10/05/2020 1219   GFRNONAA 6 (L) 03/05/2019 1159   GFRAA 9 (L) 02/12/2020 1712   GFRAA 6 (L) 03/05/2019 1159   CBC    Component Value  Date/Time   WBC 7.3 10/05/2020 1219   RBC 2.13 (L) 10/05/2020 1219   HGB 7.9 (L) 10/05/2020 1219   HCT 25.8 (L) 10/05/2020 1219   PLT 197 10/05/2020 1219   MCV 121.1 (H) 10/05/2020 1219   MCH 37.1 (H) 10/05/2020 1219   MCHC 30.6 10/05/2020 1219   RDW 18.6 (H) 10/05/2020 1219   LYMPHSABS 0.7 10/03/2020 1225   MONOABS 0.9 10/03/2020 1225   EOSABS 0.8 (H) 10/03/2020 1225   BASOSABS 0.1 10/03/2020 1225     Dialysis Orders: [facility]. EDW61kgHD Bath 2K/2.5CaTime 3.5 hrsHeparin none. AccessLUE AVFBFR 300DFR 600  Hectoral 2.33mcg IV/HD Epogen 8000Units IV/HD   Assessment/Plan: 1. Complication of vascular access - unable to successfully place [facility] 3/28 and  underwent fistulogram 3/29 which was "normal" but did not have TDC placed for unclear reasons.  Will attempt cannulation of LUE AVF, however if not successful with need to transfer to [facility] for [facility] placement before discharge.   1. Will d/c temp HD catheter prior to discharge.  2. ESRD- was on MWF schedule until she was covid + and is now on TTS at covid unit.  She is to resume MWF schedule next week.  Off schedule for now due to procedures.   3. Hypertension/volume- improved with HD yesterday.  Off of oxygen.  4. Anemia- stable 5. Metabolic bone disease- Continue with home meds 6. Nutrition- renal diet 7. Disposition - hopeful discharge to home after placement of TDC.   Donetta Potts, MD [employer] [phone]

## 2020-10-07 NOTE — Procedures (Signed)
   HEMODIALYSIS TREATMENT NOTE:  Uneventful 3.5 hour heparin-free HD completed via RUE AVF (15g/antegrade).   Right femoral temporary HD cath was removed by Dr. Constance Haw and direct pressure was held for 20 minutes.  Goal met: 1.1 liters removed.  All blood was returned and hemostasis was achieved in 20 minutes.  No changes from pre-HD assessment.  I spoke with Ronny Bacon at [facility] about pt's inpatient HD schedule without airborne precautions in light of Covid negative test on 10/03/20.    Rockwell Alexandria, RN

## 2020-10-07 NOTE — Plan of Care (Signed)
Alert and oriented x 4. Forgetful at times. Gets confused and thinks she is home but is easily re-oriented. Denies pain or discomfort  at this time. Needs 1 person assist with alds. Bed alarm in use, call bell within reach. Continue plan of care.  Problem: Education: Goal: Knowledge of General Education information will improve Description: Including pain rating scale, medication(s)/side effects and non-pharmacologic comfort measures Outcome: Progressing   Problem: Health Behavior/Discharge Planning: Goal: Ability to manage health-related needs will improve Outcome: Progressing   Problem: Clinical Measurements: Goal: Ability to maintain clinical measurements within normal limits will improve Outcome: Progressing Goal: Will remain free from infection Outcome: Progressing Goal: Diagnostic test results will improve Outcome: Progressing Goal: Respiratory complications will improve Outcome: Progressing Goal: Cardiovascular complication will be avoided Outcome: Progressing   Problem: Activity: Goal: Risk for activity intolerance will decrease Outcome: Progressing   Problem: Nutrition: Goal: Adequate nutrition will be maintained Outcome: Progressing   Problem: Coping: Goal: Level of anxiety will decrease Outcome: Progressing   Problem: Elimination: Goal: Will not experience complications related to bowel motility Outcome: Progressing Goal: Will not experience complications related to urinary retention Outcome: Progressing   Problem: Pain Managment: Goal: General experience of comfort will improve Outcome: Progressing   Problem: Safety: Goal: Ability to remain free from injury will improve Outcome: Progressing   Problem: Skin Integrity: Goal: Risk for impaired skin integrity will decrease Outcome: Progressing

## 2020-10-07 NOTE — Progress Notes (Signed)
10/07/2020 3:30 PM  Pt stable to DC home after HD completed today.  Discussed with nephrology team.  See DC summary.   [name] MD  How to contact the [facility] Attending or Consulting provider [place] or covering provider during after hours [place], for this patient?  1. Check the care team in [facility] and look for a) attending/consulting TRH provider listed and b) the [facility] team listed 2. Log into [email or web address] and use [place]'s universal password to access. If you do not have the password, please contact the hospital operator. 3. Locate the [facility] provider you are looking for under [facility] and page to a number that you can be directly reached. 4. If you still have difficulty reaching the provider, please page the [facility] (Director on Call) for the Hospitalists listed on amion for assistance.

## 2020-10-07 NOTE — Progress Notes (Signed)
[facility]  Femoral line removed. Pressure held by dialysis RN.  Curlene Labrum, MD

## 2020-10-15 ENCOUNTER — Ambulatory Visit (INDEPENDENT_AMBULATORY_CARE_PROVIDER_SITE_OTHER): Payer: Medicare HMO | Admitting: Nurse Practitioner

## 2020-10-15 ENCOUNTER — Encounter (INDEPENDENT_AMBULATORY_CARE_PROVIDER_SITE_OTHER): Payer: Medicare HMO

## 2020-10-19 NOTE — Progress Notes (Addendum)
Cardiology Office Note    Date:  10/20/2020   ID:  Shawna [name], DOB 02/14/1941, MRN [id number]  PCP:  [facility]  Cardiologist: Previously Dr. Bronson Ing --> Has scheduled follow-up with Dr. Harl Bowie next month  Chief Complaint  Patient presents with  . Hospitalization Follow-up    History of Present Illness:    Shawna [name] is a 80 y.o. female with past medical history of CAD (s/p CABG in 2013,DES to D1 in 10/2016 with cath showing patent LIMA-LAD, SVG-RI, SVG-Mrg and SVG-RCA, cath in 07/2018 showingpatentgrafts with occlusion of D1 at prior stent site and progression of PDA disease --> medical management recommended),chronic diastolic CHF, HTN, HLD, paroxysmal atrial fibrillation(not on anticoagulation given history of GIB),NSVT (on Amiodarone)and ESRDwho presents to the office today for hospital follow-up.   She was admitted to [facility] on 09/29/2020 for evaluation of chest pain which was a shooting pain and lasting for seconds at a time. Hs Troponin values were flat at 43 and 45 with EKG showing no acute ST changes. She was COVID positive on admission but denied any respiratory issues. Continued medical therapy was recommended and Imdur was titrated from 60mg  BID to 90mg  BID. Was continued on ASA and statin therapy as she previously did not tolerate Ranexa and had hypotension with BB therapy in the past.   She was again admitted from 3/26 - 10/07/2020 due to issues with her fistula and required fistulagram which sowed her fistula was working appropriately.   In talking with the patient today, she reports overall doing well since her recent admission. She does continue to experience occasional episodes of chest pain but these improve with SL NTG x1 and she has not had to use this recently. Reports breathing has been stable and denies any recent orthopnea or PND. She does experience occasional lower extremity edema on non-HD days and tries to monitor her fluid intake.     Past Medical History:  Diagnosis Date  . Acute on chronic respiratory failure with hypoxia ([place]) 10/10/2016  . Anxiety   . Arthritis   . AVM (arteriovenous malformation) of colon   . CAD (coronary artery disease)    a. s/p CABG in 2013 b. DES to D1 in 10/2016. c. cath in 07/2018 showing patent grafts with occlusion of D1 at prior stent site and progression of PDA disease --> medical management recommended  . Carotid artery disease ([place])    a. 41-66% LICA, 0/6301   . Chronic anemia   . Chronic bronchitis ([place])   . Chronic diastolic CHF (congestive heart failure) ([place])    a. 02/2012 Echo EF 60-65%, nl wall motion, Gr 1 DD, mod MR  . Colon cancer ([place]) 1992  . Esophageal stricture   . ESRD on hemodialysis ([place])    ESRD due to HTN, started dialysis 2011 and gets HD at [facility] with Dr Shawna [name] on MWF schedule.  Access is LUA AVF as of Sept 2014.   Marland Kitchen GERD (gastroesophageal reflux disease)   . High cholesterol 12/2011  . History of blood transfusion 07/2011; 12/2011; 01/2012 X 2; 04/2012  . History of gout   . History of lower GI bleeding   . Hypertension   . Iron deficiency anemia   . Mitral regurgitation    a. Moderate by echo, 02/2012  . Myocardial infarction ([place])   . NSVT (nonsustained ventricular tachycardia) ([place])   . Ovarian cancer ([place]) 1992  . PAF (paroxysmal atrial fibrillation) ([place])   . Pneumonia ~  2009  . PUD (peptic ulcer disease)   . TIA (transient ischemic attack)     Past Surgical History:  Procedure Laterality Date  . A/V SHUNTOGRAM Left 03/19/2019   Procedure: A/V SHUNTOGRAM;  Surgeon: Katha Cabal, MD;  Location: [address] CV LAB;  Service: Cardiovascular;  Laterality: Left;  . ABDOMINAL HYSTERECTOMY  1992  . APPENDECTOMY  06/1990  . AV FISTULA PLACEMENT  07/2009   left upper arm  . AV FISTULA PLACEMENT Right 09/06/2016   Procedure: RIGHT FOREARM ARTERIOVENOUS (AV) GRAFT;  Surgeon: Elam Dutch, MD;  Location: [facility] OR;  Service: Vascular;   Laterality: Right;  . AV FISTULA PLACEMENT N/A 02/24/2017   Procedure: INSERTION OF ARTERIOVENOUS (AV) GORE-TEX GRAFT ARM (BRACHIAL AXILLARY);  Surgeon: Katha Cabal, MD;  Location: [facility] ORS;  Service: Vascular;  Laterality: N/A;  . [place] Right 09/06/2016   Procedure: REMOVAL OF Right Arm ARTERIOVENOUS GORETEX GRAFT and Vein Patch angioplasty of brachial artery;  Surgeon: Angelia Mould, MD;  Location: [place];  Service: Vascular;  Laterality: Right;  . BIOPSY  09/26/2019   Procedure: BIOPSY;  Surgeon: Rogene Houston, MD;  [facility];  Service: Endoscopy;;  . COLON RESECTION  1992  . COLON SURGERY    . COLONOSCOPY N/A 03/09/2019   Procedure: COLONOSCOPY;  Surgeon: Rogene Houston, MD;  [facility];  Service: Endoscopy;  Laterality: N/A;  . CORONARY ANGIOPLASTY WITH STENT PLACEMENT  12/15/11   "2"  . CORONARY ANGIOPLASTY WITH STENT PLACEMENT  y/2013   "1; makes total of 3" (05/02/2012)  . CORONARY ARTERY BYPASS GRAFT  06/13/2012   Procedure: CORONARY ARTERY BYPASS GRAFTING (CABG);  Surgeon: Grace Isaac, MD;  Location: [place];  Service: Open Heart Surgery;  Laterality: N/A;  cabg x four;  using left internal mammary artery, and left leg greater saphenous vein harvested endoscopically  . CORONARY STENT INTERVENTION N/A 10/13/2016   Procedure: Coronary Stent Intervention;  Surgeon: Troy Sine, MD;  Location: [place] CV LAB;  Service: Cardiovascular;  Laterality: N/A;  . DIALYSIS/PERMA CATHETER REMOVAL N/A 04/18/2017   Procedure: DIALYSIS/PERMA CATHETER REMOVAL;  Surgeon: Katha Cabal, MD;  Location: [place] CV LAB;  Service: Cardiovascular;  Laterality: N/A;  . DILATION AND CURETTAGE OF UTERUS    . ESOPHAGOGASTRODUODENOSCOPY  01/20/2012   Procedure: ESOPHAGOGASTRODUODENOSCOPY (EGD);  Surgeon: Ladene Artist, MD,FACG;  Location: [facility] ENDOSCOPY;  Service: Endoscopy;  Laterality: N/A;  . ESOPHAGOGASTRODUODENOSCOPY N/A 03/26/2013   Procedure:  ESOPHAGOGASTRODUODENOSCOPY (EGD);  Surgeon: Irene Shipper, MD;  Location: [facility] ENDOSCOPY;  Service: Endoscopy;  Laterality: N/A;  . ESOPHAGOGASTRODUODENOSCOPY N/A 04/30/2015   Procedure: ESOPHAGOGASTRODUODENOSCOPY (EGD);  Surgeon: Rogene Houston, MD;  [facility];  Service: Endoscopy;  Laterality: N/A;  1pm - moved to 10/20 @ 1:10  . ESOPHAGOGASTRODUODENOSCOPY N/A 07/29/2016   Procedure: ESOPHAGOGASTRODUODENOSCOPY (EGD);  Surgeon: Manus Gunning, MD;  Location: [place];  Service: Gastroenterology;  Laterality: N/A;  enteroscopy  . ESOPHAGOGASTRODUODENOSCOPY N/A 09/26/2019   Procedure: ESOPHAGOGASTRODUODENOSCOPY (EGD);  Surgeon: Rogene Houston, MD;  [facility];  Service: Endoscopy;  Laterality: N/A;  1250  . GIVENS CAPSULE STUDY N/A 03/07/2019   Procedure: GIVENS CAPSULE STUDY;  Surgeon: Rogene Houston, MD;  [facility];  Service: Endoscopy;  Laterality: N/A;  7:30  . INSERTION OF DIALYSIS CATHETER N/A 10/05/2020   Procedure: ABORTED TUNNELED DIALYSIS CATHETER PLACEMENT RIGHT INTERNAL JUGULAR VEIN ;  Surgeon: Virl Cagey, MD;  Location: [facility];  Service: General;  Laterality: N/A;  . INTRAOPERATIVE TRANSESOPHAGEAL ECHOCARDIOGRAM  06/13/2012   Procedure: INTRAOPERATIVE TRANSESOPHAGEAL ECHOCARDIOGRAM;  Surgeon: Grace Isaac, MD;  Location: [place];  Service: Open Heart Surgery;  Laterality: N/A;  . IR FLUORO GUIDE CV LINE RIGHT  06/17/2020  . IR GENERIC HISTORICAL  07/26/2016   IR FLUORO GUIDE CV LINE RIGHT 07/26/2016 Greggory Keen, MD [facility]  . IR GENERIC HISTORICAL  07/26/2016   IR US GUIDE VASC ACCESS RIGHT 07/26/2016 Greggory Keen, MD [facility]  . IR GENERIC HISTORICAL  08/02/2016   IR US GUIDE VASC ACCESS RIGHT 08/02/2016 Greggory Keen, MD [facility]  . IR GENERIC HISTORICAL  08/02/2016   IR FLUORO GUIDE CV LINE RIGHT 08/02/2016 Greggory Keen, MD [facility]  . IR RADIOLOGY PERIPHERAL GUIDED IV START  03/28/2017  . IR REMOVAL TUN  CV CATH W/O FL  08/11/2020  . IR THROMBECTOMY AV FISTULA W/THROMBOLYSIS INC/SHUNT/IMG LEFT Left 06/17/2020  . IR US GUIDE VASC ACCESS LEFT  06/17/2020  . IR US GUIDE VASC ACCESS RIGHT  03/28/2017  . IR US GUIDE VASC ACCESS RIGHT  06/17/2020  . LEFT HEART CATH AND CORONARY ANGIOGRAPHY N/A 09/20/2016   Procedure: Left Heart Cath and Coronary Angiography;  Surgeon: [name], MD;  Location: [place] CV LAB;  Service: Cardiovascular;  Laterality: N/A;  . LEFT HEART CATH AND CORS/GRAFTS ANGIOGRAPHY N/A 10/13/2016   Procedure: Left Heart Cath and Cors/Grafts Angiography;  Surgeon: Troy Sine, MD;  Location: [place] CV LAB;  Service: Cardiovascular;  Laterality: N/A;  . LEFT HEART CATH AND CORS/GRAFTS ANGIOGRAPHY N/A 07/13/2018   Procedure: LEFT HEART CATH AND CORS/GRAFTS ANGIOGRAPHY;  Surgeon: Martinique, Peter M, MD;  Location: [place] CV LAB;  Service: Cardiovascular;  Laterality: N/A;  . LEFT HEART CATHETERIZATION WITH CORONARY ANGIOGRAM N/A 12/15/2011   Procedure: LEFT HEART CATHETERIZATION WITH CORONARY ANGIOGRAM;  Surgeon: Burnell Blanks, MD;  Location: [facility] CATH LAB;  Service: Cardiovascular;  Laterality: N/A;  . LEFT HEART CATHETERIZATION WITH CORONARY ANGIOGRAM N/A 01/10/2012   Procedure: LEFT HEART CATHETERIZATION WITH CORONARY ANGIOGRAM;  Surgeon: Peter M Martinique, MD;  Location: [facility] CATH LAB;  Service: Cardiovascular;  Laterality: N/A;  . LEFT HEART CATHETERIZATION WITH CORONARY ANGIOGRAM N/A 06/08/2012   Procedure: LEFT HEART CATHETERIZATION WITH CORONARY ANGIOGRAM;  Surgeon: Burnell Blanks, MD;  Location: [facility] CATH LAB;  Service: Cardiovascular;  Laterality: N/A;  . LEFT HEART CATHETERIZATION WITH CORONARY/GRAFT ANGIOGRAM N/A 12/10/2013   Procedure: LEFT HEART CATHETERIZATION WITH Beatrix Fetters;  Surgeon: [name], MD;  Location: [facility] CATH LAB;  Service: Cardiovascular;  Laterality: N/A;  . OVARY SURGERY     ovarian cancer  . POLYPECTOMY  03/09/2019    Procedure: POLYPECTOMY;  Surgeon: Rogene Houston, MD;  [facility];  Service: Endoscopy;;  cecal   . POLYPECTOMY N/A 09/26/2019   Procedure: DUODENAL POLYPECTOMY;  Surgeon: Rogene Houston, MD;  [facility];  Service: Endoscopy;  Laterality: N/A;  . REVISION OF ARTERIOVENOUS GORETEX GRAFT N/A 02/24/2017   Procedure: REVISION OF ARTERIOVENOUS GORETEX GRAFT (RESECTION);  Surgeon: Katha Cabal, MD;  Location: [facility] ORS;  Service: Vascular;  Laterality: N/A;  . REVISON OF ARTERIOVENOUS FISTULA Left 06/19/2020   Procedure: REVISION OF LEFT UPPER ARM AV GRAFT WITH INTERPOSITION JUMP GRAFT USING 6MM GORE LIMB;  Surgeon: Marty Heck, MD;  Location: [place];  Service: Vascular;  Laterality: Left;  . SHUNTOGRAM N/A 10/15/2013   Procedure:  Fistulogram;  Surgeon: Serafina Mitchell, MD;  Location: [facility] CATH LAB;  Service: Cardiovascular;  Laterality: N/A;  . THROMBECTOMY / ARTERIOVENOUS GRAFT REVISION  2011   left upper arm  . TUBAL LIGATION  1980's  . UPPER EXTREMITY ANGIOGRAPHY Bilateral 12/06/2016   Procedure: Upper Extremity Angiography;  Surgeon: Katha Cabal, MD;  Location: [place] CV LAB;  Service: Cardiovascular;  Laterality: Bilateral;  . UPPER EXTREMITY INTERVENTION Left 06/06/2017   Procedure: UPPER EXTREMITY INTERVENTION;  Surgeon: Katha Cabal, MD;  Location: [place] CV LAB;  Service: Cardiovascular;  Laterality: Left;    Current Medications: Outpatient Medications Prior to Visit  Medication Sig Dispense Refill  . amiodarone (PACERONE) 200 MG tablet Take 1 tablet (200 mg total) by mouth daily. Starting July 2 90 tablet 3  . aspirin EC 81 MG tablet Take 81 mg by mouth daily.     . fluticasone (FLONASE) 50 MCG/ACT nasal spray Place 1 spray into both nostrils at bedtime as needed for allergies.     Marland Kitchen lidocaine-prilocaine (EMLA) cream Apply 1 application topically every Monday, Wednesday, and Friday. Prior to dialysis    . loratadine  (CLARITIN) 10 MG tablet Take 1 tablet (10 mg total) by mouth daily as needed for allergies. 30 tablet 0  . multivitamin (RENA-VIT) TABS tablet Take 1 tablet by mouth daily.    . nitroGLYCERIN (NITROSTAT) 0.4 MG SL tablet Place 1 tablet (0.4 mg total) under the tongue every 5 (five) minutes x 3 doses as needed for chest pain (if no relief after 2nd dose, proceed to the ED for an evaluation or call 911). 25 tablet 2  . omeprazole (PRILOSEC) 40 MG capsule Take 1 capsule (40 mg total) by mouth daily before breakfast. 30 capsule 5  . sevelamer carbonate (RENVELA) 800 MG tablet Take 2 tablets (1,600 mg total) by mouth 3 (three) times daily with meals. 180 tablet 1  . simvastatin (ZOCOR) 20 MG tablet Take 1 tablet (20 mg total) by mouth at bedtime. 90 tablet 3  . isosorbide mononitrate (IMDUR) 30 MG 24 hr tablet Take 3 tablets (90 mg total) by mouth 2 (two) times daily. 30 tablet 2   No facility-administered medications prior to visit.     Allergies:   Aspirin, Penicillins, Amlodipine, Bactrim [sulfamethoxazole-trimethoprim], Contrast media [iodinated diagnostic agents], Iron, Nitrofurantoin, Tylenol [acetaminophen], Gabapentin, Hydralazine, Levofloxacin, Ranexa [ranolazine], Dexilant [dexlansoprazole], Levaquin [levofloxacin in d5w], Morphine and related, Plavix [clopidogrel bisulfate], Protonix [pantoprazole sodium], and Venofer [ferric oxide]   Social History   Socioeconomic History  . Marital status: Married    Spouse name: [name]  . Number of children: Not on file  . Years of education: Not on file  . Highest education level: Not on file  Occupational History  . Not on file  Tobacco Use  . Smoking status: Never Smoker  . Smokeless tobacco: Never Used  Vaping Use  . Vaping Use: Never used  Substance and Sexual Activity  . Alcohol use: No    Alcohol/week: 0.0 standard drinks  . Drug use: No  . Sexual activity: Yes    Birth control/protection: Surgical  Other Topics Concern  . Not on  file  Social History Narrative   Lives in [place], [place] with husband.  Dialysis pt - [facility]wf.   Social Determinants of Health   Financial Resource Strain: Not on file  Food Insecurity: Not on file  Transportation Needs: Not on file  Physical Activity: Not on file  Stress: Not on file  Social Connections:  Not on file     Family History:  The patient's family history includes Colon cancer (age of onset: 52) in her child; Diabetes in her brother, father, mother, and sister; Heart attack in her brother, brother, and mother; Heart disease in her father and mother; Hyperlipidemia in her brother, father, and mother; Hypertension in her father, mother, sister, and sister; Other in an other family member.   Review of Systems:   Please see the history of present illness.     General:  No chills, fever, night sweats or weight changes.  Cardiovascular:  No dyspnea on exertion, edema, orthopnea, palpitations, paroxysmal nocturnal dyspnea. Positive for chest pain.  Dermatological: No rash, lesions/masses Respiratory: No cough, dyspnea Urologic: No hematuria, dysuria Abdominal:   No nausea, vomiting, diarrhea, bright red blood per rectum, melena, or hematemesis Neurologic:  No visual changes, wkns, changes in mental status. All other systems reviewed and are otherwise negative except as noted above.   Physical Exam:    VS:  BP (!) 132/50   Pulse 66   Ht 5\' 1"  (1.549 m)   Wt 139 lb 6.4 oz (63.2 kg)   SpO2 94%   BMI 26.34 kg/m    General: Well developed, well nourished,female appearing in no acute distress. Head: Normocephalic, atraumatic. Neck: No carotid bruits. JVD not elevated.  Lungs: Respirations regular and unlabored, without wheezes or rales.  Heart: Regular rate and rhythm. No S3 or S4.  No murmur, no rubs, or gallops appreciated. Abdomen: Appears non-distended. No obvious abdominal masses. Msk:  Strength and tone appear normal for age. No obvious joint deformities or  effusions. Extremities: No clubbing or cyanosis. Trace ankle edema bilaterally.  Distal pedal pulses are 2+ bilaterally. Neuro: Alert and oriented X 3. Moves all extremities spontaneously. No focal deficits noted. Psych:  Responds to questions appropriately with a normal affect. Skin: No rashes or lesions noted  Wt Readings from Last 3 Encounters:  10/20/20 139 lb 6.4 oz (63.2 kg)  10/07/20 145 lb 4.5 oz (65.9 kg)  09/29/20 138 lb 3.7 oz (62.7 kg)     Studies/Labs Reviewed:   EKG:  EKG is not ordered today.   Recent Labs: 08/30/2020: TSH 7.243 08/31/2020: ALT 11 09/29/2020: B Natriuretic Peptide 161.0 10/05/2020: Magnesium 1.9 10/07/2020: BUN 57; Creatinine, Ser 9.73; Hemoglobin 7.7; Platelets 238; Potassium 4.3; Sodium 133   Lipid Panel    Component Value Date/Time   CHOL 122 09/29/2020 0656   TRIG 45 09/29/2020 0656   HDL 63 09/29/2020 0656   CHOLHDL 1.9 09/29/2020 0656   VLDL 9 09/29/2020 0656   LDLCALC 50 09/29/2020 0656    Additional studies/ records that were reviewed today include:   Cardiac Catheterization: 07/2018  Ost RPDA to RPDA lesion is 75% stenosed.  Mid LM to Dist LM lesion is 60% stenosed.  Ost Ramus to Ramus lesion is 80% stenosed.  Ost Cx to Mid Cx lesion is 99% stenosed.  Prox LAD lesion is 90% stenosed.  Ost 1st Diag to 1st Diag lesion is 100% stenosed.  Ost RCA to Prox RCA lesion is 100% stenosed.  LIMA graft was visualized by angiography and is large.  The graft exhibits no disease.  SVG graft was visualized by angiography and is large.  The graft exhibits no disease.  SVG graft was visualized by angiography and is normal in caliber.  SVG graft was visualized by angiography and is large.  The graft exhibits no disease.  The left ventricular systolic function is normal.  LV  end diastolic pressure is mildly elevated.  The left ventricular ejection fraction is 55-65% by visual estimate.   1. Severe 3 vessel occlusive CAD    - 60%  distal left main    - 90% mid LAD    - 100% first diagonal at prior stent site    - 99% proximal LCx at prior stent site    - 80% ramus intermediate.    - 100% ostial RCA at prior stent site    - 75% proximal PDA 2. Patent LIMA to the LAD 3. Patent SVG to ramus intermediate 4. Patent SVG to OM 5. Patent SVG to the distal RCA. 6. Good LV function 7. Mildly elevated LVEDP  Plan: Compared to prior cath in April 2018 the diagonal is now occluded. There is progression of disease in the PDA. It is unclear if this is the reason for her chest pain. She is severely hypertensive and EDP is mildly elevated. I would recommend medical management with good BP control. If she has refractory angina despite good BP and volume management we could consider PCI of the PDA but this would have to be done through the SVG and in general she has not responded well to stenting in the past with all prior stents occluded. I will increase her Coreg dose. She will need dialysis in am.    Echocardiogram: 12/2018 IMPRESSIONS    1. The left ventricle has normal systolic function with an ejection  fraction of 60-65%. The cavity size was normal. There is mild concentric  left ventricular hypertrophy. Left ventricular diastolic Doppler  parameters are consistent with impaired  relaxation. Elevated left ventricular end-diastolic pressure No evidence  of left ventricular regional wall motion abnormalities.  2. Left atrial size was mildly dilated.  3. The mitral valve is grossly normal. Mild thickening of the mitral  valve leaflet. There is mild mitral annular calcification present. Mitral  valve regurgitation is mild to moderate by color flow Doppler.  4. The tricuspid valve is grossly normal.  5. The aortic valve is tricuspid. Mild thickening of the aortic valve.  6. Moderate aortic annular calcification.  7. The aortic root is normal in size and structure.   Assessment:    1. Coronary artery disease of  native artery of native heart with stable angina pectoris (HCC)   2. Paroxysmal atrial fibrillation ([place])   3. NSVT (nonsustained ventricular tachycardia) ([place])   4. Essential hypertension      Plan:   In order of problems listed above:  1. CAD - She is s/p CABG in 2013 with DES to D1 in 10/2016 with cath showing patent LIMA-LAD, SVG-RI, SVG-Mrg and SVG-RCA and repeat cath in 07/2018 showingpatentgrafts with occlusion of D1 at prior stent site and progression of PDA disease and medical management was recommended. - She does have stable angina which resolves with SL NTG x1. In reviewing medications, she is listed as taking Imdur 90mg  BID but has only been taking 30mg  BID. I recommended she titrate this to 60mg  BID as previously recommended. Will titrate slowly as she does have issues with hypotension during HD at times. Continue ASA 81mg  daily and Simvastatin 20mg  daily (LDL at 50 in 09/2020 and would not further titrate as the maximum recommended dose of this is 20mg  daily given the concurrent use of Amiodarone). She was previously intolerant to BB therapy and Ranexa.   2. Paroxysmal Atrial Fibrillation - She denies any recent palpitations and her HR is well-controlled in the 60's during today's  visit. She is not currently on AV nodal blocking agents and has not been on anticoagulation given history of GIB.  3. NSVT - She has been on Amiodarone 200mg  daily since at least 2017 and has tolerated this well. LFT's were stable when checked in 08/2020 but TSH was elevated to 7.243. Will obtain labs from her PCP to see if this has been checked in the interim. If not, would recheck TSH and Free T4 at her next visit.   4. HTN - BP is well-controlled at 132/50 during today's visit. Continue Imdur with dose adjustment as outlined above. If she develops hypotension on HD days, may need to hold her AM dose the morning of HD.     Medication Adjustments/Labs and Tests Ordered: Current medicines are  reviewed at length with the patient today.  Concerns regarding medicines are outlined above.  Medication changes, Labs and Tests ordered today are listed in the Patient Instructions below. Patient Instructions  Medication Instructions:  Your physician recommends that you continue on your current medications as directed. Please refer to the Current Medication list given to you today.  Increase Imdur to 60 mg Two Times Daily   *If you need a refill on your cardiac medications before your next appointment, please call your pharmacy*   Lab Work: NONE   If you have labs (blood work) drawn today and your tests are completely normal, you will receive your results only by: Marland Kitchen MyChart Message (if you have MyChart) OR . A paper copy in the mail If you have any lab test that is abnormal or we need to change your treatment, we will call you to review the results.   Testing/Procedures: NONE   Follow-Up: At [facility], you and your health needs are our priority.  As part of our continuing mission to provide you with exceptional heart care, we have created designated Provider Care Teams.  These Care Teams include your primary Cardiologist (physician) and Advanced Practice Providers (APPs -  Physician Assistants and Nurse Practitioners) who all work together to provide you with the care you need, when you need it.  We recommend signing up for the patient portal called "MyChart".  Sign up information is provided on this After Visit Summary.  MyChart is used to connect with patients for Virtual Visits (Telemedicine).  Patients are able to view lab/test results, encounter notes, upcoming appointments, etc.  Non-urgent messages can be sent to your provider as well.   To learn more about what you can do with MyChart, go to [email or web address].    Your next appointment:   1 month(s)  The format for your next appointment:   In Person  Provider:   Carlyle Dolly, MD   Other Instructions Thank  you for choosing [facility]!       Signed, Erma Heritage, PA-C  10/20/2020 7:05 PM    [place] [address] [place], [place] [address] Phone: [phone] Fax: [phone]

## 2020-10-20 ENCOUNTER — Ambulatory Visit (INDEPENDENT_AMBULATORY_CARE_PROVIDER_SITE_OTHER): Payer: Medicare HMO | Admitting: Student

## 2020-10-20 ENCOUNTER — Encounter: Payer: Self-pay | Admitting: Student

## 2020-10-20 ENCOUNTER — Other Ambulatory Visit: Payer: Self-pay

## 2020-10-20 VITALS — BP 132/50 | HR 66 | Ht 61.0 in | Wt 139.4 lb

## 2020-10-20 DIAGNOSIS — I4729 Other ventricular tachycardia: Secondary | ICD-10-CM

## 2020-10-20 DIAGNOSIS — I472 Ventricular tachycardia: Secondary | ICD-10-CM

## 2020-10-20 DIAGNOSIS — I480 Paroxysmal atrial fibrillation: Secondary | ICD-10-CM

## 2020-10-20 DIAGNOSIS — I25118 Atherosclerotic heart disease of native coronary artery with other forms of angina pectoris: Secondary | ICD-10-CM | POA: Diagnosis not present

## 2020-10-20 DIAGNOSIS — I10 Essential (primary) hypertension: Secondary | ICD-10-CM | POA: Diagnosis not present

## 2020-10-20 MED ORDER — ISOSORBIDE MONONITRATE ER 60 MG PO TB24: 60.0000 mg | ORAL_TABLET | Freq: Two times a day (BID) | ORAL | 3 refills | Status: DC

## 2020-10-20 NOTE — Patient Instructions (Signed)
Medication Instructions:  Your physician recommends that you continue on your current medications as directed. Please refer to the Current Medication list given to you today.  Increase Imdur to 60 mg Two Times Daily   *If you need a refill on your cardiac medications before your next appointment, please call your pharmacy*   Lab Work: NONE   If you have labs (blood work) drawn today and your tests are completely normal, you will receive your results only by: Marland Kitchen MyChart Message (if you have MyChart) OR . A paper copy in the mail If you have any lab test that is abnormal or we need to change your treatment, we will call you to review the results.   Testing/Procedures: NONE   Follow-Up: At [facility], you and your health needs are our priority.  As part of our continuing mission to provide you with exceptional heart care, we have created designated Provider Care Teams.  These Care Teams include your primary Cardiologist (physician) and Advanced Practice Providers (APPs -  Physician Assistants and Nurse Practitioners) who all work together to provide you with the care you need, when you need it.  We recommend signing up for the patient portal called "MyChart".  Sign up information is provided on this After Visit Summary.  MyChart is used to connect with patients for Virtual Visits (Telemedicine).  Patients are able to view lab/test results, encounter notes, upcoming appointments, etc.  Non-urgent messages can be sent to your provider as well.   To learn more about what you can do with MyChart, go to [email or web address].    Your next appointment:   1 month(s)  The format for your next appointment:   In Person  Provider:   Carlyle Dolly, MD   Other Instructions Thank you for choosing [facility]!

## 2020-11-04 ENCOUNTER — Other Ambulatory Visit (INDEPENDENT_AMBULATORY_CARE_PROVIDER_SITE_OTHER): Payer: Self-pay | Admitting: Vascular Surgery

## 2020-11-04 DIAGNOSIS — Z992 Dependence on renal dialysis: Secondary | ICD-10-CM

## 2020-11-04 DIAGNOSIS — N186 End stage renal disease: Secondary | ICD-10-CM

## 2020-11-05 ENCOUNTER — Ambulatory Visit (INDEPENDENT_AMBULATORY_CARE_PROVIDER_SITE_OTHER): Payer: Medicare HMO | Admitting: Nurse Practitioner

## 2020-11-05 ENCOUNTER — Other Ambulatory Visit: Payer: Self-pay

## 2020-11-05 ENCOUNTER — Ambulatory Visit (INDEPENDENT_AMBULATORY_CARE_PROVIDER_SITE_OTHER): Payer: Medicare HMO | Admitting: Gastroenterology

## 2020-11-05 ENCOUNTER — Ambulatory Visit (INDEPENDENT_AMBULATORY_CARE_PROVIDER_SITE_OTHER): Payer: Medicare HMO

## 2020-11-05 VITALS — BP 198/64 | HR 76 | Ht 62.0 in | Wt 137.0 lb

## 2020-11-05 DIAGNOSIS — N186 End stage renal disease: Secondary | ICD-10-CM | POA: Diagnosis not present

## 2020-11-05 DIAGNOSIS — E782 Mixed hyperlipidemia: Secondary | ICD-10-CM | POA: Diagnosis not present

## 2020-11-05 DIAGNOSIS — I10 Essential (primary) hypertension: Secondary | ICD-10-CM

## 2020-11-05 DIAGNOSIS — Z992 Dependence on renal dialysis: Secondary | ICD-10-CM

## 2020-11-08 ENCOUNTER — Encounter (INDEPENDENT_AMBULATORY_CARE_PROVIDER_SITE_OTHER): Payer: Self-pay | Admitting: Nurse Practitioner

## 2020-11-08 NOTE — Progress Notes (Signed)
Subjective:    Patient ID: Shawna [name], female    DOB: 1940/09/08, 80 y.o.   MRN: [id number] Chief Complaint  Patient presents with  . Follow-up    6 Mo hda     The patient returns to the office for followup of their dialysis access.  The patient had recent intervention at [facility] on 09/29/2020.  The function of the access has been stable. The patient denies increased bleeding time or increased recirculation. Patient denies difficulty with cannulation. The patient denies hand pain or other symptoms consistent with steal phenomena.  No significant arm swelling.  The patient denies redness or swelling at the access site. The patient denies fever or chills at home or while on dialysis.  The patient denies amaurosis fugax or recent TIA symptoms. There are no recent neurological changes noted. The patient denies claudication symptoms or rest pain symptoms. The patient denies history of DVT, PE or superficial thrombophlebitis. The patient denies recent episodes of angina or shortness of breath.   Today the patient has a flow volume of 1988.  There are no areas of significant stenosis.      Review of Systems  Respiratory: Negative for chest tightness.   Neurological: Negative for headaches.  Hematological: Does not bruise/bleed easily.  All other systems reviewed and are negative.      Objective:   Physical Exam Vitals reviewed.  HENT:     Head: Normocephalic.  Cardiovascular:     Rate and Rhythm: Normal rate.     Pulses:          Radial pulses are 2+ on the left side.     Arteriovenous access: left arteriovenous access is present.    Comments: Good thrill and bruit Pulmonary:     Effort: Pulmonary effort is normal.  Neurological:     Mental Status: She is alert and oriented to person, place, and time. Mental status is at baseline.  Psychiatric:        Mood and Affect: Mood normal.        Behavior: Behavior normal.        Thought Content: Thought content normal.         Judgment: Judgment normal.     BP (!) 198/64   Pulse 76   Ht 5\' 2"  (1.575 m)   Wt 137 lb (62.1 kg)   BMI 25.06 kg/m   Past Medical History:  Diagnosis Date  . Acute on chronic respiratory failure with hypoxia ([place]) 10/10/2016  . Anxiety   . Arthritis   . AVM (arteriovenous malformation) of colon   . CAD (coronary artery disease)    a. s/p CABG in 2013 b. DES to D1 in 10/2016. c. cath in 07/2018 showing patent grafts with occlusion of D1 at prior stent site and progression of PDA disease --> medical management recommended  . Carotid artery disease ([place])    a. 85-63% LICA, 07/4968   . Chronic anemia   . Chronic bronchitis ([place])   . Chronic diastolic CHF (congestive heart failure) ([place])    a. 02/2012 Echo EF 60-65%, nl wall motion, Gr 1 DD, mod MR  . Colon cancer ([place]) 1992  . Esophageal stricture   . ESRD on hemodialysis ([place])    ESRD due to HTN, started dialysis 2011 and gets HD at [facility] with Dr Hinda Lenis on MWF schedule.  Access is LUA AVF as of Sept 2014.   Marland Kitchen GERD (gastroesophageal reflux disease)   . High cholesterol 12/2011  .  History of blood transfusion 07/2011; 12/2011; 01/2012 X 2; 04/2012  . History of gout   . History of lower GI bleeding   . Hypertension   . Iron deficiency anemia   . Mitral regurgitation    a. Moderate by echo, 02/2012  . Myocardial infarction ([place])   . NSVT (nonsustained ventricular tachycardia) ([place])   . Ovarian cancer ([place]) 1992  . PAF (paroxysmal atrial fibrillation) ([place])   . Pneumonia ~ 2009  . PUD (peptic ulcer disease)   . TIA (transient ischemic attack)     Social History   Socioeconomic History  . Marital status: Married    Spouse name: [name]  . Number of children: Not on file  . Years of education: Not on file  . Highest education level: Not on file  Occupational History  . Not on file  Tobacco Use  . Smoking status: Never Smoker  . Smokeless tobacco: Never Used  Vaping Use  . Vaping Use: Never used  Substance and  Sexual Activity  . Alcohol use: No    Alcohol/week: 0.0 standard drinks  . Drug use: No  . Sexual activity: Yes    Birth control/protection: Surgical  Other Topics Concern  . Not on file  Social History Narrative   Lives in [place], [place] with husband.  Dialysis pt - [facility].   Social Determinants of Health   Financial Resource Strain: Not on file  Food Insecurity: Not on file  Transportation Needs: Not on file  Physical Activity: Not on file  Stress: Not on file  Social Connections: Not on file  Intimate Partner Violence: Not on file    Past Surgical History:  Procedure Laterality Date  . A/V SHUNTOGRAM Left 03/19/2019   Procedure: A/V SHUNTOGRAM;  Surgeon: Katha Cabal, MD;  Location: [place] [facility];  Service: Cardiovascular;  Laterality: Left;  . ABDOMINAL HYSTERECTOMY  1992  . APPENDECTOMY  06/1990  . AV FISTULA PLACEMENT  07/2009   left upper arm  . AV FISTULA PLACEMENT Right 09/06/2016   Procedure: RIGHT FOREARM ARTERIOVENOUS (AV) GRAFT;  Surgeon: Elam Dutch, MD;  Location: [facility];  Service: Vascular;  Laterality: Right;  . AV FISTULA PLACEMENT N/A 02/24/2017   Procedure: INSERTION OF ARTERIOVENOUS (AV) GORE-TEX GRAFT ARM (BRACHIAL AXILLARY);  Surgeon: Katha Cabal, MD;  Location: [facility] ORS;  Service: Vascular;  Laterality: N/A;  . [place] Right 09/06/2016   Procedure: REMOVAL OF Right Arm ARTERIOVENOUS GORETEX GRAFT and Vein Patch angioplasty of brachial artery;  Surgeon: Angelia Mould, MD;  Location: [facility];  Service: Vascular;  Laterality: Right;  . BIOPSY  09/26/2019   Procedure: BIOPSY;  Surgeon: Rogene Houston, MD;  [facility];  Service: Endoscopy;;  . COLON RESECTION  1992  . COLON SURGERY    . COLONOSCOPY N/A 03/09/2019   Procedure: COLONOSCOPY;  Surgeon: Rogene Houston, MD;  [facility];  Service: Endoscopy;  Laterality: N/A;  . CORONARY ANGIOPLASTY WITH STENT PLACEMENT  12/15/11   "2"  . CORONARY ANGIOPLASTY WITH  STENT PLACEMENT  y/2013   "1; makes total of 3" (05/02/2012)  . CORONARY ARTERY BYPASS GRAFT  06/13/2012   Procedure: CORONARY ARTERY BYPASS GRAFTING (CABG);  Surgeon: Grace Isaac, MD;  Location: [name];  Service: Open Heart Surgery;  Laterality: N/A;  cabg x four;  using left internal mammary artery, and left leg greater saphenous vein harvested endoscopically  . CORONARY STENT INTERVENTION N/A 10/13/2016   Procedure: Coronary  Stent Intervention;  Surgeon: Troy Sine, MD;  Location: [place] [facility];  Service: Cardiovascular;  Laterality: N/A;  . DIALYSIS/PERMA CATHETER REMOVAL N/A 04/18/2017   Procedure: DIALYSIS/PERMA CATHETER REMOVAL;  Surgeon: Katha Cabal, MD;  Location: [place] [facility];  Service: Cardiovascular;  Laterality: N/A;  . DILATION AND CURETTAGE OF UTERUS    . ESOPHAGOGASTRODUODENOSCOPY  01/20/2012   Procedure: ESOPHAGOGASTRODUODENOSCOPY (EGD);  Surgeon: Ladene Artist, MD,FACG;  Location: [facility] ENDOSCOPY;  Service: Endoscopy;  Laterality: N/A;  . ESOPHAGOGASTRODUODENOSCOPY N/A 03/26/2013   Procedure: ESOPHAGOGASTRODUODENOSCOPY (EGD);  Surgeon: Irene Shipper, MD;  Location: [facility] ENDOSCOPY;  Service: Endoscopy;  Laterality: N/A;  . ESOPHAGOGASTRODUODENOSCOPY N/A 04/30/2015   Procedure: ESOPHAGOGASTRODUODENOSCOPY (EGD);  Surgeon: Rogene Houston, MD;  [facility];  Service: Endoscopy;  Laterality: N/A;  1pm - moved to 10/20 @ 1:10  . ESOPHAGOGASTRODUODENOSCOPY N/A 07/29/2016   Procedure: ESOPHAGOGASTRODUODENOSCOPY (EGD);  Surgeon: Manus Gunning, MD;  Location: [facility];  Service: Gastroenterology;  Laterality: N/A;  enteroscopy  . ESOPHAGOGASTRODUODENOSCOPY N/A 09/26/2019   Procedure: ESOPHAGOGASTRODUODENOSCOPY (EGD);  Surgeon: Rogene Houston, MD;  [facility];  Service: Endoscopy;  Laterality: N/A;  1250  . GIVENS CAPSULE STUDY N/A 03/07/2019   Procedure: GIVENS CAPSULE STUDY;  Surgeon: Rogene Houston, MD;  [facility];   Service: Endoscopy;  Laterality: N/A;  7:30  . INSERTION OF DIALYSIS CATHETER N/A 10/05/2020   Procedure: ABORTED TUNNELED DIALYSIS CATHETER PLACEMENT RIGHT INTERNAL JUGULAR VEIN ;  Surgeon: Virl Cagey, MD;  Location: [facility];  Service: General;  Laterality: N/A;  . INTRAOPERATIVE TRANSESOPHAGEAL ECHOCARDIOGRAM  06/13/2012   Procedure: INTRAOPERATIVE TRANSESOPHAGEAL ECHOCARDIOGRAM;  Surgeon: Grace Isaac, MD;  Location: [facility];  Service: Open Heart Surgery;  Laterality: N/A;  . IR DIALY SHUNT INTRO NEEDLE/INTRACATH INITIAL W/IMG LEFT Left 10/06/2020  . IR FLUORO GUIDE CV LINE RIGHT  06/17/2020  . IR GENERIC HISTORICAL  07/26/2016   IR FLUORO GUIDE CV LINE RIGHT 07/26/2016 Greggory Keen, MD [facility]  . IR GENERIC HISTORICAL  07/26/2016   IR US GUIDE VASC ACCESS RIGHT 07/26/2016 Greggory Keen, MD [facility]  . IR GENERIC HISTORICAL  08/02/2016   IR US GUIDE VASC ACCESS RIGHT 08/02/2016 Greggory Keen, MD [facility]  . IR GENERIC HISTORICAL  08/02/2016   IR FLUORO GUIDE CV LINE RIGHT 08/02/2016 Greggory Keen, MD [facility]  . IR RADIOLOGY PERIPHERAL GUIDED IV START  03/28/2017  . IR REMOVAL TUN CV CATH W/O FL  08/11/2020  . IR THROMBECTOMY AV FISTULA W/THROMBOLYSIS INC/SHUNT/IMG LEFT Left 06/17/2020  . IR US GUIDE VASC ACCESS LEFT  06/17/2020  . IR US GUIDE VASC ACCESS RIGHT  03/28/2017  . IR US GUIDE VASC ACCESS RIGHT  06/17/2020  . LEFT HEART CATH AND CORONARY ANGIOGRAPHY N/A 09/20/2016   Procedure: Left Heart Cath and Coronary Angiography;  Surgeon: Shawna Crome, MD;  Location: [place] [facility];  Service: Cardiovascular;  Laterality: N/A;  . LEFT HEART CATH AND CORS/GRAFTS ANGIOGRAPHY N/A 10/13/2016   Procedure: Left Heart Cath and Cors/Grafts Angiography;  Surgeon: Troy Sine, MD;  Location: [place] [facility];  Service: Cardiovascular;  Laterality: N/A;  . LEFT HEART CATH AND CORS/GRAFTS ANGIOGRAPHY N/A 07/13/2018   Procedure: LEFT HEART CATH AND CORS/GRAFTS ANGIOGRAPHY;  Surgeon:  Martinique, Peter M, MD;  Location: [place] [facility];  Service: Cardiovascular;  Laterality: N/A;  . LEFT HEART CATHETERIZATION WITH CORONARY ANGIOGRAM N/A 12/15/2011   Procedure: LEFT HEART  CATHETERIZATION WITH CORONARY ANGIOGRAM;  Surgeon: Burnell Blanks, MD;  Location: [facility] CATH LAB;  Service: Cardiovascular;  Laterality: N/A;  . LEFT HEART CATHETERIZATION WITH CORONARY ANGIOGRAM N/A 01/10/2012   Procedure: LEFT HEART CATHETERIZATION WITH CORONARY ANGIOGRAM;  Surgeon: Peter M Martinique, MD;  Location: [facility] CATH LAB;  Service: Cardiovascular;  Laterality: N/A;  . LEFT HEART CATHETERIZATION WITH CORONARY ANGIOGRAM N/A 06/08/2012   Procedure: LEFT HEART CATHETERIZATION WITH CORONARY ANGIOGRAM;  Surgeon: Burnell Blanks, MD;  Location: [facility] CATH LAB;  Service: Cardiovascular;  Laterality: N/A;  . LEFT HEART CATHETERIZATION WITH CORONARY/GRAFT ANGIOGRAM N/A 12/10/2013   Procedure: LEFT HEART CATHETERIZATION WITH Beatrix Fetters;  Surgeon: [name], MD;  Location: [facility] CATH LAB;  Service: Cardiovascular;  Laterality: N/A;  . OVARY SURGERY     ovarian cancer  . POLYPECTOMY  03/09/2019   Procedure: POLYPECTOMY;  Surgeon: Rogene Houston, MD;  [facility];  Service: Endoscopy;;  cecal   . POLYPECTOMY N/A 09/26/2019   Procedure: DUODENAL POLYPECTOMY;  Surgeon: Rogene Houston, MD;  [facility];  Service: Endoscopy;  Laterality: N/A;  . REVISION OF ARTERIOVENOUS GORETEX GRAFT N/A 02/24/2017   Procedure: REVISION OF ARTERIOVENOUS GORETEX GRAFT (RESECTION);  Surgeon: Katha Cabal, MD;  Location: [facility] ORS;  Service: Vascular;  Laterality: N/A;  . REVISON OF ARTERIOVENOUS FISTULA Left 06/19/2020   Procedure: REVISION OF LEFT UPPER ARM AV GRAFT WITH INTERPOSITION JUMP GRAFT USING 6MM GORE LIMB;  Surgeon: Marty Heck, MD;  Location: [place];  Service: Vascular;  Laterality: Left;  . SHUNTOGRAM N/A 10/15/2013   Procedure: Fistulogram;  Surgeon: Serafina Mitchell,  MD;  Location: [facility] CATH LAB;  Service: Cardiovascular;  Laterality: N/A;  . THROMBECTOMY / ARTERIOVENOUS GRAFT REVISION  2011   left upper arm  . TUBAL LIGATION  1980's  . UPPER EXTREMITY ANGIOGRAPHY Bilateral 12/06/2016   Procedure: Upper Extremity Angiography;  Surgeon: Katha Cabal, MD;  Location: [place] [facility];  Service: Cardiovascular;  Laterality: Bilateral;  . UPPER EXTREMITY INTERVENTION Left 06/06/2017   Procedure: UPPER EXTREMITY INTERVENTION;  Surgeon: Katha Cabal, MD;  Location: [place] [facility];  Service: Cardiovascular;  Laterality: Left;    Family History  Problem Relation Age of Onset  . Heart disease Mother        Heart Disease before age 36  . Hyperlipidemia Mother   . Hypertension Mother   . Diabetes Mother   . Heart attack Mother   . Heart disease Father        Heart Disease before age 34  . Hyperlipidemia Father   . Hypertension Father   . Diabetes Father   . Diabetes Sister   . Hypertension Sister   . Diabetes Brother   . Hyperlipidemia Brother   . Heart attack Brother   . Hypertension Sister   . Heart attack Brother   . Colon cancer Child 41  . Other Other        noncontributory for early CAD  . Esophageal cancer Neg Hx   . Liver disease Neg Hx   . Kidney disease Neg Hx   . Colon polyps Neg Hx     Allergies  Allergen Reactions  . Aspirin Other (See Comments)    High Doses Mess up her stomach; "makes my bowels have blood in them". Takes 81 mg EC Aspirin   . Penicillins Other (See Comments)    SYNCOPE? , "makes me real weak when I take it; like I'll  pass out"  Has patient had a PCN reaction causing immediate rash, facial/tongue/throat swelling, SOB or lightheadedness with hypotension: Yes Has patient had a PCN reaction causing severe rash involving mucus membranes or skin necrosis: no Has patient had a PCN reaction that required hospitalization no Has patient had a PCN reaction occurring within the last 10 years: no If all  of the above  . Amlodipine Swelling  . Bactrim [Sulfamethoxazole-Trimethoprim] Rash  . Contrast Media [Iodinated Diagnostic Agents] Itching  . Iron Itching and Other (See Comments)    "they gave me iron in dialysis; had to give me Benadryl cause I had to have the iron" (05/02/2012)  . Nitrofurantoin Hives  . Tylenol [Acetaminophen] Itching and Other (See Comments)    Makes her feet on fire per pt  . Gabapentin Other (See Comments)    Unknown reaction  . Hydralazine Itching  . Levofloxacin   . No Known Allergies   . Ranexa [Ranolazine] Other (See Comments)    Myoclonus-hospitalized   . Dexilant [Dexlansoprazole] Other (See Comments)    Upset stomach  . Levaquin [Levofloxacin In D5w] Rash  . Morphine And Related Itching and Other (See Comments)    Itching in feet  . Plavix [Clopidogrel Bisulfate] Rash  . Protonix [Pantoprazole Sodium] Rash  . Venofer [Ferric Oxide] Itching and Other (See Comments)    Patient reports using Benadryl prior to doses as [facility]    CBC Latest Ref Rng & Units 10/07/2020 10/05/2020 10/03/2020  WBC 4.0 - 10.5 K/uL 10.4 7.3 7.4  Hemoglobin 12.0 - 15.0 g/dL 7.7(L) 7.9(L) 10.2(L)  Hematocrit 36.0 - 46.0 % 25.0(L) 25.8(L) 34.8(L)  Platelets 150 - 400 K/uL 238 197 254      CMP     Component Value Date/Time   NA 133 (L) 10/07/2020 1149   K 4.3 10/07/2020 1149   CL 96 (L) 10/07/2020 1149   CO2 23 10/07/2020 1149   GLUCOSE 135 (H) 10/07/2020 1149   BUN 57 (H) 10/07/2020 1149   CREATININE 9.73 (H) 10/07/2020 1149   CREATININE 6.57 (H) 03/05/2019 1159   CALCIUM 10.0 10/07/2020 1149   CALCIUM 8.1 (L) 05/29/2013 1814   PROT 6.3 (L) 08/31/2020 0630   ALBUMIN 3.0 (L) 10/07/2020 1149   AST 8 (L) 08/31/2020 0630   ALT 11 08/31/2020 0630   ALKPHOS 85 08/31/2020 0630   BILITOT 0.7 08/31/2020 0630   GFRNONAA 4 (L) 10/07/2020 1149   GFRNONAA 6 (L) 03/05/2019 1159   GFRAA 9 (L) 02/12/2020 1712   GFRAA 6 (L) 03/05/2019 1159     No results found.      Assessment & Plan:   1. ESRD (end stage renal disease) ([place]) Recommend:  The patient is doing well and currently has adequate dialysis access. The patient's dialysis center is not reporting any access issues. Flow pattern is stable when compared to the prior ultrasound.  The patient should have a duplex ultrasound of the dialysis access in 6 months. The patient will follow-up with me in the office after each ultrasound     2. Essential hypertension Patient's blood pressure is elevated today.  Patient notes that her blood pressures have been off since her recent hospitalization.  Patient is advised to follow-up with cardiologist/nephrologist for possible medication titration.  Otherwise patient is advised to check blood pressure when she arrives home if remains elevated she should seek emergency attention.  3. Mixed hyperlipidemia Continue statin as ordered and reviewed, no changes at this time  Current Outpatient Medications on File Prior to Visit  Medication Sig Dispense Refill  . amiodarone (PACERONE) 200 MG tablet Take 1 tablet (200 mg total) by mouth daily. Starting July 2 90 tablet 3  . aspirin EC 81 MG tablet Take 81 mg by mouth daily.     . fluticasone (FLONASE) 50 MCG/ACT nasal spray Place 1 spray into both nostrils at bedtime as needed for allergies.     Marland Kitchen gabapentin (NEURONTIN) 100 MG capsule Take 100 mg by mouth daily.    . isosorbide mononitrate (IMDUR) 60 MG 24 hr tablet Take 1 tablet (60 mg total) by mouth in the morning and at bedtime. 180 tablet 3  . lidocaine-prilocaine (EMLA) cream Apply 1 application topically every Monday, Wednesday, and Friday. Prior to dialysis    . loratadine (CLARITIN) 10 MG tablet Take 1 tablet (10 mg total) by mouth daily as needed for allergies. 30 tablet 0  . multivitamin (RENA-VIT) TABS tablet Take 1 tablet by mouth daily.    . nitroGLYCERIN (NITROSTAT) 0.4 MG SL tablet Place 1 tablet (0.4 mg total) under the tongue every 5 (five)  minutes x 3 doses as needed for chest pain (if no relief after 2nd dose, proceed to the ED for an evaluation or call 911). 25 tablet 2  . omeprazole (PRILOSEC) 40 MG capsule Take 1 capsule (40 mg total) by mouth daily before breakfast. 30 capsule 5  . sevelamer carbonate (RENVELA) 800 MG tablet Take 2 tablets (1,600 mg total) by mouth 3 (three) times daily with meals. 180 tablet 1  . simvastatin (ZOCOR) 20 MG tablet Take 1 tablet (20 mg total) by mouth at bedtime. 90 tablet 3   No current facility-administered medications on file prior to visit.    There are no Patient Instructions on file for this visit. No follow-ups on file.   Kris Hartmann, NP

## 2020-11-09 ENCOUNTER — Inpatient Hospital Stay (HOSPITAL_COMMUNITY)
Admission: EM | Admit: 2020-11-09 | Discharge: 2020-11-11 | DRG: 291 | Disposition: A | Discharge status: Home Health | Payer: Medicare HMO | Attending: Internal Medicine | Admitting: Internal Medicine

## 2020-11-09 ENCOUNTER — Encounter (HOSPITAL_COMMUNITY): Payer: Self-pay | Admitting: *Deleted

## 2020-11-09 ENCOUNTER — Observation Stay (HOSPITAL_COMMUNITY): Payer: Medicare HMO

## 2020-11-09 ENCOUNTER — Other Ambulatory Visit: Payer: Self-pay

## 2020-11-09 ENCOUNTER — Emergency Department (HOSPITAL_COMMUNITY): Payer: Medicare HMO

## 2020-11-09 DIAGNOSIS — F419 Anxiety disorder, unspecified: Secondary | ICD-10-CM | POA: Diagnosis present

## 2020-11-09 DIAGNOSIS — I5032 Chronic diastolic (congestive) heart failure: Secondary | ICD-10-CM | POA: Diagnosis present

## 2020-11-09 DIAGNOSIS — Z833 Family history of diabetes mellitus: Secondary | ICD-10-CM

## 2020-11-09 DIAGNOSIS — R627 Adult failure to thrive: Secondary | ICD-10-CM

## 2020-11-09 DIAGNOSIS — E119 Type 2 diabetes mellitus without complications: Secondary | ICD-10-CM

## 2020-11-09 DIAGNOSIS — I132 Hypertensive heart and chronic kidney disease with heart failure and with stage 5 chronic kidney disease, or end stage renal disease: Secondary | ICD-10-CM | POA: Diagnosis not present

## 2020-11-09 DIAGNOSIS — R4789 Other speech disturbances: Secondary | ICD-10-CM

## 2020-11-09 DIAGNOSIS — J9601 Acute respiratory failure with hypoxia: Secondary | ICD-10-CM | POA: Diagnosis present

## 2020-11-09 DIAGNOSIS — Z8543 Personal history of malignant neoplasm of ovary: Secondary | ICD-10-CM

## 2020-11-09 DIAGNOSIS — Z955 Presence of coronary angioplasty implant and graft: Secondary | ICD-10-CM

## 2020-11-09 DIAGNOSIS — E8889 Other specified metabolic disorders: Secondary | ICD-10-CM | POA: Diagnosis present

## 2020-11-09 DIAGNOSIS — I340 Nonrheumatic mitral (valve) insufficiency: Secondary | ICD-10-CM | POA: Diagnosis present

## 2020-11-09 DIAGNOSIS — Z83438 Family history of other disorder of lipoprotein metabolism and other lipidemia: Secondary | ICD-10-CM

## 2020-11-09 DIAGNOSIS — J810 Acute pulmonary edema: Secondary | ICD-10-CM | POA: Diagnosis present

## 2020-11-09 DIAGNOSIS — L299 Pruritus, unspecified: Secondary | ICD-10-CM | POA: Diagnosis present

## 2020-11-09 DIAGNOSIS — Z7982 Long term (current) use of aspirin: Secondary | ICD-10-CM

## 2020-11-09 DIAGNOSIS — I169 Hypertensive crisis, unspecified: Secondary | ICD-10-CM | POA: Diagnosis present

## 2020-11-09 DIAGNOSIS — I160 Hypertensive urgency: Secondary | ICD-10-CM | POA: Diagnosis present

## 2020-11-09 DIAGNOSIS — I2510 Atherosclerotic heart disease of native coronary artery without angina pectoris: Secondary | ICD-10-CM | POA: Diagnosis present

## 2020-11-09 DIAGNOSIS — Z951 Presence of aortocoronary bypass graft: Secondary | ICD-10-CM

## 2020-11-09 DIAGNOSIS — G253 Myoclonus: Secondary | ICD-10-CM | POA: Diagnosis not present

## 2020-11-09 DIAGNOSIS — N2581 Secondary hyperparathyroidism of renal origin: Secondary | ICD-10-CM | POA: Diagnosis present

## 2020-11-09 DIAGNOSIS — K219 Gastro-esophageal reflux disease without esophagitis: Secondary | ICD-10-CM | POA: Diagnosis present

## 2020-11-09 DIAGNOSIS — D649 Anemia, unspecified: Secondary | ICD-10-CM | POA: Diagnosis present

## 2020-11-09 DIAGNOSIS — N186 End stage renal disease: Secondary | ICD-10-CM | POA: Diagnosis present

## 2020-11-09 DIAGNOSIS — Z91041 Radiographic dye allergy status: Secondary | ICD-10-CM

## 2020-11-09 DIAGNOSIS — Z885 Allergy status to narcotic agent status: Secondary | ICD-10-CM

## 2020-11-09 DIAGNOSIS — Z888 Allergy status to other drugs, medicaments and biological substances status: Secondary | ICD-10-CM

## 2020-11-09 DIAGNOSIS — Z8673 Personal history of transient ischemic attack (TIA), and cerebral infarction without residual deficits: Secondary | ICD-10-CM

## 2020-11-09 DIAGNOSIS — I480 Paroxysmal atrial fibrillation: Secondary | ICD-10-CM | POA: Diagnosis present

## 2020-11-09 DIAGNOSIS — Z880 Allergy status to penicillin: Secondary | ICD-10-CM

## 2020-11-09 DIAGNOSIS — E785 Hyperlipidemia, unspecified: Secondary | ICD-10-CM | POA: Diagnosis present

## 2020-11-09 DIAGNOSIS — Z8249 Family history of ischemic heart disease and other diseases of the circulatory system: Secondary | ICD-10-CM

## 2020-11-09 DIAGNOSIS — I472 Ventricular tachycardia: Secondary | ICD-10-CM | POA: Diagnosis present

## 2020-11-09 DIAGNOSIS — E7800 Pure hypercholesterolemia, unspecified: Secondary | ICD-10-CM | POA: Diagnosis present

## 2020-11-09 DIAGNOSIS — Z79899 Other long term (current) drug therapy: Secondary | ICD-10-CM

## 2020-11-09 DIAGNOSIS — Z9115 Patient's noncompliance with renal dialysis: Secondary | ICD-10-CM

## 2020-11-09 DIAGNOSIS — Z992 Dependence on renal dialysis: Secondary | ICD-10-CM

## 2020-11-09 DIAGNOSIS — E1122 Type 2 diabetes mellitus with diabetic chronic kidney disease: Secondary | ICD-10-CM | POA: Diagnosis present

## 2020-11-09 DIAGNOSIS — I509 Heart failure, unspecified: Secondary | ICD-10-CM

## 2020-11-09 DIAGNOSIS — R262 Difficulty in walking, not elsewhere classified: Secondary | ICD-10-CM | POA: Diagnosis present

## 2020-11-09 DIAGNOSIS — I252 Old myocardial infarction: Secondary | ICD-10-CM

## 2020-11-09 DIAGNOSIS — R531 Weakness: Secondary | ICD-10-CM

## 2020-11-09 DIAGNOSIS — Z8616 Personal history of COVID-19: Secondary | ICD-10-CM

## 2020-11-09 LAB — COMPREHENSIVE METABOLIC PANEL
ALT: 13 U/L (ref 0–44)
AST: 8 U/L — ABNORMAL LOW (ref 15–41)
Albumin: 3.6 g/dL (ref 3.5–5.0)
Alkaline Phosphatase: 80 U/L (ref 38–126)
Anion gap: 13 (ref 5–15)
BUN: 43 mg/dL — ABNORMAL HIGH (ref 8–23)
CO2: 27 mmol/L (ref 22–32)
Calcium: 10.4 mg/dL — ABNORMAL HIGH (ref 8.9–10.3)
Chloride: 99 mmol/L (ref 98–111)
Creatinine, Ser: 9.9 mg/dL — ABNORMAL HIGH (ref 0.44–1.00)
GFR, Estimated: 4 mL/min — ABNORMAL LOW (ref >60)
Glucose, Bld: 88 mg/dL (ref 70–99)
Potassium: 4.7 mmol/L (ref 3.5–5.1)
Sodium: 139 mmol/L (ref 135–145)
Total Bilirubin: 0.6 mg/dL (ref 0.3–1.2)
Total Protein: 7 g/dL (ref 6.5–8.1)

## 2020-11-09 LAB — CBC WITH DIFFERENTIAL/PLATELET
Abs Immature Granulocytes: 0.02 10*3/uL (ref 0.00–0.07)
Basophils Absolute: 0.1 10*3/uL (ref 0.0–0.1)
Basophils Relative: 1 %
Eosinophils Absolute: 0.4 10*3/uL (ref 0.0–0.5)
Eosinophils Relative: 6 %
HCT: 30.5 % — ABNORMAL LOW (ref 36.0–46.0)
Hemoglobin: 8.9 g/dL — ABNORMAL LOW (ref 12.0–15.0)
Immature Granulocytes: 0 %
Lymphocytes Relative: 15 %
Lymphs Abs: 1 10*3/uL (ref 0.7–4.0)
MCH: 36.2 pg — ABNORMAL HIGH (ref 26.0–34.0)
MCHC: 29.2 g/dL — ABNORMAL LOW (ref 30.0–36.0)
MCV: 124 fL — ABNORMAL HIGH (ref 80.0–100.0)
Monocytes Absolute: 0.6 10*3/uL (ref 0.1–1.0)
Monocytes Relative: 8 %
Neutro Abs: 4.6 10*3/uL (ref 1.7–7.7)
Neutrophils Relative %: 70 %
Platelets: 261 10*3/uL (ref 150–400)
RBC: 2.46 MIL/uL — ABNORMAL LOW (ref 3.87–5.11)
RDW: 19.3 % — ABNORMAL HIGH (ref 11.5–15.5)
WBC: 6.6 10*3/uL (ref 4.0–10.5)
nRBC: 0 % (ref 0.0–0.2)

## 2020-11-09 LAB — TSH: TSH: 9.938 u[IU]/mL — ABNORMAL HIGH (ref 0.350–4.500)

## 2020-11-09 LAB — MAGNESIUM: Magnesium: 2.2 mg/dL (ref 1.7–2.4)

## 2020-11-09 LAB — TROPONIN I (HIGH SENSITIVITY): Troponin I (High Sensitivity): 36 ng/L — ABNORMAL HIGH (ref <18)

## 2020-11-09 IMAGING — CT CT HEAD W/O CM
3 series · 16 of 47 positions shown, 19 images · non-contrast
Comparison: [DATE]

CLINICAL DATA: Unsteady 8, bilateral leg weakness for 3 weeks,
posterior headaches

EXAM:
CT HEAD WITHOUT CONTRAST
TECHNIQUE: Contiguous axial images were obtained from the base of the skull
through the vertex without intravenous contrast.

[Series 2: head w o · axial · 0.41mm/px · z∈[+103,+228]mm · 10 of 31 slices shown, 13 images]
[im 3/31  brain]
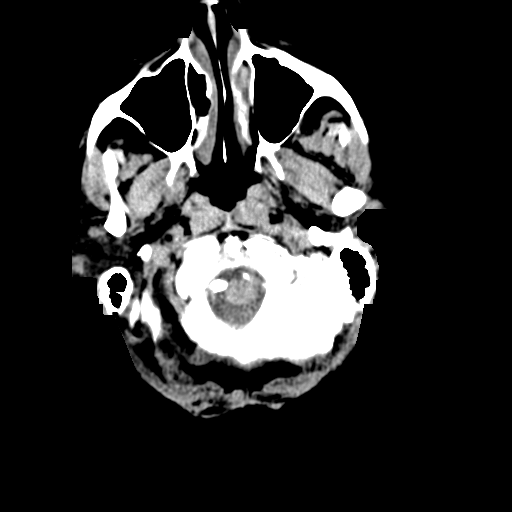
[im 3/31  bone]
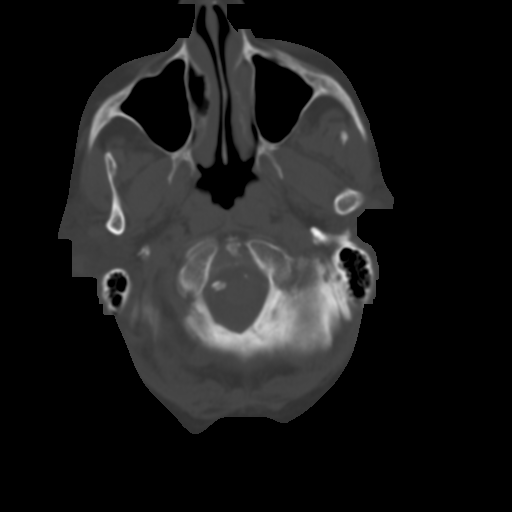
[im 6/31  brain]
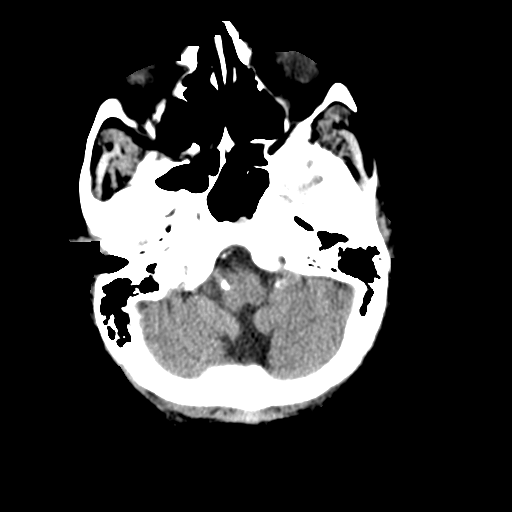
[im 9/31  brain]
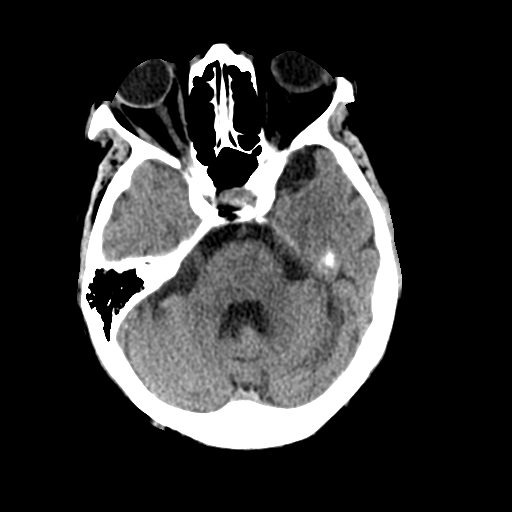
[im 11/31  brain]
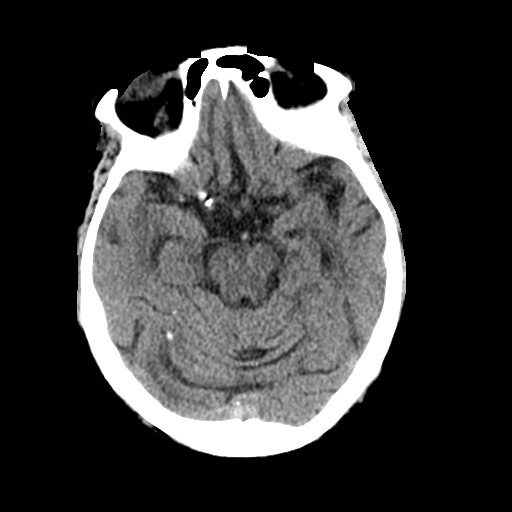
[im 14/31  brain]
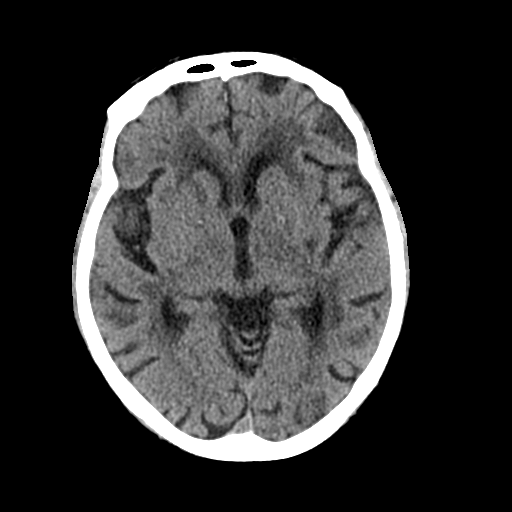
[im 14/31  bone]
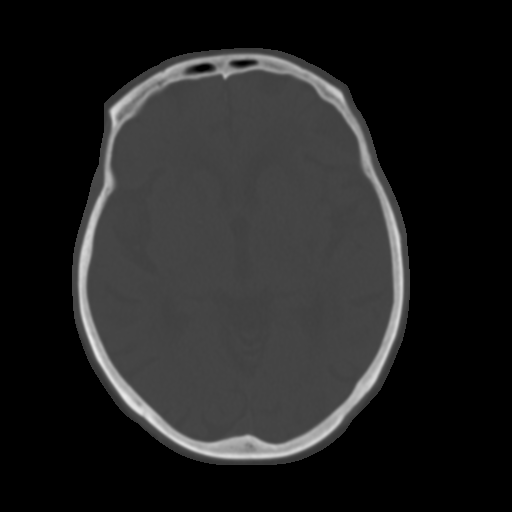
[im 17/31  brain]
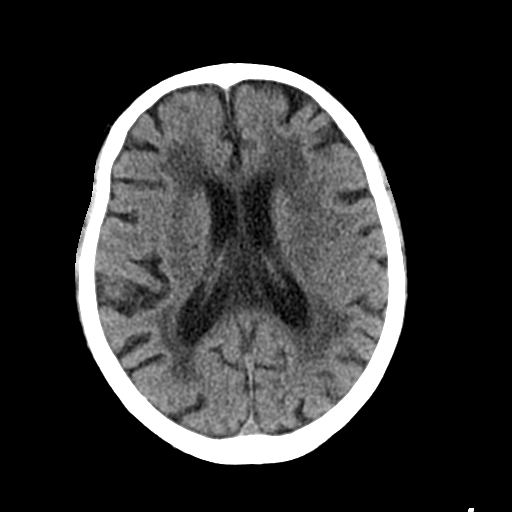
[im 20/31  brain]
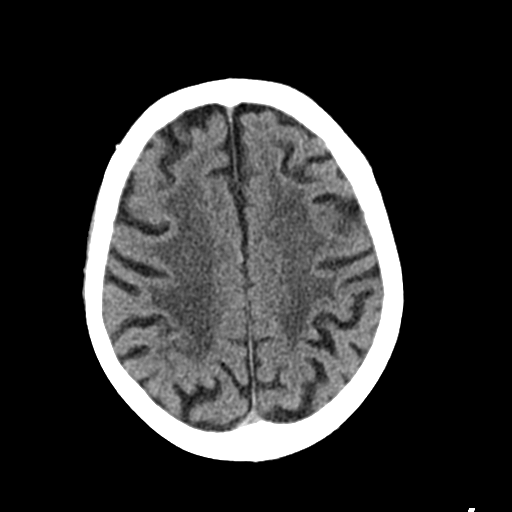
[im 23/31  brain]
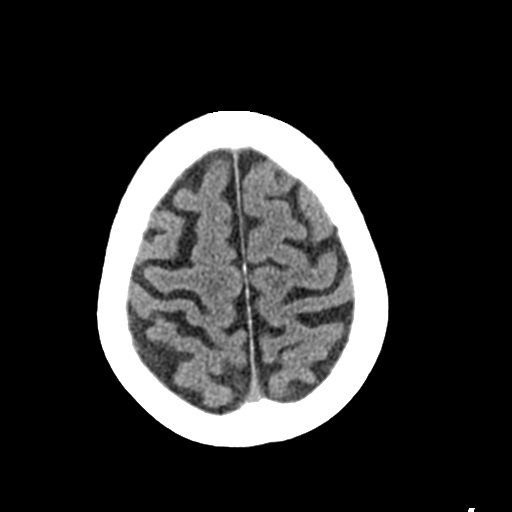
[im 25/31  brain]
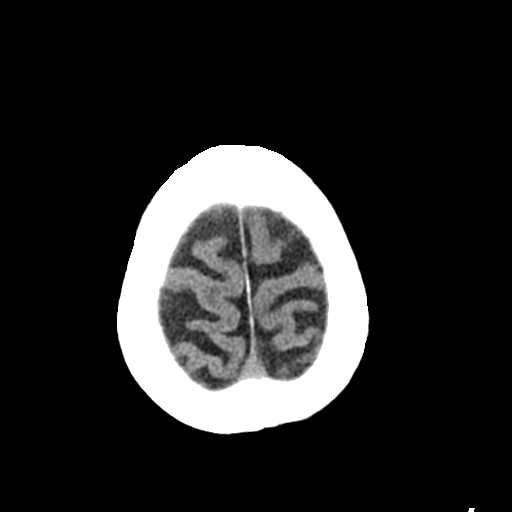
[im 25/31  bone]
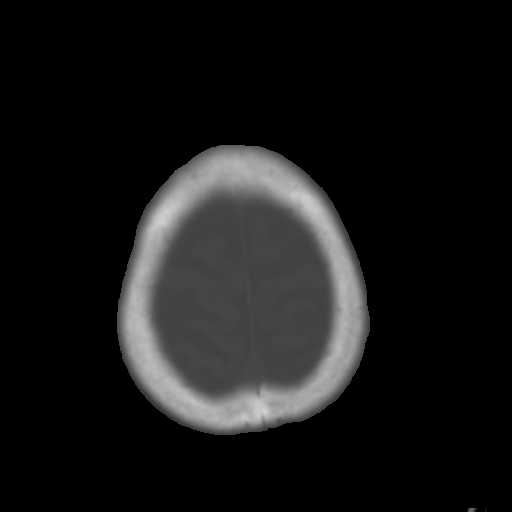
[im 28/31  brain]
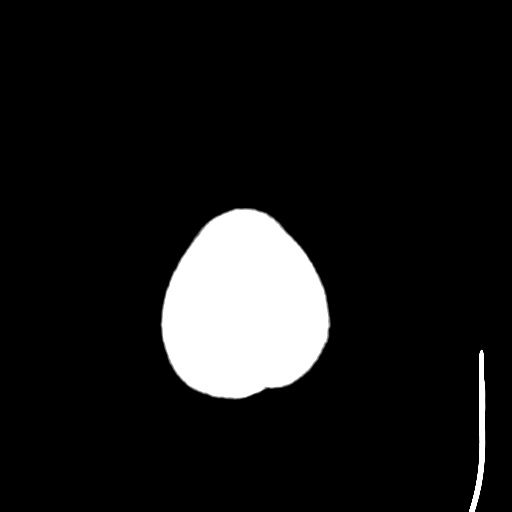

[Series 4: coronal soft · coronal · 0.33mm/px · 3 of 67 slices shown]
[im 23/67  brain]
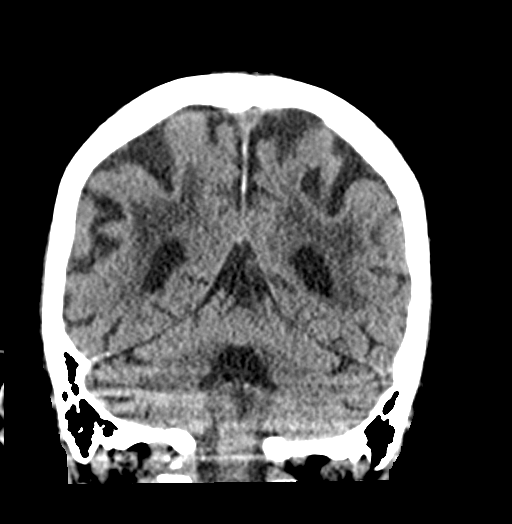
[im 30/67  brain]
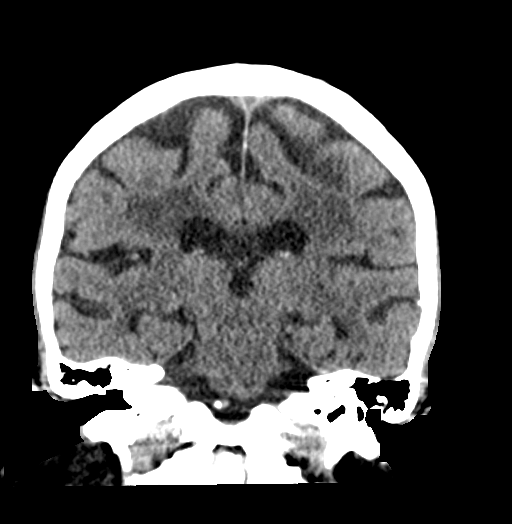
[im 37/67  brain]
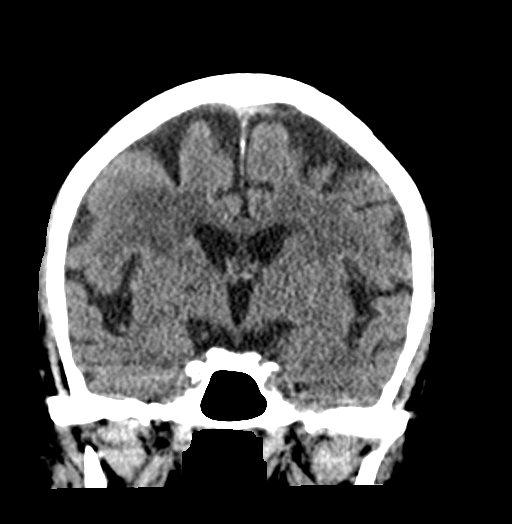

[Series 5: sagittal soft · sagittal · 0.32mm/px · 3 of 54 slices shown]
[im 18/54  brain]
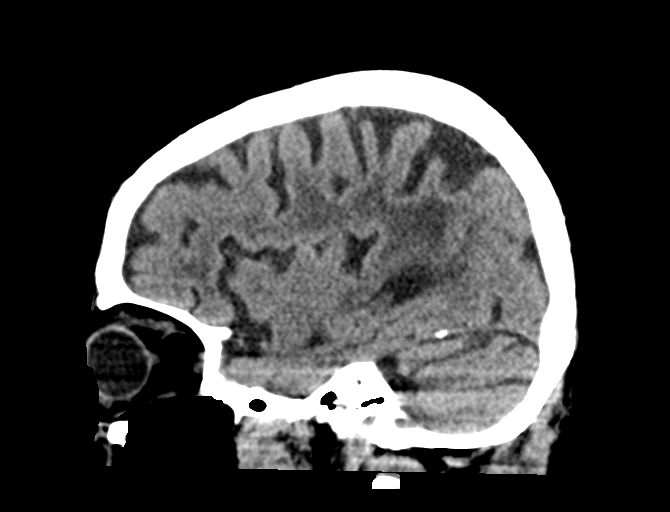
[im 27/54  brain]
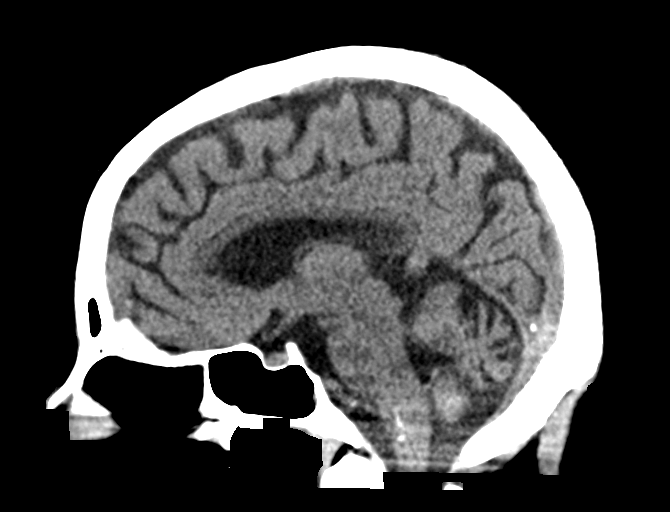
[im 36/54  brain]
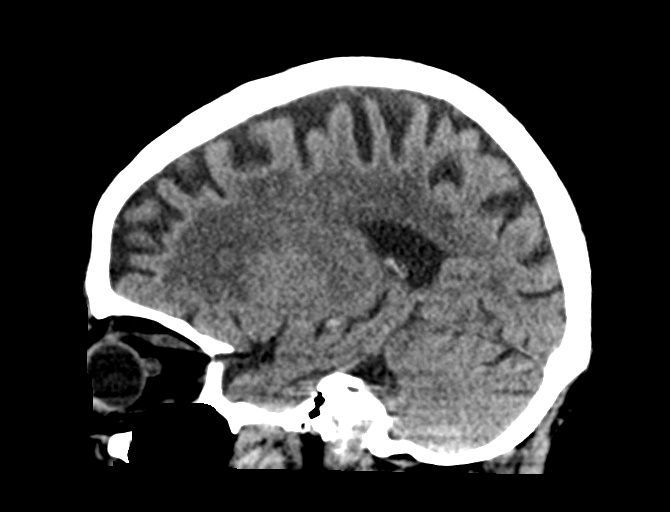

[16 of 47 positions shown; findings below may reference images not displayed]

FINDINGS: Brain: Stable confluent hypodensities throughout the periventricular
white matter compatible with chronic small vessel ischemic change.
Chronic lacunar infarct left basal ganglia. No signs of acute
infarct or hemorrhage. Lateral ventricles and remaining midline
structures are unremarkable. No acute extra-axial fluid collections.
No mass effect.

Vascular: Stable diffuse atherosclerosis.  No hyperdense vessel.

Skull: Normal. Negative for fracture or focal lesion.

Sinuses/Orbits: No acute finding.

Other: None.
IMPRESSION: 1. Stable chronic small vessel ischemic changes. No acute
intracranial process.

## 2020-11-09 IMAGING — DX DG CHEST 1V PORT
1 series · 1 of 1 positions shown · non-contrast
Comparison: [DATE], [DATE] CT [DATE]

CLINICAL DATA: Increased shortness of breath

EXAM:
PORTABLE CHEST 1 VIEW

[chest ap]
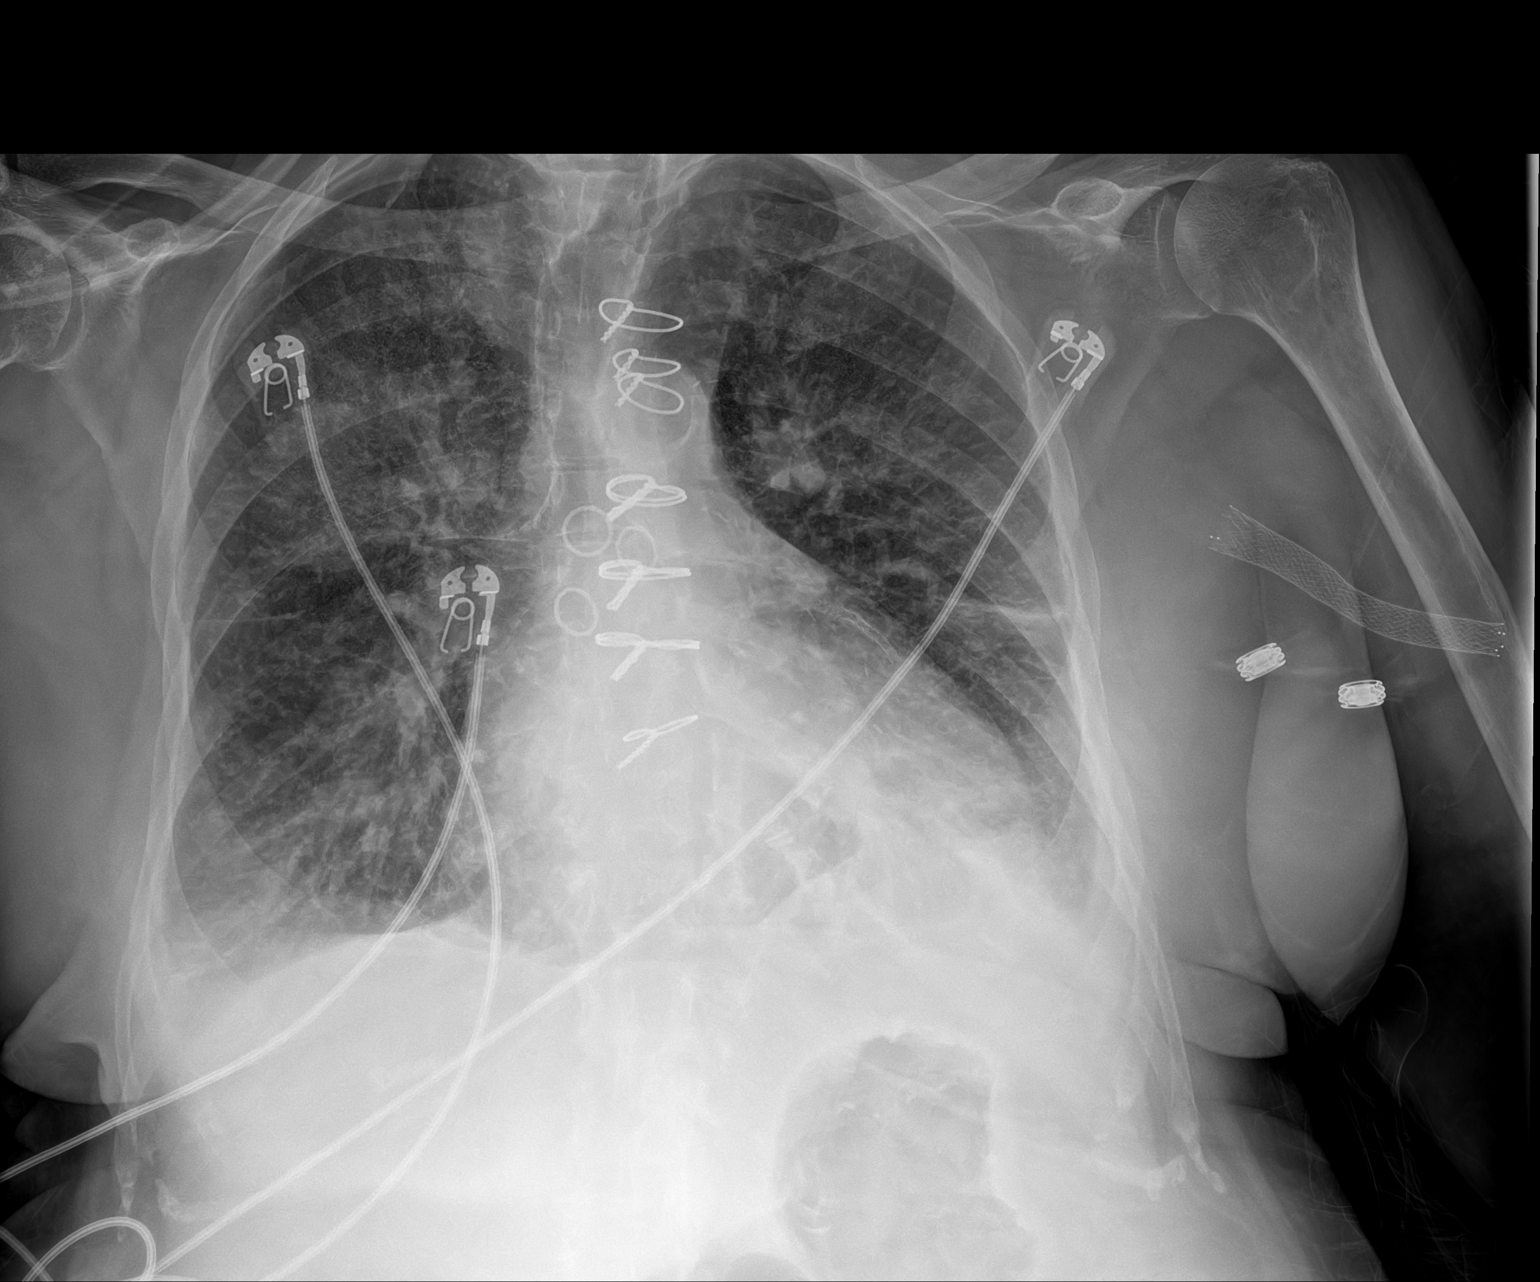

[1 of 1 positions shown; findings below may reference images not displayed]

FINDINGS: Post sternotomy changes. Cardiomegaly with vascular congestion and
increased pulmonary edema. Small bilateral pleural effusions which
appears slightly increased. Atelectasis versus mild infiltrate left
lung base. No pneumothorax. Vascular stent in the left upper arm
IMPRESSION: 1. Cardiomegaly with increased small pleural effusions, worsened
vascular congestion and development of interstitial pulmonary edema.
2. Atelectasis versus developing infiltrate at the left base.

## 2020-11-09 IMAGING — DX DG CHEST 1V PORT
1 series · 1 of 1 positions shown · non-contrast
Comparison: [DATE]

CLINICAL DATA: Cough for several days

EXAM:
PORTABLE CHEST 1 VIEW

[chest ap]
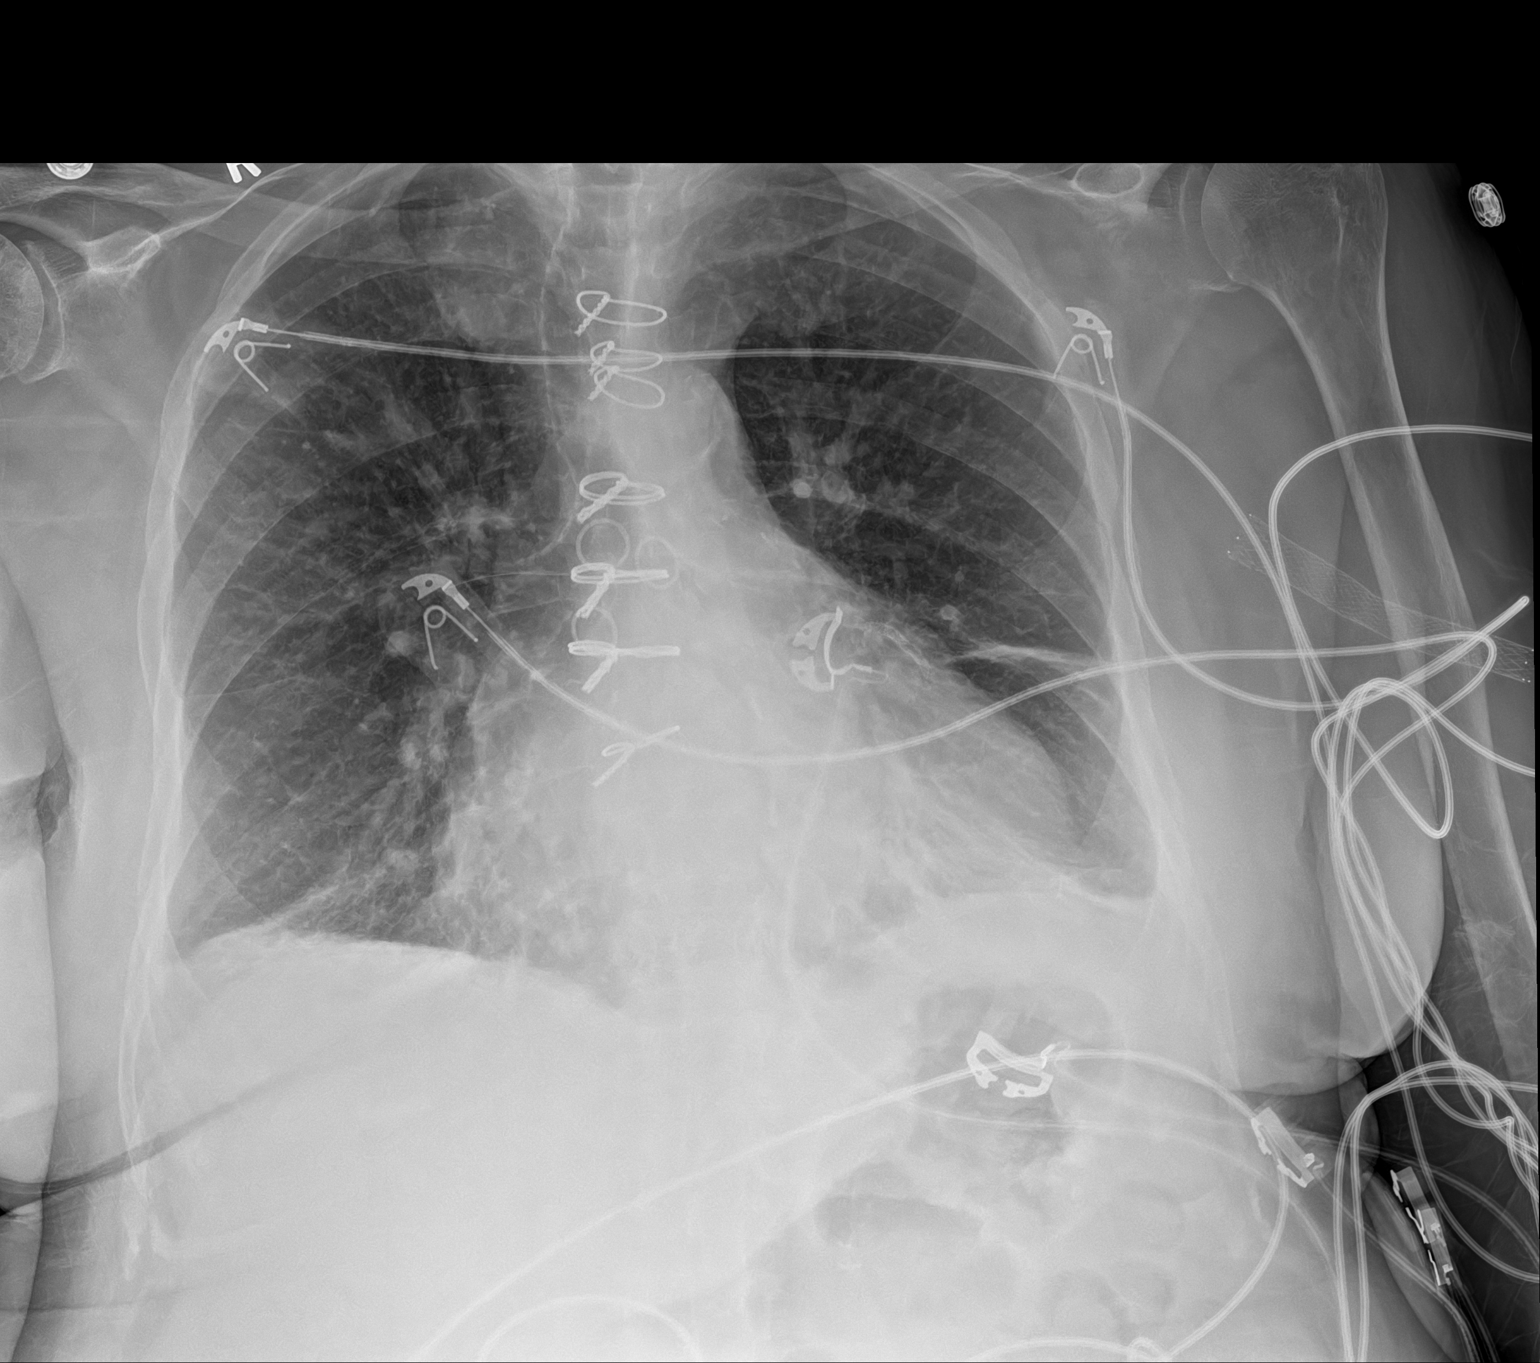

[1 of 1 positions shown; findings below may reference images not displayed]

FINDINGS: Cardiac shadow is enlarged but stable. Postsurgical changes are
noted. Scarring is noted in the left base and left mid lung stable
from the prior exam. Right basilar scarring is noted as well. Mild
vascular congestion is noted but improved from the prior study. No
significant edema is seen. Vascular stenting is noted in the left
arm.
IMPRESSION: Stable scarring bilaterally.

Mild vascular congestion likely related to a degree of volume
overload.

## 2020-11-09 MED ORDER — STROKE: EARLY STAGES OF RECOVERY BOOK: Freq: Once | Status: AC

## 2020-11-09 MED ORDER — HYDRALAZINE HCL 20 MG/ML IJ SOLN
20.0000 mg | Freq: Once | INTRAMUSCULAR | Status: AC
  Administered 2020-11-09: 20 mg via INTRAVENOUS
  Filled 2020-11-09: qty 1

## 2020-11-09 MED ORDER — RENA-VITE PO TABS
1.0000 | ORAL_TABLET | Freq: Every day | ORAL | Status: DC
  Administered 2020-11-11: 1 via ORAL
  Filled 2020-11-09 (×2): qty 1

## 2020-11-09 MED ORDER — PANTOPRAZOLE SODIUM 40 MG PO TBEC
40.0000 mg | DELAYED_RELEASE_TABLET | Freq: Every day | ORAL | Status: DC
  Administered 2020-11-10 – 2020-11-11 (×2): 40 mg via ORAL
  Filled 2020-11-09 (×2): qty 1

## 2020-11-09 MED ORDER — HEPARIN SODIUM (PORCINE) 5000 UNIT/ML IJ SOLN
5000.0000 [IU] | Freq: Three times a day (TID) | INTRAMUSCULAR | Status: DC
  Administered 2020-11-10 – 2020-11-11 (×5): 5000 [IU] via SUBCUTANEOUS
  Filled 2020-11-09 (×4): qty 1

## 2020-11-09 MED ORDER — SENNOSIDES-DOCUSATE SODIUM 8.6-50 MG PO TABS: 1.0000 | ORAL_TABLET | Freq: Every evening | ORAL | Status: DC | PRN

## 2020-11-09 MED ORDER — ASPIRIN EC 81 MG PO TBEC
81.0000 mg | DELAYED_RELEASE_TABLET | Freq: Every day | ORAL | Status: DC
  Administered 2020-11-10 – 2020-11-11 (×2): 81 mg via ORAL
  Filled 2020-11-09 (×2): qty 1

## 2020-11-09 MED ORDER — ISOSORBIDE MONONITRATE ER 60 MG PO TB24
60.0000 mg | ORAL_TABLET | Freq: Every day | ORAL | Status: DC
  Administered 2020-11-10 – 2020-11-11 (×2): 60 mg via ORAL
  Filled 2020-11-09 (×2): qty 1

## 2020-11-09 MED ORDER — ACETAMINOPHEN 325 MG PO TABS: 650.0000 mg | ORAL_TABLET | ORAL | Status: DC | PRN

## 2020-11-09 MED ORDER — AMIODARONE HCL 200 MG PO TABS
200.0000 mg | ORAL_TABLET | Freq: Every day | ORAL | Status: DC
  Administered 2020-11-10 – 2020-11-11 (×2): 200 mg via ORAL
  Filled 2020-11-09 (×2): qty 1

## 2020-11-09 MED ORDER — SIMVASTATIN 20 MG PO TABS
20.0000 mg | ORAL_TABLET | Freq: Every day | ORAL | Status: DC
  Administered 2020-11-10: 20 mg via ORAL
  Filled 2020-11-09: qty 1

## 2020-11-09 MED ORDER — CLONAZEPAM 0.25 MG PO TBDP
0.2500 mg | ORAL_TABLET | Freq: Two times a day (BID) | ORAL | Status: DC | PRN
  Administered 2020-11-10: 0.25 mg via ORAL
  Filled 2020-11-09: qty 1

## 2020-11-09 MED ORDER — ACETAMINOPHEN 160 MG/5ML PO SOLN: 650.0000 mg | ORAL | Status: DC | PRN

## 2020-11-09 MED ORDER — GABAPENTIN 100 MG PO CAPS
100.0000 mg | ORAL_CAPSULE | Freq: Every day | ORAL | Status: DC
  Filled 2020-11-09: qty 1

## 2020-11-09 MED ORDER — DIPHENHYDRAMINE HCL 50 MG/ML IJ SOLN: 25.0000 mg | Freq: Four times a day (QID) | INTRAMUSCULAR | Status: DC | PRN

## 2020-11-09 MED ORDER — CHLORHEXIDINE GLUCONATE CLOTH 2 % EX PADS
6.0000 | MEDICATED_PAD | Freq: Every day | CUTANEOUS | Status: DC
  Administered 2020-11-11: 6 via TOPICAL

## 2020-11-09 MED ORDER — FLUTICASONE PROPIONATE 50 MCG/ACT NA SUSP: 1.0000 | Freq: Every evening | NASAL | Status: DC | PRN

## 2020-11-09 MED ORDER — MORPHINE SULFATE (PF) 2 MG/ML IV SOLN: 2.0000 mg | INTRAVENOUS | Status: DC | PRN

## 2020-11-09 MED ORDER — SEVELAMER CARBONATE 800 MG PO TABS
1600.0000 mg | ORAL_TABLET | Freq: Three times a day (TID) | ORAL | Status: DC
  Administered 2020-11-10 – 2020-11-11 (×3): 1600 mg via ORAL
  Filled 2020-11-09 (×4): qty 2

## 2020-11-09 MED ORDER — ACETAMINOPHEN 650 MG RE SUPP: 650.0000 mg | RECTAL | Status: DC | PRN

## 2020-11-09 NOTE — ED Notes (Signed)
Attempted IV x2. 

## 2020-11-09 NOTE — ED Notes (Signed)
Pt taken to exams.  

## 2020-11-09 NOTE — Progress Notes (Signed)
Patient arrived on unit from the ED and began c/o CP, SOB and headache.  Oxygen saturation showing 92% on RA, but patient demonstrating increased WOB,prolonged expirations, and unable to speak in complete sentences.  Crackles noted to bilateral lower bases and scattered expiratory wheezes noted throughout. Patient placed on 2L [place] with an increase on oxygen saturation to 94-96%.  EKG obtained.

## 2020-11-09 NOTE — ED Provider Notes (Signed)
MSE was initiated and I personally evaluated the patient and placed orders (if any) at  1:58 PM on Nov 09, 2020.  The patient appears stable so that the remainder of the MSE may be completed by another provider.   Chief Complaint:  Jerking movements, weakness   HPI:   Patient states that over the past week or so she has noticed that she started having jerking movements in her hands and her bilateral legs.  States that its been difficult for her to walk over the last week as well, worsening over the last 3 days.  States that her legs feel weak.  Denies any trauma to them.  Denies any fevers.   ROS:  Weakness, jerking movements   Physical Exam:  Gen:                Awake, no distress  HEENT:          Atraumatic  Resp:              Normal effort  Cardiac:          Normal rate  Abd:                Nondistended, nontender  MSK:              Moves extremities without difficulty  Neuro:            Speech clear,EOMS intact, no facial droop, normal coordination     Initiation of care has begun. The patient has been counseled on the process, plan, and necessity for staying for the completion/evaluation, and the remainder of the medical screening examination    Alfredia Client, PA-C 11/09/20 [id number]    Fredia Sorrow, MD 11/10/20 [phone]

## 2020-11-09 NOTE — ED Triage Notes (Signed)
Weakness in legs x 3 days

## 2020-11-09 NOTE — ED Notes (Signed)
Nurse will return call for report

## 2020-11-09 NOTE — ED Provider Notes (Signed)
Emergency Department Provider Note   I have reviewed the triage vital signs and the nursing notes.   HISTORY  Chief Complaint Weakness   HPI Shawna [name] is a 80 y.o. female with past medical history reviewed below presents to the emergency department with generalized weakness and jerking movements.  Symptoms have been ongoing for the past 2 days.  She denies any numbness or unilateral weakness.  No back pain, headache, chest pain.  She does have some cough with congestion mainly in the mornings.  No new medications.  Denies abdominal pain, vomiting, diarrhea.  She states that the jerking movements and weakness are keeping her from walking and getting around.  She has significant difficulty due to this.  She has been compliant with her HD overall but did miss today's session due to symptoms. Also endorses some brain fog and word finding difficulty that seems new.    Past Medical History:  Diagnosis Date  . Acute on chronic respiratory failure with hypoxia ([place]) 10/10/2016  . Anxiety   . Arthritis   . AVM (arteriovenous malformation) of colon   . CAD (coronary artery disease)    a. s/p CABG in 2013 b. DES to D1 in 10/2016. c. cath in 07/2018 showing patent grafts with occlusion of D1 at prior stent site and progression of PDA disease --> medical management recommended  . Carotid artery disease ([place])    a. 09-47% LICA, 0/9628   . Chronic anemia   . Chronic bronchitis ([place])   . Chronic diastolic CHF (congestive heart failure) ([place])    a. 02/2012 Echo EF 60-65%, nl wall motion, Gr 1 DD, mod MR  . Colon cancer ([place]) 1992  . Esophageal stricture   . ESRD on hemodialysis ([place])    ESRD due to HTN, started dialysis 2011 and gets HD at [facility] with Dr Hinda Lenis on MWF schedule.  Access is LUA AVF as of Sept 2014.   Marland Kitchen GERD (gastroesophageal reflux disease)   . High cholesterol 12/2011  . History of blood transfusion 07/2011; 12/2011; 01/2012 X 2; 04/2012  . History of gout   . History of  lower GI bleeding   . Hypertension   . Iron deficiency anemia   . Mitral regurgitation    a. Moderate by echo, 02/2012  . Myocardial infarction ([place])   . NSVT (nonsustained ventricular tachycardia) ([place])   . Ovarian cancer ([place]) 1992  . PAF (paroxysmal atrial fibrillation) ([place])   . Pneumonia ~ 2009  . PUD (peptic ulcer disease)   . TIA (transient ischemic attack)     Patient Active Problem List   Diagnosis Date Noted  . Diabetes mellitus type 2 in nonobese ([place]) 11/10/2020  . Acute pulmonary edema ([place]) 11/10/2020  . Generalized weakness 11/09/2020  . Dialysis AV fistula malfunction, initial encounter ([place])   . Jugular vein occlusion, right ([place])   . Failure of surgically constructed arteriovenous fistula ([place]) 10/03/2020  . Myoclonus 08/31/2020  . Clotted renal dialysis AV graft, initial encounter ([place])   . Hypertensive heart and chronic kidney disease with heart failure and stage 1 through stage 4 chronic kidney disease, or chronic kidney disease ([place])   . Hemodialysis-associated hypotension   . Acute hypoxemic respiratory failure ([place]) 06/14/2020  . Hypertension 06/12/2020  . Irritable bowel syndrome 02/25/2020  . Adenomatous duodenal polyp 09/10/2019  . History of GI bleed 09/10/2019  . Angina pectoris ([place]) 06/05/2019  . Chest pain 06/03/2019  . Small intestinal bacterial overgrowth 05/14/2019  .  Iron deficiency anemia 04/02/2019  . GI bleed 03/08/2019  . Gastrointestinal hemorrhage with melena 03/06/2019  . Acute respiratory failure with hypoxia ([place]) 12/25/2018  . Elevated troponin 12/14/2018  . Chest pain at rest 07/13/2018  . Hand steal syndrome ([place]) 08/01/2017  . Macrocytic anemia 07/14/2017  . Coronary artery disease 06/05/2017  . Mesenteric ischemia ([place])   . Diverticulitis   . Enteritis   . Complication of vascular access for dialysis 03/19/2017  . Preoperative clearance 01/25/2017  . H/O non-ST elevation myocardial infarction (NSTEMI) 10/24/2016  .  Fluid overload 10/10/2016  . Complication from renal dialysis device 10/10/2016  . Non-ST elevation MI (NSTEMI) ([place])   . Encounter for fitting and adjustment of vascular catheter   . Heme positive stool   . Demand ischemia ([place]) 07/27/2016  . Hypertensive emergency 07/08/2016  . Acute on chronic respiratory failure with hypoxia ([place])   . Cardiac arrest ([place])   . Palliative care encounter   . Goals of care, counseling/discussion   . Hypertensive crisis without congestive heart failure 05/09/2016  . Flash pulmonary edema ([place]) 04/06/2016  . Acute respiratory failure ([place]) 04/06/2016  . Hypertensive crisis 01/27/2016  . History of colon cancer 01/27/2016  . History of ovarian cancer 01/27/2016  . Hypertensive urgency 01/27/2016  . Paroxysmal atrial fibrillation ([place]) 10/14/2015  . Coronary angioplasty status 10/14/2015  . Malignant neoplasm of right ovary ([place]) 10/14/2015  . Narrow complex tachycardia ([place]) 09/08/2015  . SVT (supraventricular tachycardia) ([place]) 09/08/2015  . Influenza A 08/30/2015  . Acute on chronic diastolic CHF (congestive heart failure) ([place]) 05/04/2015  . Unstable angina ([place]) 05/03/2015  . DOE (dyspnea on exertion)   . Essential hypertension   . Pain in joint, lower leg 08/14/2014  . Dacryocystitis 05/29/2013  . Chronic diastolic CHF (congestive heart failure) ([place]) 03/22/2013  . GI bleeding 03/21/2013  . Acute blood loss anemia 03/21/2013  . Occlusion and stenosis of carotid artery without mention of cerebral infarction 01/24/2013  . Hx of CABG 07/05/2012  . Carotid artery disease ([place]) 07/05/2012  . Mitral regurgitation 06/12/2012  . Pneumonia 06/09/2012  . Non-STEMI (non-ST elevated myocardial infarction) ([place]) 06/08/2012  . Ischemic chest pain ([place]) 03/01/2012  . 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 GERD (gastroesophageal reflux disease) 01/09/2012  . HLD (hyperlipidemia) 01/05/2012  . Atherosclerotic heart disease  of native coronary artery without angina pectoris 12/16/2011  . Essential hypertension, benign 12/16/2011  . ESRD on hemodialysis ([place]) 12/16/2011  . Anxiety disorder 05/04/2011  . Anemia in chronic kidney disease 04/29/2011  . ESRD (end stage renal disease) on dialysis ([place]) 04/29/2011  . Gout 04/29/2011  . Hypertensive chronic kidney disease with stage 5 chronic kidney disease or end stage renal disease ([place]) 04/29/2011    Past Surgical History:  Procedure Laterality Date  . A/V SHUNTOGRAM Left 03/19/2019   Procedure: A/V SHUNTOGRAM;  Surgeon: Katha Cabal, MD;  Location: [place] [facility] LAB;  Service: Cardiovascular;  Laterality: Left;  . ABDOMINAL HYSTERECTOMY  1992  . APPENDECTOMY  06/1990  . AV FISTULA PLACEMENT  07/2009   left upper arm  . AV FISTULA PLACEMENT Right 09/06/2016   Procedure: RIGHT FOREARM ARTERIOVENOUS (AV) GRAFT;  Surgeon: Elam Dutch, MD;  [facility]: [facility] OR;  Service: Vascular;  Laterality: Right;  . AV FISTULA PLACEMENT N/A 02/24/2017   Procedure: INSERTION OF ARTERIOVENOUS (AV) GORE-TEX GRAFT ARM (BRACHIAL AXILLARY);  Surgeon: Katha Cabal, MD;  Location: [facility] ORS;  Service: Vascular;  Laterality: N/A;  .  [place] REMOVAL Right 09/06/2016   Procedure: REMOVAL OF Right Arm ARTERIOVENOUS GORETEX GRAFT and Vein Patch angioplasty of brachial artery;  Surgeon: Angelia Mould, MD;  Location: [place];  Service: Vascular;  Laterality: Right;  . BIOPSY  09/26/2019   Procedure: BIOPSY;  Surgeon: Rogene Houston, MD;  [facility];  Service: Endoscopy;;  . COLON RESECTION  1992  . COLON SURGERY    . COLONOSCOPY N/A 03/09/2019   Procedure: COLONOSCOPY;  Surgeon: Rogene Houston, MD;  [facility];  Service: Endoscopy;  Laterality: N/A;  . CORONARY ANGIOPLASTY WITH STENT PLACEMENT  12/15/11   "2"  . CORONARY ANGIOPLASTY WITH STENT PLACEMENT  y/2013   "1; makes total of 3" (05/02/2012)  . CORONARY ARTERY BYPASS GRAFT  06/13/2012    Procedure: CORONARY ARTERY BYPASS GRAFTING (CABG);  Surgeon: Grace Isaac, MD;  Location: [place];  Service: Open Heart Surgery;  Laterality: N/A;  cabg x four;  using left internal mammary artery, and left leg greater saphenous vein harvested endoscopically  . CORONARY STENT INTERVENTION N/A 10/13/2016   Procedure: Coronary Stent Intervention;  Surgeon: Troy Sine, MD;  Location: [place] [facility] LAB;  Service: Cardiovascular;  Laterality: N/A;  . DIALYSIS/PERMA CATHETER REMOVAL N/A 04/18/2017   Procedure: DIALYSIS/PERMA CATHETER REMOVAL;  Surgeon: Katha Cabal, MD;  Location: [place] [facility] LAB;  Service: Cardiovascular;  Laterality: N/A;  . DILATION AND CURETTAGE OF UTERUS    . ESOPHAGOGASTRODUODENOSCOPY  01/20/2012   Procedure: ESOPHAGOGASTRODUODENOSCOPY (EGD);  Surgeon: Ladene Artist, MD,FACG;  Location: [facility] ENDOSCOPY;  Service: Endoscopy;  Laterality: N/A;  . ESOPHAGOGASTRODUODENOSCOPY N/A 03/26/2013   Procedure: ESOPHAGOGASTRODUODENOSCOPY (EGD);  Surgeon: Irene Shipper, MD;  Location: [facility] ENDOSCOPY;  Service: Endoscopy;  Laterality: N/A;  . ESOPHAGOGASTRODUODENOSCOPY N/A 04/30/2015   Procedure: ESOPHAGOGASTRODUODENOSCOPY (EGD);  Surgeon: Rogene Houston, MD;  [facility];  Service: Endoscopy;  Laterality: N/A;  1pm - moved to 10/20 @ 1:10  . ESOPHAGOGASTRODUODENOSCOPY N/A 07/29/2016   Procedure: ESOPHAGOGASTRODUODENOSCOPY (EGD);  Surgeon: Manus Gunning, MD;  Location: [place];  Service: Gastroenterology;  Laterality: N/A;  enteroscopy  . ESOPHAGOGASTRODUODENOSCOPY N/A 09/26/2019   Procedure: ESOPHAGOGASTRODUODENOSCOPY (EGD);  Surgeon: Rogene Houston, MD;  [facility];  Service: Endoscopy;  Laterality: N/A;  1250  . GIVENS CAPSULE STUDY N/A 03/07/2019   Procedure: GIVENS CAPSULE STUDY;  Surgeon: Rogene Houston, MD;  [facility];  Service: Endoscopy;  Laterality: N/A;  7:30  . INSERTION OF DIALYSIS CATHETER N/A 10/05/2020   Procedure:  ABORTED TUNNELED DIALYSIS CATHETER PLACEMENT RIGHT INTERNAL JUGULAR VEIN ;  Surgeon: Virl Cagey, MD;  Location: [facility];  Service: General;  Laterality: N/A;  . INTRAOPERATIVE TRANSESOPHAGEAL ECHOCARDIOGRAM  06/13/2012   Procedure: INTRAOPERATIVE TRANSESOPHAGEAL ECHOCARDIOGRAM;  Surgeon: Grace Isaac, MD;  Location: [place];  Service: Open Heart Surgery;  Laterality: N/A;  . IR DIALY SHUNT INTRO NEEDLE/INTRACATH INITIAL W/IMG LEFT Left 10/06/2020  . IR FLUORO GUIDE CV LINE RIGHT  06/17/2020  . IR GENERIC HISTORICAL  07/26/2016   IR FLUORO GUIDE CV LINE RIGHT 07/26/2016 Greggory Keen, MD [facility]INTER[facility]  . IR GENERIC HISTORICAL  07/26/2016   IR US GUIDE VASC ACCESS RIGHT 07/26/2016 Greggory Keen, MD [facility]  . IR GENERIC HISTORICAL  08/02/2016   IR US GUIDE VASC ACCESS RIGHT 08/02/2016 Greggory Keen, MD [facility]  . IR GENERIC HISTORICAL  08/02/2016   IR FLUORO GUIDE CV LINE RIGHT 08/02/2016 Greggory Keen, MD  [facility]  . IR RADIOLOGY PERIPHERAL GUIDED IV START  03/28/2017  . IR REMOVAL TUN CV CATH W/O FL  08/11/2020  . IR THROMBECTOMY AV FISTULA W/THROMBOLYSIS INC/SHUNT/IMG LEFT Left 06/17/2020  . IR US GUIDE VASC ACCESS LEFT  06/17/2020  . IR US GUIDE VASC ACCESS RIGHT  03/28/2017  . IR US GUIDE VASC ACCESS RIGHT  06/17/2020  . LEFT HEART CATH AND CORONARY ANGIOGRAPHY N/A 09/20/2016   Procedure: Left Heart Cath and Coronary Angiography;  Surgeon: [name], MD;  Location: [place] CV LAB;  Service: Cardiovascular;  Laterality: N/A;  . LEFT HEART CATH AND CORS/GRAFTS ANGIOGRAPHY N/A 10/13/2016   Procedure: Left Heart Cath and Cors/Grafts Angiography;  Surgeon: Troy Sine, MD;  Location: [place] [facility] LAB;  Service: Cardiovascular;  Laterality: N/A;  . LEFT HEART CATH AND CORS/GRAFTS ANGIOGRAPHY N/A 07/13/2018   Procedure: LEFT HEART CATH AND CORS/GRAFTS ANGIOGRAPHY;  Surgeon: Martinique, Peter M, MD;  Location: [place] [facility] LAB;  Service: Cardiovascular;  Laterality: N/A;  . LEFT  HEART CATHETERIZATION WITH CORONARY ANGIOGRAM N/A 12/15/2011   Procedure: LEFT HEART CATHETERIZATION WITH CORONARY ANGIOGRAM;  Surgeon: Burnell Blanks, MD;  Location: [facility] CATH LAB;  Service: Cardiovascular;  Laterality: N/A;  . LEFT HEART CATHETERIZATION WITH CORONARY ANGIOGRAM N/A 01/10/2012   Procedure: LEFT HEART CATHETERIZATION WITH CORONARY ANGIOGRAM;  Surgeon: Peter M Martinique, MD;  Location: [facility] CATH LAB;  Service: Cardiovascular;  Laterality: N/A;  . LEFT HEART CATHETERIZATION WITH CORONARY ANGIOGRAM N/A 06/08/2012   Procedure: LEFT HEART CATHETERIZATION WITH CORONARY ANGIOGRAM;  Surgeon: Burnell Blanks, MD;  Location: [facility] CATH LAB;  Service: Cardiovascular;  Laterality: N/A;  . LEFT HEART CATHETERIZATION WITH CORONARY/GRAFT ANGIOGRAM N/A 12/10/2013   Procedure: LEFT HEART CATHETERIZATION WITH Beatrix Fetters;  Surgeon: [name], MD;  Location: [facility] CATH LAB;  Service: Cardiovascular;  Laterality: N/A;  . OVARY SURGERY     ovarian cancer  . POLYPECTOMY  03/09/2019   Procedure: POLYPECTOMY;  Surgeon: Rogene Houston, MD;  [facility];  Service: Endoscopy;;  cecal   . POLYPECTOMY N/A 09/26/2019   Procedure: DUODENAL POLYPECTOMY;  Surgeon: Rogene Houston, MD;  [facility];  Service: Endoscopy;  Laterality: N/A;  . REVISION OF ARTERIOVENOUS GORETEX GRAFT N/A 02/24/2017   Procedure: REVISION OF ARTERIOVENOUS GORETEX GRAFT (RESECTION);  Surgeon: Katha Cabal, MD;  Location: [facility] ORS;  Service: Vascular;  Laterality: N/A;  . REVISON OF ARTERIOVENOUS FISTULA Left 06/19/2020   Procedure: REVISION OF LEFT UPPER ARM AV GRAFT WITH INTERPOSITION JUMP GRAFT USING 6MM GORE LIMB;  Surgeon: Marty Heck, MD;  Location: [place];  Service: Vascular;  Laterality: Left;  . SHUNTOGRAM N/A 10/15/2013   Procedure: Fistulogram;  Surgeon: Serafina Mitchell, MD;  Location: [facility] CATH LAB;  Service: Cardiovascular;  Laterality: N/A;  . THROMBECTOMY / ARTERIOVENOUS  GRAFT REVISION  2011   left upper arm  . TUBAL LIGATION  1980's  . UPPER EXTREMITY ANGIOGRAPHY Bilateral 12/06/2016   Procedure: Upper Extremity Angiography;  Surgeon: Katha Cabal, MD;  Location: [place] [facility] LAB;  Service: Cardiovascular;  Laterality: Bilateral;  . UPPER EXTREMITY INTERVENTION Left 06/06/2017   Procedure: UPPER EXTREMITY INTERVENTION;  Surgeon: Katha Cabal, MD;  Location: [place] CV LAB;  Service: Cardiovascular;  Laterality: Left;    Allergies Aspirin, Penicillins, Amlodipine, Bactrim [sulfamethoxazole-trimethoprim], Contrast media [iodinated diagnostic agents], Iron, Nitrofurantoin, Tylenol [acetaminophen], Gabapentin, Hydralazine, Levofloxacin, No known allergies, Ranexa [ranolazine], Dexilant [dexlansoprazole], Levaquin [levofloxacin in d5w],  Morphine and related, Plavix [clopidogrel bisulfate], Protonix [pantoprazole sodium], and Venofer [ferric oxide]  Family History  Problem Relation Age of Onset  . Heart disease Mother        Heart Disease before age 43  . Hyperlipidemia Mother   . Hypertension Mother   . Diabetes Mother   . Heart attack Mother   . Heart disease Father        Heart Disease before age 81  . Hyperlipidemia Father   . Hypertension Father   . Diabetes Father   . Diabetes Sister   . Hypertension Sister   . Diabetes Brother   . Hyperlipidemia Brother   . Heart attack Brother   . Hypertension Sister   . Heart attack Brother   . Colon cancer Child 33  . Other Other        noncontributory for early CAD  . Esophageal cancer Neg Hx   . Liver disease Neg Hx   . Kidney disease Neg Hx   . Colon polyps Neg Hx     Social History Social History   Tobacco Use  . Smoking status: Never Smoker  . Smokeless tobacco: Never Used  Vaping Use  . Vaping Use: Never used  Substance Use Topics  . Alcohol use: No    Alcohol/week: 0.0 standard drinks  . Drug use: No    Review of Systems  Constitutional: No fever/chills.  Positive generalized weakness and jerking movements.  Eyes: No visual changes. ENT: No sore throat. Cardiovascular: Denies chest pain. Respiratory: Denies shortness of breath. Gastrointestinal: No abdominal pain.  No nausea, no vomiting.  No diarrhea.  No constipation. Genitourinary: Negative for dysuria. Musculoskeletal: Negative for back pain. Skin: Negative for rash. Neurological: Negative for headaches, focal weakness or numbness. Myoclonic jerking in arms/legs.   10-point ROS otherwise negative.  ____________________________________________   PHYSICAL EXAM:  VITAL SIGNS: ED Triage Vitals [11/09/20 1339]  Enc Vitals Group     BP (!) 172/51     Pulse Rate (!) 59     Resp 16     Temp 98.4 F (36.9 C)     Temp Source Oral     SpO2 96 %    Constitutional: Alert and oriented. Well appearing and in no acute distress. Eyes: Conjunctivae are normal.  Head: Atraumatic. Nose: No congestion/rhinnorhea. Mouth/Throat: Mucous membranes are moist.  Neck: No stridor.  Cardiovascular: Normal rate, regular rhythm. Good peripheral circulation. Grossly normal heart sounds.   Respiratory: Normal respiratory effort.  No retractions. Lungs CTAB. Gastrointestinal: Soft and nontender. No distention.  Musculoskeletal: No lower extremity tenderness nor edema. No gross deformities of extremities. Neurologic:  Normal speech and language.  Patient with 4+/5 strength in the bilateral upper and lower extremities without drift but when lifting arms or legs does have some jerking type movements. Normal patellar reflexes and sensation.  Skin:  Skin is warm, dry and intact. No rash noted.   ____________________________________________   LABS (all labs ordered are listed, but only abnormal results are displayed)  Labs Reviewed  COMPREHENSIVE METABOLIC PANEL - Abnormal; Notable for the following components:      Result Value   BUN 43 (*)    Creatinine, Ser 9.90 (*)    Calcium 10.4 (*)    AST 8  (*)    GFR, Estimated 4 (*)    All other components within normal limits  CBC WITH DIFFERENTIAL/PLATELET - Abnormal; Notable for the following components:   RBC 2.46 (*)    Hemoglobin 8.9 (*)  HCT 30.5 (*)    MCV 124.0 (*)    MCH 36.2 (*)    MCHC 29.2 (*)    RDW 19.3 (*)    All other components within normal limits  TSH - Abnormal; Notable for the following components:   TSH 9.938 (*)    All other components within normal limits  RENAL FUNCTION PANEL - Abnormal; Notable for the following components:   BUN 53 (*)    Creatinine, Ser 12.01 (*)    Phosphorus 6.3 (*)    Albumin 3.1 (*)    GFR, Estimated 3 (*)    All other components within normal limits  CBC - Abnormal; Notable for the following components:   RBC 2.19 (*)    Hemoglobin 8.2 (*)    HCT 26.6 (*)    MCV 121.5 (*)    MCH 37.4 (*)    RDW 19.5 (*)    All other components within normal limits  TROPONIN I (HIGH SENSITIVITY) - Abnormal; Notable for the following components:   Troponin I (High Sensitivity) 36 (*)    All other components within normal limits  TROPONIN I (HIGH SENSITIVITY) - Abnormal; Notable for the following components:   Troponin I (High Sensitivity) 96 (*)    All other components within normal limits  MRSA PCR SCREENING  MAGNESIUM  LIPID PANEL  URINALYSIS, ROUTINE W REFLEX MICROSCOPIC  HEMOGLOBIN A1C  T4, FREE  T3, FREE   ____________________________________________  EKG   EKG Interpretation  Date/Time:  Monday Nov 09 2020 16:10:18 EDT Ventricular Rate:  68 PR Interval:  188 QRS Duration: 142 QT Interval:  467 QTC Calculation: 497 R Axis:   75 Text Interpretation: Sinus rhythm Left bundle branch block No significant changesince last tracing Confirmed by Nanda Quinton [phone]) on 11/09/2020 4:29:30 PM       ____________________________________________  RADIOLOGY  CT Head Wo Contrast  Result Date: 11/09/2020 CLINICAL DATA:  Unsteady 8, bilateral leg weakness for 3 weeks, posterior  headaches EXAM: CT HEAD WITHOUT CONTRAST TECHNIQUE: Contiguous axial images were obtained from the base of the skull through the vertex without intravenous contrast. COMPARISON:  08/30/2020 FINDINGS: Brain: Stable confluent hypodensities throughout the periventricular white matter compatible with chronic small vessel ischemic change. Chronic lacunar infarct left basal ganglia. No signs of acute infarct or hemorrhage. Lateral ventricles and remaining midline structures are unremarkable. No acute extra-axial fluid collections. No mass effect. Vascular: Stable diffuse atherosclerosis.  No hyperdense vessel. Skull: Normal. Negative for fracture or focal lesion. Sinuses/Orbits: No acute finding. Other: None. IMPRESSION: 1. Stable chronic small vessel ischemic changes. No acute intracranial process. Electronically Signed   By: Randa Ngo M.D.   On: 11/09/2020 17:18   DG CHEST PORT 1 VIEW  Result Date: 11/09/2020 CLINICAL DATA:  Increased shortness of breath EXAM: PORTABLE CHEST 1 VIEW COMPARISON:  11/09/2020, 10/05/2020 CT 02/26/2018 FINDINGS: Post sternotomy changes. Cardiomegaly with vascular congestion and increased pulmonary edema. Small bilateral pleural effusions which appears slightly increased. Atelectasis versus mild infiltrate left lung base. No pneumothorax. Vascular stent in the left upper arm IMPRESSION: 1. Cardiomegaly with increased small pleural effusions, worsened vascular congestion and development of interstitial pulmonary edema. 2. Atelectasis versus developing infiltrate at the left base. Electronically Signed   By: Donavan Foil M.D.   On: 11/09/2020 22:47   DG Chest Portable 1 View  Result Date: 11/09/2020 CLINICAL DATA:  Cough for several days EXAM: PORTABLE CHEST 1 VIEW COMPARISON:  10/05/2020 FINDINGS: Cardiac shadow is enlarged but stable. Postsurgical changes  are noted. Scarring is noted in the left base and left mid lung stable from the prior exam. Right basilar scarring is noted as  well. Mild vascular congestion is noted but improved from the prior study. No significant edema is seen. Vascular stenting is noted in the left arm. IMPRESSION: Stable scarring bilaterally. Mild vascular congestion likely related to a degree of volume overload. Electronically Signed   By: Inez Catalina M.D.   On: 11/09/2020 16:22    ____________________________________________   PROCEDURES  Procedure(s) performed:   Procedures  None ____________________________________________   INITIAL IMPRESSION / ASSESSMENT AND PLAN / ED COURSE  Pertinent labs & imaging results that were available during my care of the patient were reviewed by me and considered in my medical decision making (see chart for details).   Patient presents to the emergency department with generalized weakness and myoclonic jerking.  Her blood pressure is significantly elevated.  I do not appreciate any focal neurologic deficit but does have some more diffuse weakness.  Likely metabolic with history of end-stage renal disease.  Have added a TSH along with screening lab work.  Will obtain a CT scan of the head as well. No back/neck pain to suspect spine issue.    05:55 PM  Spoke with Dr. Merlene Laughter regarding the patient's presentation.  The action induced myoclonus in this setting is likely metabolic.  He recommends dialysis and consideration of MRI but suspects that this is metabolic. No additional neuro intervention or testing at this time.   Discussed patient's case with [facility] to request admission. Patient and family (if present) updated with plan. Care transferred to [name] service.  I reviewed all nursing notes, vitals, pertinent old records, EKGs, labs, imaging (as available).  ____________________________________________  FINAL CLINICAL IMPRESSION(S) / ED DIAGNOSES  Final diagnoses:  Action induced myoclonus  Word finding difficulty  Generalized weakness     MEDICATIONS GIVEN DURING THIS VISIT:  Medications   aspirin EC tablet 81 mg (0 mg Oral Hold 11/10/20 0837)  amiodarone (PACERONE) tablet 200 mg (0 mg Oral Hold 11/10/20 0836)  isosorbide mononitrate (IMDUR) 24 hr tablet 60 mg (0 mg Oral Hold 11/10/20 0837)  simvastatin (ZOCOR) tablet 20 mg (20 mg Oral Not Given 11/10/20 0059)  pantoprazole (PROTONIX) EC tablet 40 mg (0 mg Oral Hold 11/10/20 0838)  sevelamer carbonate (RENVELA) tablet 1,600 mg (1,600 mg Oral Not Given 11/10/20 4665)  multivitamin (RENA-VIT) tablet 1 tablet (0 tablets Oral Hold 11/10/20 0838)  fluticasone (FLONASE) 50 MCG/ACT nasal spray 1 spray (has no administration in time range)   stroke: mapping our early stages of recovery book (has no administration in time range)  acetaminophen (TYLENOL) tablet 650 mg (has no administration in time range)    Or  acetaminophen (TYLENOL) 160 MG/5ML solution 650 mg (has no administration in time range)    Or  acetaminophen (TYLENOL) suppository 650 mg (has no administration in time range)  senna-docusate (Senokot-S) tablet 1 tablet (has no administration in time range)  heparin injection 5,000 Units (5,000 Units Subcutaneous Given 11/10/20 0653)  diphenhydrAMINE (BENADRYL) injection 25 mg (has no administration in time range)  clonazePAM (KLONOPIN) disintegrating tablet 0.25 mg (has no administration in time range)  morphine 2 MG/ML injection 2 mg (has no administration in time range)  Chlorhexidine Gluconate Cloth 2 % PADS 6 each (has no administration in time range)  pentafluoroprop-tetrafluoroeth (GEBAUERS) aerosol 1 application (has no administration in time range)  lidocaine (PF) (XYLOCAINE) 1 % injection 5 mL (has no administration in  time range)  lidocaine-prilocaine (EMLA) cream 1 application (has no administration in time range)  0.9 %  sodium chloride infusion (has no administration in time range)  0.9 %  sodium chloride infusion (has no administration in time range)  nitroGLYCERIN 50 mg in dextrose 5 % 250 mL (0.2 mg/mL) infusion (0 mcg/min  Intravenous Stopped 11/10/20 0959)  Darbepoetin Alfa (ARANESP) injection 60 mcg (has no administration in time range)  hydrALAZINE (APRESOLINE) injection 20 mg (20 mg Intravenous Given 11/09/20 2231)     Note:  This document was prepared using Dragon voice recognition software and may include unintentional dictation errors.  Nanda Quinton, MD, [facility] Emergency Medicine    Caroljean Monsivais, [facility], MD 11/10/20 1040

## 2020-11-10 ENCOUNTER — Observation Stay (HOSPITAL_COMMUNITY): Payer: Medicare HMO

## 2020-11-10 ENCOUNTER — Inpatient Hospital Stay (HOSPITAL_COMMUNITY): Payer: Self-pay | Admitting: Internal Medicine

## 2020-11-10 DIAGNOSIS — E119 Type 2 diabetes mellitus without complications: Secondary | ICD-10-CM

## 2020-11-10 DIAGNOSIS — Z8673 Personal history of transient ischemic attack (TIA), and cerebral infarction without residual deficits: Secondary | ICD-10-CM | POA: Diagnosis not present

## 2020-11-10 DIAGNOSIS — G253 Myoclonus: Secondary | ICD-10-CM | POA: Diagnosis not present

## 2020-11-10 DIAGNOSIS — E7800 Pure hypercholesterolemia, unspecified: Secondary | ICD-10-CM | POA: Diagnosis present

## 2020-11-10 DIAGNOSIS — E785 Hyperlipidemia, unspecified: Secondary | ICD-10-CM | POA: Diagnosis present

## 2020-11-10 DIAGNOSIS — D649 Anemia, unspecified: Secondary | ICD-10-CM | POA: Diagnosis present

## 2020-11-10 DIAGNOSIS — J9601 Acute respiratory failure with hypoxia: Secondary | ICD-10-CM | POA: Diagnosis not present

## 2020-11-10 DIAGNOSIS — Z9115 Patient's noncompliance with renal dialysis: Secondary | ICD-10-CM | POA: Diagnosis not present

## 2020-11-10 DIAGNOSIS — I132 Hypertensive heart and chronic kidney disease with heart failure and with stage 5 chronic kidney disease, or end stage renal disease: Secondary | ICD-10-CM | POA: Diagnosis not present

## 2020-11-10 DIAGNOSIS — R2681 Unsteadiness on feet: Secondary | ICD-10-CM

## 2020-11-10 DIAGNOSIS — I480 Paroxysmal atrial fibrillation: Secondary | ICD-10-CM | POA: Diagnosis present

## 2020-11-10 DIAGNOSIS — Z885 Allergy status to narcotic agent status: Secondary | ICD-10-CM | POA: Diagnosis not present

## 2020-11-10 DIAGNOSIS — J810 Acute pulmonary edema: Secondary | ICD-10-CM | POA: Diagnosis present

## 2020-11-10 DIAGNOSIS — I472 Ventricular tachycardia: Secondary | ICD-10-CM | POA: Diagnosis not present

## 2020-11-10 DIAGNOSIS — Z91041 Radiographic dye allergy status: Secondary | ICD-10-CM | POA: Diagnosis not present

## 2020-11-10 DIAGNOSIS — R262 Difficulty in walking, not elsewhere classified: Secondary | ICD-10-CM | POA: Diagnosis present

## 2020-11-10 DIAGNOSIS — I2510 Atherosclerotic heart disease of native coronary artery without angina pectoris: Secondary | ICD-10-CM | POA: Diagnosis present

## 2020-11-10 DIAGNOSIS — Z8543 Personal history of malignant neoplasm of ovary: Secondary | ICD-10-CM | POA: Diagnosis not present

## 2020-11-10 DIAGNOSIS — I5032 Chronic diastolic (congestive) heart failure: Secondary | ICD-10-CM | POA: Diagnosis not present

## 2020-11-10 DIAGNOSIS — Z888 Allergy status to other drugs, medicaments and biological substances status: Secondary | ICD-10-CM | POA: Diagnosis not present

## 2020-11-10 DIAGNOSIS — E1122 Type 2 diabetes mellitus with diabetic chronic kidney disease: Secondary | ICD-10-CM | POA: Diagnosis not present

## 2020-11-10 DIAGNOSIS — N186 End stage renal disease: Secondary | ICD-10-CM | POA: Diagnosis not present

## 2020-11-10 DIAGNOSIS — E8889 Other specified metabolic disorders: Secondary | ICD-10-CM | POA: Diagnosis present

## 2020-11-10 DIAGNOSIS — N2581 Secondary hyperparathyroidism of renal origin: Secondary | ICD-10-CM | POA: Diagnosis not present

## 2020-11-10 DIAGNOSIS — Z951 Presence of aortocoronary bypass graft: Secondary | ICD-10-CM | POA: Diagnosis not present

## 2020-11-10 DIAGNOSIS — K219 Gastro-esophageal reflux disease without esophagitis: Secondary | ICD-10-CM | POA: Diagnosis not present

## 2020-11-10 DIAGNOSIS — Z992 Dependence on renal dialysis: Secondary | ICD-10-CM | POA: Diagnosis not present

## 2020-11-10 LAB — RENAL FUNCTION PANEL
Albumin: 3.1 g/dL — ABNORMAL LOW (ref 3.5–5.0)
Anion gap: 13 (ref 5–15)
BUN: 53 mg/dL — ABNORMAL HIGH (ref 8–23)
CO2: 25 mmol/L (ref 22–32)
Calcium: 10.1 mg/dL (ref 8.9–10.3)
Chloride: 101 mmol/L (ref 98–111)
Creatinine, Ser: 12.01 mg/dL — ABNORMAL HIGH (ref 0.44–1.00)
GFR, Estimated: 3 mL/min — ABNORMAL LOW (ref >60)
Glucose, Bld: 86 mg/dL (ref 70–99)
Phosphorus: 6.3 mg/dL — ABNORMAL HIGH (ref 2.5–4.6)
Potassium: 4.4 mmol/L (ref 3.5–5.1)
Sodium: 139 mmol/L (ref 135–145)

## 2020-11-10 LAB — LIPID PANEL
Cholesterol: 123 mg/dL (ref 0–200)
HDL: 58 mg/dL (ref >40)
LDL Cholesterol: 48 mg/dL (ref 0–99)
Total CHOL/HDL Ratio: 2.1 RATIO
Triglycerides: 87 mg/dL (ref <150)
VLDL: 17 mg/dL (ref 0–40)

## 2020-11-10 LAB — CBC
HCT: 26.6 % — ABNORMAL LOW (ref 36.0–46.0)
Hemoglobin: 8.2 g/dL — ABNORMAL LOW (ref 12.0–15.0)
MCH: 37.4 pg — ABNORMAL HIGH (ref 26.0–34.0)
MCHC: 30.8 g/dL (ref 30.0–36.0)
MCV: 121.5 fL — ABNORMAL HIGH (ref 80.0–100.0)
Platelets: 258 10*3/uL (ref 150–400)
RBC: 2.19 MIL/uL — ABNORMAL LOW (ref 3.87–5.11)
RDW: 19.5 % — ABNORMAL HIGH (ref 11.5–15.5)
WBC: 6.3 10*3/uL (ref 4.0–10.5)
nRBC: 0 % (ref 0.0–0.2)

## 2020-11-10 LAB — MRSA PCR SCREENING: MRSA by PCR: NEGATIVE

## 2020-11-10 LAB — T4, FREE: Free T4: 0.8 ng/dL (ref 0.61–1.12)

## 2020-11-10 LAB — HEMOGLOBIN A1C
Hgb A1c MFr Bld: 4.3 % — ABNORMAL LOW (ref 4.8–5.6)
Mean Plasma Glucose: 76.71 mg/dL

## 2020-11-10 LAB — TROPONIN I (HIGH SENSITIVITY): Troponin I (High Sensitivity): 96 ng/L — ABNORMAL HIGH (ref <18)

## 2020-11-10 IMAGING — MR MR HEAD W/O CM
11 of 12 series · 40 of 48 positions shown · non-contrast
Comparison: Prior head CT examinations [DATE] and earlier.
Report from brain MRI [DATE] (images currently unavailable).

CLINICAL DATA: Neuro deficit, acute, stroke suspected. Additional
history provided: Difficulty walking secondary to jerking, history
of TIA.

EXAM:
MRI HEAD WITHOUT CONTRAST
TECHNIQUE: Multiplanar, multiecho pulse sequences of the brain and surrounding
structures were obtained without intravenous contrast.

[Series 5: DWI · axial · 4.0mm · 0.88mm/px · z∈[-77,+57]mm · 4 of 36 slices shown (1 of 6)]
[im 1/36]
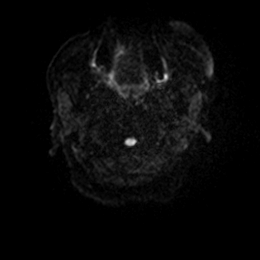
[im 12/36]
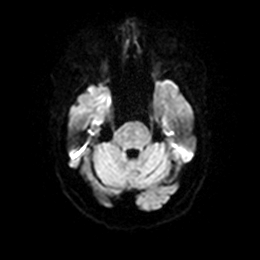
[im 24/36]
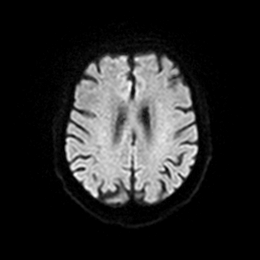
[im 36/36]
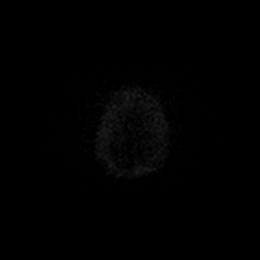

[Series 5: DWI · axial · 4.0mm · 0.88mm/px · z∈[-77,+57]mm · 4 of 36 slices shown (2 of 6)]
[im 1/36]
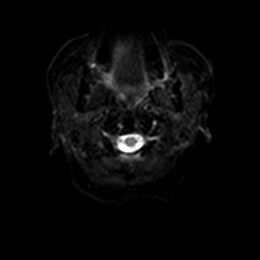
[im 12/36]
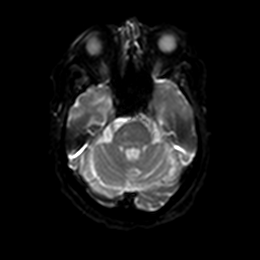
[im 24/36]
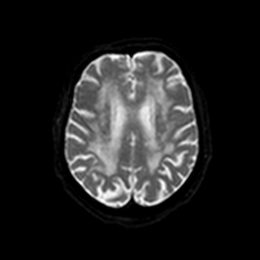
[im 36/36]
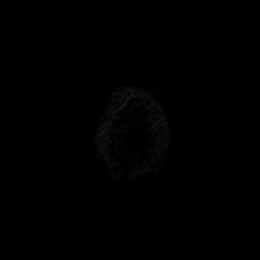

[Series 6: DWI · axial · 4.0mm · 0.88mm/px · z∈[-77,+57]mm · 4 of 36 slices shown (3 of 6)]
[im 1/36]
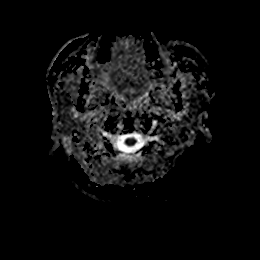
[im 12/36]
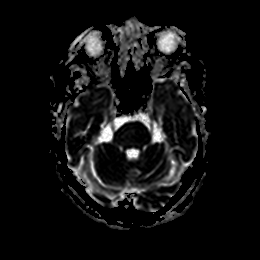
[im 24/36]
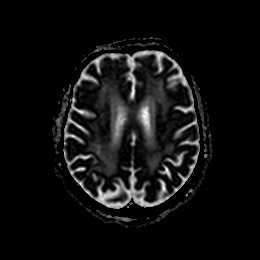
[im 36/36]
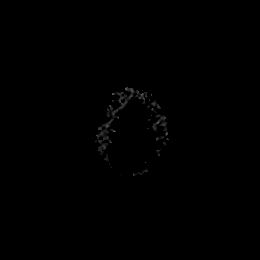

[Series 7: DWI · coronal · 5.0mm · 0.88mm/px · 3 of 28 slices shown (4 of 6)]
[im 1/28]
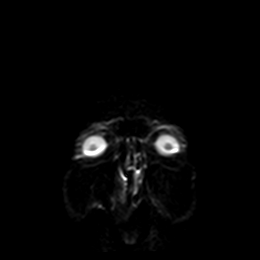
[im 14/28]
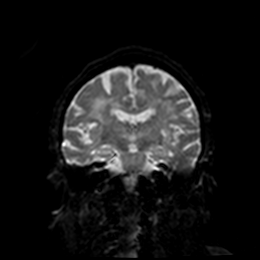
[im 28/28]
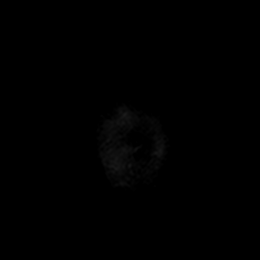

[Series 7: DWI · coronal · 5.0mm · 0.88mm/px · 4 of 28 slices shown (5 of 6)]
[im 1/28]
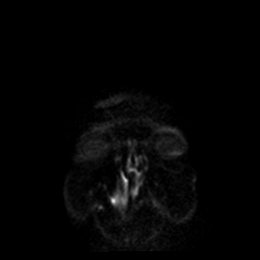
[im 10/28]
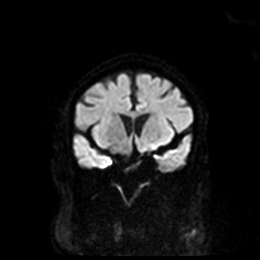
[im 19/28]
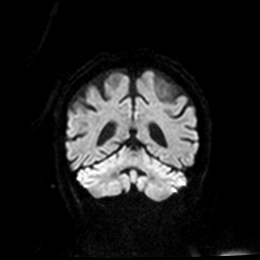
[im 28/28]
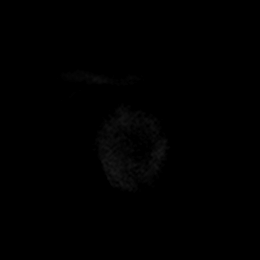

[Series 8: DWI · coronal · 5.0mm · 0.88mm/px · 4 of 28 slices shown (6 of 6)]
[im 1/28]
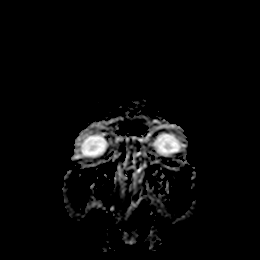
[im 10/28]
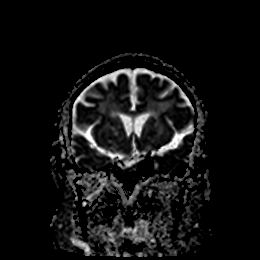
[im 19/28]
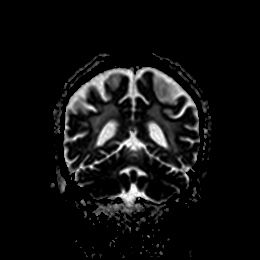
[im 28/28]
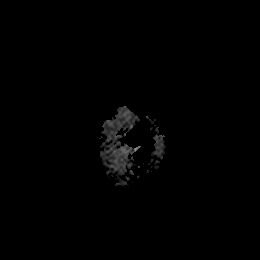

[Series 9: T1 · sagittal · 5.0mm · 0.94mm/px · 3 of 24 slices shown]
[im 1/24]
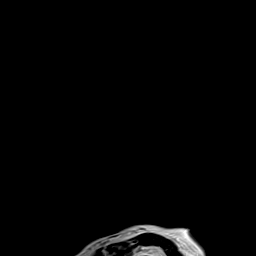
[im 12/24]
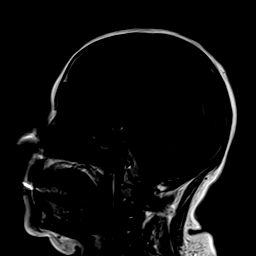
[im 24/24]
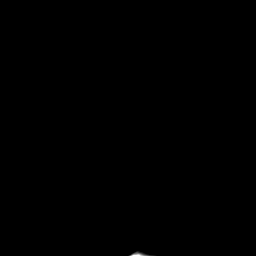

[Series 10: T2 · axial · 5.0mm · 0.72mm/px · z∈[-74,+53]mm · 3 of 20 slices shown (1 of 2)]
[im 1/20]
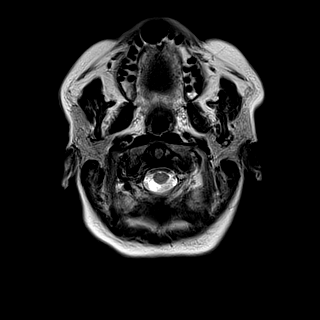
[im 10/20]
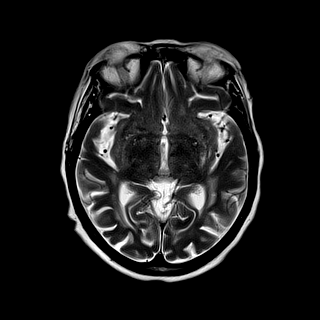
[im 20/20]
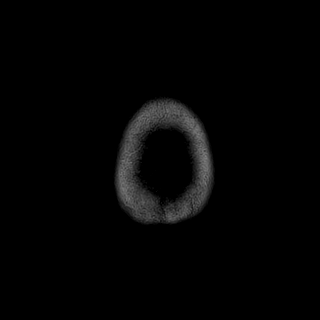

[Series 11: ax hemo · axial · 5.0mm · 0.86mm/px · z∈[-81,+57]mm · 3 of 25 slices shown]
[im 1/25]
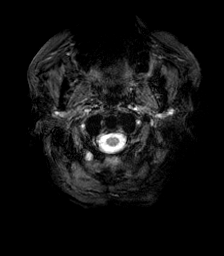
[im 13/25]
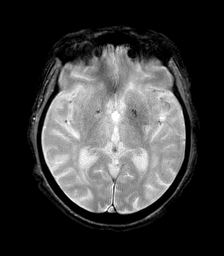
[im 25/25]
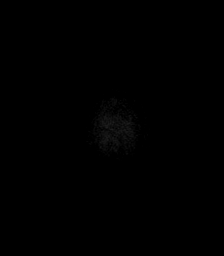

[Series 12: FLAIR · axial · 4.0mm · 0.43mm/px · z∈[-71,+47]mm · 4 of 32 slices shown]
[im 1/32]
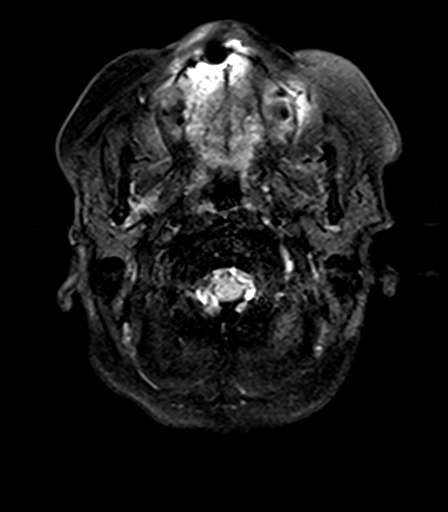
[im 11/32]
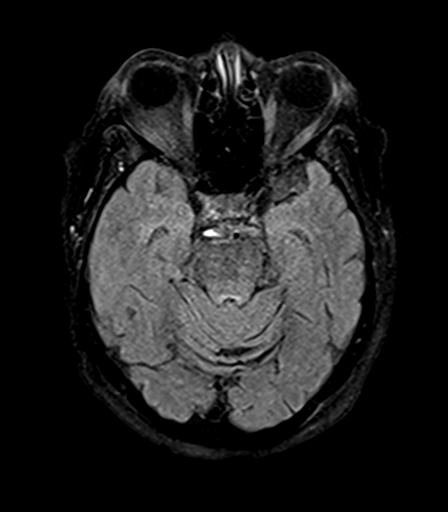
[im 21/32]
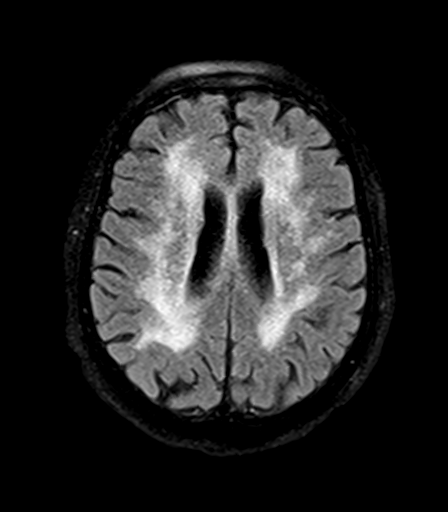
[im 32/32]
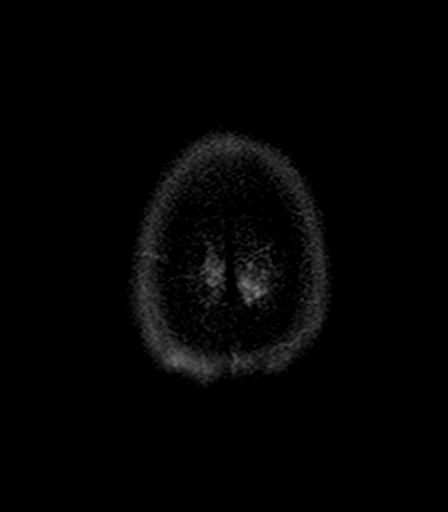

[Series 14: T2 · coronal · 5.0mm · 0.72mm/px · 4 of 28 slices shown (2 of 2)]
[im 1/28]
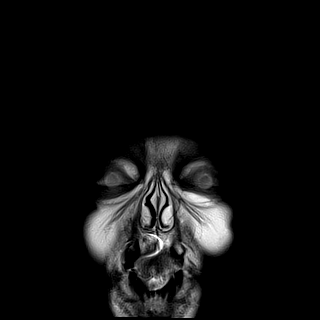
[im 10/28]
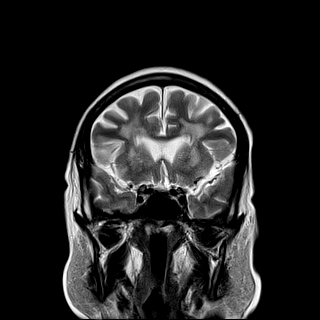
[im 19/28]
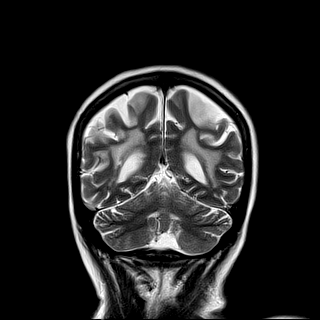
[im 28/28]
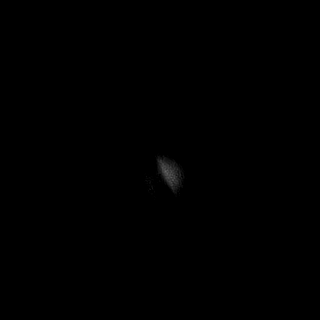

[40 of 48 positions shown; findings below may reference images not displayed]

FINDINGS: Brain:

Mild-to-moderate generalized parenchymal atrophy.

Chronic lacunar infarcts within the bilateral basal ganglia.

Background severe patchy and confluent T2/FLAIR hyperintensity
within the cerebral white matter, nonspecific but compatible with
chronic small vessel ischemic disease. To a lesser degree, chronic
small vessel ischemic changes are also present within the pons.

There are two chronic lacunar infarcts within the left cerebellar
hemisphere.

There are a few scattered nonspecific supratentorial and
infratentorial chronic microhemorrhages.

There is no acute infarct.

No evidence of intracranial mass.

No extra-axial fluid collection.

No midline shift.

Vascular: Expected proximal arterial flow voids.

Skull and upper cervical spine: No focal marrow lesion. Incompletely
assessed cervical spondylosis.

Sinuses/Orbits: Visualized orbits show no acute finding. Trace
bilateral ethmoid sinus mucosal thickening. Small volume secretions
layering within the left sphenoid sinus.
IMPRESSION: No evidence of acute intracranial abnormality.

Chronic lacunar infarcts within the bilateral basal ganglia and left
cerebellar hemisphere. Background severe cerebral white matter
chronic small vessel ischemic disease. Mild chronic small vessel
ischemic changes are also present within the pons.

Mild-to-moderate generalized parenchymal atrophy.

Left sphenoid sinusitis.

## 2020-11-10 MED ORDER — PENTAFLUOROPROP-TETRAFLUOROETH EX AERO: 1.0000 "application " | INHALATION_SPRAY | CUTANEOUS | Status: DC | PRN

## 2020-11-10 MED ORDER — SODIUM CHLORIDE 0.9 % IV SOLN: 100.0000 mL | INTRAVENOUS | Status: DC | PRN

## 2020-11-10 MED ORDER — NITROGLYCERIN IN D5W 200-5 MCG/ML-% IV SOLN
0.0000 ug/min | INTRAVENOUS | Status: DC
  Administered 2020-11-10: 20 ug/min via INTRAVENOUS
  Administered 2020-11-10: 5 ug/min via INTRAVENOUS
  Filled 2020-11-10: qty 250

## 2020-11-10 MED ORDER — LIDOCAINE-PRILOCAINE 2.5-2.5 % EX CREA: 1.0000 "application " | TOPICAL_CREAM | CUTANEOUS | Status: DC | PRN

## 2020-11-10 MED ORDER — DARBEPOETIN ALFA 60 MCG/0.3ML IJ SOSY: 60.0000 ug | PREFILLED_SYRINGE | INTRAMUSCULAR | Status: DC

## 2020-11-10 MED ORDER — LIDOCAINE HCL (PF) 1 % IJ SOLN: 5.0000 mL | INTRAMUSCULAR | Status: DC | PRN

## 2020-11-10 NOTE — Progress Notes (Signed)
At 2200 Dr. Orlin Hilding on unit.  Respiratory therapist here to place patient on BIPAP.

## 2020-11-10 NOTE — Progress Notes (Signed)
Pt transferred to ICU from med surg floor.  Pt was unable to communicate upon admission per pts med surg nurse Candace  so the admission portion of  the chart has not been done yet.  Pt resting comfortable, VSS stable,  Pt very sleepy.

## 2020-11-10 NOTE — Progress Notes (Signed)
OT Cancellation Note  Patient Details Name: Shawna [name] MRN: [id number] DOB: 1940-07-28   Cancelled Treatment:    Reason Eval/Treat Not Completed: Patient at procedure or test/ unavailable. Pt receiving dialysis upon entrance into room. Pt also still on BiPAP. Will attempt evaluation at a later time.   Kairon Shock OT, MOT   Larey Seat 11/10/2020, 10:05 AM

## 2020-11-10 NOTE — Progress Notes (Signed)
Patient to MRI.

## 2020-11-10 NOTE — Progress Notes (Signed)
Patient was admitted earlier this morning.  H&P reviewed.  Patient seen and examined.  S: Patient currently on BiPAP.  She denies any chest pain.  States that shortness of breath is improved.  O: Vital signs reviewed.  Blood pressure noted to be better compared to last night.  Currently saturating in the late 90s on BiPAP.  Respiratory rate is normal.  Heart rate is normal.  She is afebrile.  She is awake and alert.  In no distress She has a crackles bilateral bases.  Mildly tachypneic S1-S2 is normal regular. Minimal pedal edema is noted No focal neurological deficits but she does have some myoclonic jerks at times  A/P:  Patient presented with worsening jerking movements.  She has a known history of myoclonus.  She was found to be hypertensive and was noted to be dyspneic.  Chest x-ray showed pulm edema.  She is ESRD patient and had missed her dialysis on Monday.  Last dialysis was on 4/29.  She mentions that she has been having some difficulty going to dialysis due to her myoclonic jerks.  Nephrology was consulted overnight.  Plan is for dialysis this morning.  Patient seems to be stable from a respiratory standpoint while she is on the BiPAP.  Her blood pressure noted to be better.  Mildly elevated troponin level likely due to pulmonary edema.  Patient denies any chest pain.  EKG shows LBBB.  Similar to previous.  As regards to myoclonus patient noted to be on Klonopin.  She was on gabapentin which can accumulate in patients with renal dysfunction.  This has been discontinued.  Neurology was contacted while patient was in the ED and felt that this was all likely metabolic.  MRI was recommended by neurology which is pending. PT/OT when more stable.  No other focal deficits are noted on examination.  Please review H&P for her other issues.  Continue to monitor.  Bonnielee Haff 11/10/2020

## 2020-11-10 NOTE — Progress Notes (Signed)
Bipap removed - [place] @ 4L/min applied.  Tolerating well , O2 sats 97-100% No distress/SOB  Oral care provided  Will cont to monitor  Resp therapy aware

## 2020-11-10 NOTE — Progress Notes (Signed)
PT Cancellation Note  Patient Details Name: MAZEY MANTELL MRN: [id number] DOB: 14-Nov-1940   Cancelled Treatment:    Reason Eval/Treat Not Completed: Patient at procedure or test/unavailable.  Patient receiving dialysis in AM and out to MRI in PM.  Will check back tomorrow.   3:37 PM, 11/10/20 Lonell Grandchild, MPT Physical Therapist with [facility] [phone] office [phone] mobile phone

## 2020-11-10 NOTE — Consult Note (Signed)
[facility] Renal Consultation Note    Indication for Consultation:  Management of ESRD/hemodialysis; anemia, hypertension/volume and secondary hyperparathyroidism  HPI: Shawna [name] is a 80 y.o. female with PMH significant for HTN, CAD, P. Atrial fibrilation, h/o TIA, recent covid-19 infection, chronic diastolic CHF,  and ESRD on HD MWF at [facility] presented to [facility] ED complaining of 3 day history of involuntary jerking and difficulty walking.  She also complained of intermittent chest pain along with SOB and DOE.  She missed her HD due to the inability to walk.  In the ED she was noted to hypertensive at 196/53, CXR without significant pulmonary edema, however she developed worsening SOB and work of breathing and BP's rose to 204/49.  Repeat CXR revealed interstitial edema and she required BiPap due to acute hypoxic respiratory failure and likely flash pulmonary edema from hypertensive urgency.  We were consulted to provide HD during her hospitalization.  She feels better this morning on BiPap and denies any N/V/D.  Past Medical History:  Diagnosis Date  . Acute on chronic respiratory failure with hypoxia ([place]) 10/10/2016  . Anxiety   . Arthritis   . AVM (arteriovenous malformation) of colon   . CAD (coronary artery disease)    a. s/p CABG in 2013 b. DES to D1 in 10/2016. c. cath in 07/2018 showing patent grafts with occlusion of D1 at prior stent site and progression of PDA disease --> medical management recommended  . Carotid artery disease ([place])    a. 14-48% LICA, 07/8561   . Chronic anemia   . Chronic bronchitis ([place])   . Chronic diastolic CHF (congestive heart failure) ([place])    a. 02/2012 Echo EF 60-65%, nl wall motion, Gr 1 DD, mod MR  . Colon cancer ([place]) 1992  . Esophageal stricture   . ESRD on hemodialysis ([place])    ESRD due to HTN, started dialysis 2011 and gets HD at [facility] with Dr Hinda Lenis on MWF schedule.  Access is LUA AVF as of Sept 2014.   Marland Kitchen GERD  (gastroesophageal reflux disease)   . High cholesterol 12/2011  . History of blood transfusion 07/2011; 12/2011; 01/2012 X 2; 04/2012  . History of gout   . History of lower GI bleeding   . Hypertension   . Iron deficiency anemia   . Mitral regurgitation    a. Moderate by echo, 02/2012  . Myocardial infarction ([place])   . NSVT (nonsustained ventricular tachycardia) ([place])   . Ovarian cancer ([place]) 1992  . PAF (paroxysmal atrial fibrillation) ([place])   . Pneumonia ~ 2009  . PUD (peptic ulcer disease)   . TIA (transient ischemic attack)    Past Surgical History:  Procedure Laterality Date  . A/V SHUNTOGRAM Left 03/19/2019   Procedure: A/V SHUNTOGRAM;  Surgeon: Katha Cabal, MD;  Location: [place] CV LAB;  Service: Cardiovascular;  Laterality: Left;  . ABDOMINAL HYSTERECTOMY  1992  . APPENDECTOMY  06/1990  . AV FISTULA PLACEMENT  07/2009   left upper arm  . AV FISTULA PLACEMENT Right 09/06/2016   Procedure: RIGHT FOREARM ARTERIOVENOUS (AV) GRAFT;  Surgeon: Elam Dutch, MD;  Location: [facility] OR;  Service: Vascular;  Laterality: Right;  . AV FISTULA PLACEMENT N/A 02/24/2017   Procedure: INSERTION OF ARTERIOVENOUS (AV) GORE-TEX GRAFT ARM (BRACHIAL AXILLARY);  Surgeon: Katha Cabal, MD;  Location: [facility] ORS;  Service: Vascular;  Laterality: N/A;  . [place] Right 09/06/2016   Procedure: REMOVAL OF Right Arm ARTERIOVENOUS GORETEX GRAFT  and Vein Patch angioplasty of brachial artery;  Surgeon: Angelia Mould, MD;  Location: [place];  Service: Vascular;  Laterality: Right;  . BIOPSY  09/26/2019   Procedure: BIOPSY;  Surgeon: Rogene Houston, MD;  [facility];  Service: Endoscopy;;  . COLON RESECTION  1992  . COLON SURGERY    . COLONOSCOPY N/A 03/09/2019   Procedure: COLONOSCOPY;  Surgeon: Rogene Houston, MD;  [facility];  Service: Endoscopy;  Laterality: N/A;  . CORONARY ANGIOPLASTY WITH STENT PLACEMENT  12/15/11   "2"  . CORONARY ANGIOPLASTY WITH STENT  PLACEMENT  y/2013   "1; makes total of 3" (05/02/2012)  . CORONARY ARTERY BYPASS GRAFT  06/13/2012   Procedure: CORONARY ARTERY BYPASS GRAFTING (CABG);  Surgeon: Grace Isaac, MD;  Location: [place];  Service: Open Heart Surgery;  Laterality: N/A;  cabg x four;  using left internal mammary artery, and left leg greater saphenous vein harvested endoscopically  . CORONARY STENT INTERVENTION N/A 10/13/2016   Procedure: Coronary Stent Intervention;  Surgeon: Troy Sine, MD;  Location: [place] CV LAB;  Service: Cardiovascular;  Laterality: N/A;  . DIALYSIS/PERMA CATHETER REMOVAL N/A 04/18/2017   Procedure: DIALYSIS/PERMA CATHETER REMOVAL;  Surgeon: Katha Cabal, MD;  Location: [place] CV LAB;  Service: Cardiovascular;  Laterality: N/A;  . DILATION AND CURETTAGE OF UTERUS    . ESOPHAGOGASTRODUODENOSCOPY  01/20/2012   Procedure: ESOPHAGOGASTRODUODENOSCOPY (EGD);  Surgeon: Ladene Artist, MD,FACG;  Location: [facility] ENDOSCOPY;  Service: Endoscopy;  Laterality: N/A;  . ESOPHAGOGASTRODUODENOSCOPY N/A 03/26/2013   Procedure: ESOPHAGOGASTRODUODENOSCOPY (EGD);  Surgeon: Irene Shipper, MD;  Location: [facility] ENDOSCOPY;  Service: Endoscopy;  Laterality: N/A;  . ESOPHAGOGASTRODUODENOSCOPY N/A 04/30/2015   Procedure: ESOPHAGOGASTRODUODENOSCOPY (EGD);  Surgeon: Rogene Houston, MD;  [facility];  Service: Endoscopy;  Laterality: N/A;  1pm - moved to 10/20 @ 1:10  . ESOPHAGOGASTRODUODENOSCOPY N/A 07/29/2016   Procedure: ESOPHAGOGASTRODUODENOSCOPY (EGD);  Surgeon: Manus Gunning, MD;  Location: [place];  Service: Gastroenterology;  Laterality: N/A;  enteroscopy  . ESOPHAGOGASTRODUODENOSCOPY N/A 09/26/2019   Procedure: ESOPHAGOGASTRODUODENOSCOPY (EGD);  Surgeon: Rogene Houston, MD;  [facility];  Service: Endoscopy;  Laterality: N/A;  1250  . GIVENS CAPSULE STUDY N/A 03/07/2019   Procedure: GIVENS CAPSULE STUDY;  Surgeon: Rogene Houston, MD;  [facility];   Service: Endoscopy;  Laterality: N/A;  7:30  . INSERTION OF DIALYSIS CATHETER N/A 10/05/2020   Procedure: ABORTED TUNNELED DIALYSIS CATHETER PLACEMENT RIGHT INTERNAL JUGULAR VEIN ;  Surgeon: Virl Cagey, MD;  Location[facility];  Service: General;  Laterality: N/A;  . INTRAOPERATIVE TRANSESOPHAGEAL ECHOCARDIOGRAM  06/13/2012   Procedure: INTRAOPERATIVE TRANSESOPHAGEAL ECHOCARDIOGRAM;  Surgeon: Grace Isaac, MD;  Location: [place];  Service: Open Heart Surgery;  Laterality: N/A;  . IR DIALY SHUNT INTRO NEEDLE/INTRACATH INITIAL W/IMG LEFT Left 10/06/2020  . IR FLUORO GUIDE CV LINE RIGHT  06/17/2020  . IR GENERIC HISTORICAL  07/26/2016   IR FLUORO GUIDE CV LINE RIGHT 07/26/2016 Greggory Keen, MD [facility]  . IR GENERIC HISTORICAL  07/26/2016   IR US GUIDE VASC ACCESS RIGHT 07/26/2016 Greggory Keen, MD [facility]  . IR GENERIC HISTORICAL  08/02/2016   IR US GUIDE VASC ACCESS RIGHT 08/02/2016 Greggory Keen, MD [facility]  . IR GENERIC HISTORICAL  08/02/2016   IR FLUORO GUIDE CV LINE RIGHT 08/02/2016 Greggory Keen, MD [facility]  . IR RADIOLOGY PERIPHERAL GUIDED IV START  03/28/2017  .  IR REMOVAL TUN CV CATH W/O FL  08/11/2020  . IR THROMBECTOMY AV FISTULA W/THROMBOLYSIS INC/SHUNT/IMG LEFT Left 06/17/2020  . IR US GUIDE VASC ACCESS LEFT  06/17/2020  . IR US GUIDE VASC ACCESS RIGHT  03/28/2017  . IR US GUIDE VASC ACCESS RIGHT  06/17/2020  . LEFT HEART CATH AND CORONARY ANGIOGRAPHY N/A 09/20/2016   Procedure: Left Heart Cath and Coronary Angiography;  Surgeon: [name], MD;  Location: [place] CV LAB;  Service: Cardiovascular;  Laterality: N/A;  . LEFT HEART CATH AND CORS/GRAFTS ANGIOGRAPHY N/A 10/13/2016   Procedure: Left Heart Cath and Cors/Grafts Angiography;  Surgeon: Troy Sine, MD;  Location: [employer] CV LAB;  Service: Cardiovascular;  Laterality: N/A;  . LEFT HEART CATH AND CORS/GRAFTS ANGIOGRAPHY N/A 07/13/2018   Procedure: LEFT HEART CATH AND CORS/GRAFTS ANGIOGRAPHY;  Surgeon:  Martinique, Peter M, MD;  Location: [place] CV LAB;  Service: Cardiovascular;  Laterality: N/A;  . LEFT HEART CATHETERIZATION WITH CORONARY ANGIOGRAM N/A 12/15/2011   Procedure: LEFT HEART CATHETERIZATION WITH CORONARY ANGIOGRAM;  Surgeon: Burnell Blanks, MD;  Location: [facility] CATH LAB;  Service: Cardiovascular;  Laterality: N/A;  . LEFT HEART CATHETERIZATION WITH CORONARY ANGIOGRAM N/A 01/10/2012   Procedure: LEFT HEART CATHETERIZATION WITH CORONARY ANGIOGRAM;  Surgeon: Peter M Martinique, MD;  Location: [facility] CATH LAB;  Service: Cardiovascular;  Laterality: N/A;  . LEFT HEART CATHETERIZATION WITH CORONARY ANGIOGRAM N/A 06/08/2012   Procedure: LEFT HEART CATHETERIZATION WITH CORONARY ANGIOGRAM;  Surgeon: Burnell Blanks, MD;  Location: [facility] CATH LAB;  Service: Cardiovascular;  Laterality: N/A;  . LEFT HEART CATHETERIZATION WITH CORONARY/GRAFT ANGIOGRAM N/A 12/10/2013   Procedure: LEFT HEART CATHETERIZATION WITH Beatrix Fetters;  Surgeon: [name], MD;  Location: [facility] CATH LAB;  Service: Cardiovascular;  Laterality: N/A;  . OVARY SURGERY     ovarian cancer  . POLYPECTOMY  03/09/2019   Procedure: POLYPECTOMY;  Surgeon: Rogene Houston, MD;  [facility];  Service: Endoscopy;;  cecal   . POLYPECTOMY N/A 09/26/2019   Procedure: DUODENAL POLYPECTOMY;  Surgeon: Rogene Houston, MD;  [facility];  Service: Endoscopy;  Laterality: N/A;  . REVISION OF ARTERIOVENOUS GORETEX GRAFT N/A 02/24/2017   Procedure: REVISION OF ARTERIOVENOUS GORETEX GRAFT (RESECTION);  Surgeon: Katha Cabal, MD;  Location: [facility] ORS;  Service: Vascular;  Laterality: N/A;  . REVISON OF ARTERIOVENOUS FISTULA Left 06/19/2020   Procedure: REVISION OF LEFT UPPER ARM AV GRAFT WITH INTERPOSITION JUMP GRAFT USING 6MM GORE LIMB;  Surgeon: Marty Heck, MD;  Location: [place];  Service: Vascular;  Laterality: Left;  . SHUNTOGRAM N/A 10/15/2013   Procedure: Fistulogram;  Surgeon: Serafina Mitchell,  MD;  Location: [facility] CATH LAB;  Service: Cardiovascular;  Laterality: N/A;  . THROMBECTOMY / ARTERIOVENOUS GRAFT REVISION  2011   left upper arm  . TUBAL LIGATION  1980's  . UPPER EXTREMITY ANGIOGRAPHY Bilateral 12/06/2016   Procedure: Upper Extremity Angiography;  Surgeon: Katha Cabal, MD;  Location: [place] CV LAB;  Service: Cardiovascular;  Laterality: Bilateral;  . UPPER EXTREMITY INTERVENTION Left 06/06/2017   Procedure: UPPER EXTREMITY INTERVENTION;  Surgeon: Katha Cabal, MD;  Location: [place] CV LAB;  Service: Cardiovascular;  Laterality: Left;   Family History:   Family History  Problem Relation Age of Onset  . Heart disease Mother        Heart Disease before age 70  . Hyperlipidemia Mother   . Hypertension Mother   . Diabetes Mother   .  Heart attack Mother   . Heart disease Father        Heart Disease before age 62  . Hyperlipidemia Father   . Hypertension Father   . Diabetes Father   . Diabetes Sister   . Hypertension Sister   . Diabetes Brother   . Hyperlipidemia Brother   . Heart attack Brother   . Hypertension Sister   . Heart attack Brother   . Colon cancer Child 46  . Other Other        noncontributory for early CAD  . Esophageal cancer Neg Hx   . Liver disease Neg Hx   . Kidney disease Neg Hx   . Colon polyps Neg Hx    Social History:  reports that she has never smoked. She has never used smokeless tobacco. She reports that she does not drink alcohol and does not use drugs. Allergies  Allergen Reactions  . Aspirin Other (See Comments)    High Doses Mess up her stomach; "makes my bowels have blood in them". Takes 81 mg EC Aspirin   . Penicillins Other (See Comments)    SYNCOPE? , "makes me real weak when I take it; like I'll pass out"  Has patient had a PCN reaction causing immediate rash, facial/tongue/throat swelling, SOB or lightheadedness with hypotension: Yes Has patient had a PCN reaction causing severe rash involving mucus  membranes or skin necrosis: no Has patient had a PCN reaction that required hospitalization no Has patient had a PCN reaction occurring within the last 10 years: no If all of the above  . Amlodipine Swelling  . Bactrim [Sulfamethoxazole-Trimethoprim] Rash  . Contrast Media [Iodinated Diagnostic Agents] Itching  . Iron Itching and Other (See Comments)    "they gave me iron in dialysis; had to give me Benadryl cause I had to have the iron" (05/02/2012)  . Nitrofurantoin Hives  . Tylenol [Acetaminophen] Itching and Other (See Comments)    Makes her feet on fire per pt  . Gabapentin Other (See Comments)    Unknown reaction  . Hydralazine Itching  . Levofloxacin   . No Known Allergies   . Ranexa [Ranolazine] Other (See Comments)    Myoclonus-hospitalized   . Dexilant [Dexlansoprazole] Other (See Comments)    Upset stomach  . Levaquin [Levofloxacin In D5w] Rash  . Morphine And Related Itching and Other (See Comments)    Itching in feet  . Plavix [Clopidogrel Bisulfate] Rash  . Protonix [Pantoprazole Sodium] Rash  . Venofer [Ferric Oxide] Itching and Other (See Comments)    Patient reports using Benadryl prior to doses as [place]   Prior to Admission medications   Medication Sig Start Date End Date Taking? Authorizing Provider  amiodarone (PACERONE) 200 MG tablet Take 1 tablet (200 mg total) by mouth daily. Starting July 2 12/17/19   Ahmed Prima Fransisco Hertz, PA-C  aspirin EC 81 MG tablet Take 81 mg by mouth daily.  09/27/19   Rehman, Mechele Dawley, MD  fluticasone (FLONASE) 50 MCG/ACT nasal spray Place 1 spray into both nostrils at bedtime as needed for allergies.     [provider]  gabapentin (NEURONTIN) 100 MG capsule Take 100 mg by mouth daily. 11/04/20   [provider]  isosorbide mononitrate (IMDUR) 60 MG 24 hr tablet Take 1 tablet (60 mg total) by mouth in the morning and at bedtime. 10/20/20 01/18/21  Erma Heritage, PA-C  lidocaine-prilocaine (EMLA) cream Apply  1 application topically every Monday, Wednesday, and Friday. Prior to  dialysis    [provider]  loratadine (CLARITIN) 10 MG tablet Take 1 tablet (10 mg total) by mouth daily as needed for allergies. 07/14/18   Hosie Poisson, MD  multivitamin (RENA-VIT) TABS tablet Take 1 tablet by mouth daily.    [provider]  nitroGLYCERIN (NITROSTAT) 0.4 MG SL tablet Place 1 tablet (0.4 mg total) under the tongue every 5 (five) minutes x 3 doses as needed for chest pain (if no relief after 2nd dose, proceed to the ED for an evaluation or call 911). 09/08/20   Verta Ellen., NP  omeprazole (PRILOSEC) 40 MG capsule Take 1 capsule (40 mg total) by mouth daily before breakfast. 03/31/20   Laurine Blazer B, PA-C  sevelamer carbonate (RENVELA) 800 MG tablet Take 2 tablets (1,600 mg total) by mouth 3 (three) times daily with meals. 06/20/20   Meccariello, Bernita Raisin, DO  simvastatin (ZOCOR) 20 MG tablet Take 1 tablet (20 mg total) by mouth at bedtime. 07/07/20   Verta Ellen., NP   Current Facility-Administered Medications  Medication Dose Route Frequency Provider Last Rate Last Admin  .  stroke: mapping our early stages of recovery book   Does not apply Once Zierle-Ghosh, Asia B, DO      . 0.9 %  sodium chloride infusion  100 mL Intravenous PRN Donato Heinz, MD      . 0.9 %  sodium chloride infusion  100 mL Intravenous PRN Donato Heinz, MD      . acetaminophen (TYLENOL) tablet 650 mg  650 mg Oral Q4H PRN Zierle-Ghosh, Asia B, DO       Or  . acetaminophen (TYLENOL) 160 MG/5ML solution 650 mg  650 mg Per Tube Q4H PRN Zierle-Ghosh, Asia B, DO       Or  . acetaminophen (TYLENOL) suppository 650 mg  650 mg Rectal Q4H PRN Zierle-Ghosh, Asia B, DO      . amiodarone (PACERONE) tablet 200 mg  200 mg Oral Daily Zierle-Ghosh, Asia B, DO      . aspirin EC tablet 81 mg  81 mg Oral Daily Zierle-Ghosh, Asia B, DO      . Chlorhexidine Gluconate Cloth 2 % PADS 6 each  6 each Topical Q0600  Donato Heinz, MD      . clonazePAM (KLONOPIN) disintegrating tablet 0.25 mg  0.25 mg Oral BID PRN Zierle-Ghosh, Asia B, DO      . diphenhydrAMINE (BENADRYL) injection 25 mg  25 mg Intravenous Q6H PRN Zierle-Ghosh, Asia B, DO      . fluticasone (FLONASE) 50 MCG/ACT nasal spray 1 spray  1 spray Each Nare QHS PRN Zierle-Ghosh, Asia B, DO      . gabapentin (NEURONTIN) capsule 100 mg  100 mg Oral Daily Zierle-Ghosh, Asia B, DO      . heparin injection 5,000 Units  5,000 Units Subcutaneous Q8H Zierle-Ghosh, Asia B, DO   5,000 Units at 11/10/20 9983  . isosorbide mononitrate (IMDUR) 24 hr tablet 60 mg  60 mg Oral Daily Zierle-Ghosh, Asia B, DO      . lidocaine (PF) (XYLOCAINE) 1 % injection 5 mL  5 mL Intradermal PRN Donato Heinz, MD      . lidocaine-prilocaine (EMLA) cream 1 application  1 application Topical PRN Donato Heinz, MD      . morphine 2 MG/ML injection 2 mg  2 mg Intravenous Q3H PRN Zierle-Ghosh, Asia B, DO      . multivitamin (RENA-VIT) tablet 1 tablet  1 tablet Oral Daily  Zierle-Ghosh, Asia B, DO      . nitroGLYCERIN 50 mg in dextrose 5 % 250 mL (0.2 mg/mL) infusion  0-200 mcg/min Intravenous Titrated Zierle-Ghosh, Asia B, DO 6 mL/hr at 11/10/20 0930 20 mcg/min at 11/10/20 0930  . pantoprazole (PROTONIX) EC tablet 40 mg  40 mg Oral Daily Zierle-Ghosh, Asia B, DO      . pentafluoroprop-tetrafluoroeth (GEBAUERS) aerosol 1 application  1 application Topical PRN Pamela Maddy, Broadus John, MD      . senna-docusate (Senokot-S) tablet 1 tablet  1 tablet Oral QHS PRN Zierle-Ghosh, Asia B, DO      . sevelamer carbonate (RENVELA) tablet 1,600 mg  1,600 mg Oral TID WC Zierle-Ghosh, Asia B, DO      . simvastatin (ZOCOR) tablet 20 mg  20 mg Oral QHS Zierle-Ghosh, Asia B, DO       Labs: Basic Metabolic Panel: Recent Labs  Lab 11/09/20 1513  NA 139  K 4.7  CL 99  CO2 27  GLUCOSE 88  BUN 43*  CREATININE 9.90*  CALCIUM 10.4*   Liver Function Tests: Recent Labs  Lab 11/09/20 1513   AST 8*  ALT 13  ALKPHOS 80  BILITOT 0.6  PROT 7.0  ALBUMIN 3.6   No results for input(s): LIPASE, AMYLASE in the last 168 hours. No results for input(s): AMMONIA in the last 168 hours. CBC: Recent Labs  Lab 11/09/20 1513  WBC 6.6  NEUTROABS 4.6  HGB 8.9*  HCT 30.5*  MCV 124.0*  PLT 261   Cardiac Enzymes: No results for input(s): CKTOTAL, CKMB, CKMBINDEX, TROPONINI in the last 168 hours. CBG: No results for input(s): GLUCAP in the last 168 hours. Iron Studies: No results for input(s): IRON, TIBC, TRANSFERRIN, FERRITIN in the last 72 hours. Studies/Results: CT Head Wo Contrast  Result Date: 11/09/2020 CLINICAL DATA:  Unsteady 8, bilateral leg weakness for 3 weeks, posterior headaches EXAM: CT HEAD WITHOUT CONTRAST TECHNIQUE: Contiguous axial images were obtained from the base of the skull through the vertex without intravenous contrast. COMPARISON:  08/30/2020 FINDINGS: Brain: Stable confluent hypodensities throughout the periventricular white matter compatible with chronic small vessel ischemic change. Chronic lacunar infarct left basal ganglia. No signs of acute infarct or hemorrhage. Lateral ventricles and remaining midline structures are unremarkable. No acute extra-axial fluid collections. No mass effect. Vascular: Stable diffuse atherosclerosis.  No hyperdense vessel. Skull: Normal. Negative for fracture or focal lesion. Sinuses/Orbits: No acute finding. Other: None. IMPRESSION: 1. Stable chronic small vessel ischemic changes. No acute intracranial process. Electronically Signed   By: Randa Ngo M.D.   On: 11/09/2020 17:18   DG CHEST PORT 1 VIEW  Result Date: 11/09/2020 CLINICAL DATA:  Increased shortness of breath EXAM: PORTABLE CHEST 1 VIEW COMPARISON:  11/09/2020, 10/05/2020 CT 02/26/2018 FINDINGS: Post sternotomy changes. Cardiomegaly with vascular congestion and increased pulmonary edema. Small bilateral pleural effusions which appears slightly increased. Atelectasis  versus mild infiltrate left lung base. No pneumothorax. Vascular stent in the left upper arm IMPRESSION: 1. Cardiomegaly with increased small pleural effusions, worsened vascular congestion and development of interstitial pulmonary edema. 2. Atelectasis versus developing infiltrate at the left base. Electronically Signed   By: Donavan Foil M.D.   On: 11/09/2020 22:47   DG Chest Portable 1 View  Result Date: 11/09/2020 CLINICAL DATA:  Cough for several days EXAM: PORTABLE CHEST 1 VIEW COMPARISON:  10/05/2020 FINDINGS: Cardiac shadow is enlarged but stable. Postsurgical changes are noted. Scarring is noted in the left base and left mid lung stable from the  prior exam. Right basilar scarring is noted as well. Mild vascular congestion is noted but improved from the prior study. No significant edema is seen. Vascular stenting is noted in the left arm. IMPRESSION: Stable scarring bilaterally. Mild vascular congestion likely related to a degree of volume overload. Electronically Signed   By: Inez Catalina M.D.   On: 11/09/2020 16:22    ROS: Pertinent items are noted in HPI. Physical Exam: Vitals:   11/10/20 0748 11/10/20 0900 11/10/20 0915 11/10/20 0930  BP:  (!) 163/49 (!) 169/44 (!) 131/33  Pulse: 62 63 64 68  Resp: 15 13 15 17   Temp: 97.6 F (36.4 C) 97.9 F (36.6 C)    TempSrc: Axillary Axillary    SpO2: 100% 100% 100%   Weight:  63.2 kg    Height:          Weight change:   Intake/Output Summary (Last 24 hours) at 11/10/2020 0941 Last data filed at 11/10/2020 3570 Gross per 24 hour  Intake 0 ml  Output --  Net 0 ml   BP (!) 131/33   Pulse 68   Temp 97.9 F (36.6 C) (Axillary)   Resp 17   Ht 5' (1.524 m)   Wt 63.2 kg   SpO2 100%   BMI 27.21 kg/m  General appearance: alert, cooperative and wearing BiPap Head: Normocephalic, without obvious abnormality, atraumatic Resp: clear to auscultation bilaterally Cardio: bradycardic, no rub GI: soft, non-tender; bowel sounds normal; no  masses,  no organomegaly Extremities: edema trace to 1 + pretibial edema bilaterally and LUE AVF +T/B Dialysis Access:  Dialysis Orders: [facility]  on MWF . EDW 61kg HD Bath 2K/2.5Ca  Time 3.5 hrs Heparin none. Access LUE AVF BFR 300 DFR 600    Hectoral 2.5 mcg IV/HD Epogen 8000   Units IV/HD     Assessment/Plan: 1.  Hypertensive urgency with flash pulmonary edema - improved with bipap and bp meds.  currently on HD and will UF as tolerated. 2. Myoclonus - has been a recurring issue for her.  Recommend stopping gabapentin as this can accumulate in patients with ESRD. 3.  ESRD -  Currently on HD and tolerating it well 4.  Hypertension/volume  -  As above UF as tolerated. 5.  Anemia  - will dose with aranesp and follow 6.  Metabolic bone disease -  meds on hold due to bipap 7.  Nutrition - npo for now 8. Acute hypoxic respiratory arrest - due to #1 and improving with bipap and UF. 9. DM type 2 - per primary 10. Vascular access - have had issues with avf in past but functioning well today.  Donetta Potts, MD [place] Pager [phone] 11/10/2020, 9:41 AM

## 2020-11-10 NOTE — Procedures (Signed)
   HEMODIALYSIS TREATMENT NOTE:  3.5 hour heparin-free HD completed via LUE AVF (15g/antegrade).  Goal NOT met:  UF was limited by hypotension.  NTG infusion was stopped early into treatment but pressures remained soft.  UF rate was repeatedly decreased and/or interrupted for SBP<100.  Net UF 536 cc.  All blood was returned and hemostasis was achieved in 15 minutes.  Rockwell Alexandria, RN

## 2020-11-10 NOTE — Procedures (Signed)
I was present at this dialysis session. I have reviewed the session itself and made appropriate changes.   Vital signs in last 24 hours:  Temp:  [97.6 F (36.4 C)-98.7 F (37.1 C)] 97.9 F (36.6 C) (05/03 0900) Pulse Rate:  [59-88] 68 (05/03 0930) Resp:  [12-28] 17 (05/03 0930) BP: (131-260)/(33-110) 131/33 (05/03 0930) SpO2:  [95 %-100 %] 100 % (05/03 0915) Weight:  [62.5 kg-68 kg] 63.2 kg (05/03 0900) Weight change:  Filed Weights   11/10/20 0133 11/10/20 0231 11/10/20 0900  Weight: 63 kg 68 kg 63.2 kg    Recent Labs  Lab 11/09/20 1513  NA 139  K 4.7  CL 99  CO2 27  GLUCOSE 88  BUN 43*  CREATININE 9.90*  CALCIUM 10.4*    Recent Labs  Lab 11/09/20 1513  WBC 6.6  NEUTROABS 4.6  HGB 8.9*  HCT 30.5*  MCV 124.0*  PLT 261    Scheduled Meds: .  stroke: mapping our early stages of recovery book   Does not apply Once  . amiodarone  200 mg Oral Daily  . aspirin EC  81 mg Oral Daily  . Chlorhexidine Gluconate Cloth  6 each Topical Q0600  . gabapentin  100 mg Oral Daily  . heparin  5,000 Units Subcutaneous Q8H  . isosorbide mononitrate  60 mg Oral Daily  . multivitamin  1 tablet Oral Daily  . pantoprazole  40 mg Oral Daily  . sevelamer carbonate  1,600 mg Oral TID WC  . simvastatin  20 mg Oral QHS   Continuous Infusions: . sodium chloride    . sodium chloride    . nitroGLYCERIN 20 mcg/min (11/10/20 0930)   PRN Meds:.sodium chloride, sodium chloride, acetaminophen **OR** acetaminophen (TYLENOL) oral liquid 160 mg/5 mL **OR** acetaminophen, clonazePAM, diphenhydrAMINE, fluticasone, lidocaine (PF), lidocaine-prilocaine, morphine injection, pentafluoroprop-tetrafluoroeth, senna-docusate   Donetta Potts,  MD 11/10/2020, 9:51 AM

## 2020-11-10 NOTE — Progress Notes (Signed)
SLP Cancellation Note  Patient Details Name: MOSSIE GILDER MRN: [id number] DOB: 15-Jun-1941   Cancelled treatment:       Reason Eval/Treat Not Completed: Other (comment) (Consult received for SLE. Pt is on Bi-PAP. SLP will check back as schedule permits)  Thank you,  Genene Churn, [place]   Anishka Bushard 11/10/2020, 1:58 PM

## 2020-11-10 NOTE — H&P (Signed)
TRH H&P    Patient Demographics:    Shawna [name], is a 80 y.o. female  MRN: [id number]  DOB - [birth year]  Admit Date - 11/09/2020  Referring MD/NP/PA: Long  Outpatient Primary MD for the patient is [facility]  Patient coming from: Home  Chief complaint- jerking   HPI:    Shawna [name]  is a 80 y.o. female, with history of TIA, peptic ulcer disease, paroxysmal atrial fibrillation, mitral regurg, hypertension, hypercholesterol, GERD, ESRD on hemodialysis, chronic diastolic CHF, and more presents the ED with a chief complaint of difficulty walking secondary to jerking.  Patient reports that she has been having muscle spasms for the last 2 months.  Her doctor started her on a medication that she cannot recall the name of, and it has not helped.  And then in the last 24 hours she started having her knees buckle when she is walking.  She reports that it feels like she is having spasms in her knees and legs as well as her arms and hands bilaterally.  It is better but not resolved at rest, and it was worse with active motion.  She reports weakness in her legs secondary to this jerking.  The weakness is also bilateral started over the last 3 days, but worsening in the last 24 hours.  Patient admits to pain in her lower back that is chronic and is worse with standing for long periods of time.  She reports that she has pain medication to take at home for that.  She also reports intermittent chest pain that is been going on so long as she can remember, for which she takes nitro glycerin every other day.  Patient reports a dry cough that is worse at night.  She reports dyspnea was worse on exertion and has been present chronically.  Patient is an ESRD patient with no urine output at all.  She missed dialysis today, and her last dialysis was 4/29.  Patient denies any nausea vomiting diarrhea or skin rashes.  She does report  chronic pruritus.  She has a PCP appointment coming up for this problem.  Patient has no other complaints at the time of admission.  Shortly after admission patient had increased work of breathing and decrease in oxygen sats requiring oxygen supplementation.  Her initial chest x-ray showed mild vascular congestion related to fluid overload.  Repeat chest x-ray showed development of interstitial edema.  Patient was transferred to stepdown, started on BiPAP.  I personally contacted her nephrologist on-call who reported that they will plan to dialyze first thing in the morning.  Hypertensive crisis is also contributing to his pulmonary edema with blood pressure as well over 200.  20 mg of hydralazine started.  Blood pressure improved with hydralazine and if it goes back up we will plan for nitro drip.  Patient does not smoke, does not drink.  She is vaccinated for COVID.  Patient is full code.  In the ED Temperature 98.1, heart rate 59-67, respiratory rate 16-21, blood pressure 196/53, satting at 97% on room  air at admission Chest x-ray shows mild vascular congestion likely related to fluid overload No leukocytosis with a white blood cell count of 6.6, hemoglobin 8.9 Chemistry panel reveals an elevated BUN and creatinine at 43 and 9.90 -expected with missed dialysis Magnesium 2.2 TSH slightly elevated 9.938 EKG shows a heart rate of 68, sinus rhythm, QTC 497 CT head shows stable chronic small vessel ischemia no acute intracranial process Neurology was consulted from the ED and reports that this is probably metabolic but would like to get an MRI in the a.m. Admission requested for further work-up of difficulty with ambulation secondary to myoclonic activity    Review of systems:    In addition to the HPI above,  No Fever-chills, No Headache, No changes with Vision or hearing, No problems swallowing food or Liquids, Admits to chronic intermittent chest pain and dyspnea No Abdominal pain, No  Nausea or Vomiting, bowel movements are regular, No Blood in stool, no urine output No new skin rashes or bruises, No new joints pains-aches,  No new weakness, tingling, numbness in any extremity, No recent weight gain or loss, No polyuria, polydypsia or polyphagia, No significant Mental Stressors.  All other systems reviewed and are negative.    Past History of the following :    Past Medical History:  Diagnosis Date  . Acute on chronic respiratory failure with hypoxia ([place]) 10/10/2016  . Anxiety   . Arthritis   . AVM (arteriovenous malformation) of colon   . CAD (coronary artery disease)    a. s/p CABG in 2013 b. DES to D1 in 10/2016. c. cath in 07/2018 showing patent grafts with occlusion of D1 at prior stent site and progression of PDA disease --> medical management recommended  . Carotid artery disease ([place])    a. 25-95% LICA, 12/3873   . Chronic anemia   . Chronic bronchitis ([place])   . Chronic diastolic CHF (congestive heart failure) ([place])    a. 02/2012 Echo EF 60-65%, nl wall motion, Gr 1 DD, mod MR  . Colon cancer ([place]) 1992  . Esophageal stricture   . ESRD on hemodialysis ([place])    ESRD due to HTN, started dialysis 2011 and gets HD at [facility] with Dr Hinda Lenis on MWF schedule.  Access is LUA AVF as of Sept 2014.   Marland Kitchen GERD (gastroesophageal reflux disease)   . High cholesterol 12/2011  . History of blood transfusion 07/2011; 12/2011; 01/2012 X 2; 04/2012  . History of gout   . History of lower GI bleeding   . Hypertension   . Iron deficiency anemia   . Mitral regurgitation    a. Moderate by echo, 02/2012  . Myocardial infarction ([place])   . NSVT (nonsustained ventricular tachycardia) ([place])   . Ovarian cancer ([place]) 1992  . PAF (paroxysmal atrial fibrillation) ([place])   . Pneumonia ~ 2009  . PUD (peptic ulcer disease)   . TIA (transient ischemic attack)       Past Surgical History:  Procedure Laterality Date  . A/V SHUNTOGRAM Left 03/19/2019   Procedure: A/V SHUNTOGRAM;   Surgeon: Katha Cabal, MD;  Location: [place] [facility];  Service: Cardiovascular;  Laterality: Left;  . ABDOMINAL HYSTERECTOMY  1992  . APPENDECTOMY  06/1990  . AV FISTULA PLACEMENT  07/2009   left upper arm  . AV FISTULA PLACEMENT Right 09/06/2016   Procedure: RIGHT FOREARM ARTERIOVENOUS (AV) GRAFT;  Surgeon: Elam Dutch, MD;  Location: [place];  Service: Vascular;  Laterality: Right;  .  AV FISTULA PLACEMENT N/A 02/24/2017   Procedure: INSERTION OF ARTERIOVENOUS (AV) GORE-TEX GRAFT ARM (BRACHIAL AXILLARY);  Surgeon: Katha Cabal, MD;  Location: [facility];  Service: Vascular;  Laterality: N/A;  . [place] Right 09/06/2016   Procedure: REMOVAL OF Right Arm ARTERIOVENOUS GORETEX GRAFT and Vein Patch angioplasty of brachial artery;  Surgeon: Angelia Mould, MD;  Location: [place];  Service: Vascular;  Laterality: Right;  . BIOPSY  09/26/2019   Procedure: BIOPSY;  Surgeon: Rogene Houston, MD;  [facility];  Service: Endoscopy;;  . COLON RESECTION  1992  . COLON SURGERY    . COLONOSCOPY N/A 03/09/2019   Procedure: COLONOSCOPY;  Surgeon: Rogene Houston, MD;  [facility];  Service: Endoscopy;  Laterality: N/A;  . CORONARY ANGIOPLASTY WITH STENT PLACEMENT  12/15/11   "2"  . CORONARY ANGIOPLASTY WITH STENT PLACEMENT  y/2013   "1; makes total of 3" (05/02/2012)  . CORONARY ARTERY BYPASS GRAFT  06/13/2012   Procedure: CORONARY ARTERY BYPASS GRAFTING (CABG);  Surgeon: Grace Isaac, MD;  Location: [place];  Service: Open Heart Surgery;  Laterality: N/A;  cabg x four;  using left internal mammary artery, and left leg greater saphenous vein harvested endoscopically  . CORONARY STENT INTERVENTION N/A 10/13/2016   Procedure: Coronary Stent Intervention;  Surgeon: Troy Sine, MD;  Location: [place] [facility];  Service: Cardiovascular;  Laterality: N/A;  . DIALYSIS/PERMA CATHETER REMOVAL N/A 04/18/2017   Procedure: DIALYSIS/PERMA CATHETER REMOVAL;  Surgeon:  Katha Cabal, MD;  Location: [place] [facility];  Service: Cardiovascular;  Laterality: N/A;  . DILATION AND CURETTAGE OF UTERUS    . ESOPHAGOGASTRODUODENOSCOPY  01/20/2012   Procedure: ESOPHAGOGASTRODUODENOSCOPY (EGD);  Surgeon: Ladene Artist, MD,FACG;  Location: [facility] ENDOSCOPY;  Service: Endoscopy;  Laterality: N/A;  . ESOPHAGOGASTRODUODENOSCOPY N/A 03/26/2013   Procedure: ESOPHAGOGASTRODUODENOSCOPY (EGD);  Surgeon: Irene Shipper, MD;  Location: [facility];  Service: Endoscopy;  Laterality: N/A;  . ESOPHAGOGASTRODUODENOSCOPY N/A 04/30/2015   Procedure: ESOPHAGOGASTRODUODENOSCOPY (EGD);  Surgeon: Rogene Houston, MD;  [facility];  Service: Endoscopy;  Laterality: N/A;  1pm - moved to 10/20 @ 1:10  . ESOPHAGOGASTRODUODENOSCOPY N/A 07/29/2016   Procedure: ESOPHAGOGASTRODUODENOSCOPY (EGD);  Surgeon: Manus Gunning, MD;  Location: [place];  Service: Gastroenterology;  Laterality: N/A;  enteroscopy  . ESOPHAGOGASTRODUODENOSCOPY N/A 09/26/2019   Procedure: ESOPHAGOGASTRODUODENOSCOPY (EGD);  Surgeon: Rogene Houston, MD;  [facility];  Service: Endoscopy;  Laterality: N/A;  1250  . GIVENS CAPSULE STUDY N/A 03/07/2019   Procedure: GIVENS CAPSULE STUDY;  Surgeon: Rogene Houston, MD;  [facility];  Service: Endoscopy;  Laterality: N/A;  7:30  . INSERTION OF DIALYSIS CATHETER N/A 10/05/2020   Procedure: ABORTED TUNNELED DIALYSIS CATHETER PLACEMENT RIGHT INTERNAL JUGULAR VEIN ;  Surgeon: Virl Cagey, MD;  [facility];  Service: General;  Laterality: N/A;  . INTRAOPERATIVE TRANSESOPHAGEAL ECHOCARDIOGRAM  06/13/2012   Procedure: INTRAOPERATIVE TRANSESOPHAGEAL ECHOCARDIOGRAM;  Surgeon: Grace Isaac, MD;  Location: [facility];  Service: Open Heart Surgery;  Laterality: N/A;  . IR DIALY SHUNT INTRO NEEDLE/INTRACATH INITIAL W/IMG LEFT Left 10/06/2020  . IR FLUORO GUIDE CV LINE RIGHT  06/17/2020  . IR GENERIC HISTORICAL  07/26/2016   IR FLUORO GUIDE  CV LINE RIGHT 07/26/2016 Greggory Keen, MD [facility]  . IR GENERIC HISTORICAL  07/26/2016   IR US GUIDE VASC ACCESS RIGHT 07/26/2016 Greggory Keen, MD [facility]  . IR GENERIC HISTORICAL  08/02/2016   IR US GUIDE VASC ACCESS RIGHT 08/02/2016 Greggory Keen, MD [facility]  . IR GENERIC HISTORICAL  08/02/2016   IR FLUORO GUIDE CV LINE RIGHT 08/02/2016 Greggory Keen, MD [facility]  . IR RADIOLOGY PERIPHERAL GUIDED IV START  03/28/2017  . IR REMOVAL TUN CV CATH W/O FL  08/11/2020  . IR THROMBECTOMY AV FISTULA W/THROMBOLYSIS INC/SHUNT/IMG LEFT Left 06/17/2020  . IR US GUIDE VASC ACCESS LEFT  06/17/2020  . IR US GUIDE VASC ACCESS RIGHT  03/28/2017  . IR US GUIDE VASC ACCESS RIGHT  06/17/2020  . LEFT HEART CATH AND CORONARY ANGIOGRAPHY N/A 09/20/2016   Procedure: Left Heart Cath and Coronary Angiography;  Surgeon: [name], MD;  Location: [place] [facility];  Service: Cardiovascular;  Laterality: N/A;  . LEFT HEART CATH AND CORS/GRAFTS ANGIOGRAPHY N/A 10/13/2016   Procedure: Left Heart Cath and Cors/Grafts Angiography;  Surgeon: Troy Sine, MD;  Location: [place] [facility];  Service: Cardiovascular;  Laterality: N/A;  . LEFT HEART CATH AND CORS/GRAFTS ANGIOGRAPHY N/A 07/13/2018   Procedure: LEFT HEART CATH AND CORS/GRAFTS ANGIOGRAPHY;  Surgeon: Martinique, Peter M, MD;  Location: [place] [facility];  Service: Cardiovascular;  Laterality: N/A;  . LEFT HEART CATHETERIZATION WITH CORONARY ANGIOGRAM N/A 12/15/2011   Procedure: LEFT HEART CATHETERIZATION WITH CORONARY ANGIOGRAM;  Surgeon: Burnell Blanks, MD;  Location: [facility] CATH LAB;  Service: Cardiovascular;  Laterality: N/A;  . LEFT HEART CATHETERIZATION WITH CORONARY ANGIOGRAM N/A 01/10/2012   Procedure: LEFT HEART CATHETERIZATION WITH CORONARY ANGIOGRAM;  Surgeon: Peter M Martinique, MD;  Location: [facility] CATH LAB;  Service: Cardiovascular;  Laterality: N/A;  . LEFT HEART CATHETERIZATION WITH CORONARY ANGIOGRAM N/A 06/08/2012   Procedure: LEFT HEART  CATHETERIZATION WITH CORONARY ANGIOGRAM;  Surgeon: Burnell Blanks, MD;  Location: [facility] CATH LAB;  Service: Cardiovascular;  Laterality: N/A;  . LEFT HEART CATHETERIZATION WITH CORONARY/GRAFT ANGIOGRAM N/A 12/10/2013   Procedure: LEFT HEART CATHETERIZATION WITH Beatrix Fetters;  Surgeon: [name], MD;  Location: [facility] CATH LAB;  Service: Cardiovascular;  Laterality: N/A;  . OVARY SURGERY     ovarian cancer  . POLYPECTOMY  03/09/2019   Procedure: POLYPECTOMY;  Surgeon: Rogene Houston, MD;  [facility];  Service: Endoscopy;;  cecal   . POLYPECTOMY N/A 09/26/2019   Procedure: DUODENAL POLYPECTOMY;  Surgeon: Rogene Houston, MD;  [facility];  Service: Endoscopy;  Laterality: N/A;  . REVISION OF ARTERIOVENOUS GORETEX GRAFT N/A 02/24/2017   Procedure: REVISION OF ARTERIOVENOUS GORETEX GRAFT (RESECTION);  Surgeon: Katha Cabal, MD;  Location: [facility];  Service: Vascular;  Laterality: N/A;  . REVISON OF ARTERIOVENOUS FISTULA Left 06/19/2020   Procedure: REVISION OF LEFT UPPER ARM AV GRAFT WITH INTERPOSITION JUMP GRAFT USING 6MM GORE LIMB;  Surgeon: Marty Heck, MD;  Location: [place];  Service: Vascular;  Laterality: Left;  . SHUNTOGRAM N/A 10/15/2013   Procedure: Fistulogram;  Surgeon: Serafina Mitchell, MD;  Location: [facility] CATH LAB;  Service: Cardiovascular;  Laterality: N/A;  . THROMBECTOMY / ARTERIOVENOUS GRAFT REVISION  2011   left upper arm  . TUBAL LIGATION  1980's  . UPPER EXTREMITY ANGIOGRAPHY Bilateral 12/06/2016   Procedure: Upper Extremity Angiography;  Surgeon: Katha Cabal, MD;  Location: [place] [facility];  Service: Cardiovascular;  Laterality: Bilateral;  . UPPER EXTREMITY INTERVENTION Left 06/06/2017   Procedure: UPPER EXTREMITY INTERVENTION;  Surgeon: Katha Cabal, MD;  Location: [place] [facility];  Service: Cardiovascular;  Laterality:  Left;      Social History:      Social History   Tobacco Use  .  Smoking status: Never Smoker  . Smokeless tobacco: Never Used  Substance Use Topics  . Alcohol use: No    Alcohol/week: 0.0 standard drinks       Family History :     Family History  Problem Relation Age of Onset  . Heart disease Mother        Heart Disease before age 13  . Hyperlipidemia Mother   . Hypertension Mother   . Diabetes Mother   . Heart attack Mother   . Heart disease Father        Heart Disease before age 83  . Hyperlipidemia Father   . Hypertension Father   . Diabetes Father   . Diabetes Sister   . Hypertension Sister   . Diabetes Brother   . Hyperlipidemia Brother   . Heart attack Brother   . Hypertension Sister   . Heart attack Brother   . Colon cancer Child 76  . Other Other        noncontributory for early CAD  . Esophageal cancer Neg Hx   . Liver disease Neg Hx   . Kidney disease Neg Hx   . Colon polyps Neg Hx       Home Medications:   Prior to Admission medications   Medication Sig Start Date End Date Taking? Authorizing Provider  amiodarone (PACERONE) 200 MG tablet Take 1 tablet (200 mg total) by mouth daily. Starting July 2 12/17/19   Ahmed Prima Fransisco Hertz, PA-C  aspirin EC 81 MG tablet Take 81 mg by mouth daily.  09/27/19   Rehman, Mechele Dawley, MD  fluticasone (FLONASE) 50 MCG/ACT nasal spray Place 1 spray into both nostrils at bedtime as needed for allergies.     [provider]  gabapentin (NEURONTIN) 100 MG capsule Take 100 mg by mouth daily. 11/04/20   [provider]  isosorbide mononitrate (IMDUR) 60 MG 24 hr tablet Take 1 tablet (60 mg total) by mouth in the morning and at bedtime. 10/20/20 01/18/21  Erma Heritage, PA-C  lidocaine-prilocaine (EMLA) cream Apply 1 application topically every Monday, Wednesday, and Friday. Prior to dialysis    [provider]  loratadine (CLARITIN) 10 MG tablet Take 1 tablet (10 mg total) by mouth daily as needed for allergies. 07/14/18   Hosie Poisson, MD  multivitamin (RENA-VIT) TABS  tablet Take 1 tablet by mouth daily.    [provider]  nitroGLYCERIN (NITROSTAT) 0.4 MG SL tablet Place 1 tablet (0.4 mg total) under the tongue every 5 (five) minutes x 3 doses as needed for chest pain (if no relief after 2nd dose, proceed to the ED for an evaluation or call 911). 09/08/20   Verta Ellen., NP  omeprazole (PRILOSEC) 40 MG capsule Take 1 capsule (40 mg total) by mouth daily before breakfast. 03/31/20   Laurine Blazer B, PA-C  sevelamer carbonate (RENVELA) 800 MG tablet Take 2 tablets (1,600 mg total) by mouth 3 (three) times daily with meals. 06/20/20   Meccariello, Bernita Raisin, DO  simvastatin (ZOCOR) 20 MG tablet Take 1 tablet (20 mg total) by mouth at bedtime. 07/07/20   Verta Ellen., NP     Allergies:     Allergies  Allergen Reactions  . Aspirin Other (See Comments)    High Doses Mess up her stomach; "makes my bowels have blood in them". Takes 81 mg EC  Aspirin   . Penicillins Other (See Comments)    SYNCOPE? , "makes me real weak when I take it; like I'll pass out"  Has patient had a PCN reaction causing immediate rash, facial/tongue/throat swelling, SOB or lightheadedness with hypotension: Yes Has patient had a PCN reaction causing severe rash involving mucus membranes or skin necrosis: no Has patient had a PCN reaction that required hospitalization no Has patient had a PCN reaction occurring within the last 10 years: no If all of the above  . Amlodipine Swelling  . Bactrim [Sulfamethoxazole-Trimethoprim] Rash  . Contrast Media [Iodinated Diagnostic Agents] Itching  . Iron Itching and Other (See Comments)    "they gave me iron in dialysis; had to give me Benadryl cause I had to have the iron" (05/02/2012)  . Nitrofurantoin Hives  . Tylenol [Acetaminophen] Itching and Other (See Comments)    Makes her feet on fire per pt  . Gabapentin Other (See Comments)    Unknown reaction  . Hydralazine Itching  . Levofloxacin   . No Known Allergies   .  Ranexa [Ranolazine] Other (See Comments)    Myoclonus-hospitalized   . Dexilant [Dexlansoprazole] Other (See Comments)    Upset stomach  . Levaquin [Levofloxacin In D5w] Rash  . Morphine And Related Itching and Other (See Comments)    Itching in feet  . Plavix [Clopidogrel Bisulfate] Rash  . Protonix [Pantoprazole Sodium] Rash  . Venofer [Ferric Oxide] Itching and Other (See Comments)    Patient reports using Benadryl prior to doses as [facility]     Physical Exam:   Vitals  Blood pressure (!) 180/92, pulse 81, temperature 98 F (36.7 C), temperature source Axillary, resp. rate 20, height 5' (1.524 m), weight 68 kg, SpO2 98 %.  1.  General: Patient lying supine in bed, no acute distress at admission, later with increased work of breathing and visible respiratory distress  2. Psychiatric: Mood and behavior normal for situation, oriented x3, pleasant, cooperative with exam  3. Neurologic: Speech and language are normal, moves all 4 extremities voluntarily, myoclonic activity with active motion, and worsens with increased resistance on the muscles, no myoclonic activity noted at rest  4. HEENMT:  Head is atraumatic, normocephalic, pupils are reactive to light, neck is supple, trachea is midline  5. Respiratory : Lungs are clear to auscultation bilaterally at first, lungs with crackles upon transfer to the ICU, increased work of breathing, use of accessory muscles, tachypneic at time of transfer to the ICU  6. Cardiovascular : Heart rate is normal, rhythm is regular, no rubs or gallops, 2+ peripheral edema to the midshin  7. Gastrointestinal:  Abdomen is soft, nondistended, nontender to palpation, no palpable masses, bowel sounds active  8. Skin:  Skin is warm dry and intact no acute lesion on limited exam  9.Musculoskeletal:  2+ pitting edema, no acute deformity, no calf tenderness    Data Review:    CBC Recent Labs  Lab 11/09/20 1513  WBC 6.6  HGB 8.9*  HCT  30.5*  PLT 261  MCV 124.0*  MCH 36.2*  MCHC 29.2*  RDW 19.3*  LYMPHSABS 1.0  MONOABS 0.6  EOSABS 0.4  BASOSABS 0.1   ------------------------------------------------------------------------------------------------------------------  Results for orders placed or performed during the hospital encounter of 11/09/20 (from the past 48 hour(s))  Comprehensive metabolic panel     Status: Abnormal   Collection Time: 11/09/20  3:13 PM  Result Value Ref Range   Sodium 139 135 - 145 mmol/L  Potassium 4.7 3.5 - 5.1 mmol/L   Chloride 99 98 - 111 mmol/L   CO2 27 22 - 32 mmol/L   Glucose, Bld 88 70 - 99 mg/dL    Comment: Glucose reference range applies only to samples taken after fasting for at least 8 hours.   BUN 43 (H) 8 - 23 mg/dL   Creatinine, Ser 9.90 (H) 0.44 - 1.00 mg/dL   Calcium 10.4 (H) 8.9 - 10.3 mg/dL   Total Protein 7.0 6.5 - 8.1 g/dL   Albumin 3.6 3.5 - 5.0 g/dL   AST 8 (L) 15 - 41 U/L   ALT 13 0 - 44 U/L   Alkaline Phosphatase 80 38 - 126 U/L   Total Bilirubin 0.6 0.3 - 1.2 mg/dL   GFR, Estimated 4 (L) >60 mL/min    Comment: (NOTE) Calculated using the CKD-EPI Creatinine Equation (2021)    Anion gap 13 5 - 15    Comment: Performed at [facility], [address], [place], [place] [address]  CBC with Differential     Status: Abnormal   Collection Time: 11/09/20  3:13 PM  Result Value Ref Range   WBC 6.6 4.0 - 10.5 K/uL    Comment: WHITE COUNT CONFIRMED ON SMEAR   RBC 2.46 (L) 3.87 - 5.11 MIL/uL   Hemoglobin 8.9 (L) 12.0 - 15.0 g/dL   HCT 30.5 (L) 36.0 - 46.0 %   MCV 124.0 (H) 80.0 - 100.0 fL   MCH 36.2 (H) 26.0 - 34.0 pg   MCHC 29.2 (L) 30.0 - 36.0 g/dL   RDW 19.3 (H) 11.5 - 15.5 %   Platelets 261 150 - 400 K/uL    Comment: SPECIMEN CHECKED FOR CLOTS PLATELET COUNT CONFIRMED BY SMEAR    nRBC 0.0 0.0 - 0.2 %   Neutrophils Relative % 70 %   Neutro Abs 4.6 1.7 - 7.7 K/uL   Lymphocytes Relative 15 %   Lymphs Abs 1.0 0.7 - 4.0 K/uL   Monocytes Relative 8 %    Monocytes Absolute 0.6 0.1 - 1.0 K/uL   Eosinophils Relative 6 %   Eosinophils Absolute 0.4 0.0 - 0.5 K/uL   Basophils Relative 1 %   Basophils Absolute 0.1 0.0 - 0.1 K/uL   Immature Granulocytes 0 %   Abs Immature Granulocytes 0.02 0.00 - 0.07 K/uL   Polychromasia PRESENT     Comment: Performed at [facility], [address] [place], [place] [address]  TSH     Status: Abnormal   Collection Time: 11/09/20  3:46 PM  Result Value Ref Range   TSH 9.938 (H) 0.350 - 4.500 uIU/mL    Comment: Performed by a 3rd Generation assay with a functional sensitivity of <=0.01 uIU/mL. Performed at [facility], [address] [place], [place] [address]   Magnesium     Status: None   Collection Time: 11/09/20  3:46 PM  Result Value Ref Range   Magnesium 2.2 1.7 - 2.4 mg/dL    Comment: Performed at [facility], [address] [place], [place] [address]  Troponin I (High Sensitivity)     Status: Abnormal   Collection Time: 11/09/20 10:48 PM  Result Value Ref Range   Troponin I (High Sensitivity) 36 (H) <18 ng/L    Comment: (NOTE) Elevated high sensitivity troponin I (hsTnI) values and significant  changes across serial measurements may suggest ACS but many other  chronic and acute conditions are known to elevate hsTnI results.  Refer to the "Links" section for chest  pain algorithms and additional  guidance. Performed at [facility], [address] [place], [place] [address]    *Note: Due to a large number of results and/or encounters for the requested time period, some results have not been displayed. A complete set of results can be found in Results Review.    Chemistries  Recent Labs  Lab 11/09/20 1513 11/09/20 1546  NA 139  --   K 4.7  --   CL 99  --   CO2 27  --   GLUCOSE 88  --   BUN 43*  --   CREATININE 9.90*  --   CALCIUM 10.4*  --   MG  --  2.2  AST 8*  --   ALT 13  --   ALKPHOS 80  --   BILITOT 0.6  --     ------------------------------------------------------------------------------------------------------------------  ------------------------------------------------------------------------------------------------------------------ GFR: Estimated Creatinine Clearance: 3.9 mL/min (A) (by C-G formula based on SCr of 9.9 mg/dL (H)). Liver Function Tests: Recent Labs  Lab 11/09/20 1513  AST 8*  ALT 13  ALKPHOS 80  BILITOT 0.6  PROT 7.0  ALBUMIN 3.6   No results for input(s): LIPASE, AMYLASE in the last 168 hours. No results for input(s): AMMONIA in the last 168 hours. Coagulation Profile: No results for input(s): INR, PROTIME in the last 168 hours. Cardiac Enzymes: No results for input(s): CKTOTAL, CKMB, CKMBINDEX, TROPONINI in the last 168 hours. BNP (last 3 results) No results for input(s): PROBNP in the last 8760 hours. HbA1C: No results for input(s): HGBA1C in the last 72 hours. CBG: No results for input(s): GLUCAP in the last 168 hours. Lipid Profile: No results for input(s): CHOL, HDL, LDLCALC, TRIG, CHOLHDL, LDLDIRECT in the last 72 hours. Thyroid Function Tests: Recent Labs    11/09/20 1546  TSH 9.938*   Anemia Panel: No results for input(s): VITAMINB12, FOLATE, FERRITIN, TIBC, IRON, RETICCTPCT in the last 72 hours.  --------------------------------------------------------------------------------------------------------------- Urine analysis:    Component Value Date/Time   COLORURINE YELLOW 12/16/2012 1919   APPEARANCEUR CLOUDY (A) 12/16/2012 1919   LABSPEC 1.009 12/16/2012 1919   PHURINE 7.5 12/16/2012 1919   GLUCOSEU NEGATIVE 12/16/2012 1919   HGBUR TRACE (A) 12/16/2012 1919   BILIRUBINUR NEGATIVE 12/16/2012 1919   KETONESUR NEGATIVE 12/16/2012 1919   PROTEINUR 100 (A) 12/16/2012 1919   UROBILINOGEN 0.2 12/16/2012 1919   NITRITE NEGATIVE 12/16/2012 1919   LEUKOCYTESUR SMALL (A) 12/16/2012 1919      Imaging Results:    CT Head Wo Contrast  Result  Date: 11/09/2020 CLINICAL DATA:  Unsteady 8, bilateral leg weakness for 3 weeks, posterior headaches EXAM: CT HEAD WITHOUT CONTRAST TECHNIQUE: Contiguous axial images were obtained from the base of the skull through the vertex without intravenous contrast. COMPARISON:  08/30/2020 FINDINGS: Brain: Stable confluent hypodensities throughout the periventricular white matter compatible with chronic small vessel ischemic change. Chronic lacunar infarct left basal ganglia. No signs of acute infarct or hemorrhage. Lateral ventricles and remaining midline structures are unremarkable. No acute extra-axial fluid collections. No mass effect. Vascular: Stable diffuse atherosclerosis.  No hyperdense vessel. Skull: Normal. Negative for fracture or focal lesion. Sinuses/Orbits: No acute finding. Other: None. IMPRESSION: 1. Stable chronic small vessel ischemic changes. No acute intracranial process. Electronically Signed   By: Randa Ngo M.D.   On: 11/09/2020 17:18   DG CHEST PORT 1 VIEW  Result Date: 11/09/2020 CLINICAL DATA:  Increased shortness of breath EXAM: PORTABLE CHEST 1 VIEW COMPARISON:  11/09/2020, 10/05/2020 CT 02/26/2018 FINDINGS: Post sternotomy changes.  Cardiomegaly with vascular congestion and increased pulmonary edema. Small bilateral pleural effusions which appears slightly increased. Atelectasis versus mild infiltrate left lung base. No pneumothorax. Vascular stent in the left upper arm IMPRESSION: 1. Cardiomegaly with increased small pleural effusions, worsened vascular congestion and development of interstitial pulmonary edema. 2. Atelectasis versus developing infiltrate at the left base. Electronically Signed   By: Donavan Foil M.D.   On: 11/09/2020 22:47   DG Chest Portable 1 View  Result Date: 11/09/2020 CLINICAL DATA:  Cough for several days EXAM: PORTABLE CHEST 1 VIEW COMPARISON:  10/05/2020 FINDINGS: Cardiac shadow is enlarged but stable. Postsurgical changes are noted. Scarring is noted in the  left base and left mid lung stable from the prior exam. Right basilar scarring is noted as well. Mild vascular congestion is noted but improved from the prior study. No significant edema is seen. Vascular stenting is noted in the left arm. IMPRESSION: Stable scarring bilaterally. Mild vascular congestion likely related to a degree of volume overload. Electronically Signed   By: Inez Catalina M.D.   On: 11/09/2020 16:22    My personal review of EKG: Rhythm NSR, Rate 68/min, QTc 497 ,no Acute ST changes   Assessment & Plan:    Principal Problem:   Flash pulmonary edema (HCC) Active Problems:   ESRD on hemodialysis ([place])   Hypertensive crisis   Acute respiratory failure with hypoxia (HCC)   Myoclonus   Generalized weakness   Diabetes mellitus type 2 in nonobese ([place])   1. Flash pulmonary edema 1. Secondary to hypertensive crisis with history of diastolic CHF and ESRD 2. Placed on BiPAP 3. Monitor in ICU 4. Hydralazine given for blood pressure, not adequately improved we will start nitro drip 5. Likely to improve the most with dialysis 2. Hypertensive crisis 1. Blood pressure as high as 260/110 2. With flash pulmonary edema 3. See plan above 3. Acute respiratory failure with hypoxia 1. Secondary to above 2. With oxygen saturation as reported down into the 80s 3. Patient now on BiPAP 4. See treatment for #1 4. Generalized weakness and myoclonus 1. Patient has history of myoclonus, but now worsening 2. Generalized weakness is secondary to the myoclonus 3. Start Klonopin as needed 4. Continue to monitor 5. MRI in the a.m. 6. Monitor and replace electrolytes as needed with dialysis 7. TSH last checked is a metabolic cause of this myoclonus- elevated check free T4 T3 8. Neurology to see in the a.m. 5. ESRD 1. Consult nephro for dialysis 2. Nephrologist plans to dialyze her as soon as possible in the morning 6. Diabetes mellitus type 2 1. Last hemoglobin A1c was 8.7 2. Sliding  scale coverage 3. Diabetic diet 7.    DVT Prophylaxis-   Heparin- SCDs   AM Labs Ordered, also please review Full Orders  Family Communication: Admission, patients condition and plan of care including tests being ordered have been discussed with the patient and husband who indicate understanding and agree with the plan and Code Status.  Code Status: Full  Admission status: Observation Time spent in minutes :[place]

## 2020-11-10 NOTE — Progress Notes (Signed)
O2 via [place] removed O2 100% on room air No distress, SOB, CP Husband at bedside Tolerating dinner and po fluids with no issues Will cont to monitor

## 2020-11-10 NOTE — Progress Notes (Signed)
Date and time results received: 11/10/20 0935   Test: Troponin Critical Value: 96  Name of Provider Notified: Dr. Maryland Pink   Orders Received? Or Actions Taken?: MD aware

## 2020-11-10 NOTE — Progress Notes (Signed)
Checked on patient to see if she wanted to be put on Bipap (V60) tonight.  Patient stated that she had dialysis today and was feeling better with her breathing.  Patient is currently on 4L [place] with a sat of 98%.  She did not appear in any distress and did not want to go on tonight unless she has to.  Will continue to monitor.

## 2020-11-10 NOTE — Progress Notes (Signed)
[place] @ 2 L/min O2 sats 99-100% Tolerating water at bedside with no isses Denies CP, SOB Diet placed with approval from Dr. [place] Pink  Will cont to monitor

## 2020-11-11 DIAGNOSIS — N186 End stage renal disease: Secondary | ICD-10-CM

## 2020-11-11 DIAGNOSIS — Z992 Dependence on renal dialysis: Secondary | ICD-10-CM

## 2020-11-11 DIAGNOSIS — J9601 Acute respiratory failure with hypoxia: Secondary | ICD-10-CM

## 2020-11-11 DIAGNOSIS — G253 Myoclonus: Secondary | ICD-10-CM

## 2020-11-11 LAB — BASIC METABOLIC PANEL
Anion gap: 9 (ref 5–15)
BUN: 27 mg/dL — ABNORMAL HIGH (ref 8–23)
CO2: 29 mmol/L (ref 22–32)
Calcium: 10.2 mg/dL (ref 8.9–10.3)
Chloride: 97 mmol/L — ABNORMAL LOW (ref 98–111)
Creatinine, Ser: 6.64 mg/dL — ABNORMAL HIGH (ref 0.44–1.00)
GFR, Estimated: 6 mL/min — ABNORMAL LOW (ref >60)
Glucose, Bld: 98 mg/dL (ref 70–99)
Potassium: 3.7 mmol/L (ref 3.5–5.1)
Sodium: 135 mmol/L (ref 135–145)

## 2020-11-11 LAB — CBC
HCT: 27.3 % — ABNORMAL LOW (ref 36.0–46.0)
Hemoglobin: 8.3 g/dL — ABNORMAL LOW (ref 12.0–15.0)
MCH: 36.7 pg — ABNORMAL HIGH (ref 26.0–34.0)
MCHC: 30.4 g/dL (ref 30.0–36.0)
MCV: 120.8 fL — ABNORMAL HIGH (ref 80.0–100.0)
Platelets: 220 10*3/uL (ref 150–400)
RBC: 2.26 MIL/uL — ABNORMAL LOW (ref 3.87–5.11)
RDW: 19.3 % — ABNORMAL HIGH (ref 11.5–15.5)
WBC: 5.2 10*3/uL (ref 4.0–10.5)
nRBC: 0 % (ref 0.0–0.2)

## 2020-11-11 LAB — T3, FREE: T3, Free: 2 pg/mL (ref 2.0–4.4)

## 2020-11-11 MED ORDER — ASPIRIN 325 MG PO TABS
325.0000 mg | ORAL_TABLET | Freq: Once | ORAL | Status: AC
  Administered 2020-11-11: 325 mg via ORAL
  Filled 2020-11-11: qty 1

## 2020-11-11 MED ORDER — HYDRALAZINE HCL 20 MG/ML IJ SOLN
20.0000 mg | Freq: Once | INTRAMUSCULAR | Status: AC
  Administered 2020-11-11: 20 mg via INTRAVENOUS
  Filled 2020-11-11: qty 1

## 2020-11-11 NOTE — Discharge Summary (Signed)
Physician Discharge Summary  Shawna [name] JSE:[id number] DOB: [birth year] DOA: 11/09/2020  PCP: [facility]  Admit date: 11/09/2020 Discharge date: 11/11/2020  Admitted From: Home Disposition:  Home refuses SNF  Recommendations for Outpatient Follow-up:  1. Follow up with PCP in 1-2 weeks 2. Please obtain BMP/CBC in one week   Home Health: YES--HHPT   Discharge Condition: Stable CODE STATUS: FULL Diet recommendation: Heart Healthy   Brief/Interim Summary: 80 y/o femalewith medical history ofESRD(MWF)who normally dialyzes at [facility] in [place], diastolic CHF, hypertension, hyperlipidemia, colonic AVMs, TIA, coronary artery disease presenting withdifficulty ambulation secondary to myoclonic jerking.  Patient states that she has had intermittent jerking movements for about 1 month.  Apparently, the patient has been following neurology, Dr. Merlene Laughter.  She is on as needed clonazepam.  She also complained of intermittent chest pain.  She takes 1 single sublingual nitro with relief.  This has been an ongoing issue for her for which she was following cardiology.  She also complained of generalized weakness.  The patient missed dialysis on the day of her admission.  Her last dialysis session prior to admission was 11/06/2020. Shortly after admission patient had increased work of breathing and decrease in oxygen sats requiring oxygen supplementation.  Her initial chest x-ray showed mild vascular congestion related to fluid overload.  Repeat chest x-ray showed development of interstitial edema.  Patient was transferred to stepdown, started on BiPAP.  The patient was dialyzed on 11/09/2020 and 11/10/2020.  She was subsequently weaned off of supplemental oxygen.  She remained stable on room air.  Neurology was contacted about her myoclonus and thought this was metabolic secondary to her ESRD status and gabapentin.  After dialysis, the patient's myoclonus improved.  PT evaluated the patient and  recommended SNF.  The patient refused.  The patient will be discharged home in stable condition. Please note that dialysis has been arranged for the patient at her usual dialysis center on 11/12/2020 at 1145, and once again during her usual day on 11/13/2020.  Discharge Diagnoses:  Acute respiratory failure with hypoxia -Secondary to pulmonary edema -Initially on BiPAP -Weaned to room air prior to discharge  Uncontrolled hypertension -This has been a recurrent problem for the patient -initial BP 260/110 -initially on NTG drip-->weaned off -Suspect there is a degree of poor compliance at home as her blood pressure is usually well controlled during her hospitalizations  Generalized weakness/myoclonus -Secondary to metabolic derangement from her missing dialysis, ESRD status, and gabapentin -Improved with dialysis -PT recommended SNF which the patient refused -Set up home health PT -continue prn klonopin  ESRD -Last dialyzed 11/10/2020 -Dialysis nurse has contacted her dialysis center who will arrange for dialysis on 11/12/2020, and once again on her usual day on 11/13/2020  Paroxysmal Afib/NSVT -Not on AC due to GIB -CHADS VASc--5 -- She has been on Amiodarone 200mg  daily since at least 2017 and has tolerated this well -TSH 9.938, Free T4 0.80  Coronary Artery Disease s/p CABG in 2013 with DES to D1 in 10/2016 with cath showing patent LIMA-LAD, SVG-RI, SVG-Mrg and SVG-RCA and repeat cath in 07/2018 showingpatentgrafts with occlusion of D1 at prior stent site and progression of PDA disease and medical management was recommended -continue imdur, ASA 81 mg, simavastatin  HLD -continue statin   Discharge Instructions   Allergies as of 11/11/2020      Reactions   Aspirin Other (See Comments)   High Doses Mess up her stomach; "makes my bowels have blood in them". Takes 81 mg  EC Aspirin    Penicillins Other (See Comments)   SYNCOPE? , "makes me real weak when I take it; like I'll pass  out" Has patient had a PCN reaction causing immediate rash, facial/tongue/throat swelling, SOB or lightheadedness with hypotension: Yes Has patient had a PCN reaction causing severe rash involving mucus membranes or skin necrosis: no Has patient had a PCN reaction that required hospitalization no Has patient had a PCN reaction occurring within the last 10 years: no If all of the above   Amlodipine Swelling   Bactrim [sulfamethoxazole-trimethoprim] Rash   Contrast Media [iodinated Diagnostic Agents] Itching   Iron Itching, Other (See Comments)   "they gave me iron in dialysis; had to give me Benadryl cause I had to have the iron" (05/02/2012)   Nitrofurantoin Hives   Tylenol [acetaminophen] Itching, Other (See Comments)   Makes her feet on fire per pt   Gabapentin Other (See Comments)   Unknown reaction   Hydralazine Itching   Levofloxacin    No Known Allergies    Ranexa [ranolazine] Other (See Comments)   Myoclonus-hospitalized    Dexilant [dexlansoprazole] Other (See Comments)   Upset stomach   Levaquin [levofloxacin In D5w] Rash   Morphine And Related Itching, Other (See Comments)   Itching in feet   Plavix [clopidogrel Bisulfate] Rash   Protonix [pantoprazole Sodium] Rash   Venofer [ferric Oxide] Itching, Other (See Comments)   Patient reports using Benadryl prior to doses as [place]      Medication List    STOP taking these medications   gabapentin 100 MG capsule Commonly known as: NEURONTIN     TAKE these medications   amiodarone 200 MG tablet Commonly known as: PACERONE Take 1 tablet (200 mg total) by mouth daily. Starting July 2   aspirin EC 81 MG tablet Take 81 mg by mouth daily.   fluticasone 50 MCG/ACT nasal spray Commonly known as: FLONASE Place 1 spray into both nostrils at bedtime as needed for allergies.   isosorbide mononitrate 60 MG 24 hr tablet Commonly known as: IMDUR Take 1 tablet (60 mg total) by mouth in the morning and at bedtime.    lidocaine-prilocaine cream Commonly known as: EMLA Apply 1 application topically every Monday, Wednesday, and Friday. Prior to dialysis   loratadine 10 MG tablet Commonly known as: CLARITIN Take 1 tablet (10 mg total) by mouth daily as needed for allergies.   multivitamin Tabs tablet Take 1 tablet by mouth daily.   nitroGLYCERIN 0.4 MG SL tablet Commonly known as: NITROSTAT Place 1 tablet (0.4 mg total) under the tongue every 5 (five) minutes x 3 doses as needed for chest pain (if no relief after 2nd dose, proceed to the ED for an evaluation or call 911).   omeprazole 40 MG capsule Commonly known as: PRILOSEC Take 1 capsule (40 mg total) by mouth daily before breakfast.   sevelamer carbonate 800 MG tablet Commonly known as: RENVELA Take 2 tablets (1,600 mg total) by mouth 3 (three) times daily with meals.   simvastatin 20 MG tablet Commonly known as: ZOCOR Take 1 tablet (20 mg total) by mouth at bedtime.       Allergies  Allergen Reactions  . Aspirin Other (See Comments)    High Doses Mess up her stomach; "makes my bowels have blood in them". Takes 81 mg EC Aspirin   . Penicillins Other (See Comments)    SYNCOPE? , "makes me real weak when I take it; like I'll pass out"  Has patient had a PCN reaction causing immediate rash, facial/tongue/throat swelling, SOB or lightheadedness with hypotension: Yes Has patient had a PCN reaction causing severe rash involving mucus membranes or skin necrosis: no Has patient had a PCN reaction that required hospitalization no Has patient had a PCN reaction occurring within the last 10 years: no If all of the above  . Amlodipine Swelling  . Bactrim [Sulfamethoxazole-Trimethoprim] Rash  . Contrast Media [Iodinated Diagnostic Agents] Itching  . Iron Itching and Other (See Comments)    "they gave me iron in dialysis; had to give me Benadryl cause I had to have the iron" (05/02/2012)  . Nitrofurantoin Hives  . Tylenol [Acetaminophen]  Itching and Other (See Comments)    Makes her feet on fire per pt  . Gabapentin Other (See Comments)    Unknown reaction  . Hydralazine Itching  . Levofloxacin   . No Known Allergies   . Ranexa [Ranolazine] Other (See Comments)    Myoclonus-hospitalized   . Dexilant [Dexlansoprazole] Other (See Comments)    Upset stomach  . Levaquin [Levofloxacin In D5w] Rash  . Morphine And Related Itching and Other (See Comments)    Itching in feet  . Plavix [Clopidogrel Bisulfate] Rash  . Protonix [Pantoprazole Sodium] Rash  . Venofer [Ferric Oxide] Itching and Other (See Comments)    Patient reports using Benadryl prior to doses as [place]    Consultations:  renal   Procedures/Studies: CT Head Wo Contrast  Result Date: 11/09/2020 CLINICAL DATA:  Unsteady 8, bilateral leg weakness for 3 weeks, posterior headaches EXAM: CT HEAD WITHOUT CONTRAST TECHNIQUE: Contiguous axial images were obtained from the base of the skull through the vertex without intravenous contrast. COMPARISON:  08/30/2020 FINDINGS: Brain: Stable confluent hypodensities throughout the periventricular white matter compatible with chronic small vessel ischemic change. Chronic lacunar infarct left basal ganglia. No signs of acute infarct or hemorrhage. Lateral ventricles and remaining midline structures are unremarkable. No acute extra-axial fluid collections. No mass effect. Vascular: Stable diffuse atherosclerosis.  No hyperdense vessel. Skull: Normal. Negative for fracture or focal lesion. Sinuses/Orbits: No acute finding. Other: None. IMPRESSION: 1. Stable chronic small vessel ischemic changes. No acute intracranial process. Electronically Signed   By: Randa Ngo M.D.   On: 11/09/2020 17:18   MR BRAIN WO CONTRAST  Result Date: 11/10/2020 CLINICAL DATA:  Neuro deficit, acute, stroke suspected. Additional history provided: Difficulty walking secondary to jerking, history of TIA. EXAM: MRI HEAD WITHOUT CONTRAST TECHNIQUE:  Multiplanar, multiecho pulse sequences of the brain and surrounding structures were obtained without intravenous contrast. COMPARISON:  Prior head CT examinations 11/09/2020 and earlier. Report from brain MRI 02/06/2019 (images currently unavailable). FINDINGS: Brain: Mild-to-moderate generalized parenchymal atrophy. Chronic lacunar infarcts within the bilateral basal ganglia. Background severe patchy and confluent T2/FLAIR hyperintensity within the cerebral white matter, nonspecific but compatible with chronic small vessel ischemic disease. To a lesser degree, chronic small vessel ischemic changes are also present within the pons. There are two chronic lacunar infarcts within the left cerebellar hemisphere. There are a few scattered nonspecific supratentorial and infratentorial chronic microhemorrhages. There is no acute infarct. No evidence of intracranial mass. No extra-axial fluid collection. No midline shift. Vascular: Expected proximal arterial flow voids. Skull and upper cervical spine: No focal marrow lesion. Incompletely assessed cervical spondylosis. Sinuses/Orbits: Visualized orbits show no acute finding. Trace bilateral ethmoid sinus mucosal thickening. Small volume secretions layering within the left sphenoid sinus. IMPRESSION: No evidence of acute intracranial abnormality. Chronic lacunar infarcts within the  bilateral basal ganglia and left cerebellar hemisphere. Background severe cerebral white matter chronic small vessel ischemic disease. Mild chronic small vessel ischemic changes are also present within the pons. Mild-to-moderate generalized parenchymal atrophy. Left sphenoid sinusitis. Electronically Signed   By: Kellie Simmering DO   On: 11/10/2020 16:24   DG CHEST PORT 1 VIEW  Result Date: 11/09/2020 CLINICAL DATA:  Increased shortness of breath EXAM: PORTABLE CHEST 1 VIEW COMPARISON:  11/09/2020, 10/05/2020 CT 02/26/2018 FINDINGS: Post sternotomy changes. Cardiomegaly with vascular congestion and  increased pulmonary edema. Small bilateral pleural effusions which appears slightly increased. Atelectasis versus mild infiltrate left lung base. No pneumothorax. Vascular stent in the left upper arm IMPRESSION: 1. Cardiomegaly with increased small pleural effusions, worsened vascular congestion and development of interstitial pulmonary edema. 2. Atelectasis versus developing infiltrate at the left base. Electronically Signed   By: Donavan Foil M.D.   On: 11/09/2020 22:47   DG Chest Portable 1 View  Result Date: 11/09/2020 CLINICAL DATA:  Cough for several days EXAM: PORTABLE CHEST 1 VIEW COMPARISON:  10/05/2020 FINDINGS: Cardiac shadow is enlarged but stable. Postsurgical changes are noted. Scarring is noted in the left base and left mid lung stable from the prior exam. Right basilar scarring is noted as well. Mild vascular congestion is noted but improved from the prior study. No significant edema is seen. Vascular stenting is noted in the left arm. IMPRESSION: Stable scarring bilaterally. Mild vascular congestion likely related to a degree of volume overload. Electronically Signed   By: Inez Catalina M.D.   On: 11/09/2020 16:22   VAS US DUPLEX DIALYSIS ACCESS (AVF,AVG)  Result Date: 11/05/2020 DIALYSIS ACCESS Patient Name:  Shawna [name]  Date of Exam:   11/05/2020 Medical Rec #: [id number]     Accession #:    [id number] Date of Birth: Jan 01, 1941     Patient Gender: F Patient Age:   45Y Exam Location:  [facility] Procedure:      VAS US DUPLEX DIALYSIS ACCESS (AVF, AVG) Referring Phys: [id number] [name] --------------------------------------------------------------------------------  Reason for Exam: Routine follow up. Access Site: Left Upper Extremity. Access Type: Brach Ax AVG. History: 03/19/2019 PTA and stent venous outflow left upper arm AV access. Comparison Study: 04/16/20 Performing Technologist: Concha Norway RVT  Examination Guidelines: A complete evaluation includes B-mode imaging,  spectral Doppler, color Doppler, and power Doppler as needed of all accessible portions of each vessel. Unilateral testing is considered an integral part of a complete examination. Limited examinations for reoccurring indications may be performed as noted.  Findings:   +--------------------+----------+-----------------+--------+ AVG                 PSV (cm/s)Flow Vol (mL/min)Describe +--------------------+----------+-----------------+--------+ Native artery inflow   292          1988                +--------------------+----------+-----------------+--------+ Arterial anastomosis   481                              +--------------------+----------+-----------------+--------+ Prox graft             299                              +--------------------+----------+-----------------+--------+ Mid graft              273                              +--------------------+----------+-----------------+--------+  Distal graft           302                              +--------------------+----------+-----------------+--------+ Venous anastomosis     180                              +--------------------+----------+-----------------+--------+ Venous outflow         109                              +--------------------+----------+-----------------+--------+ +---------------+------------+----------+---------+--------+------------------+                  Diameter  Depth (cm)Branching  PSV      Flow Volume                        (cm)                        (cm/s)      (ml/min)      +---------------+------------+----------+---------+--------+------------------+ Left Rad Art                                     46                      dis                                                                      +---------------+------------+----------+---------+--------+------------------+ Antegrade                                                                 +---------------+------------+----------+---------+--------+------------------+  Summary: Patent arteriovenous graft. *See table(s) above for measurements and observations.  Diagnosing physician: Hortencia Pilar MD Electronically signed by Hortencia Pilar MD on 11/05/2020 at 5:49:59 PM.   --------------------------------------------------------------------------------   Final         Discharge Exam: Vitals:   11/11/20 0000 11/11/20 0400  BP:    Pulse:    Resp:    Temp: 97.9 F (36.6 C) 98 F (36.7 C)  SpO2:     Vitals:   11/10/20 1637 11/10/20 2000 11/11/20 0000 11/11/20 0400  BP:      Pulse: 74     Resp: 13     Temp: 98.2 F (36.8 C) 98.3 F (36.8 C) 97.9 F (36.6 C) 98 F (36.7 C)  TempSrc: Oral Oral  Oral  SpO2: 96%     Weight:      Height:        General: Pt is alert, awake, not in acute distress Cardiovascular: RRR, S1/S2 +, no rubs, no gallops Respiratory: CTA bilaterally, no wheezing, no rhonchi Abdominal: Soft, NT, ND, bowel sounds + Extremities: no edema, no cyanosis  The results of significant diagnostics from this hospitalization (including imaging, microbiology, ancillary and laboratory) are listed below for reference.    Significant Diagnostic Studies: CT Head Wo Contrast  Result Date: 11/09/2020 CLINICAL DATA:  Unsteady 8, bilateral leg weakness for 3 weeks, posterior headaches EXAM: CT HEAD WITHOUT CONTRAST TECHNIQUE: Contiguous axial images were obtained from the base of the skull through the vertex without intravenous contrast. COMPARISON:  08/30/2020 FINDINGS: Brain: Stable confluent hypodensities throughout the periventricular white matter compatible with chronic small vessel ischemic change. Chronic lacunar infarct left basal ganglia. No signs of acute infarct or hemorrhage. Lateral ventricles and remaining midline structures are unremarkable. No acute extra-axial fluid collections. No mass effect. Vascular: Stable diffuse atherosclerosis.  No  hyperdense vessel. Skull: Normal. Negative for fracture or focal lesion. Sinuses/Orbits: No acute finding. Other: None. IMPRESSION: 1. Stable chronic small vessel ischemic changes. No acute intracranial process. Electronically Signed   By: Randa Ngo M.D.   On: 11/09/2020 17:18   MR BRAIN WO CONTRAST  Result Date: 11/10/2020 CLINICAL DATA:  Neuro deficit, acute, stroke suspected. Additional history provided: Difficulty walking secondary to jerking, history of TIA. EXAM: MRI HEAD WITHOUT CONTRAST TECHNIQUE: Multiplanar, multiecho pulse sequences of the brain and surrounding structures were obtained without intravenous contrast. COMPARISON:  Prior head CT examinations 11/09/2020 and earlier. Report from brain MRI 02/06/2019 (images currently unavailable). FINDINGS: Brain: Mild-to-moderate generalized parenchymal atrophy. Chronic lacunar infarcts within the bilateral basal ganglia. Background severe patchy and confluent T2/FLAIR hyperintensity within the cerebral white matter, nonspecific but compatible with chronic small vessel ischemic disease. To a lesser degree, chronic small vessel ischemic changes are also present within the pons. There are two chronic lacunar infarcts within the left cerebellar hemisphere. There are a few scattered nonspecific supratentorial and infratentorial chronic microhemorrhages. There is no acute infarct. No evidence of intracranial mass. No extra-axial fluid collection. No midline shift. Vascular: Expected proximal arterial flow voids. Skull and upper cervical spine: No focal marrow lesion. Incompletely assessed cervical spondylosis. Sinuses/Orbits: Visualized orbits show no acute finding. Trace bilateral ethmoid sinus mucosal thickening. Small volume secretions layering within the left sphenoid sinus. IMPRESSION: No evidence of acute intracranial abnormality. Chronic lacunar infarcts within the bilateral basal ganglia and left cerebellar hemisphere. Background severe cerebral  white matter chronic small vessel ischemic disease. Mild chronic small vessel ischemic changes are also present within the pons. Mild-to-moderate generalized parenchymal atrophy. Left sphenoid sinusitis. Electronically Signed   By: Kellie Simmering DO   On: 11/10/2020 16:24   DG CHEST PORT 1 VIEW  Result Date: 11/09/2020 CLINICAL DATA:  Increased shortness of breath EXAM: PORTABLE CHEST 1 VIEW COMPARISON:  11/09/2020, 10/05/2020 CT 02/26/2018 FINDINGS: Post sternotomy changes. Cardiomegaly with vascular congestion and increased pulmonary edema. Small bilateral pleural effusions which appears slightly increased. Atelectasis versus mild infiltrate left lung base. No pneumothorax. Vascular stent in the left upper arm IMPRESSION: 1. Cardiomegaly with increased small pleural effusions, worsened vascular congestion and development of interstitial pulmonary edema. 2. Atelectasis versus developing infiltrate at the left base. Electronically Signed   By: Donavan Foil M.D.   On: 11/09/2020 22:47   DG Chest Portable 1 View  Result Date: 11/09/2020 CLINICAL DATA:  Cough for several days EXAM: PORTABLE CHEST 1 VIEW COMPARISON:  10/05/2020 FINDINGS: Cardiac shadow is enlarged but stable. Postsurgical changes are noted. Scarring is noted in the left base and left mid lung stable from the prior exam. Right basilar scarring is noted as well. Mild vascular congestion is noted but improved from  the prior study. No significant edema is seen. Vascular stenting is noted in the left arm. IMPRESSION: Stable scarring bilaterally. Mild vascular congestion likely related to a degree of volume overload. Electronically Signed   By: Inez Catalina M.D.   On: 11/09/2020 16:22   VAS US DUPLEX DIALYSIS ACCESS (AVF,AVG)  Result Date: 11/05/2020 DIALYSIS ACCESS Patient Name:  Shawna [name]  Date of Exam:   11/05/2020 Medical Rec #: [id number]     Accession #:    [id number] Date of Birth: Dec 05, 1940     Patient Gender: F Patient Age:   41Y Exam  Location:  [facility] Procedure:      VAS US DUPLEX DIALYSIS ACCESS (AVF, AVG) Referring Phys: [id number] [place] --------------------------------------------------------------------------------  Reason for Exam: Routine follow up. Access Site: Left Upper Extremity. Access Type: Brach Ax AVG. History: 03/19/2019 PTA and stent venous outflow left upper arm AV access. Comparison Study: 04/16/20 Performing Technologist: Concha Norway RVT  Examination Guidelines: A complete evaluation includes B-mode imaging, spectral Doppler, color Doppler, and power Doppler as needed of all accessible portions of each vessel. Unilateral testing is considered an integral part of a complete examination. Limited examinations for reoccurring indications may be performed as noted.  Findings:   +--------------------+----------+-----------------+--------+ AVG                 PSV (cm/s)Flow Vol (mL/min)Describe +--------------------+----------+-----------------+--------+ Native artery inflow   292          1988                +--------------------+----------+-----------------+--------+ Arterial anastomosis   481                              +--------------------+----------+-----------------+--------+ Prox graft             299                              +--------------------+----------+-----------------+--------+ Mid graft              273                              +--------------------+----------+-----------------+--------+ Distal graft           302                              +--------------------+----------+-----------------+--------+ Venous anastomosis     180                              +--------------------+----------+-----------------+--------+ Venous outflow         109                              +--------------------+----------+-----------------+--------+ +---------------+------------+----------+---------+--------+------------------+                  Diameter  Depth  (cm)Branching  PSV      Flow Volume                        (cm)                        (cm/s)      (  ml/min)      +---------------+------------+----------+---------+--------+------------------+ Left Rad Art                                     46                      dis                                                                      +---------------+------------+----------+---------+--------+------------------+ Antegrade                                                                +---------------+------------+----------+---------+--------+------------------+  Summary: Patent arteriovenous graft. *See table(s) above for measurements and observations.  Diagnosing physician: Hortencia Pilar MD Electronically signed by Hortencia Pilar MD on 11/05/2020 at 5:49:59 PM.   --------------------------------------------------------------------------------   Final      Microbiology: Recent Results (from the past 240 hour(s))  MRSA PCR Screening     Status: None   Collection Time: 11/10/20  1:21 AM   Specimen: Nasal Mucosa; Nasopharyngeal  Result Value Ref Range Status   MRSA by PCR NEGATIVE NEGATIVE Final    Comment:        The GeneXpert MRSA Assay (FDA approved for NASAL specimens only), is one component of a comprehensive MRSA colonization surveillance program. It is not intended to diagnose MRSA infection nor to guide or monitor treatment for MRSA infections. Performed at [facility], [name] [place], [place] [address]      Labs: Basic Metabolic Panel: Recent Labs  Lab 11/09/20 1513 11/09/20 1546 11/10/20 0933 11/11/20 0618  NA 139  --  139 135  K 4.7  --  4.4 3.7  CL 99  --  101 97*  CO2 27  --  25 29  GLUCOSE 88  --  86 98  BUN 43*  --  53* 27*  CREATININE 9.90*  --  12.01* 6.64*  CALCIUM 10.4*  --  10.1 10.2  MG  --  2.2  --   --   PHOS  --   --  6.3*  --    Liver Function Tests: Recent Labs  Lab 11/09/20 1513 11/10/20 0933  AST  8*  --   ALT 13  --   ALKPHOS 80  --   BILITOT 0.6  --   PROT 7.0  --   ALBUMIN 3.6 3.1*   No results for input(s): LIPASE, AMYLASE in the last 168 hours. No results for input(s): AMMONIA in the last 168 hours. CBC: Recent Labs  Lab 11/09/20 1513 11/10/20 0933 11/11/20 0618  WBC 6.6 6.3 5.2  NEUTROABS 4.6  --   --   HGB 8.9* 8.2* 8.3*  HCT 30.5* 26.6* 27.3*  MCV 124.0* 121.5* 120.8*  PLT 261 258 220   Cardiac Enzymes: No results for input(s): CKTOTAL, CKMB, CKMBINDEX, TROPONINI in the last 168 hours. BNP: Invalid input(s): POCBNP CBG: No results for input(s):  GLUCAP in the last 168 hours.  Time coordinating discharge:  36 minutes  Signed:  Orson Eva, DO [facility] Pager: [phone] 11/11/2020, 10:01 AM

## 2020-11-11 NOTE — Evaluation (Signed)
Occupational Therapy Evaluation Patient Details Name: Shawna [name] MRN: [id number] DOB: 07-08-1941 Today's Date: 11/11/2020    History of Present Illness Shawna [name]  is a 80 y.o. female, with history of TIA, peptic ulcer disease, paroxysmal atrial fibrillation, mitral regurg, hypertension, hypercholesterol, GERD, ESRD on hemodialysis, chronic diastolic CHF, and more presents the ED with a chief complaint of difficulty walking secondary to jerking.  Patient reports that she has been having muscle spasms for the last 2 months.  Her doctor started her on a medication that she cannot recall the name of, and it has not helped.  And then in the last 24 hours she started having her knees buckle when she is walking.  She reports that it feels like she is having spasms in her knees and legs as well as her arms and hands bilaterally.  It is better but not resolved at rest, and it was worse with active motion.  She reports weakness in her legs secondary to this jerking.  The weakness is also bilateral started over the last 3 days, but worsening in the last 24 hours.  Patient admits to pain in her lower back that is chronic and is worse with standing for long periods of time.  She reports that she has pain medication to take at home for that.  She also reports intermittent chest pain that is been going on so long as she can remember, for which she takes nitro glycerin every other day.  Patient reports a dry cough that is worse at night.  She reports dyspnea was worse on exertion and has been present chronically.  Patient is an ESRD patient with no urine output at all.  She missed dialysis today, and her last dialysis was 4/29.  Patient denies any nausea vomiting diarrhea or skin rashes.  She does report chronic pruritus.  She has a PCP appointment coming up for this problem.  Patient has no other complaints at the time of admission.   Clinical Impression   Pt agreeable to OT/PT evaluation. Pt demonstrates mild  limitations in UE A/ROM and P/ROM likely at baseline level.  3+/5 MMT for shoulder strength. Pt requires minimal assist for sit to stand and stand pivot transfers using RW. More Mod to Min A needed for functional ambulation with RW. Pt already discharged upon documentation of session. Pt recommended for further therapy in a rehab setting or at home health if husband is able to provide needed support as pt has reported.     Follow Up Recommendations  SNF;Home health OT (Recommended for SNF if husband [place] unable to support at home.)    Equipment Recommendations  None recommended by OT           Precautions / Restrictions Precautions Precautions: Fall Restrictions Weight Bearing Restrictions: No      Mobility Bed Mobility Overal bed mobility: Needs Assistance Bed Mobility: Supine to Sit     Supine to sit: Supervision     General bed mobility comments: increased time, labored movement    Transfers Overall transfer level: Needs assistance Equipment used: Rolling walker (2 wheeled) Transfers: Sit to/from [facility] Sit to Stand: Min assist Stand pivot transfers: Min assist       General transfer comment: unsteady labored movement, required use of RW for safety    Balance Overall balance assessment: Needs assistance Sitting-balance support: Feet supported;No upper extremity supported Sitting balance-Leahy Scale: Good Sitting balance - Comments: seated at EOB   Standing balance support: During functional  activity;No upper extremity supported Standing balance-Leahy Scale: Poor Standing balance comment: fair using RW                           ADL either performed or assessed with clinical judgement   ADL Overall ADL's : Needs assistance/impaired                         Toilet Transfer: Minimal assistance;Ambulation;RW Toilet Transfer Details (indicate cue type and reason): simulated via ambulatory transfer from EOB to chair using  RW.                 Vision Baseline Vision/History: Wears glasses Wears Glasses: At all times Patient Visual Report: No change from baseline                  Pertinent Vitals/Pain Pain Assessment: No/denies pain     Hand Dominance Right   Extremity/Trunk Assessment Upper Extremity Assessment Upper Extremity Assessment: Generalized weakness (Bilateral UE limitation of A/ROM and P/ROM to ~170*. Pt reported being weaker in shoulder than is typical. 3+/5 for shoulder MMT.)   Lower Extremity Assessment Lower Extremity Assessment: Defer to PT evaluation   Cervical / Trunk Assessment Cervical / Trunk Assessment: Normal   Communication Communication Communication: No difficulties   Cognition Arousal/Alertness: Awake/alert Behavior During Therapy: WFL for tasks assessed/performed Overall Cognitive Status: Within Functional Limits for tasks assessed                                                      Home Living Family/patient expects to be discharged to:: Private residence Living Arrangements: Spouse/significant other Available Help at Discharge: Family;Available PRN/intermittently Type of Home: House Home Access: Stairs to enter [employer] of Steps: 1 Entrance Stairs-Rails: Right Home Layout: One level     Bathroom Shower/Tub: Teacher, early years/pre: Standard Bathroom Accessibility: Yes How Accessible: Accessible via walker Home Equipment: Cane - single point;Walker - 4 wheels          Prior Functioning/Environment Level of Independence: Needs assistance  Gait / Transfers Assistance Needed: indpendent amulation with single point cane for long distances ADL's / Homemaking Assistance Needed: Assist times for bathing and dressing. Pt completes some cooking and cleaning per report. Husband also assists.   Comments: household ambulator without AD, uses SPC for longer distances        OT Problem List: Decreased  strength;Decreased activity tolerance;Impaired balance (sitting and/or standing);Decreased coordination            OT Goals(Current goals can be found in the care plan section) Acute Rehab OT Goals Patient Stated Goal: return home with family to assist                   Co-evaluation PT/OT/SLP Co-Evaluation/Treatment: Yes Reason for Co-Treatment: To address functional/ADL transfers   OT goals addressed during session: ADL's and self-care;Strengthening/ROM                       End of Session Equipment Utilized During Treatment: Rolling walker  Activity Tolerance: Patient tolerated treatment well Patient left: in chair;with call bell/phone within reach  OT Visit Diagnosis: Unsteadiness on feet (R26.81);Muscle weakness (generalized) (M62.81)  Time: 2003-7944 OT Time Calculation (min): 23 min Charges:  OT General Charges $OT Visit: 1 Visit OT Evaluation $OT Eval Low Complexity: [place] OT, MOT   Larey Seat 11/11/2020, 3:53 PM

## 2020-11-11 NOTE — Progress Notes (Signed)
Patient ID: Shawna [name], female   DOB: February 13, 1941, 80 y.o.   MRN: [id number] S: Feels much better today. O:BP 123/87   Pulse 74   Temp 98 F (36.7 C) (Oral)   Resp 13   Ht 5' (1.524 m)   Wt 62.8 kg   SpO2 96%   BMI 27.04 kg/m   Intake/Output Summary (Last 24 hours) at 11/11/2020 1032 Last data filed at 11/10/2020 1517 Gross per 24 hour  Intake 36.86 ml  Output 536 ml  Net -499.14 ml   Intake/Output: I/O last 3 completed shifts: In: 36.9 [I.V.:36.9] Out: 536 [Other:536]  Intake/Output this shift:  No intake/output data recorded. Weight change: 0.6 kg Gen: NAD CVS: RRR Resp: CTA Abd: benign Ext: no edema, LUE AVF +T/B  Recent Labs  Lab 11/09/20 1513 11/10/20 0933 11/11/20 0618  NA 139 139 135  K 4.7 4.4 3.7  CL 99 101 97*  CO2 27 25 29   GLUCOSE 88 86 98  BUN 43* 53* 27*  CREATININE 9.90* 12.01* 6.64*  ALBUMIN 3.6 3.1*  --   CALCIUM 10.4* 10.1 10.2  PHOS  --  6.3*  --   AST 8*  --   --   ALT 13  --   --    Liver Function Tests: Recent Labs  Lab 11/09/20 1513 11/10/20 0933  AST 8*  --   ALT 13  --   ALKPHOS 80  --   BILITOT 0.6  --   PROT 7.0  --   ALBUMIN 3.6 3.1*   No results for input(s): LIPASE, AMYLASE in the last 168 hours. No results for input(s): AMMONIA in the last 168 hours. CBC: Recent Labs  Lab 11/09/20 1513 11/10/20 0933 11/11/20 0618  WBC 6.6 6.3 5.2  NEUTROABS 4.6  --   --   HGB 8.9* 8.2* 8.3*  HCT 30.5* 26.6* 27.3*  MCV 124.0* 121.5* 120.8*  PLT 261 258 220   Cardiac Enzymes: No results for input(s): CKTOTAL, CKMB, CKMBINDEX, TROPONINI in the last 168 hours. CBG: No results for input(s): GLUCAP in the last 168 hours.  Iron Studies: No results for input(s): IRON, TIBC, TRANSFERRIN, FERRITIN in the last 72 hours. Studies/Results: CT Head Wo Contrast  Result Date: 11/09/2020 CLINICAL DATA:  Unsteady 8, bilateral leg weakness for 3 weeks, posterior headaches EXAM: CT HEAD WITHOUT CONTRAST TECHNIQUE: Contiguous axial images were  obtained from the base of the skull through the vertex without intravenous contrast. COMPARISON:  08/30/2020 FINDINGS: Brain: Stable confluent hypodensities throughout the periventricular white matter compatible with chronic small vessel ischemic change. Chronic lacunar infarct left basal ganglia. No signs of acute infarct or hemorrhage. Lateral ventricles and remaining midline structures are unremarkable. No acute extra-axial fluid collections. No mass effect. Vascular: Stable diffuse atherosclerosis.  No hyperdense vessel. Skull: Normal. Negative for fracture or focal lesion. Sinuses/Orbits: No acute finding. Other: None. IMPRESSION: 1. Stable chronic small vessel ischemic changes. No acute intracranial process. Electronically Signed   By: Randa Ngo M.D.   On: 11/09/2020 17:18   MR BRAIN WO CONTRAST  Result Date: 11/10/2020 CLINICAL DATA:  Neuro deficit, acute, stroke suspected. Additional history provided: Difficulty walking secondary to jerking, history of TIA. EXAM: MRI HEAD WITHOUT CONTRAST TECHNIQUE: Multiplanar, multiecho pulse sequences of the brain and surrounding structures were obtained without intravenous contrast. COMPARISON:  Prior head CT examinations 11/09/2020 and earlier. Report from brain MRI 02/06/2019 (images currently unavailable). FINDINGS: Brain: Mild-to-moderate generalized parenchymal atrophy. Chronic lacunar infarcts within the bilateral basal ganglia.  Background severe patchy and confluent T2/FLAIR hyperintensity within the cerebral white matter, nonspecific but compatible with chronic small vessel ischemic disease. To a lesser degree, chronic small vessel ischemic changes are also present within the pons. There are two chronic lacunar infarcts within the left cerebellar hemisphere. There are a few scattered nonspecific supratentorial and infratentorial chronic microhemorrhages. There is no acute infarct. No evidence of intracranial mass. No extra-axial fluid collection. No  midline shift. Vascular: Expected proximal arterial flow voids. Skull and upper cervical spine: No focal marrow lesion. Incompletely assessed cervical spondylosis. Sinuses/Orbits: Visualized orbits show no acute finding. Trace bilateral ethmoid sinus mucosal thickening. Small volume secretions layering within the left sphenoid sinus. IMPRESSION: No evidence of acute intracranial abnormality. Chronic lacunar infarcts within the bilateral basal ganglia and left cerebellar hemisphere. Background severe cerebral white matter chronic small vessel ischemic disease. Mild chronic small vessel ischemic changes are also present within the pons. Mild-to-moderate generalized parenchymal atrophy. Left sphenoid sinusitis. Electronically Signed   By: Kellie Simmering DO   On: 11/10/2020 16:24   DG CHEST PORT 1 VIEW  Result Date: 11/09/2020 CLINICAL DATA:  Increased shortness of breath EXAM: PORTABLE CHEST 1 VIEW COMPARISON:  11/09/2020, 10/05/2020 CT 02/26/2018 FINDINGS: Post sternotomy changes. Cardiomegaly with vascular congestion and increased pulmonary edema. Small bilateral pleural effusions which appears slightly increased. Atelectasis versus mild infiltrate left lung base. No pneumothorax. Vascular stent in the left upper arm IMPRESSION: 1. Cardiomegaly with increased small pleural effusions, worsened vascular congestion and development of interstitial pulmonary edema. 2. Atelectasis versus developing infiltrate at the left base. Electronically Signed   By: Donavan Foil M.D.   On: 11/09/2020 22:47   DG Chest Portable 1 View  Result Date: 11/09/2020 CLINICAL DATA:  Cough for several days EXAM: PORTABLE CHEST 1 VIEW COMPARISON:  10/05/2020 FINDINGS: Cardiac shadow is enlarged but stable. Postsurgical changes are noted. Scarring is noted in the left base and left mid lung stable from the prior exam. Right basilar scarring is noted as well. Mild vascular congestion is noted but improved from the prior study. No significant  edema is seen. Vascular stenting is noted in the left arm. IMPRESSION: Stable scarring bilaterally. Mild vascular congestion likely related to a degree of volume overload. Electronically Signed   By: Inez Catalina M.D.   On: 11/09/2020 16:22   . amiodarone  200 mg Oral Daily  . aspirin EC  81 mg Oral Daily  . Chlorhexidine Gluconate Cloth  6 each Topical Q0600  . [START ON 11/16/2020] darbepoetin (ARANESP) injection - DIALYSIS  60 mcg Intravenous Q Mon-HD  . heparin  5,000 Units Subcutaneous Q8H  . isosorbide mononitrate  60 mg Oral Daily  . multivitamin  1 tablet Oral Daily  . pantoprazole  40 mg Oral Daily  . sevelamer carbonate  1,600 mg Oral TID WC  . simvastatin  20 mg Oral QHS    BMET    Component Value Date/Time   NA 135 11/11/2020 0618   K 3.7 11/11/2020 0618   CL 97 (L) 11/11/2020 0618   CO2 29 11/11/2020 0618   GLUCOSE 98 11/11/2020 0618   BUN 27 (H) 11/11/2020 0618   CREATININE 6.64 (H) 11/11/2020 0618   CREATININE 6.57 (H) 03/05/2019 1159   CALCIUM 10.2 11/11/2020 0618   CALCIUM 8.1 (L) 05/29/2013 1814   GFRNONAA 6 (L) 11/11/2020 0618   GFRNONAA 6 (L) 03/05/2019 1159   GFRAA 9 (L) 02/12/2020 1712   GFRAA 6 (L) 03/05/2019 1159   CBC  Component Value Date/Time   WBC 5.2 11/11/2020 0618   RBC 2.26 (L) 11/11/2020 0618   HGB 8.3 (L) 11/11/2020 0618   HCT 27.3 (L) 11/11/2020 0618   PLT 220 11/11/2020 0618   MCV 120.8 (H) 11/11/2020 0618   MCH 36.7 (H) 11/11/2020 0618   MCHC 30.4 11/11/2020 0618   RDW 19.3 (H) 11/11/2020 0618   LYMPHSABS 1.0 11/09/2020 1513   MONOABS 0.6 11/09/2020 1513   EOSABS 0.4 11/09/2020 1513   BASOSABS 0.1 11/09/2020 1513    Dialysis Orders: [facility] EDW61kgHD Bath 2K/2.5CaTime 3.5 hrsHeparin none. AccessLUE AVFBFR 300DFR 600  Hectoral 2.36mcg IV/HD Epogen 8000Units IV/HD   Assessment/Plan: 1.  Hypertensive urgency with flash pulmonary edema - Markedly improved with UF on HD and meds. 2. Myoclonus  - has been a recurring issue for her.  Recommend stopping gabapentin as this can accumulate in patients with ESRD. 1. Do not resume gabapentin as likely source of myoclonus and leg weakness. 3.  ESRD -  plan for outpatient HD tomorrow and again on Friday. 4.  Hypertension/volume  -  Improved with HD and meds.  Only able to UF 645 due to drops in BP but no pulmonary edema or peripheral edema today. 5.  Anemia  - will dose with aranesp and follow 6.  Metabolic bone disease -  meds on hold due to bipap 7.  Nutrition - npo for now 8. Acute hypoxic respiratory arrest - resolved.  9. DM type 2 - per primary 10. Vascular access - have had issues with avf in past but functioned well 11/10/20 11. Disposition - stable for discharge to home today and willhave extra HD tx at her home unit in [place] tomorrow and get back on schedule Friday.    Donetta Potts, MD [employer] [phone]

## 2020-11-11 NOTE — Plan of Care (Signed)
  Problem: Education: Goal: Knowledge of General Education information will improve Description: Including pain rating scale, medication(s)/side effects and non-pharmacologic comfort measures Outcome: Adequate for Discharge   Problem: Health Behavior/Discharge Planning: Goal: Ability to manage health-related needs will improve Outcome: Adequate for Discharge   Problem: Clinical Measurements: Goal: Ability to maintain clinical measurements within normal limits will improve Outcome: Adequate for Discharge Goal: Will remain free from infection Outcome: Adequate for Discharge Goal: Diagnostic test results will improve Outcome: Adequate for Discharge Goal: Respiratory complications will improve Outcome: Adequate for Discharge Goal: Cardiovascular complication will be avoided Outcome: Adequate for Discharge   Problem: Activity: Goal: Risk for activity intolerance will decrease Outcome: Adequate for Discharge   Problem: Nutrition: Goal: Adequate nutrition will be maintained Outcome: Adequate for Discharge

## 2020-11-11 NOTE — Evaluation (Signed)
Physical Therapy Evaluation Patient Details Name: Shawna [name] MRN: [id number] DOB: 04/27/1941 Today's Date: 11/11/2020   History of Present Illness  Shawna [name]  is a 80 y.o. female, with history of TIA, peptic ulcer disease, paroxysmal atrial fibrillation, mitral regurg, hypertension, hypercholesterol, GERD, ESRD on hemodialysis, chronic diastolic CHF, and more presents the ED with a chief complaint of difficulty walking secondary to jerking.  Patient reports that she has been having muscle spasms for the last 2 months.  Her doctor started her on a medication that she cannot recall the name of, and it has not helped.  And then in the last 24 hours she started having her knees buckle when she is walking.  She reports that it feels like she is having spasms in her knees and legs as well as her arms and hands bilaterally.  It is better but not resolved at rest, and it was worse with active motion.  She reports weakness in her legs secondary to this jerking.  The weakness is also bilateral started over the last 3 days, but worsening in the last 24 hours.  Patient admits to pain in her lower back that is chronic and is worse with standing for long periods of time.  She reports that she has pain medication to take at home for that.  She also reports intermittent chest pain that is been going on so long as she can remember, for which she takes nitro glycerin every other day.  Patient reports a dry cough that is worse at night.  She reports dyspnea was worse on exertion and has been present chronically.  Patient is an ESRD patient with no urine output at all.  She missed dialysis today, and her last dialysis was 4/29.  Patient denies any nausea vomiting diarrhea or skin rashes.  She does report chronic pruritus.  She has a PCP appointment coming up for this problem.  Patient has no other complaints at the time of admission.    Clinical Impression  Patient unable to maintain standing balance without AD, has to lean  on armrest of chair for support, safer using RW and demonstrates slow labored cadence with frequent veering to the right during ambulation, had to take a sitting rest break before returning to room.  Patient tolerated sitting up in chair after therapy and states her spouse will be able to take care of her when she returns home.  Plan:  Patient to be discharged home today and discharged from physical therapy to care of nursing for ambulation daily as tolerated for length of stay.     Follow Up Recommendations Supervision for mobility/OOB;Supervision - Intermittent;SNF    Equipment Recommendations  None recommended by PT    Recommendations for Other Services       Precautions / Restrictions Precautions Precautions: Fall Restrictions Weight Bearing Restrictions: No      Mobility  Bed Mobility Overal bed mobility: Needs Assistance Bed Mobility: Supine to Sit     Supine to sit: Supervision     General bed mobility comments: increased time, labored movement    Transfers Overall transfer level: Needs assistance   Transfers: Sit to/from Stand;Stand Pivot Transfers Sit to Stand: Min assist Stand pivot transfers: Min assist       General transfer comment: unsteady labored movement, required use of RW for safety  Ambulation/Gait Ambulation/Gait assistance: Min assist Gait Distance (Feet): 45 Feet Assistive device: Rolling walker (2 wheeled) Gait Pattern/deviations: Decreased step length - right;Decreased step length - left;Decreased stride  length;Drifts right/left Gait velocity: decreased   General Gait Details: slow labored cadence with frequent veering to the right requiring verbal/tactile assistance to avoid hitting nearby objects, limited secondary to fatigue, had to take a sitting rest break before returning to room  Stairs            Wheelchair Mobility    Modified Rankin (Stroke Patients Only)       Balance Overall balance assessment: Needs  assistance Sitting-balance support: Feet supported;No upper extremity supported Sitting balance-Leahy Scale: Good Sitting balance - Comments: seated at EOB   Standing balance support: During functional activity;No upper extremity supported Standing balance-Leahy Scale: Poor Standing balance comment: fair using RW                             Pertinent Vitals/Pain Pain Assessment: No/denies pain    Home Living Family/patient expects to be discharged to:: Private residence Living Arrangements: Spouse/significant other Available Help at Discharge: Family;Available PRN/intermittently Type of Home: House Home Access: Stairs to enter Entrance Stairs-Rails: Right Entrance Stairs-Number of Steps: 1 Home Layout: One level Home Equipment: Cane - single point;Walker - 4 wheels      Prior Function Level of Independence: Independent with assistive device(s)         Comments: household ambulator without AD, uses SPC for longer distances     Hand Dominance   Dominant Hand: Right    Extremity/Trunk Assessment   Upper Extremity Assessment Upper Extremity Assessment: Defer to OT evaluation    Lower Extremity Assessment Lower Extremity Assessment: Generalized weakness    Cervical / Trunk Assessment Cervical / Trunk Assessment: Normal  Communication   Communication: No difficulties  Cognition Arousal/Alertness: Awake/alert Behavior During Therapy: WFL for tasks assessed/performed Overall Cognitive Status: Within Functional Limits for tasks assessed                                        General Comments      Exercises     Assessment/Plan    PT Assessment All further PT needs can be met in the next venue of care  PT Problem List Decreased strength;Decreased balance;Decreased activity tolerance;Decreased mobility       PT Treatment Interventions      PT Goals (Current goals can be found in the Care Plan section)  Acute Rehab PT  Goals Patient Stated Goal: return home with family to assist PT Goal Formulation: With patient Time For Goal Achievement: 11/11/20 Potential to Achieve Goals: Good    Frequency     Barriers to discharge        Co-evaluation               AM-PAC PT "6 Clicks" Mobility  Outcome Measure Help needed turning from your back to your side while in a flat bed without using bedrails?: None Help needed moving from lying on your back to sitting on the side of a flat bed without using bedrails?: A Little Help needed moving to and from a bed to a chair (including a wheelchair)?: A Little Help needed standing up from a chair using your arms (e.g., wheelchair or bedside chair)?: A Little Help needed to walk in hospital room?: A Little Help needed climbing 3-5 steps with a railing? : A Lot 6 Click Score: 18    End of Session   Activity Tolerance: Patient tolerated  treatment well;Patient limited by fatigue Patient left: in chair;with call bell/phone within reach Nurse Communication: Mobility status PT Visit Diagnosis: Unsteadiness on feet (R26.81);Muscle weakness (generalized) (M62.81);Other abnormalities of gait and mobility (R26.89)    Time: 9688-6484 PT Time Calculation (min) (ACUTE ONLY): 21 min   Charges:   PT Evaluation $PT Eval Moderate Complexity: 1 Mod PT Treatments $Therapeutic Activity: 8-22 mins        12:09 PM, 11/11/20 Lonell Grandchild, MPT Physical Therapist with [facility] [phone] office [phone] mobile phone

## 2020-11-11 NOTE — TOC Transition Note (Signed)
Transition of Care [facility]) - CM/SW Discharge Note   Patient Details  Name: Shawna [name] MRN: [id number] Date of Birth: [birth year]  [facility]) CM/SW Contact:  Boneta Lucks, RN Phone Number: 11/11/2020, 11:39 AM   Clinical Narrative:   Patient discharging home. MD ordering HHPT. [place] accepted the referral.  Faxed orders to [facility] at [phone]. Added to AVS. Updated patient, home health will come on Tuesday and Thursday opposite days of her dialysis. Planning for first home visit 5/10.  Final next level of care: Home/Self Care Barriers to Discharge: Barriers Resolved   Patient Goals and CMS Choice Patient states their goals for this hospitalization and ongoing recovery are:: to go home. CMS Medicare.gov Compare Post Acute Care list provided to:: Patient Choice offered to / list presented to : Patient  Discharge Placement        Patient to be transferred to facility by: Husband   Patient and family notified of of transfer: 11/11/20  Discharge Plan and Services      DME Agency: [facility] [place], [place]) Date DME Agency Contacted: 11/11/20 Time DME Agency Contacted: [phone] Representative spoke with at DME Agency: [place] Arranged: PT      Readmission Risk Interventions Readmission Risk Prevention Plan 11/11/2020 10/04/2020 09/02/2020  Transportation Screening Complete Complete Complete  PCP or Specialist Appt within 3-5 Days - - -  [place] or [place] for [place] - - -  Medication Review Press photographer) Complete Complete Complete  PCP or Specialist appointment within 3-5 days of discharge Complete Complete -  [place] or [place] Complete Complete Complete  SW Recovery Care/Counseling Consult Complete Complete Complete  Palliative Care Screening Not Applicable Not Applicable Not [name] Not Applicable Complete Patient Refused   Some recent data might be hidden

## 2020-11-11 NOTE — Progress Notes (Signed)
Patient discharged home with husband. Discharge instructions reviewed. Patient to have dialysis in [place] tomorrow at 1145, and to resume schedule again on Friday. Patient aware and verbalizes understanding. All questions answered. All belongings accounted for. Assisted via w/c to personal vehicle.

## 2020-12-01 ENCOUNTER — Other Ambulatory Visit: Payer: Self-pay

## 2020-12-01 ENCOUNTER — Encounter (INDEPENDENT_AMBULATORY_CARE_PROVIDER_SITE_OTHER): Payer: Self-pay | Admitting: Internal Medicine

## 2020-12-01 ENCOUNTER — Ambulatory Visit (INDEPENDENT_AMBULATORY_CARE_PROVIDER_SITE_OTHER): Payer: Medicare HMO | Admitting: Internal Medicine

## 2020-12-01 VITALS — BP 189/60 | HR 73 | Temp 98.9°F | Ht 61.0 in | Wt 132.8 lb

## 2020-12-01 DIAGNOSIS — K219 Gastro-esophageal reflux disease without esophagitis: Secondary | ICD-10-CM | POA: Diagnosis not present

## 2020-12-01 DIAGNOSIS — D649 Anemia, unspecified: Secondary | ICD-10-CM

## 2020-12-01 MED ORDER — LOPERAMIDE HCL 2 MG PO TABS
2.0000 mg | ORAL_TABLET | Freq: Every day | ORAL | 0 refills | Status: DC
Start: 2020-12-01 — End: 2020-12-17

## 2020-12-01 MED ORDER — OMEPRAZOLE 20 MG PO CPDR: 20.0000 mg | DELAYED_RELEASE_CAPSULE | Freq: Every day | ORAL | 3 refills | Status: DC

## 2020-12-01 NOTE — Progress Notes (Signed)
Presenting complaint;  Follow-up for GERD and anemia. Patient complains of diarrhea.  Database and subjective:  Patient is 80 year old Afro-American female who is here for scheduled visit accompanied by her husband [name].  She has complicated GI history.  She has a history of GI bleed, diarrhea GERD as well as history of abdominal pain felt to be secondary to ischemic injury small bowel few years ago and she responded to medical therapy.  She also has a history of colon carcinoma with right hemicolectomy in 1992.  Her last colonoscopy was in August 2020 for rectal bleeding when she was found to have diverticulosis and large adenoma was removed piecemeal.  Her last EGD was in March 2021 with removal of small duodenal tubular adenoma.  Patient complains of weakness in both legs.  She denies paresthesias or numbness.  She was also having she is using cane to move around.  Her husband is really worried about her weakness and concerned that she may fall down.  She has seen Dr. Merlene Laughter in the past but not recently. She says she gets her hemoglobin checked every 2 weeks at the time of dialysis.  Her hemoglobin has remained low but stable.  Her hemoglobin was 8.3 on 11/11/2020. She says heartburn is well controlled with therapy.  She is willing to drop the dose to 20 mg daily.  She denies dysphagia nausea or vomiting.  Her appetite is is fair.  She has lost 7 pounds since her last visit of January 2022.  She continues to complain of diarrhea.  She has anywhere from 2-4 stools per day.  Stools are semiformed to formed.  She denies melena or rectal bleeding.  She has occasional abdominal cramping.   Current Medications: Outpatient Encounter Medications as of 12/01/2020  Medication Sig  . amiodarone (PACERONE) 200 MG tablet Take 1 tablet (200 mg total) by mouth daily. Starting July 2  . aspirin EC 81 MG tablet Take 81 mg by mouth daily.   . fluticasone (FLONASE) 50 MCG/ACT nasal spray Place 1 spray into  both nostrils at bedtime as needed for allergies.   . isosorbide mononitrate (IMDUR) 60 MG 24 hr tablet Take 1 tablet (60 mg total) by mouth in the morning and at bedtime.  . lidocaine-prilocaine (EMLA) cream Apply 1 application topically every Monday, Wednesday, and Friday. Prior to dialysis  . loratadine (CLARITIN) 10 MG tablet Take 1 tablet (10 mg total) by mouth daily as needed for allergies.  . multivitamin (RENA-VIT) TABS tablet Take 1 tablet by mouth daily.  . nitroGLYCERIN (NITROSTAT) 0.4 MG SL tablet Place 1 tablet (0.4 mg total) under the tongue every 5 (five) minutes x 3 doses as needed for chest pain (if no relief after 2nd dose, proceed to the ED for an evaluation or call 911).  Marland Kitchen omeprazole (PRILOSEC) 40 MG capsule Take 1 capsule (40 mg total) by mouth daily before breakfast.  . sevelamer carbonate (RENVELA) 800 MG tablet Take 2 tablets (1,600 mg total) by mouth 3 (three) times daily with meals.  . simvastatin (ZOCOR) 20 MG tablet Take 1 tablet (20 mg total) by mouth at bedtime.   No facility-administered encounter medications on file as of 12/01/2020.     Objective: Blood pressure (!) 189/60, pulse 73, temperature 98.9 F (37.2 C), temperature source Oral, height _0  (1.549 m), weight 132 lb 12.8 oz (60.2 kg). Patient is alert and in no acute distress. She is wearing a mask. Conjunctiva is pink. Sclera is nonicteric Oropharyngeal mucosa is normal.  No neck masses or thyromegaly noted. Cardiac exam with regular rhythm normal S1 and prominent S2.  She has soft murmur at aortic area. Lungs are clear to auscultation. Abdomen is symmetrical.  Bowel sounds are normal.  On palpation abdomen is soft and nontender with organomegaly or masses. She has trace edema around ankles right slightly more pronounced on the left.  Labs/studies Results:  CBC Latest Ref Rng & Units 11/11/2020 11/10/2020 11/09/2020  WBC 4.0 - 10.5 K/uL 5.2 6.3 6.6  Hemoglobin 12.0 - 15.0 g/dL 8.3(L) 8.2(L) 8.9(L)   Hematocrit 36.0 - 46.0 % 27.3(L) 26.6(L) 30.5(L)  Platelets 150 - 400 K/uL 220 258 261    CMP Latest Ref Rng & Units 11/11/2020 11/10/2020 11/09/2020  Glucose 70 - 99 mg/dL 98 86 88  BUN 8 - 23 mg/dL 27(H) 53(H) 43(H)  Creatinine 0.44 - 1.00 mg/dL 6.64(H) 12.01(H) 9.90(H)  Sodium 135 - 145 mmol/L 135 139 139  Potassium 3.5 - 5.1 mmol/L 3.7 4.4 4.7  Chloride 98 - 111 mmol/L 97(L) 101 99  CO2 22 - 32 mmol/L _0 Calcium 8.9 - 10.3 mg/dL 10.2 10.1 10.4(H)  Total Protein 6.5 - 8.1 g/dL - - 7.0  Total Bilirubin 0.3 - 1.2 mg/dL - - 0.6  Alkaline Phos 38 - 126 U/L - - 80  AST 15 - 41 U/L - - 8(L)  ALT 0 - 44 U/L - - 13    Hepatic Function Latest Ref Rng & Units 11/10/2020 11/09/2020 10/07/2020  Total Protein 6.5 - 8.1 g/dL - 7.0 -  Albumin 3.5 - 5.0 g/dL 3.1(L) 3.6 3.0(L)  AST 15 - 41 U/L - 8(L) -  ALT 0 - 44 U/L - 13 -  Alk Phosphatase 38 - 126 U/L - 80 -  Total Bilirubin 0.3 - 1.2 mg/dL - 0.6 -  Bilirubin, Direct 0.0 - 0.2 mg/dL - - -    Lab Results  Component Value Date   CRP <0.5 09/29/2020      Assessment:  #1.  Chronic GERD.  Symptoms well controlled with 40 mg of omeprazole.  I believe dose could be reduced to 20 mg daily.  #2.  Chronic diarrhea/increased stool frequency.  This problem appears to be chronic.  She is not a candidate for antispasmodic therapy.  We will try her on 1 to 2 mg of loperamide OTC daily and see how she does.  #3.  Weakness.  I doubt the weakness is due to low hemoglobin which she has had for a while.  I am concerned that she may have neuromuscular disorder to explain her symptoms.  She would benefit from seeing Dr. Simone Curia again.  #4.  Anemia.  Her anemia is multifactorial.  She has an element of chronic disease anemia as well as history of GI bleed.  No gross bleeding noted recently.   Plan:  Decrease omeprazole to 20 mg p.o. every morning. Loperamide OTC 1 to 2 mg daily or every other day. Hemoccult x1. Patient will call if she has melena or  rectal bleeding. Patient advised to keep track of stool frequency and consistency and call with progress report in 4 weeks. Office visit in 3 months.

## 2020-12-01 NOTE — Patient Instructions (Addendum)
Take Imodium OTC/loperamide 1 to 2 mg daily or every other day Please call office with progress report in 4 weeks regarding frequency of bowel movements Notify if you experience heartburn with omeprazole 20 mg daily.  Hemoccult x1.

## 2020-12-02 ENCOUNTER — Emergency Department (HOSPITAL_COMMUNITY): Payer: Medicare HMO

## 2020-12-02 ENCOUNTER — Other Ambulatory Visit: Payer: Self-pay

## 2020-12-02 ENCOUNTER — Encounter (HOSPITAL_COMMUNITY): Payer: Self-pay

## 2020-12-02 ENCOUNTER — Inpatient Hospital Stay (HOSPITAL_COMMUNITY)
Admission: EM | Admit: 2020-12-02 | Discharge: 2020-12-17 | DRG: 555 | Disposition: A | Discharge status: Skilled Nursing Facility | Payer: Medicare HMO | Attending: Internal Medicine | Admitting: Internal Medicine

## 2020-12-02 DIAGNOSIS — R270 Ataxia, unspecified: Secondary | ICD-10-CM

## 2020-12-02 DIAGNOSIS — M1990 Unspecified osteoarthritis, unspecified site: Secondary | ICD-10-CM | POA: Diagnosis present

## 2020-12-02 DIAGNOSIS — Z800 Family history of malignant neoplasm of digestive organs: Secondary | ICD-10-CM

## 2020-12-02 DIAGNOSIS — E782 Mixed hyperlipidemia: Secondary | ICD-10-CM | POA: Diagnosis not present

## 2020-12-02 DIAGNOSIS — K219 Gastro-esophageal reflux disease without esophagitis: Secondary | ICD-10-CM | POA: Diagnosis present

## 2020-12-02 DIAGNOSIS — Z79899 Other long term (current) drug therapy: Secondary | ICD-10-CM

## 2020-12-02 DIAGNOSIS — G9341 Metabolic encephalopathy: Secondary | ICD-10-CM | POA: Diagnosis not present

## 2020-12-02 DIAGNOSIS — I447 Left bundle-branch block, unspecified: Secondary | ICD-10-CM | POA: Diagnosis present

## 2020-12-02 DIAGNOSIS — E538 Deficiency of other specified B group vitamins: Secondary | ICD-10-CM | POA: Diagnosis present

## 2020-12-02 DIAGNOSIS — Z888 Allergy status to other drugs, medicaments and biological substances status: Secondary | ICD-10-CM

## 2020-12-02 DIAGNOSIS — R55 Syncope and collapse: Secondary | ICD-10-CM | POA: Diagnosis not present

## 2020-12-02 DIAGNOSIS — I160 Hypertensive urgency: Secondary | ICD-10-CM | POA: Diagnosis present

## 2020-12-02 DIAGNOSIS — R29898 Other symptoms and signs involving the musculoskeletal system: Secondary | ICD-10-CM | POA: Diagnosis not present

## 2020-12-02 DIAGNOSIS — M609 Myositis, unspecified: Secondary | ICD-10-CM | POA: Diagnosis not present

## 2020-12-02 DIAGNOSIS — I953 Hypotension of hemodialysis: Secondary | ICD-10-CM | POA: Diagnosis not present

## 2020-12-02 DIAGNOSIS — Z85038 Personal history of other malignant neoplasm of large intestine: Secondary | ICD-10-CM

## 2020-12-02 DIAGNOSIS — R296 Repeated falls: Secondary | ICD-10-CM | POA: Diagnosis present

## 2020-12-02 DIAGNOSIS — Z8349 Family history of other endocrine, nutritional and metabolic diseases: Secondary | ICD-10-CM

## 2020-12-02 DIAGNOSIS — E7800 Pure hypercholesterolemia, unspecified: Secondary | ICD-10-CM | POA: Diagnosis present

## 2020-12-02 DIAGNOSIS — F419 Anxiety disorder, unspecified: Secondary | ICD-10-CM | POA: Diagnosis present

## 2020-12-02 DIAGNOSIS — E785 Hyperlipidemia, unspecified: Secondary | ICD-10-CM | POA: Diagnosis present

## 2020-12-02 DIAGNOSIS — I252 Old myocardial infarction: Secondary | ICD-10-CM

## 2020-12-02 DIAGNOSIS — I5042 Chronic combined systolic (congestive) and diastolic (congestive) heart failure: Secondary | ICD-10-CM | POA: Diagnosis present

## 2020-12-02 DIAGNOSIS — Z992 Dependence on renal dialysis: Secondary | ICD-10-CM

## 2020-12-02 DIAGNOSIS — E1122 Type 2 diabetes mellitus with diabetic chronic kidney disease: Secondary | ICD-10-CM | POA: Diagnosis present

## 2020-12-02 DIAGNOSIS — D509 Iron deficiency anemia, unspecified: Secondary | ICD-10-CM | POA: Diagnosis present

## 2020-12-02 DIAGNOSIS — I10 Essential (primary) hypertension: Secondary | ICD-10-CM | POA: Diagnosis present

## 2020-12-02 DIAGNOSIS — Z20822 Contact with and (suspected) exposure to covid-19: Secondary | ICD-10-CM | POA: Diagnosis present

## 2020-12-02 DIAGNOSIS — I132 Hypertensive heart and chronic kidney disease with heart failure and with stage 5 chronic kidney disease, or end stage renal disease: Secondary | ICD-10-CM | POA: Diagnosis present

## 2020-12-02 DIAGNOSIS — R059 Cough, unspecified: Secondary | ICD-10-CM | POA: Diagnosis not present

## 2020-12-02 DIAGNOSIS — M25571 Pain in right ankle and joints of right foot: Secondary | ICD-10-CM | POA: Diagnosis not present

## 2020-12-02 DIAGNOSIS — I5032 Chronic diastolic (congestive) heart failure: Secondary | ICD-10-CM | POA: Diagnosis not present

## 2020-12-02 DIAGNOSIS — G729 Myopathy, unspecified: Secondary | ICD-10-CM | POA: Diagnosis not present

## 2020-12-02 DIAGNOSIS — G8929 Other chronic pain: Secondary | ICD-10-CM | POA: Diagnosis not present

## 2020-12-02 DIAGNOSIS — M109 Gout, unspecified: Secondary | ICD-10-CM | POA: Diagnosis present

## 2020-12-02 DIAGNOSIS — R946 Abnormal results of thyroid function studies: Secondary | ICD-10-CM | POA: Diagnosis present

## 2020-12-02 DIAGNOSIS — I480 Paroxysmal atrial fibrillation: Secondary | ICD-10-CM | POA: Diagnosis not present

## 2020-12-02 DIAGNOSIS — Z882 Allergy status to sulfonamides status: Secondary | ICD-10-CM

## 2020-12-02 DIAGNOSIS — Z881 Allergy status to other antibiotic agents status: Secondary | ICD-10-CM

## 2020-12-02 DIAGNOSIS — R0602 Shortness of breath: Secondary | ICD-10-CM

## 2020-12-02 DIAGNOSIS — Z951 Presence of aortocoronary bypass graft: Secondary | ICD-10-CM

## 2020-12-02 DIAGNOSIS — Z90710 Acquired absence of both cervix and uterus: Secondary | ICD-10-CM

## 2020-12-02 DIAGNOSIS — M25572 Pain in left ankle and joints of left foot: Secondary | ICD-10-CM | POA: Diagnosis not present

## 2020-12-02 DIAGNOSIS — Z880 Allergy status to penicillin: Secondary | ICD-10-CM

## 2020-12-02 DIAGNOSIS — I5022 Chronic systolic (congestive) heart failure: Secondary | ICD-10-CM | POA: Diagnosis present

## 2020-12-02 DIAGNOSIS — I2510 Atherosclerotic heart disease of native coronary artery without angina pectoris: Secondary | ICD-10-CM | POA: Diagnosis present

## 2020-12-02 DIAGNOSIS — Z8673 Personal history of transient ischemic attack (TIA), and cerebral infarction without residual deficits: Secondary | ICD-10-CM

## 2020-12-02 DIAGNOSIS — M6281 Muscle weakness (generalized): Secondary | ICD-10-CM | POA: Diagnosis present

## 2020-12-02 DIAGNOSIS — D631 Anemia in chronic kidney disease: Secondary | ICD-10-CM | POA: Diagnosis present

## 2020-12-02 DIAGNOSIS — Z885 Allergy status to narcotic agent status: Secondary | ICD-10-CM

## 2020-12-02 DIAGNOSIS — Z8616 Personal history of COVID-19: Secondary | ICD-10-CM | POA: Diagnosis not present

## 2020-12-02 DIAGNOSIS — Z7982 Long term (current) use of aspirin: Secondary | ICD-10-CM

## 2020-12-02 DIAGNOSIS — Z8701 Personal history of pneumonia (recurrent): Secondary | ICD-10-CM

## 2020-12-02 DIAGNOSIS — N186 End stage renal disease: Secondary | ICD-10-CM | POA: Diagnosis present

## 2020-12-02 DIAGNOSIS — E8889 Other specified metabolic disorders: Secondary | ICD-10-CM | POA: Diagnosis present

## 2020-12-02 DIAGNOSIS — Z8543 Personal history of malignant neoplasm of ovary: Secondary | ICD-10-CM

## 2020-12-02 DIAGNOSIS — Z91041 Radiographic dye allergy status: Secondary | ICD-10-CM

## 2020-12-02 DIAGNOSIS — Z8711 Personal history of peptic ulcer disease: Secondary | ICD-10-CM

## 2020-12-02 DIAGNOSIS — Z833 Family history of diabetes mellitus: Secondary | ICD-10-CM

## 2020-12-02 DIAGNOSIS — Z955 Presence of coronary angioplasty implant and graft: Secondary | ICD-10-CM

## 2020-12-02 DIAGNOSIS — Z8249 Family history of ischemic heart disease and other diseases of the circulatory system: Secondary | ICD-10-CM

## 2020-12-02 DIAGNOSIS — Z886 Allergy status to analgesic agent status: Secondary | ICD-10-CM

## 2020-12-02 DIAGNOSIS — M25579 Pain in unspecified ankle and joints of unspecified foot: Secondary | ICD-10-CM

## 2020-12-02 LAB — CBC WITH DIFFERENTIAL/PLATELET
Abs Immature Granulocytes: 0.03 10*3/uL (ref 0.00–0.07)
Basophils Absolute: 0.1 10*3/uL (ref 0.0–0.1)
Basophils Relative: 1 %
Eosinophils Absolute: 0.4 10*3/uL (ref 0.0–0.5)
Eosinophils Relative: 6 %
HCT: 35 % — ABNORMAL LOW (ref 36.0–46.0)
Hemoglobin: 10.6 g/dL — ABNORMAL LOW (ref 12.0–15.0)
Immature Granulocytes: 1 %
Lymphocytes Relative: 17 %
Lymphs Abs: 1 10*3/uL (ref 0.7–4.0)
MCH: 36.6 pg — ABNORMAL HIGH (ref 26.0–34.0)
MCHC: 30.3 g/dL (ref 30.0–36.0)
MCV: 120.7 fL — ABNORMAL HIGH (ref 80.0–100.0)
Monocytes Absolute: 0.5 10*3/uL (ref 0.1–1.0)
Monocytes Relative: 8 %
Neutro Abs: 4 10*3/uL (ref 1.7–7.7)
Neutrophils Relative %: 67 %
Platelets: 135 10*3/uL — ABNORMAL LOW (ref 150–400)
RBC: 2.9 MIL/uL — ABNORMAL LOW (ref 3.87–5.11)
RDW: 17.1 % — ABNORMAL HIGH (ref 11.5–15.5)
WBC: 6 10*3/uL (ref 4.0–10.5)
nRBC: 0 % (ref 0.0–0.2)

## 2020-12-02 IMAGING — MR MR HEAD W/O CM
6 of 11 series · 24 of 48 positions shown · non-contrast
Comparison: Prior CT from earlier the same day as well as recent
MRI from [DATE].

CLINICAL DATA: Initial evaluation for acute dizziness.

EXAM:
MRI HEAD WITHOUT CONTRAST
TECHNIQUE: Multiplanar, multiecho pulse sequences of the brain and surrounding
structures were obtained without intravenous contrast.

[Series 2: DWI · axial · 3.0mm · 0.94mm/px · z∈[-91,+52]mm · 8 of 100 slices shown (1 of 2)]
[im 1/100]
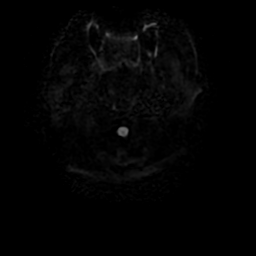
[im 15/100]
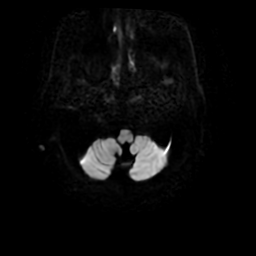
[im 29/100]
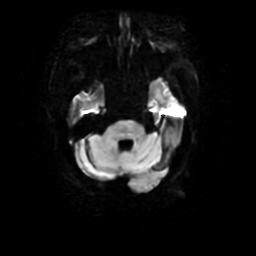
[im 43/100]
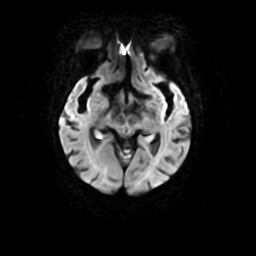
[im 57/100]
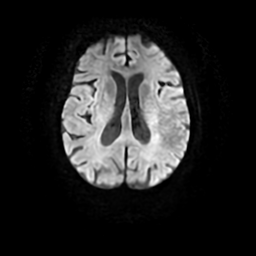
[im 71/100]
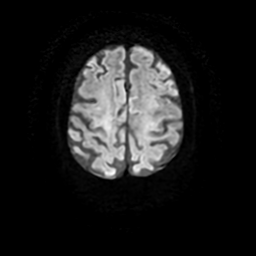
[im 85/100]
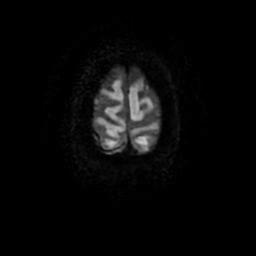
[im 100/100]
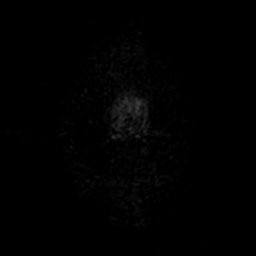

[Series 3: DWI · coronal · 4.0mm · 0.94mm/px · 5 of 68 slices shown (2 of 2)]
[im 1/68]
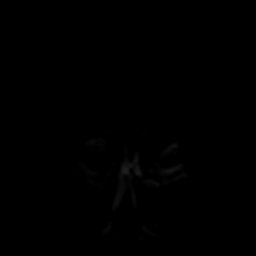
[im 17/68]
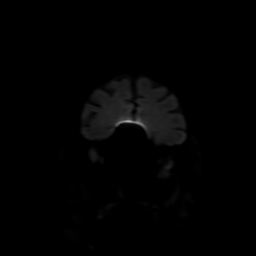
[im 34/68]
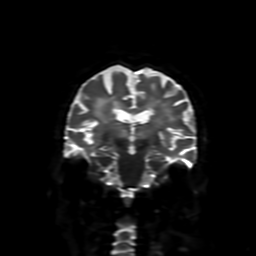
[im 51/68]
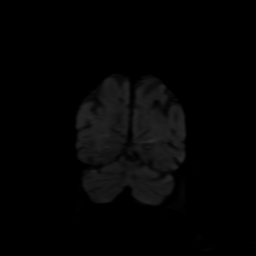
[im 68/68]
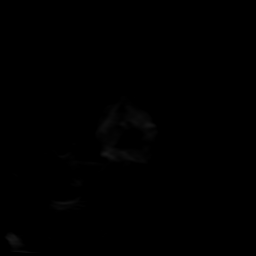

[Series 4: FLAIR · sagittal · 5.0mm · 0.23mm/px · 2 of 23 slices shown (1 of 2)]
[im 1/23]
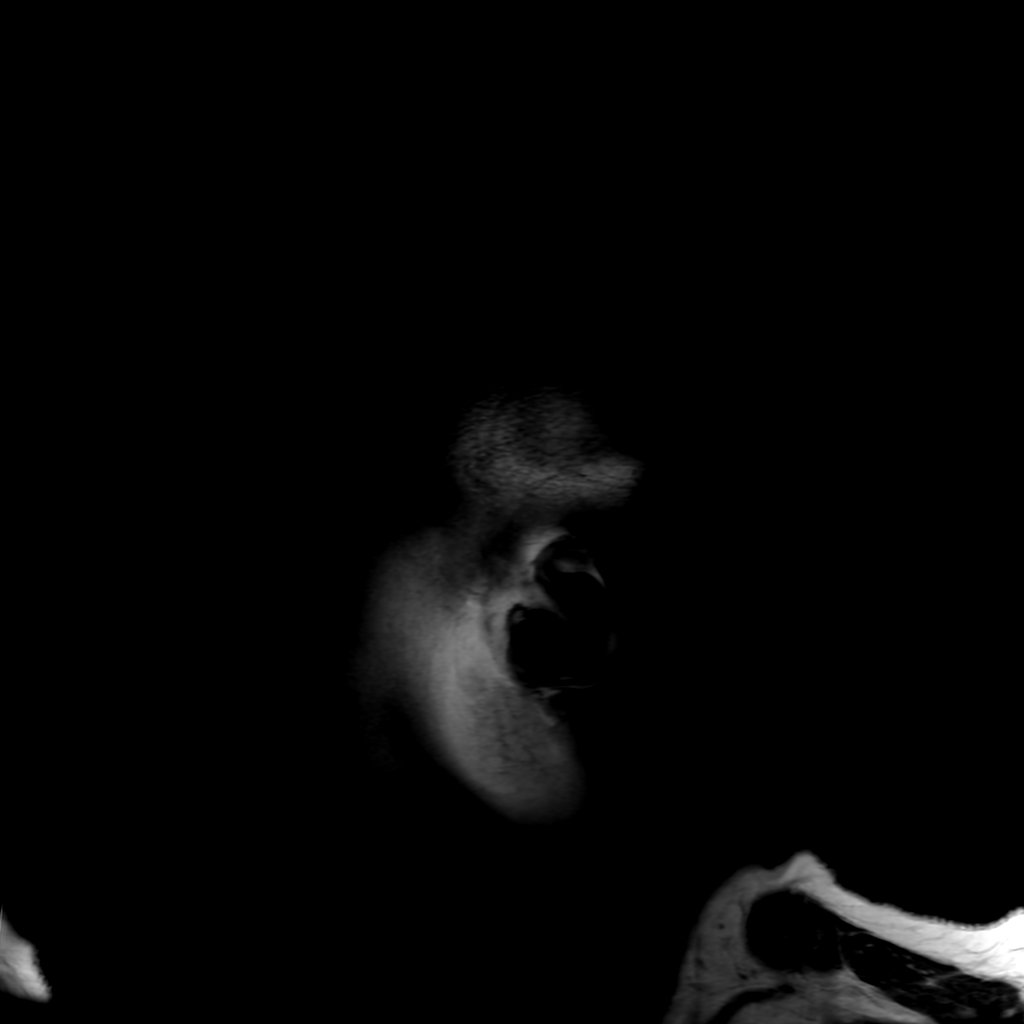
[im 23/23]
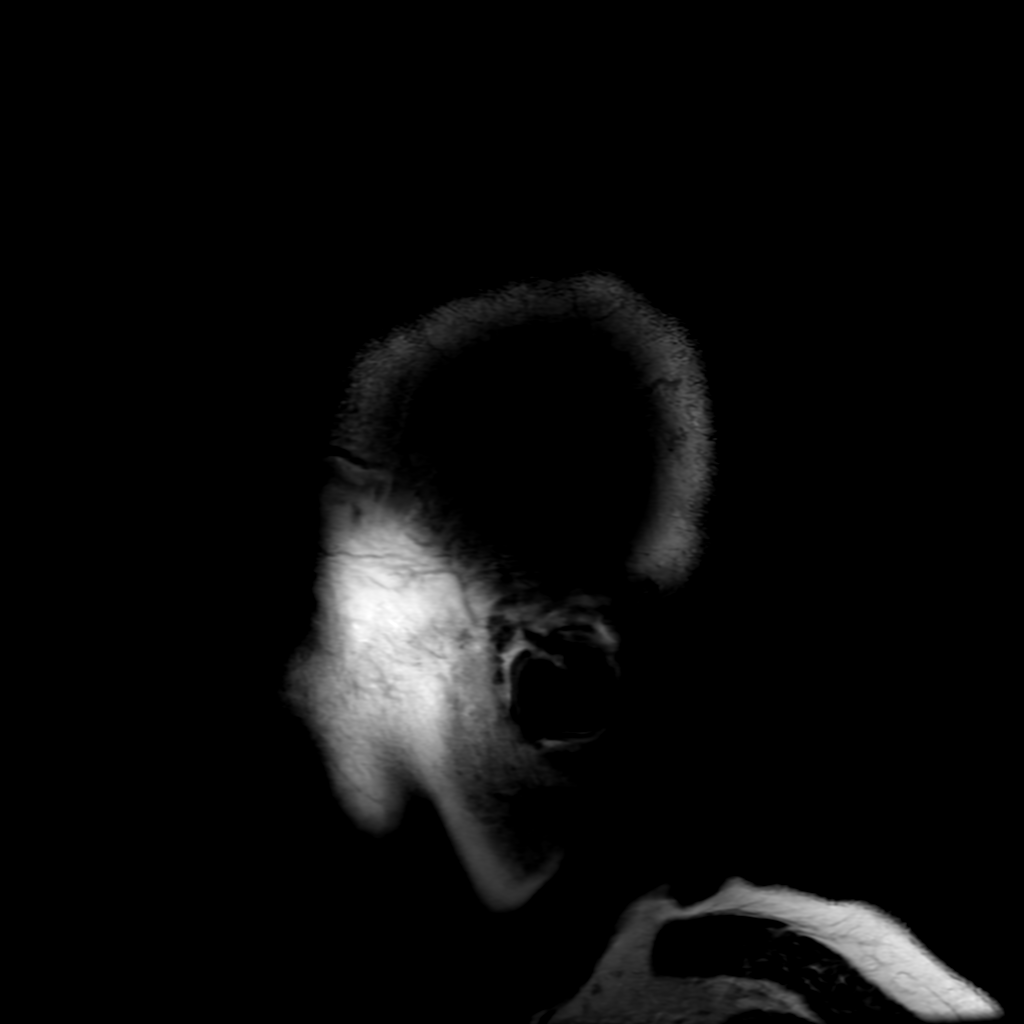

[Series 6: FLAIR · axial · 3.0mm · 0.45mm/px · z∈[-92,+54]mm · 2 of 26 slices shown (2 of 2)]
[im 1/26]
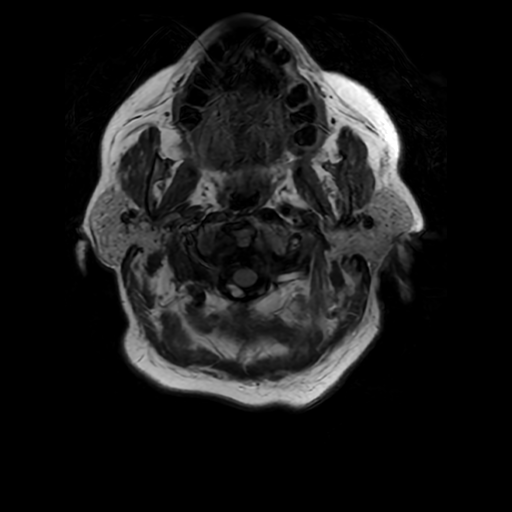
[im 26/26]
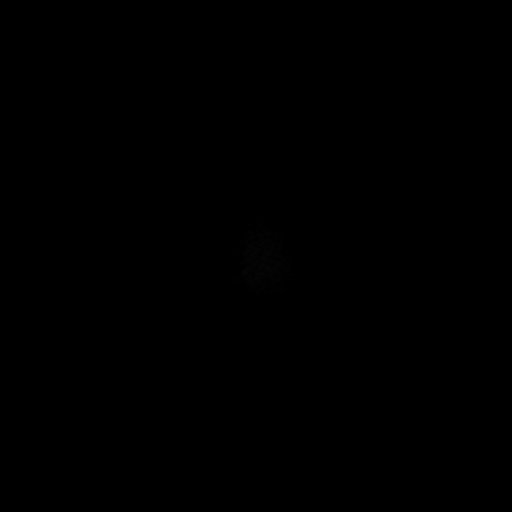

[Series 250: ADC · axial · 3.0mm · 0.94mm/px · z∈[-91,+52]mm · 4 of 50 slices shown (1 of 2)]
[im 1/50]
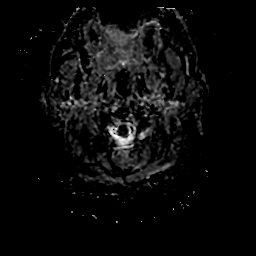
[im 17/50]
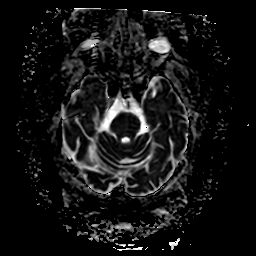
[im 33/50]
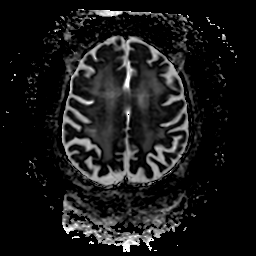
[im 50/50]
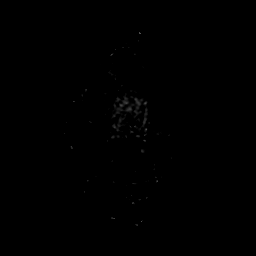

[Series 350: ADC · coronal · 4.0mm · 0.94mm/px · 3 of 32 slices shown (2 of 2)]
[im 1/32]
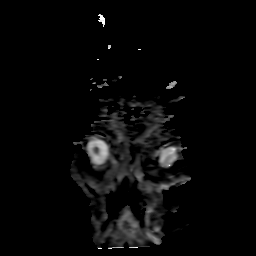
[im 16/32]
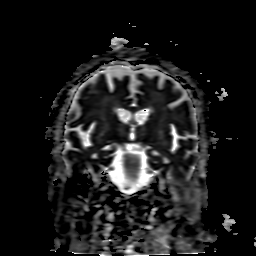
[im 32/32]
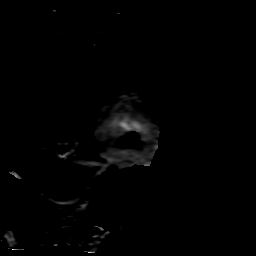

[24 of 48 positions shown; findings below may reference images not displayed]

FINDINGS: Brain: Examination degraded by motion artifact.

Generalized age-related cerebral atrophy. Fairly severe chronic
microvascular ischemic disease noted involving the periventricular
deep white matter both cerebral hemispheres, with more mild patchy
involvement of the pons. Remote lacunar infarct at the left basal
ganglia. Few additional small remote left cerebellar infarcts noted.

No convincing foci of restricted diffusion to suggest acute or
subacute ischemia. Gray-white matter differentiation maintained. No
encephalomalacia to suggest chronic cortical infarction. No acute
intracranial hemorrhage. Few scattered chronic micro hemorrhages
noted about the cerebellum and right cerebral hemisphere, likely
small vessel related.

No mass lesion, midline shift or mass effect. No hydrocephalus or
extra-axial fluid collection. Pituitary gland suprasellar region
normal. Midline structures intact.

Vascular: Major intracranial vascular flow voids are grossly
maintained at the skull base.

Skull and upper cervical spine: Craniocervical junction within
normal limits. Bone marrow signal intensity normal. No focal marrow
replacing lesion. No scalp soft tissue abnormality.

Sinuses/Orbits: Globes and orbital soft tissues demonstrate no acute
finding. Mild mucosal thickening noted within the left sphenoid
sinus. Paranasal sinuses are otherwise clear. No mastoid effusion.

Other: None.
IMPRESSION: 1. No acute intracranial abnormality.
2. Age-related cerebral atrophy with advanced chronic microvascular
ischemic disease, with a few scattered remote lacunar infarcts
involving the left basal ganglia and left cerebellum.

## 2020-12-02 IMAGING — CT CT HEAD W/O CM
4 series · 16 of 47 positions shown, 18 images · non-contrast
Comparison: Head CT dated [DATE].

CLINICAL DATA: 79-year-old female with head trauma.

EXAM:
CT HEAD WITHOUT CONTRAST
TECHNIQUE: Contiguous axial images were obtained from the base of the skull
through the vertex without intravenous contrast.

[Series 3: head wo · axial · 0.39mm/px · z∈[-130,-15]mm · 7 of 31 slices shown, 9 images]
[im 4/31  brain]
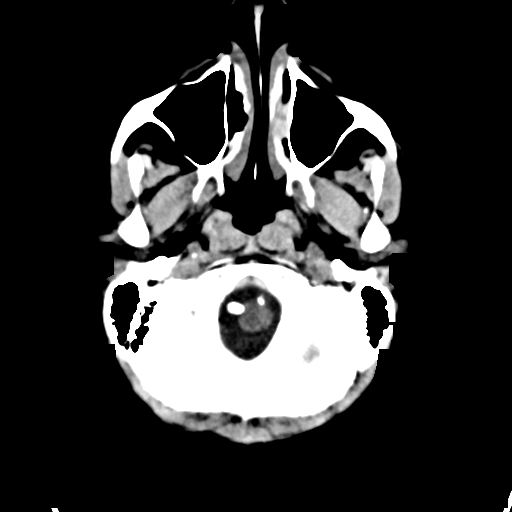
[im 4/31  bone]
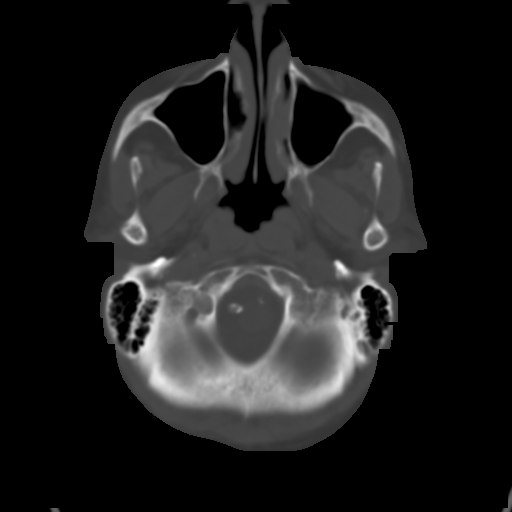
[im 8/31  brain]
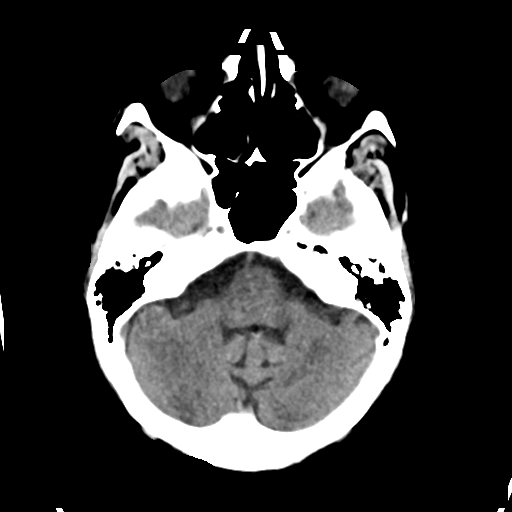
[im 12/31  brain]
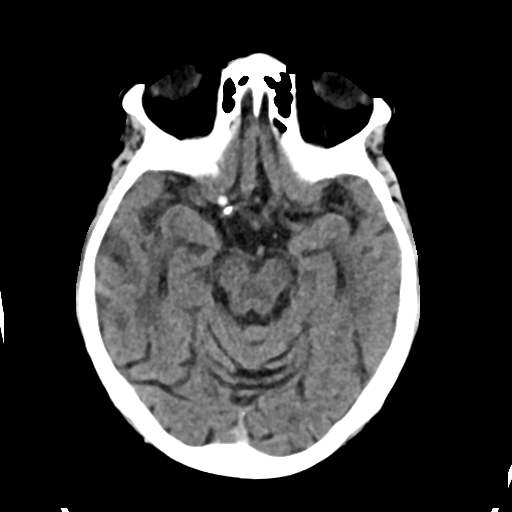
[im 16/31  brain]
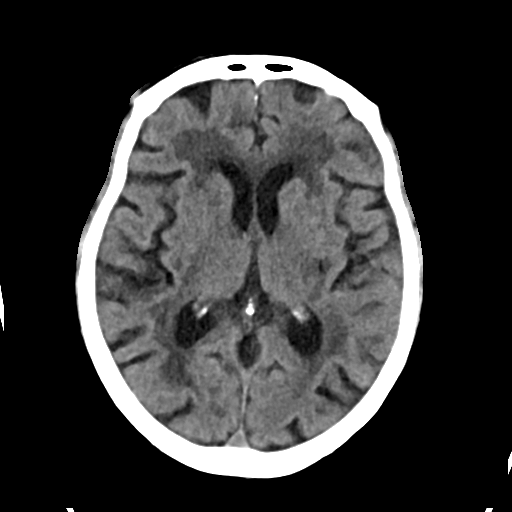
[im 19/31  brain]
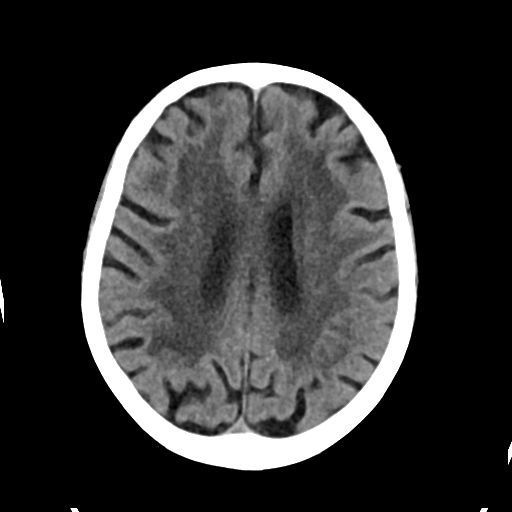
[im 19/31  bone]
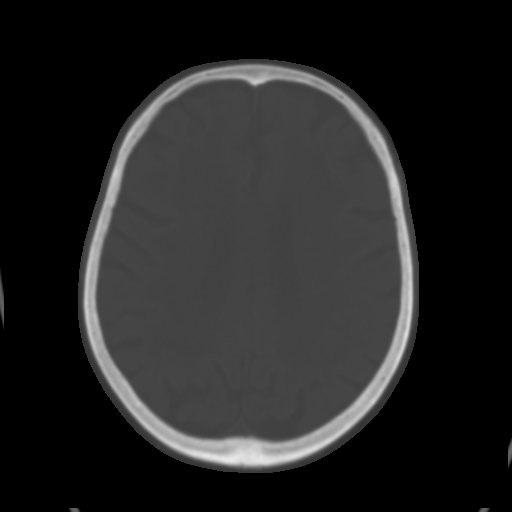
[im 23/31  brain]
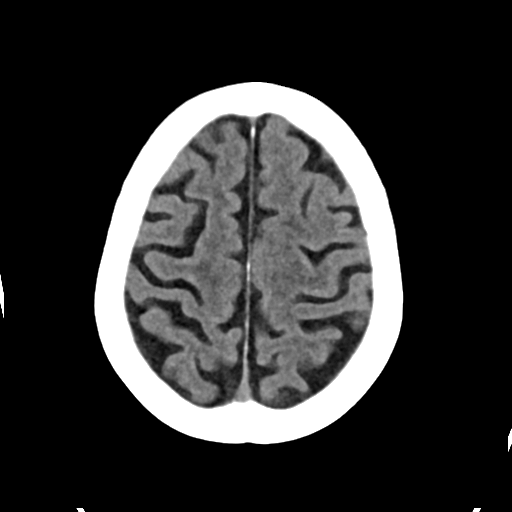
[im 27/31  brain]
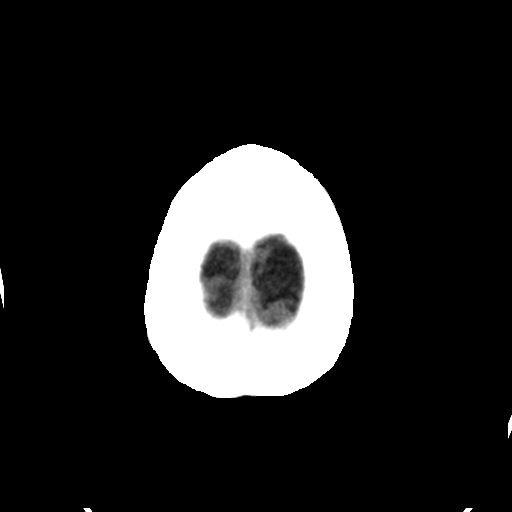

[Series 4: head bone · axial · 0.39mm/px · z∈[-131,-101]mm · 3 of 77 slices shown]
[im 8/77  bone]
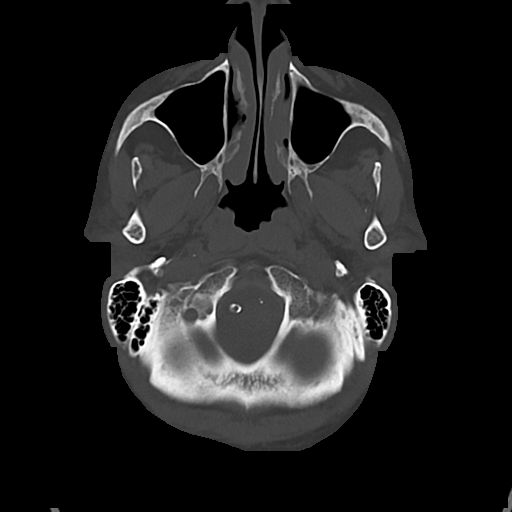
[im 16/77  bone]
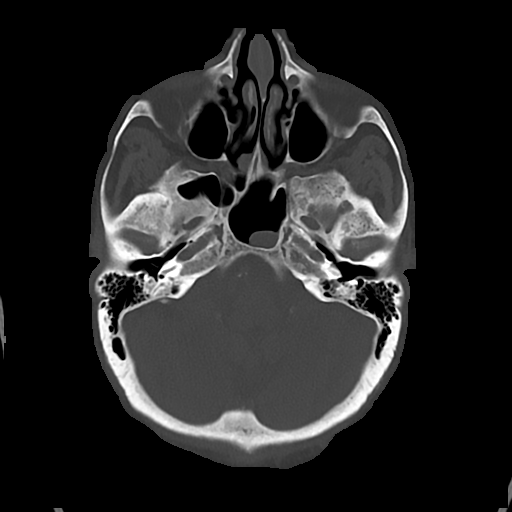
[im 23/77  bone]
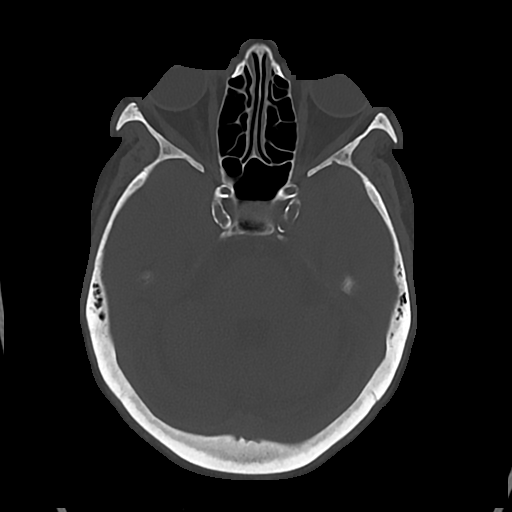

[Series 5: cor soft · coronal · 0.30mm/px · 3 of 64 slices shown]
[im 22/64  brain]
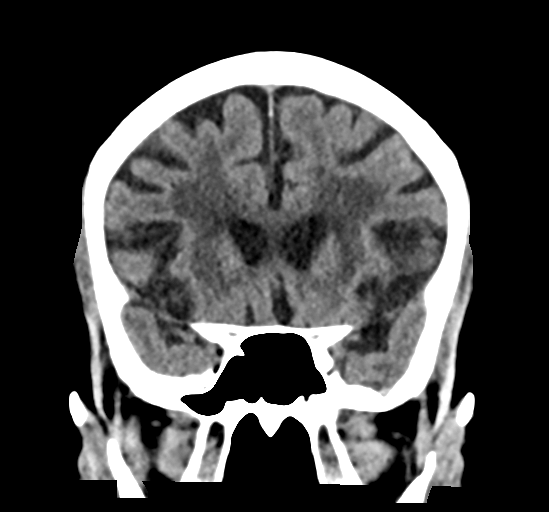
[im 29/64  brain]
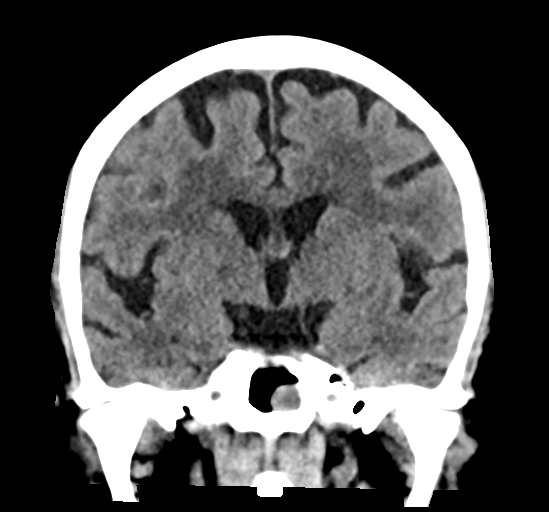
[im 36/64  brain]
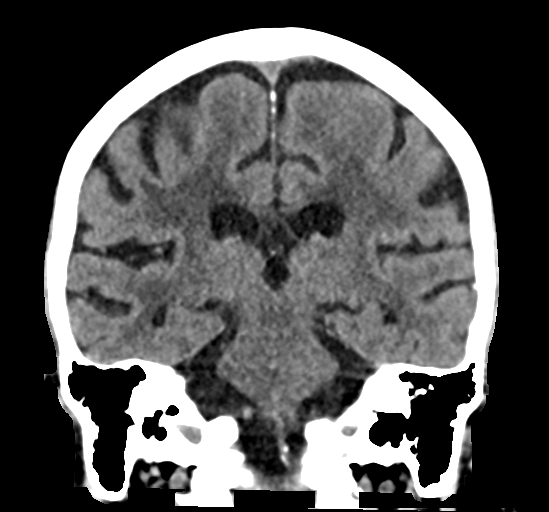

[Series 6: sag soft · sagittal · 0.29mm/px · 3 of 55 slices shown]
[im 19/55  brain]
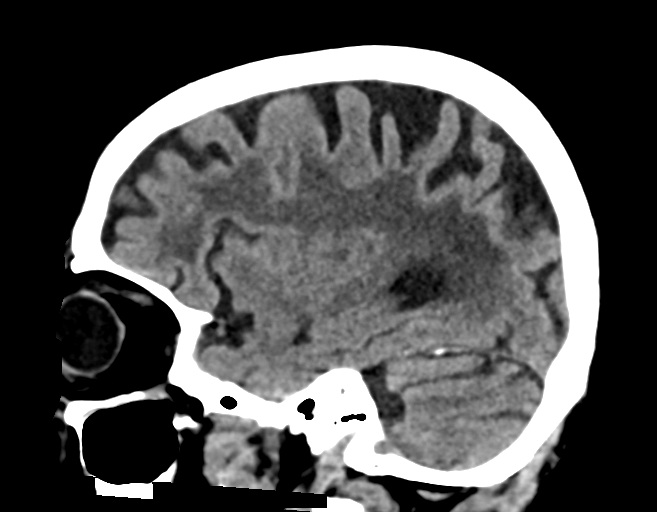
[im 28/55  brain]
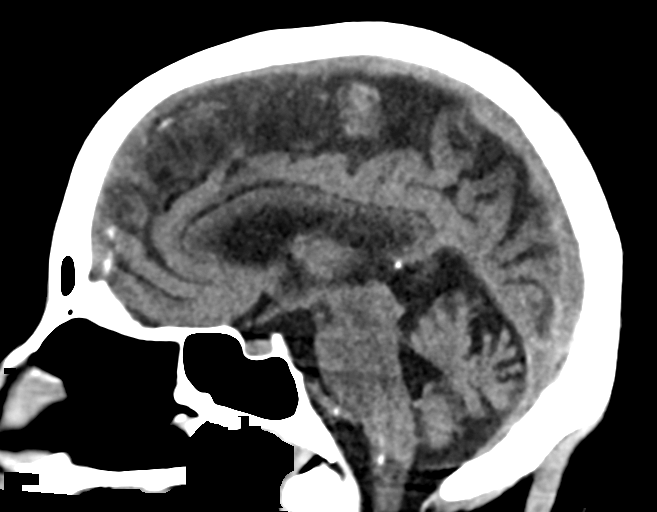
[im 37/55  brain]
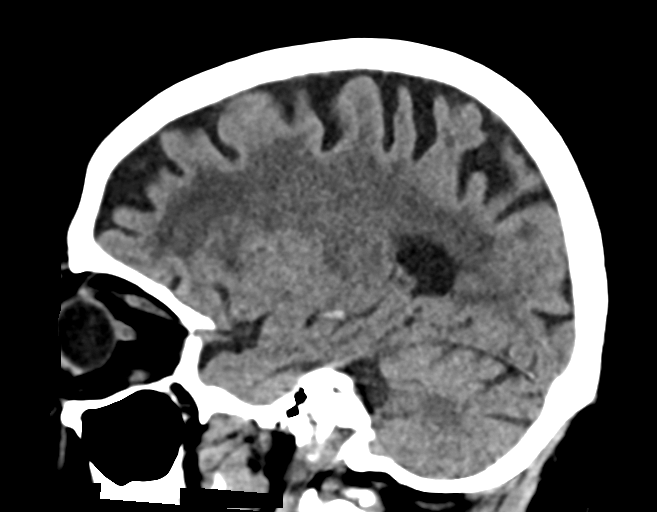

[16 of 47 positions shown; findings below may reference images not displayed]

FINDINGS: Brain: Moderate age-related atrophy and chronic microvascular
ischemic changes. There is no acute intracranial hemorrhage. No mass
effect or midline shift. No extra-axial fluid collection.

Vascular: No hyperdense vessel or unexpected calcification.

Skull: Normal. Negative for fracture or focal lesion.

Sinuses/Orbits: Small retention cyst or polyp in the left sphenoid
sinus. The remainder of the visualized paranasal sinuses and mastoid
air cells are clear. No air-fluid level. Partially visualized cystic
area in the right maxilla at the right maxillary central incisor
root. This may represent an area of periapical lucency or a
dentigerous cyst. Correlation with dental exam recommended.

Other: None
IMPRESSION: 1. No acute intracranial pathology.
2. Moderate age-related atrophy and chronic microvascular ischemic
changes.

## 2020-12-02 MED ORDER — SODIUM CHLORIDE 0.9 % IV SOLN: INTRAVENOUS | Status: DC

## 2020-12-02 NOTE — ED Triage Notes (Signed)
Patient complains of leg weakness and is losing inability to walk again since last Friday. Patient reports that she fell yesterday and unsure if she hit her head. Patient alert and reports recent admission to ICU for same earlier in month. Patient had full dialysis treatment today.

## 2020-12-02 NOTE — ED Notes (Signed)
Pt has returned from MRI. 

## 2020-12-02 NOTE — H&P (Signed)
History and Physical    PLEASE NOTE THAT DRAGON DICTATION SOFTWARE WAS USED IN THE CONSTRUCTION OF THIS NOTE.   Shawna [name]:[id number] DOB: [birth year] DOA: 12/02/2020  PCP: [facility] Patient coming from: home   I have personally briefly reviewed patient's old medical records in [place]  Chief Complaint: unsteadiness on feet  HPI: Shawna [name] is a 80 y.o. female with medical history significant for paroxysmal atrial fibrillation, end-stage renal disease on hemodialysis (M,W,F), chronic diastolic heart failure, hypertension, hyperlipidemia, who is admitted to [facility] on 12/02/2020 with suspected myositis after presenting from home to [facility] ED complaining of unsteadiness on feet.   The patient reports 4-5 of feeling unsteady on her feet. Over that time, she notes symmetrical weakness in the b/l LE's a/w some myalgias in symmetrical distribution, mainly in the anterior aspects of the b/l thighs. In this setting of this recent development of symetrical weakness and myalgias involving the b/l LE's she reports new difficultly with ambulation and conveys concern that she will be fall as a consequence of these symptoms.  Denies any associated weakness involving the upper extremities, also denying any recent acute focal paresthesias, numbness, slurred speech, facial droop, acute change in vision, vertigo, dysphagia.  Denies any trauma or falls leading up to development of the above symptoms, and reports that she has not fallen as a consequence of interval development of the symptoms, although she conveys that her husband has need to "catch her" a few times over the last couple of days when she felt as if she may fall while ambulating.  Denies any associated shortness of breath, chest pain, palpitations, diaphoresis, nausea, vomiting, dizziness, presyncope, or syncope.  No recent acute back pain.   Not associate with any subjective fever, chills, or rigors.  She also  denies any recent headache, neck stiffness, rhinitis, rhinorrhea, sore throat, wheezing, cough, abdominal pain, diarrhea, or rash.  No recent traveling or known COVID-19 exposures.    She acknowledges a history of hyperlipidemia, for which she is on simvastatin, reporting that she has been on the same dose of this medication for the last few years.  Medical history also notable for history of paroxysmal atrial fibrillation for which she is managed via rhythm control via daily oral amiodarone at home.  In the setting of a history of GI bleed , the patient reports that she is not on formal chronic anticoagulation, but rather is on a daily baby aspirin.  Most recent echocardiogram was performed in June 2020 was notable for left ventricle associated with normal cavity size, LVEF 60 to 65%, mild concentric LVH, evidence of diastolic dysfunction, mildly dilated left atrium, and mild to moderate mitral regurgitation.   She also has a history of end-stage renal disease on hemodialysis on Monday, Wednesday, Friday schedule, with most recent hemodialysis session occurring earlier today and patient conveying that this represented a complete session.  Denies any recent worsening of peripheral edema, and also denies any recent orthopnea/PND.     ED Course:  Vital signs in the ED were notable for the following: Tetramex 98.6, heart rate 61-72; blood pressure 177/41; respiratory rate 16, oxygen saturation 96 to 98% on room air.  Labs were notable for the following: CBC notable for white blood cell count 6000, hemoglobin 10.6.  CMP has been ordered, with result currently pending.  Nasopharyngeal COVID-19/influenza PCR has been ordered, with result currently pending.  EKG, in comparison to most recent prior from 11/11/2020, showed sinus rhythm,  with known left bundle branch block, heart rate 62, no evidence of interval T wave or ST changes, including no evidence of interval ST elevation.  Noncontrast CT that showed no  evidence of acute intracranial process, including no evidence of intracranial hemorrhage or acute ischemic infarct.  MRI of the brain also showed no evidence of acute intracranial process, including no evidence of acute ischemic infarct.  EDP consulted the on-call neurologist, Dr. Lorrin Goodell, who has evaluated the patient and is concerned for myositis in the setting of evidence of bilateral lower extremity proximal muscle weakness as well as associated myalgias. He conveys current differential that includes potential pharmacologic contributing factors, namely simvastatin versus amiodarone. Dr. Lorrin Goodell recommends holding home simvastatin for now, and has ordered further evaluation with the following: Protein electrophoresis, ESR, CRP, ANA, aldolase, CPK. Additionally, he has ordered MRI of the left femur to evaluate for radiographic evidence of myositis.  While in the ED, the following were administered: (none).      Review of Systems: As per HPI otherwise 10 point review of systems negative.   Past Medical History:  Diagnosis Date  . Acute on chronic respiratory failure with hypoxia ([place]) 10/10/2016  . Anxiety   . Arthritis   . AVM (arteriovenous malformation) of colon   . CAD (coronary artery disease)    a. s/p CABG in 2013 b. DES to D1 in 10/2016. c. cath in 07/2018 showing patent grafts with occlusion of D1 at prior stent site and progression of PDA disease --> medical management recommended  . Carotid artery disease ([place])    a. 11-94% LICA, 07/7406   . Chronic anemia   . Chronic bronchitis ([name])   . Chronic diastolic CHF (congestive heart failure) ([place])    a. 02/2012 Echo EF 60-65%, nl wall motion, Gr 1 DD, mod MR  . Colon cancer ([place]) 1992  . Esophageal stricture   . ESRD on hemodialysis ([place])    ESRD due to HTN, started dialysis 2011 and gets HD at [facility] with Dr Hinda Lenis on MWF schedule.  Access is LUA AVF as of Sept 2014.   Marland Kitchen GERD (gastroesophageal reflux disease)   . High  cholesterol 12/2011  . History of blood transfusion 07/2011; 12/2011; 01/2012 X 2; 04/2012  . History of gout   . History of lower GI bleeding   . Hypertension   . Iron deficiency anemia   . Mitral regurgitation    a. Moderate by echo, 02/2012  . Myocardial infarction ([place])   . NSVT (nonsustained ventricular tachycardia) ([place])   . Ovarian cancer ([place]) 1992  . PAF (paroxysmal atrial fibrillation) ([place])   . Pneumonia ~ 2009  . PUD (peptic ulcer disease)   . TIA (transient ischemic attack)     Past Surgical History:  Procedure Laterality Date  . A/V SHUNTOGRAM Left 03/19/2019   Procedure: A/V SHUNTOGRAM;  Surgeon: Katha Cabal, MD;  Location: [place] CV LAB;  Service: Cardiovascular;  Laterality: Left;  . ABDOMINAL HYSTERECTOMY  1992  . APPENDECTOMY  06/1990  . AV FISTULA PLACEMENT  07/2009   left upper arm  . AV FISTULA PLACEMENT Right 09/06/2016   Procedure: RIGHT FOREARM ARTERIOVENOUS (AV) GRAFT;  Surgeon: Elam Dutch, MD;  Location: [facility] OR;  Service: Vascular;  Laterality: Right;  . AV FISTULA PLACEMENT N/A 02/24/2017   Procedure: INSERTION OF ARTERIOVENOUS (AV) GORE-TEX GRAFT ARM (BRACHIAL AXILLARY);  Surgeon: Katha Cabal, MD;  Location: [facility] ORS;  Service: Vascular;  Laterality: N/A;  . AVGG  REMOVAL Right 09/06/2016   Procedure: REMOVAL OF Right Arm ARTERIOVENOUS GORETEX GRAFT and Vein Patch angioplasty of brachial artery;  Surgeon: Angelia Mould, MD;  Location: [place];  Service: Vascular;  Laterality: Right;  . BIOPSY  09/26/2019   Procedure: BIOPSY;  Surgeon: Rogene Houston, MD;  [facility];  Service: Endoscopy;;  . COLON RESECTION  1992  . COLON SURGERY    . COLONOSCOPY N/A 03/09/2019   Procedure: COLONOSCOPY;  Surgeon: Rogene Houston, MD;  [facility];  Service: Endoscopy;  Laterality: N/A;  . CORONARY ANGIOPLASTY WITH STENT PLACEMENT  12/15/11   "2"  . CORONARY ANGIOPLASTY WITH STENT PLACEMENT  y/2013   "1; makes total of 3"  (05/02/2012)  . CORONARY ARTERY BYPASS GRAFT  06/13/2012   Procedure: CORONARY ARTERY BYPASS GRAFTING (CABG);  Surgeon: Grace Isaac, MD;  Location: [place];  Service: Open Heart Surgery;  Laterality: N/A;  cabg x four;  using left internal mammary artery, and left leg greater saphenous vein harvested endoscopically  . CORONARY STENT INTERVENTION N/A 10/13/2016   Procedure: Coronary Stent Intervention;  Surgeon: Troy Sine, MD;  Location: [place] CV LAB;  Service: Cardiovascular;  Laterality: N/A;  . DIALYSIS/PERMA CATHETER REMOVAL N/A 04/18/2017   Procedure: DIALYSIS/PERMA CATHETER REMOVAL;  Surgeon: Katha Cabal, MD;  Location: [place] CV LAB;  Service: Cardiovascular;  Laterality: N/A;  . DILATION AND CURETTAGE OF UTERUS    . ESOPHAGOGASTRODUODENOSCOPY  01/20/2012   Procedure: ESOPHAGOGASTRODUODENOSCOPY (EGD);  Surgeon: Ladene Artist, MD,FACG;  Location: [facility] ENDOSCOPY;  Service: Endoscopy;  Laterality: N/A;  . ESOPHAGOGASTRODUODENOSCOPY N/A 03/26/2013   Procedure: ESOPHAGOGASTRODUODENOSCOPY (EGD);  Surgeon: Irene Shipper, MD;  Location: [facility] ENDOSCOPY;  Service: Endoscopy;  Laterality: N/A;  . ESOPHAGOGASTRODUODENOSCOPY N/A 04/30/2015   Procedure: ESOPHAGOGASTRODUODENOSCOPY (EGD);  Surgeon: Rogene Houston, MD;  [facility];  Service: Endoscopy;  Laterality: N/A;  1pm - moved to 10/20 @ 1:10  . ESOPHAGOGASTRODUODENOSCOPY N/A 07/29/2016   Procedure: ESOPHAGOGASTRODUODENOSCOPY (EGD);  Surgeon: Manus Gunning, MD;  Location: [place];  Service: Gastroenterology;  Laterality: N/A;  enteroscopy  . ESOPHAGOGASTRODUODENOSCOPY N/A 09/26/2019   Procedure: ESOPHAGOGASTRODUODENOSCOPY (EGD);  Surgeon: Rogene Houston, MD;  [facility];  Service: Endoscopy;  Laterality: N/A;  1250  . GIVENS CAPSULE STUDY N/A 03/07/2019   Procedure: GIVENS CAPSULE STUDY;  Surgeon: Rogene Houston, MD;  [facility];  Service: Endoscopy;  Laterality: N/A;  7:30  .  INSERTION OF DIALYSIS CATHETER N/A 10/05/2020   Procedure: ABORTED TUNNELED DIALYSIS CATHETER PLACEMENT RIGHT INTERNAL JUGULAR VEIN ;  Surgeon: Virl Cagey, MD;  Location: [facility];  Service: General;  Laterality: N/A;  . INTRAOPERATIVE TRANSESOPHAGEAL ECHOCARDIOGRAM  06/13/2012   Procedure: INTRAOPERATIVE TRANSESOPHAGEAL ECHOCARDIOGRAM;  Surgeon: Grace Isaac, MD;  Location: [place];  Service: Open Heart Surgery;  Laterality: N/A;  . IR DIALY SHUNT INTRO NEEDLE/INTRACATH INITIAL W/IMG LEFT Left 10/06/2020  . IR FLUORO GUIDE CV LINE RIGHT  06/17/2020  . IR GENERIC HISTORICAL  07/26/2016   IR FLUORO GUIDE CV LINE RIGHT 07/26/2016 Greggory Keen, MD [facility]  . IR GENERIC HISTORICAL  07/26/2016   IR US GUIDE VASC ACCESS RIGHT 07/26/2016 Greggory Keen, MD [facility]  . IR GENERIC HISTORICAL  08/02/2016   IR US GUIDE VASC ACCESS RIGHT 08/02/2016 Greggory Keen, MD [facility]  . IR GENERIC HISTORICAL  08/02/2016   IR FLUORO GUIDE CV LINE RIGHT 08/02/2016 Greggory Keen, MD [facility]  [facility]  . IR RADIOLOGY PERIPHERAL GUIDED IV START  03/28/2017  . IR REMOVAL TUN CV CATH W/O FL  08/11/2020  . IR THROMBECTOMY AV FISTULA W/THROMBOLYSIS INC/SHUNT/IMG LEFT Left 06/17/2020  . IR US GUIDE VASC ACCESS LEFT  06/17/2020  . IR US GUIDE VASC ACCESS RIGHT  03/28/2017  . IR US GUIDE VASC ACCESS RIGHT  06/17/2020  . LEFT HEART CATH AND CORONARY ANGIOGRAPHY N/A 09/20/2016   Procedure: Left Heart Cath and Coronary Angiography;  Surgeon: [name], MD;  Location: [place] CV LAB;  Service: Cardiovascular;  Laterality: N/A;  . LEFT HEART CATH AND CORS/GRAFTS ANGIOGRAPHY N/A 10/13/2016   Procedure: Left Heart Cath and Cors/Grafts Angiography;  Surgeon: Troy Sine, MD;  Location: [place] CV LAB;  Service: Cardiovascular;  Laterality: N/A;  . LEFT HEART CATH AND CORS/GRAFTS ANGIOGRAPHY N/A 07/13/2018   Procedure: LEFT HEART CATH AND CORS/GRAFTS ANGIOGRAPHY;  Surgeon: Martinique, Peter M, MD;  Location: [place] CV  LAB;  Service: Cardiovascular;  Laterality: N/A;  . LEFT HEART CATHETERIZATION WITH CORONARY ANGIOGRAM N/A 12/15/2011   Procedure: LEFT HEART CATHETERIZATION WITH CORONARY ANGIOGRAM;  Surgeon: Burnell Blanks, MD;  Location: [facility] CATH LAB;  Service: Cardiovascular;  Laterality: N/A;  . LEFT HEART CATHETERIZATION WITH CORONARY ANGIOGRAM N/A 01/10/2012   Procedure: LEFT HEART CATHETERIZATION WITH CORONARY ANGIOGRAM;  Surgeon: Peter M Martinique, MD;  Location: [facility] CATH LAB;  Service: Cardiovascular;  Laterality: N/A;  . LEFT HEART CATHETERIZATION WITH CORONARY ANGIOGRAM N/A 06/08/2012   Procedure: LEFT HEART CATHETERIZATION WITH CORONARY ANGIOGRAM;  Surgeon: Burnell Blanks, MD;  Location: [facility] CATH LAB;  Service: Cardiovascular;  Laterality: N/A;  . LEFT HEART CATHETERIZATION WITH CORONARY/GRAFT ANGIOGRAM N/A 12/10/2013   Procedure: LEFT HEART CATHETERIZATION WITH Beatrix Fetters;  Surgeon: [name], MD;  Location: [facility] CATH LAB;  Service: Cardiovascular;  Laterality: N/A;  . OVARY SURGERY     ovarian cancer  . POLYPECTOMY  03/09/2019   Procedure: POLYPECTOMY;  Surgeon: Rogene Houston, MD;  [facility];  Service: Endoscopy;;  cecal   . POLYPECTOMY N/A 09/26/2019   Procedure: DUODENAL POLYPECTOMY;  Surgeon: Rogene Houston, MD;  [facility];  Service: Endoscopy;  Laterality: N/A;  . REVISION OF ARTERIOVENOUS GORETEX GRAFT N/A 02/24/2017   Procedure: REVISION OF ARTERIOVENOUS GORETEX GRAFT (RESECTION);  Surgeon: Katha Cabal, MD;  Location: [facility] ORS;  Service: Vascular;  Laterality: N/A;  . REVISON OF ARTERIOVENOUS FISTULA Left 06/19/2020   Procedure: REVISION OF LEFT UPPER ARM AV GRAFT WITH INTERPOSITION JUMP GRAFT USING 6MM GORE LIMB;  Surgeon: Marty Heck, MD;  Location: [place];  Service: Vascular;  Laterality: Left;  . SHUNTOGRAM N/A 10/15/2013   Procedure: Fistulogram;  Surgeon: Serafina Mitchell, MD;  Location: [facility] CATH LAB;  Service:  Cardiovascular;  Laterality: N/A;  . THROMBECTOMY / ARTERIOVENOUS GRAFT REVISION  2011   left upper arm  . TUBAL LIGATION  1980's  . UPPER EXTREMITY ANGIOGRAPHY Bilateral 12/06/2016   Procedure: Upper Extremity Angiography;  Surgeon: Katha Cabal, MD;  Location: [place] CV LAB;  Service: Cardiovascular;  Laterality: Bilateral;  . UPPER EXTREMITY INTERVENTION Left 06/06/2017   Procedure: UPPER EXTREMITY INTERVENTION;  Surgeon: Katha Cabal, MD;  Location: [place] CV LAB;  Service: Cardiovascular;  Laterality: Left;    Social History:  reports that she has never smoked. She has never used smokeless tobacco. She reports that she does not drink alcohol and does not use drugs.  Allergies  Allergen Reactions  . Aspirin Other (See Comments)    High Doses Mess up her stomach; "makes my bowels have blood in them". Takes 81 mg EC Aspirin   . Penicillins Other (See Comments)    SYNCOPE? , "makes me real weak when I take it; like I'll pass out"  Has patient had a PCN reaction causing immediate rash, facial/tongue/throat swelling, SOB or lightheadedness with hypotension: Yes Has patient had a PCN reaction causing severe rash involving mucus membranes or skin necrosis: no Has patient had a PCN reaction that required hospitalization no Has patient had a PCN reaction occurring within the last 10 years: no If all of the above  . Amlodipine Swelling  . Bactrim [Sulfamethoxazole-Trimethoprim] Rash  . Contrast Media [Iodinated Diagnostic Agents] Itching  . Iron Itching and Other (See Comments)    "they gave me iron in dialysis; had to give me Benadryl cause I had to have the iron" (05/02/2012)  . Nitrofurantoin Hives  . Tylenol [Acetaminophen] Itching and Other (See Comments)    Makes her feet on fire per pt  . Gabapentin Other (See Comments)    Unknown reaction  . Hydralazine Itching  . Levofloxacin   . No Known Allergies   . Ranexa [Ranolazine] Other (See Comments)     Myoclonus-hospitalized   . Dexilant [Dexlansoprazole] Other (See Comments)    Upset stomach  . Levaquin [Levofloxacin In D5w] Rash  . Morphine And Related Itching and Other (See Comments)    Itching in feet  . Plavix [Clopidogrel Bisulfate] Rash  . Protonix [Pantoprazole Sodium] Rash  . Venofer [Ferric Oxide] Itching and Other (See Comments)    Patient reports using Benadryl prior to doses as [facility]    Family History  Problem Relation Age of Onset  . Heart disease Mother        Heart Disease before age 34  . Hyperlipidemia Mother   . Hypertension Mother   . Diabetes Mother   . Heart attack Mother   . Heart disease Father        Heart Disease before age 36  . Hyperlipidemia Father   . Hypertension Father   . Diabetes Father   . Diabetes Sister   . Hypertension Sister   . Diabetes Brother   . Hyperlipidemia Brother   . Heart attack Brother   . Hypertension Sister   . Heart attack Brother   . Colon cancer Child 31  . Other Other        noncontributory for early CAD  . Esophageal cancer Neg Hx   . Liver disease Neg Hx   . Kidney disease Neg Hx   . Colon polyps Neg Hx     Family history reviewed and not pertinent    Prior to Admission medications   Medication Sig Start Date End Date Taking? Authorizing Provider  amiodarone (PACERONE) 200 MG tablet Take 1 tablet (200 mg total) by mouth daily. Starting July 2 12/17/19  Yes Strader, Fransisco Hertz, PA-C  aspirin EC 81 MG tablet Take 81 mg by mouth daily.  09/27/19  Yes Rehman, Mechele Dawley, MD  cyclobenzaprine (FLEXERIL) 5 MG tablet Take 5 mg by mouth 3 (three) times daily as needed for muscle spasms. 11/30/20  Yes [provider]  fluticasone (FLONASE) 50 MCG/ACT nasal spray Place 1 spray into both nostrils at bedtime as needed for allergies.    Yes [provider]  hydrOXYzine (VISTARIL) 100 MG capsule Take 100 mg by mouth 2 (two)  times daily as needed for anxiety or itching. 11/23/20  Yes [provider]  isosorbide mononitrate (IMDUR) 60 MG 24 hr tablet Take 1 tablet (60 mg total) by mouth in the morning and at bedtime. 10/20/20 01/18/21 Yes Strader, Fransisco Hertz, PA-C  lidocaine-prilocaine (EMLA) cream Apply 1 application topically every Monday, Wednesday, and Friday. Prior to dialysis   Yes [provider]  loperamide (IMODIUM A-D) 2 MG tablet Take 1 tablet (2 mg total) by mouth daily before breakfast. Patient taking differently: Take 4 mg by mouth daily as needed for diarrhea or loose stools. 12/01/20  Yes Rehman, Mechele Dawley, MD  loratadine (CLARITIN) 10 MG tablet Take 1 tablet (10 mg total) by mouth daily as needed for allergies. 07/14/18  Yes Hosie Poisson, MD  multivitamin (RENA-VIT) TABS tablet Take 1 tablet by mouth daily.   Yes [provider]  nitroGLYCERIN (NITROSTAT) 0.4 MG SL tablet Place 1 tablet (0.4 mg total) under the tongue every 5 (five) minutes x 3 doses as needed for chest pain (if no relief after 2nd dose, proceed to the ED for an evaluation or call 911). 09/08/20  Yes Verta Ellen., NP  omeprazole (PRILOSEC) 20 MG capsule Take 1 capsule (20 mg total) by mouth daily. 12/01/20  Yes Rehman, Mechele Dawley, MD  sevelamer carbonate (RENVELA) 800 MG tablet Take 2 tablets (1,600 mg total) by mouth 3 (three) times daily with meals. 06/20/20  Yes Meccariello, Bernita Raisin, DO  simvastatin (ZOCOR) 20 MG tablet Take 1 tablet (20 mg total) by mouth at bedtime. 07/07/20  Yes Verta Ellen., NP  triamcinolone (KENALOG) 0.025 % ointment Apply 1 application topically 2 (two) times daily as needed (itching). 11/20/20  Yes [provider]  triamcinolone cream (KENALOG) 0.1 % Apply 1 application topically 2 (two) times daily as needed (itching). 11/19/20  Yes [provider]     Objective    Physical Exam: Vitals:   12/02/20 2210 12/02/20 2215 12/02/20 2230 12/02/20 2330  BP:  (!) 183/56 (!) 183/55 (!) 188/50  Pulse: 65 66 68 72  Resp:   16 16   SpO2: 99% 98% 97% 93%    General: appears to be stated age; alert, oriented Skin: warm, dry, no rash Head:  AT/[place] Mouth:  Oral mucosa membranes appear moist, normal dentition Neck: supple; trachea midline Heart:  RRR; did not appreciate any M/R/G Lungs: CTAB, did not appreciate any wheezes, rales, or rhonchi Abdomen: + BS; soft, ND, NT Vascular: 2+ pedal pulses b/l; 2+ radial pulses b/l Extremities: trace edema in b/l LE's, no muscle wasting Neuro: sensation intact in upper and lower extremities b/l; strength: 5/5 in b/l upper extremities; 4/5 in b/l lower extremities.     Labs on Admission: I have personally reviewed following labs and imaging studies  CBC: Recent Labs  Lab 12/02/20 1746  WBC 6.0  NEUTROABS 4.0  HGB 10.6*  HCT 35.0*  MCV 120.7*  PLT 828*   Basic Metabolic Panel: No results for input(s): NA, K, CL, CO2, GLUCOSE, BUN, CREATININE, CALCIUM, MG, PHOS in the last 168 hours. GFR: CrCl cannot be calculated (Patient's most recent lab result is older than the maximum 21 days allowed.). Liver Function Tests: No results for input(s): AST, ALT, ALKPHOS, BILITOT, PROT, ALBUMIN in the last 168 hours. No results for input(s): LIPASE, AMYLASE in the last 168 hours. No results for input(s): AMMONIA in the last 168 hours. Coagulation Profile: No results for input(s): INR, PROTIME in the last 168  hours. Cardiac Enzymes: No results for input(s): CKTOTAL, CKMB, CKMBINDEX, TROPONINI in the last 168 hours. BNP (last 3 results) No results for input(s): PROBNP in the last 8760 hours. HbA1C: No results for input(s): HGBA1C in the last 72 hours. CBG: No results for input(s): GLUCAP in the last 168 hours. Lipid Profile: No results for input(s): CHOL, HDL, LDLCALC, TRIG, CHOLHDL, LDLDIRECT in the last 72 hours. Thyroid Function Tests: No results for input(s): TSH, T4TOTAL, FREET4, T3FREE, THYROIDAB in the last 72 hours. Anemia Panel: No results for input(s): VITAMINB12,  FOLATE, FERRITIN, TIBC, IRON, RETICCTPCT in the last 72 hours. Urine analysis:    Component Value Date/Time   COLORURINE YELLOW 12/16/2012 1919   APPEARANCEUR CLOUDY (A) 12/16/2012 1919   LABSPEC 1.009 12/16/2012 1919   PHURINE 7.5 12/16/2012 1919   GLUCOSEU NEGATIVE 12/16/2012 1919   HGBUR TRACE (A) 12/16/2012 1919   BILIRUBINUR NEGATIVE 12/16/2012 1919   KETONESUR NEGATIVE 12/16/2012 1919   PROTEINUR 100 (A) 12/16/2012 1919   UROBILINOGEN 0.2 12/16/2012 1919   NITRITE NEGATIVE 12/16/2012 1919   LEUKOCYTESUR SMALL (A) 12/16/2012 1919    Radiological Exams on Admission: CT Head Wo Contrast  Result Date: 12/02/2020 CLINICAL DATA:  80 year old female with head trauma. EXAM: CT HEAD WITHOUT CONTRAST TECHNIQUE: Contiguous axial images were obtained from the base of the skull through the vertex without intravenous contrast. COMPARISON:  Head CT dated 11/09/2020. FINDINGS: Brain: Moderate age-related atrophy and chronic microvascular ischemic changes. There is no acute intracranial hemorrhage. No mass effect or midline shift. No extra-axial fluid collection. Vascular: No hyperdense vessel or unexpected calcification. Skull: Normal. Negative for fracture or focal lesion. Sinuses/Orbits: Small retention cyst or polyp in the left sphenoid sinus. The remainder of the visualized paranasal sinuses and mastoid air cells are clear. No air-fluid level. Partially visualized cystic area in the right maxilla at the right maxillary central incisor root. This may represent an area of periapical lucency or a dentigerous cyst. Correlation with dental exam recommended. Other: None IMPRESSION: 1. No acute intracranial pathology. 2. Moderate age-related atrophy and chronic microvascular ischemic changes. Electronically Signed   By: Anner Crete M.D.   On: 12/02/2020 19:25   MR Brain Wo Contrast (neuro protocol)  Result Date: 12/02/2020 CLINICAL DATA:  Initial evaluation for acute dizziness. EXAM: MRI HEAD  WITHOUT CONTRAST TECHNIQUE: Multiplanar, multiecho pulse sequences of the brain and surrounding structures were obtained without intravenous contrast. COMPARISON:  Prior CT from earlier the same day as well as recent MRI from 11/10/2020. FINDINGS: Brain: Examination degraded by motion artifact. Generalized age-related cerebral atrophy. Fairly severe chronic microvascular ischemic disease noted involving the periventricular deep white matter both cerebral hemispheres, with more mild patchy involvement of the pons. Remote lacunar infarct at the left basal ganglia. Few additional small remote left cerebellar infarcts noted. No convincing foci of restricted diffusion to suggest acute or subacute ischemia. Gray-white matter differentiation maintained. No encephalomalacia to suggest chronic cortical infarction. No acute intracranial hemorrhage. Few scattered chronic micro hemorrhages noted about the cerebellum and right cerebral hemisphere, likely small vessel related. No mass lesion, midline shift or mass effect. No hydrocephalus or extra-axial fluid collection. Pituitary gland suprasellar region normal. Midline structures intact. Vascular: Major intracranial vascular flow voids are grossly maintained at the skull base. Skull and upper cervical spine: Craniocervical junction within normal limits. Bone marrow signal intensity normal. No focal marrow replacing lesion. No scalp soft tissue abnormality. Sinuses/Orbits: Globes and orbital soft tissues demonstrate no acute finding. Mild mucosal thickening noted within the  left sphenoid sinus. Paranasal sinuses are otherwise clear. No mastoid effusion. Other: None. IMPRESSION: 1. No acute intracranial abnormality. 2. Age-related cerebral atrophy with advanced chronic microvascular ischemic disease, with a few scattered remote lacunar infarcts involving the left basal ganglia and left cerebellum. Electronically Signed   By: Jeannine Boga M.D.   On: 12/02/2020 22:43      EKG: Independently reviewed, with result as described above.    Assessment/Plan   Shawna [name] is a 80 y.o. female with medical history significant for paroxysmal atrial fibrillation, end-stage renal disease on hemodialysis (M,W,F), chronic diastolic heart failure, hypertension, hyperlipidemia, who is admitted to [facility] on 12/02/2020 with suspected myositis after presenting from home to [facility] ED complaining of unsteadiness on feet.    Principal Problem:   Myositis Active Problems:   ESRD on hemodialysis (HCC)   HLD (hyperlipidemia)   Chronic diastolic CHF (congestive heart failure) (HCC)   Essential hypertension   Paroxysmal atrial fibrillation (HCC)    #) Myositis: on-call neurologist, Dr.Khaliqdina has been formally consulted and after evaluating patient in the ED is concerned for myositis given symmetrical weakness of the proximal muscles associate with the bilateral lower extremities as well as associated myalgias. He conveys current differential that includes potential pharmacologic contributing factors, namely simvastatin versus amiodarone. Dr. Lorrin Goodell recommends holding home simvastatin for now, and has ordered further evaluation with the following: Protein electrophoresis, ESR, CRP, ANA, aldolase, CPK. Additionally, he has ordered MRI of the left femur to evaluate for radiographic evidence of myositis. No evidence of acute stroke with noncontrast CT head as well as MRI of the brain showing no evidence of acute intracranial process, including no evidence of intracranial hemorrhage or acute ischemic infarct. Will hold home statin pending the above evaluation.  In the setting of a history of paroxysmal atrial fibrillation, will also hold home amiodarone overnight, although it is acknowledged that most recent dose occurring on the morning of 12/02/2020, with consideration for subsequent discussions with cardiology regarding alternate options for rhythm control strategy vs  rate control strategy.   Plan: Neurology formally consulted, as above.  Follow results of the following laboratory evaluation ordered by neurology: Protein electrophoresis, ESR, CRP, ANA, aldolase, and CPK.  Additionally, will follow for result of MRI of the left femur, also ordered via neurology consult, as above.  Hold home statin for now. Also holding home Amiodarone for now, with consideration for discussions with cards regarding alternate rhythm control options, as above.  Follow-up for results of CMP, with plan to repeat CMP, serum in the AM.  Fall Precautions ordered.  PT/OT consults been placed for the morning.        #) Paroxysmal atrial fibrillation: Documented history of such. In the setting of a CHA2DS2-VASc score of 7, there is an indication for the patient to be on chronic anticoagulation for thromboembolic prophylaxis.  However, in the setting of a history of recurrent acute gastrointestinal bleed, the patient conveys that, following additional risk versus benefits discussions with her PCP regarding formal chronic anticoagulation, she has elected to refrain from such, rather is on daily baby aspirin.  Rhythm control strategy pursued as an outpatient on daily amiodarone.  Not on any AV nodal blocking agents at home.  Presenting EKG reflects normal sinus rhythm.  Most recent echocardiogram, which was performed in June 2020 showed results as further described above.    Plan: monitor strict I's & O's and daily weights. Repeat BMP and CBC in the morning. Check serum magnesium level.  Holding home amiodarone for now, as above.  Monitor on telemetry.  Continue home daily baby aspirin.       #) End-stage renal disease: On hemodialysis on Monday, Wednesday, Friday schedule, with most recent hemodialysis occurring this morning, and associated with a complete session at that time. Outpatient meds notable for renvela. No evidence of volume overload at this time.   Plan: Monitor strict I's and  O's and daily weights.  Follow-up results of CMP ordered in the ED setting.  Repeat CMP, serum magnesium, and phosphorus levels been ordered for the morning. Will warrant consultation to nephrology if she remains hospitalized leading up to her next scheduled hemodialysis day, which is Friday, 5/27 2022.  Continue home Renvela.       #) Hypertension: on Imdur.   Resume home Imdur.  Close monitoring and symptom pressure via routine vital signs.     #) Chronic diastolic heart failure: Most recent echocardiogram was performed in June 2020, was notable for evidence of diastolic dysfunction, with additional results as described above.  Complicated by end-stage renal disease on hemodialysis.  At this time, no evidence of acute volume overload.   Plan: Monitor strict I's and O's Daily weights.  Follow for result of CMP.  Add on serum magnesium level, with plan to repeat these labs in the morning.      DVT prophylaxis: SCDs Code Status: Full code Family Communication: none Disposition Plan: Per Rounding Team Consults called: on-call neurologist, Dr. Lorrin Goodell, [facility] consulted, as further described above Admission status: Inpatient; med telemetry     Of note, this patient was added by me to the following Admit List/Treatment Team: [email or web address].      PLEASE NOTE THAT DRAGON DICTATION SOFTWARE WAS USED IN THE CONSTRUCTION OF THIS NOTE.   [facility] Pager [phone] From [place]  Otherwise, please contact night-coverage  [email or web address] Password [facility]   12/02/2020, 11:50 PM

## 2020-12-02 NOTE — ED Notes (Signed)
Patient transported to CT 

## 2020-12-02 NOTE — ED Notes (Signed)
Lab draw re-attempt unsuccessful - Dr. Cheral Bay aware at the beside speaking with the patient now. This RN has asked [place] with Phlebotomy to come attempt to draw her labs.

## 2020-12-02 NOTE — ED Notes (Signed)
Small bruise noted above the patient's right eye as well as to eyelid. She reports she fell out of her bed when reaching for something on the nightstand. She states she hit her head on the trash can about 3-4 days ago "over the weekend."

## 2020-12-02 NOTE — ED Provider Notes (Signed)
[facility] EMERGENCY DEPARTMENT Provider Note   CSN: [id number] Arrival date & time: 12/02/20  1724     History No chief complaint on file.   Shawna [name] is a 80 y.o. female.  80 year old female presents with 6 days of feeling off balance and generalized weakness.  Denies any fever or chills.  No vomiting or diarrhea.  Patient went to dialysis today with for her full session.  No severe headaches.  States that she is had trouble ambulating.  Did fall and strike her head once.  LOC associated with that.  Has had to use a walker recently.        Past Medical History:  Diagnosis Date  . Acute on chronic respiratory failure with hypoxia ([place]) 10/10/2016  . Anxiety   . Arthritis   . AVM (arteriovenous malformation) of colon   . CAD (coronary artery disease)    a. s/p CABG in 2013 b. DES to D1 in 10/2016. c. cath in 07/2018 showing patent grafts with occlusion of D1 at prior stent site and progression of PDA disease --> medical management recommended  . Carotid artery disease ([place])    a. 39-76% LICA, 01/3418   . Chronic anemia   . Chronic bronchitis ([place])   . Chronic diastolic CHF (congestive heart failure) ([place])    a. 02/2012 Echo EF 60-65%, nl wall motion, Gr 1 DD, mod MR  . Colon cancer ([place]) 1992  . Esophageal stricture   . ESRD on hemodialysis ([place])    ESRD due to HTN, started dialysis 2011 and gets HD at [facility] with Dr Hinda Lenis on MWF schedule.  Access is LUA AVF as of Sept 2014.   Marland Kitchen GERD (gastroesophageal reflux disease)   . High cholesterol 12/2011  . History of blood transfusion 07/2011; 12/2011; 01/2012 X 2; 04/2012  . History of gout   . History of lower GI bleeding   . Hypertension   . Iron deficiency anemia   . Mitral regurgitation    a. Moderate by echo, 02/2012  . Myocardial infarction ([place])   . NSVT (nonsustained ventricular tachycardia) ([place])   . Ovarian cancer ([place]) 1992  . PAF (paroxysmal atrial fibrillation) ([place])   . Pneumonia ~  2009  . PUD (peptic ulcer disease)   . TIA (transient ischemic attack)     Patient Active Problem List   Diagnosis Date Noted  . Diabetes mellitus type 2 in nonobese ([place]) 11/10/2020  . Acute pulmonary edema ([place]) 11/10/2020  . Generalized weakness 11/09/2020  . Dialysis AV fistula malfunction, initial encounter ([place])   . Jugular vein occlusion, right ([place])   . Failure of surgically constructed arteriovenous fistula ([name]) 10/03/2020  . Myoclonus 08/31/2020  . Clotted renal dialysis AV graft, initial encounter ([place])   . Hypertensive heart and chronic kidney disease with heart failure and stage 1 through stage 4 chronic kidney disease, or chronic kidney disease ([place])   . Hemodialysis-associated hypotension   . Acute hypoxemic respiratory failure ([place]) 06/14/2020  . Hypertension 06/12/2020  . Irritable bowel syndrome 02/25/2020  . Adenomatous duodenal polyp 09/10/2019  . History of GI bleed 09/10/2019  . Angina pectoris ([place]) 06/05/2019  . Chest pain 06/03/2019  . Small intestinal bacterial overgrowth 05/14/2019  . Iron deficiency anemia 04/02/2019  . GI bleed 03/08/2019  . Gastrointestinal hemorrhage with melena 03/06/2019  . Acute respiratory failure with hypoxia ([place]) 12/25/2018  . Elevated troponin 12/14/2018  . Chest pain at rest 07/13/2018  . Hand steal syndrome (  [place]) 08/01/2017  . Anemia 07/14/2017  . Coronary artery disease 06/05/2017  . Mesenteric ischemia ([place])   . Diverticulitis   . Enteritis   . Complication of vascular access for dialysis 03/19/2017  . Preoperative clearance 01/25/2017  . H/O non-ST elevation myocardial infarction (NSTEMI) 10/24/2016  . Fluid overload 10/10/2016  . Complication from renal dialysis device 10/10/2016  . Non-ST elevation MI (NSTEMI) ([place])   . Encounter for fitting and adjustment of vascular catheter   . Heme positive stool   . Demand ischemia ([place]) 07/27/2016  . Hypertensive emergency 07/08/2016  . Acute on chronic respiratory  failure with hypoxia ([place])   . Cardiac arrest ([place])   . Palliative care encounter   . Goals of care, counseling/discussion   . Hypertensive crisis without congestive heart failure 05/09/2016  . Flash pulmonary edema ([place]) 04/06/2016  . Acute respiratory failure ([name]) 04/06/2016  . Hypertensive crisis 01/27/2016  . History of colon cancer 01/27/2016  . History of ovarian cancer 01/27/2016  . Hypertensive urgency 01/27/2016  . Paroxysmal atrial fibrillation ([place]) 10/14/2015  . Coronary angioplasty status 10/14/2015  . Malignant neoplasm of right ovary ([place]) 10/14/2015  . Narrow complex tachycardia ([place]) 09/08/2015  . SVT (supraventricular tachycardia) ([place]) 09/08/2015  . Influenza A 08/30/2015  . Acute on chronic diastolic CHF (congestive heart failure) ([place]) 05/04/2015  . Unstable angina ([place]) 05/03/2015  . DOE (dyspnea on exertion)   . Essential hypertension   . Pain in joint, lower leg 08/14/2014  . Dacryocystitis 05/29/2013  . Chronic diastolic CHF (congestive heart failure) ([place]) 03/22/2013  . GI bleeding 03/21/2013  . Acute blood loss anemia 03/21/2013  . Occlusion and stenosis of carotid artery without mention of cerebral infarction 01/24/2013  . Hx of CABG 07/05/2012  . Carotid artery disease ([place]) 07/05/2012  . Mitral regurgitation 06/12/2012  . Pneumonia 06/09/2012  . Non-STEMI (non-ST elevated myocardial infarction) ([place]) 06/08/2012  . Ischemic chest pain ([place]) 03/01/2012  . 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 GERD (gastroesophageal reflux disease) 01/09/2012  . HLD (hyperlipidemia) 01/05/2012  . Atherosclerotic heart disease of native coronary artery without angina pectoris 12/16/2011  . Essential hypertension, benign 12/16/2011  . ESRD on hemodialysis ([place]) 12/16/2011  . Anxiety disorder 05/04/2011  . Anemia in chronic kidney disease 04/29/2011  . ESRD (end stage renal disease) on dialysis ([place]) 04/29/2011  . Gout 04/29/2011  .  Hypertensive chronic kidney disease with stage 5 chronic kidney disease or end stage renal disease ([place]) 04/29/2011    Past Surgical History:  Procedure Laterality Date  . A/V SHUNTOGRAM Left 03/19/2019   Procedure: A/V SHUNTOGRAM;  Surgeon: Katha Cabal, MD;  Location: [facility] CV LAB;  Service: Cardiovascular;  Laterality: Left;  . ABDOMINAL HYSTERECTOMY  1992  . APPENDECTOMY  06/1990  . AV FISTULA PLACEMENT  07/2009   left upper arm  . AV FISTULA PLACEMENT Right 09/06/2016   Procedure: RIGHT FOREARM ARTERIOVENOUS (AV) GRAFT;  Surgeon: Elam Dutch, MD;  Location: [facility] OR;  Service: Vascular;  Laterality: Right;  . AV FISTULA PLACEMENT N/A 02/24/2017   Procedure: INSERTION OF ARTERIOVENOUS (AV) GORE-TEX GRAFT ARM (BRACHIAL AXILLARY);  Surgeon: Katha Cabal, MD;  Location: [facility] ORS;  Service: Vascular;  Laterality: N/A;  . [place] Right 09/06/2016   Procedure: REMOVAL OF Right Arm ARTERIOVENOUS GORETEX GRAFT and Vein Patch angioplasty of brachial artery;  Surgeon: Angelia Mould, MD;  Location: [facility];  Service: Vascular;  Laterality: Right;  . BIOPSY  09/26/2019   Procedure: BIOPSY;  Surgeon: Rogene Houston, MD;  [facility];  Service: Endoscopy;;  . COLON RESECTION  1992  . COLON SURGERY    . COLONOSCOPY N/A 03/09/2019   Procedure: COLONOSCOPY;  Surgeon: Rogene Houston, MD;  [facility];  Service: Endoscopy;  Laterality: N/A;  . CORONARY ANGIOPLASTY WITH STENT PLACEMENT  12/15/11   "2"  . CORONARY ANGIOPLASTY WITH STENT PLACEMENT  y/2013   "1; makes total of 3" (05/02/2012)  . CORONARY ARTERY BYPASS GRAFT  06/13/2012   Procedure: CORONARY ARTERY BYPASS GRAFTING (CABG);  Surgeon: Grace Isaac, MD;  Location: [facility];  Service: Open Heart Surgery;  Laterality: N/A;  cabg x four;  using left internal mammary artery, and left leg greater saphenous vein harvested endoscopically  . CORONARY STENT INTERVENTION N/A 10/13/2016   Procedure:  Coronary Stent Intervention;  Surgeon: Troy Sine, MD;  Location: [facility] CV LAB;  Service: Cardiovascular;  Laterality: N/A;  . DIALYSIS/PERMA CATHETER REMOVAL N/A 04/18/2017   Procedure: DIALYSIS/PERMA CATHETER REMOVAL;  Surgeon: Katha Cabal, MD;  Location: [facility] CV LAB;  Service: Cardiovascular;  Laterality: N/A;  . DILATION AND CURETTAGE OF UTERUS    . ESOPHAGOGASTRODUODENOSCOPY  01/20/2012   Procedure: ESOPHAGOGASTRODUODENOSCOPY (EGD);  Surgeon: Ladene Artist, MD,FACG;  Location: [facility] ENDOSCOPY;  Service: Endoscopy;  Laterality: N/A;  . ESOPHAGOGASTRODUODENOSCOPY N/A 03/26/2013   Procedure: ESOPHAGOGASTRODUODENOSCOPY (EGD);  Surgeon: Irene Shipper, MD;  Location: [facility] ENDOSCOPY;  Service: Endoscopy;  Laterality: N/A;  . ESOPHAGOGASTRODUODENOSCOPY N/A 04/30/2015   Procedure: ESOPHAGOGASTRODUODENOSCOPY (EGD);  Surgeon: Rogene Houston, MD;  [facility];  Service: Endoscopy;  Laterality: N/A;  1pm - moved to 10/20 @ 1:10  . ESOPHAGOGASTRODUODENOSCOPY N/A 07/29/2016   Procedure: ESOPHAGOGASTRODUODENOSCOPY (EGD);  Surgeon: Manus Gunning, MD;  Location: [facility];  Service: Gastroenterology;  Laterality: N/A;  enteroscopy  . ESOPHAGOGASTRODUODENOSCOPY N/A 09/26/2019   Procedure: ESOPHAGOGASTRODUODENOSCOPY (EGD);  Surgeon: Rogene Houston, MD;  [facility];  Service: Endoscopy;  Laterality: N/A;  1250  . GIVENS CAPSULE STUDY N/A 03/07/2019   Procedure: GIVENS CAPSULE STUDY;  Surgeon: Rogene Houston, MD;  [facility];  Service: Endoscopy;  Laterality: N/A;  7:30  . INSERTION OF DIALYSIS CATHETER N/A 10/05/2020   Procedure: ABORTED TUNNELED DIALYSIS CATHETER PLACEMENT RIGHT INTERNAL JUGULAR VEIN ;  Surgeon: Virl Cagey, MD;  Location: [facility];  Service: General;  Laterality: N/A;  . INTRAOPERATIVE TRANSESOPHAGEAL ECHOCARDIOGRAM  06/13/2012   Procedure: INTRAOPERATIVE TRANSESOPHAGEAL ECHOCARDIOGRAM;  Surgeon: Grace Isaac, MD;   Location: [facility];  Service: Open Heart Surgery;  Laterality: N/A;  . IR DIALY SHUNT INTRO NEEDLE/INTRACATH INITIAL W/IMG LEFT Left 10/06/2020  . IR FLUORO GUIDE CV LINE RIGHT  06/17/2020  . IR GENERIC HISTORICAL  07/26/2016   IR FLUORO GUIDE CV LINE RIGHT 07/26/2016 Greggory Keen, MD [facility]INTER[facility]  . IR GENERIC HISTORICAL  07/26/2016   IR US GUIDE VASC ACCESS RIGHT 07/26/2016 Greggory Keen, MD [facility]  . IR GENERIC HISTORICAL  08/02/2016   IR US GUIDE VASC ACCESS RIGHT 08/02/2016 Greggory Keen, MD [facility]  . IR GENERIC HISTORICAL  08/02/2016   IR FLUORO GUIDE CV LINE RIGHT 08/02/2016 Greggory Keen, MD [facility]  . IR RADIOLOGY PERIPHERAL GUIDED IV START  03/28/2017  . IR REMOVAL TUN CV CATH W/O FL  08/11/2020  . IR THROMBECTOMY AV FISTULA W/THROMBOLYSIS INC/SHUNT/IMG LEFT Left 06/17/2020  . IR US GUIDE VASC ACCESS  LEFT  06/17/2020  . IR US GUIDE VASC ACCESS RIGHT  03/28/2017  . IR US GUIDE VASC ACCESS RIGHT  06/17/2020  . LEFT HEART CATH AND CORONARY ANGIOGRAPHY N/A 09/20/2016   Procedure: Left Heart Cath and Coronary Angiography;  Surgeon: [name], MD;  Location: [facility] CV LAB;  Service: Cardiovascular;  Laterality: N/A;  . LEFT HEART CATH AND CORS/GRAFTS ANGIOGRAPHY N/A 10/13/2016   Procedure: Left Heart Cath and Cors/Grafts Angiography;  Surgeon: Troy Sine, MD;  Location: [facility] CV LAB;  Service: Cardiovascular;  Laterality: N/A;  . LEFT HEART CATH AND CORS/GRAFTS ANGIOGRAPHY N/A 07/13/2018   Procedure: LEFT HEART CATH AND CORS/GRAFTS ANGIOGRAPHY;  Surgeon: Martinique, Peter M, MD;  Location: [facility] CV LAB;  Service: Cardiovascular;  Laterality: N/A;  . LEFT HEART CATHETERIZATION WITH CORONARY ANGIOGRAM N/A 12/15/2011   Procedure: LEFT HEART CATHETERIZATION WITH CORONARY ANGIOGRAM;  Surgeon: Burnell Blanks, MD;  Location: [facility] CATH LAB;  Service: Cardiovascular;  Laterality: N/A;  . LEFT HEART CATHETERIZATION WITH CORONARY ANGIOGRAM N/A 01/10/2012   Procedure: LEFT HEART  CATHETERIZATION WITH CORONARY ANGIOGRAM;  Surgeon: Peter M Martinique, MD;  Location: [facility] CATH LAB;  Service: Cardiovascular;  Laterality: N/A;  . LEFT HEART CATHETERIZATION WITH CORONARY ANGIOGRAM N/A 06/08/2012   Procedure: LEFT HEART CATHETERIZATION WITH CORONARY ANGIOGRAM;  Surgeon: Burnell Blanks, MD;  Location: [facility] CATH LAB;  Service: Cardiovascular;  Laterality: N/A;  . LEFT HEART CATHETERIZATION WITH CORONARY/GRAFT ANGIOGRAM N/A 12/10/2013   Procedure: LEFT HEART CATHETERIZATION WITH Beatrix Fetters;  Surgeon: [name], MD;  Location: [facility] CATH LAB;  Service: Cardiovascular;  Laterality: N/A;  . OVARY SURGERY     ovarian cancer  . POLYPECTOMY  03/09/2019   Procedure: POLYPECTOMY;  Surgeon: Rogene Houston, MD;  [facility];  Service: Endoscopy;;  cecal   . POLYPECTOMY N/A 09/26/2019   Procedure: DUODENAL POLYPECTOMY;  Surgeon: Rogene Houston, MD;  [facility];  Service: Endoscopy;  Laterality: N/A;  . REVISION OF ARTERIOVENOUS GORETEX GRAFT N/A 02/24/2017   Procedure: REVISION OF ARTERIOVENOUS GORETEX GRAFT (RESECTION);  Surgeon: Katha Cabal, MD;  Location: [facility] ORS;  Service: Vascular;  Laterality: N/A;  . REVISON OF ARTERIOVENOUS FISTULA Left 06/19/2020   Procedure: REVISION OF LEFT UPPER ARM AV GRAFT WITH INTERPOSITION JUMP GRAFT USING 6MM GORE LIMB;  Surgeon: Marty Heck, MD;  Location: [facility];  Service: Vascular;  Laterality: Left;  . SHUNTOGRAM N/A 10/15/2013   Procedure: Fistulogram;  Surgeon: Serafina Mitchell, MD;  Location: [facility] CATH LAB;  Service: Cardiovascular;  Laterality: N/A;  . THROMBECTOMY / ARTERIOVENOUS GRAFT REVISION  2011   left upper arm  . TUBAL LIGATION  1980's  . UPPER EXTREMITY ANGIOGRAPHY Bilateral 12/06/2016   Procedure: Upper Extremity Angiography;  Surgeon: Katha Cabal, MD;  Location: [place] [facility] LAB;  Service: Cardiovascular;  Laterality: Bilateral;  . UPPER EXTREMITY INTERVENTION Left  06/06/2017   Procedure: UPPER EXTREMITY INTERVENTION;  Surgeon: Katha Cabal, MD;  Location: [place] [facility] LAB;  Service: Cardiovascular;  Laterality: Left;     OB History    Gravida  2   Para  2   Term      Preterm  2   AB      Living  2     SAB      IAB      Ectopic      Multiple      Live Births  Family History  Problem Relation Age of Onset  . Heart disease Mother        Heart Disease before age 54  . Hyperlipidemia Mother   . Hypertension Mother   . Diabetes Mother   . Heart attack Mother   . Heart disease Father        Heart Disease before age 66  . Hyperlipidemia Father   . Hypertension Father   . Diabetes Father   . Diabetes Sister   . Hypertension Sister   . Diabetes Brother   . Hyperlipidemia Brother   . Heart attack Brother   . Hypertension Sister   . Heart attack Brother   . Colon cancer Child 66  . Other Other        noncontributory for early CAD  . Esophageal cancer Neg Hx   . Liver disease Neg Hx   . Kidney disease Neg Hx   . Colon polyps Neg Hx     Social History   Tobacco Use  . Smoking status: Never Smoker  . Smokeless tobacco: Never Used  Vaping Use  . Vaping Use: Never used  Substance Use Topics  . Alcohol use: No    Alcohol/week: 0.0 standard drinks  . Drug use: No    Home Medications Prior to Admission medications   Medication Sig Start Date End Date Taking? Authorizing Provider  amiodarone (PACERONE) 200 MG tablet Take 1 tablet (200 mg total) by mouth daily. Starting July 2 12/17/19   Ahmed Prima Fransisco Hertz, PA-C  aspirin EC 81 MG tablet Take 81 mg by mouth daily.  09/27/19   Rehman, Mechele Dawley, MD  fluticasone (FLONASE) 50 MCG/ACT nasal spray Place 1 spray into both nostrils at bedtime as needed for allergies.     [provider]  isosorbide mononitrate (IMDUR) 60 MG 24 hr tablet Take 1 tablet (60 mg total) by mouth in the morning and at bedtime. 10/20/20 01/18/21  Erma Heritage, PA-C   lidocaine-prilocaine (EMLA) cream Apply 1 application topically every Monday, Wednesday, and Friday. Prior to dialysis    [provider]  loperamide (IMODIUM A-D) 2 MG tablet Take 1 tablet (2 mg total) by mouth daily before breakfast. 12/01/20   Rehman, Mechele Dawley, MD  loratadine (CLARITIN) 10 MG tablet Take 1 tablet (10 mg total) by mouth daily as needed for allergies. 07/14/18   Hosie Poisson, MD  multivitamin (RENA-VIT) TABS tablet Take 1 tablet by mouth daily.    [provider]  nitroGLYCERIN (NITROSTAT) 0.4 MG SL tablet Place 1 tablet (0.4 mg total) under the tongue every 5 (five) minutes x 3 doses as needed for chest pain (if no relief after 2nd dose, proceed to the ED for an evaluation or call 911). 09/08/20   Verta Ellen., NP  omeprazole (PRILOSEC) 20 MG capsule Take 1 capsule (20 mg total) by mouth daily. 12/01/20   Rogene Houston, MD  sevelamer carbonate (RENVELA) 800 MG tablet Take 2 tablets (1,600 mg total) by mouth 3 (three) times daily with meals. 06/20/20   Meccariello, Bernita Raisin, DO  simvastatin (ZOCOR) 20 MG tablet Take 1 tablet (20 mg total) by mouth at bedtime. 07/07/20   Verta Ellen., NP    Allergies    Aspirin, Penicillins, Amlodipine, Bactrim [sulfamethoxazole-trimethoprim], Contrast media [iodinated diagnostic agents], Iron, Nitrofurantoin, Tylenol [acetaminophen], Gabapentin, Hydralazine, Levofloxacin, No known allergies, Ranexa [ranolazine], Dexilant [dexlansoprazole], Levaquin [levofloxacin in d5w], Morphine and related, Plavix [clopidogrel bisulfate], Protonix [pantoprazole sodium], and Venofer [ferric oxide]  Review of Systems   Review of Systems  All other systems reviewed and are negative.   Physical Exam Updated Vital Signs BP (!) 166/36   Pulse (!) 58   Resp 16   SpO2 100%   Physical Exam Vitals and nursing note reviewed.  Constitutional:      General: She is not in acute distress.    Appearance: Normal appearance. She is  well-developed. She is not toxic-appearing.  HENT:     Head: Normocephalic and atraumatic.  Eyes:     General: Lids are normal.     Conjunctiva/sclera: Conjunctivae normal.     Pupils: Pupils are equal, round, and reactive to light.  Neck:     Thyroid: No thyroid mass.     Trachea: No tracheal deviation.  Cardiovascular:     Rate and Rhythm: Normal rate and regular rhythm.     Heart sounds: Normal heart sounds. No murmur heard. No gallop.   Pulmonary:     Effort: Pulmonary effort is normal. No respiratory distress.     Breath sounds: Normal breath sounds. No stridor. No decreased breath sounds, wheezing, rhonchi or rales.  Abdominal:     General: Bowel sounds are normal. There is no distension.     Palpations: Abdomen is soft.     Tenderness: There is no abdominal tenderness. There is no rebound.  Musculoskeletal:        General: No tenderness. Normal range of motion.     Cervical back: Normal range of motion and neck supple.  Skin:    General: Skin is warm and dry.     Findings: No abrasion or rash.  Neurological:     Mental Status: She is alert and oriented to person, place, and time.     GCS: GCS eye subscore is 4. GCS verbal subscore is 5. GCS motor subscore is 6.     Cranial Nerves: No cranial nerve deficit.     Sensory: No sensory deficit.     Gait: Gait abnormal.     Comments: Strength is 5/5 throughout  Psychiatric:        Attention and Perception: Attention normal.     ED Results / Procedures / Treatments   Labs (all labs ordered are listed, but only abnormal results are displayed) Labs Reviewed  CBC WITH DIFFERENTIAL/PLATELET - Abnormal; Notable for the following components:      Result Value   RBC 2.90 (*)    Hemoglobin 10.6 (*)    HCT 35.0 (*)    MCV 120.7 (*)    MCH 36.6 (*)    RDW 17.1 (*)    Platelets 135 (*)    All other components within normal limits  COMPREHENSIVE METABOLIC PANEL  COMPREHENSIVE METABOLIC PANEL    EKG EKG  Interpretation  Date/Time:  Wednesday Dec 02 2020 17:38:30 EDT Ventricular Rate:  62 PR Interval:  178 QRS Duration: 134 QT Interval:  562 QTC Calculation: 570 R Axis:   17 Text Interpretation: Normal sinus rhythm Left bundle branch block Abnormal ECG No significant change since last tracing Confirmed by Lacretia Leigh ([id number]) on 12/02/2020 5:50:17 PM   Radiology CT Head Wo Contrast  Result Date: 12/02/2020 CLINICAL DATA:  80 year old female with head trauma. EXAM: CT HEAD WITHOUT CONTRAST TECHNIQUE: Contiguous axial images were obtained from the base of the skull through the vertex without intravenous contrast. COMPARISON:  Head CT dated 11/09/2020. FINDINGS: Brain: Moderate age-related atrophy and chronic microvascular ischemic changes. There is no acute intracranial hemorrhage. No  mass effect or midline shift. No extra-axial fluid collection. Vascular: No hyperdense vessel or unexpected calcification. Skull: Normal. Negative for fracture or focal lesion. Sinuses/Orbits: Small retention cyst or polyp in the left sphenoid sinus. The remainder of the visualized paranasal sinuses and mastoid air cells are clear. No air-fluid level. Partially visualized cystic area in the right maxilla at the right maxillary central incisor root. This may represent an area of periapical lucency or a dentigerous cyst. Correlation with dental exam recommended. Other: None IMPRESSION: 1. No acute intracranial pathology. 2. Moderate age-related atrophy and chronic microvascular ischemic changes. Electronically Signed   By: Anner Crete M.D.   On: 12/02/2020 19:25    Procedures Procedures   Medications Ordered in ED Medications - No data to display  ED Course  I have reviewed the triage vital signs and the nursing notes.  Pertinent labs & imaging results that were available during my care of the patient were reviewed by me and considered in my medical decision making (see chart for details).    MDM  Rules/Calculators/A&P                          Attempted to walk patient unsuccessfully here.  Patient has 5 of 5 strength in her lower extremities.  Discussed case with neurology shows that amiodarone could be the cause of her symptoms.  We will see patient in consultation but recommends admission for further evaluation of her ataxia Final Clinical Impression(s) / ED Diagnoses Final diagnoses:  None    Rx / DC Orders ED Discharge Orders    None       Lacretia Leigh, MD 12/02/20 2316

## 2020-12-02 NOTE — Progress Notes (Deleted)
Cardiology Office Note  Date: 12/02/2020   ID: Shawna [name], DOB 10-Sep-1940, MRN [id number]  PCP:  [facility]  Cardiologist:  None Electrophysiologist:  None   Chief Complaint: Follow up  History of Present Illness: Shawna [name] is a 80 y.o. female with a history of   Past Medical History:  Diagnosis Date  . Acute on chronic respiratory failure with hypoxia ([place]) 10/10/2016  . Anxiety   . Arthritis   . AVM (arteriovenous malformation) of colon   . CAD (coronary artery disease)    a. s/p CABG in 2013 b. DES to D1 in 10/2016. c. cath in 07/2018 showing patent grafts with occlusion of D1 at prior stent site and progression of PDA disease --> medical management recommended  . Carotid artery disease ([place])    a. 97-98% LICA, 03/2118   . Chronic anemia   . Chronic bronchitis ([place])   . Chronic diastolic CHF (congestive heart failure) ([place])    a. 02/2012 Echo EF 60-65%, nl wall motion, Gr 1 DD, mod MR  . Colon cancer ([name]) 1992  . Esophageal stricture   . ESRD on hemodialysis ([place])    ESRD due to HTN, started dialysis 2011 and gets HD at [facility] with Dr Hinda Lenis on MWF schedule.  Access is LUA AVF as of Sept 2014.   Marland Kitchen GERD (gastroesophageal reflux disease)   . High cholesterol 12/2011  . History of blood transfusion 07/2011; 12/2011; 01/2012 X 2; 04/2012  . History of gout   . History of lower GI bleeding   . Hypertension   . Iron deficiency anemia   . Mitral regurgitation    a. Moderate by echo, 02/2012  . Myocardial infarction ([place])   . NSVT (nonsustained ventricular tachycardia) ([place])   . Ovarian cancer ([place]) 1992  . PAF (paroxysmal atrial fibrillation) ([place])   . Pneumonia ~ 2009  . PUD (peptic ulcer disease)   . TIA (transient ischemic attack)     Past Surgical History:  Procedure Laterality Date  . A/V SHUNTOGRAM Left 03/19/2019   Procedure: A/V SHUNTOGRAM;  Surgeon: Katha Cabal, MD;  Location: [place] [facility];  Service: Cardiovascular;   Laterality: Left;  . ABDOMINAL HYSTERECTOMY  1992  . APPENDECTOMY  06/1990  . AV FISTULA PLACEMENT  07/2009   left upper arm  . AV FISTULA PLACEMENT Right 09/06/2016   Procedure: RIGHT FOREARM ARTERIOVENOUS (AV) GRAFT;  Surgeon: Elam Dutch, MD;  Location: [facility];  Service: Vascular;  Laterality: Right;  . AV FISTULA PLACEMENT N/A 02/24/2017   Procedure: INSERTION OF ARTERIOVENOUS (AV) GORE-TEX GRAFT ARM (BRACHIAL AXILLARY);  Surgeon: Katha Cabal, MD;  Location: [facility];  Service: Vascular;  Laterality: N/A;  . [place] Right 09/06/2016   Procedure: REMOVAL OF Right Arm ARTERIOVENOUS GORETEX GRAFT and Vein Patch angioplasty of brachial artery;  Surgeon: Angelia Mould, MD;  Location: [place];  Service: Vascular;  Laterality: Right;  . BIOPSY  09/26/2019   Procedure: BIOPSY;  Surgeon: Rogene Houston, MD;  [facility];  Service: Endoscopy;;  . COLON RESECTION  1992  . COLON SURGERY    . COLONOSCOPY N/A 03/09/2019   Procedure: COLONOSCOPY;  Surgeon: Rogene Houston, MD;  [facility];  Service: Endoscopy;  Laterality: N/A;  . CORONARY ANGIOPLASTY WITH STENT PLACEMENT  12/15/11   "2"  . CORONARY ANGIOPLASTY WITH STENT PLACEMENT  y/2013   "1; makes total of 3" (05/02/2012)  . CORONARY  ARTERY BYPASS GRAFT  06/13/2012   Procedure: CORONARY ARTERY BYPASS GRAFTING (CABG);  Surgeon: Grace Isaac, MD;  Location: [place];  Service: Open Heart Surgery;  Laterality: N/A;  cabg x four;  using left internal mammary artery, and left leg greater saphenous vein harvested endoscopically  . CORONARY STENT INTERVENTION N/A 10/13/2016   Procedure: Coronary Stent Intervention;  Surgeon: Troy Sine, MD;  Location: [place] [facility];  Service: Cardiovascular;  Laterality: N/A;  . DIALYSIS/PERMA CATHETER REMOVAL N/A 04/18/2017   Procedure: DIALYSIS/PERMA CATHETER REMOVAL;  Surgeon: Katha Cabal, MD;  Location: [place] [facility];  Service: Cardiovascular;   Laterality: N/A;  . DILATION AND CURETTAGE OF UTERUS    . ESOPHAGOGASTRODUODENOSCOPY  01/20/2012   Procedure: ESOPHAGOGASTRODUODENOSCOPY (EGD);  Surgeon: Ladene Artist, MD,FACG;  Location: [facility];  Service: Endoscopy;  Laterality: N/A;  . ESOPHAGOGASTRODUODENOSCOPY N/A 03/26/2013   Procedure: ESOPHAGOGASTRODUODENOSCOPY (EGD);  Surgeon: Irene Shipper, MD;  Location: [facility];  Service: Endoscopy;  Laterality: N/A;  . ESOPHAGOGASTRODUODENOSCOPY N/A 04/30/2015   Procedure: ESOPHAGOGASTRODUODENOSCOPY (EGD);  Surgeon: Rogene Houston, MD;  [facility];  Service: Endoscopy;  Laterality: N/A;  1pm - moved to 10/20 @ 1:10  . ESOPHAGOGASTRODUODENOSCOPY N/A 07/29/2016   Procedure: ESOPHAGOGASTRODUODENOSCOPY (EGD);  Surgeon: Manus Gunning, MD;  Location: [place];  Service: Gastroenterology;  Laterality: N/A;  enteroscopy  . ESOPHAGOGASTRODUODENOSCOPY N/A 09/26/2019   Procedure: ESOPHAGOGASTRODUODENOSCOPY (EGD);  Surgeon: Rogene Houston, MD;  [facility];  Service: Endoscopy;  Laterality: N/A;  1250  . GIVENS CAPSULE STUDY N/A 03/07/2019   Procedure: GIVENS CAPSULE STUDY;  Surgeon: Rogene Houston, MD;  [facility];  Service: Endoscopy;  Laterality: N/A;  7:30  . INSERTION OF DIALYSIS CATHETER N/A 10/05/2020   Procedure: ABORTED TUNNELED DIALYSIS CATHETER PLACEMENT RIGHT INTERNAL JUGULAR VEIN ;  Surgeon: Virl Cagey, MD;  [facility];  Service: General;  Laterality: N/A;  . INTRAOPERATIVE TRANSESOPHAGEAL ECHOCARDIOGRAM  06/13/2012   Procedure: INTRAOPERATIVE TRANSESOPHAGEAL ECHOCARDIOGRAM;  Surgeon: Grace Isaac, MD;  Location: [place];  Service: Open Heart Surgery;  Laterality: N/A;  . IR DIALY SHUNT INTRO NEEDLE/INTRACATH INITIAL W/IMG LEFT Left 10/06/2020  . IR FLUORO GUIDE CV LINE RIGHT  06/17/2020  . IR GENERIC HISTORICAL  07/26/2016   IR FLUORO GUIDE CV LINE RIGHT 07/26/2016 Greggory Keen, MD [facility]  . IR GENERIC HISTORICAL   07/26/2016   IR US GUIDE VASC ACCESS RIGHT 07/26/2016 Greggory Keen, MD [facility]  . IR GENERIC HISTORICAL  08/02/2016   IR US GUIDE VASC ACCESS RIGHT 08/02/2016 Greggory Keen, MD [facility]  . IR GENERIC HISTORICAL  08/02/2016   IR FLUORO GUIDE CV LINE RIGHT 08/02/2016 Greggory Keen, MD [facility]  . IR RADIOLOGY PERIPHERAL GUIDED IV START  03/28/2017  . IR REMOVAL TUN CV CATH W/O FL  08/11/2020  . IR THROMBECTOMY AV FISTULA W/THROMBOLYSIS INC/SHUNT/IMG LEFT Left 06/17/2020  . IR US GUIDE VASC ACCESS LEFT  06/17/2020  . IR US GUIDE VASC ACCESS RIGHT  03/28/2017  . IR US GUIDE VASC ACCESS RIGHT  06/17/2020  . LEFT HEART CATH AND CORONARY ANGIOGRAPHY N/A 09/20/2016   Procedure: Left Heart Cath and Coronary Angiography;  Surgeon: [name], MD;  Location: [place] [facility];  Service: Cardiovascular;  Laterality: N/A;  . LEFT HEART CATH AND CORS/GRAFTS ANGIOGRAPHY N/A 10/13/2016   Procedure: Left Heart Cath and Cors/Grafts Angiography;  Surgeon: Troy Sine, MD;  Location: [place]  [facility];  Service: Cardiovascular;  Laterality: N/A;  . LEFT HEART CATH AND CORS/GRAFTS ANGIOGRAPHY N/A 07/13/2018   Procedure: LEFT HEART CATH AND CORS/GRAFTS ANGIOGRAPHY;  Surgeon: Martinique, Peter M, MD;  Location: [place] [facility];  Service: Cardiovascular;  Laterality: N/A;  . LEFT HEART CATHETERIZATION WITH CORONARY ANGIOGRAM N/A 12/15/2011   Procedure: LEFT HEART CATHETERIZATION WITH CORONARY ANGIOGRAM;  Surgeon: Burnell Blanks, MD;  Location: [facility] C[facility] LAB;  Service: Cardiovascular;  Laterality: N/A;  . LEFT HEART CATHETERIZATION WITH CORONARY ANGIOGRAM N/A 01/10/2012   Procedure: LEFT HEART CATHETERIZATION WITH CORONARY ANGIOGRAM;  Surgeon: Peter M Martinique, MD;  Location: [facility] CATH LAB;  Service: Cardiovascular;  Laterality: N/A;  . LEFT HEART CATHETERIZATION WITH CORONARY ANGIOGRAM N/A 06/08/2012   Procedure: LEFT HEART CATHETERIZATION WITH CORONARY ANGIOGRAM;  Surgeon: Burnell Blanks, MD;  Location:  [facility] CATH LAB;  Service: Cardiovascular;  Laterality: N/A;  . LEFT HEART CATHETERIZATION WITH CORONARY/GRAFT ANGIOGRAM N/A 12/10/2013   Procedure: LEFT HEART CATHETERIZATION WITH Beatrix Fetters;  Surgeon: [name], MD;  Location: [facility] CATH LAB;  Service: Cardiovascular;  Laterality: N/A;  . OVARY SURGERY     ovarian cancer  . POLYPECTOMY  03/09/2019   Procedure: POLYPECTOMY;  Surgeon: Rogene Houston, MD;  [facility];  Service: Endoscopy;;  cecal   . POLYPECTOMY N/A 09/26/2019   Procedure: DUODENAL POLYPECTOMY;  Surgeon: Rogene Houston, MD;  [facility];  Service: Endoscopy;  Laterality: N/A;  . REVISION OF ARTERIOVENOUS GORETEX GRAFT N/A 02/24/2017   Procedure: REVISION OF ARTERIOVENOUS GORETEX GRAFT (RESECTION);  Surgeon: Katha Cabal, MD;  Location: [facility];  Service: Vascular;  Laterality: N/A;  . REVISON OF ARTERIOVENOUS FISTULA Left 06/19/2020   Procedure: REVISION OF LEFT UPPER ARM AV GRAFT WITH INTERPOSITION JUMP GRAFT USING 6MM GORE LIMB;  Surgeon: Marty Heck, MD;  Location: [place];  Service: Vascular;  Laterality: Left;  . SHUNTOGRAM N/A 10/15/2013   Procedure: Fistulogram;  Surgeon: Serafina Mitchell, MD;  Location: [facility] CATH LAB;  Service: Cardiovascular;  Laterality: N/A;  . THROMBECTOMY / ARTERIOVENOUS GRAFT REVISION  2011   left upper arm  . TUBAL LIGATION  1980's  . UPPER EXTREMITY ANGIOGRAPHY Bilateral 12/06/2016   Procedure: Upper Extremity Angiography;  Surgeon: Katha Cabal, MD;  Location: [place] [facility];  Service: Cardiovascular;  Laterality: Bilateral;  . UPPER EXTREMITY INTERVENTION Left 06/06/2017   Procedure: UPPER EXTREMITY INTERVENTION;  Surgeon: Katha Cabal, MD;  Location: [place] [facility];  Service: Cardiovascular;  Laterality: Left;    Current Outpatient Medications  Medication Sig Dispense Refill  . amiodarone (PACERONE) 200 MG tablet Take 1 tablet (200 mg total) by mouth daily. Starting  July 2 90 tablet 3  . aspirin EC 81 MG tablet Take 81 mg by mouth daily.     . fluticasone (FLONASE) 50 MCG/ACT nasal spray Place 1 spray into both nostrils at bedtime as needed for allergies.     . isosorbide mononitrate (IMDUR) 60 MG 24 hr tablet Take 1 tablet (60 mg total) by mouth in the morning and at bedtime. 180 tablet 3  . lidocaine-prilocaine (EMLA) cream Apply 1 application topically every Monday, Wednesday, and Friday. Prior to dialysis    . loperamide (IMODIUM A-D) 2 MG tablet Take 1 tablet (2 mg total) by mouth daily before breakfast. 30 tablet 0  . loratadine (CLARITIN) 10 MG tablet Take 1 tablet (10 mg total) by mouth daily as needed for allergies. 30 tablet  0  . multivitamin (RENA-VIT) TABS tablet Take 1 tablet by mouth daily.    . nitroGLYCERIN (NITROSTAT) 0.4 MG SL tablet Place 1 tablet (0.4 mg total) under the tongue every 5 (five) minutes x 3 doses as needed for chest pain (if no relief after 2nd dose, proceed to the ED for an evaluation or call 911). 25 tablet 2  . omeprazole (PRILOSEC) 20 MG capsule Take 1 capsule (20 mg total) by mouth daily. 90 capsule 3  . sevelamer carbonate (RENVELA) 800 MG tablet Take 2 tablets (1,600 mg total) by mouth 3 (three) times daily with meals. 180 tablet 1  . simvastatin (ZOCOR) 20 MG tablet Take 1 tablet (20 mg total) by mouth at bedtime. 90 tablet 3   No current facility-administered medications for this visit.   Allergies:  Aspirin, Penicillins, Amlodipine, Bactrim [sulfamethoxazole-trimethoprim], Contrast media [iodinated diagnostic agents], Iron, Nitrofurantoin, Tylenol [acetaminophen], Gabapentin, Hydralazine, Levofloxacin, No known allergies, Ranexa [ranolazine], Dexilant [dexlansoprazole], Levaquin [levofloxacin in d5w], Morphine and related, Plavix [clopidogrel bisulfate], Protonix [pantoprazole sodium], and Venofer [ferric oxide]   Social History: The patient  reports that she has never smoked. She has never used smokeless tobacco. She  reports that she does not drink alcohol and does not use drugs.   Family History: The patient's family history includes Colon cancer (age of onset: 48) in her child; Diabetes in her brother, father, mother, and sister; Heart attack in her brother, brother, and mother; Heart disease in her father and mother; Hyperlipidemia in her brother, father, and mother; Hypertension in her father, mother, sister, and sister; Other in an other family member.   ROS:  Please see the history of present illness. Otherwise, complete review of systems is positive for {NONE DEFAULTED:18576::"none"}.  All other systems are reviewed and negative.   Physical Exam: VS:  There were no vitals taken for this visit., BMI There is no height or weight on file to calculate BMI.  Wt Readings from Last 3 Encounters:  12/01/20 132 lb 12.8 oz (60.2 kg)  11/10/20 138 lb 7.2 oz (62.8 kg)  11/05/20 137 lb (62.1 kg)    General: Patient appears comfortable at rest. HEENT: Conjunctiva and lids normal, oropharynx clear with moist mucosa. Neck: Supple, no elevated JVP or carotid bruits, no thyromegaly. Lungs: Clear to auscultation, nonlabored breathing at rest. Cardiac: Regular rate and rhythm, no S3 or significant systolic murmur, no pericardial rub. Abdomen: Soft, nontender, no hepatomegaly, bowel sounds present, no guarding or rebound. Extremities: No pitting edema, distal pulses 2+. Skin: Warm and dry. Musculoskeletal: No kyphosis. Neuropsychiatric: Alert and oriented x3, affect grossly appropriate.  ECG:  {EKG/Telemetry Strips Reviewed:6820450739}  Recent Labwork: 09/29/2020: B Natriuretic Peptide 161.0 11/09/2020: ALT 13; AST 8; Magnesium 2.2; TSH 9.938 11/11/2020: BUN 27; Creatinine, Ser 6.64; Potassium 3.7; Sodium 135 12/02/2020: Hemoglobin 10.6; Platelets 135     Component Value Date/Time   CHOL 123 11/10/2020 0933   TRIG 87 11/10/2020 0933   HDL 58 11/10/2020 0933   CHOLHDL 2.1 11/10/2020 0933   VLDL 17 11/10/2020  0933   LDLCALC 48 11/10/2020 0933    Other Studies Reviewed Today:   Assessment and Plan:  No diagnosis found.   Medication Adjustments/Labs and Tests Ordered: Current medicines are reviewed at length with the patient today.  Concerns regarding medicines are outlined above.   Disposition: Follow-up with ***  Signed, Levell July, NP 12/02/2020 9:14 PM    [facility] at [facility] [place], [place], [place] [address] Phone: [phone])  [phone]; Fax: [phone]

## 2020-12-02 NOTE — ED Provider Notes (Signed)
Emergency Medicine Provider Triage Evaluation Note  KENDALL ARNELL , a 80 y.o. female  was evaluated in triage.  Pt complains of feeling off balance and feeling generally weak. sxs ongoing for 6 days. Had fall at home and hit head. Last had dialysis today and completed entire session.  Review of Systems  Positive: Generalized weakness, off balance Negative: fever  Physical Exam  BP (!) 139/32 (BP Location: Right Arm)   Pulse 63   Resp 16   SpO2 98%  Gen:   Awake, no distress   Resp:  Normal effort  MSK:   Moves extremities without difficulty  Other:  Some word finding difficulty noted  Medical Decision Making  Medically screening exam initiated at 5:41 PM.  Appropriate orders placed.  [name] was informed that the remainder of the evaluation will be completed by another provider, this initial triage assessment does not replace that evaluation, and the importance of remaining in the ED until their evaluation is complete.     Bishop Dublin 12/02/20 1748    Lacretia Leigh, MD 12/04/20 [phone]

## 2020-12-02 NOTE — ED Notes (Signed)
This RN attempted to draw the CMP that is ordered but was unsuccessful. During this, transport arrived to take patient to MRI. Pt transported to MRI at this time and will attempt blood draw upon her arrival back to her room.

## 2020-12-03 ENCOUNTER — Inpatient Hospital Stay (HOSPITAL_COMMUNITY): Payer: Medicare HMO

## 2020-12-03 ENCOUNTER — Ambulatory Visit: Payer: Medicare HMO | Admitting: Cardiology

## 2020-12-03 ENCOUNTER — Ambulatory Visit: Payer: Medicare HMO | Admitting: Family Medicine

## 2020-12-03 ENCOUNTER — Encounter (HOSPITAL_COMMUNITY): Payer: Self-pay | Admitting: Internal Medicine

## 2020-12-03 DIAGNOSIS — M609 Myositis, unspecified: Secondary | ICD-10-CM | POA: Diagnosis present

## 2020-12-03 DIAGNOSIS — R29898 Other symptoms and signs involving the musculoskeletal system: Secondary | ICD-10-CM

## 2020-12-03 DIAGNOSIS — E785 Hyperlipidemia, unspecified: Secondary | ICD-10-CM

## 2020-12-03 DIAGNOSIS — G729 Myopathy, unspecified: Secondary | ICD-10-CM

## 2020-12-03 DIAGNOSIS — I480 Paroxysmal atrial fibrillation: Secondary | ICD-10-CM

## 2020-12-03 LAB — CBC
HCT: 26.5 % — ABNORMAL LOW (ref 36.0–46.0)
Hemoglobin: 8.1 g/dL — ABNORMAL LOW (ref 12.0–15.0)
MCH: 36.7 pg — ABNORMAL HIGH (ref 26.0–34.0)
MCHC: 30.6 g/dL (ref 30.0–36.0)
MCV: 119.9 fL — ABNORMAL HIGH (ref 80.0–100.0)
Platelets: 281 10*3/uL (ref 150–400)
RBC: 2.21 MIL/uL — ABNORMAL LOW (ref 3.87–5.11)
RDW: 17.5 % — ABNORMAL HIGH (ref 11.5–15.5)
WBC: 7.9 10*3/uL (ref 4.0–10.5)
nRBC: 0 % (ref 0.0–0.2)

## 2020-12-03 LAB — FOLATE: Folate: 54 ng/mL (ref >5.9)

## 2020-12-03 LAB — MAGNESIUM: Magnesium: 1.9 mg/dL (ref 1.7–2.4)

## 2020-12-03 LAB — BASIC METABOLIC PANEL
Anion gap: 10 (ref 5–15)
BUN: 12 mg/dL (ref 8–23)
CO2: 29 mmol/L (ref 22–32)
Calcium: 10 mg/dL (ref 8.9–10.3)
Chloride: 97 mmol/L — ABNORMAL LOW (ref 98–111)
Creatinine, Ser: 4.81 mg/dL — ABNORMAL HIGH (ref 0.44–1.00)
GFR, Estimated: 9 mL/min — ABNORMAL LOW (ref >60)
Glucose, Bld: 140 mg/dL — ABNORMAL HIGH (ref 70–99)
Potassium: 3.8 mmol/L (ref 3.5–5.1)
Sodium: 136 mmol/L (ref 135–145)

## 2020-12-03 LAB — CK: Total CK: 47 U/L (ref 38–234)

## 2020-12-03 LAB — TSH: TSH: 9.193 u[IU]/mL — ABNORMAL HIGH (ref 0.350–4.500)

## 2020-12-03 LAB — T4, FREE: Free T4: 0.98 ng/dL (ref 0.61–1.12)

## 2020-12-03 LAB — C-REACTIVE PROTEIN: CRP: 0.8 mg/dL (ref <1.0)

## 2020-12-03 LAB — SEDIMENTATION RATE: Sed Rate: 25 mm/hr — ABNORMAL HIGH (ref 0–22)

## 2020-12-03 LAB — SARS CORONAVIRUS 2 (TAT 6-24 HRS): SARS Coronavirus 2: NEGATIVE

## 2020-12-03 LAB — VITAMIN B12: Vitamin B-12: 791 pg/mL (ref 180–914)

## 2020-12-03 IMAGING — MR MR FEMUR*L* W/O CM
8 series · 40 of 40 positions shown · non-contrast
Comparison: None.

CLINICAL DATA: Bilateral upper leg weakness and achiness; onset
difficulty walking for approximately 1 month.

EXAM:
MR OF THE LEFT FEMUR WITHOUT CONTRAST
TECHNIQUE: Multiplanar, multisequence MR imaging of the left femur was
performed. No intravenous contrast was administered.

[Series 4: composed cor t1_comp · coronal · left · 6.0mm · 0.95mm/px · 5 of 35 slices shown]
[im 1/35]
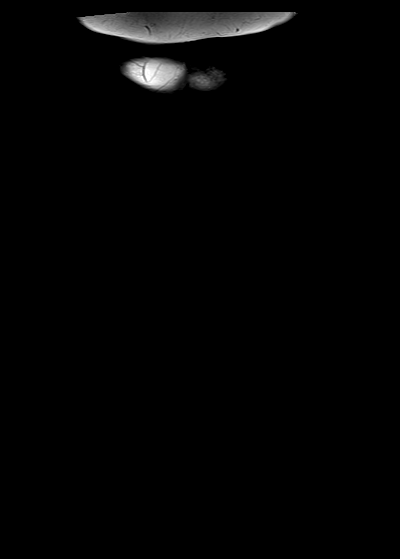
[im 9/35]
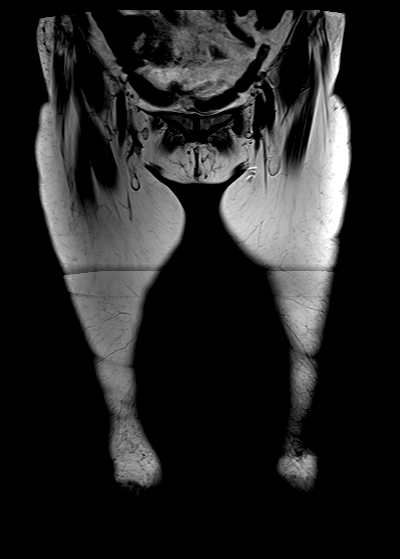
[im 18/35]
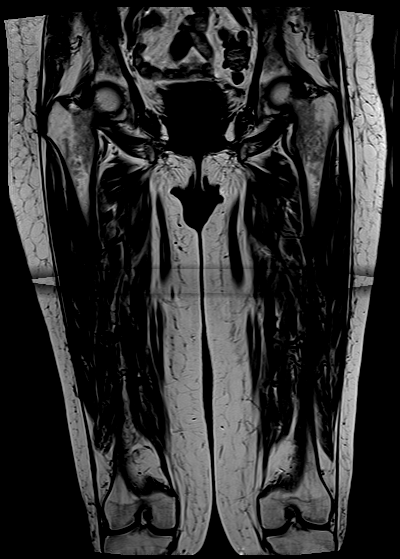
[im 26/35]
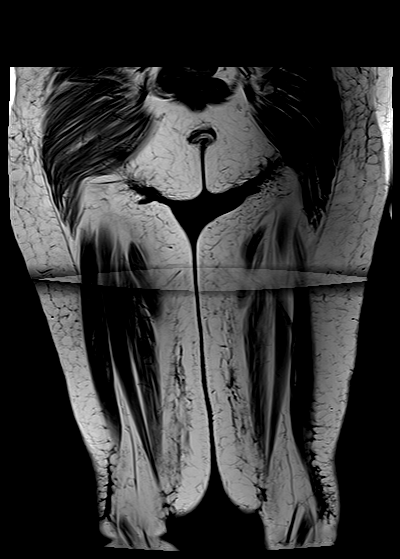
[im 35/35]
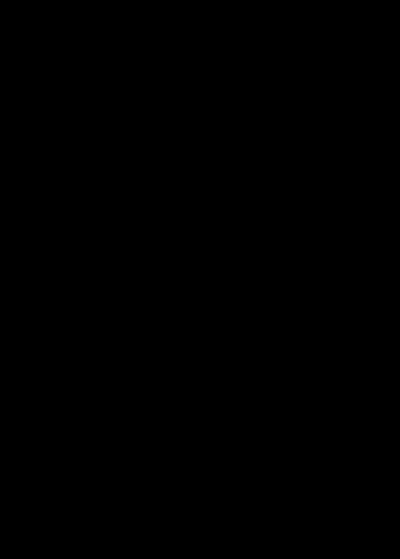

[Series 5: composed cor t1_comp_filt · coronal · left · 6.0mm · 0.95mm/px · 4 of 35 slices shown]
[im 1/35]
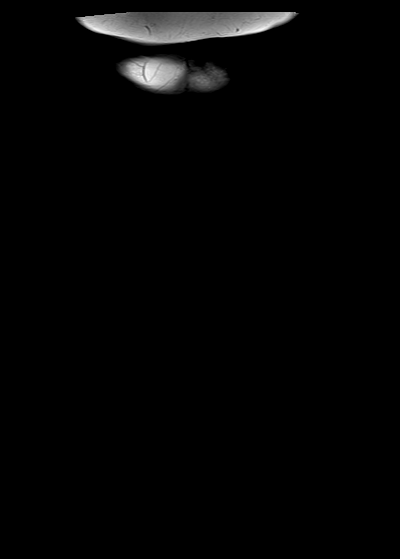
[im 12/35]
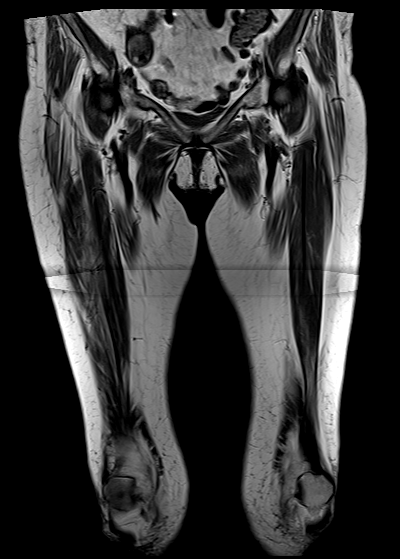
[im 23/35]
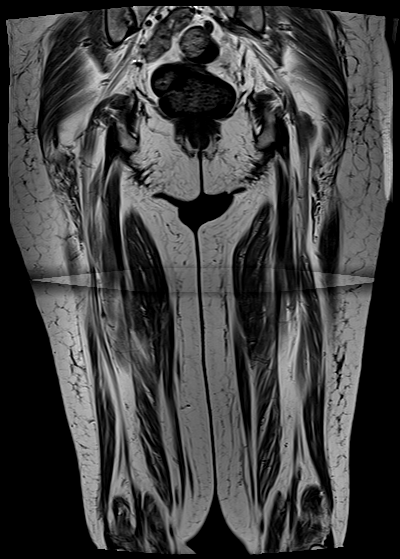
[im 35/35]
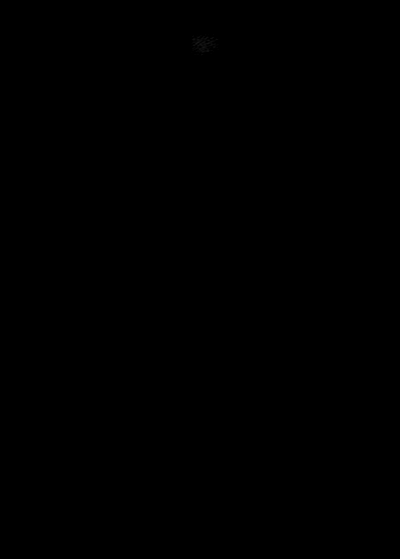

[Series 8: composed cor stir_comp · coronal · left · 6.0mm · 1.25mm/px · 4 of 36 slices shown]
[im 1/36]
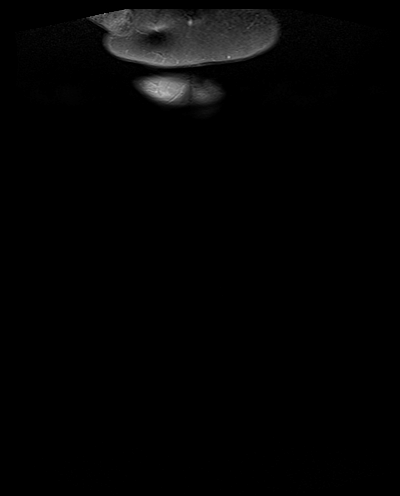
[im 12/36]
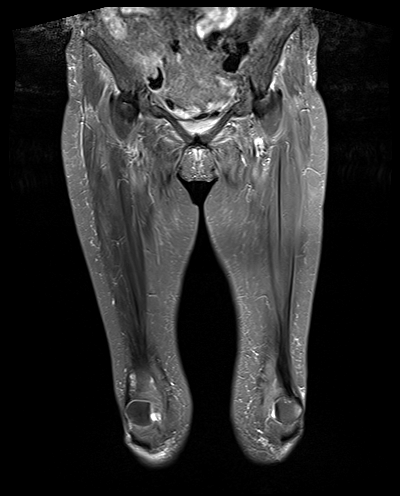
[im 24/36]
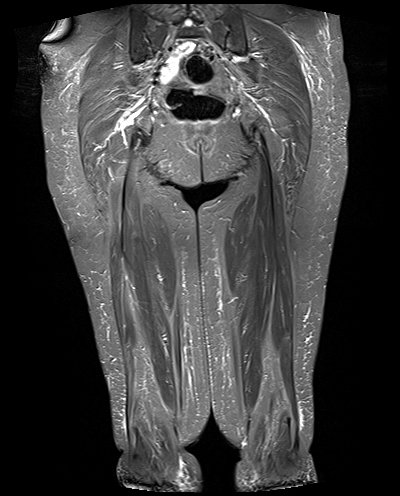
[im 36/36]
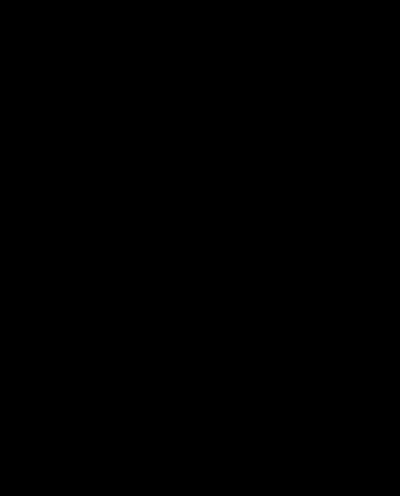

[Series 9: composed cor stir_comp_filt · coronal · left · 6.0mm · 1.25mm/px · 4 of 36 slices shown]
[im 1/36]
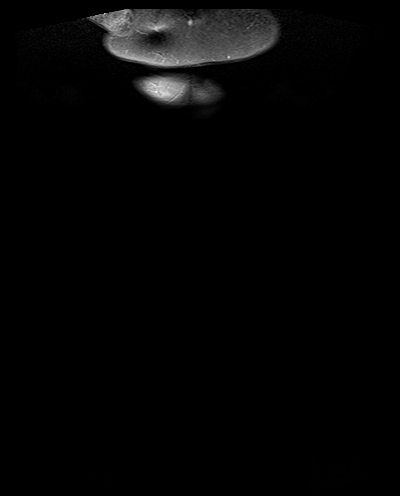
[im 12/36]
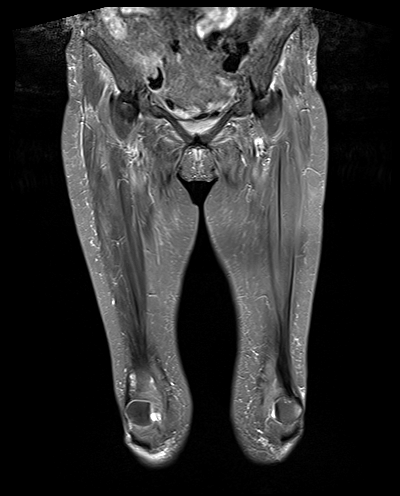
[im 24/36]
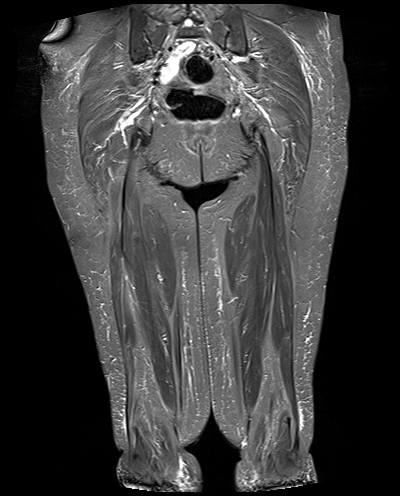
[im 36/36]
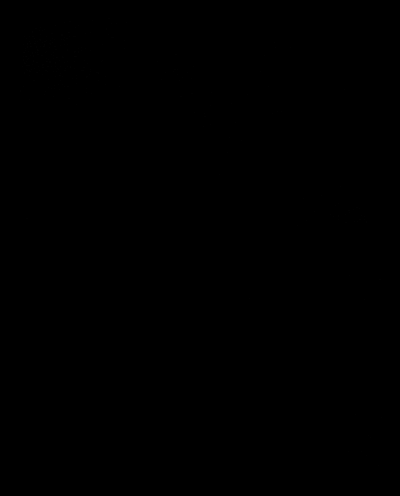

[Series 12: ax t1_comp · axial · left · 5.0mm · 0.70mm/px · z∈[-247,+229]mm · 9 of 76 slices shown]
[im 1/76]
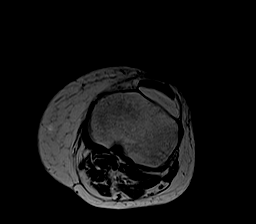
[im 10/76]
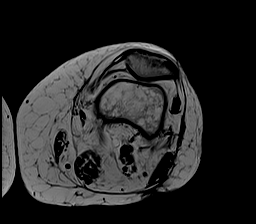
[im 19/76]
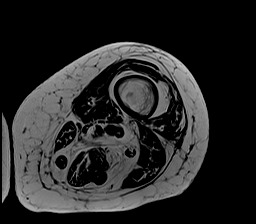
[im 29/76]
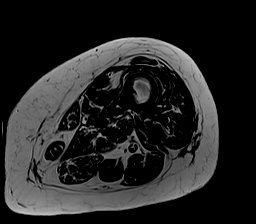
[im 38/76]
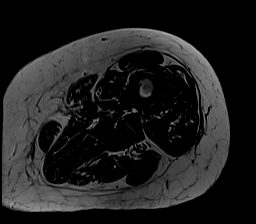
[im 47/76]
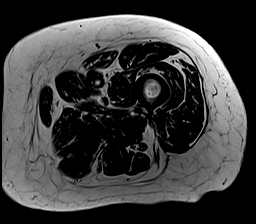
[im 57/76]
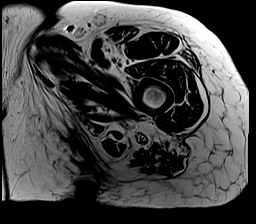
[im 66/76]
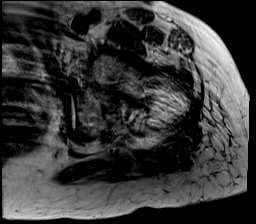
[im 76/76]
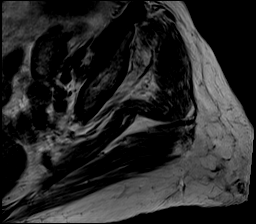

[Series 15: T2 · axial · left · 5.0mm · 0.87mm/px · z∈[-247,-12]mm · 5 of 38 slices shown (1 of 3)]
[im 1/38]
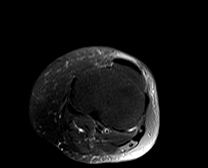
[im 10/38]
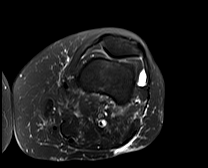
[im 19/38]
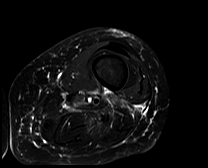
[im 28/38]
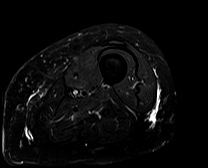
[im 38/38]
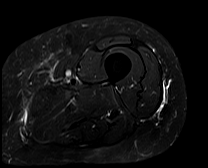

[Series 15: T2 · axial · left · 5.0mm · 0.87mm/px · z∈[-6,+229]mm · 5 of 38 slices shown (2 of 3)]
[im 1/38]
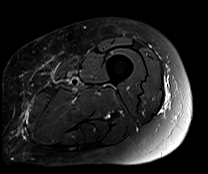
[im 10/38]
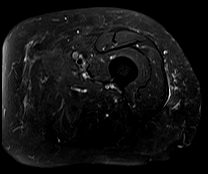
[im 19/38]
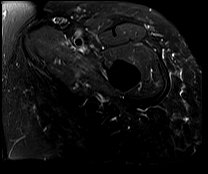
[im 28/38]
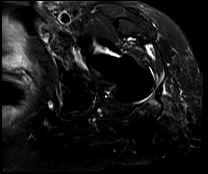
[im 38/38]
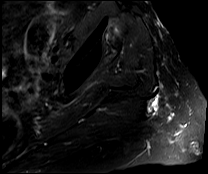

[Series 18: T2 · sagittal · left · 5.0mm · 1.15mm/px · 4 of 35 slices shown (3 of 3)]
[im 1/35]
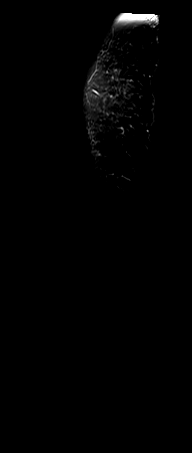
[im 12/35]
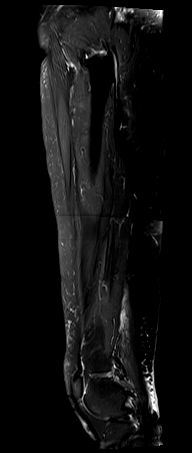
[im 23/35]
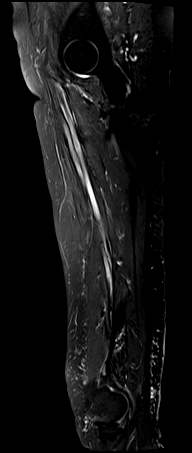
[im 35/35]
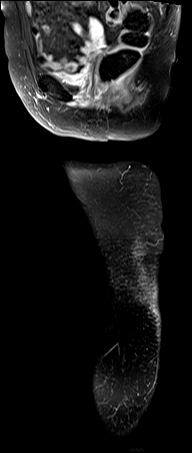

[40 of 40 positions shown; findings below may reference images not displayed]

FINDINGS: Bones/Joint/Cartilage

No fracture, stress change or worrisome lesion is identified. No hip
or SI joint effusion. Small bilateral knee joint effusions are
noted. Joint spaces about the hips and knees appear preserved. No
subchondral cyst formation or edema about any joint. No avascular
necrosis of the femoral heads.

Ligaments

Negative.

Muscles and Tendons

Intact without tear or strain. No evidence of inflammatory change,
atrophy or intramuscular mass. No fluid collection.

Soft tissues

Mild subcutaneous edema about both upper legs is likely due to
dependent change. Imaged intrapelvic contents demonstrate sigmoid
diverticulosis. The patient is status post hysterectomy.
IMPRESSION: No acute abnormality or finding to explain the patient's symptoms.

Mild bilateral subcutaneous edema is likely due to dependent change.

Diverticulosis.

## 2020-12-03 IMAGING — DX DG CHEST 1V PORT
1 series · 1 of 1 positions shown · non-contrast
Comparison: [DATE]

CLINICAL DATA: Dyspnea

EXAM:
PORTABLE CHEST 1 VIEW

[chest ap]
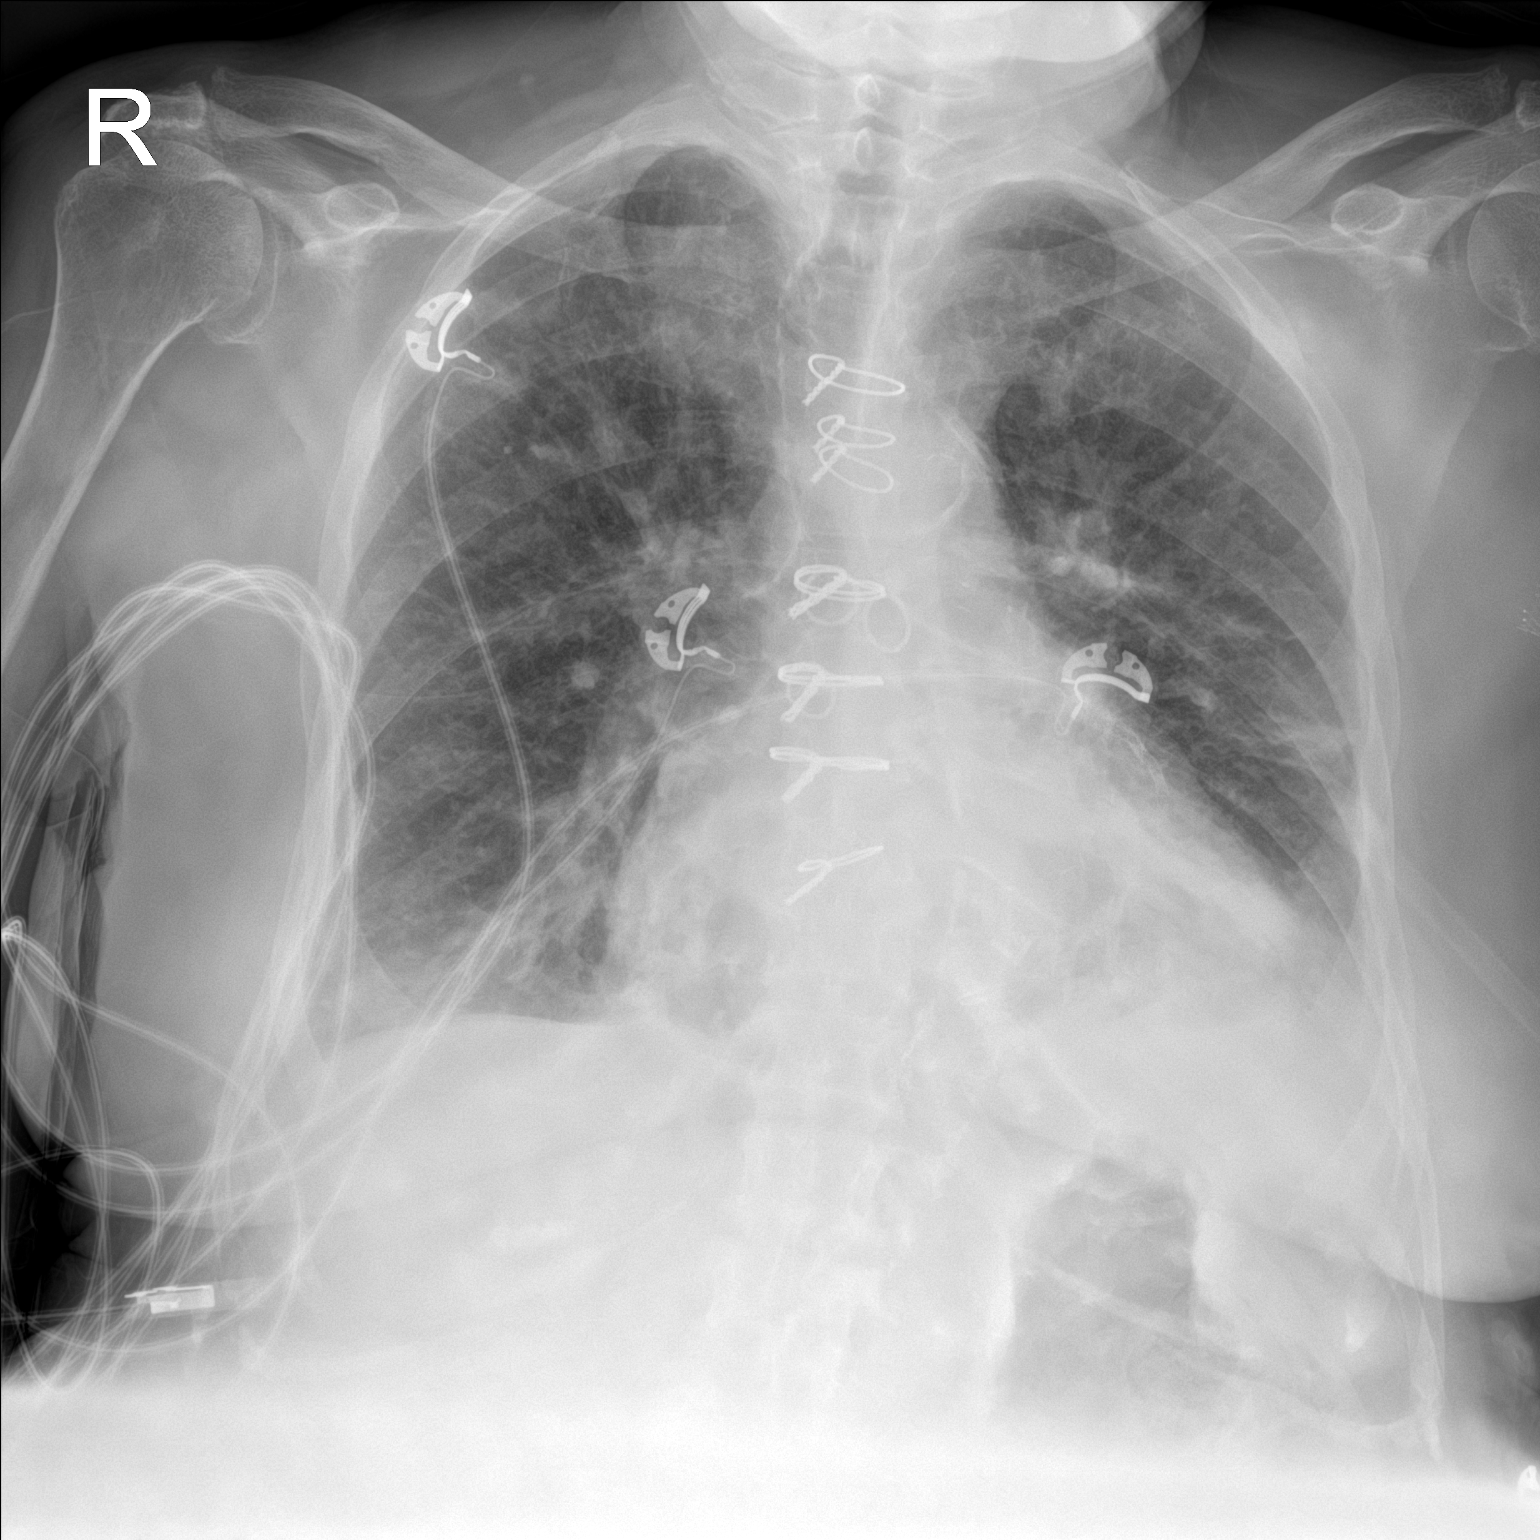

[1 of 1 positions shown; findings below may reference images not displayed]

FINDINGS: The lungs are symmetrically well expanded. Stable left basilar
scarring. Tiny left pleural effusion or pleural thickening is
unchanged. Coronary artery bypass grafting has been performed.
Cardiac size is mildly enlarged, unchanged. Previously noted
interstitial pulmonary edema has significantly improved. There is
mild persistent pulmonary vascular redistribution of the lung apices
in keeping with mild volume overload or early cardiogenic failure.
No acute bone abnormality.
IMPRESSION: Interval resolution of pulmonary edema with persistent mild central
pulmonary vascular congestion. Stable cardiomegaly.

Stable left basilar scarring. Small left pleural effusion or pleural
scarring.

## 2020-12-03 MED ORDER — ISOSORBIDE MONONITRATE ER 60 MG PO TB24
60.0000 mg | ORAL_TABLET | Freq: Two times a day (BID) | ORAL | Status: DC
  Administered 2020-12-03 – 2020-12-12 (×17): 60 mg via ORAL
  Filled 2020-12-03 (×4): qty 1
  Filled 2020-12-03: qty 2
  Filled 2020-12-03 (×5): qty 1
  Filled 2020-12-03: qty 2
  Filled 2020-12-03 (×7): qty 1

## 2020-12-03 MED ORDER — ASPIRIN EC 81 MG PO TBEC
81.0000 mg | DELAYED_RELEASE_TABLET | Freq: Every day | ORAL | Status: DC
  Administered 2020-12-03 – 2020-12-17 (×12): 81 mg via ORAL
  Filled 2020-12-03 (×13): qty 1

## 2020-12-03 MED ORDER — SEVELAMER CARBONATE 800 MG PO TABS
1600.0000 mg | ORAL_TABLET | Freq: Three times a day (TID) | ORAL | Status: DC
  Administered 2020-12-03 – 2020-12-17 (×27): 1600 mg via ORAL
  Filled 2020-12-03 (×27): qty 2

## 2020-12-03 MED ORDER — LABETALOL HCL 5 MG/ML IV SOLN: 5.0000 mg | Freq: Once | INTRAVENOUS | Status: DC

## 2020-12-03 MED ORDER — HYDROXYZINE HCL 50 MG PO TABS
100.0000 mg | ORAL_TABLET | Freq: Two times a day (BID) | ORAL | Status: DC | PRN
  Administered 2020-12-04 – 2020-12-06 (×4): 100 mg via ORAL
  Filled 2020-12-03 (×5): qty 2

## 2020-12-03 MED ORDER — LABETALOL HCL 5 MG/ML IV SOLN
5.0000 mg | Freq: Once | INTRAVENOUS | Status: AC
  Administered 2020-12-03: 5 mg via INTRAVENOUS
  Filled 2020-12-03: qty 4

## 2020-12-03 NOTE — ED Notes (Signed)
Lab called to notify this RN that the gold tops collected for the send out labs to [facility] were hemolyzed and that 4 gold tops need to be recollected. Lab said they would credit back the orders.

## 2020-12-03 NOTE — ED Notes (Signed)
Neurologist at bedside. 

## 2020-12-03 NOTE — Consult Note (Signed)
NEUROLOGY CONSULTATION NOTE   Date of service: Dec 03, 2020 Patient Name: Shawna [name] MRN:  [id number] DOB:  1941-04-16 Reason for consult: "Trouble ambulating" Requesting Provider: Lacretia Leigh MD _ _ _   _ __   _ __ _ _  __ __   _ __   __ _  History of Present Illness  Shawna [name] is a 80 y.o. female with PMH significant for CAD, ESRD on HD, hyperlipidemia, hypertension, peptic ulcer disease, paroxysmal A. fib on amiodarone and aspirin, TIAs, history of GI bleed who presents with trouble ambulating.  Patient reports that she has been having trouble with walking for about a month now and has been using a cane but may be even longer than that.  Reports that she is being having trouble with getting her legs to do what she wants them to.  Last week, she had a fall while she was try to get into her bed and she fell backwards and hit her head.  Today in the morning she was getting ready to go to dialysis and she tried to get up and walk and for some reason her legs kept going backwards.  She sat down and her husband helped her get ready and got her to the dialysis center. Husband brought her here for further evaluation.  She also reports significant pain in her leg muscles and in her lower back muscles.  She thinks this is been going on for about a month.  Endorses most trouble with rising from a sitting position or getting out of chair. She denies any numbness in her feet, no bowel or bladder incontinence, no saddle anesthesia, no Lhermitte sign.  On chart review, it appears that she was seen by neurology in February 2022 for gait impairment and balance.  This was attribute it to Ranexa which was stopped with significant improvement in her gait.  She was also noted to have vitamin B12 deficiency and she was placed on vitamin B12 replacement.   ROS   Constitutional Denies weight loss, fever and chills.   HEENT Denies changes in vision and hearing.   Respiratory Denies SOB and cough.   CV Denies  palpitations and CP   GI Denies abdominal pain, nausea, vomiting and diarrhea.   GU Denies dysuria and urinary frequency.   MSK Endorses myalgia but no joint pain.  Skin Denies rash and pruritus.  Neurological Denies headache and syncope.  Psychiatric Denies recent changes in mood. Denies anxiety and depression.    Past History   Past Medical History:  Diagnosis Date  . Acute on chronic respiratory failure with hypoxia ([place]) 10/10/2016  . Anxiety   . Arthritis   . AVM (arteriovenous malformation) of colon   . CAD (coronary artery disease)    a. s/p CABG in 2013 b. DES to D1 in 10/2016. c. cath in 07/2018 showing patent grafts with occlusion of D1 at prior stent site and progression of PDA disease --> medical management recommended  . Carotid artery disease ([place])    a. 64-15% LICA, 02/3093   . Chronic anemia   . Chronic bronchitis ([place])   . Chronic diastolic CHF (congestive heart failure) ([place])    a. 02/2012 Echo EF 60-65%, nl wall motion, Gr 1 DD, mod MR  . Colon cancer ([place]) 1992  . Esophageal stricture   . ESRD on hemodialysis ([place])    ESRD due to HTN, started dialysis 2011 and gets HD at [facility] with Dr Hinda Lenis on MWF  schedule.  Access is LUA AVF as of Sept 2014.   Marland Kitchen GERD (gastroesophageal reflux disease)   . High cholesterol 12/2011  . History of blood transfusion 07/2011; 12/2011; 01/2012 X 2; 04/2012  . History of gout   . History of lower GI bleeding   . Hypertension   . Iron deficiency anemia   . Mitral regurgitation    a. Moderate by echo, 02/2012  . Myocardial infarction ([place])   . NSVT (nonsustained ventricular tachycardia) ([place])   . Ovarian cancer ([place]) 1992  . PAF (paroxysmal atrial fibrillation) ([place])   . Pneumonia ~ 2009  . PUD (peptic ulcer disease)   . TIA (transient ischemic attack)    Past Surgical History:  Procedure Laterality Date  . A/V SHUNTOGRAM Left 03/19/2019   Procedure: A/V SHUNTOGRAM;  Surgeon: Katha Cabal, MD;  Location: [place]  CV LAB;  Service: Cardiovascular;  Laterality: Left;  . ABDOMINAL HYSTERECTOMY  1992  . APPENDECTOMY  06/1990  . AV FISTULA PLACEMENT  07/2009   left upper arm  . AV FISTULA PLACEMENT Right 09/06/2016   Procedure: RIGHT FOREARM ARTERIOVENOUS (AV) GRAFT;  Surgeon: Elam Dutch, MD;  Location: [facility] OR;  Service: Vascular;  Laterality: Right;  . AV FISTULA PLACEMENT N/A 02/24/2017   Procedure: INSERTION OF ARTERIOVENOUS (AV) GORE-TEX GRAFT ARM (BRACHIAL AXILLARY);  Surgeon: Katha Cabal, MD;  Location: [facility] ORS;  Service: Vascular;  Laterality: N/A;  . [place] Right 09/06/2016   Procedure: REMOVAL OF Right Arm ARTERIOVENOUS GORETEX GRAFT and Vein Patch angioplasty of brachial artery;  Surgeon: Angelia Mould, MD;  Location: [place];  Service: Vascular;  Laterality: Right;  . BIOPSY  09/26/2019   Procedure: BIOPSY;  Surgeon: Rogene Houston, MD;  [facility];  Service: Endoscopy;;  . COLON RESECTION  1992  . COLON SURGERY    . COLONOSCOPY N/A 03/09/2019   Procedure: COLONOSCOPY;  Surgeon: Rogene Houston, MD;  [facility];  Service: Endoscopy;  Laterality: N/A;  . CORONARY ANGIOPLASTY WITH STENT PLACEMENT  12/15/11   "2"  . CORONARY ANGIOPLASTY WITH STENT PLACEMENT  y/2013   "1; makes total of 3" (05/02/2012)  . CORONARY ARTERY BYPASS GRAFT  06/13/2012   Procedure: CORONARY ARTERY BYPASS GRAFTING (CABG);  Surgeon: Grace Isaac, MD;  Location: [place];  Service: Open Heart Surgery;  Laterality: N/A;  cabg x four;  using left internal mammary artery, and left leg greater saphenous vein harvested endoscopically  . CORONARY STENT INTERVENTION N/A 10/13/2016   Procedure: Coronary Stent Intervention;  Surgeon: Troy Sine, MD;  Location: [place] CV LAB;  Service: Cardiovascular;  Laterality: N/A;  . DIALYSIS/PERMA CATHETER REMOVAL N/A 04/18/2017   Procedure: DIALYSIS/PERMA CATHETER REMOVAL;  Surgeon: Katha Cabal, MD;  Location: [place] CV LAB;   Service: Cardiovascular;  Laterality: N/A;  . DILATION AND CURETTAGE OF UTERUS    . ESOPHAGOGASTRODUODENOSCOPY  01/20/2012   Procedure: ESOPHAGOGASTRODUODENOSCOPY (EGD);  Surgeon: Ladene Artist, MD,FACG;  Location: [facility] ENDOSCOPY;  Service: Endoscopy;  Laterality: N/A;  . ESOPHAGOGASTRODUODENOSCOPY N/A 03/26/2013   Procedure: ESOPHAGOGASTRODUODENOSCOPY (EGD);  Surgeon: Irene Shipper, MD;  Location: [facility] ENDOSCOPY;  Service: Endoscopy;  Laterality: N/A;  . ESOPHAGOGASTRODUODENOSCOPY N/A 04/30/2015   Procedure: ESOPHAGOGASTRODUODENOSCOPY (EGD);  Surgeon: Rogene Houston, MD;  [facility];  Service: Endoscopy;  Laterality: N/A;  1pm - moved to 10/20 @ 1:10  . ESOPHAGOGASTRODUODENOSCOPY N/A 07/29/2016   Procedure: ESOPHAGOGASTRODUODENOSCOPY (EGD);  Surgeon: Renelda Loma  Havery Moros, MD;  Location: [place];  Service: Gastroenterology;  Laterality: N/A;  enteroscopy  . ESOPHAGOGASTRODUODENOSCOPY N/A 09/26/2019   Procedure: ESOPHAGOGASTRODUODENOSCOPY (EGD);  Surgeon: Rogene Houston, MD;  [facility];  Service: Endoscopy;  Laterality: N/A;  1250  . GIVENS CAPSULE STUDY N/A 03/07/2019   Procedure: GIVENS CAPSULE STUDY;  Surgeon: Rogene Houston, MD;  [facility];  Service: Endoscopy;  Laterality: N/A;  7:30  . INSERTION OF DIALYSIS CATHETER N/A 10/05/2020   Procedure: ABORTED TUNNELED DIALYSIS CATHETER PLACEMENT RIGHT INTERNAL JUGULAR VEIN ;  Surgeon: Virl Cagey, MD;  [facility];  Service: General;  Laterality: N/A;  . INTRAOPERATIVE TRANSESOPHAGEAL ECHOCARDIOGRAM  06/13/2012   Procedure: INTRAOPERATIVE TRANSESOPHAGEAL ECHOCARDIOGRAM;  Surgeon: Grace Isaac, MD;  Location: [place];  Service: Open Heart Surgery;  Laterality: N/A;  . IR DIALY SHUNT INTRO NEEDLE/INTRACATH INITIAL W/IMG LEFT Left 10/06/2020  . IR FLUORO GUIDE CV LINE RIGHT  06/17/2020  . IR GENERIC HISTORICAL  07/26/2016   IR FLUORO GUIDE CV LINE RIGHT 07/26/2016 Greggory Keen, MD [facility]   . IR GENERIC HISTORICAL  07/26/2016   IR US GUIDE VASC ACCESS RIGHT 07/26/2016 Greggory Keen, MD [facility]  . IR GENERIC HISTORICAL  08/02/2016   IR US GUIDE VASC ACCESS RIGHT 08/02/2016 Greggory Keen, MD [facility]  . IR GENERIC HISTORICAL  08/02/2016   IR FLUORO GUIDE CV LINE RIGHT 08/02/2016 Greggory Keen, MD [facility]  . IR RADIOLOGY PERIPHERAL GUIDED IV START  03/28/2017  . IR REMOVAL TUN CV CATH W/O FL  08/11/2020  . IR THROMBECTOMY AV FISTULA W/THROMBOLYSIS INC/SHUNT/IMG LEFT Left 06/17/2020  . IR US GUIDE VASC ACCESS LEFT  06/17/2020  . IR US GUIDE VASC ACCESS RIGHT  03/28/2017  . IR US GUIDE VASC ACCESS RIGHT  06/17/2020  . LEFT HEART CATH AND CORONARY ANGIOGRAPHY N/A 09/20/2016   Procedure: Left Heart Cath and Coronary Angiography;  Surgeon: [name], MD;  Location: [place] CV LAB;  Service: Cardiovascular;  Laterality: N/A;  . LEFT HEART CATH AND CORS/GRAFTS ANGIOGRAPHY N/A 10/13/2016   Procedure: Left Heart Cath and Cors/Grafts Angiography;  Surgeon: Troy Sine, MD;  Location: [place] CV LAB;  Service: Cardiovascular;  Laterality: N/A;  . LEFT HEART CATH AND CORS/GRAFTS ANGIOGRAPHY N/A 07/13/2018   Procedure: LEFT HEART CATH AND CORS/GRAFTS ANGIOGRAPHY;  Surgeon: Martinique, Peter M, MD;  Location: [place] CV LAB;  Service: Cardiovascular;  Laterality: N/A;  . LEFT HEART CATHETERIZATION WITH CORONARY ANGIOGRAM N/A 12/15/2011   Procedure: LEFT HEART CATHETERIZATION WITH CORONARY ANGIOGRAM;  Surgeon: Burnell Blanks, MD;  Location: [facility] CATH LAB;  Service: Cardiovascular;  Laterality: N/A;  . LEFT HEART CATHETERIZATION WITH CORONARY ANGIOGRAM N/A 01/10/2012   Procedure: LEFT HEART CATHETERIZATION WITH CORONARY ANGIOGRAM;  Surgeon: Peter M Martinique, MD;  Location: [facility] CATH LAB;  Service: Cardiovascular;  Laterality: N/A;  . LEFT HEART CATHETERIZATION WITH CORONARY ANGIOGRAM N/A 06/08/2012   Procedure: LEFT HEART CATHETERIZATION WITH CORONARY ANGIOGRAM;  Surgeon: Burnell Blanks, MD;  Location: [facility] CATH LAB;  Service: Cardiovascular;  Laterality: N/A;  . LEFT HEART CATHETERIZATION WITH CORONARY/GRAFT ANGIOGRAM N/A 12/10/2013   Procedure: LEFT HEART CATHETERIZATION WITH Beatrix Fetters;  Surgeon: [name], MD;  Location: [facility] CATH LAB;  Service: Cardiovascular;  Laterality: N/A;  . OVARY SURGERY     ovarian cancer  . POLYPECTOMY  03/09/2019   Procedure: POLYPECTOMY;  Surgeon: Rogene Houston, MD;  [facility];  Service:  Endoscopy;;  cecal   . POLYPECTOMY N/A 09/26/2019   Procedure: DUODENAL POLYPECTOMY;  Surgeon: Rogene Houston, MD;  [facility];  Service: Endoscopy;  Laterality: N/A;  . REVISION OF ARTERIOVENOUS GORETEX GRAFT N/A 02/24/2017   Procedure: REVISION OF ARTERIOVENOUS GORETEX GRAFT (RESECTION);  Surgeon: Katha Cabal, MD;  Location: [facility] ORS;  Service: Vascular;  Laterality: N/A;  . REVISON OF ARTERIOVENOUS FISTULA Left 06/19/2020   Procedure: REVISION OF LEFT UPPER ARM AV GRAFT WITH INTERPOSITION JUMP GRAFT USING 6MM GORE LIMB;  Surgeon: Marty Heck, MD;  Location: [place];  Service: Vascular;  Laterality: Left;  . SHUNTOGRAM N/A 10/15/2013   Procedure: Fistulogram;  Surgeon: Serafina Mitchell, MD;  Location: [facility] CATH LAB;  Service: Cardiovascular;  Laterality: N/A;  . THROMBECTOMY / ARTERIOVENOUS GRAFT REVISION  2011   left upper arm  . TUBAL LIGATION  1980's  . UPPER EXTREMITY ANGIOGRAPHY Bilateral 12/06/2016   Procedure: Upper Extremity Angiography;  Surgeon: Katha Cabal, MD;  Location: [name] CV LAB;  Service: Cardiovascular;  Laterality: Bilateral;  . UPPER EXTREMITY INTERVENTION Left 06/06/2017   Procedure: UPPER EXTREMITY INTERVENTION;  Surgeon: Katha Cabal, MD;  Location: [place] CV LAB;  Service: Cardiovascular;  Laterality: Left;   Family History  Problem Relation Age of Onset  . Heart disease Mother        Heart Disease before age 15  . Hyperlipidemia Mother    . Hypertension Mother   . Diabetes Mother   . Heart attack Mother   . Heart disease Father        Heart Disease before age 63  . Hyperlipidemia Father   . Hypertension Father   . Diabetes Father   . Diabetes Sister   . Hypertension Sister   . Diabetes Brother   . Hyperlipidemia Brother   . Heart attack Brother   . Hypertension Sister   . Heart attack Brother   . Colon cancer Child 41  . Other Other        noncontributory for early CAD  . Esophageal cancer Neg Hx   . Liver disease Neg Hx   . Kidney disease Neg Hx   . Colon polyps Neg Hx    Social History   Socioeconomic History  . Marital status: Married    Spouse name: [name]  . Number of children: Not on file  . Years of education: Not on file  . Highest education level: Not on file  Occupational History  . Not on file  Tobacco Use  . Smoking status: Never Smoker  . Smokeless tobacco: Never Used  Vaping Use  . Vaping Use: Never used  Substance and Sexual Activity  . Alcohol use: No    Alcohol/week: 0.0 standard drinks  . Drug use: No  . Sexual activity: Yes    Birth control/protection: Surgical  Other Topics Concern  . Not on file  Social History Narrative   Lives in [place], [place] with husband.  Dialysis pt - [facility]wf.   Social Determinants of Health   Financial Resource Strain: Not on file  Food Insecurity: Not on file  Transportation Needs: Not on file  Physical Activity: Not on file  Stress: Not on file  Social Connections: Not on file   Allergies  Allergen Reactions  . Aspirin Other (See Comments)    High Doses Mess up her stomach; "makes my bowels have blood in them". Takes 81 mg EC Aspirin   . Penicillins Other (See Comments)  SYNCOPE? , "makes me real weak when I take it; like I'll pass out"  Has patient had a PCN reaction causing immediate rash, facial/tongue/throat swelling, SOB or lightheadedness with hypotension: Yes Has patient had a PCN reaction causing severe rash involving mucus  membranes or skin necrosis: no Has patient had a PCN reaction that required hospitalization no Has patient had a PCN reaction occurring within the last 10 years: no If all of the above  . Amlodipine Swelling  . Bactrim [Sulfamethoxazole-Trimethoprim] Rash  . Contrast Media [Iodinated Diagnostic Agents] Itching  . Iron Itching and Other (See Comments)    "they gave me iron in dialysis; had to give me Benadryl cause I had to have the iron" (05/02/2012)  . Nitrofurantoin Hives  . Tylenol [Acetaminophen] Itching and Other (See Comments)    Makes her feet on fire per pt  . Gabapentin Other (See Comments)    Unknown reaction  . Hydralazine Itching  . Levofloxacin   . No Known Allergies   . Ranexa [Ranolazine] Other (See Comments)    Myoclonus-hospitalized   . Dexilant [Dexlansoprazole] Other (See Comments)    Upset stomach  . Levaquin [Levofloxacin In D5w] Rash  . Morphine And Related Itching and Other (See Comments)    Itching in feet  . Plavix [Clopidogrel Bisulfate] Rash  . Protonix [Pantoprazole Sodium] Rash  . Venofer [Ferric Oxide] Itching and Other (See Comments)    Patient reports using Benadryl prior to doses as [facility]    Medications  (Not in a hospital admission)    Vitals   Vitals:   12/02/20 2215 12/02/20 2230 12/02/20 2330 12/03/20 0050  BP: (!) 183/56 (!) 183/55 (!) 188/50   Pulse: 66 68 72   Resp:  16 16   Temp:    98.6 F (37 C)  TempSrc:    Oral  SpO2: 98% 97% 93%      There is no height or weight on file to calculate BMI.  Physical Exam   General: Laying comfortably in bed; in no acute distress. HENT: Normal oropharynx and mucosa. Normal external appearance of ears and nose. Neck: Supple, no pain or tenderness  CV: No JVD. No peripheral edema.  Pulmonary: Symmetric Chest rise. Normal respiratory effort.  Abdomen: Soft to touch, non-tender.  Ext: No cyanosis, edema, or deformity  Skin: No rash. Normal palpation of skin.   Musculoskeletal:  Normal digits and nails by inspection. No clubbing.   Neurologic Examination  Mental status/Cognition: Alert, oriented to self, place, year but not month, good attention.  Speech/language: Fluent, comprehension intact, object naming intact, repetition intact.  Cranial nerves:   CN II Pupils equal and reactive to light, no VF deficits    CN III,IV,VI EOM intact, no gaze preference or deviation, no nystagmus    CN V normal sensation in V1, V2, and V3 segments bilaterally    CN VII no asymmetry, no nasolabial fold flattening    CN VIII normal hearing to speech    CN IX & X normal palatal elevation, no uvular deviation    CN XI 5/5 head turn and 5/5 shoulder shrug bilaterally    CN XII midline tongue protrusion    Motor:  Muscle bulk: poor, tone paratonia, pronator drift none tremor none.  Significant tenderness to palpation in thigh muscle. Mvmt Root Nerve  Muscle Right Left Comments  SA C5/6 Ax Deltoid 4+ 4+   EF C5/6 Mc Biceps 5 5   EE C6/7/8 Rad Triceps 5 5  WF C6/7 Med FCR     WE C7/8 PIN ECU     F Ab C8/T1 U ADM/FDI 5 5   HF L1/2/3 Fem Illopsoas 4 4   KE L2/3/4 Fem Quad 4+ 4+   DF L4/5 D Peron Tib Ant 5 5   PF S1/2 Tibial Grc/Sol 5 5    Reflexes:  Right Left Comments  Pectoralis      Biceps (C5/6) 1 1   Brachioradialis (C5/6) 1 1    Triceps (C6/7) 1 1    Patellar (L3/4) 1 1    Achilles (S1)      Hoffman      Plantar mute mute   Jaw jerk    Sensation:  Light touch intact   Pin prick    Temperature    Vibration   Proprioception    Coordination/Complex Motor:  - Finger to Nose intact BL - Heel to shin with significant difficulty due to hip flexion weakness and pain but no obvious ataxia out of proportion to her weakness. - Rapid alternating movement are slowed. - Gait: Deferred.  Labs   CBC:  Recent Labs  Lab 12/02/20 1746  WBC 6.0  NEUTROABS 4.0  HGB 10.6*  HCT 35.0*  MCV 120.7*  PLT 135*    Basic Metabolic Panel:  Lab Results  Component Value  Date   NA 135 11/11/2020   K 3.7 11/11/2020   CO2 29 11/11/2020   GLUCOSE 98 11/11/2020   BUN 27 (H) 11/11/2020   CREATININE 6.64 (H) 11/11/2020   CALCIUM 10.2 11/11/2020   GFRNONAA 6 (L) 11/11/2020   GFRAA 9 (L) 02/12/2020   Lipid Panel:  Lab Results  Component Value Date   LDLCALC 48 11/10/2020   HgbA1c:  Lab Results  Component Value Date   HGBA1C 4.3 (L) 11/09/2020   Urine Drug Screen: No results found for: LABOPIA, COCAINSCRNUR, LABBENZ, AMPHETMU, THCU, LABBARB  Alcohol Level No results found for: [facility]  MRI Brain:(personally reviewed) 1. No acute intracranial abnormality. 2. Age-related cerebral atrophy with advanced chronic microvascular ischemic disease, with a few scattered remote lacunar infarcts involving the left basal ganglia and left cerebellum.  Impression   Shawna [name] is a 80 y.o. female with PMH significant for ith PMH significant for CAD, ESRD on HD, hyperlipidemia, hypertension, peptic ulcer disease, paroxysmal A. fib on amiodarone and aspirin, TIAs, history of GI bleed who presents with trouble ambulating for about a month now that has been progressively worsening. Has tenderness to palpation in BL thigh muscles with proximal > distal weakness in Bilateral legs. Findings are most concerning for potential myopathy. Low concern for cord compression based on exam.  She is on simvastatin that is associated with myopathy, amiodarone has also been implicated on review on the literature. I do think that part of her weakness is due to deconditioning with sedentary lifestyle and several hospitalizations in the last few months.  Impression: - Myopathy - Deconditioning.  Recommendations  - MRI left femur without contrast given ESRD - CK and Aldolase - Stop Simvastatin - ANA with IFA, CRP and ESR. - Serum SPEP. - Vit B12, folate, TSH - PT and OT. ______________________________________________________________________   Thank you for the opportunity to take  part in the care of this patient. If you have any further questions, please contact the neurology consultation attending.  Signed,  Erick Blinks [facility] Pager Number [phone] _ _ _   _ __   _ __ _ _  __ __  _ __   __ _  

## 2020-12-03 NOTE — Progress Notes (Signed)
PROGRESS NOTE    Shawna [name]  IHK:[id number] DOB: 10/25/1940 DOA: 12/02/2020 PCP: [facility]   Brief Narrative: Shawna [name] is a 80 y.o. female with a history of paroxysmal atrial fibrillation, end-stage renal disease on HD, chronic diastolic heart failure, hypertension, hyperlipidemia.  Patient presented secondary to unsteadiness on feet and weakness with concern for myositis on admission.  Neurology was consulted on admission.  Initial work-up appears to be negative for myositis.    Assessment & Plan:   Principal Problem:   Myositis Active Problems:   ESRD on hemodialysis (HCC)   HLD (hyperlipidemia)   Chronic diastolic CHF (congestive heart failure) (HCC)   Essential hypertension   Paroxysmal atrial fibrillation (HCC)   Leg weakness Initial concern for myositis. Initial workup appears unremarkable for this diagnosis with normal CK, CRP, mildly elevated ESR and negative femur MRI. MRI brain negative for acute stroke -Neurology recommendations pending -PT/OT eval  Confusion Unknown etiology. Per husband, this has been an issue for the last few days since her fall. Possibly related to a concussion. Patient's TSH is mildly elevated in setting of amiodarone use as an outpatient. Patient does not make urine. No acute abnormalities on head imaging but there is evidence of chronic vascular disease. -Watch mental status for now  Paroxysmal atrial fibrillation Currently in sinus rhythm. Discussed with cardiology about amiodarone who recommended stopping amiodarone in setting of elevated TSH. Patient is not on anticoagulation in setting of recurrent acute GI bleeding.  ESRD on HD Last HD session on 5/25. No obvious indication for urgent HD -Nephrology consult for HD tomorrow  Primary hypertension -Continue Imdur  Chronic diastolic heart failure Stable.   DVT prophylaxis: SCDs  Code Status:   Code Status: Full Code Family Communication: Husband on telephone  (9 minutes) Disposition Plan: Discharge likely home vs SNF in 2-3 days pending improvement of mental status in addition to neurology recommendations for weakness   Consultants:   Neurology  Procedures:   None  Antimicrobials:  None    Subjective: No concerns this morning. States her weakness is improved. Per husband, patient has been confused for the past week or so and even since her last admission. She has been worse the past few days since she fell.  Objective: Vitals:   12/03/20 0600 12/03/20 0700 12/03/20 0715 12/03/20 0748  BP: (!) 166/44 (!) 162/41 (!) 153/36   Pulse: 70 67 62   Resp: _0 Temp:    99 F (37.2 C)  TempSrc:    Oral  SpO2: 93% 96% 100%     Intake/Output Summary (Last 24 hours) at 12/03/2020 0930 Last data filed at 12/03/2020 0910 Gross per 24 hour  Intake 23.17 ml  Output --  Net 23.17 ml   There were no vitals filed for this visit.  Examination:  General exam: Appears calm and comfortable Respiratory system: Clear to auscultation. Respiratory effort normal. Cardiovascular system: S1 & S2 heard, RRR. No murmurs, rubs, gallops or clicks. Gastrointestinal system: Abdomen is nondistended, soft and nontender. No organomegaly or masses felt. Normal bowel sounds heard. Central nervous system: Alert and oriented to person. No focal neurological deficits. 3/5 LE strength bilaterally Musculoskeletal: No edema. No calf tenderness Skin: No cyanosis. No rashes Psychiatry: Judgement and insight appear impaired. Memory impaired    Data Reviewed: I have personally reviewed following labs and imaging studies  CBC Lab Results  Component Value Date   WBC 7.9 12/03/2020   RBC 2.21 (L) 12/03/2020  HGB 8.1 (L) 12/03/2020   HCT 26.5 (L) 12/03/2020   MCV 119.9 (H) 12/03/2020   MCH 36.7 (H) 12/03/2020   PLT 281 12/03/2020   MCHC 30.6 12/03/2020   RDW 17.5 (H) 12/03/2020   LYMPHSABS 1.0 12/02/2020   MONOABS 0.5 12/02/2020   EOSABS 0.4 12/02/2020    BASOSABS 0.1 26/37/8588     Last metabolic panel Lab Results  Component Value Date   NA 136 12/03/2020   K 3.8 12/03/2020   CL 97 (L) 12/03/2020   CO2 29 12/03/2020   BUN 12 12/03/2020   CREATININE 4.81 (H) 12/03/2020   GLUCOSE 140 (H) 12/03/2020   GFRNONAA 9 (L) 12/03/2020   GFRAA 9 (L) 02/12/2020   CALCIUM 10.0 12/03/2020   PHOS 6.3 (H) 11/10/2020   PROT 7.0 11/09/2020   ALBUMIN 3.1 (L) 11/10/2020   LABGLOB 2.8 12/26/2018   AGRATIO 1.4 12/26/2018   BILITOT 0.6 11/09/2020   ALKPHOS 80 11/09/2020   AST 8 (L) 11/09/2020   ALT 13 11/09/2020   ANIONGAP 10 12/03/2020    CBG (last 3)  No results for input(s): GLUCAP in the last 72 hours.   GFR: Estimated Creatinine Clearance: 7.8 mL/min (A) (by C-G formula based on SCr of 4.81 mg/dL (H)).  Coagulation Profile: No results for input(s): INR, PROTIME in the last 168 hours.  Recent Results (from the past 240 hour(s))  SARS CORONAVIRUS 2 (TAT 6-24 HRS) Nasopharyngeal Nasopharyngeal Swab     Status: None   Collection Time: 12/02/20 11:18 PM   Specimen: Nasopharyngeal Swab  Result Value Ref Range Status   SARS Coronavirus 2 NEGATIVE NEGATIVE Final    Comment: (NOTE) SARS-CoV-2 target nucleic acids are NOT DETECTED.  The SARS-CoV-2 RNA is generally detectable in upper and lower respiratory specimens during the acute phase of infection. Negative results do not preclude SARS-CoV-2 infection, do not rule out co-infections with other pathogens, and should not be used as the sole basis for treatment or other patient management decisions. Negative results must be combined with clinical observations, patient history, and epidemiological information. The expected result is Negative.  Fact Sheet for Patients: SugarRoll.be  Fact Sheet for Healthcare Providers: https://www.woods-mathews.com/  This test is not yet approved or cleared by the [place] FDA and  has been authorized for  detection and/or diagnosis of SARS-CoV-2 by FDA under an Emergency Use Authorization (EUA). This EUA will remain  in effect (meaning this test can be used) for the duration of the COVID-19 declaration under Se ction 564(b)(1) of the Act, 21 U.S.C. section 360bbb-3(b)(1), unless the authorization is terminated or revoked sooner.  Performed at [facility], [place] [address], [place], [place] [address]         Radiology Studies: CT Head Wo Contrast  Result Date: 12/02/2020 CLINICAL DATA:  80 year old female with head trauma. EXAM: CT HEAD WITHOUT CONTRAST TECHNIQUE: Contiguous axial images were obtained from the base of the skull through the vertex without intravenous contrast. COMPARISON:  Head CT dated 11/09/2020. FINDINGS: Brain: Moderate age-related atrophy and chronic microvascular ischemic changes. There is no acute intracranial hemorrhage. No mass effect or midline shift. No extra-axial fluid collection. Vascular: No hyperdense vessel or unexpected calcification. Skull: Normal. Negative for fracture or focal lesion. Sinuses/Orbits: Small retention cyst or polyp in the left sphenoid sinus. The remainder of the visualized paranasal sinuses and mastoid air cells are clear. No air-fluid level. Partially visualized cystic area in the right maxilla at the right maxillary central incisor root. This may  represent an area of periapical lucency or a dentigerous cyst. Correlation with dental exam recommended. Other: None IMPRESSION: 1. No acute intracranial pathology. 2. Moderate age-related atrophy and chronic microvascular ischemic changes. Electronically Signed   By: Anner Crete M.D.   On: 12/02/2020 19:25   MR Brain Wo Contrast (neuro protocol)  Result Date: 12/02/2020 CLINICAL DATA:  Initial evaluation for acute dizziness. EXAM: MRI HEAD WITHOUT CONTRAST TECHNIQUE: Multiplanar, multiecho pulse sequences of the brain and surrounding structures were obtained without intravenous contrast.  COMPARISON:  Prior CT from earlier the same day as well as recent MRI from 11/10/2020. FINDINGS: Brain: Examination degraded by motion artifact. Generalized age-related cerebral atrophy. Fairly severe chronic microvascular ischemic disease noted involving the periventricular deep white matter both cerebral hemispheres, with more mild patchy involvement of the pons. Remote lacunar infarct at the left basal ganglia. Few additional small remote left cerebellar infarcts noted. No convincing foci of restricted diffusion to suggest acute or subacute ischemia. Gray-white matter differentiation maintained. No encephalomalacia to suggest chronic cortical infarction. No acute intracranial hemorrhage. Few scattered chronic micro hemorrhages noted about the cerebellum and right cerebral hemisphere, likely small vessel related. No mass lesion, midline shift or mass effect. No hydrocephalus or extra-axial fluid collection. Pituitary gland suprasellar region normal. Midline structures intact. Vascular: Major intracranial vascular flow voids are grossly maintained at the skull base. Skull and upper cervical spine: Craniocervical junction within normal limits. Bone marrow signal intensity normal. No focal marrow replacing lesion. No scalp soft tissue abnormality. Sinuses/Orbits: Globes and orbital soft tissues demonstrate no acute finding. Mild mucosal thickening noted within the left sphenoid sinus. Paranasal sinuses are otherwise clear. No mastoid effusion. Other: None. IMPRESSION: 1. No acute intracranial abnormality. 2. Age-related cerebral atrophy with advanced chronic microvascular ischemic disease, with a few scattered remote lacunar infarcts involving the left basal ganglia and left cerebellum. Electronically Signed   By: Jeannine Boga M.D.   On: 12/02/2020 22:43   MR FEMUR LEFT WO CONTRAST  Result Date: 12/03/2020 CLINICAL DATA:  Bilateral upper leg weakness and achiness; onset difficulty walking for  approximately 1 month. EXAM: MR OF THE LEFT FEMUR WITHOUT CONTRAST TECHNIQUE: Multiplanar, multisequence MR imaging of the left femur was performed. No intravenous contrast was administered. COMPARISON:  None. FINDINGS: Bones/Joint/Cartilage No fracture, stress change or worrisome lesion is identified. No hip or SI joint effusion. Small bilateral knee joint effusions are noted. Joint spaces about the hips and knees appear preserved. No subchondral cyst formation or edema about any joint. No avascular necrosis of the femoral heads. Ligaments Negative. Muscles and Tendons Intact without tear or strain. No evidence of inflammatory change, atrophy or intramuscular mass. No fluid collection. Soft tissues Mild subcutaneous edema about both upper legs is likely due to dependent change. Imaged intrapelvic contents demonstrate sigmoid diverticulosis. The patient is status post hysterectomy. IMPRESSION: No acute abnormality or finding to explain the patient's symptoms. Mild bilateral subcutaneous edema is likely due to dependent change. Diverticulosis. Electronically Signed   By: Inge Rise M.D.   On: 12/03/2020 08:31        Scheduled Meds: . aspirin EC  81 mg Oral Daily  . isosorbide mononitrate  60 mg Oral BID  . sevelamer carbonate  1,600 mg Oral TID WC   Continuous Infusions: . sodium chloride 10 mL/hr at 12/03/20 0910     LOS: 1 day     Cordelia Poche, MD [facility] 12/03/2020, 9:30 AM  If 7PM-7AM, please contact night-coverage [email or web address]

## 2020-12-03 NOTE — ED Notes (Signed)
Attempted report x1. 

## 2020-12-03 NOTE — ED Notes (Signed)
Pt transported to MRI 

## 2020-12-03 NOTE — Evaluation (Signed)
Physical Therapy Evaluation Patient Details Name: Shawna [name] MRN: [id number] DOB: 01/19/1941 Today's Date: 12/03/2020   History of Present Illness  Pt is an 80 y/o female admitted 5/25 secondary to inability to walk and BLE weakness (LLE>RLE). Thought to be secondary to myopathy per neuro notes. PMH includes ESRD on HD, HTN, a fib, CAD, dCHF.  Clinical Impression  Pt admitted secondary to problem above with deficits below. Tangential throughout session and falling asleep easily when lying supine. Improved alertness noted with mobility. Slow processing and required mod A to perform bed mobility tasks, min A to perform transfers. Pt with difficulty taking steps at EOB, especially with LLE. Feel pt would benefit from SNF level therapies at d/c. Will continue to follow acutely.     Follow Up Recommendations SNF;Supervision/Assistance - 24 hour    Equipment Recommendations  Wheelchair (measurements PT);Wheelchair cushion (measurements PT)    Recommendations for Other Services       Precautions / Restrictions Precautions Precautions: Fall Precaution Comments: Has had multiple falls at home. Restrictions Weight Bearing Restrictions: No      Mobility  Bed Mobility Overal bed mobility: Needs Assistance Bed Mobility: Supine to Sit;Sit to Supine     Supine to sit: Mod assist Sit to supine: Mod assist   General bed mobility comments: Mod A for sequencing and LE assist throughout. Slow to perform bed mobility tasks.    Transfers Overall transfer level: Needs assistance Equipment used: 1 person hand held assist Transfers: Sit to/from Stand Sit to Stand: Min assist         General transfer comment: Min A for lift assist and steadying to stand from elevated stretcher height. Pt with difficulty taking steps, especially with LLE along EOB. Was only able to take one step with mod A for stability. PT stood in front of pt and had pt hold to PT arms.  Ambulation/Gait                 Stairs            Wheelchair Mobility    Modified Rankin (Stroke Patients Only)       Balance Overall balance assessment: Needs assistance Sitting-balance support: Feet supported;No upper extremity supported Sitting balance-Leahy Scale: Fair     Standing balance support: Bilateral upper extremity supported;During functional activity Standing balance-Leahy Scale: Poor Standing balance comment: Reliant on UE and external support                             Pertinent Vitals/Pain Pain Assessment: No/denies pain    Home Living Family/patient expects to be discharged to:: Private residence Living Arrangements: Spouse/significant other Available Help at Discharge: Family ("most of the time") Type of Home: House Home Access: Stairs to enter Entrance Stairs-Rails: Right Entrance Stairs-Number of Steps: 2 Home Layout: One level Home Equipment: Cane - single point;Walker - 4 wheels Additional Comments: Information from previous admission as unsure of accuracy from pt.    Prior Function Level of Independence: Needs assistance   Gait / Transfers Assistance Needed: uses cane for ambulation  ADL's / Homemaking Assistance Needed: Assist times for bathing and dressing. Pt completes some cooking and cleaning per report. Husband also assists.        Hand Dominance        Extremity/Trunk Assessment   Upper Extremity Assessment Upper Extremity Assessment: Defer to OT evaluation    Lower Extremity Assessment Lower Extremity Assessment: Generalized weakness;RLE deficits/detail;LLE deficits/detail  RLE Deficits / Details: Was able to perform partial SLR. Noted swelling at ankle LLE Deficits / Details: Increased weakness compared to RLE. Difficulty performing SLR.    Cervical / Trunk Assessment Cervical / Trunk Assessment: Kyphotic  Communication   Communication: Expressive difficulties  Cognition Arousal/Alertness: Lethargic Behavior During Therapy: Flat  affect Overall Cognitive Status: No family/caregiver present to determine baseline cognitive functioning                                 General Comments: Pt lethargic and falling asleep initially when asking home questions. Increased alertness with mobility. Pt also very tangential and talking about subjects unrelated to questions asked. Slow processing noted as well.      General Comments      Exercises     Assessment/Plan    PT Assessment Patient needs continued PT services  PT Problem List Decreased strength;Decreased balance;Decreased activity tolerance;Decreased mobility;Decreased cognition;Decreased safety awareness;Decreased knowledge of precautions       PT Treatment Interventions DME instruction;Gait training;Functional mobility training;Therapeutic exercise;Therapeutic activities;Balance training;Patient/family education    PT Goals (Current goals can be found in the Care Plan section)  Acute Rehab PT Goals Patient Stated Goal: to go home PT Goal Formulation: With patient Time For Goal Achievement: 12/17/20 Potential to Achieve Goals: Fair    Frequency Min 2X/week   Barriers to discharge        Co-evaluation               AM-PAC PT "6 Clicks" Mobility  Outcome Measure Help needed turning from your back to your side while in a flat bed without using bedrails?: A Little Help needed moving from lying on your back to sitting on the side of a flat bed without using bedrails?: A Lot Help needed moving to and from a bed to a chair (including a wheelchair)?: A Lot Help needed standing up from a chair using your arms (e.g., wheelchair or bedside chair)?: A Little Help needed to walk in hospital room?: A Lot Help needed climbing 3-5 steps with a railing? : A Lot 6 Click Score: 14    End of Session Equipment Utilized During Treatment: Gait belt Activity Tolerance: Patient limited by fatigue;Patient limited by lethargy Patient left: in bed;with  call bell/phone within reach (on stretcher in ED) Nurse Communication: Mobility status PT Visit Diagnosis: Unsteadiness on feet (R26.81);Muscle weakness (generalized) (M62.81)    Time: 7494-4967 PT Time Calculation (min) (ACUTE ONLY): 17 min   Charges:   PT Evaluation $PT Eval Moderate Complexity: 1 Mod          Reuel Derby, PT, DPT  Acute Rehabilitation Services  Pager: [phone] Office: [phone]   Rudean Hitt 12/03/2020, 11:51 AM

## 2020-12-03 NOTE — ED Notes (Signed)
Pt oriented at this time

## 2020-12-04 LAB — ALDOLASE: Aldolase: 7.6 U/L (ref 3.3–10.3)

## 2020-12-04 LAB — PHOSPHORUS: Phosphorus: 7.5 mg/dL — ABNORMAL HIGH (ref 2.5–4.6)

## 2020-12-04 LAB — ANA W/REFLEX IF POSITIVE: Anti Nuclear Antibody (ANA): NEGATIVE

## 2020-12-04 LAB — T3: T3, Total: 129 ng/dL (ref 71–180)

## 2020-12-04 MED ORDER — CHLORHEXIDINE GLUCONATE CLOTH 2 % EX PADS
6.0000 | MEDICATED_PAD | Freq: Every day | CUTANEOUS | Status: DC
  Administered 2020-12-04 – 2020-12-08 (×5): 6 via TOPICAL

## 2020-12-04 NOTE — Progress Notes (Signed)
Dialysis called and asked a lot of questions in reference the the patient's vitals.  The dialysis RN said she does have access to the chart, "but I have a lot of patient's and we are short staffed".

## 2020-12-04 NOTE — Progress Notes (Signed)
RE: Shawna [name]. Dipinto Date of Birth: [birth year] Date: 12/04/20  Please be advised that the above-named patient will require a short-term nursing home stay - anticipated 30 days or less for rehabilitation and strengthening.  The plan is for return home.

## 2020-12-04 NOTE — NC FL2 (Addendum)
[place] MEDICAID FL2 LEVEL OF CARE SCREENING TOOL     IDENTIFICATION  Patient Name: Shawna [name] Birthdate: Dec 18, 1940 Sex: female Admission Date (Current Location): 12/02/2020  [facility] and [place] Number:  [employer] and Address:  [facility] [place], [place] [address], [place], [place] [address]      Provider Number: [id number]  Attending Physician Name and Address:  Mariel Aloe, MD  Relative Name and Phone Number:  [name], spouse, [phone]    Current Level of Care: Hospital Recommended Level of Care: [place] Prior Approval Number:    Date Approved/Denied:   PASRR Number: [id number] A, expires 12/29/20  Discharge Plan: SNF    Current Diagnoses: Patient Active Problem List   Diagnosis Date Noted  . Myositis 12/03/2020  . Ataxia 12/02/2020  . Diabetes mellitus type 2 in nonobese ([place]) 11/10/2020  . Acute pulmonary edema ([place]) 11/10/2020  . Generalized weakness 11/09/2020  . Dialysis AV fistula malfunction, initial encounter ([place])   . Jugular vein occlusion, right ([place])   . Failure of surgically constructed arteriovenous fistula ([place]) 10/03/2020  . Myoclonus 08/31/2020  . Clotted renal dialysis AV graft, initial encounter ([place])   . Hypertensive heart and chronic kidney disease with heart failure and stage 1 through stage 4 chronic kidney disease, or chronic kidney disease ([place])   . Hemodialysis-associated hypotension   . Acute hypoxemic respiratory failure ([place]) 06/14/2020  . Hypertension 06/12/2020  . Irritable bowel syndrome 02/25/2020  . Adenomatous duodenal polyp 09/10/2019  . History of GI bleed 09/10/2019  . Angina pectoris ([place]) 06/05/2019  . Chest pain 06/03/2019  . Small intestinal bacterial overgrowth 05/14/2019  . Iron deficiency anemia 04/02/2019  . GI bleed 03/08/2019  . Gastrointestinal hemorrhage with melena 03/06/2019  . Acute respiratory failure with hypoxia ([place]) 12/25/2018  . Elevated  troponin 12/14/2018  . Chest pain at rest 07/13/2018  . Hand steal syndrome ([place]) 08/01/2017  . Anemia 07/14/2017  . Coronary artery disease 06/05/2017  . Mesenteric ischemia ([place])   . Diverticulitis   . Enteritis   . Complication of vascular access for dialysis 03/19/2017  . Preoperative clearance 01/25/2017  . H/O non-ST elevation myocardial infarction (NSTEMI) 10/24/2016  . Fluid overload 10/10/2016  . Complication from renal dialysis device 10/10/2016  . Non-ST elevation MI (NSTEMI) ([place])   . Encounter for fitting and adjustment of vascular catheter   . Heme positive stool   . Demand ischemia ([place]) 07/27/2016  . Hypertensive emergency 07/08/2016  . Acute on chronic respiratory failure with hypoxia ([place])   . Cardiac arrest ([place])   . Palliative care encounter   . Goals of care, counseling/discussion   . Hypertensive crisis without congestive heart failure 05/09/2016  . Flash pulmonary edema ([place]) 04/06/2016  . Acute respiratory failure ([place]) 04/06/2016  . Hypertensive crisis 01/27/2016  . History of colon cancer 01/27/2016  . History of ovarian cancer 01/27/2016  . Hypertensive urgency 01/27/2016  . Paroxysmal atrial fibrillation ([place]) 10/14/2015  . Coronary angioplasty status 10/14/2015  . Malignant neoplasm of right ovary ([place]) 10/14/2015  . Narrow complex tachycardia ([place]) 09/08/2015  . SVT (supraventricular tachycardia) ([place]) 09/08/2015  . Influenza A 08/30/2015  . Acute on chronic diastolic CHF (congestive heart failure) ([place]) 05/04/2015  . Unstable angina ([place]) 05/03/2015  . DOE (dyspnea on exertion)   . Essential hypertension   . Pain in joint, lower leg 08/14/2014  . Dacryocystitis 05/29/2013  . Chronic diastolic CHF (congestive heart failure) ([place]) 03/22/2013  .  GI bleeding 03/21/2013  . Acute blood loss anemia 03/21/2013  . Occlusion and stenosis of carotid artery without mention of cerebral infarction 01/24/2013  . Hx of CABG 07/05/2012  . Carotid artery  disease ([place]) 07/05/2012  . Mitral regurgitation 06/12/2012  . Pneumonia 06/09/2012  . Non-STEMI (non-ST elevated myocardial infarction) ([place]) 06/08/2012  . Ischemic chest pain ([place]) 03/01/2012  . 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 GERD (gastroesophageal reflux disease) 01/09/2012  . HLD (hyperlipidemia) 01/05/2012  . Atherosclerotic heart disease of native coronary artery without angina pectoris 12/16/2011  . Essential hypertension, benign 12/16/2011  . ESRD on hemodialysis ([place]) 12/16/2011  . Anxiety disorder 05/04/2011  . Anemia in chronic kidney disease 04/29/2011  . ESRD (end stage renal disease) on dialysis ([place]) 04/29/2011  . Gout 04/29/2011  . Hypertensive chronic kidney disease with stage 5 chronic kidney disease or end stage renal disease ([place]) 04/29/2011    Orientation RESPIRATION BLADDER Height & Weight     Self  Normal Continent Weight: 133 lb 6.1 oz (60.5 kg) Height:     BEHAVIORAL SYMPTOMS/MOOD NEUROLOGICAL BOWEL NUTRITION STATUS      Continent Diet (Please see DC Summary)  AMBULATORY STATUS COMMUNICATION OF NEEDS Skin   Limited Assist Verbally Normal                       Personal Care Assistance Level of Assistance  Bathing,Feeding,Dressing Bathing Assistance: Maximum assistance Feeding assistance: Independent Dressing Assistance: Limited assistance     Functional Limitations Info  Sight Sight Info: Impaired        SPECIAL CARE FACTORS FREQUENCY  PT (By licensed PT),OT (By licensed OT)     PT Frequency: 5x/week OT Frequency: 5x/week            Contractures Contractures Info: Not present    Additional Factors Info  Code Status,Allergies Code Status Info: Full Allergies Info: Aspirin, Penicillins, Amlodipine, Bactrim (Sulfamethoxazole-trimethoprim), Contrast Media (Iodinated Diagnostic Agents), Iron, Nitrofurantoin, Tylenol (Acetaminophen), Gabapentin, Hydralazine, Levofloxacin, No Known Allergies,  Ranexa (Ranolazine), Dexilant (Dexlansoprazole), Levaquin (Levofloxacin In D5w), Morphine And Related, Plavix (Clopidogrel Bisulfate), Protonix (Pantoprazole Sodium), Venofer (Ferric Oxide)           Current Medications (12/04/2020):  This is the current hospital active medication list Current Facility-Administered Medications  Medication Dose Route Frequency Provider Last Rate Last Admin  . aspirin EC tablet 81 mg  81 mg Oral Daily Howerter, Justin B, DO   81 mg at 12/04/20 0915  . Chlorhexidine Gluconate Cloth 2 % PADS 6 each  6 each Topical Q0600 Dwana Melena, MD   6 each at 12/04/20 0915  . hydrOXYzine (ATARAX/VISTARIL) tablet 100 mg  100 mg Oral BID PRN Howerter, Justin B, DO   100 mg at 12/04/20 0402  . isosorbide mononitrate (IMDUR) 24 hr tablet 60 mg  60 mg Oral BID Howerter, Justin B, DO   60 mg at 12/04/20 0915  . sevelamer carbonate (RENVELA) tablet 1,600 mg  1,600 mg Oral TID WC Howerter, Justin B, DO   1,600 mg at 12/04/20 0915     Discharge Medications: Please see discharge summary for a list of discharge medications.  Relevant Imaging Results:  Relevant Lab Results:   Additional Information ss#[id number].  Dialysis patient MWF at [facility] (~10:30am). Vaccinated and boosted  [employer], [place]

## 2020-12-04 NOTE — Progress Notes (Addendum)
Neurology Progress Note  S: Patient is a poor historian. She states she is feeling somewhat better. States she walked a few steps with PT yesterday. It is difficult to discern exactly when she started to feel weak with ambulating. She states that one month ago she could "go, go, go" around the house and did not require any help with ADLs. States she thinks she has required her husband's help for past 2 weeks. States she is sore from her fall at home and her left ankle hurts. No other complaints.   O: Current vital signs: BP 124/87 (BP Location: Right Arm)   Pulse 69   Temp 98.3 F (36.8 C) (Oral)   Resp 18   Wt 60.5 kg   SpO2 99%   BMI 25.20 kg/m  Vital signs in last 24 hours: Temp:  [98.3 F (36.8 C)-98.5 F (36.9 C)] 98.3 F (36.8 C) (05/27 0426) Pulse Rate:  [62-75] 69 (05/27 0426) Resp:  [12-22] 18 (05/27 0426) BP: (124-190)/(36-88) 124/87 (05/27 0426) SpO2:  [91 %-100 %] 99 % (05/27 0426) Weight:  [60.5 kg] 60.5 kg (05/27 0426)  GENERAL: Awake, alert in NAD. HEENT: Normocephalic and atraumatic. LUNGS: Normal respiratory effort.  CV: RRR.  Ext: warm. Left ankle at lateral malleolous is tender on palpation.   NEURO:  Mental Status:Awake and alert. Oriented to self, place, year, but not month or date.  Speech/Language: speech is without aphasia or dysarthria.  Naming, repetition, fluency, and comprehension intact.  Cranial Nerves:  II: PERRL. Visual fields full.  III, IV, VI: EOMI. Eyelids elevate symmetrically.  V: Sensation is intact to light touch and symmetrical to face.  VII: Smile is symmetrical. Able to puff cheeks and raise eyebrows.  VIII: hearing intact to voice. IX, X: Palate elevates symmetrically. Phonation is normal.  XI: Shoulder shrug 5/5. XII: tongue is midline without fasciculations. Motor: RUE: biceps 4+    Triceps: 4-/5                 RLE: thigh: 4       Knee: 4/5       Plantar flexion: 4+     Dorsiflexion: 4+            LUE: biceps 4/5   Triceps:  4-/5            LLE: thigh 4-/5    Knee 4+/5     Plantar flexion: 4+/5     Dorsiflexion: 4+/5 Tone: bulk decreased. [place].  Sensation- Intact to light touch bilaterally. Extinction absent to light touch to DSS.    Coordination: FTN intact bilaterally. No drift. No tremors. HKS: unable to perform due to weakness and pain.  DTRs: Bilateral brachioradialis  2+     Biceps 2+    Patellar: 1+  Gait- deferred  PT evaluation summary: Recommend SNF with 24 hour supervision. Wheelchair.   Medications  Current Facility-Administered Medications:  .  aspirin EC tablet 81 mg, 81 mg, Oral, Daily, Howerter, Justin B, DO, 81 mg at 12/03/20 1022 .  Chlorhexidine Gluconate Cloth 2 % PADS 6 each, 6 each, Topical, Q0600, Dwana Melena, MD .  hydrOXYzine (ATARAX/VISTARIL) tablet 100 mg, 100 mg, Oral, BID PRN, Howerter, Justin B, DO, 100 mg at 12/04/20 0402 .  isosorbide mononitrate (IMDUR) 24 hr tablet 60 mg, 60 mg, Oral, BID, Howerter, Justin B, DO, 60 mg at 12/03/20 2220 .  sevelamer carbonate (RENVELA) tablet 1,600 mg, 1,600 mg, Oral, TID WC, Howerter, Justin B, DO, 1,600 mg at  12/03/20 1415  Pertinent Labs CK 41   Adolase 7.6      ANA pending     CRP 0.8     ESR 25    TSH 9.193     Free T4  .98     B12  791   Folate 54.                Imaging MD has reviewed images in epic and the results pertinent to this consultation are:  CT Head No acute intracranial pathology. Moderate age-related atrophy and chronic microvascular ischemic changes.  MRI Brain  No acute intracranial abnormality. Age-related cerebral atrophy with advanced chronic microvascular ischemic disease, with a few scattered remote lacunar infarcts involving the left basal ganglia and left cerebellum.  Assessment: 80 yo female with 1-2 week decline in overall activity but especially weakness to LEs with multiple falls at home. Neurology with concern for myopathy but exam is not consistent with cord compression. Given her increased  need of help with ADLs, multiple falls, and sedentary lifestyle, deconditioning is definitely a contributor to her presentation. Myositis is low on differential given negative inflammatory markers on blood work. ANA still pending. We have taken her off her statin given associated muscle issues to LEs with statins.    Recommendations:  -Continue to hold statin and see how she responds in next 2-3 weeks.  -Continue PT-recommendations for 24 hr supervision and SNF.  -f/up ANA result.  -chatted with attending about possibility of dementia. Needs formal neuropsych testing after discharge. -left ankle tenderness on exam. Chat message sent to hospitalist.  Pt seen by Clance Boll, MSN, APN-BC/Nurse Practitioner/Neuro. Pager: [phone]

## 2020-12-04 NOTE — Progress Notes (Signed)
After giving a report to a "new dialysis travel RN" another RN on the floor called and said she was told that the dialysis rn was unable to get report, after she was already given report.

## 2020-12-04 NOTE — Evaluation (Signed)
Occupational Therapy Evaluation Patient Details Name: Shawna [name] MRN: [id number] DOB: 02/25/1941 Today's Date: 12/04/2020    History of Present Illness Pt is an 80 y/o female admitted 5/25 secondary to inability to walk and BLE weakness (LLE>RLE). Thought to be secondary to myopathy per neuro notes. PMH includes ESRD on HD, HTN, a fib, CAD, dCHF.   Clinical Impression   Pt was living with her husband and ambulating with a cane prior to admission. He assisted her as needed with ADL and they worked together on housekeeping and cooking. Pt presents with impaired cognition, generalized weakness and impaired standing balance. She need moderate assistance for bed mobility and transferred x 2 with min assist. Pt needs set up to total assist for ADL. Recommending SNF for further rehab prior to returning home.     Follow Up Recommendations  SNF;Supervision/Assistance - 24 hour    Equipment Recommendations  None recommended by OT    Recommendations for Other Services       Precautions / Restrictions Precautions Precautions: Fall Precaution Comments: Has had multiple falls at home.      Mobility Bed Mobility Overal bed mobility: Needs Assistance Bed Mobility: Supine to Sit     Supine to sit: Mod assist     General bed mobility comments: increased time, assist for LEs over EOB, to raise trunk and for hips to EOB    Transfers Overall transfer level: Needs assistance Equipment used: Rolling walker (2 wheeled);1 person hand held assist Transfers: Sit to/from [facility] Sit to Stand: Min assist Stand pivot transfers: Min assist       General transfer comment: assist to rise and steady    Balance Overall balance assessment: Needs assistance   Sitting balance-Leahy Scale: Fair       Standing balance-Leahy Scale: Poor                             ADL either performed or assessed with clinical judgement   ADL Overall ADL's : Needs  assistance/impaired Eating/Feeding: Set up;Sitting   Grooming: Wash/dry hands;Sitting;Set up   Upper Body Bathing: Moderate assistance;Sitting   Lower Body Bathing: Maximal assistance;Sit to/from stand   Upper Body Dressing : Moderate assistance;Sitting   Lower Body Dressing: Total assistance;Bed level   Toilet Transfer: Minimal assistance;Stand-pivot;BSC   Toileting- Clothing Manipulation and Hygiene: Total assistance;Sit to/from stand               Vision Baseline Vision/History: Wears glasses Wears Glasses: At all times Patient Visual Report: No change from baseline       Perception     Praxis      Pertinent Vitals/Pain Pain Assessment: No/denies pain     Hand Dominance Right   Extremity/Trunk Assessment Upper Extremity Assessment Upper Extremity Assessment: Generalized weakness   Lower Extremity Assessment Lower Extremity Assessment: Defer to PT evaluation   Cervical / Trunk Assessment Cervical / Trunk Assessment: Kyphotic   Communication Communication Communication: Expressive difficulties   Cognition Arousal/Alertness: Awake/alert Behavior During Therapy: Flat affect Overall Cognitive Status: No family/caregiver present to determine baseline cognitive functioning                                 General Comments: tangential, falls asleep when not stimulated, unable to use her flip phone without max assist,unaware of how to call for help   General Comments  Exercises     Shoulder Instructions      Home Living Family/patient expects to be discharged to:: Private residence Living Arrangements: Spouse/significant other Available Help at Discharge: Family Type of Home: House Home Access: Stairs to enter Technical brewer of Steps: 2 Entrance Stairs-Rails: Right Home Layout: One level     Bathroom Shower/Tub: Teacher, early years/pre: [place] - single point;Walker - 4 wheels           Prior Functioning/Environment Level of Independence: Needs assistance  Gait / Transfers Assistance Needed: uses cane for ambulation ADL's / Homemaking Assistance Needed: Assist times for bathing and dressing. Pt completes some cooking and cleaning per report. Husband also assists.            OT Problem List: Decreased strength;Decreased activity tolerance;Impaired balance (sitting and/or standing);Decreased knowledge of use of DME or AE;Decreased cognition;Decreased safety awareness      OT Treatment/Interventions: Self-care/ADL training;DME and/or AE instruction;Therapeutic activities;Patient/family education;Balance training    OT Goals(Current goals can be found in the care plan section) Acute Rehab OT Goals Patient Stated Goal: to go home OT Goal Formulation: With patient Time For Goal Achievement: 12/17/20 Potential to Achieve Goals: Good ADL Goals Pt Will Perform Grooming: standing;with min guard assist Pt Will Perform Upper Body Bathing: with min assist;sitting Pt Will Perform Upper Body Dressing: with set-up;sitting Pt Will Transfer to Toilet: with min guard assist;ambulating;bedside commode (over toilet) Pt Will Perform Toileting - Clothing Manipulation and hygiene: with min guard assist;sit to/from stand Additional ADL Goal #1: Pt will perform bed mobility with supervision in preparation for ADL. Additional ADL Goal #2: (P) Pt will demonstrate improved memory by demonstrating awareness of how to call for nursing assistance.  OT Frequency: Min 2X/week   Barriers to D/C:            Co-evaluation              AM-PAC OT "6 Clicks" Daily Activity     Outcome Measure Help from another person eating meals?: A Little Help from another person taking care of personal grooming?: A Little Help from another person toileting, which includes using toliet, bedpan, or urinal?: Total Help from another person bathing (including washing, rinsing, drying)?: A Lot Help  from another person to put on and taking off regular upper body clothing?: A Lot Help from another person to put on and taking off regular lower body clothing?: Total 6 Click Score: 12   End of Session Equipment Utilized During Treatment: Rolling walker;Gait belt Nurse Communication: Mobility status (had BM)  Activity Tolerance: Patient tolerated treatment well Patient left: in chair;with call bell/phone within reach;with chair alarm set  OT Visit Diagnosis: Unsteadiness on feet (R26.81);Muscle weakness (generalized) (M62.81);Other symptoms and signs involving cognitive function                Time: 6767-2094 OT Time Calculation (min): 41 min Charges:  OT General Charges $OT Visit: 1 Visit OT Evaluation $OT Eval Moderate Complexity: 1 Mod OT Treatments $Self Care/Home Management : 23-37 mins  Nestor Lewandowsky, OTR/L Acute Rehabilitation Services Pager: [phone] Office: [phone]  Malka So 12/04/2020, 12:49 PM

## 2020-12-04 NOTE — Consult Note (Addendum)
Reason for Consult: ESRD Referring Physician:  Dr. Cordelia Poche  Chief Complaint: Unsteadiness on feet  Dialysis Orders: [facility]. EDW61kgHD Bath 2K/2.5CaTime 3.5 hrsHeparin none. AccessLUE AVFBFR  Nipro Cellentia 17h Blood flow 300DFR 500   Hectoral 14mcg IV/HD Epogen 8000Units IV/HDgiven 12/02/20  Assessment/Plan: 1. ESRD- plan for outpatient HD today 2. Hypertension/volume- h/o [place] with medications and has come in w/ hypertensive urgency. Resume  HD and meds.   3. Anemia- will dose with aranesp as needed 4. Metabolic bone disease- resume priormeds and will check a phos level. 5. Weakness - neurology on board; suspecting statin but she has also been on for many years. Recent admission for weakness which improved with discontinuation of gabapentin + RRT. 6. Nutrition- renal diet 7. H/o myoclonus - Do not resume gabapentin as likely source of myoclonus and leg weakness -> improved after gabapentin was stopped last admission + RRT.  8. DM type 2 - per primary 9. Vascular access - have had issues with avf in past and will monitor w/ tx today  HPI: Shawna [name] is an 80 y.o. female pAfib HTN HLD h/o TIA, recent COVID infection dCHF CASHD w/p CABG in 2013 with PCI as well in 2018 with DES TO D1 in 10/2016, ESRD MWF [facility] presenting with being unsteady standing noting myalgias in anterior aspect of thighs as well + symmetrical weakness and difficulty ambulating. She denied any upper extremity weakness, falls or trauma.  She also denies any fever, chills, headache, "room spinning" or lightheadedness. She has been on simvastatin for many years and is on amiodarone for her afib. She was admitted recently earlier this month w/ also myoclonus and weakness at which time the neurontin was discontinued. Her myoclonus actually improved after HD and SNF was recommended but the patient refused.   ROS Pertinent items are noted in HPI.  Chemistry and  CBC: Creat  Date/Time Value Ref Range Status  03/05/2019 11:59 AM 6.57 (H) 0.60 - 0.93 mg/dL Final    Comment:    For patients >63 years of age, the reference limit for Creatinine is approximately 13% higher for people identified as African-American. .    Creatinine, Ser  Date/Time Value Ref Range Status  12/03/2020 02:01 AM 4.81 (H) 0.44 - 1.00 mg/dL Final  11/11/2020 06:18 AM 6.64 (H) 0.44 - 1.00 mg/dL Final    Comment:    DELTA CHECK NOTED  11/10/2020 09:33 AM 12.01 (H) 0.44 - 1.00 mg/dL Final  11/09/2020 03:13 PM 9.90 (H) 0.44 - 1.00 mg/dL Final  10/07/2020 11:49 AM 9.73 (H) 0.44 - 1.00 mg/dL Final  10/05/2020 12:19 PM 11.12 (H) 0.44 - 1.00 mg/dL Final  10/04/2020 04:26 AM 7.48 (H) 0.44 - 1.00 mg/dL Final  10/03/2020 12:25 PM 10.03 (H) 0.44 - 1.00 mg/dL Final  09/29/2020 01:12 AM 5.44 (H) 0.44 - 1.00 mg/dL Final  09/01/2020 05:57 AM 5.02 (H) 0.44 - 1.00 mg/dL Final  08/31/2020 06:30 AM 9.83 (H) 0.44 - 1.00 mg/dL Final  08/30/2020 11:08 PM 8.68 (H) 0.44 - 1.00 mg/dL Final  08/10/2020 04:00 PM 4.85 (H) 0.44 - 1.00 mg/dL Final  06/20/2020 05:51 AM 5.18 (H) 0.44 - 1.00 mg/dL Final  06/19/2020 02:52 AM 7.44 (H) 0.44 - 1.00 mg/dL Final  06/18/2020 03:38 AM 13.37 (H) 0.44 - 1.00 mg/dL Final  06/17/2020 01:16 AM 9.87 (H) 0.44 - 1.00 mg/dL Final  06/16/2020 03:44 AM 7.76 (H) 0.44 - 1.00 mg/dL Final    Comment:    DELTA CHECK NOTED  06/15/2020  05:17 AM 11.65 (H) 0.44 - 1.00 mg/dL Final  06/14/2020 01:54 AM 8.48 (H) 0.44 - 1.00 mg/dL Final  06/13/2020 03:52 AM 10.26 (H) 0.44 - 1.00 mg/dL Final  06/12/2020 03:05 PM 9.26 (H) 0.44 - 1.00 mg/dL Final  05/07/2020 05:41 AM 4.79 (H) 0.44 - 1.00 mg/dL Final  05/06/2020 01:19 PM 7.85 (H) 0.44 - 1.00 mg/dL Final  02/12/2020 05:12 PM 4.84 (H) 0.44 - 1.00 mg/dL Final  12/13/2019 03:56 PM 4.52 (H) 0.44 - 1.00 mg/dL Final  11/22/2019 02:58 AM 8.08 (H) 0.44 - 1.00 mg/dL Final  10/14/2019 01:50 PM 10.84 (H) 0.44 - 1.00 mg/dL Final  09/27/2019  09:25 PM 5.03 (H) 0.44 - 1.00 mg/dL Final  06/19/2019 04:27 AM 7.01 (H) 0.44 - 1.00 mg/dL Final  06/18/2019 04:27 AM 11.94 (H) 0.44 - 1.00 mg/dL Final  06/17/2019 02:29 PM 10.49 (H) 0.44 - 1.00 mg/dL Final  06/05/2019 05:50 AM 9.37 (H) 0.44 - 1.00 mg/dL Final  06/04/2019 05:01 AM 6.97 (H) 0.44 - 1.00 mg/dL Final  06/03/2019 03:56 PM 5.96 (H) 0.44 - 1.00 mg/dL Final  05/17/2019 06:33 PM 4.20 (H) 0.44 - 1.00 mg/dL Final  03/09/2019 04:44 AM 5.28 (H) 0.44 - 1.00 mg/dL Final    Comment:    DELTA CHECK NOTED  03/08/2019 09:54 AM 7.98 (H) 0.44 - 1.00 mg/dL Final  03/08/2019 09:54 AM 7.99 (H) 0.44 - 1.00 mg/dL Final  03/07/2019 11:56 PM 6.89 (H) 0.44 - 1.00 mg/dL Final  03/02/2019 12:03 PM 6.61 (H) 0.44 - 1.00 mg/dL Final  12/26/2018 08:20 PM 3.90 (H) 0.44 - 1.00 mg/dL Final    Comment:    DIALYSIS  12/26/2018 02:45 PM 8.99 (H) 0.44 - 1.00 mg/dL Final  12/25/2018 10:16 PM 7.47 (H) 0.44 - 1.00 mg/dL Final  12/15/2018 04:03 AM 4.75 (H) 0.44 - 1.00 mg/dL Final    Comment:    DELTA CHECK NOTED  12/14/2018 04:11 PM 9.20 (H) 0.44 - 1.00 mg/dL Final  12/14/2018 04:59 AM 7.93 (H) 0.44 - 1.00 mg/dL Final  12/14/2018 01:43 AM 7.62 (H) 0.44 - 1.00 mg/dL Final  07/14/2018 02:00 AM 9.94 (H) 0.44 - 1.00 mg/dL Final  07/13/2018 02:03 AM 7.98 (H) 0.44 - 1.00 mg/dL Final  02/13/2018 12:08 PM 6.88 (H) 0.44 - 1.00 mg/dL Final  12/20/2017 12:26 AM 7.38 (H) 0.44 - 1.00 mg/dL Final   Recent Labs  Lab 12/03/20 0201  NA 136  K 3.8  CL 97*  CO2 29  GLUCOSE 140*  BUN 12  CREATININE 4.81*  CALCIUM 10.0   Recent Labs  Lab 12/02/20 1746 12/03/20 0201  WBC 6.0 7.9  NEUTROABS 4.0  --   HGB 10.6* 8.1*  HCT 35.0* 26.5*  MCV 120.7* 119.9*  PLT 135* 281   Liver Function Tests: No results for input(s): AST, ALT, ALKPHOS, BILITOT, PROT, ALBUMIN in the last 168 hours. No results for input(s): LIPASE, AMYLASE in the last 168 hours. No results for input(s): AMMONIA in the last 168 hours. Cardiac  Enzymes: Recent Labs  Lab 12/03/20 0201  CKTOTAL 47   Iron Studies: No results for input(s): IRON, TIBC, TRANSFERRIN, FERRITIN in the last 72 hours. PT/INR: @LABRCNTIP (inr:5)  Xrays/Other Studies: ) Results for orders placed or performed during the hospital encounter of 12/02/20 (from the past 48 hour(s))  CBC with Differential     Status: Abnormal   Collection Time: 12/02/20  5:46 PM  Result Value Ref Range   WBC 6.0 4.0 - 10.5 K/uL   RBC 2.90 (L) 3.87 -  5.11 MIL/uL   Hemoglobin 10.6 (L) 12.0 - 15.0 g/dL   HCT 35.0 (L) 36.0 - 46.0 %   MCV 120.7 (H) 80.0 - 100.0 fL   MCH 36.6 (H) 26.0 - 34.0 pg   MCHC 30.3 30.0 - 36.0 g/dL   RDW 17.1 (H) 11.5 - 15.5 %   Platelets 135 (L) 150 - 400 K/uL   nRBC 0.0 0.0 - 0.2 %   Neutrophils Relative % 67 %   Neutro Abs 4.0 1.7 - 7.7 K/uL   Lymphocytes Relative 17 %   Lymphs Abs 1.0 0.7 - 4.0 K/uL   Monocytes Relative 8 %   Monocytes Absolute 0.5 0.1 - 1.0 K/uL   Eosinophils Relative 6 %   Eosinophils Absolute 0.4 0.0 - 0.5 K/uL   Basophils Relative 1 %   Basophils Absolute 0.1 0.0 - 0.1 K/uL   Immature Granulocytes 1 %   Abs Immature Granulocytes 0.03 0.00 - 0.07 K/uL   Acanthocytes PRESENT    Polychromasia PRESENT     Comment: Performed at [facility], [address] [place], [place] [address]  SARS CORONAVIRUS 2 (TAT 6-24 HRS) Nasopharyngeal Nasopharyngeal Swab     Status: None   Collection Time: 12/02/20 11:18 PM   Specimen: Nasopharyngeal Swab  Result Value Ref Range   SARS Coronavirus 2 NEGATIVE NEGATIVE    Comment: (NOTE) SARS-CoV-2 target nucleic acids are NOT DETECTED.  The SARS-CoV-2 RNA is generally detectable in upper and lower respiratory specimens during the acute phase of infection. Negative results do not preclude SARS-CoV-2 infection, do not rule out co-infections with other pathogens, and should not be used as the sole basis for treatment or other patient management decisions. Negative results must be  combined with clinical observations, patient history, and epidemiological information. The expected result is Negative.  Fact Sheet for Patients: SugarRoll.be  Fact Sheet for Healthcare Providers: https://www.woods-mathews.com/  This test is not yet approved or cleared by the [place] FDA and  has been authorized for detection and/or diagnosis of SARS-CoV-2 by FDA under an Emergency Use Authorization (EUA). This EUA will remain  in effect (meaning this test can be used) for the duration of the COVID-19 declaration under Se ction 564(b)(1) of the Act, 21 U.S.C. section 360bbb-3(b)(1), unless the authorization is terminated or revoked sooner.  Performed at [facility], [place] [address], [place], [place] [address]   Magnesium     Status: None   Collection Time: 12/03/20  2:01 AM  Result Value Ref Range   Magnesium 1.9 1.7 - 2.4 mg/dL    Comment: Performed at [facility], [place] [address], [place], [place] [address]  Basic metabolic panel     Status: Abnormal   Collection Time: 12/03/20  2:01 AM  Result Value Ref Range   Sodium 136 135 - 145 mmol/L   Potassium 3.8 3.5 - 5.1 mmol/L   Chloride 97 (L) 98 - 111 mmol/L   CO2 29 22 - 32 mmol/L   Glucose, Bld 140 (H) 70 - 99 mg/dL    Comment: Glucose reference range applies only to samples taken after fasting for at least 8 hours.   BUN 12 8 - 23 mg/dL   Creatinine, Ser 4.81 (H) 0.44 - 1.00 mg/dL   Calcium 10.0 8.9 - 10.3 mg/dL   GFR, Estimated 9 (L) >60 mL/min    Comment: (NOTE) Calculated using the CKD-EPI Creatinine Equation (2021)    Anion gap 10 5 - 15  Comment: Performed at [facility], [place] [address], [place], [place] [address]  CBC     Status: Abnormal   Collection Time: 12/03/20  2:01 AM  Result Value Ref Range   WBC 7.9 4.0 - 10.5 K/uL   RBC 2.21 (L) 3.87 - 5.11 MIL/uL   Hemoglobin 8.1 (L) 12.0 - 15.0 g/dL   HCT 26.5 (L) 36.0 - 46.0 %   MCV 119.9 (H)  80.0 - 100.0 fL   MCH 36.7 (H) 26.0 - 34.0 pg   MCHC 30.6 30.0 - 36.0 g/dL   RDW 17.5 (H) 11.5 - 15.5 %   Platelets 281 150 - 400 K/uL    Comment: REPEATED TO VERIFY DELTA CHECK NOTED    nRBC 0.0 0.0 - 0.2 %    Comment: Performed at [facility], [place] [address] [place], [place] [address]  CK     Status: None   Collection Time: 12/03/20  2:01 AM  Result Value Ref Range   Total CK 47 38 - 234 U/L    Comment: Performed at [facility], [place] [address], [place], [place] [address]  C-reactive protein     Status: None   Collection Time: 12/03/20  2:01 AM  Result Value Ref Range   CRP 0.8 <1.0 mg/dL    Comment: Performed at [place] [address] [place], [place] [address]  Sedimentation rate     Status: Abnormal   Collection Time: 12/03/20  2:01 AM  Result Value Ref Range   Sed Rate 25 (H) 0 - 22 mm/hr    Comment: Performed at [place] [address], [place], [place] [address]  TSH     Status: Abnormal   Collection Time: 12/03/20  2:01 AM  Result Value Ref Range   TSH 9.193 (H) 0.350 - 4.500 uIU/mL    Comment: Performed by a 3rd Generation assay with a functional sensitivity of <=0.01 uIU/mL. Performed at [facility], [place] [address] [place], [place] [address]   Folate     Status: None   Collection Time: 12/03/20  2:01 AM  Result Value Ref Range   Folate 54.0 >5.9 ng/mL    Comment: RESULTS CONFIRMED BY MANUAL DILUTION Performed at [facility], [place] [address], [place], [place] [address]   Vitamin B12     Status: None   Collection Time: 12/03/20  2:01 AM  Result Value Ref Range   Vitamin B-12 791 180 - 914 pg/mL    Comment: (NOTE) This assay is not validated for testing neonatal or myeloproliferative syndrome specimens for Vitamin B12 levels. Performed at [facility], [place] [address] [place], [place] [address]   T4, free     Status: None   Collection Time: 12/03/20 10:18 AM  Result Value Ref Range   Free T4 0.98  0.61 - 1.12 ng/dL    Comment: (NOTE) Biotin ingestion may interfere with free T4 tests. If the results are inconsistent with the TSH level, previous test results, or the clinical presentation, then consider biotin interference. If needed, order repeat testing after stopping biotin. Performed at [facility], [place] [address], [place], [place] [address]    *Note: Due to a large number of results and/or encounters for the requested time period, some results have not been displayed. A complete set of results can be found in Results Review.   CT Head Wo Contrast  Result Date: 12/02/2020 CLINICAL DATA:  80 year old female with head trauma. EXAM: CT HEAD WITHOUT CONTRAST TECHNIQUE: Contiguous axial images were obtained from the base of the skull through the vertex without intravenous contrast. COMPARISON:  Head CT dated 11/09/2020. FINDINGS: Brain: Moderate age-related atrophy and chronic microvascular ischemic changes. There is no acute intracranial hemorrhage. No mass effect or midline shift. No extra-axial fluid collection. Vascular: No hyperdense vessel or unexpected calcification. Skull: Normal. Negative for fracture or focal lesion. Sinuses/Orbits: Small retention cyst or polyp in the left sphenoid sinus. The remainder of the visualized paranasal sinuses and mastoid air cells are clear. No air-fluid level. Partially visualized cystic area in the right maxilla at the right maxillary central incisor root. This may represent an area of periapical lucency or a dentigerous cyst. Correlation with dental exam recommended. Other: None IMPRESSION: 1. No acute intracranial pathology. 2. Moderate age-related atrophy and chronic microvascular ischemic changes. Electronically Signed   By: Anner Crete M.D.   On: 12/02/2020 19:25   MR Brain Wo Contrast (neuro protocol)  Result Date: 12/02/2020 CLINICAL DATA:  Initial evaluation for acute dizziness. EXAM: MRI HEAD WITHOUT CONTRAST TECHNIQUE: Multiplanar,  multiecho pulse sequences of the brain and surrounding structures were obtained without intravenous contrast. COMPARISON:  Prior CT from earlier the same day as well as recent MRI from 11/10/2020. FINDINGS: Brain: Examination degraded by motion artifact. Generalized age-related cerebral atrophy. Fairly severe chronic microvascular ischemic disease noted involving the periventricular deep white matter both cerebral hemispheres, with more mild patchy involvement of the pons. Remote lacunar infarct at the left basal ganglia. Few additional small remote left cerebellar infarcts noted. No convincing foci of restricted diffusion to suggest acute or subacute ischemia. Gray-white matter differentiation maintained. No encephalomalacia to suggest chronic cortical infarction. No acute intracranial hemorrhage. Few scattered chronic micro hemorrhages noted about the cerebellum and right cerebral hemisphere, likely small vessel related. No mass lesion, midline shift or mass effect. No hydrocephalus or extra-axial fluid collection. Pituitary gland suprasellar region normal. Midline structures intact. Vascular: Major intracranial vascular flow voids are grossly maintained at the skull base. Skull and upper cervical spine: Craniocervical junction within normal limits. Bone marrow signal intensity normal. No focal marrow replacing lesion. No scalp soft tissue abnormality. Sinuses/Orbits: Globes and orbital soft tissues demonstrate no acute finding. Mild mucosal thickening noted within the left sphenoid sinus. Paranasal sinuses are otherwise clear. No mastoid effusion. Other: None. IMPRESSION: 1. No acute intracranial abnormality. 2. Age-related cerebral atrophy with advanced chronic microvascular ischemic disease, with a few scattered remote lacunar infarcts involving the left basal ganglia and left cerebellum. Electronically Signed   By: Jeannine Boga M.D.   On: 12/02/2020 22:43   MR FEMUR LEFT WO CONTRAST  Result Date:  12/03/2020 CLINICAL DATA:  Bilateral upper leg weakness and achiness; onset difficulty walking for approximately 1 month. EXAM: MR OF THE LEFT FEMUR WITHOUT CONTRAST TECHNIQUE: Multiplanar, multisequence MR imaging of the left femur was performed. No intravenous contrast was administered. COMPARISON:  None. FINDINGS: Bones/Joint/Cartilage No fracture, stress change or worrisome lesion is identified. No hip or SI joint effusion. Small bilateral knee joint effusions are noted. Joint spaces about the hips and knees appear preserved. No subchondral cyst formation or edema about any joint. No avascular necrosis of the femoral heads. Ligaments Negative. Muscles and Tendons Intact without tear or strain. No evidence of inflammatory change, atrophy or intramuscular mass. No fluid collection. Soft tissues Mild subcutaneous edema about both upper legs is likely due to dependent change. Imaged intrapelvic contents demonstrate sigmoid diverticulosis. The  patient is status post hysterectomy. IMPRESSION: No acute abnormality or finding to explain the patient's symptoms. Mild bilateral subcutaneous edema is likely due to dependent change. Diverticulosis. Electronically Signed   By: Inge Rise M.D.   On: 12/03/2020 08:31   DG CHEST PORT 1 VIEW  Result Date: 12/03/2020 CLINICAL DATA:  Dyspnea EXAM: PORTABLE CHEST 1 VIEW COMPARISON:  11/09/2020 FINDINGS: The lungs are symmetrically well expanded. Stable left basilar scarring. Tiny left pleural effusion or pleural thickening is unchanged. Coronary artery bypass grafting has been performed. Cardiac size is mildly enlarged, unchanged. Previously noted interstitial pulmonary edema has significantly improved. There is mild persistent pulmonary vascular redistribution of the lung apices in keeping with mild volume overload or early cardiogenic failure. No acute bone abnormality. IMPRESSION: Interval resolution of pulmonary edema with persistent mild central pulmonary vascular  congestion. Stable cardiomegaly. Stable left basilar scarring. Small left pleural effusion or pleural scarring. Electronically Signed   By: Fidela Salisbury MD   On: 12/03/2020 23:36    PMH:   Past Medical History:  Diagnosis Date  . Acute on chronic respiratory failure with hypoxia ([place]) 10/10/2016  . Anxiety   . Arthritis   . AVM (arteriovenous malformation) of colon   . CAD (coronary artery disease)    a. s/p CABG in 2013 b. DES to D1 in 10/2016. c. cath in 07/2018 showing patent grafts with occlusion of D1 at prior stent site and progression of PDA disease --> medical management recommended  . Carotid artery disease ([place])    a. 63-33% LICA, 11/4560   . Chronic anemia   . Chronic bronchitis ([place])   . Chronic diastolic CHF (congestive heart failure) ([place])    a. 02/2012 Echo EF 60-65%, nl wall motion, Gr 1 DD, mod MR  . Colon cancer ([place]) 1992  . Esophageal stricture   . ESRD on hemodialysis ([place])    ESRD due to HTN, started dialysis 2011 and gets HD at [facility] with Dr Hinda Lenis on MWF schedule.  Access is LUA AVF as of Sept 2014.   Marland Kitchen GERD (gastroesophageal reflux disease)   . High cholesterol 12/2011  . History of blood transfusion 07/2011; 12/2011; 01/2012 X 2; 04/2012  . History of gout   . History of lower GI bleeding   . Hypertension   . Iron deficiency anemia   . Mitral regurgitation    a. Moderate by echo, 02/2012  . Myocardial infarction ([place])   . NSVT (nonsustained ventricular tachycardia) ([place])   . Ovarian cancer ([place]) 1992  . PAF (paroxysmal atrial fibrillation) ([place])   . Pneumonia ~ 2009  . PUD (peptic ulcer disease)   . TIA (transient ischemic attack)     PSH:   Past Surgical History:  Procedure Laterality Date  . A/V SHUNTOGRAM Left 03/19/2019   Procedure: A/V SHUNTOGRAM;  Surgeon: Katha Cabal, MD;  Location: [facility];  Service: Cardiovascular;  Laterality: Left;  . ABDOMINAL HYSTERECTOMY  1992  . APPENDECTOMY  06/1990  . AV FISTULA PLACEMENT   07/2009   left upper arm  . AV FISTULA PLACEMENT Right 09/06/2016   Procedure: RIGHT FOREARM ARTERIOVENOUS (AV) GRAFT;  Surgeon: Elam Dutch, MD;  Location: [facility] OR;  Service: Vascular;  Laterality: Right;  . AV FISTULA PLACEMENT N/A 02/24/2017   Procedure: INSERTION OF ARTERIOVENOUS (AV) GORE-TEX GRAFT ARM (BRACHIAL AXILLARY);  Surgeon: Katha Cabal, MD;  Location: [facility] ORS;  Service: Vascular;  Laterality: N/A;  . AVGG REMOVAL Right 09/06/2016  Procedure: REMOVAL OF Right Arm ARTERIOVENOUS GORETEX GRAFT and Vein Patch angioplasty of brachial artery;  Surgeon: Angelia Mould, MD;  Location: [facility];  Service: Vascular;  Laterality: Right;  . BIOPSY  09/26/2019   Procedure: BIOPSY;  Surgeon: Rogene Houston, MD;  [facility];  Service: Endoscopy;;  . COLON RESECTION  1992  . COLON SURGERY    . COLONOSCOPY N/A 03/09/2019   Procedure: COLONOSCOPY;  Surgeon: Rogene Houston, MD;  [facility];  Service: Endoscopy;  Laterality: N/A;  . CORONARY ANGIOPLASTY WITH STENT PLACEMENT  12/15/11   "2"  . CORONARY ANGIOPLASTY WITH STENT PLACEMENT  y/2013   "1; makes total of 3" (05/02/2012)  . CORONARY ARTERY BYPASS GRAFT  06/13/2012   Procedure: CORONARY ARTERY BYPASS GRAFTING (CABG);  Surgeon: Grace Isaac, MD;  Location: [facility];  Service: Open Heart Surgery;  Laterality: N/A;  cabg x four;  using left internal mammary artery, and left leg greater saphenous vein harvested endoscopically  . CORONARY STENT INTERVENTION N/A 10/13/2016   Procedure: Coronary Stent Intervention;  Surgeon: Troy Sine, MD;  Location: [facility];  Service: Cardiovascular;  Laterality: N/A;  . DIALYSIS/PERMA CATHETER REMOVAL N/A 04/18/2017   Procedure: DIALYSIS/PERMA CATHETER REMOVAL;  Surgeon: Katha Cabal, MD;  Location: [facility];  Service: Cardiovascular;  Laterality: N/A;  . DILATION AND CURETTAGE OF UTERUS    . ESOPHAGOGASTRODUODENOSCOPY  01/20/2012   Procedure:  ESOPHAGOGASTRODUODENOSCOPY (EGD);  Surgeon: Ladene Artist, MD,FACG;  Location: [facility] ENDOSCOPY;  Service: Endoscopy;  Laterality: N/A;  . ESOPHAGOGASTRODUODENOSCOPY N/A 03/26/2013   Procedure: ESOPHAGOGASTRODUODENOSCOPY (EGD);  Surgeon: Irene Shipper, MD;  Location: [facility] ENDOSCOPY;  Service: Endoscopy;  Laterality: N/A;  . ESOPHAGOGASTRODUODENOSCOPY N/A 04/30/2015   Procedure: ESOPHAGOGASTRODUODENOSCOPY (EGD);  Surgeon: Rogene Houston, MD;  [facility];  Service: Endoscopy;  Laterality: N/A;  1pm - moved to 10/20 @ 1:10  . ESOPHAGOGASTRODUODENOSCOPY N/A 07/29/2016   Procedure: ESOPHAGOGASTRODUODENOSCOPY (EGD);  Surgeon: Manus Gunning, MD;  Location: [place] [place];  Service: Gastroenterology;  Laterality: N/A;  enteroscopy  . ESOPHAGOGASTRODUODENOSCOPY N/A 09/26/2019   Procedure: ESOPHAGOGASTRODUODENOSCOPY (EGD);  Surgeon: Rogene Houston, MD;  [facility];  Service: Endoscopy;  Laterality: N/A;  1250  . GIVENS CAPSULE STUDY N/A 03/07/2019   Procedure: GIVENS CAPSULE STUDY;  Surgeon: Rogene Houston, MD;  [facility];  Service: Endoscopy;  Laterality: N/A;  7:30  . INSERTION OF DIALYSIS CATHETER N/A 10/05/2020   Procedure: ABORTED TUNNELED DIALYSIS CATHETER PLACEMENT RIGHT INTERNAL JUGULAR VEIN ;  Surgeon: Virl Cagey, MD;  [facility];  Service: General;  Laterality: N/A;  . INTRAOPERATIVE TRANSESOPHAGEAL ECHOCARDIOGRAM  06/13/2012   Procedure: INTRAOPERATIVE TRANSESOPHAGEAL ECHOCARDIOGRAM;  Surgeon: Grace Isaac, MD;  Location: [facility];  Service: Open Heart Surgery;  Laterality: N/A;  . IR DIALY SHUNT INTRO NEEDLE/INTRACATH INITIAL W/IMG LEFT Left 10/06/2020  . IR FLUORO GUIDE CV LINE RIGHT  06/17/2020  . IR GENERIC HISTORICAL  07/26/2016   IR FLUORO GUIDE CV LINE RIGHT 07/26/2016 Greggory Keen, MD [facility]  . IR GENERIC HISTORICAL  07/26/2016   IR US GUIDE VASC ACCESS RIGHT 07/26/2016 Greggory Keen, MD [facility]  . IR GENERIC HISTORICAL   08/02/2016   IR US GUIDE VASC ACCESS RIGHT 08/02/2016 Greggory Keen, MD [facility]  . IR GENERIC HISTORICAL  08/02/2016   IR FLUORO GUIDE CV LINE RIGHT 08/02/2016 Greggory Keen, MD [facility]  . IR RADIOLOGY  PERIPHERAL GUIDED IV START  03/28/2017  . IR REMOVAL TUN CV CATH W/O FL  08/11/2020  . IR THROMBECTOMY AV FISTULA W/THROMBOLYSIS INC/SHUNT/IMG LEFT Left 06/17/2020  . IR US GUIDE VASC ACCESS LEFT  06/17/2020  . IR US GUIDE VASC ACCESS RIGHT  03/28/2017  . IR US GUIDE VASC ACCESS RIGHT  06/17/2020  . LEFT HEART CATH AND CORONARY ANGIOGRAPHY N/A 09/20/2016   Procedure: Left Heart Cath and Coronary Angiography;  Surgeon: [name], MD;  Location: [facility];  Service: Cardiovascular;  Laterality: N/A;  . LEFT HEART CATH AND CORS/GRAFTS ANGIOGRAPHY N/A 10/13/2016   Procedure: Left Heart Cath and Cors/Grafts Angiography;  Surgeon: Troy Sine, MD;  Location: [place] [facility];  Service: Cardiovascular;  Laterality: N/A;  . LEFT HEART CATH AND CORS/GRAFTS ANGIOGRAPHY N/A 07/13/2018   Procedure: LEFT HEART CATH AND CORS/GRAFTS ANGIOGRAPHY;  Surgeon: Martinique, Peter M, MD;  Location: [place] [facility];  Service: Cardiovascular;  Laterality: N/A;  . LEFT HEART CATHETERIZATION WITH CORONARY ANGIOGRAM N/A 12/15/2011   Procedure: LEFT HEART CATHETERIZATION WITH CORONARY ANGIOGRAM;  Surgeon: Burnell Blanks, MD;  Location: [facility] CATH LAB;  Service: Cardiovascular;  Laterality: N/A;  . LEFT HEART CATHETERIZATION WITH CORONARY ANGIOGRAM N/A 01/10/2012   Procedure: LEFT HEART CATHETERIZATION WITH CORONARY ANGIOGRAM;  Surgeon: Peter M Martinique, MD;  Location: [facility] CATH LAB;  Service: Cardiovascular;  Laterality: N/A;  . LEFT HEART CATHETERIZATION WITH CORONARY ANGIOGRAM N/A 06/08/2012   Procedure: LEFT HEART CATHETERIZATION WITH CORONARY ANGIOGRAM;  Surgeon: Burnell Blanks, MD;  Location: [facility] CATH LAB;  Service: Cardiovascular;  Laterality: N/A;  . LEFT HEART CATHETERIZATION WITH CORONARY/GRAFT  ANGIOGRAM N/A 12/10/2013   Procedure: LEFT HEART CATHETERIZATION WITH Beatrix Fetters;  Surgeon: [name], MD;  Location: [facility] CATH LAB;  Service: Cardiovascular;  Laterality: N/A;  . OVARY SURGERY     ovarian cancer  . POLYPECTOMY  03/09/2019   Procedure: POLYPECTOMY;  Surgeon: Rogene Houston, MD;  [facility];  Service: Endoscopy;;  cecal   . POLYPECTOMY N/A 09/26/2019   Procedure: DUODENAL POLYPECTOMY;  Surgeon: Rogene Houston, MD;  [facility];  Service: Endoscopy;  Laterality: N/A;  . REVISION OF ARTERIOVENOUS GORETEX GRAFT N/A 02/24/2017   Procedure: REVISION OF ARTERIOVENOUS GORETEX GRAFT (RESECTION);  Surgeon: Katha Cabal, MD;  Location: [facility] ORS;  Service: Vascular;  Laterality: N/A;  . REVISON OF ARTERIOVENOUS FISTULA Left 06/19/2020   Procedure: REVISION OF LEFT UPPER ARM AV GRAFT WITH INTERPOSITION JUMP GRAFT USING 6MM GORE LIMB;  Surgeon: Marty Heck, MD;  Location: [place];  Service: Vascular;  Laterality: Left;  . SHUNTOGRAM N/A 10/15/2013   Procedure: Fistulogram;  Surgeon: Serafina Mitchell, MD;  Location: [facility] CATH LAB;  Service: Cardiovascular;  Laterality: N/A;  . THROMBECTOMY / ARTERIOVENOUS GRAFT REVISION  2011   left upper arm  . TUBAL LIGATION  1980's  . UPPER EXTREMITY ANGIOGRAPHY Bilateral 12/06/2016   Procedure: Upper Extremity Angiography;  Surgeon: Katha Cabal, MD;  Location: [place] [facility];  Service: Cardiovascular;  Laterality: Bilateral;  . UPPER EXTREMITY INTERVENTION Left 06/06/2017   Procedure: UPPER EXTREMITY INTERVENTION;  Surgeon: Katha Cabal, MD;  Location: [place] [facility];  Service: Cardiovascular;  Laterality: Left;    Allergies:  Allergies  Allergen Reactions  . Aspirin Other (See Comments)    High Doses Mess up her stomach; "makes my bowels have blood in them". Takes 81 mg EC Aspirin   .  Penicillins Other (See Comments)    SYNCOPE? , "makes me real weak when I take it;  like I'll pass out"  Has patient had a PCN reaction causing immediate rash, facial/tongue/throat swelling, SOB or lightheadedness with hypotension: Yes Has patient had a PCN reaction causing severe rash involving mucus membranes or skin necrosis: no Has patient had a PCN reaction that required hospitalization no Has patient had a PCN reaction occurring within the last 10 years: no If all of the above  . Amlodipine Swelling  . Bactrim [Sulfamethoxazole-Trimethoprim] Rash  . Contrast Media [Iodinated Diagnostic Agents] Itching  . Iron Itching and Other (See Comments)    "they gave me iron in dialysis; had to give me Benadryl cause I had to have the iron" (05/02/2012)  . Nitrofurantoin Hives  . Tylenol [Acetaminophen] Itching and Other (See Comments)    Makes her feet on fire per pt  . Gabapentin Other (See Comments)    Unknown reaction  . Hydralazine Itching  . Levofloxacin   . No Known Allergies   . Ranexa [Ranolazine] Other (See Comments)    Myoclonus-hospitalized   . Dexilant [Dexlansoprazole] Other (See Comments)    Upset stomach  . Levaquin [Levofloxacin In D5w] Rash  . Morphine And Related Itching and Other (See Comments)    Itching in feet  . Plavix [Clopidogrel Bisulfate] Rash  . Protonix [Pantoprazole Sodium] Rash  . Venofer [Ferric Oxide] Itching and Other (See Comments)    Patient reports using Benadryl prior to doses as [place]    Medications:   Prior to Admission medications   Medication Sig Start Date End Date Taking? Authorizing Provider  amiodarone (PACERONE) 200 MG tablet Take 1 tablet (200 mg total) by mouth daily. Starting July 2 12/17/19  Yes Strader, Fransisco Hertz, PA-C  aspirin EC 81 MG tablet Take 81 mg by mouth daily.  09/27/19  Yes Rehman, Mechele Dawley, MD  cyclobenzaprine (FLEXERIL) 5 MG tablet Take 5 mg by mouth 3 (three) times daily as needed for muscle spasms. 11/30/20  Yes [provider]  fluticasone (FLONASE) 50 MCG/ACT nasal spray Place 1  spray into both nostrils at bedtime as needed for allergies.    Yes [provider]  hydrOXYzine (VISTARIL) 100 MG capsule Take 100 mg by mouth 2 (two) times daily as needed for anxiety or itching. 11/23/20  Yes [provider]  isosorbide mononitrate (IMDUR) 60 MG 24 hr tablet Take 1 tablet (60 mg total) by mouth in the morning and at bedtime. 10/20/20 01/18/21 Yes Strader, Fransisco Hertz, PA-C  lidocaine-prilocaine (EMLA) cream Apply 1 application topically every Monday, Wednesday, and Friday. Prior to dialysis   Yes [provider]  loperamide (IMODIUM A-D) 2 MG tablet Take 1 tablet (2 mg total) by mouth daily before breakfast. Patient taking differently: Take 4 mg by mouth daily as needed for diarrhea or loose stools. 12/01/20  Yes Rehman, Mechele Dawley, MD  loratadine (CLARITIN) 10 MG tablet Take 1 tablet (10 mg total) by mouth daily as needed for allergies. 07/14/18  Yes Hosie Poisson, MD  multivitamin (RENA-VIT) TABS tablet Take 1 tablet by mouth daily.   Yes [provider]  nitroGLYCERIN (NITROSTAT) 0.4 MG SL tablet Place 1 tablet (0.4 mg total) under the tongue every 5 (five) minutes x 3 doses as needed for chest pain (if no relief after 2nd dose, proceed to the ED for an evaluation or call 911). 09/08/20  Yes Verta Ellen., NP  omeprazole (PRILOSEC) 20 MG  capsule Take 1 capsule (20 mg total) by mouth daily. 12/01/20  Yes Rehman, Mechele Dawley, MD  sevelamer carbonate (RENVELA) 800 MG tablet Take 2 tablets (1,600 mg total) by mouth 3 (three) times daily with meals. 06/20/20  Yes Meccariello, Bernita Raisin, DO  simvastatin (ZOCOR) 20 MG tablet Take 1 tablet (20 mg total) by mouth at bedtime. 07/07/20  Yes Verta Ellen., NP  triamcinolone (KENALOG) 0.025 % ointment Apply 1 application topically 2 (two) times daily as needed (itching). 11/20/20  Yes [provider]  triamcinolone cream (KENALOG) 0.1 % Apply 1 application topically 2 (two) times daily as needed  (itching). 11/19/20  Yes [provider]    Discontinued Meds:   Medications Discontinued During This Encounter  Medication Reason  . isosorbide mononitrate (IMDUR) 30 MG 24 hr tablet Patient Preference  . labetalol (NORMODYNE) injection 5 mg   . 0.9 %  sodium chloride infusion     Social History:  reports that she has never smoked. She has never used smokeless tobacco. She reports that she does not drink alcohol and does not use drugs.  Family History:   Family History  Problem Relation Age of Onset  . Heart disease Mother        Heart Disease before age 34  . Hyperlipidemia Mother   . Hypertension Mother   . Diabetes Mother   . Heart attack Mother   . Heart disease Father        Heart Disease before age 46  . Hyperlipidemia Father   . Hypertension Father   . Diabetes Father   . Diabetes Sister   . Hypertension Sister   . Diabetes Brother   . Hyperlipidemia Brother   . Heart attack Brother   . Hypertension Sister   . Heart attack Brother   . Colon cancer Child 47  . Other Other        noncontributory for early CAD  . Esophageal cancer Neg Hx   . Liver disease Neg Hx   . Kidney disease Neg Hx   . Colon polyps Neg Hx     Blood pressure 124/87, pulse 69, temperature 98.3 F (36.8 C), temperature source Oral, resp. rate 18, weight 60.5 kg, SpO2 99 %. General appearance: alert, cooperative and appears stated age Head: Normocephalic, without obvious abnormality, atraumatic Eyes: negative Neck: no adenopathy, no carotid bruit, no JVD, supple, symmetrical, trachea midline and thyroid not enlarged, symmetric, no tenderness/mass/nodules Back: symmetric, no curvature. ROM normal. No CVA tenderness. Resp: clear to auscultation bilaterally Cardio: regular rate and rhythm GI: soft, non-tender; bowel sounds normal; no masses,  no organomegaly Extremities: extremities normal, atraumatic, no cyanosis or edema Pulses: 2+ and symmetric Access: left BCF  +thrill        Dwana Melena, MD 12/04/2020, 7:31 AM

## 2020-12-04 NOTE — Progress Notes (Signed)
PROGRESS NOTE    Shawna [name]  ZLD:[id number] DOB: [birth year] DOA: 12/02/2020 PCP: [facility]   Brief Narrative: Shawna [name] is a 80 y.o. female with a history of paroxysmal atrial fibrillation, end-stage renal disease on HD, chronic diastolic heart failure, hypertension, hyperlipidemia.  Patient presented secondary to unsteadiness on feet and weakness with concern for myositis on admission.  Neurology was consulted on admission.  Initial work-up appears to be negative for myositis.    Assessment & Plan:   Principal Problem:   Myositis Active Problems:   ESRD on hemodialysis (HCC)   HLD (hyperlipidemia)   Chronic diastolic CHF (congestive heart failure) (HCC)   Essential hypertension   Paroxysmal atrial fibrillation (HCC)   Leg weakness Initial concern for myositis. Initial workup appears unremarkable for this diagnosis with normal CK, CRP, mildly elevated ESR and negative femur MRI. MRI brain negative for acute stroke. PT recommending SNF -Neurology recommendations follow-up ANA, outpatient EMG  Confusion Unknown etiology. Per husband, this has been an issue for the last few days since her fall. Possibly related to a concussion. Patient's TSH is mildly elevated in setting of amiodarone use as an outpatient. Patient does not make urine. No acute abnormalities on head imaging but there is evidence of chronic vascular disease. Stable.  Paroxysmal atrial fibrillation Currently in sinus rhythm. Discussed with cardiology about amiodarone who recommended stopping amiodarone in setting of elevated TSH. Patient is not on anticoagulation in setting of recurrent acute GI bleeding.  ESRD on HD Last HD session on 5/25. Nephrology consulted for inpatient HD while admitted.  Primary hypertension -Continue Imdur  Chronic diastolic heart failure Stable.   DVT prophylaxis: SCDs  Code Status:   Code Status: Full Code Family Communication: Husband on telephone (10  minutes) Disposition Plan: Discharge to SNF in 2-3 days pending improvement of mental status in addition to neurology recommendations for weakness   Consultants:   Neurology  Procedures:   None  Antimicrobials:  None    Subjective: No issues.   Objective: Vitals:   12/03/20 1944 12/03/20 1945 12/03/20 2339 12/04/20 0426  BP: (!) 190/51 (!) 188/49 (!) 176/88 124/87  Pulse: 74 75 67 69  Resp: _0 Temp: 98.5 F (36.9 C)  98.4 F (36.9 C) 98.3 F (36.8 C)  TempSrc: Oral  Oral Oral  SpO2: 91% 91% 97% 99%  Weight:    60.5 kg    Intake/Output Summary (Last 24 hours) at 12/04/2020 1111 Last data filed at 12/03/2020 1714 Gross per 24 hour  Intake 78.43 ml  Output --  Net 78.43 ml   Filed Weights   12/04/20 0426  Weight: 60.5 kg    Examination:  General exam: Appears calm and comfortable Respiratory system: Clear to auscultation. Respiratory effort normal. Cardiovascular system: S1 & S2 heard, RRR. No murmurs, rubs, gallops or clicks. Gastrointestinal system: Abdomen is nondistended, soft and nontender. No organomegaly or masses felt. Normal bowel sounds heard. Central nervous system: Alert and oriented to person. Not oriented to place or time. No focal neurological deficits. Musculoskeletal: No edema. No calf tenderness Skin: No cyanosis. No rashes Psychiatry: Judgement and insight appear impaired. Memory impaired. Mood & affect appropriate.     Data Reviewed: I have personally reviewed following labs and imaging studies  CBC Lab Results  Component Value Date   WBC 7.9 12/03/2020   RBC 2.21 (L) 12/03/2020   HGB 8.1 (L) 12/03/2020   HCT 26.5 (L) 12/03/2020   MCV 119.9 (H)  12/03/2020   MCH 36.7 (H) 12/03/2020   PLT 281 12/03/2020   MCHC 30.6 12/03/2020   RDW 17.5 (H) 12/03/2020   LYMPHSABS 1.0 12/02/2020   MONOABS 0.5 12/02/2020   EOSABS 0.4 12/02/2020   BASOSABS 0.1 77/82/4235     Last metabolic panel Lab Results  Component Value Date    NA 136 12/03/2020   K 3.8 12/03/2020   CL 97 (L) 12/03/2020   CO2 29 12/03/2020   BUN 12 12/03/2020   CREATININE 4.81 (H) 12/03/2020   GLUCOSE 140 (H) 12/03/2020   GFRNONAA 9 (L) 12/03/2020   GFRAA 9 (L) 02/12/2020   CALCIUM 10.0 12/03/2020   PHOS 7.5 (H) 12/04/2020   PROT 7.0 11/09/2020   ALBUMIN 3.1 (L) 11/10/2020   LABGLOB 2.8 12/26/2018   AGRATIO 1.4 12/26/2018   BILITOT 0.6 11/09/2020   ALKPHOS 80 11/09/2020   AST 8 (L) 11/09/2020   ALT 13 11/09/2020   ANIONGAP 10 12/03/2020    CBG (last 3)  No results for input(s): GLUCAP in the last 72 hours.   GFR: Estimated Creatinine Clearance: 7.8 mL/min (A) (by C-G formula based on SCr of 4.81 mg/dL (H)).  Coagulation Profile: No results for input(s): INR, PROTIME in the last 168 hours.  Recent Results (from the past 240 hour(s))  SARS CORONAVIRUS 2 (TAT 6-24 HRS) Nasopharyngeal Nasopharyngeal Swab     Status: None   Collection Time: 12/02/20 11:18 PM   Specimen: Nasopharyngeal Swab  Result Value Ref Range Status   SARS Coronavirus 2 NEGATIVE NEGATIVE Final    Comment: (NOTE) SARS-CoV-2 target nucleic acids are NOT DETECTED.  The SARS-CoV-2 RNA is generally detectable in upper and lower respiratory specimens during the acute phase of infection. Negative results do not preclude SARS-CoV-2 infection, do not rule out co-infections with other pathogens, and should not be used as the sole basis for treatment or other patient management decisions. Negative results must be combined with clinical observations, patient history, and epidemiological information. The expected result is Negative.  Fact Sheet for Patients: SugarRoll.be  Fact Sheet for Healthcare Providers: https://www.woods-mathews.com/  This test is not yet approved or cleared by the [place] FDA and  has been authorized for detection and/or diagnosis of SARS-CoV-2 by FDA under an Emergency Use Authorization (EUA).  This EUA will remain  in effect (meaning this test can be used) for the duration of the COVID-19 declaration under Se ction 564(b)(1) of the Act, 21 U.S.C. section 360bbb-3(b)(1), unless the authorization is terminated or revoked sooner.  Performed at [facility], [place] [address] [place],  [address]         Radiology Studies: CT Head Wo Contrast  Result Date: 12/02/2020 CLINICAL DATA:  80 year old female with head trauma. EXAM: CT HEAD WITHOUT CONTRAST TECHNIQUE: Contiguous axial images were obtained from the base of the skull through the vertex without intravenous contrast. COMPARISON:  Head CT dated 11/09/2020. FINDINGS: Brain: Moderate age-related atrophy and chronic microvascular ischemic changes. There is no acute intracranial hemorrhage. No mass effect or midline shift. No extra-axial fluid collection. Vascular: No hyperdense vessel or unexpected calcification. Skull: Normal. Negative for fracture or focal lesion. Sinuses/Orbits: Small retention cyst or polyp in the left sphenoid sinus. The remainder of the visualized paranasal sinuses and mastoid air cells are clear. No air-fluid level. Partially visualized cystic area in the right maxilla at the right maxillary central incisor root. This may represent an area of periapical lucency or a dentigerous cyst. Correlation with dental exam recommended.  Other: None IMPRESSION: 1. No acute intracranial pathology. 2. Moderate age-related atrophy and chronic microvascular ischemic changes. Electronically Signed   By: Anner Crete M.D.   On: 12/02/2020 19:25   MR Brain Wo Contrast (neuro protocol)  Result Date: 12/02/2020 CLINICAL DATA:  Initial evaluation for acute dizziness. EXAM: MRI HEAD WITHOUT CONTRAST TECHNIQUE: Multiplanar, multiecho pulse sequences of the brain and surrounding structures were obtained without intravenous contrast. COMPARISON:  Prior CT from earlier the same day as well as recent MRI from 11/10/2020.  FINDINGS: Brain: Examination degraded by motion artifact. Generalized age-related cerebral atrophy. Fairly severe chronic microvascular ischemic disease noted involving the periventricular deep white matter both cerebral hemispheres, with more mild patchy involvement of the pons. Remote lacunar infarct at the left basal ganglia. Few additional small remote left cerebellar infarcts noted. No convincing foci of restricted diffusion to suggest acute or subacute ischemia. Gray-white matter differentiation maintained. No encephalomalacia to suggest chronic cortical infarction. No acute intracranial hemorrhage. Few scattered chronic micro hemorrhages noted about the cerebellum and right cerebral hemisphere, likely small vessel related. No mass lesion, midline shift or mass effect. No hydrocephalus or extra-axial fluid collection. Pituitary gland suprasellar region normal. Midline structures intact. Vascular: Major intracranial vascular flow voids are grossly maintained at the skull base. Skull and upper cervical spine: Craniocervical junction within normal limits. Bone marrow signal intensity normal. No focal marrow replacing lesion. No scalp soft tissue abnormality. Sinuses/Orbits: Globes and orbital soft tissues demonstrate no acute finding. Mild mucosal thickening noted within the left sphenoid sinus. Paranasal sinuses are otherwise clear. No mastoid effusion. Other: None. IMPRESSION: 1. No acute intracranial abnormality. 2. Age-related cerebral atrophy with advanced chronic microvascular ischemic disease, with a few scattered remote lacunar infarcts involving the left basal ganglia and left cerebellum. Electronically Signed   By: Jeannine Boga M.D.   On: 12/02/2020 22:43   MR FEMUR LEFT WO CONTRAST  Result Date: 12/03/2020 CLINICAL DATA:  Bilateral upper leg weakness and achiness; onset difficulty walking for approximately 1 month. EXAM: MR OF THE LEFT FEMUR WITHOUT CONTRAST TECHNIQUE: Multiplanar,  multisequence MR imaging of the left femur was performed. No intravenous contrast was administered. COMPARISON:  None. FINDINGS: Bones/Joint/Cartilage No fracture, stress change or worrisome lesion is identified. No hip or SI joint effusion. Small bilateral knee joint effusions are noted. Joint spaces about the hips and knees appear preserved. No subchondral cyst formation or edema about any joint. No avascular necrosis of the femoral heads. Ligaments Negative. Muscles and Tendons Intact without tear or strain. No evidence of inflammatory change, atrophy or intramuscular mass. No fluid collection. Soft tissues Mild subcutaneous edema about both upper legs is likely due to dependent change. Imaged intrapelvic contents demonstrate sigmoid diverticulosis. The patient is status post hysterectomy. IMPRESSION: No acute abnormality or finding to explain the patient's symptoms. Mild bilateral subcutaneous edema is likely due to dependent change. Diverticulosis. Electronically Signed   By: Inge Rise M.D.   On: 12/03/2020 08:31   DG CHEST PORT 1 VIEW  Result Date: 12/03/2020 CLINICAL DATA:  Dyspnea EXAM: PORTABLE CHEST 1 VIEW COMPARISON:  11/09/2020 FINDINGS: The lungs are symmetrically well expanded. Stable left basilar scarring. Tiny left pleural effusion or pleural thickening is unchanged. Coronary artery bypass grafting has been performed. Cardiac size is mildly enlarged, unchanged. Previously noted interstitial pulmonary edema has significantly improved. There is mild persistent pulmonary vascular redistribution of the lung apices in keeping with mild volume overload or early cardiogenic failure. No acute bone abnormality. IMPRESSION: Interval resolution of  pulmonary edema with persistent mild central pulmonary vascular congestion. Stable cardiomegaly. Stable left basilar scarring. Small left pleural effusion or pleural scarring. Electronically Signed   By: Fidela Salisbury MD   On: 12/03/2020 23:36         Scheduled Meds: . aspirin EC  81 mg Oral Daily  . Chlorhexidine Gluconate Cloth  6 each Topical Q0600  . isosorbide mononitrate  60 mg Oral BID  . sevelamer carbonate  1,600 mg Oral TID WC   Continuous Infusions:    LOS: 2 days     Cordelia Poche, MD [facility] 12/04/2020, 11:11 AM  If 7PM-7AM, please contact night-coverage [email or web address]

## 2020-12-04 NOTE — TOC Initial Note (Addendum)
Transition of Care [facility]) - Initial/Assessment Note    Patient Details  Name: Shawna [name] MRN: [id number] Date of Birth: [birth year]  Transition of Care [facility]) CM/SW Contact:    Benard Halsted, LCSW Phone Number: 12/04/2020, 10:37 AM  Clinical Narrative:                 10:37am-CSW received consult for possible SNF placement at time of discharge. CSW spoke with patient's spouse. He reported that patient's spouse is currently unable to care for patient at their home given patient's current physical needs and fall risk. Patient's spouse expressed understanding of PT recommendation and is agreeable to SNF placement at time of discharge. He reports preference for [facility] or [facility] as patient has been to both of those before and they will transport to Dialysis in [place] [facility] MWF ~10:30am). CSW discussed insurance authorization process and provided Medicare SNF ratings list. Patient has received the COVID vaccines and booster. No further questions reported at this time; will send SNF referral.   4pm-[facility] does not have beds available. [facility] is able to accept patient and will start insurance authorization.   Expected Discharge Plan: Skilled Nursing Facility Barriers to Discharge: [place] Work up,SNF Pending bed offer   Patient Goals and CMS Choice Patient states their goals for this hospitalization and ongoing recovery are:: Rehab CMS Medicare.gov Compare Post Acute Care list provided to:: Patient Represenative (must comment) Choice offered to / list presented to : Spouse  Expected Discharge Plan and Services Expected Discharge Plan: [place] In-house Referral: Clinical Social Work   Post Acute Care Choice: [place] Living arrangements for the past 2 months: [place]                                      Prior Living Arrangements/Services Living arrangements for  the past 2 months: Single Family Home Lives with:: Spouse Patient language and need for interpreter reviewed:: Yes Do you feel safe going back to the place where you live?: Yes      Need for Family Participation in Patient Care: Yes (Comment) Care giver support system in place?: Yes (comment) Current home services: DME Criminal Activity/Legal Involvement Pertinent to Current Situation/Hospitalization: No - Comment as needed  Activities of Daily Living      Permission Sought/Granted Permission sought to share information with : Facility Contact Representative,Family Supports Permission granted to share information with : No  Share Information with NAME: [name]  Permission granted to share info w AGENCY: SNFs  Permission granted to share info w Relationship: Spouse  Permission granted to share info w Contact Information: [phone]  Emotional Assessment Appearance:: Appears stated age Attitude/Demeanor/Rapport: Unable to Assess Affect (typically observed): Unable to Assess Orientation: : Oriented to Self Alcohol / Substance Use: Not Applicable Psych Involvement: No (comment)  Admission diagnosis:  Ataxia [R27.0] Patient Active Problem List   Diagnosis Date Noted  . Myositis 12/03/2020  . Ataxia 12/02/2020  . Diabetes mellitus type 2 in nonobese ([place]) 11/10/2020  . Acute pulmonary edema ([place]) 11/10/2020  . Generalized weakness 11/09/2020  . Dialysis AV fistula malfunction, initial encounter ([place])   . Jugular vein occlusion, right ([place])   . Failure of surgically constructed arteriovenous fistula ([place]) 10/03/2020  . Myoclonus 08/31/2020  . Clotted renal dialysis AV graft, initial encounter ([place])   . Hypertensive heart and chronic  kidney disease with heart failure and stage 1 through stage 4 chronic kidney disease, or chronic kidney disease ([place])   . Hemodialysis-associated hypotension   . Acute hypoxemic respiratory failure ([place]) 06/14/2020  . Hypertension 06/12/2020  .  Irritable bowel syndrome 02/25/2020  . Adenomatous duodenal polyp 09/10/2019  . History of GI bleed 09/10/2019  . Angina pectoris ([place]) 06/05/2019  . Chest pain 06/03/2019  . Small intestinal bacterial overgrowth 05/14/2019  . Iron deficiency anemia 04/02/2019  . GI bleed 03/08/2019  . Gastrointestinal hemorrhage with melena 03/06/2019  . Acute respiratory failure with hypoxia ([place]) 12/25/2018  . Elevated troponin 12/14/2018  . Chest pain at rest 07/13/2018  . Hand steal syndrome ([place]) 08/01/2017  . Anemia 07/14/2017  . Coronary artery disease 06/05/2017  . Mesenteric ischemia ([place])   . Diverticulitis   . Enteritis   . Complication of vascular access for dialysis 03/19/2017  . Preoperative clearance 01/25/2017  . H/O non-ST elevation myocardial infarction (NSTEMI) 10/24/2016  . Fluid overload 10/10/2016  . Complication from renal dialysis device 10/10/2016  . Non-ST elevation MI (NSTEMI) ([place])   . Encounter for fitting and adjustment of vascular catheter   . Heme positive stool   . Demand ischemia ([place]) 07/27/2016  . Hypertensive emergency 07/08/2016  . Acute on chronic respiratory failure with hypoxia ([place])   . Cardiac arrest ([place])   . Palliative care encounter   . Goals of care, counseling/discussion   . Hypertensive crisis without congestive heart failure 05/09/2016  . Flash pulmonary edema ([place]) 04/06/2016  . Acute respiratory failure ([place]) 04/06/2016  . Hypertensive crisis 01/27/2016  . History of colon cancer 01/27/2016  . History of ovarian cancer 01/27/2016  . Hypertensive urgency 01/27/2016  . Paroxysmal atrial fibrillation ([place]) 10/14/2015  . Coronary angioplasty status 10/14/2015  . Malignant neoplasm of right ovary ([place]) 10/14/2015  . Narrow complex tachycardia ([place]) 09/08/2015  . SVT (supraventricular tachycardia) ([place]) 09/08/2015  . Influenza A 08/30/2015  . Acute on chronic diastolic CHF (congestive heart failure) ([place]) 05/04/2015  . Unstable angina ([place])  05/03/2015  . DOE (dyspnea on exertion)   . Essential hypertension   . Pain in joint, lower leg 08/14/2014  . Dacryocystitis 05/29/2013  . Chronic diastolic CHF (congestive heart failure) ([place]) 03/22/2013  . GI bleeding 03/21/2013  . Acute blood loss anemia 03/21/2013  . Occlusion and stenosis of carotid artery without mention of cerebral infarction 01/24/2013  . Hx of CABG 07/05/2012  . Carotid artery disease ([place]) 07/05/2012  . Mitral regurgitation 06/12/2012  . Pneumonia 06/09/2012  . Non-STEMI (non-ST elevated myocardial infarction) ([place]) 06/08/2012  . Ischemic chest pain ([place]) 03/01/2012  . 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 GERD (gastroesophageal reflux disease) 01/09/2012  . HLD (hyperlipidemia) 01/05/2012  . Atherosclerotic heart disease of native coronary artery without angina pectoris 12/16/2011  . Essential hypertension, benign 12/16/2011  . ESRD on hemodialysis ([place]) 12/16/2011  . Anxiety disorder 05/04/2011  . Anemia in chronic kidney disease 04/29/2011  . ESRD (end stage renal disease) on dialysis ([place]) 04/29/2011  . Gout 04/29/2011  . Hypertensive chronic kidney disease with stage 5 chronic kidney disease or end stage renal disease ([place]) 04/29/2011   PCP:  Practice, [place]:   [facility] [address] [place] [address] Phone: [phone] Fax: [phone]  [facility] (ESRD Bundle Only) - [place], [address] [place] [address]  Phone: [phone] Fax: [phone]     Social Determinants of Health (SDOH) Interventions    Readmission Risk Interventions Readmission Risk Prevention Plan 11/11/2020 10/04/2020 09/02/2020  Transportation Screening Complete Complete Complete  PCP or Specialist Appt within 3-5 Days - - -  [place] or [place] for [place] - - -  Medication Review Press photographer) Complete Complete Complete  PCP or Specialist appointment within 3-5 days of discharge Complete Complete -  [place] or [place] Complete Complete Complete  SW Recovery Care/Counseling Consult Complete Complete Complete  Palliative Care Screening Not Applicable Not Applicable Not Applicable  [place] Not Applicable Complete Patient Refused  Some recent data might be hidden

## 2020-12-05 ENCOUNTER — Inpatient Hospital Stay (HOSPITAL_COMMUNITY): Payer: Medicare HMO

## 2020-12-05 DIAGNOSIS — R270 Ataxia, unspecified: Secondary | ICD-10-CM

## 2020-12-05 LAB — CBC
HCT: 22.9 % — ABNORMAL LOW (ref 36.0–46.0)
Hemoglobin: 7.2 g/dL — ABNORMAL LOW (ref 12.0–15.0)
MCH: 36.4 pg — ABNORMAL HIGH (ref 26.0–34.0)
MCHC: 31.4 g/dL (ref 30.0–36.0)
MCV: 115.7 fL — ABNORMAL HIGH (ref 80.0–100.0)
Platelets: 254 10*3/uL (ref 150–400)
RBC: 1.98 MIL/uL — ABNORMAL LOW (ref 3.87–5.11)
RDW: 17.2 % — ABNORMAL HIGH (ref 11.5–15.5)
WBC: 6.4 10*3/uL (ref 4.0–10.5)
nRBC: 0 % (ref 0.0–0.2)

## 2020-12-05 LAB — HEPATITIS B CORE ANTIBODY, IGM: Hep B C IgM: NONREACTIVE

## 2020-12-05 LAB — CALCIUM, IONIZED: Calcium, Ionized, Serum: 5.8 mg/dL — ABNORMAL HIGH (ref 4.5–5.6)

## 2020-12-05 IMAGING — DX DG ANKLE 2V *L*
1 series · 2 of 2 positions shown · non-contrast
Comparison: None.

CLINICAL DATA: 79-year-old female with left foot pain.

EXAM:
LEFT ANKLE - 2 VIEW

[Series 1: ankle · 0.14mm/px · 2 of 2 slices shown]
[im 1/2]
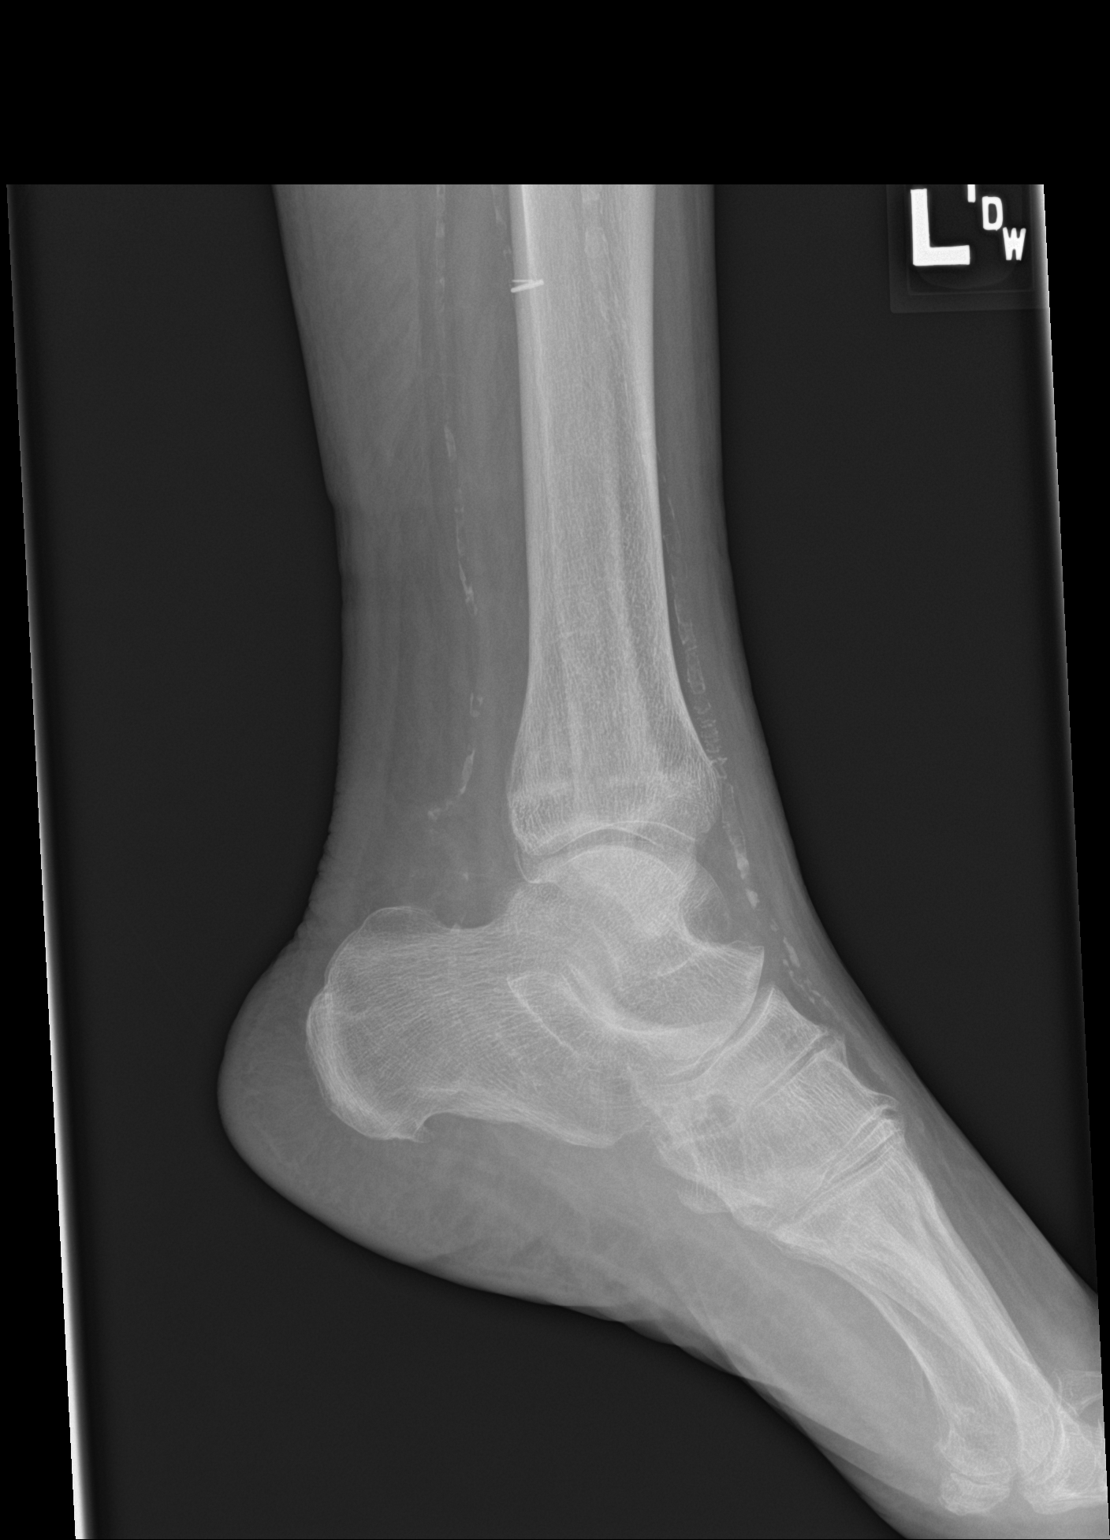
[im 2/2]
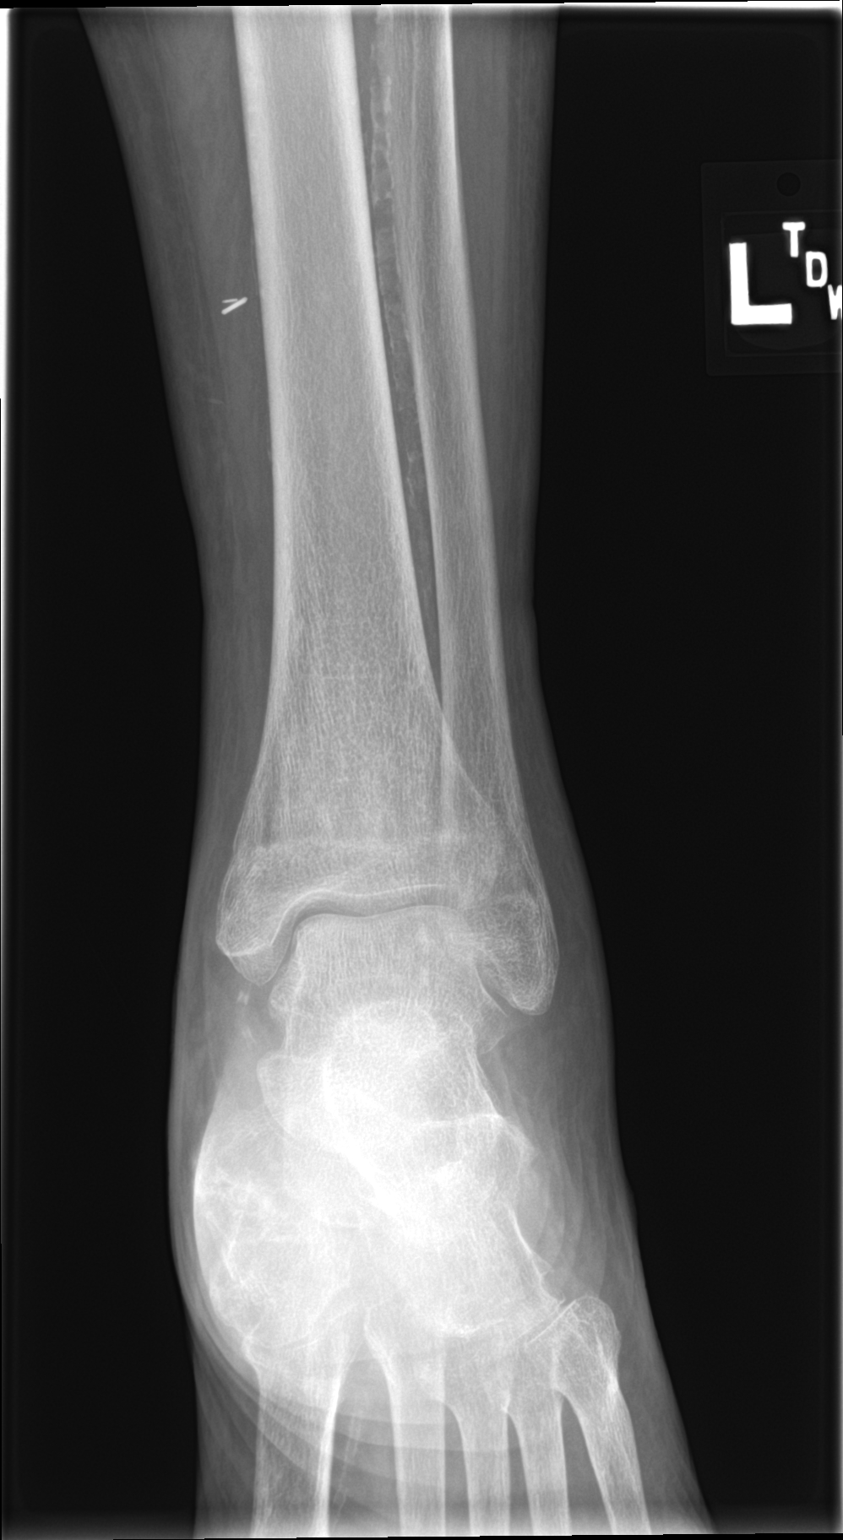

[2 of 2 positions shown; findings below may reference images not displayed]

FINDINGS: There is no acute fracture or dislocation. The bones are osteopenic.
The ankle mortise is intact. The soft tissues are unremarkable.
Vascular calcifications noted.
IMPRESSION: No acute fracture or dislocation.

## 2020-12-05 MED ORDER — HYDRALAZINE HCL 25 MG PO TABS
25.0000 mg | ORAL_TABLET | Freq: Four times a day (QID) | ORAL | Status: DC | PRN
  Administered 2020-12-05: 25 mg via ORAL
  Filled 2020-12-05: qty 1

## 2020-12-05 NOTE — Progress Notes (Signed)
No significant changes.   At this point, we will continue holding statin.  Amiodarone can cause myopathy as well, so if she continues to progress, this may be a consideration.  Neck steps would include monitoring off of statin therapy and outpatient EMG.  Neurology will be available on an as-needed basis moving forward, please call with further questions or concerns.  Roland Rack, MD [facility] [phone]  If 7pm- 7am, please page neurology on call as listed in [place].

## 2020-12-05 NOTE — Progress Notes (Signed)
PROGRESS NOTE    REI CONTEE  ZTI:[id number] DOB: [birth year] DOA: 12/02/2020 PCP: Practice, [facility]   Brief Narrative: Shawna [name] is a 80 y.o. female with a history of paroxysmal atrial fibrillation, end-stage renal disease on HD, chronic diastolic heart failure, hypertension, hyperlipidemia.  Patient presented secondary to unsteadiness on feet and weakness with concern for myositis on admission.  Neurology was consulted on admission.  Initial work-up appears to be negative for myositis.    Assessment & Plan:   Principal Problem:   Myositis Active Problems:   ESRD on hemodialysis (HCC)   HLD (hyperlipidemia)   Chronic diastolic CHF (congestive heart failure) (HCC)   Essential hypertension   Paroxysmal atrial fibrillation (HCC)   Leg weakness Initial concern for myositis. Initial workup appears unremarkable for this diagnosis with normal CK, CRP, mildly elevated ESR and negative femur MRI. MRI brain negative for acute stroke. PT recommending SNF -Neurology recommendations follow-up ANA, outpatient EMG, neuropsych evaluation  Confusion Unknown etiology. Per husband, this has been an issue for the last few days since her fall. Possibly related to a concussion. Patient's TSH is mildly elevated in setting of amiodarone use as an outpatient. Patient does not make urine. No acute abnormalities on head imaging but there is evidence of chronic vascular disease. Stable.  Paroxysmal atrial fibrillation Currently in sinus rhythm. Discussed with cardiology about amiodarone who recommended stopping amiodarone in setting of elevated TSH. Patient is not on anticoagulation in setting of recurrent acute GI bleeding.  ESRD on HD Last HD session on 5/25. Nephrology consulted for inpatient HD while admitted.  Primary hypertension -Continue Imdur  Chronic diastolic heart failure Stable.  Chronic anemia Likely secondary to ESRD. Baseline hemoglobin of around 8. Hemoglobin down to  7.2 today. -CBC in AM   Left ankle pain -X-ray   DVT prophylaxis: SCDs  Code Status:   Code Status: Full Code Family Communication: None at bedside Disposition Plan: Discharge to SNF in 1-3 days pending improvement of mental status in addition to neurology recommendations for weakness   Consultants:   Neurology  Procedures:   None  Antimicrobials:  None    Subjective: No concerns today  Objective: Vitals:   12/05/20 0030 12/05/20 0413 12/05/20 0540 12/05/20 1142  BP: (!) 191/60  (!) 127/35 (!) 128/28  Pulse: 73  62 68  Resp: 18  15 15   Temp:   98.1 F (36.7 C) 98.6 F (37 C)  TempSrc:   Oral Axillary  SpO2: 96%  94% 99%  Weight:  55.3 kg      Intake/Output Summary (Last 24 hours) at 12/05/2020 1601 Last data filed at 12/05/2020 0900 Gross per 24 hour  Intake 110 ml  Output 1787 ml  Net -1677 ml   Filed Weights   12/04/20 0426 12/04/20 1418 12/05/20 0413  Weight: 60.5 kg 57.5 kg 55.3 kg    Examination:  General exam: Appears calm and comfortable Respiratory system: Clear to auscultation. Respiratory effort normal. Cardiovascular system: S1 & S2 heard, RRR. No murmurs, rubs, gallops or clicks. Gastrointestinal system: Abdomen is nondistended, soft and nontender. No organomegaly or masses felt. Normal bowel sounds heard. Central nervous system: Alert and oriented to person. No focal neurological deficits. Musculoskeletal: No edema. No calf tenderness. Left ankle tenderness Skin: No cyanosis. No rashes Psychiatry: Judgement and insight appear normal. Mood & affect appropriate.      Data Reviewed: I have personally reviewed following labs and imaging studies  CBC Lab Results  Component Value Date  WBC 6.4 12/05/2020   RBC 1.98 (L) 12/05/2020   HGB 7.2 (L) 12/05/2020   HCT 22.9 (L) 12/05/2020   MCV 115.7 (H) 12/05/2020   MCH 36.4 (H) 12/05/2020   PLT 254 12/05/2020   MCHC 31.4 12/05/2020   RDW 17.2 (H) 12/05/2020   LYMPHSABS 1.0 12/02/2020    MONOABS 0.5 12/02/2020   EOSABS 0.4 12/02/2020   BASOSABS 0.1 36/46/8032     Last metabolic panel Lab Results  Component Value Date   NA 136 12/03/2020   K 3.8 12/03/2020   CL 97 (L) 12/03/2020   CO2 29 12/03/2020   BUN 12 12/03/2020   CREATININE 4.81 (H) 12/03/2020   GLUCOSE 140 (H) 12/03/2020   GFRNONAA 9 (L) 12/03/2020   GFRAA 9 (L) 02/12/2020   CALCIUM 10.0 12/03/2020   PHOS 7.5 (H) 12/04/2020   PROT 7.0 11/09/2020   ALBUMIN 3.1 (L) 11/10/2020   LABGLOB 2.8 12/26/2018   AGRATIO 1.4 12/26/2018   BILITOT 0.6 11/09/2020   ALKPHOS 80 11/09/2020   AST 8 (L) 11/09/2020   ALT 13 11/09/2020   ANIONGAP 10 12/03/2020    CBG (last 3)  No results for input(s): GLUCAP in the last 72 hours.   GFR: Estimated Creatinine Clearance: 7 mL/min (A) (by C-G formula based on SCr of 4.81 mg/dL (H)).  Coagulation Profile: No results for input(s): INR, PROTIME in the last 168 hours.  Recent Results (from the past 240 hour(s))  SARS CORONAVIRUS 2 (TAT 6-24 HRS) Nasopharyngeal Nasopharyngeal Swab     Status: None   Collection Time: 12/02/20 11:18 PM   Specimen: Nasopharyngeal Swab  Result Value Ref Range Status   SARS Coronavirus 2 NEGATIVE NEGATIVE Final    Comment: (NOTE) SARS-CoV-2 target nucleic acids are NOT DETECTED.  The SARS-CoV-2 RNA is generally detectable in upper and lower respiratory specimens during the acute phase of infection. Negative results do not preclude SARS-CoV-2 infection, do not rule out co-infections with other pathogens, and should not be used as the sole basis for treatment or other patient management decisions. Negative results must be combined with clinical observations, patient history, and epidemiological information. The expected result is Negative.  Fact Sheet for Patients: SugarRoll.be  Fact Sheet for Healthcare Providers: https://www.woods-mathews.com/  This test is not yet approved or cleared by the  [place] FDA and  has been authorized for detection and/or diagnosis of SARS-CoV-2 by FDA under an Emergency Use Authorization (EUA). This EUA will remain  in effect (meaning this test can be used) for the duration of the COVID-19 declaration under Se ction 564(b)(1) of the Act, 21 U.S.C. section 360bbb-3(b)(1), unless the authorization is terminated or revoked sooner.  Performed at [facility], [place] [address] [place], [place] [address]         Radiology Studies: DG CHEST PORT 1 VIEW  Result Date: 12/03/2020 CLINICAL DATA:  Dyspnea EXAM: PORTABLE CHEST 1 VIEW COMPARISON:  11/09/2020 FINDINGS: The lungs are symmetrically well expanded. Stable left basilar scarring. Tiny left pleural effusion or pleural thickening is unchanged. Coronary artery bypass grafting has been performed. Cardiac size is mildly enlarged, unchanged. Previously noted interstitial pulmonary edema has significantly improved. There is mild persistent pulmonary vascular redistribution of the lung apices in keeping with mild volume overload or early cardiogenic failure. No acute bone abnormality. IMPRESSION: Interval resolution of pulmonary edema with persistent mild central pulmonary vascular congestion. Stable cardiomegaly. Stable left basilar scarring. Small left pleural effusion or pleural scarring. Electronically Signed   By: Cassandria Anger  Christa See MD   On: 12/03/2020 23:36        Scheduled Meds: . aspirin EC  81 mg Oral Daily  . Chlorhexidine Gluconate Cloth  6 each Topical Q0600  . isosorbide mononitrate  60 mg Oral BID  . sevelamer carbonate  1,600 mg Oral TID WC   Continuous Infusions:    LOS: 3 days     Cordelia Poche, MD [facility] 12/05/2020, 4:01 PM  If 7PM-7AM, please contact night-coverage [email or web address]

## 2020-12-05 NOTE — Progress Notes (Signed)
Patient remains confused.  She states she has no memory of going to dialysis yesterday, and has been telling people she is speaking with on the phone that she is in church.

## 2020-12-05 NOTE — Progress Notes (Signed)
[facility] Progress Note     80 y.o. female pAfib HTN HLD h/o TIA, recent COVID infection dCHF CASHD w/p CABG in 2013 with PCI as well in 2018 with DES TO D1 in 10/2016, ESRD MWF [facility] presenting with being unsteady standing noting myalgias in anterior aspect of thighs as well + symmetrical weakness and difficulty ambulating. Admitted  Early 11/2020  w/ also myoclonus and weakness at which time the neurontin was d/c with improvement of myoclonus  after HD and SNF was recommended but the patient refused.   Dialysis Orders: [facility] M[facility]. EDW61kgHD Bath 2K/2.5CaTime 3.5 hrsHeparin none. AccessLUE AVFBFR  Nipro Cellentia 17h Blood flow 300DFR 500   Hectoral 61mcg IV/HD Epogen 8000Units IV/HDgiven 12/02/20   Assessment/ Plan:   1. ESRD - Tolerated HD on 5/27 with net UF 1.8L  - Next HD Monday per outpt regimen; no absolute indications for RRT today  2. Hypertension/volume-h/o [place] with medications and has come in w/ hypertensive urgency. Resume  HD and meds.  3. Anemia- will dose with aranesp as needed 4. Metabolic bone disease- resume priormeds;  phos level 7.5. on renvela 2 tabs TIDM -> will recheck on ~Tuesday if she's still here. May be high because of [place]. 5. Weakness - neurology on board; suspecting statin but she has also been on for many years. Recent admission for weakness which improved with discontinuation of gabapentin + RRT. She feels the weakness is much better. 6. Nutrition- renal diet 7. H/o myoclonus - Do not resume gabapentin as likely source of myoclonus and leg weakness -> improved after gabapentin was stopped last admission + RRT. 8. DM type 2 - per primary 9. Vascular access - have had issues with avf in past and no issues yest.   Subjective:   Feels better; states weakness is improved. Also denies f/c/n/v/dyspnea. Tolerated HD Fri with no issues.   Objective:   BP (!) 127/35 (BP Location: Right Arm)    Pulse 62   Temp 98.1 F (36.7 C) (Oral)   Resp 15   Wt 55.3 kg   SpO2 94%   BMI 23.04 kg/m   Intake/Output Summary (Last 24 hours) at 12/05/2020 0708 Last data filed at 12/04/2020 1800 Gross per 24 hour  Intake 240 ml  Output 1787 ml  Net -1547 ml   Weight change: -3 kg  Physical Exam: Gen: NAD CVS: RRR Resp: CTA Abd: +BS, soft, NT/ND Ext: no edema, LUE AVF +T/B, bandaged  Imaging: DG CHEST PORT 1 VIEW  Result Date: 12/03/2020 CLINICAL DATA:  Dyspnea EXAM: PORTABLE CHEST 1 VIEW COMPARISON:  11/09/2020 FINDINGS: The lungs are symmetrically well expanded. Stable left basilar scarring. Tiny left pleural effusion or pleural thickening is unchanged. Coronary artery bypass grafting has been performed. Cardiac size is mildly enlarged, unchanged. Previously noted interstitial pulmonary edema has significantly improved. There is mild persistent pulmonary vascular redistribution of the lung apices in keeping with mild volume overload or early cardiogenic failure. No acute bone abnormality. IMPRESSION: Interval resolution of pulmonary edema with persistent mild central pulmonary vascular congestion. Stable cardiomegaly. Stable left basilar scarring. Small left pleural effusion or pleural scarring. Electronically Signed   By: Fidela Salisbury MD   On: 12/03/2020 23:36    Labs: BMET Recent Labs  Lab 12/03/20 0201 12/04/20 0837  NA 136  --   K 3.8  --   CL 97*  --   CO2 29  --   GLUCOSE 140*  --   BUN 12  --  CREATININE 4.81*  --   CALCIUM 10.0  --   PHOS  --  7.5*   CBC Recent Labs  Lab 12/02/20 1746 12/03/20 0201 12/05/20 0618  WBC 6.0 7.9 6.4  NEUTROABS 4.0  --   --   HGB 10.6* 8.1* 7.2*  HCT 35.0* 26.5* 22.9*  MCV 120.7* 119.9* 115.7*  PLT 135* 281 254    Medications:    . aspirin EC  81 mg Oral Daily  . Chlorhexidine Gluconate Cloth  6 each Topical Q0600  . isosorbide mononitrate  60 mg Oral BID  . sevelamer carbonate  1,600 mg Oral TID WC      Otelia Santee,  MD 12/05/2020, 7:08 AM

## 2020-12-06 LAB — CBC
HCT: 24.9 % — ABNORMAL LOW (ref 36.0–46.0)
Hemoglobin: 7.8 g/dL — ABNORMAL LOW (ref 12.0–15.0)
MCH: 36.1 pg — ABNORMAL HIGH (ref 26.0–34.0)
MCHC: 31.3 g/dL (ref 30.0–36.0)
MCV: 115.3 fL — ABNORMAL HIGH (ref 80.0–100.0)
Platelets: 328 10*3/uL (ref 150–400)
RBC: 2.16 MIL/uL — ABNORMAL LOW (ref 3.87–5.11)
RDW: 17.2 % — ABNORMAL HIGH (ref 11.5–15.5)
WBC: 9.8 10*3/uL (ref 4.0–10.5)
nRBC: 0 % (ref 0.0–0.2)

## 2020-12-06 LAB — BASIC METABOLIC PANEL
Anion gap: 12 (ref 5–15)
BUN: 24 mg/dL — ABNORMAL HIGH (ref 8–23)
CO2: 26 mmol/L (ref 22–32)
Calcium: 10.8 mg/dL — ABNORMAL HIGH (ref 8.9–10.3)
Chloride: 96 mmol/L — ABNORMAL LOW (ref 98–111)
Creatinine, Ser: 6.46 mg/dL — ABNORMAL HIGH (ref 0.44–1.00)
GFR, Estimated: 6 mL/min — ABNORMAL LOW (ref >60)
Glucose, Bld: 111 mg/dL — ABNORMAL HIGH (ref 70–99)
Potassium: 4.4 mmol/L (ref 3.5–5.1)
Sodium: 134 mmol/L — ABNORMAL LOW (ref 135–145)

## 2020-12-06 LAB — HEPATITIS B E ANTIGEN: Hep B E Ag: NEGATIVE

## 2020-12-06 MED ORDER — HYDRALAZINE HCL 10 MG PO TABS
10.0000 mg | ORAL_TABLET | Freq: Once | ORAL | Status: AC
  Administered 2020-12-06: 10 mg via ORAL
  Filled 2020-12-06: qty 1

## 2020-12-06 NOTE — Progress Notes (Signed)
[facility] Progress Note     80 y.o. female pAfib HTN HLD h/o TIA, recent COVID infection dCHF CASHD w/p CABG in 2013 with PCI as well in 2018 with DES TO D1 in 10/2016, ESRD MWF [facility] presenting with being unsteady standing noting myalgias in anterior aspect of thighs as well + symmetrical weakness and difficulty ambulating. Admitted  Early 11/2020  w/ also myoclonus and weakness at which time the neurontin was d/c with improvement of myoclonus  after HD and SNF was recommended but the patient refused.   Dialysis Orders: [facility]. EDW61kgHD Bath 2K/2.5CaTime 3.5 hrsHeparin none. AccessLUE AVFBFR  Nipro Cellentia 17h Blood flow 300DFR 500   Hectoral 65mcg IV/HD Epogen 8000Units IV/HDgiven 12/02/20   Assessment/ Plan:   1. ESRD - Tolerated HD on 5/27 with net UF 1.8L  - Next HD Monday per outpt regimen; no absolute indications for RRT today  2. Hypertension/volume-h/o [place] with medications and has come in w/ hypertensive urgency. Resume  HD and meds.  3. Anemia- will dose with aranesp as needed 4. Metabolic bone disease- resume priormeds;  phos level 7.5. on renvela 2 tabs TIDM -> will recheck on ~Tuesday if she's still here. May be high because of [place]. 5. Weakness - neurology on board; suspecting statin but she has also been on for many years.  Recent admission for weakness which improved with discontinuation of gabapentin + RRT. She feels the weakness is much better. Neuro rx cont holding statin. 6. Nutrition- renal diet 7. H/o myoclonus - Do not resume gabapentin as likely source of myoclonus and leg weakness -> improved after gabapentin was stopped last admission + RRT. 8. DM type 2 - per primary 9. Vascular access - have had issues with avf in past and no issues yest.   Subjective:   Feels better; states weakness is improved. Also denies f/c/n/v/dyspnea. Tolerated HD Fri with no issues.   Objective:   BP (!)  187/49 (BP Location: Right Arm)   Pulse 78   Temp 98.5 F (36.9 C) (Oral)   Resp 20   Wt 56.1 kg   SpO2 (!) 80%   BMI 23.37 kg/m   Intake/Output Summary (Last 24 hours) at 12/06/2020 0716 Last data filed at 12/05/2020 2200 Gross per 24 hour  Intake 330 ml  Output --  Net 330 ml   Weight change: 2 kg  Physical Exam: Gen: NAD CVS: RRR Resp: CTA Abd: +BS, soft, NT/ND Ext: no edema, LUE AVF +T/B, bandaged  Imaging: DG Ankle 2 Views Left  Result Date: 12/05/2020 CLINICAL DATA:  80 year old female with left foot pain. EXAM: LEFT ANKLE - 2 VIEW COMPARISON:  None. FINDINGS: There is no acute fracture or dislocation. The bones are osteopenic. The ankle mortise is intact. The soft tissues are unremarkable. Vascular calcifications noted. IMPRESSION: No acute fracture or dislocation. Electronically Signed   By: Anner Crete M.D.   On: 12/05/2020 17:20    Labs: BMET Recent Labs  Lab 12/03/20 0201 12/04/20 0837 12/06/20 0103  NA 136  --  134*  K 3.8  --  4.4  CL 97*  --  96*  CO2 29  --  26  GLUCOSE 140*  --  111*  BUN 12  --  24*  CREATININE 4.81*  --  6.46*  CALCIUM 10.0  --  10.8*  PHOS  --  7.5*  --    CBC Recent Labs  Lab 12/02/20 1746 12/03/20 0201 12/05/20 0618 12/06/20 0103  WBC  6.0 7.9 6.4 9.8  NEUTROABS 4.0  --   --   --   HGB 10.6* 8.1* 7.2* 7.8*  HCT 35.0* 26.5* 22.9* 24.9*  MCV 120.7* 119.9* 115.7* 115.3*  PLT 135* 281 254 328    Medications:    . aspirin EC  81 mg Oral Daily  . Chlorhexidine Gluconate Cloth  6 each Topical Q0600  . isosorbide mononitrate  60 mg Oral BID  . sevelamer carbonate  1,600 mg Oral TID WC      Otelia Santee, MD 12/06/2020, 7:16 AM

## 2020-12-06 NOTE — Progress Notes (Signed)
PROGRESS NOTE    Shawna [name]  PYY:[id number] DOB: [birth year] DOA: 12/02/2020 PCP: [facility], [facility]   Brief Narrative: Shawna [name] is a 80 y.o. female with a history of paroxysmal atrial fibrillation, end-stage renal disease on HD, chronic diastolic heart failure, hypertension, hyperlipidemia.  Patient presented secondary to unsteadiness on feet and weakness with concern for myositis on admission.  Neurology was consulted on admission.  Initial work-up appears to be negative for myositis.    Assessment & Plan:   Principal Problem:   Myositis Active Problems:   ESRD on hemodialysis (HCC)   HLD (hyperlipidemia)   Chronic diastolic CHF (congestive heart failure) (HCC)   Essential hypertension   Paroxysmal atrial fibrillation (HCC)   Leg weakness Initial concern for myositis. Initial workup appears unremarkable for this diagnosis with normal CK, CRP, mildly elevated ESR and negative femur MRI. MRI brain negative for acute stroke. ANA negative. PT recommending SNF -Neurology recommendations: outpatient EMG, neuropsych evaluation; can consider stopping amiodarone if persisting  Confusion Unknown etiology. Per husband, this has been an issue for the last few days since her fall. Possibly related to a concussion. Patient's TSH is mildly elevated in setting of amiodarone use as an outpatient. Patient does not make urine. No acute abnormalities on head imaging but there is evidence of chronic vascular disease. Stable.  Paroxysmal atrial fibrillation Currently in sinus rhythm. Discussed with cardiology about amiodarone who recommended stopping amiodarone in setting of elevated TSH. Patient is not on anticoagulation in setting of recurrent acute GI bleeding.  ESRD on HD Last HD session on 5/25. Nephrology consulted for inpatient HD while admitted.  Primary hypertension -Continue Imdur  Chronic diastolic heart failure Stable.  Chronic anemia Likely secondary to ESRD.  Baseline hemoglobin of around 8. Hemoglobin down to 7.2 and stable.  Left ankle pain X-ray negative for fracture   DVT prophylaxis: SCDs  Code Status:   Code Status: Full Code Family Communication: None at bedside Disposition Plan: Discharge to SNF in 1-2 days pending bed availability  Consultants:   Neurology  Procedures:   None  Antimicrobials:  None    Subjective: No issues.  Objective: Vitals:   12/06/20 0450 12/06/20 0521 12/06/20 0539 12/06/20 1137  BP:  (!) 182/59 (!) 187/49 (!) 162/47  Pulse: 82  78 73  Resp:   20 18  Temp:    98.7 F (37.1 C)  TempSrc:    Oral  SpO2: (!) 80%   97%  Weight:        Intake/Output Summary (Last 24 hours) at 12/06/2020 1154 Last data filed at 12/06/2020 0900 Gross per 24 hour  Intake 360 ml  Output --  Net 360 ml   Filed Weights   12/05/20 0413 12/05/20 1609 12/06/20 0414  Weight: 55.3 kg 59.5 kg 56.1 kg    Examination:  General exam: Appears calm and comfortable Respiratory system: Clear to auscultation. Respiratory effort normal. Cardiovascular system: S1 & S2 heard, RRR. Gastrointestinal system: Abdomen is nondistended, soft and nontender. No organomegaly or masses felt. Normal bowel sounds heard. Central nervous system: Alert and oriented to person, place. No focal neurological deficits. Musculoskeletal: No edema. No calf tenderness Skin: No cyanosis. No rashes Psychiatry: Judgement and insight appear impaired. Memory impaired. Mood & affect appropriate.     Data Reviewed: I have personally reviewed following labs and imaging studies  CBC Lab Results  Component Value Date   WBC 9.8 12/06/2020   RBC 2.16 (L) 12/06/2020   HGB 7.8 (L)  12/06/2020   HCT 24.9 (L) 12/06/2020   MCV 115.3 (H) 12/06/2020   MCH 36.1 (H) 12/06/2020   PLT 328 12/06/2020   MCHC 31.3 12/06/2020   RDW 17.2 (H) 12/06/2020   LYMPHSABS 1.0 12/02/2020   MONOABS 0.5 12/02/2020   EOSABS 0.4 12/02/2020   BASOSABS 0.1 12/02/2020      Last metabolic panel Lab Results  Component Value Date   NA 134 (L) 12/06/2020   K 4.4 12/06/2020   CL 96 (L) 12/06/2020   CO2 26 12/06/2020   BUN 24 (H) 12/06/2020   CREATININE 6.46 (H) 12/06/2020   GLUCOSE 111 (H) 12/06/2020   GFRNONAA 6 (L) 12/06/2020   GFRAA 9 (L) 02/12/2020   CALCIUM 10.8 (H) 12/06/2020   PHOS 7.5 (H) 12/04/2020   PROT 7.0 11/09/2020   ALBUMIN 3.1 (L) 11/10/2020   LABGLOB 2.8 12/26/2018   AGRATIO 1.4 12/26/2018   BILITOT 0.6 11/09/2020   ALKPHOS 80 11/09/2020   AST 8 (L) 11/09/2020   ALT 13 11/09/2020   ANIONGAP 12 12/06/2020    CBG (last 3)  No results for input(s): GLUCAP in the last 72 hours.   GFR: Estimated Creatinine Clearance: 5.2 mL/min (A) (by C-G formula based on SCr of 6.46 mg/dL (H)).  Coagulation Profile: No results for input(s): INR, PROTIME in the last 168 hours.  Recent Results (from the past 240 hour(s))  SARS CORONAVIRUS 2 (TAT 6-24 HRS) Nasopharyngeal Nasopharyngeal Swab     Status: None   Collection Time: 12/02/20 11:18 PM   Specimen: Nasopharyngeal Swab  Result Value Ref Range Status   SARS Coronavirus 2 NEGATIVE NEGATIVE Final    Comment: (NOTE) SARS-CoV-2 target nucleic acids are NOT DETECTED.  The SARS-CoV-2 RNA is generally detectable in upper and lower respiratory specimens during the acute phase of infection. Negative results do not preclude SARS-CoV-2 infection, do not rule out co-infections with other pathogens, and should not be used as the sole basis for treatment or other patient management decisions. Negative results must be combined with clinical observations, patient history, and epidemiological information. The expected result is Negative.  Fact Sheet for Patients: SugarRoll.be  Fact Sheet for Healthcare Providers: https://www.woods-mathews.com/  This test is not yet approved or cleared by the [place] FDA and  has been authorized for detection  and/or diagnosis of SARS-CoV-2 by FDA under an Emergency Use Authorization (EUA). This EUA will remain  in effect (meaning this test can be used) for the duration of the COVID-19 declaration under Se ction 564(b)(1) of the Act, 21 U.S.C. section 360bbb-3(b)(1), unless the authorization is terminated or revoked sooner.  Performed at [facility], [place] [address], [place], [place] [address]         Radiology Studies: DG Ankle 2 Views Left  Result Date: 12/05/2020 CLINICAL DATA:  80 year old female with left foot pain. EXAM: LEFT ANKLE - 2 VIEW COMPARISON:  None. FINDINGS: There is no acute fracture or dislocation. The bones are osteopenic. The ankle mortise is intact. The soft tissues are unremarkable. Vascular calcifications noted. IMPRESSION: No acute fracture or dislocation. Electronically Signed   By: Anner Crete M.D.   On: 12/05/2020 17:20        Scheduled Meds: . aspirin EC  81 mg Oral Daily  . Chlorhexidine Gluconate Cloth  6 each Topical Q0600  . isosorbide mononitrate  60 mg Oral BID  . sevelamer carbonate  1,600 mg Oral TID WC   Continuous Infusions:    LOS: 4 days  Cordelia Poche, MD [facility] 12/06/2020, 11:54 AM  If 7PM-7AM, please contact night-coverage [email or web address]

## 2020-12-06 NOTE — TOC Progression Note (Addendum)
Transition of Care [facility]) - Progression Note    Patient Details  Name: JOHNNETTE LAUX MRN: [id number] Date of Birth: 08/30/1940  Transition of Care [facility]) CM/SW Contact  Elliot Gurney [place], [place] Phone Number: [phone] 12/06/2020, 12:34 PM  Clinical Narrative:     Phone call to [facility] message with Ebony Hail regarding placement today. VM left requesting a return call.  2:42pm Phone call to Denese Killings to discuss discharge plan. Per Ebony Hail, still waiting on insurance authorization before she can admit.  [address] LCSW Transition of Care [phone]     Expected Discharge Plan: Skilled Nursing Facility Barriers to Discharge: [place] Work up,SNF Pending bed offer  Expected Discharge Plan and Services Expected Discharge Plan: [place] In-house Referral: Clinical Social Work   Post Acute Care Choice: [place] Living arrangements for the past 2 months: Single Family Home                                       Social Determinants of Health (SDOH) Interventions    Readmission Risk Interventions Readmission Risk Prevention Plan 11/11/2020 10/04/2020 09/02/2020  Transportation Screening Complete Complete Complete  PCP or Specialist Appt within 3-5 Days - - -  [place] or [place] Work Consult for [place] - - -  Medication Review Press photographer) Complete Complete Complete  PCP or Specialist appointment within 3-5 days of discharge Complete Complete -  [place] or [place] Complete Complete Complete  SW Recovery Care/Counseling Consult Complete Complete Complete  Palliative Care Screening Not Applicable Not Applicable [place] Not Applicable Complete Patient Refused  Some recent data might be hidden

## 2020-12-07 LAB — HEPATITIS B SURFACE ANTIBODY,QUALITATIVE: Hep B S Ab: REACTIVE — AB

## 2020-12-07 LAB — HEPATITIS B CORE ANTIBODY, TOTAL: Hep B Core Total Ab: NONREACTIVE

## 2020-12-07 LAB — HEPATITIS B SURFACE ANTIGEN: Hepatitis B Surface Ag: NONREACTIVE

## 2020-12-07 MED ORDER — DARBEPOETIN ALFA 60 MCG/0.3ML IJ SOSY
PREFILLED_SYRINGE | INTRAMUSCULAR | Status: AC
  Filled 2020-12-07: qty 0.3

## 2020-12-07 MED ORDER — DARBEPOETIN ALFA 60 MCG/0.3ML IJ SOSY
60.0000 ug | PREFILLED_SYRINGE | INTRAMUSCULAR | Status: DC
  Administered 2020-12-07: 60 ug via INTRAVENOUS
  Filled 2020-12-07: qty 0.3

## 2020-12-07 NOTE — Progress Notes (Signed)
Patient arrived back to 5w14 from dialysis. Pt is comfortable. Will continue to monitor pt  Racheal Patches RN

## 2020-12-07 NOTE — Progress Notes (Addendum)
[facility] Progress Note   Subjective:   Seen in room, awakens to voice and very pleasant but quickly falls back asleep when not talking. Denies SOB, CP, palpitations, dizziness, abdominal pain and nausea.   Objective Vitals:   12/06/20 2344 12/07/20 0415 12/07/20 0442 12/07/20 0731  BP: (!) 151/67 (!) 180/53  (!) 186/73  Pulse: 82 81  80  Resp: 20 (!) 24  19  Temp: 98.3 F (36.8 C) 98.2 F (36.8 C)  99.5 F (37.5 C)  TempSrc: Oral Oral  Axillary  SpO2: 92% 91%  96%  Weight:   56.8 kg    Physical Exam General: WDWN female in NAD Heart: RRR, no murmurs Lungs: CTA bilaterally without wheezing, rhonchi or rales Abdomen: Soft, non-distended, +BS Extremities: No edema b/l lower extremiteis Dialysis Access: LUE AVF + t/b  Additional Objective Labs: Basic Metabolic Panel: Recent Labs  Lab 12/03/20 0201 12/04/20 0837 12/06/20 0103  NA 136  --  134*  K 3.8  --  4.4  CL 97*  --  96*  CO2 29  --  26  GLUCOSE 140*  --  111*  BUN 12  --  24*  CREATININE 4.81*  --  6.46*  CALCIUM 10.0  --  10.8*  PHOS  --  7.5*  --    CBC: Recent Labs  Lab 12/02/20 1746 12/03/20 0201 12/05/20 0618 12/06/20 0103  WBC 6.0 7.9 6.4 9.8  NEUTROABS 4.0  --   --   --   HGB 10.6* 8.1* 7.2* 7.8*  HCT 35.0* 26.5* 22.9* 24.9*  MCV 120.7* 119.9* 115.7* 115.3*  PLT 135* 281 254 328   Blood Culture    Component Value Date/Time   SDES BLOOD RIGHT ARM 10/10/2016 0124   SPECREQUEST IN PEDIATRIC BOTTLE Blood Culture adequate volume 10/10/2016 0124   CULT NO GROWTH 5 DAYS 10/10/2016 0124   REPTSTATUS 10/15/2016 FINAL 10/10/2016 0124    Cardiac Enzymes: Recent Labs  Lab 12/03/20 0201  CKTOTAL 47   Studies/Results: DG Ankle 2 Views Left  Result Date: 12/05/2020 CLINICAL DATA:  80 year old female with left foot pain. EXAM: LEFT ANKLE - 2 VIEW COMPARISON:  None. FINDINGS: There is no acute fracture or dislocation. The bones are osteopenic. The ankle mortise is intact. The  soft tissues are unremarkable. Vascular calcifications noted. IMPRESSION: No acute fracture or dislocation. Electronically Signed   By: Anner Crete M.D.   On: 12/05/2020 17:20   Medications:  . aspirin EC  81 mg Oral Daily  . Chlorhexidine Gluconate Cloth  6 each Topical Q0600  . isosorbide mononitrate  60 mg Oral BID  . sevelamer carbonate  1,600 mg Oral TID WC    Outpatient Dialysis Orders: [facility]. EDW61kgHD Bath 2K/2.5CaTime 3.5 hrsHeparin none. AccessLUE AVFBFR Nipro Cellentia 17h Blood flow300DFR500  Hectoral 59mcg IV/HD  Epogen 8000Units IV/HDgiven 12/02/20  Assessment/Plan:   1. ESRD - Tolerated HD on 5/27 with net UF 1.8L  - Planned for HD today, continue MWF schedule 2. Hypertension/volume-h/o noncomplaince with medications and has come in w/ hypertensive urgency. ResumedHD and meds, BP still moderately elevated, may need to titrate meds if BP does not come down will with HD.  3. Anemia-Hgb 7.8, no bleeding reported. Will start aranesp with HD today (previously on epogen) 4. Metabolic bone disease-Calcium elevated, not on VDRA. Will use low Ca bath. Phos level 7.5. on renvela 2 tabs TIDM, possibly high due to med noncompliance, follow trend.  5. Weakness-neurology on board; suspecting statin but  she has also been on for many years.  Recent admission for weakness which improved with discontinuation of gabapentin + RRT. She feels the weakness is much better. Neuro recommends cont holding statin, outpatient EMG and neuropsych eval. 6. Nutrition-renal diet 7. H/o myoclonus - Do not resume gabapentin as likely source of myoclonus and leg weakness-> improved after gabapentin was stopped last admission + RRT. 8. DM type 2 - per primary 9. Vascular access - have had issues with avf inpast but working well here so far.  10. Dispo - awaiting SNF   Anice Paganini, PA-C 12/07/2020, 9:51 AM  [facility] Pager: [phone]  Pt seen, examined and agree w assess/plan as above with additions as indicated.  [facility] 12/07/2020, 1:47 PM

## 2020-12-07 NOTE — Progress Notes (Signed)
Physical Therapy Treatment Patient Details Name: Shawna [name] MRN: [id number] DOB: 09-28-1940 Today's Date: 12/07/2020    History of Present Illness Pt is an 80 y/o female admitted 5/25 secondary to inability to walk and BLE weakness (LLE>RLE). Thought to be secondary to myopathy per neuro notes. PMH includes ESRD on HD, HTN, a fib, CAD, dCHF.    PT Comments    Pt unable to really participate today.  Was Max A transfer to EOB and sat at EOB for 10 mins.  Limited due to not following commands/confusion.  Attempted multiple methods to encourage and facilitate or initiate but pt not attempting to stand, do exercises, or scoot.  Progress as able.      Follow Up Recommendations  SNF     Equipment Recommendations  Wheelchair (measurements PT);Wheelchair cushion (measurements PT)    Recommendations for Other Services       Precautions / Restrictions Precautions Precautions: Fall Precaution Comments: Has had multiple falls at home.    Mobility  Bed Mobility Overal bed mobility: Needs Assistance Bed Mobility: Supine to Sit     Supine to sit: Max assist  Sit to supine: total     General bed mobility comments: Assist with legs and trunk; pt with very little effort    Transfers                 General transfer comment: Pt would not assist.  Attempted multiple methods to encourage but unable even with therapist assisting.  Just wants to lay down.  Ambulation/Gait                 Stairs             Wheelchair Mobility    Modified Rankin (Stroke Patients Only)       Balance Overall balance assessment: Needs assistance Sitting-balance support: Feet supported;No upper extremity supported Sitting balance-Leahy Scale: Fair Sitting balance - Comments: Pt sat EOB for 10 mins without support.  Tried encouraging standing, scooting, washing face, drinking, eating while sitting but pt not participating with any.  Unable to express what she wants to do.                                     Cognition Arousal/Alertness: Awake/alert Behavior During Therapy: Flat affect Overall Cognitive Status: No family/caregiver present to determine baseline cognitive functioning                                 General Comments: Pt following very limited commands despite multimodal cues (only turning head to look at therapist when asked).  Not able to attend enough to carry on conversation -just speaking random sentences.  Tends to look L and turn body L; however, Could look R when therapist talked to her on R side and pt did look R to pillow when asked if she wanted to lay down. Noted MRI performed with no acute changes      Exercises      General Comments General comments (skin integrity, edema, etc.): VSS      Pertinent Vitals/Pain Pain Assessment: No/denies pain    Home Living                      Prior Function            PT Goals (current  goals can now be found in the care plan section) Progress towards PT goals: Not progressing toward goals - comment (cognition limited)    Frequency    Min 2X/week      PT Plan Current plan remains appropriate    Co-evaluation              AM-PAC PT "6 Clicks" Mobility   Outcome Measure  Help needed turning from your back to your side while in a flat bed without using bedrails?: A Little Help needed moving from lying on your back to sitting on the side of a flat bed without using bedrails?: A Lot Help needed moving to and from a bed to a chair (including a wheelchair)?: Total Help needed standing up from a chair using your arms (e.g., wheelchair or bedside chair)?: Total Help needed to walk in hospital room?: Total Help needed climbing 3-5 steps with a railing? : Total 6 Click Score: 9    End of Session Equipment Utilized During Treatment: Gait belt Activity Tolerance:  (limited by cognition) Patient left: in bed;with call bell/phone within reach;with  bed alarm set Nurse Communication: Mobility status PT Visit Diagnosis: Unsteadiness on feet (R26.81);Muscle weakness (generalized) (M62.81)     Time: 1100-1120 PT Time Calculation (min) (ACUTE ONLY): 20 min  Charges:  $Therapeutic Activity: 8-22 mins                     Abran Richard, PT Acute Rehab Services Pager [phone] [facility] 12/07/2020, 11:28 AM

## 2020-12-07 NOTE — Progress Notes (Signed)
PROGRESS NOTE    Shawna [name]  OXB:[id number] DOB: 1941-06-23 DOA: 12/02/2020 PCP: [facility]   Brief Narrative: Shawna [name] is a 80 y.o. female with a history of paroxysmal atrial fibrillation, end-stage renal disease on HD, chronic diastolic heart failure, hypertension, hyperlipidemia.  Patient presented secondary to unsteadiness on feet and weakness with concern for myositis on admission.  Neurology was consulted on admission.  Initial work-up appears to be negative for myositis.    Assessment & Plan:   Principal Problem:   Myositis Active Problems:   ESRD on hemodialysis (HCC)   HLD (hyperlipidemia)   Chronic diastolic CHF (congestive heart failure) (HCC)   Essential hypertension   Paroxysmal atrial fibrillation (HCC)   Leg weakness Initial concern for myositis. Initial workup appears unremarkable for this diagnosis with normal CK, CRP, mildly elevated ESR and negative femur MRI. MRI brain negative for acute stroke. ANA negative. PT recommending SNF -Neurology recommendations: outpatient EMG, neuropsych evaluation; can consider stopping amiodarone if persisting  Confusion Unknown etiology. Per husband, this has been an issue for the last few days since her fall. Possibly related to a concussion. Patient's TSH is mildly elevated in setting of amiodarone use as an outpatient. Patient does not make urine. No acute abnormalities on head imaging but there is evidence of chronic vascular disease. Stable.  Paroxysmal atrial fibrillation Currently in sinus rhythm. Discussed with cardiology about amiodarone who recommended stopping amiodarone in setting of elevated TSH. Patient is not on anticoagulation in setting of recurrent acute GI bleeding.  ESRD on HD Last HD session on 5/25. Nephrology consulted for inpatient HD while admitted.  Primary hypertension -Continue Imdur  Chronic diastolic heart failure Stable.  Chronic anemia Likely secondary to ESRD.  Baseline hemoglobin of around 8. Hemoglobin down to 7.2 and stable.  Left ankle pain X-ray negative for fracture   DVT prophylaxis: SCDs  Code Status:   Code Status: Full Code Family Communication: None at bedside Disposition Plan:  Stable for discharge. Awaiting SNF bed for discharge.  Consultants:   Neurology  Procedures:   None  Antimicrobials:  None    Subjective: No concerns today.  Objective: Vitals:   12/06/20 2344 12/07/20 0415 12/07/20 0442 12/07/20 0731  BP: (!) 151/67 (!) 180/53  (!) 186/73  Pulse: 82 81  80  Resp: 20 (!) 24  19  Temp: 98.3 F (36.8 C) 98.2 F (36.8 C)  99.5 F (37.5 C)  TempSrc: Oral Oral  Axillary  SpO2: 92% 91%  96%  Weight:   56.8 kg     Intake/Output Summary (Last 24 hours) at 12/07/2020 1303 Last data filed at 12/07/2020 0900 Gross per 24 hour  Intake 200 ml  Output 0 ml  Net 200 ml   Filed Weights   12/05/20 1609 12/06/20 0414 12/07/20 0442  Weight: 59.5 kg 56.1 kg 56.8 kg    Examination:  General exam: Appears calm and comfortable Respiratory system: Clear to auscultation. Respiratory effort normal. Cardiovascular system: S1 & S2 heard, RRR. No murmurs, rubs, gallops or clicks. Gastrointestinal system: Abdomen is nondistended, soft and nontender. No organomegaly or masses felt. Normal bowel sounds heard. Central nervous system: Somnolent but easily arouses. Oriented to person. Was not oriented to place or time today. Musculoskeletal: No edema. No calf tenderness Skin: No cyanosis. No rashes Psychiatry: Judgement and insight appear impaired.    Data Reviewed: I have personally reviewed following labs and imaging studies  CBC Lab Results  Component Value Date   WBC  9.8 12/06/2020   RBC 2.16 (L) 12/06/2020   HGB 7.8 (L) 12/06/2020   HCT 24.9 (L) 12/06/2020   MCV 115.3 (H) 12/06/2020   MCH 36.1 (H) 12/06/2020   PLT 328 12/06/2020   MCHC 31.3 12/06/2020   RDW 17.2 (H) 12/06/2020   LYMPHSABS 1.0 12/02/2020    MONOABS 0.5 12/02/2020   EOSABS 0.4 12/02/2020   BASOSABS 0.1 43/56/8616     Last metabolic panel Lab Results  Component Value Date   NA 134 (L) 12/06/2020   K 4.4 12/06/2020   CL 96 (L) 12/06/2020   CO2 26 12/06/2020   BUN 24 (H) 12/06/2020   CREATININE 6.46 (H) 12/06/2020   GLUCOSE 111 (H) 12/06/2020   GFRNONAA 6 (L) 12/06/2020   GFRAA 9 (L) 02/12/2020   CALCIUM 10.8 (H) 12/06/2020   PHOS 7.5 (H) 12/04/2020   PROT 7.0 11/09/2020   ALBUMIN 3.1 (L) 11/10/2020   LABGLOB 2.8 12/26/2018   AGRATIO 1.4 12/26/2018   BILITOT 0.6 11/09/2020   ALKPHOS 80 11/09/2020   AST 8 (L) 11/09/2020   ALT 13 11/09/2020   ANIONGAP 12 12/06/2020    CBG (last 3)  No results for input(s): GLUCAP in the last 72 hours.   GFR: Estimated Creatinine Clearance: 5.2 mL/min (A) (by C-G formula based on SCr of 6.46 mg/dL (H)).  Coagulation Profile: No results for input(s): INR, PROTIME in the last 168 hours.  Recent Results (from the past 240 hour(s))  SARS CORONAVIRUS 2 (TAT 6-24 HRS) Nasopharyngeal Nasopharyngeal Swab     Status: None   Collection Time: 12/02/20 11:18 PM   Specimen: Nasopharyngeal Swab  Result Value Ref Range Status   SARS Coronavirus 2 NEGATIVE NEGATIVE Final    Comment: (NOTE) SARS-CoV-2 target nucleic acids are NOT DETECTED.  The SARS-CoV-2 RNA is generally detectable in upper and lower respiratory specimens during the acute phase of infection. Negative results do not preclude SARS-CoV-2 infection, do not rule out co-infections with other pathogens, and should not be used as the sole basis for treatment or other patient management decisions. Negative results must be combined with clinical observations, patient history, and epidemiological information. The expected result is Negative.  Fact Sheet for Patients: SugarRoll.be  Fact Sheet for Healthcare Providers: https://www.woods-mathews.com/  This test is not yet approved or  cleared by the [place] FDA and  has been authorized for detection and/or diagnosis of SARS-CoV-2 by FDA under an Emergency Use Authorization (EUA). This EUA will remain  in effect (meaning this test can be used) for the duration of the COVID-19 declaration under Se ction 564(b)(1) of the Act, 21 U.S.C. section 360bbb-3(b)(1), unless the authorization is terminated or revoked sooner.  Performed at [facility], [place] [address], [place], [place] [address]         Radiology Studies: DG Ankle 2 Views Left  Result Date: 12/05/2020 CLINICAL DATA:  80 year old female with left foot pain. EXAM: LEFT ANKLE - 2 VIEW COMPARISON:  None. FINDINGS: There is no acute fracture or dislocation. The bones are osteopenic. The ankle mortise is intact. The soft tissues are unremarkable. Vascular calcifications noted. IMPRESSION: No acute fracture or dislocation. Electronically Signed   By: Anner Crete M.D.   On: 12/05/2020 17:20        Scheduled Meds: . aspirin EC  81 mg Oral Daily  . Chlorhexidine Gluconate Cloth  6 each Topical Q0600  . darbepoetin (ARANESP) injection - DIALYSIS  60 mcg Intravenous Q Mon-HD  . isosorbide mononitrate  60 mg Oral BID  . sevelamer carbonate  1,600 mg Oral TID WC   Continuous Infusions:    LOS: 5 days     Cordelia Poche, MD [facility] 12/07/2020, 1:03 PM  If 7PM-7AM, please contact night-coverage [email or web address]

## 2020-12-08 LAB — PROTEIN ELECTROPHORESIS, SERUM
A/G Ratio: 1.2 (ref 0.7–1.7)
Albumin ELP: 3.1 g/dL (ref 2.9–4.4)
Alpha-1-Globulin: 0.3 g/dL (ref 0.0–0.4)
Alpha-2-Globulin: 0.7 g/dL (ref 0.4–1.0)
Beta Globulin: 0.7 g/dL (ref 0.7–1.3)
Gamma Globulin: 0.9 g/dL (ref 0.4–1.8)
Globulin, Total: 2.5 g/dL (ref 2.2–3.9)
Total Protein ELP: 5.6 g/dL — ABNORMAL LOW (ref 6.0–8.5)

## 2020-12-08 MED ORDER — CHLORHEXIDINE GLUCONATE CLOTH 2 % EX PADS
6.0000 | MEDICATED_PAD | Freq: Every day | CUTANEOUS | Status: DC
  Administered 2020-12-08 – 2020-12-10 (×3): 6 via TOPICAL

## 2020-12-08 NOTE — Care Management Important Message (Signed)
Important Message  Patient Details  Name: Shawna [name] MRN: [id number] Date of Birth: [birth year]   Medicare Important Message Given:  Yes - Important Message mailed due to current National Emergency   Verbal consent obtained due to current National Emergency  Relationship to patient: Self Contact Name: Delcenia Call Date: 12/08/20  Time: 1324 Phone: [phone] Outcome: Spoke with contact Important Message mailed to: Patient address on file    [name] 12/08/2020, 1:24 PM

## 2020-12-08 NOTE — Progress Notes (Signed)
PROGRESS NOTE    Shawna [name]  QQP:[id number] DOB: 1940-07-16 DOA: 12/02/2020 PCP: [facility]   Brief Narrative: Shawna [name] is a 80 y.o. female with a history of paroxysmal atrial fibrillation, end-stage renal disease on HD, chronic diastolic heart failure, hypertension, hyperlipidemia.  Patient presented secondary to unsteadiness on feet and weakness with concern for myositis on admission.  Neurology was consulted on admission.  Initial work-up appears to be negative for myositis.    Assessment & Plan:   Principal Problem:   Myositis Active Problems:   ESRD on hemodialysis (HCC)   HLD (hyperlipidemia)   Chronic diastolic CHF (congestive heart failure) (HCC)   Essential hypertension   Paroxysmal atrial fibrillation (HCC)   Leg weakness Initial concern for myositis. Initial workup appears unremarkable for this diagnosis with normal CK, CRP, mildly elevated ESR and negative femur MRI. MRI brain negative for acute stroke. ANA negative. PT recommending SNF -Neurology recommendations: outpatient EMG, neuropsych evaluation; can consider stopping amiodarone if persisting  Confusion Unknown etiology. Per husband, this has been an issue for the last few days since her fall. Possibly related to a concussion. Patient's TSH is mildly elevated in setting of amiodarone use as an outpatient. Patient does not make urine. No acute abnormalities on head imaging but there is evidence of chronic vascular disease. Waxes and wanes.  Paroxysmal atrial fibrillation Currently in sinus rhythm. Discussed with cardiology about amiodarone who recommended stopping amiodarone in setting of elevated TSH. Patient is not on anticoagulation in setting of recurrent acute GI bleeding.  ESRD on HD Last HD session on 5/25. Nephrology consulted for inpatient HD while admitted.  Primary hypertension -Continue Imdur  Chronic diastolic heart failure Stable.  Chronic anemia Likely secondary to  ESRD. Baseline hemoglobin of around 8. Hemoglobin down to 7.2 and stable.  Left ankle pain X-ray negative for fracture. -ACE bandage for comfort   DVT prophylaxis: SCDs  Code Status:   Code Status: Full Code Family Communication: None at bedside Disposition Plan:  Stable for discharge. Awaiting SNF bed for discharge. Will need neuropsychiatry evaluation as an outpatient  Consultants:   Neurology  Procedures:   None  Antimicrobials:  None    Subjective: No concerns but confused.  Objective: Vitals:   12/07/20 1900 12/07/20 1915 12/07/20 2005 12/08/20 0420  BP: (!) 146/50 (!) 144/49 (!) 176/36 (!) 168/50  Pulse: 76 83 85 83  Resp: 20 (!) 21 18 18   Temp:  (!) 97.5 F (36.4 C) 97.8 F (36.6 C) 98.7 F (37.1 C)  TempSrc:  Oral Oral Oral  SpO2:   95% 94%  Weight:    53.8 kg    Intake/Output Summary (Last 24 hours) at 12/08/2020 1343 Last data filed at 12/07/2020 1915 Gross per 24 hour  Intake 40 ml  Output 1400 ml  Net -1360 ml   Filed Weights   12/07/20 0442 12/07/20 1500 12/08/20 0420  Weight: 56.8 kg 54.8 kg 53.8 kg    Examination:  General exam: Appears calm and comfortable Respiratory system: Clear to auscultation. Respiratory effort normal. Cardiovascular system: S1 & S2 heard, RRR. No murmurs, rubs, gallops or clicks. Gastrointestinal system: Abdomen is nondistended, soft and nontender. No organomegaly or masses felt. Normal bowel sounds heard. Central nervous system: Alert and oriented to self. Musculoskeletal: No edema. No calf tenderness Skin: No cyanosis. No rashes Psychiatry: Judgement and insight appear impaired. Memory impaired. Affect was somewhat labile with bursts of laughter   Data Reviewed: I have personally reviewed following  labs and imaging studies  CBC Lab Results  Component Value Date   WBC 9.8 12/06/2020   RBC 2.16 (L) 12/06/2020   HGB 7.8 (L) 12/06/2020   HCT 24.9 (L) 12/06/2020   MCV 115.3 (H) 12/06/2020   MCH 36.1 (H)  12/06/2020   PLT 328 12/06/2020   MCHC 31.3 12/06/2020   RDW 17.2 (H) 12/06/2020   LYMPHSABS 1.0 12/02/2020   MONOABS 0.5 12/02/2020   EOSABS 0.4 12/02/2020   BASOSABS 0.1 80/99/8338     Last metabolic panel Lab Results  Component Value Date   NA 134 (L) 12/06/2020   K 4.4 12/06/2020   CL 96 (L) 12/06/2020   CO2 26 12/06/2020   BUN 24 (H) 12/06/2020   CREATININE 6.46 (H) 12/06/2020   GLUCOSE 111 (H) 12/06/2020   GFRNONAA 6 (L) 12/06/2020   GFRAA 9 (L) 02/12/2020   CALCIUM 10.8 (H) 12/06/2020   PHOS 7.5 (H) 12/04/2020   PROT 7.0 11/09/2020   ALBUMIN 3.1 (L) 11/10/2020   LABGLOB 2.8 12/26/2018   AGRATIO 1.4 12/26/2018   BILITOT 0.6 11/09/2020   ALKPHOS 80 11/09/2020   AST 8 (L) 11/09/2020   ALT 13 11/09/2020   ANIONGAP 12 12/06/2020    CBG (last 3)  No results for input(s): GLUCAP in the last 72 hours.   GFR: Estimated Creatinine Clearance: 5.2 mL/min (A) (by C-G formula based on SCr of 6.46 mg/dL (H)).  Coagulation Profile: No results for input(s): INR, PROTIME in the last 168 hours.  Recent Results (from the past 240 hour(s))  SARS CORONAVIRUS 2 (TAT 6-24 HRS) Nasopharyngeal Nasopharyngeal Swab     Status: None   Collection Time: 12/02/20 11:18 PM   Specimen: Nasopharyngeal Swab  Result Value Ref Range Status   SARS Coronavirus 2 NEGATIVE NEGATIVE Final    Comment: (NOTE) SARS-CoV-2 target nucleic acids are NOT DETECTED.  The SARS-CoV-2 RNA is generally detectable in upper and lower respiratory specimens during the acute phase of infection. Negative results do not preclude SARS-CoV-2 infection, do not rule out co-infections with other pathogens, and should not be used as the sole basis for treatment or other patient management decisions. Negative results must be combined with clinical observations, patient history, and epidemiological information. The expected result is Negative.  Fact Sheet for  Patients: SugarRoll.be  Fact Sheet for Healthcare Providers: https://www.woods-mathews.com/  This test is not yet approved or cleared by the [place] FDA and  has been authorized for detection and/or diagnosis of SARS-CoV-2 by FDA under an Emergency Use Authorization (EUA). This EUA will remain  in effect (meaning this test can be used) for the duration of the COVID-19 declaration under Se ction 564(b)(1) of the Act, 21 U.S.C. section 360bbb-3(b)(1), unless the authorization is terminated or revoked sooner.  Performed at [facility], [place] [address], [place], [place] [address]         Radiology Studies: No results found.      Scheduled Meds: . aspirin EC  81 mg Oral Daily  . Chlorhexidine Gluconate Cloth  6 each Topical Q0600  . Chlorhexidine Gluconate Cloth  6 each Topical Q0600  . darbepoetin (ARANESP) injection - DIALYSIS  60 mcg Intravenous Q Mon-HD  . isosorbide mononitrate  60 mg Oral BID  . sevelamer carbonate  1,600 mg Oral TID WC   Continuous Infusions:    LOS: 6 days     Cordelia Poche, MD [facility] 12/08/2020, 1:43 PM  If 7PM-7AM, please contact night-coverage [email or web address]

## 2020-12-08 NOTE — TOC Progression Note (Addendum)
Transition of Care [facility]) - Progression Note    Patient Details  Name: Shawna [name] MRN: [id number] Date of Birth: 04-Aug-1940  Transition of [facility] CM/SW [place], LCSW Phone Number: 12/08/2020, 9:04 AM  Clinical Narrative:    CSW still awaiting insurance approval from [facility]. Pasrr received and placed on FL2. CSW updated patient's spouse; he is aware he needs to bring patient's COVID vaccine card to the facility.    Expected Discharge Plan: Skilled Nursing Facility Barriers to Discharge: [place] Work up,SNF Pending bed offer  Expected Discharge Plan and Services Expected Discharge Plan: [place] In-house Referral: Clinical Social Work   Post Acute Care Choice: [place] Living arrangements for the past 2 months: Single Family Home                                       Social Determinants of Health (SDOH) Interventions    Readmission Risk Interventions Readmission Risk Prevention Plan 11/11/2020 10/04/2020 09/02/2020  Transportation Screening Complete Complete Complete  PCP or Specialist Appt within 3-5 Days - - -  HRI or [place] Work Consult for [place] - - -  Medication Review Press photographer) Complete Complete Complete  PCP or Specialist appointment within 3-5 days of discharge Complete Complete -  [place] or [place] Complete Complete Complete  SW Recovery Care/Counseling Consult Complete Complete Complete  Palliative Care Screening Not Applicable Not Applicable Not [place] Not Applicable Complete Patient Refused  Some recent data might be hidden

## 2020-12-08 NOTE — Progress Notes (Signed)
Patient's jewelry (3 rings) were found on the bedside table - Rings returned to Husband.

## 2020-12-08 NOTE — Progress Notes (Signed)
Occupational Therapy Treatment Patient Details Name: Shawna [name] MRN: [id number] DOB: 19-May-1941 Today's Date: 12/08/2020    History of present illness Pt is an 80 y/o female admitted 5/25 secondary to inability to walk and BLE weakness (LLE>RLE). Thought to be secondary to myopathy per neuro notes. PMH includes ESRD on HD, HTN, a fib, CAD, dCHF.   OT comments  Pt received in bed, reports feeling sleepy. Disoriented to place, time and situation. Pt encourage to sit EOB with min A, pt able to reach for grab bar with R hand to initiate transition but multimodal cueing for use of LUE to assist with transition. Pt seated EOB to engage in self care task of washing face with set up. Cues for redirection due to inattention, pt given flip phone to identify how to manage and use item. Pt able to open phone and is aware of phone numbers on back of phone but unable to dial numbers. Pt did tolerate sitting EOB longer than prior session, however, still confused and unable to perform simple commands such as scooting to L side. Pt will benefit from continued acute and skilled OT to maximize independence with ADLs and to increase safety prior to DC. DC and freq remains the same.    Follow Up Recommendations  SNF;Supervision/Assistance - 24 hour    Equipment Recommendations  None recommended by OT    Recommendations for Other Services      Precautions / Restrictions Precautions Precautions: Fall Precaution Comments: Has had multiple falls at home. Restrictions Weight Bearing Restrictions: No       Mobility Bed Mobility Overal bed mobility: Needs Assistance Bed Mobility: Supine to Sit     Supine to sit: Min assist Sit to supine: Mod assist   General bed mobility comments: supine to sit, Min A for cueing and mulimodal cues to bring LUE for grab bar on R side to transition to sit EOB. Sit to supine, mod A to management BLE and repositioned.    Transfers Overall transfer level: Needs  assistance               General transfer comment: deterred due to inattention, lethargy and several cues for redirection.    Balance Overall balance assessment: Needs assistance Sitting-balance support: Feet supported;No upper extremity supported Sitting balance-Leahy Scale: Fair Sitting balance - Comments: Pt sat EOB without support.  Tried encouraging scooting, pt not participating. Unable to express what she wants to do. really fixated on if she is in [place] or [name].   Standing balance support: Bilateral upper extremity supported;During functional activity Standing balance-Leahy Scale: Poor Standing balance comment: Reliant on UE and external support                           ADL either performed or assessed with clinical judgement   ADL Overall ADL's : Needs assistance/impaired     Grooming: Sitting;Set up;Wash/dry face Grooming Details (indicate cue type and reason): heavy cues for reminders for direction given while seated EOB.                               General ADL Comments: Session limited to only ADL of washing face due to limitations with attention and following simple commands. Heavy reminders throughout session and redirection.     Vision       Quarry manager  Arousal/Alertness: Awake/alert Behavior During Therapy: Flat affect Overall Cognitive Status: No family/caregiver present to determine baseline cognitive functioning                                 General Comments: Pt following very limited commands despite multimodal cues (only turning head to look at therapist when asked).  Not able to attend enough to carry on conversation -just speaking random sentences. Was able to answer and speak with person, however, unsure of accurate conversation. Pt reports husband is currently in the hospital, yet pt is disoriented and unaware that she is at [facility]. Believes she is [place].         Exercises Exercises: General Lower Extremity General Exercises - Lower Extremity Hip Flexion/Marching: AROM;Both;5 reps (encouraged but pt not motivated to perform exercises.)   Shoulder Instructions       General Comments VSS    Pertinent Vitals/ Pain       Pain Assessment: No/denies pain  Home Living                                          Prior Functioning/Environment              Frequency  Min 2X/week        Progress Toward Goals  OT Goals(current goals can now be found in the care plan section)  Progress towards OT goals: Not progressing toward goals - comment (limited with confusion and following simple directions.)  Acute Rehab OT Goals Patient Stated Goal: to go home OT Goal Formulation: With patient Time For Goal Achievement: 12/17/20 Potential to Achieve Goals: Good ADL Goals Pt Will Perform Grooming: standing;with min guard assist Pt Will Perform Upper Body Bathing: with min assist;sitting Pt Will Perform Upper Body Dressing: with set-up;sitting Pt Will Transfer to Toilet: with min guard assist;ambulating;bedside commode (over toilet) Pt Will Perform Toileting - Clothing Manipulation and hygiene: with min guard assist;sit to/from stand Additional ADL Goal #1: Pt will perform bed mobility with supervision in preparation for ADL. Additional ADL Goal #2: Pt will demonstrate improved memory by demonstrating awareness of how to call for nursing assistance.  Plan Discharge plan remains appropriate;Frequency remains appropriate    Co-evaluation                 AM-PAC OT "6 Clicks" Daily Activity     Outcome Measure   Help from another person eating meals?: A Little Help from another person taking care of personal grooming?: A Little Help from another person toileting, which includes using toliet, bedpan, or urinal?: Total Help from another person bathing (including washing, rinsing, drying)?: A Lot Help from another  person to put on and taking off regular upper body clothing?: A Lot Help from another person to put on and taking off regular lower body clothing?: Total 6 Click Score: 12    End of Session    OT Visit Diagnosis: Unsteadiness on feet (R26.81);Muscle weakness (generalized) (M62.81);Other symptoms and signs involving cognitive function   Activity Tolerance Patient limited by fatigue   Patient Left in chair;with call bell/phone within reach;with chair alarm set   Nurse Communication Mobility status (had BM)        Time: 3875-6433 OT Time Calculation (min): 33 min  Charges: OT General Charges $OT Visit: 1 Visit OT Treatments $Self Care/Home  Management : 23-37 mins  Minus Breeding, MSOT, OTR/L  Supplemental Rehabilitation Services  [phone]    Marius Ditch 12/08/2020, 2:31 PM

## 2020-12-08 NOTE — Progress Notes (Signed)
[facility] Progress Note   Subjective:   Reports feeling well, no concerns this AM. Denies SOB, CP, palpitations, dizziness, and nausea.   Objective Vitals:   12/07/20 1900 12/07/20 1915 12/07/20 2005 12/08/20 0420  BP: (!) 146/50 (!) 144/49 (!) 176/36 (!) 168/50  Pulse: 76 83 85 83  Resp: 20 (!) 21 18 18   Temp:  (!) 97.5 F (36.4 C) 97.8 F (36.6 C) 98.7 F (37.1 C)  TempSrc:  Oral Oral Oral  SpO2:   95% 94%  Weight:    53.8 kg   Physical Exam  General: WDWN female in NAD Heart: RRR, no murmurs Lungs: CTA bilaterally without wheezing, rhonchi or rales Abdomen: Soft, non-distended, +BS Extremities: No edema b/l lower extremities Dialysis Access: LUE AVF + t/b  Additional Objective Labs: Basic Metabolic Panel: Recent Labs  Lab 12/03/20 0201 12/04/20 0837 12/06/20 0103  NA 136  --  134*  K 3.8  --  4.4  CL 97*  --  96*  CO2 29  --  26  GLUCOSE 140*  --  111*  BUN 12  --  24*  CREATININE 4.81*  --  6.46*  CALCIUM 10.0  --  10.8*  PHOS  --  7.5*  --    CBC: Recent Labs  Lab 12/02/20 1746 12/03/20 0201 12/05/20 0618 12/06/20 0103  WBC 6.0 7.9 6.4 9.8  NEUTROABS 4.0  --   --   --   HGB 10.6* 8.1* 7.2* 7.8*  HCT 35.0* 26.5* 22.9* 24.9*  MCV 120.7* 119.9* 115.7* 115.3*  PLT 135* 281 254 328   Blood Culture    Component Value Date/Time   SDES BLOOD RIGHT ARM 10/10/2016 0124   SPECREQUEST IN PEDIATRIC BOTTLE Blood Culture adequate volume 10/10/2016 0124   CULT NO GROWTH 5 DAYS 10/10/2016 0124   REPTSTATUS 10/15/2016 FINAL 10/10/2016 0124    Cardiac Enzymes: Recent Labs  Lab 12/03/20 0201  CKTOTAL 47   Medications:  . aspirin EC  81 mg Oral Daily  . Chlorhexidine Gluconate Cloth  6 each Topical Q0600  . darbepoetin (ARANESP) injection - DIALYSIS  60 mcg Intravenous Q Mon-HD  . isosorbide mononitrate  60 mg Oral BID  . sevelamer carbonate  1,600 mg Oral TID WC    Outpatient Dialysis Orders: Center:[facility]  MWF. EDW61kgHD Bath 2K/2.5CaTime 3.5 hrsHeparin none. AccessLUE AVFBFR Nipro Cellentia 17h Blood [id number]  Hectoral 6mcg IV/HD  Epogen 8000Units IV/HDgiven 12/02/20  Assessment/Plan:   1. ESRD - Tolerated HD on 5/27 with net UF 1.8L  -  continue MWF schedule 2. Hypertension/volume-h/o noncomplaince with medications and has come in w/ hypertensive urgency. ResumedHD and meds, BP still moderately elevated but improved with UF.  3. Anemia-Hgb 7.8, no bleeding reported. Started aranesp with HD on 12/07/20. (previously on epogen) 4. Metabolic bone disease-Calcium elevated, not on VDRA. Will use low Ca bath. Checking albumin level and PTH with labs tomorrow. Phos level 7.5. on renvela 2 tabs TIDM, possibly high due to med noncompliance, follow trend.  5. Weakness-neurology on board; suspecting statin but she has also been on for many years. Recent admission for weakness which improved with discontinuation of gabapentin + RRT. She feels the weakness is much better. Neuro recommends cont holding statin, outpatient EMG and neuropsych eval. 6. Nutrition-renal diet 7. H/o myoclonus - Do not resume gabapentin as likely source of myoclonus and leg weakness-> improved after gabapentin was stopped last admission + RRT. 8. DM type 2 - per primary 9. Vascular access -  have had issues with avf inpast but working well here so far.  10. Dispo: Awaiting SNF    Anice Paganini, PA-C 12/08/2020, 10:47 AM  [facility] Pager: [phone]

## 2020-12-09 LAB — CBC WITH DIFFERENTIAL/PLATELET
Abs Immature Granulocytes: 0.04 10*3/uL (ref 0.00–0.07)
Basophils Absolute: 0.2 10*3/uL — ABNORMAL HIGH (ref 0.0–0.1)
Basophils Relative: 2 %
Eosinophils Absolute: 0.7 10*3/uL — ABNORMAL HIGH (ref 0.0–0.5)
Eosinophils Relative: 7 %
HCT: 24.7 % — ABNORMAL LOW (ref 36.0–46.0)
Hemoglobin: 7.4 g/dL — ABNORMAL LOW (ref 12.0–15.0)
Immature Granulocytes: 0 %
Lymphocytes Relative: 10 %
Lymphs Abs: 1 10*3/uL (ref 0.7–4.0)
MCH: 34.6 pg — ABNORMAL HIGH (ref 26.0–34.0)
MCHC: 30 g/dL (ref 30.0–36.0)
MCV: 115.4 fL — ABNORMAL HIGH (ref 80.0–100.0)
Monocytes Absolute: 1 10*3/uL (ref 0.1–1.0)
Monocytes Relative: 10 %
Neutro Abs: 6.8 10*3/uL (ref 1.7–7.7)
Neutrophils Relative %: 71 %
Platelets: 355 10*3/uL (ref 150–400)
RBC: 2.14 MIL/uL — ABNORMAL LOW (ref 3.87–5.11)
RDW: 17 % — ABNORMAL HIGH (ref 11.5–15.5)
WBC: 9.6 10*3/uL (ref 4.0–10.5)
nRBC: 0 % (ref 0.0–0.2)

## 2020-12-09 LAB — COMPREHENSIVE METABOLIC PANEL
ALT: 25 U/L (ref 0–44)
AST: 29 U/L (ref 15–41)
Albumin: 2.6 g/dL — ABNORMAL LOW (ref 3.5–5.0)
Alkaline Phosphatase: 44 U/L (ref 38–126)
Anion gap: 9 (ref 5–15)
BUN: 35 mg/dL — ABNORMAL HIGH (ref 8–23)
CO2: 30 mmol/L (ref 22–32)
Calcium: 12.3 mg/dL — ABNORMAL HIGH (ref 8.9–10.3)
Chloride: 100 mmol/L (ref 98–111)
Creatinine, Ser: 7.99 mg/dL — ABNORMAL HIGH (ref 0.44–1.00)
GFR, Estimated: 5 mL/min — ABNORMAL LOW (ref >60)
Glucose, Bld: 138 mg/dL — ABNORMAL HIGH (ref 70–99)
Potassium: 4.8 mmol/L (ref 3.5–5.1)
Sodium: 139 mmol/L (ref 135–145)
Total Bilirubin: 0.6 mg/dL (ref 0.3–1.2)
Total Protein: 5.8 g/dL — ABNORMAL LOW (ref 6.5–8.1)

## 2020-12-09 LAB — URIC ACID: Uric Acid, Serum: 1.9 mg/dL — ABNORMAL LOW (ref 2.5–7.1)

## 2020-12-09 LAB — HEPATITIS B E ANTIBODY: Hep B E Ab: NEGATIVE

## 2020-12-09 NOTE — Progress Notes (Addendum)
PROGRESS NOTE    Shawna [name]  VEL:[id number] DOB: March 04, 1941 DOA: 12/02/2020 PCP: [facility]  No chief complaint on file.  Brief Narrative:  BRIZA BARK is [name] 80 y.o. female with Shawna [name] history of paroxysmal atrial fibrillation, end-stage renal disease on HD, chronic diastolic heart failure, hypertension, hyperlipidemia.  Patient presented secondary to unsteadiness on feet and weakness with concern for myositis on admission.  Neurology was consulted on admission.  Initial work-up appears to be negative for myositis.   Assessment & Plan:   Principal Problem:   Myositis Active Problems:   ESRD on hemodialysis (HCC)   HLD (hyperlipidemia)   Chronic diastolic CHF (congestive heart failure) (HCC)   Essential hypertension   Paroxysmal atrial fibrillation (HCC)  Leg weakness Initial concern for myositis. Initial workup appears unremarkable for this diagnosis with normal CK, CRP, mildly elevated ESR and negative femur MRI. MRI brain negative for acute stroke. ANA negative. PT recommending SNF -Neurology recommendations: outpatient EMG, neuropsych evaluation; can consider stopping amiodarone if persisting -recommended monitoring off statin and outpatient EMG  Confusion Unknown etiology.  Vitamin b12 wnl, TSH elevated, folate, normal free T3/4. TSH thought to be elevated in setting of amiodarone use.  Possibly related to Domique Clapper concussion.  Patient does not make urine.  MRI brain with age related cerebral atrophy with advanced chronic microvascular ischemic disease, few scattered remote lacunar infarcts involving L basal ganglia and cerebellum Delirium precautions  Will need to discuss baseline with family   Paroxysmal atrial fibrillation Currently in sinus rhythm. Discussed with cardiology about amiodarone who recommended stopping amiodarone in setting of elevated TSH. Patient is not on anticoagulation in setting of recurrent acute GI bleeding.  ESRD on HD Last HD session on  5/25. Nephrology consulted for inpatient HD while admitted.  Syncope? RN reported syncope in setting of hypotension on dialysis, improved after bolus Follow - will monitor for 24 hrs.  Primary hypertension -Continue Imdur  Chronic diastolic heart failure Stable.  Chronic anemia Likely secondary to ESRD. Baseline hemoglobin of around 8. Hemoglobin down to 7.2 and stable.  Left ankle pain X-ray negative for fracture. -ACE bandage for comfort - uric acid   DVT prophylaxis: SCD Code Status: full  Family Communication: none at bedside Disposition:   Status is: Inpatient  Remains inpatient appropriate because:Inpatient level of care appropriate due to severity of illness   Dispo: The patient is from: Home              Anticipated d/c is to: SNF              Patient currently is not medically stable to d/c.   Difficult to place patient No       Consultants:   Neurology  neprhology  Procedures:   none  Antimicrobials: Anti-infectives (From admission, onward)   None         Subjective: No new complaints, pleasantly confused  Objective: Vitals:   12/09/20 1128 12/09/20 1154 12/09/20 1204 12/09/20 1249  BP: (!) 151/52 (!) 138/46 (!) 150/57 (!) 152/45  Pulse: 78 75 73 78  Resp:   18 17  Temp:   97.8 F (36.6 C) 98 F (36.7 C)  TempSrc:   Oral Oral  SpO2:   95% 92%  Weight:   54.1 kg     Intake/Output Summary (Last 24 hours) at 12/09/2020 1832 Last data filed at 12/09/2020 1338 Gross per 24 hour  Intake 410 ml  Output -140 ml  Net 550 ml  Filed Weights   12/08/20 0420 12/09/20 0832 12/09/20 1204  Weight: 53.8 kg 54 kg 54.1 kg    Examination:  General exam: Appears calm and comfortable  Respiratory system: Clear to auscultation. Respiratory effort normal. Cardiovascular system: S1 & S2 heard, RRR.  Gastrointestinal system: Abdomen is nondistended, soft and nontender. Central nervous system: pleasantly confused, moving all extremities -  able to lift legs off bed, 4+/5 strength bilaterally Extremities: no LEE Skin: No rashes, lesions or ulcers  Data Reviewed: I have personally reviewed following labs and imaging studies  CBC: Recent Labs  Lab 12/03/20 0201 12/05/20 0618 12/06/20 0103 12/09/20 0808  WBC 7.9 6.4 9.8 9.6  NEUTROABS  --   --   --  6.8  HGB 8.1* 7.2* 7.8* 7.4*  HCT 26.5* 22.9* 24.9* 24.7*  MCV 119.9* 115.7* 115.3* 115.4*  PLT 281 254 328 623    Basic Metabolic Panel: Recent Labs  Lab 12/03/20 0201 12/04/20 0837 12/06/20 0103 12/09/20 0808  NA 136  --  134* 139  K 3.8  --  4.4 4.8  CL 97*  --  96* 100  CO2 29  --  26 30  GLUCOSE 140*  --  111* 138*  BUN 12  --  24* 35*  CREATININE 4.81*  --  6.46* 7.99*  CALCIUM 10.0  --  10.8* 12.3*  MG 1.9  --   --   --   PHOS  --  7.5*  --   --     GFR: Estimated Creatinine Clearance: 4.2 mL/min (Theora Vankirk) (by C-G formula based on SCr of 7.99 mg/dL (H)).  Liver Function Tests: Recent Labs  Lab 12/09/20 0808  AST 29  ALT 25  ALKPHOS 44  BILITOT 0.6  PROT 5.8*  ALBUMIN 2.6*    CBG: No results for input(s): GLUCAP in the last 168 hours.   Recent Results (from the past 240 hour(s))  SARS CORONAVIRUS 2 (TAT 6-24 HRS) Nasopharyngeal Nasopharyngeal Swab     Status: None   Collection Time: 12/02/20 11:18 PM   Specimen: Nasopharyngeal Swab  Result Value Ref Range Status   SARS Coronavirus 2 NEGATIVE NEGATIVE Final    Comment: (NOTE) SARS-CoV-2 target nucleic acids are NOT DETECTED.  The SARS-CoV-2 RNA is generally detectable in upper and lower respiratory specimens during the acute phase of infection. Negative results do not preclude SARS-CoV-2 infection, do not rule out co-infections with other pathogens, and should not be used as the sole basis for treatment or other patient management decisions. Negative results must be combined with clinical observations, patient history, and epidemiological information. The expected result is  Negative.  Fact Sheet for Patients: SugarRoll.be  Fact Sheet for Healthcare Providers: https://www.woods-mathews.com/  This test is not yet approved or cleared by the [place] FDA and  has been authorized for detection and/or diagnosis of SARS-CoV-2 by FDA under an Emergency Use Authorization (EUA). This EUA will remain  in effect (meaning this test can be used) for the duration of the COVID-19 declaration under Se ction 564(b)(1) of the Act, 21 U.S.C. section 360bbb-3(b)(1), unless the authorization is terminated or revoked sooner.  Performed at [facility], [place] [name], [place], [name] [address]          Radiology Studies: No results found.      Scheduled Meds: . aspirin EC  81 mg Oral Daily  . Chlorhexidine Gluconate Cloth  6 each Topical Q0600  . Chlorhexidine Gluconate Cloth  6 each Topical Q0600  .  darbepoetin (ARANESP) injection - DIALYSIS  60 mcg Intravenous Q Mon-HD  . isosorbide mononitrate  60 mg Oral BID  . sevelamer carbonate  1,600 mg Oral TID WC   Continuous Infusions:   LOS: 7 days    Time spent: over 30 min    Fayrene Helper, MD [facility]   To contact the attending provider between 7A-7P or the covering provider during after hours 7P-7A, please log into the web site [email or web address] and access using universal [place] password for that web site. If you do not have the password, please call the hospital operator.  12/09/2020, 6:32 PM

## 2020-12-09 NOTE — Progress Notes (Signed)
[facility] Progress Note   Subjective:   Pt seen on HD. No events noted overnight. Denies SOB, CP, palpitations, dizziness, and nausea.   Objective Vitals:   12/08/20 0420 12/08/20 2049 12/09/20 0432 12/09/20 0832  BP: (!) 168/50 (!) 155/43 (!) 160/51 (!) 182/61  Pulse: 83 80 81 79  Resp: 18 18 18 16   Temp: 98.7 F (37.1 C) 99 F (37.2 C) 98.9 F (37.2 C) 98.3 F (36.8 C)  TempSrc: Oral Oral Oral Oral  SpO2: 94% 93% 94% 91%  Weight: 53.8 kg      Physical Exam General:WDWN female in NAD Heart:RRR, no murmurs Lungs:CTA bilaterally without wheezing, rhonchi or rales Abdomen:Soft, non-distended, +BS Extremities:No edema b/l lower extremities Dialysis Access:LUE AVF + t/b  Additional Objective Labs: Basic Metabolic Panel: Recent Labs  Lab 12/03/20 0201 12/04/20 0837 12/06/20 0103  NA 136  --  134*  K 3.8  --  4.4  CL 97*  --  96*  CO2 29  --  26  GLUCOSE 140*  --  111*  BUN 12  --  24*  CREATININE 4.81*  --  6.46*  CALCIUM 10.0  --  10.8*  PHOS  --  7.5*  --    CBC: Recent Labs  Lab 12/02/20 1746 12/03/20 0201 12/05/20 0618 12/06/20 0103 12/09/20 0808  WBC 6.0 7.9 6.4 9.8 9.6  NEUTROABS 4.0  --   --   --  6.8  HGB 10.6* 8.1* 7.2* 7.8* 7.4*  HCT 35.0* 26.5* 22.9* 24.9* 24.7*  MCV 120.7* 119.9* 115.7* 115.3* 115.4*  PLT 135* 281 254 328 355   Blood Culture    Component Value Date/Time   SDES BLOOD RIGHT ARM 10/10/2016 0124   SPECREQUEST IN PEDIATRIC BOTTLE Blood Culture adequate volume 10/10/2016 0124   CULT NO GROWTH 5 DAYS 10/10/2016 0124   REPTSTATUS 10/15/2016 FINAL 10/10/2016 0124    Cardiac Enzymes: Recent Labs  Lab 12/03/20 0201  CKTOTAL 47   Medications:  . aspirin EC  81 mg Oral Daily  . Chlorhexidine Gluconate Cloth  6 each Topical Q0600  . Chlorhexidine Gluconate Cloth  6 each Topical Q0600  . darbepoetin (ARANESP) injection - DIALYSIS  60 mcg Intravenous Q Mon-HD  . isosorbide mononitrate  60 mg Oral BID   . sevelamer carbonate  1,600 mg Oral TID WC    OutpatientDialysis Orders: [facility] MWF. EDW61kgHD Bath 2K/2.5CaTime 3.5 hrsHeparin none. AccessLUE AVFBFR Nipro Cellentia 17h Blood [id number]  Hectoral 95mcg IV/HD  Epogen 8000Units IV/HDgiven 12/02/20  Assessment/Plan: 1. ESRD - Tolerating HD well -  continue MWF schedule 2. Hypertension/volume-h/ononcomplaincewith medications and came in w/ hypertensive urgency. ResumedHD and meds, BP still moderately elevated but improved with UF.  3. Anemia-Hgb 7.4, no bleeding reported. Started aranesp with HD on 12/07/20. (previously on epogen) 4. Metabolic bone disease-Calcium elevated, not on VDRA. Will use low Ca bath. Checking albumin level and PTH with labs. Phos level 7.5. on renvela 2 tabs TIDM, possibly high due to med noncompliance, follow trend. 5. Weakness-neurology on board; suspecting statin but she has also been on for many years. Recent admission for weakness which improved with discontinuation of gabapentin + RRT. She feels the weakness is much better. Neuro recommendscont holding statin, outpatient EMG and neuropsych eval. 6. Nutrition-renal diet 7. H/o myoclonus - Do not resume gabapentin as likely source of myoclonus and leg weakness-> improved after gabapentin was stopped last admission + RRT. 8. DM type 2 - per primary 9. Vascular access - have had  issues with avf inpastbut working well here so far. 10. Dispo: Awaiting SNF   Anice Paganini, PA-C 12/09/2020, 8:37 AM  [facility] Pager: [phone]

## 2020-12-09 NOTE — TOC Progression Note (Signed)
Transition of Care [facility]) - Progression Note    Patient Details  Name: Shawna [name] MRN: [id number] Date of Birth: 20-Oct-1940  Transition of [facility]) CM/SW [place], [place] Phone Number: 12/09/2020, 12:16 PM  Clinical Narrative:    [facility] received insurance approval for patient. Per MD, patient not medically ready to discharge today but perhaps tomorrow. CSW updated [facility] and patient's spouse. He stated he is hoping to take her to SNF by car.   Expected Discharge Plan: Skilled Nursing Facility Barriers to Discharge: Continued Medical Work up  Expected Discharge Plan and Services Expected Discharge Plan: [place] In-house Referral: Clinical Social Work   Post Acute Care Choice: [place] Living arrangements for the past 2 months: Single Family Home                                       Social Determinants of Health (SDOH) Interventions    Readmission Risk Interventions Readmission Risk Prevention Plan 11/11/2020 10/04/2020 09/02/2020  Transportation Screening Complete Complete Complete  PCP or Specialist Appt within 3-5 Days - - -  [place] or [place] Work Consult for [place] - - -  Medication Review Press photographer) Complete Complete Complete  PCP or Specialist appointment within 3-5 days of discharge Complete Complete -  [place] or [place] Complete Complete Complete  SW Recovery Care/Counseling Consult Complete Complete Complete  Palliative Care Screening Not Applicable Not Applicable Not [place] Not Applicable Complete Patient Refused  Some recent data might be hidden

## 2020-12-10 LAB — COMPREHENSIVE METABOLIC PANEL
ALT: 25 U/L (ref 0–44)
AST: 30 U/L (ref 15–41)
Albumin: 2.9 g/dL — ABNORMAL LOW (ref 3.5–5.0)
Alkaline Phosphatase: 49 U/L (ref 38–126)
Anion gap: 7 (ref 5–15)
BUN: 11 mg/dL (ref 8–23)
CO2: 33 mmol/L — ABNORMAL HIGH (ref 22–32)
Calcium: 11.3 mg/dL — ABNORMAL HIGH (ref 8.9–10.3)
Chloride: 97 mmol/L — ABNORMAL LOW (ref 98–111)
Creatinine, Ser: 4.04 mg/dL — ABNORMAL HIGH (ref 0.44–1.00)
GFR, Estimated: 11 mL/min — ABNORMAL LOW (ref >60)
Glucose, Bld: 117 mg/dL — ABNORMAL HIGH (ref 70–99)
Potassium: 3.7 mmol/L (ref 3.5–5.1)
Sodium: 137 mmol/L (ref 135–145)
Total Bilirubin: 0.7 mg/dL (ref 0.3–1.2)
Total Protein: 6.6 g/dL (ref 6.5–8.1)

## 2020-12-10 LAB — CBC WITH DIFFERENTIAL/PLATELET
Abs Immature Granulocytes: 0.05 10*3/uL (ref 0.00–0.07)
Basophils Absolute: 0.1 10*3/uL (ref 0.0–0.1)
Basophils Relative: 2 %
Eosinophils Absolute: 0.6 10*3/uL — ABNORMAL HIGH (ref 0.0–0.5)
Eosinophils Relative: 7 %
HCT: 24 % — ABNORMAL LOW (ref 36.0–46.0)
Hemoglobin: 7.3 g/dL — ABNORMAL LOW (ref 12.0–15.0)
Immature Granulocytes: 1 %
Lymphocytes Relative: 11 %
Lymphs Abs: 1 10*3/uL (ref 0.7–4.0)
MCH: 35.3 pg — ABNORMAL HIGH (ref 26.0–34.0)
MCHC: 30.4 g/dL (ref 30.0–36.0)
MCV: 115.9 fL — ABNORMAL HIGH (ref 80.0–100.0)
Monocytes Absolute: 0.9 10*3/uL (ref 0.1–1.0)
Monocytes Relative: 9 %
Neutro Abs: 6.7 10*3/uL (ref 1.7–7.7)
Neutrophils Relative %: 70 %
Platelets: 376 10*3/uL (ref 150–400)
RBC: 2.07 MIL/uL — ABNORMAL LOW (ref 3.87–5.11)
RDW: 17.4 % — ABNORMAL HIGH (ref 11.5–15.5)
WBC: 9.4 10*3/uL (ref 4.0–10.5)
nRBC: 0 % (ref 0.0–0.2)

## 2020-12-10 LAB — PHOSPHORUS: Phosphorus: 3.8 mg/dL (ref 2.5–4.6)

## 2020-12-10 LAB — PARATHYROID HORMONE, INTACT (NO CA): PTH: 69 pg/mL — ABNORMAL HIGH (ref 15–65)

## 2020-12-10 LAB — MAGNESIUM: Magnesium: 2 mg/dL (ref 1.7–2.4)

## 2020-12-10 MED ORDER — SODIUM CHLORIDE 0.9 % IV SOLN
60.0000 mg | Freq: Once | INTRAVENOUS | Status: AC
  Administered 2020-12-10: 60 mg via INTRAVENOUS
  Filled 2020-12-10 (×2): qty 20

## 2020-12-10 MED ORDER — ORAL CARE MOUTH RINSE
15.0000 mL | Freq: Two times a day (BID) | OROMUCOSAL | Status: DC
  Administered 2020-12-10 – 2020-12-17 (×10): 15 mL via OROMUCOSAL

## 2020-12-10 MED ORDER — CHLORHEXIDINE GLUCONATE CLOTH 2 % EX PADS
6.0000 | MEDICATED_PAD | Freq: Every day | CUTANEOUS | Status: DC
  Administered 2020-12-11 – 2020-12-17 (×7): 6 via TOPICAL

## 2020-12-10 NOTE — Progress Notes (Signed)
Physical Therapy Treatment Patient Details Name: Shawna [name] MRN: [id number] DOB: Apr 15, 1941 Today's Date: 12/10/2020    History of Present Illness Pt is an 80 y/o female admitted 5/25 secondary to inability to walk and BLE weakness (LLE>RLE). Thought to be secondary to myopathy per neuro notes. PMH includes ESRD on HD, HTN, a fib, CAD, dCHF.    PT Comments    Pt was able to progress OOB today to recliner chair with mod A. She remains confused and disoriented to all but self. She was unable to remember that she was in the hospital despite being told several times, and required assist to call her husband from her cell phone at end of session. Mobility appears to be improving however pt will still require SNF level therapies at d/c. Will continue to follow acutely.    Follow Up Recommendations  SNF     Equipment Recommendations  Wheelchair (measurements PT);Wheelchair cushion (measurements PT)    Recommendations for Other Services       Precautions / Restrictions Precautions Precautions: Fall Precaution Comments: Has had multiple falls at home.    Mobility  Bed Mobility Overal bed mobility: Needs Assistance Bed Mobility: Supine to Sit     Supine to sit: Min assist     General bed mobility comments: min A, more to keep pt on task than for real physical need.    Transfers Overall transfer level: Needs assistance Equipment used: Rolling walker (2 wheeled);1 person hand held assist Transfers: Sit to/from [facility] Sit to Stand: Min assist Stand pivot transfers: Mod assist       General transfer comment: sit>stand 1x with HHA 1x with RW to allow for peri care. Pt unable to pick up LLE to transfer from bed to recliner, so required mod A for face to face transfer.  Ambulation/Gait                 Stairs             Wheelchair Mobility    Modified Rankin (Stroke Patients Only)       Balance Overall balance assessment: Needs  assistance Sitting-balance support: Feet supported;No upper extremity supported Sitting balance-Leahy Scale: Fair Sitting balance - Comments: Pt sat EOB without support.   Standing balance support: Bilateral upper extremity supported;During functional activity Standing balance-Leahy Scale: Poor Standing balance comment: Reliant on UE and external support                            Cognition Arousal/Alertness: Awake/alert Behavior During Therapy: WFL for tasks assessed/performed Overall Cognitive Status: Impaired/Different from baseline Area of Impairment: Orientation;Attention;Memory;Following commands;Safety/judgement;Problem solving                 Orientation Level: Disoriented to;Place;Time;Situation Current Attention Level: Sustained Memory: Decreased recall of precautions;Decreased short-term memory Following Commands: Follows one step commands inconsistently;Follows one step commands with increased time Safety/Judgement: Decreased awareness of safety;Decreased awareness of deficits   Problem Solving: Slow processing;Decreased initiation;Difficulty sequencing;Requires verbal cues;Requires tactile cues General Comments: Pt easily distracted requiring frequent redirection to remain on task. Very fixated on people walking by her room, so curtin needed to be closed. Repeatedly told she was at [facility], but would forget where she was a few min later.      Exercises      General Comments General comments (skin integrity, edema, etc.): required assist to call husband at end of session.  Pertinent Vitals/Pain      Home Living                      Prior Function            PT Goals (current goals can now be found in the care plan section) Acute Rehab PT Goals Patient Stated Goal: to go home PT Goal Formulation: With patient Time For Goal Achievement: 12/17/20 Potential to Achieve Goals: Fair Progress towards PT goals: Progressing  toward goals    Frequency    Min 2X/week      PT Plan Current plan remains appropriate    Co-evaluation              AM-PAC PT "6 Clicks" Mobility   Outcome Measure  Help needed turning from your back to your side while in a flat bed without using bedrails?: A Little Help needed moving from lying on your back to sitting on the side of a flat bed without using bedrails?: A Little Help needed moving to and from a bed to a chair (including a wheelchair)?: A Lot Help needed standing up from a chair using your arms (e.g., wheelchair or bedside chair)?: A Little Help needed to walk in hospital room?: Total Help needed climbing 3-5 steps with a railing? : Total 6 Click Score: 13    End of Session Equipment Utilized During Treatment: Gait belt Activity Tolerance: Patient tolerated treatment well Patient left: with call bell/phone within reach;in chair;with chair alarm set Nurse Communication: Mobility status PT Visit Diagnosis: Unsteadiness on feet (R26.81);Muscle weakness (generalized) (M62.81)     Time: 8101-7510 PT Time Calculation (min) (ACUTE ONLY): 38 min  Charges:  $Therapeutic Activity: 23-37 mins                     Shawna [name], [place] Pager [phone] Acute Rehab   Shawna [name] 12/10/2020, 3:42 PM

## 2020-12-10 NOTE — Progress Notes (Signed)
MEDICATION RELATED CONSULT NOTE - FOLLOW UP   Pharmacy Consult for Pamidronate Indication: Hypercalcemia in setting of ESRD  Allergies  Allergen Reactions  . Aspirin Other (See Comments)    High Doses Mess up her stomach; "makes my bowels have blood in them". Takes 81 mg EC Aspirin   . Penicillins Other (See Comments)    SYNCOPE? , "makes me real weak when I take it; like I'll pass out"  Has patient had a PCN reaction causing immediate rash, facial/tongue/throat swelling, SOB or lightheadedness with hypotension: Yes Has patient had a PCN reaction causing severe rash involving mucus membranes or skin necrosis: no Has patient had a PCN reaction that required hospitalization no Has patient had a PCN reaction occurring within the last 10 years: no If all of the above  . Amlodipine Swelling  . Bactrim [Sulfamethoxazole-Trimethoprim] Rash  . Contrast Media [Iodinated Diagnostic Agents] Itching  . Iron Itching and Other (See Comments)    "they gave me iron in dialysis; had to give me Benadryl cause I had to have the iron" (05/02/2012)  . Nitrofurantoin Hives  . Tylenol [Acetaminophen] Itching and Other (See Comments)    Makes her feet on fire per pt  . Gabapentin Other (See Comments)    Unknown reaction  . Hydralazine Itching    Has tolerated while inpatient  . Levofloxacin   . No Known Allergies   . Ranexa [Ranolazine] Other (See Comments)    Myoclonus-hospitalized   . Dexilant [Dexlansoprazole] Other (See Comments)    Upset stomach  . Levaquin [Levofloxacin In D5w] Rash  . Morphine And Related Itching and Other (See Comments)    Itching in feet  . Plavix [Clopidogrel Bisulfate] Rash  . Protonix [Pantoprazole Sodium] Rash  . Venofer [Ferric Oxide] Itching and Other (See Comments)    Patient reports using Benadryl prior to doses as [facility]    Patient Measurements: Weight: 55.1 kg (121 lb 7.6 oz)   Vital Signs: Temp: 98.5 F (36.9 C) (06/02 0508) Temp Source:  Axillary (06/02 0508) BP: 151/58 (06/02 0508) Pulse Rate: 80 (06/02 0508) Intake/Output from previous day: 06/01 0701 - 06/02 0700 In: 270 [P.O.:270] Out: -140  Intake/Output from this shift: No intake/output data recorded.  Labs: Recent Labs    12/09/20 0808 12/10/20 0046  WBC 9.6 9.4  HGB 7.4* 7.3*  HCT 24.7* 24.0*  PLT 355 376  CREATININE 7.99* 4.04*  MG  --  2.0  PHOS  --  3.8  ALBUMIN 2.6* 2.9*  PROT 5.8* 6.6  AST 29 30  ALT 25 25  ALKPHOS 44 49  BILITOT 0.6 0.7   Estimated Creatinine Clearance: 8.4 mL/min (A) (by C-G formula based on SCr of 4.04 mg/dL (H)).   Microbiology: Recent Results (from the past 720 hour(s))  SARS CORONAVIRUS 2 (TAT 6-24 HRS) Nasopharyngeal Nasopharyngeal Swab     Status: None   Collection Time: 12/02/20 11:18 PM   Specimen: Nasopharyngeal Swab  Result Value Ref Range Status   SARS Coronavirus 2 NEGATIVE NEGATIVE Final    Comment: (NOTE) SARS-CoV-2 target nucleic acids are NOT DETECTED.  The SARS-CoV-2 RNA is generally detectable in upper and lower respiratory specimens during the acute phase of infection. Negative results do not preclude SARS-CoV-2 infection, do not rule out co-infections with other pathogens, and should not be used as the sole basis for treatment or other patient management decisions. Negative results must be combined with clinical observations, patient history, and epidemiological information. The expected result is Negative.  Fact Sheet for Patients: SugarRoll.be  Fact Sheet for Healthcare Providers: https://www.woods-mathews.com/  This test is not yet approved or cleared by the [place] FDA and  has been authorized for detection and/or diagnosis of SARS-CoV-2 by FDA under an Emergency Use Authorization (EUA). This EUA will remain  in effect (meaning this test can be used) for the duration of the COVID-19 declaration under Se ction 564(b)(1) of the Act, 21  U.S.C. section 360bbb-3(b)(1), unless the authorization is terminated or revoked sooner.  Performed at [facility], [place] [address], [place], [place] [address]     Medications:  Medications Prior to Admission  Medication Sig Dispense Refill Last Dose  . amiodarone (PACERONE) 200 MG tablet Take 1 tablet (200 mg total) by mouth daily. Starting July 2 90 tablet 3 12/02/2020 at Unknown time  . aspirin EC 81 MG tablet Take 81 mg by mouth daily.    12/02/2020 at Unknown time  . cyclobenzaprine (FLEXERIL) 5 MG tablet Take 5 mg by mouth 3 (three) times daily as needed for muscle spasms.   Past Week at Unknown time  . fluticasone (FLONASE) 50 MCG/ACT nasal spray Place 1 spray into both nostrils at bedtime as needed for allergies.    Past Month at Unknown time  . hydrOXYzine (VISTARIL) 100 MG capsule Take 100 mg by mouth 2 (two) times daily as needed for anxiety or itching.   12/01/2020 at Unknown time  . isosorbide mononitrate (IMDUR) 60 MG 24 hr tablet Take 1 tablet (60 mg total) by mouth in the morning and at bedtime. 180 tablet 3 12/02/2020 at Unknown time  . lidocaine-prilocaine (EMLA) cream Apply 1 application topically every Monday, Wednesday, and Friday. Prior to dialysis   12/02/2020 at Unknown time  . loperamide (IMODIUM A-D) 2 MG tablet Take 1 tablet (2 mg total) by mouth daily before breakfast. (Patient taking differently: Take 4 mg by mouth daily as needed for diarrhea or loose stools.) 30 tablet 0 Past Week at Unknown time  . loratadine (CLARITIN) 10 MG tablet Take 1 tablet (10 mg total) by mouth daily as needed for allergies. 30 tablet 0 unk  . multivitamin (RENA-VIT) TABS tablet Take 1 tablet by mouth daily.   12/02/2020 at Unknown time  . nitroGLYCERIN (NITROSTAT) 0.4 MG SL tablet Place 1 tablet (0.4 mg total) under the tongue every 5 (five) minutes x 3 doses as needed for chest pain (if no relief after 2nd dose, proceed to the ED for an evaluation or call 911). 25 tablet 2 Past Week at  Unknown time  . omeprazole (PRILOSEC) 20 MG capsule Take 1 capsule (20 mg total) by mouth daily. 90 capsule 3 12/02/2020 at Unknown time  . sevelamer carbonate (RENVELA) 800 MG tablet Take 2 tablets (1,600 mg total) by mouth 3 (three) times daily with meals. 180 tablet 1 12/02/2020 at Unknown time  . simvastatin (ZOCOR) 20 MG tablet Take 1 tablet (20 mg total) by mouth at bedtime. 90 tablet 3 12/01/2020 at Unknown time  . triamcinolone (KENALOG) 0.025 % ointment Apply 1 application topically 2 (two) times daily as needed (itching).   12/01/2020 at Unknown time  . triamcinolone cream (KENALOG) 0.1 % Apply 1 application topically 2 (two) times daily as needed (itching).   12/01/2020 at Unknown time    Assessment: 80 yo ESRD patient with hypercalcemia (corrected calcium of 12.2) despite other interventions. Consulted by Nephrology to dose pamidronate.  Per literature, 60mg  pamidronate should be safe to give in this setting.  Goal of Therapy:  Decreased calcium  Plan:  Pamidronate 60mg  IV x 1 Monitor calcium levels  Keisuke Hollabaugh A. Levada Dy, PharmD, BCPS, FNKF Clinical Pharmacist [place] Please utilize Amion for appropriate phone number to reach the unit pharmacist ([place])   12/10/2020,10:39 AM

## 2020-12-10 NOTE — Progress Notes (Signed)
[facility] Progress Note   Subjective:   Reportedly had an episode of relative hypotension (no significant BP drop recorded) and possible syncope on HD yesterday which resolved quickly with small fluid bolus. BP now moderately elevated. Pt sleeping but awakens briefly to voice, reports feeling well.   Objective Vitals:   12/09/20 1204 12/09/20 1249 12/09/20 2019 12/10/20 0508  BP: (!) 150/57 (!) 152/45 (!) 177/35 (!) 151/58  Pulse: 73 78 73 80  Resp: 18 17 18 18   Temp: 97.8 F (36.6 C) 98 F (36.7 C) 98.5 F (36.9 C) 98.5 F (36.9 C)  TempSrc: Oral Oral Oral Axillary  SpO2: 95% 92% 96% 99%  Weight: 54.1 kg   55.1 kg   Physical Exam General:WDWN elderly female in NAD Heart:RRR, no murmurs Lungs:CTA bilaterally without wheezing, rhonchi or rales Abdomen:Soft, non-distended, +BS Extremities:No edema b/l lower extremities Dialysis Access:LUE AVF + t/b  Additional Objective Labs: Basic Metabolic Panel: Recent Labs  Lab 12/04/20 0837 12/06/20 0103 12/09/20 0808 12/10/20 0046  NA  --  134* 139 137  K  --  4.4 4.8 3.7  CL  --  96* 100 97*  CO2  --  26 30 33*  GLUCOSE  --  111* 138* 117*  BUN  --  24* 35* 11  CREATININE  --  6.46* 7.99* 4.04*  CALCIUM  --  10.8* 12.3* 11.3*  PHOS 7.5*  --   --  3.8   Liver Function Tests: Recent Labs  Lab 12/09/20 0808 12/10/20 0046  AST 29 30  ALT 25 25  ALKPHOS 44 49  BILITOT 0.6 0.7  PROT 5.8* 6.6  ALBUMIN 2.6* 2.9*   No results for input(s): LIPASE, AMYLASE in the last 168 hours. CBC: Recent Labs  Lab 12/05/20 0618 12/06/20 0103 12/09/20 0808 12/10/20 0046  WBC 6.4 9.8 9.6 9.4  NEUTROABS  --   --  6.8 6.7  HGB 7.2* 7.8* 7.4* 7.3*  HCT 22.9* 24.9* 24.7* 24.0*  MCV 115.7* 115.3* 115.4* 115.9*  PLT 254 328 355 376   Blood Culture    Component Value Date/Time   SDES BLOOD RIGHT ARM 10/10/2016 0124   SPECREQUEST IN PEDIATRIC BOTTLE Blood Culture adequate volume 10/10/2016 0124   CULT NO  GROWTH 5 DAYS 10/10/2016 0124   REPTSTATUS 10/15/2016 FINAL 10/10/2016 0124   Medications:  . aspirin EC  81 mg Oral Daily  . Chlorhexidine Gluconate Cloth  6 each Topical Q0600  . Chlorhexidine Gluconate Cloth  6 each Topical Q0600  . darbepoetin (ARANESP) injection - DIALYSIS  60 mcg Intravenous Q Mon-HD  . isosorbide mononitrate  60 mg Oral BID  . mouth rinse  15 mL Mouth Rinse BID  . sevelamer carbonate  1,600 mg Oral TID WC    [facility]: [facility] EDW61kgHD Bath 2K/2.5CaTime 3.5 hrsHeparin none. AccessLUE AVFBFR Nipro Cellentia 17h Blood [id number]  Hectoral 36mcg IV/HD  Epogen 8000Units IV/HDgiven 12/02/20  Assessment/Plan: 1. ESRD - continue MWF schedule 2. Hypertension/volume-h/ononcomplaincewith medications and came in w/ hypertensive urgency. BP still moderately elevated but reportedly had possible syncope on HD yesterday. She is now below her previous outpatient EDW with no volume excess on exam. Back on imdur. Many med intolerances including amlodipine and hydralazine.  Also did not tolerate ranexa and had hypotension with beta blocker per cardiology notes. Could consider trial of very low dose beta blocker if BP remains elevated.  3. Anemia-Hgb 7.3, no bleeding reported.Started aranesp with HD on 12/07/20.(previously on epogen) 4. Metabolic bone  disease-Corrected calcium is now up to 12.2 despite low calcium bath and holding VDRA. Unclear cause-suspect immobility most likely.PTH level pending. Hypercalcemia may be contributing to her confusion, will give a dose of pamidronate (requesting pharmacy assistance with dosing). Phos now at goal after resuming binders.  5. Weakness-neurology on board; suspecting statin but she has also been on for many years. Recent admission for weakness which improved with discontinuation of gabapentin + RRT. She feels the weakness is much better. Neuro recommendscont  holding statin, outpatient EMG and neuropsych eval. 6. Nutrition-renal diet 7. H/o myoclonus - Do not resume gabapentin as likely source of myoclonus and leg weakness-> improved after gabapentin was stopped last admission + RRT. 8. DM type 2 - per primary 9. Vascular access - have had issues with avf inpastbut working well here so far. 10. Dispo: Awaiting SNF   Anice Paganini, PA-C 12/10/2020, 10:02 AM  [facility] Pager: [phone]

## 2020-12-10 NOTE — Progress Notes (Signed)
PROGRESS NOTE    Shawna [name]  BCW:[id number] DOB: 02/21/1941 DOA: 12/02/2020 PCP: [facility]  No chief complaint on file.  Brief Narrative:  Shawna [name] is Shawna [name] 80 y.o. female with Shawna [name] history of paroxysmal atrial fibrillation, end-stage renal disease on HD, chronic diastolic heart failure, hypertension, hyperlipidemia.  Patient presented secondary to unsteadiness on feet and weakness with concern for myositis on admission.  Neurology was consulted on admission.  Initial work-up appears to be negative for myositis.   Assessment & Plan:   Principal Problem:   Myositis Active Problems:   ESRD on hemodialysis (HCC)   HLD (hyperlipidemia)   Chronic diastolic CHF (congestive heart failure) (HCC)   Essential hypertension   Paroxysmal atrial fibrillation (HCC)  Leg weakness Initial concern for myositis. Initial workup appears unremarkable for this diagnosis with normal CK, CRP, mildly elevated ESR and negative femur MRI. MRI brain negative for acute stroke. ANA negative. PT recommending SNF -Neurology recommendations: outpatient EMG, neuropsych evaluation; can consider stopping amiodarone if persisting -recommended monitoring off statin and outpatient EMG  Confusion  Acute Metabolic Encephalopathy  Concern for Cognitive Deficit Unknown etiology.  Vitamin b12 wnl, TSH elevated, folate, normal free T3/4. TSH thought to be elevated in setting of amiodarone use.  Hypercalcemia, will give Shawna [name] dose of bisphosphonate per renal  Possibly related to Shawna [name] concussion? Patient does not make urine.  MRI brain with age related cerebral atrophy with advanced chronic microvascular ischemic disease, few scattered remote lacunar infarcts involving L basal ganglia and cerebellum Delirium precautions  Husband thinks she's doing Shawna [name] little better today from mental status standpoint, but still not at baseline (sounded pretty good today he noted).  Says she was walking with cane Kelan Pritt couple of weeks ago.    Needs formal neuropsych testing outpatient   Hypercalcemia Elevated PTH.  Corrected Calcium 12.2. Per renal, using low calcium bath and holding VDRA Given dose of pamidronate per renal, follow   Paroxysmal atrial fibrillation Currently in sinus rhythm. Discussed with cardiology about amiodarone who recommended stopping amiodarone in setting of elevated TSH. Patient is not on anticoagulation in setting of recurrent acute GI bleeding.  ESRD on HD Last HD session on 5/25. Nephrology consulted for inpatient HD while admitted.  Syncope? RN reported syncope in setting of hypotension on dialysis, improved after bolus Resolved, continue to monitor  Primary hypertension -Continue Imdur  Chronic diastolic heart failure Stable.  Chronic anemia Likely secondary to ESRD. Iron, ferritin, b12, folate tomorrow, follow  Left ankle pain X-ray negative for fracture. -ACE bandage for comfort - uric acid 1.9    DVT prophylaxis: SCD Code Status: full  Family Communication: none at bedside - husband over phone 6/1 and 2 Disposition:   Status is: Inpatient  Remains inpatient appropriate because:Inpatient level of care appropriate due to severity of illness   Dispo: The patient is from: Home              Anticipated d/c is to: SNF              Patient currently is not medically stable to d/c.   Difficult to place patient No       Consultants:   Neurology  neprhology  Procedures:   none  Antimicrobials: Anti-infectives (From admission, onward)   None         Subjective: No complaints Pleasantly confused  Objective: Vitals:   12/09/20 1249 12/09/20 2019 12/10/20 0508 12/10/20 1410  BP: (!) 152/45 (!) 177/35 (!) 151/58 (!) 121/40  Pulse: 78 73 80 73  Resp: _0 Temp: 98 F (36.7 C) 98.5 F (36.9 C) 98.5 F (36.9 C) 98.4 F (36.9 C)  TempSrc: Oral Oral Axillary Oral  SpO2: 92% 96% 99% 95%  Weight:   55.1 kg     Intake/Output Summary (Last 24  hours) at 12/10/2020 1639 Last data filed at 12/10/2020 1251 Gross per 24 hour  Intake 55 ml  Output --  Net 55 ml   Filed Weights   12/09/20 0832 12/09/20 1204 12/10/20 0508  Weight: 54 kg 54.1 kg 55.1 kg    Examination:  General: No acute distress. Cardiovascular: Heart sounds show Shawna [name] regular rate, and rhythm.  Lungs: Clear to auscultation bilaterally  Abdomen: Soft, nontender, nondistended Neurological: Alert and oriented 1 - pleasantly confused. Moves all extremities 4 . Cranial nerves II through XII grossly intact. Extremities: No clubbing or cyanosis. No edema.     Data Reviewed: I have personally reviewed following labs and imaging studies  CBC: Recent Labs  Lab 12/05/20 0618 12/06/20 0103 12/09/20 0808 12/10/20 0046  WBC 6.4 9.8 9.6 9.4  NEUTROABS  --   --  6.8 6.7  HGB 7.2* 7.8* 7.4* 7.3*  HCT 22.9* 24.9* 24.7* 24.0*  MCV 115.7* 115.3* 115.4* 115.9*  PLT 254 328 355 165    Basic Metabolic Panel: Recent Labs  Lab 12/04/20 0837 12/06/20 0103 12/09/20 0808 12/10/20 0046  NA  --  134* 139 137  K  --  4.4 4.8 3.7  CL  --  96* 100 97*  CO2  --  26 30 33*  GLUCOSE  --  111* 138* 117*  BUN  --  24* 35* 11  CREATININE  --  6.46* 7.99* 4.04*  CALCIUM  --  10.8* 12.3* 11.3*  MG  --   --   --  2.0  PHOS 7.5*  --   --  3.8    GFR: Estimated Creatinine Clearance: 8.4 mL/min (Shawna [name]) (by C-G formula based on SCr of 4.04 mg/dL (H)).  Liver Function Tests: Recent Labs  Lab 12/09/20 0808 12/10/20 0046  AST 29 30  ALT 25 25  ALKPHOS 44 49  BILITOT 0.6 0.7  PROT 5.8* 6.6  ALBUMIN 2.6* 2.9*    CBG: No results for input(s): GLUCAP in the last 168 hours.   Recent Results (from the past 240 hour(s))  SARS CORONAVIRUS 2 (TAT 6-24 HRS) Nasopharyngeal Nasopharyngeal Swab     Status: None   Collection Time: 12/02/20 11:18 PM   Specimen: Nasopharyngeal Swab  Result Value Ref Range Status   SARS Coronavirus 2 NEGATIVE NEGATIVE Final    Comment:  (NOTE) SARS-CoV-2 target nucleic acids are NOT DETECTED.  The SARS-CoV-2 RNA is generally detectable in upper and lower respiratory specimens during the acute phase of infection. Negative results do not preclude SARS-CoV-2 infection, do not rule out co-infections with other pathogens, and should not be used as the sole basis for treatment or other patient management decisions. Negative results must be combined with clinical observations, patient history, and epidemiological information. The expected result is Negative.  Fact Sheet for Patients: SugarRoll.be  Fact Sheet for Healthcare Providers: https://www.woods-mathews.com/  This test is not yet approved or cleared by the [place] FDA and  has been authorized for detection and/or diagnosis of SARS-CoV-2 by FDA under an Emergency Use Authorization (EUA). This EUA will remain  in effect (meaning this test can be used) for the duration of the COVID-19 declaration under Se  ction 564(b)(1) of the Act, 21 U.S.C. section 360bbb-3(b)(1), unless the authorization is terminated or revoked sooner.  Performed at [facility], [place] [address], [place], [place] [address]          Radiology Studies: No results found.      Scheduled Meds: . aspirin EC  81 mg Oral Daily  . Chlorhexidine Gluconate Cloth  6 each Topical Q0600  . darbepoetin (ARANESP) injection - DIALYSIS  60 mcg Intravenous Q Mon-HD  . isosorbide mononitrate  60 mg Oral BID  . mouth rinse  15 mL Mouth Rinse BID  . sevelamer carbonate  1,600 mg Oral TID WC   Continuous Infusions: . pamidronate 60 mg (12/10/20 1608)     LOS: 8 days    Time spent: over 30 min    Fayrene Helper, MD [facility]   To contact the attending provider between 7A-7P or the covering provider during after hours 7P-7A, please log into the web site [email or web address] and access using universal [place] password for that web site.  If you do not have the password, please call the hospital operator.  12/10/2020, 4:39 PM

## 2020-12-11 LAB — CBC WITH DIFFERENTIAL/PLATELET
Abs Immature Granulocytes: 0.02 10*3/uL (ref 0.00–0.07)
Basophils Absolute: 0.1 10*3/uL (ref 0.0–0.1)
Basophils Relative: 2 %
Eosinophils Absolute: 0.8 10*3/uL — ABNORMAL HIGH (ref 0.0–0.5)
Eosinophils Relative: 11 %
HCT: 20.1 % — ABNORMAL LOW (ref 36.0–46.0)
Hemoglobin: 6.2 g/dL — CL (ref 12.0–15.0)
Immature Granulocytes: 0 %
Lymphocytes Relative: 12 %
Lymphs Abs: 0.9 10*3/uL (ref 0.7–4.0)
MCH: 35.8 pg — ABNORMAL HIGH (ref 26.0–34.0)
MCHC: 30.8 g/dL (ref 30.0–36.0)
MCV: 116.2 fL — ABNORMAL HIGH (ref 80.0–100.0)
Monocytes Absolute: 0.6 10*3/uL (ref 0.1–1.0)
Monocytes Relative: 8 %
Neutro Abs: 5.1 10*3/uL (ref 1.7–7.7)
Neutrophils Relative %: 67 %
Platelets: 307 10*3/uL (ref 150–400)
RBC: 1.73 MIL/uL — ABNORMAL LOW (ref 3.87–5.11)
RDW: 17.2 % — ABNORMAL HIGH (ref 11.5–15.5)
WBC: 7.5 10*3/uL (ref 4.0–10.5)
nRBC: 0 % (ref 0.0–0.2)

## 2020-12-11 LAB — VITAMIN B12: Vitamin B-12: 793 pg/mL (ref 180–914)

## 2020-12-11 LAB — COMPREHENSIVE METABOLIC PANEL
ALT: 20 U/L (ref 0–44)
AST: 21 U/L (ref 15–41)
Albumin: 2.4 g/dL — ABNORMAL LOW (ref 3.5–5.0)
Alkaline Phosphatase: 40 U/L (ref 38–126)
Anion gap: 10 (ref 5–15)
BUN: 24 mg/dL — ABNORMAL HIGH (ref 8–23)
CO2: 28 mmol/L (ref 22–32)
Calcium: 11.1 mg/dL — ABNORMAL HIGH (ref 8.9–10.3)
Chloride: 101 mmol/L (ref 98–111)
Creatinine, Ser: 6.91 mg/dL — ABNORMAL HIGH (ref 0.44–1.00)
GFR, Estimated: 6 mL/min — ABNORMAL LOW (ref >60)
Glucose, Bld: 86 mg/dL (ref 70–99)
Potassium: 4.3 mmol/L (ref 3.5–5.1)
Sodium: 139 mmol/L (ref 135–145)
Total Bilirubin: 0.6 mg/dL (ref 0.3–1.2)
Total Protein: 5.2 g/dL — ABNORMAL LOW (ref 6.5–8.1)

## 2020-12-11 LAB — MAGNESIUM: Magnesium: 2 mg/dL (ref 1.7–2.4)

## 2020-12-11 LAB — IRON AND TIBC
Iron: 35 ug/dL (ref 28–170)
Saturation Ratios: 20 % (ref 10.4–31.8)
TIBC: 178 ug/dL — ABNORMAL LOW (ref 250–450)
UIBC: 143 ug/dL

## 2020-12-11 LAB — PREPARE RBC (CROSSMATCH)

## 2020-12-11 LAB — PHOSPHORUS: Phosphorus: 5.1 mg/dL — ABNORMAL HIGH (ref 2.5–4.6)

## 2020-12-11 LAB — FOLATE: Folate: 22.5 ng/mL (ref >5.9)

## 2020-12-11 LAB — FERRITIN: Ferritin: 729 ng/mL — ABNORMAL HIGH (ref 11–307)

## 2020-12-11 LAB — AMMONIA: Ammonia: 27 umol/L (ref 9–35)

## 2020-12-11 MED ORDER — SODIUM CHLORIDE 0.9% IV SOLUTION: Freq: Once | INTRAVENOUS | Status: DC

## 2020-12-11 NOTE — Progress Notes (Signed)
HOSPITAL MEDICINE OVERNIGHT EVENT NOTE    The patient's hemoglobin is 6.2 this morning.  Chart reviewed, patient has a history of GI bleeding in the past.  Hemoglobin seems to have gradually down trended from 10.6 nine days ago down to 6.2 today making this finding unlikely to be erroneous.  Per my discussion with nursing, patient exhibits no clinical evidence of gross bleeding.  Patient does not appear to be having any chest pain or be in respiratory distress.  Patient is hemodynamically stable.  Type and screen ordered.  1 unit packed red blood cell transfusion ordered to be given with dialysis today.   Vernelle Emerald  MD [facility]

## 2020-12-11 NOTE — Progress Notes (Signed)
[facility] Progress Note   Subjective:    Seen in HD unit. Hgb 6.2 this am -getting transfused on HD. Denies overt bleeding, melena. No cp, sob.    Objective Vitals:   12/10/20 1410 12/10/20 2034 12/11/20 0450 12/11/20 0500  BP: (!) 121/40 (!) 93/39 (!) 96/34   Pulse: 73 73 72   Resp: 19 20 18    Temp: 98.4 F (36.9 C) 98.2 F (36.8 C) 98.5 F (36.9 C)   TempSrc: Oral Oral Oral   SpO2: 95% 100% 100%   Weight:    57.6 kg   Physical Exam General:WDWN elderly female in NAD Heart:RRR, no murmurs Lungs:CTA bilaterally without wheezing, rhonchi or rales Abdomen:Soft, non-distended, +BS Extremities:No edema b/l lower extremities Dialysis Access:LUE AVF + t/b  Additional Objective Labs: Basic Metabolic Panel: Recent Labs  Lab 12/04/20 0837 12/06/20 0103 12/09/20 0808 12/10/20 0046 12/11/20 0427  NA  --    < > 139 137 139  K  --    < > 4.8 3.7 4.3  CL  --    < > 100 97* 101  CO2  --    < > 30 33* 28  GLUCOSE  --    < > 138* 117* 86  BUN  --    < > 35* 11 24*  CREATININE  --    < > 7.99* 4.04* 6.91*  CALCIUM  --    < > 12.3* 11.3* 11.1*  PHOS 7.5*  --   --  3.8 5.1*   < > = values in this interval not displayed.   Liver Function Tests: Recent Labs  Lab 12/09/20 0808 12/10/20 0046 12/11/20 0427  AST 29 30 21   ALT 25 25 20   ALKPHOS 44 49 40  BILITOT 0.6 0.7 0.6  PROT 5.8* 6.6 5.2*  ALBUMIN 2.6* 2.9* 2.4*   No results for input(s): LIPASE, AMYLASE in the last 168 hours. CBC: Recent Labs  Lab 12/05/20 0618 12/06/20 0103 12/09/20 0808 12/10/20 0046 12/11/20 0427  WBC 6.4 9.8 9.6 9.4 7.5  NEUTROABS  --   --  6.8 6.7 5.1  HGB 7.2* 7.8* 7.4* 7.3* 6.2*  HCT 22.9* 24.9* 24.7* 24.0* 20.1*  MCV 115.7* 115.3* 115.4* 115.9* 116.2*  PLT 254 328 355 376 307   Blood Culture    Component Value Date/Time   SDES BLOOD RIGHT ARM 10/10/2016 0124   SPECREQUEST IN PEDIATRIC BOTTLE Blood Culture adequate volume 10/10/2016 0124   CULT NO GROWTH  5 DAYS 10/10/2016 0124   REPTSTATUS 10/15/2016 FINAL 10/10/2016 0124   Medications:  . sodium chloride   Intravenous Once  . aspirin EC  81 mg Oral Daily  . Chlorhexidine Gluconate Cloth  6 each Topical Q0600  . darbepoetin (ARANESP) injection - DIALYSIS  60 mcg Intravenous Q Mon-HD  . isosorbide mononitrate  60 mg Oral BID  . mouth rinse  15 mL Mouth Rinse BID  . sevelamer carbonate  1,600 mg Oral TID WC    [facility]ialysis [facility]: [facility] EDW61kgHD Bath 2K/2.5CaTime 3.5 hrsHeparin none. AccessLUE AVFBFR Nipro Cellentia 17h Blood flow300DFR500  Hectoral 40mcg IV/HD  Epogen 8000Units IV/HDgiven 12/02/20  Assessment/Plan: 1. ESRD- continue MWF schedule. HD today.  2. Hypertension/volume-h/ononcomplaincewith medications and came in w/ hypertensive urgency. BP still moderately elevated but reportedly had possible syncope on HD Wed.  She is now below her previous outpatient EDW with no volume excess on exam. Back on imdur. Many med intolerances including amlodipine and hydralazine.  Also did not tolerate  ranexa and had hypotension with beta blocker per cardiology notes. Could consider trial of very low dose beta blocker if BP remains elevated.  3. Anemia-Hgb 6.2 this am. For transfusion today. no bleeding reported.Started aranesp with HD on 12/07/20.(previously on epogen). Tsat 20%. Replete Fe  4. Metabolic bone disease-Corrected calcium elevated despite low calcium bath and holding VDRA. Unclear cause-suspect immobility most likely.PTH 69. Hypercalcemia may be contributing to her confusion. S/p pamidronate 60mg  on 6/2. Follow labs.  5. Weakness-neurology on board; suspecting statin but she has also been on for many years. Recent admission for weakness which improved with discontinuation of gabapentin + RRT. She feels the weakness is much better. Neuro recommendscont holding statin, outpatient EMG and neuropsych  eval. 6. Nutrition-renal diet 7. H/o myoclonus - Do not resume gabapentin as likely source of myoclonus and leg weakness-> improved after gabapentin was stopped last admission + RRT. 8. DM type 2 - per primary 9. Vascular access - have had issues with avf inpastbut working well here so far. 10. Dispo: Awaiting SNF   Perry Molla Larina Earthly PA-C [facility] 12/11/2020,8:31 AM

## 2020-12-11 NOTE — Care Management Important Message (Signed)
Important Message  Patient Details  Name: Shawna [name] MRN: [id number] Date of Birth: 17-Sep-1940   Medicare Important Message Given:  Yes - Important Message mailed due to current National Emergency   Verbal consent obtained due to current National Emergency  Relationship to patient: Self Contact Name: Cloria Call Date: 12/11/20  Time: 1250 Phone: [phone] Outcome: Spoke with contact Important Message mailed to: Patient address on file    [name] 12/11/2020, 12:50 PM

## 2020-12-11 NOTE — Progress Notes (Signed)
HGB 6.2. MD paged.

## 2020-12-11 NOTE — Progress Notes (Signed)
PROGRESS NOTE        PATIENT DETAILS Name: Shawna [name] Age: 80 y.o. Sex: female Date of Birth: 27-Nov-1940 Admit Date: 12/02/2020 Admitting Physician Rhetta Mura, DO XNT:[id number], [facility]  Brief Narrative: Patient is a 80 y.o. female ESRD on HD, PAF, chronic diastolic heart failure, HTN, HLD who presented with worsening lower extremity weakness-with concerns for myositis on admission.  She underwent neurology eval-work-up was negative for myositis.  Hospital course complicated by acute metabolic encephalopathy, hypercalcemia and worsening anemia.  See below for further details.  Significant studies: 5/25>> CT head: No acute intracranial pathology. 5/25>> MRI brain: No acute intracranial abnormality. 5/26>> MRI left femur: No acute abnormality 5/26>> CXR: Resolution of pulm edema, left basilar scarring. 5/26>> SPEP: No M spike 5/26>> TSH: 9.19 5/26>> CK: 47 5/28>> x-ray left ankle: No fracture/dislocation  Antimicrobial therapy: None  Microbiology data: 5/25>> COVID PCR: Negative  Procedures : None  Consults: Neurology, nephrology  DVT Prophylaxis : SCDs Start: 12/02/20 2349   Subjective: Seen in HD-appears weak/lethargic-hemoglobin 6.2 this morning-awaiting transfusion.   Assessment/Plan: Bilateral lower extremity weakness: Neuroimaging as above-evaluated by neurology with recommendations for outpatient EMG/neuropsych evaluation.  CK within normal limits.  Needs SNF on discharge.  Able to lift both lower extremities off the bed against mild resistance.  Acute metabolic encephalopathy: MRI brain negative-suspect this is probably mild metabolic encephalopathy in the setting of hypercalcemia.  Seems somewhat better today-although no family at bedside-but answering simple questions appropriately.  Hypercalcemia: S/p pamidronate on 6/2-nephrology following and directing care.  PAF: Maintaining sinus rhythm-prior MD discussed with  cardiology-amiodarone was discontinued in the setting of mildly elevated TSH.  Not a candidate for anticoagulation due to recurrent GI bleeding.  ESRD on HD: Nephrology following and directing inpatient HD care.  Syncope: Occurred on 6/1-in the setting of hypotension during HD.  Continue close monitoring.  HTN: BP reasonable-continue Imdur  Chronic diastolic heart failure: Volume management with HD.  Normocytic anemia: Due to ESRD-worsened by acute illness-no GI bleeding apparent.  Being transfused PRBC today.  Repeat CBC in AM.  Left ankle pain: Imaging negative-supportive care continues.   Diet: Diet Order            Diet renal with fluid restriction Fluid restriction: 1800 mL Fluid; Room service appropriate? No; Fluid consistency: Thin  Diet effective now                  Code Status: Full code   Family Communication: Spouse-Nathaniel-[phone]-called on 6/3-unable to leave voicemail.  Disposition Plan: Status is: Inpatient  Remains inpatient appropriate because:Inpatient level of care appropriate due to severity of illness   Dispo: The patient is from: Home              Anticipated d/c is to: SNF              Patient currently is not medically stable to d/c.   Difficult to place patient No     Barriers to Discharge: Resolving encephalopathy-worsening anemia-being transfused 1 unit of PRBC with hemodialysis today.  Antimicrobial agents: Anti-infectives (From admission, onward)   None       Time spent: 35 minutes-Greater than 50% of this time was spent in counseling, explanation of diagnosis, planning of further management, and coordination of care.  MEDICATIONS: Scheduled Meds: . sodium chloride   Intravenous Once  .  aspirin EC  81 mg Oral Daily  . Chlorhexidine Gluconate Cloth  6 each Topical Q0600  . darbepoetin (ARANESP) injection - DIALYSIS  60 mcg Intravenous Q Mon-HD  . isosorbide mononitrate  60 mg Oral BID  . mouth rinse  15 mL Mouth Rinse  BID  . sevelamer carbonate  1,600 mg Oral TID WC   Continuous Infusions: PRN Meds:.hydrALAZINE, hydrOXYzine   PHYSICAL EXAM: Vital signs: Vitals:   12/11/20 0900 12/11/20 0930 12/11/20 1000 12/11/20 1030  BP: (!) 156/56 (!) 176/47 (!) 153/45 (!) 135/55  Pulse: 72 75 74 71  Resp:      Temp:      TempSrc:      SpO2:      Weight:       Filed Weights   12/10/20 0508 12/11/20 0500 12/11/20 0810  Weight: 55.1 kg 57.6 kg 53.5 kg   Body mass index is 22.29 kg/m.   Gen Exam:Alert awake-frail appearing-not in any distress. HEENT:atraumatic, normocephalic Chest: B/L clear to auscultation anteriorly CVS:S1S2 regular Abdomen:soft non tender, non distended Extremities:no edema Neurology: Generalized weakness but nonfocal Skin: no rash  I have personally reviewed following labs and imaging studies  LABORATORY DATA: CBC: Recent Labs  Lab 12/05/20 0618 12/06/20 0103 12/09/20 0808 12/10/20 0046 12/11/20 0427  WBC 6.4 9.8 9.6 9.4 7.5  NEUTROABS  --   --  6.8 6.7 5.1  HGB 7.2* 7.8* 7.4* 7.3* 6.2*  HCT 22.9* 24.9* 24.7* 24.0* 20.1*  MCV 115.7* 115.3* 115.4* 115.9* 116.2*  PLT 254 328 355 376 735    Basic Metabolic Panel: Recent Labs  Lab 12/06/20 0103 12/09/20 0808 12/10/20 0046 12/11/20 0427  NA 134* 139 137 139  K 4.4 4.8 3.7 4.3  CL 96* 100 97* 101  CO2 26 30 33* 28  GLUCOSE 111* 138* 117* 86  BUN 24* 35* 11 24*  CREATININE 6.46* 7.99* 4.04* 6.91*  CALCIUM 10.8* 12.3* 11.3* 11.1*  MG  --   --  2.0 2.0  PHOS  --   --  3.8 5.1*    GFR: Estimated Creatinine Clearance: 4.9 mL/min (A) (by C-G formula based on SCr of 6.91 mg/dL (H)).  Liver Function Tests: Recent Labs  Lab 12/09/20 0808 12/10/20 0046 12/11/20 0427  AST 29 30 21   ALT 25 25 20   ALKPHOS 44 49 40  BILITOT 0.6 0.7 0.6  PROT 5.8* 6.6 5.2*  ALBUMIN 2.6* 2.9* 2.4*   No results for input(s): LIPASE, AMYLASE in the last 168 hours. Recent Labs  Lab 12/11/20 0427  AMMONIA 27    Coagulation  Profile: No results for input(s): INR, PROTIME in the last 168 hours.  Cardiac Enzymes: No results for input(s): CKTOTAL, CKMB, CKMBINDEX, TROPONINI in the last 168 hours.  BNP (last 3 results) No results for input(s): PROBNP in the last 8760 hours.  Lipid Profile: No results for input(s): CHOL, HDL, LDLCALC, TRIG, CHOLHDL, LDLDIRECT in the last 72 hours.  Thyroid Function Tests: No results for input(s): TSH, T4TOTAL, FREET4, T3FREE, THYROIDAB in the last 72 hours.  Anemia Panel: Recent Labs    12/11/20 0427  VITAMINB12 793  FOLATE 22.5  FERRITIN 729*  TIBC 178*  IRON 35    Urine analysis:    Component Value Date/Time   COLORURINE YELLOW 12/16/2012 1919   APPEARANCEUR CLOUDY (A) 12/16/2012 1919   LABSPEC 1.009 12/16/2012 1919   PHURINE 7.5 12/16/2012 1919   GLUCOSEU NEGATIVE 12/16/2012 1919   HGBUR TRACE (A) 12/16/2012 1919   BILIRUBINUR NEGATIVE  12/16/2012 [place] 12/16/2012 1919   PROTEINUR 100 (A) 12/16/2012 1919   UROBILINOGEN 0.2 12/16/2012 1919   NITRITE NEGATIVE 12/16/2012 1919   LEUKOCYTESUR SMALL (A) 12/16/2012 1919    Sepsis Labs: Lactic Acid, Venous    Component Value Date/Time   LATICACIDVEN 1.84 03/22/2017 0615    MICROBIOLOGY: Recent Results (from the past 240 hour(s))  SARS CORONAVIRUS 2 (TAT 6-24 HRS) Nasopharyngeal Nasopharyngeal Swab     Status: None   Collection Time: 12/02/20 11:18 PM   Specimen: Nasopharyngeal Swab  Result Value Ref Range Status   SARS Coronavirus 2 NEGATIVE NEGATIVE Final    Comment: (NOTE) SARS-CoV-2 target nucleic acids are NOT DETECTED.  The SARS-CoV-2 RNA is generally detectable in upper and lower respiratory specimens during the acute phase of infection. Negative results do not preclude SARS-CoV-2 infection, do not rule out co-infections with other pathogens, and should not be used as the sole basis for treatment or other patient management decisions. Negative results must be combined with  clinical observations, patient history, and epidemiological information. The expected result is Negative.  Fact Sheet for Patients: SugarRoll.be  Fact Sheet for Healthcare Providers: https://www.woods-mathews.com/  This test is not yet approved or cleared by the [place] FDA and  has been authorized for detection and/or diagnosis of SARS-CoV-2 by FDA under an Emergency Use Authorization (EUA). This EUA will remain  in effect (meaning this test can be used) for the duration of the COVID-19 declaration under Se ction 564(b)(1) of the Act, 21 U.S.C. section 360bbb-3(b)(1), unless the authorization is terminated or revoked sooner.  Performed at [facility], [place] [address] [place], [place] [address]     RADIOLOGY STUDIES/RESULTS: No results found.   LOS: 9 days   Oren Binet, MD  [facility]    To contact the attending provider between 7A-7P or the covering provider during after hours 7P-7A, please log into the web site [email or web address] and access using universal [place] password for that web site. If you do not have the password, please call the hospital operator.  12/11/2020, 10:58 AM

## 2020-12-12 LAB — HEMOGLOBIN AND HEMATOCRIT, BLOOD
HCT: 27.3 % — ABNORMAL LOW (ref 36.0–46.0)
Hemoglobin: 8.6 g/dL — ABNORMAL LOW (ref 12.0–15.0)

## 2020-12-12 MED ORDER — ISOSORBIDE MONONITRATE ER 30 MG PO TB24: 30.0000 mg | ORAL_TABLET | Freq: Two times a day (BID) | ORAL | Status: DC

## 2020-12-12 MED ORDER — BENZONATATE 100 MG PO CAPS
100.0000 mg | ORAL_CAPSULE | Freq: Three times a day (TID) | ORAL | Status: DC | PRN
  Administered 2020-12-12 – 2020-12-17 (×5): 100 mg via ORAL
  Filled 2020-12-12 (×6): qty 1

## 2020-12-12 MED ORDER — ISOSORBIDE MONONITRATE ER 30 MG PO TB24
30.0000 mg | ORAL_TABLET | Freq: Every day | ORAL | Status: DC
  Administered 2020-12-12 – 2020-12-17 (×4): 30 mg via ORAL
  Filled 2020-12-12 (×4): qty 1

## 2020-12-12 NOTE — TOC Progression Note (Signed)
Transition of Care [facility]) - Progression Note    Patient Details  Name: Shawna [name] MRN: [id number] Date of Birth: 12-Nov-1940  Transition of Care [facility]) CM/SW [place], [place] Phone Number: 12/12/2020, 12:40 PM  Clinical Narrative:     CSW called and LVM for Bryson Ha with [facility] to check on insurance authorization for patient. CSW awaiting callback. Patient has SNF bed at [facility]. CSW will continue to follow and assist with discharge planning needs.   Expected Discharge Plan: Skilled Nursing Facility Barriers to Discharge: Continued Medical Work up  Expected Discharge Plan and Services Expected Discharge Plan: [place] In-house Referral: Clinical Social Work   Post Acute Care Choice: [place] Living arrangements for the past 2 months: Single Family Home                                       Social Determinants of Health (SDOH) Interventions    Readmission Risk Interventions Readmission Risk Prevention Plan 11/11/2020 10/04/2020 09/02/2020  Transportation Screening Complete Complete Complete  PCP or Specialist Appt within 3-5 Days - - -  [place]RI or [place] Work Consult for [place] - - -  Medication Review Press photographer) Complete Complete Complete  PCP or Specialist appointment within 3-5 days of discharge Complete Complete -  [place] or [place] Complete Complete Complete  SW Recovery Care/Counseling Consult Complete Complete Complete  Palliative Care Screening Not Applicable Not Applicable Not [place] Not Applicable Complete Patient Refused  Some recent data might be hidden

## 2020-12-12 NOTE — Progress Notes (Addendum)
PROGRESS NOTE    Shawna [name]  MRN:[id number]  DOB: 03/18/1941  PCP: [facility] Admit date:12/02/2020 Chief compliant:  80 y.o. female ESRD on HD, PAF, chronic diastolic heart failure, HTN, HLD who presented with worsening lower extremity weakness-with concerns for myositis on admission.  She underwent neurology eval-work-up was negative for myositis.   Hospital course:  Hospital stay complicated by acute metabolic encephalopathy, hypercalcemia and worsening anemia.  CT head, MRI brain ruled out stroke on admission.  X-ray ankle and MRI left femur with no acute fractures.  CK 47. COVID PCR negative on admission.  Seen by neurology/nephrology.  Subjective:  Patient resting comfortably, no acute distress.  Appears to mumble with tangential speech at times.  She does report dry cough and asking medication to help.  Objective: Vitals:   12/11/20 2109 12/12/20 0412 12/12/20 1454 12/12/20 2015  BP: (!) 139/42 (!) 156/51 (!) 126/36 94/67  Pulse: 77 78 68   Resp: 19 18 16 17  Temp: 99.3 F (37.4 C) 97.8 F (36.6 C)  97.9 F (36.6 C)  TempSrc: Oral Oral  Oral  SpO2:  100% 94%   Weight:       No intake or output data in the 24 hours ending 12/12/20 2027 Filed Weights   12/11/20 0810 12/11/20 1150 12/11/20 1229  Weight: 53.5 kg 52 kg 54.4 kg    Physical Examination:  General: Moderately built, no acute distress noted. Head ENT: Atraumatic normocephalic, PERRLA, neck supple Heart: S1-S2 heard, regular rate and rhythm, no murmurs.  No leg edema noted Lungs: Equal air entry bilaterally, no rhonchi or rales on exam, no accessory muscle use Abdomen: Bowel sounds heard, soft, nontender, nondistended. No organomegaly.  No CVA tenderness Extremities: No pedal edema.  No cyanosis or clubbing.  Left upper extremity AV fistula with thrill. Neurological: Awake alert oriented x2, mumbled/tangential speech, able to move all extremities Skin: No wounds or rashes.    Data  Reviewed: I have personally reviewed following labs and imaging studies  CBC: Recent Labs  Lab 12/06/20 0103 12/09/20 0808 12/10/20 0046 12/11/20 0427 12/12/20 1036  WBC 9.8 9.6 9.4 7.5  --   NEUTROABS  --  6.8 6.7 5.1  --   HGB 7.8* 7.4* 7.3* 6.2* 8.6*  HCT 24.9* 24.7* 24.0* 20.1* 27.3*  MCV 115.3* 115.4* 115.9* 116.2*  --   PLT 328 355 376 307  --    Basic Metabolic Panel: Recent Labs  Lab 12/06/20 0103 12/09/20 0808 12/10/20 0046 12/11/20 0427  NA 134* 139 137 139  K 4.4 4.8 3.7 4.3  CL 96* 100 97* 101  CO2 26 30 33* 28  GLUCOSE 111* 138* 117* 86  BUN 24* 35* 11 24*  CREATININE 6.46* 7.99* 4.04* 6.91*  CALCIUM 10.8* 12.3* 11.3* 11.1*  MG  --   --  2.0 2.0  PHOS  --   --  3.8 5.1*   GFR: Estimated Creatinine Clearance: 4.9 mL/min (A) (by C-G formula based on SCr of 6.91 mg/dL (H)). Liver Function Tests: Recent Labs  Lab 12/09/20 0808 12/10/20 0046 12/11/20 0427  AST 29 30 21  ALT 25 25 20  ALKPHOS 44 49 40  BILITOT 0.6 0.7 0.6  PROT 5.8* 6.6 5.2*  ALBUMIN 2.6* 2.9* 2.4*   No results for input(s): LIPASE, AMYLASE in the last 168 hours. Recent Labs  Lab 12/11/20 0427  AMMONIA 27   Coagulation Profile: No results for input(s): INR, PROTIME in the last 168 hours. Cardiac Enzymes: No   results for input(s): CKTOTAL, CKMB, CKMBINDEX, TROPONINI in the last 168 hours. BNP (last 3 results) No results for input(s): PROBNP in the last 8760 hours. HbA1C: No results for input(s): HGBA1C in the last 72 hours. CBG: No results for input(s): GLUCAP in the last 168 hours. Lipid Profile: No results for input(s): CHOL, HDL, LDLCALC, TRIG, CHOLHDL, LDLDIRECT in the last 72 hours. Thyroid Function Tests: No results for input(s): TSH, T4TOTAL, FREET4, T3FREE, THYROIDAB in the last 72 hours. Anemia Panel: Recent Labs    12/11/20 0427  VITAMINB12 793  FOLATE 22.5  FERRITIN 729*  TIBC 178*  IRON 35   Sepsis Labs: No results for input(s): PROCALCITON,  LATICACIDVEN in the last 168 hours.  Recent Results (from the past 240 hour(s))  SARS CORONAVIRUS 2 (TAT 6-24 HRS) Nasopharyngeal Nasopharyngeal Swab     Status: None   Collection Time: 12/02/20 11:18 PM   Specimen: Nasopharyngeal Swab  Result Value Ref Range Status   SARS Coronavirus 2 NEGATIVE NEGATIVE Final    Comment: (NOTE) SARS-CoV-2 target nucleic acids are NOT DETECTED.  The SARS-CoV-2 RNA is generally detectable in upper and lower respiratory specimens during the acute phase of infection. Negative results do not preclude SARS-CoV-2 infection, do not rule out co-infections with other pathogens, and should not be used as the sole basis for treatment or other patient management decisions. Negative results must be combined with clinical observations, patient history, and epidemiological information. The expected result is Negative.  Fact Sheet for Patients: SugarRoll.be  Fact Sheet for Healthcare Providers: https://www.woods-mathews.com/  This test is not yet approved or cleared by the [place] FDA and  has been authorized for detection and/or diagnosis of SARS-CoV-2 by FDA under an Emergency Use Authorization (EUA). This EUA will remain  in effect (meaning this test can be used) for the duration of the COVID-19 declaration under Se ction 564(b)(1) of the Act, 21 U.S.C. section 360bbb-3(b)(1), unless the authorization is terminated or revoked sooner.  Performed at [facility], [place] [address] [name], [place] [address]       Radiology Studies: No results found.    Scheduled Meds: . sodium chloride   Intravenous Once  . aspirin EC  81 mg Oral Daily  . Chlorhexidine Gluconate Cloth  6 each Topical Q0600  . darbepoetin (ARANESP) injection - DIALYSIS  60 mcg Intravenous Q Mon-HD  . isosorbide mononitrate  60 mg Oral BID  . mouth rinse  15 mL Mouth Rinse BID  . sevelamer carbonate  1,600 mg Oral TID WC    Continuous Infusions:    Assessment/Plan:  1.  Bilateral lower extremity weakness: Initially concern for myositis however normal CK/CRP, mildly elevated ESR.  Imaging studies of lower extremities negative.  Stroke ruled out.  Seen by neurology and recommended outpatient EMG/neuropsych evaluation.  Monitoring off statins.  Seen by physical therapy and recommended SNF  2.  Acute metabolic encephalopathy: Possibly related to hypercalcemia.  Did received a dose of bisphosphonate on 6/2 for hypercalcemia.MRI brain did show age-related cerebral atrophy and advanced chronic microvascular ischemic disease, scattered remote lacunar infarcts in basal ganglia/cerebellum.  She may have vascular dementia/slowly progressing cognitive impairment.  Unclear how far she is from baseline.  Needs neuropsychology evaluation as outpatient.  Correct metabolic derangements. She is communicating decently today but at times appears to have tangential mumbling.  Delirium precautions.  3.  Hypercalcemia: Corrected calcium at 12.2 on 6/2 with elevated PTH and received a dose of pamidronate as discussed above per nephrology  recommendations..  Continue hemodialysis-low calcium bath, holding vitamin D.  Today calcium level at 11.1 and albumin 2.4, corrected calcium >12.  Check ionized calcium in a.m.  4.  ESRD on HD, AOCD: Being followed by nephrology for ##3 and ongoing HD.  Received blood transfusion yesterday for hemoglobin 6.2->improved to 8.6 today.  5.  Paroxysmal atrial fibrillation: Amiodarone discontinued in the setting of elevated TSH.  Not a candidate for long-term anticoagulation due to recurrent GI bleeds.  6.  Hypertension: On Imdur--consider transitioning to alternate medications like hydralazine given recent report of hypotension/syncope and dialysis.  Renal to weigh in ultimately but reduced Imdur from 60 mg twice daily to 30 mg daily with holding parameters given SBP in the 90s.  Hydralazine as needed  available.  7.  Chronic diastolic CHF: No concerns for volume overload.  Continue current medications, HD  8.  Dry cough: Will give Robitussin as needed and send COVID PCR (will need for SNF placement in any case), afebrile denies any sore throat.  DVT prophylaxis: SCDs Code Status: Full code Family / Patient Communication: Husband not at bedside. Disposition Plan:   Status is: Inpatient  Remains inpatient appropriate because:Unsafe d/c plan, persistent electrolyte abnormality and AMS   Dispo: The patient is from: Home              Anticipated d/c is to: SNF.  COVID screen today.              Patient currently is not medically stable to d/c.  Persistent electrolyte abnormality, mental status not to baseline.   Difficult to place patient No           Time spent: 25 MINS     >50% time spent in discussions with care team and coordination of care.     , MD [facility] Pager in Amion  If 7PM-7AM, please contact night-coverage [email or web address] 12/12/2020, 8:27 PM  

## 2020-12-12 NOTE — Progress Notes (Signed)
[facility] Progress Note   Subjective:    Seen in room. Somnolent and appears confused this am.    Objective Vitals:   12/11/20 1229 12/11/20 2108 12/11/20 2109 12/12/20 0412  BP: (!) 161/46 (!) 98/46 (!) 139/42 (!) 156/51  Pulse: 77 70 77 78  Resp: 17 20 19 18   Temp: (!) 97.3 F (36.3 C) (!) 97.5 F (36.4 C) 99.3 F (37.4 C) 97.8 F (36.6 C)  TempSrc: Axillary Oral Oral Oral  SpO2: 95% 94%  100%  Weight: 54.4 kg      Physical Exam General:WDWN elderly female in NAD Heart:RRR, no murmurs Lungs:CTA bilaterally without wheezing, rhonchi or rales Abdomen:Soft, non-distended, +BS Extremities:No edema b/l lower extremities Dialysis Access:LUE AVF + t/b  Additional Objective Labs: Basic Metabolic Panel: Recent Labs  Lab 12/09/20 0808 12/10/20 0046 12/11/20 0427  NA 139 137 139  K 4.8 3.7 4.3  CL 100 97* 101  CO2 30 33* 28  GLUCOSE 138* 117* 86  BUN 35* 11 24*  CREATININE 7.99* 4.04* 6.91*  CALCIUM 12.3* 11.3* 11.1*  PHOS  --  3.8 5.1*   Liver Function Tests: Recent Labs  Lab 12/09/20 0808 12/10/20 0046 12/11/20 0427  AST 29 30 21   ALT 25 25 20   ALKPHOS 44 49 40  BILITOT 0.6 0.7 0.6  PROT 5.8* 6.6 5.2*  ALBUMIN 2.6* 2.9* 2.4*   No results for input(s): LIPASE, AMYLASE in the last 168 hours. CBC: Recent Labs  Lab 12/06/20 0103 12/09/20 0808 12/10/20 0046 12/11/20 0427  WBC 9.8 9.6 9.4 7.5  NEUTROABS  --  6.8 6.7 5.1  HGB 7.8* 7.4* 7.3* 6.2*  HCT 24.9* 24.7* 24.0* 20.1*  MCV 115.3* 115.4* 115.9* 116.2*  PLT 328 355 376 307   Blood Culture    Component Value Date/Time   SDES BLOOD RIGHT ARM 10/10/2016 0124   SPECREQUEST IN PEDIATRIC BOTTLE Blood Culture adequate volume 10/10/2016 0124   CULT NO GROWTH 5 DAYS 10/10/2016 0124   REPTSTATUS 10/15/2016 FINAL 10/10/2016 0124   Medications:  . sodium chloride   Intravenous Once  . aspirin EC  81 mg Oral Daily  . Chlorhexidine Gluconate Cloth  6 each Topical Q0600  .  darbepoetin (ARANESP) injection - DIALYSIS  60 mcg Intravenous Q Mon-HD  . isosorbide mononitrate  60 mg Oral BID  . mouth rinse  15 mL Mouth Rinse BID  . sevelamer carbonate  1,600 mg Oral TID WC    [facility] EDW61kgHD Bath 2K/2.5CaTime 3.5 hrsHeparin none. AccessLUE AVFBFR Nipro Cellentia 17h Blood [id number]  Hectoral 30mcg IV/HD  Epogen 8000Units IV/HDgiven 12/02/20  Assessment/Plan: 1. ESRD- continue MWF schedule. Next HD 6/6.  2. Hypertension/volume-h/ononcomplaincewith medications and came in w/ hypertensive urgency. BP still moderately elevated but reportedly had possible syncope on HD Wed.  She is now below her previous outpatient EDW with no volume excess on exam. Back on imdur. Many med intolerances including amlodipine and hydralazine.  Also did not tolerate ranexa and had hypotension with beta blocker per cardiology notes. Could consider trial of very low dose beta blocker if BP remains elevated.  3. Anemia-Hgb 6.2 on 6/3 Transfused 1 unit prbcs --check Hgb today.  No bleeding reported.Started aranesp 29 with HD on 12/07/20.(previously on epogen). Tsat 20% but has Fe allergy in chart.  4. Metabolic bone disease/Hypercalcemia. Corrected calcium elevated despite low calcium bath and holding VDRA. Unclear cause-suspect immobility most likely.PTH 69. Hypercalcemia may be contributing to her confusion. S/p pamidronate 60mg  on  6/2. Follow labs.  5. Weakness-neurology on board; suspecting statin but she has also been on for many years. Recent admission for weakness which improved with discontinuation of gabapentin + RRT. She feels the weakness is much better. Neuro recommendscont holding statin, outpatient EMG and neuropsych eval. 6. Nutrition-renal diet 7. H/o myoclonus - Do not resume gabapentin as likely source of myoclonus and leg weakness-> improved after gabapentin was stopped last admission +  RRT. 8. DM type 2 - per primary 9. Vascular access - have had issues with avf inpastbut working well here so far. 10. Dispo: Awaiting SNF   [place] PA-C [facility] 12/12/2020,10:26 AM

## 2020-12-13 DIAGNOSIS — M25572 Pain in left ankle and joints of left foot: Secondary | ICD-10-CM

## 2020-12-13 DIAGNOSIS — M25571 Pain in right ankle and joints of right foot: Secondary | ICD-10-CM

## 2020-12-13 NOTE — Progress Notes (Addendum)
[facility] Progress Note   Subjective:    Seen in room. A little more alert this am. No complaints.    Objective Vitals:   12/12/20 0412 12/12/20 1454 12/12/20 2015 12/13/20 0506  BP: (!) 156/51 (!) 126/36 94/67 (!) 103/48  Pulse: 78 68  70  Resp: 18 16 17 18   Temp: 97.8 F (36.6 C)  97.9 F (36.6 C) 98.4 F (36.9 C)  TempSrc: Oral  Oral Oral  SpO2: 100% 94%  96%  Weight:    55.2 kg   Physical Exam General:WDWN elderly female in NAD Heart:RRR, no murmurs Lungs:CTA bilaterally without wheezing, rhonchi or rales Abdomen:Soft, non-distended, +BS Extremities:No edema b/l lower extremities Dialysis Access:LUE AVF + t/b  Additional Objective Labs: Basic Metabolic Panel: Recent Labs  Lab 12/09/20 0808 12/10/20 0046 12/11/20 0427  NA 139 137 139  K 4.8 3.7 4.3  CL 100 97* 101  CO2 30 33* 28  GLUCOSE 138* 117* 86  BUN 35* 11 24*  CREATININE 7.99* 4.04* 6.91*  CALCIUM 12.3* 11.3* 11.1*  PHOS  --  3.8 5.1*   Liver Function Tests: Recent Labs  Lab 12/09/20 0808 12/10/20 0046 12/11/20 0427  AST 29 30 21   ALT 25 25 20   ALKPHOS 44 49 40  BILITOT 0.6 0.7 0.6  PROT 5.8* 6.6 5.2*  ALBUMIN 2.6* 2.9* 2.4*   No results for input(s): LIPASE, AMYLASE in the last 168 hours. CBC: Recent Labs  Lab 12/09/20 0808 12/10/20 0046 12/11/20 0427 12/12/20 1036  WBC 9.6 9.4 7.5  --   NEUTROABS 6.8 6.7 5.1  --   HGB 7.4* 7.3* 6.2* 8.6*  HCT 24.7* 24.0* 20.1* 27.3*  MCV 115.4* 115.9* 116.2*  --   PLT 355 376 307  --    Blood Culture    Component Value Date/Time   SDES BLOOD RIGHT ARM 10/10/2016 0124   SPECREQUEST IN PEDIATRIC BOTTLE Blood Culture adequate volume 10/10/2016 0124   CULT NO GROWTH 5 DAYS 10/10/2016 0124   REPTSTATUS 10/15/2016 FINAL 10/10/2016 0124   Medications:  . sodium chloride   Intravenous Once  . aspirin EC  81 mg Oral Daily  . Chlorhexidine Gluconate Cloth  6 each Topical Q0600  . darbepoetin (ARANESP) injection -  DIALYSIS  60 mcg Intravenous Q Mon-HD  . isosorbide mononitrate  30 mg Oral Daily  . mouth rinse  15 mL Mouth Rinse BID  . sevelamer carbonate  1,600 mg Oral TID WC    [facility] EDW61kgHD Bath 2K/2.5CaTime 3.5 hrsHeparin none. AccessLUE AVFBFR Nipro Cellentia 17h Blood [id number]  Hectoral 90mcg IV/HD  Epogen 8000Units IV/HDgiven 12/02/20  Assessment/Plan: 1. ESRD- continue MWF schedule. Next HD 6/6.  2. Hypertension/volume-h/ononcomplaincewith medications and came in w/ hypertensive urgency. BP still moderately elevated but reportedly had possible syncope on HD Wed.  She is now below her previous outpatient EDW with no volume excess on exam. Back on imdur. Many med intolerances including amlodipine and hydralazine.  Also did not tolerate ranexa and had hypotension with beta blocker per cardiology notes. Could consider trial of very low dose beta blocker if BP remains elevated.  3. Anemia-Hgb 6.2 on 6/3 Transfused 1 unit prbcs --improved to 8.6.  No bleeding reported.Started aranesp 69 with HD on 12/07/20.(previously on epogen). Tsat 20% but has Fe allergy in chart.  4. Metabolic bone disease/Hypercalcemia. Corrected calcium elevated despite low calcium bath and holding VDRA. Unclear cause-suspect immobility most likely.PTH 69. Hypercalcemia may be contributing to her confusion. S/p pamidronate  60mg  on 6/2, calcium levels are improving now. Follow labs.  5. Weakness-neurology on board; suspecting statin but she has also been on for many years. Recent admission for weakness which improved with discontinuation of gabapentin + RRT. She feels the weakness is much better. Neuro recommendscont holding statin, outpatient EMG and neuropsych eval. 6. Nutrition-renal diet 7. H/o myoclonus - Do not resume gabapentin as likely source of myoclonus and leg weakness-> improved after gabapentin was stopped last admission  + RRT. 8. DM type 2 - per primary 9. Vascular access - have had issues with avf inpastbut working well here so far. 10. Dispo: Awaiting SNF placement    Ogechi Larina Earthly PA-C [facility] 12/13/2020,10:40 AM   Pt seen, examined and agree w assess/plan as above with additions as indicated.  [facility] 12/13/2020, 11:28 AM

## 2020-12-13 NOTE — Progress Notes (Signed)
PROGRESS NOTE        PATIENT DETAILS Name: Shawna [name] Age: 80 y.o. Sex: female Date of Birth: Mar 03, 1941 Admit Date: 12/02/2020 Admitting Physician Rhetta Mura, DO IZT:[id number], [facility]  Brief Narrative: Patient is a 80 y.o. female ESRD on HD, PAF, chronic diastolic heart failure, HTN, HLD who presented with worsening lower extremity weakness-with concerns for myositis on admission.  She underwent neurology eval-work-up was negative for myositis.  Hospital course complicated by acute metabolic encephalopathy, hypercalcemia and worsening anemia.  See below for further details.  Significant studies: 5/25>> CT head: No acute intracranial pathology. 5/25>> MRI brain: No acute intracranial abnormality. 5/26>> MRI left femur: No acute abnormality 5/26>> CXR: Resolution of pulm edema, left basilar scarring. 5/26>> SPEP: No M spike 5/26>> TSH: 9.19 5/26>> CK: 47 5/28>> x-ray left ankle: No fracture/dislocation  Antimicrobial therapy: None  Microbiology data: 5/25>> COVID PCR: Negative  Procedures : None  Consults: Neurology, nephrology  DVT Prophylaxis : SCDs Start: 12/02/20 2349   Subjective: Seen in HD-appears weak/lethargic-hemoglobin 6.2 this morning-awaiting transfusion.   Assessment/Plan:  Bilateral lower extremity weakness: Neuroimaging as above-evaluated by neurology with recommendations for outpatient EMG/neuropsych evaluation.  CK within normal limits.  Needs SNF on discharge.  Able to lift both lower extremities off the bed against mild resistance.  Acute metabolic encephalopathy: MRI brain negative-suspect this is probably mild metabolic encephalopathy in the setting of hypercalcemia.  Seems somewhat better today-although no family at bedside-but answering simple questions appropriately.  Hypercalcemia: S/p pamidronate on 6/2-nephrology following and directing care.  PAF: Maintaining sinus rhythm-prior MD discussed with  cardiology-amiodarone was discontinued in the setting of mildly elevated TSH.  Not a candidate for anticoagulation due to recurrent GI bleeding.  ESRD on HD: Nephrology following and directing inpatient HD care.  Syncope: Occurred on 6/1-in the setting of hypotension during HD.  Continue close monitoring.  HTN: BP reasonable-continue Imdur  Chronic diastolic heart failure: Volume management with HD.  Normocytic anemia: Due to ESRD-worsened by acute illness-no GI bleeding apparent.  Being transfused PRBC today.  Repeat CBC in AM.  Left ankle pain: Imaging negative-supportive care continues.   Diet: Diet Order            Diet renal with fluid restriction Fluid restriction: 1800 mL Fluid; Room service appropriate? No; Fluid consistency: Thin  Diet effective now                  Code Status: Full code   Family Communication: Spouse-[phone]-called on 6/3-unable to leave voicemail.  Disposition Plan: Status is: Inpatient  Remains inpatient appropriate because:Inpatient level of care appropriate due to severity of illness   Dispo: The patient is from: Home              Anticipated d/c is to: SNF              Patient currently is not medically stable to d/c.   Difficult to place patient No     Barriers to Discharge: Resolving encephalopathy-worsening anemia-being transfused 1 unit of PRBC with hemodialysis today.  Antimicrobial agents: Anti-infectives (From admission, onward)   None       Time spent: 35 minutes-Greater than 50% of this time was spent in counseling, explanation of diagnosis, planning of further management, and coordination of care.  MEDICATIONS: Scheduled Meds: . sodium chloride   Intravenous Once  .  aspirin EC  81 mg Oral Daily  . Chlorhexidine Gluconate Cloth  6 each Topical Q0600  . darbepoetin (ARANESP) injection - DIALYSIS  60 mcg Intravenous Q Mon-HD  . isosorbide mononitrate  30 mg Oral Daily  . mouth rinse  15 mL Mouth  Rinse BID  . sevelamer carbonate  1,600 mg Oral TID WC   Continuous Infusions: PRN Meds:.benzonatate, hydrALAZINE, hydrOXYzine   PHYSICAL EXAM: Vital signs: Vitals:   12/12/20 0412 12/12/20 1454 12/12/20 2015 12/13/20 0506  BP: (!) 156/51 (!) 126/36 94/67 (!) 103/48  Pulse: 78 68  70  Resp: 18 16 17 18   Temp: 97.8 F (36.6 C)  97.9 F (36.6 C) 98.4 F (36.9 C)  TempSrc: Oral  Oral Oral  SpO2: 100% 94%  96%  Weight:    55.2 kg   Filed Weights   12/11/20 1150 12/11/20 1229 12/13/20 0506  Weight: 52 kg 54.4 kg 55.2 kg   Body mass index is 22.99 kg/m.   Gen Exam:  Awake Alert, No new F.N deficits,   [place].AT,PERRAL Supple Neck,No JVD, No cervical lymphadenopathy appriciated.  Symmetrical Chest wall movement, Good air movement bilaterally, CTAB RRR,No Gallops, Rubs or new Murmurs, No Parasternal Heave +ve B.Sounds, Abd Soft, No tenderness, No organomegaly appriciated, No rebound - guarding or rigidity. No Cyanosis, Clubbing or edema, No new Rash or bruise   I have personally reviewed following labs and imaging studies  LABORATORY DATA: CBC: Recent Labs  Lab 12/09/20 0808 12/10/20 0046 12/11/20 0427 12/12/20 1036  WBC 9.6 9.4 7.5  --   NEUTROABS 6.8 6.7 5.1  --   HGB 7.4* 7.3* 6.2* 8.6*  HCT 24.7* 24.0* 20.1* 27.3*  MCV 115.4* 115.9* 116.2*  --   PLT 355 376 307  --     Basic Metabolic Panel: Recent Labs  Lab 12/09/20 0808 12/10/20 0046 12/11/20 0427  NA 139 137 139  K 4.8 3.7 4.3  CL 100 97* 101  CO2 30 33* 28  GLUCOSE 138* 117* 86  BUN 35* 11 24*  CREATININE 7.99* 4.04* 6.91*  CALCIUM 12.3* 11.3* 11.1*  MG  --  2.0 2.0  PHOS  --  3.8 5.1*    GFR: Estimated Creatinine Clearance: 4.9 mL/min (A) (by C-G formula based on SCr of 6.91 mg/dL (H)).  Liver Function Tests: Recent Labs  Lab 12/09/20 0808 12/10/20 0046 12/11/20 0427  AST 29 30 21   ALT 25 25 20   ALKPHOS 44 49 40  BILITOT 0.6 0.7 0.6  PROT 5.8* 6.6 5.2*  ALBUMIN 2.6* 2.9* 2.4*    No results for input(s): LIPASE, AMYLASE in the last 168 hours. Recent Labs  Lab 12/11/20 0427  AMMONIA 27    Coagulation Profile: No results for input(s): INR, PROTIME in the last 168 hours.  Cardiac Enzymes: No results for input(s): CKTOTAL, CKMB, CKMBINDEX, TROPONINI in the last 168 hours.  BNP (last 3 results) No results for input(s): PROBNP in the last 8760 hours.  Lipid Profile: No results for input(s): CHOL, HDL, LDLCALC, TRIG, CHOLHDL, LDLDIRECT in the last 72 hours.  Thyroid Function Tests: No results for input(s): TSH, T4TOTAL, FREET4, T3FREE, THYROIDAB in the last 72 hours.  Anemia Panel: Recent Labs    12/11/20 0427  VITAMINB12 793  FOLATE 22.5  FERRITIN 729*  TIBC 178*  IRON 35    Urine analysis:    Component Value Date/Time   COLORURINE YELLOW 12/16/2012 1919   APPEARANCEUR CLOUDY (A) 12/16/2012 1919   LABSPEC 1.009 12/16/2012 1919  PHURINE 7.5 12/16/2012 1919   GLUCOSEU NEGATIVE 12/16/2012 1919   HGBUR TRACE (A) 12/16/2012 1919   BILIRUBINUR NEGATIVE 12/16/2012 1919   KETONESUR NEGATIVE 12/16/2012 1919   PROTEINUR 100 (A) 12/16/2012 1919   UROBILINOGEN 0.2 12/16/2012 1919   NITRITE NEGATIVE 12/16/2012 1919   LEUKOCYTESUR SMALL (A) 12/16/2012 1919    Sepsis Labs: Lactic Acid, Venous    Component Value Date/Time   LATICACIDVEN 1.84 03/22/2017 0615    MICROBIOLOGY: No results found for this or any previous visit (from the past 240 hour(s)).  RADIOLOGY STUDIES/RESULTS: No results found.   LOS: 11 days   Signature  Lala Lund M.D on 12/13/2020 at 10:07 AM   -  To page go to [email or web address]

## 2020-12-14 DIAGNOSIS — G8929 Other chronic pain: Secondary | ICD-10-CM

## 2020-12-14 LAB — CBC WITH DIFFERENTIAL/PLATELET
Abs Immature Granulocytes: 0.04 10*3/uL (ref 0.00–0.07)
Basophils Absolute: 0.1 10*3/uL (ref 0.0–0.1)
Basophils Relative: 1 %
Eosinophils Absolute: 0.8 10*3/uL — ABNORMAL HIGH (ref 0.0–0.5)
Eosinophils Relative: 11 %
HCT: 25.5 % — ABNORMAL LOW (ref 36.0–46.0)
Hemoglobin: 7.8 g/dL — ABNORMAL LOW (ref 12.0–15.0)
Immature Granulocytes: 1 %
Lymphocytes Relative: 11 %
Lymphs Abs: 0.8 10*3/uL (ref 0.7–4.0)
MCH: 33.3 pg (ref 26.0–34.0)
MCHC: 30.6 g/dL (ref 30.0–36.0)
MCV: 109 fL — ABNORMAL HIGH (ref 80.0–100.0)
Monocytes Absolute: 0.8 10*3/uL (ref 0.1–1.0)
Monocytes Relative: 11 %
Neutro Abs: 4.6 10*3/uL (ref 1.7–7.7)
Neutrophils Relative %: 65 %
Platelets: 315 10*3/uL (ref 150–400)
RBC: 2.34 MIL/uL — ABNORMAL LOW (ref 3.87–5.11)
RDW: 19.6 % — ABNORMAL HIGH (ref 11.5–15.5)
WBC: 7.1 10*3/uL (ref 4.0–10.5)
nRBC: 0 % (ref 0.0–0.2)

## 2020-12-14 LAB — COMPREHENSIVE METABOLIC PANEL
ALT: 16 U/L (ref 0–44)
AST: 20 U/L (ref 15–41)
Albumin: 2.4 g/dL — ABNORMAL LOW (ref 3.5–5.0)
Alkaline Phosphatase: 39 U/L (ref 38–126)
Anion gap: 11 (ref 5–15)
BUN: 42 mg/dL — ABNORMAL HIGH (ref 8–23)
CO2: 28 mmol/L (ref 22–32)
Calcium: 9.7 mg/dL (ref 8.9–10.3)
Chloride: 97 mmol/L — ABNORMAL LOW (ref 98–111)
Creatinine, Ser: 9.47 mg/dL — ABNORMAL HIGH (ref 0.44–1.00)
GFR, Estimated: 4 mL/min — ABNORMAL LOW (ref >60)
Glucose, Bld: 86 mg/dL (ref 70–99)
Potassium: 4.2 mmol/L (ref 3.5–5.1)
Sodium: 136 mmol/L (ref 135–145)
Total Bilirubin: 0.6 mg/dL (ref 0.3–1.2)
Total Protein: 5.4 g/dL — ABNORMAL LOW (ref 6.5–8.1)

## 2020-12-14 LAB — MAGNESIUM: Magnesium: 2.1 mg/dL (ref 1.7–2.4)

## 2020-12-14 MED ORDER — HALOPERIDOL LACTATE 5 MG/ML IJ SOLN
2.0000 mg | Freq: Four times a day (QID) | INTRAMUSCULAR | Status: DC | PRN
  Administered 2020-12-14: 2 mg via INTRAVENOUS
  Filled 2020-12-14: qty 1

## 2020-12-14 MED ORDER — DARBEPOETIN ALFA 60 MCG/0.3ML IJ SOSY
PREFILLED_SYRINGE | INTRAMUSCULAR | Status: AC
  Administered 2020-12-14: 60 ug via INTRAVENOUS
  Filled 2020-12-14: qty 0.3

## 2020-12-14 NOTE — Progress Notes (Signed)
PROGRESS NOTE        PATIENT DETAILS Name: Shawna [name] Age: 80 y.o. Sex: female Date of Birth: 13-May-1941 Admit Date: 12/02/2020 Admitting Physician Rhetta Mura, DO ZOX:[id number], [facility]  Brief Narrative: Patient is a 80 y.o. female ESRD on HD, PAF, chronic diastolic heart failure, HTN, HLD who presented with worsening lower extremity weakness-with concerns for myositis on admission.  She underwent neurology eval-work-up was negative for myositis.  Hospital course complicated by acute metabolic encephalopathy, hypercalcemia and worsening anemia.  See below for further details.  Significant studies: 5/25>> CT head: No acute intracranial pathology. 5/25>> MRI brain: No acute intracranial abnormality. 5/26>> MRI left femur: No acute abnormality 5/26>> CXR: Resolution of pulm edema, left basilar scarring. 5/26>> SPEP: No M spike 5/26>> TSH: 9.19 5/26>> CK: 47 5/28>> x-ray left ankle: No fracture/dislocation  Antimicrobial therapy: None  Microbiology data: 5/25>> COVID PCR: Negative  Procedures : None  Consults: Neurology, nephrology  DVT Prophylaxis : SCDs Start: 12/02/20 2349   Subjective:  Patient in bed, appears comfortable, denies any headache, no fever, no chest pain or pressure, no shortness of breath , no abdominal pain. No new focal weakness.   Assessment/Plan:  Bilateral lower extremity weakness: Neuroimaging as above-evaluated by neurology with recommendations for outpatient EMG/neuropsych evaluation.  CK within normal limits.  Needs SNF on discharge.  Able to lift both lower extremities off the bed against mild resistance.  Acute metabolic encephalopathy: MRI brain negative-suspect this is probably mild metabolic encephalopathy in the setting of hypercalcemia.  Seems somewhat better today-although no family at bedside-but answering simple questions appropriately.  Hypercalcemia: S/p pamidronate on 6/2-nephrology  following and directing care.  PAF: Maintaining sinus rhythm-prior MD discussed with cardiology-amiodarone was discontinued in the setting of mildly elevated TSH.  Not a candidate for anticoagulation due to recurrent GI bleeding.  Repeat TSH along with free T4 and T3 in 7 to 10 days post discharge.  Lab Results  Component Value Date   TSH 9.193 (H) 12/03/2020    ESRD on HD: Nephrology following and directing inpatient HD care.  Syncope: Occurred on 6/1-in the setting of hypotension during HD.  Continue close monitoring.  HTN: BP reasonable-continue Imdur  Chronic diastolic heart failure: Volume management with HD.  Normocytic anemia: Due to ESRD-worsened by acute illness-no GI bleeding apparent.  Being transfused PRBC today.  Repeat CBC in AM.  Left ankle pain: Imaging negative-supportive care continues.   Diet: Diet Order            Diet renal with fluid restriction Fluid restriction: 1800 mL Fluid; Room service appropriate? No; Fluid consistency: Thin  Diet effective now                  Code Status: Full code   Family Communication: Spouse-Nathaniel-[phone]-called on 6/3-unable to leave voicemail.  Disposition Plan: Status is: Inpatient  Remains inpatient appropriate because:Inpatient level of care appropriate due to severity of illness   Dispo: The patient is from: Home              Anticipated d/c is to: SNF              Patient currently is not medically stable to d/c.   Difficult to place patient No     Barriers to Discharge: Resolving encephalopathy-worsening anemia-being transfused 1 unit of PRBC with hemodialysis today.  Antimicrobial agents: Anti-infectives (From admission, onward)   None       Time spent: 35 minutes-Greater than 50% of this time was spent in counseling, explanation of diagnosis, planning of further management, and coordination of care.  MEDICATIONS: Scheduled Meds: . sodium chloride   Intravenous Once  . aspirin EC   81 mg Oral Daily  . Chlorhexidine Gluconate Cloth  6 each Topical Q0600  . darbepoetin (ARANESP) injection - DIALYSIS  60 mcg Intravenous Q Mon-HD  . isosorbide mononitrate  30 mg Oral Daily  . mouth rinse  15 mL Mouth Rinse BID  . sevelamer carbonate  1,600 mg Oral TID WC   Continuous Infusions: PRN Meds:.benzonatate, hydrALAZINE, hydrOXYzine   PHYSICAL EXAM: Vital signs: Vitals:   12/14/20 0830 12/14/20 0831 12/14/20 0900 12/14/20 0930  BP: (!) 156/46 (!) 158/42 (!) 148/39 (!) 149/41  Pulse: 66 66 68 69  Resp: 14 14    Temp:      TempSrc:      SpO2:      Weight:       Filed Weights   12/11/20 1229 12/13/20 0506 12/14/20 0821  Weight: 54.4 kg 55.2 kg 54.2 kg   Body mass index is 22.58 kg/m.   Gen Exam:  Awake in no distress but pleasantly confused, No new F.N deficits,   [place].AT,PERRAL Supple Neck,No JVD, No cervical lymphadenopathy appriciated.  Symmetrical Chest wall movement, Good air movement bilaterally, CTAB RRR,No Gallops, Rubs or new Murmurs, No Parasternal Heave +ve B.Sounds, Abd Soft, No tenderness, No organomegaly appriciated, No rebound - guarding or rigidity. No Cyanosis, Clubbing or edema, No new Rash or bruise    I have personally reviewed following labs and imaging studies  LABORATORY DATA: CBC: Recent Labs  Lab 12/09/20 0808 12/10/20 0046 12/11/20 0427 12/12/20 1036 12/14/20 0146  WBC 9.6 9.4 7.5  --  7.1  NEUTROABS 6.8 6.7 5.1  --  4.6  HGB 7.4* 7.3* 6.2* 8.6* 7.8*  HCT 24.7* 24.0* 20.1* 27.3* 25.5*  MCV 115.4* 115.9* 116.2*  --  109.0*  PLT 355 376 307  --  540    Basic Metabolic Panel: Recent Labs  Lab 12/09/20 0808 12/10/20 0046 12/11/20 0427 12/14/20 0146  NA 139 137 139 136  K 4.8 3.7 4.3 4.2  CL 100 97* 101 97*  CO2 30 33* 28 28  GLUCOSE 138* 117* 86 86  BUN 35* 11 24* 42*  CREATININE 7.99* 4.04* 6.91* 9.47*  CALCIUM 12.3* 11.3* 11.1* 9.7  MG  --  2.0 2.0 2.1  PHOS  --  3.8 5.1*  --     GFR: Estimated Creatinine  Clearance: 3.6 mL/min (A) (by C-G formula based on SCr of 9.47 mg/dL (H)).  Liver Function Tests: Recent Labs  Lab 12/09/20 0808 12/10/20 0046 12/11/20 0427 12/14/20 0146  AST 29 30 21 20   ALT 25 25 20 16   ALKPHOS 44 49 40 39  BILITOT 0.6 0.7 0.6 0.6  PROT 5.8* 6.6 5.2* 5.4*  ALBUMIN 2.6* 2.9* 2.4* 2.4*   No results for input(s): LIPASE, AMYLASE in the last 168 hours. Recent Labs  Lab 12/11/20 0427  AMMONIA 27    Coagulation Profile: No results for input(s): INR, PROTIME in the last 168 hours.  Cardiac Enzymes: No results for input(s): CKTOTAL, CKMB, CKMBINDEX, TROPONINI in the last 168 hours.  BNP (last 3 results) No results for input(s): PROBNP in the last 8760 hours.  Lipid Profile: No results for input(s): CHOL, HDL, LDLCALC, TRIG, CHOLHDL, LDLDIRECT  in the last 72 hours.  Thyroid Function Tests: No results for input(s): TSH, T4TOTAL, FREET4, T3FREE, THYROIDAB in the last 72 hours.  Anemia Panel: No results for input(s): VITAMINB12, FOLATE, FERRITIN, TIBC, IRON, RETICCTPCT in the last 72 hours.  Urine analysis:    Component Value Date/Time   COLORURINE YELLOW 12/16/2012 1919   APPEARANCEUR CLOUDY (A) 12/16/2012 1919   LABSPEC 1.009 12/16/2012 1919   PHURINE 7.5 12/16/2012 1919   GLUCOSEU NEGATIVE 12/16/2012 1919   HGBUR TRACE (A) 12/16/2012 1919   BILIRUBINUR NEGATIVE 12/16/2012 1919   KETONESUR NEGATIVE 12/16/2012 1919   PROTEINUR 100 (A) 12/16/2012 1919   UROBILINOGEN 0.2 12/16/2012 1919   NITRITE NEGATIVE 12/16/2012 1919   LEUKOCYTESUR SMALL (A) 12/16/2012 1919    Sepsis Labs: Lactic Acid, Venous    Component Value Date/Time   LATICACIDVEN 1.84 03/22/2017 0615    MICROBIOLOGY: No results found for this or any previous visit (from the past 240 hour(s)).  RADIOLOGY STUDIES/RESULTS: No results found.   LOS: 12 days   Signature  Lala Lund M.D on 12/14/2020 at 9:51 AM   -  To page go to [email or web address]

## 2020-12-14 NOTE — TOC Progression Note (Signed)
Transition of Care [facility]) - Progression Note    Patient Details  Name: Shawna [name] MRN: [id number] Date of Birth: [birth year]  [facility] CM/SW [place], LCSW Phone Number: 12/14/2020, 1:28 PM  Clinical Narrative:    Patient's insurance approval for SNF expires today. CSW requested PT see patient for updated notes. [facility] will re-start insurance process.    Expected Discharge Plan: Skilled Nursing Facility Barriers to Discharge: Continued Medical Work up  Expected Discharge Plan and Services Expected Discharge Plan: [place] In-house Referral: Clinical Social Work   Post Acute Care Choice: [place] Living arrangements for the past 2 months: Single Family Home                                       Social Determinants of Health (SDOH) Interventions    Readmission Risk Interventions Readmission Risk Prevention Plan 11/11/2020 10/04/2020 09/02/2020  Transportation Screening Complete Complete Complete  PCP or Specialist Appt within 3-5 Days - - -  [place]RI or [place] Work Consult for [place] - - -  Medication Review [employer]) Complete Complete Complete  PCP or Specialist appointment within 3-5 days of discharge Complete Complete -  [place] or [place] Complete Complete Complete  SW Recovery Care/Counseling Consult Complete Complete Complete  Palliative Care Screening Not Applicable Not Applicable Not [place] Not Applicable Complete Patient Refused  Some recent data might be hidden

## 2020-12-14 NOTE — Progress Notes (Signed)
Physical Therapy Treatment Patient Details Name: Shawna [name] MRN: [id number] DOB: [birth year] Today's Date: 12/14/2020    History of Present Illness Pt is an 80 y/o female admitted 5/25 secondary to inability to walk and BLE weakness (LLE>RLE). Thought to be secondary to myopathy per neuro notes. PMH includes ESRD on HD, HTN, a fib, CAD, dCHF.    PT Comments    Pt remains pleasantly confused, oriented to self only. Stating she needs to use the bathroom and assisted onto bedside commode, but pt not recognizing it was a toilet in order to void. Pt requiring moderate assist for stand pivot transfers. Pt continues with generalized weakness, decreased cognition, and poor standing balance. Presents as a high fall risk. Continue to recommend SNF for ongoing Physical Therapy.      Follow Up Recommendations  SNF     Equipment Recommendations  Wheelchair (measurements PT);Wheelchair cushion (measurements PT)    Recommendations for Other Services       Precautions / Restrictions Precautions Precautions: Fall Precaution Comments: Has had multiple falls at home. Restrictions Weight Bearing Restrictions: No    Mobility  Bed Mobility Overal bed mobility: Needs Assistance Bed Mobility: Supine to Sit     Supine to sit: Mod assist     General bed mobility comments: Decreased initiation, requiring assist to get started and bring BLE's off edge of bed, assist at trunk to boost upright    Transfers Overall transfer level: Needs assistance Equipment used: Rolling walker (2 wheeled);1 person hand held assist Transfers: Sit to/from [facility] Sit to Stand: Mod assist Stand pivot transfers: Mod assist       General transfer comment: Face to face transfer with modA to pivot onto [facility] and then pivot to recliner. Guidance for hand to surface to facilitate turning in the correct direction. Increased trunk/hip flexion  Ambulation/Gait                 Stairs              Wheelchair Mobility    Modified Rankin (Stroke Patients Only)       Balance Overall balance assessment: Needs assistance Sitting-balance support: Feet supported;No upper extremity supported Sitting balance-Leahy Scale: Fair Sitting balance - Comments: Pt sat EOB without support.   Standing balance support: Bilateral upper extremity supported;During functional activity Standing balance-Leahy Scale: Poor Standing balance comment: Reliant on UE and external support                            Cognition Arousal/Alertness: Awake/alert Behavior During Therapy: WFL for tasks assessed/performed Overall Cognitive Status: Impaired/Different from baseline Area of Impairment: Orientation;Attention;Memory;Following commands;Safety/judgement;Problem solving                 Orientation Level: Disoriented to;Place;Time;Situation Current Attention Level: Sustained Memory: Decreased recall of precautions;Decreased short-term memory Following Commands: Follows one step commands inconsistently;Follows one step commands with increased time Safety/Judgement: Decreased awareness of safety;Decreased awareness of deficits   Problem Solving: Slow processing;Decreased initiation;Difficulty sequencing;Requires verbal cues;Requires tactile cues General Comments: Pt pleasantly confused. Pt easily distracted requiring frequent redirection to remain on task. Very fixated on people walking by her room, so curtain needed to be closed. Does not recognize when she is on the bedside commode      Exercises      General Comments        Pertinent Vitals/Pain Pain Assessment: Faces Faces Pain Scale: No hurt    Home Living  Prior Function            PT Goals (current goals can now be found in the care plan section) Acute Rehab PT Goals Patient Stated Goal: to go home PT Goal Formulation: With patient Time For Goal Achievement: 12/28/20 Potential to  Achieve Goals: Fair Progress towards PT goals: Progressing toward goals    Frequency    Min 2X/week      PT Plan Current plan remains appropriate    Co-evaluation              AM-PAC PT "6 Clicks" Mobility   Outcome Measure  Help needed turning from your back to your side while in a flat bed without using bedrails?: A Lot Help needed moving from lying on your back to sitting on the side of a flat bed without using bedrails?: A Lot Help needed moving to and from a bed to a chair (including a wheelchair)?: A Lot Help needed standing up from a chair using your arms (e.g., wheelchair or bedside chair)?: A Lot Help needed to walk in hospital room?: Total Help needed climbing 3-5 steps with a railing? : Total 6 Click Score: 10    End of Session Equipment Utilized During Treatment: Gait belt Activity Tolerance: Patient tolerated treatment well Patient left: with call bell/phone within reach;in chair;with chair alarm set Nurse Communication: Mobility status PT Visit Diagnosis: Unsteadiness on feet (R26.81);Muscle weakness (generalized) (M62.81)     Time: 2703-5009 PT Time Calculation (min) (ACUTE ONLY): 36 min  Charges:  $Therapeutic Activity: 23-37 mins                     Wyona Almas, PT, DPT Acute Rehabilitation Services Pager [phone] Office [phone]    Deno Etienne 12/14/2020, 3:59 PM

## 2020-12-14 NOTE — Progress Notes (Addendum)
[facility] Progress Note   Subjective: HD earlier today. No issues but worsened agitation post HD. Says she is out in a field waiting for her husband without shoes or coat.   Objective Vitals:   12/14/20 1100 12/14/20 1130 12/14/20 1200 12/14/20 1218  BP: (!) 121/95 (!) 111/35  (!) 138/46  Pulse:  94 68 69  Resp:    16  Temp:    (!) 97.5 F (36.4 C)  TempSrc:    Oral  SpO2:      Weight:    52.8 kg   Physical Exam General: Chronically ill elderly female in NAD very agitated.  Heart: S1,S2 RRR No R/G Lungs:CTAB Abdomen: S, NT, ND Extremities: No LE edema Dialysis Access:. AVF +T/B   Additional Objective Labs: Basic Metabolic Panel: Recent Labs  Lab 12/10/20 0046 12/11/20 0427 12/14/20 0146  NA 137 139 136  K 3.7 4.3 4.2  CL 97* 101 97*  CO2 33* 28 28  GLUCOSE 117* 86 86  BUN 11 24* 42*  CREATININE 4.04* 6.91* 9.47*  CALCIUM 11.3* 11.1* 9.7  PHOS 3.8 5.1*  --    Liver Function Tests: Recent Labs  Lab 12/10/20 0046 12/11/20 0427 12/14/20 0146  AST 30 21 20   ALT 25 20 16   ALKPHOS 49 40 39  BILITOT 0.7 0.6 0.6  PROT 6.6 5.2* 5.4*  ALBUMIN 2.9* 2.4* 2.4*   No results for input(s): LIPASE, AMYLASE in the last 168 hours. CBC: Recent Labs  Lab 12/09/20 0808 12/10/20 0046 12/11/20 0427 12/12/20 1036 12/14/20 0146  WBC 9.6 9.4 7.5  --  7.1  NEUTROABS 6.8 6.7 5.1  --  4.6  HGB 7.4* 7.3* 6.2* 8.6* 7.8*  HCT 24.7* 24.0* 20.1* 27.3* 25.5*  MCV 115.4* 115.9* 116.2*  --  109.0*  PLT 355 376 307  --  315   Blood Culture    Component Value Date/Time   SDES BLOOD RIGHT ARM 10/10/2016 0124   SPECREQUEST IN PEDIATRIC BOTTLE Blood Culture adequate volume 10/10/2016 0124   CULT NO GROWTH 5 DAYS 10/10/2016 0124   REPTSTATUS 10/15/2016 FINAL 10/10/2016 0124    Cardiac Enzymes: No results for input(s): CKTOTAL, CKMB, CKMBINDEX, TROPONINI in the last 168 hours. CBG: No results for input(s): GLUCAP in the last 168 hours. Iron Studies: No  results for input(s): IRON, TIBC, TRANSFERRIN, FERRITIN in the last 72 hours. @lablastinr3 @ Studies/Results: No results found. Medications:  . sodium chloride   Intravenous Once  . aspirin EC  81 mg Oral Daily  . Chlorhexidine Gluconate Cloth  6 each Topical Q0600  . darbepoetin (ARANESP) injection - DIALYSIS  60 mcg Intravenous Q Mon-HD  . isosorbide mononitrate  30 mg Oral Daily  . mouth rinse  15 mL Mouth Rinse BID  . sevelamer carbonate  1,600 mg Oral TID WC     [facility] EDW61kgHD Bath 2K/2.5CaTime 3.5 hrsHeparin none. AccessLUE AVFBFR Nipro Cellentia 17h Blood [id number]  Hectoral 4mcg IV/HD  Epogen 8000Units IV/HDgiven 12/02/20  Assessment/Plan: 1. ESRD- continue MWF schedule. Next HD 6/6.  2. Hypertension/volume-h/ononcomplaincewith medications and came in w/ hypertensive urgency. BP still moderately elevated but reportedly had possible syncope on HD Wed.  She is now below her previous outpatient EDW with no volume excess on exam. Back on imdur. Many med intolerances including amlodipine and hydralazine.  Also did not tolerate ranexa and had hypotension with beta blocker per cardiology notes. Could consider trial of very low dose beta blocker if BP remains elevated.  3. Anemia-Hgb 6.2 on 6/3 Transfused 1 unit prbcs --improved to 8.6.  No bleeding reported.Started aranesp 37 with HD on 12/07/20.(previously on epogen). Tsat 20% but has Fe allergy in chart.  4. Metabolic bone disease/Hypercalcemia. Corrected calcium elevated despite low calcium bath and holding VDRA. Unclear cause-suspect immobility most likely.PTH 69. Hypercalcemia may be contributing to her confusion. S/p pamidronate 60mg  on 6/2, calcium levels are improving now. Follow labs.  5. Weakness-neurology on board; suspecting statin but she has also been on for many years. Recent admission for weakness which improved with  discontinuation of gabapentin + RRT. She feels the weakness is much better. Neuro recommendscont holding statin, outpatient EMG and neuropsych eval. 6. Nutrition-renal diet 7. H/o myoclonus - Do not resume gabapentin as likely source of myoclonus and leg weakness-> improved after gabapentin was stopped last admission + RRT. 8. DM type 2 - per primary 9. Vascular access - have had issues with avf inpastbut working well here so far. 10. Dispo: Awaiting SNF placement   Mandisa Persinger H. Leighla Chestnutt NP-C 12/14/2020, 12:38 PM  [employer] [phone]

## 2020-12-15 LAB — TYPE AND SCREEN
ABO/RH(D): O POS
Antibody Screen: NEGATIVE
Donor AG Type: NEGATIVE
Donor AG Type: NEGATIVE
Unit division: 0
Unit division: 0

## 2020-12-15 LAB — COMPREHENSIVE METABOLIC PANEL
ALT: 19 U/L (ref 0–44)
AST: 23 U/L (ref 15–41)
Albumin: 2.5 g/dL — ABNORMAL LOW (ref 3.5–5.0)
Alkaline Phosphatase: 41 U/L (ref 38–126)
Anion gap: 10 (ref 5–15)
BUN: 15 mg/dL (ref 8–23)
CO2: 27 mmol/L (ref 22–32)
Calcium: 9 mg/dL (ref 8.9–10.3)
Chloride: 99 mmol/L (ref 98–111)
Creatinine, Ser: 4.77 mg/dL — ABNORMAL HIGH (ref 0.44–1.00)
GFR, Estimated: 9 mL/min — ABNORMAL LOW (ref >60)
Glucose, Bld: 84 mg/dL (ref 70–99)
Potassium: 3.9 mmol/L (ref 3.5–5.1)
Sodium: 136 mmol/L (ref 135–145)
Total Bilirubin: 0.6 mg/dL (ref 0.3–1.2)
Total Protein: 5.5 g/dL — ABNORMAL LOW (ref 6.5–8.1)

## 2020-12-15 LAB — CALCIUM, IONIZED: Calcium, Ionized, Serum: 5.6 mg/dL (ref 4.5–5.6)

## 2020-12-15 LAB — BPAM RBC
Blood Product Expiration Date: 202206242359
Blood Product Expiration Date: 202207082359
ISSUE DATE / TIME: 202206031049
Unit Type and Rh: 5100
Unit Type and Rh: 5100

## 2020-12-15 LAB — CBC WITH DIFFERENTIAL/PLATELET
Abs Immature Granulocytes: 0.02 10*3/uL (ref 0.00–0.07)
Basophils Absolute: 0.1 10*3/uL (ref 0.0–0.1)
Basophils Relative: 2 %
Eosinophils Absolute: 0.5 10*3/uL (ref 0.0–0.5)
Eosinophils Relative: 8 %
HCT: 25.7 % — ABNORMAL LOW (ref 36.0–46.0)
Hemoglobin: 7.9 g/dL — ABNORMAL LOW (ref 12.0–15.0)
Immature Granulocytes: 0 %
Lymphocytes Relative: 12 %
Lymphs Abs: 0.8 10*3/uL (ref 0.7–4.0)
MCH: 33.5 pg (ref 26.0–34.0)
MCHC: 30.7 g/dL (ref 30.0–36.0)
MCV: 108.9 fL — ABNORMAL HIGH (ref 80.0–100.0)
Monocytes Absolute: 0.8 10*3/uL (ref 0.1–1.0)
Monocytes Relative: 12 %
Neutro Abs: 4 10*3/uL (ref 1.7–7.7)
Neutrophils Relative %: 66 %
Platelets: 329 10*3/uL (ref 150–400)
RBC: 2.36 MIL/uL — ABNORMAL LOW (ref 3.87–5.11)
RDW: 19.7 % — ABNORMAL HIGH (ref 11.5–15.5)
WBC: 6.2 10*3/uL (ref 4.0–10.5)
nRBC: 0 % (ref 0.0–0.2)

## 2020-12-15 LAB — PHOSPHORUS: Phosphorus: 2.5 mg/dL (ref 2.5–4.6)

## 2020-12-15 LAB — MAGNESIUM: Magnesium: 1.8 mg/dL (ref 1.7–2.4)

## 2020-12-15 LAB — T4, FREE: Free T4: 1.14 ng/dL — ABNORMAL HIGH (ref 0.61–1.12)

## 2020-12-15 LAB — TSH: TSH: 16.122 u[IU]/mL — ABNORMAL HIGH (ref 0.350–4.500)

## 2020-12-15 MED ORDER — LEVOTHYROXINE SODIUM 100 MCG PO TABS: 100.0000 ug | ORAL_TABLET | Freq: Every day | ORAL | Status: DC

## 2020-12-15 NOTE — Progress Notes (Signed)
Occupational Therapy Treatment Patient Details Name: Shawna [name] MRN: [id number] DOB: Nov 21, 1940 Today's Date: 12/15/2020    History of present illness Pt is an 80 y/o female admitted 5/25 secondary to inability to walk and BLE weakness (LLE>RLE). Thought to be secondary to myopathy per neuro notes. PMH includes ESRD on HD, HTN, a fib, CAD, dCHF.   OT comments  Pt making steady progress towards OT goals this session. Pt received sitting in bed having just finished lunch agreeable to OT intervention. Pt continues to present with impaired cognition, decreased safety awarness but following commands with increased time. Pt currently requires MIN A for functional mobiltiy with RW and total A for LB ADLs. Pt disoriented to month and location of hospital, no recall noted even after education provided. Pt would continue to benefit from skilled occupational therapy while admitted and after d/c to address the below listed limitations in order to improve overall functional mobility and facilitate independence with BADL participation. DC plan remains appropriate, will follow acutely per POC.     Follow Up Recommendations  SNF;Supervision/Assistance - 24 hour    Equipment Recommendations  None recommended by OT    Recommendations for Other Services      Precautions / Restrictions Precautions Precautions: Fall Precaution Comments: Has had multiple falls at home. Restrictions Weight Bearing Restrictions: No       Mobility Bed Mobility Overal bed mobility: Needs Assistance Bed Mobility: Supine to Sit     Supine to sit: Min guard     General bed mobility comments: Decreased initiation, min guard assist for safety and cues for sequencing task    Transfers Overall transfer level: Needs assistance Equipment used: Rolling walker (2 wheeled) Transfers: Sit to/from [facility] Sit to Stand: Min assist Stand pivot transfers: Min assist       General transfer comment: MIN A  to rise from EOB, MIN A to pivot needing cues for safety and assist to manage rw, cues for hand placement    Balance Overall balance assessment: Needs assistance Sitting-balance support: Feet supported;No upper extremity supported Sitting balance-Leahy Scale: Fair Sitting balance - Comments: Pt sat EOB without support.   Standing balance support: Bilateral upper extremity supported;During functional activity Standing balance-Leahy Scale: Poor Standing balance comment: Reliant on UE and external support                           ADL either performed or assessed with clinical judgement   ADL Overall ADL's : Needs assistance/impaired                         Toilet Transfer: Minimal assistance;Stand-pivot;BSC;RW Toilet Transfer Details (indicate cue type and reason): MIN A for steadying assist to pivot from EOB>BSC Toileting- Clothing Manipulation and Hygiene: Total assistance;Sit to/from stand Toileting - Clothing Manipulation Details (indicate cue type and reason): posterior pericare in standing     Functional mobility during ADLs: Minimal assistance;Rolling walker General ADL Comments: pt continues to present with cognitive deficits, impaired balance and impaired safey awareness     Vision       Perception     Praxis      Cognition Arousal/Alertness: Awake/alert Behavior During Therapy: WFL for tasks assessed/performed Overall Cognitive Status: Impaired/Different from baseline Area of Impairment: Orientation;Attention;Memory;Following commands;Safety/judgement;Awareness                 Orientation Level: Disoriented to;Time;Situation (states its May and then April, able  to state that she was in hospital) Current Attention Level: Selective Memory: Decreased short-term memory (no recall of education, even with immediate recall) Following Commands: Follows one step commands with increased time Safety/Judgement: Decreased awareness of  safety;Decreased awareness of deficits Awareness: Emergent Problem Solving: Slow processing;Decreased initiation;Difficulty sequencing;Requires verbal cues General Comments: pt slow to process but following commands with increased time        Exercises     Shoulder Instructions       General Comments      Pertinent Vitals/ Pain       Pain Assessment: No/denies pain  Home Living                                          Prior Functioning/Environment              Frequency  Min 2X/week        Progress Toward Goals  OT Goals(current goals can now be found in the care plan section)  Progress towards OT goals: Progressing toward goals  Acute Rehab OT Goals Patient Stated Goal: to get to [facility] OT Goal Formulation: With patient Time For Goal Achievement: 12/17/20 Potential to Achieve Goals: Good  Plan Discharge plan remains appropriate;Frequency remains appropriate    Co-evaluation                 AM-PAC OT "6 Clicks" Daily Activity     Outcome Measure   Help from another person eating meals?: None Help from another person taking care of personal grooming?: A Little Help from another person toileting, which includes using toliet, bedpan, or urinal?: A Little Help from another person bathing (including washing, rinsing, drying)?: A Little Help from another person to put on and taking off regular upper body clothing?: A Little Help from another person to put on and taking off regular lower body clothing?: A Lot 6 Click Score: 18    End of Session Equipment Utilized During Treatment: Gait belt;Rolling walker  OT Visit Diagnosis: Unsteadiness on feet (R26.81);Muscle weakness (generalized) (M62.81);Other symptoms and signs involving cognitive function   Activity Tolerance Patient tolerated treatment well   Patient Left in chair;with call bell/phone within reach;with chair alarm set   Nurse Communication Mobility status        Time:  1329-1350 OT Time Calculation (min): 21 min  Charges: OT General Charges $OT Visit: 1 Visit OT Treatments $Self Care/Home Management : 8-22 mins  Harley Alto., COTA/L Acute Rehabilitation Services [phone]    Precious Haws 12/15/2020, 2:37 PM

## 2020-12-15 NOTE — Progress Notes (Addendum)
[facility] Progress Note   Subjective: Seen in room still only oriented to person but less agitated today.   Objective Vitals:   12/14/20 1225 12/14/20 1900 12/15/20 0500 12/15/20 0559  BP: (!) 147/39 (!) 120/39  (!) 127/34  Pulse: 69 67  71  Resp:  18  17  Temp:  98.2 F (36.8 C)  98.1 F (36.7 C)  TempSrc:  Oral  Oral  SpO2:  99%  100%  Weight:   53.5 kg    Physical Exam General: Chronically ill elderly female in NAD very agitated.  Heart: S1,S2 RRR No R/G Lungs:CTAB Abdomen: S, NT, ND Extremities: No LE edema Dialysis Access:. AVF +T/B   Additional Objective Labs: Basic Metabolic Panel: Recent Labs  Lab 12/10/20 0046 12/11/20 0427 12/14/20 0146 12/15/20 0157  NA 137 139 136 136  K 3.7 4.3 4.2 3.9  CL 97* 101 97* 99  CO2 33* 28 28 27   GLUCOSE 117* 86 86 84  BUN 11 24* 42* 15  CREATININE 4.04* 6.91* 9.47* 4.77*  CALCIUM 11.3* 11.1* 9.7 9.0  PHOS 3.8 5.1*  --   --    Liver Function Tests: Recent Labs  Lab 12/11/20 0427 12/14/20 0146 12/15/20 0157  AST 21 20 23   ALT 20 16 19   ALKPHOS 40 39 41  BILITOT 0.6 0.6 0.6  PROT 5.2* 5.4* 5.5*  ALBUMIN 2.4* 2.4* 2.5*   No results for input(s): LIPASE, AMYLASE in the last 168 hours. CBC: Recent Labs  Lab 12/09/20 0808 12/10/20 0046 12/11/20 0427 12/12/20 1036 12/14/20 0146 12/15/20 0157  WBC 9.6 9.4 7.5  --  7.1 6.2  NEUTROABS 6.8 6.7 5.1  --  4.6 4.0  HGB 7.4* 7.3* 6.2* 8.6* 7.8* 7.9*  HCT 24.7* 24.0* 20.1* 27.3* 25.5* 25.7*  MCV 115.4* 115.9* 116.2*  --  109.0* 108.9*  PLT 355 376 307  --  315 329   Blood Culture    Component Value Date/Time   SDES BLOOD RIGHT ARM 10/10/2016 0124   SPECREQUEST IN PEDIATRIC BOTTLE Blood Culture adequate volume 10/10/2016 0124   CULT NO GROWTH 5 DAYS 10/10/2016 0124   REPTSTATUS 10/15/2016 FINAL 10/10/2016 0124    Cardiac Enzymes: No results for input(s): CKTOTAL, CKMB, CKMBINDEX, TROPONINI in the last 168 hours. CBG: No results for  input(s): GLUCAP in the last 168 hours. Iron Studies: No results for input(s): IRON, TIBC, TRANSFERRIN, FERRITIN in the last 72 hours. @lablastinr3 @ Studies/Results: No results found. Medications:  . sodium chloride   Intravenous Once  . aspirin EC  81 mg Oral Daily  . Chlorhexidine Gluconate Cloth  6 each Topical Q0600  . darbepoetin (ARANESP) injection - DIALYSIS  60 mcg Intravenous Q Mon-HD  . isosorbide mononitrate  30 mg Oral Daily  . mouth rinse  15 mL Mouth Rinse BID  . sevelamer carbonate  1,600 mg Oral TID WC     [facility]: [facility] EDW61kgHD Bath 2K/2.5CaTime 3.5 hrsHeparin none. AccessLUE AVFBFR Nipro Cellentia 17h Blood [id number]  Hectoral 4mcg IV/HD  Epogen 8000Units IV/HDgiven 12/02/20  Assessment/Plan: 1. ESRD- continue MWF schedule. Next HD 6/8.  2. Hypertension/volume-h/ononcomplaincewith medications and came in w/ hypertensive urgency. BP still moderately elevated but reportedly had possible syncope on HD Wed. She is now below her previous outpatient EDW with no volume excess on exam. Back on imdur. Many med intolerances including amlodipine and hydralazine. Also did not tolerate ranexa and had hypotension with beta blocker per cardiology notes. Could consider trial of very low  dose beta blocker if BP remains elevated. Very much under OP EDW. Will need to lower on DC 3. Anemia-Hgb 6.2 on 6/3 Transfused 1 unit prbcs --improved to 7.9. No bleeding reported.Started aranesp 45 with HD on 12/07/20.(previously on epogen). Tsat 20% but has Fe allergy in chart.  4. Metabolic bone disease/Hypercalcemia. Corrected calcium elevated despite low calcium bath and holding VDRA. Unclear cause-suspect immobility most likely.PTH 69. Hypercalcemia may be contributing to her confusion. S/p pamidronate 60mg  on 6/2, calcium levels are improving now.Follow labs.  5. Weakness-neurology on board; suspecting  statin but she has also been on for many years. Recent admission for weakness which improved with discontinuation of gabapentin + RRT. She feels the weakness is much better. Neuro recommendscont holding statin, outpatient EMG and neuropsych eval. 6. Nutrition-renal diet 7. H/o myoclonus - Do not resume gabapentin as likely source of myoclonus and leg weakness-> improved after gabapentin was stopped last admission + RRT. 8. DM type 2 - per primary 9. Vascular access - have had issues with avf inpastbut working well here so far. 10. Dispo: Awaiting SNFplacementin next few days.   Dmauri Rosenow H. Zakeria Kulzer NP-C 12/15/2020, 10:36 AM  [employer] [phone]

## 2020-12-15 NOTE — Progress Notes (Signed)
PROGRESS NOTE        PATIENT DETAILS Name: Shawna [name] Age: 80 y.o. Sex: female Date of Birth: 1941-06-26 Admit Date: 12/02/2020 Admitting Physician Rhetta Mura, DO FAO:[id number], [facility]  Brief Narrative: Patient is a 80 y.o. female ESRD on HD, PAF, chronic diastolic heart failure, HTN, HLD who presented with worsening lower extremity weakness-with concerns for myositis on admission.  She underwent neurology eval-work-up was negative for myositis.  Hospital course complicated by acute metabolic encephalopathy, hypercalcemia and worsening anemia.  See below for further details.  Significant studies: 5/25>> CT head: No acute intracranial pathology. 5/25>> MRI brain: No acute intracranial abnormality. 5/26>> MRI left femur: No acute abnormality 5/26>> CXR: Resolution of pulm edema, left basilar scarring. 5/26>> SPEP: No M spike 5/26>> TSH: 9.19 5/26>> CK: 47 5/28>> x-ray left ankle: No fracture/dislocation  Antimicrobial therapy: None  Microbiology data: 5/25>> COVID PCR: Negative  Procedures : None  Consults: Neurology, nephrology  DVT Prophylaxis : SCDs Start: 12/02/20 2349   Subjective:  Patient in bed, appears comfortable, denies any headache, no fever, no chest pain or pressure, no shortness of breath , no abdominal pain. No new focal weakness.  Assessment/Plan:  Bilateral lower extremity weakness: Neuroimaging as above-evaluated by neurology with recommendations for outpatient EMG/neuropsych evaluation.  CK within normal limits.  Needs SNF on discharge.  Able to lift both lower extremities off the bed against mild resistance.  Acute metabolic encephalopathy: MRI brain negative-suspect this is probably mild metabolic encephalopathy in the setting of hypercalcemia.  Seems somewhat better today-although no family at bedside-but answering simple questions appropriately.  Hypercalcemia: S/p pamidronate on 6/2-nephrology  following and directing care.  PAF: Maintaining sinus rhythm-prior MD discussed with cardiology-amiodarone was discontinued in the setting of mildly elevated TSH.  Not a candidate for anticoagulation due to recurrent GI bleeding.    Elevated TSH with mildly elevated free T4.  Question if this is secondary hypothyroidism due to pituitary overactivity.  We will repeat numbers, may require outpatient endocrine follow-up.     Lab Results  Component Value Date   TSH 16.122 (H) 12/15/2020    ESRD on HD: Nephrology following and directing inpatient HD care.  Syncope: Occurred on 6/1-in the setting of hypotension during HD.  Continue close monitoring.  HTN: BP reasonable-continue Imdur  Chronic diastolic heart failure: Volume management with HD.  Normocytic anemia: Due to ESRD-worsened by acute illness-no GI bleeding apparent.  Being transfused PRBC today.  Repeat CBC in AM.  Left ankle pain: Imaging negative-supportive care continues.Improved.   Diet: Diet Order            Diet renal with fluid restriction Fluid restriction: 1800 mL Fluid; Room service appropriate? No; Fluid consistency: Thin  Diet effective now                  Code Status: Full code   Family Communication: Spouse-Nathaniel-[phone]-called on 6/3-unable to leave voicemail.  Disposition Plan: Status is: Inpatient  Remains inpatient appropriate because:Inpatient level of care appropriate due to severity of illness   Dispo: The patient is from: Home              Anticipated d/c is to: SNF              Patient currently is not medically stable to d/c.   Difficult to place patient No  Barriers to Discharge: Resolving encephalopathy-worsening anemia-being transfused 1 unit of PRBC with hemodialysis today.  Antimicrobial agents: Anti-infectives (From admission, onward)   None       Time spent: 35 minutes-Greater than 50% of this time was spent in counseling, explanation of diagnosis, planning  of further management, and coordination of care.  MEDICATIONS: Scheduled Meds: . sodium chloride   Intravenous Once  . aspirin EC  81 mg Oral Daily  . Chlorhexidine Gluconate Cloth  6 each Topical Q0600  . darbepoetin (ARANESP) injection - DIALYSIS  60 mcg Intravenous Q Mon-HD  . isosorbide mononitrate  30 mg Oral Daily  . mouth rinse  15 mL Mouth Rinse BID  . sevelamer carbonate  1,600 mg Oral TID WC   Continuous Infusions: PRN Meds:.benzonatate, haloperidol lactate, hydrALAZINE, hydrOXYzine   PHYSICAL EXAM: Vital signs: Vitals:   12/14/20 1225 12/14/20 1900 12/15/20 0500 12/15/20 0559  BP: (!) 147/39 (!) 120/39  (!) 127/34  Pulse: 69 67  71  Resp:  18  17  Temp:  98.2 F (36.8 C)  98.1 F (36.7 C)  TempSrc:  Oral  Oral  SpO2:  99%  100%  Weight:   53.5 kg    Filed Weights   12/14/20 0821 12/14/20 1218 12/15/20 0500  Weight: 54.2 kg 52.8 kg 53.5 kg   Body mass index is 22.29 kg/m.   Gen Exam:  Awake in no distress but pleasantly confused, No new F.N deficits,   [place].AT,PERRAL Supple Neck,No JVD, No cervical lymphadenopathy appriciated.  Symmetrical Chest wall movement, Good air movement bilaterally, CTAB RRR,No Gallops, Rubs or new Murmurs, No Parasternal Heave +ve B.Sounds, Abd Soft, No tenderness, No organomegaly appriciated, No rebound - guarding or rigidity. No Cyanosis, Clubbing or edema, No new Rash or bruise  I have personally reviewed following labs and imaging studies  LABORATORY DATA: CBC: Recent Labs  Lab 12/09/20 0808 12/10/20 0046 12/11/20 0427 12/12/20 1036 12/14/20 0146 12/15/20 0157  WBC 9.6 9.4 7.5  --  7.1 6.2  NEUTROABS 6.8 6.7 5.1  --  4.6 4.0  HGB 7.4* 7.3* 6.2* 8.6* 7.8* 7.9*  HCT 24.7* 24.0* 20.1* 27.3* 25.5* 25.7*  MCV 115.4* 115.9* 116.2*  --  109.0* 108.9*  PLT 355 376 307  --  315 528    Basic Metabolic Panel: Recent Labs  Lab 12/09/20 0808 12/10/20 0046 12/11/20 0427 12/14/20 0146 12/15/20 0157  NA 139 137 139 136  136  K 4.8 3.7 4.3 4.2 3.9  CL 100 97* 101 97* 99  CO2 30 33* 28 28 27   GLUCOSE 138* 117* 86 86 84  BUN 35* 11 24* 42* 15  CREATININE 7.99* 4.04* 6.91* 9.47* 4.77*  CALCIUM 12.3* 11.3* 11.1* 9.7 9.0  MG  --  2.0 2.0 2.1 1.8  PHOS  --  3.8 5.1*  --   --     GFR: Estimated Creatinine Clearance: 7.1 mL/min (A) (by C-G formula based on SCr of 4.77 mg/dL (H)).  Liver Function Tests: Recent Labs  Lab 12/09/20 0808 12/10/20 0046 12/11/20 0427 12/14/20 0146 12/15/20 0157  AST 29 30 21 20 23   ALT 25 25 20 16 19   ALKPHOS 44 49 40 39 41  BILITOT 0.6 0.7 0.6 0.6 0.6  PROT 5.8* 6.6 5.2* 5.4* 5.5*  ALBUMIN 2.6* 2.9* 2.4* 2.4* 2.5*   No results for input(s): LIPASE, AMYLASE in the last 168 hours. Recent Labs  Lab 12/11/20 0427  AMMONIA 27    Coagulation Profile: No results for input(s):  INR, PROTIME in the last 168 hours.  Cardiac Enzymes: No results for input(s): CKTOTAL, CKMB, CKMBINDEX, TROPONINI in the last 168 hours.  BNP (last 3 results) No results for input(s): PROBNP in the last 8760 hours.  Lipid Profile: No results for input(s): CHOL, HDL, LDLCALC, TRIG, CHOLHDL, LDLDIRECT in the last 72 hours.  Thyroid Function Tests: Recent Labs    12/15/20 0157  TSH 16.122*  FREET4 1.14*    Anemia Panel: No results for input(s): VITAMINB12, FOLATE, FERRITIN, TIBC, IRON, RETICCTPCT in the last 72 hours.  Urine analysis:    Component Value Date/Time   COLORURINE YELLOW 12/16/2012 1919   APPEARANCEUR CLOUDY (A) 12/16/2012 1919   LABSPEC 1.009 12/16/2012 1919   PHURINE 7.5 12/16/2012 1919   GLUCOSEU NEGATIVE 12/16/2012 1919   HGBUR TRACE (A) 12/16/2012 1919   BILIRUBINUR NEGATIVE 12/16/2012 1919   KETONESUR NEGATIVE 12/16/2012 1919   PROTEINUR 100 (A) 12/16/2012 1919   UROBILINOGEN 0.2 12/16/2012 1919   NITRITE NEGATIVE 12/16/2012 1919   LEUKOCYTESUR SMALL (A) 12/16/2012 1919    Sepsis Labs: Lactic Acid, Venous    Component Value Date/Time   LATICACIDVEN 1.84  03/22/2017 0615    MICROBIOLOGY: No results found for this or any previous visit (from the past 240 hour(s)).  RADIOLOGY STUDIES/RESULTS: No results found.   LOS: 13 days   Signature  Lala Lund M.D on 12/15/2020 at 10:32 AM   -  To page go to [email or web address]

## 2020-12-15 NOTE — Care Management Important Message (Signed)
Important Message  Patient Details  Name: Shawna [name] MRN: [id number] Date of Birth: 1941/04/24   Medicare Important Message Given:  Yes - Important Message mailed due to current National Emergency   Verbal consent obtained due to current National Emergency  Relationship to patient: Self Contact Name: Icel Call Date: 12/15/20  Time: 1354 Phone: [phone] Outcome: Spoke with contact Important Message mailed to: Patient address on file    [name] 12/15/2020, 1:54 PM

## 2020-12-16 LAB — RENAL FUNCTION PANEL
Albumin: 2.5 g/dL — ABNORMAL LOW (ref 3.5–5.0)
Anion gap: 9 (ref 5–15)
BUN: 31 mg/dL — ABNORMAL HIGH (ref 8–23)
CO2: 28 mmol/L (ref 22–32)
Calcium: 8.7 mg/dL — ABNORMAL LOW (ref 8.9–10.3)
Chloride: 101 mmol/L (ref 98–111)
Creatinine, Ser: 8.01 mg/dL — ABNORMAL HIGH (ref 0.44–1.00)
GFR, Estimated: 5 mL/min — ABNORMAL LOW (ref >60)
Glucose, Bld: 80 mg/dL (ref 70–99)
Phosphorus: 2.7 mg/dL (ref 2.5–4.6)
Potassium: 3.9 mmol/L (ref 3.5–5.1)
Sodium: 138 mmol/L (ref 135–145)

## 2020-12-16 LAB — CBC WITH DIFFERENTIAL/PLATELET
Abs Immature Granulocytes: 0.03 10*3/uL (ref 0.00–0.07)
Basophils Absolute: 0.1 10*3/uL (ref 0.0–0.1)
Basophils Relative: 1 %
Eosinophils Absolute: 0.7 10*3/uL — ABNORMAL HIGH (ref 0.0–0.5)
Eosinophils Relative: 10 %
HCT: 24.9 % — ABNORMAL LOW (ref 36.0–46.0)
Hemoglobin: 7.7 g/dL — ABNORMAL LOW (ref 12.0–15.0)
Immature Granulocytes: 0 %
Lymphocytes Relative: 13 %
Lymphs Abs: 1 10*3/uL (ref 0.7–4.0)
MCH: 34.1 pg — ABNORMAL HIGH (ref 26.0–34.0)
MCHC: 30.9 g/dL (ref 30.0–36.0)
MCV: 110.2 fL — ABNORMAL HIGH (ref 80.0–100.0)
Monocytes Absolute: 0.7 10*3/uL (ref 0.1–1.0)
Monocytes Relative: 9 %
Neutro Abs: 4.7 10*3/uL (ref 1.7–7.7)
Neutrophils Relative %: 67 %
Platelets: 334 10*3/uL (ref 150–400)
RBC: 2.26 MIL/uL — ABNORMAL LOW (ref 3.87–5.11)
RDW: 19.5 % — ABNORMAL HIGH (ref 11.5–15.5)
WBC: 7.1 10*3/uL (ref 4.0–10.5)
nRBC: 0 % (ref 0.0–0.2)

## 2020-12-16 LAB — T4, FREE: Free T4: 1.08 ng/dL (ref 0.61–1.12)

## 2020-12-16 LAB — T3: T3, Total: 249 ng/dL — ABNORMAL HIGH (ref 71–180)

## 2020-12-16 LAB — TSH: TSH: 5.174 u[IU]/mL — ABNORMAL HIGH (ref 0.350–4.500)

## 2020-12-16 LAB — MAGNESIUM: Magnesium: 2.1 mg/dL (ref 1.7–2.4)

## 2020-12-16 MED ORDER — SODIUM CHLORIDE 0.9 % IV SOLN: 100.0000 mL | INTRAVENOUS | Status: DC | PRN

## 2020-12-16 MED ORDER — LIDOCAINE HCL (PF) 1 % IJ SOLN
5.0000 mL | INTRAMUSCULAR | Status: DC | PRN
  Filled 2020-12-16: qty 5

## 2020-12-16 MED ORDER — ALTEPLASE 2 MG IJ SOLR: 2.0000 mg | Freq: Once | INTRAMUSCULAR | Status: DC | PRN

## 2020-12-16 MED ORDER — METOPROLOL TARTRATE 25 MG PO TABS
25.0000 mg | ORAL_TABLET | Freq: Two times a day (BID) | ORAL | Status: DC
  Administered 2020-12-17: 25 mg via ORAL
  Filled 2020-12-16: qty 1

## 2020-12-16 MED ORDER — PENTAFLUOROPROP-TETRAFLUOROETH EX AERO: 1.0000 "application " | INHALATION_SPRAY | CUTANEOUS | Status: DC | PRN

## 2020-12-16 MED ORDER — LIDOCAINE-PRILOCAINE 2.5-2.5 % EX CREA
1.0000 "application " | TOPICAL_CREAM | CUTANEOUS | Status: DC | PRN
  Filled 2020-12-16: qty 5

## 2020-12-16 NOTE — Progress Notes (Addendum)
PROGRESS NOTE        PATIENT DETAILS Name: Shawna [name] Age: 80 y.o. Sex: female Date of Birth: 08/05/1940 Admit Date: 12/02/2020 Admitting Physician Rhetta Mura, DO XNA:[id number], [facility]  Brief Narrative: Patient is a 80 y.o. female ESRD on HD, PAF, chronic diastolic heart failure, HTN, HLD who presented with worsening lower extremity weakness-with concerns for myositis on admission.  She underwent neurology eval-work-up was negative for myositis.  Hospital course complicated by acute metabolic encephalopathy, hypercalcemia and worsening anemia.  See below for further details.  Significant studies: 5/25>> CT head: No acute intracranial pathology. 5/25>> MRI brain: No acute intracranial abnormality. 5/26>> MRI left femur: No acute abnormality 5/26>> CXR: Resolution of pulm edema, left basilar scarring. 5/26>> SPEP: No M spike 5/26>> TSH: 9.19 5/26>> CK: 47 5/28>> x-ray left ankle: No fracture/dislocation  Antimicrobial therapy: None  Microbiology data: 5/25>> COVID PCR: Negative  Procedures : None  Consults: Neurology, nephrology  DVT Prophylaxis : SCDs Start: 12/02/20 2349   Subjective:  Patient in bed, appears comfortable, denies any headache, no fever, no chest pain or pressure, no shortness of breath , no abdominal pain. No new focal weakness.   Assessment/Plan:  Bilateral lower extremity weakness: Neuroimaging as above-evaluated by neurology with recommendations for outpatient EMG/neuropsych evaluation.  CK within normal limits.  Needs SNF on discharge.  Able to lift both lower extremities off the bed against mild resistance, overall much better, await SNF bed.  Acute metabolic encephalopathy: MRI brain negative-suspect this is probably mild metabolic encephalopathy in the setting of hypercalcemia.  Seems somewhat better today-although no family at bedside-but answering simple questions appropriately.  Hypercalcemia: S/p  pamidronate on 6/2-nephrology following and directing care.  PAF: Maintaining sinus rhythm-prior MD discussed with cardiology-amiodarone was discontinued in the setting of mildly elevated TSH.  Not a candidate for anticoagulation due to recurrent GI bleeding.    Elevated TSH with mildly elevated free T4.  Question if this is secondary hypothyroidism due to pituitary overactivity.  We will repeat numbers, may require outpatient endocrine follow-up.     Lab Results  Component Value Date   TSH 16.122 (H) 12/15/2020    ESRD on HD: MWF HD, Nephrology following and directing inpatient HD care.  Syncope: Occurred on 6/1-in the setting of hypotension during HD.  Continue close monitoring.  HTN: BP reasonable-continue Imdur  Chronic diastolic heart failure: Volume management with HD.  Normocytic anemia: Due to ESRD-worsened by acute illness-no GI bleeding apparent.  Post 1 unit this admission, now stable H&H, no signs of ongoing bleeding..  Left ankle pain: Imaging negative-supportive care, resolved.   Diet: Diet Order            Diet renal with fluid restriction Fluid restriction: 1800 mL Fluid; Room service appropriate? No; Fluid consistency: Thin  Diet effective now                  Code Status: Full code   Family Communication:  Spouse-Nathaniel-[phone]-called on 6/3-unable to leave voicemail.  Disposition Plan:  Status is: Inpatient  Remains inpatient appropriate because:Inpatient level of care appropriate due to severity of illness   Dispo: The patient is from: Home              Anticipated d/c is to: SNF              Patient currently  is not medically stable to d/c.   Difficult to place patient No     Barriers to Discharge: Resolving encephalopathy-worsening anemia-being transfused 1 unit of PRBC with hemodialysis today.  Antimicrobial agents: Anti-infectives (From admission, onward)   None       Time spent: 35 minutes-Greater than 50% of this time  was spent in counseling, explanation of diagnosis, planning of further management, and coordination of care.  MEDICATIONS: Scheduled Meds: . aspirin EC  81 mg Oral Daily  . Chlorhexidine Gluconate Cloth  6 each Topical Q0600  . darbepoetin (ARANESP) injection - DIALYSIS  60 mcg Intravenous Q Mon-HD  . isosorbide mononitrate  30 mg Oral Daily  . mouth rinse  15 mL Mouth Rinse BID  . metoprolol tartrate  25 mg Oral BID  . sevelamer carbonate  1,600 mg Oral TID WC   Continuous Infusions: . sodium chloride     PRN Meds:.sodium chloride, alteplase, benzonatate, haloperidol lactate, hydrOXYzine, lidocaine (PF), lidocaine-prilocaine, pentafluoroprop-tetrafluoroeth   PHYSICAL EXAM: Vital signs: Vitals:   12/16/20 0925 12/16/20 0928 12/16/20 1000 12/16/20 1030  BP: (!) 131/45 (!) 132/48 (!) 122/43 (!) 129/41  Pulse: 65 66 66 68  Resp:      Temp:      TempSrc:      SpO2:      Weight:       Filed Weights   12/14/20 1218 12/15/20 0500 12/16/20 0920  Weight: 52.8 kg 53.5 kg 52.8 kg   Body mass index is 21.99 kg/m.   Gen Exam:  Awake in no distress but pleasantly confused, No new F.N deficits,   [place].AT,PERRAL Supple Neck,No JVD, No cervical lymphadenopathy appriciated.  Symmetrical Chest wall movement, Good air movement bilaterally, CTAB RRR,No Gallops, Rubs or new Murmurs, No Parasternal Heave +ve B.Sounds, Abd Soft, No tenderness, No organomegaly appriciated, No rebound - guarding or rigidity. No Cyanosis, Clubbing or edema, No new Rash or bruise   I have personally reviewed following labs and imaging studies  LABORATORY DATA: CBC: Recent Labs  Lab 12/10/20 0046 12/11/20 0427 12/12/20 1036 12/14/20 0146 12/15/20 0157 12/16/20 0949  WBC 9.4 7.5  --  7.1 6.2 7.1  NEUTROABS 6.7 5.1  --  4.6 4.0 PENDING  HGB 7.3* 6.2* 8.6* 7.8* 7.9* 7.7*  HCT 24.0* 20.1* 27.3* 25.5* 25.7* 24.9*  MCV 115.9* 116.2*  --  109.0* 108.9* 110.2*  PLT 376 307  --  315 329 334    Basic  Metabolic Panel: Recent Labs  Lab 12/10/20 0046 12/11/20 0427 12/14/20 0146 12/15/20 0157  NA 137 139 136 136  K 3.7 4.3 4.2 3.9  CL 97* 101 97* 99  CO2 33* 28 28 27   GLUCOSE 117* 86 86 84  BUN 11 24* 42* 15  CREATININE 4.04* 6.91* 9.47* 4.77*  CALCIUM 11.3* 11.1* 9.7 9.0  MG 2.0 2.0 2.1 1.8  PHOS 3.8 5.1*  --  2.5    GFR: Estimated Creatinine Clearance: 7.1 mL/min (A) (by C-G formula based on SCr of 4.77 mg/dL (H)).  Liver Function Tests: Recent Labs  Lab 12/10/20 0046 12/11/20 0427 12/14/20 0146 12/15/20 0157  AST 30 21 20 23   ALT 25 20 16 19   ALKPHOS 49 40 39 41  BILITOT 0.7 0.6 0.6 0.6  PROT 6.6 5.2* 5.4* 5.5*  ALBUMIN 2.9* 2.4* 2.4* 2.5*   No results for input(s): LIPASE, AMYLASE in the last 168 hours. Recent Labs  Lab 12/11/20 0427  AMMONIA 27    Coagulation Profile: No results for input(s):  INR, PROTIME in the last 168 hours.  Cardiac Enzymes: No results for input(s): CKTOTAL, CKMB, CKMBINDEX, TROPONINI in the last 168 hours.  BNP (last 3 results) No results for input(s): PROBNP in the last 8760 hours.  Lipid Profile: No results for input(s): CHOL, HDL, LDLCALC, TRIG, CHOLHDL, LDLDIRECT in the last 72 hours.  Thyroid Function Tests: Recent Labs    12/15/20 0157  TSH 16.122*  FREET4 1.14*    Anemia Panel: No results for input(s): VITAMINB12, FOLATE, FERRITIN, TIBC, IRON, RETICCTPCT in the last 72 hours.  Urine analysis:    Component Value Date/Time   COLORURINE YELLOW 12/16/2012 1919   APPEARANCEUR CLOUDY (A) 12/16/2012 1919   LABSPEC 1.009 12/16/2012 1919   PHURINE 7.5 12/16/2012 1919   GLUCOSEU NEGATIVE 12/16/2012 1919   HGBUR TRACE (A) 12/16/2012 1919   BILIRUBINUR NEGATIVE 12/16/2012 1919   KETONESUR NEGATIVE 12/16/2012 1919   PROTEINUR 100 (A) 12/16/2012 1919   UROBILINOGEN 0.2 12/16/2012 1919   NITRITE NEGATIVE 12/16/2012 1919   LEUKOCYTESUR SMALL (A) 12/16/2012 1919    Sepsis Labs: Lactic Acid, Venous    Component  Value Date/Time   LATICACIDVEN 1.84 03/22/2017 0615    MICROBIOLOGY: No results found for this or any previous visit (from the past 240 hour(s)).  RADIOLOGY STUDIES/RESULTS: No results found.   LOS: 14 days   Signature  Lala Lund M.D on 12/16/2020 at 10:37 AM   -  To page go to [email or web address]

## 2020-12-16 NOTE — Discharge Instructions (Addendum)
Follow with Primary MD Practice, [facility] in 7 days   Get CBC, CMP, TSH, Free T4, T3, 2 view Chest X ray -  checked next visit within 1 week by Primary MD or SNF MD   Activity: As tolerated with Full fall precautions use walker/cane & assistance as needed  Disposition SNF  Diet: Renal-Low carbohydrate diet with 1.5 L fluid restriction per day, check CBGs QAC-HS   Special Instructions: If you have smoked or chewed Tobacco  in the last 2 yrs please stop smoking, stop any regular Alcohol  and or any Recreational drug use.  On your next visit with your primary care physician please Get Medicines reviewed and adjusted.  Please request your Prim.MD to go over all Hospital Tests and Procedure/Radiological results at the follow up, please get all Hospital records sent to your Prim MD by signing hospital release before you go home.  If you experience worsening of your admission symptoms, develop shortness of breath, life threatening emergency, suicidal or homicidal thoughts you must seek medical attention immediately by calling 911 or calling your MD immediately  if symptoms less severe.  You Must read complete instructions/literature along with all the possible adverse reactions/side effects for all the Medicines you take and that have been prescribed to you. Take any new Medicines after you have completely understood and accpet all the possible adverse reactions/side effects.

## 2020-12-16 NOTE — Progress Notes (Signed)
[facility] Progress Note   Subjective: Calm today, oriented to person and place. Answering questions appropriately. On HD minimal volume removal today. BP controlled. No C/Os.   Objective Vitals:   12/16/20 0920 12/16/20 0925 12/16/20 0928 12/16/20 1000  BP: (!) 129/41 (!) 131/45 (!) 132/48 (!) 122/43  Pulse: 66 65 66 66  Resp: 18     Temp: 98.4 F (36.9 C)     TempSrc: Oral     SpO2: 95%     Weight: 52.8 kg      Physical Exam General:Chronically ill elderly female in NADvery agitated. Heart:S1,S2 RRR No R/G Lungs:CTAB Abdomen:S, NT, ND Extremities:No LE edema Dialysis Access:. AVF +T/B    Additional Objective Labs: Basic Metabolic Panel: Recent Labs  Lab 12/10/20 0046 12/11/20 0427 12/14/20 0146 12/15/20 0157  NA 137 139 136 136  K 3.7 4.3 4.2 3.9  CL 97* 101 97* 99  CO2 33* 28 28 27   GLUCOSE 117* 86 86 84  BUN 11 24* 42* 15  CREATININE 4.04* 6.91* 9.47* 4.77*  CALCIUM 11.3* 11.1* 9.7 9.0  PHOS 3.8 5.1*  --  2.5   Liver Function Tests: Recent Labs  Lab 12/11/20 0427 12/14/20 0146 12/15/20 0157  AST 21 20 23   ALT 20 16 19   ALKPHOS 40 39 41  BILITOT 0.6 0.6 0.6  PROT 5.2* 5.4* 5.5*  ALBUMIN 2.4* 2.4* 2.5*   No results for input(s): LIPASE, AMYLASE in the last 168 hours. CBC: Recent Labs  Lab 12/10/20 0046 12/11/20 0427 12/12/20 1036 12/14/20 0146 12/15/20 0157  WBC 9.4 7.5  --  7.1 6.2  NEUTROABS 6.7 5.1  --  4.6 4.0  HGB 7.3* 6.2* 8.6* 7.8* 7.9*  HCT 24.0* 20.1* 27.3* 25.5* 25.7*  MCV 115.9* 116.2*  --  109.0* 108.9*  PLT 376 307  --  315 329   Blood Culture    Component Value Date/Time   SDES BLOOD RIGHT ARM 10/10/2016 0124   SPECREQUEST IN PEDIATRIC BOTTLE Blood Culture adequate volume 10/10/2016 0124   CULT NO GROWTH 5 DAYS 10/10/2016 0124   REPTSTATUS 10/15/2016 FINAL 10/10/2016 0124    Cardiac Enzymes: No results for input(s): CKTOTAL, CKMB, CKMBINDEX, TROPONINI in the last 168 hours. CBG: No results  for input(s): GLUCAP in the last 168 hours. Iron Studies: No results for input(s): IRON, TIBC, TRANSFERRIN, FERRITIN in the last 72 hours. @lablastinr3 @ Studies/Results: No results found. Medications: . sodium chloride     . aspirin EC  81 mg Oral Daily  . Chlorhexidine Gluconate Cloth  6 each Topical Q0600  . darbepoetin (ARANESP) injection - DIALYSIS  60 mcg Intravenous Q Mon-HD  . isosorbide mononitrate  30 mg Oral Daily  . mouth rinse  15 mL Mouth Rinse BID  . metoprolol tartrate  25 mg Oral BID  . sevelamer carbonate  1,600 mg Oral TID WC     [facility] EDW61kgHD Bath 2K/2.5CaTime 3.5 hrsHeparin none. AccessLUE AVFBFR Nipro Cellentia 17h Blood [id number]  Hectoral 55mcg IV/HD  Epogen 8000Units IV/HDgiven 12/02/20  Assessment/Plan: 1. ESRD- continue MWF schedule. Next HD 6/8.  2. Hypertension/volume-h/ononcomplaincewith medications and came in w/ hypertensive urgency. BP still moderately elevated but reportedly had possible syncope on HD Wed. She is now below her previous outpatient EDW with no volume excess on exam. Back on imdur. Many med intolerances including amlodipine and hydralazine. Also did not tolerate ranexa and had hypotension with beta blocker per cardiology notes. Could consider trial of very low dose beta  blocker if BP remains elevated. Very much under OP EDW. Will need to lower on DC 3. Anemia-Hgb 6.2 on 6/3 Transfused 1 unit prbcs --improved to 7.9. No bleeding reported.Started aranesp 75 with HD on 12/07/20.(previously on epogen). Tsat 20% but has Fe allergy in chart.  4. Metabolic bone disease/Hypercalcemia. Corrected calcium elevated despite low calcium bath and holding VDRA. Unclear cause-suspect immobility most likely.PTH 69. Hypercalcemia may be contributing to her confusion. S/p pamidronate 60mg  on 6/2, calcium levels are improving now.Follow labs.  5. Weakness-neurology  on board; suspecting statin but she has also been on for many years. Recent admission for weakness which improved with discontinuation of gabapentin + RRT. She feels the weakness is much better. Neuro recommendscont holding statin, outpatient EMG and neuropsych eval. 6. Nutrition-renal diet 7. H/o myoclonus - Do not resume gabapentin as likely source of myoclonus and leg weakness-> improved after gabapentin was stopped last admission + RRT. 8. DM type 2 - per primary 9. Vascular access - have had issues with avf inpastbut working well here so far. 10. Dispo: Awaiting SNFplacementin next few days.   Zenita Kister H. Jerlyn Pain NP-C 12/16/2020, 10:09 AM  [employer] [phone]

## 2020-12-17 LAB — COMPREHENSIVE METABOLIC PANEL
ALT: 18 U/L (ref 0–44)
AST: 26 U/L (ref 15–41)
Albumin: 2.8 g/dL — ABNORMAL LOW (ref 3.5–5.0)
Alkaline Phosphatase: 46 U/L (ref 38–126)
Anion gap: 11 (ref 5–15)
BUN: 13 mg/dL (ref 8–23)
CO2: 28 mmol/L (ref 22–32)
Calcium: 8.6 mg/dL — ABNORMAL LOW (ref 8.9–10.3)
Chloride: 96 mmol/L — ABNORMAL LOW (ref 98–111)
Creatinine, Ser: 4.72 mg/dL — ABNORMAL HIGH (ref 0.44–1.00)
GFR, Estimated: 9 mL/min — ABNORMAL LOW (ref >60)
Glucose, Bld: 107 mg/dL — ABNORMAL HIGH (ref 70–99)
Potassium: 3.9 mmol/L (ref 3.5–5.1)
Sodium: 135 mmol/L (ref 135–145)
Total Bilirubin: 0.3 mg/dL (ref 0.3–1.2)
Total Protein: 6.1 g/dL — ABNORMAL LOW (ref 6.5–8.1)

## 2020-12-17 LAB — MAGNESIUM: Magnesium: 1.9 mg/dL (ref 1.7–2.4)

## 2020-12-17 LAB — CBC WITH DIFFERENTIAL/PLATELET
Abs Immature Granulocytes: 0.03 10*3/uL (ref 0.00–0.07)
Basophils Absolute: 0.1 10*3/uL (ref 0.0–0.1)
Basophils Relative: 2 %
Eosinophils Absolute: 0.6 10*3/uL — ABNORMAL HIGH (ref 0.0–0.5)
Eosinophils Relative: 9 %
HCT: 29.4 % — ABNORMAL LOW (ref 36.0–46.0)
Hemoglobin: 9 g/dL — ABNORMAL LOW (ref 12.0–15.0)
Immature Granulocytes: 0 %
Lymphocytes Relative: 12 %
Lymphs Abs: 0.8 10*3/uL (ref 0.7–4.0)
MCH: 34 pg (ref 26.0–34.0)
MCHC: 30.6 g/dL (ref 30.0–36.0)
MCV: 110.9 fL — ABNORMAL HIGH (ref 80.0–100.0)
Monocytes Absolute: 0.8 10*3/uL (ref 0.1–1.0)
Monocytes Relative: 12 %
Neutro Abs: 4.6 10*3/uL (ref 1.7–7.7)
Neutrophils Relative %: 65 %
Platelets: 334 10*3/uL (ref 150–400)
RBC: 2.65 MIL/uL — ABNORMAL LOW (ref 3.87–5.11)
RDW: 19.2 % — ABNORMAL HIGH (ref 11.5–15.5)
WBC: 7 10*3/uL (ref 4.0–10.5)
nRBC: 0 % (ref 0.0–0.2)

## 2020-12-17 LAB — T3: T3, Total: 101 ng/dL (ref 71–180)

## 2020-12-17 MED ORDER — METOPROLOL TARTRATE 25 MG PO TABS: 25.0000 mg | ORAL_TABLET | Freq: Two times a day (BID) | ORAL | Status: DC

## 2020-12-17 MED ORDER — LOPERAMIDE HCL 2 MG PO TABS: 4.0000 mg | ORAL_TABLET | Freq: Every day | ORAL | Status: DC | PRN

## 2020-12-17 NOTE — TOC Progression Note (Addendum)
Transition of Care [facility]) - Progression Note    Patient Details  Name: Shawna [name] MRN: [id number] Date of Birth: 1940/11/16  Transition of Care [facility]) CM/SW [place], LCSW Phone Number: 12/17/2020, 11:09 AM  Clinical Narrative:    Bufford Spikes has received insurance approval. Spouse aware. CSW contacted [facility] to make them aware as well.   Expected Discharge Plan: Skilled Nursing Facility Barriers to Discharge: Barriers Resolved  Expected Discharge Plan and Services Expected Discharge Plan: [place] In-house Referral: Clinical Social Work   Post Acute Care Choice: [place] Living arrangements for the past 2 months: Single Family Home Expected Discharge Date: 12/17/20                                     Social Determinants of Health (SDOH) Interventions    Readmission Risk Interventions Readmission Risk Prevention Plan 11/11/2020 10/04/2020 09/02/2020  Transportation Screening Complete Complete Complete  PCP or Specialist Appt within 3-5 Days - - -  HRI or [place] Work Consult for [place] - - -  Medication Review [employer] photographer) Complete Complete Complete  PCP or Specialist appointment within 3-5 days of discharge Complete Complete -  [place] or [place] Complete Complete Complete  SW Recovery Care/Counseling Consult Complete Complete Complete  Palliative Care Screening Not Applicable Not Applicable Not [place] Not Applicable Complete Patient Refused  Some recent data might be hidden

## 2020-12-17 NOTE — Progress Notes (Signed)
[facility] Progress Note   Subjective: Seen in room, very chatty. Pleasantly confused but cooperative. HD tomorrow on schedule.   Objective Vitals:   12/16/20 1314 12/16/20 1802 12/16/20 2043 12/17/20 0452  BP: (!) 126/35 (!) 124/17 (!) 119/41 (!) 144/39  Pulse: 66 67 71 76  Resp:  18 17 19   Temp: (!) 97.5 F (36.4 C) 97.7 F (36.5 C) 98.5 F (36.9 C) 98.3 F (36.8 C)  TempSrc: Oral Oral Oral Oral  SpO2: 98%  98% 100%  Weight: 52.3 kg      Physical Exam General: Chronically ill elderly female in NAD very agitated.  Heart: S1,S2 RRR No R/G Lungs:CTAB Abdomen: S, NT, ND Extremities: No LE edema Dialysis Access:. AVF +T/B     Additional Objective Labs: Basic Metabolic Panel: Recent Labs  Lab 12/11/20 0427 12/14/20 0146 12/15/20 0157 12/16/20 0947 12/17/20 0148  NA 139   < > 136 138 135  K 4.3   < > 3.9 3.9 3.9  CL 101   < > 99 101 96*  CO2 28   < > 27 28 28   GLUCOSE 86   < > 84 80 107*  BUN 24*   < > 15 31* 13  CREATININE 6.91*   < > 4.77* 8.01* 4.72*  CALCIUM 11.1*   < > 9.0 8.7* 8.6*  PHOS 5.1*  --  2.5 2.7  --    < > = values in this interval not displayed.   Liver Function Tests: Recent Labs  Lab 12/14/20 0146 12/15/20 0157 12/16/20 0947 12/17/20 0148  AST 20 23  --  26  ALT 16 19  --  18  ALKPHOS 39 41  --  46  BILITOT 0.6 0.6  --  0.3  PROT 5.4* 5.5*  --  6.1*  ALBUMIN 2.4* 2.5* 2.5* 2.8*   No results for input(s): LIPASE, AMYLASE in the last 168 hours. CBC: Recent Labs  Lab 12/11/20 0427 12/12/20 1036 12/14/20 0146 12/15/20 0157 12/16/20 0949 12/17/20 0148  WBC 7.5  --  7.1 6.2 7.1 7.0  NEUTROABS 5.1  --  4.6 4.0 4.7 4.6  HGB 6.2*   < > 7.8* 7.9* 7.7* 9.0*  HCT 20.1*   < > 25.5* 25.7* 24.9* 29.4*  MCV 116.2*  --  109.0* 108.9* 110.2* 110.9*  PLT 307  --  315 329 334 334   < > = values in this interval not displayed.   Blood Culture    Component Value Date/Time   SDES BLOOD RIGHT ARM 10/10/2016 0124    SPECREQUEST IN PEDIATRIC BOTTLE Blood Culture adequate volume 10/10/2016 0124   CULT NO GROWTH 5 DAYS 10/10/2016 0124   REPTSTATUS 10/15/2016 FINAL 10/10/2016 0124    Cardiac Enzymes: No results for input(s): CKTOTAL, CKMB, CKMBINDEX, TROPONINI in the last 168 hours. CBG: No results for input(s): GLUCAP in the last 168 hours. Iron Studies: No results for input(s): IRON, TIBC, TRANSFERRIN, FERRITIN in the last 72 hours. @lablastinr3 @ Studies/Results: No results found. Medications:   aspirin EC  81 mg Oral Daily   Chlorhexidine Gluconate Cloth  6 each Topical Q0600   darbepoetin (ARANESP) injection - DIALYSIS  60 mcg Intravenous Q Mon-HD   isosorbide mononitrate  30 mg Oral Daily   mouth rinse  15 mL Mouth Rinse BID   metoprolol tartrate  25 mg Oral BID   sevelamer carbonate  1,600 mg Oral TID WC     Outpatient Dialysis Orders: Center: [facility]  on MWF .  EDW 61kg HD Bath 2K/2.5Ca  Time 3.5 hrs Heparin none. Access LUE AVF BFR  Nipro Cellentia 17h Blood flow 300 DFR 500     Hectoral 5 mcg IV/HD Epogen 8000   Units IV/HD   given 12/02/20   Assessment/Plan:  ESRD -  continue MWF schedule. Next HD 6/10.  Hypertension/volume  - h/o noncomplaince with medications and came in w/ hypertensive urgency. BP still moderately elevated but reportedly had possible syncope on HD Wed.  She is now below her previous outpatient EDW with no volume excess on exam. Back on imdur. Many med intolerances including amlodipine and hydralazine.  Also did not tolerate ranexa and had hypotension with beta blocker per cardiology notes. Could consider trial of very low dose beta blocker if BP remains elevated. Very much under OP EDW. Will need to lower on DC Anemia  -Hgb 6.2 on 6/3 Transfused 1 unit prbcs --improved to 9.0.  No bleeding reported. Started aranesp 69 with HD on 12/07/20. (previously on epogen). Tsat 20% but has Fe allergy in chart. Metabolic bone disease /Hypercalcemia. Corrected calcium elevated  despite low calcium bath and holding VDRA. Unclear cause-suspect immobility most likely. PTH 69. Hypercalcemia may be contributing to her confusion. S/p pamidronate 60mg  on 6/2, calcium levels are improving now. Follow labs-C Ca Weakness - neurology on board; suspecting statin but she has also been on for many years.  Recent admission for weakness which improved with discontinuation of gabapentin + RRT. She feels the weakness is much better. Neuro recommends cont holding statin, outpatient EMG and neuropsych eval.  Nutrition - renal diet H/o myoclonus - Do not resume gabapentin as likely source of myoclonus and leg weakness -> improved after gabapentin was stopped last admission + RRT.  DM type 2 - per primary Vascular access - have had issues with avf in past but working well here so far.  Dispo: Awaiting SNF placement in next few days.   Bra[place] Millhouse H. Stanislaus Kaltenbach NP-C 12/17/2020, 9:41 AM  [employer] [phone]

## 2020-12-17 NOTE — TOC Transition Note (Signed)
Transition of Care [facility]) - CM/SW Discharge Note   Patient Details  Name: AEVAH STANSBERY MRN: [id number] Date of Birth: [birth year]  Transition of Care [facility]) CM/SW Contact:  Benard Halsted, LCSW Phone Number: 12/17/2020, 11:09 AM   Clinical Narrative:    Patient will DC to: [facility] Anticipated DC date: 12/17/20 Family notified: Spouse Transport by: [name]   Per MD patient ready for DC to [facility]. RN to call report prior to discharge [phone]). RN, patient, patient's family, and facility notified of DC. Discharge Summary and FL2 sent to facility. DC packet on chart. Ambulance transport requested for patient.   CSW will sign off for now as social work intervention is no longer needed. Please consult [place] again if new needs arise.     Final next level of care: Skilled Nursing Facility Barriers to Discharge: Barriers Resolved   Patient Goals and CMS Choice Patient states their goals for this hospitalization and ongoing recovery are:: Rehab CMS Medicare.gov Compare Post Acute Care list provided to:: Patient Represenative (must comment) Choice offered to / list presented to : Spouse  Discharge Placement   Existing PASRR number confirmed : 12/17/20          Patient chooses bed at: [facility]   Name of family member notified: Spouse Patient and family notified of of transfer: 12/17/20  Discharge Plan and Services In-house Referral: Clinical Social Work   Post Acute Care Choice: [place]                               Social Determinants of Health (SDOH) Interventions     Readmission Risk Interventions Readmission Risk Prevention Plan 11/11/2020 10/04/2020 09/02/2020  Transportation Screening Complete Complete Complete  PCP or Specialist Appt within 3-5 Days - - -  [place] or [place] Work Consult for [place] - - -  Medication Review Press photographer) Complete  Complete Complete  PCP or Specialist appointment within 3-5 days of discharge Complete Complete -  [place] or [place] Complete Complete Complete  SW Recovery Care/Counseling Consult Complete Complete Complete  Palliative Care Screening Not Applicable Not Applicable Not [place] Not Applicable Complete Patient Refused  Some recent data might be hidden

## 2020-12-17 NOTE — Progress Notes (Signed)
Report called to [facility], LPN at SNF.

## 2020-12-17 NOTE — Progress Notes (Signed)
Patient discharged to SNF via [facility]. I called Shawna [name], patient's husband to let him know.

## 2020-12-17 NOTE — Discharge Summary (Signed)
Shawna [name] IFO:[id number] DOB: August 15, 1940 DOA: 12/02/2020  PCP: [facility]  Admit date: 12/02/2020  Discharge date: 12/17/2020  Admitted From: Home  Disposition:  SNF   Recommendations for Outpatient Follow-up:   Follow up with PCP in 1-2 weeks  PCP Please obtain BMP/CBC, 2 view CXR in 1week,  (see Discharge instructions)   PCP Please follow up on the following pending results:    Home Health: None   Equipment/Devices: None  Consultations: Renal, Neuro Discharge Condition: Stable    CODE STATUS: Full    Diet Recommendation: Renal Low Carb, 1.5 lit/day fluid restriction  CC - weakness   Brief history of present illness from the day of admission and additional interim summary    Patient is a 80 y.o. female ESRD on HD, PAF, chronic diastolic heart failure, HTN, HLD who presented with worsening lower extremity weakness-with concerns for myositis on admission.  She underwent neurology eval-work-up was negative for myositis.  Hospital course complicated by acute metabolic encephalopathy, hypercalcemia and worsening anemia.  See below for further details.   Significant studies: 5/25>> CT head: No acute intracranial pathology. 5/25>> MRI brain: No acute intracranial abnormality. 5/26>> MRI left femur: No acute abnormality 5/26>> CXR: Resolution of pulm edema, left basilar scarring. 5/26>> SPEP: No M spike 5/26>> TSH: 9.19 5/26>> CK: 47 5/28>> x-ray left ankle: No fracture/dislocation   Antimicrobial therapy: None   Microbiology data: 5/25>> COVID PCR: Negative                                                                 Hospital Course    Bilateral lower extremity weakness: Neuroimaging as above-evaluated by neurology with recommendations for outpatient EMG/neuropsych evaluation.  CK  within normal limits.  Needs SNF on discharge.  Able to lift both lower extremities off the bed against mild resistance, overall much better, await SNF bed.   Acute metabolic encephalopathy: MRI brain negative-suspect this is probably mild metabolic encephalopathy in the setting of hypercalcemia.  Seems somewhat better today-although no family at bedside-but answering simple questions appropriately. Will need outpt Neuro follow up in 1-2 weeks.   Hypercalcemia: S/p pamidronate on 6/2-nephrology following and directing care.   PAF: Maintaining sinus rhythm-prior MD discussed with cardiology-amiodarone was discontinued in the setting of mildly elevated TSH.  Not a candidate for anticoagulation due to recurrent GI bleeding.     Elevated TSH with mildly elevated free T4.  Question if this is secondary hypothyroidism due to pituitary overactivity.  Will need Endo follow up in 1-2 weeks with repeat TSH, Free T4 and T3.    ESRD on HD: MWF HD, Nephrology following and directing inpatient HD care.   Syncope: Occurred on 6/1-in the setting of hypotension during HD.  Continue close monitoring.   HTN: BP reasonable-continue Imdur  Chronic diastolic heart failure: Volume management with HD.   Normocytic anemia: Due to ESRD-worsened by acute illness-no GI bleeding apparent.  Post 1 unit this admission, now stable H&H, no signs of ongoing bleeding..   Left ankle pain: Imaging negative-supportive care, resolved.  Discharge diagnosis     Principal Problem:   Myositis Active Problems:   ESRD on hemodialysis (HCC)   HLD (hyperlipidemia)   Chronic diastolic CHF (congestive heart failure) (HCC)   Essential hypertension   Paroxysmal atrial fibrillation [facility])    Discharge instructions    Discharge Instructions     Discharge instructions   Complete by: As directed    Follow with Primary MD [facility] in 7 days   Get CBC, CMP, TSH, Free T4, T3, 2 view Chest X ray -  checked next  visit within 1 week by Primary MD or SNF MD   Activity: As tolerated with Full fall precautions use walker/cane & assistance as needed  Disposition SNF  Diet: Renal-Low carbohydrate diet with 1.5 L fluid restriction per day, check CBGs QAC-HS   Special Instructions: If you have smoked or chewed Tobacco  in the last 2 yrs please stop smoking, stop any regular Alcohol  and or any Recreational drug use.  On your next visit with your primary care physician please Get Medicines reviewed and adjusted.  Please request your Prim.MD to go over all Hospital Tests and Procedure/Radiological results at the follow up, please get all Hospital records sent to your Prim MD by signing hospital release before you go home.  If you experience worsening of your admission symptoms, develop shortness of breath, life threatening emergency, suicidal or homicidal thoughts you must seek medical attention immediately by calling 911 or calling your MD immediately  if symptoms less severe.  You Must read complete instructions/literature along with all the possible adverse reactions/side effects for all the Medicines you take and that have been prescribed to you. Take any new Medicines after you have completely understood and accpet all the possible adverse reactions/side effects.   Increase activity slowly   Complete by: As directed        Discharge Medications   Allergies as of 12/17/2020       Reactions   Aspirin Other (See Comments)   High Doses Mess up her stomach; "makes my bowels have blood in them". Takes 81 mg EC Aspirin    Penicillins Other (See Comments)   SYNCOPE? , "makes me real weak when I take it; like I'll pass out" Has patient had a PCN reaction causing immediate rash, facial/tongue/throat swelling, SOB or lightheadedness with hypotension: Yes Has patient had a PCN reaction causing severe rash involving mucus membranes or skin necrosis: no Has patient had a PCN reaction that required  hospitalization no Has patient had a PCN reaction occurring within the last 10 years: no If all of the above   Amlodipine Swelling   Bactrim [sulfamethoxazole-trimethoprim] Rash   Contrast Media [iodinated Diagnostic Agents] Itching   Iron Itching, Other (See Comments)   "they gave me iron in dialysis; had to give me Benadryl cause I had to have the iron" (05/02/2012)   Nitrofurantoin Hives   Tylenol [acetaminophen] Itching, Other (See Comments)   Makes her feet on fire per pt   Gabapentin Other (See Comments)   Unknown reaction   Hydralazine Itching   Has tolerated while inpatient   Levofloxacin    No Known Allergies    Ranexa [ranolazine] Other (See Comments)   Myoclonus-hospitalized  Dexilant [dexlansoprazole] Other (See Comments)   Upset stomach   Levaquin [levofloxacin In D5w] Rash   Morphine And Related Itching, Other (See Comments)   Itching in feet   Plavix [clopidogrel Bisulfate] Rash   Protonix [pantoprazole Sodium] Rash   Venofer [ferric Oxide] Itching, Other (See Comments)   Patient reports using Benadryl prior to doses as [place]        Medication List     STOP taking these medications    amiodarone 200 MG tablet Commonly known as: PACERONE       TAKE these medications    aspirin EC 81 MG tablet Take 81 mg by mouth daily.   cyclobenzaprine 5 MG tablet Commonly known as: FLEXERIL Take 5 mg by mouth 3 (three) times daily as needed for muscle spasms.   fluticasone 50 MCG/ACT nasal spray Commonly known as: FLONASE Place 1 spray into both nostrils at bedtime as needed for allergies.   hydrOXYzine 100 MG capsule Commonly known as: VISTARIL Take 100 mg by mouth 2 (two) times daily as needed for anxiety or itching.   isosorbide mononitrate 60 MG 24 hr tablet Commonly known as: IMDUR Take 1 tablet (60 mg total) by mouth in the morning and at bedtime.   lidocaine-prilocaine cream Commonly known as: EMLA Apply 1 application topically every  Monday, Wednesday, and Friday. Prior to dialysis   loperamide 2 MG tablet Commonly known as: IMODIUM A-D Take 2 tablets (4 mg total) by mouth daily as needed for diarrhea or loose stools.   loratadine 10 MG tablet Commonly known as: CLARITIN Take 1 tablet (10 mg total) by mouth daily as needed for allergies.   metoprolol tartrate 25 MG tablet Commonly known as: LOPRESSOR Take 1 tablet (25 mg total) by mouth 2 (two) times daily.   multivitamin Tabs tablet Take 1 tablet by mouth daily.   nitroGLYCERIN 0.4 MG SL tablet Commonly known as: NITROSTAT Place 1 tablet (0.4 mg total) under the tongue every 5 (five) minutes x 3 doses as needed for chest pain (if no relief after 2nd dose, proceed to the ED for an evaluation or call 911).   omeprazole 20 MG capsule Commonly known as: PRILOSEC Take 1 capsule (20 mg total) by mouth daily.   sevelamer carbonate 800 MG tablet Commonly known as: RENVELA Take 2 tablets (1,600 mg total) by mouth 3 (three) times daily with meals.   simvastatin 20 MG tablet Commonly known as: ZOCOR Take 1 tablet (20 mg total) by mouth at bedtime.   triamcinolone cream 0.1 % Commonly known as: KENALOG Apply 1 application topically 2 (two) times daily as needed (itching).   triamcinolone 0.025 % ointment Commonly known as: KENALOG Apply 1 application topically 2 (two) times daily as needed (itching).         Contact information for follow-up providers     [facility]. Schedule an appointment as soon as possible for a visit in 1 week(s).   Contact information: [place] [address] [phone]         Renato Shin, MD. Schedule an appointment as soon as possible for a visit in 1 week(s).   Specialty: Endocrinology Why: Secondary hyper thyroidism Contact information: [address] [place] [address] [phone]         Jerline Pain, MD Follow up in 1 week(s).   Specialty: Cardiology Contact  information: [address] [place] [address] [phone]  [facility]. Schedule an appointment as soon as possible for a visit in 1 week(s).   Contact information: [name]     [place] [address] [phone]             Contact information for after-discharge care     [facility] .   Service: Skilled Nursing Contact information: [address] [place] [phone]                     Major procedures and Radiology Reports - PLEASE review detailed and final reports thoroughly  -      DG Ankle 2 Views Left  Result Date: 12/05/2020 CLINICAL DATA:  80 year old female with left foot pain. EXAM: LEFT ANKLE - 2 VIEW COMPARISON:  None. FINDINGS: There is no acute fracture or dislocation. The bones are osteopenic. The ankle mortise is intact. The soft tissues are unremarkable. Vascular calcifications noted. IMPRESSION: No acute fracture or dislocation. Electronically Signed   By: Anner Crete M.D.   On: 12/05/2020 17:20   CT Head Wo Contrast  Result Date: 12/02/2020 CLINICAL DATA:  80 year old female with head trauma. EXAM: CT HEAD WITHOUT CONTRAST TECHNIQUE: Contiguous axial images were obtained from the base of the skull through the vertex without intravenous contrast. COMPARISON:  Head CT dated 11/09/2020. FINDINGS: Brain: Moderate age-related atrophy and chronic microvascular ischemic changes. There is no acute intracranial hemorrhage. No mass effect or midline shift. No extra-axial fluid collection. Vascular: No hyperdense vessel or unexpected calcification. Skull: Normal. Negative for fracture or focal lesion. Sinuses/Orbits: Small retention cyst or polyp in the left sphenoid sinus. The remainder of the visualized paranasal sinuses and mastoid air cells are clear. No air-fluid level. Partially visualized cystic area  in the right maxilla at the right maxillary central incisor root. This may represent an area of periapical lucency or a dentigerous cyst. Correlation with dental exam recommended. Other: None IMPRESSION: 1. No acute intracranial pathology. 2. Moderate age-related atrophy and chronic microvascular ischemic changes. Electronically Signed   By: Anner Crete M.D.   On: 12/02/2020 19:25   MR Brain Wo Contrast (neuro protocol)  Result Date: 12/02/2020 CLINICAL DATA:  Initial evaluation for acute dizziness. EXAM: MRI HEAD WITHOUT CONTRAST TECHNIQUE: Multiplanar, multiecho pulse sequences of the brain and surrounding structures were obtained without intravenous contrast. COMPARISON:  Prior CT from earlier the same day as well as recent MRI from 11/10/2020. FINDINGS: Brain: Examination degraded by motion artifact. Generalized age-related cerebral atrophy. Fairly severe chronic microvascular ischemic disease noted involving the periventricular deep white matter both cerebral hemispheres, with more mild patchy involvement of the pons. Remote lacunar infarct at the left basal ganglia. Few additional small remote left cerebellar infarcts noted. No convincing foci of restricted diffusion to suggest acute or subacute ischemia. Gray-white matter differentiation maintained. No encephalomalacia to suggest chronic cortical infarction. No acute intracranial hemorrhage. Few scattered chronic micro hemorrhages noted about the cerebellum and right cerebral hemisphere, likely small vessel related. No mass lesion, midline shift or mass effect. No hydrocephalus or extra-axial fluid collection. Pituitary gland suprasellar region normal. Midline structures intact. Vascular: Major intracranial vascular flow voids are grossly maintained at the skull base. Skull and upper cervical spine: Craniocervical junction within normal limits. Bone marrow signal intensity normal. No focal marrow replacing lesion. No scalp soft tissue abnormality.  Sinuses/Orbits: Globes and orbital soft tissues demonstrate no acute finding. Mild mucosal thickening noted  within the left sphenoid sinus. Paranasal sinuses are otherwise clear. No mastoid effusion. Other: None. IMPRESSION: 1. No acute intracranial abnormality. 2. Age-related cerebral atrophy with advanced chronic microvascular ischemic disease, with a few scattered remote lacunar infarcts involving the left basal ganglia and left cerebellum. Electronically Signed   By: Jeannine Boga M.D.   On: 12/02/2020 22:43   MR FEMUR LEFT WO CONTRAST  Result Date: 12/03/2020 CLINICAL DATA:  Bilateral upper leg weakness and achiness; onset difficulty walking for approximately 1 month. EXAM: MR OF THE LEFT FEMUR WITHOUT CONTRAST TECHNIQUE: Multiplanar, multisequence MR imaging of the left femur was performed. No intravenous contrast was administered. COMPARISON:  None. FINDINGS: Bones/Joint/Cartilage No fracture, stress change or worrisome lesion is identified. No hip or SI joint effusion. Small bilateral knee joint effusions are noted. Joint spaces about the hips and knees appear preserved. No subchondral cyst formation or edema about any joint. No avascular necrosis of the femoral heads. Ligaments Negative. Muscles and Tendons Intact without tear or strain. No evidence of inflammatory change, atrophy or intramuscular mass. No fluid collection. Soft tissues Mild subcutaneous edema about both upper legs is likely due to dependent change. Imaged intrapelvic contents demonstrate sigmoid diverticulosis. The patient is status post hysterectomy. IMPRESSION: No acute abnormality or finding to explain the patient's symptoms. Mild bilateral subcutaneous edema is likely due to dependent change. Diverticulosis. Electronically Signed   By: Inge Rise M.D.   On: 12/03/2020 08:31   DG CHEST PORT 1 VIEW  Result Date: 12/03/2020 CLINICAL DATA:  Dyspnea EXAM: PORTABLE CHEST 1 VIEW COMPARISON:  11/09/2020 FINDINGS: The lungs  are symmetrically well expanded. Stable left basilar scarring. Tiny left pleural effusion or pleural thickening is unchanged. Coronary artery bypass grafting has been performed. Cardiac size is mildly enlarged, unchanged. Previously noted interstitial pulmonary edema has significantly improved. There is mild persistent pulmonary vascular redistribution of the lung apices in keeping with mild volume overload or early cardiogenic failure. No acute bone abnormality. IMPRESSION: Interval resolution of pulmonary edema with persistent mild central pulmonary vascular congestion. Stable cardiomegaly. Stable left basilar scarring. Small left pleural effusion or pleural scarring. Electronically Signed   By: Fidela Salisbury MD   On: 12/03/2020 23:36    Micro Results    No results found for this or any previous visit (from the past 240 hour(s)).  Today   Subjective    Shawna [name] today has no headache,no chest abdominal pain,no new weakness tingling or numbness, feels much better wants to go to SNF today.   Objective   Blood pressure (!) 144/39, pulse 76, temperature 98.3 F (36.8 C), temperature source Oral, resp. rate 19, weight 52.3 kg, SpO2 100 %.   Intake/Output Summary (Last 24 hours) at 12/17/2020 1022 Last data filed at 12/17/2020 0800 Gross per 24 hour  Intake 240 ml  Output 500 ml  Net -260 ml    Exam  Awake , mildly confused No new F.N deficits, Normal affect .AT,PERRAL Supple Neck,No JVD, No cervical lymphadenopathy appriciated.  Symmetrical Chest wall movement, Good air movement bilaterally, CTAB RRR,No Gallops,Rubs or new Murmurs, No Parasternal Heave +ve B.Sounds, Abd Soft, Non tender, No organomegaly appriciated, No rebound -guarding or rigidity. No Cyanosis, Clubbing or edema, No new Rash or bruise   Data Review   CBC w Diff:  Lab Results  Component Value Date   WBC 7.0 12/17/2020   HGB 9.0 (L) 12/17/2020   HCT 29.4 (L) 12/17/2020   PLT 334 12/17/2020   LYMPHOPCT 12  12/17/2020  BANDSPCT 0 10/15/2017   MONOPCT 12 12/17/2020   EOSPCT 9 12/17/2020   BASOPCT 2 12/17/2020    CMP:  Lab Results  Component Value Date   NA 135 12/17/2020   K 3.9 12/17/2020   CL 96 (L) 12/17/2020   CO2 28 12/17/2020   BUN 13 12/17/2020   CREATININE 4.72 (H) 12/17/2020   CREATININE 6.57 (H) 03/05/2019   PROT 6.1 (L) 12/17/2020   ALBUMIN 2.8 (L) 12/17/2020   BILITOT 0.3 12/17/2020   ALKPHOS 46 12/17/2020   AST 26 12/17/2020   ALT 18 12/17/2020  .   Total Time in preparing paper work, data evaluation and todays exam - 108 minutes  Lala Lund M.D on 12/17/2020 at 10:22 AM  [facility]

## 2020-12-17 NOTE — Progress Notes (Signed)
PT Cancellation Note  Patient Details Name: Shawna [name] MRN: [id number] DOB: [birth year]   Cancelled Treatment:    Reason Eval/Treat Not Completed: Other (comment) per chart, appears to be leaving for SNF this afternoon. Holding PT per dept protcol. Thank you for the opportunity to participate in her care!   Windell Norfolk, DPT, PN1   Supplemental Physical Therapist [facility]    Pager [phone] Acute Rehab Office [phone]

## 2020-12-18 ENCOUNTER — Emergency Department (HOSPITAL_COMMUNITY)
Admission: EM | Admit: 2020-12-18 | Discharge: 2020-12-18 | Disposition: A | Discharge status: Skilled Nursing Facility | Payer: Medicare HMO | Attending: Emergency Medicine | Admitting: Emergency Medicine

## 2020-12-18 ENCOUNTER — Other Ambulatory Visit: Payer: Self-pay

## 2020-12-18 ENCOUNTER — Emergency Department (HOSPITAL_COMMUNITY): Payer: Medicare HMO

## 2020-12-18 DIAGNOSIS — D631 Anemia in chronic kidney disease: Secondary | ICD-10-CM | POA: Diagnosis not present

## 2020-12-18 DIAGNOSIS — I2510 Atherosclerotic heart disease of native coronary artery without angina pectoris: Secondary | ICD-10-CM | POA: Insufficient documentation

## 2020-12-18 DIAGNOSIS — I5033 Acute on chronic diastolic (congestive) heart failure: Secondary | ICD-10-CM | POA: Insufficient documentation

## 2020-12-18 DIAGNOSIS — Z992 Dependence on renal dialysis: Secondary | ICD-10-CM | POA: Insufficient documentation

## 2020-12-18 DIAGNOSIS — R4182 Altered mental status, unspecified: Secondary | ICD-10-CM | POA: Insufficient documentation

## 2020-12-18 DIAGNOSIS — I480 Paroxysmal atrial fibrillation: Secondary | ICD-10-CM | POA: Insufficient documentation

## 2020-12-18 DIAGNOSIS — Z8543 Personal history of malignant neoplasm of ovary: Secondary | ICD-10-CM | POA: Insufficient documentation

## 2020-12-18 DIAGNOSIS — Z7982 Long term (current) use of aspirin: Secondary | ICD-10-CM | POA: Insufficient documentation

## 2020-12-18 DIAGNOSIS — I132 Hypertensive heart and chronic kidney disease with heart failure and with stage 5 chronic kidney disease, or end stage renal disease: Secondary | ICD-10-CM | POA: Diagnosis not present

## 2020-12-18 DIAGNOSIS — R55 Syncope and collapse: Secondary | ICD-10-CM | POA: Diagnosis not present

## 2020-12-18 DIAGNOSIS — N186 End stage renal disease: Secondary | ICD-10-CM | POA: Insufficient documentation

## 2020-12-18 DIAGNOSIS — Z85038 Personal history of other malignant neoplasm of large intestine: Secondary | ICD-10-CM | POA: Diagnosis not present

## 2020-12-18 DIAGNOSIS — Z79899 Other long term (current) drug therapy: Secondary | ICD-10-CM | POA: Insufficient documentation

## 2020-12-18 DIAGNOSIS — E1122 Type 2 diabetes mellitus with diabetic chronic kidney disease: Secondary | ICD-10-CM | POA: Diagnosis not present

## 2020-12-18 DIAGNOSIS — Z951 Presence of aortocoronary bypass graft: Secondary | ICD-10-CM | POA: Insufficient documentation

## 2020-12-18 LAB — CBG MONITORING, ED: Glucose-Capillary: 84 mg/dL (ref 70–99)

## 2020-12-18 LAB — CBC WITH DIFFERENTIAL/PLATELET
Basophils Absolute: 0 10*3/uL (ref 0.0–0.1)
Basophils Relative: 0 %
Eosinophils Absolute: 0.3 10*3/uL (ref 0.0–0.5)
Eosinophils Relative: 4 %
HCT: 30.9 % — ABNORMAL LOW (ref 36.0–46.0)
Hemoglobin: 9.4 g/dL — ABNORMAL LOW (ref 12.0–15.0)
Lymphocytes Relative: 8 %
Lymphs Abs: 0.6 10*3/uL — ABNORMAL LOW (ref 0.7–4.0)
MCH: 34.1 pg — ABNORMAL HIGH (ref 26.0–34.0)
MCHC: 30.4 g/dL (ref 30.0–36.0)
MCV: 112 fL — ABNORMAL HIGH (ref 80.0–100.0)
Monocytes Absolute: 0.6 10*3/uL (ref 0.1–1.0)
Monocytes Relative: 8 %
Neutro Abs: 6.3 10*3/uL (ref 1.7–7.7)
Neutrophils Relative %: 80 %
Platelets: 309 10*3/uL (ref 150–400)
RBC: 2.76 MIL/uL — ABNORMAL LOW (ref 3.87–5.11)
RDW: 18.8 % — ABNORMAL HIGH (ref 11.5–15.5)
WBC: 7.9 10*3/uL (ref 4.0–10.5)
nRBC: 0 % (ref 0.0–0.2)

## 2020-12-18 LAB — COMPREHENSIVE METABOLIC PANEL
ALT: 17 U/L (ref 0–44)
AST: 27 U/L (ref 15–41)
Albumin: 3.1 g/dL — ABNORMAL LOW (ref 3.5–5.0)
Alkaline Phosphatase: 52 U/L (ref 38–126)
Anion gap: 11 (ref 5–15)
BUN: 19 mg/dL (ref 8–23)
CO2: 29 mmol/L (ref 22–32)
Calcium: 8.4 mg/dL — ABNORMAL LOW (ref 8.9–10.3)
Chloride: 95 mmol/L — ABNORMAL LOW (ref 98–111)
Creatinine, Ser: 5.51 mg/dL — ABNORMAL HIGH (ref 0.44–1.00)
GFR, Estimated: 7 mL/min — ABNORMAL LOW (ref >60)
Glucose, Bld: 92 mg/dL (ref 70–99)
Potassium: 4.3 mmol/L (ref 3.5–5.1)
Sodium: 135 mmol/L (ref 135–145)
Total Bilirubin: 0.5 mg/dL (ref 0.3–1.2)
Total Protein: 6.6 g/dL (ref 6.5–8.1)

## 2020-12-18 IMAGING — DX DG CHEST 1V PORT
1 series · 1 of 1 positions shown · non-contrast
Comparison: Single-view of the chest [DATE].

CLINICAL DATA: Syncopal episode today.

EXAM:
PORTABLE CHEST 1 VIEW

[chest ap]
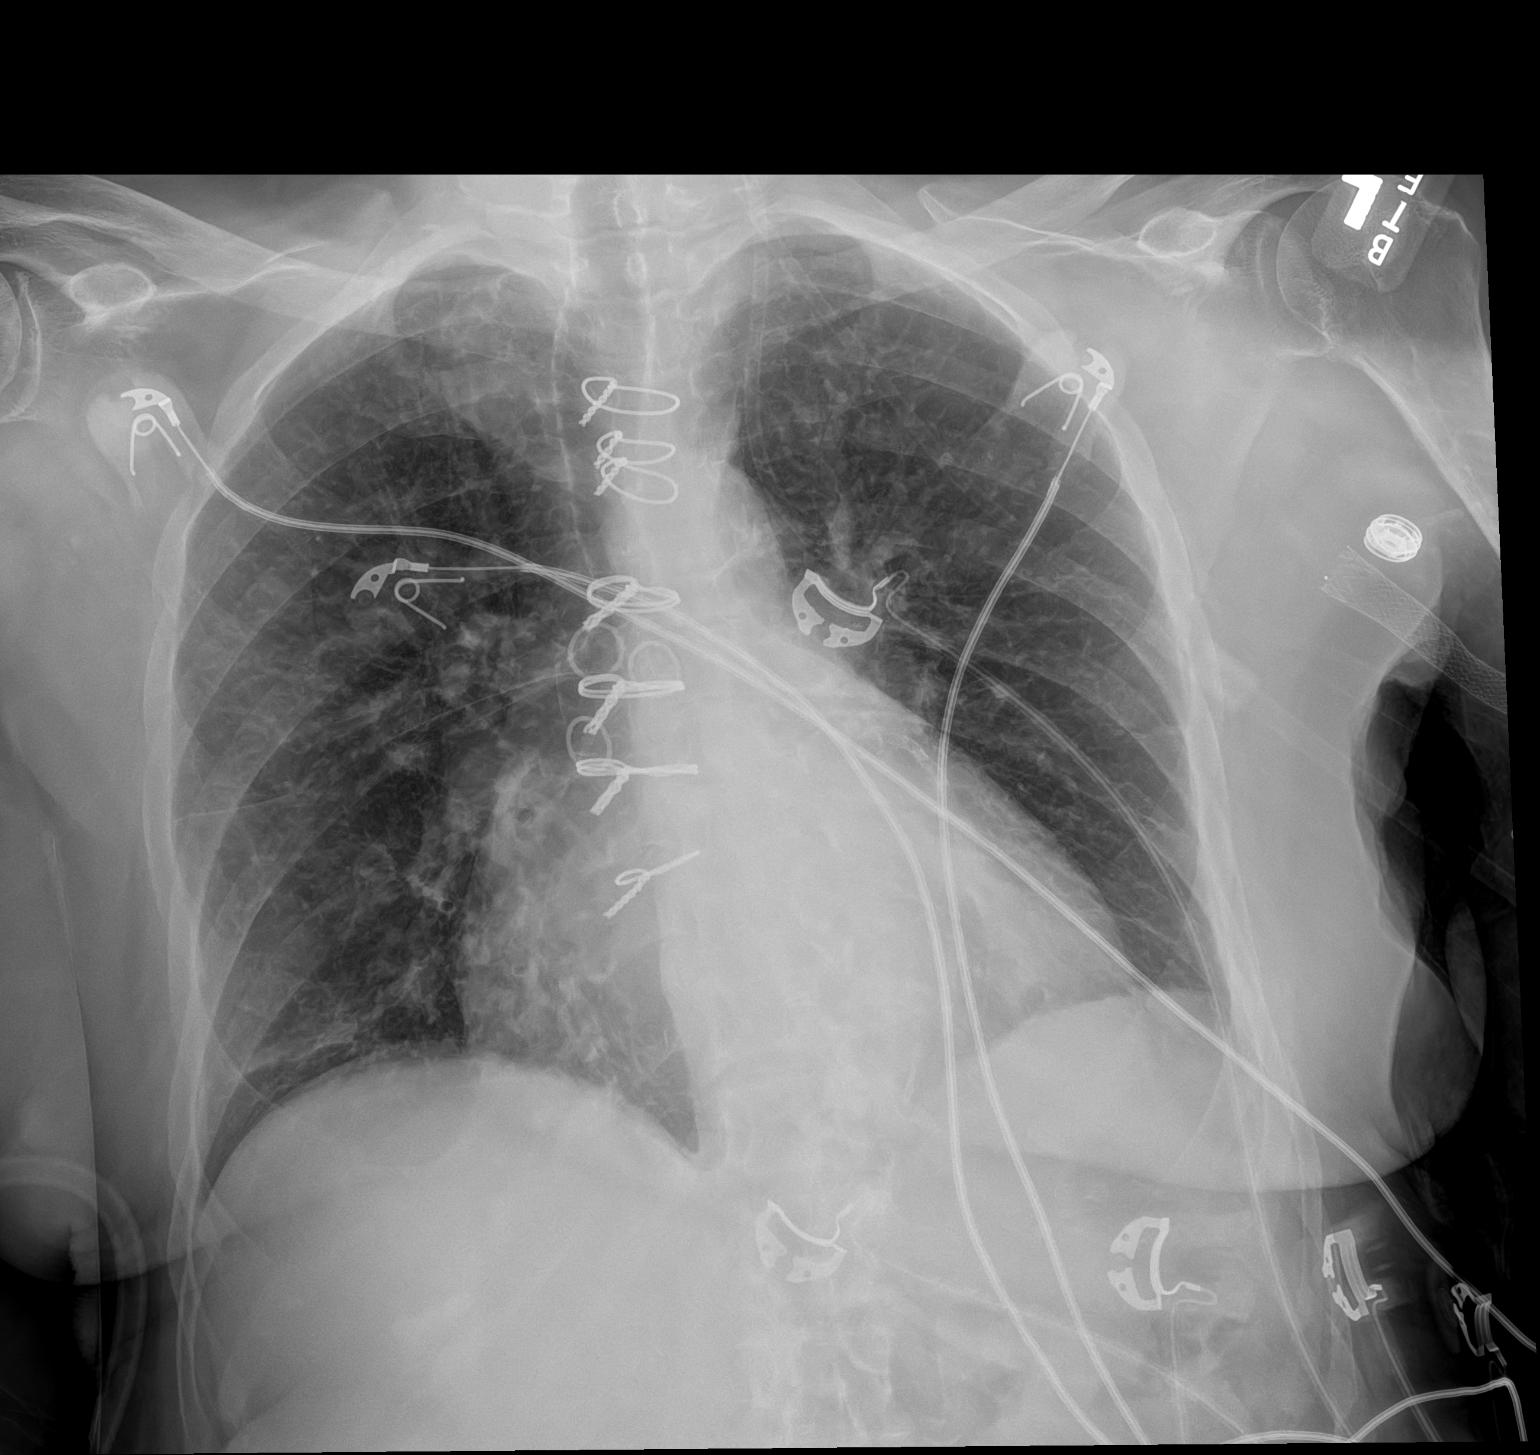

[1 of 1 positions shown; findings below may reference images not displayed]

FINDINGS: Lungs clear. The patient is status post CABG. Cardiomegaly. No
pneumothorax or pleural fluid. Aortic atherosclerosis.
IMPRESSION: Cardiomegaly without acute disease.

Aortic Atherosclerosis ([WT]-[WT]).

## 2020-12-18 IMAGING — CT CT HEAD W/O CM
3 series · 16 of 45 positions shown, 19 images · non-contrast
Comparison: [DATE]

CLINICAL DATA: Mental status change.

EXAM:
CT HEAD WITHOUT CONTRAST
TECHNIQUE: Contiguous axial images were obtained from the base of the skull
through the vertex without intravenous contrast.

[Series 2: head w o · axial · 0.42mm/px · z∈[+1642,+1757]mm · 10 of 28 slices shown, 13 images]
[im 3/28  brain]
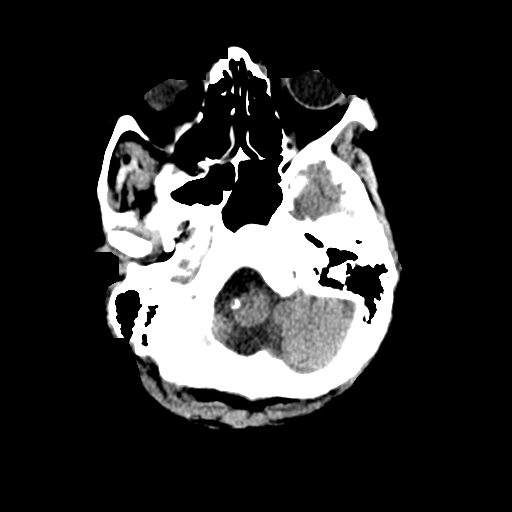
[im 3/28  bone]
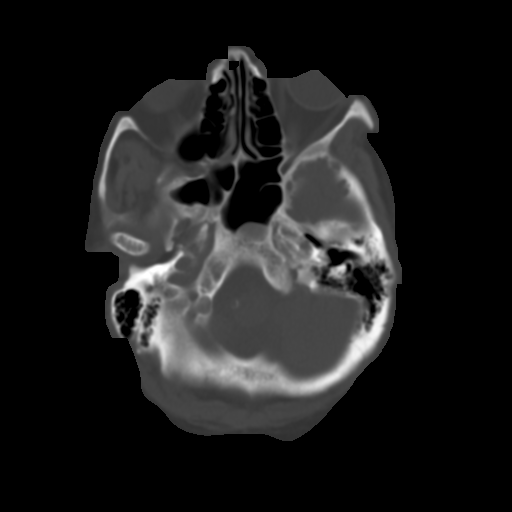
[im 5/28  brain]
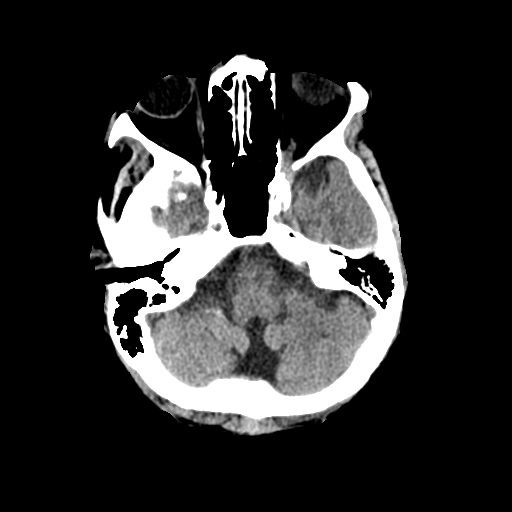
[im 8/28  brain]
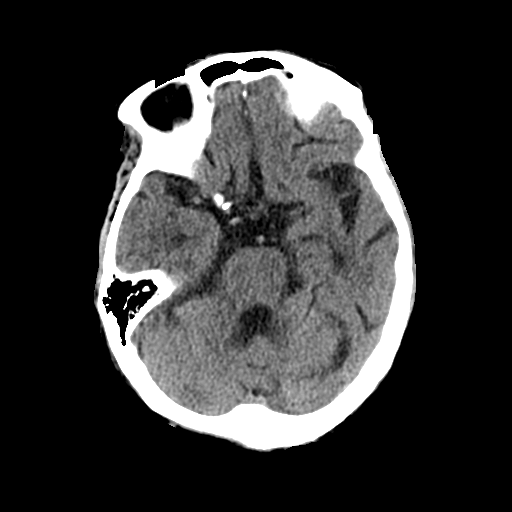
[im 11/28  brain]
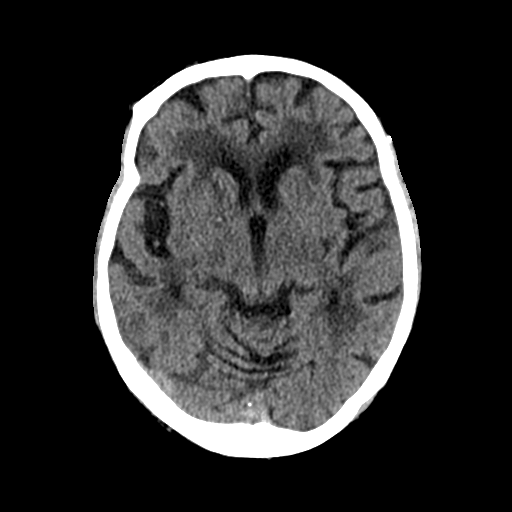
[im 13/28  brain]
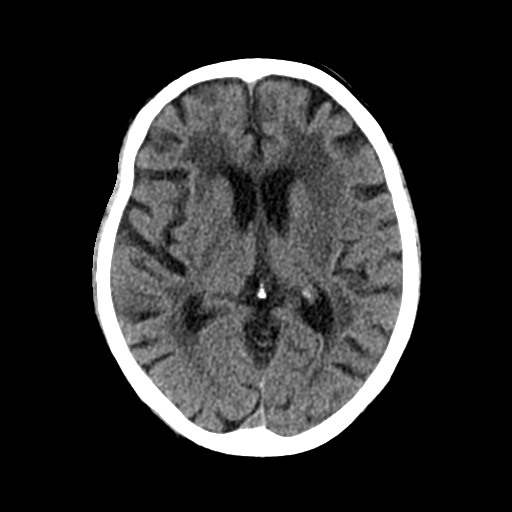
[im 13/28  bone]
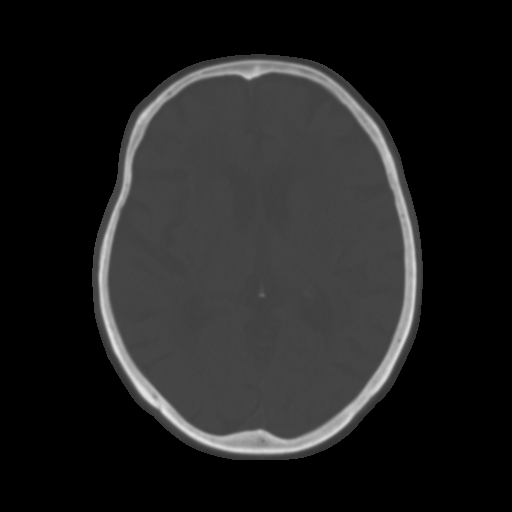
[im 16/28  brain]
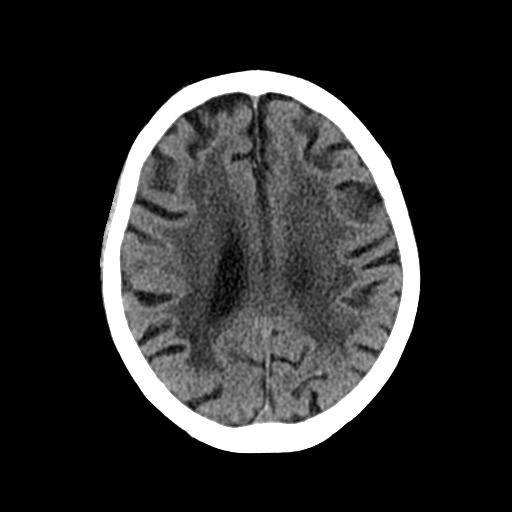
[im 18/28  brain]
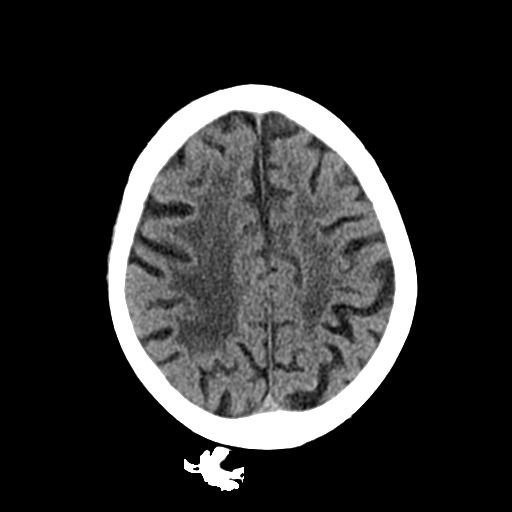
[im 21/28  brain]
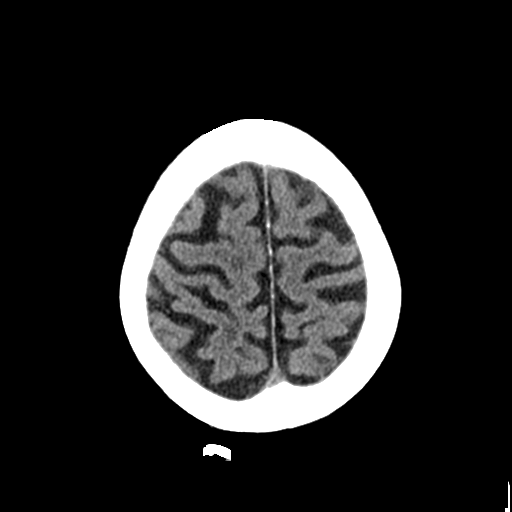
[im 24/28  brain]
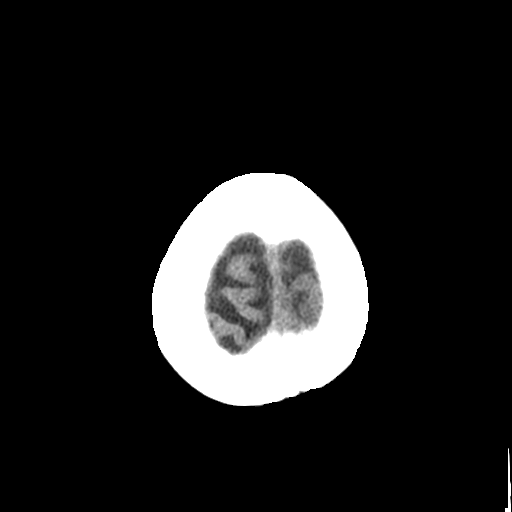
[im 24/28  bone]
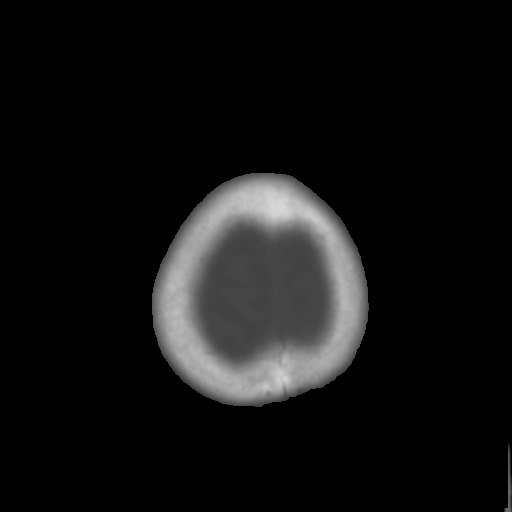
[im 26/28  brain]
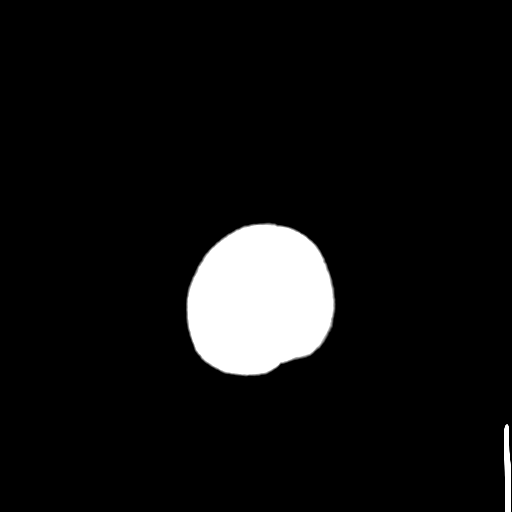

[Series 4: coronal soft · coronal · 0.31mm/px · 3 of 67 slices shown]
[im 23/67  brain]
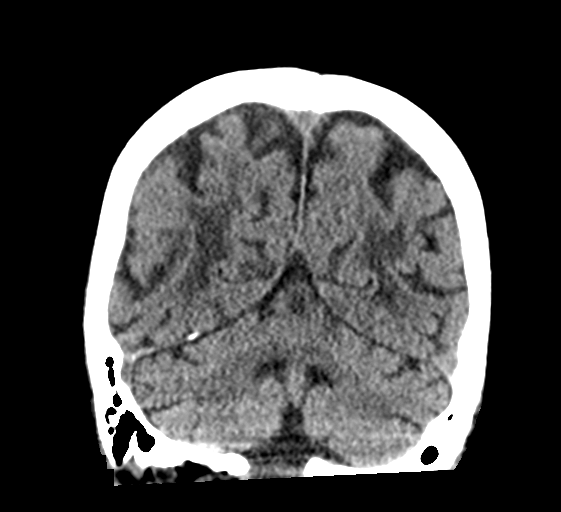
[im 30/67  brain]
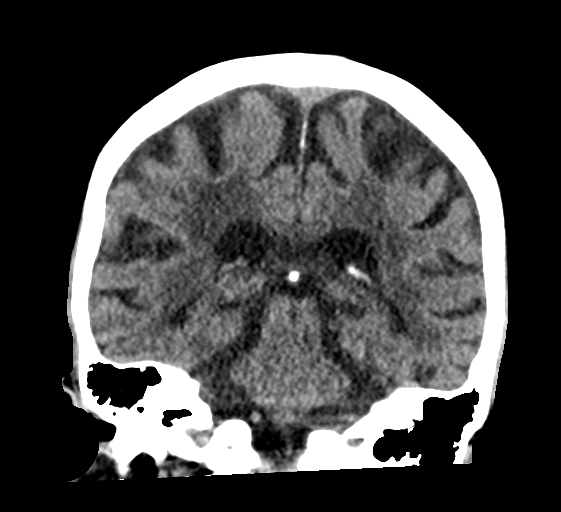
[im 37/67  brain]
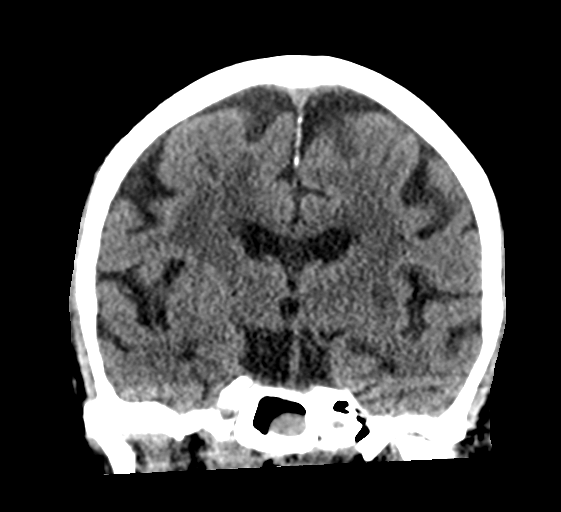

[Series 5: sagittal soft · sagittal · 0.30mm/px · 3 of 57 slices shown]
[im 19/57  brain]
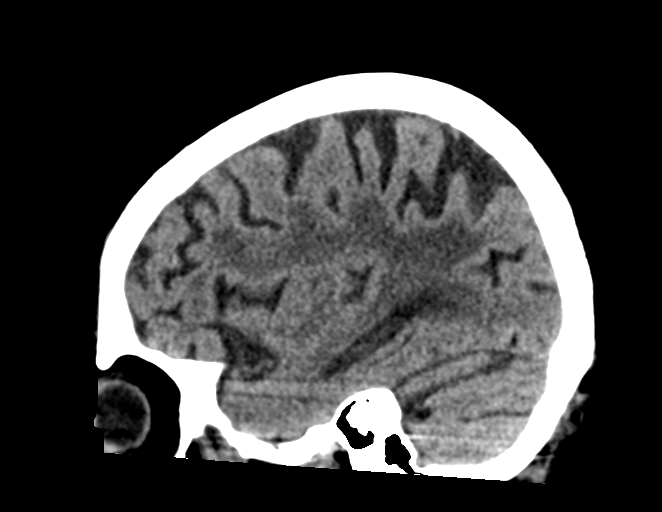
[im 29/57  brain]
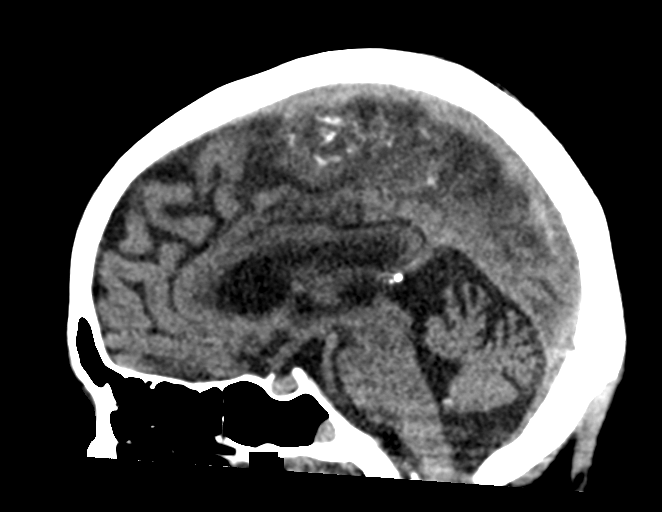
[im 38/57  brain]
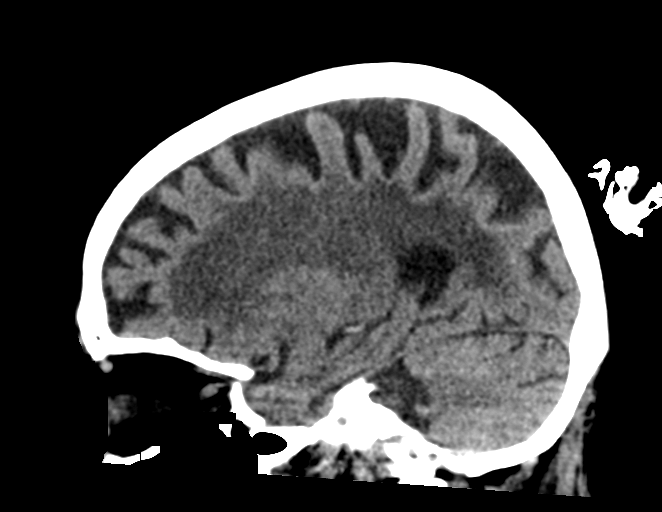

[16 of 45 positions shown; findings below may reference images not displayed]

FINDINGS: Brain: No subdural, epidural, or subarachnoid hemorrhage. Severe
white matter changes are stable. Ventricles and sulci are stable.
Cerebellum, brainstem, and basal cisterns are unremarkable. No mass
effect or midline shift. No acute cortical ischemia or infarct.

Vascular: Calcified atherosclerosis in the intracranial carotids.

Skull: Normal. Negative for fracture or focal lesion.

Sinuses/Orbits: No acute finding.

Other: None.
IMPRESSION: Severe chronic white matter changes.  No acute abnormalities.

## 2020-12-18 NOTE — ED Notes (Signed)
Patient transported to CT 

## 2020-12-18 NOTE — ED Provider Notes (Signed)
[facility] EMERGENCY DEPARTMENT Provider Note   CSN: [id number] Arrival date & time: 12/18/20  1224     History Chief Complaint  Patient presents with   Altered Mental Status    Shawna [name] is a 80 y.o. female.  80 year old female with past medical history of coronary artery disease, end-stage renal disease (attends dialysis Monday, Wednesday, Friday), CHF, paroxysmal A. fib, diabetes,, recently admitted to the hospital May 25 through June 9 (discharged yesterday) for acute myositis, course complicated by acute metabolic encephalopathy.  Patient was at dialysis today when she felt sick, and per EMS report, passed out.  Patient was found to be arousable but very lethargic, at time of exam she is able to answer simple questions and states that she got sick at dialysis today but feels better now.  Level 5 caveat applies.      Past Medical History:  Diagnosis Date   Acute on chronic respiratory failure with hypoxia ([place]) 10/10/2016   Anxiety    Arthritis    AVM (arteriovenous malformation) of colon    CAD (coronary artery disease)    a. s/p CABG in 2013 b. DES to D1 in 10/2016. c. cath in 07/2018 showing patent grafts with occlusion of D1 at prior stent site and progression of PDA disease --> medical management recommended   Carotid artery disease ([place])    a. 51-70% LICA, 0/1749    Chronic anemia    Chronic bronchitis (HCC)    Chronic diastolic CHF (congestive heart failure) ([place])    a. 02/2012 Echo EF 60-65%, nl wall motion, Gr 1 DD, mod MR   Colon cancer ([place]) 1992   Esophageal stricture    ESRD on hemodialysis ([place])    ESRD due to HTN, started dialysis 2011 and gets HD at [facility] with Dr Hinda Lenis on MWF schedule.  Access is LUA AVF as of Sept 2014.    GERD (gastroesophageal reflux disease)    High cholesterol 12/2011   History of blood transfusion 07/2011; 12/2011; 01/2012 X 2; 04/2012   History of gout    History of lower GI bleeding    Hypertension    Iron deficiency  anemia    Mitral regurgitation    a. Moderate by echo, 02/2012   Myocardial infarction [facility])    NSVT (nonsustained ventricular tachycardia) ([place])    Ovarian cancer ([place]) 1992   PAF (paroxysmal atrial fibrillation) ([place])    Pneumonia ~ 2009   PUD (peptic ulcer disease)    TIA (transient ischemic attack)     Patient Active Problem List   Diagnosis Date Noted   Myositis 12/03/2020   Ataxia 12/02/2020   Diabetes mellitus type 2 in nonobese ([place]) 11/10/2020   Acute pulmonary edema ([place]) 11/10/2020   Generalized weakness 11/09/2020   Dialysis AV fistula malfunction, initial encounter ([place])    Jugular vein occlusion, right (HCC)    Failure of surgically constructed arteriovenous fistula ([place]) 10/03/2020   Myoclonus 08/31/2020   Clotted renal dialysis AV graft, initial encounter ([place])    Hypertensive heart and chronic kidney disease with heart failure and stage 1 through stage 4 chronic kidney disease, or chronic kidney disease ([place])    Hemodialysis-associated hypotension    Acute hypoxemic respiratory failure ([place]) 06/14/2020   Hypertension 06/12/2020   Irritable bowel syndrome 02/25/2020   Adenomatous duodenal polyp 09/10/2019   History of GI bleed 09/10/2019   Angina pectoris ([place]) 06/05/2019   Chest pain 06/03/2019   Small intestinal bacterial overgrowth 05/14/2019  Iron deficiency anemia 04/02/2019   GI bleed 03/08/2019   Gastrointestinal hemorrhage with melena 03/06/2019   Acute respiratory failure with hypoxia (HCC) 12/25/2018   Elevated troponin 12/14/2018   Chest pain at rest 07/13/2018   Hand steal syndrome ([place]) 08/01/2017   Anemia 07/14/2017   Coronary artery disease 06/05/2017   Mesenteric ischemia (HCC)    Diverticulitis    Enteritis    Complication of vascular access for dialysis 03/19/2017   Preoperative clearance 01/25/2017   H/O non-ST elevation myocardial infarction (NSTEMI) 10/24/2016   Fluid overload 02/72/5366   Complication from renal dialysis device  10/10/2016   Non-ST elevation MI (NSTEMI) ([place])    Encounter for fitting and adjustment of vascular catheter    Heme positive stool    Demand ischemia ([place]) 07/27/2016   Hypertensive emergency 07/08/2016   Acute on chronic respiratory failure with hypoxia [facility])    Cardiac arrest [facility])    Palliative care encounter    Goals of care, counseling/discussion    Hypertensive crisis without congestive heart failure 05/09/2016   Flash pulmonary edema ([place]) 04/06/2016   Acute respiratory failure ([place]) 04/06/2016   Hypertensive crisis 01/27/2016   History of colon cancer 01/27/2016   History of ovarian cancer 01/27/2016   Hypertensive urgency 01/27/2016   Paroxysmal atrial fibrillation ([place]) 10/14/2015   Coronary angioplasty status 10/14/2015   Malignant neoplasm of right ovary ([place]) 10/14/2015   Narrow complex tachycardia ([place]) 09/08/2015   SVT (supraventricular tachycardia) ([place]) 09/08/2015   Influenza A 08/30/2015   Acute on chronic diastolic CHF (congestive heart failure) ([place]) 05/04/2015   Unstable angina ([place]) 05/03/2015   DOE (dyspnea on exertion)    Essential hypertension    Pain in joint, lower leg 08/14/2014   Dacryocystitis 05/29/2013   Chronic diastolic CHF (congestive heart failure) ([place]) 03/22/2013   GI bleeding 03/21/2013   Acute blood loss anemia 03/21/2013   Occlusion and stenosis of carotid artery without mention of cerebral infarction 01/24/2013   Hx of CABG 07/05/2012   Carotid artery disease ([place]) 07/05/2012   Mitral regurgitation 06/12/2012   Pneumonia 06/09/2012   Non-STEMI (non-ST elevated myocardial infarction) ([place]) 06/08/2012   Ischemic chest pain ([place]) 03/01/2012   AVM (arteriovenous malformation) of small bowel, acquired 01/20/2012   GERD (gastroesophageal reflux disease) 01/09/2012   HLD (hyperlipidemia) 01/05/2012   Atherosclerotic heart disease of native coronary artery without angina pectoris 12/16/2011   Essential hypertension, benign 12/16/2011    ESRD on hemodialysis ([place]) 12/16/2011   Anxiety disorder 05/04/2011   Anemia in chronic kidney disease 04/29/2011   ESRD (end stage renal disease) on dialysis ([place]) 04/29/2011   Gout 04/29/2011   Hypertensive chronic kidney disease with stage 5 chronic kidney disease or end stage renal disease ([place]) 04/29/2011    Past Surgical History:  Procedure Laterality Date   A/V SHUNTOGRAM Left 03/19/2019   Procedure: A/V SHUNTOGRAM;  Surgeon: Katha Cabal, MD;  Location: [place] CV LAB;  Service: Cardiovascular;  Laterality: Left;   ABDOMINAL HYSTERECTOMY  1992   APPENDECTOMY  06/1990   AV FISTULA PLACEMENT  07/2009   left upper arm   AV FISTULA PLACEMENT Right 09/06/2016   Procedure: RIGHT FOREARM ARTERIOVENOUS (AV) GRAFT;  Surgeon: Elam Dutch, MD;  Location: [place];  Service: Vascular;  Laterality: Right;   AV FISTULA PLACEMENT N/A 02/24/2017   Procedure: INSERTION OF ARTERIOVENOUS (AV) GORE-TEX GRAFT ARM (BRACHIAL AXILLARY);  Surgeon: Katha Cabal, MD;  Location: [facility] ORS;  Service: Vascular;  Laterality: N/A;  [place] REMOVAL Right 09/06/2016   Procedure: REMOVAL OF Right Arm ARTERIOVENOUS GORETEX GRAFT and Vein Patch angioplasty of brachial artery;  Surgeon: Angelia Mould, MD;  Location: [place];  Service: Vascular;  Laterality: Right;   BIOPSY  09/26/2019   Procedure: BIOPSY;  Surgeon: Rogene Houston, MD;  [facility];  Service: Endoscopy;;   COLON RESECTION  1992   COLON SURGERY     COLONOSCOPY N/A 03/09/2019   Procedure: COLONOSCOPY;  Surgeon: Rogene Houston, MD;  [facility];  Service: Endoscopy;  Laterality: N/A;   CORONARY ANGIOPLASTY WITH STENT PLACEMENT  12/15/11   "2"   CORONARY ANGIOPLASTY WITH STENT PLACEMENT  y/2013   "1; makes total of 3" (05/02/2012)   CORONARY ARTERY BYPASS GRAFT  06/13/2012   Procedure: CORONARY ARTERY BYPASS GRAFTING (CABG);  Surgeon: Grace Isaac, MD;  Location: [place];  Service: Open Heart Surgery;   Laterality: N/A;  cabg x four;  using left internal mammary artery, and left leg greater saphenous vein harvested endoscopically   CORONARY STENT INTERVENTION N/A 10/13/2016   Procedure: Coronary Stent Intervention;  Surgeon: Troy Sine, MD;  Location: [place] CV LAB;  Service: Cardiovascular;  Laterality: N/A;   DIALYSIS/PERMA CATHETER REMOVAL N/A 04/18/2017   Procedure: DIALYSIS/PERMA CATHETER REMOVAL;  Surgeon: Katha Cabal, MD;  Location: [place] CV LAB;  Service: Cardiovascular;  Laterality: N/A;   DILATION AND CURETTAGE OF UTERUS     ESOPHAGOGASTRODUODENOSCOPY  01/20/2012   Procedure: ESOPHAGOGASTRODUODENOSCOPY (EGD);  Surgeon: Ladene Artist, MD,FACG;  Location: [facility] ENDOSCOPY;  Service: Endoscopy;  Laterality: N/A;   ESOPHAGOGASTRODUODENOSCOPY N/A 03/26/2013   Procedure: ESOPHAGOGASTRODUODENOSCOPY (EGD);  Surgeon: Irene Shipper, MD;  Location: [facility] ENDOSCOPY;  Service: Endoscopy;  Laterality: N/A;   ESOPHAGOGASTRODUODENOSCOPY N/A 04/30/2015   Procedure: ESOPHAGOGASTRODUODENOSCOPY (EGD);  Surgeon: Rogene Houston, MD;  [facility];  Service: Endoscopy;  Laterality: N/A;  1pm - moved to 10/20 @ 1:10   ESOPHAGOGASTRODUODENOSCOPY N/A 07/29/2016   Procedure: ESOPHAGOGASTRODUODENOSCOPY (EGD);  Surgeon: Manus Gunning, MD;  Location: [place];  Service: Gastroenterology;  Laterality: N/A;  enteroscopy   ESOPHAGOGASTRODUODENOSCOPY N/A 09/26/2019   Procedure: ESOPHAGOGASTRODUODENOSCOPY (EGD);  Surgeon: Rogene Houston, MD;  [facility];  Service: Endoscopy;  Laterality: N/A;  [place] 03/07/2019   Procedure: GIVENS CAPSULE STUDY;  Surgeon: Rogene Houston, MD;  [facility];  Service: Endoscopy;  Laterality: N/A;  7:30   INSERTION OF DIALYSIS CATHETER N/A 10/05/2020   Procedure: ABORTED TUNNELED DIALYSIS CATHETER PLACEMENT RIGHT INTERNAL JUGULAR VEIN ;  Surgeon: Virl Cagey, MD;  Location: [facility];  Service:  General;  Laterality: N/A;   INTRAOPERATIVE TRANSESOPHAGEAL ECHOCARDIOGRAM  06/13/2012   Procedure: INTRAOPERATIVE TRANSESOPHAGEAL ECHOCARDIOGRAM;  Surgeon: Grace Isaac, MD;  Location: [place];  Service: Open Heart Surgery;  Laterality: N/A;   IR DIALY SHUNT INTRO NEEDLE/INTRACATH INITIAL W/IMG LEFT Left 10/06/2020   IR FLUORO GUIDE CV LINE RIGHT  06/17/2020   IR GENERIC HISTORICAL  07/26/2016   IR FLUORO GUIDE CV LINE RIGHT 07/26/2016 Greggory Keen, MD [facility]   IR GENERIC HISTORICAL  07/26/2016   IR US GUIDE VASC ACCESS RIGHT 07/26/2016 Greggory Keen, MD [facility]   IR GENERIC HISTORICAL  08/02/2016   IR US GUIDE VASC ACCESS RIGHT 08/02/2016 Greggory Keen, MD [facility]   IR GENERIC HISTORICAL  08/02/2016   IR FLUORO GUIDE CV LINE RIGHT 08/02/2016 Greggory Keen, MD  [facility]   IR RADIOLOGY PERIPHERAL GUIDED IV START  03/28/2017   IR REMOVAL TUN CV CATH W/O FL  08/11/2020   IR THROMBECTOMY AV FISTULA W/THROMBOLYSIS INC/SHUNT/IMG LEFT Left 06/17/2020   IR US GUIDE VASC ACCESS LEFT  06/17/2020   IR US GUIDE VASC ACCESS RIGHT  03/28/2017   IR US GUIDE VASC ACCESS RIGHT  06/17/2020   LEFT HEART CATH AND CORONARY ANGIOGRAPHY N/A 09/20/2016   Procedure: Left Heart Cath and Coronary Angiography;  Surgeon: [name], MD;  Location: [place] CV LAB;  Service: Cardiovascular;  Laterality: N/A;   LEFT HEART CATH AND CORS/GRAFTS ANGIOGRAPHY N/A 10/13/2016   Procedure: Left Heart Cath and Cors/Grafts Angiography;  Surgeon: Troy Sine, MD;  Location: [place] CV LAB;  Service: Cardiovascular;  Laterality: N/A;   LEFT HEART CATH AND CORS/GRAFTS ANGIOGRAPHY N/A 07/13/2018   Procedure: LEFT HEART CATH AND CORS/GRAFTS ANGIOGRAPHY;  Surgeon: Martinique, Peter M, MD;  Location: [place] CV LAB;  Service: Cardiovascular;  Laterality: N/A;   LEFT HEART CATHETERIZATION WITH CORONARY ANGIOGRAM N/A 12/15/2011   Procedure: LEFT HEART CATHETERIZATION WITH CORONARY ANGIOGRAM;  Surgeon: Burnell Blanks, MD;  Location: [facility] CATH LAB;  Service: Cardiovascular;  Laterality: N/A;   LEFT HEART CATHETERIZATION WITH CORONARY ANGIOGRAM N/A 01/10/2012   Procedure: LEFT HEART CATHETERIZATION WITH CORONARY ANGIOGRAM;  Surgeon: Peter M Martinique, MD;  Location: [facility] CATH LAB;  Service: Cardiovascular;  Laterality: N/A;   LEFT HEART CATHETERIZATION WITH CORONARY ANGIOGRAM N/A 06/08/2012   Procedure: LEFT HEART CATHETERIZATION WITH CORONARY ANGIOGRAM;  Surgeon: Burnell Blanks, MD;  Location: [facility] CATH LAB;  Service: Cardiovascular;  Laterality: N/A;   LEFT HEART CATHETERIZATION WITH CORONARY/GRAFT ANGIOGRAM N/A 12/10/2013   Procedure: LEFT HEART CATHETERIZATION WITH Beatrix Fetters;  Surgeon: [name], MD;  Location: [facility] CATH LAB;  Service: Cardiovascular;  Laterality: N/A;   OVARY SURGERY     ovarian cancer   POLYPECTOMY  03/09/2019   Procedure: POLYPECTOMY;  Surgeon: Rogene Houston, MD;  [facility];  Service: Endoscopy;;  cecal    POLYPECTOMY N/A 09/26/2019   Procedure: DUODENAL POLYPECTOMY;  Surgeon: Rogene Houston, MD;  [facility];  Service: Endoscopy;  Laterality: N/A;   REVISION OF ARTERIOVENOUS GORETEX GRAFT N/A 02/24/2017   Procedure: REVISION OF ARTERIOVENOUS GORETEX GRAFT (RESECTION);  Surgeon: Katha Cabal, MD;  Location: [facility] ORS;  Service: Vascular;  Laterality: N/A;   REVISON OF ARTERIOVENOUS FISTULA Left 06/19/2020   Procedure: REVISION OF LEFT UPPER ARM AV GRAFT WITH INTERPOSITION JUMP GRAFT USING 6MM GORE LIMB;  Surgeon: Marty Heck, MD;  Location: [place];  Service: Vascular;  Laterality: Left;   SHUNTOGRAM N/A 10/15/2013   Procedure: Fistulogram;  Surgeon: Serafina Mitchell, MD;  Location: [facility] CATH LAB;  Service: Cardiovascular;  Laterality: N/A;   THROMBECTOMY / ARTERIOVENOUS GRAFT REVISION  2011   left upper arm   TUBAL LIGATION  1980's   UPPER EXTREMITY ANGIOGRAPHY Bilateral 12/06/2016   Procedure: Upper Extremity Angiography;   Surgeon: Katha Cabal, MD;  Location: [place] CV LAB;  Service: Cardiovascular;  Laterality: Bilateral;   UPPER EXTREMITY INTERVENTION Left 06/06/2017   Procedure: UPPER EXTREMITY INTERVENTION;  Surgeon: Katha Cabal, MD;  Location: [place] CV LAB;  Service: Cardiovascular;  Laterality: Left;     OB History     Gravida  2   Para  2   Term      Preterm  2   AB  Living  2      SAB      IAB      Ectopic      Multiple      Live Births              Family History  Problem Relation Age of Onset   Heart disease Mother        Heart Disease before age 90   Hyperlipidemia Mother    Hypertension Mother    Diabetes Mother    Heart attack Mother    Heart disease Father        Heart Disease before age 74   Hyperlipidemia Father    Hypertension Father    Diabetes Father    Diabetes Sister    Hypertension Sister    Diabetes Brother    Hyperlipidemia Brother    Heart attack Brother    Hypertension Sister    Heart attack Brother    Colon cancer Child 49   Other Other        noncontributory for early CAD   Esophageal cancer Neg Hx    Liver disease Neg Hx    Kidney disease Neg Hx    Colon polyps Neg Hx     Social History   Tobacco Use   Smoking status: Never   Smokeless tobacco: Never  Vaping Use   Vaping Use: Never used  Substance Use Topics   Alcohol use: No    Alcohol/week: 0.0 standard drinks   Drug use: No    Home Medications Prior to Admission medications   Medication Sig Start Date End Date Taking? Authorizing Provider  aspirin EC 81 MG tablet Take 81 mg by mouth daily.  09/27/19   Rehman, Mechele Dawley, MD  cyclobenzaprine (FLEXERIL) 5 MG tablet Take 5 mg by mouth 3 (three) times daily as needed for muscle spasms. 11/30/20   [provider]  fluticasone (FLONASE) 50 MCG/ACT nasal spray Place 1 spray into both nostrils at bedtime as needed for allergies.     [provider]  hydrOXYzine (VISTARIL) 100 MG  capsule Take 100 mg by mouth 2 (two) times daily as needed for anxiety or itching. 11/23/20   [provider]  isosorbide mononitrate (IMDUR) 60 MG 24 hr tablet Take 1 tablet (60 mg total) by mouth in the morning and at bedtime. 10/20/20 01/18/21  Erma Heritage, PA-C  lidocaine-prilocaine (EMLA) cream Apply 1 application topically every Monday, Wednesday, and Friday. Prior to dialysis    [provider]  loperamide (IMODIUM A-D) 2 MG tablet Take 2 tablets (4 mg total) by mouth daily as needed for diarrhea or loose stools. 12/17/20   Thurnell Lose, MD  loratadine (CLARITIN) 10 MG tablet Take 1 tablet (10 mg total) by mouth daily as needed for allergies. 07/14/18   Hosie Poisson, MD  metoprolol tartrate (LOPRESSOR) 25 MG tablet Take 1 tablet (25 mg total) by mouth 2 (two) times daily. 12/17/20   Thurnell Lose, MD  multivitamin (RENA-VIT) TABS tablet Take 1 tablet by mouth daily.    [provider]  nitroGLYCERIN (NITROSTAT) 0.4 MG SL tablet Place 1 tablet (0.4 mg total) under the tongue every 5 (five) minutes x 3 doses as needed for chest pain (if no relief after 2nd dose, proceed to the ED for an evaluation or call 911). 09/08/20   Verta Ellen., NP  omeprazole (PRILOSEC) 20 MG capsule Take 1 capsule (20 mg total) by mouth daily. 12/01/20  Rogene Houston, MD  sevelamer carbonate (RENVELA) 800 MG tablet Take 2 tablets (1,600 mg total) by mouth 3 (three) times daily with meals. 06/20/20   Meccariello, Bernita Raisin, DO  simvastatin (ZOCOR) 20 MG tablet Take 1 tablet (20 mg total) by mouth at bedtime. 07/07/20   Verta Ellen., NP  triamcinolone (KENALOG) 0.025 % ointment Apply 1 application topically 2 (two) times daily as needed (itching). 11/20/20   [provider]  triamcinolone cream (KENALOG) 0.1 % Apply 1 application topically 2 (two) times daily as needed (itching). 11/19/20   [provider]    Allergies    Aspirin, Penicillins, Amlodipine,  Bactrim [sulfamethoxazole-trimethoprim], Contrast media [iodinated diagnostic agents], Iron, Nitrofurantoin, Tylenol [acetaminophen], Gabapentin, Hydralazine, Levofloxacin, No known allergies, Ranexa [ranolazine], Dexilant [dexlansoprazole], Levaquin [levofloxacin in d5w], Morphine and related, Plavix [clopidogrel bisulfate], Protonix [pantoprazole sodium], and Venofer [ferric oxide]  Review of Systems   Review of Systems  Unable to perform ROS: Mental status change   Physical Exam Updated Vital Signs BP (!) 134/106   Pulse 62   Temp (!) 97.2 F (36.2 C) (Oral)   Resp 17   Ht 5\' 1"  (1.549 m)   Wt 55 kg   SpO2 100%   BMI 22.91 kg/m   Physical Exam Vitals and nursing note reviewed.  HENT:     Mouth/Throat:     Mouth: Mucous membranes are dry.  Eyes:     Extraocular Movements: Extraocular movements intact.     Pupils: Pupils are equal, round, and reactive to light.  Cardiovascular:     Rate and Rhythm: Normal rate and regular rhythm.     Pulses: Normal pulses.     Heart sounds: Normal heart sounds.  Pulmonary:     Effort: Pulmonary effort is normal.     Breath sounds: Normal breath sounds.  Abdominal:     Palpations: Abdomen is soft.     Tenderness: There is no abdominal tenderness.  Musculoskeletal:     Cervical back: Neck supple.     Right lower leg: No edema.     Left lower leg: No edema.  Skin:    General: Skin is warm and dry.     Findings: No erythema or rash.  Neurological:     GCS: GCS eye subscore is 4. GCS verbal subscore is 4. GCS motor subscore is 6.     Sensory: No sensory deficit.     Motor: No weakness.     Comments: Answers simple questions, is alert to person, place, day of week. Tangential, talks about shoe shopping     ED Results / Procedures / Treatments   Labs (all labs ordered are listed, but only abnormal results are displayed) Labs Reviewed  CBC WITH DIFFERENTIAL/PLATELET - Abnormal; Notable for the following components:      Result Value    RBC 2.76 (*)    Hemoglobin 9.4 (*)    HCT 30.9 (*)    MCV 112.0 (*)    MCH 34.1 (*)    RDW 18.8 (*)    Lymphs Abs 0.6 (*)    All other components within normal limits  COMPREHENSIVE METABOLIC PANEL - Abnormal; Notable for the following components:   Chloride 95 (*)    Creatinine, Ser 5.51 (*)    Calcium 8.4 (*)    Albumin 3.1 (*)    GFR, Estimated 7 (*)    All other components within normal limits  URINALYSIS, ROUTINE W REFLEX MICROSCOPIC  CBG MONITORING, ED  EKG EKG Interpretation  Date/Time:  Friday December 18 2020 12:35:45 EDT Ventricular Rate:  66 PR Interval:  196 QRS Duration: 152 QT Interval:  516 QTC Calculation: 541 R Axis:   19 Text Interpretation: Sinus rhythm Left bundle branch block since last tracing no significant change Confirmed by Daleen Bo [phone]) on 12/18/2020 1:24:55 PM  Radiology CT Head Wo Contrast  Result Date: 12/18/2020 CLINICAL DATA:  Mental status change. EXAM: CT HEAD WITHOUT CONTRAST TECHNIQUE: Contiguous axial images were obtained from the base of the skull through the vertex without intravenous contrast. COMPARISON:  Dec 02, 2020 FINDINGS: Brain: No subdural, epidural, or subarachnoid hemorrhage. Severe white matter changes are stable. Ventricles and sulci are stable. Cerebellum, brainstem, and basal cisterns are unremarkable. No mass effect or midline shift. No acute cortical ischemia or infarct. Vascular: Calcified atherosclerosis in the intracranial carotids. Skull: Normal. Negative for fracture or focal lesion. Sinuses/Orbits: No acute finding. Other: None. IMPRESSION: Severe chronic white matter changes.  No acute abnormalities. Electronically Signed   By: Dorise Bullion III M.D   On: 12/18/2020 20:39   DG Chest Port 1 View  Result Date: 12/18/2020 CLINICAL DATA:  Syncopal episode today. EXAM: PORTABLE CHEST 1 VIEW COMPARISON:  Single-view of the chest 10/03/2020. FINDINGS: Lungs clear. The patient is status post CABG. Cardiomegaly. No  pneumothorax or pleural fluid. Aortic atherosclerosis. IMPRESSION: Cardiomegaly without acute disease. Aortic Atherosclerosis (ICD10-I70.0). Electronically Signed   By: Inge Rise M.D.   On: 12/18/2020 14:44    Procedures Procedures   Medications Ordered in ED Medications - No data to display  ED Course  I have reviewed the triage vital signs and the nursing notes.  Pertinent labs & imaging results that were available during my care of the patient were reviewed by me and considered in my medical decision making (see chart for details).  Clinical Course as of 12/18/20 2046  Fri Dec 18, 2333  967 80 year old female brought in from dialysis after concern for syncopal event at dialysis.  Patient states that she got sick at dialysis but feels fine now. Patient is little tangential however able to answer simple questions appropriately and follows commands. Labs without significant change from baseline including CBC and CMP.  CT head unremarkable.  Plan is for discharge to facility. [LM]    Clinical Course User Index [LM] Roque Lias   MDM Rules/Calculators/A&P                           Final Clinical Impression(s) / ED Diagnoses Final diagnoses:  Syncope, unspecified syncope type    Rx / DC Orders ED Discharge Orders     None        Roque Lias 12/18/20 2046    Daleen Bo, MD 12/19/20 1010

## 2020-12-18 NOTE — ED Triage Notes (Signed)
Pt was at dialysis today for tx and before tx was complete pt had a loss of consciousness pt is arousable but very lethargic

## 2020-12-18 NOTE — Progress Notes (Signed)
   NEPHROLOGY NURSING NOTE:  aVF needles had been removed and pressure clamps had been applied by outpatient dialysis staff prior to sending pt to [facility] ED.  Clamps were removed at 1755.  Palpable thrill noted above and below cannulation sites.  Rockwell Alexandria, RN

## 2020-12-18 NOTE — ED Notes (Signed)
Pt returned from CT Scan 

## 2020-12-22 ENCOUNTER — Other Ambulatory Visit: Payer: Self-pay

## 2020-12-22 ENCOUNTER — Encounter: Payer: Self-pay | Admitting: Cardiology

## 2020-12-22 ENCOUNTER — Ambulatory Visit: Payer: Medicare HMO | Admitting: "Endocrinology

## 2020-12-22 ENCOUNTER — Ambulatory Visit (INDEPENDENT_AMBULATORY_CARE_PROVIDER_SITE_OTHER): Payer: Medicare HMO | Admitting: Cardiology

## 2020-12-22 VITALS — BP 130/58 | HR 70 | Ht 62.0 in | Wt 131.6 lb

## 2020-12-22 DIAGNOSIS — I2510 Atherosclerotic heart disease of native coronary artery without angina pectoris: Secondary | ICD-10-CM | POA: Diagnosis not present

## 2020-12-22 DIAGNOSIS — I4891 Unspecified atrial fibrillation: Secondary | ICD-10-CM

## 2020-12-22 DIAGNOSIS — R55 Syncope and collapse: Secondary | ICD-10-CM

## 2020-12-22 MED ORDER — MIDODRINE HCL 5 MG PO TABS: ORAL_TABLET | ORAL | 6 refills | Status: DC

## 2020-12-22 NOTE — Progress Notes (Signed)
Clinical Summary Shawna [name] is a 80 y.o.female   1.CAD - history of CABG in 2013, along with DES to D1 in 10/2016 - cath in 07/2018 showing patent grafts with occlusion of D1 at prior stent site and progression of PDA disease --> medical management recommended)  - did not tolerate Ranexa and had hypotension with BB therapy in the past. - some chest pain every 2-3 days. Overall stable.    2. Chronic diastolic HF - fluid managed with HD   3. HTN   4. Hyperlipidemia   5. PAF - not on anticoagulation given history of GIB - infrequent palpitations.   6. NSVT - had been on amio according to notes - amio was stopped at 12/17/20 discharge in setting of elevated TSH   7. ESRD   8. Metabolic encephalopathy - recent admission 12/2020, hypercalcemia  9. Syncope - issues during HD, issues with hypotension, has had some syncope and near syncope  Past Medical History:  Diagnosis Date   Acute on chronic respiratory failure with hypoxia (HCC) 10/10/2016   Anxiety    Arthritis    AVM (arteriovenous malformation) of colon    CAD (coronary artery disease)    a. s/p CABG in 2013 b. DES to D1 in 10/2016. c. cath in 07/2018 showing patent grafts with occlusion of D1 at prior stent site and progression of PDA disease --> medical management recommended   Carotid artery disease ([place])    a. 65-46% LICA, 11/352    Chronic anemia    Chronic bronchitis (HCC)    Chronic diastolic CHF (congestive heart failure) ([place])    a. 02/2012 Echo EF 60-65%, nl wall motion, Gr 1 DD, mod MR   Colon cancer ([place]) 1992   Esophageal stricture    ESRD on hemodialysis ([place])    ESRD due to HTN, started dialysis 2011 and gets HD at [facility] with Dr Hinda Lenis on MWF schedule.  Access is LUA AVF as of Sept 2014.    GERD (gastroesophageal reflux disease)    High cholesterol 12/2011   History of blood transfusion 07/2011; 12/2011; 01/2012 X 2; 04/2012   History of gout    History of lower GI bleeding     Hypertension    Iron deficiency anemia    Mitral regurgitation    a. Moderate by echo, 02/2012   Myocardial infarction [facility])    NSVT (nonsustained ventricular tachycardia) ([place])    Ovarian cancer ([place]) 1992   PAF (paroxysmal atrial fibrillation) ([place])    Pneumonia ~ 2009   PUD (peptic ulcer disease)    TIA (transient ischemic attack)      Allergies  Allergen Reactions   Aspirin Other (See Comments)    High Doses Mess up her stomach; "makes my bowels have blood in them". Takes 81 mg EC Aspirin    Penicillins Other (See Comments)    SYNCOPE? , "makes me real weak when I take it; like I'll pass out"  Has patient had a PCN reaction causing immediate rash, facial/tongue/throat swelling, SOB or lightheadedness with hypotension: Yes Has patient had a PCN reaction causing severe rash involving mucus membranes or skin necrosis: no Has patient had a PCN reaction that required hospitalization no Has patient had a PCN reaction occurring within the last 10 years: no If all of the above   Amlodipine Swelling   Bactrim [Sulfamethoxazole-Trimethoprim] Rash   Contrast Media [Iodinated Diagnostic Agents] Itching   Iron Itching and Other (See Comments)    "they  gave me iron in dialysis; had to give me Benadryl cause I had to have the iron" (05/02/2012)   Nitrofurantoin Hives   Tylenol [Acetaminophen] Itching and Other (See Comments)    Makes her feet on fire per pt   Gabapentin Other (See Comments)    Unknown reaction   Hydralazine Itching    Has tolerated while inpatient   Levofloxacin    No Known Allergies    Ranexa [Ranolazine] Other (See Comments)    Myoclonus-hospitalized    Dexilant [Dexlansoprazole] Other (See Comments)    Upset stomach   Levaquin [Levofloxacin In D5w] Rash   Morphine And Related Itching and Other (See Comments)    Itching in feet   Plavix [Clopidogrel Bisulfate] Rash   Protonix [Pantoprazole Sodium] Rash   Venofer [Ferric Oxide] Itching and Other (See Comments)     Patient reports using Benadryl prior to doses as [facility]     Current Outpatient Medications  Medication Sig Dispense Refill   aspirin EC 81 MG tablet Take 81 mg by mouth daily.      cyclobenzaprine (FLEXERIL) 5 MG tablet Take 5 mg by mouth 3 (three) times daily as needed for muscle spasms.     fluticasone (FLONASE) 50 MCG/ACT nasal spray Place 1 spray into both nostrils at bedtime as needed for allergies.      hydrOXYzine (VISTARIL) 100 MG capsule Take 100 mg by mouth 2 (two) times daily as needed for anxiety or itching.     isosorbide mononitrate (IMDUR) 60 MG 24 hr tablet Take 1 tablet (60 mg total) by mouth in the morning and at bedtime. 180 tablet 3   lidocaine-prilocaine (EMLA) cream Apply 1 application topically every Monday, Wednesday, and Friday. Prior to dialysis     loperamide (IMODIUM A-D) 2 MG tablet Take 2 tablets (4 mg total) by mouth daily as needed for diarrhea or loose stools.     loratadine (CLARITIN) 10 MG tablet Take 1 tablet (10 mg total) by mouth daily as needed for allergies. 30 tablet 0   metoprolol tartrate (LOPRESSOR) 25 MG tablet Take 1 tablet (25 mg total) by mouth 2 (two) times daily.     multivitamin (RENA-VIT) TABS tablet Take 1 tablet by mouth daily.     nitroGLYCERIN (NITROSTAT) 0.4 MG SL tablet Place 1 tablet (0.4 mg total) under the tongue every 5 (five) minutes x 3 doses as needed for chest pain (if no relief after 2nd dose, proceed to the ED for an evaluation or call 911). 25 tablet 2   omeprazole (PRILOSEC) 20 MG capsule Take 1 capsule (20 mg total) by mouth daily. 90 capsule 3   sevelamer carbonate (RENVELA) 800 MG tablet Take 2 tablets (1,600 mg total) by mouth 3 (three) times daily with meals. 180 tablet 1   simvastatin (ZOCOR) 20 MG tablet Take 1 tablet (20 mg total) by mouth at bedtime. 90 tablet 3   triamcinolone (KENALOG) 0.025 % ointment Apply 1 application topically 2 (two) times daily as needed (itching).     triamcinolone cream  (KENALOG) 0.1 % Apply 1 application topically 2 (two) times daily as needed (itching).     No current facility-administered medications for this visit.     Past Surgical History:  Procedure Laterality Date   A/V SHUNTOGRAM Left 03/19/2019   Procedure: A/V SHUNTOGRAM;  Surgeon: Katha Cabal, MD;  Location: [place] [facility];  Service: Cardiovascular;  Laterality: Left;   ABDOMINAL HYSTERECTOMY  1992   APPENDECTOMY  06/1990  AV FISTULA PLACEMENT  07/2009   left upper arm   AV FISTULA PLACEMENT Right 09/06/2016   Procedure: RIGHT FOREARM ARTERIOVENOUS (AV) GRAFT;  Surgeon: Elam Dutch, MD;  Location: [place];  Service: Vascular;  Laterality: Right;   AV FISTULA PLACEMENT N/A 02/24/2017   Procedure: INSERTION OF ARTERIOVENOUS (AV) GORE-TEX GRAFT ARM (BRACHIAL AXILLARY);  Surgeon: Katha Cabal, MD;  Location: [facility];  Service: Vascular;  Laterality: N/A;   [place] Right 09/06/2016   Procedure: REMOVAL OF Right Arm ARTERIOVENOUS GORETEX GRAFT and Vein Patch angioplasty of brachial artery;  Surgeon: Angelia Mould, MD;  Location: [place];  Service: Vascular;  Laterality: Right;   BIOPSY  09/26/2019   Procedure: BIOPSY;  Surgeon: Rogene Houston, MD;  [facility];  Service: Endoscopy;;   COLON RESECTION  1992   COLON SURGERY     COLONOSCOPY N/A 03/09/2019   Procedure: COLONOSCOPY;  Surgeon: Rogene Houston, MD;  [facility];  Service: Endoscopy;  Laterality: N/A;   CORONARY ANGIOPLASTY WITH STENT PLACEMENT  12/15/11   "2"   CORONARY ANGIOPLASTY WITH STENT PLACEMENT  y/2013   "1; makes total of 3" (05/02/2012)   CORONARY ARTERY BYPASS GRAFT  06/13/2012   Procedure: CORONARY ARTERY BYPASS GRAFTING (CABG);  Surgeon: Grace Isaac, MD;  Location: [place];  Service: Open Heart Surgery;  Laterality: N/A;  cabg x four;  using left internal mammary artery, and left leg greater saphenous vein harvested endoscopically   CORONARY STENT INTERVENTION N/A  10/13/2016   Procedure: Coronary Stent Intervention;  Surgeon: Troy Sine, MD;  Location: [place] [facility];  Service: Cardiovascular;  Laterality: N/A;   DIALYSIS/PERMA CATHETER REMOVAL N/A 04/18/2017   Procedure: DIALYSIS/PERMA CATHETER REMOVAL;  Surgeon: Katha Cabal, MD;  Location: [place] [address] [facility];  Service: Cardiovascular;  Laterality: N/A;   DILATION AND CURETTAGE OF UTERUS     ESOPHAGOGASTRODUODENOSCOPY  01/20/2012   Procedure: ESOPHAGOGASTRODUODENOSCOPY (EGD);  Surgeon: Ladene Artist, MD,FACG;  Location: [facility] ENDOSCOPY;  Service: Endoscopy;  Laterality: N/A;   ESOPHAGOGASTRODUODENOSCOPY N/A 03/26/2013   Procedure: ESOPHAGOGASTRODUODENOSCOPY (EGD);  Surgeon: Irene Shipper, MD;  Location: [facility] ENDOSCOPY;  Service: Endoscopy;  Laterality: N/A;   ESOPHAGOGASTRODUODENOSCOPY N/A 04/30/2015   Procedure: ESOPHAGOGASTRODUODENOSCOPY (EGD);  Surgeon: Rogene Houston, MD;  [facility];  Service: Endoscopy;  Laterality: N/A;  1pm - moved to 10/20 @ 1:10   ESOPHAGOGASTRODUODENOSCOPY N/A 07/29/2016   Procedure: ESOPHAGOGASTRODUODENOSCOPY (EGD);  Surgeon: Manus Gunning, MD;  Location: [place];  Service: Gastroenterology;  Laterality: N/A;  enteroscopy   ESOPHAGOGASTRODUODENOSCOPY N/A 09/26/2019   Procedure: ESOPHAGOGASTRODUODENOSCOPY (EGD);  Surgeon: Rogene Houston, MD;  [facility];  Service: Endoscopy;  Laterality: N/A;  [place] 03/07/2019   Procedure: GIVENS CAPSULE STUDY;  Surgeon: Rogene Houston, MD;  [facility];  Service: Endoscopy;  Laterality: N/A;  7:30   INSERTION OF DIALYSIS CATHETER N/A 10/05/2020   Procedure: ABORTED TUNNELED DIALYSIS CATHETER PLACEMENT RIGHT INTERNAL JUGULAR VEIN ;  Surgeon: Virl Cagey, MD;  [facility];  Service: General;  Laterality: N/A;   INTRAOPERATIVE TRANSESOPHAGEAL ECHOCARDIOGRAM  06/13/2012   Procedure: INTRAOPERATIVE TRANSESOPHAGEAL ECHOCARDIOGRAM;  Surgeon: Grace Isaac, MD;  Location: [place];  Service: Open Heart Surgery;  Laterality: N/A;   IR DIALY SHUNT INTRO NEEDLE/INTRACATH INITIAL W/IMG LEFT Left 10/06/2020   IR FLUORO GUIDE CV LINE RIGHT  06/17/2020   IR GENERIC  HISTORICAL  07/26/2016   IR FLUORO GUIDE CV LINE RIGHT 07/26/2016 Greggory Keen, MD [facility]   IR GENERIC HISTORICAL  07/26/2016   IR US GUIDE VASC ACCESS RIGHT 07/26/2016 Greggory Keen, MD [facility]   IR GENERIC HISTORICAL  08/02/2016   IR US GUIDE VASC ACCESS RIGHT 08/02/2016 Greggory Keen, MD [facility]   IR GENERIC HISTORICAL  08/02/2016   IR FLUORO GUIDE CV LINE RIGHT 08/02/2016 Greggory Keen, MD [facility]   IR RADIOLOGY PERIPHERAL GUIDED IV START  03/28/2017   IR REMOVAL TUN CV CATH W/O FL  08/11/2020   IR THROMBECTOMY AV FISTULA W/THROMBOLYSIS INC/SHUNT/IMG LEFT Left 06/17/2020   IR US GUIDE VASC ACCESS LEFT  06/17/2020   IR US GUIDE VASC ACCESS RIGHT  03/28/2017   IR US GUIDE VASC ACCESS RIGHT  06/17/2020   LEFT HEART CATH AND CORONARY ANGIOGRAPHY N/A 09/20/2016   Procedure: Left Heart Cath and Coronary Angiography;  Surgeon: [name], MD;  Location: [place] [facility];  Service: Cardiovascular;  Laterality: N/A;   LEFT HEART CATH AND CORS/GRAFTS ANGIOGRAPHY N/A 10/13/2016   Procedure: Left Heart Cath and Cors/Grafts Angiography;  Surgeon: Troy Sine, MD;  Location: [place] [facility];  Service: Cardiovascular;  Laterality: N/A;   LEFT HEART CATH AND CORS/GRAFTS ANGIOGRAPHY N/A 07/13/2018   Procedure: LEFT HEART CATH AND CORS/GRAFTS ANGIOGRAPHY;  Surgeon: [place], Peter M, MD;  Location: [place] [facility];  Service: Cardiovascular;  Laterality: N/A;   LEFT HEART CATHETERIZATION WITH CORONARY ANGIOGRAM N/A 12/15/2011   Procedure: LEFT HEART CATHETERIZATION WITH CORONARY ANGIOGRAM;  Surgeon: Burnell Blanks, MD;  Location: [facility] CATH LAB;  Service: Cardiovascular;  Laterality: N/A;   LEFT HEART CATHETERIZATION WITH CORONARY ANGIOGRAM N/A 01/10/2012   Procedure: LEFT HEART  CATHETERIZATION WITH CORONARY ANGIOGRAM;  Surgeon: Peter M Martinique, MD;  Location: [facility] CATH LAB;  Service: Cardiovascular;  Laterality: N/A;   LEFT HEART CATHETERIZATION WITH CORONARY ANGIOGRAM N/A 06/08/2012   Procedure: LEFT HEART CATHETERIZATION WITH CORONARY ANGIOGRAM;  Surgeon: Burnell Blanks, MD;  Location: [facility] CATH LAB;  Service: Cardiovascular;  Laterality: N/A;   LEFT HEART CATHETERIZATION WITH CORONARY/GRAFT ANGIOGRAM N/A 12/10/2013   Procedure: LEFT HEART CATHETERIZATION WITH Beatrix Fetters;  Surgeon: [name], MD;  Location: [facility] CATH LAB;  Service: Cardiovascular;  Laterality: N/A;   OVARY SURGERY     ovarian cancer   POLYPECTOMY  03/09/2019   Procedure: POLYPECTOMY;  Surgeon: Rogene Houston, MD;  [facility];  Service: Endoscopy;;  cecal    POLYPECTOMY N/A 09/26/2019   Procedure: DUODENAL POLYPECTOMY;  Surgeon: Rogene Houston, MD;  [facility];  Service: Endoscopy;  Laterality: N/A;   REVISION OF ARTERIOVENOUS GORETEX GRAFT N/A 02/24/2017   Procedure: REVISION OF ARTERIOVENOUS GORETEX GRAFT (RESECTION);  Surgeon: Katha Cabal, MD;  Location: [facility];  Service: Vascular;  Laterality: N/A;   REVISON OF ARTERIOVENOUS FISTULA Left 06/19/2020   Procedure: REVISION OF LEFT UPPER ARM AV GRAFT WITH INTERPOSITION JUMP GRAFT USING 6MM GORE LIMB;  Surgeon: Marty Heck, MD;  Location: [place];  Service: Vascular;  Laterality: Left;   SHUNTOGRAM N/A 10/15/2013   Procedure: Fistulogram;  Surgeon: Serafina [place], MD;  Location: [facility] CATH LAB;  Service: Cardiovascular;  Laterality: N/A;   THROMBECTOMY / ARTERIOVENOUS GRAFT REVISION  2011   left upper arm   TUBAL LIGATION  1980's   UPPER EXTREMITY ANGIOGRAPHY Bilateral 12/06/2016   Procedure: Upper Extremity Angiography;  Surgeon: Hortencia Pilar  G, MD;  Location: [place] [facility];  Service: Cardiovascular;  Laterality: Bilateral;   UPPER EXTREMITY INTERVENTION Left 06/06/2017    Procedure: UPPER EXTREMITY INTERVENTION;  Surgeon: Katha Cabal, MD;  Location: [place] [facility];  Service: Cardiovascular;  Laterality: Left;     Allergies  Allergen Reactions   Aspirin Other (See Comments)    High Doses Mess up her stomach; "makes my bowels have blood in them". Takes 81 mg EC Aspirin    Penicillins Other (See Comments)    SYNCOPE? , "makes me real weak when I take it; like I'll pass out"  Has patient had a PCN reaction causing immediate rash, facial/tongue/throat swelling, SOB or lightheadedness with hypotension: Yes Has patient had a PCN reaction causing severe rash involving mucus membranes or skin necrosis: no Has patient had a PCN reaction that required hospitalization no Has patient had a PCN reaction occurring within the last 10 years: no If all of the above   Amlodipine Swelling   Bactrim [Sulfamethoxazole-Trimethoprim] Rash   Contrast Media [Iodinated Diagnostic Agents] Itching   Iron Itching and Other (See Comments)    "they gave me iron in dialysis; had to give me Benadryl cause I had to have the iron" (05/02/2012)   Nitrofurantoin Hives   Tylenol [Acetaminophen] Itching and Other (See Comments)    Makes her feet on fire per pt   Gabapentin Other (See Comments)    Unknown reaction   Hydralazine Itching    Has tolerated while inpatient   Levofloxacin    No Known Allergies    Ranexa [Ranolazine] Other (See Comments)    Myoclonus-hospitalized    Dexilant [Dexlansoprazole] Other (See Comments)    Upset stomach   Levaquin [Levofloxacin In D5w] Rash   Morphine And Related Itching and Other (See Comments)    Itching in feet   Plavix [Clopidogrel Bisulfate] Rash   Protonix [Pantoprazole Sodium] Rash   Venofer [Ferric Oxide] Itching and Other (See Comments)    Patient reports using Benadryl prior to doses as [place]      Family History  Problem Relation Age of Onset   Heart disease Mother        Heart Disease before age 48    Hyperlipidemia Mother    Hypertension Mother    Diabetes Mother    Heart attack Mother    Heart disease Father        Heart Disease before age 42   Hyperlipidemia Father    Hypertension Father    Diabetes Father    Diabetes Sister    Hypertension Sister    Diabetes Brother    Hyperlipidemia Brother    Heart attack Brother    Hypertension Sister    Heart attack Brother    Colon cancer Child 43   Other Other        noncontributory for early CAD   Esophageal cancer Neg Hx    Liver disease Neg Hx    Kidney disease Neg Hx    Colon polyps Neg Hx      Social History Ms. Haberkorn reports that she has never smoked. She has never used smokeless tobacco. Ms. Schriever reports no history of alcohol use.   Review of Systems CONSTITUTIONAL: No weight loss, fever, chills, weakness or fatigue.  HEENT: Eyes: No visual loss, blurred vision, double vision or yellow sclerae.No hearing loss, sneezing, congestion, runny nose or sore throat.  SKIN: No rash or itching.  CARDIOVASCULAR: per hpi RESPIRATORY: No shortness of  breath, cough or sputum.  GASTROINTESTINAL: No anorexia, nausea, vomiting or diarrhea. No abdominal pain or blood.  GENITOURINARY: No burning on urination, no polyuria NEUROLOGICAL: No headache, dizziness, syncope, paralysis, ataxia, numbness or tingling in the extremities. No change in bowel or bladder control.  MUSCULOSKELETAL: No muscle, back pain, joint pain or stiffness.  LYMPHATICS: No enlarged nodes. No history of splenectomy.  PSYCHIATRIC: No history of depression or anxiety.  ENDOCRINOLOGIC: No reports of sweating, cold or heat intolerance. No polyuria or polydipsia.  Marland Kitchen   Physical Examination Today's Vitals   12/22/20 1054  BP: (!) 130/58  Pulse: 70  SpO2: 100%  Weight: 131 lb 9.6 oz (59.7 kg)  Height: 5\' 2"  (1.575 m)   Body mass index is 24.07 kg/m.  Gen: resting comfortably, no acute distress HEENT: no scleral icterus, pupils equal round and reactive, no  palptable cervical adenopathy,  CV: RRR< no m/r/g, no jvd Resp: Clear to auscultation bilaterally GI: abdomen is soft, non-tender, non-distended, normal bowel sounds, no hepatosplenomegaly MSK: extremities are warm, no edema.  Skin: warm, no rash Neuro:  no focal deficits Psych: appropriate affect      Assessment and Plan   1.CAD - chronic stable symptoms, antianginal therpay has been limited due to low bp's and side effects - continue imdur at current dose  2. Syncope - secondary to low bp's with HD, will try midodrine 5mg  on days prior to HD  3. AFib - continue current meds, not on anticoag due to history of prior GI bleed     Arnoldo Lenis, M.D

## 2020-12-22 NOTE — Patient Instructions (Signed)
Medication Instructions:   START Midodrine 5 mg on Monday, Wednesday, and Friday morning before dialysis   *If you need a refill on your cardiac medications before your next appointment, please call your pharmacy*   Lab Work: None today  If you have labs (blood work) drawn today and your tests are completely normal, you will receive your results only by: [place] (if you have MyChart) OR A paper copy in the mail If you have any lab test that is abnormal or we need to change your treatment, we will call you to review the results.   Testing/Procedures: None today    Follow-Up: At [facility], you and your health needs are our priority.  As part of our continuing mission to provide you with exceptional heart care, we have created designated Provider Care Teams.  These Care Teams include your primary Cardiologist (physician) and Advanced Practice Providers (APPs -  Physician Assistants and Nurse Practitioners) who all work together to provide you with the care you need, when you need it.  We recommend signing up for the patient portal called "MyChart".  Sign up information is provided on this After Visit Summary.  MyChart is used to connect with patients for Virtual Visits (Telemedicine).  Patients are able to view lab/test results, encounter notes, upcoming appointments, etc.  Non-urgent messages can be sent to your provider as well.   To learn more about what you can do with MyChart, go to [email or web address].    Your next appointment:   6 month(s)  The format for your next appointment:   In Person  Provider:   Carlyle Dolly, MD   Other Instructions None

## 2021-01-05 ENCOUNTER — Ambulatory Visit: Payer: Self-pay | Admitting: Family Medicine

## 2021-01-07 ENCOUNTER — Ambulatory Visit: Payer: Medicare HMO | Admitting: Nurse Practitioner

## 2021-01-07 ENCOUNTER — Encounter: Payer: Self-pay | Admitting: Nurse Practitioner

## 2021-01-07 ENCOUNTER — Other Ambulatory Visit: Payer: Self-pay

## 2021-01-07 VITALS — BP 177/72 | HR 64 | Ht 62.0 in | Wt 124.0 lb

## 2021-01-07 DIAGNOSIS — E039 Hypothyroidism, unspecified: Secondary | ICD-10-CM

## 2021-01-07 NOTE — Patient Instructions (Signed)
Hypothyroidism  Hypothyroidism is when the thyroid gland does not make enough of certain hormones (it is underactive). The thyroid gland is a small gland located in the lower front part of the neck, just in front of the windpipe (trachea). This gland makes hormones that help control how the body uses food for energy (metabolism) as well as how the heart and brain function. These hormones also play a role in keeping your bones strong. When the thyroid is underactive, it produces too little of the hormones thyroxine (T4) and triiodothyronine (T3). What are the causes? This condition may be caused by: Hashimoto's disease. This is a disease in which the body's disease-fighting system (immune system) attacks the thyroid gland. This is the most common cause. Viral infections. Pregnancy. Certain medicines. Birth defects. Past radiation treatments to the head or neck for cancer. Past treatment with radioactive iodine. Past exposure to radiation in the environment. Past surgical removal of part or all of the thyroid. Problems with a gland in the center of the brain (pituitary gland). Lack of enough iodine in the diet. What increases the risk? You are more likely to develop this condition if: You are female. You have a family history of thyroid conditions. You use a medicine called lithium. You take medicines that affect the immune system (immunosuppressants). What are the signs or symptoms? Symptoms of this condition include: Feeling as though you have no energy (lethargy). Not being able to tolerate cold. Weight gain that is not explained by a change in diet or exercise habits. Lack of appetite. Dry skin. Coarse hair. Menstrual irregularity. Slowing of thought processes. Constipation. Sadness or depression. How is this diagnosed? This condition may be diagnosed based on: Your symptoms, your medical history, and a physical exam. Blood tests. You may also have imaging tests, such as an  ultrasound or MRI. How is this treated? This condition is treated with medicine that replaces the thyroid hormones that your body does not make. After you begin treatment, it may take several weeksfor symptoms to go away. Follow these instructions at home: Take over-the-counter and prescription medicines only as told by your health care provider. If you start taking any new medicines, tell your health care provider. Keep all follow-up visits as told by your health care provider. This is important. As your condition improves, your dosage of thyroid hormone medicine may change. You will need to have blood tests regularly so that your health care provider can monitor your condition. Contact a health care provider if: Your symptoms do not get better with treatment. You are taking thyroid hormone replacement medicine and you: Sweat a lot. Have tremors. Feel anxious. Lose weight rapidly. Cannot tolerate heat. Have emotional swings. Have diarrhea. Feel weak. Get help right away if you have: Chest pain. An irregular heartbeat. A rapid heartbeat. Difficulty breathing. Summary Hypothyroidism is when the thyroid gland does not make enough of certain hormones (it is underactive). When the thyroid is underactive, it produces too little of the hormones thyroxine (T4) and triiodothyronine (T3). The most common cause is Hashimoto's disease, a disease in which the body's disease-fighting system (immune system) attacks the thyroid gland. The condition can also be caused by viral infections, medicine, pregnancy, or past radiation treatment to the head or neck. Symptoms may include weight gain, dry skin, constipation, feeling as though you do not have energy, and not being able to tolerate cold. This condition is treated with medicine to replace the thyroid hormones that your body does not make. This information   is not intended to replace advice given to you by your health care provider. Make sure you  discuss any questions you have with your healthcare provider. Document Revised: 03/27/2020 Document Reviewed: 03/12/2020 Elsevier Patient Education  2022 [employer]  

## 2021-01-07 NOTE — Progress Notes (Signed)
Endocrinology Consult Note                                         01/07/2021, 4:30 PM  Subjective:   Subjective    Shawna [name] is a 80 y.o.-year-old female patient being seen in consultation for hypothyroidism referred by Practice, [facility].   Past Medical History:  Diagnosis Date   Acute on chronic respiratory failure with hypoxia ([place]) 10/10/2016   Anxiety    Arthritis    AVM (arteriovenous malformation) of colon    CAD (coronary artery disease)    a. s/p CABG in 2013 b. DES to D1 in 10/2016. c. cath in 07/2018 showing patent grafts with occlusion of D1 at prior stent site and progression of PDA disease --> medical management recommended   Carotid artery disease ([place])    a. 94-17% LICA, 10/812    Chronic anemia    Chronic bronchitis (HCC)    Chronic diastolic CHF (congestive heart failure) ([place])    a. 02/2012 Echo EF 60-65%, nl wall motion, Gr 1 DD, mod MR   Colon cancer ([place]) 1992   Esophageal stricture    ESRD on hemodialysis ([place])    ESRD due to HTN, started dialysis 2011 and gets HD at [facility] with Dr Hinda Lenis on MWF schedule.  Access is LUA AVF as of Sept 2014.    GERD (gastroesophageal reflux disease)    High cholesterol 12/2011   History of blood transfusion 07/2011; 12/2011; 01/2012 X 2; 04/2012   History of gout    History of lower GI bleeding    Hypertension    Iron deficiency anemia    Mitral regurgitation    a. Moderate by echo, 02/2012   Myocardial infarction [facility])    NSVT (nonsustained ventricular tachycardia) ([place])    Ovarian cancer ([place]) 1992   PAF (paroxysmal atrial fibrillation) ([place])    Pneumonia ~ 2009   PUD (peptic ulcer disease)    TIA (transient ischemic attack)     Past Surgical History:  Procedure Laterality Date   A/V SHUNTOGRAM Left 03/19/2019   Procedure: A/V SHUNTOGRAM;  Surgeon: Katha Cabal, MD;  Location: [place] CV LAB;  Service: Cardiovascular;   Laterality: Left;   ABDOMINAL HYSTERECTOMY  1992   APPENDECTOMY  06/1990   AV FISTULA PLACEMENT  07/2009   left upper arm   AV FISTULA PLACEMENT Right 09/06/2016   Procedure: RIGHT FOREARM ARTERIOVENOUS (AV) GRAFT;  Surgeon: Elam Dutch, MD;  Location: [place];  Service: Vascular;  Laterality: Right;   AV FISTULA PLACEMENT N/A 02/24/2017   Procedure: INSERTION OF ARTERIOVENOUS (AV) GORE-TEX GRAFT ARM (BRACHIAL AXILLARY);  Surgeon: Katha Cabal, MD;  Location: [facility] ORS;  Service: Vascular;  Laterality: N/A;   Advance Right 09/06/2016   Procedure: REMOVAL OF Right Arm ARTERIOVENOUS GORETEX GRAFT and Vein Patch angioplasty of brachial artery;  Surgeon: Angelia Mould, MD;  Location: [place];  Service: Vascular;  Laterality: Right;   BIOPSY  09/26/2019  Procedure: BIOPSY;  Surgeon: Rogene Houston, MD;  [facility];  Service: Endoscopy;;   COLON RESECTION  1992   COLON SURGERY     COLONOSCOPY N/A 03/09/2019   Procedure: COLONOSCOPY;  Surgeon: Rogene Houston, MD;  [facility];  Service: Endoscopy;  Laterality: N/A;   CORONARY ANGIOPLASTY WITH STENT PLACEMENT  12/15/11   "2"   CORONARY ANGIOPLASTY WITH STENT PLACEMENT  y/2013   "1; makes total of 3" (05/02/2012)   CORONARY ARTERY BYPASS GRAFT  06/13/2012   Procedure: CORONARY ARTERY BYPASS GRAFTING (CABG);  Surgeon: Grace Isaac, MD;  Location: [place];  Service: Open Heart Surgery;  Laterality: N/A;  cabg x four;  using left internal mammary artery, and left leg greater saphenous vein harvested endoscopically   CORONARY STENT INTERVENTION N/A 10/13/2016   Procedure: Coronary Stent Intervention;  Surgeon: Troy Sine, MD;  Location: [place] CV LAB;  Service: Cardiovascular;  Laterality: N/A;   DIALYSIS/PERMA CATHETER REMOVAL N/A 04/18/2017   Procedure: DIALYSIS/PERMA CATHETER REMOVAL;  Surgeon: Katha Cabal, MD;  Location: [place] CV LAB;  Service: Cardiovascular;  Laterality: N/A;    DILATION AND CURETTAGE OF UTERUS     ESOPHAGOGASTRODUODENOSCOPY  01/20/2012   Procedure: ESOPHAGOGASTRODUODENOSCOPY (EGD);  Surgeon: Ladene Artist, MD,FACG;  Location: [facility] ENDOSCOPY;  Service: Endoscopy;  Laterality: N/A;   ESOPHAGOGASTRODUODENOSCOPY N/A 03/26/2013   Procedure: ESOPHAGOGASTRODUODENOSCOPY (EGD);  Surgeon: Irene Shipper, MD;  Location: [facility] ENDOSCOPY;  Service: Endoscopy;  Laterality: N/A;   ESOPHAGOGASTRODUODENOSCOPY N/A 04/30/2015   Procedure: ESOPHAGOGASTRODUODENOSCOPY (EGD);  Surgeon: Rogene Houston, MD;  [facility];  Service: Endoscopy;  Laterality: N/A;  1pm - moved to 10/20 @ 1:10   ESOPHAGOGASTRODUODENOSCOPY N/A 07/29/2016   Procedure: ESOPHAGOGASTRODUODENOSCOPY (EGD);  Surgeon: Manus Gunning, MD;  Location: [place];  Service: Gastroenterology;  Laterality: N/A;  enteroscopy   ESOPHAGOGASTRODUODENOSCOPY N/A 09/26/2019   Procedure: ESOPHAGOGASTRODUODENOSCOPY (EGD);  Surgeon: Rogene Houston, MD;  [facility];  Service: Endoscopy;  Laterality: N/A;  [place] 03/07/2019   Procedure: GIVENS CAPSULE STUDY;  Surgeon: Rogene Houston, MD;  [facility];  Service: Endoscopy;  Laterality: N/A;  7:30   INSERTION OF DIALYSIS CATHETER N/A 10/05/2020   Procedure: ABORTED TUNNELED DIALYSIS CATHETER PLACEMENT RIGHT INTERNAL JUGULAR VEIN ;  Surgeon: Virl Cagey, MD;  [facility]: [facility];  Service: General;  Laterality: N/A;   INTRAOPERATIVE TRANSESOPHAGEAL ECHOCARDIOGRAM  06/13/2012   Procedure: INTRAOPERATIVE TRANSESOPHAGEAL ECHOCARDIOGRAM;  Surgeon: Grace Isaac, MD;  Location: [place];  Service: Open Heart Surgery;  Laterality: N/A;   IR DIALY SHUNT INTRO NEEDLE/INTRACATH INITIAL W/IMG LEFT Left 10/06/2020   IR FLUORO GUIDE CV LINE RIGHT  06/17/2020   IR GENERIC HISTORICAL  07/26/2016   IR FLUORO GUIDE CV LINE RIGHT 07/26/2016 Greggory Keen, MD [facility]   IR GENERIC HISTORICAL  07/26/2016   IR US GUIDE VASC  ACCESS RIGHT 07/26/2016 Greggory Keen, MD [facility]   IR GENERIC HISTORICAL  08/02/2016   IR US GUIDE VASC ACCESS RIGHT 08/02/2016 Greggory Keen, MD [facility]   IR GENERIC HISTORICAL  08/02/2016   IR FLUORO GUIDE CV LINE RIGHT 08/02/2016 Greggory Keen, MD [facility]   IR RADIOLOGY PERIPHERAL GUIDED IV START  03/28/2017   IR REMOVAL TUN CV CATH W/O FL  08/11/2020   IR THROMBECTOMY AV FISTULA W/THROMBOLYSIS INC/SHUNT/IMG LEFT Left 06/17/2020   IR US GUIDE VASC ACCESS LEFT  06/17/2020  IR US GUIDE VASC ACCESS RIGHT  03/28/2017   IR US GUIDE VASC ACCESS RIGHT  06/17/2020   LEFT HEART CATH AND CORONARY ANGIOGRAPHY N/A 09/20/2016   Procedure: Left Heart Cath and Coronary Angiography;  Surgeon: [name], MD;  Location: [place] CV LAB;  Service: Cardiovascular;  Laterality: N/A;   LEFT HEART CATH AND CORS/GRAFTS ANGIOGRAPHY N/A 10/13/2016   Procedure: Left Heart Cath and Cors/Grafts Angiography;  Surgeon: Troy Sine, MD;  Location: [place] CV LAB;  Service: Cardiovascular;  Laterality: N/A;   LEFT HEART CATH AND CORS/GRAFTS ANGIOGRAPHY N/A 07/13/2018   Procedure: LEFT HEART CATH AND CORS/GRAFTS ANGIOGRAPHY;  Surgeon: Martinique, Peter M, MD;  Location: [place] CV LAB;  Service: Cardiovascular;  Laterality: N/A;   LEFT HEART CATHETERIZATION WITH CORONARY ANGIOGRAM N/A 12/15/2011   Procedure: LEFT HEART CATHETERIZATION WITH CORONARY ANGIOGRAM;  Surgeon: Burnell Blanks, MD;  Location: [facility] CATH LAB;  Service: Cardiovascular;  Laterality: N/A;   LEFT HEART CATHETERIZATION WITH CORONARY ANGIOGRAM N/A 01/10/2012   Procedure: LEFT HEART CATHETERIZATION WITH CORONARY ANGIOGRAM;  Surgeon: Peter M Martinique, MD;  Location: [facility] CATH LAB;  Service: Cardiovascular;  Laterality: N/A;   LEFT HEART CATHETERIZATION WITH CORONARY ANGIOGRAM N/A 06/08/2012   Procedure: LEFT HEART CATHETERIZATION WITH CORONARY ANGIOGRAM;  Surgeon: Burnell Blanks, MD;  Location: [facility] CATH LAB;  Service: Cardiovascular;   Laterality: N/A;   LEFT HEART CATHETERIZATION WITH CORONARY/GRAFT ANGIOGRAM N/A 12/10/2013   Procedure: LEFT HEART CATHETERIZATION WITH Beatrix Fetters;  Surgeon: [name], MD;  Location: [facility] CATH LAB;  Service: Cardiovascular;  Laterality: N/A;   OVARY SURGERY     ovarian cancer   POLYPECTOMY  03/09/2019   Procedure: POLYPECTOMY;  Surgeon: Rogene Houston, MD;  [facility];  Service: Endoscopy;;  cecal    POLYPECTOMY N/A 09/26/2019   Procedure: DUODENAL POLYPECTOMY;  Surgeon: Rogene Houston, MD;  [facility];  Service: Endoscopy;  Laterality: N/A;   REVISION OF ARTERIOVENOUS GORETEX GRAFT N/A 02/24/2017   Procedure: REVISION OF ARTERIOVENOUS GORETEX GRAFT (RESECTION);  Surgeon: Katha Cabal, MD;  Location: [facility] ORS;  Service: Vascular;  Laterality: N/A;   REVISON OF ARTERIOVENOUS FISTULA Left 06/19/2020   Procedure: REVISION OF LEFT UPPER ARM AV GRAFT WITH INTERPOSITION JUMP GRAFT USING 6MM GORE LIMB;  Surgeon: Marty Heck, MD;  Location: [place];  Service: Vascular;  Laterality: Left;   SHUNTOGRAM N/A 10/15/2013   Procedure: Fistulogram;  Surgeon: Serafina Mitchell, MD;  Location: [facility] CATH LAB;  Service: Cardiovascular;  Laterality: N/A;   THROMBECTOMY / ARTERIOVENOUS GRAFT REVISION  2011   left upper arm   TUBAL LIGATION  1980's   UPPER EXTREMITY ANGIOGRAPHY Bilateral 12/06/2016   Procedure: Upper Extremity Angiography;  Surgeon: Katha Cabal, MD;  Location: [place] CV LAB;  Service: Cardiovascular;  Laterality: Bilateral;   UPPER EXTREMITY INTERVENTION Left 06/06/2017   Procedure: UPPER EXTREMITY INTERVENTION;  Surgeon: Katha Cabal, MD;  Location: [place] CV LAB;  Service: Cardiovascular;  Laterality: Left;    Social History   Socioeconomic History   Marital status: Married    Spouse name: [name]   Number of children: Not on file   Years of education: Not on file   Highest education level: Not on file   Occupational History   Not on file  Tobacco Use   Smoking status: Never   Smokeless tobacco: Never  Vaping Use   Vaping Use: Never used  Substance and Sexual  Activity   Alcohol use: No    Alcohol/week: 0.0 standard drinks   Drug use: No   Sexual activity: Yes    Birth control/protection: Surgical  Other Topics Concern   Not on file  Social History Narrative   Lives in [place], [place] with husband.  Dialysis pt - mwf.   Social Determinants of Health   Financial Resource Strain: Not on file  Food Insecurity: Not on file  Transportation Needs: Not on file  Physical Activity: Not on file  Stress: Not on file  Social Connections: Not on file    Family History  Problem Relation Age of Onset   Heart disease Mother        Heart Disease before age 65   Hyperlipidemia Mother    Hypertension Mother    Diabetes Mother    Heart attack Mother    Heart disease Father        Heart Disease before age 48   Hyperlipidemia Father    Hypertension Father    Diabetes Father    Diabetes Sister    Hypertension Sister    Diabetes Brother    Hyperlipidemia Brother    Heart attack Brother    Hypertension Sister    Heart attack Brother    Colon cancer Child 22   Other Other        noncontributory for early CAD   Esophageal cancer Neg Hx    Liver disease Neg Hx    Kidney disease Neg Hx    Colon polyps Neg Hx     Outpatient Encounter Medications as of 01/07/2021  Medication Sig   aspirin EC 81 MG tablet Take 81 mg by mouth daily.    atorvastatin (LIPITOR) 10 MG tablet Take 10 mg by mouth daily.   cyclobenzaprine (FLEXERIL) 5 MG tablet Take 5 mg by mouth 3 (three) times daily as needed for muscle spasms.   fluticasone (FLONASE) 50 MCG/ACT nasal spray Place 1 spray into both nostrils at bedtime as needed for allergies.    hydrOXYzine (VISTARIL) 100 MG capsule Take 100 mg by mouth 2 (two) times daily as needed for anxiety or itching.   isosorbide mononitrate (IMDUR) 60 MG 24 hr tablet Take 1  tablet (60 mg total) by mouth in the morning and at bedtime.   lidocaine-prilocaine (EMLA) cream Apply 1 application topically every Monday, Wednesday, and Friday. Prior to dialysis   loperamide (IMODIUM A-D) 2 MG tablet Take 2 tablets (4 mg total) by mouth daily as needed for diarrhea or loose stools.   loratadine (CLARITIN) 10 MG tablet Take 1 tablet (10 mg total) by mouth daily as needed for allergies.   metoprolol tartrate (LOPRESSOR) 25 MG tablet Take 1 tablet (25 mg total) by mouth 2 (two) times daily.   midodrine (PROAMATINE) 5 MG tablet Take Monday, Wednesday, Friday morning BEFORE dialysis   multivitamin (RENA-VIT) TABS tablet Take 1 tablet by mouth daily.   nitroGLYCERIN (NITROSTAT) 0.4 MG SL tablet Place 1 tablet (0.4 mg total) under the tongue every 5 (five) minutes x 3 doses as needed for chest pain (if no relief after 2nd dose, proceed to the ED for an evaluation or call 911).   omeprazole (PRILOSEC) 20 MG capsule Take 1 capsule (20 mg total) by mouth daily.   sevelamer carbonate (RENVELA) 800 MG tablet Take 2 tablets (1,600 mg total) by mouth 3 (three) times daily with meals.   triamcinolone (KENALOG) 0.025 % ointment Apply 1 application topically 2 (two) times daily as needed (  itching).   triamcinolone cream (KENALOG) 0.1 % Apply 1 application topically 2 (two) times daily as needed (itching).   No facility-administered encounter medications on file as of 01/07/2021.    ALLERGIES: Allergies  Allergen Reactions   Aspirin Other (See Comments)    High Doses Mess up her stomach; "makes my bowels have blood in them". Takes 81 mg EC Aspirin    Penicillins Other (See Comments)    SYNCOPE? , "makes me real weak when I take it; like I'll pass out"  Has patient had a PCN reaction causing immediate rash, facial/tongue/throat swelling, SOB or lightheadedness with hypotension: Yes Has patient had a PCN reaction causing severe rash involving mucus membranes or skin necrosis: no Has  patient had a PCN reaction that required hospitalization no Has patient had a PCN reaction occurring within the last 10 years: no If all of the above   Amlodipine Swelling   Bactrim [Sulfamethoxazole-Trimethoprim] Rash   Contrast Media [Iodinated Diagnostic Agents] Itching   Iron Itching and Other (See Comments)    "they gave me iron in dialysis; had to give me Benadryl cause I had to have the iron" (05/02/2012)   Nitrofurantoin Hives   Tylenol [Acetaminophen] Itching and Other (See Comments)    Makes her feet on fire per pt   Gabapentin Other (See Comments)    Unknown reaction   Hydralazine Itching    Has tolerated while inpatient   Levofloxacin    No Known Allergies    Ranexa [Ranolazine] Other (See Comments)    Myoclonus-hospitalized    Dexilant [Dexlansoprazole] Other (See Comments)    Upset stomach   Levaquin [Levofloxacin In D5w] Rash   Morphine And Related Itching and Other (See Comments)    Itching in feet   Plavix [Clopidogrel Bisulfate] Rash   Protonix [Pantoprazole Sodium] Rash   Venofer [Ferric Oxide] Itching and Other (See Comments)    Patient reports using Benadryl prior to doses as [place]: Immunization History  Administered Date(s) Administered   Influenza-Unspecified 03/11/2012   Moderna Sars-Covid-2 Vaccination 09/04/2019, 10/04/2019   Pneumococcal Polysaccharide-23 05/07/2020   Pneumococcal-Unspecified 03/11/2008   Tdap 01/31/2018     HPI   Shawna [name]  is a patient with the above medical history. she was diagnosed with hypothyroidism recently during a hospitalization, which prompted her referral here.  She is a resident at a nursing home and her husband is accompanying her today.  She is not currently on any medication for her thyroid nor has she taken anything for her thyroid in the past.    I reviewed patient's thyroid tests:  Lab Results  Component Value Date   TSH 5.174 (H) 12/16/2020   TSH 16.122 (H) 12/15/2020    TSH 9.193 (H) 12/03/2020   TSH 9.938 (H) 11/09/2020   TSH 7.243 (H) 08/30/2020   TSH 5.792 (H) 06/14/2020   TSH 1.536 05/19/2016   TSH 10.500 (H) 05/09/2016   TSH 0.999 09/08/2015   TSH 2.864 08/24/2014   FREET4 1.08 12/16/2020   FREET4 1.14 (H) 12/15/2020   FREET4 0.98 12/03/2020   FREET4 0.80 11/09/2020   FREET4 0.66 06/15/2020   FREET4 0.72 05/09/2016     Patient denies any definitive symptoms associated with hypothyroidism aside from dry skin and occasional feelings of a lump in her throat or difficulty swallowing.  Pt denies feeling nodules in neck, hoarseness, odynophagia, SOB with lying down.  she denies family history of thyroid disorders.  No family history of  thyroid cancer.  No history of radiation therapy to head or neck.  No recent use of iodine supplements.  Denies use of Biotin containing supplements.  She has never had any imaging of her thyroid in the past.  I reviewed her chart and she also has a history of Amiodarone use a few years back and is an ESRD patient on dialysis.   ROS:  Constitutional: no weight gain/loss, no fatigue, no subjective hyperthermia, no subjective hypothermia Eyes: no blurry vision, no xerophthalmia ENT: no sore throat, no nodules palpated in throat, intermittent dysphagia, no odynophagia, no hoarseness Cardiovascular: no chest pain, no SOB, no palpitations, no leg swelling Respiratory: no cough, no SOB Gastrointestinal: no nausea/vomiting/diarrhea Musculoskeletal: no muscle/joint aches, walks with cane Skin: no rashes, + dry skin Neurological: no tremors, no numbness, no tingling, no dizziness Psychiatric: no depression, no anxiety   Objective:   Objective     BP (!) 177/72   Pulse 64   Ht 5\' 2"  (1.575 m)   Wt 124 lb (56.2 kg)   BMI 22.68 kg/m  Wt Readings from Last 3 Encounters:  01/07/21 124 lb (56.2 kg)  12/22/20 131 lb 9.6 oz (59.7 kg)  12/18/20 121 lb 4.1 oz (55 kg)    BP Readings from Last 3 Encounters:   01/07/21 (!) 177/72  12/22/20 (!) 130/58  12/18/20 (!) 143/43      Physical Exam- Limited  Constitutional:  Body mass index is 22.68 kg/m. , not in acute distress, normal state of mind Eyes:  EOMI, no exophthalmos Neck: Supple Thyroid: No gross goiter Cardiovascular: RRR, + murmurs, rubs, or gallops, no edema Respiratory: Adequate breathing efforts, no crackles, rales, rhonchi, or wheezing Musculoskeletal: no gross deformities, strength intact in all four extremities, no gross restriction of joint movements, walks with cane Skin:  no rashes, no hyperemia Neurological: no tremor with outstretched hands   CMP ( most recent) CMP     Component Value Date/Time   NA 135 12/18/2020 1425   K 4.3 12/18/2020 1425   CL 95 (L) 12/18/2020 1425   CO2 29 12/18/2020 1425   GLUCOSE 92 12/18/2020 1425   BUN 19 12/18/2020 1425   CREATININE 5.51 (H) 12/18/2020 1425   CREATININE 6.57 (H) 03/05/2019 1159   CALCIUM 8.4 (L) 12/18/2020 1425   CALCIUM 8.1 (L) 05/29/2013 1814   PROT 6.6 12/18/2020 1425   ALBUMIN 3.1 (L) 12/18/2020 1425   AST 27 12/18/2020 1425   ALT 17 12/18/2020 1425   ALKPHOS 52 12/18/2020 1425   BILITOT 0.5 12/18/2020 1425   GFRNONAA 7 (L) 12/18/2020 1425   GFRNONAA 6 (L) 03/05/2019 1159   GFRAA 9 (L) 02/12/2020 1712   GFRAA 6 (L) 03/05/2019 1159     Diabetic Labs (most recent): Lab Results  Component Value Date   HGBA1C 4.3 (L) 11/09/2020   HGBA1C 4.8 06/12/2012     Lipid Panel ( most recent) Lipid Panel     Component Value Date/Time   CHOL 123 11/10/2020 0933   TRIG 87 11/10/2020 0933   HDL 58 11/10/2020 0933   CHOLHDL 2.1 11/10/2020 0933   VLDL 17 11/10/2020 0933   LDLCALC 48 11/10/2020 0933       Lab Results  Component Value Date   TSH 5.174 (H) 12/16/2020   TSH 16.122 (H) 12/15/2020   TSH 9.193 (H) 12/03/2020   TSH 9.938 (H) 11/09/2020   TSH 7.243 (H) 08/30/2020   TSH 5.792 (H) 06/14/2020   TSH 1.536 05/19/2016   TSH  10.500 (H) 05/09/2016    TSH 0.999 09/08/2015   TSH 2.864 08/24/2014   FREET4 1.08 12/16/2020   FREET4 1.14 (H) 12/15/2020   FREET4 0.98 12/03/2020   FREET4 0.80 11/09/2020   FREET4 0.66 06/15/2020   FREET4 0.72 05/09/2016      Assessment & Plan:   ASSESSMENT / PLAN:  1. Hypothyroidism-unspecified  Patient has newly diagnosed hypothyroidism.  On physical exam, patient does not have gross goiter, thyroid nodules, or neck compression symptoms.  - We discussed about correct intake of levothyroxine, at fasting, with water, separated by at least 30 minutes from breakfast, and separated by more than 4 hours from calcium, iron, multivitamins, acid reflux medications (PPIs). -Patient is made aware of the fact that thyroid hormone replacement is needed for life, dose to be adjusted by periodic monitoring of thyroid function tests.  - Will check more comprehensive thyroid tests before next visit: TSH, free T4, FT3 and antibody testing for autoimmune thyroid dysfunction.  -Based on her report of feeling a lump in her throat, will obtain baseline thyroid ultrasound to assess anatomy     - Time spent with the patient: 45 minutes, of which >50% was spent in obtaining information about her symptoms, reviewing her previous labs, evaluations, and treatments, counseling her about her hypothyroidism, and developing a plan to confirm the diagnosis and long term treatment as necessary. Please refer to "Patient Self Inventory" in the Media tab for reviewed elements of pertinent patient history.  [name] participated in the discussions, expressed understanding, and voiced agreement with the above plans.  All questions were answered to her satisfaction. she is encouraged to contact clinic should she have any questions or concerns prior to her return visit.   FOLLOW UP PLAN:  Return in about 3 weeks (around 01/28/2021) for Thyroid follow up, Previsit labs, thyroid ultrasound.  Rayetta Pigg, [facility]  [facility] [address] [place], [place] [address] Phone: [phone] Fax: [phone]  01/07/2021, 4:30 PM

## 2021-01-21 ENCOUNTER — Other Ambulatory Visit: Payer: Self-pay

## 2021-01-21 ENCOUNTER — Ambulatory Visit (HOSPITAL_COMMUNITY)
Admission: RE | Admit: 2021-01-21 | Discharge: 2021-01-21 | Disposition: A | Discharge status: Home / Self Care | Payer: Medicare HMO | Source: Ambulatory Visit | Attending: Nurse Practitioner | Admitting: Nurse Practitioner

## 2021-01-21 DIAGNOSIS — E039 Hypothyroidism, unspecified: Secondary | ICD-10-CM | POA: Diagnosis not present

## 2021-01-21 IMAGING — US US THYROID
1 series · 14 of 25 positions shown · non-contrast
Comparison: None.

CLINICAL DATA: Hypothyroidism

EXAM:
THYROID ULTRASOUND
TECHNIQUE: Ultrasound examination of the thyroid gland and adjacent soft
tissues was performed.

[Series 1: us thyroid · 14 of 119 slices shown]
[im 1/119]
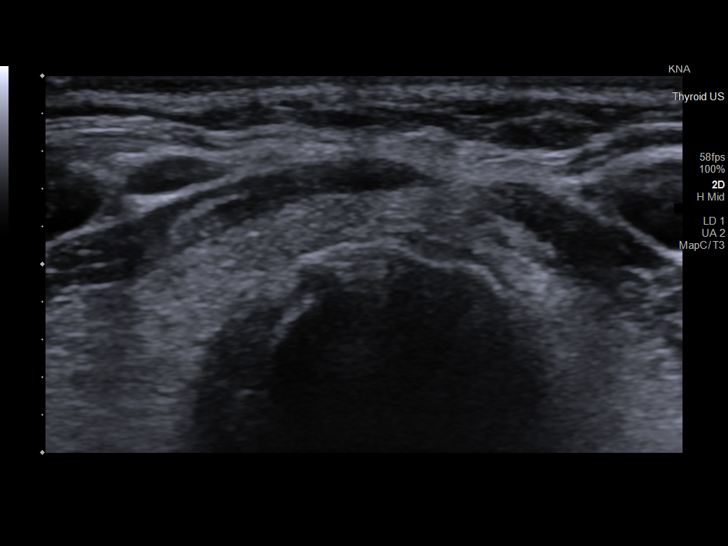
[im 10/119]
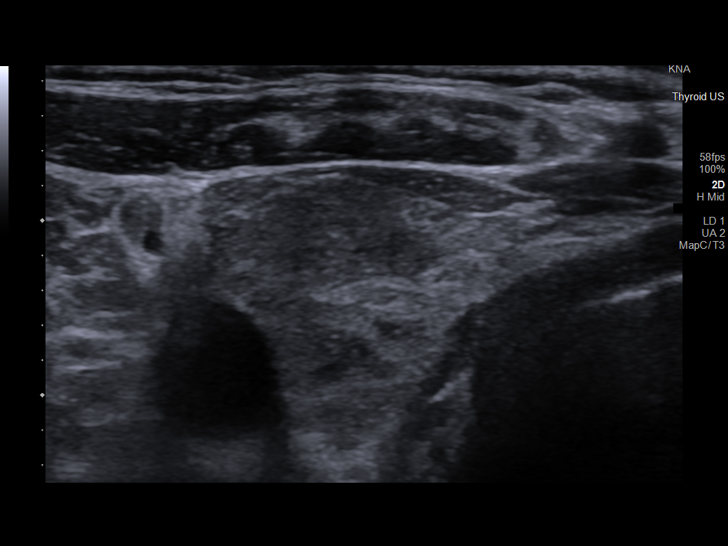
[im 20/119]
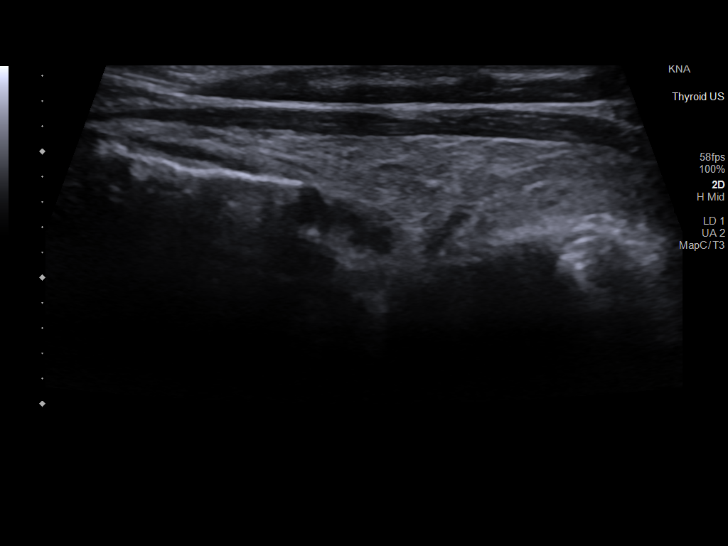
[im 30/119]
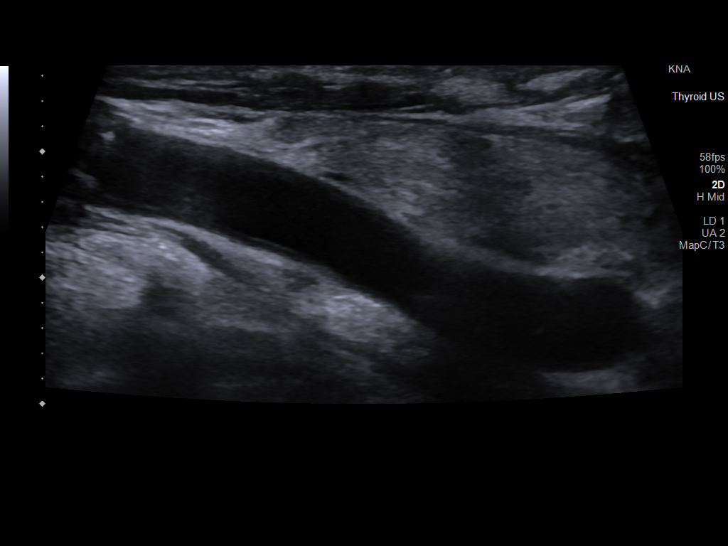
[im 40/119]
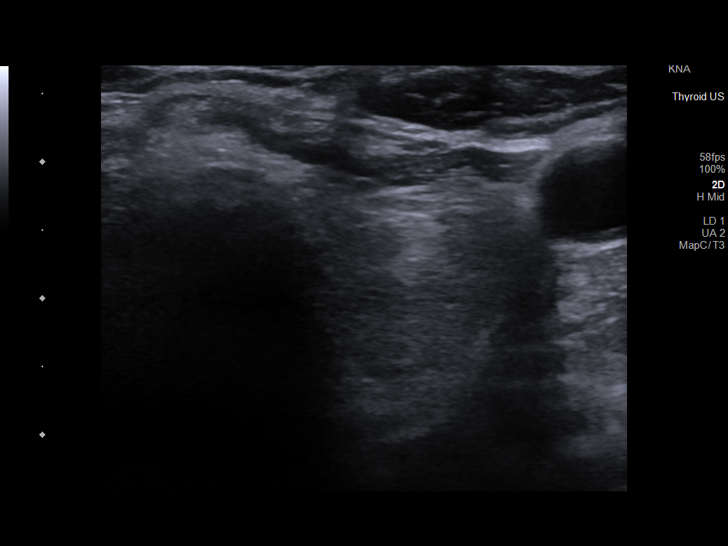
[im 45/119]
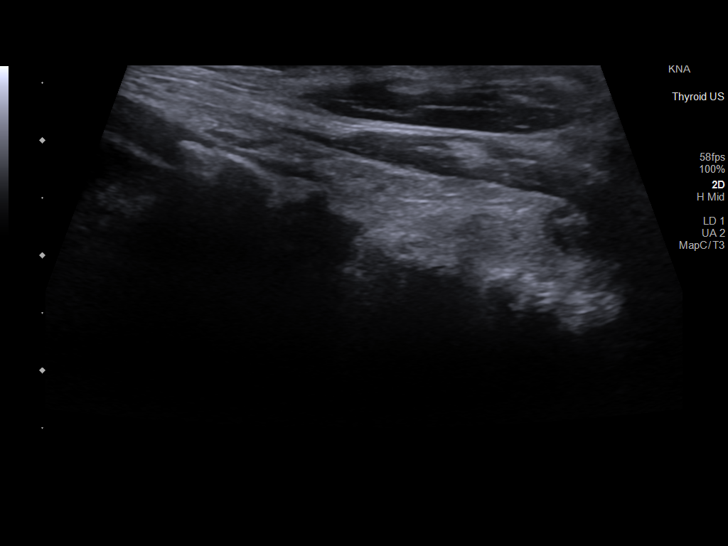
[im 55/119]
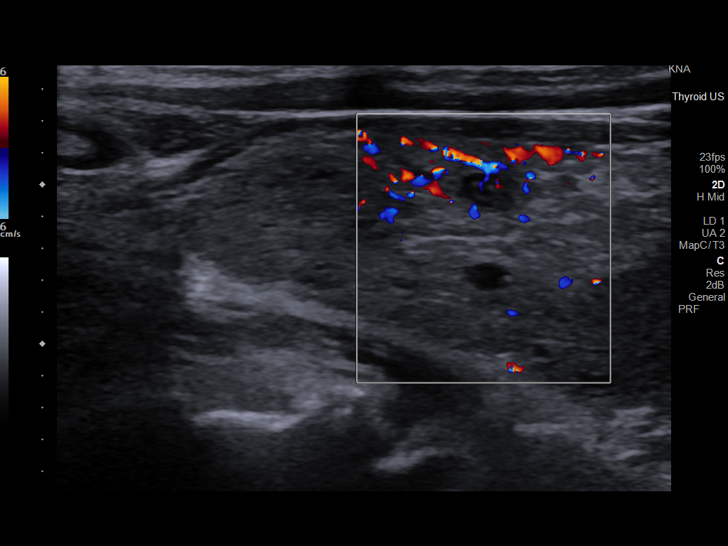
[im 64/119]
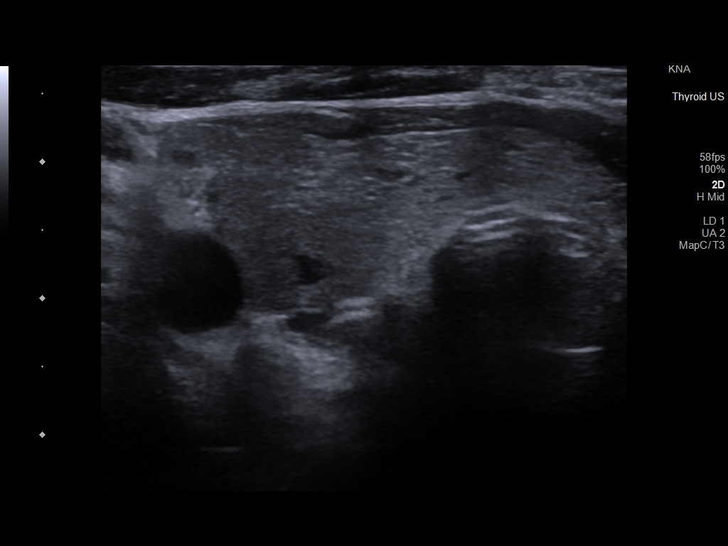
[im 74/119]
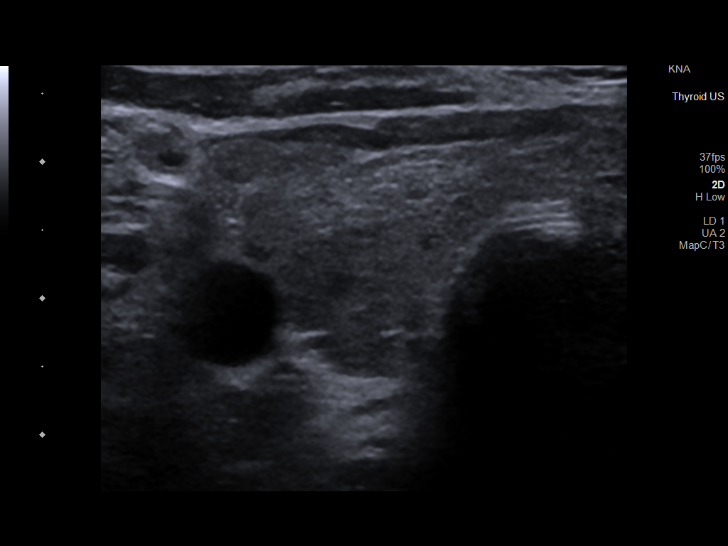
[im 79/119]
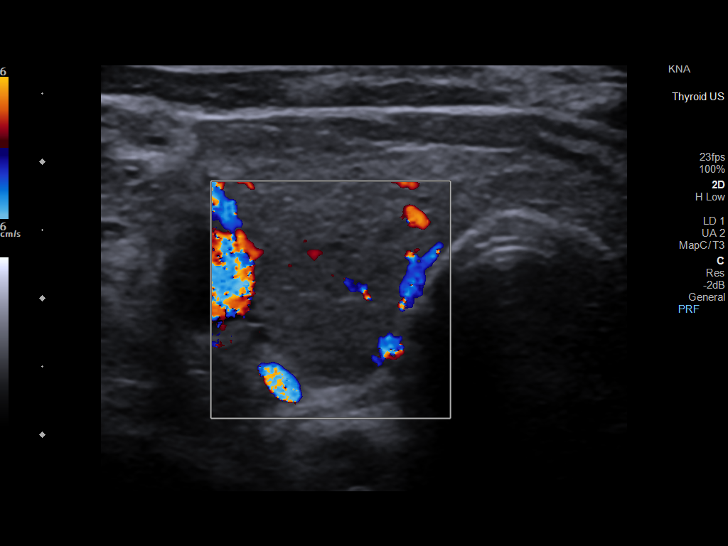
[im 89/119]
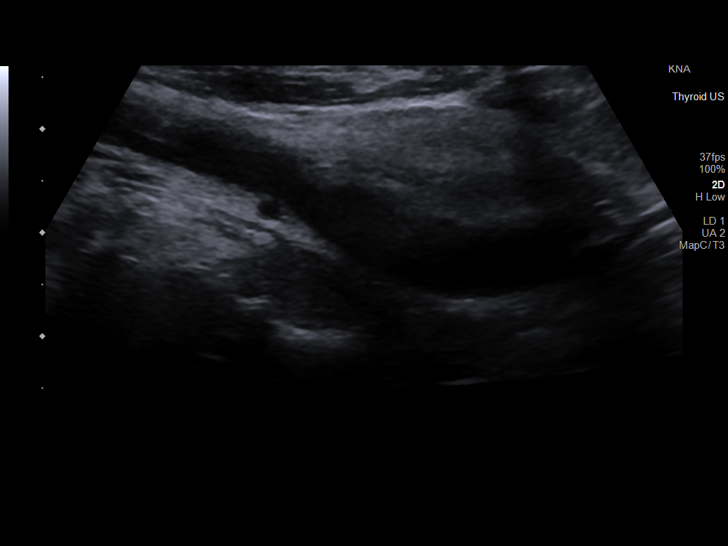
[im 99/119]
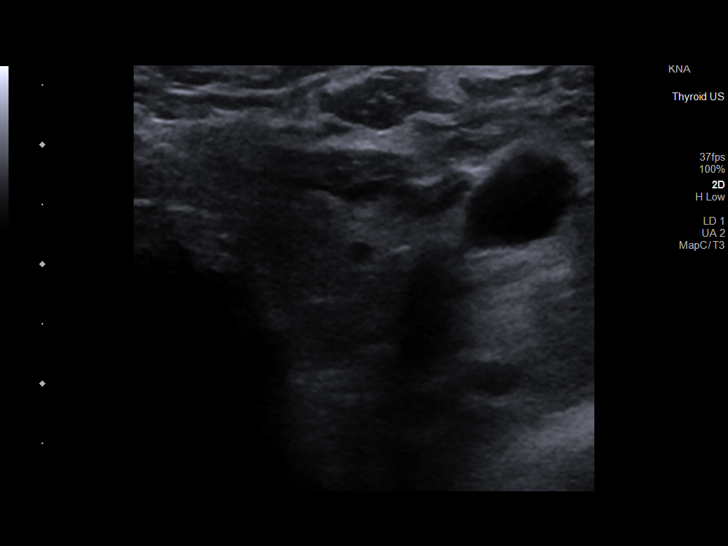
[im 109/119]
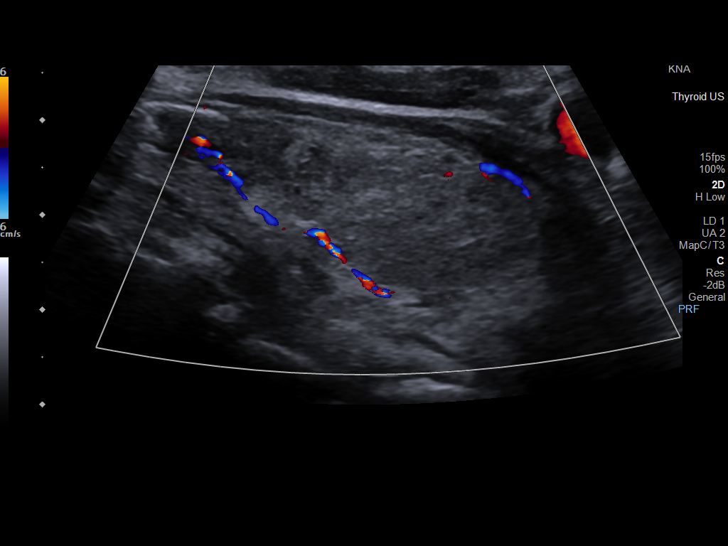
[im 119/119]
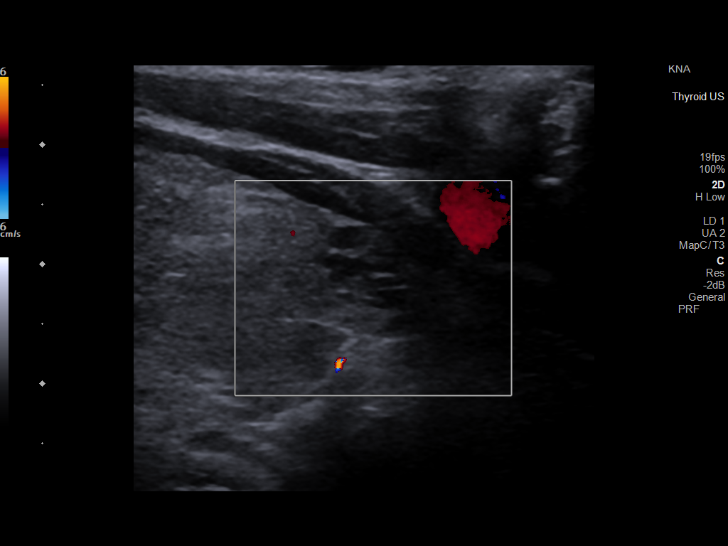

[14 of 25 positions shown; findings below may reference images not displayed]

FINDINGS: Parenchymal Echotexture: Mildly heterogeneous

Isthmus: 0.3 cm

Right lobe: 4.5 x 2.0 x 1.6 cm

Left lobe: 3.9 x 1.8 x 1.7 cm

_________________________________________________________

Estimated total number of nodules >/= 1 cm: 1

Number of spongiform nodules >/=  2 cm not described below (TR1): 0

Number of mixed cystic and solid nodules >/= 1.5 cm not described
below (TR2): 0

_________________________________________________________

1.2 x 1.0 x 0.5 cm spongiform nodule in the inferior left thyroid
lobe does not meet criteria for FNA or imaging surveillance.
IMPRESSION: No suspicious thyroid nodules.

The above is in keeping with the ACR TI-RADS recommendations - [HOSPITAL] [YE];[DATE].

## 2021-01-22 ENCOUNTER — Other Ambulatory Visit: Payer: Self-pay

## 2021-01-22 ENCOUNTER — Emergency Department (HOSPITAL_COMMUNITY)
Admission: EM | Admit: 2021-01-22 | Discharge: 2021-01-22 | Disposition: A | Discharge status: Home / Self Care | Payer: Medicare HMO | Attending: Emergency Medicine | Admitting: Emergency Medicine

## 2021-01-22 ENCOUNTER — Emergency Department (HOSPITAL_COMMUNITY): Payer: Medicare HMO

## 2021-01-22 ENCOUNTER — Encounter (HOSPITAL_COMMUNITY): Payer: Self-pay

## 2021-01-22 DIAGNOSIS — Z8543 Personal history of malignant neoplasm of ovary: Secondary | ICD-10-CM | POA: Diagnosis not present

## 2021-01-22 DIAGNOSIS — I5033 Acute on chronic diastolic (congestive) heart failure: Secondary | ICD-10-CM | POA: Insufficient documentation

## 2021-01-22 DIAGNOSIS — I2510 Atherosclerotic heart disease of native coronary artery without angina pectoris: Secondary | ICD-10-CM | POA: Diagnosis not present

## 2021-01-22 DIAGNOSIS — Z85038 Personal history of other malignant neoplasm of large intestine: Secondary | ICD-10-CM | POA: Insufficient documentation

## 2021-01-22 DIAGNOSIS — D631 Anemia in chronic kidney disease: Secondary | ICD-10-CM | POA: Diagnosis not present

## 2021-01-22 DIAGNOSIS — Z951 Presence of aortocoronary bypass graft: Secondary | ICD-10-CM | POA: Diagnosis not present

## 2021-01-22 DIAGNOSIS — R778 Other specified abnormalities of plasma proteins: Secondary | ICD-10-CM | POA: Insufficient documentation

## 2021-01-22 DIAGNOSIS — Z79899 Other long term (current) drug therapy: Secondary | ICD-10-CM | POA: Diagnosis not present

## 2021-01-22 DIAGNOSIS — R079 Chest pain, unspecified: Secondary | ICD-10-CM | POA: Diagnosis present

## 2021-01-22 DIAGNOSIS — E039 Hypothyroidism, unspecified: Secondary | ICD-10-CM | POA: Diagnosis not present

## 2021-01-22 DIAGNOSIS — N186 End stage renal disease: Secondary | ICD-10-CM | POA: Insufficient documentation

## 2021-01-22 DIAGNOSIS — I132 Hypertensive heart and chronic kidney disease with heart failure and with stage 5 chronic kidney disease, or end stage renal disease: Secondary | ICD-10-CM | POA: Insufficient documentation

## 2021-01-22 DIAGNOSIS — E1122 Type 2 diabetes mellitus with diabetic chronic kidney disease: Secondary | ICD-10-CM | POA: Insufficient documentation

## 2021-01-22 DIAGNOSIS — Z7982 Long term (current) use of aspirin: Secondary | ICD-10-CM | POA: Insufficient documentation

## 2021-01-22 DIAGNOSIS — Z992 Dependence on renal dialysis: Secondary | ICD-10-CM | POA: Insufficient documentation

## 2021-01-22 DIAGNOSIS — N189 Chronic kidney disease, unspecified: Secondary | ICD-10-CM

## 2021-01-22 DIAGNOSIS — M5489 Other dorsalgia: Secondary | ICD-10-CM

## 2021-01-22 LAB — BASIC METABOLIC PANEL
Anion gap: 9 (ref 5–15)
BUN: 22 mg/dL (ref 8–23)
CO2: 27 mmol/L (ref 22–32)
Calcium: 10.8 mg/dL — ABNORMAL HIGH (ref 8.9–10.3)
Chloride: 101 mmol/L (ref 98–111)
Creatinine, Ser: 6.73 mg/dL — ABNORMAL HIGH (ref 0.44–1.00)
GFR, Estimated: 6 mL/min — ABNORMAL LOW (ref >60)
Glucose, Bld: 91 mg/dL (ref 70–99)
Potassium: 3.9 mmol/L (ref 3.5–5.1)
Sodium: 137 mmol/L (ref 135–145)

## 2021-01-22 LAB — CBC
HCT: 32 % — ABNORMAL LOW (ref 36.0–46.0)
Hemoglobin: 9.7 g/dL — ABNORMAL LOW (ref 12.0–15.0)
MCH: 35.8 pg — ABNORMAL HIGH (ref 26.0–34.0)
MCHC: 30.3 g/dL (ref 30.0–36.0)
MCV: 118.1 fL — ABNORMAL HIGH (ref 80.0–100.0)
Platelets: 269 10*3/uL (ref 150–400)
RBC: 2.71 MIL/uL — ABNORMAL LOW (ref 3.87–5.11)
RDW: 19.1 % — ABNORMAL HIGH (ref 11.5–15.5)
WBC: 6.7 10*3/uL (ref 4.0–10.5)
nRBC: 0 % (ref 0.0–0.2)

## 2021-01-22 LAB — TROPONIN I (HIGH SENSITIVITY)
Troponin I (High Sensitivity): 94 ng/L — ABNORMAL HIGH (ref <18)
Troponin I (High Sensitivity): 84 ng/L — ABNORMAL HIGH (ref <18)

## 2021-01-22 IMAGING — DX DG CHEST 2V
2 series · 2 of 2 positions shown · non-contrast
Comparison: Chest radiograph dated [DATE].

CLINICAL DATA: 79-year-old female with chest pain.

EXAM:
CHEST - 2 VIEW

[chest lat]
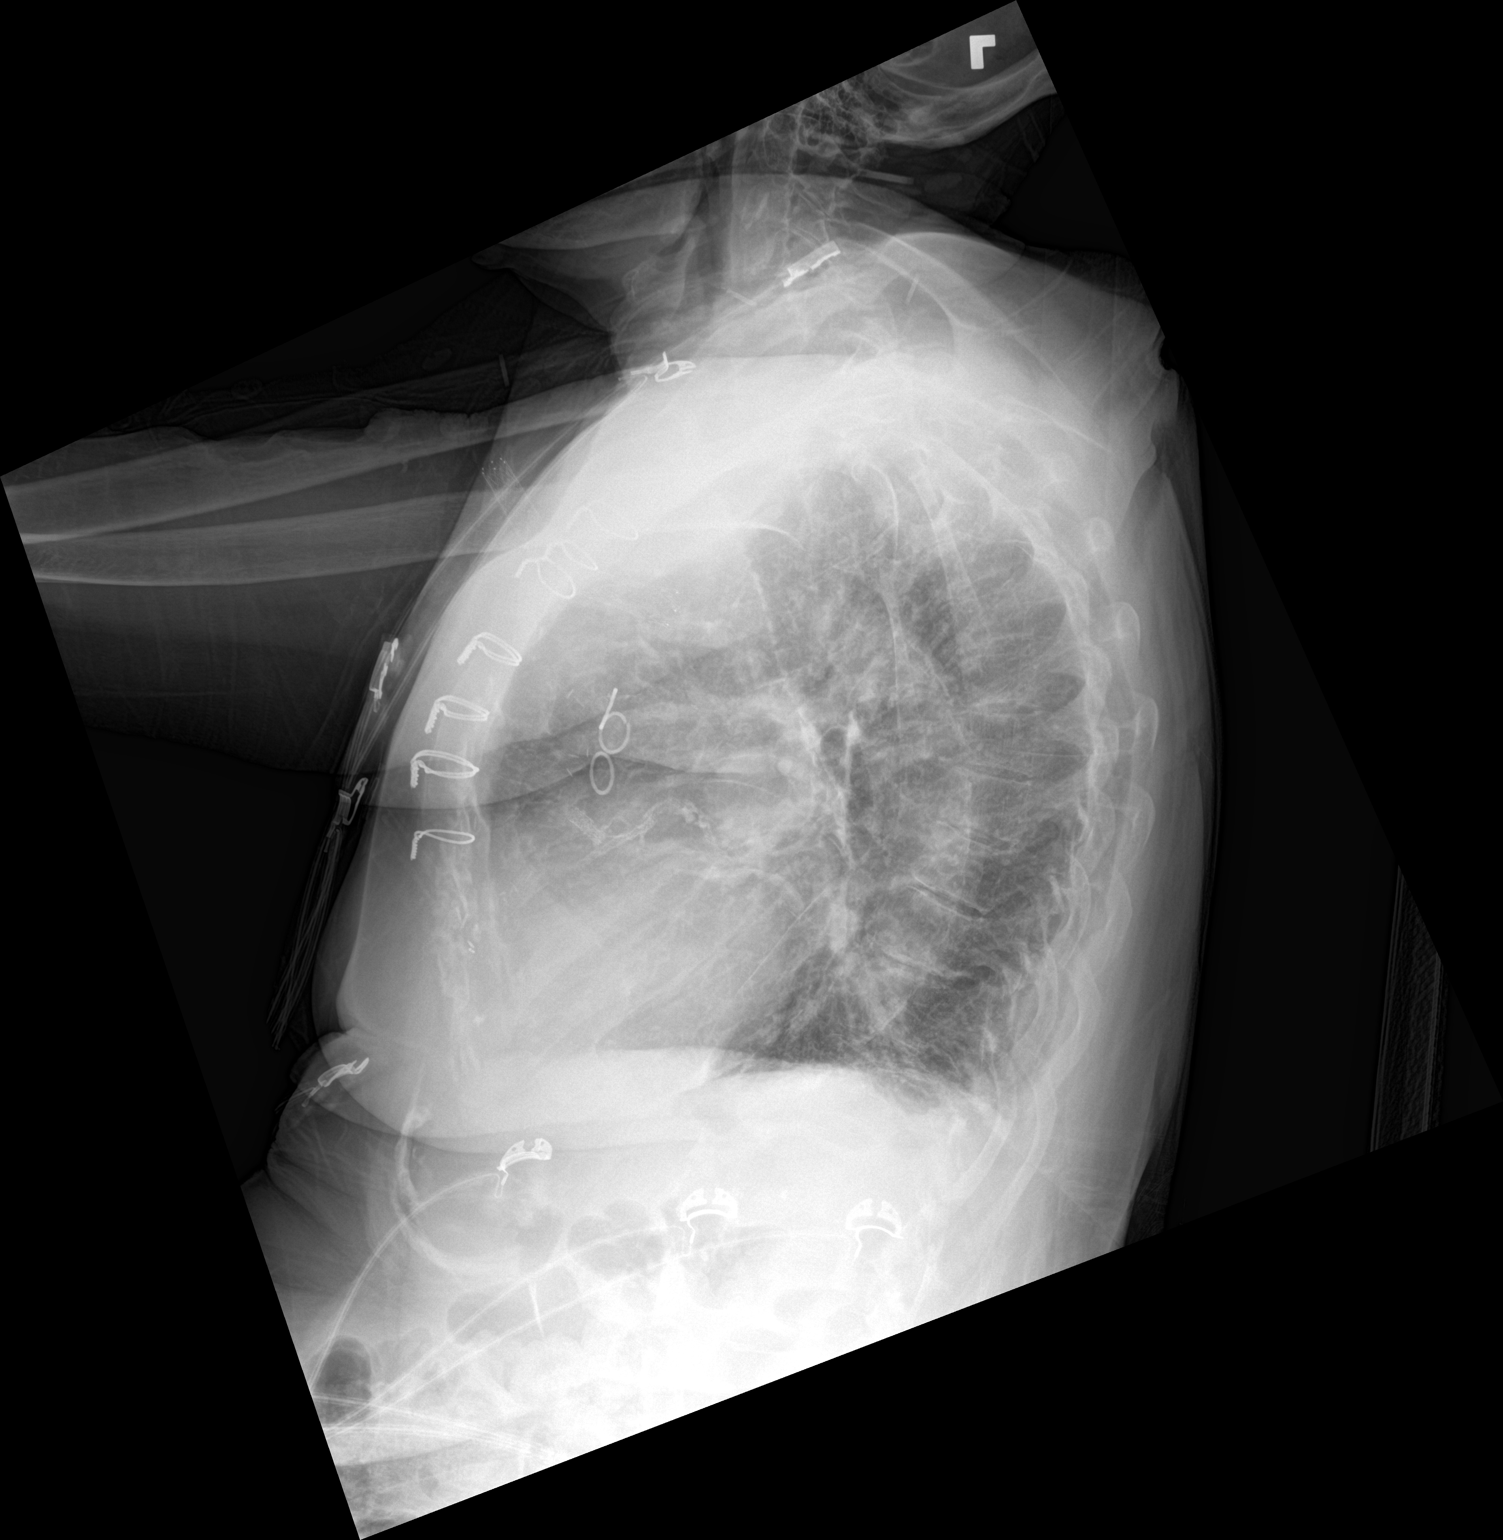

[chest ap]
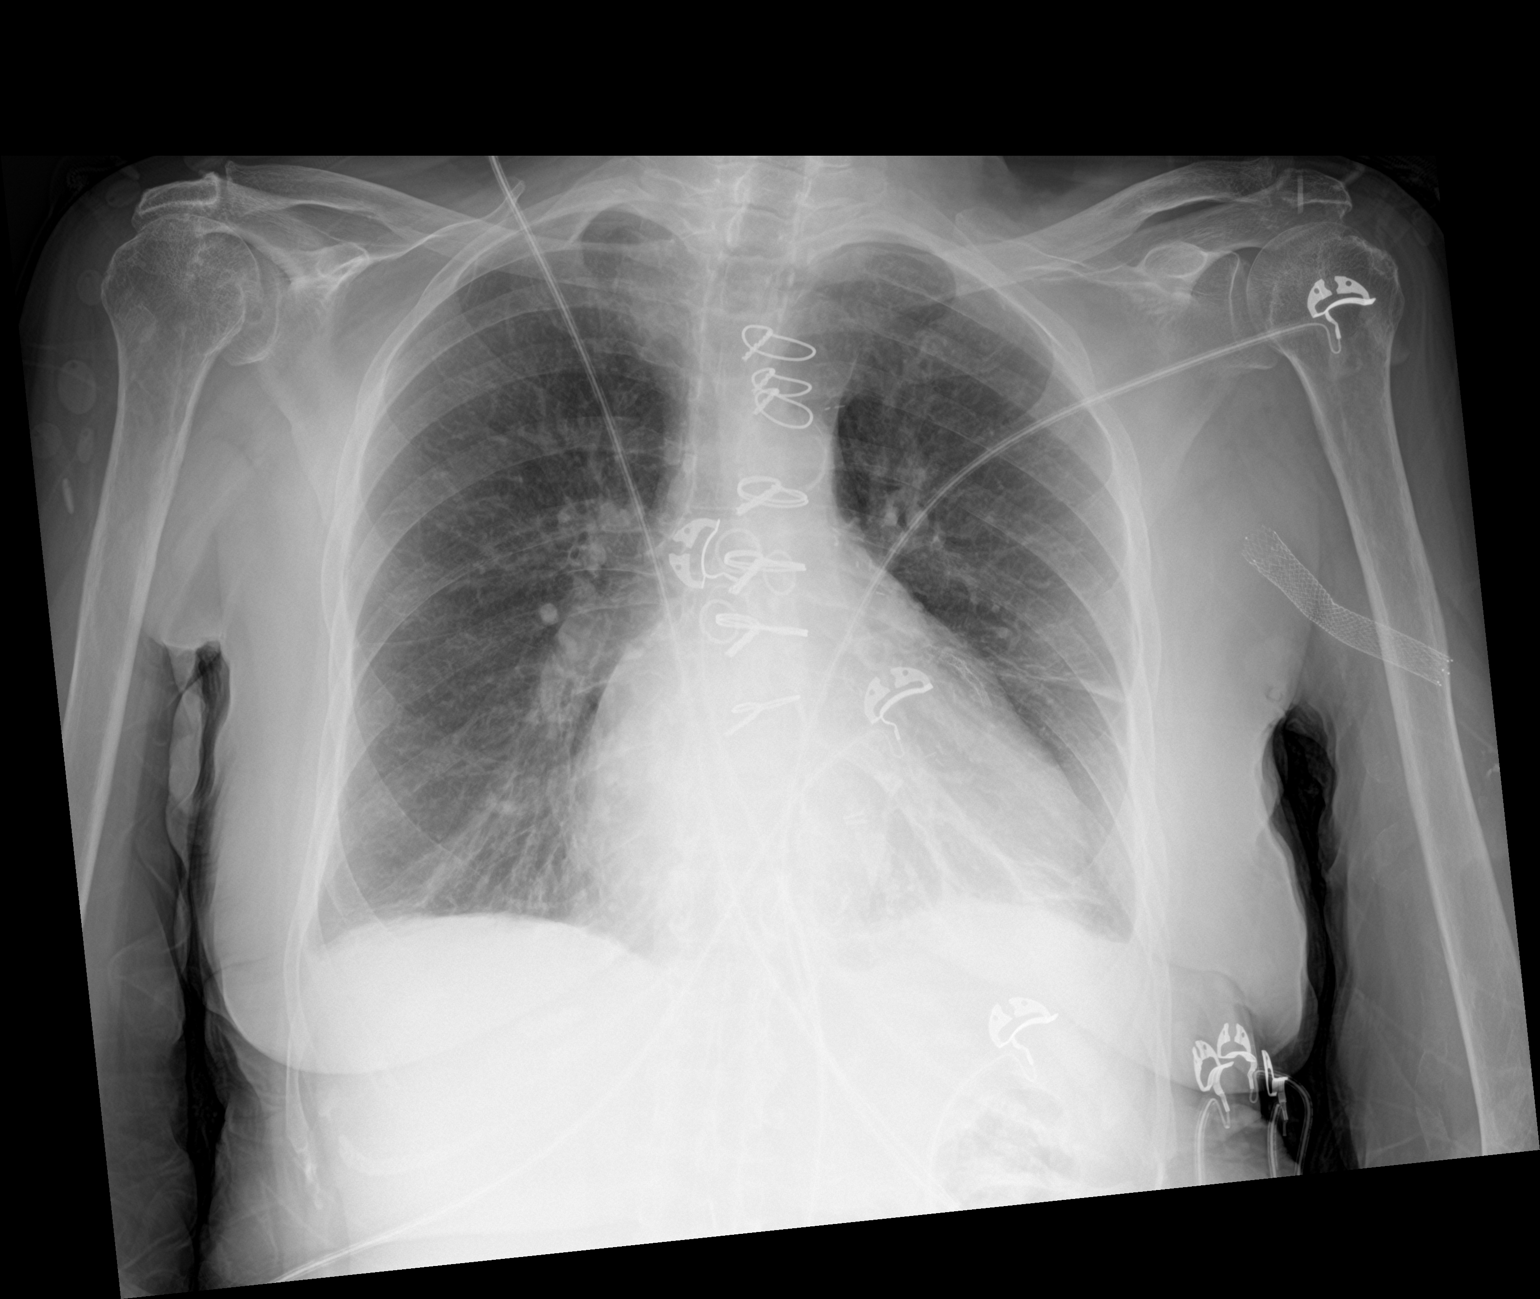

[2 of 2 positions shown; findings below may reference images not displayed]

FINDINGS: Small left pleural effusion and left lung base linear
atelectasis/scarring. Pneumonia is not excluded. Clinical
correlation is recommended. The right lung is clear. No
pneumothorax. Mild cardiomegaly. Median sternotomy wires and CABG
vascular clips. No acute osseous pathology. Osteopenia with
degenerative changes of the spine. Vascular stent noted in the
region of the left axilla.
IMPRESSION: Small left pleural effusion and left lung base atelectasis/scarring.

## 2021-01-22 MED ORDER — IBUPROFEN 400 MG PO TABS
400.0000 mg | ORAL_TABLET | Freq: Once | ORAL | Status: DC
  Filled 2021-01-22: qty 1

## 2021-01-22 NOTE — ED Notes (Signed)
ED Provider at bedside. 

## 2021-01-22 NOTE — ED Notes (Signed)
Pt requesting to leave, educated pt of risks. Dr. Roxanne Mins made aware.

## 2021-01-22 NOTE — ED Triage Notes (Signed)
Pt c/o bilateral shoulder pain that radiates to her left side of  chest that started around 2200 last night. Pt denies SOB. Pt states she took two aspirin and that helped ease the pain.

## 2021-01-22 NOTE — ED Provider Notes (Signed)
[facility] EMERGENCY DEPARTMENT Provider Note   CSN: [id number] Arrival date & time: 01/22/21  0049     History Chief Complaint  Patient presents with   Chest Pain    Shawna [name] is a 80 y.o. female.  The history is provided by the patient.  Chest Pain She has history of hypertension, diabetes, hyperlipidemia, coronary artery disease, diastolic heart failure, end-stage renal disease on hemodialysis, paroxysmal atrial fibrillation not on anticoagulation and comes in because of an episode of interscapular pain which started this evening.  Pain came on suddenly and has waxed and waned.  She currently rates pain at 8/10.  Nothing makes it better, nothing makes it worse.  There is no associated dyspnea, nausea, diaphoresis.  She denies any pain in her chest per se.  She tried taking aspirin 162 mg which did give some slight, temporary relief.  She also took a dose of nitroglycerin which did not affect the pain.  She has not had pain like this before.  Old records are reviewed, and she was admitted for chest pain with mildly elevated troponin in March of this year.  Cardiac catheterization on 07/13/2018 showed severe three-vessel coronary artery disease with patent bypass grafts.   Past Medical History:  Diagnosis Date   Acute on chronic respiratory failure with hypoxia ([place]) 10/10/2016   Anxiety    Arthritis    AVM (arteriovenous malformation) of colon    CAD (coronary artery disease)    a. s/p CABG in 2013 b. DES to D1 in 10/2016. c. cath in 07/2018 showing patent grafts with occlusion of D1 at prior stent site and progression of PDA disease --> medical management recommended   Carotid artery disease ([place])    a. 82-95% LICA, 12/2128    Chronic anemia    Chronic bronchitis (HCC)    Chronic diastolic CHF (congestive heart failure) ([place])    a. 02/2012 Echo EF 60-65%, nl wall motion, Gr 1 DD, mod MR   Colon cancer ([place]) 1992   Esophageal stricture    ESRD on hemodialysis ([place])    ESRD due  to HTN, started dialysis 2011 and gets HD at [facility] with Dr Hinda Lenis on MWF schedule.  Access is LUA AVF as of Sept 2014.    GERD (gastroesophageal reflux disease)    High cholesterol 12/2011   History of blood transfusion 07/2011; 12/2011; 01/2012 X 2; 04/2012   History of gout    History of lower GI bleeding    Hypertension    Iron deficiency anemia    Mitral regurgitation    a. Moderate by echo, 02/2012   Myocardial infarction [facility])    NSVT (nonsustained ventricular tachycardia) ([place])    Ovarian cancer ([place]) 1992   PAF (paroxysmal atrial fibrillation) ([place])    Pneumonia ~ 2009   PUD (peptic ulcer disease)    TIA (transient ischemic attack)     Patient Active Problem List   Diagnosis Date Noted   Myositis 12/03/2020   Ataxia 12/02/2020   Diabetes mellitus type 2 in nonobese ([place]) 11/10/2020   Acute pulmonary edema ([place]) 11/10/2020   Generalized weakness 11/09/2020   Dialysis AV fistula malfunction, initial encounter ([place])    Jugular vein occlusion, right (HCC)    Failure of surgically constructed arteriovenous fistula ([place]) 10/03/2020   Myoclonus 08/31/2020   Clotted renal dialysis AV graft, initial encounter ([place])    Hypertensive heart and chronic kidney disease with heart failure and stage 1 through stage 4 chronic kidney disease,  or chronic kidney disease ([place])    Hemodialysis-associated hypotension    Acute hypoxemic respiratory failure ([place]) 06/14/2020   Hypertension 06/12/2020   Irritable bowel syndrome 02/25/2020   Adenomatous duodenal polyp 09/10/2019   History of GI bleed 09/10/2019   Angina pectoris ([place]) 06/05/2019   Chest pain 06/03/2019   Small intestinal bacterial overgrowth 05/14/2019   Iron deficiency anemia 04/02/2019   GI bleed 03/08/2019   Gastrointestinal hemorrhage with melena 03/06/2019   Acute respiratory failure with hypoxia (HCC) 12/25/2018   Elevated troponin 12/14/2018   Chest pain at rest 07/13/2018   Hand steal syndrome ([place]) 08/01/2017    Anemia 07/14/2017   Coronary artery disease 06/05/2017   Mesenteric ischemia (HCC)    Diverticulitis    Enteritis    Complication of vascular access for dialysis 03/19/2017   Preoperative clearance 01/25/2017   H/O non-ST elevation myocardial infarction (NSTEMI) 10/24/2016   Fluid overload 50/27/7412   Complication from renal dialysis device 10/10/2016   Non-ST elevation MI (NSTEMI) ([place])    Encounter for fitting and adjustment of vascular catheter    Heme positive stool    Demand ischemia ([place]) 07/27/2016   Hypertensive emergency 07/08/2016   Acute on chronic respiratory failure with hypoxia [facility])    Cardiac arrest [facility])    Palliative care encounter    Goals of care, counseling/discussion    Hypertensive crisis without congestive heart failure 05/09/2016   Flash pulmonary edema ([place]) 04/06/2016   Acute respiratory failure ([place]) 04/06/2016   Hypertensive crisis 01/27/2016   History of colon cancer 01/27/2016   History of ovarian cancer 01/27/2016   Hypertensive urgency 01/27/2016   Paroxysmal atrial fibrillation ([place]) 10/14/2015   Coronary angioplasty status 10/14/2015   Malignant neoplasm of right ovary ([place]) 10/14/2015   Narrow complex tachycardia ([place]) 09/08/2015   SVT (supraventricular tachycardia) ([place]) 09/08/2015   Influenza A 08/30/2015   Acute on chronic diastolic CHF (congestive heart failure) ([place]) 05/04/2015   Unstable angina ([place]) 05/03/2015   DOE (dyspnea on exertion)    Essential hypertension    Pain in joint, lower leg 08/14/2014   Dacryocystitis 05/29/2013   Chronic diastolic CHF (congestive heart failure) ([place]) 03/22/2013   GI bleeding 03/21/2013   Acute blood loss anemia 03/21/2013   Occlusion and stenosis of carotid artery without mention of cerebral infarction 01/24/2013   Hx of CABG 07/05/2012   Carotid artery disease ([place]) 07/05/2012   Mitral regurgitation 06/12/2012   Pneumonia 06/09/2012   Non-STEMI (non-ST elevated myocardial infarction) ([place])  06/08/2012   Ischemic chest pain ([place]) 03/01/2012   AVM (arteriovenous malformation) of small bowel, acquired 01/20/2012   GERD (gastroesophageal reflux disease) 01/09/2012   HLD (hyperlipidemia) 01/05/2012   Atherosclerotic heart disease of native coronary artery without angina pectoris 12/16/2011   Essential hypertension, benign 12/16/2011   ESRD on hemodialysis ([place]) 12/16/2011   Anxiety disorder 05/04/2011   Anemia in chronic kidney disease 04/29/2011   ESRD (end stage renal disease) on dialysis ([place]) 04/29/2011   Gout 04/29/2011   Hypertensive chronic kidney disease with stage 5 chronic kidney disease or end stage renal disease ([place]) 04/29/2011    Past Surgical History:  Procedure Laterality Date   A/V SHUNTOGRAM Left 03/19/2019   Procedure: A/V SHUNTOGRAM;  Surgeon: Katha Cabal, MD;  Location: [place] CV LAB;  Service: Cardiovascular;  Laterality: Left;   ABDOMINAL HYSTERECTOMY  1992   APPENDECTOMY  06/1990   AV FISTULA PLACEMENT  07/2009   left upper arm   AV FISTULA PLACEMENT  Right 09/06/2016   Procedure: RIGHT FOREARM ARTERIOVENOUS (AV) GRAFT;  Surgeon: Elam Dutch, MD;  Location: [facility] OR;  Service: Vascular;  Laterality: Right;   AV FISTULA PLACEMENT N/A 02/24/2017   Procedure: INSERTION OF ARTERIOVENOUS (AV) GORE-TEX GRAFT ARM (BRACHIAL AXILLARY);  Surgeon: Katha Cabal, MD;  Location: [facility] ORS;  Service: Vascular;  Laterality: N/A;   [place] Right 09/06/2016   Procedure: REMOVAL OF Right Arm ARTERIOVENOUS GORETEX GRAFT and Vein Patch angioplasty of brachial artery;  Surgeon: Angelia Mould, MD;  Location: [place];  Service: Vascular;  Laterality: Right;   BIOPSY  09/26/2019   Procedure: BIOPSY;  Surgeon: Rogene Houston, MD;  [facility];  Service: Endoscopy;;   COLON RESECTION  1992   COLON SURGERY     COLONOSCOPY N/A 03/09/2019   Procedure: COLONOSCOPY;  Surgeon: Rogene Houston, MD;  [facility];  Service: Endoscopy;   Laterality: N/A;   CORONARY ANGIOPLASTY WITH STENT PLACEMENT  12/15/11   "2"   CORONARY ANGIOPLASTY WITH STENT PLACEMENT  y/2013   "1; makes total of 3" (05/02/2012)   CORONARY ARTERY BYPASS GRAFT  06/13/2012   Procedure: CORONARY ARTERY BYPASS GRAFTING (CABG);  Surgeon: [place] Isaac, MD;  Location: [place];  Service: Open Heart Surgery;  Laterality: N/A;  cabg x four;  using left internal mammary artery, and left leg greater saphenous vein harvested endoscopically   CORONARY STENT INTERVENTION N/A 10/13/2016   Procedure: Coronary Stent Intervention;  Surgeon: Troy Sine, MD;  Location: [place] CV LAB;  Service: Cardiovascular;  Laterality: N/A;   DIALYSIS/PERMA CATHETER REMOVAL N/A 04/18/2017   Procedure: DIALYSIS/PERMA CATHETER REMOVAL;  Surgeon: Katha Cabal, MD;  Location: [place] CV LAB;  Service: Cardiovascular;  Laterality: N/A;   DILATION AND CURETTAGE OF UTERUS     ESOPHAGOGASTRODUODENOSCOPY  01/20/2012   Procedure: ESOPHAGOGASTRODUODENOSCOPY (EGD);  Surgeon: Ladene Artist, MD,FACG;  Location: [facility] ENDOSCOPY;  Service: Endoscopy;  Laterality: N/A;   ESOPHAGOGASTRODUODENOSCOPY N/A 03/26/2013   Procedure: ESOPHAGOGASTRODUODENOSCOPY (EGD);  Surgeon: Irene Shipper, MD;  Location: [facility] ENDOSCOPY;  Service: Endoscopy;  Laterality: N/A;   ESOPHAGOGASTRODUODENOSCOPY N/A 04/30/2015   Procedure: ESOPHAGOGASTRODUODENOSCOPY (EGD);  Surgeon: Rogene Houston, MD;  [facility];  Service: Endoscopy;  Laterality: N/A;  1pm - moved to 10/20 @ 1:10   ESOPHAGOGASTRODUODENOSCOPY N/A 07/29/2016   Procedure: ESOPHAGOGASTRODUODENOSCOPY (EGD);  Surgeon: Manus Gunning, MD;  Location: [place];  Service: Gastroenterology;  Laterality: N/A;  enteroscopy   ESOPHAGOGASTRODUODENOSCOPY N/A 09/26/2019   Procedure: ESOPHAGOGASTRODUODENOSCOPY (EGD);  Surgeon: Rogene Houston, MD;  [facility];  Service: Endoscopy;  Laterality: N/A;  [place]  03/07/2019   Procedure: GIVENS CAPSULE STUDY;  Surgeon: Rogene Houston, MD;  [facility];  Service: Endoscopy;  Laterality: N/A;  7:30   INSERTION OF DIALYSIS CATHETER N/A 10/05/2020   Procedure: ABORTED TUNNELED DIALYSIS CATHETER PLACEMENT RIGHT INTERNAL JUGULAR VEIN ;  Surgeon: Virl Cagey, MD;  Location: [facility] OR[facility];  Service: General;  Laterality: N/A;   INTRAOPERATIVE TRANSESOPHAGEAL ECHOCARDIOGRAM  06/13/2012   Procedure: INTRAOPERATIVE TRANSESOPHAGEAL ECHOCARDIOGRAM;  Surgeon: [place] Isaac, MD;  Location: [place];  Service: Open Heart Surgery;  Laterality: N/A;   IR DIALY SHUNT INTRO NEEDLE/INTRACATH INITIAL W/IMG LEFT Left 10/06/2020   IR FLUORO GUIDE CV LINE RIGHT  06/17/2020   IR GENERIC HISTORICAL  07/26/2016   IR FLUORO GUIDE CV LINE RIGHT 07/26/2016 Greggory Keen, MD  [facility]   IR GENERIC HISTORICAL  07/26/2016   IR US GUIDE VASC ACCESS RIGHT 07/26/2016 Greggory Keen, MD [facility]   IR GENERIC HISTORICAL  08/02/2016   IR US GUIDE VASC ACCESS RIGHT 08/02/2016 Greggory Keen, MD [facility]   IR GENERIC HISTORICAL  08/02/2016   IR FLUORO GUIDE CV LINE RIGHT 08/02/2016 Greggory Keen, MD [facility]   IR RADIOLOGY PERIPHERAL GUIDED IV START  03/28/2017   IR REMOVAL TUN CV CATH W/O FL  08/11/2020   IR THROMBECTOMY AV FISTULA W/THROMBOLYSIS INC/SHUNT/IMG LEFT Left 06/17/2020   IR US GUIDE VASC ACCESS LEFT  06/17/2020   IR US GUIDE VASC ACCESS RIGHT  03/28/2017   IR US GUIDE VASC ACCESS RIGHT  06/17/2020   LEFT HEART CATH AND CORONARY ANGIOGRAPHY N/A 09/20/2016   Procedure: Left Heart Cath and Coronary Angiography;  Surgeon: [name], MD;  Location: [place] CV LAB;  Service: Cardiovascular;  Laterality: N/A;   LEFT HEART CATH AND CORS/GRAFTS ANGIOGRAPHY N/A 10/13/2016   Procedure: Left Heart Cath and Cors/Grafts Angiography;  Surgeon: Troy Sine, MD;  Location: [employer] CV LAB;  Service: Cardiovascular;  Laterality: N/A;   LEFT HEART CATH AND CORS/GRAFTS  ANGIOGRAPHY N/A 07/13/2018   Procedure: LEFT HEART CATH AND CORS/GRAFTS ANGIOGRAPHY;  Surgeon: Martinique, Peter M, MD;  Location: [place] CV LAB;  Service: Cardiovascular;  Laterality: N/A;   LEFT HEART CATHETERIZATION WITH CORONARY ANGIOGRAM N/A 12/15/2011   Procedure: LEFT HEART CATHETERIZATION WITH CORONARY ANGIOGRAM;  Surgeon: Burnell Blanks, MD;  Location: [facility] CATH LAB;  Service: Cardiovascular;  Laterality: N/A;   LEFT HEART CATHETERIZATION WITH CORONARY ANGIOGRAM N/A 01/10/2012   Procedure: LEFT HEART CATHETERIZATION WITH CORONARY ANGIOGRAM;  Surgeon: Peter M Martinique, MD;  Location: [facility] CATH LAB;  Service: Cardiovascular;  Laterality: N/A;   LEFT HEART CATHETERIZATION WITH CORONARY ANGIOGRAM N/A 06/08/2012   Procedure: LEFT HEART CATHETERIZATION WITH CORONARY ANGIOGRAM;  Surgeon: Burnell Blanks, MD;  Location: [facility] CATH LAB;  Service: Cardiovascular;  Laterality: N/A;   LEFT HEART CATHETERIZATION WITH CORONARY/GRAFT ANGIOGRAM N/A 12/10/2013   Procedure: LEFT HEART CATHETERIZATION WITH Beatrix Fetters;  Surgeon: [name], MD;  Location: [facility] CATH LAB;  Service: Cardiovascular;  Laterality: N/A;   OVARY SURGERY     ovarian cancer   POLYPECTOMY  03/09/2019   Procedure: POLYPECTOMY;  Surgeon: Rogene Houston, MD;  [facility];  Service: Endoscopy;;  cecal    POLYPECTOMY N/A 09/26/2019   Procedure: DUODENAL POLYPECTOMY;  Surgeon: Rogene Houston, MD;  [facility];  Service: Endoscopy;  Laterality: N/A;   REVISION OF ARTERIOVENOUS GORETEX GRAFT N/A 02/24/2017   Procedure: REVISION OF ARTERIOVENOUS GORETEX GRAFT (RESECTION);  Surgeon: Katha Cabal, MD;  Location: [facility] ORS;  Service: Vascular;  Laterality: N/A;   REVISON OF ARTERIOVENOUS FISTULA Left 06/19/2020   Procedure: REVISION OF LEFT UPPER ARM AV GRAFT WITH INTERPOSITION JUMP GRAFT USING 6MM GORE LIMB;  Surgeon: Marty Heck, MD;  Location: [facility];  Service: Vascular;  Laterality:  Left;   SHUNTOGRAM N/A 10/15/2013   Procedure: Fistulogram;  Surgeon: Serafina Mitchell, MD;  Location: [facility] CATH LAB;  Service: Cardiovascular;  Laterality: N/A;   THROMBECTOMY / ARTERIOVENOUS GRAFT REVISION  2011   left upper arm   TUBAL LIGATION  1980's   UPPER EXTREMITY ANGIOGRAPHY Bilateral 12/06/2016   Procedure: Upper Extremity Angiography;  Surgeon: Katha Cabal, MD;  Location: [place] CV LAB;  Service: Cardiovascular;  Laterality: Bilateral;  UPPER EXTREMITY INTERVENTION Left 06/06/2017   Procedure: UPPER EXTREMITY INTERVENTION;  Surgeon: Katha Cabal, MD;  Location: [place] CV LAB;  Service: Cardiovascular;  Laterality: Left;     OB History     Gravida  2   Para  2   Term      Preterm  2   AB      Living  2      SAB      IAB      Ectopic      Multiple      Live Births              Family History  Problem Relation Age of Onset   Heart disease Mother        Heart Disease before age 54   Hyperlipidemia Mother    Hypertension Mother    Diabetes Mother    Heart attack Mother    Heart disease Father        Heart Disease before age 74   Hyperlipidemia Father    Hypertension Father    Diabetes Father    Diabetes Sister    Hypertension Sister    Diabetes Brother    Hyperlipidemia Brother    Heart attack Brother    Hypertension Sister    Heart attack Brother    Colon cancer Child 27   Other Other        noncontributory for early CAD   Esophageal cancer Neg Hx    Liver disease Neg Hx    Kidney disease Neg Hx    Colon polyps Neg Hx     Social History   Tobacco Use   Smoking status: Never   Smokeless tobacco: Never  Vaping Use   Vaping Use: Never used  Substance Use Topics   Alcohol use: No    Alcohol/week: 0.0 standard drinks   Drug use: No    Home Medications Prior to Admission medications   Medication Sig Start Date End Date Taking? Authorizing Provider  aspirin EC 81 MG tablet Take 81 mg by mouth daily.   09/27/19   Rehman, Mechele Dawley, MD  atorvastatin (LIPITOR) 10 MG tablet Take 10 mg by mouth daily.    [provider]  cyclobenzaprine (FLEXERIL) 5 MG tablet Take 5 mg by mouth 3 (three) times daily as needed for muscle spasms. 11/30/20   [provider]  fluticasone (FLONASE) 50 MCG/ACT nasal spray Place 1 spray into both nostrils at bedtime as needed for allergies.     [provider]  hydrOXYzine (VISTARIL) 100 MG capsule Take 100 mg by mouth 2 (two) times daily as needed for anxiety or itching. 11/23/20   [provider]  isosorbide mononitrate (IMDUR) 60 MG 24 hr tablet Take 1 tablet (60 mg total) by mouth in the morning and at bedtime. 10/20/20 01/18/21  Erma Heritage, PA-C  lidocaine-prilocaine (EMLA) cream Apply 1 application topically every Monday, Wednesday, and Friday. Prior to dialysis    [provider]  loperamide (IMODIUM A-D) 2 MG tablet Take 2 tablets (4 mg total) by mouth daily as needed for diarrhea or loose stools. 12/17/20   Thurnell Lose, MD  loratadine (CLARITIN) 10 MG tablet Take 1 tablet (10 mg total) by mouth daily as needed for allergies. 07/14/18   Hosie Poisson, MD  metoprolol tartrate (LOPRESSOR) 25 MG tablet Take 1 tablet (25 mg total) by mouth 2 (two) times daily. 12/17/20   Thurnell Lose, MD  midodrine ([place])  5 MG tablet Take Monday, Wednesday, Friday morning BEFORE dialysis 12/22/20   Arnoldo Lenis, MD  multivitamin (RENA-VIT) TABS tablet Take 1 tablet by mouth daily.    [provider]  nitroGLYCERIN (NITROSTAT) 0.4 MG SL tablet Place 1 tablet (0.4 mg total) under the tongue every 5 (five) minutes x 3 doses as needed for chest pain (if no relief after 2nd dose, proceed to the ED for an evaluation or call 911). 09/08/20   Verta Ellen., NP  omeprazole (PRILOSEC) 20 MG capsule Take 1 capsule (20 mg total) by mouth daily. 12/01/20   Rogene Houston, MD  sevelamer carbonate (RENVELA) 800 MG tablet Take 2  tablets (1,600 mg total) by mouth 3 (three) times daily with meals. 06/20/20   Meccariello, Bernita Raisin, DO  triamcinolone (KENALOG) 0.025 % ointment Apply 1 application topically 2 (two) times daily as needed (itching). 11/20/20   [provider]  triamcinolone cream (KENALOG) 0.1 % Apply 1 application topically 2 (two) times daily as needed (itching). 11/19/20   [provider]    Allergies    Aspirin, Penicillins, Amlodipine, Bactrim [sulfamethoxazole-trimethoprim], Contrast media [iodinated diagnostic agents], Iron, Nitrofurantoin, Tylenol [acetaminophen], Gabapentin, Hydralazine, Levofloxacin, No known allergies, Ranexa [ranolazine], Dexilant [dexlansoprazole], Levaquin [levofloxacin in d5w], Morphine and related, Plavix [clopidogrel bisulfate], Protonix [pantoprazole sodium], and Venofer [ferric oxide]  Review of Systems   Review of Systems  Cardiovascular:  Positive for chest pain.  All other systems reviewed and are negative.  Physical Exam Updated Vital Signs BP (!) 182/57   Pulse 73   Temp 97.8 F (36.6 C) (Oral)   Resp 14   Ht 5\' 5"  (1.651 m)   Wt 57 kg   SpO2 97%   BMI 20.91 kg/m   Physical Exam Vitals and nursing note reviewed.  80 year old female, resting comfortably and in no acute distress. Vital signs are significant for elevated blood pressure. Oxygen saturation is 97%, which is normal. Head is normocephalic and atraumatic. PERRLA, EOMI. Oropharynx is clear. Neck is nontender and supple without adenopathy or JVD. Back is nontender and there is no CVA tenderness. Lungs are clear without rales, wheezes, or rhonchi. Chest is nontender. Heart has regular rate and rhythm without murmur. Abdomen is soft, flat, nontender without masses or hepatosplenomegaly and peristalsis is normoactive. Extremities have no cyanosis or edema, full range of motion is present.  AV fistula is present in the left upper extremity with thrill present. Skin is warm and dry  without rash. Neurologic: Mental status is normal, cranial nerves are intact, there are no motor or sensory deficits.  ED Results / Procedures / Treatments   Labs (all labs ordered are listed, but only abnormal results are displayed) Labs Reviewed  BASIC METABOLIC PANEL - Abnormal; Notable for the following components:      Result Value   Creatinine, Ser 6.73 (*)    Calcium 10.8 (*)    GFR, Estimated 6 (*)    All other components within normal limits  CBC - Abnormal; Notable for the following components:   RBC 2.71 (*)    Hemoglobin 9.7 (*)    HCT 32.0 (*)    MCV 118.1 (*)    MCH 35.8 (*)    RDW 19.1 (*)    All other components within normal limits  TROPONIN I (HIGH SENSITIVITY) - Abnormal; Notable for the following components:   Troponin I (High Sensitivity) 94 (*)    All other components within normal limits  TROPONIN I (  HIGH SENSITIVITY) - Abnormal; Notable for the following components:   Troponin I (High Sensitivity) 84 (*)    All other components within normal limits    EKG EKG Interpretation  Date/Time:  Friday January 22 2021 01:02:11 EDT Ventricular Rate:  72 PR Interval:  177 QRS Duration: 144 QT Interval:  468 QTC Calculation: 513 R Axis:   -7 Text Interpretation: Sinus rhythm Probable left atrial enlargement Left bundle branch block When compared with ECG of 12/18/2020, No significant change was found Confirmed by Delora Fuel ([id number]) on 01/22/2021 1:13:13 AM  Radiology DG Chest 2 View  Result Date: 01/22/2021 CLINICAL DATA:  80 year old female with chest pain. EXAM: CHEST - 2 VIEW COMPARISON:  Chest radiograph dated 12/18/2020. FINDINGS: Small left pleural effusion and left lung base linear atelectasis/scarring. Pneumonia is not excluded. Clinical correlation is recommended. The right lung is clear. No pneumothorax. Mild cardiomegaly. Median sternotomy wires and CABG vascular clips. No acute osseous pathology. Osteopenia with degenerative changes of the spine.  Vascular stent noted in the region of the left axilla. IMPRESSION: Small left pleural effusion and left lung base atelectasis/scarring. Electronically Signed   By: Anner Crete M.D.   On: 01/22/2021 02:47   US THYROID  Result Date: 01/21/2021 CLINICAL DATA:  Hypothyroidism EXAM: THYROID ULTRASOUND TECHNIQUE: Ultrasound examination of the thyroid gland and adjacent soft tissues was performed. COMPARISON:  None. FINDINGS: Parenchymal Echotexture: Mildly heterogeneous Isthmus: 0.3 cm Right lobe: 4.5 x 2.0 x 1.6 cm Left lobe: 3.9 x 1.8 x 1.7 cm _________________________________________________________ Estimated total number of nodules >/= 1 cm: 1 Number of spongiform nodules >/=  2 cm not described below (TR1): 0 Number of mixed cystic and solid nodules >/= 1.5 cm not described below (TR2): 0 _________________________________________________________ 1.2 x 1.0 x 0.5 cm spongiform nodule in the inferior left thyroid lobe does not meet criteria for FNA or imaging surveillance. IMPRESSION: No suspicious thyroid nodules. The above is in keeping with the ACR TI-RADS recommendations - J Am Coll Radiol 2017;14:587-595. Electronically Signed   By: Miachel Roux M.D.   On: 01/21/2021 13:44    Procedures Procedures   Medications Ordered in ED Medications  ibuprofen (ADVIL) tablet 400 mg (has no administration in time range)    ED Course  I have reviewed the triage vital signs and the nursing notes.  Pertinent labs & imaging results that were available during my care of the patient were reviewed by me and considered in my medical decision making (see chart for details).   MDM Rules/Calculators/A&P                         Interscapular pain of uncertain cause.  ECG shows left bundle branch block and is unchanged from prior.  She will need to have serial troponins to rule out angina equivalent.  Old records are reviewed showing she has a history of severe three-vessel coronary artery disease status post bypass  surgery with patent stents.  Chest x-ray shows a small left pleural effusion.  Labs show end-stage renal disease, macrocytic anemia which is stable compared with baseline.  Troponin is mildly elevated, but at a similar range that it has been before.  She is kept in the emergency department for a repeat troponin which actually has decreased.  At this point, no sign of active cardiac disease.  Cause of her pain is unclear but it does appear to be benign.  She is discharged with instructions to follow-up with PCP, return  precautions discussed.  Final Clinical Impression(s) / ED Diagnoses Final diagnoses:  Interscapular pain  Elevated troponin  End-stage renal disease on hemodialysis ([place])  Anemia associated with chronic renal failure    Rx / DC Orders ED Discharge Orders     None        Delora Fuel, MD 46/50[birth year]35 [phone]

## 2021-01-22 NOTE — ED Notes (Signed)
3 RNs attempted IV access without success. EDP made aware.

## 2021-01-22 NOTE — ED Notes (Signed)
Patient transported to X-ray 

## 2021-01-22 NOTE — Discharge Instructions (Addendum)
Your evaluation today did not show any sign of any heart or lung problem causing your pain.  However, if you have any new or concerning symptoms, please return to the emergency department.  Make sure you go for your scheduled dialysis today.

## 2021-01-26 LAB — TSH: TSH: 6.08 — AB (ref 0.41–5.90)

## 2021-01-28 ENCOUNTER — Ambulatory Visit: Payer: Medicare HMO | Admitting: Nurse Practitioner

## 2021-02-01 NOTE — Patient Instructions (Signed)
Thyroid Nodule  A thyroid nodule is an isolated growth of thyroid cells that forms a lump in your thyroid gland. The thyroid gland is a butterfly-shaped gland. It is found in the lower front of your neck. This gland sends chemical messengers (hormones) through your blood to all parts of your body. These hormones are important inregulating your body temperature and helping your body to use energy. Thyroid nodules are common. Most are not cancerous (benign). You may have one nodule or several nodules. Different types of thyroid nodules include nodules that: Grow and fill with fluid (thyroid cysts). Produce too much thyroid hormone (hot nodules or hyperthyroid). Produce no thyroid hormone (cold nodules or hypothyroid). Form from cancer cells (thyroid cancers). What are the causes? In most cases, the cause of this condition is not known. What increases the risk? The following factors may make you more likely to develop this condition. Age. Thyroid nodules become more common in people who are older than 81 years of age. Gender. Benign thyroid nodules are more common in women. Cancerous (malignant) thyroid nodules are more common in men. A family history that includes: Thyroid nodules. Pheochromocytoma. Thyroid carcinoma. Hyperparathyroidism. Certain kinds of thyroid diseases, such as Hashimoto's thyroiditis. Lack of iodine in your diet. A history of head and neck radiation, such as from previous cancer treatment. What are the signs or symptoms? In many cases, there are no symptoms. If you have symptoms, they may include: A lump in your lower neck. Feeling a lump or tickle in your throat. Pain in your neck, jaw, or ear. Having trouble swallowing. Hot nodules may cause symptoms that include: Weight loss. Warm, flushed skin. Feeling hot. Feeling nervous. A racing heartbeat. Cold nodules may cause symptoms that include: Weight gain. Dry skin. Brittle hair. This may also occur with hair  loss. Feeling cold. Fatigue. Thyroid cancer nodules may cause symptoms that include: Hard nodules that feel stuck to the thyroid gland. Hoarseness. Lumps in the glands near your thyroid (lymph nodes). How is this diagnosed? A thyroid nodule may be felt by your health care provider during a physical exam. This condition may also be diagnosed based on your symptoms. You may also have tests, including: An ultrasound. This may be done to confirm the diagnosis. A biopsy. This involves taking a sample from the nodule and looking at it under a microscope. Blood tests to make sure that your thyroid is working properly. A thyroid scan. This test uses a radioactive tracer injected into a vein to create an image of the thyroid gland on a computer screen. Imaging tests such as MRI or CT scan. These may be done if: Your nodule is large. Your nodule is blocking your airway. Cancer is suspected. How is this treated? Treatment depends on the cause and size of your nodule or nodules. If the nodule is benign, treatment may not be necessary. Your health care provider may monitor the nodule to see if it goes away without treatment. If the nodule continues to grow, is cancerous, or does not go away, treatment may be needed. Treatment may include: Having a cystic nodule drained with a needle. Ablation therapy. In this treatment, alcohol is injected into the area of the nodule to destroy the cells. Ablation with heat (thermal ablation) may also be used. Radioactive iodine. In this treatment, radioactive iodine is given as a pill or liquid that you drink. This substance causes the thyroid nodule to shrink. Surgery to remove the nodule. Part or all of your thyroid gland may  need to be removed as well. Medicines. Follow these instructions at home: Pay attention to any changes in your nodule. Take over-the-counter and prescription medicines only as told by your health care provider. Keep all follow-up visits as told  by your health care provider. This is important. Contact a health care provider if: Your voice changes. You have trouble swallowing. You have pain in your neck, ear, or jaw that is getting worse. Your nodule gets bigger. Your nodule starts to make it harder for you to breathe. Your muscles look like they are shrinking (muscle wasting). Get help right away if: You have chest pain. There is a loss of consciousness. You have a sudden fever. You feel confused. You are seeing or hearing things that other people do not see or hear (having hallucinations). You feel very weak. You have mood swings. You feel very restless. You feel suddenly nauseous or throw up. You suddenly have diarrhea. Summary A thyroid nodule is an isolated growth of thyroid cells that forms a lump in your thyroid gland. Thyroid nodules are common. Most are not cancerous (benign). You may have one nodule or several nodules. Treatment depends on the cause and size of your nodule or nodules. If the nodule is benign, treatment may not be necessary. Your health care provider may monitor the nodule to see if it goes away without treatment. If the nodule continues to grow, is cancerous, or does not go away, treatment may be needed. This information is not intended to replace advice given to you by your health care provider. Make sure you discuss any questions you have with your healthcare provider. Document Revised: 02/09/2018 Document Reviewed: 02/12/2018 [employer] Patient Education  [place].

## 2021-02-02 ENCOUNTER — Other Ambulatory Visit: Payer: Self-pay

## 2021-02-02 ENCOUNTER — Other Ambulatory Visit: Payer: Self-pay | Admitting: Student

## 2021-02-02 ENCOUNTER — Ambulatory Visit (INDEPENDENT_AMBULATORY_CARE_PROVIDER_SITE_OTHER): Payer: Medicare HMO | Admitting: Nurse Practitioner

## 2021-02-02 ENCOUNTER — Encounter: Payer: Self-pay | Admitting: Nurse Practitioner

## 2021-02-02 VITALS — BP 138/67 | HR 70 | Ht 62.0 in | Wt 122.0 lb

## 2021-02-02 DIAGNOSIS — E039 Hypothyroidism, unspecified: Secondary | ICD-10-CM

## 2021-02-02 DIAGNOSIS — E041 Nontoxic single thyroid nodule: Secondary | ICD-10-CM

## 2021-02-02 MED ORDER — LEVOTHYROXINE SODIUM 25 MCG PO TABS: 25.0000 ug | ORAL_TABLET | Freq: Every day | ORAL | 0 refills | Status: DC

## 2021-02-02 NOTE — Progress Notes (Signed)
Endocrinology Follow Up Note                                         02/02/2021, 3:10 PM  Subjective:   Subjective    Shawna [name] is a 80 y.o.-year-old female patient being seen in follow up after being seen in consultation for hypothyroidism referred by Practice, [facility].   Past Medical History:  Diagnosis Date   Acute on chronic respiratory failure with hypoxia ([place]) 10/10/2016   Anxiety    Arthritis    AVM (arteriovenous malformation) of colon    CAD (coronary artery disease)    a. s/p CABG in 2013 b. DES to D1 in 10/2016. c. cath in 07/2018 showing patent grafts with occlusion of D1 at prior stent site and progression of PDA disease --> medical management recommended   Carotid artery disease ([place])    a. 13-08% LICA, 12/5782    Chronic anemia    Chronic bronchitis (HCC)    Chronic diastolic CHF (congestive heart failure) ([place])    a. 02/2012 Echo EF 60-65%, nl wall motion, Gr 1 DD, mod MR   Colon cancer ([place]) 1992   Esophageal stricture    ESRD on hemodialysis ([place])    ESRD due to HTN, started dialysis 2011 and gets HD at [facility] with Dr Hinda Lenis on MWF schedule.  Access is LUA AVF as of Sept 2014.    GERD (gastroesophageal reflux disease)    High cholesterol 12/2011   History of blood transfusion 07/2011; 12/2011; 01/2012 X 2; 04/2012   History of gout    History of lower GI bleeding    Hypertension    Iron deficiency anemia    Mitral regurgitation    a. Moderate by echo, 02/2012   Myocardial infarction [facility])    NSVT (nonsustained ventricular tachycardia) ([place])    Ovarian cancer ([place]) 1992   PAF (paroxysmal atrial fibrillation) ([place])    Pneumonia ~ 2009   PUD (peptic ulcer disease)    TIA (transient ischemic attack)     Past Surgical History:  Procedure Laterality Date   A/V SHUNTOGRAM Left 03/19/2019   Procedure: A/V SHUNTOGRAM;  Surgeon: Katha Cabal, MD;  Location: [place] [facility]  LAB;  Service: Cardiovascular;  Laterality: Left;   ABDOMINAL HYSTERECTOMY  1992   APPENDECTOMY  06/1990   AV FISTULA PLACEMENT  07/2009   left upper arm   AV FISTULA PLACEMENT Right 09/06/2016   Procedure: RIGHT FOREARM ARTERIOVENOUS (AV) GRAFT;  Surgeon: Elam Dutch, MD;  Location: [place];  Service: Vascular;  Laterality: Right;   AV FISTULA PLACEMENT N/A 02/24/2017   Procedure: INSERTION OF ARTERIOVENOUS (AV) GORE-TEX GRAFT ARM (BRACHIAL AXILLARY);  Surgeon: Katha Cabal, MD;  Location: [facility] ORS;  Service: Vascular;  Laterality: N/A;   [place] Right 09/06/2016   Procedure: REMOVAL OF Right Arm ARTERIOVENOUS GORETEX GRAFT and Vein Patch angioplasty of brachial artery;  Surgeon: Angelia Mould, MD;  Location: [place];  Service: Vascular;  Laterality:  Right;   BIOPSY  09/26/2019   Procedure: BIOPSY;  Surgeon: Rogene Houston, MD;  [facility];  Service: Endoscopy;;   COLON RESECTION  1992   COLON SURGERY     COLONOSCOPY N/A 03/09/2019   Procedure: COLONOSCOPY;  Surgeon: Rogene Houston, MD;  [facility];  Service: Endoscopy;  Laterality: N/A;   CORONARY ANGIOPLASTY WITH STENT PLACEMENT  12/15/11   "2"   CORONARY ANGIOPLASTY WITH STENT PLACEMENT  y/2013   "1; makes total of 3" (05/02/2012)   CORONARY ARTERY BYPASS GRAFT  06/13/2012   Procedure: CORONARY ARTERY BYPASS GRAFTING (CABG);  Surgeon: Grace Isaac, MD;  Location: [place];  Service: Open Heart Surgery;  Laterality: N/A;  cabg x four;  using left internal mammary artery, and left leg greater saphenous vein harvested endoscopically   CORONARY STENT INTERVENTION N/A 10/13/2016   Procedure: Coronary Stent Intervention;  Surgeon: Troy Sine, MD;  Location: [place] CV LAB;  Service: Cardiovascular;  Laterality: N/A;   DIALYSIS/PERMA CATHETER REMOVAL N/A 04/18/2017   Procedure: DIALYSIS/PERMA CATHETER REMOVAL;  Surgeon: Katha Cabal, MD;  Location: [place] [facility] LAB;  Service:  Cardiovascular;  Laterality: N/A;   DILATION AND CURETTAGE OF UTERUS     ESOPHAGOGASTRODUODENOSCOPY  01/20/2012   Procedure: ESOPHAGOGASTRODUODENOSCOPY (EGD);  Surgeon: Ladene Artist, MD,FACG;  Location: [facility] ENDOSCOPY;  Service: Endoscopy;  Laterality: N/A;   ESOPHAGOGASTRODUODENOSCOPY N/A 03/26/2013   Procedure: ESOPHAGOGASTRODUODENOSCOPY (EGD);  Surgeon: Irene Shipper, MD;  Location: [facility] ENDOSCOPY;  Service: Endoscopy;  Laterality: N/A;   ESOPHAGOGASTRODUODENOSCOPY N/A 04/30/2015   Procedure: ESOPHAGOGASTRODUODENOSCOPY (EGD);  Surgeon: Rogene Houston, MD;  [facility];  Service: Endoscopy;  Laterality: N/A;  1pm - moved to 10/20 @ 1:10   ESOPHAGOGASTRODUODENOSCOPY N/A 07/29/2016   Procedure: ESOPHAGOGASTRODUODENOSCOPY (EGD);  Surgeon: Manus Gunning, MD;  Location: [place];  Service: Gastroenterology;  Laterality: N/A;  enteroscopy   ESOPHAGOGASTRODUODENOSCOPY N/A 09/26/2019   Procedure: ESOPHAGOGASTRODUODENOSCOPY (EGD);  Surgeon: Rogene Houston, MD;  [facility];  Service: Endoscopy;  Laterality: N/A;  [place] 03/07/2019   Procedure: GIVENS CAPSULE STUDY;  Surgeon: Rogene Houston, MD;  [facility];  Service: Endoscopy;  Laterality: N/A;  7:30   INSERTION OF DIALYSIS CATHETER N/A 10/05/2020   Procedure: ABORTED TUNNELED DIALYSIS CATHETER PLACEMENT RIGHT INTERNAL JUGULAR VEIN ;  Surgeon: Virl Cagey, MD;  Location: [facility];  Service: General;  Laterality: N/A;   INTRAOPERATIVE TRANSESOPHAGEAL ECHOCARDIOGRAM  06/13/2012   Procedure: INTRAOPERATIVE TRANSESOPHAGEAL ECHOCARDIOGRAM;  Surgeon: Grace Isaac, MD;  Location: [place];  Service: Open Heart Surgery;  Laterality: N/A;   IR DIALY SHUNT INTRO NEEDLE/INTRACATH INITIAL W/IMG LEFT Left 10/06/2020   IR FLUORO GUIDE CV LINE RIGHT  06/17/2020   IR GENERIC HISTORICAL  07/26/2016   IR FLUORO GUIDE CV LINE RIGHT 07/26/2016 Greggory Keen, MD [facility]   IR GENERIC  HISTORICAL  07/26/2016   IR US GUIDE VASC ACCESS RIGHT 07/26/2016 Greggory Keen, MD [facility]   IR GENERIC HISTORICAL  08/02/2016   IR US GUIDE VASC ACCESS RIGHT 08/02/2016 Greggory Keen, MD [facility]   IR GENERIC HISTORICAL  08/02/2016   IR FLUORO GUIDE CV LINE RIGHT 08/02/2016 Greggory Keen, MD [facility]   IR RADIOLOGY PERIPHERAL GUIDED IV START  03/28/2017   IR REMOVAL TUN CV CATH W/O FL  08/11/2020   IR THROMBECTOMY AV FISTULA W/THROMBOLYSIS INC/SHUNT/IMG LEFT Left 06/17/2020  IR US GUIDE VASC ACCESS LEFT  06/17/2020   IR US GUIDE VASC ACCESS RIGHT  03/28/2017   IR US GUIDE VASC ACCESS RIGHT  06/17/2020   LEFT HEART CATH AND CORONARY ANGIOGRAPHY N/A 09/20/2016   Procedure: Left Heart Cath and Coronary Angiography;  Surgeon: [name], MD;  Location: [place] CV LAB;  Service: Cardiovascular;  Laterality: N/A;   LEFT HEART CATH AND CORS/GRAFTS ANGIOGRAPHY N/A 10/13/2016   Procedure: Left Heart Cath and Cors/Grafts Angiography;  Surgeon: Troy Sine, MD;  Location: [place] [facility] LAB;  Service: Cardiovascular;  Laterality: N/A;   LEFT HEART CATH AND CORS/GRAFTS ANGIOGRAPHY N/A 07/13/2018   Procedure: LEFT HEART CATH AND CORS/GRAFTS ANGIOGRAPHY;  Surgeon: Martinique, Peter M, MD;  Location: [place] [facility] LAB;  Service: Cardiovascular;  Laterality: N/A;   LEFT HEART CATHETERIZATION WITH CORONARY ANGIOGRAM N/A 12/15/2011   Procedure: LEFT HEART CATHETERIZATION WITH CORONARY ANGIOGRAM;  Surgeon: Burnell Blanks, MD;  Location: [facility] CATH LAB;  Service: Cardiovascular;  Laterality: N/A;   LEFT HEART CATHETERIZATION WITH CORONARY ANGIOGRAM N/A 01/10/2012   Procedure: LEFT HEART CATHETERIZATION WITH CORONARY ANGIOGRAM;  Surgeon: Peter M Martinique, MD;  Location: [facility] CATH LAB;  Service: Cardiovascular;  Laterality: N/A;   LEFT HEART CATHETERIZATION WITH CORONARY ANGIOGRAM N/A 06/08/2012   Procedure: LEFT HEART CATHETERIZATION WITH CORONARY ANGIOGRAM;  Surgeon: Burnell Blanks, MD;  Location: [facility]  CATH LAB;  Service: Cardiovascular;  Laterality: N/A;   LEFT HEART CATHETERIZATION WITH CORONARY/GRAFT ANGIOGRAM N/A 12/10/2013   Procedure: LEFT HEART CATHETERIZATION WITH Beatrix Fetters;  Surgeon: [name], MD;  Location: [facility] CATH LAB;  Service: Cardiovascular;  Laterality: N/A;   OVARY SURGERY     ovarian cancer   POLYPECTOMY  03/09/2019   Procedure: POLYPECTOMY;  Surgeon: Rogene Houston, MD;  [facility];  Service: Endoscopy;;  cecal    POLYPECTOMY N/A 09/26/2019   Procedure: DUODENAL POLYPECTOMY;  Surgeon: Rogene Houston, MD;  [facility];  Service: Endoscopy;  Laterality: N/A;   REVISION OF ARTERIOVENOUS GORETEX GRAFT N/A 02/24/2017   Procedure: REVISION OF ARTERIOVENOUS GORETEX GRAFT (RESECTION);  Surgeon: Katha Cabal, MD;  Location: [facility] ORS;  Service: Vascular;  Laterality: N/A;   REVISON OF ARTERIOVENOUS FISTULA Left 06/19/2020   Procedure: REVISION OF LEFT UPPER ARM AV GRAFT WITH INTERPOSITION JUMP GRAFT USING 6MM GORE LIMB;  Surgeon: Marty Heck, MD;  Location: [place];  Service: Vascular;  Laterality: Left;   SHUNTOGRAM N/A 10/15/2013   Procedure: Fistulogram;  Surgeon: Serafina Mitchell, MD;  Location: [facility] CATH LAB;  Service: Cardiovascular;  Laterality: N/A;   THROMBECTOMY / ARTERIOVENOUS GRAFT REVISION  2011   left upper arm   TUBAL LIGATION  1980's   UPPER EXTREMITY ANGIOGRAPHY Bilateral 12/06/2016   Procedure: Upper Extremity Angiography;  Surgeon: Katha Cabal, MD;  Location: [place] CV LAB;  Service: Cardiovascular;  Laterality: Bilateral;   UPPER EXTREMITY INTERVENTION Left 06/06/2017   Procedure: UPPER EXTREMITY INTERVENTION;  Surgeon: Katha Cabal, MD;  Location: [place] CV LAB;  Service: Cardiovascular;  Laterality: Left;    Social History   Socioeconomic History   Marital status: Married    Spouse name: [name]   Number of children: Not on file   Years of education: Not on file    Highest education level: Not on file  Occupational History   Not on file  Tobacco Use   Smoking status: Never   Smokeless tobacco: Never  Vaping Use  Vaping Use: Never used  Substance and Sexual Activity   Alcohol use: No    Alcohol/week: 0.0 standard drinks   Drug use: No   Sexual activity: Yes    Birth control/protection: Surgical  Other Topics Concern   Not on file  Social History Narrative   Lives in [place], [place] with husband.  Dialysis pt - mwf.   Social Determinants of Health   Financial Resource Strain: Not on file  Food Insecurity: Not on file  Transportation Needs: Not on file  Physical Activity: Not on file  Stress: Not on file  Social Connections: Not on file    Family History  Problem Relation Age of Onset   Heart disease Mother        Heart Disease before age 64   Hyperlipidemia Mother    Hypertension Mother    Diabetes Mother    Heart attack Mother    Heart disease Father        Heart Disease before age 107   Hyperlipidemia Father    Hypertension Father    Diabetes Father    Diabetes Sister    Hypertension Sister    Diabetes Brother    Hyperlipidemia Brother    Heart attack Brother    Hypertension Sister    Heart attack Brother    Colon cancer Child 29   Other Other        noncontributory for early CAD   Esophageal cancer Neg Hx    Liver disease Neg Hx    Kidney disease Neg Hx    Colon polyps Neg Hx     Outpatient Encounter Medications as of 02/02/2021  Medication Sig   aspirin EC 81 MG tablet Take 81 mg by mouth daily.    atorvastatin (LIPITOR) 10 MG tablet Take 10 mg by mouth daily.   cyclobenzaprine (FLEXERIL) 5 MG tablet Take 5 mg by mouth 3 (three) times daily as needed for muscle spasms.   fluticasone (FLONASE) 50 MCG/ACT nasal spray Place 1 spray into both nostrils at bedtime as needed for allergies.    hydrOXYzine (VISTARIL) 100 MG capsule Take 100 mg by mouth 2 (two) times daily as needed for anxiety or itching.   isosorbide  mononitrate (IMDUR) 60 MG 24 hr tablet Take 1 tablet (60 mg total) by mouth in the morning and at bedtime.   lidocaine-prilocaine (EMLA) cream Apply 1 application topically every Monday, Wednesday, and Friday. Prior to dialysis   loperamide (IMODIUM A-D) 2 MG tablet Take 2 tablets (4 mg total) by mouth daily as needed for diarrhea or loose stools.   loratadine (CLARITIN) 10 MG tablet Take 1 tablet (10 mg total) by mouth daily as needed for allergies.   metoprolol tartrate (LOPRESSOR) 25 MG tablet Take 1 tablet (25 mg total) by mouth 2 (two) times daily.   midodrine (PROAMATINE) 5 MG tablet Take Monday, Wednesday, Friday morning BEFORE dialysis   multivitamin (RENA-VIT) TABS tablet Take 1 tablet by mouth daily.   nitroGLYCERIN (NITROSTAT) 0.4 MG SL tablet Place 1 tablet (0.4 mg total) under the tongue every 5 (five) minutes x 3 doses as needed for chest pain (if no relief after 2nd dose, proceed to the ED for an evaluation or call 911).   omeprazole (PRILOSEC) 20 MG capsule Take 1 capsule (20 mg total) by mouth daily.   sevelamer carbonate (RENVELA) 800 MG tablet Take 2 tablets (1,600 mg total) by mouth 3 (three) times daily with meals.   triamcinolone (KENALOG) 0.025 % ointment Apply 1  application topically 2 (two) times daily as needed (itching).   triamcinolone cream (KENALOG) 0.1 % Apply 1 application topically 2 (two) times daily as needed (itching).   No facility-administered encounter medications on file as of 02/02/2021.    ALLERGIES: Allergies  Allergen Reactions   Aspirin Other (See Comments)    High Doses Mess up her stomach; "makes my bowels have blood in them". Takes 81 mg EC Aspirin    Penicillins Other (See Comments)    SYNCOPE? , "makes me real weak when I take it; like I'll pass out"  Has patient had a PCN reaction causing immediate rash, facial/tongue/throat swelling, SOB or lightheadedness with hypotension: Yes Has patient had a PCN reaction causing severe rash involving  mucus membranes or skin necrosis: no Has patient had a PCN reaction that required hospitalization no Has patient had a PCN reaction occurring within the last 10 years: no If all of the above   Amlodipine Swelling   Bactrim [Sulfamethoxazole-Trimethoprim] Rash   Contrast Media [Iodinated Diagnostic Agents] Itching   Iron Itching and Other (See Comments)    "they gave me iron in dialysis; had to give me Benadryl cause I had to have the iron" (05/02/2012)   Nitrofurantoin Hives   Tylenol [Acetaminophen] Itching and Other (See Comments)    Makes her feet on fire per pt   Gabapentin Other (See Comments)    Unknown reaction   Hydralazine Itching    Has tolerated while inpatient   Levofloxacin    No Known Allergies    Ranexa [Ranolazine] Other (See Comments)    Myoclonus-hospitalized    Dexilant [Dexlansoprazole] Other (See Comments)    Upset stomach   Levaquin [Levofloxacin In D5w] Rash   Morphine And Related Itching and Other (See Comments)    Itching in feet   Plavix [Clopidogrel Bisulfate] Rash   Protonix [Pantoprazole Sodium] Rash   Venofer [Ferric Oxide] Itching and Other (See Comments)    Patient reports using Benadryl prior to doses as [place]: Immunization History  Administered Date(s) Administered   Influenza-Unspecified 03/11/2012   Moderna Sars-Covid-2 Vaccination 09/04/2019, 10/04/2019   Pneumococcal Polysaccharide-23 05/07/2020   Pneumococcal-Unspecified 03/11/2008   Tdap 01/31/2018     HPI   Shawna [name]  is a patient with the above medical history. she was diagnosed with hypothyroidism recently during a hospitalization, which prompted her referral here.  She is a resident at a nursing home and her husband is accompanying her today.  She is not currently on any medication for her thyroid nor has she taken anything for her thyroid in the past.    I reviewed patient's thyroid tests:  Lab Results  Component Value Date   TSH 6.08 (A)  01/26/2021   TSH 5.174 (H) 12/16/2020   TSH 16.122 (H) 12/15/2020   TSH 9.193 (H) 12/03/2020   TSH 9.938 (H) 11/09/2020   TSH 7.243 (H) 08/30/2020   TSH 5.792 (H) 06/14/2020   TSH 1.536 05/19/2016   TSH 10.500 (H) 05/09/2016   TSH 0.999 09/08/2015   FREET4 1.08 12/16/2020   FREET4 1.14 (H) 12/15/2020   FREET4 0.98 12/03/2020   FREET4 0.80 11/09/2020   FREET4 0.66 06/15/2020   FREET4 0.72 05/09/2016     Patient denies any definitive symptoms associated with hypothyroidism aside from dry skin and occasional feelings of a lump in her throat or difficulty swallowing.  Pt denies feeling nodules in neck, hoarseness, odynophagia, SOB with lying down.  she denies family  history of thyroid disorders.  No family history of thyroid cancer.  No history of radiation therapy to head or neck.  No recent use of iodine supplements.  Denies use of Biotin containing supplements.  She has never had any imaging of her thyroid in the past.  I reviewed her chart and she also has a history of Amiodarone use a few years back and is an ESRD patient on dialysis.   ROS:  Constitutional: no weight gain/loss, no fatigue, no subjective hyperthermia, no subjective hypothermia Eyes: no blurry vision, no xerophthalmia ENT: no sore throat, no nodules palpated in throat, intermittent dysphagia, no odynophagia, no hoarseness Cardiovascular: no chest pain, no SOB, no palpitations, no leg swelling Respiratory: no cough, no SOB Gastrointestinal: no nausea/vomiting/diarrhea Musculoskeletal: no muscle/joint aches, walks with cane Skin: no rashes, + dry skin Neurological: no tremors, no numbness, no tingling, no dizziness Psychiatric: no depression, no anxiety   Objective:   Objective     Ht 5\' 2"  (1.575 m)   Wt 122 lb (55.3 kg)   BMI 22.31 kg/m  Wt Readings from Last 3 Encounters:  02/02/21 122 lb (55.3 kg)  01/22/21 125 lb 10.6 oz (57 kg)  01/07/21 124 lb (56.2 kg)    BP Readings from Last 3  Encounters:  01/22/21 (!) 153/51  01/07/21 (!) 177/72  12/22/20 (!) 130/58     Physical Exam- Limited  Constitutional:  Body mass index is 22.31 kg/m. , not in acute distress, normal state of mind Eyes:  EOMI, no exophthalmos Neck: Supple Cardiovascular: RRR, + murmurs, rubs, or gallops, no edema Respiratory: Adequate breathing efforts, no crackles, rales, rhonchi, or wheezing Musculoskeletal: no gross deformities, strength intact in all four extremities, no gross restriction of joint movements, walks with cane Skin:  no rashes, no hyperemia Neurological: no tremor with outstretched hands    CMP ( most recent) CMP     Component Value Date/Time   NA 137 01/22/2021 0201   K 3.9 01/22/2021 0201   CL 101 01/22/2021 0201   CO2 27 01/22/2021 0201   GLUCOSE 91 01/22/2021 0201   BUN 22 01/22/2021 0201   CREATININE 6.73 (H) 01/22/2021 0201   CREATININE 6.57 (H) 03/05/2019 1159   CALCIUM 10.8 (H) 01/22/2021 0201   CALCIUM 8.1 (L) 05/29/2013 1814   PROT 6.6 12/18/2020 1425   ALBUMIN 3.1 (L) 12/18/2020 1425   AST 27 12/18/2020 1425   ALT 17 12/18/2020 1425   ALKPHOS 52 12/18/2020 1425   BILITOT 0.5 12/18/2020 1425   GFRNONAA 6 (L) 01/22/2021 0201   GFRNONAA 6 (L) 03/05/2019 1159   GFRAA 9 (L) 02/12/2020 1712   GFRAA 6 (L) 03/05/2019 1159     Diabetic Labs (most recent): Lab Results  Component Value Date   HGBA1C 4.3 (L) 11/09/2020   HGBA1C 4.8 06/12/2012     Lipid Panel ( most recent) Lipid Panel     Component Value Date/Time   CHOL 123 11/10/2020 0933   TRIG 87 11/10/2020 0933   HDL 58 11/10/2020 0933   CHOLHDL 2.1 11/10/2020 0933   VLDL 17 11/10/2020 0933   LDLCALC 48 11/10/2020 0933       Lab Results  Component Value Date   TSH 6.08 (A) 01/26/2021   TSH 5.174 (H) 12/16/2020   TSH 16.122 (H) 12/15/2020   TSH 9.193 (H) 12/03/2020   TSH 9.938 (H) 11/09/2020   TSH 7.243 (H) 08/30/2020   TSH 5.792 (H) 06/14/2020   TSH 1.536 05/19/2016   TSH 10.500 (  H)  05/09/2016   TSH 0.999 09/08/2015   FREET4 1.08 12/16/2020   FREET4 1.14 (H) 12/15/2020   FREET4 0.98 12/03/2020   FREET4 0.80 11/09/2020   FREET4 0.66 06/15/2020   FREET4 0.72 05/09/2016     Thyroid Ultrasound from 01/21/21 CLINICAL DATA:  Hypothyroidism   EXAM: THYROID ULTRASOUND   TECHNIQUE: Ultrasound examination of the thyroid gland and adjacent soft tissues was performed.   COMPARISON:  None.   FINDINGS: Parenchymal Echotexture: Mildly heterogeneous   Isthmus: 0.3 cm   Right lobe: 4.5 x 2.0 x 1.6 cm   Left lobe: 3.9 x 1.8 x 1.7 cm   _________________________________________________________   Estimated total number of nodules >/= 1 cm: 1   Number of spongiform nodules >/=  2 cm not described below (TR1): 0   Number of mixed cystic and solid nodules >/= 1.5 cm not described below (TR2): 0   _________________________________________________________   1.2 x 1.0 x 0.5 cm spongiform nodule in the inferior left thyroid lobe does not meet criteria for FNA or imaging surveillance.   IMPRESSION: No suspicious thyroid nodules.   The above is in keeping with the ACR TI-RADS recommendations - J Am Coll Radiol 2017;14:587-595.     Electronically Signed   By: Miachel Roux M.D.   On: 01/21/2021 13:44  Results for Shawna [name], Shawna [name] (MRN [id number]) as of 02/02/2021 15:05  Ref. Range 12/03/2020 02:01 12/03/2020 10:18 12/15/2020 01:57 12/16/2020 14:09 01/26/2021 00:00  TSH Latest Ref Range: 0.41 - 5.90  9.193 (H)  16.122 (H) 5.174 (H) 6.08 (A)  Triiodothyronine (T3) Latest Ref Range: 71 - 180 ng/dL  129 249 (H) 101   T4,Free(Direct) Latest Ref Range: 0.61 - 1.12 ng/dL  0.98 1.14 (H) 1.08     Assessment & Plan:   ASSESSMENT / PLAN:  1. Hypothyroidism-unspecified (amiodarone induced vs autoimmune?)  Patient has newly diagnosed hypothyroidism, not currently on any thyroid hormone replacement.  I discussed and initiated Levothyroxine 25 mcg po daily before breakfast.  - We  discussed about correct intake of levothyroxine, at fasting, with water, separated by at least 30 minutes from breakfast, and separated by more than 4 hours from calcium, iron, multivitamins, acid reflux medications (PPIs). -Patient is made aware of the fact that thyroid hormone replacement is needed for life, dose to be adjusted by periodic monitoring of thyroid function tests.  - I had ordered more comprehensive labs to be done prior to the visit, but only the TSH was performed.  Will check more comprehensive thyroid tests before next visit: TSH, free T4, FT3 and antibody testing for autoimmune thyroid dysfunction.  -Her thyroid ultrasound did show mild heterogeneous thyroid gland with small nodule which does not meet criteria for biopsy or dedicated follow up.       I spent 25 minutes in the care of the patient today including review of labs from Thyroid Function, CMP, and other relevant labs ; imaging/biopsy records (current and previous including abstractions from other facilities); face-to-face time discussing  her lab results and symptoms, medications doses, her options of short and long term treatment based on the latest standards of care / guidelines;   and documenting the encounter.  [name]  participated in the discussions, expressed understanding, and voiced agreement with the above plans.  All questions were answered to her satisfaction. she is encouraged to contact clinic should she have any questions or concerns prior to her return visit.   FOLLOW UP PLAN:  Return in about 2 months (  around 04/05/2021) for Thyroid follow up, Previsit labs.  Rayetta Pigg, [facility] [address] [place], [place] [address] Phone: [phone] Fax: [phone]  02/02/2021, 3:10 PM

## 2021-02-03 ENCOUNTER — Inpatient Hospital Stay (HOSPITAL_COMMUNITY)
Admission: EM | Admit: 2021-02-03 | Discharge: 2021-02-07 | DRG: 377 | Disposition: A | Discharge status: Home Health | Payer: Medicare HMO | Attending: Internal Medicine | Admitting: Internal Medicine

## 2021-02-03 ENCOUNTER — Encounter (HOSPITAL_COMMUNITY): Payer: Self-pay | Admitting: *Deleted

## 2021-02-03 ENCOUNTER — Emergency Department (HOSPITAL_COMMUNITY): Payer: Medicare HMO

## 2021-02-03 ENCOUNTER — Other Ambulatory Visit: Payer: Self-pay

## 2021-02-03 DIAGNOSIS — M109 Gout, unspecified: Secondary | ICD-10-CM | POA: Diagnosis present

## 2021-02-03 DIAGNOSIS — Z951 Presence of aortocoronary bypass graft: Secondary | ICD-10-CM

## 2021-02-03 DIAGNOSIS — I10 Essential (primary) hypertension: Secondary | ICD-10-CM | POA: Diagnosis not present

## 2021-02-03 DIAGNOSIS — U071 COVID-19: Secondary | ICD-10-CM | POA: Diagnosis present

## 2021-02-03 DIAGNOSIS — K2970 Gastritis, unspecified, without bleeding: Secondary | ICD-10-CM | POA: Diagnosis not present

## 2021-02-03 DIAGNOSIS — E8809 Other disorders of plasma-protein metabolism, not elsewhere classified: Secondary | ICD-10-CM

## 2021-02-03 DIAGNOSIS — K589 Irritable bowel syndrome without diarrhea: Secondary | ICD-10-CM | POA: Diagnosis not present

## 2021-02-03 DIAGNOSIS — I480 Paroxysmal atrial fibrillation: Secondary | ICD-10-CM | POA: Diagnosis present

## 2021-02-03 DIAGNOSIS — Z955 Presence of coronary angioplasty implant and graft: Secondary | ICD-10-CM | POA: Diagnosis not present

## 2021-02-03 DIAGNOSIS — D72829 Elevated white blood cell count, unspecified: Secondary | ICD-10-CM

## 2021-02-03 DIAGNOSIS — Z91041 Radiographic dye allergy status: Secondary | ICD-10-CM

## 2021-02-03 DIAGNOSIS — M1990 Unspecified osteoarthritis, unspecified site: Secondary | ICD-10-CM | POA: Diagnosis present

## 2021-02-03 DIAGNOSIS — R0902 Hypoxemia: Secondary | ICD-10-CM

## 2021-02-03 DIAGNOSIS — I5042 Chronic combined systolic (congestive) and diastolic (congestive) heart failure: Secondary | ICD-10-CM | POA: Diagnosis present

## 2021-02-03 DIAGNOSIS — K922 Gastrointestinal hemorrhage, unspecified: Secondary | ICD-10-CM | POA: Diagnosis not present

## 2021-02-03 DIAGNOSIS — I252 Old myocardial infarction: Secondary | ICD-10-CM

## 2021-02-03 DIAGNOSIS — B3781 Candidal esophagitis: Secondary | ICD-10-CM | POA: Diagnosis not present

## 2021-02-03 DIAGNOSIS — E46 Unspecified protein-calorie malnutrition: Secondary | ICD-10-CM

## 2021-02-03 DIAGNOSIS — Z880 Allergy status to penicillin: Secondary | ICD-10-CM

## 2021-02-03 DIAGNOSIS — D62 Acute posthemorrhagic anemia: Secondary | ICD-10-CM

## 2021-02-03 DIAGNOSIS — Z8249 Family history of ischemic heart disease and other diseases of the circulatory system: Secondary | ICD-10-CM

## 2021-02-03 DIAGNOSIS — Z881 Allergy status to other antibiotic agents status: Secondary | ICD-10-CM

## 2021-02-03 DIAGNOSIS — K219 Gastro-esophageal reflux disease without esophagitis: Secondary | ICD-10-CM | POA: Diagnosis present

## 2021-02-03 DIAGNOSIS — E039 Hypothyroidism, unspecified: Secondary | ICD-10-CM | POA: Diagnosis not present

## 2021-02-03 DIAGNOSIS — Z8673 Personal history of transient ischemic attack (TIA), and cerebral infarction without residual deficits: Secondary | ICD-10-CM

## 2021-02-03 DIAGNOSIS — J810 Acute pulmonary edema: Secondary | ICD-10-CM | POA: Diagnosis not present

## 2021-02-03 DIAGNOSIS — I5022 Chronic systolic (congestive) heart failure: Secondary | ICD-10-CM | POA: Diagnosis present

## 2021-02-03 DIAGNOSIS — N186 End stage renal disease: Secondary | ICD-10-CM | POA: Diagnosis present

## 2021-02-03 DIAGNOSIS — I160 Hypertensive urgency: Secondary | ICD-10-CM | POA: Diagnosis present

## 2021-02-03 DIAGNOSIS — I2510 Atherosclerotic heart disease of native coronary artery without angina pectoris: Secondary | ICD-10-CM | POA: Diagnosis present

## 2021-02-03 DIAGNOSIS — Z800 Family history of malignant neoplasm of digestive organs: Secondary | ICD-10-CM

## 2021-02-03 DIAGNOSIS — Z79890 Hormone replacement therapy (postmenopausal): Secondary | ICD-10-CM

## 2021-02-03 DIAGNOSIS — I5032 Chronic diastolic (congestive) heart failure: Secondary | ICD-10-CM | POA: Diagnosis present

## 2021-02-03 DIAGNOSIS — F419 Anxiety disorder, unspecified: Secondary | ICD-10-CM | POA: Diagnosis present

## 2021-02-03 DIAGNOSIS — I779 Disorder of arteries and arterioles, unspecified: Secondary | ICD-10-CM | POA: Diagnosis present

## 2021-02-03 DIAGNOSIS — Z9049 Acquired absence of other specified parts of digestive tract: Secondary | ICD-10-CM | POA: Diagnosis not present

## 2021-02-03 DIAGNOSIS — E119 Type 2 diabetes mellitus without complications: Secondary | ICD-10-CM | POA: Diagnosis present

## 2021-02-03 DIAGNOSIS — J9601 Acute respiratory failure with hypoxia: Secondary | ICD-10-CM | POA: Diagnosis not present

## 2021-02-03 DIAGNOSIS — I132 Hypertensive heart and chronic kidney disease with heart failure and with stage 5 chronic kidney disease, or end stage renal disease: Secondary | ICD-10-CM | POA: Diagnosis present

## 2021-02-03 DIAGNOSIS — Z992 Dependence on renal dialysis: Secondary | ICD-10-CM | POA: Diagnosis not present

## 2021-02-03 DIAGNOSIS — K921 Melena: Secondary | ICD-10-CM | POA: Diagnosis present

## 2021-02-03 DIAGNOSIS — Z85038 Personal history of other malignant neoplasm of large intestine: Secondary | ICD-10-CM | POA: Diagnosis not present

## 2021-02-03 DIAGNOSIS — K625 Hemorrhage of anus and rectum: Secondary | ICD-10-CM

## 2021-02-03 DIAGNOSIS — Z883 Allergy status to other anti-infective agents status: Secondary | ICD-10-CM

## 2021-02-03 DIAGNOSIS — Z83438 Family history of other disorder of lipoprotein metabolism and other lipidemia: Secondary | ICD-10-CM

## 2021-02-03 DIAGNOSIS — R718 Other abnormality of red blood cells: Secondary | ICD-10-CM | POA: Diagnosis not present

## 2021-02-03 DIAGNOSIS — D631 Anemia in chronic kidney disease: Secondary | ICD-10-CM | POA: Diagnosis present

## 2021-02-03 DIAGNOSIS — Z885 Allergy status to narcotic agent status: Secondary | ICD-10-CM

## 2021-02-03 DIAGNOSIS — Z7982 Long term (current) use of aspirin: Secondary | ICD-10-CM

## 2021-02-03 DIAGNOSIS — Z90710 Acquired absence of both cervix and uterus: Secondary | ICD-10-CM | POA: Diagnosis not present

## 2021-02-03 DIAGNOSIS — Z8543 Personal history of malignant neoplasm of ovary: Secondary | ICD-10-CM

## 2021-02-03 DIAGNOSIS — Z886 Allergy status to analgesic agent status: Secondary | ICD-10-CM

## 2021-02-03 DIAGNOSIS — Z79899 Other long term (current) drug therapy: Secondary | ICD-10-CM

## 2021-02-03 DIAGNOSIS — Z888 Allergy status to other drugs, medicaments and biological substances status: Secondary | ICD-10-CM

## 2021-02-03 LAB — RESP PANEL BY RT-PCR (FLU A&B, COVID) ARPGX2
Influenza A by PCR: NEGATIVE
Influenza B by PCR: NEGATIVE
SARS Coronavirus 2 by RT PCR: POSITIVE — AB

## 2021-02-03 LAB — CBC WITH DIFFERENTIAL/PLATELET
Abs Immature Granulocytes: 0.05 10*3/uL (ref 0.00–0.07)
Basophils Absolute: 0.1 10*3/uL (ref 0.0–0.1)
Basophils Relative: 1 %
Eosinophils Absolute: 0.2 10*3/uL (ref 0.0–0.5)
Eosinophils Relative: 2 %
HCT: 28.6 % — ABNORMAL LOW (ref 36.0–46.0)
Hemoglobin: 8.9 g/dL — ABNORMAL LOW (ref 12.0–15.0)
Immature Granulocytes: 1 %
Lymphocytes Relative: 3 %
Lymphs Abs: 0.4 10*3/uL — ABNORMAL LOW (ref 0.7–4.0)
MCH: 37.4 pg — ABNORMAL HIGH (ref 26.0–34.0)
MCHC: 31.1 g/dL (ref 30.0–36.0)
MCV: 120.2 fL — ABNORMAL HIGH (ref 80.0–100.0)
Monocytes Absolute: 0.8 10*3/uL (ref 0.1–1.0)
Monocytes Relative: 7 %
Neutro Abs: 9.5 10*3/uL — ABNORMAL HIGH (ref 1.7–7.7)
Neutrophils Relative %: 86 %
Platelets: 324 10*3/uL (ref 150–400)
RBC: 2.38 MIL/uL — ABNORMAL LOW (ref 3.87–5.11)
RDW: 19.3 % — ABNORMAL HIGH (ref 11.5–15.5)
WBC: 10.9 10*3/uL — ABNORMAL HIGH (ref 4.0–10.5)
nRBC: 0 % (ref 0.0–0.2)

## 2021-02-03 LAB — TYPE AND SCREEN
ABO/RH(D): O POS
Antibody Screen: NEGATIVE

## 2021-02-03 LAB — HEPATIC FUNCTION PANEL
ALT: 11 U/L (ref 0–44)
AST: 12 U/L — ABNORMAL LOW (ref 15–41)
Albumin: 3.2 g/dL — ABNORMAL LOW (ref 3.5–5.0)
Alkaline Phosphatase: 58 U/L (ref 38–126)
Bilirubin, Direct: 0.2 mg/dL (ref 0.0–0.2)
Indirect Bilirubin: 0.6 mg/dL (ref 0.3–0.9)
Total Bilirubin: 0.8 mg/dL (ref 0.3–1.2)
Total Protein: 6.2 g/dL — ABNORMAL LOW (ref 6.5–8.1)

## 2021-02-03 LAB — BASIC METABOLIC PANEL
Anion gap: 11 (ref 5–15)
BUN: 49 mg/dL — ABNORMAL HIGH (ref 8–23)
CO2: 28 mmol/L (ref 22–32)
Calcium: 9.6 mg/dL (ref 8.9–10.3)
Chloride: 95 mmol/L — ABNORMAL LOW (ref 98–111)
Creatinine, Ser: 8.17 mg/dL — ABNORMAL HIGH (ref 0.44–1.00)
GFR, Estimated: 5 mL/min — ABNORMAL LOW (ref >60)
Glucose, Bld: 104 mg/dL — ABNORMAL HIGH (ref 70–99)
Potassium: 5 mmol/L (ref 3.5–5.1)
Sodium: 134 mmol/L — ABNORMAL LOW (ref 135–145)

## 2021-02-03 LAB — PROTIME-INR
INR: 1 (ref 0.8–1.2)
Prothrombin Time: 13.3 seconds (ref 11.4–15.2)

## 2021-02-03 LAB — POC OCCULT BLOOD, ED: Fecal Occult Bld: POSITIVE — AB

## 2021-02-03 IMAGING — DX DG CHEST 1V PORT
1 series · 1 of 1 positions shown · non-contrast
Comparison: [DATE]

CLINICAL DATA: Shortness of breath.  Blood in stool.  Weakness.

EXAM:
PORTABLE CHEST 1 VIEW

[chest ap]
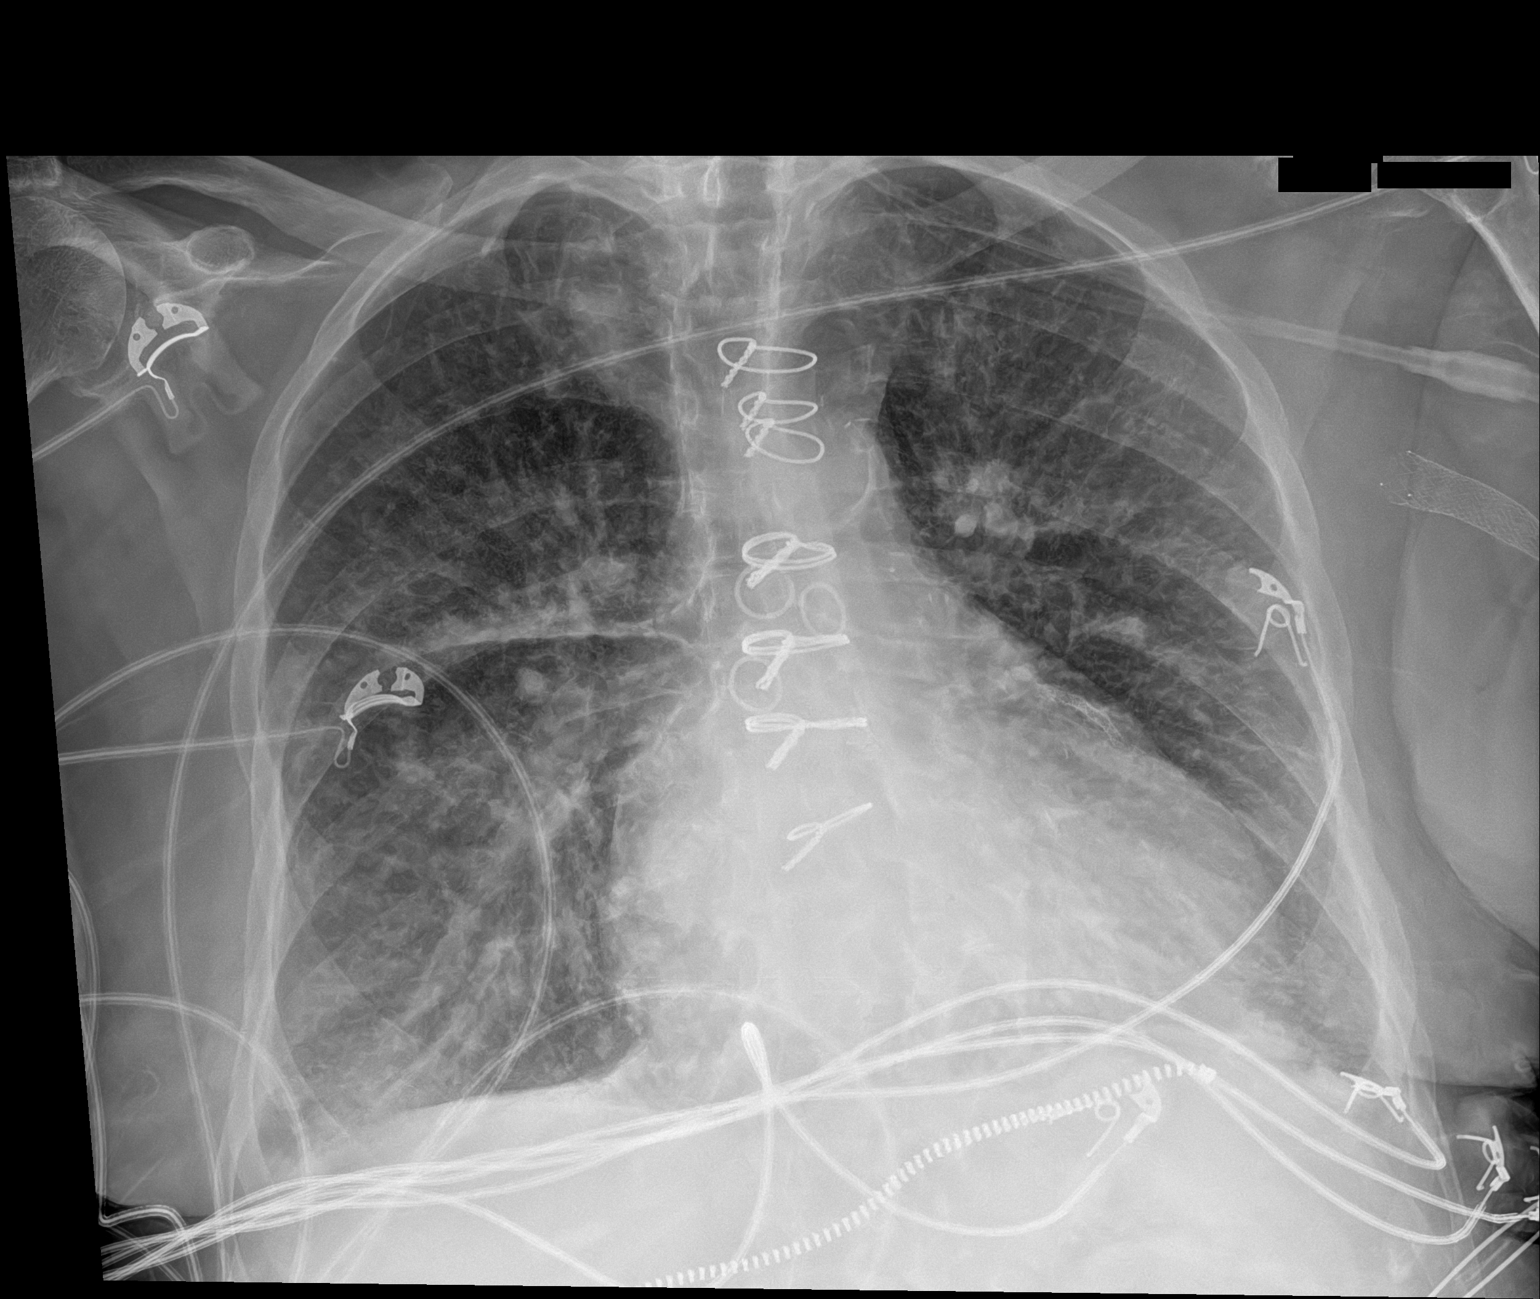

[1 of 1 positions shown; findings below may reference images not displayed]

FINDINGS: Mild enlargement of the cardiopericardial silhouette with pulmonary
venous hypertension and interstitial accentuation. No blunting of
the lateral costophrenic angles. Prior CABG. Atherosclerotic
calcification of the aortic arch. Coronary stents noted. Left
axillary stent noted.
IMPRESSION: 1. Cardiomegaly with pulmonary venous hypertension and interstitial
accentuation probably from interstitial edema.
2. Aortic Atherosclerosis ([3F]-[3F]).

## 2021-02-03 MED ORDER — SODIUM CHLORIDE 0.9 % IV SOLN
200.0000 mg | Freq: Once | INTRAVENOUS | Status: DC
  Filled 2021-02-03: qty 40

## 2021-02-03 MED ORDER — SODIUM CHLORIDE 0.9 % IV SOLN
100.0000 mg | Freq: Every day | INTRAVENOUS | Status: AC
  Administered 2021-02-04 – 2021-02-07 (×4): 100 mg via INTRAVENOUS
  Filled 2021-02-03 (×4): qty 20

## 2021-02-03 MED ORDER — SODIUM CHLORIDE 0.9 % IV SOLN: 100.0000 mg | Freq: Every day | INTRAVENOUS | Status: DC

## 2021-02-03 MED ORDER — REMDESIVIR 100 MG IV SOLR
100.0000 mg | INTRAVENOUS | Status: AC
Start: 2021-02-03 — End: 2021-02-03
  Administered 2021-02-03 (×2): 100 mg via INTRAVENOUS
  Filled 2021-02-03 (×2): qty 20

## 2021-02-03 MED ORDER — ZINC SULFATE 220 (50 ZN) MG PO CAPS
220.0000 mg | ORAL_CAPSULE | Freq: Every day | ORAL | Status: DC
  Administered 2021-02-04 – 2021-02-07 (×3): 220 mg via ORAL
  Filled 2021-02-03 (×4): qty 1

## 2021-02-03 MED ORDER — FAMOTIDINE IN NACL 20-0.9 MG/50ML-% IV SOLN: 20.0000 mg | Freq: Two times a day (BID) | INTRAVENOUS | Status: DC

## 2021-02-03 MED ORDER — TRAMADOL HCL 50 MG PO TABS
50.0000 mg | ORAL_TABLET | Freq: Once | ORAL | Status: AC
  Administered 2021-02-03: 50 mg via ORAL
  Filled 2021-02-03: qty 1

## 2021-02-03 MED ORDER — GUAIFENESIN-DM 100-10 MG/5ML PO SYRP: 10.0000 mL | ORAL_SOLUTION | ORAL | Status: DC | PRN

## 2021-02-03 MED ORDER — SODIUM CHLORIDE 0.9 % IV SOLN: 100.0000 mg | INTRAVENOUS | Status: DC

## 2021-02-03 MED ORDER — SODIUM CHLORIDE 0.9 % IV SOLN
10.0000 mg | INTRAVENOUS | Status: DC
  Filled 2021-02-03 (×3): qty 1

## 2021-02-03 MED ORDER — ASCORBIC ACID 500 MG PO TABS
500.0000 mg | ORAL_TABLET | Freq: Every day | ORAL | Status: DC
  Administered 2021-02-04 – 2021-02-07 (×3): 500 mg via ORAL
  Filled 2021-02-03 (×4): qty 1

## 2021-02-03 MED ORDER — METOPROLOL TARTRATE 25 MG PO TABS
25.0000 mg | ORAL_TABLET | Freq: Two times a day (BID) | ORAL | Status: DC
  Administered 2021-02-03 – 2021-02-07 (×5): 25 mg via ORAL
  Filled 2021-02-03 (×7): qty 1

## 2021-02-03 MED ORDER — ISOSORBIDE MONONITRATE ER 60 MG PO TB24
60.0000 mg | ORAL_TABLET | Freq: Once | ORAL | Status: AC
  Administered 2021-02-03: 60 mg via ORAL
  Filled 2021-02-03: qty 1

## 2021-02-03 MED ORDER — HEPARIN SODIUM (PORCINE) 5000 UNIT/ML IJ SOLN
5000.0000 [IU] | Freq: Three times a day (TID) | INTRAMUSCULAR | Status: DC
  Administered 2021-02-04: 5000 [IU] via SUBCUTANEOUS
  Filled 2021-02-03: qty 1

## 2021-02-03 NOTE — ED Provider Notes (Signed)
[facility] EMERGENCY DEPARTMENT Provider Note   CSN: [id number] Arrival date & time: 02/03/21  1057     History CC:  Blood in stool   Shawna [name] is a 80 y.o. female with history of coronary disease, GI bleed requiring blood transfusions, end-stage renal disease on Monday Wednesday Friday dialysis, presenting to the ED with complaint of blood in toilet.  Patient was initially triaged to vaginal bleeding, but clarifies to me that she has noted blood in her stool.  This been going on for 2 days.  She denies shortness of breath, lightheadedness, weakness.  She denies use of blood thinning medications (and I cannot see any in her med list).  She has been compliant with all of her dialysis sessions except for today, which she missed because she came to the ED.  Last colonoscopy was 03/09/19: - Diverticulosis in the entire examined colon. - Patent end-to-side ileo-colonic anastomosis. - Two small polyps in the proximal ascending colon, removed with a cold snare. Resected and retrieved. - One large polyp in the proximal transverse colon, removed piecemeal using a hot snare. Complete resection. Partial retrieval. Injected.  HPI     Past Medical History:  Diagnosis Date   Acute on chronic respiratory failure with hypoxia ([place]) 10/10/2016   Anxiety    Arthritis    AVM (arteriovenous malformation) of colon    CAD (coronary artery disease)    a. s/p CABG in 2013 b. DES to D1 in 10/2016. c. cath in 07/2018 showing patent grafts with occlusion of D1 at prior stent site and progression of PDA disease --> medical management recommended   Carotid artery disease ([place])    a. 51-76% LICA, 07/6071    Chronic anemia    Chronic bronchitis (HCC)    Chronic diastolic CHF (congestive heart failure) ([place])    a. 02/2012 Echo EF 60-65%, nl wall motion, Gr 1 DD, mod MR   Colon cancer ([place]) 1992   Esophageal stricture    ESRD on hemodialysis ([place])    ESRD due to HTN, started dialysis 2011 and gets HD at  [facility] with Dr Hinda Lenis on MWF schedule.  Access is LUA AVF as of Sept 2014.    GERD (gastroesophageal reflux disease)    High cholesterol 12/2011   History of blood transfusion 07/2011; 12/2011; 01/2012 X 2; 04/2012   History of gout    History of lower GI bleeding    Hypertension    Iron deficiency anemia    Mitral regurgitation    a. Moderate by echo, 02/2012   Myocardial infarction [facility])    NSVT (nonsustained ventricular tachycardia) ([place])    Ovarian cancer ([place]) 1992   PAF (paroxysmal atrial fibrillation) ([place])    Pneumonia ~ 2009   PUD (peptic ulcer disease)    TIA (transient ischemic attack)     Patient Active Problem List   Diagnosis Date Noted   Myositis 12/03/2020   Ataxia 12/02/2020   Diabetes mellitus type 2 in nonobese ([place]) 11/10/2020   Acute pulmonary edema ([place]) 11/10/2020   Generalized weakness 11/09/2020   Dialysis AV fistula malfunction, initial encounter ([place])    Jugular vein occlusion, right (HCC)    Failure of surgically constructed arteriovenous fistula ([place]) 10/03/2020   Myoclonus 08/31/2020   Clotted renal dialysis AV graft, initial encounter ([place])    Hypertensive heart and chronic kidney disease with heart failure and stage 1 through stage 4 chronic kidney disease, or chronic kidney disease ([place])    Hemodialysis-associated hypotension  Acute hypoxemic respiratory failure (HCC) 06/14/2020   Hypertension 06/12/2020   Irritable bowel syndrome 02/25/2020   Adenomatous duodenal polyp 09/10/2019   History of GI bleed 09/10/2019   Angina pectoris ([place]) 06/05/2019   Chest pain 06/03/2019   Small intestinal bacterial overgrowth 05/14/2019   Iron deficiency anemia 04/02/2019   GI bleed 03/08/2019   Gastrointestinal hemorrhage with melena 03/06/2019   Acute respiratory failure with hypoxia (HCC) 12/25/2018   Elevated troponin 12/14/2018   Chest pain at rest 07/13/2018   Hand steal syndrome ([place]) 08/01/2017   Anemia 07/14/2017   Coronary artery disease  06/05/2017   Mesenteric ischemia (HCC)    Diverticulitis    Enteritis    Complication of vascular access for dialysis 03/19/2017   Preoperative clearance 01/25/2017   H/O non-ST elevation myocardial infarction (NSTEMI) 10/24/2016   Fluid overload 79/08/4095   Complication from renal dialysis device 10/10/2016   Non-ST elevation MI (NSTEMI) ([place])    Encounter for fitting and adjustment of vascular catheter    Heme positive stool    Demand ischemia ([place]) 07/27/2016   Hypertensive emergency 07/08/2016   Acute on chronic respiratory failure with hypoxia [facility])    Cardiac arrest [facility])    Palliative care encounter    Goals of care, counseling/discussion    Hypertensive crisis without congestive heart failure 05/09/2016   Flash pulmonary edema ([place]) 04/06/2016   Acute respiratory failure ([place]) 04/06/2016   Hypertensive crisis 01/27/2016   History of colon cancer 01/27/2016   History of ovarian cancer 01/27/2016   Hypertensive urgency 01/27/2016   Paroxysmal atrial fibrillation ([place]) 10/14/2015   Coronary angioplasty status 10/14/2015   Malignant neoplasm of right ovary ([place]) 10/14/2015   Narrow complex tachycardia ([place]) 09/08/2015   SVT (supraventricular tachycardia) ([place]) 09/08/2015   Influenza A 08/30/2015   Acute on chronic diastolic CHF (congestive heart failure) ([place]) 05/04/2015   Unstable angina ([place]) 05/03/2015   DOE (dyspnea on exertion)    Essential hypertension    Pain in joint, lower leg 08/14/2014   Dacryocystitis 05/29/2013   Chronic diastolic CHF (congestive heart failure) ([place]) 03/22/2013   GI bleeding 03/21/2013   Acute blood loss anemia 03/21/2013   Occlusion and stenosis of carotid artery without mention of cerebral infarction 01/24/2013   Hx of CABG 07/05/2012   Carotid artery disease ([place]) 07/05/2012   Mitral regurgitation 06/12/2012   Pneumonia 06/09/2012   Non-STEMI (non-ST elevated myocardial infarction) ([place]) 06/08/2012   Ischemic chest pain ([place])  03/01/2012   AVM (arteriovenous malformation) of small bowel, acquired 01/20/2012   GERD (gastroesophageal reflux disease) 01/09/2012   HLD (hyperlipidemia) 01/05/2012   Atherosclerotic heart disease of native coronary artery without angina pectoris 12/16/2011   Essential hypertension, benign 12/16/2011   ESRD on hemodialysis ([place]) 12/16/2011   Anxiety disorder 05/04/2011   Anemia in chronic kidney disease 04/29/2011   ESRD (end stage renal disease) on dialysis ([place]) 04/29/2011   Gout 04/29/2011   Hypertensive chronic kidney disease with stage 5 chronic kidney disease or end stage renal disease ([place]) 04/29/2011    Past Surgical History:  Procedure Laterality Date   A/V SHUNTOGRAM Left 03/19/2019   Procedure: A/V SHUNTOGRAM;  Surgeon: Katha Cabal, MD;  Location: [place] CV LAB;  Service: Cardiovascular;  Laterality: Left;   ABDOMINAL HYSTERECTOMY  1992   APPENDECTOMY  06/1990   AV FISTULA PLACEMENT  07/2009   left upper arm   AV FISTULA PLACEMENT Right 09/06/2016   Procedure: RIGHT FOREARM ARTERIOVENOUS (AV) GRAFT;  Surgeon: Juanda Crumble  Antony Blackbird, MD;  [facility]: [facility] OR;  Service: Vascular;  Laterality: Right;   AV FISTULA PLACEMENT N/A 02/24/2017   Procedure: INSERTION OF ARTERIOVENOUS (AV) GORE-TEX GRAFT ARM (BRACHIAL AXILLARY);  Surgeon: Katha Cabal, MD;  Location: [facility] ORS;  Service: Vascular;  Laterality: N/A;   [place] Right 09/06/2016   Procedure: REMOVAL OF Right Arm ARTERIOVENOUS GORETEX GRAFT and Vein Patch angioplasty of brachial artery;  Surgeon: Angelia Mould, MD;  Location: [place];  Service: Vascular;  Laterality: Right;   BIOPSY  09/26/2019   Procedure: BIOPSY;  Surgeon: Rogene Houston, MD;  [facility];  Service: Endoscopy;;   COLON RESECTION  1992   COLON SURGERY     COLONOSCOPY N/A 03/09/2019   Procedure: COLONOSCOPY;  Surgeon: Rogene Houston, MD;  [facility];  Service: Endoscopy;  Laterality: N/A;   CORONARY ANGIOPLASTY  WITH STENT PLACEMENT  12/15/11   "2"   CORONARY ANGIOPLASTY WITH STENT PLACEMENT  y/2013   "1; makes total of 3" (05/02/2012)   CORONARY ARTERY BYPASS GRAFT  06/13/2012   Procedure: CORONARY ARTERY BYPASS GRAFTING (CABG);  Surgeon: Grace Isaac, MD;  Location: [place];  Service: Open Heart Surgery;  Laterality: N/A;  cabg x four;  using left internal mammary artery, and left leg greater saphenous vein harvested endoscopically   CORONARY STENT INTERVENTION N/A 10/13/2016   Procedure: Coronary Stent Intervention;  Surgeon: [place] Sine, MD;  Location: [place] CV LAB;  Service: Cardiovascular;  Laterality: N/A;   DIALYSIS/PERMA CATHETER REMOVAL N/A 04/18/2017   Procedure: DIALYSIS/PERMA CATHETER REMOVAL;  Surgeon: Katha Cabal, MD;  Location: [place] CV LAB;  Service: Cardiovascular;  Laterality: N/A;   DILATION AND CURETTAGE OF UTERUS     ESOPHAGOGASTRODUODENOSCOPY  01/20/2012   Procedure: ESOPHAGOGASTRODUODENOSCOPY (EGD);  Surgeon: Ladene Artist, MD,FACG;  Location: [facility] ENDOSCOPY;  Service: Endoscopy;  Laterality: N/A;   ESOPHAGOGASTRODUODENOSCOPY N/A 03/26/2013   Procedure: ESOPHAGOGASTRODUODENOSCOPY (EGD);  Surgeon: Irene Shipper, MD;  Location: [facility] ENDOSCOPY;  Service: Endoscopy;  Laterality: N/A;   ESOPHAGOGASTRODUODENOSCOPY N/A 04/30/2015   Procedure: ESOPHAGOGASTRODUODENOSCOPY (EGD);  Surgeon: Rogene Houston, MD;  [facility];  Service: Endoscopy;  Laterality: N/A;  1pm - moved to 10/20 @ 1:10   ESOPHAGOGASTRODUODENOSCOPY N/A 07/29/2016   Procedure: ESOPHAGOGASTRODUODENOSCOPY (EGD);  Surgeon: Manus Gunning, MD;  Location: [place];  Service: Gastroenterology;  Laterality: N/A;  enteroscopy   ESOPHAGOGASTRODUODENOSCOPY N/A 09/26/2019   Procedure: ESOPHAGOGASTRODUODENOSCOPY (EGD);  Surgeon: Rogene Houston, MD;  [facility];  Service: Endoscopy;  Laterality: N/A;  [place] 03/07/2019   Procedure: GIVENS CAPSULE STUDY;   Surgeon: Rogene Houston, MD;  [facility];  Service: Endoscopy;  Laterality: N/A;  7:30   INSERTION OF DIALYSIS CATHETER N/A 10/05/2020   Procedure: ABORTED TUNNELED DIALYSIS CATHETER PLACEMENT RIGHT INTERNAL JUGULAR VEIN ;  Surgeon: Virl Cagey, MD;  Location: [facility];  Service: General;  Laterality: N/A;   INTRAOPERATIVE TRANSESOPHAGEAL ECHOCARDIOGRAM  06/13/2012   Procedure: INTRAOPERATIVE TRANSESOPHAGEAL ECHOCARDIOGRAM;  Surgeon: Grace Isaac, MD;  Location: [place];  Service: Open Heart Surgery;  Laterality: N/A;   IR DIALY SHUNT INTRO NEEDLE/INTRACATH INITIAL W/IMG LEFT Left 10/06/2020   IR FLUORO GUIDE CV LINE RIGHT  06/17/2020   IR GENERIC HISTORICAL  07/26/2016   IR FLUORO GUIDE CV LINE RIGHT 07/26/2016 Greggory Keen, MD [facility]   IR GENERIC HISTORICAL  07/26/2016   IR US  GUIDE VASC ACCESS RIGHT 07/26/2016 Greggory Keen, MD [facility]   IR GENERIC HISTORICAL  08/02/2016   IR US GUIDE VASC ACCESS RIGHT 08/02/2016 Greggory Keen, MD [facility]   IR GENERIC HISTORICAL  08/02/2016   IR FLUORO GUIDE CV LINE RIGHT 08/02/2016 Greggory Keen, MD [facility]   IR RADIOLOGY PERIPHERAL GUIDED IV START  03/28/2017   IR REMOVAL TUN CV CATH W/O FL  08/11/2020   IR THROMBECTOMY AV FISTULA W/THROMBOLYSIS INC/SHUNT/IMG LEFT Left 06/17/2020   IR US GUIDE VASC ACCESS LEFT  06/17/2020   IR US GUIDE VASC ACCESS RIGHT  03/28/2017   IR US GUIDE VASC ACCESS RIGHT  06/17/2020   LEFT HEART CATH AND CORONARY ANGIOGRAPHY N/A 09/20/2016   Procedure: Left Heart Cath and Coronary Angiography;  Surgeon: [name], MD;  Location: [place] CV LAB;  Service: Cardiovascular;  Laterality: N/A;   LEFT HEART CATH AND CORS/GRAFTS ANGIOGRAPHY N/A 10/13/2016   Procedure: Left Heart Cath and Cors/Grafts Angiography;  Surgeon: [place] Sine, MD;  Location: [place] CV LAB;  Service: Cardiovascular;  Laterality: N/A;   LEFT HEART CATH AND CORS/GRAFTS ANGIOGRAPHY N/A 07/13/2018   Procedure: LEFT HEART  CATH AND CORS/GRAFTS ANGIOGRAPHY;  Surgeon: Martinique, Peter M, MD;  Location: [place] CV LAB;  Service: Cardiovascular;  Laterality: N/A;   LEFT HEART CATHETERIZATION WITH CORONARY ANGIOGRAM N/A 12/15/2011   Procedure: LEFT HEART CATHETERIZATION WITH CORONARY ANGIOGRAM;  Surgeon: Burnell Blanks, MD;  Location: [facility] CATH LAB;  Service: Cardiovascular;  Laterality: N/A;   LEFT HEART CATHETERIZATION WITH CORONARY ANGIOGRAM N/A 01/10/2012   Procedure: LEFT HEART CATHETERIZATION WITH CORONARY ANGIOGRAM;  Surgeon: Peter M Martinique, MD;  Location: [facility] CATH LAB;  Service: Cardiovascular;  Laterality: N/A;   LEFT HEART CATHETERIZATION WITH CORONARY ANGIOGRAM N/A 06/08/2012   Procedure: LEFT HEART CATHETERIZATION WITH CORONARY ANGIOGRAM;  Surgeon: Burnell Blanks, MD;  Location: [facility] CATH LAB;  Service: Cardiovascular;  Laterality: N/A;   LEFT HEART CATHETERIZATION WITH CORONARY/GRAFT ANGIOGRAM N/A 12/10/2013   Procedure: LEFT HEART CATHETERIZATION WITH Beatrix Fetters;  Surgeon: [name], MD;  Location: [facility] CATH LAB;  Service: Cardiovascular;  Laterality: N/A;   OVARY SURGERY     ovarian cancer   POLYPECTOMY  03/09/2019   Procedure: POLYPECTOMY;  Surgeon: Rogene Houston, MD;  [facility];  Service: Endoscopy;;  cecal    POLYPECTOMY N/A 09/26/2019   Procedure: DUODENAL POLYPECTOMY;  Surgeon: Rogene Houston, MD;  [facility];  Service: Endoscopy;  Laterality: N/A;   REVISION OF ARTERIOVENOUS GORETEX GRAFT N/A 02/24/2017   Procedure: REVISION OF ARTERIOVENOUS GORETEX GRAFT (RESECTION);  Surgeon: Katha Cabal, MD;  Location: [facility] ORS;  Service: Vascular;  Laterality: N/A;   REVISON OF ARTERIOVENOUS FISTULA Left 06/19/2020   Procedure: REVISION OF LEFT UPPER ARM AV GRAFT WITH INTERPOSITION JUMP GRAFT USING 6MM GORE LIMB;  Surgeon: Marty Heck, MD;  Location: [place];  Service: Vascular;  Laterality: Left;   SHUNTOGRAM N/A 10/15/2013   Procedure:  Fistulogram;  Surgeon: Serafina Mitchell, MD;  Location[facility] CATH LAB;  Service: Cardiovascular;  Laterality: N/A;   THROMBECTOMY / ARTERIOVENOUS GRAFT REVISION  2011   left upper arm   TUBAL LIGATION  1980's   UPPER EXTREMITY ANGIOGRAPHY Bilateral 12/06/2016   Procedure: Upper Extremity Angiography;  Surgeon: Katha Cabal, MD;  Location: [place] CV LAB;  Service: Cardiovascular;  Laterality: Bilateral;   UPPER EXTREMITY INTERVENTION Left 06/06/2017   Procedure: UPPER EXTREMITY INTERVENTION;  Surgeon: Katha Cabal, MD;  Location: [place] CV LAB;  Service: Cardiovascular;  Laterality: Left;     OB History     Gravida  2   Para  2   Term      Preterm  2   AB      Living  2      SAB      IAB      Ectopic      Multiple      Live Births              Family History  Problem Relation Age of Onset   Heart disease Mother        Heart Disease before age 60   Hyperlipidemia Mother    Hypertension Mother    Diabetes Mother    Heart attack Mother    Heart disease Father        Heart Disease before age 57   Hyperlipidemia Father    Hypertension Father    Diabetes Father    Diabetes Sister    Hypertension Sister    Diabetes Brother    Hyperlipidemia Brother    Heart attack Brother    Hypertension Sister    Heart attack Brother    Colon cancer Child 58   Other Other        noncontributory for early CAD   Esophageal cancer Neg Hx    Liver disease Neg Hx    Kidney disease Neg Hx    Colon polyps Neg Hx     Social History   Tobacco Use   Smoking status: Never   Smokeless tobacco: Never  Vaping Use   Vaping Use: Never used  Substance Use Topics   Alcohol use: No    Alcohol/week: 0.0 standard drinks   Drug use: No    Home Medications Prior to Admission medications   Medication Sig Start Date End Date Taking? Authorizing Provider  aspirin EC 81 MG tablet Take 81 mg by mouth daily.  09/27/19   Rehman, Mechele Dawley, MD  atorvastatin  (LIPITOR) 10 MG tablet Take 10 mg by mouth daily.    [provider]  cyclobenzaprine (FLEXERIL) 5 MG tablet Take 5 mg by mouth 3 (three) times daily as needed for muscle spasms. 11/30/20   [provider]  fluticasone (FLONASE) 50 MCG/ACT nasal spray Place 1 spray into both nostrils at bedtime as needed for allergies.     [provider]  hydrOXYzine (VISTARIL) 100 MG capsule Take 100 mg by mouth 2 (two) times daily as needed for anxiety or itching. 11/23/20   [provider]  isosorbide mononitrate (IMDUR) 60 MG 24 hr tablet Take 1 tablet (60 mg total) by mouth in the morning and at bedtime. 10/20/20 01/18/21  Strader, Fransisco Hertz, PA-C  levothyroxine (SYNTHROID) 25 MCG tablet Take 1 tablet (25 mcg total) by mouth daily. 02/02/21   Brita Romp, NP  lidocaine-prilocaine (EMLA) cream Apply 1 application topically every Monday, Wednesday, and Friday. Prior to dialysis    [provider]  loperamide (IMODIUM A-D) 2 MG tablet Take 2 tablets (4 mg total) by mouth daily as needed for diarrhea or loose stools. 12/17/20   Thurnell Lose, MD  loratadine (CLARITIN) 10 MG tablet Take 1 tablet (10 mg total) by mouth daily as needed for allergies. 07/14/18   Hosie Poisson, MD  metoprolol tartrate (LOPRESSOR) 25 MG tablet Take 1 tablet (25 mg total) by mouth 2 (two) times daily.  12/17/20   Thurnell Lose, MD  midodrine (PROAMATINE) 5 MG tablet Take Monday, Wednesday, Friday morning BEFORE dialysis 12/22/20   Arnoldo Lenis, MD  multivitamin (RENA-VIT) TABS tablet Take 1 tablet by mouth daily.    [provider]  nitroGLYCERIN (NITROSTAT) 0.4 MG SL tablet Place 1 tablet (0.4 mg total) under the tongue every 5 (five) minutes x 3 doses as needed for chest pain (if no relief after 2nd dose, proceed to the ED for an evaluation or call 911). 09/08/20   Verta Ellen., NP  omeprazole (PRILOSEC) 20 MG capsule Take 1 capsule (20 mg total) by mouth daily. 12/01/20    Rogene Houston, MD  sevelamer carbonate (RENVELA) 800 MG tablet Take 2 tablets (1,600 mg total) by mouth 3 (three) times daily with meals. 06/20/20   Meccariello, Bernita Raisin, DO  triamcinolone (KENALOG) 0.025 % ointment Apply 1 application topically 2 (two) times daily as needed (itching). 11/20/20   [provider]  triamcinolone cream (KENALOG) 0.1 % Apply 1 application topically 2 (two) times daily as needed (itching). 11/19/20   [provider]    Allergies    Aspirin, Penicillins, Amlodipine, Bactrim [sulfamethoxazole-trimethoprim], Contrast media [iodinated diagnostic agents], Iron, Nitrofurantoin, Tylenol [acetaminophen], Gabapentin, Hydralazine, Levofloxacin, No known allergies, Ranexa [ranolazine], Dexilant [dexlansoprazole], Levaquin [levofloxacin in d5w], Morphine and related, Plavix [clopidogrel bisulfate], Protonix [pantoprazole sodium], and Venofer [ferric oxide]  Review of Systems   Review of Systems  Constitutional:  Negative for chills and fever.  HENT:  Negative for ear pain and sore throat.   Eyes:  Negative for pain and visual disturbance.  Respiratory:  Negative for cough and shortness of breath.   Cardiovascular:  Negative for chest pain and palpitations.  Gastrointestinal:  Positive for blood in stool. Negative for abdominal pain, nausea and vomiting.  Musculoskeletal:  Negative for arthralgias and back pain.  Skin:  Negative for color change and rash.  Neurological:  Negative for seizures and syncope.  All other systems reviewed and are negative.  Physical Exam Updated Vital Signs BP (!) 200/68   Pulse 83   Temp 99.8 F (37.7 C) (Oral)   Resp 20   SpO2 96%   Physical Exam Constitutional:      General: She is not in acute distress. HENT:     Head: Normocephalic and atraumatic.  Eyes:     Conjunctiva/sclera: Conjunctivae normal.     Pupils: Pupils are equal, round, and reactive to light.  Cardiovascular:     Rate and Rhythm: Normal rate  and regular rhythm.  Pulmonary:     Effort: Pulmonary effort is normal. No respiratory distress.  Abdominal:     General: There is no distension.     Tenderness: There is no abdominal tenderness.  Genitourinary:    Comments: Melena positive on rectal exam Musculoskeletal:     Comments: Left arm fistula with thrill, bruit  Skin:    General: Skin is warm and dry.  Neurological:     General: No focal deficit present.     Mental Status: She is alert. Mental status is at baseline.  Psychiatric:        Mood and Affect: Mood normal.        Behavior: Behavior normal.    ED Results / Procedures / Treatments   Labs (all labs ordered are listed, but only abnormal results are displayed) Labs Reviewed  POC OCCULT BLOOD, ED - Abnormal; Notable for the following components:      Result  Value   Fecal Occult Bld POSITIVE (*)    All other components within normal limits  BASIC METABOLIC PANEL  CBC WITH DIFFERENTIAL/PLATELET  PROTIME-INR  HEPATIC FUNCTION PANEL  TYPE AND SCREEN    EKG None  Radiology No results found.  Procedures Procedures   Medications Ordered in ED Medications - No data to display  ED Course  I have reviewed the triage vital signs and the nursing notes.  Pertinent labs & imaging results that were available during my care of the patient were reviewed by me and considered in my medical decision making (see chart for details).  Pt here with concern for blood in stool Melena on exam Likely GI bleed which may be 2/2 diverticulosis noted on prior exam She's not on A/C  Hemodynamically stable (some hypertension here but no evidence of HTN emergency) NO emergent indication for dialysis a this time.  She only missed 1 session this week which was today.  K 5.0. Labs pending at the time of signout to EDP Dr Eulis Foster at [facility]  The patient was verbally consented for blood transfusion by myself if this were to become necessary.  Her husband was present in the  room.  Clinical Course as of 02/03/21 1811  Wed Feb 03, 2021  1431 USIV placed in right proximal arm - labs sent now [MT]    Clinical Course User Index [MT] Chantelle Verdi, Carola Rhine, MD    Final Clinical Impression(s) / ED Diagnoses Final diagnoses:  None    Rx / DC Orders ED Discharge Orders     None        Wyvonnia Dusky, MD 02/03/21 [phone]

## 2021-02-03 NOTE — ED Notes (Signed)
Date and time results received: 02/03/21 2011  Test: COVID Critical Value: Postive  Name of Provider Notified: Eulis Foster

## 2021-02-03 NOTE — ED Provider Notes (Signed)
3:15 PM-checkout from [place]; to evaluate patient after return of labs.  6:30 PM-CBC indicative of microcytic anemia, hemoglobin near baseline.  Vital signs rechecked,      Clinical Course as of 02/04/21 1143  Wed Feb 03, 2021  1431 USIV placed in right proximal arm - labs sent now [MT]  1844 Pt came today for CP anterior and posterior. There has been no SOB. She saw blood in stool twice today. She had trouble sleeping last night due to the CP. The CP has resolved now.  [EW]  4196 BP high now. She has not had her meds today. She did not have dialysis today. [EW]  1851 Now she states that she is SOB [EW]  1920 Patient placed on room air oxygen and within 20 minutes desaturated to 81%.  She is placed back on nasal cannula oxygen, with improvement of her oxygen saturation to normal. [EW]  2017 Incidental positive COVID test.  Suspect hypoxia more from pulm edema than COVID. [EW]    Clinical Course User Index [EW] Daleen Bo, MD [MT] Wyvonnia Dusky, MD    Patient presents for various symptoms, skipped dialysis today and was found to be fluid overloaded, with normal potassium.  Oxygenation low 81% on room air.  Chest x-ray indicates fluid overload.  She does not use oxygen at home.  She will require hospitalization for oxygen treatment, and dialysis.  Patient with rectal bleeding, likely diverticular without significant lowering of hemoglobin on initial testing.  This should be followed expectantly with serial CBGs.  Consider abdominal CT if she develops abdominal pain.  7:20 PM-requested callback from nephrology to discuss appropriate time for dialysis.  Case discussed with Dr. Joelyn Oms at 7:40 PM.  He plans on dialyzing her tomorrow since the patient can be managed on nasal cannula oxygen  7:42 PM-Consult complete with hospitalist. Patient case explained and discussed.  He agrees to admit patient for further evaluation and treatment. Call ended at 7:55 PM   Daleen Bo,  MD 02/04/21 1144

## 2021-02-03 NOTE — ED Notes (Addendum)
Patient has been stuck X5 times by 3 RNS. Unable to obtain blood sample or IV access. Dr. Langston Masker made aware and states he will follow up with patient for IV access.

## 2021-02-03 NOTE — H&P (Addendum)
History and Physical  Shawna [name]:[id number] DOB: Nov 18, 1940 DOA: 02/03/2021  Referring physician: Daleen Bo, MD  PCP: [facility]  Patient coming from: Home  Chief Complaint: Blood in stool  HPI: Shawna [name] is a 80 y.o. female with medical history significant for chronic diastolic heart failure, ESRD on HD (MWF) at [facility] in [place], hypertension, hyperlipidemia, CAD s/p CABG, hypothyroidism, GERD who presents to the emergency department accompanied by her husband due to 2-day onset of blood in stool.  She also complained of chest and back soreness which resulted in her coming to the ED this morning.  She denies use of blood thinners, NSAIDs or Goody powders.  Patient states that she has been compliant with her dialysis except today due to coming to the ED. Last colonoscopy was on 03/09/2019 during which diverticulosis was noted in the entire examined colon and patent into side ileocolonic anastomosis were noted. Two small polyps in the proximal ascending colon, removed with a cold snare. Resected and retrieved. One large polyp in the proximal transverse colon, removed piecemeal using a hot snare. Complete resection. Partial retrieval. Injected per ED medical record. She denies fever, chills, headache, nausea, vomiting.  Patient states that she got the 2 COVID vaccines and booster.  ED Course:  In the emergency department, she was tachypneic, BP was 208/64, O2 sat dropped to 89% on room air but is improved with supplemental oxygen at 2 LPM via [place].  Work-up in the ED showed leukocytosis, H/H 8.9/20.6, MCV 120.2, BUN/creatinine 49/8.17 with eGFR of 5.  Albumin 3.2, FOBT positive, influenza A, B negative.  SARS coronavirus 2 was positive. Chest x-ray showed Cardiomegaly with pulmonary venous hypertension and interstitial accentuation probably from interstitial edema. Patient was treated with Imdur and tramadol.  Nephrology was consulted and patient will be dialyzed tomorrow per  ED physician's medical record.  Hospitalist was asked to admit.  For further evaluation and management.  Review of Systems: Constitutional: Negative for chills and fever.  HENT: Negative for ear pain and sore throat.   Eyes: Negative for pain and visual disturbance.  Respiratory: Negative for cough, chest tightness and shortness of breath.   Cardiovascular: Negative for chest pain and palpitations.  Gastrointestinal: Positive for blood in stool.  Negative for abdominal pain and vomiting.  Endocrine: Negative for polyphagia and polyuria.  Genitourinary: Negative for decreased urine volume, dysuria, enuresis Musculoskeletal: Positive for improved chest and back soreness. Skin: Negative for color change and rash.  Allergic/Immunologic: Negative for immunocompromised state.  Neurological: Negative for tremors, syncope, speech difficulty, weakness, light-headedness and headaches.  Hematological: Does not bruise/bleed easily.  All other systems reviewed and are negative   Past Medical History:  Diagnosis Date   Acute on chronic respiratory failure with hypoxia ([place]) 10/10/2016   Anxiety    Arthritis    AVM (arteriovenous malformation) of colon    CAD (coronary artery disease)    a. s/p CABG in 2013 b. DES to D1 in 10/2016. c. cath in 07/2018 showing patent grafts with occlusion of D1 at prior stent site and progression of PDA disease --> medical management recommended   Carotid artery disease ([place])    a. 15-40% LICA, 0/8676    Chronic anemia    Chronic bronchitis (HCC)    Chronic diastolic CHF (congestive heart failure) ([place])    a. 02/2012 Echo EF 60-65%, nl wall motion, Gr 1 DD, mod MR   Colon cancer ([place]) 1992   Esophageal stricture    ESRD on  hemodialysis ([place])    ESRD due to HTN, started dialysis 2011 and gets HD at [facility] with Dr Hinda Lenis on MWF schedule.  Access is LUA AVF as of Sept 2014.    GERD (gastroesophageal reflux disease)    High cholesterol 12/2011   History of  blood transfusion 07/2011; 12/2011; 01/2012 X 2; 04/2012   History of gout    History of lower GI bleeding    Hypertension    Iron deficiency anemia    Mitral regurgitation    a. Moderate by echo, 02/2012   Myocardial infarction [facility])    NSVT (nonsustained ventricular tachycardia) ([place])    Ovarian cancer ([place]) 1992   PAF (paroxysmal atrial fibrillation) ([place])    Pneumonia ~ 2009   PUD (peptic ulcer disease)    TIA (transient ischemic attack)    Past Surgical History:  Procedure Laterality Date   A/V SHUNTOGRAM Left 03/19/2019   Procedure: A/V SHUNTOGRAM;  Surgeon: Katha Cabal, MD;  Location: [place] [facility];  Service: Cardiovascular;  Laterality: Left;   ABDOMINAL HYSTERECTOMY  1992   APPENDECTOMY  06/1990   AV FISTULA PLACEMENT  07/2009   left upper arm   AV FISTULA PLACEMENT Right 09/06/2016   Procedure: RIGHT FOREARM ARTERIOVENOUS (AV) GRAFT;  Surgeon: Elam Dutch, MD;  Location: [place];  Service: Vascular;  Laterality: Right;   AV FISTULA PLACEMENT N/A 02/24/2017   Procedure: INSERTION OF ARTERIOVENOUS (AV) GORE-TEX GRAFT ARM (BRACHIAL AXILLARY);  Surgeon: Katha Cabal, MD;  Location: [facility];  Service: Vascular;  Laterality: N/A;   [place] Right 09/06/2016   Procedure: REMOVAL OF Right Arm ARTERIOVENOUS GORETEX GRAFT and Vein Patch angioplasty of brachial artery;  Surgeon: Angelia Mould, MD;  Location: [place];  Service: Vascular;  Laterality: Right;   BIOPSY  09/26/2019   Procedure: BIOPSY;  Surgeon: Rogene Houston, MD;  [facility];  Service: Endoscopy;;   COLON RESECTION  1992   COLON SURGERY     COLONOSCOPY N/A 03/09/2019   Procedure: COLONOSCOPY;  Surgeon: Rogene Houston, MD;  [facility];  Service: Endoscopy;  Laterality: N/A;   CORONARY ANGIOPLASTY WITH STENT PLACEMENT  12/15/11   "2"   CORONARY ANGIOPLASTY WITH STENT PLACEMENT  y/2013   "1; makes total of 3" (05/02/2012)   CORONARY ARTERY BYPASS GRAFT  06/13/2012    Procedure: CORONARY ARTERY BYPASS GRAFTING (CABG);  Surgeon: Grace Isaac, MD;  Location: [place];  Service: Open Heart Surgery;  Laterality: N/A;  cabg x four;  using left internal mammary artery, and left leg greater saphenous vein harvested endoscopically   CORONARY STENT INTERVENTION N/A 10/13/2016   Procedure: Coronary Stent Intervention;  Surgeon: Troy Sine, MD;  Location: [place] [facility];  Service: Cardiovascular;  Laterality: N/A;   DIALYSIS/PERMA CATHETER REMOVAL N/A 04/18/2017   Procedure: DIALYSIS/PERMA CATHETER REMOVAL;  Surgeon: Katha Cabal, MD;  Location: [place] [facility];  Service: Cardiovascular;  Laterality: N/A;   DILATION AND CURETTAGE OF UTERUS     ESOPHAGOGASTRODUODENOSCOPY  01/20/2012   Procedure: ESOPHAGOGASTRODUODENOSCOPY (EGD);  Surgeon: Ladene Artist, MD,FACG;  Location: [facility] ENDOSCOPY;  Service: Endoscopy;  Laterality: N/A;   ESOPHAGOGASTRODUODENOSCOPY N/A 03/26/2013   Procedure: ESOPHAGOGASTRODUODENOSCOPY (EGD);  Surgeon: Irene Shipper, MD;  [facility];  Service: Endoscopy;  Laterality: N/A;   ESOPHAGOGASTRODUODENOSCOPY N/A 04/30/2015   Procedure: ESOPHAGOGASTRODUODENOSCOPY (EGD);  Surgeon: Rogene Houston, MD;  [facility];  Service: Endoscopy;  Laterality:  N/A;  1pm - moved to 10/20 @ 1:10   ESOPHAGOGASTRODUODENOSCOPY N/A 07/29/2016   Procedure: ESOPHAGOGASTRODUODENOSCOPY (EGD);  Surgeon: Manus Gunning, MD;  Location: [place];  Service: Gastroenterology;  Laterality: N/A;  enteroscopy   ESOPHAGOGASTRODUODENOSCOPY N/A 09/26/2019   Procedure: ESOPHAGOGASTRODUODENOSCOPY (EGD);  Surgeon: Rogene Houston, MD;  [facility];  Service: Endoscopy;  Laterality: N/A;  [place] 03/07/2019   Procedure: GIVENS CAPSULE STUDY;  Surgeon: Rogene Houston, MD;  [facility];  Service: Endoscopy;  Laterality: N/A;  7:30   INSERTION OF DIALYSIS CATHETER N/A 10/05/2020   Procedure: ABORTED  TUNNELED DIALYSIS CATHETER PLACEMENT RIGHT INTERNAL JUGULAR VEIN ;  Surgeon: Virl Cagey, MD;  [facility];  Service: General;  Laterality: N/A;   INTRAOPERATIVE TRANSESOPHAGEAL ECHOCARDIOGRAM  06/13/2012   Procedure: INTRAOPERATIVE TRANSESOPHAGEAL ECHOCARDIOGRAM;  Surgeon: Grace Isaac, MD;  Location: [place];  Service: Open Heart Surgery;  Laterality: N/A;   IR DIALY SHUNT INTRO NEEDLE/INTRACATH INITIAL W/IMG LEFT Left 10/06/2020   IR FLUORO GUIDE CV LINE RIGHT  06/17/2020   IR GENERIC HISTORICAL  07/26/2016   IR FLUORO GUIDE CV LINE RIGHT 07/26/2016 Greggory Keen, MD [facility]   IR GENERIC HISTORICAL  07/26/2016   IR US GUIDE VASC ACCESS RIGHT 07/26/2016 Greggory Keen, MD [facility]   IR GENERIC HISTORICAL  08/02/2016   IR US GUIDE VASC ACCESS RIGHT 08/02/2016 Greggory Keen, MD [facility]   IR GENERIC HISTORICAL  08/02/2016   IR FLUORO GUIDE CV LINE RIGHT 08/02/2016 Greggory Keen, MD [facility]   IR RADIOLOGY PERIPHERAL GUIDED IV START  03/28/2017   IR REMOVAL TUN CV CATH W/O FL  08/11/2020   IR THROMBECTOMY AV FISTULA W/THROMBOLYSIS INC/SHUNT/IMG LEFT Left 06/17/2020   IR US GUIDE VASC ACCESS LEFT  06/17/2020   IR US GUIDE VASC ACCESS RIGHT  03/28/2017   IR US GUIDE VASC ACCESS RIGHT  06/17/2020   LEFT HEART CATH AND CORONARY ANGIOGRAPHY N/A 09/20/2016   Procedure: Left Heart Cath and Coronary Angiography;  Surgeon: [name], MD;  Location: [place] [facility];  Service: Cardiovascular;  Laterality: N/A;   LEFT HEART CATH AND CORS/GRAFTS ANGIOGRAPHY N/A 10/13/2016   Procedure: Left Heart Cath and Cors/Grafts Angiography;  Surgeon: Troy Sine, MD;  Location: [place] [facility];  Service: Cardiovascular;  Laterality: N/A;   LEFT HEART CATH AND CORS/GRAFTS ANGIOGRAPHY N/A 07/13/2018   Procedure: LEFT HEART CATH AND CORS/GRAFTS ANGIOGRAPHY;  Surgeon: Martinique, Peter M, MD;  Location: [place] [facility];  Service: Cardiovascular;  Laterality: N/A;   LEFT HEART CATHETERIZATION WITH  CORONARY ANGIOGRAM N/A 12/15/2011   Procedure: LEFT HEART CATHETERIZATION WITH CORONARY ANGIOGRAM;  Surgeon: Burnell Blanks, MD;  Location: [facility] CATH LAB;  Service: Cardiovascular;  Laterality: N/A;   LEFT HEART CATHETERIZATION WITH CORONARY ANGIOGRAM N/A 01/10/2012   Procedure: LEFT HEART CATHETERIZATION WITH CORONARY ANGIOGRAM;  Surgeon: Peter M Martinique, MD;  Location: [facility] CATH LAB;  Service: Cardiovascular;  Laterality: N/A;   LEFT HEART CATHETERIZATION WITH CORONARY ANGIOGRAM N/A 06/08/2012   Procedure: LEFT HEART CATHETERIZATION WITH CORONARY ANGIOGRAM;  Surgeon: Burnell Blanks, MD;  Location: [facility] CATH LAB;  Service: Cardiovascular;  Laterality: N/A;   LEFT HEART CATHETERIZATION WITH CORONARY/GRAFT ANGIOGRAM N/A 12/10/2013   Procedure: LEFT HEART CATHETERIZATION WITH Beatrix Fetters;  Surgeon: [name], MD;  Location: [facility] CATH LAB;  Service: Cardiovascular;  Laterality: N/A;   OVARY SURGERY     ovarian  cancer   POLYPECTOMY  03/09/2019   Procedure: POLYPECTOMY;  Surgeon: Rogene Houston, MD;  [facility];  Service: Endoscopy;;  cecal    POLYPECTOMY N/A 09/26/2019   Procedure: DUODENAL POLYPECTOMY;  Surgeon: Rogene Houston, MD;  [facility];  Service: Endoscopy;  Laterality: N/A;   REVISION OF ARTERIOVENOUS GORETEX GRAFT N/A 02/24/2017   Procedure: REVISION OF ARTERIOVENOUS GORETEX GRAFT (RESECTION);  Surgeon: Katha Cabal, MD;  Location: [facility] ORS;  Service: Vascular;  Laterality: N/A;   REVISON OF ARTERIOVENOUS FISTULA Left 06/19/2020   Procedure: REVISION OF LEFT UPPER ARM AV GRAFT WITH INTERPOSITION JUMP GRAFT USING 6MM GORE LIMB;  Surgeon: Marty Heck, MD;  Location: [place];  Service: Vascular;  Laterality: Left;   SHUNTOGRAM N/A 10/15/2013   Procedure: Fistulogram;  Surgeon: Serafina Mitchell, MD;  Location: [facility] CATH LAB;  Service: Cardiovascular;  Laterality: N/A;   THROMBECTOMY / ARTERIOVENOUS GRAFT REVISION  2011   left upper  arm   TUBAL LIGATION  1980's   UPPER EXTREMITY ANGIOGRAPHY Bilateral 12/06/2016   Procedure: Upper Extremity Angiography;  Surgeon: Katha Cabal, MD;  Location: [place] [facility];  Service: Cardiovascular;  Laterality: Bilateral;   UPPER EXTREMITY INTERVENTION Left 06/06/2017   Procedure: UPPER EXTREMITY INTERVENTION;  Surgeon: Katha Cabal, MD;  Location: [place] [facility];  Service: Cardiovascular;  Laterality: Left;    Social History:  reports that she has never smoked. She has never used smokeless tobacco. She reports that she does not drink alcohol and does not use drugs.   Allergies  Allergen Reactions   Aspirin Other (See Comments)    High Doses Mess up her stomach; "makes my bowels have blood in them". Takes 81 mg EC Aspirin    Penicillins Other (See Comments)    SYNCOPE? , "makes me real weak when I take it; like I'll pass out"  Has patient had a PCN reaction causing immediate rash, facial/tongue/throat swelling, SOB or lightheadedness with hypotension: Yes Has patient had a PCN reaction causing severe rash involving mucus membranes or skin necrosis: no Has patient had a PCN reaction that required hospitalization no Has patient had a PCN reaction occurring within the last 10 years: no If all of the above   Amlodipine Swelling   Bactrim [Sulfamethoxazole-Trimethoprim] Rash   Contrast Media [Iodinated Diagnostic Agents] Itching   Iron Itching and Other (See Comments)    "they gave me iron in dialysis; had to give me Benadryl cause I had to have the iron" (05/02/2012)   Nitrofurantoin Hives   Tylenol [Acetaminophen] Itching and Other (See Comments)    Makes her feet on fire per pt   Gabapentin Other (See Comments)    Unknown reaction   Hydralazine Itching    Has tolerated while inpatient   Levofloxacin    No Known Allergies    Ranexa [Ranolazine] Other (See Comments)    Myoclonus-hospitalized    Dexilant [Dexlansoprazole] Other (See Comments)    Upset  stomach   Levaquin [Levofloxacin In D5w] Rash   Morphine And Related Itching and Other (See Comments)    Itching in feet   Plavix [Clopidogrel Bisulfate] Rash   Protonix [Pantoprazole Sodium] Rash   Venofer [Ferric Oxide] Itching and Other (See Comments)    Patient reports using Benadryl prior to doses as [facility]    Family History  Problem Relation Age of Onset   Heart disease Mother        Heart Disease  before age 45   Hyperlipidemia Mother    Hypertension Mother    Diabetes Mother    Heart attack Mother    Heart disease Father        Heart Disease before age 68   Hyperlipidemia Father    Hypertension Father    Diabetes Father    Diabetes Sister    Hypertension Sister    Diabetes Brother    Hyperlipidemia Brother    Heart attack Brother    Hypertension Sister    Heart attack Brother    Colon cancer Child 38   Other Other        noncontributory for early CAD   Esophageal cancer Neg Hx    Liver disease Neg Hx    Kidney disease Neg Hx    Colon polyps Neg Hx      Prior to Admission medications   Medication Sig Start Date End Date Taking? Authorizing Provider  atorvastatin (LIPITOR) 10 MG tablet Take 10 mg by mouth daily.   Yes [provider]  cyclobenzaprine (FLEXERIL) 5 MG tablet Take 5 mg by mouth 3 (three) times daily as needed for muscle spasms. 11/30/20  Yes [provider]  hydrOXYzine (VISTARIL) 100 MG capsule Take 100 mg by mouth 2 (two) times daily as needed for anxiety or itching. 11/23/20  Yes [provider]  levothyroxine (SYNTHROID) 25 MCG tablet Take 1 tablet (25 mcg total) by mouth daily. 02/02/21  Yes Brita Romp, NP  metoprolol tartrate (LOPRESSOR) 25 MG tablet Take 1 tablet (25 mg total) by mouth 2 (two) times daily. 12/17/20  Yes Thurnell Lose, MD  midodrine (PROAMATINE) 5 MG tablet Take Monday, Wednesday, Friday morning BEFORE dialysis Patient taking differently: Take 5 mg by mouth See admin instructions. Take  Monday, Wednesday, Friday morning BEFORE dialysis 12/22/20  Yes Branch, Alphonse Guild, MD  nitroGLYCERIN (NITROSTAT) 0.4 MG SL tablet Place 1 tablet (0.4 mg total) under the tongue every 5 (five) minutes x 3 doses as needed for chest pain (if no relief after 2nd dose, proceed to the ED for an evaluation or call 911). 09/08/20  Yes Verta Ellen., NP  omeprazole (PRILOSEC) 20 MG capsule Take 1 capsule (20 mg total) by mouth daily. 12/01/20  Yes Rehman, Mechele Dawley, MD  sevelamer carbonate (RENVELA) 800 MG tablet Take 2 tablets (1,600 mg total) by mouth 3 (three) times daily with meals. 06/20/20  Yes Meccariello, Bernita Raisin, DO  simvastatin (ZOCOR) 20 MG tablet Take 20 mg by mouth daily.   Yes [provider]  aspirin EC 81 MG tablet Take 81 mg by mouth daily.  09/27/19   Rehman, Mechele Dawley, MD  fluticasone (FLONASE) 50 MCG/ACT nasal spray Place 1 spray into both nostrils at bedtime as needed for allergies.     [provider]  isosorbide mononitrate (IMDUR) 60 MG 24 hr tablet Take 1 tablet (60 mg total) by mouth in the morning and at bedtime. 10/20/20 01/18/21  Erma Heritage, PA-C  lidocaine-prilocaine (EMLA) cream Apply 1 application topically every Monday, Wednesday, and Friday. Prior to dialysis    [provider]  loperamide (IMODIUM A-D) 2 MG tablet Take 2 tablets (4 mg total) by mouth daily as needed for diarrhea or loose stools. 12/17/20   Thurnell Lose, MD  loratadine (CLARITIN) 10 MG tablet Take 1 tablet (10 mg total) by mouth daily as needed for allergies. 07/14/18   Hosie Poisson, MD  multivitamin (RENA-VIT) TABS tablet Take 1 tablet  by mouth daily.    [provider]  triamcinolone (KENALOG) 0.025 % ointment Apply 1 application topically 2 (two) times daily as needed (itching). 11/20/20   [provider]  triamcinolone cream (KENALOG) 0.1 % Apply 1 application topically 2 (two) times daily as needed (itching). 11/19/20   [provider]     Physical Exam: BP (!) 163/54   Pulse (!) 58   Temp 99.8 F (37.7 C) (Oral)   Resp (!) 24   SpO2 97%   General: 80 y.o. year-old female well developed well nourished in no acute distress.  Alert and oriented x3. HEENT: NCAT, EOMI Neck: Supple, trachea medial Cardiovascular: Regular rate and rhythm with no rubs or gallops.  No thyromegaly or JVD noted.  2/4 pulses in all 4 extremities. Respiratory: Tachypnea.  Mild rales bilaterally in lower lobes.  No wheezes  Abdomen: Soft, nontender nondistended with normal bowel sounds x4 quadrants. Muskuloskeletal: No cyanosis, clubbing or edema noted bilaterally Neuro: CN II-XII intact, strength 5/5 x 4, sensation, reflexes intact Skin: No ulcerative lesions noted or rashes Psychiatry: Judgement and insight appear normal. Mood is appropriate for condition and setting          Labs on Admission:  Basic Metabolic Panel: Recent Labs  Lab 02/03/21 1430  NA 134*  K 5.0  CL 95*  CO2 28  GLUCOSE 104*  BUN 49*  CREATININE 8.17*  CALCIUM 9.6   Liver Function Tests: Recent Labs  Lab 02/03/21 1430  AST 12*  ALT 11  ALKPHOS 58  BILITOT 0.8  PROT 6.2*  ALBUMIN 3.2*   No results for input(s): LIPASE, AMYLASE in the last 168 hours. No results for input(s): AMMONIA in the last 168 hours. CBC: Recent Labs  Lab 02/03/21 1430  WBC 10.9*  NEUTROABS 9.5*  HGB 8.9*  HCT 28.6*  MCV 120.2*  PLT 324   Cardiac Enzymes: No results for input(s): CKTOTAL, CKMB, CKMBINDEX, TROPONINI in the last 168 hours.  BNP (last 3 results) Recent Labs    09/29/20 0112  BNP 161.0*    ProBNP (last 3 results) No results for input(s): PROBNP in the last 8760 hours.  CBG: No results for input(s): GLUCAP in the last 168 hours.  Radiological Exams on Admission: DG Chest Port 1 View  Result Date: 02/03/2021 CLINICAL DATA:  Shortness of breath.  Blood in stool.  Weakness. EXAM: PORTABLE CHEST 1 VIEW COMPARISON:  01/22/2021 FINDINGS: Mild  enlargement of the cardiopericardial silhouette with pulmonary venous hypertension and interstitial accentuation. No blunting of the lateral costophrenic angles. Prior CABG. Atherosclerotic calcification of the aortic arch. Coronary stents noted. Left axillary stent noted. IMPRESSION: 1. Cardiomegaly with pulmonary venous hypertension and interstitial accentuation probably from interstitial edema. 2. Aortic Atherosclerosis (ICD10-I70.0). Electronically Signed   By: Van Clines M.D.   On: 02/03/2021 19:44    EKG: I independently viewed the EKG done and my findings are as followed: Normal sinus rhythm at a rate of 59 bpm with LBBB, QTc 493 ms  Assessment/Plan Present on Admission:  Acute respiratory failure with hypoxia (HCC)  Hypertensive urgency  Essential hypertension  GI bleed  GERD (gastroesophageal reflux disease)  Chronic diastolic CHF (congestive heart failure) (HCC)  Coronary artery disease  Principal Problem:   Acute respiratory failure with hypoxia (HCC) Active Problems:   ESRD on hemodialysis (HCC)   GERD (gastroesophageal reflux disease)   Chronic diastolic CHF (congestive heart failure) ([place])   Essential hypertension   Hypertensive urgency   Coronary artery disease  GI bleed   COVID-19 virus infection   Leukocytosis   Elevated MCV   Hypoalbuminemia due to protein-calorie malnutrition (HCC)   Hypothyroidism (acquired)   Acute respiratory failure possibly secondary to fluid overload/incidental COVID-19 virus infection ESRD on HD (MWF) Patient missed dialysis today Chest x-ray showed cardiomegaly with pulmonary venous hypertension and interstitial accentuation probably from interstitial edema Continue [place] Nephrology was consulted by ED physician and patient will undergo dialysis in the morning per ED physician Continue supplemental oxygen via [place] to maintain O2 sat > 92% with plan to wean patient off supplemental oxygen (patient does not use oxygen at  baseline).  Incidental COVID-19 virus infection SARS coronavirus 2 was positive Patient states that she had to COVID vaccines and a booster Continue IV Remdesivir per pharmacy dosing Continue vitamin-C 500 mg p.o. Daily Continue zinc 220 mg p.o. Daily Continue Mucinex, Robitussin  Continue supplemental oxygen to maintain O2 sat > or = 94% with plan to wean patient off supplemental oxygen as tolerated (of note, patient does not use oxygen at baseline) Continue incentive spirometry and flutter valve q73mn as tolerated Encourage proning, early ambulation, and side laying as tolerated Continue airborne isolation precaution Continue monitoring daily inflammatory markers  GI bleed Patient endorses blood in stool H/H= 8.9/20.6, this was 9.7/32.0 on 01/22/21 Hemoccult was positive Patient with history of prior diverticulosis on colonoscopy (03/09/2019) Continue IV Protonix  Gastroenterologist  will be consulted in the morning  Hypertensive urgency-resolved Essential hypertension Continue Lopressor, Imdur  Leukocytosis possibly reactive WBC 10.9, continue to monitor WBC with morning labs.  Elevated MCV MCV 120.2; B12 and folate levels will be checked  Prolonged QTc (493 ms) Avoid QT prolonging drugs Magnesium level will be checked Repeat EKG in the morning  Coronary artery disease  Patient was s/p CABG in 2013 b. DES to D1 in 10/2016. c. cath in 07/2018 showing patent grafts with occlusion of D1 at prior stent site and progression of PDA disease and medical management recommended Continue Lopressor, Lipitor, nitroglycerin, Imdur  Chronic diastolic CHF Echocardiogram done on 12/14/2018 showed LVEF of 60 to 65%.  There is mild concentric LVH.  LV diastolic Doppler measures are consistent with impaired relaxation. Continue home meds  Hypoalbuminemia secondary to mild protein calorie malnutrition Albumin 3.2, stable  Hypothyroidism Continue Synthroid   DVT prophylaxis: Heparin  subcu  Code Status: Full code  Family Communication: Husband at bedside (all questions answered to satisfaction)  Disposition Plan:  Patient is from:                        home Anticipated DC to:                   SNF or family members home Anticipated DC date:               2-3 days Anticipated DC barriers:          Patient requires inpatient management due to acute respiratory failure with hypoxia requiring supplemental oxygen.  Pending nephrology consults for dialysis, pending gastroenterology consult due to GI bleed  Consults called: Nephrology, gastroenterology  Admission status: Inpatient   OBernadette HoitMD [facility]  02/04/2021, 12:48 AM

## 2021-02-03 NOTE — ED Triage Notes (Signed)
Pt states she has had vaginal bleeding that started last night

## 2021-02-04 DIAGNOSIS — D62 Acute posthemorrhagic anemia: Secondary | ICD-10-CM

## 2021-02-04 DIAGNOSIS — K922 Gastrointestinal hemorrhage, unspecified: Secondary | ICD-10-CM | POA: Diagnosis not present

## 2021-02-04 DIAGNOSIS — J9601 Acute respiratory failure with hypoxia: Secondary | ICD-10-CM

## 2021-02-04 LAB — COMPREHENSIVE METABOLIC PANEL
ALT: 17 U/L (ref 0–44)
AST: 20 U/L (ref 15–41)
Albumin: 2.1 g/dL — ABNORMAL LOW (ref 3.5–5.0)
Alkaline Phosphatase: 37 U/L — ABNORMAL LOW (ref 38–126)
Anion gap: 27 — ABNORMAL HIGH (ref 5–15)
BUN: 49 mg/dL — ABNORMAL HIGH (ref 8–23)
CO2: 20 mmol/L — ABNORMAL LOW (ref 22–32)
Calcium: 6.7 mg/dL — ABNORMAL LOW (ref 8.9–10.3)
Chloride: 109 mmol/L (ref 98–111)
Creatinine, Ser: 7.09 mg/dL — ABNORMAL HIGH (ref 0.44–1.00)
GFR, Estimated: 5 mL/min — ABNORMAL LOW (ref >60)
Glucose, Bld: 70 mg/dL (ref 70–99)
Potassium: 4.2 mmol/L (ref 3.5–5.1)
Sodium: 156 mmol/L — ABNORMAL HIGH (ref 135–145)
Total Bilirubin: 0.5 mg/dL (ref 0.3–1.2)
Total Protein: 4 g/dL — ABNORMAL LOW (ref 6.5–8.1)

## 2021-02-04 LAB — CBC WITH DIFFERENTIAL/PLATELET
Abs Immature Granulocytes: 0.03 10*3/uL (ref 0.00–0.07)
Basophils Absolute: 0.1 10*3/uL (ref 0.0–0.1)
Basophils Relative: 2 %
Eosinophils Absolute: 0.1 10*3/uL (ref 0.0–0.5)
Eosinophils Relative: 1 %
HCT: 26.1 % — ABNORMAL LOW (ref 36.0–46.0)
Hemoglobin: 7.9 g/dL — ABNORMAL LOW (ref 12.0–15.0)
Immature Granulocytes: 1 %
Lymphocytes Relative: 11 %
Lymphs Abs: 0.7 10*3/uL (ref 0.7–4.0)
MCH: 36.6 pg — ABNORMAL HIGH (ref 26.0–34.0)
MCHC: 30.3 g/dL (ref 30.0–36.0)
MCV: 120.8 fL — ABNORMAL HIGH (ref 80.0–100.0)
Monocytes Absolute: 0.8 10*3/uL (ref 0.1–1.0)
Monocytes Relative: 13 %
Neutro Abs: 4.8 10*3/uL (ref 1.7–7.7)
Neutrophils Relative %: 72 %
Platelets: 295 10*3/uL (ref 150–400)
RBC: 2.16 MIL/uL — ABNORMAL LOW (ref 3.87–5.11)
RDW: 19.7 % — ABNORMAL HIGH (ref 11.5–15.5)
WBC: 6.5 10*3/uL (ref 4.0–10.5)
nRBC: 0 % (ref 0.0–0.2)

## 2021-02-04 LAB — RENAL FUNCTION PANEL
Albumin: 3 g/dL — ABNORMAL LOW (ref 3.5–5.0)
Anion gap: 16 — ABNORMAL HIGH (ref 5–15)
BUN: 67 mg/dL — ABNORMAL HIGH (ref 8–23)
CO2: 23 mmol/L (ref 22–32)
Calcium: 8.7 mg/dL — ABNORMAL LOW (ref 8.9–10.3)
Chloride: 95 mmol/L — ABNORMAL LOW (ref 98–111)
Creatinine, Ser: 10.06 mg/dL — ABNORMAL HIGH (ref 0.44–1.00)
GFR, Estimated: 4 mL/min — ABNORMAL LOW (ref >60)
Glucose, Bld: 112 mg/dL — ABNORMAL HIGH (ref 70–99)
Phosphorus: 6.1 mg/dL — ABNORMAL HIGH (ref 2.5–4.6)
Potassium: 5.9 mmol/L — ABNORMAL HIGH (ref 3.5–5.1)
Sodium: 134 mmol/L — ABNORMAL LOW (ref 135–145)

## 2021-02-04 LAB — CBC
HCT: 26.2 % — ABNORMAL LOW (ref 36.0–46.0)
Hemoglobin: 8.1 g/dL — ABNORMAL LOW (ref 12.0–15.0)
MCH: 37.2 pg — ABNORMAL HIGH (ref 26.0–34.0)
MCHC: 30.9 g/dL (ref 30.0–36.0)
MCV: 120.2 fL — ABNORMAL HIGH (ref 80.0–100.0)
Platelets: 308 10*3/uL (ref 150–400)
RBC: 2.18 MIL/uL — ABNORMAL LOW (ref 3.87–5.11)
RDW: 20 % — ABNORMAL HIGH (ref 11.5–15.5)
WBC: 7.5 10*3/uL (ref 4.0–10.5)
nRBC: 0 % (ref 0.0–0.2)

## 2021-02-04 LAB — FOLATE: Folate: 12.8 ng/mL (ref >5.9)

## 2021-02-04 LAB — FERRITIN: Ferritin: 930 ng/mL — ABNORMAL HIGH (ref 11–307)

## 2021-02-04 LAB — VITAMIN B12: Vitamin B-12: 381 pg/mL (ref 180–914)

## 2021-02-04 LAB — MAGNESIUM: Magnesium: 1.6 mg/dL — ABNORMAL LOW (ref 1.7–2.4)

## 2021-02-04 LAB — PHOSPHORUS: Phosphorus: 3.6 mg/dL (ref 2.5–4.6)

## 2021-02-04 LAB — C-REACTIVE PROTEIN: CRP: 3.1 mg/dL — ABNORMAL HIGH (ref <1.0)

## 2021-02-04 LAB — D-DIMER, QUANTITATIVE: D-Dimer, Quant: 0.48 ug/mL-FEU (ref 0.00–0.50)

## 2021-02-04 MED ORDER — ALTEPLASE 2 MG IJ SOLR
2.0000 mg | Freq: Once | INTRAMUSCULAR | Status: DC | PRN
  Filled 2021-02-04: qty 2

## 2021-02-04 MED ORDER — ATORVASTATIN CALCIUM 10 MG PO TABS
10.0000 mg | ORAL_TABLET | Freq: Every day | ORAL | Status: DC
  Administered 2021-02-04 – 2021-02-07 (×3): 10 mg via ORAL
  Filled 2021-02-04 (×4): qty 1

## 2021-02-04 MED ORDER — NITROGLYCERIN 0.4 MG SL SUBL: 0.4000 mg | SUBLINGUAL_TABLET | SUBLINGUAL | Status: DC | PRN

## 2021-02-04 MED ORDER — LEVOTHYROXINE SODIUM 50 MCG PO TABS: 25.0000 ug | ORAL_TABLET | Freq: Every day | ORAL | Status: DC

## 2021-02-04 MED ORDER — SEVELAMER CARBONATE 800 MG PO TABS
1600.0000 mg | ORAL_TABLET | Freq: Three times a day (TID) | ORAL | Status: DC
  Administered 2021-02-04 – 2021-02-07 (×7): 1600 mg via ORAL
  Filled 2021-02-04 (×8): qty 2

## 2021-02-04 MED ORDER — CHLORHEXIDINE GLUCONATE CLOTH 2 % EX PADS
6.0000 | MEDICATED_PAD | Freq: Every day | CUTANEOUS | Status: DC
  Administered 2021-02-06 – 2021-02-07 (×2): 6 via TOPICAL

## 2021-02-04 MED ORDER — LIDOCAINE-PRILOCAINE 2.5-2.5 % EX CREA: 1.0000 "application " | TOPICAL_CREAM | CUTANEOUS | Status: DC | PRN

## 2021-02-04 MED ORDER — HEPARIN SODIUM (PORCINE) 1000 UNIT/ML DIALYSIS: 1000.0000 [IU] | INTRAMUSCULAR | Status: DC | PRN

## 2021-02-04 MED ORDER — LEVOTHYROXINE SODIUM 25 MCG PO TABS
25.0000 ug | ORAL_TABLET | Freq: Every day | ORAL | Status: DC
  Administered 2021-02-04 – 2021-02-07 (×3): 25 ug via ORAL
  Filled 2021-02-04 (×4): qty 1

## 2021-02-04 MED ORDER — SODIUM CHLORIDE 0.9 % IV SOLN: 100.0000 mL | INTRAVENOUS | Status: DC | PRN

## 2021-02-04 MED ORDER — PENTAFLUOROPROP-TETRAFLUOROETH EX AERO: 1.0000 "application " | INHALATION_SPRAY | CUTANEOUS | Status: DC | PRN

## 2021-02-04 MED ORDER — LIDOCAINE HCL (PF) 1 % IJ SOLN: 5.0000 mL | INTRAMUSCULAR | Status: DC | PRN

## 2021-02-04 MED ORDER — OMEPRAZOLE 20 MG PO CPDR
20.0000 mg | DELAYED_RELEASE_CAPSULE | Freq: Two times a day (BID) | ORAL | Status: DC
  Administered 2021-02-06 – 2021-02-07 (×3): 20 mg via ORAL
  Filled 2021-02-04 (×8): qty 1

## 2021-02-04 MED ORDER — DARBEPOETIN ALFA 150 MCG/0.3ML IJ SOSY
150.0000 ug | PREFILLED_SYRINGE | INTRAMUSCULAR | Status: DC
  Filled 2021-02-04: qty 0.3

## 2021-02-04 NOTE — Consult Note (Signed)
ESRD Consult Note  Requesting provider: Rodena Goldmann, DO  Outpatient dialysis unit: [facility] Outpatient dialysis schedule: MWF  Assessment/Recommendations:   # ESRD:  -outpatient orders: J[facility] Docker  on MWF . EDW 61kg HD Bath 2K/2.5Ca  Time 3.5 hrs Heparin none. Access LUE AVG Nipro Cellentia 17h Blood flow 300 DFR 500     -HD today, will tentatively plan for HD tomorrow per her MWF schedule  # AHRF, pulmonary edema -UF as tolerated today  # [id number] positive -per primary service -will need covid chair at her HD unit prior to HD  # Volume/ hypertension: EDW 55.5kg. Attempt to achieve EDW as tolerated  # Anemia of Chronic Kidney Disease, GIB: Hemoglobin down to 7.9. Currently receiving epogen 14000 units qtreatment.  -h/o diverticulosis, GI on board -no heparin w/ HD  # Secondary Hyperparathyroidism/Hyperphosphatemia: resume home binders   # Vascular access: LUE AVG  # Additional recommendations: - Dose all meds for creatinine clearance < 10 ml/min  - Unless absolutely necessary, no MRIs with gadolinium.  - Implement save arm precautions.  Prefer needle sticks in the dorsum of the hands or wrists.  No blood pressure measurements in arm. - If blood transfusion is requested during hemodialysis sessions, please alert [place] prior to the session.  - If a hemodialysis catheter line culture is requested, please alert [place] as only hemodialysis nurses are able to collect those specimens.   Recommendations were discussed with the primary team.  Gean Quint, MD [facility]  History of Present Illness: Shawna [name] is a/an 80 y.o. female with a past medical history of ESRD, chronic diastolic heart failure, CAD status post CABG, hypertension, GERD, diverticulosis, recurrent GI bleeds, hyperlipidemia who presents with blood in stools, was also found to have hypoxia here with a O2 sat of 89% on room air.  She was also markedly hypertensive on admission (208/64).  COVID test  was positive here.  FOBT positive as well.  Last dialysis was on Monday (was not done yesterday due to the fact that she was in the ER, otherwise compliant with treatments). Patient not examined today in the context of COVID-19 positive and to limit exposure. Chart reviewed, coordinated with staff and primary service.  Medications:  Current Facility-Administered Medications  Medication Dose Route Frequency Provider Last Rate Last Admin   ascorbic acid (VITAMIN C) tablet 500 mg  500 mg Oral Daily Adefeso, Oladapo, DO       atorvastatin (LIPITOR) tablet 10 mg  10 mg Oral Daily Adefeso, Oladapo, DO       Chlorhexidine Gluconate Cloth 2 % PADS 6 each  6 each Topical Q0600 Gean Quint, MD       famotidine (PEPCID) 10 mg in sodium chloride 0.9 % 25 mL  10 mg Intravenous Q M,W,F-HD Adefeso, Oladapo, DO   Held at 02/03/21 2142   guaiFENesin-dextromethorphan (ROBITUSSIN DM) 100-10 MG/5ML syrup 10 mL  10 mL Oral Q4H PRN Adefeso, Oladapo, DO       levothyroxine (SYNTHROID) tablet 25 mcg  25 mcg Oral Q0600 Adefeso, Oladapo, DO   25 mcg at 02/04/21 8466   metoprolol tartrate (LOPRESSOR) tablet 25 mg  25 mg Oral BID Adefeso, Oladapo, DO   25 mg at 02/03/21 1920   nitroGLYCERIN (NITROSTAT) SL tablet 0.4 mg  0.4 mg Sublingual Q5 Min x 3 PRN Adefeso, Oladapo, DO       remdesivir 100 mg in sodium chloride 0.9 % 100 mL IVPB  100 mg Intravenous Daily Adefeso, Oladapo, DO  sevelamer carbonate (RENVELA) tablet 1,600 mg  1,600 mg Oral TID WC Adefeso, Oladapo, DO       zinc sulfate capsule 220 mg  220 mg Oral Daily Adefeso, Oladapo, DO       Current Outpatient Medications  Medication Sig Dispense Refill   atorvastatin (LIPITOR) 10 MG tablet Take 10 mg by mouth daily.     cyclobenzaprine (FLEXERIL) 5 MG tablet Take 5 mg by mouth 3 (three) times daily as needed for muscle spasms.     hydrOXYzine (VISTARIL) 100 MG capsule Take 100 mg by mouth 2 (two) times daily as needed for anxiety or itching.     levothyroxine  (SYNTHROID) 25 MCG tablet Take 1 tablet (25 mcg total) by mouth daily. 90 tablet 0   metoprolol tartrate (LOPRESSOR) 25 MG tablet Take 1 tablet (25 mg total) by mouth 2 (two) times daily.     midodrine (PROAMATINE) 5 MG tablet Take Monday, Wednesday, Friday morning BEFORE dialysis (Patient taking differently: Take 5 mg by mouth See admin instructions. Take Monday, Wednesday, Friday morning BEFORE dialysis) 48 tablet 6   nitroGLYCERIN (NITROSTAT) 0.4 MG SL tablet Place 1 tablet (0.4 mg total) under the tongue every 5 (five) minutes x 3 doses as needed for chest pain (if no relief after 2nd dose, proceed to the ED for an evaluation or call 911). 25 tablet 2   omeprazole (PRILOSEC) 20 MG capsule Take 1 capsule (20 mg total) by mouth daily. 90 capsule 3   sevelamer carbonate (RENVELA) 800 MG tablet Take 2 tablets (1,600 mg total) by mouth 3 (three) times daily with meals. 180 tablet 1   simvastatin (ZOCOR) 20 MG tablet Take 20 mg by mouth daily.     aspirin EC 81 MG tablet Take 81 mg by mouth daily.      fluticasone (FLONASE) 50 MCG/ACT nasal spray Place 1 spray into both nostrils at bedtime as needed for allergies.      isosorbide mononitrate (IMDUR) 60 MG 24 hr tablet Take 1 tablet (60 mg total) by mouth in the morning and at bedtime. 180 tablet 3   lidocaine-prilocaine (EMLA) cream Apply 1 application topically every Monday, Wednesday, and Friday. Prior to dialysis     loperamide (IMODIUM A-D) 2 MG tablet Take 2 tablets (4 mg total) by mouth daily as needed for diarrhea or loose stools.     loratadine (CLARITIN) 10 MG tablet Take 1 tablet (10 mg total) by mouth daily as needed for allergies. 30 tablet 0   multivitamin (RENA-VIT) TABS tablet Take 1 tablet by mouth daily.     triamcinolone (KENALOG) 0.025 % ointment Apply 1 application topically 2 (two) times daily as needed (itching).     triamcinolone cream (KENALOG) 0.1 % Apply 1 application topically 2 (two) times daily as needed (itching).        ALLERGIES Aspirin, Penicillins, Amlodipine, Bactrim [sulfamethoxazole-trimethoprim], Contrast media [iodinated diagnostic agents], Iron, Nitrofurantoin, Tylenol [acetaminophen], Gabapentin, Hydralazine, Levofloxacin, No known allergies, Ranexa [ranolazine], Dexilant [dexlansoprazole], Levaquin [levofloxacin in d5w], Morphine and related, Plavix [clopidogrel bisulfate], Protonix [pantoprazole sodium], and Venofer [ferric oxide]  MEDICAL HISTORY Past Medical History:  Diagnosis Date   Acute on chronic respiratory failure with hypoxia ([place]) 10/10/2016   Anxiety    Arthritis    AVM (arteriovenous malformation) of colon    CAD (coronary artery disease)    a. s/p CABG in 2013 b. DES to D1 in 10/2016. c. cath in 07/2018 showing patent grafts with occlusion of D1 at prior stent site  and progression of PDA disease --> medical management recommended   Carotid artery disease ([place])    a. 16-60% LICA, 12/3014    Chronic anemia    Chronic bronchitis ([facility])    Chronic diastolic CHF (congestive heart failure) ([place])    a. 02/2012 Echo EF 60-65%, nl wall motion, Gr 1 DD, mod MR   Colon cancer ([place]) 1992   Esophageal stricture    ESRD on hemodialysis ([place])    ESRD due to HTN, started dialysis 2011 and gets HD at [facility] with Dr Hinda Lenis on MWF schedule.  Access is LUA AVF as of Sept 2014.    GERD (gastroesophageal reflux disease)    High cholesterol 12/2011   History of blood transfusion 07/2011; 12/2011; 01/2012 X 2; 04/2012   History of gout    History of lower GI bleeding    Hypertension    Iron deficiency anemia    Mitral regurgitation    a. Moderate by echo, 02/2012   Myocardial infarction [facility])    NSVT (nonsustained ventricular tachycardia) (H[facility])    Ovarian cancer ([place]) 1992   PAF (paroxysmal atrial fibrillation) ([place])    Pneumonia ~ 2009   PUD (peptic ulcer disease)    TIA (transient ischemic attack)      SOCIAL HISTORY Social History   Socioeconomic History   Marital status: Married     Spouse name: [name]   Number of children: Not on file   Years of education: Not on file   Highest education level: Not on file  Occupational History   Not on file  Tobacco Use   Smoking status: Never   Smokeless tobacco: Never  Vaping Use   Vaping Use: Never used  Substance and Sexual Activity   Alcohol use: No    Alcohol/week: 0.0 standard drinks   Drug use: No   Sexual activity: Yes    Birth control/protection: Surgical  Other Topics Concern   Not on file  Social History Narrative   Lives in [place], [place] with husband.  Dialysis pt - [facility].   Social Determinants of Health   Financial Resource Strain: Not on file  Food Insecurity: Not on file  Transportation Needs: Not on file  Physical Activity: Not on file  Stress: Not on file  Social Connections: Not on file  Intimate Partner Violence: Not on file     FAMILY HISTORY Family History  Problem Relation Age of Onset   Heart disease Mother        Heart Disease before age 65   Hyperlipidemia Mother    Hypertension Mother    Diabetes Mother    Heart attack Mother    Heart disease Father        Heart Disease before age 83   Hyperlipidemia Father    Hypertension Father    Diabetes Father    Diabetes Sister    Hypertension Sister    Diabetes Brother    Hyperlipidemia Brother    Heart attack Brother    Hypertension Sister    Heart attack Brother    Colon cancer Child 32   Other Other        noncontributory for early CAD   Esophageal cancer Neg Hx    Liver disease Neg Hx    Kidney disease Neg Hx    Colon polyps Neg Hx      Review of Systems: 12 systems were reviewed and negative except per HPI  Physical Exam: Vitals:   02/04/21 0500 02/04/21 0600  BP: Marland Kitchen)  175/59 (!) 183/57  Pulse:  66  Resp: 20 19  Temp:  97.9 F (36.6 C)  SpO2:  97%   No intake/output data recorded. No intake or output data in the 24 hours ending 02/04/21 0806 Not examined due to patient being COVID positive and to limit  exposure.  Test Results Reviewed Lab Results  Component Value Date   NA 156 (H) 02/04/2021   K 4.2 02/04/2021   CL 109 02/04/2021   CO2 20 (L) 02/04/2021   BUN 49 (H) 02/04/2021   CREATININE 7.09 (H) 02/04/2021   CALCIUM 6.7 (L) 02/04/2021   ALBUMIN 2.1 (L) 02/04/2021   PHOS 3.6 02/04/2021    I have reviewed relevant outside healthcare records

## 2021-02-04 NOTE — Procedures (Signed)
   HEMODIALYSIS TREATMENT NOTE:  Pre-HD BP 166/61 and spO2 100% on 2L O2 via [place].  Treatment was initiated with a 3 liter goal (3-4L range ordered).  Pt typically has labile blood pressures during HD.    Symptomatic hypotension 85/42----81/62 about 45 minutes into tx requiring three 200cc NS boluses to correct.  Subsequently became increasingly hypoxic requiring O2^ to 4L.  Pt was assessed by RT and SWOT nurse and within 15 minutes was back to baseline (A/O x 2).  UF goal was resumed at a lower rate.  3.5 hour session was completed.  Goal had to be decreased once more for declining BP.  Net UF only 1.3 liters but pt was weaned off O2 and saturating 95% on room air. Lungs with diminished bases otherwise clear.  All blood was returned and hemostasis was achieved in 15 minutes.    Shawna Alexandria, RN

## 2021-02-04 NOTE — Consult Note (Signed)
Referring Provider: Rodena Goldmann, DO Primary Care Physician:  [facility] Primary Gastroenterologist:  Dr. Hildred Laser  Reason for Consultation:  GI bleeding  HPI: Shawna [name] is a 80 y.o. female with past medical history significant for colon cancer status right hemicolectomy and 1992, large colon adenoma removed piecemeal in August 2020, small duodenal tubular adenoma removed via EGD March 2021, H/O AVMs in jejunum (2018) and AVMs duodenum (2014), CAD status post stent placement and CABG, CHF, end-stage renal disease on hemodialysis, hypertension, A. fib, TIA, ovarian cancer status post hysterectomy 1992, hypothyroidism, GERD presenting to the emergency department with 2 to 3-day history of blood in stool.  Patient states she had several BMs with dark blood in stool. Saw dark clots. Had some vague abdominal pain after this started. Denies abdominal pain last week. She is a difficult historian. Denies dysphagia, vomiting, heartburn, constipation, diarrhea. She says she has felt tired and has had a mild cough "for awhile". Missed dialysis yesterday. She denies blood thinners, NSAIDs, ASA powders.   In the ED: Stool was noted to be black, heme positive.  Creatinine 8.17, BUN 49, potassium 5, sodium 134, white blood cell count 10,900, hemoglobin 8.9 (down from 9.72 weeks prior but baseline ranging from anywhere from 7 to 9 g/dL over the past several months.), hematocrit 28.6, MCV 120.2, platelets 324,000.  Hemoglobin this morning 7.9.  INR 1.  Total bilirubin 0.8, alkaline phosphatase 58, AST 12, ALT 11, albumin 3.2.  SARS coronavirus 2 positive.  Chest x-ray with cardiomegaly with pulmonary venous hypertension and interstitial insinuation probably interstitial edema, aortic atherosclerosis.   EGD March 2021: - Normal esophagus. - Z-line regular, 34 cm from the incisors. - 3 cm hiatal hernia. - Normal stomach. - Normal duodenal bulb. - A single duodenal polyp. Biopsied.   (Duodenal adenoma) - Normal third portion of the duodenum. No polyp found.   Colonoscopy August 2020: - Diverticulosis in the entire examined colon. - Patent end-to-side ileo-colonic anastomosis. - Two small polyps in the proximal ascending colon, removed with a cold snare. Resected and retrieved. - One large polyp in the proximal transverse colon, removed piecemeal using a hot snare. Complete resection. Partial retrieval. Injected.  -Path revealed all 3 polyps tubular adenomas, next colonoscopy in 3 years if health permits  Small bowel capsule study 02/2019: Bulbar duodenitis without stigmata of bleed. 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  EGD 07/29/2016 by Dr. Sheridan Cellar due to GIB/melena:  - 2 cm hiatal hernia. - Normal stomach. - A single duodenal versus jejunal polyp - suspect adenoma, not removed during this exam as above. - A single angiodysplastic lesion in the jejunum. This could be the cause for the patient's symptoms / anemia. Treated successfully with argon plasma coagulation (APC).   EGD 04/30/2015 by Dr. Hildred Laser done due to GIB/melena: Small sliding hiatal hernia with mild changes of reflux esophagitis limited to GE junction.Erosive gastroduodenitis but no evidence of peptic ulcer disease. Mild antral telangiectasia without stigmata of bleed   EGD 03/26/2013 by Dr. [name] at [facility]: nonbleeding duodenal AVMs s/p APC, incidental esophageal stricture.  Prior to Admission medications   Medication Sig Start Date End Date Taking? Authorizing Provider  atorvastatin (LIPITOR) 10 MG tablet Take 10 mg by mouth daily.   Yes [provider]  cyclobenzaprine (FLEXERIL) 5 MG tablet Take 5 mg by mouth 3 (three) times daily as needed for muscle spasms. 11/30/20  Yes [provider]  hydrOXYzine (VISTARIL) 100 MG capsule Take 100 mg by mouth 2 (two) times daily as needed for anxiety or itching.  11/23/20  Yes [provider]  levothyroxine (SYNTHROID) 25 MCG tablet Take 1 tablet (25 mcg total) by mouth daily. 02/02/21  Yes Brita Romp, NP  metoprolol tartrate (LOPRESSOR) 25 MG tablet Take 1 tablet (25 mg total) by mouth 2 (two) times daily. 12/17/20  Yes Thurnell Lose, MD  midodrine (PROAMATINE) 5 MG tablet Take Monday, Wednesday, Friday morning BEFORE dialysis Patient taking differently: Take 5 mg by mouth See admin instructions. Take Monday, Wednesday, Friday morning BEFORE dialysis 12/22/20  Yes Branch, Alphonse Guild, MD  nitroGLYCERIN (NITROSTAT) 0.4 MG SL tablet Place 1 tablet (0.4 mg total) under the tongue every 5 (five) minutes x 3 doses as needed for chest pain (if no relief after 2nd dose, proceed to the ED for an evaluation or call 911). 09/08/20  Yes Verta Ellen., NP  omeprazole (PRILOSEC) 20 MG capsule Take 1 capsule (20 mg total) by mouth daily. 12/01/20  Yes Rehman, Mechele Dawley, MD  sevelamer carbonate (RENVELA) 800 MG tablet Take 2 tablets (1,600 mg total) by mouth 3 (three) times daily with meals. 06/20/20  Yes Meccariello, Bernita Raisin, DO  simvastatin (ZOCOR) 20 MG tablet Take 20 mg by mouth daily.   Yes [provider]  aspirin EC 81 MG tablet Take 81 mg by mouth daily.  09/27/19   Rehman, Mechele Dawley, MD  fluticasone (FLONASE) 50 MCG/ACT nasal spray Place 1 spray into both nostrils at bedtime as needed for allergies.     [provider]  isosorbide mononitrate (IMDUR) 60 MG 24 hr tablet Take 1 tablet (60 mg total) by mouth in the morning and at bedtime. 10/20/20 01/18/21  Erma Heritage, PA-C  lidocaine-prilocaine (EMLA) cream Apply 1 application topically every Monday, Wednesday, and Friday. Prior to dialysis    [provider]  loperamide (IMODIUM A-D) 2 MG tablet Take 2 tablets (4 mg total) by mouth daily as needed for diarrhea or loose stools. 12/17/20   Thurnell Lose, MD  loratadine (CLARITIN) 10 MG tablet Take 1 tablet (10 mg  total) by mouth daily as needed for allergies. 07/14/18   Hosie Poisson, MD  multivitamin (RENA-VIT) TABS tablet Take 1 tablet by mouth daily.    [provider]  triamcinolone (KENALOG) 0.025 % ointment Apply 1 application topically 2 (two) times daily as needed (itching). 11/20/20   [provider]  triamcinolone cream (KENALOG) 0.1 % Apply 1 application topically 2 (two) times daily as needed (itching). 11/19/20   [provider]    Current Facility-Administered Medications  Medication Dose Route Frequency Provider Last Rate Last Admin   ascorbic acid (VITAMIN C) tablet 500 mg  500 mg Oral Daily Adefeso, Oladapo, DO       atorvastatin (LIPITOR) tablet 10 mg  10 mg Oral Daily Adefeso, Oladapo, DO       Chlorhexidine Gluconate Cloth 2 % PADS 6 each  6 each Topical Q0600 Gean Quint, MD       famotidine (PEPCID) 10 mg in sodium chloride 0.9 % 25 mL  10 mg Intravenous Q M,W,F-HD Adefeso, Oladapo, DO   Held at 02/03/21 2142   guaiFENesin-dextromethorphan (ROBITUSSIN DM) 100-10 MG/5ML syrup 10 mL  10 mL Oral Q4H PRN Adefeso, Oladapo, DO       levothyroxine (SYNTHROID) tablet 25 mcg  25 mcg Oral Q0600 Adefeso, Oladapo, DO   25 mcg at 02/04/21  7616   metoprolol tartrate (LOPRESSOR) tablet 25 mg  25 mg Oral BID Adefeso, Oladapo, DO   25 mg at 02/03/21 1920   nitroGLYCERIN (NITROSTAT) SL tablet 0.4 mg  0.4 mg Sublingual Q5 Min x 3 PRN Adefeso, Oladapo, DO       remdesivir 100 mg in sodium chloride 0.9 % 100 mL IVPB  100 mg Intravenous Daily Adefeso, Oladapo, DO       sevelamer carbonate (RENVELA) tablet 1,600 mg  1,600 mg Oral TID WC Adefeso, Oladapo, DO       zinc sulfate capsule 220 mg  220 mg Oral Daily Adefeso, Oladapo, DO       Current Outpatient Medications  Medication Sig Dispense Refill   atorvastatin (LIPITOR) 10 MG tablet Take 10 mg by mouth daily.     cyclobenzaprine (FLEXERIL) 5 MG tablet Take 5 mg by mouth 3 (three) times daily as needed for muscle spasms.      hydrOXYzine (VISTARIL) 100 MG capsule Take 100 mg by mouth 2 (two) times daily as needed for anxiety or itching.     levothyroxine (SYNTHROID) 25 MCG tablet Take 1 tablet (25 mcg total) by mouth daily. 90 tablet 0   metoprolol tartrate (LOPRESSOR) 25 MG tablet Take 1 tablet (25 mg total) by mouth 2 (two) times daily.     midodrine (PROAMATINE) 5 MG tablet Take Monday, Wednesday, Friday morning BEFORE dialysis (Patient taking differently: Take 5 mg by mouth See admin instructions. Take Monday, Wednesday, Friday morning BEFORE dialysis) 48 tablet 6   nitroGLYCERIN (NITROSTAT) 0.4 MG SL tablet Place 1 tablet (0.4 mg total) under the tongue every 5 (five) minutes x 3 doses as needed for chest pain (if no relief after 2nd dose, proceed to the ED for an evaluation or call 911). 25 tablet 2   omeprazole (PRILOSEC) 20 MG capsule Take 1 capsule (20 mg total) by mouth daily. 90 capsule 3   sevelamer carbonate (RENVELA) 800 MG tablet Take 2 tablets (1,600 mg total) by mouth 3 (three) times daily with meals. 180 tablet 1   simvastatin (ZOCOR) 20 MG tablet Take 20 mg by mouth daily.     aspirin EC 81 MG tablet Take 81 mg by mouth daily.      fluticasone (FLONASE) 50 MCG/ACT nasal spray Place 1 spray into both nostrils at bedtime as needed for allergies.      isosorbide mononitrate (IMDUR) 60 MG 24 hr tablet Take 1 tablet (60 mg total) by mouth in the morning and at bedtime. 180 tablet 3   lidocaine-prilocaine (EMLA) cream Apply 1 application topically every Monday, Wednesday, and Friday. Prior to dialysis     loperamide (IMODIUM A-D) 2 MG tablet Take 2 tablets (4 mg total) by mouth daily as needed for diarrhea or loose stools.     loratadine (CLARITIN) 10 MG tablet Take 1 tablet (10 mg total) by mouth daily as needed for allergies. 30 tablet 0   multivitamin (RENA-VIT) TABS tablet Take 1 tablet by mouth daily.     triamcinolone (KENALOG) 0.025 % ointment Apply 1 application topically 2 (two) times daily as needed  (itching).     triamcinolone cream (KENALOG) 0.1 % Apply 1 application topically 2 (two) times daily as needed (itching).      Allergies as of 02/03/2021 - Review Complete 02/03/2021  Allergen Reaction Noted   Aspirin Other (See Comments) 12/15/2011   Penicillins Other (See Comments) 12/15/2011   Amlodipine Swelling 01/19/2015   Bactrim [sulfamethoxazole-trimethoprim] Rash 12/15/2011  Contrast media [iodinated diagnostic agents] Itching 12/15/2011   Iron Itching and Other (See Comments) 05/02/2012   Nitrofurantoin Hives 05/27/2015   Tylenol [acetaminophen] Itching and Other (See Comments) 07/08/2016   Gabapentin Other (See Comments) 02/08/2017   Hydralazine Itching 12/14/2018   Levofloxacin  10/20/2020   No known allergies  11/05/2020   Ranexa [ranolazine] Other (See Comments) 08/30/2020   Dexilant [dexlansoprazole] Other (See Comments) 05/29/2013   Levaquin [levofloxacin in d5w] Rash 10/04/2013   Morphine and related Itching and Other (See Comments) 09/16/2014   Plavix [clopidogrel bisulfate] Rash 01/05/2012   Protonix [pantoprazole sodium] Rash 03/21/2013   Venofer [ferric oxide] Itching and Other (See Comments) 05/02/2012    Past Medical History:  Diagnosis Date   Acute on chronic respiratory failure with hypoxia ([place]) 10/10/2016   Anxiety    Arthritis    AVM (arteriovenous malformation) of colon    CAD (coronary artery disease)    a. s/p CABG in 2013 b. DES to D1 in 10/2016. c. cath in 07/2018 showing patent grafts with occlusion of D1 at prior stent site and progression of PDA disease --> medical management recommended   Carotid artery disease ([place])    a. 26-20% LICA, 09/5595    Chronic anemia    Chronic bronchitis (HCC)    Chronic diastolic CHF (congestive heart failure) ([place])    a. 02/2012 Echo EF 60-65%, nl wall motion, Gr 1 DD, mod MR   Colon cancer ([place]) 1992   Esophageal stricture    ESRD on hemodialysis ([place])    ESRD due to HTN, started dialysis 2011 and gets HD  at [facility] with Dr Hinda Lenis on MWF schedule.  Access is LUA AVF as of Sept 2014.    GERD (gastroesophageal reflux disease)    High cholesterol 12/2011   History of blood transfusion 07/2011; 12/2011; 01/2012 X 2; 04/2012   History of gout    History of lower GI bleeding    Hypertension    Iron deficiency anemia    Mitral regurgitation    a. Moderate by echo, 02/2012   Myocardial infarction [facility])    NSVT (nonsustained ventricular tachycardia) ([place])    Ovarian cancer ([place]) 1992   PAF (paroxysmal atrial fibrillation) ([place])    Pneumonia ~ 2009   PUD (peptic ulcer disease)    TIA (transient ischemic attack)     Past Surgical History:  Procedure Laterality Date   A/V SHUNTOGRAM Left 03/19/2019   Procedure: A/V SHUNTOGRAM;  Surgeon: Katha Cabal, MD;  Location: [place] [facility];  Service: Cardiovascular;  Laterality: Left;   ABDOMINAL HYSTERECTOMY  1992   APPENDECTOMY  06/1990   AV FISTULA PLACEMENT  07/2009   left upper arm   AV FISTULA PLACEMENT Right 09/06/2016   Procedure: RIGHT FOREARM ARTERIOVENOUS (AV) GRAFT;  Surgeon: Elam Dutch, MD;  Location: [place];  Service: Vascular;  Laterality: Right;   AV FISTULA PLACEMENT N/A 02/24/2017   Procedure: INSERTION OF ARTERIOVENOUS (AV) GORE-TEX GRAFT ARM (BRACHIAL AXILLARY);  Surgeon: Katha Cabal, MD;  Location: [facility] ORS;  Service: Vascular;  Laterality: N/A;   [place] Right 09/06/2016   Procedure: REMOVAL OF Right Arm ARTERIOVENOUS GORETEX GRAFT and Vein Patch angioplasty of brachial artery;  Surgeon: Angelia Mould, MD;  Location: [place];  Service: Vascular;  Laterality: Right;   BIOPSY  09/26/2019   Procedure: BIOPSY;  Surgeon: Rogene Houston, MD;  [facility];  Service: Endoscopy;;   Andie  COLON SURGERY     COLONOSCOPY N/A 03/09/2019   Procedure: COLONOSCOPY;  Surgeon: Rogene Houston, MD;  [facility];  Service: Endoscopy;  Laterality: N/A;   CORONARY ANGIOPLASTY  WITH STENT PLACEMENT  12/15/11   "2"   CORONARY ANGIOPLASTY WITH STENT PLACEMENT  y/2013   "1; makes total of 3" (05/02/2012)   CORONARY ARTERY BYPASS GRAFT  06/13/2012   Procedure: CORONARY ARTERY BYPASS GRAFTING (CABG);  Surgeon: Grace Isaac, MD;  Location: [place];  Service: Open Heart Surgery;  Laterality: N/A;  cabg x four;  using left internal mammary artery, and left leg greater saphenous vein harvested endoscopically   CORONARY STENT INTERVENTION N/A 10/13/2016   Procedure: Coronary Stent Intervention;  Surgeon: Troy Sine, MD;  Location: [place] [facility];  Service: Cardiovascular;  Laterality: N/A;   DIALYSIS/PERMA CATHETER REMOVAL N/A 04/18/2017   Procedure: DIALYSIS/PERMA CATHETER REMOVAL;  Surgeon: Katha Cabal, MD;  Location: [name] [facility];  Service: Cardiovascular;  Laterality: N/A;   DILATION AND CURETTAGE OF UTERUS     ESOPHAGOGASTRODUODENOSCOPY  01/20/2012   Procedure: ESOPHAGOGASTRODUODENOSCOPY (EGD);  Surgeon: Ladene Artist, MD,FACG;  Location: [facility] ENDOSCOPY;  Service: Endoscopy;  Laterality: N/A;   ESOPHAGOGASTRODUODENOSCOPY N/A 03/26/2013   Procedure: ESOPHAGOGASTRODUODENOSCOPY (EGD);  Surgeon: Irene Shipper, MD;  Location: [facility] ENDOSCOPY;  Service: Endoscopy;  Laterality: N/A;   ESOPHAGOGASTRODUODENOSCOPY N/A 04/30/2015   Procedure: ESOPHAGOGASTRODUODENOSCOPY (EGD);  Surgeon: Rogene Houston, MD;  [facility];  Service: Endoscopy;  Laterality: N/A;  1pm - moved to 10/20 @ 1:10   ESOPHAGOGASTRODUODENOSCOPY N/A 07/29/2016   Procedure: ESOPHAGOGASTRODUODENOSCOPY (EGD);  Surgeon: Manus Gunning, MD;  Location: [place];  Service: Gastroenterology;  Laterality: N/A;  enteroscopy   ESOPHAGOGASTRODUODENOSCOPY N/A 09/26/2019   Procedure: ESOPHAGOGASTRODUODENOSCOPY (EGD);  Surgeon: Rogene Houston, MD;  [facility];  Service: Endoscopy;  Laterality: N/A;  [place] 03/07/2019   Procedure: GIVENS CAPSULE STUDY;   Surgeon: Rogene Houston, MD;  [facility];  Service: Endoscopy;  Laterality: N/A;  7:30   INSERTION OF DIALYSIS CATHETER N/A 10/05/2020   Procedure: ABORTED TUNNELED DIALYSIS CATHETER PLACEMENT RIGHT INTERNAL JUGULAR VEIN ;  Surgeon: Virl Cagey, MD;  [facility];  Service: General;  Laterality: N/A;   INTRAOPERATIVE TRANSESOPHAGEAL ECHOCARDIOGRAM  06/13/2012   Procedure: INTRAOPERATIVE TRANSESOPHAGEAL ECHOCARDIOGRAM;  Surgeon: Grace Isaac, MD;  Location: [place];  Service: Open Heart Surgery;  Laterality: N/A;   IR DIALY SHUNT INTRO NEEDLE/INTRACATH INITIAL W/IMG LEFT Left 10/06/2020   IR FLUORO GUIDE CV LINE RIGHT  06/17/2020   IR GENERIC HISTORICAL  07/26/2016   IR FLUORO GUIDE CV LINE RIGHT 07/26/2016 Greggory Keen, MD [facility]   IR GENERIC HISTORICAL  07/26/2016   IR US GUIDE VASC ACCESS RIGHT 07/26/2016 Greggory Keen, MD [facility]   IR GENERIC HISTORICAL  08/02/2016   IR US GUIDE VASC ACCESS RIGHT 08/02/2016 Greggory Keen, MD [facility]   IR GENERIC HISTORICAL  08/02/2016   IR FLUORO GUIDE CV LINE RIGHT 08/02/2016 Greggory Keen, MD [facility]   IR RADIOLOGY PERIPHERAL GUIDED IV START  03/28/2017   IR REMOVAL TUN CV CATH W/O FL  08/11/2020   IR THROMBECTOMY AV FISTULA W/THROMBOLYSIS INC/SHUNT/IMG LEFT Left 06/17/2020   IR US GUIDE VASC ACCESS LEFT  06/17/2020   IR US GUIDE VASC ACCESS RIGHT  03/28/2017   IR US GUIDE VASC ACCESS RIGHT  06/17/2020   LEFT HEART  CATH AND CORONARY ANGIOGRAPHY N/A 09/20/2016   Procedure: Left Heart Cath and Coronary Angiography;  Surgeon: [name], MD;  Location: [place] [facility];  Service: Cardiovascular;  Laterality: N/A;   LEFT HEART CATH AND CORS/GRAFTS ANGIOGRAPHY N/A 10/13/2016   Procedure: Left Heart Cath and Cors/Grafts Angiography;  Surgeon: Troy Sine, MD;  Location: [place] [facility];  Service: Cardiovascular;  Laterality: N/A;   LEFT HEART CATH AND CORS/GRAFTS ANGIOGRAPHY N/A 07/13/2018   Procedure: LEFT HEART  CATH AND CORS/GRAFTS ANGIOGRAPHY;  Surgeon: Martinique, Peter M, MD;  Location: [place] [facility];  Service: Cardiovascular;  Laterality: N/A;   LEFT HEART CATHETERIZATION WITH CORONARY ANGIOGRAM N/A 12/15/2011   Procedure: LEFT HEART CATHETERIZATION WITH CORONARY ANGIOGRAM;  Surgeon: Burnell Blanks, MD;  [facility] C[facility] LAB;  Service: Cardiovascular;  Laterality: N/A;   LEFT HEART CATHETERIZATION WITH CORONARY ANGIOGRAM N/A 01/10/2012   Procedure: LEFT HEART CATHETERIZATION WITH CORONARY ANGIOGRAM;  Surgeon: Peter M Martinique, MD;  Location: [facility] CATH LAB;  Service: Cardiovascular;  Laterality: N/A;   LEFT HEART CATHETERIZATION WITH CORONARY ANGIOGRAM N/A 06/08/2012   Procedure: LEFT HEART CATHETERIZATION WITH CORONARY ANGIOGRAM;  Surgeon: Burnell Blanks, MD;  Location: [facility] CATH LAB;  Service: Cardiovascular;  Laterality: N/A;   LEFT HEART CATHETERIZATION WITH CORONARY/GRAFT ANGIOGRAM N/A 12/10/2013   Procedure: LEFT HEART CATHETERIZATION WITH Beatrix Fetters;  Surgeon: [name], MD;  Location: [facility] CATH LAB;  Service: Cardiovascular;  Laterality: N/A;   OVARY SURGERY     ovarian cancer   POLYPECTOMY  03/09/2019   Procedure: POLYPECTOMY;  Surgeon: Rogene Houston, MD;  [facility];  Service: Endoscopy;;  cecal    POLYPECTOMY N/A 09/26/2019   Procedure: DUODENAL POLYPECTOMY;  Surgeon: Rogene Houston, MD;  [facility];  Service: Endoscopy;  Laterality: N/A;   REVISION OF ARTERIOVENOUS GORETEX GRAFT N/A 02/24/2017   Procedure: REVISION OF ARTERIOVENOUS GORETEX GRAFT (RESECTION);  Surgeon: Katha Cabal, MD;  Location: [facility] ORS;  Service: Vascular;  Laterality: N/A;   REVISON OF ARTERIOVENOUS FISTULA Left 06/19/2020   Procedure: REVISION OF LEFT UPPER ARM AV GRAFT WITH INTERPOSITION JUMP GRAFT USING 6MM GORE LIMB;  Surgeon: Marty Heck, MD;  Location: [place];  Service: Vascular;  Laterality: Left;   SHUNTOGRAM N/A 10/15/2013   Procedure:  Fistulogram;  Surgeon: Serafina Mitchell, MD;  Location: [facility] CATH LAB;  Service: Cardiovascular;  Laterality: N/A;   THROMBECTOMY / ARTERIOVENOUS GRAFT REVISION  2011   left upper arm   TUBAL LIGATION  1980's   UPPER EXTREMITY ANGIOGRAPHY Bilateral 12/06/2016   Procedure: Upper Extremity Angiography;  Surgeon: Katha Cabal, MD;  Location: [place] [facility];  Service: Cardiovascular;  Laterality: Bilateral;   UPPER EXTREMITY INTERVENTION Left 06/06/2017   Procedure: UPPER EXTREMITY INTERVENTION;  Surgeon: Katha Cabal, MD;  Location: [place] [facility];  Service: Cardiovascular;  Laterality: Left;    Family History  Problem Relation Age of Onset   Heart disease Mother        Heart Disease before age 65   Hyperlipidemia Mother    Hypertension Mother    Diabetes Mother    Heart attack Mother    Heart disease Father        Heart Disease before age 62   Hyperlipidemia Father    Hypertension Father    Diabetes Father    Diabetes Sister    Hypertension Sister    Diabetes Brother    Hyperlipidemia Brother  Heart attack Brother    Hypertension Sister    Heart attack Brother    Colon cancer Child 74   Other Other        noncontributory for early CAD   Esophageal cancer Neg Hx    Liver disease Neg Hx    Kidney disease Neg Hx    Colon polyps Neg Hx     Social History   Socioeconomic History   Marital status: Married    Spouse name: [name]   Number of children: Not on file   Years of education: Not on file   Highest education level: Not on file  Occupational History   Not on file  Tobacco Use   Smoking status: Never   Smokeless tobacco: Never  Vaping Use   Vaping Use: Never used  Substance and Sexual Activity   Alcohol use: No    Alcohol/week: 0.0 standard drinks   Drug use: No   Sexual activity: Yes    Birth control/protection: Surgical  Other Topics Concern   Not on file  Social History Narrative   Lives in [place], [place] with husband.  Dialysis pt -  mwf.   Social Determinants of Health   Financial Resource Strain: Not on file  Food Insecurity: Not on file  Transportation Needs: Not on file  Physical Activity: Not on file  Stress: Not on file  Social Connections: Not on file  Intimate Partner Violence: Not on file     ROS:  General: Negative for anorexia, weight loss, fever, chills, fatigue,+ weakness. Eyes: Negative for vision changes.  ENT: Negative for hoarseness, difficulty swallowing , nasal congestion. CV: Negative for chest pain, angina, palpitations, dyspnea on exertion, peripheral edema.  Respiratory: Negative for dyspnea at rest, dyspnea on exertion, ++cough, sputum, wheezing.  GI: See history of present illness. GU:  Negative for dysuria, hematuria, urinary incontinence, urinary frequency, nocturnal urination.  MS: Negative for joint pain, low back pain.  Derm: Negative for rash or itching.  Neuro: Negative for weakness, abnormal sensation, seizure, frequent headaches, memory loss, confusion.  Psych: Negative for anxiety, depression, suicidal ideation, hallucinations.  Endo: Negative for unusual weight change.  Heme: Negative for bruising or bleeding. Allergy: Negative for rash or hives.       Physical Examination: Vital signs in last 24 hours: Temp:  [97.9 F (36.6 C)-99.8 F (37.7 C)] 97.9 F (36.6 C) (07/28 0600) Pulse Rate:  [58-94] 66 (07/28 0600) Resp:  [16-35] 19 (07/28 0600) BP: (153-234)/(48-86) 183/57 (07/28 0600) SpO2:  [81 %-100 %] 97 % (07/28 0600)    General: Well-nourished, well-developed in no acute distress. Intermittent nonproductive cough noted during exam.  Head: Normocephalic, atraumatic.   Eyes: Conjunctiva pink, no icterus. Mouth: Oropharyngeal mucosa moist and pink , no lesions erythema or exudate. Neck: Supple without thyromegaly, masses, or lymphadenopathy.  Lungs: Clear to auscultation bilaterally.  Heart: Regular rate and rhythm, no murmurs rubs or gallops.  Abdomen: Bowel  sounds are normal, epigastric and suprapubic tenderness, no hepatosplenomegaly or masses, no abdominal bruits or    hernia , no rebound or guarding.   Rectal: not performed Extremities: No lower extremity edema, clubbing, deformity.  Neuro: Alert and oriented x 4 , grossly normal neurologically.  Skin: Warm and dry, no rash or jaundice.   Psych: Alert and cooperative, normal mood and affect.        Intake/Output from previous day: No intake/output data recorded. Intake/Output this shift: No intake/output data recorded.  Lab Results: CBC Recent Labs  02/03/21 1430 02/04/21 0707  WBC 10.9* 6.5  HGB 8.9* 7.9*  HCT 28.6* 26.1*  MCV 120.2* 120.8*  PLT 324 295   BMET Recent Labs    02/03/21 1430 02/04/21 0707  NA 134* 156*  K 5.0 4.2  CL 95* 109  CO2 28 20*  GLUCOSE 104* 70  BUN 49* 49*  CREATININE 8.17* 7.09*  CALCIUM 9.6 6.7*   LFT Recent Labs    02/03/21 1430 02/04/21 0707  BILITOT 0.8 0.5  BILIDIR 0.2  --   IBILI 0.6  --   ALKPHOS 58 37*  AST 12* 20  ALT 11 17  PROT 6.2* 4.0*  ALBUMIN 3.2* 2.1*    Lipase No results for input(s): LIPASE in the last 72 hours.  PT/INR Recent Labs    02/03/21 1430  LABPROT 13.3  INR 1.0      Imaging Studies: DG Chest 2 View  Result Date: 01/22/2021 CLINICAL DATA:  80 year old female with chest pain. EXAM: CHEST - 2 VIEW COMPARISON:  Chest radiograph dated 12/18/2020. FINDINGS: Small left pleural effusion and left lung base linear atelectasis/scarring. Pneumonia is not excluded. Clinical correlation is recommended. The right lung is clear. No pneumothorax. Mild cardiomegaly. Median sternotomy wires and CABG vascular clips. No acute osseous pathology. Osteopenia with degenerative changes of the spine. Vascular stent noted in the region of the left axilla. IMPRESSION: Small left pleural effusion and left lung base atelectasis/scarring. Electronically Signed   By: Anner Crete M.D.   On: 01/22/2021 02:47   DG Chest  Port 1 View  Result Date: 02/03/2021 CLINICAL DATA:  Shortness of breath.  Blood in stool.  Weakness. EXAM: PORTABLE CHEST 1 VIEW COMPARISON:  01/22/2021 FINDINGS: Mild enlargement of the cardiopericardial silhouette with pulmonary venous hypertension and interstitial accentuation. No blunting of the lateral costophrenic angles. Prior CABG. Atherosclerotic calcification of the aortic arch. Coronary stents noted. Left axillary stent noted. IMPRESSION: 1. Cardiomegaly with pulmonary venous hypertension and interstitial accentuation probably from interstitial edema. 2. Aortic Atherosclerosis (ICD10-I70.0). Electronically Signed   By: Van Clines M.D.   On: 02/03/2021 19:44   US THYROID  Result Date: 01/21/2021 CLINICAL DATA:  Hypothyroidism EXAM: THYROID ULTRASOUND TECHNIQUE: Ultrasound examination of the thyroid gland and adjacent soft tissues was performed. COMPARISON:  None. FINDINGS: Parenchymal Echotexture: Mildly heterogeneous Isthmus: 0.3 cm Right lobe: 4.5 x 2.0 x 1.6 cm Left lobe: 3.9 x 1.8 x 1.7 cm _________________________________________________________ Estimated total number of nodules >/= 1 cm: 1 Number of spongiform nodules >/=  2 cm not described below (TR1): 0 Number of mixed cystic and solid nodules >/= 1.5 cm not described below (TR2): 0 _________________________________________________________ 1.2 x 1.0 x 0.5 cm spongiform nodule in the inferior left thyroid lobe does not meet criteria for FNA or imaging surveillance. IMPRESSION: No suspicious thyroid nodules. The above is in keeping with the ACR TI-RADS recommendations - J Am Coll Radiol 2017;14:587-595. Electronically Signed   By: Miachel Roux M.D.   On: 01/21/2021 13:44  [4 week]   Impression: Pleasant 80 year old female with complicated GI history including remote colon cancer status post right hemicolectomy, large colon adenoma removed piecemeal August 2020, small duodenal tubular adenoma removed via EGD March 2021, numerous GI  AVMs as previously outlined, CAD, end-stage renal disease on hemodialysis, A. fib presenting for further evaluation of blood in the stool.  GI bleeding: Extensive history with multiple procedures as outlined above.  Noted several bloody stools at home, described as dark blood with clots.  Some mild nondescriptive  abdominal pain accompanying her bleeding.  Denies any significant symptoms last week.  Denies NSAIDs, aspirin, blood thinners.  Typical hemoglobin can range between 7 and 9 over the past several months.  Presenting hemoglobin of 8.9 down to 7.9 this morning.  Elevated BUN in the setting of renal failure.  On rectal exam noted to have black heme positive stool.  She is at risk of bleeding from AVMs anywhere along her GI tract, cannot exclude proximal colonic source from AVM or diverticular.  Given noted melena, would consider upper GI source and normal for EGD.  COVID: Incidentally noted SARS coronavirus 2 positive.  Chest x-ray with some interstitial edema but no pneumonia.  Patient reports nonproductive cough off and on.  Denies shortness of breath.  Management per attending.   Plan: EGD to evaluate melena, plan for 7/29 as she is going to have hemodialysis today and with interstitial edema.  Clear liquids. NPO after midnight.  PPI BID. Protonix causes rash, on omeprazole at home. To discuss options with pharmacy.  Monitor for overt bleeding, transfuse as needed.   We would like to thank you for the opportunity to participate in the care of [place].  Laureen Ochs. Bernarda Caffey [facility] [phone] 7/28/202212:10 PM    LOS: 1 day

## 2021-02-04 NOTE — Progress Notes (Signed)
PROGRESS NOTE    Shawna [name]  YWV:[id number] DOB: 06-Jan-1941 DOA: 02/03/2021 PCP: [facility]   Brief Narrative:    Shawna [name] is a 80 y.o. female with medical history significant for chronic diastolic heart failure, ESRD on HD (MWF) at [facility] in [place], hypertension, hyperlipidemia, CAD s/p CABG, hypothyroidism, GERD who presents to the emergency department accompanied by her husband due to 2-day onset of blood in stool.  She also complained of chest and back soreness which resulted in her coming to the ED this morning.  Patient was admitted with mild acute hypoxemic respiratory failure secondary to fluid overload as well as possible COVID-19 pneumonia.  She was also admitted for GI bleed evaluation thought to be related to diverticulosis.  Assessment & Plan:   Principal Problem:   Acute respiratory failure with hypoxia (HCC) Active Problems:   ESRD on hemodialysis (HCC)   GERD (gastroesophageal reflux disease)   Chronic diastolic CHF (congestive heart failure) (HCC)   Essential hypertension   Hypertensive urgency   Coronary artery disease   GI bleed   COVID-19 virus infection   Leukocytosis   Elevated MCV   Hypoalbuminemia due to protein-calorie malnutrition (HCC)   Hypothyroidism (acquired)  Acute hypoxemic respiratory failure-possibly multifactorial -Noted to have COVID-19 infection that is likely incidental -Volume overload in the setting of ESRD on HD with missed hemodialysis 7/27 -Wean as tolerated while on treatments as noted below  Incidental COVID-19 virus infection -Continue treatment with remdesivir as ordered as well as isolation precautions -Monitor inflammatory markers -Patient has had COVID vaccines and booster  GI bleed -Noted to have prior diverticulosis on colonoscopy 02/2019 -Hemoccult positive -Change heparin to SCDs -Appreciate GI evaluation -Clear liquid diet initiated  Essential hypertension -Blood pressure should improve after  hemodialysis -Continue Lopressor and Imdur  History of CAD/chronic diastolic CHF -Continue home medications of Lopressor, Lipitor, nitroglycerin, and Imdur -No signs of overt CHF currently  Hypothyroidism -Continue Synthroid    DVT prophylaxis: SCDs Code Status: Full Family Communication: Patient will call, none at bedside Disposition Plan:  Status is: Inpatient  Dispo: The patient is from: Home              Anticipated d/c is to: Home              Patient currently is not medically stable to d/c.   Difficult to place patient No  Consultants:  Nephrology GI  Procedures:  See below  Antimicrobials:  Anti-infectives (From admission, onward)    Start     Dose/Rate Route Frequency Ordered Stop   02/04/21 1000  remdesivir 100 mg in sodium chloride 0.9 % 100 mL IVPB  Status:  Discontinued       See Hyperspace for full Linked Orders Report.   100 mg 200 mL/hr over 30 Minutes Intravenous Daily 02/03/21 2038 02/03/21 2055   02/04/21 1000  remdesivir 100 mg in sodium chloride 0.9 % 100 mL IVPB  Status:  Discontinued       See Hyperspace for full Linked Orders Report.   100 mg 200 mL/hr over 30 Minutes Intravenous Daily 02/03/21 2055 02/03/21 2059   02/04/21 1000  remdesivir 100 mg in sodium chloride 0.9 % 100 mL IVPB        100 mg 200 mL/hr over 30 Minutes Intravenous Daily 02/03/21 2100 02/08/21 0959   02/03/21 2100  remdesivir 200 mg in sodium chloride 0.9% 250 mL IVPB  Status:  Discontinued       See Hyperspace  for full Linked Orders Report.   100 mg 540 mL/hr over 30 Minutes Intravenous Every 30 min 02/03/21 2055 02/03/21 2059   02/03/21 2100  remdesivir 100 mg in sodium chloride 0.9 % 100 mL IVPB        100 mg 200 mL/hr over 30 Minutes Intravenous Every 30 min 02/03/21 2059 02/03/21 2200   02/03/21 2045  remdesivir 200 mg in sodium chloride 0.9% 250 mL IVPB  Status:  Discontinued       See Hyperspace for full Linked Orders Report.   200 mg 580 mL/hr over 30 Minutes  Intravenous Once 02/03/21 2038 02/03/21 2055       Subjective: Patient seen and evaluated today with no new acute complaints or concerns. No acute concerns or events noted overnight.  Currently resting comfortably on nasal cannula oxygen.  Objective: Vitals:   02/04/21 0500 02/04/21 0600 02/04/21 1115 02/04/21 1132  BP: (!) 175/59 (!) 183/57 (!) 177/103 (!) 174/57  Pulse:  66 61 (!) 59  Resp: 20 19 (!) 23 19  Temp:  97.9 F (36.6 C)    TempSrc:  Oral    SpO2:  97% 99% 100%   No intake or output data in the 24 hours ending 02/04/21 1156 There were no vitals filed for this visit.  Examination:  General exam: Appears calm and comfortable  Respiratory system: Clear to auscultation. Respiratory effort normal.  Currently on 2 L nasal cannula oxygen. Cardiovascular system: S1 & S2 heard, RRR.  Gastrointestinal system: Abdomen is soft Central nervous system: Alert and awake Extremities: No edema Skin: No significant lesions noted Psychiatry: Flat affect.    Data Reviewed: I have personally reviewed following labs and imaging studies  CBC: Recent Labs  Lab 02/03/21 1430 02/04/21 0707  WBC 10.9* 6.5  NEUTROABS 9.5* 4.8  HGB 8.9* 7.9*  HCT 28.6* 26.1*  MCV 120.2* 120.8*  PLT 324 366   Basic Metabolic Panel: Recent Labs  Lab 02/03/21 1430 02/04/21 0707  NA 134* 156*  K 5.0 4.2  CL 95* 109  CO2 28 20*  GLUCOSE 104* 70  BUN 49* 49*  CREATININE 8.17* 7.09*  CALCIUM 9.6 6.7*  MG  --  1.6*  PHOS  --  3.6   GFR: Estimated Creatinine Clearance: 5 mL/min (A) (by C-G formula based on SCr of 7.09 mg/dL (H)). Liver Function Tests: Recent Labs  Lab 02/03/21 1430 02/04/21 0707  AST 12* 20  ALT 11 17  ALKPHOS 58 37*  BILITOT 0.8 0.5  PROT 6.2* 4.0*  ALBUMIN 3.2* 2.1*   No results for input(s): LIPASE, AMYLASE in the last 168 hours. No results for input(s): AMMONIA in the last 168 hours. Coagulation Profile: Recent Labs  Lab 02/03/21 1430  INR 1.0   Cardiac  Enzymes: No results for input(s): CKTOTAL, CKMB, CKMBINDEX, TROPONINI in the last 168 hours. BNP (last 3 results) No results for input(s): PROBNP in the last 8760 hours. HbA1C: No results for input(s): HGBA1C in the last 72 hours. CBG: No results for input(s): GLUCAP in the last 168 hours. Lipid Profile: No results for input(s): CHOL, HDL, LDLCALC, TRIG, CHOLHDL, LDLDIRECT in the last 72 hours. Thyroid Function Tests: No results for input(s): TSH, T4TOTAL, FREET4, T3FREE, THYROIDAB in the last 72 hours. Anemia Panel: Recent Labs    02/04/21 0708  VITAMINB12 381  FOLATE 12.8  FERRITIN 930*   Sepsis Labs: No results for input(s): PROCALCITON, LATICACIDVEN in the last 168 hours.  Recent Results (from the past 240 hour(s))  Resp Panel by RT-PCR (Flu A&B, Covid) Nasopharyngeal Swab     Status: Abnormal   Collection Time: 02/03/21  7:25 PM   Specimen: Nasopharyngeal Swab; Nasopharyngeal(NP) swabs in vial transport medium  Result Value Ref Range Status   SARS Coronavirus 2 by RT PCR POSITIVE (A) NEGATIVE Final    Comment: CRITICAL RESULT CALLED TO, READ BACK BY AND VERIFIED WITH: GESELL,K 2011 02/03/2021 COLEMAN,R (NOTE) SARS-CoV-2 target nucleic acids are DETECTED.  The SARS-CoV-2 RNA is generally detectable in upper respiratory specimens during the acute phase of infection. Positive results are indicative of the presence of the identified virus, but do not rule out bacterial infection or co-infection with other pathogens not detected by the test. Clinical correlation with patient history and other diagnostic information is necessary to determine patient infection status. The expected result is Negative.  Fact Sheet for Patients: EntrepreneurPulse.com.au  Fact Sheet for Healthcare Providers: IncredibleEmployment.be  This test is not yet approved or cleared by the [place] FDA and  has been authorized for detection and/or diagnosis of  SARS-CoV-2 by FDA under an Emergency Use Authorization (EUA).  This EUA will remain in effect (meaning this t est can be used) for the duration of  the COVID-19 declaration under Section 564(b)(1) of the Act, 21 U.S.C. section 360bbb-3(b)(1), unless the authorization is terminated or revoked sooner.     Influenza A by PCR NEGATIVE NEGATIVE Final   Influenza B by PCR NEGATIVE NEGATIVE Final    Comment: (NOTE) The Xpert Xpress SARS-CoV-2/FLU/RSV plus assay is intended as an aid in the diagnosis of influenza from Nasopharyngeal swab specimens and should not be used as a sole basis for treatment. Nasal washings and aspirates are unacceptable for Xpert Xpress SARS-CoV-2/FLU/RSV testing.  Fact Sheet for Patients: EntrepreneurPulse.com.au  Fact Sheet for Healthcare Providers: IncredibleEmployment.be  This test is not yet approved or cleared by the [place] FDA and has been authorized for detection and/or diagnosis of SARS-CoV-2 by FDA under an Emergency Use Authorization (EUA). This EUA will remain in effect (meaning this test can be used) for the duration of the COVID-19 declaration under Section 564(b)(1) of the Act, 21 U.S.C. section 360bbb-3(b)(1), unless the authorization is terminated or revoked.  Performed at [facility], [address], [place], [place] [address]          Radiology Studies: [facility] Chest [facility] 1 View  Result Date: 02/03/2021 CLINICAL DATA:  Shortness of breath.  Blood in stool.  Weakness. EXAM: PORTABLE CHEST 1 VIEW COMPARISON:  01/22/2021 FINDINGS: Mild enlargement of the cardiopericardial silhouette with pulmonary venous hypertension and interstitial accentuation. No blunting of the lateral costophrenic angles. Prior CABG. Atherosclerotic calcification of the aortic arch. Coronary stents noted. Left axillary stent noted. IMPRESSION: 1. Cardiomegaly with pulmonary venous hypertension and interstitial accentuation probably  from interstitial edema. 2. Aortic Atherosclerosis (ICD10-I70.0). Electronically Signed   By: Van Clines M.D.   On: 02/03/2021 19:44        Scheduled Meds:  vitamin C  500 mg Oral Daily   atorvastatin  10 mg Oral Daily   Chlorhexidine Gluconate Cloth  6 each Topical Q0600   [START ON 02/05/2021] darbepoetin (ARANESP) injection - DIALYSIS  150 mcg Intravenous Q Fri-HD   levothyroxine  25 mcg Oral Q0600   metoprolol tartrate  25 mg Oral BID   sevelamer carbonate  1,600 mg Oral TID WC   zinc sulfate  220 mg Oral Daily   Continuous Infusions:  famotidine (PEPCID) IV Stopped (02/03/21 2142)   remdesivir  100 mg in NS 100 mL 100 mg (02/04/21 1117)     LOS: 1 day    Time spent: 35 minutes    Crixus Mcaulay D Manuella Ghazi, DO [facility]  If 7PM-7AM, please contact night-coverage [email or web address] 02/04/2021, 11:56 AM

## 2021-02-04 NOTE — ED Notes (Signed)
Bag of personal belongings taken to room 336. Report given at this time. Patient in room, taken by dialysis nurse.

## 2021-02-05 ENCOUNTER — Encounter (HOSPITAL_COMMUNITY): Payer: Self-pay | Admitting: Internal Medicine

## 2021-02-05 ENCOUNTER — Inpatient Hospital Stay (HOSPITAL_COMMUNITY): Payer: Medicare HMO | Admitting: Certified Registered Nurse Anesthetist

## 2021-02-05 ENCOUNTER — Encounter (HOSPITAL_COMMUNITY)
Admission: EM | Disposition: A | Discharge status: Home Health | Payer: Self-pay | Source: Home / Self Care | Attending: Internal Medicine

## 2021-02-05 DIAGNOSIS — K2970 Gastritis, unspecified, without bleeding: Secondary | ICD-10-CM | POA: Diagnosis not present

## 2021-02-05 DIAGNOSIS — B3781 Candidal esophagitis: Secondary | ICD-10-CM

## 2021-02-05 DIAGNOSIS — J9601 Acute respiratory failure with hypoxia: Secondary | ICD-10-CM | POA: Diagnosis not present

## 2021-02-05 HISTORY — PX: ESOPHAGOGASTRODUODENOSCOPY (EGD) WITH PROPOFOL: SHX5813

## 2021-02-05 LAB — CBC WITH DIFFERENTIAL/PLATELET
Band Neutrophils: 13 %
Basophils Absolute: 0 10*3/uL (ref 0.0–0.1)
Basophils Relative: 0 %
Eosinophils Absolute: 0 10*3/uL (ref 0.0–0.5)
Eosinophils Relative: 0 %
HCT: 25.1 % — ABNORMAL LOW (ref 36.0–46.0)
Hemoglobin: 7.6 g/dL — ABNORMAL LOW (ref 12.0–15.0)
Lymphocytes Relative: 6 %
Lymphs Abs: 0.3 10*3/uL — ABNORMAL LOW (ref 0.7–4.0)
MCH: 36.5 pg — ABNORMAL HIGH (ref 26.0–34.0)
MCHC: 30.3 g/dL (ref 30.0–36.0)
MCV: 120.7 fL — ABNORMAL HIGH (ref 80.0–100.0)
Monocytes Absolute: 0.5 10*3/uL (ref 0.1–1.0)
Monocytes Relative: 10 %
Neutro Abs: 4.3 10*3/uL (ref 1.7–7.7)
Neutrophils Relative %: 71 %
Platelets: 257 10*3/uL (ref 150–400)
RBC: 2.08 MIL/uL — ABNORMAL LOW (ref 3.87–5.11)
RDW: 19.4 % — ABNORMAL HIGH (ref 11.5–15.5)
WBC: 5.1 10*3/uL (ref 4.0–10.5)
nRBC: 0 % (ref 0.0–0.2)

## 2021-02-05 LAB — COMPREHENSIVE METABOLIC PANEL
ALT: 72 U/L — ABNORMAL HIGH (ref 0–44)
AST: 92 U/L — ABNORMAL HIGH (ref 15–41)
Albumin: 2.5 g/dL — ABNORMAL LOW (ref 3.5–5.0)
Alkaline Phosphatase: 46 U/L (ref 38–126)
Anion gap: 12 (ref 5–15)
BUN: 35 mg/dL — ABNORMAL HIGH (ref 8–23)
CO2: 28 mmol/L (ref 22–32)
Calcium: 8.2 mg/dL — ABNORMAL LOW (ref 8.9–10.3)
Chloride: 97 mmol/L — ABNORMAL LOW (ref 98–111)
Creatinine, Ser: 5.94 mg/dL — ABNORMAL HIGH (ref 0.44–1.00)
GFR, Estimated: 7 mL/min — ABNORMAL LOW (ref >60)
Glucose, Bld: 69 mg/dL — ABNORMAL LOW (ref 70–99)
Potassium: 4 mmol/L (ref 3.5–5.1)
Sodium: 137 mmol/L (ref 135–145)
Total Bilirubin: 0.7 mg/dL (ref 0.3–1.2)
Total Protein: 4.9 g/dL — ABNORMAL LOW (ref 6.5–8.1)

## 2021-02-05 LAB — D-DIMER, QUANTITATIVE: D-Dimer, Quant: 0.51 ug/mL-FEU — ABNORMAL HIGH (ref 0.00–0.50)

## 2021-02-05 LAB — PHOSPHORUS: Phosphorus: 5.4 mg/dL — ABNORMAL HIGH (ref 2.5–4.6)

## 2021-02-05 LAB — MAGNESIUM: Magnesium: 1.9 mg/dL (ref 1.7–2.4)

## 2021-02-05 LAB — C-REACTIVE PROTEIN: CRP: 5.4 mg/dL — ABNORMAL HIGH (ref <1.0)

## 2021-02-05 LAB — FERRITIN: Ferritin: 1462 ng/mL — ABNORMAL HIGH (ref 11–307)

## 2021-02-05 SURGERY — ESOPHAGOGASTRODUODENOSCOPY (EGD) WITH PROPOFOL
Anesthesia: General

## 2021-02-05 MED ORDER — SODIUM CHLORIDE 0.9 % IV SOLN: INTRAVENOUS | Status: DC

## 2021-02-05 MED ORDER — DARBEPOETIN ALFA 150 MCG/0.3ML IJ SOSY
PREFILLED_SYRINGE | INTRAMUSCULAR | Status: AC
  Administered 2021-02-05: 150 ug via INTRAVENOUS
  Filled 2021-02-05: qty 0.3

## 2021-02-05 MED ORDER — FLUCONAZOLE 100 MG PO TABS
200.0000 mg | ORAL_TABLET | Freq: Once | ORAL | Status: AC
  Administered 2021-02-05: 200 mg via ORAL
  Filled 2021-02-05: qty 2
  Filled 2021-02-05: qty 1

## 2021-02-05 MED ORDER — FLUCONAZOLE 100 MG PO TABS
100.0000 mg | ORAL_TABLET | Freq: Every day | ORAL | Status: DC
  Administered 2021-02-06 – 2021-02-07 (×2): 100 mg via ORAL
  Filled 2021-02-05 (×2): qty 1

## 2021-02-05 MED ORDER — LIDOCAINE HCL (CARDIAC) PF 100 MG/5ML IV SOSY
PREFILLED_SYRINGE | INTRAVENOUS | Status: DC | PRN
  Administered 2021-02-05: 50 mg via INTRAVENOUS

## 2021-02-05 MED ORDER — LACTATED RINGERS IV SOLN: INTRAVENOUS | Status: DC

## 2021-02-05 MED ORDER — FAMOTIDINE IN NACL 20-0.9 MG/50ML-% IV SOLN
20.0000 mg | INTRAVENOUS | Status: DC
  Administered 2021-02-05: 20 mg via INTRAVENOUS
  Filled 2021-02-05: qty 50

## 2021-02-05 MED ORDER — PHENYLEPHRINE HCL (PRESSORS) 10 MG/ML IV SOLN
INTRAVENOUS | Status: DC | PRN
  Administered 2021-02-05: 120 ug via INTRAVENOUS

## 2021-02-05 MED ORDER — PROPOFOL 10 MG/ML IV BOLUS
INTRAVENOUS | Status: DC | PRN
  Administered 2021-02-05: 70 mg via INTRAVENOUS

## 2021-02-05 NOTE — Plan of Care (Signed)

## 2021-02-05 NOTE — Care Management Important Message (Signed)
Important Message  Patient Details  Name: Shawna [name] MRN: [id number] Date of Birth: May 17, 1941   Medicare Important Message Given:  Yes - Important Message mailed due to current National Emergency     [name] 02/05/2021, 11:44 AM

## 2021-02-05 NOTE — Anesthesia Preprocedure Evaluation (Signed)
Anesthesia Evaluation  Patient identified by MRN, date of birth, ID band Patient awake    Reviewed: Allergy & Precautions, H&P , NPO status , Patient's Chart, lab work & pertinent test results, reviewed documented beta blocker date and time   Airway Mallampati: II  TM Distance: >3 FB Neck ROM: full    Dental no notable dental hx.    Pulmonary pneumonia,    Pulmonary exam normal breath sounds clear to auscultation       Cardiovascular Exercise Tolerance: Good hypertension, + angina + CAD, + Past MI, +CHF and + DOE   Rhythm:regular Rate:Normal     Neuro/Psych PSYCHIATRIC DISORDERS Anxiety TIA Neuromuscular disease    GI/Hepatic Neg liver ROS, PUD, GERD  Medicated,  Endo/Other  diabetes, Type 2Hypothyroidism   Renal/GU ESRF and DialysisRenal disease  negative genitourinary   Musculoskeletal   Abdominal   Peds  Hematology  (+) Blood dyscrasia, anemia ,   Anesthesia Other Findings   Reproductive/Obstetrics negative OB ROS                             Anesthesia Physical Anesthesia Plan  ASA: 4 and emergent  Anesthesia Plan: General   Post-op Pain Management:    Induction:   PONV Risk Score and Plan: Propofol infusion  Airway Management Planned:   Additional Equipment:   Intra-op Plan:   Post-operative Plan:   Informed Consent: I have reviewed the patients History and Physical, chart, labs and discussed the procedure including the risks, benefits and alternatives for the proposed anesthesia with the patient or authorized representative who has indicated his/her understanding and acceptance.     Dental Advisory Given  Plan Discussed with: CRNA  Anesthesia Plan Comments:         Anesthesia Quick Evaluation

## 2021-02-05 NOTE — Progress Notes (Signed)
PROGRESS NOTE    [name]  TML:[id number] DOB: 02/12/1941 DOA: 02/03/2021 PCP: [facility]   Brief Narrative:   Shawna [name] is a 80 y.o. female with medical history significant for chronic diastolic heart failure, ESRD on HD (MWF) at [facility] in [place], hypertension, hyperlipidemia, CAD s/p CABG, hypothyroidism, GERD who presents to the emergency department accompanied by her husband due to 2-day onset of blood in stool.  She also complained of chest and back soreness which resulted in her coming to the ED this morning.  Patient was admitted with mild acute hypoxemic respiratory failure secondary to fluid overload as well as possible COVID-19 pneumonia.  She was also admitted for GI bleed evaluation thought to be related to diverticulosis.  Plan is for EGD today and then hemodialysis tomorrow morning.  Assessment & Plan:   Principal Problem:   Acute respiratory failure with hypoxia (HCC) Active Problems:   ESRD on hemodialysis (HCC)   GERD (gastroesophageal reflux disease)   Acute post-hemorrhagic anemia   Chronic diastolic CHF (congestive heart failure) (HCC)   Essential hypertension   Hypertensive urgency   Coronary artery disease   GI bleed   COVID-19 virus infection   Leukocytosis   Elevated MCV   Hypoalbuminemia due to protein-calorie malnutrition (HCC)   Hypothyroidism (acquired)   Acute hypoxemic respiratory failure-possibly multifactorial -Noted to have COVID-19 infection that is likely incidental -Volume overload in the setting of ESRD on HD with missed hemodialysis 7/27 -Wean as tolerated while on treatments as noted below -Plan to wean oxygen today   Incidental COVID-19 virus infection -Continue treatment with remdesivir as ordered as well as isolation precautions -Monitor inflammatory markers -Patient has had COVID vaccines and booster -Wean oxygen   GI bleed -Downtrending hemoglobin level noted -Noted to have prior diverticulosis on  colonoscopy 02/2019 -Hemoccult positive -Continue SCDs -Appreciate GI evaluation -Currently n.p.o. in anticipation of EGD   Essential hypertension -Blood pressure should improve after hemodialysis -Continue Lopressor and Imdur   History of CAD/chronic diastolic CHF -Continue home medications of Lopressor, Lipitor, nitroglycerin, and Imdur -No signs of overt CHF currently   Hypothyroidism -Continue Synthroid  ESRD on HD MWF -Patient last underwent hemodialysis 6/81 with no complications, plan for further dialysis tomorrow     DVT prophylaxis: SCDs Code Status: Full Family Communication: Patient will call, none at bedside Disposition Plan:  Status is: Inpatient   Dispo: The patient is from: Home              Anticipated d/c is to: Home              Patient currently is not medically stable to d/c.              Difficult to place patient No   Consultants:  Nephrology GI   Procedures:  See below   Antimicrobials:  Anti-infectives (From admission, onward)    Start     Dose/Rate Route Frequency Ordered Stop   02/04/21 1000  remdesivir 100 mg in sodium chloride 0.9 % 100 mL IVPB  Status:  Discontinued       See Hyperspace for full Linked Orders Report.   100 mg 200 mL/hr over 30 Minutes Intravenous Daily 02/03/21 2038 02/03/21 2055   02/04/21 1000  remdesivir 100 mg in sodium chloride 0.9 % 100 mL IVPB  Status:  Discontinued       See Hyperspace for full Linked Orders Report.   100 mg 200 mL/hr over 30 Minutes Intravenous Daily 02/03/21  2055 02/03/21 2059   02/04/21 1000  remdesivir 100 mg in sodium chloride 0.9 % 100 mL IVPB        100 mg 200 mL/hr over 30 Minutes Intravenous Daily 02/03/21 2100 02/08/21 0959   02/03/21 2100  remdesivir 200 mg in sodium chloride 0.9% 250 mL IVPB  Status:  Discontinued       See Hyperspace for full Linked Orders Report.   100 mg 540 mL/hr over 30 Minutes Intravenous Every 30 min 02/03/21 2055 02/03/21 2059   02/03/21 2100  remdesivir  100 mg in sodium chloride 0.9 % 100 mL IVPB        100 mg 200 mL/hr over 30 Minutes Intravenous Every 30 min 02/03/21 2059 02/03/21 2200   02/03/21 2045  remdesivir 200 mg in sodium chloride 0.9% 250 mL IVPB  Status:  Discontinued       See Hyperspace for full Linked Orders Report.   200 mg 580 mL/hr over 30 Minutes Intravenous Once 02/03/21 2038 02/03/21 2055       Subjective: Patient seen and evaluated today with no new acute complaints or concerns. No acute concerns or events noted overnight.  Objective: Vitals:   02/04/21 1800 02/04/21 2158 02/05/21 0159 02/05/21 0611  BP: (!) 151/62 (!) 151/62 (!) 144/33 (!) 144/33  Pulse: 71 72 71   Resp: 20 20 20 20   Temp:  100.1 F (37.8 C) 99.9 F (37.7 C) 100 F (37.8 C)  TempSrc:  Oral Oral Oral  SpO2: 100% 100% 100% 100%  Weight:      Height: 5\' 5"  (1.651 m)       Intake/Output Summary (Last 24 hours) at 02/05/2021 1234 Last data filed at 02/05/2021 0900 Gross per 24 hour  Intake 0 ml  Output 1311 ml  Net -1311 ml   Filed Weights   02/04/21 1340  Weight: 58.7 kg    Examination:  General exam: Appears calm and comfortable  Respiratory system: Clear to auscultation. Respiratory effort normal.  Currently on 1.5 L nasal cannula oxygen Cardiovascular system: S1 & S2 heard, RRR.  Gastrointestinal system: Abdomen is soft Central nervous system: Alert and awake Extremities: No edema Skin: No significant lesions noted Psychiatry: Flat affect.    Data Reviewed: I have personally reviewed following labs and imaging studies  CBC: Recent Labs  Lab 02/03/21 1430 02/04/21 0707 02/04/21 1420 02/05/21 0841  WBC 10.9* 6.5 7.5 5.1  NEUTROABS 9.5* 4.8  --  4.3  HGB 8.9* 7.9* 8.1* 7.6*  HCT 28.6* 26.1* 26.2* 25.1*  MCV 120.2* 120.8* 120.2* 120.7*  PLT 324 295 308 782   Basic Metabolic Panel: Recent Labs  Lab 02/03/21 1430 02/04/21 0707 02/04/21 1420 02/05/21 0841  NA 134* 156* 134* 137  K 5.0 4.2 5.9* 4.0  CL 95*  109 95* 97*  CO2 28 20* 23 28  GLUCOSE 104* 70 112* 69*  BUN 49* 49* 67* 35*  CREATININE 8.17* 7.09* 10.06* 5.94*  CALCIUM 9.6 6.7* 8.7* 8.2*  MG  --  1.6*  --  1.9  PHOS  --  3.6 6.1* 5.4*   GFR: Estimated Creatinine Clearance: 6.8 mL/min (A) (by C-G formula based on SCr of 5.94 mg/dL (H)). Liver Function Tests: Recent Labs  Lab 02/03/21 1430 02/04/21 0707 02/04/21 1420 02/05/21 0841  AST 12* 20  --  92*  ALT 11 17  --  72*  ALKPHOS 58 37*  --  46  BILITOT 0.8 0.5  --  0.7  PROT 6.2* 4.0*  --  4.9*  ALBUMIN 3.2* 2.1* 3.0* 2.5*   No results for input(s): LIPASE, AMYLASE in the last 168 hours. No results for input(s): AMMONIA in the last 168 hours. Coagulation Profile: Recent Labs  Lab 02/03/21 1430  INR 1.0   Cardiac Enzymes: No results for input(s): CKTOTAL, CKMB, CKMBINDEX, TROPONINI in the last 168 hours. BNP (last 3 results) No results for input(s): PROBNP in the last 8760 hours. HbA1C: No results for input(s): HGBA1C in the last 72 hours. CBG: No results for input(s): GLUCAP in the last 168 hours. Lipid Profile: No results for input(s): CHOL, HDL, LDLCALC, TRIG, CHOLHDL, LDLDIRECT in the last 72 hours. Thyroid Function Tests: No results for input(s): TSH, T4TOTAL, FREET4, T3FREE, THYROIDAB in the last 72 hours. Anemia Panel: Recent Labs    02/04/21 0708 02/05/21 0841  VITAMINB12 381  --   FOLATE 12.8  --   FERRITIN 930* 1,462*   Sepsis Labs: No results for input(s): PROCALCITON, LATICACIDVEN in the last 168 hours.  Recent Results (from the past 240 hour(s))  Resp Panel by RT-PCR (Flu A&B, Covid) Nasopharyngeal Swab     Status: Abnormal   Collection Time: 02/03/21  7:25 PM   Specimen: Nasopharyngeal Swab; Nasopharyngeal(NP) swabs in vial transport medium  Result Value Ref Range Status   SARS Coronavirus 2 by RT PCR POSITIVE (A) NEGATIVE Final    Comment: CRITICAL RESULT CALLED TO, READ BACK BY AND VERIFIED WITH: GESELL,K 2011 02/03/2021  COLEMAN,R (NOTE) SARS-CoV-2 target nucleic acids are DETECTED.  The SARS-CoV-2 RNA is generally detectable in upper respiratory specimens during the acute phase of infection. Positive results are indicative of the presence of the identified virus, but do not rule out bacterial infection or co-infection with other pathogens not detected by the test. Clinical correlation with patient history and other diagnostic information is necessary to determine patient infection status. The expected result is Negative.  Fact Sheet for Patients: EntrepreneurPulse.com.au  Fact Sheet for Healthcare Providers: IncredibleEmployment.be  This test is not yet approved or cleared by the [place] FDA and  has been authorized for detection and/or diagnosis of SARS-CoV-2 by FDA under an Emergency Use Authorization (EUA).  This EUA will remain in effect (meaning this t est can be used) for the duration of  the COVID-19 declaration under Section 564(b)(1) of the Act, 21 U.S.C. section 360bbb-3(b)(1), unless the authorization is terminated or revoked sooner.     Influenza A by PCR NEGATIVE NEGATIVE Final   Influenza B by PCR NEGATIVE NEGATIVE Final    Comment: (NOTE) The Xpert Xpress SARS-CoV-2/FLU/RSV plus assay is intended as an aid in the diagnosis of influenza from Nasopharyngeal swab specimens and should not be used as a sole basis for treatment. Nasal washings and aspirates are unacceptable for Xpert Xpress SARS-CoV-2/FLU/RSV testing.  Fact Sheet for Patients: EntrepreneurPulse.com.au  Fact Sheet for Healthcare Providers: IncredibleEmployment.be  This test is not yet approved or cleared by the [place] FDA and has been authorized for detection and/or diagnosis of SARS-CoV-2 by FDA under an Emergency Use Authorization (EUA). This EUA will remain in effect (meaning this test can be used) for the duration of  the COVID-19 declaration under Section 564(b)(1) of the Act, 21 U.S.C. section 360bbb-3(b)(1), unless the authorization is terminated or revoked.  Performed at [facility], [address] [place], [place] [address]          Radiology Studies: [facility] 1 View  Result Date: 02/03/2021 CLINICAL DATA:  Shortness of breath.  Blood in stool.  Weakness. EXAM: PORTABLE CHEST 1 VIEW COMPARISON:  01/22/2021 FINDINGS: Mild enlargement of the cardiopericardial silhouette with pulmonary venous hypertension and interstitial accentuation. No blunting of the lateral costophrenic angles. Prior CABG. Atherosclerotic calcification of the aortic arch. Coronary stents noted. Left axillary stent noted. IMPRESSION: 1. Cardiomegaly with pulmonary venous hypertension and interstitial accentuation probably from interstitial edema. 2. Aortic Atherosclerosis (ICD10-I70.0). Electronically Signed   By: Van Clines M.D.   On: 02/03/2021 19:44        Scheduled Meds:  vitamin C  500 mg Oral Daily   atorvastatin  10 mg Oral Daily   Chlorhexidine Gluconate Cloth  6 each Topical Q0600   darbepoetin (ARANESP) injection - DIALYSIS  150 mcg Intravenous Q Fri-HD   levothyroxine  25 mcg Oral Q0600   metoprolol tartrate  25 mg Oral BID   omeprazole  20 mg Oral BID AC   sevelamer carbonate  1,600 mg Oral TID WC   zinc sulfate  220 mg Oral Daily   Continuous Infusions:  sodium chloride     sodium chloride     famotidine (PEPCID) IV     remdesivir 100 mg in NS 100 mL Stopped (02/04/21 1205)     LOS: 2 days    Time spent: 35 minutes    Jozette Castrellon D Manuella Ghazi, DO [facility]  If 7PM-7AM, please contact night-coverage [email or web address] 02/05/2021, 12:34 PM

## 2021-02-05 NOTE — Progress Notes (Addendum)
[facility] Progress Note    Assessment/ Plan:   ESRD:  -outpatient orders: [facility]  on MWF . EDW 61kg HD Bath 2K/2.5Ca  Time 3.5 hrs Heparin none. Access LUE AVG Nipro Cellentia 17h Blood flow 300 DFR 500     -planning for HD tomorrow given inpatient census, tentatively planning to get her back on her MWF schedule thereafter   AHRF, pulmonary edema-improved, back on RA -UF as tolerated today   COVID-19 positive -per primary service -will need covid chair at her HD unit prior to HD   Volume/ hypertension: EDW 55.5kg. Attempt to achieve EDW as tolerated   Anemia of Chronic Kidney Disease, GIB: Hemoglobin down to 7.9. Currently receiving epogen 14000 units qtreatment. aranesp ordered -h/o diverticulosis, GI on board -no heparin w/ HD   Secondary Hyperparathyroidism/Hyperphosphatemia: resume home binders    Vascular access: LUE AVG   Additional recommendations: - Dose all meds for creatinine clearance < 10 ml/min  - Unless absolutely necessary, no MRIs with gadolinium.  - Implement save arm precautions.  Prefer needle sticks in the dorsum of the hands or wrists.  No blood pressure measurements in arm. - If blood transfusion is requested during hemodialysis sessions, please alert [place] prior to the session. - If a hemodialysis catheter line culture is requested, please alert [place] as only hemodialysis nurses are able to collect those specimens.  Discussed w/ primary service. Will likely not be seen over the weekend, please call with questions/concerns. Gean Quint, MD [facility]  Subjective:   Tolerated HD yesterday, had hypotension during treatment. Weaned off o2 during treatment, sat'ing 95% on ra, now 100% on RA per flow sheet. Net uf 1.3L. no acute per chart review. Patient not examined today in the context of COVID-19 positive and to limit exposure. Chart reviewed, coordinated with staff and primary service.   Objective:   BP (!) 144/33 (BP  Location: Right Wrist)   Pulse 71   Temp 100 F (37.8 C) (Oral)   Resp 20   Ht 5\' 5"  (1.651 m)   Wt 58.7 kg   SpO2 100%   BMI 21.53 kg/m   Intake/Output Summary (Last 24 hours) at 02/05/2021 1048 Last data filed at 02/04/2021 1715 Gross per 24 hour  Intake 99.27 ml  Output 1311 ml  Net -1211.73 ml   Weight change:   Physical Exam: Not examined today-COVID+, limiting exposure  Imaging: DG Chest Port 1 View  Result Date: 02/03/2021 CLINICAL DATA:  Shortness of breath.  Blood in stool.  Weakness. EXAM: PORTABLE CHEST 1 VIEW COMPARISON:  01/22/2021 FINDINGS: Mild enlargement of the cardiopericardial silhouette with pulmonary venous hypertension and interstitial accentuation. No blunting of the lateral costophrenic angles. Prior CABG. Atherosclerotic calcification of the aortic arch. Coronary stents noted. Left axillary stent noted. IMPRESSION: 1. Cardiomegaly with pulmonary venous hypertension and interstitial accentuation probably from interstitial edema. 2. Aortic Atherosclerosis (ICD10-I70.0). Electronically Signed   By: Van Clines M.D.   On: 02/03/2021 19:44    Labs: BMET Recent Labs  Lab 02/03/21 1430 02/04/21 0707 02/04/21 1420 02/05/21 0841  NA 134* 156* 134* 137  K 5.0 4.2 5.9* 4.0  CL 95* 109 95* 97*  CO2 28 20* 23 28  GLUCOSE 104* 70 112* 69*  BUN 49* 49* 67* 35*  CREATININE 8.17* 7.09* 10.06* 5.94*  CALCIUM 9.6 6.7* 8.7* 8.2*  PHOS  --  3.6 6.1* 5.4*   CBC Recent Labs  Lab 02/03/21 1430 02/04/21 0707 02/04/21 1420 02/05/21 0841  WBC 10.9* 6.5 7.5 5.1  NEUTROABS 9.5* 4.8  --  4.3  HGB 8.9* 7.9* 8.1* 7.6*  HCT 28.6* 26.1* 26.2* 25.1*  MCV 120.2* 120.8* 120.2* 120.7*  PLT 324 295 308 257    Medications:     vitamin C  500 mg Oral Daily   atorvastatin  10 mg Oral Daily   Chlorhexidine Gluconate Cloth  6 each Topical Q0600   darbepoetin (ARANESP) injection - DIALYSIS  150 mcg Intravenous Q Fri-HD   levothyroxine  25 mcg Oral Q0600    metoprolol tartrate  25 mg Oral BID   omeprazole  20 mg Oral BID AC   sevelamer carbonate  1,600 mg Oral TID WC   zinc sulfate  220 mg Oral Daily      Gean Quint, MD [facility] 02/05/2021, 10:48 AM

## 2021-02-05 NOTE — Transfer of Care (Signed)
Immediate Anesthesia Transfer of Care Note  Patient: Shawna [name]  Procedure(s) Performed: ESOPHAGOGASTRODUODENOSCOPY (EGD) WITH PROPOFOL  Patient Location: PACU  Anesthesia Type:General  Level of Consciousness: drowsy  Airway & Oxygen Therapy: Patient Spontanous Breathing  Post-op Assessment: Report given to RN and Post -op Vital signs reviewed and stable  Post vital signs: Reviewed and stable  Last Vitals:  Vitals Value Taken Time  BP 119/40   Temp    Pulse 67   Resp    SpO2 93     Last Pain:  Vitals:   02/05/21 1402  TempSrc:   PainSc: 0-No pain         Complications: No notable events documented.

## 2021-02-05 NOTE — Procedures (Signed)
   HEMODIALYSIS TREATMENT NOTE:  HD was rescheduled for today, her regular treatment day, to cohort Covid+ patients.  3.5 hour heparin-free treatment completed.  2 liters removed without interruption in ultrafiltration.  All blood was returned and hemostasis was achieved in 15 minutes.  No changes from pre-HD assessment.  Rockwell Alexandria, RN

## 2021-02-05 NOTE — Anesthesia Postprocedure Evaluation (Signed)
Anesthesia Post Note  Patient: Shawna [name]  Procedure(s) Performed: ESOPHAGOGASTRODUODENOSCOPY (EGD) WITH PROPOFOL  Patient location during evaluation: Phase II Anesthesia Type: General Level of consciousness: awake Pain management: pain level controlled Vital Signs Assessment: post-procedure vital signs reviewed and stable Respiratory status: spontaneous breathing and respiratory function stable Cardiovascular status: blood pressure returned to baseline and stable Postop Assessment: no headache and no apparent nausea or vomiting Anesthetic complications: no Comments: Late entry   No notable events documented.   Last Vitals:  Vitals:   02/05/21 1420 02/05/21 1446  BP: (!) 141/41 (!) 133/53  Pulse: 67 65  Resp: 16 16  Temp:  37.2 C  SpO2: 92% 98%    Last Pain:  Vitals:   02/05/21 1420  TempSrc:   PainSc: 0-No pain                 [name]

## 2021-02-05 NOTE — Op Note (Addendum)
[facility] Patient Name: Shawna [name] Procedure Date: 02/05/2021 1:41 PM MRN: [id number] Date of Birth: Aug 24, 1940 Attending MD: Elon Alas. Abbey Chatters DO CSN: [id number] Age: 80 Admit Type: Outpatient Procedure:                Upper GI endoscopy Indications:              Acute post hemorrhagic anemia, Melena Providers:                Elon Alas. Abbey Chatters, DO, Gwenlyn Fudge, RN, Aram Candela Referring MD:              Medicines:                See the Anesthesia note for documentation of the                            administered medications Complications:            No immediate complications. Estimated Blood Loss:     Estimated blood loss: none. Procedure:                Pre-Anesthesia Assessment:                           - The anesthesia plan was to use monitored                            anesthesia care (MAC).                           After obtaining informed consent, the endoscope was                            passed under direct vision. Throughout the                            procedure, the patient's blood pressure, pulse, and                            oxygen saturations were monitored continuously. The                            [id number]-[id number] ([id number]) scope was introduced through the                            mouth, and advanced to the second part of duodenum.                            The upper GI endoscopy was accomplished without                            difficulty. The patient tolerated the procedure                            well. Scope In: 2:04:24 PM  Scope Out: 2:08:19 PM Total Procedure Duration: 0 hours 3 minutes 55 seconds  Findings:      Mild candidal esophagitis with no bleeding was found in the distal       esophagus.      Patchy mild inflammation characterized by erythema was found in the       gastric body.      The duodenal bulb, first portion of the duodenum and second portion of       the duodenum were normal.      No  bleeding or stigmata of bleeding found on todays exam. Impression:               - Mildly severe candidiasis esophagitis with no                            bleeding.                           - Gastritis.                           - Normal duodenal bulb, first portion of the                            duodenum and second portion of the duodenum.                           - No specimens collected. Moderate Sedation:      Per Anesthesia Care Recommendation:           - Return patient to hospital ward for ongoing care.                           - Continue to monitor H&H and transfuse for <7.                            Okay to switch to daily PO PPI. Okay to start on                            renal diet.                           - Treat for candidal esophagitis with Diflucan 200                            mg day one then 100 mg daily (renal dosing) for a                            total of 14 days if no contraindications. Procedure Code(s):        --- Professional ---                           [id number], Esophagogastroduodenoscopy, flexible,                            transoral; diagnostic, including collection of  specimen(s) by brushing or washing, when performed                            (separate procedure) Diagnosis Code(s):        --- Professional ---                           B37.81, Candidal esophagitis                           K29.70, Gastritis, unspecified, without bleeding                           D62, Acute posthemorrhagic anemia                           K92.1, Melena (includes Hematochezia) CPT copyright 2019 American Medical Association. All rights reserved. The codes documented in this report are preliminary and upon coder review may  be revised to meet current compliance requirements. Elon Alas. Abbey Chatters, DO [place] Abbey Chatters, DO 02/05/2021 2:25:44 PM This report has been signed electronically. Number of Addenda: 0

## 2021-02-06 DIAGNOSIS — J9601 Acute respiratory failure with hypoxia: Secondary | ICD-10-CM | POA: Diagnosis not present

## 2021-02-06 LAB — CBC WITH DIFFERENTIAL/PLATELET
Abs Immature Granulocytes: 0.02 10*3/uL (ref 0.00–0.07)
Basophils Absolute: 0.1 10*3/uL (ref 0.0–0.1)
Basophils Relative: 1 %
Eosinophils Absolute: 0.4 10*3/uL (ref 0.0–0.5)
Eosinophils Relative: 9 %
HCT: 27.4 % — ABNORMAL LOW (ref 36.0–46.0)
Hemoglobin: 8.5 g/dL — ABNORMAL LOW (ref 12.0–15.0)
Immature Granulocytes: 0 %
Lymphocytes Relative: 14 %
Lymphs Abs: 0.7 10*3/uL (ref 0.7–4.0)
MCH: 37 pg — ABNORMAL HIGH (ref 26.0–34.0)
MCHC: 31 g/dL (ref 30.0–36.0)
MCV: 119.1 fL — ABNORMAL HIGH (ref 80.0–100.0)
Monocytes Absolute: 0.5 10*3/uL (ref 0.1–1.0)
Monocytes Relative: 10 %
Neutro Abs: 3.3 10*3/uL (ref 1.7–7.7)
Neutrophils Relative %: 66 %
Platelets: 290 10*3/uL (ref 150–400)
RBC: 2.3 MIL/uL — ABNORMAL LOW (ref 3.87–5.11)
RDW: 19.3 % — ABNORMAL HIGH (ref 11.5–15.5)
WBC: 5 10*3/uL (ref 4.0–10.5)
nRBC: 0 % (ref 0.0–0.2)

## 2021-02-06 LAB — PHOSPHORUS: Phosphorus: 4.2 mg/dL (ref 2.5–4.6)

## 2021-02-06 LAB — COMPREHENSIVE METABOLIC PANEL
ALT: 71 U/L — ABNORMAL HIGH (ref 0–44)
AST: 84 U/L — ABNORMAL HIGH (ref 15–41)
Albumin: 2.8 g/dL — ABNORMAL LOW (ref 3.5–5.0)
Alkaline Phosphatase: 53 U/L (ref 38–126)
Anion gap: 12 (ref 5–15)
BUN: 23 mg/dL (ref 8–23)
CO2: 27 mmol/L (ref 22–32)
Calcium: 8.5 mg/dL — ABNORMAL LOW (ref 8.9–10.3)
Chloride: 96 mmol/L — ABNORMAL LOW (ref 98–111)
Creatinine, Ser: 3.85 mg/dL — ABNORMAL HIGH (ref 0.44–1.00)
GFR, Estimated: 11 mL/min — ABNORMAL LOW (ref >60)
Glucose, Bld: 95 mg/dL (ref 70–99)
Potassium: 3.3 mmol/L — ABNORMAL LOW (ref 3.5–5.1)
Sodium: 135 mmol/L (ref 135–145)
Total Bilirubin: 0.8 mg/dL (ref 0.3–1.2)
Total Protein: 5.3 g/dL — ABNORMAL LOW (ref 6.5–8.1)

## 2021-02-06 LAB — C-REACTIVE PROTEIN: CRP: 7.8 mg/dL — ABNORMAL HIGH (ref <1.0)

## 2021-02-06 LAB — D-DIMER, QUANTITATIVE: D-Dimer, Quant: 0.49 ug/mL-FEU (ref 0.00–0.50)

## 2021-02-06 LAB — MAGNESIUM: Magnesium: 1.9 mg/dL (ref 1.7–2.4)

## 2021-02-06 LAB — FERRITIN: Ferritin: 1479 ng/mL — ABNORMAL HIGH (ref 11–307)

## 2021-02-06 MED ORDER — ZINC SULFATE 220 (50 ZN) MG PO CAPS: 220.0000 mg | ORAL_CAPSULE | Freq: Every day | ORAL | 0 refills | Status: DC

## 2021-02-06 MED ORDER — ASCORBIC ACID 500 MG PO TABS: 500.0000 mg | ORAL_TABLET | Freq: Every day | ORAL | 0 refills | Status: DC

## 2021-02-06 MED ORDER — GUAIFENESIN-DM 100-10 MG/5ML PO SYRP: 10.0000 mL | ORAL_SOLUTION | ORAL | 0 refills | Status: DC | PRN

## 2021-02-06 MED ORDER — FLUCONAZOLE 100 MG PO TABS: 100.0000 mg | ORAL_TABLET | Freq: Every day | ORAL | 0 refills | Status: AC

## 2021-02-06 NOTE — Discharge Summary (Signed)
Physician Discharge Summary  JENNAVIE MARTINEK LZJ:[id number] DOB: 1941/03/05 DOA: 02/03/2021  PCP: [facility]  Admit date: 02/03/2021  Discharge date: 02/06/2021  Admitted From:Home  Disposition:  Home  Recommendations for Outpatient Follow-up:  Follow up with PCP in 1-2 weeks Please obtain CBC in 1 week Follow-up with gastroenterology outpatient to reevaluate esophageal candidiasis and remain on fluconazole as prescribed for total 14 days as prescribed Continue on home medications as previous Cough medications provided as needed with 3 days of remdesivir completed while inpatient Resume usual MWF hemodialysis schedule on 8/1  Home Health: None  Equipment/Devices: None  Discharge Condition:Stable  CODE STATUS: Full  Diet recommendation: Renal diet   Brief/Interim Summary:  Shawna [name] is a 80 y.o. female with medical history significant for chronic diastolic heart failure, ESRD on HD (MWF) at [facility] in [place], hypertension, hyperlipidemia, CAD s/p CABG, hypothyroidism, GERD who presents to the emergency department accompanied by her husband due to 2-day onset of blood in stool.  She also complained of chest and back soreness which resulted in her coming to the ED this morning.  Patient was admitted with mild acute hypoxemic respiratory failure secondary to fluid overload as well as possible COVID-19 pneumonia.  She was also admitted for GI bleed evaluation and had undergone EGD on 7/29 with findings of esophageal candidiasis for which she was started on Diflucan.  Her hemoglobin levels have remained stable and she has had no further GI bleed noted during the course of the stay.  She was also treated with remdesivir for COVID-19 infection which appears to be mostly incidental.  She was initially noted to have some acute hypoxemic respiratory failure that was likely related to volume overload in setting of missed hemodialysis times this quickly corrected after dialysis.  No other  acute events noted throughout the course of this admission and she is stable for discharge today.  Discharge Diagnoses:  Principal Problem:   Acute respiratory failure with hypoxia ([place]) Active Problems:   ESRD on hemodialysis (HCC)   GERD (gastroesophageal reflux disease)   Acute post-hemorrhagic anemia   Chronic diastolic CHF (congestive heart failure) (HCC)   Essential hypertension   Hypertensive urgency   Coronary artery disease   GI bleed   COVID-19 virus infection   Leukocytosis   Elevated MCV   Hypoalbuminemia due to protein-calorie malnutrition ([place])   Hypothyroidism (acquired)  Principal discharge diagnosis: Acute hypoxemic respiratory failure in the setting of volume overload with incidental COVID-19 virus infection.  GI bleed-resolved with noted esophageal candidiasis.  Discharge Instructions  Discharge Instructions     Diet - low sodium heart healthy   Complete by: As directed    Increase activity slowly   Complete by: As directed    No wound care   Complete by: As directed       Allergies as of 02/06/2021       Reactions   Aspirin Other (See Comments)   High Doses Mess up her stomach; "makes my bowels have blood in them". Takes 81 mg EC Aspirin    Penicillins Other (See Comments)   SYNCOPE? , "makes me real weak when I take it; like I'll pass out" Has patient had a PCN reaction causing immediate rash, facial/tongue/throat swelling, SOB or lightheadedness with hypotension: Yes Has patient had a PCN reaction causing severe rash involving mucus membranes or skin necrosis: no Has patient had a PCN reaction that required hospitalization no Has patient had a PCN reaction occurring within the last 10  years: no If all of the above   Amlodipine Swelling   Bactrim [sulfamethoxazole-trimethoprim] Rash   Contrast Media [iodinated Diagnostic Agents] Itching   Iron Itching, Other (See Comments)   "they gave me iron in dialysis; had to give me Benadryl cause I had to  have the iron" (05/02/2012)   Nitrofurantoin Hives   Tylenol [acetaminophen] Itching, Other (See Comments)   Makes her feet on fire per pt   Gabapentin Other (See Comments)   Unknown reaction   Hydralazine Itching   Has tolerated while inpatient   Levofloxacin    No Known Allergies    Ranexa [ranolazine] Other (See Comments)   Myoclonus-hospitalized    Dexilant [dexlansoprazole] Other (See Comments)   Upset stomach   Levaquin [levofloxacin In D5w] Rash   Morphine And Related Itching, Other (See Comments)   Itching in feet   Plavix [clopidogrel Bisulfate] Rash   Protonix [pantoprazole Sodium] Rash   Venofer [ferric Oxide] Itching, Other (See Comments)   Patient reports using Benadryl prior to doses as [place]        Medication List     TAKE these medications    ascorbic acid 500 MG tablet Commonly known as: VITAMIN C Take 1 tablet (500 mg total) by mouth daily.   aspirin EC 81 MG tablet Take 81 mg by mouth daily.   atorvastatin 10 MG tablet Commonly known as: LIPITOR Take 10 mg by mouth daily.   cyclobenzaprine 5 MG tablet Commonly known as: FLEXERIL Take 5 mg by mouth 3 (three) times daily as needed for muscle spasms.   fluconazole 100 MG tablet Commonly known as: DIFLUCAN Take 1 tablet (100 mg total) by mouth daily for 12 days.   fluticasone 50 MCG/ACT nasal spray Commonly known as: FLONASE Place 1 spray into both nostrils at bedtime as needed for allergies.   guaiFENesin-dextromethorphan 100-10 MG/5ML syrup Commonly known as: ROBITUSSIN DM Take 10 mLs by mouth every 4 (four) hours as needed for cough.   hydrOXYzine 100 MG capsule Commonly known as: VISTARIL Take 100 mg by mouth 2 (two) times daily as needed for anxiety or itching.   isosorbide mononitrate 60 MG 24 hr tablet Commonly known as: IMDUR Take 1 tablet (60 mg total) by mouth in the morning and at bedtime.   levothyroxine 25 MCG tablet Commonly known as: SYNTHROID Take 1 tablet (25  mcg total) by mouth daily.   lidocaine-prilocaine cream Commonly known as: EMLA Apply 1 application topically every Monday, Wednesday, and Friday. Prior to dialysis   loperamide 2 MG tablet Commonly known as: IMODIUM A-D Take 2 tablets (4 mg total) by mouth daily as needed for diarrhea or loose stools.   loratadine 10 MG tablet Commonly known as: CLARITIN Take 1 tablet (10 mg total) by mouth daily as needed for allergies.   metoprolol tartrate 25 MG tablet Commonly known as: LOPRESSOR Take 1 tablet (25 mg total) by mouth 2 (two) times daily.   midodrine 5 MG tablet Commonly known as: PROAMATINE Take Monday, Wednesday, Friday morning BEFORE dialysis What changed:  how much to take how to take this when to take this   multivitamin Tabs tablet Take 1 tablet by mouth daily.   nitroGLYCERIN 0.4 MG SL tablet Commonly known as: NITROSTAT Place 1 tablet (0.4 mg total) under the tongue every 5 (five) minutes x 3 doses as needed for chest pain (if no relief after 2nd dose, proceed to the ED for an evaluation or call 911).   omeprazole  20 MG capsule Commonly known as: PRILOSEC Take 1 capsule (20 mg total) by mouth daily.   sevelamer carbonate 800 MG tablet Commonly known as: RENVELA Take 2 tablets (1,600 mg total) by mouth 3 (three) times daily with meals.   simvastatin 20 MG tablet Commonly known as: ZOCOR Take 20 mg by mouth daily.   triamcinolone cream 0.1 % Commonly known as: KENALOG Apply 1 application topically 2 (two) times daily as needed (itching).   triamcinolone 0.025 % ointment Commonly known as: KENALOG Apply 1 application topically 2 (two) times daily as needed (itching).   zinc sulfate 220 (50 Zn) MG capsule Take 1 capsule (220 mg total) by mouth daily.        Follow-up Information     [facility]. Schedule an appointment as soon as possible for a visit in 1 week(s).   Contact information: [place]  [id number] [phone]         [place]. Go in 2 week(s).   Contact information: [place] [address] [phone]               Allergies  Allergen Reactions   Aspirin Other (See Comments)    High Doses Mess up her stomach; "makes my bowels have blood in them". Takes 81 mg EC Aspirin    Penicillins Other (See Comments)    SYNCOPE? , "makes me real weak when I take it; like I'll pass out"  Has patient had a PCN reaction causing immediate rash, facial/tongue/throat swelling, SOB or lightheadedness with hypotension: Yes Has patient had a PCN reaction causing severe rash involving mucus membranes or skin necrosis: no Has patient had a PCN reaction that required hospitalization no Has patient had a PCN reaction occurring within the last 10 years: no If all of the above   Amlodipine Swelling   Bactrim [Sulfamethoxazole-Trimethoprim] Rash   Contrast Media [Iodinated Diagnostic Agents] Itching   Iron Itching and Other (See Comments)    "they gave me iron in dialysis; had to give me Benadryl cause I had to have the iron" (05/02/2012)   Nitrofurantoin Hives   Tylenol [Acetaminophen] Itching and Other (See Comments)    Makes her feet on fire per pt   Gabapentin Other (See Comments)    Unknown reaction   Hydralazine Itching    Has tolerated while inpatient   Levofloxacin    No Known Allergies    Ranexa [Ranolazine] Other (See Comments)    Myoclonus-hospitalized    Dexilant [Dexlansoprazole] Other (See Comments)    Upset stomach   Levaquin [Levofloxacin In D5w] Rash   Morphine And Related Itching and Other (See Comments)    Itching in feet   Plavix [Clopidogrel Bisulfate] Rash   Protonix [Pantoprazole Sodium] Rash   Venofer [Ferric Oxide] Itching and Other (See Comments)    Patient reports using Benadryl prior to doses as [place]    Consultations: GI   Procedures/Studies: DG Chest 2 View  Result Date:  01/22/2021 CLINICAL DATA:  80 year old female with chest pain. EXAM: CHEST - 2 VIEW COMPARISON:  Chest radiograph dated 12/18/2020. FINDINGS: Small left pleural effusion and left lung base linear atelectasis/scarring. Pneumonia is not excluded. Clinical correlation is recommended. The right lung is clear. No pneumothorax. Mild cardiomegaly. Median sternotomy wires and CABG vascular clips. No acute osseous pathology. Osteopenia with degenerative changes of the spine. Vascular stent noted in the region of the left axilla. IMPRESSION: Small left pleural effusion and  left lung base atelectasis/scarring. Electronically Signed   By: Anner Crete M.D.   On: 01/22/2021 02:47   DG Chest Port 1 View  Result Date: 02/03/2021 CLINICAL DATA:  Shortness of breath.  Blood in stool.  Weakness. EXAM: PORTABLE CHEST 1 VIEW COMPARISON:  01/22/2021 FINDINGS: Mild enlargement of the cardiopericardial silhouette with pulmonary venous hypertension and interstitial accentuation. No blunting of the lateral costophrenic angles. Prior CABG. Atherosclerotic calcification of the aortic arch. Coronary stents noted. Left axillary stent noted. IMPRESSION: 1. Cardiomegaly with pulmonary venous hypertension and interstitial accentuation probably from interstitial edema. 2. Aortic Atherosclerosis (ICD10-I70.0). Electronically Signed   By: Van Clines M.D.   On: 02/03/2021 19:44   US THYROID  Result Date: 01/21/2021 CLINICAL DATA:  Hypothyroidism EXAM: THYROID ULTRASOUND TECHNIQUE: Ultrasound examination of the thyroid gland and adjacent soft tissues was performed. COMPARISON:  None. FINDINGS: Parenchymal Echotexture: Mildly heterogeneous Isthmus: 0.3 cm Right lobe: 4.5 x 2.0 x 1.6 cm Left lobe: 3.9 x 1.8 x 1.7 cm _________________________________________________________ Estimated total number of nodules >/= 1 cm: 1 Number of spongiform nodules >/=  2 cm not described below (TR1): 0 Number of mixed cystic and solid nodules >/= 1.5  cm not described below (TR2): 0 _________________________________________________________ 1.2 x 1.0 x 0.5 cm spongiform nodule in the inferior left thyroid lobe does not meet criteria for FNA or imaging surveillance. IMPRESSION: No suspicious thyroid nodules. The above is in keeping with the ACR TI-RADS recommendations - J Am Coll Radiol 2017;14:587-595. Electronically Signed   By: Miachel Roux M.D.   On: 01/21/2021 13:44     Discharge Exam: Vitals:   02/05/21 2148 02/06/21 0416  BP: 107/65 (!) 194/57  Pulse: 77 71  Resp: 18 (!) 22  Temp: 97.9 F (36.6 C) 98.6 F (37 C)  SpO2: 98% 94%   Vitals:   02/05/21 2130 02/05/21 2148 02/06/21 0416 02/06/21 0600  BP: (!) 145/68 107/65 (!) 194/57   Pulse: 74 77 71   Resp: 18 18 (!) 22   Temp: 98 F (36.7 C) 97.9 F (36.6 C) 98.6 F (37 C)   TempSrc: Oral Oral Oral   SpO2: 95% 98% 94%   Weight:    54.3 kg  Height:        General: Pt is alert, awake, not in acute distress Cardiovascular: RRR, S1/S2 +, no rubs, no gallops Respiratory: CTA bilaterally, no wheezing, no rhonchi Abdominal: Soft, NT, ND, bowel sounds + Extremities: no edema, no cyanosis    The results of significant diagnostics from this hospitalization (including imaging, microbiology, ancillary and laboratory) are listed below for reference.     Microbiology: Recent Results (from the past 240 hour(s))  Resp Panel by RT-PCR (Flu A&B, Covid) Nasopharyngeal Swab     Status: Abnormal   Collection Time: 02/03/21  7:25 PM   Specimen: Nasopharyngeal Swab; Nasopharyngeal(NP) swabs in vial transport medium  Result Value Ref Range Status   SARS Coronavirus 2 by RT PCR POSITIVE (A) NEGATIVE Final    Comment: CRITICAL RESULT CALLED TO, READ BACK BY AND VERIFIED WITH: GESELL,K 2011 02/03/2021 COLEMAN,R (NOTE) SARS-CoV-2 target nucleic acids are DETECTED.  The SARS-CoV-2 RNA is generally detectable in upper respiratory specimens during the acute phase of infection. Positive  results are indicative of the presence of the identified virus, but do not rule out bacterial infection or co-infection with other pathogens not detected by the test. Clinical correlation with patient history and other diagnostic information is necessary to determine patient infection status. The expected  result is Negative.  Fact Sheet for Patients: EntrepreneurPulse.com.au  Fact Sheet for Healthcare Providers: IncredibleEmployment.be  This test is not yet approved or cleared by the [place] FDA and  has been authorized for detection and/or diagnosis of SARS-CoV-2 by FDA under an Emergency Use Authorization (EUA).  This EUA will remain in effect (meaning this t est can be used) for the duration of  the COVID-19 declaration under Section 564(b)(1) of the Act, 21 U.S.C. section 360bbb-3(b)(1), unless the authorization is terminated or revoked sooner.     Influenza A by PCR NEGATIVE NEGATIVE Final   Influenza B by PCR NEGATIVE NEGATIVE Final    Comment: (NOTE) The Xpert Xpress SARS-CoV-2/FLU/RSV plus assay is intended as an aid in the diagnosis of influenza from Nasopharyngeal swab specimens and should not be used as a sole basis for treatment. Nasal washings and aspirates are unacceptable for Xpert Xpress SARS-CoV-2/FLU/RSV testing.  Fact Sheet for Patients: EntrepreneurPulse.com.au  Fact Sheet for Healthcare Providers: IncredibleEmployment.be  This test is not yet approved or cleared by the [place] FDA and has been authorized for detection and/or diagnosis of SARS-CoV-2 by FDA under an Emergency Use Authorization (EUA). This EUA will remain in effect (meaning this test can be used) for the duration of the COVID-19 declaration under Section 564(b)(1) of the Act, 21 U.S.C. section 360bbb-3(b)(1), unless the authorization is terminated or revoked.  Performed at [facility], [address]  [place], [place] [address]      Labs: BNP (last 3 results) Recent Labs    09/29/20 0112  BNP 735.3*   Basic Metabolic Panel: Recent Labs  Lab 02/03/21 1430 02/04/21 0707 02/04/21 1420 02/05/21 0841 02/06/21 0625  NA 134* 156* 134* 137 135  K 5.0 4.2 5.9* 4.0 3.3*  CL 95* 109 95* 97* 96*  CO2 28 20* 23 28 27   GLUCOSE 104* 70 112* 69* 95  BUN 49* 49* 67* 35* 23  CREATININE 8.17* 7.09* 10.06* 5.94* 3.85*  CALCIUM 9.6 6.7* 8.7* 8.2* 8.5*  MG  --  1.6*  --  1.9 1.9  PHOS  --  3.6 6.1* 5.4* 4.2   Liver Function Tests: Recent Labs  Lab 02/03/21 1430 02/04/21 0707 02/04/21 1420 02/05/21 0841 02/06/21 0625  AST 12* 20  --  92* 84*  ALT 11 17  --  72* 71*  ALKPHOS 58 37*  --  46 53  BILITOT 0.8 0.5  --  0.7 0.8  PROT 6.2* 4.0*  --  4.9* 5.3*  ALBUMIN 3.2* 2.1* 3.0* 2.5* 2.8*   No results for input(s): LIPASE, AMYLASE in the last 168 hours. No results for input(s): AMMONIA in the last 168 hours. CBC: Recent Labs  Lab 02/03/21 1430 02/04/21 0707 02/04/21 1420 02/05/21 0841 02/06/21 0625  WBC 10.9* 6.5 7.5 5.1 5.0  NEUTROABS 9.5* 4.8  --  4.3 3.3  HGB 8.9* 7.9* 8.1* 7.6* 8.5*  HCT 28.6* 26.1* 26.2* 25.1* 27.4*  MCV 120.2* 120.8* 120.2* 120.7* 119.1*  PLT 324 295 308 257 290   Cardiac Enzymes: No results for input(s): CKTOTAL, CKMB, CKMBINDEX, TROPONINI in the last 168 hours. BNP: Invalid input(s): POCBNP CBG: No results for input(s): GLUCAP in the last 168 hours. D-Dimer Recent Labs    02/05/21 0841 02/06/21 0625  DDIMER 0.51* 0.49   Hgb A1c No results for input(s): HGBA1C in the last 72 hours. Lipid Profile No results for input(s): CHOL, HDL, LDLCALC, TRIG, CHOLHDL, LDLDIRECT in the last 72 hours. Thyroid function studies No results  for input(s): TSH, T4TOTAL, T3FREE, THYROIDAB in the last 72 hours.  Invalid input(s): FREET3 Anemia work up Recent Labs    02/04/21 0708 02/05/21 0841 02/06/21 0625  VITAMINB12 381  --   --   FOLATE 12.8  --   --    FERRITIN 930* 1,462* 1,479*   Urinalysis    Component Value Date/Time   COLORURINE YELLOW 12/16/2012 1919   APPEARANCEUR CLOUDY (A) 12/16/2012 1919   LABSPEC 1.009 12/16/2012 1919   PHURINE 7.5 12/16/2012 1919   GLUCOSEU NEGATIVE 12/16/2012 1919   HGBUR TRACE (A) 12/16/2012 1919   BILIRUBINUR NEGATIVE 12/16/2012 1919   KETONESUR NEGATIVE 12/16/2012 1919   PROTEINUR 100 (A) 12/16/2012 1919   UROBILINOGEN 0.2 12/16/2012 1919   NITRITE NEGATIVE 12/16/2012 1919   LEUKOCYTESUR SMALL (A) 12/16/2012 1919   Sepsis Labs Invalid input(s): PROCALCITONIN,  WBC,  LACTICIDVEN Microbiology Recent Results (from the past 240 hour(s))  Resp Panel by RT-PCR (Flu A&B, Covid) Nasopharyngeal Swab     Status: Abnormal   Collection Time: 02/03/21  7:25 PM   Specimen: Nasopharyngeal Swab; Nasopharyngeal(NP) swabs in vial transport medium  Result Value Ref Range Status   SARS Coronavirus 2 by RT PCR POSITIVE (A) NEGATIVE Final    Comment: CRITICAL RESULT CALLED TO, READ BACK BY AND VERIFIED WITH: GESELL,K 2011 02/03/2021 COLEMAN,R (NOTE) SARS-CoV-2 target nucleic acids are DETECTED.  The SARS-CoV-2 RNA is generally detectable in upper respiratory specimens during the acute phase of infection. Positive results are indicative of the presence of the identified virus, but do not rule out bacterial infection or co-infection with other pathogens not detected by the test. Clinical correlation with patient history and other diagnostic information is necessary to determine patient infection status. The expected result is Negative.  Fact Sheet for Patients: EntrepreneurPulse.com.au  Fact Sheet for Healthcare Providers: IncredibleEmployment.be  This test is not yet approved or cleared by the [place] FDA and  has been authorized for detection and/or diagnosis of SARS-CoV-2 by FDA under an Emergency Use Authorization (EUA).  This EUA will remain in effect (meaning  this t est can be used) for the duration of  the COVID-19 declaration under Section 564(b)(1) of the Act, 21 U.S.C. section 360bbb-3(b)(1), unless the authorization is terminated or revoked sooner.     Influenza A by PCR NEGATIVE NEGATIVE Final   Influenza B by PCR NEGATIVE NEGATIVE Final    Comment: (NOTE) The Xpert Xpress SARS-CoV-2/FLU/RSV plus assay is intended as an aid in the diagnosis of influenza from Nasopharyngeal swab specimens and should not be used as a sole basis for treatment. Nasal washings and aspirates are unacceptable for Xpert Xpress SARS-CoV-2/FLU/RSV testing.  Fact Sheet for Patients: EntrepreneurPulse.com.au  Fact Sheet for Healthcare Providers: IncredibleEmployment.be  This test is not yet approved or cleared by the [place] FDA and has been authorized for detection and/or diagnosis of SARS-CoV-2 by FDA under an Emergency Use Authorization (EUA). This EUA will remain in effect (meaning this test can be used) for the duration of the COVID-19 declaration under Section 564(b)(1) of the Act, 21 U.S.C. section 360bbb-3(b)(1), unless the authorization is terminated or revoked.  Performed at [facility], [address], [place],  [address]      Time coordinating discharge: 35 minutes  SIGNED:   Rodena Goldmann, DO [facility] 02/06/2021, 9:16 AM  If 7PM-7AM, please contact night-coverage [email or web address]

## 2021-02-07 DIAGNOSIS — J9601 Acute respiratory failure with hypoxia: Secondary | ICD-10-CM | POA: Diagnosis not present

## 2021-02-07 LAB — D-DIMER, QUANTITATIVE: D-Dimer, Quant: 0.46 ug/mL-FEU (ref 0.00–0.50)

## 2021-02-07 LAB — CBC WITH DIFFERENTIAL/PLATELET
Basophils Absolute: 0 10*3/uL (ref 0.0–0.1)
Basophils Relative: 0 %
Eosinophils Absolute: 0.5 10*3/uL (ref 0.0–0.5)
Eosinophils Relative: 10 %
HCT: 30 % — ABNORMAL LOW (ref 36.0–46.0)
Hemoglobin: 9.3 g/dL — ABNORMAL LOW (ref 12.0–15.0)
Lymphocytes Relative: 11 %
Lymphs Abs: 0.6 10*3/uL — ABNORMAL LOW (ref 0.7–4.0)
MCH: 36.2 pg — ABNORMAL HIGH (ref 26.0–34.0)
MCHC: 31 g/dL (ref 30.0–36.0)
MCV: 116.7 fL — ABNORMAL HIGH (ref 80.0–100.0)
Monocytes Absolute: 0.5 10*3/uL (ref 0.1–1.0)
Monocytes Relative: 10 %
Neutro Abs: 3.5 10*3/uL (ref 1.7–7.7)
Neutrophils Relative %: 69 %
Platelets: 315 10*3/uL (ref 150–400)
RBC: 2.57 MIL/uL — ABNORMAL LOW (ref 3.87–5.11)
RDW: 19 % — ABNORMAL HIGH (ref 11.5–15.5)
WBC: 5.1 10*3/uL (ref 4.0–10.5)
nRBC: 0 % (ref 0.0–0.2)

## 2021-02-07 LAB — COMPREHENSIVE METABOLIC PANEL
ALT: 57 U/L — ABNORMAL HIGH (ref 0–44)
AST: 54 U/L — ABNORMAL HIGH (ref 15–41)
Albumin: 3.1 g/dL — ABNORMAL LOW (ref 3.5–5.0)
Alkaline Phosphatase: 62 U/L (ref 38–126)
Anion gap: 13 (ref 5–15)
BUN: 41 mg/dL — ABNORMAL HIGH (ref 8–23)
CO2: 27 mmol/L (ref 22–32)
Calcium: 9 mg/dL (ref 8.9–10.3)
Chloride: 95 mmol/L — ABNORMAL LOW (ref 98–111)
Creatinine, Ser: 6.09 mg/dL — ABNORMAL HIGH (ref 0.44–1.00)
GFR, Estimated: 7 mL/min — ABNORMAL LOW (ref >60)
Glucose, Bld: 84 mg/dL (ref 70–99)
Potassium: 3.3 mmol/L — ABNORMAL LOW (ref 3.5–5.1)
Sodium: 135 mmol/L (ref 135–145)
Total Bilirubin: 0.5 mg/dL (ref 0.3–1.2)
Total Protein: 6.1 g/dL — ABNORMAL LOW (ref 6.5–8.1)

## 2021-02-07 LAB — C-REACTIVE PROTEIN: CRP: 7.2 mg/dL — ABNORMAL HIGH (ref <1.0)

## 2021-02-07 LAB — FERRITIN: Ferritin: 1354 ng/mL — ABNORMAL HIGH (ref 11–307)

## 2021-02-07 LAB — MAGNESIUM: Magnesium: 2.1 mg/dL (ref 1.7–2.4)

## 2021-02-07 LAB — PHOSPHORUS: Phosphorus: 4.2 mg/dL (ref 2.5–4.6)

## 2021-02-07 NOTE — Progress Notes (Signed)
Patient seen and evaluated this a.m. and appears to be stable and ready for discharge.  She appears to answer most questions appropriately and does have some baseline level of pleasant confusion, but this appears to be her baseline.  She has had no agitated behavior or hallucinations otherwise noted.  She was kept an additional night since her husband was not feeling well and he is her only caretaker.  She will have home health physical therapy set up.  Please refer to discharge summary dictated 7/30 for further details.  Total care time: 20 minutes.

## 2021-02-07 NOTE — Progress Notes (Addendum)
Messaged MD on call to inform of BP 172/40 P 61. MD states to hold scheduled Metoprolol. No new orders given.

## 2021-02-07 NOTE — Progress Notes (Signed)
PT Cancellation Note  Patient Details Name: Shawna [name] MRN: [id number] DOB: 09-06-1940   Cancelled Treatment:    Reason Eval/Treat Not Completed: Patient's level of consciousness Patient asleep upon arrival. Able to rouse patient with tactile stimulation of arm as verbal stimulation did not initially rouse patient. Patient initially agreeable to therapy but continued to dose off at least 8 times during initial assessment despite positioning patient upright in bed, turning on the lights, attempt at LE exercises in bed and tactile/verbal stimulation to stay awake. Will re-attempt eval at later date.   10:24 AM, 02/07/21 Jerene Pitch, DPT Physical Therapy with [facility]  [phone] office

## 2021-02-07 NOTE — TOC Transition Note (Signed)
Transition of Care [facility]) - CM/SW Discharge Note   Patient Details  Name: Shawna [name] MRN: [id number] Date of Birth: 1940/09/24  Transition of Care [facility]) CM/SW Contact:  Natasha Bence, LCSW Phone Number: 02/07/2021, 12:52 PM   Clinical Narrative:    CSW notified of patient's readiness for discharge. CSW faxed Facesheet, orders, and d/c summary to [facility] at [phone] to resume [facility] services. TOC signing off.    Final next level of care: [place] Barriers to Discharge: Barriers Resolved   Patient Goals and CMS Choice Patient states their goals for this hospitalization and ongoing recovery are:: Return home with [facility] CMS Medicare.gov Compare Post Acute Care list provided to:: Patient Choice offered to / list presented to : Patient  Discharge Placement                    Patient and family notified of of transfer: 02/07/21  Discharge Plan and Services                          HH Arranged: PT [facility] Agency: [facility] [employer], [place]) Date [place]: 02/07/21 Time [place]: 1252 Representative spoke with at [place]: Fax  Social Determinants of Health ([place]) Interventions     Readmission Risk Interventions Readmission Risk Prevention Plan 11/11/2020 10/04/2020 09/02/2020  Transportation Screening Complete Complete Complete  PCP or Specialist Appt within 3-5 Days - - -  [place] or [place] for [place] - - -  Medication Review Press photographer) Complete Complete Complete  PCP or Specialist appointment within 3-5 days of discharge Complete Complete -  [place] or [place] Complete Complete Complete  SW Recovery Care/Counseling Consult Complete Complete Complete  Palliative Care Screening Not Applicable Not Applicable Not [place] Not Applicable Complete Patient Refused  Some recent data might be  hidden

## 2021-02-09 ENCOUNTER — Encounter (HOSPITAL_COMMUNITY): Payer: Self-pay | Admitting: Internal Medicine

## 2021-02-11 ENCOUNTER — Other Ambulatory Visit: Payer: Self-pay

## 2021-02-11 ENCOUNTER — Emergency Department (HOSPITAL_COMMUNITY)
Admission: EM | Admit: 2021-02-11 | Discharge: 2021-02-11 | Disposition: A | Discharge status: Home / Self Care | Payer: Medicare HMO | Attending: Emergency Medicine | Admitting: Emergency Medicine

## 2021-02-11 ENCOUNTER — Encounter (HOSPITAL_COMMUNITY): Payer: Self-pay

## 2021-02-11 DIAGNOSIS — Z992 Dependence on renal dialysis: Secondary | ICD-10-CM | POA: Insufficient documentation

## 2021-02-11 DIAGNOSIS — I120 Hypertensive chronic kidney disease with stage 5 chronic kidney disease or end stage renal disease: Secondary | ICD-10-CM | POA: Diagnosis not present

## 2021-02-11 DIAGNOSIS — I2510 Atherosclerotic heart disease of native coronary artery without angina pectoris: Secondary | ICD-10-CM | POA: Insufficient documentation

## 2021-02-11 DIAGNOSIS — M546 Pain in thoracic spine: Secondary | ICD-10-CM | POA: Diagnosis present

## 2021-02-11 DIAGNOSIS — B3781 Candidal esophagitis: Secondary | ICD-10-CM | POA: Diagnosis not present

## 2021-02-11 DIAGNOSIS — Z79899 Other long term (current) drug therapy: Secondary | ICD-10-CM | POA: Insufficient documentation

## 2021-02-11 DIAGNOSIS — M1990 Unspecified osteoarthritis, unspecified site: Secondary | ICD-10-CM | POA: Diagnosis not present

## 2021-02-11 DIAGNOSIS — X58XXXA Exposure to other specified factors, initial encounter: Secondary | ICD-10-CM | POA: Diagnosis not present

## 2021-02-11 DIAGNOSIS — S39012A Strain of muscle, fascia and tendon of lower back, initial encounter: Secondary | ICD-10-CM | POA: Diagnosis not present

## 2021-02-11 DIAGNOSIS — N186 End stage renal disease: Secondary | ICD-10-CM | POA: Diagnosis not present

## 2021-02-11 NOTE — ED Provider Notes (Signed)
  Face-to-face evaluation   History: She presents for evaluation of chest pain and upper back pain and low back pain.  She states this discomfort has been there for greater than 1 week during which time she has been admitted, and diagnosed with candidal esophagitis.  He was admitted on 02/03/2021 and discharged on 02/06/2021, during which time she was treated for COVID infection, and GI bleeding.  Her bleeding stabilized without intervention.  She has been started on Diflucan for fungal esophagitis.  She is taking the Diflucan by report of her husband who is with her.  The patient is a poor historian.  She is going to dialysis, last went yesterday.  She denies fever, dizziness or weakness at this time.  She is ambulating normally.  Physical exam: Elderly female alert and cooperative.  No dysarthria or aphasia.  No respiratory distress.  Abdomen soft and nontender.  She is able to sit up on the stretcher easily for examination.  Lungs are clear.  Back is nontender.  Heart regular rate and rhythm without murmur.  Range of motion arms and legs bilaterally.  MDM-nonspecific chest and back pain, most likely related to esophagitis, with a component of musculoskeletal involvement.  Doubt acute unstable process requiring diagnostic evaluation or further intervention at this time.  And recommending symptomatic treatment with Tylenol for pain, continue Diflucan, follow-up with GI as instructed  Medical screening examination/treatment/procedure(s) were conducted as a shared visit with non-physician practitioner(s) and myself.  I personally evaluated the patient during the encounter    Daleen Bo, MD 02/14/21 1110

## 2021-02-11 NOTE — ED Triage Notes (Signed)
Pt not feeling well co chest pain, SOB, and back pain since 3 days.  Skin warm and dry, pt alert and oriented, temp 99.4

## 2021-02-11 NOTE — ED Provider Notes (Signed)
[facility] EMERGENCY DEPARTMENT Provider Note   CSN: [id number] Arrival date & time: 02/11/21  1629     History No chief complaint on file.   Shawna [name] is a 80 y.o. female.  HPI     Shawna [name] is a 80 y.o. female with past medical history of arthritis, coronary artery disease, anemia, esophageal stricture, GERD, and end-stage renal disease on hemodialysis (dialyzed on Monday Wednesday Friday) who presents to the Emergency Department complaining of aching pain of her mid to lower back.  Symptoms began yesterday, persist today.  She describes pain to her entire mid to lower back that is associated with movement and with sitting.  Pain improves while at rest.  No known injury.  She states that she also has discomfort of her chest and slight cough today that has been intermittent.  She was diagnosed with COVID 1 to 2 weeks ago.  She was also recently hospitalized here for GI bleeding.  EGD was performed on 02/05/2021.  Patient was found to have gastritis and esophageal candidiasis and was placed on fluconazole x14 days.  At the time of her admission, her hemoglobin levels remained stable and there was no further GI bleed during her stay.  She had incidental finding of COVID-19 infection and received remdesivir.  She denies any fever, chills, shortness of breath, abdominal pain, pain numbness or weakness of her lower extremities.  No urine or bowel changes.  Past Medical History:  Diagnosis Date   Acute on chronic respiratory failure with hypoxia ([place]) 10/10/2016   Anxiety    Arthritis    AVM (arteriovenous malformation) of colon    CAD (coronary artery disease)    a. s/p CABG in 2013 b. DES to D1 in 10/2016. c. cath in 07/2018 showing patent grafts with occlusion of D1 at prior stent site and progression of PDA disease --> medical management recommended   Carotid artery disease ([place])    a. 03-47% LICA, 10/2593    Chronic anemia    Chronic bronchitis (HCC)    Chronic diastolic CHF  (congestive heart failure) ([place])    a. 02/2012 Echo EF 60-65%, nl wall motion, Gr 1 DD, mod MR   Colon cancer ([place]) 1992   Esophageal stricture    ESRD on hemodialysis ([place])    ESRD due to HTN, started dialysis 2011 and gets HD at [facility] with Dr Hinda Lenis on MWF schedule.  Access is LUA AVF as of Sept 2014.    GERD (gastroesophageal reflux disease)    High cholesterol 12/2011   History of blood transfusion 07/2011; 12/2011; 01/2012 X 2; 04/2012   History of gout    History of lower GI bleeding    Hypertension    Iron deficiency anemia    Mitral regurgitation    a. Moderate by echo, 02/2012   Myocardial infarction [facility])    NSVT (nonsustained ventricular tachycardia) ([place])    Ovarian cancer ([place]) 1992   PAF (paroxysmal atrial fibrillation) ([place])    Pneumonia ~ 2009   PUD (peptic ulcer disease)    TIA (transient ischemic attack)     Patient Active Problem List   Diagnosis Date Noted   COVID-19 virus infection 02/03/2021   Leukocytosis 02/03/2021   Elevated MCV 02/03/2021   Hypoalbuminemia due to protein-calorie malnutrition ([place]) 02/03/2021   Hypothyroidism (acquired) 02/03/2021   Myositis 12/03/2020   Ataxia 12/02/2020   Diabetes mellitus type 2 in nonobese ([place]) 11/10/2020   Acute pulmonary edema ([place]) 11/10/2020  Generalized weakness 11/09/2020   Dialysis AV fistula malfunction, initial encounter ([place])    Jugular vein occlusion, right (HCC)    Failure of surgically constructed arteriovenous fistula ([place]) 10/03/2020   Myoclonus 08/31/2020   Clotted renal dialysis AV graft, initial encounter ([place])    Hypertensive heart and chronic kidney disease with heart failure and stage 1 through stage 4 chronic kidney disease, or chronic kidney disease ([place])    Hemodialysis-associated hypotension    Acute hypoxemic respiratory failure ([place]) 06/14/2020   Hypertension 06/12/2020   Irritable bowel syndrome 02/25/2020   Adenomatous duodenal polyp 09/10/2019   History of GI bleed  09/10/2019   Angina pectoris ([place]) 06/05/2019   Chest pain 06/03/2019   Small intestinal bacterial overgrowth 05/14/2019   Iron deficiency anemia 04/02/2019   GI bleed 03/08/2019   Gastrointestinal hemorrhage with melena 03/06/2019   Acute respiratory failure with hypoxia ([place]) 12/25/2018   Elevated troponin 12/14/2018   Chest pain at rest 07/13/2018   Hand steal syndrome ([place]) 08/01/2017   Anemia 07/14/2017   Coronary artery disease 06/05/2017   Mesenteric ischemia (HCC)    Diverticulitis    Enteritis    Complication of vascular access for dialysis 03/19/2017   Preoperative clearance 01/25/2017   H/O non-ST elevation myocardial infarction (NSTEMI) 10/24/2016   Fluid overload 24/26/8341   Complication from renal dialysis device 10/10/2016   Non-ST elevation MI (NSTEMI) ([place])    Encounter for fitting and adjustment of vascular catheter    Heme positive stool    Demand ischemia ([place]) 07/27/2016   Hypertensive emergency 07/08/2016   Acute on chronic respiratory failure with hypoxia [facility])    Cardiac arrest [facility])    Palliative care encounter    Goals of care, counseling/discussion    Hypertensive crisis without congestive heart failure 05/09/2016   Flash pulmonary edema ([place]) 04/06/2016   Acute respiratory failure ([place]) 04/06/2016   Hypertensive crisis 01/27/2016   History of colon cancer 01/27/2016   History of ovarian cancer 01/27/2016   Hypertensive urgency 01/27/2016   Paroxysmal atrial fibrillation ([place]) 10/14/2015   Coronary angioplasty status 10/14/2015   Malignant neoplasm of right ovary ([place]) 10/14/2015   Narrow complex tachycardia ([place]) 09/08/2015   SVT (supraventricular tachycardia) ([place]) 09/08/2015   Influenza A 08/30/2015   Acute on chronic diastolic CHF (congestive heart failure) ([place]) 05/04/2015   Unstable angina ([place]) 05/03/2015   DOE (dyspnea on exertion)    Essential hypertension    Pain in joint, lower leg 08/14/2014   Dacryocystitis 05/29/2013   Chronic  diastolic CHF (congestive heart failure) ([place]) 03/22/2013   GI bleeding 03/21/2013   Acute post-hemorrhagic anemia 03/21/2013   Occlusion and stenosis of carotid artery without mention of cerebral infarction 01/24/2013   Hx of CABG 07/05/2012   Carotid artery disease ([place]) 07/05/2012   Mitral regurgitation 06/12/2012   Pneumonia 06/09/2012   Non-STEMI (non-ST elevated myocardial infarction) ([place]) 06/08/2012   Ischemic chest pain ([place]) 03/01/2012   AVM (arteriovenous malformation) of small bowel, acquired 01/20/2012   GERD (gastroesophageal reflux disease) 01/09/2012   HLD (hyperlipidemia) 01/05/2012   Atherosclerotic heart disease of native coronary artery without angina pectoris 12/16/2011   Essential hypertension, benign 12/16/2011   ESRD on hemodialysis ([place]) 12/16/2011   Anxiety disorder 05/04/2011   Anemia in chronic kidney disease 04/29/2011   ESRD (end stage renal disease) on dialysis ([place]) 04/29/2011   Gout 04/29/2011   Hypertensive chronic kidney disease with stage 5 chronic kidney disease or end stage renal disease ([place]) 04/29/2011  Past Surgical History:  Procedure Laterality Date   A/V SHUNTOGRAM Left 03/19/2019   Procedure: A/V SHUNTOGRAM;  Surgeon: Katha Cabal, MD;  Location: [place] CV LAB;  Service: Cardiovascular;  Laterality: Left;   ABDOMINAL HYSTERECTOMY  1992   APPENDECTOMY  06/1990   AV FISTULA PLACEMENT  07/2009   left upper arm   AV FISTULA PLACEMENT Right 09/06/2016   Procedure: RIGHT FOREARM ARTERIOVENOUS (AV) GRAFT;  Surgeon: Elam Dutch, MD;  Location: [place];  Service: Vascular;  Laterality: Right;   AV FISTULA PLACEMENT N/A 02/24/2017   Procedure: INSERTION OF ARTERIOVENOUS (AV) GORE-TEX GRAFT ARM (BRACHIAL AXILLARY);  Surgeon: Katha Cabal, MD;  Location: [facility] ORS;  Service: Vascular;  Laterality: N/A;   [place] Right 09/06/2016   Procedure: REMOVAL OF Right Arm ARTERIOVENOUS GORETEX GRAFT and Vein Patch angioplasty of  brachial artery;  Surgeon: Angelia Mould, MD;  Location: [place];  Service: Vascular;  Laterality: Right;   BIOPSY  09/26/2019   Procedure: BIOPSY;  Surgeon: Rogene Houston, MD;  [facility];  Service: Endoscopy;;   COLON RESECTION  1992   COLON SURGERY     COLONOSCOPY N/A 03/09/2019   Procedure: COLONOSCOPY;  Surgeon: Rogene Houston, MD;  [facility];  Service: Endoscopy;  Laterality: N/A;   CORONARY ANGIOPLASTY WITH STENT PLACEMENT  12/15/11   "2"   CORONARY ANGIOPLASTY WITH STENT PLACEMENT  y/2013   "1; makes total of 3" (05/02/2012)   CORONARY ARTERY BYPASS GRAFT  06/13/2012   Procedure: CORONARY ARTERY BYPASS GRAFTING (CABG);  Surgeon: Grace Isaac, MD;  Location: [place];  Service: Open Heart Surgery;  Laterality: N/A;  cabg x four;  using left internal mammary artery, and left leg greater saphenous vein harvested endoscopically   CORONARY STENT INTERVENTION N/A 10/13/2016   Procedure: Coronary Stent Intervention;  Surgeon: Troy Sine, MD;  Location: [place] CV LAB;  Service: Cardiovascular;  Laterality: N/A;   DIALYSIS/PERMA CATHETER REMOVAL N/A 04/18/2017   Procedure: DIALYSIS/PERMA CATHETER REMOVAL;  Surgeon: Katha Cabal, MD;  Location: [place] CV LAB;  Service: Cardiovascular;  Laterality: N/A;   DILATION AND CURETTAGE OF UTERUS     ESOPHAGOGASTRODUODENOSCOPY  01/20/2012   Procedure: ESOPHAGOGASTRODUODENOSCOPY (EGD);  Surgeon: Ladene Artist, MD,FACG;  Location: [facility] ENDOSCOPY;  Service: Endoscopy;  Laterality: N/A;   ESOPHAGOGASTRODUODENOSCOPY N/A 03/26/2013   Procedure: ESOPHAGOGASTRODUODENOSCOPY (EGD);  Surgeon: Irene Shipper, MD;  Location: [facility] ENDOSCOPY;  Service: Endoscopy;  Laterality: N/A;   ESOPHAGOGASTRODUODENOSCOPY N/A 04/30/2015   Procedure: ESOPHAGOGASTRODUODENOSCOPY (EGD);  Surgeon: Rogene Houston, MD;  [facility];  Service: Endoscopy;  Laterality: N/A;  1pm - moved to 10/20 @ 1:10   ESOPHAGOGASTRODUODENOSCOPY  N/A 07/29/2016   Procedure: ESOPHAGOGASTRODUODENOSCOPY (EGD);  Surgeon: Manus Gunning, MD;  Location: [place];  Service: Gastroenterology;  Laterality: N/A;  enteroscopy   ESOPHAGOGASTRODUODENOSCOPY N/A 09/26/2019   Procedure: ESOPHAGOGASTRODUODENOSCOPY (EGD);  Surgeon: Rogene Houston, MD;  [facility];  Service: Endoscopy;  Laterality: N/A;  1250   ESOPHAGOGASTRODUODENOSCOPY (EGD) WITH PROPOFOL N/A 02/05/2021   Procedure: ESOPHAGOGASTRODUODENOSCOPY (EGD) WITH PROPOFOL;  Surgeon: Eloise Harman, DO;  [facility];  Service: Endoscopy;  Laterality: N/A;   GIVENS CAPSULE STUDY N/A 03/07/2019   Procedure: GIVENS CAPSULE STUDY;  Surgeon: Rogene Houston, MD;  [facility];  Service: Endoscopy;  Laterality: N/A;  7:30   INSERTION OF DIALYSIS CATHETER N/A 10/05/2020   Procedure: ABORTED TUNNELED  DIALYSIS CATHETER PLACEMENT RIGHT INTERNAL JUGULAR VEIN ;  Surgeon: Virl Cagey, MD;  Location: [facility];  Service: General;  Laterality: N/A;   INTRAOPERATIVE TRANSESOPHAGEAL ECHOCARDIOGRAM  06/13/2012   Procedure: INTRAOPERATIVE TRANSESOPHAGEAL ECHOCARDIOGRAM;  Surgeon: Grace Isaac, MD;  Location: [place];  Service: Open Heart Surgery;  Laterality: N/A;   IR DIALY SHUNT INTRO NEEDLE/INTRACATH INITIAL W/IMG LEFT Left 10/06/2020   IR FLUORO GUIDE CV LINE RIGHT  06/17/2020   IR GENERIC HISTORICAL  07/26/2016   IR FLUORO GUIDE CV LINE RIGHT 07/26/2016 Greggory Keen, MD [facility]   IR GENERIC HISTORICAL  07/26/2016   IR US GUIDE VASC ACCESS RIGHT 07/26/2016 Greggory Keen, MD [facility]   IR GENERIC HISTORICAL  08/02/2016   IR US GUIDE VASC ACCESS RIGHT 08/02/2016 Greggory Keen, MD [facility]   IR GENERIC HISTORICAL  08/02/2016   IR FLUORO GUIDE CV LINE RIGHT 08/02/2016 Greggory Keen, MD [facility]   IR RADIOLOGY PERIPHERAL GUIDED IV START  03/28/2017   IR REMOVAL TUN CV CATH W/O FL  08/11/2020   IR THROMBECTOMY AV FISTULA W/THROMBOLYSIS INC/SHUNT/IMG  LEFT Left 06/17/2020   IR US GUIDE VASC ACCESS LEFT  06/17/2020   IR US GUIDE VASC ACCESS RIGHT  03/28/2017   IR US GUIDE VASC ACCESS RIGHT  06/17/2020   LEFT HEART CATH AND CORONARY ANGIOGRAPHY N/A 09/20/2016   Procedure: Left Heart Cath and Coronary Angiography;  Surgeon: [name], MD;  Location: [place] CV LAB;  Service: Cardiovascular;  Laterality: N/A;   LEFT HEART CATH AND CORS/GRAFTS ANGIOGRAPHY N/A 10/13/2016   Procedure: Left Heart Cath and Cors/Grafts Angiography;  Surgeon: Troy Sine, MD;  Location: [place] CV LAB;  Service: Cardiovascular;  Laterality: N/A;   LEFT HEART CATH AND CORS/GRAFTS ANGIOGRAPHY N/A 07/13/2018   Procedure: LEFT HEART CATH AND CORS/GRAFTS ANGIOGRAPHY;  Surgeon: Martinique, Peter M, MD;  Location: [place] CV LAB;  Service: Cardiovascular;  Laterality: N/A;   LEFT HEART CATHETERIZATION WITH CORONARY ANGIOGRAM N/A 12/15/2011   Procedure: LEFT HEART CATHETERIZATION WITH CORONARY ANGIOGRAM;  Surgeon: [place] Blanks, MD;  Location: [facility] CATH LAB;  Service: Cardiovascular;  Laterality: N/A;   LEFT HEART CATHETERIZATION WITH CORONARY ANGIOGRAM N/A 01/10/2012   Procedure: LEFT HEART CATHETERIZATION WITH CORONARY ANGIOGRAM;  Surgeon: Peter M Martinique, MD;  Location: [facility] CATH LAB;  Service: Cardiovascular;  Laterality: N/A;   LEFT HEART CATHETERIZATION WITH CORONARY ANGIOGRAM N/A 06/08/2012   Procedure: LEFT HEART CATHETERIZATION WITH CORONARY ANGIOGRAM;  Surgeon: [place] Blanks, MD;  Location: [facility] CATH LAB;  Service: Cardiovascular;  Laterality: N/A;   LEFT HEART CATHETERIZATION WITH CORONARY/GRAFT ANGIOGRAM N/A 12/10/2013   Procedure: LEFT HEART CATHETERIZATION WITH Beatrix Fetters;  Surgeon: [name], MD;  Location: [facility] CATH LAB;  Service: Cardiovascular;  Laterality: N/A;   OVARY SURGERY     ovarian cancer   POLYPECTOMY  03/09/2019   Procedure: POLYPECTOMY;  Surgeon: Rogene Houston, MD;  [facility];  Service: Endoscopy;;   cecal    POLYPECTOMY N/A 09/26/2019   Procedure: DUODENAL POLYPECTOMY;  Surgeon: Rogene Houston, MD;  [facility];  Service: Endoscopy;  Laterality: N/A;   REVISION OF ARTERIOVENOUS GORETEX GRAFT N/A 02/24/2017   Procedure: REVISION OF ARTERIOVENOUS GORETEX GRAFT (RESECTION);  Surgeon: Katha Cabal, MD;  Location: [facility] ORS;  Service: Vascular;  Laterality: N/A;   REVISON OF ARTERIOVENOUS FISTULA Left 06/19/2020   Procedure: REVISION OF LEFT UPPER ARM AV GRAFT WITH INTERPOSITION JUMP GRAFT USING  6MM GORE LIMB;  Surgeon: Marty Heck, MD;  Location: [place];  Service: Vascular;  Laterality: Left;   SHUNTOGRAM N/A 10/15/2013   Procedure: Fistulogram;  Surgeon: Serafina Mitchell, MD;  Location: [facility] CATH LAB;  Service: Cardiovascular;  Laterality: N/A;   THROMBECTOMY / ARTERIOVENOUS GRAFT REVISION  2011   left upper arm   TUBAL LIGATION  1980's   UPPER EXTREMITY ANGIOGRAPHY Bilateral 12/06/2016   Procedure: Upper Extremity Angiography;  Surgeon: Katha Cabal, MD;  Location: [place] CV LAB;  Service: Cardiovascular;  Laterality: Bilateral;   UPPER EXTREMITY INTERVENTION Left 06/06/2017   Procedure: UPPER EXTREMITY INTERVENTION;  Surgeon: Katha Cabal, MD;  Location: [place] CV LAB;  Service: Cardiovascular;  Laterality: Left;     OB History     Gravida  2   Para  2   Term      Preterm  2   AB      Living  2      SAB      IAB      Ectopic      Multiple      Live Births              Family History  Problem Relation Age of Onset   Heart disease Mother        Heart Disease before age 97   Hyperlipidemia Mother    Hypertension Mother    Diabetes Mother    Heart attack Mother    Heart disease Father        Heart Disease before age 45   Hyperlipidemia Father    Hypertension Father    Diabetes Father    Diabetes Sister    Hypertension Sister    Diabetes Brother    Hyperlipidemia Brother    Heart attack Brother     Hypertension Sister    Heart attack Brother    Colon cancer Child 55   Other Other        noncontributory for early CAD   Esophageal cancer Neg Hx    Liver disease Neg Hx    Kidney disease Neg Hx    Colon polyps Neg Hx     Social History   Tobacco Use   Smoking status: Never   Smokeless tobacco: Never  Vaping Use   Vaping Use: Never used  Substance Use Topics   Alcohol use: No    Alcohol/week: 0.0 standard drinks   Drug use: No    Home Medications Prior to Admission medications   Medication Sig Start Date End Date Taking? Authorizing Provider  ascorbic acid (VITAMIN C) 500 MG tablet Take 1 tablet (500 mg total) by mouth daily. 02/06/21 03/08/21  Manuella Ghazi, Pratik D, DO  aspirin EC 81 MG tablet Take 81 mg by mouth daily.  09/27/19   Rehman, Mechele Dawley, MD  atorvastatin (LIPITOR) 10 MG tablet Take 10 mg by mouth daily.    [provider]  cyclobenzaprine (FLEXERIL) 5 MG tablet Take 5 mg by mouth 3 (three) times daily as needed for muscle spasms. 11/30/20   [provider]  fluconazole (DIFLUCAN) 100 MG tablet Take 1 tablet (100 mg total) by mouth daily for 12 days. 02/06/21 02/18/21  Manuella Ghazi, Pratik D, DO  fluticasone (FLONASE) 50 MCG/ACT nasal spray Place 1 spray into both nostrils at bedtime as needed for allergies.     [provider]  guaiFENesin-dextromethorphan (ROBITUSSIN DM) 100-10 MG/5ML syrup Take 10 mLs by mouth every 4 (four) hours  as needed for cough. 02/06/21   Manuella Ghazi, Pratik D, DO  hydrOXYzine (VISTARIL) 100 MG capsule Take 100 mg by mouth 2 (two) times daily as needed for anxiety or itching. 11/23/20   [provider]  isosorbide mononitrate (IMDUR) 60 MG 24 hr tablet Take 1 tablet (60 mg total) by mouth in the morning and at bedtime. 10/20/20 02/04/21  Strader, Fransisco Hertz, PA-C  levothyroxine (SYNTHROID) 25 MCG tablet Take 1 tablet (25 mcg total) by mouth daily. 02/02/21   Brita Romp, NP  lidocaine-prilocaine (EMLA) cream Apply 1 application  topically every Monday, Wednesday, and Friday. Prior to dialysis    [provider]  loperamide (IMODIUM A-D) 2 MG tablet Take 2 tablets (4 mg total) by mouth daily as needed for diarrhea or loose stools. 12/17/20   Thurnell Lose, MD  loratadine (CLARITIN) 10 MG tablet Take 1 tablet (10 mg total) by mouth daily as needed for allergies. 07/14/18   Hosie Poisson, MD  metoprolol tartrate (LOPRESSOR) 25 MG tablet Take 1 tablet (25 mg total) by mouth 2 (two) times daily. 12/17/20   Thurnell Lose, MD  midodrine (PROAMATINE) 5 MG tablet Take Monday, Wednesday, Friday morning BEFORE dialysis 12/22/20   Arnoldo Lenis, MD  multivitamin (RENA-VIT) TABS tablet Take 1 tablet by mouth daily.    [provider]  nitroGLYCERIN (NITROSTAT) 0.4 MG SL tablet Place 1 tablet (0.4 mg total) under the tongue every 5 (five) minutes x 3 doses as needed for chest pain (if no relief after 2nd dose, proceed to the ED for an evaluation or call 911). 09/08/20   Verta Ellen., NP  omeprazole (PRILOSEC) 20 MG capsule Take 1 capsule (20 mg total) by mouth daily. 12/01/20   Rogene Houston, MD  sevelamer carbonate (RENVELA) 800 MG tablet Take 2 tablets (1,600 mg total) by mouth 3 (three) times daily with meals. 06/20/20   Meccariello, Bernita Raisin, DO  simvastatin (ZOCOR) 20 MG tablet Take 20 mg by mouth daily.    [provider]  triamcinolone (KENALOG) 0.025 % ointment Apply 1 application topically 2 (two) times daily as needed (itching). 11/20/20   [provider]  triamcinolone cream (KENALOG) 0.1 % Apply 1 application topically 2 (two) times daily as needed (itching). 11/19/20   [provider]  zinc sulfate 220 (50 Zn) MG capsule Take 1 capsule (220 mg total) by mouth daily. 02/06/21 03/08/21  Manuella Ghazi, Pratik D, DO    Allergies    Aspirin, Penicillins, Amlodipine, Bactrim [sulfamethoxazole-trimethoprim], Contrast media [iodinated diagnostic agents], Iron, Nitrofurantoin, Tylenol  [acetaminophen], Gabapentin, Hydralazine, Levofloxacin, No known allergies, Ranexa [ranolazine], Dexilant [dexlansoprazole], Levaquin [levofloxacin in d5w], Morphine and related, Plavix [clopidogrel bisulfate], Protonix [pantoprazole sodium], and Venofer [ferric oxide]  Review of Systems   Review of Systems  Constitutional:  Negative for chills, fatigue and fever.  HENT:  Negative for sore throat and trouble swallowing.   Respiratory:  Positive for cough. Negative for shortness of breath and wheezing.   Cardiovascular:  Positive for chest pain. Negative for palpitations.  Gastrointestinal:  Negative for abdominal pain, blood in stool, diarrhea, nausea and vomiting.  Genitourinary:  Negative for dysuria, flank pain and hematuria.  Musculoskeletal:  Positive for back pain. Negative for arthralgias, myalgias, neck pain and neck stiffness.  Skin:  Negative for rash.  Neurological:  Negative for dizziness, weakness, numbness and headaches.  Hematological:  Does not bruise/bleed easily.   Physical Exam Updated Vital Signs BP (!) 150/49   Pulse 62  Temp 99.6 F (37.6 C) (Oral)   Resp (!) 22   Ht 5\' 5"  (1.651 m)   Wt 55 kg   SpO2 97%   BMI 20.18 kg/m   Physical Exam Vitals and nursing note reviewed.  Constitutional:      Appearance: Normal appearance.  HENT:     Head: Normocephalic.  Eyes:     Pupils: Pupils are equal, round, and reactive to light.  Neck:     Thyroid: No thyromegaly.     Meningeal: Kernig's sign absent.  Cardiovascular:     Rate and Rhythm: Normal rate and regular rhythm.     Pulses: Normal pulses.     Heart sounds: Normal heart sounds.  Pulmonary:     Effort: Pulmonary effort is normal.     Breath sounds: Normal breath sounds. No wheezing.  Abdominal:     Palpations: Abdomen is soft.     Tenderness: There is no abdominal tenderness. There is no guarding or rebound.  Musculoskeletal:        General: Normal range of motion.     Cervical back: Normal range  of motion and neck supple.  Skin:    General: Skin is warm and dry.     Findings: No rash.  Neurological:     Mental Status: She is alert and oriented to person, place, and time.     Cranial Nerves: Cranial nerves are intact.     Sensory: Sensation is intact. No sensory deficit.     Motor: Motor function is intact. No weakness.     Comments: CN III-XII grossly intact.  Speech clear.     ED Results / Procedures / Treatments   Labs (all labs ordered are listed, but only abnormal results are displayed) Labs Reviewed - No data to display  EKG EKG Interpretation  Date/Time:  Thursday February 11 2021 16:53:16 EDT Ventricular Rate:  65 PR Interval:  176 QRS Duration: 131 QT Interval:  464 QTC Calculation: 483 R Axis:   79 Text Interpretation: Sinus rhythm IVCD, consider atypical LBBB since last tracing no significant change Confirmed by Daleen Bo [phone]) on 02/11/2021 5:26:48 PM  Radiology No results found.  Procedures Procedures   Medications Ordered in ED Medications - No data to display  ED Course  I have reviewed the triage vital signs and the nursing notes.  Pertinent labs & imaging results that were available during my care of the patient were reviewed by me and considered in my medical decision making (see chart for details).    MDM Rules/Calculators/A&P                           Patient here for evaluation of mid to low back pain since yesterday.  Pain is associated with position changes and with sitting.  Pain improves at rest.  She is currently being treated with fluconazole for esophageal candidiasis.  Recently underwent EGD.  Patient was also dialyzed yesterday. On exam, patient has tenderness to palpation of the bilateral thoracic and lumbar paraspinal muscles.  Vital signs reviewed.  Overall, patient well-appearing, appears nontoxic.  No pleuritic chest pain, hypoxia or tachycardia.  Low clinical suspicion for PE, ACS or thoracic aortic aneurysm. No  concerning sx's for cauda equina.    Back pain felt to be musculoskeletal.  Patient has also been evaluated by Dr. Eulis Foster.  Patient felt to be appropriate for discharge home will reiterate discharge instructions to continue her routine dialysis treatments, Tylenol  if needed for pain and to continue her fluconazole daily as directed.  Patient given return precautions as well.   Final Clinical Impression(s) / ED Diagnoses Final diagnoses:  Strain of lumbar region, initial encounter  Esophageal candidiasis [facility])    Rx / DC Orders ED Discharge Orders     None        Kem Parkinson, PA-C 02/11/21 1830    Daleen Bo, MD 02/14/21 1110

## 2021-02-11 NOTE — Discharge Instructions (Addendum)
Your chest pain symptoms are likely related to your esophagitis.  It is important that you continue to take your fluconazole as directed for 12 days.  Also, your back pain is likely from a strain of your back.  You may alternate ice and heat to your back and take over-the-counter Tylenol every 4 hours as needed.  Follow-up with your primary care provider for recheck.

## 2021-02-12 ENCOUNTER — Other Ambulatory Visit: Payer: Self-pay | Admitting: *Deleted

## 2021-02-12 MED ORDER — NITROGLYCERIN 0.4 MG SL SUBL: 0.4000 mg | SUBLINGUAL_TABLET | SUBLINGUAL | 3 refills | Status: DC | PRN

## 2021-02-16 ENCOUNTER — Telehealth: Payer: Self-pay

## 2021-02-16 MED ORDER — NITROGLYCERIN 0.4 MG SL SUBL: 0.4000 mg | SUBLINGUAL_TABLET | SUBLINGUAL | 3 refills | Status: DC | PRN

## 2021-02-16 NOTE — Telephone Encounter (Signed)
Medication refill request approved for SNTGL 0.4 mg tablets.

## 2021-02-19 ENCOUNTER — Other Ambulatory Visit: Payer: Self-pay | Admitting: *Deleted

## 2021-02-19 MED ORDER — NITROGLYCERIN 0.4 MG SL SUBL: 0.4000 mg | SUBLINGUAL_TABLET | SUBLINGUAL | 3 refills | Status: DC | PRN

## 2021-02-23 ENCOUNTER — Telehealth: Payer: Self-pay | Admitting: Cardiology

## 2021-02-23 NOTE — Telephone Encounter (Signed)
She has had chronic stable chest pain symptoms that we talked about last visit. Continue current medications, assuring that improves with SL NG. If significant worsening/progression then yes would need ER eval  Zandra Abts MD

## 2021-02-23 NOTE — Telephone Encounter (Signed)
Reports chest pain last night rated 2/10-used nitroglycerin and felt relief Denies dizziness or sob.  Advised to continue monitoring symptoms, use as needed nitroglycerin. Advised if symptoms get worse, to go to the ED for an evaluation. Verbalized understanding of plan.

## 2021-02-23 NOTE — Telephone Encounter (Signed)
Pt c/o of Chest Pain: STAT if CP now or developed within 24 hours  1. Are you having CP right now?   NO  2. Are you experiencing any other symptoms (ex. SOB, nausea, vomiting, sweating)?   NO  3. How long have you been experiencing CP?  SINCE LAST NIGHT- BUT NOT HURTING AT THE MOMENT- "JUST DOESN'T FEEL LIKE IT'S SUPPOSED TO FEEL"  4. Is your CP continuous or coming and going?   NOT CONTINUOUS- EVERY NOW AND THEN IT "HITS' HER  5. Have you taken Nitroglycerin?   YES- LAST NIGHT  ?  Please call [phone]  Pt requested an apt to see someone in the office

## 2021-02-27 ENCOUNTER — Encounter (HOSPITAL_COMMUNITY): Payer: Self-pay | Admitting: Emergency Medicine

## 2021-02-27 ENCOUNTER — Observation Stay (HOSPITAL_COMMUNITY)
Admission: EM | Admit: 2021-02-27 | Discharge: 2021-02-28 | Disposition: A | Discharge status: Home / Self Care | Payer: Medicare HMO | Attending: Family Medicine | Admitting: Family Medicine

## 2021-02-27 ENCOUNTER — Other Ambulatory Visit: Payer: Self-pay

## 2021-02-27 ENCOUNTER — Emergency Department (HOSPITAL_COMMUNITY): Payer: Medicare HMO

## 2021-02-27 DIAGNOSIS — I132 Hypertensive heart and chronic kidney disease with heart failure and with stage 5 chronic kidney disease, or end stage renal disease: Secondary | ICD-10-CM | POA: Insufficient documentation

## 2021-02-27 DIAGNOSIS — Z79899 Other long term (current) drug therapy: Secondary | ICD-10-CM | POA: Insufficient documentation

## 2021-02-27 DIAGNOSIS — Z886 Allergy status to analgesic agent status: Secondary | ICD-10-CM | POA: Insufficient documentation

## 2021-02-27 DIAGNOSIS — Z8616 Personal history of COVID-19: Secondary | ICD-10-CM | POA: Diagnosis not present

## 2021-02-27 DIAGNOSIS — Z888 Allergy status to other drugs, medicaments and biological substances status: Secondary | ICD-10-CM | POA: Insufficient documentation

## 2021-02-27 DIAGNOSIS — R079 Chest pain, unspecified: Secondary | ICD-10-CM | POA: Diagnosis present

## 2021-02-27 DIAGNOSIS — D649 Anemia, unspecified: Principal | ICD-10-CM | POA: Diagnosis present

## 2021-02-27 DIAGNOSIS — Z7982 Long term (current) use of aspirin: Secondary | ICD-10-CM | POA: Insufficient documentation

## 2021-02-27 DIAGNOSIS — Z951 Presence of aortocoronary bypass graft: Secondary | ICD-10-CM | POA: Insufficient documentation

## 2021-02-27 DIAGNOSIS — E876 Hypokalemia: Secondary | ICD-10-CM | POA: Diagnosis not present

## 2021-02-27 DIAGNOSIS — Z79890 Hormone replacement therapy (postmenopausal): Secondary | ICD-10-CM | POA: Insufficient documentation

## 2021-02-27 DIAGNOSIS — E440 Moderate protein-calorie malnutrition: Secondary | ICD-10-CM | POA: Diagnosis present

## 2021-02-27 DIAGNOSIS — I5033 Acute on chronic diastolic (congestive) heart failure: Secondary | ICD-10-CM | POA: Diagnosis not present

## 2021-02-27 DIAGNOSIS — I480 Paroxysmal atrial fibrillation: Secondary | ICD-10-CM | POA: Insufficient documentation

## 2021-02-27 DIAGNOSIS — Z885 Allergy status to narcotic agent status: Secondary | ICD-10-CM | POA: Insufficient documentation

## 2021-02-27 DIAGNOSIS — N186 End stage renal disease: Secondary | ICD-10-CM | POA: Insufficient documentation

## 2021-02-27 DIAGNOSIS — Z992 Dependence on renal dialysis: Secondary | ICD-10-CM

## 2021-02-27 DIAGNOSIS — Z880 Allergy status to penicillin: Secondary | ICD-10-CM | POA: Insufficient documentation

## 2021-02-27 DIAGNOSIS — Z681 Body mass index (BMI) 19 or less, adult: Secondary | ICD-10-CM | POA: Insufficient documentation

## 2021-02-27 DIAGNOSIS — Z8543 Personal history of malignant neoplasm of ovary: Secondary | ICD-10-CM | POA: Diagnosis not present

## 2021-02-27 DIAGNOSIS — Z881 Allergy status to other antibiotic agents status: Secondary | ICD-10-CM | POA: Insufficient documentation

## 2021-02-27 DIAGNOSIS — Z85038 Personal history of other malignant neoplasm of large intestine: Secondary | ICD-10-CM | POA: Diagnosis not present

## 2021-02-27 DIAGNOSIS — I2510 Atherosclerotic heart disease of native coronary artery without angina pectoris: Secondary | ICD-10-CM | POA: Insufficient documentation

## 2021-02-27 DIAGNOSIS — R072 Precordial pain: Secondary | ICD-10-CM

## 2021-02-27 DIAGNOSIS — E1122 Type 2 diabetes mellitus with diabetic chronic kidney disease: Secondary | ICD-10-CM | POA: Diagnosis not present

## 2021-02-27 DIAGNOSIS — F0390 Unspecified dementia without behavioral disturbance: Secondary | ICD-10-CM | POA: Insufficient documentation

## 2021-02-27 DIAGNOSIS — E871 Hypo-osmolality and hyponatremia: Secondary | ICD-10-CM | POA: Diagnosis present

## 2021-02-27 DIAGNOSIS — Z20822 Contact with and (suspected) exposure to covid-19: Secondary | ICD-10-CM | POA: Diagnosis not present

## 2021-02-27 LAB — COMPREHENSIVE METABOLIC PANEL
ALT: 10 U/L (ref 0–44)
ALT: 12 U/L (ref 0–44)
AST: 9 U/L — ABNORMAL LOW (ref 15–41)
AST: 12 U/L — ABNORMAL LOW (ref 15–41)
Albumin: 2.5 g/dL — ABNORMAL LOW (ref 3.5–5.0)
Albumin: 2.8 g/dL — ABNORMAL LOW (ref 3.5–5.0)
Alkaline Phosphatase: 56 U/L (ref 38–126)
Alkaline Phosphatase: 63 U/L (ref 38–126)
Anion gap: 5 (ref 5–15)
Anion gap: 6 (ref 5–15)
BUN: 9 mg/dL (ref 8–23)
BUN: 10 mg/dL (ref 8–23)
CO2: 30 mmol/L (ref 22–32)
CO2: 31 mmol/L (ref 22–32)
Calcium: 8 mg/dL — ABNORMAL LOW (ref 8.9–10.3)
Calcium: 8.8 mg/dL — ABNORMAL LOW (ref 8.9–10.3)
Chloride: 95 mmol/L — ABNORMAL LOW (ref 98–111)
Chloride: 95 mmol/L — ABNORMAL LOW (ref 98–111)
Creatinine, Ser: 3.28 mg/dL — ABNORMAL HIGH (ref 0.44–1.00)
Creatinine, Ser: 3.64 mg/dL — ABNORMAL HIGH (ref 0.44–1.00)
GFR, Estimated: 14 mL/min — ABNORMAL LOW (ref >60)
GFR, Estimated: 12 mL/min — ABNORMAL LOW (ref >60)
Glucose, Bld: 92 mg/dL (ref 70–99)
Glucose, Bld: 71 mg/dL (ref 70–99)
Potassium: 3.4 mmol/L — ABNORMAL LOW (ref 3.5–5.1)
Potassium: 3.9 mmol/L (ref 3.5–5.1)
Sodium: 130 mmol/L — ABNORMAL LOW (ref 135–145)
Sodium: 132 mmol/L — ABNORMAL LOW (ref 135–145)
Total Bilirubin: 0.5 mg/dL (ref 0.3–1.2)
Total Bilirubin: 0.3 mg/dL (ref 0.3–1.2)
Total Protein: 5.4 g/dL — ABNORMAL LOW (ref 6.5–8.1)
Total Protein: 6 g/dL — ABNORMAL LOW (ref 6.5–8.1)

## 2021-02-27 LAB — CBC WITH DIFFERENTIAL/PLATELET
Abs Immature Granulocytes: 0.01 10*3/uL (ref 0.00–0.07)
Basophils Absolute: 0.1 10*3/uL (ref 0.0–0.1)
Basophils Relative: 1 %
Eosinophils Absolute: 0.3 10*3/uL (ref 0.0–0.5)
Eosinophils Relative: 6 %
HCT: 19.2 % — ABNORMAL LOW (ref 36.0–46.0)
Hemoglobin: 5.8 g/dL — CL (ref 12.0–15.0)
Immature Granulocytes: 0 %
Lymphocytes Relative: 21 %
Lymphs Abs: 0.9 10*3/uL (ref 0.7–4.0)
MCH: 35.4 pg — ABNORMAL HIGH (ref 26.0–34.0)
MCHC: 30.2 g/dL (ref 30.0–36.0)
MCV: 117.1 fL — ABNORMAL HIGH (ref 80.0–100.0)
Monocytes Absolute: 0.4 10*3/uL (ref 0.1–1.0)
Monocytes Relative: 10 %
Neutro Abs: 2.8 10*3/uL (ref 1.7–7.7)
Neutrophils Relative %: 62 %
Platelets: 202 10*3/uL (ref 150–400)
RBC: 1.64 MIL/uL — ABNORMAL LOW (ref 3.87–5.11)
RDW: 18.9 % — ABNORMAL HIGH (ref 11.5–15.5)
WBC: 4.4 10*3/uL (ref 4.0–10.5)
nRBC: 0 % (ref 0.0–0.2)

## 2021-02-27 LAB — RESP PANEL BY RT-PCR (FLU A&B, COVID) ARPGX2
Influenza A by PCR: NEGATIVE
Influenza B by PCR: NEGATIVE
SARS Coronavirus 2 by RT PCR: NEGATIVE

## 2021-02-27 LAB — GLUCOSE, CAPILLARY: Glucose-Capillary: 110 mg/dL — ABNORMAL HIGH (ref 70–99)

## 2021-02-27 LAB — PREPARE RBC (CROSSMATCH)

## 2021-02-27 LAB — LIPASE, BLOOD: Lipase: 26 U/L (ref 11–51)

## 2021-02-27 LAB — POC OCCULT BLOOD, ED: Fecal Occult Bld: NEGATIVE

## 2021-02-27 LAB — TROPONIN I (HIGH SENSITIVITY)
Troponin I (High Sensitivity): 29 ng/L — ABNORMAL HIGH (ref <18)
Troponin I (High Sensitivity): 32 ng/L — ABNORMAL HIGH (ref <18)

## 2021-02-27 IMAGING — DX DG CHEST 1V PORT
1 series · 1 of 1 positions shown · non-contrast
Comparison: [DATE]

CLINICAL DATA: Chest pain

EXAM:
PORTABLE CHEST 1 VIEW

[chest ap]
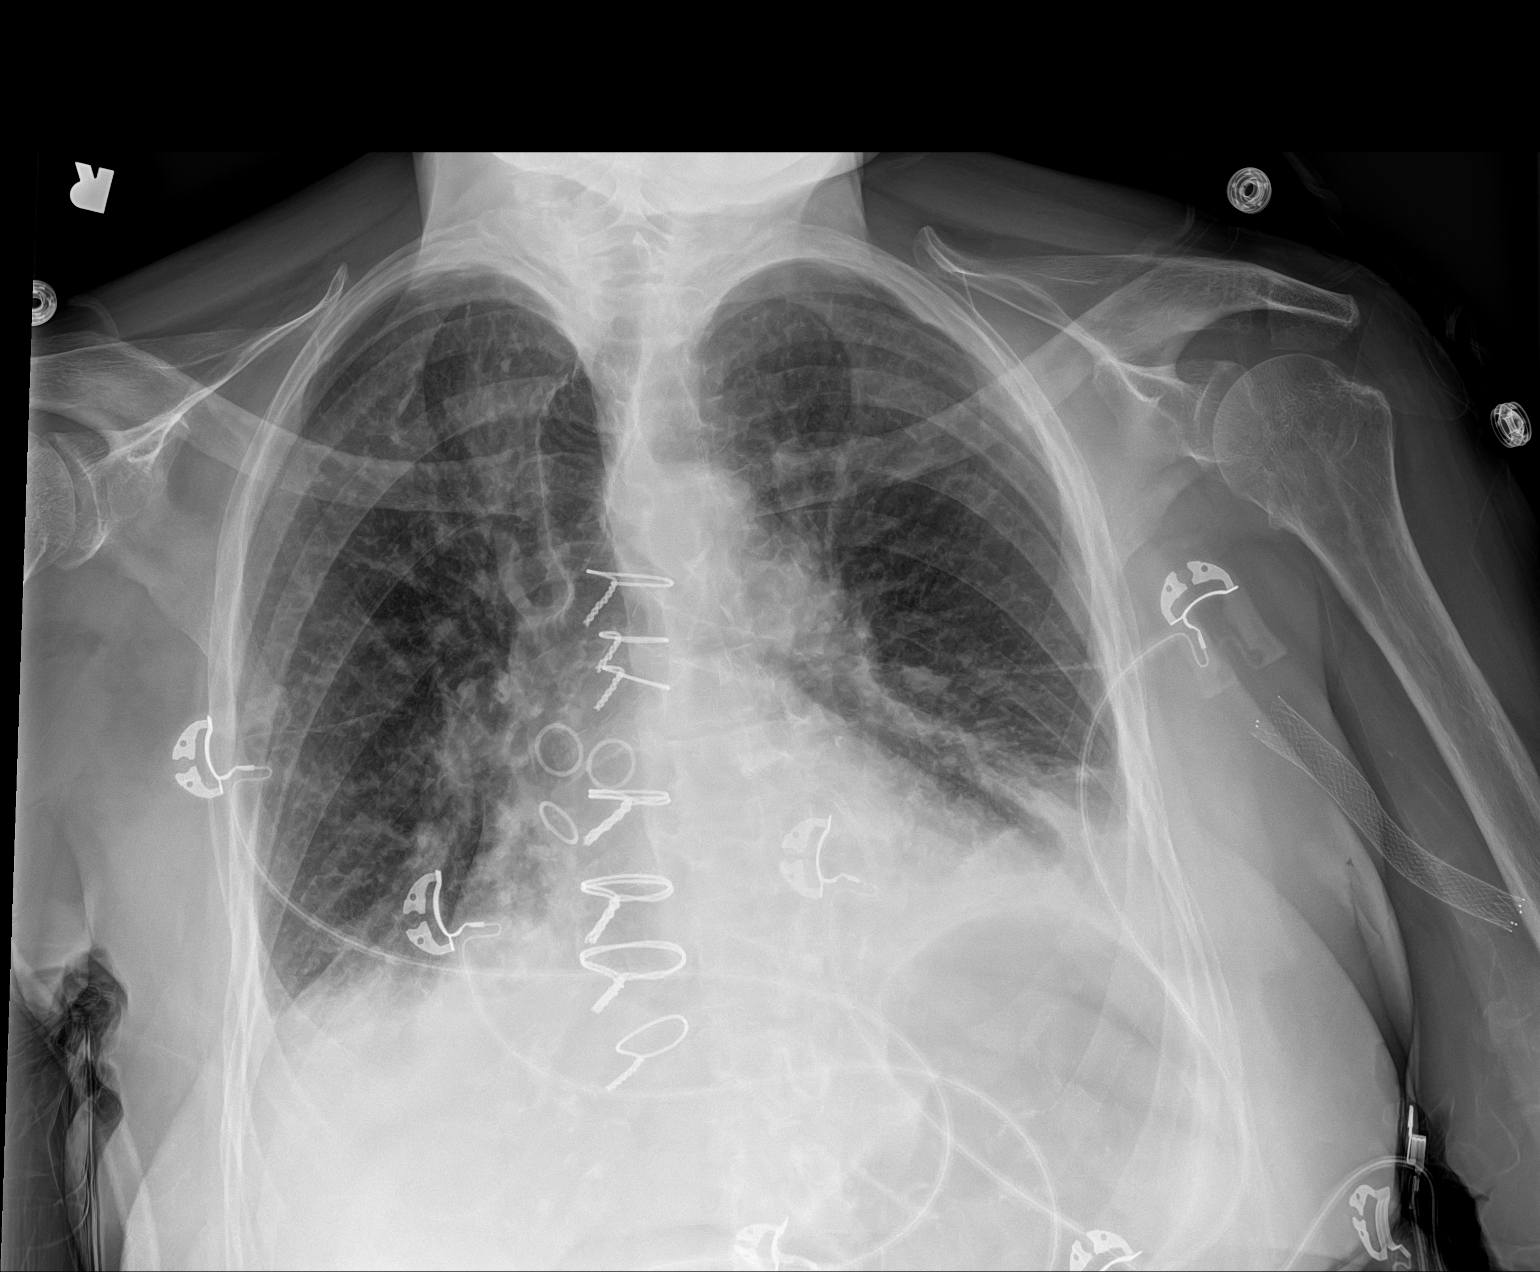

[1 of 1 positions shown; findings below may reference images not displayed]

FINDINGS: Lung volumes are small and pulmonary insufflation has diminished
since prior examination. Superimposed perihilar interstitial
pulmonary infiltrate has improved in the interval since prior
examination. Small bilateral pleural effusions are present. No
pneumothorax. Cardiac size is within normal limits. Coronary artery
bypass grafting has been performed. No acute bone abnormality.
IMPRESSION: Progressive pulmonary hypoinflation.

Improving pulmonary edema, likely cardiogenic, now mild in severity.

## 2021-02-27 MED ORDER — GUAIFENESIN-DM 100-10 MG/5ML PO SYRP
5.0000 mL | ORAL_SOLUTION | ORAL | Status: DC | PRN
  Administered 2021-02-28: 5 mL via ORAL
  Filled 2021-02-27: qty 5

## 2021-02-27 MED ORDER — ONDANSETRON HCL 4 MG PO TABS: 4.0000 mg | ORAL_TABLET | Freq: Four times a day (QID) | ORAL | Status: DC | PRN

## 2021-02-27 MED ORDER — PANTOPRAZOLE SODIUM 40 MG IV SOLR
40.0000 mg | Freq: Two times a day (BID) | INTRAVENOUS | Status: DC
  Administered 2021-02-27 – 2021-02-28 (×2): 40 mg via INTRAVENOUS
  Filled 2021-02-27 (×2): qty 40

## 2021-02-27 MED ORDER — ISOSORBIDE MONONITRATE ER 60 MG PO TB24
60.0000 mg | ORAL_TABLET | Freq: Every day | ORAL | Status: DC
  Administered 2021-02-27 – 2021-02-28 (×2): 60 mg via ORAL
  Filled 2021-02-27 (×2): qty 1

## 2021-02-27 MED ORDER — ONDANSETRON HCL 4 MG/2ML IJ SOLN: 4.0000 mg | Freq: Four times a day (QID) | INTRAMUSCULAR | Status: DC | PRN

## 2021-02-27 MED ORDER — LEVOTHYROXINE SODIUM 25 MCG PO TABS
25.0000 ug | ORAL_TABLET | Freq: Every day | ORAL | Status: DC
  Administered 2021-02-27 – 2021-02-28 (×2): 25 ug via ORAL
  Filled 2021-02-27 (×2): qty 1

## 2021-02-27 MED ORDER — HYDROXYZINE HCL 25 MG PO TABS: 50.0000 mg | ORAL_TABLET | Freq: Two times a day (BID) | ORAL | Status: DC | PRN

## 2021-02-27 MED ORDER — SODIUM CHLORIDE 0.9 % IV SOLN: 10.0000 mL/h | Freq: Once | INTRAVENOUS | Status: DC

## 2021-02-27 MED ORDER — ATORVASTATIN CALCIUM 10 MG PO TABS
10.0000 mg | ORAL_TABLET | Freq: Every day | ORAL | Status: DC
  Administered 2021-02-27 – 2021-02-28 (×2): 10 mg via ORAL
  Filled 2021-02-27 (×2): qty 1

## 2021-02-27 MED ORDER — METOPROLOL TARTRATE 25 MG PO TABS
25.0000 mg | ORAL_TABLET | Freq: Two times a day (BID) | ORAL | Status: DC
  Administered 2021-02-27 – 2021-02-28 (×2): 25 mg via ORAL
  Filled 2021-02-27 (×3): qty 1

## 2021-02-27 MED ORDER — ACETAMINOPHEN 650 MG RE SUPP: 650.0000 mg | Freq: Four times a day (QID) | RECTAL | Status: DC | PRN

## 2021-02-27 MED ORDER — OXYCODONE HCL 5 MG PO TABS
5.0000 mg | ORAL_TABLET | ORAL | Status: DC | PRN
Start: 2021-02-27 — End: 2021-02-28
  Administered 2021-02-28: 5 mg via ORAL
  Filled 2021-02-27: qty 1

## 2021-02-27 MED ORDER — ACETAMINOPHEN 325 MG PO TABS: 650.0000 mg | ORAL_TABLET | Freq: Four times a day (QID) | ORAL | Status: DC | PRN

## 2021-02-27 MED ORDER — CYCLOBENZAPRINE HCL 10 MG PO TABS: 5.0000 mg | ORAL_TABLET | Freq: Three times a day (TID) | ORAL | Status: DC | PRN

## 2021-02-27 MED ORDER — SEVELAMER CARBONATE 800 MG PO TABS
1600.0000 mg | ORAL_TABLET | Freq: Three times a day (TID) | ORAL | Status: DC
  Administered 2021-02-27 – 2021-02-28 (×2): 1600 mg via ORAL
  Filled 2021-02-27 (×3): qty 2

## 2021-02-27 NOTE — H&P (Signed)
TRH H&P    Patient Demographics:    Shawna [name], is a 80 y.o. female  MRN: [id number]  DOB - Jan 07, 1941  Admit Date - 02/27/2021  Referring MD/NP/PA: Long  Outpatient Primary MD for the patient is [facility]  Patient coming from: Home  Chief complaint- Chest pain   HPI:    Shawna [name]  is a 80 y.o. female, with history of anxiety, dementia, coronary artery disease, arteriovenous malformation of the colon, chronic anemia, ESRD, GERD, hypertension, paroxysmal atrial fibrillation, and more presents the ED with a chief complaint of chest pain.  Unfortunately patient is quite exhausted and also agitated because her husband is not at bedside at my exam, so she is declining to answer my questions.  Her nurse is working on finding her husband to help reorient and calm her.  Upon chart review it appears that patient had complained of left chest pain and heaviness.  There were no provocative factors, the pain was intermittent.  He did have some radiation to the left arm.  Patient reports nitroglycerin resolved the pain immediately.  She denies any other symptoms.  Her cardiologist is Dr. Harl Bowie.  Patient does report that she is not in any pain at the time of my exam.  In the ED Temp 98.6, heart rate 63, respiratory rate 18-25, blood pressure 138/48, satting 92% Borderline leukopenia with a white blood cell count of 4.4, anemic with hemoglobin 5.8 Chemistry panel reveals a BUN of 9 and a creatinine of 3.28 Albumin 2.5 Initial troponin 29, repeat is flat at 32 Chest x-ray shows pulmonary hypoinflation and improving edema that is likely cardiogenic EKG shows a heart rate of 82, sinus rhythm, left bundle branch block Transfusion of 1 unit ordered in the ED Admission requested for further management of symptomatic anemia    Review of systems:    Unfortunately review of systems could not be obtained at this  time secondary to patient declining to participate in review of systems    Past History of the following :    Past Medical History:  Diagnosis Date   Acute on chronic respiratory failure with hypoxia ([name]) 10/10/2016   Anxiety    Arthritis    AVM (arteriovenous malformation) of colon    CAD (coronary artery disease)    a. s/p CABG in 2013 b. DES to D1 in 10/2016. c. cath in 07/2018 showing patent grafts with occlusion of D1 at prior stent site and progression of PDA disease --> medical management recommended   Carotid artery disease ([place])    a. 54-65% LICA, 0/3546    Chronic anemia    Chronic bronchitis (HCC)    Chronic diastolic CHF (congestive heart failure) ([place])    a. 02/2012 Echo EF 60-65%, nl wall motion, Gr 1 DD, mod MR   Colon cancer ([place]) 1992   Esophageal stricture    ESRD on hemodialysis ([place])    ESRD due to HTN, started dialysis 2011 and gets HD at [facility] with Dr Hinda Lenis on MWF schedule.  Access is LUA AVF  as of Sept 2014.    GERD (gastroesophageal reflux disease)    High cholesterol 12/2011   History of blood transfusion 07/2011; 12/2011; 01/2012 X 2; 04/2012   History of gout    History of lower GI bleeding    Hypertension    Iron deficiency anemia    Mitral regurgitation    a. Moderate by echo, 02/2012   Myocardial infarction [facility])    NSVT (nonsustained ventricular tachycardia) ([place])    Ovarian cancer ([place]) 1992   PAF (paroxysmal atrial fibrillation) ([place])    Pneumonia ~ 2009   PUD (peptic ulcer disease)    TIA (transient ischemic attack)       Past Surgical History:  Procedure Laterality Date   A/V SHUNTOGRAM Left 03/19/2019   Procedure: A/V SHUNTOGRAM;  Surgeon: Katha Cabal, MD;  Location: [place] CV LAB;  Service: Cardiovascular;  Laterality: Left;   ABDOMINAL HYSTERECTOMY  1992   APPENDECTOMY  06/1990   AV FISTULA PLACEMENT  07/2009   left upper arm   AV FISTULA PLACEMENT Right 09/06/2016   Procedure: RIGHT FOREARM ARTERIOVENOUS (AV)  GRAFT;  Surgeon: Elam Dutch, MD;  Location: [place];  Service: Vascular;  Laterality: Right;   AV FISTULA PLACEMENT N/A 02/24/2017   Procedure: INSERTION OF ARTERIOVENOUS (AV) GORE-TEX GRAFT ARM (BRACHIAL AXILLARY);  Surgeon: Katha Cabal, MD;  Location: [facility] ORS;  Service: Vascular;  Laterality: N/A;   [place] Right 09/06/2016   Procedure: REMOVAL OF Right Arm ARTERIOVENOUS GORETEX GRAFT and Vein Patch angioplasty of brachial artery;  Surgeon: Angelia Mould, MD;  Location: [place];  Service: Vascular;  Laterality: Right;   BIOPSY  09/26/2019   Procedure: BIOPSY;  Surgeon: Rogene Houston, MD;  [facility];  Service: Endoscopy;;   COLON RESECTION  1992   COLON SURGERY     COLONOSCOPY N/A 03/09/2019   Procedure: COLONOSCOPY;  Surgeon: Rogene Houston, MD;  [facility];  Service: Endoscopy;  Laterality: N/A;   CORONARY ANGIOPLASTY WITH STENT PLACEMENT  12/15/11   "2"   CORONARY ANGIOPLASTY WITH STENT PLACEMENT  y/2013   "1; makes total of 3" (05/02/2012)   CORONARY ARTERY BYPASS GRAFT  06/13/2012   Procedure: CORONARY ARTERY BYPASS GRAFTING (CABG);  Surgeon: Grace Isaac, MD;  Location: [place];  Service: Open Heart Surgery;  Laterality: N/A;  cabg x four;  using left internal mammary artery, and left leg greater saphenous vein harvested endoscopically   CORONARY STENT INTERVENTION N/A 10/13/2016   Procedure: Coronary Stent Intervention;  Surgeon: Troy Sine, MD;  Location: [place] [facility];  Service: Cardiovascular;  Laterality: N/A;   DIALYSIS/PERMA CATHETER REMOVAL N/A 04/18/2017   Procedure: DIALYSIS/PERMA CATHETER REMOVAL;  Surgeon: Katha Cabal, MD;  Location: [name] [facility];  Service: Cardiovascular;  Laterality: N/A;   DILATION AND CURETTAGE OF UTERUS     ESOPHAGOGASTRODUODENOSCOPY  01/20/2012   Procedure: ESOPHAGOGASTRODUODENOSCOPY (EGD);  Surgeon: Ladene Artist, MD,FACG;  Location: [facility] ENDOSCOPY;  Service: Endoscopy;   Laterality: N/A;   ESOPHAGOGASTRODUODENOSCOPY N/A 03/26/2013   Procedure: ESOPHAGOGASTRODUODENOSCOPY (EGD);  Surgeon: Irene Shipper, MD;  Location: [name] ENDOSCOPY;  Service: Endoscopy;  Laterality: N/A;   ESOPHAGOGASTRODUODENOSCOPY N/A 04/30/2015   Procedure: ESOPHAGOGASTRODUODENOSCOPY (EGD);  Surgeon: Rogene Houston, MD;  [facility];  Service: Endoscopy;  Laterality: N/A;  1pm - moved to 10/20 @ 1:10   ESOPHAGOGASTRODUODENOSCOPY N/A 07/29/2016   Procedure: ESOPHAGOGASTRODUODENOSCOPY (EGD);  Surgeon: Manus Gunning, MD;  [facility];  Service: Gastroenterology;  Laterality: N/A;  enteroscopy   ESOPHAGOGASTRODUODENOSCOPY N/A 09/26/2019   Procedure: ESOPHAGOGASTRODUODENOSCOPY (EGD);  Surgeon: Rogene Houston, MD;  [facility];  Service: Endoscopy;  Laterality: N/A;  1250   ESOPHAGOGASTRODUODENOSCOPY (EGD) WITH PROPOFOL N/A 02/05/2021   Procedure: ESOPHAGOGASTRODUODENOSCOPY (EGD) WITH PROPOFOL;  Surgeon: Eloise Harman, DO;  [facility];  Service: Endoscopy;  Laterality: N/A;   GIVENS CAPSULE STUDY N/A 03/07/2019   Procedure: GIVENS CAPSULE STUDY;  Surgeon: Rogene Houston, MD;  [facility];  Service: Endoscopy;  Laterality: N/A;  7:30   INSERTION OF DIALYSIS CATHETER N/A 10/05/2020   Procedure: ABORTED TUNNELED DIALYSIS CATHETER PLACEMENT RIGHT INTERNAL JUGULAR VEIN ;  Surgeon: Virl Cagey, MD;  Location: [facility];  Service: General;  Laterality: N/A;   INTRAOPERATIVE TRANSESOPHAGEAL ECHOCARDIOGRAM  06/13/2012   Procedure: INTRAOPERATIVE TRANSESOPHAGEAL ECHOCARDIOGRAM;  Surgeon: Grace Isaac, MD;  Location: [place];  Service: Open Heart Surgery;  Laterality: N/A;   IR DIALY SHUNT INTRO NEEDLE/INTRACATH INITIAL W/IMG LEFT Left 10/06/2020   IR FLUORO GUIDE CV LINE RIGHT  06/17/2020   IR GENERIC HISTORICAL  07/26/2016   IR FLUORO GUIDE CV LINE RIGHT 07/26/2016 Greggory Keen, MD [facility]   IR GENERIC HISTORICAL  07/26/2016   IR  US GUIDE VASC ACCESS RIGHT 07/26/2016 Greggory Keen, MD [facility]   IR GENERIC HISTORICAL  08/02/2016   IR US GUIDE VASC ACCESS RIGHT 08/02/2016 Greggory Keen, MD [facility]   IR GENERIC HISTORICAL  08/02/2016   IR FLUORO GUIDE CV LINE RIGHT 08/02/2016 Greggory Keen, MD [facility]   IR RADIOLOGY PERIPHERAL GUIDED IV START  03/28/2017   IR REMOVAL TUN CV CATH W/O FL  08/11/2020   IR THROMBECTOMY AV FISTULA W/THROMBOLYSIS INC/SHUNT/IMG LEFT Left 06/17/2020   IR US GUIDE VASC ACCESS LEFT  06/17/2020   IR US GUIDE VASC ACCESS RIGHT  03/28/2017   IR US GUIDE VASC ACCESS RIGHT  06/17/2020   LEFT HEART CATH AND CORONARY ANGIOGRAPHY N/A 09/20/2016   Procedure: Left Heart Cath and Coronary Angiography;  Surgeon: [name], MD;  Location: [place] [facility];  Service: Cardiovascular;  Laterality: N/A;   LEFT HEART CATH AND CORS/GRAFTS ANGIOGRAPHY N/A 10/13/2016   Procedure: Left Heart Cath and Cors/Grafts Angiography;  Surgeon: Troy Sine, MD;  Location: [place] [facility];  Service: Cardiovascular;  Laterality: N/A;   LEFT HEART CATH AND CORS/GRAFTS ANGIOGRAPHY N/A 07/13/2018   Procedure: LEFT HEART CATH AND CORS/GRAFTS ANGIOGRAPHY;  Surgeon: Martinique, Peter M, MD;  Location: [place] [facility];  Service: Cardiovascular;  Laterality: N/A;   LEFT HEART CATHETERIZATION WITH CORONARY ANGIOGRAM N/A 12/15/2011   Procedure: LEFT HEART CATHETERIZATION WITH CORONARY ANGIOGRAM;  Surgeon: Burnell Blanks, MD;  Location: [facility] CATH LAB;  Service: Cardiovascular;  Laterality: N/A;   LEFT HEART CATHETERIZATION WITH CORONARY ANGIOGRAM N/A 01/10/2012   Procedure: LEFT HEART CATHETERIZATION WITH CORONARY ANGIOGRAM;  Surgeon: Peter M Martinique, MD;  Location: [facility] CATH LAB;  Service: Cardiovascular;  Laterality: N/A;   LEFT HEART CATHETERIZATION WITH CORONARY ANGIOGRAM N/A 06/08/2012   Procedure: LEFT HEART CATHETERIZATION WITH CORONARY ANGIOGRAM;  Surgeon: Burnell Blanks, MD;  Location: [facility] CATH LAB;  Service:  Cardiovascular;  Laterality: N/A;   LEFT HEART CATHETERIZATION WITH CORONARY/GRAFT ANGIOGRAM N/A 12/10/2013   Procedure: LEFT HEART CATHETERIZATION WITH Beatrix Fetters;  Surgeon: [name], MD;  Location: [facility] CATH LAB;  Service: Cardiovascular;  Laterality: N/A;   OVARY  SURGERY     ovarian cancer   POLYPECTOMY  03/09/2019   Procedure: POLYPECTOMY;  Surgeon: Rogene Houston, MD;  [facility];  Service: Endoscopy;;  cecal    POLYPECTOMY N/A 09/26/2019   Procedure: DUODENAL POLYPECTOMY;  Surgeon: Rogene Houston, MD;  [facility];  Service: Endoscopy;  Laterality: N/A;   REVISION OF ARTERIOVENOUS GORETEX GRAFT N/A 02/24/2017   Procedure: REVISION OF ARTERIOVENOUS GORETEX GRAFT (RESECTION);  Surgeon: Katha Cabal, MD;  Location: [facility] ORS;  Service: Vascular;  Laterality: N/A;   REVISON OF ARTERIOVENOUS FISTULA Left 06/19/2020   Procedure: REVISION OF LEFT UPPER ARM AV GRAFT WITH INTERPOSITION JUMP GRAFT USING 6MM GORE LIMB;  Surgeon: Marty Heck, MD;  Location: [place];  Service: Vascular;  Laterality: Left;   SHUNTOGRAM N/A 10/15/2013   Procedure: Fistulogram;  Surgeon: Serafina Mitchell, MD;  Location: [facility] CATH LAB;  Service: Cardiovascular;  Laterality: N/A;   THROMBECTOMY / ARTERIOVENOUS GRAFT REVISION  2011   left upper arm   TUBAL LIGATION  1980's   UPPER EXTREMITY ANGIOGRAPHY Bilateral 12/06/2016   Procedure: Upper Extremity Angiography;  Surgeon: Katha Cabal, MD;  Location: [place] [facility];  Service: Cardiovascular;  Laterality: Bilateral;   UPPER EXTREMITY INTERVENTION Left 06/06/2017   Procedure: UPPER EXTREMITY INTERVENTION;  Surgeon: Katha Cabal, MD;  Location: [place] [facility];  Service: Cardiovascular;  Laterality: Left;      Social History:      Social History   Tobacco Use   Smoking status: Never   Smokeless tobacco: Never  Substance Use Topics   Alcohol use: No    Alcohol/week: 0.0 standard drinks        Family History :     Family History  Problem Relation Age of Onset   Heart disease Mother        Heart Disease before age 73   Hyperlipidemia Mother    Hypertension Mother    Diabetes Mother    Heart attack Mother    Heart disease Father        Heart Disease before age 79   Hyperlipidemia Father    Hypertension Father    Diabetes Father    Diabetes Sister    Hypertension Sister    Diabetes Brother    Hyperlipidemia Brother    Heart attack Brother    Hypertension Sister    Heart attack Brother    Colon cancer Child 58   Other Other        noncontributory for early CAD   Esophageal cancer Neg Hx    Liver disease Neg Hx    Kidney disease Neg Hx    Colon polyps Neg Hx       Home Medications:   Prior to Admission medications   Medication Sig Start Date End Date Taking? Authorizing Provider  ascorbic acid (VITAMIN C) 500 MG tablet Take 1 tablet (500 mg total) by mouth daily. 02/06/21 03/08/21  Manuella Ghazi, Pratik D, DO  aspirin EC 81 MG tablet Take 81 mg by mouth daily.  09/27/19   Rehman, Mechele Dawley, MD  atorvastatin (LIPITOR) 10 MG tablet Take 10 mg by mouth daily.    [provider]  cyclobenzaprine (FLEXERIL) 5 MG tablet Take 5 mg by mouth 3 (three) times daily as needed for muscle spasms. 11/30/20   [provider]  fluticasone (FLONASE) 50 MCG/ACT nasal spray Place 1 spray into both nostrils at bedtime as needed for allergies.  [provider]  guaiFENesin-dextromethorphan (ROBITUSSIN DM) 100-10 MG/5ML syrup Take 10 mLs by mouth every 4 (four) hours as needed for cough. 02/06/21   Manuella Ghazi, Pratik D, DO  hydrOXYzine (VISTARIL) 100 MG capsule Take 100 mg by mouth 2 (two) times daily as needed for anxiety or itching. 11/23/20   [provider]  isosorbide mononitrate (IMDUR) 60 MG 24 hr tablet Take 1 tablet (60 mg total) by mouth in the morning and at bedtime. 10/20/20 02/04/21  Strader, Fransisco Hertz, PA-C  levothyroxine (SYNTHROID) 25 MCG tablet  Take 1 tablet (25 mcg total) by mouth daily. 02/02/21   Brita Romp, NP  lidocaine-prilocaine (EMLA) cream Apply 1 application topically every Monday, Wednesday, and Friday. Prior to dialysis    [provider]  loperamide (IMODIUM A-D) 2 MG tablet Take 2 tablets (4 mg total) by mouth daily as needed for diarrhea or loose stools. 12/17/20   Thurnell Lose, MD  loratadine (CLARITIN) 10 MG tablet Take 1 tablet (10 mg total) by mouth daily as needed for allergies. 07/14/18   Hosie Poisson, MD  metoprolol tartrate (LOPRESSOR) 25 MG tablet Take 1 tablet (25 mg total) by mouth 2 (two) times daily. 12/17/20   Thurnell Lose, MD  midodrine (PROAMATINE) 5 MG tablet Take Monday, Wednesday, Friday morning BEFORE dialysis 12/22/20   Arnoldo Lenis, MD  multivitamin (RENA-VIT) TABS tablet Take 1 tablet by mouth daily.    [provider]  nitroGLYCERIN (NITROSTAT) 0.4 MG SL tablet Place 1 tablet (0.4 mg total) under the tongue every 5 (five) minutes x 3 doses as needed for chest pain (if no relief after 2nd dose, proceed to the ED for an evaluation or call 911). 02/19/21   Verta Ellen., NP  omeprazole (PRILOSEC) 20 MG capsule Take 1 capsule (20 mg total) by mouth daily. 12/01/20   Rogene [place], MD  sevelamer carbonate (RENVELA) 800 MG tablet Take 2 tablets (1,600 mg total) by mouth 3 (three) times daily with meals. 06/20/20   Meccariello, Bernita Raisin, DO  simvastatin (ZOCOR) 20 MG tablet Take 20 mg by mouth daily.    [provider]  triamcinolone (KENALOG) 0.025 % ointment Apply 1 application topically 2 (two) times daily as needed (itching). 11/20/20   [provider]  triamcinolone cream (KENALOG) 0.1 % Apply 1 application topically 2 (two) times daily as needed (itching). 11/19/20   [provider]  zinc sulfate 220 (50 Zn) MG capsule Take 1 capsule (220 mg total) by mouth daily. 02/06/21 03/08/21  Heath Lark D, DO     Allergies:     Allergies   Allergen Reactions   Aspirin Other (See Comments)    High Doses Mess up her stomach; "makes my bowels have blood in them". Takes 81 mg EC Aspirin    Penicillins Other (See Comments)    SYNCOPE? , "makes me real weak when I take it; like I'll pass out"  Has patient had a PCN reaction causing immediate rash, facial/tongue/throat swelling, SOB or lightheadedness with hypotension: Yes Has patient had a PCN reaction causing severe rash involving mucus membranes or skin necrosis: no Has patient had a PCN reaction that required hospitalization no Has patient had a PCN reaction occurring within the last 10 years: no If all of the above   Amlodipine Swelling   Bactrim [Sulfamethoxazole-Trimethoprim] Rash   Contrast Media [Iodinated Diagnostic Agents] Itching   Iron Itching and Other (See Comments)    "they gave me iron in  dialysis; had to give me Benadryl cause I had to have the iron" (05/02/2012)   Nitrofurantoin Hives   Tylenol [Acetaminophen] Itching and Other (See Comments)    Makes her feet on fire per pt   Gabapentin Other (See Comments)    Unknown reaction   Hydralazine Itching    Has tolerated while inpatient   Levofloxacin    No Known Allergies    Ranexa [Ranolazine] Other (See Comments)    Myoclonus-hospitalized    Dexilant [Dexlansoprazole] Other (See Comments)    Upset stomach   Levaquin [Levofloxacin In D5w] Rash   Morphine And Related Itching and Other (See Comments)    Itching in feet   Plavix [Clopidogrel Bisulfate] Rash   Protonix [Pantoprazole Sodium] Rash   Venofer [Ferric Oxide] Itching and Other (See Comments)    Patient reports using Benadryl prior to doses as [facility]     Physical Exam:   Vitals  Blood pressure (!) 138/48, pulse 63, temperature 98.6 F (37 C), temperature source Oral, resp. rate 20, height 5\' 1"  (1.549 m), weight 47.6 kg, SpO2 92 %.  1.  General: Patient lying supine in bed,  no acute distress   2. Psychiatric: Somnolent and  oriented to person and place, mood is anxious regarding the location of her husband, uncooperative with interview portion of the exam   3. Neurologic: Speech and language are normal, face is symmetric, moves all 4 extremities voluntarily, at baseline without acute deficits on limited exam   4. HEENMT:  Head is atraumatic, normocephalic, pupils reactive to light, neck is supple, trachea is midline, mucous membranes are moist   5. Respiratory : Lungs are clear to auscultation bilaterally without wheezing, rhonchi, rales, no cyanosis, no increase in work of breathing or accessory muscle use   6. Cardiovascular : Heart rate normal, rhythm is regular, no murmurs, rubs or gallops, no peripheral edema, peripheral pulses palpated   7. Gastrointestinal:  Abdomen is soft, nondistended, nontender to palpation bowel sounds active, no masses or organomegaly palpated   8. Skin:  Skin is warm, dry and intact without rashes, acute lesions, or ulcers on limited exam   9.Musculoskeletal:  No acute deformities or trauma, no asymmetry in tone, no peripheral edema, peripheral pulses palpated, no tenderness to palpation in the extremities      Data Review:    CBC Recent Labs  Lab 02/27/21 0311  WBC 4.4  HGB 5.8*  HCT 19.2*  PLT 202  MCV 117.1*  MCH 35.4*  MCHC 30.2  RDW 18.9*  LYMPHSABS 0.9  MONOABS 0.4  EOSABS 0.3  BASOSABS 0.1   ------------------------------------------------------------------------------------------------------------------  Results for orders placed or performed during the hospital encounter of 02/27/21 (from the past 48 hour(s))  Comprehensive metabolic panel     Status: Abnormal   Collection Time: 02/27/21  3:11 AM  Result Value Ref Range   Sodium 130 (L) 135 - 145 mmol/L   Potassium 3.4 (L) 3.5 - 5.1 mmol/L   Chloride 95 (L) 98 - 111 mmol/L   CO2 30 22 - 32 mmol/L   Glucose, Bld 92 70 - 99 mg/dL    Comment: Glucose reference range applies only to samples taken  after fasting for at least 8 hours.   BUN 9 8 - 23 mg/dL   Creatinine, Ser 3.28 (H) 0.44 - 1.00 mg/dL   Calcium 8.0 (L) 8.9 - 10.3 mg/dL   Total Protein 5.4 (L) 6.5 - 8.1 g/dL   Albumin 2.5 (L) 3.5 - 5.0  g/dL   AST 9 (L) 15 - 41 U/L   ALT 10 0 - 44 U/L   Alkaline Phosphatase 56 38 - 126 U/L   Total Bilirubin 0.5 0.3 - 1.2 mg/dL   GFR, Estimated 14 (L) >60 mL/min    Comment: (NOTE) Calculated using the CKD-EPI Creatinine Equation (2021)    Anion gap 5 5 - 15    Comment: Performed at [facility], [address] [place], [place] [address]  Lipase, blood     Status: None   Collection Time: 02/27/21  3:11 AM  Result Value Ref Range   Lipase 26 11 - 51 U/L    Comment: Performed at [facility], [address] [place], [place] [address]  Troponin I (High Sensitivity)     Status: Abnormal   Collection Time: 02/27/21  3:11 AM  Result Value Ref Range   Troponin I (High Sensitivity) 29 (H) <18 ng/L    Comment: (NOTE) Elevated high sensitivity troponin I (hsTnI) values and significant  changes across serial measurements may suggest ACS but many other  chronic and acute conditions are known to elevate hsTnI results.  Refer to the "Links" section for chest pain algorithms and additional  guidance. Performed at [facility], [address], [place], [place] [address]   CBC with Differential     Status: Abnormal   Collection Time: 02/27/21  3:11 AM  Result Value Ref Range   WBC 4.4 4.0 - 10.5 K/uL   RBC 1.64 (L) 3.87 - 5.11 MIL/uL   Hemoglobin 5.8 (LL) 12.0 - 15.0 g/dL    Comment: REPEATED TO VERIFY THIS CRITICAL RESULT HAS VERIFIED AND BEEN CALLED TO PRUITT,G BY JAMIE WOODIE ON 08 20 2022 AT 0417, AND HAS BEEN READ BACK.     HCT 19.2 (L) 36.0 - 46.0 %   MCV 117.1 (H) 80.0 - 100.0 fL   MCH 35.4 (H) 26.0 - 34.0 pg   MCHC 30.2 30.0 - 36.0 g/dL   RDW 18.9 (H) 11.5 - 15.5 %   Platelets 202 150 - 400 K/uL   nRBC 0.0 0.0 - 0.2 %   Neutrophils Relative % 62 %   Neutro Abs 2.8 1.7 - 7.7  K/uL   Lymphocytes Relative 21 %   Lymphs Abs 0.9 0.7 - 4.0 K/uL   Monocytes Relative 10 %   Monocytes Absolute 0.4 0.1 - 1.0 K/uL   Eosinophils Relative 6 %   Eosinophils Absolute 0.3 0.0 - 0.5 K/uL   Basophils Relative 1 %   Basophils Absolute 0.1 0.0 - 0.1 K/uL   Immature Granulocytes 0 %   Abs Immature Granulocytes 0.01 0.00 - 0.07 K/uL    Comment: Performed at [facility], [address], [place], [place] [address]  Type and screen [facility]     Status: None   Collection Time: 02/27/21  3:11 AM  Result Value Ref Range   ABO/RH(D) O POS    Antibody Screen NEG    Sample Expiration      03/02/2021,2359 Performed at [facility], [address] [place], [place] [address]   Prepare RBC (crossmatch)     Status: None   Collection Time: 02/27/21  3:11 AM  Result Value Ref Range   Order Confirmation      ORDER PROCESSED BY BLOOD BANK Performed at [facility], [address] [place], [place] [address]   Troponin I (High Sensitivity)     Status: Abnormal   Collection Time: 02/27/21  4:20 AM  Result Value Ref Range   Troponin I (High Sensitivity) 32 (H) <18 ng/L    Comment: (NOTE) Elevated high sensitivity troponin I (hsTnI) values and significant  changes across serial measurements may suggest ACS but many other  chronic and acute conditions are known to elevate hsTnI results.  Refer to the "Links" section for chest pain algorithms and additional  guidance. Performed at [facility], [address] [place], [place] [address]   POC occult blood, ED     Status: None   Collection Time: 02/27/21  4:54 AM  Result Value Ref Range   Fecal Occult Bld NEGATIVE NEGATIVE   *Note: Due to a large number of results and/or encounters for the requested time period, some results have not been displayed. A complete set of results can be found in Results Review.    Chemistries  Recent Labs  Lab 02/27/21 0311  NA 130*  K 3.4*  CL 95*  CO2 30  GLUCOSE 92  BUN 9  CREATININE  3.28*  CALCIUM 8.0*  AST 9*  ALT 10  ALKPHOS 56  BILITOT 0.5   ------------------------------------------------------------------------------------------------------------------  ------------------------------------------------------------------------------------------------------------------ GFR: Estimated Creatinine Clearance: 10.3 mL/min (A) (by C-G formula based on SCr of 3.28 mg/dL (H)). Liver Function Tests: Recent Labs  Lab 02/27/21 0311  AST 9*  ALT 10  ALKPHOS 56  BILITOT 0.5  PROT 5.4*  ALBUMIN 2.5*   Recent Labs  Lab 02/27/21 0311  LIPASE 26   No results for input(s): AMMONIA in the last 168 hours. Coagulation Profile: No results for input(s): INR, PROTIME in the last 168 hours. Cardiac Enzymes: No results for input(s): CKTOTAL, CKMB, CKMBINDEX, TROPONINI in the last 168 hours. BNP (last 3 results) No results for input(s): PROBNP in the last 8760 hours. HbA1C: No results for input(s): HGBA1C in the last 72 hours. CBG: No results for input(s): GLUCAP in the last 168 hours. Lipid Profile: No results for input(s): CHOL, HDL, LDLCALC, TRIG, CHOLHDL, LDLDIRECT in the last 72 hours. Thyroid Function Tests: No results for input(s): TSH, T4TOTAL, FREET4, T3FREE, THYROIDAB in the last 72 hours. Anemia Panel: No results for input(s): VITAMINB12, FOLATE, FERRITIN, TIBC, IRON, RETICCTPCT in the last 72 hours.  --------------------------------------------------------------------------------------------------------------- Urine analysis:    Component Value Date/Time   COLORURINE YELLOW 12/16/2012 1919   APPEARANCEUR CLOUDY (A) 12/16/2012 1919   LABSPEC 1.009 12/16/2012 1919   PHURINE 7.5 12/16/2012 1919   GLUCOSEU NEGATIVE 12/16/2012 1919   HGBUR TRACE (A) 12/16/2012 1919   BILIRUBINUR NEGATIVE 12/16/2012 1919   KETONESUR NEGATIVE 12/16/2012 1919   PROTEINUR 100 (A) 12/16/2012 1919   UROBILINOGEN 0.2 12/16/2012 1919   NITRITE NEGATIVE 12/16/2012 1919    LEUKOCYTESUR SMALL (A) 12/16/2012 1919      Imaging Results:    DG Chest Portable 1 View  Result Date: 02/27/2021 CLINICAL DATA:  Chest pain EXAM: PORTABLE CHEST 1 VIEW COMPARISON:  02/03/2021 FINDINGS: Lung volumes are small and pulmonary insufflation has diminished since prior examination. Superimposed perihilar interstitial pulmonary infiltrate has improved in the interval since prior examination. Small bilateral pleural effusions are present. No pneumothorax. Cardiac size is within normal limits. Coronary artery bypass grafting has been performed. No acute bone abnormality. IMPRESSION: Progressive pulmonary hypoinflation. Improving pulmonary edema, likely cardiogenic, now mild in severity. Electronically Signed   By: Fidela Salisbury M.D.   On: 02/27/2021 02:46       Assessment & Plan:    Principal Problem:   Symptomatic anemia Active Problems:   ESRD on hemodialysis ([place])  Hyponatremia   Chest pain   Moderate protein-calorie malnutrition (HCC)   Hypokalemia   Symptomatic anemia With chest pain Hemoglobin 5.8 FOBT pending With known history of AVM likely GI bleed N.p.o. Protonix twice daily GI consulted Hemoglobin 5.8, transfusion in process Posttransfusion CBC to be ordered by RN Continue to monitor Chest pain Troponins flat EKG without ischemic changes Patient is currently pain-free Monitor on telemetry ESRD hyponatremia and hypokalemia Consult nephrology Continue Renvela Patient's last dialysis was about 24 hours ago Moderate protein calorie malnutrition When patient is able to take p.o. encourage nutrient dense food choices Hyperlipidemia Continue statin Paroxysmal atrial fibrillation Continue metoprolol Hypothyroidism Continue Synthroid    DVT Prophylaxis-   Heparin- SCDs   AM Labs Ordered, also please review Full Orders  Family Communication: Nurse is currently working on locating husband  Code Status: Full  Admission status: Observation Time  spent in minutes : [place] DO

## 2021-02-27 NOTE — Progress Notes (Signed)
Patient has electronic blood consent signed on chart.

## 2021-02-27 NOTE — Progress Notes (Signed)
PROGRESS NOTE  Brief Narrative: Shawna [name] is an 80 y.o. female with a history of colonic AVMs, ESRD, chronic anemia, dementia, CAD, PAF not on anticoagulation, and HTN who presented to the ED with complaints of chest pain relieved by NTG and exhaustion. She was afebrile with stable vital signs, Hgb 5.8g/dl from a baseline of ~8-9g/dl. BUN was 9, troponins were 29 and 32 without new ischemic ECG features, and chest pain has resolved while in the ED. FOBT negative. Admission requested for symptomatic anemia with 1u PRBCs ordered earlier this morning.  Subjective: Pt confused but denies any pain or dyspnea. Was, per husband at the bedside, feeling more fatigued over past few days and complaining of intermittent chest pain, but none currently. Both deny dark stools or red blood at all.  Objective: BP (!) 151/42   Pulse (!) 51   Temp 98.6 F (37 C) (Oral)   Resp (!) 22   Ht 5\' 1"  (1.549 m)   Wt 47.6 kg   SpO2 92%   BMI 19.84 kg/m   Gen: Nontoxic elderly female Pulm: Clear and nonlabored on room air  CV: RRR, no murmur, no JVD, no edema. +thrill and bruit on LUA access. GI: Soft, NT, ND, +BS  Neuro: Sleeping but rousable, not oriented. Skin: No rashes, lesions or ulcers on visualized skin.  Assessment & Plan: Principal Problem:   Symptomatic anemia Active Problems:   ESRD on hemodialysis (HCC)   Hyponatremia   Chest pain   Moderate protein-calorie malnutrition (HCC)   Hypokalemia  Symptomatic anemia:  - 1u PRBCs ordered in ED, not given yet. Will repeat H/H with goal hgb of 7g/dl or greater with resolution of symptoms.  - With ESRD to explain progressive anemia in the absence of clinical bleeding and negative FOBT/BUN, no urgent GI evaluation is currently necessary. D/w Dr. Jenetta [name].   Chest pain: Suspect stable angina, known to cardiology, Dr. Harl [name]. Troponin relatively low and flat, especially in light of anuric ESRD, and stable ECG is reassuring.  - Continue home beta  blocker, imdur and monitor symptoms.  - NTG prn.  - Holding ASA for right now.   ESRD: HD has been on schedule (MWF in [facility] in [place], last 8/19).  - Unclear whether she's been getting ESA or not. Iron stores appear adequate as mentioned above.  - Would need nephrology consultation if still here Monday, no need for urgent HD.  Shawna Pour, MD Pager on [facility] 02/27/2021, 12:26 PM

## 2021-02-27 NOTE — Consult Note (Addendum)
[name], M.D. Gastroenterology & Hepatology                                           Patient Name: Shawna [name] Account #: @FLAACCTNO @   MRN: [id number] Admission Date: 02/27/2021 Date of Evaluation:  02/27/2021 Time of Evaluation: 12:25 PM   Referring Physician: Carlynn Purl, MD  Chief Complaint: Anemia  HPI:  This is a 80 y.o. female with history of few AVMs in the small bowel, coronary artery disease status post CABG, anxiety, arthritis, carotid artery disease, chronic diastolic CHF, end-stage renal disease on hemodialysis, GERD, hyperlipidemia, hypertension, paroxysmal atrial fibrillation, TIA, history of ovarian cancer, who came to the hospital after she presented worsening fatigue.  Gastroenterology was consulted as she presented anemia.    The patient reports that for the last few days she has presented worsening fatigue and got concerned that for the last 3 days she has presented worsening shortness of breath even while resting.  She denies having any nausea, vomiting, fever, chills, abdominal pain or distention, melena, hematochezia.  She is not currently taking any iron but reported that she has had frequent episodes of anemia for which she has required to come back to the hospital to get transfused.  In fact, the patient was admitted to the hospital on 02/04/2021 after presenting episodes of rectal bleeding.  At that time she underwent an EGD the next day where she was found to have severe candidiasis and was given a prescription for Diflucan but no ulcerations were found in her stomach or small bowel.  Her last colonoscopy was performed on 03/09/2019 which showed presence of diverticulosis throughout the colon, presence of a end-to-side ileocolonic anastomosis, 2 small polyps in the ascending colon, a large polyp in the transverse colon that was removed piecemeal.  Pathology showed findings consistent with tubular adenomas.  She had a previous evaluation for her anemia and  underwent a capsule endoscopy on 03/07/2019 which showed presence of bulbar duodenitis, 2 small jejunal AVMs without active bleeding and a small duodenal diverticulum.  Notably, upon review of her medical record, her iron stores at least since September 2020 have not shown any evidence of worsening levels and they have been normal.  Her most recent stores are from 12/11/2020 which showed an iron of 35, iron saturation of 28% on 05/02/2019, increased ferritin of 1354 on 02/07/2021 with a decreased TIBC of 178.  In the ED, she was HD stable and afebrile. Labs were remarkable for hemoglobin of 5.8 with MCV of 117.  CMP showed a sodium of 130, potassium of 3.4, increased creatinine of 3.28, BUN of 9, normal LFTs and lipase.  Troponin was borderline elevated at 29 and then 32. FOBT was negative. She was ordered to be transfused 2 units of PRBC but has not received them yet.   Past Medical History: SEE CHRONIC ISSSUES: Past Medical History:  Diagnosis Date   Acute on chronic respiratory failure with hypoxia ([place]) 10/10/2016   Anxiety    Arthritis    AVM (arteriovenous malformation) of colon    CAD (coronary artery disease)    a. s/p CABG in 2013 b. DES to D1 in 10/2016. c. cath in 07/2018 showing patent grafts with occlusion of D1 at prior stent site and progression of PDA disease --> medical management recommended   Carotid artery disease ([place])    a. 23-55% LICA,  03/2012    Chronic anemia    Chronic bronchitis (HCC)    Chronic diastolic CHF (congestive heart failure) ([place])    a. 02/2012 Echo EF 60-65%, nl wall motion, Gr 1 DD, mod MR   Colon cancer ([place]) 1992   Esophageal stricture    ESRD on hemodialysis ([place])    ESRD due to HTN, started dialysis 2011 and gets HD at [facility] with Dr Hinda Lenis on MWF schedule.  Access is LUA AVF as of Sept 2014.    GERD (gastroesophageal reflux disease)    High cholesterol 12/2011   History of blood transfusion 07/2011; 12/2011; 01/2012 X 2; 04/2012   History of gout     History of lower GI bleeding    Hypertension    Iron deficiency anemia    Mitral regurgitation    a. Moderate by echo, 02/2012   Myocardial infarction [facility])    NSVT (nonsustained ventricular tachycardia) ([place])    Ovarian cancer ([place]) 1992   PAF (paroxysmal atrial fibrillation) ([place])    Pneumonia ~ 2009   PUD (peptic ulcer disease)    TIA (transient ischemic attack)    Past Surgical History:  Past Surgical History:  Procedure Laterality Date   A/V SHUNTOGRAM Left 03/19/2019   Procedure: A/V SHUNTOGRAM;  Surgeon: Katha Cabal, MD;  Location: [place] CV LAB;  Service: Cardiovascular;  Laterality: Left;   ABDOMINAL HYSTERECTOMY  1992   APPENDECTOMY  06/1990   AV FISTULA PLACEMENT  07/2009   left upper arm   AV FISTULA PLACEMENT Right 09/06/2016   Procedure: RIGHT FOREARM ARTERIOVENOUS (AV) GRAFT;  Surgeon: Elam Dutch, MD;  Location: [place];  Service: Vascular;  Laterality: Right;   AV FISTULA PLACEMENT N/A 02/24/2017   Procedure: INSERTION OF ARTERIOVENOUS (AV) GORE-TEX GRAFT ARM (BRACHIAL AXILLARY);  Surgeon: Katha Cabal, MD;  Location: [facility] ORS;  Service: Vascular;  Laterality: N/A;   [place] Right 09/06/2016   Procedure: REMOVAL OF Right Arm ARTERIOVENOUS GORETEX GRAFT and Vein Patch angioplasty of brachial artery;  Surgeon: Angelia Mould, MD;  Location: [place];  Service: Vascular;  Laterality: Right;   BIOPSY  09/26/2019   Procedure: BIOPSY;  Surgeon: Rogene Houston, MD;  [facility];  Service: Endoscopy;;   COLON RESECTION  1992   COLON SURGERY     COLONOSCOPY N/A 03/09/2019   Procedure: COLONOSCOPY;  Surgeon: Rogene Houston, MD;  [facility];  Service: Endoscopy;  Laterality: N/A;   CORONARY ANGIOPLASTY WITH STENT PLACEMENT  12/15/11   "2"   CORONARY ANGIOPLASTY WITH STENT PLACEMENT  y/2013   "1; makes total of 3" (05/02/2012)   CORONARY ARTERY BYPASS GRAFT  06/13/2012   Procedure: CORONARY ARTERY BYPASS GRAFTING (CABG);   Surgeon: Grace Isaac, MD;  Location: [place];  Service: Open Heart Surgery;  Laterality: N/A;  cabg x four;  using left internal mammary artery, and left leg greater saphenous vein harvested endoscopically   CORONARY STENT INTERVENTION N/A 10/13/2016   Procedure: Coronary Stent Intervention;  Surgeon: Troy Sine, MD;  Location: [place] CV LAB;  Service: Cardiovascular;  Laterality: N/A;   DIALYSIS/PERMA CATHETER REMOVAL N/A 04/18/2017   Procedure: DIALYSIS/PERMA CATHETER REMOVAL;  Surgeon: Katha Cabal, MD;  Location: [place] [facility];  Service: Cardiovascular;  Laterality: N/A;   DILATION AND CURETTAGE OF UTERUS     ESOPHAGOGASTRODUODENOSCOPY  01/20/2012   Procedure: ESOPHAGOGASTRODUODENOSCOPY (EGD);  Surgeon: Ladene Artist, MD,FACG;  Location: [place] [facility];  Service: Endoscopy;  Laterality: N/A;   ESOPHAGOGASTRODUODENOSCOPY N/A 03/26/2013   Procedure: ESOPHAGOGASTRODUODENOSCOPY (EGD);  Surgeon: Irene Shipper, MD;  Location: [facility] ENDOSCOPY;  Service: Endoscopy;  Laterality: N/A;   ESOPHAGOGASTRODUODENOSCOPY N/A 04/30/2015   Procedure: ESOPHAGOGASTRODUODENOSCOPY (EGD);  Surgeon: Rogene Houston, MD;  [facility];  Service: Endoscopy;  Laterality: N/A;  1pm - moved to 10/20 @ 1:10   ESOPHAGOGASTRODUODENOSCOPY N/A 07/29/2016   Procedure: ESOPHAGOGASTRODUODENOSCOPY (EGD);  Surgeon: Manus Gunning, MD;  Location: [place];  Service: Gastroenterology;  Laterality: N/A;  enteroscopy   ESOPHAGOGASTRODUODENOSCOPY N/A 09/26/2019   Procedure: ESOPHAGOGASTRODUODENOSCOPY (EGD);  Surgeon: Rogene Houston, MD;  [facility];  Service: Endoscopy;  Laterality: N/A;  1250   ESOPHAGOGASTRODUODENOSCOPY (EGD) WITH PROPOFOL N/A 02/05/2021   Procedure: ESOPHAGOGASTRODUODENOSCOPY (EGD) WITH PROPOFOL;  Surgeon: Eloise Harman, DO;  [facility];  Service: Endoscopy;  Laterality: N/A;   GIVENS CAPSULE STUDY N/A 03/07/2019   Procedure: GIVENS CAPSULE STUDY;   Surgeon: Rogene Houston, MD;  [facility];  Service: Endoscopy;  Laterality: N/A;  7:30   INSERTION OF DIALYSIS CATHETER N/A 10/05/2020   Procedure: ABORTED TUNNELED DIALYSIS CATHETER PLACEMENT RIGHT INTERNAL JUGULAR VEIN ;  Surgeon: Virl Cagey, MD;  [facility];  Service: General;  Laterality: N/A;   INTRAOPERATIVE TRANSESOPHAGEAL ECHOCARDIOGRAM  06/13/2012   Procedure: INTRAOPERATIVE TRANSESOPHAGEAL ECHOCARDIOGRAM;  Surgeon: Grace Isaac, MD;  Location: [place];  Service: Open Heart Surgery;  Laterality: N/A;   IR DIALY SHUNT INTRO NEEDLE/INTRACATH INITIAL W/IMG LEFT Left 10/06/2020   IR FLUORO GUIDE CV LINE RIGHT  06/17/2020   IR GENERIC HISTORICAL  07/26/2016   IR FLUORO GUIDE CV LINE RIGHT 07/26/2016 Greggory Keen, MD [facility]   IR GENERIC HISTORICAL  07/26/2016   IR US GUIDE VASC ACCESS RIGHT 07/26/2016 Greggory Keen, MD [facility]   IR GENERIC HISTORICAL  08/02/2016   IR US GUIDE VASC ACCESS RIGHT 08/02/2016 Greggory Keen, MD [facility]   IR GENERIC HISTORICAL  08/02/2016   IR FLUORO GUIDE CV LINE RIGHT 08/02/2016 Greggory Keen, MD [facility]   IR RADIOLOGY PERIPHERAL GUIDED IV START  03/28/2017   IR REMOVAL TUN CV CATH W/O FL  08/11/2020   IR THROMBECTOMY AV FISTULA W/THROMBOLYSIS INC/SHUNT/IMG LEFT Left 06/17/2020   IR US GUIDE VASC ACCESS LEFT  06/17/2020   IR US GUIDE VASC ACCESS RIGHT  03/28/2017   IR US GUIDE VASC ACCESS RIGHT  06/17/2020   LEFT HEART CATH AND CORONARY ANGIOGRAPHY N/A 09/20/2016   Procedure: Left Heart Cath and Coronary Angiography;  Surgeon: [name], MD;  Location: [place] [facility];  Service: Cardiovascular;  Laterality: N/A;   LEFT HEART CATH AND CORS/GRAFTS ANGIOGRAPHY N/A 10/13/2016   Procedure: Left Heart Cath and Cors/Grafts Angiography;  Surgeon: Troy Sine, MD;  Location: [place] [facility] LAB;  Service: Cardiovascular;  Laterality: N/A;   LEFT HEART CATH AND CORS/GRAFTS ANGIOGRAPHY N/A 07/13/2018   Procedure: LEFT HEART  CATH AND CORS/GRAFTS ANGIOGRAPHY;  Surgeon: Martinique, Peter M, MD;  Location: [place] [facility];  Service: Cardiovascular;  Laterality: N/A;   LEFT HEART CATHETERIZATION WITH CORONARY ANGIOGRAM N/A 12/15/2011   Procedure: LEFT HEART CATHETERIZATION WITH CORONARY ANGIOGRAM;  Surgeon: Burnell Blanks, MD;  Location: [facility] CATH LAB;  Service: Cardiovascular;  Laterality: N/A;   LEFT HEART CATHETERIZATION WITH CORONARY ANGIOGRAM N/A 01/10/2012   Procedure: LEFT HEART CATHETERIZATION WITH CORONARY ANGIOGRAM;  Surgeon: Peter M Martinique, MD;  Location: [facility]  CATH LAB;  Service: Cardiovascular;  Laterality: N/A;   LEFT HEART CATHETERIZATION WITH CORONARY ANGIOGRAM N/A 06/08/2012   Procedure: LEFT HEART CATHETERIZATION WITH CORONARY ANGIOGRAM;  Surgeon: Burnell Blanks, MD;  Location: [facility] CATH LAB;  Service: Cardiovascular;  Laterality: N/A;   LEFT HEART CATHETERIZATION WITH CORONARY/GRAFT ANGIOGRAM N/A 12/10/2013   Procedure: LEFT HEART CATHETERIZATION WITH Beatrix Fetters;  Surgeon: [name], MD;  Location: [name] CATH LAB;  Service: Cardiovascular;  Laterality: N/A;   OVARY SURGERY     ovarian cancer   POLYPECTOMY  03/09/2019   Procedure: POLYPECTOMY;  Surgeon: Rogene Houston, MD;  [facility];  Service: Endoscopy;;  cecal    POLYPECTOMY N/A 09/26/2019   Procedure: DUODENAL POLYPECTOMY;  Surgeon: Rogene Houston, MD;  [facility];  Service: Endoscopy;  Laterality: N/A;   REVISION OF ARTERIOVENOUS GORETEX GRAFT N/A 02/24/2017   Procedure: REVISION OF ARTERIOVENOUS GORETEX GRAFT (RESECTION);  Surgeon: Katha Cabal, MD;  Location: [facility] ORS;  Service: Vascular;  Laterality: N/A;   REVISON OF ARTERIOVENOUS FISTULA Left 06/19/2020   Procedure: REVISION OF LEFT UPPER ARM AV GRAFT WITH INTERPOSITION JUMP GRAFT USING 6MM GORE LIMB;  Surgeon: Marty Heck, MD;  Location: [place];  Service: Vascular;  Laterality: Left;   SHUNTOGRAM N/A 10/15/2013   Procedure:  Fistulogram;  Surgeon: Serafina Mitchell, MD;  Location: [facility] CATH LAB;  Service: Cardiovascular;  Laterality: N/A;   THROMBECTOMY / ARTERIOVENOUS GRAFT REVISION  2011   left upper arm   TUBAL LIGATION  1980's   UPPER EXTREMITY ANGIOGRAPHY Bilateral 12/06/2016   Procedure: Upper Extremity Angiography;  Surgeon: Katha Cabal, MD;  Location: [place] [facility];  Service: Cardiovascular;  Laterality: Bilateral;   UPPER EXTREMITY INTERVENTION Left 06/06/2017   Procedure: UPPER EXTREMITY INTERVENTION;  Surgeon: Katha Cabal, MD;  Location: [place] CV LAB;  Service: Cardiovascular;  Laterality: Left;   Family History:  Family History  Problem Relation Age of Onset   Heart disease Mother        Heart Disease before age 60   Hyperlipidemia Mother    Hypertension Mother    Diabetes Mother    Heart attack Mother    Heart disease Father        Heart Disease before age 71   Hyperlipidemia Father    Hypertension Father    Diabetes Father    Diabetes Sister    Hypertension Sister    Diabetes Brother    Hyperlipidemia Brother    Heart attack Brother    Hypertension Sister    Heart attack Brother    Colon cancer Child 4   Other Other        noncontributory for early CAD   Esophageal cancer Neg Hx    Liver disease Neg Hx    Kidney disease Neg Hx    Colon polyps Neg Hx    Social History:  Social History   Tobacco Use   Smoking status: Never   Smokeless tobacco: Never  Vaping Use   Vaping Use: Never used  Substance Use Topics   Alcohol use: No    Alcohol/week: 0.0 standard drinks   Drug use: No    Home Medications:  Prior to Admission medications   Medication Sig Start Date End Date Taking? Authorizing Provider  ascorbic acid (VITAMIN C) 500 MG tablet Take 1 tablet (500 mg total) by mouth daily. 02/06/21 03/08/21  Heath Lark D, DO  aspirin EC 81 MG tablet Take  81 mg by mouth daily.  09/27/19   Rehman, Mechele Dawley, MD  atorvastatin (LIPITOR) 10 MG tablet Take 10 mg by  mouth daily.    [provider]  cyclobenzaprine (FLEXERIL) 5 MG tablet Take 5 mg by mouth 3 (three) times daily as needed for muscle spasms. 11/30/20   [provider]  fluticasone (FLONASE) 50 MCG/ACT nasal spray Place 1 spray into both nostrils at bedtime as needed for allergies.     [provider]  guaiFENesin-dextromethorphan (ROBITUSSIN DM) 100-10 MG/5ML syrup Take 10 mLs by mouth every 4 (four) hours as needed for cough. 02/06/21   Manuella Ghazi, Pratik D, DO  hydrOXYzine (VISTARIL) 100 MG capsule Take 100 mg by mouth 2 (two) times daily as needed for anxiety or itching. 11/23/20   [provider]  isosorbide mononitrate (IMDUR) 60 MG 24 hr tablet Take 1 tablet (60 mg total) by mouth in the morning and at bedtime. 10/20/20 02/04/21  Strader, Fransisco Hertz, PA-C  levothyroxine (SYNTHROID) 25 MCG tablet Take 1 tablet (25 mcg total) by mouth daily. 02/02/21   Brita Romp, NP  lidocaine-prilocaine (EMLA) cream Apply 1 application topically every Monday, Wednesday, and Friday. Prior to dialysis    [provider]  loperamide (IMODIUM A-D) 2 MG tablet Take 2 tablets (4 mg total) by mouth daily as needed for diarrhea or loose stools. 12/17/20   Thurnell Lose, MD  loratadine (CLARITIN) 10 MG tablet Take 1 tablet (10 mg total) by mouth daily as needed for allergies. 07/14/18   Hosie Poisson, MD  metoprolol tartrate (LOPRESSOR) 25 MG tablet Take 1 tablet (25 mg total) by mouth 2 (two) times daily. 12/17/20   Thurnell Lose, MD  midodrine (PROAMATINE) 5 MG tablet Take Monday, Wednesday, Friday morning BEFORE dialysis 12/22/20   Arnoldo Lenis, MD  multivitamin (RENA-VIT) TABS tablet Take 1 tablet by mouth daily.    [provider]  nitroGLYCERIN (NITROSTAT) 0.4 MG SL tablet Place 1 tablet (0.4 mg total) under the tongue every 5 (five) minutes x 3 doses as needed for chest pain (if no relief after 2nd dose, proceed to the ED for an evaluation or call 911).  02/19/21   Verta Ellen., NP  omeprazole (PRILOSEC) 20 MG capsule Take 1 capsule (20 mg total) by mouth daily. 12/01/20   Rogene Houston, MD  sevelamer carbonate (RENVELA) 800 MG tablet Take 2 tablets (1,600 mg total) by mouth 3 (three) times daily with meals. 06/20/20   Meccariello, Bernita Raisin, DO  simvastatin (ZOCOR) 20 MG tablet Take 20 mg by mouth daily.    [provider]  triamcinolone (KENALOG) 0.025 % ointment Apply 1 application topically 2 (two) times daily as needed (itching). 11/20/20   [provider]  triamcinolone cream (KENALOG) 0.1 % Apply 1 application topically 2 (two) times daily as needed (itching). 11/19/20   [provider]  zinc sulfate 220 (50 Zn) MG capsule Take 1 capsule (220 mg total) by mouth daily. 02/06/21 03/08/21  Heath Lark D, DO    Inpatient Medications:  Current Facility-Administered Medications:    acetaminophen (TYLENOL) tablet 650 mg, 650 mg, Oral, Q6H PRN **OR** acetaminophen (TYLENOL) suppository 650 mg, 650 mg, Rectal, Q6H PRN, Zierle-Ghosh, Asia B, DO   atorvastatin (LIPITOR) tablet 10 mg, 10 mg, Oral, Daily, Zierle-Ghosh, Asia B, DO, 10 mg at 02/27/21 0930   cyclobenzaprine (FLEXERIL) tablet 5 mg, 5 mg, Oral, TID PRN, Zierle-Ghosh, Asia B, DO   hydrOXYzine (ATARAX/VISTARIL) tablet 50  mg, 50 mg, Oral, BID PRN, Zierle-Ghosh, Asia B, DO   levothyroxine (SYNTHROID) tablet 25 mcg, 25 mcg, Oral, Daily, Zierle-Ghosh, Asia B, DO, 25 mcg at 02/27/21 0930   metoprolol tartrate (LOPRESSOR) tablet 25 mg, 25 mg, Oral, BID, Zierle-Ghosh, Asia B, DO, 25 mg at 02/27/21 0930   ondansetron (ZOFRAN) tablet 4 mg, 4 mg, Oral, Q6H PRN **OR** ondansetron (ZOFRAN) injection 4 mg, 4 mg, Intravenous, Q6H PRN, Zierle-Ghosh, Asia B, DO   oxyCODONE (Oxy IR/ROXICODONE) immediate release tablet 5 mg, 5 mg, Oral, Q4H PRN, Zierle-Ghosh, Asia B, DO   pantoprazole (PROTONIX) injection 40 mg, 40 mg, Intravenous, Q12H, Zierle-Ghosh, Asia B, DO, 40 mg at 02/27/21  0931   sevelamer carbonate (RENVELA) tablet 1,600 mg, 1,600 mg, Oral, TID WC, Zierle-Ghosh, Asia B, DO, 1,600 mg at 02/27/21 0930  Current Outpatient Medications:    ascorbic acid (VITAMIN C) 500 MG tablet, Take 1 tablet (500 mg total) by mouth daily., Disp: 30 tablet, Rfl: 0   aspirin EC 81 MG tablet, Take 81 mg by mouth daily. , Disp:  , Rfl:    atorvastatin (LIPITOR) 10 MG tablet, Take 10 mg by mouth daily., Disp: , Rfl:    cyclobenzaprine (FLEXERIL) 5 MG tablet, Take 5 mg by mouth 3 (three) times daily as needed for muscle spasms., Disp: , Rfl:    fluticasone (FLONASE) 50 MCG/ACT nasal spray, Place 1 spray into both nostrils at bedtime as needed for allergies. , Disp: , Rfl:    guaiFENesin-dextromethorphan (ROBITUSSIN DM) 100-10 MG/5ML syrup, Take 10 mLs by mouth every 4 (four) hours as needed for cough., Disp: 118 mL, Rfl: 0   hydrOXYzine (VISTARIL) 100 MG capsule, Take 100 mg by mouth 2 (two) times daily as needed for anxiety or itching., Disp: , Rfl:    isosorbide mononitrate (IMDUR) 60 MG 24 hr tablet, Take 1 tablet (60 mg total) by mouth in the morning and at bedtime., Disp: 180 tablet, Rfl: 3   levothyroxine (SYNTHROID) 25 MCG tablet, Take 1 tablet (25 mcg total) by mouth daily., Disp: 90 tablet, Rfl: 0   lidocaine-prilocaine (EMLA) cream, Apply 1 application topically every Monday, Wednesday, and Friday. Prior to dialysis, Disp: , Rfl:    loperamide (IMODIUM A-D) 2 MG tablet, Take 2 tablets (4 mg total) by mouth daily as needed for diarrhea or loose stools., Disp: , Rfl:    loratadine (CLARITIN) 10 MG tablet, Take 1 tablet (10 mg total) by mouth daily as needed for allergies., Disp: 30 tablet, Rfl: 0   metoprolol tartrate (LOPRESSOR) 25 MG tablet, Take 1 tablet (25 mg total) by mouth 2 (two) times daily., Disp: , Rfl:    midodrine (PROAMATINE) 5 MG tablet, Take Monday, Wednesday, Friday morning BEFORE dialysis, Disp: 48 tablet, Rfl: 6   multivitamin (RENA-VIT) TABS tablet, Take 1 tablet  by mouth daily., Disp: , Rfl:    nitroGLYCERIN (NITROSTAT) 0.4 MG SL tablet, Place 1 tablet (0.4 mg total) under the tongue every 5 (five) minutes x 3 doses as needed for chest pain (if no relief after 2nd dose, proceed to the ED for an evaluation or call 911)., Disp: 25 tablet, Rfl: 3   omeprazole (PRILOSEC) 20 MG capsule, Take 1 capsule (20 mg total) by mouth daily., Disp: 90 capsule, Rfl: 3   sevelamer carbonate (RENVELA) 800 MG tablet, Take 2 tablets (1,600 mg total) by mouth 3 (three) times daily with meals., Disp: 180 tablet, Rfl: 1   simvastatin (ZOCOR) 20 MG tablet, Take 20 mg by mouth  daily., Disp: , Rfl:    triamcinolone (KENALOG) 0.025 % ointment, Apply 1 application topically 2 (two) times daily as needed (itching)., Disp: , Rfl:    triamcinolone cream (KENALOG) 0.1 %, Apply 1 application topically 2 (two) times daily as needed (itching)., Disp: , Rfl:    zinc sulfate 220 (50 Zn) MG capsule, Take 1 capsule (220 mg total) by mouth daily., Disp: 30 capsule, Rfl: 0 Allergies: Aspirin, Penicillins, Amlodipine, Bactrim [sulfamethoxazole-trimethoprim], Contrast media [iodinated diagnostic agents], Iron, Nitrofurantoin, Tylenol [acetaminophen], Gabapentin, Hydralazine, Levofloxacin, No known allergies, Ranexa [ranolazine], Dexilant [dexlansoprazole], Levaquin [levofloxacin in d5w], Morphine and related, Plavix [clopidogrel bisulfate], Protonix [pantoprazole sodium], and Venofer [ferric oxide]  Complete Review of Systems: GENERAL: negative for malaise, night sweats HEENT: No changes in hearing or vision, no nose bleeds or other nasal problems. NECK: Negative for lumps, goiter, pain and significant neck swelling RESPIRATORY: Negative for cough, wheezing CARDIOVASCULAR: Negative for chest pain, leg swelling, palpitations, orthopnea GI: SEE HPI MUSCULOSKELETAL: Negative for joint pain or swelling, back pain, and muscle pain. SKIN: Negative for lesions, rash PSYCH: Negative for sleep disturbance,  mood disorder and recent psychosocial stressors. HEMATOLOGY Negative for prolonged bleeding, bruising easily, and swollen nodes. ENDOCRINE: Negative for cold or heat intolerance, polyuria, polydipsia and goiter. NEURO: negative for tremor, gait imbalance, syncope and seizures. The remainder of the review of systems is noncontributory.  Physical Exam: BP (!) 151/42   Pulse (!) 51   Temp 98.6 F (37 C) (Oral)   Resp (!) 22   Ht 5' 1"  (1.549 m)   Wt 47.6 kg   SpO2 92%   BMI 19.84 kg/m  GENERAL: The patient is AO x3, in no acute distress. Elder, frail. Looks fatigued. HEENT: Head is normocephalic and atraumatic. EOMI are intact. Mouth is well hydrated and without lesions. NECK: Supple. No masses LUNGS: Clear to auscultation. No presence of rhonchi/wheezing/rales. Adequate chest expansion HEART: RRR, normal s1 and s2. ABDOMEN: Soft, nontender, no guarding, no peritoneal signs, and nondistended. BS +. No masses. EXTREMITIES: Without any cyanosis, clubbing, rash, lesions or edema. NEUROLOGIC: AOx3, no focal motor deficit. SKIN: no jaundice, no rashes  Laboratory Data CBC:     Component Value Date/Time   WBC 4.4 02/27/2021 0311   RBC 1.64 (L) 02/27/2021 0311   HGB 5.8 (LL) 02/27/2021 0311   HCT 19.2 (L) 02/27/2021 0311   PLT 202 02/27/2021 0311   MCV 117.1 (H) 02/27/2021 0311   MCH 35.4 (H) 02/27/2021 0311   MCHC 30.2 02/27/2021 0311   RDW 18.9 (H) 02/27/2021 0311   LYMPHSABS 0.9 02/27/2021 0311   MONOABS 0.4 02/27/2021 0311   EOSABS 0.3 02/27/2021 0311   BASOSABS 0.1 02/27/2021 0311   COAG:  Lab Results  Component Value Date   INR 1.0 02/03/2021   INR 1.1 06/12/2020   INR 1.1 03/08/2019    BMP:  BMP Latest Ref Rng & Units 02/27/2021 02/27/2021 02/07/2021  Glucose 70 - 99 mg/dL 71 92 84  BUN 8 - 23 mg/dL 10 9 41(H)  Creatinine 0.44 - 1.00 mg/dL 3.64(H) 3.28(H) 6.09(H)  BUN/Creat Ratio 6 - 22 (calc) - - -  Sodium 135 - 145 mmol/L 132(L) 130(L) 135  Potassium 3.5 - 5.1  mmol/L 3.9 3.4(L) 3.3(L)  Chloride 98 - 111 mmol/L 95(L) 95(L) 95(L)  CO2 22 - 32 mmol/L 31 30 27   Calcium 8.9 - 10.3 mg/dL 8.8(L) 8.0(L) 9.0    HEPATIC:  Hepatic Function Latest Ref Rng & Units 02/27/2021 02/27/2021 02/07/2021  Total Protein  6.5 - 8.1 g/dL 6.0(L) 5.4(L) 6.1(L)  Albumin 3.5 - 5.0 g/dL 2.8(L) 2.5(L) 3.1(L)  AST 15 - 41 U/L 12(L) 9(L) 54(H)  ALT 0 - 44 U/L 12 10 57(H)  Alk Phosphatase 38 - 126 U/L 63 56 62  Total Bilirubin 0.3 - 1.2 mg/dL 0.3 0.5 0.5  Bilirubin, Direct 0.0 - 0.2 mg/dL - - -    CARDIAC:  Lab Results  Component Value Date   CKTOTAL 47 12/03/2020   CKMB 1.5 05/19/2016   TROPONINI 0.37 ([place]) 12/26/2018     Imaging: I personally reviewed and interpreted the available imaging.  Assessment & Plan: Shawna [name] is a 80 y.o. female with history of few AVMs in the small bowel, coronary artery disease status post CABG, anxiety, arthritis, carotid artery disease, chronic diastolic CHF, end-stage renal disease on hemodialysis, GERD, hyperlipidemia, hypertension, paroxysmal atrial fibrillation, TIA, history of ovarian cancer, who came to the hospital after she presented worsening fatigue.  Gastroenterology was consulted as she presented anemia.   The patient has presented recurrent episodes of anemia in the past but no presence of overt gastrointestinal bleeding.  Notably, upon review of her labs, they noticed she does not have evidence of any iron deficiency even while not taking iron supplementation.  Due to this, I do not consider there is a role for any endoscopic evaluation at this moment.  She had evidence of AVMs in her jejunum but they were very few and unlikely leading to her current presentation.  Furthermore, her FOBT was negative. She should get transfused and proceed with further evaluation by hematology as outpatient, although most of her anemia is likely related to her end-stage renal disease.  - Proceed with blood transfusion - Will recommend further  evaluation by hematology as outpatient - GI service will sign-off, please call us back if you have any more questions.  Harvel Quale, MD Gastroenterology and Hepatology [facility]

## 2021-02-27 NOTE — ED Provider Notes (Signed)
Emergency Department Provider Note   I have reviewed the triage vital signs and the nursing notes.   HISTORY  Chief Complaint Chest Pain   HPI Shawna [name] is a 80 y.o. female with PMH of CAD, ESRD on HD, AVMs, PAF, and HTN presents to the ED with CP. Pain is in the left chest and heavy. Symptoms are intermittent with no clear provoking factors. Patient sometimes has radiation into the left arm. No active pain. She notes that when she takes her home nitroglycerine the pain resolves almost immediately. Denies any fever, chills, SOB, diaphoresis. Her cardiologist is Dr. Harl Bowie. Patient notes that CP feels similar to prior CP episodes.   Past Medical History:  Diagnosis Date   Acute on chronic respiratory failure with hypoxia ([place]) 10/10/2016   Anxiety    Arthritis    AVM (arteriovenous malformation) of colon    CAD (coronary artery disease)    a. s/p CABG in 2013 b. DES to D1 in 10/2016. c. cath in 07/2018 showing patent grafts with occlusion of D1 at prior stent site and progression of PDA disease --> medical management recommended   Carotid artery disease ([place])    a. 09-47% LICA, 0/9628    Chronic anemia    Chronic bronchitis (HCC)    Chronic diastolic CHF (congestive heart failure) ([place])    a. 02/2012 Echo EF 60-65%, nl wall motion, Gr 1 DD, mod MR   Colon cancer ([place]) 1992   Esophageal stricture    ESRD on hemodialysis ([place])    ESRD due to HTN, started dialysis 2011 and gets HD at [facility] with Dr Hinda Lenis on MWF schedule.  Access is LUA AVF as of Sept 2014.    GERD (gastroesophageal reflux disease)    High cholesterol 12/2011   History of blood transfusion 07/2011; 12/2011; 01/2012 X 2; 04/2012   History of gout    History of lower GI bleeding    Hypertension    Iron deficiency anemia    Mitral regurgitation    a. Moderate by echo, 02/2012   Myocardial infarction [facility])    NSVT (nonsustained ventricular tachycardia) ([place])    Ovarian cancer ([place]) 1992   PAF (paroxysmal  atrial fibrillation) ([place])    Pneumonia ~ 2009   PUD (peptic ulcer disease)    TIA (transient ischemic attack)     Patient Active Problem List   Diagnosis Date Noted   Symptomatic anemia 02/27/2021   COVID-19 virus infection 02/03/2021   Leukocytosis 02/03/2021   Elevated MCV 02/03/2021   Hypoalbuminemia due to protein-calorie malnutrition ([place]) 02/03/2021   Hypothyroidism (acquired) 02/03/2021   Myositis 12/03/2020   Ataxia 12/02/2020   Diabetes mellitus type 2 in nonobese ([place]) 11/10/2020   Acute pulmonary edema ([place]) 11/10/2020   Generalized weakness 11/09/2020   Dialysis AV fistula malfunction, initial encounter ([place])    Jugular vein occlusion, right (HCC)    Failure of surgically constructed arteriovenous fistula ([place]) 10/03/2020   Myoclonus 08/31/2020   Clotted renal dialysis AV graft, initial encounter ([place])    Hypertensive heart and chronic kidney disease with heart failure and stage 1 through stage 4 chronic kidney disease, or chronic kidney disease ([place])    Hemodialysis-associated hypotension    Acute hypoxemic respiratory failure ([place]) 06/14/2020   Hypertension 06/12/2020   Irritable bowel syndrome 02/25/2020   Adenomatous duodenal polyp 09/10/2019   History of GI bleed 09/10/2019   Angina pectoris ([place]) 06/05/2019   Chest pain 06/03/2019   Small intestinal bacterial overgrowth  05/14/2019   Iron deficiency anemia 04/02/2019   GI bleed 03/08/2019   Gastrointestinal hemorrhage with melena 03/06/2019   Acute respiratory failure with hypoxia (HCC) 12/25/2018   Elevated troponin 12/14/2018   Chest pain at rest 07/13/2018   Hand steal syndrome ([place]) 08/01/2017   Anemia 07/14/2017   Coronary artery disease 06/05/2017   Mesenteric ischemia (HCC)    Diverticulitis    Enteritis    Complication of vascular access for dialysis 03/19/2017   Preoperative clearance 01/25/2017   H/O non-ST elevation myocardial infarction (NSTEMI) 10/24/2016   Fluid overload 88/50/2774    Complication from renal dialysis device 10/10/2016   Non-ST elevation MI (NSTEMI) ([place])    Encounter for fitting and adjustment of vascular catheter    Heme positive stool    Demand ischemia ([place]) 07/27/2016   Hypertensive emergency 07/08/2016   Acute on chronic respiratory failure with hypoxia [facility])    Cardiac arrest [facility])    Palliative care encounter    Goals of care, counseling/discussion    Hypertensive crisis without congestive heart failure 05/09/2016   Flash pulmonary edema ([place]) 04/06/2016   Acute respiratory failure ([place]) 04/06/2016   Hypertensive crisis 01/27/2016   History of colon cancer 01/27/2016   History of ovarian cancer 01/27/2016   Hypertensive urgency 01/27/2016   Paroxysmal atrial fibrillation ([place]) 10/14/2015   Coronary angioplasty status 10/14/2015   Malignant neoplasm of right ovary ([place]) 10/14/2015   Narrow complex tachycardia ([place]) 09/08/2015   SVT (supraventricular tachycardia) ([place]) 09/08/2015   Influenza A 08/30/2015   Acute on chronic diastolic CHF (congestive heart failure) ([place]) 05/04/2015   Unstable angina ([place]) 05/03/2015   DOE (dyspnea on exertion)    Essential hypertension    Pain in joint, lower leg 08/14/2014   Dacryocystitis 05/29/2013   Chronic diastolic CHF (congestive heart failure) ([place]) 03/22/2013   GI bleeding 03/21/2013   Acute post-hemorrhagic anemia 03/21/2013   Occlusion and stenosis of carotid artery without mention of cerebral infarction 01/24/2013   Hx of CABG 07/05/2012   Carotid artery disease ([place]) 07/05/2012   Mitral regurgitation 06/12/2012   Pneumonia 06/09/2012   Non-STEMI (non-ST elevated myocardial infarction) ([place]) 06/08/2012   Ischemic chest pain ([name]) 03/01/2012   AVM (arteriovenous malformation) of small bowel, acquired 01/20/2012   GERD (gastroesophageal reflux disease) 01/09/2012   HLD (hyperlipidemia) 01/05/2012   Atherosclerotic heart disease of native coronary artery without angina pectoris 12/16/2011    Essential hypertension, benign 12/16/2011   ESRD on hemodialysis ([place]) 12/16/2011   Anxiety disorder 05/04/2011   Anemia in chronic kidney disease 04/29/2011   ESRD (end stage renal disease) on dialysis ([place]) 04/29/2011   Gout 04/29/2011   Hypertensive chronic kidney disease with stage 5 chronic kidney disease or end stage renal disease ([place]) 04/29/2011    Past Surgical History:  Procedure Laterality Date   A/V SHUNTOGRAM Left 03/19/2019   Procedure: A/V SHUNTOGRAM;  Surgeon: Katha Cabal, MD;  Location: [place] [facility];  Service: Cardiovascular;  Laterality: Left;   ABDOMINAL HYSTERECTOMY  1992   APPENDECTOMY  06/1990   AV FISTULA PLACEMENT  07/2009   left upper arm   AV FISTULA PLACEMENT Right 09/06/2016   Procedure: RIGHT FOREARM ARTERIOVENOUS (AV) GRAFT;  Surgeon: Elam Dutch, MD;  Location: [place];  Service: Vascular;  Laterality: Right;   AV FISTULA PLACEMENT N/A 02/24/2017   Procedure: INSERTION OF ARTERIOVENOUS (AV) GORE-TEX GRAFT ARM (BRACHIAL AXILLARY);  Surgeon: Katha Cabal, MD;  Location: [facility] OR[facility];  Service: Vascular;  Laterality:  N/A;   [place] Right 09/06/2016   Procedure: REMOVAL OF Right Arm ARTERIOVENOUS GORETEX GRAFT and Vein Patch angioplasty of brachial artery;  Surgeon: Angelia Mould, MD;  Location: [place];  Service: Vascular;  Laterality: Right;   BIOPSY  09/26/2019   Procedure: BIOPSY;  Surgeon: Rogene Houston, MD;  [facility];  Service: Endoscopy;;   COLON RESECTION  1992   COLON SURGERY     COLONOSCOPY N/A 03/09/2019   Procedure: COLONOSCOPY;  Surgeon: Rogene Houston, MD;  [facility];  Service: Endoscopy;  Laterality: N/A;   CORONARY ANGIOPLASTY WITH STENT PLACEMENT  12/15/11   "2"   CORONARY ANGIOPLASTY WITH STENT PLACEMENT  y/2013   "1; makes total of 3" (05/02/2012)   CORONARY ARTERY BYPASS GRAFT  06/13/2012   Procedure: CORONARY ARTERY BYPASS GRAFTING (CABG);  Surgeon: Grace Isaac, MD;  Location:  [place];  Service: Open Heart Surgery;  Laterality: N/A;  cabg x four;  using left internal mammary artery, and left leg greater saphenous vein harvested endoscopically   CORONARY STENT INTERVENTION N/A 10/13/2016   Procedure: Coronary Stent Intervention;  Surgeon: Troy Sine, MD;  Location: [place] CV [facility];  Service: Cardiovascular;  Laterality: N/A;   DIALYSIS/PERMA CATHETER REMOVAL N/A 04/18/2017   Procedure: DIALYSIS/PERMA CATHETER REMOVAL;  Surgeon: Katha Cabal, MD;  Location: [place] [facility];  Service: Cardiovascular;  Laterality: N/A;   DILATION AND CURETTAGE OF UTERUS     ESOPHAGOGASTRODUODENOSCOPY  01/20/2012   Procedure: ESOPHAGOGASTRODUODENOSCOPY (EGD);  Surgeon: Ladene Artist, MD,FACG;  Location: [facility];  Service: Endoscopy;  Laterality: N/A;   ESOPHAGOGASTRODUODENOSCOPY N/A 03/26/2013   Procedure: ESOPHAGOGASTRODUODENOSCOPY (EGD);  Surgeon: Irene Shipper, MD;  Location: [facility] END[facility]COPY;  Service: Endoscopy;  Laterality: N/A;   ESOPHAGOGASTRODUODENOSCOPY N/A 04/30/2015   Procedure: ESOPHAGOGASTRODUODENOSCOPY (EGD);  Surgeon: Rogene Houston, MD;  [facility];  Service: Endoscopy;  Laterality: N/A;  1pm - moved to 10/20 @ 1:10   ESOPHAGOGASTRODUODENOSCOPY N/A 07/29/2016   Procedure: ESOPHAGOGASTRODUODENOSCOPY (EGD);  Surgeon: Manus Gunning, MD;  Location: [place];  Service: Gastroenterology;  Laterality: N/A;  enteroscopy   ESOPHAGOGASTRODUODENOSCOPY N/A 09/26/2019   Procedure: ESOPHAGOGASTRODUODENOSCOPY (EGD);  Surgeon: Rogene Houston, MD;  [facility];  Service: Endoscopy;  Laterality: N/A;  1250   ESOPHAGOGASTRODUODENOSCOPY (EGD) WITH PROPOFOL N/A 02/05/2021   Procedure: ESOPHAGOGASTRODUODENOSCOPY (EGD) WITH PROPOFOL;  Surgeon: Eloise Harman, DO;  [facility];  Service: Endoscopy;  Laterality: N/A;   GIVENS CAPSULE STUDY N/A 03/07/2019   Procedure: GIVENS CAPSULE STUDY;  Surgeon: Rogene Houston, MD;  [facility];  Service: Endoscopy;  Laterality: N/A;  7:30   INSERTION OF DIALYSIS CATHETER N/A 10/05/2020   Procedure: ABORTED TUNNELED DIALYSIS CATHETER PLACEMENT RIGHT INTERNAL JUGULAR VEIN ;  Surgeon: Virl Cagey, MD;  Location: [facility];  Service: General;  Laterality: N/A;   INTRAOPERATIVE TRANSESOPHAGEAL ECHOCARDIOGRAM  06/13/2012   Procedure: INTRAOPERATIVE TRANSESOPHAGEAL ECHOCARDIOGRAM;  Surgeon: Grace Isaac, MD;  Location: [place];  Service: Open Heart Surgery;  Laterality: N/A;   IR DIALY SHUNT INTRO NEEDLE/INTRACATH INITIAL W/IMG LEFT Left 10/06/2020   IR FLUORO GUIDE CV LINE RIGHT  06/17/2020   IR GENERIC HISTORICAL  07/26/2016   IR FLUORO GUIDE CV LINE RIGHT 07/26/2016 Greggory Keen, MD [facility]   IR GENERIC HISTORICAL  07/26/2016   IR US GUIDE VASC ACCESS RIGHT 07/26/2016 Greggory Keen, MD [facility]   IR GENERIC HISTORICAL  08/02/2016   IR US GUIDE VASC ACCESS RIGHT 08/02/2016 Greggory Keen, MD [facility]   IR GENERIC HISTORICAL  08/02/2016   IR FLUORO GUIDE CV LINE RIGHT 08/02/2016 Greggory Keen, MD [facility]   IR RADIOLOGY PERIPHERAL GUIDED IV START  03/28/2017   IR REMOVAL TUN CV CATH W/O FL  08/11/2020   IR THROMBECTOMY AV FISTULA W/THROMBOLYSIS INC/SHUNT/IMG LEFT Left 06/17/2020   IR US GUIDE VASC ACCESS LEFT  06/17/2020   IR US GUIDE VASC ACCESS RIGHT  03/28/2017   IR US GUIDE VASC ACCESS RIGHT  06/17/2020   LEFT HEART CATH AND CORONARY ANGIOGRAPHY N/A 09/20/2016   Procedure: Left Heart Cath and Coronary Angiography;  Surgeon: [name], MD;  Location: [place] [facility];  Service: Cardiovascular;  Laterality: N/A;   LEFT HEART CATH AND CORS/GRAFTS ANGIOGRAPHY N/A 10/13/2016   Procedure: Left Heart Cath and Cors/Grafts Angiography;  Surgeon: Troy Sine, MD;  Location: [place] [facility];  Service: Cardiovascular;  Laterality: N/A;   LEFT HEART CATH AND CORS/GRAFTS ANGIOGRAPHY N/A 07/13/2018   Procedure: LEFT HEART CATH AND CORS/GRAFTS ANGIOGRAPHY;  Surgeon:  Martinique, Peter M, MD;  Location: [place] [facility];  Service: Cardiovascular;  Laterality: N/A;   LEFT HEART CATHETERIZATION WITH CORONARY ANGIOGRAM N/A 12/15/2011   Procedure: LEFT HEART CATHETERIZATION WITH CORONARY ANGIOGRAM;  Surgeon: Burnell Blanks, MD;  Location: [facility] CATH LAB;  Service: Cardiovascular;  Laterality: N/A;   LEFT HEART CATHETERIZATION WITH CORONARY ANGIOGRAM N/A 01/10/2012   Procedure: LEFT HEART CATHETERIZATION WITH CORONARY ANGIOGRAM;  Surgeon: Peter M Martinique, MD;  Location: [facility] CATH LAB;  Service: Cardiovascular;  Laterality: N/A;   LEFT HEART CATHETERIZATION WITH CORONARY ANGIOGRAM N/A 06/08/2012   Procedure: LEFT HEART CATHETERIZATION WITH CORONARY ANGIOGRAM;  Surgeon: Burnell Blanks, MD;  Location: [facility] CATH LAB;  Service: Cardiovascular;  Laterality: N/A;   LEFT HEART CATHETERIZATION WITH CORONARY/GRAFT ANGIOGRAM N/A 12/10/2013   Procedure: LEFT HEART CATHETERIZATION WITH Beatrix Fetters;  Surgeon: [name], MD;  Location: [facility] CATH LAB;  Service: Cardiovascular;  Laterality: N/A;   OVARY SURGERY     ovarian cancer   POLYPECTOMY  03/09/2019   Procedure: POLYPECTOMY;  Surgeon: Rogene Houston, MD;  [facility];  Service: Endoscopy;;  cecal    POLYPECTOMY N/A 09/26/2019   Procedure: DUODENAL POLYPECTOMY;  Surgeon: Rogene Houston, MD;  [facility];  Service: Endoscopy;  Laterality: N/A;   REVISION OF ARTERIOVENOUS GORETEX GRAFT N/A 02/24/2017   Procedure: REVISION OF ARTERIOVENOUS GORETEX GRAFT (RESECTION);  Surgeon: Katha Cabal, MD;  Location: [facility] ORS;  Service: Vascular;  Laterality: N/A;   REVISON OF ARTERIOVENOUS FISTULA Left 06/19/2020   Procedure: REVISION OF LEFT UPPER ARM AV GRAFT WITH INTERPOSITION JUMP GRAFT USING 6MM GORE LIMB;  Surgeon: Marty Heck, MD;  Location: [place];  Service: Vascular;  Laterality: Left;   SHUNTOGRAM N/A 10/15/2013   Procedure: Fistulogram;  Surgeon: Serafina Mitchell, MD;   Location: [facility] CATH LAB;  Service: Cardiovascular;  Laterality: N/A;   THROMBECTOMY / ARTERIOVENOUS GRAFT REVISION  2011   left upper arm   TUBAL LIGATION  1980's   UPPER EXTREMITY ANGIOGRAPHY Bilateral 12/06/2016   Procedure: Upper Extremity Angiography;  Surgeon: Katha Cabal, MD;  Location: [place] [facility];  Service: Cardiovascular;  Laterality: Bilateral;   UPPER EXTREMITY INTERVENTION Left 06/06/2017   Procedure: UPPER EXTREMITY INTERVENTION;  Surgeon: Katha Cabal, MD;  Location: [place] CV LAB;  Service: Cardiovascular;  Laterality: Left;    Allergies Aspirin, Penicillins, Amlodipine, Bactrim [sulfamethoxazole-trimethoprim], Contrast media [iodinated diagnostic agents], Iron, Nitrofurantoin, Tylenol [acetaminophen], Gabapentin, Hydralazine, Levofloxacin, No known allergies, Ranexa [ranolazine], Dexilant [dexlansoprazole], Levaquin [levofloxacin in d5w], Morphine and related, Plavix [clopidogrel bisulfate], Protonix [pantoprazole sodium], and Venofer [ferric oxide]  Family History  Problem Relation Age of Onset   Heart disease Mother        Heart Disease before age 110   Hyperlipidemia Mother    Hypertension Mother    Diabetes Mother    Heart attack Mother    Heart disease Father        Heart Disease before age 91   Hyperlipidemia Father    Hypertension Father    Diabetes Father    Diabetes Sister    Hypertension Sister    Diabetes Brother    Hyperlipidemia Brother    Heart attack Brother    Hypertension Sister    Heart attack Brother    Colon cancer Child 43   Other Other        noncontributory for early CAD   Esophageal cancer Neg Hx    Liver disease Neg Hx    Kidney disease Neg Hx    Colon polyps Neg Hx     Social History Social History   Tobacco Use   Smoking status: Never   Smokeless tobacco: Never  Vaping Use   Vaping Use: Never used  Substance Use Topics   Alcohol use: No    Alcohol/week: 0.0 standard drinks   Drug use: No     Review of Systems  Constitutional: No fever/chills Eyes: No visual changes. ENT: No sore throat. Cardiovascular: Positive intermittent chest pain. Respiratory: Denies shortness of breath. Gastrointestinal: No abdominal pain.  No nausea, no vomiting.  No diarrhea.  No constipation. Genitourinary: Negative for dysuria. Musculoskeletal: Negative for back pain. Skin: Negative for rash. Neurological: Negative for headaches, focal weakness or numbness.  10-point ROS otherwise negative.  ____________________________________________   PHYSICAL EXAM:  VITAL SIGNS: ED Triage Vitals  Enc Vitals Group     BP 02/27/21 0218 (!) 181/57     Pulse Rate 02/27/21 0218 81     Resp 02/27/21 0218 (!) 25     Temp 02/27/21 0218 98.6 F (37 C)     Temp Source 02/27/21 0218 Oral     SpO2 02/27/21 0218 99 %     Weight 02/27/21 0220 105 lb (47.6 kg)     Height 02/27/21 0220 5\' 1"  (1.549 m)   Constitutional: Alert and oriented. Well appearing and in no acute distress. Eyes: Conjunctivae are normal.  Head: Atraumatic. Nose: No congestion/rhinnorhea. Mouth/Throat: Mucous membranes are moist.   Neck: No stridor.  Cardiovascular: Normal rate, regular rhythm. Good peripheral circulation. Grossly normal heart sounds.   Respiratory: Normal respiratory effort.  No retractions. Lungs CTAB. Gastrointestinal: Soft and nontender. No distention.  Musculoskeletal: No lower extremity tenderness nor edema. No gross deformities of extremities. Neurologic:  Normal speech and language. No gross focal neurologic deficits are appreciated.  Skin:  Skin is warm, dry and intact. No rash noted.  ____________________________________________   LABS (all labs ordered are listed, but only abnormal results are displayed)  Labs Reviewed  COMPREHENSIVE METABOLIC PANEL - Abnormal; Notable for the following components:      Result Value   Sodium 130 (*)    Potassium 3.4 (*)    Chloride 95 (*)    Creatinine, Ser 3.28  (*)    Calcium 8.0 (*)    Total  Protein 5.4 (*)    Albumin 2.5 (*)    AST 9 (*)    GFR, Estimated 14 (*)    All other components within normal limits  CBC WITH DIFFERENTIAL/PLATELET - Abnormal; Notable for the following components:   RBC 1.64 (*)    Hemoglobin 5.8 (*)    HCT 19.2 (*)    MCV 117.1 (*)    MCH 35.4 (*)    RDW 18.9 (*)    All other components within normal limits  TROPONIN I (HIGH SENSITIVITY) - Abnormal; Notable for the following components:   Troponin I (High Sensitivity) 29 (*)    All other components within normal limits  RESP PANEL BY RT-PCR (FLU A&B, COVID) ARPGX2  LIPASE, BLOOD  POC OCCULT BLOOD, ED  TYPE AND SCREEN  PREPARE RBC (CROSSMATCH)  TROPONIN I (HIGH SENSITIVITY)   ____________________________________________  EKG   EKG Interpretation  Date/Time:  Saturday February 27 2021 02:16:59 EDT Ventricular Rate:  82 PR Interval:  164 QRS Duration: 140 QT Interval:  447 QTC Calculation: 523 R Axis:   30 Text Interpretation: Sinus rhythm Left bundle branch block No significant change since last tracing Confirmed by Nanda Quinton [phone]) on 02/27/2021 2:20:15 AM        ____________________________________________  RADIOLOGY  DG Chest Portable 1 View  Result Date: 02/27/2021 CLINICAL DATA:  Chest pain EXAM: PORTABLE CHEST 1 VIEW COMPARISON:  02/03/2021 FINDINGS: Lung volumes are small and pulmonary insufflation has diminished since prior examination. Superimposed perihilar interstitial pulmonary infiltrate has improved in the interval since prior examination. Small bilateral pleural effusions are present. No pneumothorax. Cardiac size is within normal limits. Coronary artery bypass grafting has been performed. No acute bone abnormality. IMPRESSION: Progressive pulmonary hypoinflation. Improving pulmonary edema, likely cardiogenic, now mild in severity. Electronically Signed   By: Fidela Salisbury M.D.   On: 02/27/2021 02:46     ____________________________________________   PROCEDURES  Procedure(s) performed:   Procedures  CRITICAL CARE Performed by: Margette Fast Total critical care time: 35 minutes Critical care time was exclusive of separately billable procedures and treating other patients. Critical care was necessary to treat or prevent imminent or life-threatening deterioration. Critical care was time spent personally by me on the following activities: development of treatment plan with patient and/or surrogate as well as nursing, discussions with consultants, evaluation of patient's response to treatment, examination of patient, obtaining history from patient or surrogate, ordering and performing treatments and interventions, ordering and review of laboratory studies, ordering and review of radiographic studies, pulse oximetry and re-evaluation of patient's condition.  Nanda Quinton, MD Emergency Medicine  ____________________________________________   INITIAL IMPRESSION / ASSESSMENT AND PLAN / ED COURSE  Pertinent labs & imaging results that were available during my care of the patient were reviewed by me and considered in my medical decision making (see chart for details).   Patient presents emergency department intermittent chest discomfort over the past several days.  Chest pain is responsive to nitroglycerin.  Vital signs show hypertension but no fever, hypoxemia.  No findings on exam or history to strongly suspect dissection or PE.   04:35 AM  Lab work coming back showing significant drop in hemoglobin out of 5.8. Patient denies any gross blood in the BMs but has history of GI bleeding and AVMs. Last admit in late July patient had an upper endoscopy. Results not known at this time. Will order 1 U PRBC and run over 2 hours w/ ESRD. Last HD was yesterday AM. No pulmonary  edema. No other lab indication for emergent HD. Will discuss admit with TRH.   Rectal exam performed with patient's verbal  consent and nurse chaperone.  Patient has brown, Hemoccult negative stool.   Discussed patient's case with TRH to request admission. Patient and family (if present) updated with plan. Care transferred to [facility] service.  I reviewed all nursing notes, vitals, pertinent old records, EKGs, labs, imaging (as available).  ____________________________________________  FINAL CLINICAL IMPRESSION(S) / ED DIAGNOSES  Final diagnoses:  Symptomatic anemia  Precordial chest pain    MEDICATIONS GIVEN DURING THIS VISIT:  Medications  0.9 %  sodium chloride infusion (has no administration in time range)    Note:  This document was prepared using Dragon voice recognition software and may include unintentional dictation errors.  Nanda Quinton, MD, [facility] Emergency Medicine    Shiza Thelen, [facility], MD 02/27/21 [phone]

## 2021-02-27 NOTE — ED Triage Notes (Signed)
Pt hee for c/o intermittent chest pain. States pain started yesterday but became worse tonight. Pt took 1 sl NTG pta and admits relief. Pain is located under L breat and is described at "tightness'. Pain radiates to upper back and L shoulder. Pt's last HD was yesterday.

## 2021-02-27 NOTE — Progress Notes (Signed)
Patient on floor.  Patient is only alert x 1, which is normal for her admissions.  Attempted to give patient night time medicine, she refused, stating she would not take it.  Explained to patient again that she was in the hospital, and needed to take medication for blood pressure.  Patient again refused, stating, "Quit telling me I am in the hospital." Will continue to monitor patient.

## 2021-02-28 DIAGNOSIS — R079 Chest pain, unspecified: Secondary | ICD-10-CM

## 2021-02-28 DIAGNOSIS — N186 End stage renal disease: Secondary | ICD-10-CM | POA: Diagnosis not present

## 2021-02-28 DIAGNOSIS — E876 Hypokalemia: Secondary | ICD-10-CM

## 2021-02-28 DIAGNOSIS — E871 Hypo-osmolality and hyponatremia: Secondary | ICD-10-CM

## 2021-02-28 DIAGNOSIS — E440 Moderate protein-calorie malnutrition: Secondary | ICD-10-CM

## 2021-02-28 DIAGNOSIS — Z992 Dependence on renal dialysis: Secondary | ICD-10-CM

## 2021-02-28 DIAGNOSIS — D649 Anemia, unspecified: Secondary | ICD-10-CM | POA: Diagnosis not present

## 2021-02-28 LAB — CBC
HCT: 28.5 % — ABNORMAL LOW (ref 36.0–46.0)
Hemoglobin: 8.9 g/dL — ABNORMAL LOW (ref 12.0–15.0)
MCH: 33.8 pg (ref 26.0–34.0)
MCHC: 31.2 g/dL (ref 30.0–36.0)
MCV: 108.4 fL — ABNORMAL HIGH (ref 80.0–100.0)
Platelets: 231 10*3/uL (ref 150–400)
RBC: 2.63 MIL/uL — ABNORMAL LOW (ref 3.87–5.11)
RDW: 22.5 % — ABNORMAL HIGH (ref 11.5–15.5)
WBC: 6.9 10*3/uL (ref 4.0–10.5)
nRBC: 0 % (ref 0.0–0.2)

## 2021-02-28 MED ORDER — BENZONATATE 100 MG PO CAPS: 100.0000 mg | ORAL_CAPSULE | Freq: Four times a day (QID) | ORAL | 0 refills | Status: DC | PRN

## 2021-02-28 NOTE — Progress Notes (Signed)
Patient has received one unit of blood per MD order.  Notified MD of blood being completed and also of patient refusal to take blood pressure medicine.  No new orders received at this time.  Will continue to monitor patient.

## 2021-02-28 NOTE — Discharge Summary (Signed)
Physician Discharge Summary  Shawna [name] WFU:[id number] DOB: 04/13/1941 DOA: 02/27/2021  PCP: [facility]  Admit date: 02/27/2021 Discharge date: 02/28/2021  Admitted From: Home Disposition: Home   Recommendations for Outpatient Follow-up:  Follow up with PCP in 1-2 weeks Repeat CBC intermittently. Use of EPO deferred to nephrology. Given 1u PRBCs during admission with improvement of hgb to 8.9 and resolution of chest discomfort.  If GI bleeding develops (none reported PTA or witnessed here, and negative FOBT)  Home Health: None new Equipment/Devices: None new Discharge Condition: Stable CODE STATUS: Full Diet recommendation: Renal  Brief/Interim Summary: Shawna [name] is an 80 y.o. female with a history of colonic AVMs, ESRD, chronic anemia, dementia, CAD, PAF not on anticoagulation, and HTN who presented to the ED with complaints of chest pain relieved by NTG and exhaustion. She was afebrile with stable vital signs, Hgb 5.8g/dl from a baseline of ~8-9g/dl. BUN was 9, troponins were 29 and 32 without new ischemic ECG features, and chest pain has resolved while in the ED. FOBT negative. Admission requested for symptomatic anemia with 1u PRBCs ordered earlier this morning.  Discharge Diagnoses:  Principal Problem:   Symptomatic anemia Active Problems:   ESRD on hemodialysis (HCC)   Hyponatremia   Chest pain   Moderate protein-calorie malnutrition (HCC)   Hypokalemia  Symptomatic anemia:  - 1u PRBCs ordered in ED but delayed due to presence of antibodies. After receiving this overnight, hgb up to 8.9g/dl and symptoms resolved.   - With ESRD to explain progressive anemia in the absence of clinical bleeding and negative FOBT/BUN, no urgent GI evaluation is currently necessary. D/w Dr. Jenetta Downer.    Chest pain: Suspect stable angina, known to cardiology, Dr. Harl Bowie. Troponin relatively low and flat, especially in light of anuric ESRD, and stable ECG is reassuring.  -  Continue home beta blocker, imdur and ASA - NTG prn.    ESRD: HD has been on schedule (MWF in [facility] in [place], last 8/19).  - Unclear whether she's been getting ESA or not. Iron stores appear adequate as mentioned above.  - Routine HD due 8/22.  Discharge Instructions Discharge Instructions     Discharge instructions   Complete by: As directed    You were admitted for symptomatic anemia which has improved with blood transfusion. Your symptoms have resolved and you are stable for discharge. You will need to follow up with dialysis tomorrow and return to the hospital if your symptoms return or you notice bleeding.  you may take tessalon perles as needed for cough.      Allergies as of 02/28/2021       Reactions   Aspirin Other (See Comments)   High Doses Mess up her stomach; "makes my bowels have blood in them". Takes 81 mg EC Aspirin    Penicillins Other (See Comments)   SYNCOPE? , "makes me real weak when I take it; like I'll pass out" Has patient had a PCN reaction causing immediate rash, facial/tongue/throat swelling, SOB or lightheadedness with hypotension: Yes Has patient had a PCN reaction causing severe rash involving mucus membranes or skin necrosis: no Has patient had a PCN reaction that required hospitalization no Has patient had a PCN reaction occurring within the last 10 years: no If all of the above   Amlodipine Swelling   Bactrim [sulfamethoxazole-trimethoprim] Rash   Contrast Media [iodinated Diagnostic Agents] Itching   Iron Itching, Other (See Comments)   "they gave me iron in dialysis; had to give me  Benadryl cause I had to have the iron" (05/02/2012)   Nitrofurantoin Hives   Tylenol [acetaminophen] Itching, Other (See Comments)   Makes her feet on fire per pt   Gabapentin Other (See Comments)   Unknown reaction   Hydralazine Itching   Has tolerated while inpatient   Levofloxacin    No Known Allergies    Ranexa [ranolazine] Other (See Comments)    Myoclonus-hospitalized    Dexilant [dexlansoprazole] Other (See Comments)   Upset stomach   Levaquin [levofloxacin In D5w] Rash   Morphine And Related Itching, Other (See Comments)   Itching in feet   Plavix [clopidogrel Bisulfate] Rash   Protonix [pantoprazole Sodium] Rash   Venofer [ferric Oxide] Itching, Other (See Comments)   Patient reports using Benadryl prior to doses as [place]        Medication List     STOP taking these medications    ascorbic acid 500 MG tablet Commonly known as: VITAMIN C   cyclobenzaprine 5 MG tablet Commonly known as: FLEXERIL   guaiFENesin-dextromethorphan 100-10 MG/5ML syrup Commonly known as: ROBITUSSIN DM   triamcinolone 0.025 % ointment Commonly known as: KENALOG   triamcinolone cream 0.1 % Commonly known as: KENALOG   zinc sulfate 220 (50 Zn) MG capsule       TAKE these medications    aspirin EC 81 MG tablet Take 81 mg by mouth daily.   atorvastatin 10 MG tablet Commonly known as: LIPITOR Take 10 mg by mouth daily.   benzonatate 100 MG capsule Commonly known as: Tessalon Perles Take 1 capsule (100 mg total) by mouth every 6 (six) hours as needed for cough.   fluticasone 50 MCG/ACT nasal spray Commonly known as: FLONASE Place 1 spray into both nostrils at bedtime as needed for allergies.   hydrOXYzine 100 MG capsule Commonly known as: VISTARIL Take 100 mg by mouth daily as needed for anxiety or itching.   isosorbide mononitrate 60 MG 24 hr tablet Commonly known as: IMDUR Take 1 tablet (60 mg total) by mouth in the morning and at bedtime.   levothyroxine 25 MCG tablet Commonly known as: SYNTHROID Take 1 tablet (25 mcg total) by mouth daily.   lidocaine-prilocaine cream Commonly known as: EMLA Apply 1 application topically every Monday, Wednesday, and Friday. Prior to dialysis   loperamide 2 MG tablet Commonly known as: IMODIUM A-D Take 2 tablets (4 mg total) by mouth daily as needed for diarrhea or  loose stools.   loratadine 10 MG tablet Commonly known as: CLARITIN Take 1 tablet (10 mg total) by mouth daily as needed for allergies.   metoprolol tartrate 25 MG tablet Commonly known as: LOPRESSOR Take 1 tablet (25 mg total) by mouth 2 (two) times daily.   midodrine 5 MG tablet Commonly known as: PROAMATINE Take Monday, Wednesday, Friday morning BEFORE dialysis   multivitamin Tabs tablet Take 1 tablet by mouth daily.   nitroGLYCERIN 0.4 MG SL tablet Commonly known as: NITROSTAT Place 1 tablet (0.4 mg total) under the tongue every 5 (five) minutes x 3 doses as needed for chest pain (if no relief after 2nd dose, proceed to the ED for an evaluation or call 911).   omeprazole 20 MG capsule Commonly known as: PRILOSEC Take 1 capsule (20 mg total) by mouth daily.   sevelamer carbonate 800 MG tablet Commonly known as: RENVELA Take 2 tablets (1,600 mg total) by mouth 3 (three) times daily with meals.   simvastatin 20 MG tablet Commonly known as: ZOCOR Take  20 mg by mouth daily.        Follow-up Information     [facility] Follow up.   Contact information: [place] [address] [place], [place] Follow up.   Contact information: [place] [address] [phone]         Fran Lowes, MD .   Specialty: Nephrology               Allergies  Allergen Reactions   Aspirin Other (See Comments)    High Doses Mess up her stomach; "makes my bowels have blood in them". Takes 81 mg EC Aspirin    Penicillins Other (See Comments)    SYNCOPE? , "makes me real weak when I take it; like I'll pass out"  Has patient had a PCN reaction causing immediate rash, facial/tongue/throat swelling, SOB or lightheadedness with hypotension: Yes Has patient had a PCN reaction causing severe rash involving mucus membranes or skin necrosis: no Has patient had a PCN reaction that required  hospitalization no Has patient had a PCN reaction occurring within the last 10 years: no If all of the above   Amlodipine Swelling   Bactrim [Sulfamethoxazole-Trimethoprim] Rash   Contrast Media [Iodinated Diagnostic Agents] Itching   Iron Itching and Other (See Comments)    "they gave me iron in dialysis; had to give me Benadryl cause I had to have the iron" (05/02/2012)   Nitrofurantoin Hives   Tylenol [Acetaminophen] Itching and Other (See Comments)    Makes her feet on fire per pt   Gabapentin Other (See Comments)    Unknown reaction   Hydralazine Itching    Has tolerated while inpatient   Levofloxacin    No Known Allergies    Ranexa [Ranolazine] Other (See Comments)    Myoclonus-hospitalized    Dexilant [Dexlansoprazole] Other (See Comments)    Upset stomach   Levaquin [Levofloxacin In D5w] Rash   Morphine And Related Itching and Other (See Comments)    Itching in feet   Plavix [Clopidogrel Bisulfate] Rash   Protonix [Pantoprazole Sodium] Rash   Venofer [Ferric Oxide] Itching and Other (See Comments)    Patient reports using Benadryl prior to doses as [place]    Consultations: GI, Dr. Jenetta Downer  Procedures/Studies: DG Chest Portable 1 View  Result Date: 02/27/2021 CLINICAL DATA:  Chest pain EXAM: PORTABLE CHEST 1 VIEW COMPARISON:  02/03/2021 FINDINGS: Lung volumes are small and pulmonary insufflation has diminished since prior examination. Superimposed perihilar interstitial pulmonary infiltrate has improved in the interval since prior examination. Small bilateral pleural effusions are present. No pneumothorax. Cardiac size is within normal limits. Coronary artery bypass grafting has been performed. No acute bone abnormality. IMPRESSION: Progressive pulmonary hypoinflation. Improving pulmonary edema, likely cardiogenic, now mild in severity. Electronically Signed   By: Fidela Salisbury M.D.   On: 02/27/2021 02:46   DG Chest Port 1 View  Result Date:  02/03/2021 CLINICAL DATA:  Shortness of breath.  Blood in stool.  Weakness. EXAM: PORTABLE CHEST 1 VIEW COMPARISON:  01/22/2021 FINDINGS: Mild enlargement of the cardiopericardial silhouette with pulmonary venous hypertension and interstitial accentuation. No blunting of the lateral costophrenic angles. Prior CABG. Atherosclerotic calcification of the aortic arch. Coronary stents noted. Left axillary stent noted. IMPRESSION: 1. Cardiomegaly with pulmonary venous hypertension and interstitial accentuation probably from interstitial edema. 2. Aortic Atherosclerosis (ICD10-I70.0). Electronically Signed   By: Cindra Eves.D.  On: 02/03/2021 19:44     Subjective: Feels fine. She's been grumpy with staff overnight and continues to be baseline confused. This morning she's pleasant with me stating she has no dyspnea or chest pain or other complaints. No bleeding reported by staff. She says she has intermittent cough with posttussive emesis and this has been controlled with tessalon in the past.   Discharge Exam: Vitals:   02/28/21 0240 02/28/21 0457  BP: (!) 176/68 (!) 180/64  Pulse: 70 78  Resp: 20 19  Temp: 98.5 F (36.9 C) 97.8 F (36.6 C)  SpO2:  100%   General: Pt is alert, awake, not in acute distress Cardiovascular: RRR, S1/S2 +, no rubs, no gallops Respiratory: CTA bilaterally, no wheezing, no rhonchi Abdominal: Soft, NT, ND, bowel sounds + Extremities: No edema, no cyanosis. +thrill LUA AVF  Labs: BNP (last 3 results) Recent Labs    09/29/20 0112  BNP 366.2*   Basic Metabolic Panel: Recent Labs  Lab 02/27/21 0311 02/27/21 0857  NA 130* 132*  K 3.4* 3.9  CL 95* 95*  CO2 30 31  GLUCOSE 92 71  BUN 9 10  CREATININE 3.28* 3.64*  CALCIUM 8.0* 8.8*   Liver Function Tests: Recent Labs  Lab 02/27/21 0311 02/27/21 0857  AST 9* 12*  ALT 10 12  ALKPHOS 56 63  BILITOT 0.5 0.3  PROT 5.4* 6.0*  ALBUMIN 2.5* 2.8*   Recent Labs  Lab 02/27/21 0311  LIPASE 26   No  results for input(s): AMMONIA in the last 168 hours. CBC: Recent Labs  Lab 02/27/21 0311 02/28/21 0015  WBC 4.4 6.9  NEUTROABS 2.8  --   HGB 5.8* 8.9*  HCT 19.2* 28.5*  MCV 117.1* 108.4*  PLT 202 231   Cardiac Enzymes: No results for input(s): CKTOTAL, CKMB, CKMBINDEX, TROPONINI in the last 168 hours. BNP: Invalid input(s): POCBNP CBG: Recent Labs  Lab 02/27/21 2159  GLUCAP 110*   D-Dimer No results for input(s): DDIMER in the last 72 hours. Hgb A1c No results for input(s): HGBA1C in the last 72 hours. Lipid Profile No results for input(s): CHOL, HDL, LDLCALC, TRIG, CHOLHDL, LDLDIRECT in the last 72 hours. Thyroid function studies No results for input(s): TSH, T4TOTAL, T3FREE, THYROIDAB in the last 72 hours.  Invalid input(s): FREET3 Anemia work up No results for input(s): VITAMINB12, FOLATE, FERRITIN, TIBC, IRON, RETICCTPCT in the last 72 hours. Urinalysis    Component Value Date/Time   COLORURINE YELLOW 12/16/2012 1919   APPEARANCEUR CLOUDY (A) 12/16/2012 1919   LABSPEC 1.009 12/16/2012 1919   PHURINE 7.5 12/16/2012 1919   GLUCOSEU NEGATIVE 12/16/2012 1919   HGBUR TRACE (A) 12/16/2012 1919   BILIRUBINUR NEGATIVE 12/16/2012 1919   KETONESUR NEGATIVE 12/16/2012 1919   PROTEINUR 100 (A) 12/16/2012 1919   UROBILINOGEN 0.2 12/16/2012 1919   NITRITE NEGATIVE 12/16/2012 1919   LEUKOCYTESUR SMALL (A) 12/16/2012 1919    Microbiology Recent Results (from the past 240 hour(s))  Resp Panel by RT-PCR (Flu A&B, Covid) Nasopharyngeal Swab     Status: None   Collection Time: 02/27/21  6:15 AM   Specimen: Nasopharyngeal Swab; Nasopharyngeal(NP) swabs in vial transport medium  Result Value Ref Range Status   SARS Coronavirus 2 by RT PCR NEGATIVE NEGATIVE Final    Comment: (NOTE) SARS-CoV-2 target nucleic acids are NOT DETECTED.  The SARS-CoV-2 RNA is generally detectable in upper respiratory specimens during the acute phase of infection. The lowest concentration of  SARS-CoV-2 viral copies this assay can detect is 138  copies/mL. A negative result does not preclude SARS-Cov-2 infection and should not be used as the sole basis for treatment or other patient management decisions. A negative result may occur with  improper specimen collection/handling, submission of specimen other than nasopharyngeal swab, presence of viral mutation(s) within the areas targeted by this assay, and inadequate number of viral copies(<138 copies/mL). A negative result must be combined with clinical observations, patient history, and epidemiological information. The expected result is Negative.  Fact Sheet for Patients:  EntrepreneurPulse.com.au  Fact Sheet for Healthcare Providers:  IncredibleEmployment.be  This test is no t yet approved or cleared by the [place] FDA and  has been authorized for detection and/or diagnosis of SARS-CoV-2 by FDA under an Emergency Use Authorization (EUA). This EUA will remain  in effect (meaning this test can be used) for the duration of the COVID-19 declaration under Section 564(b)(1) of the Act, 21 U.S.C.section 360bbb-3(b)(1), unless the authorization is terminated  or revoked sooner.       Influenza A by PCR NEGATIVE NEGATIVE Final   Influenza B by PCR NEGATIVE NEGATIVE Final    Comment: (NOTE) The Xpert Xpress SARS-CoV-2/FLU/RSV plus assay is intended as an aid in the diagnosis of influenza from Nasopharyngeal swab specimens and should not be used as a sole basis for treatment. Nasal washings and aspirates are unacceptable for Xpert Xpress SARS-CoV-2/FLU/RSV testing.  Fact Sheet for Patients: EntrepreneurPulse.com.au  Fact Sheet for Healthcare Providers: IncredibleEmployment.be  This test is not yet approved or cleared by the [place] FDA and has been authorized for detection and/or diagnosis of SARS-CoV-2 by FDA under an Emergency Use  Authorization (EUA). This EUA will remain in effect (meaning this test can be used) for the duration of the COVID-19 declaration under Section 564(b)(1) of the Act, 21 U.S.C. section 360bbb-3(b)(1), unless the authorization is terminated or revoked.  Performed at [facility], [address] [place], [place] [address]     Time coordinating discharge: Approximately 40 minutes  Patrecia Pour, MD  [facility] 02/28/2021, 8:19 AM

## 2021-03-01 LAB — BPAM RBC
Blood Product Expiration Date: 202209202359
Blood Product Expiration Date: 202209252359
ISSUE DATE / TIME: 202208201845
Unit Type and Rh: 5100
Unit Type and Rh: 5100

## 2021-03-01 LAB — TYPE AND SCREEN
ABO/RH(D): O POS
Antibody Screen: NEGATIVE
Unit division: 0
Unit division: 0

## 2021-03-02 ENCOUNTER — Ambulatory Visit: Payer: Medicare HMO | Admitting: Urology

## 2021-03-09 ENCOUNTER — Ambulatory Visit: Payer: Medicare HMO | Admitting: Urology

## 2021-03-12 ENCOUNTER — Emergency Department (HOSPITAL_COMMUNITY)
Admission: EM | Admit: 2021-03-12 | Discharge: 2021-03-12 | Disposition: A | Discharge status: Home / Self Care | Payer: Medicare HMO | Attending: Emergency Medicine | Admitting: Emergency Medicine

## 2021-03-12 ENCOUNTER — Other Ambulatory Visit: Payer: Self-pay

## 2021-03-12 ENCOUNTER — Emergency Department (HOSPITAL_COMMUNITY): Payer: Medicare HMO

## 2021-03-12 DIAGNOSIS — Z8543 Personal history of malignant neoplasm of ovary: Secondary | ICD-10-CM | POA: Insufficient documentation

## 2021-03-12 DIAGNOSIS — Z8616 Personal history of COVID-19: Secondary | ICD-10-CM | POA: Diagnosis not present

## 2021-03-12 DIAGNOSIS — I5033 Acute on chronic diastolic (congestive) heart failure: Secondary | ICD-10-CM | POA: Insufficient documentation

## 2021-03-12 DIAGNOSIS — N186 End stage renal disease: Secondary | ICD-10-CM | POA: Insufficient documentation

## 2021-03-12 DIAGNOSIS — Z992 Dependence on renal dialysis: Secondary | ICD-10-CM | POA: Diagnosis not present

## 2021-03-12 DIAGNOSIS — E1122 Type 2 diabetes mellitus with diabetic chronic kidney disease: Secondary | ICD-10-CM | POA: Diagnosis not present

## 2021-03-12 DIAGNOSIS — Z79899 Other long term (current) drug therapy: Secondary | ICD-10-CM | POA: Diagnosis not present

## 2021-03-12 DIAGNOSIS — Z7982 Long term (current) use of aspirin: Secondary | ICD-10-CM | POA: Diagnosis not present

## 2021-03-12 DIAGNOSIS — Z955 Presence of coronary angioplasty implant and graft: Secondary | ICD-10-CM | POA: Insufficient documentation

## 2021-03-12 DIAGNOSIS — E039 Hypothyroidism, unspecified: Secondary | ICD-10-CM | POA: Insufficient documentation

## 2021-03-12 DIAGNOSIS — R072 Precordial pain: Secondary | ICD-10-CM | POA: Insufficient documentation

## 2021-03-12 DIAGNOSIS — Z85038 Personal history of other malignant neoplasm of large intestine: Secondary | ICD-10-CM | POA: Diagnosis not present

## 2021-03-12 DIAGNOSIS — I132 Hypertensive heart and chronic kidney disease with heart failure and with stage 5 chronic kidney disease, or end stage renal disease: Secondary | ICD-10-CM | POA: Insufficient documentation

## 2021-03-12 DIAGNOSIS — I2510 Atherosclerotic heart disease of native coronary artery without angina pectoris: Secondary | ICD-10-CM | POA: Diagnosis not present

## 2021-03-12 DIAGNOSIS — M79605 Pain in left leg: Secondary | ICD-10-CM | POA: Insufficient documentation

## 2021-03-12 DIAGNOSIS — D631 Anemia in chronic kidney disease: Secondary | ICD-10-CM | POA: Diagnosis not present

## 2021-03-12 LAB — BASIC METABOLIC PANEL
Anion gap: 12 (ref 5–15)
BUN: 26 mg/dL — ABNORMAL HIGH (ref 8–23)
CO2: 29 mmol/L (ref 22–32)
Calcium: 9.3 mg/dL (ref 8.9–10.3)
Chloride: 100 mmol/L (ref 98–111)
Creatinine, Ser: 5.97 mg/dL — ABNORMAL HIGH (ref 0.44–1.00)
GFR, Estimated: 7 mL/min — ABNORMAL LOW (ref >60)
Glucose, Bld: 111 mg/dL — ABNORMAL HIGH (ref 70–99)
Potassium: 4.1 mmol/L (ref 3.5–5.1)
Sodium: 141 mmol/L (ref 135–145)

## 2021-03-12 LAB — CBC
HCT: 28.5 % — ABNORMAL LOW (ref 36.0–46.0)
Hemoglobin: 8.9 g/dL — ABNORMAL LOW (ref 12.0–15.0)
MCH: 34.8 pg — ABNORMAL HIGH (ref 26.0–34.0)
MCHC: 31.2 g/dL (ref 30.0–36.0)
MCV: 111.3 fL — ABNORMAL HIGH (ref 80.0–100.0)
Platelets: 313 10*3/uL (ref 150–400)
RBC: 2.56 MIL/uL — ABNORMAL LOW (ref 3.87–5.11)
RDW: 21.8 % — ABNORMAL HIGH (ref 11.5–15.5)
WBC: 7.4 10*3/uL (ref 4.0–10.5)
nRBC: 0 % (ref 0.0–0.2)

## 2021-03-12 LAB — TROPONIN I (HIGH SENSITIVITY)
Troponin I (High Sensitivity): 43 ng/L — ABNORMAL HIGH (ref <18)
Troponin I (High Sensitivity): 56 ng/L — ABNORMAL HIGH (ref <18)
Troponin I (High Sensitivity): 4 ng/L (ref <18)

## 2021-03-12 IMAGING — DX DG KNEE COMPLETE 4+V*L*
4 series · 4 of 4 positions shown · non-contrast
Comparison: None.

CLINICAL DATA: Fall.  Knee pain.

EXAM:
LEFT KNEE - COMPLETE 4+ VIEW

[knee ap]
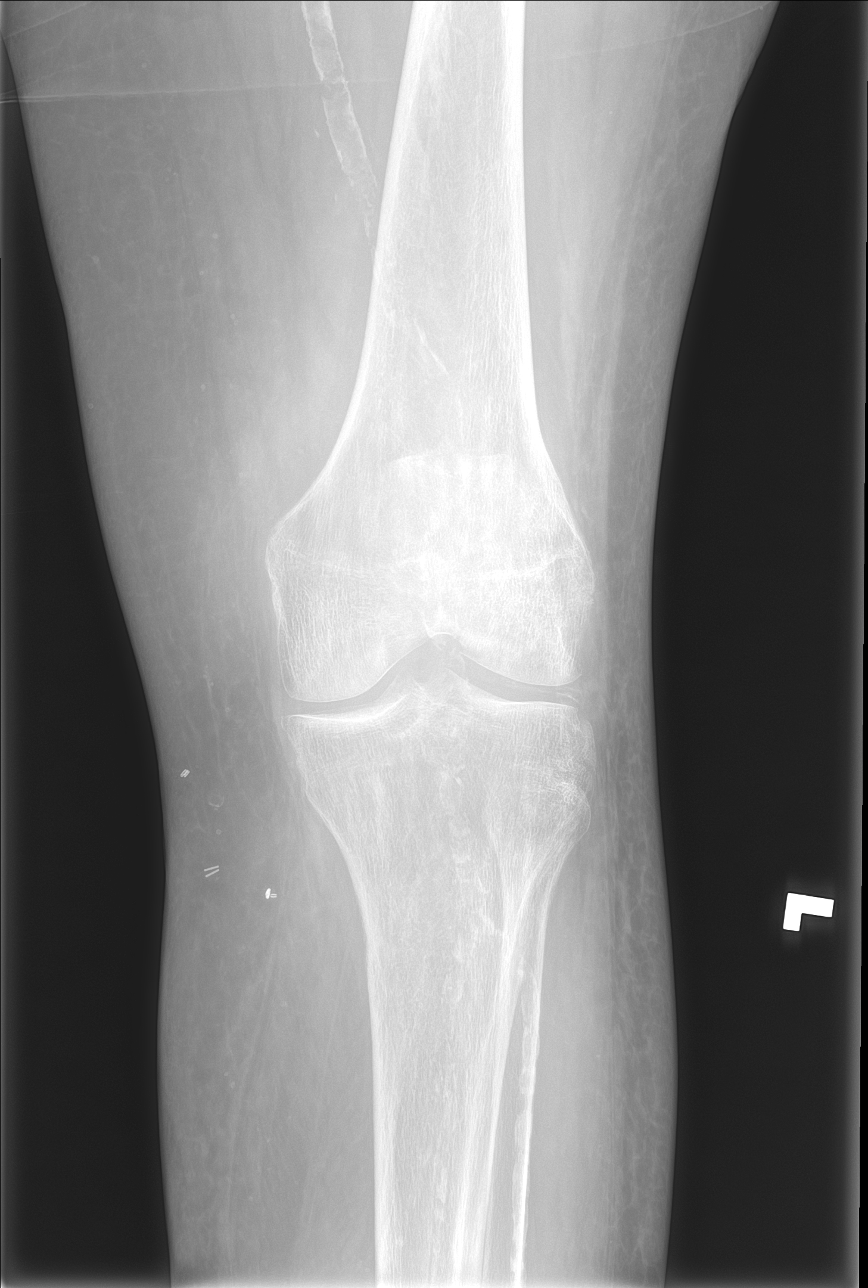

[knee obl (1 of 2)]
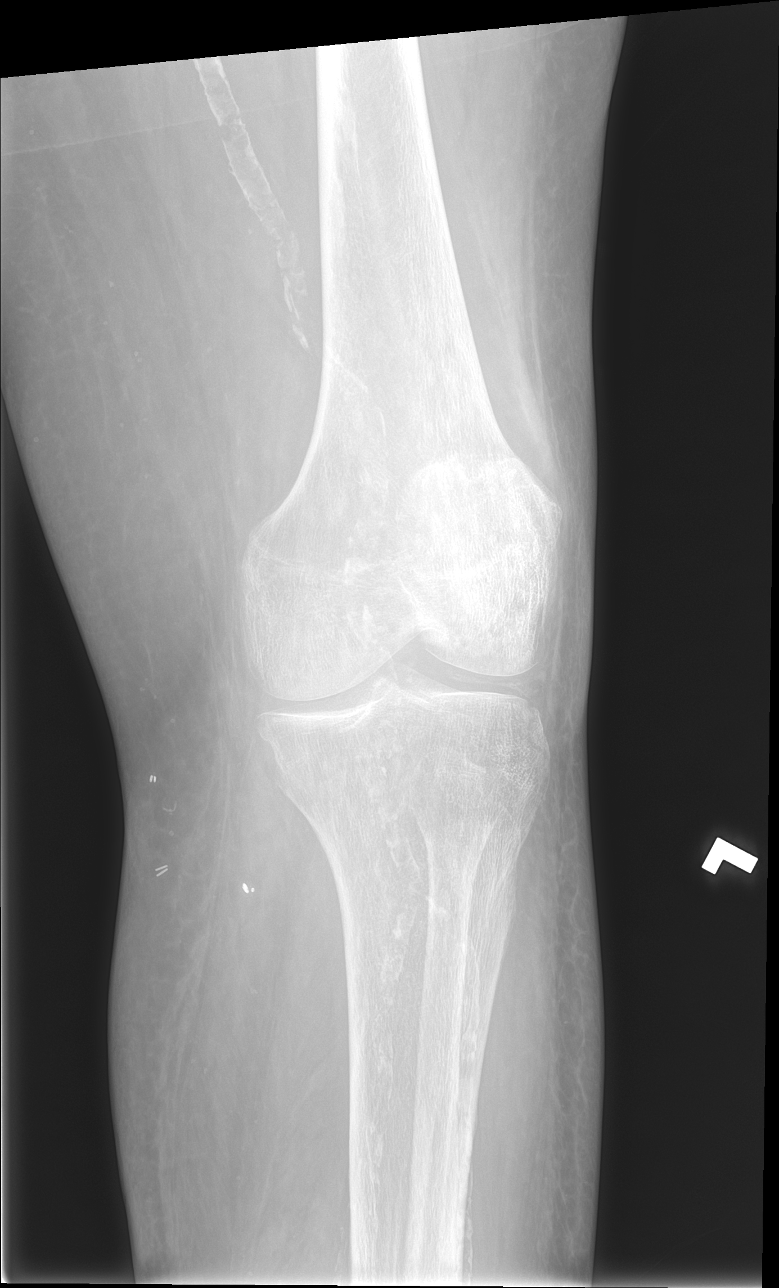

[knee obl (2 of 2)]
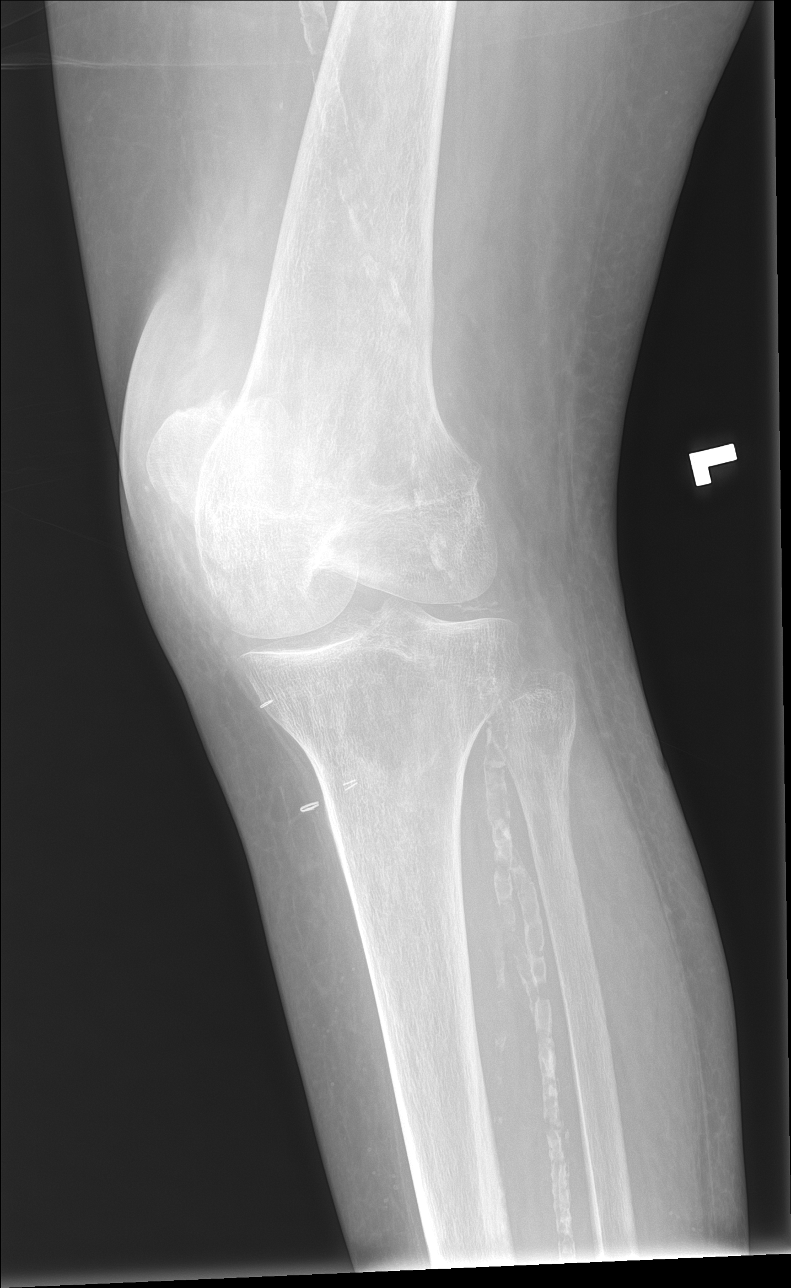

[knee lat]
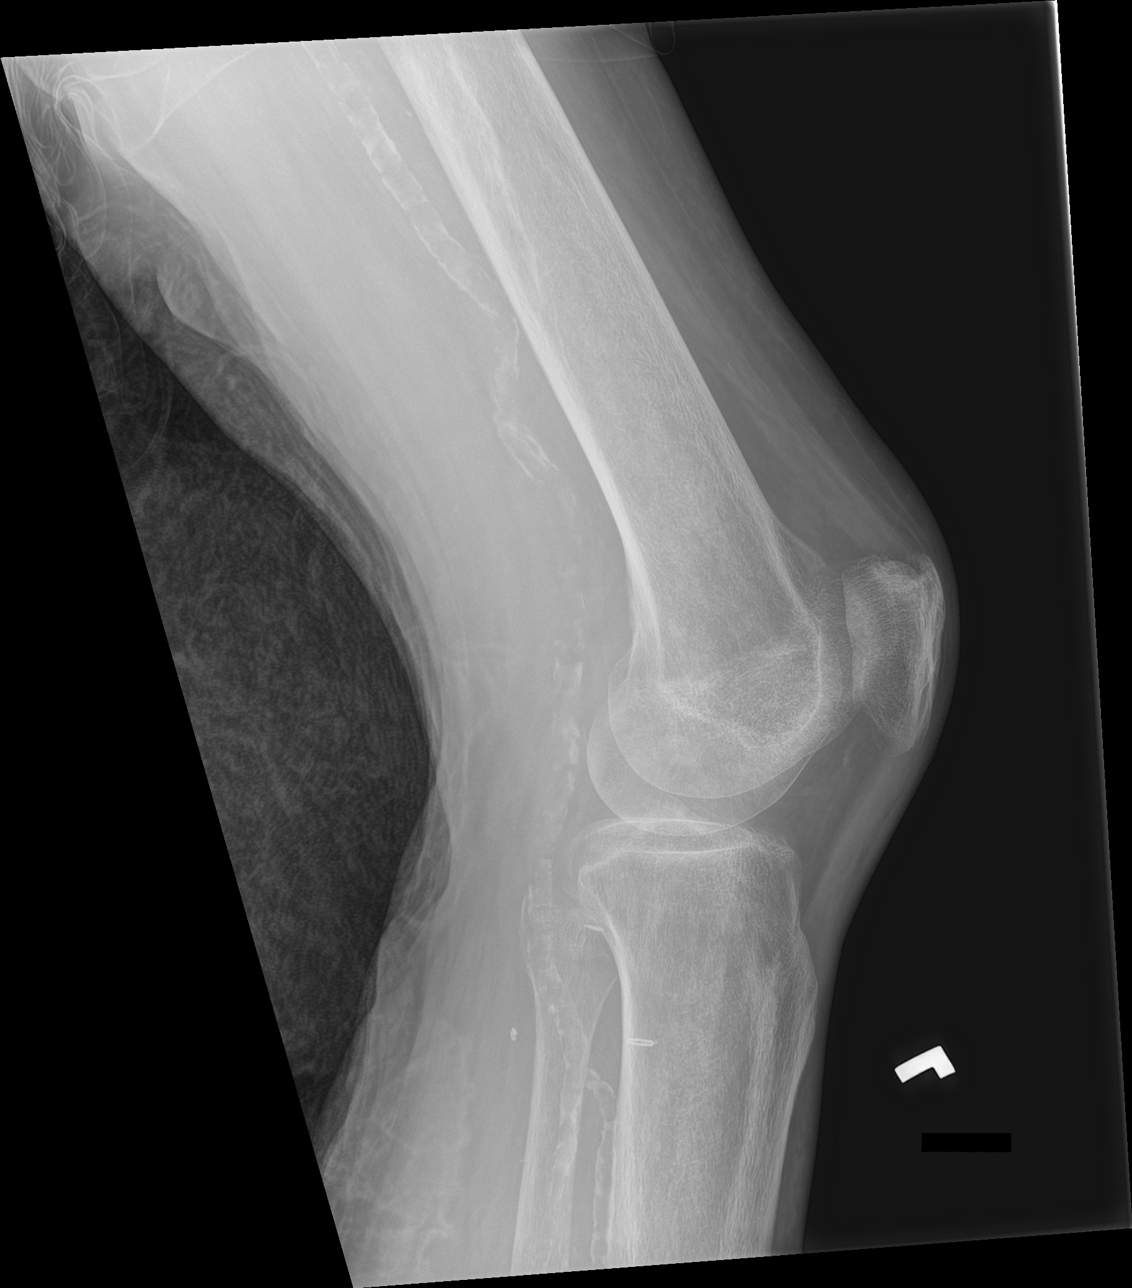

[4 of 4 positions shown; findings below may reference images not displayed]

FINDINGS: No evidence for an acute fracture. No subluxation or dislocation. No
joint effusion. Bones are demineralized. Surgical clips are noted in
the medial soft tissues adjacent to the proximal tibia. Meniscal
calcification evident.
IMPRESSION: 1. No acute bony abnormality.
2. Meniscal calcification. Chondrocalcinosis can be associated with
CPPD.

## 2021-03-12 IMAGING — DX DG CHEST 2V
2 series · 2 of 2 positions shown · non-contrast
Comparison: [DATE]

CLINICAL DATA: Fall with pain in the chest

EXAM:
CHEST - 2 VIEW

[chest lat]
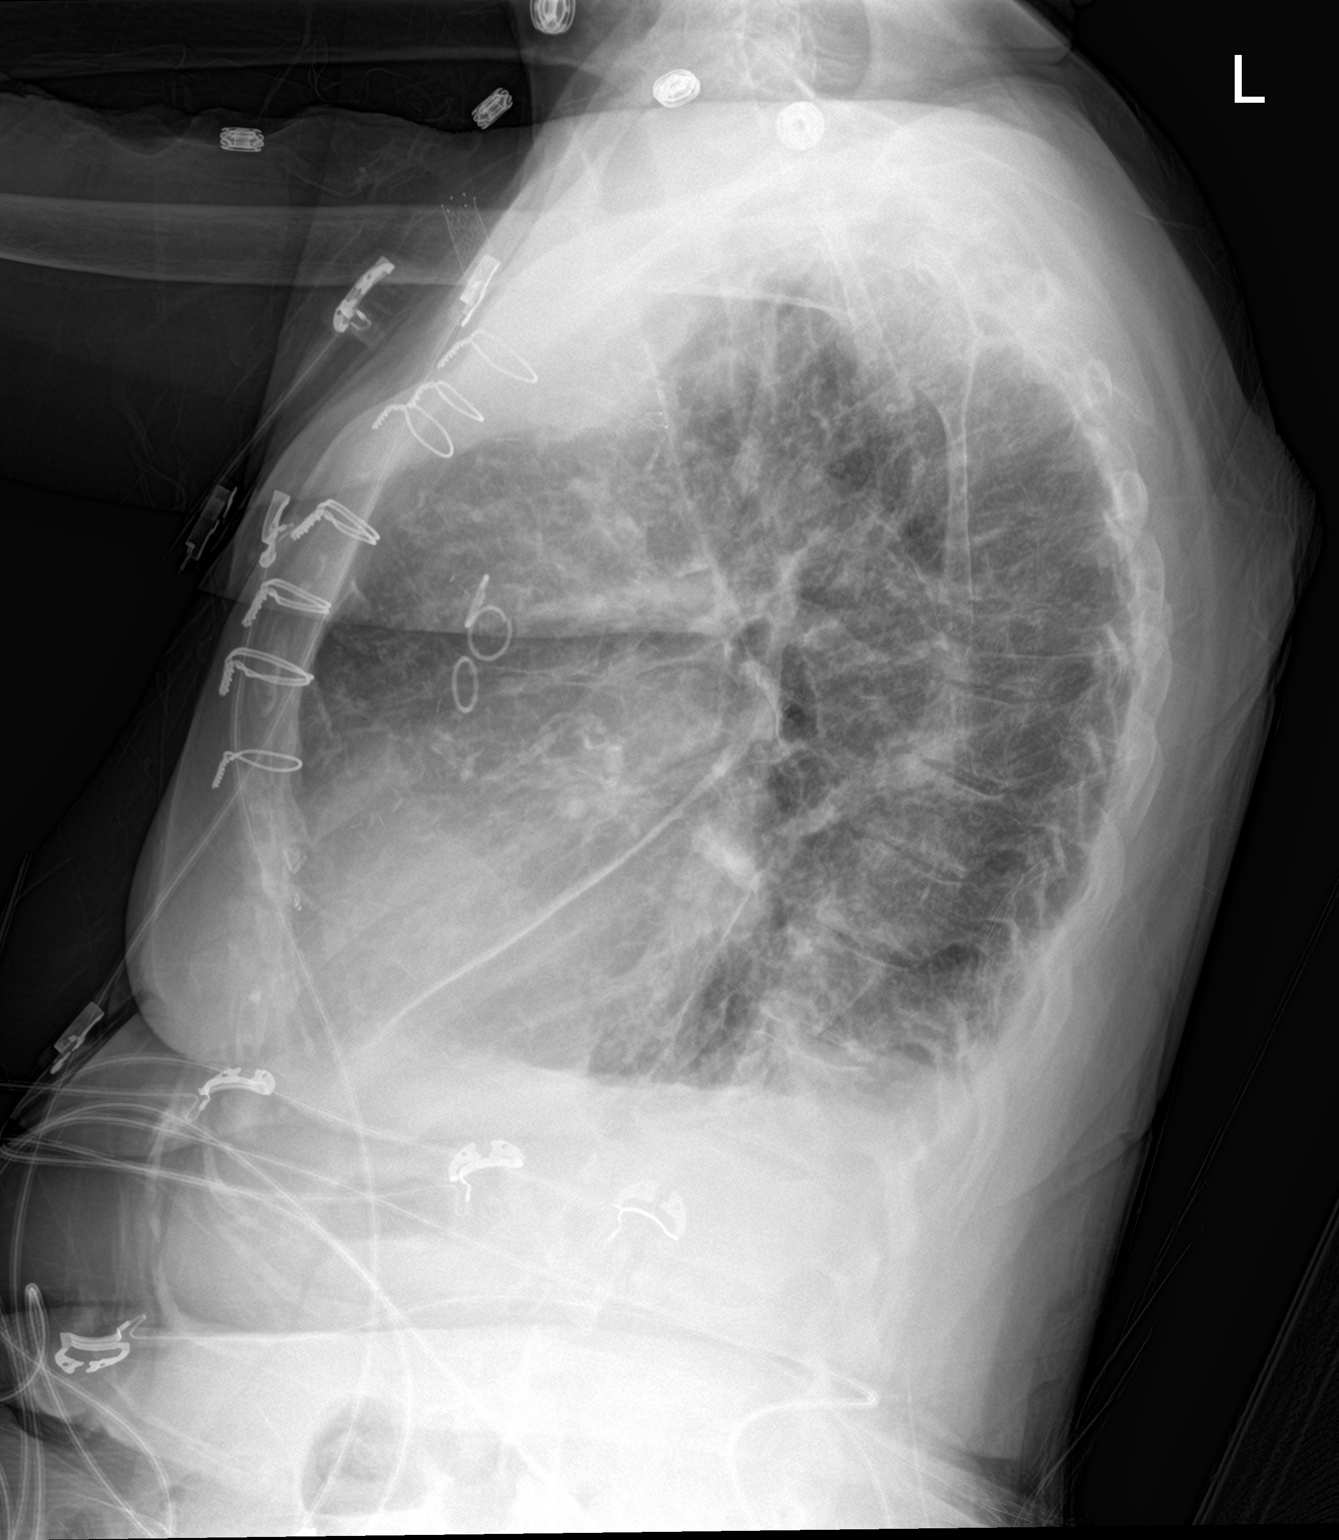

[chest ap]
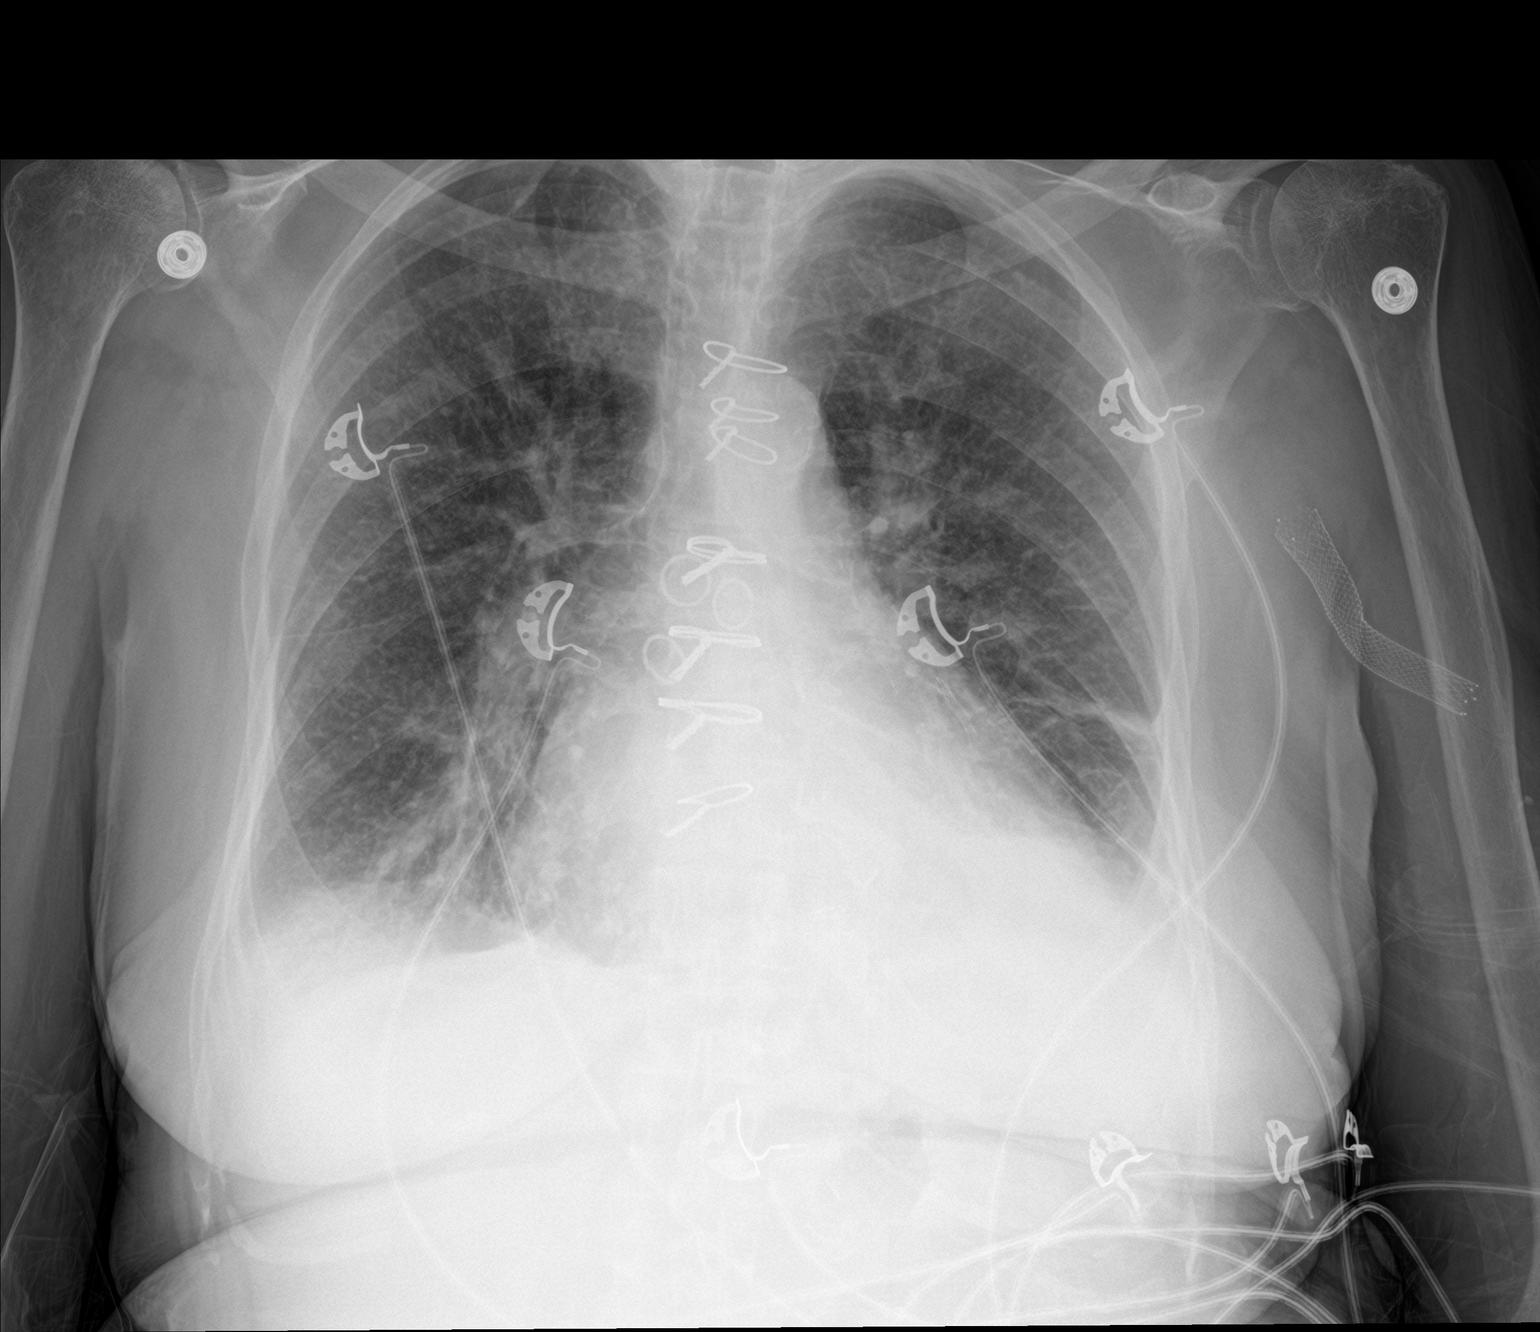

[2 of 2 positions shown; findings below may reference images not displayed]

FINDINGS: Diffuse interstitial prominence compared to prior studies, likely
with cephalized flow. Streaky density at the lung bases which has
been seen on recent priors. No convincing change from prior. Mild
cardiomegaly. Prior CABG.
IMPRESSION: 1. Vascular congestion/edema with small pleural effusions.
2. Chronic bronchitic markings at the bases.

## 2021-03-12 MED ORDER — CHLORHEXIDINE GLUCONATE CLOTH 2 % EX PADS: 6.0000 | MEDICATED_PAD | Freq: Every day | CUTANEOUS | Status: DC

## 2021-03-12 MED ORDER — LIDOCAINE-PRILOCAINE 2.5-2.5 % EX CREA: 1.0000 "application " | TOPICAL_CREAM | CUTANEOUS | Status: DC | PRN

## 2021-03-12 MED ORDER — DICLOFENAC SODIUM 1 % EX GEL: 2.0000 g | Freq: Four times a day (QID) | CUTANEOUS | 0 refills | Status: DC | PRN

## 2021-03-12 MED ORDER — HEPARIN SODIUM (PORCINE) 1000 UNIT/ML DIALYSIS: 1000.0000 [IU] | INTRAMUSCULAR | Status: DC | PRN

## 2021-03-12 MED ORDER — PENTAFLUOROPROP-TETRAFLUOROETH EX AERO: 1.0000 "application " | INHALATION_SPRAY | CUTANEOUS | Status: DC | PRN

## 2021-03-12 MED ORDER — SODIUM CHLORIDE 0.9 % IV SOLN: 100.0000 mL | INTRAVENOUS | Status: DC | PRN

## 2021-03-12 MED ORDER — ALTEPLASE 2 MG IJ SOLR
2.0000 mg | Freq: Once | INTRAMUSCULAR | Status: DC | PRN
  Filled 2021-03-12: qty 2

## 2021-03-12 MED ORDER — LIDOCAINE HCL (PF) 1 % IJ SOLN: 5.0000 mL | INTRAMUSCULAR | Status: DC | PRN

## 2021-03-12 MED ORDER — CLONIDINE HCL 0.1 MG PO TABS
0.1000 mg | ORAL_TABLET | Freq: Once | ORAL | Status: AC
  Administered 2021-03-12: 0.1 mg via ORAL
  Filled 2021-03-12: qty 1

## 2021-03-12 MED ORDER — ALPRAZOLAM 0.5 MG PO TABS
0.5000 mg | ORAL_TABLET | Freq: Once | ORAL | Status: AC
  Administered 2021-03-12: 0.5 mg via ORAL
  Filled 2021-03-12: qty 1

## 2021-03-12 NOTE — ED Triage Notes (Signed)
Pt states this afternoon she fell and ever since she has been having pain in her chest and left lower leg. Pt states she feels as if her heart is racing when she is lying down.

## 2021-03-12 NOTE — Consult Note (Signed)
[facility]  INPATIENT CONSULTATION  Reason for Consultation: ESRD, hypoxia Requesting Provider: Dr. Stark Jock  HPI: Shawna [name] is an 80 y.o. female 80 y.o. female with a past medical history of ESRD, chronic diastolic heart failure, CAD status post CABG, hypertension, GERD, diverticulosis, recurrent GI bleeds, hyperlipidemia who presents with fall, chest pain and is found to have HTN and hypoxia.    Had usual dialysis Wed.  [place] last PM, circumstances unclear but husband says happens frequently and she wasn't using walker at time; presented due to chest and LLE pain.  NTG at home provided partial relief but called EMS for ED eval. HTN > 210 here initially, given clonidine; O2 sats 89 on RA, ok on 2L. CXR with pulm edema, small effusions.  Afebrile, WBC 7.4, Hb 8.9, K 4.1.  Nephrology called for dialysis with plans for reassessment after.  Currently she's sleepy and has no additional complaints.   Had admission end of 01/2021 for GIB and + COVID. EGD with esophageal candidiasis. Remdesivir.  02/11/21 ED visit low bck pain thought to be MSK.  8/20-21 admission CP, Hb 5.8 - transfused.    PMH: Past Medical History:  Diagnosis Date   Acute on chronic respiratory failure with hypoxia ([place]) 10/10/2016   Anxiety    Arthritis    AVM (arteriovenous malformation) of colon    CAD (coronary artery disease)    a. s/p CABG in 2013 b. DES to D1 in 10/2016. c. cath in 07/2018 showing patent grafts with occlusion of D1 at prior stent site and progression of PDA disease --> medical management recommended   Carotid artery disease ([place])    a. 32-20% LICA, 08/5425    Chronic anemia    Chronic bronchitis (HCC)    Chronic diastolic CHF (congestive heart failure) ([place])    a. 02/2012 Echo EF 60-65%, nl wall motion, Gr 1 DD, mod MR   Colon cancer ([place]) 1992   Esophageal stricture    ESRD on hemodialysis ([place])    ESRD due to HTN, started dialysis 2011 and gets HD at [facility] with Dr Hinda Lenis on MWF  schedule.  Access is LUA AVF as of Sept 2014.    GERD (gastroesophageal reflux disease)    High cholesterol 12/2011   History of blood transfusion 07/2011; 12/2011; 01/2012 X 2; 04/2012   History of gout    History of lower GI bleeding    Hypertension    Iron deficiency anemia    Mitral regurgitation    a. Moderate by echo, 02/2012   Myocardial infarction [facility])    NSVT (nonsustained ventricular tachycardia) ([place])    Ovarian cancer ([place]) 1992   PAF (paroxysmal atrial fibrillation) ([place])    Pneumonia ~ 2009   PUD (peptic ulcer disease)    TIA (transient ischemic attack)    PSH: Past Surgical History:  Procedure Laterality Date   A/V SHUNTOGRAM Left 03/19/2019   Procedure: A/V SHUNTOGRAM;  Surgeon: Katha Cabal, MD;  Location: [place] [facility];  Service: Cardiovascular;  Laterality: Left;   ABDOMINAL HYSTERECTOMY  1992   APPENDECTOMY  06/1990   AV FISTULA PLACEMENT  07/2009   left upper arm   AV FISTULA PLACEMENT Right 09/06/2016   Procedure: RIGHT FOREARM ARTERIOVENOUS (AV) GRAFT;  Surgeon: Elam Dutch, MD;  Location: [place];  Service: Vascular;  Laterality: Right;   AV FISTULA PLACEMENT N/A 02/24/2017   Procedure: INSERTION OF ARTERIOVENOUS (AV) GORE-TEX GRAFT ARM (BRACHIAL AXILLARY);  Surgeon: Katha Cabal, MD;  Location: [facility];  Service: Vascular;  Laterality: N/A;   [place] Right 09/06/2016   Procedure: REMOVAL OF Right Arm ARTERIOVENOUS GORETEX GRAFT and Vein Patch angioplasty of brachial artery;  Surgeon: Angelia Mould, MD;  Location: [place];  Service: Vascular;  Laterality: Right;   BIOPSY  09/26/2019   Procedure: BIOPSY;  Surgeon: Rogene Houston, MD;  [facility];  Service: Endoscopy;;   COLON RESECTION  1992   COLON SURGERY     COLONOSCOPY N/A 03/09/2019   Procedure: COLONOSCOPY;  Surgeon: Rogene Houston, MD;  [facility];  Service: Endoscopy;  Laterality: N/A;   CORONARY ANGIOPLASTY WITH STENT PLACEMENT  12/15/11   "2"    CORONARY ANGIOPLASTY WITH STENT PLACEMENT  y/2013   "1; makes total of 3" (05/02/2012)   CORONARY ARTERY BYPASS GRAFT  06/13/2012   Procedure: CORONARY ARTERY BYPASS GRAFTING (CABG);  Surgeon: Grace Isaac, MD;  Location: [place];  Service: Open Heart Surgery;  Laterality: N/A;  cabg x four;  using left internal mammary artery, and left leg greater saphenous vein harvested endoscopically   CORONARY STENT INTERVENTION N/A 10/13/2016   Procedure: Coronary Stent Intervention;  Surgeon: Troy Sine, MD;  Location: [place] [facility];  Service: Cardiovascular;  Laterality: N/A;   DIALYSIS/PERMA CATHETER REMOVAL N/A 04/18/2017   Procedure: DIALYSIS/PERMA CATHETER REMOVAL;  Surgeon: Katha Cabal, MD;  Location: [place] [facility];  Service: Cardiovascular;  Laterality: N/A;   DILATION AND CURETTAGE OF UTERUS     ESOPHAGOGASTRODUODENOSCOPY  01/20/2012   Procedure: ESOPHAGOGASTRODUODENOSCOPY (EGD);  Surgeon: Ladene Artist, MD,FACG;  Location: [facility] END[facility]COPY;  Service: Endoscopy;  Laterality: N/A;   ESOPHAGOGASTRODUODENOSCOPY N/A 03/26/2013   Procedure: ESOPHAGOGASTRODUODENOSCOPY (EGD);  Surgeon: Irene Shipper, MD;  Location: [facility] ENDOSCOPY;  Service: Endoscopy;  Laterality: N/A;   ESOPHAGOGASTRODUODENOSCOPY N/A 04/30/2015   Procedure: ESOPHAGOGASTRODUODENOSCOPY (EGD);  Surgeon: Rogene Houston, MD;  [facility];  Service: Endoscopy;  Laterality: N/A;  1pm - moved to 10/20 @ 1:10   ESOPHAGOGASTRODUODENOSCOPY N/A 07/29/2016   Procedure: ESOPHAGOGASTRODUODENOSCOPY (EGD);  Surgeon: Manus Gunning, MD;  Location: [place];  Service: Gastroenterology;  Laterality: N/A;  enteroscopy   ESOPHAGOGASTRODUODENOSCOPY N/A 09/26/2019   Procedure: ESOPHAGOGASTRODUODENOSCOPY (EGD);  Surgeon: Rogene Houston, MD;  [facility];  Service: Endoscopy;  Laterality: N/A;  1250   ESOPHAGOGASTRODUODENOSCOPY (EGD) WITH PROPOFOL N/A 02/05/2021   Procedure: ESOPHAGOGASTRODUODENOSCOPY (EGD) WITH  PROPOFOL;  Surgeon: Eloise Harman, DO;  [facility];  Service: Endoscopy;  Laterality: N/A;   GIVENS CAPSULE STUDY N/A 03/07/2019   Procedure: GIVENS CAPSULE STUDY;  Surgeon: Rogene Houston, MD;  [facility];  Service: Endoscopy;  Laterality: N/A;  7:30   INSERTION OF DIALYSIS CATHETER N/A 10/05/2020   Procedure: ABORTED TUNNELED DIALYSIS CATHETER PLACEMENT RIGHT INTERNAL JUGULAR VEIN ;  Surgeon: Virl Cagey, MD;  Location: [facility];  Service: General;  Laterality: N/A;   INTRAOPERATIVE TRANSESOPHAGEAL ECHOCARDIOGRAM  06/13/2012   Procedure: INTRAOPERATIVE TRANSESOPHAGEAL ECHOCARDIOGRAM;  Surgeon: Grace Isaac, MD;  Location: [place];  Service: Open Heart Surgery;  Laterality: N/A;   IR DIALY SHUNT INTRO NEEDLE/INTRACATH INITIAL W/IMG LEFT Left 10/06/2020   IR FLUORO GUIDE CV LINE RIGHT  06/17/2020   IR GENERIC HISTORICAL  07/26/2016   IR FLUORO GUIDE CV LINE RIGHT 07/26/2016 Greggory Keen, MD [facility]   IR GENERIC HISTORICAL  07/26/2016   IR US GUIDE VASC ACCESS RIGHT 07/26/2016 Greggory Keen,  MD [facility]   IR GENERIC HISTORICAL  08/02/2016   IR US GUIDE VASC ACCESS RIGHT 08/02/2016 Greggory Keen, MD [facility]   IR GENERIC HISTORICAL  08/02/2016   IR FLUORO GUIDE CV LINE RIGHT 08/02/2016 Greggory Keen, MD [facility]   IR RADIOLOGY PERIPHERAL GUIDED IV START  03/28/2017   IR REMOVAL TUN CV CATH W/O FL  08/11/2020   IR THROMBECTOMY AV FISTULA W/THROMBOLYSIS INC/SHUNT/IMG LEFT Left 06/17/2020   IR US GUIDE VASC ACCESS LEFT  06/17/2020   IR US GUIDE VASC ACCESS RIGHT  03/28/2017   IR US GUIDE VASC ACCESS RIGHT  06/17/2020   LEFT HEART CATH AND CORONARY ANGIOGRAPHY N/A 09/20/2016   Procedure: Left Heart Cath and Coronary Angiography;  Surgeon: [name], MD;  Location: [place] [facility];  Service: Cardiovascular;  Laterality: N/A;   LEFT HEART CATH AND CORS/GRAFTS ANGIOGRAPHY N/A 10/13/2016   Procedure: Left Heart Cath and Cors/Grafts Angiography;  Surgeon:  Troy Sine, MD;  Location: [place] [facility];  Service: Cardiovascular;  Laterality: N/A;   LEFT HEART CATH AND CORS/GRAFTS ANGIOGRAPHY N/A 07/13/2018   Procedure: LEFT HEART CATH AND CORS/GRAFTS ANGIOGRAPHY;  Surgeon: Martinique, Peter M, MD;  Location: [place] [facility];  Service: Cardiovascular;  Laterality: N/A;   LEFT HEART CATHETERIZATION WITH CORONARY ANGIOGRAM N/A 12/15/2011   Procedure: LEFT HEART CATHETERIZATION WITH CORONARY ANGIOGRAM;  Surgeon: Burnell Blanks, MD;  Location: [facility] CATH LAB;  Service: Cardiovascular;  Laterality: N/A;   LEFT HEART CATHETERIZATION WITH CORONARY ANGIOGRAM N/A 01/10/2012   Procedure: LEFT HEART CATHETERIZATION WITH CORONARY ANGIOGRAM;  Surgeon: Peter M Martinique, MD;  Location: [facility] CATH LAB;  Service: Cardiovascular;  Laterality: N/A;   LEFT HEART CATHETERIZATION WITH CORONARY ANGIOGRAM N/A 06/08/2012   Procedure: LEFT HEART CATHETERIZATION WITH CORONARY ANGIOGRAM;  Surgeon: Burnell Blanks, MD;  Location: [facility] CATH LAB;  Service: Cardiovascular;  Laterality: N/A;   LEFT HEART CATHETERIZATION WITH CORONARY/GRAFT ANGIOGRAM N/A 12/10/2013   Procedure: LEFT HEART CATHETERIZATION WITH Beatrix Fetters;  Surgeon: [name], MD;  Location: [facility] CATH LAB;  Service: Cardiovascular;  Laterality: N/A;   OVARY SURGERY     ovarian cancer   POLYPECTOMY  03/09/2019   Procedure: POLYPECTOMY;  Surgeon: Rogene Houston, MD;  [facility];  Service: Endoscopy;;  cecal    POLYPECTOMY N/A 09/26/2019   Procedure: DUODENAL POLYPECTOMY;  Surgeon: Rogene Houston, MD;  [facility];  Service: Endoscopy;  Laterality: N/A;   REVISION OF ARTERIOVENOUS GORETEX GRAFT N/A 02/24/2017   Procedure: REVISION OF ARTERIOVENOUS GORETEX GRAFT (RESECTION);  Surgeon: Katha Cabal, MD;  Location: [facility];  Service: Vascular;  Laterality: N/A;   REVISON OF ARTERIOVENOUS FISTULA Left 06/19/2020   Procedure: REVISION OF LEFT UPPER ARM AV GRAFT WITH  INTERPOSITION JUMP GRAFT USING 6MM GORE LIMB;  Surgeon: Marty Heck, MD;  Location: [place];  Service: Vascular;  Laterality: Left;   SHUNTOGRAM N/A 10/15/2013   Procedure: Fistulogram;  Surgeon: Serafina Mitchell, MD;  Location: [facility] CATH LAB;  Service: Cardiovascular;  Laterality: N/A;   THROMBECTOMY / ARTERIOVENOUS GRAFT REVISION  2011   left upper arm   TUBAL LIGATION  1980's   UPPER EXTREMITY ANGIOGRAPHY Bilateral 12/06/2016   Procedure: Upper Extremity Angiography;  Surgeon: Katha Cabal, MD;  Location: [place] [facility];  Service: Cardiovascular;  Laterality: Bilateral;   UPPER EXTREMITY INTERVENTION Left 06/06/2017   Procedure: UPPER EXTREMITY INTERVENTION;  Surgeon: Katha Cabal, MD;  Location: [place] [facility];  Service: Cardiovascular;  Laterality: Left;    Past Medical History:  Diagnosis Date   Acute on chronic respiratory failure with hypoxia ([place]) 10/10/2016   Anxiety    Arthritis    AVM (arteriovenous malformation) of colon    CAD (coronary artery disease)    a. s/p CABG in 2013 b. DES to D1 in 10/2016. c. cath in 07/2018 showing patent grafts with occlusion of D1 at prior stent site and progression of PDA disease --> medical management recommended   Carotid artery disease ([place])    a. 87-56% LICA, 10/3327    Chronic anemia    Chronic bronchitis (HCC)    Chronic diastolic CHF (congestive heart failure) ([place])    a. 02/2012 Echo EF 60-65%, nl wall motion, Gr 1 DD, mod MR   Colon cancer ([place]) 1992   Esophageal stricture    ESRD on hemodialysis ([place])    ESRD due to HTN, started dialysis 2011 and gets HD at [facility] with Dr Hinda Lenis on MWF schedule.  Access is LUA AVF as of Sept 2014.    GERD (gastroesophageal reflux disease)    High cholesterol 12/2011   History of blood transfusion 07/2011; 12/2011; 01/2012 X 2; 04/2012   History of gout    History of lower GI bleeding    Hypertension    Iron deficiency anemia    Mitral regurgitation    a. Moderate by  echo, 02/2012   Myocardial infarction [facility])    NSVT (nonsustained ventricular tachycardia) ([place])    Ovarian cancer ([place]) 1992   PAF (paroxysmal atrial fibrillation) ([place])    Pneumonia ~ 2009   PUD (peptic ulcer disease)    TIA (transient ischemic attack)     Medications:  I have reviewed the patient's current medications.   (Not in a hospital admission)   ALLERGIES:   Allergies  Allergen Reactions   Aspirin Other (See Comments)    High Doses Mess up her stomach; "makes my bowels have blood in them". Takes 81 mg EC Aspirin    Penicillins Other (See Comments)    SYNCOPE? , "makes me real weak when I take it; like I'll pass out"  Has patient had a PCN reaction causing immediate rash, facial/tongue/throat swelling, SOB or lightheadedness with hypotension: Yes Has patient had a PCN reaction causing severe rash involving mucus membranes or skin necrosis: no Has patient had a PCN reaction that required hospitalization no Has patient had a PCN reaction occurring within the last 10 years: no If all of the above   Amlodipine Swelling   Bactrim [Sulfamethoxazole-Trimethoprim] Rash   Contrast Media [Iodinated Diagnostic Agents] Itching   Iron Itching and Other (See Comments)    "they gave me iron in dialysis; had to give me Benadryl cause I had to have the iron" (05/02/2012)   Nitrofurantoin Hives   Tylenol [Acetaminophen] Itching and Other (See Comments)    Makes her feet on fire per pt   Gabapentin Other (See Comments)    Unknown reaction   Hydralazine Itching    Has tolerated while inpatient   Levofloxacin    No Known Allergies    Ranexa [Ranolazine] Other (See Comments)    Myoclonus-hospitalized    Dexilant [Dexlansoprazole] Other (See Comments)    Upset stomach   Levaquin [Levofloxacin In D5w] Rash   Morphine And Related Itching and Other (See Comments)    Itching in feet   Plavix [Clopidogrel Bisulfate] Rash   Protonix [Pantoprazole Sodium] Rash  Venofer [Ferric  Oxide] Itching and Other (See Comments)    Patient reports using Benadryl prior to doses as [facility]    FAM HX: Family History  Problem Relation Age of Onset   Heart disease Mother        Heart Disease before age 23   Hyperlipidemia Mother    Hypertension Mother    Diabetes Mother    Heart attack Mother    Heart disease Father        Heart Disease before age 55   Hyperlipidemia Father    Hypertension Father    Diabetes Father    Diabetes Sister    Hypertension Sister    Diabetes Brother    Hyperlipidemia Brother    Heart attack Brother    Hypertension Sister    Heart attack Brother    Colon cancer Child 3   Other Other        noncontributory for early CAD   Esophageal cancer Neg Hx    Liver disease Neg Hx    Kidney disease Neg Hx    Colon polyps Neg Hx     Social History:   reports that she has never smoked. She has never used smokeless tobacco. She reports that she does not drink alcohol and does not use drugs.  ROS: 12 system ROS  Blood pressure (!) 110/41, pulse 69, temperature 98.3 F (36.8 C), temperature source Oral, resp. rate (!) 23, height 5\' 1"  (1.549 m), weight 47.6 kg, SpO2 100 %. PHYSICAL EXAM: Gen: elderly woman comfortable on stretcher  Eyes: muddy sclera, EOMI ENT: MMM Neck: supple CV:  RRR, no rub Abd:  soft, thin Back: rales to 1/3 up fields Extr: 1+ ankle edema, LUE AVG +t/b Neuro: grossly nonfocal but sleepy    Results for orders placed or performed during the hospital encounter of 03/12/21 (from the past 48 hour(s))  Basic metabolic panel     Status: Abnormal   Collection Time: 03/12/21  4:13 AM  Result Value Ref Range   Sodium 141 135 - 145 mmol/L   Potassium 4.1 3.5 - 5.1 mmol/L   Chloride 100 98 - 111 mmol/L   CO2 29 22 - 32 mmol/L   Glucose, Bld 111 (H) 70 - 99 mg/dL    Comment: Glucose reference range applies only to samples taken after fasting for at least 8 hours.   BUN 26 (H) 8 - 23 mg/dL   Creatinine, Ser 5.97 (H)  0.44 - 1.00 mg/dL   Calcium 9.3 8.9 - 10.3 mg/dL   GFR, Estimated 7 (L) >60 mL/min    Comment: (NOTE) Calculated using the CKD-EPI Creatinine Equation (2021)    Anion gap 12 5 - 15    Comment: Performed at [facility], [address] [place], [place] [address]  CBC     Status: Abnormal   Collection Time: 03/12/21  4:13 AM  Result Value Ref Range   WBC 7.4 4.0 - 10.5 K/uL   RBC 2.56 (L) 3.87 - 5.11 MIL/uL   Hemoglobin 8.9 (L) 12.0 - 15.0 g/dL   HCT 28.5 (L) 36.0 - 46.0 %   MCV 111.3 (H) 80.0 - 100.0 fL   MCH 34.8 (H) 26.0 - 34.0 pg   MCHC 31.2 30.0 - 36.0 g/dL   RDW 21.8 (H) 11.5 - 15.5 %   Platelets 313 150 - 400 K/uL   nRBC 0.0 0.0 - 0.2 %    Comment: Performed at [facility], [address] [place], [place]  [address]  Troponin I (High Sensitivity)     Status: Abnormal   Collection Time: 03/12/21  4:13 AM  Result Value Ref Range   Troponin I (High Sensitivity) 43 (H) <18 ng/L    Comment: (NOTE) Elevated high sensitivity troponin I (hsTnI) values and significant  changes across serial measurements may suggest ACS but many other  chronic and acute conditions are known to elevate hsTnI results.  Refer to the "Links" section for chest pain algorithms and additional  guidance. Performed at [facility], [address], [place], [place] [address]   Troponin I (High Sensitivity)     Status: Abnormal   Collection Time: 03/12/21  6:00 AM  Result Value Ref Range   Troponin I (High Sensitivity) 56 (H) <18 ng/L    Comment: (NOTE) Elevated high sensitivity troponin I (hsTnI) values and significant  changes across serial measurements may suggest ACS but many other  chronic and acute conditions are known to elevate hsTnI results.  Refer to the "Links" section for chest pain algorithms and additional  guidance. Performed at [facility], [address], [place], [place] [address]    *Note: Due to a large number of results and/or encounters for the requested time period, some results  have not been displayed. A complete set of results can be found in Results Review.    DG Chest 2 View  Result Date: 03/12/2021 CLINICAL DATA:  Fall with pain in the chest EXAM: CHEST - 2 VIEW COMPARISON:  02/27/2021 FINDINGS: Diffuse interstitial prominence compared to prior studies, likely with cephalized flow. Streaky density at the lung bases which has been seen on recent priors. No convincing change from prior. Mild cardiomegaly. Prior CABG. IMPRESSION: 1. Vascular congestion/edema with small pleural effusions. 2. Chronic bronchitic markings at the bases. Electronically Signed   By: Monte Fantasia M.D.   On: 03/12/2021 04:37   DG Knee Complete 4 Views Left  Result Date: 03/12/2021 CLINICAL DATA:  Fall.  Knee pain. EXAM: LEFT KNEE - COMPLETE 4+ VIEW COMPARISON:  None. FINDINGS: No evidence for an acute fracture. No subluxation or dislocation. No joint effusion. Bones are demineralized. Surgical clips are noted in the medial soft tissues adjacent to the proximal tibia. Meniscal calcification evident. IMPRESSION: 1. No acute bony abnormality. 2. Meniscal calcification. Chondrocalcinosis can be associated with CPPD. Electronically Signed   By: Misty Stanley M.D.   On: 03/12/2021 05:06    Assessment/Recommendations:    # ESRD:  -outpatient orders: J[facility] Docker  on MWF . EDW 55kg HD [place] 2K/2.5Ca  Time 3.5 hrs Heparin none. Access LUE AVG Nipro Cellentia 17h (due to allergy) Blood flow 300 DFR 500   15g -HD today -RN reports she's tolerated the diaylzer here previously; will double rinse   # AHRF, pulmonary edema -UF as tolerated today and reassess in the ED for possible D/C after HD   # Volume/ hypertension: EDW 55.0kg. Attempt to achieve EDW as tolerated   # Anemia of Chronic Kidney Disease, GIB: Hemoglobin stable at 8.9. Currently receiving epogen 14000 units qtreatment.  -h/o diverticulosis, GI has recently seen with ABLA -no heparin w/ HD   # Secondary  Hyperparathyroidism/Hyperphosphatemia: resume home binders, 0.16mcg calcitriol TIW q HD  #Falls:  sound mechanical, wasn't using walker when occurred.  Per ED.     # Additional recommendations: - Dose all meds for creatinine clearance < 10 ml/min  - Unless absolutely necessary, no MRIs with gadolinium.  - Implement save arm precautions.  Prefer needle sticks in the  dorsum of the hands or wrists.  No blood pressure measurements in arm. - If blood transfusion is requested during hemodialysis sessions, please alert [place] prior to the session.  - If a hemodialysis catheter line culture is requested, please alert [place] as only hemodialysis nurses are able to collect those specimens.   Justin Mend 03/12/2021, 8:46 AM

## 2021-03-12 NOTE — ED Provider Notes (Signed)
[facility] EMERGENCY DEPARTMENT Provider Note   CSN: [id number] Arrival date & time: 03/12/21  0316     History Chief Complaint  Patient presents with   Chest Pain   Leg Pain    Shawna [name] is a 80 y.o. female.  Patient is an 80 year old female with past medical history of coronary artery disease with history of stents and CABG, CHF, end-stage renal disease on hemodialysis.  Patient presenting today for evaluation of fall.  Patient reports twisting awkwardly and falling on her left leg.  She describes pain just below her knee and in her chest.  She took several nitroglycerin at home with some relief.  She denies any difficulty breathing.  The history is provided by the patient.  Chest Pain Pain location:  Substernal area Pain quality: sharp   Pain radiates to:  Does not radiate Pain severity:  Moderate Onset quality:  Sudden Leg Pain     Past Medical History:  Diagnosis Date   Acute on chronic respiratory failure with hypoxia (HCC) 10/10/2016   Anxiety    Arthritis    AVM (arteriovenous malformation) of colon    CAD (coronary artery disease)    a. s/p CABG in 2013 b. DES to D1 in 10/2016. c. cath in 07/2018 showing patent grafts with occlusion of D1 at prior stent site and progression of PDA disease --> medical management recommended   Carotid artery disease ([place])    a. 19-37% LICA, 03/239    Chronic anemia    Chronic bronchitis (HCC)    Chronic diastolic CHF (congestive heart failure) ([place])    a. 02/2012 Echo EF 60-65%, nl wall motion, Gr 1 DD, mod MR   Colon cancer ([place]) 1992   Esophageal stricture    ESRD on hemodialysis ([place])    ESRD due to HTN, started dialysis 2011 and gets HD at [facility] with Dr Hinda Lenis on MWF schedule.  Access is LUA AVF as of Sept 2014.    GERD (gastroesophageal reflux disease)    High cholesterol 12/2011   History of blood transfusion 07/2011; 12/2011; 01/2012 X 2; 04/2012   History of gout    History of lower GI bleeding    Hypertension     Iron deficiency anemia    Mitral regurgitation    a. Moderate by echo, 02/2012   Myocardial infarction [facility])    NSVT (nonsustained ventricular tachycardia) ([place])    Ovarian cancer ([place]) 1992   PAF (paroxysmal atrial fibrillation) ([place])    Pneumonia ~ 2009   PUD (peptic ulcer disease)    TIA (transient ischemic attack)     Patient Active Problem List   Diagnosis Date Noted   Symptomatic anemia 02/27/2021   Moderate protein-calorie malnutrition ([place]) 02/27/2021   Hypokalemia 02/27/2021   COVID-19 virus infection 02/03/2021   Leukocytosis 02/03/2021   Elevated MCV 02/03/2021   Hypoalbuminemia due to protein-calorie malnutrition ([place]) 02/03/2021   Hypothyroidism (acquired) 02/03/2021   Myositis 12/03/2020   Ataxia 12/02/2020   Diabetes mellitus type 2 in nonobese ([place]) 11/10/2020   Acute pulmonary edema ([place]) 11/10/2020   Generalized weakness 11/09/2020   Dialysis AV fistula malfunction, initial encounter ([place])    Jugular vein occlusion, right (HCC)    Failure of surgically constructed arteriovenous fistula ([place]) 10/03/2020   Myoclonus 08/31/2020   Clotted renal dialysis AV graft, initial encounter ([place])    Hypertensive heart and chronic kidney disease with heart failure and stage 1 through stage 4 chronic kidney disease, or chronic kidney disease (  [place])    Hemodialysis-associated hypotension    Acute hypoxemic respiratory failure ([place]) 06/14/2020   Hypertension 06/12/2020   Irritable bowel syndrome 02/25/2020   Adenomatous duodenal polyp 09/10/2019   History of GI bleed 09/10/2019   Angina pectoris ([place]) 06/05/2019   Chest pain 06/03/2019   Small intestinal bacterial overgrowth 05/14/2019   Iron deficiency anemia 04/02/2019   GI bleed 03/08/2019   Gastrointestinal hemorrhage with melena 03/06/2019   Acute respiratory failure with hypoxia (HCC) 12/25/2018   Elevated troponin 12/14/2018   Chest pain at rest 07/13/2018   Hand steal syndrome ([place]) 08/01/2017   Anemia  07/14/2017   Coronary artery disease 06/05/2017   Mesenteric ischemia (HCC)    Diverticulitis    Enteritis    Complication of vascular access for dialysis 03/19/2017   Preoperative clearance 01/25/2017   H/O non-ST elevation myocardial infarction (NSTEMI) 10/24/2016   Fluid overload 71/24/5809   Complication from renal dialysis device 10/10/2016   Non-ST elevation MI (NSTEMI) ([place])    Encounter for fitting and adjustment of vascular catheter    Heme positive stool    Demand ischemia ([place]) 07/27/2016   Hypertensive emergency 07/08/2016   Acute on chronic respiratory failure with hypoxia [facility])    Cardiac arrest [facility])    Palliative care encounter    Goals of care, counseling/discussion    Hypertensive crisis without congestive heart failure 05/09/2016   Flash pulmonary edema ([place]) 04/06/2016   Acute respiratory failure ([place]) 04/06/2016   Hypertensive crisis 01/27/2016   History of colon cancer 01/27/2016   History of ovarian cancer 01/27/2016   Hypertensive urgency 01/27/2016   Paroxysmal atrial fibrillation ([place]) 10/14/2015   Coronary angioplasty status 10/14/2015   Malignant neoplasm of right ovary ([place]) 10/14/2015   Narrow complex tachycardia ([place]) 09/08/2015   SVT (supraventricular tachycardia) ([place]) 09/08/2015   Influenza A 08/30/2015   Acute on chronic diastolic CHF (congestive heart failure) ([place]) 05/04/2015   Unstable angina ([place]) 05/03/2015   DOE (dyspnea on exertion)    Essential hypertension    Hyponatremia 10/01/2014   Pain in joint, lower leg 08/14/2014   Dacryocystitis 05/29/2013   Chronic diastolic CHF (congestive heart failure) ([place]) 03/22/2013   GI bleeding 03/21/2013   Acute post-hemorrhagic anemia 03/21/2013   Occlusion and stenosis of carotid artery without mention of cerebral infarction 01/24/2013   Hx of CABG 07/05/2012   Carotid artery disease ([place]) 07/05/2012   Mitral regurgitation 06/12/2012   Pneumonia 06/09/2012   Non-STEMI (non-ST elevated  myocardial infarction) ([place]) 06/08/2012   Ischemic chest pain ([place]) 03/01/2012   AVM (arteriovenous malformation) of small bowel, acquired 01/20/2012   GERD (gastroesophageal reflux disease) 01/09/2012   HLD (hyperlipidemia) 01/05/2012   Atherosclerotic heart disease of native coronary artery without angina pectoris 12/16/2011   Essential hypertension, benign 12/16/2011   ESRD on hemodialysis ([place]) 12/16/2011   Anxiety disorder 05/04/2011   Anemia in chronic kidney disease 04/29/2011   ESRD (end stage renal disease) on dialysis ([place]) 04/29/2011   Gout 04/29/2011   Hypertensive chronic kidney disease with stage 5 chronic kidney disease or end stage renal disease ([place]) 04/29/2011    Past Surgical History:  Procedure Laterality Date   A/V SHUNTOGRAM Left 03/19/2019   Procedure: A/V SHUNTOGRAM;  Surgeon: Katha Cabal, MD;  Location: [place] CV LAB;  Service: Cardiovascular;  Laterality: Left;   ABDOMINAL HYSTERECTOMY  1992   APPENDECTOMY  06/1990   AV FISTULA PLACEMENT  07/2009   left upper arm   AV FISTULA PLACEMENT Right  09/06/2016   Procedure: RIGHT FOREARM ARTERIOVENOUS (AV) GRAFT;  Surgeon: Elam Dutch, MD;  Location[facility] OR;  Service: Vascular;  Laterality: Right;   AV FISTULA PLACEMENT N/A 02/24/2017   Procedure: INSERTION OF ARTERIOVENOUS (AV) GORE-TEX GRAFT ARM (BRACHIAL AXILLARY);  Surgeon: Katha Cabal, MD;  Location: [facility] ORS;  Service: Vascular;  Laterality: N/A;   [place] Right 09/06/2016   Procedure: REMOVAL OF Right Arm ARTERIOVENOUS GORETEX GRAFT and Vein Patch angioplasty of brachial artery;  Surgeon: Angelia Mould, MD;  Location: [place];  Service: Vascular;  Laterality: Right;   BIOPSY  09/26/2019   Procedure: BIOPSY;  Surgeon: Rogene Houston, MD;  [facility];  Service: Endoscopy;;   COLON RESECTION  1992   COLON SURGERY     COLONOSCOPY N/A 03/09/2019   Procedure: COLONOSCOPY;  Surgeon: Rogene Houston, MD;  [facility];  Service: Endoscopy;  Laterality: N/A;   CORONARY ANGIOPLASTY WITH STENT PLACEMENT  12/15/11   "2"   CORONARY ANGIOPLASTY WITH STENT PLACEMENT  y/2013   "1; makes total of 3" (05/02/2012)   CORONARY ARTERY BYPASS GRAFT  06/13/2012   Procedure: CORONARY ARTERY BYPASS GRAFTING (CABG);  Surgeon: Grace Isaac, MD;  Location: [place];  Service: Open Heart Surgery;  Laterality: N/A;  cabg x four;  using left internal mammary artery, and left leg greater saphenous vein harvested endoscopically   CORONARY STENT INTERVENTION N/A 10/13/2016   Procedure: Coronary Stent Intervention;  Surgeon: Troy Sine, MD;  Location: [place] CV LAB;  Service: Cardiovascular;  Laterality: N/A;   DIALYSIS/PERMA CATHETER REMOVAL N/A 04/18/2017   Procedure: DIALYSIS/PERMA CATHETER REMOVAL;  Surgeon: Katha Cabal, MD;  Location: [place] CV LAB;  Service: Cardiovascular;  Laterality: N/A;   DILATION AND CURETTAGE OF UTERUS     ESOPHAGOGASTRODUODENOSCOPY  01/20/2012   Procedure: ESOPHAGOGASTRODUODENOSCOPY (EGD);  Surgeon: Ladene Artist, MD,FACG;  Location: [facility] ENDOSCOPY;  Service: Endoscopy;  Laterality: N/A;   ESOPHAGOGASTRODUODENOSCOPY N/A 03/26/2013   Procedure: ESOPHAGOGASTRODUODENOSCOPY (EGD);  Surgeon: Irene Shipper, MD;  Location: [facility] ENDOSCOPY;  Service: Endoscopy;  Laterality: N/A;   ESOPHAGOGASTRODUODENOSCOPY N/A 04/30/2015   Procedure: ESOPHAGOGASTRODUODENOSCOPY (EGD);  Surgeon: Rogene Houston, MD;  [facility];  Service: Endoscopy;  Laterality: N/A;  1pm - moved to 10/20 @ 1:10   ESOPHAGOGASTRODUODENOSCOPY N/A 07/29/2016   Procedure: ESOPHAGOGASTRODUODENOSCOPY (EGD);  Surgeon: Manus Gunning, MD;  Location: [place];  Service: Gastroenterology;  Laterality: N/A;  enteroscopy   ESOPHAGOGASTRODUODENOSCOPY N/A 09/26/2019   Procedure: ESOPHAGOGASTRODUODENOSCOPY (EGD);  Surgeon: Rogene Houston, MD;  [facility];  Service: Endoscopy;  Laterality: N/A;  1250    ESOPHAGOGASTRODUODENOSCOPY (EGD) WITH PROPOFOL N/A 02/05/2021   Procedure: ESOPHAGOGASTRODUODENOSCOPY (EGD) WITH PROPOFOL;  Surgeon: Eloise Harman, DO;  [facility];  Service: Endoscopy;  Laterality: N/A;   GIVENS CAPSULE STUDY N/A 03/07/2019   Procedure: GIVENS CAPSULE STUDY;  Surgeon: Rogene Houston, MD;  [facility];  Service: Endoscopy;  Laterality: N/A;  7:30   INSERTION OF DIALYSIS CATHETER N/A 10/05/2020   Procedure: ABORTED TUNNELED DIALYSIS CATHETER PLACEMENT RIGHT INTERNAL JUGULAR VEIN ;  Surgeon: Virl Cagey, MD;  Location: [facility] ORS;  Service: General;  Laterality: N/A;   INTRAOPERATIVE TRANSESOPHAGEAL ECHOCARDIOGRAM  06/13/2012   Procedure: INTRAOPERATIVE TRANSESOPHAGEAL ECHOCARDIOGRAM;  Surgeon: Grace Isaac, MD;  Location: [place];  Service: Open Heart Surgery;  Laterality: N/A;   IR DIALY SHUNT INTRO NEEDLE/INTRACATH INITIAL W/IMG LEFT  Left 10/06/2020   IR FLUORO GUIDE CV LINE RIGHT  06/17/2020   IR GENERIC HISTORICAL  07/26/2016   IR FLUORO GUIDE CV LINE RIGHT 07/26/2016 Greggory Keen, MD [facility]   IR GENERIC HISTORICAL  07/26/2016   IR US GUIDE VASC ACCESS RIGHT 07/26/2016 Greggory Keen, MD [facility]   IR GENERIC HISTORICAL  08/02/2016   IR US GUIDE VASC ACCESS RIGHT 08/02/2016 Greggory Keen, MD [facility]   IR GENERIC HISTORICAL  08/02/2016   IR FLUORO GUIDE CV LINE RIGHT 08/02/2016 Greggory Keen, MD [facility]   IR RADIOLOGY PERIPHERAL GUIDED IV START  03/28/2017   IR REMOVAL TUN CV CATH W/O FL  08/11/2020   IR THROMBECTOMY AV FISTULA W/THROMBOLYSIS INC/SHUNT/IMG LEFT Left 06/17/2020   IR US GUIDE VASC ACCESS LEFT  06/17/2020   IR US GUIDE VASC ACCESS RIGHT  03/28/2017   IR US GUIDE VASC ACCESS RIGHT  06/17/2020   LEFT HEART CATH AND CORONARY ANGIOGRAPHY N/A 09/20/2016   Procedure: Left Heart Cath and Coronary Angiography;  Surgeon: [name], MD;  Location: [place] CV LAB;  Service: Cardiovascular;  Laterality: N/A;   LEFT HEART  CATH AND CORS/GRAFTS ANGIOGRAPHY N/A 10/13/2016   Procedure: Left Heart Cath and Cors/Grafts Angiography;  Surgeon: Troy Sine, MD;  Location: [place] CV LAB;  Service: Cardiovascular;  Laterality: N/A;   LEFT HEART CATH AND CORS/GRAFTS ANGIOGRAPHY N/A 07/13/2018   Procedure: LEFT HEART CATH AND CORS/GRAFTS ANGIOGRAPHY;  Surgeon: Martinique, Peter M, MD;  Location: [place] CV LAB;  Service: Cardiovascular;  Laterality: N/A;   LEFT HEART CATHETERIZATION WITH CORONARY ANGIOGRAM N/A 12/15/2011   Procedure: LEFT HEART CATHETERIZATION WITH CORONARY ANGIOGRAM;  Surgeon: Burnell Blanks, MD;  Location: [facility] CATH LAB;  Service: Cardiovascular;  Laterality: N/A;   LEFT HEART CATHETERIZATION WITH CORONARY ANGIOGRAM N/A 01/10/2012   Procedure: LEFT HEART CATHETERIZATION WITH CORONARY ANGIOGRAM;  Surgeon: Peter M Martinique, MD;  Location: [facility] CATH LAB;  Service: Cardiovascular;  Laterality: N/A;   LEFT HEART CATHETERIZATION WITH CORONARY ANGIOGRAM N/A 06/08/2012   Procedure: LEFT HEART CATHETERIZATION WITH CORONARY ANGIOGRAM;  Surgeon: Burnell Blanks, MD;  Location: [facility] CATH LAB;  Service: Cardiovascular;  Laterality: N/A;   LEFT HEART CATHETERIZATION WITH CORONARY/GRAFT ANGIOGRAM N/A 12/10/2013   Procedure: LEFT HEART CATHETERIZATION WITH Beatrix Fetters;  Surgeon: [name], MD;  Location: [facility] CATH LAB;  Service: Cardiovascular;  Laterality: N/A;   OVARY SURGERY     ovarian cancer   POLYPECTOMY  03/09/2019   Procedure: POLYPECTOMY;  Surgeon: Rogene Houston, MD;  [facility];  Service: Endoscopy;;  cecal    POLYPECTOMY N/A 09/26/2019   Procedure: DUODENAL POLYPECTOMY;  Surgeon: Rogene Houston, MD;  [facility];  Service: Endoscopy;  Laterality: N/A;   REVISION OF ARTERIOVENOUS GORETEX GRAFT N/A 02/24/2017   Procedure: REVISION OF ARTERIOVENOUS GORETEX GRAFT (RESECTION);  Surgeon: Katha Cabal, MD;  Location: [facility] ORS;  Service: Vascular;  Laterality:  N/A;   REVISON OF ARTERIOVENOUS FISTULA Left 06/19/2020   Procedure: REVISION OF LEFT UPPER ARM AV GRAFT WITH INTERPOSITION JUMP GRAFT USING 6MM GORE LIMB;  Surgeon: Marty Heck, MD;  Location: [place];  Service: Vascular;  Laterality: Left;   SHUNTOGRAM N/A 10/15/2013   Procedure: Fistulogram;  Surgeon: Serafina Mitchell, MD;  Location: [facility] CATH LAB;  Service: Cardiovascular;  Laterality: N/A;   THROMBECTOMY / ARTERIOVENOUS GRAFT REVISION  2011   left upper arm   TUBAL LIGATION  1980's  UPPER EXTREMITY ANGIOGRAPHY Bilateral 12/06/2016   Procedure: Upper Extremity Angiography;  Surgeon: Katha Cabal, MD;  Location: [place] CV LAB;  Service: Cardiovascular;  Laterality: Bilateral;   UPPER EXTREMITY INTERVENTION Left 06/06/2017   Procedure: UPPER EXTREMITY INTERVENTION;  Surgeon: Katha Cabal, MD;  Location: [place] CV LAB;  Service: Cardiovascular;  Laterality: Left;     OB History     Gravida  2   Para  2   Term      Preterm  2   AB      Living  2      SAB      IAB      Ectopic      Multiple      Live Births              Family History  Problem Relation Age of Onset   Heart disease Mother        Heart Disease before age 29   Hyperlipidemia Mother    Hypertension Mother    Diabetes Mother    Heart attack Mother    Heart disease Father        Heart Disease before age 32   Hyperlipidemia Father    Hypertension Father    Diabetes Father    Diabetes Sister    Hypertension Sister    Diabetes Brother    Hyperlipidemia Brother    Heart attack Brother    Hypertension Sister    Heart attack Brother    Colon cancer Child 10   Other Other        noncontributory for early CAD   Esophageal cancer Neg Hx    Liver disease Neg Hx    Kidney disease Neg Hx    Colon polyps Neg Hx     Social History   Tobacco Use   Smoking status: Never   Smokeless tobacco: Never  Vaping Use   Vaping Use: Never used  Substance Use Topics   Alcohol  use: No    Alcohol/week: 0.0 standard drinks   Drug use: No    Home Medications Prior to Admission medications   Medication Sig Start Date End Date Taking? Authorizing Provider  aspirin EC 81 MG tablet Take 81 mg by mouth daily.  09/27/19   Rehman, Mechele Dawley, MD  atorvastatin (LIPITOR) 10 MG tablet Take 10 mg by mouth daily.    [provider]  benzonatate (TESSALON PERLES) 100 MG capsule Take 1 capsule (100 mg total) by mouth every 6 (six) hours as needed for cough. 02/28/21   Patrecia Pour, MD  fluticasone (FLONASE) 50 MCG/ACT nasal spray Place 1 spray into both nostrils at bedtime as needed for allergies.     [provider]  hydrOXYzine (VISTARIL) 100 MG capsule Take 100 mg by mouth daily as needed for anxiety or itching. 11/23/20   [provider]  isosorbide mononitrate (IMDUR) 60 MG 24 hr tablet Take 1 tablet (60 mg total) by mouth in the morning and at bedtime. 10/20/20 02/27/21  Erma Heritage, PA-C  levothyroxine (SYNTHROID) 25 MCG tablet Take 1 tablet (25 mcg total) by mouth daily. Patient not taking: No sig reported 02/02/21   Brita Romp, NP  lidocaine-prilocaine (EMLA) cream Apply 1 application topically every Monday, Wednesday, and Friday. Prior to dialysis    [provider]  loperamide (IMODIUM A-D) 2 MG tablet Take 2 tablets (4 mg total) by mouth daily as needed for diarrhea or loose stools. 12/17/20  Thurnell Lose, MD  loratadine (CLARITIN) 10 MG tablet Take 1 tablet (10 mg total) by mouth daily as needed for allergies. 07/14/18   Hosie Poisson, MD  metoprolol tartrate (LOPRESSOR) 25 MG tablet Take 1 tablet (25 mg total) by mouth 2 (two) times daily. Patient not taking: No sig reported 12/17/20   Thurnell Lose, MD  midodrine (PROAMATINE) 5 MG tablet Take Monday, Wednesday, Friday morning BEFORE dialysis 12/22/20   Arnoldo Lenis, MD  multivitamin (RENA-VIT) TABS tablet Take 1 tablet by mouth daily.    [provider]   nitroGLYCERIN (NITROSTAT) 0.4 MG SL tablet Place 1 tablet (0.4 mg total) under the tongue every 5 (five) minutes x 3 doses as needed for chest pain (if no relief after 2nd dose, proceed to the ED for an evaluation or call 911). 02/19/21   Verta Ellen., NP  omeprazole (PRILOSEC) 20 MG capsule Take 1 capsule (20 mg total) by mouth daily. 12/01/20   Rogene Houston, MD  sevelamer carbonate (RENVELA) 800 MG tablet Take 2 tablets (1,600 mg total) by mouth 3 (three) times daily with meals. 06/20/20   Meccariello, Bernita Raisin, DO  simvastatin (ZOCOR) 20 MG tablet Take 20 mg by mouth daily.    [provider]    Allergies    Aspirin, Penicillins, Amlodipine, Bactrim [sulfamethoxazole-trimethoprim], Contrast media [iodinated diagnostic agents], Iron, Nitrofurantoin, Tylenol [acetaminophen], Gabapentin, Hydralazine, Levofloxacin, No known allergies, Ranexa [ranolazine], Dexilant [dexlansoprazole], Levaquin [levofloxacin in d5w], Morphine and related, Plavix [clopidogrel bisulfate], Protonix [pantoprazole sodium], and Venofer [ferric oxide]  Review of Systems   Review of Systems  Cardiovascular:  Positive for chest pain.  All other systems reviewed and are negative.  Physical Exam Updated Vital Signs BP (!) 210/70 (BP Location: Right Arm)   Pulse 84   Temp 98.3 F (36.8 C) (Oral)   Resp 16   Ht 5\' 1"  (1.549 m)   Wt 47.6 kg   SpO2 95%   BMI 19.84 kg/m   Physical Exam Vitals and nursing note reviewed.  Constitutional:      General: She is not in acute distress.    Appearance: She is well-developed. She is not diaphoretic.  HENT:     Head: Normocephalic and atraumatic.  Cardiovascular:     Rate and Rhythm: Normal rate and regular rhythm.     Heart sounds: No murmur heard.   No friction rub. No gallop.  Pulmonary:     Effort: Pulmonary effort is normal. No respiratory distress.     Breath sounds: Normal breath sounds. No wheezing.  Abdominal:     General: Bowel sounds are  normal. There is no distension.     Palpations: Abdomen is soft.     Tenderness: There is no abdominal tenderness.  Musculoskeletal:        General: Normal range of motion.     Cervical back: Normal range of motion and neck supple.     Comments: The left knee appears grossly normal.  There is no deformity or effusion.  She is tender to palpation immediately below the patella and medial aspect of the proximal tibia.  Distal pulses are easily palpable and motor and sensation are intact throughout the entire foot.  Skin:    General: Skin is warm and dry.  Neurological:     General: No focal deficit present.     Mental Status: She is alert and oriented to person, place, and time.    ED Results / Procedures / Treatments  Labs (all labs ordered are listed, but only abnormal results are displayed) Labs Reviewed  BASIC METABOLIC PANEL  CBC  TROPONIN I (HIGH SENSITIVITY)    EKG EKG Interpretation  Date/Time:  Friday March 12 2021 03:35:05 EDT Ventricular Rate:  85 PR Interval:  176 QRS Duration: 126 QT Interval:  408 QTC Calculation: 486 R Axis:   78 Text Interpretation: Sinus rhythm Left bundle branch block No significant change since 02/27/2021 Confirmed by Veryl Speak [phone]) on 03/12/2021 4:20:57 AM  Radiology No results found.  Procedures Procedures   Medications Ordered in ED Medications - No data to display  ED Course  I have reviewed the triage vital signs and the nursing notes.  Pertinent labs & imaging results that were available during my care of the patient were reviewed by me and considered in my medical decision making (see chart for details).    MDM Rules/Calculators/A&P  Patient is an 80 year old female with history of end-stage renal disease on hemodialysis presenting with complaints of chest discomfort and dyspnea.  Patient found to be hypertensive and chest x-ray consistent with volume overload.  Her oxygen saturations are in the low 90s.  Patient  appears to be in need of dialysis.  Patient is due for dialysis today at 1030 at her center, however due to the hypoxia and chest discomfort, I discussed with Dr. Johnney Ou from nephrology.  She agrees that dialyzing her in the ER, then discharging if tolerated would be the most appropriate course of action.  Care signed out to Dr. Almyra Free at shift change.  Patient to be reassessed after dialysis with disposition pending.  CRITICAL CARE Performed by: Veryl Speak Total critical care time: 40 minutes Critical care time was exclusive of separately billable procedures and treating other patients. Critical care was necessary to treat or prevent imminent or life-threatening deterioration. Critical care was time spent personally by me on the following activities: development of treatment plan with patient and/or surrogate as well as nursing, discussions with consultants, evaluation of patient's response to treatment, examination of patient, obtaining history from patient or surrogate, ordering and performing treatments and interventions, ordering and review of laboratory studies, ordering and review of radiographic studies, pulse oximetry and re-evaluation of patient's condition.   Final Clinical Impression(s) / ED Diagnoses Final diagnoses:  None    Rx / DC Orders ED Discharge Orders     None        Veryl Speak, MD 03/13/21 [phone]

## 2021-03-12 NOTE — Procedures (Signed)
   HEMODIALYSIS TREATMENT NOTE:  Uneventful 3.5 hour heparin-free treatment completed via left upper arm AVG (17g/antegrade). Goal met: 1.5 liters removed without interruption in UF.  Hemodynamically stable.  Weaned to room air and saturating 100%.  Alert post-hD with no complaints and hopeful to go home.   , RN 

## 2021-03-12 NOTE — ED Notes (Signed)
Pt returned from Dialysis to the ER

## 2021-03-12 NOTE — ED Provider Notes (Signed)
Blood pressure 140/63, pulse (!) 59, temperature 98 F (36.7 C), temperature source Oral, resp. rate 20, height 5\' 1"  (1.549 m), weight 47.6 kg, SpO2 100 %.  In short, Shawna [name] is a 80 y.o. female with a chief complaint of Chest Pain and Leg Pain .  Refer to the original H&P for additional details.  03:53 PM  Patient completed HD and feeling much better. She is off O2 and ready for d/c.     Margette Fast, MD 03/15/21 [phone]

## 2021-03-12 NOTE — ED Notes (Signed)
Pt returned from dialysis

## 2021-03-12 NOTE — ED Notes (Signed)
Pt an her personal items to room 228.  Per Levada Dy pt will return to ER following for DC.

## 2021-03-12 NOTE — Discharge Instructions (Addendum)
Please keep your dialysis as scheduled. Discuss your symptoms with your PCP and use your walker at home as directed.

## 2021-03-12 NOTE — ED Notes (Signed)
Husband called to pick up pt.

## 2021-03-15 ENCOUNTER — Observation Stay (HOSPITAL_COMMUNITY)
Admission: EM | Admit: 2021-03-15 | Discharge: 2021-03-16 | Disposition: A | Discharge status: Home / Self Care | Payer: Medicare HMO | Attending: Internal Medicine | Admitting: Internal Medicine

## 2021-03-15 ENCOUNTER — Encounter (HOSPITAL_COMMUNITY): Payer: Self-pay | Admitting: Emergency Medicine

## 2021-03-15 ENCOUNTER — Emergency Department (HOSPITAL_COMMUNITY): Payer: Medicare HMO

## 2021-03-15 ENCOUNTER — Other Ambulatory Visit: Payer: Self-pay

## 2021-03-15 ENCOUNTER — Observation Stay (HOSPITAL_BASED_OUTPATIENT_CLINIC_OR_DEPARTMENT_OTHER): Payer: Medicare HMO

## 2021-03-15 DIAGNOSIS — R0789 Other chest pain: Principal | ICD-10-CM | POA: Insufficient documentation

## 2021-03-15 DIAGNOSIS — I5033 Acute on chronic diastolic (congestive) heart failure: Secondary | ICD-10-CM | POA: Diagnosis not present

## 2021-03-15 DIAGNOSIS — N186 End stage renal disease: Secondary | ICD-10-CM | POA: Insufficient documentation

## 2021-03-15 DIAGNOSIS — Z955 Presence of coronary angioplasty implant and graft: Secondary | ICD-10-CM | POA: Diagnosis not present

## 2021-03-15 DIAGNOSIS — I132 Hypertensive heart and chronic kidney disease with heart failure and with stage 5 chronic kidney disease, or end stage renal disease: Secondary | ICD-10-CM | POA: Diagnosis not present

## 2021-03-15 DIAGNOSIS — Z992 Dependence on renal dialysis: Secondary | ICD-10-CM

## 2021-03-15 DIAGNOSIS — Z7982 Long term (current) use of aspirin: Secondary | ICD-10-CM | POA: Insufficient documentation

## 2021-03-15 DIAGNOSIS — E782 Mixed hyperlipidemia: Secondary | ICD-10-CM | POA: Diagnosis present

## 2021-03-15 DIAGNOSIS — I5042 Chronic combined systolic (congestive) and diastolic (congestive) heart failure: Secondary | ICD-10-CM | POA: Diagnosis present

## 2021-03-15 DIAGNOSIS — Z20822 Contact with and (suspected) exposure to covid-19: Secondary | ICD-10-CM | POA: Diagnosis not present

## 2021-03-15 DIAGNOSIS — Z8616 Personal history of COVID-19: Secondary | ICD-10-CM | POA: Insufficient documentation

## 2021-03-15 DIAGNOSIS — Z79899 Other long term (current) drug therapy: Secondary | ICD-10-CM | POA: Insufficient documentation

## 2021-03-15 DIAGNOSIS — M25569 Pain in unspecified knee: Secondary | ICD-10-CM

## 2021-03-15 DIAGNOSIS — K219 Gastro-esophageal reflux disease without esophagitis: Secondary | ICD-10-CM | POA: Diagnosis present

## 2021-03-15 DIAGNOSIS — R079 Chest pain, unspecified: Secondary | ICD-10-CM | POA: Diagnosis not present

## 2021-03-15 DIAGNOSIS — E1122 Type 2 diabetes mellitus with diabetic chronic kidney disease: Secondary | ICD-10-CM | POA: Insufficient documentation

## 2021-03-15 DIAGNOSIS — M25562 Pain in left knee: Secondary | ICD-10-CM

## 2021-03-15 DIAGNOSIS — I480 Paroxysmal atrial fibrillation: Secondary | ICD-10-CM | POA: Diagnosis present

## 2021-03-15 DIAGNOSIS — Z951 Presence of aortocoronary bypass graft: Secondary | ICD-10-CM | POA: Insufficient documentation

## 2021-03-15 DIAGNOSIS — Z85038 Personal history of other malignant neoplasm of large intestine: Secondary | ICD-10-CM | POA: Insufficient documentation

## 2021-03-15 DIAGNOSIS — I2510 Atherosclerotic heart disease of native coronary artery without angina pectoris: Secondary | ICD-10-CM | POA: Diagnosis present

## 2021-03-15 DIAGNOSIS — R072 Precordial pain: Secondary | ICD-10-CM

## 2021-03-15 DIAGNOSIS — R778 Other specified abnormalities of plasma proteins: Secondary | ICD-10-CM | POA: Diagnosis present

## 2021-03-15 DIAGNOSIS — N189 Chronic kidney disease, unspecified: Secondary | ICD-10-CM | POA: Diagnosis present

## 2021-03-15 DIAGNOSIS — D631 Anemia in chronic kidney disease: Secondary | ICD-10-CM | POA: Diagnosis not present

## 2021-03-15 DIAGNOSIS — E785 Hyperlipidemia, unspecified: Secondary | ICD-10-CM | POA: Diagnosis present

## 2021-03-15 DIAGNOSIS — I5022 Chronic systolic (congestive) heart failure: Secondary | ICD-10-CM | POA: Diagnosis present

## 2021-03-15 DIAGNOSIS — E119 Type 2 diabetes mellitus without complications: Secondary | ICD-10-CM

## 2021-03-15 DIAGNOSIS — I5032 Chronic diastolic (congestive) heart failure: Secondary | ICD-10-CM | POA: Diagnosis present

## 2021-03-15 DIAGNOSIS — E039 Hypothyroidism, unspecified: Secondary | ICD-10-CM | POA: Diagnosis not present

## 2021-03-15 DIAGNOSIS — R7989 Other specified abnormal findings of blood chemistry: Secondary | ICD-10-CM | POA: Diagnosis present

## 2021-03-15 DIAGNOSIS — I10 Essential (primary) hypertension: Secondary | ICD-10-CM | POA: Diagnosis present

## 2021-03-15 HISTORY — DX: Acute embolism and thrombosis of right internal jugular vein: I82.C11

## 2021-03-15 LAB — BASIC METABOLIC PANEL
Anion gap: 11 (ref 5–15)
BUN: 37 mg/dL — ABNORMAL HIGH (ref 8–23)
CO2: 27 mmol/L (ref 22–32)
Calcium: 8.9 mg/dL (ref 8.9–10.3)
Chloride: 98 mmol/L (ref 98–111)
Creatinine, Ser: 7.76 mg/dL — ABNORMAL HIGH (ref 0.44–1.00)
GFR, Estimated: 5 mL/min — ABNORMAL LOW (ref >60)
Glucose, Bld: 104 mg/dL — ABNORMAL HIGH (ref 70–99)
Potassium: 4.1 mmol/L (ref 3.5–5.1)
Sodium: 136 mmol/L (ref 135–145)

## 2021-03-15 LAB — RESP PANEL BY RT-PCR (FLU A&B, COVID) ARPGX2
Influenza A by PCR: NEGATIVE
Influenza B by PCR: NEGATIVE
SARS Coronavirus 2 by RT PCR: NEGATIVE

## 2021-03-15 LAB — TROPONIN I (HIGH SENSITIVITY)
Troponin I (High Sensitivity): 54 ng/L — ABNORMAL HIGH (ref <18)
Troponin I (High Sensitivity): 66 ng/L — ABNORMAL HIGH (ref <18)

## 2021-03-15 LAB — CBC
HCT: 31.4 % — ABNORMAL LOW (ref 36.0–46.0)
Hemoglobin: 9.5 g/dL — ABNORMAL LOW (ref 12.0–15.0)
MCH: 34.8 pg — ABNORMAL HIGH (ref 26.0–34.0)
MCHC: 30.3 g/dL (ref 30.0–36.0)
MCV: 115 fL — ABNORMAL HIGH (ref 80.0–100.0)
Platelets: 293 10*3/uL (ref 150–400)
RBC: 2.73 MIL/uL — ABNORMAL LOW (ref 3.87–5.11)
RDW: 21.2 % — ABNORMAL HIGH (ref 11.5–15.5)
WBC: 8.1 10*3/uL (ref 4.0–10.5)
nRBC: 0 % (ref 0.0–0.2)

## 2021-03-15 IMAGING — DX DG KNEE COMPLETE 4+V*L*
4 series · 4 of 4 positions shown · non-contrast
Comparison: None.

CLINICAL DATA: Left leg pain

EXAM:
LEFT KNEE - COMPLETE 4+ VIEW

[knee ap]
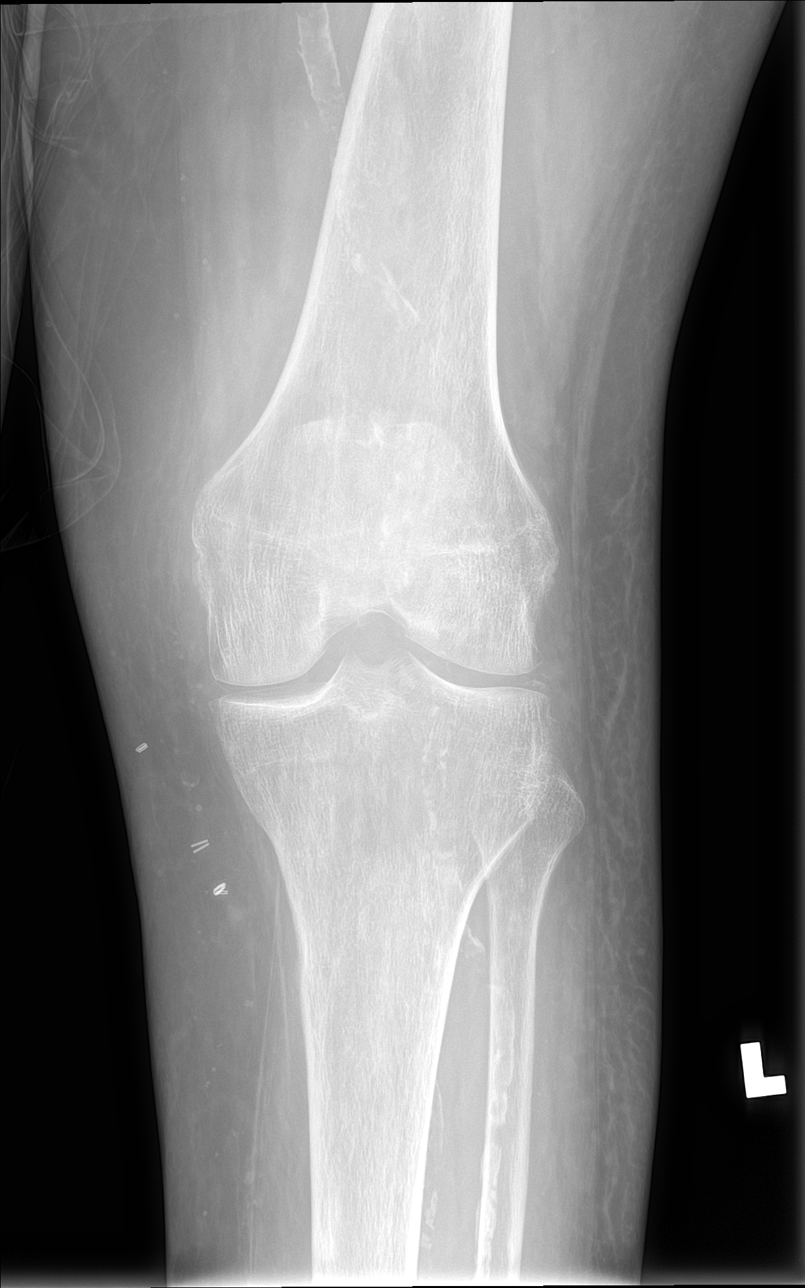

[knee obl (1 of 2)]
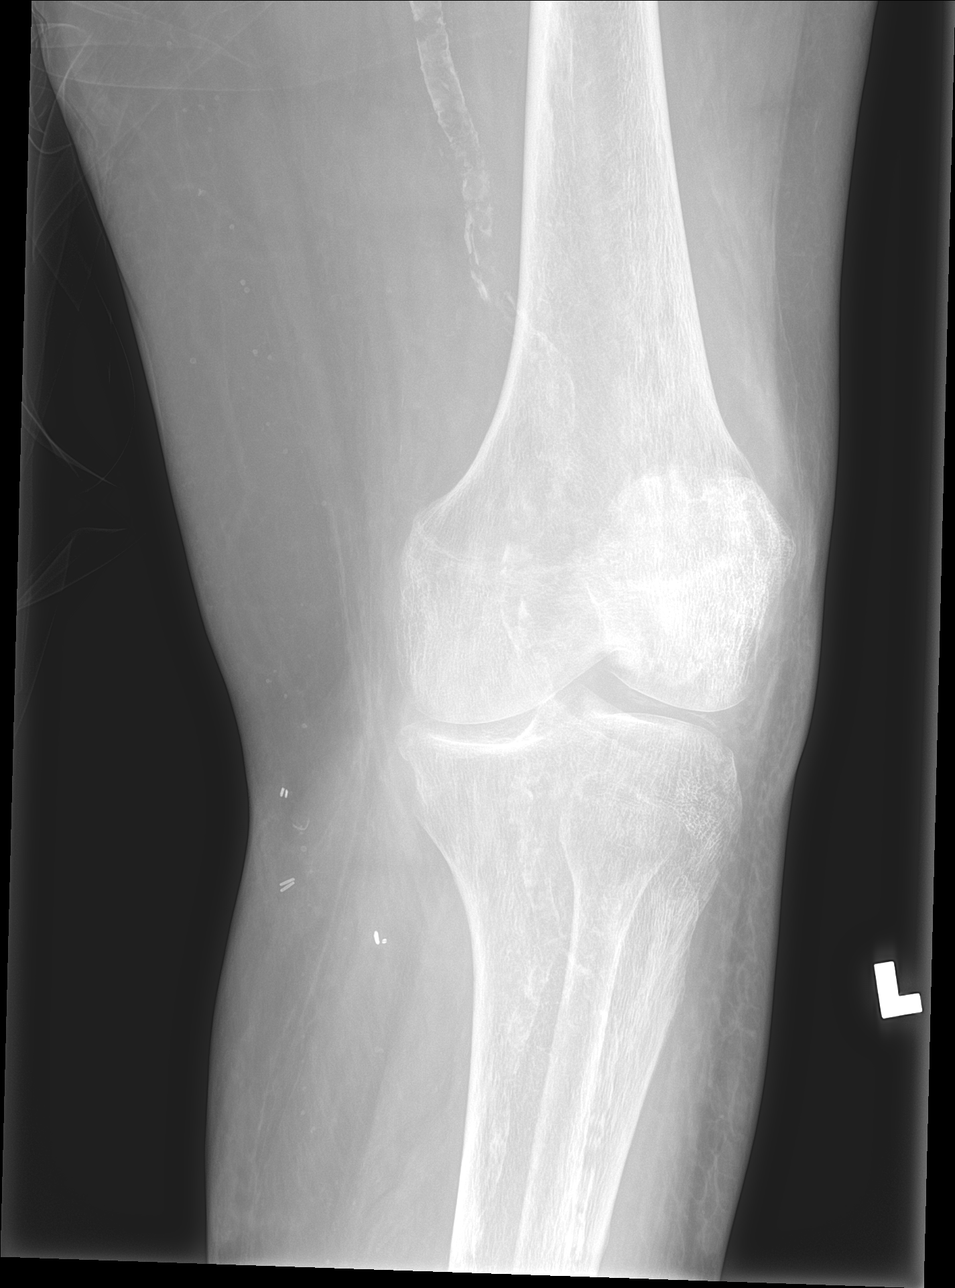

[knee obl (2 of 2)]
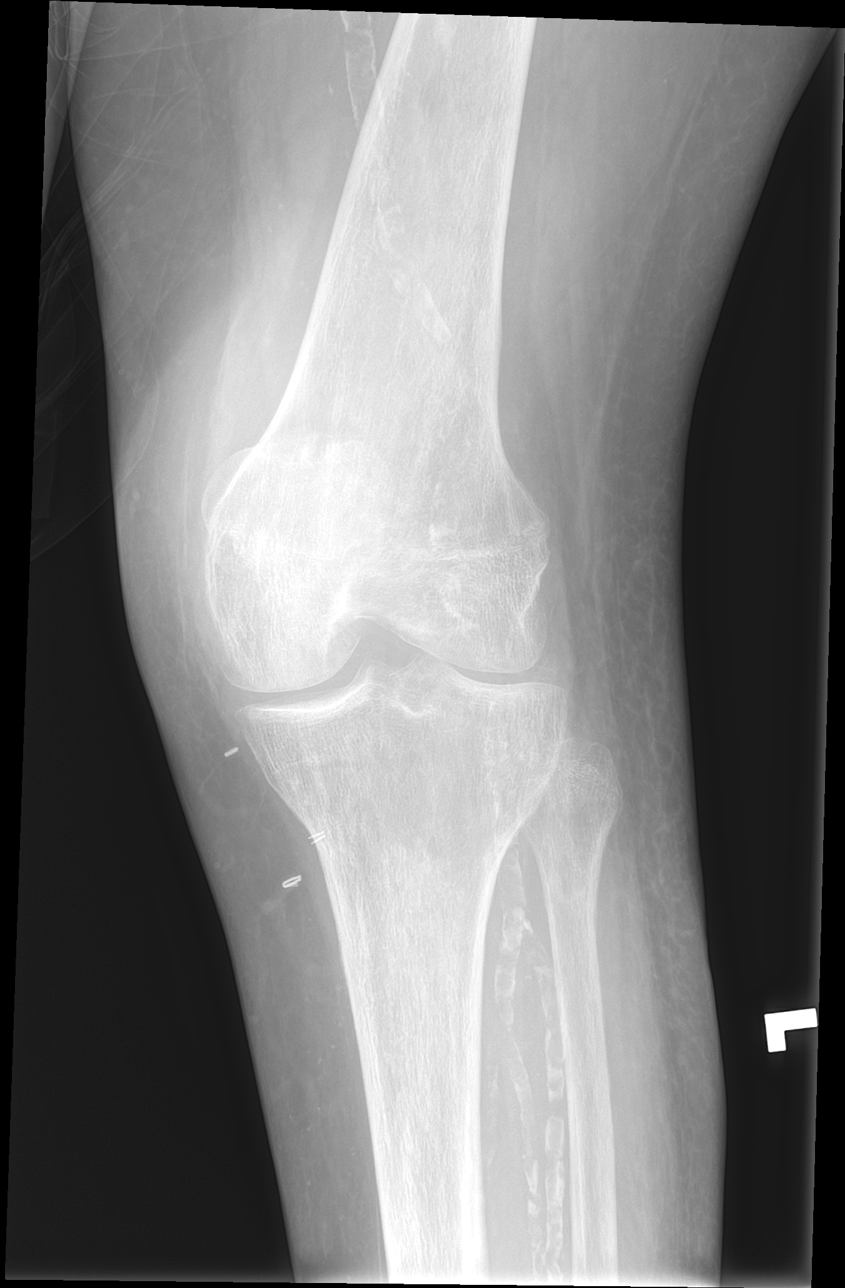

[knee lat]
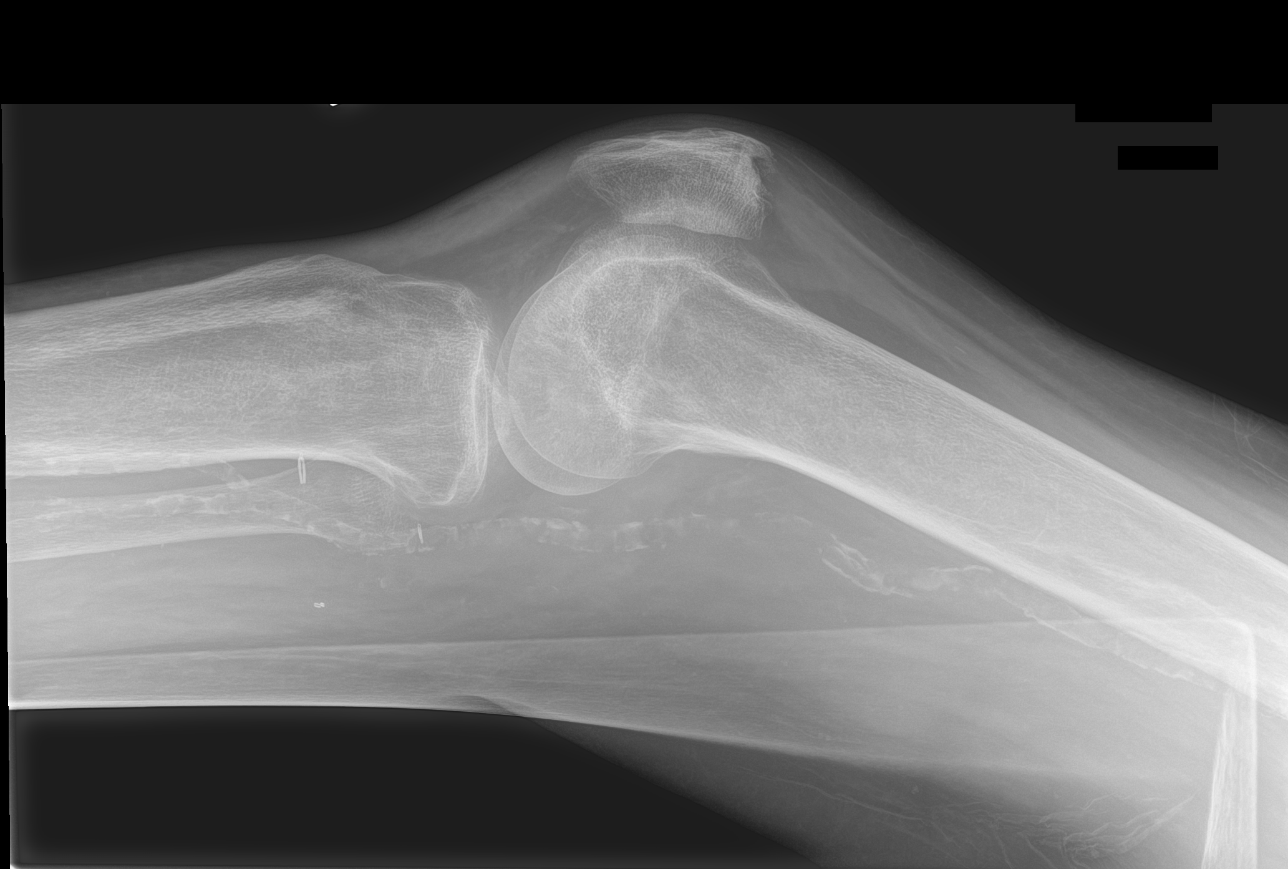

[4 of 4 positions shown; findings below may reference images not displayed]

FINDINGS: No acute bony abnormality. Specifically, no fracture, subluxation,
or dislocation. No joint effusion. Joint spaces maintained. Vascular
calcifications noted.
IMPRESSION: No acute bony abnormality.

## 2021-03-15 IMAGING — DX DG CHEST 1V PORT
1 series · 1 of 1 positions shown · non-contrast
Comparison: [DATE]

CLINICAL DATA: Chest pain

EXAM:
PORTABLE CHEST 1 VIEW

[chest ap]
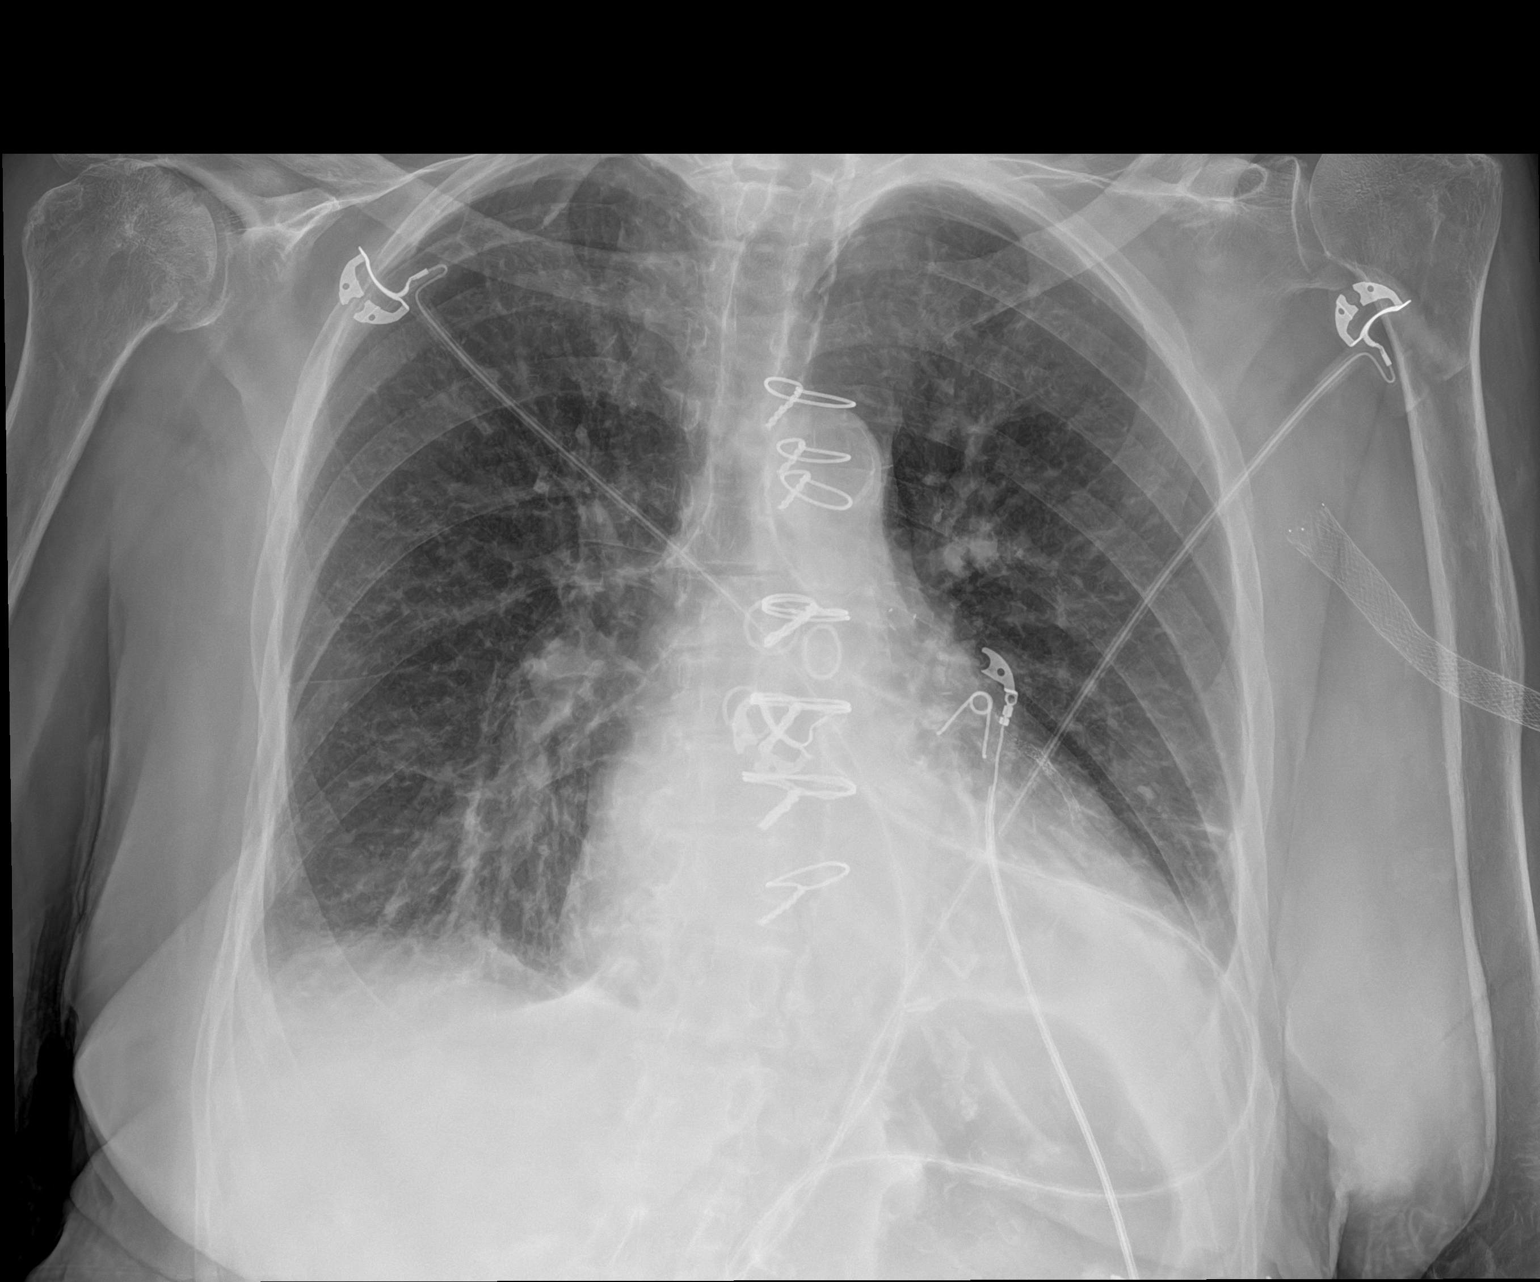

[1 of 1 positions shown; findings below may reference images not displayed]

FINDINGS: Cardiomegaly with vascular congestion. Prior CABG. Bibasilar
airspace opacities with small effusions, similar to prior study. No
overt edema. No acute bony abnormality.
IMPRESSION: Cardiomegaly, vascular congestion.

Bibasilar atelectasis or infiltrates with small effusions.

## 2021-03-15 MED ORDER — ENOXAPARIN SODIUM 30 MG/0.3ML IJ SOSY: 30.0000 mg | PREFILLED_SYRINGE | INTRAMUSCULAR | Status: DC

## 2021-03-15 MED ORDER — DARBEPOETIN ALFA 100 MCG/0.5ML IJ SOSY
100.0000 ug | PREFILLED_SYRINGE | INTRAMUSCULAR | Status: DC
  Filled 2021-03-15: qty 0.5

## 2021-03-15 MED ORDER — PANTOPRAZOLE SODIUM 40 MG PO TBEC
40.0000 mg | DELAYED_RELEASE_TABLET | Freq: Every day | ORAL | Status: DC
  Administered 2021-03-15 – 2021-03-16 (×2): 40 mg via ORAL
  Filled 2021-03-15 (×2): qty 1

## 2021-03-15 MED ORDER — ENOXAPARIN SODIUM 300 MG/3ML IJ SOLN
20.0000 mg | INTRAMUSCULAR | Status: DC
  Filled 2021-03-15: qty 0.2

## 2021-03-15 MED ORDER — METOPROLOL TARTRATE 25 MG PO TABS
12.5000 mg | ORAL_TABLET | Freq: Two times a day (BID) | ORAL | Status: DC
  Administered 2021-03-15 – 2021-03-16 (×3): 12.5 mg via ORAL
  Filled 2021-03-15 (×3): qty 1

## 2021-03-15 MED ORDER — MIDODRINE HCL 5 MG PO TABS: 5.0000 mg | ORAL_TABLET | ORAL | Status: DC

## 2021-03-15 MED ORDER — LIDOCAINE-PRILOCAINE 2.5-2.5 % EX CREA: 1.0000 "application " | TOPICAL_CREAM | CUTANEOUS | Status: DC | PRN

## 2021-03-15 MED ORDER — CHLORHEXIDINE GLUCONATE CLOTH 2 % EX PADS
6.0000 | MEDICATED_PAD | Freq: Every day | CUTANEOUS | Status: DC
  Administered 2021-03-16: 6 via TOPICAL

## 2021-03-15 MED ORDER — PENTAFLUOROPROP-TETRAFLUOROETH EX AERO: 1.0000 "application " | INHALATION_SPRAY | CUTANEOUS | Status: DC | PRN

## 2021-03-15 MED ORDER — NITROGLYCERIN 2 % TD OINT
1.0000 [in_us] | TOPICAL_OINTMENT | Freq: Once | TRANSDERMAL | Status: AC
  Administered 2021-03-15: 1 [in_us] via TOPICAL
  Filled 2021-03-15: qty 1

## 2021-03-15 MED ORDER — BENZONATATE 100 MG PO CAPS: 100.0000 mg | ORAL_CAPSULE | Freq: Four times a day (QID) | ORAL | Status: DC | PRN

## 2021-03-15 MED ORDER — FLUTICASONE PROPIONATE 50 MCG/ACT NA SUSP: 1.0000 | Freq: Every evening | NASAL | Status: DC | PRN

## 2021-03-15 MED ORDER — SODIUM CHLORIDE 0.9 % IV SOLN: 100.0000 mL | INTRAVENOUS | Status: DC | PRN

## 2021-03-15 MED ORDER — LOPERAMIDE HCL 2 MG PO CAPS: 4.0000 mg | ORAL_CAPSULE | Freq: Every day | ORAL | Status: DC | PRN

## 2021-03-15 MED ORDER — ATORVASTATIN CALCIUM 10 MG PO TABS
10.0000 mg | ORAL_TABLET | Freq: Every day | ORAL | Status: DC
  Administered 2021-03-15 – 2021-03-16 (×2): 10 mg via ORAL
  Filled 2021-03-15 (×2): qty 1

## 2021-03-15 MED ORDER — HYDROXYZINE HCL 50 MG PO TABS
100.0000 mg | ORAL_TABLET | Freq: Every day | ORAL | Status: DC | PRN
  Filled 2021-03-15: qty 2

## 2021-03-15 MED ORDER — LIDOCAINE HCL (PF) 1 % IJ SOLN: 5.0000 mL | INTRAMUSCULAR | Status: DC | PRN

## 2021-03-15 MED ORDER — LEVOTHYROXINE SODIUM 25 MCG PO TABS
25.0000 ug | ORAL_TABLET | Freq: Every day | ORAL | Status: DC
  Administered 2021-03-15 – 2021-03-16 (×2): 25 ug via ORAL
  Filled 2021-03-15 (×2): qty 1

## 2021-03-15 MED ORDER — SEVELAMER CARBONATE 800 MG PO TABS
1600.0000 mg | ORAL_TABLET | Freq: Three times a day (TID) | ORAL | Status: DC
  Administered 2021-03-15 – 2021-03-16 (×4): 1600 mg via ORAL
  Filled 2021-03-15 (×4): qty 2

## 2021-03-15 MED ORDER — RENA-VITE PO TABS
1.0000 | ORAL_TABLET | Freq: Every day | ORAL | Status: DC
  Administered 2021-03-15 – 2021-03-16 (×2): 1 via ORAL
  Filled 2021-03-15 (×2): qty 1

## 2021-03-15 MED ORDER — DARBEPOETIN ALFA 100 MCG/0.5ML IJ SOSY
PREFILLED_SYRINGE | INTRAMUSCULAR | Status: AC
  Administered 2021-03-15: 100 ug via INTRAVENOUS
  Filled 2021-03-15: qty 0.5

## 2021-03-15 MED ORDER — PENTAFLUOROPROP-TETRAFLUOROETH EX AERO
INHALATION_SPRAY | CUTANEOUS | Status: AC
  Administered 2021-03-15: 1 via TOPICAL
  Filled 2021-03-15: qty 116

## 2021-03-15 MED ORDER — DICLOFENAC SODIUM 1 % EX GEL
2.0000 g | Freq: Four times a day (QID) | CUTANEOUS | Status: DC | PRN
  Administered 2021-03-16: 2 g via TOPICAL
  Filled 2021-03-15 (×2): qty 100

## 2021-03-15 MED ORDER — ISOSORBIDE MONONITRATE ER 60 MG PO TB24
60.0000 mg | ORAL_TABLET | Freq: Every day | ORAL | Status: DC
  Administered 2021-03-15 – 2021-03-16 (×2): 60 mg via ORAL
  Filled 2021-03-15 (×2): qty 1

## 2021-03-15 MED ORDER — ASPIRIN EC 81 MG PO TBEC
81.0000 mg | DELAYED_RELEASE_TABLET | Freq: Every day | ORAL | Status: DC
  Administered 2021-03-15 – 2021-03-16 (×2): 81 mg via ORAL
  Filled 2021-03-15 (×2): qty 1

## 2021-03-15 MED ORDER — ONDANSETRON HCL 4 MG/2ML IJ SOLN: 4.0000 mg | Freq: Four times a day (QID) | INTRAMUSCULAR | Status: DC | PRN

## 2021-03-15 NOTE — Procedures (Signed)
   HEMODIALYSIS TREATMENT NOTE:  Uneventful 3.5 hour heparin-free treatment completed via left upper arm AVG (15g/antegrade). Goal nearly met: 1.5 liters removed.  Labile BPs as per usual.  No chest pain since this morning.  No dyspnea.  She is complaining of leg pain from her fall last week.  No changes from pre-HD assessment.  Rockwell Alexandria, RN

## 2021-03-15 NOTE — ED Triage Notes (Signed)
Pt c/o continued chest pain since being seen here Thursday for the same. Pt also c/o continued left lower leg pain that she was seen for as well.

## 2021-03-15 NOTE — ED Notes (Signed)
Delay obtaining labs pt unable to tolerate access for RN Mitzi Hansen P.

## 2021-03-15 NOTE — ED Notes (Signed)
Lab called requesting collection of second trop

## 2021-03-15 NOTE — H&P (Signed)
History and Physical    BETHLEHEM LANGSTAFF ASN:[id number] DOB: 1941-03-18 DOA: 03/15/2021  PCP: [facility]   Patient coming from: Home.   I have personally briefly reviewed patient's old medical records in [place]  Chief Complaint: Chest pain.  HPI: Shawna [name] is a 80 y.o. female with medical history significant of anxiety, osteoarthritis, colon AVM, CAD, chronic bronchitis, history of colon cancer, esophageal stricture, ESRD on hemodialysis MWF, GERD, hyperlipidemia, gout, PUD, history of unspecified GI bleeding, history of blood transfusion, hypertension, iron deficiency anemia, history of pneumonia, TIA, right jugular vein occlusion, mitral regurgitation, NSVT, paroxysmal atrial fibrillation, CABG in 2013, DES to D1 in April 2018, carotid artery disease, chronic diastolic heart failure who is coming to the emergency department due to her typical anginal pain that was more intense than usual and took 3 SL NTG tablets to improve.  The patient states that she normally gets relief with 1 tablet.  Her pain radiates to her back and left shoulder.  She felt mildly dyspneic, but denied diaphoresis, nausea, emesis, lightheadedness or palpitations.  However, the patient stated several hours earlier in the evening she felt a brief episode of palpitations with no other symptoms.  She gets occasional lower extremity edema, deny PND or orthopnea.  She denied fever, chills, rhinorrhea, sore throat, productive cough, wheezing or hemoptysis.  No appetite changes, abdominal pain, constipation, melena or hematochezia.  She had a self-limited episode of diarrhea several days ago.  She no longer urinates after developing ESRD.  She has frequent insomnia.   The patient also stated that she continues to have left knee pain.  She was seen on Friday morning due to left knee pain following a fall at home.  Knee x-ray x2 have not shown any acute abnormality.  ED Course: Initial vital signs were  temperature 98.4 F, pulse 81, respiration 15, BP 169/63 mmHg O2 sat 95% on room air.  Lab work: Her CBC is her white count of 8.1, hemoglobin 9.5 g/dL platelets 293.  Troponin was 54 and then 66 ng/L.  BMP showed normal electrolytes.  Glucose 104, BUN 37 creatinine 7.76 mg/dL.  Imaging: One-view portable chest radiograph showed cardiomegaly with vascular congestion.  There was bibasilar rectal ectasis versus infiltrates with small effusions.  Left knee 4 view did not show any acute bony abnormality.  However, on the knee x-ray from 3 days ago there was question for meniscal calcification chondrocalcinosis.  Please see images and full radiology report for further detail.  Review of Systems: As per HPI otherwise all other systems reviewed and are negative.  Past Medical History:  Diagnosis Date   Acute on chronic respiratory failure with hypoxia ([place]) 10/10/2016   Anxiety    Arthritis    AVM (arteriovenous malformation) of colon    CAD (coronary artery disease)    a. s/p CABG in 2013 b. DES to D1 in 10/2016. c. cath in 07/2018 showing patent grafts with occlusion of D1 at prior stent site and progression of PDA disease --> medical management recommended   Carotid artery disease ([place])    a. 19-37% LICA, 03/239    Chronic anemia    Chronic bronchitis (HCC)    Chronic diastolic CHF (congestive heart failure) ([place])    a. 02/2012 Echo EF 60-65%, nl wall motion, Gr 1 DD, mod MR   Colon cancer ([place]) 1992   Esophageal stricture    ESRD on hemodialysis ([place])    ESRD due to HTN, started dialysis 2011  and gets HD at [facility] with Dr Hinda Lenis on MWF schedule.  Access is LUA AVF as of Sept 2014.    GERD (gastroesophageal reflux disease)    High cholesterol 12/2011   History of blood transfusion 07/2011; 12/2011; 01/2012 X 2; 04/2012   History of gout    History of lower GI bleeding    Hypertension    Iron deficiency anemia    Jugular vein occlusion, right (HCC)    Mitral regurgitation    a.  Moderate by echo, 02/2012   Myocardial infarction [facility])    NSVT (nonsustained ventricular tachycardia) ([place])    Ovarian cancer ([place]) 1992   PAF (paroxysmal atrial fibrillation) ([place])    Pneumonia ~ 2009   PUD (peptic ulcer disease)    TIA (transient ischemic attack)    Past Surgical History:  Procedure Laterality Date   A/V SHUNTOGRAM Left 03/19/2019   Procedure: A/V SHUNTOGRAM;  Surgeon: Katha Cabal, MD;  Location: [place] CV LAB;  Service: Cardiovascular;  Laterality: Left;   ABDOMINAL HYSTERECTOMY  1992   APPENDECTOMY  06/1990   AV FISTULA PLACEMENT  07/2009   left upper arm   AV FISTULA PLACEMENT Right 09/06/2016   Procedure: RIGHT FOREARM ARTERIOVENOUS (AV) GRAFT;  Surgeon: Elam Dutch, MD;  Location: [place];  Service: Vascular;  Laterality: Right;   AV FISTULA PLACEMENT N/A 02/24/2017   Procedure: INSERTION OF ARTERIOVENOUS (AV) GORE-TEX GRAFT ARM (BRACHIAL AXILLARY);  Surgeon: Katha Cabal, MD;  Location: [facility] ORS;  Service: Vascular;  Laterality: N/A;   [place] Right 09/06/2016   Procedure: REMOVAL OF Right Arm ARTERIOVENOUS GORETEX GRAFT and Vein Patch angioplasty of brachial artery;  Surgeon: Angelia Mould, MD;  Location: [place];  Service: Vascular;  Laterality: Right;   BIOPSY  09/26/2019   Procedure: BIOPSY;  Surgeon: Rogene Houston, MD;  [facility];  Service: Endoscopy;;   COLON RESECTION  1992   COLON SURGERY     COLONOSCOPY N/A 03/09/2019   Procedure: COLONOSCOPY;  Surgeon: Rogene Houston, MD;  [facility];  Service: Endoscopy;  Laterality: N/A;   CORONARY ANGIOPLASTY WITH STENT PLACEMENT  12/15/11   "2"   CORONARY ANGIOPLASTY WITH STENT PLACEMENT  y/2013   "1; makes total of 3" (05/02/2012)   CORONARY ARTERY BYPASS GRAFT  06/13/2012   Procedure: CORONARY ARTERY BYPASS GRAFTING (CABG);  Surgeon: Grace Isaac, MD;  Location: [place];  Service: Open Heart Surgery;  Laterality: N/A;  cabg x four;  using left internal  mammary artery, and left leg greater saphenous vein harvested endoscopically   CORONARY STENT INTERVENTION N/A 10/13/2016   Procedure: Coronary Stent Intervention;  Surgeon: Troy Sine, MD;  Location: [place] CV LAB;  Service: Cardiovascular;  Laterality: N/A;   DIALYSIS/PERMA CATHETER REMOVAL N/A 04/18/2017   Procedure: DIALYSIS/PERMA CATHETER REMOVAL;  Surgeon: Katha Cabal, MD;  Location: [place] CV LAB;  Service: Cardiovascular;  Laterality: N/A;   DILATION AND CURETTAGE OF UTERUS     ESOPHAGOGASTRODUODENOSCOPY  01/20/2012   Procedure: ESOPHAGOGASTRODUODENOSCOPY (EGD);  Surgeon: Ladene Artist, MD,FACG;  Location: [facility] ENDOSCOPY;  Service: Endoscopy;  Laterality: N/A;   ESOPHAGOGASTRODUODENOSCOPY N/A 03/26/2013   Procedure: ESOPHAGOGASTRODUODENOSCOPY (EGD);  Surgeon: Irene Shipper, MD;  Location: [facility] ENDOSCOPY;  Service: Endoscopy;  Laterality: N/A;   ESOPHAGOGASTRODUODENOSCOPY N/A 04/30/2015   Procedure: ESOPHAGOGASTRODUODENOSCOPY (EGD);  Surgeon: Rogene Houston, MD;  [facility];  Service: Endoscopy;  Laterality: N/A;  1pm -  moved to 10/20 @ 1:10   ESOPHAGOGASTRODUODENOSCOPY N/A 07/29/2016   Procedure: ESOPHAGOGASTRODUODENOSCOPY (EGD);  Surgeon: Manus Gunning, MD;  Location: [place];  Service: Gastroenterology;  Laterality: N/A;  enteroscopy   ESOPHAGOGASTRODUODENOSCOPY N/A 09/26/2019   Procedure: ESOPHAGOGASTRODUODENOSCOPY (EGD);  Surgeon: Rogene Houston, MD;  [facility];  Service: Endoscopy;  Laterality: N/A;  1250   ESOPHAGOGASTRODUODENOSCOPY (EGD) WITH PROPOFOL N/A 02/05/2021   Procedure: ESOPHAGOGASTRODUODENOSCOPY (EGD) WITH PROPOFOL;  Surgeon: Eloise Harman, DO;  [facility];  Service: Endoscopy;  Laterality: N/A;   GIVENS CAPSULE STUDY N/A 03/07/2019   Procedure: GIVENS CAPSULE STUDY;  Surgeon: Rogene Houston, MD;  [facility];  Service: Endoscopy;  Laterality: N/A;  7:30   INSERTION OF DIALYSIS CATHETER  N/A 10/05/2020   Procedure: ABORTED TUNNELED DIALYSIS CATHETER PLACEMENT RIGHT INTERNAL JUGULAR VEIN ;  Surgeon: Virl Cagey, MD;  [facility];  Service: General;  Laterality: N/A;   INTRAOPERATIVE TRANSESOPHAGEAL ECHOCARDIOGRAM  06/13/2012   Procedure: INTRAOPERATIVE TRANSESOPHAGEAL ECHOCARDIOGRAM;  Surgeon: Grace Isaac, MD;  Location: [place];  Service: Open Heart Surgery;  Laterality: N/A;   IR DIALY SHUNT INTRO NEEDLE/INTRACATH INITIAL W/IMG LEFT Left 10/06/2020   IR FLUORO GUIDE CV LINE RIGHT  06/17/2020   IR GENERIC HISTORICAL  07/26/2016   IR FLUORO GUIDE CV LINE RIGHT 07/26/2016 Greggory Keen, MD [facility]   IR GENERIC HISTORICAL  07/26/2016   IR US GUIDE VASC ACCESS RIGHT 07/26/2016 Greggory Keen, MD [facility]   IR GENERIC HISTORICAL  08/02/2016   IR US GUIDE VASC ACCESS RIGHT 08/02/2016 Greggory Keen, MD [facility]   IR GENERIC HISTORICAL  08/02/2016   IR FLUORO GUIDE CV LINE RIGHT 08/02/2016 Greggory Keen, MD [facility]   IR RADIOLOGY PERIPHERAL GUIDED IV START  03/28/2017   IR REMOVAL TUN CV CATH W/O FL  08/11/2020   IR THROMBECTOMY AV FISTULA W/THROMBOLYSIS INC/SHUNT/IMG LEFT Left 06/17/2020   IR US GUIDE VASC ACCESS LEFT  06/17/2020   IR US GUIDE VASC ACCESS RIGHT  03/28/2017   IR US GUIDE VASC ACCESS RIGHT  06/17/2020   LEFT HEART CATH AND CORONARY ANGIOGRAPHY N/A 09/20/2016   Procedure: Left Heart Cath and Coronary Angiography;  Surgeon: [name], MD;  Location: [place] CV LAB;  Service: Cardiovascular;  Laterality: N/A;   LEFT HEART CATH AND CORS/GRAFTS ANGIOGRAPHY N/A 10/13/2016   Procedure: Left Heart Cath and Cors/Grafts Angiography;  Surgeon: Troy Sine, MD;  Location: [place] [facility] LAB;  Service: Cardiovascular;  Laterality: N/A;   LEFT HEART CATH AND CORS/GRAFTS ANGIOGRAPHY N/A 07/13/2018   Procedure: LEFT HEART CATH AND CORS/GRAFTS ANGIOGRAPHY;  Surgeon: Martinique, Peter M, MD;  Location: [place] CV LAB;  Service: Cardiovascular;  Laterality: N/A;    LEFT HEART CATHETERIZATION WITH CORONARY ANGIOGRAM N/A 12/15/2011   Procedure: LEFT HEART CATHETERIZATION WITH CORONARY ANGIOGRAM;  Surgeon: Burnell Blanks, MD;  Location: [facility] CATH LAB;  Service: Cardiovascular;  Laterality: N/A;   LEFT HEART CATHETERIZATION WITH CORONARY ANGIOGRAM N/A 01/10/2012   Procedure: LEFT HEART CATHETERIZATION WITH CORONARY ANGIOGRAM;  Surgeon: Peter M Martinique, MD;  Location: [facility] CATH LAB;  Service: Cardiovascular;  Laterality: N/A;   LEFT HEART CATHETERIZATION WITH CORONARY ANGIOGRAM N/A 06/08/2012   Procedure: LEFT HEART CATHETERIZATION WITH CORONARY ANGIOGRAM;  Surgeon: Burnell Blanks, MD;  Location: [facility] CATH LAB;  Service: Cardiovascular;  Laterality: N/A;   LEFT HEART CATHETERIZATION WITH CORONARY/GRAFT ANGIOGRAM N/A 12/10/2013   Procedure: LEFT HEART CATHETERIZATION WITH  Beatrix Fetters;  Surgeon: [name], MD;  Location: [facility] CATH LAB;  Service: Cardiovascular;  Laterality: N/A;   OVARY SURGERY     ovarian cancer   POLYPECTOMY  03/09/2019   Procedure: POLYPECTOMY;  Surgeon: Rogene Houston, MD;  [facility];  Service: Endoscopy;;  cecal    POLYPECTOMY N/A 09/26/2019   Procedure: DUODENAL POLYPECTOMY;  Surgeon: Rogene Houston, MD;  [facility];  Service: Endoscopy;  Laterality: N/A;   REVISION OF ARTERIOVENOUS GORETEX GRAFT N/A 02/24/2017   Procedure: REVISION OF ARTERIOVENOUS GORETEX GRAFT (RESECTION);  Surgeon: Katha Cabal, MD;  Location: [facility] ORS;  Service: Vascular;  Laterality: N/A;   REVISON OF ARTERIOVENOUS FISTULA Left 06/19/2020   Procedure: REVISION OF LEFT UPPER ARM AV GRAFT WITH INTERPOSITION JUMP GRAFT USING 6MM GORE LIMB;  Surgeon: Marty Heck, MD;  Location: [place];  Service: Vascular;  Laterality: Left;   SHUNTOGRAM N/A 10/15/2013   Procedure: Fistulogram;  Surgeon: Serafina Mitchell, MD;  Location: [facility] CATH LAB;  Service: Cardiovascular;  Laterality: N/A;   THROMBECTOMY / ARTERIOVENOUS  GRAFT REVISION  2011   left upper arm   TUBAL LIGATION  1980's   UPPER EXTREMITY ANGIOGRAPHY Bilateral 12/06/2016   Procedure: Upper Extremity Angiography;  Surgeon: Katha Cabal, MD;  Location: [place] CV LAB;  Service: Cardiovascular;  Laterality: Bilateral;   UPPER EXTREMITY INTERVENTION Left 06/06/2017   Procedure: UPPER EXTREMITY INTERVENTION;  Surgeon: Katha Cabal, MD;  Location: [place] [facility] LAB;  Service: Cardiovascular;  Laterality: Left;   Social History  reports that she has never smoked. She has never used smokeless tobacco. She reports that she does not drink alcohol and does not use drugs.  Allergies  Allergen Reactions   Aspirin Other (See Comments)    High Doses Mess up her stomach; "makes my bowels have blood in them". Takes 81 mg EC Aspirin    Penicillins Other (See Comments)    SYNCOPE? , "makes me real weak when I take it; like I'll pass out"  Has patient had a PCN reaction causing immediate rash, facial/tongue/throat swelling, SOB or lightheadedness with hypotension: Yes Has patient had a PCN reaction causing severe rash involving mucus membranes or skin necrosis: no Has patient had a PCN reaction that required hospitalization no Has patient had a PCN reaction occurring within the last 10 years: no If all of the above   Amlodipine Swelling   Bactrim [Sulfamethoxazole-Trimethoprim] Rash   Contrast Media [Iodinated Diagnostic Agents] Itching   Iron Itching and Other (See Comments)    "they gave me iron in dialysis; had to give me Benadryl cause I had to have the iron" (05/02/2012)   Nitrofurantoin Hives   Tylenol [Acetaminophen] Itching and Other (See Comments)    Makes her feet on fire per pt   Gabapentin Other (See Comments)    Unknown reaction   Hydralazine Itching    Has tolerated while inpatient   Levofloxacin    No Known Allergies    Ranexa [Ranolazine] Other (See Comments)    Myoclonus-hospitalized    Dexilant [Dexlansoprazole]  Other (See Comments)    Upset stomach   Levaquin [Levofloxacin In D5w] Rash   Morphine And Related Itching and Other (See Comments)    Itching in feet   Plavix [Clopidogrel Bisulfate] Rash   Protonix [Pantoprazole Sodium] Rash   Venofer [Ferric Oxide] Itching and Other (See Comments)    Patient reports using Benadryl prior to doses as [facility]   Family History  Problem Relation Age of Onset   Heart disease Mother        Heart Disease before age 2   Hyperlipidemia Mother    Hypertension Mother    Diabetes Mother    Heart attack Mother    Heart disease Father        Heart Disease before age 71   Hyperlipidemia Father    Hypertension Father    Diabetes Father    Diabetes Sister    Hypertension Sister    Diabetes Brother    Hyperlipidemia Brother    Heart attack Brother    Hypertension Sister    Heart attack Brother    Colon cancer Child 76   Other Other        noncontributory for early CAD   Esophageal cancer Neg Hx    Liver disease Neg Hx    Kidney disease Neg Hx    Colon polyps Neg Hx    Prior to Admission medications   Medication Sig Start Date End Date Taking? Authorizing Provider  aspirin EC 81 MG tablet Take 81 mg by mouth daily.  09/27/19   Rehman, Mechele Dawley, MD  atorvastatin (LIPITOR) 10 MG tablet Take 10 mg by mouth daily.    [provider]  benzonatate (TESSALON PERLES) 100 MG capsule Take 1 capsule (100 mg total) by mouth every 6 (six) hours as needed for cough. 02/28/21   Patrecia Pour, MD  diclofenac Sodium (VOLTAREN) 1 % GEL Apply 2 g topically 4 (four) times daily as needed. 03/12/21   Long, Wonda Olds, MD  fluticasone (FLONASE) 50 MCG/ACT nasal spray Place 1 spray into both nostrils at bedtime as needed for allergies.     [provider]  hydrOXYzine (VISTARIL) 100 MG capsule Take 100 mg by mouth daily as needed for anxiety or itching. 11/23/20   [provider]  isosorbide mononitrate (IMDUR) 60 MG 24 hr tablet Take 1 tablet (60  mg total) by mouth in the morning and at bedtime. 10/20/20 03/12/21  Strader, Fransisco Hertz, PA-C  levothyroxine (SYNTHROID) 25 MCG tablet Take 1 tablet (25 mcg total) by mouth daily. 02/02/21   Brita Romp, NP  lidocaine-prilocaine (EMLA) cream Apply 1 application topically every Monday, Wednesday, and Friday. Prior to dialysis    [provider]  loperamide (IMODIUM A-D) 2 MG tablet Take 2 tablets (4 mg total) by mouth daily as needed for diarrhea or loose stools. 12/17/20   Thurnell Lose, MD  loratadine (CLARITIN) 10 MG tablet Take 1 tablet (10 mg total) by mouth daily as needed for allergies. Patient not taking: No sig reported 07/14/18   Hosie Poisson, MD  metoprolol tartrate (LOPRESSOR) 25 MG tablet Take 1 tablet (25 mg total) by mouth 2 (two) times daily. Patient not taking: No sig reported 12/17/20   Thurnell Lose, MD  midodrine (PROAMATINE) 5 MG tablet Take Monday, Wednesday, Friday morning BEFORE dialysis 12/22/20   Arnoldo Lenis, MD  multivitamin (RENA-VIT) TABS tablet Take 1 tablet by mouth daily.    [provider]  nitroGLYCERIN (NITROSTAT) 0.4 MG SL tablet Place 1 tablet (0.4 mg total) under the tongue every 5 (five) minutes x 3 doses as needed for chest pain (if no relief after 2nd dose, proceed to the ED for an evaluation or call 911). 02/19/21   Verta Ellen., NP  omeprazole (PRILOSEC) 20 MG capsule Take 1 capsule (20 mg total) by mouth daily. 12/01/20  Rogene Houston, MD  sevelamer carbonate (RENVELA) 800 MG tablet Take 2 tablets (1,600 mg total) by mouth 3 (three) times daily with meals. 06/20/20   Meccariello, Bernita Raisin, DO  simvastatin (ZOCOR) 20 MG tablet Take 20 mg by mouth daily.    [provider]   Physical Exam: Vitals:   03/15/21 0245 03/15/21 0415 03/15/21 0525 03/15/21 0527  BP:    (!) 160/48  Pulse: 80 76 73 72  Resp: (!) 22 18 (!) 24 (!) 23  Temp:      TempSrc:      SpO2: 96% 92% 92% 93%  Weight:      Height:        Constitutional: NAD, calm, comfortable Eyes: PERRL, lids and conjunctivae normal ENMT: Mucous membranes are moist. Posterior pharynx clear of any exudate or lesions. Neck: Normal, supple, no masses, no thyromegaly Respiratory: clear to auscultation bilaterally, no wheezing, no crackles. Normal respiratory effort. No accessory muscle use.  Cardiovascular: Regular rate and rhythm, no murmurs / rubs / gallops.  1+ bilateral lower extremity pitting edema.  LUE AV graft with good thrill.  2+ pedal pulses. No carotid bruits.  Abdomen: No distention.  Bowel sounds positive.  Soft, no tenderness, no masses palpated. No hepatosplenomegaly.  Musculoskeletal: Mild generalized weakness.  Mild left knee swelling with significantly decreased ROM of left knee joint and TTP along lateral tibial condyle.  No clubbing / cyanosis.  No contractures. Normal muscle tone.  Skin: no rashes, lesions, ulcers on very limited dermatological examination. Neurologic: CN 2-12 grossly intact. Sensation intact, DTR normal. Strength 5/5 in all 4.  Psychiatric: Normal judgment and insight. Alert and oriented x 3. Normal mood.   Labs on Admission: I have personally reviewed following labs and imaging studies  CBC: Recent Labs  Lab 03/12/21 0413 03/15/21 0129  WBC 7.4 8.1  HGB 8.9* 9.5*  HCT 28.5* 31.4*  MCV 111.3* 115.0*  PLT 313 245    Basic Metabolic Panel: Recent Labs  Lab 03/12/21 0413 03/15/21 0129  NA 141 136  K 4.1 4.1  CL 100 98  CO2 29 27  GLUCOSE 111* 104*  BUN 26* 37*  CREATININE 5.97* 7.76*  CALCIUM 9.3 8.9    GFR: Estimated Creatinine Clearance: 4.3 mL/min (A) (by C-G formula based on SCr of 7.76 mg/dL (H)).  Liver Function Tests: No results for input(s): AST, ALT, ALKPHOS, BILITOT, PROT, ALBUMIN in the last 168 hours.  Radiological Exams on Admission: DG Chest Port 1 View  Result Date: 03/15/2021 CLINICAL DATA:  Chest pain EXAM: PORTABLE CHEST 1 VIEW COMPARISON:  03/12/2021 FINDINGS:  Cardiomegaly with vascular congestion. Prior CABG. Bibasilar airspace opacities with small effusions, similar to prior study. No overt edema. No acute bony abnormality. IMPRESSION: Cardiomegaly, vascular congestion. Bibasilar atelectasis or infiltrates with small effusions. Electronically Signed   By: Rolm Baptise M.D.   On: 03/15/2021 01:28   DG Knee Complete 4 Views Left  Result Date: 03/15/2021 CLINICAL DATA:  Left leg pain EXAM: LEFT KNEE - COMPLETE 4+ VIEW COMPARISON:  None. FINDINGS: No acute bony abnormality. Specifically, no fracture, subluxation, or dislocation. No joint effusion. Joint spaces maintained. Vascular calcifications noted. IMPRESSION: No acute bony abnormality. Electronically Signed   By: Rolm Baptise M.D.   On: 03/15/2021 01:27    EKG: Independently reviewed.  Vent. rate 83 BPM PR interval 163 ms QRS duration 132 ms QT/QTcB 440/518 ms P-R-T axes 121 194 63 Right and left arm electrode reversal, interpretation assumes no reversal Sinus  rhythm Nonspecific intraventricular conduction delay Lateral infarct, old Anteroseptal infarct, Minimal ST elevation, inferior leads  Assessment/Plan Principal Problem:   Chest pain In the setting of   Coronary artery disease Observation/telemetry. Continue nitroglycerin ointment patch. Continue aspirin 81 mg p.o. daily. Continue atorvastatin 10 mg p.o. daily. Resume metoprolol 12.5 mg p.o. twice daily. Check echocardiogram. Patient to follow-up earlier with cardiology.  Active Problems:   Elevated troponin Mildly elevated in the setting of anuric ESRD.    Chronic diastolic CHF (congestive heart failure) (HCC) No clinical signs of decompensation.    Paroxysmal atrial fibrillation (HCC) Not on anticoagulation due to GI bleed risk. Resume metoprolol at a lower dose.     Essential hypertension, benign Resume metoprolol at a lower dose. Monitor BP and heart rate.    ESRD on hemodialysis [facility]) Consult nephrology later  this morning for HD.    Anemia in chronic kidney disease Monitor hematocrit and hemoglobin. Erythropoietin as indicated per nephrology.    Left knee pain Diclofenac gel as needed. Acetaminophen as needed. Low-dose opioid if pain is too intense. Outpatient left need MRI if no improvement.    GERD (gastroesophageal reflux disease) Continue PPI.    HLD (hyperlipidemia) On atorvastatin.   Diabetes mellitus type 2 in nonobese (HCC) Carbohydrate modified diet. CBG monitoring with RI SS.    Hypothyroidism (acquired) Continue levothyroxine 25 mcg p.o. daily.      DVT prophylaxis: Lovenox SQ. Code Status:   Full code. Family Communication:  Her husband was pressing in the ED. Disposition Plan:   Patient is from:  Home.  Anticipated DC to:  Home.  Anticipated DC date:  03/15/2021 or 03/16/2021.  Anticipated DC barriers: Clinical status/pending echo.  Consults called:   Admission status:  Observation/telemetry.   Severity of Illness: High severity after presenting with ACS with mildly elevated troponin with more intense chest pain that required more SL NTG and NTG patch than usual.  Reubin Milan MD [facility]  How to contact the [facility] Attending or Consulting provider [place] or covering provider during after hours [place], for this patient?   Check the care team in [facility] and look for a) attending/consulting [facility] provider listed and b) the [facility] team listed Log into [email or web address] and use [place]'s universal password to access. If you do not have the password, please contact the hospital operator. Locate the [facility] provider you are looking for under [facility] and page to a number that you can be directly reached. If you still have difficulty reaching the provider, please page the [facility] (Director on Call) for the Hospitalists listed on amion for assistance.  03/15/2021, 5:59 AM   This document was prepared using Dragon voice recognition software and may contain some  unintended transcription errors.

## 2021-03-15 NOTE — Plan of Care (Signed)
  Problem: Education: Goal: Knowledge of General Education information will improve Description: Including pain rating scale, medication(s)/side effects and non-pharmacologic comfort measures Outcome: Progressing   Problem: Activity: Goal: Risk for activity intolerance will decrease Outcome: Progressing   

## 2021-03-15 NOTE — Progress Notes (Signed)
Patient seen and examined. Admitted after midnight secondary to Chest Pain. Patient chest pain description was typical and relieved by the use of NTG; mild elevation of troponin appreciated, no acute ischemic changes seen on EKG. Patient is hemodynamically stable currently. Please refer to H&P written by Dr. Olevia Bowens on 03/15/21 for further info/details on admission.  Plan: -cycle EKG -follow Echo results and cardiology recommendations -nephrology has been consulted for HD resumption (she is normally on dialysis M,W and F) -follow clinical response.  Barton Dubois MD [phone]

## 2021-03-15 NOTE — ED Notes (Signed)
Phlebotomy at bedside.

## 2021-03-15 NOTE — ED Notes (Signed)
Attempted IV access. Unable to obtain. Dr Betsey Holiday informed

## 2021-03-15 NOTE — Consult Note (Signed)
Reason for Consult: To manage dialysis and dialysis related needs Referring Physician: [place]  FARRIS [name] is an 80 y.o. female with HTN, gout, GI issues including colon AVM/GERD/recurrent GIB, as well as CAD s/p CABG and Afib, anxiety and ESRD- MWF [facility].  Multiple hospitalizations and ER visits the last 6 weeks.  She presented to the ER last night with c/o CP-  seeming like her usual angina but not improved with NTG like usual.  Her troponin was 54-  then trended up to 66-  plan is for echo and cards consult.  She would be due for HD today -  actually had dialysis here last on Friday.  Right now she feels OK-  no chest pain-  BUT has had left knee pain after a fall    Dialyzes at [facility] - MWF-  3.5 hours ( I cannot get throught to them today-  orders from last time as follows)  EDW 55. HD Bath 2k/2.5 ca, Dialyzer unknown, Heparin none. Access AVG BFR 300, DFR 500- 15 guage.  Past Medical History:  Diagnosis Date   Acute on chronic respiratory failure with hypoxia ([place]) 10/10/2016   Anxiety    Arthritis    AVM (arteriovenous malformation) of colon    CAD (coronary artery disease)    a. s/p CABG in 2013 b. DES to D1 in 10/2016. c. cath in 07/2018 showing patent grafts with occlusion of D1 at prior stent site and progression of PDA disease --> medical management recommended   Carotid artery disease ([place])    a. 68-11% LICA, 11/7260    Chronic anemia    Chronic bronchitis (HCC)    Chronic diastolic CHF (congestive heart failure) ([place])    a. 02/2012 Echo EF 60-65%, nl wall motion, Gr 1 DD, mod MR   Colon cancer ([place]) 1992   Esophageal stricture    ESRD on hemodialysis ([place])    ESRD due to HTN, started dialysis 2011 and gets HD at [facility] with Dr Hinda Lenis on MWF schedule.  Access is LUA AVF as of Sept 2014.    GERD (gastroesophageal reflux disease)    High cholesterol 12/2011   History of blood transfusion 07/2011; 12/2011; 01/2012 X 2; 04/2012   History of gout    History of  lower GI bleeding    Hypertension    Iron deficiency anemia    Jugular vein occlusion, right (HCC)    Mitral regurgitation    a. Moderate by echo, 02/2012   Myocardial infarction [facility])    NSVT (nonsustained ventricular tachycardia) ([place])    Ovarian cancer ([place]) 1992   PAF (paroxysmal atrial fibrillation) ([place])    Pneumonia ~ 2009   PUD (peptic ulcer disease)    TIA (transient ischemic attack)     Past Surgical History:  Procedure Laterality Date   A/V SHUNTOGRAM Left 03/19/2019   Procedure: A/V SHUNTOGRAM;  Surgeon: Katha Cabal, MD;  Location: [place] CV LAB;  Service: Cardiovascular;  Laterality: Left;   ABDOMINAL HYSTERECTOMY  1992   APPENDECTOMY  06/1990   AV FISTULA PLACEMENT  07/2009   left upper arm   AV FISTULA PLACEMENT Right 09/06/2016   Procedure: RIGHT FOREARM ARTERIOVENOUS (AV) GRAFT;  Surgeon: Elam Dutch, MD;  Location: [place];  Service: Vascular;  Laterality: Right;   AV FISTULA PLACEMENT N/A 02/24/2017   Procedure: INSERTION OF ARTERIOVENOUS (AV) GORE-TEX GRAFT ARM (BRACHIAL AXILLARY);  Surgeon: Katha Cabal, MD;  Location: [facility] ORS;  Service: Vascular;  Laterality: N/A;   [place] Right 09/06/2016   Procedure: REMOVAL OF Right Arm ARTERIOVENOUS GORETEX GRAFT and Vein Patch angioplasty of brachial artery;  Surgeon: Angelia Mould, MD;  Location: [place];  Service: Vascular;  Laterality: Right;   BIOPSY  09/26/2019   Procedure: BIOPSY;  Surgeon: Rogene Houston, MD;  [facility];  Service: Endoscopy;;   COLON RESECTION  1992   COLON SURGERY     COLONOSCOPY N/A 03/09/2019   Procedure: COLONOSCOPY;  Surgeon: Rogene Houston, MD;  [facility];  Service: Endoscopy;  Laterality: N/A;   CORONARY ANGIOPLASTY WITH STENT PLACEMENT  12/15/11   "2"   CORONARY ANGIOPLASTY WITH STENT PLACEMENT  y/2013   "1; makes total of 3" (05/02/2012)   CORONARY ARTERY BYPASS GRAFT  06/13/2012   Procedure: CORONARY ARTERY BYPASS GRAFTING  (CABG);  Surgeon: Grace Isaac, MD;  Location: [place];  Service: Open Heart Surgery;  Laterality: N/A;  cabg x four;  using left internal mammary artery, and left leg greater saphenous vein harvested endoscopically   CORONARY STENT INTERVENTION N/A 10/13/2016   Procedure: Coronary Stent Intervention;  Surgeon: Troy Sine, MD;  Location: [place] CV LAB;  Service: Cardiovascular;  Laterality: N/A;   DIALYSIS/PERMA CATHETER REMOVAL N/A 04/18/2017   Procedure: DIALYSIS/PERMA CATHETER REMOVAL;  Surgeon: Katha Cabal, MD;  Location: [place] CV LAB;  Service: Cardiovascular;  Laterality: N/A;   DILATION AND CURETTAGE OF UTERUS     ESOPHAGOGASTRODUODENOSCOPY  01/20/2012   Procedure: ESOPHAGOGASTRODUODENOSCOPY (EGD);  Surgeon: Ladene Artist, MD,FACG;  Location: [facility] ENDOSCOPY;  Service: Endoscopy;  Laterality: N/A;   ESOPHAGOGASTRODUODENOSCOPY N/A 03/26/2013   Procedure: ESOPHAGOGASTRODUODENOSCOPY (EGD);  Surgeon: Irene Shipper, MD;  Location: [facility] ENDOSCOPY;  Service: Endoscopy;  Laterality: N/A;   ESOPHAGOGASTRODUODENOSCOPY N/A 04/30/2015   Procedure: ESOPHAGOGASTRODUODENOSCOPY (EGD);  Surgeon: Rogene Houston, MD;  [facility];  Service: Endoscopy;  Laterality: N/A;  1pm - moved to 10/20 @ 1:10   ESOPHAGOGASTRODUODENOSCOPY N/A 07/29/2016   Procedure: ESOPHAGOGASTRODUODENOSCOPY (EGD);  Surgeon: Manus Gunning, MD;  Location: [place];  Service: Gastroenterology;  Laterality: N/A;  enteroscopy   ESOPHAGOGASTRODUODENOSCOPY N/A 09/26/2019   Procedure: ESOPHAGOGASTRODUODENOSCOPY (EGD);  Surgeon: Rogene Houston, MD;  [facility];  Service: Endoscopy;  Laterality: N/A;  1250   ESOPHAGOGASTRODUODENOSCOPY (EGD) WITH PROPOFOL N/A 02/05/2021   Procedure: ESOPHAGOGASTRODUODENOSCOPY (EGD) WITH PROPOFOL;  Surgeon: Eloise Harman, DO;  [facility];  Service: Endoscopy;  Laterality: N/A;   GIVENS CAPSULE STUDY N/A 03/07/2019   Procedure: GIVENS CAPSULE  STUDY;  Surgeon: Rogene Houston, MD;  [facility];  Service: Endoscopy;  Laterality: N/A;  7:30   INSERTION OF DIALYSIS CATHETER N/A 10/05/2020   Procedure: ABORTED TUNNELED DIALYSIS CATHETER PLACEMENT RIGHT INTERNAL JUGULAR VEIN ;  Surgeon: Virl Cagey, MD;  Location: [facility];  Service: General;  Laterality: N/A;   INTRAOPERATIVE TRANSESOPHAGEAL ECHOCARDIOGRAM  06/13/2012   Procedure: INTRAOPERATIVE TRANSESOPHAGEAL ECHOCARDIOGRAM;  Surgeon: Grace Isaac, MD;  Location: [place];  Service: Open Heart Surgery;  Laterality: N/A;   IR DIALY SHUNT INTRO NEEDLE/INTRACATH INITIAL W/IMG LEFT Left 10/06/2020   IR FLUORO GUIDE CV LINE RIGHT  06/17/2020   IR GENERIC HISTORICAL  07/26/2016   IR FLUORO GUIDE CV LINE RIGHT 07/26/2016 Greggory Keen, MD [facility]   IR GENERIC HISTORICAL  07/26/2016   IR US GUIDE VASC ACCESS RIGHT 07/26/2016 Greggory Keen, MD [facility]   IR [facility]  HISTORICAL  08/02/2016   IR US GUIDE VASC ACCESS RIGHT 08/02/2016 Greggory Keen, MD [facility]   IR GENERIC HISTORICAL  08/02/2016   IR FLUORO GUIDE CV LINE RIGHT 08/02/2016 Greggory Keen, MD [facility]   IR RADIOLOGY PERIPHERAL GUIDED IV START  03/28/2017   IR REMOVAL TUN CV CATH W/O FL  08/11/2020   IR THROMBECTOMY AV FISTULA W/THROMBOLYSIS INC/SHUNT/IMG LEFT Left 06/17/2020   IR US GUIDE VASC ACCESS LEFT  06/17/2020   IR US GUIDE VASC ACCESS RIGHT  03/28/2017   IR US GUIDE VASC ACCESS RIGHT  06/17/2020   LEFT HEART CATH AND CORONARY ANGIOGRAPHY N/A 09/20/2016   Procedure: Left Heart Cath and Coronary Angiography;  Surgeon: [name], MD;  Location: [place] CV LAB;  Service: Cardiovascular;  Laterality: N/A;   LEFT HEART CATH AND CORS/GRAFTS ANGIOGRAPHY N/A 10/13/2016   Procedure: Left Heart Cath and Cors/Grafts Angiography;  Surgeon: Troy Sine, MD;  Location: [place] CV LAB;  Service: Cardiovascular;  Laterality: N/A;   LEFT HEART CATH AND CORS/GRAFTS ANGIOGRAPHY N/A 07/13/2018   Procedure: LEFT  HEART CATH AND CORS/GRAFTS ANGIOGRAPHY;  Surgeon: Martinique, Peter M, MD;  Location: [place] CV LAB;  Service: Cardiovascular;  Laterality: N/A;   LEFT HEART CATHETERIZATION WITH CORONARY ANGIOGRAM N/A 12/15/2011   Procedure: LEFT HEART CATHETERIZATION WITH CORONARY ANGIOGRAM;  Surgeon: Burnell Blanks, MD;  Location: [facility] CATH LAB;  Service: Cardiovascular;  Laterality: N/A;   LEFT HEART CATHETERIZATION WITH CORONARY ANGIOGRAM N/A 01/10/2012   Procedure: LEFT HEART CATHETERIZATION WITH CORONARY ANGIOGRAM;  Surgeon: Peter M Martinique, MD;  Location: [facility] CATH LAB;  Service: Cardiovascular;  Laterality: N/A;   LEFT HEART CATHETERIZATION WITH CORONARY ANGIOGRAM N/A 06/08/2012   Procedure: LEFT HEART CATHETERIZATION WITH CORONARY ANGIOGRAM;  Surgeon: Burnell Blanks, MD;  Location: [facility] CATH LAB;  Service: Cardiovascular;  Laterality: N/A;   LEFT HEART CATHETERIZATION WITH CORONARY/GRAFT ANGIOGRAM N/A 12/10/2013   Procedure: LEFT HEART CATHETERIZATION WITH Beatrix Fetters;  Surgeon: [name], MD;  Location: [facility] CATH LAB;  Service: Cardiovascular;  Laterality: N/A;   OVARY SURGERY     ovarian cancer   POLYPECTOMY  03/09/2019   Procedure: POLYPECTOMY;  Surgeon: Rogene Houston, MD;  [facility];  Service: Endoscopy;;  cecal    POLYPECTOMY N/A 09/26/2019   Procedure: DUODENAL POLYPECTOMY;  Surgeon: Rogene Houston, MD;  [facility];  Service: Endoscopy;  Laterality: N/A;   REVISION OF ARTERIOVENOUS GORETEX GRAFT N/A 02/24/2017   Procedure: REVISION OF ARTERIOVENOUS GORETEX GRAFT (RESECTION);  Surgeon: Katha Cabal, MD;  Location: [facility] ORS;  Service: Vascular;  Laterality: N/A;   REVISON OF ARTERIOVENOUS FISTULA Left 06/19/2020   Procedure: REVISION OF LEFT UPPER ARM AV GRAFT WITH INTERPOSITION JUMP GRAFT USING 6MM GORE LIMB;  Surgeon: Marty Heck, MD;  Location: [place];  Service: Vascular;  Laterality: Left;   SHUNTOGRAM N/A 10/15/2013   Procedure:  Fistulogram;  Surgeon: Serafina Mitchell, MD;  Location: [facility] CATH LAB;  Service: Cardiovascular;  Laterality: N/A;   THROMBECTOMY / ARTERIOVENOUS GRAFT REVISION  2011   left upper arm   TUBAL LIGATION  1980's   UPPER EXTREMITY ANGIOGRAPHY Bilateral 12/06/2016   Procedure: Upper Extremity Angiography;  Surgeon: Katha Cabal, MD;  Location: [place] CV LAB;  Service: Cardiovascular;  Laterality: Bilateral;   UPPER EXTREMITY INTERVENTION Left 06/06/2017   Procedure: UPPER EXTREMITY INTERVENTION;  Surgeon: Katha Cabal, MD;  Location: [place] CV LAB;  Service:  Cardiovascular;  Laterality: Left;    Family History  Problem Relation Age of Onset   Heart disease Mother        Heart Disease before age 10   Hyperlipidemia Mother    Hypertension Mother    Diabetes Mother    Heart attack Mother    Heart disease Father        Heart Disease before age 19   Hyperlipidemia Father    Hypertension Father    Diabetes Father    Diabetes Sister    Hypertension Sister    Diabetes Brother    Hyperlipidemia Brother    Heart attack Brother    Hypertension Sister    Heart attack Brother    Colon cancer Child 67   Other Other        noncontributory for early CAD   Esophageal cancer Neg Hx    Liver disease Neg Hx    Kidney disease Neg Hx    Colon polyps Neg Hx     Social History:  reports that she has never smoked. She has never used smokeless tobacco. She reports that she does not drink alcohol and does not use drugs.  Allergies:  Allergies  Allergen Reactions   Aspirin Other (See Comments)    High Doses Mess up her stomach; "makes my bowels have blood in them". Takes 81 mg EC Aspirin    Penicillins Other (See Comments)    SYNCOPE? , "makes me real weak when I take it; like I'll pass out"  Has patient had a PCN reaction causing immediate rash, facial/tongue/throat swelling, SOB or lightheadedness with hypotension: Yes Has patient had a PCN reaction causing severe rash  involving mucus membranes or skin necrosis: no Has patient had a PCN reaction that required hospitalization no Has patient had a PCN reaction occurring within the last 10 years: no If all of the above   Amlodipine Swelling   Bactrim [Sulfamethoxazole-Trimethoprim] Rash   Contrast Media [Iodinated Diagnostic Agents] Itching   Iron Itching and Other (See Comments)    "they gave me iron in dialysis; had to give me Benadryl cause I had to have the iron" (05/02/2012)   Nitrofurantoin Hives   Tylenol [Acetaminophen] Itching and Other (See Comments)    Makes her feet on fire per pt   Gabapentin Other (See Comments)    Unknown reaction   Hydralazine Itching    Has tolerated while inpatient   Levofloxacin    No Known Allergies    Ranexa [Ranolazine] Other (See Comments)    Myoclonus-hospitalized    Dexilant [Dexlansoprazole] Other (See Comments)    Upset stomach   Levaquin [Levofloxacin In D5w] Rash   Morphine And Related Itching and Other (See Comments)    Itching in feet   Plavix [Clopidogrel Bisulfate] Rash   Protonix [Pantoprazole Sodium] Rash   Venofer [Ferric Oxide] Itching and Other (See Comments)    Patient reports using Benadryl prior to doses as [facility]    Medications: I have reviewed the patient's current medications.  Results for orders placed or performed during the hospital encounter of 03/15/21 (from the past 48 hour(s))  Resp Panel by RT-PCR (Flu A&B, Covid) Nasopharyngeal Swab     Status: None   Collection Time: 03/15/21 12:32 AM   Specimen: Nasopharyngeal Swab; Nasopharyngeal(NP) swabs in vial transport medium  Result Value Ref Range   SARS Coronavirus 2 by RT PCR NEGATIVE NEGATIVE    Comment: (NOTE) SARS-CoV-2 target nucleic acids are NOT DETECTED.  The  SARS-CoV-2 RNA is generally detectable in upper respiratory specimens during the acute phase of infection. The lowest concentration of SARS-CoV-2 viral copies this assay can detect is 138 copies/mL. A  negative result does not preclude SARS-Cov-2 infection and should not be used as the sole basis for treatment or other patient management decisions. A negative result may occur with  improper specimen collection/handling, submission of specimen other than nasopharyngeal swab, presence of viral mutation(s) within the areas targeted by this assay, and inadequate number of viral copies(<138 copies/mL). A negative result must be combined with clinical observations, patient history, and epidemiological information. The expected result is Negative.  Fact Sheet for Patients:  EntrepreneurPulse.com.au  Fact Sheet for Healthcare Providers:  IncredibleEmployment.be  This test is no t yet approved or cleared by the [place] FDA and  has been authorized for detection and/or diagnosis of SARS-CoV-2 by FDA under an Emergency Use Authorization (EUA). This EUA will remain  in effect (meaning this test can be used) for the duration of the COVID-19 declaration under Section 564(b)(1) of the Act, 21 U.S.C.section 360bbb-3(b)(1), unless the authorization is terminated  or revoked sooner.       Influenza A by PCR NEGATIVE NEGATIVE   Influenza B by PCR NEGATIVE NEGATIVE    Comment: (NOTE) The Xpert Xpress SARS-CoV-2/FLU/RSV plus assay is intended as an aid in the diagnosis of influenza from Nasopharyngeal swab specimens and should not be used as a sole basis for treatment. Nasal washings and aspirates are unacceptable for Xpert Xpress SARS-CoV-2/FLU/RSV testing.  Fact Sheet for Patients: EntrepreneurPulse.com.au  Fact Sheet for Healthcare Providers: IncredibleEmployment.be  This test is not yet approved or cleared by the [place] FDA and has been authorized for detection and/or diagnosis of SARS-CoV-2 by FDA under an Emergency Use Authorization (EUA). This EUA will remain in effect (meaning this test can be used)  for the duration of the COVID-19 declaration under Section 564(b)(1) of the Act, 21 U.S.C. section 360bbb-3(b)(1), unless the authorization is terminated or revoked.  Performed at [facility], [address] [place], [place] [address]   Basic metabolic panel     Status: Abnormal   Collection Time: 03/15/21  1:29 AM  Result Value Ref Range   Sodium 136 135 - 145 mmol/L   Potassium 4.1 3.5 - 5.1 mmol/L   Chloride 98 98 - 111 mmol/L   CO2 27 22 - 32 mmol/L   Glucose, Bld 104 (H) 70 - 99 mg/dL    Comment: Glucose reference range applies only to samples taken after fasting for at least 8 hours.   BUN 37 (H) 8 - 23 mg/dL   Creatinine, Ser 7.76 (H) 0.44 - 1.00 mg/dL   Calcium 8.9 8.9 - 10.3 mg/dL   GFR, Estimated 5 (L) >60 mL/min    Comment: (NOTE) Calculated using the CKD-EPI Creatinine Equation (2021)    Anion gap 11 5 - 15    Comment: Performed at [facility], [address], [place], [place] [address]  CBC     Status: Abnormal   Collection Time: 03/15/21  1:29 AM  Result Value Ref Range   WBC 8.1 4.0 - 10.5 K/uL   RBC 2.73 (L) 3.87 - 5.11 MIL/uL   Hemoglobin 9.5 (L) 12.0 - 15.0 g/dL   HCT 31.4 (L) 36.0 - 46.0 %   MCV 115.0 (H) 80.0 - 100.0 fL   MCH 34.8 (H) 26.0 - 34.0 pg   MCHC 30.3 30.0 - 36.0 g/dL   RDW 21.2 (H) 11.5 -  15.5 %   Platelets 293 150 - 400 K/uL   nRBC 0.0 0.0 - 0.2 %    Comment: Performed at [facility], [address], [place], [place] [address]  Troponin I (High Sensitivity)     Status: Abnormal   Collection Time: 03/15/21  1:29 AM  Result Value Ref Range   Troponin I (High Sensitivity) 54 (H) <18 ng/L    Comment: (NOTE) Elevated high sensitivity troponin I (hsTnI) values and significant  changes across serial measurements may suggest ACS but many other  chronic and acute conditions are known to elevate hsTnI results.  Refer to the "Links" section for chest pain algorithms and additional  guidance. Performed at [facility], [address],  [place], [place] [address]   Troponin I (High Sensitivity)     Status: Abnormal   Collection Time: 03/15/21  4:16 AM  Result Value Ref Range   Troponin I (High Sensitivity) 66 (H) <18 ng/L    Comment: (NOTE) Elevated high sensitivity troponin I (hsTnI) values and significant  changes across serial measurements may suggest ACS but many other  chronic and acute conditions are known to elevate hsTnI results.  Refer to the "Links" section for chest pain algorithms and additional  guidance. Performed at [facility], [address] [place], [place] [address]    *Note: Due to a large number of results and/or encounters for the requested time period, some results have not been displayed. A complete set of results can be found in Results Review.    DG Chest Port 1 View  Result Date: 03/15/2021 CLINICAL DATA:  Chest pain EXAM: PORTABLE CHEST 1 VIEW COMPARISON:  03/12/2021 FINDINGS: Cardiomegaly with vascular congestion. Prior CABG. Bibasilar airspace opacities with small effusions, similar to prior study. No overt edema. No acute bony abnormality. IMPRESSION: Cardiomegaly, vascular congestion. Bibasilar atelectasis or infiltrates with small effusions. Electronically Signed   By: Rolm Baptise M.D.   On: 03/15/2021 01:28   DG Knee Complete 4 Views Left  Result Date: 03/15/2021 CLINICAL DATA:  Left leg pain EXAM: LEFT KNEE - COMPLETE 4+ VIEW COMPARISON:  None. FINDINGS: No acute bony abnormality. Specifically, no fracture, subluxation, or dislocation. No joint effusion. Joint spaces maintained. Vascular calcifications noted. IMPRESSION: No acute bony abnormality. Electronically Signed   By: Rolm Baptise M.D.   On: 03/15/2021 01:27    ROS: positive for knee pain, no SOB or current CP Blood pressure (!) 172/55, pulse 78, temperature 98 F (36.7 C), temperature source Oral, resp. rate 16, height 5\' 1"  (1.549 m), weight 47.6 kg, SpO2 99 %. General appearance: alert, cooperative, and no distress Resp: clear to  auscultation bilaterally Cardio: regular rate and rhythm, S1, S2 normal, no murmur, click, rub or gallop GI: soft, non-tender; bowel sounds normal; no masses,  no organomegaly Extremities: edema trace-  tenderness to left knee Left upper arm AVG-  good thrill and bruit   Assessment/Plan: 80 year old BF with multiple medical issues and frequent hospitalizations now presenting with chest pain and positive troponin 1 CP-  more intense than usual CP-  took more NTG to resolve-  positive troponins-  echo ordered as well as cards consult 2 ESRD: usually MWF at [facility]-  will plan for HD here today  3 Hypertension: volume status seems just a little heavy-  BP if anything high-  usually at home on metoprolol as well as midodrine-  will hold midodrine and UF as able  4. Anemia of ESRD: hgb 9.5--  cont ESA with HD 5. Metabolic Bone Disease: appears to be on renvela as OP-  will order - no mention of vitamin D 6. Knee pain-  x- ray negative-  conservative management  Shawna [name] 03/15/2021, 9:17 AM

## 2021-03-15 NOTE — ED Provider Notes (Addendum)
[facility] EMERGENCY DEPARTMENT Provider Note   CSN: [id number] Arrival date & time: 03/15/21  0003     History Chief Complaint  Patient presents with   Chest Pain    Shawna [name] is a 80 y.o. female.  Patient presents to the emergency department for evaluation of chest pain.  Patient reports a history of heart disease.  She states that she does frequently get chest pain and needs to use a nitroglycerin approximately once a week.  Generally 1 nitroglycerin will take her pain away.  Tonight she had pain that improved with nitroglycerin but came back.  She had to do this 3 separate times before coming to the ER.  No associated shortness of breath.      Past Medical History:  Diagnosis Date   Acute on chronic respiratory failure with hypoxia ([place]) 10/10/2016   Anxiety    Arthritis    AVM (arteriovenous malformation) of colon    CAD (coronary artery disease)    a. s/p CABG in 2013 b. DES to D1 in 10/2016. c. cath in 07/2018 showing patent grafts with occlusion of D1 at prior stent site and progression of PDA disease --> medical management recommended   Carotid artery disease ([place])    a. 14-48% LICA, 07/8561    Chronic anemia    Chronic bronchitis (HCC)    Chronic diastolic CHF (congestive heart failure) ([place])    a. 02/2012 Echo EF 60-65%, nl wall motion, Gr 1 DD, mod MR   Colon cancer ([place]) 1992   Esophageal stricture    ESRD on hemodialysis ([place])    ESRD due to HTN, started dialysis 2011 and gets HD at [facility] with Dr Hinda Lenis on MWF schedule.  Access is LUA AVF as of Sept 2014.    GERD (gastroesophageal reflux disease)    High cholesterol 12/2011   History of blood transfusion 07/2011; 12/2011; 01/2012 X 2; 04/2012   History of gout    History of lower GI bleeding    Hypertension    Iron deficiency anemia    Mitral regurgitation    a. Moderate by echo, 02/2012   Myocardial infarction [facility])    NSVT (nonsustained ventricular tachycardia) ([place])    Ovarian cancer ([place]) 1992    PAF (paroxysmal atrial fibrillation) ([place])    Pneumonia ~ 2009   PUD (peptic ulcer disease)    TIA (transient ischemic attack)     Patient Active Problem List   Diagnosis Date Noted   Symptomatic anemia 02/27/2021   Moderate protein-calorie malnutrition ([place]) 02/27/2021   Hypokalemia 02/27/2021   COVID-19 virus infection 02/03/2021   Leukocytosis 02/03/2021   Elevated MCV 02/03/2021   Hypoalbuminemia due to protein-calorie malnutrition ([place]) 02/03/2021   Hypothyroidism (acquired) 02/03/2021   Myositis 12/03/2020   Ataxia 12/02/2020   Diabetes mellitus type 2 in nonobese ([place]) 11/10/2020   Acute pulmonary edema ([place]) 11/10/2020   Generalized weakness 11/09/2020   Dialysis AV fistula malfunction, initial encounter ([place])    Jugular vein occlusion, right (HCC)    Failure of surgically constructed arteriovenous fistula ([place]) 10/03/2020   Myoclonus 08/31/2020   Clotted renal dialysis AV graft, initial encounter ([place])    Hypertensive heart and chronic kidney disease with heart failure and stage 1 through stage 4 chronic kidney disease, or chronic kidney disease ([place])    Hemodialysis-associated hypotension    Acute hypoxemic respiratory failure ([place]) 06/14/2020   Hypertension 06/12/2020   Irritable bowel syndrome 02/25/2020   Adenomatous duodenal polyp 09/10/2019  History of GI bleed 09/10/2019   Angina pectoris ([place]) 06/05/2019   Chest pain 06/03/2019   Small intestinal bacterial overgrowth 05/14/2019   Iron deficiency anemia 04/02/2019   GI bleed 03/08/2019   Gastrointestinal hemorrhage with melena 03/06/2019   Acute respiratory failure with hypoxia (HCC) 12/25/2018   Elevated troponin 12/14/2018   Chest pain at rest 07/13/2018   Hand steal syndrome ([place]) 08/01/2017   Anemia 07/14/2017   Coronary artery disease 06/05/2017   Mesenteric ischemia (HCC)    Diverticulitis    Enteritis    Complication of vascular access for dialysis 03/19/2017   Preoperative clearance  01/25/2017   H/O non-ST elevation myocardial infarction (NSTEMI) 10/24/2016   Fluid overload 89/21/1941   Complication from renal dialysis device 10/10/2016   Non-ST elevation MI (NSTEMI) ([place])    Encounter for fitting and adjustment of vascular catheter    Heme positive stool    Demand ischemia ([place]) 07/27/2016   Hypertensive emergency 07/08/2016   Acute on chronic respiratory failure with hypoxia [facility])    Cardiac arrest [facility])    Palliative care encounter    Goals of care, counseling/discussion    Hypertensive crisis without congestive heart failure 05/09/2016   Flash pulmonary edema ([place]) 04/06/2016   Acute respiratory failure ([place]) 04/06/2016   Hypertensive crisis 01/27/2016   History of colon cancer 01/27/2016   History of ovarian cancer 01/27/2016   Hypertensive urgency 01/27/2016   Paroxysmal atrial fibrillation ([place]) 10/14/2015   Coronary angioplasty status 10/14/2015   Malignant neoplasm of right ovary ([place]) 10/14/2015   Narrow complex tachycardia ([place]) 09/08/2015   SVT (supraventricular tachycardia) ([place]) 09/08/2015   Influenza A 08/30/2015   Acute on chronic diastolic CHF (congestive heart failure) ([place]) 05/04/2015   Unstable angina ([place]) 05/03/2015   DOE (dyspnea on exertion)    Essential hypertension    Hyponatremia 10/01/2014   Pain in joint, lower leg 08/14/2014   Dacryocystitis 05/29/2013   Chronic diastolic CHF (congestive heart failure) ([place]) 03/22/2013   GI bleeding 03/21/2013   Acute post-hemorrhagic anemia 03/21/2013   Occlusion and stenosis of carotid artery without mention of cerebral infarction 01/24/2013   Hx of CABG 07/05/2012   Carotid artery disease ([place]) 07/05/2012   Mitral regurgitation 06/12/2012   Pneumonia 06/09/2012   Non-STEMI (non-ST elevated myocardial infarction) ([place]) 06/08/2012   Ischemic chest pain ([place]) 03/01/2012   AVM (arteriovenous malformation) of small bowel, acquired 01/20/2012   GERD (gastroesophageal reflux disease)  01/09/2012   HLD (hyperlipidemia) 01/05/2012   Atherosclerotic heart disease of native coronary artery without angina pectoris 12/16/2011   Essential hypertension, benign 12/16/2011   ESRD on hemodialysis ([place]) 12/16/2011   Anxiety disorder 05/04/2011   Anemia in chronic kidney disease 04/29/2011   ESRD (end stage renal disease) on dialysis ([place]) 04/29/2011   Gout 04/29/2011   Hypertensive chronic kidney disease with stage 5 chronic kidney disease or end stage renal disease ([place]) 04/29/2011    Past Surgical History:  Procedure Laterality Date   A/V SHUNTOGRAM Left 03/19/2019   Procedure: A/V SHUNTOGRAM;  Surgeon: Katha Cabal, MD;  Location: [facility];  Service: Cardiovascular;  Laterality: Left;   ABDOMINAL HYSTERECTOMY  1992   APPENDECTOMY  06/1990   AV FISTULA PLACEMENT  07/2009   left upper arm   AV FISTULA PLACEMENT Right 09/06/2016   Procedure: RIGHT FOREARM ARTERIOVENOUS (AV) GRAFT;  Surgeon: Elam Dutch, MD;  Location: [place];  Service: Vascular;  Laterality: Right;   AV FISTULA PLACEMENT N/A 02/24/2017  Procedure: INSERTION OF ARTERIOVENOUS (AV) GORE-TEX GRAFT ARM (BRACHIAL AXILLARY);  Surgeon: Katha Cabal, MD;  Location: [facility] ORS;  Service: Vascular;  Laterality: N/A;   [place] Right 09/06/2016   Procedure: REMOVAL OF Right Arm ARTERIOVENOUS GORETEX GRAFT and Vein Patch angioplasty of brachial artery;  Surgeon: Angelia Mould, MD;  Location: [place];  Service: Vascular;  Laterality: Right;   BIOPSY  09/26/2019   Procedure: BIOPSY;  Surgeon: Rogene Houston, MD;  [facility];  Service: Endoscopy;;   COLON RESECTION  1992   COLON SURGERY     COLONOSCOPY N/A 03/09/2019   Procedure: COLONOSCOPY;  Surgeon: Rogene Houston, MD;  [facility];  Service: Endoscopy;  Laterality: N/A;   CORONARY ANGIOPLASTY WITH STENT PLACEMENT  12/15/11   "2"   CORONARY ANGIOPLASTY WITH STENT PLACEMENT  y/2013   "1; makes total of 3"  (05/02/2012)   CORONARY ARTERY BYPASS GRAFT  06/13/2012   Procedure: CORONARY ARTERY BYPASS GRAFTING (CABG);  Surgeon: Grace Isaac, MD;  Location: [place];  Service: Open Heart Surgery;  Laterality: N/A;  cabg x four;  using left internal mammary artery, and left leg greater saphenous vein harvested endoscopically   CORONARY STENT INTERVENTION N/A 10/13/2016   Procedure: Coronary Stent Intervention;  Surgeon: Troy Sine, MD;  Location: [place] CV LAB;  Service: Cardiovascular;  Laterality: N/A;   DIALYSIS/PERMA CATHETER REMOVAL N/A 04/18/2017   Procedure: DIALYSIS/PERMA CATHETER REMOVAL;  Surgeon: Katha Cabal, MD;  Location: [place] [facility];  Service: Cardiovascular;  Laterality: N/A;   DILATION AND CURETTAGE OF UTERUS     ESOPHAGOGASTRODUODENOSCOPY  01/20/2012   Procedure: ESOPHAGOGASTRODUODENOSCOPY (EGD);  Surgeon: Ladene Artist, MD,FACG;  Location: [facility]COPY;  Service: Endoscopy;  Laterality: N/A;   ESOPHAGOGASTRODUODENOSCOPY N/A 03/26/2013   Procedure: ESOPHAGOGASTRODUODENOSCOPY (EGD);  Surgeon: Irene Shipper, MD;  Location: [facility] ENDOSCOPY;  Service: Endoscopy;  Laterality: N/A;   ESOPHAGOGASTRODUODENOSCOPY N/A 04/30/2015   Procedure: ESOPHAGOGASTRODUODENOSCOPY (EGD);  Surgeon: Rogene Houston, MD;  [facility];  Service: Endoscopy;  Laterality: N/A;  1pm - moved to 10/20 @ 1:10   ESOPHAGOGASTRODUODENOSCOPY N/A 07/29/2016   Procedure: ESOPHAGOGASTRODUODENOSCOPY (EGD);  Surgeon: Manus Gunning, MD;  Location: [place];  Service: Gastroenterology;  Laterality: N/A;  enteroscopy   ESOPHAGOGASTRODUODENOSCOPY N/A 09/26/2019   Procedure: ESOPHAGOGASTRODUODENOSCOPY (EGD);  Surgeon: Rogene Houston, MD;  [facility];  Service: Endoscopy;  Laterality: N/A;  1250   ESOPHAGOGASTRODUODENOSCOPY (EGD) WITH PROPOFOL N/A 02/05/2021   Procedure: ESOPHAGOGASTRODUODENOSCOPY (EGD) WITH PROPOFOL;  Surgeon: Eloise Harman, DO;  [facility];   Service: Endoscopy;  Laterality: N/A;   GIVENS CAPSULE STUDY N/A 03/07/2019   Procedure: GIVENS CAPSULE STUDY;  Surgeon: Rogene Houston, MD;  [facility];  Service: Endoscopy;  Laterality: N/A;  7:30   INSERTION OF DIALYSIS CATHETER N/A 10/05/2020   Procedure: ABORTED TUNNELED DIALYSIS CATHETER PLACEMENT RIGHT INTERNAL JUGULAR VEIN ;  Surgeon: Virl Cagey, MD;  Location: [facility];  Service: General;  Laterality: N/A;   INTRAOPERATIVE TRANSESOPHAGEAL ECHOCARDIOGRAM  06/13/2012   Procedure: INTRAOPERATIVE TRANSESOPHAGEAL ECHOCARDIOGRAM;  Surgeon: Grace Isaac, MD;  Location: [place];  Service: Open Heart Surgery;  Laterality: N/A;   IR DIALY SHUNT INTRO NEEDLE/INTRACATH INITIAL W/IMG LEFT Left 10/06/2020   IR FLUORO GUIDE CV LINE RIGHT  06/17/2020   IR GENERIC HISTORICAL  07/26/2016   IR FLUORO GUIDE CV LINE RIGHT 07/26/2016 Greggory Keen, MD [facility]  IR GENERIC HISTORICAL  07/26/2016   IR US GUIDE VASC ACCESS RIGHT 07/26/2016 Greggory Keen, MD [facility]   IR GENERIC HISTORICAL  08/02/2016   IR US GUIDE VASC ACCESS RIGHT 08/02/2016 Greggory Keen, MD [facility]   IR GENERIC HISTORICAL  08/02/2016   IR FLUORO GUIDE CV LINE RIGHT 08/02/2016 Greggory Keen, MD [facility]   IR RADIOLOGY PERIPHERAL GUIDED IV START  03/28/2017   IR REMOVAL TUN CV CATH W/O FL  08/11/2020   IR THROMBECTOMY AV FISTULA W/THROMBOLYSIS INC/SHUNT/IMG LEFT Left 06/17/2020   IR US GUIDE VASC ACCESS LEFT  06/17/2020   IR US GUIDE VASC ACCESS RIGHT  03/28/2017   IR US GUIDE VASC ACCESS RIGHT  06/17/2020   LEFT HEART CATH AND CORONARY ANGIOGRAPHY N/A 09/20/2016   Procedure: Left Heart Cath and Coronary Angiography;  Surgeon: [name], MD;  Location: [place] [facility];  Service: Cardiovascular;  Laterality: N/A;   LEFT HEART CATH AND CORS/GRAFTS ANGIOGRAPHY N/A 10/13/2016   Procedure: Left Heart Cath and Cors/Grafts Angiography;  Surgeon: Troy Sine, MD;  Location: [place] [facility];  Service:  Cardiovascular;  Laterality: N/A;   LEFT HEART CATH AND CORS/GRAFTS ANGIOGRAPHY N/A 07/13/2018   Procedure: LEFT HEART CATH AND CORS/GRAFTS ANGIOGRAPHY;  Surgeon: Martinique, Peter M, MD;  Location: [place] [facility];  Service: Cardiovascular;  Laterality: N/A;   LEFT HEART CATHETERIZATION WITH CORONARY ANGIOGRAM N/A 12/15/2011   Procedure: LEFT HEART CATHETERIZATION WITH CORONARY ANGIOGRAM;  Surgeon: Burnell Blanks, MD;  Location: [facility] CATH LAB;  Service: Cardiovascular;  Laterality: N/A;   LEFT HEART CATHETERIZATION WITH CORONARY ANGIOGRAM N/A 01/10/2012   Procedure: LEFT HEART CATHETERIZATION WITH CORONARY ANGIOGRAM;  Surgeon: Peter M Martinique, MD;  Location: [facility] CATH LAB;  Service: Cardiovascular;  Laterality: N/A;   LEFT HEART CATHETERIZATION WITH CORONARY ANGIOGRAM N/A 06/08/2012   Procedure: LEFT HEART CATHETERIZATION WITH CORONARY ANGIOGRAM;  Surgeon: Burnell Blanks, MD;  Location: [facility] CATH LAB;  Service: Cardiovascular;  Laterality: N/A;   LEFT HEART CATHETERIZATION WITH CORONARY/GRAFT ANGIOGRAM N/A 12/10/2013   Procedure: LEFT HEART CATHETERIZATION WITH Beatrix Fetters;  Surgeon: [name], MD;  Location: [facility] CATH LAB;  Service: Cardiovascular;  Laterality: N/A;   OVARY SURGERY     ovarian cancer   POLYPECTOMY  03/09/2019   Procedure: POLYPECTOMY;  Surgeon: Rogene Houston, MD;  [facility];  Service: Endoscopy;;  cecal    POLYPECTOMY N/A 09/26/2019   Procedure: DUODENAL POLYPECTOMY;  Surgeon: Rogene Houston, MD;  [facility];  Service: Endoscopy;  Laterality: N/A;   REVISION OF ARTERIOVENOUS GORETEX GRAFT N/A 02/24/2017   Procedure: REVISION OF ARTERIOVENOUS GORETEX GRAFT (RESECTION);  Surgeon: Katha Cabal, MD;  Location: [facility] ORS;  Service: Vascular;  Laterality: N/A;   REVISON OF ARTERIOVENOUS FISTULA Left 06/19/2020   Procedure: REVISION OF LEFT UPPER ARM AV GRAFT WITH INTERPOSITION JUMP GRAFT USING 6MM GORE LIMB;  Surgeon: Marty Heck, MD;  Location: [place];  Service: Vascular;  Laterality: Left;   SHUNTOGRAM N/A 10/15/2013   Procedure: Fistulogram;  Surgeon: Serafina Mitchell, MD;  Location: [facility] CATH LAB;  Service: Cardiovascular;  Laterality: N/A;   THROMBECTOMY / ARTERIOVENOUS GRAFT REVISION  2011   left upper arm   TUBAL LIGATION  1980's   UPPER EXTREMITY ANGIOGRAPHY Bilateral 12/06/2016   Procedure: Upper Extremity Angiography;  Surgeon: Katha Cabal, MD;  Location: [place] [facility] LAB;  Service: Cardiovascular;  Laterality: Bilateral;   UPPER EXTREMITY  INTERVENTION Left 06/06/2017   Procedure: UPPER EXTREMITY INTERVENTION;  Surgeon: Katha Cabal, MD;  Location: [place] CV LAB;  Service: Cardiovascular;  Laterality: Left;     OB History     Gravida  2   Para  2   Term      Preterm  2   AB      Living  2      SAB      IAB      Ectopic      Multiple      Live Births              Family History  Problem Relation Age of Onset   Heart disease Mother        Heart Disease before age 35   Hyperlipidemia Mother    Hypertension Mother    Diabetes Mother    Heart attack Mother    Heart disease Father        Heart Disease before age 64   Hyperlipidemia Father    Hypertension Father    Diabetes Father    Diabetes Sister    Hypertension Sister    Diabetes Brother    Hyperlipidemia Brother    Heart attack Brother    Hypertension Sister    Heart attack Brother    Colon cancer Child 42   Other Other        noncontributory for early CAD   Esophageal cancer Neg Hx    Liver disease Neg Hx    Kidney disease Neg Hx    Colon polyps Neg Hx     Social History   Tobacco Use   Smoking status: Never   Smokeless tobacco: Never  Vaping Use   Vaping Use: Never used  Substance Use Topics   Alcohol use: No    Alcohol/week: 0.0 standard drinks   Drug use: No    Home Medications Prior to Admission medications   Medication Sig Start Date End Date Taking? Authorizing  Provider  aspirin EC 81 MG tablet Take 81 mg by mouth daily.  09/27/19   Rehman, Mechele Dawley, MD  atorvastatin (LIPITOR) 10 MG tablet Take 10 mg by mouth daily.    [provider]  benzonatate (TESSALON PERLES) 100 MG capsule Take 1 capsule (100 mg total) by mouth every 6 (six) hours as needed for cough. 02/28/21   Patrecia Pour, MD  diclofenac Sodium (VOLTAREN) 1 % GEL Apply 2 g topically 4 (four) times daily as needed. 03/12/21   Long, Wonda [place]s, MD  fluticasone (FLONASE) 50 MCG/ACT nasal spray Place 1 spray into both nostrils at bedtime as needed for allergies.     [provider]  hydrOXYzine (VISTARIL) 100 MG capsule Take 100 mg by mouth daily as needed for anxiety or itching. 11/23/20   [provider]  isosorbide mononitrate (IMDUR) 60 MG 24 hr tablet Take 1 tablet (60 mg total) by mouth in the morning and at bedtime. 10/20/20 03/12/21  Strader, Fransisco Hertz, PA-C  levothyroxine (SYNTHROID) 25 MCG tablet Take 1 tablet (25 mcg total) by mouth daily. 02/02/21   Brita Romp, NP  lidocaine-prilocaine (EMLA) cream Apply 1 application topically every Monday, Wednesday, and Friday. Prior to dialysis    [provider]  loperamide (IMODIUM A-D) 2 MG tablet Take 2 tablets (4 mg total) by mouth daily as needed for diarrhea or loose stools. 12/17/20   Thurnell Lose, MD  loratadine (CLARITIN) 10 MG tablet Take 1 tablet (  10 mg total) by mouth daily as needed for allergies. Patient not taking: No sig reported 07/14/18   Hosie Poisson, MD  metoprolol tartrate (LOPRESSOR) 25 MG tablet Take 1 tablet (25 mg total) by mouth 2 (two) times daily. Patient not taking: No sig reported 12/17/20   Thurnell Lose, MD  midodrine (PROAMATINE) 5 MG tablet Take Monday, Wednesday, Friday morning BEFORE dialysis 12/22/20   Arnoldo Lenis, MD  multivitamin (RENA-VIT) TABS tablet Take 1 tablet by mouth daily.    [provider]  nitroGLYCERIN (NITROSTAT) 0.4 MG SL tablet Place 1 tablet  (0.4 mg total) under the tongue every 5 (five) minutes x 3 doses as needed for chest pain (if no relief after 2nd dose, proceed to the ED for an evaluation or call 911). 02/19/21   Verta Ellen., NP  omeprazole (PRILOSEC) 20 MG capsule Take 1 capsule (20 mg total) by mouth daily. 12/01/20   Rogene Houston, MD  sevelamer carbonate (RENVELA) 800 MG tablet Take 2 tablets (1,600 mg total) by mouth 3 (three) times daily with meals. 06/20/20   Meccariello, Bernita Raisin, DO  simvastatin (ZOCOR) 20 MG tablet Take 20 mg by mouth daily.    [provider]    Allergies    Aspirin, Penicillins, Amlodipine, Bactrim [sulfamethoxazole-trimethoprim], Contrast media [iodinated diagnostic agents], Iron, Nitrofurantoin, Tylenol [acetaminophen], Gabapentin, Hydralazine, Levofloxacin, No known allergies, Ranexa [ranolazine], Dexilant [dexlansoprazole], Levaquin [levofloxacin in d5w], Morphine and related, Plavix [clopidogrel bisulfate], Protonix [pantoprazole sodium], and Venofer [ferric oxide]  Review of Systems   Review of Systems  Respiratory:  Negative for shortness of breath.   Cardiovascular:  Positive for chest pain.  Musculoskeletal:  Positive for arthralgias.  All other systems reviewed and are negative.  Physical Exam Updated Vital Signs BP (!) 176/58   Pulse 80   Temp 98.4 F (36.9 C) (Oral)   Resp (!) 22   Ht 5\' 1"  (1.549 m)   Wt 47.6 kg   SpO2 96%   BMI 19.83 kg/m   Physical Exam Vitals and nursing note reviewed.  Constitutional:      General: She is not in acute distress.    Appearance: Normal appearance. She is well-developed.  HENT:     Head: Normocephalic and atraumatic.     Right Ear: Hearing normal.     Left Ear: Hearing normal.     Nose: Nose normal.  Eyes:     Conjunctiva/sclera: Conjunctivae normal.     Pupils: Pupils are equal, round, and reactive to light.  Cardiovascular:     Rate and Rhythm: Regular rhythm.     Heart sounds: S1 normal and S2 normal. No  murmur heard.   No friction rub. Gallop present. S4 sounds present.  Pulmonary:     Effort: Pulmonary effort is normal. No respiratory distress.     Breath sounds: Normal breath sounds.  Chest:     Chest wall: No tenderness.  Abdominal:     General: Bowel sounds are normal.     Palpations: Abdomen is soft.     Tenderness: There is no abdominal tenderness. There is no guarding or rebound. Negative signs include Murphy's sign and McBurney's sign.     Hernia: No hernia is present.  Musculoskeletal:     Cervical back: Normal range of motion and neck supple.     Left knee: No swelling, deformity, effusion, erythema or ecchymosis. Decreased range of motion. Tenderness present.  Skin:    General: Skin is warm and dry.  Findings: No rash.  Neurological:     Mental Status: She is alert and oriented to person, place, and time.     GCS: GCS eye subscore is 4. GCS verbal subscore is 5. GCS motor subscore is 6.     Cranial Nerves: No cranial nerve deficit.     Sensory: No sensory deficit.     Coordination: Coordination normal.  Psychiatric:        Speech: Speech normal.        Behavior: Behavior normal.        Thought Content: Thought content normal.    ED Results / Procedures / Treatments   Labs (all labs ordered are listed, but only abnormal results are displayed) Labs Reviewed  BASIC METABOLIC PANEL - Abnormal; Notable for the following components:      Result Value   Glucose, Bld 104 (*)    BUN 37 (*)    Creatinine, Ser 7.76 (*)    GFR, Estimated 5 (*)    All other components within normal limits  CBC - Abnormal; Notable for the following components:   RBC 2.73 (*)    Hemoglobin 9.5 (*)    HCT 31.4 (*)    MCV 115.0 (*)    MCH 34.8 (*)    RDW 21.2 (*)    All other components within normal limits  TROPONIN I (HIGH SENSITIVITY) - Abnormal; Notable for the following components:   Troponin I (High Sensitivity) 54 (*)    All other components within normal limits  RESP PANEL BY  RT-PCR (FLU A&B, COVID) ARPGX2  TROPONIN I (HIGH SENSITIVITY)    EKG None  Radiology DG Chest Port 1 View  Result Date: 03/15/2021 CLINICAL DATA:  Chest pain EXAM: PORTABLE CHEST 1 VIEW COMPARISON:  03/12/2021 FINDINGS: Cardiomegaly with vascular congestion. Prior CABG. Bibasilar airspace opacities with small effusions, similar to prior study. No overt edema. No acute bony abnormality. IMPRESSION: Cardiomegaly, vascular congestion. Bibasilar atelectasis or infiltrates with small effusions. Electronically Signed   By: Rolm Baptise M.D.   On: 03/15/2021 01:28   DG Knee Complete 4 Views Left  Result Date: 03/15/2021 CLINICAL DATA:  Left leg pain EXAM: LEFT KNEE - COMPLETE 4+ VIEW COMPARISON:  None. FINDINGS: No acute bony abnormality. Specifically, no fracture, subluxation, or dislocation. No joint effusion. Joint spaces maintained. Vascular calcifications noted. IMPRESSION: No acute bony abnormality. Electronically Signed   By: Rolm Baptise M.D.   On: 03/15/2021 01:27    Procedures Procedures   Angiocath insertion Performed by: Orpah Greek  Consent: Verbal consent obtained. Risks and benefits: risks, benefits and alternatives were discussed Time out: Immediately prior to procedure a "time out" was called to verify the correct patient, procedure, equipment, support staff and site/side marked as required.  Preparation: Patient was prepped and draped in the usual sterile fashion.  Vein Location: right upper arm  Ultrasound Guided  Gauge: 20  Normal blood return and flush without difficulty Patient tolerance: Patient tolerated the procedure well with no immediate complications.    Medications Ordered in ED Medications  nitroGLYCERIN (NITROGLYN) 2 % ointment 1 inch (has no administration in time range)    ED Course  I have reviewed the triage vital signs and the nursing notes.  Pertinent labs & imaging results that were available during my care of the patient were  reviewed by me and considered in my medical decision making (see chart for details).    MDM Rules/Calculators/A&P  Patient presents to the emergency department for evaluation of chest pain.  Patient has a history of CAD, status post bypass and stenting.  Patient reports that she does have to take nitroglycerin approximately 1 time per week for similar chest pain.  Tonight the single nitroglycerin did not help and she had to take 3 total and did not have complete resolution of her pain.  She reports allergic reaction to aspirin, not given.  Patient also reports that she has having left knee pain.  She had a fall several days ago.  It seems that she did have some chest wall pain after that fall as well as left knee pain.  X-ray of knee is unremarkable.  Pain today seems not typical for simple musculoskeletal chest pain.  Troponin is mildly elevated but it looks that this might be chronic.   With her chest pain not relieved by nitroglycerin and significant cardiac history, patient will be observed in the hospital. Final Clinical Impression(s) / ED Diagnoses Final diagnoses:  Chest pain  Pain, knee    Rx / DC Orders ED Discharge Orders     None        Alese Furniss, Gwenyth Allegra, MD 03/15/21 4604    Orpah Greek, MD 03/15/21 [phone]

## 2021-03-15 NOTE — ED Notes (Signed)
XR at bedside

## 2021-03-16 DIAGNOSIS — D631 Anemia in chronic kidney disease: Secondary | ICD-10-CM

## 2021-03-16 DIAGNOSIS — I5032 Chronic diastolic (congestive) heart failure: Secondary | ICD-10-CM

## 2021-03-16 DIAGNOSIS — R079 Chest pain, unspecified: Secondary | ICD-10-CM | POA: Diagnosis not present

## 2021-03-16 DIAGNOSIS — E785 Hyperlipidemia, unspecified: Secondary | ICD-10-CM

## 2021-03-16 DIAGNOSIS — I25118 Atherosclerotic heart disease of native coronary artery with other forms of angina pectoris: Secondary | ICD-10-CM

## 2021-03-16 DIAGNOSIS — E119 Type 2 diabetes mellitus without complications: Secondary | ICD-10-CM

## 2021-03-16 DIAGNOSIS — Z992 Dependence on renal dialysis: Secondary | ICD-10-CM

## 2021-03-16 DIAGNOSIS — I480 Paroxysmal atrial fibrillation: Secondary | ICD-10-CM

## 2021-03-16 DIAGNOSIS — I10 Essential (primary) hypertension: Secondary | ICD-10-CM

## 2021-03-16 DIAGNOSIS — N186 End stage renal disease: Secondary | ICD-10-CM | POA: Diagnosis not present

## 2021-03-16 MED ORDER — METOPROLOL TARTRATE 25 MG PO TABS: 12.5000 mg | ORAL_TABLET | Freq: Two times a day (BID) | ORAL | 1 refills | Status: DC

## 2021-03-16 NOTE — Discharge Summary (Signed)
Physician Discharge Summary  Shawna [name] TOI:[id number] DOB: 1940/11/08 DOA: 03/15/2021  PCP: [facility]  Admit date: 03/15/2021 Discharge date: 03/16/2021  Time spent: 30 minutes  Recommendations for Outpatient Follow-up:  Reassess patient CBGs have been needed start treatment with hypoglycemic agents. Follow patient blood pressure and adjust antihypertensive regimen as needed Please make sure patient has follow-up with cardiology service as instructed Follow final results of 2D echo which was pending at time of discharge.   Discharge Diagnoses:  Principal Problem:   Chest pain Active Problems:   Essential hypertension, benign   ESRD on hemodialysis (HCC)   HLD (hyperlipidemia)   GERD (gastroesophageal reflux disease)   Anemia in chronic kidney disease   Chronic diastolic CHF (congestive heart failure) (HCC)   Coronary artery disease   Paroxysmal atrial fibrillation (HCC)   Elevated troponin   Diabetes mellitus type 2 in nonobese (HCC)   Hypothyroidism (acquired)   Left knee pain   Discharge Condition: Stable and improved.  Discharged home with instruction to follow-up with PCP and cardiology as an outpatient.  CODE STATUS: Full code.  Diet recommendation: Heart healthy modified carbohydrate diet.  Filed Weights   03/15/21 0027 03/15/21 1240  Weight: 47.6 kg 47.6 kg    History of present illness:  As per H&P written by Dr. Olevia Bowens on 03/15/2021 Shawna [name] is a 80 y.o. female with medical history significant of anxiety, osteoarthritis, colon AVM, CAD, chronic bronchitis, history of colon cancer, esophageal stricture, ESRD on hemodialysis MWF, GERD, hyperlipidemia, gout, PUD, history of unspecified GI bleeding, history of blood transfusion, hypertension, iron deficiency anemia, history of pneumonia, TIA, right jugular vein occlusion, mitral regurgitation, NSVT, paroxysmal atrial fibrillation, CABG in 2013, DES to D1 in April 2018, carotid artery disease, chronic  diastolic heart failure who is coming to the emergency department due to her typical anginal pain that was more intense than usual and took 3 SL NTG tablets to improve.  The patient states that she normally gets relief with 1 tablet.  Her pain radiates to her back and left shoulder.  She felt mildly dyspneic, but denied diaphoresis, nausea, emesis, lightheadedness or palpitations.  However, the patient stated several hours earlier in the evening she felt a brief episode of palpitations with no other symptoms.  She gets occasional lower extremity edema, deny PND or orthopnea.  She denied fever, chills, rhinorrhea, sore throat, productive cough, wheezing or hemoptysis.  No appetite changes, abdominal pain, constipation, melena or hematochezia.  She had a self-limited episode of diarrhea several days ago.  She no longer urinates after developing ESRD.  She has frequent insomnia.    The patient also stated that she continues to have left knee pain.  She was seen on Friday morning due to left knee pain following a fall at home.  Knee x-ray x2 have not shown any acute abnormality.   ED Course: Initial vital signs were temperature 98.4 F, pulse 81, respiration 15, BP 169/63 mmHg O2 sat 95% on room air.   Lab work: Her CBC is her white count of 8.1, hemoglobin 9.5 g/dL platelets 293.  Troponin was 54 and then 66 ng/L.  BMP showed normal electrolytes.  Glucose 104, BUN 37 creatinine 7.76 mg/dL.   Imaging: One-view portable chest radiograph showed cardiomegaly with vascular congestion.  There was bibasilar rectal ectasis versus infiltrates with small effusions.  Left knee 4 view did not show any acute bony abnormality.  However, on the knee x-ray from 3 days ago there was  question for meniscal calcification chondrocalcinosis.  Please see images and full radiology report for further detail.  Hospital Course:  1-Chest pain -Heart score 4 -Improved/resolved with the use of nitroglycerin, resumption of metoprolol  and initiation of Imdur -EKG without acute ischemic changes -Telemetry reassuring -Patient's symptoms never recurred after hemodialysis treatment. -Cardiology service curbside, recommendation to follow-up as an outpatient; echo was done and pending at discharge. -Continue aspirin and statins  2-paroxysmal atrial fibrillation -Not on anticoagulation secondary to GI bleed risk -Continue metoprolol for rate control. -Currently sinus rhythm.  3-gastroesophageal flux disease -Continue PPI.  4-chronic diastolic CHF -Overall compensated -No lower extremity swelling or JVD. -Patient denies shortness of breath and orthopnea. -Low-sodium diet has been encouraged -Continue volume management with hemodialysis -Follow daily weights.  5-essential hypertension -Continue metoprolol and Imdur -Vital signs are stable.  6-ESRD on hemodialysis (Monday, Wednesday and Friday) -Appreciate nephrology service assistance with continuation of dialysis while inpatient. -Next hemodialysis treatment at her dialysis center on 03/17/2021 -Advised to follow heart healthy diet.  7-anemia chronic kidney disease -A stable hemoglobin -No overt bleeding -IV iron and Epogen therapy as per nephrology discretion.  8-left knee pain -Recent x-ray demonstrating no acute fracture -Continue Tylenol and diclofenac gel to assist with pain -Patient instructed to use assistive walking devices for balance and mobility -Home health services for physical therapy has been arranged.  9-hyperlipidemia -Continue statins  10-hypothyroidism -Continue Synthroid  11-history of type 2 diabetes noninsulin-dependent with nephropathy -Continue to follow CBGs -Currently on diet control  Procedures: See below for x-ray reports 2D echo pending  Consultations: Nephrology service  Discharge Exam: Vitals:   03/16/21 0407 03/16/21 1353  BP: (!) 168/57 (!) 175/56  Pulse: 64 61  Resp: 19 18  Temp: 98.6 F (37 C) 98.6 F (37  C)  SpO2: 99% 98%    General: No chest pain, no nausea, no vomiting, no shortness of breath.  Tolerated dialysis without any complication. Cardiovascular: Rate controlled, no rubs, no gallops, no JVD. Respiratory: Clear to auscultation bilaterally, no using accessory muscle.  Good saturation on room air. Abdomen: Soft, nontender, distended, positive bowel sounds Extremities: No cyanosis or clubbing.  Discharge Instructions   Discharge Instructions     (HEART FAILURE PATIENTS) Call MD:  Anytime you have any of the following symptoms: 1) 3 pound weight gain in 24 hours or 5 pounds in 1 week 2) shortness of breath, with or without a dry hacking cough 3) swelling in the hands, feet or stomach 4) if you have to sleep on extra pillows at night in order to breathe.   Complete by: As directed    Diet - low sodium heart healthy   Complete by: As directed    Discharge instructions   Complete by: As directed    Maintain adequate hydration Take medications as prescribed Arrange follow-up with PCP in 10 days Resume outpatient hemodialysis treatment as previously schedule. Follow-up with cardiology service as an outpatient.      Allergies as of 03/16/2021       Reactions   Aspirin Other (See Comments)   High Doses Mess up her stomach; "makes my bowels have blood in them". Takes 81 mg EC Aspirin    Penicillins Other (See Comments)   SYNCOPE? , "makes me real weak when I take it; like I'll pass out" Has patient had a PCN reaction causing immediate rash, facial/tongue/throat swelling, SOB or lightheadedness with hypotension: Yes Has patient had a PCN reaction causing severe rash involving mucus membranes or  skin necrosis: no Has patient had a PCN reaction that required hospitalization no Has patient had a PCN reaction occurring within the last 10 years: no If all of the above   Amlodipine Swelling   Bactrim [sulfamethoxazole-trimethoprim] Rash   Contrast Media [iodinated Diagnostic Agents]  Itching   Iron Itching, Other (See Comments)   "they gave me iron in dialysis; had to give me Benadryl cause I had to have the iron" (05/02/2012)   Nitrofurantoin Hives   Tylenol [acetaminophen] Itching, Other (See Comments)   Makes her feet on fire per pt   Gabapentin Other (See Comments)   Unknown reaction   Hydralazine Itching   Has tolerated while inpatient   Levofloxacin    No Known Allergies    Ranexa [ranolazine] Other (See Comments)   Myoclonus-hospitalized    Dexilant [dexlansoprazole] Other (See Comments)   Upset stomach   Levaquin [levofloxacin In D5w] Rash   Morphine And Related Itching, Other (See Comments)   Itching in feet   Plavix [clopidogrel Bisulfate] Rash   Protonix [pantoprazole Sodium] Rash   Venofer [ferric Oxide] Itching, Other (See Comments)   Patient reports using Benadryl prior to doses as [place]        Medication List     TAKE these medications    aspirin EC 81 MG tablet Take 81 mg by mouth daily.   atorvastatin 10 MG tablet Commonly known as: LIPITOR Take 10 mg by mouth daily.   benzonatate 100 MG capsule Commonly known as: Tessalon Perles Take 1 capsule (100 mg total) by mouth every 6 (six) hours as needed for cough.   diclofenac Sodium 1 % Gel Commonly known as: Voltaren Apply 2 g topically 4 (four) times daily as needed. What changed: reasons to take this   fluticasone 50 MCG/ACT nasal spray Commonly known as: FLONASE Place 1 spray into both nostrils at bedtime as needed for allergies.   hydrOXYzine 100 MG capsule Commonly known as: VISTARIL Take 100 mg by mouth daily as needed for anxiety or itching.   isosorbide mononitrate 60 MG 24 hr tablet Commonly known as: IMDUR Take 1 tablet (60 mg total) by mouth in the morning and at bedtime.   levothyroxine 25 MCG tablet Commonly known as: SYNTHROID Take 1 tablet (25 mcg total) by mouth daily.   lidocaine-prilocaine cream Commonly known as: EMLA Apply 1 application  topically every Monday, Wednesday, and Friday. Prior to dialysis   loperamide 2 MG tablet Commonly known as: IMODIUM A-D Take 2 tablets (4 mg total) by mouth daily as needed for diarrhea or loose stools.   loratadine 10 MG tablet Commonly known as: CLARITIN Take 1 tablet (10 mg total) by mouth daily as needed for allergies.   metoprolol tartrate 25 MG tablet Commonly known as: LOPRESSOR Take 0.5 tablets (12.5 mg total) by mouth 2 (two) times daily. What changed: how much to take   midodrine 5 MG tablet Commonly known as: PROAMATINE Take Monday, Wednesday, Friday morning BEFORE dialysis   multivitamin Tabs tablet Take 1 tablet by mouth daily.   nitroGLYCERIN 0.4 MG SL tablet Commonly known as: NITROSTAT Place 1 tablet (0.4 mg total) under the tongue every 5 (five) minutes x 3 doses as needed for chest pain (if no relief after 2nd dose, proceed to the ED for an evaluation or call 911).   omeprazole 20 MG capsule Commonly known as: PRILOSEC Take 1 capsule (20 mg total) by mouth daily.   sevelamer carbonate 800 MG tablet Commonly known  as: RENVELA Take 2 tablets (1,600 mg total) by mouth 3 (three) times daily with meals.   simvastatin 20 MG tablet Commonly known as: ZOCOR Take 20 mg by mouth daily.       Allergies  Allergen Reactions   Aspirin Other (See Comments)    High Doses Mess up her stomach; "makes my bowels have blood in them". Takes 81 mg EC Aspirin    Penicillins Other (See Comments)    SYNCOPE? , "makes me real weak when I take it; like I'll pass out"  Has patient had a PCN reaction causing immediate rash, facial/tongue/throat swelling, SOB or lightheadedness with hypotension: Yes Has patient had a PCN reaction causing severe rash involving mucus membranes or skin necrosis: no Has patient had a PCN reaction that required hospitalization no Has patient had a PCN reaction occurring within the last 10 years: no If all of the above   Amlodipine Swelling    Bactrim [Sulfamethoxazole-Trimethoprim] Rash   Contrast Media [Iodinated Diagnostic Agents] Itching   Iron Itching and Other (See Comments)    "they gave me iron in dialysis; had to give me Benadryl cause I had to have the iron" (05/02/2012)   Nitrofurantoin Hives   Tylenol [Acetaminophen] Itching and Other (See Comments)    Makes her feet on fire per pt   Gabapentin Other (See Comments)    Unknown reaction   Hydralazine Itching    Has tolerated while inpatient   Levofloxacin    No Known Allergies    Ranexa [Ranolazine] Other (See Comments)    Myoclonus-hospitalized    Dexilant [Dexlansoprazole] Other (See Comments)    Upset stomach   Levaquin [Levofloxacin In D5w] Rash   Morphine And Related Itching and Other (See Comments)    Itching in feet   Plavix [Clopidogrel Bisulfate] Rash   Protonix [Pantoprazole Sodium] Rash   Venofer [Ferric Oxide] Itching and Other (See Comments)    Patient reports using Benadryl prior to doses as [place]. Schedule an appointment as soon as possible for a visit in 10 day(s).   Contact information: [place] [address] [phone]                 The results of significant diagnostics from this hospitalization (including imaging, microbiology, ancillary and laboratory) are listed below for reference.    Significant Diagnostic Studies: DG Chest 2 View  Result Date: 03/12/2021 CLINICAL DATA:  Fall with pain in the chest EXAM: CHEST - 2 VIEW COMPARISON:  02/27/2021 FINDINGS: Diffuse interstitial prominence compared to prior studies, likely with cephalized flow. Streaky density at the lung bases which has been seen on recent priors. No convincing change from prior. Mild cardiomegaly. Prior CABG. IMPRESSION: 1. Vascular congestion/edema with small pleural effusions. 2. Chronic bronchitic markings at the bases. Electronically Signed   By: Monte Fantasia M.D.   On: 03/12/2021  04:37   DG Chest Port 1 View  Result Date: 03/15/2021 CLINICAL DATA:  Chest pain EXAM: PORTABLE CHEST 1 VIEW COMPARISON:  03/12/2021 FINDINGS: Cardiomegaly with vascular congestion. Prior CABG. Bibasilar airspace opacities with small effusions, similar to prior study. No overt edema. No acute bony abnormality. IMPRESSION: Cardiomegaly, vascular congestion. Bibasilar atelectasis or infiltrates with small effusions. Electronically Signed   By: Rolm Baptise M.D.   On: 03/15/2021 01:28   DG Chest Portable 1 View  Result Date: 02/27/2021 CLINICAL DATA:  Chest pain EXAM: PORTABLE CHEST  1 VIEW COMPARISON:  02/03/2021 FINDINGS: Lung volumes are small and pulmonary insufflation has diminished since prior examination. Superimposed perihilar interstitial pulmonary infiltrate has improved in the interval since prior examination. Small bilateral pleural effusions are present. No pneumothorax. Cardiac size is within normal limits. Coronary artery bypass grafting has been performed. No acute bone abnormality. IMPRESSION: Progressive pulmonary hypoinflation. Improving pulmonary edema, likely cardiogenic, now mild in severity. Electronically Signed   By: Fidela Salisbury M.D.   On: 02/27/2021 02:46   DG Knee Complete 4 Views Left  Result Date: 03/15/2021 CLINICAL DATA:  Left leg pain EXAM: LEFT KNEE - COMPLETE 4+ VIEW COMPARISON:  None. FINDINGS: No acute bony abnormality. Specifically, no fracture, subluxation, or dislocation. No joint effusion. Joint spaces maintained. Vascular calcifications noted. IMPRESSION: No acute bony abnormality. Electronically Signed   By: Rolm Baptise M.D.   On: 03/15/2021 01:27   DG Knee Complete 4 Views Left  Result Date: 03/12/2021 CLINICAL DATA:  Fall.  Knee pain. EXAM: LEFT KNEE - COMPLETE 4+ VIEW COMPARISON:  None. FINDINGS: No evidence for an acute fracture. No subluxation or dislocation. No joint effusion. Bones are demineralized. Surgical clips are noted in the medial soft tissues  adjacent to the proximal tibia. Meniscal calcification evident. IMPRESSION: 1. No acute bony abnormality. 2. Meniscal calcification. Chondrocalcinosis can be associated with CPPD. Electronically Signed   By: Misty Stanley M.D.   On: 03/12/2021 05:06    Microbiology: Recent Results (from the past 240 hour(s))  Resp Panel by RT-PCR (Flu A&B, Covid) Nasopharyngeal Swab     Status: None   Collection Time: 03/15/21 12:32 AM   Specimen: Nasopharyngeal Swab; Nasopharyngeal(NP) swabs in vial transport medium  Result Value Ref Range Status   SARS Coronavirus 2 by RT PCR NEGATIVE NEGATIVE Final    Comment: (NOTE) SARS-CoV-2 target nucleic acids are NOT DETECTED.  The SARS-CoV-2 RNA is generally detectable in upper respiratory specimens during the acute phase of infection. The lowest concentration of SARS-CoV-2 viral copies this assay can detect is 138 copies/mL. A negative result does not preclude SARS-Cov-2 infection and should not be used as the sole basis for treatment or other patient management decisions. A negative result may occur with  improper specimen collection/handling, submission of specimen other than nasopharyngeal swab, presence of viral mutation(s) within the areas targeted by this assay, and inadequate number of viral copies(<138 copies/mL). A negative result must be combined with clinical observations, patient history, and epidemiological information. The expected result is Negative.  Fact Sheet for Patients:  EntrepreneurPulse.com.au  Fact Sheet for Healthcare Providers:  IncredibleEmployment.be  This test is no t yet approved or cleared by the [place] FDA and  has been authorized for detection and/or diagnosis of SARS-CoV-2 by FDA under an Emergency Use Authorization (EUA). This EUA will remain  in effect (meaning this test can be used) for the duration of the COVID-19 declaration under Section 564(b)(1) of the Act,  21 U.S.C.section 360bbb-3(b)(1), unless the authorization is terminated  or revoked sooner.       Influenza A by PCR NEGATIVE NEGATIVE Final   Influenza B by PCR NEGATIVE NEGATIVE Final    Comment: (NOTE) The Xpert Xpress SARS-CoV-2/FLU/RSV plus assay is intended as an aid in the diagnosis of influenza from Nasopharyngeal swab specimens and should not be used as a sole basis for treatment. Nasal washings and aspirates are unacceptable for Xpert Xpress SARS-CoV-2/FLU/RSV testing.  Fact Sheet for Patients: EntrepreneurPulse.com.au  Fact Sheet for Healthcare Providers: IncredibleEmployment.be  This test is  not yet approved or cleared by the [place] and has been authorized for detection and/or diagnosis of SARS-CoV-2 by FDA under an Emergency Use Authorization (EUA). This EUA will remain in effect (meaning this test can be used) for the duration of the COVID-19 declaration under Section 564(b)(1) of the Act, 21 U.S.C. section 360bbb-3(b)(1), unless the authorization is terminated or revoked.  Performed at [facility], [address], [place], [place] [address]      Labs: Basic Metabolic Panel: Recent Labs  Lab 03/12/21 0413 03/15/21 0129  NA 141 136  K 4.1 4.1  CL 100 98  CO2 29 27  GLUCOSE 111* 104*  BUN 26* 37*  CREATININE 5.97* 7.76*  CALCIUM 9.3 8.9   CBC: Recent Labs  Lab 03/12/21 0413 03/15/21 0129  WBC 7.4 8.1  HGB 8.9* 9.5*  HCT 28.5* 31.4*  MCV 111.3* 115.0*  PLT 313 293   BNP (last 3 results) Recent Labs    09/29/20 0112  BNP 161.0*     Signed:  Barton Dubois MD.  [facility] 03/16/2021, 5:21 PM

## 2021-03-16 NOTE — Progress Notes (Signed)
Patient ID: Shawna [name], female   DOB: July 30, 1940, 80 y.o.   MRN: [id number] S: Feels well, no chest pain and tolerated HD without issue yesterday.  Her main complaint is that since her fall she "cant walk" O:BP (!) 168/57 (BP Location: Right Wrist)   Pulse 64   Temp 98.6 F (37 C) (Oral)   Resp 19   Ht 5\' 1"  (1.549 m)   Wt 47.6 kg   SpO2 99%   BMI 19.83 kg/m   Intake/Output Summary (Last 24 hours) at 03/16/2021 0931 Last data filed at 03/15/2021 2100 Gross per 24 hour  Intake 1040 ml  Output 1546 ml  Net -506 ml   Intake/Output: I/O last 3 completed shifts: In: 4098 [P.O.:1040] Out: 1191 [Other:1546]  Intake/Output this shift:  No intake/output data recorded. Weight change: 0 kg Gen:NAD CVS: RRR Resp: CTA Abd: +BS, soft, NT/ND Ext: no edema, LUE AVF +T/B  Recent Labs  Lab 03/12/21 0413 03/15/21 0129  NA 141 136  K 4.1 4.1  CL 100 98  CO2 29 27  GLUCOSE 111* 104*  BUN 26* 37*  CREATININE 5.97* 7.76*  CALCIUM 9.3 8.9   Liver Function Tests: No results for input(s): AST, ALT, ALKPHOS, BILITOT, PROT, ALBUMIN in the last 168 hours. No results for input(s): LIPASE, AMYLASE in the last 168 hours. No results for input(s): AMMONIA in the last 168 hours. CBC: Recent Labs  Lab 03/12/21 0413 03/15/21 0129  WBC 7.4 8.1  HGB 8.9* 9.5*  HCT 28.5* 31.4*  MCV 111.3* 115.0*  PLT 313 293   Cardiac Enzymes: No results for input(s): CKTOTAL, CKMB, CKMBINDEX, TROPONINI in the last 168 hours. CBG: No results for input(s): GLUCAP in the last 168 hours.  Iron Studies: No results for input(s): IRON, TIBC, TRANSFERRIN, FERRITIN in the last 72 hours. Studies/Results: DG Chest Port 1 View  Result Date: 03/15/2021 CLINICAL DATA:  Chest pain EXAM: PORTABLE CHEST 1 VIEW COMPARISON:  03/12/2021 FINDINGS: Cardiomegaly with vascular congestion. Prior CABG. Bibasilar airspace opacities with small effusions, similar to prior study. No overt edema. No acute bony abnormality. IMPRESSION:  Cardiomegaly, vascular congestion. Bibasilar atelectasis or infiltrates with small effusions. Electronically Signed   By: Rolm Baptise M.D.   On: 03/15/2021 01:28   DG Knee Complete 4 Views Left  Result Date: 03/15/2021 CLINICAL DATA:  Left leg pain EXAM: LEFT KNEE - COMPLETE 4+ VIEW COMPARISON:  None. FINDINGS: No acute bony abnormality. Specifically, no fracture, subluxation, or dislocation. No joint effusion. Joint spaces maintained. Vascular calcifications noted. IMPRESSION: No acute bony abnormality. Electronically Signed   By: Rolm Baptise M.D.   On: 03/15/2021 01:27    aspirin EC  81 mg Oral Daily   atorvastatin  10 mg Oral Daily   Chlorhexidine Gluconate Cloth  6 each Topical Q0600   darbepoetin (ARANESP) injection - DIALYSIS  100 mcg Intravenous Q Mon-HD   isosorbide mononitrate  60 mg Oral Daily   levothyroxine  25 mcg Oral Daily   metoprolol tartrate  12.5 mg Oral BID   multivitamin  1 tablet Oral Daily   pantoprazole  40 mg Oral Daily   sevelamer carbonate  1,600 mg Oral TID WC    BMET    Component Value Date/Time   NA 136 03/15/2021 0129   K 4.1 03/15/2021 0129   CL 98 03/15/2021 0129   CO2 27 03/15/2021 0129   GLUCOSE 104 (H) 03/15/2021 0129   BUN 37 (H) 03/15/2021 0129   CREATININE 7.76 (H) 03/15/2021  0129   CREATININE 6.57 (H) 03/05/2019 1159   CALCIUM 8.9 03/15/2021 0129   CALCIUM 8.1 (L) 05/29/2013 1814   GFRNONAA 5 (L) 03/15/2021 0129   GFRNONAA 6 (L) 03/05/2019 1159   GFRAA 9 (L) 02/12/2020 1712   GFRAA 6 (L) 03/05/2019 1159   CBC    Component Value Date/Time   WBC 8.1 03/15/2021 0129   RBC 2.73 (L) 03/15/2021 0129   HGB 9.5 (L) 03/15/2021 0129   HCT 31.4 (L) 03/15/2021 0129   PLT 293 03/15/2021 0129   MCV 115.0 (H) 03/15/2021 0129   MCH 34.8 (H) 03/15/2021 0129   MCHC 30.3 03/15/2021 0129   RDW 21.2 (H) 03/15/2021 0129   LYMPHSABS 0.9 02/27/2021 0311   MONOABS 0.4 02/27/2021 0311   EOSABS 0.3 02/27/2021 0311   BASOSABS 0.1 02/27/2021 0311    Dialyzes at [facility] - MWF-  3.5 hours ( I cannot get throught to them today-  orders from last time as follows)  EDW 55. HD Bath 2k/2.5 ca, Dialyzer unknown, Heparin none. Access AVG BFR 300, DFR 500- 15 guage.  Assessment/Plan:  Chest pain - different from her typical angina, more intense and not relieved with NTG.  ECHO pending. Cardiology has not been officially consulted at this time.  Plan per primary. ESRD- will continue with HD on MWF schedule. HTN - stable Anemia of ESRD - continue with ESA and follow. MBD- stable Knee pain - xray negative.  Will need PT/OT evaluation as she states that she cannot walk due to knee pain. Disposition - per primary svc.  Donetta Potts, MD [employer] [phone]

## 2021-03-16 NOTE — Care Management Obs Status (Signed)
[place] NOTIFICATION   Patient Details  Name: Shawna [name] MRN: [id number] Date of Birth: 1940-09-18   Medicare Observation Status Notification Given:  Yes    [name] 03/16/2021, 1:32 PM

## 2021-03-24 LAB — ECHOCARDIOGRAM COMPLETE
AR max vel: 1.8 cm2
AV Area VTI: 1.85 cm2
AV Area mean vel: 1.93 cm2
AV Mean grad: 5 mmHg
AV Peak grad: 11 mmHg
Ao pk vel: 1.66 m/s
Area-P 1/2: 4.21 cm2
Calc EF: 49.1 %
Height: 61 in
MV M vel: 5.77 m/s
MV Peak grad: 133.2 mmHg
MV VTI: 1.56 cm2
Radius: 0.5 cm
S' Lateral: 3.05 cm
Single Plane A2C EF: 49.4 %
Single Plane A4C EF: 46.9 %
Weight: 1679.02 oz

## 2021-03-25 ENCOUNTER — Other Ambulatory Visit: Payer: Self-pay | Admitting: *Deleted

## 2021-03-25 MED ORDER — ISOSORBIDE MONONITRATE ER 60 MG PO TB24: 60.0000 mg | ORAL_TABLET | Freq: Two times a day (BID) | ORAL | 3 refills | Status: DC

## 2021-03-30 ENCOUNTER — Other Ambulatory Visit: Payer: Self-pay | Admitting: Student

## 2021-03-31 ENCOUNTER — Telehealth: Payer: Self-pay

## 2021-03-31 ENCOUNTER — Telehealth: Payer: Self-pay | Admitting: Cardiology

## 2021-03-31 NOTE — Telephone Encounter (Signed)
Pt's husband came into the office with a bottle of Amiodarone and they're wanting to know if she's supposed to be taking it.   Please call [phone] or call cell phone [phone]

## 2021-03-31 NOTE — Telephone Encounter (Signed)
Per hospital d/c note 12/2020, amiodarone was stopped due to elevated TSH.Husband made aware.

## 2021-03-31 NOTE — Telephone Encounter (Signed)
Error

## 2021-04-06 ENCOUNTER — Other Ambulatory Visit: Payer: Self-pay | Admitting: Neurology

## 2021-04-06 ENCOUNTER — Other Ambulatory Visit (HOSPITAL_COMMUNITY): Payer: Self-pay | Admitting: Neurology

## 2021-04-06 DIAGNOSIS — R600 Localized edema: Secondary | ICD-10-CM

## 2021-04-08 ENCOUNTER — Encounter: Payer: Self-pay | Admitting: Nurse Practitioner

## 2021-04-08 ENCOUNTER — Other Ambulatory Visit: Payer: Self-pay

## 2021-04-08 ENCOUNTER — Ambulatory Visit (INDEPENDENT_AMBULATORY_CARE_PROVIDER_SITE_OTHER): Payer: Medicare HMO | Admitting: Nurse Practitioner

## 2021-04-08 VITALS — BP 102/64 | HR 103 | Ht 62.0 in | Wt 125.0 lb

## 2021-04-08 DIAGNOSIS — E039 Hypothyroidism, unspecified: Secondary | ICD-10-CM

## 2021-04-08 DIAGNOSIS — E041 Nontoxic single thyroid nodule: Secondary | ICD-10-CM

## 2021-04-08 LAB — TSH: TSH: 6.44 u[IU]/mL — ABNORMAL HIGH (ref 0.450–4.500)

## 2021-04-08 LAB — THYROID PEROXIDASE ANTIBODY: Thyroperoxidase Ab SerPl-aCnc: <8 IU/mL (ref 0–34)

## 2021-04-08 LAB — T4, FREE: Free T4: 1.11 ng/dL (ref 0.82–1.77)

## 2021-04-08 LAB — T3, FREE: T3, Free: 1.8 pg/mL — ABNORMAL LOW (ref 2.0–4.4)

## 2021-04-08 LAB — THYROGLOBULIN ANTIBODY: Thyroglobulin Antibody: <1 IU/mL (ref 0.0–0.9)

## 2021-04-08 NOTE — Patient Instructions (Signed)
-   The correct intake of thyroid hormone (Levothyroxine, Synthroid), is on empty stomach first thing in the morning, with water, separated by at least 30 minutes from breakfast and other medications,  and separated by more than 4 hours from calcium, iron, multivitamins, acid reflux medications (PPIs).  - This medication is a life-long medication and will be needed to correct thyroid hormone imbalances for the rest of your life.  The dose may change from time to time, based on thyroid blood work.  - It is extremely important to be consistent taking this medication, near the same time each morning.  -AVOID TAKING PRODUCTS CONTAINING BIOTIN (commonly found in Hair, Skin, Nails vitamins) AS IT INTERFERES WITH THE VALIDITY OF THYROID FUNCTION BLOOD TESTS.  

## 2021-04-08 NOTE — Progress Notes (Signed)
Endocrinology Follow Up Note                                         04/08/2021, 3:07 PM  Subjective:   Subjective    Shawna [name] is a 80 y.o.-year-old female patient being seen in follow up after being seen in consultation for hypothyroidism referred by Practice, [facility].   Past Medical History:  Diagnosis Date   Acute on chronic respiratory failure with hypoxia ([place]) 10/10/2016   Anxiety    Arthritis    AVM (arteriovenous malformation) of colon    CAD (coronary artery disease)    a. s/p CABG in 2013 b. DES to D1 in 10/2016. c. cath in 07/2018 showing patent grafts with occlusion of D1 at prior stent site and progression of PDA disease --> medical management recommended   Carotid artery disease ([place])    a. 97-02% LICA, 12/3783    Chronic anemia    Chronic bronchitis (HCC)    Chronic diastolic CHF (congestive heart failure) ([place])    a. 02/2012 Echo EF 60-65%, nl wall motion, Gr 1 DD, mod MR   Colon cancer ([place]) 1992   Esophageal stricture    ESRD on hemodialysis ([place])    ESRD due to HTN, started dialysis 2011 and gets HD at [facility] with Dr Hinda Lenis on MWF schedule.  Access is LUA AVF as of Sept 2014.    GERD (gastroesophageal reflux disease)    High cholesterol 12/2011   History of blood transfusion 07/2011; 12/2011; 01/2012 X 2; 04/2012   History of gout    History of lower GI bleeding    Hypertension    Iron deficiency anemia    Jugular vein occlusion, right (HCC)    Mitral regurgitation    a. Moderate by echo, 02/2012   Myocardial infarction [facility])    NSVT (nonsustained ventricular tachycardia) ([place])    Ovarian cancer ([place]) 1992   PAF (paroxysmal atrial fibrillation) ([place])    Pneumonia ~ 2009   PUD (peptic ulcer disease)    TIA (transient ischemic attack)     Past Surgical History:  Procedure Laterality Date   A/V SHUNTOGRAM Left 03/19/2019   Procedure: A/V SHUNTOGRAM;  Surgeon: Katha Cabal, MD;  Location: [place] CV [facility];  Service: Cardiovascular;  Laterality: Left;   ABDOMINAL HYSTERECTOMY  1992   APPENDECTOMY  06/1990   AV FISTULA PLACEMENT  07/2009   left upper arm   AV FISTULA PLACEMENT Right 09/06/2016   Procedure: RIGHT FOREARM ARTERIOVENOUS (AV) GRAFT;  Surgeon: Elam Dutch, MD;  Location: [place];  Service: Vascular;  Laterality: Right;   AV FISTULA PLACEMENT N/A 02/24/2017   Procedure: INSERTION OF ARTERIOVENOUS (AV) GORE-TEX GRAFT ARM (BRACHIAL AXILLARY);  Surgeon: Katha Cabal, MD;  Location: [facility] ORS;  Service: Vascular;  Laterality: N/A;   [place] Right 09/06/2016   Procedure: REMOVAL OF Right Arm ARTERIOVENOUS GORETEX GRAFT and Vein Patch angioplasty of brachial artery;  Surgeon: Angelia Mould, MD;  [facility];  Service: Vascular;  Laterality: Right;   BIOPSY  09/26/2019   Procedure: BIOPSY;  Surgeon: Rogene Houston, MD;  [facility];  Service: Endoscopy;;   COLON RESECTION  1992   COLON SURGERY     COLONOSCOPY N/A 03/09/2019   Procedure: COLONOSCOPY;  Surgeon: Rogene Houston, MD;  [facility];  Service: Endoscopy;  Laterality: N/A;   CORONARY ANGIOPLASTY WITH STENT PLACEMENT  12/15/11   "2"   CORONARY ANGIOPLASTY WITH STENT PLACEMENT  y/2013   "1; makes total of 3" (05/02/2012)   CORONARY ARTERY BYPASS GRAFT  06/13/2012   Procedure: CORONARY ARTERY BYPASS GRAFTING (CABG);  Surgeon: Grace Isaac, MD;  Location: [place];  Service: Open Heart Surgery;  Laterality: N/A;  cabg x four;  using left internal mammary artery, and left leg greater saphenous vein harvested endoscopically   CORONARY STENT INTERVENTION N/A 10/13/2016   Procedure: Coronary Stent Intervention;  Surgeon: Troy Sine, MD;  Location: [place] CV LAB;  Service: Cardiovascular;  Laterality: N/A;   DIALYSIS/PERMA CATHETER REMOVAL N/A 04/18/2017   Procedure: DIALYSIS/PERMA CATHETER REMOVAL;  Surgeon: Katha Cabal, MD;  Location:   CV LAB;  Service: Cardiovascular;  Laterality: N/A;   DILATION AND CURETTAGE OF UTERUS     ESOPHAGOGASTRODUODENOSCOPY  01/20/2012   Procedure: ESOPHAGOGASTRODUODENOSCOPY (EGD);  Surgeon: Ladene Artist, MD,FACG;  Location: [facility] ENDOSCOPY;  Service: Endoscopy;  Laterality: N/A;   ESOPHAGOGASTRODUODENOSCOPY N/A 03/26/2013   Procedure: ESOPHAGOGASTRODUODENOSCOPY (EGD);  Surgeon: Irene Shipper, MD;  Location: [facility] ENDOSCOPY;  Service: Endoscopy;  Laterality: N/A;   ESOPHAGOGASTRODUODENOSCOPY N/A 04/30/2015   Procedure: ESOPHAGOGASTRODUODENOSCOPY (EGD);  Surgeon: Rogene Houston, MD;  [facility];  Service: Endoscopy;  Laterality: N/A;  1pm - moved to 10/20 @ 1:10   ESOPHAGOGASTRODUODENOSCOPY N/A 07/29/2016   Procedure: ESOPHAGOGASTRODUODENOSCOPY (EGD);  Surgeon: Manus Gunning, MD;  Location: [place];  Service: Gastroenterology;  Laterality: N/A;  enteroscopy   ESOPHAGOGASTRODUODENOSCOPY N/A 09/26/2019   Procedure: ESOPHAGOGASTRODUODENOSCOPY (EGD);  Surgeon: Rogene Houston, MD;  [facility];  Service: Endoscopy;  Laterality: N/A;  1250   ESOPHAGOGASTRODUODENOSCOPY (EGD) WITH PROPOFOL N/A 02/05/2021   Procedure: ESOPHAGOGASTRODUODENOSCOPY (EGD) WITH PROPOFOL;  Surgeon: Eloise Harman, DO;  [facility];  Service: Endoscopy;  Laterality: N/A;   GIVENS CAPSULE STUDY N/A 03/07/2019   Procedure: GIVENS CAPSULE STUDY;  Surgeon: Rogene Houston, MD;  [facility];  Service: Endoscopy;  Laterality: N/A;  7:30   INSERTION OF DIALYSIS CATHETER N/A 10/05/2020   Procedure: ABORTED TUNNELED DIALYSIS CATHETER PLACEMENT RIGHT INTERNAL JUGULAR VEIN ;  Surgeon: Virl Cagey, MD;  Location: [facility];  Service: General;  Laterality: N/A;   INTRAOPERATIVE TRANSESOPHAGEAL ECHOCARDIOGRAM  06/13/2012   Procedure: INTRAOPERATIVE TRANSESOPHAGEAL ECHOCARDIOGRAM;  Surgeon: Grace Isaac, MD;  Location: [place];  Service: Open Heart Surgery;  Laterality:  N/A;   IR DIALY SHUNT INTRO NEEDLE/INTRACATH INITIAL W/IMG LEFT Left 10/06/2020   IR FLUORO GUIDE CV LINE RIGHT  06/17/2020   IR GENERIC HISTORICAL  07/26/2016   IR FLUORO GUIDE CV LINE RIGHT 07/26/2016 Greggory Keen, MD [facility]   IR GENERIC HISTORICAL  07/26/2016   IR US GUIDE VASC ACCESS RIGHT 07/26/2016 Greggory Keen, MD [facility]   IR GENERIC HISTORICAL  08/02/2016   IR US GUIDE VASC ACCESS RIGHT 08/02/2016 Greggory Keen, MD [facility]   IR GENERIC HISTORICAL  08/02/2016   IR FLUORO GUIDE CV LINE RIGHT  08/02/2016 Greggory Keen, MD [facility]   IR RADIOLOGY PERIPHERAL GUIDED IV START  03/28/2017   IR REMOVAL TUN CV CATH W/O FL  08/11/2020   IR THROMBECTOMY AV FISTULA W/THROMBOLYSIS INC/SHUNT/IMG LEFT Left 06/17/2020   IR US GUIDE VASC ACCESS LEFT  06/17/2020   IR US GUIDE VASC ACCESS RIGHT  03/28/2017   IR US GUIDE VASC ACCESS RIGHT  06/17/2020   LEFT HEART CATH AND CORONARY ANGIOGRAPHY N/A 09/20/2016   Procedure: Left Heart Cath and Coronary Angiography;  Surgeon: [name], MD;  Location: [place] [facility];  Service: Cardiovascular;  Laterality: N/A;   LEFT HEART CATH AND CORS/GRAFTS ANGIOGRAPHY N/A 10/13/2016   Procedure: Left Heart Cath and Cors/Grafts Angiography;  Surgeon: Troy Sine, MD;  Location: [place] CV LAB;  Service: Cardiovascular;  Laterality: N/A;   LEFT HEART CATH AND CORS/GRAFTS ANGIOGRAPHY N/A 07/13/2018   Procedure: LEFT HEART CATH AND CORS/GRAFTS ANGIOGRAPHY;  Surgeon: Martinique, Peter M, MD;  Location: [place] [facility];  Service: Cardiovascular;  Laterality: N/A;   LEFT HEART CATHETERIZATION WITH CORONARY ANGIOGRAM N/A 12/15/2011   Procedure: LEFT HEART CATHETERIZATION WITH CORONARY ANGIOGRAM;  Surgeon: Burnell Blanks, MD;  Location: [facility] CATH LAB;  Service: Cardiovascular;  Laterality: N/A;   LEFT HEART CATHETERIZATION WITH CORONARY ANGIOGRAM N/A 01/10/2012   Procedure: LEFT HEART CATHETERIZATION WITH CORONARY ANGIOGRAM;  Surgeon: Peter M Martinique, MD;   Location: [facility] CATH LAB;  Service: Cardiovascular;  Laterality: N/A;   LEFT HEART CATHETERIZATION WITH CORONARY ANGIOGRAM N/A 06/08/2012   Procedure: LEFT HEART CATHETERIZATION WITH CORONARY ANGIOGRAM;  Surgeon: Burnell Blanks, MD;  Location: [facility] CATH LAB;  Service: Cardiovascular;  Laterality: N/A;   LEFT HEART CATHETERIZATION WITH CORONARY/GRAFT ANGIOGRAM N/A 12/10/2013   Procedure: LEFT HEART CATHETERIZATION WITH Beatrix Fetters;  Surgeon: [name], MD;  Location: [facility] CATH LAB;  Service: Cardiovascular;  Laterality: N/A;   OVARY SURGERY     ovarian cancer   POLYPECTOMY  03/09/2019   Procedure: POLYPECTOMY;  Surgeon: Rogene Houston, MD;  [facility];  Service: Endoscopy;;  cecal    POLYPECTOMY N/A 09/26/2019   Procedure: DUODENAL POLYPECTOMY;  Surgeon: Rogene Houston, MD;  [facility];  Service: Endoscopy;  Laterality: N/A;   REVISION OF ARTERIOVENOUS GORETEX GRAFT N/A 02/24/2017   Procedure: REVISION OF ARTERIOVENOUS GORETEX GRAFT (RESECTION);  Surgeon: Katha Cabal, MD;  Location: [facility] ORS;  Service: Vascular;  Laterality: N/A;   REVISON OF ARTERIOVENOUS FISTULA Left 06/19/2020   Procedure: REVISION OF LEFT UPPER ARM AV GRAFT WITH INTERPOSITION JUMP GRAFT USING 6MM GORE LIMB;  Surgeon: Marty Heck, MD;  Location: [place];  Service: Vascular;  Laterality: Left;   SHUNTOGRAM N/A 10/15/2013   Procedure: Fistulogram;  Surgeon: Serafina Mitchell, MD;  Location: [facility] CATH LAB;  Service: Cardiovascular;  Laterality: N/A;   THROMBECTOMY / ARTERIOVENOUS GRAFT REVISION  2011   left upper arm   TUBAL LIGATION  1980's   UPPER EXTREMITY ANGIOGRAPHY Bilateral 12/06/2016   Procedure: Upper Extremity Angiography;  Surgeon: Katha Cabal, MD;  Location: [place] [facility];  Service: Cardiovascular;  Laterality: Bilateral;   UPPER EXTREMITY INTERVENTION Left 06/06/2017   Procedure: UPPER EXTREMITY INTERVENTION;  Surgeon: Katha Cabal, MD;   Location: [place] [facility] LAB;  Service: Cardiovascular;  Laterality: Left;    Social History   Socioeconomic History   Marital status: Married    Spouse name: [name]   Number of children: Not on file  Years of education: Not on file   Highest education level: Not on file  Occupational History   Not on file  Tobacco Use   Smoking status: Never   Smokeless tobacco: Never  Vaping Use   Vaping Use: Never used  Substance and Sexual Activity   Alcohol use: No    Alcohol/week: 0.0 standard drinks   Drug use: No   Sexual activity: Yes    Birth control/protection: Surgical  Other Topics Concern   Not on file  Social History Narrative   Lives in [place], [place] with husband.  Dialysis pt - mwf.   Social Determinants of Health   Financial Resource Strain: Not on file  Food Insecurity: Not on file  Transportation Needs: Not on file  Physical Activity: Not on file  Stress: Not on file  Social Connections: Not on file    Family History  Problem Relation Age of Onset   Heart disease Mother        Heart Disease before age 58   Hyperlipidemia Mother    Hypertension Mother    Diabetes Mother    Heart attack Mother    Heart disease Father        Heart Disease before age 88   Hyperlipidemia Father    Hypertension Father    Diabetes Father    Diabetes Sister    Hypertension Sister    Diabetes Brother    Hyperlipidemia Brother    Heart attack Brother    Hypertension Sister    Heart attack Brother    Colon cancer Child 63   Other Other        noncontributory for early CAD   Esophageal cancer Neg Hx    Liver disease Neg Hx    Kidney disease Neg Hx    Colon polyps Neg Hx     Outpatient Encounter Medications as of 04/08/2021  Medication Sig   aspirin EC 81 MG tablet Take 81 mg by mouth daily.    atorvastatin (LIPITOR) 10 MG tablet Take 10 mg by mouth daily.   benzonatate (TESSALON PERLES) 100 MG capsule Take 1 capsule (100 mg total) by mouth every 6 (six) hours as needed  for cough. (Patient not taking: Reported on 04/08/2021)   diclofenac Sodium (VOLTAREN) 1 % GEL Apply 2 g topically 4 (four) times daily as needed. (Patient taking differently: Apply 2 g topically 4 (four) times daily as needed (pain).)   fluticasone (FLONASE) 50 MCG/ACT nasal spray Place 1 spray into both nostrils at bedtime as needed for allergies.    hydrOXYzine (VISTARIL) 100 MG capsule Take 100 mg by mouth daily as needed for anxiety or itching.   isosorbide mononitrate (IMDUR) 60 MG 24 hr tablet Take 1 tablet (60 mg total) by mouth in the morning and at bedtime.   levothyroxine (SYNTHROID) 25 MCG tablet Take 1 tablet (25 mcg total) by mouth daily.   lidocaine-prilocaine (EMLA) cream Apply 1 application topically every Monday, Wednesday, and Friday. Prior to dialysis   loperamide (IMODIUM A-D) 2 MG tablet Take 2 tablets (4 mg total) by mouth daily as needed for diarrhea or loose stools.   loratadine (CLARITIN) 10 MG tablet Take 1 tablet (10 mg total) by mouth daily as needed for allergies. (Patient not taking: No sig reported)   metoprolol tartrate (LOPRESSOR) 25 MG tablet Take 0.5 tablets (12.5 mg total) by mouth 2 (two) times daily.   midodrine (PROAMATINE) 5 MG tablet Take Monday, Wednesday, Friday morning BEFORE dialysis   multivitamin (RENA-VIT)  TABS tablet Take 1 tablet by mouth daily.   nitroGLYCERIN (NITROSTAT) 0.4 MG SL tablet Place 1 tablet (0.4 mg total) under the tongue every 5 (five) minutes x 3 doses as needed for chest pain (if no relief after 2nd dose, proceed to the ED for an evaluation or call 911).   omeprazole (PRILOSEC) 20 MG capsule Take 1 capsule (20 mg total) by mouth daily.   sevelamer carbonate (RENVELA) 800 MG tablet Take 2 tablets (1,600 mg total) by mouth 3 (three) times daily with meals.   simvastatin (ZOCOR) 20 MG tablet Take 20 mg by mouth daily.   No facility-administered encounter medications on file as of 04/08/2021.    ALLERGIES: Allergies  Allergen  Reactions   Aspirin Other (See Comments)    High Doses Mess up her stomach; "makes my bowels have blood in them". Takes 81 mg EC Aspirin    Penicillins Other (See Comments)    SYNCOPE? , "makes me real weak when I take it; like I'll pass out"  Has patient had a PCN reaction causing immediate rash, facial/tongue/throat swelling, SOB or lightheadedness with hypotension: Yes Has patient had a PCN reaction causing severe rash involving mucus membranes or skin necrosis: no Has patient had a PCN reaction that required hospitalization no Has patient had a PCN reaction occurring within the last 10 years: no If all of the above   Amlodipine Swelling   Bactrim [Sulfamethoxazole-Trimethoprim] Rash   Contrast Media [Iodinated Diagnostic Agents] Itching   Iron Itching and Other (See Comments)    "they gave me iron in dialysis; had to give me Benadryl cause I had to have the iron" (05/02/2012)   Nitrofurantoin Hives   Tylenol [Acetaminophen] Itching and Other (See Comments)    Makes her feet on fire per pt   Gabapentin Other (See Comments)    Unknown reaction   Hydralazine Itching    Has tolerated while inpatient   Levofloxacin    No Known Allergies    Ranexa [Ranolazine] Other (See Comments)    Myoclonus-hospitalized    Dexilant [Dexlansoprazole] Other (See Comments)    Upset stomach   Levaquin [Levofloxacin In D5w] Rash   Morphine And Related Itching and Other (See Comments)    Itching in feet   Plavix [Clopidogrel Bisulfate] Rash   Protonix [Pantoprazole Sodium] Rash   Venofer [Ferric Oxide] Itching and Other (See Comments)    Patient reports using Benadryl prior to doses as [place]: Immunization History  Administered Date(s) Administered   Influenza-Unspecified 03/11/2012   Moderna Sars-Covid-2 Vaccination 09/04/2019, 10/04/2019   Pneumococcal Polysaccharide-23 05/07/2020   Pneumococcal-Unspecified 03/11/2008   Tdap 01/31/2018     HPI   Shawna [name]  is a patient with the above medical history. she was diagnosed with hypothyroidism recently during a hospitalization, which prompted her referral here.  She is a resident at a nursing home and her husband is accompanying her today.  She is currently on Levothyroxine 25 mcg po daily before breakfast.  She reports compliance to this medication, however, she does admit to taking it with the rest of her medications.   I reviewed patient's thyroid tests:  Lab Results  Component Value Date   TSH 6.440 (H) 04/06/2021   TSH 6.08 (A) 01/26/2021   TSH 5.174 (H) 12/16/2020   TSH 16.122 (H) 12/15/2020   TSH 9.193 (H) 12/03/2020   TSH 9.938 (H) 11/09/2020   TSH 7.243 (H) 08/30/2020   TSH 5.792 (H) 06/14/2020  TSH 1.536 05/19/2016   TSH 10.500 (H) 05/09/2016   FREET4 1.11 04/06/2021   FREET4 1.08 12/16/2020   FREET4 1.14 (H) 12/15/2020   FREET4 0.98 12/03/2020   FREET4 0.80 11/09/2020   FREET4 0.66 06/15/2020   FREET4 0.72 05/09/2016     Patient denies any definitive symptoms associated with hypothyroidism aside from dry skin and occasional feelings of a lump in her throat or difficulty swallowing.  Pt denies feeling nodules in neck, hoarseness, odynophagia, SOB with lying down.  she denies family history of thyroid disorders.  No family history of thyroid cancer.  No history of radiation therapy to head or neck.  No recent use of iodine supplements.  Denies use of Biotin containing supplements.  She has never had any imaging of her thyroid in the past.  I reviewed her chart and she also has a history of Amiodarone use a few years back and is an ESRD patient on dialysis.   ROS:  Constitutional: + weight gain, no fatigue, no subjective hyperthermia, no subjective hypothermia Eyes: no blurry vision, no xerophthalmia ENT: no sore throat, no nodules palpated in throat, intermittent dysphagia, no odynophagia, no hoarseness Cardiovascular: no chest pain, no SOB, no palpitations, no leg  swelling Respiratory: no cough, no SOB Gastrointestinal: no nausea/vomiting/diarrhea Musculoskeletal: no muscle/joint aches, walks with cane Skin: no rashes, + dry skin Neurological: no tremors, no numbness, no tingling, no dizziness Psychiatric: no depression, no anxiety   Objective:   Objective     BP 102/64   Pulse (!) 103   Ht 5\' 2"  (1.575 m)   Wt 125 lb (56.7 kg)   BMI 22.86 kg/m  Wt Readings from Last 3 Encounters:  04/08/21 125 lb (56.7 kg)  03/15/21 104 lb 15 oz (47.6 kg)  03/12/21 104 lb 15 oz (47.6 kg)    BP Readings from Last 3 Encounters:  04/08/21 102/64  03/16/21 (!) 175/56  03/12/21 (!) 161/49     Physical Exam- Limited  Constitutional:  Body mass index is 22.31 kg/m. , not in acute distress, normal state of mind Eyes:  EOMI, no exophthalmos Neck: Supple Cardiovascular: RRR, + murmurs, rubs, or gallops, no edema Respiratory: Adequate breathing efforts, no crackles, rales, rhonchi, or wheezing Musculoskeletal: no gross deformities, strength intact in all four extremities, no gross restriction of joint movements, walks with cane Skin:  no rashes, no hyperemia Neurological: no tremor with outstretched hands    CMP ( most recent) CMP     Component Value Date/Time   NA 136 03/15/2021 0129   K 4.1 03/15/2021 0129   CL 98 03/15/2021 0129   CO2 27 03/15/2021 0129   GLUCOSE 104 (H) 03/15/2021 0129   BUN 37 (H) 03/15/2021 0129   CREATININE 7.76 (H) 03/15/2021 0129   CREATININE 6.57 (H) 03/05/2019 1159   CALCIUM 8.9 03/15/2021 0129   CALCIUM 8.1 (L) 05/29/2013 1814   PROT 6.0 (L) 02/27/2021 0857   ALBUMIN 2.8 (L) 02/27/2021 0857   AST 12 (L) 02/27/2021 0857   ALT 12 02/27/2021 0857   ALKPHOS 63 02/27/2021 0857   BILITOT 0.3 02/27/2021 0857   GFRNONAA 5 (L) 03/15/2021 0129   GFRNONAA 6 (L) 03/05/2019 1159   GFRAA 9 (L) 02/12/2020 1712   GFRAA 6 (L) 03/05/2019 1159     Diabetic Labs (most recent): Lab Results  Component Value Date   HGBA1C  4.3 (L) 11/09/2020   HGBA1C 4.8 06/12/2012     Lipid Panel ( most recent) Lipid Panel  Component Value Date/Time   CHOL 123 11/10/2020 0933   TRIG 87 11/10/2020 0933   HDL 58 11/10/2020 0933   CHOLHDL 2.1 11/10/2020 0933   VLDL 17 11/10/2020 0933   LDLCALC 48 11/10/2020 0933       Lab Results  Component Value Date   TSH 6.440 (H) 04/06/2021   TSH 6.08 (A) 01/26/2021   TSH 5.174 (H) 12/16/2020   TSH 16.122 (H) 12/15/2020   TSH 9.193 (H) 12/03/2020   TSH 9.938 (H) 11/09/2020   TSH 7.243 (H) 08/30/2020   TSH 5.792 (H) 06/14/2020   TSH 1.536 05/19/2016   TSH 10.500 (H) 05/09/2016   FREET4 1.11 04/06/2021   FREET4 1.08 12/16/2020   FREET4 1.14 (H) 12/15/2020   FREET4 0.98 12/03/2020   FREET4 0.80 11/09/2020   FREET4 0.66 06/15/2020   FREET4 0.72 05/09/2016     Thyroid Ultrasound from 01/21/21 CLINICAL DATA:  Hypothyroidism   EXAM: THYROID ULTRASOUND   TECHNIQUE: Ultrasound examination of the thyroid gland and adjacent soft tissues was performed.   COMPARISON:  None.   FINDINGS: Parenchymal Echotexture: Mildly heterogeneous   Isthmus: 0.3 cm   Right lobe: 4.5 x 2.0 x 1.6 cm   Left lobe: 3.9 x 1.8 x 1.7 cm   _________________________________________________________   Estimated total number of nodules >/= 1 cm: 1   Number of spongiform nodules >/=  2 cm not described below (TR1): 0   Number of mixed cystic and solid nodules >/= 1.5 cm not described below (TR2): 0   _________________________________________________________   1.2 x 1.0 x 0.5 cm spongiform nodule in the inferior left thyroid lobe does not meet criteria for FNA or imaging surveillance.   IMPRESSION: No suspicious thyroid nodules.   The above is in keeping with the ACR TI-RADS recommendations - J Am Coll Radiol 2017;14:587-595.     Electronically Signed   By: Miachel Roux M.D.   On: 01/21/2021 13:44  Results for ASIANNA, BRUNDAGE (MRN [id number]) as of 02/02/2021 15:05  Ref. Range  12/03/2020 02:01 12/03/2020 10:18 12/15/2020 01:57 12/16/2020 14:09 01/26/2021 00:00  TSH Latest Ref Range: 0.41 - 5.90  9.193 (H)  16.122 (H) 5.174 (H) 6.08 (A)  Triiodothyronine (T3) Latest Ref Range: 71 - 180 ng/dL  129 249 (H) 101   T4,Free(Direct) Latest Ref Range: 0.61 - 1.12 ng/dL  0.98 1.14 (H) 1.08     Assessment & Plan:   ASSESSMENT / PLAN:  1. Hypothyroidism-unspecified (amiodarone induced?)  Patient has newly diagnosed hypothyroidism.  Antibody testing was negative, ruling out autoimmune thyroid dysfunction as potential cause.    -She is advised to continue her current dose of Levothyroxine 25 mcg po daily before breakfast but is encouraged not to take it with her other medications.  - We discussed about correct intake of levothyroxine, at fasting, with water, separated by at least 30 minutes from breakfast, and separated by more than 4 hours from calcium, iron, multivitamins, acid reflux medications (PPIs). -Patient is made aware of the fact that thyroid hormone replacement is needed for life, dose to be adjusted by periodic monitoring of thyroid function tests.  - I had ordered more comprehensive labs to be done prior to the visit, but only the TSH was performed.  Will check more comprehensive thyroid tests before next visit: TSH, free T4, FT3 and antibody testing for autoimmune thyroid dysfunction.  -Her thyroid ultrasound did show mild heterogeneous thyroid gland with small nodule which does not meet criteria for biopsy or dedicated follow up.  I spent 25 minutes in the care of the patient today including review of labs from Thyroid Function, CMP, and other relevant labs ; imaging/biopsy records (current and previous including abstractions from other facilities); face-to-face time discussing  her lab results and symptoms, medications doses, her options of short and long term treatment based on the latest standards of care / guidelines;   and documenting the  encounter.  [name]  participated in the discussions, expressed understanding, and voiced agreement with the above plans.  All questions were answered to her satisfaction. she is encouraged to contact clinic should she have any questions or concerns prior to her return visit.   FOLLOW UP PLAN:  Return in about 4 months (around 08/08/2021) for Thyroid follow up, Previsit labs.  Rayetta Pigg, [facility] [address] [place], [place] [address] Phone: [phone] Fax: [phone]  04/08/2021, 3:07 PM

## 2021-04-09 ENCOUNTER — Encounter (HOSPITAL_COMMUNITY): Payer: Self-pay

## 2021-04-09 ENCOUNTER — Observation Stay (HOSPITAL_COMMUNITY)
Admission: EM | Admit: 2021-04-09 | Discharge: 2021-04-10 | Disposition: A | Discharge status: Home / Self Care | Payer: Medicare HMO | Attending: Internal Medicine | Admitting: Internal Medicine

## 2021-04-09 ENCOUNTER — Other Ambulatory Visit: Payer: Self-pay

## 2021-04-09 ENCOUNTER — Emergency Department (HOSPITAL_COMMUNITY): Payer: Medicare HMO

## 2021-04-09 DIAGNOSIS — I4891 Unspecified atrial fibrillation: Secondary | ICD-10-CM | POA: Diagnosis not present

## 2021-04-09 DIAGNOSIS — Z992 Dependence on renal dialysis: Secondary | ICD-10-CM | POA: Diagnosis not present

## 2021-04-09 DIAGNOSIS — R778 Other specified abnormalities of plasma proteins: Secondary | ICD-10-CM | POA: Insufficient documentation

## 2021-04-09 DIAGNOSIS — I2510 Atherosclerotic heart disease of native coronary artery without angina pectoris: Secondary | ICD-10-CM | POA: Insufficient documentation

## 2021-04-09 DIAGNOSIS — Z8673 Personal history of transient ischemic attack (TIA), and cerebral infarction without residual deficits: Secondary | ICD-10-CM | POA: Insufficient documentation

## 2021-04-09 DIAGNOSIS — Z7982 Long term (current) use of aspirin: Secondary | ICD-10-CM | POA: Diagnosis not present

## 2021-04-09 DIAGNOSIS — Z8616 Personal history of COVID-19: Secondary | ICD-10-CM | POA: Insufficient documentation

## 2021-04-09 DIAGNOSIS — M79605 Pain in left leg: Secondary | ICD-10-CM | POA: Diagnosis present

## 2021-04-09 DIAGNOSIS — Z20822 Contact with and (suspected) exposure to covid-19: Secondary | ICD-10-CM | POA: Insufficient documentation

## 2021-04-09 DIAGNOSIS — D649 Anemia, unspecified: Secondary | ICD-10-CM | POA: Diagnosis not present

## 2021-04-09 DIAGNOSIS — N186 End stage renal disease: Secondary | ICD-10-CM | POA: Insufficient documentation

## 2021-04-09 DIAGNOSIS — E039 Hypothyroidism, unspecified: Secondary | ICD-10-CM | POA: Diagnosis not present

## 2021-04-09 DIAGNOSIS — Z955 Presence of coronary angioplasty implant and graft: Secondary | ICD-10-CM | POA: Diagnosis not present

## 2021-04-09 DIAGNOSIS — I5033 Acute on chronic diastolic (congestive) heart failure: Secondary | ICD-10-CM | POA: Diagnosis not present

## 2021-04-09 DIAGNOSIS — E119 Type 2 diabetes mellitus without complications: Secondary | ICD-10-CM | POA: Insufficient documentation

## 2021-04-09 DIAGNOSIS — Z951 Presence of aortocoronary bypass graft: Secondary | ICD-10-CM | POA: Diagnosis not present

## 2021-04-09 DIAGNOSIS — Z79899 Other long term (current) drug therapy: Secondary | ICD-10-CM | POA: Insufficient documentation

## 2021-04-09 DIAGNOSIS — I132 Hypertensive heart and chronic kidney disease with heart failure and with stage 5 chronic kidney disease, or end stage renal disease: Secondary | ICD-10-CM | POA: Diagnosis not present

## 2021-04-09 LAB — BASIC METABOLIC PANEL
Anion gap: 10 (ref 5–15)
BUN: 39 mg/dL — ABNORMAL HIGH (ref 8–23)
CO2: 28 mmol/L (ref 22–32)
Calcium: 9.2 mg/dL (ref 8.9–10.3)
Chloride: 100 mmol/L (ref 98–111)
Creatinine, Ser: 7.34 mg/dL — ABNORMAL HIGH (ref 0.44–1.00)
GFR, Estimated: 5 mL/min — ABNORMAL LOW (ref >60)
Glucose, Bld: 141 mg/dL — ABNORMAL HIGH (ref 70–99)
Potassium: 4 mmol/L (ref 3.5–5.1)
Sodium: 138 mmol/L (ref 135–145)

## 2021-04-09 LAB — PREPARE RBC (CROSSMATCH)

## 2021-04-09 LAB — CBC
HCT: 22.4 % — ABNORMAL LOW (ref 36.0–46.0)
Hemoglobin: 6.9 g/dL — CL (ref 12.0–15.0)
MCH: 37.5 pg — ABNORMAL HIGH (ref 26.0–34.0)
MCHC: 30.8 g/dL (ref 30.0–36.0)
MCV: 121.7 fL — ABNORMAL HIGH (ref 80.0–100.0)
Platelets: 305 10*3/uL (ref 150–400)
RBC: 1.84 MIL/uL — ABNORMAL LOW (ref 3.87–5.11)
RDW: 22.9 % — ABNORMAL HIGH (ref 11.5–15.5)
WBC: 6.9 10*3/uL (ref 4.0–10.5)
nRBC: 0 % (ref 0.0–0.2)

## 2021-04-09 LAB — RESP PANEL BY RT-PCR (FLU A&B, COVID) ARPGX2
Influenza A by PCR: NEGATIVE
Influenza B by PCR: NEGATIVE
SARS Coronavirus 2 by RT PCR: NEGATIVE

## 2021-04-09 LAB — MAGNESIUM: Magnesium: 1.7 mg/dL (ref 1.7–2.4)

## 2021-04-09 LAB — HEPATITIS B SURFACE ANTIGEN: Hepatitis B Surface Ag: NONREACTIVE

## 2021-04-09 LAB — TROPONIN I (HIGH SENSITIVITY)
Troponin I (High Sensitivity): 156 ng/L (ref <18)
Troponin I (High Sensitivity): 167 ng/L (ref <18)

## 2021-04-09 LAB — TSH: TSH: 4.509 u[IU]/mL — ABNORMAL HIGH (ref 0.350–4.500)

## 2021-04-09 LAB — PHOSPHORUS: Phosphorus: 2.2 mg/dL — ABNORMAL LOW (ref 2.5–4.6)

## 2021-04-09 IMAGING — DX DG CHEST 1V PORT
1 series · 1 of 1 positions shown · non-contrast
Comparison: [DATE].

CLINICAL DATA: sob

EXAM:
PORTABLE CHEST 1 VIEW

[chest ap]
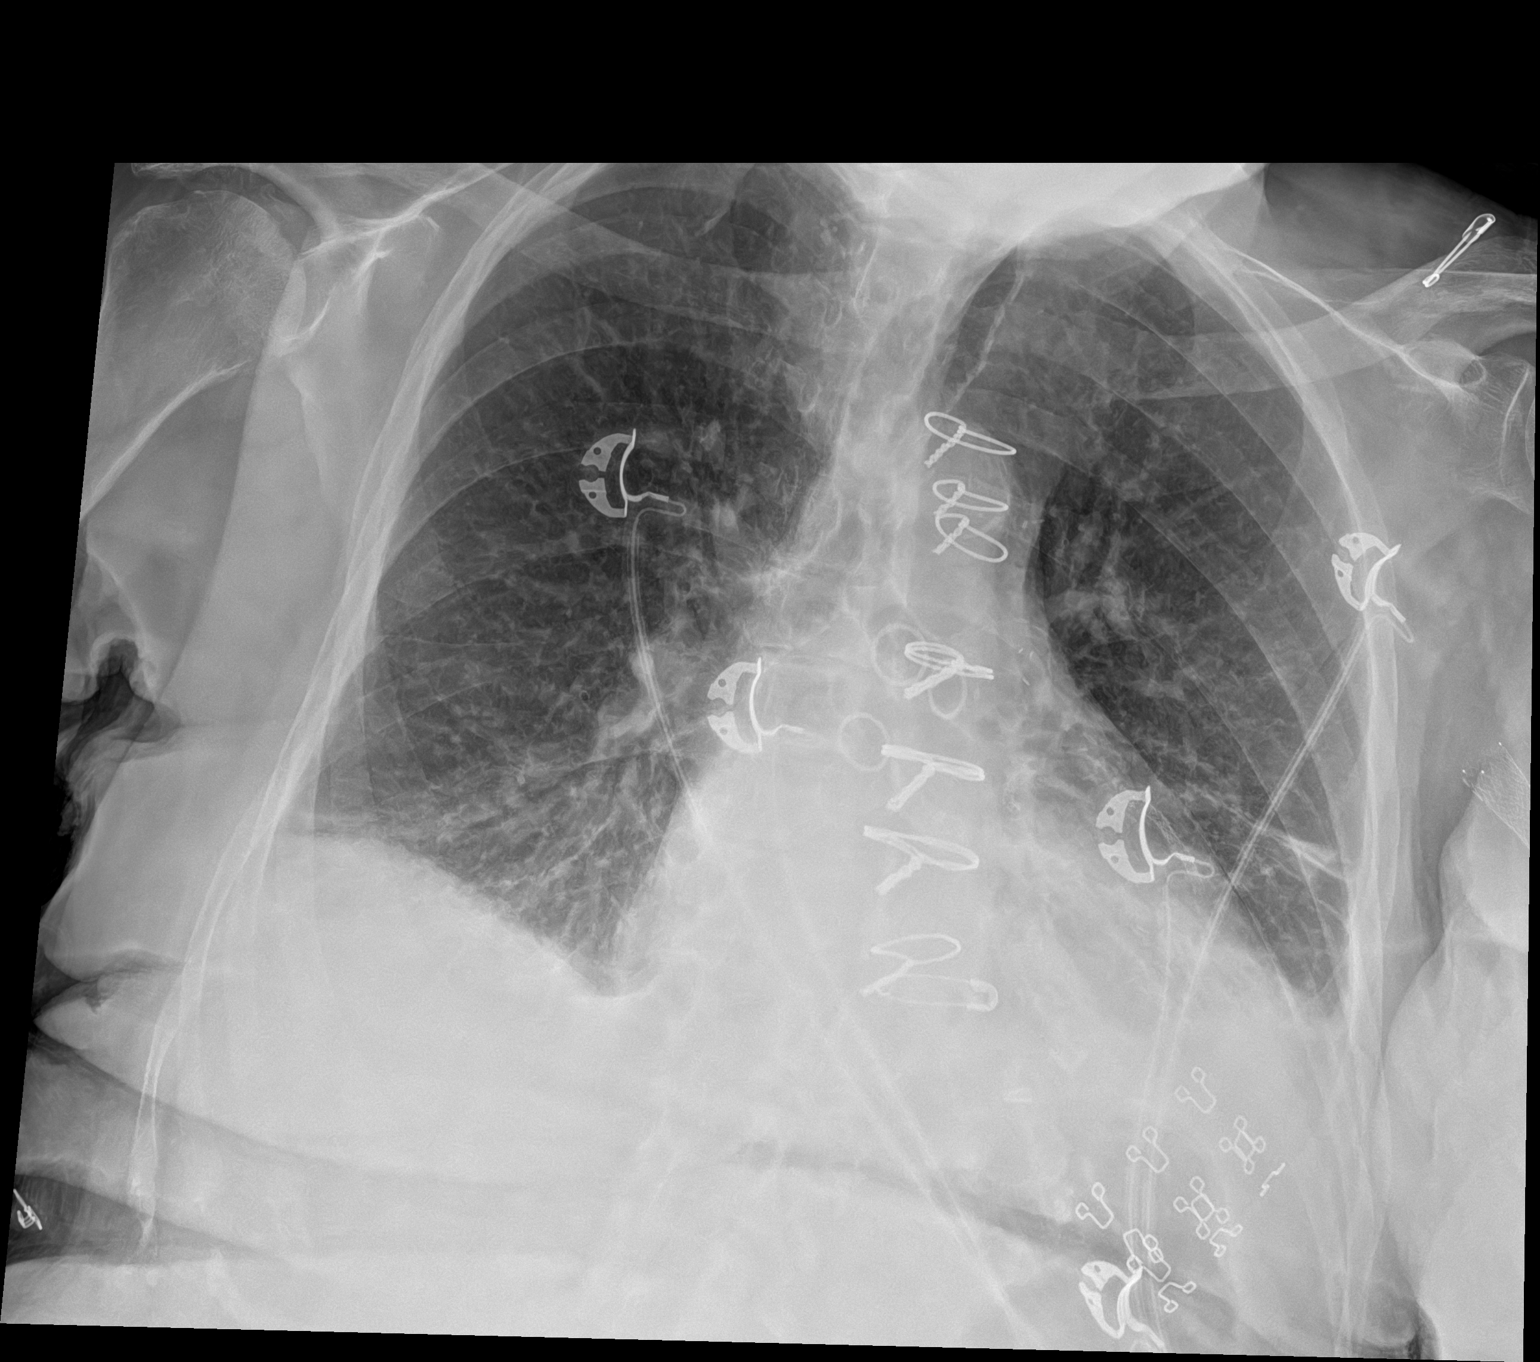

[1 of 1 positions shown; findings below may reference images not displayed]

FINDINGS: Similar left greater than right basilar opacities. Probable small
bilateral pleural effusions. No visible pneumothorax on this semi
erect radiograph. Similar mild enlargement the cardiac silhouette.
Median sternotomy and CABG. Similar pulmonary vascular congestion.
IMPRESSION: 1. Similar probable small bilateral pleural effusions with overlying
left greater than right basilar atelectasis and/or consolidation.
2. Similar cardiomegaly and pulmonary vascular congestion.

## 2021-04-09 IMAGING — US US EXTREM LOW VENOUS*L*
1 series · 13 of 24 positions shown · non-contrast
Comparison: None.

CLINICAL DATA: Shortness of breath. Left lower extremity pain and
swelling.

EXAM:
LEFT LOWER EXTREMITY VENOUS DOPPLER ULTRASOUND
TECHNIQUE: Gray-scale sonography with graded compression, as well as color
Doppler and duplex ultrasound were performed to evaluate the lower
extremity deep venous systems from the level of the common femoral
vein and including the common femoral, femoral, profunda femoral,
popliteal and calf veins including the posterior tibial, peroneal
and gastrocnemius veins when visible. The superficial great
saphenous vein was also interrogated. Spectral Doppler was utilized
to evaluate flow at rest and with distal augmentation maneuvers in
the common femoral, femoral and popliteal veins.

[Series 1: us venous img lower uni left (dvt) · portal-venous · 13 of 60 slices shown]
[im 1/60]
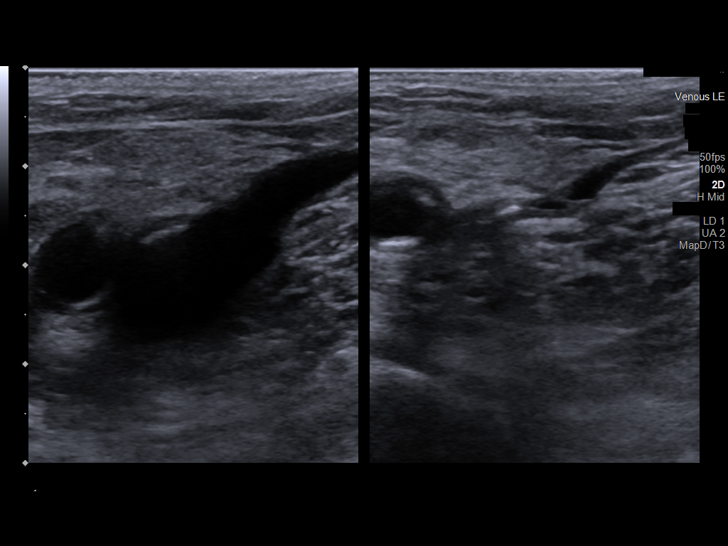
[im 6/60]
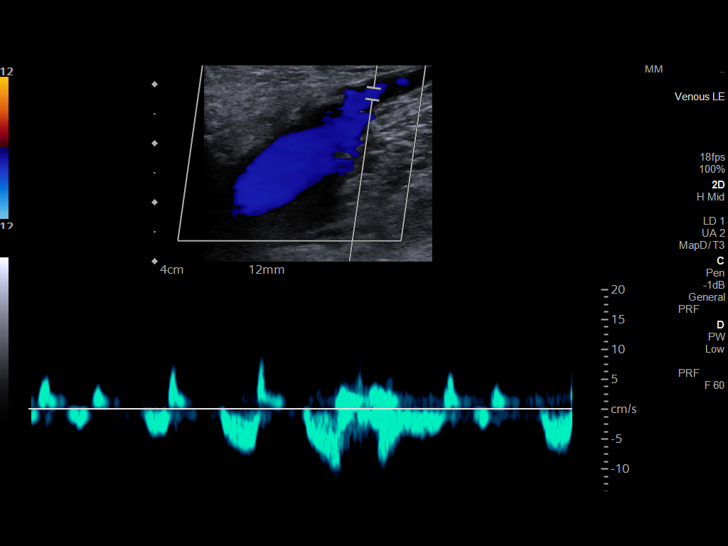
[im 11/60]
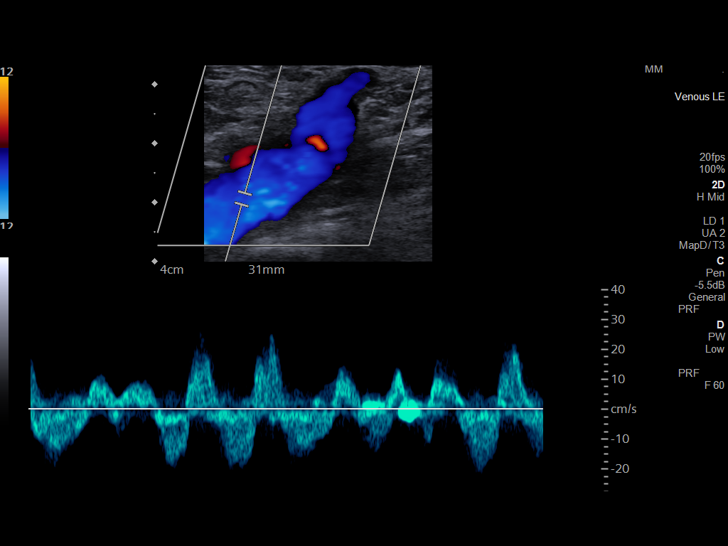
[im 16/60]
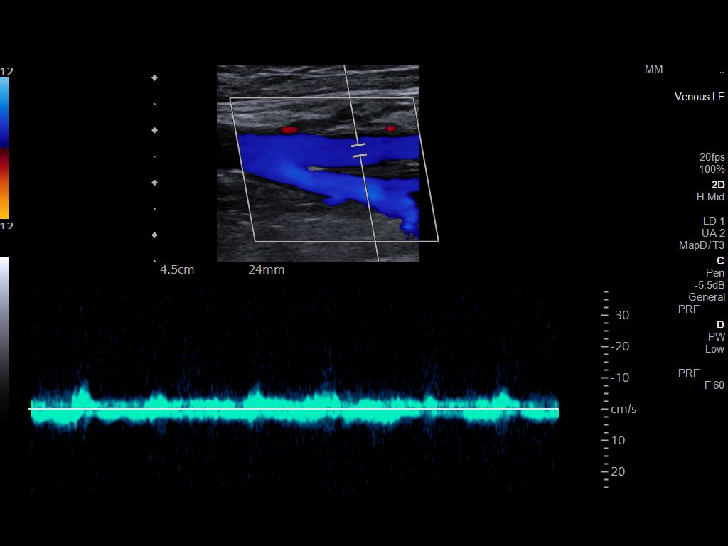
[im 21/60]
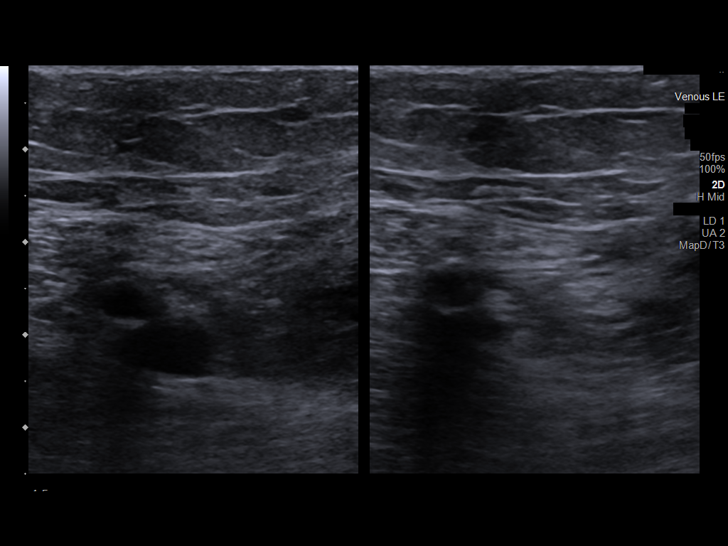
[im 26/60]
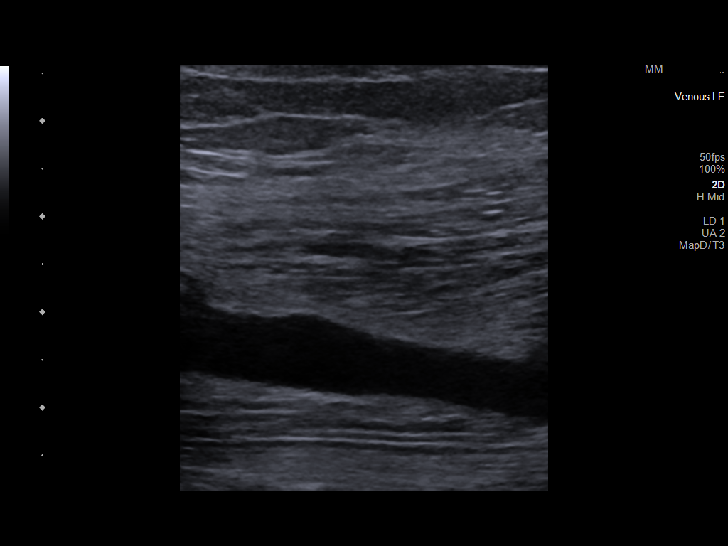
[im 31/60]
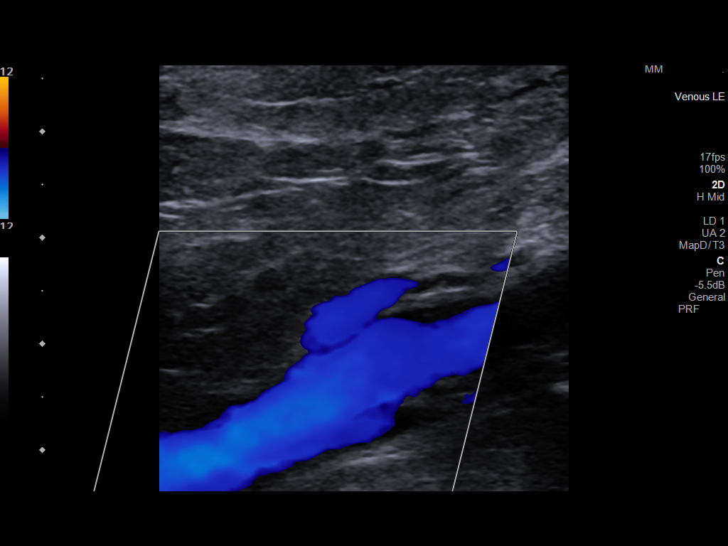
[im 34/60]
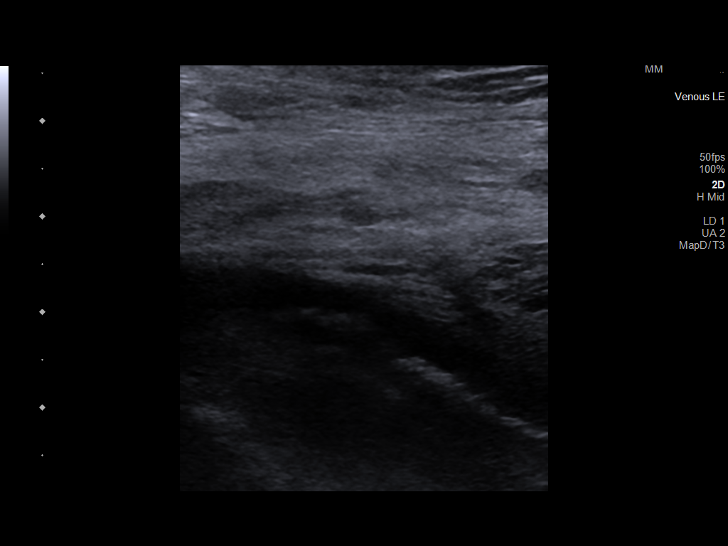
[im 39/60]
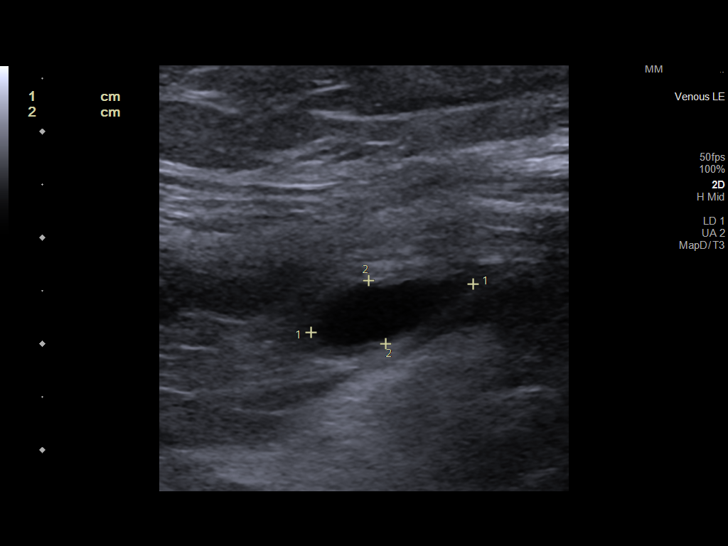
[im 44/60]
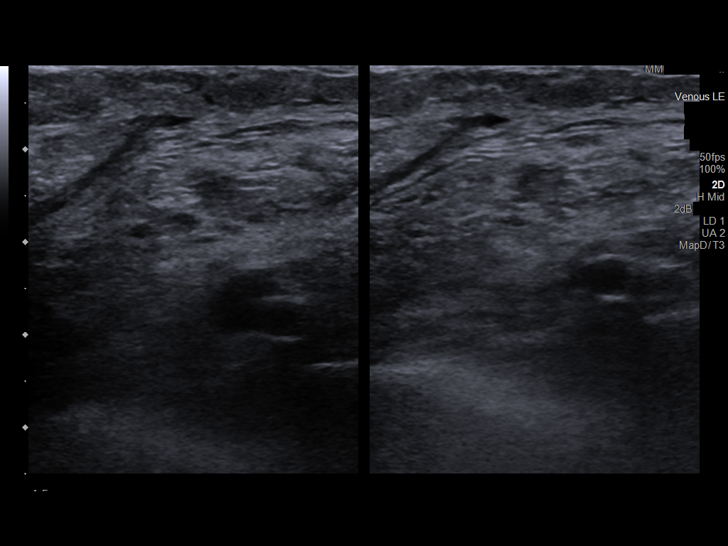
[im 49/60]
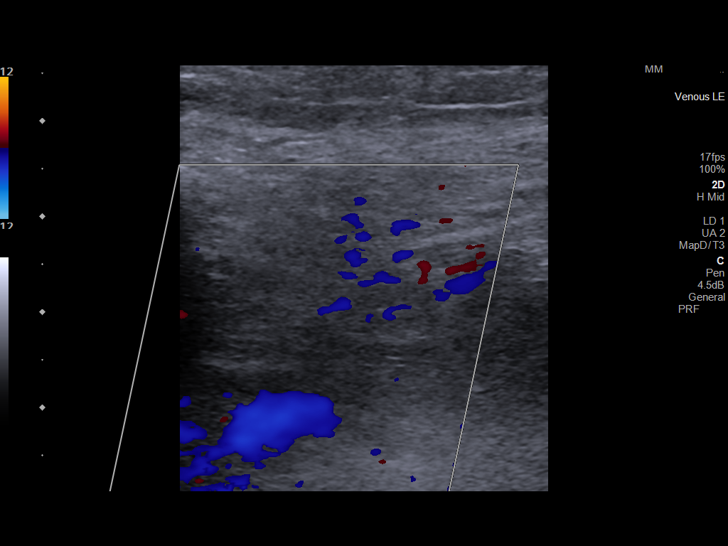
[im 54/60]
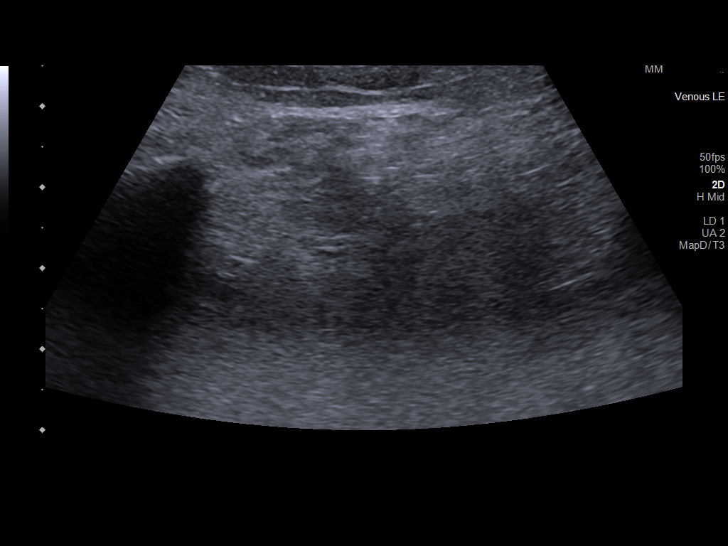
[im 60/60]
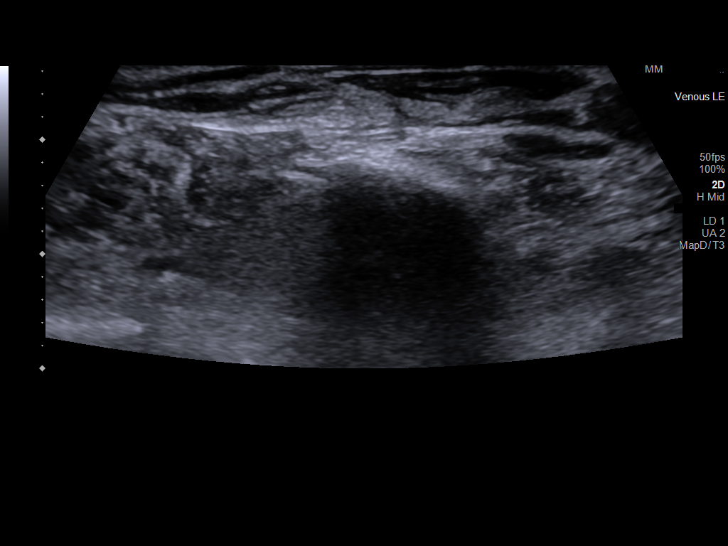

[13 of 24 positions shown; findings below may reference images not displayed]

FINDINGS: Contralateral Common Femoral Vein: Respiratory phasicity is normal
and symmetric with the symptomatic side. No evidence of thrombus.
Normal compressibility.

Common Femoral Vein: No evidence of thrombus. Normal
compressibility, respiratory phasicity and response to augmentation.

Saphenofemoral Junction: No evidence of thrombus. Normal
compressibility and flow on color Doppler imaging.

Profunda Femoral Vein: No evidence of thrombus. Normal
compressibility and flow on color Doppler imaging.

Femoral Vein: No evidence of thrombus. Normal compressibility,
respiratory phasicity and response to augmentation.

Popliteal Vein: No evidence of thrombus. Normal compressibility,
respiratory phasicity and response to augmentation.

Calf Veins: Visualized left deep calf veins are patent without
thrombus.

Superficial Great Saphenous Vein: No evidence of thrombus. Normal
compressibility.

Other Findings: Small fluid collection in the left popliteal fossa
that measures 1.8 x 0.6 x 1.6 cm. Normal compressibility in the
visualized left short saphenous vein. Subcutaneous edema in the left
ankle and calf.
IMPRESSION: 1.  Negative for deep venous thrombosis in left lower extremity.
2. Small left knee Baker's cyst.

## 2021-04-09 MED ORDER — MIDODRINE HCL 5 MG PO TABS: 5.0000 mg | ORAL_TABLET | ORAL | Status: DC

## 2021-04-09 MED ORDER — ONDANSETRON HCL 4 MG PO TABS: 4.0000 mg | ORAL_TABLET | Freq: Four times a day (QID) | ORAL | Status: DC | PRN

## 2021-04-09 MED ORDER — SODIUM CHLORIDE 0.9 % IV SOLN
10.0000 mL/h | Freq: Once | INTRAVENOUS | Status: AC
  Administered 2021-04-09: 10 mL/h via INTRAVENOUS

## 2021-04-09 MED ORDER — LIDOCAINE-PRILOCAINE 2.5-2.5 % EX CREA: 1.0000 "application " | TOPICAL_CREAM | CUTANEOUS | Status: DC

## 2021-04-09 MED ORDER — DILTIAZEM HCL-DEXTROSE 125-5 MG/125ML-% IV SOLN (PREMIX)
5.0000 mg/h | INTRAVENOUS | Status: DC
  Administered 2021-04-09: 5 mg/h via INTRAVENOUS
  Filled 2021-04-09: qty 125

## 2021-04-09 MED ORDER — SEVELAMER CARBONATE 800 MG PO TABS
1600.0000 mg | ORAL_TABLET | Freq: Three times a day (TID) | ORAL | Status: DC
  Administered 2021-04-10: 1600 mg via ORAL
  Filled 2021-04-09: qty 2

## 2021-04-09 MED ORDER — ACETAMINOPHEN 650 MG RE SUPP: 650.0000 mg | Freq: Four times a day (QID) | RECTAL | Status: DC | PRN

## 2021-04-09 MED ORDER — HYDROXYZINE HCL 25 MG PO TABS: 100.0000 mg | ORAL_TABLET | Freq: Every day | ORAL | Status: DC | PRN

## 2021-04-09 MED ORDER — DILTIAZEM LOAD VIA INFUSION
20.0000 mg | Freq: Once | INTRAVENOUS | Status: AC
  Administered 2021-04-09: 20 mg via INTRAVENOUS
  Filled 2021-04-09: qty 20

## 2021-04-09 MED ORDER — PANTOPRAZOLE SODIUM 40 MG PO TBEC
40.0000 mg | DELAYED_RELEASE_TABLET | Freq: Every day | ORAL | Status: DC
  Administered 2021-04-10: 40 mg via ORAL
  Filled 2021-04-09: qty 1

## 2021-04-09 MED ORDER — CHLORHEXIDINE GLUCONATE CLOTH 2 % EX PADS
6.0000 | MEDICATED_PAD | Freq: Every day | CUTANEOUS | Status: DC
  Administered 2021-04-10: 6 via TOPICAL

## 2021-04-09 MED ORDER — PENTAFLUOROPROP-TETRAFLUOROETH EX AERO: 1.0000 "application " | INHALATION_SPRAY | CUTANEOUS | Status: DC | PRN

## 2021-04-09 MED ORDER — ISOSORBIDE MONONITRATE ER 60 MG PO TB24
60.0000 mg | ORAL_TABLET | Freq: Two times a day (BID) | ORAL | Status: DC
  Administered 2021-04-09 – 2021-04-10 (×2): 60 mg via ORAL
  Filled 2021-04-09 (×2): qty 1

## 2021-04-09 MED ORDER — ALPRAZOLAM 0.5 MG PO TABS
0.5000 mg | ORAL_TABLET | Freq: Two times a day (BID) | ORAL | Status: DC | PRN
  Administered 2021-04-09: 0.5 mg via ORAL
  Filled 2021-04-09: qty 1

## 2021-04-09 MED ORDER — SODIUM CHLORIDE 0.9% IV SOLUTION: Freq: Once | INTRAVENOUS | Status: DC

## 2021-04-09 MED ORDER — FLUTICASONE PROPIONATE 50 MCG/ACT NA SUSP: 1.0000 | Freq: Every evening | NASAL | Status: DC | PRN

## 2021-04-09 MED ORDER — ATORVASTATIN CALCIUM 10 MG PO TABS
10.0000 mg | ORAL_TABLET | Freq: Every day | ORAL | Status: DC
  Administered 2021-04-10: 10 mg via ORAL
  Filled 2021-04-09: qty 1

## 2021-04-09 MED ORDER — METOPROLOL TARTRATE 25 MG PO TABS
12.5000 mg | ORAL_TABLET | Freq: Two times a day (BID) | ORAL | Status: DC
  Administered 2021-04-09 – 2021-04-10 (×2): 12.5 mg via ORAL
  Filled 2021-04-09 (×2): qty 1

## 2021-04-09 MED ORDER — SODIUM CHLORIDE 0.9 % IV SOLN: 100.0000 mL | INTRAVENOUS | Status: DC | PRN

## 2021-04-09 MED ORDER — LIDOCAINE HCL (PF) 1 % IJ SOLN: 5.0000 mL | INTRAMUSCULAR | Status: DC | PRN

## 2021-04-09 MED ORDER — ACETAMINOPHEN 325 MG PO TABS
650.0000 mg | ORAL_TABLET | Freq: Four times a day (QID) | ORAL | Status: DC | PRN
  Filled 2021-04-09: qty 2

## 2021-04-09 MED ORDER — LEVOTHYROXINE SODIUM 25 MCG PO TABS
25.0000 ug | ORAL_TABLET | Freq: Every day | ORAL | Status: DC
  Administered 2021-04-10: 25 ug via ORAL
  Filled 2021-04-09: qty 1

## 2021-04-09 MED ORDER — METOPROLOL TARTRATE 5 MG/5ML IV SOLN
5.0000 mg | Freq: Once | INTRAVENOUS | Status: AC
  Administered 2021-04-09: 5 mg via INTRAVENOUS
  Filled 2021-04-09: qty 5

## 2021-04-09 MED ORDER — ONDANSETRON HCL 4 MG/2ML IJ SOLN: 4.0000 mg | Freq: Four times a day (QID) | INTRAMUSCULAR | Status: DC | PRN

## 2021-04-09 MED ORDER — LIDOCAINE-PRILOCAINE 2.5-2.5 % EX CREA: 1.0000 "application " | TOPICAL_CREAM | CUTANEOUS | Status: DC | PRN

## 2021-04-09 MED ORDER — RENA-VITE PO TABS
1.0000 | ORAL_TABLET | Freq: Every day | ORAL | Status: DC
  Administered 2021-04-10: 1 via ORAL
  Filled 2021-04-09: qty 1

## 2021-04-09 NOTE — ED Notes (Signed)
Spoke with blood bank and they have made me aware that her blood had positive antibody so they are having to get the blood from another location. They will notify this nurse when it has arrived.

## 2021-04-09 NOTE — ED Notes (Signed)
At bedside with patient while bolus running in.  Patient grabbed her chest and stated she didn't feel good.  Heart rate dropped to 34.  Stopped infusion immediately and notified provider and at bedside . Crash cart at bedside.  Patient is conscious at this time.  Heart rate is 54.

## 2021-04-09 NOTE — ED Triage Notes (Signed)
Pt stated she took her bp at home and it was 90 over something. Her left leg hurts from a fall from 3 weeks ago. Pt stated she did not go to dialysis today because she was unable to walk. Pt walked yesterday. Pt took dialysis last on Weds.

## 2021-04-09 NOTE — H&P (Signed)
History and Physical    Shawna [name]:[id number] DOB: [birth year] DOA: 04/09/2021  PCP: [facility]   Patient coming from: Home  I have personally briefly reviewed patient's old medical records in [place]  Chief Complaint: Generalized weakness and leg pain.  HPI: Shawna [name] is a 80 y.o. female with medical history significant of ESRD, hypertension, chronic anemia, history of AVN of the colon, chronic diastolic heart failure, gastroesophageal reflux disease, hyperlipidemia and paroxysmal atrial fibrillation (not on chronic anticoagulation); who presented to the emergency department secondary to left lower extremity pain and generalized weakness.  Patient was in the process to go to her dialysis as she normally would do (MWF), when she felt significantly weak and noticing worsening left leg pain.  Pain is not radiated, constant and 6 out of 10 in intensity.  Patient denies chest pain, shortness of breath, nausea, vomiting, melena, hematochezia, hematemesis, abdominal pain, or any other complaints.  Patient is vaccinated against COVID but not posted.  ED Course: Patient was found to be significantly anemic with a hemoglobin of 6.9 and also with mild elevated troponin.  No acute ischemic changes seen on EKG and no acute cardiopulmonary process.  1 unit of PRBC has been ordered and nephrology service was consulted for dialysis.  Review of Systems: As per HPI otherwise all other systems reviewed and are negative.  Past Medical History:  Diagnosis Date   Acute on chronic respiratory failure with hypoxia ([place]) 10/10/2016   Anxiety    Arthritis    AVM (arteriovenous malformation) of colon    CAD (coronary artery disease)    a. s/p CABG in 2013 b. DES to D1 in 10/2016. c. cath in 07/2018 showing patent grafts with occlusion of D1 at prior stent site and progression of PDA disease --> medical management recommended   Carotid artery disease ([place])    a. 83-38% LICA, 08/5051     Chronic anemia    Chronic bronchitis (HCC)    Chronic diastolic CHF (congestive heart failure) ([place])    a. 02/2012 Echo EF 60-65%, nl wall motion, Gr 1 DD, mod MR   Colon cancer ([place]) 1992   Esophageal stricture    ESRD on hemodialysis ([place])    ESRD due to HTN, started dialysis 2011 and gets HD at [facility] with Dr Hinda Lenis on MWF schedule.  Access is LUA AVF as of Sept 2014.    GERD (gastroesophageal reflux disease)    High cholesterol 12/2011   History of blood transfusion 07/2011; 12/2011; 01/2012 X 2; 04/2012   History of gout    History of lower GI bleeding    Hypertension    Iron deficiency anemia    Jugular vein occlusion, right (HCC)    Mitral regurgitation    a. Moderate by echo, 02/2012   Myocardial infarction [facility])    NSVT (nonsustained ventricular tachycardia) ([place])    Ovarian cancer ([place]) 1992   PAF (paroxysmal atrial fibrillation) ([place])    Pneumonia ~ 2009   PUD (peptic ulcer disease)    TIA (transient ischemic attack)     Past Surgical History:  Procedure Laterality Date   A/V SHUNTOGRAM Left 03/19/2019   Procedure: A/V SHUNTOGRAM;  Surgeon: Katha Cabal, MD;  Location: [place] CV LAB;  Service: Cardiovascular;  Laterality: Left;   ABDOMINAL HYSTERECTOMY  1992   APPENDECTOMY  06/1990   AV FISTULA PLACEMENT  07/2009   left upper arm   AV FISTULA PLACEMENT Right 09/06/2016  Procedure: RIGHT FOREARM ARTERIOVENOUS (AV) GRAFT;  Surgeon: Elam Dutch, MD;  Location: [facility] OR;  Service: Vascular;  Laterality: Right;   AV FISTULA PLACEMENT N/A 02/24/2017   Procedure: INSERTION OF ARTERIOVENOUS (AV) GORE-TEX GRAFT ARM (BRACHIAL AXILLARY);  Surgeon: Katha Cabal, MD;  Location: [facility] ORS;  Service: Vascular;  Laterality: N/A;   [place] Right 09/06/2016   Procedure: REMOVAL OF Right Arm ARTERIOVENOUS GORETEX GRAFT and Vein Patch angioplasty of brachial artery;  Surgeon: Angelia Mould, MD;  Location: [place];  Service: Vascular;  Laterality: Right;    BIOPSY  09/26/2019   Procedure: BIOPSY;  Surgeon: Rogene Houston, MD;  [facility];  Service: Endoscopy;;   COLON RESECTION  1992   COLON SURGERY     COLONOSCOPY N/A 03/09/2019   Procedure: COLONOSCOPY;  Surgeon: Rogene Houston, MD;  [facility];  Service: Endoscopy;  Laterality: N/A;   CORONARY ANGIOPLASTY WITH STENT PLACEMENT  12/15/11   "2"   CORONARY ANGIOPLASTY WITH STENT PLACEMENT  y/2013   "1; makes total of 3" (05/02/2012)   CORONARY ARTERY BYPASS GRAFT  06/13/2012   Procedure: CORONARY ARTERY BYPASS GRAFTING (CABG);  Surgeon: Grace Isaac, MD;  Location: [place];  Service: Open Heart Surgery;  Laterality: N/A;  cabg x four;  using left internal mammary artery, and left leg greater saphenous vein harvested endoscopically   CORONARY STENT INTERVENTION N/A 10/13/2016   Procedure: Coronary Stent Intervention;  Surgeon: Troy Sine, MD;  Location: [place] CV LAB;  Service: Cardiovascular;  Laterality: N/A;   DIALYSIS/PERMA CATHETER REMOVAL N/A 04/18/2017   Procedure: DIALYSIS/PERMA CATHETER REMOVAL;  Surgeon: Katha Cabal, MD;  Location: [place] CV LAB;  Service: Cardiovascular;  Laterality: N/A;   DILATION AND CURETTAGE OF UTERUS     ESOPHAGOGASTRODUODENOSCOPY  01/20/2012   Procedure: ESOPHAGOGASTRODUODENOSCOPY (EGD);  Surgeon: Ladene Artist, MD,FACG;  Location: [facility] ENDOSCOPY;  Service: Endoscopy;  Laterality: N/A;   ESOPHAGOGASTRODUODENOSCOPY N/A 03/26/2013   Procedure: ESOPHAGOGASTRODUODENOSCOPY (EGD);  Surgeon: Irene Shipper, MD;  Location: [facility] ENDOSCOPY;  Service: Endoscopy;  Laterality: N/A;   ESOPHAGOGASTRODUODENOSCOPY N/A 04/30/2015   Procedure: ESOPHAGOGASTRODUODENOSCOPY (EGD);  Surgeon: Rogene Houston, MD;  [facility];  Service: Endoscopy;  Laterality: N/A;  1pm - moved to 10/20 @ 1:10   ESOPHAGOGASTRODUODENOSCOPY N/A 07/29/2016   Procedure: ESOPHAGOGASTRODUODENOSCOPY (EGD);  Surgeon: Manus Gunning, MD;  Location:  [place];  Service: Gastroenterology;  Laterality: N/A;  enteroscopy   ESOPHAGOGASTRODUODENOSCOPY N/A 09/26/2019   Procedure: ESOPHAGOGASTRODUODENOSCOPY (EGD);  Surgeon: Rogene Houston, MD;  [facility];  Service: Endoscopy;  Laterality: N/A;  1250   ESOPHAGOGASTRODUODENOSCOPY (EGD) WITH PROPOFOL N/A 02/05/2021   Procedure: ESOPHAGOGASTRODUODENOSCOPY (EGD) WITH PROPOFOL;  Surgeon: Eloise Harman, DO;  [facility];  Service: Endoscopy;  Laterality: N/A;   GIVENS CAPSULE STUDY N/A 03/07/2019   Procedure: GIVENS CAPSULE STUDY;  Surgeon: Rogene Houston, MD;  [facility];  Service: Endoscopy;  Laterality: N/A;  7:30   INSERTION OF DIALYSIS CATHETER N/A 10/05/2020   Procedure: ABORTED TUNNELED DIALYSIS CATHETER PLACEMENT RIGHT INTERNAL JUGULAR VEIN ;  Surgeon: Virl Cagey, MD;  Location: [facility];  Service: General;  Laterality: N/A;   INTRAOPERATIVE TRANSESOPHAGEAL ECHOCARDIOGRAM  06/13/2012   Procedure: INTRAOPERATIVE TRANSESOPHAGEAL ECHOCARDIOGRAM;  Surgeon: Grace Isaac, MD;  Location: [place];  Service: Open Heart Surgery;  Laterality: N/A;   IR DIALY SHUNT INTRO NEEDLE/INTRACATH INITIAL W/IMG LEFT Left 10/06/2020  IR FLUORO GUIDE CV LINE RIGHT  06/17/2020   IR GENERIC HISTORICAL  07/26/2016   IR FLUORO GUIDE CV LINE RIGHT 07/26/2016 Greggory Keen, MD [facility]   IR GENERIC HISTORICAL  07/26/2016   IR US GUIDE VASC ACCESS RIGHT 07/26/2016 Greggory Keen, MD [facility]   IR GENERIC HISTORICAL  08/02/2016   IR US GUIDE VASC ACCESS RIGHT 08/02/2016 Greggory Keen, MD [facility]   IR GENERIC HISTORICAL  08/02/2016   IR FLUORO GUIDE CV LINE RIGHT 08/02/2016 Greggory Keen, MD [facility]   IR RADIOLOGY PERIPHERAL GUIDED IV START  03/28/2017   IR REMOVAL TUN CV CATH W/O FL  08/11/2020   IR THROMBECTOMY AV FISTULA W/THROMBOLYSIS INC/SHUNT/IMG LEFT Left 06/17/2020   IR US GUIDE VASC ACCESS LEFT  06/17/2020   IR US GUIDE VASC ACCESS RIGHT  03/28/2017   IR  US GUIDE VASC ACCESS RIGHT  06/17/2020   LEFT HEART CATH AND CORONARY ANGIOGRAPHY N/A 09/20/2016   Procedure: Left Heart Cath and Coronary Angiography;  Surgeon: [name], MD;  Location: [place] CV LAB;  Service: Cardiovascular;  Laterality: N/A;   LEFT HEART CATH AND CORS/GRAFTS ANGIOGRAPHY N/A 10/13/2016   Procedure: Left Heart Cath and Cors/Grafts Angiography;  Surgeon: Troy Sine, MD;  Location: [place] CV LAB;  Service: Cardiovascular;  Laterality: N/A;   LEFT HEART CATH AND CORS/GRAFTS ANGIOGRAPHY N/A 07/13/2018   Procedure: LEFT HEART CATH AND CORS/GRAFTS ANGIOGRAPHY;  Surgeon: Martinique, Peter M, MD;  Location: [place] CV LAB;  Service: Cardiovascular;  Laterality: N/A;   LEFT HEART CATHETERIZATION WITH CORONARY ANGIOGRAM N/A 12/15/2011   Procedure: LEFT HEART CATHETERIZATION WITH CORONARY ANGIOGRAM;  Surgeon: Burnell Blanks, MD;  Location: [facility] CATH LAB;  Service: Cardiovascular;  Laterality: N/A;   LEFT HEART CATHETERIZATION WITH CORONARY ANGIOGRAM N/A 01/10/2012   Procedure: LEFT HEART CATHETERIZATION WITH CORONARY ANGIOGRAM;  Surgeon: Peter M Martinique, MD;  Location: [facility] CATH LAB;  Service: Cardiovascular;  Laterality: N/A;   LEFT HEART CATHETERIZATION WITH CORONARY ANGIOGRAM N/A 06/08/2012   Procedure: LEFT HEART CATHETERIZATION WITH CORONARY ANGIOGRAM;  Surgeon: Burnell Blanks, MD;  Location: [name] CATH LAB;  Service: Cardiovascular;  Laterality: N/A;   LEFT HEART CATHETERIZATION WITH CORONARY/GRAFT ANGIOGRAM N/A 12/10/2013   Procedure: LEFT HEART CATHETERIZATION WITH Beatrix Fetters;  Surgeon: [name], MD;  Location: [facility] CATH LAB;  Service: Cardiovascular;  Laterality: N/A;   OVARY SURGERY     ovarian cancer   POLYPECTOMY  03/09/2019   Procedure: POLYPECTOMY;  Surgeon: Rogene Houston, MD;  [facility];  Service: Endoscopy;;  cecal    POLYPECTOMY N/A 09/26/2019   Procedure: DUODENAL POLYPECTOMY;  Surgeon: Rogene Houston, MD;   [facility];  Service: Endoscopy;  Laterality: N/A;   REVISION OF ARTERIOVENOUS GORETEX GRAFT N/A 02/24/2017   Procedure: REVISION OF ARTERIOVENOUS GORETEX GRAFT (RESECTION);  Surgeon: Katha Cabal, MD;  Location: [facility] ORS;  Service: Vascular;  Laterality: N/A;   REVISON OF ARTERIOVENOUS FISTULA Left 06/19/2020   Procedure: REVISION OF LEFT UPPER ARM AV GRAFT WITH INTERPOSITION JUMP GRAFT USING 6MM GORE LIMB;  Surgeon: Marty Heck, MD;  Location: [place];  Service: Vascular;  Laterality: Left;   SHUNTOGRAM N/A 10/15/2013   Procedure: Fistulogram;  Surgeon: Serafina Mitchell, MD;  Location: [facility] CATH LAB;  Service: Cardiovascular;  Laterality: N/A;   THROMBECTOMY / ARTERIOVENOUS GRAFT REVISION  2011   left upper arm   TUBAL LIGATION  1980's   UPPER EXTREMITY  ANGIOGRAPHY Bilateral 12/06/2016   Procedure: Upper Extremity Angiography;  Surgeon: Katha Cabal, MD;  Location: [place] [facility] LAB;  Service: Cardiovascular;  Laterality: Bilateral;   UPPER EXTREMITY INTERVENTION Left 06/06/2017   Procedure: UPPER EXTREMITY INTERVENTION;  Surgeon: Katha Cabal, MD;  Location: [place] [facility] LAB;  Service: Cardiovascular;  Laterality: Left;    Social History  reports that she has never smoked. She has never used smokeless tobacco. She reports that she does not drink alcohol and does not use drugs.  Allergies  Allergen Reactions   Aspirin Other (See Comments)    High Doses Mess up her stomach; "makes my bowels have blood in them". Takes 81 mg EC Aspirin    Penicillins Other (See Comments)    SYNCOPE? , "makes me real weak when I take it; like I'll pass out"  Has patient had a PCN reaction causing immediate rash, facial/tongue/throat swelling, SOB or lightheadedness with hypotension: Yes Has patient had a PCN reaction causing severe rash involving mucus membranes or skin necrosis: no Has patient had a PCN reaction that required hospitalization no Has patient had a PCN  reaction occurring within the last 10 years: no If all of the above   Amlodipine Swelling   Bactrim [Sulfamethoxazole-Trimethoprim] Rash   Contrast Media [Iodinated Diagnostic Agents] Itching   Iron Itching and Other (See Comments)    "they gave me iron in dialysis; had to give me Benadryl cause I had to have the iron" (05/02/2012)   Nitrofurantoin Hives   Tylenol [Acetaminophen] Itching and Other (See Comments)    Makes her feet on fire per pt   Gabapentin Other (See Comments)    Unknown reaction   Hydralazine Itching    Has tolerated while inpatient   Levofloxacin    No Known Allergies    Ranexa [Ranolazine] Other (See Comments)    Myoclonus-hospitalized    Dexilant [Dexlansoprazole] Other (See Comments)    Upset stomach   Levaquin [Levofloxacin In D5w] Rash   Morphine And Related Itching and Other (See Comments)    Itching in feet   Plavix [Clopidogrel Bisulfate] Rash   Protonix [Pantoprazole Sodium] Rash   Venofer [Ferric Oxide] Itching and Other (See Comments)    Patient reports using Benadryl prior to doses as [place]    Family History  Problem Relation Age of Onset   Heart disease Mother        Heart Disease before age 37   Hyperlipidemia Mother    Hypertension Mother    Diabetes Mother    Heart attack Mother    Heart disease Father        Heart Disease before age 66   Hyperlipidemia Father    Hypertension Father    Diabetes Father    Diabetes Sister    Hypertension Sister    Diabetes Brother    Hyperlipidemia Brother    Heart attack Brother    Hypertension Sister    Heart attack Brother    Colon cancer Child 38   Other Other        noncontributory for early CAD   Esophageal cancer Neg Hx    Liver disease Neg Hx    Kidney disease Neg Hx    Colon polyps Neg Hx     Prior to Admission medications   Medication Sig Start Date End Date Taking? Authorizing Provider  ALPRAZolam Duanne Moron) 0.5 MG tablet Take 0.5 mg by mouth daily as needed. 03/20/21  Yes  [provider]  aspirin EC 81 MG tablet Take 81 mg by mouth daily.  09/27/19  Yes Rehman, Mechele Dawley, MD  atorvastatin (LIPITOR) 10 MG tablet Take 10 mg by mouth daily.   Yes [provider]  diclofenac Sodium (VOLTAREN) 1 % GEL Apply 2 g topically 4 (four) times daily as needed. Patient taking differently: Apply 2 g topically 4 (four) times daily as needed (pain). 03/12/21  Yes Long, Wonda Olds, MD  fluticasone (FLONASE) 50 MCG/ACT nasal spray Place 1 spray into both nostrils at bedtime as needed for allergies.    Yes [provider]  hydrOXYzine (VISTARIL) 100 MG capsule Take 100 mg by mouth daily as needed for anxiety or itching. 11/23/20  Yes [provider]  isosorbide mononitrate (IMDUR) 60 MG 24 hr tablet Take 1 tablet (60 mg total) by mouth in the morning and at bedtime. 03/25/21 06/23/21 Yes BranchAlphonse Guild, MD  levothyroxine (SYNTHROID) 25 MCG tablet Take 1 tablet (25 mcg total) by mouth daily. 02/02/21  Yes Brita Romp, NP  lidocaine-prilocaine (EMLA) cream Apply 1 application topically every Monday, Wednesday, and Friday. Prior to dialysis   Yes [provider]  loperamide (IMODIUM A-D) 2 MG tablet Take 2 tablets (4 mg total) by mouth daily as needed for diarrhea or loose stools. 12/17/20  Yes Thurnell Lose, MD  metoprolol tartrate (LOPRESSOR) 25 MG tablet Take 0.5 tablets (12.5 mg total) by mouth 2 (two) times daily. 03/16/21  Yes Barton Dubois, MD  midodrine (PROAMATINE) 5 MG tablet Take Monday, Wednesday, Friday morning BEFORE dialysis 12/22/20  Yes Branch, Alphonse Guild, MD  multivitamin (RENA-VIT) TABS tablet Take 1 tablet by mouth daily.   Yes [provider]  nitroGLYCERIN (NITROSTAT) 0.4 MG SL tablet Place 1 tablet (0.4 mg total) under the tongue every 5 (five) minutes x 3 doses as needed for chest pain (if no relief after 2nd dose, proceed to the ED for an evaluation or call 911). 02/19/21  Yes Verta Ellen., NP  omeprazole  (PRILOSEC) 20 MG capsule Take 1 capsule (20 mg total) by mouth daily. 12/01/20  Yes Rehman, Mechele Dawley, MD  sevelamer carbonate (RENVELA) 800 MG tablet Take 2 tablets (1,600 mg total) by mouth 3 (three) times daily with meals. 06/20/20  Yes Meccariello, Bernita Raisin, DO  loratadine (CLARITIN) 10 MG tablet Take 1 tablet (10 mg total) by mouth daily as needed for allergies. Patient not taking: No sig reported 07/14/18   Hosie Poisson, MD  simvastatin (ZOCOR) 20 MG tablet Take 20 mg by mouth daily.    [provider]    Physical Exam: Vitals:   04/09/21 1330 04/09/21 1345 04/09/21 1400 04/09/21 1430  BP: 117/64 (!) 117/55 108/62 (!) 98/42  Pulse: (!) 127 (!) 52 (!) 129 (!) 53  Resp: (!) 26 (!) 28 (!) 21 (!) 21  Temp:      TempSrc:      SpO2: 100% 100% 100% 100%  Weight:      Height:        Constitutional: Chronically ill in appearance, denies chest pain and shortness of breath. Vitals:   04/09/21 1330 04/09/21 1345 04/09/21 1400 04/09/21 1430  BP: 117/64 (!) 117/55 108/62 (!) 98/42  Pulse: (!) 127 (!) 52 (!) 129 (!) 53  Resp: (!) 26 (!) 28 (!) 21 (!) 21  Temp:      TempSrc:      SpO2: 100% 100% 100% 100%  Weight:      Height:  Eyes: PERRL, lids and conjunctivae normal, no icterus. ENMT: Mucous membranes are moist. Posterior pharynx clear of any exudate or lesions. Neck: normal, supple, no masses, no thyromegaly, no JVD Respiratory: clear to auscultation bilaterally, no wheezing, no crackles. Normal respiratory effort. No accessory muscle use.  Cardiovascular: Regular rate and rhythm, no rubs or gallops. No extremity edema. 2+ pedal pulses. No carotid bruits.  Abdomen: no tenderness, no masses palpated. No hepatosplenomegaly. Bowel sounds positive.  Musculoskeletal: no clubbing / cyanosis. No joint deformity upper and lower extremities.  Decreased range of motion secondary to pain in her left leg. Skin: no petechiae. Neurologic: CN 2-12 grossly intact.  No focal neurologic  deficits. Psychiatric: Normal mood.  Judgment appears to be stable.   Labs on Admission: I have personally reviewed following labs and imaging studies  CBC: Recent Labs  Lab 04/09/21 1212  WBC 6.9  HGB 6.9*  HCT 22.4*  MCV 121.7*  PLT 914    Basic Metabolic Panel: Recent Labs  Lab 04/09/21 1212  NA 138  K 4.0  CL 100  CO2 28  GLUCOSE 141*  BUN 39*  CREATININE 7.34*  CALCIUM 9.2    GFR: Estimated Creatinine Clearance: 4.8 mL/min (A) (by C-G formula based on SCr of 7.34 mg/dL (H)).   Urine analysis:    Component Value Date/Time   COLORURINE YELLOW 12/16/2012 1919   APPEARANCEUR CLOUDY (A) 12/16/2012 1919   LABSPEC 1.009 12/16/2012 1919   PHURINE 7.5 12/16/2012 1919   GLUCOSEU NEGATIVE 12/16/2012 1919   HGBUR TRACE (A) 12/16/2012 1919   BILIRUBINUR NEGATIVE 12/16/2012 1919   KETONESUR NEGATIVE 12/16/2012 1919   PROTEINUR 100 (A) 12/16/2012 1919   UROBILINOGEN 0.2 12/16/2012 1919   NITRITE NEGATIVE 12/16/2012 1919   LEUKOCYTESUR SMALL (A) 12/16/2012 1919    Radiological Exams on Admission: US Venous Img Lower Unilateral Left  Result Date: 04/09/2021 CLINICAL DATA:  Shortness of breath. Left lower extremity pain and swelling. EXAM: LEFT LOWER EXTREMITY VENOUS DOPPLER ULTRASOUND TECHNIQUE: Gray-scale sonography with graded compression, as well as color Doppler and duplex ultrasound were performed to evaluate the lower extremity deep venous systems from the level of the common femoral vein and including the common femoral, femoral, profunda femoral, popliteal and calf veins including the posterior tibial, peroneal and gastrocnemius veins when visible. The superficial great saphenous vein was also interrogated. Spectral Doppler was utilized to evaluate flow at rest and with distal augmentation maneuvers in the common femoral, femoral and popliteal veins. COMPARISON:  None. FINDINGS: Contralateral Common Femoral Vein: Respiratory phasicity is normal and symmetric with  the symptomatic side. No evidence of thrombus. Normal compressibility. Common Femoral Vein: No evidence of thrombus. Normal compressibility, respiratory phasicity and response to augmentation. Saphenofemoral Junction: No evidence of thrombus. Normal compressibility and flow on color Doppler imaging. Profunda Femoral Vein: No evidence of thrombus. Normal compressibility and flow on color Doppler imaging. Femoral Vein: No evidence of thrombus. Normal compressibility, respiratory phasicity and response to augmentation. Popliteal Vein: No evidence of thrombus. Normal compressibility, respiratory phasicity and response to augmentation. Calf Veins: Visualized left deep calf veins are patent without thrombus. Superficial Great Saphenous Vein: No evidence of thrombus. Normal compressibility. Other Findings: Small fluid collection in the left popliteal fossa that measures 1.8 x 0.6 x 1.6 cm. Normal compressibility in the visualized left short saphenous vein. Subcutaneous edema in the left ankle and calf. IMPRESSION: 1.  Negative for deep venous thrombosis in left lower extremity. 2. Small left knee Baker's cyst. Electronically Signed   By:  Markus Daft M.D.   On: 04/09/2021 13:57   DG Chest Port 1 View  Result Date: 04/09/2021 CLINICAL DATA:  sob EXAM: PORTABLE CHEST 1 VIEW COMPARISON:  03/15/2021. FINDINGS: Similar left greater than right basilar opacities. Probable small bilateral pleural effusions. No visible pneumothorax on this semi erect radiograph. Similar mild enlargement the cardiac silhouette. Median sternotomy and CABG. Similar pulmonary vascular congestion. IMPRESSION: 1. Similar probable small bilateral pleural effusions with overlying left greater than right basilar atelectasis and/or consolidation. 2. Similar cardiomegaly and pulmonary vascular congestion. Electronically Signed   By: Margaretha Sheffield M.D.   On: 04/09/2021 12:31    EKG: Independently reviewed.  Atrial fibrillation; no acute ischemic  changes.  Assessment/Plan 1-acute on chronic anemia/symptomatic anemia: Patient with history of chronic blood loss and anemia of chronic kidney disease. -No overt bleeding reported or appreciated currently.  Negative fecal occult blood test -Hemoglobin down to 6.9 -1 unit of PRBC has been ordered -Will follow hemoglobin trend -IV iron and Epogen as per nephrology discretion. -Avoid the use of anticoagulation/heparin products.  2-paroxysmal atrial fibrillation -Patient with transient episode of A. fib with RVR on presentation -Received Cardizem in the ED along with resumption of home metoprolol -Heart rate controlled currently -Continue to monitor on telemetry. -Not on anticoagulation secondary to chronic GI bleed.  3-chronic diastolic heart failure -Overall compensated -Continue daily weights and strict I's and O's -Volume management with hemodialysis.  4-gastroesophageal reflux disease -Continue PPI.  5-essential hypertension -Resume home antihypertensive agents -Vital signs overall appears to be stable.  6-hypothyroidism -Continue Synthroid  7-hyperlipidemia -Continue statins  8-type 2 diabetes mellitus non-insulin-dependent with nephropathy -Continue to follow CBGs -Patient no using hypoglycemic agents currently.  9-elevated troponin/no chest pain -Appears to be demand ischemia/strain in the setting of acute transient A. fib with RVR-he experienced -Recent admission with echo demonstrating no wall motion abnormalities -Continue medical management.  DVT prophylaxis: SCDs Code Status:   Full code Family Communication:  No family at bedside. Disposition Plan:   Patient is from:  Home  Anticipated DC to:  Home  Anticipated DC date:  04/10/21  Anticipated DC barriers: Stabilization of patient hemoglobin status.  Consults called:  Nephrology service Admission status:  Observation, telemetry, length of stay less than 2 midnights.  Severity of Illness: The  appropriate patient status for this patient is OBSERVATION. Observation status is judged to be reasonable and necessary in order to provide the required intensity of service to ensure the patient's safety. The patient's presenting symptoms, physical exam findings, and initial radiographic and laboratory data in the context of their medical condition is felt to place them at decreased risk for further clinical deterioration. Furthermore, it is anticipated that the patient will be medically stable for discharge from the hospital within 2 midnights of admission. The following factors support the patient status of observation.   " The patient's presenting symptoms include generalized weakness and left lower extremity pain. " The physical exam findings include patient appears to be pale and complaining of generalized weakness/deconditioning.. " The initial radiographic and laboratory data are hemoglobin of 6.9; no acute cardiopulmonary process and negative lower extremity US for DVT.    Barton Dubois MD [facility]  How to contact the [facility] Attending or Consulting provider [place] or covering provider during after hours [place], for this patient?   Check the care team in [facility] and look for a) attending/consulting [facility] provider listed and b) the [facility] team listed Log into [email or web address] and use [facility]'s universal password to access. If you do not have the password, please contact the hospital operator. Locate the [facility] provider you are looking for under [facility] and page to a number that you can be directly reached. If you still have difficulty reaching the provider, please page the [facility] (Director on Call) for the Hospitalists listed on amion for assistance.  04/09/2021, 3:12 PM

## 2021-04-09 NOTE — ED Notes (Signed)
Date and time results received: 04/09/21 1335 (use smartphrase ".now" to insert current time)  Test: trop & Hgb Critical Value: 156 trop & 6.9 hgb  Name of Provider Notified: Dr. Gilford Raid  Orders Received? Or Actions Taken?: see chart

## 2021-04-09 NOTE — Plan of Care (Signed)
  Problem: Education: Goal: Knowledge of General Education information will improve Description Including pain rating scale, medication(s)/side effects and non-pharmacologic comfort measures Outcome: Progressing   Problem: Health Behavior/Discharge Planning: Goal: Ability to manage health-related needs will improve Outcome: Progressing   

## 2021-04-09 NOTE — Procedures (Signed)
   HEMODIALYSIS TREATMENT NOTE:  3 hour heparin-free HD session was performed in the ED after pt became bradycardic with initiation of diltiazem infusion.  Pre-HD she was alert, conversant and without complaints of dizziness or chest pain/pressure.  122/53 p60s.  She is well known to our service and has labile BP with HD.    HD was initiated with 2L goal which was soon lowered d/t hypotension.  She had orders for one unit pRBC transfusion with HD however we learned (after starting treatment) that the blood was coming from a remote blood bank.  Treatment ended with 1.6L removed and blood had not yet arrived.  All blood was returned and hemostasis was achieved in 15 minutes.  No changes from pre-HD assessment.  Rockwell Alexandria, RN

## 2021-04-09 NOTE — ED Provider Notes (Signed)
[facility] Provider Note   CSN: [id number] Arrival date & time: 04/09/21  1116     History Chief Complaint  Patient presents with   Leg Pain    Shawna [name] is a 80 y.o. female.  Pt presents to the ED today with pain to her left leg.  Pt fell about a month ago and has had pain in her knee since then.  She has had xrays twice which were nl.  She was getting ready to go to dialysis and her leg was hurting more than normal.  She also said her bp was low at home.  Pt did take all of her meds this am.  Pt was admitted from 9/5-9/6 for cp and paroxysmal afib.  Pt is on metoprolol for rate control.  She is not on blood thinners secondary to GIB risk.  Pt's last dialysis was on Wed. 9/28.  She came here instead of dialysis.      Past Medical History:  Diagnosis Date   Acute on chronic respiratory failure with hypoxia ([place]) 10/10/2016   Anxiety    Arthritis    AVM (arteriovenous malformation) of colon    CAD (coronary artery disease)    a. s/p CABG in 2013 b. DES to D1 in 10/2016. c. cath in 07/2018 showing patent grafts with occlusion of D1 at prior stent site and progression of PDA disease --> medical management recommended   Carotid artery disease ([place])    a. 83-41% LICA, 03/6221    Chronic anemia    Chronic bronchitis (HCC)    Chronic diastolic CHF (congestive heart failure) ([place])    a. 02/2012 Echo EF 60-65%, nl wall motion, Gr 1 DD, mod MR   Colon cancer ([place]) 1992   Esophageal stricture    ESRD on hemodialysis ([place])    ESRD due to HTN, started dialysis 2011 and gets HD at [facility] with Dr Hinda Lenis on MWF schedule.  Access is LUA AVF as of Sept 2014.    GERD (gastroesophageal reflux disease)    High cholesterol 12/2011   History of blood transfusion 07/2011; 12/2011; 01/2012 X 2; 04/2012   History of gout    History of lower GI bleeding    Hypertension    Iron deficiency anemia    Jugular vein occlusion, right (HCC)    Mitral regurgitation    a.  Moderate by echo, 02/2012   Myocardial infarction [facility])    NSVT (nonsustained ventricular tachycardia) ([place])    Ovarian cancer ([place]) 1992   PAF (paroxysmal atrial fibrillation) ([place])    Pneumonia ~ 2009   PUD (peptic ulcer disease)    TIA (transient ischemic attack)     Patient Active Problem List   Diagnosis Date Noted   Left knee pain 03/15/2021   Symptomatic anemia 02/27/2021   Moderate protein-calorie malnutrition ([place]) 02/27/2021   Hypokalemia 02/27/2021   COVID-19 virus infection 02/03/2021   Leukocytosis 02/03/2021   Elevated MCV 02/03/2021   Hypoalbuminemia due to protein-calorie malnutrition ([place]) 02/03/2021   Hypothyroidism (acquired) 02/03/2021   Myositis 12/03/2020   Ataxia 12/02/2020   Diabetes mellitus type 2 in nonobese ([place]) 11/10/2020   Acute pulmonary edema ([place]) 11/10/2020   Generalized weakness 11/09/2020   Dialysis AV fistula malfunction, initial encounter ([place])    Jugular vein occlusion, right (HCC)    Failure of surgically constructed arteriovenous fistula ([place]) 10/03/2020   Myoclonus 08/31/2020   Clotted renal dialysis AV graft, initial encounter ([place])    Hypertensive heart  and chronic kidney disease with heart failure and stage 1 through stage 4 chronic kidney disease, or chronic kidney disease ([place])    Hemodialysis-associated hypotension    Acute hypoxemic respiratory failure ([place]) 06/14/2020   Hypertension 06/12/2020   Irritable bowel syndrome 02/25/2020   Adenomatous duodenal polyp 09/10/2019   History of GI bleed 09/10/2019   Angina pectoris ([place]) 06/05/2019   Chest pain 06/03/2019   Small intestinal bacterial overgrowth 05/14/2019   Iron deficiency anemia 04/02/2019   GI bleed 03/08/2019   Gastrointestinal hemorrhage with melena 03/06/2019   Acute respiratory failure with hypoxia (HCC) 12/25/2018   Elevated troponin 12/14/2018   Chest pain at rest 07/13/2018   Hand steal syndrome ([place]) 08/01/2017   Anemia 07/14/2017   Coronary artery  disease 06/05/2017   Mesenteric ischemia (HCC)    Diverticulitis    Enteritis    Complication of vascular access for dialysis 03/19/2017   Preoperative clearance 01/25/2017   H/O non-ST elevation myocardial infarction (NSTEMI) 10/24/2016   Fluid overload 95/03/3266   Complication from renal dialysis device 10/10/2016   Non-ST elevation MI (NSTEMI) ([place])    Encounter for fitting and adjustment of vascular catheter    Heme positive stool    Demand ischemia ([place]) 07/27/2016   Hypertensive emergency 07/08/2016   Acute on chronic respiratory failure with hypoxia [facility])    Cardiac arrest [facility])    Palliative care encounter    Goals of care, counseling/discussion    Hypertensive crisis without congestive heart failure 05/09/2016   Flash pulmonary edema ([place]) 04/06/2016   Acute respiratory failure ([place]) 04/06/2016   Hypertensive crisis 01/27/2016   History of colon cancer 01/27/2016   History of ovarian cancer 01/27/2016   Hypertensive urgency 01/27/2016   Paroxysmal atrial fibrillation ([place]) 10/14/2015   Coronary angioplasty status 10/14/2015   Malignant neoplasm of right ovary ([place]) 10/14/2015   Narrow complex tachycardia ([place]) 09/08/2015   SVT (supraventricular tachycardia) ([place]) 09/08/2015   Influenza A 08/30/2015   Acute on chronic diastolic CHF (congestive heart failure) ([place]) 05/04/2015   Unstable angina ([place]) 05/03/2015   DOE (dyspnea on exertion)    Essential hypertension    Hyponatremia 10/01/2014   Pain in joint, lower leg 08/14/2014   Dacryocystitis 05/29/2013   Chronic diastolic CHF (congestive heart failure) ([place]) 03/22/2013   GI bleeding 03/21/2013   Acute post-hemorrhagic anemia 03/21/2013   Occlusion and stenosis of carotid artery without mention of cerebral infarction 01/24/2013   Hx of CABG 07/05/2012   Carotid artery disease ([place]) 07/05/2012   Mitral regurgitation 06/12/2012   Pneumonia 06/09/2012   Non-STEMI (non-ST elevated myocardial infarction) ([place])  06/08/2012   Ischemic chest pain ([place]) 03/01/2012   AVM (arteriovenous malformation) of small bowel, acquired 01/20/2012   GERD (gastroesophageal reflux disease) 01/09/2012   HLD (hyperlipidemia) 01/05/2012   Atherosclerotic heart disease of native coronary artery without angina pectoris 12/16/2011   Essential hypertension, benign 12/16/2011   ESRD on hemodialysis ([place]) 12/16/2011   Anxiety disorder 05/04/2011   Anemia in chronic kidney disease 04/29/2011   ESRD (end stage renal disease) on dialysis ([place]) 04/29/2011   Gout 04/29/2011   Hypertensive chronic kidney disease with stage 5 chronic kidney disease or end stage renal disease ([place]) 04/29/2011    Past Surgical History:  Procedure Laterality Date   A/V SHUNTOGRAM Left 03/19/2019   Procedure: A/V SHUNTOGRAM;  Surgeon: Katha Cabal, MD;  Location: [facility] CV LAB;  Service: Cardiovascular;  Laterality: Left;   ABDOMINAL HYSTERECTOMY  1992   APPENDECTOMY  06/1990   AV FISTULA PLACEMENT  07/2009   left upper arm   AV FISTULA PLACEMENT Right 09/06/2016   Procedure: RIGHT FOREARM ARTERIOVENOUS (AV) GRAFT;  Surgeon: Elam Dutch, MD;  Location: [facility];  Service: Vascular;  Laterality: Right;   AV FISTULA PLACEMENT N/A 02/24/2017   Procedure: INSERTION OF ARTERIOVENOUS (AV) GORE-TEX GRAFT ARM (BRACHIAL AXILLARY);  Surgeon: Katha Cabal, MD;  Location: [facility] ORS;  Service: Vascular;  Laterality: N/A;   [place] Right 09/06/2016   Procedure: REMOVAL OF Right Arm ARTERIOVENOUS GORETEX GRAFT and Vein Patch angioplasty of brachial artery;  Surgeon: Angelia Mould, MD;  Location: [facility];  Service: Vascular;  Laterality: Right;   BIOPSY  09/26/2019   Procedure: BIOPSY;  Surgeon: Rogene Houston, MD;  [facility];  Service: Endoscopy;;   COLON RESECTION  1992   COLON SURGERY     COLONOSCOPY N/A 03/09/2019   Procedure: COLONOSCOPY;  Surgeon: Rogene Houston, MD;  [facility];  Service: Endoscopy;   Laterality: N/A;   CORONARY ANGIOPLASTY WITH STENT PLACEMENT  12/15/11   "2"   CORONARY ANGIOPLASTY WITH STENT PLACEMENT  y/2013   "1; makes total of 3" (05/02/2012)   CORONARY ARTERY BYPASS GRAFT  06/13/2012   Procedure: CORONARY ARTERY BYPASS GRAFTING (CABG);  Surgeon: Grace Isaac, MD;  Location: [facility];  Service: Open Heart Surgery;  Laterality: N/A;  cabg x four;  using left internal mammary artery, and left leg greater saphenous vein harvested endoscopically   CORONARY STENT INTERVENTION N/A 10/13/2016   Procedure: Coronary Stent Intervention;  Surgeon: Troy Sine, MD;  Location: [facility];  Service: Cardiovascular;  Laterality: N/A;   DIALYSIS/PERMA CATHETER REMOVAL N/A 04/18/2017   Procedure: DIALYSIS/PERMA CATHETER REMOVAL;  Surgeon: Katha Cabal, MD;  Location: [facility];  Service: Cardiovascular;  Laterality: N/A;   DILATION AND CURETTAGE OF UTERUS     ESOPHAGOGASTRODUODENOSCOPY  01/20/2012   Procedure: ESOPHAGOGASTRODUODENOSCOPY (EGD);  Surgeon: Ladene Artist, MD,FACG;  Location: [facility] ENDOSCOPY;  Service: Endoscopy;  Laterality: N/A;   ESOPHAGOGASTRODUODENOSCOPY N/A 03/26/2013   Procedure: ESOPHAGOGASTRODUODENOSCOPY (EGD);  Surgeon: Irene Shipper, MD;  Location: [facility] ENDOSCOPY;  Service: Endoscopy;  Laterality: N/A;   ESOPHAGOGASTRODUODENOSCOPY N/A 04/30/2015   Procedure: ESOPHAGOGASTRODUODENOSCOPY (EGD);  Surgeon: Rogene Houston, MD;  [facility];  Service: Endoscopy;  Laterality: N/A;  1pm - moved to 10/20 @ 1:10   ESOPHAGOGASTRODUODENOSCOPY N/A 07/29/2016   Procedure: ESOPHAGOGASTRODUODENOSCOPY (EGD);  Surgeon: Manus Gunning, MD;  Location: [facility];  Service: Gastroenterology;  Laterality: N/A;  enteroscopy   ESOPHAGOGASTRODUODENOSCOPY N/A 09/26/2019   Procedure: ESOPHAGOGASTRODUODENOSCOPY (EGD);  Surgeon: Rogene Houston, MD;  [facility];  Service: Endoscopy;  Laterality: N/A;  1250   ESOPHAGOGASTRODUODENOSCOPY (EGD)  WITH PROPOFOL N/A 02/05/2021   Procedure: ESOPHAGOGASTRODUODENOSCOPY (EGD) WITH PROPOFOL;  Surgeon: Eloise Harman, DO;  [facility];  Service: Endoscopy;  Laterality: N/A;   GIVENS CAPSULE STUDY N/A 03/07/2019   Procedure: GIVENS CAPSULE STUDY;  Surgeon: Rogene Houston, MD;  [facility];  Service: Endoscopy;  Laterality: N/A;  7:30   INSERTION OF DIALYSIS CATHETER N/A 10/05/2020   Procedure: ABORTED TUNNELED DIALYSIS CATHETER PLACEMENT RIGHT INTERNAL JUGULAR VEIN ;  Surgeon: Virl Cagey, MD;  Location: [facility];  Service: General;  Laterality: N/A;   INTRAOPERATIVE TRANSESOPHAGEAL ECHOCARDIOGRAM  06/13/2012   Procedure: INTRAOPERATIVE TRANSESOPHAGEAL ECHOCARDIOGRAM;  Surgeon: Grace Isaac, MD;  [facility]  OR;  Service: Open Heart Surgery;  Laterality: N/A;   IR DIALY SHUNT INTRO NEEDLE/INTRACATH INITIAL W/IMG LEFT Left 10/06/2020   IR FLUORO GUIDE CV LINE RIGHT  06/17/2020   IR GENERIC HISTORICAL  07/26/2016   IR FLUORO GUIDE CV LINE RIGHT 07/26/2016 Greggory Keen, MD [facility]   IR GENERIC HISTORICAL  07/26/2016   IR US GUIDE VASC ACCESS RIGHT 07/26/2016 Greggory Keen, MD [facility]   IR GENERIC HISTORICAL  08/02/2016   IR US GUIDE VASC ACCESS RIGHT 08/02/2016 Greggory Keen, MD [facility]   IR GENERIC HISTORICAL  08/02/2016   IR FLUORO GUIDE CV LINE RIGHT 08/02/2016 Greggory Keen, MD [facility]   IR RADIOLOGY PERIPHERAL GUIDED IV START  03/28/2017   IR REMOVAL TUN CV CATH W/O FL  08/11/2020   IR THROMBECTOMY AV FISTULA W/THROMBOLYSIS INC/SHUNT/IMG LEFT Left 06/17/2020   IR US GUIDE VASC ACCESS LEFT  06/17/2020   IR US GUIDE VASC ACCESS RIGHT  03/28/2017   IR US GUIDE VASC ACCESS RIGHT  06/17/2020   LEFT HEART CATH AND CORONARY ANGIOGRAPHY N/A 09/20/2016   Procedure: Left Heart Cath and Coronary Angiography;  Surgeon: [name], MD;  Location: [facility] CV LAB;  Service: Cardiovascular;  Laterality: N/A;   LEFT HEART CATH AND CORS/GRAFTS ANGIOGRAPHY  N/A 10/13/2016   Procedure: Left Heart Cath and Cors/Grafts Angiography;  Surgeon: Troy Sine, MD;  Location: [facility] LAB;  Service: Cardiovascular;  Laterality: N/A;   LEFT HEART CATH AND CORS/GRAFTS ANGIOGRAPHY N/A 07/13/2018   Procedure: LEFT HEART CATH AND CORS/GRAFTS ANGIOGRAPHY;  Surgeon: Martinique, Peter M, MD;  Location: [facility] CV LAB;  Service: Cardiovascular;  Laterality: N/A;   LEFT HEART CATHETERIZATION WITH CORONARY ANGIOGRAM N/A 12/15/2011   Procedure: LEFT HEART CATHETERIZATION WITH CORONARY ANGIOGRAM;  Surgeon: Burnell Blanks, MD;  Location: [facility] CATH LAB;  Service: Cardiovascular;  Laterality: N/A;   LEFT HEART CATHETERIZATION WITH CORONARY ANGIOGRAM N/A 01/10/2012   Procedure: LEFT HEART CATHETERIZATION WITH CORONARY ANGIOGRAM;  Surgeon: Peter M Martinique, MD;  Location: [facility] CATH LAB;  Service: Cardiovascular;  Laterality: N/A;   LEFT HEART CATHETERIZATION WITH CORONARY ANGIOGRAM N/A 06/08/2012   Procedure: LEFT HEART CATHETERIZATION WITH CORONARY ANGIOGRAM;  Surgeon: Burnell Blanks, MD;  Location: [facility] CATH LAB;  Service: Cardiovascular;  Laterality: N/A;   LEFT HEART CATHETERIZATION WITH CORONARY/GRAFT ANGIOGRAM N/A 12/10/2013   Procedure: LEFT HEART CATHETERIZATION WITH Beatrix Fetters;  Surgeon: [name], MD;  Location: [facility] CATH LAB;  Service: Cardiovascular;  Laterality: N/A;   OVARY SURGERY     ovarian cancer   POLYPECTOMY  03/09/2019   Procedure: POLYPECTOMY;  Surgeon: Rogene Houston, MD;  [facility];  Service: Endoscopy;;  cecal    POLYPECTOMY N/A 09/26/2019   Procedure: DUODENAL POLYPECTOMY;  Surgeon: Rogene Houston, MD;  [facility];  Service: Endoscopy;  Laterality: N/A;   REVISION OF ARTERIOVENOUS GORETEX GRAFT N/A 02/24/2017   Procedure: REVISION OF ARTERIOVENOUS GORETEX GRAFT (RESECTION);  Surgeon: Katha Cabal, MD;  Location: [facility] ORS;  Service: Vascular;  Laterality: N/A;   REVISON OF ARTERIOVENOUS  FISTULA Left 06/19/2020   Procedure: REVISION OF LEFT UPPER ARM AV GRAFT WITH INTERPOSITION JUMP GRAFT USING 6MM GORE LIMB;  Surgeon: Marty Heck, MD;  Location: [facility];  Service: Vascular;  Laterality: Left;   SHUNTOGRAM N/A 10/15/2013   Procedure: Fistulogram;  Surgeon: Serafina Mitchell, MD;  Location: [facility] CATH LAB;  Service: Cardiovascular;  Laterality: N/A;  THROMBECTOMY / ARTERIOVENOUS GRAFT REVISION  2011   left upper arm   TUBAL LIGATION  1980's   UPPER EXTREMITY ANGIOGRAPHY Bilateral 12/06/2016   Procedure: Upper Extremity Angiography;  Surgeon: Katha Cabal, MD;  Location: [place] [facility] CV LAB;  Service: Cardiovascular;  Laterality: Bilateral;   UPPER EXTREMITY INTERVENTION Left 06/06/2017   Procedure: UPPER EXTREMITY INTERVENTION;  Surgeon: Katha Cabal, MD;  Location: [facility] CV LAB;  Service: Cardiovascular;  Laterality: Left;     OB History     Gravida  2   Para  2   Term      Preterm  2   AB      Living  2      SAB      IAB      Ectopic      Multiple      Live Births              Family History  Problem Relation Age of Onset   Heart disease Mother        Heart Disease before age 76   Hyperlipidemia Mother    Hypertension Mother    Diabetes Mother    Heart attack Mother    Heart disease Father        Heart Disease before age 17   Hyperlipidemia Father    Hypertension Father    Diabetes Father    Diabetes Sister    Hypertension Sister    Diabetes Brother    Hyperlipidemia Brother    Heart attack Brother    Hypertension Sister    Heart attack Brother    Colon cancer Child 91   Other Other        noncontributory for early CAD   Esophageal cancer Neg Hx    Liver disease Neg Hx    Kidney disease Neg Hx    Colon polyps Neg Hx     Social History   Tobacco Use   Smoking status: Never   Smokeless tobacco: Never  Vaping Use   Vaping Use: Never used  Substance Use Topics   Alcohol use: No    Alcohol/week: 0.0  standard drinks   Drug use: No    Home Medications Prior to Admission medications   Medication Sig Start Date End Date Taking? Authorizing Provider  ALPRAZolam Duanne Moron) 0.5 MG tablet Take 0.5 mg by mouth daily as needed. 03/20/21  Yes [provider]  aspirin EC 81 MG tablet Take 81 mg by mouth daily.  09/27/19  Yes Rehman, Mechele Dawley, MD  atorvastatin (LIPITOR) 10 MG tablet Take 10 mg by mouth daily.   Yes [provider]  diclofenac Sodium (VOLTAREN) 1 % GEL Apply 2 g topically 4 (four) times daily as needed. Patient taking differently: Apply 2 g topically 4 (four) times daily as needed (pain). 03/12/21  Yes Long, Wonda Olds, MD  fluticasone (FLONASE) 50 MCG/ACT nasal spray Place 1 spray into both nostrils at bedtime as needed for allergies.    Yes [provider]  hydrOXYzine (VISTARIL) 100 MG capsule Take 100 mg by mouth daily as needed for anxiety or itching. 11/23/20  Yes [provider]  isosorbide mononitrate (IMDUR) 60 MG 24 hr tablet Take 1 tablet (60 mg total) by mouth in the morning and at bedtime. 03/25/21 06/23/21 Yes BranchAlphonse Guild, MD  levothyroxine (SYNTHROID) 25 MCG tablet Take 1 tablet (25 mcg total) by mouth daily. 02/02/21  Yes Brita Romp, NP  lidocaine-prilocaine (EMLA) cream  Apply 1 application topically every Monday, Wednesday, and Friday. Prior to dialysis   Yes [provider]  loperamide (IMODIUM A-D) 2 MG tablet Take 2 tablets (4 mg total) by mouth daily as needed for diarrhea or loose stools. 12/17/20  Yes Thurnell Lose, MD  metoprolol tartrate (LOPRESSOR) 25 MG tablet Take 0.5 tablets (12.5 mg total) by mouth 2 (two) times daily. 03/16/21  Yes Barton Dubois, MD  midodrine (PROAMATINE) 5 MG tablet Take Monday, Wednesday, Friday morning BEFORE dialysis 12/22/20  Yes Branch, Alphonse Guild, MD  multivitamin (RENA-VIT) TABS tablet Take 1 tablet by mouth daily.   Yes [provider]  nitroGLYCERIN (NITROSTAT) 0.4 MG SL  tablet Place 1 tablet (0.4 mg total) under the tongue every 5 (five) minutes x 3 doses as needed for chest pain (if no relief after 2nd dose, proceed to the ED for an evaluation or call 911). 02/19/21  Yes Verta Ellen., NP  omeprazole (PRILOSEC) 20 MG capsule Take 1 capsule (20 mg total) by mouth daily. 12/01/20  Yes Rehman, Mechele Dawley, MD  sevelamer carbonate (RENVELA) 800 MG tablet Take 2 tablets (1,600 mg total) by mouth 3 (three) times daily with meals. 06/20/20  Yes Meccariello, Bernita Raisin, DO  benzonatate (TESSALON PERLES) 100 MG capsule Take 1 capsule (100 mg total) by mouth every 6 (six) hours as needed for cough. Patient not taking: No sig reported 02/28/21   Patrecia Pour, MD  loratadine (CLARITIN) 10 MG tablet Take 1 tablet (10 mg total) by mouth daily as needed for allergies. Patient not taking: No sig reported 07/14/18   Hosie Poisson, MD  simvastatin (ZOCOR) 20 MG tablet Take 20 mg by mouth daily.    [provider]    Allergies    Aspirin, Penicillins, Amlodipine, Bactrim [sulfamethoxazole-trimethoprim], Contrast media [iodinated diagnostic agents], Iron, Nitrofurantoin, Tylenol [acetaminophen], Gabapentin, Hydralazine, Levofloxacin, No known allergies, Ranexa [ranolazine], Dexilant [dexlansoprazole], Levaquin [levofloxacin in d5w], Morphine and related, Plavix [clopidogrel bisulfate], Protonix [pantoprazole sodium], and Venofer [ferric oxide]  Review of Systems   Review of Systems  Musculoskeletal:        Left leg pain  All other systems reviewed and are negative.  Physical Exam Updated Vital Signs BP (!) 117/55   Pulse (!) 52   Temp 97.7 F (36.5 C) (Oral)   Resp (!) 28   Ht 5\' 2"  (1.575 m)   Wt 54 kg   SpO2 100%   BMI 21.77 kg/m   Physical Exam Vitals and nursing note reviewed.  Constitutional:      Appearance: Normal appearance.  HENT:     Head: Normocephalic and atraumatic.     Right Ear: External ear normal.     Left Ear: External ear normal.      Nose: Nose normal.     Mouth/Throat:     Mouth: Mucous membranes are moist.     Pharynx: Oropharynx is clear.  Eyes:     Extraocular Movements: Extraocular movements intact.     Conjunctiva/sclera: Conjunctivae normal.     Pupils: Pupils are equal, round, and reactive to light.  Cardiovascular:     Rate and Rhythm: Regular rhythm. Tachycardia present.     Pulses: Normal pulses.     Heart sounds: Normal heart sounds.  Pulmonary:     Effort: Pulmonary effort is normal.     Breath sounds: Normal breath sounds.  Abdominal:     General: Abdomen is flat. Bowel sounds are normal.     Palpations: Abdomen  is soft.  Genitourinary:    Rectum: Guaiac result negative.  Musculoskeletal:     Cervical back: Normal range of motion and neck supple.     Left lower leg: Edema present.  Skin:    General: Skin is warm.     Capillary Refill: Capillary refill takes less than 2 seconds.  Neurological:     General: No focal deficit present.     Mental Status: She is alert and oriented to person, place, and time.  Psychiatric:        Mood and Affect: Mood normal.        Behavior: Behavior normal.    ED Results / Procedures / Treatments   Labs (all labs ordered are listed, but only abnormal results are displayed) Labs Reviewed  BASIC METABOLIC PANEL - Abnormal; Notable for the following components:      Result Value   Glucose, Bld 141 (*)    BUN 39 (*)    Creatinine, Ser 7.34 (*)    GFR, Estimated 5 (*)    All other components within normal limits  CBC - Abnormal; Notable for the following components:   RBC 1.84 (*)    Hemoglobin 6.9 (*)    HCT 22.4 (*)    MCV 121.7 (*)    MCH 37.5 (*)    RDW 22.9 (*)    All other components within normal limits  TROPONIN I (HIGH SENSITIVITY) - Abnormal; Notable for the following components:   Troponin I (High Sensitivity) 156 (*)    All other components within normal limits  RESP PANEL BY RT-PCR (FLU A&B, COVID) ARPGX2  TSH  POC OCCULT BLOOD, ED  TYPE  AND SCREEN  PREPARE RBC (CROSSMATCH)  PREPARE RBC (CROSSMATCH)  TROPONIN I (HIGH SENSITIVITY)    EKG EKG Interpretation  Date/Time:  Friday April 09 2021 11:39:47 EDT Ventricular Rate:  140 PR Interval:    QRS Duration: 130 QT Interval:  331 QTC Calculation: 506 R Axis:   77 Text Interpretation: Wide-QRS tachycardia Left bundle branch block Since last tracing rate faster Confirmed by Isla Pence [phone]) on 04/09/2021 11:50:22 AM  Radiology DG Chest Port 1 View  Result Date: 04/09/2021 CLINICAL DATA:  sob EXAM: PORTABLE CHEST 1 VIEW COMPARISON:  03/15/2021. FINDINGS: Similar left greater than right basilar opacities. Probable small bilateral pleural effusions. No visible pneumothorax on this semi erect radiograph. Similar mild enlargement the cardiac silhouette. Median sternotomy and CABG. Similar pulmonary vascular congestion. IMPRESSION: 1. Similar probable small bilateral pleural effusions with overlying left greater than right basilar atelectasis and/or consolidation. 2. Similar cardiomegaly and pulmonary vascular congestion. Electronically Signed   By: Margaretha Sheffield M.D.   On: 04/09/2021 12:31    Procedures Procedures   Medications Ordered in ED Medications  diltiazem (CARDIZEM) 1 mg/mL load via infusion 20 mg (20 mg Intravenous Bolus from Bag 04/09/21 1417)    And  diltiazem (CARDIZEM) 125 mg in dextrose 5% 125 mL (1 mg/mL) infusion (5 mg/hr Intravenous New Bag/Given 04/09/21 1417)  Chlorhexidine Gluconate Cloth 2 % PADS 6 each (has no administration in time range)  0.9 %  sodium chloride infusion (Manually program via Guardrails IV Fluids) (has no administration in time range)  metoprolol tartrate (LOPRESSOR) injection 5 mg (5 mg Intravenous Given 04/09/21 1213)  0.9 %  sodium chloride infusion (10 mL/hr Intravenous New Bag/Given 04/09/21 1433)    ED Course  I have reviewed the triage vital signs and the nursing notes.  Pertinent labs & imaging results that were  available during my care of the patient were reviewed by me and considered in my medical decision making (see chart for details).    MDM Rules/Calculators/A&P                           HR initially in the 140s.  Pt said she took her normal meds today.  BP ok, so she was given 5 mg lopressor iv.  This brought her hr down to the 90s, but afib.  HR has come back up to the upper 120s, so she was started on a dilt drip.  She was on amiodarone at one time, but it caused thyroid dysfunction, so that was stopped.  Troponin is elevated.  Pt does not have cp.  Likely from strain.  The dilt drip caused pt's HR to drop to 30.  Pt felt dizzy and like she was going to "leave this world."  Drip stopped and HR back to 60s.  Pt is sob and weak.  This could also be due to hgb dropping down from 9.5 on 9/5 to 6.9 now. 1 unit of prbcs ordered for transfusion.  This has not yet been given as she has a hx of antibodies.  CHA2DS2/VAS Stroke Risk Points  Current as of 12 minutes ago     7 >= 2 Points: High Risk  1 - 1.99 Points: Medium Risk  0 Points: Low Risk    Last Change: N/A      Details    This score determines the patient's risk of having a stroke if the  patient has atrial fibrillation.       Points Metrics  1 Has Congestive Heart Failure:  Yes    Current as of 12 minutes ago  1 Has Vascular Disease:  Yes    Current as of 12 minutes ago  1 Has Hypertension:  Yes    Current as of 12 minutes ago  2 Age:  76    Current as of 12 minutes ago  1 Has Diabetes:  Yes    Current as of 12 minutes ago  0 Had Stroke:  No  Had TIA:  No  Had Thromboembolism:  No    Current as of 12 minutes ago  1 Female:  Yes    Current as of 12 minutes ago     No anticoagulation due to GIBs.   Pt d/w Dr. Posey Pronto (nephrology) who will let the dialysis nurse know so they can dialyze patient.  Pt d/w Dr. Dyann Kief ([facility]) for admission.    CRITICAL CARE Performed by: Isla Pence   Total critical care time: 30  minutes  Critical care time was exclusive of separately billable procedures and treating other patients.  Critical care was necessary to treat or prevent imminent or life-threatening deterioration.  Critical care was time spent personally by me on the following activities: development of treatment plan with patient and/or surrogate as well as nursing, discussions with consultants, evaluation of patient's response to treatment, examination of patient, obtaining history from patient or surrogate, ordering and performing treatments and interventions, ordering and review of laboratory studies, ordering and review of radiographic studies, pulse oximetry and re-evaluation of patient's condition.    Final Clinical Impression(s) / ED Diagnoses Final diagnoses:  Atrial fibrillation with RVR (HCC)  Symptomatic anemia  Elevated troponin  ESRD on hemodialysis ([place])    Rx / DC Orders ED Discharge Orders     None  Isla Pence, MD 04/09/21 1435

## 2021-04-10 DIAGNOSIS — D649 Anemia, unspecified: Secondary | ICD-10-CM | POA: Diagnosis not present

## 2021-04-10 LAB — CBC
HCT: 30.5 % — ABNORMAL LOW (ref 36.0–46.0)
Hemoglobin: 9.4 g/dL — ABNORMAL LOW (ref 12.0–15.0)
MCH: 34.2 pg — ABNORMAL HIGH (ref 26.0–34.0)
MCHC: 30.8 g/dL (ref 30.0–36.0)
MCV: 110.9 fL — ABNORMAL HIGH (ref 80.0–100.0)
Platelets: 276 10*3/uL (ref 150–400)
RBC: 2.75 MIL/uL — ABNORMAL LOW (ref 3.87–5.11)
RDW: 26.5 % — ABNORMAL HIGH (ref 11.5–15.5)
WBC: 10.9 10*3/uL — ABNORMAL HIGH (ref 4.0–10.5)
nRBC: 0 % (ref 0.0–0.2)

## 2021-04-10 LAB — MRSA NEXT GEN BY PCR, NASAL: MRSA by PCR Next Gen: NOT DETECTED

## 2021-04-10 LAB — T4, FREE: Free T4: 0.98 ng/dL (ref 0.61–1.12)

## 2021-04-10 LAB — HEPATITIS B SURFACE ANTIBODY, QUANTITATIVE: Hep B S AB Quant (Post): 3.3 m[IU]/mL — ABNORMAL LOW (ref >9.9)

## 2021-04-10 NOTE — Progress Notes (Signed)
Pt discharged via WC to POV. Discharge instructions reviewed with pt and her husband, husband states understanding and give good verbal return of info. Pt will all belongings in her possession.

## 2021-04-10 NOTE — Care Management Obs Status (Signed)
[facility] NOTIFICATION   Patient Details  Name: JETAIME PINNIX MRN: [id number] Date of Birth: 09/24/1940   Medicare Observation Status Notification Given:  Yes    Boneta Lucks, RN 04/10/2021, 12:27 PM

## 2021-04-10 NOTE — Discharge Summary (Signed)
Physician Discharge Summary  Shawna [name] JJO:[id number] DOB: 21-Oct-1940 DOA: 04/09/2021  PCP: [facility]  Admit date: 04/09/2021 Discharge date: 04/10/2021  Admitted From: Home Disposition: Home  Recommendations for Outpatient Follow-up:  Follow up with PCP in 1-2 weeks Please obtain CBC in one week to assess hemoglobin level  Home Health: No Equipment/Devices: None stable  Discharge Condition:  CODE STATUS: Full code Diet recommendation: Renal diet  History of present illness:  Shawna [name] is a 80 y.o. female with medical history significant of ESRD, hypertension, chronic anemia, history of AVN of the colon, chronic diastolic heart failure, gastroesophageal reflux disease, hyperlipidemia and paroxysmal atrial fibrillation (not on chronic anticoagulation); who presented to the emergency department secondary to left lower extremity pain and generalized weakness.  Patient was in the process to go to her dialysis as she normally would do (MWF), when she felt significantly weak and noticing worsening left leg pain.  Pain is not radiated, constant and 6 out of 10 in intensity.  Patient denies chest pain, shortness of breath, nausea, vomiting, melena, hematochezia, hematemesis, abdominal pain, or any other complaints.   Patient is vaccinated against COVID but not posted.   In the ED, patient was found to be significantly anemic with a hemoglobin of 6.9 and also with mild elevated troponin.  No acute ischemic changes seen on EKG and no acute cardiopulmonary process.  1 unit of PRBC has been ordered and nephrology service was consulted for dialysis.  Hospital course:  Acute on chronic anemia/symptomatic anemia:  Patient with history of chronic blood loss and anemia of chronic kidney disease.  Hemoglobin on admission 6.9.  No overt bleeding reported or appreciated currently.  Negative fecal occult blood test.  Patient was transfused 1 unit PRBCs during hospitalization with  improvement of hemoglobin to 9.4 at time of discharge.  Recommend repeat CBC 1 week.   Paroxysmal atrial fibrillation Patient with transient episode of A. fib with RVR on presentation.  Initially started on IV Cardizem drip with significant bradycardic events which was discontinued.  Following hemodialysis and blood transfusion, heart rate remained stable.  Continue home metoprolol tartrate 12.5 mg p.o. twice daily.  Not on chronic anticoagulation secondary to history of chronic GI bleed.   Chronic diastolic heart failure Overall compensated, continue volume management with hemodialysis.  Continue beta-blocker.  Recommend monitoring of weights outpatient.   Gastroesophageal reflux disease: Continue omeprazole   Essential hypertension Continue Imdur and metoprolol.   Hypothyroidism Continue Synthroid 25 mcg p.o. daily.  Continue outpatient follow-up with endocrinology.   Hyperlipidemia: continue statin   Type 2 diabetes mellitus non-insulin-dependent with nephropathy Diet controlled.  Outpatient follow-up with PCP   Elevated troponin Etiology likely secondary to type II demand ischemia/strain in the setting of acute transient A. fib with RVR coupled with volume overload from missed hemodialysis.  Recent TTE with no regional wall motion normalities.  EKG with no dynamic changes.  Chest pain-free.  On Imdur as above.  Discharge Diagnoses:  Active Problems:   * No active hospital problems. *    Discharge Instructions  Discharge Instructions     Call MD for:  difficulty breathing, headache or visual disturbances   Complete by: As directed    Call MD for:  extreme fatigue   Complete by: As directed    Call MD for:  persistant dizziness or light-headedness   Complete by: As directed    Call MD for:  persistant nausea and vomiting   Complete by: As directed  Call MD for:  severe uncontrolled pain   Complete by: As directed    Call MD for:  temperature >100.4   Complete by: As  directed    Diet - low sodium heart healthy   Complete by: As directed    Increase activity slowly   Complete by: As directed       Allergies as of 04/10/2021       Reactions   Aspirin Other (See Comments)   High Doses Mess up her stomach; "makes my bowels have blood in them". Takes 81 mg EC Aspirin    Penicillins Other (See Comments)   SYNCOPE? , "makes me real weak when I take it; like I'll pass out" Has patient had a PCN reaction causing immediate rash, facial/tongue/throat swelling, SOB or lightheadedness with hypotension: Yes Has patient had a PCN reaction causing severe rash involving mucus membranes or skin necrosis: no Has patient had a PCN reaction that required hospitalization no Has patient had a PCN reaction occurring within the last 10 years: no If all of the above   Amlodipine Swelling   Bactrim [sulfamethoxazole-trimethoprim] Rash   Contrast Media [iodinated Diagnostic Agents] Itching   Iron Itching, Other (See Comments)   "they gave me iron in dialysis; had to give me Benadryl cause I had to have the iron" (05/02/2012)   Nitrofurantoin Hives   Tylenol [acetaminophen] Itching, Other (See Comments)   Makes her feet on fire per pt   Gabapentin Other (See Comments)   Unknown reaction   Hydralazine Itching   Has tolerated while inpatient   Levofloxacin    No Known Allergies    Ranexa [ranolazine] Other (See Comments)   Myoclonus-hospitalized    Dexilant [dexlansoprazole] Other (See Comments)   Upset stomach   Levaquin [levofloxacin In D5w] Rash   Morphine And Related Itching, Other (See Comments)   Itching in feet   Plavix [clopidogrel Bisulfate] Rash   Protonix [pantoprazole Sodium] Rash   Venofer [ferric Oxide] Itching, Other (See Comments)   Patient reports using Benadryl prior to doses as [place]        Medication List     STOP taking these medications    loratadine 10 MG tablet Commonly known as: CLARITIN       TAKE these medications     ALPRAZolam 0.5 MG tablet Commonly known as: XANAX Take 0.5 mg by mouth daily as needed.   aspirin EC 81 MG tablet Take 81 mg by mouth daily.   atorvastatin 10 MG tablet Commonly known as: LIPITOR Take 10 mg by mouth daily.   diclofenac Sodium 1 % Gel Commonly known as: Voltaren Apply 2 g topically 4 (four) times daily as needed. What changed: reasons to take this   fluticasone 50 MCG/ACT nasal spray Commonly known as: FLONASE Place 1 spray into both nostrils at bedtime as needed for allergies.   hydrOXYzine 100 MG capsule Commonly known as: VISTARIL Take 100 mg by mouth daily as needed for anxiety or itching.   isosorbide mononitrate 60 MG 24 hr tablet Commonly known as: IMDUR Take 1 tablet (60 mg total) by mouth in the morning and at bedtime.   levothyroxine 25 MCG tablet Commonly known as: SYNTHROID Take 1 tablet (25 mcg total) by mouth daily.   lidocaine-prilocaine cream Commonly known as: EMLA Apply 1 application topically every Monday, Wednesday, and Friday. Prior to dialysis   loperamide 2 MG tablet Commonly known as: IMODIUM A-D Take 2 tablets (4 mg total) by mouth daily  as needed for diarrhea or loose stools.   metoprolol tartrate 25 MG tablet Commonly known as: LOPRESSOR Take 0.5 tablets (12.5 mg total) by mouth 2 (two) times daily.   midodrine 5 MG tablet Commonly known as: PROAMATINE Take Monday, Wednesday, Friday morning BEFORE dialysis   multivitamin Tabs tablet Take 1 tablet by mouth daily.   nitroGLYCERIN 0.4 MG SL tablet Commonly known as: NITROSTAT Place 1 tablet (0.4 mg total) under the tongue every 5 (five) minutes x 3 doses as needed for chest pain (if no relief after 2nd dose, proceed to the ED for an evaluation or call 911).   omeprazole 20 MG capsule Commonly known as: PRILOSEC Take 1 capsule (20 mg total) by mouth daily.   sevelamer carbonate 800 MG tablet Commonly known as: RENVELA Take 2 tablets (1,600 mg total) by mouth 3  (three) times daily with meals.   simvastatin 20 MG tablet Commonly known as: ZOCOR Take 20 mg by mouth daily.        Follow-up Information     [facility]. Schedule an appointment as soon as possible for a visit in 1 week(s).   Why: Repeat CBC at visit to assess hemoglobin level Contact information: [place] [address] [phone]                Allergies  Allergen Reactions   Aspirin Other (See Comments)    High Doses Mess up her stomach; "makes my bowels have blood in them". Takes 81 mg EC Aspirin    Penicillins Other (See Comments)    SYNCOPE? , "makes me real weak when I take it; like I'll pass out"  Has patient had a PCN reaction causing immediate rash, facial/tongue/throat swelling, SOB or lightheadedness with hypotension: Yes Has patient had a PCN reaction causing severe rash involving mucus membranes or skin necrosis: no Has patient had a PCN reaction that required hospitalization no Has patient had a PCN reaction occurring within the last 10 years: no If all of the above   Amlodipine Swelling   Bactrim [Sulfamethoxazole-Trimethoprim] Rash   Contrast Media [Iodinated Diagnostic Agents] Itching   Iron Itching and Other (See Comments)    "they gave me iron in dialysis; had to give me Benadryl cause I had to have the iron" (05/02/2012)   Nitrofurantoin Hives   Tylenol [Acetaminophen] Itching and Other (See Comments)    Makes her feet on fire per pt   Gabapentin Other (See Comments)    Unknown reaction   Hydralazine Itching    Has tolerated while inpatient   Levofloxacin    No Known Allergies    Ranexa [Ranolazine] Other (See Comments)    Myoclonus-hospitalized    Dexilant [Dexlansoprazole] Other (See Comments)    Upset stomach   Levaquin [Levofloxacin In D5w] Rash   Morphine And Related Itching and Other (See Comments)    Itching in feet   Plavix [Clopidogrel Bisulfate] Rash   Protonix [Pantoprazole Sodium] Rash   Venofer  [Ferric Oxide] Itching and Other (See Comments)    Patient reports using Benadryl prior to doses as [place]    Consultations: Nephrology   Procedures/Studies: DG Chest 2 View  Result Date: 03/12/2021 CLINICAL DATA:  Fall with pain in the chest EXAM: CHEST - 2 VIEW COMPARISON:  02/27/2021 FINDINGS: Diffuse interstitial prominence compared to prior studies, likely with cephalized flow. Streaky density at the lung bases which has been seen on recent priors. No convincing change from prior. Mild cardiomegaly.  Prior CABG. IMPRESSION: 1. Vascular congestion/edema with small pleural effusions. 2. Chronic bronchitic markings at the bases. Electronically Signed   By: Monte Fantasia M.D.   On: 03/12/2021 04:37   US Venous Img Lower Unilateral Left  Result Date: 04/09/2021 CLINICAL DATA:  Shortness of breath. Left lower extremity pain and swelling. EXAM: LEFT LOWER EXTREMITY VENOUS DOPPLER ULTRASOUND TECHNIQUE: Gray-scale sonography with graded compression, as well as color Doppler and duplex ultrasound were performed to evaluate the lower extremity deep venous systems from the level of the common femoral vein and including the common femoral, femoral, profunda femoral, popliteal and calf veins including the posterior tibial, peroneal and gastrocnemius veins when visible. The superficial great saphenous vein was also interrogated. Spectral Doppler was utilized to evaluate flow at rest and with distal augmentation maneuvers in the common femoral, femoral and popliteal veins. COMPARISON:  None. FINDINGS: Contralateral Common Femoral Vein: Respiratory phasicity is normal and symmetric with the symptomatic side. No evidence of thrombus. Normal compressibility. Common Femoral Vein: No evidence of thrombus. Normal compressibility, respiratory phasicity and response to augmentation. Saphenofemoral Junction: No evidence of thrombus. Normal compressibility and flow on color Doppler imaging. Profunda Femoral Vein:  No evidence of thrombus. Normal compressibility and flow on color Doppler imaging. Femoral Vein: No evidence of thrombus. Normal compressibility, respiratory phasicity and response to augmentation. Popliteal Vein: No evidence of thrombus. Normal compressibility, respiratory phasicity and response to augmentation. Calf Veins: Visualized left deep calf veins are patent without thrombus. Superficial Great Saphenous Vein: No evidence of thrombus. Normal compressibility. Other Findings: Small fluid collection in the left popliteal fossa that measures 1.8 x 0.6 x 1.6 cm. Normal compressibility in the visualized left short saphenous vein. Subcutaneous edema in the left ankle and calf. IMPRESSION: 1.  Negative for deep venous thrombosis in left lower extremity. 2. Small left knee Baker's cyst. Electronically Signed   By: Markus Daft M.D.   On: 04/09/2021 13:57   DG Chest Port 1 View  Result Date: 04/09/2021 CLINICAL DATA:  sob EXAM: PORTABLE CHEST 1 VIEW COMPARISON:  03/15/2021. FINDINGS: Similar left greater than right basilar opacities. Probable small bilateral pleural effusions. No visible pneumothorax on this semi erect radiograph. Similar mild enlargement the cardiac silhouette. Median sternotomy and CABG. Similar pulmonary vascular congestion. IMPRESSION: 1. Similar probable small bilateral pleural effusions with overlying left greater than right basilar atelectasis and/or consolidation. 2. Similar cardiomegaly and pulmonary vascular congestion. Electronically Signed   By: Margaretha Sheffield M.D.   On: 04/09/2021 12:31   DG Chest Port 1 View  Result Date: 03/15/2021 CLINICAL DATA:  Chest pain EXAM: PORTABLE CHEST 1 VIEW COMPARISON:  03/12/2021 FINDINGS: Cardiomegaly with vascular congestion. Prior CABG. Bibasilar airspace opacities with small effusions, similar to prior study. No overt edema. No acute bony abnormality. IMPRESSION: Cardiomegaly, vascular congestion. Bibasilar atelectasis or infiltrates with small  effusions. Electronically Signed   By: Rolm Baptise M.D.   On: 03/15/2021 01:28   DG Knee Complete 4 Views Left  Result Date: 03/15/2021 CLINICAL DATA:  Left leg pain EXAM: LEFT KNEE - COMPLETE 4+ VIEW COMPARISON:  None. FINDINGS: No acute bony abnormality. Specifically, no fracture, subluxation, or dislocation. No joint effusion. Joint spaces maintained. Vascular calcifications noted. IMPRESSION: No acute bony abnormality. Electronically Signed   By: Rolm Baptise M.D.   On: 03/15/2021 01:27   DG Knee Complete 4 Views Left  Result Date: 03/12/2021 CLINICAL DATA:  Fall.  Knee pain. EXAM: LEFT KNEE - COMPLETE 4+ VIEW COMPARISON:  None. FINDINGS: No  evidence for an acute fracture. No subluxation or dislocation. No joint effusion. Bones are demineralized. Surgical clips are noted in the medial soft tissues adjacent to the proximal tibia. Meniscal calcification evident. IMPRESSION: 1. No acute bony abnormality. 2. Meniscal calcification. Chondrocalcinosis can be associated with CPPD. Electronically Signed   By: Misty Stanley M.D.   On: 03/12/2021 05:06   ECHOCARDIOGRAM COMPLETE  Result Date: 03/24/2021    ECHOCARDIOGRAM REPORT   Patient Name:   SOPHIAH ROLIN Date of Exam: 03/15/2021 Medical Rec #:  [id number]    Height:       61.0 in Accession #:    [id number]   Weight:       104.9 lb Date of Birth:  August 25, 1940    BSA:          1.436 m Patient Age:    71 years     BP:           167/55 mmHg Patient Gender: F            HR:           73 bpm. Exam Location:  [facility] Procedure: 2D Echo, Cardiac Doppler and Color Doppler Indications:    Chest Pain  History:        Patient has prior history of Echocardiogram examinations, most                 recent 12/14/2018. CHF, Previous Myocardial Infarction and CAD,                 Prior CABG, Arrythmias:LBBB, Signs/Symptoms:Dyspnea; Risk                 Factors:Hypertension and Dyslipidemia.  Sonographer:    Wenda Low Referring Phys: [id number] [place]   1. Left ventricular ejection fraction, by estimation, is 50%. The left ventricle has low normal function. The left ventricle has no regional wall motion abnormalities. There is mild left ventricular hypertrophy. Left ventricular diastolic parameters are  consistent with Grade II diastolic dysfunction (pseudonormalization). Elevated left atrial pressure.  2. Right ventricular systolic function is normal. The right ventricular size is normal. There is moderately elevated pulmonary artery systolic pressure.  3. Left atrial size was severely dilated.  4. Right atrial size was mildly dilated.  5. The mitral valve is abnormal. Moderate mitral valve regurgitation. Mild mitral stenosis. Moderate mitral annular calcification.  6. The tricuspid valve is abnormal. Tricuspid valve regurgitation is moderate.  7. The aortic valve is tricuspid. There is moderate calcification of the aortic valve. There is moderate thickening of the aortic valve. Aortic valve regurgitation is not visualized. No aortic stenosis is present.  8. The inferior vena cava is normal in size with greater than 50% respiratory variability, suggesting right atrial pressure of 3 mmHg. FINDINGS  Left Ventricle: Left ventricular ejection fraction, by estimation, is 50%. The left ventricle has low normal function. The left ventricle has no regional wall motion abnormalities. The left ventricular internal cavity size was normal in size. There is mild left ventricular hypertrophy. Left ventricular diastolic parameters are consistent with Grade II diastolic dysfunction (pseudonormalization). Elevated left atrial pressure. Right Ventricle: The right ventricular size is normal. Right vetricular wall thickness was not well visualized. Right ventricular systolic function is normal. There is moderately elevated pulmonary artery systolic pressure. The tricuspid regurgitant velocity is 3.68 m/s, and with an assumed right atrial pressure of 3 mmHg, the estimated right  ventricular systolic pressure is 78.4 mmHg. Left Atrium:  Left atrial size was severely dilated. Right Atrium: Right atrial size was mildly dilated. Pericardium: There is no evidence of pericardial effusion. Mitral Valve: The mitral valve is abnormal. There is moderate thickening of the mitral valve leaflet(s). There is moderate calcification of the mitral valve leaflet(s). Moderate mitral annular calcification. Moderate mitral valve regurgitation. Mild mitral valve stenosis. MV peak gradient, 15.5 mmHg. The mean mitral valve gradient is 4.0 mmHg. Tricuspid Valve: The tricuspid valve is abnormal. Tricuspid valve regurgitation is moderate . No evidence of tricuspid stenosis. Aortic Valve: The aortic valve is tricuspid. There is moderate calcification of the aortic valve. There is moderate thickening of the aortic valve. There is moderate aortic valve annular calcification. Aortic valve regurgitation is not visualized. No aortic stenosis is present. Aortic valve mean gradient measures 5.0 mmHg. Aortic valve peak gradient measures 11.0 mmHg. Aortic valve area, by VTI measures 1.85 cm. Pulmonic Valve: The pulmonic valve was not well visualized. Pulmonic valve regurgitation is mild. No evidence of pulmonic stenosis. Aorta: The aortic root is normal in size and structure. Venous: The inferior vena cava is normal in size with greater than 50% respiratory variability, suggesting right atrial pressure of 3 mmHg. IAS/Shunts: No atrial level shunt detected by color flow Doppler.  LEFT VENTRICLE PLAX 2D LVIDd:         4.10 cm     Diastology LVIDs:         3.05 cm     LV e' medial:    4.95 cm/s LV PW:         1.17 cm     LV E/e' medial:  35.2 LV IVS:        1.15 cm     LV e' lateral:   9.00 cm/s LVOT diam:     2.00 cm     LV E/e' lateral: 19.3 LV SV:         72 LV SV Index:   50 LVOT Area:     3.14 cm  LV Volumes (MOD) LV vol d, MOD A2C: 78.6 ml LV vol d, MOD A4C: 59.9 ml LV vol s, MOD A2C: 39.8 ml LV vol s, MOD A4C: 31.8 ml LV  SV MOD A2C:     38.8 ml LV SV MOD A4C:     59.9 ml LV SV MOD BP:      34.3 ml RIGHT VENTRICLE RV Basal diam:  3.14 cm RV Mid diam:    2.96 cm RV S prime:     11.30 cm/s TAPSE (M-mode): 1.7 cm LEFT ATRIUM             Index       RIGHT ATRIUM           Index LA diam:        4.10 cm 2.86 cm/m  RA Area:     16.30 cm LA Vol (A2C):   81.4 ml 56.69 ml/m RA Volume:   42.40 ml  29.53 ml/m LA Vol (A4C):   69.7 ml 48.54 ml/m LA Biplane Vol: 77.0 ml 53.62 ml/m  AORTIC VALVE AV Area (Vmax):    1.80 cm AV Area (Vmean):   1.93 cm AV Area (VTI):     1.85 cm AV Vmax:           166.00 cm/s AV Vmean:          105.000 cm/s AV VTI:            0.391 m AV Peak Grad:  11.0 mmHg AV Mean Grad:      5.0 mmHg LVOT Vmax:         95.10 cm/s LVOT Vmean:        64.500 cm/s LVOT VTI:          0.230 m LVOT/AV VTI ratio: 0.59  AORTA Ao Root diam: 2.90 cm MITRAL VALVE                 TRICUSPID VALVE MV Area (PHT): 4.21 cm      TR Peak grad:   54.2 mmHg MV Area VTI:   1.56 cm      TR Vmax:        368.00 cm/s MV Peak grad:  15.5 mmHg MV Mean grad:  4.0 mmHg      SHUNTS MV Vmax:       1.97 m/s      Systemic VTI:  0.23 m MV Vmean:      93.7 cm/s     Systemic Diam: 2.00 cm MV Decel Time: 180 msec MR Peak grad:    133.2 mmHg MR Mean grad:    90.0 mmHg MR Vmax:         577.00 cm/s MR Vmean:        451.0 cm/s MR PISA:         1.57 cm MR PISA Eff ROA: 25 mm MR PISA Radius:  0.50 cm MV E velocity: 174.00 cm/s MV A velocity: 93.00 cm/s MV E/A ratio:  1.87 Carlyle Dolly MD Electronically signed by Carlyle Dolly MD Signature Date/Time: 03/24/2021/11:45:01 AM    Final      Subjective: Patient seen examined bedside, resting comfortably.  Feels less fatigued this morning.  RN present at bedside this morning.  Hemoglobin stable 9.4.  Ready for discharge home.  No other questions or concerns at this time.  Denies headache, no dizziness, no chest pain, no palpitations, no shortness of breath, no abdominal pain, no weakness, no fatigue, no  paresthesias.  No acute events overnight per nursing staff.  Discharge Exam: Vitals:   04/10/21 0515 04/10/21 0902  BP: (!) 152/60 (!) 128/54  Pulse: 90 84  Resp: 20 20  Temp: 97.7 F (36.5 C)   SpO2: 100% 100%   Vitals:   04/10/21 0219 04/10/21 0452 04/10/21 0515 04/10/21 0902  BP: (!) 154/56  (!) 152/60 (!) 128/54  Pulse: 92  90 84  Resp: (!) 22  20 20   Temp: 97.8 F (36.6 C)  97.7 F (36.5 C)   TempSrc: Axillary  Oral   SpO2: 100%  100% 100%  Weight:  56.3 kg    Height:        General: Pt is alert, awake, not in acute distress Cardiovascular: RRR, S1/S2 +, no rubs, no gallops Respiratory: CTA bilaterally, no wheezing, no rhonchi, on room air Abdominal: Soft, NT, ND, bowel sounds + Extremities: no edema, no cyanosis    The results of significant diagnostics from this hospitalization (including imaging, microbiology, ancillary and laboratory) are listed below for reference.     Microbiology: Recent Results (from the past 240 hour(s))  Resp Panel by RT-PCR (Flu A&B, Covid) Nasopharyngeal Swab     Status: None   Collection Time: 04/09/21  3:00 PM   Specimen: Nasopharyngeal Swab; Nasopharyngeal(NP) swabs in vial transport medium  Result Value Ref Range Status   SARS Coronavirus 2 by RT PCR NEGATIVE NEGATIVE Final    Comment: (NOTE) SARS-CoV-2 target nucleic acids are NOT DETECTED.  The SARS-CoV-2 RNA  is generally detectable in upper respiratory specimens during the acute phase of infection. The lowest concentration of SARS-CoV-2 viral copies this assay can detect is 138 copies/mL. A negative result does not preclude SARS-Cov-2 infection and should not be used as the sole basis for treatment or other patient management decisions. A negative result may occur with  improper specimen collection/handling, submission of specimen other than nasopharyngeal swab, presence of viral mutation(s) within the areas targeted by this assay, and inadequate number of  viral copies(<138 copies/mL). A negative result must be combined with clinical observations, patient history, and epidemiological information. The expected result is Negative.  Fact Sheet for Patients:  EntrepreneurPulse.com.au  Fact Sheet for Healthcare Providers:  IncredibleEmployment.be  This test is no t yet approved or cleared by the [place] FDA and  has been authorized for detection and/or diagnosis of SARS-CoV-2 by FDA under an Emergency Use Authorization (EUA). This EUA will remain  in effect (meaning this test can be used) for the duration of the COVID-19 declaration under Section 564(b)(1) of the Act, 21 U.S.C.section 360bbb-3(b)(1), unless the authorization is terminated  or revoked sooner.       Influenza A by PCR NEGATIVE NEGATIVE Final   Influenza B by PCR NEGATIVE NEGATIVE Final    Comment: (NOTE) The Xpert Xpress SARS-CoV-2/FLU/RSV plus assay is intended as an aid in the diagnosis of influenza from Nasopharyngeal swab specimens and should not be used as a sole basis for treatment. Nasal washings and aspirates are unacceptable for Xpert Xpress SARS-CoV-2/FLU/RSV testing.  Fact Sheet for Patients: EntrepreneurPulse.com.au  Fact Sheet for Healthcare Providers: IncredibleEmployment.be  This test is not yet approved or cleared by the [place] FDA and has been authorized for detection and/or diagnosis of SARS-CoV-2 by FDA under an Emergency Use Authorization (EUA). This EUA will remain in effect (meaning this test can be used) for the duration of the COVID-19 declaration under Section 564(b)(1) of the Act, 21 U.S.C. section 360bbb-3(b)(1), unless the authorization is terminated or revoked.  Performed at [facility], [address], [place], [place] [address]   MRSA Next Gen by PCR, Nasal     Status: None   Collection Time: 04/09/21 11:10 PM   Specimen: Nasal Mucosa; Nasal Swab   Result Value Ref Range Status   MRSA by PCR Next Gen NOT DETECTED NOT DETECTED Final    Comment: (NOTE) The GeneXpert MRSA Assay (FDA approved for NASAL specimens only), is one component of a comprehensive MRSA colonization surveillance program. It is not intended to diagnose MRSA infection nor to guide or monitor treatment for MRSA infections. Test performance is not FDA approved in patients less than 73 years old. Performed at [facility], [address], [place],  [address]      Labs: BNP (last 3 results) Recent Labs    09/29/20 0112  BNP 619.5*   Basic Metabolic Panel: Recent Labs  Lab 04/09/21 1212 04/09/21 1335  NA 138  --   K 4.0  --   CL 100  --   CO2 28  --   GLUCOSE 141*  --   BUN 39*  --   CREATININE 7.34*  --   CALCIUM 9.2  --   MG  --  1.7  PHOS  --  2.2*   Liver Function Tests: No results for input(s): AST, ALT, ALKPHOS, BILITOT, PROT, ALBUMIN in the last 168 hours. No results for input(s): LIPASE, AMYLASE in the last 168 hours. No results for input(s): AMMONIA in the last  168 hours. CBC: Recent Labs  Lab 04/09/21 1212 04/10/21 0631  WBC 6.9 10.9*  HGB 6.9* 9.4*  HCT 22.4* 30.5*  MCV 121.7* 110.9*  PLT 305 276   Cardiac Enzymes: No results for input(s): CKTOTAL, CKMB, CKMBINDEX, TROPONINI in the last 168 hours. BNP: Invalid input(s): POCBNP CBG: No results for input(s): GLUCAP in the last 168 hours. D-Dimer No results for input(s): DDIMER in the last 72 hours. Hgb A1c No results for input(s): HGBA1C in the last 72 hours. Lipid Profile No results for input(s): CHOL, HDL, LDLCALC, TRIG, CHOLHDL, LDLDIRECT in the last 72 hours. Thyroid function studies Recent Labs    04/09/21 1212  TSH 4.509*   Anemia work up No results for input(s): VITAMINB12, FOLATE, FERRITIN, TIBC, IRON, RETICCTPCT in the last 72 hours. Urinalysis    Component Value Date/Time   COLORURINE YELLOW 12/16/2012 1919   APPEARANCEUR CLOUDY (A) 12/16/2012 1919    LABSPEC 1.009 12/16/2012 1919   PHURINE 7.5 12/16/2012 1919   GLUCOSEU NEGATIVE 12/16/2012 1919   HGBUR TRACE (A) 12/16/2012 1919   BILIRUBINUR NEGATIVE 12/16/2012 1919   KETONESUR NEGATIVE 12/16/2012 1919   PROTEINUR 100 (A) 12/16/2012 1919   UROBILINOGEN 0.2 12/16/2012 1919   NITRITE NEGATIVE 12/16/2012 1919   LEUKOCYTESUR SMALL (A) 12/16/2012 1919   Sepsis Labs Invalid input(s): PROCALCITONIN,  WBC,  LACTICIDVEN Microbiology Recent Results (from the past 240 hour(s))  Resp Panel by RT-PCR (Flu A&B, Covid) Nasopharyngeal Swab     Status: None   Collection Time: 04/09/21  3:00 PM   Specimen: Nasopharyngeal Swab; Nasopharyngeal(NP) swabs in vial transport medium  Result Value Ref Range Status   SARS Coronavirus 2 by RT PCR NEGATIVE NEGATIVE Final    Comment: (NOTE) SARS-CoV-2 target nucleic acids are NOT DETECTED.  The SARS-CoV-2 RNA is generally detectable in upper respiratory specimens during the acute phase of infection. The lowest concentration of SARS-CoV-2 viral copies this assay can detect is 138 copies/mL. A negative result does not preclude SARS-Cov-2 infection and should not be used as the sole basis for treatment or other patient management decisions. A negative result may occur with  improper specimen collection/handling, submission of specimen other than nasopharyngeal swab, presence of viral mutation(s) within the areas targeted by this assay, and inadequate number of viral copies(<138 copies/mL). A negative result must be combined with clinical observations, patient history, and epidemiological information. The expected result is Negative.  Fact Sheet for Patients:  EntrepreneurPulse.com.au  Fact Sheet for Healthcare Providers:  IncredibleEmployment.be  This test is no t yet approved or cleared by the [place] FDA and  has been authorized for detection and/or diagnosis of SARS-CoV-2 by FDA under an Emergency Use  Authorization (EUA). This EUA will remain  in effect (meaning this test can be used) for the duration of the COVID-19 declaration under Section 564(b)(1) of the Act, 21 U.S.C.section 360bbb-3(b)(1), unless the authorization is terminated  or revoked sooner.       Influenza A by PCR NEGATIVE NEGATIVE Final   Influenza B by PCR NEGATIVE NEGATIVE Final    Comment: (NOTE) The Xpert Xpress SARS-CoV-2/FLU/RSV plus assay is intended as an aid in the diagnosis of influenza from Nasopharyngeal swab specimens and should not be used as a sole basis for treatment. Nasal washings and aspirates are unacceptable for Xpert Xpress SARS-CoV-2/FLU/RSV testing.  Fact Sheet for Patients: EntrepreneurPulse.com.au  Fact Sheet for Healthcare Providers: IncredibleEmployment.be  This test is not yet approved or cleared by the [place] and has been authorized  for detection and/or diagnosis of SARS-CoV-2 by FDA under an Emergency Use Authorization (EUA). This EUA will remain in effect (meaning this test can be used) for the duration of the COVID-19 declaration under Section 564(b)(1) of the Act, 21 U.S.C. section 360bbb-3(b)(1), unless the authorization is terminated or revoked.  Performed at [facility], [address], [place], [place] [address]   MRSA Next Gen by PCR, Nasal     Status: None   Collection Time: 04/09/21 11:10 PM   Specimen: Nasal Mucosa; Nasal Swab  Result Value Ref Range Status   MRSA by PCR Next Gen NOT DETECTED NOT DETECTED Final    Comment: (NOTE) The GeneXpert MRSA Assay (FDA approved for NASAL specimens only), is one component of a comprehensive MRSA colonization surveillance program. It is not intended to diagnose MRSA infection nor to guide or monitor treatment for MRSA infections. Test performance is not FDA approved in patients less than 85 years old. Performed at [facility], [address], [place], [place] [address]       Time coordinating discharge: Over 30 minutes  SIGNED:   Jacon Whetzel J [place] DO  [facility] 04/10/2021, 10:19 AM

## 2021-04-13 ENCOUNTER — Other Ambulatory Visit: Payer: Self-pay

## 2021-04-13 ENCOUNTER — Encounter (INDEPENDENT_AMBULATORY_CARE_PROVIDER_SITE_OTHER): Payer: Self-pay

## 2021-04-13 ENCOUNTER — Ambulatory Visit (INDEPENDENT_AMBULATORY_CARE_PROVIDER_SITE_OTHER): Payer: Medicare HMO | Admitting: Internal Medicine

## 2021-04-13 ENCOUNTER — Encounter (INDEPENDENT_AMBULATORY_CARE_PROVIDER_SITE_OTHER): Payer: Self-pay | Admitting: Internal Medicine

## 2021-04-13 DIAGNOSIS — D500 Iron deficiency anemia secondary to blood loss (chronic): Secondary | ICD-10-CM | POA: Diagnosis not present

## 2021-04-13 DIAGNOSIS — K219 Gastro-esophageal reflux disease without esophagitis: Secondary | ICD-10-CM | POA: Diagnosis not present

## 2021-04-13 DIAGNOSIS — D631 Anemia in chronic kidney disease: Secondary | ICD-10-CM | POA: Insufficient documentation

## 2021-04-13 LAB — TYPE AND SCREEN
ABO/RH(D): O POS
Antibody Screen: NEGATIVE
Unit division: 0
Unit division: 0

## 2021-04-13 LAB — BPAM RBC
Blood Product Expiration Date: 202211022359
Blood Product Expiration Date: 202211032359
ISSUE DATE / TIME: 202210010151
Unit Type and Rh: 5100
Unit Type and Rh: 9500

## 2021-04-13 NOTE — Progress Notes (Signed)
Presenting complaint;  Follow for iron deficiency anemia. History of GI bleed.  Database and subjective:  Patient is 80 year old Afro-American female who is here for scheduled visit accompanied by her husband.  She was last seen on 12/01/2020. Patient states her hemoglobin dropped again and she received 1 unit of PRBCs on 04/10/2021.  Her hemoglobin was 9.5 g on 03/15/2021.  It dropped to 6.9 on 04/09/2021 and after 1 unit and went up to 9.4 g. She also received blood transfusion on 02/27/2021 Patient says she has not passed any blood per rectum and she also denies melena.  She does recall that she had tiny clot with a bowel movement 1 month ago.  She complains of feeling tired and weak.  She has noted that this symptom is more pronounced when her hemoglobin drops. She says her appetite is very good.  She has occasional abdominal pain primarily in right upper quadrant but it resolved spontaneously.  She has intermittent diarrhea.  She believes she takes Imodium no more than 3-4 times in a month.  No history of constipation.  She feels heartburn is well controlled with therapy.  She takes omeprazole 20 mg daily.  She receives dialysis every Monday Wednesday and Friday. She is on low-dose aspirin but she does not take any other OTC NSAIDs.  Recent GI studies as follows  EGD on 02/05/2021 by Dr. Abbey Chatters for melena and anemia revealing mild Candida esophagitis otherwise normal examination.  EGD on 09/26/2019 by me revealing small sliding hiatal hernia and duodenal polyp.  Histology revealed duodenal adenoma.  Small bowel given capsule study on 03/06/2021 revealing 2 nonbleeding jejunal AV malformations and a single jejunal diverticulum without stigmata of bleeding.  Colonoscopy on 03/09/2019 revealing patent ileocolonic anastomosis, pancolonic diverticulosis.  3 polyps were removed.  2 polyps are small and one polyp was large multilobulated in transverse colon removed piecemeal.  There were no stigmata of  bleed from diverticula or this large polyp.  Current Medications: Outpatient Encounter Medications as of 04/13/2021  Medication Sig   ALPRAZolam (XANAX) 0.5 MG tablet Take 0.5 mg by mouth daily as needed.   aspirin EC 81 MG tablet Take 81 mg by mouth daily.    diclofenac Sodium (VOLTAREN) 1 % GEL Apply 2 g topically 4 (four) times daily as needed. (Patient taking differently: Apply 2 g topically 4 (four) times daily as needed (pain).)   fluticasone (FLONASE) 50 MCG/ACT nasal spray Place 1 spray into both nostrils at bedtime as needed for allergies.    hydrOXYzine (VISTARIL) 100 MG capsule Take 100 mg by mouth daily as needed for anxiety or itching.   isosorbide mononitrate (IMDUR) 60 MG 24 hr tablet Take 1 tablet (60 mg total) by mouth in the morning and at bedtime.   levothyroxine (SYNTHROID) 25 MCG tablet Take 1 tablet (25 mcg total) by mouth daily.   lidocaine-prilocaine (EMLA) cream Apply 1 application topically every Monday, Wednesday, and Friday. Prior to dialysis   loperamide (IMODIUM A-D) 2 MG tablet Take 2 tablets (4 mg total) by mouth daily as needed for diarrhea or loose stools.   midodrine (PROAMATINE) 5 MG tablet Take Monday, Wednesday, Friday morning BEFORE dialysis   multivitamin (RENA-VIT) TABS tablet Take 1 tablet by mouth daily.   nitroGLYCERIN (NITROSTAT) 0.4 MG SL tablet Place 1 tablet (0.4 mg total) under the tongue every 5 (five) minutes x 3 doses as needed for chest pain (if no relief after 2nd dose, proceed to the ED for an evaluation or call 911).  omeprazole (PRILOSEC) 20 MG capsule Take 1 capsule (20 mg total) by mouth daily.   sevelamer carbonate (RENVELA) 800 MG tablet Take 2 tablets (1,600 mg total) by mouth 3 (three) times daily with meals.   simvastatin (ZOCOR) 20 MG tablet Take 20 mg by mouth daily.   [DISCONTINUED] atorvastatin (LIPITOR) 10 MG tablet Take 10 mg by mouth daily.   metoprolol tartrate (LOPRESSOR) 25 MG tablet Take 0.5 tablets (12.5 mg total) by  mouth 2 (two) times daily.   No facility-administered encounter medications on file as of 04/13/2021.   Past Medical History:  Diagnosis Date   Acute on chronic respiratory failure with hypoxia ([place]) 10/10/2016   Anxiety    Arthritis    AVM (arteriovenous malformation) of colon    CAD (coronary artery disease)    a. s/p CABG in 2013 b. DES to D1 in 10/2016. c. cath in 07/2018 showing patent grafts with occlusion of D1 at prior stent site and progression of PDA disease --> medical management recommended   Carotid artery disease ([place])    a. 38-93% LICA, 01/3427    Chronic anemia    Chronic bronchitis (HCC)    Chronic diastolic CHF (congestive heart failure) ([place])    a. 02/2012 Echo EF 60-65%, nl wall motion, Gr 1 DD, mod MR   Colon cancer [facility]) right hemicolectomy 1992 1992   Esophageal stricture    ESRD on hemodialysis ([place])    ESRD due to HTN, started dialysis 2011 and gets HD at [facility] with Dr Hinda Lenis on MWF schedule.  Access is LUA AVF as of Sept 2014.    GERD (gastroesophageal reflux disease)    High cholesterol 12/2011   History of blood transfusion 07/2011; 12/2011; 01/2012 X 2; 04/2012   History of gout    History of lower GI bleeding    Hypertension    Iron deficiency anemia    Jugular vein occlusion, right (HCC)    Mitral regurgitation    a. Moderate by echo, 02/2012   Myocardial infarction [facility])    NSVT (nonsustained ventricular tachycardia)    Ovarian cancer ([place]) 1992   PAF (paroxysmal atrial fibrillation) ([place])    Pneumonia ~ 2009   PUD (peptic ulcer disease)    TIA (transient ischemic attack)    Past Surgical History:  Procedure Laterality Date   A/V SHUNTOGRAM Left 03/19/2019   Procedure: A/V SHUNTOGRAM;  Surgeon: Katha Cabal, MD;  Location: [place] CV LAB;  Service: Cardiovascular;  Laterality: Left;   ABDOMINAL HYSTERECTOMY  1992   APPENDECTOMY  06/1990   AV FISTULA PLACEMENT  07/2009   left upper arm   AV FISTULA PLACEMENT Right 09/06/2016    Procedure: RIGHT FOREARM ARTERIOVENOUS (AV) GRAFT;  Surgeon: Elam Dutch, MD;  Location: [place];  Service: Vascular;  Laterality: Right;   AV FISTULA PLACEMENT N/A 02/24/2017   Procedure: INSERTION OF ARTERIOVENOUS (AV) GORE-TEX GRAFT ARM (BRACHIAL AXILLARY);  Surgeon: Katha Cabal, MD;  Location: [facility] OR[facility];  Service: Vascular;  Laterality: N/A;   [place] Right 09/06/2016   Procedure: REMOVAL OF Right Arm ARTERIOVENOUS GORETEX GRAFT and Vein Patch angioplasty of brachial artery;  Surgeon: Angelia Mould, MD;  Location: [place];  Service: Vascular;  Laterality: Right;   BIOPSY  09/26/2019   Procedure: BIOPSY;  Surgeon: Rogene Houston, MD;  [facility];  Service: Endoscopy;;   COLON RESECTION  1992   COLON SURGERY     COLONOSCOPY N/A 03/09/2019   Procedure:  COLONOSCOPY;  Surgeon: Rogene Houston, MD;  [facility];  Service: Endoscopy;  Laterality: N/A;   CORONARY ANGIOPLASTY WITH STENT PLACEMENT  12/15/11   "2"   CORONARY ANGIOPLASTY WITH STENT PLACEMENT  y/2013   "1; makes total of 3" (05/02/2012)   CORONARY ARTERY BYPASS GRAFT  06/13/2012   Procedure: CORONARY ARTERY BYPASS GRAFTING (CABG);  Surgeon: Grace Isaac, MD;  Location: [place];  Service: Open Heart Surgery;  Laterality: N/A;  cabg x four;  using left internal mammary artery, and left leg greater saphenous vein harvested endoscopically   CORONARY STENT INTERVENTION N/A 10/13/2016   Procedure: Coronary Stent Intervention;  Surgeon: Troy Sine, MD;  Location: [place] CV LAB;  Service: Cardiovascular;  Laterality: N/A;   DIALYSIS/PERMA CATHETER REMOVAL N/A 04/18/2017   Procedure: DIALYSIS/PERMA CATHETER REMOVAL;  Surgeon: Katha Cabal, MD;  Location: [place] [facility];  Service: Cardiovascular;  Laterality: N/A;   DILATION AND CURETTAGE OF UTERUS     ESOPHAGOGASTRODUODENOSCOPY  01/20/2012   Procedure: ESOPHAGOGASTRODUODENOSCOPY (EGD);  Surgeon: Ladene Artist, MD,FACG;  Location: [facility]  ENDOSCOPY;  Service: Endoscopy;  Laterality: N/A;   ESOPHAGOGASTRODUODENOSCOPY N/A 03/26/2013   Procedure: ESOPHAGOGASTRODUODENOSCOPY (EGD);  Surgeon: Irene Shipper, MD;  Location: [facility] ENDOSCOPY;  Service: Endoscopy;  Laterality: N/A;   ESOPHAGOGASTRODUODENOSCOPY N/A 04/30/2015   Procedure: ESOPHAGOGASTRODUODENOSCOPY (EGD);  Surgeon: Rogene Houston, MD;  [facility];  Service: Endoscopy;  Laterality: N/A;  1pm - moved to 10/20 @ 1:10   ESOPHAGOGASTRODUODENOSCOPY N/A 07/29/2016   Procedure: ESOPHAGOGASTRODUODENOSCOPY (EGD);  Surgeon: Manus Gunning, MD;  Location: [place];  Service: Gastroenterology;  Laterality: N/A;  enteroscopy   ESOPHAGOGASTRODUODENOSCOPY N/A 09/26/2019   Procedure: ESOPHAGOGASTRODUODENOSCOPY (EGD);  Surgeon: Rogene Houston, MD;  [facility];  Service: Endoscopy;  Laterality: N/A;  1250   ESOPHAGOGASTRODUODENOSCOPY (EGD) WITH PROPOFOL N/A 02/05/2021   Procedure: ESOPHAGOGASTRODUODENOSCOPY (EGD) WITH PROPOFOL;  Surgeon: Eloise Harman, DO;  [facility];  Service: Endoscopy;  Laterality: N/A;   GIVENS CAPSULE STUDY N/A 03/07/2019   Procedure: GIVENS CAPSULE STUDY;  Surgeon: Rogene Houston, MD;  [facility];  Service: Endoscopy;  Laterality: N/A;  7:30   INSERTION OF DIALYSIS CATHETER N/A 10/05/2020   Procedure: ABORTED TUNNELED DIALYSIS CATHETER PLACEMENT RIGHT INTERNAL JUGULAR VEIN ;  Surgeon: Virl Cagey, MD;  [facility];  Service: General;  Laterality: N/A;   INTRAOPERATIVE TRANSESOPHAGEAL ECHOCARDIOGRAM  06/13/2012   Procedure: INTRAOPERATIVE TRANSESOPHAGEAL ECHOCARDIOGRAM;  Surgeon: Grace Isaac, MD;  Location: [place];  Service: Open Heart Surgery;  Laterality: N/A;   IR DIALY SHUNT INTRO NEEDLE/INTRACATH INITIAL W/IMG LEFT Left 10/06/2020   IR FLUORO GUIDE CV LINE RIGHT  06/17/2020   IR GENERIC HISTORICAL  07/26/2016   IR FLUORO GUIDE CV LINE RIGHT 07/26/2016 Greggory Keen, MD [facility]   IR  GENERIC HISTORICAL  07/26/2016   IR US GUIDE VASC ACCESS RIGHT 07/26/2016 Greggory Keen, MD [facility]   IR GENERIC HISTORICAL  08/02/2016   IR US GUIDE VASC ACCESS RIGHT 08/02/2016 Greggory Keen, MD [facility]   IR GENERIC HISTORICAL  08/02/2016   IR FLUORO GUIDE CV LINE RIGHT 08/02/2016 Greggory Keen, MD [facility]   IR RADIOLOGY PERIPHERAL GUIDED IV START  03/28/2017   IR REMOVAL TUN CV CATH W/O FL  08/11/2020   IR THROMBECTOMY AV FISTULA W/THROMBOLYSIS INC/SHUNT/IMG LEFT Left 06/17/2020   IR US GUIDE VASC ACCESS LEFT  06/17/2020   IR US  GUIDE VASC ACCESS RIGHT  03/28/2017   IR US GUIDE VASC ACCESS RIGHT  06/17/2020   LEFT HEART CATH AND CORONARY ANGIOGRAPHY N/A 09/20/2016   Procedure: Left Heart Cath and Coronary Angiography;  Surgeon: [name], MD;  Location: [place] [facility];  Service: Cardiovascular;  Laterality: N/A;   LEFT HEART CATH AND CORS/GRAFTS ANGIOGRAPHY N/A 10/13/2016   Procedure: Left Heart Cath and Cors/Grafts Angiography;  Surgeon: Troy Sine, MD;  Location: [place] [facility];  Service: Cardiovascular;  Laterality: N/A;   LEFT HEART CATH AND CORS/GRAFTS ANGIOGRAPHY N/A 07/13/2018   Procedure: LEFT HEART CATH AND CORS/GRAFTS ANGIOGRAPHY;  Surgeon: Martinique, Peter M, MD;  Location: [place] [facility];  Service: Cardiovascular;  Laterality: N/A;   LEFT HEART CATHETERIZATION WITH CORONARY ANGIOGRAM N/A 12/15/2011   Procedure: LEFT HEART CATHETERIZATION WITH CORONARY ANGIOGRAM;  Surgeon: Burnell Blanks, MD;  Location: [facility] CATH LAB;  Service: Cardiovascular;  Laterality: N/A;   LEFT HEART CATHETERIZATION WITH CORONARY ANGIOGRAM N/A 01/10/2012   Procedure: LEFT HEART CATHETERIZATION WITH CORONARY ANGIOGRAM;  Surgeon: Peter M Martinique, MD;  Location: [facility] CATH LAB;  Service: Cardiovascular;  Laterality: N/A;   LEFT HEART CATHETERIZATION WITH CORONARY ANGIOGRAM N/A 06/08/2012   Procedure: LEFT HEART CATHETERIZATION WITH CORONARY ANGIOGRAM;  Surgeon: Burnell Blanks, MD;   Location: [facility] CATH LAB;  Service: Cardiovascular;  Laterality: N/A;   LEFT HEART CATHETERIZATION WITH CORONARY/GRAFT ANGIOGRAM N/A 12/10/2013   Procedure: LEFT HEART CATHETERIZATION WITH Beatrix Fetters;  Surgeon: [name], MD;  Location: [facility] CATH LAB;  Service: Cardiovascular;  Laterality: N/A;   OVARY SURGERY     ovarian cancer   POLYPECTOMY  03/09/2019   Procedure: POLYPECTOMY;  Surgeon: Rogene Houston, MD;  [facility];  Service: Endoscopy;;  cecal    POLYPECTOMY N/A 09/26/2019   Procedure: DUODENAL POLYPECTOMY;  Surgeon: Rogene Houston, MD;  [facility];  Service: Endoscopy;  Laterality: N/A;   REVISION OF ARTERIOVENOUS GORETEX GRAFT N/A 02/24/2017   Procedure: REVISION OF ARTERIOVENOUS GORETEX GRAFT (RESECTION);  Surgeon: Katha Cabal, MD;  Location: [facility] ORS;  Service: Vascular;  Laterality: N/A;   REVISON OF ARTERIOVENOUS FISTULA Left 06/19/2020   Procedure: REVISION OF LEFT UPPER ARM AV GRAFT WITH INTERPOSITION JUMP GRAFT USING 6MM GORE LIMB;  Surgeon: Marty Heck, MD;  Location: [place];  Service: Vascular;  Laterality: Left;   SHUNTOGRAM N/A 10/15/2013   Procedure: Fistulogram;  Surgeon: Serafina Mitchell, MD;  Location: [facility] CATH LAB;  Service: Cardiovascular;  Laterality: N/A;   THROMBECTOMY / ARTERIOVENOUS GRAFT REVISION  2011   left upper arm   TUBAL LIGATION  1980's   UPPER EXTREMITY ANGIOGRAPHY Bilateral 12/06/2016   Procedure: Upper Extremity Angiography;  Surgeon: Katha Cabal, MD;  Location: [place] [facility];  Service: Cardiovascular;  Laterality: Bilateral;   UPPER EXTREMITY INTERVENTION Left 06/06/2017   Procedure: UPPER EXTREMITY INTERVENTION;  Surgeon: Katha Cabal, MD;  Location: [place] [facility];  Service: Cardiovascular;  Laterality: Left;      Objective: Weight 124.3 pounds height 62 inches Pulse 81/min, blood pressure 159/81, respirate 18 and temp 98.3. Patient is alert and in no acute  distress. Conjunctiva is pink. Sclera is nonicteric Oropharyngeal mucosa is normal. No neck masses or thyromegaly noted. Cardiac exam with regular rhythm normal S1 and S2. No murmur or gallop noted. Lungs are clear to auscultation. Abdomen is symmetrical.  Bowel sounds are normal.  No bruit noted.  On palpation  abdomen is soft and nontender with organomegaly or masses. No LE edema or clubbing noted.   Labs/studies Results:   CBC Latest Ref Rng & Units 04/10/2021 04/09/2021 03/15/2021  WBC 4.0 - 10.5 K/uL 10.9(H) 6.9 8.1  Hemoglobin 12.0 - 15.0 g/dL 9.4(L) 6.9(LL) 9.5(L)  Hematocrit 36.0 - 46.0 % 30.5(L) 22.4(L) 31.4(L)  Platelets 150 - 400 K/uL 276 305 293    CMP Latest Ref Rng & Units 04/09/2021 03/15/2021 03/12/2021  Glucose 70 - 99 mg/dL 141(H) 104(H) 111(H)  BUN 8 - 23 mg/dL 39(H) 37(H) 26(H)  Creatinine 0.44 - 1.00 mg/dL 7.34(H) 7.76(H) 5.97(H)  Sodium 135 - 145 mmol/L 138 136 141  Potassium 3.5 - 5.1 mmol/L 4.0 4.1 4.1  Chloride 98 - 111 mmol/L 100 98 100  CO2 22 - 32 mmol/L _0 Calcium 8.9 - 10.3 mg/dL 9.2 8.9 9.3  Total Protein 6.5 - 8.1 g/dL - - -  Total Bilirubin 0.3 - 1.2 mg/dL - - -  Alkaline Phos 38 - 126 U/L - - -  AST 15 - 41 U/L - - -  ALT 0 - 44 U/L - - -    Hepatic Function Latest Ref Rng & Units 02/27/2021 02/27/2021 02/07/2021  Total Protein 6.5 - 8.1 g/dL 6.0(L) 5.4(L) 6.1(L)  Albumin 3.5 - 5.0 g/dL 2.8(L) 2.5(L) 3.1(L)  AST 15 - 41 U/L 12(L) 9(L) 54(H)  ALT 0 - 44 U/L 12 10 57(H)  Alk Phosphatase 38 - 126 U/L 63 56 62  Total Bilirubin 0.3 - 1.2 mg/dL 0.3 0.5 0.5  Bilirubin, Direct 0.0 - 0.2 mg/dL - - -    Lab Results  Component Value Date   CRP 7.2 (H) 02/07/2021      Assessment:  #1.  Acute on chronic anemia secondary to GI bleed both overt and occult.  She required blood transfusion in August and another unit of PRBCs was given last week.  She has had rectal bleeding and melena in the past but this time she has not had any except she noted a small  clot 1 month ago.  In the past she has been found to have peptic ulcer disease and once or twice may have bled from colonic diverticulosis but I suspect chronic GI bleeding secondary to small bowel angiodysplasia.  She also has a history of ischemic injury to her bowel about 4 years ago and could have ischemic ulcers in her small bowel as well.  Aspirin even at low-dose may be enough to trigger GI bleed.  #2.  Remote history of colon carcinoma.  She underwent right hemicolectomy 30 years ago.  Last colonoscopy was about 2 years ago with removal of 3 adenomas.  She may need colonoscopy this year instead of next year depending on findings and given capsule study.  If given capsule study is negative I would recommend proceeding with colonoscopy now instead of next year  Plan:  She will continue to have H&H once a week at the time of dialysis. Proceed with small bowel given capsule study near future. Patient advised to call office or report to emergency room if she has melena or frank rectal bleeding. Office visit in 3 months.

## 2021-04-13 NOTE — Patient Instructions (Signed)
Small bowel given capsule study to be scheduled

## 2021-04-13 NOTE — H&P (View-Only) (Signed)
Presenting complaint;  Follow for iron deficiency anemia. History of GI bleed.  Database and subjective:  Patient is 80 year old Afro-American female who is here for scheduled visit accompanied by her husband.  She was last seen on 12/01/2020. Patient states her hemoglobin dropped again and she received 1 unit of PRBCs on 04/10/2021.  Her hemoglobin was 9.5 g on 03/15/2021.  It dropped to 6.9 on 04/09/2021 and after 1 unit and went up to 9.4 g. She also received blood transfusion on 02/27/2021 Patient says she has not passed any blood per rectum and she also denies melena.  She does recall that she had tiny clot with a bowel movement 1 month ago.  She complains of feeling tired and weak.  She has noted that this symptom is more pronounced when her hemoglobin drops. She says her appetite is very good.  She has occasional abdominal pain primarily in right upper quadrant but it resolved spontaneously.  She has intermittent diarrhea.  She believes she takes Imodium no more than 3-4 times in a month.  No history of constipation.  She feels heartburn is well controlled with therapy.  She takes omeprazole 20 mg daily.  She receives dialysis every Monday Wednesday and Friday. She is on low-dose aspirin but she does not take any other OTC NSAIDs.  Recent GI studies as follows  EGD on 02/05/2021 by Dr. Abbey Chatters for melena and anemia revealing mild Candida esophagitis otherwise normal examination.  EGD on 09/26/2019 by me revealing small sliding hiatal hernia and duodenal polyp.  Histology revealed duodenal adenoma.  Small bowel given capsule study on 03/06/2021 revealing 2 nonbleeding jejunal AV malformations and a single jejunal diverticulum without stigmata of bleeding.  Colonoscopy on 03/09/2019 revealing patent ileocolonic anastomosis, pancolonic diverticulosis.  3 polyps were removed.  2 polyps are small and one polyp was large multilobulated in transverse colon removed piecemeal.  There were no stigmata of  bleed from diverticula or this large polyp.  Current Medications: Outpatient Encounter Medications as of 04/13/2021  Medication Sig   ALPRAZolam (XANAX) 0.5 MG tablet Take 0.5 mg by mouth daily as needed.   aspirin EC 81 MG tablet Take 81 mg by mouth daily.    diclofenac Sodium (VOLTAREN) 1 % GEL Apply 2 g topically 4 (four) times daily as needed. (Patient taking differently: Apply 2 g topically 4 (four) times daily as needed (pain).)   fluticasone (FLONASE) 50 MCG/ACT nasal spray Place 1 spray into both nostrils at bedtime as needed for allergies.    hydrOXYzine (VISTARIL) 100 MG capsule Take 100 mg by mouth daily as needed for anxiety or itching.   isosorbide mononitrate (IMDUR) 60 MG 24 hr tablet Take 1 tablet (60 mg total) by mouth in the morning and at bedtime.   levothyroxine (SYNTHROID) 25 MCG tablet Take 1 tablet (25 mcg total) by mouth daily.   lidocaine-prilocaine (EMLA) cream Apply 1 application topically every Monday, Wednesday, and Friday. Prior to dialysis   loperamide (IMODIUM A-D) 2 MG tablet Take 2 tablets (4 mg total) by mouth daily as needed for diarrhea or loose stools.   midodrine (PROAMATINE) 5 MG tablet Take Monday, Wednesday, Friday morning BEFORE dialysis   multivitamin (RENA-VIT) TABS tablet Take 1 tablet by mouth daily.   nitroGLYCERIN (NITROSTAT) 0.4 MG SL tablet Place 1 tablet (0.4 mg total) under the tongue every 5 (five) minutes x 3 doses as needed for chest pain (if no relief after 2nd dose, proceed to the ED for an evaluation or call 911).  omeprazole (PRILOSEC) 20 MG capsule Take 1 capsule (20 mg total) by mouth daily.   sevelamer carbonate (RENVELA) 800 MG tablet Take 2 tablets (1,600 mg total) by mouth 3 (three) times daily with meals.   simvastatin (ZOCOR) 20 MG tablet Take 20 mg by mouth daily.   [DISCONTINUED] atorvastatin (LIPITOR) 10 MG tablet Take 10 mg by mouth daily.   metoprolol tartrate (LOPRESSOR) 25 MG tablet Take 0.5 tablets (12.5 mg total) by  mouth 2 (two) times daily.   No facility-administered encounter medications on file as of 04/13/2021.   Past Medical History:  Diagnosis Date   Acute on chronic respiratory failure with hypoxia ([place]) 10/10/2016   Anxiety    Arthritis    AVM (arteriovenous malformation) of colon    CAD (coronary artery disease)    a. s/p CABG in 2013 b. DES to D1 in 10/2016. c. cath in 07/2018 showing patent grafts with occlusion of D1 at prior stent site and progression of PDA disease --> medical management recommended   Carotid artery disease ([place])    a. 38-93% LICA, 01/3427    Chronic anemia    Chronic bronchitis (HCC)    Chronic diastolic CHF (congestive heart failure) ([place])    a. 02/2012 Echo EF 60-65%, nl wall motion, Gr 1 DD, mod MR   Colon cancer [facility]) right hemicolectomy 1992 1992   Esophageal stricture    ESRD on hemodialysis ([place])    ESRD due to HTN, started dialysis 2011 and gets HD at [facility] with Dr Hinda Lenis on MWF schedule.  Access is LUA AVF as of Sept 2014.    GERD (gastroesophageal reflux disease)    High cholesterol 12/2011   History of blood transfusion 07/2011; 12/2011; 01/2012 X 2; 04/2012   History of gout    History of lower GI bleeding    Hypertension    Iron deficiency anemia    Jugular vein occlusion, right (HCC)    Mitral regurgitation    a. Moderate by echo, 02/2012   Myocardial infarction [facility])    NSVT (nonsustained ventricular tachycardia)    Ovarian cancer ([place]) 1992   PAF (paroxysmal atrial fibrillation) ([place])    Pneumonia ~ 2009   PUD (peptic ulcer disease)    TIA (transient ischemic attack)    Past Surgical History:  Procedure Laterality Date   A/V SHUNTOGRAM Left 03/19/2019   Procedure: A/V SHUNTOGRAM;  Surgeon: Katha Cabal, MD;  Location: [place] CV LAB;  Service: Cardiovascular;  Laterality: Left;   ABDOMINAL HYSTERECTOMY  1992   APPENDECTOMY  06/1990   AV FISTULA PLACEMENT  07/2009   left upper arm   AV FISTULA PLACEMENT Right 09/06/2016    Procedure: RIGHT FOREARM ARTERIOVENOUS (AV) GRAFT;  Surgeon: Elam Dutch, MD;  Location: [place];  Service: Vascular;  Laterality: Right;   AV FISTULA PLACEMENT N/A 02/24/2017   Procedure: INSERTION OF ARTERIOVENOUS (AV) GORE-TEX GRAFT ARM (BRACHIAL AXILLARY);  Surgeon: Katha Cabal, MD;  Location: [facility] OR[facility];  Service: Vascular;  Laterality: N/A;   [place] Right 09/06/2016   Procedure: REMOVAL OF Right Arm ARTERIOVENOUS GORETEX GRAFT and Vein Patch angioplasty of brachial artery;  Surgeon: Angelia Mould, MD;  Location: [place];  Service: Vascular;  Laterality: Right;   BIOPSY  09/26/2019   Procedure: BIOPSY;  Surgeon: Rogene Houston, MD;  [facility];  Service: Endoscopy;;   COLON RESECTION  1992   COLON SURGERY     COLONOSCOPY N/A 03/09/2019   Procedure:  COLONOSCOPY;  Surgeon: Rogene Houston, MD;  [facility];  Service: Endoscopy;  Laterality: N/A;   CORONARY ANGIOPLASTY WITH STENT PLACEMENT  12/15/11   "2"   CORONARY ANGIOPLASTY WITH STENT PLACEMENT  y/2013   "1; makes total of 3" (05/02/2012)   CORONARY ARTERY BYPASS GRAFT  06/13/2012   Procedure: CORONARY ARTERY BYPASS GRAFTING (CABG);  Surgeon: Grace Isaac, MD;  Location: [place];  Service: Open Heart Surgery;  Laterality: N/A;  cabg x four;  using left internal mammary artery, and left leg greater saphenous vein harvested endoscopically   CORONARY STENT INTERVENTION N/A 10/13/2016   Procedure: Coronary Stent Intervention;  Surgeon: Troy Sine, MD;  Location: [place] [facility];  Service: Cardiovascular;  Laterality: N/A;   DIALYSIS/PERMA CATHETER REMOVAL N/A 04/18/2017   Procedure: DIALYSIS/PERMA CATHETER REMOVAL;  Surgeon: Katha Cabal, MD;  Location: [place] [facility];  Service: Cardiovascular;  Laterality: N/A;   DILATION AND CURETTAGE OF UTERUS     ESOPHAGOGASTRODUODENOSCOPY  01/20/2012   Procedure: ESOPHAGOGASTRODUODENOSCOPY (EGD);  Surgeon: Ladene Artist, MD,FACG;  Location: [facility]  ENDOSCOPY;  Service: Endoscopy;  Laterality: N/A;   ESOPHAGOGASTRODUODENOSCOPY N/A 03/26/2013   Procedure: ESOPHAGOGASTRODUODENOSCOPY (EGD);  Surgeon: Irene Shipper, MD;  Location: [facility] ENDOSCOPY;  Service: Endoscopy;  Laterality: N/A;   ESOPHAGOGASTRODUODENOSCOPY N/A 04/30/2015   Procedure: ESOPHAGOGASTRODUODENOSCOPY (EGD);  Surgeon: Rogene Houston, MD;  [facility];  Service: Endoscopy;  Laterality: N/A;  1pm - moved to 10/20 @ 1:10   ESOPHAGOGASTRODUODENOSCOPY N/A 07/29/2016   Procedure: ESOPHAGOGASTRODUODENOSCOPY (EGD);  Surgeon: Manus Gunning, MD;  Location: [place];  Service: Gastroenterology;  Laterality: N/A;  enteroscopy   ESOPHAGOGASTRODUODENOSCOPY N/A 09/26/2019   Procedure: ESOPHAGOGASTRODUODENOSCOPY (EGD);  Surgeon: Rogene Houston, MD;  [facility];  Service: Endoscopy;  Laterality: N/A;  1250   ESOPHAGOGASTRODUODENOSCOPY (EGD) WITH PROPOFOL N/A 02/05/2021   Procedure: ESOPHAGOGASTRODUODENOSCOPY (EGD) WITH PROPOFOL;  Surgeon: Eloise Harman, DO;  [facility];  Service: Endoscopy;  Laterality: N/A;   GIVENS CAPSULE STUDY N/A 03/07/2019   Procedure: GIVENS CAPSULE STUDY;  Surgeon: Rogene Houston, MD;  [facility];  Service: Endoscopy;  Laterality: N/A;  7:30   INSERTION OF DIALYSIS CATHETER N/A 10/05/2020   Procedure: ABORTED TUNNELED DIALYSIS CATHETER PLACEMENT RIGHT INTERNAL JUGULAR VEIN ;  Surgeon: Virl Cagey, MD;  [facility];  Service: General;  Laterality: N/A;   INTRAOPERATIVE TRANSESOPHAGEAL ECHOCARDIOGRAM  06/13/2012   Procedure: INTRAOPERATIVE TRANSESOPHAGEAL ECHOCARDIOGRAM;  Surgeon: Grace Isaac, MD;  Location: [place];  Service: Open Heart Surgery;  Laterality: N/A;   IR DIALY SHUNT INTRO NEEDLE/INTRACATH INITIAL W/IMG LEFT Left 10/06/2020   IR FLUORO GUIDE CV LINE RIGHT  06/17/2020   IR GENERIC HISTORICAL  07/26/2016   IR FLUORO GUIDE CV LINE RIGHT 07/26/2016 Greggory Keen, MD [facility]   IR  GENERIC HISTORICAL  07/26/2016   IR US GUIDE VASC ACCESS RIGHT 07/26/2016 Greggory Keen, MD [facility]   IR GENERIC HISTORICAL  08/02/2016   IR US GUIDE VASC ACCESS RIGHT 08/02/2016 Greggory Keen, MD [facility]   IR GENERIC HISTORICAL  08/02/2016   IR FLUORO GUIDE CV LINE RIGHT 08/02/2016 Greggory Keen, MD [facility]   IR RADIOLOGY PERIPHERAL GUIDED IV START  03/28/2017   IR REMOVAL TUN CV CATH W/O FL  08/11/2020   IR THROMBECTOMY AV FISTULA W/THROMBOLYSIS INC/SHUNT/IMG LEFT Left 06/17/2020   IR US GUIDE VASC ACCESS LEFT  06/17/2020   IR US  GUIDE VASC ACCESS RIGHT  03/28/2017   IR US GUIDE VASC ACCESS RIGHT  06/17/2020   LEFT HEART CATH AND CORONARY ANGIOGRAPHY N/A 09/20/2016   Procedure: Left Heart Cath and Coronary Angiography;  Surgeon: [name], MD;  Location: [place] [facility];  Service: Cardiovascular;  Laterality: N/A;   LEFT HEART CATH AND CORS/GRAFTS ANGIOGRAPHY N/A 10/13/2016   Procedure: Left Heart Cath and Cors/Grafts Angiography;  Surgeon: Troy Sine, MD;  Location: [place] [facility];  Service: Cardiovascular;  Laterality: N/A;   LEFT HEART CATH AND CORS/GRAFTS ANGIOGRAPHY N/A 07/13/2018   Procedure: LEFT HEART CATH AND CORS/GRAFTS ANGIOGRAPHY;  Surgeon: Martinique, Peter M, MD;  Location: [place] [facility];  Service: Cardiovascular;  Laterality: N/A;   LEFT HEART CATHETERIZATION WITH CORONARY ANGIOGRAM N/A 12/15/2011   Procedure: LEFT HEART CATHETERIZATION WITH CORONARY ANGIOGRAM;  Surgeon: Burnell Blanks, MD;  Location: [facility] CATH LAB;  Service: Cardiovascular;  Laterality: N/A;   LEFT HEART CATHETERIZATION WITH CORONARY ANGIOGRAM N/A 01/10/2012   Procedure: LEFT HEART CATHETERIZATION WITH CORONARY ANGIOGRAM;  Surgeon: Peter M Martinique, MD;  Location: [facility] CATH LAB;  Service: Cardiovascular;  Laterality: N/A;   LEFT HEART CATHETERIZATION WITH CORONARY ANGIOGRAM N/A 06/08/2012   Procedure: LEFT HEART CATHETERIZATION WITH CORONARY ANGIOGRAM;  Surgeon: Burnell Blanks, MD;   Location: [facility] CATH LAB;  Service: Cardiovascular;  Laterality: N/A;   LEFT HEART CATHETERIZATION WITH CORONARY/GRAFT ANGIOGRAM N/A 12/10/2013   Procedure: LEFT HEART CATHETERIZATION WITH Beatrix Fetters;  Surgeon: [name], MD;  Location: [facility] CATH LAB;  Service: Cardiovascular;  Laterality: N/A;   OVARY SURGERY     ovarian cancer   POLYPECTOMY  03/09/2019   Procedure: POLYPECTOMY;  Surgeon: Rogene Houston, MD;  [facility];  Service: Endoscopy;;  cecal    POLYPECTOMY N/A 09/26/2019   Procedure: DUODENAL POLYPECTOMY;  Surgeon: Rogene Houston, MD;  [facility];  Service: Endoscopy;  Laterality: N/A;   REVISION OF ARTERIOVENOUS GORETEX GRAFT N/A 02/24/2017   Procedure: REVISION OF ARTERIOVENOUS GORETEX GRAFT (RESECTION);  Surgeon: Katha Cabal, MD;  Location: [facility] OR[facility];  Service: Vascular;  Laterality: N/A;   REVISON OF ARTERIOVENOUS FISTULA Left 06/19/2020   Procedure: REVISION OF LEFT UPPER ARM AV GRAFT WITH INTERPOSITION JUMP GRAFT USING 6MM GORE LIMB;  Surgeon: Marty Heck, MD;  Location: [place];  Service: Vascular;  Laterality: Left;   SHUNTOGRAM N/A 10/15/2013   Procedure: Fistulogram;  Surgeon: Serafina Mitchell, MD;  Location: [facility] CATH LAB;  Service: Cardiovascular;  Laterality: N/A;   THROMBECTOMY / ARTERIOVENOUS GRAFT REVISION  2011   left upper arm   TUBAL LIGATION  1980's   UPPER EXTREMITY ANGIOGRAPHY Bilateral 12/06/2016   Procedure: Upper Extremity Angiography;  Surgeon: Katha Cabal, MD;  Location: [place] [facility];  Service: Cardiovascular;  Laterality: Bilateral;   UPPER EXTREMITY INTERVENTION Left 06/06/2017   Procedure: UPPER EXTREMITY INTERVENTION;  Surgeon: Katha Cabal, MD;  Location: [place] [facility];  Service: Cardiovascular;  Laterality: Left;      Objective: Weight 124.3 pounds height 62 inches Pulse 81/min, blood pressure 159/81, respirate 18 and temp 98.3. Patient is alert and in no acute  distress. Conjunctiva is pink. Sclera is nonicteric Oropharyngeal mucosa is normal. No neck masses or thyromegaly noted. Cardiac exam with regular rhythm normal S1 and S2. No murmur or gallop noted. Lungs are clear to auscultation. Abdomen is symmetrical.  Bowel sounds are normal.  No bruit noted.  On palpation  abdomen is soft and nontender with organomegaly or masses. No LE edema or clubbing noted.   Labs/studies Results:   CBC Latest Ref Rng & Units 04/10/2021 04/09/2021 03/15/2021  WBC 4.0 - 10.5 K/uL 10.9(H) 6.9 8.1  Hemoglobin 12.0 - 15.0 g/dL 9.4(L) 6.9(LL) 9.5(L)  Hematocrit 36.0 - 46.0 % 30.5(L) 22.4(L) 31.4(L)  Platelets 150 - 400 K/uL 276 305 293    CMP Latest Ref Rng & Units 04/09/2021 03/15/2021 03/12/2021  Glucose 70 - 99 mg/dL 141(H) 104(H) 111(H)  BUN 8 - 23 mg/dL 39(H) 37(H) 26(H)  Creatinine 0.44 - 1.00 mg/dL 7.34(H) 7.76(H) 5.97(H)  Sodium 135 - 145 mmol/L 138 136 141  Potassium 3.5 - 5.1 mmol/L 4.0 4.1 4.1  Chloride 98 - 111 mmol/L 100 98 100  CO2 22 - 32 mmol/L _0 Calcium 8.9 - 10.3 mg/dL 9.2 8.9 9.3  Total Protein 6.5 - 8.1 g/dL - - -  Total Bilirubin 0.3 - 1.2 mg/dL - - -  Alkaline Phos 38 - 126 U/L - - -  AST 15 - 41 U/L - - -  ALT 0 - 44 U/L - - -    Hepatic Function Latest Ref Rng & Units 02/27/2021 02/27/2021 02/07/2021  Total Protein 6.5 - 8.1 g/dL 6.0(L) 5.4(L) 6.1(L)  Albumin 3.5 - 5.0 g/dL 2.8(L) 2.5(L) 3.1(L)  AST 15 - 41 U/L 12(L) 9(L) 54(H)  ALT 0 - 44 U/L 12 10 57(H)  Alk Phosphatase 38 - 126 U/L 63 56 62  Total Bilirubin 0.3 - 1.2 mg/dL 0.3 0.5 0.5  Bilirubin, Direct 0.0 - 0.2 mg/dL - - -    Lab Results  Component Value Date   CRP 7.2 (H) 02/07/2021      Assessment:  #1.  Acute on chronic anemia secondary to GI bleed both overt and occult.  She required blood transfusion in August and another unit of PRBCs was given last week.  She has had rectal bleeding and melena in the past but this time she has not had any except she noted a small  clot 1 month ago.  In the past she has been found to have peptic ulcer disease and once or twice may have bled from colonic diverticulosis but I suspect chronic GI bleeding secondary to small bowel angiodysplasia.  She also has a history of ischemic injury to her bowel about 4 years ago and could have ischemic ulcers in her small bowel as well.  Aspirin even at low-dose may be enough to trigger GI bleed.  #2.  Remote history of colon carcinoma.  She underwent right hemicolectomy 30 years ago.  Last colonoscopy was about 2 years ago with removal of 3 adenomas.  She may need colonoscopy this year instead of next year depending on findings and given capsule study.  If given capsule study is negative I would recommend proceeding with colonoscopy now instead of next year  Plan:  She will continue to have H&H once a week at the time of dialysis. Proceed with small bowel given capsule study near future. Patient advised to call office or report to emergency room if she has melena or frank rectal bleeding. Office visit in 3 months.

## 2021-04-19 NOTE — Progress Notes (Signed)
History of Present Illness:   This 80 year old female presents for follow-up of her right renal lesion.  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  She was first seen by me in 2019 for a right renal lesion in the upper pole.  This was followed sequentially with radiographic imaging/CT scan.  Last CT scan in September 2021 revealed:  1. Very atrophic appearance of the kidneys in keeping with dialysis dependent end-stage renal disease with small, generally low-attenuation renal lesions present bilaterally and not significantly changed compared to prior examination. There is a redemonstrated lobulated lesion of the superior pole of the right kidney measuring approximately 2.8 x 2.0 cm, of intermediate attenuation, HU = 28, and at most minimally increased in size in comparison to multiple prior examinations dating back to 2014. Findings are most consistent with a hemorrhagic or proteinaceous cyst. No further routine follow-up is required. 2. Status post terminal ileocolectomy and reanastomosis. 3. Pancolonic diverticulosis without evidence of acute diverticulitis. 4. Coronary artery disease.  Aortic Atherosclerosis (ICD10-I70.0).    Past Medical History:  Diagnosis Date   Acute on chronic respiratory failure with hypoxia ([place]) 10/10/2016   Anxiety    Arthritis    AVM (arteriovenous malformation) of colon    CAD (coronary artery disease)    a. s/p CABG in 2013 b. DES to D1 in 10/2016. c. cath in 07/2018 showing patent grafts with occlusion of D1 at prior stent site and progression of PDA disease --> medical management recommended   Carotid artery disease ([place])    a. 74-12% LICA, 02/7866    Chronic anemia    Chronic bronchitis (HCC)    Chronic diastolic CHF (congestive heart failure) ([place])    a. 02/2012 Echo EF 60-65%, nl wall motion, Gr 1 DD, mod MR   Colon cancer ([place]) 1992   Esophageal stricture    ESRD on hemodialysis ([place])    ESRD due to  HTN, started dialysis 2011 and gets HD at [facility] with Dr Hinda Lenis on MWF schedule.  Access is LUA AVF as of Sept 2014.    GERD (gastroesophageal reflux disease)    High cholesterol 12/2011   History of blood transfusion 07/2011; 12/2011; 01/2012 X 2; 04/2012   History of gout    History of lower GI bleeding    Hypertension    Iron deficiency anemia    Jugular vein occlusion, right (HCC)    Mitral regurgitation    a. Moderate by echo, 02/2012   Myocardial infarction [facility])    NSVT (nonsustained ventricular tachycardia)    Ovarian cancer ([place]) 1992   PAF (paroxysmal atrial fibrillation) ([place])    Pneumonia ~ 2009   PUD (peptic ulcer disease)    TIA (transient ischemic attack)     Past Surgical History:  Procedure Laterality Date   A/V SHUNTOGRAM Left 03/19/2019   Procedure: A/V SHUNTOGRAM;  Surgeon: Katha Cabal, MD;  Location: [place] [facility];  Service: Cardiovascular;  Laterality: Left;   ABDOMINAL HYSTERECTOMY  1992   APPENDECTOMY  06/1990   AV FISTULA PLACEMENT  07/2009   left upper arm   AV FISTULA PLACEMENT Right 09/06/2016   Procedure: RIGHT FOREARM ARTERIOVENOUS (AV) GRAFT;  Surgeon: Elam Dutch, MD;  Location: [place];  Service: Vascular;  Laterality: Right;   AV FISTULA PLACEMENT N/A 02/24/2017   Procedure: INSERTION OF ARTERIOVENOUS (AV) GORE-TEX GRAFT ARM (BRACHIAL AXILLARY);  Surgeon: Katha Cabal, MD;  Location: [facility];  Service: Vascular;  Laterality: N/A;  [place] REMOVAL Right 09/06/2016   Procedure: REMOVAL OF Right Arm ARTERIOVENOUS GORETEX GRAFT and Vein Patch angioplasty of brachial artery;  Surgeon: Angelia Mould, MD;  Location: [place];  Service: Vascular;  Laterality: Right;   BIOPSY  09/26/2019   Procedure: BIOPSY;  Surgeon: Rogene Houston, MD;  [facility];  Service: Endoscopy;;   COLON RESECTION  1992   COLON SURGERY     COLONOSCOPY N/A 03/09/2019   Procedure: COLONOSCOPY;  Surgeon: Rogene Houston, MD;  [facility];  Service: Endoscopy;  Laterality: N/A;   CORONARY ANGIOPLASTY WITH STENT PLACEMENT  12/15/11   "2"   CORONARY ANGIOPLASTY WITH STENT PLACEMENT  y/2013   "1; makes total of 3" (05/02/2012)   CORONARY ARTERY BYPASS GRAFT  06/13/2012   Procedure: CORONARY ARTERY BYPASS GRAFTING (CABG);  Surgeon: Grace Isaac, MD;  Location: [place];  Service: Open Heart Surgery;  Laterality: N/A;  cabg x four;  using left internal mammary artery, and left leg greater saphenous vein harvested endoscopically   CORONARY STENT INTERVENTION N/A 10/13/2016   Procedure: Coronary Stent Intervention;  Surgeon: Troy Sine, MD;  Location: [place] [facility];  Service: Cardiovascular;  Laterality: N/A;   DIALYSIS/PERMA CATHETER REMOVAL N/A 04/18/2017   Procedure: DIALYSIS/PERMA CATHETER REMOVAL;  Surgeon: Katha Cabal, MD;  Location: [place] [facility];  Service: Cardiovascular;  Laterality: N/A;   DILATION AND CURETTAGE OF UTERUS     ESOPHAGOGASTRODUODENOSCOPY  01/20/2012   Procedure: ESOPHAGOGASTRODUODENOSCOPY (EGD);  Surgeon: Ladene Artist, MD,FACG;  Location: [facility] ENDOSCOPY;  Service: Endoscopy;  Laterality: N/A;   ESOPHAGOGASTRODUODENOSCOPY N/A 03/26/2013   Procedure: ESOPHAGOGASTRODUODENOSCOPY (EGD);  Surgeon: Irene Shipper, MD;  Location: [facility] ENDOSCOPY;  Service: Endoscopy;  Laterality: N/A;   ESOPHAGOGASTRODUODENOSCOPY N/A 04/30/2015   Procedure: ESOPHAGOGASTRODUODENOSCOPY (EGD);  Surgeon: Rogene Houston, MD;  [facility];  Service: Endoscopy;  Laterality: N/A;  1pm - moved to 10/20 @ 1:10   ESOPHAGOGASTRODUODENOSCOPY N/A 07/29/2016   Procedure: ESOPHAGOGASTRODUODENOSCOPY (EGD);  Surgeon: Manus Gunning, MD;  Location: [place];  Service: Gastroenterology;  Laterality: N/A;  enteroscopy   ESOPHAGOGASTRODUODENOSCOPY N/A 09/26/2019   Procedure: ESOPHAGOGASTRODUODENOSCOPY (EGD);  Surgeon: Rogene Houston, MD;  [facility];  Service: Endoscopy;  Laterality: N/A;  1250    ESOPHAGOGASTRODUODENOSCOPY (EGD) WITH PROPOFOL N/A 02/05/2021   Procedure: ESOPHAGOGASTRODUODENOSCOPY (EGD) WITH PROPOFOL;  Surgeon: Eloise Harman, DO;  [facility];  Service: Endoscopy;  Laterality: N/A;   GIVENS CAPSULE STUDY N/A 03/07/2019   Procedure: GIVENS CAPSULE STUDY;  Surgeon: Rogene Houston, MD;  [facility];  Service: Endoscopy;  Laterality: N/A;  7:30   INSERTION OF DIALYSIS CATHETER N/A 10/05/2020   Procedure: ABORTED TUNNELED DIALYSIS CATHETER PLACEMENT RIGHT INTERNAL JUGULAR VEIN ;  Surgeon: Virl Cagey, MD;  [facility];  Service: General;  Laterality: N/A;   INTRAOPERATIVE TRANSESOPHAGEAL ECHOCARDIOGRAM  06/13/2012   Procedure: INTRAOPERATIVE TRANSESOPHAGEAL ECHOCARDIOGRAM;  Surgeon: Grace Isaac, MD;  Location: [place];  Service: Open Heart Surgery;  Laterality: N/A;   IR DIALY SHUNT INTRO NEEDLE/INTRACATH INITIAL W/IMG LEFT Left 10/06/2020   IR FLUORO GUIDE CV LINE RIGHT  06/17/2020   IR GENERIC HISTORICAL  07/26/2016   IR FLUORO GUIDE CV LINE RIGHT 07/26/2016 Greggory Keen, MD [facility]   IR GENERIC HISTORICAL  07/26/2016   IR US GUIDE VASC ACCESS RIGHT 07/26/2016 Greggory Keen, MD [facility]   IR GENERIC HISTORICAL  08/02/2016  IR US GUIDE VASC ACCESS RIGHT 08/02/2016 Greggory Keen, MD [facility]   IR GENERIC HISTORICAL  08/02/2016   IR FLUORO GUIDE CV LINE RIGHT 08/02/2016 Greggory Keen, MD [facility]   IR RADIOLOGY PERIPHERAL GUIDED IV START  03/28/2017   IR REMOVAL TUN CV CATH W/O FL  08/11/2020   IR THROMBECTOMY AV FISTULA W/THROMBOLYSIS INC/SHUNT/IMG LEFT Left 06/17/2020   IR US GUIDE VASC ACCESS LEFT  06/17/2020   IR US GUIDE VASC ACCESS RIGHT  03/28/2017   IR US GUIDE VASC ACCESS RIGHT  06/17/2020   LEFT HEART CATH AND CORONARY ANGIOGRAPHY N/A 09/20/2016   Procedure: Left Heart Cath and Coronary Angiography;  Surgeon: [name], MD;  Location: [place] [facility];  Service: Cardiovascular;  Laterality: N/A;   LEFT HEART  CATH AND CORS/GRAFTS ANGIOGRAPHY N/A 10/13/2016   Procedure: Left Heart Cath and Cors/Grafts Angiography;  Surgeon: Troy Sine, MD;  Location: [place] [facility];  Service: Cardiovascular;  Laterality: N/A;   LEFT HEART CATH AND CORS/GRAFTS ANGIOGRAPHY N/A 07/13/2018   Procedure: LEFT HEART CATH AND CORS/GRAFTS ANGIOGRAPHY;  Surgeon: Martinique, Peter M, MD;  Location: [place] [facility];  Service: Cardiovascular;  Laterality: N/A;   LEFT HEART CATHETERIZATION WITH CORONARY ANGIOGRAM N/A 12/15/2011   Procedure: LEFT HEART CATHETERIZATION WITH CORONARY ANGIOGRAM;  Surgeon: Burnell Blanks, MD;  Location: [facility] CATH LAB;  Service: Cardiovascular;  Laterality: N/A;   LEFT HEART CATHETERIZATION WITH CORONARY ANGIOGRAM N/A 01/10/2012   Procedure: LEFT HEART CATHETERIZATION WITH CORONARY ANGIOGRAM;  Surgeon: Peter M Martinique, MD;  Location: [facility] CATH LAB;  Service: Cardiovascular;  Laterality: N/A;   LEFT HEART CATHETERIZATION WITH CORONARY ANGIOGRAM N/A 06/08/2012   Procedure: LEFT HEART CATHETERIZATION WITH CORONARY ANGIOGRAM;  Surgeon: Burnell Blanks, MD;  Location: [facility] CATH LAB;  Service: Cardiovascular;  Laterality: N/A;   LEFT HEART CATHETERIZATION WITH CORONARY/GRAFT ANGIOGRAM N/A 12/10/2013   Procedure: LEFT HEART CATHETERIZATION WITH Beatrix Fetters;  Surgeon: [name], MD;  Location: [facility] CATH LAB;  Service: Cardiovascular;  Laterality: N/A;   OVARY SURGERY     ovarian cancer   POLYPECTOMY  03/09/2019   Procedure: POLYPECTOMY;  Surgeon: Rogene Houston, MD;  [facility];  Service: Endoscopy;;  cecal    POLYPECTOMY N/A 09/26/2019   Procedure: DUODENAL POLYPECTOMY;  Surgeon: Rogene Houston, MD;  [facility];  Service: Endoscopy;  Laterality: N/A;   REVISION OF ARTERIOVENOUS GORETEX GRAFT N/A 02/24/2017   Procedure: REVISION OF ARTERIOVENOUS GORETEX GRAFT (RESECTION);  Surgeon: Katha Cabal, MD;  Location: [facility] OR[facility];  Service: Vascular;  Laterality:  N/A;   REVISON OF ARTERIOVENOUS FISTULA Left 06/19/2020   Procedure: REVISION OF LEFT UPPER ARM AV GRAFT WITH INTERPOSITION JUMP GRAFT USING 6MM GORE LIMB;  Surgeon: Marty Heck, MD;  Location: [place];  Service: Vascular;  Laterality: Left;   SHUNTOGRAM N/A 10/15/2013   Procedure: Fistulogram;  Surgeon: Serafina Mitchell, MD;  Location: [facility] CATH LAB;  Service: Cardiovascular;  Laterality: N/A;   THROMBECTOMY / ARTERIOVENOUS GRAFT REVISION  2011   left upper arm   TUBAL LIGATION  1980's   UPPER EXTREMITY ANGIOGRAPHY Bilateral 12/06/2016   Procedure: Upper Extremity Angiography;  Surgeon: Katha Cabal, MD;  Location: [place] [facility];  Service: Cardiovascular;  Laterality: Bilateral;   UPPER EXTREMITY INTERVENTION Left 06/06/2017   Procedure: UPPER EXTREMITY INTERVENTION;  Surgeon: Katha Cabal, MD;  Location: [place] [facility];  Service: Cardiovascular;  Laterality: Left;  Home Medications:  Allergies as of 04/20/2021       Reactions   Aspirin Other (See Comments)   High Doses Mess up her stomach; "makes my bowels have blood in them". Takes 81 mg EC Aspirin    Penicillins Other (See Comments)   SYNCOPE? , "makes me real weak when I take it; like I'll pass out" Has patient had a PCN reaction causing immediate rash, facial/tongue/throat swelling, SOB or lightheadedness with hypotension: Yes Has patient had a PCN reaction causing severe rash involving mucus membranes or skin necrosis: no Has patient had a PCN reaction that required hospitalization no Has patient had a PCN reaction occurring within the last 10 years: no If all of the above   Amlodipine Swelling   Bactrim [sulfamethoxazole-trimethoprim] Rash   Contrast Media [iodinated Diagnostic Agents] Itching   Iron Itching, Other (See Comments)   "they gave me iron in dialysis; had to give me Benadryl cause I had to have the iron" (05/02/2012)   Nitrofurantoin Hives   Tylenol [acetaminophen] Itching, Other (See  Comments)   Makes her feet on fire per pt   Gabapentin Other (See Comments)   Unknown reaction   Hydralazine Itching   Has tolerated while inpatient   Levofloxacin    No Known Allergies    Ranexa [ranolazine] Other (See Comments)   Myoclonus-hospitalized    Dexilant [dexlansoprazole] Other (See Comments)   Upset stomach   Levaquin [levofloxacin In D5w] Rash   Morphine And Related Itching, Other (See Comments)   Itching in feet   Plavix [clopidogrel Bisulfate] Rash   Protonix [pantoprazole Sodium] Rash   Venofer [ferric Oxide] Itching, Other (See Comments)   Patient reports using Benadryl prior to doses as [facility]        Medication List        Accurate as of April 19, 2021  7:45 PM. If you have any questions, ask your nurse or doctor.          ALPRAZolam 0.5 MG tablet Commonly known as: XANAX Take 0.5 mg by mouth daily as needed.   aspirin EC 81 MG tablet Take 81 mg by mouth daily.   diclofenac Sodium 1 % Gel Commonly known as: Voltaren Apply 2 g topically 4 (four) times daily as needed. What changed: reasons to take this   fluticasone 50 MCG/ACT nasal spray Commonly known as: FLONASE Place 1 spray into both nostrils at bedtime as needed for allergies.   hydrOXYzine 100 MG capsule Commonly known as: VISTARIL Take 100 mg by mouth daily as needed for anxiety or itching.   isosorbide mononitrate 60 MG 24 hr tablet Commonly known as: IMDUR Take 1 tablet (60 mg total) by mouth in the morning and at bedtime.   levothyroxine 25 MCG tablet Commonly known as: SYNTHROID Take 1 tablet (25 mcg total) by mouth daily.   lidocaine-prilocaine cream Commonly known as: EMLA Apply 1 application topically every Monday, Wednesday, and Friday. Prior to dialysis   loperamide 2 MG tablet Commonly known as: IMODIUM A-D Take 2 tablets (4 mg total) by mouth daily as needed for diarrhea or loose stools.   metoprolol tartrate 25 MG tablet Commonly known as:  LOPRESSOR Take 0.5 tablets (12.5 mg total) by mouth 2 (two) times daily.   midodrine 5 MG tablet Commonly known as: PROAMATINE Take Monday, Wednesday, Friday morning BEFORE dialysis   multivitamin Tabs tablet Take 1 tablet by mouth daily.   nitroGLYCERIN 0.4 MG SL tablet Commonly known as: NITROSTAT Place  1 tablet (0.4 mg total) under the tongue every 5 (five) minutes x 3 doses as needed for chest pain (if no relief after 2nd dose, proceed to the ED for an evaluation or call 911).   omeprazole 20 MG capsule Commonly known as: PRILOSEC Take 1 capsule (20 mg total) by mouth daily.   sevelamer carbonate 800 MG tablet Commonly known as: RENVELA Take 2 tablets (1,600 mg total) by mouth 3 (three) times daily with meals.   simvastatin 20 MG tablet Commonly known as: ZOCOR Take 20 mg by mouth daily.        Allergies:  Allergies  Allergen Reactions   Aspirin Other (See Comments)    High Doses Mess up her stomach; "makes my bowels have blood in them". Takes 81 mg EC Aspirin    Penicillins Other (See Comments)    SYNCOPE? , "makes me real weak when I take it; like I'll pass out"  Has patient had a PCN reaction causing immediate rash, facial/tongue/throat swelling, SOB or lightheadedness with hypotension: Yes Has patient had a PCN reaction causing severe rash involving mucus membranes or skin necrosis: no Has patient had a PCN reaction that required hospitalization no Has patient had a PCN reaction occurring within the last 10 years: no If all of the above   Amlodipine Swelling   Bactrim [Sulfamethoxazole-Trimethoprim] Rash   Contrast Media [Iodinated Diagnostic Agents] Itching   Iron Itching and Other (See Comments)    "they gave me iron in dialysis; had to give me Benadryl cause I had to have the iron" (05/02/2012)   Nitrofurantoin Hives   Tylenol [Acetaminophen] Itching and Other (See Comments)    Makes her feet on fire per pt   Gabapentin Other (See Comments)    Unknown  reaction   Hydralazine Itching    Has tolerated while inpatient   Levofloxacin    No Known Allergies    Ranexa [Ranolazine] Other (See Comments)    Myoclonus-hospitalized    Dexilant [Dexlansoprazole] Other (See Comments)    Upset stomach   Levaquin [Levofloxacin In D5w] Rash   Morphine And Related Itching and Other (See Comments)    Itching in feet   Plavix [Clopidogrel Bisulfate] Rash   Protonix [Pantoprazole Sodium] Rash   Venofer [Ferric Oxide] Itching and Other (See Comments)    Patient reports using Benadryl prior to doses as [place]    Family History  Problem Relation Age of Onset   Heart disease Mother        Heart Disease before age 92   Hyperlipidemia Mother    Hypertension Mother    Diabetes Mother    Heart attack Mother    Heart disease Father        Heart Disease before age 85   Hyperlipidemia Father    Hypertension Father    Diabetes Father    Diabetes Sister    Hypertension Sister    Diabetes Brother    Hyperlipidemia Brother    Heart attack Brother    Hypertension Sister    Heart attack Brother    Colon cancer Child 44   Other Other        noncontributory for early CAD   Esophageal cancer Neg Hx    Liver disease Neg Hx    Kidney disease Neg Hx    Colon polyps Neg Hx     Social History:  reports that she has never smoked. She has never used smokeless tobacco. She reports that she does not drink  alcohol and does not use drugs.  ROS: A complete review of systems was performed.  All systems are negative except for pertinent findings as noted.  Physical Exam:  Vital signs in last 24 hours: There were no vitals taken for this visit. Constitutional:  Alert and oriented, No acute distress.  Patient is in wheelchair.  Her husband accompanies her. Cardiovascular: Regular rate  Respiratory: Normal respiratory effor Lymphatic: No lymphadenopathy Neurologic: Grossly intact, no focal deficits Psychiatric: Normal mood and affect  I have reviewed  prior pt notes I have independently reviewed prior imaging-CT images reviewed with the patient and her husband  Impression/Assessment:  Stable cystic structure in right upper pole, no need for further follow-up  Plan:  I will have her come back on an as-needed basis

## 2021-04-20 ENCOUNTER — Encounter: Payer: Self-pay | Admitting: Urology

## 2021-04-20 ENCOUNTER — Other Ambulatory Visit: Payer: Self-pay

## 2021-04-20 ENCOUNTER — Ambulatory Visit (INDEPENDENT_AMBULATORY_CARE_PROVIDER_SITE_OTHER): Payer: Medicare HMO | Admitting: Urology

## 2021-04-20 VITALS — BP 154/73 | HR 76 | Temp 98.8°F

## 2021-04-20 DIAGNOSIS — N281 Cyst of kidney, acquired: Secondary | ICD-10-CM

## 2021-04-20 DIAGNOSIS — D3001 Benign neoplasm of right kidney: Secondary | ICD-10-CM

## 2021-04-20 NOTE — Progress Notes (Signed)
Urological Symptom Review  Patient is experiencing the following symptoms: N/a   Review of Systems  Gastrointestinal (upper)  : Negative for upper GI symptoms  Gastrointestinal (lower) : Diarrhea  Constitutional : Weight loss  Skin: Itching  Eyes: Negative for eye symptoms  Ear/Nose/Throat : Sinus problems  Hematologic/Lymphatic: Negative for Hematologic/Lymphatic symptoms  Cardiovascular : Leg swelling  Respiratory : Cough  Endocrine: Negative for endocrine symptoms  Musculoskeletal: Negative for musculoskeletal symptoms  Neurological: Negative for neurological symptoms  Psychologic: Negative for psychiatric symptoms

## 2021-04-22 ENCOUNTER — Ambulatory Visit (HOSPITAL_COMMUNITY)
Admission: RE | Admit: 2021-04-22 | Discharge: 2021-04-22 | Disposition: A | Discharge status: Home / Self Care | Payer: Medicare HMO | Attending: Internal Medicine | Admitting: Internal Medicine

## 2021-04-22 ENCOUNTER — Encounter (HOSPITAL_COMMUNITY)
Admission: RE | Disposition: A | Discharge status: Home / Self Care | Payer: Self-pay | Source: Home / Self Care | Attending: Internal Medicine

## 2021-04-22 DIAGNOSIS — Z85038 Personal history of other malignant neoplasm of large intestine: Secondary | ICD-10-CM | POA: Insufficient documentation

## 2021-04-22 DIAGNOSIS — D1330 Benign neoplasm of unspecified part of small intestine: Secondary | ICD-10-CM | POA: Diagnosis not present

## 2021-04-22 DIAGNOSIS — Z9049 Acquired absence of other specified parts of digestive tract: Secondary | ICD-10-CM | POA: Diagnosis not present

## 2021-04-22 DIAGNOSIS — D62 Acute posthemorrhagic anemia: Secondary | ICD-10-CM | POA: Insufficient documentation

## 2021-04-22 DIAGNOSIS — K317 Polyp of stomach and duodenum: Secondary | ICD-10-CM | POA: Diagnosis not present

## 2021-04-22 HISTORY — PX: GIVENS CAPSULE STUDY: SHX5432

## 2021-04-22 SURGERY — IMAGING PROCEDURE, GI TRACT, INTRALUMINAL, VIA CAPSULE

## 2021-04-22 NOTE — Interval H&P Note (Signed)
Patient is here for small bowel given capsule study.  History and Physical Interval Note:  04/22/2021 5:08 PM  Shawna [name]  has presented today for surgery, with the diagnosis of Iron Deficiency Anemia.  The various methods of treatment have been discussed with the patient and family. After consideration of risks, benefits and other options for treatment, the patient has consented to  Procedure(s) with comments: GIVENS CAPSULE STUDY (N/A) - 7:30 as a surgical intervention.  The patient's history has been reviewed, patient examined, no change in status, stable for surgery.  I have reviewed the patient's chart and labs.  Questions were answered to the patient's satisfaction.     [employer]

## 2021-04-23 ENCOUNTER — Emergency Department (HOSPITAL_COMMUNITY): Payer: Medicare HMO

## 2021-04-23 ENCOUNTER — Other Ambulatory Visit: Payer: Self-pay

## 2021-04-23 ENCOUNTER — Encounter (HOSPITAL_COMMUNITY): Payer: Self-pay | Admitting: *Deleted

## 2021-04-23 ENCOUNTER — Observation Stay (HOSPITAL_COMMUNITY)
Admission: EM | Admit: 2021-04-23 | Discharge: 2021-04-24 | Disposition: A | Discharge status: Home / Self Care | Payer: Medicare HMO | Attending: Internal Medicine | Admitting: Internal Medicine

## 2021-04-23 DIAGNOSIS — I5033 Acute on chronic diastolic (congestive) heart failure: Secondary | ICD-10-CM | POA: Diagnosis not present

## 2021-04-23 DIAGNOSIS — R778 Other specified abnormalities of plasma proteins: Secondary | ICD-10-CM | POA: Insufficient documentation

## 2021-04-23 DIAGNOSIS — I120 Hypertensive chronic kidney disease with stage 5 chronic kidney disease or end stage renal disease: Secondary | ICD-10-CM | POA: Diagnosis not present

## 2021-04-23 DIAGNOSIS — D649 Anemia, unspecified: Secondary | ICD-10-CM | POA: Diagnosis present

## 2021-04-23 DIAGNOSIS — Z992 Dependence on renal dialysis: Secondary | ICD-10-CM | POA: Diagnosis not present

## 2021-04-23 DIAGNOSIS — I2510 Atherosclerotic heart disease of native coronary artery without angina pectoris: Secondary | ICD-10-CM | POA: Diagnosis not present

## 2021-04-23 DIAGNOSIS — I248 Other forms of acute ischemic heart disease: Secondary | ICD-10-CM | POA: Diagnosis not present

## 2021-04-23 DIAGNOSIS — Z85038 Personal history of other malignant neoplasm of large intestine: Secondary | ICD-10-CM | POA: Insufficient documentation

## 2021-04-23 DIAGNOSIS — I132 Hypertensive heart and chronic kidney disease with heart failure and with stage 5 chronic kidney disease, or end stage renal disease: Secondary | ICD-10-CM | POA: Diagnosis not present

## 2021-04-23 DIAGNOSIS — Z20822 Contact with and (suspected) exposure to covid-19: Secondary | ICD-10-CM | POA: Insufficient documentation

## 2021-04-23 DIAGNOSIS — I480 Paroxysmal atrial fibrillation: Principal | ICD-10-CM | POA: Insufficient documentation

## 2021-04-23 DIAGNOSIS — Z8543 Personal history of malignant neoplasm of ovary: Secondary | ICD-10-CM | POA: Diagnosis not present

## 2021-04-23 DIAGNOSIS — I5022 Chronic systolic (congestive) heart failure: Secondary | ICD-10-CM | POA: Diagnosis present

## 2021-04-23 DIAGNOSIS — Z8673 Personal history of transient ischemic attack (TIA), and cerebral infarction without residual deficits: Secondary | ICD-10-CM | POA: Insufficient documentation

## 2021-04-23 DIAGNOSIS — I5032 Chronic diastolic (congestive) heart failure: Secondary | ICD-10-CM | POA: Diagnosis not present

## 2021-04-23 DIAGNOSIS — I2489 Other forms of acute ischemic heart disease: Secondary | ICD-10-CM | POA: Diagnosis present

## 2021-04-23 DIAGNOSIS — Z951 Presence of aortocoronary bypass graft: Secondary | ICD-10-CM | POA: Diagnosis not present

## 2021-04-23 DIAGNOSIS — I4892 Unspecified atrial flutter: Secondary | ICD-10-CM

## 2021-04-23 DIAGNOSIS — Z79899 Other long term (current) drug therapy: Secondary | ICD-10-CM | POA: Insufficient documentation

## 2021-04-23 DIAGNOSIS — E119 Type 2 diabetes mellitus without complications: Secondary | ICD-10-CM

## 2021-04-23 DIAGNOSIS — R7989 Other specified abnormal findings of blood chemistry: Secondary | ICD-10-CM

## 2021-04-23 DIAGNOSIS — N186 End stage renal disease: Secondary | ICD-10-CM | POA: Insufficient documentation

## 2021-04-23 DIAGNOSIS — Z7982 Long term (current) use of aspirin: Secondary | ICD-10-CM | POA: Insufficient documentation

## 2021-04-23 DIAGNOSIS — E1122 Type 2 diabetes mellitus with diabetic chronic kidney disease: Secondary | ICD-10-CM | POA: Diagnosis not present

## 2021-04-23 DIAGNOSIS — G629 Polyneuropathy, unspecified: Secondary | ICD-10-CM | POA: Insufficient documentation

## 2021-04-23 DIAGNOSIS — R000 Tachycardia, unspecified: Secondary | ICD-10-CM | POA: Diagnosis present

## 2021-04-23 DIAGNOSIS — D631 Anemia in chronic kidney disease: Secondary | ICD-10-CM | POA: Diagnosis present

## 2021-04-23 DIAGNOSIS — I5042 Chronic combined systolic (congestive) and diastolic (congestive) heart failure: Secondary | ICD-10-CM | POA: Diagnosis present

## 2021-04-23 DIAGNOSIS — Z955 Presence of coronary angioplasty implant and graft: Secondary | ICD-10-CM | POA: Insufficient documentation

## 2021-04-23 DIAGNOSIS — R079 Chest pain, unspecified: Secondary | ICD-10-CM | POA: Diagnosis not present

## 2021-04-23 DIAGNOSIS — E539 Vitamin B deficiency, unspecified: Secondary | ICD-10-CM | POA: Insufficient documentation

## 2021-04-23 LAB — BASIC METABOLIC PANEL
Anion gap: 9 (ref 5–15)
BUN: 16 mg/dL (ref 8–23)
CO2: 30 mmol/L (ref 22–32)
Calcium: 8.6 mg/dL — ABNORMAL LOW (ref 8.9–10.3)
Chloride: 97 mmol/L — ABNORMAL LOW (ref 98–111)
Creatinine, Ser: 4.1 mg/dL — ABNORMAL HIGH (ref 0.44–1.00)
GFR, Estimated: 10 mL/min — ABNORMAL LOW (ref >60)
Glucose, Bld: 115 mg/dL — ABNORMAL HIGH (ref 70–99)
Potassium: 3.8 mmol/L (ref 3.5–5.1)
Sodium: 136 mmol/L (ref 135–145)

## 2021-04-23 LAB — TROPONIN I (HIGH SENSITIVITY)
Troponin I (High Sensitivity): 483 ng/L (ref <18)
Troponin I (High Sensitivity): 458 ng/L (ref <18)

## 2021-04-23 LAB — CBC
HCT: 28.5 % — ABNORMAL LOW (ref 36.0–46.0)
Hemoglobin: 8.8 g/dL — ABNORMAL LOW (ref 12.0–15.0)
MCH: 35.2 pg — ABNORMAL HIGH (ref 26.0–34.0)
MCHC: 30.9 g/dL (ref 30.0–36.0)
MCV: 114 fL — ABNORMAL HIGH (ref 80.0–100.0)
Platelets: 259 10*3/uL (ref 150–400)
RBC: 2.5 MIL/uL — ABNORMAL LOW (ref 3.87–5.11)
RDW: 24.5 % — ABNORMAL HIGH (ref 11.5–15.5)
WBC: 9.8 10*3/uL (ref 4.0–10.5)
nRBC: 0 % (ref 0.0–0.2)

## 2021-04-23 LAB — RESP PANEL BY RT-PCR (FLU A&B, COVID) ARPGX2
Influenza A by PCR: NEGATIVE
Influenza B by PCR: NEGATIVE
SARS Coronavirus 2 by RT PCR: NEGATIVE

## 2021-04-23 IMAGING — DX DG CHEST 1V PORT
1 series · 1 of 1 positions shown · non-contrast
Comparison: [DATE]

CLINICAL DATA: Chest pain during dialysis

EXAM:
PORTABLE CHEST 1 VIEW

[chest ap]
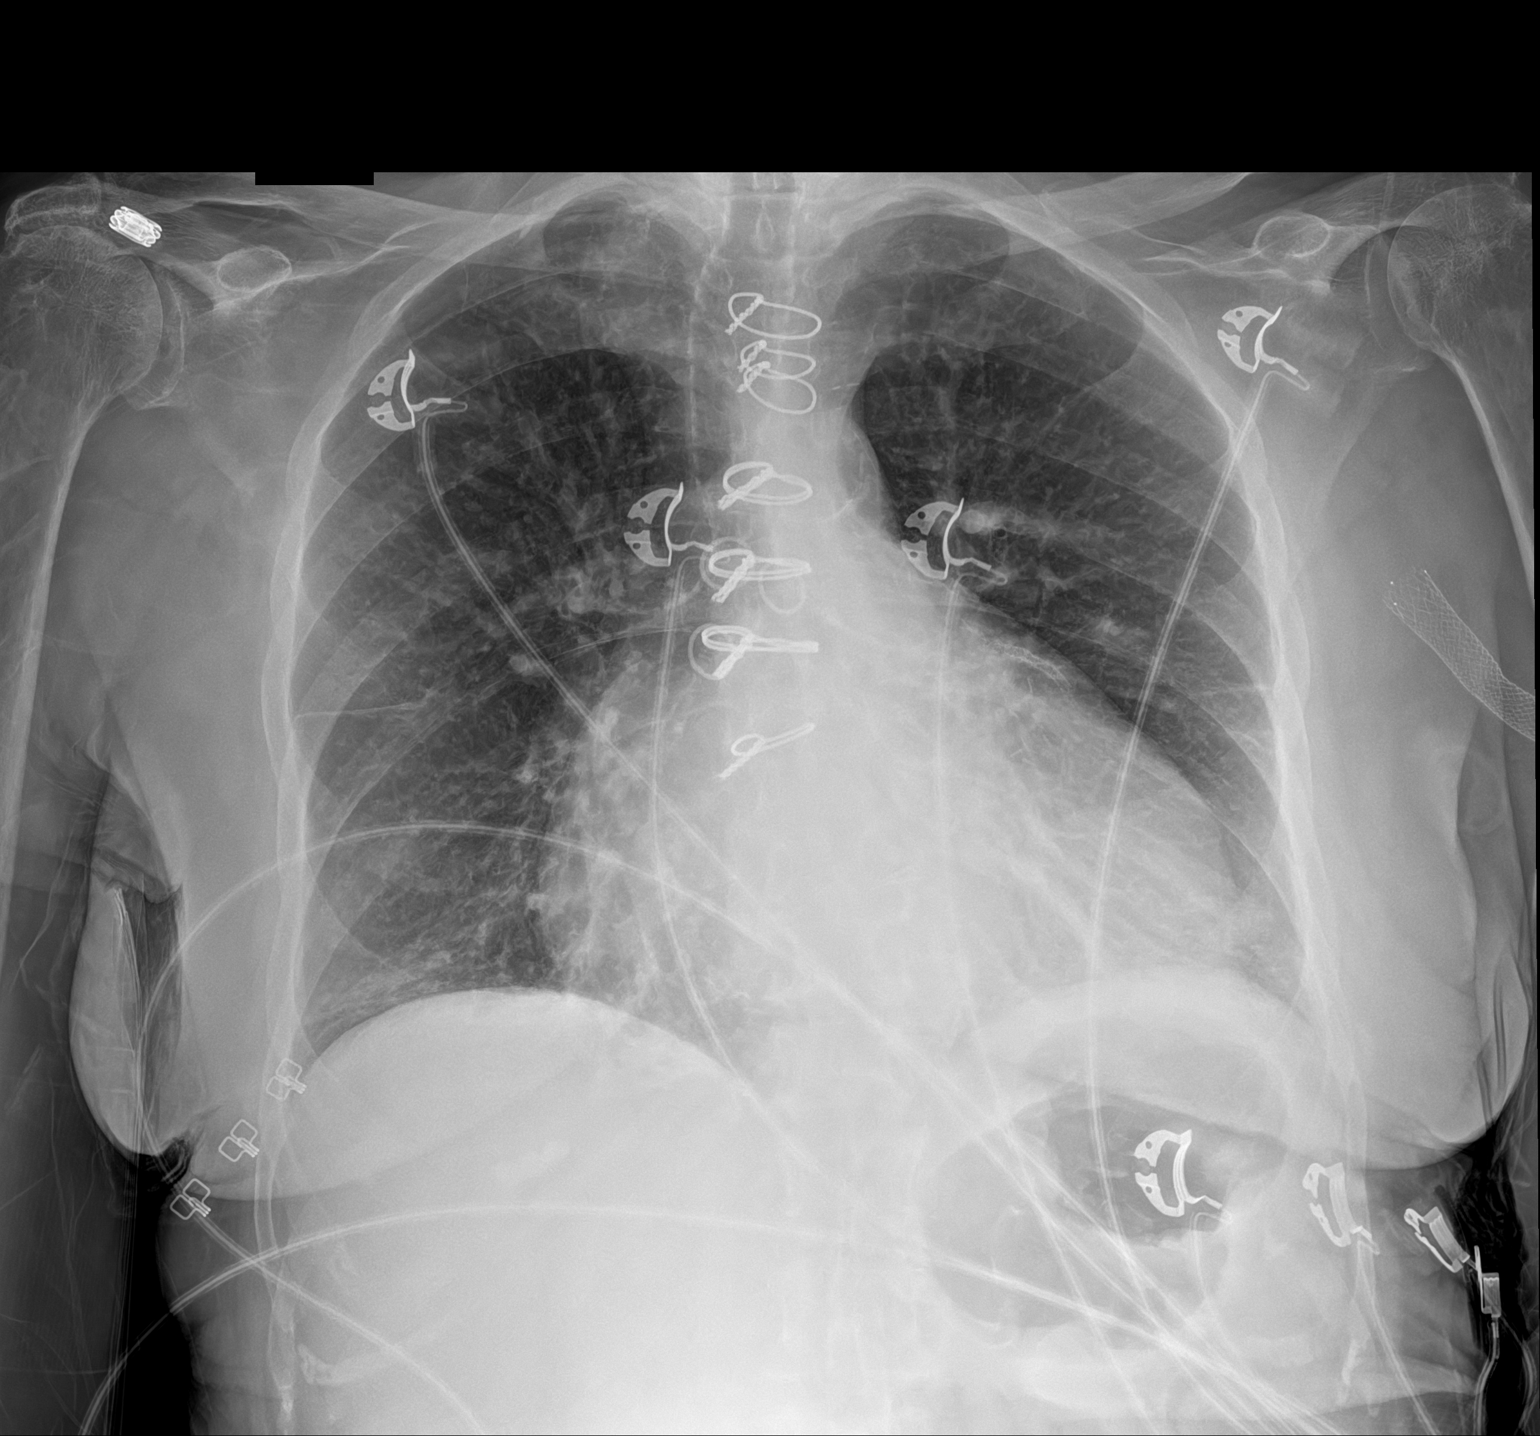

[1 of 1 positions shown; findings below may reference images not displayed]

FINDINGS: Cardiac enlargement prior CABG. Negative for heart failure. Negative
for edema or effusion. Mild bibasilar atelectasis.
IMPRESSION: Mild bibasilar atelectasis.  Negative for edema.

## 2021-04-23 MED ORDER — METOPROLOL TARTRATE 5 MG/5ML IV SOLN
5.0000 mg | Freq: Once | INTRAVENOUS | Status: AC
  Administered 2021-04-23: 5 mg via INTRAVENOUS
  Filled 2021-04-23: qty 5

## 2021-04-23 MED ORDER — NITROGLYCERIN 0.4 MG SL SUBL: 0.4000 mg | SUBLINGUAL_TABLET | SUBLINGUAL | Status: DC | PRN

## 2021-04-23 MED ORDER — LEVOTHYROXINE SODIUM 25 MCG PO TABS
25.0000 ug | ORAL_TABLET | Freq: Every day | ORAL | Status: DC
  Administered 2021-04-24: 25 ug via ORAL
  Filled 2021-04-23: qty 1

## 2021-04-23 MED ORDER — OMEPRAZOLE 20 MG PO CPDR
20.0000 mg | DELAYED_RELEASE_CAPSULE | Freq: Every day | ORAL | Status: DC
  Filled 2021-04-23: qty 1

## 2021-04-23 MED ORDER — ONDANSETRON HCL 4 MG/2ML IJ SOLN: 4.0000 mg | Freq: Four times a day (QID) | INTRAMUSCULAR | Status: DC | PRN

## 2021-04-23 MED ORDER — METOPROLOL TARTRATE 12.5 MG HALF TABLET
12.5000 mg | ORAL_TABLET | Freq: Two times a day (BID) | ORAL | Status: DC
  Administered 2021-04-23: 12.5 mg via ORAL
  Filled 2021-04-23: qty 1

## 2021-04-23 MED ORDER — METOPROLOL TARTRATE 50 MG PO TABS
50.0000 mg | ORAL_TABLET | Freq: Two times a day (BID) | ORAL | Status: DC
  Administered 2021-04-23: 50 mg via ORAL
  Filled 2021-04-23: qty 1

## 2021-04-23 MED ORDER — PANTOPRAZOLE SODIUM 40 MG PO TBEC: 40.0000 mg | DELAYED_RELEASE_TABLET | Freq: Every day | ORAL | Status: DC

## 2021-04-23 MED ORDER — HEPARIN SODIUM (PORCINE) 5000 UNIT/ML IJ SOLN
5000.0000 [IU] | Freq: Three times a day (TID) | INTRAMUSCULAR | Status: DC
  Filled 2021-04-23: qty 1

## 2021-04-23 MED ORDER — ASPIRIN EC 81 MG PO TBEC: 81.0000 mg | DELAYED_RELEASE_TABLET | Freq: Every day | ORAL | Status: DC

## 2021-04-23 MED ORDER — ALPRAZOLAM 0.5 MG PO TABS
0.5000 mg | ORAL_TABLET | Freq: Every day | ORAL | Status: DC | PRN
  Administered 2021-04-23: 0.5 mg via ORAL
  Filled 2021-04-23: qty 1

## 2021-04-23 MED ORDER — ISOSORBIDE MONONITRATE ER 60 MG PO TB24: 60.0000 mg | ORAL_TABLET | Freq: Every day | ORAL | Status: DC

## 2021-04-23 MED ORDER — SIMVASTATIN 20 MG PO TABS: 20.0000 mg | ORAL_TABLET | Freq: Every day | ORAL | Status: DC

## 2021-04-23 MED ORDER — SEVELAMER CARBONATE 800 MG PO TABS
1600.0000 mg | ORAL_TABLET | Freq: Three times a day (TID) | ORAL | Status: DC
  Administered 2021-04-24: 1600 mg via ORAL
  Filled 2021-04-23: qty 2

## 2021-04-23 MED ORDER — ASPIRIN 325 MG PO TABS
325.0000 mg | ORAL_TABLET | Freq: Once | ORAL | Status: AC
  Administered 2021-04-23: 325 mg via ORAL
  Filled 2021-04-23: qty 1

## 2021-04-23 NOTE — ED Notes (Signed)
Troponin 483 collected at 1445. Almyra Free, MD notified.

## 2021-04-23 NOTE — ED Provider Notes (Signed)
[facility] EMERGENCY DEPARTMENT Provider Note   CSN: [id number] Arrival date & time: 04/23/21  1439     History Chief Complaint  Patient presents with   Chest Pain    Shawna [name] is a 80 y.o. female.  Patient presents to ER chief complaint of rapid heart rate.  She had presented to her dialysis center today for hemodialysis.  During the middle of her session he noticed tachycardia.  They were able to finish her dialysis and she continued to be tachycardic she was sent to the ER.  She states that she had chest pain last night.  However denies any chest pain today.  She otherwise states she has no pain or discomfort and feels "fine."  Denies headache chest pain abdominal pain.  No fever no cough no vomiting or diarrhea reported.      Past Medical History:  Diagnosis Date   Acute on chronic respiratory failure with hypoxia ([place]) 10/10/2016   Anxiety    Arthritis    AVM (arteriovenous malformation) of colon    CAD (coronary artery disease)    a. s/p CABG in 2013 b. DES to D1 in 10/2016. c. cath in 07/2018 showing patent grafts with occlusion of D1 at prior stent site and progression of PDA disease --> medical management recommended   Carotid artery disease ([place])    a. 09-32% LICA, 09/5571    Chronic anemia    Chronic bronchitis (HCC)    Chronic diastolic CHF (congestive heart failure) ([place])    a. 02/2012 Echo EF 60-65%, nl wall motion, Gr 1 DD, mod MR   Colon cancer ([place]) 1992   Esophageal stricture    ESRD on hemodialysis ([place])    ESRD due to HTN, started dialysis 2011 and gets HD at [facility] with Dr Hinda Lenis on MWF schedule.  Access is LUA AVF as of Sept 2014.    GERD (gastroesophageal reflux disease)    High cholesterol 12/2011   History of blood transfusion 07/2011; 12/2011; 01/2012 X 2; 04/2012   History of gout    History of lower GI bleeding    Hypertension    Iron deficiency anemia    Jugular vein occlusion, right (HCC)    Mitral regurgitation    a. Moderate by  echo, 02/2012   Myocardial infarction [facility])    NSVT (nonsustained ventricular tachycardia)    Ovarian cancer ([name]) 1992   PAF (paroxysmal atrial fibrillation) ([place])    Pneumonia ~ 2009   PUD (peptic ulcer disease)    TIA (transient ischemic attack)     Patient Active Problem List   Diagnosis Date Noted   Anemia due to chronic blood loss 04/13/2021   Left knee pain 03/15/2021   Moderate protein-calorie malnutrition ([place]) 02/27/2021   Hypokalemia 02/27/2021   COVID-19 virus infection 02/03/2021   Leukocytosis 02/03/2021   Elevated MCV 02/03/2021   Hypoalbuminemia due to protein-calorie malnutrition ([place]) 02/03/2021   Hypothyroidism (acquired) 02/03/2021   Myositis 12/03/2020   Ataxia 12/02/2020   Diabetes mellitus type 2 in nonobese ([place]) 11/10/2020   Acute pulmonary edema ([name]) 11/10/2020   Generalized weakness 11/09/2020   Dialysis AV fistula malfunction, initial encounter ([place])    Jugular vein occlusion, right (HCC)    Failure of surgically constructed arteriovenous fistula ([place]) 10/03/2020   Myoclonus 08/31/2020   Clotted renal dialysis AV graft, initial encounter ([place])    Hypertensive heart and chronic kidney disease with heart failure and stage 1 through stage 4 chronic kidney disease, or chronic  kidney disease ([place])    Hemodialysis-associated hypotension    Acute hypoxemic respiratory failure ([place]) 06/14/2020   Hypertension 06/12/2020   Irritable bowel syndrome 02/25/2020   Adenomatous duodenal polyp 09/10/2019   History of GI bleed 09/10/2019   Angina pectoris ([place]) 06/05/2019   Chest pain 06/03/2019   Small intestinal bacterial overgrowth 05/14/2019   Iron deficiency anemia 04/02/2019   GI bleed 03/08/2019   Gastrointestinal hemorrhage with melena 03/06/2019   Acute respiratory failure with hypoxia (HCC) 12/25/2018   Elevated troponin 12/14/2018   Chest pain at rest 07/13/2018   Hand steal syndrome ([place]) 08/01/2017   Anemia 07/14/2017   Coronary artery disease  06/05/2017   Mesenteric ischemia (HCC)    Diverticulitis    Enteritis    Complication of vascular access for dialysis 03/19/2017   Preoperative clearance 01/25/2017   H/O non-ST elevation myocardial infarction (NSTEMI) 10/24/2016   Fluid overload 95/03/3266   Complication from renal dialysis device 10/10/2016   Non-ST elevation MI (NSTEMI) ([place])    Encounter for fitting and adjustment of vascular catheter    Heme positive stool    Demand ischemia ([place]) 07/27/2016   Hypertensive emergency 07/08/2016   Acute on chronic respiratory failure with hypoxia [facility])    Cardiac arrest [facility])    Palliative care encounter    Goals of care, counseling/discussion    Hypertensive crisis without congestive heart failure 05/09/2016   Flash pulmonary edema ([place]) 04/06/2016   Acute respiratory failure ([place]) 04/06/2016   Hypertensive crisis 01/27/2016   History of colon cancer 01/27/2016   History of ovarian cancer 01/27/2016   Hypertensive urgency 01/27/2016   Paroxysmal atrial fibrillation ([place]) 10/14/2015   Coronary angioplasty status 10/14/2015   Malignant neoplasm of right ovary ([place]) 10/14/2015   Narrow complex tachycardia ([place]) 09/08/2015   SVT (supraventricular tachycardia) ([place]) 09/08/2015   Influenza A 08/30/2015   Acute on chronic diastolic CHF (congestive heart failure) ([place]) 05/04/2015   Unstable angina ([place]) 05/03/2015   DOE (dyspnea on exertion)    Essential hypertension    Hyponatremia 10/01/2014   Pain in joint, lower leg 08/14/2014   Dacryocystitis 05/29/2013   Chronic diastolic CHF (congestive heart failure) ([place]) 03/22/2013   GI bleeding 03/21/2013   Acute post-hemorrhagic anemia 03/21/2013   Occlusion and stenosis of carotid artery without mention of cerebral infarction 01/24/2013   Hx of CABG 07/05/2012   Carotid artery disease ([place]) 07/05/2012   Mitral regurgitation 06/12/2012   Pneumonia 06/09/2012   Non-STEMI (non-ST elevated myocardial infarction) ([place]) 06/08/2012    Ischemic chest pain ([place]) 03/01/2012   AVM (arteriovenous malformation) of small bowel, acquired 01/20/2012   GERD (gastroesophageal reflux disease) 01/09/2012   HLD (hyperlipidemia) 01/05/2012   Atherosclerotic heart disease of native coronary artery without angina pectoris 12/16/2011   Essential hypertension, benign 12/16/2011   ESRD on hemodialysis ([place]) 12/16/2011   Anxiety disorder 05/04/2011   Anemia in chronic kidney disease 04/29/2011   ESRD (end stage renal disease) on dialysis ([place]) 04/29/2011   Gout 04/29/2011   Hypertensive chronic kidney disease with stage 5 chronic kidney disease or end stage renal disease ([place]) 04/29/2011    Past Surgical History:  Procedure Laterality Date   A/V SHUNTOGRAM Left 03/19/2019   Procedure: A/V SHUNTOGRAM;  Surgeon: Katha Cabal, MD;  Location: [place] CV LAB;  Service: Cardiovascular;  Laterality: Left;   ABDOMINAL HYSTERECTOMY  1992   APPENDECTOMY  06/1990   AV FISTULA PLACEMENT  07/2009   left upper arm   AV FISTULA  PLACEMENT Right 09/06/2016   Procedure: RIGHT FOREARM ARTERIOVENOUS (AV) GRAFT;  Surgeon: Elam Dutch, MD;  Location: [facility] OR;  Service: Vascular;  Laterality: Right;   AV FISTULA PLACEMENT N/A 02/24/2017   Procedure: INSERTION OF ARTERIOVENOUS (AV) GORE-TEX GRAFT ARM (BRACHIAL AXILLARY);  Surgeon: Katha Cabal, MD;  Location: [facility] ORS;  Service: Vascular;  Laterality: N/A;   [place] Right 09/06/2016   Procedure: REMOVAL OF Right Arm ARTERIOVENOUS GORETEX GRAFT and Vein Patch angioplasty of brachial artery;  Surgeon: Angelia Mould, MD;  Location: [place];  Service: Vascular;  Laterality: Right;   BIOPSY  09/26/2019   Procedure: BIOPSY;  Surgeon: Rogene Houston, MD;  [facility];  Service: Endoscopy;;   COLON RESECTION  1992   COLON SURGERY     COLONOSCOPY N/A 03/09/2019   Procedure: COLONOSCOPY;  Surgeon: Rogene Houston, MD;  [facility];  Service: Endoscopy;  Laterality:  N/A;   CORONARY ANGIOPLASTY WITH STENT PLACEMENT  12/15/11   "2"   CORONARY ANGIOPLASTY WITH STENT PLACEMENT  y/2013   "1; makes total of 3" (05/02/2012)   CORONARY ARTERY BYPASS GRAFT  06/13/2012   Procedure: CORONARY ARTERY BYPASS GRAFTING (CABG);  Surgeon: Grace Isaac, MD;  Location: [place];  Service: Open Heart Surgery;  Laterality: N/A;  cabg x four;  using left internal mammary artery, and left leg greater saphenous vein harvested endoscopically   CORONARY STENT INTERVENTION N/A 10/13/2016   Procedure: Coronary Stent Intervention;  Surgeon: Troy Sine, MD;  Location: [place] CV LAB;  Service: Cardiovascular;  Laterality: N/A;   DIALYSIS/PERMA CATHETER REMOVAL N/A 04/18/2017   Procedure: DIALYSIS/PERMA CATHETER REMOVAL;  Surgeon: Katha Cabal, MD;  Location: [place] CV LAB;  Service: Cardiovascular;  Laterality: N/A;   DILATION AND CURETTAGE OF UTERUS     ESOPHAGOGASTRODUODENOSCOPY  01/20/2012   Procedure: ESOPHAGOGASTRODUODENOSCOPY (EGD);  Surgeon: Ladene Artist, MD,FACG;  Location: [facility] ENDOSCOPY;  Service: Endoscopy;  Laterality: N/A;   ESOPHAGOGASTRODUODENOSCOPY N/A 03/26/2013   Procedure: ESOPHAGOGASTRODUODENOSCOPY (EGD);  Surgeon: Irene Shipper, MD;  Location: [facility] ENDOSCOPY;  Service: Endoscopy;  Laterality: N/A;   ESOPHAGOGASTRODUODENOSCOPY N/A 04/30/2015   Procedure: ESOPHAGOGASTRODUODENOSCOPY (EGD);  Surgeon: Rogene Houston, MD;  [facility];  Service: Endoscopy;  Laterality: N/A;  1pm - moved to 10/20 @ 1:10   ESOPHAGOGASTRODUODENOSCOPY N/A 07/29/2016   Procedure: ESOPHAGOGASTRODUODENOSCOPY (EGD);  Surgeon: Manus Gunning, MD;  Location: [place];  Service: Gastroenterology;  Laterality: N/A;  enteroscopy   ESOPHAGOGASTRODUODENOSCOPY N/A 09/26/2019   Procedure: ESOPHAGOGASTRODUODENOSCOPY (EGD);  Surgeon: Rogene Houston, MD;  [facility];  Service: Endoscopy;  Laterality: N/A;  1250   ESOPHAGOGASTRODUODENOSCOPY (EGD) WITH  PROPOFOL N/A 02/05/2021   Procedure: ESOPHAGOGASTRODUODENOSCOPY (EGD) WITH PROPOFOL;  Surgeon: Eloise Harman, DO;  [facility];  Service: Endoscopy;  Laterality: N/A;   GIVENS CAPSULE STUDY N/A 03/07/2019   Procedure: GIVENS CAPSULE STUDY;  Surgeon: Rogene Houston, MD;  [facility];  Service: Endoscopy;  Laterality: N/A;  7:30   INSERTION OF DIALYSIS CATHETER N/A 10/05/2020   Procedure: ABORTED TUNNELED DIALYSIS CATHETER PLACEMENT RIGHT INTERNAL JUGULAR VEIN ;  Surgeon: Virl Cagey, MD;  Location: [facility] OR[facility];  Service: General;  Laterality: N/A;   INTRAOPERATIVE TRANSESOPHAGEAL ECHOCARDIOGRAM  06/13/2012   Procedure: INTRAOPERATIVE TRANSESOPHAGEAL ECHOCARDIOGRAM;  Surgeon: Grace Isaac, MD;  Location: [place];  Service: Open Heart Surgery;  Laterality: N/A;   IR DIALY SHUNT INTRO NEEDLE/INTRACATH INITIAL  W/IMG LEFT Left 10/06/2020   IR FLUORO GUIDE CV LINE RIGHT  06/17/2020   IR GENERIC HISTORICAL  07/26/2016   IR FLUORO GUIDE CV LINE RIGHT 07/26/2016 Greggory Keen, MD [facility]   IR GENERIC HISTORICAL  07/26/2016   IR US GUIDE VASC ACCESS RIGHT 07/26/2016 Greggory Keen, MD [facility]   IR GENERIC HISTORICAL  08/02/2016   IR US GUIDE VASC ACCESS RIGHT 08/02/2016 Greggory Keen, MD [facility]   IR GENERIC HISTORICAL  08/02/2016   IR FLUORO GUIDE CV LINE RIGHT 08/02/2016 Greggory Keen, MD [facility]   IR RADIOLOGY PERIPHERAL GUIDED IV START  03/28/2017   IR REMOVAL TUN CV CATH W/O FL  08/11/2020   IR THROMBECTOMY AV FISTULA W/THROMBOLYSIS INC/SHUNT/IMG LEFT Left 06/17/2020   IR US GUIDE VASC ACCESS LEFT  06/17/2020   IR US GUIDE VASC ACCESS RIGHT  03/28/2017   IR US GUIDE VASC ACCESS RIGHT  06/17/2020   LEFT HEART CATH AND CORONARY ANGIOGRAPHY N/A 09/20/2016   Procedure: Left Heart Cath and Coronary Angiography;  Surgeon: [name], MD;  Location: [place] CV LAB;  Service: Cardiovascular;  Laterality: N/A;   LEFT HEART CATH AND CORS/GRAFTS ANGIOGRAPHY N/A  10/13/2016   Procedure: Left Heart Cath and Cors/Grafts Angiography;  Surgeon: Troy Sine, MD;  Location: [place] CV LAB;  Service: Cardiovascular;  Laterality: N/A;   LEFT HEART CATH AND CORS/GRAFTS ANGIOGRAPHY N/A 07/13/2018   Procedure: LEFT HEART CATH AND CORS/GRAFTS ANGIOGRAPHY;  Surgeon: Martinique, Peter M, MD;  Location: [place] CV LAB;  Service: Cardiovascular;  Laterality: N/A;   LEFT HEART CATHETERIZATION WITH CORONARY ANGIOGRAM N/A 12/15/2011   Procedure: LEFT HEART CATHETERIZATION WITH CORONARY ANGIOGRAM;  Surgeon: Burnell Blanks, MD;  Location: [facility] CATH LAB;  Service: Cardiovascular;  Laterality: N/A;   LEFT HEART CATHETERIZATION WITH CORONARY ANGIOGRAM N/A 01/10/2012   Procedure: LEFT HEART CATHETERIZATION WITH CORONARY ANGIOGRAM;  Surgeon: Peter M Martinique, MD;  Location: [facility] CATH LAB;  Service: Cardiovascular;  Laterality: N/A;   LEFT HEART CATHETERIZATION WITH CORONARY ANGIOGRAM N/A 06/08/2012   Procedure: LEFT HEART CATHETERIZATION WITH CORONARY ANGIOGRAM;  Surgeon: Burnell Blanks, MD;  Location: [facility] CATH LAB;  Service: Cardiovascular;  Laterality: N/A;   LEFT HEART CATHETERIZATION WITH CORONARY/GRAFT ANGIOGRAM N/A 12/10/2013   Procedure: LEFT HEART CATHETERIZATION WITH Beatrix Fetters;  Surgeon: [name], MD;  Location: [facility] CATH LAB;  Service: Cardiovascular;  Laterality: N/A;   OVARY SURGERY     ovarian cancer   POLYPECTOMY  03/09/2019   Procedure: POLYPECTOMY;  Surgeon: Rogene Houston, MD;  [facility];  Service: Endoscopy;;  cecal    POLYPECTOMY N/A 09/26/2019   Procedure: DUODENAL POLYPECTOMY;  Surgeon: Rogene Houston, MD;  [facility];  Service: Endoscopy;  Laterality: N/A;   REVISION OF ARTERIOVENOUS GORETEX GRAFT N/A 02/24/2017   Procedure: REVISION OF ARTERIOVENOUS GORETEX GRAFT (RESECTION);  Surgeon: Katha Cabal, MD;  Location: [facility] ORS;  Service: Vascular;  Laterality: N/A;   REVISON OF ARTERIOVENOUS  FISTULA Left 06/19/2020   Procedure: REVISION OF LEFT UPPER ARM AV GRAFT WITH INTERPOSITION JUMP GRAFT USING 6MM GORE LIMB;  Surgeon: Marty Heck, MD;  Location: [place];  Service: Vascular;  Laterality: Left;   SHUNTOGRAM N/A 10/15/2013   Procedure: Fistulogram;  Surgeon: Serafina Mitchell, MD;  Location: [facility] CATH LAB;  Service: Cardiovascular;  Laterality: N/A;   THROMBECTOMY / ARTERIOVENOUS GRAFT REVISION  2011   left upper arm   TUBAL LIGATION  1980's   UPPER EXTREMITY ANGIOGRAPHY Bilateral 12/06/2016   Procedure: Upper Extremity Angiography;  Surgeon: Katha Cabal, MD;  Location: [place] CV LAB;  Service: Cardiovascular;  Laterality: Bilateral;   UPPER EXTREMITY INTERVENTION Left 06/06/2017   Procedure: UPPER EXTREMITY INTERVENTION;  Surgeon: Katha Cabal, MD;  Location: [place] CV LAB;  Service: Cardiovascular;  Laterality: Left;     OB History     Gravida  2   Para  2   Term      Preterm  2   AB      Living  2      SAB      IAB      Ectopic      Multiple      Live Births              Family History  Problem Relation Age of Onset   Heart disease Mother        Heart Disease before age 66   Hyperlipidemia Mother    Hypertension Mother    Diabetes Mother    Heart attack Mother    Heart disease Father        Heart Disease before age 7   Hyperlipidemia Father    Hypertension Father    Diabetes Father    Diabetes Sister    Hypertension Sister    Diabetes Brother    Hyperlipidemia Brother    Heart attack Brother    Hypertension Sister    Heart attack Brother    Colon cancer Child 53   Other Other        noncontributory for early CAD   Esophageal cancer Neg Hx    Liver disease Neg Hx    Kidney disease Neg Hx    Colon polyps Neg Hx     Social History   Tobacco Use   Smoking status: Never   Smokeless tobacco: Never  Vaping Use   Vaping Use: Never used  Substance Use Topics   Alcohol use: No    Alcohol/week: 0.0  standard drinks   Drug use: No    Home Medications Prior to Admission medications   Medication Sig Start Date End Date Taking? Authorizing Provider  ALPRAZolam Duanne Moron) 0.5 MG tablet Take 0.5 mg by mouth daily as needed. 03/20/21   [provider]  aspirin EC 81 MG tablet Take 81 mg by mouth daily.  09/27/19   Rogene Houston, MD  diclofenac Sodium (VOLTAREN) 1 % GEL Apply 2 g topically 4 (four) times daily as needed. Patient taking differently: Apply 2 g topically 4 (four) times daily as needed (pain). 03/12/21   Long, Wonda Olds, MD  fluticasone (FLONASE) 50 MCG/ACT nasal spray Place 1 spray into both nostrils at bedtime as needed for allergies.     [provider]  hydrOXYzine (VISTARIL) 100 MG capsule Take 100 mg by mouth daily as needed for anxiety or itching. 11/23/20   [provider]  isosorbide mononitrate (IMDUR) 60 MG 24 hr tablet Take 1 tablet (60 mg total) by mouth in the morning and at bedtime. 03/25/21 06/23/21  Arnoldo Lenis, MD  levothyroxine (SYNTHROID) 25 MCG tablet Take 1 tablet (25 mcg total) by mouth daily. 02/02/21   Brita Romp, NP  lidocaine-prilocaine (EMLA) cream Apply 1 application topically every Monday, Wednesday, and Friday. Prior to dialysis    [provider]  loperamide (IMODIUM A-D) 2 MG tablet Take 2 tablets (4 mg total) by mouth daily as  needed for diarrhea or loose stools. 12/17/20   Thurnell Lose, MD  metoprolol tartrate (LOPRESSOR) 25 MG tablet Take 0.5 tablets (12.5 mg total) by mouth 2 (two) times daily. 03/16/21   Barton Dubois, MD  midodrine (PROAMATINE) 5 MG tablet Take Monday, Wednesday, Friday morning BEFORE dialysis 12/22/20   Arnoldo Lenis, MD  multivitamin (RENA-VIT) TABS tablet Take 1 tablet by mouth daily.    [provider]  nitroGLYCERIN (NITROSTAT) 0.4 MG SL tablet Place 1 tablet (0.4 mg total) under the tongue every 5 (five) minutes x 3 doses as needed for chest pain (if no relief after  2nd dose, proceed to the ED for an evaluation or call 911). 02/19/21   Verta Ellen., NP  omeprazole (PRILOSEC) 20 MG capsule Take 1 capsule (20 mg total) by mouth daily. 12/01/20   Rogene Houston, MD  sevelamer carbonate (RENVELA) 800 MG tablet Take 2 tablets (1,600 mg total) by mouth 3 (three) times daily with meals. 06/20/20   Meccariello, Bernita Raisin, DO  simvastatin (ZOCOR) 20 MG tablet Take 20 mg by mouth daily.    [provider]    Allergies    Aspirin, Penicillins, Amlodipine, Bactrim [sulfamethoxazole-trimethoprim], Contrast media [iodinated diagnostic agents], Iron, Nitrofurantoin, Tylenol [acetaminophen], Gabapentin, Hydralazine, Levofloxacin, No known allergies, Ranexa [ranolazine], Dexilant [dexlansoprazole], Levaquin [levofloxacin in d5w], Morphine and related, Plavix [clopidogrel bisulfate], Protonix [pantoprazole sodium], and Venofer [ferric oxide]  Review of Systems   Review of Systems  Constitutional:  Negative for fever.  HENT:  Negative for ear pain.   Eyes:  Negative for pain.  Respiratory:  Negative for cough.   Cardiovascular:  Positive for chest pain.  Gastrointestinal:  Negative for abdominal pain.  Genitourinary:  Negative for flank pain.  Musculoskeletal:  Negative for back pain.  Skin:  Negative for rash.  Neurological:  Negative for headaches.   Physical Exam Updated Vital Signs BP 118/69 (BP Location: Right Arm)   Pulse 99   Temp 98.5 F (36.9 C) (Oral)   Resp 17   Ht 5\' 2"  (1.575 m)   Wt 56.3 kg   SpO2 98%   BMI 22.70 kg/m   Physical Exam Constitutional:      General: She is not in acute distress.    Appearance: Normal appearance.  HENT:     Head: Normocephalic.     Nose: Nose normal.  Eyes:     Extraocular Movements: Extraocular movements intact.  Cardiovascular:     Rate and Rhythm: Regular rhythm. Tachycardia present.  Pulmonary:     Effort: Pulmonary effort is normal.  Musculoskeletal:        General: Normal range of  motion.     Cervical back: Normal range of motion.  Neurological:     General: No focal deficit present.     Mental Status: She is alert. Mental status is at baseline.    ED Results / Procedures / Treatments   Labs (all labs ordered are listed, but only abnormal results are displayed) Labs Reviewed  BASIC METABOLIC PANEL - Abnormal; Notable for the following components:      Result Value   Chloride 97 (*)    Glucose, Bld 115 (*)    Creatinine, Ser 4.10 (*)    Calcium 8.6 (*)    GFR, Estimated 10 (*)    All other components within normal limits  CBC - Abnormal; Notable for the following components:   RBC 2.50 (*)    Hemoglobin 8.8 (*)  HCT 28.5 (*)    MCV 114.0 (*)    MCH 35.2 (*)    RDW 24.5 (*)    All other components within normal limits  TROPONIN I (HIGH SENSITIVITY) - Abnormal; Notable for the following components:   Troponin I (High Sensitivity) 483 (*)    All other components within normal limits  TROPONIN I (HIGH SENSITIVITY) - Abnormal; Notable for the following components:   Troponin I (High Sensitivity) 458 (*)    All other components within normal limits  RESP PANEL BY RT-PCR (FLU A&B, COVID) [id number]    EKG EKG Interpretation  Date/Time:  Friday April 23 2021 14:50:10 EDT Ventricular Rate:  142 PR Interval:    QRS Duration: 122 QT Interval:  346 QTC Calculation: 532 R Axis:   18 Text Interpretation: Wide QRS tachycardia Left bundle branch block Abnormal ECG Confirmed by Thamas Jaegers ([id number]) on 04/23/2021 3:01:04 PM  Radiology DG Chest Port 1 View  Result Date: 04/23/2021 CLINICAL DATA:  Chest pain during dialysis EXAM: PORTABLE CHEST 1 VIEW COMPARISON:  04/09/2021 FINDINGS: Cardiac enlargement prior CABG. Negative for heart failure. Negative for edema or effusion. Mild bibasilar atelectasis. IMPRESSION: Mild bibasilar atelectasis.  Negative for edema. Electronically Signed   By: Franchot Gallo M.D.   On: 04/23/2021 16:14    Procedures .Critical  Care Performed by: Luna Fuse, MD Authorized by: Luna Fuse, MD   Critical care provider statement:    Critical care time (minutes):  40   Critical care time was exclusive of:  Separately billable procedures and treating other patients   Critical care was necessary to treat or prevent imminent or life-threatening deterioration of the following conditions:  Cardiac failure   Medications Ordered in ED Medications  metoprolol tartrate (LOPRESSOR) injection 5 mg (5 mg Intravenous Given 04/23/21 1535)  aspirin tablet 325 mg (325 mg Oral Given 04/23/21 1715)    ED Course  I have reviewed the triage vital signs and the nursing notes.  Pertinent labs & imaging results that were available during my care of the patient were reviewed by me and considered in my medical decision making (see chart for details).    MDM Rules/Calculators/A&P                           Patient vital signs show regular wide-complex tachycardia, blood pressure is normal.  On exam she is comfortable appearing in no acute distress.  EKG appears similar to prior EKGs, rate of 142 bpm and the bundle branch block pattern, very regular rhythm.  Patient given metoprolol 5 mg IV with subsequent improvement of heart rate in the 70 to 100 bpm range blood pressure remained stable.  Troponin returned elevated at 480.  This appears much higher than her baseline in the past.  Repeat troponin trending downwards at 450.  Case discussed with on-call cardiology, her prior cardiac angiogram shows extensive disease.  Patient be brought into the hospitalist team for further monitoring.   Final Clinical Impression(s) / ED Diagnoses Final diagnoses:  Atrial flutter with rapid ventricular response ([place])  Elevated troponin    Rx / DC Orders ED Discharge Orders     None        Luna Fuse, MD 04/23/21 [phone]

## 2021-04-23 NOTE — Consult Note (Signed)
Cardiology Consultation:   Patient ID: Shawna [name] MRN: [id number]; DOB: Feb 28, 1941  Admit date: 04/23/2021 Date of Consult: 04/23/2021  PCP:  [facility], [place] Providers Cardiologist:  None        Patient Profile:   Shawna [name] is a 80 y.o. female with past medical history of CAD (s/p CABG in 2013, DES to D1 in 10/2016 with cath showing patent LIMA-LAD, SVG-RI, SVG-Mrg and SVG-RCA, cath in 07/2018 showing patent grafts with occlusion of D1 at prior stent site and progression of PDA disease --> medical management recommended), chronic diastolic CHF, carotid artery disease, HTN, HLD, paroxysmal atrial fibrillation (not on anticoagulation given history of GIB), NSVT (on Amiodarone) and ESRD  who is being seen today 04/23/2021 for the evaluation of elevated troponin at the request of hospitalist.  History of Present Illness:   Shawna [name] is a 80 y.o. female with past medical history of CAD (s/p CABG in 2013, DES to D1 in 10/2016 with cath showing patent LIMA-LAD, SVG-RI, SVG-Mrg and SVG-RCA, cath in 07/2018 showing patent grafts with occlusion of D1 at prior stent site and progression of PDA disease --> medical management recommended), chronic diastolic CHF, carotid artery disease, HTN, HLD, paroxysmal atrial fibrillation (not on anticoagulation given history of GIB), NSVT (on Amiodarone) , chronic LBBB, and ESRD presented to the hospital with c/o palpitations/elevated HR  Patient reports  that he had chest pain last night- took SL nitro, aspirin and slept off. She went to the dialysis center today and noted to have high HR (140s), high BP--> thus sent to the ER for eval. She denies any chest pain, sob today and does not feel her HR was elevated.  Review of system otherwise is negative. No heart failure symptoms, dizziness, syncope.  ER work up: initial BP 143/69, HR 140 bpm- EKG: wide QRS, known LBBB, there seems to be retrograde P wave, I am unclear about the  rhythm as I cannot see P waves clearly- could be atrial tachy  trop 483, EKG lbbb. Aspirin was given and patient was transferred to [facility] for further work up.    Relevant Cardiac work up: Relevant CV Studies:   Cardiac Catheterization: 07/2018 Ost RPDA to RPDA lesion is 75% stenosed. Mid LM to Dist LM lesion is 60% stenosed. Ost Ramus to Ramus lesion is 80% stenosed. Ost Cx to Mid Cx lesion is 99% stenosed. Prox LAD lesion is 90% stenosed. Ost 1st Diag to 1st Diag lesion is 100% stenosed. Ost RCA to Prox RCA lesion is 100% stenosed. LIMA graft was visualized by angiography and is large. The graft exhibits no disease. SVG graft was visualized by angiography and is large. The graft exhibits no disease. SVG graft was visualized by angiography and is normal in caliber. SVG graft was visualized by angiography and is large. The graft exhibits no disease. The left ventricular systolic function is normal. LV end diastolic pressure is mildly elevated. The left ventricular ejection fraction is 55-65% by visual estimate.   1. Severe 3 vessel occlusive CAD    - 60% distal left main    - 90% mid LAD    - 100% first diagonal at prior stent site    - 99% proximal LCx at prior stent site    - 80% ramus intermediate.    - 100% ostial RCA at prior stent site    - 75% proximal PDA 2. Patent LIMA to the LAD 3. Patent SVG to ramus intermediate 4. Patent  SVG to OM 5. Patent SVG to the distal RCA. 6. Good LV function 7. Mildly elevated LVEDP  Plan: Compared to prior cath in April 2018 the diagonal is now occluded. There is progression of disease in the PDA. It is unclear if this is the reason for her chest pain. She is severely hypertensive and EDP is mildly elevated. I would recommend medical management with good BP control. If she has refractory angina despite good BP and volume management we could consider PCI of the PDA but this would have to be done through the SVG and in general she has not  responded well to stenting in the past with all prior stents occluded. I will increase her Coreg dose. She will need dialysis in am.     ECHO: 03/24/2021  1. Left ventricular ejection fraction, by estimation, is 50%. The left  ventricle has low normal function. The left ventricle has no regional wall  motion abnormalities. There is mild left ventricular hypertrophy. Left  ventricular diastolic parameters are   consistent with Grade II diastolic dysfunction (pseudonormalization).  Elevated left atrial pressure.   2. Right ventricular systolic function is normal. The right ventricular  size is normal. There is moderately elevated pulmonary artery systolic  pressure.   3. Left atrial size was severely dilated.   4. Right atrial size was mildly dilated.   5. The mitral valve is abnormal. Moderate mitral valve regurgitation.  Mild mitral stenosis. Moderate mitral annular calcification.   6. The tricuspid valve is abnormal. Tricuspid valve regurgitation is  moderate.   7. The aortic valve is tricuspid. There is moderate calcification of the  aortic valve. There is moderate thickening of the aortic valve. Aortic  valve regurgitation is not visualized. No aortic stenosis is present.   8. The inferior vena cava is normal in size with greater than 50%  respiratory variability, suggesting right atrial pressure of 3 mmHg.  Past Medical History:  Diagnosis Date   Acute on chronic respiratory failure with hypoxia ([place]) 10/10/2016   Anxiety    Arthritis    AVM (arteriovenous malformation) of colon    CAD (coronary artery disease)    a. s/p CABG in 2013 b. DES to D1 in 10/2016. c. cath in 07/2018 showing patent grafts with occlusion of D1 at prior stent site and progression of PDA disease --> medical management recommended   Carotid artery disease ([place])    a. 54-65% LICA, 0/3546    Chronic anemia    Chronic bronchitis (HCC)    Chronic diastolic CHF (congestive heart failure) ([place])    a. 02/2012  Echo EF 60-65%, nl wall motion, Gr 1 DD, mod MR   Colon cancer ([place]) 1992   Esophageal stricture    ESRD on hemodialysis ([place])    ESRD due to HTN, started dialysis 2011 and gets HD at [facility] with Dr Hinda Lenis on MWF schedule.  Access is LUA AVF as of Sept 2014.    GERD (gastroesophageal reflux disease)    High cholesterol 12/2011   History of blood transfusion 07/2011; 12/2011; 01/2012 X 2; 04/2012   History of gout    History of lower GI bleeding    Hypertension    Iron deficiency anemia    Jugular vein occlusion, right (HCC)    Mitral regurgitation    a. Moderate by echo, 02/2012   Myocardial infarction [facility])    NSVT (nonsustained ventricular tachycardia)    Ovarian cancer ([place]) 1992   PAF (paroxysmal atrial fibrillation) ([place])  Pneumonia ~ 2009   PUD (peptic ulcer disease)    TIA (transient ischemic attack)     Past Surgical History:  Procedure Laterality Date   A/V SHUNTOGRAM Left 03/19/2019   Procedure: A/V SHUNTOGRAM;  Surgeon: Katha Cabal, MD;  Location: [place] [facility];  Service: Cardiovascular;  Laterality: Left;   ABDOMINAL HYSTERECTOMY  1992   APPENDECTOMY  06/1990   AV FISTULA PLACEMENT  07/2009   left upper arm   AV FISTULA PLACEMENT Right 09/06/2016   Procedure: RIGHT FOREARM ARTERIOVENOUS (AV) GRAFT;  Surgeon: Elam Dutch, MD;  Location: [place];  Service: Vascular;  Laterality: Right;   AV FISTULA PLACEMENT N/A 02/24/2017   Procedure: INSERTION OF ARTERIOVENOUS (AV) GORE-TEX GRAFT ARM (BRACHIAL AXILLARY);  Surgeon: Katha Cabal, MD;  Location: [facility] ORS;  Service: Vascular;  Laterality: N/A;   [place] Right 09/06/2016   Procedure: REMOVAL OF Right Arm ARTERIOVENOUS GORETEX GRAFT and Vein Patch angioplasty of brachial artery;  Surgeon: Angelia Mould, MD;  Location: [place];  Service: Vascular;  Laterality: Right;   BIOPSY  09/26/2019   Procedure: BIOPSY;  Surgeon: Rogene Houston, MD;  [facility];  Service: Endoscopy;;    COLON RESECTION  1992   COLON SURGERY     COLONOSCOPY N/A 03/09/2019   Procedure: COLONOSCOPY;  Surgeon: Rogene Houston, MD;  [facility];  Service: Endoscopy;  Laterality: N/A;   CORONARY ANGIOPLASTY WITH STENT PLACEMENT  12/15/11   "2"   CORONARY ANGIOPLASTY WITH STENT PLACEMENT  y/2013   "1; makes total of 3" (05/02/2012)   CORONARY ARTERY BYPASS GRAFT  06/13/2012   Procedure: CORONARY ARTERY BYPASS GRAFTING (CABG);  Surgeon: Grace Isaac, MD;  Location: [place];  Service: Open Heart Surgery;  Laterality: N/A;  cabg x four;  using left internal mammary artery, and left leg greater saphenous vein harvested endoscopically   CORONARY STENT INTERVENTION N/A 10/13/2016   Procedure: Coronary Stent Intervention;  Surgeon: Troy Sine, MD;  Location: [place] [facility];  Service: Cardiovascular;  Laterality: N/A;   DIALYSIS/PERMA CATHETER REMOVAL N/A 04/18/2017   Procedure: DIALYSIS/PERMA CATHETER REMOVAL;  Surgeon: Katha Cabal, MD;  Location: [place] [facility];  Service: Cardiovascular;  Laterality: N/A;   DILATION AND CURETTAGE OF UTERUS     ESOPHAGOGASTRODUODENOSCOPY  01/20/2012   Procedure: ESOPHAGOGASTRODUODENOSCOPY (EGD);  Surgeon: Ladene Artist, MD,FACG;  Location: [facility] ENDOSCOPY;  Service: Endoscopy;  Laterality: N/A;   ESOPHAGOGASTRODUODENOSCOPY N/A 03/26/2013   Procedure: ESOPHAGOGASTRODUODENOSCOPY (EGD);  Surgeon: Irene Shipper, MD;  Location: [facility] ENDOSCOPY;  Service: Endoscopy;  Laterality: N/A;   ESOPHAGOGASTRODUODENOSCOPY N/A 04/30/2015   Procedure: ESOPHAGOGASTRODUODENOSCOPY (EGD);  Surgeon: Rogene Houston, MD;  [facility];  Service: Endoscopy;  Laterality: N/A;  1pm - moved to 10/20 @ 1:10   ESOPHAGOGASTRODUODENOSCOPY N/A 07/29/2016   Procedure: ESOPHAGOGASTRODUODENOSCOPY (EGD);  Surgeon: Manus Gunning, MD;  Location: [place];  Service: Gastroenterology;  Laterality: N/A;  enteroscopy   ESOPHAGOGASTRODUODENOSCOPY N/A 09/26/2019    Procedure: ESOPHAGOGASTRODUODENOSCOPY (EGD);  Surgeon: Rogene Houston, MD;  [facility];  Service: Endoscopy;  Laterality: N/A;  1250   ESOPHAGOGASTRODUODENOSCOPY (EGD) WITH PROPOFOL N/A 02/05/2021   Procedure: ESOPHAGOGASTRODUODENOSCOPY (EGD) WITH PROPOFOL;  Surgeon: Eloise Harman, DO;  [facility];  Service: Endoscopy;  Laterality: N/A;   GIVENS CAPSULE STUDY N/A 03/07/2019   Procedure: GIVENS CAPSULE STUDY;  Surgeon: Rogene Houston, MD;  [facility];  Service: Endoscopy;  Laterality: N/A;  7:30   INSERTION OF DIALYSIS CATHETER N/A 10/05/2020   Procedure: ABORTED TUNNELED DIALYSIS CATHETER PLACEMENT RIGHT INTERNAL JUGULAR VEIN ;  Surgeon: Virl Cagey, MD;  Location: [facility];  Service: General;  Laterality: N/A;   INTRAOPERATIVE TRANSESOPHAGEAL ECHOCARDIOGRAM  06/13/2012   Procedure: INTRAOPERATIVE TRANSESOPHAGEAL ECHOCARDIOGRAM;  Surgeon: Grace Isaac, MD;  Location: [place];  Service: Open Heart Surgery;  Laterality: N/A;   IR DIALY SHUNT INTRO NEEDLE/INTRACATH INITIAL W/IMG LEFT Left 10/06/2020   IR FLUORO GUIDE CV LINE RIGHT  06/17/2020   IR GENERIC HISTORICAL  07/26/2016   IR FLUORO GUIDE CV LINE RIGHT 07/26/2016 Greggory Keen, MD [facility]   IR GENERIC HISTORICAL  07/26/2016   IR US GUIDE VASC ACCESS RIGHT 07/26/2016 Greggory Keen, MD [facility]   IR GENERIC HISTORICAL  08/02/2016   IR US GUIDE VASC ACCESS RIGHT 08/02/2016 Greggory Keen, MD [facility]   IR GENERIC HISTORICAL  08/02/2016   IR FLUORO GUIDE CV LINE RIGHT 08/02/2016 Greggory Keen, MD [facility]   IR RADIOLOGY PERIPHERAL GUIDED IV START  03/28/2017   IR REMOVAL TUN CV CATH W/O FL  08/11/2020   IR THROMBECTOMY AV FISTULA W/THROMBOLYSIS INC/SHUNT/IMG LEFT Left 06/17/2020   IR US GUIDE VASC ACCESS LEFT  06/17/2020   IR US GUIDE VASC ACCESS RIGHT  03/28/2017   IR US GUIDE VASC ACCESS RIGHT  06/17/2020   LEFT HEART CATH AND CORONARY ANGIOGRAPHY N/A 09/20/2016   Procedure: Left Heart  Cath and Coronary Angiography;  Surgeon: [name], MD;  Location: [place] [facility];  Service: Cardiovascular;  Laterality: N/A;   LEFT HEART CATH AND CORS/GRAFTS ANGIOGRAPHY N/A 10/13/2016   Procedure: Left Heart Cath and Cors/Grafts Angiography;  Surgeon: Troy Sine, MD;  Location: [place] [facility];  Service: Cardiovascular;  Laterality: N/A;   LEFT HEART CATH AND CORS/GRAFTS ANGIOGRAPHY N/A 07/13/2018   Procedure: LEFT HEART CATH AND CORS/GRAFTS ANGIOGRAPHY;  Surgeon: Martinique, Peter M, MD;  Location: [place] [facility];  Service: Cardiovascular;  Laterality: N/A;   LEFT HEART CATHETERIZATION WITH CORONARY ANGIOGRAM N/A 12/15/2011   Procedure: LEFT HEART CATHETERIZATION WITH CORONARY ANGIOGRAM;  Surgeon: Burnell Blanks, MD;  Location: [facility] CATH LAB;  Service: Cardiovascular;  Laterality: N/A;   LEFT HEART CATHETERIZATION WITH CORONARY ANGIOGRAM N/A 01/10/2012   Procedure: LEFT HEART CATHETERIZATION WITH CORONARY ANGIOGRAM;  Surgeon: Peter M Martinique, MD;  Location: [facility] CATH LAB;  Service: Cardiovascular;  Laterality: N/A;   LEFT HEART CATHETERIZATION WITH CORONARY ANGIOGRAM N/A 06/08/2012   Procedure: LEFT HEART CATHETERIZATION WITH CORONARY ANGIOGRAM;  Surgeon: Burnell Blanks, MD;  Location: [facility] CATH LAB;  Service: Cardiovascular;  Laterality: N/A;   LEFT HEART CATHETERIZATION WITH CORONARY/GRAFT ANGIOGRAM N/A 12/10/2013   Procedure: LEFT HEART CATHETERIZATION WITH Beatrix Fetters;  Surgeon: [name], MD;  Location: [facility] CATH LAB;  Service: Cardiovascular;  Laterality: N/A;   OVARY SURGERY     ovarian cancer   POLYPECTOMY  03/09/2019   Procedure: POLYPECTOMY;  Surgeon: Rogene Houston, MD;  [facility];  Service: Endoscopy;;  cecal    POLYPECTOMY N/A 09/26/2019   Procedure: DUODENAL POLYPECTOMY;  Surgeon: Rogene Houston, MD;  [facility];  Service: Endoscopy;  Laterality: N/A;   REVISION OF ARTERIOVENOUS GORETEX GRAFT N/A 02/24/2017    Procedure: REVISION OF ARTERIOVENOUS GORETEX GRAFT (RESECTION);  Surgeon: Katha Cabal, MD;  Location: [facility] ORS;  Service: Vascular;  Laterality: N/A;   REVISON  OF ARTERIOVENOUS FISTULA Left 06/19/2020   Procedure: REVISION OF LEFT UPPER ARM AV GRAFT WITH INTERPOSITION JUMP GRAFT USING 6MM GORE LIMB;  Surgeon: Marty Heck, MD;  Location: [place];  Service: Vascular;  Laterality: Left;   SHUNTOGRAM N/A 10/15/2013   Procedure: Fistulogram;  Surgeon: Serafina Mitchell, MD;  Location: [facility] CATH LAB;  Service: Cardiovascular;  Laterality: N/A;   THROMBECTOMY / ARTERIOVENOUS GRAFT REVISION  2011   left upper arm   TUBAL LIGATION  1980's   UPPER EXTREMITY ANGIOGRAPHY Bilateral 12/06/2016   Procedure: Upper Extremity Angiography;  Surgeon: Katha Cabal, MD;  Location: [place] [facility];  Service: Cardiovascular;  Laterality: Bilateral;   UPPER EXTREMITY INTERVENTION Left 06/06/2017   Procedure: UPPER EXTREMITY INTERVENTION;  Surgeon: Katha Cabal, MD;  Location: [place] [facility];  Service: Cardiovascular;  Laterality: Left;     Home Medications:  Prior to Admission medications   Medication Sig Start Date End Date Taking? Authorizing Provider  gabapentin (NEURONTIN) 100 MG capsule Take by mouth. 01/28/21  Yes [provider]  hydrOXYzine (VISTARIL) 100 MG capsule Take 100 mg by mouth daily as needed for anxiety or itching. 11/23/20  Yes [provider]  isosorbide mononitrate (IMDUR) 60 MG 24 hr tablet Take 1 tablet (60 mg total) by mouth in the morning and at bedtime. 03/25/21 06/23/21 Yes Branch, Alphonse Guild, MD  ALPRAZolam Duanne Moron) 0.5 MG tablet Take 0.5 mg by mouth daily as needed. 03/20/21   [provider]  aspirin EC 81 MG tablet Take 81 mg by mouth daily.  09/27/19   Rogene Houston, MD  diclofenac Sodium (VOLTAREN) 1 % GEL Apply 2 g topically 4 (four) times daily as needed. Patient taking differently: Apply 2 g topically 4 (four) times daily as  needed (pain). 03/12/21   Long, Wonda Olds, MD  fluticasone (FLONASE) 50 MCG/ACT nasal spray Place 1 spray into both nostrils at bedtime as needed for allergies.     [provider]  isosorbide mononitrate (IMDUR) 30 MG 24 hr tablet isosorbide mononitrate ER 30 mg tablet,extended release 24 hr  TAKE 3 TABLETS BY MOUTH TWICE DAILY    [provider]  levothyroxine (SYNTHROID) 25 MCG tablet Take 1 tablet (25 mcg total) by mouth daily. 02/02/21   Brita Romp, NP  lidocaine-prilocaine (EMLA) cream Apply 1 application topically every Monday, Wednesday, and Friday. Prior to dialysis    [provider]  loperamide (IMODIUM A-D) 2 MG tablet Take 2 tablets (4 mg total) by mouth daily as needed for diarrhea or loose stools. 12/17/20   Thurnell Lose, MD  metoprolol tartrate (LOPRESSOR) 25 MG tablet Take 0.5 tablets (12.5 mg total) by mouth 2 (two) times daily. 03/16/21   Barton Dubois, MD  midodrine (PROAMATINE) 5 MG tablet Take Monday, Wednesday, Friday morning BEFORE dialysis 12/22/20   Arnoldo Lenis, MD  multivitamin (RENA-VIT) TABS tablet Take 1 tablet by mouth daily.    [provider]  nitroGLYCERIN (NITROSTAT) 0.4 MG SL tablet Place 1 tablet (0.4 mg total) under the tongue every 5 (five) minutes x 3 doses as needed for chest pain (if no relief after 2nd dose, proceed to the ED for an evaluation or call 911). 02/19/21   Verta Ellen., NP  omeprazole (PRILOSEC) 20 MG capsule Take 1 capsule (20 mg total) by mouth daily. 12/01/20   Rogene Houston, MD  sevelamer carbonate (RENVELA) 800 MG tablet Take 2 tablets (1,600 mg total) by mouth  3 (three) times daily with meals. 06/20/20   Meccariello, Bernita Raisin, DO  simvastatin (ZOCOR) 20 MG tablet Take 20 mg by mouth daily.    [provider]    Inpatient Medications: Scheduled Meds:  Continuous Infusions:  PRN Meds:   Allergies:    Allergies  Allergen Reactions   Aspirin Other (See Comments)     High Doses Mess up her stomach; "makes my bowels have blood in them". Takes 81 mg EC Aspirin    Penicillins Other (See Comments)    SYNCOPE? , "makes me real weak when I take it; like I'll pass out"  Has patient had a PCN reaction causing immediate rash, facial/tongue/throat swelling, SOB or lightheadedness with hypotension: Yes Has patient had a PCN reaction causing severe rash involving mucus membranes or skin necrosis: no Has patient had a PCN reaction that required hospitalization no Has patient had a PCN reaction occurring within the last 10 years: no If all of the above   Amlodipine Swelling   Bactrim [Sulfamethoxazole-Trimethoprim] Rash   Contrast Media [Iodinated Diagnostic Agents] Itching   Iron Itching and Other (See Comments)    "they gave me iron in dialysis; had to give me Benadryl cause I had to have the iron" (05/02/2012)   Nitrofurantoin Hives   Tylenol [Acetaminophen] Itching and Other (See Comments)    Makes her feet on fire per pt   Gabapentin Other (See Comments)    Unknown reaction   Hydralazine Itching    Has tolerated while inpatient   Levofloxacin    No Known Allergies    Ranexa [Ranolazine] Other (See Comments)    Myoclonus-hospitalized    Dexilant [Dexlansoprazole] Other (See Comments)    Upset stomach   Levaquin [Levofloxacin In D5w] Rash   Morphine And Related Itching and Other (See Comments)    Itching in feet   Plavix [Clopidogrel Bisulfate] Rash   Protonix [Pantoprazole Sodium] Rash   Venofer [Ferric Oxide] Itching and Other (See Comments)    Patient reports using Benadryl prior to doses as [place] History:   Social History   Socioeconomic History   Marital status: Married    Spouse name: [name]   Number of children: Not on file   Years of education: Not on file   Highest education level: Not on file  Occupational History   Not on file  Tobacco Use   Smoking status: Never   Smokeless tobacco: Never  Vaping Use    Vaping Use: Never used  Substance and Sexual Activity   Alcohol use: No    Alcohol/week: 0.0 standard drinks   Drug use: No   Sexual activity: Yes    Birth control/protection: Surgical  Other Topics Concern   Not on file  Social History Narrative   Lives in [place], [place] with husband.  Dialysis pt - mwf.   Social Determinants of Health   Financial Resource Strain: Not on file  Food Insecurity: Not on file  Transportation Needs: Not on file  Physical Activity: Not on file  Stress: Not on file  Social Connections: Not on file  Intimate Partner Violence: Not on file    Family History:    Family History  Problem Relation Age of Onset   Heart disease Mother        Heart Disease before age 66   Hyperlipidemia Mother    Hypertension Mother    Diabetes Mother    Heart attack Mother    Heart disease Father  Heart Disease before age 46   Hyperlipidemia Father    Hypertension Father    Diabetes Father    Diabetes Sister    Hypertension Sister    Diabetes Brother    Hyperlipidemia Brother    Heart attack Brother    Hypertension Sister    Heart attack Brother    Colon cancer Child 58   Other Other        noncontributory for early CAD   Esophageal cancer Neg Hx    Liver disease Neg Hx    Kidney disease Neg Hx    Colon polyps Neg Hx      ROS:  Please see the history of present illness.  All other ROS reviewed and negative.     Physical Exam/Data:   Vitals:   04/23/21 1930 04/23/21 2000 04/23/21 2034 04/23/21 2156  BP: 129/77  135/71 (!) 142/80  Pulse:  97 (!) 104 (!) 130  Resp: 18 17 15 20   Temp:  98.2 F (36.8 C)  97.6 F (36.4 C)  TempSrc:  Oral  Oral  SpO2:   93% 100%  Weight:    123.6 kg  Height:    5\' 2"  (1.575 m)   No intake or output data in the 24 hours ending 04/23/21 2313 Last 3 Weights 04/23/2021 04/23/2021 04/10/2021  Weight (lbs) 272 lb 7.8 oz 124 lb 1.9 oz 124 lb 1.9 oz  Weight (kg) 123.6 kg 56.3 kg 56.3 kg     Body mass index is 49.84  kg/m.  General:  Well nourished, well developed, in no acute distress HEENT: normal Neck: no JVD Vascular: No carotid bruits; Distal pulses 2+ bilaterally Cardiac:  normal S1, S2; RRR; no murmur  Lungs:  clear to auscultation bilaterally, no wheezing, rhonchi or rales  Abd: soft, nontender, no hepatomegaly  Ext: no edema Musculoskeletal:  No deformities, BUE and BLE strength normal and equal Skin: warm and dry  Neuro:  CNs 2-12 intact, no focal abnormalities noted Psych:  Normal affect   EKG:  The EKG was personally reviewed and demonstrates:  as noted above  Relevant CV Studies: As above  Laboratory Data:  High Sensitivity Troponin:   Recent Labs  Lab 04/09/21 1212 04/09/21 1335 04/23/21 1445 04/23/21 1634  TROPONINIHS 156* 167* 483* 458*     Chemistry Recent Labs  Lab 04/23/21 1445  NA 136  K 3.8  CL 97*  CO2 30  GLUCOSE 115*  BUN 16  CREATININE 4.10*  CALCIUM 8.6*  GFRNONAA 10*  ANIONGAP 9    No results for input(s): PROT, ALBUMIN, AST, ALT, ALKPHOS, BILITOT in the last 168 hours. Lipids No results for input(s): CHOL, TRIG, HDL, LABVLDL, LDLCALC, CHOLHDL in the last 168 hours.  Hematology Recent Labs  Lab 04/23/21 1445  WBC 9.8  RBC 2.50*  HGB 8.8*  HCT 28.5*  MCV 114.0*  MCH 35.2*  MCHC 30.9  RDW 24.5*  PLT 259   Thyroid No results for input(s): TSH, FREET4 in the last 168 hours.  BNPNo results for input(s): BNP, PROBNP in the last 168 hours.  DDimer No results for input(s): DDIMER in the last 168 hours.   Radiology/Studies:  DG Chest Port 1 View  Result Date: 04/23/2021 CLINICAL DATA:  Chest pain during dialysis EXAM: PORTABLE CHEST 1 VIEW COMPARISON:  04/09/2021 FINDINGS: Cardiac enlargement prior CABG. Negative for heart failure. Negative for edema or effusion. Mild bibasilar atelectasis. IMPRESSION: Mild bibasilar atelectasis.  Negative for edema. Electronically Signed   By: Juanda Crumble  Carlis Abbott M.D.   On: 04/23/2021 16:14     Assessment and  Plan:   Chest pain/ Troponin elevation (483/458) Palpitations (Wide complex rhtyhm, underling LBBB) likely SVT with abberancy (could be atrial tach) CAD s/p CABG 2013 ([facility] 2020- patent LIMA-LAD, SVG-RI, SVG-Mrg and SVG-RCA, occlusion of D1 at prior stent site and progression of PDA disease)-> medical mx ESRD on HD Chronic HFpEF HTN, HLD P. Afib (not on AC)  H/o GI bleeds NSVT on Amiodarone   Plan:  - Would treat her medically, the trop is downtrending and do not suggestive of ACS. Moreover, she does not have chest pain today and her chest pain seems to be stable. Uptitrate betablocker and antianginal therapies.   - continue aspirin 81mg , imdur  90mg  BID, increase metoprolol to 50mg  BID, continue other home meds. Did not tolerate Ranexa in the past. - h/o GIB and s/p capsule endoscopy yesterday - repeat EKG now, continous telemetry - replace electrolytes - continue home medications   Will continue to follow  Risk Assessment/Risk Scores:                For questions or updates, please contact [place] Please consult [email or web address] for contact info under    Signed, Renae Fickle, MD  04/23/2021 11:13 PM

## 2021-04-23 NOTE — H&P (Signed)
TRH H&P   Patient Demographics:    Shawna [name], is a 80 y.o. female  MRN: [id number]   DOB - 1941/07/06  Admit Date - 04/23/2021  Outpatient Primary MD for the patient is Practice, [facility]  Referring MD/NP/PA: Dr Almyra Free  Outpatient Specialists: Cardiology Dr branch    Patient coming from: HD center  Chief Complaint  Patient presents with   Chest Pain      HPI:    Shawna [name]  is a 80 y.o. female,  with medical history significant of ESRD MWF, hypertension, chronic anemia, history of AVN of the colon, chronic diastolic heart failure, gastroesophageal reflux disease, hyperlipidemia and paroxysmal atrial fibrillation (not on chronic anticoagulation), just had capsule endoscopy by Dr. Laural Golden yesterday. -Patient was sent to ED from hemodialysis center for chest palpitation, and reports chest pain yesterday at home, resolved without any intervention, she was getting her hemodialysis today, and in the middle of her session she was noted to have tachycardia, able to finish her dialysis, she continued to be tachycardic, so she was sent to ED, patient reports some palpitation today, no chest pain, but reports she had chest pain yesterday.  Is any dyspnea, fever, chills -NAD patient noted to have wide-complex tachycardia, but she is with left bundle branch block at baseline, patient received metoprolol 5 mg IV, with subsequent improvement of heart rate in the 70s to 100, patient troponins were elevated initially at 48, repeated 450, she is ESRD, baseline creatinine of 150, significant cardiac history, and elevated troponins, ED discussed with cardiology, who recommended admission to [facility] for further work-up.   Review of systems:    In addition to the HPI above, No Fever-chills, No Headache, No changes with Vision or hearing, No problems swallowing food or Liquids, Reports chest  pain yesterday evening, and palpitation today., Cough or Shortness of Breath, No Abdominal pain, No Nausea or Vommitting, Bowel movements are regular, No Blood in stool or Urine, No dysuria, No new skin rashes or bruises, No new joints pains-aches,  No new weakness, tingling, numbness in any extremity, No recent weight gain or loss, No polyuria, polydypsia or polyphagia, No significant Mental Stressors.  A full 10 point Review of Systems was done, except as stated above, all other Review of Systems were negative.   With Past History of the following :    Past Medical History:  Diagnosis Date   Acute on chronic respiratory failure with hypoxia ([place]) 10/10/2016   Anxiety    Arthritis    AVM (arteriovenous malformation) of colon    CAD (coronary artery disease)    a. s/p CABG in 2013 b. DES to D1 in 10/2016. c. cath in 07/2018 showing patent grafts with occlusion of D1 at prior stent site and progression of PDA disease --> medical management recommended   Carotid artery disease ([place])    a. 93-81% LICA, 0/1751  Chronic anemia    Chronic bronchitis (HCC)    Chronic diastolic CHF (congestive heart failure) ([place])    a. 02/2012 Echo EF 60-65%, nl wall motion, Gr 1 DD, mod MR   Colon cancer ([place]) 1992   Esophageal stricture    ESRD on hemodialysis ([place])    ESRD due to HTN, started dialysis 2011 and gets HD at [facility] with Dr Hinda Lenis on MWF schedule.  Access is LUA AVF as of Sept 2014.    GERD (gastroesophageal reflux disease)    High cholesterol 12/2011   History of blood transfusion 07/2011; 12/2011; 01/2012 X 2; 04/2012   History of gout    History of lower GI bleeding    Hypertension    Iron deficiency anemia    Jugular vein occlusion, right (HCC)    Mitral regurgitation    a. Moderate by echo, 02/2012   Myocardial infarction [facility])    NSVT (nonsustained ventricular tachycardia)    Ovarian cancer ([place]) 1992   PAF (paroxysmal atrial fibrillation) ([place])    Pneumonia ~ 2009    PUD (peptic ulcer disease)    TIA (transient ischemic attack)       Past Surgical History:  Procedure Laterality Date   A/V SHUNTOGRAM Left 03/19/2019   Procedure: A/V SHUNTOGRAM;  Surgeon: Katha Cabal, MD;  Location: [place] [facility];  Service: Cardiovascular;  Laterality: Left;   ABDOMINAL HYSTERECTOMY  1992   APPENDECTOMY  06/1990   AV FISTULA PLACEMENT  07/2009   left upper arm   AV FISTULA PLACEMENT Right 09/06/2016   Procedure: RIGHT FOREARM ARTERIOVENOUS (AV) GRAFT;  Surgeon: Elam Dutch, MD;  Location: [place];  Service: Vascular;  Laterality: Right;   AV FISTULA PLACEMENT N/A 02/24/2017   Procedure: INSERTION OF ARTERIOVENOUS (AV) GORE-TEX GRAFT ARM (BRACHIAL AXILLARY);  Surgeon: Katha Cabal, MD;  Location: [facility];  Service: Vascular;  Laterality: N/A;   [place] Right 09/06/2016   Procedure: REMOVAL OF Right Arm ARTERIOVENOUS GORETEX GRAFT and Vein Patch angioplasty of brachial artery;  Surgeon: Angelia Mould, MD;  Location: [place];  Service: Vascular;  Laterality: Right;   BIOPSY  09/26/2019   Procedure: BIOPSY;  Surgeon: Rogene Houston, MD;  [facility];  Service: Endoscopy;;   COLON RESECTION  1992   COLON SURGERY     COLONOSCOPY N/A 03/09/2019   Procedure: COLONOSCOPY;  Surgeon: Rogene Houston, MD;  [facility];  Service: Endoscopy;  Laterality: N/A;   CORONARY ANGIOPLASTY WITH STENT PLACEMENT  12/15/11   "2"   CORONARY ANGIOPLASTY WITH STENT PLACEMENT  y/2013   "1; makes total of 3" (05/02/2012)   CORONARY ARTERY BYPASS GRAFT  06/13/2012   Procedure: CORONARY ARTERY BYPASS GRAFTING (CABG);  Surgeon: Grace Isaac, MD;  Location: [place];  Service: Open Heart Surgery;  Laterality: N/A;  cabg x four;  using left internal mammary artery, and left leg greater saphenous vein harvested endoscopically   CORONARY STENT INTERVENTION N/A 10/13/2016   Procedure: Coronary Stent Intervention;  Surgeon: Troy Sine, MD;  Location:  [place] [facility];  Service: Cardiovascular;  Laterality: N/A;   DIALYSIS/PERMA CATHETER REMOVAL N/A 04/18/2017   Procedure: DIALYSIS/PERMA CATHETER REMOVAL;  Surgeon: Katha Cabal, MD;  Location: [place] [facility];  Service: Cardiovascular;  Laterality: N/A;   DILATION AND CURETTAGE OF UTERUS     ESOPHAGOGASTRODUODENOSCOPY  01/20/2012   Procedure: ESOPHAGOGASTRODUODENOSCOPY (EGD);  Surgeon: Ladene Artist, MD,FACG;  Location: [facility]  ENDOSCOPY;  Service: Endoscopy;  Laterality: N/A;   ESOPHAGOGASTRODUODENOSCOPY N/A 03/26/2013   Procedure: ESOPHAGOGASTRODUODENOSCOPY (EGD);  Surgeon: Irene Shipper, MD;  Location: [facility] ENDOSCOPY;  Service: Endoscopy;  Laterality: N/A;   ESOPHAGOGASTRODUODENOSCOPY N/A 04/30/2015   Procedure: ESOPHAGOGASTRODUODENOSCOPY (EGD);  Surgeon: Rogene Houston, MD;  [facility];  Service: Endoscopy;  Laterality: N/A;  1pm - moved to 10/20 @ 1:10   ESOPHAGOGASTRODUODENOSCOPY N/A 07/29/2016   Procedure: ESOPHAGOGASTRODUODENOSCOPY (EGD);  Surgeon: Manus Gunning, MD;  Location: [place];  Service: Gastroenterology;  Laterality: N/A;  enteroscopy   ESOPHAGOGASTRODUODENOSCOPY N/A 09/26/2019   Procedure: ESOPHAGOGASTRODUODENOSCOPY (EGD);  Surgeon: Rogene Houston, MD;  [facility];  Service: Endoscopy;  Laterality: N/A;  1250   ESOPHAGOGASTRODUODENOSCOPY (EGD) WITH PROPOFOL N/A 02/05/2021   Procedure: ESOPHAGOGASTRODUODENOSCOPY (EGD) WITH PROPOFOL;  Surgeon: Eloise Harman, DO;  [facility];  Service: Endoscopy;  Laterality: N/A;   GIVENS CAPSULE STUDY N/A 03/07/2019   Procedure: GIVENS CAPSULE STUDY;  Surgeon: Rogene Houston, MD;  [facility];  Service: Endoscopy;  Laterality: N/A;  7:30   INSERTION OF DIALYSIS CATHETER N/A 10/05/2020   Procedure: ABORTED TUNNELED DIALYSIS CATHETER PLACEMENT RIGHT INTERNAL JUGULAR VEIN ;  Surgeon: Virl Cagey, MD;  Location: [facility];  Service: General;  Laterality: N/A;    INTRAOPERATIVE TRANSESOPHAGEAL ECHOCARDIOGRAM  06/13/2012   Procedure: INTRAOPERATIVE TRANSESOPHAGEAL ECHOCARDIOGRAM;  Surgeon: Grace Isaac, MD;  Location: [place];  Service: Open Heart Surgery;  Laterality: N/A;   IR DIALY SHUNT INTRO NEEDLE/INTRACATH INITIAL W/IMG LEFT Left 10/06/2020   IR FLUORO GUIDE CV LINE RIGHT  06/17/2020   IR GENERIC HISTORICAL  07/26/2016   IR FLUORO GUIDE CV LINE RIGHT 07/26/2016 Greggory Keen, MD [facility]   IR GENERIC HISTORICAL  07/26/2016   IR US GUIDE VASC ACCESS RIGHT 07/26/2016 Greggory Keen, MD [facility]   IR GENERIC HISTORICAL  08/02/2016   IR US GUIDE VASC ACCESS RIGHT 08/02/2016 Greggory Keen, MD [facility]   IR GENERIC HISTORICAL  08/02/2016   IR FLUORO GUIDE CV LINE RIGHT 08/02/2016 Greggory Keen, MD [facility]   IR RADIOLOGY PERIPHERAL GUIDED IV START  03/28/2017   IR REMOVAL TUN CV CATH W/O FL  08/11/2020   IR THROMBECTOMY AV FISTULA W/THROMBOLYSIS INC/SHUNT/IMG LEFT Left 06/17/2020   IR US GUIDE VASC ACCESS LEFT  06/17/2020   IR US GUIDE VASC ACCESS RIGHT  03/28/2017   IR US GUIDE VASC ACCESS RIGHT  06/17/2020   LEFT HEART CATH AND CORONARY ANGIOGRAPHY N/A 09/20/2016   Procedure: Left Heart Cath and Coronary Angiography;  Surgeon: [name], MD;  Location: [place] [facility];  Service: Cardiovascular;  Laterality: N/A;   LEFT HEART CATH AND CORS/GRAFTS ANGIOGRAPHY N/A 10/13/2016   Procedure: Left Heart Cath and Cors/Grafts Angiography;  Surgeon: Troy Sine, MD;  Location: [place] [facility];  Service: Cardiovascular;  Laterality: N/A;   LEFT HEART CATH AND CORS/GRAFTS ANGIOGRAPHY N/A 07/13/2018   Procedure: LEFT HEART CATH AND CORS/GRAFTS ANGIOGRAPHY;  Surgeon: Martinique, Peter M, MD;  Location: [place] [facility];  Service: Cardiovascular;  Laterality: N/A;   LEFT HEART CATHETERIZATION WITH CORONARY ANGIOGRAM N/A 12/15/2011   Procedure: LEFT HEART CATHETERIZATION WITH CORONARY ANGIOGRAM;  Surgeon: Burnell Blanks, MD;  Location: [facility] CATH LAB;   Service: Cardiovascular;  Laterality: N/A;   LEFT HEART CATHETERIZATION WITH CORONARY ANGIOGRAM N/A 01/10/2012   Procedure: LEFT HEART CATHETERIZATION WITH CORONARY ANGIOGRAM;  Surgeon: Peter M Martinique, MD;  Location: [place] CATH LAB;  Service: Cardiovascular;  Laterality: N/A;   LEFT HEART CATHETERIZATION WITH CORONARY ANGIOGRAM N/A 06/08/2012   Procedure: LEFT HEART CATHETERIZATION WITH CORONARY ANGIOGRAM;  Surgeon: Burnell Blanks, MD;  Location: [facility] CATH LAB;  Service: Cardiovascular;  Laterality: N/A;   LEFT HEART CATHETERIZATION WITH CORONARY/GRAFT ANGIOGRAM N/A 12/10/2013   Procedure: LEFT HEART CATHETERIZATION WITH Beatrix Fetters;  Surgeon: [name], MD;  Location: [facility] CATH LAB;  Service: Cardiovascular;  Laterality: N/A;   OVARY SURGERY     ovarian cancer   POLYPECTOMY  03/09/2019   Procedure: POLYPECTOMY;  Surgeon: Rogene Houston, MD;  [facility];  Service: Endoscopy;;  cecal    POLYPECTOMY N/A 09/26/2019   Procedure: DUODENAL POLYPECTOMY;  Surgeon: Rogene Houston, MD;  [facility];  Service: Endoscopy;  Laterality: N/A;   REVISION OF ARTERIOVENOUS GORETEX GRAFT N/A 02/24/2017   Procedure: REVISION OF ARTERIOVENOUS GORETEX GRAFT (RESECTION);  Surgeon: Katha Cabal, MD;  Location: [facility] ORS;  Service: Vascular;  Laterality: N/A;   REVISON OF ARTERIOVENOUS FISTULA Left 06/19/2020   Procedure: REVISION OF LEFT UPPER ARM AV GRAFT WITH INTERPOSITION JUMP GRAFT USING 6MM GORE LIMB;  Surgeon: Marty Heck, MD;  Location: [place];  Service: Vascular;  Laterality: Left;   SHUNTOGRAM N/A 10/15/2013   Procedure: Fistulogram;  Surgeon: Serafina Mitchell, MD;  Location: [facility] CATH LAB;  Service: Cardiovascular;  Laterality: N/A;   THROMBECTOMY / ARTERIOVENOUS GRAFT REVISION  2011   left upper arm   TUBAL LIGATION  1980's   UPPER EXTREMITY ANGIOGRAPHY Bilateral 12/06/2016   Procedure: Upper Extremity Angiography;  Surgeon: Katha Cabal, MD;   Location: [place] [facility];  Service: Cardiovascular;  Laterality: Bilateral;   UPPER EXTREMITY INTERVENTION Left 06/06/2017   Procedure: UPPER EXTREMITY INTERVENTION;  Surgeon: Katha Cabal, MD;  Location: [place] [facility];  Service: Cardiovascular;  Laterality: Left;      Social History:     Social History   Tobacco Use   Smoking status: Never   Smokeless tobacco: Never  Substance Use Topics   Alcohol use: No    Alcohol/week: 0.0 standard drinks      Family History :     Family History  Problem Relation Age of Onset   Heart disease Mother        Heart Disease before age 27   Hyperlipidemia Mother    Hypertension Mother    Diabetes Mother    Heart attack Mother    Heart disease Father        Heart Disease before age 54   Hyperlipidemia Father    Hypertension Father    Diabetes Father    Diabetes Sister    Hypertension Sister    Diabetes Brother    Hyperlipidemia Brother    Heart attack Brother    Hypertension Sister    Heart attack Brother    Colon cancer Child 65   Other Other        noncontributory for early CAD   Esophageal cancer Neg Hx    Liver disease Neg Hx    Kidney disease Neg Hx    Colon polyps Neg Hx       Home Medications:   Prior to Admission medications   Medication Sig Start Date End Date Taking? Authorizing Provider  gabapentin (NEURONTIN) 100 MG capsule Take by mouth. 01/28/21  Yes [provider]  hydrOXYzine (VISTARIL) 100 MG capsule Take 100 mg by mouth daily  as needed for anxiety or itching. 11/23/20  Yes [provider]  ALPRAZolam Duanne Moron) 0.5 MG tablet Take 0.5 mg by mouth daily as needed. 03/20/21   [provider]  aspirin EC 81 MG tablet Take 81 mg by mouth daily.  09/27/19   Rogene Houston, MD  diclofenac Sodium (VOLTAREN) 1 % GEL Apply 2 g topically 4 (four) times daily as needed. Patient taking differently: Apply 2 g topically 4 (four) times daily as needed (pain). 03/12/21   Long, Wonda Olds, MD  fluticasone (FLONASE) 50 MCG/ACT nasal spray Place 1 spray into both nostrils at bedtime as needed for allergies.     [provider]  isosorbide mononitrate (IMDUR) 30 MG 24 hr tablet isosorbide mononitrate ER 30 mg tablet,extended release 24 hr  TAKE 3 TABLETS BY MOUTH TWICE DAILY    [provider]  isosorbide mononitrate (IMDUR) 60 MG 24 hr tablet Take 1 tablet (60 mg total) by mouth in the morning and at bedtime. 03/25/21 06/23/21  Arnoldo Lenis, MD  levothyroxine (SYNTHROID) 25 MCG tablet Take 1 tablet (25 mcg total) by mouth daily. 02/02/21   Brita Romp, NP  lidocaine-prilocaine (EMLA) cream Apply 1 application topically every Monday, Wednesday, and Friday. Prior to dialysis    [provider]  loperamide (IMODIUM A-D) 2 MG tablet Take 2 tablets (4 mg total) by mouth daily as needed for diarrhea or loose stools. 12/17/20   Thurnell Lose, MD  metoprolol tartrate (LOPRESSOR) 25 MG tablet Take 0.5 tablets (12.5 mg total) by mouth 2 (two) times daily. 03/16/21   Barton Dubois, MD  midodrine (PROAMATINE) 5 MG tablet Take Monday, Wednesday, Friday morning BEFORE dialysis 12/22/20   Arnoldo Lenis, MD  multivitamin (RENA-VIT) TABS tablet Take 1 tablet by mouth daily.    [provider]  nitroGLYCERIN (NITROSTAT) 0.4 MG SL tablet Place 1 tablet (0.4 mg total) under the tongue every 5 (five) minutes x 3 doses as needed for chest pain (if no relief after 2nd dose, proceed to the ED for an evaluation or call 911). 02/19/21   Verta Ellen., NP  omeprazole (PRILOSEC) 20 MG capsule Take 1 capsule (20 mg total) by mouth daily. 12/01/20   Rogene Houston, MD  sevelamer carbonate (RENVELA) 800 MG tablet Take 2 tablets (1,600 mg total) by mouth 3 (three) times daily with meals. 06/20/20   Meccariello, Bernita Raisin, DO  simvastatin (ZOCOR) 20 MG tablet Take 20 mg by mouth daily.    [provider]     Allergies:     Allergies  Allergen  Reactions   Aspirin Other (See Comments)    High Doses Mess up her stomach; "makes my bowels have blood in them". Takes 81 mg EC Aspirin    Penicillins Other (See Comments)    SYNCOPE? , "makes me real weak when I take it; like I'll pass out"  Has patient had a PCN reaction causing immediate rash, facial/tongue/throat swelling, SOB or lightheadedness with hypotension: Yes Has patient had a PCN reaction causing severe rash involving mucus membranes or skin necrosis: no Has patient had a PCN reaction that required hospitalization no Has patient had a PCN reaction occurring within the last 10 years: no If all of the above   Amlodipine Swelling   Bactrim [Sulfamethoxazole-Trimethoprim] Rash   Contrast Media [Iodinated Diagnostic Agents] Itching   Iron Itching and Other (See Comments)    "they gave me iron in dialysis; had to give me  Benadryl cause I had to have the iron" (05/02/2012)   Nitrofurantoin Hives   Tylenol [Acetaminophen] Itching and Other (See Comments)    Makes her feet on fire per pt   Gabapentin Other (See Comments)    Unknown reaction   Hydralazine Itching    Has tolerated while inpatient   Levofloxacin    No Known Allergies    Ranexa [Ranolazine] Other (See Comments)    Myoclonus-hospitalized    Dexilant [Dexlansoprazole] Other (See Comments)    Upset stomach   Levaquin [Levofloxacin In D5w] Rash   Morphine And Related Itching and Other (See Comments)    Itching in feet   Plavix [Clopidogrel Bisulfate] Rash   Protonix [Pantoprazole Sodium] Rash   Venofer [Ferric Oxide] Itching and Other (See Comments)    Patient reports using Benadryl prior to doses as [facility]     Physical Exam:   Vitals  Blood pressure 118/69, pulse 99, temperature 98.5 F (36.9 C), temperature source Oral, resp. rate 17, height 5\' 2"  (1.575 m), weight 56.3 kg, SpO2 98 %.   1. General *frail elderly female, laying in bed in no apparent distress.  2. Normal affect and insight, Not  Suicidal or Homicidal, Awake Alert, Oriented X 3.  3. No F.N deficits, ALL C.Nerves Intact, Strength 5/5 all 4 extremities, Sensation intact all 4 extremities, Plantars down going.  4. Ears and Eyes appear Normal, Conjunctivae clear, PERRLA. Moist Oral Mucosa.  5. Supple Neck, No JVD, No cervical lymphadenopathy appriciated, No Carotid Bruits.  6. Symmetrical Chest wall movement, Good air movement bilaterally, CTAB.  7.  Irregular irregular, No Gallops, Rubs or Murmurs, No Parasternal Heave.  8. Positive Bowel Sounds, Abdomen Soft, No tenderness, No organomegaly appriciated,No rebound -guarding or rigidity.  9.  No Cyanosis, Normal Skin Turgor, No Skin Rash or Bruise.  10. Good muscle tone,  joints appear normal , no effusions, Normal ROM.  11. No Palpable Lymph Nodes in Neck or Axillae     Data Review:    CBC Recent Labs  Lab 04/23/21 1445  WBC 9.8  HGB 8.8*  HCT 28.5*  PLT 259  MCV 114.0*  MCH 35.2*  MCHC 30.9  RDW 24.5*   ------------------------------------------------------------------------------------------------------------------  Chemistries  Recent Labs  Lab 04/23/21 1445  NA 136  K 3.8  CL 97*  CO2 30  GLUCOSE 115*  BUN 16  CREATININE 4.10*  CALCIUM 8.6*   ------------------------------------------------------------------------------------------------------------------ estimated creatinine clearance is 8.5 mL/min (A) (by C-G formula based on SCr of 4.1 mg/dL (H)). ------------------------------------------------------------------------------------------------------------------ No results for input(s): TSH, T4TOTAL, T3FREE, THYROIDAB in the last 72 hours.  Invalid input(s): FREET3  Coagulation profile No results for input(s): INR, PROTIME in the last 168 hours. ------------------------------------------------------------------------------------------------------------------- No results for input(s): DDIMER in the last 72  hours. -------------------------------------------------------------------------------------------------------------------  Cardiac Enzymes No results for input(s): CKMB, TROPONINI, MYOGLOBIN in the last 168 hours.  Invalid input(s): CK ------------------------------------------------------------------------------------------------------------------    Component Value Date/Time   BNP 161.0 (H) 09/29/2020 0112     ---------------------------------------------------------------------------------------------------------------  Urinalysis    Component Value Date/Time   COLORURINE YELLOW 12/16/2012 1919   APPEARANCEUR CLOUDY (A) 12/16/2012 1919   LABSPEC 1.009 12/16/2012 1919   PHURINE 7.5 12/16/2012 1919   GLUCOSEU NEGATIVE 12/16/2012 1919   HGBUR TRACE (A) 12/16/2012 1919   BILIRUBINUR NEGATIVE 12/16/2012 1919   KETONESUR NEGATIVE 12/16/2012 1919   PROTEINUR 100 (A) 12/16/2012 1919   UROBILINOGEN 0.2 12/16/2012 1919   NITRITE NEGATIVE 12/16/2012 1919   LEUKOCYTESUR SMALL (A) 12/16/2012  1919    ----------------------------------------------------------------------------------------------------------------   Imaging Results:    DG Chest Port 1 View  Result Date: 04/23/2021 CLINICAL DATA:  Chest pain during dialysis EXAM: PORTABLE CHEST 1 VIEW COMPARISON:  04/09/2021 FINDINGS: Cardiac enlargement prior CABG. Negative for heart failure. Negative for edema or effusion. Mild bibasilar atelectasis. IMPRESSION: Mild bibasilar atelectasis.  Negative for edema. Electronically Signed   By: Franchot Gallo M.D.   On: 04/23/2021 16:14    My personal review of EKG:  Vent. rate 142 BPM PR interval * ms QRS duration 122 ms QT/QTcB 346/532 ms P-R-T axes * 18 169 Wide QRS tachycardia Left bundle branch block Abnormal ECG  Assessment & Plan:    Active Problems:   Anemia in chronic kidney disease   Chronic diastolic CHF (congestive heart failure) (HCC)   Demand ischemia (HCC)   ESRD  (end stage renal disease) on dialysis (HCC)   Coronary artery disease   Anemia   Chest pain   Diabetes mellitus type 2 in nonobese (HCC)  Chest pain/palpitation/elevated cardiac enzyme -This is most likely related to demand ischemia, in the setting of A. fib with RVR -2 with known underlining significant CAD, with significantly elevated troponin much higher than baseline, so she will be admitted for further work-up -He is currently chest pain-free, received aspirin, as discussed with cardiology no indication for heparin gtt. specially with history of GI bleed -Continue with home medications include Imdur simvastatin and metoprolol -Patient will be admitted to [facility] to be evaluated by cardiology  ESRD -Patient was fully dialyzed today, next session is on Monday, no indication for urgent dialysis, she states till Monday then we will consult renal  Paroxysmal atrial fibrillation with RVR -She is initially in A. fib with RVR, heart rate slowed down with IV metoprolol  - continue with home dose metoprolol -Not anticoagulation candidate due to chronic GI bleed  Chronic diastolic heart failure -Pulsated, volume management with hemodialysis    Gastroesophageal reflux disease - Continue omeprazole   Essential hypertension -Continue Imdur and metoprolol.   Hypothyroidism Continue Synthroid    Hyperlipidemia: continue statin   Type 2 diabetes mellitus non-insulin-dependent with nephropathy Diet controlled.        DVT Prophylaxis Heparin -   SCDs   AM Labs Ordered, also please review Full Orders  Family Communication: Admission, patients condition and plan of care including tests being ordered have been discussed with the patient and Husband at bedside who indicate understanding and agree with the plan and Code Status.  Code Status Full  Likely DC to  Home  Condition GUARDED    Consults called: D/W cards by phone    Admission status: observation    Time spent in  minutes : 60 minutes   Phillips Climes M.D on 04/23/2021 at 8:10 PM   [facility] - Office  [phone]

## 2021-04-23 NOTE — ED Triage Notes (Signed)
Chest pain onset today, sent from dialysis

## 2021-04-24 ENCOUNTER — Encounter (HOSPITAL_COMMUNITY): Payer: Self-pay | Admitting: Internal Medicine

## 2021-04-24 DIAGNOSIS — I4892 Unspecified atrial flutter: Secondary | ICD-10-CM | POA: Diagnosis not present

## 2021-04-24 DIAGNOSIS — I480 Paroxysmal atrial fibrillation: Secondary | ICD-10-CM

## 2021-04-24 DIAGNOSIS — I2510 Atherosclerotic heart disease of native coronary artery without angina pectoris: Secondary | ICD-10-CM | POA: Diagnosis not present

## 2021-04-24 DIAGNOSIS — Z20822 Contact with and (suspected) exposure to covid-19: Secondary | ICD-10-CM | POA: Diagnosis not present

## 2021-04-24 DIAGNOSIS — R778 Other specified abnormalities of plasma proteins: Secondary | ICD-10-CM | POA: Diagnosis not present

## 2021-04-24 DIAGNOSIS — R079 Chest pain, unspecified: Secondary | ICD-10-CM | POA: Diagnosis not present

## 2021-04-24 DIAGNOSIS — I471 Supraventricular tachycardia: Secondary | ICD-10-CM | POA: Diagnosis not present

## 2021-04-24 DIAGNOSIS — Z85038 Personal history of other malignant neoplasm of large intestine: Secondary | ICD-10-CM | POA: Diagnosis not present

## 2021-04-24 LAB — BASIC METABOLIC PANEL
Anion gap: 14 (ref 5–15)
BUN: 22 mg/dL (ref 8–23)
CO2: 23 mmol/L (ref 22–32)
Calcium: 9.2 mg/dL (ref 8.9–10.3)
Chloride: 98 mmol/L (ref 98–111)
Creatinine, Ser: 5.1 mg/dL — ABNORMAL HIGH (ref 0.44–1.00)
GFR, Estimated: 8 mL/min — ABNORMAL LOW (ref >60)
Glucose, Bld: 98 mg/dL (ref 70–99)
Potassium: 4.3 mmol/L (ref 3.5–5.1)
Sodium: 135 mmol/L (ref 135–145)

## 2021-04-24 LAB — CBC
HCT: 37.9 % (ref 36.0–46.0)
Hemoglobin: 12.2 g/dL (ref 12.0–15.0)
MCH: 35.8 pg — ABNORMAL HIGH (ref 26.0–34.0)
MCHC: 32.2 g/dL (ref 30.0–36.0)
MCV: 111.1 fL — ABNORMAL HIGH (ref 80.0–100.0)
Platelets: 199 10*3/uL (ref 150–400)
RBC: 3.41 MIL/uL — ABNORMAL LOW (ref 3.87–5.11)
RDW: 23.9 % — ABNORMAL HIGH (ref 11.5–15.5)
WBC: 6.7 10*3/uL (ref 4.0–10.5)
nRBC: 0 % (ref 0.0–0.2)

## 2021-04-24 LAB — TROPONIN I (HIGH SENSITIVITY): Troponin I (High Sensitivity): 255 ng/L (ref <18)

## 2021-04-24 MED ORDER — METOPROLOL TARTRATE 50 MG PO TABS: 50.0000 mg | ORAL_TABLET | Freq: Two times a day (BID) | ORAL | 0 refills | Status: DC

## 2021-04-24 MED ORDER — AMIODARONE HCL 200 MG PO TABS: 200.0000 mg | ORAL_TABLET | Freq: Every day | ORAL | 0 refills | Status: DC

## 2021-04-24 MED ORDER — AMIODARONE HCL 200 MG PO TABS: 200.0000 mg | ORAL_TABLET | Freq: Every day | ORAL | Status: DC

## 2021-04-24 NOTE — Discharge Summary (Addendum)
Shawna [name] MWU:[id number] DOB: Nov 26, 1940 DOA: 04/23/2021  PCP: [facility]  Admit date: 04/23/2021  Discharge date: 04/24/2021  Admitted From: Home   Disposition:  Home   Recommendations for Outpatient Follow-up:   Follow up with PCP in 1-2 weeks  PCP Please obtain BMP/CBC, 2 view CXR in 1week,  (see Discharge instructions)   PCP Please follow up on the following pending results:    Home Health: None   Equipment/Devices: None  Consultations: Cards Discharge Condition: Stable    CODE STATUS: Full    Diet Recommendation: Renal, low carbohydrate diet with 1.5 L fluid restriction per day.     Chief Complaint  Patient presents with   Chest Pain     Brief history of present illness from the day of admission and additional interim summary    Shawna [name]  is a 80 y.o. female,  with medical history significant of ESRD MWF, hypertension, chronic anemia, history of AVN of the colon, chronic diastolic heart failure, gastroesophageal reflux disease, hyperlipidemia and paroxysmal atrial fibrillation (not on chronic anticoagulation), just had capsule endoscopy by Dr. Laural Golden 2 days ago.  Patient was sent to ED from hemodialysis center for chest palpitation some reported chest pain, she was found to be in A. fib RVR along with mildly elevated troponin, she was sent to [name] for further treatment.                                                                 Hospital Course    Paroxysmal A. fib and RVR.  Presenting with palpitation and some mild chest discomfort.  EKG nonacute with underlying left bundle branch block, in good rate control on oral beta-blocker now, completely symptom-free, troponin elevation not an ACS pattern 1 accounted for mild demand mismatch due to RVR and underlying ESRD, case  discussed with cardiology will be discharged home on aspirin and higher than home dose beta-blocker + Amio per Cards.  Continue statin for secondary prevention along with low-dose aspirin.  Not a anticoagulation candidate due to ongoing GI bleed issues.   ESRD -Patient was fully dialyzed today, next session is on Monday, euvolemic.   Paroxysmal atrial fibrillation with RVR - in rate controlled on oral beta-blocker.  See #1 above.  [name] 2 score is greater than 3 however not anticoagulation candidate due to chronic GI bleed.     Chronic diastolic heart failure - compensated.  50% and stable   Gastroesophageal reflux disease - Continue omeprazole   Essential hypertension -Continue Imdur and metoprolol.  Beta-blocker dose increased.   Hypothyroidism Continue Synthroid    Hyperlipidemia: continue statin   Type 2 diabetes mellitus non-insulin-dependent with nephropathy.  Continue home regimen.   Discharge diagnosis     Active Problems:   Anemia in chronic kidney  disease   Chronic diastolic CHF (congestive heart failure) ([place])   Demand ischemia (HCC)   ESRD (end stage renal disease) on dialysis [facility])   Coronary artery disease   Anemia   Chest pain   Diabetes mellitus type 2 in nonobese [facility])    Discharge instructions    Discharge Instructions     Discharge instructions   Complete by: As directed    Follow with Primary MD [facility] and your Cardiologist in 7 days   Get CBC, CMP, 2 view Chest X ray -  checked next visit within 1 week by Primary MD    Activity: As tolerated with Full fall precautions use walker/cane & assistance as needed  Disposition Home   Diet: Renal - Low Carb, 1.5 Lit/day fluid restriction  Special Instructions: If you have smoked or chewed Tobacco  in the last 2 yrs please stop smoking, stop any regular Alcohol  and or any Recreational drug use.  On your next visit with your primary care physician please Get Medicines reviewed  and adjusted.  Please request your Prim.MD to go over all Hospital Tests and Procedure/Radiological results at the follow up, please get all Hospital records sent to your Prim MD by signing hospital release before you go home.  If you experience worsening of your admission symptoms, develop shortness of breath, life threatening emergency, suicidal or homicidal thoughts you must seek medical attention immediately by calling 911 or calling your MD immediately  if symptoms less severe.  You Must read complete instructions/literature along with all the possible adverse reactions/side effects for all the Medicines you take and that have been prescribed to you. Take any new Medicines after you have completely understood and accpet all the possible adverse reactions/side effects.   Increase activity slowly   Complete by: As directed        Discharge Medications   Allergies as of 04/24/2021       Reactions   Aspirin Other (See Comments)   High Doses Mess up her stomach; "makes my bowels have blood in them". Takes 81 mg EC Aspirin    Penicillins Other (See Comments)   SYNCOPE? , "makes me real weak when I take it; like I'll pass out" Has patient had a PCN reaction causing immediate rash, facial/tongue/throat swelling, SOB or lightheadedness with hypotension: Yes Has patient had a PCN reaction causing severe rash involving mucus membranes or skin necrosis: no Has patient had a PCN reaction that required hospitalization no Has patient had a PCN reaction occurring within the last 10 years: no If all of the above   Amlodipine Swelling   Bactrim [sulfamethoxazole-trimethoprim] Rash   Contrast Media [iodinated Diagnostic Agents] Itching   Iron Itching, Other (See Comments)   "they gave me iron in dialysis; had to give me Benadryl cause I had to have the iron" (05/02/2012)   Nitrofurantoin Hives   Tylenol [acetaminophen] Itching, Other (See Comments)   Makes her feet on fire per pt   Gabapentin  Other (See Comments)   Unknown reaction   Hydralazine Itching   Has tolerated while inpatient   Levofloxacin    No Known Allergies    Ranexa [ranolazine] Other (See Comments)   Myoclonus-hospitalized    Dexilant [dexlansoprazole] Other (See Comments)   Upset stomach   Levaquin [levofloxacin In D5w] Rash   Morphine And Related Itching, Other (See Comments)   Itching in feet   Plavix [clopidogrel Bisulfate] Rash   Protonix [pantoprazole Sodium] Rash   Venofer [ferric  Oxide] Itching, Other (See Comments)   Patient reports using Benadryl prior to doses as [place]        Medication List     TAKE these medications    ALPRAZolam 0.5 MG tablet Commonly known as: XANAX Take 0.5 mg by mouth daily as needed.   amiodarone 200 MG tablet Commonly known as: PACERONE Take 1 tablet (200 mg total) by mouth daily.   aspirin EC 81 MG tablet Take 81 mg by mouth daily.   diclofenac Sodium 1 % Gel Commonly known as: Voltaren Apply 2 g topically 4 (four) times daily as needed. What changed: reasons to take this   fluticasone 50 MCG/ACT nasal spray Commonly known as: FLONASE Place 1 spray into both nostrils at bedtime as needed for allergies.   hydrOXYzine 100 MG capsule Commonly known as: VISTARIL Take 100 mg by mouth daily as needed for anxiety or itching.   isosorbide mononitrate 60 MG 24 hr tablet Commonly known as: IMDUR Take 1 tablet (60 mg total) by mouth in the morning and at bedtime.   levothyroxine 25 MCG tablet Commonly known as: SYNTHROID Take 1 tablet (25 mcg total) by mouth daily.   lidocaine-prilocaine cream Commonly known as: EMLA Apply 1 application topically every Monday, Wednesday, and Friday. Prior to dialysis   loperamide 2 MG tablet Commonly known as: IMODIUM A-D Take 2 tablets (4 mg total) by mouth daily as needed for diarrhea or loose stools.   metoprolol tartrate 50 MG tablet Commonly known as: LOPRESSOR Take 1 tablet (50 mg total) by mouth 2  (two) times daily. What changed:  medication strength how much to take   midodrine 5 MG tablet Commonly known as: PROAMATINE Take Monday, Wednesday, Friday morning BEFORE dialysis   multivitamin Tabs tablet Take 1 tablet by mouth daily.   nitroGLYCERIN 0.4 MG SL tablet Commonly known as: NITROSTAT Place 1 tablet (0.4 mg total) under the tongue every 5 (five) minutes x 3 doses as needed for chest pain (if no relief after 2nd dose, proceed to the ED for an evaluation or call 911).   omeprazole 20 MG capsule Commonly known as: PRILOSEC Take 1 capsule (20 mg total) by mouth daily.   sevelamer carbonate 800 MG tablet Commonly known as: RENVELA Take 2 tablets (1,600 mg total) by mouth 3 (three) times daily with meals.   simvastatin 20 MG tablet Commonly known as: ZOCOR Take 20 mg by mouth daily.         Follow-up Information     [facility]. Schedule an appointment as soon as possible for a visit in 1 week(s).   Why: Follow-up with your cardiologist in 1 week Contact information: [address] [place] [address] [phone]                 Major procedures and Radiology Reports - PLEASE review detailed and final reports thoroughly  -        US Venous Img Lower Unilateral Left  Result Date: 04/09/2021 CLINICAL DATA:  Shortness of breath. Left lower extremity pain and swelling. EXAM: LEFT LOWER EXTREMITY VENOUS DOPPLER ULTRASOUND TECHNIQUE: Gray-scale sonography with graded compression, as well as color Doppler and duplex ultrasound were performed to evaluate the lower extremity deep venous systems from the level of the common femoral vein and including the common femoral, femoral, profunda femoral, popliteal and calf veins including the posterior tibial, peroneal and gastrocnemius veins when visible. The superficial great saphenous vein was also interrogated. Spectral Doppler  was utilized to evaluate flow at rest and with distal augmentation maneuvers  in the common femoral, femoral and popliteal veins. COMPARISON:  None. FINDINGS: Contralateral Common Femoral Vein: Respiratory phasicity is normal and symmetric with the symptomatic side. No evidence of thrombus. Normal compressibility. Common Femoral Vein: No evidence of thrombus. Normal compressibility, respiratory phasicity and response to augmentation. Saphenofemoral Junction: No evidence of thrombus. Normal compressibility and flow on color Doppler imaging. Profunda Femoral Vein: No evidence of thrombus. Normal compressibility and flow on color Doppler imaging. Femoral Vein: No evidence of thrombus. Normal compressibility, respiratory phasicity and response to augmentation. Popliteal Vein: No evidence of thrombus. Normal compressibility, respiratory phasicity and response to augmentation. Calf Veins: Visualized left deep calf veins are patent without thrombus. Superficial Great Saphenous Vein: No evidence of thrombus. Normal compressibility. Other Findings: Small fluid collection in the left popliteal fossa that measures 1.8 x 0.6 x 1.6 cm. Normal compressibility in the visualized left short saphenous vein. Subcutaneous edema in the left ankle and calf. IMPRESSION: 1.  Negative for deep venous thrombosis in left lower extremity. 2. Small left knee Baker's cyst. Electronically Signed   By: Markus Daft M.D.   On: 04/09/2021 13:57   DG Chest Port 1 View  Result Date: 04/23/2021 CLINICAL DATA:  Chest pain during dialysis EXAM: PORTABLE CHEST 1 VIEW COMPARISON:  04/09/2021 FINDINGS: Cardiac enlargement prior CABG. Negative for heart failure. Negative for edema or effusion. Mild bibasilar atelectasis. IMPRESSION: Mild bibasilar atelectasis.  Negative for edema. Electronically Signed   By: Franchot Gallo M.D.   On: 04/23/2021 16:14   DG Chest Port 1 View  Result Date: 04/09/2021 CLINICAL DATA:  sob EXAM: PORTABLE CHEST 1 VIEW COMPARISON:  03/15/2021. FINDINGS: Similar left greater than right basilar  opacities. Probable small bilateral pleural effusions. No visible pneumothorax on this semi erect radiograph. Similar mild enlargement the cardiac silhouette. Median sternotomy and CABG. Similar pulmonary vascular congestion. IMPRESSION: 1. Similar probable small bilateral pleural effusions with overlying left greater than right basilar atelectasis and/or consolidation. 2. Similar cardiomegaly and pulmonary vascular congestion. Electronically Signed   By: Margaretha Sheffield M.D.   On: 04/09/2021 12:31    Micro Results    Recent Results (from the past 240 hour(s))  Resp Panel by RT-PCR (Flu A&B, Covid) Nasopharyngeal Swab     Status: None   Collection Time: 04/23/21  3:54 PM   Specimen: Nasopharyngeal Swab; Nasopharyngeal(NP) swabs in vial transport medium  Result Value Ref Range Status   SARS Coronavirus 2 by RT PCR NEGATIVE NEGATIVE Final    Comment: (NOTE) SARS-CoV-2 target nucleic acids are NOT DETECTED.  The SARS-CoV-2 RNA is generally detectable in upper respiratory specimens during the acute phase of infection. The lowest concentration of SARS-CoV-2 viral copies this assay can detect is 138 copies/mL. A negative result does not preclude SARS-Cov-2 infection and should not be used as the sole basis for treatment or other patient management decisions. A negative result may occur with  improper specimen collection/handling, submission of specimen other than nasopharyngeal swab, presence of viral mutation(s) within the areas targeted by this assay, and inadequate number of viral copies(<138 copies/mL). A negative result must be combined with clinical observations, patient history, and epidemiological information. The expected result is Negative.  Fact Sheet for Patients:  EntrepreneurPulse.com.au  Fact Sheet for Healthcare Providers:  IncredibleEmployment.be  This test is no t yet approved or cleared by the [place] FDA and  has been  authorized for detection and/or diagnosis of SARS-CoV-2 by FDA  under an Emergency Use Authorization (EUA). This EUA will remain  in effect (meaning this test can be used) for the duration of the COVID-19 declaration under Section 564(b)(1) of the Act, 21 U.S.C.section 360bbb-3(b)(1), unless the authorization is terminated  or revoked sooner.       Influenza A by PCR NEGATIVE NEGATIVE Final   Influenza B by PCR NEGATIVE NEGATIVE Final    Comment: (NOTE) The Xpert Xpress SARS-CoV-2/FLU/RSV plus assay is intended as an aid in the diagnosis of influenza from Nasopharyngeal swab specimens and should not be used as a sole basis for treatment. Nasal washings and aspirates are unacceptable for Xpert Xpress SARS-CoV-2/FLU/RSV testing.  Fact Sheet for Patients: EntrepreneurPulse.com.au  Fact Sheet for Healthcare Providers: IncredibleEmployment.be  This test is not yet approved or cleared by the [place] FDA and has been authorized for detection and/or diagnosis of SARS-CoV-2 by FDA under an Emergency Use Authorization (EUA). This EUA will remain in effect (meaning this test can be used) for the duration of the COVID-19 declaration under Section 564(b)(1) of the Act, 21 U.S.C. section 360bbb-3(b)(1), unless the authorization is terminated or revoked.  Performed at [facility], [address], [place], [place] [address]     Today   Subjective    Shawna [name] today has no headache,no chest abdominal pain,no new weakness tingling or numbness, feels much better wants to go home today.    Objective   Blood pressure (!) 144/50, pulse 62, temperature 98.4 F (36.9 C), temperature source Oral, resp. rate 17, height 5\' 2"  (1.575 m), weight 123.6 kg, SpO2 98 %.   Intake/Output Summary (Last 24 hours) at 04/24/2021 1100 Last data filed at 04/24/2021 0856 Gross per 24 hour  Intake 60 ml  Output --  Net 60 ml    Exam  Awake Alert, No new F.N  deficits, Normal affect [place].AT,PERRAL Supple Neck,No JVD, No cervical lymphadenopathy appriciated.  Symmetrical Chest wall movement, Good air movement bilaterally, CTAB RRR,No Gallops,Rubs or new Murmurs, No Parasternal Heave +ve B.Sounds, Abd Soft, Non tender, No organomegaly appriciated, No rebound -guarding or rigidity. No Cyanosis, Clubbing or edema, No new Rash or bruise   Data Review   CBC w Diff:  Lab Results  Component Value Date   WBC 9.8 04/23/2021   HGB 8.8 (L) 04/23/2021   HCT 28.5 (L) 04/23/2021   PLT 259 04/23/2021   LYMPHOPCT 21 02/27/2021   BANDSPCT 13 02/05/2021   MONOPCT 10 02/27/2021   EOSPCT 6 02/27/2021   BASOPCT 1 02/27/2021    CMP:  Lab Results  Component Value Date   NA 136 04/23/2021   K 3.8 04/23/2021   CL 97 (L) 04/23/2021   CO2 30 04/23/2021   BUN 16 04/23/2021   CREATININE 4.10 (H) 04/23/2021   CREATININE 6.57 (H) 03/05/2019   PROT 6.0 (L) 02/27/2021   ALBUMIN 2.8 (L) 02/27/2021   BILITOT 0.3 02/27/2021   ALKPHOS 63 02/27/2021   AST 12 (L) 02/27/2021   ALT 12 02/27/2021  .   Total Time in preparing paper work, data evaluation and todays exam - 69 minutes  Lala Lund M.D on 04/24/2021 at 11:00 AM  [facility]

## 2021-04-24 NOTE — Progress Notes (Signed)
Cardiology Progress Note  Patient ID: Shawna [name] MRN: [id number] DOB: Dec 14, 1940 Date of Encounter: 04/24/2021  Primary Cardiologist: None  Subjective   Chief Complaint: None.   HPI: Admitted with SVT and likely atrial fibrillation last night.  She is converted back to sinus rhythm.  Cardiology was consulted for elevated troponin.  No chest pain symptoms.  She takes nitroglycerin infrequently.  ROS:  All other ROS reviewed and negative. Pertinent positives noted in the HPI.     Inpatient Medications  Scheduled Meds:  aspirin EC  81 mg Oral Daily   heparin  5,000 Units Subcutaneous Q8H   isosorbide mononitrate  60 mg Oral Daily   levothyroxine  25 mcg Oral Daily   metoprolol tartrate  50 mg Oral BID   omeprazole  20 mg Oral Daily   sevelamer carbonate  1,600 mg Oral TID WC   simvastatin  20 mg Oral Daily   Continuous Infusions:  PRN Meds: ALPRAZolam, nitroGLYCERIN, ondansetron (ZOFRAN) IV   Vital Signs   Vitals:   04/23/21 2156 04/23/21 2352 04/24/21 0400 04/24/21 0758  BP: (!) 142/80 113/64 (!) 138/54 (!) 144/50  Pulse: (!) 130 (!) 128 62 62  Resp: 20 18 16 17   Temp: 97.6 F (36.4 C) 97.8 F (36.6 C) 97.8 F (36.6 C) 98.4 F (36.9 C)  TempSrc: Oral Oral Oral Oral  SpO2: 100% 100% 98% 98%  Weight: 123.6 kg     Height: 5\' 2"  (1.575 m)       Intake/Output Summary (Last 24 hours) at 04/24/2021 1044 Last data filed at 04/24/2021 0856 Gross per 24 hour  Intake 60 ml  Output --  Net 60 ml   Last 3 Weights 04/23/2021 04/23/2021 04/10/2021  Weight (lbs) 272 lb 7.8 oz 124 lb 1.9 oz 124 lb 1.9 oz  Weight (kg) 123.6 kg 56.3 kg 56.3 kg      Telemetry  Overnight telemetry shows sinus rhythm in the 60s, combination of atrial tachycardia and possible atrial fibrillation overnight, which I personally reviewed.    Physical Exam   Vitals:   04/23/21 2156 04/23/21 2352 04/24/21 0400 04/24/21 0758  BP: (!) 142/80 113/64 (!) 138/54 (!) 144/50  Pulse: (!) 130 (!) 128  62 62  Resp: 20 18 16 17   Temp: 97.6 F (36.4 C) 97.8 F (36.6 C) 97.8 F (36.6 C) 98.4 F (36.9 C)  TempSrc: Oral Oral Oral Oral  SpO2: 100% 100% 98% 98%  Weight: 123.6 kg     Height: 5\' 2"  (1.575 m)       Intake/Output Summary (Last 24 hours) at 04/24/2021 1044 Last data filed at 04/24/2021 0856 Gross per 24 hour  Intake 60 ml  Output --  Net 60 ml    Last 3 Weights 04/23/2021 04/23/2021 04/10/2021  Weight (lbs) 272 lb 7.8 oz 124 lb 1.9 oz 124 lb 1.9 oz  Weight (kg) 123.6 kg 56.3 kg 56.3 kg    Body mass index is 49.84 kg/m.   General: Well nourished, well developed, in no acute distress Head: Atraumatic, normal size  Eyes: PEERLA, EOMI  Neck: Supple, no JVD Endocrine: No thryomegaly Cardiac: Normal S1, S2; RRR; no murmurs, rubs, or gallops Lungs: Clear to auscultation bilaterally, no wheezing, rhonchi or rales  Abd: Soft, nontender, no hepatomegaly  Ext: No edema, pulses 2+ Musculoskeletal: No deformities, BUE and BLE strength normal and equal Skin: Warm and dry, no rashes   Neuro: Alert and oriented to person, place, time, and situation, CNII-XII grossly intact, no  focal deficits  Psych: Normal mood and affect   Labs  High Sensitivity Troponin:   Recent Labs  Lab 04/09/21 1212 04/09/21 1335 04/23/21 1445 04/23/21 1634 04/24/21 0259  TROPONINIHS 156* 167* 483* 458* 255*     Cardiac EnzymesNo results for input(s): TROPONINI in the last 168 hours. No results for input(s): TROPIPOC in the last 168 hours.  Chemistry Recent Labs  Lab 04/23/21 1445  NA 136  K 3.8  CL 97*  CO2 30  GLUCOSE 115*  BUN 16  CREATININE 4.10*  CALCIUM 8.6*  GFRNONAA 10*  ANIONGAP 9    Hematology Recent Labs  Lab 04/23/21 1445  WBC 9.8  RBC 2.50*  HGB 8.8*  HCT 28.5*  MCV 114.0*  MCH 35.2*  MCHC 30.9  RDW 24.5*  PLT 259   BNPNo results for input(s): BNP, PROBNP in the last 168 hours.  DDimer No results for input(s): DDIMER in the last 168 hours.   Radiology  DG  Chest Port 1 View  Result Date: 04/23/2021 CLINICAL DATA:  Chest pain during dialysis EXAM: PORTABLE CHEST 1 VIEW COMPARISON:  04/09/2021 FINDINGS: Cardiac enlargement prior CABG. Negative for heart failure. Negative for edema or effusion. Mild bibasilar atelectasis. IMPRESSION: Mild bibasilar atelectasis.  Negative for edema. Electronically Signed   By: Franchot Gallo M.D.   On: 04/23/2021 16:14    Cardiac Studies  TTE 03/15/2021  1. Left ventricular ejection fraction, by estimation, is 50%. The left  ventricle has low normal function. The left ventricle has no regional wall  motion abnormalities. There is mild left ventricular hypertrophy. Left  ventricular diastolic parameters are   consistent with Grade II diastolic dysfunction (pseudonormalization).  Elevated left atrial pressure.   2. Right ventricular systolic function is normal. The right ventricular  size is normal. There is moderately elevated pulmonary artery systolic  pressure.   3. Left atrial size was severely dilated.   4. Right atrial size was mildly dilated.   5. The mitral valve is abnormal. Moderate mitral valve regurgitation.  Mild mitral stenosis. Moderate mitral annular calcification.   6. The tricuspid valve is abnormal. Tricuspid valve regurgitation is  moderate.   7. The aortic valve is tricuspid. There is moderate calcification of the  aortic valve. There is moderate thickening of the aortic valve. Aortic  valve regurgitation is not visualized. No aortic stenosis is present.   8. The inferior vena cava is normal in size with greater than 50%  respiratory variability, suggesting right atrial pressure of 3 mmHg.   [place] 07/13/2018 1. Severe 3 vessel occlusive CAD    - 60% distal left main    - 90% mid LAD    - 100% first diagonal at prior stent site    - 99% proximal LCx at prior stent site    - 80% ramus intermediate.    - 100% ostial RCA at prior stent site    - 75% proximal PDA 2. Patent LIMA to the LAD 3.  Patent SVG to ramus intermediate 4. Patent SVG to OM 5. Patent SVG to the distal RCA. 6. Good LV function 7. Mildly elevated LVEDP  Patient Profile  Shawna [name] is a 80 y.o. female with CAD status post CABG (patent LIMA to LAD, vein graft-RI, vein graft-OM, vein graft distal RCA), paroxysmal atrial fibrillation not on anticoagulation, diastolic heart failure, carotid artery disease, ESRD who was admitted on 04/23/2021 with SVT and likely atrial fibrillation.  Cardiology was consulted for elevated troponin.  Assessment &  Plan   #Elevated Troponin #Demand Ischemia #CAD status post CABG #Patent Grafts #75%PDA lesion  -Troponins were minimally elevated.  They are flat for her.  She underwent cardiac cath in 2020 and has all patency of her vein grafts.  She does have some small vessel disease in the PDA.  I suspect this was just demand episode in the setting of tachycardia.  She endorses no chest pain episodes this admission.  She has had chest pain symptoms but they resolved quickly with nitroglycerin.  To me this can just be treated medically.  This is not an acute coronary syndrome. -Continue aspirin and statin therapy.  We will likely transition to Lipitor as an outpatient. -Continue Imdur 60 mg daily.  Increase metoprolol to tartrate to 50 mg twice daily.  Stable for discharge.  Echo last month shows EF is preserved.  #SVT #pAF #LBBB  -Admitted with recurrent SVT.  Appears to represent likely atrial tachycardia.  She also had some irregular rhythms overnight.  This could have been A. fib.  Would like to rechallenge her on amiodarone for rhythm control strategy.  We will start her on 200 mg daily.  She is not a candidate for anticoagulation due to GI bleeding in the past.  Would agree with this recommendation.  We will also increase her metoprolol to tartrate to 50 mg twice daily.  We will see how she does.  I will arrange hospital follow-up in [place] office in 2 to 4 weeks.  [facility] will sign off.   Medication Recommendations:  As above Other recommendations (labs, testing, etc):  none Follow up as an outpatient: We will arrange hospital follow-up in 2 to 4 weeks in the [place] office  For questions or updates, please contact [place] Please consult [email or web address] for contact info under   Time Spent with Patient: I have spent a total of 35 minutes with patient reviewing hospital notes, telemetry, EKGs, labs and examining the patient as well as establishing an assessment and plan that was discussed with the patient.  > 50% of time was spent in direct patient care.    Signed, Addison Naegeli. Audie Box, MD, [place]  04/24/2021 10:44 AM

## 2021-04-24 NOTE — Progress Notes (Signed)
Consulted to restart I.V. pulled out by patient per RN. Attempted restart with and without ultrasound with no success. Patients arm extremely cold.RN will notify M.D.

## 2021-04-24 NOTE — Discharge Instructions (Signed)
Follow with Primary MD Practice, [facility] and your Cardiologist in 7 days   Get CBC, CMP, 2 view Chest X ray -  checked next visit within 1 week by Primary MD    Activity: As tolerated with Full fall precautions use walker/cane & assistance as needed  Disposition Home   Diet: Renal - Low Carb, 1.5 Lit/day fluid restriction  Special Instructions: If you have smoked or chewed Tobacco  in the last 2 yrs please stop smoking, stop any regular Alcohol  and or any Recreational drug use.  On your next visit with your primary care physician please Get Medicines reviewed and adjusted.  Please request your Prim.MD to go over all Hospital Tests and Procedure/Radiological results at the follow up, please get all Hospital records sent to your Prim MD by signing hospital release before you go home.  If you experience worsening of your admission symptoms, develop shortness of breath, life threatening emergency, suicidal or homicidal thoughts you must seek medical attention immediately by calling 911 or calling your MD immediately  if symptoms less severe.  You Must read complete instructions/literature along with all the possible adverse reactions/side effects for all the Medicines you take and that have been prescribed to you. Take any new Medicines after you have completely understood and accpet all the possible adverse reactions/side effects.

## 2021-04-24 NOTE — Progress Notes (Signed)
Discharge education and medication education done and all questions were answered, patient verbalizes understanding. PIV and telemetry leads removed. Patient transported to main entrance via wheel chair by nurse tech.

## 2021-04-26 ENCOUNTER — Encounter (HOSPITAL_COMMUNITY): Payer: Self-pay | Admitting: Internal Medicine

## 2021-04-29 NOTE — Op Note (Signed)
Small Bowel [place] Capsule Study Procedure date: 04/22/2021  Referring Provider: [facility] PCP:  Dr. Donavan Foil, [facility]  Indication for procedure:   Patient is an 80 year old F American female with recurrent anemia with need for transfusion and/or iron infusion who was seen in the office last week after she had been hospitalized for hemoglobin of 6.9 g requiring blood transfusion.  She is suspected to be losing blood from her small bowel.  Over the last 2 years she has undergone multiple studies.  She had small bowel given capsule study and colonoscopy in August 2020.  She had esophagogastroduodenoscopy in March 2021 that removal of small duodenal polyp.  EGD by Dr. Abbey Chatters on 02/05/2021 did not reveal any source of bleeding. She is undergoing repeat small bowel study hoping to find the source of bleeding. Patient's history is also significant for colon carcinoma for which she had right hemicolectomy in 1990s.   Findings:   Patient was able to swallow given capsule without any difficulty. Study is complete as capsule reached colon in study period. Small flat polyp noted in second or third part of the duodenum without stigmata of bleeding best seen on image at 33 minutes. 2 polyps were seen in distal small bowel without stigmata.  1 polyp was small and the other polyp medium size.  These polyps can be seen on images at 02:56:34 and 02:57:04. No fresh or old blood noted in stomach or small bowel.   First Gastric image: 12 min and 54 sec First Duodenal image: 32 min and 37 sec First I ileocolonic anastomosis image: 3 hrs 30 min and 54 sec First Colon image: 3 hrs 30 min and 56 sec Gastric Passage time: 20 min Small Bowel Passage time: 2 hrs and 58 min  Summary & Recommendations:  No fresh or old blood noted in stomach or small bowel. Three polyps noted in small bowel.  One of these polyps is located in second or third part of duodenum without stigmata of bleed. Other  two polyps are located in the distal small bowel once again without stigmata of bleed. Pictures of these polyps can be viewed under media; date 04/29/2021. These findings have been reviewed with patient and her husband and would recommend push enteroscopy and colonoscopy with retrograde examination distal small bowel by Dr. [name].

## 2021-05-04 ENCOUNTER — Telehealth (INDEPENDENT_AMBULATORY_CARE_PROVIDER_SITE_OTHER): Payer: Self-pay | Admitting: Internal Medicine

## 2021-05-04 ENCOUNTER — Other Ambulatory Visit (INDEPENDENT_AMBULATORY_CARE_PROVIDER_SITE_OTHER): Payer: Self-pay | Admitting: Vascular Surgery

## 2021-05-05 ENCOUNTER — Other Ambulatory Visit (INDEPENDENT_AMBULATORY_CARE_PROVIDER_SITE_OTHER): Payer: Self-pay | Admitting: Nurse Practitioner

## 2021-05-05 DIAGNOSIS — N186 End stage renal disease: Secondary | ICD-10-CM

## 2021-05-06 ENCOUNTER — Other Ambulatory Visit: Payer: Self-pay

## 2021-05-06 ENCOUNTER — Ambulatory Visit (INDEPENDENT_AMBULATORY_CARE_PROVIDER_SITE_OTHER): Payer: Medicare HMO

## 2021-05-06 ENCOUNTER — Ambulatory Visit (INDEPENDENT_AMBULATORY_CARE_PROVIDER_SITE_OTHER): Payer: Medicare HMO | Admitting: Vascular Surgery

## 2021-05-06 ENCOUNTER — Encounter (INDEPENDENT_AMBULATORY_CARE_PROVIDER_SITE_OTHER): Payer: Self-pay | Admitting: Vascular Surgery

## 2021-05-06 VITALS — BP 149/68 | HR 60 | Resp 16 | Wt 120.0 lb

## 2021-05-06 DIAGNOSIS — I2510 Atherosclerotic heart disease of native coronary artery without angina pectoris: Secondary | ICD-10-CM

## 2021-05-06 DIAGNOSIS — N186 End stage renal disease: Secondary | ICD-10-CM

## 2021-05-06 DIAGNOSIS — I10 Essential (primary) hypertension: Secondary | ICD-10-CM

## 2021-05-06 DIAGNOSIS — T829XXD Unspecified complication of cardiac and vascular prosthetic device, implant and graft, subsequent encounter: Secondary | ICD-10-CM | POA: Diagnosis not present

## 2021-05-06 DIAGNOSIS — I4891 Unspecified atrial fibrillation: Secondary | ICD-10-CM

## 2021-05-06 DIAGNOSIS — Z992 Dependence on renal dialysis: Secondary | ICD-10-CM

## 2021-05-06 NOTE — Progress Notes (Signed)
MRN : [id number]  Shawna [name] is a 80 y.o. ([birth year]) female who presents with chief complaint of check access.  History of Present Illness:  The patient returns to the office for followup of their dialysis access.  The patient had recent intervention at [facility] on 09/29/2020.  The function of the access has been stable. The patient denies increased bleeding time or increased recirculation. Patient denies difficulty with cannulation. The patient denies hand pain or other symptoms consistent with steal phenomena.  No significant arm swelling.   The patient denies redness or swelling at the access site. The patient denies fever or chills at home or while on dialysis.   The patient denies amaurosis fugax or recent TIA symptoms. There are no recent neurological changes noted. The patient denies claudication symptoms or rest pain symptoms. The patient denies history of DVT, PE or superficial thrombophlebitis. The patient denies recent episodes of angina or shortness of breath.    Today the patient has a flow volume of 1643 (previous flow volume of 1988).  There are no areas of significant stenosis.  Current Meds  Medication Sig   ALPRAZolam (XANAX XR) 0.5 MG 24 hr tablet Take by mouth.   ALPRAZolam (XANAX) 0.5 MG tablet Take 0.5 mg by mouth daily as needed.   amiodarone (PACERONE) 200 MG tablet Take 1 tablet (200 mg total) by mouth daily.   aspirin EC 81 MG tablet Take 81 mg by mouth daily.    diclofenac Sodium (VOLTAREN) 1 % GEL Apply 2 g topically 4 (four) times daily as needed. (Patient taking differently: Apply 2 g topically 4 (four) times daily as needed (pain).)   fluticasone (FLONASE) 50 MCG/ACT nasal spray Place 1 spray into both nostrils at bedtime as needed for allergies.    hydrOXYzine (VISTARIL) 100 MG capsule Take 100 mg by mouth daily as needed for anxiety or itching.   isosorbide mononitrate (IMDUR) 60 MG 24 hr tablet Take 1 tablet (60 mg total) by mouth in the morning and at  bedtime.   levothyroxine (SYNTHROID) 25 MCG tablet Take 1 tablet (25 mcg total) by mouth daily.   lidocaine-prilocaine (EMLA) cream Apply 1 application topically every Monday, Wednesday, and Friday. Prior to dialysis   loperamide (IMODIUM A-D) 2 MG tablet Take 2 tablets (4 mg total) by mouth daily as needed for diarrhea or loose stools.   metoprolol tartrate (LOPRESSOR) 50 MG tablet Take 1 tablet (50 mg total) by mouth 2 (two) times daily.   midodrine (PROAMATINE) 5 MG tablet Take Monday, Wednesday, Friday morning BEFORE dialysis   multivitamin (RENA-VIT) TABS tablet Take 1 tablet by mouth daily.   nitroGLYCERIN (NITROSTAT) 0.4 MG SL tablet Place 1 tablet (0.4 mg total) under the tongue every 5 (five) minutes x 3 doses as needed for chest pain (if no relief after 2nd dose, proceed to the ED for an evaluation or call 911).   omeprazole (PRILOSEC) 20 MG capsule Take 1 capsule (20 mg total) by mouth daily.   sevelamer carbonate (RENVELA) 800 MG tablet Take 2 tablets (1,600 mg total) by mouth 3 (three) times daily with meals.   simvastatin (ZOCOR) 20 MG tablet Take 20 mg by mouth daily.    Past Medical History:  Diagnosis Date   Acute on chronic respiratory failure with hypoxia ([place]) 10/10/2016   Anxiety    Arthritis    AVM (arteriovenous malformation) of colon    CAD (coronary artery disease)    a. s/p CABG in 2013 b. DES  to D1 in 10/2016. c. cath in 07/2018 showing patent grafts with occlusion of D1 at prior stent site and progression of PDA disease --> medical management recommended   Carotid artery disease ([place])    a. 53-66% LICA, 10/4032    Chronic anemia    Chronic bronchitis (HCC)    Chronic diastolic CHF (congestive heart failure) ([place])    a. 02/2012 Echo EF 60-65%, nl wall motion, Gr 1 DD, mod MR   Colon cancer ([place]) 1992   Esophageal stricture    ESRD on hemodialysis ([place])    ESRD due to HTN, started dialysis 2011 and gets HD at [facility] with Dr Hinda Lenis on MWF schedule.  Access  is LUA AVF as of Sept 2014.    GERD (gastroesophageal reflux disease)    High cholesterol 12/2011   History of blood transfusion 07/2011; 12/2011; 01/2012 X 2; 04/2012   History of gout    History of lower GI bleeding    Hypertension    Iron deficiency anemia    Jugular vein occlusion, right (HCC)    Mitral regurgitation    a. Moderate by echo, 02/2012   Myocardial infarction [facility])    NSVT (nonsustained ventricular tachycardia)    Ovarian cancer ([place]) 1992   PAF (paroxysmal atrial fibrillation) ([place])    Pneumonia ~ 2009   PUD (peptic ulcer disease)    TIA (transient ischemic attack)     Past Surgical History:  Procedure Laterality Date   A/V SHUNTOGRAM Left 03/19/2019   Procedure: A/V SHUNTOGRAM;  Surgeon: Katha Cabal, MD;  Location: [place] CV LAB;  Service: Cardiovascular;  Laterality: Left;   ABDOMINAL HYSTERECTOMY  1992   APPENDECTOMY  06/1990   AV FISTULA PLACEMENT  07/2009   left upper arm   AV FISTULA PLACEMENT Right 09/06/2016   Procedure: RIGHT FOREARM ARTERIOVENOUS (AV) GRAFT;  Surgeon: Elam Dutch, MD;  Location: [place];  Service: Vascular;  Laterality: Right;   AV FISTULA PLACEMENT N/A 02/24/2017   Procedure: INSERTION OF ARTERIOVENOUS (AV) GORE-TEX GRAFT ARM (BRACHIAL AXILLARY);  Surgeon: Katha Cabal, MD;  Location: [facility] ORS;  Service: Vascular;  Laterality: N/A;   [place] Right 09/06/2016   Procedure: REMOVAL OF Right Arm ARTERIOVENOUS GORETEX GRAFT and Vein Patch angioplasty of brachial artery;  Surgeon: Angelia Mould, MD;  Location: [place];  Service: Vascular;  Laterality: Right;   BIOPSY  09/26/2019   Procedure: BIOPSY;  Surgeon: Rogene Houston, MD;  [facility];  Service: Endoscopy;;   COLON RESECTION  1992   COLON SURGERY     COLONOSCOPY N/A 03/09/2019   Procedure: COLONOSCOPY;  Surgeon: Rogene Houston, MD;  [facility];  Service: Endoscopy;  Laterality: N/A;   CORONARY ANGIOPLASTY WITH STENT PLACEMENT   12/15/11   "2"   CORONARY ANGIOPLASTY WITH STENT PLACEMENT  y/2013   "1; makes total of 3" (05/02/2012)   CORONARY ARTERY BYPASS GRAFT  06/13/2012   Procedure: CORONARY ARTERY BYPASS GRAFTING (CABG);  Surgeon: Grace Isaac, MD;  Location: [place];  Service: Open Heart Surgery;  Laterality: N/A;  cabg x four;  using left internal mammary artery, and left leg greater saphenous vein harvested endoscopically   CORONARY STENT INTERVENTION N/A 10/13/2016   Procedure: Coronary Stent Intervention;  Surgeon: Troy Sine, MD;  Location: [place] CV LAB;  Service: Cardiovascular;  Laterality: N/A;   DIALYSIS/PERMA CATHETER REMOVAL N/A 04/18/2017   Procedure: DIALYSIS/PERMA CATHETER REMOVAL;  Surgeon: Hortencia Pilar  G, MD;  Location: [place] CV [facility];  Service: Cardiovascular;  Laterality: N/A;   DILATION AND CURETTAGE OF UTERUS     ESOPHAGOGASTRODUODENOSCOPY  01/20/2012   Procedure: ESOPHAGOGASTRODUODENOSCOPY (EGD);  Surgeon: Ladene Artist, MD,FACG;  Location: [facility] ENDOSCOPY;  Service: Endoscopy;  Laterality: N/A;   ESOPHAGOGASTRODUODENOSCOPY N/A 03/26/2013   Procedure: ESOPHAGOGASTRODUODENOSCOPY (EGD);  Surgeon: Irene Shipper, MD;  Location: [facility] ENDOSCOPY;  Service: Endoscopy;  Laterality: N/A;   ESOPHAGOGASTRODUODENOSCOPY N/A 04/30/2015   Procedure: ESOPHAGOGASTRODUODENOSCOPY (EGD);  Surgeon: Rogene Houston, MD;  [facility];  Service: Endoscopy;  Laterality: N/A;  1pm - moved to 10/20 @ 1:10   ESOPHAGOGASTRODUODENOSCOPY N/A 07/29/2016   Procedure: ESOPHAGOGASTRODUODENOSCOPY (EGD);  Surgeon: Manus Gunning, MD;  Location: [place];  Service: Gastroenterology;  Laterality: N/A;  enteroscopy   ESOPHAGOGASTRODUODENOSCOPY N/A 09/26/2019   Procedure: ESOPHAGOGASTRODUODENOSCOPY (EGD);  Surgeon: Rogene Houston, MD;  [facility];  Service: Endoscopy;  Laterality: N/A;  1250   ESOPHAGOGASTRODUODENOSCOPY (EGD) WITH PROPOFOL N/A 02/05/2021   Procedure:  ESOPHAGOGASTRODUODENOSCOPY (EGD) WITH PROPOFOL;  Surgeon: Eloise Harman, DO;  [facility];  Service: Endoscopy;  Laterality: N/A;   GIVENS CAPSULE STUDY N/A 03/07/2019   Procedure: GIVENS CAPSULE STUDY;  Surgeon: Rogene Houston, MD;  [facility];  Service: Endoscopy;  Laterality: N/A;  7:30   GIVENS CAPSULE STUDY N/A 04/22/2021   Procedure: GIVENS CAPSULE STUDY;  Surgeon: Rogene Houston, MD;  [facility];  Service: Endoscopy;  Laterality: N/A;  7:30   INSERTION OF DIALYSIS CATHETER N/A 10/05/2020   Procedure: ABORTED TUNNELED DIALYSIS CATHETER PLACEMENT RIGHT INTERNAL JUGULAR VEIN ;  Surgeon: Virl Cagey, MD;  [facility];  Service: General;  Laterality: N/A;   INTRAOPERATIVE TRANSESOPHAGEAL ECHOCARDIOGRAM  06/13/2012   Procedure: INTRAOPERATIVE TRANSESOPHAGEAL ECHOCARDIOGRAM;  Surgeon: Grace Isaac, MD;  Location: [place];  Service: Open Heart Surgery;  Laterality: N/A;   IR DIALY SHUNT INTRO NEEDLE/INTRACATH INITIAL W/IMG LEFT Left 10/06/2020   IR FLUORO GUIDE CV LINE RIGHT  06/17/2020   IR GENERIC HISTORICAL  07/26/2016   IR FLUORO GUIDE CV LINE RIGHT 07/26/2016 Greggory Keen, MD [facility]   IR GENERIC HISTORICAL  07/26/2016   IR US GUIDE VASC ACCESS RIGHT 07/26/2016 Greggory Keen, MD [facility]   IR GENERIC HISTORICAL  08/02/2016   IR US GUIDE VASC ACCESS RIGHT 08/02/2016 Greggory Keen, MD [facility]   IR GENERIC HISTORICAL  08/02/2016   IR FLUORO GUIDE CV LINE RIGHT 08/02/2016 Greggory Keen, MD [facility]   IR RADIOLOGY PERIPHERAL GUIDED IV START  03/28/2017   IR REMOVAL TUN CV CATH W/O FL  08/11/2020   IR THROMBECTOMY AV FISTULA W/THROMBOLYSIS INC/SHUNT/IMG LEFT Left 06/17/2020   IR US GUIDE VASC ACCESS LEFT  06/17/2020   IR US GUIDE VASC ACCESS RIGHT  03/28/2017   IR US GUIDE VASC ACCESS RIGHT  06/17/2020   LEFT HEART CATH AND CORONARY ANGIOGRAPHY N/A 09/20/2016   Procedure: Left Heart Cath and Coronary Angiography;  Surgeon: [name], MD;  Location: [place] CV LAB;  Service: Cardiovascular;  Laterality: N/A;   LEFT HEART CATH AND CORS/GRAFTS ANGIOGRAPHY N/A 10/13/2016   Procedure: Left Heart Cath and Cors/Grafts Angiography;  Surgeon: Troy Sine, MD;  Location: [place] [facility];  Service: Cardiovascular;  Laterality: N/A;   LEFT HEART CATH AND CORS/GRAFTS ANGIOGRAPHY N/A 07/13/2018   Procedure: LEFT HEART CATH AND CORS/GRAFTS ANGIOGRAPHY;  Surgeon: Martinique, Peter M, MD;  Location: [place] [facility];  Service: Cardiovascular;  Laterality: N/A;   LEFT HEART CATHETERIZATION WITH CORONARY ANGIOGRAM N/A 12/15/2011   Procedure: LEFT HEART CATHETERIZATION WITH CORONARY ANGIOGRAM;  Surgeon: Burnell Blanks, MD;  Location: [facility] CATH LAB;  Service: Cardiovascular;  Laterality: N/A;   LEFT HEART CATHETERIZATION WITH CORONARY ANGIOGRAM N/A 01/10/2012   Procedure: LEFT HEART CATHETERIZATION WITH CORONARY ANGIOGRAM;  Surgeon: Peter M Martinique, MD;  Location: [facility] CATH LAB;  Service: Cardiovascular;  Laterality: N/A;   LEFT HEART CATHETERIZATION WITH CORONARY ANGIOGRAM N/A 06/08/2012   Procedure: LEFT HEART CATHETERIZATION WITH CORONARY ANGIOGRAM;  Surgeon: Burnell Blanks, MD;  Location: [facility] CATH LAB;  Service: Cardiovascular;  Laterality: N/A;   LEFT HEART CATHETERIZATION WITH CORONARY/GRAFT ANGIOGRAM N/A 12/10/2013   Procedure: LEFT HEART CATHETERIZATION WITH Beatrix Fetters;  Surgeon: [name], MD;  Location: [facility] CATH LAB;  Service: Cardiovascular;  Laterality: N/A;   OVARY SURGERY     ovarian cancer   POLYPECTOMY  03/09/2019   Procedure: POLYPECTOMY;  Surgeon: Rogene Houston, MD;  [facility];  Service: Endoscopy;;  cecal    POLYPECTOMY N/A 09/26/2019   Procedure: DUODENAL POLYPECTOMY;  Surgeon: Rogene Houston, MD;  [facility];  Service: Endoscopy;  Laterality: N/A;   REVISION OF ARTERIOVENOUS GORETEX GRAFT N/A 02/24/2017   Procedure: REVISION OF ARTERIOVENOUS GORETEX GRAFT  (RESECTION);  Surgeon: Katha Cabal, MD;  Location: [facility] OR[facility];  Service: Vascular;  Laterality: N/A;   REVISON OF ARTERIOVENOUS FISTULA Left 06/19/2020   Procedure: REVISION OF LEFT UPPER ARM AV GRAFT WITH INTERPOSITION JUMP GRAFT USING 6MM GORE LIMB;  Surgeon: Marty Heck, MD;  Location: [place];  Service: Vascular;  Laterality: Left;   SHUNTOGRAM N/A 10/15/2013   Procedure: Fistulogram;  Surgeon: Serafina Mitchell, MD;  Location: [facility] CATH LAB;  Service: Cardiovascular;  Laterality: N/A;   THROMBECTOMY / ARTERIOVENOUS GRAFT REVISION  2011   left upper arm   TUBAL LIGATION  1980's   UPPER EXTREMITY ANGIOGRAPHY Bilateral 12/06/2016   Procedure: Upper Extremity Angiography;  Surgeon: Katha Cabal, MD;  Location: [place] [facility] LAB;  Service: Cardiovascular;  Laterality: Bilateral;   UPPER EXTREMITY INTERVENTION Left 06/06/2017   Procedure: UPPER EXTREMITY INTERVENTION;  Surgeon: Katha Cabal, MD;  Location: [place] CV LAB;  Service: Cardiovascular;  Laterality: Left;    Social History Social History   Tobacco Use   Smoking status: Never   Smokeless tobacco: Never  Vaping Use   Vaping Use: Never used  Substance Use Topics   Alcohol use: No    Alcohol/week: 0.0 standard drinks   Drug use: No    Family History Family History  Problem Relation Age of Onset   Heart disease Mother        Heart Disease before age 37   Hyperlipidemia Mother    Hypertension Mother    Diabetes Mother    Heart attack Mother    Heart disease Father        Heart Disease before age 69   Hyperlipidemia Father    Hypertension Father    Diabetes Father    Diabetes Sister    Hypertension Sister    Diabetes Brother    Hyperlipidemia Brother    Heart attack Brother    Hypertension Sister    Heart attack Brother    Colon cancer Child 43   Other Other        noncontributory for early CAD   Esophageal cancer Neg  Hx    Liver disease Neg Hx    Kidney disease Neg Hx    Colon  polyps Neg Hx     Allergies  Allergen Reactions   Amlodipine Swelling   Aspirin Other (See Comments)    High Doses Mess up her stomach; "makes my bowels have blood in them". Takes 81 mg EC Aspirin    Nitrofurantoin Hives   Penicillins Other (See Comments)    SYNCOPE? , "makes me real weak when I take it; like I'll pass out"  Has patient had a PCN reaction causing immediate rash, facial/tongue/throat swelling, SOB or lightheadedness with hypotension: Yes Has patient had a PCN reaction causing severe rash involving mucus membranes or skin necrosis: no Has patient had a PCN reaction that required hospitalization no Has patient had a PCN reaction occurring within the last 10 years: no If all of the above Other reaction(s): Other (See Comments) SYNCOPE? , "makes me real weak when I take it; like I'll pass out"  Has patient had a PCN reaction causing immediate rash, facial/tongue/throat swelling, SOB or lightheadedness with hypotension: Yes Has patient had a PCN reaction causing severe rash involving mucus membranes or skin necrosis: no Has patient had a PCN reaction that required hospitalization no Has patient had a PCN reaction occurring within the last 10 years: no If all of the above I FELT LIKE I WAS GOING TO PASS OUT   Bactrim [Sulfamethoxazole-Trimethoprim] Rash   Contrast Media [Iodinated Diagnostic Agents] Itching   Iron Itching and Other (See Comments)    "they gave me iron in dialysis; had to give me Benadryl cause I had to have the iron" (05/02/2012)   Tylenol [Acetaminophen] Itching and Other (See Comments)    Makes her feet on fire per pt   Gabapentin Other (See Comments)    Unknown reaction   Iron Sucrose    No Known Allergies    Ranexa [Ranolazine] Other (See Comments)    Myoclonus-hospitalized    Sucroferric Oxyhydroxide    Clopidogrel Rash   Dexilant [Dexlansoprazole] Other (See Comments)    Upset stomach   Hydralazine Itching    Has tolerated while  inpatient Has tolerated while inpatient   Levaquin [Levofloxacin In D5w] Rash   Levofloxacin Rash   Morphine Itching   Morphine And Related Itching and Other (See Comments)    Itching in feet   Pantoprazole Rash   Plavix [Clopidogrel Bisulfate] Rash   Protonix [Pantoprazole Sodium] Rash   Venofer [Ferric Oxide] Itching and Other (See Comments)    Patient reports using Benadryl prior to doses as [place] (Negative unless checked)  Constitutional: [] Weight loss  [] Fever  [] Chills Cardiac: [] Chest pain   [] Chest pressure   [] Palpitations   [] Shortness of breath when laying flat   [] Shortness of breath with exertion. Vascular:  [] Pain in legs with walking   [] Pain in legs at rest  [] History of DVT   [] Phlebitis   [] Swelling in legs   [] Varicose veins   [] Non-healing ulcers Pulmonary:   [] Uses home oxygen   [] Productive cough   [] Hemoptysis   [] Wheeze  [] COPD   [] Asthma Neurologic:  [] Dizziness   [] Seizures   [] History of stroke   [] History of TIA  [] Aphasia   [] Vissual changes   [] Weakness or numbness in arm   [] Weakness or numbness in leg Musculoskeletal:   [] Joint swelling   [] Joint pain   [] Low back pain Hematologic:  [] Easy bruising  [] Easy  bleeding   [] Hypercoagulable state   [] Anemic Gastrointestinal:  [] Diarrhea   [] Vomiting  [] Gastroesophageal reflux/heartburn   [] Difficulty swallowing. Genitourinary:  [x] Chronic kidney disease   [] Difficult urination  [] Frequent urination   [] Blood in urine Skin:  [] Rashes   [] Ulcers  Psychological:  [] History of anxiety   []  History of major depression.  Physical Examination  Vitals:   05/06/21 1459  BP: (!) 149/68  Pulse: 60  Resp: 16  Weight: 120 lb (54.4 kg)   Body mass index is 21.95 kg/m. Gen: WD/WN, NAD Head: [place]/AT, No temporalis wasting.  Ear/Nose/Throat: Hearing grossly intact, nares w/o erythema or drainage Eyes: PER, EOMI, sclera nonicteric.  Neck: Supple, no gross masses or lesions.  No JVD.   Pulmonary:  Good air movement, no audible wheezing, no use of accessory muscles.  Cardiac: RRR, precordium non-hyperdynamic. Vascular:   left arm AV access good thrill good bruit Vessel Right Left  Radial Palpable Palpable  Brachial Palpable Palpable  Gastrointestinal: soft, non-distended. No guarding/no peritoneal signs.  Musculoskeletal: M/S 5/5 throughout.  No deformity.  Neurologic: CN 2-12 intact. Pain and light touch intact in extremities.  Symmetrical.  Speech is fluent. Motor exam as listed above. Psychiatric: Judgment intact, Mood & affect appropriate for pt's clinical situation. Dermatologic: No rashes or ulcers noted.  No changes consistent with cellulitis.   CBC Lab Results  Component Value Date   WBC 6.7 04/24/2021   HGB 12.2 04/24/2021   HCT 37.9 04/24/2021   MCV 111.1 (H) 04/24/2021   PLT 199 04/24/2021    BMET    Component Value Date/Time   NA 135 04/24/2021 0259   K 4.3 04/24/2021 0259   CL 98 04/24/2021 0259   CO2 23 04/24/2021 0259   GLUCOSE 98 04/24/2021 0259   BUN 22 04/24/2021 0259   CREATININE 5.10 (H) 04/24/2021 0259   CREATININE 6.57 (H) 03/05/2019 1159   CALCIUM 9.2 04/24/2021 0259   CALCIUM 8.1 (L) 05/29/2013 1814   GFRNONAA 8 (L) 04/24/2021 0259   GFRNONAA 6 (L) 03/05/2019 1159   GFRAA 9 (L) 02/12/2020 1712   GFRAA 6 (L) 03/05/2019 1159   Estimated Creatinine Clearance: 6.8 mL/min (A) (by C-G formula based on SCr of 5.1 mg/dL (H)).  COAG Lab Results  Component Value Date   INR 1.0 02/03/2021   INR 1.1 06/12/2020   INR 1.1 03/08/2019    Radiology US Venous Img Lower Unilateral Left  Result Date: 04/09/2021 CLINICAL DATA:  Shortness of breath. Left lower extremity pain and swelling. EXAM: LEFT LOWER EXTREMITY VENOUS DOPPLER ULTRASOUND TECHNIQUE: Gray-scale sonography with graded compression, as well as color Doppler and duplex ultrasound were performed to evaluate the lower extremity deep venous systems from the level of the common  femoral vein and including the common femoral, femoral, profunda femoral, popliteal and calf veins including the posterior tibial, peroneal and gastrocnemius veins when visible. The superficial great saphenous vein was also interrogated. Spectral Doppler was utilized to evaluate flow at rest and with distal augmentation maneuvers in the common femoral, femoral and popliteal veins. COMPARISON:  None. FINDINGS: Contralateral Common Femoral Vein: Respiratory phasicity is normal and symmetric with the symptomatic side. No evidence of thrombus. Normal compressibility. Common Femoral Vein: No evidence of thrombus. Normal compressibility, respiratory phasicity and response to augmentation. Saphenofemoral Junction: No evidence of thrombus. Normal compressibility and flow on color Doppler imaging. Profunda Femoral Vein: No evidence of thrombus. Normal compressibility and flow on color Doppler imaging. Femoral Vein: No evidence of thrombus. Normal compressibility, respiratory  phasicity and response to augmentation. Popliteal Vein: No evidence of thrombus. Normal compressibility, respiratory phasicity and response to augmentation. Calf Veins: Visualized left deep calf veins are patent without thrombus. Superficial Great Saphenous Vein: No evidence of thrombus. Normal compressibility. Other Findings: Small fluid collection in the left popliteal fossa that measures 1.8 x 0.6 x 1.6 cm. Normal compressibility in the visualized left short saphenous vein. Subcutaneous edema in the left ankle and calf. IMPRESSION: 1.  Negative for deep venous thrombosis in left lower extremity. 2. Small left knee Baker's cyst. Electronically Signed   By: Markus Daft M.D.   On: 04/09/2021 13:57   DG Chest Port 1 View  Result Date: 04/23/2021 CLINICAL DATA:  Chest pain during dialysis EXAM: PORTABLE CHEST 1 VIEW COMPARISON:  04/09/2021 FINDINGS: Cardiac enlargement prior CABG. Negative for heart failure. Negative for edema or effusion. Mild  bibasilar atelectasis. IMPRESSION: Mild bibasilar atelectasis.  Negative for edema. Electronically Signed   By: Franchot Gallo M.D.   On: 04/23/2021 16:14   DG Chest Port 1 View  Result Date: 04/09/2021 CLINICAL DATA:  sob EXAM: PORTABLE CHEST 1 VIEW COMPARISON:  03/15/2021. FINDINGS: Similar left greater than right basilar opacities. Probable small bilateral pleural effusions. No visible pneumothorax on this semi erect radiograph. Similar mild enlargement the cardiac silhouette. Median sternotomy and CABG. Similar pulmonary vascular congestion. IMPRESSION: 1. Similar probable small bilateral pleural effusions with overlying left greater than right basilar atelectasis and/or consolidation. 2. Similar cardiomegaly and pulmonary vascular congestion. Electronically Signed   By: Margaretha Sheffield M.D.   On: 04/09/2021 12:31     Assessment/Plan 1. ESRD (end stage renal disease) on dialysis [facility]) Recommend:  The patient is doing well and currently has adequate dialysis access. The patient's dialysis center is not reporting any access issues. Flow pattern is stable when compared to the prior ultrasound.  The patient should have a duplex ultrasound of the dialysis access in 6 months. The patient will follow-up with me in the office after each ultrasound    - VAS [place] (AVF, AVG); Future  2. Complication from renal dialysis device, subsequent encounter Recommend:  The patient is doing well and currently has adequate dialysis access. The patient's dialysis center is not reporting any access issues. Flow pattern is stable when compared to the prior ultrasound.  The patient should have a duplex ultrasound of the dialysis access in 6 months. The patient will follow-up with me in the office after each ultrasound     3. Essential hypertension Continue antihypertensive medications as already ordered, these medications have been reviewed and there are no changes at this time.    4. Atherosclerosis of native coronary artery of native heart without angina pectoris Continue cardiac and antihypertensive medications as already ordered and reviewed, no changes at this time.  Continue statin as ordered and reviewed, no changes at this time  Nitrates PRN for chest pain   5. Atrial fibrillation, unspecified type ([place]) Continue antiarrhythmia medications as already ordered, these medications have been reviewed and there are no changes at this time.  Continue anticoagulation as ordered by Cardiology Service     Hortencia Pilar, MD  05/06/2021 3:07 PM

## 2021-05-07 ENCOUNTER — Encounter (INDEPENDENT_AMBULATORY_CARE_PROVIDER_SITE_OTHER): Payer: Self-pay | Admitting: Vascular Surgery

## 2021-05-07 DIAGNOSIS — I4891 Unspecified atrial fibrillation: Secondary | ICD-10-CM | POA: Insufficient documentation

## 2021-05-07 DIAGNOSIS — I482 Chronic atrial fibrillation, unspecified: Secondary | ICD-10-CM | POA: Insufficient documentation

## 2021-05-11 NOTE — Telephone Encounter (Signed)
error 

## 2021-05-12 NOTE — Progress Notes (Signed)
Cardiology Office Note    Date:  05/13/2021   ID:  Shawna [name], DOB 09/17/1940, MRN [id number]  PCP:  [facility]  Cardiologist: Carlyle Dolly, MD    Chief Complaint  Patient presents with   Hospitalization Follow-up     History of Present Illness:    Shawna [name] is a 80 y.o. female with past medical history of CAD (s/p CABG in 2013, DES to D1 in 10/2016, cath in 07/2018 showing patent grafts with occlusion of D1 at prior stent site and progression of PDA disease --> medical management recommended), HFpEF, HTN, HLD, paroxysmal atrial fibrillation (not on anticoagulation given history of GIB), NSVT, anemia and ESRD who presents to the office today for hospital follow-up.   She was most recently admitted to [facility] from 10/14 - 04/24/2021 for evaluation of palpitations and was found to have SVT along with possible recurrent atrial fibrillation. It was recommended that she be restarted on Amiodarone and she was started on 200 mg daily. Was not a candidate for anticoagulation due to prior GI bleeding. Lopressor was also titrated to 50 mg twice daily. Troponin values remained overall flat and was felt to be most consistent with demand ischemia in the setting of her arrhythmia. Further ischemic evaluation was not pursued.  In talking with the patient and her husband today, she reports having occasional nocturnal palpitations but no persistent or severe symptoms resembling what she had experienced at the time of her hospitalization. Denies any recent chest pain, dyspnea on exertion, orthopnea or PND. She does experience occasional lower extremity edema and this improves with HD. Says her sessions are often paused or stopped early due to hypotension.    Past Medical History:  Diagnosis Date   Acute on chronic respiratory failure with hypoxia ([place]) 10/10/2016   Anxiety    Arthritis    AVM (arteriovenous malformation) of colon    CAD (coronary artery disease)    a. s/p  CABG in 2013 b. DES to D1 in 10/2016. c. cath in 07/2018 showing patent grafts with occlusion of D1 at prior stent site and progression of PDA disease --> medical management recommended   Carotid artery disease ([place])    a. 37-16% LICA, 03/6788    Chronic anemia    Chronic bronchitis (HCC)    Chronic diastolic CHF (congestive heart failure) ([place])    a. 02/2012 Echo EF 60-65%, nl wall motion, Gr 1 DD, mod MR   Colon cancer ([place]) 1992   Esophageal stricture    ESRD on hemodialysis ([name])    ESRD due to HTN, started dialysis 2011 and gets HD at [facility] with Dr Hinda Lenis on MWF schedule.  Access is LUA AVF as of Sept 2014.    GERD (gastroesophageal reflux disease)    High cholesterol 12/2011   History of blood transfusion 07/2011; 12/2011; 01/2012 X 2; 04/2012   History of gout    History of lower GI bleeding    Hypertension    Iron deficiency anemia    Jugular vein occlusion, right (HCC)    Mitral regurgitation    a. Moderate by echo, 02/2012   Myocardial infarction [facility])    NSVT (nonsustained ventricular tachycardia)    Ovarian cancer ([place]) 1992   PAF (paroxysmal atrial fibrillation) ([place])    Pneumonia ~ 2009   PUD (peptic ulcer disease)    TIA (transient ischemic attack)     Past Surgical History:  Procedure Laterality Date   A/V SHUNTOGRAM Left 03/19/2019  Procedure: A/V SHUNTOGRAM;  Surgeon: Katha Cabal, MD;  Location: [place] CV LAB;  Service: Cardiovascular;  Laterality: Left;   ABDOMINAL HYSTERECTOMY  1992   APPENDECTOMY  06/1990   AV FISTULA PLACEMENT  07/2009   left upper arm   AV FISTULA PLACEMENT Right 09/06/2016   Procedure: RIGHT FOREARM ARTERIOVENOUS (AV) GRAFT;  Surgeon: Elam Dutch, MD;  Location: [place];  Service: Vascular;  Laterality: Right;   AV FISTULA PLACEMENT N/A 02/24/2017   Procedure: INSERTION OF ARTERIOVENOUS (AV) GORE-TEX GRAFT ARM (BRACHIAL AXILLARY);  Surgeon: Katha Cabal, MD;  Location: [facility] ORS;  Service: Vascular;  Laterality: N/A;    [place] Right 09/06/2016   Procedure: REMOVAL OF Right Arm ARTERIOVENOUS GORETEX GRAFT and Vein Patch angioplasty of brachial artery;  Surgeon: Angelia Mould, MD;  Location: [place];  Service: Vascular;  Laterality: Right;   BIOPSY  09/26/2019   Procedure: BIOPSY;  Surgeon: Rogene Houston, MD;  [facility];  Service: Endoscopy;;   COLON RESECTION  1992   COLON SURGERY     COLONOSCOPY N/A 03/09/2019   Procedure: COLONOSCOPY;  Surgeon: Rogene Houston, MD;  [facility];  Service: Endoscopy;  Laterality: N/A;   CORONARY ANGIOPLASTY WITH STENT PLACEMENT  12/15/11   "2"   CORONARY ANGIOPLASTY WITH STENT PLACEMENT  y/2013   "1; makes total of 3" (05/02/2012)   CORONARY ARTERY BYPASS GRAFT  06/13/2012   Procedure: CORONARY ARTERY BYPASS GRAFTING (CABG);  Surgeon: Grace Isaac, MD;  Location: [place];  Service: Open Heart Surgery;  Laterality: N/A;  cabg x four;  using left internal mammary artery, and left leg greater saphenous vein harvested endoscopically   CORONARY STENT INTERVENTION N/A 10/13/2016   Procedure: Coronary Stent Intervention;  Surgeon: Troy Sine, MD;  Location: [place] [facility] LAB;  Service: Cardiovascular;  Laterality: N/A;   DIALYSIS/PERMA CATHETER REMOVAL N/A 04/18/2017   Procedure: DIALYSIS/PERMA CATHETER REMOVAL;  Surgeon: Katha Cabal, MD;  Location: [place] [facility];  Service: Cardiovascular;  Laterality: N/A;   DILATION AND CURETTAGE OF UTERUS     ESOPHAGOGASTRODUODENOSCOPY  01/20/2012   Procedure: ESOPHAGOGASTRODUODENOSCOPY (EGD);  Surgeon: Ladene Artist, MD,FACG;  Location: [facility] ENDOSCOPY;  Service: Endoscopy;  Laterality: N/A;   ESOPHAGOGASTRODUODENOSCOPY N/A 03/26/2013   Procedure: ESOPHAGOGASTRODUODENOSCOPY (EGD);  Surgeon: Irene Shipper, MD;  Location: [facility] ENDOSCOPY;  Service: Endoscopy;  Laterality: N/A;   ESOPHAGOGASTRODUODENOSCOPY N/A 04/30/2015   Procedure: ESOPHAGOGASTRODUODENOSCOPY (EGD);  Surgeon: Rogene Houston, MD;   [facility];  Service: Endoscopy;  Laterality: N/A;  1pm - moved to 10/20 @ 1:10   ESOPHAGOGASTRODUODENOSCOPY N/A 07/29/2016   Procedure: ESOPHAGOGASTRODUODENOSCOPY (EGD);  Surgeon: Manus Gunning, MD;  Location: [name];  Service: Gastroenterology;  Laterality: N/A;  enteroscopy   ESOPHAGOGASTRODUODENOSCOPY N/A 09/26/2019   Procedure: ESOPHAGOGASTRODUODENOSCOPY (EGD);  Surgeon: Rogene Houston, MD;  [facility];  Service: Endoscopy;  Laterality: N/A;  1250   ESOPHAGOGASTRODUODENOSCOPY (EGD) WITH PROPOFOL N/A 02/05/2021   Procedure: ESOPHAGOGASTRODUODENOSCOPY (EGD) WITH PROPOFOL;  Surgeon: Eloise Harman, DO;  [facility];  Service: Endoscopy;  Laterality: N/A;   GIVENS CAPSULE STUDY N/A 03/07/2019   Procedure: GIVENS CAPSULE STUDY;  Surgeon: Rogene Houston, MD;  [facility];  Service: Endoscopy;  Laterality: N/A;  7:30   GIVENS CAPSULE STUDY N/A 04/22/2021   Procedure: GIVENS CAPSULE STUDY;  Surgeon: Rogene Houston, MD;  [facility];  Service: Endoscopy;  Laterality: N/A;  7:30   INSERTION OF DIALYSIS CATHETER N/A 10/05/2020   Procedure: ABORTED TUNNELED DIALYSIS CATHETER PLACEMENT RIGHT INTERNAL JUGULAR VEIN ;  Surgeon: Virl Cagey, MD;  Location[facility];  Service: General;  Laterality: N/A;   INTRAOPERATIVE TRANSESOPHAGEAL ECHOCARDIOGRAM  06/13/2012   Procedure: INTRAOPERATIVE TRANSESOPHAGEAL ECHOCARDIOGRAM;  Surgeon: Grace Isaac, MD;  Location: [place];  Service: Open Heart Surgery;  Laterality: N/A;   IR DIALY SHUNT INTRO NEEDLE/INTRACATH INITIAL W/IMG LEFT Left 10/06/2020   IR FLUORO GUIDE CV LINE RIGHT  06/17/2020   IR GENERIC HISTORICAL  07/26/2016   IR FLUORO GUIDE CV LINE RIGHT 07/26/2016 Greggory Keen, MD [facility]   IR GENERIC HISTORICAL  07/26/2016   IR US GUIDE VASC ACCESS RIGHT 07/26/2016 Greggory Keen, MD [facility]   IR GENERIC HISTORICAL  08/02/2016   IR US GUIDE VASC ACCESS RIGHT 08/02/2016  Greggory Keen, MD [facility]   IR GENERIC HISTORICAL  08/02/2016   IR FLUORO GUIDE CV LINE RIGHT 08/02/2016 Greggory Keen, MD [facility]   IR RADIOLOGY PERIPHERAL GUIDED IV START  03/28/2017   IR REMOVAL TUN CV CATH W/O FL  08/11/2020   IR THROMBECTOMY AV FISTULA W/THROMBOLYSIS INC/SHUNT/IMG LEFT Left 06/17/2020   IR US GUIDE VASC ACCESS LEFT  06/17/2020   IR US GUIDE VASC ACCESS RIGHT  03/28/2017   IR US GUIDE VASC ACCESS RIGHT  06/17/2020   LEFT HEART CATH AND CORONARY ANGIOGRAPHY N/A 09/20/2016   Procedure: Left Heart Cath and Coronary Angiography;  Surgeon: [name], MD;  Location: [place] [facility];  Service: Cardiovascular;  Laterality: N/A;   LEFT HEART CATH AND CORS/GRAFTS ANGIOGRAPHY N/A 10/13/2016   Procedure: Left Heart Cath and Cors/Grafts Angiography;  Surgeon: Troy Sine, MD;  Location: [place] [facility];  Service: Cardiovascular;  Laterality: N/A;   LEFT HEART CATH AND CORS/GRAFTS ANGIOGRAPHY N/A 07/13/2018   Procedure: LEFT HEART CATH AND CORS/GRAFTS ANGIOGRAPHY;  Surgeon: Martinique, Peter M, MD;  Location: [place] [facility];  Service: Cardiovascular;  Laterality: N/A;   LEFT HEART CATHETERIZATION WITH CORONARY ANGIOGRAM N/A 12/15/2011   Procedure: LEFT HEART CATHETERIZATION WITH CORONARY ANGIOGRAM;  Surgeon: Burnell Blanks, MD;  Location: [facility] CATH LAB;  Service: Cardiovascular;  Laterality: N/A;   LEFT HEART CATHETERIZATION WITH CORONARY ANGIOGRAM N/A 01/10/2012   Procedure: LEFT HEART CATHETERIZATION WITH CORONARY ANGIOGRAM;  Surgeon: Peter M Martinique, MD;  Location: [facility] CATH LAB;  Service: Cardiovascular;  Laterality: N/A;   LEFT HEART CATHETERIZATION WITH CORONARY ANGIOGRAM N/A 06/08/2012   Procedure: LEFT HEART CATHETERIZATION WITH CORONARY ANGIOGRAM;  Surgeon: Burnell Blanks, MD;  Location: [facility] CATH LAB;  Service: Cardiovascular;  Laterality: N/A;   LEFT HEART CATHETERIZATION WITH CORONARY/GRAFT ANGIOGRAM N/A 12/10/2013   Procedure: LEFT HEART CATHETERIZATION WITH  Beatrix Fetters;  Surgeon: [name], MD;  Location: [facility] CATH LAB;  Service: Cardiovascular;  Laterality: N/A;   OVARY SURGERY     ovarian cancer   POLYPECTOMY  03/09/2019   Procedure: POLYPECTOMY;  Surgeon: Rogene Houston, MD;  [facility];  Service: Endoscopy;;  cecal    POLYPECTOMY N/A 09/26/2019   Procedure: DUODENAL POLYPECTOMY;  Surgeon: Rogene Houston, MD;  [facility];  Service: Endoscopy;  Laterality: N/A;   REVISION OF ARTERIOVENOUS GORETEX GRAFT N/A 02/24/2017   Procedure: REVISION OF ARTERIOVENOUS GORETEX GRAFT (RESECTION);  Surgeon: Katha Cabal, MD;  Location: [facility] ORS;  Service: Vascular;  Laterality: N/A;   REVISON OF ARTERIOVENOUS FISTULA  Left 06/19/2020   Procedure: REVISION OF LEFT UPPER ARM AV GRAFT WITH INTERPOSITION JUMP GRAFT USING 6MM GORE LIMB;  Surgeon: Marty Heck, MD;  Location: [place];  Service: Vascular;  Laterality: Left;   SHUNTOGRAM N/A 10/15/2013   Procedure: Fistulogram;  Surgeon: Serafina Mitchell, MD;  Location: [facility] CATH LAB;  Service: Cardiovascular;  Laterality: N/A;   THROMBECTOMY / ARTERIOVENOUS GRAFT REVISION  2011   left upper arm   TUBAL LIGATION  1980's   UPPER EXTREMITY ANGIOGRAPHY Bilateral 12/06/2016   Procedure: Upper Extremity Angiography;  Surgeon: Katha Cabal, MD;  Location: [place] [facility];  Service: Cardiovascular;  Laterality: Bilateral;   UPPER EXTREMITY INTERVENTION Left 06/06/2017   Procedure: UPPER EXTREMITY INTERVENTION;  Surgeon: Katha Cabal, MD;  Location: [place] [facility];  Service: Cardiovascular;  Laterality: Left;    Current Medications: Outpatient Medications Prior to Visit  Medication Sig Dispense Refill   ALPRAZolam (XANAX XR) 0.5 MG 24 hr tablet Take by mouth.     ALPRAZolam (XANAX) 0.5 MG tablet Take 0.5 mg by mouth daily as needed.     aspirin EC 81 MG tablet Take 81 mg by mouth daily.      diclofenac Sodium (VOLTAREN) 1 % GEL Apply 2 g  topically 4 (four) times daily as needed. (Patient taking differently: Apply 2 g topically 4 (four) times daily as needed (pain).) 100 g 0   fluticasone (FLONASE) 50 MCG/ACT nasal spray Place 1 spray into both nostrils at bedtime as needed for allergies.      hydrOXYzine (VISTARIL) 100 MG capsule Take 100 mg by mouth daily as needed for anxiety or itching.     isosorbide mononitrate (IMDUR) 60 MG 24 hr tablet Take 1 tablet (60 mg total) by mouth in the morning and at bedtime. 180 tablet 3   levothyroxine (SYNTHROID) 25 MCG tablet Take 1 tablet (25 mcg total) by mouth daily. 90 tablet 0   lidocaine-prilocaine (EMLA) cream Apply 1 application topically every Monday, Wednesday, and Friday. Prior to dialysis     loperamide (IMODIUM A-D) 2 MG tablet Take 2 tablets (4 mg total) by mouth daily as needed for diarrhea or loose stools.     midodrine (PROAMATINE) 5 MG tablet Take Monday, Wednesday, Friday morning BEFORE dialysis 48 tablet 6   multivitamin (RENA-VIT) TABS tablet Take 1 tablet by mouth daily.     nitroGLYCERIN (NITROSTAT) 0.4 MG SL tablet Place 1 tablet (0.4 mg total) under the tongue every 5 (five) minutes x 3 doses as needed for chest pain (if no relief after 2nd dose, proceed to the ED for an evaluation or call 911). 25 tablet 3   omeprazole (PRILOSEC) 20 MG capsule Take 1 capsule (20 mg total) by mouth daily. 90 capsule 3   sevelamer carbonate (RENVELA) 800 MG tablet Take 2 tablets (1,600 mg total) by mouth 3 (three) times daily with meals. 180 tablet 1   amiodarone (PACERONE) 200 MG tablet Take 1 tablet (200 mg total) by mouth daily. 30 tablet 0   metoprolol tartrate (LOPRESSOR) 50 MG tablet Take 1 tablet (50 mg total) by mouth 2 (two) times daily. 60 tablet 0   simvastatin (ZOCOR) 20 MG tablet Take 20 mg by mouth daily.     No facility-administered medications prior to visit.     Allergies:   Amlodipine, Aspirin, Nitrofurantoin, Penicillins, Bactrim [sulfamethoxazole-trimethoprim],  Contrast media [iodinated diagnostic agents], Iron, Tylenol [acetaminophen], Gabapentin, Iron sucrose, No known allergies, Ranexa [ranolazine], Sucroferric oxyhydroxide, Clopidogrel, Dexilant [  dexlansoprazole], Hydralazine, Levaquin [levofloxacin in d5w], Levofloxacin, Morphine, Morphine and related, Pantoprazole, Plavix [clopidogrel bisulfate], Protonix [pantoprazole sodium], and Venofer [ferric oxide]   Social History   Socioeconomic History   Marital status: Married    Spouse name: [name]   Number of children: Not on file   Years of education: Not on file   Highest education level: Not on file  Occupational History   Not on file  Tobacco Use   Smoking status: Never   Smokeless tobacco: Never  Vaping Use   Vaping Use: Never used  Substance and Sexual Activity   Alcohol use: No    Alcohol/week: 0.0 standard drinks   Drug use: No   Sexual activity: Yes    Birth control/protection: Surgical  Other Topics Concern   Not on file  Social History Narrative   Lives in [place], [place] with husband.  Dialysis pt - [facility]wf.   Social Determinants of Health   Financial Resource Strain: Not on file  Food Insecurity: Not on file  Transportation Needs: Not on file  Physical Activity: Not on file  Stress: Not on file  Social Connections: Not on file     Family History:  The patient's family history includes Colon cancer (age of onset: 71) in her child; Diabetes in her brother, father, mother, and sister; Heart attack in her brother, brother, and mother; Heart disease in her father and mother; Hyperlipidemia in her brother, father, and mother; Hypertension in her father, mother, sister, and sister; Other in an other family member.   Review of Systems:    Please see the history of present illness.     All other systems reviewed and are otherwise negative except as noted above.   Physical Exam:    VS:  BP (!) 142/84   Pulse (!) 58   Ht 5\' 2"  (1.575 m)   Wt 122 lb (55.3 kg)   SpO2 98%    BMI 22.31 kg/m    General: Pleasant elderly female appearing in no acute distress. Head: Normocephalic, atraumatic. Neck: No carotid bruits. JVD not elevated.  Lungs: Respirations regular and unlabored, without wheezes or rales.  Heart: Regular rate and rhythm. No S3 or S4.  No murmur, no rubs, or gallops appreciated. Abdomen: Appears non-distended. No obvious abdominal masses. Msk:  Strength and tone appear normal for age. No obvious joint deformities or effusions. Extremities: No clubbing or cyanosis. Trace ankle edema bilaterally.  Distal pedal pulses are 2+ bilaterally. Neuro: Alert and oriented X 3. Moves all extremities spontaneously. No focal deficits noted. Psych:  Responds to questions appropriately with a normal affect. Skin: No rashes or lesions noted  Wt Readings from Last 3 Encounters:  05/13/21 122 lb (55.3 kg)  05/06/21 120 lb (54.4 kg)  04/23/21 272 lb 7.8 oz (123.6 kg)      Studies/Labs Reviewed:   EKG:  EKG is not ordered today.    Recent Labs: 09/29/2020: B Natriuretic Peptide 161.0 02/27/2021: ALT 12 04/09/2021: Magnesium 1.7; TSH 4.509 04/24/2021: BUN 22; Creatinine, Ser 5.10; Hemoglobin 12.2; Platelets 199; Potassium 4.3; Sodium 135   Lipid Panel    Component Value Date/Time   CHOL 123 11/10/2020 0933   TRIG 87 11/10/2020 0933   HDL 58 11/10/2020 0933   CHOLHDL 2.1 11/10/2020 0933   VLDL 17 11/10/2020 0933   LDLCALC 48 11/10/2020 0933    Additional studies/ records that were reviewed today include:   Echocardiogram: 03/15/2021 IMPRESSIONS     1. Left ventricular ejection fraction, by estimation,  is 50%. The left  ventricle has low normal function. The left ventricle has no regional wall  motion abnormalities. There is mild left ventricular hypertrophy. Left  ventricular diastolic parameters are   consistent with Grade II diastolic dysfunction (pseudonormalization).  Elevated left atrial pressure.   2. Right ventricular systolic function is  normal. The right ventricular  size is normal. There is moderately elevated pulmonary artery systolic  pressure.   3. Left atrial size was severely dilated.   4. Right atrial size was mildly dilated.   5. The mitral valve is abnormal. Moderate mitral valve regurgitation.  Mild mitral stenosis. Moderate mitral annular calcification.   6. The tricuspid valve is abnormal. Tricuspid valve regurgitation is  moderate.   7. The aortic valve is tricuspid. There is moderate calcification of the  aortic valve. There is moderate thickening of the aortic valve. Aortic  valve regurgitation is not visualized. No aortic stenosis is present.   8. The inferior vena cava is normal in size with greater than 50%  respiratory variability, suggesting right atrial pressure of 3 mmHg.   Assessment:    1. Coronary artery disease involving native coronary artery of native heart without angina pectoris   2. Chronic diastolic heart failure (HCC)   3. Paroxysmal atrial fibrillation ([place])   4. Essential hypertension   5. Hyperlipidemia LDL goal <70      Plan:   In order of problems listed above:  1. CAD - She is s/p CABG in 2013, DES to D1 in 10/2016 and cath in 07/2018 showing patent grafts with occlusion of D1 at prior stent site and progression of PDA disease and medical management was recommended. - She denies any recent chest pain or worsening dyspnea on exertion. - Continue current medication regimen with ASA 81mg  daily, Imdur 60mg  BID and Lopressor 50mg  BID. Will switch Simvastatin to Crestor 10mg  daily given the interaction with Amiodarone.   2. HFmrEF - Recent echocardiogram showed her EF was at 50%. Volume status is managed by HD. Continue Lopressor and Imdur. BP has not allowed for Hydralazine given drops in BP with HD and not specifically indicated given only mild reduction in EF. No ACE-I/ARB or SGLT2 inhibitor given her ESRD.   3. Paroxysmal Atrial Fibrillation/SVT - She has known atrial  fibrillation but has not been on anticoagulation given her baseline anemia and prior GIB's. She is on Lopressor 50mg  BID but has hypotension during HD. We reviewed she could try holding Lopressor the morning of her HD sessions to see if this helps with her BP. Continue recently restarted Amiodarone 200mg  daily. TSH being followed by Endocrinology given hypothyroidism.   4. HTN - BP is at 142/84 today but she has been having hypotension during HD. She does take Midodrine 5mg  MWF prior to HD and we reviewed she could hold Lopressor the mornings of HD and see if this helps with her pressures.   5. HLD -  Her LDL was at 48 in 11/2020. Given the use of Amiodarone, will stop Simvastatin 20mg  daily and switch to Crestor 10mg  daily. Would recheck FLP and LFT's in 3 months if not checked by her PCP in the interim.    Medication Adjustments/Labs and Tests Ordered: Current medicines are reviewed at length with the patient today.  Concerns regarding medicines are outlined above.  Medication changes, Labs and Tests ordered today are listed in the Patient Instructions below. Patient Instructions  Medication Instructions:   STOP Simvastatin (Zocor)  START Rosuvastatin (Crestor) for your cholesterol  DO NOT TAKE METOPROLOL (LOPRESSOR) THE MORNING OF DIALYSIS ON Monday,Wednesday, OR FRIDAYS    *If you need a refill on your cardiac medications before your next appointment, please call your pharmacy*   Lab Work: NONE TODAY  If you have labs (blood work) drawn today and your tests are completely normal, you will receive your results only by: [place] (if you have MyChart) OR A paper copy in the mail If you have any lab test that is abnormal or we need to change your treatment, we will call you to review the results.   Testing/Procedures: NONE TODAY    Follow-Up: At [facility], you and your health needs are our priority.  As part of our continuing mission to provide you with  exceptional heart care, we have created designated Provider Care Teams.  These Care Teams include your primary Cardiologist (physician) and Advanced Practice Providers (APPs -  Physician Assistants and Nurse Practitioners) who all work together to provide you with the care you need, when you need it.  We recommend signing up for the patient portal called "MyChart".  Sign up information is provided on this After Visit Summary.  MyChart is used to connect with patients for Virtual Visits (Telemedicine).  Patients are able to view lab/test results, encounter notes, upcoming appointments, etc.  Non-urgent messages can be sent to your provider as well.   To learn more about what you can do with MyChart, go to [email or web address].    Your next appointment:   3 month(s)  The format for your next appointment:   In Person  Provider:   Carlyle Dolly, MD   Other Instructions NONE    Signed, Erma Heritage, PA-C  05/13/2021 8:25 PM    [facility] [address] [place], [place] [address] Phone: [phone] Fax: [phone]

## 2021-05-13 ENCOUNTER — Ambulatory Visit (INDEPENDENT_AMBULATORY_CARE_PROVIDER_SITE_OTHER): Payer: Medicare HMO | Admitting: Student

## 2021-05-13 ENCOUNTER — Other Ambulatory Visit: Payer: Self-pay

## 2021-05-13 ENCOUNTER — Encounter: Payer: Self-pay | Admitting: Student

## 2021-05-13 VITALS — BP 142/84 | HR 58 | Ht 62.0 in | Wt 122.0 lb

## 2021-05-13 DIAGNOSIS — I2510 Atherosclerotic heart disease of native coronary artery without angina pectoris: Secondary | ICD-10-CM | POA: Diagnosis not present

## 2021-05-13 DIAGNOSIS — I480 Paroxysmal atrial fibrillation: Secondary | ICD-10-CM

## 2021-05-13 DIAGNOSIS — I10 Essential (primary) hypertension: Secondary | ICD-10-CM | POA: Diagnosis not present

## 2021-05-13 DIAGNOSIS — E785 Hyperlipidemia, unspecified: Secondary | ICD-10-CM

## 2021-05-13 DIAGNOSIS — I5032 Chronic diastolic (congestive) heart failure: Secondary | ICD-10-CM | POA: Diagnosis not present

## 2021-05-13 MED ORDER — METOPROLOL TARTRATE 50 MG PO TABS: 50.0000 mg | ORAL_TABLET | Freq: Two times a day (BID) | ORAL | 1 refills | Status: DC

## 2021-05-13 MED ORDER — AMIODARONE HCL 200 MG PO TABS: 200.0000 mg | ORAL_TABLET | Freq: Every day | ORAL | 2 refills | Status: DC

## 2021-05-13 MED ORDER — ROSUVASTATIN CALCIUM 10 MG PO TABS: 10.0000 mg | ORAL_TABLET | Freq: Every day | ORAL | 3 refills | Status: DC

## 2021-05-13 NOTE — Patient Instructions (Signed)
Medication Instructions:   STOP Simvastatin (Zocor)  START Rosuvastatin (Crestor) for your cholesterol   DO NOT TAKE METOPROLOL (LOPRESSOR) THE MORNING OF DIALYSIS ON Monday,Wednesday, OR FRIDAYS    *If you need a refill on your cardiac medications before your next appointment, please call your pharmacy*   Lab Work: NONE TODAY  If you have labs (blood work) drawn today and your tests are completely normal, you will receive your results only by: [place] (if you have MyChart) OR A paper copy in the mail If you have any lab test that is abnormal or we need to change your treatment, we will call you to review the results.   Testing/Procedures: NONE TODAY    Follow-Up: At [facility], you and your health needs are our priority.  As part of our continuing mission to provide you with exceptional heart care, we have created designated Provider Care Teams.  These Care Teams include your primary Cardiologist (physician) and Advanced Practice Providers (APPs -  Physician Assistants and Nurse Practitioners) who all work together to provide you with the care you need, when you need it.  We recommend signing up for the patient portal called "MyChart".  Sign up information is provided on this After Visit Summary.  MyChart is used to connect with patients for Virtual Visits (Telemedicine).  Patients are able to view lab/test results, encounter notes, upcoming appointments, etc.  Non-urgent messages can be sent to your provider as well.   To learn more about what you can do with MyChart, go to [email or web address].    Your next appointment:   3 month(s)  The format for your next appointment:   In Person  Provider:   Carlyle Dolly, MD   Other Instructions NONE

## 2021-05-20 ENCOUNTER — Ambulatory Visit (INDEPENDENT_AMBULATORY_CARE_PROVIDER_SITE_OTHER): Payer: Medicare HMO | Admitting: Gastroenterology

## 2021-05-20 ENCOUNTER — Encounter (INDEPENDENT_AMBULATORY_CARE_PROVIDER_SITE_OTHER): Payer: Self-pay | Admitting: Gastroenterology

## 2021-05-20 ENCOUNTER — Telehealth (INDEPENDENT_AMBULATORY_CARE_PROVIDER_SITE_OTHER): Payer: Self-pay

## 2021-05-20 ENCOUNTER — Other Ambulatory Visit: Payer: Self-pay

## 2021-05-20 ENCOUNTER — Encounter (INDEPENDENT_AMBULATORY_CARE_PROVIDER_SITE_OTHER): Payer: Self-pay

## 2021-05-20 ENCOUNTER — Other Ambulatory Visit (INDEPENDENT_AMBULATORY_CARE_PROVIDER_SITE_OTHER): Payer: Self-pay

## 2021-05-20 VITALS — BP 134/48 | HR 58 | Temp 97.7°F | Ht 61.0 in | Wt 120.2 lb

## 2021-05-20 DIAGNOSIS — K580 Irritable bowel syndrome with diarrhea: Secondary | ICD-10-CM | POA: Diagnosis not present

## 2021-05-20 DIAGNOSIS — D132 Benign neoplasm of duodenum: Secondary | ICD-10-CM

## 2021-05-20 DIAGNOSIS — D508 Other iron deficiency anemias: Secondary | ICD-10-CM | POA: Diagnosis not present

## 2021-05-20 DIAGNOSIS — K529 Noninfective gastroenteritis and colitis, unspecified: Secondary | ICD-10-CM

## 2021-05-20 NOTE — H&P (View-Only) (Signed)
[name], M.D. Gastroenterology & Hepatology [facility] [address] [place], [place] [address]  Primary Care Physician: Practice, [facility] [place] [address]  I will communicate my assessment and recommendations to the referring MD via EMR.  Problems: Change in bowel habits Small bowel polyps Iron deficiency anemia  History of Present Illness: Shawna [name] is a 80 y.o. female with with PMH ESRD on dyalisis, congestive heart failure, history of colon cancer, TIA, PAF, history of AVMs, HLD, HTN, CAD status post CABG and stent placement, duodenal and jejunal polyps, iron deficiency anemia, who presents for follow evaluation of changes in bowel movements.  The patient was last seen on 04/13/2021 was scheduled to undergo a capsule endoscopy due to purulent iron deficiency anemia requiring transfusion.  Capsule endoscopy performed on 04/22/21 which showed: Patient was able to swallow given capsule without any difficulty. Study is complete as capsule reached colon in study period. Small flat polyp noted in second or third part of the duodenum without stigmata of bleeding best seen on image at 33 minutes. 2 polyps were seen in distal small bowel without stigmata.  1 polyp was small and the other polyp medium size.  These polyps can be seen on images at 02:56:34 and 02:57:04. No fresh or old blood noted in stomach or small bowel.  Dr. Laural Golden called the patient to schedule push enteroscopy and colonoscopy with terminal ileum intubation to attempt resection of polyps but it was never scheduled.  Notably, her most recent hemoglobin on 04/24/2021 showed a value of 12.2 with MCV of 111, white blood cell count was 6.7 platelets were 199.  Folic acid and vitamin B12 levels were normal on 02/04/2021.  Patient reports that for the last couple of years she has presented changes in her bowel movements, which she described as  having intermittent diarrhea up to 4 times a day. States that after she had her capsule endoscopy, she noticed worsening diarrhea up to 8 times a day. She has frequent urgency and some episodes of fecal smearing x1. She is currently taking 1-2 tablets of Imodium per day, but is fearful of taking more as this may cause constipation. She describes her Bms as soft but not watery. She felt some nausea yesterday but no vomiting. Patient reports that she has presented intermittent episodes of pain in her epigastric area and the LLQ.  The patient denies having any nausea, vomiting, fever, chills, hematochezia, melena, hematemesis, abdominal distention, abdominal pain,  jaundice, pruritus. She is al most eating as usual, but sometimes her appetite is decreased. She may miss a couple of meals a week. Lost 12 lb recently.  No magnesium supplements or other OTC supplements. Takes omeprazole as needed.  Last EGD: 02/05/21 - candidiasis in esophagus, gastritis Last Colonoscopy: 03/09/2019 - Diverticulosis in the entire examined colon. - Patent end-to-side ileo-colonic anastomosis. - Two small polyps in the proximal ascending colon, removed with a cold snare. Resected and retrieved. - One large polyp in the proximal transverse colon, removed piecemeal using a hot snare. Complete resection via EMR. Polyps were TA.  Past Medical History: Past Medical History:  Diagnosis Date   Acute on chronic respiratory failure with hypoxia ([place]) 10/10/2016   Anxiety    Arthritis    AVM (arteriovenous malformation) of colon    CAD (coronary artery disease)    a. s/p CABG in 2013 b. DES to D1 in 10/2016. c. cath in 07/2018 showing patent grafts with  occlusion of D1 at prior stent site and progression of PDA disease --> medical management recommended   Carotid artery disease ([place])    a. 09-38% LICA, 07/8297    Chronic anemia    Chronic bronchitis (HCC)    Chronic diastolic CHF (congestive heart failure) ([place])    a. 02/2012  Echo EF 60-65%, nl wall motion, Gr 1 DD, mod MR   Colon cancer ([place]) 1992   Esophageal stricture    ESRD on hemodialysis ([place])    ESRD due to HTN, started dialysis 2011 and gets HD at [facility] with Dr Hinda Lenis on MWF schedule.  Access is LUA AVF as of Sept 2014.    GERD (gastroesophageal reflux disease)    High cholesterol 12/2011   History of blood transfusion 07/2011; 12/2011; 01/2012 X 2; 04/2012   History of gout    History of lower GI bleeding    Hypertension    Iron deficiency anemia    Jugular vein occlusion, right (HCC)    Mitral regurgitation    a. Moderate by echo, 02/2012   Myocardial infarction [facility])    NSVT (nonsustained ventricular tachycardia)    Ovarian cancer ([place]) 1992   PAF (paroxysmal atrial fibrillation) ([place])    Pneumonia ~ 2009   PUD (peptic ulcer disease)    TIA (transient ischemic attack)     Past Surgical History: Past Surgical History:  Procedure Laterality Date   A/V SHUNTOGRAM Left 03/19/2019   Procedure: A/V SHUNTOGRAM;  Surgeon: Katha Cabal, MD;  Location: [place] [facility];  Service: Cardiovascular;  Laterality: Left;   ABDOMINAL HYSTERECTOMY  1992   APPENDECTOMY  06/1990   AV FISTULA PLACEMENT  07/2009   left upper arm   AV FISTULA PLACEMENT Right 09/06/2016   Procedure: RIGHT FOREARM ARTERIOVENOUS (AV) GRAFT;  Surgeon: Elam Dutch, MD;  Location: [place];  Service: Vascular;  Laterality: Right;   AV FISTULA PLACEMENT N/A 02/24/2017   Procedure: INSERTION OF ARTERIOVENOUS (AV) GORE-TEX GRAFT ARM (BRACHIAL AXILLARY);  Surgeon: Katha Cabal, MD;  Location: [facility] ORS;  Service: Vascular;  Laterality: N/A;   Alfred Right 09/06/2016   Procedure: REMOVAL OF Right Arm ARTERIOVENOUS GORETEX GRAFT and Vein Patch angioplasty of brachial artery;  Surgeon: Angelia Mould, MD;  Location: [place];  Service: Vascular;  Laterality: Right;   BIOPSY  09/26/2019   Procedure: BIOPSY;  Surgeon: Rogene Houston, MD;  [facility];   Service: Endoscopy;;   COLON RESECTION  1992   COLON SURGERY     COLONOSCOPY N/A 03/09/2019   Procedure: COLONOSCOPY;  Surgeon: Rogene Houston, MD;  [facility];  Service: Endoscopy;  Laterality: N/A;   CORONARY ANGIOPLASTY WITH STENT PLACEMENT  12/15/11   "2"   CORONARY ANGIOPLASTY WITH STENT PLACEMENT  y/2013   "1; makes total of 3" (05/02/2012)   CORONARY ARTERY BYPASS GRAFT  06/13/2012   Procedure: CORONARY ARTERY BYPASS GRAFTING (CABG);  Surgeon: Grace Isaac, MD;  Location: [place];  Service: Open Heart Surgery;  Laterality: N/A;  cabg x four;  using left internal mammary artery, and left leg greater saphenous vein harvested endoscopically   CORONARY STENT INTERVENTION N/A 10/13/2016   Procedure: Coronary Stent Intervention;  Surgeon: Troy Sine, MD;  Location: [place] [facility];  Service: Cardiovascular;  Laterality: N/A;   DIALYSIS/PERMA CATHETER REMOVAL N/A 04/18/2017   Procedure: DIALYSIS/PERMA CATHETER REMOVAL;  Surgeon: Katha Cabal, MD;  Location: [place] [facility];  Service: Cardiovascular;  Laterality: N/A;   DILATION AND CURETTAGE OF UTERUS     ESOPHAGOGASTRODUODENOSCOPY  01/20/2012   Procedure: ESOPHAGOGASTRODUODENOSCOPY (EGD);  Surgeon: Ladene Artist, MD,FACG;  Location: [facility] ENDOSCOPY;  Service: Endoscopy;  Laterality: N/A;   ESOPHAGOGASTRODUODENOSCOPY N/A 03/26/2013   Procedure: ESOPHAGOGASTRODUODENOSCOPY (EGD);  Surgeon: Irene Shipper, MD;  Location: [facility] ENDOSCOPY;  Service: Endoscopy;  Laterality: N/A;   ESOPHAGOGASTRODUODENOSCOPY N/A 04/30/2015   Procedure: ESOPHAGOGASTRODUODENOSCOPY (EGD);  Surgeon: [place] Houston, MD;  [facility];  Service: Endoscopy;  Laterality: N/A;  1pm - moved to 10/20 @ 1:10   ESOPHAGOGASTRODUODENOSCOPY N/A 07/29/2016   Procedure: ESOPHAGOGASTRODUODENOSCOPY (EGD);  Surgeon: Manus Gunning, MD;  Location: [place];  Service: Gastroenterology;  Laterality: N/A;  enteroscopy   ESOPHAGOGASTRODUODENOSCOPY  N/A 09/26/2019   Procedure: ESOPHAGOGASTRODUODENOSCOPY (EGD);  Surgeon: Rogene Houston, MD;  [facility];  Service: Endoscopy;  Laterality: N/A;  1250   ESOPHAGOGASTRODUODENOSCOPY (EGD) WITH PROPOFOL N/A 02/05/2021   Procedure: ESOPHAGOGASTRODUODENOSCOPY (EGD) WITH PROPOFOL;  Surgeon: Eloise Harman, DO;  [facility];  Service: Endoscopy;  Laterality: N/A;   GIVENS CAPSULE STUDY N/A 03/07/2019   Procedure: GIVENS CAPSULE STUDY;  Surgeon: Rogene Houston, MD;  [facility];  Service: Endoscopy;  Laterality: N/A;  7:30   GIVENS CAPSULE STUDY N/A 04/22/2021   Procedure: GIVENS CAPSULE STUDY;  Surgeon: Rogene Houston, MD;  [facility];  Service: Endoscopy;  Laterality: N/A;  7:30   INSERTION OF DIALYSIS CATHETER N/A 10/05/2020   Procedure: ABORTED TUNNELED DIALYSIS CATHETER PLACEMENT RIGHT INTERNAL JUGULAR VEIN ;  Surgeon: Virl Cagey, MD;  [facility];  Service: General;  Laterality: N/A;   INTRAOPERATIVE TRANSESOPHAGEAL ECHOCARDIOGRAM  06/13/2012   Procedure: INTRAOPERATIVE TRANSESOPHAGEAL ECHOCARDIOGRAM;  Surgeon: Grace Isaac, MD;  Location: [place];  Service: Open Heart Surgery;  Laterality: N/A;   IR DIALY SHUNT INTRO NEEDLE/INTRACATH INITIAL W/IMG LEFT Left 10/06/2020   IR FLUORO GUIDE CV LINE RIGHT  06/17/2020   IR GENERIC HISTORICAL  07/26/2016   IR FLUORO GUIDE CV LINE RIGHT 07/26/2016 Greggory Keen, MD [facility]   IR GENERIC HISTORICAL  07/26/2016   IR US GUIDE VASC ACCESS RIGHT 07/26/2016 Greggory Keen, MD [facility]   IR GENERIC HISTORICAL  08/02/2016   IR US GUIDE VASC ACCESS RIGHT 08/02/2016 Greggory Keen, MD [facility]   IR GENERIC HISTORICAL  08/02/2016   IR FLUORO GUIDE CV LINE RIGHT 08/02/2016 Greggory Keen, MD [facility]   IR RADIOLOGY PERIPHERAL GUIDED IV START  03/28/2017   IR REMOVAL TUN CV CATH W/O FL  08/11/2020   IR THROMBECTOMY AV FISTULA W/THROMBOLYSIS INC/SHUNT/IMG LEFT Left 06/17/2020   IR US GUIDE VASC  ACCESS LEFT  06/17/2020   IR US GUIDE VASC ACCESS RIGHT  03/28/2017   IR US GUIDE VASC ACCESS RIGHT  06/17/2020   LEFT HEART CATH AND CORONARY ANGIOGRAPHY N/A 09/20/2016   Procedure: Left Heart Cath and Coronary Angiography;  Surgeon: [name], MD;  Location: [place] [facility];  Service: Cardiovascular;  Laterality: N/A;   LEFT HEART CATH AND CORS/GRAFTS ANGIOGRAPHY N/A 10/13/2016   Procedure: Left Heart Cath and Cors/Grafts Angiography;  Surgeon: Troy Sine, MD;  Location: [place] [facility];  Service: Cardiovascular;  Laterality: N/A;   LEFT HEART CATH AND CORS/GRAFTS ANGIOGRAPHY N/A 07/13/2018   Procedure: LEFT HEART CATH AND CORS/GRAFTS ANGIOGRAPHY;  Surgeon: Martinique, Peter M, MD;  Location: [place] [facility];  Service: Cardiovascular;  Laterality: N/A;   LEFT HEART CATHETERIZATION WITH CORONARY ANGIOGRAM N/A 12/15/2011   Procedure: LEFT HEART CATHETERIZATION WITH CORONARY ANGIOGRAM;  Surgeon: Burnell Blanks, MD;  Location: [facility] CATH LAB;  Service: Cardiovascular;  Laterality: N/A;   LEFT HEART CATHETERIZATION WITH CORONARY ANGIOGRAM N/A 01/10/2012   Procedure: LEFT HEART CATHETERIZATION WITH CORONARY ANGIOGRAM;  Surgeon: Peter M Martinique, MD;  Location: [facility] CATH LAB;  Service: Cardiovascular;  Laterality: N/A;   LEFT HEART CATHETERIZATION WITH CORONARY ANGIOGRAM N/A 06/08/2012   Procedure: LEFT HEART CATHETERIZATION WITH CORONARY ANGIOGRAM;  Surgeon: Burnell Blanks, MD;  Location[facility] CATH LAB;  Service: Cardiovascular;  Laterality: N/A;   LEFT HEART CATHETERIZATION WITH CORONARY/GRAFT ANGIOGRAM N/A 12/10/2013   Procedure: LEFT HEART CATHETERIZATION WITH Beatrix Fetters;  Surgeon: [name], MD;  Location: [facility] CATH LAB;  Service: Cardiovascular;  Laterality: N/A;   OVARY SURGERY     ovarian cancer   POLYPECTOMY  03/09/2019   Procedure: POLYPECTOMY;  Surgeon: Rogene Houston, MD;  [facility];  Service: Endoscopy;;  cecal    POLYPECTOMY N/A 09/26/2019    Procedure: DUODENAL POLYPECTOMY;  Surgeon: Rogene Houston, MD;  [facility];  Service: Endoscopy;  Laterality: N/A;   REVISION OF ARTERIOVENOUS GORETEX GRAFT N/A 02/24/2017   Procedure: REVISION OF ARTERIOVENOUS GORETEX GRAFT (RESECTION);  Surgeon: Katha Cabal, MD;  Location: [facility] ORS;  Service: Vascular;  Laterality: N/A;   REVISON OF ARTERIOVENOUS FISTULA Left 06/19/2020   Procedure: REVISION OF LEFT UPPER ARM AV GRAFT WITH INTERPOSITION JUMP GRAFT USING 6MM GORE LIMB;  Surgeon: Marty Heck, MD;  Location: [place];  Service: Vascular;  Laterality: Left;   SHUNTOGRAM N/A 10/15/2013   Procedure: Fistulogram;  Surgeon: Serafina Mitchell, MD;  Location: [facility] CATH LAB;  Service: Cardiovascular;  Laterality: N/A;   THROMBECTOMY / ARTERIOVENOUS GRAFT REVISION  2011   left upper arm   TUBAL LIGATION  1980's   UPPER EXTREMITY ANGIOGRAPHY Bilateral 12/06/2016   Procedure: Upper Extremity Angiography;  Surgeon: Katha Cabal, MD;  Location: [place] [facility];  Service: Cardiovascular;  Laterality: Bilateral;   UPPER EXTREMITY INTERVENTION Left 06/06/2017   Procedure: UPPER EXTREMITY INTERVENTION;  Surgeon: Katha Cabal, MD;  Location: [place] [facility];  Service: Cardiovascular;  Laterality: Left;    Family History: Family History  Problem Relation Age of Onset   Heart disease Mother        Heart Disease before age 50   Hyperlipidemia Mother    Hypertension Mother    Diabetes Mother    Heart attack Mother    Heart disease Father        Heart Disease before age 14   Hyperlipidemia Father    Hypertension Father    Diabetes Father    Diabetes Sister    Hypertension Sister    Diabetes Brother    Hyperlipidemia Brother    Heart attack Brother    Hypertension Sister    Heart attack Brother    Colon cancer Child 19   Other Other        noncontributory for early CAD   Esophageal cancer Neg Hx    Liver disease Neg Hx    Kidney disease Neg Hx    Colon  polyps Neg Hx     Social History: Social History   Tobacco Use  Smoking Status Never  Smokeless Tobacco Never   Social History   Substance and Sexual Activity  Alcohol Use No   Alcohol/week:  0.0 standard drinks   Social History   Substance and Sexual Activity  Drug Use No    Allergies: Allergies  Allergen Reactions   Amlodipine Swelling   Aspirin Other (See Comments)    High Doses Mess up her stomach; "makes my bowels have blood in them". Takes 81 mg EC Aspirin    Nitrofurantoin Hives   Penicillins Other (See Comments)    SYNCOPE? , "makes me real weak when I take it; like I'll pass out"  Has patient had a PCN reaction causing immediate rash, facial/tongue/throat swelling, SOB or lightheadedness with hypotension: Yes Has patient had a PCN reaction causing severe rash involving mucus membranes or skin necrosis: no Has patient had a PCN reaction that required hospitalization no Has patient had a PCN reaction occurring within the last 10 years: no If all of the above Other reaction(s): Other (See Comments) SYNCOPE? , "makes me real weak when I take it; like I'll pass out"  Has patient had a PCN reaction causing immediate rash, facial/tongue/throat swelling, SOB or lightheadedness with hypotension: Yes Has patient had a PCN reaction causing severe rash involving mucus membranes or skin necrosis: no Has patient had a PCN reaction that required hospitalization no Has patient had a PCN reaction occurring within the last 10 years: no If all of the above I FELT LIKE I WAS GOING TO PASS OUT   Bactrim [Sulfamethoxazole-Trimethoprim] Rash   Contrast Media [Iodinated Diagnostic Agents] Itching   Iron Itching and Other (See Comments)    "they gave me iron in dialysis; had to give me Benadryl cause I had to have the iron" (05/02/2012)   Tylenol [Acetaminophen] Itching and Other (See Comments)    Makes her feet on fire per pt   Gabapentin Other (See Comments)    Unknown reaction    Iron Sucrose    No Known Allergies    Ranexa [Ranolazine] Other (See Comments)    Myoclonus-hospitalized    Sucroferric Oxyhydroxide    Clopidogrel Rash   Dexilant [Dexlansoprazole] Other (See Comments)    Upset stomach   Hydralazine Itching    Has tolerated while inpatient Has tolerated while inpatient   Levaquin [Levofloxacin In D5w] Rash   Levofloxacin Rash   Morphine Itching   Morphine And Related Itching and Other (See Comments)    Itching in feet   Pantoprazole Rash   Plavix [Clopidogrel Bisulfate] Rash   Protonix [Pantoprazole Sodium] Rash   Venofer [Ferric Oxide] Itching and Other (See Comments)    Patient reports using Benadryl prior to doses as [facility]    Medications: Current Outpatient Medications  Medication Sig Dispense Refill   ALPRAZolam (XANAX) 0.5 MG tablet Take 0.5 mg by mouth daily as needed.     amiodarone (PACERONE) 200 MG tablet Take 1 tablet (200 mg total) by mouth daily. 90 tablet 2   aspirin EC 81 MG tablet Take 81 mg by mouth daily.      diclofenac Sodium (VOLTAREN) 1 % GEL Apply 2 g topically 4 (four) times daily as needed. (Patient taking differently: Apply 2 g topically 4 (four) times daily as needed (pain).) 100 g 0   fluticasone (FLONASE) 50 MCG/ACT nasal spray Place 1 spray into both nostrils at bedtime as needed for allergies.      hydrOXYzine (VISTARIL) 100 MG capsule Take 100 mg by mouth daily as needed for anxiety or itching.     isosorbide mononitrate (IMDUR) 60 MG 24 hr tablet Take 1 tablet (60 mg total) by  mouth in the morning and at bedtime. 180 tablet 3   levothyroxine (SYNTHROID) 25 MCG tablet Take 1 tablet (25 mcg total) by mouth daily. 90 tablet 0   lidocaine-prilocaine (EMLA) cream Apply 1 application topically every Monday, Wednesday, and Friday. Prior to dialysis     loperamide (IMODIUM A-D) 2 MG tablet Take 2 tablets (4 mg total) by mouth daily as needed for diarrhea or loose stools.     midodrine (PROAMATINE) 5 MG tablet  Take Monday, Wednesday, Friday morning BEFORE dialysis 48 tablet 6   multivitamin (RENA-VIT) TABS tablet Take 1 tablet by mouth daily.     nitroGLYCERIN (NITROSTAT) 0.4 MG SL tablet Place 1 tablet (0.4 mg total) under the tongue every 5 (five) minutes x 3 doses as needed for chest pain (if no relief after 2nd dose, proceed to the ED for an evaluation or call 911). 25 tablet 3   omeprazole (PRILOSEC) 20 MG capsule Take 1 capsule (20 mg total) by mouth daily. 90 capsule 3   rosuvastatin (CRESTOR) 10 MG tablet Take 1 tablet (10 mg total) by mouth daily. 90 tablet 3   sevelamer carbonate (RENVELA) 800 MG tablet Take 2 tablets (1,600 mg total) by mouth 3 (three) times daily with meals. 180 tablet 1   metoprolol tartrate (LOPRESSOR) 50 MG tablet Take 1 tablet (50 mg total) by mouth 2 (two) times daily. (Patient not taking: Reported on 05/20/2021) 180 tablet 1   No current facility-administered medications for this visit.    Review of Systems: GENERAL: negative for malaise, night sweats HEENT: No changes in hearing or vision, no nose bleeds or other nasal problems. NECK: Negative for lumps, goiter, pain and significant neck swelling RESPIRATORY: Negative for cough, wheezing CARDIOVASCULAR: Negative for chest pain, leg swelling, palpitations, orthopnea GI: SEE HPI MUSCULOSKELETAL: Negative for joint pain or swelling, back pain, and muscle pain. SKIN: Negative for lesions, rash PSYCH: Negative for sleep disturbance, mood disorder and recent psychosocial stressors. HEMATOLOGY Negative for prolonged bleeding, bruising easily, and swollen nodes. ENDOCRINE: Negative for cold or heat intolerance, polyuria, polydipsia and goiter. NEURO: negative for tremor, gait imbalance, syncope and seizures. The remainder of the review of systems is noncontributory.   Physical Exam: BP (!) 134/48 (BP Location: Left Arm, Patient Position: Sitting, Cuff Size: Large)   Pulse (!) 58   Temp 97.7 F (36.5 C) (Oral)    Ht 5\' 1"  (1.549 m)   Wt 120 lb 3.2 oz (54.5 kg)   BMI 22.71 kg/m  GENERAL: The patient is AO x3, in no acute distress. HEENT: Head is normocephalic and atraumatic. EOMI are intact. Mouth is well hydrated and without lesions. NECK: Supple. No masses LUNGS: Clear to auscultation. No presence of rhonchi/wheezing/rales. Adequate chest expansion HEART: RRR, normal s1 and s2. ABDOMEN: Soft, nontender, no guarding, no peritoneal signs, and nondistended. BS +. No masses. EXTREMITIES: Without any cyanosis, clubbing, rash, lesions or edema. NEUROLOGIC: AOx3, no focal motor deficit. SKIN: no jaundice, no rashes  Imaging/Labs: as above  I personally reviewed and interpreted the available labs, imaging and endoscopic files.  Impression and Plan: VEEDA VIRGO is a 80 y.o. female with with PMH ESRD on dyalisis, congestive heart failure, history of colon cancer, TIA, PAF, history of AVMs, HLD, HTN, CAD status post CABG and stent placement, duodenal and jejunal polyps, iron deficiency anemia, who presents for follow evaluation of changes in bowel movements.  Patient has presented a chronic history of diarrhea unclear etiology.  She has also presented some abdominal  complaints in the past.  She has not presented any new findings in CT of the abdomen and pelvis she had in 2021 we will explain the diarrhea.  We will perform stool studies and check CBC, CMP and TSH.  I advised her to continue with Imodium and can uptitrate the use of this medication to improve her symptoms.  If this does not lead to improvement of symptoms, we can consider trying a bile salt binder.  At this point, we will reorder the push enteroscopy and colonoscopy for management of the duodenal and jejunal polyps.  I discussed with the patient that it may be possible that these lesions may not be reached or resected during this procedure.  She understood and agreed.  We will also obtain random colonic biopsies at that time.  - Schedule push  enteroscopy and colonoscopy with random colonic biopsies - Check CBC, CMP, iron studies, TSH, C. Diff and GI pathogen panel -Continue Imodium as needed for diarrhea  All questions were answered.      Harvel Quale, MD Gastroenterology and Hepatology [facility]

## 2021-05-20 NOTE — Progress Notes (Signed)
[name], M.D. Gastroenterology & Hepatology [facility] [address] [place], [place] [address]  Primary Care Physician: Practice, [facility] [place] [address]  I will communicate my assessment and recommendations to the referring MD via EMR.  Problems: Change in bowel habits Small bowel polyps Iron deficiency anemia  History of Present Illness: Shawna [name] is a 80 y.o. female with with PMH ESRD on dyalisis, congestive heart failure, history of colon cancer, TIA, PAF, history of AVMs, HLD, HTN, CAD status post CABG and stent placement, duodenal and jejunal polyps, iron deficiency anemia, who presents for follow evaluation of changes in bowel movements.  The patient was last seen on 04/13/2021 was scheduled to undergo a capsule endoscopy due to purulent iron deficiency anemia requiring transfusion.  Capsule endoscopy performed on 04/22/21 which showed: Patient was able to swallow given capsule without any difficulty. Study is complete as capsule reached colon in study period. Small flat polyp noted in second or third part of the duodenum without stigmata of bleeding best seen on image at 33 minutes. 2 polyps were seen in distal small bowel without stigmata.  1 polyp was small and the other polyp medium size.  These polyps can be seen on images at 02:56:34 and 02:57:04. No fresh or old blood noted in stomach or small bowel.  Dr. Laural Golden called the patient to schedule push enteroscopy and colonoscopy with terminal ileum intubation to attempt resection of polyps but it was never scheduled.  Notably, her most recent hemoglobin on 04/24/2021 showed a value of 12.2 with MCV of 111, white blood cell count was 6.7 platelets were 199.  Folic acid and vitamin B12 levels were normal on 02/04/2021.  Patient reports that for the last couple of years she has presented changes in her bowel movements, which she described as  having intermittent diarrhea up to 4 times a day. States that after she had her capsule endoscopy, she noticed worsening diarrhea up to 8 times a day. She has frequent urgency and some episodes of fecal smearing x1. She is currently taking 1-2 tablets of Imodium per day, but is fearful of taking more as this may cause constipation. She describes her Bms as soft but not watery. She felt some nausea yesterday but no vomiting. Patient reports that she has presented intermittent episodes of pain in her epigastric area and the LLQ.  The patient denies having any nausea, vomiting, fever, chills, hematochezia, melena, hematemesis, abdominal distention, abdominal pain,  jaundice, pruritus. She is al most eating as usual, but sometimes her appetite is decreased. She may miss a couple of meals a week. Lost 12 lb recently.  No magnesium supplements or other OTC supplements. Takes omeprazole as needed.  Last EGD: 02/05/21 - candidiasis in esophagus, gastritis Last Colonoscopy: 03/09/2019 - Diverticulosis in the entire examined colon. - Patent end-to-side ileo-colonic anastomosis. - Two small polyps in the proximal ascending colon, removed with a cold snare. Resected and retrieved. - One large polyp in the proximal transverse colon, removed piecemeal using a hot snare. Complete resection via EMR. Polyps were TA.  Past Medical History: Past Medical History:  Diagnosis Date   Acute on chronic respiratory failure with hypoxia ([place]) 10/10/2016   Anxiety    Arthritis    AVM (arteriovenous malformation) of colon    CAD (coronary artery disease)    a. s/p CABG in 2013 b. DES to D1 in 10/2016. c. cath in 07/2018 showing patent grafts with  occlusion of D1 at prior stent site and progression of PDA disease --> medical management recommended   Carotid artery disease ([place])    a. 32-95% LICA, 07/8839    Chronic anemia    Chronic bronchitis (HCC)    Chronic diastolic CHF (congestive heart failure) ([place])    a. 02/2012  Echo EF 60-65%, nl wall motion, Gr 1 DD, mod MR   Colon cancer ([place]) 1992   Esophageal stricture    ESRD on hemodialysis ([place])    ESRD due to HTN, started dialysis 2011 and gets HD at [facility] with Dr Hinda Lenis on MWF schedule.  Access is LUA AVF as of Sept 2014.    GERD (gastroesophageal reflux disease)    High cholesterol 12/2011   History of blood transfusion 07/2011; 12/2011; 01/2012 X 2; 04/2012   History of gout    History of lower GI bleeding    Hypertension    Iron deficiency anemia    Jugular vein occlusion, right (HCC)    Mitral regurgitation    a. Moderate by echo, 02/2012   Myocardial infarction [facility])    NSVT (nonsustained ventricular tachycardia)    Ovarian cancer ([place]) 1992   PAF (paroxysmal atrial fibrillation) ([place])    Pneumonia ~ 2009   PUD (peptic ulcer disease)    TIA (transient ischemic attack)     Past Surgical History: Past Surgical History:  Procedure Laterality Date   A/V SHUNTOGRAM Left 03/19/2019   Procedure: A/V SHUNTOGRAM;  Surgeon: Katha Cabal, MD;  Location: [place] [facility];  Service: Cardiovascular;  Laterality: Left;   ABDOMINAL HYSTERECTOMY  1992   APPENDECTOMY  06/1990   AV FISTULA PLACEMENT  07/2009   left upper arm   AV FISTULA PLACEMENT Right 09/06/2016   Procedure: RIGHT FOREARM ARTERIOVENOUS (AV) GRAFT;  Surgeon: Elam Dutch, MD;  Location: [place];  Service: Vascular;  Laterality: Right;   AV FISTULA PLACEMENT N/A 02/24/2017   Procedure: INSERTION OF ARTERIOVENOUS (AV) GORE-TEX GRAFT ARM (BRACHIAL AXILLARY);  Surgeon: Katha Cabal, MD;  Location: [facility];  Service: Vascular;  Laterality: N/A;   [place] Right 09/06/2016   Procedure: REMOVAL OF Right Arm ARTERIOVENOUS GORETEX GRAFT and Vein Patch angioplasty of brachial artery;  Surgeon: Angelia Mould, MD;  Location: [place];  Service: Vascular;  Laterality: Right;   BIOPSY  09/26/2019   Procedure: BIOPSY;  Surgeon: Rogene Houston, MD;  [facility];   Service: Endoscopy;;   COLON RESECTION  1992   COLON SURGERY     COLONOSCOPY N/A 03/09/2019   Procedure: COLONOSCOPY;  Surgeon: Rogene Houston, MD;  [facility];  Service: Endoscopy;  Laterality: N/A;   CORONARY ANGIOPLASTY WITH STENT PLACEMENT  12/15/11   "2"   CORONARY ANGIOPLASTY WITH STENT PLACEMENT  y/2013   "1; makes total of 3" (05/02/2012)   CORONARY ARTERY BYPASS GRAFT  06/13/2012   Procedure: CORONARY ARTERY BYPASS GRAFTING (CABG);  Surgeon: Grace Isaac, MD;  Location: [place];  Service: Open Heart Surgery;  Laterality: N/A;  cabg x four;  using left internal mammary artery, and left leg greater saphenous vein harvested endoscopically   CORONARY STENT INTERVENTION N/A 10/13/2016   Procedure: Coronary Stent Intervention;  Surgeon: Troy Sine, MD;  Location: [place] [facility];  Service: Cardiovascular;  Laterality: N/A;   DIALYSIS/PERMA CATHETER REMOVAL N/A 04/18/2017   Procedure: DIALYSIS/PERMA CATHETER REMOVAL;  Surgeon: Katha Cabal, MD;  Location: [place] [facility];  Service: Cardiovascular;  Laterality: N/A;   DILATION AND CURETTAGE OF UTERUS     ESOPHAGOGASTRODUODENOSCOPY  01/20/2012   Procedure: ESOPHAGOGASTRODUODENOSCOPY (EGD);  Surgeon: Ladene Artist, MD,FACG;  Location: [facility] ENDOSCOPY;  Service: Endoscopy;  Laterality: N/A;   ESOPHAGOGASTRODUODENOSCOPY N/A 03/26/2013   Procedure: ESOPHAGOGASTRODUODENOSCOPY (EGD);  Surgeon: Irene Shipper, MD;  Location: [facility] ENDOSCOPY;  Service: Endoscopy;  Laterality: N/A;   ESOPHAGOGASTRODUODENOSCOPY N/A 04/30/2015   Procedure: ESOPHAGOGASTRODUODENOSCOPY (EGD);  Surgeon: Rogene Houston, MD;  [facility];  Service: Endoscopy;  Laterality: N/A;  1pm - moved to 10/20 @ 1:10   ESOPHAGOGASTRODUODENOSCOPY N/A 07/29/2016   Procedure: ESOPHAGOGASTRODUODENOSCOPY (EGD);  Surgeon: Manus Gunning, MD;  Location: [place];  Service: Gastroenterology;  Laterality: N/A;  enteroscopy   ESOPHAGOGASTRODUODENOSCOPY  N/A 09/26/2019   Procedure: ESOPHAGOGASTRODUODENOSCOPY (EGD);  Surgeon: Rogene Houston, MD;  [facility];  Service: Endoscopy;  Laterality: N/A;  1250   ESOPHAGOGASTRODUODENOSCOPY (EGD) WITH PROPOFOL N/A 02/05/2021   Procedure: ESOPHAGOGASTRODUODENOSCOPY (EGD) WITH PROPOFOL;  Surgeon: Eloise Harman, DO;  [facility];  Service: Endoscopy;  Laterality: N/A;   GIVENS CAPSULE STUDY N/A 03/07/2019   Procedure: GIVENS CAPSULE STUDY;  Surgeon: Rogene Houston, MD;  [facility];  Service: Endoscopy;  Laterality: N/A;  7:30   GIVENS CAPSULE STUDY N/A 04/22/2021   Procedure: GIVENS CAPSULE STUDY;  Surgeon: Rogene Houston, MD;  [facility];  Service: Endoscopy;  Laterality: N/A;  7:30   INSERTION OF DIALYSIS CATHETER N/A 10/05/2020   Procedure: ABORTED TUNNELED DIALYSIS CATHETER PLACEMENT RIGHT INTERNAL JUGULAR VEIN ;  Surgeon: Virl Cagey, MD;  [facility];  Service: General;  Laterality: N/A;   INTRAOPERATIVE TRANSESOPHAGEAL ECHOCARDIOGRAM  06/13/2012   Procedure: INTRAOPERATIVE TRANSESOPHAGEAL ECHOCARDIOGRAM;  Surgeon: Grace Isaac, MD;  Location: [place];  Service: Open Heart Surgery;  Laterality: N/A;   IR DIALY SHUNT INTRO NEEDLE/INTRACATH INITIAL W/IMG LEFT Left 10/06/2020   IR FLUORO GUIDE CV LINE RIGHT  06/17/2020   IR GENERIC HISTORICAL  07/26/2016   IR FLUORO GUIDE CV LINE RIGHT 07/26/2016 Greggory Keen, MD [facility]   IR GENERIC HISTORICAL  07/26/2016   IR US GUIDE VASC ACCESS RIGHT 07/26/2016 Greggory Keen, MD [facility]   IR GENERIC HISTORICAL  08/02/2016   IR US GUIDE VASC ACCESS RIGHT 08/02/2016 Greggory Keen, MD [facility]   IR GENERIC HISTORICAL  08/02/2016   IR FLUORO GUIDE CV LINE RIGHT 08/02/2016 Greggory Keen, MD [facility]   IR RADIOLOGY PERIPHERAL GUIDED IV START  03/28/2017   IR REMOVAL TUN CV CATH W/O FL  08/11/2020   IR THROMBECTOMY AV FISTULA W/THROMBOLYSIS INC/SHUNT/IMG LEFT Left 06/17/2020   IR US GUIDE VASC  ACCESS LEFT  06/17/2020   IR US GUIDE VASC ACCESS RIGHT  03/28/2017   IR US GUIDE VASC ACCESS RIGHT  06/17/2020   LEFT HEART CATH AND CORONARY ANGIOGRAPHY N/A 09/20/2016   Procedure: Left Heart Cath and Coronary Angiography;  Surgeon: [name], MD;  Location: [place] [facility];  Service: Cardiovascular;  Laterality: N/A;   LEFT HEART CATH AND CORS/GRAFTS ANGIOGRAPHY N/A 10/13/2016   Procedure: Left Heart Cath and Cors/Grafts Angiography;  Surgeon: Troy Sine, MD;  Location: [place] [facility];  Service: Cardiovascular;  Laterality: N/A;   LEFT HEART CATH AND CORS/GRAFTS ANGIOGRAPHY N/A 07/13/2018   Procedure: LEFT HEART CATH AND CORS/GRAFTS ANGIOGRAPHY;  Surgeon: Martinique, Peter M, MD;  Location: [place] [facility];  Service: Cardiovascular;  Laterality: N/A;   LEFT HEART CATHETERIZATION WITH CORONARY ANGIOGRAM N/A 12/15/2011   Procedure: LEFT HEART CATHETERIZATION WITH CORONARY ANGIOGRAM;  Surgeon: Burnell Blanks, MD;  Location: [facility] CATH LAB;  Service: Cardiovascular;  Laterality: N/A;   LEFT HEART CATHETERIZATION WITH CORONARY ANGIOGRAM N/A 01/10/2012   Procedure: LEFT HEART CATHETERIZATION WITH CORONARY ANGIOGRAM;  Surgeon: Peter M Martinique, MD;  Location: [facility] CATH LAB;  Service: Cardiovascular;  Laterality: N/A;   LEFT HEART CATHETERIZATION WITH CORONARY ANGIOGRAM N/A 06/08/2012   Procedure: LEFT HEART CATHETERIZATION WITH CORONARY ANGIOGRAM;  Surgeon: Burnell Blanks, MD;  Location: [facility] CATH LAB;  Service: Cardiovascular;  Laterality: N/A;   LEFT HEART CATHETERIZATION WITH CORONARY/GRAFT ANGIOGRAM N/A 12/10/2013   Procedure: LEFT HEART CATHETERIZATION WITH Beatrix Fetters;  Surgeon: [name], MD;  Location: [facility] CATH LAB;  Service: Cardiovascular;  Laterality: N/A;   OVARY SURGERY     ovarian cancer   POLYPECTOMY  03/09/2019   Procedure: POLYPECTOMY;  Surgeon: Rogene Houston, MD;  [facility];  Service: Endoscopy;;  cecal    POLYPECTOMY N/A 09/26/2019    Procedure: DUODENAL POLYPECTOMY;  Surgeon: Rogene Houston, MD;  [facility];  Service: Endoscopy;  Laterality: N/A;   REVISION OF ARTERIOVENOUS GORETEX GRAFT N/A 02/24/2017   Procedure: REVISION OF ARTERIOVENOUS GORETEX GRAFT (RESECTION);  Surgeon: Katha Cabal, MD;  Location: [facility];  Service: Vascular;  Laterality: N/A;   REVISON OF ARTERIOVENOUS FISTULA Left 06/19/2020   Procedure: REVISION OF LEFT UPPER ARM AV GRAFT WITH INTERPOSITION JUMP GRAFT USING 6MM GORE LIMB;  Surgeon: Marty Heck, MD;  Location: [place];  Service: Vascular;  Laterality: Left;   SHUNTOGRAM N/A 10/15/2013   Procedure: Fistulogram;  Surgeon: Serafina Mitchell, MD;  Location: [facility] CATH LAB;  Service: Cardiovascular;  Laterality: N/A;   THROMBECTOMY / ARTERIOVENOUS GRAFT REVISION  2011   left upper arm   TUBAL LIGATION  1980's   UPPER EXTREMITY ANGIOGRAPHY Bilateral 12/06/2016   Procedure: Upper Extremity Angiography;  Surgeon: Katha Cabal, MD;  Location: [place] [facility];  Service: Cardiovascular;  Laterality: Bilateral;   UPPER EXTREMITY INTERVENTION Left 06/06/2017   Procedure: UPPER EXTREMITY INTERVENTION;  Surgeon: Katha Cabal, MD;  Location: [place] [facility];  Service: Cardiovascular;  Laterality: Left;    Family History: Family History  Problem Relation Age of Onset   Heart disease Mother        Heart Disease before age 61   Hyperlipidemia Mother    Hypertension Mother    Diabetes Mother    Heart attack Mother    Heart disease Father        Heart Disease before age 54   Hyperlipidemia Father    Hypertension Father    Diabetes Father    Diabetes Sister    Hypertension Sister    Diabetes Brother    Hyperlipidemia Brother    Heart attack Brother    Hypertension Sister    Heart attack Brother    Colon cancer Child 71   Other Other        noncontributory for early CAD   Esophageal cancer Neg Hx    Liver disease Neg Hx    Kidney disease Neg Hx    Colon  polyps Neg Hx     Social History: Social History   Tobacco Use  Smoking Status Never  Smokeless Tobacco Never   Social History   Substance and Sexual Activity  Alcohol Use No   Alcohol/week:  0.0 standard drinks   Social History   Substance and Sexual Activity  Drug Use No    Allergies: Allergies  Allergen Reactions   Amlodipine Swelling   Aspirin Other (See Comments)    High Doses Mess up her stomach; "makes my bowels have blood in them". Takes 81 mg EC Aspirin    Nitrofurantoin Hives   Penicillins Other (See Comments)    SYNCOPE? , "makes me real weak when I take it; like I'll pass out"  Has patient had a PCN reaction causing immediate rash, facial/tongue/throat swelling, SOB or lightheadedness with hypotension: Yes Has patient had a PCN reaction causing severe rash involving mucus membranes or skin necrosis: no Has patient had a PCN reaction that required hospitalization no Has patient had a PCN reaction occurring within the last 10 years: no If all of the above Other reaction(s): Other (See Comments) SYNCOPE? , "makes me real weak when I take it; like I'll pass out"  Has patient had a PCN reaction causing immediate rash, facial/tongue/throat swelling, SOB or lightheadedness with hypotension: Yes Has patient had a PCN reaction causing severe rash involving mucus membranes or skin necrosis: no Has patient had a PCN reaction that required hospitalization no Has patient had a PCN reaction occurring within the last 10 years: no If all of the above I FELT LIKE I WAS GOING TO PASS OUT   Bactrim [Sulfamethoxazole-Trimethoprim] Rash   Contrast Media [Iodinated Diagnostic Agents] Itching   Iron Itching and Other (See Comments)    "they gave me iron in dialysis; had to give me Benadryl cause I had to have the iron" (05/02/2012)   Tylenol [Acetaminophen] Itching and Other (See Comments)    Makes her feet on fire per pt   Gabapentin Other (See Comments)    Unknown reaction    Iron Sucrose    No Known Allergies    Ranexa [Ranolazine] Other (See Comments)    Myoclonus-hospitalized    Sucroferric Oxyhydroxide    Clopidogrel Rash   Dexilant [Dexlansoprazole] Other (See Comments)    Upset stomach   Hydralazine Itching    Has tolerated while inpatient Has tolerated while inpatient   Levaquin [Levofloxacin In D5w] Rash   Levofloxacin Rash   Morphine Itching   Morphine And Related Itching and Other (See Comments)    Itching in feet   Pantoprazole Rash   Plavix [Clopidogrel Bisulfate] Rash   Protonix [Pantoprazole Sodium] Rash   Venofer [Ferric Oxide] Itching and Other (See Comments)    Patient reports using Benadryl prior to doses as [facility]    Medications: Current Outpatient Medications  Medication Sig Dispense Refill   ALPRAZolam (XANAX) 0.5 MG tablet Take 0.5 mg by mouth daily as needed.     amiodarone (PACERONE) 200 MG tablet Take 1 tablet (200 mg total) by mouth daily. 90 tablet 2   aspirin EC 81 MG tablet Take 81 mg by mouth daily.      diclofenac Sodium (VOLTAREN) 1 % GEL Apply 2 g topically 4 (four) times daily as needed. (Patient taking differently: Apply 2 g topically 4 (four) times daily as needed (pain).) 100 g 0   fluticasone (FLONASE) 50 MCG/ACT nasal spray Place 1 spray into both nostrils at bedtime as needed for allergies.      hydrOXYzine (VISTARIL) 100 MG capsule Take 100 mg by mouth daily as needed for anxiety or itching.     isosorbide mononitrate (IMDUR) 60 MG 24 hr tablet Take 1 tablet (60 mg total) by  mouth in the morning and at bedtime. 180 tablet 3   levothyroxine (SYNTHROID) 25 MCG tablet Take 1 tablet (25 mcg total) by mouth daily. 90 tablet 0   lidocaine-prilocaine (EMLA) cream Apply 1 application topically every Monday, Wednesday, and Friday. Prior to dialysis     loperamide (IMODIUM A-D) 2 MG tablet Take 2 tablets (4 mg total) by mouth daily as needed for diarrhea or loose stools.     midodrine (PROAMATINE) 5 MG tablet  Take Monday, Wednesday, Friday morning BEFORE dialysis 48 tablet 6   multivitamin (RENA-VIT) TABS tablet Take 1 tablet by mouth daily.     nitroGLYCERIN (NITROSTAT) 0.4 MG SL tablet Place 1 tablet (0.4 mg total) under the tongue every 5 (five) minutes x 3 doses as needed for chest pain (if no relief after 2nd dose, proceed to the ED for an evaluation or call 911). 25 tablet 3   omeprazole (PRILOSEC) 20 MG capsule Take 1 capsule (20 mg total) by mouth daily. 90 capsule 3   rosuvastatin (CRESTOR) 10 MG tablet Take 1 tablet (10 mg total) by mouth daily. 90 tablet 3   sevelamer carbonate (RENVELA) 800 MG tablet Take 2 tablets (1,600 mg total) by mouth 3 (three) times daily with meals. 180 tablet 1   metoprolol tartrate (LOPRESSOR) 50 MG tablet Take 1 tablet (50 mg total) by mouth 2 (two) times daily. (Patient not taking: Reported on 05/20/2021) 180 tablet 1   No current facility-administered medications for this visit.    Review of Systems: GENERAL: negative for malaise, night sweats HEENT: No changes in hearing or vision, no nose bleeds or other nasal problems. NECK: Negative for lumps, goiter, pain and significant neck swelling RESPIRATORY: Negative for cough, wheezing CARDIOVASCULAR: Negative for chest pain, leg swelling, palpitations, orthopnea GI: SEE HPI MUSCULOSKELETAL: Negative for joint pain or swelling, back pain, and muscle pain. SKIN: Negative for lesions, rash PSYCH: Negative for sleep disturbance, mood disorder and recent psychosocial stressors. HEMATOLOGY Negative for prolonged bleeding, bruising easily, and swollen nodes. ENDOCRINE: Negative for cold or heat intolerance, polyuria, polydipsia and goiter. NEURO: negative for tremor, gait imbalance, syncope and seizures. The remainder of the review of systems is noncontributory.   Physical Exam: BP (!) 134/48 (BP Location: Left Arm, Patient Position: Sitting, Cuff Size: Large)   Pulse (!) 58   Temp 97.7 F (36.5 C) (Oral)    Ht 5\' 1"  (1.549 m)   Wt 120 lb 3.2 oz (54.5 kg)   BMI 22.71 kg/m  GENERAL: The patient is AO x3, in no acute distress. HEENT: Head is normocephalic and atraumatic. EOMI are intact. Mouth is well hydrated and without lesions. NECK: Supple. No masses LUNGS: Clear to auscultation. No presence of rhonchi/wheezing/rales. Adequate chest expansion HEART: RRR, normal s1 and s2. ABDOMEN: Soft, nontender, no guarding, no peritoneal signs, and nondistended. BS +. No masses. EXTREMITIES: Without any cyanosis, clubbing, rash, lesions or edema. NEUROLOGIC: AOx3, no focal motor deficit. SKIN: no jaundice, no rashes  Imaging/Labs: as above  I personally reviewed and interpreted the available labs, imaging and endoscopic files.  Impression and Plan: Shawna [name] is a 80 y.o. female with with PMH ESRD on dyalisis, congestive heart failure, history of colon cancer, TIA, PAF, history of AVMs, HLD, HTN, CAD status post CABG and stent placement, duodenal and jejunal polyps, iron deficiency anemia, who presents for follow evaluation of changes in bowel movements.  Patient has presented a chronic history of diarrhea unclear etiology.  She has also presented some abdominal  complaints in the past.  She has not presented any new findings in CT of the abdomen and pelvis she had in 2021 we will explain the diarrhea.  We will perform stool studies and check CBC, CMP and TSH.  I advised her to continue with Imodium and can uptitrate the use of this medication to improve her symptoms.  If this does not lead to improvement of symptoms, we can consider trying a bile salt binder.  At this point, we will reorder the push enteroscopy and colonoscopy for management of the duodenal and jejunal polyps.  I discussed with the patient that it may be possible that these lesions may not be reached or resected during this procedure.  She understood and agreed.  We will also obtain random colonic biopsies at that time.  - Schedule push  enteroscopy and colonoscopy with random colonic biopsies - Check CBC, CMP, iron studies, TSH, C. Diff and GI pathogen panel -Continue Imodium as needed for diarrhea  All questions were answered.      Harvel Quale, MD Gastroenterology and Hepatology [facility]

## 2021-05-20 NOTE — Patient Instructions (Signed)
Schedule push enteroscopy and colonoscopy with random colonic biopsies Perform blood workup Continue Imodium as needed for diarrhea - can take up to 4 tablets every day

## 2021-05-21 NOTE — Telephone Encounter (Signed)
LeighAnn Valleri Hendricksen, CMA  

## 2021-05-27 ENCOUNTER — Ambulatory Visit (INDEPENDENT_AMBULATORY_CARE_PROVIDER_SITE_OTHER): Payer: Medicare HMO | Admitting: Gastroenterology

## 2021-06-01 ENCOUNTER — Encounter (HOSPITAL_COMMUNITY)
Admission: RE | Admit: 2021-06-01 | Discharge: 2021-06-01 | Disposition: A | Discharge status: Home / Self Care | Payer: Medicare HMO | Source: Ambulatory Visit | Attending: Gastroenterology | Admitting: Gastroenterology

## 2021-06-01 ENCOUNTER — Other Ambulatory Visit: Payer: Self-pay

## 2021-06-08 ENCOUNTER — Ambulatory Visit (HOSPITAL_COMMUNITY): Payer: Medicare HMO | Admitting: Anesthesiology

## 2021-06-08 ENCOUNTER — Encounter (HOSPITAL_COMMUNITY): Payer: Self-pay | Admitting: Gastroenterology

## 2021-06-08 ENCOUNTER — Encounter (HOSPITAL_COMMUNITY)
Admission: RE | Disposition: A | Discharge status: Home / Self Care | Payer: Self-pay | Source: Home / Self Care | Attending: Gastroenterology

## 2021-06-08 ENCOUNTER — Ambulatory Visit (HOSPITAL_COMMUNITY)
Admission: RE | Admit: 2021-06-08 | Discharge: 2021-06-08 | Disposition: A | Discharge status: Home / Self Care | Payer: Medicare HMO | Attending: Gastroenterology | Admitting: Gastroenterology

## 2021-06-08 DIAGNOSIS — E782 Mixed hyperlipidemia: Secondary | ICD-10-CM | POA: Insufficient documentation

## 2021-06-08 DIAGNOSIS — Z8673 Personal history of transient ischemic attack (TIA), and cerebral infarction without residual deficits: Secondary | ICD-10-CM | POA: Insufficient documentation

## 2021-06-08 DIAGNOSIS — Z955 Presence of coronary angioplasty implant and graft: Secondary | ICD-10-CM | POA: Diagnosis not present

## 2021-06-08 DIAGNOSIS — Z8719 Personal history of other diseases of the digestive system: Secondary | ICD-10-CM | POA: Diagnosis not present

## 2021-06-08 DIAGNOSIS — I5032 Chronic diastolic (congestive) heart failure: Secondary | ICD-10-CM | POA: Diagnosis not present

## 2021-06-08 DIAGNOSIS — G709 Myoneural disorder, unspecified: Secondary | ICD-10-CM | POA: Diagnosis not present

## 2021-06-08 DIAGNOSIS — F419 Anxiety disorder, unspecified: Secondary | ICD-10-CM | POA: Diagnosis not present

## 2021-06-08 DIAGNOSIS — Z992 Dependence on renal dialysis: Secondary | ICD-10-CM | POA: Insufficient documentation

## 2021-06-08 DIAGNOSIS — Z5309 Procedure and treatment not carried out because of other contraindication: Secondary | ICD-10-CM | POA: Insufficient documentation

## 2021-06-08 DIAGNOSIS — Z951 Presence of aortocoronary bypass graft: Secondary | ICD-10-CM | POA: Diagnosis not present

## 2021-06-08 DIAGNOSIS — Y848 Other medical procedures as the cause of abnormal reaction of the patient, or of later complication, without mention of misadventure at the time of the procedure: Secondary | ICD-10-CM | POA: Diagnosis not present

## 2021-06-08 DIAGNOSIS — N186 End stage renal disease: Secondary | ICD-10-CM | POA: Diagnosis not present

## 2021-06-08 DIAGNOSIS — R194 Change in bowel habit: Secondary | ICD-10-CM | POA: Insufficient documentation

## 2021-06-08 DIAGNOSIS — Z86010 Personal history of colonic polyps: Secondary | ICD-10-CM | POA: Diagnosis not present

## 2021-06-08 DIAGNOSIS — I480 Paroxysmal atrial fibrillation: Secondary | ICD-10-CM | POA: Insufficient documentation

## 2021-06-08 DIAGNOSIS — M1990 Unspecified osteoarthritis, unspecified site: Secondary | ICD-10-CM | POA: Diagnosis not present

## 2021-06-08 DIAGNOSIS — Z85038 Personal history of other malignant neoplasm of large intestine: Secondary | ICD-10-CM | POA: Insufficient documentation

## 2021-06-08 DIAGNOSIS — E119 Type 2 diabetes mellitus without complications: Secondary | ICD-10-CM | POA: Diagnosis not present

## 2021-06-08 DIAGNOSIS — I2510 Atherosclerotic heart disease of native coronary artery without angina pectoris: Secondary | ICD-10-CM | POA: Insufficient documentation

## 2021-06-08 DIAGNOSIS — E039 Hypothyroidism, unspecified: Secondary | ICD-10-CM | POA: Diagnosis not present

## 2021-06-08 DIAGNOSIS — K219 Gastro-esophageal reflux disease without esophagitis: Secondary | ICD-10-CM | POA: Diagnosis not present

## 2021-06-08 DIAGNOSIS — K529 Noninfective gastroenteritis and colitis, unspecified: Secondary | ICD-10-CM

## 2021-06-08 DIAGNOSIS — I252 Old myocardial infarction: Secondary | ICD-10-CM | POA: Insufficient documentation

## 2021-06-08 DIAGNOSIS — Z8711 Personal history of peptic ulcer disease: Secondary | ICD-10-CM | POA: Insufficient documentation

## 2021-06-08 DIAGNOSIS — T8089XA Other complications following infusion, transfusion and therapeutic injection, initial encounter: Secondary | ICD-10-CM | POA: Insufficient documentation

## 2021-06-08 DIAGNOSIS — D509 Iron deficiency anemia, unspecified: Secondary | ICD-10-CM | POA: Insufficient documentation

## 2021-06-08 DIAGNOSIS — D132 Benign neoplasm of duodenum: Secondary | ICD-10-CM

## 2021-06-08 DIAGNOSIS — I132 Hypertensive heart and chronic kidney disease with heart failure and with stage 5 chronic kidney disease, or end stage renal disease: Secondary | ICD-10-CM | POA: Insufficient documentation

## 2021-06-08 HISTORY — PX: COLONOSCOPY WITH PROPOFOL: SHX5780

## 2021-06-08 HISTORY — PX: ENTEROSCOPY: SHX5533

## 2021-06-08 LAB — POCT I-STAT, CHEM 8
BUN: 34 mg/dL — ABNORMAL HIGH (ref 8–23)
Calcium, Ion: 1.11 mmol/L — ABNORMAL LOW (ref 1.15–1.40)
Chloride: 96 mmol/L — ABNORMAL LOW (ref 98–111)
Creatinine, Ser: 6.1 mg/dL — ABNORMAL HIGH (ref 0.44–1.00)
Glucose, Bld: 95 mg/dL (ref 70–99)
HCT: 28 % — ABNORMAL LOW (ref 36.0–46.0)
Hemoglobin: 9.5 g/dL — ABNORMAL LOW (ref 12.0–15.0)
Potassium: 2.8 mmol/L — ABNORMAL LOW (ref 3.5–5.1)
Sodium: 141 mmol/L (ref 135–145)
TCO2: 35 mmol/L — ABNORMAL HIGH (ref 22–32)

## 2021-06-08 SURGERY — COLONOSCOPY WITH PROPOFOL
Anesthesia: General

## 2021-06-08 MED ORDER — PROPOFOL 10 MG/ML IV BOLUS
INTRAVENOUS | Status: DC | PRN
  Administered 2021-06-08: 70 mg via INTRAVENOUS

## 2021-06-08 MED ORDER — LACTATED RINGERS IV SOLN: INTRAVENOUS | Status: DC

## 2021-06-08 MED ORDER — SODIUM CHLORIDE 0.9 % IV SOLN: INTRAVENOUS | Status: DC | PRN

## 2021-06-08 MED ORDER — SODIUM CHLORIDE 0.9 % IV SOLN: Freq: Once | INTRAVENOUS | Status: AC

## 2021-06-08 NOTE — Progress Notes (Signed)
IV infiltrated prior to beginning IV sedation.  Multiple unsuccessful attempt by Dr. Briant Cedar and Drucie Opitz, CRNA.  Procedures cancelled per Dr. Jenetta Downer. Pt verbalized understanding.Transported via stretcher to short stay phase 2.

## 2021-06-08 NOTE — Anesthesia Postprocedure Evaluation (Signed)
Anesthesia Post Note  Patient: Shawna [name]  Procedure(s) Performed: COLONOSCOPY WITH PROPOFOL PUSH ENTEROSCOPY  Patient location during evaluation: Phase II Anesthesia Type: General Level of consciousness: awake Pain management: pain level controlled Vital Signs Assessment: post-procedure vital signs reviewed and stable Respiratory status: spontaneous breathing and respiratory function stable Cardiovascular status: blood pressure returned to baseline and stable Postop Assessment: no headache and no apparent nausea or vomiting Anesthetic complications: no Comments: Late entry   No notable events documented.   Last Vitals:  Vitals:   06/08/21 1100 06/08/21 1111  BP: (!) 175/42 (!) 167/49  Pulse: (!) 58   Resp: 18   Temp: 36.5 C   SpO2: 100%     Last Pain:  Vitals:   06/08/21 1100  TempSrc: Oral  PainSc: 0-No pain                 [name]

## 2021-06-08 NOTE — Anesthesia Preprocedure Evaluation (Signed)
Anesthesia Evaluation  Patient identified by MRN, date of birth, ID band Patient awake    Reviewed: Allergy & Precautions, H&P , NPO status , Patient's Chart, lab work & pertinent test results, reviewed documented beta blocker date and time   Airway Mallampati: II  TM Distance: >3 FB Neck ROM: full    Dental no notable dental hx.    Pulmonary pneumonia,    Pulmonary exam normal breath sounds clear to auscultation       Cardiovascular Exercise Tolerance: Good hypertension, + angina + CAD, + Past MI, +CHF and + DOE   Rhythm:regular Rate:Normal     Neuro/Psych PSYCHIATRIC DISORDERS Anxiety TIA Neuromuscular disease    GI/Hepatic Neg liver ROS, PUD, GERD  Medicated,  Endo/Other  diabetes, Type 2Hypothyroidism   Renal/GU ESRF and DialysisRenal disease  negative genitourinary   Musculoskeletal  (+) Arthritis ,   Abdominal   Peds  Hematology  (+) Blood dyscrasia, anemia ,   Anesthesia Other Findings   Reproductive/Obstetrics negative OB ROS                             Anesthesia Physical  Anesthesia Plan  ASA: 4  Anesthesia Plan: General   Post-op Pain Management:    Induction:   PONV Risk Score and Plan: Propofol infusion  Airway Management Planned:   Additional Equipment:   Intra-op Plan:   Post-operative Plan:   Informed Consent: I have reviewed the patients History and Physical, chart, labs and discussed the procedure including the risks, benefits and alternatives for the proposed anesthesia with the patient or authorized representative who has indicated his/her understanding and acceptance.     Dental Advisory Given  Plan Discussed with: CRNA  Anesthesia Plan Comments:         Anesthesia Quick Evaluation

## 2021-06-08 NOTE — Transfer of Care (Signed)
Immediate Anesthesia Transfer of Care Note  Patient: Shawna [name]  Procedure(s) Performed: COLONOSCOPY WITH PROPOFOL PUSH ENTEROSCOPY  Patient Location: Short Stay  Anesthesia Type:General  Level of Consciousness: awake and patient cooperative  Airway & Oxygen Therapy: Patient Spontanous Breathing  Post-op Assessment: Report given to RN and Post -op Vital signs reviewed and stable  Post vital signs: Reviewed and stable  Last Vitals:  Vitals Value Taken Time  BP    Temp    Pulse    Resp    SpO2      Last Pain:  Vitals:   06/08/21 1004  TempSrc:   PainSc: 0-No pain      Patients Stated Pain Goal: 5 (88/32/54 9826)  Complications: IV infiltrated in procedure room. Unable to regain IV access. Case cancelled

## 2021-06-08 NOTE — Progress Notes (Signed)
Case was canceled as IV got infiltrated.  Multiple attempts by anesthesia and staff were performed to obtain access but the patient had very poor vasculature and the left arm could not be used due to her AV fistula.  Procedure was canceled and will need to discuss with interventional radiology, nephrology and vascular surgery if PICC line can be placed prior to repeating push enteroscopy and colonoscopy.  Patient understood and agreed.

## 2021-06-08 NOTE — Anesthesia Procedure Notes (Signed)
Date/Time: 06/08/2021 10:02 AM Performed by: Vista Deck, CRNA Pre-anesthesia Checklist: Patient identified, Emergency Drugs available, Suction available, Timeout performed and Patient being monitored Patient Re-evaluated:Patient Re-evaluated prior to induction Oxygen Delivery Method: Nasal Cannula

## 2021-06-08 NOTE — Interval H&P Note (Signed)
History and Physical Interval Note:  06/08/2021 9:01 AM  Shawna [name] [name]  has presented today for surgery, with the diagnosis of Small Bowel Polyps diarrhea.  The various methods of treatment have been discussed with the patient and family. After consideration of risks, benefits and other options for treatment, the patient has consented to  Procedure(s) with comments: COLONOSCOPY WITH PROPOFOL (N/A) - 9:05 /Patient is on dialysis Mon Wed Fri PUSH ENTEROSCOPY (N/A) as a surgical intervention.  The patient's history has been reviewed, patient examined, no change in status, stable for surgery.  I have reviewed the patient's chart and labs.  Questions were answered to the patient's satisfaction.     [name] Mayorga

## 2021-06-10 ENCOUNTER — Encounter (HOSPITAL_COMMUNITY): Payer: Self-pay | Admitting: Gastroenterology

## 2021-06-10 ENCOUNTER — Other Ambulatory Visit (INDEPENDENT_AMBULATORY_CARE_PROVIDER_SITE_OTHER): Payer: Self-pay

## 2021-06-10 DIAGNOSIS — Z86010 Personal history of colonic polyps: Secondary | ICD-10-CM

## 2021-06-11 ENCOUNTER — Encounter (INDEPENDENT_AMBULATORY_CARE_PROVIDER_SITE_OTHER): Payer: Self-pay

## 2021-06-11 ENCOUNTER — Other Ambulatory Visit (HOSPITAL_COMMUNITY): Payer: Self-pay | Admitting: Interventional Radiology

## 2021-06-11 ENCOUNTER — Telehealth (INDEPENDENT_AMBULATORY_CARE_PROVIDER_SITE_OTHER): Payer: Self-pay

## 2021-06-11 MED ORDER — NA SULFATE-K SULFATE-MG SULF 17.5-3.13-1.6 GM/177ML PO SOLN: 1.0000 | ORAL | 0 refills | Status: DC

## 2021-06-11 NOTE — Telephone Encounter (Signed)
Shawna [name], CMA  

## 2021-06-14 ENCOUNTER — Ambulatory Visit (INDEPENDENT_AMBULATORY_CARE_PROVIDER_SITE_OTHER): Payer: Medicare HMO | Admitting: Gastroenterology

## 2021-06-16 ENCOUNTER — Telehealth (INDEPENDENT_AMBULATORY_CARE_PROVIDER_SITE_OTHER): Payer: Self-pay | Admitting: *Deleted

## 2021-06-16 NOTE — Telephone Encounter (Signed)
She can take up to 6 tablets of Imodium per day, if still presenting diarrhea with this we may add a secon agent. The colonoscopy will help [place] determine if there is any other reason for her diarrhea as she has had multiple tests that were unremarkable

## 2021-06-16 NOTE — Telephone Encounter (Signed)
See other message from today ( 06/16/21)

## 2021-06-16 NOTE — Telephone Encounter (Signed)
error 

## 2021-06-16 NOTE — Telephone Encounter (Signed)
Pt called and states yesterday she had 5 -6 episodes of diarrhea. Took imodium 2mg  - 2 tablets last night, 2 tablets this morning, and 2 before going to dialysis and none today but wondering if she could take something else instead of taking that much imodium. She took more because she knew she could not be going that much while at dialysis. Last seen 05/20/21. Colonoscopy was scheduled last week but pt states unable to do because they could not put in an IV after sticking her 7 times. Colonscopy rescheduled for jan 10th.   [phone]

## 2021-06-16 NOTE — Telephone Encounter (Signed)
Called and discussed with pt and she verbalized understanding.

## 2021-06-16 NOTE — Telephone Encounter (Signed)
Pt called and left voicemail. I could not make out her message on the vm. She did not leave the complete phone number. I only got [phone]. I called number in chart and was told she is at diayslis and she would be back about 4:30 or 5 today.

## 2021-06-17 ENCOUNTER — Encounter (HOSPITAL_COMMUNITY): Payer: Self-pay

## 2021-06-17 ENCOUNTER — Other Ambulatory Visit (INDEPENDENT_AMBULATORY_CARE_PROVIDER_SITE_OTHER): Payer: Self-pay

## 2021-06-17 ENCOUNTER — Emergency Department (HOSPITAL_COMMUNITY): Payer: Medicare HMO

## 2021-06-17 ENCOUNTER — Emergency Department (HOSPITAL_COMMUNITY)
Admission: EM | Admit: 2021-06-17 | Discharge: 2021-06-18 | Disposition: A | Discharge status: Home / Self Care | Payer: Medicare HMO | Attending: Emergency Medicine | Admitting: Emergency Medicine

## 2021-06-17 ENCOUNTER — Other Ambulatory Visit: Payer: Self-pay

## 2021-06-17 DIAGNOSIS — Z79899 Other long term (current) drug therapy: Secondary | ICD-10-CM | POA: Diagnosis not present

## 2021-06-17 DIAGNOSIS — R0789 Other chest pain: Secondary | ICD-10-CM | POA: Diagnosis present

## 2021-06-17 DIAGNOSIS — Z7982 Long term (current) use of aspirin: Secondary | ICD-10-CM | POA: Insufficient documentation

## 2021-06-17 DIAGNOSIS — N186 End stage renal disease: Secondary | ICD-10-CM | POA: Diagnosis not present

## 2021-06-17 DIAGNOSIS — I5032 Chronic diastolic (congestive) heart failure: Secondary | ICD-10-CM | POA: Insufficient documentation

## 2021-06-17 DIAGNOSIS — Z8616 Personal history of COVID-19: Secondary | ICD-10-CM | POA: Insufficient documentation

## 2021-06-17 DIAGNOSIS — I132 Hypertensive heart and chronic kidney disease with heart failure and with stage 5 chronic kidney disease, or end stage renal disease: Secondary | ICD-10-CM | POA: Insufficient documentation

## 2021-06-17 DIAGNOSIS — E039 Hypothyroidism, unspecified: Secondary | ICD-10-CM | POA: Diagnosis not present

## 2021-06-17 DIAGNOSIS — Z992 Dependence on renal dialysis: Secondary | ICD-10-CM | POA: Insufficient documentation

## 2021-06-17 DIAGNOSIS — E1122 Type 2 diabetes mellitus with diabetic chronic kidney disease: Secondary | ICD-10-CM | POA: Insufficient documentation

## 2021-06-17 DIAGNOSIS — Z8543 Personal history of malignant neoplasm of ovary: Secondary | ICD-10-CM | POA: Insufficient documentation

## 2021-06-17 DIAGNOSIS — I2510 Atherosclerotic heart disease of native coronary artery without angina pectoris: Secondary | ICD-10-CM | POA: Diagnosis not present

## 2021-06-17 DIAGNOSIS — R079 Chest pain, unspecified: Secondary | ICD-10-CM

## 2021-06-17 LAB — CBC
HCT: 27.1 % — ABNORMAL LOW (ref 36.0–46.0)
Hemoglobin: 8.2 g/dL — ABNORMAL LOW (ref 12.0–15.0)
MCH: 38 pg — ABNORMAL HIGH (ref 26.0–34.0)
MCHC: 30.3 g/dL (ref 30.0–36.0)
MCV: 125.5 fL — ABNORMAL HIGH (ref 80.0–100.0)
Platelets: 245 10*3/uL (ref 150–400)
RBC: 2.16 MIL/uL — ABNORMAL LOW (ref 3.87–5.11)
RDW: 20.4 % — ABNORMAL HIGH (ref 11.5–15.5)
WBC: 7 10*3/uL (ref 4.0–10.5)
nRBC: 0 % (ref 0.0–0.2)

## 2021-06-17 LAB — BASIC METABOLIC PANEL
Anion gap: 15 (ref 5–15)
BUN: 38 mg/dL — ABNORMAL HIGH (ref 8–23)
CO2: 23 mmol/L (ref 22–32)
Calcium: 8.8 mg/dL — ABNORMAL LOW (ref 8.9–10.3)
Chloride: 98 mmol/L (ref 98–111)
Creatinine, Ser: 6.45 mg/dL — ABNORMAL HIGH (ref 0.44–1.00)
GFR, Estimated: 6 mL/min — ABNORMAL LOW (ref >60)
Glucose, Bld: 90 mg/dL (ref 70–99)
Potassium: 4.3 mmol/L (ref 3.5–5.1)
Sodium: 136 mmol/L (ref 135–145)

## 2021-06-17 LAB — TROPONIN I (HIGH SENSITIVITY): Troponin I (High Sensitivity): 23 ng/L — ABNORMAL HIGH (ref <18)

## 2021-06-17 LAB — MAGNESIUM: Magnesium: 1.9 mg/dL (ref 1.7–2.4)

## 2021-06-17 NOTE — ED Triage Notes (Signed)
Pt presents with chest pain off and on for about a week. Pt denies chest pain right now. Pt was sent from urgent care in [place] and EKG was performed with abnormalities, pt was instructed to go to ED for further evaluation.

## 2021-06-17 NOTE — ED Notes (Signed)
IV attempted x2, unable to obtain. MD notified.

## 2021-06-17 NOTE — ED Provider Notes (Signed)
[facility] EMERGENCY DEPARTMENT Provider Note   CSN: [id number] Arrival date & time: 06/17/21  2057     History Chief Complaint  Patient presents with   Chest Pain    Sent from urgent care in [place] and instructed to come to ED for further evaluation.     Shawna [name] is a 80 y.o. female.  HPI  80 year old female with past medical history of CAD with previous MI and intervention, CHF, anemia, HTN presents the emergency department with episodes of left-sided chest discomfort.  Of note recent admission for A. fib with RVR, controlled medically, not anticoagulated due to GI bleed issues.  Patient states for the past week she has been feeling generally unwell.  She has not had a specific complaint to the last couple nights.  She states when she rolls over or does certain movements she develops left-sided chest discomfort.  Denies any palpitations or shortness of breath.  No cough or fever.  At times she has taken a nitro for this discomfort with relief.  Currently she is chest pain and symptom-free.  Denies any swelling of her lower extremities.  No GI symptoms.  Past Medical History:  Diagnosis Date   Acute on chronic respiratory failure with hypoxia ([place]) 10/10/2016   Anxiety    Arthritis    AVM (arteriovenous malformation) of colon    CAD (coronary artery disease)    a. s/p CABG in 2013 b. DES to D1 in 10/2016. c. cath in 07/2018 showing patent grafts with occlusion of D1 at prior stent site and progression of PDA disease --> medical management recommended   Carotid artery disease ([place])    a. 56-38% LICA, 03/3733    Chronic anemia    Chronic bronchitis (HCC)    Chronic diastolic CHF (congestive heart failure) ([place])    a. 02/2012 Echo EF 60-65%, nl wall motion, Gr 1 DD, mod MR   Colon cancer ([place]) 1992   Esophageal stricture    ESRD on hemodialysis ([place])    ESRD due to HTN, started dialysis 2011 and gets HD at [facility] with Dr Hinda Lenis on MWF schedule.  Access is LUA AVF  as of Sept 2014.    GERD (gastroesophageal reflux disease)    High cholesterol 12/2011   History of blood transfusion 07/2011; 12/2011; 01/2012 X 2; 04/2012   History of gout    History of lower GI bleeding    Hypertension    Iron deficiency anemia    Jugular vein occlusion, right (HCC)    Mitral regurgitation    a. Moderate by echo, 02/2012   Myocardial infarction [facility])    NSVT (nonsustained ventricular tachycardia)    Ovarian cancer ([place]) 1992   PAF (paroxysmal atrial fibrillation) ([place])    Pneumonia ~ 2009   PUD (peptic ulcer disease)    TIA (transient ischemic attack)     Patient Active Problem List   Diagnosis Date Noted   Atrial fibrillation ([place]) 05/07/2021   Vitamin B deficiency 04/23/2021   Neuropathy 04/23/2021   Anemia due to chronic blood loss 04/13/2021   Left knee pain 03/15/2021   Moderate protein-calorie malnutrition ([place]) 02/27/2021   Hypokalemia 02/27/2021   COVID-19 virus infection 02/03/2021   Leukocytosis 02/03/2021   Elevated MCV 02/03/2021   Hypoalbuminemia due to protein-calorie malnutrition ([place]) 02/03/2021   Hypothyroidism (acquired) 02/03/2021   Myositis 12/03/2020   Ataxia 12/02/2020   Diabetes mellitus type 2 in nonobese ([place]) 11/10/2020   Acute pulmonary edema ([place]) 11/10/2020  Generalized weakness 11/09/2020   Dialysis AV fistula malfunction, initial encounter ([place])    Jugular vein occlusion, right (HCC)    Failure of surgically constructed arteriovenous fistula ([place]) 10/03/2020   Myoclonus 08/31/2020   Clotted renal dialysis AV graft, initial encounter ([place])    Hypertensive heart and chronic kidney disease with heart failure and stage 1 through stage 4 chronic kidney disease, or chronic kidney disease ([place])    Hemodialysis-associated hypotension    Acute hypoxemic respiratory failure ([place]) 06/14/2020   Hypertension 06/12/2020   Irritable bowel syndrome 02/25/2020   Adenomatous duodenal polyp 09/10/2019   History of GI bleed 09/10/2019    Angina pectoris ([place]) 06/05/2019   Chest pain 06/03/2019   Small intestinal bacterial overgrowth 05/14/2019   Iron deficiency anemia 04/02/2019   Acute respiratory failure with hypoxia ([place]) 12/25/2018   Elevated troponin 12/14/2018   Chest pain at rest 07/13/2018   Hand steal syndrome ([place]) 08/01/2017   Anemia 07/14/2017   Coronary artery disease 06/05/2017   Mesenteric ischemia (HCC)    Diverticulitis    Chronic diarrhea    Complication of vascular access for dialysis 03/19/2017   Preoperative clearance 01/25/2017   H/O non-ST elevation myocardial infarction (NSTEMI) 10/24/2016   Fluid overload 57/26/2035   Complication from renal dialysis device 10/10/2016   Non-ST elevation MI (NSTEMI) ([place])    Encounter for fitting and adjustment of vascular catheter    Heme positive stool    Demand ischemia ([place]) 07/27/2016   Hypertensive emergency 07/08/2016   Acute on chronic respiratory failure with hypoxia [facility])    Cardiac arrest [facility])    Palliative care encounter    Goals of care, counseling/discussion    Hypertensive crisis without congestive heart failure 05/09/2016   Flash pulmonary edema ([place]) 04/06/2016   Acute respiratory failure ([place]) 04/06/2016   Hypertensive crisis 01/27/2016   History of colon cancer 01/27/2016   History of ovarian cancer 01/27/2016   Hypertensive urgency 01/27/2016   Paroxysmal atrial fibrillation ([place]) 10/14/2015   Coronary angioplasty status 10/14/2015   Malignant neoplasm of right ovary ([place]) 10/14/2015   Narrow complex tachycardia ([place]) 09/08/2015   SVT (supraventricular tachycardia) ([place]) 09/08/2015   Influenza A 08/30/2015   Acute on chronic diastolic CHF (congestive heart failure) ([place]) 05/04/2015   Unstable angina ([place]) 05/03/2015   DOE (dyspnea on exertion)    Essential hypertension    Hyponatremia 10/01/2014   Pain in joint, lower leg 08/14/2014   Dacryocystitis 05/29/2013   Chronic diastolic CHF (congestive heart failure) ([place])  03/22/2013   Acute post-hemorrhagic anemia 03/21/2013   Occlusion and stenosis of carotid artery without mention of cerebral infarction 01/24/2013   Hx of CABG 07/05/2012   Carotid artery disease ([place]) 07/05/2012   Mitral regurgitation 06/12/2012   Pneumonia 06/09/2012   Non-STEMI (non-ST elevated myocardial infarction) ([name]) 06/08/2012   Ischemic chest pain ([place]) 03/01/2012   AVM (arteriovenous malformation) of small bowel, acquired 01/20/2012   GERD (gastroesophageal reflux disease) 01/09/2012   HLD (hyperlipidemia) 01/05/2012   Atherosclerotic heart disease of native coronary artery without angina pectoris 12/16/2011   Essential hypertension, benign 12/16/2011   ESRD on hemodialysis ([place]) 12/16/2011   Anxiety disorder 05/04/2011   Anemia in chronic kidney disease 04/29/2011   ESRD (end stage renal disease) on dialysis ([place]) 04/29/2011   Gout 04/29/2011   Hypertensive chronic kidney disease with stage 5 chronic kidney disease or end stage renal disease ([place]) 04/29/2011    Past Surgical History:  Procedure Laterality Date   A/V SHUNTOGRAM  Left 03/19/2019   Procedure: A/V SHUNTOGRAM;  Surgeon: Katha Cabal, MD;  Location: [place] CV LAB;  Service: Cardiovascular;  Laterality: Left;   ABDOMINAL HYSTERECTOMY  1992   APPENDECTOMY  06/1990   AV FISTULA PLACEMENT  07/2009   left upper arm   AV FISTULA PLACEMENT Right 09/06/2016   Procedure: RIGHT FOREARM ARTERIOVENOUS (AV) GRAFT;  Surgeon: Elam Dutch, MD;  Location: [place];  Service: Vascular;  Laterality: Right;   AV FISTULA PLACEMENT N/A 02/24/2017   Procedure: INSERTION OF ARTERIOVENOUS (AV) GORE-TEX GRAFT ARM (BRACHIAL AXILLARY);  Surgeon: Katha Cabal, MD;  Location: [facility] ORS;  Service: Vascular;  Laterality: N/A;   [place] Right 09/06/2016   Procedure: REMOVAL OF Right Arm ARTERIOVENOUS GORETEX GRAFT and Vein Patch angioplasty of brachial artery;  Surgeon: Angelia Mould, MD;  Location: [place];   Service: Vascular;  Laterality: Right;   BIOPSY  09/26/2019   Procedure: BIOPSY;  Surgeon: Rogene Houston, MD;  [facility];  Service: Endoscopy;;   COLON RESECTION  1992   COLON SURGERY     COLONOSCOPY N/A 03/09/2019   Procedure: COLONOSCOPY;  Surgeon: Rogene Houston, MD;  [facility];  Service: Endoscopy;  Laterality: N/A;   COLONOSCOPY WITH PROPOFOL N/A 06/08/2021   Procedure: COLONOSCOPY WITH PROPOFOL;  Surgeon: Harvel Quale, MD;  [facility];  Service: Gastroenterology;  Laterality: N/A;  9:05 /Patient is on dialysis Mon Wed Fri   CORONARY ANGIOPLASTY WITH STENT PLACEMENT  12/15/11   "2"   CORONARY ANGIOPLASTY WITH STENT PLACEMENT  y/2013   "1; makes total of 3" (05/02/2012)   CORONARY ARTERY BYPASS GRAFT  06/13/2012   Procedure: CORONARY ARTERY BYPASS GRAFTING (CABG);  Surgeon: Grace Isaac, MD;  Location: [place];  Service: Open Heart Surgery;  Laterality: N/A;  cabg x four;  using left internal mammary artery, and left leg greater saphenous vein harvested endoscopically   CORONARY STENT INTERVENTION N/A 10/13/2016   Procedure: Coronary Stent Intervention;  Surgeon: Troy Sine, MD;  Location: [place] CV LAB;  Service: Cardiovascular;  Laterality: N/A;   DIALYSIS/PERMA CATHETER REMOVAL N/A 04/18/2017   Procedure: DIALYSIS/PERMA CATHETER REMOVAL;  Surgeon: Katha Cabal, MD;  Location: [place] CV LAB;  Service: Cardiovascular;  Laterality: N/A;   DILATION AND CURETTAGE OF UTERUS     ENTEROSCOPY N/A 06/08/2021   Procedure: PUSH ENTEROSCOPY;  Surgeon: Harvel Quale, MD;  [facility];  Service: Gastroenterology;  Laterality: N/A;   ESOPHAGOGASTRODUODENOSCOPY  01/20/2012   Procedure: ESOPHAGOGASTRODUODENOSCOPY (EGD);  Surgeon: Ladene Artist, MD,FACG;  Location: [facility] ENDOSCOPY;  Service: Endoscopy;  Laterality: N/A;   ESOPHAGOGASTRODUODENOSCOPY N/A 03/26/2013   Procedure: ESOPHAGOGASTRODUODENOSCOPY  (EGD);  Surgeon: Irene Shipper, MD;  Location: [facility] ENDOSCOPY;  Service: Endoscopy;  Laterality: N/A;   ESOPHAGOGASTRODUODENOSCOPY N/A 04/30/2015   Procedure: ESOPHAGOGASTRODUODENOSCOPY (EGD);  Surgeon: Rogene Houston, MD;  [facility];  Service: Endoscopy;  Laterality: N/A;  1pm - moved to 10/20 @ 1:10   ESOPHAGOGASTRODUODENOSCOPY N/A 07/29/2016   Procedure: ESOPHAGOGASTRODUODENOSCOPY (EGD);  Surgeon: Manus Gunning, MD;  Location: [place];  Service: Gastroenterology;  Laterality: N/A;  enteroscopy   ESOPHAGOGASTRODUODENOSCOPY N/A 09/26/2019   Procedure: ESOPHAGOGASTRODUODENOSCOPY (EGD);  Surgeon: Rogene Houston, MD;  [facility];  Service: Endoscopy;  Laterality: N/A;  1250   ESOPHAGOGASTRODUODENOSCOPY (EGD) WITH PROPOFOL N/A 02/05/2021   Procedure: ESOPHAGOGASTRODUODENOSCOPY (EGD) WITH PROPOFOL;  Surgeon: Eloise Harman, DO;  [facility];  Service: Endoscopy;  Laterality: N/A;   GIVENS CAPSULE STUDY N/A 03/07/2019   Procedure: GIVENS CAPSULE STUDY;  Surgeon: Rogene Houston, MD;  [facility];  Service: Endoscopy;  Laterality: N/A;  7:30   GIVENS CAPSULE STUDY N/A 04/22/2021   Procedure: GIVENS CAPSULE STUDY;  Surgeon: Rogene Houston, MD;  [facility];  Service: Endoscopy;  Laterality: N/A;  7:30   INSERTION OF DIALYSIS CATHETER N/A 10/05/2020   Procedure: ABORTED TUNNELED DIALYSIS CATHETER PLACEMENT RIGHT INTERNAL JUGULAR VEIN ;  Surgeon: Virl Cagey, MD;  Location: [facility];  Service: General;  Laterality: N/A;   INTRAOPERATIVE TRANSESOPHAGEAL ECHOCARDIOGRAM  06/13/2012   Procedure: INTRAOPERATIVE TRANSESOPHAGEAL ECHOCARDIOGRAM;  Surgeon: Grace Isaac, MD;  Location: [place];  Service: Open Heart Surgery;  Laterality: N/A;   IR DIALY SHUNT INTRO NEEDLE/INTRACATH INITIAL W/IMG LEFT Left 10/06/2020   IR FLUORO GUIDE CV LINE RIGHT  06/17/2020   IR GENERIC HISTORICAL  07/26/2016   IR FLUORO GUIDE CV LINE RIGHT 07/26/2016  Greggory Keen, MD [facility]   IR GENERIC HISTORICAL  07/26/2016   IR US GUIDE VASC ACCESS RIGHT 07/26/2016 Greggory Keen, MD [facility]   IR GENERIC HISTORICAL  08/02/2016   IR US GUIDE VASC ACCESS RIGHT 08/02/2016 Greggory Keen, MD [facility]   IR GENERIC HISTORICAL  08/02/2016   IR FLUORO GUIDE CV LINE RIGHT 08/02/2016 Greggory Keen, MD [facility]   IR RADIOLOGY PERIPHERAL GUIDED IV START  03/28/2017   IR REMOVAL TUN CV CATH W/O FL  08/11/2020   IR THROMBECTOMY AV FISTULA W/THROMBOLYSIS INC/SHUNT/IMG LEFT Left 06/17/2020   IR US GUIDE VASC ACCESS LEFT  06/17/2020   IR US GUIDE VASC ACCESS RIGHT  03/28/2017   IR US GUIDE VASC ACCESS RIGHT  06/17/2020   LEFT HEART CATH AND CORONARY ANGIOGRAPHY N/A 09/20/2016   Procedure: Left Heart Cath and Coronary Angiography;  Surgeon: [name], MD;  Location: [place] CV LAB;  Service: Cardiovascular;  Laterality: N/A;   LEFT HEART CATH AND CORS/GRAFTS ANGIOGRAPHY N/A 10/13/2016   Procedure: Left Heart Cath and Cors/Grafts Angiography;  Surgeon: Troy Sine, MD;  Location: [place] CV LAB;  Service: Cardiovascular;  Laterality: N/A;   LEFT HEART CATH AND CORS/GRAFTS ANGIOGRAPHY N/A 07/13/2018   Procedure: LEFT HEART CATH AND CORS/GRAFTS ANGIOGRAPHY;  Surgeon: Martinique, Peter M, MD;  Location: [place] CV LAB;  Service: Cardiovascular;  Laterality: N/A;   LEFT HEART CATHETERIZATION WITH CORONARY ANGIOGRAM N/A 12/15/2011   Procedure: LEFT HEART CATHETERIZATION WITH CORONARY ANGIOGRAM;  Surgeon: Burnell Blanks, MD;  Location: [facility] CATH LAB;  Service: Cardiovascular;  Laterality: N/A;   LEFT HEART CATHETERIZATION WITH CORONARY ANGIOGRAM N/A 01/10/2012   Procedure: LEFT HEART CATHETERIZATION WITH CORONARY ANGIOGRAM;  Surgeon: Peter M Martinique, MD;  Location: [facility] CATH LAB;  Service: Cardiovascular;  Laterality: N/A;   LEFT HEART CATHETERIZATION WITH CORONARY ANGIOGRAM N/A 06/08/2012   Procedure: LEFT HEART CATHETERIZATION WITH CORONARY ANGIOGRAM;   Surgeon: Burnell Blanks, MD;  Location: [name] CATH LAB;  Service: Cardiovascular;  Laterality: N/A;   LEFT HEART CATHETERIZATION WITH CORONARY/GRAFT ANGIOGRAM N/A 12/10/2013   Procedure: LEFT HEART CATHETERIZATION WITH Beatrix Fetters;  Surgeon: [name], MD;  Location: [facility] CATH LAB;  Service: Cardiovascular;  Laterality: N/A;   OVARY SURGERY     ovarian cancer   POLYPECTOMY  03/09/2019   Procedure: POLYPECTOMY;  Surgeon: Rogene Houston, MD;  [facility];  Service: Endoscopy;;  cecal    POLYPECTOMY N/A 09/26/2019   Procedure: DUODENAL POLYPECTOMY;  Surgeon: Rogene Houston, MD;  [facility];  Service: Endoscopy;  Laterality: N/A;   REVISION OF ARTERIOVENOUS GORETEX GRAFT N/A 02/24/2017   Procedure: REVISION OF ARTERIOVENOUS GORETEX GRAFT (RESECTION);  Surgeon: Katha Cabal, MD;  Location: [facility] ORS;  Service: Vascular;  Laterality: N/A;   REVISON OF ARTERIOVENOUS FISTULA Left 06/19/2020   Procedure: REVISION OF LEFT UPPER ARM AV GRAFT WITH INTERPOSITION JUMP GRAFT USING 6MM GORE LIMB;  Surgeon: Marty Heck, MD;  Location: [place];  Service: Vascular;  Laterality: Left;   SHUNTOGRAM N/A 10/15/2013   Procedure: Fistulogram;  Surgeon: Serafina Mitchell, MD;  Location: [facility] CATH LAB;  Service: Cardiovascular;  Laterality: N/A;   THROMBECTOMY / ARTERIOVENOUS GRAFT REVISION  2011   left upper arm   TUBAL LIGATION  1980's   UPPER EXTREMITY ANGIOGRAPHY Bilateral 12/06/2016   Procedure: Upper Extremity Angiography;  Surgeon: Katha Cabal, MD;  Location: [place] CV LAB;  Service: Cardiovascular;  Laterality: Bilateral;   UPPER EXTREMITY INTERVENTION Left 06/06/2017   Procedure: UPPER EXTREMITY INTERVENTION;  Surgeon: Katha Cabal, MD;  Location: [place] CV LAB;  Service: Cardiovascular;  Laterality: Left;     OB History     Gravida  2   Para  2   Term      Preterm  2   AB      Living  2      SAB      IAB       Ectopic      Multiple      Live Births              Family History  Problem Relation Age of Onset   Heart disease Mother        Heart Disease before age 50   Hyperlipidemia Mother    Hypertension Mother    Diabetes Mother    Heart attack Mother    Heart disease Father        Heart Disease before age 41   Hyperlipidemia Father    Hypertension Father    Diabetes Father    Diabetes Sister    Hypertension Sister    Diabetes Brother    Hyperlipidemia Brother    Heart attack Brother    Hypertension Sister    Heart attack Brother    Colon cancer Child 53   Other Other        noncontributory for early CAD   Esophageal cancer Neg Hx    Liver disease Neg Hx    Kidney disease Neg Hx    Colon polyps Neg Hx     Social History   Tobacco Use   Smoking status: Never   Smokeless tobacco: Never  Vaping Use   Vaping Use: Never used  Substance Use Topics   Alcohol use: No    Alcohol/week: 0.0 standard drinks   Drug use: No    Home Medications Prior to Admission medications   Medication Sig Start Date End Date Taking? Authorizing Provider  ALPRAZolam Duanne Moron) 0.5 MG tablet Take 0.5 mg by mouth at bedtime. 03/20/21   [provider]  amiodarone (PACERONE) 200 MG tablet Take 1 tablet (200 mg total) by mouth daily. 05/13/21   Strader, Fransisco Hertz, PA-C  aspirin EC 81 MG tablet Take 81 mg by mouth daily.  09/27/19   Rehman, Mechele Dawley, MD  benzonatate (TESSALON) 100 MG capsule Take 100  mg by mouth 3 (three) times daily as needed for cough. 04/30/21   [provider]  cyclobenzaprine (FLEXERIL) 5 MG tablet Take 5 mg by mouth 3 (three) times daily as needed for muscle spasms.    [provider]  diclofenac Sodium (VOLTAREN) 1 % GEL Apply 2 g topically 4 (four) times daily as needed. 03/12/21   Long, Wonda Olds, MD  fluticasone (FLONASE) 50 MCG/ACT nasal spray Place 1 spray into both nostrils at bedtime as needed for allergies.     [provider]   gabapentin (NEURONTIN) 100 MG capsule Take 100 mg by mouth at bedtime.    [provider]  hydrOXYzine (VISTARIL) 100 MG capsule Take 100 mg by mouth in the morning and at bedtime. 11/23/20   [provider]  isosorbide mononitrate (IMDUR) 60 MG 24 hr tablet Take 1 tablet (60 mg total) by mouth in the morning and at bedtime. 03/25/21 06/23/21  Arnoldo Lenis, MD  levothyroxine (SYNTHROID) 25 MCG tablet Take 1 tablet (25 mcg total) by mouth daily. 02/02/21   Brita Romp, NP  lidocaine-prilocaine (EMLA) cream Apply 1 application topically every Monday, Wednesday, and Friday. Prior to dialysis    [provider]  loperamide (IMODIUM A-D) 2 MG tablet Take 2 tablets (4 mg total) by mouth daily as needed for diarrhea or loose stools. 12/17/20   Thurnell Lose, MD  metoprolol tartrate (LOPRESSOR) 50 MG tablet Take 1 tablet (50 mg total) by mouth 2 (two) times daily. 05/13/21   Ahmed Prima, Fransisco Hertz, PA-C  midodrine (PROAMATINE) 5 MG tablet Take Monday, Wednesday, Friday morning BEFORE dialysis 12/22/20   Arnoldo Lenis, MD  multivitamin (RENA-VIT) TABS tablet Take 1 tablet by mouth daily.    [provider]  Na Sulfate-K Sulfate-Mg Sulf (SUPREP BOWEL PREP KIT) 17.5-3.13-1.6 GM/177ML SOLN Take 1 kit by mouth as directed. 06/11/21   Harvel Quale, MD  nitroGLYCERIN (NITROSTAT) 0.4 MG SL tablet Place 1 tablet (0.4 mg total) under the tongue every 5 (five) minutes x 3 doses as needed for chest pain (if no relief after 2nd dose, proceed to the ED for an evaluation or call 911). 02/19/21   Verta Ellen., NP  omeprazole (PRILOSEC) 20 MG capsule Take 1 capsule (20 mg total) by mouth daily. Patient taking differently: Take 10 mg by mouth daily. 12/01/20   Rehman, Mechele Dawley, MD  rosuvastatin (CRESTOR) 10 MG tablet Take 1 tablet (10 mg total) by mouth daily. 05/13/21   Strader, Fransisco Hertz, PA-C  sevelamer carbonate (RENVELA) 800 MG tablet Take 2 tablets (1,600  mg total) by mouth 3 (three) times daily with meals. Patient taking differently: Take 2,400 mg by mouth 3 (three) times daily with meals. 06/20/20   Meccariello, Bernita Raisin, DO  simvastatin (ZOCOR) 20 MG tablet Take 20 mg by mouth daily.    [provider]    Allergies    Amlodipine, Aspirin, Nitrofurantoin, Penicillins, Bactrim [sulfamethoxazole-trimethoprim], Contrast media [iodinated diagnostic agents], Iron, Tylenol [acetaminophen], Gabapentin, Iron sucrose, No known allergies, Ranexa [ranolazine], Sucroferric oxyhydroxide, Clopidogrel, Dexilant [dexlansoprazole], Hydralazine, Levaquin [levofloxacin in d5w], Levofloxacin, Morphine, Morphine and related, Pantoprazole, Plavix [clopidogrel bisulfate], Protonix [pantoprazole sodium], and Venofer [ferric oxide]  Review of Systems   Review of Systems  Constitutional:  Positive for fatigue. Negative for chills and fever.  HENT:  Negative for congestion.   Eyes:  Negative for visual disturbance.  Respiratory:  Negative for shortness of breath.   Cardiovascular:  Positive for chest pain.  Negative for palpitations and leg swelling.  Gastrointestinal:  Negative for abdominal pain, diarrhea and vomiting.  Genitourinary:  Negative for dysuria.  Skin:  Negative for rash.  Neurological:  Negative for syncope, light-headedness and headaches.   Physical Exam Updated Vital Signs BP (!) 143/37   Pulse 64   Resp 15   Ht _0  (1.549 m)   Wt 55.8 kg   SpO2 97%   BMI 23.24 kg/m   Physical Exam Vitals and nursing note reviewed.  Constitutional:      Appearance: Normal appearance. She is not toxic-appearing or diaphoretic.  HENT:     Head: Normocephalic.     Mouth/Throat:     Mouth: Mucous membranes are moist.  Cardiovascular:     Rate and Rhythm: Normal rate.  Pulmonary:     Effort: Pulmonary effort is normal. No respiratory distress.  Abdominal:     Palpations: Abdomen is soft.     Tenderness: There is no abdominal tenderness.   Musculoskeletal:     Right lower leg: No edema.     Left lower leg: No edema.  Skin:    General: Skin is warm.  Neurological:     Mental Status: She is alert and oriented to person, place, and time. Mental status is at baseline.  Psychiatric:        Mood and Affect: Mood normal.    ED Results / Procedures / Treatments   Labs (all labs ordered are listed, but only abnormal results are displayed) Labs Reviewed  CBC - Abnormal; Notable for the following components:      Result Value   RBC 2.16 (*)    Hemoglobin 8.2 (*)    HCT 27.1 (*)    MCV 125.5 (*)    MCH 38.0 (*)    RDW 20.4 (*)    All other components within normal limits  TROPONIN I (HIGH SENSITIVITY) - Abnormal; Notable for the following components:   Troponin I (High Sensitivity) 23 (*)    All other components within normal limits  BASIC METABOLIC PANEL  MAGNESIUM  TROPONIN I (HIGH SENSITIVITY)    EKG EKG Interpretation  Date/Time:  Thursday June 17 2021 21:16:27 EST Ventricular Rate:  69 PR Interval:  160 QRS Duration: 116 QT Interval:  452 QTC Calculation: 484 R Axis:   3 Text Interpretation: Sinus rhythm with frequent Premature ventricular complexes Incomplete left bundle branch block Minimal voltage criteria for LVH, may be normal variant ( Cornell product ) ST & T wave abnormality, consider lateral ischemia Prolonged QT LBBB old, trigeminy PVC Abnormal ECG Confirmed by Lavenia Atlas [phone]) on 06/17/2021 9:23:54 PM  Radiology DG Chest 2 View  Result Date: 06/17/2021 CLINICAL DATA:  Chest pain EXAM: CHEST - 2 VIEW COMPARISON:  05/04/2021 FINDINGS: Minimal bibasilar atelectasis. Lungs are otherwise clear. No pneumothorax or pleural effusion. Coronary artery bypass grafting has been performed. Mild cardiomegaly is stable. No acute bone abnormality. IMPRESSION: No active cardiopulmonary disease.  Stable cardiomegaly. Electronically Signed   By: Fidela Salisbury M.D.   On: 06/17/2021 22:10     Procedures Procedures   Medications Ordered in ED Medications - No data to display  ED Course  I have reviewed the triage vital signs and the nursing notes.  Pertinent labs & imaging results that were available during my care of the patient were reviewed by me and considered in my medical decision making (see chart for details).    MDM Rules/Calculators/A&P  80 year old female presents emergency department with concern for general unwell feeling and episodes of chest pain.  Recent admission for episode of A. fib with RVR that is being controlled medically with amiodarone.  No anticoagulation secondary to GI bleed issues.  Was seen at urgent care prior, they did an EKG and sent her here.  She denies any palpitations or shortness of breath.  Currently feels baseline and has no complaints.  EKG shows baseline left bundle branch block with PVCs in a trigeminy pattern.  Initial blood work reassuring with a baseline anemia and hemoglobin 8.2.  Initial troponin is 23, chart review shows that when she was admitted for A. fib with RVR her troponins were in the 100s.  Recent 1 before that was in the 33s.  This may be baseline for the patient.  Again no active chest pain.  Plan for repeat troponin and chemistry including magnesium with reevaluation.  Lower suspicion that this is acute ACS with baseline EKG and initial troponin.  Lower suspicion for PE in the absence of ongoing symptoms/shortness of breath.  Doubt dissection given the absence of chest/back pain.  Final Clinical Impression(s) / ED Diagnoses Final diagnoses:  None    Rx / DC Orders ED Discharge Orders     None        Lorelle Gibbs, DO 06/17/21 2341

## 2021-06-18 ENCOUNTER — Other Ambulatory Visit (INDEPENDENT_AMBULATORY_CARE_PROVIDER_SITE_OTHER): Payer: Self-pay

## 2021-06-18 DIAGNOSIS — I739 Peripheral vascular disease, unspecified: Secondary | ICD-10-CM

## 2021-06-18 LAB — TROPONIN I (HIGH SENSITIVITY): Troponin I (High Sensitivity): 25 ng/L — ABNORMAL HIGH (ref <18)

## 2021-06-18 NOTE — ED Notes (Signed)
Pt given peanut butter and crackers and ice water per request. Ok per DR Dina Rich

## 2021-06-18 NOTE — ED Provider Notes (Signed)
  Provider Note MRN:  [id number]  Arrival date & time: 06/18/21    ED Course and Medical Decision Making  Assumed care from Dr. Dina Rich at shift change.  Chest pain but seems more MSK, worse with palpation or sleeping on that side of the body.  Troponin is minimally elevated and flat upon repeat.  Patient has had no pain here in the emergency department, appropriate for discharge with follow-up.  Procedures  Final Clinical Impressions(s) / ED Diagnoses     ICD-10-CM   1. Chest pain, unspecified type  R07.9       ED Discharge Orders     None         Discharge Instructions      You were evaluated in the Emergency Department and after careful evaluation, we did not find any emergent condition requiring admission or further testing in the hospital.  Your exam/testing today was overall reassuring.  Please return to the Emergency Department if you experience any worsening of your condition.  Thank you for allowing [place] to be a part of your care.     Barth Kirks. Sedonia Small, [place] [email or web address]    Maudie Flakes, MD 06/18/21 [phone]

## 2021-06-18 NOTE — Discharge Instructions (Signed)
You were evaluated in the Emergency Department and after careful evaluation, we did not find any emergent condition requiring admission or further testing in the hospital.  Your exam/testing today was overall reassuring.  Please return to the Emergency Department if you experience any worsening of your condition.  Thank you for allowing us to be a part of your care.  

## 2021-06-22 ENCOUNTER — Ambulatory Visit: Payer: Medicare HMO | Admitting: Cardiology

## 2021-06-22 ENCOUNTER — Encounter: Payer: Self-pay | Admitting: Cardiology

## 2021-06-22 VITALS — BP 140/58 | HR 64 | Ht 61.0 in | Wt 123.6 lb

## 2021-06-22 DIAGNOSIS — I4891 Unspecified atrial fibrillation: Secondary | ICD-10-CM

## 2021-06-22 DIAGNOSIS — E782 Mixed hyperlipidemia: Secondary | ICD-10-CM | POA: Diagnosis not present

## 2021-06-22 DIAGNOSIS — I2510 Atherosclerotic heart disease of native coronary artery without angina pectoris: Secondary | ICD-10-CM

## 2021-06-22 MED ORDER — ISOSORBIDE MONONITRATE ER 60 MG PO TB24: 90.0000 mg | ORAL_TABLET | Freq: Two times a day (BID) | ORAL | 3 refills | Status: DC

## 2021-06-22 NOTE — Patient Instructions (Addendum)
Medication Instructions:  Increase Imdur to 90mg  twice a day   Should only be on Crestor 10mg  daily for your cholesterol.  Continue all other medications.     Labwork: none  Testing/Procedures: none  Follow-Up: 4 months   Any Other Special Instructions Will Be Listed Below (If Applicable).   If you need a refill on your cardiac medications before your next appointment, please call your pharmacy.

## 2021-06-22 NOTE — Progress Notes (Signed)
Clinical Summary Shawna [name] is a 80 y.o.female seen today for follow up of the following medical problems.   1.CAD - history of CABG in 2013, along with DES to D1 in 10/2016 - cath in 07/2018 showing patent grafts with occlusion of D1 at prior stent site and progression of PDA disease --> medical management recommended)   - did not tolerate Ranexa and had hypotension with BB therapy in the past. - some chest pain every 2-3 days. Overall stable.     06/17/21 ER vsiit with chest pain - Trop 23-25 - EKG SR, LBBB with PVCs - she describes the pain was positional, worst with palpation in the ER.  - pain improves with NG.Takes NG 1-2 times per week.     2. Chronic diastolic HF - fluid managed with HD     3. HTN - compliant with meds, complicated by low bp's on HD.      4. Hyperlipidemia - 11/2020 TC 123 TG 87 LDL 48  - has both simva and rosuvastatin on her list, she is not sure what she is taking.      5. PAF - not on anticoagulation given history of GIB   - admit 04/2021 with SVT and PAF. - restarted on amiodarone. - occasional symptoms with laying down, infrequent.     6. NSVT - had been on amio according to notes - amio was stopped at 12/17/20 discharge in setting of elevated TSH - back on amio after 04/2021 admission     7. ESRD - occasional low bp's during HD        8. 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  Past Medical History:  Diagnosis Date   Acute on chronic respiratory failure with hypoxia ([name]) 10/10/2016   Anxiety    Arthritis    AVM (arteriovenous malformation) of colon    CAD (coronary artery disease)    a. s/p CABG in 2013 b. DES to D1 in 10/2016. c. cath in 07/2018 showing patent grafts with occlusion of D1 at prior stent site and progression of PDA disease --> medical management recommended   Carotid artery disease ([place])    a. 18-59% LICA, 0/9311    Chronic anemia    Chronic bronchitis (HCC)    Chronic  diastolic CHF (congestive heart failure) ([place])    a. 02/2012 Echo EF 60-65%, nl wall motion, Gr 1 DD, mod MR   Colon cancer ([place]) 1992   Esophageal stricture    ESRD on hemodialysis ([place])    ESRD due to HTN, started dialysis 2011 and gets HD at [facility] with Dr Hinda Lenis on MWF schedule.  Access is LUA AVF as of Sept 2014.    GERD (gastroesophageal reflux disease)    High cholesterol 12/2011   History of blood transfusion 07/2011; 12/2011; 01/2012 X 2; 04/2012   History of gout    History of lower GI bleeding    Hypertension    Iron deficiency anemia    Jugular vein occlusion, right (HCC)    Mitral regurgitation    a. Moderate by echo, 02/2012   Myocardial infarction [facility])    NSVT (nonsustained ventricular tachycardia)    Ovarian cancer ([place]) 1992   PAF (paroxysmal atrial fibrillation) ([place])    Pneumonia ~ 2009   PUD (peptic ulcer disease)    TIA (transient ischemic attack)      Allergies  Allergen Reactions   Amlodipine Swelling   Aspirin Other (See Comments)  High Doses Mess up her stomach; "makes my bowels have blood in them". Takes 81 mg EC Aspirin    Nitrofurantoin Hives   Penicillins Other (See Comments)    SYNCOPE? , "makes me real weak when I take it; like I'll pass out"  Has patient had a PCN reaction causing immediate rash, facial/tongue/throat swelling, SOB or lightheadedness with hypotension: Yes Has patient had a PCN reaction causing severe rash involving mucus membranes or skin necrosis: no Has patient had a PCN reaction that required hospitalization no Has patient had a PCN reaction occurring within the last 10 years: no If all of the above    Bactrim [Sulfamethoxazole-Trimethoprim] Rash   Contrast Media [Iodinated Diagnostic Agents] Itching   Iron Itching and Other (See Comments)    "they gave me iron in dialysis; had to give me Benadryl cause I had to have the iron" (05/02/2012)   Tylenol [Acetaminophen] Itching and Other (See Comments)    Makes her feet  on fire per pt   Gabapentin Other (See Comments)    Unknown reaction   Iron Sucrose    No Known Allergies    Ranexa [Ranolazine] Other (See Comments)    Myoclonus-hospitalized    Sucroferric Oxyhydroxide    Clopidogrel Rash   Dexilant [Dexlansoprazole] Other (See Comments)    Upset stomach   Hydralazine Itching    Has tolerated while inpatient Has tolerated while inpatient   Levaquin [Levofloxacin In D5w] Rash   Levofloxacin Rash   Morphine Itching   Morphine And Related Itching and Other (See Comments)    Itching in feet   Pantoprazole Rash   Plavix [Clopidogrel Bisulfate] Rash   Protonix [Pantoprazole Sodium] Rash   Venofer [Ferric Oxide] Itching and Other (See Comments)    Patient reports using Benadryl prior to doses as [place]     Current Outpatient Medications  Medication Sig Dispense Refill   ALPRAZolam (XANAX) 0.5 MG tablet Take 0.5 mg by mouth at bedtime.     amiodarone (PACERONE) 200 MG tablet Take 1 tablet (200 mg total) by mouth daily. 90 tablet 2   aspirin EC 81 MG tablet Take 81 mg by mouth daily.      benzonatate (TESSALON) 100 MG capsule Take 100 mg by mouth 3 (three) times daily as needed for cough.     cyclobenzaprine (FLEXERIL) 5 MG tablet Take 5 mg by mouth 3 (three) times daily as needed for muscle spasms.     diclofenac Sodium (VOLTAREN) 1 % GEL Apply 2 g topically 4 (four) times daily as needed. 100 g 0   fluticasone (FLONASE) 50 MCG/ACT nasal spray Place 1 spray into both nostrils at bedtime as needed for allergies.      gabapentin (NEURONTIN) 100 MG capsule Take 100 mg by mouth at bedtime.     hydrOXYzine (VISTARIL) 100 MG capsule Take 100 mg by mouth in the morning and at bedtime.     isosorbide mononitrate (IMDUR) 60 MG 24 hr tablet Take 1 tablet (60 mg total) by mouth in the morning and at bedtime. 180 tablet 3   levothyroxine (SYNTHROID) 25 MCG tablet Take 1 tablet (25 mcg total) by mouth daily. 90 tablet 0   lidocaine-prilocaine (EMLA)  cream Apply 1 application topically every Monday, Wednesday, and Friday. Prior to dialysis     loperamide (IMODIUM A-D) 2 MG tablet Take 2 tablets (4 mg total) by mouth daily as needed for diarrhea or loose stools.     metoprolol tartrate (LOPRESSOR) 50 MG  tablet Take 1 tablet (50 mg total) by mouth 2 (two) times daily. 180 tablet 1   midodrine (PROAMATINE) 5 MG tablet Take Monday, Wednesday, Friday morning BEFORE dialysis 48 tablet 6   multivitamin (RENA-VIT) TABS tablet Take 1 tablet by mouth daily.     Na Sulfate-K Sulfate-Mg Sulf (SUPREP BOWEL PREP KIT) 17.5-3.13-1.6 GM/177ML SOLN Take 1 kit by mouth as directed. 354 mL 0   nitroGLYCERIN (NITROSTAT) 0.4 MG SL tablet Place 1 tablet (0.4 mg total) under the tongue every 5 (five) minutes x 3 doses as needed for chest pain (if no relief after 2nd dose, proceed to the ED for an evaluation or call 911). 25 tablet 3   omeprazole (PRILOSEC) 20 MG capsule Take 1 capsule (20 mg total) by mouth daily. (Patient taking differently: Take 10 mg by mouth daily.) 90 capsule 3   rosuvastatin (CRESTOR) 10 MG tablet Take 1 tablet (10 mg total) by mouth daily. 90 tablet 3   sevelamer carbonate (RENVELA) 800 MG tablet Take 2 tablets (1,600 mg total) by mouth 3 (three) times daily with meals. (Patient taking differently: Take 2,400 mg by mouth 3 (three) times daily with meals.) 180 tablet 1   simvastatin (ZOCOR) 20 MG tablet Take 20 mg by mouth daily.     No current facility-administered medications for this visit.     Past Surgical History:  Procedure Laterality Date   A/V SHUNTOGRAM Left 03/19/2019   Procedure: A/V SHUNTOGRAM;  Surgeon: Katha Cabal, MD;  Location: [place] [facility];  Service: Cardiovascular;  Laterality: Left;   ABDOMINAL HYSTERECTOMY  1992   APPENDECTOMY  06/1990   AV FISTULA PLACEMENT  07/2009   left upper arm   AV FISTULA PLACEMENT Right 09/06/2016   Procedure: RIGHT FOREARM ARTERIOVENOUS (AV) GRAFT;  Surgeon: Elam Dutch, MD;   Location: [place];  Service: Vascular;  Laterality: Right;   AV FISTULA PLACEMENT N/A 02/24/2017   Procedure: INSERTION OF ARTERIOVENOUS (AV) GORE-TEX GRAFT ARM (BRACHIAL AXILLARY);  Surgeon: Katha Cabal, MD;  Location: [facility];  Service: Vascular;  Laterality: N/A;   [place] Right 09/06/2016   Procedure: REMOVAL OF Right Arm ARTERIOVENOUS GORETEX GRAFT and Vein Patch angioplasty of brachial artery;  Surgeon: Angelia Mould, MD;  Location: [place];  Service: Vascular;  Laterality: Right;   BIOPSY  09/26/2019   Procedure: BIOPSY;  Surgeon: Rogene Houston, MD;  [facility];  Service: Endoscopy;;   COLON RESECTION  1992   COLON SURGERY     COLONOSCOPY N/A 03/09/2019   Procedure: COLONOSCOPY;  Surgeon: Rogene Houston, MD;  [facility];  Service: Endoscopy;  Laterality: N/A;   COLONOSCOPY WITH PROPOFOL N/A 06/08/2021   Procedure: COLONOSCOPY WITH PROPOFOL;  Surgeon: Harvel Quale, MD;  [facility];  Service: Gastroenterology;  Laterality: N/A;  9:05 /Patient is on dialysis Mon Wed Fri   CORONARY ANGIOPLASTY WITH STENT PLACEMENT  12/15/11   "2"   CORONARY ANGIOPLASTY WITH STENT PLACEMENT  y/2013   "1; makes total of 3" (05/02/2012)   CORONARY ARTERY BYPASS GRAFT  06/13/2012   Procedure: CORONARY ARTERY BYPASS GRAFTING (CABG);  Surgeon: Grace Isaac, MD;  Location: [place];  Service: Open Heart Surgery;  Laterality: N/A;  cabg x four;  using left internal mammary artery, and left leg greater saphenous vein harvested endoscopically   CORONARY STENT INTERVENTION N/A 10/13/2016   Procedure: Coronary Stent Intervention;  Surgeon: Troy Sine, MD;  [facility]  INVASIVE CV LAB;  Service: Cardiovascular;  Laterality: N/A;   DIALYSIS/PERMA CATHETER REMOVAL N/A 04/18/2017   Procedure: DIALYSIS/PERMA CATHETER REMOVAL;  Surgeon: Katha Cabal, MD;  Location: [place] [facility];  Service: Cardiovascular;  Laterality: N/A;   DILATION AND  CURETTAGE OF UTERUS     ENTEROSCOPY N/A 06/08/2021   Procedure: PUSH ENTEROSCOPY;  Surgeon: Harvel Quale, MD;  [facility];  Service: Gastroenterology;  Laterality: N/A;   ESOPHAGOGASTRODUODENOSCOPY  01/20/2012   Procedure: ESOPHAGOGASTRODUODENOSCOPY (EGD);  Surgeon: Ladene Artist, MD,FACG;  Location: [facility] ENDOSCOPY;  Service: Endoscopy;  Laterality: N/A;   ESOPHAGOGASTRODUODENOSCOPY N/A 03/26/2013   Procedure: ESOPHAGOGASTRODUODENOSCOPY (EGD);  Surgeon: Irene Shipper, MD;  Location: [facility]OS[facility];  Service: Endoscopy;  Laterality: N/A;   ESOPHAGOGASTRODUODENOSCOPY N/A 04/30/2015   Procedure: ESOPHAGOGASTRODUODENOSCOPY (EGD);  Surgeon: Rogene Houston, MD;  [facility];  Service: Endoscopy;  Laterality: N/A;  1pm - moved to 10/20 @ 1:10   ESOPHAGOGASTRODUODENOSCOPY N/A 07/29/2016   Procedure: ESOPHAGOGASTRODUODENOSCOPY (EGD);  Surgeon: Manus Gunning, MD;  Location: [place];  Service: Gastroenterology;  Laterality: N/A;  enteroscopy   ESOPHAGOGASTRODUODENOSCOPY N/A 09/26/2019   Procedure: ESOPHAGOGASTRODUODENOSCOPY (EGD);  Surgeon: Rogene Houston, MD;  [facility];  Service: Endoscopy;  Laterality: N/A;  1250   ESOPHAGOGASTRODUODENOSCOPY (EGD) WITH PROPOFOL N/A 02/05/2021   Procedure: ESOPHAGOGASTRODUODENOSCOPY (EGD) WITH PROPOFOL;  Surgeon: Eloise Harman, DO;  [facility];  Service: Endoscopy;  Laterality: N/A;   GIVENS CAPSULE STUDY N/A 03/07/2019   Procedure: GIVENS CAPSULE STUDY;  Surgeon: Rogene Houston, MD;  [facility];  Service: Endoscopy;  Laterality: N/A;  7:30   GIVENS CAPSULE STUDY N/A 04/22/2021   Procedure: GIVENS CAPSULE STUDY;  Surgeon: Rogene Houston, MD;  [facility];  Service: Endoscopy;  Laterality: N/A;  7:30   INSERTION OF DIALYSIS CATHETER N/A 10/05/2020   Procedure: ABORTED TUNNELED DIALYSIS CATHETER PLACEMENT RIGHT INTERNAL JUGULAR VEIN ;  Surgeon: Virl Cagey, MD;   [facility];  Service: General;  Laterality: N/A;   INTRAOPERATIVE TRANSESOPHAGEAL ECHOCARDIOGRAM  06/13/2012   Procedure: INTRAOPERATIVE TRANSESOPHAGEAL ECHOCARDIOGRAM;  Surgeon: Grace Isaac, MD;  Location: [place];  Service: Open Heart Surgery;  Laterality: N/A;   IR DIALY SHUNT INTRO NEEDLE/INTRACATH INITIAL W/IMG LEFT Left 10/06/2020   IR FLUORO GUIDE CV LINE RIGHT  06/17/2020   IR GENERIC HISTORICAL  07/26/2016   IR FLUORO GUIDE CV LINE RIGHT 07/26/2016 Greggory Keen, MD [facility]   IR GENERIC HISTORICAL  07/26/2016   IR US GUIDE VASC ACCESS RIGHT 07/26/2016 Greggory Keen, MD [facility]   IR GENERIC HISTORICAL  08/02/2016   IR US GUIDE VASC ACCESS RIGHT 08/02/2016 Greggory Keen, MD [facility]   IR GENERIC HISTORICAL  08/02/2016   IR FLUORO GUIDE CV LINE RIGHT 08/02/2016 Greggory Keen, MD [facility]   IR RADIOLOGY PERIPHERAL GUIDED IV START  03/28/2017   IR REMOVAL TUN CV CATH W/O FL  08/11/2020   IR THROMBECTOMY AV FISTULA W/THROMBOLYSIS INC/SHUNT/IMG LEFT Left 06/17/2020   IR US GUIDE VASC ACCESS LEFT  06/17/2020   IR US GUIDE VASC ACCESS RIGHT  03/28/2017   IR US GUIDE VASC ACCESS RIGHT  06/17/2020   LEFT HEART CATH AND CORONARY ANGIOGRAPHY N/A 09/20/2016   Procedure: Left Heart Cath and Coronary Angiography;  Surgeon: [name], MD;  Location: [place] [facility];  Service: Cardiovascular;  Laterality: N/A;   LEFT HEART CATH AND CORS/GRAFTS ANGIOGRAPHY N/A  10/13/2016   Procedure: Left Heart Cath and Cors/Grafts Angiography;  Surgeon: Troy Sine, MD;  Location: [place] [facility];  Service: Cardiovascular;  Laterality: N/A;   LEFT HEART CATH AND CORS/GRAFTS ANGIOGRAPHY N/A 07/13/2018   Procedure: LEFT HEART CATH AND CORS/GRAFTS ANGIOGRAPHY;  Surgeon: Martinique, Peter M, MD;  Location: [place] [facility];  Service: Cardiovascular;  Laterality: N/A;   LEFT HEART CATHETERIZATION WITH CORONARY ANGIOGRAM N/A 12/15/2011   Procedure: LEFT HEART CATHETERIZATION WITH CORONARY ANGIOGRAM;   Surgeon: Burnell Blanks, MD;  Location: [facility] CATH LAB;  Service: Cardiovascular;  Laterality: N/A;   LEFT HEART CATHETERIZATION WITH CORONARY ANGIOGRAM N/A 01/10/2012   Procedure: LEFT HEART CATHETERIZATION WITH CORONARY ANGIOGRAM;  Surgeon: Peter M Martinique, MD;  Location: [facility] CATH LAB;  Service: Cardiovascular;  Laterality: N/A;   LEFT HEART CATHETERIZATION WITH CORONARY ANGIOGRAM N/A 06/08/2012   Procedure: LEFT HEART CATHETERIZATION WITH CORONARY ANGIOGRAM;  Surgeon: Burnell Blanks, MD;  Location: [facility] CATH LAB;  Service: Cardiovascular;  Laterality: N/A;   LEFT HEART CATHETERIZATION WITH CORONARY/GRAFT ANGIOGRAM N/A 12/10/2013   Procedure: LEFT HEART CATHETERIZATION WITH Beatrix Fetters;  Surgeon: [name], MD;  Location: [facility] CATH LAB;  Service: Cardiovascular;  Laterality: N/A;   OVARY SURGERY     ovarian cancer   POLYPECTOMY  03/09/2019   Procedure: POLYPECTOMY;  Surgeon: Rogene Houston, MD;  [facility];  Service: Endoscopy;;  cecal    POLYPECTOMY N/A 09/26/2019   Procedure: DUODENAL POLYPECTOMY;  Surgeon: Rogene Houston, MD;  [facility];  Service: Endoscopy;  Laterality: N/A;   REVISION OF ARTERIOVENOUS GORETEX GRAFT N/A 02/24/2017   Procedure: REVISION OF ARTERIOVENOUS GORETEX GRAFT (RESECTION);  Surgeon: Katha Cabal, MD;  Location: [facility];  Service: Vascular;  Laterality: N/A;   REVISON OF ARTERIOVENOUS FISTULA Left 06/19/2020   Procedure: REVISION OF LEFT UPPER ARM AV GRAFT WITH INTERPOSITION JUMP GRAFT USING 6MM GORE LIMB;  Surgeon: Marty Heck, MD;  Location: [place];  Service: Vascular;  Laterality: Left;   SHUNTOGRAM N/A 10/15/2013   Procedure: Fistulogram;  Surgeon: Serafina Mitchell, MD;  Location: [facility] CATH LAB;  Service: Cardiovascular;  Laterality: N/A;   THROMBECTOMY / ARTERIOVENOUS GRAFT REVISION  2011   left upper arm   TUBAL LIGATION  1980's   UPPER EXTREMITY ANGIOGRAPHY Bilateral 12/06/2016   Procedure: Upper  Extremity Angiography;  Surgeon: Katha Cabal, MD;  Location: [place] [facility];  Service: Cardiovascular;  Laterality: Bilateral;   UPPER EXTREMITY INTERVENTION Left 06/06/2017   Procedure: UPPER EXTREMITY INTERVENTION;  Surgeon: Katha Cabal, MD;  Location: [place] [facility];  Service: Cardiovascular;  Laterality: Left;     Allergies  Allergen Reactions   Amlodipine Swelling   Aspirin Other (See Comments)    High Doses Mess up her stomach; "makes my bowels have blood in them". Takes 81 mg EC Aspirin    Nitrofurantoin Hives   Penicillins Other (See Comments)    SYNCOPE? , "makes me real weak when I take it; like I'll pass out"  Has patient had a PCN reaction causing immediate rash, facial/tongue/throat swelling, SOB or lightheadedness with hypotension: Yes Has patient had a PCN reaction causing severe rash involving mucus membranes or skin necrosis: no Has patient had a PCN reaction that required hospitalization no Has patient had a PCN reaction occurring within the last 10 years: no If all of the above    Bactrim [Sulfamethoxazole-Trimethoprim] Rash   Contrast Media [Iodinated Diagnostic Agents]  Itching   Iron Itching and Other (See Comments)    "they gave me iron in dialysis; had to give me Benadryl cause I had to have the iron" (05/02/2012)   Tylenol [Acetaminophen] Itching and Other (See Comments)    Makes her feet on fire per pt   Gabapentin Other (See Comments)    Unknown reaction   Iron Sucrose    No Known Allergies    Ranexa [Ranolazine] Other (See Comments)    Myoclonus-hospitalized    Sucroferric Oxyhydroxide    Clopidogrel Rash   Dexilant [Dexlansoprazole] Other (See Comments)    Upset stomach   Hydralazine Itching    Has tolerated while inpatient Has tolerated while inpatient   Levaquin [Levofloxacin In D5w] Rash   Levofloxacin Rash   Morphine Itching   Morphine And Related Itching and Other (See Comments)    Itching in feet   Pantoprazole  Rash   Plavix [Clopidogrel Bisulfate] Rash   Protonix [Pantoprazole Sodium] Rash   Venofer [Ferric Oxide] Itching and Other (See Comments)    Patient reports using Benadryl prior to doses as [place]      Family History  Problem Relation Age of Onset   Heart disease Mother        Heart Disease before age 34   Hyperlipidemia Mother    Hypertension Mother    Diabetes Mother    Heart attack Mother    Heart disease Father        Heart Disease before age 64   Hyperlipidemia Father    Hypertension Father    Diabetes Father    Diabetes Sister    Hypertension Sister    Diabetes Brother    Hyperlipidemia Brother    Heart attack Brother    Hypertension Sister    Heart attack Brother    Colon cancer Child 69   Other Other        noncontributory for early CAD   Esophageal cancer Neg Hx    Liver disease Neg Hx    Kidney disease Neg Hx    Colon polyps Neg Hx      Social History Ms. Vandam reports that she has never smoked. She has never used smokeless tobacco. Ms. Kerney reports no history of alcohol use.   Review of Systems CONSTITUTIONAL: No weight loss, fever, chills, weakness or fatigue.  HEENT: Eyes: No visual loss, blurred vision, double vision or yellow sclerae.No hearing loss, sneezing, congestion, runny nose or sore throat.  SKIN: No rash or itching.  CARDIOVASCULAR: per hpi RESPIRATORY: No shortness of breath, cough or sputum.  GASTROINTESTINAL: No anorexia, nausea, vomiting or diarrhea. No abdominal pain or blood.  GENITOURINARY: No burning on urination, no polyuria NEUROLOGICAL: No headache, dizziness, syncope, paralysis, ataxia, numbness or tingling in the extremities. No change in bowel or bladder control.  MUSCULOSKELETAL: No muscle, back pain, joint pain or stiffness.  LYMPHATICS: No enlarged nodes. No history of splenectomy.  PSYCHIATRIC: No history of depression or anxiety.  ENDOCRINOLOGIC: No reports of sweating, cold or heat intolerance. No polyuria  or polydipsia.  Marland Kitchen   Physical Examination Today's Vitals   06/22/21 1051  BP: (!) 140/58  Pulse: 64  Weight: 123 lb 9.6 oz (56.1 kg)  Height: 5' 1"  (1.549 m)   Body mass index is 23.35 kg/m.  Gen: resting comfortably, no acute distress HEENT: no scleral icterus, pupils equal round and reactive, no palptable cervical adenopathy,  CV: RRR, no m/r/ gno jvd Resp: Clear to auscultation bilaterally GI:  abdomen is soft, non-tender, non-distended, normal bowel sounds, no hepatosplenomegaly MSK: extremities are warm, no edema.  Skin: warm, no rash Neuro:  no focal deficits Psych: appropriate affect     Assessment and Plan  1.CAD - chronic stable symptoms, antianginal therpay has been limited due to low bp's and side effects - recent atypical symptoms though she reports better with SL NG, will try increasing imdur to 41m daily    2. AFib/PSVT - infrequent palpitations, continue amiodarone and lopressor - not on anticoag due to prior GI bleeding   3. Hyperlipidemia - should be on just crestor 177mdaily, will clarify not also getting simvastatin with her pharmacy.    JoArnoldo LenisM.D

## 2021-06-23 ENCOUNTER — Other Ambulatory Visit (HOSPITAL_COMMUNITY): Payer: Self-pay | Admitting: Gastroenterology

## 2021-06-23 DIAGNOSIS — Z789 Other specified health status: Secondary | ICD-10-CM

## 2021-06-24 ENCOUNTER — Encounter (HOSPITAL_COMMUNITY): Payer: Self-pay | Admitting: *Deleted

## 2021-06-24 ENCOUNTER — Emergency Department (HOSPITAL_COMMUNITY)
Admission: EM | Admit: 2021-06-24 | Discharge: 2021-06-24 | Disposition: A | Discharge status: Home / Self Care | Payer: Medicare HMO | Attending: Emergency Medicine | Admitting: Emergency Medicine

## 2021-06-24 DIAGNOSIS — D649 Anemia, unspecified: Secondary | ICD-10-CM | POA: Insufficient documentation

## 2021-06-24 DIAGNOSIS — Z8543 Personal history of malignant neoplasm of ovary: Secondary | ICD-10-CM | POA: Diagnosis not present

## 2021-06-24 DIAGNOSIS — Z79899 Other long term (current) drug therapy: Secondary | ICD-10-CM | POA: Diagnosis not present

## 2021-06-24 DIAGNOSIS — Z992 Dependence on renal dialysis: Secondary | ICD-10-CM | POA: Insufficient documentation

## 2021-06-24 DIAGNOSIS — I132 Hypertensive heart and chronic kidney disease with heart failure and with stage 5 chronic kidney disease, or end stage renal disease: Secondary | ICD-10-CM | POA: Insufficient documentation

## 2021-06-24 DIAGNOSIS — I2510 Atherosclerotic heart disease of native coronary artery without angina pectoris: Secondary | ICD-10-CM | POA: Insufficient documentation

## 2021-06-24 DIAGNOSIS — N186 End stage renal disease: Secondary | ICD-10-CM | POA: Insufficient documentation

## 2021-06-24 DIAGNOSIS — Z8616 Personal history of COVID-19: Secondary | ICD-10-CM | POA: Diagnosis not present

## 2021-06-24 DIAGNOSIS — E1122 Type 2 diabetes mellitus with diabetic chronic kidney disease: Secondary | ICD-10-CM | POA: Diagnosis not present

## 2021-06-24 DIAGNOSIS — I5033 Acute on chronic diastolic (congestive) heart failure: Secondary | ICD-10-CM | POA: Insufficient documentation

## 2021-06-24 DIAGNOSIS — E039 Hypothyroidism, unspecified: Secondary | ICD-10-CM | POA: Insufficient documentation

## 2021-06-24 DIAGNOSIS — Z7982 Long term (current) use of aspirin: Secondary | ICD-10-CM | POA: Diagnosis not present

## 2021-06-24 DIAGNOSIS — D631 Anemia in chronic kidney disease: Secondary | ICD-10-CM | POA: Diagnosis not present

## 2021-06-24 DIAGNOSIS — D539 Nutritional anemia, unspecified: Secondary | ICD-10-CM

## 2021-06-24 DIAGNOSIS — Z85038 Personal history of other malignant neoplasm of large intestine: Secondary | ICD-10-CM | POA: Diagnosis not present

## 2021-06-24 DIAGNOSIS — Z955 Presence of coronary angioplasty implant and graft: Secondary | ICD-10-CM | POA: Insufficient documentation

## 2021-06-24 LAB — COMPREHENSIVE METABOLIC PANEL
ALT: 13 U/L (ref 0–44)
AST: 11 U/L — ABNORMAL LOW (ref 15–41)
Albumin: 3.7 g/dL (ref 3.5–5.0)
Alkaline Phosphatase: 118 U/L (ref 38–126)
Anion gap: 12 (ref 5–15)
BUN: 36 mg/dL — ABNORMAL HIGH (ref 8–23)
CO2: 25 mmol/L (ref 22–32)
Calcium: 9.1 mg/dL (ref 8.9–10.3)
Chloride: 100 mmol/L (ref 98–111)
Creatinine, Ser: 6.59 mg/dL — ABNORMAL HIGH (ref 0.44–1.00)
GFR, Estimated: 6 mL/min — ABNORMAL LOW (ref >60)
Glucose, Bld: 89 mg/dL (ref 70–99)
Potassium: 3.7 mmol/L (ref 3.5–5.1)
Sodium: 137 mmol/L (ref 135–145)
Total Bilirubin: 0.5 mg/dL (ref 0.3–1.2)
Total Protein: 7 g/dL (ref 6.5–8.1)

## 2021-06-24 LAB — CBC
HCT: 26.1 % — ABNORMAL LOW (ref 36.0–46.0)
Hemoglobin: 7.7 g/dL — ABNORMAL LOW (ref 12.0–15.0)
MCH: 38.3 pg — ABNORMAL HIGH (ref 26.0–34.0)
MCHC: 29.5 g/dL — ABNORMAL LOW (ref 30.0–36.0)
MCV: 129.9 fL — ABNORMAL HIGH (ref 80.0–100.0)
Platelets: 273 10*3/uL (ref 150–400)
RBC: 2.01 MIL/uL — ABNORMAL LOW (ref 3.87–5.11)
RDW: 18.5 % — ABNORMAL HIGH (ref 11.5–15.5)
WBC: 6.2 10*3/uL (ref 4.0–10.5)
nRBC: 0 % (ref 0.0–0.2)

## 2021-06-24 LAB — TYPE AND SCREEN
ABO/RH(D): O POS
Antibody Screen: NEGATIVE

## 2021-06-24 MED ORDER — FERROUS SULFATE 325 (65 FE) MG PO TABS: 325.0000 mg | ORAL_TABLET | Freq: Every day | ORAL | 0 refills | Status: DC

## 2021-06-24 NOTE — Discharge Instructions (Addendum)
Lab work shows that you have a low hemoglobin,  starting you on an iron pill please take as prescribed.    I would like you to follow-up with your primary care provider and/or talked with your nephrologist for further management of your anemia.  Come back to the emergency department if you develop chest pain, shortness of breath, severe abdominal pain, uncontrolled nausea, vomiting, diarrhea.

## 2021-06-24 NOTE — ED Triage Notes (Signed)
Referred from her pcp for possible blood transfusion, patient has esrd

## 2021-06-24 NOTE — ED Provider Notes (Signed)
[facility] Provider Note   CSN: [id number] Arrival date & time: 06/24/21  1620     History No chief complaint on file.   Shawna [name] is a 80 y.o. female.  HPI  Patient with medical history including CAD 2013 most recent cath 2020 occlusion noted currently medically managed, CHF, colon cancer, end-stage renal disease currently on hemodialysis Monday Wednesday Friday presents the emergency department with concerns of low hemoglobin.  Patient states that she saw her PCP today who did a finger stick hemoglobin which read 6.6 and she was sent here for a blood transfusion.  Patient states that she has no complaints this time, denies feeling lightheaded, dizziness, fatigue, denies melena or hematochezia, she does not make urine, denies hematemesis or coffee-ground emesis, denies any abnormal bleeding, she is currently not on anticoagulant this time.  She has no other complaints, she states that she got her most recent dialysis, denies worsening chest pain, shortness of breath, orthopnea, pedal edema.  Patient is here today because she just wants to make sure that she does not need a blood transfusion.  Past Medical History:  Diagnosis Date   Acute on chronic respiratory failure with hypoxia ([place]) 10/10/2016   Anxiety    Arthritis    AVM (arteriovenous malformation) of colon    CAD (coronary artery disease)    a. s/p CABG in 2013 b. DES to D1 in 10/2016. c. cath in 07/2018 showing patent grafts with occlusion of D1 at prior stent site and progression of PDA disease --> medical management recommended   Carotid artery disease ([place])    a. 40-34% LICA, 01/4258    Chronic anemia    Chronic bronchitis (HCC)    Chronic diastolic CHF (congestive heart failure) ([place])    a. 02/2012 Echo EF 60-65%, nl wall motion, Gr 1 DD, mod MR   Colon cancer ([place]) 1992   Esophageal stricture    ESRD on hemodialysis ([place])    ESRD due to HTN, started dialysis 2011 and gets HD [facility]  with Dr Hinda Lenis on MWF schedule.  Access is LUA AVF as of Sept 2014.    GERD (gastroesophageal reflux disease)    High cholesterol 12/2011   History of blood transfusion 07/2011; 12/2011; 01/2012 X 2; 04/2012   History of gout    History of lower GI bleeding    Hypertension    Iron deficiency anemia    Jugular vein occlusion, right (HCC)    Mitral regurgitation    a. Moderate by echo, 02/2012   Myocardial infarction [facility])    NSVT (nonsustained ventricular tachycardia)    Ovarian cancer ([place]) 1992   PAF (paroxysmal atrial fibrillation) ([place])    Pneumonia ~ 2009   PUD (peptic ulcer disease)    TIA (transient ischemic attack)     Patient Active Problem List   Diagnosis Date Noted   Atrial fibrillation ([place]) 05/07/2021   Vitamin B deficiency 04/23/2021   Neuropathy 04/23/2021   Anemia due to chronic blood loss 04/13/2021   Left knee pain 03/15/2021   Moderate protein-calorie malnutrition ([place]) 02/27/2021   Hypokalemia 02/27/2021   COVID-19 virus infection 02/03/2021   Leukocytosis 02/03/2021   Elevated MCV 02/03/2021   Hypoalbuminemia due to protein-calorie malnutrition ([place]) 02/03/2021   Hypothyroidism (acquired) 02/03/2021   Myositis 12/03/2020   Ataxia 12/02/2020   Diabetes mellitus type 2 in nonobese ([place]) 11/10/2020   Acute pulmonary edema ([employer]) 11/10/2020   Generalized weakness 11/09/2020   Dialysis AV fistula  malfunction, initial encounter ([place])    Jugular vein occlusion, right ([place])    Failure of surgically constructed arteriovenous fistula ([place]) 10/03/2020   Myoclonus 08/31/2020   Clotted renal dialysis AV graft, initial encounter ([place])    Hypertensive heart and chronic kidney disease with heart failure and stage 1 through stage 4 chronic kidney disease, or chronic kidney disease ([place])    Hemodialysis-associated hypotension    Acute hypoxemic respiratory failure ([place]) 06/14/2020   Hypertension 06/12/2020   Irritable bowel syndrome 02/25/2020   Adenomatous duodenal  polyp 09/10/2019   History of GI bleed 09/10/2019   Angina pectoris ([place]) 06/05/2019   Chest pain 06/03/2019   Small intestinal bacterial overgrowth 05/14/2019   Iron deficiency anemia 04/02/2019   Acute respiratory failure with hypoxia ([place]) 12/25/2018   Elevated troponin 12/14/2018   Chest pain at rest 07/13/2018   Hand steal syndrome ([place]) 08/01/2017   Anemia 07/14/2017   Coronary artery disease 06/05/2017   Mesenteric ischemia (HCC)    Diverticulitis    Chronic diarrhea    Complication of vascular access for dialysis 03/19/2017   Preoperative clearance 01/25/2017   H/O non-ST elevation myocardial infarction (NSTEMI) 10/24/2016   Fluid overload 33/83/2919   Complication from renal dialysis device 10/10/2016   Non-ST elevation MI (NSTEMI) ([place])    Encounter for fitting and adjustment of vascular catheter    Heme positive stool    Demand ischemia ([place]) 07/27/2016   Hypertensive emergency 07/08/2016   Acute on chronic respiratory failure with hypoxia [facility])    Cardiac arrest [facility]    Palliative care encounter    Goals of care, counseling/discussion    Hypertensive crisis without congestive heart failure 05/09/2016   Flash pulmonary edema ([place]) 04/06/2016   Acute respiratory failure ([place]) 04/06/2016   Hypertensive crisis 01/27/2016   History of colon cancer 01/27/2016   History of ovarian cancer 01/27/2016   Hypertensive urgency 01/27/2016   Paroxysmal atrial fibrillation ([place]) 10/14/2015   Coronary angioplasty status 10/14/2015   Malignant neoplasm of right ovary ([place]) 10/14/2015   Narrow complex tachycardia ([place]) 09/08/2015   SVT (supraventricular tachycardia) ([place]) 09/08/2015   Influenza A 08/30/2015   Acute on chronic diastolic CHF (congestive heart failure) ([place]) 05/04/2015   Unstable angina ([place]) 05/03/2015   DOE (dyspnea on exertion)    Essential hypertension    Hyponatremia 10/01/2014   Pain in joint, lower leg 08/14/2014   Dacryocystitis 05/29/2013   Chronic  diastolic CHF (congestive heart failure) ([place]) 03/22/2013   Acute post-hemorrhagic anemia 03/21/2013   Occlusion and stenosis of carotid artery without mention of cerebral infarction 01/24/2013   Hx of CABG 07/05/2012   Carotid artery disease ([place]) 07/05/2012   Mitral regurgitation 06/12/2012   Pneumonia 06/09/2012   Non-STEMI (non-ST elevated myocardial infarction) ([place]) 06/08/2012   Ischemic chest pain ([place]) 03/01/2012   AVM (arteriovenous malformation) of small bowel, acquired 01/20/2012   GERD (gastroesophageal reflux disease) 01/09/2012   HLD (hyperlipidemia) 01/05/2012   Atherosclerotic heart disease of native coronary artery without angina pectoris 12/16/2011   Essential hypertension, benign 12/16/2011   ESRD on hemodialysis ([place]) 12/16/2011   Anxiety disorder 05/04/2011   Anemia in chronic kidney disease 04/29/2011   ESRD (end stage renal disease) on dialysis ([place]) 04/29/2011   Gout 04/29/2011   Hypertensive chronic kidney disease with stage 5 chronic kidney disease or end stage renal disease ([place]) 04/29/2011    Past Surgical History:  Procedure Laterality Date   A/V SHUNTOGRAM Left 03/19/2019   Procedure: A/V SHUNTOGRAM;  Surgeon: Katha Cabal, MD;  Location: [place] CV LAB;  Service: Cardiovascular;  Laterality: Left;   ABDOMINAL HYSTERECTOMY  1992   APPENDECTOMY  06/1990   AV FISTULA PLACEMENT  07/2009   left upper arm   AV FISTULA PLACEMENT Right 09/06/2016   Procedure: RIGHT FOREARM ARTERIOVENOUS (AV) GRAFT;  Surgeon: Elam Dutch, MD;  Location: [place];  Service: Vascular;  Laterality: Right;   AV FISTULA PLACEMENT N/A 02/24/2017   Procedure: INSERTION OF ARTERIOVENOUS (AV) GORE-TEX GRAFT ARM (BRACHIAL AXILLARY);  Surgeon: Katha Cabal, MD;  Location: [facility] ORS;  Service: Vascular;  Laterality: N/A;   [place] Right 09/06/2016   Procedure: REMOVAL OF Right Arm ARTERIOVENOUS GORETEX GRAFT and Vein Patch angioplasty of brachial artery;  Surgeon:  Angelia Mould, MD;  Location: [place];  Service: Vascular;  Laterality: Right;   BIOPSY  09/26/2019   Procedure: BIOPSY;  Surgeon: Rogene Houston, MD;  [facility];  Service: Endoscopy;;   COLON RESECTION  1992   COLON SURGERY     COLONOSCOPY N/A 03/09/2019   Procedure: COLONOSCOPY;  Surgeon: Rogene Houston, MD;  [facility];  Service: Endoscopy;  Laterality: N/A;   COLONOSCOPY WITH PROPOFOL N/A 06/08/2021   Procedure: COLONOSCOPY WITH PROPOFOL;  Surgeon: Harvel Quale, MD;  [facility];  Service: Gastroenterology;  Laterality: N/A;  9:05 /Patient is on dialysis Mon Wed Fri   CORONARY ANGIOPLASTY WITH STENT PLACEMENT  12/15/11   "2"   CORONARY ANGIOPLASTY WITH STENT PLACEMENT  y/2013   "1; makes total of 3" (05/02/2012)   CORONARY ARTERY BYPASS GRAFT  06/13/2012   Procedure: CORONARY ARTERY BYPASS GRAFTING (CABG);  Surgeon: Grace Isaac, MD;  Location: [place];  Service: Open Heart Surgery;  Laterality: N/A;  cabg x four;  using left internal mammary artery, and left leg greater saphenous vein harvested endoscopically   CORONARY STENT INTERVENTION N/A 10/13/2016   Procedure: Coronary Stent Intervention;  Surgeon: Troy Sine, MD;  Location: [place] CV LAB;  Service: Cardiovascular;  Laterality: N/A;   DIALYSIS/PERMA CATHETER REMOVAL N/A 04/18/2017   Procedure: DIALYSIS/PERMA CATHETER REMOVAL;  Surgeon: Katha Cabal, MD;  Location: [place] [facility] LAB;  Service: Cardiovascular;  Laterality: N/A;   DILATION AND CURETTAGE OF UTERUS     ENTEROSCOPY N/A 06/08/2021   Procedure: PUSH ENTEROSCOPY;  Surgeon: Harvel Quale, MD;  [facility];  Service: Gastroenterology;  Laterality: N/A;   ESOPHAGOGASTRODUODENOSCOPY  01/20/2012   Procedure: ESOPHAGOGASTRODUODENOSCOPY (EGD);  Surgeon: Ladene Artist, MD,FACG;  Location: [facility] ENDOSCOPY;  Service: Endoscopy;  Laterality: N/A;   ESOPHAGOGASTRODUODENOSCOPY N/A 03/26/2013    Procedure: ESOPHAGOGASTRODUODENOSCOPY (EGD);  Surgeon: Irene Shipper, MD;  Location[facility] ENDOSCOPY;  Service: Endoscopy;  Laterality: N/A;   ESOPHAGOGASTRODUODENOSCOPY N/A 04/30/2015   Procedure: ESOPHAGOGASTRODUODENOSCOPY (EGD);  Surgeon: Rogene Houston, MD;  [facility];  Service: Endoscopy;  Laterality: N/A;  1pm - moved to 10/20 @ 1:10   ESOPHAGOGASTRODUODENOSCOPY N/A 07/29/2016   Procedure: ESOPHAGOGASTRODUODENOSCOPY (EGD);  Surgeon: Manus Gunning, MD;  Location: [place];  Service: Gastroenterology;  Laterality: N/A;  enteroscopy   ESOPHAGOGASTRODUODENOSCOPY N/A 09/26/2019   Procedure: ESOPHAGOGASTRODUODENOSCOPY (EGD);  Surgeon: Rogene Houston, MD;  [facility];  Service: Endoscopy;  Laterality: N/A;  1250   ESOPHAGOGASTRODUODENOSCOPY (EGD) WITH PROPOFOL N/A 02/05/2021   Procedure: ESOPHAGOGASTRODUODENOSCOPY (EGD) WITH PROPOFOL;  Surgeon: Eloise Harman, DO;  [facility];  Service: Endoscopy;  Laterality: N/A;   GIVENS CAPSULE STUDY N/A 03/07/2019   Procedure: GIVENS CAPSULE STUDY;  Surgeon: Rogene Houston, MD;  [facility];  Service: Endoscopy;  Laterality: N/A;  7:30   GIVENS CAPSULE STUDY N/A 04/22/2021   Procedure: GIVENS CAPSULE STUDY;  Surgeon: Rogene Houston, MD;  [facility];  Service: Endoscopy;  Laterality: N/A;  7:30   INSERTION OF DIALYSIS CATHETER N/A 10/05/2020   Procedure: ABORTED TUNNELED DIALYSIS CATHETER PLACEMENT RIGHT INTERNAL JUGULAR VEIN ;  Surgeon: Virl Cagey, MD;  Location: [facility];  Service: General;  Laterality: N/A;   INTRAOPERATIVE TRANSESOPHAGEAL ECHOCARDIOGRAM  06/13/2012   Procedure: INTRAOPERATIVE TRANSESOPHAGEAL ECHOCARDIOGRAM;  Surgeon: Grace Isaac, MD;  Location: [place];  Service: Open Heart Surgery;  Laterality: N/A;   IR DIALY SHUNT INTRO NEEDLE/INTRACATH INITIAL W/IMG LEFT Left 10/06/2020   IR FLUORO GUIDE CV LINE RIGHT  06/17/2020   IR GENERIC HISTORICAL  07/26/2016    IR FLUORO GUIDE CV LINE RIGHT 07/26/2016 Greggory Keen, MD [facility]   IR GENERIC HISTORICAL  07/26/2016   IR US GUIDE VASC ACCESS RIGHT 07/26/2016 Greggory Keen, MD [facility]   IR GENERIC HISTORICAL  08/02/2016   IR US GUIDE VASC ACCESS RIGHT 08/02/2016 Greggory Keen, MD [facility]   IR GENERIC HISTORICAL  08/02/2016   IR FLUORO GUIDE CV LINE RIGHT 08/02/2016 Greggory Keen, MD [facility]   IR RADIOLOGY PERIPHERAL GUIDED IV START  03/28/2017   IR REMOVAL TUN CV CATH W/O FL  08/11/2020   IR THROMBECTOMY AV FISTULA W/THROMBOLYSIS INC/SHUNT/IMG LEFT Left 06/17/2020   IR US GUIDE VASC ACCESS LEFT  06/17/2020   IR US GUIDE VASC ACCESS RIGHT  03/28/2017   IR US GUIDE VASC ACCESS RIGHT  06/17/2020   LEFT HEART CATH AND CORONARY ANGIOGRAPHY N/A 09/20/2016   Procedure: Left Heart Cath and Coronary Angiography;  Surgeon: [name], MD;  Location: [employer] CV LAB;  Service: Cardiovascular;  Laterality: N/A;   LEFT HEART CATH AND CORS/GRAFTS ANGIOGRAPHY N/A 10/13/2016   Procedure: Left Heart Cath and Cors/Grafts Angiography;  Surgeon: Troy Sine, MD;  Location: [place] CV LAB;  Service: Cardiovascular;  Laterality: N/A;   LEFT HEART CATH AND CORS/GRAFTS ANGIOGRAPHY N/A 07/13/2018   Procedure: LEFT HEART CATH AND CORS/GRAFTS ANGIOGRAPHY;  Surgeon: Martinique, Peter M, MD;  Location: [place] [facility] LAB;  Service: Cardiovascular;  Laterality: N/A;   LEFT HEART CATHETERIZATION WITH CORONARY ANGIOGRAM N/A 12/15/2011   Procedure: LEFT HEART CATHETERIZATION WITH CORONARY ANGIOGRAM;  Surgeon: Burnell Blanks, MD;  Location: [facility] CATH LAB;  Service: Cardiovascular;  Laterality: N/A;   LEFT HEART CATHETERIZATION WITH CORONARY ANGIOGRAM N/A 01/10/2012   Procedure: LEFT HEART CATHETERIZATION WITH CORONARY ANGIOGRAM;  Surgeon: Peter M Martinique, MD;  Location: [facility] CATH LAB;  Service: Cardiovascular;  Laterality: N/A;   LEFT HEART CATHETERIZATION WITH CORONARY ANGIOGRAM N/A 06/08/2012   Procedure: LEFT HEART  CATHETERIZATION WITH CORONARY ANGIOGRAM;  Surgeon: Burnell Blanks, MD;  Location: [facility] CATH LAB;  Service: Cardiovascular;  Laterality: N/A;   LEFT HEART CATHETERIZATION WITH CORONARY/GRAFT ANGIOGRAM N/A 12/10/2013   Procedure: LEFT HEART CATHETERIZATION WITH Beatrix Fetters;  Surgeon: [name], MD;  Location: [facility] CATH LAB;  Service: Cardiovascular;  Laterality: N/A;   OVARY SURGERY     ovarian cancer   POLYPECTOMY  03/09/2019   Procedure: POLYPECTOMY;  Surgeon: Rogene Houston, MD;  [facility];  Service: Endoscopy;;  cecal    POLYPECTOMY N/A 09/26/2019  Procedure: DUODENAL POLYPECTOMY;  Surgeon: Rogene Houston, MD;  [facility];  Service: Endoscopy;  Laterality: N/A;   REVISION OF ARTERIOVENOUS GORETEX GRAFT N/A 02/24/2017   Procedure: REVISION OF ARTERIOVENOUS GORETEX GRAFT (RESECTION);  Surgeon: Katha Cabal, MD;  Location: [facility] ORS;  Service: Vascular;  Laterality: N/A;   REVISON OF ARTERIOVENOUS FISTULA Left 06/19/2020   Procedure: REVISION OF LEFT UPPER ARM AV GRAFT WITH INTERPOSITION JUMP GRAFT USING 6MM GORE LIMB;  Surgeon: Marty Heck, MD;  Location: [place];  Service: Vascular;  Laterality: Left;   SHUNTOGRAM N/A 10/15/2013   Procedure: Fistulogram;  Surgeon: Serafina Mitchell, MD;  Location: [facility] CATH LAB;  Service: Cardiovascular;  Laterality: N/A;   THROMBECTOMY / ARTERIOVENOUS GRAFT REVISION  2011   left upper arm   TUBAL LIGATION  1980's   UPPER EXTREMITY ANGIOGRAPHY Bilateral 12/06/2016   Procedure: Upper Extremity Angiography;  Surgeon: Katha Cabal, MD;  Location: [place] CV LAB;  Service: Cardiovascular;  Laterality: Bilateral;   UPPER EXTREMITY INTERVENTION Left 06/06/2017   Procedure: UPPER EXTREMITY INTERVENTION;  Surgeon: Katha Cabal, MD;  Location: [place] CV LAB;  Service: Cardiovascular;  Laterality: Left;     OB History     Gravida  2   Para  2   Term      Preterm  2   AB       Living  2      SAB      IAB      Ectopic      Multiple      Live Births              Family History  Problem Relation Age of Onset   Heart disease Mother        Heart Disease before age 71   Hyperlipidemia Mother    Hypertension Mother    Diabetes Mother    Heart attack Mother    Heart disease Father        Heart Disease before age 43   Hyperlipidemia Father    Hypertension Father    Diabetes Father    Diabetes Sister    Hypertension Sister    Diabetes Brother    Hyperlipidemia Brother    Heart attack Brother    Hypertension Sister    Heart attack Brother    Colon cancer Child 84   Other Other        noncontributory for early CAD   Esophageal cancer Neg Hx    Liver disease Neg Hx    Kidney disease Neg Hx    Colon polyps Neg Hx     Social History   Tobacco Use   Smoking status: Never   Smokeless tobacco: Never  Vaping Use   Vaping Use: Never used  Substance Use Topics   Alcohol use: No    Alcohol/week: 0.0 standard drinks   Drug use: No    Home Medications Prior to Admission medications   Medication Sig Start Date End Date Taking? Authorizing Provider  ferrous sulfate 325 (65 FE) MG tablet Take 1 tablet (325 mg total) by mouth daily. 06/24/21  Yes Marcello Fennel, PA-C  ALPRAZolam Duanne Moron) 0.5 MG tablet Take 0.5 mg by mouth at bedtime. 03/20/21   [provider]  amiodarone (PACERONE) 200 MG tablet Take 1 tablet (200 mg total) by mouth daily. 05/13/21   Strader, Fransisco Hertz, PA-C  aspirin EC 81 MG tablet Take 81 mg by mouth daily.  09/27/19   Rehman, Mechele Dawley, MD  benzonatate (TESSALON) 100 MG capsule Take 100 mg by mouth 3 (three) times daily as needed for cough. 04/30/21   [provider]  cyclobenzaprine (FLEXERIL) 5 MG tablet Take 5 mg by mouth 3 (three) times daily as needed for muscle spasms.    [provider]  diclofenac Sodium (VOLTAREN) 1 % GEL Apply 2 g topically 4 (four) times daily as needed. 03/12/21   Long,  Wonda Olds, MD  fluticasone (FLONASE) 50 MCG/ACT nasal spray Place 1 spray into both nostrils at bedtime as needed for allergies.     [provider]  gabapentin (NEURONTIN) 100 MG capsule Take 100 mg by mouth at bedtime.    [provider]  hydrOXYzine (VISTARIL) 100 MG capsule Take 100 mg by mouth in the morning and at bedtime. 11/23/20   [provider]  isosorbide mononitrate (IMDUR) 60 MG 24 hr tablet Take 1.5 tablets (90 mg total) by mouth 2 (two) times daily. 06/22/21   Arnoldo Lenis, MD  levothyroxine (SYNTHROID) 25 MCG tablet Take 1 tablet (25 mcg total) by mouth daily. 02/02/21   Brita Romp, NP  lidocaine-prilocaine (EMLA) cream Apply 1 application topically every Monday, Wednesday, and Friday. Prior to dialysis    [provider]  loperamide (IMODIUM A-D) 2 MG tablet Take 2 tablets (4 mg total) by mouth daily as needed for diarrhea or loose stools. 12/17/20   Thurnell Lose, MD  metoprolol tartrate (LOPRESSOR) 50 MG tablet Take 1 tablet (50 mg total) by mouth 2 (two) times daily. 05/13/21   Ahmed Prima, Fransisco Hertz, PA-C  midodrine (PROAMATINE) 5 MG tablet Take Monday, Wednesday, Friday morning BEFORE dialysis 12/22/20   Arnoldo Lenis, MD  multivitamin (RENA-VIT) TABS tablet Take 1 tablet by mouth daily.    [provider]  Na Sulfate-K Sulfate-Mg Sulf (SUPREP BOWEL PREP KIT) 17.5-3.13-1.6 GM/177ML SOLN Take 1 kit by mouth as directed. 06/11/21   Harvel Quale, MD  nitroGLYCERIN (NITROSTAT) 0.4 MG SL tablet Place 1 tablet (0.4 mg total) under the tongue every 5 (five) minutes x 3 doses as needed for chest pain (if no relief after 2nd dose, proceed to the ED for an evaluation or call 911). 02/19/21   Verta Ellen., NP  omeprazole (PRILOSEC) 20 MG capsule Take 1 capsule (20 mg total) by mouth daily. Patient taking differently: Take 10 mg by mouth daily. 12/01/20   Rehman, Mechele Dawley, MD  rosuvastatin (CRESTOR) 10 MG tablet Take  1 tablet (10 mg total) by mouth daily. 05/13/21   Strader, Fransisco Hertz, PA-C  sevelamer carbonate (RENVELA) 800 MG tablet Take 2 tablets (1,600 mg total) by mouth 3 (three) times daily with meals. Patient taking differently: Take 2,400 mg by mouth 3 (three) times daily with meals. 06/20/20   Meccariello, Bernita Raisin, DO    Allergies    Amlodipine, Aspirin, Nitrofurantoin, Penicillins, Bactrim [sulfamethoxazole-trimethoprim], Contrast media [iodinated diagnostic agents], Iron, Tylenol [acetaminophen], Gabapentin, Iron sucrose, No known allergies, Ranexa [ranolazine], Sucroferric oxyhydroxide, Clopidogrel, Dexilant [dexlansoprazole], Hydralazine, Levaquin [levofloxacin in d5w], Levofloxacin, Morphine, Morphine and related, Pantoprazole, Plavix [clopidogrel bisulfate], Protonix [pantoprazole sodium], and Venofer [ferric oxide]  Review of Systems   Review of Systems  Constitutional:  Negative for chills and fever.  HENT:  Negative for congestion.   Respiratory:  Negative for shortness of breath.   Cardiovascular:  Negative for chest pain.  Gastrointestinal:  Negative for abdominal pain, anal bleeding and blood in stool.  Genitourinary:  Negative for enuresis.  Musculoskeletal:  Negative for back pain.  Skin:  Negative for rash.  Neurological:  Negative for dizziness.  Hematological:  Does not bruise/bleed easily.   Physical Exam Updated Vital Signs BP (!) 175/50    Pulse 70    Temp 97.9 F (36.6 C) (Oral)    Resp 20    SpO2 97%   Physical Exam Vitals and nursing note reviewed. Exam conducted with a chaperone present.  Constitutional:      General: She is not in acute distress.    Appearance: She is not ill-appearing.  HENT:     Head: Normocephalic and atraumatic.     Nose: No congestion.  Eyes:     Conjunctiva/sclera: Conjunctivae normal.  Cardiovascular:     Rate and Rhythm: Normal rate and regular rhythm.     Pulses: Normal pulses.     Heart sounds: No murmur heard.   No friction  rub. No gallop.  Pulmonary:     Effort: No respiratory distress.     Breath sounds: No wheezing, rhonchi or rales.  Abdominal:     Palpations: Abdomen is soft.     Tenderness: There is no abdominal tenderness. There is no right CVA tenderness or left CVA tenderness.  Genitourinary:    Comments: Rectal exam was performed small external hemorrhoids present, no other gross hemorrhoids present, no palpated internal hemorrhoids present or other gross abnormalities, no melena on my exam, Hemoccult is negative. Musculoskeletal:     Right lower leg: No edema.     Left lower leg: No edema.     Comments: Fistula noted in the left arm, good palpable thrill pulse.  Skin:    General: Skin is warm and dry.  Neurological:     Mental Status: She is alert.  Psychiatric:        Mood and Affect: Mood normal.    ED Results / Procedures / Treatments   Labs (all labs ordered are listed, but only abnormal results are displayed) Labs Reviewed  COMPREHENSIVE METABOLIC PANEL - Abnormal; Notable for the following components:      Result Value   BUN 36 (*)    Creatinine, Ser 6.59 (*)    AST 11 (*)    GFR, Estimated 6 (*)    All other components within normal limits  CBC - Abnormal; Notable for the following components:   RBC 2.01 (*)    Hemoglobin 7.7 (*)    HCT 26.1 (*)    MCV 129.9 (*)    MCH 38.3 (*)    MCHC 29.5 (*)    RDW 18.5 (*)    All other components within normal limits  TYPE AND SCREEN    EKG None  Radiology No results found.  Procedures Procedures   Medications Ordered in ED Medications - No data to display  ED Course  I have reviewed the triage vital signs and the nursing notes.  Pertinent labs & imaging results that were available during my care of the patient were reviewed by me and considered in my medical decision making (see chart for details).    MDM Rules/Calculators/A&P                         Initial impression-presents with concerns of low hemoglobin, she is  alert, no acute stress, ultrasound reassuring.  Triage obtained basic lab work-up, will add on Hemoccult and reassess.  Work-up-CBC shows macrocytic anemia hemoglobin 7.7, from 2 weeks ago it  was 9.5, CMP shows a BUN of 36, creatinine 6.5, Hemoccult negative.   Rule out-low suspicion for systemic infection as patient nontoxic-appearing, vital signs reassuring no acute chest pain on CBC.  I have low suspicion for upper and/or lower GI bleed as she denies hematemesis or coffee-ground emesis, denies melena or hematochezia, Hemoccult is negative.  Of note she does have a decrease in her hemoglobin but I suspect this is likely secondary due to chronic diseases,  looking through her chart patient is generally around 8 -9.  Low suspicion for emergent dialysis she is not volume overloaded, no shortness of breath, no severe electrolyte derailments present.  Plan-  Macrocytic anemia-likely secondary to chronic diseases, will start her on iron as appears she is not taking this currently, will have her follow-up with her PCP and/or nephrologist for further evaluation.  Gave strict return precautions.  Vital signs have remained stable, no indication for hospital admission.  Patient discussed with attending and they agreed with assessment and plan.  Patient given at home care as well strict return precautions.  Patient verbalized that they understood agreed to said plan.     Final Clinical Impression(s) / ED Diagnoses Final diagnoses:  Macrocytic anemia    Rx / DC Orders ED Discharge Orders          Ordered    ferrous sulfate 325 (65 FE) MG tablet  Daily        06/24/21 1946             Aron Baba 06/24/21 1951    Sherwood Gambler, MD 06/25/21 2222

## 2021-06-28 ENCOUNTER — Telehealth (INDEPENDENT_AMBULATORY_CARE_PROVIDER_SITE_OTHER): Payer: Self-pay | Admitting: *Deleted

## 2021-06-28 NOTE — Telephone Encounter (Signed)
Pt left voicemail that states triamcinolone ointment is not helping the spot on her buttock. I called patient back to get more information because triamcinolone was not prescribed by any provider at this office. She states her primary care gave it to her for a spot that itches on her bottom. Advised pt to call her pcp and let him know since he was the prescriber and she has not been seen here for that issue. She verbalized understanding and states she will call him.

## 2021-06-30 ENCOUNTER — Telehealth (INDEPENDENT_AMBULATORY_CARE_PROVIDER_SITE_OTHER): Payer: Self-pay | Admitting: *Deleted

## 2021-06-30 NOTE — Telephone Encounter (Signed)
Pt left voicemail that she is having dark green stools since drinking dye for a xray. States her stomach is not right since then. She is taking imodium and not helping.  Patient last seen 05/20/21. I called to get more information and no answer and no voicemail set up.  [phone]

## 2021-07-01 ENCOUNTER — Telehealth (INDEPENDENT_AMBULATORY_CARE_PROVIDER_SITE_OTHER): Payer: Self-pay | Admitting: *Deleted

## 2021-07-01 NOTE — Telephone Encounter (Signed)
She was complaining of diarrhea back in her last visit in November.  I ordered some labs but I do not see the results in the system.  Did she perform this?  If not, she will need to proceed with this as well.  She is scheduled for a colonoscopy to evaluate her intermittent episodes of diarrhea in January (will need to have a PICC line placed which she is already scheduled for).  She will continue taking Imodium for now but will need to proceed with the labs as ordered in the past.  The color of the stool is not concerning at the moment.

## 2021-07-01 NOTE — Telephone Encounter (Signed)
Called pt back and she states she had been having dark green stools since drinking dye for an xray about 3 weeks ago. States no blood in stool and not black stool, just dark green, no fever, a little abdominal pain from time to time when lying in bed on left upper side, states she has to have a BM 3 -5 times per day. Always goes after eating, stool is soft and not loose. Taking 2-3 imodium a day. No nausea, no vomiting. Pt last seen 05/20/21.

## 2021-07-01 NOTE — Telephone Encounter (Signed)
Called and discussed with pt per Dr. Jenetta Downer - She was complaining of diarrhea back in her last visit in November.  I ordered some labs but I do not see the results in the system.  Did she perform this?  If not, she will need to proceed with this as well.  She is scheduled for a colonoscopy to evaluate her intermittent episodes of diarrhea in January (will need to have a PICC line placed which she is already scheduled for).  She will continue taking Imodium for now but will need to proceed with the labs as ordered in the past.  The color of the stool is not concerning at the moment.  Pt states she does not think she will do bloodwork today but will try to go [facility]. [facility] to [facility] since our office is closed tomorrow and patient will need the paper order. Patient aware she can just go over to lab.

## 2021-07-06 ENCOUNTER — Emergency Department (HOSPITAL_COMMUNITY): Payer: Medicare HMO

## 2021-07-06 ENCOUNTER — Observation Stay (HOSPITAL_COMMUNITY)
Admission: EM | Admit: 2021-07-06 | Discharge: 2021-07-08 | Disposition: A | Discharge status: Home / Self Care | Payer: Medicare HMO | Attending: Internal Medicine | Admitting: Internal Medicine

## 2021-07-06 ENCOUNTER — Other Ambulatory Visit: Payer: Self-pay

## 2021-07-06 DIAGNOSIS — Z992 Dependence on renal dialysis: Secondary | ICD-10-CM | POA: Diagnosis not present

## 2021-07-06 DIAGNOSIS — N186 End stage renal disease: Secondary | ICD-10-CM | POA: Diagnosis not present

## 2021-07-06 DIAGNOSIS — I132 Hypertensive heart and chronic kidney disease with heart failure and with stage 5 chronic kidney disease, or end stage renal disease: Secondary | ICD-10-CM | POA: Diagnosis not present

## 2021-07-06 DIAGNOSIS — Z7982 Long term (current) use of aspirin: Secondary | ICD-10-CM | POA: Insufficient documentation

## 2021-07-06 DIAGNOSIS — I5033 Acute on chronic diastolic (congestive) heart failure: Secondary | ICD-10-CM | POA: Insufficient documentation

## 2021-07-06 DIAGNOSIS — E1122 Type 2 diabetes mellitus with diabetic chronic kidney disease: Secondary | ICD-10-CM | POA: Diagnosis not present

## 2021-07-06 DIAGNOSIS — Z951 Presence of aortocoronary bypass graft: Secondary | ICD-10-CM

## 2021-07-06 DIAGNOSIS — E119 Type 2 diabetes mellitus without complications: Secondary | ICD-10-CM

## 2021-07-06 DIAGNOSIS — Z8616 Personal history of COVID-19: Secondary | ICD-10-CM | POA: Diagnosis not present

## 2021-07-06 DIAGNOSIS — D631 Anemia in chronic kidney disease: Secondary | ICD-10-CM | POA: Diagnosis not present

## 2021-07-06 DIAGNOSIS — I214 Non-ST elevation (NSTEMI) myocardial infarction: Secondary | ICD-10-CM | POA: Diagnosis not present

## 2021-07-06 DIAGNOSIS — I2510 Atherosclerotic heart disease of native coronary artery without angina pectoris: Secondary | ICD-10-CM | POA: Diagnosis not present

## 2021-07-06 DIAGNOSIS — I4891 Unspecified atrial fibrillation: Secondary | ICD-10-CM | POA: Diagnosis present

## 2021-07-06 DIAGNOSIS — R079 Chest pain, unspecified: Secondary | ICD-10-CM | POA: Diagnosis present

## 2021-07-06 DIAGNOSIS — Z79899 Other long term (current) drug therapy: Secondary | ICD-10-CM | POA: Insufficient documentation

## 2021-07-06 DIAGNOSIS — Z8543 Personal history of malignant neoplasm of ovary: Secondary | ICD-10-CM | POA: Insufficient documentation

## 2021-07-06 DIAGNOSIS — Z85038 Personal history of other malignant neoplasm of large intestine: Secondary | ICD-10-CM | POA: Diagnosis not present

## 2021-07-06 DIAGNOSIS — I10 Essential (primary) hypertension: Secondary | ICD-10-CM | POA: Diagnosis present

## 2021-07-06 DIAGNOSIS — I482 Chronic atrial fibrillation, unspecified: Secondary | ICD-10-CM | POA: Diagnosis present

## 2021-07-06 DIAGNOSIS — Z20822 Contact with and (suspected) exposure to covid-19: Secondary | ICD-10-CM | POA: Diagnosis not present

## 2021-07-06 LAB — MAGNESIUM: Magnesium: 1.9 mg/dL (ref 1.7–2.4)

## 2021-07-06 LAB — BASIC METABOLIC PANEL WITH GFR
Anion gap: 14 (ref 5–15)
BUN: 38 mg/dL — ABNORMAL HIGH (ref 8–23)
CO2: 21 mmol/L — ABNORMAL LOW (ref 22–32)
Calcium: 9.3 mg/dL (ref 8.9–10.3)
Chloride: 103 mmol/L (ref 98–111)
Creatinine, Ser: 7 mg/dL — ABNORMAL HIGH (ref 0.44–1.00)
GFR, Estimated: 5 mL/min — ABNORMAL LOW
Glucose, Bld: 76 mg/dL (ref 70–99)
Potassium: 4.2 mmol/L (ref 3.5–5.1)
Sodium: 138 mmol/L (ref 135–145)

## 2021-07-06 LAB — HEPATIC FUNCTION PANEL
ALT: 9 U/L (ref 0–44)
AST: 10 U/L — ABNORMAL LOW (ref 15–41)
Albumin: 3.5 g/dL (ref 3.5–5.0)
Alkaline Phosphatase: 142 U/L — ABNORMAL HIGH (ref 38–126)
Bilirubin, Direct: 0.1 mg/dL (ref 0.0–0.2)
Indirect Bilirubin: 0.4 mg/dL (ref 0.3–0.9)
Total Bilirubin: 0.5 mg/dL (ref 0.3–1.2)
Total Protein: 6.8 g/dL (ref 6.5–8.1)

## 2021-07-06 LAB — LIPASE, BLOOD: Lipase: 43 U/L (ref 11–51)

## 2021-07-06 LAB — CBC
HCT: 29.7 % — ABNORMAL LOW (ref 36.0–46.0)
Hemoglobin: 8.1 g/dL — ABNORMAL LOW (ref 12.0–15.0)
MCH: 37.5 pg — ABNORMAL HIGH (ref 26.0–34.0)
MCHC: 27.3 g/dL — ABNORMAL LOW (ref 30.0–36.0)
MCV: 137.5 fL — ABNORMAL HIGH (ref 80.0–100.0)
Platelets: 268 10*3/uL (ref 150–400)
RBC: 2.16 MIL/uL — ABNORMAL LOW (ref 3.87–5.11)
RDW: 17.4 % — ABNORMAL HIGH (ref 11.5–15.5)
WBC: 7.9 10*3/uL (ref 4.0–10.5)
nRBC: 0.3 % — ABNORMAL HIGH (ref 0.0–0.2)

## 2021-07-06 LAB — TROPONIN I (HIGH SENSITIVITY)
Troponin I (High Sensitivity): 43 ng/L — ABNORMAL HIGH (ref <18)
Troponin I (High Sensitivity): 52 ng/L — ABNORMAL HIGH

## 2021-07-06 IMAGING — CR DG CHEST 2V
2 series · 2 of 2 positions shown · non-contrast
Comparison: [DATE]

CLINICAL DATA: Pt states she has been having intermittent chest
pain, but today is worse. States pain is in back as well. Notes from
triage: Pt reports a few weeks of intermittent chest pain, relieved
with nitro.

EXAM:
CHEST - 2 VIEW

[chest pa]
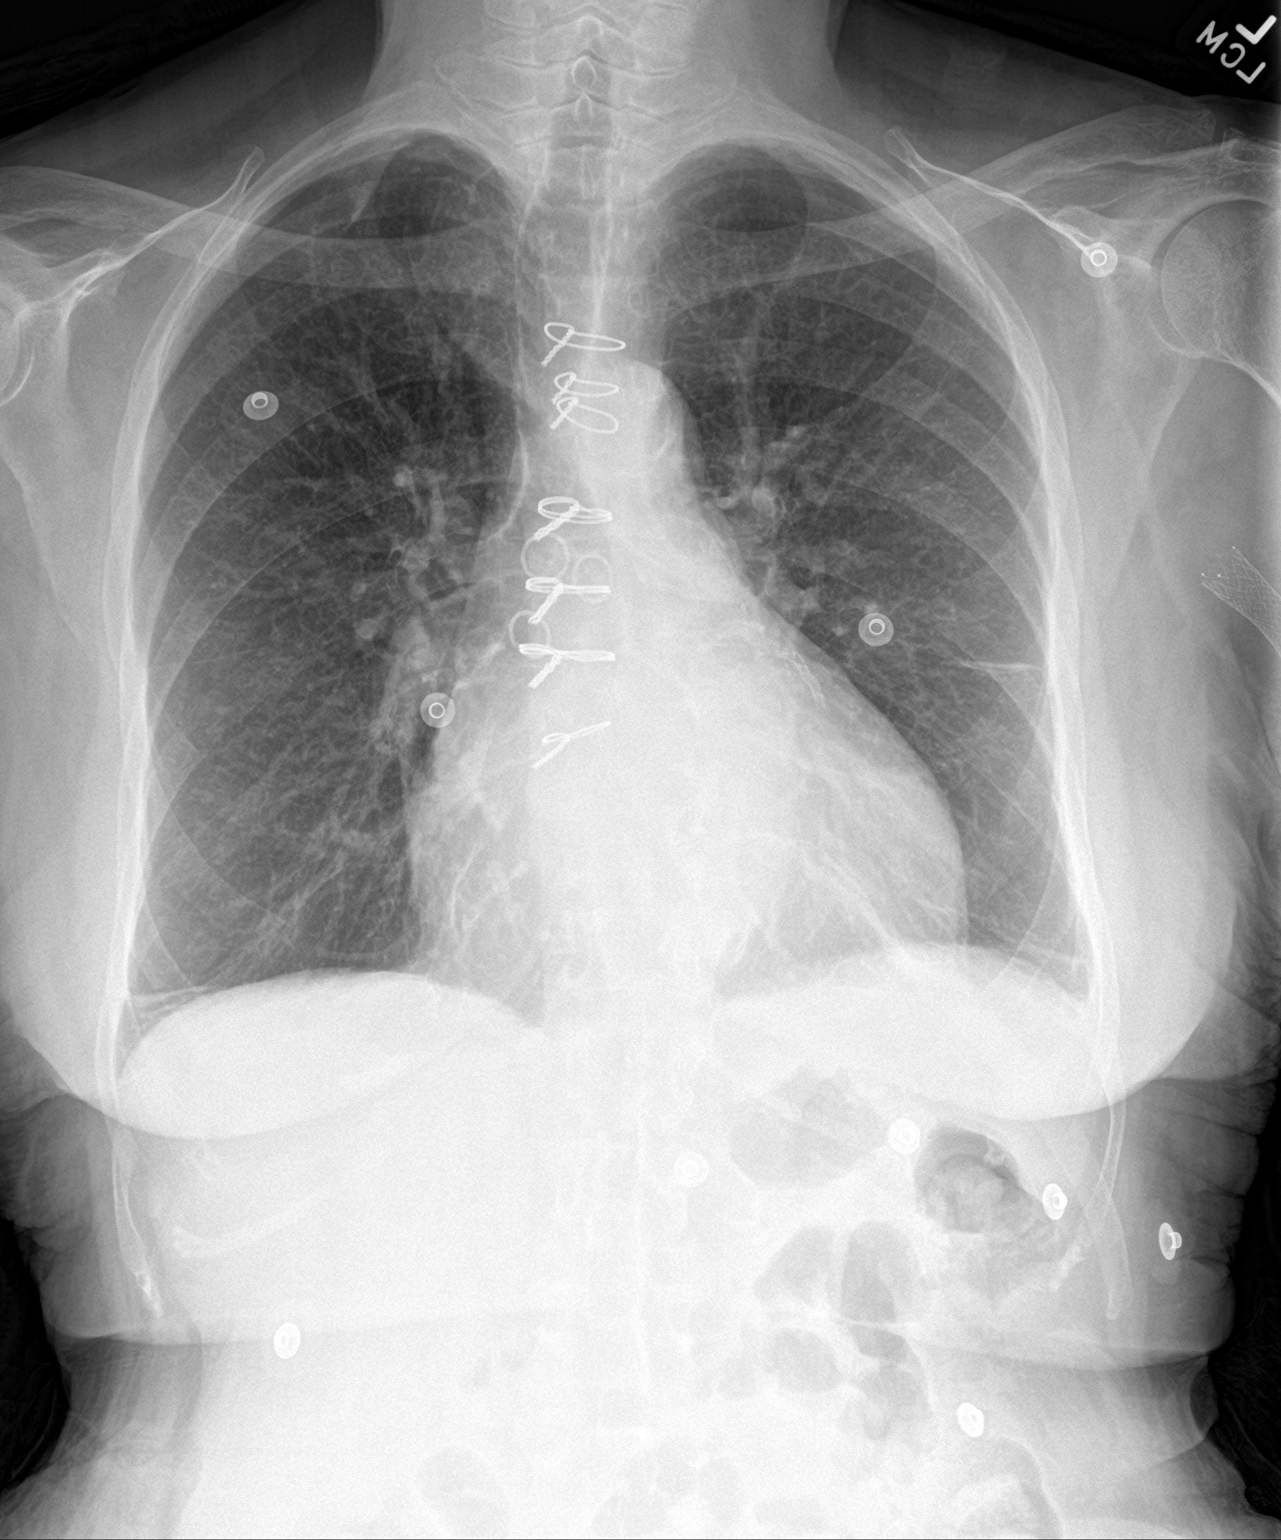

[chest lat]
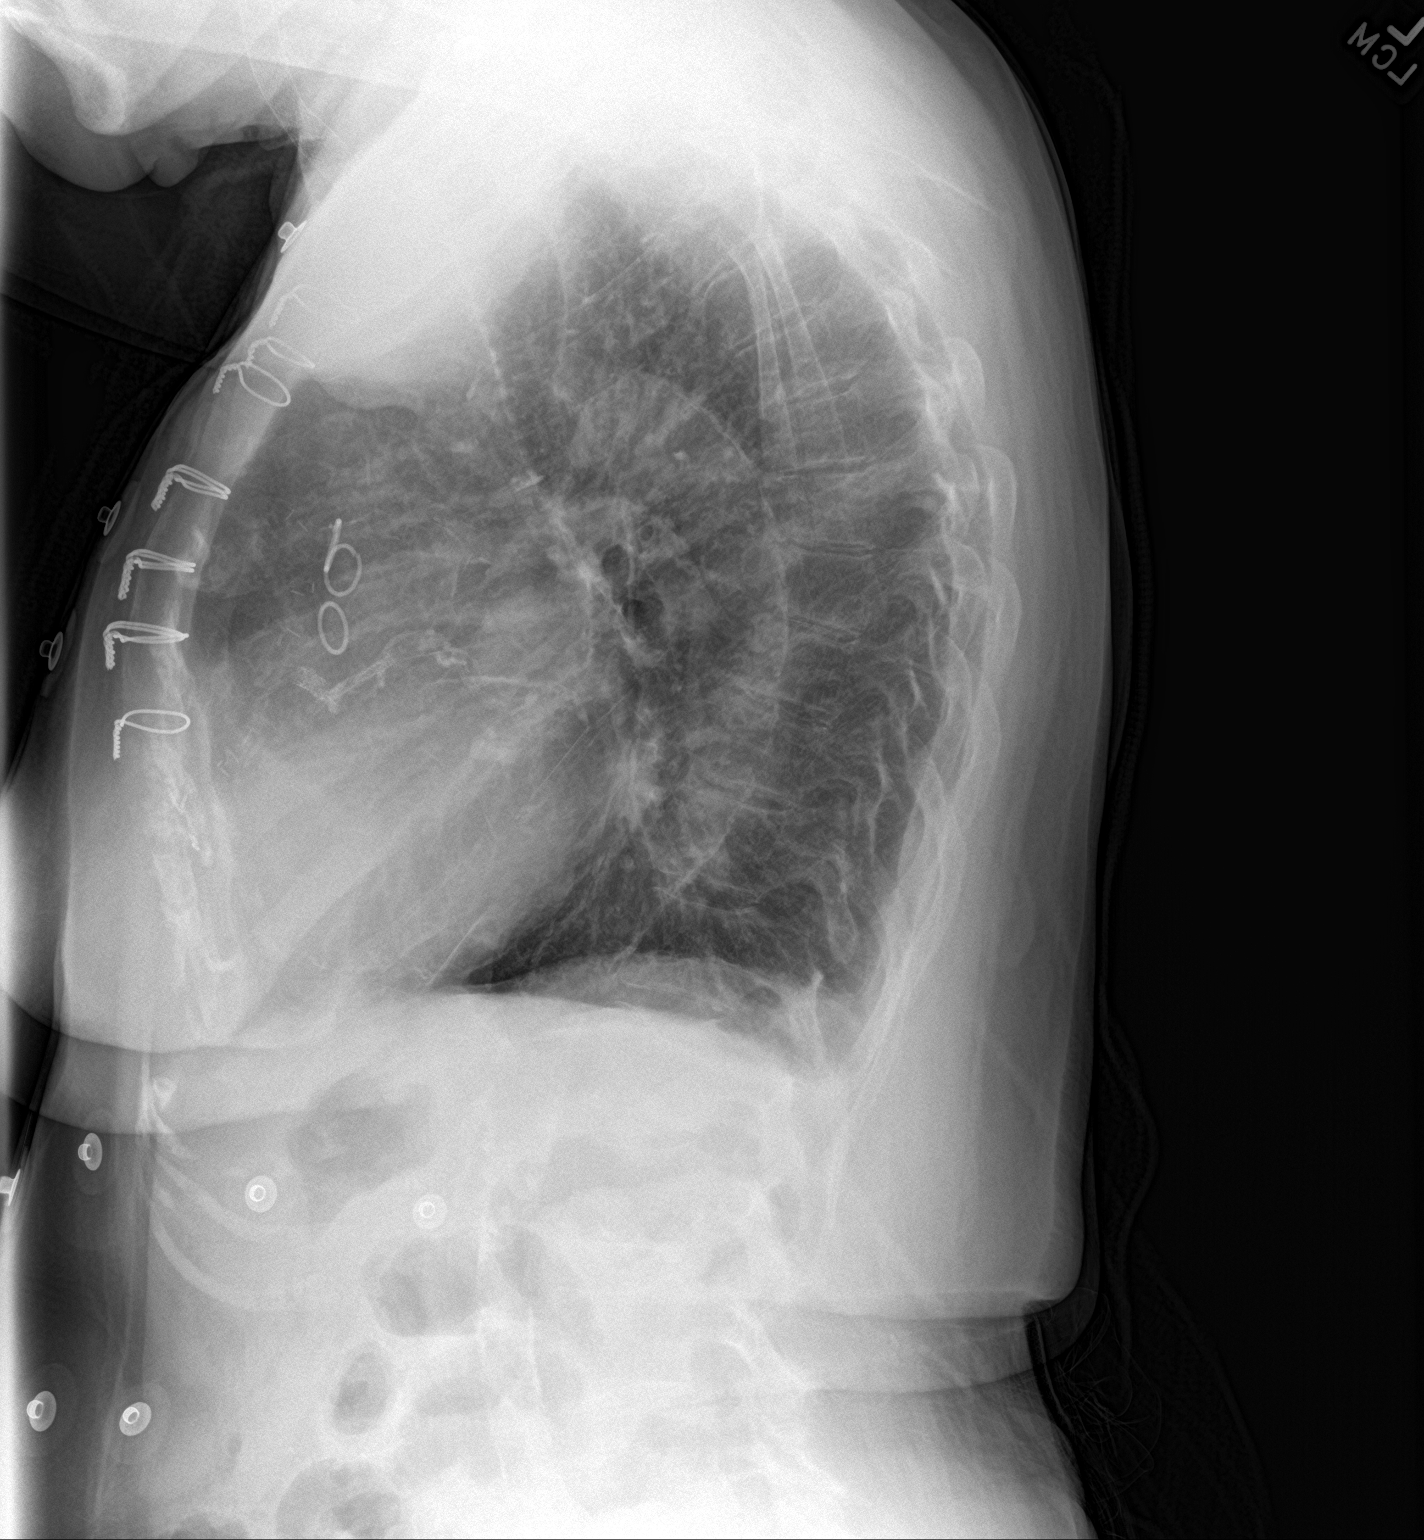

[2 of 2 positions shown; findings below may reference images not displayed]

FINDINGS: Linear scarring or subsegmental atelectasis in the lung bases and
right mid lung.

Heart size upper limits normal. CABG markers and coronary stents.
Aortic Atherosclerosis ([0D]-170.0).

No effusion.  No pneumothorax.

Sternotomy wires.
IMPRESSION: No acute disease post CABG.

## 2021-07-06 MED ORDER — ASPIRIN 81 MG PO CHEW
324.0000 mg | CHEWABLE_TABLET | Freq: Once | ORAL | Status: AC
  Administered 2021-07-07: 324 mg via ORAL
  Filled 2021-07-06: qty 4

## 2021-07-06 MED ORDER — HEPARIN (PORCINE) 25000 UT/250ML-% IV SOLN
750.0000 [IU]/h | INTRAVENOUS | Status: DC
  Administered 2021-07-07: 02:00:00 650 [IU]/h via INTRAVENOUS
  Filled 2021-07-06: qty 250

## 2021-07-06 MED ORDER — HEPARIN BOLUS VIA INFUSION
2000.0000 [IU] | Freq: Once | INTRAVENOUS | Status: AC
  Administered 2021-07-07: 02:00:00 2000 [IU] via INTRAVENOUS
  Filled 2021-07-06: qty 2000

## 2021-07-06 MED ORDER — NITROGLYCERIN 2 % TD OINT
1.0000 [in_us] | TOPICAL_OINTMENT | Freq: Once | TRANSDERMAL | Status: AC
  Administered 2021-07-07: 01:00:00 1 [in_us] via TOPICAL
  Filled 2021-07-06: qty 1

## 2021-07-06 NOTE — ED Provider Notes (Signed)
[facility] EMERGENCY DEPARTMENT Provider Note   CSN: [id number] Arrival date & time: 07/06/21  1647     History Chief Complaint  Patient presents with   Chest Pain    Shawna [name] is a 80 y.o. female.  The history is provided by the patient.  Chest Pain She has history of hypertension, hyperlipidemia, coronary artery disease status post CABG, chronic diastolic heart failure, end-stage renal disease on hemodialysis paroxysmal atrial fibrillation but not on anticoagulation who comes in because of chest pains.  She frequently has chest pain which is relieved with nitroglycerin, estimating nitroglycerin use once or twice a week.  Frequency of pain increased starting 2 days ago and got worse today.  Pain has generally been coming on at rest, but she has noted some increased fatigue.  On 1 occasion, she required 2 nitroglycerin for pain relief which is unusual for her.  She denies dyspnea, nausea, diaphoresis.  She gets dialysis every Monday-Wednesday-Friday and is due for dialysis in the morning.  She states that her last dialysis was about 1 hour less than usual, but she has not noticed any shortness of breath.   Past Medical History:  Diagnosis Date   Acute on chronic respiratory failure with hypoxia ([place]) 10/10/2016   Anxiety    Arthritis    AVM (arteriovenous malformation) of colon    CAD (coronary artery disease)    a. s/p CABG in 2013 b. DES to D1 in 10/2016. c. cath in 07/2018 showing patent grafts with occlusion of D1 at prior stent site and progression of PDA disease --> medical management recommended   Carotid artery disease ([place])    a. 03-00% LICA, 03/2329    Chronic anemia    Chronic bronchitis (HCC)    Chronic diastolic CHF (congestive heart failure) ([place])    a. 02/2012 Echo EF 60-65%, nl wall motion, Gr 1 DD, mod MR   Colon cancer ([place]) 1992   Esophageal stricture    ESRD on hemodialysis ([place])    ESRD due to HTN, started dialysis 2011 and gets HD at  [facility] with Dr Hinda Lenis on MWF schedule.  Access is LUA AVF as of Sept 2014.    GERD (gastroesophageal reflux disease)    High cholesterol 12/2011   History of blood transfusion 07/2011; 12/2011; 01/2012 X 2; 04/2012   History of gout    History of lower GI bleeding    Hypertension    Iron deficiency anemia    Jugular vein occlusion, right (HCC)    Mitral regurgitation    a. Moderate by echo, 02/2012   Myocardial infarction [facility])    NSVT (nonsustained ventricular tachycardia)    Ovarian cancer ([place]) 1992   PAF (paroxysmal atrial fibrillation) ([place])    Pneumonia ~ 2009   PUD (peptic ulcer disease)    TIA (transient ischemic attack)     Patient Active Problem List   Diagnosis Date Noted   Atrial fibrillation ([place]) 05/07/2021   Vitamin B deficiency 04/23/2021   Neuropathy 04/23/2021   Anemia due to chronic blood loss 04/13/2021   Left knee pain 03/15/2021   Moderate protein-calorie malnutrition ([place]) 02/27/2021   Hypokalemia 02/27/2021   COVID-19 virus infection 02/03/2021   Leukocytosis 02/03/2021   Elevated MCV 02/03/2021   Hypoalbuminemia due to protein-calorie malnutrition ([place]) 02/03/2021   Hypothyroidism (acquired) 02/03/2021   Myositis 12/03/2020   Ataxia 12/02/2020   Diabetes mellitus type 2 in nonobese ([place]) 11/10/2020   Acute pulmonary edema ([place]) 11/10/2020  Generalized weakness 11/09/2020   Dialysis AV fistula malfunction, initial encounter ([place])    Jugular vein occlusion, right (HCC)    Failure of surgically constructed arteriovenous fistula ([place]) 10/03/2020   Myoclonus 08/31/2020   Clotted renal dialysis AV graft, initial encounter ([place])    Hypertensive heart and chronic kidney disease with heart failure and stage 1 through stage 4 chronic kidney disease, or chronic kidney disease ([place])    Hemodialysis-associated hypotension    Acute hypoxemic respiratory failure ([place]) 06/14/2020   Hypertension 06/12/2020   Irritable bowel syndrome 02/25/2020   Adenomatous  duodenal polyp 09/10/2019   History of GI bleed 09/10/2019   Angina pectoris ([place]) 06/05/2019   Chest pain 06/03/2019   Small intestinal bacterial overgrowth 05/14/2019   Iron deficiency anemia 04/02/2019   Acute respiratory failure with hypoxia ([place]) 12/25/2018   Elevated troponin 12/14/2018   Chest pain at rest 07/13/2018   Hand steal syndrome ([place]) 08/01/2017   Anemia 07/14/2017   Coronary artery disease 06/05/2017   Mesenteric ischemia (HCC)    Diverticulitis    Chronic diarrhea    Complication of vascular access for dialysis 03/19/2017   Preoperative clearance 01/25/2017   H/O non-ST elevation myocardial infarction (NSTEMI) 10/24/2016   Fluid overload 66/59/9357   Complication from renal dialysis device 10/10/2016   Non-ST elevation MI (NSTEMI) ([place])    Encounter for fitting and adjustment of vascular catheter    Heme positive stool    Demand ischemia ([place]) 07/27/2016   Hypertensive emergency 07/08/2016   Acute on chronic respiratory failure with hypoxia [facility])    Cardiac arrest [facility])    Palliative care encounter    Goals of care, counseling/discussion    Hypertensive crisis without congestive heart failure 05/09/2016   Flash pulmonary edema ([place]) 04/06/2016   Acute respiratory failure ([place]) 04/06/2016   Hypertensive crisis 01/27/2016   History of colon cancer 01/27/2016   History of ovarian cancer 01/27/2016   Hypertensive urgency 01/27/2016   Paroxysmal atrial fibrillation ([place]) 10/14/2015   Coronary angioplasty status 10/14/2015   Malignant neoplasm of right ovary ([place]) 10/14/2015   Narrow complex tachycardia ([place]) 09/08/2015   SVT (supraventricular tachycardia) ([place]) 09/08/2015   Influenza A 08/30/2015   Acute on chronic diastolic CHF (congestive heart failure) ([place]) 05/04/2015   Unstable angina ([place]) 05/03/2015   DOE (dyspnea on exertion)    Essential hypertension    Hyponatremia 10/01/2014   Pain in joint, lower leg 08/14/2014   Dacryocystitis 05/29/2013    Chronic diastolic CHF (congestive heart failure) ([place]) 03/22/2013   Acute post-hemorrhagic anemia 03/21/2013   Occlusion and stenosis of carotid artery without mention of cerebral infarction 01/24/2013   Hx of CABG 07/05/2012   Carotid artery disease ([place]) 07/05/2012   Mitral regurgitation 06/12/2012   Pneumonia 06/09/2012   Non-STEMI (non-ST elevated myocardial infarction) ([place]) 06/08/2012   Ischemic chest pain ([place]) 03/01/2012   AVM (arteriovenous malformation) of small bowel, acquired 01/20/2012   GERD (gastroesophageal reflux disease) 01/09/2012   HLD (hyperlipidemia) 01/05/2012   Atherosclerotic heart disease of native coronary artery without angina pectoris 12/16/2011   Essential hypertension, benign 12/16/2011   ESRD on hemodialysis ([place]) 12/16/2011   Anxiety disorder 05/04/2011   Anemia in chronic kidney disease 04/29/2011   ESRD (end stage renal disease) on dialysis ([place]) 04/29/2011   Gout 04/29/2011   Hypertensive chronic kidney disease with stage 5 chronic kidney disease or end stage renal disease ([place]) 04/29/2011    Past Surgical History:  Procedure Laterality Date   A/V SHUNTOGRAM  Left 03/19/2019   Procedure: A/V SHUNTOGRAM;  Surgeon: Katha Cabal, MD;  Location: [facility];  Service: Cardiovascular;  Laterality: Left;   ABDOMINAL HYSTERECTOMY  1992   APPENDECTOMY  06/1990   AV FISTULA PLACEMENT  07/2009   left upper arm   AV FISTULA PLACEMENT Right 09/06/2016   Procedure: RIGHT FOREARM ARTERIOVENOUS (AV) GRAFT;  Surgeon: Elam Dutch, MD;  Location: [facility];  Service: Vascular;  Laterality: Right;   AV FISTULA PLACEMENT N/A 02/24/2017   Procedure: INSERTION OF ARTERIOVENOUS (AV) GORE-TEX GRAFT ARM (BRACHIAL AXILLARY);  Surgeon: Katha Cabal, MD;  Location: [facility] ORS;  Service: Vascular;  Laterality: N/A;   [place] Right 09/06/2016   Procedure: REMOVAL OF Right Arm ARTERIOVENOUS GORETEX GRAFT and Vein Patch angioplasty of brachial artery;   Surgeon: Angelia Mould, MD;  Location: [facility];  Service: Vascular;  Laterality: Right;   BIOPSY  09/26/2019   Procedure: BIOPSY;  Surgeon: Rogene Houston, MD;  [facility];  Service: Endoscopy;;   COLON RESECTION  1992   COLON SURGERY     COLONOSCOPY N/A 03/09/2019   Procedure: COLONOSCOPY;  Surgeon: Rogene Houston, MD;  [facility];  Service: Endoscopy;  Laterality: N/A;   COLONOSCOPY WITH PROPOFOL N/A 06/08/2021   Procedure: COLONOSCOPY WITH PROPOFOL;  Surgeon: Harvel Quale, MD;  [facility];  Service: Gastroenterology;  Laterality: N/A;  9:05 /Patient is on dialysis Mon Wed Fri   CORONARY ANGIOPLASTY WITH STENT PLACEMENT  12/15/11   "2"   CORONARY ANGIOPLASTY WITH STENT PLACEMENT  y/2013   "1; makes total of 3" (05/02/2012)   CORONARY ARTERY BYPASS GRAFT  06/13/2012   Procedure: CORONARY ARTERY BYPASS GRAFTING (CABG);  Surgeon: Grace Isaac, MD;  Location: [facility];  Service: Open Heart Surgery;  Laterality: N/A;  cabg x four;  using left internal mammary artery, and left leg greater saphenous vein harvested endoscopically   CORONARY STENT INTERVENTION N/A 10/13/2016   Procedure: Coronary Stent Intervention;  Surgeon: Troy Sine, MD;  Location: [facility];  Service: Cardiovascular;  Laterality: N/A;   DIALYSIS/PERMA CATHETER REMOVAL N/A 04/18/2017   Procedure: DIALYSIS/PERMA CATHETER REMOVAL;  Surgeon: Katha Cabal, MD;  Location: [facility];  Service: Cardiovascular;  Laterality: N/A;   DILATION AND CURETTAGE OF UTERUS     ENTEROSCOPY N/A 06/08/2021   Procedure: PUSH ENTEROSCOPY;  Surgeon: Harvel Quale, MD;  [facility];  Service: Gastroenterology;  Laterality: N/A;   ESOPHAGOGASTRODUODENOSCOPY  01/20/2012   Procedure: ESOPHAGOGASTRODUODENOSCOPY (EGD);  Surgeon: Ladene Artist, MD,FACG;  Location: [facility] ENDOSCOPY;  Service: Endoscopy;  Laterality: N/A;   ESOPHAGOGASTRODUODENOSCOPY N/A  03/26/2013   Procedure: ESOPHAGOGASTRODUODENOSCOPY (EGD);  Surgeon: Irene Shipper, MD;  Location: [facility] ENDOSCOPY;  Service: Endoscopy;  Laterality: N/A;   ESOPHAGOGASTRODUODENOSCOPY N/A 04/30/2015   Procedure: ESOPHAGOGASTRODUODENOSCOPY (EGD);  Surgeon: Rogene Houston, MD;  [facility];  Service: Endoscopy;  Laterality: N/A;  1pm - moved to 10/20 @ 1:10   ESOPHAGOGASTRODUODENOSCOPY N/A 07/29/2016   Procedure: ESOPHAGOGASTRODUODENOSCOPY (EGD);  Surgeon: Manus Gunning, MD;  Location: [facility];  Service: Gastroenterology;  Laterality: N/A;  enteroscopy   ESOPHAGOGASTRODUODENOSCOPY N/A 09/26/2019   Procedure: ESOPHAGOGASTRODUODENOSCOPY (EGD);  Surgeon: Rogene Houston, MD;  [facility];  Service: Endoscopy;  Laterality: N/A;  1250   ESOPHAGOGASTRODUODENOSCOPY (EGD) WITH PROPOFOL N/A 02/05/2021   Procedure: ESOPHAGOGASTRODUODENOSCOPY (EGD) WITH PROPOFOL;  Surgeon: Eloise Harman, DO;  [facility];  Service: Endoscopy;  Laterality: N/A;   GIVENS CAPSULE STUDY N/A 03/07/2019   Procedure: GIVENS CAPSULE STUDY;  Surgeon: Rogene Houston, MD;  [facility];  Service: Endoscopy;  Laterality: N/A;  7:30   GIVENS CAPSULE STUDY N/A 04/22/2021   Procedure: GIVENS CAPSULE STUDY;  Surgeon: Rogene Houston, MD;  [facility];  Service: Endoscopy;  Laterality: N/A;  7:30   INSERTION OF DIALYSIS CATHETER N/A 10/05/2020   Procedure: ABORTED TUNNELED DIALYSIS CATHETER PLACEMENT RIGHT INTERNAL JUGULAR VEIN ;  Surgeon: Virl Cagey, MD;  Location: [facility];  Service: General;  Laterality: N/A;   INTRAOPERATIVE TRANSESOPHAGEAL ECHOCARDIOGRAM  06/13/2012   Procedure: INTRAOPERATIVE TRANSESOPHAGEAL ECHOCARDIOGRAM;  Surgeon: Grace Isaac, MD;  Location: [facility];  Service: Open Heart Surgery;  Laterality: N/A;   IR DIALY SHUNT INTRO NEEDLE/INTRACATH INITIAL W/IMG LEFT Left 10/06/2020   IR FLUORO GUIDE CV LINE RIGHT  06/17/2020   IR GENERIC HISTORICAL   07/26/2016   IR FLUORO GUIDE CV LINE RIGHT 07/26/2016 Greggory Keen, MD [facility]   IR GENERIC HISTORICAL  07/26/2016   IR US GUIDE VASC ACCESS RIGHT 07/26/2016 Greggory Keen, MD [facility]   IR GENERIC HISTORICAL  08/02/2016   IR US GUIDE VASC ACCESS RIGHT 08/02/2016 Greggory Keen, MD [facility]   IR GENERIC HISTORICAL  08/02/2016   IR FLUORO GUIDE CV LINE RIGHT 08/02/2016 Greggory Keen, MD [facility]   IR RADIOLOGY PERIPHERAL GUIDED IV START  03/28/2017   IR REMOVAL TUN CV CATH W/O FL  08/11/2020   IR THROMBECTOMY AV FISTULA W/THROMBOLYSIS INC/SHUNT/IMG LEFT Left 06/17/2020   IR US GUIDE VASC ACCESS LEFT  06/17/2020   IR US GUIDE VASC ACCESS RIGHT  03/28/2017   IR US GUIDE VASC ACCESS RIGHT  06/17/2020   LEFT HEART CATH AND CORONARY ANGIOGRAPHY N/A 09/20/2016   Procedure: Left Heart Cath and Coronary Angiography;  Surgeon: [name], MD;  Location: [facility];  Service: Cardiovascular;  Laterality: N/A;   LEFT HEART CATH AND CORS/GRAFTS ANGIOGRAPHY N/A 10/13/2016   Procedure: Left Heart Cath and Cors/Grafts Angiography;  Surgeon: Troy Sine, MD;  Location: [facility];  Service: Cardiovascular;  Laterality: N/A;   LEFT HEART CATH AND CORS/GRAFTS ANGIOGRAPHY N/A 07/13/2018   Procedure: LEFT HEART CATH AND CORS/GRAFTS ANGIOGRAPHY;  Surgeon: Martinique, Peter M, MD;  Location: [facility];  Service: Cardiovascular;  Laterality: N/A;   LEFT HEART CATHETERIZATION WITH CORONARY ANGIOGRAM N/A 12/15/2011   Procedure: LEFT HEART CATHETERIZATION WITH CORONARY ANGIOGRAM;  Surgeon: Burnell Blanks, MD;  Location: [facility] CATH LAB;  Service: Cardiovascular;  Laterality: N/A;   LEFT HEART CATHETERIZATION WITH CORONARY ANGIOGRAM N/A 01/10/2012   Procedure: LEFT HEART CATHETERIZATION WITH CORONARY ANGIOGRAM;  Surgeon: Peter M Martinique, MD;  Location: [facility] CATH LAB;  Service: Cardiovascular;  Laterality: N/A;   LEFT HEART CATHETERIZATION WITH CORONARY ANGIOGRAM N/A 06/08/2012   Procedure: LEFT  HEART CATHETERIZATION WITH CORONARY ANGIOGRAM;  Surgeon: Burnell Blanks, MD;  Location: [facility] CATH LAB;  Service: Cardiovascular;  Laterality: N/A;   LEFT HEART CATHETERIZATION WITH CORONARY/GRAFT ANGIOGRAM N/A 12/10/2013   Procedure: LEFT HEART CATHETERIZATION WITH Beatrix Fetters;  Surgeon: [name], MD;  Location: [facility] CATH LAB;  Service: Cardiovascular;  Laterality: N/A;   OVARY SURGERY     ovarian cancer   POLYPECTOMY  03/09/2019   Procedure: POLYPECTOMY;  Surgeon: Rogene Houston, MD;  [facility];  Service: Endoscopy;;  cecal    POLYPECTOMY N/A 09/26/2019   Procedure: DUODENAL POLYPECTOMY;  Surgeon: Rogene Houston, MD;  [facility];  Service: Endoscopy;  Laterality: N/A;   REVISION OF ARTERIOVENOUS GORETEX GRAFT N/A 02/24/2017   Procedure: REVISION OF ARTERIOVENOUS GORETEX GRAFT (RESECTION);  Surgeon: Katha Cabal, MD;  Location: [facility] ORS;  Service: Vascular;  Laterality: N/A;   REVISON OF ARTERIOVENOUS FISTULA Left 06/19/2020   Procedure: REVISION OF LEFT UPPER ARM AV GRAFT WITH INTERPOSITION JUMP GRAFT USING 6MM GORE LIMB;  Surgeon: Marty Heck, MD;  Location: [facility];  Service: Vascular;  Laterality: Left;   SHUNTOGRAM N/A 10/15/2013   Procedure: Fistulogram;  Surgeon: Serafina Mitchell, MD;  Location: [facility] CATH LAB;  Service: Cardiovascular;  Laterality: N/A;   THROMBECTOMY / ARTERIOVENOUS GRAFT REVISION  2011   left upper arm   TUBAL LIGATION  1980's   UPPER EXTREMITY ANGIOGRAPHY Bilateral 12/06/2016   Procedure: Upper Extremity Angiography;  Surgeon: Katha Cabal, MD;  Location: [facility] [facility];  Service: Cardiovascular;  Laterality: Bilateral;   UPPER EXTREMITY INTERVENTION Left 06/06/2017   Procedure: UPPER EXTREMITY INTERVENTION;  Surgeon: Katha Cabal, MD;  Location: [place] CV LAB;  Service: Cardiovascular;  Laterality: Left;     OB History     Gravida  2   Para  2   Term      Preterm  2    AB      Living  2      SAB      IAB      Ectopic      Multiple      Live Births              Family History  Problem Relation Age of Onset   Heart disease Mother        Heart Disease before age 68   Hyperlipidemia Mother    Hypertension Mother    Diabetes Mother    Heart attack Mother    Heart disease Father        Heart Disease before age 51   Hyperlipidemia Father    Hypertension Father    Diabetes Father    Diabetes Sister    Hypertension Sister    Diabetes Brother    Hyperlipidemia Brother    Heart attack Brother    Hypertension Sister    Heart attack Brother    Colon cancer Child 20   Other Other        noncontributory for early CAD   Esophageal cancer Neg Hx    Liver disease Neg Hx    Kidney disease Neg Hx    Colon polyps Neg Hx     Social History   Tobacco Use   Smoking status: Never   Smokeless tobacco: Never  Vaping Use   Vaping Use: Never used  Substance Use Topics   Alcohol use: No    Alcohol/week: 0.0 standard drinks   Drug use: No    Home Medications Prior to Admission medications   Medication Sig Start Date End Date Taking? Authorizing Provider  ALPRAZolam Duanne Moron) 0.5 MG tablet Take 0.5 mg by mouth at bedtime. 03/20/21   [provider]  amiodarone (PACERONE) 200 MG tablet Take 1 tablet (200 mg total) by mouth daily. 05/13/21   Strader, Fransisco Hertz, PA-C  aspirin EC 81 MG tablet Take 81 mg by mouth daily.  09/27/19   Rehman, Mechele Dawley, MD  benzonatate (TESSALON) 100 MG capsule Take 100  mg by mouth 3 (three) times daily as needed for cough. 04/30/21   [provider]  cyclobenzaprine (FLEXERIL) 5 MG tablet Take 5 mg by mouth 3 (three) times daily as needed for muscle spasms.    [provider]  diclofenac Sodium (VOLTAREN) 1 % GEL Apply 2 g topically 4 (four) times daily as needed. Patient not taking: Reported on 07/06/2021 03/12/21   Margette Fast, MD  ferrous sulfate 325 (65 FE) MG tablet Take 1 tablet (325  mg total) by mouth daily. Patient not taking: Reported on 07/06/2021 06/24/21   Marcello Fennel, PA-C  fluticasone [facility]) 50 MCG/ACT nasal spray Place 1 spray into both nostrils at bedtime as needed for allergies.     [provider]  hydrOXYzine (VISTARIL) 100 MG capsule Take 100 mg by mouth daily as needed for anxiety. 11/23/20   [provider]  isosorbide mononitrate (IMDUR) 60 MG 24 hr tablet Take 1.5 tablets (90 mg total) by mouth 2 (two) times daily. 06/22/21   Arnoldo Lenis, MD  levothyroxine (SYNTHROID) 25 MCG tablet Take 1 tablet (25 mcg total) by mouth daily. 02/02/21   Brita Romp, NP  lidocaine-prilocaine (EMLA) cream Apply 1 application topically every Monday, Wednesday, and Friday. Prior to dialysis    [provider]  loperamide (IMODIUM A-D) 2 MG tablet Take 2 tablets (4 mg total) by mouth daily as needed for diarrhea or loose stools. 12/17/20   Thurnell Lose, MD  metoprolol tartrate (LOPRESSOR) 50 MG tablet Take 1 tablet (50 mg total) by mouth 2 (two) times daily. 05/13/21   Ahmed Prima, Fransisco Hertz, PA-C  midodrine (PROAMATINE) 5 MG tablet Take Monday, Wednesday, Friday morning BEFORE dialysis 12/22/20   Arnoldo Lenis, MD  multivitamin (RENA-VIT) TABS tablet Take 1 tablet by mouth daily.    [provider]  Na Sulfate-K Sulfate-Mg Sulf (SUPREP BOWEL PREP KIT) 17.5-3.13-1.6 GM/177ML SOLN Take 1 kit by mouth as directed. 06/11/21   Harvel Quale, MD  nitroGLYCERIN (NITROSTAT) 0.4 MG SL tablet Place 1 tablet (0.4 mg total) under the tongue every 5 (five) minutes x 3 doses as needed for chest pain (if no relief after 2nd dose, proceed to the ED for an evaluation or call 911). 02/19/21   Verta Ellen., NP  omeprazole (PRILOSEC) 20 MG capsule Take 1 capsule (20 mg total) by mouth daily. Patient taking differently: Take 10 mg by mouth daily. 12/01/20   Rehman, Mechele Dawley, MD  rosuvastatin (CRESTOR) 10 MG tablet Take 1 tablet  (10 mg total) by mouth daily. 05/13/21   Strader, Fransisco Hertz, PA-C  sevelamer carbonate (RENVELA) 800 MG tablet Take 2 tablets (1,600 mg total) by mouth 3 (three) times daily with meals. Patient taking differently: Take 2,400 mg by mouth 3 (three) times daily with meals. 06/20/20   Meccariello, Bernita Raisin, DO    Allergies    Amlodipine, Aspirin, Nitrofurantoin, Penicillins, Bactrim [sulfamethoxazole-trimethoprim], Contrast media [iodinated contrast media], Iron, Tylenol [acetaminophen], Gabapentin, Iron sucrose, No known allergies, Ranexa [ranolazine], Sucroferric oxyhydroxide, Clopidogrel, Dexilant [dexlansoprazole], Hydralazine, Levaquin [levofloxacin in d5w], Levofloxacin, Morphine, Morphine and related, Pantoprazole, Plavix [clopidogrel bisulfate], Protonix [pantoprazole sodium], and Venofer [ferric oxide]  Review of Systems   Review of Systems  Cardiovascular:  Positive for chest pain.  All other systems reviewed and are negative.  Physical Exam Updated Vital Signs BP (!) 128/59    Pulse 82    Temp 99 F (37.2 C) (Oral)    Resp 17  Ht _0  (1.549 m)    Wt 55.8 kg    SpO2 98%    BMI 23.24 kg/m   Physical Exam Vitals and nursing note reviewed.  80 year old female, resting comfortably and in no acute distress. Vital signs are normal. Oxygen saturation is 98%, which is normal. Head is normocephalic and atraumatic. PERRLA, EOMI. Oropharynx is clear. Neck is nontender and supple without adenopathy or JVD. Back is nontender and there is no CVA tenderness. Lungs are clear without rales, wheezes, or rhonchi. Chest is nontender. Heart has regular rate and rhythm without murmur. Abdomen is soft, flat, nontender. Extremities have trace edema, full range of motion is present.  AV fistula is present in the left upper arm with thrill present. Skin is warm and dry without rash. Neurologic: Mental status is normal, cranial nerves are intact, moves all extremities equally.  ED Results /  Procedures / Treatments   Labs (all labs ordered are listed, but only abnormal results are displayed) Labs Reviewed  BASIC METABOLIC PANEL - Abnormal; Notable for the following components:      Result Value   CO2 21 (*)    BUN 38 (*)    Creatinine, Ser 7.00 (*)    GFR, Estimated 5 (*)    All other components within normal limits  CBC - Abnormal; Notable for the following components:   RBC 2.16 (*)    Hemoglobin 8.1 (*)    HCT 29.7 (*)    MCV 137.5 (*)    MCH 37.5 (*)    MCHC 27.3 (*)    RDW 17.4 (*)    nRBC 0.3 (*)    All other components within normal limits  HEPATIC FUNCTION PANEL - Abnormal; Notable for the following components:   AST 10 (*)    Alkaline Phosphatase 142 (*)    All other components within normal limits  TROPONIN I (HIGH SENSITIVITY) - Abnormal; Notable for the following components:   Troponin I (High Sensitivity) 43 (*)    All other components within normal limits  TROPONIN I (HIGH SENSITIVITY) - Abnormal; Notable for the following components:   Troponin I (High Sensitivity) 52 (*)    All other components within normal limits  RESP PANEL BY RT-PCR (FLU A&B, COVID) [id number]  MAGNESIUM  LIPASE, BLOOD    EKG EKG Interpretation  Date/Time:  Tuesday July 06 2021 17:25:19 EST Ventricular Rate:  77 PR Interval:  162 QRS Duration: 118 QT Interval:  432 QTC Calculation: 488 R Axis:   0 Text Interpretation: Sinus rhythm with occasional Premature ventricular complexes Left ventricular hypertrophy with QRS widening and repolarization abnormality ( Cornell product ) Abnormal ECG When compared with ECG of 06/17/2021, T wave inversion Anterolateral leads has improved Confirmed by Delora Fuel ([id number]) on 07/06/2021 10:59:40 PM  Radiology DG Chest 2 View  Result Date: 07/06/2021 CLINICAL DATA:  Pt states she has been having intermittent chest pain, but today is worse. States pain is in back as well. Notes from triage: Pt reports a few weeks of intermittent chest  pain, relieved with nitro. EXAM: CHEST - 2 VIEW COMPARISON:  06/17/2021 FINDINGS: Linear scarring or subsegmental atelectasis in the lung bases and right mid lung. Heart size upper limits normal. CABG markers and coronary stents. Aortic Atherosclerosis (ICD10-170.0). No effusion.  No pneumothorax. Sternotomy wires. IMPRESSION: No acute disease post CABG. Electronically Signed   By: Lucrezia Europe M.D.   On: 07/06/2021 18:30    Procedures Procedures  CRITICAL CARE Performed by: Shanon Brow  Salam Chesterfield Total critical care time: 40 minutes Critical care time was exclusive of separately billable procedures and treating other patients. Critical care was necessary to treat or prevent imminent or life-threatening deterioration. Critical care was time spent personally by me on the following activities: development of treatment plan with patient and/or surrogate as well as nursing, discussions with consultants, evaluation of patient's response to treatment, examination of patient, obtaining history from patient or surrogate, ordering and performing treatments and interventions, ordering and review of laboratory studies, ordering and review of radiographic studies, pulse oximetry and re-evaluation of patient's condition.  Medications Ordered in ED Medications  nitroGLYCERIN (NITROGLYN) 2 % ointment 1 inch (has no administration in time range)  aspirin chewable tablet 324 mg (has no administration in time range)    ED Course  I have reviewed the triage vital signs and the nursing notes.  Pertinent labs & imaging results that were available during my care of the patient were reviewed by me and considered in my medical decision making (see chart for details).   MDM Rules/Calculators/A&P                         Chest pain and pattern consistent with unstable angina.  ECG shows some normalization of previous T wave inversions which is could actually be an ischemic change.  Initial troponin is slightly elevated and repeat  troponin has increased.  This is consistent with a non-STEMI.  Other labs include stable macrocytic anemia, and elevated BUN and creatinine consistent with end-stage renal disease.  She is given a dose of aspirin and started on heparin intravenously and topical nitroglycerin.  Old records are reviewed showing catheterization in January 2020 at which time there was a 75% occlusion of the posterior descending artery which was felt to be a culprit lesion and possibly amenable to PCI if failed medical management.  Case is discussed with Dr. Hal Hope of [facility], who agrees to admit the patient.  Case also discussed with Dr. Humphrey Rolls of cardiology service who agrees to see the patient in consultation.  Final Clinical Impression(s) / ED Diagnoses Final diagnoses:  Non-STEMI (non-ST elevated myocardial infarction) ([place])  End-stage renal disease on hemodialysis ([place])  Anemia associated with chronic renal failure    Rx / DC Orders ED Discharge Orders     None        Delora Fuel, MD 30/85/69 0002

## 2021-07-06 NOTE — ED Triage Notes (Signed)
Pt reports a few weeks of intermittent chest pain, relieved with nitro. However today pain is worse, accompanied by back pain, and still present after nitro. MWF dialysis patient, had full tx yesterday.

## 2021-07-06 NOTE — ED Provider Notes (Signed)
Emergency Medicine Provider Triage Evaluation Note  Shawna [name] , a 80 y.o. female  was evaluated in triage.  Pt complains of left-sided chest pain intermittently for the last couple of weeks but worse today, radiating to her back.  Improved with nitroglycerin.  Dialysis Monday/Wednesday/Friday, most recent session yesterday.  Endorses some shortness of breath..  Review of Systems  Positive: Chest pain, shortness of breath Negative: Fevers, chills, nausea, vomiting  Physical Exam  BP 130/61 (BP Location: Right Arm)    Pulse 78    Temp 99 F (37.2 C) (Oral)    Resp 16    SpO2 98%  Gen:   Awake, no distress   Resp:  Normal effort  MSK:   Moves extremities without difficulty  Other:  Rales in the right lung base, RRR no M/R/G.    Medical Decision Making  Medically screening exam initiated at 5:47 PM.  Appropriate orders placed.  [name] was informed that the remainder of the evaluation will be completed by another provider, this initial triage assessment does not replace that evaluation, and the importance of remaining in the ED until their evaluation is complete.  This chart was dictated using voice recognition software, Dragon. Despite the best efforts of this provider to proofread and correct errors, errors may still occur which can change documentation meaning.    Emeline Darling, PA-C 07/06/21 1800    Lorelle Gibbs, DO 07/06/21 2351

## 2021-07-06 NOTE — Progress Notes (Signed)
ANTICOAGULATION CONSULT NOTE - Initial Consult  Pharmacy Consult for heparin Indication: chest pain/ACS  Allergies  Allergen Reactions   Amlodipine Swelling   Aspirin Other (See Comments)    High Doses Mess up her stomach; "makes my bowels have blood in them". Takes 81 mg EC Aspirin    Nitrofurantoin Hives   Penicillins Other (See Comments)    SYNCOPE? , "makes me real weak when I take it; like I'll pass out"  Has patient had a PCN reaction causing immediate rash, facial/tongue/throat swelling, SOB or lightheadedness with hypotension: Yes Has patient had a PCN reaction causing severe rash involving mucus membranes or skin necrosis: no Has patient had a PCN reaction that required hospitalization no Has patient had a PCN reaction occurring within the last 10 years: no If all of the above    Bactrim [Sulfamethoxazole-Trimethoprim] Rash   Contrast Media [Iodinated Contrast Media] Itching   Iron Itching and Other (See Comments)    "they gave me iron in dialysis; had to give me Benadryl cause I had to have the iron" (05/02/2012)   Tylenol [Acetaminophen] Itching and Other (See Comments)    Makes her feet on fire per pt   Gabapentin Other (See Comments)    Unknown reaction   Iron Sucrose    No Known Allergies    Ranexa [Ranolazine] Other (See Comments)    Myoclonus-hospitalized    Sucroferric Oxyhydroxide    Clopidogrel Rash   Dexilant [Dexlansoprazole] Other (See Comments)    Upset stomach   Hydralazine Itching    Has tolerated while inpatient Has tolerated while inpatient   Levaquin [Levofloxacin In D5w] Rash   Levofloxacin Rash   Morphine Itching   Morphine And Related Itching and Other (See Comments)    Itching in feet   Pantoprazole Rash   Plavix [Clopidogrel Bisulfate] Rash   Protonix [Pantoprazole Sodium] Rash   Venofer [Ferric Oxide] Itching and Other (See Comments)    Patient reports using Benadryl prior to doses as [place]    Patient  Measurements: Height: 5\' 1"  (154.9 cm) Weight: 55.8 kg (123 lb) IBW/kg (Calculated) : 47.8  Vital Signs: Temp: 99 F (37.2 C) (12/27 1722) Temp Source: Oral (12/27 1722) BP: 128/59 (12/27 2245) Pulse Rate: 82 (12/27 2245)  Labs: Recent Labs    07/06/21 1647 07/06/21 2027  HGB 8.1*  --   HCT 29.7*  --   PLT 268  --   CREATININE 7.00*  --   TROPONINIHS 43* 52*    Estimated Creatinine Clearance: 4.8 mL/min (A) (by C-G formula based on SCr of 7 mg/dL (H)).   Medical History: Past Medical History:  Diagnosis Date   Acute on chronic respiratory failure with hypoxia ([place]) 10/10/2016   Anxiety    Arthritis    AVM (arteriovenous malformation) of colon    CAD (coronary artery disease)    a. s/p CABG in 2013 b. DES to D1 in 10/2016. c. cath in 07/2018 showing patent grafts with occlusion of D1 at prior stent site and progression of PDA disease --> medical management recommended   Carotid artery disease ([place])    a. 23-76% LICA, 08/8313    Chronic anemia    Chronic bronchitis ([facility])    Chronic diastolic CHF (congestive heart failure) ([place])    a. 02/2012 Echo EF 60-65%, nl wall motion, Gr 1 DD, mod MR   Colon cancer ([place]) 1992   Esophageal stricture    ESRD on hemodialysis ([place])    ESRD due to  HTN, started dialysis 2011 and gets HD at [facility] with Dr Hinda Lenis on MWF schedule.  Access is LUA AVF as of Sept 2014.    GERD (gastroesophageal reflux disease)    High cholesterol 12/2011   History of blood transfusion 07/2011; 12/2011; 01/2012 X 2; 04/2012   History of gout    History of lower GI bleeding    Hypertension    Iron deficiency anemia    Jugular vein occlusion, right (H[facility])    Mitral regurgitation    a. Moderate by echo, 02/2012   Myocardial infarction [facility])    NSVT (nonsustained ventricular tachycardia)    Ovarian cancer ([place]) 1992   PAF (paroxysmal atrial fibrillation) ([place])    Pneumonia ~ 2009   PUD (peptic ulcer disease)    TIA (transient ischemic attack)      Assessment: 80yo female c/o intermittent CP over the past few weeks that was relieved each time with NTG, today had worse CP unrelieved w/ NTG, troponin mildly elevated and rising, to begin heparin.  Goal of Therapy:  Heparin level 0.3-0.7 units/ml Monitor platelets by anticoagulation protocol: Yes   Plan:  Heparin 2000 units IV bolus x1 followed by gtt at 650 units/hr and monitor heparin levels and CBC.  Wynona Neat, PharmD, BCPS  07/06/2021,11:41 PM

## 2021-07-07 ENCOUNTER — Encounter (HOSPITAL_COMMUNITY): Payer: Self-pay | Admitting: Internal Medicine

## 2021-07-07 DIAGNOSIS — E119 Type 2 diabetes mellitus without complications: Secondary | ICD-10-CM | POA: Diagnosis not present

## 2021-07-07 DIAGNOSIS — R079 Chest pain, unspecified: Secondary | ICD-10-CM | POA: Diagnosis not present

## 2021-07-07 DIAGNOSIS — I2510 Atherosclerotic heart disease of native coronary artery without angina pectoris: Secondary | ICD-10-CM | POA: Diagnosis not present

## 2021-07-07 LAB — BASIC METABOLIC PANEL
Anion gap: 14 (ref 5–15)
BUN: 43 mg/dL — ABNORMAL HIGH (ref 8–23)
CO2: 25 mmol/L (ref 22–32)
Calcium: 9.2 mg/dL (ref 8.9–10.3)
Chloride: 104 mmol/L (ref 98–111)
Creatinine, Ser: 7.45 mg/dL — ABNORMAL HIGH (ref 0.44–1.00)
GFR, Estimated: 5 mL/min — ABNORMAL LOW (ref >60)
Glucose, Bld: 94 mg/dL (ref 70–99)
Potassium: 3.7 mmol/L (ref 3.5–5.1)
Sodium: 143 mmol/L (ref 135–145)

## 2021-07-07 LAB — CBC
HCT: 25.9 % — ABNORMAL LOW (ref 36.0–46.0)
Hemoglobin: 8 g/dL — ABNORMAL LOW (ref 12.0–15.0)
MCH: 38.5 pg — ABNORMAL HIGH (ref 26.0–34.0)
MCHC: 30.9 g/dL (ref 30.0–36.0)
MCV: 124.5 fL — ABNORMAL HIGH (ref 80.0–100.0)
RBC: 2.08 MIL/uL — ABNORMAL LOW (ref 3.87–5.11)
RDW: 17.2 % — ABNORMAL HIGH (ref 11.5–15.5)
WBC: 5.9 10*3/uL (ref 4.0–10.5)
nRBC: 0 % (ref 0.0–0.2)

## 2021-07-07 LAB — RESP PANEL BY RT-PCR (FLU A&B, COVID) ARPGX2
Influenza A by PCR: NEGATIVE
Influenza B by PCR: NEGATIVE
SARS Coronavirus 2 by RT PCR: NEGATIVE

## 2021-07-07 LAB — TROPONIN I (HIGH SENSITIVITY)
Troponin I (High Sensitivity): 52 ng/L — ABNORMAL HIGH (ref <18)
Troponin I (High Sensitivity): 48 ng/L — ABNORMAL HIGH (ref <18)
Troponin I (High Sensitivity): 33 ng/L — ABNORMAL HIGH (ref <18)

## 2021-07-07 LAB — HEPARIN LEVEL (UNFRACTIONATED): Heparin Unfractionated: 0.2 IU/mL — ABNORMAL LOW (ref 0.30–0.70)

## 2021-07-07 LAB — HEPATITIS B SURFACE ANTIGEN: Hepatitis B Surface Ag: NONREACTIVE

## 2021-07-07 MED ORDER — ASPIRIN EC 81 MG PO TBEC
81.0000 mg | DELAYED_RELEASE_TABLET | Freq: Every day | ORAL | Status: DC
  Administered 2021-07-07 – 2021-07-08 (×2): 81 mg via ORAL
  Filled 2021-07-07 (×2): qty 1

## 2021-07-07 MED ORDER — MIDODRINE HCL 5 MG PO TABS
5.0000 mg | ORAL_TABLET | ORAL | Status: DC
  Administered 2021-07-07: 09:00:00 5 mg via ORAL
  Filled 2021-07-07: qty 1

## 2021-07-07 MED ORDER — PENTAFLUOROPROP-TETRAFLUOROETH EX AERO
1.0000 "application " | INHALATION_SPRAY | CUTANEOUS | Status: DC | PRN
  Filled 2021-07-07: qty 116

## 2021-07-07 MED ORDER — SODIUM CHLORIDE 0.9 % IV SOLN: 100.0000 mL | INTRAVENOUS | Status: DC | PRN

## 2021-07-07 MED ORDER — HEPARIN SODIUM (PORCINE) 1000 UNIT/ML DIALYSIS
1000.0000 [IU] | INTRAMUSCULAR | Status: DC | PRN
  Filled 2021-07-07: qty 1

## 2021-07-07 MED ORDER — RENA-VITE PO TABS
1.0000 | ORAL_TABLET | Freq: Every day | ORAL | Status: DC
  Administered 2021-07-07: 22:00:00 1 via ORAL
  Filled 2021-07-07 (×2): qty 1

## 2021-07-07 MED ORDER — LIDOCAINE-PRILOCAINE 2.5-2.5 % EX CREA
1.0000 "application " | TOPICAL_CREAM | CUTANEOUS | Status: DC | PRN
  Filled 2021-07-07: qty 5

## 2021-07-07 MED ORDER — ALTEPLASE 2 MG IJ SOLR: 2.0000 mg | Freq: Once | INTRAMUSCULAR | Status: DC | PRN

## 2021-07-07 MED ORDER — ALPRAZOLAM 0.5 MG PO TABS
0.5000 mg | ORAL_TABLET | Freq: Every day | ORAL | Status: DC
  Filled 2021-07-07: qty 1

## 2021-07-07 MED ORDER — HYDROXYZINE HCL 50 MG PO TABS
100.0000 mg | ORAL_TABLET | Freq: Every day | ORAL | Status: DC | PRN
  Filled 2021-07-07: qty 2

## 2021-07-07 MED ORDER — CHLORHEXIDINE GLUCONATE CLOTH 2 % EX PADS
6.0000 | MEDICATED_PAD | Freq: Every day | CUTANEOUS | Status: DC
  Administered 2021-07-08: 05:00:00 6 via TOPICAL

## 2021-07-07 MED ORDER — LIDOCAINE HCL (PF) 1 % IJ SOLN: 5.0000 mL | INTRAMUSCULAR | Status: DC | PRN

## 2021-07-07 MED ORDER — ISOSORBIDE MONONITRATE ER 30 MG PO TB24
90.0000 mg | ORAL_TABLET | Freq: Two times a day (BID) | ORAL | Status: DC
  Administered 2021-07-07 (×2): 90 mg via ORAL
  Filled 2021-07-07 (×2): qty 3

## 2021-07-07 MED ORDER — NITROGLYCERIN 0.4 MG SL SUBL: 0.4000 mg | SUBLINGUAL_TABLET | SUBLINGUAL | Status: DC | PRN

## 2021-07-07 MED ORDER — METOPROLOL TARTRATE 50 MG PO TABS
50.0000 mg | ORAL_TABLET | Freq: Two times a day (BID) | ORAL | Status: DC
  Administered 2021-07-07 – 2021-07-08 (×2): 50 mg via ORAL
  Filled 2021-07-07: qty 2
  Filled 2021-07-07 (×2): qty 1

## 2021-07-07 MED ORDER — AMIODARONE HCL 200 MG PO TABS
200.0000 mg | ORAL_TABLET | Freq: Every day | ORAL | Status: DC
  Administered 2021-07-07 – 2021-07-08 (×2): 200 mg via ORAL
  Filled 2021-07-07 (×2): qty 1

## 2021-07-07 MED ORDER — SEVELAMER CARBONATE 800 MG PO TABS
2400.0000 mg | ORAL_TABLET | Freq: Three times a day (TID) | ORAL | Status: DC
  Administered 2021-07-07 – 2021-07-08 (×3): 2400 mg via ORAL
  Filled 2021-07-07 (×3): qty 3

## 2021-07-07 MED ORDER — ISOSORBIDE MONONITRATE ER 60 MG PO TB24
120.0000 mg | ORAL_TABLET | Freq: Two times a day (BID) | ORAL | Status: DC
  Administered 2021-07-08: 10:00:00 120 mg via ORAL
  Filled 2021-07-07 (×2): qty 2

## 2021-07-07 MED ORDER — ROSUVASTATIN CALCIUM 5 MG PO TABS
10.0000 mg | ORAL_TABLET | Freq: Every day | ORAL | Status: DC
  Administered 2021-07-07 – 2021-07-08 (×2): 10 mg via ORAL
  Filled 2021-07-07 (×2): qty 2

## 2021-07-07 MED ORDER — MELATONIN 3 MG PO TABS
3.0000 mg | ORAL_TABLET | Freq: Once | ORAL | Status: AC
  Administered 2021-07-07: 3 mg via ORAL
  Filled 2021-07-07: qty 1

## 2021-07-07 MED ORDER — LEVOTHYROXINE SODIUM 25 MCG PO TABS
25.0000 ug | ORAL_TABLET | Freq: Every day | ORAL | Status: DC
  Administered 2021-07-07 – 2021-07-08 (×2): 25 ug via ORAL
  Filled 2021-07-07 (×2): qty 1

## 2021-07-07 MED ORDER — OMEPRAZOLE 20 MG PO CPDR
20.0000 mg | DELAYED_RELEASE_CAPSULE | Freq: Every day | ORAL | Status: DC
  Administered 2021-07-07 – 2021-07-08 (×2): 20 mg via ORAL
  Filled 2021-07-07 (×2): qty 1

## 2021-07-07 NOTE — Progress Notes (Signed)
Per d/w Dr. Johney Frame -> repeat troponin this AM to trend for any increase.

## 2021-07-07 NOTE — H&P (Signed)
History and Physical    TNYA ADES MMH:[id number] DOB: 05-02-1941 DOA: 07/06/2021  PCP: [facility]  Patient coming from: Home.  Chief Complaint: Chest pain.  HPI: Shawna [name] is a 80 y.o. female with history of CAD status post CABG in 2013, stent placement in 2018 followed by cardiac cath in January 2020 showing patent grafts with occlusion of the D1 and prior stent side with progression of disease medically managed with history of ESRD has been experiencing chest pain mostly in the night before going to bed over the last 2 nights.  Chest pain resolved with nitroglycerin but has not been happening more than usual.  Denies any shortness of breath fever chills productive cough.  Given the symptoms and concerning features patient presents to the ER.  Patient did not have any chest pain yesterday.  ED Course: In the ER labs show high sensitive troponin of 43 and 52 hemoglobin 8.0 which is at baseline creatinine was 7 potassium of 4.2 COVID test negative EKG showing normal sinus rhythm with LVH.  ER physician discussed with on-call cardiologist patient was started on heparin drip and admitted for further work-up concerning for unstable angina.  At the time of my exam patient is chest pain-free.  COVID test negative.  Review of Systems: As per HPI, rest all negative.   Past Medical History:  Diagnosis Date   Acute on chronic respiratory failure with hypoxia ([place]) 10/10/2016   Anxiety    Arthritis    AVM (arteriovenous malformation) of colon    CAD (coronary artery disease)    a. s/p CABG in 2013 b. DES to D1 in 10/2016. c. cath in 07/2018 showing patent grafts with occlusion of D1 at prior stent site and progression of PDA disease --> medical management recommended   Carotid artery disease ([place])    a. 15-94% LICA, 11/8590    Chronic anemia    Chronic bronchitis (HCC)    Chronic diastolic CHF (congestive heart failure) ([place])    a. 02/2012 Echo EF 60-65%, nl wall motion, Gr 1  DD, mod MR   Colon cancer ([place]) 1992   Esophageal stricture    ESRD on hemodialysis ([place])    ESRD due to HTN, started dialysis 2011 and gets HD at [facility] with Dr Hinda Lenis on MWF schedule.  Access is LUA AVF as of Sept 2014.    GERD (gastroesophageal reflux disease)    High cholesterol 12/2011   History of blood transfusion 07/2011; 12/2011; 01/2012 X 2; 04/2012   History of gout    History of lower GI bleeding    Hypertension    Iron deficiency anemia    Jugular vein occlusion, right (HCC)    Mitral regurgitation    a. Moderate by echo, 02/2012   Myocardial infarction [facility])    NSVT (nonsustained ventricular tachycardia)    Ovarian cancer ([place]) 1992   PAF (paroxysmal atrial fibrillation) ([place])    Pneumonia ~ 2009   PUD (peptic ulcer disease)    TIA (transient ischemic attack)     Past Surgical History:  Procedure Laterality Date   A/V SHUNTOGRAM Left 03/19/2019   Procedure: A/V SHUNTOGRAM;  Surgeon: Katha Cabal, MD;  Location: [place] [facility];  Service: Cardiovascular;  Laterality: Left;   ABDOMINAL HYSTERECTOMY  1992   APPENDECTOMY  06/1990   AV FISTULA PLACEMENT  07/2009   left upper arm   AV FISTULA PLACEMENT Right 09/06/2016   Procedure: RIGHT FOREARM ARTERIOVENOUS (AV) GRAFT;  Surgeon: Elam Dutch, MD;  [facility];  Service: Vascular;  Laterality: Right;   AV FISTULA PLACEMENT N/A 02/24/2017   Procedure: INSERTION OF ARTERIOVENOUS (AV) GORE-TEX GRAFT ARM (BRACHIAL AXILLARY);  Surgeon: Katha Cabal, MD;  Location: [facility] ORS;  Service: Vascular;  Laterality: N/A;   [place] Right 09/06/2016   Procedure: REMOVAL OF Right Arm ARTERIOVENOUS GORETEX GRAFT and Vein Patch angioplasty of brachial artery;  Surgeon: Angelia Mould, MD;  Location: [place];  Service: Vascular;  Laterality: Right;   BIOPSY  09/26/2019   Procedure: BIOPSY;  Surgeon: Rogene Houston, MD;  [facility];  Service: Endoscopy;;   COLON RESECTION  1992   COLON SURGERY      COLONOSCOPY N/A 03/09/2019   Procedure: COLONOSCOPY;  Surgeon: Rogene Houston, MD;  [facility];  Service: Endoscopy;  Laterality: N/A;   COLONOSCOPY WITH PROPOFOL N/A 06/08/2021   Procedure: COLONOSCOPY WITH PROPOFOL;  Surgeon: Harvel Quale, MD;  [facility];  Service: Gastroenterology;  Laterality: N/A;  9:05 /Patient is on dialysis Mon Wed Fri   CORONARY ANGIOPLASTY WITH STENT PLACEMENT  12/15/11   "2"   CORONARY ANGIOPLASTY WITH STENT PLACEMENT  y/2013   "1; makes total of 3" (05/02/2012)   CORONARY ARTERY BYPASS GRAFT  06/13/2012   Procedure: CORONARY ARTERY BYPASS GRAFTING (CABG);  Surgeon: Grace Isaac, MD;  Location: [place];  Service: Open Heart Surgery;  Laterality: N/A;  cabg x four;  using left internal mammary artery, and left leg greater saphenous vein harvested endoscopically   CORONARY STENT INTERVENTION N/A 10/13/2016   Procedure: Coronary Stent Intervention;  Surgeon: Troy Sine, MD;  Location: [place] [facility] LAB;  Service: Cardiovascular;  Laterality: N/A;   DIALYSIS/PERMA CATHETER REMOVAL N/A 04/18/2017   Procedure: DIALYSIS/PERMA CATHETER REMOVAL;  Surgeon: Katha Cabal, MD;  Location: [place] [facility];  Service: Cardiovascular;  Laterality: N/A;   DILATION AND CURETTAGE OF UTERUS     ENTEROSCOPY N/A 06/08/2021   Procedure: PUSH ENTEROSCOPY;  Surgeon: Harvel Quale, MD;  [facility];  Service: Gastroenterology;  Laterality: N/A;   ESOPHAGOGASTRODUODENOSCOPY  01/20/2012   Procedure: ESOPHAGOGASTRODUODENOSCOPY (EGD);  Surgeon: Ladene Artist, MD,FACG;  Location: [facility] ENDOSCOPY;  Service: Endoscopy;  Laterality: N/A;   ESOPHAGOGASTRODUODENOSCOPY N/A 03/26/2013   Procedure: ESOPHAGOGASTRODUODENOSCOPY (EGD);  Surgeon: Irene Shipper, MD;  Location: [facility] ENDOSCOPY;  Service: Endoscopy;  Laterality: N/A;   ESOPHAGOGASTRODUODENOSCOPY N/A 04/30/2015   Procedure: ESOPHAGOGASTRODUODENOSCOPY (EGD);  Surgeon: Rogene Houston, MD;  [facility];  Service: Endoscopy;  Laterality: N/A;  1pm - moved to 10/20 @ 1:10   ESOPHAGOGASTRODUODENOSCOPY N/A 07/29/2016   Procedure: ESOPHAGOGASTRODUODENOSCOPY (EGD);  Surgeon: Manus Gunning, MD;  Location: [place];  Service: Gastroenterology;  Laterality: N/A;  enteroscopy   ESOPHAGOGASTRODUODENOSCOPY N/A 09/26/2019   Procedure: ESOPHAGOGASTRODUODENOSCOPY (EGD);  Surgeon: Rogene Houston, MD;  [facility];  Service: Endoscopy;  Laterality: N/A;  1250   ESOPHAGOGASTRODUODENOSCOPY (EGD) WITH PROPOFOL N/A 02/05/2021   Procedure: ESOPHAGOGASTRODUODENOSCOPY (EGD) WITH PROPOFOL;  Surgeon: Eloise Harman, DO;  [facility];  Service: Endoscopy;  Laterality: N/A;   GIVENS CAPSULE STUDY N/A 03/07/2019   Procedure: GIVENS CAPSULE STUDY;  Surgeon: Rogene Houston, MD;  [facility];  Service: Endoscopy;  Laterality: N/A;  7:30   GIVENS CAPSULE STUDY N/A 04/22/2021   Procedure: GIVENS CAPSULE STUDY;  Surgeon: Rogene Houston, MD;  [facility];  Service: Endoscopy;  Laterality: N/A;  7:30   INSERTION OF DIALYSIS CATHETER N/A 10/05/2020   Procedure: ABORTED TUNNELED DIALYSIS CATHETER PLACEMENT RIGHT INTERNAL JUGULAR VEIN ;  Surgeon: Virl Cagey, MD;  Location: [facility];  Service: General;  Laterality: N/A;   INTRAOPERATIVE TRANSESOPHAGEAL ECHOCARDIOGRAM  06/13/2012   Procedure: INTRAOPERATIVE TRANSESOPHAGEAL ECHOCARDIOGRAM;  Surgeon: Grace Isaac, MD;  Location: [place];  Service: Open Heart Surgery;  Laterality: N/A;   IR DIALY SHUNT INTRO NEEDLE/INTRACATH INITIAL W/IMG LEFT Left 10/06/2020   IR FLUORO GUIDE CV LINE RIGHT  06/17/2020   IR GENERIC HISTORICAL  07/26/2016   IR FLUORO GUIDE CV LINE RIGHT 07/26/2016 Greggory Keen, MD [facility]   IR GENERIC HISTORICAL  07/26/2016   IR US GUIDE VASC ACCESS RIGHT 07/26/2016 Greggory Keen, MD [facility]   IR GENERIC HISTORICAL  08/02/2016   IR US GUIDE VASC ACCESS  RIGHT 08/02/2016 Greggory Keen, MD [facility]   IR GENERIC HISTORICAL  08/02/2016   IR FLUORO GUIDE CV LINE RIGHT 08/02/2016 Greggory Keen, MD [facility]   IR RADIOLOGY PERIPHERAL GUIDED IV START  03/28/2017   IR REMOVAL TUN CV CATH W/O FL  08/11/2020   IR THROMBECTOMY AV FISTULA W/THROMBOLYSIS INC/SHUNT/IMG LEFT Left 06/17/2020   IR US GUIDE VASC ACCESS LEFT  06/17/2020   IR US GUIDE VASC ACCESS RIGHT  03/28/2017   IR US GUIDE VASC ACCESS RIGHT  06/17/2020   LEFT HEART CATH AND CORONARY ANGIOGRAPHY N/A 09/20/2016   Procedure: Left Heart Cath and Coronary Angiography;  Surgeon: [name], MD;  Location: [place] [facility];  Service: Cardiovascular;  Laterality: N/A;   LEFT HEART CATH AND CORS/GRAFTS ANGIOGRAPHY N/A 10/13/2016   Procedure: Left Heart Cath and Cors/Grafts Angiography;  Surgeon: Troy Sine, MD;  Location: [place] [facility];  Service: Cardiovascular;  Laterality: N/A;   LEFT HEART CATH AND CORS/GRAFTS ANGIOGRAPHY N/A 07/13/2018   Procedure: LEFT HEART CATH AND CORS/GRAFTS ANGIOGRAPHY;  Surgeon: Martinique, Peter M, MD;  Location: [place] [facility];  Service: Cardiovascular;  Laterality: N/A;   LEFT HEART CATHETERIZATION WITH CORONARY ANGIOGRAM N/A 12/15/2011   Procedure: LEFT HEART CATHETERIZATION WITH CORONARY ANGIOGRAM;  Surgeon: Burnell Blanks, MD;  Location: [facility] CATH LAB;  Service: Cardiovascular;  Laterality: N/A;   LEFT HEART CATHETERIZATION WITH CORONARY ANGIOGRAM N/A 01/10/2012   Procedure: LEFT HEART CATHETERIZATION WITH CORONARY ANGIOGRAM;  Surgeon: Peter M Martinique, MD;  Location: [facility] CATH LAB;  Service: Cardiovascular;  Laterality: N/A;   LEFT HEART CATHETERIZATION WITH CORONARY ANGIOGRAM N/A 06/08/2012   Procedure: LEFT HEART CATHETERIZATION WITH CORONARY ANGIOGRAM;  Surgeon: Burnell Blanks, MD;  Location: [facility] CATH LAB;  Service: Cardiovascular;  Laterality: N/A;   LEFT HEART CATHETERIZATION WITH CORONARY/GRAFT ANGIOGRAM N/A 12/10/2013   Procedure: LEFT HEART  CATHETERIZATION WITH Beatrix Fetters;  Surgeon: [name], MD;  Location: [facility] CATH LAB;  Service: Cardiovascular;  Laterality: N/A;   OVARY SURGERY     ovarian cancer   POLYPECTOMY  03/09/2019   Procedure: POLYPECTOMY;  Surgeon: Rogene Houston, MD;  [facility];  Service: Endoscopy;;  cecal    POLYPECTOMY N/A 09/26/2019   Procedure: DUODENAL POLYPECTOMY;  Surgeon: Rogene Houston, MD;  [facility];  Service: Endoscopy;  Laterality: N/A;   REVISION OF ARTERIOVENOUS GORETEX GRAFT N/A 02/24/2017   Procedure: REVISION OF ARTERIOVENOUS GORETEX GRAFT (RESECTION);  Surgeon: Katha Cabal, MD;  Location: [facility] ORS;  Service: Vascular;  Laterality: N/A;  REVISON OF ARTERIOVENOUS FISTULA Left 06/19/2020   Procedure: REVISION OF LEFT UPPER ARM AV GRAFT WITH INTERPOSITION JUMP GRAFT USING 6MM GORE LIMB;  Surgeon: Marty Heck, MD;  Location: [place];  Service: Vascular;  Laterality: Left;   SHUNTOGRAM N/A 10/15/2013   Procedure: Fistulogram;  Surgeon: Serafina Mitchell, MD;  Location: [facility] CATH LAB;  Service: Cardiovascular;  Laterality: N/A;   THROMBECTOMY / ARTERIOVENOUS GRAFT REVISION  2011   left upper arm   TUBAL LIGATION  1980's   UPPER EXTREMITY ANGIOGRAPHY Bilateral 12/06/2016   Procedure: Upper Extremity Angiography;  Surgeon: Katha Cabal, MD;  Location: [place] [facility];  Service: Cardiovascular;  Laterality: Bilateral;   UPPER EXTREMITY INTERVENTION Left 06/06/2017   Procedure: UPPER EXTREMITY INTERVENTION;  Surgeon: Katha Cabal, MD;  Location: [place] [facility] LAB;  Service: Cardiovascular;  Laterality: Left;     reports that she has never smoked. She has never used smokeless tobacco. She reports that she does not drink alcohol and does not use drugs.  Allergies  Allergen Reactions   Amlodipine Swelling   Aspirin Other (See Comments)    High Doses Mess up her stomach; "makes my bowels have blood in them". Takes 81 mg EC  Aspirin    Nitrofurantoin Hives   Penicillins Other (See Comments)    SYNCOPE? , "makes me real weak when I take it; like I'll pass out"  Has patient had a PCN reaction causing immediate rash, facial/tongue/throat swelling, SOB or lightheadedness with hypotension: Yes Has patient had a PCN reaction causing severe rash involving mucus membranes or skin necrosis: no Has patient had a PCN reaction that required hospitalization no Has patient had a PCN reaction occurring within the last 10 years: no If all of the above    Bactrim [Sulfamethoxazole-Trimethoprim] Rash   Contrast Media [Iodinated Contrast Media] Itching   Iron Itching and Other (See Comments)    "they gave me iron in dialysis; had to give me Benadryl cause I had to have the iron" (05/02/2012)   Tylenol [Acetaminophen] Itching and Other (See Comments)    Makes her feet on fire per pt   Gabapentin Other (See Comments)    Unknown reaction   Iron Sucrose    No Known Allergies    Ranexa [Ranolazine] Other (See Comments)    Myoclonus-hospitalized    Sucroferric Oxyhydroxide    Clopidogrel Rash   Dexilant [Dexlansoprazole] Other (See Comments)    Upset stomach   Hydralazine Itching    Has tolerated while inpatient Has tolerated while inpatient   Levaquin [Levofloxacin In D5w] Rash   Levofloxacin Rash   Morphine Itching   Morphine And Related Itching and Other (See Comments)    Itching in feet   Pantoprazole Rash   Plavix [Clopidogrel Bisulfate] Rash   Protonix [Pantoprazole Sodium] Rash   Venofer [Ferric Oxide] Itching and Other (See Comments)    Patient reports using Benadryl prior to doses as [facility]    Family History  Problem Relation Age of Onset   Heart disease Mother        Heart Disease before age 65   Hyperlipidemia Mother    Hypertension Mother    Diabetes Mother    Heart attack Mother    Heart disease Father        Heart Disease before age 67   Hyperlipidemia Father    Hypertension Father     Diabetes Father    Diabetes Sister    Hypertension Sister  Diabetes Brother    Hyperlipidemia Brother    Heart attack Brother    Hypertension Sister    Heart attack Brother    Colon cancer Child 32   Other Other        noncontributory for early CAD   Esophageal cancer Neg Hx    Liver disease Neg Hx    Kidney disease Neg Hx    Colon polyps Neg Hx     Prior to Admission medications   Medication Sig Start Date End Date Taking? Authorizing Provider  ALPRAZolam Duanne Moron) 0.5 MG tablet Take 0.5 mg by mouth at bedtime. 03/20/21  Yes [provider]  amiodarone (PACERONE) 200 MG tablet Take 1 tablet (200 mg total) by mouth daily. 05/13/21  Yes Strader, Fransisco Hertz, PA-C  aspirin EC 81 MG tablet Take 81 mg by mouth daily.  09/27/19  Yes Rehman, Mechele Dawley, MD  fluticasone (FLONASE) 50 MCG/ACT nasal spray Place 1 spray into both nostrils at bedtime as needed for allergies.    Yes [provider]  hydrOXYzine (VISTARIL) 100 MG capsule Take 100 mg by mouth daily as needed for anxiety. 11/23/20  Yes [provider]  isosorbide mononitrate (IMDUR) 60 MG 24 hr tablet Take 1.5 tablets (90 mg total) by mouth 2 (two) times daily. 06/22/21  Yes BranchAlphonse Guild, MD  levothyroxine (SYNTHROID) 25 MCG tablet Take 1 tablet (25 mcg total) by mouth daily. 02/02/21  Yes Brita Romp, NP  lidocaine-prilocaine (EMLA) cream Apply 1 application topically every Monday, Wednesday, and Friday. Prior to dialysis   Yes [provider]  loperamide (IMODIUM A-D) 2 MG tablet Take 2 tablets (4 mg total) by mouth daily as needed for diarrhea or loose stools. 12/17/20  Yes Thurnell Lose, MD  metoprolol tartrate (LOPRESSOR) 50 MG tablet Take 1 tablet (50 mg total) by mouth 2 (two) times daily. 05/13/21  Yes Strader, Fransisco Hertz, PA-C  midodrine (PROAMATINE) 5 MG tablet Take Monday, Wednesday, Friday morning BEFORE dialysis 12/22/20  Yes Branch, Alphonse Guild, MD  multivitamin (RENA-VIT) TABS  tablet Take 1 tablet by mouth daily.   Yes [provider]  nitroGLYCERIN (NITROSTAT) 0.4 MG SL tablet Place 1 tablet (0.4 mg total) under the tongue every 5 (five) minutes x 3 doses as needed for chest pain (if no relief after 2nd dose, proceed to the ED for an evaluation or call 911). 02/19/21  Yes Verta Ellen., NP  omeprazole (PRILOSEC) 20 MG capsule Take 1 capsule (20 mg total) by mouth daily. 12/01/20  Yes Rehman, Mechele Dawley, MD  rosuvastatin (CRESTOR) 10 MG tablet Take 1 tablet (10 mg total) by mouth daily. 05/13/21  Yes Strader, [place] M, PA-C  diclofenac Sodium (VOLTAREN) 1 % GEL Apply 2 g topically 4 (four) times daily as needed. Patient not taking: Reported on 07/06/2021 03/12/21   Margette Fast, MD  ferrous sulfate 325 (65 FE) MG tablet Take 1 tablet (325 mg total) by mouth daily. Patient not taking: Reported on 07/06/2021 06/24/21   Marcello Fennel, PA-C  Na Sulfate-K Sulfate-Mg Sulf (SUPREP BOWEL PREP KIT) 17.5-3.13-1.6 GM/177ML SOLN Take 1 kit by mouth as directed. 06/11/21   Harvel Quale, MD  sevelamer carbonate (RENVELA) 800 MG tablet Take 2 tablets (1,600 mg total) by mouth 3 (three) times daily with meals. Patient taking differently: Take 2,400 mg by mouth 3 (three) times daily with meals. 06/20/20   Meccariello, Bernita Raisin, DO    Physical Exam: Constitutional: Moderately built and  nourished. Vitals:   07/07/21 0000 07/07/21 0016 07/07/21 0030 07/07/21 0100  BP: (!) 140/46  (!) 134/47 (!) 139/38  Pulse: 71  73 70  Resp: _0 Temp:      TempSrc:      SpO2: 99%  98% 99%  Weight:  55.8 kg    Height:  _1  (1.549 m)     Eyes: Anicteric no pallor. ENMT: No discharge from the ears eyes nose and mouth. Neck: No mass felt.  No neck rigidity. Respiratory: No rhonchi or crepitations. Cardiovascular: S1-S2 heard. Abdomen: Soft nontender bowel sound present. Musculoskeletal: No edema. Skin: No rash. Neurologic: Alert awake oriented to time  place and person.  Moves all extremities. Psychiatric: Appears normal.  Normal affect.   Labs on Admission: I have personally reviewed following labs and imaging studies  CBC: Recent Labs  Lab 07/06/21 1647  WBC 7.9  HGB 8.1*  HCT 29.7*  MCV 137.5*  PLT 967   Basic Metabolic Panel: Recent Labs  Lab 07/06/21 1647  NA 138  K 4.2  CL 103  CO2 21*  GLUCOSE 76  BUN 38*  CREATININE 7.00*  CALCIUM 9.3  MG 1.9   GFR: Estimated Creatinine Clearance: 4.8 mL/min (A) (by C-G formula based on SCr of 7 mg/dL (H)). Liver Function Tests: Recent Labs  Lab 07/06/21 1647  AST 10*  ALT 9  ALKPHOS 142*  BILITOT 0.5  PROT 6.8  ALBUMIN 3.5   Recent Labs  Lab 07/06/21 1647  LIPASE 43   No results for input(s): AMMONIA in the last 168 hours. Coagulation Profile: No results for input(s): INR, PROTIME in the last 168 hours. Cardiac Enzymes: No results for input(s): CKTOTAL, CKMB, CKMBINDEX, TROPONINI in the last 168 hours. BNP (last 3 results) No results for input(s): PROBNP in the last 8760 hours. HbA1C: No results for input(s): HGBA1C in the last 72 hours. CBG: No results for input(s): GLUCAP in the last 168 hours. Lipid Profile: No results for input(s): CHOL, HDL, LDLCALC, TRIG, CHOLHDL, LDLDIRECT in the last 72 hours. Thyroid Function Tests: No results for input(s): TSH, T4TOTAL, FREET4, T3FREE, THYROIDAB in the last 72 hours. Anemia Panel: No results for input(s): VITAMINB12, FOLATE, FERRITIN, TIBC, IRON, RETICCTPCT in the last 72 hours. Urine analysis:    Component Value Date/Time   COLORURINE YELLOW 12/16/2012 1919   APPEARANCEUR CLOUDY (A) 12/16/2012 1919   LABSPEC 1.009 12/16/2012 1919   PHURINE 7.5 12/16/2012 1919   GLUCOSEU NEGATIVE 12/16/2012 1919   HGBUR TRACE (A) 12/16/2012 1919   BILIRUBINUR NEGATIVE 12/16/2012 1919   KETONESUR NEGATIVE 12/16/2012 1919   PROTEINUR 100 (A) 12/16/2012 1919   UROBILINOGEN 0.2 12/16/2012 1919   NITRITE NEGATIVE 12/16/2012  1919   LEUKOCYTESUR SMALL (A) 12/16/2012 1919   Sepsis Labs: _2 (procalcitonin:4,lacticidven:4) ) Recent Results (from the past 240 hour(s))  Resp Panel by RT-PCR (Flu A&B, Covid) Nasopharyngeal Swab     Status: None   Collection Time: 07/06/21 11:39 PM   Specimen: Nasopharyngeal Swab; Nasopharyngeal(NP) swabs in vial transport medium  Result Value Ref Range Status   SARS Coronavirus 2 by RT PCR NEGATIVE NEGATIVE Final    Comment: (NOTE) SARS-CoV-2 target nucleic acids are NOT DETECTED.  The SARS-CoV-2 RNA is generally detectable in upper respiratory specimens during the acute phase of infection. The lowest concentration of SARS-CoV-2 viral copies this assay can detect is 138 copies/mL. A negative result does not preclude SARS-Cov-2 infection and should not be used as the sole basis for  treatment or other patient management decisions. A negative result may occur with  improper specimen collection/handling, submission of specimen other than nasopharyngeal swab, presence of viral mutation(s) within the areas targeted by this assay, and inadequate number of viral copies(<138 copies/mL). A negative result must be combined with clinical observations, patient history, and epidemiological information. The expected result is Negative.  Fact Sheet for Patients:  EntrepreneurPulse.com.au  Fact Sheet for Healthcare Providers:  IncredibleEmployment.be  This test is no t yet approved or cleared by the [place] FDA and  has been authorized for detection and/or diagnosis of SARS-CoV-2 by FDA under an Emergency Use Authorization (EUA). This EUA will remain  in effect (meaning this test can be used) for the duration of the COVID-19 declaration under Section 564(b)(1) of the Act, 21 U.S.C.section 360bbb-3(b)(1), unless the authorization is terminated  or revoked sooner.       Influenza A by PCR NEGATIVE NEGATIVE Final   Influenza B by PCR  NEGATIVE NEGATIVE Final    Comment: (NOTE) The Xpert Xpress SARS-CoV-2/FLU/RSV plus assay is intended as an aid in the diagnosis of influenza from Nasopharyngeal swab specimens and should not be used as a sole basis for treatment. Nasal washings and aspirates are unacceptable for Xpert Xpress SARS-CoV-2/FLU/RSV testing.  Fact Sheet for Patients: EntrepreneurPulse.com.au  Fact Sheet for Healthcare Providers: IncredibleEmployment.be  This test is not yet approved or cleared by the [place] FDA and has been authorized for detection and/or diagnosis of SARS-CoV-2 by FDA under an Emergency Use Authorization (EUA). This EUA will remain in effect (meaning this test can be used) for the duration of the COVID-19 declaration under Section 564(b)(1) of the Act, 21 U.S.C. section 360bbb-3(b)(1), unless the authorization is terminated or revoked.  Performed at [facility], [place] [address] [place], [place] [address]      Radiological Exams on Admission: DG Chest 2 View  Result Date: 07/06/2021 CLINICAL DATA:  Pt states she has been having intermittent chest pain, but today is worse. States pain is in back as well. Notes from triage: Pt reports a few weeks of intermittent chest pain, relieved with nitro. EXAM: CHEST - 2 VIEW COMPARISON:  06/17/2021 FINDINGS: Linear scarring or subsegmental atelectasis in the lung bases and right mid lung. Heart size upper limits normal. CABG markers and coronary stents. Aortic Atherosclerosis (ICD10-170.0). No effusion.  No pneumothorax. Sternotomy wires. IMPRESSION: No acute disease post CABG. Electronically Signed   By: Lucrezia Europe M.D.   On: 07/06/2021 18:30    EKG: Independently reviewed.  Normal sinus rhythm LVH.  Assessment/Plan Principal Problem:   Chest pain Active Problems:   ESRD on hemodialysis (HCC)   Hx of CABG   Hypertension   Diabetes mellitus type 2 in nonobese [facility])   Atrial fibrillation  (HCC)    Chest pain with prior history of CAD status post CABG and PCI last cardiac cath in 2020 presently chest pain-free ER physician discussed with on-call cardiologist patient was started on heparin concerning for possible unstable angina.  Patient is also on aspirin and statins. ESRD on hemodialysis on Monday Wednesday Friday consult nephrology for dialysis. Chronic anemia likely from ESRD hemoglobin is at baseline. Chronic diastolic CHF fluid management per nephrology during dialysis. History of paroxysmal atrial fibrillation not on anticoagulation secondary to GI bleed presently on amiodarone and metoprolol.  Presently in sinus rhythm. Hypothyroidism on Synthroid.   DVT prophylaxis: Heparin infusion. Code Status: Full code. Family Communication: Discussed with patient. Disposition Plan: Home. Consults called:  ER physician discussed with cardiologist. Admission status: Observation.   Rise Patience MD [facility] Pager [phone].  If 7PM-7AM, please contact night-coverage [email or web address] Password [facility]  07/07/2021, 2:24 AM

## 2021-07-07 NOTE — Progress Notes (Signed)
Hemodialysis- Tolerated well without issue. 1.5L removed. Report called to 3E.

## 2021-07-07 NOTE — ED Notes (Signed)
Pt to dialysis at this time

## 2021-07-07 NOTE — Plan of Care (Signed)
  Problem: Health Behavior/Discharge Planning: Goal: Ability to manage health-related needs will improve Outcome: Progressing   

## 2021-07-07 NOTE — ED Notes (Signed)
Walked patient to the bathroom patient did good

## 2021-07-07 NOTE — ED Notes (Signed)
Walked the patient to the bathroom and back to her room with no complaints from the patient. Patient had no trouble walking to the bathroom.

## 2021-07-07 NOTE — Progress Notes (Signed)
[place] for heparin Indication: chest pain/ACS  Allergies  Allergen Reactions   Amlodipine Swelling   Aspirin Other (See Comments)    High Doses Mess up her stomach; "makes my bowels have blood in them". Takes 81 mg EC Aspirin    Nitrofurantoin Hives   Penicillins Other (See Comments)    SYNCOPE? , "makes me real weak when I take it; like I'll pass out"  Has patient had a PCN reaction causing immediate rash, facial/tongue/throat swelling, SOB or lightheadedness with hypotension: Yes Has patient had a PCN reaction causing severe rash involving mucus membranes or skin necrosis: no Has patient had a PCN reaction that required hospitalization no Has patient had a PCN reaction occurring within the last 10 years: no If all of the above    Bactrim [Sulfamethoxazole-Trimethoprim] Rash   Contrast Media [Iodinated Contrast Media] Itching   Iron Itching and Other (See Comments)    "they gave me iron in dialysis; had to give me Benadryl cause I had to have the iron" (05/02/2012)   Tylenol [Acetaminophen] Itching and Other (See Comments)    Makes her feet on fire per pt   Gabapentin Other (See Comments)    Unknown reaction   Iron Sucrose    No Known Allergies    Ranexa [Ranolazine] Other (See Comments)    Myoclonus-hospitalized    Sucroferric Oxyhydroxide    Clopidogrel Rash   Dexilant [Dexlansoprazole] Other (See Comments)    Upset stomach   Hydralazine Itching    Has tolerated while inpatient Has tolerated while inpatient   Levaquin [Levofloxacin In D5w] Rash   Levofloxacin Rash   Morphine Itching   Morphine And Related Itching and Other (See Comments)    Itching in feet   Pantoprazole Rash   Plavix [Clopidogrel Bisulfate] Rash   Protonix [Pantoprazole Sodium] Rash   Venofer [Ferric Oxide] Itching and Other (See Comments)    Patient reports using Benadryl prior to doses as [place]    Patient Measurements: Height: 5\' 1"  (154.9  cm) Weight: 55.8 kg (123 lb) (pt weighed at dialysis yesterday) IBW/kg (Calculated) : 47.8  Vital Signs: BP: 139/40 (12/28 0700) Pulse Rate: 69 (12/28 0700)  Labs: Recent Labs    07/06/21 1647 07/06/21 2027 07/07/21 0247 07/07/21 0359 07/07/21 0746  HGB 8.1*  --   --  8.0*  --   HCT 29.7*  --   --  25.9*  --   PLT 268  --   --  PLATELET CLUMPS OBSERVED ON SMEAR REVIEW  --   HEPARINUNFRC  --   --   --   --  0.20*  CREATININE 7.00*  --  7.45*  --   --   TROPONINIHS 43* 52* 52* 48*  --      Estimated Creatinine Clearance: 4.5 mL/min (A) (by C-G formula based on SCr of 7.45 mg/dL (H)).   Medical History: Past Medical History:  Diagnosis Date   Acute on chronic respiratory failure with hypoxia ([place]) 10/10/2016   Anxiety    Arthritis    AVM (arteriovenous malformation) of colon    CAD (coronary artery disease)    a. s/p CABG in 2013 b. DES to D1 in 10/2016. c. cath in 07/2018 showing patent grafts with occlusion of D1 at prior stent site and progression of PDA disease --> medical management recommended   Carotid artery disease ([place])    a. 16-10% LICA, 03/6044    Chronic anemia    Chronic  bronchitis (H[facility])    Chronic diastolic CHF (congestive heart failure) ([place])    a. 02/2012 Echo EF 60-65%, nl wall motion, Gr 1 DD, mod MR   Colon cancer ([place]) 1992   Esophageal stricture    ESRD on hemodialysis ([place])    ESRD due to HTN, started dialysis 2011 and gets HD at [facility] with Dr Hinda Lenis on MWF schedule.  Access is LUA AVF as of Sept 2014.    GERD (gastroesophageal reflux disease)    High cholesterol 12/2011   History of blood transfusion 07/2011; 12/2011; 01/2012 X 2; 04/2012   History of gout    History of lower GI bleeding    Hypertension    Iron deficiency anemia    Jugular vein occlusion, right (HCC)    Mitral regurgitation    a. Moderate by echo, 02/2012   Myocardial infarction [facility])    NSVT (nonsustained ventricular tachycardia)    Ovarian cancer ([place]) 1992   PAF  (paroxysmal atrial fibrillation) ([place])    Pneumonia ~ 2009   PUD (peptic ulcer disease)    TIA (transient ischemic attack)     Assessment: 80yo female c/o intermittent CP over the past few weeks that was relieved each time with NTG, today had worse CP unrelieved w/ NTG, troponin mildly elevated and rising, to begin heparin.  Initial heparin level subtherapeutic on 650 units/hr, cards plan may consider LHC vs medical management  Goal of Therapy:  Heparin level 0.3-0.7 units/ml Monitor platelets by anticoagulation protocol: Yes   Plan:  Increase heparin gtt to 750 units/hr F/u 8 hour heparin level F/u cards plan and LOT  Bertis Ruddy, PharmD Clinical Pharmacist ED Pharmacist Phone # [phone] 07/07/2021 9:10 AM

## 2021-07-07 NOTE — Progress Notes (Signed)
Cardiology Consultation:   Patient ID: Shawna [name] MRN: [id number]; DOB: [birth year]  Admit date: 07/06/2021 Date of Consult: 07/07/2021  PCP:  [facility] [place] [facility] Cardiologist:  Carlyle Dolly, MD        Patient Profile:   Shawna [name] is a 80 y.o. female with a hx of  ESRD MWF, hypertension, chronic anemia, history of AVN of the colon, chronic diastolic heart failure, gastroesophageal reflux disease, hyperlipidemia and paroxysmal atrial fibrillation (not on chronic anticoagulation), and significant CAD history of CABG in 2013, along with DES to D1 in 10/2016 who is being seen 07/07/2021 for the evaluation of NSTEMI at the request of Dr, Hal Hope.   History of Present Illness:   [name] is a 80 y.o. female with a hx of  ESRD MWF, hypertension, chronic anemia, history of AVN of the colon, chronic diastolic heart failure, gastroesophageal reflux disease, hyperlipidemia and paroxysmal atrial fibrillation (not on chronic anticoagulation), and significant CAD history of CABG in 2013, along with DES to D1 in 10/2016 who is being seen 07/07/2021 for the evaluation of NSTEMI at the request of Dr, Hal Hope. Cath in 07/2018 showing patent grafts with occlusion of D1 at prior stent site and progression of PDA disease --> medical management recommended). Did not tolerate Ranexa and had hypotension with BB therapy in the past. 06/17/21 ER vsiit with chest pain. Trop 23-25. EKG SR, LBBB with PVCs. Now today presenting with similar complains. Chest pain is atypical. Says gets relief from NTG. Not exertional; not radiating. Troponins in the ED in 63s. EKG shows no ST elevation. Mild nausea. States chest pain has been getting more frequent, and is bothering her more often. Currently no chest pain. VS in the ED within normal limits. Heparin started in the ED for NSTEMI.    Past Medical History:  Diagnosis Date   Acute on chronic respiratory failure with hypoxia ([place])  10/10/2016   Anxiety    Arthritis    AVM (arteriovenous malformation) of colon    CAD (coronary artery disease)    a. s/p CABG in 2013 b. DES to D1 in 10/2016. c. cath in 07/2018 showing patent grafts with occlusion of D1 at prior stent site and progression of PDA disease --> medical management recommended   Carotid artery disease ([place])    a. 38-93% LICA, 01/3427    Chronic anemia    Chronic bronchitis (HCC)    Chronic diastolic CHF (congestive heart failure) ([place])    a. 02/2012 Echo EF 60-65%, nl wall motion, Gr 1 DD, mod MR   Colon cancer ([place]) 1992   Esophageal stricture    ESRD on hemodialysis ([place])    ESRD due to HTN, started dialysis 2011 and gets HD at [facility] with Dr Hinda Lenis on MWF schedule.  Access is LUA AVF as of Sept 2014.    GERD (gastroesophageal reflux disease)    High cholesterol 12/2011   History of blood transfusion 07/2011; 12/2011; 01/2012 X 2; 04/2012   History of gout    History of lower GI bleeding    Hypertension    Iron deficiency anemia    Jugular vein occlusion, right (HCC)    Mitral regurgitation    a. Moderate by echo, 02/2012   Myocardial infarction [facility])    NSVT (nonsustained ventricular tachycardia)    Ovarian cancer ([place]) 1992   PAF (paroxysmal atrial fibrillation) ([place])    Pneumonia ~ 2009   PUD (peptic ulcer disease)    TIA (  transient ischemic attack)     Past Surgical History:  Procedure Laterality Date   A/V SHUNTOGRAM Left 03/19/2019   Procedure: A/V SHUNTOGRAM;  Surgeon: Katha Cabal, MD;  Location: [place] CV LAB;  Service: Cardiovascular;  Laterality: Left;   ABDOMINAL HYSTERECTOMY  1992   APPENDECTOMY  06/1990   AV FISTULA PLACEMENT  07/2009   left upper arm   AV FISTULA PLACEMENT Right 09/06/2016   Procedure: RIGHT FOREARM ARTERIOVENOUS (AV) GRAFT;  Surgeon: Elam Dutch, MD;  Location: [place];  Service: Vascular;  Laterality: Right;   AV FISTULA PLACEMENT N/A 02/24/2017   Procedure: INSERTION OF ARTERIOVENOUS (AV)  GORE-TEX GRAFT ARM (BRACHIAL AXILLARY);  Surgeon: Katha Cabal, MD;  Location: [facility] ORS;  Service: Vascular;  Laterality: N/A;   [place] Right 09/06/2016   Procedure: REMOVAL OF Right Arm ARTERIOVENOUS GORETEX GRAFT and Vein Patch angioplasty of brachial artery;  Surgeon: Angelia Mould, MD;  Location: [place];  Service: Vascular;  Laterality: Right;   BIOPSY  09/26/2019   Procedure: BIOPSY;  Surgeon: Rogene Houston, MD;  [facility];  Service: Endoscopy;;   COLON RESECTION  1992   COLON SURGERY     COLONOSCOPY N/A 03/09/2019   Procedure: COLONOSCOPY;  Surgeon: Rogene Houston, MD;  [facility];  Service: Endoscopy;  Laterality: N/A;   COLONOSCOPY WITH PROPOFOL N/A 06/08/2021   Procedure: COLONOSCOPY WITH PROPOFOL;  Surgeon: Harvel Quale, MD;  [facility];  Service: Gastroenterology;  Laterality: N/A;  9:05 /Patient is on dialysis Mon Wed Fri   CORONARY ANGIOPLASTY WITH STENT PLACEMENT  12/15/11   "2"   CORONARY ANGIOPLASTY WITH STENT PLACEMENT  y/2013   "1; makes total of 3" (05/02/2012)   CORONARY ARTERY BYPASS GRAFT  06/13/2012   Procedure: CORONARY ARTERY BYPASS GRAFTING (CABG);  Surgeon: Grace Isaac, MD;  Location: [place];  Service: Open Heart Surgery;  Laterality: N/A;  cabg x four;  using left internal mammary artery, and left leg greater saphenous vein harvested endoscopically   CORONARY STENT INTERVENTION N/A 10/13/2016   Procedure: Coronary Stent Intervention;  Surgeon: Troy Sine, MD;  Location: [place] CV LAB;  Service: Cardiovascular;  Laterality: N/A;   DIALYSIS/PERMA CATHETER REMOVAL N/A 04/18/2017   Procedure: DIALYSIS/PERMA CATHETER REMOVAL;  Surgeon: Katha Cabal, MD;  Location: [place] CV LAB;  Service: Cardiovascular;  Laterality: N/A;   DILATION AND CURETTAGE OF UTERUS     ENTEROSCOPY N/A 06/08/2021   Procedure: PUSH ENTEROSCOPY;  Surgeon: Harvel Quale, MD;  [facility];  Service: Gastroenterology;  Laterality: N/A;   ESOPHAGOGASTRODUODENOSCOPY  01/20/2012   Procedure: ESOPHAGOGASTRODUODENOSCOPY (EGD);  Surgeon: Ladene Artist, MD,FACG;  Location: [facility] ENDOSCOPY;  Service: Endoscopy;  Laterality: N/A;   ESOPHAGOGASTRODUODENOSCOPY N/A 03/26/2013   Procedure: ESOPHAGOGASTRODUODENOSCOPY (EGD);  Surgeon: Irene Shipper, MD;  Location[facility] END[facility]COPY;  Service: Endoscopy;  Laterality: N/A;   ESOPHAGOGASTRODUODENOSCOPY N/A 04/30/2015   Procedure: ESOPHAGOGASTRODUODENOSCOPY (EGD);  Surgeon: Rogene Houston, MD;  [facility];  Service: Endoscopy;  Laterality: N/A;  1pm - moved to 10/20 @ 1:10   ESOPHAGOGASTRODUODENOSCOPY N/A 07/29/2016   Procedure: ESOPHAGOGASTRODUODENOSCOPY (EGD);  Surgeon: Manus Gunning, MD;  Location: [place];  Service: Gastroenterology;  Laterality: N/A;  enteroscopy   ESOPHAGOGASTRODUODENOSCOPY N/A 09/26/2019   Procedure: ESOPHAGOGASTRODUODENOSCOPY (EGD);  Surgeon: Rogene Houston, MD;  [facility];  Service: Endoscopy;  Laterality: N/A;  1250   ESOPHAGOGASTRODUODENOSCOPY (EGD)  WITH PROPOFOL N/A 02/05/2021   Procedure: ESOPHAGOGASTRODUODENOSCOPY (EGD) WITH PROPOFOL;  Surgeon: Eloise Harman, DO;  [facility];  Service: Endoscopy;  Laterality: N/A;   GIVENS CAPSULE STUDY N/A 03/07/2019   Procedure: GIVENS CAPSULE STUDY;  Surgeon: Rogene Houston, MD;  [facility];  Service: Endoscopy;  Laterality: N/A;  7:30   GIVENS CAPSULE STUDY N/A 04/22/2021   Procedure: GIVENS CAPSULE STUDY;  Surgeon: Rogene Houston, MD;  [facility];  Service: Endoscopy;  Laterality: N/A;  7:30   INSERTION OF DIALYSIS CATHETER N/A 10/05/2020   Procedure: ABORTED TUNNELED DIALYSIS CATHETER PLACEMENT RIGHT INTERNAL JUGULAR VEIN ;  Surgeon: Virl Cagey, MD;  [facility];  Service: General;  Laterality: N/A;   INTRAOPERATIVE TRANSESOPHAGEAL ECHOCARDIOGRAM  06/13/2012   Procedure: INTRAOPERATIVE  TRANSESOPHAGEAL ECHOCARDIOGRAM;  Surgeon: Grace Isaac, MD;  Location: [place];  Service: Open Heart Surgery;  Laterality: N/A;   IR DIALY SHUNT INTRO NEEDLE/INTRACATH INITIAL W/IMG LEFT Left 10/06/2020   IR FLUORO GUIDE CV LINE RIGHT  06/17/2020   IR GENERIC HISTORICAL  07/26/2016   IR FLUORO GUIDE CV LINE RIGHT 07/26/2016 Greggory Keen, MD [facility]   IR GENERIC HISTORICAL  07/26/2016   IR US GUIDE VASC ACCESS RIGHT 07/26/2016 Greggory Keen, MD [facility]   IR GENERIC HISTORICAL  08/02/2016   IR US GUIDE VASC ACCESS RIGHT 08/02/2016 Greggory Keen, MD [facility]   IR GENERIC HISTORICAL  08/02/2016   IR FLUORO GUIDE CV LINE RIGHT 08/02/2016 Greggory Keen, MD [facility]   IR RADIOLOGY PERIPHERAL GUIDED IV START  03/28/2017   IR REMOVAL TUN CV CATH W/O FL  08/11/2020   IR THROMBECTOMY AV FISTULA W/THROMBOLYSIS INC/SHUNT/IMG LEFT Left 06/17/2020   IR US GUIDE VASC ACCESS LEFT  06/17/2020   IR US GUIDE VASC ACCESS RIGHT  03/28/2017   IR US GUIDE VASC ACCESS RIGHT  06/17/2020   LEFT HEART CATH AND CORONARY ANGIOGRAPHY N/A 09/20/2016   Procedure: Left Heart Cath and Coronary Angiography;  Surgeon: [name], MD;  Location: [place] CV LAB;  Service: Cardiovascular;  Laterality: N/A;   LEFT HEART CATH AND CORS/GRAFTS ANGIOGRAPHY N/A 10/13/2016   Procedure: Left Heart Cath and Cors/Grafts Angiography;  Surgeon: Troy Sine, MD;  Location: [place] [facility] LAB;  Service: Cardiovascular;  Laterality: N/A;   LEFT HEART CATH AND CORS/GRAFTS ANGIOGRAPHY N/A 07/13/2018   Procedure: LEFT HEART CATH AND CORS/GRAFTS ANGIOGRAPHY;  Surgeon: Martinique, Peter M, MD;  Location: [place] [facility];  Service: Cardiovascular;  Laterality: N/A;   LEFT HEART CATHETERIZATION WITH CORONARY ANGIOGRAM N/A 12/15/2011   Procedure: LEFT HEART CATHETERIZATION WITH CORONARY ANGIOGRAM;  Surgeon: Burnell Blanks, MD;  Location: [facility] CATH LAB;  Service: Cardiovascular;  Laterality: N/A;   LEFT HEART CATHETERIZATION WITH  CORONARY ANGIOGRAM N/A 01/10/2012   Procedure: LEFT HEART CATHETERIZATION WITH CORONARY ANGIOGRAM;  Surgeon: Peter M Martinique, MD;  Location: [facility] CATH LAB;  Service: Cardiovascular;  Laterality: N/A;   LEFT HEART CATHETERIZATION WITH CORONARY ANGIOGRAM N/A 06/08/2012   Procedure: LEFT HEART CATHETERIZATION WITH CORONARY ANGIOGRAM;  Surgeon: Burnell Blanks, MD;  Location: [facility] CATH LAB;  Service: Cardiovascular;  Laterality: N/A;   LEFT HEART CATHETERIZATION WITH CORONARY/GRAFT ANGIOGRAM N/A 12/10/2013   Procedure: LEFT HEART CATHETERIZATION WITH Beatrix Fetters;  Surgeon: [name], MD;  Location: [facility] CATH LAB;  Service: Cardiovascular;  Laterality: N/A;   OVARY SURGERY     ovarian cancer   POLYPECTOMY  03/09/2019  Procedure: POLYPECTOMY;  Surgeon: Rogene Houston, MD;  [facility];  Service: Endoscopy;;  cecal    POLYPECTOMY N/A 09/26/2019   Procedure: DUODENAL POLYPECTOMY;  Surgeon: Rogene Houston, MD;  [facility];  Service: Endoscopy;  Laterality: N/A;   REVISION OF ARTERIOVENOUS GORETEX GRAFT N/A 02/24/2017   Procedure: REVISION OF ARTERIOVENOUS GORETEX GRAFT (RESECTION);  Surgeon: Katha Cabal, MD;  Location: [facility] ORS;  Service: Vascular;  Laterality: N/A;   REVISON OF ARTERIOVENOUS FISTULA Left 06/19/2020   Procedure: REVISION OF LEFT UPPER ARM AV GRAFT WITH INTERPOSITION JUMP GRAFT USING 6MM GORE LIMB;  Surgeon: Marty Heck, MD;  Location: [place];  Service: Vascular;  Laterality: Left;   SHUNTOGRAM N/A 10/15/2013   Procedure: Fistulogram;  Surgeon: Serafina Mitchell, MD;  Location: [facility] CATH LAB;  Service: Cardiovascular;  Laterality: N/A;   THROMBECTOMY / ARTERIOVENOUS GRAFT REVISION  2011   left upper arm   TUBAL LIGATION  1980's   UPPER EXTREMITY ANGIOGRAPHY Bilateral 12/06/2016   Procedure: Upper Extremity Angiography;  Surgeon: Katha Cabal, MD;  Location: [place] [facility] LAB;  Service: Cardiovascular;  Laterality: Bilateral;    UPPER EXTREMITY INTERVENTION Left 06/06/2017   Procedure: UPPER EXTREMITY INTERVENTION;  Surgeon: Katha Cabal, MD;  Location: [place] CV LAB;  Service: Cardiovascular;  Laterality: Left;       Inpatient Medications: Scheduled Meds:  ALPRAZolam  0.5 mg Oral QHS   amiodarone  200 mg Oral Daily   aspirin EC  81 mg Oral Daily   isosorbide mononitrate  90 mg Oral BID   levothyroxine  25 mcg Oral Daily   metoprolol tartrate  50 mg Oral BID   midodrine  5 mg Oral Q M,W,F   multivitamin  1 tablet Oral QHS   omeprazole  20 mg Oral Daily   rosuvastatin  10 mg Oral Daily   sevelamer carbonate  2,400 mg Oral TID WC   Continuous Infusions:  heparin 650 Units/hr (07/07/21 0135)   PRN Meds: hydrOXYzine, nitroGLYCERIN  Allergies:    Allergies  Allergen Reactions   Amlodipine Swelling   Aspirin Other (See Comments)    High Doses Mess up her stomach; "makes my bowels have blood in them". Takes 81 mg EC Aspirin    Nitrofurantoin Hives   Penicillins Other (See Comments)    SYNCOPE? , "makes me real weak when I take it; like I'll pass out"  Has patient had a PCN reaction causing immediate rash, facial/tongue/throat swelling, SOB or lightheadedness with hypotension: Yes Has patient had a PCN reaction causing severe rash involving mucus membranes or skin necrosis: no Has patient had a PCN reaction that required hospitalization no Has patient had a PCN reaction occurring within the last 10 years: no If all of the above    Bactrim [Sulfamethoxazole-Trimethoprim] Rash   Contrast Media [Iodinated Contrast Media] Itching   Iron Itching and Other (See Comments)    "they gave me iron in dialysis; had to give me Benadryl cause I had to have the iron" (05/02/2012)   Tylenol [Acetaminophen] Itching and Other (See Comments)    Makes her feet on fire per pt   Gabapentin Other (See Comments)    Unknown reaction   Iron Sucrose    No Known Allergies    Ranexa [Ranolazine] Other (See  Comments)    Myoclonus-hospitalized    Sucroferric Oxyhydroxide    Clopidogrel Rash   Dexilant [Dexlansoprazole] Other (See Comments)    Upset stomach  Hydralazine Itching    Has tolerated while inpatient Has tolerated while inpatient   Levaquin [Levofloxacin In D5w] Rash   Levofloxacin Rash   Morphine Itching   Morphine And Related Itching and Other (See Comments)    Itching in feet   Pantoprazole Rash   Plavix [Clopidogrel Bisulfate] Rash   Protonix [Pantoprazole Sodium] Rash   Venofer [Ferric Oxide] Itching and Other (See Comments)    Patient reports using Benadryl prior to doses as [place] History:   Social History   Socioeconomic History   Marital status: Married    Spouse name: [name]   Number of children: Not on file   Years of education: Not on file   Highest education level: Not on file  Occupational History   Not on file  Tobacco Use   Smoking status: Never   Smokeless tobacco: Never  Vaping Use   Vaping Use: Never used  Substance and Sexual Activity   Alcohol use: No    Alcohol/week: 0.0 standard drinks   Drug use: No   Sexual activity: Yes    Birth control/protection: Surgical  Other Topics Concern   Not on file  Social History Narrative   Lives in [place], [place] with husband.  Dialysis pt - mwf.   Social Determinants of Health   Financial Resource Strain: Not on file  Food Insecurity: Not on file  Transportation Needs: Not on file  Physical Activity: Not on file  Stress: Not on file  Social Connections: Not on file  Intimate Partner Violence: Not on file    Family History:   Family History  Problem Relation Age of Onset   Heart disease Mother        Heart Disease before age 53   Hyperlipidemia Mother    Hypertension Mother    Diabetes Mother    Heart attack Mother    Heart disease Father        Heart Disease before age 51   Hyperlipidemia Father    Hypertension Father    Diabetes Father    Diabetes Sister     Hypertension Sister    Diabetes Brother    Hyperlipidemia Brother    Heart attack Brother    Hypertension Sister    Heart attack Brother    Colon cancer Child 62   Other Other        noncontributory for early CAD   Esophageal cancer Neg Hx    Liver disease Neg Hx    Kidney disease Neg Hx    Colon polyps Neg Hx      ROS:  Please see the history of present illness.  All other ROS reviewed and negative.     Physical Exam/Data:   Vitals:   07/07/21 0230 07/07/21 0300 07/07/21 0330 07/07/21 0430  BP: 137/66 (!) 150/65 (!) 139/55 113/87  Pulse: (!) 127 (!) 138 (!) 132 69  Resp: 18 16 (!) 22 20  Temp:      TempSrc:      SpO2: 93% 100% 100% 100%  Weight:      Height:       No intake or output data in the 24 hours ending 07/07/21 0518 Last 3 Weights 07/07/2021 07/06/2021 06/22/2021  Weight (lbs) 123 lb 123 lb 123 lb 9.6 oz  Weight (kg) 55.792 kg 55.792 kg 56.065 kg     Body mass index is 23.24 kg/m.  General:  Well nourished, well developed, in no acute distress HEENT: normal Neck:  Elevated JVD Vascular: No carotid bruits; Distal pulses 2+ bilaterally Cardiac:  normal S1, S2; RRR; no murmur  Lungs: Poor airflow Abd: soft, nontender, no hepatomegaly  Ext: no edema Musculoskeletal:  No deformities, BUE and BLE strength normal and equal Skin: warm and dry  Neuro:  CNs 2-12 intact, no focal abnormalities noted Psych:  Normal affect   EKG:  The EKG was personally reviewed and demonstrates no ST elevation  Laboratory Data:  High Sensitivity Troponin:   Recent Labs  Lab 06/17/21 2255 07/06/21 1647 07/06/21 2027 07/07/21 0247 07/07/21 0359  TROPONINIHS 25* 43* 52* 52* 48*     Chemistry Recent Labs  Lab 07/06/21 1647 07/07/21 0247  NA 138 143  K 4.2 3.7  CL 103 104  CO2 21* 25  GLUCOSE 76 94  BUN 38* 43*  CREATININE 7.00* 7.45*  CALCIUM 9.3 9.2  MG 1.9  --   GFRNONAA 5* 5*  ANIONGAP 14 14    Recent Labs  Lab 07/06/21 1647  PROT 6.8  ALBUMIN 3.5   AST 10*  ALT 9  ALKPHOS 142*  BILITOT 0.5   Lipids No results for input(s): CHOL, TRIG, HDL, LABVLDL, LDLCALC, CHOLHDL in the last 168 hours.  Hematology Recent Labs  Lab 07/06/21 1647 07/07/21 0359  WBC 7.9 5.9  RBC 2.16* 2.08*  HGB 8.1* 8.0*  HCT 29.7* 25.9*  MCV 137.5* 124.5*  MCH 37.5* 38.5*  MCHC 27.3* 30.9  RDW 17.4* 17.2*  PLT 268 PLATELET CLUMPS OBSERVED ON SMEAR REVIEW   Thyroid No results for input(s): TSH, FREET4 in the last 168 hours.  BNPNo results for input(s): BNP, PROBNP in the last 168 hours.  DDimer No results for input(s): DDIMER in the last 168 hours.   Radiology/Studies:  DG Chest 2 View  Result Date: 07/06/2021 CLINICAL DATA:  Pt states she has been having intermittent chest pain, but today is worse. States pain is in back as well. Notes from triage: Pt reports a few weeks of intermittent chest pain, relieved with nitro. EXAM: CHEST - 2 VIEW COMPARISON:  06/17/2021 FINDINGS: Linear scarring or subsegmental atelectasis in the lung bases and right mid lung. Heart size upper limits normal. CABG markers and coronary stents. Aortic Atherosclerosis (ICD10-170.0). No effusion.  No pneumothorax. Sternotomy wires. IMPRESSION: No acute disease post CABG. Electronically Signed   By: Lucrezia Europe M.D.   On: 07/06/2021 18:30     Assessment and Plan:   # CAD s/p CABG # Atypical chest pain/Angina # ?NSTEMI  -History of CABG in 2013, along with DES to D1 in 10/2016 -Cath in 07/2018 showing patent grafts with occlusion of D1 at prior stent site and progression of PDA disease --> medical management recommended). -Seems like has been having more frequent chest pains with ED visits. Has not tolerated  Ranexa before. -Will discuss with primary cardiologist and interventional team to see if benefit in doing LHC today. Keep NPO at midnight -C/w Heparin- though suspicion for true NSTEMI low considering chest pain free now (previously atypical chest pain) and troponin mildly  high only and flat in the setting of HD -C/w current medical therapy including aspirin, statin, BB, Imdur.  -C/w to trend EKG and troponin   # HFpEF: Seems volume optimized. C/w HD on MWF # HTN: Well controlled with medications. C/w current therapy.    For questions or updates, please contact [place] Please consult [email or web address] for contact info under    Signed, Jaci Lazier, MD  07/07/2021 5:18 AM

## 2021-07-07 NOTE — Progress Notes (Signed)
Brief same-day note:  Patient is a 80 year old female with history of coronary disease status post CABG, stent placement, ESRD on dialysis on MWF who presented here with complaints of chest pain.  Chest pain had resolved with nitroglycerin.  Mild elevated troponins, flat trended.  Cardiology was consulted on admission.  Heparin drip was started on the suspicion of unstable angina.  Nephrology consulted for dialysis. Patient seen and examined the bedside this morning.  Hemodynamically stable during my evaluation.  She was comfortable without any chest pain.  She denied any new complaints to me.  Vitals were stable. Patient seen by Dr. Hal Hope this morning.  I agree with assessment and plan.  We will follow-up with cardiology for further recommendations/need of catheter or not

## 2021-07-07 NOTE — Procedures (Signed)
Patient was seen on dialysis and the procedure was supervised.  BFR 400  Via AVF BP is  125/55.   Patient appears to be tolerating treatment well-  no CP at present with HD where if she was having [place] dialysis would definitely tend to bring on.  Awaiting decision on whether needs heart cath or not.  We have provided routine HD today on schedule-  next would be due on Friday either here or at OP unit.  As is obs status have not done full formal consult   Louis Meckel 07/07/2021

## 2021-07-07 NOTE — Progress Notes (Signed)
Patient seen and examined this afternoon.  In brief, the patient is a 80 y.o. female with a hx of  ESRD MWF, hypertension, chronic anemia, history of AVN of the colon, chronic diastolic heart failure, gastroesophageal reflux disease, hyperlipidemia and paroxysmal atrial fibrillation (not on chronic anticoagulation), and significant CAD history of CABG in 2013, along with DES to D1 in 10/2016 who presented with recurrent chest pain found to have trop 50>52>48 for which Cardiology has been consulted.  Reviewed 2020 cath films with interventional Cardiology which showed patent bypasses and small PDA lesion. After review of films, would not go after PDA lesion as very small territory. Patient has chronic chest pain and is being managed medically. She is currently chest pain free. Given her history of poor response to stenting in the past as all prior stents occluded, will continue to try to manage medically. If fails, plan for coronary angiography at that time.   Of note, has numerous allergies and did not tolerate ranexa in the past. Will uptitrate imdur and see if she tolerates.  GEN: No acute distress.  Sitting in HD Neck: No JVD Cardiac: RRR, no murmurs, rubs, or gallops. Left AV fistula Respiratory: Clear GI: Soft, nontender, non-distended  MS: No edema; No deformity. Neuro:  Nonfocal  Psych: Normal affect    Plan: -Will continue to try medical management of chronic angina with increasing imdur to 120mg  BID -Did not tolerate ranexa in the past and has numerous allergies limiting medication options -If fails medical therapy, consider cath at that time. Notably, has had poor response to stents in the past as all were occluded on last cath in [place], [place]

## 2021-07-08 DIAGNOSIS — R079 Chest pain, unspecified: Secondary | ICD-10-CM | POA: Diagnosis not present

## 2021-07-08 LAB — CBC WITH DIFFERENTIAL/PLATELET
Abs Immature Granulocytes: 0.01 10*3/uL (ref 0.00–0.07)
Basophils Absolute: 0.1 10*3/uL (ref 0.0–0.1)
Basophils Relative: 1 %
Eosinophils Absolute: 0.3 10*3/uL (ref 0.0–0.5)
Eosinophils Relative: 6 %
HCT: 23.9 % — ABNORMAL LOW (ref 36.0–46.0)
Hemoglobin: 7.3 g/dL — ABNORMAL LOW (ref 12.0–15.0)
Immature Granulocytes: 0 %
Lymphocytes Relative: 14 %
Lymphs Abs: 0.8 10*3/uL (ref 0.7–4.0)
MCH: 37.4 pg — ABNORMAL HIGH (ref 26.0–34.0)
MCHC: 30.5 g/dL (ref 30.0–36.0)
MCV: 122.6 fL — ABNORMAL HIGH (ref 80.0–100.0)
Monocytes Absolute: 0.6 10*3/uL (ref 0.1–1.0)
Monocytes Relative: 11 %
Neutro Abs: 3.7 10*3/uL (ref 1.7–7.7)
Neutrophils Relative %: 68 %
Platelets: UNDETERMINED 10*3/uL (ref 150–400)
RBC: 1.95 MIL/uL — ABNORMAL LOW (ref 3.87–5.11)
RDW: 17 % — ABNORMAL HIGH (ref 11.5–15.5)
WBC: 5.4 10*3/uL (ref 4.0–10.5)
nRBC: 0 % (ref 0.0–0.2)

## 2021-07-08 LAB — BASIC METABOLIC PANEL
Anion gap: 10 (ref 5–15)
BUN: 28 mg/dL — ABNORMAL HIGH (ref 8–23)
CO2: 27 mmol/L (ref 22–32)
Calcium: 9.3 mg/dL (ref 8.9–10.3)
Chloride: 100 mmol/L (ref 98–111)
Creatinine, Ser: 5.53 mg/dL — ABNORMAL HIGH (ref 0.44–1.00)
GFR, Estimated: 7 mL/min — ABNORMAL LOW (ref >60)
Glucose, Bld: 85 mg/dL (ref 70–99)
Potassium: 3.9 mmol/L (ref 3.5–5.1)
Sodium: 137 mmol/L (ref 135–145)

## 2021-07-08 LAB — IRON AND TIBC
Iron: 75 ug/dL (ref 28–170)
Saturation Ratios: 30 % (ref 10.4–31.8)
TIBC: 253 ug/dL (ref 250–450)
UIBC: 178 ug/dL

## 2021-07-08 LAB — FERRITIN: Ferritin: 600 ng/mL — ABNORMAL HIGH (ref 11–307)

## 2021-07-08 MED ORDER — DARBEPOETIN ALFA 200 MCG/0.4ML IJ SOSY: 200.0000 ug | PREFILLED_SYRINGE | INTRAMUSCULAR | Status: DC

## 2021-07-08 MED ORDER — ISOSORBIDE MONONITRATE ER 60 MG PO TB24: 90.0000 mg | ORAL_TABLET | Freq: Two times a day (BID) | ORAL | Status: DC

## 2021-07-08 MED ORDER — SEVELAMER CARBONATE 800 MG PO TABS: 2400.0000 mg | ORAL_TABLET | Freq: Three times a day (TID) | ORAL | Status: DC

## 2021-07-08 MED ORDER — METOPROLOL TARTRATE 25 MG PO TABS: 12.5000 mg | ORAL_TABLET | Freq: Two times a day (BID) | ORAL | 0 refills | Status: DC

## 2021-07-08 MED ORDER — ISOSORBIDE MONONITRATE ER 30 MG PO TB24: 30.0000 mg | ORAL_TABLET | Freq: Every day | ORAL | 0 refills | Status: DC

## 2021-07-08 NOTE — Progress Notes (Signed)
Brief Nephrology Note  Patient states she feels fairly well today.  Chest pain has improved.  Tolerating dialysis yesterday without significant issues.  We will see the patient formally if she remains inpatient for much longer.  Currently observation status  Of note, hemoglobin 7.3 today.  Constant issue in the outpatient setting as well.  Has required transfusion in the past.  Iron studies demonstrated no iron deficiency.  Based on available data likely received the last ESA on 12/5.  Will order Aranesp 200 MCG to be given if she gets dialysis here tomorrow.  Could consider a transfusion of 1 unit but defer to primary team. She does not require inpatient stay from nephrology perspective and can resume outpatient dialysis tomorrow if deemed appropriate by primary team.

## 2021-07-08 NOTE — Progress Notes (Signed)
Mobility Specialist Progress Note:   07/08/21 1320  Mobility  Activity Ambulated in room  Level of Assistance Standby assist, set-up cues, supervision of patient - no hands on  Assistive Device None  Distance Ambulated (ft) 8 ft  Mobility Ambulated with assistance in room  Mobility Response Tolerated well  Mobility performed by Mobility specialist  Bed Position Chair  $Mobility charge 1 Mobility   PT received getting out of chair. Assisted pt with getting dressed then returned pt back to chair. Call bell in reach and all needs met.   [facility] Public librarian Phone [phone] Secondary Phone [phone]

## 2021-07-08 NOTE — Care Management Obs Status (Signed)
[place] NOTIFICATION   Patient Details  Name: Shawna [name] MRN: [id number] Date of Birth: [birth year]   Medicare Observation Status Notification Given:  Yes    Angelita Ingles, RN 07/08/2021, 8:58 AM

## 2021-07-08 NOTE — Discharge Summary (Addendum)
Physician Discharge Summary  Shawna [name] DGL:[id number] DOB: February 15, 1941 DOA: 07/06/2021  PCP: [facility]  Admit date: 07/06/2021 Discharge date: 07/08/2021  Admitted From: Home Disposition:  Home  Discharge Condition:Stable CODE STATUS:FULL Diet recommendation: Renal  Brief/Interim Summary:  Patient is a 80 year old female with history of coronary disease status post CABG, stent placement, ESRD on dialysis on MWF who presented here with complaints of chest pain.  Chest pain had resolved with nitroglycerin.  Mild elevated troponins, flat trended.  Cardiology was consulted on admission. Heparin drip was started on the suspicion of unstable angina.  Nephrology consulted for dialysis. Hospital course was stable.  Cardiology was following and did not plan for further work-up, recommend outpatient follow-up.  Her chest pain has resolved.  She is medically stable for discharge home today.  Following problems were addressed during her hospitalization:  Chest pain: with prior history of CAD status post CABG and PCI last cardiac cath in 2020 .patient was started on heparin concerning for possible unstable angina.  Troponins were elevated but flat trended.  Patient's chest pain resolved.  She just had echocardiogram earlier in September of this year so new echo not ordered.  Patient takes aspirin, statin, Imdur at home which we will continue.  Cardiology recommended outpatient follow-up.  We decreased the dose of Imdur to 30 mg daily because of soft bp ESRD on hemodialysis on Monday Wednesday Friday: Nephrology were following her here  chronic normocytic anemia: Likely from ESRD , no evidence of acute blood loss.  Iron studies showed normal iron.  We recommend to check hemoglobin a week.  She was recommended Aranesp by nephrology during dialysis.  She does not need blood transfusion at this moment. Chronic diastolic CHF: Fluid management per nephrology during dialysis. History of  paroxysmal atrial fibrillation: Not on anticoagulation secondary to GI bleed presently on amiodarone and metoprolol.  Presently in sinus rhythm.  We decreased the dose of metoprolol Hypothyroidism: on Synthroid.    Discharge Diagnoses:  Principal Problem:   Chest pain Active Problems:   ESRD on hemodialysis ([place])   Hx of CABG   Hypertension   Diabetes mellitus type 2 in nonobese [facility])   Atrial fibrillation [facility])    Discharge Instructions  Discharge Instructions     Diet - low sodium heart healthy   Complete by: As directed    Discharge instructions   Complete by: As directed    1)Please take your medications as instructed 2)Follow up with your PCP in 1 to 2 weeks.  Do a CBC test to check your hemoglobin during the follow-up 3)You will be called by cardiology for follow-up appointment   Increase activity slowly   Complete by: As directed       Allergies as of 07/08/2021       Reactions   Amlodipine Swelling   Aspirin Other (See Comments)   High Doses Mess up her stomach; "makes my bowels have blood in them". Takes 81 mg EC Aspirin    Nitrofurantoin Hives   Penicillins Other (See Comments)   SYNCOPE? , "makes me real weak when I take it; like I'll pass out" Has patient had a PCN reaction causing immediate rash, facial/tongue/throat swelling, SOB or lightheadedness with hypotension: Yes Has patient had a PCN reaction causing severe rash involving mucus membranes or skin necrosis: no Has patient had a PCN reaction that required hospitalization no Has patient had a PCN reaction occurring within the last 10 years: no If all of the above  Bactrim [sulfamethoxazole-trimethoprim] Rash   Contrast Media [iodinated Contrast Media] Itching   Iron Itching, Other (See Comments)   "they gave me iron in dialysis; had to give me Benadryl cause I had to have the iron" (05/02/2012)   Tylenol [acetaminophen] Itching, Other (See Comments)   Makes her feet on fire per pt   Gabapentin  Other (See Comments)   Unknown reaction   Iron Sucrose    No Known Allergies    Ranexa [ranolazine] Other (See Comments)   Myoclonus-hospitalized    Sucroferric Oxyhydroxide    Clopidogrel Rash   Dexilant [dexlansoprazole] Other (See Comments)   Upset stomach   Hydralazine Itching   Has tolerated while inpatient Has tolerated while inpatient   Levaquin [levofloxacin In D5w] Rash   Levofloxacin Rash   Morphine Itching   Morphine And Related Itching, Other (See Comments)   Itching in feet   Pantoprazole Rash   Plavix [clopidogrel Bisulfate] Rash   Protonix [pantoprazole Sodium] Rash   Venofer [ferric Oxide] Itching, Other (See Comments)   Patient reports using Benadryl prior to doses as [place]        Medication List     STOP taking these medications    diclofenac Sodium 1 % Gel Commonly known as: Voltaren   ferrous sulfate 325 (65 FE) MG tablet       TAKE these medications    ALPRAZolam 0.5 MG tablet Commonly known as: XANAX Take 0.5 mg by mouth at bedtime.   amiodarone 200 MG tablet Commonly known as: PACERONE Take 1 tablet (200 mg total) by mouth daily.   aspirin EC 81 MG tablet Take 81 mg by mouth daily.   fluticasone 50 MCG/ACT nasal spray Commonly known as: FLONASE Place 1 spray into both nostrils at bedtime as needed for allergies.   hydrOXYzine 100 MG capsule Commonly known as: VISTARIL Take 100 mg by mouth daily as needed for anxiety.   isosorbide mononitrate 30 MG 24 hr tablet Commonly known as: IMDUR Take 1 tablet (30 mg total) by mouth daily. What changed:  medication strength how much to take when to take this   levothyroxine 25 MCG tablet Commonly known as: SYNTHROID Take 1 tablet (25 mcg total) by mouth daily.   lidocaine-prilocaine cream Commonly known as: EMLA Apply 1 application topically every Monday, Wednesday, and Friday. Prior to dialysis   loperamide 2 MG tablet Commonly known as: IMODIUM A-D Take 2 tablets (4  mg total) by mouth daily as needed for diarrhea or loose stools.   metoprolol tartrate 25 MG tablet Commonly known as: LOPRESSOR Take 0.5 tablets (12.5 mg total) by mouth 2 (two) times daily. What changed:  medication strength how much to take   midodrine 5 MG tablet Commonly known as: PROAMATINE Take Monday, Wednesday, Friday morning BEFORE dialysis   multivitamin Tabs tablet Take 1 tablet by mouth daily.   Na Sulfate-K Sulfate-Mg Sulf 17.5-3.13-1.6 GM/177ML Soln Commonly known as: Suprep Bowel Prep Kit Take 1 kit by mouth as directed.   nitroGLYCERIN 0.4 MG SL tablet Commonly known as: NITROSTAT Place 1 tablet (0.4 mg total) under the tongue every 5 (five) minutes x 3 doses as needed for chest pain (if no relief after 2nd dose, proceed to the ED for an evaluation or call 911).   omeprazole 20 MG capsule Commonly known as: PRILOSEC Take 1 capsule (20 mg total) by mouth daily.   rosuvastatin 10 MG tablet Commonly known as: Crestor Take 1 tablet (10 mg total) by mouth  daily.   sevelamer carbonate 800 MG tablet Commonly known as: RENVELA Take 3 tablets (2,400 mg total) by mouth 3 (three) times daily with meals.        Follow-up Information     [facility]. Schedule an appointment as soon as possible for a visit in 1 week(s).   Contact information: [place] [address] [place], [place], PA-C Follow up.   Specialties: Physician Assistant, Cardiology Why: [facility] - follow-up on Thursday Jul 29, 2021 at 3:30 PM (Arrive by 3:15 PM) with Bernerd Pho who is one of the PAs that works with Dr. Harl Bowie. Contact information: [address] [place] [address] [phone]                Allergies  Allergen Reactions   Amlodipine Swelling   Aspirin Other (See Comments)    High Doses Mess up her stomach; "makes my bowels have blood in them". Takes 81 mg EC Aspirin    Nitrofurantoin Hives    Penicillins Other (See Comments)    SYNCOPE? , "makes me real weak when I take it; like I'll pass out"  Has patient had a PCN reaction causing immediate rash, facial/tongue/throat swelling, SOB or lightheadedness with hypotension: Yes Has patient had a PCN reaction causing severe rash involving mucus membranes or skin necrosis: no Has patient had a PCN reaction that required hospitalization no Has patient had a PCN reaction occurring within the last 10 years: no If all of the above    Bactrim [Sulfamethoxazole-Trimethoprim] Rash   Contrast Media [Iodinated Contrast Media] Itching   Iron Itching and Other (See Comments)    "they gave me iron in dialysis; had to give me Benadryl cause I had to have the iron" (05/02/2012)   Tylenol [Acetaminophen] Itching and Other (See Comments)    Makes her feet on fire per pt   Gabapentin Other (See Comments)    Unknown reaction   Iron Sucrose    No Known Allergies    Ranexa [Ranolazine] Other (See Comments)    Myoclonus-hospitalized    Sucroferric Oxyhydroxide    Clopidogrel Rash   Dexilant [Dexlansoprazole] Other (See Comments)    Upset stomach   Hydralazine Itching    Has tolerated while inpatient Has tolerated while inpatient   Levaquin [Levofloxacin In D5w] Rash   Levofloxacin Rash   Morphine Itching   Morphine And Related Itching and Other (See Comments)    Itching in feet   Pantoprazole Rash   Plavix [Clopidogrel Bisulfate] Rash   Protonix [Pantoprazole Sodium] Rash   Venofer [Ferric Oxide] Itching and Other (See Comments)    Patient reports using Benadryl prior to doses as [place]    Consultations: Cardiology   Procedures/Studies: DG Chest 2 View  Result Date: 07/06/2021 CLINICAL DATA:  Pt states she has been having intermittent chest pain, but today is worse. States pain is in back as well. Notes from triage: Pt reports a few weeks of intermittent chest pain, relieved with nitro. EXAM: CHEST - 2 VIEW COMPARISON:   06/17/2021 FINDINGS: Linear scarring or subsegmental atelectasis in the lung bases and right mid lung. Heart size upper limits normal. CABG markers and coronary stents. Aortic Atherosclerosis (ICD10-170.0). No effusion.  No pneumothorax. Sternotomy wires. IMPRESSION: No acute disease post CABG. Electronically Signed   By: Lucrezia Europe M.D.   On: 07/06/2021 18:30   DG Chest 2 View  Result  Date: 06/17/2021 CLINICAL DATA:  Chest pain EXAM: CHEST - 2 VIEW COMPARISON:  05/04/2021 FINDINGS: Minimal bibasilar atelectasis. Lungs are otherwise clear. No pneumothorax or pleural effusion. Coronary artery bypass grafting has been performed. Mild cardiomegaly is stable. No acute bone abnormality. IMPRESSION: No active cardiopulmonary disease.  Stable cardiomegaly. Electronically Signed   By: Fidela Salisbury M.D.   On: 06/17/2021 22:10      Subjective: Patient seen and examined the bedside this morning.  Hemodynamically stable.  Comfortable.  Denies chest pain.  Medically stable for discharge  Discharge Exam: Vitals:   07/08/21 0720 07/08/21 0950  BP: (!) 106/39 (!) 131/49  Pulse: 63 72  Resp: 17   Temp: 98.3 F (36.8 C)   SpO2: 98% 97%   Vitals:   07/08/21 0356 07/08/21 0356 07/08/21 0720 07/08/21 0950  BP: (!) 108/48  (!) 106/39 (!) 131/49  Pulse: 77 77 63 72  Resp: 18  17   Temp: 98.5 F (36.9 C) 98.5 F (36.9 C) 98.3 F (36.8 C)   TempSrc: Oral Oral Oral   SpO2: 100% 100% 98% 97%  Weight:      Height:        General: Pt is alert, awake, not in acute distress Cardiovascular: RRR, S1/S2 +, no rubs, no gallops Respiratory: CTA bilaterally, no wheezing, no rhonchi Abdominal: Soft, NT, ND, bowel sounds + Extremities: no edema, no cyanosis,AV fistula on left upper extremity    The results of significant diagnostics from this hospitalization (including imaging, microbiology, ancillary and laboratory) are listed below for reference.     Microbiology: Recent Results (from the past 240  hour(s))  Resp Panel by RT-PCR (Flu A&B, Covid) Nasopharyngeal Swab     Status: None   Collection Time: 07/06/21 11:39 PM   Specimen: Nasopharyngeal Swab; Nasopharyngeal(NP) swabs in vial transport medium  Result Value Ref Range Status   SARS Coronavirus 2 by RT PCR NEGATIVE NEGATIVE Final    Comment: (NOTE) SARS-CoV-2 target nucleic acids are NOT DETECTED.  The SARS-CoV-2 RNA is generally detectable in upper respiratory specimens during the acute phase of infection. The lowest concentration of SARS-CoV-2 viral copies this assay can detect is 138 copies/mL. A negative result does not preclude SARS-Cov-2 infection and should not be used as the sole basis for treatment or other patient management decisions. A negative result may occur with  improper specimen collection/handling, submission of specimen other than nasopharyngeal swab, presence of viral mutation(s) within the areas targeted by this assay, and inadequate number of viral copies(<138 copies/mL). A negative result must be combined with clinical observations, patient history, and epidemiological information. The expected result is Negative.  Fact Sheet for Patients:  EntrepreneurPulse.com.au  Fact Sheet for Healthcare Providers:  IncredibleEmployment.be  This test is no t yet approved or cleared by the [place] FDA and  has been authorized for detection and/or diagnosis of SARS-CoV-2 by FDA under an Emergency Use Authorization (EUA). This EUA will remain  in effect (meaning this test can be used) for the duration of the COVID-19 declaration under Section 564(b)(1) of the Act, 21 U.S.C.section 360bbb-3(b)(1), unless the authorization is terminated  or revoked sooner.       Influenza A by PCR NEGATIVE NEGATIVE Final   Influenza B by PCR NEGATIVE NEGATIVE Final    Comment: (NOTE) The Xpert Xpress SARS-CoV-2/FLU/RSV plus assay is intended as an aid in the diagnosis of influenza from  Nasopharyngeal swab specimens and should not be used as a sole basis for treatment. Nasal washings  and aspirates are unacceptable for Xpert Xpress SARS-CoV-2/FLU/RSV testing.  Fact Sheet for Patients: EntrepreneurPulse.com.au  Fact Sheet for Healthcare Providers: IncredibleEmployment.be  This test is not yet approved or cleared by the [place] FDA and has been authorized for detection and/or diagnosis of SARS-CoV-2 by FDA under an Emergency Use Authorization (EUA). This EUA will remain in effect (meaning this test can be used) for the duration of the COVID-19 declaration under Section 564(b)(1) of the Act, 21 U.S.C. section 360bbb-3(b)(1), unless the authorization is terminated or revoked.  Performed at [facility], [place] [address], [place], [place] [address]      Labs: BNP (last 3 results) Recent Labs    09/29/20 0112  BNP 269.4*   Basic Metabolic Panel: Recent Labs  Lab 07/06/21 1647 07/07/21 0247 07/08/21 0422  NA 138 143 137  K 4.2 3.7 3.9  CL 103 104 100  CO2 21* 25 27  GLUCOSE 76 94 85  BUN 38* 43* 28*  CREATININE 7.00* 7.45* 5.53*  CALCIUM 9.3 9.2 9.3  MG 1.9  --   --    Liver Function Tests: Recent Labs  Lab 07/06/21 1647  AST 10*  ALT 9  ALKPHOS 142*  BILITOT 0.5  PROT 6.8  ALBUMIN 3.5   Recent Labs  Lab 07/06/21 1647  LIPASE 43   No results for input(s): AMMONIA in the last 168 hours. CBC: Recent Labs  Lab 07/06/21 1647 07/07/21 0359 07/08/21 0422  WBC 7.9 5.9 5.4  NEUTROABS  --   --  3.7  HGB 8.1* 8.0* 7.3*  HCT 29.7* 25.9* 23.9*  MCV 137.5* 124.5* 122.6*  PLT 268 PLATELET CLUMPS OBSERVED ON SMEAR REVIEW PLATELET CLUMPS NOTED ON SMEAR, UNABLE TO ESTIMATE   Cardiac Enzymes: No results for input(s): CKTOTAL, CKMB, CKMBINDEX, TROPONINI in the last 168 hours. BNP: Invalid input(s): POCBNP CBG: No results for input(s): GLUCAP in the last 168 hours. D-Dimer No results for input(s):  DDIMER in the last 72 hours. Hgb A1c No results for input(s): HGBA1C in the last 72 hours. Lipid Profile No results for input(s): CHOL, HDL, LDLCALC, TRIG, CHOLHDL, LDLDIRECT in the last 72 hours. Thyroid function studies No results for input(s): TSH, T4TOTAL, T3FREE, THYROIDAB in the last 72 hours.  Invalid input(s): FREET3 Anemia work up Recent Labs    07/07/21 1425  FERRITIN 600*  TIBC 253  IRON 75   Urinalysis    Component Value Date/Time   COLORURINE YELLOW 12/16/2012 1919   APPEARANCEUR CLOUDY (A) 12/16/2012 1919   LABSPEC 1.009 12/16/2012 1919   PHURINE 7.5 12/16/2012 1919   GLUCOSEU NEGATIVE 12/16/2012 1919   HGBUR TRACE (A) 12/16/2012 1919   BILIRUBINUR NEGATIVE 12/16/2012 1919   KETONESUR NEGATIVE 12/16/2012 1919   PROTEINUR 100 (A) 12/16/2012 1919   UROBILINOGEN 0.2 12/16/2012 1919   NITRITE NEGATIVE 12/16/2012 1919   LEUKOCYTESUR SMALL (A) 12/16/2012 1919   Sepsis Labs Invalid input(s): PROCALCITONIN,  WBC,  LACTICIDVEN Microbiology Recent Results (from the past 240 hour(s))  Resp Panel by RT-PCR (Flu A&B, Covid) Nasopharyngeal Swab     Status: None   Collection Time: 07/06/21 11:39 PM   Specimen: Nasopharyngeal Swab; Nasopharyngeal(NP) swabs in vial transport medium  Result Value Ref Range Status   SARS Coronavirus 2 by RT PCR NEGATIVE NEGATIVE Final    Comment: (NOTE) SARS-CoV-2 target nucleic acids are NOT DETECTED.  The SARS-CoV-2 RNA is generally detectable in upper respiratory specimens during the acute phase of infection. The lowest concentration of SARS-CoV-2 viral copies  this assay can detect is 138 copies/mL. A negative result does not preclude SARS-Cov-2 infection and should not be used as the sole basis for treatment or other patient management decisions. A negative result may occur with  improper specimen collection/handling, submission of specimen other than nasopharyngeal swab, presence of viral mutation(s) within the areas targeted by  this assay, and inadequate number of viral copies(<138 copies/mL). A negative result must be combined with clinical observations, patient history, and epidemiological information. The expected result is Negative.  Fact Sheet for Patients:  EntrepreneurPulse.com.au  Fact Sheet for Healthcare Providers:  IncredibleEmployment.be  This test is no t yet approved or cleared by the [place] FDA and  has been authorized for detection and/or diagnosis of SARS-CoV-2 by FDA under an Emergency Use Authorization (EUA). This EUA will remain  in effect (meaning this test can be used) for the duration of the COVID-19 declaration under Section 564(b)(1) of the Act, 21 U.S.C.section 360bbb-3(b)(1), unless the authorization is terminated  or revoked sooner.       Influenza A by PCR NEGATIVE NEGATIVE Final   Influenza B by PCR NEGATIVE NEGATIVE Final    Comment: (NOTE) The Xpert Xpress SARS-CoV-2/FLU/RSV plus assay is intended as an aid in the diagnosis of influenza from Nasopharyngeal swab specimens and should not be used as a sole basis for treatment. Nasal washings and aspirates are unacceptable for Xpert Xpress SARS-CoV-2/FLU/RSV testing.  Fact Sheet for Patients: EntrepreneurPulse.com.au  Fact Sheet for Healthcare Providers: IncredibleEmployment.be  This test is not yet approved or cleared by the [place] FDA and has been authorized for detection and/or diagnosis of SARS-CoV-2 by FDA under an Emergency Use Authorization (EUA). This EUA will remain in effect (meaning this test can be used) for the duration of the COVID-19 declaration under Section 564(b)(1) of the Act, 21 U.S.C. section 360bbb-3(b)(1), unless the authorization is terminated or revoked.  Performed at [facility], [place] [address] [place], [place] [address]     Please note: You were cared for by a hospitalist during your hospital  stay. Once you are discharged, your primary care physician will handle any further medical issues. Please note that NO REFILLS for any discharge medications will be authorized once you are discharged, as it is imperative that you return to your primary care physician (or establish a relationship with a primary care physician if you do not have one) for your post hospital discharge needs so that they can reassess your need for medications and monitor your lab values.    Time coordinating discharge: 40 minutes  SIGNED:   Shelly Coss, MD  [facility] 07/08/2021, 2:17 PM Pager [phone]  If 7PM-7AM, please contact night-coverage [email or web address] Password [email or web address]

## 2021-07-13 ENCOUNTER — Telehealth: Payer: Self-pay | Admitting: Cardiology

## 2021-07-13 NOTE — Telephone Encounter (Signed)
Detailed voice message left that she already has a post hospital visit scheduled for 07/29/2021 with Bernerd Pho in [place] office.

## 2021-07-13 NOTE — Telephone Encounter (Signed)
Pt's husband came into the office requesting for pt to have a post hosp f/u apt- she was admitted last week at [facility] for Chest pain.   Please call [phone]

## 2021-07-13 NOTE — Patient Instructions (Signed)
Shawna [name]  07/13/2021     @PREFPERIOPPHARMACY @   Your procedure is scheduled on  07/20/2021.   Report to [facility] at  [phone] A.M.   Call this number if you have problems the morning of surgery:  [phone]   Remember:  Follow the diet and prep instructions given to you by the office.    Take these medicines the morning of surgery with A SIP OF WATER            pacerone, hydroxyzine, imdur, levothyroxine, metoprolol, prilosec.     Do not wear jewelry, make-up or nail polish.  Do not wear lotions, powders, or perfumes, or deodorant.  Do not shave 48 hours prior to surgery.  Men may shave face and neck.  Do not bring valuables to the hospital.  [facility] is not responsible for any belongings or valuables.  Contacts, dentures or bridgework may not be worn into surgery.  Leave your suitcase in the car.  After surgery it may be brought to your room.  For patients admitted to the hospital, discharge time will be determined by your treatment team.  Patients discharged the day of surgery will not be allowed to drive home and must have someone with them for 24 hours.    Special instructions:   DO NOT smoke tobacco or vape for 24 hours before your procedure.  Please read over the following fact sheets that you were given. Anesthesia Post-op Instructions and Care and Recovery After Surgery      Upper Endoscopy, Adult, Care After This sheet gives you information about how to care for yourself after your procedure. Your health care provider may also give you more specific instructions. If you have problems or questions, contact your health care provider. What can I expect after the procedure? After the procedure, it is common to have: A sore throat. Mild stomach pain or discomfort. Bloating. Nausea. Follow these instructions at home:  Follow instructions from your health care provider about what to eat or drink after your procedure. Return to your normal  activities as told by your health care provider. Ask your health care provider what activities are safe for you. Take over-the-counter and prescription medicines only as told by your health care provider. If you were given a sedative during the procedure, it can affect you for several hours. Do not drive or operate machinery until your health care provider says that it is safe. Keep all follow-up visits as told by your health care provider. This is important. Contact a health care provider if you have: A sore throat that lasts longer than one day. Trouble swallowing. Get help right away if: You vomit blood or your vomit looks like coffee grounds. You have: A fever. Bloody, black, or tarry stools. A severe sore throat or you cannot swallow. Difficulty breathing. Severe pain in your chest or abdomen. Summary After the procedure, it is common to have a sore throat, mild stomach discomfort, bloating, and nausea. If you were given a sedative during the procedure, it can affect you for several hours. Do not drive or operate machinery until your health care provider says that it is safe. Follow instructions from your health care provider about what to eat or drink after your procedure. Return to your normal activities as told by your health care provider. This information is not intended to replace advice given to you by your health care provider. Make sure you discuss any questions  you have with your health care provider. Document Revised: 05/03/2019 Document Reviewed: 11/27/2017 Elsevier Patient Education  2022 [place]. Colonoscopy, Adult, Care After This sheet gives you information about how to care for yourself after your procedure. Your health care provider may also give you more specific instructions. If you have problems or questions, contact your health care provider. What can I expect after the procedure? After the procedure, it is common to have: A small amount of blood in your  stool for 24 hours after the procedure. Some gas. Mild cramping or bloating of your abdomen. Follow these instructions at home: Eating and drinking  Drink enough fluid to keep your urine pale yellow. Follow instructions from your health care provider about eating or drinking restrictions. Resume your normal diet as instructed by your health care provider. Avoid heavy or fried foods that are hard to digest. Activity Rest as told by your health care provider. Avoid sitting for a long time without moving. Get up to take short walks every 1-2 hours. This is important to improve blood flow and breathing. Ask for help if you feel weak or unsteady. Return to your normal activities as told by your health care provider. Ask your health care provider what activities are safe for you. Managing cramping and bloating  Try walking around when you have cramps or feel bloated. Apply heat to your abdomen as told by your health care provider. Use the heat source that your health care provider recommends, such as a moist heat pack or a heating pad. Place a towel between your skin and the heat source. Leave the heat on for 20-30 minutes. Remove the heat if your skin turns bright red. This is especially important if you are unable to feel pain, heat, or cold. You may have a greater risk of getting burned. General instructions If you were given a sedative during the procedure, it can affect you for several hours. Do not drive or operate machinery until your health care provider says that it is safe. For the first 24 hours after the procedure: Do not sign important documents. Do not drink alcohol. Do your regular daily activities at a slower pace than normal. Eat soft foods that are easy to digest. Take over-the-counter and prescription medicines only as told by your health care provider. Keep all follow-up visits as told by your health care provider. This is important. Contact a health care provider if: You  have blood in your stool 2-3 days after the procedure. Get help right away if you have: More than a small spotting of blood in your stool. Large blood clots in your stool. Swelling of your abdomen. Nausea or vomiting. A fever. Increasing pain in your abdomen that is not relieved with medicine. Summary After the procedure, it is common to have a small amount of blood in your stool. You may also have mild cramping and bloating of your abdomen. If you were given a sedative during the procedure, it can affect you for several hours. Do not drive or operate machinery until your health care provider says that it is safe. Get help right away if you have a lot of blood in your stool, nausea or vomiting, a fever, or increased pain in your abdomen. This information is not intended to replace advice given to you by your health care provider. Make sure you discuss any questions you have with your health care provider. Document Revised: 05/03/2019 Document Reviewed: 01/21/2019 Elsevier Patient Education  [place] [facility]  Care, Care After This sheet gives you information about how to care for yourself after your procedure. Your health care provider may also give you more specific instructions. If you have problems or questions, contact your health care provider. What can I expect after the procedure? After the procedure, it is common to have: Tiredness. Forgetfulness about what happened after the procedure. Impaired judgment for important decisions. Nausea or vomiting. Some difficulty with balance. Follow these instructions at home: For the time period you were told by your health care provider:   Rest as needed. Do not participate in activities where you could fall or become injured. Do not drive or use machinery. Do not drink alcohol. Do not take sleeping pills or medicines that cause drowsiness. Do not make important decisions or sign legal documents. Do not take care of  children on your own. Eating and drinking Follow the diet that is recommended by your health care provider. Drink enough fluid to keep your urine pale yellow. If you vomit: Drink water, juice, or soup when you can drink without vomiting. Make sure you have little or no nausea before eating solid foods. General instructions Have a responsible adult stay with you for the time you are told. It is important to have someone help care for you until you are awake and alert. Take over-the-counter and prescription medicines only as told by your health care provider. If you have sleep apnea, surgery and certain medicines can increase your risk for breathing problems. Follow instructions from your health care provider about wearing your sleep device: Anytime you are sleeping, including during daytime naps. While taking prescription pain medicines, sleeping medicines, or medicines that make you drowsy. Avoid smoking. Keep all follow-up visits as told by your health care provider. This is important. Contact a health care provider if: You keep feeling nauseous or you keep vomiting. You feel light-headed. You are still sleepy or having trouble with balance after 24 hours. You develop a rash. You have a fever. You have redness or swelling around the IV site. Get help right away if: You have trouble breathing. You have new-onset confusion at home. Summary For several hours after your procedure, you may feel tired. You may also be forgetful and have poor judgment. Have a responsible adult stay with you for the time you are told. It is important to have someone help care for you until you are awake and alert. Rest as told. Do not drive or operate machinery. Do not drink alcohol or take sleeping pills. Get help right away if you have trouble breathing, or if you suddenly become confused. This information is not intended to replace advice given to you by your health care provider. Make sure you discuss any  questions you have with your health care provider. Document Revised: 03/12/2020 Document Reviewed: 05/30/2019 Elsevier Patient Education  2022 [employer].

## 2021-07-15 ENCOUNTER — Other Ambulatory Visit: Payer: Self-pay

## 2021-07-15 ENCOUNTER — Encounter (HOSPITAL_COMMUNITY): Payer: Self-pay | Admitting: Emergency Medicine

## 2021-07-15 ENCOUNTER — Emergency Department (HOSPITAL_COMMUNITY): Payer: Medicare HMO

## 2021-07-15 ENCOUNTER — Inpatient Hospital Stay (HOSPITAL_COMMUNITY)
Admission: EM | Admit: 2021-07-15 | Discharge: 2021-07-24 | DRG: 280 | Disposition: A | Discharge status: Home / Self Care | Payer: Medicare HMO | Attending: Internal Medicine | Admitting: Internal Medicine

## 2021-07-15 ENCOUNTER — Encounter (HOSPITAL_COMMUNITY)
Admission: RE | Admit: 2021-07-15 | Discharge: 2021-07-15 | Disposition: A | Discharge status: Home / Self Care | Payer: Medicare HMO | Source: Ambulatory Visit | Attending: Gastroenterology | Admitting: Gastroenterology

## 2021-07-15 DIAGNOSIS — K5521 Angiodysplasia of colon with hemorrhage: Secondary | ICD-10-CM | POA: Diagnosis present

## 2021-07-15 DIAGNOSIS — I252 Old myocardial infarction: Secondary | ICD-10-CM

## 2021-07-15 DIAGNOSIS — Z992 Dependence on renal dialysis: Secondary | ICD-10-CM

## 2021-07-15 DIAGNOSIS — I2510 Atherosclerotic heart disease of native coronary artery without angina pectoris: Secondary | ICD-10-CM | POA: Diagnosis present

## 2021-07-15 DIAGNOSIS — Z880 Allergy status to penicillin: Secondary | ICD-10-CM

## 2021-07-15 DIAGNOSIS — K219 Gastro-esophageal reflux disease without esophagitis: Secondary | ICD-10-CM | POA: Diagnosis present

## 2021-07-15 DIAGNOSIS — Z83438 Family history of other disorder of lipoprotein metabolism and other lipidemia: Secondary | ICD-10-CM

## 2021-07-15 DIAGNOSIS — E785 Hyperlipidemia, unspecified: Secondary | ICD-10-CM | POA: Diagnosis present

## 2021-07-15 DIAGNOSIS — Z85038 Personal history of other malignant neoplasm of large intestine: Secondary | ICD-10-CM

## 2021-07-15 DIAGNOSIS — Z91041 Radiographic dye allergy status: Secondary | ICD-10-CM

## 2021-07-15 DIAGNOSIS — N2581 Secondary hyperparathyroidism of renal origin: Secondary | ICD-10-CM | POA: Diagnosis present

## 2021-07-15 DIAGNOSIS — Z951 Presence of aortocoronary bypass graft: Secondary | ICD-10-CM

## 2021-07-15 DIAGNOSIS — Z8673 Personal history of transient ischemic attack (TIA), and cerebral infarction without residual deficits: Secondary | ICD-10-CM

## 2021-07-15 DIAGNOSIS — I480 Paroxysmal atrial fibrillation: Secondary | ICD-10-CM

## 2021-07-15 DIAGNOSIS — Z8543 Personal history of malignant neoplasm of ovary: Secondary | ICD-10-CM

## 2021-07-15 DIAGNOSIS — Z955 Presence of coronary angioplasty implant and graft: Secondary | ICD-10-CM

## 2021-07-15 DIAGNOSIS — Z886 Allergy status to analgesic agent status: Secondary | ICD-10-CM

## 2021-07-15 DIAGNOSIS — D696 Thrombocytopenia, unspecified: Secondary | ICD-10-CM | POA: Diagnosis present

## 2021-07-15 DIAGNOSIS — J9601 Acute respiratory failure with hypoxia: Secondary | ICD-10-CM | POA: Diagnosis present

## 2021-07-15 DIAGNOSIS — I132 Hypertensive heart and chronic kidney disease with heart failure and with stage 5 chronic kidney disease, or end stage renal disease: Secondary | ICD-10-CM | POA: Diagnosis present

## 2021-07-15 DIAGNOSIS — I214 Non-ST elevation (NSTEMI) myocardial infarction: Secondary | ICD-10-CM | POA: Diagnosis not present

## 2021-07-15 DIAGNOSIS — N186 End stage renal disease: Secondary | ICD-10-CM | POA: Diagnosis present

## 2021-07-15 DIAGNOSIS — I5043 Acute on chronic combined systolic (congestive) and diastolic (congestive) heart failure: Secondary | ICD-10-CM | POA: Diagnosis present

## 2021-07-15 DIAGNOSIS — Z8249 Family history of ischemic heart disease and other diseases of the circulatory system: Secondary | ICD-10-CM

## 2021-07-15 DIAGNOSIS — K5520 Angiodysplasia of colon without hemorrhage: Secondary | ICD-10-CM | POA: Diagnosis present

## 2021-07-15 DIAGNOSIS — D62 Acute posthemorrhagic anemia: Secondary | ICD-10-CM | POA: Diagnosis not present

## 2021-07-15 DIAGNOSIS — I200 Unstable angina: Secondary | ICD-10-CM | POA: Diagnosis present

## 2021-07-15 DIAGNOSIS — I255 Ischemic cardiomyopathy: Secondary | ICD-10-CM

## 2021-07-15 DIAGNOSIS — E7800 Pure hypercholesterolemia, unspecified: Secondary | ICD-10-CM | POA: Diagnosis present

## 2021-07-15 DIAGNOSIS — I5041 Acute combined systolic (congestive) and diastolic (congestive) heart failure: Secondary | ICD-10-CM

## 2021-07-15 DIAGNOSIS — Z20822 Contact with and (suspected) exposure to covid-19: Secondary | ICD-10-CM | POA: Diagnosis present

## 2021-07-15 DIAGNOSIS — I10 Essential (primary) hypertension: Secondary | ICD-10-CM | POA: Diagnosis present

## 2021-07-15 DIAGNOSIS — R079 Chest pain, unspecified: Secondary | ICD-10-CM | POA: Diagnosis present

## 2021-07-15 DIAGNOSIS — I471 Supraventricular tachycardia: Secondary | ICD-10-CM | POA: Diagnosis present

## 2021-07-15 DIAGNOSIS — J069 Acute upper respiratory infection, unspecified: Secondary | ICD-10-CM | POA: Diagnosis present

## 2021-07-15 DIAGNOSIS — I25110 Atherosclerotic heart disease of native coronary artery with unstable angina pectoris: Secondary | ICD-10-CM | POA: Diagnosis present

## 2021-07-15 DIAGNOSIS — I209 Angina pectoris, unspecified: Secondary | ICD-10-CM | POA: Diagnosis present

## 2021-07-15 DIAGNOSIS — Z7982 Long term (current) use of aspirin: Secondary | ICD-10-CM

## 2021-07-15 DIAGNOSIS — M898X9 Other specified disorders of bone, unspecified site: Secondary | ICD-10-CM | POA: Diagnosis present

## 2021-07-15 DIAGNOSIS — G8929 Other chronic pain: Secondary | ICD-10-CM | POA: Diagnosis present

## 2021-07-15 DIAGNOSIS — E871 Hypo-osmolality and hyponatremia: Secondary | ICD-10-CM | POA: Diagnosis not present

## 2021-07-15 DIAGNOSIS — Z79890 Hormone replacement therapy (postmenopausal): Secondary | ICD-10-CM

## 2021-07-15 DIAGNOSIS — Z881 Allergy status to other antibiotic agents status: Secondary | ICD-10-CM

## 2021-07-15 DIAGNOSIS — I779 Disorder of arteries and arterioles, unspecified: Secondary | ICD-10-CM | POA: Diagnosis present

## 2021-07-15 DIAGNOSIS — E039 Hypothyroidism, unspecified: Secondary | ICD-10-CM | POA: Diagnosis present

## 2021-07-15 DIAGNOSIS — J811 Chronic pulmonary edema: Secondary | ICD-10-CM | POA: Diagnosis not present

## 2021-07-15 DIAGNOSIS — J9600 Acute respiratory failure, unspecified whether with hypoxia or hypercapnia: Secondary | ICD-10-CM

## 2021-07-15 DIAGNOSIS — M109 Gout, unspecified: Secondary | ICD-10-CM | POA: Diagnosis present

## 2021-07-15 DIAGNOSIS — Z885 Allergy status to narcotic agent status: Secondary | ICD-10-CM

## 2021-07-15 DIAGNOSIS — Z79899 Other long term (current) drug therapy: Secondary | ICD-10-CM

## 2021-07-15 DIAGNOSIS — D631 Anemia in chronic kidney disease: Secondary | ICD-10-CM | POA: Diagnosis present

## 2021-07-15 DIAGNOSIS — Z888 Allergy status to other drugs, medicaments and biological substances status: Secondary | ICD-10-CM

## 2021-07-15 DIAGNOSIS — E782 Mixed hyperlipidemia: Secondary | ICD-10-CM | POA: Diagnosis present

## 2021-07-15 DIAGNOSIS — I4892 Unspecified atrial flutter: Secondary | ICD-10-CM | POA: Diagnosis present

## 2021-07-15 IMAGING — DX DG CHEST 2V
2 series · 2 of 2 positions shown · non-contrast
Comparison: [DATE]

CLINICAL DATA: Central chest pain.

EXAM:
CHEST - 2 VIEW

[chest lat]
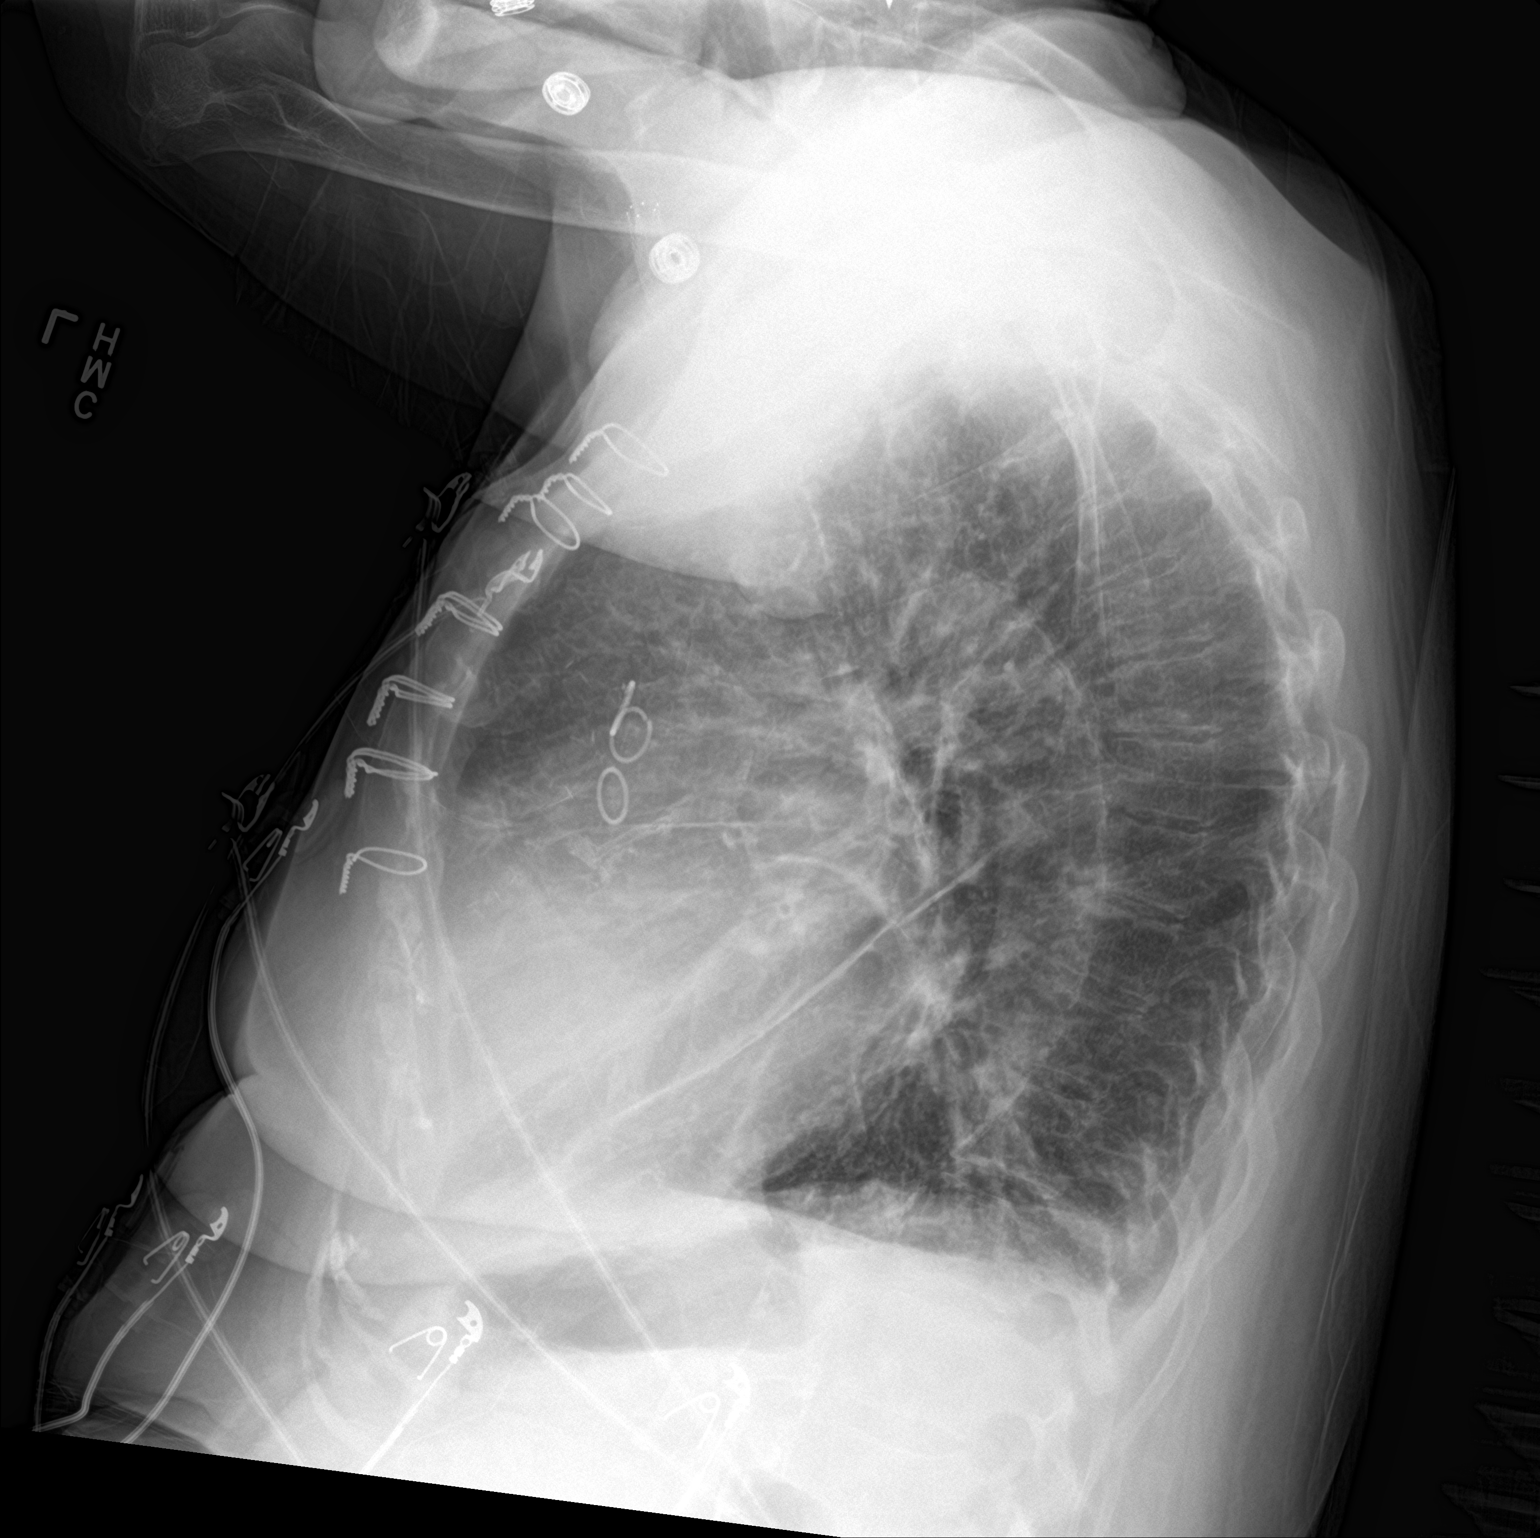

[chest ap]
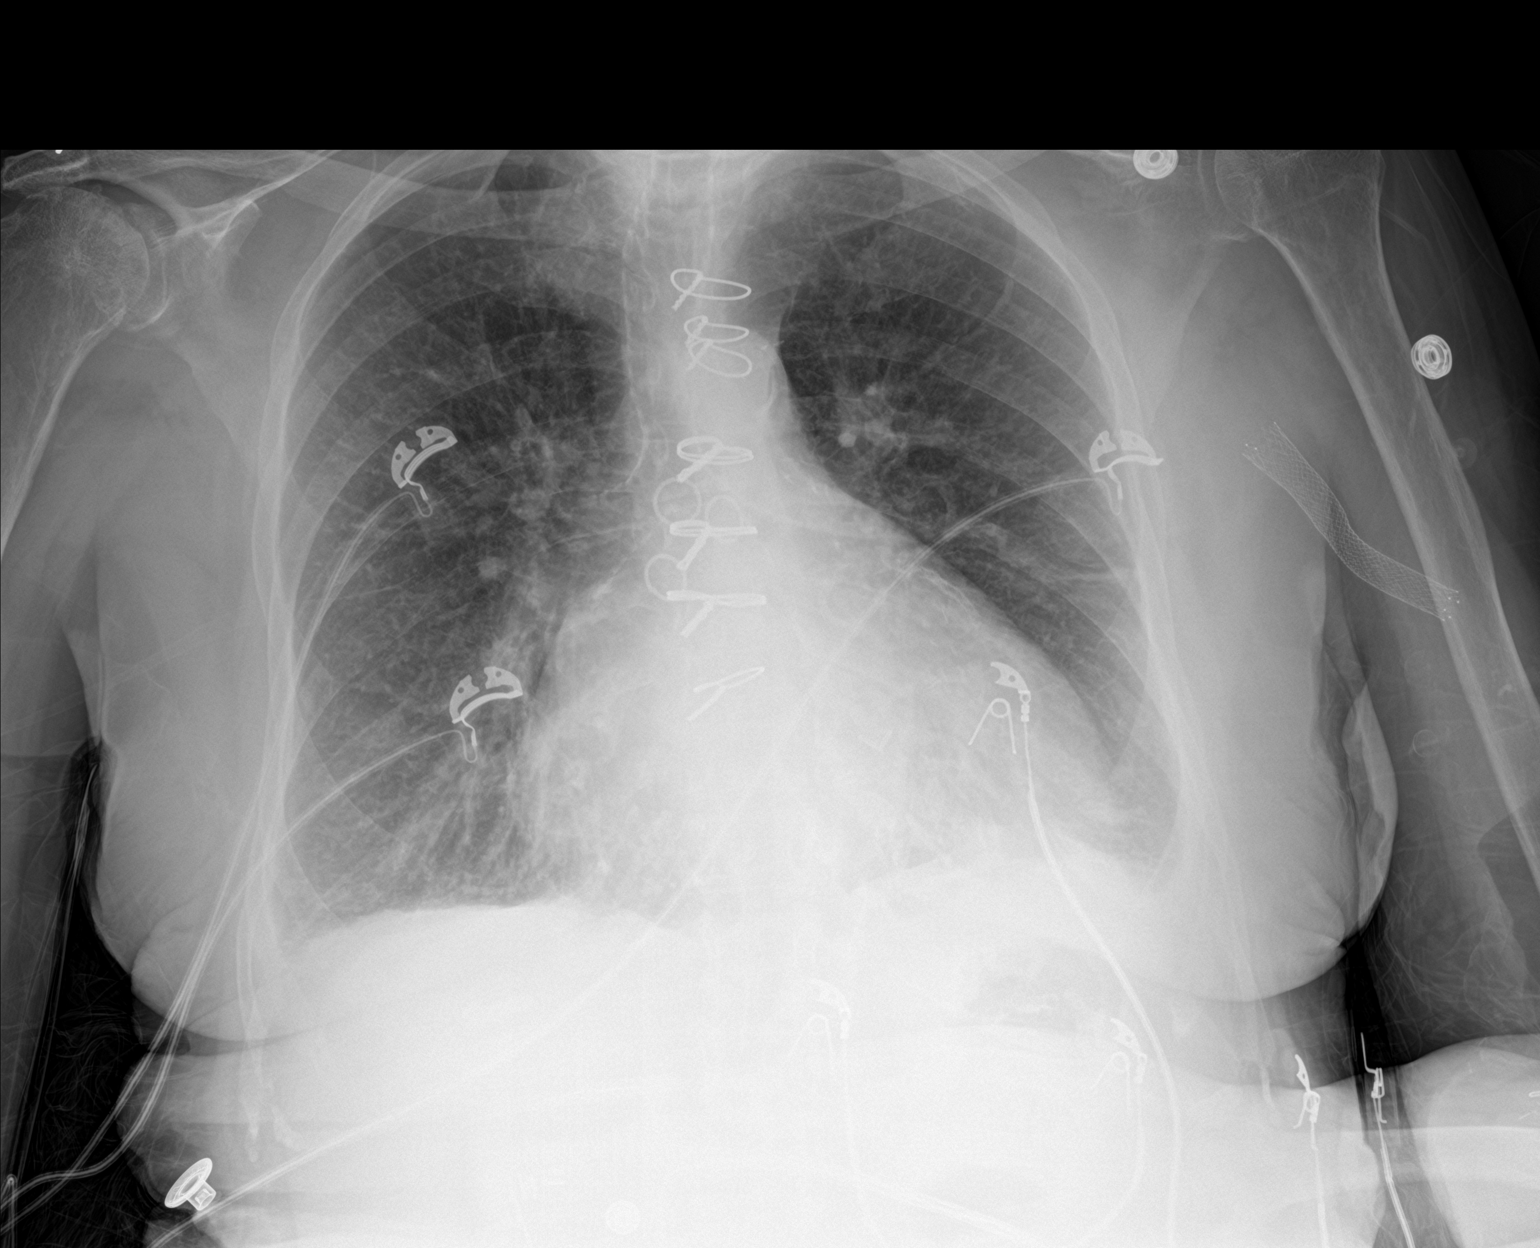

[2 of 2 positions shown; findings below may reference images not displayed]

FINDINGS: Multiple sternal wires and vascular clips are seen. Mild, stable,
diffusely increased lung markings are noted. Mild atelectasis is
seen within the bilateral lung bases. Small bilateral pleural
effusions are present. No pneumothorax is identified. There is mild
to moderate severity enlargement of the cardiac silhouette. This is
increased in severity when compared to the prior study. A coronary
artery stent is noted. A radiopaque vascular stent is seen within
the soft tissues of the proximal left upper extremity. Multilevel
degenerative changes seen throughout the thoracic spine.
IMPRESSION: 1. Evidence of prior median sternotomy/CABG.
2. Mild to moderate severity enlargement of the cardiac silhouette
which represents a new finding when compared to the exam. Interval
development of a pericardial effusion cannot be excluded.
Correlation with chest CT is recommended.
3. Mild bibasilar atelectasis.
4. Small bilateral pleural effusions.

## 2021-07-15 NOTE — ED Provider Notes (Signed)
[facility] EMERGENCY DEPARTMENT Provider Note   CSN: [id number] Arrival date & time: 07/15/21  2247     History  Chief Complaint  Patient presents with   Chest Pain    Shawna [name] is a 81 y.o. female.  Patient presents for evaluation of chest pain.  Patient reports that pain began this afternoon and has slowly worsened over time.  No associated nausea, diaphoresis, shortness of breath.  Pain radiates to the left arm.  She reports that this feels similar to when she had a heart attack years ago.  She has had stents as well as bypass surgery.  Patient reports that recently she has required frequent nitroglycerin for similar chest pain over recent weeks which is unusual for her.  Until tonight, the nitroglycerin would make the pain go away.  She took 2 nitroglycerin tonight not resolution.  She took 2 aspirin prior to coming to the ER as well.      Home Medications Prior to Admission medications   Medication Sig Start Date End Date Taking? Authorizing Provider  ALPRAZolam Duanne Moron) 0.5 MG tablet Take 0.5 mg by mouth at bedtime. 03/20/21   [provider]  amiodarone (PACERONE) 200 MG tablet Take 1 tablet (200 mg total) by mouth daily. 05/13/21   Strader, Fransisco Hertz, PA-C  aspirin EC 81 MG tablet Take 81 mg by mouth daily.  09/27/19   Rehman, Mechele Dawley, MD  fluticasone (FLONASE) 50 MCG/ACT nasal spray Place 1 spray into both nostrils at bedtime as needed for allergies.     [provider]  hydrOXYzine (VISTARIL) 100 MG capsule Take 100 mg by mouth daily as needed for anxiety. 11/23/20   [provider]  isosorbide mononitrate (IMDUR) 30 MG 24 hr tablet Take 1 tablet (30 mg total) by mouth daily. 07/08/21   Shelly Coss, MD  levothyroxine (SYNTHROID) 25 MCG tablet Take 1 tablet (25 mcg total) by mouth daily. 02/02/21   Brita Romp, NP  lidocaine-prilocaine (EMLA) cream Apply 1 application topically every Monday, Wednesday, and Friday. Prior to dialysis     [provider]  loperamide (IMODIUM A-D) 2 MG tablet Take 2 tablets (4 mg total) by mouth daily as needed for diarrhea or loose stools. 12/17/20   Thurnell Lose, MD  metoprolol tartrate (LOPRESSOR) 25 MG tablet Take 0.5 tablets (12.5 mg total) by mouth 2 (two) times daily. 07/08/21   Shelly Coss, MD  midodrine (PROAMATINE) 5 MG tablet Take Monday, Wednesday, Friday morning BEFORE dialysis 12/22/20   Arnoldo Lenis, MD  multivitamin (RENA-VIT) TABS tablet Take 1 tablet by mouth daily.    [provider]  Na Sulfate-K Sulfate-Mg Sulf (SUPREP BOWEL PREP KIT) 17.5-3.13-1.6 GM/177ML SOLN Take 1 kit by mouth as directed. 06/11/21   Harvel Quale, MD  nitroGLYCERIN (NITROSTAT) 0.4 MG SL tablet Place 1 tablet (0.4 mg total) under the tongue every 5 (five) minutes x 3 doses as needed for chest pain (if no relief after 2nd dose, proceed to the ED for an evaluation or call 911). 02/19/21   Verta Ellen., NP  omeprazole (PRILOSEC) 20 MG capsule Take 1 capsule (20 mg total) by mouth daily. 12/01/20   Rehman, Mechele Dawley, MD  rosuvastatin (CRESTOR) 10 MG tablet Take 1 tablet (10 mg total) by mouth daily. 05/13/21   Strader, Fransisco Hertz, PA-C  sevelamer carbonate (RENVELA) 800 MG tablet Take 3 tablets (2,400 mg total) by mouth 3 (three) times daily with meals. 07/08/21   Adhikari,  Amrit, MD      Allergies    Amlodipine, Aspirin, Nitrofurantoin, Penicillins, Bactrim [sulfamethoxazole-trimethoprim], Contrast media [iodinated contrast media], Iron, Tylenol [acetaminophen], Gabapentin, Iron sucrose, No known allergies, Ranexa [ranolazine], Sucroferric oxyhydroxide, Clopidogrel, Dexilant [dexlansoprazole], Hydralazine, Levaquin [levofloxacin in d5w], Levofloxacin, Morphine, Morphine and related, Pantoprazole, Plavix [clopidogrel bisulfate], Protonix [pantoprazole sodium], and Venofer [ferric oxide]    Review of Systems   Review of Systems  Physical Exam Updated Vital Signs BP  (!) 145/61    Pulse 81    Temp 98.7 F (37.1 C) (Oral)    Resp (!) 24    Ht 5' 1"  (1.549 m)    Wt 54 kg    SpO2 100%    BMI 22.48 kg/m  Physical Exam  ED Results / Procedures / Treatments   Labs (all labs ordered are listed, but only abnormal results are displayed) Labs Reviewed  BASIC METABOLIC PANEL - Abnormal; Notable for the following components:      Result Value   Glucose, Bld 154 (*)    BUN 36 (*)    Creatinine, Ser 6.22 (*)    GFR, Estimated 6 (*)    All other components within normal limits  CBC - Abnormal; Notable for the following components:   RBC 2.05 (*)    Hemoglobin 8.1 (*)    HCT 26.1 (*)    MCV 127.3 (*)    MCH 39.5 (*)    RDW 17.4 (*)    All other components within normal limits  TROPONIN I (HIGH SENSITIVITY) - Abnormal; Notable for the following components:   Troponin I (High Sensitivity) 48 (*)    All other components within normal limits  TROPONIN I (HIGH SENSITIVITY) - Abnormal; Notable for the following components:   Troponin I (High Sensitivity) 60 (*)    All other components within normal limits  RESP PANEL BY RT-PCR (FLU A&B, COVID) [id number]    EKG EKG Interpretation  Date/Time:  Thursday July 15 2021 23:00:54 EST Ventricular Rate:  91 PR Interval:  148 QRS Duration: 130 QT Interval:  380 QTC Calculation: 468 R Axis:   95 Text Interpretation: Sinus rhythm Nonspecific intraventricular conduction delay Left ventricular hypertrophy with QRS widening and repolarization abnormality Baseline wander in lead(s) II V3 No significant change since last tracing Confirmed by Orpah Greek [id number]) on 07/16/2021 12:33:56 AM  Radiology DG Chest 2 View  Result Date: 07/15/2021 CLINICAL DATA:  Central chest pain. EXAM: CHEST - 2 VIEW COMPARISON:  July 06, 2021 FINDINGS: Multiple sternal wires and vascular clips are seen. Mild, stable, diffusely increased lung markings are noted. Mild atelectasis is seen within the bilateral lung bases. Small  bilateral pleural effusions are present. No pneumothorax is identified. There is mild to moderate severity enlargement of the cardiac silhouette. This is increased in severity when compared to the prior study. A coronary artery stent is noted. A radiopaque vascular stent is seen within the soft tissues of the proximal left upper extremity. Multilevel degenerative changes seen throughout the thoracic spine. IMPRESSION: 1. Evidence of prior median sternotomy/CABG. 2. Mild to moderate severity enlargement of the cardiac silhouette which represents a new finding when compared to the exam. Interval development of a pericardial effusion cannot be excluded. Correlation with chest CT is recommended. 3. Mild bibasilar atelectasis. 4. Small bilateral pleural effusions. Electronically Signed   By: Virgina Norfolk M.D.   On: 07/15/2021 23:33   CT Chest Wo Contrast  Result Date: 07/16/2021 CLINICAL DATA:  81 year old female with chest pain. Pleural  effusion. EXAM: CT CHEST WITHOUT CONTRAST TECHNIQUE: Multidetector CT imaging of the chest was performed following the standard protocol without IV contrast. COMPARISON:  Chest radiographs 07/15/2021. [facility] Noncontrast CT 02/26/2018. CT Abdomen and Pelvis 04/06/2020. FINDINGS: Cardiovascular: Prior CABG. Extensive calcified coronary artery and Calcified aortic atherosclerosis. Mild cardiomegaly. No pericardial effusion. Vascular patency is not evaluated in the absence of IV contrast. Mediastinum/Nodes: Stable. Upper limits of normal to mildly reactive appearing mediastinal lymph nodes have not significantly changed since 2019. Lungs/Pleura: Small layering pleural effusions, trace on the left. Major airways are patent. However, there is pronounced bilateral lower lobe and right middle lobe bronchial wall thickening. Somewhat generalized increased pulmonary septal thickening also, and occasional areas of vague peribronchial ground-glass opacity (left lung series  4, image 42 right lung image 54). Superimposed chronic lung scarring. Confluent opacity in the costophrenic angles indeterminate for atelectasis versus infection. Upper Abdomen: Severe Calcified aortic atherosclerosis. Bilateral renal atrophy. Diverticulosis of the visible large bowel. Negative visible noncontrast liver, spleen, pancreas and stomach. Musculoskeletal: Prior sternotomy. Exaggerated thoracic kyphosis. Widespread advanced thoracic disc degeneration. Mild L1 superior endplate compression is new since 2019 but was probably acute last year. No acute osseous abnormality identified. IMPRESSION: 1. Severe Aortic Atherosclerosis (ICD10-I70.0) with prior CABG. Mild cardiomegaly. 2. Small layering pleural effusions (trace on the left) with generalized pulmonary septal thickening and vague asymmetric pulmonary ground-glass opacity new from 2019. Suspect pulmonary interstitial edema, however, there is also new bilateral lower lobe and right middle lobe bronchial wall thickening with confluent lung opacity in both costophrenic angles - suspicious for superimposed acute infection. Alternatively, the entire constellation might reflect a viral respiratory illness. 3. Mild L1 superior endplate compression fracture is new since 2019 but was probably chronic. Electronically Signed   By: Genevie Ann M.D.   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  Patient reports that she has been having increased chest pain recently.  She was admitted to [facility] on 07/06/2021 for chest pain because of suspicion  of NSTEMI.  Troponins were elevated but flat during the hospital stay.  Cardiology recommended no interventions at that time.  Patient reports that prior to that admission and more recently she has been responding to nitroglycerin, but did not tonight.  EKG unchanged from prior.  Presentation differential diagnosis would be ACS, NSTEMI, unstable angina, noncardiac chest pain.  Initial troponin was 48.  This is similar to prior troponins that were seen.  Suspect this is chronic elevation secondary to renal disease.  Second troponin is only slightly elevated at 60.  It is not clear if this is secondary to chronic elevation or if this is related to her known CAD.  While here in the emergency department, patient had a rhythm change.  Initially she was sinus rhythm on cardiac telemetry, but was noted to be 144 bpm, regular tachycardia.  This is suspicious for atrial flutter, does have a history of SVT as well.  Prior to  initiating Cardizem to treat the tachycardia, patient converted back into a sinus rhythm.  Around this time, however, she began to experience respiratory distress.  Repeat lung examination revealed rales throughout.  Suspect this is volume overload secondary to stage renal disease and patient missing a dialysis session.  Patient initiated on BiPAP.  She had significant improvement.  No tamponade physiology noted.  Chest x-ray did show new cardiomegaly.  We are unable to get a CT that would be adequate for CT angiography, performed a noncontrast CT. patient exhibits bilateral pleural effusions and interstitial edema consistent with volume overload.  Additionally, there appears to be infectious etiology.  Will cover with Rocephin and Zithromax.  Patient to be admitted for further management.   CRITICAL CARE Performed by: Orpah Greek   Total critical care time: 32 minutes  Critical care time was exclusive of separately billable procedures and treating other patients.  Critical  care was necessary to treat or prevent imminent or life-threatening deterioration.  Critical care was time spent personally by me on the following activities: development of treatment plan with patient and/or surrogate as well as nursing, discussions with consultants, evaluation of patient's response to treatment, examination of patient, obtaining history from patient or surrogate, ordering and performing treatments and interventions, ordering and review of laboratory studies, ordering and review of radiographic studies, pulse oximetry and re-evaluation of patient's condition.        Final Clinical Impression(s) / ED Diagnoses Final diagnoses:  Chest pain, unspecified type  Acute respiratory failure, unspecified whether with hypoxia or hypercapnia ([place])    Rx / DC Orders ED Discharge Orders     None         Pattye Meda, Gwenyth Allegra, MD 07/16/21 0530

## 2021-07-15 NOTE — ED Triage Notes (Signed)
Pt to the ED with central chest pain described as pressure that began an hour ago. The pt has taken 2 nitro without relief.

## 2021-07-16 ENCOUNTER — Emergency Department (HOSPITAL_COMMUNITY): Payer: Medicare HMO

## 2021-07-16 ENCOUNTER — Inpatient Hospital Stay (HOSPITAL_COMMUNITY): Payer: Medicare HMO

## 2021-07-16 ENCOUNTER — Other Ambulatory Visit: Payer: Self-pay

## 2021-07-16 ENCOUNTER — Observation Stay (HOSPITAL_COMMUNITY): Payer: Medicare HMO

## 2021-07-16 DIAGNOSIS — I471 Supraventricular tachycardia: Secondary | ICD-10-CM | POA: Diagnosis present

## 2021-07-16 DIAGNOSIS — Z79899 Other long term (current) drug therapy: Secondary | ICD-10-CM | POA: Diagnosis not present

## 2021-07-16 DIAGNOSIS — R079 Chest pain, unspecified: Secondary | ICD-10-CM

## 2021-07-16 DIAGNOSIS — I5041 Acute combined systolic (congestive) and diastolic (congestive) heart failure: Secondary | ICD-10-CM | POA: Diagnosis not present

## 2021-07-16 DIAGNOSIS — Z888 Allergy status to other drugs, medicaments and biological substances status: Secondary | ICD-10-CM | POA: Diagnosis not present

## 2021-07-16 DIAGNOSIS — I5030 Unspecified diastolic (congestive) heart failure: Secondary | ICD-10-CM

## 2021-07-16 DIAGNOSIS — Z992 Dependence on renal dialysis: Secondary | ICD-10-CM | POA: Diagnosis not present

## 2021-07-16 DIAGNOSIS — I25110 Atherosclerotic heart disease of native coronary artery with unstable angina pectoris: Secondary | ICD-10-CM | POA: Diagnosis present

## 2021-07-16 DIAGNOSIS — J811 Chronic pulmonary edema: Secondary | ICD-10-CM | POA: Diagnosis present

## 2021-07-16 DIAGNOSIS — D631 Anemia in chronic kidney disease: Secondary | ICD-10-CM

## 2021-07-16 DIAGNOSIS — I5043 Acute on chronic combined systolic (congestive) and diastolic (congestive) heart failure: Secondary | ICD-10-CM | POA: Diagnosis present

## 2021-07-16 DIAGNOSIS — I200 Unstable angina: Secondary | ICD-10-CM | POA: Diagnosis not present

## 2021-07-16 DIAGNOSIS — K5520 Angiodysplasia of colon without hemorrhage: Secondary | ICD-10-CM | POA: Diagnosis not present

## 2021-07-16 DIAGNOSIS — I255 Ischemic cardiomyopathy: Secondary | ICD-10-CM | POA: Diagnosis present

## 2021-07-16 DIAGNOSIS — I4892 Unspecified atrial flutter: Secondary | ICD-10-CM | POA: Diagnosis present

## 2021-07-16 DIAGNOSIS — N2581 Secondary hyperparathyroidism of renal origin: Secondary | ICD-10-CM | POA: Diagnosis present

## 2021-07-16 DIAGNOSIS — D62 Acute posthemorrhagic anemia: Secondary | ICD-10-CM | POA: Diagnosis not present

## 2021-07-16 DIAGNOSIS — Z20822 Contact with and (suspected) exposure to covid-19: Secondary | ICD-10-CM | POA: Diagnosis present

## 2021-07-16 DIAGNOSIS — N186 End stage renal disease: Secondary | ICD-10-CM | POA: Diagnosis present

## 2021-07-16 DIAGNOSIS — I6523 Occlusion and stenosis of bilateral carotid arteries: Secondary | ICD-10-CM | POA: Diagnosis not present

## 2021-07-16 DIAGNOSIS — I2510 Atherosclerotic heart disease of native coronary artery without angina pectoris: Secondary | ICD-10-CM | POA: Diagnosis not present

## 2021-07-16 DIAGNOSIS — E871 Hypo-osmolality and hyponatremia: Secondary | ICD-10-CM | POA: Diagnosis not present

## 2021-07-16 DIAGNOSIS — Z85038 Personal history of other malignant neoplasm of large intestine: Secondary | ICD-10-CM | POA: Diagnosis not present

## 2021-07-16 DIAGNOSIS — R778 Other specified abnormalities of plasma proteins: Secondary | ICD-10-CM | POA: Diagnosis not present

## 2021-07-16 DIAGNOSIS — I209 Angina pectoris, unspecified: Secondary | ICD-10-CM | POA: Diagnosis not present

## 2021-07-16 DIAGNOSIS — J9600 Acute respiratory failure, unspecified whether with hypoxia or hypercapnia: Secondary | ICD-10-CM | POA: Diagnosis not present

## 2021-07-16 DIAGNOSIS — I132 Hypertensive heart and chronic kidney disease with heart failure and with stage 5 chronic kidney disease, or end stage renal disease: Secondary | ICD-10-CM | POA: Diagnosis present

## 2021-07-16 DIAGNOSIS — J9601 Acute respiratory failure with hypoxia: Secondary | ICD-10-CM | POA: Diagnosis present

## 2021-07-16 DIAGNOSIS — Z951 Presence of aortocoronary bypass graft: Secondary | ICD-10-CM | POA: Diagnosis not present

## 2021-07-16 DIAGNOSIS — D696 Thrombocytopenia, unspecified: Secondary | ICD-10-CM | POA: Diagnosis present

## 2021-07-16 DIAGNOSIS — I10 Essential (primary) hypertension: Secondary | ICD-10-CM | POA: Diagnosis not present

## 2021-07-16 DIAGNOSIS — Z886 Allergy status to analgesic agent status: Secondary | ICD-10-CM | POA: Diagnosis not present

## 2021-07-16 DIAGNOSIS — I214 Non-ST elevation (NSTEMI) myocardial infarction: Secondary | ICD-10-CM | POA: Diagnosis present

## 2021-07-16 DIAGNOSIS — I480 Paroxysmal atrial fibrillation: Secondary | ICD-10-CM | POA: Diagnosis present

## 2021-07-16 DIAGNOSIS — E039 Hypothyroidism, unspecified: Secondary | ICD-10-CM | POA: Diagnosis present

## 2021-07-16 DIAGNOSIS — K219 Gastro-esophageal reflux disease without esophagitis: Secondary | ICD-10-CM | POA: Diagnosis present

## 2021-07-16 DIAGNOSIS — D6489 Other specified anemias: Secondary | ICD-10-CM

## 2021-07-16 LAB — ECHOCARDIOGRAM COMPLETE
AR max vel: 1.33 cm2
AV Area VTI: 1.6 cm2
AV Area mean vel: 1.48 cm2
AV Mean grad: 4 mmHg
AV Peak grad: 8.6 mmHg
Ao pk vel: 1.47 m/s
Area-P 1/2: 3.54 cm2
Calc EF: 33.5 %
Height: 61 in
MV M vel: 5 m/s
MV Peak grad: 100 mmHg
MV VTI: 1.21 cm2
Radius: 0.5 cm
S' Lateral: 3.4 cm
Single Plane A2C EF: 31.7 %
Single Plane A4C EF: 37.8 %
Weight: 1904 oz

## 2021-07-16 LAB — LIPID PANEL
Cholesterol: 131 mg/dL (ref 0–200)
HDL: 60 mg/dL (ref >40)
LDL Cholesterol: 53 mg/dL (ref 0–99)
Total CHOL/HDL Ratio: 2.2 RATIO
Triglycerides: 90 mg/dL (ref <150)
VLDL: 18 mg/dL (ref 0–40)

## 2021-07-16 LAB — HEMOGLOBIN A1C
Hgb A1c MFr Bld: 4.6 % — ABNORMAL LOW (ref 4.8–5.6)
Mean Plasma Glucose: 85.32 mg/dL

## 2021-07-16 LAB — BASIC METABOLIC PANEL
Anion gap: 14 (ref 5–15)
BUN: 36 mg/dL — ABNORMAL HIGH (ref 8–23)
CO2: 24 mmol/L (ref 22–32)
Calcium: 9.1 mg/dL (ref 8.9–10.3)
Chloride: 101 mmol/L (ref 98–111)
Creatinine, Ser: 6.22 mg/dL — ABNORMAL HIGH (ref 0.44–1.00)
GFR, Estimated: 6 mL/min — ABNORMAL LOW (ref >60)
Glucose, Bld: 154 mg/dL — ABNORMAL HIGH (ref 70–99)
Potassium: 4.8 mmol/L (ref 3.5–5.1)
Sodium: 139 mmol/L (ref 135–145)

## 2021-07-16 LAB — CBC
HCT: 26.1 % — ABNORMAL LOW (ref 36.0–46.0)
Hemoglobin: 8.1 g/dL — ABNORMAL LOW (ref 12.0–15.0)
MCH: 39.5 pg — ABNORMAL HIGH (ref 26.0–34.0)
MCHC: 31 g/dL (ref 30.0–36.0)
MCV: 127.3 fL — ABNORMAL HIGH (ref 80.0–100.0)
Platelets: DECREASED 10*3/uL (ref 150–400)
RBC: 2.05 MIL/uL — ABNORMAL LOW (ref 3.87–5.11)
RDW: 17.4 % — ABNORMAL HIGH (ref 11.5–15.5)
WBC: 9 10*3/uL (ref 4.0–10.5)
nRBC: 0 % (ref 0.0–0.2)

## 2021-07-16 LAB — TROPONIN I (HIGH SENSITIVITY)
Troponin I (High Sensitivity): 237 ng/L (ref <18)
Troponin I (High Sensitivity): 279 ng/L (ref <18)
Troponin I (High Sensitivity): 313 ng/L (ref <18)
Troponin I (High Sensitivity): 356 ng/L (ref <18)
Troponin I (High Sensitivity): 449 ng/L (ref <18)
Troponin I (High Sensitivity): 48 ng/L — ABNORMAL HIGH (ref <18)
Troponin I (High Sensitivity): 60 ng/L — ABNORMAL HIGH (ref <18)

## 2021-07-16 LAB — RESP PANEL BY RT-PCR (FLU A&B, COVID) ARPGX2
Influenza A by PCR: NEGATIVE
Influenza B by PCR: NEGATIVE
SARS Coronavirus 2 by RT PCR: NEGATIVE

## 2021-07-16 LAB — PROCALCITONIN: Procalcitonin: 0.12 ng/mL

## 2021-07-16 LAB — HEPARIN LEVEL (UNFRACTIONATED): Heparin Unfractionated: 0.46 IU/mL (ref 0.30–0.70)

## 2021-07-16 IMAGING — CT CT CHEST W/O CM
2 of 4 series · 15 of 36 positions shown, 18 images · non-contrast
Comparison: Chest radiographs [DATE]. [HOSPITAL] [facility]
Noncontrast CT [DATE].

CT Abdomen and Pelvis [DATE].

CLINICAL DATA: 80-year-old female with chest pain. Pleural
effusion.

EXAM:
CT CHEST WITHOUT CONTRAST
TECHNIQUE: Multidetector CT imaging of the chest was performed following the
standard protocol without IV contrast.

[Series 2: routine chest without · axial · non-contrast · 0.57mm/px · z∈[+1316,+1548]mm · 12 of 138 slices shown, 15 images]
[im 11/138  mediastinal]
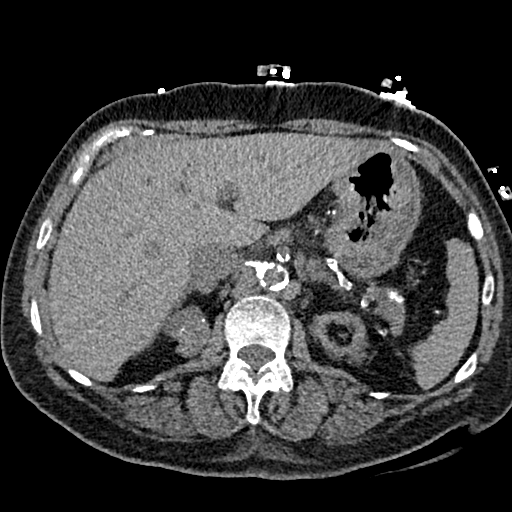
[im 11/138  lung]
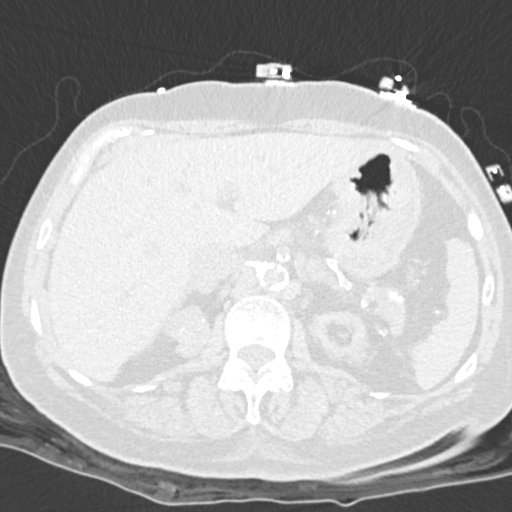
[im 22/138  lung]
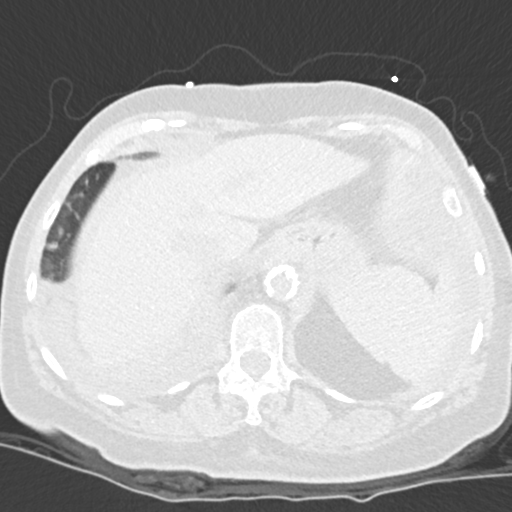
[im 32/138  lung]
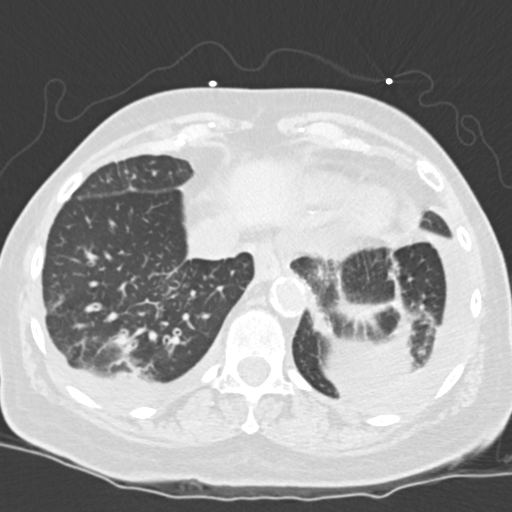
[im 43/138  lung]
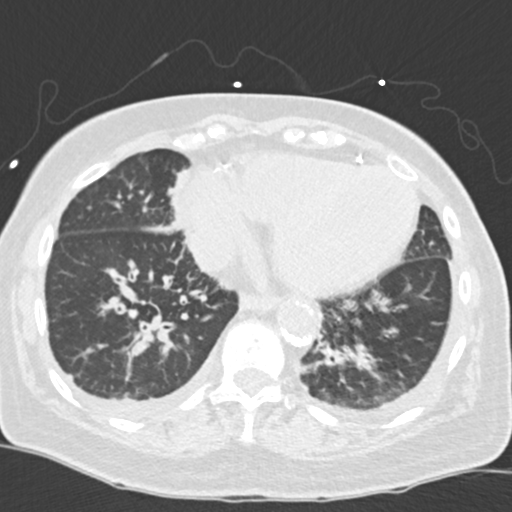
[im 53/138  mediastinal]
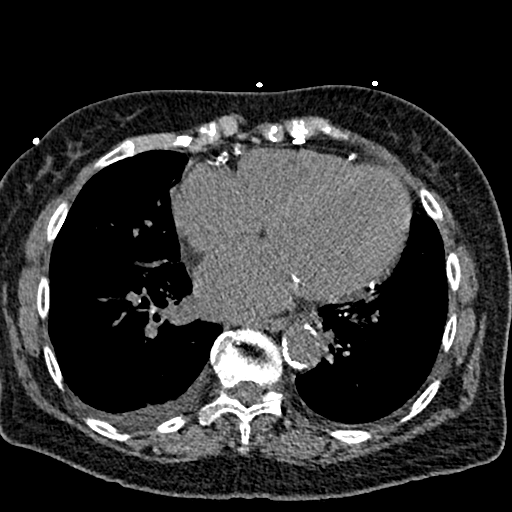
[im 53/138  lung]
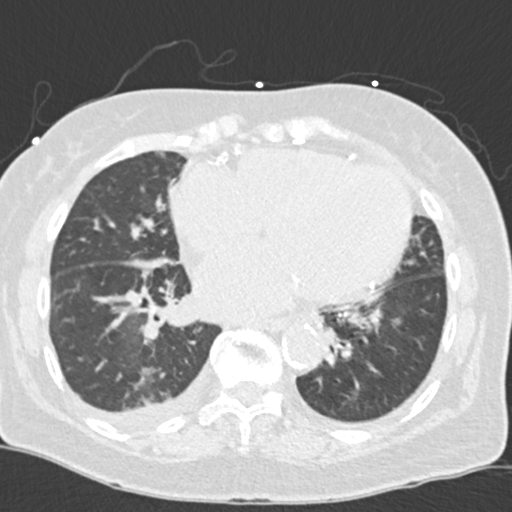
[im 64/138  lung]
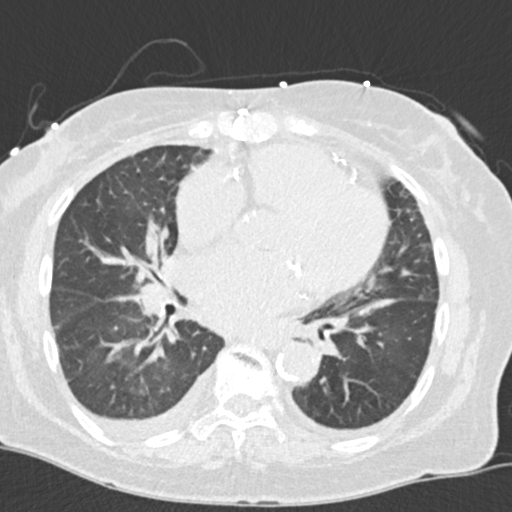
[im 74/138  lung]
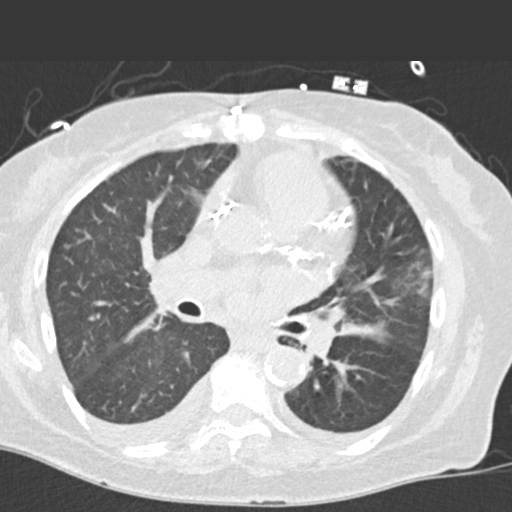
[im 85/138  lung]
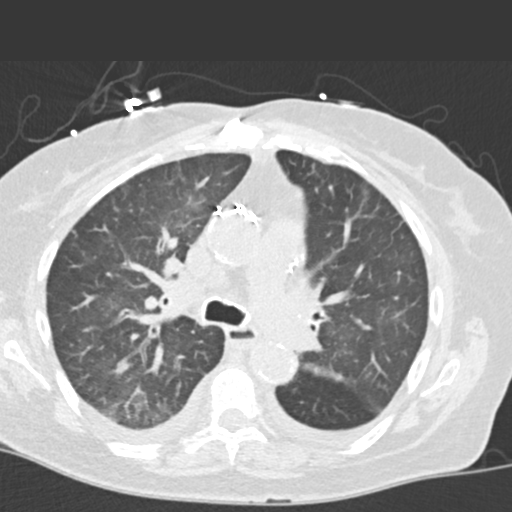
[im 95/138  mediastinal]
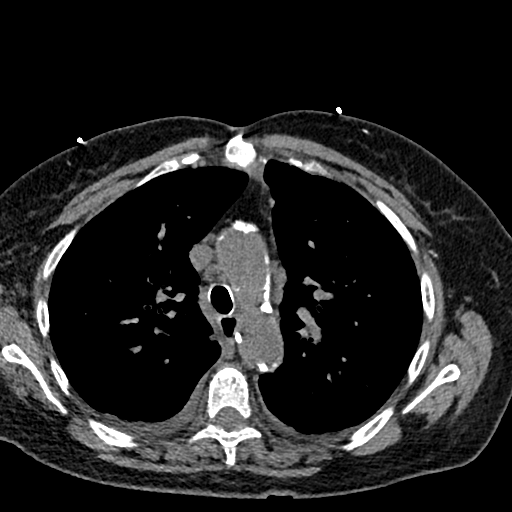
[im 95/138  lung]
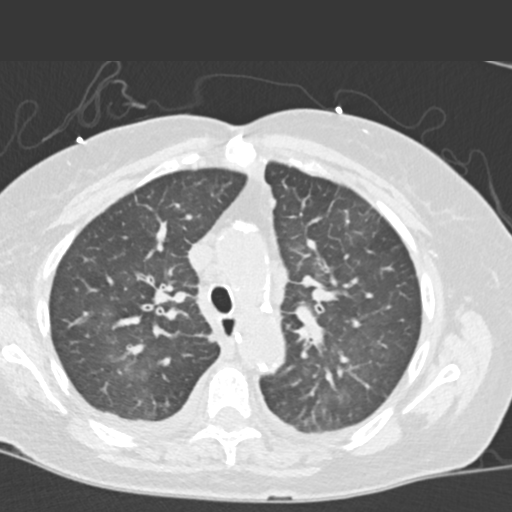
[im 106/138  lung]
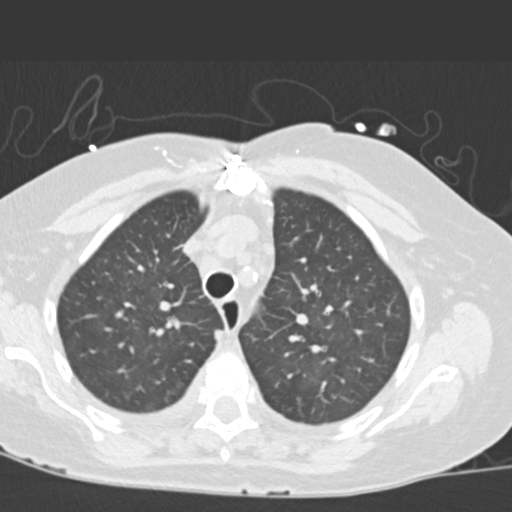
[im 116/138  lung]
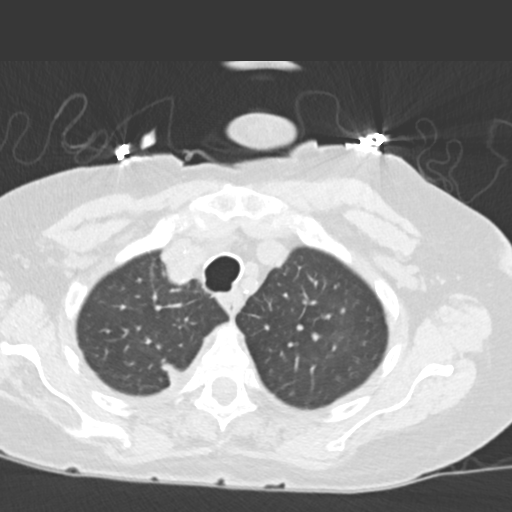
[im 127/138  lung]
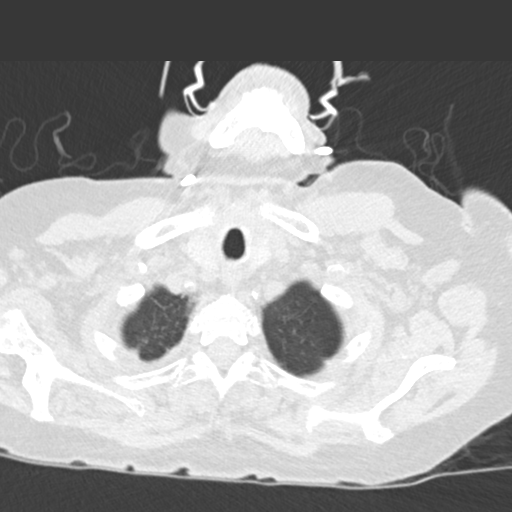

[Series 6: coronal · coronal · 0.59mm/px · 3 of 108 slices shown]
[im 22/108  lung]
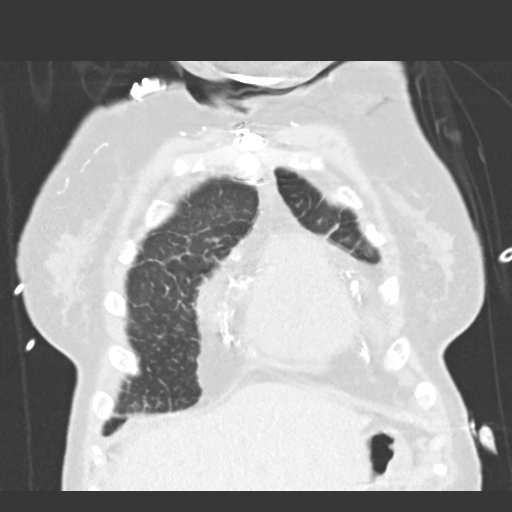
[im 43/108  lung]
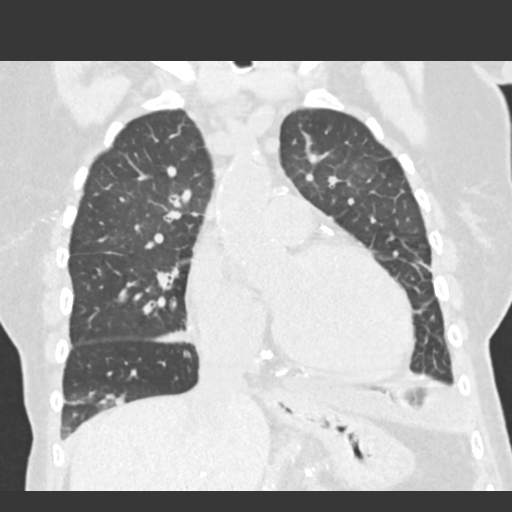
[im 65/108  lung]
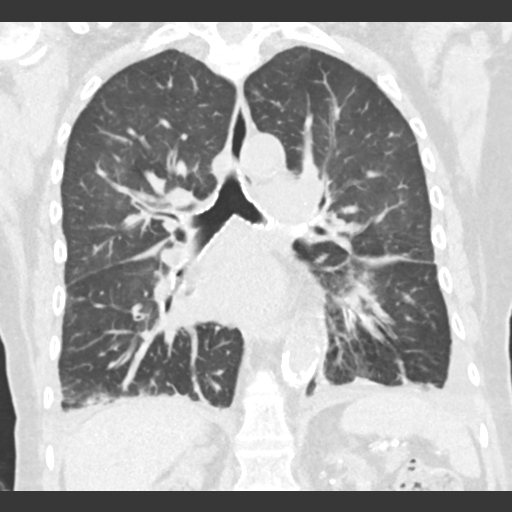

[15 of 36 positions shown; findings below may reference images not displayed]

FINDINGS: Cardiovascular: Prior CABG. Extensive calcified coronary artery and
Calcified aortic atherosclerosis. Mild cardiomegaly. No pericardial
effusion. Vascular patency is not evaluated in the absence of IV
contrast.

Mediastinum/Nodes: Stable. Upper limits of normal to mildly reactive
appearing mediastinal lymph nodes have not significantly changed
since [RX].

Lungs/Pleura: Small layering pleural effusions, trace on the left.

Major airways are patent. However, there is pronounced bilateral
lower lobe and right middle lobe bronchial wall thickening. Somewhat
generalized increased pulmonary septal thickening also, and
occasional areas of vague peribronchial ground-glass opacity (left
lung series 4, image 42 right lung image 54). Superimposed chronic
lung scarring. Confluent opacity in the costophrenic angles
indeterminate for atelectasis versus infection.

Upper Abdomen: Severe Calcified aortic atherosclerosis. Bilateral
renal atrophy. Diverticulosis of the visible large bowel. Negative
visible noncontrast liver, spleen, pancreas and stomach.

Musculoskeletal: Prior sternotomy. Exaggerated thoracic kyphosis.
Widespread advanced thoracic disc degeneration. Mild L1 superior
endplate compression is new since [RX] but was probably acute last
year. No acute osseous abnormality identified.
IMPRESSION: 1. Severe Aortic Atherosclerosis ([RX]-[RX]) with prior CABG. Mild
cardiomegaly.

2. Small layering pleural effusions (trace on the left) with
generalized pulmonary septal thickening and vague asymmetric
pulmonary ground-glass opacity new from [RX].
Suspect pulmonary interstitial edema, however, there is also new
bilateral lower lobe and right middle lobe bronchial wall thickening
with confluent lung opacity in both costophrenic angles - suspicious
for superimposed acute infection. Alternatively, the entire
constellation might reflect a viral respiratory illness.

3. Mild L1 superior endplate compression fracture is new since [RX]
but was probably chronic.

## 2021-07-16 MED ORDER — LIDOCAINE HCL (PF) 1 % IJ SOLN: 5.0000 mL | INTRAMUSCULAR | Status: DC | PRN

## 2021-07-16 MED ORDER — ROSUVASTATIN CALCIUM 5 MG PO TABS
10.0000 mg | ORAL_TABLET | Freq: Every day | ORAL | Status: DC
  Administered 2021-07-16 – 2021-07-24 (×8): 10 mg via ORAL
  Filled 2021-07-16 (×4): qty 1
  Filled 2021-07-16 (×2): qty 2
  Filled 2021-07-16 (×3): qty 1

## 2021-07-16 MED ORDER — INSULIN ASPART 100 UNIT/ML IJ SOLN: 0.0000 [IU] | Freq: Three times a day (TID) | INTRAMUSCULAR | Status: DC

## 2021-07-16 MED ORDER — ISOSORBIDE MONONITRATE ER 30 MG PO TB24: 30.0000 mg | ORAL_TABLET | Freq: Every day | ORAL | Status: DC

## 2021-07-16 MED ORDER — PENTAFLUOROPROP-TETRAFLUOROETH EX AERO
1.0000 "application " | INHALATION_SPRAY | CUTANEOUS | Status: DC | PRN
  Filled 2021-07-16: qty 30

## 2021-07-16 MED ORDER — ASPIRIN EC 81 MG PO TBEC
81.0000 mg | DELAYED_RELEASE_TABLET | Freq: Every day | ORAL | Status: DC
  Administered 2021-07-16 – 2021-07-24 (×9): 81 mg via ORAL
  Filled 2021-07-16 (×9): qty 1

## 2021-07-16 MED ORDER — HEPARIN (PORCINE) 25000 UT/250ML-% IV SOLN
750.0000 [IU]/h | INTRAVENOUS | Status: DC
  Administered 2021-07-16: 650 [IU]/h via INTRAVENOUS
  Filled 2021-07-16: qty 250

## 2021-07-16 MED ORDER — HEPARIN BOLUS VIA INFUSION
3250.0000 [IU] | Freq: Once | INTRAVENOUS | Status: AC
  Administered 2021-07-16: 3250 [IU] via INTRAVENOUS

## 2021-07-16 MED ORDER — RENA-VITE PO TABS
1.0000 | ORAL_TABLET | Freq: Every day | ORAL | Status: DC
  Administered 2021-07-16 – 2021-07-24 (×8): 1 via ORAL
  Filled 2021-07-16 (×9): qty 1

## 2021-07-16 MED ORDER — AMIODARONE HCL 200 MG PO TABS
200.0000 mg | ORAL_TABLET | Freq: Every day | ORAL | Status: DC
  Administered 2021-07-17 – 2021-07-24 (×6): 200 mg via ORAL
  Filled 2021-07-16 (×8): qty 1

## 2021-07-16 MED ORDER — ISOSORBIDE MONONITRATE ER 60 MG PO TB24
90.0000 mg | ORAL_TABLET | Freq: Two times a day (BID) | ORAL | Status: DC
  Administered 2021-07-16 – 2021-07-24 (×13): 90 mg via ORAL
  Filled 2021-07-16: qty 1
  Filled 2021-07-16 (×2): qty 2
  Filled 2021-07-16 (×2): qty 1
  Filled 2021-07-16 (×4): qty 2
  Filled 2021-07-16: qty 1
  Filled 2021-07-16 (×2): qty 2
  Filled 2021-07-16 (×2): qty 1
  Filled 2021-07-16: qty 2
  Filled 2021-07-16 (×2): qty 1

## 2021-07-16 MED ORDER — SODIUM CHLORIDE 0.9 % IV SOLN
1.0000 g | INTRAVENOUS | Status: DC
  Administered 2021-07-16 – 2021-07-17 (×2): 1 g via INTRAVENOUS
  Filled 2021-07-16 (×2): qty 10

## 2021-07-16 MED ORDER — HYDROXYZINE HCL 25 MG PO TABS: 100.0000 mg | ORAL_TABLET | Freq: Every day | ORAL | Status: DC | PRN

## 2021-07-16 MED ORDER — LIDOCAINE-PRILOCAINE 2.5-2.5 % EX CREA
1.0000 "application " | TOPICAL_CREAM | CUTANEOUS | Status: DC | PRN
  Filled 2021-07-16: qty 5

## 2021-07-16 MED ORDER — METOPROLOL TARTRATE 12.5 MG HALF TABLET
12.5000 mg | ORAL_TABLET | Freq: Two times a day (BID) | ORAL | Status: DC
  Administered 2021-07-17 – 2021-07-24 (×11): 12.5 mg via ORAL
  Filled 2021-07-16 (×17): qty 1

## 2021-07-16 MED ORDER — MIDODRINE HCL 5 MG PO TABS
5.0000 mg | ORAL_TABLET | ORAL | Status: DC
  Administered 2021-07-16 – 2021-07-23 (×3): 5 mg via ORAL
  Filled 2021-07-16 (×3): qty 1

## 2021-07-16 MED ORDER — SODIUM CHLORIDE 0.9 % IV SOLN: 100.0000 mL | INTRAVENOUS | Status: DC | PRN

## 2021-07-16 MED ORDER — CHLORHEXIDINE GLUCONATE CLOTH 2 % EX PADS
6.0000 | MEDICATED_PAD | Freq: Every day | CUTANEOUS | Status: DC
  Administered 2021-07-16 – 2021-07-22 (×5): 6 via TOPICAL

## 2021-07-16 MED ORDER — NITROGLYCERIN 0.4 MG SL SUBL: 0.4000 mg | SUBLINGUAL_TABLET | SUBLINGUAL | Status: DC | PRN

## 2021-07-16 MED ORDER — LEVOTHYROXINE SODIUM 25 MCG PO TABS
25.0000 ug | ORAL_TABLET | Freq: Every day | ORAL | Status: DC
  Administered 2021-07-16 – 2021-07-24 (×9): 25 ug via ORAL
  Filled 2021-07-16 (×9): qty 1

## 2021-07-16 MED ORDER — BISACODYL 5 MG PO TBEC: 5.0000 mg | DELAYED_RELEASE_TABLET | Freq: Every day | ORAL | Status: DC | PRN

## 2021-07-16 MED ORDER — SEVELAMER CARBONATE 800 MG PO TABS
2400.0000 mg | ORAL_TABLET | Freq: Three times a day (TID) | ORAL | Status: DC
  Administered 2021-07-16 – 2021-07-23 (×17): 2400 mg via ORAL
  Filled 2021-07-16 (×20): qty 3

## 2021-07-16 MED ORDER — DOXYCYCLINE HYCLATE 100 MG PO TABS
100.0000 mg | ORAL_TABLET | Freq: Two times a day (BID) | ORAL | Status: AC
  Administered 2021-07-17 (×2): 100 mg via ORAL
  Filled 2021-07-16 (×2): qty 1

## 2021-07-16 MED ORDER — INSULIN ASPART 100 UNIT/ML IJ SOLN: 2.0000 [IU] | Freq: Three times a day (TID) | INTRAMUSCULAR | Status: DC

## 2021-07-16 MED ORDER — ONDANSETRON HCL 4 MG PO TABS: 4.0000 mg | ORAL_TABLET | Freq: Four times a day (QID) | ORAL | Status: DC | PRN

## 2021-07-16 MED ORDER — ONDANSETRON HCL 4 MG/2ML IJ SOLN
4.0000 mg | Freq: Four times a day (QID) | INTRAMUSCULAR | Status: DC | PRN
  Administered 2021-07-16: 08:00:00 4 mg via INTRAVENOUS
  Filled 2021-07-16: qty 2

## 2021-07-16 MED ORDER — SODIUM CHLORIDE 0.9 % IV SOLN
500.0000 mg | INTRAVENOUS | Status: DC
  Administered 2021-07-16 – 2021-07-17 (×2): 500 mg via INTRAVENOUS
  Filled 2021-07-16 (×2): qty 5

## 2021-07-16 MED ORDER — PANTOPRAZOLE SODIUM 40 MG PO TBEC: 40.0000 mg | DELAYED_RELEASE_TABLET | Freq: Every day | ORAL | Status: DC

## 2021-07-16 MED ORDER — HEPARIN SODIUM (PORCINE) 5000 UNIT/ML IJ SOLN
5000.0000 [IU] | Freq: Three times a day (TID) | INTRAMUSCULAR | Status: DC
  Administered 2021-07-16: 5000 [IU] via SUBCUTANEOUS
  Filled 2021-07-16: qty 1

## 2021-07-16 MED ORDER — ALPRAZOLAM 0.5 MG PO TABS
0.5000 mg | ORAL_TABLET | Freq: Every day | ORAL | Status: DC
  Administered 2021-07-16 – 2021-07-23 (×8): 0.5 mg via ORAL
  Filled 2021-07-16 (×8): qty 1

## 2021-07-16 MED ORDER — TRAMADOL HCL 50 MG PO TABS
100.0000 mg | ORAL_TABLET | Freq: Once | ORAL | Status: AC
  Administered 2021-07-16: 100 mg via ORAL
  Filled 2021-07-16: qty 2

## 2021-07-16 NOTE — H&P (Signed)
History and Physical  Mullica [name] SFK:[id number] DOB: 22-Feb-1941 DOA: 07/15/2021  PCP: [facility]  Patient coming from: home by EMS Level of care: Stepdown  I have personally briefly reviewed patient's old medical records in [place]  Chief Complaint: chest pain   HPI: Shawna [name] is a 81 y.o. female with medical history significant for significant coronary artery disease, unstable angina, status post CABG in 2013, hypothyroidism, carotid artery disease, end-stage renal disease on hemodialysis, chronic diastolic heart failure, history of colon cancer, colonic AVM, osteoarthritis, GERD, multiple hospitalizations for chest pain recently discharged from [facility] on 07/08/2021 after being admitted for chest pain concerning for unstable angina.  During the hospitalization her Imdur was decreased to 30 mg daily due to soft blood pressures.  It was also noted that she had a history of paroxysmal atrial fibrillation.  She had not been anticoagulated secondary to GI bleeding and had been treated with amiodarone and metoprolol.  She reports receiving hemodialysis Monday Wednesday Fridays.  She reported she missed her last dialysis session.  She reported she presented to the ED with central chest pain described as chest pressure that feels similar to prior heart attacks that started approximately 1 hour prior to arrival she had been taking nitroglycerin with no relief in symptoms prompting her to come to the emergency department.  It is also reported that she took 2 aspirin tablets prior to coming to the emergency department.  While in the emergency department patient was noted to have atrial flutter on the cardiac telemetry monitor.  Fortunately she converted back to a sinus rhythm without treatment.  Patient initially thought to be volume overloaded from missed dialysis session and started on BiPAP in the emergency department with improvement in symptoms.  CT chest  was suspicious for infection in the lungs and patient was started on ceftriaxone and azithromycin.  SARS 2 coronavirus and influenza testing negative.  HS troponin 48, 60.   Review of Systems: Review of Systems  Constitutional:  Positive for malaise/fatigue. Negative for chills, diaphoresis, fever and weight loss.  HENT: Negative.    Eyes: Negative.   Respiratory:  Positive for cough and shortness of breath. Negative for hemoptysis, sputum production and wheezing.   Cardiovascular:  Positive for chest pain, palpitations, orthopnea, leg swelling and PND. Negative for claudication.  Gastrointestinal:  Positive for heartburn. Negative for abdominal pain, blood in stool, constipation, diarrhea, melena, nausea and vomiting.  Genitourinary: Negative.   Musculoskeletal:  Positive for myalgias.  Skin: Negative.   Neurological: Negative.   Endo/Heme/Allergies: Negative.   Psychiatric/Behavioral: Negative.    All other systems reviewed and are negative.   Past Medical History:  Diagnosis Date   Acute on chronic respiratory failure with hypoxia ([place]) 10/10/2016   Anxiety    Arthritis    AVM (arteriovenous malformation) of colon    CAD (coronary artery disease)    a. s/p CABG in 2013 b. DES to D1 in 10/2016. c. cath in 07/2018 showing patent grafts with occlusion of D1 at prior stent site and progression of PDA disease --> medical management recommended   Carotid artery disease ([place])    a. 17-49% LICA, 10/4965    Chronic anemia    Chronic bronchitis (HCC)    Chronic diastolic CHF (congestive heart failure) ([place])    a. 02/2012 Echo EF 60-65%, nl wall motion, Gr 1 DD, mod MR   Colon cancer ([place]) 1992   Esophageal stricture  ESRD on hemodialysis ([place])    ESRD due to HTN, started dialysis 2011 and gets HD at [facility] with Dr Hinda Lenis on MWF schedule.  Access is LUA AVF as of Sept 2014.    GERD (gastroesophageal reflux disease)    High cholesterol 12/2011   History of blood transfusion 07/2011;  12/2011; 01/2012 X 2; 04/2012   History of gout    History of lower GI bleeding    Hypertension    Iron deficiency anemia    Jugular vein occlusion, right (HCC)    Mitral regurgitation    a. Moderate by echo, 02/2012   Myocardial infarction [facility])    NSVT (nonsustained ventricular tachycardia)    Ovarian cancer ([place]) 1992   PAF (paroxysmal atrial fibrillation) ([place])    Pneumonia ~ 2009   PUD (peptic ulcer disease)    TIA (transient ischemic attack)     Past Surgical History:  Procedure Laterality Date   A/V SHUNTOGRAM Left 03/19/2019   Procedure: A/V SHUNTOGRAM;  Surgeon: Katha Cabal, MD;  Location: [place] CV LAB;  Service: Cardiovascular;  Laterality: Left;   ABDOMINAL HYSTERECTOMY  1992   APPENDECTOMY  06/1990   AV FISTULA PLACEMENT  07/2009   left upper arm   AV FISTULA PLACEMENT Right 09/06/2016   Procedure: RIGHT FOREARM ARTERIOVENOUS (AV) GRAFT;  Surgeon: Elam Dutch, MD;  Location: [place];  Service: Vascular;  Laterality: Right;   AV FISTULA PLACEMENT N/A 02/24/2017   Procedure: INSERTION OF ARTERIOVENOUS (AV) GORE-TEX GRAFT ARM (BRACHIAL AXILLARY);  Surgeon: Katha Cabal, MD;  Location: [facility] ORS;  Service: Vascular;  Laterality: N/A;   [place] Right 09/06/2016   Procedure: REMOVAL OF Right Arm ARTERIOVENOUS GORETEX GRAFT and Vein Patch angioplasty of brachial artery;  Surgeon: Angelia Mould, MD;  Location: [place];  Service: Vascular;  Laterality: Right;   BIOPSY  09/26/2019   Procedure: BIOPSY;  Surgeon: Rogene Houston, MD;  [facility];  Service: Endoscopy;;   COLON RESECTION  1992   COLON SURGERY     COLONOSCOPY N/A 03/09/2019   Procedure: COLONOSCOPY;  Surgeon: Rogene Houston, MD;  [facility];  Service: Endoscopy;  Laterality: N/A;   COLONOSCOPY WITH PROPOFOL N/A 06/08/2021   Procedure: COLONOSCOPY WITH PROPOFOL;  Surgeon: Harvel Quale, MD;  [facility];  Service: Gastroenterology;   Laterality: N/A;  9:05 /Patient is on dialysis Mon Wed Fri   CORONARY ANGIOPLASTY WITH STENT PLACEMENT  12/15/11   "2"   CORONARY ANGIOPLASTY WITH STENT PLACEMENT  y/2013   "1; makes total of 3" (05/02/2012)   CORONARY ARTERY BYPASS GRAFT  06/13/2012   Procedure: CORONARY ARTERY BYPASS GRAFTING (CABG);  Surgeon: Grace Isaac, MD;  Location: [place];  Service: Open Heart Surgery;  Laterality: N/A;  cabg x four;  using left internal mammary artery, and left leg greater saphenous vein harvested endoscopically   CORONARY STENT INTERVENTION N/A 10/13/2016   Procedure: Coronary Stent Intervention;  Surgeon: Troy Sine, MD;  Location: [place] CV LAB;  Service: Cardiovascular;  Laterality: N/A;   DIALYSIS/PERMA CATHETER REMOVAL N/A 04/18/2017   Procedure: DIALYSIS/PERMA CATHETER REMOVAL;  Surgeon: Katha Cabal, MD;  Location: [place] [facility];  Service: Cardiovascular;  Laterality: N/A;   DILATION AND CURETTAGE OF UTERUS     ENTEROSCOPY N/A 06/08/2021   Procedure: PUSH ENTEROSCOPY;  Surgeon: Harvel Quale, MD;  [facility];  Service: Gastroenterology;  Laterality: N/A;  ESOPHAGOGASTRODUODENOSCOPY  01/20/2012   Procedure: ESOPHAGOGASTRODUODENOSCOPY (EGD);  Surgeon: Ladene Artist, MD,FACG;  Location: [facility] ENDOSCOPY;  Service: Endoscopy;  Laterality: N/A;   ESOPHAGOGASTRODUODENOSCOPY N/A 03/26/2013   Procedure: ESOPHAGOGASTRODUODENOSCOPY (EGD);  Surgeon: Irene Shipper, MD;  Location: [facility] ENDOSCOPY;  Service: Endoscopy;  Laterality: N/A;   ESOPHAGOGASTRODUODENOSCOPY N/A 04/30/2015   Procedure: ESOPHAGOGASTRODUODENOSCOPY (EGD);  Surgeon: Rogene Houston, MD;  [facility];  Service: Endoscopy;  Laterality: N/A;  1pm - moved to 10/20 @ 1:10   ESOPHAGOGASTRODUODENOSCOPY N/A 07/29/2016   Procedure: ESOPHAGOGASTRODUODENOSCOPY (EGD);  Surgeon: Manus Gunning, MD;  Location: [place];  Service: Gastroenterology;  Laterality: N/A;  enteroscopy    ESOPHAGOGASTRODUODENOSCOPY N/A 09/26/2019   Procedure: ESOPHAGOGASTRODUODENOSCOPY (EGD);  Surgeon: Rogene Houston, MD;  [facility];  Service: Endoscopy;  Laterality: N/A;  1250   ESOPHAGOGASTRODUODENOSCOPY (EGD) WITH PROPOFOL N/A 02/05/2021   Procedure: ESOPHAGOGASTRODUODENOSCOPY (EGD) WITH PROPOFOL;  Surgeon: Eloise Harman, DO;  [facility];  Service: Endoscopy;  Laterality: N/A;   GIVENS CAPSULE STUDY N/A 03/07/2019   Procedure: GIVENS CAPSULE STUDY;  Surgeon: Rogene Houston, MD;  [facility];  Service: Endoscopy;  Laterality: N/A;  7:30   GIVENS CAPSULE STUDY N/A 04/22/2021   Procedure: GIVENS CAPSULE STUDY;  Surgeon: Rogene Houston, MD;  [facility];  Service: Endoscopy;  Laterality: N/A;  7:30   INSERTION OF DIALYSIS CATHETER N/A 10/05/2020   Procedure: ABORTED TUNNELED DIALYSIS CATHETER PLACEMENT RIGHT INTERNAL JUGULAR VEIN ;  Surgeon: Virl Cagey, MD;  [facility];  Service: General;  Laterality: N/A;   INTRAOPERATIVE TRANSESOPHAGEAL ECHOCARDIOGRAM  06/13/2012   Procedure: INTRAOPERATIVE TRANSESOPHAGEAL ECHOCARDIOGRAM;  Surgeon: Grace Isaac, MD;  Location: [place];  Service: Open Heart Surgery;  Laterality: N/A;   IR DIALY SHUNT INTRO NEEDLE/INTRACATH INITIAL W/IMG LEFT Left 10/06/2020   IR FLUORO GUIDE CV LINE RIGHT  06/17/2020   IR GENERIC HISTORICAL  07/26/2016   IR FLUORO GUIDE CV LINE RIGHT 07/26/2016 Greggory Keen, MD [facility]   IR GENERIC HISTORICAL  07/26/2016   IR US GUIDE VASC ACCESS RIGHT 07/26/2016 Greggory Keen, MD [facility]   IR GENERIC HISTORICAL  08/02/2016   IR US GUIDE VASC ACCESS RIGHT 08/02/2016 Greggory Keen, MD [facility]   IR GENERIC HISTORICAL  08/02/2016   IR FLUORO GUIDE CV LINE RIGHT 08/02/2016 Greggory Keen, MD [facility]   IR RADIOLOGY PERIPHERAL GUIDED IV START  03/28/2017   IR REMOVAL TUN CV CATH W/O FL  08/11/2020   IR THROMBECTOMY AV FISTULA W/THROMBOLYSIS INC/SHUNT/IMG LEFT Left  06/17/2020   IR US GUIDE VASC ACCESS LEFT  06/17/2020   IR US GUIDE VASC ACCESS RIGHT  03/28/2017   IR US GUIDE VASC ACCESS RIGHT  06/17/2020   LEFT HEART CATH AND CORONARY ANGIOGRAPHY N/A 09/20/2016   Procedure: Left Heart Cath and Coronary Angiography;  Surgeon: [name], MD;  Location: [place] CV LAB;  Service: Cardiovascular;  Laterality: N/A;   LEFT HEART CATH AND CORS/GRAFTS ANGIOGRAPHY N/A 10/13/2016   Procedure: Left Heart Cath and Cors/Grafts Angiography;  Surgeon: Troy Sine, MD;  Location: [place] [facility];  Service: Cardiovascular;  Laterality: N/A;   LEFT HEART CATH AND CORS/GRAFTS ANGIOGRAPHY N/A 07/13/2018   Procedure: LEFT HEART CATH AND CORS/GRAFTS ANGIOGRAPHY;  Surgeon: Martinique, Peter M, MD;  Location: [place] [facility];  Service: Cardiovascular;  Laterality: N/A;   LEFT HEART CATHETERIZATION WITH CORONARY ANGIOGRAM N/A 12/15/2011   Procedure:  LEFT HEART CATHETERIZATION WITH CORONARY ANGIOGRAM;  Surgeon: Burnell Blanks, MD;  Location: [facility] CATH LAB;  Service: Cardiovascular;  Laterality: N/A;   LEFT HEART CATHETERIZATION WITH CORONARY ANGIOGRAM N/A 01/10/2012   Procedure: LEFT HEART CATHETERIZATION WITH CORONARY ANGIOGRAM;  Surgeon: Peter M Martinique, MD;  Location: [facility] CATH LAB;  Service: Cardiovascular;  Laterality: N/A;   LEFT HEART CATHETERIZATION WITH CORONARY ANGIOGRAM N/A 06/08/2012   Procedure: LEFT HEART CATHETERIZATION WITH CORONARY ANGIOGRAM;  Surgeon: Burnell Blanks, MD;  Location: [facility] CATH LAB;  Service: Cardiovascular;  Laterality: N/A;   LEFT HEART CATHETERIZATION WITH CORONARY/GRAFT ANGIOGRAM N/A 12/10/2013   Procedure: LEFT HEART CATHETERIZATION WITH Beatrix Fetters;  Surgeon: [name], MD;  Location: [facility] CATH LAB;  Service: Cardiovascular;  Laterality: N/A;   OVARY SURGERY     ovarian cancer   POLYPECTOMY  03/09/2019   Procedure: POLYPECTOMY;  Surgeon: Rogene Houston, MD;  [facility];  Service: Endoscopy;;  cecal     POLYPECTOMY N/A 09/26/2019   Procedure: DUODENAL POLYPECTOMY;  Surgeon: Rogene Houston, MD;  [facility];  Service: Endoscopy;  Laterality: N/A;   REVISION OF ARTERIOVENOUS GORETEX GRAFT N/A 02/24/2017   Procedure: REVISION OF ARTERIOVENOUS GORETEX GRAFT (RESECTION);  Surgeon: Katha Cabal, MD;  Location: [facility];  Service: Vascular;  Laterality: N/A;   REVISON OF ARTERIOVENOUS FISTULA Left 06/19/2020   Procedure: REVISION OF LEFT UPPER ARM AV GRAFT WITH INTERPOSITION JUMP GRAFT USING 6MM GORE LIMB;  Surgeon: Marty Heck, MD;  Location: [place];  Service: Vascular;  Laterality: Left;   SHUNTOGRAM N/A 10/15/2013   Procedure: Fistulogram;  Surgeon: Serafina Mitchell, MD;  Location: [facility] CATH LAB;  Service: Cardiovascular;  Laterality: N/A;   THROMBECTOMY / ARTERIOVENOUS GRAFT REVISION  2011   left upper arm   TUBAL LIGATION  1980's   UPPER EXTREMITY ANGIOGRAPHY Bilateral 12/06/2016   Procedure: Upper Extremity Angiography;  Surgeon: Katha Cabal, MD;  Location: [place] CV LAB;  Service: Cardiovascular;  Laterality: Bilateral;   UPPER EXTREMITY INTERVENTION Left 06/06/2017   Procedure: UPPER EXTREMITY INTERVENTION;  Surgeon: Katha Cabal, MD;  Location: [place] CV LAB;  Service: Cardiovascular;  Laterality: Left;     reports that she has never smoked. She has never used smokeless tobacco. She reports that she does not drink alcohol and does not use drugs.  Allergies  Allergen Reactions   Amlodipine Swelling   Aspirin Other (See Comments)    High Doses Mess up her stomach; "makes my bowels have blood in them". Takes 81 mg EC Aspirin    Nitrofurantoin Hives   Penicillins Other (See Comments)    SYNCOPE? , "makes me real weak when I take it; like I'll pass out"  Has patient had a PCN reaction causing immediate rash, facial/tongue/throat swelling, SOB or lightheadedness with hypotension: Yes Has patient had a PCN reaction causing severe rash involving  mucus membranes or skin necrosis: no Has patient had a PCN reaction that required hospitalization no Has patient had a PCN reaction occurring within the last 10 years: no If all of the above    Bactrim [Sulfamethoxazole-Trimethoprim] Rash   Contrast Media [Iodinated Contrast Media] Itching   Iron Itching and Other (See Comments)    "they gave me iron in dialysis; had to give me Benadryl cause I had to have the iron" (05/02/2012)   Tylenol [Acetaminophen] Itching and Other (See Comments)    Makes her feet on fire per pt  Gabapentin Other (See Comments)    Unknown reaction   Iron Sucrose    No Known Allergies    Ranexa [Ranolazine] Other (See Comments)    Myoclonus-hospitalized    Sucroferric Oxyhydroxide    Clopidogrel Rash   Dexilant [Dexlansoprazole] Other (See Comments)    Upset stomach   Hydralazine Itching    Has tolerated while inpatient Has tolerated while inpatient   Levaquin [Levofloxacin In D5w] Rash   Levofloxacin Rash   Morphine Itching   Morphine And Related Itching and Other (See Comments)    Itching in feet   Pantoprazole Rash   Plavix [Clopidogrel Bisulfate] Rash   Protonix [Pantoprazole Sodium] Rash   Venofer [Ferric Oxide] Itching and Other (See Comments)    Patient reports using Benadryl prior to doses as [place]    Family History  Problem Relation Age of Onset   Heart disease Mother        Heart Disease before age 31   Hyperlipidemia Mother    Hypertension Mother    Diabetes Mother    Heart attack Mother    Heart disease Father        Heart Disease before age 21   Hyperlipidemia Father    Hypertension Father    Diabetes Father    Diabetes Sister    Hypertension Sister    Diabetes Brother    Hyperlipidemia Brother    Heart attack Brother    Hypertension Sister    Heart attack Brother    Colon cancer Child 24   Other Other        noncontributory for early CAD   Esophageal cancer Neg Hx    Liver disease Neg Hx    Kidney disease Neg  Hx    Colon polyps Neg Hx     Prior to Admission medications   Medication Sig Start Date End Date Taking? Authorizing Provider  ALPRAZolam Duanne Moron) 0.5 MG tablet Take 0.5 mg by mouth at bedtime. 03/20/21   [provider]  amiodarone (PACERONE) 200 MG tablet Take 1 tablet (200 mg total) by mouth daily. 05/13/21   Strader, Fransisco Hertz, PA-C  aspirin EC 81 MG tablet Take 81 mg by mouth daily.  09/27/19   Rehman, Mechele Dawley, MD  fluticasone (FLONASE) 50 MCG/ACT nasal spray Place 1 spray into both nostrils at bedtime as needed for allergies.     [provider]  hydrOXYzine (VISTARIL) 100 MG capsule Take 100 mg by mouth daily as needed for anxiety. 11/23/20   [provider]  isosorbide mononitrate (IMDUR) 30 MG 24 hr tablet Take 1 tablet (30 mg total) by mouth daily. 07/08/21   Shelly Coss, MD  levothyroxine (SYNTHROID) 25 MCG tablet Take 1 tablet (25 mcg total) by mouth daily. 02/02/21   Brita Romp, NP  lidocaine-prilocaine (EMLA) cream Apply 1 application topically every Monday, Wednesday, and Friday. Prior to dialysis    [provider]  loperamide (IMODIUM A-D) 2 MG tablet Take 2 tablets (4 mg total) by mouth daily as needed for diarrhea or loose stools. 12/17/20   Thurnell Lose, MD  metoprolol tartrate (LOPRESSOR) 25 MG tablet Take 0.5 tablets (12.5 mg total) by mouth 2 (two) times daily. 07/08/21   Shelly Coss, MD  midodrine (PROAMATINE) 5 MG tablet Take Monday, Wednesday, Friday morning BEFORE dialysis 12/22/20   Arnoldo Lenis, MD  multivitamin (RENA-VIT) TABS tablet Take 1 tablet by mouth daily.    [provider]  Na Sulfate-K Sulfate-Mg Sulf (SUPREP  BOWEL PREP KIT) 17.5-3.13-1.6 GM/177ML SOLN Take 1 kit by mouth as directed. 06/11/21   Harvel Quale, MD  nitroGLYCERIN (NITROSTAT) 0.4 MG SL tablet Place 1 tablet (0.4 mg total) under the tongue every 5 (five) minutes x 3 doses as needed for chest pain (if no relief after 2nd  dose, proceed to the ED for an evaluation or call 911). 02/19/21   Verta Ellen., NP  omeprazole (PRILOSEC) 20 MG capsule Take 1 capsule (20 mg total) by mouth daily. 12/01/20   Rehman, Mechele Dawley, MD  rosuvastatin (CRESTOR) 10 MG tablet Take 1 tablet (10 mg total) by mouth daily. 05/13/21   Strader, Fransisco Hertz, PA-C  sevelamer carbonate (RENVELA) 800 MG tablet Take 3 tablets (2,400 mg total) by mouth 3 (three) times daily with meals. 07/08/21   Shelly Coss, MD    Physical Exam: Vitals:   07/16/21 0412 07/16/21 0452 07/16/21 0500 07/16/21 0530  BP: 105/90  (!) 145/61 (!) 125/47  Pulse: 87 81 81 75  Resp: (!) 22 (!) 21 (!) 24 20  Temp:      TempSrc:      SpO2: 100% 100% 100% 100%  Weight:      Height:        Constitutional: pt on bipap, NAD, calm, comfortable Eyes: PERRL, lids and conjunctivae normal ENMT: on bipap therapy.    Neck: normal, supple, no masses, no thyromegaly Respiratory: bibasilar crackles heard.   Cardiovascular: normal s1, s2 sounds, no murmurs / rubs / gallops. No extremity edema. 2+ pedal pulses. No carotid bruits.  Abdomen: no tenderness, no masses palpated. No hepatosplenomegaly. Bowel sounds positive.  Musculoskeletal: no clubbing / cyanosis. No joint deformity upper and lower extremities. Good ROM, no contractures. Normal muscle tone.  Skin: no rashes, lesions, ulcers. No induration Neurologic: CN 2-12 grossly intact. Sensation intact, DTR normal. Strength 5/5 in all 4.  Psychiatric: poor judgment and insight. Alert and oriented x 3. Normal mood.   Labs on Admission: I have personally reviewed following labs and imaging studies  CBC: Recent Labs  Lab 07/15/21 2336  WBC 9.0  HGB 8.1*  HCT 26.1*  MCV 127.3*  PLT PLATELET CLUMPS NOTED ON SMEAR, COUNT APPEARS DECREASED   Basic Metabolic Panel: Recent Labs  Lab 07/15/21 2336  NA 139  K 4.8  CL 101  CO2 24  GLUCOSE 154*  BUN 36*  CREATININE 6.22*  CALCIUM 9.1   GFR: Estimated Creatinine  Clearance: 5.4 mL/min (A) (by C-G formula based on SCr of 6.22 mg/dL (H)). Liver Function Tests: No results for input(s): AST, ALT, ALKPHOS, BILITOT, PROT, ALBUMIN in the last 168 hours. No results for input(s): LIPASE, AMYLASE in the last 168 hours. No results for input(s): AMMONIA in the last 168 hours. Coagulation Profile: No results for input(s): INR, PROTIME in the last 168 hours. Cardiac Enzymes: No results for input(s): CKTOTAL, CKMB, CKMBINDEX, TROPONINI in the last 168 hours. BNP (last 3 results) No results for input(s): PROBNP in the last 8760 hours. HbA1C: No results for input(s): HGBA1C in the last 72 hours. CBG: No results for input(s): GLUCAP in the last 168 hours. Lipid Profile: No results for input(s): CHOL, HDL, LDLCALC, TRIG, CHOLHDL, LDLDIRECT in the last 72 hours. Thyroid Function Tests: No results for input(s): TSH, T4TOTAL, FREET4, T3FREE, THYROIDAB in the last 72 hours. Anemia Panel: No results for input(s): VITAMINB12, FOLATE, FERRITIN, TIBC, IRON, RETICCTPCT in the last 72 hours. Urine analysis:    Component Value Date/Time   COLORURINE  YELLOW 12/16/2012 1919   APPEARANCEUR CLOUDY (A) 12/16/2012 1919   LABSPEC 1.009 12/16/2012 1919   PHURINE 7.5 12/16/2012 1919   GLUCOSEU NEGATIVE 12/16/2012 1919   HGBUR TRACE (A) 12/16/2012 1919   BILIRUBINUR NEGATIVE 12/16/2012 1919   KETONESUR NEGATIVE 12/16/2012 1919   PROTEINUR 100 (A) 12/16/2012 1919   UROBILINOGEN 0.2 12/16/2012 1919   NITRITE NEGATIVE 12/16/2012 1919   LEUKOCYTESUR SMALL (A) 12/16/2012 1919    Radiological Exams on Admission: DG Chest 2 View  Result Date: 07/15/2021 CLINICAL DATA:  Central chest pain. EXAM: CHEST - 2 VIEW COMPARISON:  July 06, 2021 FINDINGS: Multiple sternal wires and vascular clips are seen. Mild, stable, diffusely increased lung markings are noted. Mild atelectasis is seen within the bilateral lung bases. Small bilateral pleural effusions are present. No pneumothorax is  identified. There is mild to moderate severity enlargement of the cardiac silhouette. This is increased in severity when compared to the prior study. A coronary artery stent is noted. A radiopaque vascular stent is seen within the soft tissues of the proximal left upper extremity. Multilevel degenerative changes seen throughout the thoracic spine. IMPRESSION: 1. Evidence of prior median sternotomy/CABG. 2. Mild to moderate severity enlargement of the cardiac silhouette which represents a new finding when compared to the exam. Interval development of a pericardial effusion cannot be excluded. Correlation with chest CT is recommended. 3. Mild bibasilar atelectasis. 4. Small bilateral pleural effusions. Electronically Signed   By: Virgina Norfolk M.D.   On: 07/15/2021 23:33   CT Chest Wo Contrast  Result Date: 07/16/2021 CLINICAL DATA:  81 year old female with chest pain. Pleural effusion. EXAM: CT CHEST WITHOUT CONTRAST TECHNIQUE: Multidetector CT imaging of the chest was performed following the standard protocol without IV contrast. COMPARISON:  Chest radiographs 07/15/2021. [name] Noncontrast CT 02/26/2018. CT Abdomen and Pelvis 04/06/2020. FINDINGS: Cardiovascular: Prior CABG. Extensive calcified coronary artery and Calcified aortic atherosclerosis. Mild cardiomegaly. No pericardial effusion. Vascular patency is not evaluated in the absence of IV contrast. Mediastinum/Nodes: Stable. Upper limits of normal to mildly reactive appearing mediastinal lymph nodes have not significantly changed since 2019. Lungs/Pleura: Small layering pleural effusions, trace on the left. Major airways are patent. However, there is pronounced bilateral lower lobe and right middle lobe bronchial wall thickening. Somewhat generalized increased pulmonary septal thickening also, and occasional areas of vague peribronchial ground-glass opacity (left lung series 4, image 42 right lung image 54). Superimposed chronic lung  scarring. Confluent opacity in the costophrenic angles indeterminate for atelectasis versus infection. Upper Abdomen: Severe Calcified aortic atherosclerosis. Bilateral renal atrophy. Diverticulosis of the visible large bowel. Negative visible noncontrast liver, spleen, pancreas and stomach. Musculoskeletal: Prior sternotomy. Exaggerated thoracic kyphosis. Widespread advanced thoracic disc degeneration. Mild L1 superior endplate compression is new since 2019 but was probably acute last year. No acute osseous abnormality identified. IMPRESSION: 1. Severe Aortic Atherosclerosis (ICD10-I70.0) with prior CABG. Mild cardiomegaly. 2. Small layering pleural effusions (trace on the left) with generalized pulmonary septal thickening and vague asymmetric pulmonary ground-glass opacity new from 2019. Suspect pulmonary interstitial edema, however, there is also new bilateral lower lobe and right middle lobe bronchial wall thickening with confluent lung opacity in both costophrenic angles - suspicious for superimposed acute infection. Alternatively, the entire constellation might reflect a viral respiratory illness. 3. Mild L1 superior endplate compression fracture is new since 2019 but was probably chronic. Electronically Signed   By: Genevie Ann M.D.   On: 07/16/2021 05:22    EKG: Independently reviewed. NSR  Assessment/Plan Principal  Problem:   Ischemic chest pain (HCC) Active Problems:   Atherosclerotic heart disease of native coronary artery without angina pectoris   Essential hypertension, benign   ESRD on hemodialysis (HCC)   HLD (hyperlipidemia)   GERD (gastroesophageal reflux disease)   AVM (arteriovenous malformation) of small bowel, acquired   Anemia in chronic kidney disease   Hx of CABG   Carotid artery disease (HCC)   Unstable angina (HCC)    Chest pain recurrent with history of unstable angina - possibly exacerbated by recent decrease in imdur dose to 30 mg - also appears to be volume overloaded  from missed HD with pulmonary infection - slight uptrend in HS troponin, will check now  - EKG reassuring at this time - with high risk and known CAD, will ask for cardiology consult - resumed home statin, aspirin, imdur   ESRD on HD - reportedly missed last HD treatment - consult to nephrology for inpatient HD treatment, volume removal - resume home medication - pt does take midodrine 5 mg prior to HD treatment  Anemia in CKD - Hg stable from recent testing, follow closely - transfuse for Hg<8 given severe CAD  Acute respiratory distress - secondary to volume overload and pulmonary infection - BIPAP PRN  - supplemental oxygen as needed - Hemodialysis for volume removal  - antibiotics for pulmonary infection  URI  - likely is viral but given advanced comorbidities empirically cover with antibiotics - check procalcitonin - follow CXR - start doxycycline 100 mg BID 1/7  Hyperlipidemia - resume home rosuvastatin 10 mg daily  H/o Paroxysmal Atrial fibrillation - not anticoagulated due to GI bleeding - maintained on amiodarone and metoprolol for rate control  Chronic diastolic heart failure  - ESRD for volume removal  - consulted nephrology service    DVT prophylaxis: SQ heparin   Code Status: Full   Family Communication: bedside update    Disposition Plan: TBD   Consults called: cardiology, nephrology   Admission status: OBV  Level of care: Stepdown Irwin Brakeman MD [facility] How to contact the [facility] Attending or Consulting provider 7A - 7P or covering provider during after hours 7P -7A, for this patient?  Check the care team in [facility] and look for a) attending/consulting TR[facility] provider listed and b) the [facility] Main team listed Log into [email or web address] and use [place]'s universal password to access. If you do not have the password, please contact the hospital operator. Locate the [facility] provider you are looking for under [facility] and page to a number that you  can be directly reached. If you still have difficulty reaching the provider, please page the [facility] (Director on Call) for the Hospitalists listed on amion for assistance.   If 7PM-7AM, please contact night-coverage [email or web address] Password [facility]  07/16/2021, 7:24 AM

## 2021-07-16 NOTE — Progress Notes (Signed)
Date and time results received: 07/16/21 2014   Test: Troponin Critical Value: 356  Name of Provider Notified: Zierle-Ghosh  Orders Received? Or Actions Taken?: No new orders at this time.

## 2021-07-16 NOTE — Progress Notes (Signed)
ANTICOAGULATION CONSULT NOTE - Initial Consult  Pharmacy Consult for heparin Indication: chest pain/ACS  Allergies  Allergen Reactions   Amlodipine Swelling   Aspirin Other (See Comments)    High Doses Mess up her stomach; "makes my bowels have blood in them". Takes 81 mg EC Aspirin    Nitrofurantoin Hives   Penicillins Other (See Comments)    SYNCOPE? , "makes me real weak when I take it; like I'll pass out"  Has patient had a PCN reaction causing immediate rash, facial/tongue/throat swelling, SOB or lightheadedness with hypotension: Yes Has patient had a PCN reaction causing severe rash involving mucus membranes or skin necrosis: no Has patient had a PCN reaction that required hospitalization no Has patient had a PCN reaction occurring within the last 10 years: no If all of the above    Bactrim [Sulfamethoxazole-Trimethoprim] Rash   Contrast Media [Iodinated Contrast Media] Itching   Iron Itching and Other (See Comments)    "they gave me iron in dialysis; had to give me Benadryl cause I had to have the iron" (05/02/2012)   Tylenol [Acetaminophen] Itching and Other (See Comments)    Makes her feet on fire per pt   Gabapentin Other (See Comments)    Unknown reaction   Iron Sucrose    No Known Allergies    Ranexa [Ranolazine] Other (See Comments)    Myoclonus-hospitalized    Sucroferric Oxyhydroxide    Clopidogrel Rash   Dexilant [Dexlansoprazole] Other (See Comments)    Upset stomach   Hydralazine Itching    Has tolerated while inpatient Has tolerated while inpatient   Levaquin [Levofloxacin In D5w] Rash   Levofloxacin Rash   Morphine Itching   Morphine And Related Itching and Other (See Comments)    Itching in feet   Pantoprazole Rash   Plavix [Clopidogrel Bisulfate] Rash   Protonix [Pantoprazole Sodium] Rash   Venofer [Ferric Oxide] Itching and Other (See Comments)    Patient reports using Benadryl prior to doses as [place]    Patient  Measurements: Height: 5\' 1"  (154.9 cm) Weight: 54 kg (119 lb) IBW/kg (Calculated) : 47.8 HEPARIN DW (KG): 54   Vital Signs: Temp: 98.7 F (37.1 C) (01/05 2252) Temp Source: Oral (01/05 2252) BP: 125/64 (01/06 0900) Pulse Rate: 71 (01/06 0900)  Labs: Recent Labs    07/15/21 2336 07/16/21 0151 07/16/21 0756  HGB 8.1*  --   --   HCT 26.1*  --   --   PLT PLATELET CLUMPS NOTED ON SMEAR, COUNT APPEARS DECREASED  --   --   CREATININE 6.22*  --   --   TROPONINIHS 48* 60* 237*    Estimated Creatinine Clearance: 5.4 mL/min (A) (by C-G formula based on SCr of 6.22 mg/dL (H)).   Medical History: Past Medical History:  Diagnosis Date   Acute on chronic respiratory failure with hypoxia ([place]) 10/10/2016   Anxiety    Arthritis    AVM (arteriovenous malformation) of colon    CAD (coronary artery disease)    a. s/p CABG in 2013 b. DES to D1 in 10/2016. c. cath in 07/2018 showing patent grafts with occlusion of D1 at prior stent site and progression of PDA disease --> medical management recommended   Carotid artery disease ([place])    a. 34-74% LICA, 08/5954    Chronic anemia    Chronic bronchitis (HCC)    Chronic diastolic CHF (congestive heart failure) ([place])    a. 02/2012 Echo EF 60-65%, nl wall motion, Gr  1 DD, mod MR   Colon cancer ([place]) 1992   Esophageal stricture    ESRD on hemodialysis ([place])    ESRD due to HTN, started dialysis 2011 and gets HD at [facility] with Dr Hinda Lenis on MWF schedule.  Access is LUA AVF as of Sept 2014.    GERD (gastroesophageal reflux disease)    High cholesterol 12/2011   History of blood transfusion 07/2011; 12/2011; 01/2012 X 2; 04/2012   History of gout    History of lower GI bleeding    Hypertension    Iron deficiency anemia    Jugular vein occlusion, right (HCC)    Mitral regurgitation    a. Moderate by echo, 02/2012   Myocardial infarction [facility])    NSVT (nonsustained ventricular tachycardia)    Ovarian cancer ([place]) 1992   PAF (paroxysmal atrial  fibrillation) ([place])    Pneumonia ~ 2009   PUD (peptic ulcer disease)    TIA (transient ischemic attack)     Medications:  See med rec  Assessment: Patient presented with chest pain. Troponins are escalating. Not on oral anticoagulants. Pharmacy asked to start heparin  Goal of Therapy:  Heparin level 0.3-0.7 units/ml Monitor platelets by anticoagulation protocol: Yes   Plan:  Give 3250 units bolus x 1 Start heparin infusion at 650 units/hr Check anti-Xa level in ~8 hours and daily while on heparin Continue to monitor H&H and platelets  Isac Sarna, BS Vena [place], BCPS Clinical Pharmacist Pager [phone]  Trenton Gammon, Kemoni Quesenberry L 07/16/2021,9:50 AM

## 2021-07-16 NOTE — Progress Notes (Signed)
Pt transferred to ct on BIPAP and back with no problems attempted to take patient off to see if she can tolerate being on 3lpm but patient started becoming sob patient place back on BIPAP

## 2021-07-16 NOTE — Progress Notes (Signed)
ANTICOAGULATION CONSULT NOTE - Follow Up Consult  Pharmacy Consult for Heparin Indication: chest pain/ACS  Allergies  Allergen Reactions   Amlodipine Swelling   Aspirin Other (See Comments)    High Doses Mess up her stomach; "makes my bowels have blood in them". Takes 81 mg EC Aspirin    Nitrofurantoin Hives   Penicillins Other (See Comments)    SYNCOPE? , "makes me real weak when I take it; like I'll pass out"  Has patient had a PCN reaction causing immediate rash, facial/tongue/throat swelling, SOB or lightheadedness with hypotension: Yes Has patient had a PCN reaction causing severe rash involving mucus membranes or skin necrosis: no Has patient had a PCN reaction that required hospitalization no Has patient had a PCN reaction occurring within the last 10 years: no If all of the above    Bactrim [Sulfamethoxazole-Trimethoprim] Rash   Contrast Media [Iodinated Contrast Media] Itching   Iron Itching and Other (See Comments)    "they gave me iron in dialysis; had to give me Benadryl cause I had to have the iron" (05/02/2012)   Tylenol [Acetaminophen] Itching and Other (See Comments)    Makes her feet on fire per pt   Gabapentin Other (See Comments)    Unknown reaction   Iron Sucrose    No Known Allergies    Ranexa [Ranolazine] Other (See Comments)    Myoclonus-hospitalized    Sucroferric Oxyhydroxide    Clopidogrel Rash   Dexilant [Dexlansoprazole] Other (See Comments)    Upset stomach   Hydralazine Itching    Has tolerated while inpatient Has tolerated while inpatient   Levaquin [Levofloxacin In D5w] Rash   Levofloxacin Rash   Morphine Itching   Morphine And Related Itching and Other (See Comments)    Itching in feet   Pantoprazole Rash   Plavix [Clopidogrel Bisulfate] Rash   Protonix [Pantoprazole Sodium] Rash   Venofer [Ferric Oxide] Itching and Other (See Comments)    Patient reports using Benadryl prior to doses as [name]    Patient  Measurements: Height: 5\' 1"  (154.9 cm) Weight: 56.8 kg (125 lb 3.5 oz) IBW/kg (Calculated) : 47.8 Heparin Dosing Weight:    Vital Signs: Temp: 98.3 F (36.8 C) (01/06 1926) Temp Source: Oral (01/06 1926) BP: 120/77 (01/06 1840) Pulse Rate: 74 (01/06 1840)  Labs: Recent Labs    07/15/21 2336 07/16/21 0151 07/16/21 0756 07/16/21 0933 07/16/21 1436 07/16/21 1901  HGB 8.1*  --   --   --   --   --   HCT 26.1*  --   --   --   --   --   PLT PLATELET CLUMPS NOTED ON SMEAR, COUNT APPEARS DECREASED  --   --   --   --   --   HEPARINUNFRC  --   --   --   --   --  0.46  CREATININE 6.22*  --   --   --   --   --   TROPONINIHS 48*   < > 237* 313* 449*  --    < > = values in this interval not displayed.    Estimated Creatinine Clearance: 5.4 mL/min (A) (by C-G formula based on SCr of 6.22 mg/dL (H)).  Assessment:  Anticoag: Recurrent CP (h/o afib, no anticoag PTA) with + troponins. HL 0.46 in goal. No heparin ordered during HD.  Goal of Therapy:  Heparin level 0.3-0.7 units/ml Monitor platelets by anticoagulation protocol: Yes   Plan:  Con't heparin infusion  at 650 units/hr Continue to monitor H&H and platelets and HL daily   Shawna Amini S. Shawna [name], PharmD, BCPS Clinical Staff Pharmacist [email or web address] Shawna [name], Shawna [name] 07/16/2021,8:01 PM

## 2021-07-16 NOTE — Progress Notes (Signed)
*  PRELIMINARY RESULTS* Echocardiogram 2D Echocardiogram has been performed.  Elpidio Anis 07/16/2021, 11:26 AM

## 2021-07-16 NOTE — Procedures (Addendum)
° °  HEMODIALYSIS TREATMENT NOTE:   3.5 hour treatment completed using left upper arm AVG (16g ante/retrograde). Goal met: 2 liters removed without interruption in UF.  Hemodynamically stable with HR in 60s / NSR throughout session. All blood was returned.   About halfway through her treatment she developed some twitching, jerking movements of her upper body. We have seen this during previous hospitalizations but she was concerned because the involuntary movement was not present prior to starting today's HD session.  She asked what medications she was given in the ED that could have caused it.  All meds were reviewed and I tried to reassure her that I've seen her do this before but she avers that it is a new condition.  She was seen by Dr. Wynetta Emery during HD.  BCx2 were collected.  Above was also discussed with Dr. Royce Macadamia who reviewed pt's labs and [facility].  Please refer to  neurology consults and imaging r/t involuntary jerking movements from May and February of last year.   Rockwell Alexandria, RN

## 2021-07-16 NOTE — Progress Notes (Incomplete)
Patient seen and disucssed with PA Ahmed Prima, I agree with her documentation above. 81 yo female history of CAD with CABG in 2013 and DES to D1 in 10/2016. Most recent cath Jan 2020 with patent grafts and occluded D1 at stent site, medical management was recommended. History of chronic diastolic HF, HTN, HL, PAF non on anticoag due to GI bleed, NSVT, ESRD, hypothyroidism.   She has had chronic stable chest pains symptoms for quite some time. Antianginal therpay has been limited due to low bp's and side effects. We did increase her imdur to 90mg  daily at our 06/22/21 visit. Admission 07/07/21 where Dr Johney Frame reviewed prior cath films with interventionl cardiology  "Reviewed 2020 cath films with interventional Cardiology which showed patent bypasses and small PDA lesion. After review of films, would not go after PDA lesion as very small territory"  Presents with chest pain. On my history she reports aching pain started right chest crosses into left chest. Not positional. Consistent with her chronic symptoms but more intense. Typically pain improved with NG but did not this time and came to ER.   Admit labs K 4.8 Cr 6.22 BUN 36 WBC 9 Hgb 8.1 Plt clumps Trop 48-->60-->237--> COVID neg, flu neg CXR cardiogemegaly CT chest: small bilateral effusions, probable edema, possible pneumonia EKG chronic LBBB   03/2021 echo LVEF 50%, grade II dd   History of CAD as reported above with chronic chest pain. Presents with chest pain in setting of missing HD as well as possible respiratory infection. She denies missing an HD session to me but reports recent sessions have had incomplete fluid removal. She also had episode of aflutter with RVR which would increase demand. EKG is chronic LBBB, uptrending trop to 237 difficult to interpret in ESRD patient volume overloaded with possible infection but also known history of CAD. Would follow trend, f/u echo. Follow symptoms as her fluid status improves and treated for  respiratory infection. Considerations for ischemic testing would depend on additional findigns and symptoms going forward this admission.   Medical therapy with ASA 81, crestor 10, lopressor 12.5mg  bid. Would start hep gtt until troponin trend is established.   Also with aflutter episode here, she is on amio at home and lopressor 12.5mg  bid. Monitor at this time, if significant recurrences can reload with amio.  Carlyle Dolly MD

## 2021-07-16 NOTE — Consult Note (Addendum)
Cardiology Consultation:   Patient ID: Shawna [name] MRN: [id number]; DOB: 1941/05/07  Admit date: 07/15/2021 Date of Consult: 07/16/2021  PCP:  [facility] [place] [facility] [facility] Cardiologist:  Carlyle Dolly, MD        Patient Profile:   Shawna [name] is a 81 y.o. female with a past medical history of CAD (s/p CABG in 2013, DES to D1 in 10/2016, cath in 07/2018 showing patent grafts with occlusion of D1 at prior stent site and progression of PDA disease --> medical management recommended), HFpEF, HTN, HLD, paroxysmal atrial fibrillation (not on anticoagulation given history of GIB), NSVT, chronic anemia and ESRD who is being seen 07/16/2021 for the evaluation of atrial flutter at the request of Dr. Wynetta Emery.  History of Present Illness:   Shawna [name] recently admitted to [facility] from 12/27 - 07/08/2021 for evaluation of chest pain. Hs Troponin enzymes were flat and peaked at 52 with EKG showing no acute ischemic changes. By review of notes, Dr. Johney Frame reviewed her case with the Interventional Team and it was not recommended to pursue PCI on her previously noted PDA lesion as it supplied a small territory and given her poor response to stenting in the past with all prior stents occluded, medical management was recommended and if failed, could then consider repeat cath. Imdur was increased to 193m BID but by review of the Discharge Summary, she was placed on 342mdaily (?).   [facility] evaluation of chest pain which started an hour prior to arrival. She reports having palpitations and tachycardia while at HD on Wednesday but did not have pain at that time. Did have chest pain later in the day and that night which resolved with SL NTG. Last night she had chest pain which radiated into her left arm. Pain lasted for over an hour and did not improve with 2 SL NTG, therefore she came to the ED. She has been tachycardiac while in the ED with HR in  the 140's. Reports palpitations at that time as well. She has baseline dyspnea on exertion but has also noticed a productive cough over the past few days as well. Has experienced worsening lower extremity edema. She has only been taking Imdur 3015maily since this was listed on her recent AVS.    Initial labs show WBC 9.0, Hgb 8.1 (close to baseline), platelets not able to be recorded due to clumping. BMET showing Na+ 139, K+ 4.8 and creatinine 6.22. Initial Hs Troponin at 60 with repeat of 237. Procalcitonin 0.12.FLP showing total cholesterol of 131, HDL 60 and LDL 53. Negative for COVID and Influenza. EKG shows atrial flutter vs. SVT, HR 144 with underlying LBBB. CXR showing mild to moderate enlargement of the cardiac silhouette and cannot exclude effusion. Chest CT showed severe aortic atherosclerosis and small layering effusions.   Initially required BiPAP but now weaned down to nasal cannula.    Past Medical History:  Diagnosis Date   Acute on chronic respiratory failure with hypoxia (HCCDescanso4/08/2016   Anxiety    Arthritis    AVM (arteriovenous malformation) of colon    CAD (coronary artery disease)    a. s/p CABG in 2013 b. DES to D1 in 10/2016. c. cath in 07/2018 showing patent grafts with occlusion of D1 at prior stent site and progression of PDA disease --> medical management recommended   Carotid artery disease (HCCWindom  a. 60-25-42%CA, 04/06/622  Chronic anemia    Chronic bronchitis (HCC)    Chronic diastolic CHF (congestive heart failure) ([place])    a. 02/2012 Echo EF 60-65%, nl wall motion, Gr 1 DD, mod MR   Colon cancer ([place]) 1992   Esophageal stricture    ESRD on hemodialysis ([place])    ESRD due to HTN, started dialysis 2011 and gets HD at [facility] with Dr Hinda Lenis on MWF schedule.  Access is LUA AVF as of Sept 2014.    GERD (gastroesophageal reflux disease)    High cholesterol 12/2011   History of blood transfusion 07/2011; 12/2011; 01/2012 X 2; 04/2012   History of gout     History of lower GI bleeding    Hypertension    Iron deficiency anemia    Jugular vein occlusion, right (HCC)    Mitral regurgitation    a. Moderate by echo, 02/2012   Myocardial infarction [facility])    NSVT (nonsustained ventricular tachycardia)    Ovarian cancer ([place]) 1992   PAF (paroxysmal atrial fibrillation) ([place])    Pneumonia ~ 2009   PUD (peptic ulcer disease)    TIA (transient ischemic attack)     Past Surgical History:  Procedure Laterality Date   A/V SHUNTOGRAM Left 03/19/2019   Procedure: A/V SHUNTOGRAM;  Surgeon: Katha Cabal, MD;  Location: [place] [facility];  Service: Cardiovascular;  Laterality: Left;   ABDOMINAL HYSTERECTOMY  1992   APPENDECTOMY  06/1990   AV FISTULA PLACEMENT  07/2009   left upper arm   AV FISTULA PLACEMENT Right 09/06/2016   Procedure: RIGHT FOREARM ARTERIOVENOUS (AV) GRAFT;  Surgeon: Elam Dutch, MD;  Location: [place];  Service: Vascular;  Laterality: Right;   AV FISTULA PLACEMENT N/A 02/24/2017   Procedure: INSERTION OF ARTERIOVENOUS (AV) GORE-TEX GRAFT ARM (BRACHIAL AXILLARY);  Surgeon: Katha Cabal, MD;  Location: [facility] ORS;  Service: Vascular;  Laterality: N/A;   [place] Right 09/06/2016   Procedure: REMOVAL OF Right Arm ARTERIOVENOUS GORETEX GRAFT and Vein Patch angioplasty of brachial artery;  Surgeon: Angelia Mould, MD;  Location: [place];  Service: Vascular;  Laterality: Right;   BIOPSY  09/26/2019   Procedure: BIOPSY;  Surgeon: Rogene Houston, MD;  [facility];  Service: Endoscopy;;   COLON RESECTION  1992   COLON SURGERY     COLONOSCOPY N/A 03/09/2019   Procedure: COLONOSCOPY;  Surgeon: Rogene Houston, MD;  [facility];  Service: Endoscopy;  Laterality: N/A;   COLONOSCOPY WITH PROPOFOL N/A 06/08/2021   Procedure: COLONOSCOPY WITH PROPOFOL;  Surgeon: Harvel Quale, MD;  [facility];  Service: Gastroenterology;  Laterality: N/A;  9:05 /Patient is on dialysis Mon Wed Fri    CORONARY ANGIOPLASTY WITH STENT PLACEMENT  12/15/11   "2"   CORONARY ANGIOPLASTY WITH STENT PLACEMENT  y/2013   "1; makes total of 3" (05/02/2012)   CORONARY ARTERY BYPASS GRAFT  06/13/2012   Procedure: CORONARY ARTERY BYPASS GRAFTING (CABG);  Surgeon: Grace Isaac, MD;  Location: [place];  Service: Open Heart Surgery;  Laterality: N/A;  cabg x four;  using left internal mammary artery, and left leg greater saphenous vein harvested endoscopically   CORONARY STENT INTERVENTION N/A 10/13/2016   Procedure: Coronary Stent Intervention;  Surgeon: [place] Sine, MD;  Location: [name] [facility];  Service: Cardiovascular;  Laterality: N/A;   DIALYSIS/PERMA CATHETER REMOVAL N/A 04/18/2017   Procedure: DIALYSIS/PERMA CATHETER REMOVAL;  Surgeon: Katha Cabal, MD;  Location: [facility]  INVASIVE CV LAB;  Service: Cardiovascular;  Laterality: N/A;   DILATION AND CURETTAGE OF UTERUS     ENTEROSCOPY N/A 06/08/2021   Procedure: PUSH ENTEROSCOPY;  Surgeon: Harvel Quale, MD;  [facility];  Service: Gastroenterology;  Laterality: N/A;   ESOPHAGOGASTRODUODENOSCOPY  01/20/2012   Procedure: ESOPHAGOGASTRODUODENOSCOPY (EGD);  Surgeon: Ladene Artist, MD,FACG;  Location: [facility] ENDOSCOPY;  Service: Endoscopy;  Laterality: N/A;   ESOPHAGOGASTRODUODENOSCOPY N/A 03/26/2013   Procedure: ESOPHAGOGASTRODUODENOSCOPY (EGD);  Surgeon: Irene Shipper, MD;  Location[facility] ENDOSCOPY;  Service: Endoscopy;  Laterality: N/A;   ESOPHAGOGASTRODUODENOSCOPY N/A 04/30/2015   Procedure: ESOPHAGOGASTRODUODENOSCOPY (EGD);  Surgeon: Rogene Houston, MD;  [facility];  Service: Endoscopy;  Laterality: N/A;  1pm - moved to 10/20 @ 1:10   ESOPHAGOGASTRODUODENOSCOPY N/A 07/29/2016   Procedure: ESOPHAGOGASTRODUODENOSCOPY (EGD);  Surgeon: Manus Gunning, MD;  Location: [place];  Service: Gastroenterology;  Laterality: N/A;  enteroscopy   ESOPHAGOGASTRODUODENOSCOPY N/A 09/26/2019   Procedure:  ESOPHAGOGASTRODUODENOSCOPY (EGD);  Surgeon: Rogene Houston, MD;  [facility];  Service: Endoscopy;  Laterality: N/A;  1250   ESOPHAGOGASTRODUODENOSCOPY (EGD) WITH PROPOFOL N/A 02/05/2021   Procedure: ESOPHAGOGASTRODUODENOSCOPY (EGD) WITH PROPOFOL;  Surgeon: Eloise Harman, DO;  [facility];  Service: Endoscopy;  Laterality: N/A;   GIVENS CAPSULE STUDY N/A 03/07/2019   Procedure: GIVENS CAPSULE STUDY;  Surgeon: Rogene Houston, MD;  [facility];  Service: Endoscopy;  Laterality: N/A;  7:30   GIVENS CAPSULE STUDY N/A 04/22/2021   Procedure: GIVENS CAPSULE STUDY;  Surgeon: Rogene Houston, MD;  [facility];  Service: Endoscopy;  Laterality: N/A;  7:30   INSERTION OF DIALYSIS CATHETER N/A 10/05/2020   Procedure: ABORTED TUNNELED DIALYSIS CATHETER PLACEMENT RIGHT INTERNAL JUGULAR VEIN ;  Surgeon: Virl Cagey, MD;  [facility];  Service: General;  Laterality: N/A;   INTRAOPERATIVE TRANSESOPHAGEAL ECHOCARDIOGRAM  06/13/2012   Procedure: INTRAOPERATIVE TRANSESOPHAGEAL ECHOCARDIOGRAM;  Surgeon: Grace Isaac, MD;  Location: [place];  Service: Open Heart Surgery;  Laterality: N/A;   IR DIALY SHUNT INTRO NEEDLE/INTRACATH INITIAL W/IMG LEFT Left 10/06/2020   IR FLUORO GUIDE CV LINE RIGHT  06/17/2020   IR GENERIC HISTORICAL  07/26/2016   IR FLUORO GUIDE CV LINE RIGHT 07/26/2016 Greggory Keen, MD [facility]   IR GENERIC HISTORICAL  07/26/2016   IR US GUIDE VASC ACCESS RIGHT 07/26/2016 Greggory Keen, MD [facility]   IR GENERIC HISTORICAL  08/02/2016   IR US GUIDE VASC ACCESS RIGHT 08/02/2016 Greggory Keen, MD [facility]   IR GENERIC HISTORICAL  08/02/2016   IR FLUORO GUIDE CV LINE RIGHT 08/02/2016 Greggory Keen, MD [facility]   IR RADIOLOGY PERIPHERAL GUIDED IV START  03/28/2017   IR REMOVAL TUN CV CATH W/O FL  08/11/2020   IR THROMBECTOMY AV FISTULA W/THROMBOLYSIS INC/SHUNT/IMG LEFT Left 06/17/2020   IR US GUIDE VASC ACCESS LEFT  06/17/2020    IR US GUIDE VASC ACCESS RIGHT  03/28/2017   IR US GUIDE VASC ACCESS RIGHT  06/17/2020   LEFT HEART CATH AND CORONARY ANGIOGRAPHY N/A 09/20/2016   Procedure: Left Heart Cath and Coronary Angiography;  Surgeon: [name], MD;  Location: [place] [facility];  Service: Cardiovascular;  Laterality: N/A;   LEFT HEART CATH AND CORS/GRAFTS ANGIOGRAPHY N/A 10/13/2016   Procedure: Left Heart Cath and Cors/Grafts Angiography;  Surgeon: [place] Sine, MD;  Location: [place] [facility];  Service: Cardiovascular;  Laterality: N/A;   LEFT  HEART CATH AND CORS/GRAFTS ANGIOGRAPHY N/A 07/13/2018   Procedure: LEFT HEART CATH AND CORS/GRAFTS ANGIOGRAPHY;  Surgeon: Martinique, Peter M, MD;  Location: [place] [facility];  Service: Cardiovascular;  Laterality: N/A;   LEFT HEART CATHETERIZATION WITH CORONARY ANGIOGRAM N/A 12/15/2011   Procedure: LEFT HEART CATHETERIZATION WITH CORONARY ANGIOGRAM;  Surgeon: Burnell Blanks, MD;  Location: [facility] CATH LAB;  Service: Cardiovascular;  Laterality: N/A;   LEFT HEART CATHETERIZATION WITH CORONARY ANGIOGRAM N/A 01/10/2012   Procedure: LEFT HEART CATHETERIZATION WITH CORONARY ANGIOGRAM;  Surgeon: Peter M Martinique, MD;  Location: [facility] CATH LAB;  Service: Cardiovascular;  Laterality: N/A;   LEFT HEART CATHETERIZATION WITH CORONARY ANGIOGRAM N/A 06/08/2012   Procedure: LEFT HEART CATHETERIZATION WITH CORONARY ANGIOGRAM;  Surgeon: Burnell Blanks, MD;  Location: [facility] CATH LAB;  Service: Cardiovascular;  Laterality: N/A;   LEFT HEART CATHETERIZATION WITH CORONARY/GRAFT ANGIOGRAM N/A 12/10/2013   Procedure: LEFT HEART CATHETERIZATION WITH Beatrix Fetters;  Surgeon: [name], MD;  Location: [facility] CATH LAB;  Service: Cardiovascular;  Laterality: N/A;   OVARY SURGERY     ovarian cancer   POLYPECTOMY  03/09/2019   Procedure: POLYPECTOMY;  Surgeon: Rogene Houston, MD;  [facility];  Service: Endoscopy;;  cecal    POLYPECTOMY N/A 09/26/2019   Procedure: DUODENAL  POLYPECTOMY;  Surgeon: Rogene Houston, MD;  [facility];  Service: Endoscopy;  Laterality: N/A;   REVISION OF ARTERIOVENOUS GORETEX GRAFT N/A 02/24/2017   Procedure: REVISION OF ARTERIOVENOUS GORETEX GRAFT (RESECTION);  Surgeon: Katha Cabal, MD;  Location: [facility];  Service: Vascular;  Laterality: N/A;   REVISON OF ARTERIOVENOUS FISTULA Left 06/19/2020   Procedure: REVISION OF LEFT UPPER ARM AV GRAFT WITH INTERPOSITION JUMP GRAFT USING 6MM GORE LIMB;  Surgeon: Marty Heck, MD;  Location: [place];  Service: Vascular;  Laterality: Left;   SHUNTOGRAM N/A 10/15/2013   Procedure: Fistulogram;  Surgeon: Serafina Mitchell, MD;  Location: [facility] CATH LAB;  Service: Cardiovascular;  Laterality: N/A;   THROMBECTOMY / ARTERIOVENOUS GRAFT REVISION  2011   left upper arm   TUBAL LIGATION  1980's   UPPER EXTREMITY ANGIOGRAPHY Bilateral 12/06/2016   Procedure: Upper Extremity Angiography;  Surgeon: Katha Cabal, MD;  Location: [place] [facility];  Service: Cardiovascular;  Laterality: Bilateral;   UPPER EXTREMITY INTERVENTION Left 06/06/2017   Procedure: UPPER EXTREMITY INTERVENTION;  Surgeon: Katha Cabal, MD;  Location: [place] [facility];  Service: Cardiovascular;  Laterality: Left;     Home Medications:  Prior to Admission medications   Medication Sig Start Date End Date Taking? Authorizing Provider  ALPRAZolam Duanne Moron) 0.5 MG tablet Take 0.5 mg by mouth at bedtime. 03/20/21   [provider]  amiodarone (PACERONE) 200 MG tablet Take 1 tablet (200 mg total) by mouth daily. 05/13/21   Strader, Fransisco Hertz, PA-C  aspirin EC 81 MG tablet Take 81 mg by mouth daily.  09/27/19   Rehman, Mechele Dawley, MD  fluticasone (FLONASE) 50 MCG/ACT nasal spray Place 1 spray into both nostrils at bedtime as needed for allergies.     [provider]  hydrOXYzine (VISTARIL) 100 MG capsule Take 100 mg by mouth daily as needed for anxiety. 11/23/20   [provider]   isosorbide mononitrate (IMDUR) 30 MG 24 hr tablet Take 1 tablet (30 mg total) by mouth daily. 07/08/21   Shelly Coss, MD  levothyroxine (SYNTHROID) 25 MCG tablet Take 1 tablet (25 mcg total) by mouth daily. 02/02/21  Brita Romp, NP  lidocaine-prilocaine (EMLA) cream Apply 1 application topically every Monday, Wednesday, and Friday. Prior to dialysis    [provider]  loperamide (IMODIUM A-D) 2 MG tablet Take 2 tablets (4 mg total) by mouth daily as needed for diarrhea or loose stools. 12/17/20   Thurnell Lose, MD  metoprolol tartrate (LOPRESSOR) 25 MG tablet Take 0.5 tablets (12.5 mg total) by mouth 2 (two) times daily. 07/08/21   Shelly Coss, MD  midodrine (PROAMATINE) 5 MG tablet Take Monday, Wednesday, Friday morning BEFORE dialysis 12/22/20   Arnoldo Lenis, MD  multivitamin (RENA-VIT) TABS tablet Take 1 tablet by mouth daily.    [provider]  Na Sulfate-K Sulfate-Mg Sulf (SUPREP BOWEL PREP KIT) 17.5-3.13-1.6 GM/177ML SOLN Take 1 kit by mouth as directed. 06/11/21   Harvel Quale, MD  nitroGLYCERIN (NITROSTAT) 0.4 MG SL tablet Place 1 tablet (0.4 mg total) under the tongue every 5 (five) minutes x 3 doses as needed for chest pain (if no relief after 2nd dose, proceed to the ED for an evaluation or call 911). 02/19/21   Verta Ellen., NP  omeprazole (PRILOSEC) 20 MG capsule Take 1 capsule (20 mg total) by mouth daily. 12/01/20   Rehman, Mechele Dawley, MD  rosuvastatin (CRESTOR) 10 MG tablet Take 1 tablet (10 mg total) by mouth daily. 05/13/21   Strader, Fransisco Hertz, PA-C  sevelamer carbonate (RENVELA) 800 MG tablet Take 3 tablets (2,400 mg total) by mouth 3 (three) times daily with meals. 07/08/21   Shelly Coss, MD    Inpatient Medications: Scheduled Meds:  ALPRAZolam  0.5 mg Oral QHS   amiodarone  200 mg Oral Daily   aspirin EC  81 mg Oral Daily   [START ON 07/17/2021] doxycycline  100 mg Oral Q12H   isosorbide mononitrate  90 mg Oral  BID   levothyroxine  25 mcg Oral Daily   metoprolol tartrate  12.5 mg Oral BID   midodrine  5 mg Oral Q M,W,F-HD   multivitamin  1 tablet Oral Daily   rosuvastatin  10 mg Oral Daily   sevelamer carbonate  2,400 mg Oral TID WC   Continuous Infusions:  azithromycin 500 mg (07/16/21 0630)   cefTRIAXone (ROCEPHIN)  IV Stopped (07/16/21 0630)   PRN Meds: bisacodyl, hydrOXYzine, nitroGLYCERIN, ondansetron **OR** ondansetron (ZOFRAN) IV  Allergies:    Allergies  Allergen Reactions   Amlodipine Swelling   Aspirin Other (See Comments)    High Doses Mess up her stomach; "makes my bowels have blood in them". Takes 81 mg EC Aspirin    Nitrofurantoin Hives   Penicillins Other (See Comments)    SYNCOPE? , "makes me real weak when I take it; like I'll pass out"  Has patient had a PCN reaction causing immediate rash, facial/tongue/throat swelling, SOB or lightheadedness with hypotension: Yes Has patient had a PCN reaction causing severe rash involving mucus membranes or skin necrosis: no Has patient had a PCN reaction that required hospitalization no Has patient had a PCN reaction occurring within the last 10 years: no If all of the above    Bactrim [Sulfamethoxazole-Trimethoprim] Rash   Contrast Media [Iodinated Contrast Media] Itching   Iron Itching and Other (See Comments)    "they gave me iron in dialysis; had to give me Benadryl cause I had to have the iron" (05/02/2012)   Tylenol [Acetaminophen] Itching and Other (See Comments)    Makes her feet on fire per pt   Gabapentin Other (See  Comments)    Unknown reaction   Iron Sucrose    No Known Allergies    Ranexa [Ranolazine] Other (See Comments)    Myoclonus-hospitalized    Sucroferric Oxyhydroxide    Clopidogrel Rash   Dexilant [Dexlansoprazole] Other (See Comments)    Upset stomach   Hydralazine Itching    Has tolerated while inpatient Has tolerated while inpatient   Levaquin [Levofloxacin In D5w] Rash   Levofloxacin Rash    Morphine Itching   Morphine And Related Itching and Other (See Comments)    Itching in feet   Pantoprazole Rash   Plavix [Clopidogrel Bisulfate] Rash   Protonix [Pantoprazole Sodium] Rash   Venofer [Ferric Oxide] Itching and Other (See Comments)    Patient reports using Benadryl prior to doses as [place] History:   Social History   Socioeconomic History   Marital status: Married    Spouse name: [name]   Number of children: Not on file   Years of education: Not on file   Highest education level: Not on file  Occupational History   Not on file  Tobacco Use   Smoking status: Never   Smokeless tobacco: Never  Vaping Use   Vaping Use: Never used  Substance and Sexual Activity   Alcohol use: No    Alcohol/week: 0.0 standard drinks   Drug use: No   Sexual activity: Yes    Birth control/protection: Surgical  Other Topics Concern   Not on file  Social History Narrative   Lives in [place], [place] with husband.  Dialysis pt - mwf.   Social Determinants of Health   Financial Resource Strain: Not on file  Food Insecurity: Not on file  Transportation Needs: Not on file  Physical Activity: Not on file  Stress: Not on file  Social Connections: Not on file  Intimate Partner Violence: Not on file    Family History:    Family History  Problem Relation Age of Onset   Heart disease Mother        Heart Disease before age 29   Hyperlipidemia Mother    Hypertension Mother    Diabetes Mother    Heart attack Mother    Heart disease Father        Heart Disease before age 48   Hyperlipidemia Father    Hypertension Father    Diabetes Father    Diabetes Sister    Hypertension Sister    Diabetes Brother    Hyperlipidemia Brother    Heart attack Brother    Hypertension Sister    Heart attack Brother    Colon cancer Child 32   Other Other        noncontributory for early CAD   Esophageal cancer Neg Hx    Liver disease Neg Hx    Kidney disease Neg Hx    Colon  polyps Neg Hx      ROS:  Please see the history of present illness.   All other ROS reviewed and negative.     Physical Exam/Data:   Vitals:   07/16/21 0500 07/16/21 0530 07/16/21 0848 07/16/21 0900  BP: (!) 145/61 (!) 125/47  125/64  Pulse: 81 75 77 71  Resp: (!) 24 20 18  (!) 21  Temp:      TempSrc:      SpO2: 100% 100% 100% 100%  Weight:      Height:        Intake/Output Summary (Last 24 hours) at 07/16/2021 0930  Last data filed at 07/16/2021 0630 Gross per 24 hour  Intake 100 ml  Output --  Net 100 ml   Last 3 Weights 07/15/2021 07/15/2021 07/08/2021  Weight (lbs) 119 lb 119 lb 119 lb 8 oz  Weight (kg) 53.978 kg 53.978 kg 54.205 kg     Body mass index is 22.48 kg/m.  General:  Pleasant elderly female appearing in no acute distress HEENT: normal Neck: JVD elevated to 9 cm.  Vascular: No carotid bruits; Distal pulses 2+ bilaterally Cardiac:  normal S1, S2; RRR with occasional ectopic beats.  Lungs:  clear to auscultation bilaterally, no wheezing, rhonchi or rales  Abd: soft, nontender, no hepatomegaly  Ext: Trace lower extremity edema bilaterally.  Musculoskeletal:  No deformities, BUE and BLE strength normal and equal Skin: warm and dry  Neuro:  CNs 2-12 intact, no focal abnormalities noted Psych:  Normal affect   Telemetry:  Telemetry was personally reviewed and demonstrates: Atrial flutter vs SVT, HR in 140's until 0200. Has since been in NSR with HR in the 70's to 80's.   Relevant CV Studies:  Echocardiogram: 03/24/2021 IMPRESSIONS     1. Left ventricular ejection fraction, by estimation, is 50%. The left  ventricle has low normal function. The left ventricle has no regional wall  motion abnormalities. There is mild left ventricular hypertrophy. Left  ventricular diastolic parameters are   consistent with Grade II diastolic dysfunction (pseudonormalization).  Elevated left atrial pressure.   2. Right ventricular systolic function is normal. The right  ventricular  size is normal. There is moderately elevated pulmonary artery systolic  pressure.   3. Left atrial size was severely dilated.   4. Right atrial size was mildly dilated.   5. The mitral valve is abnormal. Moderate mitral valve regurgitation.  Mild mitral stenosis. Moderate mitral annular calcification.   6. The tricuspid valve is abnormal. Tricuspid valve regurgitation is  moderate.   7. The aortic valve is tricuspid. There is moderate calcification of the  aortic valve. There is moderate thickening of the aortic valve. Aortic  valve regurgitation is not visualized. No aortic stenosis is present.   8. The inferior vena cava is normal in size with greater than 50%  respiratory variability, suggesting right atrial pressure of 3 mmHg.   Laboratory Data:  High Sensitivity Troponin:   Recent Labs  Lab 07/07/21 0359 07/07/21 1954 07/15/21 2336 07/16/21 0151 07/16/21 0756  TROPONINIHS 48* 33* 48* 60* 237*     Chemistry Recent Labs  Lab 07/15/21 2336  NA 139  K 4.8  CL 101  CO2 24  GLUCOSE 154*  BUN 36*  CREATININE 6.22*  CALCIUM 9.1  GFRNONAA 6*  ANIONGAP 14    No results for input(s): PROT, ALBUMIN, AST, ALT, ALKPHOS, BILITOT in the last 168 hours. Lipids  Recent Labs  Lab 07/16/21 0151  CHOL 131  TRIG 90  HDL 60  LDLCALC 53  CHOLHDL 2.2    Hematology Recent Labs  Lab 07/15/21 2336  WBC 9.0  RBC 2.05*  HGB 8.1*  HCT 26.1*  MCV 127.3*  MCH 39.5*  MCHC 31.0  RDW 17.4*  PLT PLATELET CLUMPS NOTED ON SMEAR, COUNT APPEARS DECREASED   Thyroid No results for input(s): TSH, FREET4 in the last 168 hours.  BNPNo results for input(s): BNP, PROBNP in the last 168 hours.  DDimer No results for input(s): DDIMER in the last 168 hours.   Radiology/Studies:  DG Chest 2 View  Result Date: 07/15/2021 CLINICAL DATA:  Central  chest pain. EXAM: CHEST - 2 VIEW COMPARISON:  July 06, 2021 FINDINGS: Multiple sternal wires and vascular clips are seen. Mild,  stable, diffusely increased lung markings are noted. Mild atelectasis is seen within the bilateral lung bases. Small bilateral pleural effusions are present. No pneumothorax is identified. There is mild to moderate severity enlargement of the cardiac silhouette. This is increased in severity when compared to the prior study. A coronary artery stent is noted. A radiopaque vascular stent is seen within the soft tissues of the proximal left upper extremity. Multilevel degenerative changes seen throughout the thoracic spine. IMPRESSION: 1. Evidence of prior median sternotomy/CABG. 2. Mild to moderate severity enlargement of the cardiac silhouette which represents a new finding when compared to the exam. Interval development of a pericardial effusion cannot be excluded. Correlation with chest CT is recommended. 3. Mild bibasilar atelectasis. 4. Small bilateral pleural effusions. Electronically Signed   By: Virgina Norfolk M.D.   On: 07/15/2021 23:33   CT Chest Wo Contrast  Result Date: 07/16/2021 CLINICAL DATA:  81 year old female with chest pain. Pleural effusion. EXAM: CT CHEST WITHOUT CONTRAST TECHNIQUE: Multidetector CT imaging of the chest was performed following the standard protocol without IV contrast. COMPARISON:  Chest radiographs 07/15/2021. [facility] Noncontrast CT 02/26/2018. CT Abdomen and Pelvis 04/06/2020. FINDINGS: Cardiovascular: Prior CABG. Extensive calcified coronary artery and Calcified aortic atherosclerosis. Mild cardiomegaly. No pericardial effusion. Vascular patency is not evaluated in the absence of IV contrast. Mediastinum/Nodes: Stable. Upper limits of normal to mildly reactive appearing mediastinal lymph nodes have not significantly changed since 2019. Lungs/Pleura: Small layering pleural effusions, trace on the left. Major airways are patent. However, there is pronounced bilateral lower lobe and right middle lobe bronchial wall thickening. Somewhat generalized increased  pulmonary septal thickening also, and occasional areas of vague peribronchial ground-glass opacity (left lung series 4, image 42 right lung image 54). Superimposed chronic lung scarring. Confluent opacity in the costophrenic angles indeterminate for atelectasis versus infection. Upper Abdomen: Severe Calcified aortic atherosclerosis. Bilateral renal atrophy. Diverticulosis of the visible large bowel. Negative visible noncontrast liver, spleen, pancreas and stomach. Musculoskeletal: Prior sternotomy. Exaggerated thoracic kyphosis. Widespread advanced thoracic disc degeneration. Mild L1 superior endplate compression is new since 2019 but was probably acute last year. No acute osseous abnormality identified. IMPRESSION: 1. Severe Aortic Atherosclerosis (ICD10-I70.0) with prior CABG. Mild cardiomegaly. 2. Small layering pleural effusions (trace on the left) with generalized pulmonary septal thickening and vague asymmetric pulmonary ground-glass opacity new from 2019. Suspect pulmonary interstitial edema, however, there is also new bilateral lower lobe and right middle lobe bronchial wall thickening with confluent lung opacity in both costophrenic angles - suspicious for superimposed acute infection. Alternatively, the entire constellation might reflect a viral respiratory illness. 3. Mild L1 superior endplate compression fracture is new since 2019 but was probably chronic. Electronically Signed   By: Genevie Ann M.D.   On: 07/16/2021 05:22     Assessment and Plan:   1. Elevated Troponin Values - Presented with chest pain and a productive cough which started the day prior to admission. Medical management was recommended during her recent admission given poor targets for PCI by prior cath and poor response to stenting in the past.  - Initial Hs Troponin at 60 with repeat of 237. Additional enzymes pending. She was in atrial flutter with RVR while in the ED and this could be a component of demand ischemia but would  continue to trend. Repeat echocardiogram is pending to assess LV function and  wall motion along with ruling out a pericardial effusion given concerns for this by CT. If enzymes continue to trend up, may need to start Heparin but she is pain-free currently.    2. CAD - She is s/p CABG in 2013, DES to D1 in 10/2016 and cath in 07/2018 showing patent grafts with occlusion of D1 at prior stent site and progression of PDA disease with medical management recommended. Her case was reviewed with Interventional Cardiology last admission and her PDA was felt to be a poor target for PCI and medical management was recommended. Pending enzyme trend and repeat echo, may need to consider a re-look cath but would try to avoid unless significant changes.  - Continue ASA, Lopressor and Crestor. She was discharged on Imdur 33m daily last admission (previously on 917mdaily prior to that admission) and notes from Cardiology recommended being on 12038mID. Will dose at 22m5mD for now and follow BP response.   3. HFpEF - Volume status has been managed by HD. Due for HD today and Nephrology has been consulted.   4. Paroxysmal Atrial Flutter/SVT - She has a known history of this and has not been on anticoagulation due to her chronic anemia. She did have an episode of likely atrial flutter with RVR while in the ED with HR in the 140's but spontaneously converted back to NSR. Remains on Amiodarone 200mg32mly and Lopressor 12.5mg B20m May require additional Amiodarone loading if she has recurrence.   5. Anemia - Hgb at 8.1 which is close to her baseline. No reports of active bleeding.   6. ESRD - HD - MWF. Nephrology has been consulted by the admitting team.   7. Likely Viral Illness - She does report dyspnea (has this chronically) but has been experienced a productive cough. Procalcitionin at 0.12. Remains on antibiotic therapy per the admitting team.    Risk Assessment/Risk Scores:     TIMI Risk Score for  Unstable Angina or Non-ST Elevation MI:   The patient's TIMI risk score is 6, which indicates a 41% risk of all cause mortality, new or recurrent myocardial infarction or need for urgent revascularization in the next 14 days.   For questions or updates, please contact [facility] consult [email or web address] for contact info under    Signed, BrittaErma Heritage  07/16/2021 9:30 AM  Patient seen and disucssed with PA StradeAhmed Primaree with her documentation above. 80 yo 2male history of CAD with CABG in 2013 and DES to D1 in 10/2016. Most recent cath Jan 2020 with patent grafts and occluded D1 at stent site, medical management was recommended. History of chronic diastolic HF, HTN, HL, PAF non on anticoag due to GI bleed, NSVT, ESRD, hypothyroidism.    She has had chronic stable chest pains symptoms for quite some time. Antianginal therpay has been limited due to low bp's and side effects. We did increase her imdur to 22mg d19m at our 06/22/21 visit. Admission 07/07/21 where Dr PembertJohney Frameed prior cath films with interventionl cardiology   "Reviewed 2020 cath films with interventional Cardiology which showed patent bypasses and small PDA lesion. After review of films, would not go after PDA lesion as very small territory"   Presents with chest pain. On my history she reports aching pain started right chest crosses into left chest. Not positional. Consistent with her chronic symptoms but more intense. Typically pain improved with NG but did not this time and came to ER.    Admit labs  K 4.8 Cr 6.22 BUN 36 WBC 9 Hgb 8.1 Plt clumps Trop 48-->60-->237--> COVID neg, flu neg CXR cardiogemegaly CT chest: small bilateral effusions, probable edema, possible pneumonia EKG chronic LBBB     03/2021 echo LVEF 50%, grade II dd     History of CAD as reported above with chronic chest pain. Presents with chest pain in setting of missing HD as well as possible respiratory infection. She denies  missing an HD session to me but reports recent sessions have had incomplete fluid removal. She also had episode of aflutter with RVR which would increase demand. EKG is chronic LBBB, uptrending trop to 237 difficult to interpret in ESRD patient volume overloaded with possible infection but also known history of CAD. Would follow trend, f/u echo. Follow symptoms as her fluid status improves and treated for respiratory infection. Considerations for ischemic testing would depend on additional findigns and symptoms going forward this admission.    Medical therapy with ASA 81, crestor 10, lopressor 12.67m bid. Would start hep gtt until troponin trend is established.    Also with aflutter episode here, she is on amio at home and lopressor 12.590mbid. Monitor at this time, if significant recurrences can reload with amio.   JoCarlyle DollyD

## 2021-07-16 NOTE — Consult Note (Signed)
Shawna [name] Renal Consultation Note    Indication for Consultation:  Management of ESRD/hemodialysis; anemia, hypertension/volume and secondary hyperparathyroidism  HPI: Shawna [name] is a 81 y.o. female with a PMH significant for HTN, gout, GI issues including colon AVM/GERD/recurrent GIB, CAD s/p CABG and Afib, anxiety and ESRD- MWF [facility] who presented to [facility] ED with SSCP not relieved with NTG.  She had a similar presentation to [facility] from 12/27-12/29/22.  In the ED she was noted to be tachycardic with HR in the 140's, Labs were notable for Hgb 8.1, troponin I 60->237.  EKG revealed atrial flutter vs SVT and LBBB.  She is being admitted for further cardiac evaluation and we were consulted to provide dialysis during her hospitalization.    She reports that at HD on Wednesday, she had some palpitations and BP dropped so they couldn't get her to her edw that day.     Past Medical History:  Diagnosis Date   Acute on chronic respiratory failure with hypoxia ([place]) 10/10/2016   Anxiety    Arthritis    AVM (arteriovenous malformation) of colon    CAD (coronary artery disease)    a. s/p CABG in 2013 b. DES to D1 in 10/2016. c. cath in 07/2018 showing patent grafts with occlusion of D1 at prior stent site and progression of PDA disease --> medical management recommended   Carotid artery disease ([place])    a. 06-30% LICA, 07/6008    Chronic anemia    Chronic bronchitis (HCC)    Chronic diastolic CHF (congestive heart failure) ([place])    a. 02/2012 Echo EF 60-65%, nl wall motion, Gr 1 DD, mod MR   Colon cancer ([place]) 1992   Esophageal stricture    ESRD on hemodialysis ([place])    ESRD due to HTN, started dialysis 2011 and gets HD at [facility] with Dr Hinda Lenis on MWF schedule.  Access is LUA AVF as of Sept 2014.    GERD (gastroesophageal reflux disease)    High cholesterol 12/2011   History of blood transfusion 07/2011; 12/2011; 01/2012 X 2; 04/2012   History of gout    History of lower GI  bleeding    Hypertension    Iron deficiency anemia    Jugular vein occlusion, right (HCC)    Mitral regurgitation    a. Moderate by echo, 02/2012   Myocardial infarction [facility])    NSVT (nonsustained ventricular tachycardia)    Ovarian cancer ([place]) 1992   PAF (paroxysmal atrial fibrillation) ([place])    Pneumonia ~ 2009   PUD (peptic ulcer disease)    TIA (transient ischemic attack)    Past Surgical History:  Procedure Laterality Date   A/V SHUNTOGRAM Left 03/19/2019   Procedure: A/V SHUNTOGRAM;  Surgeon: Katha Cabal, MD;  Location: [place] CV LAB;  Service: Cardiovascular;  Laterality: Left;   ABDOMINAL HYSTERECTOMY  1992   APPENDECTOMY  06/1990   AV FISTULA PLACEMENT  07/2009   left upper arm   AV FISTULA PLACEMENT Right 09/06/2016   Procedure: RIGHT FOREARM ARTERIOVENOUS (AV) GRAFT;  Surgeon: Elam Dutch, MD;  Location: [place];  Service: Vascular;  Laterality: Right;   AV FISTULA PLACEMENT N/A 02/24/2017   Procedure: INSERTION OF ARTERIOVENOUS (AV) GORE-TEX GRAFT ARM (BRACHIAL AXILLARY);  Surgeon: Katha Cabal, MD;  Location: [facility] ORS;  Service: Vascular;  Laterality: N/A;   [place] Right 09/06/2016   Procedure: REMOVAL OF Right Arm ARTERIOVENOUS GORETEX GRAFT and Vein Patch angioplasty of brachial artery;  Surgeon: Angelia Mould, MD;  Location: [place];  Service: Vascular;  Laterality: Right;   BIOPSY  09/26/2019   Procedure: BIOPSY;  Surgeon: Rogene Houston, MD;  [facility];  Service: Endoscopy;;   COLON RESECTION  1992   COLON SURGERY     COLONOSCOPY N/A 03/09/2019   Procedure: COLONOSCOPY;  Surgeon: Rogene Houston, MD;  [facility];  Service: Endoscopy;  Laterality: N/A;   COLONOSCOPY WITH PROPOFOL N/A 06/08/2021   Procedure: COLONOSCOPY WITH PROPOFOL;  Surgeon: Harvel Quale, MD;  [facility];  Service: Gastroenterology;  Laterality: N/A;  9:05 /Patient is on dialysis Mon Wed Fri   CORONARY  ANGIOPLASTY WITH STENT PLACEMENT  12/15/11   "2"   CORONARY ANGIOPLASTY WITH STENT PLACEMENT  y/2013   "1; makes total of 3" (05/02/2012)   CORONARY ARTERY BYPASS GRAFT  06/13/2012   Procedure: CORONARY ARTERY BYPASS GRAFTING (CABG);  Surgeon: Grace Isaac, MD;  Location: [place];  Service: Open Heart Surgery;  Laterality: N/A;  cabg x four;  using left internal mammary artery, and left leg greater saphenous vein harvested endoscopically   CORONARY STENT INTERVENTION N/A 10/13/2016   Procedure: Coronary Stent Intervention;  Surgeon: Troy Sine, MD;  Location: [place] CV LAB;  Service: Cardiovascular;  Laterality: N/A;   DIALYSIS/PERMA CATHETER REMOVAL N/A 04/18/2017   Procedure: DIALYSIS/PERMA CATHETER REMOVAL;  Surgeon: Katha Cabal, MD;  Location: [place] CV LAB;  Service: Cardiovascular;  Laterality: N/A;   DILATION AND CURETTAGE OF UTERUS     ENTEROSCOPY N/A 06/08/2021   Procedure: PUSH ENTEROSCOPY;  Surgeon: Harvel Quale, MD;  [facility];  Service: Gastroenterology;  Laterality: N/A;   ESOPHAGOGASTRODUODENOSCOPY  01/20/2012   Procedure: ESOPHAGOGASTRODUODENOSCOPY (EGD);  Surgeon: Ladene Artist, MD,FACG;  Location: [facility] ENDOSCOPY;  Service: Endoscopy;  Laterality: N/A;   ESOPHAGOGASTRODUODENOSCOPY N/A 03/26/2013   Procedure: ESOPHAGOGASTRODUODENOSCOPY (EGD);  Surgeon: Irene Shipper, MD;  Location: [facility] ENDOSCOPY;  Service: Endoscopy;  Laterality: N/A;   ESOPHAGOGASTRODUODENOSCOPY N/A 04/30/2015   Procedure: ESOPHAGOGASTRODUODENOSCOPY (EGD);  Surgeon: Rogene Houston, MD;  [facility];  Service: Endoscopy;  Laterality: N/A;  1pm - moved to 10/20 @ 1:10   ESOPHAGOGASTRODUODENOSCOPY N/A 07/29/2016   Procedure: ESOPHAGOGASTRODUODENOSCOPY (EGD);  Surgeon: Manus Gunning, MD;  Location: [place];  Service: Gastroenterology;  Laterality: N/A;  enteroscopy   ESOPHAGOGASTRODUODENOSCOPY N/A 09/26/2019   Procedure: ESOPHAGOGASTRODUODENOSCOPY  (EGD);  Surgeon: Rogene Houston, MD;  [facility];  Service: Endoscopy;  Laterality: N/A;  1250   ESOPHAGOGASTRODUODENOSCOPY (EGD) WITH PROPOFOL N/A 02/05/2021   Procedure: ESOPHAGOGASTRODUODENOSCOPY (EGD) WITH PROPOFOL;  Surgeon: Eloise Harman, DO;  [facility];  Service: Endoscopy;  Laterality: N/A;   GIVENS CAPSULE STUDY N/A 03/07/2019   Procedure: GIVENS CAPSULE STUDY;  Surgeon: Rogene Houston, MD;  [facility];  Service: Endoscopy;  Laterality: N/A;  7:30   GIVENS CAPSULE STUDY N/A 04/22/2021   Procedure: GIVENS CAPSULE STUDY;  Surgeon: Rogene Houston, MD;  [facility];  Service: Endoscopy;  Laterality: N/A;  7:30   INSERTION OF DIALYSIS CATHETER N/A 10/05/2020   Procedure: ABORTED TUNNELED DIALYSIS CATHETER PLACEMENT RIGHT INTERNAL JUGULAR VEIN ;  Surgeon: Virl Cagey, MD;  [facility];  Service: General;  Laterality: N/A;   INTRAOPERATIVE TRANSESOPHAGEAL ECHOCARDIOGRAM  06/13/2012   Procedure: INTRAOPERATIVE TRANSESOPHAGEAL ECHOCARDIOGRAM;  Surgeon: Grace Isaac, MD;  Location: [place];  Service: Open Heart  Surgery;  Laterality: N/A;   IR DIALY SHUNT INTRO NEEDLE/INTRACATH INITIAL W/IMG LEFT Left 10/06/2020   IR FLUORO GUIDE CV LINE RIGHT  06/17/2020   IR GENERIC HISTORICAL  07/26/2016   IR FLUORO GUIDE CV LINE RIGHT 07/26/2016 Greggory Keen, MD [facility]   IR GENERIC HISTORICAL  07/26/2016   IR US GUIDE VASC ACCESS RIGHT 07/26/2016 Greggory Keen, MD [facility]   IR GENERIC HISTORICAL  08/02/2016   IR US GUIDE VASC ACCESS RIGHT 08/02/2016 Greggory Keen, MD [facility]   IR GENERIC HISTORICAL  08/02/2016   IR FLUORO GUIDE CV LINE RIGHT 08/02/2016 Greggory Keen, MD [facility]   IR RADIOLOGY PERIPHERAL GUIDED IV START  03/28/2017   IR REMOVAL TUN CV CATH W/O FL  08/11/2020   IR THROMBECTOMY AV FISTULA W/THROMBOLYSIS INC/SHUNT/IMG LEFT Left 06/17/2020   IR US GUIDE VASC ACCESS LEFT  06/17/2020   IR US GUIDE VASC ACCESS  RIGHT  03/28/2017   IR US GUIDE VASC ACCESS RIGHT  06/17/2020   LEFT HEART CATH AND CORONARY ANGIOGRAPHY N/A 09/20/2016   Procedure: Left Heart Cath and Coronary Angiography;  Surgeon: [name], MD;  Location: [place] CV LAB;  Service: Cardiovascular;  Laterality: N/A;   LEFT HEART CATH AND CORS/GRAFTS ANGIOGRAPHY N/A 10/13/2016   Procedure: Left Heart Cath and Cors/Grafts Angiography;  Surgeon: Troy Sine, MD;  Location: [place] CV LAB;  Service: Cardiovascular;  Laterality: N/A;   LEFT HEART CATH AND CORS/GRAFTS ANGIOGRAPHY N/A 07/13/2018   Procedure: LEFT HEART CATH AND CORS/GRAFTS ANGIOGRAPHY;  Surgeon: Martinique, Peter M, MD;  Location: [place] CV LAB;  Service: Cardiovascular;  Laterality: N/A;   LEFT HEART CATHETERIZATION WITH CORONARY ANGIOGRAM N/A 12/15/2011   Procedure: LEFT HEART CATHETERIZATION WITH CORONARY ANGIOGRAM;  Surgeon: Burnell Blanks, MD;  Location: [facility] CATH LAB;  Service: Cardiovascular;  Laterality: N/A;   LEFT HEART CATHETERIZATION WITH CORONARY ANGIOGRAM N/A 01/10/2012   Procedure: LEFT HEART CATHETERIZATION WITH CORONARY ANGIOGRAM;  Surgeon: Peter M Martinique, MD;  Location: [facility] CATH LAB;  Service: Cardiovascular;  Laterality: N/A;   LEFT HEART CATHETERIZATION WITH CORONARY ANGIOGRAM N/A 06/08/2012   Procedure: LEFT HEART CATHETERIZATION WITH CORONARY ANGIOGRAM;  Surgeon: Burnell Blanks, MD;  Location: [facility] CATH LAB;  Service: Cardiovascular;  Laterality: N/A;   LEFT HEART CATHETERIZATION WITH CORONARY/GRAFT ANGIOGRAM N/A 12/10/2013   Procedure: LEFT HEART CATHETERIZATION WITH Beatrix Fetters;  Surgeon: [name], MD;  Location: [facility] CATH LAB;  Service: Cardiovascular;  Laterality: N/A;   OVARY SURGERY     ovarian cancer   POLYPECTOMY  03/09/2019   Procedure: POLYPECTOMY;  Surgeon: Rogene Houston, MD;  [facility];  Service: Endoscopy;;  cecal    POLYPECTOMY N/A 09/26/2019   Procedure: DUODENAL POLYPECTOMY;  Surgeon: Rogene Houston, MD;  [facility];  Service: Endoscopy;  Laterality: N/A;   REVISION OF ARTERIOVENOUS GORETEX GRAFT N/A 02/24/2017   Procedure: REVISION OF ARTERIOVENOUS GORETEX GRAFT (RESECTION);  Surgeon: Katha Cabal, MD;  Location: [facility] OR[facility];  Service: Vascular;  Laterality: N/A;   REVISON OF ARTERIOVENOUS FISTULA Left 06/19/2020   Procedure: REVISION OF LEFT UPPER ARM AV GRAFT WITH INTERPOSITION JUMP GRAFT USING 6MM GORE LIMB;  Surgeon: Marty Heck, MD;  Location: [place];  Service: Vascular;  Laterality: Left;   SHUNTOGRAM N/A 10/15/2013   Procedure: Fistulogram;  Surgeon: Serafina Mitchell, MD;  Location: [facility] CATH LAB;  Service: Cardiovascular;  Laterality: N/A;   THROMBECTOMY / ARTERIOVENOUS GRAFT  REVISION  2011   left upper arm   TUBAL LIGATION  1980's   UPPER EXTREMITY ANGIOGRAPHY Bilateral 12/06/2016   Procedure: Upper Extremity Angiography;  Surgeon: Katha Cabal, MD;  Location: [place] CV LAB;  Service: Cardiovascular;  Laterality: Bilateral;   UPPER EXTREMITY INTERVENTION Left 06/06/2017   Procedure: UPPER EXTREMITY INTERVENTION;  Surgeon: Katha Cabal, MD;  Location: [place] CV LAB;  Service: Cardiovascular;  Laterality: Left;   Family History:   Family History  Problem Relation Age of Onset   Heart disease Mother        Heart Disease before age 56   Hyperlipidemia Mother    Hypertension Mother    Diabetes Mother    Heart attack Mother    Heart disease Father        Heart Disease before age 38   Hyperlipidemia Father    Hypertension Father    Diabetes Father    Diabetes Sister    Hypertension Sister    Diabetes Brother    Hyperlipidemia Brother    Heart attack Brother    Hypertension Sister    Heart attack Brother    Colon cancer Child 28   Other Other        noncontributory for early CAD   Esophageal cancer Neg Hx    Liver disease Neg Hx    Kidney disease Neg Hx    Colon polyps Neg Hx    Social History:  reports that she  has never smoked. She has never used smokeless tobacco. She reports that she does not drink alcohol and does not use drugs. Allergies  Allergen Reactions   Amlodipine Swelling   Aspirin Other (See Comments)    High Doses Mess up her stomach; "makes my bowels have blood in them". Takes 81 mg EC Aspirin    Nitrofurantoin Hives   Penicillins Other (See Comments)    SYNCOPE? , "makes me real weak when I take it; like I'll pass out"  Has patient had a PCN reaction causing immediate rash, facial/tongue/throat swelling, SOB or lightheadedness with hypotension: Yes Has patient had a PCN reaction causing severe rash involving mucus membranes or skin necrosis: no Has patient had a PCN reaction that required hospitalization no Has patient had a PCN reaction occurring within the last 10 years: no If all of the above    Bactrim [Sulfamethoxazole-Trimethoprim] Rash   Contrast Media [Iodinated Contrast Media] Itching   Iron Itching and Other (See Comments)    "they gave me iron in dialysis; had to give me Benadryl cause I had to have the iron" (05/02/2012)   Tylenol [Acetaminophen] Itching and Other (See Comments)    Makes her feet on fire per pt   Gabapentin Other (See Comments)    Unknown reaction   Iron Sucrose    No Known Allergies    Ranexa [Ranolazine] Other (See Comments)    Myoclonus-hospitalized    Sucroferric Oxyhydroxide    Clopidogrel Rash   Dexilant [Dexlansoprazole] Other (See Comments)    Upset stomach   Hydralazine Itching    Has tolerated while inpatient Has tolerated while inpatient   Levaquin [Levofloxacin In D5w] Rash   Levofloxacin Rash   Morphine Itching   Morphine And Related Itching and Other (See Comments)    Itching in feet   Pantoprazole Rash   Plavix [Clopidogrel Bisulfate] Rash   Protonix [Pantoprazole Sodium] Rash   Venofer [Ferric Oxide] Itching and Other (See Comments)    Patient reports using Benadryl prior  to doses as [facility]   Prior to  Admission medications   Medication Sig Start Date End Date Taking? Authorizing Provider  ALPRAZolam Duanne Moron) 0.5 MG tablet Take 0.5 mg by mouth at bedtime. 03/20/21   [provider]  amiodarone (PACERONE) 200 MG tablet Take 1 tablet (200 mg total) by mouth daily. 05/13/21   Strader, Fransisco Hertz, PA-C  aspirin EC 81 MG tablet Take 81 mg by mouth daily.  09/27/19   Rehman, Mechele Dawley, MD  fluticasone (FLONASE) 50 MCG/ACT nasal spray Place 1 spray into both nostrils at bedtime as needed for allergies.     [provider]  hydrOXYzine (VISTARIL) 100 MG capsule Take 100 mg by mouth daily as needed for anxiety. 11/23/20   [provider]  isosorbide mononitrate (IMDUR) 30 MG 24 hr tablet Take 1 tablet (30 mg total) by mouth daily. 07/08/21   Shelly Coss, MD  levothyroxine (SYNTHROID) 25 MCG tablet Take 1 tablet (25 mcg total) by mouth daily. 02/02/21   Brita Romp, NP  lidocaine-prilocaine (EMLA) cream Apply 1 application topically every Monday, Wednesday, and Friday. Prior to dialysis    [provider]  loperamide (IMODIUM A-D) 2 MG tablet Take 2 tablets (4 mg total) by mouth daily as needed for diarrhea or loose stools. 12/17/20   Thurnell Lose, MD  metoprolol tartrate (LOPRESSOR) 25 MG tablet Take 0.5 tablets (12.5 mg total) by mouth 2 (two) times daily. 07/08/21   Shelly Coss, MD  midodrine (PROAMATINE) 5 MG tablet Take Monday, Wednesday, Friday morning BEFORE dialysis 12/22/20   Arnoldo Lenis, MD  multivitamin (RENA-VIT) TABS tablet Take 1 tablet by mouth daily.    [provider]  Na Sulfate-K Sulfate-Mg Sulf (SUPREP BOWEL PREP KIT) 17.5-3.13-1.6 GM/177ML SOLN Take 1 kit by mouth as directed. 06/11/21   Harvel Quale, MD  nitroGLYCERIN (NITROSTAT) 0.4 MG SL tablet Place 1 tablet (0.4 mg total) under the tongue every 5 (five) minutes x 3 doses as needed for chest pain (if no relief after 2nd dose, proceed to the ED for an evaluation  or call 911). 02/19/21   Verta Ellen., NP  omeprazole (PRILOSEC) 20 MG capsule Take 1 capsule (20 mg total) by mouth daily. 12/01/20   Rehman, Mechele Dawley, MD  rosuvastatin (CRESTOR) 10 MG tablet Take 1 tablet (10 mg total) by mouth daily. 05/13/21   Strader, Fransisco Hertz, PA-C  sevelamer carbonate (RENVELA) 800 MG tablet Take 3 tablets (2,400 mg total) by mouth 3 (three) times daily with meals. 07/08/21   Shelly Coss, MD   Current Facility-Administered Medications  Medication Dose Route Frequency Provider Last Rate Last Admin   ALPRAZolam Duanne Moron) tablet 0.5 mg  0.5 mg Oral QHS Johnson, Clanford L, MD       amiodarone (PACERONE) tablet 200 mg  200 mg Oral Daily Johnson, Clanford L, MD       aspirin EC tablet 81 mg  81 mg Oral Daily Arnoldo Lenis, MD   81 mg at 07/16/21 1004   azithromycin (ZITHROMAX) 500 mg in sodium chloride 0.9 % 250 mL IVPB  500 mg Intravenous Q24H Johnson, Clanford L, MD 250 mL/hr at 07/16/21 0630 500 mg at 07/16/21 0630   bisacodyl (DULCOLAX) EC tablet 5 mg  5 mg Oral Daily PRN Johnson, Clanford L, MD       cefTRIAXone (ROCEPHIN) 1 g in sodium chloride 0.9 % 100 mL IVPB  1 g Intravenous Q24H Murlean Iba, MD  Stopped at 07/16/21 0630   Chlorhexidine Gluconate Cloth 2 % PADS 6 each  6 each Topical Q0600 Donato Heinz, MD       Derrill Memo ON 07/17/2021] doxycycline (VIBRA-TABS) tablet 100 mg  100 mg Oral Q12H Johnson, Clanford L, MD       heparin bolus via infusion 3,250 Units  3,250 Units Intravenous Once Johnson, Clanford L, MD       Followed by   heparin ADULT infusion 100 units/mL (25000 units/219m)  650 Units/hr Intravenous Continuous Johnson, Clanford L, MD       hydrOXYzine (ATARAX) tablet 100 mg  100 mg Oral Daily PRN Johnson, Clanford L, MD       insulin aspart (novoLOG) injection 0-6 Units  0-6 Units Subcutaneous TID WC Johnson, Clanford L, MD       insulin aspart (novoLOG) injection 2 Units  2 Units Subcutaneous TID WC Johnson, Clanford L, MD        isosorbide mononitrate (IMDUR) 24 hr tablet 90 mg  90 mg Oral BID Strader, Brittany M, PA-C   90 mg at 07/16/21 1008   levothyroxine (SYNTHROID) tablet 25 mcg  25 mcg Oral Daily Johnson, Clanford L, MD   25 mcg at 07/16/21 1007   metoprolol tartrate (LOPRESSOR) tablet 12.5 mg  12.5 mg Oral BID Johnson, Clanford L, MD       midodrine (PROAMATINE) tablet 5 mg  5 mg Oral Q M,W,F-HD Johnson, Clanford L, MD       multivitamin (RENA-VIT) tablet 1 tablet  1 tablet Oral Daily Johnson, Clanford L, MD   1 tablet at 07/16/21 1009   nitroGLYCERIN (NITROSTAT) SL tablet 0.4 mg  0.4 mg Sublingual Q5 Min x 3 PRN Johnson, Clanford L, MD       ondansetron (ZOFRAN) tablet 4 mg  4 mg Oral Q6H PRN Johnson, Clanford L, MD       Or   ondansetron (ZOFRAN) injection 4 mg  4 mg Intravenous Q6H PRN Johnson, Clanford L, MD   4 mg at 07/16/21 0816   rosuvastatin (CRESTOR) tablet 10 mg  10 mg Oral Daily Johnson, Clanford L, MD   10 mg at 07/16/21 1009   sevelamer carbonate (RENVELA) tablet 2,400 mg  2,400 mg Oral TID WC Johnson, Clanford L, MD   2,400 mg at 07/16/21 1010   Current Outpatient Medications  Medication Sig Dispense Refill   ALPRAZolam (XANAX) 0.5 MG tablet Take 0.5 mg by mouth at bedtime.     amiodarone (PACERONE) 200 MG tablet Take 1 tablet (200 mg total) by mouth daily. 90 tablet 2   aspirin EC 81 MG tablet Take 81 mg by mouth daily.      fluticasone (FLONASE) 50 MCG/ACT nasal spray Place 1 spray into both nostrils at bedtime as needed for allergies.      hydrOXYzine (VISTARIL) 100 MG capsule Take 100 mg by mouth daily as needed for anxiety.     isosorbide mononitrate (IMDUR) 30 MG 24 hr tablet Take 1 tablet (30 mg total) by mouth daily. 30 tablet 0   levothyroxine (SYNTHROID) 25 MCG tablet Take 1 tablet (25 mcg total) by mouth daily. 90 tablet 0   lidocaine-prilocaine (EMLA) cream Apply 1 application topically every Monday, Wednesday, and Friday. Prior to dialysis     loperamide (IMODIUM A-D) 2 MG tablet  Take 2 tablets (4 mg total) by mouth daily as needed for diarrhea or loose stools.     metoprolol tartrate (LOPRESSOR) 25 MG tablet Take 0.5 tablets (12.5 mg  total) by mouth 2 (two) times daily. 60 tablet 0   midodrine (PROAMATINE) 5 MG tablet Take Monday, Wednesday, Friday morning BEFORE dialysis 48 tablet 6   multivitamin (RENA-VIT) TABS tablet Take 1 tablet by mouth daily.     Na Sulfate-K Sulfate-Mg Sulf (SUPREP BOWEL PREP KIT) 17.5-3.13-1.6 GM/177ML SOLN Take 1 kit by mouth as directed. 354 mL 0   nitroGLYCERIN (NITROSTAT) 0.4 MG SL tablet Place 1 tablet (0.4 mg total) under the tongue every 5 (five) minutes x 3 doses as needed for chest pain (if no relief after 2nd dose, proceed to the ED for an evaluation or call 911). 25 tablet 3   omeprazole (PRILOSEC) 20 MG capsule Take 1 capsule (20 mg total) by mouth daily. 90 capsule 3   rosuvastatin (CRESTOR) 10 MG tablet Take 1 tablet (10 mg total) by mouth daily. 90 tablet 3   sevelamer carbonate (RENVELA) 800 MG tablet Take 3 tablets (2,400 mg total) by mouth 3 (three) times daily with meals.     Labs: Basic Metabolic Panel: Recent Labs  Lab 07/15/21 2336  NA 139  K 4.8  CL 101  CO2 24  GLUCOSE 154*  BUN 36*  CREATININE 6.22*  CALCIUM 9.1   Liver Function Tests: No results for input(s): AST, ALT, ALKPHOS, BILITOT, PROT, ALBUMIN in the last 168 hours. No results for input(s): LIPASE, AMYLASE in the last 168 hours. No results for input(s): AMMONIA in the last 168 hours. CBC: Recent Labs  Lab 07/15/21 2336  WBC 9.0  HGB 8.1*  HCT 26.1*  MCV 127.3*  PLT PLATELET CLUMPS NOTED ON SMEAR, COUNT APPEARS DECREASED   Cardiac Enzymes: No results for input(s): CKTOTAL, CKMB, CKMBINDEX, TROPONINI in the last 168 hours. CBG: No results for input(s): GLUCAP in the last 168 hours. Iron Studies: No results for input(s): IRON, TIBC, TRANSFERRIN, FERRITIN in the last 72 hours. Studies/Results: DG Chest 2 View  Result Date:  07/15/2021 CLINICAL DATA:  Central chest pain. EXAM: CHEST - 2 VIEW COMPARISON:  July 06, 2021 FINDINGS: Multiple sternal wires and vascular clips are seen. Mild, stable, diffusely increased lung markings are noted. Mild atelectasis is seen within the bilateral lung bases. Small bilateral pleural effusions are present. No pneumothorax is identified. There is mild to moderate severity enlargement of the cardiac silhouette. This is increased in severity when compared to the prior study. A coronary artery stent is noted. A radiopaque vascular stent is seen within the soft tissues of the proximal left upper extremity. Multilevel degenerative changes seen throughout the thoracic spine. IMPRESSION: 1. Evidence of prior median sternotomy/CABG. 2. Mild to moderate severity enlargement of the cardiac silhouette which represents a new finding when compared to the exam. Interval development of a pericardial effusion cannot be excluded. Correlation with chest CT is recommended. 3. Mild bibasilar atelectasis. 4. Small bilateral pleural effusions. Electronically Signed   By: Virgina Norfolk M.D.   On: 07/15/2021 23:33   CT Chest Wo Contrast  Result Date: 07/16/2021 CLINICAL DATA:  81 year old female with chest pain. Pleural effusion. EXAM: CT CHEST WITHOUT CONTRAST TECHNIQUE: Multidetector CT imaging of the chest was performed following the standard protocol without IV contrast. COMPARISON:  Chest radiographs 07/15/2021. [facility] Noncontrast CT 02/26/2018. CT Abdomen and Pelvis 04/06/2020. FINDINGS: Cardiovascular: Prior CABG. Extensive calcified coronary artery and Calcified aortic atherosclerosis. Mild cardiomegaly. No pericardial effusion. Vascular patency is not evaluated in the absence of IV contrast. Mediastinum/Nodes: Stable. Upper limits of normal to mildly reactive appearing mediastinal lymph nodes  have not significantly changed since 2019. Lungs/Pleura: Small layering pleural effusions, trace on  the left. Major airways are patent. However, there is pronounced bilateral lower lobe and right middle lobe bronchial wall thickening. Somewhat generalized increased pulmonary septal thickening also, and occasional areas of vague peribronchial ground-glass opacity (left lung series 4, image 42 right lung image 54). Superimposed chronic lung scarring. Confluent opacity in the costophrenic angles indeterminate for atelectasis versus infection. Upper Abdomen: Severe Calcified aortic atherosclerosis. Bilateral renal atrophy. Diverticulosis of the visible large bowel. Negative visible noncontrast liver, spleen, pancreas and stomach. Musculoskeletal: Prior sternotomy. Exaggerated thoracic kyphosis. Widespread advanced thoracic disc degeneration. Mild L1 superior endplate compression is new since 2019 but was probably acute last year. No acute osseous abnormality identified. IMPRESSION: 1. Severe Aortic Atherosclerosis (ICD10-I70.0) with prior CABG. Mild cardiomegaly. 2. Small layering pleural effusions (trace on the left) with generalized pulmonary septal thickening and vague asymmetric pulmonary ground-glass opacity new from 2019. Suspect pulmonary interstitial edema, however, there is also new bilateral lower lobe and right middle lobe bronchial wall thickening with confluent lung opacity in both costophrenic angles - suspicious for superimposed acute infection. Alternatively, the entire constellation might reflect a viral respiratory illness. 3. Mild L1 superior endplate compression fracture is new since 2019 but was probably chronic. Electronically Signed   By: Genevie Ann M.D.   On: 07/16/2021 05:22    ROS: Pertinent items are noted in HPI. Physical Exam: Vitals:   07/16/21 0500 07/16/21 0530 07/16/21 0848 07/16/21 0900  BP: (!) 145/61 (!) 125/47  125/64  Pulse: 81 75 77 71  Resp: (!) 24 20 18  (!) 21  Temp:      TempSrc:      SpO2: 100% 100% 100% 100%  Weight:      Height:          Weight change:    Intake/Output Summary (Last 24 hours) at 07/16/2021 1108 Last data filed at 07/16/2021 0630 Gross per 24 hour  Intake 100 ml  Output --  Net 100 ml   BP 125/64    Pulse 71    Temp 98.7 F (37.1 C) (Oral)    Resp (!) 21    Ht 5' 1"  (1.549 m)    Wt 54 kg    SpO2 100%    BMI 22.48 kg/m  General appearance: alert, cooperative, and no distress Head: Normocephalic, without obvious abnormality, atraumatic Resp: clear to auscultation bilaterally Cardio: regular rate and rhythm, S1, S2 normal, no murmur, click, rub or gallop GI: soft, non-tender; bowel sounds normal; no masses,  no organomegaly Extremities: extremities normal, atraumatic, no cyanosis or edema and LUE AVF +T/B Dialysis Access:  Dialysis Orders:  [facility] - MWF-  3.5 hours  EDW 54. HD Bath 2k/2.5 ca, Cellent 17H KOA, Heparin none. Access LUE AVG BFR 300, DFR 500- 15 guage. Micera 200 mcg IV every 2 weeks  Assessment/Plan:   Chest pain - elevated troponins and known underlying CAD with poor targets for PCI.  ECHO pending, cardiology following.  Tachycardia - aflutter vs SVT improved rate with amiodarone and lopressor.  ESRD -  will plan for HD today if remains hemodynamically stable  Hypertension/volume  - stable at this time no evidence of volume overload.  Anemia  - stable, follow and transfuse for Hgb <5.4  Metabolic bone disease -  resume home meds  Nutrition - renal diet.  Donetta Potts, MD [place] Pager [phone] 07/16/2021, 11:08 AM

## 2021-07-17 ENCOUNTER — Inpatient Hospital Stay (HOSPITAL_COMMUNITY): Payer: Medicare HMO

## 2021-07-17 DIAGNOSIS — K5520 Angiodysplasia of colon without hemorrhage: Secondary | ICD-10-CM | POA: Diagnosis not present

## 2021-07-17 DIAGNOSIS — I10 Essential (primary) hypertension: Secondary | ICD-10-CM

## 2021-07-17 DIAGNOSIS — N186 End stage renal disease: Secondary | ICD-10-CM | POA: Diagnosis not present

## 2021-07-17 DIAGNOSIS — K219 Gastro-esophageal reflux disease without esophagitis: Secondary | ICD-10-CM

## 2021-07-17 DIAGNOSIS — I209 Angina pectoris, unspecified: Secondary | ICD-10-CM | POA: Diagnosis not present

## 2021-07-17 DIAGNOSIS — I200 Unstable angina: Secondary | ICD-10-CM

## 2021-07-17 LAB — CBC
HCT: 21.5 % — ABNORMAL LOW (ref 36.0–46.0)
Hemoglobin: 6.5 g/dL — CL (ref 12.0–15.0)
MCH: 38.2 pg — ABNORMAL HIGH (ref 26.0–34.0)
MCHC: 30.2 g/dL (ref 30.0–36.0)
MCV: 126.5 fL — ABNORMAL HIGH (ref 80.0–100.0)
Platelets: 69 10*3/uL — ABNORMAL LOW (ref 150–400)
RBC: 1.7 MIL/uL — ABNORMAL LOW (ref 3.87–5.11)
RDW: 16.9 % — ABNORMAL HIGH (ref 11.5–15.5)
WBC: 5.1 10*3/uL (ref 4.0–10.5)
nRBC: 0 % (ref 0.0–0.2)

## 2021-07-17 LAB — BASIC METABOLIC PANEL
Anion gap: 8 (ref 5–15)
BUN: 25 mg/dL — ABNORMAL HIGH (ref 8–23)
CO2: 29 mmol/L (ref 22–32)
Calcium: 8.8 mg/dL — ABNORMAL LOW (ref 8.9–10.3)
Chloride: 100 mmol/L (ref 98–111)
Creatinine, Ser: 4.43 mg/dL — ABNORMAL HIGH (ref 0.44–1.00)
GFR, Estimated: 9 mL/min — ABNORMAL LOW (ref >60)
Glucose, Bld: 93 mg/dL (ref 70–99)
Potassium: 4.5 mmol/L (ref 3.5–5.1)
Sodium: 137 mmol/L (ref 135–145)

## 2021-07-17 LAB — PREPARE RBC (CROSSMATCH)

## 2021-07-17 LAB — MRSA NEXT GEN BY PCR, NASAL: MRSA by PCR Next Gen: NOT DETECTED

## 2021-07-17 LAB — PHOSPHORUS: Phosphorus: 3.7 mg/dL (ref 2.5–4.6)

## 2021-07-17 LAB — TROPONIN I (HIGH SENSITIVITY): Troponin I (High Sensitivity): 251 ng/L (ref <18)

## 2021-07-17 LAB — HEPARIN LEVEL (UNFRACTIONATED): Heparin Unfractionated: 0.22 IU/mL — ABNORMAL LOW (ref 0.30–0.70)

## 2021-07-17 IMAGING — DX DG CHEST 1V PORT
1 series · 1 of 1 positions shown · non-contrast
Comparison: Chest CT [DATE] and earlier.

CLINICAL DATA: 80-year-old female with chest pain.

EXAM:
PORTABLE CHEST 1 VIEW

[chest ap]
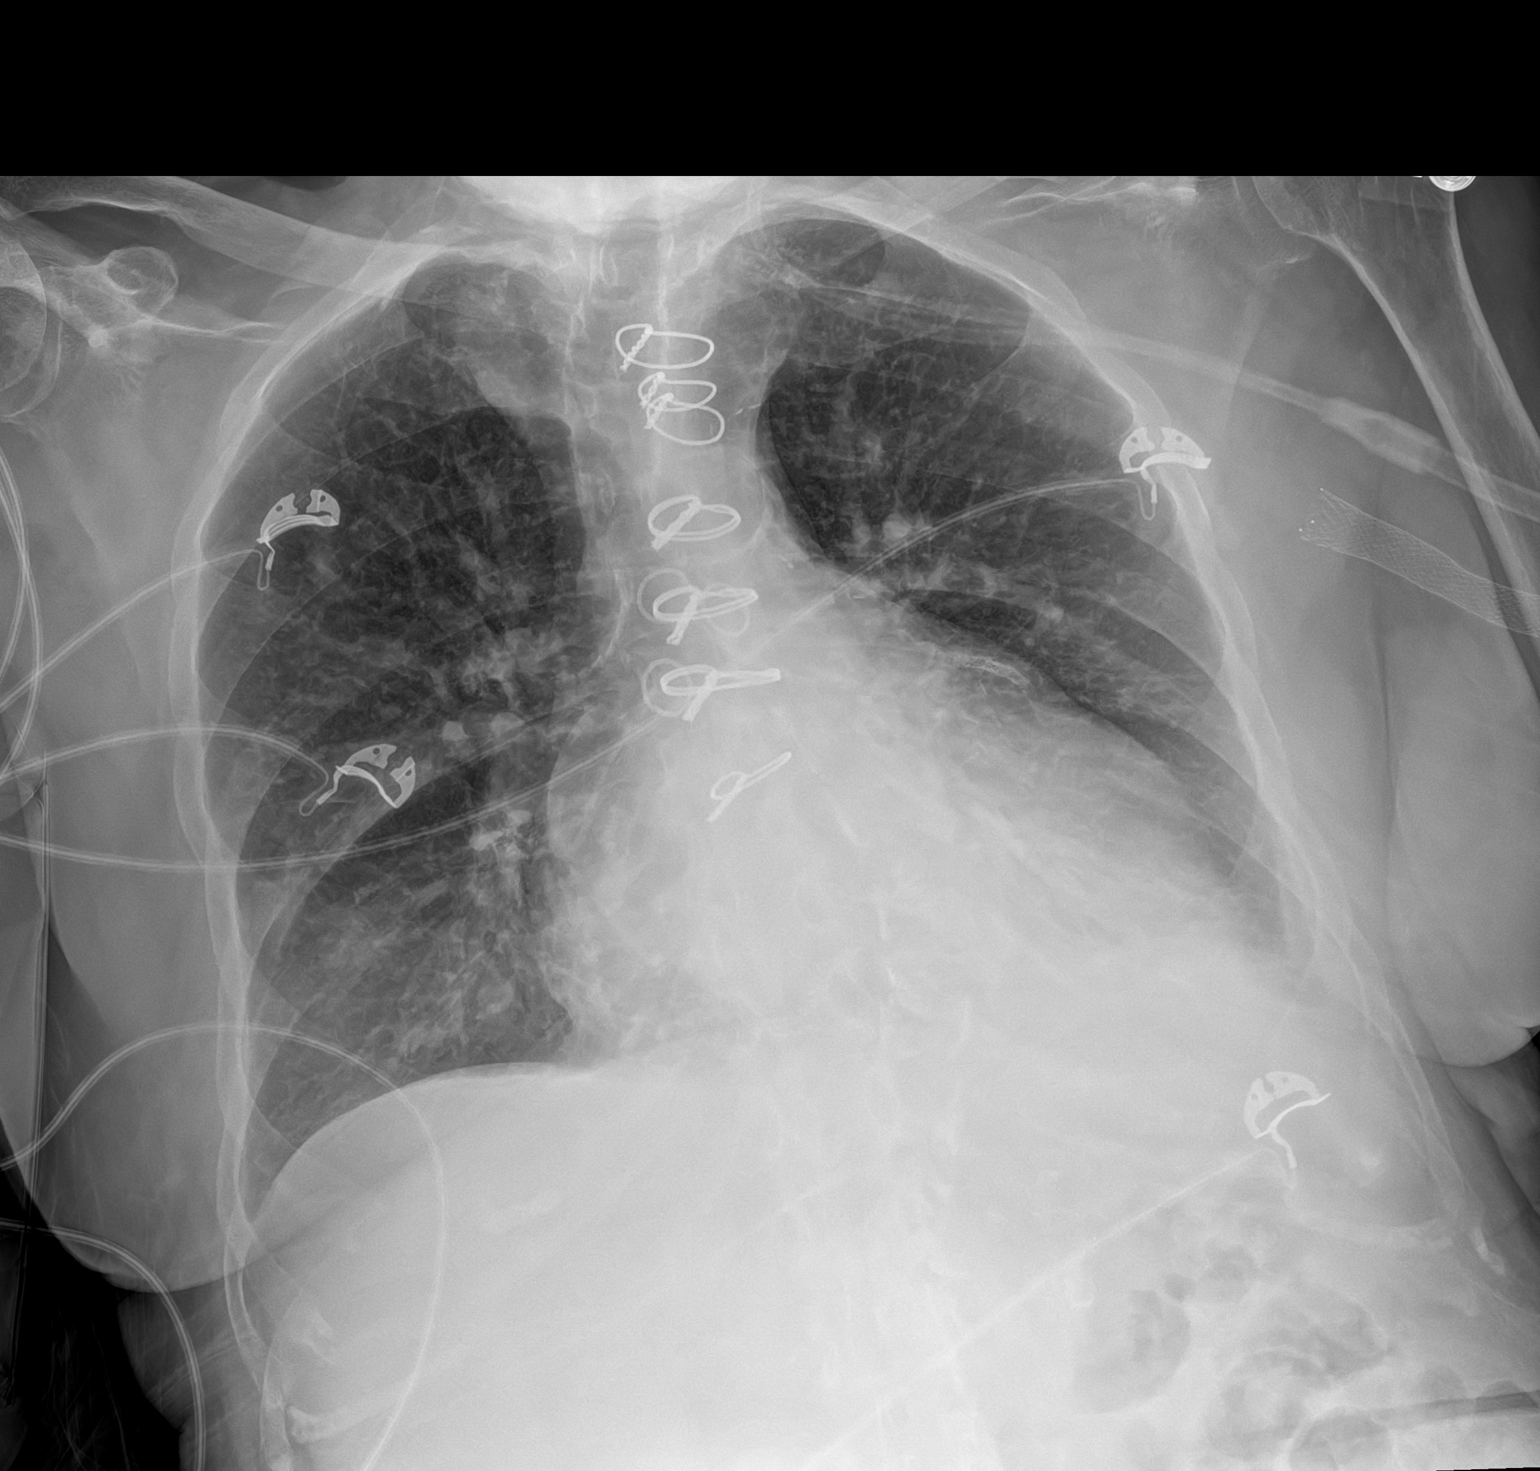

[1 of 1 positions shown; findings below may reference images not displayed]

FINDINGS: Portable AP upright view at [KG] hours. Prior CABG. Calcified aortic
atherosclerosis. Larger lung volumes compared to [DATE]. Stable
cardiac size and mediastinal contours. Mild diffuse increased
pulmonary interstitial opacity does not appear significantly changed
from [facility] radiographs. Small pleural effusion remains
appearance. No pneumothorax, consolidation, or areas of worsening
ventilation. Right lung base ventilation has mildly improved from 2
days ago.

Negative visible bowel gas. No acute osseous abnormality identified.
IMPRESSION: Larger lung volumes with mildly improved basilar ventilation from 2
days ago. Stable cardiomegaly, small left pleural effusion, and
nonspecific mild diffuse increased interstitial opacity.

## 2021-07-17 MED ORDER — SODIUM CHLORIDE 0.9% IV SOLUTION: Freq: Once | INTRAVENOUS | Status: AC

## 2021-07-17 NOTE — Progress Notes (Signed)
ANTICOAGULATION CONSULT NOTE - Follow Up Consult  Pharmacy Consult for heparin Indication:  elevated troponin    Labs: Recent Labs    07/15/21 2336 07/16/21 0151 07/16/21 1901 07/16/21 2120 07/16/21 2315 07/17/21 0401  HGB 8.1*  --   --   --   --  6.5*  HCT 26.1*  --   --   --   --  21.5*  PLT PLATELET CLUMPS NOTED ON SMEAR, COUNT APPEARS DECREASED  --   --   --   --  69*  HEPARINUNFRC  --   --  0.46  --   --  0.22*  CREATININE 6.22*  --   --   --   --  4.43*  TROPONINIHS 48*   < > 356* 279* 251*  --    < > = values in this interval not displayed.    Assessment/Plan:  81yo female subtherapeutic on heparin after one level at goal, troponins now trending back down. Hgb this am is 6.5 (from 8.1) in setting of chronic anemia in setting of ESRD, Plt down to 69 (last several blood samples have had clumped but previous result 11d ago = Plt 268, Plt usually in 200s to 300s per hx) >> 4T score = intermediate to high probability of HIT.  D/w Dr Clearence Ped who agrees to hold heparin for now while awaiting HIT Ab result.  Currently no signs/sx of bleeding or clotting; RN aware of need to monitor.  Pt states she feels well.  Wynona Neat, PharmD, BCPS  07/17/2021,6:23 AM

## 2021-07-17 NOTE — Progress Notes (Signed)
PROGRESS NOTE   Shawna [name]  XNT:[id number] DOB: [birth year] DOA: 07/15/2021 PCP: [facility]   Chief Complaint  Patient presents with   Chest Pain   Level of care: Stepdown  Brief Admission History:  81 y.o. female with medical history significant for significant coronary artery disease, unstable angina, status post CABG in 2013, hypothyroidism, carotid artery disease, end-stage renal disease on hemodialysis, chronic diastolic heart failure, history of colon cancer, colonic AVM, osteoarthritis, GERD, multiple hospitalizations for chest pain recently discharged from [facility] on 07/08/2021 after being admitted for chest pain concerning for unstable angina.  During the hospitalization her Imdur was decreased to 30 mg daily due to soft blood pressures.  It was also noted that she had a history of paroxysmal atrial fibrillation.  She had not been anticoagulated secondary to GI bleeding and had been treated with amiodarone and metoprolol.  She was admitted with rising troponins and started on IV heparin infusion by cardiology team.   Assessment & Plan:   Principal Problem:   Ischemic chest pain ([place]) Active Problems:   Atherosclerotic heart disease of native coronary artery without angina pectoris   Essential hypertension, benign   ESRD on hemodialysis (HCC)   HLD (hyperlipidemia)   GERD (gastroesophageal reflux disease)   AVM (arteriovenous malformation) of small bowel, acquired   Anemia in chronic kidney disease   Hx of CABG   Carotid artery disease (HCC)   Unstable angina (HCC)   Chest pain   Chest pain unstable angina NSTEMI - pt was initially placed on IV heparin infusion by cardiology team - unfortunately heparin had to be discontinued to to acute drop in Hg 8.1 to 6.5 - HIT labs obtained - Pt is chest pain free now, no plan to restart heparin at this time - imdur dose increased by cardiology team to 90 mg BID, had been reduced to 30 mg at last hospitalization   - If BP tolerates, will continue current imdur dosing.  - continue rosuvastatin 10 mg daily  - continue aspirin 81 mg daily  - fortunately troponins have plateaued and started trending down - with Hg down to 6.5, ordered to transfuse 1 unit PRBC today, may benefit from additional unit given for HD on 1/9  Anemia in CKD / acute blood loss anemia - heparin discontinued - transfuse 1 unit PRBC  - run slowly over 4 hours - would like to give additional PRBC during HD on 1/9 - CBC in AM  Thrombocytopenia  - samples have been reported as clumped - HIT panel labs ordered - heparin discontinued  HFrEF - volume removal with HD - last HD done on 07/16/21  Paroxysmal A flutter/SVT - continue  home amiodarone 200 mg daily and metoprolol 12.5 mg BID  ESRD on hemodialysis  - HD MWF, last treatment successfully completed 07/16/21 - nephrology team consulted  Viral URI  - complete doxycycline today given low procalcitonin   DVT prophylaxis: SCD Code Status: full  Family Communication: husband updated  Disposition: anticipate DC home  Status is: Inpatient  Remains inpatient appropriate because: IV treatments required, PRBC transfusion   Consultants:  Nephrology Cardiology   Procedures:  Hemodialysis 07/16/21  Antimicrobials:  N/a   Subjective: Pt reports feeling much better today, no chest pain, no shortness of breath, no twitching since being OFF HD.   Objective: Vitals:   07/17/21 0500 07/17/21 0600 07/17/21 0700 07/17/21 0800  BP: (!) 96/28 (!) 122/33  (!) 127/36  Pulse: (!) 56 (!) 58 Marland Kitchen)  58 63  Resp: 15 17 17 18   Temp:      TempSrc:      SpO2: 100% 100% 99% 95%  Weight:      Height:        Intake/Output Summary (Last 24 hours) at 07/17/2021 1008 Last data filed at 07/17/2021 0900 Gross per 24 hour  Intake 991.53 ml  Output 2122 ml  Net -1130.47 ml   Filed Weights   07/15/21 2252 07/16/21 1430 07/16/21 1915  Weight: 54 kg 54.1 kg 56.8 kg     Examination:  General exam: frail, elderly female, poor short term recall, pleasant, cooperative, NAD.   Respiratory system: no rales or crackles heard. Respiratory effort normal. Cardiovascular system: normal S1 & S2 heard. Trace pretibial edema. Gastrointestinal system: Abdomen is nondistended, soft and nontender. No organomegaly or masses felt. Normal bowel sounds heard. Central nervous system: Alert and oriented. No focal neurological deficits. Extremities: Symmetric 5 x 5 power. Skin: No rashes, lesions or ulcers Psychiatry: Judgement and insight appear normal. Mood & affect appropriate.   Data Reviewed: I have personally reviewed following labs and imaging studies  CBC: Recent Labs  Lab 07/15/21 2336 07/17/21 0401  WBC 9.0 5.1  HGB 8.1* 6.5*  HCT 26.1* 21.5*  MCV 127.3* 126.5*  PLT PLATELET CLUMPS NOTED ON SMEAR, COUNT APPEARS DECREASED 69*    Basic Metabolic Panel: Recent Labs  Lab 07/15/21 2336 07/17/21 0401  NA 139 137  K 4.8 4.5  CL 101 100  CO2 24 29  GLUCOSE 154* 93  BUN 36* 25*  CREATININE 6.22* 4.43*  CALCIUM 9.1 8.8*  PHOS  --  3.7    GFR: Estimated Creatinine Clearance: 7.5 mL/min (A) (by C-G formula based on SCr of 4.43 mg/dL (H)).  Liver Function Tests: No results for input(s): AST, ALT, ALKPHOS, BILITOT, PROT, ALBUMIN in the last 168 hours.  CBG: No results for input(s): GLUCAP in the last 168 hours.  Recent Results (from the past 240 hour(s))  Resp Panel by RT-PCR (Flu A&B, Covid) Nasopharyngeal Swab     Status: None   Collection Time: 07/16/21  4:11 AM   Specimen: Nasopharyngeal Swab; Nasopharyngeal(NP) swabs in vial transport medium  Result Value Ref Range Status   SARS Coronavirus 2 by RT PCR NEGATIVE NEGATIVE Final    Comment: (NOTE) SARS-CoV-2 target nucleic acids are NOT DETECTED.  The SARS-CoV-2 RNA is generally detectable in upper respiratory specimens during the acute phase of infection. The lowest concentration of  SARS-CoV-2 viral copies this assay can detect is 138 copies/mL. A negative result does not preclude SARS-Cov-2 infection and should not be used as the sole basis for treatment or other patient management decisions. A negative result may occur with  improper specimen collection/handling, submission of specimen other than nasopharyngeal swab, presence of viral mutation(s) within the areas targeted by this assay, and inadequate number of viral copies(<138 copies/mL). A negative result must be combined with clinical observations, patient history, and epidemiological information. The expected result is Negative.  Fact Sheet for Patients:  EntrepreneurPulse.com.au  Fact Sheet for Healthcare Providers:  IncredibleEmployment.be  This test is no t yet approved or cleared by the [place] FDA and  has been authorized for detection and/or diagnosis of SARS-CoV-2 by FDA under an Emergency Use Authorization (EUA). This EUA will remain  in effect (meaning this test can be used) for the duration of the COVID-19 declaration under Section 564(b)(1) of the Act, 21 U.S.C.section 360bbb-3(b)(1), unless the authorization is terminated  or  revoked sooner.       Influenza A by PCR NEGATIVE NEGATIVE Final   Influenza B by PCR NEGATIVE NEGATIVE Final    Comment: (NOTE) The Xpert Xpress SARS-CoV-2/FLU/RSV plus assay is intended as an aid in the diagnosis of influenza from Nasopharyngeal swab specimens and should not be used as a sole basis for treatment. Nasal washings and aspirates are unacceptable for Xpert Xpress SARS-CoV-2/FLU/RSV testing.  Fact Sheet for Patients: EntrepreneurPulse.com.au  Fact Sheet for Healthcare Providers: IncredibleEmployment.be  This test is not yet approved or cleared by the [place] FDA and has been authorized for detection and/or diagnosis of SARS-CoV-2 by FDA under an Emergency Use  Authorization (EUA). This EUA will remain in effect (meaning this test can be used) for the duration of the COVID-19 declaration under Section 564(b)(1) of the Act, 21 U.S.C. section 360bbb-3(b)(1), unless the authorization is terminated or revoked.  Performed at [facility], [address], [place], [place] [address]   Culture, blood (Routine X 2) w Reflex to ID Panel     Status: None (Preliminary result)   Collection Time: 07/16/21  6:31 AM   Specimen: BLOOD RIGHT HAND  Result Value Ref Range Status   Specimen Description BLOOD RIGHT HAND  Final   Special Requests   Final    BOTTLES DRAWN AEROBIC ONLY Blood Culture results may not be optimal due to an inadequate volume of blood received in culture bottles   Culture   Final    NO GROWTH < 24 HOURS Performed at [facility], [address], [place], [place] [address]    Report Status PENDING  Incomplete  Culture, blood (Routine X 2) w Reflex to ID Panel     Status: None (Preliminary result)   Collection Time: 07/16/21  6:31 AM   Specimen: Right Antecubital; Blood  Result Value Ref Range Status   Specimen Description RIGHT ANTECUBITAL  Final   Special Requests   Final    BOTTLES DRAWN AEROBIC ONLY Blood Culture adequate volume   Culture   Final    NO GROWTH < 24 HOURS Performed at [facility], [address], [place], [place] [address]    Report Status PENDING  Incomplete  Culture, blood (routine x 2)     Status: None (Preliminary result)   Collection Time: 07/16/21  5:40 PM   Specimen: Porta Cath; Blood  Result Value Ref Range Status   Specimen Description PORTA CATH ARTERIAL AVF  Final   Special Requests   Final    BOTTLES DRAWN AEROBIC AND ANAEROBIC Blood Culture adequate volume   Culture   Final    NO GROWTH < 12 HOURS Performed at [facility], [address] [place], [place] [address]    Report Status PENDING  Incomplete  Culture, blood (routine x 2)     Status: None (Preliminary result)   Collection Time: 07/16/21   5:55 PM   Specimen: Porta Cath; Blood  Result Value Ref Range Status   Specimen Description PORTA CATH VENOUS AVF  Final   Special Requests   Final    BOTTLES DRAWN AEROBIC AND ANAEROBIC Blood Culture adequate volume   Culture   Final    NO GROWTH < 12 HOURS Performed at [facility], [address], [place], [place] [address]    Report Status PENDING  Incomplete  MRSA Next Gen by PCR, Nasal     Status: None   Collection Time: 07/16/21  7:14 PM   Specimen: Nasal Mucosa; Nasal Swab  Result  Value Ref Range Status   MRSA by PCR Next Gen NOT DETECTED NOT DETECTED Final    Comment: (NOTE) The GeneXpert MRSA Assay (FDA approved for NASAL specimens only), is one component of a comprehensive MRSA colonization surveillance program. It is not intended to diagnose MRSA infection nor to guide or monitor treatment for MRSA infections. Test performance is not FDA approved in patients less than 81 years old. Performed at [facility], [address] [place], [place] [address]      Radiology Studies: DG Chest 2 View  Result Date: 07/15/2021 CLINICAL DATA:  Central chest pain. EXAM: CHEST - 2 VIEW COMPARISON:  July 06, 2021 FINDINGS: Multiple sternal wires and vascular clips are seen. Mild, stable, diffusely increased lung markings are noted. Mild atelectasis is seen within the bilateral lung bases. Small bilateral pleural effusions are present. No pneumothorax is identified. There is mild to moderate severity enlargement of the cardiac silhouette. This is increased in severity when compared to the prior study. A coronary artery stent is noted. A radiopaque vascular stent is seen within the soft tissues of the proximal left upper extremity. Multilevel degenerative changes seen throughout the thoracic spine. IMPRESSION: 1. Evidence of prior median sternotomy/CABG. 2. Mild to moderate severity enlargement of the cardiac silhouette which represents a new finding when compared to the exam. Interval  development of a pericardial effusion cannot be excluded. Correlation with chest CT is recommended. 3. Mild bibasilar atelectasis. 4. Small bilateral pleural effusions. Electronically Signed   By: Virgina Norfolk M.D.   On: 07/15/2021 23:33   CT Chest Wo Contrast  Result Date: 07/16/2021 CLINICAL DATA:  81 year old female with chest pain. Pleural effusion. EXAM: CT CHEST WITHOUT CONTRAST TECHNIQUE: Multidetector CT imaging of the chest was performed following the standard protocol without IV contrast. COMPARISON:  Chest radiographs 07/15/2021. [facility] Noncontrast CT 02/26/2018. CT Abdomen and Pelvis 04/06/2020. FINDINGS: Cardiovascular: Prior CABG. Extensive calcified coronary artery and Calcified aortic atherosclerosis. Mild cardiomegaly. No pericardial effusion. Vascular patency is not evaluated in the absence of IV contrast. Mediastinum/Nodes: Stable. Upper limits of normal to mildly reactive appearing mediastinal lymph nodes have not significantly changed since 2019. Lungs/Pleura: Small layering pleural effusions, trace on the left. Major airways are patent. However, there is pronounced bilateral lower lobe and right middle lobe bronchial wall thickening. Somewhat generalized increased pulmonary septal thickening also, and occasional areas of vague peribronchial ground-glass opacity (left lung series 4, image 42 right lung image 54). Superimposed chronic lung scarring. Confluent opacity in the costophrenic angles indeterminate for atelectasis versus infection. Upper Abdomen: Severe Calcified aortic atherosclerosis. Bilateral renal atrophy. Diverticulosis of the visible large bowel. Negative visible noncontrast liver, spleen, pancreas and stomach. Musculoskeletal: Prior sternotomy. Exaggerated thoracic kyphosis. Widespread advanced thoracic disc degeneration. Mild L1 superior endplate compression is new since 2019 but was probably acute last year. No acute osseous abnormality identified.  IMPRESSION: 1. Severe Aortic Atherosclerosis (ICD10-I70.0) with prior CABG. Mild cardiomegaly. 2. Small layering pleural effusions (trace on the left) with generalized pulmonary septal thickening and vague asymmetric pulmonary ground-glass opacity new from 2019. Suspect pulmonary interstitial edema, however, there is also new bilateral lower lobe and right middle lobe bronchial wall thickening with confluent lung opacity in both costophrenic angles - suspicious for superimposed acute infection. Alternatively, the entire constellation might reflect a viral respiratory illness. 3. Mild L1 superior endplate compression fracture is new since 2019 but was probably chronic. Electronically Signed   By: Genevie Ann M.D.   On: 07/16/2021 05:22  Portable chest 1 View  Result Date: 07/17/2021 CLINICAL DATA:  81 year old female with chest pain. EXAM: PORTABLE CHEST 1 VIEW COMPARISON:  Chest CT 07/16/2021 and earlier. FINDINGS: Portable AP upright view at 0402 hours. Prior CABG. Calcified aortic atherosclerosis. Larger lung volumes compared to 07/15/2021. Stable cardiac size and mediastinal contours. Mild diffuse increased pulmonary interstitial opacity does not appear significantly changed from December radiographs. Small pleural effusion remains appearance. No pneumothorax, consolidation, or areas of worsening ventilation. Right lung base ventilation has mildly improved from 2 days ago. Negative visible bowel gas. No acute osseous abnormality identified. IMPRESSION: Larger lung volumes with mildly improved basilar ventilation from 2 days ago. Stable cardiomegaly, small left pleural effusion, and nonspecific mild diffuse increased interstitial opacity. Electronically Signed   By: Genevie Ann M.D.   On: 07/17/2021 04:39   ECHOCARDIOGRAM COMPLETE  Result Date: 07/16/2021    ECHOCARDIOGRAM REPORT   Patient Name:   Shawna [name] Date of Exam: 07/16/2021 Medical Rec #:  [id number]    Height:       61.0 in Accession #:    [id number]    Weight:       119.0 lb Date of Birth:  1940-11-29    BSA:          1.515 m Patient Age:    33 years     BP:           128/61 mmHg Patient Gender: F            HR:           76 bpm. Exam Location:  [facility] Procedure: 2D Echo, Cardiac Doppler and Color Doppler Indications:    Chest Pain  History:        Patient has prior history of Echocardiogram examinations, most                 recent 03/15/2021. CAD and Previous Myocardial Infarction, Prior                 CABG, Arrythmias:LBBB, Signs/Symptoms:Dyspnea; Risk                 Factors:Hypertension and Dyslipidemia.  Sonographer:    Wenda Low Referring Phys: [id number] [place]  1. Left ventricular ejection fraction, by estimation, is 30 to 35%. The left ventricle has moderately decreased function. The left ventricle demonstrates regional wall motion abnormalities (see scoring diagram/findings for description). There is mild left ventricular hypertrophy. Left ventricular diastolic parameters are consistent with Grade II diastolic dysfunction (pseudonormalization). Elevated left atrial pressure.  2. Right ventricular systolic function is moderately reduced. The right ventricular size is normal. There is mildly elevated pulmonary artery systolic pressure. The estimated right ventricular systolic pressure is 34.7 mmHg.  3. Left atrial size was severely dilated.  4. Right atrial size was mildly dilated.  5. The mitral valve is degenerative. Moderate mitral valve regurgitation. Mild to moderate mitral stenosis. MG 59mmHg at 70bpm, MVA 1.2 cm^2 by continuity equation. Moderate mitral annular calcification.  6. Tricuspid valve regurgitation is mild to moderate.  7. The aortic valve is tricuspid. Aortic valve regurgitation is not visualized. Aortic valve sclerosis/calcification is present, without any evidence of aortic stenosis.  8. The inferior vena cava is normal in size with greater than 50% respiratory variability, suggesting right atrial pressure  of 3 mmHg. FINDINGS  Left Ventricle: Left ventricular ejection fraction, by estimation, is 30 to 35%. The left ventricle has moderately decreased function. The left ventricle demonstrates regional  wall motion abnormalities. The left ventricular internal cavity size was normal in size. There is mild left ventricular hypertrophy. Left ventricular diastolic parameters are consistent with Grade II diastolic dysfunction (pseudonormalization). Elevated left atrial pressure.  LV Wall Scoring: The entire anterior wall, entire anterior septum, mid inferoseptal segment, and apex are akinetic. The apical lateral segment and mid anterolateral segment are hypokinetic. The entire inferior wall, posterior wall, basal anterolateral segment, and basal inferoseptal segment are normal. Right Ventricle: The right ventricular size is normal. Right vetricular wall thickness was not well visualized. Right ventricular systolic function is moderately reduced. There is mildly elevated pulmonary artery systolic pressure. The tricuspid regurgitant velocity is 3.18 m/s, and with an assumed right atrial pressure of 3 mmHg, the estimated right ventricular systolic pressure is 16.1 mmHg. Left Atrium: Left atrial size was severely dilated. Right Atrium: Right atrial size was mildly dilated. Pericardium: Trivial pericardial effusion is present. Mitral Valve: The mitral valve is degenerative in appearance. Moderate mitral annular calcification. Moderate mitral valve regurgitation. Mild to moderate mitral valve stenosis. MV peak gradient, 14.3 mmHg. The mean mitral valve gradient is 5.0 mmHg. Tricuspid Valve: The tricuspid valve is normal in structure. Tricuspid valve regurgitation is mild to moderate. Aortic Valve: The aortic valve is tricuspid. Aortic valve regurgitation is not visualized. Aortic valve sclerosis/calcification is present, without any evidence of aortic stenosis. Aortic valve mean gradient measures 4.0 mmHg. Aortic valve peak gradient  measures 8.6 mmHg. Aortic valve area, by VTI measures 1.60 cm. Pulmonic Valve: The pulmonic valve was not well visualized. Pulmonic valve regurgitation is trivial. Aorta: The aortic root and ascending aorta are structurally normal, with no evidence of dilitation. Venous: The inferior vena cava is normal in size with greater than 50% respiratory variability, suggesting right atrial pressure of 3 mmHg. IAS/Shunts: The interatrial septum was not well visualized.  LEFT VENTRICLE PLAX 2D LVIDd:         4.00 cm     Diastology LVIDs:         3.40 cm     LV e' medial:    3.75 cm/s LV PW:         1.20 cm     LV E/e' medial:  36.0 LV IVS:        1.20 cm     LV e' lateral:   5.63 cm/s LVOT diam:     1.90 cm     LV E/e' lateral: 24.0 LV SV:         52 LV SV Index:   35 LVOT Area:     2.84 cm  LV Volumes (MOD) LV vol d, MOD A2C: 53.9 ml LV vol d, MOD A4C: 52.6 ml LV vol s, MOD A2C: 36.8 ml LV vol s, MOD A4C: 32.7 ml LV SV MOD A2C:     17.1 ml LV SV MOD A4C:     52.6 ml LV SV MOD BP:      17.8 ml RIGHT VENTRICLE RV Basal diam:  3.90 cm RV Mid diam:    3.30 cm RV S prime:     6.23 cm/s TAPSE (M-mode): 1.5 cm LEFT ATRIUM             Index        RIGHT ATRIUM           Index LA diam:        4.40 cm 2.90 cm/m   RA Area:     16.50 cm LA Vol (A2C):   80.8 ml  53.34 ml/m  RA Volume:   46.10 ml  30.43 ml/m LA Vol (A4C):   72.0 ml 47.53 ml/m LA Biplane Vol: 78.6 ml 51.89 ml/m  AORTIC VALVE                    PULMONIC VALVE AV Area (Vmax):    1.33 cm     PV Vmax:       0.60 m/s AV Area (Vmean):   1.48 cm     PV Peak grad:  1.4 mmHg AV Area (VTI):     1.60 cm AV Vmax:           147.00 cm/s AV Vmean:          93.450 cm/s AV VTI:            0.328 m AV Peak Grad:      8.6 mmHg AV Mean Grad:      4.0 mmHg LVOT Vmax:         69.20 cm/s LVOT Vmean:        48.900 cm/s LVOT VTI:          0.185 m LVOT/AV VTI ratio: 0.56  AORTA Ao Root diam: 2.70 cm Ao Asc diam:  2.50 cm MITRAL VALVE                  TRICUSPID VALVE MV Area (PHT): 3.54 cm        TR Peak grad:   40.4 mmHg MV Area VTI:   1.21 cm       TR Vmax:        318.00 cm/s MV Peak grad:  14.3 mmHg MV Mean grad:  5.0 mmHg       SHUNTS MV Vmax:       1.89 m/s       Systemic VTI:  0.18 m MV Vmean:      95.3 cm/s      Systemic Diam: 1.90 cm MV Decel Time: 214 msec MR Peak grad:    100.0 mmHg MR Mean grad:    65.0 mmHg MR Vmax:         500.00 cm/s MR Vmean:        377.0 cm/s MR PISA:         1.57 cm MR PISA Eff ROA: 29 mm MR PISA Radius:  0.50 cm MV E velocity: 135.00 cm/s MV A velocity: 79.30 cm/s MV E/A ratio:  1.70 Oswaldo Milian MD Electronically signed by Oswaldo Milian MD Signature Date/Time: 07/16/2021/6:25:53 PM    Final     Scheduled Meds:  sodium chloride   Intravenous Once   ALPRAZolam  0.5 mg Oral QHS   amiodarone  200 mg Oral Daily   aspirin EC  81 mg Oral Daily   Chlorhexidine Gluconate Cloth  6 each Topical Q0600   doxycycline  100 mg Oral Q12H   isosorbide mononitrate  90 mg Oral BID   levothyroxine  25 mcg Oral Daily   metoprolol tartrate  12.5 mg Oral BID   midodrine  5 mg Oral Q M,W,F-HD   multivitamin  1 tablet Oral Daily   rosuvastatin  10 mg Oral Daily   sevelamer carbonate  2,400 mg Oral TID WC   Continuous Infusions:  sodium chloride     sodium chloride     azithromycin 500 mg (07/17/21 0600)   cefTRIAXone (ROCEPHIN)  IV 1 g (07/17/21 0520)     LOS: 1 day   Time spent: 39 mins  Irwin Brakeman, MD How to contact the [facility] Attending or Consulting provider [place] or covering provider during after hours [place], for this patient?  Check the care team in [facility] and look for a) attending/consulting [facility] provider listed and b) the [facility] team listed Log into [email or web address] and use [place]'s universal password to access. If you do not have the password, please contact the hospital operator. Locate the [facility] provider you are looking for under [facility] and page to a number that you can be directly reached. If you still have difficulty  reaching the provider, please page the [facility] (Director on Call) for the Hospitalists listed on amion for assistance.  07/17/2021, 10:08 AM

## 2021-07-17 NOTE — Progress Notes (Signed)
Pt belongings gathered & pt transferred to 3rd floor room 301 at this time. Pt's husband notified of room change, report given to [place], Therapist, sports.

## 2021-07-17 NOTE — TOC Initial Note (Signed)
Transition of Care [facility]) - Initial/Assessment Note    Patient Details  Name: Shawna [name] MRN: [id number] Date of Birth: 09/27/1940  Transition of Care [facility]) CM/SW Contact:    Kerin Salen, RN Phone Number: 07/17/2021, 4:32 PM  Clinical Narrative:   Transition of Care (TOC) Screening Note   Patient Details  Name: Shawna [name] Date of Birth: 1941/01/10   Transition of Care [facility]) CM/SW Contact:    Kerin Salen, RN Phone Number: 07/17/2021, 4:32 PM    Transition of Care Department [facility]) has reviewed patient and no TOC needs have been identified at this time. We will continue to monitor patient advancement through interdisciplinary progression rounds. If new patient transition needs arise, please place a TOC consult.                         Patient Goals and CMS Choice        Expected Discharge Plan and Services                                                Prior Living Arrangements/Services                       Activities of Daily Living Home Assistive Devices/Equipment: Wheelchair, Environmental consultant (specify type), Cane (specify quad or straight) ADL Screening (condition at time of admission) Patient's cognitive ability adequate to safely complete daily activities?: Yes Is the patient deaf or have difficulty hearing?: No Does the patient have difficulty seeing, even when wearing glasses/contacts?: No Does the patient have difficulty concentrating, remembering, or making decisions?: No Patient able to express need for assistance with ADLs?: Yes Does the patient have difficulty dressing or bathing?: No Independently performs ADLs?: No Communication: Independent Dressing (OT): Independent Grooming: Independent Feeding: Independent Bathing: Independent Toileting: Independent with device (comment) In/Out Bed: Independent with device (comment) Walks in Home: Independent with device (comment) Does the patient have difficulty walking or climbing  stairs?: Yes Weakness of Legs: Both Weakness of Arms/Hands: None  Permission Sought/Granted                  Emotional Assessment              Admission diagnosis:  Pulmonary edema [J81.1] Chest pain [R07.9] Acute respiratory failure, unspecified whether with hypoxia or hypercapnia (HCC) [J96.00] Chest pain, unspecified type [R07.9] Patient Active Problem List   Diagnosis Date Noted   Pulmonary edema 07/16/2021   Atrial fibrillation ([place]) 05/07/2021   Vitamin B deficiency 04/23/2021   Neuropathy 04/23/2021   Anemia due to chronic blood loss 04/13/2021   Left knee pain 03/15/2021   Moderate protein-calorie malnutrition ([place]) 02/27/2021   Hypokalemia 02/27/2021   COVID-19 virus infection 02/03/2021   Leukocytosis 02/03/2021   Elevated MCV 02/03/2021   Hypoalbuminemia due to protein-calorie malnutrition ([place]) 02/03/2021   Hypothyroidism (acquired) 02/03/2021   Myositis 12/03/2020   Ataxia 12/02/2020   Diabetes mellitus type 2 in nonobese ([place]) 11/10/2020   Acute pulmonary edema ([place]) 11/10/2020   Generalized weakness 11/09/2020   Dialysis AV fistula malfunction, initial encounter ([place])    Jugular vein occlusion, right (HCC)    Failure of surgically constructed arteriovenous fistula ([place]) 10/03/2020   Myoclonus 08/31/2020   Clotted renal dialysis AV graft, initial encounter ([place])    Hypertensive heart and  chronic kidney disease with heart failure and stage 1 through stage 4 chronic kidney disease, or chronic kidney disease ([place])    Hemodialysis-associated hypotension    Acute hypoxemic respiratory failure ([place]) 06/14/2020   Hypertension 06/12/2020   Irritable bowel syndrome 02/25/2020   Adenomatous duodenal polyp 09/10/2019   History of GI bleed 09/10/2019   Angina pectoris ([place]) 06/05/2019   Chest pain 06/03/2019   Small intestinal bacterial overgrowth 05/14/2019   Iron deficiency anemia 04/02/2019   Acute respiratory failure with hypoxia (HCC) 12/25/2018    Elevated troponin 12/14/2018   Chest pain at rest 07/13/2018   Hand steal syndrome ([place]) 08/01/2017   Anemia 07/14/2017   Coronary artery disease 06/05/2017   Mesenteric ischemia (HCC)    Diverticulitis    Chronic diarrhea    Complication of vascular access for dialysis 03/19/2017   Preoperative clearance 01/25/2017   H/O non-ST elevation myocardial infarction (NSTEMI) 10/24/2016   Fluid overload 09/98/3382   Complication from renal dialysis device 10/10/2016   Non-ST elevation MI (NSTEMI) ([place])    Encounter for fitting and adjustment of vascular catheter    Heme positive stool    Demand ischemia ([place]) 07/27/2016   Hypertensive emergency 07/08/2016   Acute on chronic respiratory failure with hypoxia [facility])    Cardiac arrest [facility])    Palliative care encounter    Goals of care, counseling/discussion    Hypertensive crisis without congestive heart failure 05/09/2016   Flash pulmonary edema ([place]) 04/06/2016   Acute respiratory failure ([place]) 04/06/2016   Hypertensive crisis 01/27/2016   History of colon cancer 01/27/2016   History of ovarian cancer 01/27/2016   Hypertensive urgency 01/27/2016   Paroxysmal atrial fibrillation ([place]) 10/14/2015   Coronary angioplasty status 10/14/2015   Malignant neoplasm of right ovary ([place]) 10/14/2015   Narrow complex tachycardia ([place]) 09/08/2015   SVT (supraventricular tachycardia) ([place]) 09/08/2015   Influenza A 08/30/2015   Acute on chronic diastolic CHF (congestive heart failure) ([place]) 05/04/2015   Unstable angina ([place]) 05/03/2015   DOE (dyspnea on exertion)    Essential hypertension    Hyponatremia 10/01/2014   Pain in joint, lower leg 08/14/2014   Dacryocystitis 05/29/2013   Chronic diastolic CHF (congestive heart failure) ([place]) 03/22/2013   Acute post-hemorrhagic anemia 03/21/2013   Occlusion and stenosis of carotid artery without mention of cerebral infarction 01/24/2013   Hx of CABG 07/05/2012   Carotid artery disease ([place]) 07/05/2012    Mitral regurgitation 06/12/2012   Pneumonia 06/09/2012   Non-STEMI (non-ST elevated myocardial infarction) ([place]) 06/08/2012   Ischemic chest pain ([place]) 03/01/2012   AVM (arteriovenous malformation) of small bowel, acquired 01/20/2012   GERD (gastroesophageal reflux disease) 01/09/2012   HLD (hyperlipidemia) 01/05/2012   Atherosclerotic heart disease of native coronary artery without angina pectoris 12/16/2011   Essential hypertension, benign 12/16/2011   ESRD on hemodialysis ([place]) 12/16/2011   Anxiety disorder 05/04/2011   Anemia in chronic kidney disease 04/29/2011   ESRD (end stage renal disease) on dialysis ([place]) 04/29/2011   Gout 04/29/2011   Hypertensive chronic kidney disease with stage 5 chronic kidney disease or end stage renal disease ([place]) 04/29/2011   PCP:  Practice, [place]:   [facility] [address], [place] [address] [place] [address] Phone: [phone] Fax: [phone]  [facility] (ESRD Bundle Only) - [place], [address] [place] [address] Phone: [phone] Fax: [phone]     Social Determinants of Health (  SDOH) Interventions    Readmission Risk Interventions Readmission Risk Prevention Plan 11/11/2020 10/04/2020 09/02/2020  Transportation Screening Complete Complete Complete  PCP or Specialist Appt within 3-5 Days - - -  [place] or [place] for [place] - - -  Medication Review Press photographer) Complete Complete Complete  PCP or Specialist appointment within 3-5 days of discharge Complete Complete -  [place] or [place] Complete Complete Complete  SW Recovery Care/Counseling Consult Complete Complete Complete  Palliative Care Screening Not Applicable Not Applicable Not Applicable  [place] Not Applicable Complete Patient Refused  Some recent data might be  hidden

## 2021-07-17 NOTE — Progress Notes (Signed)
Date and time results received: 07/17/21 0511  Test: hemoglobin Critical Value: 6.5  Name of Provider Notified: Zierle-Ghosh  Orders Received? Or Actions Taken?: no new orders at this time; will notify nephro

## 2021-07-18 DIAGNOSIS — I6523 Occlusion and stenosis of bilateral carotid arteries: Secondary | ICD-10-CM | POA: Diagnosis not present

## 2021-07-18 DIAGNOSIS — R079 Chest pain, unspecified: Secondary | ICD-10-CM

## 2021-07-18 DIAGNOSIS — N186 End stage renal disease: Secondary | ICD-10-CM | POA: Diagnosis not present

## 2021-07-18 DIAGNOSIS — I209 Angina pectoris, unspecified: Secondary | ICD-10-CM | POA: Diagnosis not present

## 2021-07-18 LAB — TYPE AND SCREEN
ABO/RH(D): O POS
Antibody Screen: NEGATIVE
Donor AG Type: NEGATIVE
Unit division: 0

## 2021-07-18 LAB — BPAM RBC
Blood Product Expiration Date: 202302092359
ISSUE DATE / TIME: 202301071750
Unit Type and Rh: 5100

## 2021-07-18 LAB — CBC
HCT: 25.7 % — ABNORMAL LOW (ref 36.0–46.0)
Hemoglobin: 8 g/dL — ABNORMAL LOW (ref 12.0–15.0)
MCH: 36 pg — ABNORMAL HIGH (ref 26.0–34.0)
MCHC: 31.1 g/dL (ref 30.0–36.0)
MCV: 115.8 fL — ABNORMAL HIGH (ref 80.0–100.0)
Platelets: 68 10*3/uL — ABNORMAL LOW (ref 150–400)
RBC: 2.22 MIL/uL — ABNORMAL LOW (ref 3.87–5.11)
WBC: 6.2 10*3/uL (ref 4.0–10.5)
nRBC: 0 % (ref 0.0–0.2)

## 2021-07-18 MED ORDER — DIPHENHYDRAMINE HCL 50 MG/ML IJ SOLN
12.5000 mg | Freq: Four times a day (QID) | INTRAMUSCULAR | Status: DC | PRN
  Administered 2021-07-18 – 2021-07-21 (×3): 12.5 mg via INTRAVENOUS
  Filled 2021-07-18 (×4): qty 1

## 2021-07-18 MED ORDER — CHLORHEXIDINE GLUCONATE CLOTH 2 % EX PADS
6.0000 | MEDICATED_PAD | Freq: Every day | CUTANEOUS | Status: DC
  Administered 2021-07-20 – 2021-07-22 (×3): 6 via TOPICAL

## 2021-07-18 NOTE — Progress Notes (Signed)
PROGRESS NOTE   Shawna [name]  GMW:[id number] DOB: 06-Mar-1941 DOA: 07/15/2021 PCP: Practice, [facility]   Chief Complaint  Patient presents with   Chest Pain   Level of care: Telemetry  Brief Admission History:  81 y.o. female with medical history significant for significant coronary artery disease, unstable angina, status post CABG in 2013, hypothyroidism, carotid artery disease, end-stage renal disease on hemodialysis, chronic diastolic heart failure, history of colon cancer, colonic AVM, osteoarthritis, GERD, multiple hospitalizations for chest pain recently discharged from [facility] on 07/08/2021 after being admitted for chest pain concerning for unstable angina.  During the hospitalization her Imdur was decreased to 30 mg daily due to soft blood pressures.  It was also noted that she had a history of paroxysmal atrial fibrillation.  She had not been anticoagulated secondary to GI bleeding and had been treated with amiodarone and metoprolol.  She was admitted with rising troponins and started on IV heparin infusion by cardiology team.   Assessment & Plan:   Principal Problem:   Ischemic chest pain ([place]) Active Problems:   Atherosclerotic heart disease of native coronary artery without angina pectoris   Essential hypertension, benign   ESRD on hemodialysis (HCC)   HLD (hyperlipidemia)   GERD (gastroesophageal reflux disease)   AVM (arteriovenous malformation) of small bowel, acquired   Anemia in chronic kidney disease   Hx of CABG   Carotid artery disease (HCC)   Unstable angina (HCC)   Chest pain   Chest pain unstable angina NSTEMI - pt was initially placed on IV heparin infusion by cardiology team - unfortunately heparin had to be discontinued to to acute drop in Hg 8.1 to 6.5 - HIT labs obtained - Pt is chest pain free now, no plan to restart heparin at this time - imdur dose increased by cardiology team to 90 mg BID, had been reduced to 30 mg at last hospitalization   - If BP tolerates, will continue current imdur dosing.  - continue rosuvastatin 10 mg daily  - continue aspirin 81 mg daily  - fortunately troponins have plateaued and started trending down - with Hg down to 6.5, transfused 1 unit PRBC 1/7, Hg up to 8.0 - she has done so much better after they increased her imdur to 90 mg BID  Anemia in CKD / acute blood loss anemia - heparin discontinued - transfuse 1 unit PRBC  - run slowly over 4 hours - consider additional PRBC during HD on 1/9 - CBC in AM  Thrombocytopenia  - samples have been reported as clumped - HIT panel labs ordered - heparin discontinued on 07/17/21  HFrEF - volume removal with HD - last HD done on 07/16/21  Paroxysmal A flutter/SVT - continue  home amiodarone 200 mg daily and metoprolol 12.5 mg BID  ESRD on hemodialysis  - HD MWF, last treatment successfully completed 07/16/21 - nephrology team consulted  Viral URI  - complete doxycycline today given low procalcitonin   DVT prophylaxis: SCD Code Status: full  Family Communication: husband updated  Disposition: anticipate DC home in 1-2 days, possibly tomorrow after HD Status is: Inpatient  Remains inpatient appropriate because: IV treatments required, PRBC transfusion   Consultants:  Nephrology Cardiology   Procedures:  Hemodialysis 07/16/21  Antimicrobials:  N/a   Subjective: Pt reports feeling much better after transfusion, no chest pain, no shortness of breath, no twitching since being OFF HD.   Objective: Vitals:   07/18/21 0150 07/18/21 0446 07/18/21 1027 07/18/21 1455  BP: Mar[place] Kitchen)  145/60 (!) 135/50 (!) 130/54 (!) 116/41  Pulse: 66 61 60 (!) 59  Resp: 19 18 18    Temp: 98.4 F (36.9 C) 97.7 F (36.5 C)  98.7 F (37.1 C)  TempSrc: Oral Oral  Oral  SpO2: 99% 100% 98% 96%  Weight:      Height:        Intake/Output Summary (Last 24 hours) at 07/18/2021 1622 Last data filed at 07/18/2021 1300 Gross per 24 hour  Intake 2204 ml  Output --  Net  2204 ml   Filed Weights   07/15/21 2252 07/16/21 1430 07/16/21 1915  Weight: 54 kg 54.1 kg 56.8 kg    Examination:  General exam: frail, elderly female, poor short term recall, pleasant, cooperative, NAD.   Respiratory system: no rales or crackles heard. Respiratory effort normal. Cardiovascular system: normal S1 & S2 heard. Trace pretibial edema. Gastrointestinal system: Abdomen is nondistended, soft and nontender. No organomegaly or masses felt. Normal bowel sounds heard. Central nervous system: Alert and oriented. No focal neurological deficits. Extremities: Symmetric 5 x 5 power. Skin: No rashes, lesions or ulcers Psychiatry: Judgement and insight appear normal. Mood & affect appropriate.   Data Reviewed: I have personally reviewed following labs and imaging studies  CBC: Recent Labs  Lab 07/15/21 2336 07/17/21 0401 07/18/21 0443  WBC 9.0 5.1 6.2  HGB 8.1* 6.5* 8.0*  HCT 26.1* 21.5* 25.7*  MCV 127.3* 126.5* 115.8*  PLT PLATELET CLUMPS NOTED ON SMEAR, COUNT APPEARS DECREASED 69* 68*    Basic Metabolic Panel: Recent Labs  Lab 07/15/21 2336 07/17/21 0401  NA 139 137  K 4.8 4.5  CL 101 100  CO2 24 29  GLUCOSE 154* 93  BUN 36* 25*  CREATININE 6.22* 4.43*  CALCIUM 9.1 8.8*  PHOS  --  3.7    GFR: Estimated Creatinine Clearance: 7.5 mL/min (A) (by C-G formula based on SCr of 4.43 mg/dL (H)).  Liver Function Tests: No results for input(s): AST, ALT, ALKPHOS, BILITOT, PROT, ALBUMIN in the last 168 hours.  CBG: No results for input(s): GLUCAP in the last 168 hours.  Recent Results (from the past 240 hour(s))  Resp Panel by RT-PCR (Flu A&B, Covid) Nasopharyngeal Swab     Status: None   Collection Time: 07/16/21  4:11 AM   Specimen: Nasopharyngeal Swab; Nasopharyngeal(NP) swabs in vial transport medium  Result Value Ref Range Status   SARS Coronavirus 2 by RT PCR NEGATIVE NEGATIVE Final    Comment: (NOTE) SARS-CoV-2 target nucleic acids are NOT DETECTED.  The  SARS-CoV-2 RNA is generally detectable in upper respiratory specimens during the acute phase of infection. The lowest concentration of SARS-CoV-2 viral copies this assay can detect is 138 copies/mL. A negative result does not preclude SARS-Cov-2 infection and should not be used as the sole basis for treatment or other patient management decisions. A negative result may occur with  improper specimen collection/handling, submission of specimen other than nasopharyngeal swab, presence of viral mutation(s) within the areas targeted by this assay, and inadequate number of viral copies(<138 copies/mL). A negative result must be combined with clinical observations, patient history, and epidemiological information. The expected result is Negative.  Fact Sheet for Patients:  EntrepreneurPulse.com.au  Fact Sheet for Healthcare Providers:  IncredibleEmployment.be  This test is no t yet approved or cleared by the [place] FDA and  has been authorized for detection and/or diagnosis of SARS-CoV-2 by FDA under an Emergency Use Authorization (EUA). This EUA will remain  in effect (meaning this  test can be used) for the duration of the COVID-19 declaration under Section 564(b)(1) of the Act, 21 U.S.C.section 360bbb-3(b)(1), unless the authorization is terminated  or revoked sooner.       Influenza A by PCR NEGATIVE NEGATIVE Final   Influenza B by PCR NEGATIVE NEGATIVE Final    Comment: (NOTE) The Xpert Xpress SARS-CoV-2/FLU/RSV plus assay is intended as an aid in the diagnosis of influenza from Nasopharyngeal swab specimens and should not be used as a sole basis for treatment. Nasal washings and aspirates are unacceptable for Xpert Xpress SARS-CoV-2/FLU/RSV testing.  Fact Sheet for Patients: EntrepreneurPulse.com.au  Fact Sheet for Healthcare Providers: IncredibleEmployment.be  This test is not yet approved or  cleared by the [place] FDA and has been authorized for detection and/or diagnosis of SARS-CoV-2 by FDA under an Emergency Use Authorization (EUA). This EUA will remain in effect (meaning this test can be used) for the duration of the COVID-19 declaration under Section 564(b)(1) of the Act, 21 U.S.C. section 360bbb-3(b)(1), unless the authorization is terminated or revoked.  Performed at [facility], [address] [place], [place] [address]   Culture, blood (Routine X 2) w Reflex to ID Panel     Status: None (Preliminary result)   Collection Time: 07/16/21  6:31 AM   Specimen: BLOOD RIGHT HAND  Result Value Ref Range Status   Specimen Description BLOOD RIGHT HAND  Final   Special Requests   Final    BOTTLES DRAWN AEROBIC ONLY Blood Culture results may not be optimal due to an inadequate volume of blood received in culture bottles   Culture   Final    NO GROWTH 2 DAYS Performed at [facility], [address] [place], [place] [address]    Report Status PENDING  Incomplete  Culture, blood (Routine X 2) w Reflex to ID Panel     Status: None (Preliminary result)   Collection Time: 07/16/21  6:31 AM   Specimen: Right Antecubital; Blood  Result Value Ref Range Status   Specimen Description RIGHT ANTECUBITAL  Final   Special Requests   Final    BOTTLES DRAWN AEROBIC ONLY Blood Culture adequate volume   Culture   Final    NO GROWTH 2 DAYS Performed at [facility], [address], [place], [place] [address]    Report Status PENDING  Incomplete  Culture, blood (routine x 2)     Status: None (Preliminary result)   Collection Time: 07/16/21  5:40 PM   Specimen: Porta Cath; Blood  Result Value Ref Range Status   Specimen Description PORTA CATH ARTERIAL AVF  Final   Special Requests   Final    BOTTLES DRAWN AEROBIC AND ANAEROBIC Blood Culture adequate volume   Culture   Final    NO GROWTH 2 DAYS Performed at [facility], [address] [place], [place] [address]    Report  Status PENDING  Incomplete  Culture, blood (routine x 2)     Status: None (Preliminary result)   Collection Time: 07/16/21  5:55 PM   Specimen: Porta Cath; Blood  Result Value Ref Range Status   Specimen Description PORTA CATH VENOUS AVF  Final   Special Requests   Final    BOTTLES DRAWN AEROBIC AND ANAEROBIC Blood Culture adequate volume   Culture   Final    NO GROWTH 2 DAYS Performed at [facility], [address] [place],  [address]    Report Status PENDING  Incomplete  MRSA Next Gen by PCR, Nasal  Status: None   Collection Time: 07/16/21  7:14 PM   Specimen: Nasal Mucosa; Nasal Swab  Result Value Ref Range Status   MRSA by PCR Next Gen NOT DETECTED NOT DETECTED Final    Comment: (NOTE) The GeneXpert MRSA Assay (FDA approved for NASAL specimens only), is one component of a comprehensive MRSA colonization surveillance program. It is not intended to diagnose MRSA infection nor to guide or monitor treatment for MRSA infections. Test performance is not FDA approved in patients less than 82 years old. Performed at [facility], [address], [place], [place] [address]      Radiology Studies: Portable chest 1 View  Result Date: 07/17/2021 CLINICAL DATA:  81 year old female with chest pain. EXAM: PORTABLE CHEST 1 VIEW COMPARISON:  Chest CT 07/16/2021 and earlier. FINDINGS: Portable AP upright view at 0402 hours. Prior CABG. Calcified aortic atherosclerosis. Larger lung volumes compared to 07/15/2021. Stable cardiac size and mediastinal contours. Mild diffuse increased pulmonary interstitial opacity does not appear significantly changed from December radiographs. Small pleural effusion remains appearance. No pneumothorax, consolidation, or areas of worsening ventilation. Right lung base ventilation has mildly improved from 2 days ago. Negative visible bowel gas. No acute osseous abnormality identified. IMPRESSION: Larger lung volumes with mildly improved basilar ventilation from  2 days ago. Stable cardiomegaly, small left pleural effusion, and nonspecific mild diffuse increased interstitial opacity. Electronically Signed   By: Genevie Ann M.D.   On: 07/17/2021 04:39    Scheduled Meds:  ALPRAZolam  0.5 mg Oral QHS   amiodarone  200 mg Oral Daily   aspirin EC  81 mg Oral Daily   Chlorhexidine Gluconate Cloth  6 each Topical Q0600   isosorbide mononitrate  90 mg Oral BID   levothyroxine  25 mcg Oral Daily   metoprolol tartrate  12.5 mg Oral BID   midodrine  5 mg Oral Q M,W,F-HD   multivitamin  1 tablet Oral Daily   rosuvastatin  10 mg Oral Daily   sevelamer carbonate  2,400 mg Oral TID WC   Continuous Infusions:  sodium chloride     sodium chloride       LOS: 2 days   Time spent: 35 mins   Khia Dieterich Wynetta Emery, MD How to contact the [facility] Attending or Consulting provider [place] or covering provider during after hours [place], for this patient?  Check the care team in [facility] and look for a) attending/consulting TRH provider listed and b) the [facility] team listed Log into [email or web address] and use [place]'s universal password to access. If you do not have the password, please contact the hospital operator. Locate the [name] provider you are looking for under [facility] and page to a number that you can be directly reached. If you still have difficulty reaching the provider, please page the [facility] (Director on Call) for the Hospitalists listed on amion for assistance.  07/18/2021, 4:22 PM

## 2021-07-18 NOTE — Progress Notes (Signed)
Patient woke up confused during shift attempting to go home.  Reoriented patient to hospital, and encouraged patient to get back into bed.  Patient has baseline memory impairment which normally worsens at night.

## 2021-07-19 ENCOUNTER — Other Ambulatory Visit (HOSPITAL_COMMUNITY): Payer: Medicare HMO

## 2021-07-19 DIAGNOSIS — I209 Angina pectoris, unspecified: Secondary | ICD-10-CM | POA: Diagnosis not present

## 2021-07-19 DIAGNOSIS — R079 Chest pain, unspecified: Secondary | ICD-10-CM | POA: Diagnosis not present

## 2021-07-19 DIAGNOSIS — N186 End stage renal disease: Secondary | ICD-10-CM | POA: Diagnosis not present

## 2021-07-19 DIAGNOSIS — I2510 Atherosclerotic heart disease of native coronary artery without angina pectoris: Secondary | ICD-10-CM

## 2021-07-19 LAB — CBC
HCT: 25.6 % — ABNORMAL LOW (ref 36.0–46.0)
Hemoglobin: 8.1 g/dL — ABNORMAL LOW (ref 12.0–15.0)
MCH: 36.5 pg — ABNORMAL HIGH (ref 26.0–34.0)
MCHC: 31.6 g/dL (ref 30.0–36.0)
MCV: 115.3 fL — ABNORMAL HIGH (ref 80.0–100.0)
Platelets: 163 10*3/uL (ref 150–400)
RBC: 2.22 MIL/uL — ABNORMAL LOW (ref 3.87–5.11)
RDW: 23.1 % — ABNORMAL HIGH (ref 11.5–15.5)
WBC: 6.6 10*3/uL (ref 4.0–10.5)
nRBC: 0 % (ref 0.0–0.2)

## 2021-07-19 LAB — GLUCOSE, CAPILLARY: Glucose-Capillary: 113 mg/dL — ABNORMAL HIGH (ref 70–99)

## 2021-07-19 LAB — RENAL FUNCTION PANEL
Albumin: 3.2 g/dL — ABNORMAL LOW (ref 3.5–5.0)
Anion gap: 18 — ABNORMAL HIGH (ref 5–15)
BUN: 62 mg/dL — ABNORMAL HIGH (ref 8–23)
CO2: 24 mmol/L (ref 22–32)
Calcium: 9.1 mg/dL (ref 8.9–10.3)
Chloride: 94 mmol/L — ABNORMAL LOW (ref 98–111)
Creatinine, Ser: 8.94 mg/dL — ABNORMAL HIGH (ref 0.44–1.00)
GFR, Estimated: 4 mL/min — ABNORMAL LOW (ref >60)
Glucose, Bld: 73 mg/dL (ref 70–99)
Phosphorus: 3.9 mg/dL (ref 2.5–4.6)
Potassium: 5.2 mmol/L — ABNORMAL HIGH (ref 3.5–5.1)
Sodium: 136 mmol/L (ref 135–145)

## 2021-07-19 LAB — HEPARIN INDUCED PLATELET AB (HIT ANTIBODY): Heparin Induced Plt Ab: 0.065 OD (ref 0.000–0.400)

## 2021-07-19 NOTE — Consult Note (Signed)
Admit: 07/15/2021 LOS: 3  81F ESRD MWF [facility] with CP  Subjective:  In great spirits No CP or SOB Last HD 1/6 with 2L UF For HD today   01/08 0701 - 01/09 0700 In: 2040 [P.O.:2040] Out: -   Filed Weights   07/16/21 1430 07/16/21 1915 07/19/21 0455  Weight: 54.1 kg 56.8 kg 61 kg    Scheduled Meds:  ALPRAZolam  0.5 mg Oral QHS   amiodarone  200 mg Oral Daily   aspirin EC  81 mg Oral Daily   Chlorhexidine Gluconate Cloth  6 each Topical Q0600   Chlorhexidine Gluconate Cloth  6 each Topical Q0600   isosorbide mononitrate  90 mg Oral BID   levothyroxine  25 mcg Oral Daily   metoprolol tartrate  12.5 mg Oral BID   midodrine  5 mg Oral Q M,W,F-HD   multivitamin  1 tablet Oral Daily   rosuvastatin  10 mg Oral Daily   sevelamer carbonate  2,400 mg Oral TID WC   Continuous Infusions:  sodium chloride     sodium chloride     PRN Meds:.sodium chloride, sodium chloride, bisacodyl, diphenhydrAMINE, hydrOXYzine, lidocaine (PF), lidocaine-prilocaine, nitroGLYCERIN, ondansetron **OR** ondansetron (ZOFRAN) IV, pentafluoroprop-tetrafluoroeth  Current Labs: reviewed    Physical Exam:  Blood pressure (!) 172/51, pulse 63, temperature 98.5 F (36.9 C), resp. rate 19, height 5\' 1"  (1.549 m), weight 61 kg, SpO2 98 %. NAD, Positive bright affect RRR CTAB NCAT Nonfocal, AAO x3  Dialysis Orders:  [facility] - MWF-  3.5 hours  EDW 54. HD Bath 2k/2.5 ca, Cellent 17H KOA, Heparin none. Access LUE AVG BFR 300, DFR 500- 15 guage. 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 Morphine should not be used.  Baclofen should be avoided Avoid oral sodium  phosphate and magnesium citrate based laxatives / bowel preps    Pearson Grippe MD 07/19/2021, 9:55 AM  Recent Labs  Lab 07/15/21 2336 07/17/21 0401 07/19/21 0355  NA 139 137 136  K 4.8 4.5 5.2*  CL 101 100 94*  CO2 24 29 24   GLUCOSE 154* 93 73  BUN 36* 25* 62*  CREATININE 6.22* 4.43* 8.94*  CALCIUM 9.1 8.8* 9.1  PHOS  --  3.7 3.9   Recent Labs  Lab 07/17/21 0401 07/18/21 0443 07/19/21 0616  WBC 5.1 6.2 6.6  HGB 6.5* 8.0* 8.1*  HCT 21.5* 25.7* 25.6*  MCV 126.5* 115.8* 115.3*  PLT 69* 68* 163

## 2021-07-19 NOTE — Procedures (Signed)
HEMODIALYSIS TREATMENT NOTE: ° °Uneventful 3.5 hour heparin-free session completed using left upper arm AVG (15g ante/retrograde). Goal met: 2.9 liters removed without interruption in UF.   All blood was returned and hemostasis was achieved in 15 minutes.  No changes from pre-HD assessment. ° ° ° , RN °

## 2021-07-19 NOTE — Progress Notes (Signed)
PROGRESS NOTE   Shawna [name]  BZJ:[id number] DOB: [birth year] DOA: 07/15/2021 PCP: [facility]   Chief Complaint  Patient presents with   Chest Pain   Level of care: Telemetry  Brief Admission History:  81 y.o. female with medical history significant for significant coronary artery disease, unstable angina, status post CABG in 2013, hypothyroidism, carotid artery disease, end-stage renal disease on hemodialysis, chronic diastolic heart failure, history of colon cancer, colonic AVM, osteoarthritis, GERD, multiple hospitalizations for chest pain recently discharged from [facility] on 07/08/2021 after being admitted for chest pain concerning for unstable angina.  During the hospitalization her Imdur was decreased to 30 mg daily due to soft blood pressures.  It was also noted that she had a history of paroxysmal atrial fibrillation.  She had not been anticoagulated secondary to GI bleeding and had been treated with amiodarone and metoprolol.  She was admitted with rising troponins and started on IV heparin infusion by cardiology team.   Assessment & Plan:   Principal Problem:   Ischemic chest pain ([place]) Active Problems:   Atherosclerotic heart disease of native coronary artery without angina pectoris   Essential hypertension, benign   ESRD on hemodialysis ([place])   HLD (hyperlipidemia)   GERD (gastroesophageal reflux disease)   AVM (arteriovenous malformation) of small bowel, acquired   Anemia in chronic kidney disease   Hx of CABG   Carotid artery disease (HCC)   Unstable angina (HCC)   Chest pain   Chest pain unstable angina NSTEMI - pt was initially placed on IV heparin infusion by cardiology team - unfortunately heparin had to be discontinued to to acute drop in Hg 8.1 to 6.5 - HIT labs obtained - Pt is chest pain free now, no plan to restart heparin at this time - imdur dose increased by cardiology team to 90 mg BID, had been reduced to 30 mg at last hospitalization   - If BP tolerates, will continue current imdur dosing.  - continue rosuvastatin 10 mg daily  - continue aspirin 81 mg daily  - fortunately troponins have plateaued and started trending down - with Hg down to 6.5, transfused 1 unit PRBC 1/7, Hg up to 8.0 - she has done so much better after they increased her imdur to 90 mg BID - given new cardiomyopathy EF 30-35% she is being referred for [facility] by cardiology team   Anemia in CKD / acute blood loss anemia - heparin discontinued - transfused 1 unit PRBC, Hg up to 8.1 now from 6.5 - CBC in AM  Thrombocytopenia  - samples have been reported as clumped - HIT panel labs ordered - heparin discontinued on 07/17/21  HFrEF - volume removal with HD - last HD done on 07/16/21 - EF now down to 30-35% which is concerning  Paroxysmal A flutter/SVT - continue  home amiodarone 200 mg daily and metoprolol 12.5 mg BID  ESRD on hemodialysis  - HD MWF, last treatment successfully completed 07/16/21 - nephrology team consulted  Viral URI  - discontinued doxycycline given low procalcitonin   DVT prophylaxis: SCD Code Status: full  Family Communication: husband updated  Disposition: transfer to [facility] for cath per cardiology team  Status is: Inpatient  Remains inpatient appropriate because: IV treatments required, PRBC transfusion   Consultants:  Nephrology Cardiology   Procedures:  Hemodialysis 07/16/21 Echocardiogram / IMPRESSIONS   1. Left ventricular ejection fraction, by estimation, is 30 to 35%. The left ventricle has moderately decreased function. The left ventricle demonstrates regional  wall motion abnormalities (see scoring diagram/findings for description). There is mild left ventricular hypertrophy. Left ventricular diastolic parameters are  consistent with Grade II diastolic dysfunction (pseudonormalization). Elevated left atrial pressure.   2. Right ventricular systolic function is moderately reduced. The right ventricular size is normal.  There is mildly elevated pulmonary artery systolic pressure. The estimated right ventricular systolic pressure is  97.9 mmHg.   3. Left atrial size was severely dilated.   4. Right atrial size was mildly dilated.   5. The mitral valve is degenerative. Moderate mitral valve regurgitation. Mild to moderate mitral stenosis. MG 27mmHg at 70bpm, MVA 1.2 cm^2 by continuity equation. Moderate mitral annular calcification.   6. Tricuspid valve regurgitation is mild to moderate.   7. The aortic valve is tricuspid. Aortic valve regurgitation is not visualized. Aortic valve sclerosis/calcification is present, without any evidence of aortic stenosis.   8. The inferior vena cava is normal in size with greater than 50% respiratory variability, suggesting right atrial pressure of 3 mmHg.   Antimicrobials:  N/a   Subjective: Pt reports no further CP, no further SOB, feels better after transfusion of PRBC.   Objective: Vitals:   07/19/21 1400 07/19/21 1430 07/19/21 1500 07/19/21 1530  BP: (!) 173/54 (!) 157/70 (!) 150/70 (!) 153/55  Pulse: 65 67 68 66  Resp:    18  Temp:    98.2 F (36.8 C)  TempSrc:    Oral  SpO2:    97%  Weight:    57.8 kg  Height:        Intake/Output Summary (Last 24 hours) at 07/19/2021 1644 Last data filed at 07/19/2021 1510 Gross per 24 hour  Intake 1040 ml  Output 2959 ml  Net -1919 ml   Filed Weights   07/19/21 0455 07/19/21 1130 07/19/21 1530  Weight: 61 kg 60.4 kg 57.8 kg    Examination:  General exam: frail, elderly female, poor short term recall, pleasant, cooperative, NAD.   Respiratory system: no rales or crackles heard. Respiratory effort normal. Cardiovascular system: normal S1 & S2 heard. Trace pretibial edema. Gastrointestinal system: Abdomen is nondistended, soft and nontender. No organomegaly or masses felt. Normal bowel sounds heard. Central nervous system: Alert and oriented. No focal neurological deficits. Extremities: Symmetric 5 x 5 power. Skin:  No rashes, lesions or ulcers Psychiatry: Judgement and insight appear normal. Mood & affect appropriate.   Data Reviewed: I have personally reviewed following labs and imaging studies  CBC: Recent Labs  Lab 07/15/21 2336 07/17/21 0401 07/18/21 0443 07/19/21 0616  WBC 9.0 5.1 6.2 6.6  HGB 8.1* 6.5* 8.0* 8.1*  HCT 26.1* 21.5* 25.7* 25.6*  MCV 127.3* 126.5* 115.8* 115.3*  PLT PLATELET CLUMPS NOTED ON SMEAR, COUNT APPEARS DECREASED 69* 68* 892    Basic Metabolic Panel: Recent Labs  Lab 07/15/21 2336 07/17/21 0401 07/19/21 0355  NA 139 137 136  K 4.8 4.5 5.2*  CL 101 100 94*  CO2 24 29 24   GLUCOSE 154* 93 73  BUN 36* 25* 62*  CREATININE 6.22* 4.43* 8.94*  CALCIUM 9.1 8.8* 9.1  PHOS  --  3.7 3.9    GFR: Estimated Creatinine Clearance: 4 mL/min (A) (by C-G formula based on SCr of 8.94 mg/dL (H)).  Liver Function Tests: Recent Labs  Lab 07/19/21 0355  ALBUMIN 3.2*    CBG: No results for input(s): GLUCAP in the last 168 hours.  Recent Results (from the past 240 hour(s))  Resp Panel by RT-PCR (Flu A&B, Covid) Nasopharyngeal Swab  Status: None   Collection Time: 07/16/21  4:11 AM   Specimen: Nasopharyngeal Swab; Nasopharyngeal(NP) swabs in vial transport medium  Result Value Ref Range Status   SARS Coronavirus 2 by RT PCR NEGATIVE NEGATIVE Final    Comment: (NOTE) SARS-CoV-2 target nucleic acids are NOT DETECTED.  The SARS-CoV-2 RNA is generally detectable in upper respiratory specimens during the acute phase of infection. The lowest concentration of SARS-CoV-2 viral copies this assay can detect is 138 copies/mL. A negative result does not preclude SARS-Cov-2 infection and should not be used as the sole basis for treatment or other patient management decisions. A negative result may occur with  improper specimen collection/handling, submission of specimen other than nasopharyngeal swab, presence of viral mutation(s) within the areas targeted by this assay,  and inadequate number of viral copies(<138 copies/mL). A negative result must be combined with clinical observations, patient history, and epidemiological information. The expected result is Negative.  Fact Sheet for Patients:  EntrepreneurPulse.com.au  Fact Sheet for Healthcare Providers:  IncredibleEmployment.be  This test is no t yet approved or cleared by the [place] FDA and  has been authorized for detection and/or diagnosis of SARS-CoV-2 by FDA under an Emergency Use Authorization (EUA). This EUA will remain  in effect (meaning this test can be used) for the duration of the COVID-19 declaration under Section 564(b)(1) of the Act, 21 U.S.C.section 360bbb-3(b)(1), unless the authorization is terminated  or revoked sooner.       Influenza A by PCR NEGATIVE NEGATIVE Final   Influenza B by PCR NEGATIVE NEGATIVE Final    Comment: (NOTE) The Xpert Xpress SARS-CoV-2/FLU/RSV plus assay is intended as an aid in the diagnosis of influenza from Nasopharyngeal swab specimens and should not be used as a sole basis for treatment. Nasal washings and aspirates are unacceptable for Xpert Xpress SARS-CoV-2/FLU/RSV testing.  Fact Sheet for Patients: EntrepreneurPulse.com.au  Fact Sheet for Healthcare Providers: IncredibleEmployment.be  This test is not yet approved or cleared by the [place] FDA and has been authorized for detection and/or diagnosis of SARS-CoV-2 by FDA under an Emergency Use Authorization (EUA). This EUA will remain in effect (meaning this test can be used) for the duration of the COVID-19 declaration under Section 564(b)(1) of the Act, 21 U.S.C. section 360bbb-3(b)(1), unless the authorization is terminated or revoked.  Performed at [facility], [address], [place], [place] [address]   Culture, blood (Routine X 2) w Reflex to ID Panel     Status: None (Preliminary result)    Collection Time: 07/16/21  6:31 AM   Specimen: BLOOD RIGHT HAND  Result Value Ref Range Status   Specimen Description BLOOD RIGHT HAND  Final   Special Requests   Final    BOTTLES DRAWN AEROBIC ONLY Blood Culture results may not be optimal due to an inadequate volume of blood received in culture bottles   Culture   Final    NO GROWTH 2 DAYS Performed at [facility], [address], [place], [place] [address]    Report Status PENDING  Incomplete  Culture, blood (Routine X 2) w Reflex to ID Panel     Status: None (Preliminary result)   Collection Time: 07/16/21  6:31 AM   Specimen: Right Antecubital; Blood  Result Value Ref Range Status   Specimen Description RIGHT ANTECUBITAL  Final   Special Requests   Final    BOTTLES DRAWN AEROBIC ONLY Blood Culture adequate volume   Culture   Final    NO GROWTH 2  DAYS Performed at [facility], [address] [place], [place] [address]    Report Status PENDING  Incomplete  Culture, blood (routine x 2)     Status: None (Preliminary result)   Collection Time: 07/16/21  5:40 PM   Specimen: Porta Cath; Blood  Result Value Ref Range Status   Specimen Description PORTA CATH ARTERIAL AVF  Final   Special Requests   Final    BOTTLES DRAWN AEROBIC AND ANAEROBIC Blood Culture adequate volume   Culture   Final    NO GROWTH 2 DAYS Performed at [facility], [address] [place], [place] [address]    Report Status PENDING  Incomplete  Culture, blood (routine x 2)     Status: None (Preliminary result)   Collection Time: 07/16/21  5:55 PM   Specimen: Porta Cath; Blood  Result Value Ref Range Status   Specimen Description PORTA CATH VENOUS AVF  Final   Special Requests   Final    BOTTLES DRAWN AEROBIC AND ANAEROBIC Blood Culture adequate volume   Culture   Final    NO GROWTH 2 DAYS Performed at [facility], [address], [place], [place] [address]    Report Status PENDING  Incomplete  MRSA Next Gen by PCR, Nasal     Status: None   Collection  Time: 07/16/21  7:14 PM   Specimen: Nasal Mucosa; Nasal Swab  Result Value Ref Range Status   MRSA by PCR Next Gen NOT DETECTED NOT DETECTED Final    Comment: (NOTE) The GeneXpert MRSA Assay (FDA approved for NASAL specimens only), is one component of a comprehensive MRSA colonization surveillance program. It is not intended to diagnose MRSA infection nor to guide or monitor treatment for MRSA infections. Test performance is not FDA approved in patients less than 67 years old. Performed at [facility], [address] [place], [place] [address]      Radiology Studies: No results found.  Scheduled Meds:  ALPRAZolam  0.5 mg Oral QHS   amiodarone  200 mg Oral Daily   aspirin EC  81 mg Oral Daily   Chlorhexidine Gluconate Cloth  6 each Topical Q0600   Chlorhexidine Gluconate Cloth  6 each Topical Q0600   isosorbide mononitrate  90 mg Oral BID   levothyroxine  25 mcg Oral Daily   metoprolol tartrate  12.5 mg Oral BID   midodrine  5 mg Oral Q M,W,F-HD   multivitamin  1 tablet Oral Daily   rosuvastatin  10 mg Oral Daily   sevelamer carbonate  2,400 mg Oral TID WC   Continuous Infusions:  sodium chloride     sodium chloride      LOS: 3 days   Time spent: 35 mins   Yarima Penman Wynetta Emery, MD How to contact the [facility] Attending or Consulting provider [place] or covering provider during after hours [place], for this patient?  Check the care team in [facility] and look for a) attending/consulting TRH provider listed and b) the [facility] team listed Log into [email or web address] and use [place]'s universal password to access. If you do not have the password, please contact the hospital operator. Locate the [facility] provider you are looking for under [facility] and page to a number that you can be directly reached. If you still have difficulty reaching the provider, please page the [facility] (Director on Call) for the Hospitalists listed on amion for assistance.  07/19/2021, 4:44 PM

## 2021-07-19 NOTE — Progress Notes (Signed)
°  Transition of Care [facility]) Screening Note   Patient Details  Name: Shawna [name] Date of Birth: 13-Mar-1941   Transition of Care [facility]) CM/SW Contact:    Boneta Lucks, RN Phone Number: 07/19/2021, 1:34 PM    Transition of Care Department [facility]) has reviewed patient and no TOC needs have been identified at this time. We will continue to monitor patient advancement through interdisciplinary progression rounds. If new patient transition needs arise, please place a TOC consult.

## 2021-07-19 NOTE — Progress Notes (Addendum)
Progress Note  Patient Name: Shawna [name] Date of Encounter: 07/19/2021  [facility] Cardiologist: Carlyle Dolly, MD    Subjective   Sleepy today. No chest pain since admission. Says she was having a lot recently and using NTG with relief.  Inpatient Medications    Scheduled Meds:  ALPRAZolam  0.5 mg Oral QHS   amiodarone  200 mg Oral Daily   aspirin EC  81 mg Oral Daily   Chlorhexidine Gluconate Cloth  6 each Topical Q0600   Chlorhexidine Gluconate Cloth  6 each Topical Q0600   isosorbide mononitrate  90 mg Oral BID   levothyroxine  25 mcg Oral Daily   metoprolol tartrate  12.5 mg Oral BID   midodrine  5 mg Oral Q M,W,F-HD   multivitamin  1 tablet Oral Daily   rosuvastatin  10 mg Oral Daily   sevelamer carbonate  2,400 mg Oral TID WC   Continuous Infusions:  sodium chloride     sodium chloride     PRN Meds: sodium chloride, sodium chloride, bisacodyl, diphenhydrAMINE, hydrOXYzine, lidocaine (PF), lidocaine-prilocaine, nitroGLYCERIN, ondansetron **OR** ondansetron (ZOFRAN) IV, pentafluoroprop-tetrafluoroeth   Vital Signs    Vitals:   07/18/21 1455 07/18/21 2059 07/19/21 0455 07/19/21 0544  BP: (!) 116/41 (!) 140/48  (!) 172/51  Pulse: (!) 59 60  63  Resp:  18  19  Temp: 98.7 F (37.1 C) 98.4 F (36.9 C)  98.5 F (36.9 C)  TempSrc: Oral     SpO2: 96% 100%  98%  Weight:   61 kg   Height:   5\' 1"  (1.549 m)     Intake/Output Summary (Last 24 hours) at 07/19/2021 1008 Last data filed at 07/19/2021 0900 Gross per 24 hour  Intake 1720 ml  Output --  Net 1720 ml   Last 3 Weights 07/19/2021 07/16/2021 07/16/2021  Weight (lbs) 134 lb 7.7 oz 125 lb 3.5 oz 119 lb 4.3 oz  Weight (kg) 61 kg 56.8 kg 54.1 kg      Telemetry    NSR - Personally Reviewed  ECG    NSR with LVH IVCD, no change - Personally Reviewed  Physical Exam    GEN: No acute distress.   Neck: No JVD Cardiac: RRR, 2/6 systolic murmur Respiratory: Clear to auscultation bilaterally. GI: Soft,  nontender, non-distended  MS: No edema; No deformity. Neuro:  Nonfocal  Psych: Normal affect   Labs    High Sensitivity Troponin:   Recent Labs  Lab 07/16/21 0933 07/16/21 1436 07/16/21 1901 07/16/21 2120 07/16/21 2315  TROPONINIHS 313* 449* 356* 279* 251*     Chemistry Recent Labs  Lab 07/15/21 2336 07/17/21 0401 07/19/21 0355  NA 139 137 136  K 4.8 4.5 5.2*  CL 101 100 94*  CO2 24 29 24   GLUCOSE 154* 93 73  BUN 36* 25* 62*  CREATININE 6.22* 4.43* 8.94*  CALCIUM 9.1 8.8* 9.1  ALBUMIN  --   --  3.2*  GFRNONAA 6* 9* 4*  ANIONGAP 14 8 18*    Lipids  Recent Labs  Lab 07/16/21 0151  CHOL 131  TRIG 90  HDL 60  LDLCALC 53  CHOLHDL 2.2    Hematology Recent Labs  Lab 07/17/21 0401 07/18/21 0443 07/19/21 0616  WBC 5.1 6.2 6.6  RBC 1.70* 2.22* 2.22*  HGB 6.5* 8.0* 8.1*  HCT 21.5* 25.7* 25.6*  MCV 126.5* 115.8* 115.3*  MCH 38.2* 36.0* 36.5*  MCHC 30.2 31.1 31.6  RDW 16.9* Not Measured 23.1*  PLT  69* 68* 163   Thyroid No results for input(s): TSH, FREET4 in the last 168 hours.  BNPNo results for input(s): BNP, PROBNP in the last 168 hours.  DDimer No results for input(s): DDIMER in the last 168 hours.   Radiology    No results found.  Cardiac Studies   Echo 07/16/20 IMPRESSIONS     1. Left ventricular ejection fraction, by estimation, is 30 to 35%. The  left ventricle has moderately decreased function. The left ventricle  demonstrates regional wall motion abnormalities (see scoring  diagram/findings for description). There is mild  left ventricular hypertrophy. Left ventricular diastolic parameters are  consistent with Grade II diastolic dysfunction (pseudonormalization).  Elevated left atrial pressure.   2. Right ventricular systolic function is moderately reduced. The right  ventricular size is normal. There is mildly elevated pulmonary artery  systolic pressure. The estimated right ventricular systolic pressure is  96.7 mmHg.   3. Left atrial  size was severely dilated.   4. Right atrial size was mildly dilated.   5. The mitral valve is degenerative. Moderate mitral valve regurgitation.  Mild to moderate mitral stenosis. MG 33mmHg at 70bpm, MVA 1.2 cm^2 by  continuity equation. Moderate mitral annular calcification.   6. Tricuspid valve regurgitation is mild to moderate.   7. The aortic valve is tricuspid. Aortic valve regurgitation is not  visualized. Aortic valve sclerosis/calcification is present, without any  evidence of aortic stenosis.   8. The inferior vena cava is normal in size with greater than 50%  respiratory variability, suggesting right atrial pressure of 3 mmHg.    LV Wall Scoring:  The entire anterior wall, entire anterior septum, mid inferoseptal  segment,  and apex are akinetic. The apical lateral segment and mid anterolateral  segment are hypokinetic. The entire inferior wall, posterior wall, basal  anterolateral segment, and basal inferoseptal segment are normal.   Patient Profile     81 y.o. female   history of CAD with CABG in 2013 and DES to D1 in 10/2016. Most recent cath Jan 2020 with patent grafts and occluded D1 at stent site, medical management was recommended. History of chronic diastolic HF, HTN, HL, PAF non on anticoag due to GI bleed, NSVT, ESRD, hypothyroidism we are asked to see for chest pain and elevated troponins.    Assessment & Plan   NSTEMI troponins peak 449, cath films reviewed during 06/2021 admission and medical management recommended given poor targets and poor response to stenting in the past. Now with worsening LVF 30-35% grade 2 DD, decreased RV function,WMA entire ant wall, ant septum, mid infsep and apex AK. See echo for details. On Imdur 90 mg bid, metoprolol 12.5 bid, crestor. Will discuss with Dr. Harrington Challenger to see if cath indicated with new LVD.   CAD status post CABG in 2013, NSTEMI treated with DES to the diagonal 10/2016, cardiac cath 07/13/2018 with occluded diagonal and  progression of disease in the PDA now 75%, grafts patent and good LV function.  Medical therapy recommended,.    Essential hypertension BP stable.  Hyperlipidemia on simvastatin LDL at goal 10/2017  History of PAF/NSVT on amiodarone.  No anticoagulation because of history of GI bleed. Episode of Aflutter here in ED but in NSR since.  ESRD on HD       For questions or updates, please contact [place] Please consult [email or web address] for contact info under        Signed, Ermalinda Barrios, PA-C  07/19/2021, 10:08 AM    Patient  seen and examined    I agree with findings as noted by Gerrianne Scale above   Pt is much more alert now after dialysis  The pt is currently without CP   Breathing is OK ON exam: Neck  JVP is normal  Lungs are Clear Cardiac exam   RRR  II/VI systolic mrumur LSB  No S3  Ext are without edema  Pt with known severe dz   last cath in 2020 (January)  She was just seen in December in clinic.  [place] medical Rx    The pt presented with  severe /progressive angina,   NSTEMI.  Echo this admit shows decrease in LV function form previous with LVEF 30 to 35% with regional wall motion changes noted.    I have reviewed images and discussed with pt    With failure of medical Rx and drop in LVEF would consider L heart cath to redefine anatomy    Concern though is the  transient drop in Hgb to 6.5  (baseline 8) Pt with no obvious bleeding   Pt had been put on heparin at admit .    That was stopped   Pt transfused 1 U PRBC   Hgb is stable     Check hemoccult   Reassess in AM    Concern if intervention done would need antiplatelet agents     Dorris Carnes MD

## 2021-07-19 NOTE — Plan of Care (Signed)

## 2021-07-19 NOTE — Care Management Important Message (Signed)
Important Message  Patient Details  Name: Shawna [name] MRN: [id number] Date of Birth: October 26, 1940   Medicare Important Message Given:  Yes (copy left at bedside)     [name] 07/19/2021, 1:24 PM

## 2021-07-20 ENCOUNTER — Encounter (HOSPITAL_COMMUNITY): Admission: RE | Payer: Self-pay | Source: Home / Self Care

## 2021-07-20 ENCOUNTER — Ambulatory Visit (HOSPITAL_COMMUNITY): Admission: RE | Admit: 2021-07-20 | Payer: Medicare HMO | Source: Home / Self Care | Admitting: Gastroenterology

## 2021-07-20 ENCOUNTER — Encounter (HOSPITAL_COMMUNITY)
Admission: EM | Disposition: A | Discharge status: Home / Self Care | Payer: Self-pay | Source: Home / Self Care | Attending: Family Medicine

## 2021-07-20 DIAGNOSIS — N186 End stage renal disease: Secondary | ICD-10-CM | POA: Diagnosis not present

## 2021-07-20 DIAGNOSIS — I209 Angina pectoris, unspecified: Secondary | ICD-10-CM | POA: Diagnosis not present

## 2021-07-20 DIAGNOSIS — R079 Chest pain, unspecified: Secondary | ICD-10-CM | POA: Diagnosis not present

## 2021-07-20 DIAGNOSIS — I2510 Atherosclerotic heart disease of native coronary artery without angina pectoris: Secondary | ICD-10-CM | POA: Diagnosis not present

## 2021-07-20 LAB — CBC
HCT: 27.1 % — ABNORMAL LOW (ref 36.0–46.0)
Hemoglobin: 8.3 g/dL — ABNORMAL LOW (ref 12.0–15.0)
MCH: 34.9 pg — ABNORMAL HIGH (ref 26.0–34.0)
MCHC: 30.6 g/dL (ref 30.0–36.0)
MCV: 113.9 fL — ABNORMAL HIGH (ref 80.0–100.0)
Platelets: 165 10*3/uL (ref 150–400)
RBC: 2.38 MIL/uL — ABNORMAL LOW (ref 3.87–5.11)
RDW: 21.5 % — ABNORMAL HIGH (ref 11.5–15.5)
WBC: 5.5 10*3/uL (ref 4.0–10.5)
nRBC: 0 % (ref 0.0–0.2)

## 2021-07-20 LAB — OCCULT BLOOD X 1 CARD TO LAB, STOOL: Fecal Occult Bld: NEGATIVE

## 2021-07-20 SURGERY — LEFT HEART CATH AND CORONARY ANGIOGRAPHY
Anesthesia: LOCAL

## 2021-07-20 SURGERY — COLONOSCOPY WITH PROPOFOL
Anesthesia: Monitor Anesthesia Care

## 2021-07-20 NOTE — Progress Notes (Addendum)
Progress Note  Patient Name: Shawna [name] Date of Encounter: 07/20/2021  [name] Cardiologist: Carlyle Dolly, MD   Subjective   Had a brief episode of tachycardia overnight but did not receive Amiodarone or Lopressor yesterday (due to being at HD and unclear why evening Lopressor was held). Now back in NSR. Reports brief palpitations with the episode. No recurrent chest pain since admission.   Inpatient Medications    Scheduled Meds:  ALPRAZolam  0.5 mg Oral QHS   amiodarone  200 mg Oral Daily   aspirin EC  81 mg Oral Daily   Chlorhexidine Gluconate Cloth  6 each Topical Q0600   Chlorhexidine Gluconate Cloth  6 each Topical Q0600   isosorbide mononitrate  90 mg Oral BID   levothyroxine  25 mcg Oral Daily   metoprolol tartrate  12.5 mg Oral BID   midodrine  5 mg Oral Q M,W,F-HD   multivitamin  1 tablet Oral Daily   rosuvastatin  10 mg Oral Daily   sevelamer carbonate  2,400 mg Oral TID WC   Continuous Infusions:  sodium chloride     sodium chloride     PRN Meds: sodium chloride, sodium chloride, bisacodyl, diphenhydrAMINE, hydrOXYzine, lidocaine (PF), lidocaine-prilocaine, nitroGLYCERIN, ondansetron **OR** ondansetron (ZOFRAN) IV, pentafluoroprop-tetrafluoroeth   Vital Signs    Vitals:   07/19/21 2044 07/20/21 0217 07/20/21 0511 07/20/21 0600  BP: (!) 160/50  127/72   Pulse: 70  (!) 128 60  Resp: 19  18   Temp: 98.7 F (37.1 C)  98.1 F (36.7 C)   TempSrc: Oral  Oral   SpO2: 100%  99%   Weight:  58.4 kg    Height:        Intake/Output Summary (Last 24 hours) at 07/20/2021 0817 Last data filed at 07/19/2021 1700 Gross per 24 hour  Intake 600 ml  Output 2959 ml  Net -2359 ml   Last 3 Weights 07/20/2021 07/19/2021 07/19/2021  Weight (lbs) 128 lb 12 oz 127 lb 6.8 oz 133 lb 2.5 oz  Weight (kg) 58.4 kg 57.8 kg 60.4 kg      Telemetry    NSR, HR in 60's to 70's. Brief narrow-complex tachycardia overnight lasting less than 3 minutes. - Personally  Reviewed  ECG    No new tracings.   Physical Exam   GEN: Pleasant elderly female appearing in no acute distress.   Neck: No JVD Cardiac: RRR, no murmurs, rubs, or gallops. 2/6 systolic murmur along Apex.  Respiratory: Clear to auscultation bilaterally. GI: Soft, nontender, non-distended  MS: No pitting edema; No deformity. Neuro:  Nonfocal  Psych: Normal affect   Labs    High Sensitivity Troponin:   Recent Labs  Lab 07/16/21 0933 07/16/21 1436 07/16/21 1901 07/16/21 2120 07/16/21 2315  TROPONINIHS 313* 449* 356* 279* 251*     Chemistry Recent Labs  Lab 07/15/21 2336 07/17/21 0401 07/19/21 0355  NA 139 137 136  K 4.8 4.5 5.2*  CL 101 100 94*  CO2 24 29 24   GLUCOSE 154* 93 73  BUN 36* 25* 62*  CREATININE 6.22* 4.43* 8.94*  CALCIUM 9.1 8.8* 9.1  ALBUMIN  --   --  3.2*  GFRNONAA 6* 9* 4*  ANIONGAP 14 8 18*    Lipids  Recent Labs  Lab 07/16/21 0151  CHOL 131  TRIG 90  HDL 60  LDLCALC 53  CHOLHDL 2.2    Hematology Recent Labs  Lab 07/17/21 0401 07/18/21 0443 07/19/21 0616  WBC 5.1 6.2  6.6  RBC 1.70* 2.22* 2.22*  HGB 6.5* 8.0* 8.1*  HCT 21.5* 25.7* 25.6*  MCV 126.5* 115.8* 115.3*  MCH 38.2* 36.0* 36.5*  MCHC 30.2 31.1 31.6  RDW 16.9* Not Measured 23.1*  PLT 69* 68* 163    Radiology    No results found.  Cardiac Studies   Echocardiogram: 07/16/2021 IMPRESSIONS    1. Left ventricular ejection fraction, by estimation, is 30 to 35%. The  left ventricle has moderately decreased function. The left ventricle  demonstrates regional wall motion abnormalities (see scoring  diagram/findings for description). There is mild  left ventricular hypertrophy. Left ventricular diastolic parameters are  consistent with Grade II diastolic dysfunction (pseudonormalization).  Elevated left atrial pressure.   2. Right ventricular systolic function is moderately reduced. The right  ventricular size is normal. There is mildly elevated pulmonary artery   systolic pressure. The estimated right ventricular systolic pressure is  78.4 mmHg.   3. Left atrial size was severely dilated.   4. Right atrial size was mildly dilated.   5. The mitral valve is degenerative. Moderate mitral valve regurgitation.  Mild to moderate mitral stenosis. MG 85mmHg at 70bpm, MVA 1.2 cm^2 by  continuity equation. Moderate mitral annular calcification.   6. Tricuspid valve regurgitation is mild to moderate.   7. The aortic valve is tricuspid. Aortic valve regurgitation is not  visualized. Aortic valve sclerosis/calcification is present, without any  evidence of aortic stenosis.   8. The inferior vena cava is normal in size with greater than 50%  respiratory variability, suggesting right atrial pressure of 3 mmHg.   Patient Profile     81 y.o. female w/ PMH of CAD (s/p CABG in 2013, DES to D1 in 10/2016, cath in 07/2018 showing patent grafts with occlusion of D1 at prior stent site and progression of PDA disease --> medical management recommended), HFpEF, HTN, HLD, paroxysmal atrial fibrillation (not on anticoagulation given history of GIB), NSVT, chronic anemia and ESRD who is currently admitted for evaluation of recurrent atrial flutter and chest pain.    Assessment & Plan    1. NSTEMI - She presented with worsening chest pain and a productive cough. Her Imdur had accidently been reduced following her recent admission from 120mg  BID to 30mg  daily and she was taking SL NTG daily.  - Hs Troponin values this admission have peaked at 449 and were trending down to 251 on most recent check. Repeat echo shows her EF is now reduced at 30-35% (previously 50% in 03/2021) with the anterior wall, anterior septum, mid inferoseptal segment and apex being akinetic.  - Was initially on Heparin but now stopped given her anemia and requirement for transfusion. Was initially going to go for a cardiac catheterization today to see if any options for revascularization but plans on hold  pending trend of Hgb and hemoccult results. Will review with Dr. Domenic Polite given no recurrent chest pain as medical management may be the best strategy at this time given her anemia and no revascularization options by last cath.  - Remains on ASA 81mg  daily, Imdur 90mg  BID, Lopressor 12.5mg  BID and Crestor 10mg  daily.   2. CAD - She is s/p CABG in 2013 with DES to D1 in 10/2016 and cath in 07/2018 showing patent grafts with occlusion of D1 at prior stent site and progression of PDA disease with medical management recommended. Was admitted for chest pain in 06/2021 and her case was reviewed with Interventional Cardiology at that time and her PDA was felt  to be a poor target for PCI and medical management was recommended.  - Awaiting further results of work-up for anemia as outlined above before proceeding with cath. If her chest pain remains well-controlled, medical management may be the best strategy at this time given her prior cath results and current anemia.    3. HFrEF - Echo this admission shows a newly reduced EF of 30-35% with WMA as outlined above. Can likely switch Lopressor to Toprol-XL prior to d/c pending BP trend. Continue Imdur. Doubt BP will allow for Hydralazine given her intermittent hypotension with HD (on Midodrine). No ACE-I/ARB/ARNI/Spiro/SGLT2 Inhibitor given her ESRD.  - Volume status has been managed by HD.    4. Paroxysmal Atrial Flutter/SVT - Brief episode of tachycardia overnight but this was in the setting of Amiodarone and Lopressor being held. Continue Lopressor 12.5mg  BID (likely convert to Toprol-XL prior to d/c) and Amiodarone 200mg  daily.  - She has not been on anticoagulation due to her chronic anemia.    5. Anemia - She has a chronic anemia with baseline Hgb around 8. Dropped to 6.5 on 07/17/2021 and she received 1 unit pRBC's. Hgb at 8.1 on 07/20/2021. She refused AM labs for today.    6. ESRD - Nephrology following. Underwent HD yesterday with 2.9L removed.      For questions or updates, please contact [place] Please consult [email or web address] for contact info under        Signed, Erma Heritage, PA-C  07/20/2021, 8:17 AM      Attending note:  Patient seen and examined.  I reviewed her hospital course.  She does not report any chest pain or shortness of breath at rest this morning.  Currently afebrile, heart rate is in the 60s in sinus rhythm on telemetry which I personally reviewed.  She did have an episode of atrial fibrillation/flutter overnight.  Systolic currently in the 120s.  Lungs are clear.  Cardiac exam reveals RRR with 2/6 systolic murmur.  Lab work includes potassium 5.2, BUN 62, creatinine 8.94 (follow-up BMET pending), hemoglobin 8.3, platelets 165.  Echocardiogram during this admission reveals LVEF 30 to 35% range with wall motion abnormalities consistent with ischemic cardiomyopathy, also mild to moderate mitral stenosis and moderate mitral regurgitation.  LVEF in September 2022 was approximately 50%.  Patient admitted with recent worsening angina symptoms, her regular outpatient Imdur dose had been reduced after her last hospital stay which could be a contributor.  Peak high-sensitivity troponin I level 449, not clearly suggestive of ACS in light of her comorbidities, most likely myocardial strain.  She has regular, recurrent angina symptoms despite medical therapy and with newly documented reduction in LVEF, would be a potential candidate for follow-up cardiac catheterization to assess for revascularization options.  Her last procedure in 2020 showed patent bypass grafts with occluded first diagonal at prior stent site and progressive PDA disease, both of which were managed medically.  Main question would be whether there are any new targets to consider, she is not a surgical candidate.  She plans to discuss this with her family and will give [place] a final decision tomorrow.  Otherwise plan to continue medical therapy which  currently includes aspirin, Imdur, Lopressor, amiodarone, and Crestor.  Satira Sark, M.D., F.A.C.C.

## 2021-07-20 NOTE — Progress Notes (Signed)
°   07/20/21 0511  Assess: MEWS Score  Temp 98.1 F (36.7 C)  BP 127/72  Pulse Rate (!) 128  Resp 18  SpO2 99 %  O2 Device Room Air  Assess: MEWS Score  MEWS Temp 0  MEWS Systolic 0  MEWS Pulse 2  MEWS RR 0  MEWS LOC 0  MEWS Score 2  MEWS Score Color Yellow  Assess: if the MEWS score is Yellow or Red  Were vital signs taken at a resting state? Yes  Focused Assessment No change from prior assessment  Early Detection of Sepsis Score *See Row Information* Medium  MEWS guidelines implemented *See Row Information* Yes  Treat  MEWS Interventions Administered prn meds/treatments  Take Vital Signs  Increase Vital Sign Frequency  Yellow: Q 2hr X 2 then Q 4hr X 2, if remains yellow, continue Q 4hrs  Escalate  MEWS: Escalate Yellow: discuss with charge nurse/RN and consider discussing with provider and RRT  Notify: Charge Nurse/RN  Name of Charge Nurse/RN Notified Reather Converse, RN  Date Charge Nurse/RN Notified 07/20/21  Time Charge Nurse/RN Notified 0540  Notify: Provider  Provider Name/Title Dr. Clearence Ped  Date Provider Notified 07/20/21  Time Provider Notified [phone]  Notification Type Page  Notification Reason Other (Comment)  Provider response Other (Comment) (give metoprolol dose that was held last night)  Date of Provider Response 07/20/21  Time of Provider Response [phone]

## 2021-07-20 NOTE — Progress Notes (Signed)
Admit: 07/15/2021 LOS: 4  52F ESRD MWF [facility] with CP  Subjective:  In great spirits No CP or SOB HD yesterday 2.9L UF, no issues  01/09 0701 - 01/10 0700 In: 840 [P.O.:840] Out: 2959   Filed Weights   07/19/21 1130 07/19/21 1530 07/20/21 0217  Weight: 60.4 kg 57.8 kg 58.4 kg    Scheduled Meds:  ALPRAZolam  0.5 mg Oral QHS   amiodarone  200 mg Oral Daily   aspirin EC  81 mg Oral Daily   Chlorhexidine Gluconate Cloth  6 each Topical Q0600   Chlorhexidine Gluconate Cloth  6 each Topical Q0600   isosorbide mononitrate  90 mg Oral BID   levothyroxine  25 mcg Oral Daily   metoprolol tartrate  12.5 mg Oral BID   midodrine  5 mg Oral Q M,W,F-HD   multivitamin  1 tablet Oral Daily   rosuvastatin  10 mg Oral Daily   sevelamer carbonate  2,400 mg Oral TID WC   Continuous Infusions:  sodium chloride     sodium chloride     PRN Meds:.sodium chloride, sodium chloride, bisacodyl, diphenhydrAMINE, hydrOXYzine, lidocaine (PF), lidocaine-prilocaine, nitroGLYCERIN, ondansetron **OR** ondansetron (ZOFRAN) IV, pentafluoroprop-tetrafluoroeth  Current Labs: reviewed    Physical Exam:  Blood pressure 127/72, pulse 60, temperature 98.1 F (36.7 C), temperature source Oral, resp. rate 18, height 5\' 1"  (1.549 m), weight 58.4 kg, SpO2 99 %. NAD, Positive bright affect RRR CTAB NCAT LUE AVG +B Nonfocal, AAO x3  Dialysis Orders:  [facility] - MWF-  3.5 hours  EDW 54. HD Bath 2k/2.5 ca, Cellent 17H KOA, Heparin none. Access LUE AVG BFR 300, DFR 500- 15 guage. 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 Morphine should not be used.  Baclofen should be avoided Avoid oral sodium phosphate and magnesium citrate based laxatives / bowel preps    Pearson Grippe MD 07/20/2021, 9:23 AM  Recent Labs  Lab 07/15/21 2336 07/17/21 0401 07/19/21 0355  NA 139 137 136  K 4.8 4.5 5.2*  CL 101 100 94*  CO2 24 29 24   GLUCOSE 154* 93 73  BUN 36* 25* 62*  CREATININE 6.22* 4.43* 8.94*  CALCIUM 9.1 8.8* 9.1  PHOS  --  3.7 3.9    Recent Labs  Lab 07/17/21 0401 07/18/21 0443 07/19/21 0616  WBC 5.1 6.2 6.6  HGB 6.5* 8.0* 8.1*  HCT 21.5* 25.7* 25.6*  MCV 126.5* 115.8* 115.3*  PLT 69* 68* 163

## 2021-07-20 NOTE — Progress Notes (Signed)
PROGRESS NOTE   Shawna [name]  GYB:[id number] DOB: 05/13/1941 DOA: 07/15/2021 PCP: [facility]   Chief Complaint  Patient presents with   Chest Pain   Level of care: Telemetry  Brief Admission History:  81 y.o. female with medical history significant for significant coronary artery disease, unstable angina, status post CABG in 2013, hypothyroidism, carotid artery disease, end-stage renal disease on hemodialysis, chronic diastolic heart failure, history of colon cancer, colonic AVM, osteoarthritis, GERD, multiple hospitalizations for chest pain recently discharged from [facility] on 07/08/2021 after being admitted for chest pain concerning for unstable angina.  During the hospitalization her Imdur was decreased to 30 mg daily due to soft blood pressures.  It was also noted that she had a history of paroxysmal atrial fibrillation.  She had not been anticoagulated secondary to GI bleeding and had been treated with amiodarone and metoprolol.  She was admitted with rising troponins and started on IV heparin infusion by cardiology team.   Assessment & Plan:   Principal Problem:   Ischemic chest pain ([place]) Active Problems:   Atherosclerotic heart disease of native coronary artery without angina pectoris   Essential hypertension, benign   ESRD on hemodialysis ([place])   HLD (hyperlipidemia)   GERD (gastroesophageal reflux disease)   AVM (arteriovenous malformation) of small bowel, acquired   Anemia in chronic kidney disease   Hx of CABG   Carotid artery disease (HCC)   Unstable angina (HCC)   Chest pain   Chest pain unstable angina NSTEMI - pt was initially placed on IV heparin infusion by cardiology team - unfortunately heparin had to be discontinued to to acute drop in Hg 8.1 to 6.5 - HIT labs obtained - Pt is chest pain free now, no plan to restart heparin at this time - imdur dose increased by cardiology team to 90 mg BID, had been reduced to 30 mg at last hospitalization   - If BP tolerates, will continue current imdur dosing.  - continue rosuvastatin 10 mg daily  - continue aspirin 81 mg daily  - fortunately troponins have plateaued and started trending down - with Hg down to 6.5, transfused 1 unit PRBC 1/7, Hg up to 8.0 - she has done so much better after they increased her imdur to 90 mg BID - given new cardiomyopathy EF 30-35% she is being considered for LHC by cardiology team  - further recommendations per cardiology team   Anemia in CKD / acute blood loss anemia - heparin discontinued - transfused 1 unit PRBC, Hg up to 8.1 from 6.5 - CBC this morning pending (refused labs this morning)   Thrombocytopenia  - samples have been reported as clumped - HIT panel labs ordered - heparin discontinued on 07/17/21  HFrEF - volume removal with HD - last HD done on 07/16/21 - EF now down to 30-35% which is concerning  Paroxysmal A flutter/SVT - continue home amiodarone 200 mg daily and metoprolol 12.5 mg BID  ESRD on hemodialysis  - HD MWF, last treatment successfully completed 07/16/21, 07/19/21 - nephrology team consulted and following   Viral URI - improved now - discontinued doxycycline given low procalcitonin   DVT prophylaxis: SCD Code Status: full  Family Communication: husband updated  Disposition: transfer to [facility] for cath per cardiology team  Status is: Inpatient Remains inpatient appropriate because: IV treatments required, PRBC transfusion   Consultants:  Nephrology Cardiology   Procedures:  Hemodialysis 07/16/21 Echocardiogram / IMPRESSIONS   1. Left ventricular ejection fraction, by estimation,  is 30 to 35%. The left ventricle has moderately decreased function. The left ventricle demonstrates regional wall motion abnormalities (see scoring diagram/findings for description). There is mild left ventricular hypertrophy. Left ventricular diastolic parameters are  consistent with Grade II diastolic dysfunction (pseudonormalization). Elevated  left atrial pressure.   2. Right ventricular systolic function is moderately reduced. The right ventricular size is normal. There is mildly elevated pulmonary artery systolic pressure. The estimated right ventricular systolic pressure is  23.5 mmHg.   3. Left atrial size was severely dilated.   4. Right atrial size was mildly dilated.   5. The mitral valve is degenerative. Moderate mitral valve regurgitation. Mild to moderate mitral stenosis. MG 54mmHg at 70bpm, MVA 1.2 cm^2 by continuity equation. Moderate mitral annular calcification.   6. Tricuspid valve regurgitation is mild to moderate.   7. The aortic valve is tricuspid. Aortic valve regurgitation is not visualized. Aortic valve sclerosis/calcification is present, without any evidence of aortic stenosis.   8. The inferior vena cava is normal in size with greater than 50% respiratory variability, suggesting right atrial pressure of 3 mmHg.   Subjective: Pt reports no chest pain, had palpitations earlier this morning but now resolved. No SOB.  She refused labs this morning.     Objective: Vitals:   07/19/21 2044 07/20/21 0217 07/20/21 0511 07/20/21 0600  BP: (!) 160/50  127/72   Pulse: 70  (!) 128 60  Resp: 19  18   Temp: 98.7 F (37.1 C)  98.1 F (36.7 C)   TempSrc: Oral  Oral   SpO2: 100%  99%   Weight:  58.4 kg    Height:        Intake/Output Summary (Last 24 hours) at 07/20/2021 0919 Last data filed at 07/20/2021 0700 Gross per 24 hour  Intake 480 ml  Output 2959 ml  Net -2479 ml   Filed Weights   07/19/21 1130 07/19/21 1530 07/20/21 0217  Weight: 60.4 kg 57.8 kg 58.4 kg    Examination:  General exam: frail, elderly female, poor short term recall, pleasant, cooperative, NAD.   Respiratory system: no rales or crackles heard. Respiratory effort normal. Cardiovascular system: normal S1 & S2 heard. Trace pretibial edema. Gastrointestinal system: Abdomen is nondistended, soft and nontender. No organomegaly or masses felt.  Normal bowel sounds heard. Central nervous system: Alert and oriented. No focal neurological deficits. Extremities: Symmetric 5 x 5 power. Skin: No rashes, lesions or ulcers Psychiatry: Judgement and insight appear normal. Mood & affect appropriate.   Data Reviewed: I have personally reviewed following labs and imaging studies  CBC: Recent Labs  Lab 07/15/21 2336 07/17/21 0401 07/18/21 0443 07/19/21 0616  WBC 9.0 5.1 6.2 6.6  HGB 8.1* 6.5* 8.0* 8.1*  HCT 26.1* 21.5* 25.7* 25.6*  MCV 127.3* 126.5* 115.8* 115.3*  PLT PLATELET CLUMPS NOTED ON SMEAR, COUNT APPEARS DECREASED 69* 68* 573    Basic Metabolic Panel: Recent Labs  Lab 07/15/21 2336 07/17/21 0401 07/19/21 0355  NA 139 137 136  K 4.8 4.5 5.2*  CL 101 100 94*  CO2 24 29 24   GLUCOSE 154* 93 73  BUN 36* 25* 62*  CREATININE 6.22* 4.43* 8.94*  CALCIUM 9.1 8.8* 9.1  PHOS  --  3.7 3.9    GFR: Estimated Creatinine Clearance: 4.1 mL/min (A) (by C-G formula based on SCr of 8.94 mg/dL (H)).  Liver Function Tests: Recent Labs  Lab 07/19/21 0355  ALBUMIN 3.2*    CBG: Recent Labs  Lab 07/19/21 2041  GLUCAP 113*    Recent Results (from the past 240 hour(s))  Resp Panel by RT-PCR (Flu A&B, Covid) Nasopharyngeal Swab     Status: None   Collection Time: 07/16/21  4:11 AM   Specimen: Nasopharyngeal Swab; Nasopharyngeal(NP) swabs in vial transport medium  Result Value Ref Range Status   SARS Coronavirus 2 by RT PCR NEGATIVE NEGATIVE Final    Comment: (NOTE) SARS-CoV-2 target nucleic acids are NOT DETECTED.  The SARS-CoV-2 RNA is generally detectable in upper respiratory specimens during the acute phase of infection. The lowest concentration of SARS-CoV-2 viral copies this assay can detect is 138 copies/mL. A negative result does not preclude SARS-Cov-2 infection and should not be used as the sole basis for treatment or other patient management decisions. A negative result may occur with  improper specimen  collection/handling, submission of specimen other than nasopharyngeal swab, presence of viral mutation(s) within the areas targeted by this assay, and inadequate number of viral copies(<138 copies/mL). A negative result must be combined with clinical observations, patient history, and epidemiological information. The expected result is Negative.  Fact Sheet for Patients:  EntrepreneurPulse.com.au  Fact Sheet for Healthcare Providers:  IncredibleEmployment.be  This test is no t yet approved or cleared by the [place] FDA and  has been authorized for detection and/or diagnosis of SARS-CoV-2 by FDA under an Emergency Use Authorization (EUA). This EUA will remain  in effect (meaning this test can be used) for the duration of the COVID-19 declaration under Section 564(b)(1) of the Act, 21 U.S.C.section 360bbb-3(b)(1), unless the authorization is terminated  or revoked sooner.       Influenza A by PCR NEGATIVE NEGATIVE Final   Influenza B by PCR NEGATIVE NEGATIVE Final    Comment: (NOTE) The Xpert Xpress SARS-CoV-2/FLU/RSV plus assay is intended as an aid in the diagnosis of influenza from Nasopharyngeal swab specimens and should not be used as a sole basis for treatment. Nasal washings and aspirates are unacceptable for Xpert Xpress SARS-CoV-2/FLU/RSV testing.  Fact Sheet for Patients: EntrepreneurPulse.com.au  Fact Sheet for Healthcare Providers: IncredibleEmployment.be  This test is not yet approved or cleared by the [place] FDA and has been authorized for detection and/or diagnosis of SARS-CoV-2 by FDA under an Emergency Use Authorization (EUA). This EUA will remain in effect (meaning this test can be used) for the duration of the COVID-19 declaration under Section 564(b)(1) of the Act, 21 U.S.C. section 360bbb-3(b)(1), unless the authorization is terminated or revoked.  Performed at [facility], [address], [place], [place] [address]   Culture, blood (Routine X 2) w Reflex to ID Panel     Status: None (Preliminary result)   Collection Time: 07/16/21  6:31 AM   Specimen: BLOOD RIGHT HAND  Result Value Ref Range Status   Specimen Description BLOOD RIGHT HAND  Final   Special Requests   Final    BOTTLES DRAWN AEROBIC ONLY Blood Culture results may not be optimal due to an inadequate volume of blood received in culture bottles   Culture   Final    NO GROWTH 4 DAYS Performed at [facility], [address], [place], [place] [address]    Report Status PENDING  Incomplete  Culture, blood (Routine X 2) w Reflex to ID Panel     Status: None (Preliminary result)   Collection Time: 07/16/21  6:31 AM   Specimen: Right Antecubital; Blood  Result Value Ref Range Status   Specimen Description RIGHT ANTECUBITAL  Final   Special Requests  Final    BOTTLES DRAWN AEROBIC ONLY Blood Culture adequate volume   Culture   Final    NO GROWTH 4 DAYS Performed at [facility], [address], [place], [place] [address]    Report Status PENDING  Incomplete  Culture, blood (routine x 2)     Status: None (Preliminary result)   Collection Time: 07/16/21  5:40 PM   Specimen: Porta Cath; Blood  Result Value Ref Range Status   Specimen Description PORTA CATH ARTERIAL AVF  Final   Special Requests   Final    BOTTLES DRAWN AEROBIC AND ANAEROBIC Blood Culture adequate volume   Culture   Final    NO GROWTH 4 DAYS Performed at [facility], [address] [place], [place] [address]    Report Status PENDING  Incomplete  Culture, blood (routine x 2)     Status: None (Preliminary result)   Collection Time: 07/16/21  5:55 PM   Specimen: Porta Cath; Blood  Result Value Ref Range Status   Specimen Description PORTA CATH VENOUS AVF  Final   Special Requests   Final    BOTTLES DRAWN AEROBIC AND ANAEROBIC Blood Culture adequate volume   Culture   Final    NO GROWTH 4 DAYS Performed at [facility], [address], [place], [place] [address]    Report Status PENDING  Incomplete  MRSA Next Gen by PCR, Nasal     Status: None   Collection Time: 07/16/21  7:14 PM   Specimen: Nasal Mucosa; Nasal Swab  Result Value Ref Range Status   MRSA by PCR Next Gen NOT DETECTED NOT DETECTED Final    Comment: (NOTE) The GeneXpert MRSA Assay (FDA approved for NASAL specimens only), is one component of a comprehensive MRSA colonization surveillance program. It is not intended to diagnose MRSA infection nor to guide or monitor treatment for MRSA infections. Test performance is not FDA approved in patients less than 64 years old. Performed at [facility], [address], [place], [place] [address]      Radiology Studies: No results found.  Scheduled Meds:  ALPRAZolam  0.5 mg Oral QHS   amiodarone  200 mg Oral Daily   aspirin EC  81 mg Oral Daily   Chlorhexidine Gluconate Cloth  6 each Topical Q0600   Chlorhexidine Gluconate Cloth  6 each Topical Q0600   isosorbide mononitrate  90 mg Oral BID   levothyroxine  25 mcg Oral Daily   metoprolol tartrate  12.5 mg Oral BID   midodrine  5 mg Oral Q M,W,F-HD   multivitamin  1 tablet Oral Daily   rosuvastatin  10 mg Oral Daily   sevelamer carbonate  2,400 mg Oral TID WC   Continuous Infusions:  sodium chloride     sodium chloride      LOS: 4 days   Time spent: 35 mins   Amenda Duclos Wynetta Emery, MD How to contact the [facility] Attending or Consulting provider [place] or covering provider during after hours [place], for this patient?  Check the care team in [facility] and look for a) attending/consulting [facility] provider listed and b) the [facility] team listed Log into [email or web address] and use [place]'s universal password to access. If you do not have the password, please contact the hospital operator. Locate the [facility] provider you are looking for under [facility] and page to a number that you can be directly reached. If you still have difficulty reaching the provider,  please page the [facility] (Director  on Call) for the Hospitalists listed on amion for assistance.  07/20/2021, 9:19 AM

## 2021-07-20 NOTE — Progress Notes (Signed)
Patient refused lab draw this morning. 

## 2021-07-21 DIAGNOSIS — I209 Angina pectoris, unspecified: Secondary | ICD-10-CM | POA: Diagnosis not present

## 2021-07-21 DIAGNOSIS — I480 Paroxysmal atrial fibrillation: Secondary | ICD-10-CM

## 2021-07-21 DIAGNOSIS — I214 Non-ST elevation (NSTEMI) myocardial infarction: Principal | ICD-10-CM

## 2021-07-21 DIAGNOSIS — I5041 Acute combined systolic (congestive) and diastolic (congestive) heart failure: Secondary | ICD-10-CM

## 2021-07-21 LAB — RENAL FUNCTION PANEL
Albumin: 3 g/dL — ABNORMAL LOW (ref 3.5–5.0)
Anion gap: 13 (ref 5–15)
BUN: 52 mg/dL — ABNORMAL HIGH (ref 8–23)
CO2: 26 mmol/L (ref 22–32)
Calcium: 9.4 mg/dL (ref 8.9–10.3)
Chloride: 95 mmol/L — ABNORMAL LOW (ref 98–111)
Creatinine, Ser: 7.62 mg/dL — ABNORMAL HIGH (ref 0.44–1.00)
GFR, Estimated: 5 mL/min — ABNORMAL LOW (ref >60)
Glucose, Bld: 95 mg/dL (ref 70–99)
Phosphorus: 3.8 mg/dL (ref 2.5–4.6)
Potassium: 3.9 mmol/L (ref 3.5–5.1)
Sodium: 134 mmol/L — ABNORMAL LOW (ref 135–145)

## 2021-07-21 LAB — CBC
HCT: 24.4 % — ABNORMAL LOW (ref 36.0–46.0)
Hemoglobin: 7.8 g/dL — ABNORMAL LOW (ref 12.0–15.0)
MCH: 36.3 pg — ABNORMAL HIGH (ref 26.0–34.0)
MCHC: 32 g/dL (ref 30.0–36.0)
MCV: 113.5 fL — ABNORMAL HIGH (ref 80.0–100.0)
Platelets: 175 10*3/uL (ref 150–400)
RBC: 2.15 MIL/uL — ABNORMAL LOW (ref 3.87–5.11)
RDW: 20.7 % — ABNORMAL HIGH (ref 11.5–15.5)
WBC: 5.6 10*3/uL (ref 4.0–10.5)
nRBC: 0 % (ref 0.0–0.2)

## 2021-07-21 LAB — CULTURE, BLOOD (ROUTINE X 2)
Culture: NO GROWTH
Culture: NO GROWTH
Culture: NO GROWTH
Culture: NO GROWTH
Special Requests: ADEQUATE
Special Requests: ADEQUATE
Special Requests: ADEQUATE

## 2021-07-21 MED ORDER — GUAIFENESIN-DM 100-10 MG/5ML PO SYRP
5.0000 mL | ORAL_SOLUTION | ORAL | Status: DC | PRN
  Administered 2021-07-22: 5 mL via ORAL
  Filled 2021-07-21: qty 5

## 2021-07-21 MED ORDER — IBUPROFEN 600 MG PO TABS
600.0000 mg | ORAL_TABLET | Freq: Four times a day (QID) | ORAL | Status: DC | PRN
  Administered 2021-07-21 – 2021-07-23 (×2): 600 mg via ORAL
  Filled 2021-07-21 (×2): qty 1

## 2021-07-21 NOTE — Procedures (Signed)
° °  HEMODIALYSIS TREATMENT NOTE:   Uneventful 3.5 hour heparin-free treatment completed using left upper arm AVG (16g ante/retrograde). Goal met: 2.1 liters removed.  All blood was returned and hemostasis was achieved in 15 minutes.  No changes from pre-HD assessment.   Rockwell Alexandria, RN

## 2021-07-21 NOTE — Progress Notes (Addendum)
Admit: 07/15/2021 LOS: 5  17F ESRD MWF [facility] with CP  Subjective:  No acute events. Denies chest pain. Open for HD today  01/10 0701 - 01/11 0700 In: 720 [P.O.:720] Out: -   Filed Weights   07/19/21 1530 07/20/21 0217 07/21/21 0628  Weight: 57.8 kg 58.4 kg 58.2 kg    Scheduled Meds:  ALPRAZolam  0.5 mg Oral QHS   amiodarone  200 mg Oral Daily   aspirin EC  81 mg Oral Daily   Chlorhexidine Gluconate Cloth  6 each Topical Q0600   Chlorhexidine Gluconate Cloth  6 each Topical Q0600   isosorbide mononitrate  90 mg Oral BID   levothyroxine  25 mcg Oral Daily   metoprolol tartrate  12.5 mg Oral BID   midodrine  5 mg Oral Q M,W,F-HD   multivitamin  1 tablet Oral Daily   rosuvastatin  10 mg Oral Daily   sevelamer carbonate  2,400 mg Oral TID WC   Continuous Infusions:  sodium chloride     sodium chloride     PRN Meds:.sodium chloride, sodium chloride, bisacodyl, diphenhydrAMINE, hydrOXYzine, lidocaine (PF), lidocaine-prilocaine, nitroGLYCERIN, ondansetron **OR** ondansetron (ZOFRAN) IV, pentafluoroprop-tetrafluoroeth  Current Labs: reviewed    Physical Exam:  Blood pressure (!) 163/50, pulse 65, temperature 98.4 F (36.9 C), resp. rate 15, height 5\' 1"  (1.549 m), weight 58.2 kg, SpO2 100 %. Gen: elderly female, NAD, comfortable CV: s1s2, rrr Resp: cta bl Abd: soft, nt/nd Ext: no edema Neuro: awake, alert, speech clear and coherent, moves all ext spontaneously Dialysis access: LUE AVG +b/t    Recent Labs  Lab 07/17/21 0401 07/19/21 0355 07/21/21 0451  NA 137 136 134*  K 4.5 5.2* 3.9  CL 100 94* 95*  CO2 29 24 26   GLUCOSE 93 73 95  BUN 25* 62* 52*  CREATININE 4.43* 8.94* 7.62*  CALCIUM 8.8* 9.1 9.4  PHOS 3.7 3.9 3.8   Recent Labs  Lab 07/19/21 0616 07/20/21 0915 07/21/21 0451  WBC 6.6 5.5 5.6  HGB 8.1* 8.3* 7.8*  HCT 25.6* 27.1* 24.4*  MCV 115.3* 113.9* 113.5*  PLT 163 165 175     Dialysis Orders:  [facility] - MWF-  3.5 hours  EDW 54. HD  [place] 2k/2.5 ca, Cellent 17H KOA, Heparin none. Access LUE AVG BFR 300, DFR 500- 15 guage. Micera 200 mcg IV every 2 weeks  A ESRD MWF, on schedule: 2K, 2-3L UF, no heparin, AVG, 3.5h, 300/500 BFR/DFR NSTEMI, known severe CAD,CP, resolved, per [facility] and cardiology Anemia, had some decline inpaitent, rec'd 1u PRBC, AML pending today. Stool occult neg 1/10. 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 Morphine should not be used.  Baclofen should be avoided Avoid oral sodium phosphate and magnesium citrate based laxatives / bowel preps   Gean Quint, MD [place]

## 2021-07-21 NOTE — Progress Notes (Signed)
PROGRESS NOTE    Shawna [name]  GYF:[id number] DOB: [birth year] DOA: 07/15/2021 PCP: [facility]   Brief Narrative:   81 y.o. female with medical history significant for significant coronary artery disease, unstable angina, status post CABG in 2013, hypothyroidism, carotid artery disease, end-stage renal disease on hemodialysis, chronic diastolic heart failure, history of colon cancer, colonic AVM, osteoarthritis, GERD, multiple hospitalizations for chest pain recently discharged from [facility] on 07/08/2021 after being admitted for chest pain concerning for unstable angina.  During the hospitalization her Imdur was decreased to 30 mg daily due to soft blood pressures.  It was also noted that she had a history of paroxysmal atrial fibrillation.  She had not been anticoagulated secondary to GI bleeding and had been treated with amiodarone and metoprolol.  She was admitted with rising troponins and started on IV heparin infusion by cardiology team.  IV heparin drip had to be discontinued after hemoglobin drop was noted.  She did receive 1 unit PRBC transfusion and is currently demonstrating stable hemoglobin levels.  No occult bleed noted.  She is in stable condition for transfer to [facility] for cardiac catheterization anticipated 1/12.  Assessment & Plan:   Principal Problem:   Ischemic chest pain ([place]) Active Problems:   Atherosclerotic heart disease of native coronary artery without angina pectoris   Essential hypertension, benign   ESRD on hemodialysis (HCC)   HLD (hyperlipidemia)   Acute combined systolic and diastolic heart failure (HCC)   GERD (gastroesophageal reflux disease)   AVM (arteriovenous malformation) of small bowel, acquired   Anemia in chronic kidney disease   Hx of CABG   Carotid artery disease (HCC)   Unstable angina (HCC)   Non-ST elevation (NSTEMI) myocardial infarction (HCC)   PAF (paroxysmal atrial fibrillation) (HCC)   Chest pain   Chest pain  unstable angina NSTEMI - pt was initially placed on IV heparin infusion by cardiology team, currently chest pain-free - unfortunately heparin had to be discontinued to to acute drop in Hg 8.1 to 6.5 - HIT labs obtained and pending - Pt is chest pain free now, no plan to restart heparin at this time - imdur dose increased by cardiology team to 90 mg BID, had been reduced to 30 mg at last hospitalization  - If BP tolerates, will continue current imdur dosing.  - continue rosuvastatin 10 mg daily  - continue aspirin 81 mg daily  - fortunately troponins have plateaued and started trending down - with Hg down to 6.5, transfused 1 unit PRBC 1/7, Hg up to 8.0>7.8; stable - she has done so much better after they increased her imdur to 90 mg BID - given new cardiomyopathy EF 30-35% she has been recommended cardiac catheterization and will be transferred to [facility], patient to be n.p.o. after midnight   Anemia in CKD / acute blood loss anemia-currently stable - heparin discontinued - transfused 1 unit PRBC, Hg up to 7.8 from 6.5 - CBC to be followed   Thrombocytopenia-improved - samples have been reported as clumped - HIT panel labs ordered - heparin discontinued on 07/17/21 -Follow CBC   HFrEF - volume removal with HD - last HD done on 07/16/21 - EF now down to 30-35% which is concerning -Plan for cardiac catheterization on 1/12   Paroxysmal A flutter/SVT-rate controlled - continue home amiodarone 200 mg daily and metoprolol 12.5 mg BID   ESRD on hemodialysis  - HD MWF, last treatment successfully completed 07/16/21, 07/19/21, plan for further hemodialysis 1/11 -  nephrology team consulted and following    Viral URI - improved now - discontinued doxycycline given low procalcitonin    DVT prophylaxis: SCD Code Status: full  Family Communication: husband updated  Disposition: transfer to [facility] for cath per cardiology team  Status is: Inpatient Remains inpatient appropriate because: IV  treatments required, PRBC transfusion    Consultants:  Nephrology Cardiology    Procedures:  Hemodialysis 07/16/21 Echocardiogram / IMPRESSIONS   1. Left ventricular ejection fraction, by estimation, is 30 to 35%. The left ventricle has moderately decreased function. The left ventricle demonstrates regional wall motion abnormalities (see scoring diagram/findings for description). There is mild left ventricular hypertrophy. Left ventricular diastolic parameters are  consistent with Grade II diastolic dysfunction (pseudonormalization). Elevated left atrial pressure.   2. Right ventricular systolic function is moderately reduced. The right ventricular size is normal. There is mildly elevated pulmonary artery systolic pressure. The estimated right ventricular systolic pressure is  35.3 mmHg.   3. Left atrial size was severely dilated.   4. Right atrial size was mildly dilated.   5. The mitral valve is degenerative. Moderate mitral valve regurgitation. Mild to moderate mitral stenosis. MG 22mmHg at 70bpm, MVA 1.2 cm^2 by continuity equation. Moderate mitral annular calcification.   6. Tricuspid valve regurgitation is mild to moderate.   7. The aortic valve is tricuspid. Aortic valve regurgitation is not visualized. Aortic valve sclerosis/calcification is present, without any evidence of aortic stenosis.   8. The inferior vena cava is normal in size with greater than 50% respiratory variability, suggesting right atrial pressure of 3 mmHg.   Subjective: Patient seen and evaluated today with no new acute complaints or concerns. No acute concerns or events noted overnight.  She is agreeable to cardiac catheterization and therefore will be transferred to [facility] after hemodialysis today for catheterization on 1/12.  Objective: Vitals:   07/20/21 2100 07/21/21 0417 07/21/21 0628 07/21/21 0630  BP: (!) 176/54 (!) 180/58  (!) 163/50  Pulse: 64 65    Resp: 19 15    Temp: 98.5 F (36.9 C) 98.4 F  (36.9 C)    TempSrc: Oral     SpO2: 100% 100%    Weight:   58.2 kg   Height:        Intake/Output Summary (Last 24 hours) at 07/21/2021 0927 Last data filed at 07/21/2021 0600 Gross per 24 hour  Intake 720 ml  Output --  Net 720 ml   Filed Weights   07/19/21 1530 07/20/21 0217 07/21/21 0628  Weight: 57.8 kg 58.4 kg 58.2 kg    Examination:  General exam: Appears calm and comfortable  Respiratory system: Clear to auscultation. Respiratory effort normal. Cardiovascular system: S1 & S2 heard, RRR.  Gastrointestinal system: Abdomen is soft Central nervous system: Alert and awake Extremities: No edema Skin: No significant lesions noted Psychiatry: Flat affect.    Data Reviewed: I have personally reviewed following labs and imaging studies  CBC: Recent Labs  Lab 07/17/21 0401 07/18/21 0443 07/19/21 0616 07/20/21 0915 07/21/21 0451  WBC 5.1 6.2 6.6 5.5 5.6  HGB 6.5* 8.0* 8.1* 8.3* 7.8*  HCT 21.5* 25.7* 25.6* 27.1* 24.4*  MCV 126.5* 115.8* 115.3* 113.9* 113.5*  PLT 69* 68* 163 165 614   Basic Metabolic Panel: Recent Labs  Lab 07/15/21 2336 07/17/21 0401 07/19/21 0355 07/21/21 0451  NA 139 137 136 134*  K 4.8 4.5 5.2* 3.9  CL 101 100 94* 95*  CO2 24 29 24 26   GLUCOSE 154* 93  73 95  BUN 36* 25* 62* 52*  CREATININE 6.22* 4.43* 8.94* 7.62*  CALCIUM 9.1 8.8* 9.1 9.4  PHOS  --  3.7 3.9 3.8   GFR: Estimated Creatinine Clearance: 4.8 mL/min (A) (by C-G formula based on SCr of 7.62 mg/dL (H)). Liver Function Tests: Recent Labs  Lab 07/19/21 0355 07/21/21 0451  ALBUMIN 3.2* 3.0*   No results for input(s): LIPASE, AMYLASE in the last 168 hours. No results for input(s): AMMONIA in the last 168 hours. Coagulation Profile: No results for input(s): INR, PROTIME in the last 168 hours. Cardiac Enzymes: No results for input(s): CKTOTAL, CKMB, CKMBINDEX, TROPONINI in the last 168 hours. BNP (last 3 results) No results for input(s): PROBNP in the last 8760  hours. HbA1C: No results for input(s): HGBA1C in the last 72 hours. CBG: Recent Labs  Lab 07/19/21 2041  GLUCAP 113*   Lipid Profile: No results for input(s): CHOL, HDL, LDLCALC, TRIG, CHOLHDL, LDLDIRECT in the last 72 hours. Thyroid Function Tests: No results for input(s): TSH, T4TOTAL, FREET4, T3FREE, THYROIDAB in the last 72 hours. Anemia Panel: No results for input(s): VITAMINB12, FOLATE, FERRITIN, TIBC, IRON, RETICCTPCT in the last 72 hours. Sepsis Labs: Recent Labs  Lab 07/16/21 0151  PROCALCITON 0.12    Recent Results (from the past 240 hour(s))  Resp Panel by RT-PCR (Flu A&B, Covid) Nasopharyngeal Swab     Status: None   Collection Time: 07/16/21  4:11 AM   Specimen: Nasopharyngeal Swab; Nasopharyngeal(NP) swabs in vial transport medium  Result Value Ref Range Status   SARS Coronavirus 2 by RT PCR NEGATIVE NEGATIVE Final    Comment: (NOTE) SARS-CoV-2 target nucleic acids are NOT DETECTED.  The SARS-CoV-2 RNA is generally detectable in upper respiratory specimens during the acute phase of infection. The lowest concentration of SARS-CoV-2 viral copies this assay can detect is 138 copies/mL. A negative result does not preclude SARS-Cov-2 infection and should not be used as the sole basis for treatment or other patient management decisions. A negative result may occur with  improper specimen collection/handling, submission of specimen other than nasopharyngeal swab, presence of viral mutation(s) within the areas targeted by this assay, and inadequate number of viral copies(<138 copies/mL). A negative result must be combined with clinical observations, patient history, and epidemiological information. The expected result is Negative.  Fact Sheet for Patients:  EntrepreneurPulse.com.au  Fact Sheet for Healthcare Providers:  IncredibleEmployment.be  This test is no t yet approved or cleared by the [place] FDA and  has been  authorized for detection and/or diagnosis of SARS-CoV-2 by FDA under an Emergency Use Authorization (EUA). This EUA will remain  in effect (meaning this test can be used) for the duration of the COVID-19 declaration under Section 564(b)(1) of the Act, 21 U.S.C.section 360bbb-3(b)(1), unless the authorization is terminated  or revoked sooner.       Influenza A by PCR NEGATIVE NEGATIVE Final   Influenza B by PCR NEGATIVE NEGATIVE Final    Comment: (NOTE) The Xpert Xpress SARS-CoV-2/FLU/RSV plus assay is intended as an aid in the diagnosis of influenza from Nasopharyngeal swab specimens and should not be used as a sole basis for treatment. Nasal washings and aspirates are unacceptable for Xpert Xpress SARS-CoV-2/FLU/RSV testing.  Fact Sheet for Patients: EntrepreneurPulse.com.au  Fact Sheet for Healthcare Providers: IncredibleEmployment.be  This test is not yet approved or cleared by the [place] FDA and has been authorized for detection and/or diagnosis of SARS-CoV-2 by FDA under an Emergency Use Authorization (EUA). This EUA  will remain in effect (meaning this test can be used) for the duration of the COVID-19 declaration under Section 564(b)(1) of the Act, 21 U.S.C. section 360bbb-3(b)(1), unless the authorization is terminated or revoked.  Performed at [facility], [address] [place], [place] [address]   Culture, blood (Routine X 2) w Reflex to ID Panel     Status: None   Collection Time: 07/16/21  6:31 AM   Specimen: BLOOD RIGHT HAND  Result Value Ref Range Status   Specimen Description BLOOD RIGHT HAND  Final   Special Requests   Final    BOTTLES DRAWN AEROBIC ONLY Blood Culture results may not be optimal due to an inadequate volume of blood received in culture bottles   Culture   Final    NO GROWTH 5 DAYS Performed at [facility], [address], [place], [place] [address]    Report Status 07/21/2021 FINAL  Final  Culture,  blood (Routine X 2) w Reflex to ID Panel     Status: None   Collection Time: 07/16/21  6:31 AM   Specimen: Right Antecubital; Blood  Result Value Ref Range Status   Specimen Description RIGHT ANTECUBITAL  Final   Special Requests   Final    BOTTLES DRAWN AEROBIC ONLY Blood Culture adequate volume   Culture   Final    NO GROWTH 5 DAYS Performed at [facility], [address] [place], [place] [address]    Report Status 07/21/2021 FINAL  Final  Culture, blood (routine x 2)     Status: None   Collection Time: 07/16/21  5:40 PM   Specimen: Porta Cath; Blood  Result Value Ref Range Status   Specimen Description PORTA CATH ARTERIAL AVF  Final   Special Requests   Final    BOTTLES DRAWN AEROBIC AND ANAEROBIC Blood Culture adequate volume   Culture   Final    NO GROWTH 5 DAYS Performed at [facility], [address], [place], [place] [address]    Report Status 07/21/2021 FINAL  Final  Culture, blood (routine x 2)     Status: None   Collection Time: 07/16/21  5:55 PM   Specimen: Porta Cath; Blood  Result Value Ref Range Status   Specimen Description PORTA CATH VENOUS AVF  Final   Special Requests   Final    BOTTLES DRAWN AEROBIC AND ANAEROBIC Blood Culture adequate volume   Culture   Final    NO GROWTH 5 DAYS Performed at [facility], [address] [place], [place] [address]    Report Status 07/21/2021 FINAL  Final  MRSA Next Gen by PCR, Nasal     Status: None   Collection Time: 07/16/21  7:14 PM   Specimen: Nasal Mucosa; Nasal Swab  Result Value Ref Range Status   MRSA by PCR Next Gen NOT DETECTED NOT DETECTED Final    Comment: (NOTE) The GeneXpert MRSA Assay (FDA approved for NASAL specimens only), is one component of a comprehensive MRSA colonization surveillance program. It is not intended to diagnose MRSA infection nor to guide or monitor treatment for MRSA infections. Test performance is not FDA approved in patients less than 40 years old. Performed at [facility], [address], [place],  [address]          Radiology Studies: No results found.      Scheduled Meds:  ALPRAZolam  0.5 mg Oral QHS   amiodarone  200 mg Oral Daily   aspirin EC  81 mg Oral  Daily   Chlorhexidine Gluconate Cloth  6 each Topical Q0600   Chlorhexidine Gluconate Cloth  6 each Topical Q0600   isosorbide mononitrate  90 mg Oral BID   levothyroxine  25 mcg Oral Daily   metoprolol tartrate  12.5 mg Oral BID   midodrine  5 mg Oral Q M,W,F-HD   multivitamin  1 tablet Oral Daily   rosuvastatin  10 mg Oral Daily   sevelamer carbonate  2,400 mg Oral TID WC   Continuous Infusions:  sodium chloride     sodium chloride       LOS: 5 days    Time spent: 35 minutes    Cartina Brousseau Darleen Crocker, DO [facility]  If 7PM-7AM, please contact night-coverage [email or web address] 07/21/2021, 9:27 AM

## 2021-07-21 NOTE — Progress Notes (Addendum)
Progress Note  Patient Name: Shawna [name] Date of Encounter: 07/21/2021  [facility] Cardiologist: Carlyle Dolly, MD   Subjective   BP 163/50 this AM.  Hgb appears stable since transfusion (8.0>8.1>8.3>7.8).  Denies any chest pain.  Inpatient Medications    Scheduled Meds:  ALPRAZolam  0.5 mg Oral QHS   amiodarone  200 mg Oral Daily   aspirin EC  81 mg Oral Daily   Chlorhexidine Gluconate Cloth  6 each Topical Q0600   Chlorhexidine Gluconate Cloth  6 each Topical Q0600   isosorbide mononitrate  90 mg Oral BID   levothyroxine  25 mcg Oral Daily   metoprolol tartrate  12.5 mg Oral BID   midodrine  5 mg Oral Q M,W,F-HD   multivitamin  1 tablet Oral Daily   rosuvastatin  10 mg Oral Daily   sevelamer carbonate  2,400 mg Oral TID WC   Continuous Infusions:  sodium chloride     sodium chloride     PRN Meds: sodium chloride, sodium chloride, bisacodyl, diphenhydrAMINE, hydrOXYzine, lidocaine (PF), lidocaine-prilocaine, nitroGLYCERIN, ondansetron **OR** ondansetron (ZOFRAN) IV, pentafluoroprop-tetrafluoroeth   Vital Signs    Vitals:   07/20/21 2100 07/21/21 0417 07/21/21 0628 07/21/21 0630  BP: (!) 176/54 (!) 180/58  (!) 163/50  Pulse: 64 65    Resp: 19 15    Temp: 98.5 F (36.9 C) 98.4 F (36.9 C)    TempSrc: Oral     SpO2: 100% 100%    Weight:   58.2 kg   Height:        Intake/Output Summary (Last 24 hours) at 07/21/2021 0815 Last data filed at 07/21/2021 0600 Gross per 24 hour  Intake 720 ml  Output --  Net 720 ml    Last 3 Weights 07/21/2021 07/20/2021 07/19/2021  Weight (lbs) 128 lb 6.4 oz 128 lb 12 oz 127 lb 6.8 oz  Weight (kg) 58.242 kg 58.4 kg 57.8 kg      Telemetry    NSR, HR in 50-60s. - Personally Reviewed  ECG    No new tracings.   Physical Exam   GEN: Pleasant elderly female appearing in no acute distress.   Neck: No JVD Cardiac: RRR, no murmurs, rubs, or gallops. 2/6 systolic murmur along Apex.  Respiratory: Clear to auscultation  bilaterally. GI: Soft, nontender, non-distended  MS: No pitting edema; No deformity. Neuro:  Nonfocal  Psych: Normal affect   Labs    High Sensitivity Troponin:   Recent Labs  Lab 07/16/21 0933 07/16/21 1436 07/16/21 1901 07/16/21 2120 07/16/21 2315  TROPONINIHS 313* 449* 356* 279* 251*      Chemistry Recent Labs  Lab 07/17/21 0401 07/19/21 0355 07/21/21 0451  NA 137 136 134*  K 4.5 5.2* 3.9  CL 100 94* 95*  CO2 29 24 26   GLUCOSE 93 73 95  BUN 25* 62* 52*  CREATININE 4.43* 8.94* 7.62*  CALCIUM 8.8* 9.1 9.4  ALBUMIN  --  3.2* 3.0*  GFRNONAA 9* 4* 5*  ANIONGAP 8 18* 13     Lipids  Recent Labs  Lab 07/16/21 0151  CHOL 131  TRIG 90  HDL 60  LDLCALC 53  CHOLHDL 2.2     Hematology Recent Labs  Lab 07/19/21 0616 07/20/21 0915 07/21/21 0451  WBC 6.6 5.5 5.6  RBC 2.22* 2.38* 2.15*  HGB 8.1* 8.3* 7.8*  HCT 25.6* 27.1* 24.4*  MCV 115.3* 113.9* 113.5*  MCH 36.5* 34.9* 36.3*  MCHC 31.6 30.6 32.0  RDW 23.1* 21.5* 20.7*  PLT 163 165 175     Radiology    No results found.  Cardiac Studies   Echocardiogram: 07/16/2021 IMPRESSIONS    1. Left ventricular ejection fraction, by estimation, is 30 to 35%. The  left ventricle has moderately decreased function. The left ventricle  demonstrates regional wall motion abnormalities (see scoring  diagram/findings for description). There is mild  left ventricular hypertrophy. Left ventricular diastolic parameters are  consistent with Grade II diastolic dysfunction (pseudonormalization).  Elevated left atrial pressure.   2. Right ventricular systolic function is moderately reduced. The right  ventricular size is normal. There is mildly elevated pulmonary artery  systolic pressure. The estimated right ventricular systolic pressure is  86.7 mmHg.   3. Left atrial size was severely dilated.   4. Right atrial size was mildly dilated.   5. The mitral valve is degenerative. Moderate mitral valve regurgitation.   Mild to moderate mitral stenosis. MG 17mmHg at 70bpm, MVA 1.2 cm^2 by  continuity equation. Moderate mitral annular calcification.   6. Tricuspid valve regurgitation is mild to moderate.   7. The aortic valve is tricuspid. Aortic valve regurgitation is not  visualized. Aortic valve sclerosis/calcification is present, without any  evidence of aortic stenosis.   8. The inferior vena cava is normal in size with greater than 50%  respiratory variability, suggesting right atrial pressure of 3 mmHg.   Patient Profile     81 y.o. female w/ PMH of CAD (s/p CABG in 2013, DES to D1 in 10/2016, cath in 07/2018 showing patent grafts with occlusion of D1 at prior stent site and progression of PDA disease --> medical management recommended), HFpEF, HTN, HLD, paroxysmal atrial fibrillation (not on anticoagulation given history of GIB), NSVT, chronic anemia and ESRD who is currently admitted for evaluation of recurrent atrial flutter and chest pain.    Assessment & Plan    1. NSTEMI - She presented with worsening chest pain and a productive cough. Her Imdur had accidently been reduced following her recent admission from 120mg  BID to 30mg  daily and she was taking SL NTG daily.  - Hs Troponin values this admission have peaked at 449 and were trending down to 251 on most recent check. Repeat echo shows her EF is now reduced at 30-35% (previously 50% in 03/2021) with the anterior wall, anterior septum, mid inferoseptal segment and apex being akinetic.  - Was initially on Heparin but now stopped given her anemia and requirement for transfusion. Hgb appears stable.  - Remains on ASA 81mg  daily, Imdur 90mg  BID, Lopressor 12.5mg  BID and Crestor 10mg  daily.  -Discussed with patient and her daughter.  They are agreeable with proceeding with cath, will transfer to [facility]. Risks and benefits of cardiac catheterization have been discussed with the patient.  These include bleeding, infection, kidney damage, stroke, heart  attack, death.  The patient understands these risks and is willing to proceed.   2. CAD - She is s/p CABG in 2013 with DES to D1 in 10/2016 and cath in 07/2018 showing patent grafts with occlusion of D1 at prior stent site and progression of PDA disease with medical management recommended. Was admitted for chest pain in 06/2021 and her case was reviewed with Interventional Cardiology at that time and her PDA was felt to be a poor target for PCI and medical management was recommended.  - Proceed with cath as above   3. HFrEF - Echo this admission shows a newly reduced EF of 30-35% with WMA as outlined above. Can  likely switch Lopressor to Toprol-XL prior to d/c pending BP trend. Continue Imdur. Doubt BP will allow for Hydralazine given her intermittent hypotension with HD (on Midodrine). No ACE-I/ARB/ARNI/Spiro/SGLT2 Inhibitor given her ESRD.  - Volume status has been managed by HD.    4. Paroxysmal Atrial Flutter/SVT - Brief episode of tachycardia but this was in the setting of Amiodarone and Lopressor being held. Continue Lopressor 12.5mg  BID (likely convert to Toprol-XL prior to d/c) and Amiodarone 200mg  daily.  - She has not been on anticoagulation due to her chronic anemia.    5. Anemia - She has a chronic anemia with baseline Hgb around 8. Dropped to 6.5 on 07/17/2021 and she received 1 unit pRBC's. Hgb now appears stable around 8.  FOBT was negative.   6. ESRD - Nephrology following. On HD.    For questions or updates, please contact [place] Please consult [email or web address] for contact info under      Signed, Donato Heinz, MD  07/21/2021, 8:15 AM

## 2021-07-22 ENCOUNTER — Inpatient Hospital Stay (HOSPITAL_COMMUNITY)
Admission: EM | Disposition: A | Discharge status: Home / Self Care | Payer: Self-pay | Source: Home / Self Care | Attending: Family Medicine

## 2021-07-22 DIAGNOSIS — I255 Ischemic cardiomyopathy: Secondary | ICD-10-CM

## 2021-07-22 DIAGNOSIS — I2510 Atherosclerotic heart disease of native coronary artery without angina pectoris: Secondary | ICD-10-CM | POA: Diagnosis not present

## 2021-07-22 HISTORY — PX: LEFT HEART CATH AND CORS/GRAFTS ANGIOGRAPHY: CATH118250

## 2021-07-22 LAB — BASIC METABOLIC PANEL
Anion gap: 11 (ref 5–15)
BUN: 32 mg/dL — ABNORMAL HIGH (ref 8–23)
CO2: 26 mmol/L (ref 22–32)
Calcium: 9.5 mg/dL (ref 8.9–10.3)
Chloride: 98 mmol/L (ref 98–111)
Creatinine, Ser: 5.57 mg/dL — ABNORMAL HIGH (ref 0.44–1.00)
GFR, Estimated: 7 mL/min — ABNORMAL LOW (ref >60)
Glucose, Bld: 83 mg/dL (ref 70–99)
Potassium: 4 mmol/L (ref 3.5–5.1)
Sodium: 135 mmol/L (ref 135–145)

## 2021-07-22 LAB — CBC
HCT: 25.7 % — ABNORMAL LOW (ref 36.0–46.0)
Hemoglobin: 8 g/dL — ABNORMAL LOW (ref 12.0–15.0)
MCH: 34.9 pg — ABNORMAL HIGH (ref 26.0–34.0)
MCHC: 31.1 g/dL (ref 30.0–36.0)
MCV: 112.2 fL — ABNORMAL HIGH (ref 80.0–100.0)
Platelets: 137 10*3/uL — ABNORMAL LOW (ref 150–400)
RBC: 2.29 MIL/uL — ABNORMAL LOW (ref 3.87–5.11)
RDW: 20.3 % — ABNORMAL HIGH (ref 11.5–15.5)
WBC: 4 10*3/uL (ref 4.0–10.5)
nRBC: 0 % (ref 0.0–0.2)

## 2021-07-22 LAB — MAGNESIUM: Magnesium: 1.9 mg/dL (ref 1.7–2.4)

## 2021-07-22 SURGERY — LEFT HEART CATH AND CORS/GRAFTS ANGIOGRAPHY
Anesthesia: LOCAL

## 2021-07-22 MED ORDER — SODIUM CHLORIDE 0.9% FLUSH: 3.0000 mL | Freq: Two times a day (BID) | INTRAVENOUS | Status: DC

## 2021-07-22 MED ORDER — IOHEXOL 350 MG/ML SOLN
INTRAVENOUS | Status: DC | PRN
  Administered 2021-07-22: 90 mL via INTRA_ARTERIAL

## 2021-07-22 MED ORDER — ASPIRIN 81 MG PO CHEW: 81.0000 mg | CHEWABLE_TABLET | ORAL | Status: DC

## 2021-07-22 MED ORDER — HYDRALAZINE HCL 20 MG/ML IJ SOLN
INTRAMUSCULAR | Status: AC
  Filled 2021-07-22: qty 1

## 2021-07-22 MED ORDER — LIDOCAINE HCL (PF) 1 % IJ SOLN
INTRAMUSCULAR | Status: DC | PRN
  Administered 2021-07-22: 15 mL via INTRADERMAL

## 2021-07-22 MED ORDER — MIDAZOLAM HCL 2 MG/2ML IJ SOLN
INTRAMUSCULAR | Status: AC
  Filled 2021-07-22: qty 2

## 2021-07-22 MED ORDER — SODIUM CHLORIDE 0.9 % IV SOLN: 250.0000 mL | INTRAVENOUS | Status: DC | PRN

## 2021-07-22 MED ORDER — SODIUM CHLORIDE 0.9% FLUSH: 3.0000 mL | INTRAVENOUS | Status: DC | PRN

## 2021-07-22 MED ORDER — FENTANYL CITRATE (PF) 100 MCG/2ML IJ SOLN
INTRAMUSCULAR | Status: AC
  Filled 2021-07-22: qty 2

## 2021-07-22 MED ORDER — HYDRALAZINE HCL 20 MG/ML IJ SOLN
10.0000 mg | INTRAMUSCULAR | Status: AC | PRN
  Administered 2021-07-22 (×2): 10 mg via INTRAVENOUS

## 2021-07-22 MED ORDER — DIPHENHYDRAMINE HCL 50 MG/ML IJ SOLN
INTRAMUSCULAR | Status: AC
  Filled 2021-07-22: qty 1

## 2021-07-22 MED ORDER — HEPARIN (PORCINE) IN NACL 1000-0.9 UT/500ML-% IV SOLN
INTRAVENOUS | Status: AC
  Filled 2021-07-22: qty 1000

## 2021-07-22 MED ORDER — SODIUM CHLORIDE 0.9 % IV SOLN: INTRAVENOUS | Status: DC

## 2021-07-22 MED ORDER — METHYLPREDNISOLONE SODIUM SUCC 125 MG IJ SOLR
125.0000 mg | Freq: Once | INTRAMUSCULAR | Status: AC
  Administered 2021-07-22: 125 mg via INTRAVENOUS
  Filled 2021-07-22: qty 2

## 2021-07-22 MED ORDER — SODIUM CHLORIDE 0.9% FLUSH
3.0000 mL | Freq: Two times a day (BID) | INTRAVENOUS | Status: DC
  Administered 2021-07-22 – 2021-07-23 (×2): 3 mL via INTRAVENOUS

## 2021-07-22 MED ORDER — ACETAMINOPHEN 325 MG PO TABS: 650.0000 mg | ORAL_TABLET | ORAL | Status: DC | PRN

## 2021-07-22 MED ORDER — NITROGLYCERIN 1 MG/10 ML FOR IR/CATH LAB
INTRA_ARTERIAL | Status: AC
  Filled 2021-07-22: qty 10

## 2021-07-22 MED ORDER — SODIUM CHLORIDE 0.9 % IV SOLN: INTRAVENOUS | Status: AC

## 2021-07-22 MED ORDER — DARBEPOETIN ALFA 150 MCG/0.3ML IJ SOSY
150.0000 ug | PREFILLED_SYRINGE | INTRAMUSCULAR | Status: DC
  Administered 2021-07-23: 150 ug via INTRAVENOUS
  Filled 2021-07-22 (×2): qty 0.3

## 2021-07-22 MED ORDER — ONDANSETRON HCL 4 MG/2ML IJ SOLN: 4.0000 mg | Freq: Four times a day (QID) | INTRAMUSCULAR | Status: DC | PRN

## 2021-07-22 MED ORDER — DIPHENHYDRAMINE HCL 50 MG/ML IJ SOLN
12.5000 mg | Freq: Four times a day (QID) | INTRAMUSCULAR | Status: DC | PRN
  Administered 2021-07-22 – 2021-07-23 (×3): 12.5 mg via INTRAVENOUS
  Filled 2021-07-22 (×2): qty 1

## 2021-07-22 MED ORDER — LABETALOL HCL 5 MG/ML IV SOLN: 10.0000 mg | INTRAVENOUS | Status: AC | PRN

## 2021-07-22 MED ORDER — DIPHENHYDRAMINE HCL 50 MG/ML IJ SOLN
25.0000 mg | Freq: Once | INTRAMUSCULAR | Status: AC
  Administered 2021-07-22: 25 mg via INTRAVENOUS
  Filled 2021-07-22: qty 1

## 2021-07-22 MED ORDER — HEPARIN (PORCINE) IN NACL 1000-0.9 UT/500ML-% IV SOLN
INTRAVENOUS | Status: DC | PRN
  Administered 2021-07-22 (×2): 500 mL

## 2021-07-22 MED ORDER — LIDOCAINE HCL (PF) 1 % IJ SOLN
INTRAMUSCULAR | Status: AC
  Filled 2021-07-22: qty 30

## 2021-07-22 SURGICAL SUPPLY — 9 items
CATH INFINITI 5FR MULTPACK ANG (CATHETERS) ×1 IMPLANT
GLIDESHEATH SLEND A-KIT 6F 22G (SHEATH) IMPLANT
KIT HEART LEFT (KITS) ×2 IMPLANT
PACK CARDIAC CATHETERIZATION (CUSTOM PROCEDURE TRAY) ×2 IMPLANT
SHEATH PINNACLE 5F 10CM (SHEATH) ×1 IMPLANT
TRANSDUCER W/STOPCOCK (MISCELLANEOUS) ×2 IMPLANT
TUBING CIL FLEX 10 FLL-RA (TUBING) ×2 IMPLANT
WIRE EMERALD 3MM-J .035X150CM (WIRE) ×1 IMPLANT
WIRE HI TORQ VERSACORE-J 145CM (WIRE) ×1 IMPLANT

## 2021-07-22 NOTE — Progress Notes (Signed)
Patient given precath 81mg  aspirin with small sip of water. Vitals stable. HR 56.

## 2021-07-22 NOTE — Progress Notes (Signed)
77fr Sheath aspirated and removed from right femoral artery. Manual pressure applied for 20 minutes. Site level 0 , no S+S of hematoma. Tegaderm dressing applied, bedrest instructions given.   Bilateral dp pulses present with doppler.  Left pt present with doppler, right pt absent, and was absent prior to procedure.   Bedrest begins @ 12:30:00

## 2021-07-22 NOTE — Progress Notes (Signed)
Pt with complaints of itching to bilateral feet and lower extremities, Spoke with Dr. Gwenlyn Found , IV benadryl order renewed and given, see MAR, lower extremities rubbed per pt request by RN, safety maintained

## 2021-07-22 NOTE — H&P (View-Only) (Signed)
Progress Note  Patient Name: Shawna [name] Date of Encounter: 07/22/2021  [facility] Cardiologist: Carlyle Dolly, MD   Subjective   Patient reported brief episode of chest pain this AM. Plan for transfer to [facility] for cath. BP mildly elevated.   Inpatient Medications    Scheduled Meds:  ALPRAZolam  0.5 mg Oral QHS   amiodarone  200 mg Oral Daily   aspirin  81 mg Oral Pre-Cath   aspirin EC  81 mg Oral Daily   Chlorhexidine Gluconate Cloth  6 each Topical Q0600   Chlorhexidine Gluconate Cloth  6 each Topical Q0600   diphenhydrAMINE  25 mg Intravenous Once   isosorbide mononitrate  90 mg Oral BID   levothyroxine  25 mcg Oral Daily   methylPREDNISolone sodium succinate  125 mg Intravenous Once   metoprolol tartrate  12.5 mg Oral BID   midodrine  5 mg Oral Q M,W,F-HD   multivitamin  1 tablet Oral Daily   rosuvastatin  10 mg Oral Daily   sevelamer carbonate  2,400 mg Oral TID WC   Continuous Infusions:  sodium chloride     sodium chloride     sodium chloride     PRN Meds: sodium chloride, sodium chloride, bisacodyl, diphenhydrAMINE, guaiFENesin-dextromethorphan, hydrOXYzine, ibuprofen, lidocaine (PF), lidocaine-prilocaine, nitroGLYCERIN, ondansetron **OR** ondansetron (ZOFRAN) IV, pentafluoroprop-tetrafluoroeth   Vital Signs    Vitals:   07/21/21 1526 07/21/21 2215 07/21/21 2218 07/22/21 0500  BP: (!) 157/85 (!) 139/41  (!) 155/46  Pulse: 62 (!) 46 (!) 58 (!) 56  Resp: 18 18  18   Temp: 98 F (36.7 C) 98.5 F (36.9 C)  97.6 F (36.4 C)  TempSrc: Oral Oral  Oral  SpO2: 98% 100% 100% 100%  Weight:      Height:        Intake/Output Summary (Last 24 hours) at 07/22/2021 0732 Last data filed at 07/21/2021 1900 Gross per 24 hour  Intake 120 ml  Output 2124 ml  Net -2004 ml   Last 3 Weights 07/21/2021 07/21/2021 07/21/2021  Weight (lbs) 122 lb 5.7 oz 128 lb 8.5 oz 128 lb 6.4 oz  Weight (kg) 55.5 kg 58.3 kg 58.242 kg      Telemetry    SB, HR 50, PACs/PVCs -  Personally Reviewed  ECG    No new - Personally Reviewed  Physical Exam   GEN: No acute distress.   Neck: No JVD Cardiac: bradycardic, RR, no murmurs, rubs, or gallops.  Respiratory: Clear to auscultation bilaterally. GI: Soft, nontender, non-distended  MS: No edema; No deformity. Neuro:  Nonfocal  Psych: Normal affect   Labs    High Sensitivity Troponin:   Recent Labs  Lab 07/16/21 0933 07/16/21 1436 07/16/21 1901 07/16/21 2120 07/16/21 2315  TROPONINIHS 313* 449* 356* 279* 251*     Chemistry Recent Labs  Lab 07/19/21 0355 07/21/21 0451 07/22/21 0415  NA 136 134* 135  K 5.2* 3.9 4.0  CL 94* 95* 98  CO2 24 26 26   GLUCOSE 73 95 83  BUN 62* 52* 32*  CREATININE 8.94* 7.62* 5.57*  CALCIUM 9.1 9.4 9.5  MG  --   --  1.9  ALBUMIN 3.2* 3.0*  --   GFRNONAA 4* 5* 7*  ANIONGAP 18* 13 11    Lipids  Recent Labs  Lab 07/16/21 0151  CHOL 131  TRIG 90  HDL 60  LDLCALC 53  CHOLHDL 2.2    Hematology Recent Labs  Lab 07/20/21 0915 07/21/21 0451 07/22/21 0415  WBC 5.5 5.6 4.0  RBC 2.38* 2.15* 2.29*  HGB 8.3* 7.8* 8.0*  HCT 27.1* 24.4* 25.7*  MCV 113.9* 113.5* 112.2*  MCH 34.9* 36.3* 34.9*  MCHC 30.6 32.0 31.1  RDW 21.5* 20.7* 20.3*  PLT 165 175 137*    Radiology    No results found.  Cardiac Studies   Echocardiogram: 07/16/2021 IMPRESSIONS    1. Left ventricular ejection fraction, by estimation, is 30 to 35%. The  left ventricle has moderately decreased function. The left ventricle  demonstrates regional wall motion abnormalities (see scoring  diagram/findings for description). There is mild  left ventricular hypertrophy. Left ventricular diastolic parameters are  consistent with Grade II diastolic dysfunction (pseudonormalization).  Elevated left atrial pressure.   2. Right ventricular systolic function is moderately reduced. The right  ventricular size is normal. There is mildly elevated pulmonary artery  systolic pressure. The estimated  right ventricular systolic pressure is  76.8 mmHg.   3. Left atrial size was severely dilated.   4. Right atrial size was mildly dilated.   5. The mitral valve is degenerative. Moderate mitral valve regurgitation.  Mild to moderate mitral stenosis. MG 82mmHg at 70bpm, MVA 1.2 cm^2 by  continuity equation. Moderate mitral annular calcification.   6. Tricuspid valve regurgitation is mild to moderate.   7. The aortic valve is tricuspid. Aortic valve regurgitation is not  visualized. Aortic valve sclerosis/calcification is present, without any  evidence of aortic stenosis.   8. The inferior vena cava is normal in size with greater than 50%  respiratory variability, suggesting right atrial pressure of 3 mmHg.   Patient Profile     81 y.o. female w/ PMH of CAD (s/p CABG in 2013, DES to D1 in 10/2016, cath in 07/2018 showing patent grafts with occlusion of D1 at prior stent site and progression of PDA disease --> medical management recommended), HFpEF, HTN, HLD, paroxysmal atrial fibrillation (not on anticoagulation given history of GIB), NSVT, chronic anemia and ESRD who is currently admitted for evaluation of recurrent atrial flutter and chest pain.    Assessment & Plan    NSTEMI CAD s/p CABG in 2013 with DES to D1 in 2018 - presented with chest pain HS trop peaked at 449.  - repeat echo showed reduced EF 30-35%, previously 50% by cho 03/2021 - IV heparin stopped for anemia - cath in 07/2018 showed patent grafts and occulusion of D1 at prior stet site and progression of PDA disease with medical management recommended - Admitted 06/2021 and case was reviewed with interventional cardiology and it was felt PDA to be a poor target for PCI and medical management was recommended - continue ASA, Imdur, Lopressor.  - plan for cath as above at [facility], will check on transfer status  HFrEF - Echo this admission showed reduced EF 30-35% with WMA - plan for cath as above - volume status per HD - continue  Lopressor>>can switch to Toprol at d/c - no ACEi/ARB/ARNI/spiro/SGLT2i 2/2 kidney disease  Paroxysmal Aflutter/SVT - brief episode of tachycardia in the setting of amio/Lopressor being held - continue rate control with Lopressor 12.5mg  BID - continue amiodarone 200mg  daily - no a/c with chronic anemia  Anemia - chronic anemia with baseline around 8 - Hgb down to 6.5 s/p 1 unit pRBCs - Hgb stable today at 8  ESRD - HD per nephrology  For questions or updates, please contact [facility] Please consult [email or web address] for contact info under        Signed, Cadence H  Kathlen Mody, PA-C  07/22/2021, 7:32 AM       Attending note:  Patient seen and examined.  She awaits transfer to [facility] for follow-up diagnostic cardiac catheterization.  Patient with worsening angina symptoms in the setting of known ischemic heart disease, also HFrEF with further reduction in EF to the range of 30 to 35%.  Peak high-sensitivity troponin I 449.  Cardiac catheterization in 2020 showed patent bypass grafts with occluded first diagonal at previous stent site and progressive PDA disease that was felt to be best managed medically.  Main question at this point is whether there are any new targets for revascularization.  Risks and benefits of a diagnostic cardiac catheterization has already been discussed with patient and her daughter, she is in agreement to proceed.  Comorbidities include ESRD on hemodialysis, paroxysmal atrial fibrillation/flutter, and also chronic anemia with recent acute decrease in hemoglobin to 6.5 without obvious bleeding source, stable at 8 at this point.  She is afebrile, heart rate in the 50s in sinus rhythm by telemetry, systolic 048G to 891Q.  Follow-up lab work shows potassium 4.0, BUN 32, creatinine 5.57, hemoglobin 8.0 and stable, platelets 137.  Continue aspirin, amiodarone, Imdur, Lopressor, Crestor, and ProAmatine.  Satira Sark, M.D., F.A.C.C.

## 2021-07-22 NOTE — Progress Notes (Signed)
Patient had uneventful shift.  Patient aware of procedure later today.  Patient is waiting on bed placement (patient on pending bed list in computer).  Patient has  been at baseline mentation.  Patient blood pressure medication held due to MD recommendation due to pulse.  Vitals stable. Patient bathed this am. No complaints at this time.

## 2021-07-22 NOTE — Progress Notes (Addendum)
Progress Note  Patient Name: Shawna [name] Date of Encounter: 07/22/2021  [facility] Cardiologist: Carlyle Dolly, MD   Subjective   Patient reported brief episode of chest pain this AM. Plan for transfer to [facility] for cath. BP mildly elevated.   Inpatient Medications    Scheduled Meds:  ALPRAZolam  0.5 mg Oral QHS   amiodarone  200 mg Oral Daily   aspirin  81 mg Oral Pre-Cath   aspirin EC  81 mg Oral Daily   Chlorhexidine Gluconate Cloth  6 each Topical Q0600   Chlorhexidine Gluconate Cloth  6 each Topical Q0600   diphenhydrAMINE  25 mg Intravenous Once   isosorbide mononitrate  90 mg Oral BID   levothyroxine  25 mcg Oral Daily   methylPREDNISolone sodium succinate  125 mg Intravenous Once   metoprolol tartrate  12.5 mg Oral BID   midodrine  5 mg Oral Q M,W,F-HD   multivitamin  1 tablet Oral Daily   rosuvastatin  10 mg Oral Daily   sevelamer carbonate  2,400 mg Oral TID WC   Continuous Infusions:  sodium chloride     sodium chloride     sodium chloride     PRN Meds: sodium chloride, sodium chloride, bisacodyl, diphenhydrAMINE, guaiFENesin-dextromethorphan, hydrOXYzine, ibuprofen, lidocaine (PF), lidocaine-prilocaine, nitroGLYCERIN, ondansetron **OR** ondansetron (ZOFRAN) IV, pentafluoroprop-tetrafluoroeth   Vital Signs    Vitals:   07/21/21 1526 07/21/21 2215 07/21/21 2218 07/22/21 0500  BP: (!) 157/85 (!) 139/41  (!) 155/46  Pulse: 62 (!) 46 (!) 58 (!) 56  Resp: 18 18  18   Temp: 98 F (36.7 C) 98.5 F (36.9 C)  97.6 F (36.4 C)  TempSrc: Oral Oral  Oral  SpO2: 98% 100% 100% 100%  Weight:      Height:        Intake/Output Summary (Last 24 hours) at 07/22/2021 0732 Last data filed at 07/21/2021 1900 Gross per 24 hour  Intake 120 ml  Output 2124 ml  Net -2004 ml   Last 3 Weights 07/21/2021 07/21/2021 07/21/2021  Weight (lbs) 122 lb 5.7 oz 128 lb 8.5 oz 128 lb 6.4 oz  Weight (kg) 55.5 kg 58.3 kg 58.242 kg      Telemetry    SB, HR 50, PACs/PVCs -  Personally Reviewed  ECG    No new - Personally Reviewed  Physical Exam   GEN: No acute distress.   Neck: No JVD Cardiac: bradycardic, RR, no murmurs, rubs, or gallops.  Respiratory: Clear to auscultation bilaterally. GI: Soft, nontender, non-distended  MS: No edema; No deformity. Neuro:  Nonfocal  Psych: Normal affect   Labs    High Sensitivity Troponin:   Recent Labs  Lab 07/16/21 0933 07/16/21 1436 07/16/21 1901 07/16/21 2120 07/16/21 2315  TROPONINIHS 313* 449* 356* 279* 251*     Chemistry Recent Labs  Lab 07/19/21 0355 07/21/21 0451 07/22/21 0415  NA 136 134* 135  K 5.2* 3.9 4.0  CL 94* 95* 98  CO2 24 26 26   GLUCOSE 73 95 83  BUN 62* 52* 32*  CREATININE 8.94* 7.62* 5.57*  CALCIUM 9.1 9.4 9.5  MG  --   --  1.9  ALBUMIN 3.2* 3.0*  --   GFRNONAA 4* 5* 7*  ANIONGAP 18* 13 11    Lipids  Recent Labs  Lab 07/16/21 0151  CHOL 131  TRIG 90  HDL 60  LDLCALC 53  CHOLHDL 2.2    Hematology Recent Labs  Lab 07/20/21 0915 07/21/21 0451 07/22/21 0415  WBC 5.5 5.6 4.0  RBC 2.38* 2.15* 2.29*  HGB 8.3* 7.8* 8.0*  HCT 27.1* 24.4* 25.7*  MCV 113.9* 113.5* 112.2*  MCH 34.9* 36.3* 34.9*  MCHC 30.6 32.0 31.1  RDW 21.5* 20.7* 20.3*  PLT 165 175 137*    Radiology    No results found.  Cardiac Studies   Echocardiogram: 07/16/2021 IMPRESSIONS    1. Left ventricular ejection fraction, by estimation, is 30 to 35%. The  left ventricle has moderately decreased function. The left ventricle  demonstrates regional wall motion abnormalities (see scoring  diagram/findings for description). There is mild  left ventricular hypertrophy. Left ventricular diastolic parameters are  consistent with Grade II diastolic dysfunction (pseudonormalization).  Elevated left atrial pressure.   2. Right ventricular systolic function is moderately reduced. The right  ventricular size is normal. There is mildly elevated pulmonary artery  systolic pressure. The estimated  right ventricular systolic pressure is  42.5 mmHg.   3. Left atrial size was severely dilated.   4. Right atrial size was mildly dilated.   5. The mitral valve is degenerative. Moderate mitral valve regurgitation.  Mild to moderate mitral stenosis. MG 69mmHg at 70bpm, MVA 1.2 cm^2 by  continuity equation. Moderate mitral annular calcification.   6. Tricuspid valve regurgitation is mild to moderate.   7. The aortic valve is tricuspid. Aortic valve regurgitation is not  visualized. Aortic valve sclerosis/calcification is present, without any  evidence of aortic stenosis.   8. The inferior vena cava is normal in size with greater than 50%  respiratory variability, suggesting right atrial pressure of 3 mmHg.   Patient Profile     81 y.o. female w/ PMH of CAD (s/p CABG in 2013, DES to D1 in 10/2016, cath in 07/2018 showing patent grafts with occlusion of D1 at prior stent site and progression of PDA disease --> medical management recommended), HFpEF, HTN, HLD, paroxysmal atrial fibrillation (not on anticoagulation given history of GIB), NSVT, chronic anemia and ESRD who is currently admitted for evaluation of recurrent atrial flutter and chest pain.    Assessment & Plan    NSTEMI CAD s/p CABG in 2013 with DES to D1 in 2018 - presented with chest pain HS trop peaked at 449.  - repeat echo showed reduced EF 30-35%, previously 50% by cho 03/2021 - IV heparin stopped for anemia - cath in 07/2018 showed patent grafts and occulusion of D1 at prior stet site and progression of PDA disease with medical management recommended - Admitted 06/2021 and case was reviewed with interventional cardiology and it was felt PDA to be a poor target for PCI and medical management was recommended - continue ASA, Imdur, Lopressor.  - plan for cath as above at [facility], will check on transfer status  HFrEF - Echo this admission showed reduced EF 30-35% with WMA - plan for cath as above - volume status per HD - continue  Lopressor>>can switch to Toprol at d/c - no ACEi/ARB/ARNI/spiro/SGLT2i 2/2 kidney disease  Paroxysmal Aflutter/SVT - brief episode of tachycardia in the setting of amio/Lopressor being held - continue rate control with Lopressor 12.5mg  BID - continue amiodarone 200mg  daily - no a/c with chronic anemia  Anemia - chronic anemia with baseline around 8 - Hgb down to 6.5 s/p 1 unit pRBCs - Hgb stable today at 8  ESRD - HD per nephrology  For questions or updates, please contact [place] [facility] Please consult [email or web address] for contact info under        Signed, Cadence H  Kathlen Mody, PA-C  07/22/2021, 7:32 AM       Attending note:  Patient seen and examined.  She awaits transfer to [facility] for follow-up diagnostic cardiac catheterization.  Patient with worsening angina symptoms in the setting of known ischemic heart disease, also HFrEF with further reduction in EF to the range of 30 to 35%.  Peak high-sensitivity troponin I 449.  Cardiac catheterization in 2020 showed patent bypass grafts with occluded first diagonal at previous stent site and progressive PDA disease that was felt to be best managed medically.  Main question at this point is whether there are any new targets for revascularization.  Risks and benefits of a diagnostic cardiac catheterization has already been discussed with patient and her daughter, she is in agreement to proceed.  Comorbidities include ESRD on hemodialysis, paroxysmal atrial fibrillation/flutter, and also chronic anemia with recent acute decrease in hemoglobin to 6.5 without obvious bleeding source, stable at 8 at this point.  She is afebrile, heart rate in the 50s in sinus rhythm by telemetry, systolic 802M to 336P.  Follow-up lab work shows potassium 4.0, BUN 32, creatinine 5.57, hemoglobin 8.0 and stable, platelets 137.  Continue aspirin, amiodarone, Imdur, Lopressor, Crestor, and ProAmatine.  Satira Sark, M.D., F.A.C.C.

## 2021-07-22 NOTE — Progress Notes (Signed)
Admit: 07/15/2021 LOS: 6  47F ESRD MWF [facility] with CP  Subjective:  No acute events overnight. Currently CP free. Pending transfer to [facility] for cath Tolerated HD yesterday, net uf 2124  01/11 0701 - 01/12 0700 In: 120 [P.O.:120] Out: 2124   Filed Weights   07/21/21 0628 07/21/21 1100 07/21/21 1505  Weight: 58.2 kg 58.3 kg 55.5 kg    Scheduled Meds:  ALPRAZolam  0.5 mg Oral QHS   amiodarone  200 mg Oral Daily   aspirin  81 mg Oral Pre-Cath   aspirin EC  81 mg Oral Daily   Chlorhexidine Gluconate Cloth  6 each Topical Q0600   Chlorhexidine Gluconate Cloth  6 each Topical Q0600   diphenhydrAMINE  25 mg Intravenous Once   isosorbide mononitrate  90 mg Oral BID   levothyroxine  25 mcg Oral Daily   methylPREDNISolone sodium succinate  125 mg Intravenous Once   metoprolol tartrate  12.5 mg Oral BID   midodrine  5 mg Oral Q M,W,F-HD   multivitamin  1 tablet Oral Daily   rosuvastatin  10 mg Oral Daily   sevelamer carbonate  2,400 mg Oral TID WC   Continuous Infusions:  sodium chloride     sodium chloride     sodium chloride     PRN Meds:.sodium chloride, sodium chloride, bisacodyl, diphenhydrAMINE, guaiFENesin-dextromethorphan, hydrOXYzine, ibuprofen, lidocaine (PF), lidocaine-prilocaine, nitroGLYCERIN, ondansetron **OR** ondansetron (ZOFRAN) IV, pentafluoroprop-tetrafluoroeth  Current Labs: reviewed    Physical Exam:  Blood pressure (!) 155/46, pulse (!) 56, temperature 97.6 F (36.4 C), temperature source Oral, resp. rate 18, height 5\' 1"  (1.549 m), weight 55.5 kg, SpO2 100 %. Gen: elderly female, NAD, comfortable CV: s1s2, rrr Resp: cta bl Abd: soft, nt/nd Ext: no edema Neuro: awake, alert, speech clear and coherent, moves all ext spontaneously Dialysis access: LUE AVG +b/t    Recent Labs  Lab 07/17/21 0401 07/19/21 0355 07/21/21 0451 07/22/21 0415  NA 137 136 134* 135  K 4.5 5.2* 3.9 4.0  CL 100 94* 95* 98  CO2 29 24 26 26   GLUCOSE 93 73 95 83  BUN 25*  62* 52* 32*  CREATININE 4.43* 8.94* 7.62* 5.57*  CALCIUM 8.8* 9.1 9.4 9.5  PHOS 3.7 3.9 3.8  --    Recent Labs  Lab 07/20/21 0915 07/21/21 0451 07/22/21 0415  WBC 5.5 5.6 4.0  HGB 8.3* 7.8* 8.0*  HCT 27.1* 24.4* 25.7*  MCV 113.9* 113.5* 112.2*  PLT 165 175 137*     Dialysis Orders:  [facility] - MWF-  3.5 hours  EDW 54. HD Bath 2k/2.5 ca, Cellent 17H KOA, Heparin none. Access LUE AVG BFR 300, DFR 500- 15 guage. Micera 200 mcg IV every 2 weeks  A ESRD MWF, on schedule: 2K, 2-3L UF, no heparin, AVG, 3.5h, 300/500 BFR/DFR. Anuric NSTEMI, known severe CAD,CP, resolved, per TRH and cardiology Anemia, had some decline inpaitent, rec'd 1u PRBC. Stool occult neg 1/10. 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 Morphine should not be used.  Baclofen should be avoided Avoid oral sodium phosphate and magnesium citrate based laxatives / bowel preps   Gean Quint, MD [place]

## 2021-07-22 NOTE — Progress Notes (Signed)
PROGRESS NOTE    Shawna [name]  SWN:[id number] DOB: May 31, 1941 DOA: 07/15/2021 PCP: [facility]   Brief Narrative:   81 y.o. female with medical history significant for significant coronary artery disease, unstable angina, status post CABG in 2013, hypothyroidism, carotid artery disease, end-stage renal disease on hemodialysis, chronic diastolic heart failure, history of colon cancer, colonic AVM, osteoarthritis, GERD, multiple hospitalizations for chest pain recently discharged from [facility] on 07/08/2021 after being admitted for chest pain concerning for unstable angina.  During the hospitalization her Imdur was decreased to 30 mg daily due to soft blood pressures.  It was also noted that she had a history of paroxysmal atrial fibrillation.  She had not been anticoagulated secondary to GI bleeding and had been treated with amiodarone and metoprolol.  She was admitted with rising troponins and started on IV heparin infusion by cardiology team.  IV heparin drip had to be discontinued after hemoglobin drop was noted.  She did receive 1 unit PRBC transfusion and is currently demonstrating stable hemoglobin levels.  No occult bleed noted.  She is in stable condition for transfer to [facility] for cardiac catheterization anticipated 1/12.  Assessment & Plan:   Principal Problem:   Ischemic chest pain ([place]) Active Problems:   Atherosclerotic heart disease of native coronary artery without angina pectoris   Essential hypertension, benign   ESRD on hemodialysis (HCC)   HLD (hyperlipidemia)   Acute combined systolic and diastolic heart failure (HCC)   GERD (gastroesophageal reflux disease)   AVM (arteriovenous malformation) of small bowel, acquired   Anemia in chronic kidney disease   Hx of CABG   Carotid artery disease (HCC)   Unstable angina (HCC)   Non-ST elevation (NSTEMI) myocardial infarction (HCC)   PAF (paroxysmal atrial fibrillation) (HCC)   Chest pain   Chest pain  unstable angina NSTEMI - pt was initially placed on IV heparin infusion by cardiology team, currently chest pain-free - unfortunately heparin had to be discontinued to to acute drop in Hg 8.1 to 6.5 - HIT labs obtained and pending - Pt is chest pain free now, no plan to restart heparin at this time - imdur dose increased by cardiology team to 90 mg BID, had been reduced to 30 mg at last hospitalization  - If BP tolerates, will continue current imdur dosing.  - continue rosuvastatin 10 mg daily  - continue aspirin 81 mg daily  - fortunately troponins have plateaued and started trending down - with Hg down to 6.5, transfused 1 unit PRBC 1/7, Hg up to 8.0>7.8; stable - she has done so much better after they increased her imdur to 90 mg BID - given new cardiomyopathy EF 30-35% she has been recommended cardiac catheterization and will be transferred to [facility], patient to be n.p.o. after midnight   Anemia in CKD / acute blood loss anemia-currently stable - heparin discontinued - transfused 1 unit PRBC, Hg up to 7.8 from 6.5 - CBC to be followed   Thrombocytopenia-improved - samples have been reported as clumped - HIT panel labs ordered - heparin discontinued on 07/17/21 -Follow CBC   HFrEF - volume removal with HD - last HD done on 07/16/21 - EF now down to 30-35% which is concerning -Plan for cardiac catheterization on 1/12   Paroxysmal A flutter/SVT-rate controlled - continue home amiodarone 200 mg daily and metoprolol 12.5 mg BID   ESRD on hemodialysis  - HD MWF, last treatment successfully completed 07/16/21, 07/19/21, and 1/11, next on 1/13 -  nephrology team consulted and following    Viral URI - improved now - discontinued doxycycline given low procalcitonin    DVT prophylaxis: SCD Code Status: full  Family Communication: husband updated  1/11 Disposition: transfer to [facility] for cath per cardiology team  Status is: Inpatient Remains inpatient appropriate because: IV treatments  required, PRBC transfusion    Consultants:  Nephrology Cardiology    Procedures:  Hemodialysis 07/16/21 Echocardiogram / IMPRESSIONS   1. Left ventricular ejection fraction, by estimation, is 30 to 35%. The left ventricle has moderately decreased function. The left ventricle demonstrates regional wall motion abnormalities (see scoring diagram/findings for description). There is mild left ventricular hypertrophy. Left ventricular diastolic parameters are  consistent with Grade II diastolic dysfunction (pseudonormalization). Elevated left atrial pressure.   2. Right ventricular systolic function is moderately reduced. The right ventricular size is normal. There is mildly elevated pulmonary artery systolic pressure. The estimated right ventricular systolic pressure is  70.2 mmHg.   3. Left atrial size was severely dilated.   4. Right atrial size was mildly dilated.   5. The mitral valve is degenerative. Moderate mitral valve regurgitation. Mild to moderate mitral stenosis. MG 12mmHg at 70bpm, MVA 1.2 cm^2 by continuity equation. Moderate mitral annular calcification.   6. Tricuspid valve regurgitation is mild to moderate.   7. The aortic valve is tricuspid. Aortic valve regurgitation is not visualized. Aortic valve sclerosis/calcification is present, without any evidence of aortic stenosis.   8. The inferior vena cava is normal in size with greater than 50% respiratory variability, suggesting right atrial pressure of 3 mmHg.   Subjective: Patient seen and evaluated today with no new acute complaints or concerns aside from brief episode of chest pain this morning. No acute concerns or events noted overnight.  She is currently awaiting transfer to [facility] for cardiac catheterization.  Objective: Vitals:   07/21/21 1526 07/21/21 2215 07/21/21 2218 07/22/21 0500  BP: (!) 157/85 (!) 139/41  (!) 155/46  Pulse: 62 (!) 46 (!) 58 (!) 56  Resp: 18 18  18   Temp: 98 F (36.7 C) 98.5 F (36.9 C)  97.6  F (36.4 C)  TempSrc: Oral Oral  Oral  SpO2: 98% 100% 100% 100%  Weight:      Height:        Intake/Output Summary (Last 24 hours) at 07/22/2021 0837 Last data filed at 07/21/2021 1900 Gross per 24 hour  Intake 120 ml  Output 2124 ml  Net -2004 ml   Filed Weights   07/21/21 0628 07/21/21 1100 07/21/21 1505  Weight: 58.2 kg 58.3 kg 55.5 kg    Examination:  General exam: Appears calm and comfortable  Respiratory system: Clear to auscultation. Respiratory effort normal. Cardiovascular system: S1 & S2 heard, RRR.  Gastrointestinal system: Abdomen is soft Central nervous system: Alert and awake Extremities: No edema Skin: No significant lesions noted Psychiatry: Flat affect.    Data Reviewed: I have personally reviewed following labs and imaging studies  CBC: Recent Labs  Lab 07/18/21 0443 07/19/21 0616 07/20/21 0915 07/21/21 0451 07/22/21 0415  WBC 6.2 6.6 5.5 5.6 4.0  HGB 8.0* 8.1* 8.3* 7.8* 8.0*  HCT 25.7* 25.6* 27.1* 24.4* 25.7*  MCV 115.8* 115.3* 113.9* 113.5* 112.2*  PLT 68* 163 165 175 637*   Basic Metabolic Panel: Recent Labs  Lab 07/15/21 2336 07/17/21 0401 07/19/21 0355 07/21/21 0451 07/22/21 0415  NA 139 137 136 134* 135  K 4.8 4.5 5.2* 3.9 4.0  CL 101 100 94* 95*  98  CO2 24 29 24 26 26   GLUCOSE 154* 93 73 95 83  BUN 36* 25* 62* 52* 32*  CREATININE 6.22* 4.43* 8.94* 7.62* 5.57*  CALCIUM 9.1 8.8* 9.1 9.4 9.5  MG  --   --   --   --  1.9  PHOS  --  3.7 3.9 3.8  --    GFR: Estimated Creatinine Clearance: 6 mL/min (A) (by C-G formula based on SCr of 5.57 mg/dL (H)). Liver Function Tests: Recent Labs  Lab 07/19/21 0355 07/21/21 0451  ALBUMIN 3.2* 3.0*   No results for input(s): LIPASE, AMYLASE in the last 168 hours. No results for input(s): AMMONIA in the last 168 hours. Coagulation Profile: No results for input(s): INR, PROTIME in the last 168 hours. Cardiac Enzymes: No results for input(s): CKTOTAL, CKMB, CKMBINDEX, TROPONINI in the  last 168 hours. BNP (last 3 results) No results for input(s): PROBNP in the last 8760 hours. HbA1C: No results for input(s): HGBA1C in the last 72 hours. CBG: Recent Labs  Lab 07/19/21 2041  GLUCAP 113*   Lipid Profile: No results for input(s): CHOL, HDL, LDLCALC, TRIG, CHOLHDL, LDLDIRECT in the last 72 hours. Thyroid Function Tests: No results for input(s): TSH, T4TOTAL, FREET4, T3FREE, THYROIDAB in the last 72 hours. Anemia Panel: No results for input(s): VITAMINB12, FOLATE, FERRITIN, TIBC, IRON, RETICCTPCT in the last 72 hours. Sepsis Labs: Recent Labs  Lab 07/16/21 0151  PROCALCITON 0.12    Recent Results (from the past 240 hour(s))  Resp Panel by RT-PCR (Flu A&B, Covid) Nasopharyngeal Swab     Status: None   Collection Time: 07/16/21  4:11 AM   Specimen: Nasopharyngeal Swab; Nasopharyngeal(NP) swabs in vial transport medium  Result Value Ref Range Status   SARS Coronavirus 2 by RT PCR NEGATIVE NEGATIVE Final    Comment: (NOTE) SARS-CoV-2 target nucleic acids are NOT DETECTED.  The SARS-CoV-2 RNA is generally detectable in upper respiratory specimens during the acute phase of infection. The lowest concentration of SARS-CoV-2 viral copies this assay can detect is 138 copies/mL. A negative result does not preclude SARS-Cov-2 infection and should not be used as the sole basis for treatment or other patient management decisions. A negative result may occur with  improper specimen collection/handling, submission of specimen other than nasopharyngeal swab, presence of viral mutation(s) within the areas targeted by this assay, and inadequate number of viral copies(<138 copies/mL). A negative result must be combined with clinical observations, patient history, and epidemiological information. The expected result is Negative.  Fact Sheet for Patients:  EntrepreneurPulse.com.au  Fact Sheet for Healthcare Providers:   IncredibleEmployment.be  This test is no t yet approved or cleared by the [place] FDA and  has been authorized for detection and/or diagnosis of SARS-CoV-2 by FDA under an Emergency Use Authorization (EUA). This EUA will remain  in effect (meaning this test can be used) for the duration of the COVID-19 declaration under Section 564(b)(1) of the Act, 21 U.S.C.section 360bbb-3(b)(1), unless the authorization is terminated  or revoked sooner.       Influenza A by PCR NEGATIVE NEGATIVE Final   Influenza B by PCR NEGATIVE NEGATIVE Final    Comment: (NOTE) The Xpert Xpress SARS-CoV-2/FLU/RSV plus assay is intended as an aid in the diagnosis of influenza from Nasopharyngeal swab specimens and should not be used as a sole basis for treatment. Nasal washings and aspirates are unacceptable for Xpert Xpress SARS-CoV-2/FLU/RSV testing.  Fact Sheet for Patients: EntrepreneurPulse.com.au  Fact Sheet for Healthcare Providers: IncredibleEmployment.be  This test is not yet approved or cleared by the [place] and has been authorized for detection and/or diagnosis of SARS-CoV-2 by FDA under an Emergency Use Authorization (EUA). This EUA will remain in effect (meaning this test can be used) for the duration of the COVID-19 declaration under Section 564(b)(1) of the Act, 21 U.S.C. section 360bbb-3(b)(1), unless the authorization is terminated or revoked.  Performed at [facility], [address] [place], [place] [address]   Culture, blood (Routine X 2) w Reflex to ID Panel     Status: None   Collection Time: 07/16/21  6:31 AM   Specimen: BLOOD RIGHT HAND  Result Value Ref Range Status   Specimen Description BLOOD RIGHT HAND  Final   Special Requests   Final    BOTTLES DRAWN AEROBIC ONLY Blood Culture results may not be optimal due to an inadequate volume of blood received in culture bottles   Culture   Final    NO GROWTH 5  DAYS Performed at [facility], [address] [place], [place] [address]    Report Status 07/21/2021 FINAL  Final  Culture, blood (Routine X 2) w Reflex to ID Panel     Status: None   Collection Time: 07/16/21  6:31 AM   Specimen: Right Antecubital; Blood  Result Value Ref Range Status   Specimen Description RIGHT ANTECUBITAL  Final   Special Requests   Final    BOTTLES DRAWN AEROBIC ONLY Blood Culture adequate volume   Culture   Final    NO GROWTH 5 DAYS Performed at [facility], [address], [place], [place] [address]    Report Status 07/21/2021 FINAL  Final  Culture, blood (routine x 2)     Status: None   Collection Time: 07/16/21  5:40 PM   Specimen: Porta Cath; Blood  Result Value Ref Range Status   Specimen Description PORTA CATH ARTERIAL AVF  Final   Special Requests   Final    BOTTLES DRAWN AEROBIC AND ANAEROBIC Blood Culture adequate volume   Culture   Final    NO GROWTH 5 DAYS Performed at [facility], [address], [name], [place] [address]    Report Status 07/21/2021 FINAL  Final  Culture, blood (routine x 2)     Status: None   Collection Time: 07/16/21  5:55 PM   Specimen: Porta Cath; Blood  Result Value Ref Range Status   Specimen Description PORTA CATH VENOUS AVF  Final   Special Requests   Final    BOTTLES DRAWN AEROBIC AND ANAEROBIC Blood Culture adequate volume   Culture   Final    NO GROWTH 5 DAYS Performed at [facility], [address], [place], [place] [address]    Report Status 07/21/2021 FINAL  Final  MRSA Next Gen by PCR, Nasal     Status: None   Collection Time: 07/16/21  7:14 PM   Specimen: Nasal Mucosa; Nasal Swab  Result Value Ref Range Status   MRSA by PCR Next Gen NOT DETECTED NOT DETECTED Final    Comment: (NOTE) The GeneXpert MRSA Assay (FDA approved for NASAL specimens only), is one component of a comprehensive MRSA colonization surveillance program. It is not intended to diagnose MRSA infection nor to guide or monitor  treatment for MRSA infections. Test performance is not FDA approved in patients less than 51 years old. Performed at [facility], [address], [place], [place] [address]          Radiology Studies:  No results found.      Scheduled Meds:  ALPRAZolam  0.5 mg Oral QHS   amiodarone  200 mg Oral Daily   aspirin  81 mg Oral Pre-Cath   aspirin EC  81 mg Oral Daily   Chlorhexidine Gluconate Cloth  6 each Topical Q0600   Chlorhexidine Gluconate Cloth  6 each Topical Q0600   [START ON 07/23/2021] darbepoetin (ARANESP) injection - DIALYSIS  150 mcg Intravenous Q Fri-HD   diphenhydrAMINE  25 mg Intravenous Once   isosorbide mononitrate  90 mg Oral BID   levothyroxine  25 mcg Oral Daily   methylPREDNISolone sodium succinate  125 mg Intravenous Once   metoprolol tartrate  12.5 mg Oral BID   midodrine  5 mg Oral Q M,W,F-HD   multivitamin  1 tablet Oral Daily   rosuvastatin  10 mg Oral Daily   sevelamer carbonate  2,400 mg Oral TID WC   Continuous Infusions:  sodium chloride     sodium chloride     sodium chloride       LOS: 6 days    Time spent: 35 minutes    [place] Masaki Darleen Crocker, DO [facility]  If 7PM-7AM, please contact night-coverage [email or web address] 07/22/2021, 8:37 AM

## 2021-07-22 NOTE — Interval H&P Note (Signed)
Cath Lab Visit (complete for each Cath Lab visit)  Clinical Evaluation Leading to the Procedure:   ACS: Yes.    Non-ACS:    Anginal Classification: CCS II  Anti-ischemic medical therapy: Minimal Therapy (1 class of medications)  Non-Invasive Test Results: No non-invasive testing performed  Prior CABG: Previous CABG      History and Physical Interval Note:  07/22/2021 11:21 AM  Shawna [name]  has presented today for surgery, with the diagnosis of cardiomyopathy.  The various methods of treatment have been discussed with the patient and family. After consideration of risks, benefits and other options for treatment, the patient has consented to  Procedure(s): LEFT HEART CATH AND CORS/GRAFTS ANGIOGRAPHY (N/A) as a surgical intervention.  The patient's history has been reviewed, patient examined, no change in status, stable for surgery.  I have reviewed the patient's chart and labs.  Questions were answered to the patient's satisfaction.     Quay Burow

## 2021-07-22 NOTE — Progress Notes (Signed)
Patient has a pulse in the 40s when vitals taken and 56 when taken manually.  Blood pressure WNL.  Notified MD if night medications should be given (IMDUR and Lopressor).  Received permission from MD to hold medications for now due to pulse.

## 2021-07-22 NOTE — Progress Notes (Signed)
Patient IV pre meds given.

## 2021-07-23 ENCOUNTER — Encounter (HOSPITAL_COMMUNITY): Payer: Self-pay | Admitting: Cardiovascular Disease

## 2021-07-23 DIAGNOSIS — I209 Angina pectoris, unspecified: Secondary | ICD-10-CM | POA: Diagnosis not present

## 2021-07-23 DIAGNOSIS — J9600 Acute respiratory failure, unspecified whether with hypoxia or hypercapnia: Secondary | ICD-10-CM

## 2021-07-23 LAB — CBC
HCT: 28.7 % — ABNORMAL LOW (ref 36.0–46.0)
Hemoglobin: 9.5 g/dL — ABNORMAL LOW (ref 12.0–15.0)
MCH: 36 pg — ABNORMAL HIGH (ref 26.0–34.0)
MCHC: 33.1 g/dL (ref 30.0–36.0)
MCV: 108.7 fL — ABNORMAL HIGH (ref 80.0–100.0)
Platelets: UNDETERMINED 10*3/uL (ref 150–400)
RBC: 2.64 MIL/uL — ABNORMAL LOW (ref 3.87–5.11)
RDW: 20.6 % — ABNORMAL HIGH (ref 11.5–15.5)
WBC: 10.8 10*3/uL — ABNORMAL HIGH (ref 4.0–10.5)
nRBC: 0 % (ref 0.0–0.2)

## 2021-07-23 LAB — BASIC METABOLIC PANEL
Anion gap: 15 (ref 5–15)
BUN: 52 mg/dL — ABNORMAL HIGH (ref 8–23)
CO2: 24 mmol/L (ref 22–32)
Calcium: 9.6 mg/dL (ref 8.9–10.3)
Chloride: 91 mmol/L — ABNORMAL LOW (ref 98–111)
Creatinine, Ser: 7.83 mg/dL — ABNORMAL HIGH (ref 0.44–1.00)
GFR, Estimated: 5 mL/min — ABNORMAL LOW (ref >60)
Glucose, Bld: 133 mg/dL — ABNORMAL HIGH (ref 70–99)
Potassium: 4.5 mmol/L (ref 3.5–5.1)
Sodium: 130 mmol/L — ABNORMAL LOW (ref 135–145)

## 2021-07-23 LAB — MAGNESIUM: Magnesium: 1.9 mg/dL (ref 1.7–2.4)

## 2021-07-23 LAB — HEPATITIS B CORE ANTIBODY, TOTAL: Hep B Core Total Ab: NONREACTIVE

## 2021-07-23 LAB — HEPATITIS B SURFACE ANTIGEN: Hepatitis B Surface Ag: NONREACTIVE

## 2021-07-23 LAB — HEPATITIS B SURFACE ANTIBODY,QUALITATIVE: Hep B S Ab: REACTIVE — AB

## 2021-07-23 MED FILL — Nitroglycerin IV Soln 100 MCG/ML in D5W: INTRA_ARTERIAL | Qty: 10 | Status: CN

## 2021-07-23 NOTE — Progress Notes (Signed)
Admit: 07/15/2021 LOS: 7  22F ESRD MWF [facility] with CP  Subjective:  Got cath yesterday here at [facility], transferred from [facility]-- looks like NSTEMI is demand ischemia  No intake/output data recorded.  Filed Weights   07/21/21 1100 07/21/21 1505 07/22/21 1505  Weight: 58.3 kg 55.5 kg 59.4 kg    Scheduled Meds:  ALPRAZolam  0.5 mg Oral QHS   amiodarone  200 mg Oral Daily   aspirin EC  81 mg Oral Daily   darbepoetin (ARANESP) injection - DIALYSIS  150 mcg Intravenous Q Fri-HD   isosorbide mononitrate  90 mg Oral BID   levothyroxine  25 mcg Oral Daily   metoprolol tartrate  12.5 mg Oral BID   midodrine  5 mg Oral Q M,W,F-HD   multivitamin  1 tablet Oral Daily   rosuvastatin  10 mg Oral Daily   sevelamer carbonate  2,400 mg Oral TID WC   sodium chloride flush  3 mL Intravenous Q12H   Continuous Infusions:  sodium chloride     sodium chloride     sodium chloride     sodium chloride     PRN Meds:.sodium chloride, sodium chloride, sodium chloride, sodium chloride, acetaminophen, bisacodyl, diphenhydrAMINE, guaiFENesin-dextromethorphan, hydrOXYzine, ibuprofen, lidocaine (PF), lidocaine-prilocaine, nitroGLYCERIN, ondansetron **OR** ondansetron (ZOFRAN) IV, ondansetron (ZOFRAN) IV, pentafluoroprop-tetrafluoroeth, sodium chloride flush, sodium chloride flush  Current Labs: reviewed    Physical Exam:  Blood pressure (!) 169/59, pulse 71, temperature 97.8 F (36.6 C), temperature source Oral, resp. rate 20, height 5\' 1"  (1.549 m), weight 59.4 kg, SpO2 100 %. Gen: elderly female, NAD, sitting in chair CV: s1s2, rrr Resp: clear Abd: soft, nt/nd Ext: no edema Dialysis access: LUE AVG +b/t    Recent Labs  Lab 07/17/21 0401 07/19/21 0355 07/21/21 0451 07/22/21 0415 07/23/21 0410  NA 137 136 134* 135 130*  K 4.5 5.2* 3.9 4.0 4.5  CL 100 94* 95* 98 91*  CO2 29 24 26 26 24   GLUCOSE 93 73 95 83 133*  BUN 25* 62* 52* 32* 52*  CREATININE 4.43* 8.94* 7.62* 5.57* 7.83*  CALCIUM 8.8*  9.1 9.4 9.5 9.6  PHOS 3.7 3.9 3.8  --   --    Recent Labs  Lab 07/21/21 0451 07/22/21 0415 07/23/21 0410  WBC 5.6 4.0 10.8*  HGB 7.8* 8.0* 9.5*  HCT 24.4* 25.7* 28.7*  MCV 113.5* 112.2* 108.7*  PLT 175 137* PLATELET CLUMPS NOTED ON SMEAR, UNABLE TO ESTIMATE     Dialysis Orders:  [facility] - MWF-  3.5 hours  EDW 54. HD Bath 2k/2.5 ca, Cellent 17H KOA, Heparin none. Access LUE AVG BFR 300, DFR 500- 15 guage. Micera 200 mcg IV every 2 weeks  A ESRD MWF, on schedule: 2K, 2-3L UF, no heparin, AVG, 3.5h, 300/500 BFR/DFR. HD today 07/23/21 NSTEMI, known severe CAD,CP, cath unchanged from 2020.  Medical therapy Anemia, had some decline inpaitent, rec'd 1u PRBC. Stool occult neg 1/10. 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

## 2021-07-23 NOTE — Evaluation (Signed)
Physical Therapy Evaluation Patient Details Name: Shawna [name] MRN: [id number] DOB: [birth year] Today's Date: 07/23/2021  History of Present Illness  81 y.o. female presents to [facility] hospital on 07/21/2021 as a transfer from [facility] for cardiac cath. Pt was admitted to [facility] with chest pain. PMH includes CAD, CABG 2013, ESRD, CHF, colon cancer, OA, GERD.  Clinical Impression  Pt presents to PT with deficits in activity tolerance, strength, power, balance, gait. Pt with mild lateral drift when ambulating as well as earlier onset of fatigue compared to baseline. Pt recommends use of 4 wheeled walker with seat initially at home to improve balance and to allow for energy conservation. Pt will benefit from frequent mobilization to aide in a return to her prior level of function.       Recommendations for follow up therapy are one component of a multi-disciplinary discharge planning process, led by the attending physician.  Recommendations may be updated based on patient status, additional functional criteria and insurance authorization.  Follow Up Recommendations No PT follow up    Assistance Recommended at Discharge Intermittent Supervision/Assistance  Patient can return home with the following  Help with stairs or ramp for entrance    Equipment Recommendations None recommended by PT (PT recommends use of pt's 4 wheeled walker)  Recommendations for Other Services       Functional Status Assessment Patient has had a recent decline in their functional status and demonstrates the ability to make significant improvements in function in a reasonable and predictable amount of time.     Precautions / Restrictions Precautions Precautions: Fall Restrictions Weight Bearing Restrictions: No      Mobility  Bed Mobility                    Transfers Overall transfer level: Needs assistance Equipment used: None Transfers: Sit to/from Stand Sit to Stand: Supervision                 Ambulation/Gait Ambulation/Gait assistance: Supervision Gait Distance (Feet): 200 Feet Assistive device: None Gait Pattern/deviations: Step-through pattern Gait velocity: functional Gait velocity interpretation: 1.31 - 2.62 ft/sec, indicative of limited community ambulator   General Gait Details: pt with steady step-through gait, mild lateral drift. Pt requires one standing rest break due to fatigue  Stairs Stairs:  (pt declines stair training)          Wheelchair Mobility    Modified Rankin (Stroke Patients Only)       Balance Overall balance assessment: Needs assistance Sitting-balance support: No upper extremity supported;Feet supported Sitting balance-Leahy Scale: Good     Standing balance support: No upper extremity supported;During functional activity Standing balance-Leahy Scale: Good                               Pertinent Vitals/Pain Pain Assessment: No/denies pain    Home Living Family/patient expects to be discharged to:: Private residence Living Arrangements: Spouse/significant other Available Help at Discharge: Family;Available 24 hours/day Type of Home: House Home Access: Stairs to enter Entrance Stairs-Rails: Right Entrance Stairs-Number of Steps: 2   Home Layout: One level Home Equipment: Rollator (4 wheels);Cane - single point      Prior Function Prior Level of Function : Needs assist       Physical Assist : Mobility (physical) Mobility (physical): Stairs   Mobility Comments: pt reports PRN use of SPC for ambulation at baseline. Spouse assists with stair negotiation  Hand Dominance   Dominant Hand: Right    Extremity/Trunk Assessment   Upper Extremity Assessment Upper Extremity Assessment: Overall WFL for tasks assessed    Lower Extremity Assessment Lower Extremity Assessment: Generalized weakness    Cervical / Trunk Assessment Cervical / Trunk Assessment: Kyphotic  Communication    Communication: No difficulties  Cognition Arousal/Alertness: Awake/alert Behavior During Therapy: WFL for tasks assessed/performed Overall Cognitive Status: Within Functional Limits for tasks assessed                                          General Comments General comments (skin integrity, edema, etc.): VSS on RA    Exercises     Assessment/Plan    PT Assessment Patient needs continued PT services  PT Problem List Decreased balance;Decreased activity tolerance;Decreased strength;Decreased mobility       PT Treatment Interventions DME instruction;Gait training;Stair training;Functional mobility training;Therapeutic activities;Therapeutic exercise;Balance training;Patient/family education    PT Goals (Current goals can be found in the Care Plan section)  Acute Rehab PT Goals Patient Stated Goal: to go home PT Goal Formulation: With patient Time For Goal Achievement: 08/06/21 Potential to Achieve Goals: Good Additional Goals Additional Goal #1: Pt will score >19/24 on the DGI to indicate a reduced risk for falls    Frequency Min 3X/week     Co-evaluation               AM-PAC PT "6 Clicks" Mobility  Outcome Measure Help needed turning from your back to your side while in a flat bed without using bedrails?: None Help needed moving from lying on your back to sitting on the side of a flat bed without using bedrails?: None Help needed moving to and from a bed to a chair (including a wheelchair)?: A Little Help needed standing up from a chair using your arms (e.g., wheelchair or bedside chair)?: A Little Help needed to walk in hospital room?: A Little Help needed climbing 3-5 steps with a railing? : A Little 6 Click Score: 20    End of Session   Activity Tolerance: Patient tolerated treatment well Patient left: in chair;with call bell/phone within reach;with chair alarm set Nurse Communication: Mobility status PT Visit Diagnosis: Other abnormalities  of gait and mobility (R26.89);Unsteadiness on feet (R26.81)    Time: 2956-2130 PT Time Calculation (min) (ACUTE ONLY): 14 min   Charges:   PT Evaluation $PT Eval Low Complexity: [place], PT, DPT Acute Rehabilitation Pager: [phone] Office [phone]   Zenaida Niece 07/23/2021, 5:31 PM

## 2021-07-23 NOTE — Progress Notes (Signed)
Pt receives out-pt HD at [facility] on MWF. Pt arrives at 10:30 for 10:45 chair time. Will assist as needed.   Melven Sartorius Renal Navigator [phone]

## 2021-07-23 NOTE — TOC Initial Note (Signed)
Transition of Care [facility]) - Initial/Assessment Note    Patient Details  Name: Shawna [name] MRN: [id number] Date of Birth: 05-Apr-1941  [facility] CM/SW Contact:    Bethena Roys, RN Phone Number: 07/23/2021, 4:29 PM  Clinical Narrative:  Risk for readmission assessment completed. Prior to arrival patient was from home with her spouse. Patient states her spouse takes her to all appointments and HD. Patient states she has a cane in the home when needed. Patient states she gets her medications without any issues. No home needs identified at this time. Case Manager will continue to follow for disposition needs.                Expected Discharge Plan: Home/Self Care Barriers to Discharge: Continued Medical Work up   Patient Goals and CMS Choice Patient states their goals for this hospitalization and ongoing recovery are:: plans to return home with husband.   Choice offered to / list presented to : NA  Expected Discharge Plan and Services Expected Discharge Plan: Home/Self Care   Discharge Planning Services: CM Consult                       DME Agency: NA       HH Arranged: NA          Prior Living Arrangements/Services   Lives with:: Self, Spouse Patient language and need for interpreter reviewed:: Yes Do you feel safe going back to the place where you live?: Yes      Need for Family Participation in Patient Care: Yes (Comment) Care giver support system in place?: Yes (comment) Current home services: DME (Patient has a cane at home) Criminal Activity/Legal Involvement Pertinent to Current Situation/Hospitalization: No - Comment as needed  Activities of Daily Living Home Assistive Devices/Equipment: Wheelchair, Environmental consultant (specify type), Cane (specify quad or straight) ADL Screening (condition at time of admission) Patient's cognitive ability adequate to safely complete daily activities?: Yes Is the patient deaf or have difficulty hearing?:  No Does the patient have difficulty seeing, even when wearing glasses/contacts?: No Does the patient have difficulty concentrating, remembering, or making decisions?: No Patient able to express need for assistance with ADLs?: Yes Does the patient have difficulty dressing or bathing?: No Independently performs ADLs?: No Communication: Independent Dressing (OT): Independent Grooming: Independent Feeding: Independent Bathing: Independent Toileting: Independent with device (comment) In/Out Bed: Independent with device (comment) Walks in Home: Independent with device (comment) Does the patient have difficulty walking or climbing stairs?: Yes Weakness of Legs: Both Weakness of Arms/Hands: None  Permission Sought/Granted Permission sought to share information with : Family Supports, Customer service manager, Case Optician, dispensing granted to share information with : Yes, Verbal Permission Granted              Emotional Assessment Appearance:: Appears stated age Attitude/Demeanor/Rapport: Engaged Affect (typically observed): Appropriate Orientation: : Oriented to Situation, Oriented to  Time, Oriented to Place, Oriented to Self Alcohol / Substance Use: Not Applicable Psych Involvement: No (comment)  Admission diagnosis:  Pulmonary edema [J81.1] Chest pain [R07.9] Acute respiratory failure, unspecified whether with hypoxia or hypercapnia (HCC) [J96.00] Chest pain, unspecified type [R07.9] Patient Active Problem List   Diagnosis Date Noted   Ischemic cardiomyopathy    Pulmonary edema 07/16/2021   Atrial fibrillation ([place]) 05/07/2021   Vitamin B deficiency 04/23/2021   Neuropathy 04/23/2021   Anemia due to chronic blood loss 04/13/2021   Left knee pain 03/15/2021   Moderate protein-calorie malnutrition (  [place]) 02/27/2021   Hypokalemia 02/27/2021   COVID-19 virus infection 02/03/2021   Leukocytosis 02/03/2021   Elevated MCV 02/03/2021   Hypoalbuminemia due to  protein-calorie malnutrition ([place]) 02/03/2021   Hypothyroidism (acquired) 02/03/2021   Myositis 12/03/2020   Ataxia 12/02/2020   Diabetes mellitus type 2 in nonobese ([place]) 11/10/2020   Acute pulmonary edema ([place]) 11/10/2020   Generalized weakness 11/09/2020   Dialysis AV fistula malfunction, initial encounter ([place])    Jugular vein occlusion, right ([place])    Failure of surgically constructed arteriovenous fistula ([place]) 10/03/2020   Myoclonus 08/31/2020   Clotted renal dialysis AV graft, initial encounter ([place])    Hypertensive heart and chronic kidney disease with heart failure and stage 1 through stage 4 chronic kidney disease, or chronic kidney disease ([place])    Hemodialysis-associated hypotension    Acute hypoxemic respiratory failure ([place]) 06/14/2020   Hypertension 06/12/2020   Irritable bowel syndrome 02/25/2020   Adenomatous duodenal polyp 09/10/2019   History of GI bleed 09/10/2019   Angina pectoris ([place]) 06/05/2019   Chest pain 06/03/2019   Small intestinal bacterial overgrowth 05/14/2019   Iron deficiency anemia 04/02/2019   Acute respiratory failure with hypoxia ([place]) 12/25/2018   Elevated troponin 12/14/2018   Chest pain at rest 07/13/2018   Hand steal syndrome ([place]) 08/01/2017   Anemia 07/14/2017   Coronary artery disease 06/05/2017   Mesenteric ischemia (HCC)    Diverticulitis    Chronic diarrhea    Complication of vascular access for dialysis 03/19/2017   Preoperative clearance 01/25/2017   H/O non-ST elevation myocardial infarction (NSTEMI) 10/24/2016   Fluid overload 83/41/9622   Complication from renal dialysis device 10/10/2016   Non-ST elevation (NSTEMI) myocardial infarction [facility])    Encounter for fitting and adjustment of vascular catheter    Heme positive stool    Demand ischemia ([place]) 07/27/2016   Hypertensive emergency 07/08/2016   Acute on chronic respiratory failure with hypoxia [facility])    Cardiac arrest [facility])    Palliative care encounter    Goals of  care, counseling/discussion    Hypertensive crisis without congestive heart failure 05/09/2016   Flash pulmonary edema ([name]) 04/06/2016   Acute respiratory failure ([place]) 04/06/2016   Hypertensive crisis 01/27/2016   History of colon cancer 01/27/2016   History of ovarian cancer 01/27/2016   Hypertensive urgency 01/27/2016   PAF (paroxysmal atrial fibrillation) ([place]) 10/14/2015   Coronary angioplasty status 10/14/2015   Malignant neoplasm of right ovary ([place]) 10/14/2015   Narrow complex tachycardia ([place]) 09/08/2015   SVT (supraventricular tachycardia) ([place]) 09/08/2015   Influenza A 08/30/2015   Acute on chronic diastolic CHF (congestive heart failure) ([place]) 05/04/2015   Unstable angina ([place]) 05/03/2015   DOE (dyspnea on exertion)    Essential hypertension    Hyponatremia 10/01/2014   Pain in joint, lower leg 08/14/2014   Dacryocystitis 05/29/2013   Chronic diastolic CHF (congestive heart failure) ([place]) 03/22/2013   Acute post-hemorrhagic anemia 03/21/2013   Occlusion and stenosis of carotid artery without mention of cerebral infarction 01/24/2013   Hx of CABG 07/05/2012   Carotid artery disease ([place]) 07/05/2012   Mitral regurgitation 06/12/2012   Pneumonia 06/09/2012   Non-STEMI (non-ST elevated myocardial infarction) ([place]) 06/08/2012   Ischemic chest pain ([place]) 03/01/2012   AVM (arteriovenous malformation) of small bowel, acquired 01/20/2012   Acute combined systolic and diastolic heart failure ([place]) 01/09/2012   GERD (gastroesophageal reflux disease) 01/09/2012   HLD (hyperlipidemia) 01/05/2012   Atherosclerotic heart disease of native coronary artery without angina pectoris  12/16/2011   Essential hypertension, benign 12/16/2011   ESRD on hemodialysis ([place]) 12/16/2011   Anxiety disorder 05/04/2011   Anemia in chronic kidney disease 04/29/2011   ESRD (end stage renal disease) on dialysis ([place]) 04/29/2011   Gout 04/29/2011   Hypertensive chronic kidney disease with stage 5  chronic kidney disease or end stage renal disease ([place]) 04/29/2011   PCP:  Practice, [place]:   [facility] [address], [place] [address] Phone: [phone] Fax: [phone]  [facility] (ESRD Bundle Only) - [place], [place] - [address] [place] [address] Phone: [phone] Fax: [phone]   Readmission Risk Interventions Readmission Risk Prevention Plan 07/23/2021 11/11/2020 10/04/2020  Transportation Screening Complete Complete Complete  PCP or Specialist Appt within 3-5 Days - - -  HRI or [place] for [place] - - -  Medication Review Press photographer) Complete Complete Complete  PCP or Specialist appointment within 3-5 days of discharge Complete Complete Complete  HRI or Home Care Consult Complete Complete Complete  SW Recovery Care/Counseling Consult Complete Complete Complete  Palliative Care Screening Not Applicable Not Applicable Not Applicable  Skilled Nursing Facility Not Applicable Not Applicable Complete  Some recent data might be hidden

## 2021-07-23 NOTE — Care Management Important Message (Signed)
Important Message  Patient Details  Name: Shawna [name] MRN: [id number] Date of Birth: 01/21/1941   Medicare Important Message Given:  Yes     Shelda Altes 07/23/2021, 10:41 AM

## 2021-07-23 NOTE — Progress Notes (Addendum)
Progress Note  Patient Name: Shawna [name] Date of Encounter: 07/23/2021  [facility] Cardiologist: Carlyle Dolly, MD   Subjective   No acute overnight events. Patient biggest complaint this morning is she is having significant itching on the bottom of her feet since cardiac cath yesterday. Chest pain as resolved. She states she had mild discomfort of left breast last night when she laid down but it resolved on its own and she has had no other pain/discomfort. Breathing at baseline.  Inpatient Medications    Scheduled Meds:  ALPRAZolam  0.5 mg Oral QHS   amiodarone  200 mg Oral Daily   aspirin EC  81 mg Oral Daily   darbepoetin (ARANESP) injection - DIALYSIS  150 mcg Intravenous Q Fri-HD   isosorbide mononitrate  90 mg Oral BID   levothyroxine  25 mcg Oral Daily   metoprolol tartrate  12.5 mg Oral BID   midodrine  5 mg Oral Q M,W,F-HD   multivitamin  1 tablet Oral Daily   rosuvastatin  10 mg Oral Daily   sevelamer carbonate  2,400 mg Oral TID WC   sodium chloride flush  3 mL Intravenous Q12H   Continuous Infusions:  sodium chloride     sodium chloride     sodium chloride     sodium chloride     PRN Meds: sodium chloride, sodium chloride, sodium chloride, sodium chloride, acetaminophen, bisacodyl, diphenhydrAMINE, guaiFENesin-dextromethorphan, hydrOXYzine, ibuprofen, lidocaine (PF), lidocaine-prilocaine, nitroGLYCERIN, ondansetron **OR** ondansetron (ZOFRAN) IV, ondansetron (ZOFRAN) IV, pentafluoroprop-tetrafluoroeth, sodium chloride flush, sodium chloride flush   Vital Signs    Vitals:   07/22/21 1526 07/22/21 1948 07/22/21 2101 07/23/21 0601  BP: (!) 157/60 (!) 134/52 111/64 (!) 156/58  Pulse: 67 80 71 82  Resp: 20 16  (!) 22  Temp:  98.1 F (36.7 C)  98.3 F (36.8 C)  TempSrc:  Oral  Oral  SpO2: 100% 99%  100%  Weight:      Height:       No intake or output data in the 24 hours ending 07/23/21 1122 Last 3 Weights 07/22/2021 07/21/2021 07/21/2021  Weight  (lbs) 130 lb 15.3 oz 122 lb 5.7 oz 128 lb 8.5 oz  Weight (kg) 59.4 kg 55.5 kg 58.3 kg      Telemetry    Normal sinus rhythm with rates in the 60s to 70s. - Personally Reviewed  ECG    No new ECG tracing today. - Personally Reviewed  Physical Exam   GEN: African-American female in no acute distress.   Neck: No JVD. Cardiac: RRR. No murmurs, rubs, or gallops.  Respiratory: Clear to auscultation bilaterally. No wheezes, rhonchi, or rales. GI: Soft, non-distended, and non-tender. MS: Trace edema of bilateral lower extremities. No deformity. Skin: Warm and dry. Neuro:  No focal deficits. Psych: Normal affect. Responds appropriately.  Labs    High Sensitivity Troponin:   Recent Labs  Lab 07/16/21 0933 07/16/21 1436 07/16/21 1901 07/16/21 2120 07/16/21 2315  TROPONINIHS 313* 449* 356* 279* 251*     Chemistry Recent Labs  Lab 07/19/21 0355 07/21/21 0451 07/22/21 0415 07/23/21 0410  NA 136 134* 135 130*  K 5.2* 3.9 4.0 4.5  CL 94* 95* 98 91*  CO2 24 26 26 24   GLUCOSE 73 95 83 133*  BUN 62* 52* 32* 52*  CREATININE 8.94* 7.62* 5.57* 7.83*  CALCIUM 9.1 9.4 9.5 9.6  MG  --   --  1.9 1.9  ALBUMIN 3.2* 3.0*  --   --  GFRNONAA 4* 5* 7* 5*  ANIONGAP 18* 13 11 15     Lipids No results for input(s): CHOL, TRIG, HDL, LABVLDL, LDLCALC, CHOLHDL in the last 168 hours.  Hematology Recent Labs  Lab 07/21/21 0451 07/22/21 0415 07/23/21 0410  WBC 5.6 4.0 10.8*  RBC 2.15* 2.29* 2.64*  HGB 7.8* 8.0* 9.5*  HCT 24.4* 25.7* 28.7*  MCV 113.5* 112.2* 108.7*  MCH 36.3* 34.9* 36.0*  MCHC 32.0 31.1 33.1  RDW 20.7* 20.3* 20.6*  PLT 175 137* PLATELET CLUMPS NOTED ON SMEAR, UNABLE TO ESTIMATE   Thyroid No results for input(s): TSH, FREET4 in the last 168 hours.  BNPNo results for input(s): BNP, PROBNP in the last 168 hours.  DDimer No results for input(s): DDIMER in the last 168 hours.   Radiology    CARDIAC CATHETERIZATION  Result Date: 07/22/2021 Images from the original  result were not included.   Mid LM to Dist LM lesion is 60% stenosed.   Prox LAD lesion is 90% stenosed.   Ost Cx to Mid Cx lesion is 99% stenosed.   Ost RCA to Prox RCA lesion is 100% stenosed.   Ost Ramus to Ramus lesion is 80% stenosed.   Ost 1st Diag to 1st Diag lesion is 100% stenosed.   Ost RPDA to RPDA lesion is 75% stenosed.   2nd Mrg lesion is 60% stenosed.   Dist LAD lesion is 70% stenosed.   LIMA and is large.   SVG and is large.   SVG and is normal in caliber.   SVG and is large.   The graft exhibits no disease.   The graft exhibits no disease.   The graft exhibits no disease.   LV end diastolic pressure is mildly elevated.   The left ventricular ejection fraction is 35-45% by visual estimate. Shawna [name] is a 81 y.o. female  [id number] LOCATION:  FACILITY: [facility] PHYSICIAN: Quay Burow, M.D. 1940-12-24 DATE OF PROCEDURE:  07/22/2021 DATE OF DISCHARGE: CARDIAC CATHETERIZATION History obtained from patient's chart.: 81 y.o. female w/ PMH of CAD (s/p CABG in 2013, DES to D1 in 10/2016, cath in 07/2018 showing patent grafts with occlusion of D1 at prior stent site and progression of PDA disease --> medical management recommended), HFpEF, HTN, HLD, paroxysmal atrial fibrillation (not on anticoagulation given history of GIB), NSVT, chronic anemia and ESRD who is currently admitted for evaluation of recurrent atrial flutter and chest pain.  Chest pain pressure I suspect to be with is a dialysis patient is a right probably dialyzed and her medications.  Her enzymes were elevated to a troponin of 400.  Her EF was reduced by 2D echo to the 35% range.  She was referred for diagnostic coronary angiography.   Ms. Hilgeman anatomy is for all intents and purposes unchanged from her prior cath performed by Dr. Martinique in 2020.  All her grafts are patent.  Her LVEDP was 18.  Her EF looks to be in the 40 to 45% range with an anteroapical wall motion abnormality.  I suspect that her chest pain and mild enzyme leak was  related to "demand ischemia".  Medical therapy will be recommended.  If femoral angiogram was performed and the stick appeared to be slightly high and therefore Mynx closure was not performed.  The patient will need a manual hold in the recovery bay prior to going to the floor.  Dr. Domenic Polite, the patient's attending cardiologist, was notified of these results. Quay Burow. MD, [facility] 07/22/2021 11:53 AM  Cardiac Studies   Echocardiogram 07/16/2021: Impressions:  1. Left ventricular ejection fraction, by estimation, is 30 to 35%. The  left ventricle has moderately decreased function. The left ventricle  demonstrates regional wall motion abnormalities (see scoring  diagram/findings for description). There is mild  left ventricular hypertrophy. Left ventricular diastolic parameters are  consistent with Grade II diastolic dysfunction (pseudonormalization).  Elevated left atrial pressure.   2. Right ventricular systolic function is moderately reduced. The right  ventricular size is normal. There is mildly elevated pulmonary artery  systolic pressure. The estimated right ventricular systolic pressure is  27.7 mmHg.   3. Left atrial size was severely dilated.   4. Right atrial size was mildly dilated.   5. The mitral valve is degenerative. Moderate mitral valve regurgitation.  Mild to moderate mitral stenosis. MG 85mmHg at 70bpm, MVA 1.2 cm^2 by  continuity equation. Moderate mitral annular calcification.   6. Tricuspid valve regurgitation is mild to moderate.   7. The aortic valve is tricuspid. Aortic valve regurgitation is not  visualized. Aortic valve sclerosis/calcification is present, without any  evidence of aortic stenosis.   8. The inferior vena cava is normal in size with greater than 50%  respiratory variability, suggesting right atrial pressure of 3 mmHg.  _______________  Left Cardiac Catheterization 07/22/2021:   Mid LM to Dist LM lesion is 60% stenosed.   Prox LAD lesion is 90%  stenosed.   Ost Cx to Mid Cx lesion is 99% stenosed.   Ost RCA to Prox RCA lesion is 100% stenosed.   Ost Ramus to Ramus lesion is 80% stenosed.   Ost 1st Diag to 1st Diag lesion is 100% stenosed.   Ost RPDA to RPDA lesion is 75% stenosed.   2nd Mrg lesion is 60% stenosed.   Dist LAD lesion is 70% stenosed.   LIMA and is large.   SVG and is large.   SVG and is normal in caliber.   SVG and is large.   The graft exhibits no disease.   The graft exhibits no disease.   The graft exhibits no disease.   LV end diastolic pressure is mildly elevated.   The left ventricular ejection fraction is 35-45% by visual estimate.  Impression: Ms. Fehrenbach anatomy is for all intents and purposes unchanged from her prior cath performed by Dr. Martinique in 2020.  All her grafts are patent.  Her LVEDP was 18.  Her EF looks to be in the 40 to 45% range with an anteroapical wall motion abnormality.  I suspect that her chest pain and mild enzyme leak was related to "demand ischemia".  Medical therapy will be recommended.  If femoral angiogram was performed and the stick appeared to be slightly high and therefore Mynx closure was not performed.  The patient will need a manual hold in the recovery bay prior to going to the floor.  Dr. Domenic Polite, the patient's attending cardiologist, was notified of these results.  Diagnostic Dominance: Right  Patient Profile     81 y.o. female with a history of CAD s/p CABG x4 in 2013 with subsequent DES to D1 in 10/2016, chronic combined CHF with EF of 50% on last Echo in 03/2021, paroxysmal atrial fibrillation not on anticoagulation given history of GI bleed, non-sustained, hypertension, hyperlipidemia, ESRD on hemodialysis, GERD and chronic anemia who was admitted at [facility] on 07/15/2021 for atrial flutter with RVR and chest pain. Found to have elevated troponin peaking at 449. Echo showed a drop in EF  to 30-35% and new wall motion abnormalities. Therefore, she was transferred  to [facility] for cardiac catheterization.  Assessment & Plan    Demand Ischemia History of CAD s/p CABG History of CAD as described above. High-sensitivity troponin peaked at 449. Echo showed LVEF of 30-35% with akinesis of the entire anterior wall, entire anterior septum, mid inferoseptal segment, and apex as well as hypokinesis of the apical lateral segment and mid anterolateral segment. Also showed mild LV with grade 2 diastolic dysfunction, moderately reduced RV systolic function, mildly elevated PASP, moderate MR, and mild to moderate TR. LHC on 1/12 showed patent grafts and no changes from prior cath in 2020. Chest pain and mild enzyme leak felt to be due to demand ischemia. Continue medical therapy recommended. - Continue Imdur 90mg  twice daily. - Currently on Lopressor 12.5mg  twice daily. Will transition to Toprol-XL 25mg  daily given drop in EF. - Continue aspirin and statin.  Chronic Combined CHF Echo showed drop in EF to 30-35% (down from 50% in 03/2021)  - Appears euvolemic on exam. Volume status managed via hemodialysis. - Currently on Lopressor 12.5mg  twice daily. Will transition to Toprol-Xl25mg  daily given drop in EF. - Continue Imdur 90mg  twice daily. - Consider adding Hydralazine 25mg  three times daily for additional BP control. - No ACEi/ARB/ARNI due to ESRD.  Paroxysmal Atrial Fibrillation/Flutter History of paroxysmal atrial fibrillation but was noted to have a brief episode of atrial flutter with RVR on admission. Maintaing sinus rhythm at this time with rates in the 60s to 70s. - Continue Toprol-XL as above. - Continue Amiodarone 200mg  daily. - Not on anticoagulation due to history of GI bleed.  Hypertension BP uncontrolled with systolic BP often in the 110R to 180s. However, looks like patient is also on Midodrine on dialysis days. - Continue medications for CHF as above.  Chronic Anemia Baseline hemoglobin around 8. Hemoglobin dropped to 6.5 on 07/17/2021 s/p 1  unit of PRBCs. Hemoglobin stable at 9.5. - Management per primary team.  ESRD On hemodialysis. - Management per Nephrology.   For questions or updates, please contact [facility] Please consult [email or web address] for contact info under        Signed, Darreld Mclean, PA-C  07/23/2021, 11:22 AM

## 2021-07-23 NOTE — Progress Notes (Signed)
PROGRESS NOTE    Shawna [name]  FOY:[id number] DOB: Aug 04, 1940 DOA: 07/15/2021 PCP: [facility]    Chief Complaint  Patient presents with   Chest Pain    Brief Narrative:    81 y.o. female with medical history significant for significant coronary artery disease, unstable angina, status post CABG in 2013, hypothyroidism, carotid artery disease, end-stage renal disease on hemodialysis, chronic diastolic heart failure, history of colon cancer, colonic AVM, osteoarthritis, GERD, multiple hospitalizations for chest pain recently discharged from [facility] on 07/08/2021 after being admitted for chest pain concerning for unstable angina.    It was also noted that she had a history of paroxysmal atrial fibrillation.  She had not been anticoagulated secondary to GI bleeding and had been treated with amiodarone and metoprolol.  She was admitted with rising troponins and started on IV heparin infusion by cardiology team.  IV heparin drip had to be discontinued after hemoglobin drop was noted.  She did receive 1 unit PRBC transfusion and is currently demonstrating stable hemoglobin levels.  No occult bleed noted.  She is in stable condition for transfer to [facility] for cardiac catheterization anticipated 1/12. She under went cath, and cardiology recommended medical management.   Assessment & Plan:   Principal Problem:   Ischemic chest pain ([place]) Active Problems:   Atherosclerotic heart disease of native coronary artery without angina pectoris   Essential hypertension, benign   ESRD on hemodialysis (HCC)   HLD (hyperlipidemia)   Acute combined systolic and diastolic heart failure (HCC)   GERD (gastroesophageal reflux disease)   AVM (arteriovenous malformation) of small bowel, acquired   Anemia in chronic kidney disease   Hx of CABG   Carotid artery disease (HCC)   Unstable angina (HCC)   Non-ST elevation (NSTEMI) myocardial infarction (HCC)   PAF (paroxysmal atrial fibrillation)  (HCC)   Chest pain   Ischemic cardiomyopathy   NSTEMI:  - was on IV heparin initially, but had to be discontinued due to drop in hemoglobin.  - HIT antibody is negative.  - s/p cath , cardiology recommended medical management.  - plan for discharge after HD possible today or tomorrow.    ESRD ON HD;  Nephrology on board for HD today.    Mild acute on chronic combined systolic and diastolic CHF.  Volume management with HD.    PAF Rate controlled, resume amiodarone and metoprolol.    Anemia of chronic disease from ESRD.  S/p 1 unit of prbc transfusion.  Transfuse to keep hemoglobin greater than 7.    Mild thrombocytopenia:  - HIT panel is negative.  Continue to monitor.    Hypertension:  Well controlled.     DVT prophylaxis: SCD'S Code Status: Full code.  Family Communication: none at bedside.  Disposition:   Status is: Inpatient  Remains inpatient appropriate because: HD, cath       Consultants:  Cardiology Nephrology.  Procedures: Cardiac Cath  Antimicrobials: none.    Subjective: No chest pain or sob.   Objective: Vitals:   07/23/21 0601 07/23/21 1311 07/23/21 1737 07/23/21 1746  BP: (!) 156/58 (!) 169/59 (!) 143/55   Pulse: 82 71 69   Resp: (!) 22 20 (!) 21   Temp: 98.3 F (36.8 C) 97.8 F (36.6 C) 97.8 F (36.6 C) 97.8 F (36.6 C)  TempSrc: Oral Oral    SpO2: 100%  100%   Weight:   59.4 kg   Height:       No intake or output  data in the 24 hours ending 07/23/21 1754 Filed Weights   07/21/21 1505 07/22/21 1505 07/23/21 1737  Weight: 55.5 kg 59.4 kg 59.4 kg    Examination:  General exam: Appears calm and comfortable  Respiratory system: Clear to auscultation. Respiratory effort normal. Cardiovascular system: S1 & S2 heard, RRR. No JVD, No pedal edema. Gastrointestinal system: Abdomen is nondistended, soft and nontender. . Normal bowel sounds heard. Central nervous system: Alert and oriented. No focal neurological  deficits. Extremities: Symmetric 5 x 5 power. Skin: No rashes, lesions or ulcers Psychiatry: Mood & affect appropriate.     Data Reviewed: I have personally reviewed following labs and imaging studies  CBC: Recent Labs  Lab 07/19/21 0616 07/20/21 0915 07/21/21 0451 07/22/21 0415 07/23/21 0410  WBC 6.6 5.5 5.6 4.0 10.8*  HGB 8.1* 8.3* 7.8* 8.0* 9.5*  HCT 25.6* 27.1* 24.4* 25.7* 28.7*  MCV 115.3* 113.9* 113.5* 112.2* 108.7*  PLT 163 165 175 137* PLATELET CLUMPS NOTED ON SMEAR, UNABLE TO ESTIMATE    Basic Metabolic Panel: Recent Labs  Lab 07/17/21 0401 07/19/21 0355 07/21/21 0451 07/22/21 0415 07/23/21 0410  NA 137 136 134* 135 130*  K 4.5 5.2* 3.9 4.0 4.5  CL 100 94* 95* 98 91*  CO2 29 24 26 26 24   GLUCOSE 93 73 95 83 133*  BUN 25* 62* 52* 32* 52*  CREATININE 4.43* 8.94* 7.62* 5.57* 7.83*  CALCIUM 8.8* 9.1 9.4 9.5 9.6  MG  --   --   --  1.9 1.9  PHOS 3.7 3.9 3.8  --   --     GFR: Estimated Creatinine Clearance: 4.7 mL/min (A) (by C-G formula based on SCr of 7.83 mg/dL (H)).  Liver Function Tests: Recent Labs  Lab 07/19/21 0355 07/21/21 0451  ALBUMIN 3.2* 3.0*    CBG: Recent Labs  Lab 07/19/21 2041  GLUCAP 113*     Recent Results (from the past 240 hour(s))  Resp Panel by RT-PCR (Flu A&B, Covid) Nasopharyngeal Swab     Status: None   Collection Time: 07/16/21  4:11 AM   Specimen: Nasopharyngeal Swab; Nasopharyngeal(NP) swabs in vial transport medium  Result Value Ref Range Status   SARS Coronavirus 2 by RT PCR NEGATIVE NEGATIVE Final    Comment: (NOTE) SARS-CoV-2 target nucleic acids are NOT DETECTED.  The SARS-CoV-2 RNA is generally detectable in upper respiratory specimens during the acute phase of infection. The lowest concentration of SARS-CoV-2 viral copies this assay can detect is 138 copies/mL. A negative result does not preclude SARS-Cov-2 infection and should not be used as the sole basis for treatment or other patient management  decisions. A negative result may occur with  improper specimen collection/handling, submission of specimen other than nasopharyngeal swab, presence of viral mutation(s) within the areas targeted by this assay, and inadequate number of viral copies(<138 copies/mL). A negative result must be combined with clinical observations, patient history, and epidemiological information. The expected result is Negative.  Fact Sheet for Patients:  EntrepreneurPulse.com.au  Fact Sheet for Healthcare Providers:  IncredibleEmployment.be  This test is no t yet approved or cleared by the [place] FDA and  has been authorized for detection and/or diagnosis of SARS-CoV-2 by FDA under an Emergency Use Authorization (EUA). This EUA will remain  in effect (meaning this test can be used) for the duration of the COVID-19 declaration under Section 564(b)(1) of the Act, 21 U.S.C.section 360bbb-3(b)(1), unless the authorization is terminated  or revoked sooner.       Influenza  A by PCR NEGATIVE NEGATIVE Final   Influenza B by PCR NEGATIVE NEGATIVE Final    Comment: (NOTE) The Xpert Xpress SARS-CoV-2/FLU/RSV plus assay is intended as an aid in the diagnosis of influenza from Nasopharyngeal swab specimens and should not be used as a sole basis for treatment. Nasal washings and aspirates are unacceptable for Xpert Xpress SARS-CoV-2/FLU/RSV testing.  Fact Sheet for Patients: EntrepreneurPulse.com.au  Fact Sheet for Healthcare Providers: IncredibleEmployment.be  This test is not yet approved or cleared by the [place] FDA and has been authorized for detection and/or diagnosis of SARS-CoV-2 by FDA under an Emergency Use Authorization (EUA). This EUA will remain in effect (meaning this test can be used) for the duration of the COVID-19 declaration under Section 564(b)(1) of the Act, 21 U.S.C. section 360bbb-3(b)(1), unless the  authorization is terminated or revoked.  Performed at [facility], [address] [place], [place] [address]   Culture, blood (Routine X 2) w Reflex to ID Panel     Status: None   Collection Time: 07/16/21  6:31 AM   Specimen: BLOOD RIGHT HAND  Result Value Ref Range Status   Specimen Description BLOOD RIGHT HAND  Final   Special Requests   Final    BOTTLES DRAWN AEROBIC ONLY Blood Culture results may not be optimal due to an inadequate volume of blood received in culture bottles   Culture   Final    NO GROWTH 5 DAYS Performed at [facility], [address], [place], [place] [address]    Report Status 07/21/2021 FINAL  Final  Culture, blood (Routine X 2) w Reflex to ID Panel     Status: None   Collection Time: 07/16/21  6:31 AM   Specimen: Right Antecubital; Blood  Result Value Ref Range Status   Specimen Description RIGHT ANTECUBITAL  Final   Special Requests   Final    BOTTLES DRAWN AEROBIC ONLY Blood Culture adequate volume   Culture   Final    NO GROWTH 5 DAYS Performed at [facility], [address], [place], [place] [address]    Report Status 07/21/2021 FINAL  Final  Culture, blood (routine x 2)     Status: None   Collection Time: 07/16/21  5:40 PM   Specimen: Porta Cath; Blood  Result Value Ref Range Status   Specimen Description PORTA CATH ARTERIAL AVF  Final   Special Requests   Final    BOTTLES DRAWN AEROBIC AND ANAEROBIC Blood Culture adequate volume   Culture   Final    NO GROWTH 5 DAYS Performed at [facility], [address], [place], [place] [address]    Report Status 07/21/2021 FINAL  Final  Culture, blood (routine x 2)     Status: None   Collection Time: 07/16/21  5:55 PM   Specimen: Porta Cath; Blood  Result Value Ref Range Status   Specimen Description PORTA CATH VENOUS AVF  Final   Special Requests   Final    BOTTLES DRAWN AEROBIC AND ANAEROBIC Blood Culture adequate volume   Culture   Final    NO GROWTH 5 DAYS Performed at [facility],  [address], [place], [place] [address]    Report Status 07/21/2021 FINAL  Final  MRSA Next Gen by PCR, Nasal     Status: None   Collection Time: 07/16/21  7:14 PM   Specimen: Nasal Mucosa; Nasal Swab  Result Value Ref Range Status   MRSA by PCR Next Gen NOT DETECTED NOT DETECTED Final  Comment: (NOTE) The GeneXpert MRSA Assay (FDA approved for NASAL specimens only), is one component of a comprehensive MRSA colonization surveillance program. It is not intended to diagnose MRSA infection nor to guide or monitor treatment for MRSA infections. Test performance is not FDA approved in patients less than 58 years old. Performed at [facility], [address] [place], [place] [address]          Radiology Studies: CARDIAC CATHETERIZATION  Result Date: 07/22/2021 Images from the original result were not included.   Mid LM to Dist LM lesion is 60% stenosed.   Prox LAD lesion is 90% stenosed.   Ost Cx to Mid Cx lesion is 99% stenosed.   Ost RCA to Prox RCA lesion is 100% stenosed.   Ost Ramus to Ramus lesion is 80% stenosed.   Ost 1st Diag to 1st Diag lesion is 100% stenosed.   Ost RPDA to RPDA lesion is 75% stenosed.   2nd Mrg lesion is 60% stenosed.   Dist LAD lesion is 70% stenosed.   LIMA and is large.   SVG and is large.   SVG and is normal in caliber.   SVG and is large.   The graft exhibits no disease.   The graft exhibits no disease.   The graft exhibits no disease.   LV end diastolic pressure is mildly elevated.   The left ventricular ejection fraction is 35-45% by visual estimate. MAYRE BURY is a 81 y.o. female  [id number] LOCATION:  FACILITY: [facility] PHYSICIAN: Quay Burow, M.D. [birth year] DATE OF PROCEDURE:  07/22/2021 DATE OF DISCHARGE: CARDIAC CATHETERIZATION History obtained from patient's chart.: 81 y.o. female w/ PMH of CAD (s/p CABG in 2013, DES to D1 in 10/2016, cath in 07/2018 showing patent grafts with occlusion of D1 at prior stent site and progression of PDA disease --> medical  management recommended), HFpEF, HTN, HLD, paroxysmal atrial fibrillation (not on anticoagulation given history of GIB), NSVT, chronic anemia and ESRD who is currently admitted for evaluation of recurrent atrial flutter and chest pain.  Chest pain pressure I suspect to be with is a dialysis patient is a right probably dialyzed and her medications.  Her enzymes were elevated to a troponin of 400.  Her EF was reduced by 2D echo to the 35% range.  She was referred for diagnostic coronary angiography.   Ms. Hoeger anatomy is for all intents and purposes unchanged from her prior cath performed by Dr. Martinique in 2020.  All her grafts are patent.  Her LVEDP was 18.  Her EF looks to be in the 40 to 45% range with an anteroapical wall motion abnormality.  I suspect that her chest pain and mild enzyme leak was related to "demand ischemia".  Medical therapy will be recommended.  If femoral angiogram was performed and the stick appeared to be slightly high and therefore Mynx closure was not performed.  The patient will need a manual hold in the recovery bay prior to going to the floor.  Dr. Domenic Polite, the patient's attending cardiologist, was notified of these results. Quay Burow. MD, [facility] 07/22/2021 11:53 AM         Scheduled Meds:  ALPRAZolam  0.5 mg Oral QHS   amiodarone  200 mg Oral Daily   aspirin EC  81 mg Oral Daily   darbepoetin (ARANESP) injection - DIALYSIS  150 mcg Intravenous Q Fri-HD   isosorbide mononitrate  90 mg Oral BID   levothyroxine  25 mcg Oral Daily   metoprolol tartrate  12.5 mg Oral BID   midodrine  5 mg Oral Q M,W,F-HD   multivitamin  1 tablet Oral Daily   rosuvastatin  10 mg Oral Daily   sevelamer carbonate  2,400 mg Oral TID WC   sodium chloride flush  3 mL Intravenous Q12H   Continuous Infusions:  sodium chloride     sodium chloride     sodium chloride     sodium chloride       LOS: 7 days       Hosie Poisson, MD [facility]   To contact the attending  provider between 7A-7P or the covering provider during after hours 7P-7A, please log into the web site [email or web address] and access using universal [place] password for that web site. If you do not have the password, please call the hospital operator.  07/23/2021, 5:54 PM

## 2021-07-24 MED ORDER — ISOSORBIDE MONONITRATE ER 30 MG PO TB24: 90.0000 mg | ORAL_TABLET | Freq: Two times a day (BID) | ORAL | 3 refills | Status: DC

## 2021-07-24 NOTE — Evaluation (Signed)
Occupational Therapy Evaluation Patient Details Name: Shawna [name] MRN: [id number] DOB: 1941-01-31 Today's Date: 07/24/2021   History of Present Illness 81 y.o. female presents to [facility] hospital on 07/21/2021 as a transfer from [facility] for cardiac cath. Pt was admitted to [facility] with chest pain. PMH includes CAD, CABG 2013, ESRD, CHF, colon cancer, OA, GERD.   Clinical Impression   Pt very pleasant and motivated for participation with session, noted in chart that pt with soft BP but cleared with RN. Pt asymptomatic throughout evaluation. At baseline pt resides with husband, is mod indep with basic ADLs, uses SPC or Rollator for mobility but expressing multiple times preference is to not rely on DME. No recent falls no note. Pt requiring only Sup for bed mobility and transition to and from standing, and observed this date with increased confidence with static/dynamic standing tasks with unilateral UE support on stable surface. Requiring no assist for ADLs at this time in sitting/standing with only SBA provided for safety from OT. Pt at this time may benefit from follow up [place] to maximize indep and safety in home, but is safe for d/c to home with husbands S/A for IADLs/ADLs as needed. OT will sign off a this time.     Recommendations for follow up therapy are one component of a multi-disciplinary discharge planning process, led by the attending physician.  Recommendations may be updated based on patient status, additional functional criteria and insurance authorization.   Follow Up Recommendations  Home health OT    Assistance Recommended at Discharge Intermittent Supervision/Assistance  Patient can return home with the following A little help with bathing/dressing/bathroom;Assist for transportation;Assistance with cooking/housework    Functional Status Assessment  Patient has had a recent decline in their functional status and demonstrates the ability to make significant  improvements in function in a reasonable and predictable amount of time.  Equipment Recommendations  None recommended by OT    Recommendations for Other Services       Precautions / Restrictions Precautions Precautions: Fall Precaution Comments: soft BP dystolic>systolic Restrictions Weight Bearing Restrictions: No      Mobility Bed Mobility Overal bed mobility: Modified Independent             General bed mobility comments: HOB slightly elevated but good sequencing    Transfers Overall transfer level: Needs assistance Equipment used: None Transfers: Sit to/from Stand Sit to Stand: Supervision           General transfer comment: cues for hand placment for increased ease/effort      Balance Overall balance assessment: Needs assistance         Standing balance support: Single extremity supported                               ADL either performed or assessed with clinical judgement   ADL Overall ADL's : Modified independent                                       General ADL Comments: demo's ability to complete LB/UB dressing seated with mod indep, standing with close SBA with use of UE support on stable surface. no active LOB.     Vision Baseline Vision/History: 1 Wears glasses Ability to See in Adequate Light: 0 Adequate Patient Visual Report: No change from baseline  Perception     Praxis      Pertinent Vitals/Pain Pain Assessment: No/denies pain     Hand Dominance Right   Extremity/Trunk Assessment Upper Extremity Assessment Upper Extremity Assessment: Overall WFL for tasks assessed   Lower Extremity Assessment Lower Extremity Assessment: Generalized weakness   Cervical / Trunk Assessment Cervical / Trunk Assessment: Kyphotic   Communication Communication Communication: No difficulties   Cognition Arousal/Alertness: Awake/alert Behavior During Therapy: WFL for tasks assessed/performed Overall  Cognitive Status: Within Functional Limits for tasks assessed                                 General Comments: appears to be at overall cognitive baseline     General Comments  BP sustaining soft at 114/38- noted in chart that baseline BP was running soft throughout stay. nursing aware    Exercises     Shoulder Instructions      Home Living Family/patient expects to be discharged to:: Private residence Living Arrangements: Spouse/significant other Available Help at Discharge: Family;Available 24 hours/day Type of Home: House Home Access: Stairs to enter [employer] of Steps: 2 Entrance Stairs-Rails: Right Home Layout: One level     Bathroom Shower/Tub: Teacher, early years/pre: Standard Bathroom Accessibility: Yes   Home Equipment: Rollator (4 wheels);Cane - single point          Prior Functioning/Environment Prior Level of Function : Needs assist       Physical Assist : Mobility (physical) Mobility (physical): Stairs   Mobility Comments: pt reports tednency is to use SPC more than RW but she tries to not if she can help it          OT Problem List:        OT Treatment/Interventions:      OT Goals(Current goals can be found in the care plan section) Acute Rehab OT Goals Patient Stated Goal: To go home today OT Goal Formulation: With patient  OT Frequency:      Co-evaluation              AM-PAC OT "6 Clicks" Daily Activity     Outcome Measure Help from another person eating meals?: None Help from another person taking care of personal grooming?: None Help from another person toileting, which includes using toliet, bedpan, or urinal?: None Help from another person bathing (including washing, rinsing, drying)?: A Little Help from another person to put on and taking off regular upper body clothing?: None Help from another person to put on and taking off regular lower body clothing?: None 6 Click Score: 23   End  of Session Nurse Communication: Mobility status  Activity Tolerance: Patient tolerated treatment well Patient left: in bed;with call bell/phone within reach;with bed alarm set                   Time: 8177-1165 OT Time Calculation (min): 21 min Charges:  OT General Charges $OT Visit: 1 Visit OT Evaluation $OT Eval Low Complexity: 1 Low  Ofelia Podolski OTR/L acute rehab services Office: [phone]  Toula Moos Indigo Chaddock 07/24/2021, 12:03 PM

## 2021-07-24 NOTE — Discharge Summary (Signed)
Physician Discharge Summary  NAILAH LUEPKE LAG:[id number] DOB: 1940-10-11 DOA: 07/15/2021  PCP: [facility]  Admit date: 07/15/2021 Discharge date: 07/24/2021  Admitted From: Home.  Disposition:  Home.   Recommendations for Outpatient Follow-up:  Follow up with PCP in 1-2 weeks Please obtain BMP/CBC in one week Please follow up with cardiology and nephrology as scheduled.   Discharge Condition:stable.  CODE STATUS:full code.  Diet recommendation: Heart Healthy   Brief/Interim Summary:   81 y.o. female with medical history significant for significant coronary artery disease, unstable angina, status post CABG in 2013, hypothyroidism, carotid artery disease, end-stage renal disease on hemodialysis, chronic diastolic heart failure, history of colon cancer, colonic AVM, osteoarthritis, GERD, multiple hospitalizations for chest pain recently discharged from [facility] on 07/08/2021 after being admitted for chest pain concerning for unstable angina.    It was also noted that she had a history of paroxysmal atrial fibrillation.  She had not been anticoagulated secondary to GI bleeding and had been treated with amiodarone and metoprolol.  She was admitted with rising troponins and started on IV heparin infusion by cardiology team.  IV heparin drip had to be discontinued after hemoglobin drop was noted.  She did receive 1 unit PRBC transfusion and is currently demonstrating stable hemoglobin levels.  No occult bleed noted.  She is in stable condition for transfer to [facility] for cardiac catheterization anticipated 1/12. She under went cath, and cardiology recommended medical management.  Discharge Diagnoses:  Principal Problem:   Ischemic chest pain ([place]) Active Problems:   Atherosclerotic heart disease of native coronary artery without angina pectoris   Essential hypertension, benign   ESRD on hemodialysis (HCC)   HLD (hyperlipidemia)   Acute combined systolic and diastolic heart  failure (HCC)   GERD (gastroesophageal reflux disease)   AVM (arteriovenous malformation) of small bowel, acquired   Anemia in chronic kidney disease   Hx of CABG   Carotid artery disease (HCC)   Unstable angina (HCC)   Non-ST elevation (NSTEMI) myocardial infarction (HCC)   PAF (paroxysmal atrial fibrillation) (HCC)   Chest pain   Ischemic cardiomyopathy  NSTEMI:  - was on IV heparin initially, but had to be discontinued due to drop in hemoglobin.  - HIT antibody is negative.  - s/p cath , cardiology recommended medical management.       ESRD ON HD;  Nephrology on board     Mild acute on chronic combined systolic and diastolic CHF.  Volume management with HD.      PAF Rate controlled, resume amiodarone and metoprolol.      Anemia of chronic disease from ESRD.  S/p 1 unit of prbc transfusion.  Transfuse to keep hemoglobin greater than 7.      Mild thrombocytopenia:  - HIT panel is negative.  Continue to monitor.      Hypertension:  Well controlled.     Discharge Instructions  Discharge Instructions     Diet - low sodium heart healthy   Complete by: As directed    Increase activity slowly   Complete by: As directed       Allergies as of 07/24/2021       Reactions   Amlodipine Swelling   Aspirin Other (See Comments)   High Doses Mess up her stomach; "makes my bowels have blood in them". Takes 81 mg EC Aspirin    Nitrofurantoin Hives   Penicillins Other (See Comments)   SYNCOPE? , "makes me real weak when I take it; like  I'll pass out" Has patient had a PCN reaction causing immediate rash, facial/tongue/throat swelling, SOB or lightheadedness with hypotension: Yes Has patient had a PCN reaction causing severe rash involving mucus membranes or skin necrosis: no Has patient had a PCN reaction that required hospitalization no Has patient had a PCN reaction occurring within the last 10 years: no If all of the above   Bactrim  [sulfamethoxazole-trimethoprim] Rash   Contrast Media [iodinated Contrast Media] Itching   Iron Itching, Other (See Comments)   "they gave me iron in dialysis; had to give me Benadryl cause I had to have the iron" (05/02/2012)   Tylenol [acetaminophen] Itching, Other (See Comments)   Makes her feet on fire per pt   Gabapentin Other (See Comments)   Unknown reaction   Iron Sucrose    Ranexa [ranolazine] Other (See Comments)   Myoclonus-hospitalized    Sucroferric Oxyhydroxide    Dexilant [dexlansoprazole] Other (See Comments)   Upset stomach   Hydralazine Itching   Has tolerated while inpatient Has tolerated while inpatient   Levaquin [levofloxacin In D5w] Rash   Morphine And Related Itching, Other (See Comments)   Itching in feet   Pantoprazole Rash   Plavix [clopidogrel Bisulfate] Rash   Protonix [pantoprazole Sodium] Rash   Venofer [ferric Oxide] Itching, Other (See Comments)   Patient reports using Benadryl prior to doses as [place]        Medication List     STOP taking these medications    fluticasone 50 MCG/ACT nasal spray Commonly known as: FLONASE   Na Sulfate-K Sulfate-Mg Sulf 17.5-3.13-1.6 GM/177ML Soln Commonly known as: Suprep Bowel Prep Kit       TAKE these medications    ALPRAZolam 0.5 MG tablet Commonly known as: XANAX Take 0.5 mg by mouth at bedtime.   amiodarone 200 MG tablet Commonly known as: PACERONE Take 1 tablet (200 mg total) by mouth daily.   aspirin EC 81 MG tablet Take 81 mg by mouth daily.   hydrOXYzine 100 MG capsule Commonly known as: VISTARIL Take 100 mg by mouth daily as needed for anxiety.   isosorbide mononitrate 30 MG 24 hr tablet Commonly known as: IMDUR Take 3 tablets (90 mg total) by mouth 2 (two) times daily. What changed:  how much to take when to take this   levothyroxine 25 MCG tablet Commonly known as: SYNTHROID Take 1 tablet (25 mcg total) by mouth daily.   lidocaine-prilocaine cream Commonly  known as: EMLA Apply 1 application topically every Monday, Wednesday, and Friday. Prior to dialysis   loperamide 2 MG tablet Commonly known as: IMODIUM A-D Take 2 tablets (4 mg total) by mouth daily as needed for diarrhea or loose stools.   metoprolol tartrate 25 MG tablet Commonly known as: LOPRESSOR Take 0.5 tablets (12.5 mg total) by mouth 2 (two) times daily.   midodrine 5 MG tablet Commonly known as: PROAMATINE Take Monday, Wednesday, Friday morning BEFORE dialysis   multivitamin Tabs tablet Take 1 tablet by mouth daily.   nitroGLYCERIN 0.4 MG SL tablet Commonly known as: NITROSTAT Place 1 tablet (0.4 mg total) under the tongue every 5 (five) minutes x 3 doses as needed for chest pain (if no relief after 2nd dose, proceed to the ED for an evaluation or call 911).   omeprazole 20 MG capsule Commonly known as: PRILOSEC Take 1 capsule (20 mg total) by mouth daily.   rosuvastatin 10 MG tablet Commonly known as: Crestor Take 1 tablet (10 mg total) by  mouth daily.   sevelamer carbonate 800 MG tablet Commonly known as: RENVELA Take 3 tablets (2,400 mg total) by mouth 3 (three) times daily with meals.        Follow-up Information     [facility]. Schedule an appointment as soon as possible for a visit in 1 week(s).   Contact information: [place] [address] [phone]         Arnoldo Lenis, MD .   Specialty: Cardiology Contact information: [place] [address] [phone]                Allergies  Allergen Reactions   Amlodipine Swelling   Aspirin Other (See Comments)    High Doses Mess up her stomach; "makes my bowels have blood in them". Takes 81 mg EC Aspirin    Nitrofurantoin Hives   Penicillins Other (See Comments)    SYNCOPE? , "makes me real weak when I take it; like I'll pass out"  Has patient had a PCN reaction causing immediate rash, facial/tongue/throat swelling, SOB or lightheadedness  with hypotension: Yes Has patient had a PCN reaction causing severe rash involving mucus membranes or skin necrosis: no Has patient had a PCN reaction that required hospitalization no Has patient had a PCN reaction occurring within the last 10 years: no If all of the above    Bactrim [Sulfamethoxazole-Trimethoprim] Rash   Contrast Media [Iodinated Contrast Media] Itching   Iron Itching and Other (See Comments)    "they gave me iron in dialysis; had to give me Benadryl cause I had to have the iron" (05/02/2012)   Tylenol [Acetaminophen] Itching and Other (See Comments)    Makes her feet on fire per pt   Gabapentin Other (See Comments)    Unknown reaction   Iron Sucrose    Ranexa [Ranolazine] Other (See Comments)    Myoclonus-hospitalized    Sucroferric Oxyhydroxide    Dexilant [Dexlansoprazole] Other (See Comments)    Upset stomach   Hydralazine Itching    Has tolerated while inpatient Has tolerated while inpatient   Levaquin [Levofloxacin In D5w] Rash   Morphine And Related Itching and Other (See Comments)    Itching in feet   Pantoprazole Rash   Plavix [Clopidogrel Bisulfate] Rash   Protonix [Pantoprazole Sodium] Rash   Venofer [Ferric Oxide] Itching and Other (See Comments)    Patient reports using Benadryl prior to doses as [place]    Consultations: Nephrology Cardiology.    Procedures/Studies: DG Chest 2 View  Result Date: 07/15/2021 CLINICAL DATA:  Central chest pain. EXAM: CHEST - 2 VIEW COMPARISON:  July 06, 2021 FINDINGS: Multiple sternal wires and vascular clips are seen. Mild, stable, diffusely increased lung markings are noted. Mild atelectasis is seen within the bilateral lung bases. Small bilateral pleural effusions are present. No pneumothorax is identified. There is mild to moderate severity enlargement of the cardiac silhouette. This is increased in severity when compared to the prior study. A coronary artery stent is noted. A radiopaque vascular  stent is seen within the soft tissues of the proximal left upper extremity. Multilevel degenerative changes seen throughout the thoracic spine. IMPRESSION: 1. Evidence of prior median sternotomy/CABG. 2. Mild to moderate severity enlargement of the cardiac silhouette which represents a new finding when compared to the exam. Interval development of a pericardial effusion cannot be excluded. Correlation with chest CT is recommended. 3. Mild bibasilar atelectasis. 4. Small bilateral pleural effusions. Electronically Signed  By: Virgina Norfolk M.D.   On: 07/15/2021 23:33   DG Chest 2 View  Result Date: 07/06/2021 CLINICAL DATA:  Pt states she has been having intermittent chest pain, but today is worse. States pain is in back as well. Notes from triage: Pt reports a few weeks of intermittent chest pain, relieved with nitro. EXAM: CHEST - 2 VIEW COMPARISON:  06/17/2021 FINDINGS: Linear scarring or subsegmental atelectasis in the lung bases and right mid lung. Heart size upper limits normal. CABG markers and coronary stents. Aortic Atherosclerosis (ICD10-170.0). No effusion.  No pneumothorax. Sternotomy wires. IMPRESSION: No acute disease post CABG. Electronically Signed   By: Lucrezia Europe M.D.   On: 07/06/2021 18:30   CT Chest Wo Contrast  Result Date: 07/16/2021 CLINICAL DATA:  81 year old female with chest pain. Pleural effusion. EXAM: CT CHEST WITHOUT CONTRAST TECHNIQUE: Multidetector CT imaging of the chest was performed following the standard protocol without IV contrast. COMPARISON:  Chest radiographs 07/15/2021. [facility] Noncontrast CT 02/26/2018. CT Abdomen and Pelvis 04/06/2020. FINDINGS: Cardiovascular: Prior CABG. Extensive calcified coronary artery and Calcified aortic atherosclerosis. Mild cardiomegaly. No pericardial effusion. Vascular patency is not evaluated in the absence of IV contrast. Mediastinum/Nodes: Stable. Upper limits of normal to mildly reactive appearing mediastinal  lymph nodes have not significantly changed since 2019. Lungs/Pleura: Small layering pleural effusions, trace on the left. Major airways are patent. However, there is pronounced bilateral lower lobe and right middle lobe bronchial wall thickening. Somewhat generalized increased pulmonary septal thickening also, and occasional areas of vague peribronchial ground-glass opacity (left lung series 4, image 42 right lung image 54). Superimposed chronic lung scarring. Confluent opacity in the costophrenic angles indeterminate for atelectasis versus infection. Upper Abdomen: Severe Calcified aortic atherosclerosis. Bilateral renal atrophy. Diverticulosis of the visible large bowel. Negative visible noncontrast liver, spleen, pancreas and stomach. Musculoskeletal: Prior sternotomy. Exaggerated thoracic kyphosis. Widespread advanced thoracic disc degeneration. Mild L1 superior endplate compression is new since 2019 but was probably acute last year. No acute osseous abnormality identified. IMPRESSION: 1. Severe Aortic Atherosclerosis (ICD10-I70.0) with prior CABG. Mild cardiomegaly. 2. Small layering pleural effusions (trace on the left) with generalized pulmonary septal thickening and vague asymmetric pulmonary ground-glass opacity new from 2019. Suspect pulmonary interstitial edema, however, there is also new bilateral lower lobe and right middle lobe bronchial wall thickening with confluent lung opacity in both costophrenic angles - suspicious for superimposed acute infection. Alternatively, the entire constellation might reflect a viral respiratory illness. 3. Mild L1 superior endplate compression fracture is new since 2019 but was probably chronic. Electronically Signed   By: Genevie Ann M.D.   On: 07/16/2021 05:22   CARDIAC CATHETERIZATION  Result Date: 07/22/2021 Images from the original result were not included.   Mid LM to Dist LM lesion is 60% stenosed.   Prox LAD lesion is 90% stenosed.   Ost Cx to Mid Cx lesion is  99% stenosed.   Ost RCA to Prox RCA lesion is 100% stenosed.   Ost Ramus to Ramus lesion is 80% stenosed.   Ost 1st Diag to 1st Diag lesion is 100% stenosed.   Ost RPDA to RPDA lesion is 75% stenosed.   2nd Mrg lesion is 60% stenosed.   Dist LAD lesion is 70% stenosed.   LIMA and is large.   SVG and is large.   SVG and is normal in caliber.   SVG and is large.   The graft exhibits no disease.   The graft exhibits no disease.  The graft exhibits no disease.   LV end diastolic pressure is mildly elevated.   The left ventricular ejection fraction is 35-45% by visual estimate. ANISAH KUCK is a 81 y.o. female  [id number] LOCATION:  FACILITY: [place] PHYSICIAN: Quay Burow, M.D. 01/07/1941 DATE OF PROCEDURE:  07/22/2021 DATE OF DISCHARGE: CARDIAC CATHETERIZATION History obtained from patient's chart.: 81 y.o. female w/ PMH of CAD (s/p CABG in 2013, DES to D1 in 10/2016, cath in 07/2018 showing patent grafts with occlusion of D1 at prior stent site and progression of PDA disease --> medical management recommended), HFpEF, HTN, HLD, paroxysmal atrial fibrillation (not on anticoagulation given history of GIB), NSVT, chronic anemia and ESRD who is currently admitted for evaluation of recurrent atrial flutter and chest pain.  Chest pain pressure I suspect to be with is a dialysis patient is a right probably dialyzed and her medications.  Her enzymes were elevated to a troponin of 400.  Her EF was reduced by 2D echo to the 35% range.  She was referred for diagnostic coronary angiography.   Ms. Debord anatomy is for all intents and purposes unchanged from her prior cath performed by Dr. [place]ique in 2020.  All her grafts are patent.  Her LVEDP was 18.  Her EF looks to be in the 40 to 45% range with an anteroapical wall motion abnormality.  I suspect that her chest pain and mild enzyme leak was related to "demand ischemia".  Medical therapy will be recommended.  If femoral angiogram was performed and the stick appeared to be  slightly high and therefore Mynx closure was not performed.  The patient will need a manual hold in the recovery bay prior to going to the floor.  Dr. Domenic Polite, the patient's attending cardiologist, was notified of these results. Quay Burow. MD, [facility] 07/22/2021 11:53 AM    Portable chest 1 View  Result Date: 07/17/2021 CLINICAL DATA:  81 year old female with chest pain. EXAM: PORTABLE CHEST 1 VIEW COMPARISON:  Chest CT 07/16/2021 and earlier. FINDINGS: Portable AP upright view at 0402 hours. Prior CABG. Calcified aortic atherosclerosis. Larger lung volumes compared to 07/15/2021. Stable cardiac size and mediastinal contours. Mild diffuse increased pulmonary interstitial opacity does not appear significantly changed from December radiographs. Small pleural effusion remains appearance. No pneumothorax, consolidation, or areas of worsening ventilation. Right lung base ventilation has mildly improved from 2 days ago. Negative visible bowel gas. No acute osseous abnormality identified. IMPRESSION: Larger lung volumes with mildly improved basilar ventilation from 2 days ago. Stable cardiomegaly, small left pleural effusion, and nonspecific mild diffuse increased interstitial opacity. Electronically Signed   By: Genevie Ann M.D.   On: 07/17/2021 04:39   ECHOCARDIOGRAM COMPLETE  Result Date: 07/16/2021    ECHOCARDIOGRAM REPORT   Patient Name:   DENESHA BROUSE Date of Exam: 07/16/2021 Medical Rec #:  [id number]    Height:       61.0 in Accession #:    [id number]   Weight:       119.0 lb Date of Birth:  28-Apr-1941    BSA:          1.515 m Patient Age:    35 years     BP:           128/61 mmHg Patient Gender: F            HR:           76 bpm. Exam Location:  [facility] Procedure: 2D Echo, Cardiac Doppler and Color Doppler  Indications:    Chest Pain  History:        Patient has prior history of Echocardiogram examinations, most                 recent 03/15/2021. CAD and Previous Myocardial Infarction, Prior                 CABG,  Arrythmias:LBBB, Signs/Symptoms:Dyspnea; Risk                 Factors:Hypertension and Dyslipidemia.  Sonographer:    Wenda Low Referring Phys: [id number] [place]  1. Left ventricular ejection fraction, by estimation, is 30 to 35%. The left ventricle has moderately decreased function. The left ventricle demonstrates regional wall motion abnormalities (see scoring diagram/findings for description). There is mild left ventricular hypertrophy. Left ventricular diastolic parameters are consistent with Grade II diastolic dysfunction (pseudonormalization). Elevated left atrial pressure.  2. Right ventricular systolic function is moderately reduced. The right ventricular size is normal. There is mildly elevated pulmonary artery systolic pressure. The estimated right ventricular systolic pressure is 13.2 mmHg.  3. Left atrial size was severely dilated.  4. Right atrial size was mildly dilated.  5. The mitral valve is degenerative. Moderate mitral valve regurgitation. Mild to moderate mitral stenosis. MG 84mHg at 70bpm, MVA 1.2 cm^2 by continuity equation. Moderate mitral annular calcification.  6. Tricuspid valve regurgitation is mild to moderate.  7. The aortic valve is tricuspid. Aortic valve regurgitation is not visualized. Aortic valve sclerosis/calcification is present, without any evidence of aortic stenosis.  8. The inferior vena cava is normal in size with greater than 50% respiratory variability, suggesting right atrial pressure of 3 mmHg. FINDINGS  Left Ventricle: Left ventricular ejection fraction, by estimation, is 30 to 35%. The left ventricle has moderately decreased function. The left ventricle demonstrates regional wall motion abnormalities. The left ventricular internal cavity size was normal in size. There is mild left ventricular hypertrophy. Left ventricular diastolic parameters are consistent with Grade II diastolic dysfunction (pseudonormalization). Elevated left atrial  pressure.  LV Wall Scoring: The entire anterior wall, entire anterior septum, mid inferoseptal segment, and apex are akinetic. The apical lateral segment and mid anterolateral segment are hypokinetic. The entire inferior wall, posterior wall, basal anterolateral segment, and basal inferoseptal segment are normal. Right Ventricle: The right ventricular size is normal. Right vetricular wall thickness was not well visualized. Right ventricular systolic function is moderately reduced. There is mildly elevated pulmonary artery systolic pressure. The tricuspid regurgitant velocity is 3.18 m/s, and with an assumed right atrial pressure of 3 mmHg, the estimated right ventricular systolic pressure is 444.0mmHg. Left Atrium: Left atrial size was severely dilated. Right Atrium: Right atrial size was mildly dilated. Pericardium: Trivial pericardial effusion is present. Mitral Valve: The mitral valve is degenerative in appearance. Moderate mitral annular calcification. Moderate mitral valve regurgitation. Mild to moderate mitral valve stenosis. MV peak gradient, 14.3 mmHg. The mean mitral valve gradient is 5.0 mmHg. Tricuspid Valve: The tricuspid valve is normal in structure. Tricuspid valve regurgitation is mild to moderate. Aortic Valve: The aortic valve is tricuspid. Aortic valve regurgitation is not visualized. Aortic valve sclerosis/calcification is present, without any evidence of aortic stenosis. Aortic valve mean gradient measures 4.0 mmHg. Aortic valve peak gradient measures 8.6 mmHg. Aortic valve area, by VTI measures 1.60 cm. Pulmonic Valve: The pulmonic valve was not well visualized. Pulmonic valve regurgitation is trivial. Aorta: The aortic root and ascending aorta are structurally normal, with no evidence of dilitation.  Venous: The inferior vena cava is normal in size with greater than 50% respiratory variability, suggesting right atrial pressure of 3 mmHg. IAS/Shunts: The interatrial septum was not well  visualized.  LEFT VENTRICLE PLAX 2D LVIDd:         4.00 cm     Diastology LVIDs:         3.40 cm     LV e' medial:    3.75 cm/s LV PW:         1.20 cm     LV E/e' medial:  36.0 LV IVS:        1.20 cm     LV e' lateral:   5.63 cm/s LVOT diam:     1.90 cm     LV E/e' lateral: 24.0 LV SV:         52 LV SV Index:   35 LVOT Area:     2.84 cm  LV Volumes (MOD) LV vol d, MOD A2C: 53.9 ml LV vol d, MOD A4C: 52.6 ml LV vol s, MOD A2C: 36.8 ml LV vol s, MOD A4C: 32.7 ml LV SV MOD A2C:     17.1 ml LV SV MOD A4C:     52.6 ml LV SV MOD BP:      17.8 ml RIGHT VENTRICLE RV Basal diam:  3.90 cm RV Mid diam:    3.30 cm RV S prime:     6.23 cm/s TAPSE (M-mode): 1.5 cm LEFT ATRIUM             Index        RIGHT ATRIUM           Index LA diam:        4.40 cm 2.90 cm/m   RA Area:     16.50 cm LA Vol (A2C):   80.8 ml 53.34 ml/m  RA Volume:   46.10 ml  30.43 ml/m LA Vol (A4C):   72.0 ml 47.53 ml/m LA Biplane Vol: 78.6 ml 51.89 ml/m  AORTIC VALVE                    PULMONIC VALVE AV Area (Vmax):    1.33 cm     PV Vmax:       0.60 m/s AV Area (Vmean):   1.48 cm     PV Peak grad:  1.4 mmHg AV Area (VTI):     1.60 cm AV Vmax:           147.00 cm/s AV Vmean:          93.450 cm/s AV VTI:            0.328 m AV Peak Grad:      8.6 mmHg AV Mean Grad:      4.0 mmHg LVOT Vmax:         69.20 cm/s LVOT Vmean:        48.900 cm/s LVOT VTI:          0.185 m LVOT/AV VTI ratio: 0.56  AORTA Ao Root diam: 2.70 cm Ao Asc diam:  2.50 cm MITRAL VALVE                  TRICUSPID VALVE MV Area (PHT): 3.54 cm       TR Peak grad:   40.4 mmHg MV Area VTI:   1.21 cm       TR Vmax:        318.00 cm/s MV Peak grad:  14.3 mmHg  MV Mean grad:  5.0 mmHg       SHUNTS MV Vmax:       1.89 m/s       Systemic VTI:  0.18 m MV Vmean:      95.3 cm/s      Systemic Diam: 1.90 cm MV Decel Time: 214 msec MR Peak grad:    100.0 mmHg MR Mean grad:    65.0 mmHg MR Vmax:         500.00 cm/s MR Vmean:        377.0 cm/s MR PISA:         1.57 cm MR PISA Eff ROA: 29 mm MR PISA  Radius:  0.50 cm MV E velocity: 135.00 cm/s MV A velocity: 79.30 cm/s MV E/A ratio:  1.70 Oswaldo Milian MD Electronically signed by Oswaldo Milian MD Signature Date/Time: 07/16/2021/6:25:53 PM    Final       Subjective: No new complaints.   Discharge Exam: Vitals:   07/24/21 1106 07/24/21 1200  BP: (!) 108/36 (!) 125/112  Pulse: 71 (!) 46  Resp: 19 (!) 24  Temp: (!) 97.5 F (36.4 C)   SpO2: 100% 100%   Vitals:   07/24/21 0450 07/24/21 0800 07/24/21 1106 07/24/21 1200  BP: (!) 107/38 (!) 122/41 (!) 108/36 (!) 125/112  Pulse: 60 (!) 54 71 (!) 46  Resp: _0 (!) 24  Temp: 97.6 F (36.4 C) 97.6 F (36.4 C) (!) 97.5 F (36.4 C)   TempSrc: Oral Oral Oral   SpO2: 98% 100% 100% 100%  Weight:      Height:        General: Pt is alert, awake, not in acute distress Cardiovascular: RRR, S1/S2 +, no rubs, no gallops Respiratory: CTA bilaterally, no wheezing, no rhonchi Abdominal: Soft, NT, ND, bowel sounds + Extremities: no edema, no cyanosis    The results of significant diagnostics from this hospitalization (including imaging, microbiology, ancillary and laboratory) are listed below for reference.     Microbiology: Recent Results (from the past 240 hour(s))  Resp Panel by RT-PCR (Flu A&B, Covid) Nasopharyngeal Swab     Status: None   Collection Time: 07/16/21  4:11 AM   Specimen: Nasopharyngeal Swab; Nasopharyngeal(NP) swabs in vial transport medium  Result Value Ref Range Status   SARS Coronavirus 2 by RT PCR NEGATIVE NEGATIVE Final    Comment: (NOTE) SARS-CoV-2 target nucleic acids are NOT DETECTED.  The SARS-CoV-2 RNA is generally detectable in upper respiratory specimens during the acute phase of infection. The lowest concentration of SARS-CoV-2 viral copies this assay can detect is 138 copies/mL. A negative result does not preclude SARS-Cov-2 infection and should not be used as the sole basis for treatment or other patient management decisions. A  negative result may occur with  improper specimen collection/handling, submission of specimen other than nasopharyngeal swab, presence of viral mutation(s) within the areas targeted by this assay, and inadequate number of viral copies(<138 copies/mL). A negative result must be combined with clinical observations, patient history, and epidemiological information. The expected result is Negative.  Fact Sheet for Patients:  EntrepreneurPulse.com.au  Fact Sheet for Healthcare Providers:  IncredibleEmployment.be  This test is no t yet approved or cleared by the [place] FDA and  has been authorized for detection and/or diagnosis of SARS-CoV-2 by FDA under an Emergency Use Authorization (EUA). This EUA will remain  in effect (meaning this test can be used) for the duration of the COVID-19 declaration under Section 564(b)(1) of  the Act, 21 U.S.C.section 360bbb-3(b)(1), unless the authorization is terminated  or revoked sooner.       Influenza A by PCR NEGATIVE NEGATIVE Final   Influenza B by PCR NEGATIVE NEGATIVE Final    Comment: (NOTE) The Xpert Xpress SARS-CoV-2/FLU/RSV plus assay is intended as an aid in the diagnosis of influenza from Nasopharyngeal swab specimens and should not be used as a sole basis for treatment. Nasal washings and aspirates are unacceptable for Xpert Xpress SARS-CoV-2/FLU/RSV testing.  Fact Sheet for Patients: EntrepreneurPulse.com.au  Fact Sheet for Healthcare Providers: IncredibleEmployment.be  This test is not yet approved or cleared by the [place] FDA and has been authorized for detection and/or diagnosis of SARS-CoV-2 by FDA under an Emergency Use Authorization (EUA). This EUA will remain in effect (meaning this test can be used) for the duration of the COVID-19 declaration under Section 564(b)(1) of the Act, 21 U.S.C. section 360bbb-3(b)(1), unless the authorization  is terminated or revoked.  Performed at [facility], [address], [place], [place] [address]   Culture, blood (Routine X 2) w Reflex to ID Panel     Status: None   Collection Time: 07/16/21  6:31 AM   Specimen: BLOOD RIGHT HAND  Result Value Ref Range Status   Specimen Description BLOOD RIGHT HAND  Final   Special Requests   Final    BOTTLES DRAWN AEROBIC ONLY Blood Culture results may not be optimal due to an inadequate volume of blood received in culture bottles   Culture   Final    NO GROWTH 5 DAYS Performed at [facility], [address], [place], [place] [address]    Report Status 07/21/2021 FINAL  Final  Culture, blood (Routine X 2) w Reflex to ID Panel     Status: None   Collection Time: 07/16/21  6:31 AM   Specimen: Right Antecubital; Blood  Result Value Ref Range Status   Specimen Description RIGHT ANTECUBITAL  Final   Special Requests   Final    BOTTLES DRAWN AEROBIC ONLY Blood Culture adequate volume   Culture   Final    NO GROWTH 5 DAYS Performed at [facility], [address], [place], [place] [address]    Report Status 07/21/2021 FINAL  Final  Culture, blood (routine x 2)     Status: None   Collection Time: 07/16/21  5:40 PM   Specimen: Porta Cath; Blood  Result Value Ref Range Status   Specimen Description PORTA CATH ARTERIAL AVF  Final   Special Requests   Final    BOTTLES DRAWN AEROBIC AND ANAEROBIC Blood Culture adequate volume   Culture   Final    NO GROWTH 5 DAYS Performed at [facility], [address] [place], [place] [address]    Report Status 07/21/2021 FINAL  Final  Culture, blood (routine x 2)     Status: None   Collection Time: 07/16/21  5:55 PM   Specimen: Porta Cath; Blood  Result Value Ref Range Status   Specimen Description PORTA CATH VENOUS AVF  Final   Special Requests   Final    BOTTLES DRAWN AEROBIC AND ANAEROBIC Blood Culture adequate volume   Culture   Final    NO GROWTH 5 DAYS Performed at [facility], [address]  [place], [place] [address]    Report Status 07/21/2021 FINAL  Final  MRSA Next Gen by PCR, Nasal     Status: None   Collection Time: 07/16/21  7:14 PM   Specimen: Nasal Mucosa;  Nasal Swab  Result Value Ref Range Status   MRSA by PCR Next Gen NOT DETECTED NOT DETECTED Final    Comment: (NOTE) The GeneXpert MRSA Assay (FDA approved for NASAL specimens only), is one component of a comprehensive MRSA colonization surveillance program. It is not intended to diagnose MRSA infection nor to guide or monitor treatment for MRSA infections. Test performance is not FDA approved in patients less than 23 years old. Performed at [facility], [address], [place], [place] [address]      Labs: BNP (last 3 results) Recent Labs    09/29/20 0112  BNP 992.4*   Basic Metabolic Panel: Recent Labs  Lab 07/19/21 0355 07/21/21 0451 07/22/21 0415 07/23/21 0410  NA 136 134* 135 130*  K 5.2* 3.9 4.0 4.5  CL 94* 95* 98 91*  CO2 _0 GLUCOSE 73 95 83 133*  BUN 62* 52* 32* 52*  CREATININE 8.94* 7.62* 5.57* 7.83*  CALCIUM 9.1 9.4 9.5 9.6  MG  --   --  1.9 1.9  PHOS 3.9 3.8  --   --    Liver Function Tests: Recent Labs  Lab 07/19/21 0355 07/21/21 0451  ALBUMIN 3.2* 3.0*   No results for input(s): LIPASE, AMYLASE in the last 168 hours. No results for input(s): AMMONIA in the last 168 hours. CBC: Recent Labs  Lab 07/19/21 0616 07/20/21 0915 07/21/21 0451 07/22/21 0415 07/23/21 0410  WBC 6.6 5.5 5.6 4.0 10.8*  HGB 8.1* 8.3* 7.8* 8.0* 9.5*  HCT 25.6* 27.1* 24.4* 25.7* 28.7*  MCV 115.3* 113.9* 113.5* 112.2* 108.7*  PLT 163 165 175 137* PLATELET CLUMPS NOTED ON SMEAR, UNABLE TO ESTIMATE   Cardiac Enzymes: No results for input(s): CKTOTAL, CKMB, CKMBINDEX, TROPONINI in the last 168 hours. BNP: Invalid input(s): POCBNP CBG: Recent Labs  Lab 07/19/21 2041  GLUCAP 113*   D-Dimer No results for input(s): DDIMER in the last 72 hours. Hgb A1c No results for input(s): HGBA1C in  the last 72 hours. Lipid Profile No results for input(s): CHOL, HDL, LDLCALC, TRIG, CHOLHDL, LDLDIRECT in the last 72 hours. Thyroid function studies No results for input(s): TSH, T4TOTAL, T3FREE, THYROIDAB in the last 72 hours.  Invalid input(s): FREET3 Anemia work up No results for input(s): VITAMINB12, FOLATE, FERRITIN, TIBC, IRON, RETICCTPCT in the last 72 hours. Urinalysis    Component Value Date/Time   COLORURINE YELLOW 12/16/2012 1919   APPEARANCEUR CLOUDY (A) 12/16/2012 1919   LABSPEC 1.009 12/16/2012 1919   PHURINE 7.5 12/16/2012 1919   GLUCOSEU NEGATIVE 12/16/2012 1919   HGBUR TRACE (A) 12/16/2012 1919   BILIRUBINUR NEGATIVE 12/16/2012 1919   KETONESUR NEGATIVE 12/16/2012 1919   PROTEINUR 100 (A) 12/16/2012 1919   UROBILINOGEN 0.2 12/16/2012 1919   NITRITE NEGATIVE 12/16/2012 1919   LEUKOCYTESUR SMALL (A) 12/16/2012 1919   Sepsis Labs Invalid input(s): PROCALCITONIN,  WBC,  LACTICIDVEN Microbiology Recent Results (from the past 240 hour(s))  Resp Panel by RT-PCR (Flu A&B, Covid) Nasopharyngeal Swab     Status: None   Collection Time: 07/16/21  4:11 AM   Specimen: Nasopharyngeal Swab; Nasopharyngeal(NP) swabs in vial transport medium  Result Value Ref Range Status   SARS Coronavirus 2 by RT PCR NEGATIVE NEGATIVE Final    Comment: (NOTE) SARS-CoV-2 target nucleic acids are NOT DETECTED.  The SARS-CoV-2 RNA is generally detectable in upper respiratory specimens during the acute phase of infection. The lowest concentration of SARS-CoV-2 viral copies this assay can detect is 138 copies/mL. A negative result  does not preclude SARS-Cov-2 infection and should not be used as the sole basis for treatment or other patient management decisions. A negative result may occur with  improper specimen collection/handling, submission of specimen other than nasopharyngeal swab, presence of viral mutation(s) within the areas targeted by this assay, and inadequate number of  viral copies(<138 copies/mL). A negative result must be combined with clinical observations, patient history, and epidemiological information. The expected result is Negative.  Fact Sheet for Patients:  EntrepreneurPulse.com.au  Fact Sheet for Healthcare Providers:  IncredibleEmployment.be  This test is no t yet approved or cleared by the [place] FDA and  has been authorized for detection and/or diagnosis of SARS-CoV-2 by FDA under an Emergency Use Authorization (EUA). This EUA will remain  in effect (meaning this test can be used) for the duration of the COVID-19 declaration under Section 564(b)(1) of the Act, 21 U.S.C.section 360bbb-3(b)(1), unless the authorization is terminated  or revoked sooner.       Influenza A by PCR NEGATIVE NEGATIVE Final   Influenza B by PCR NEGATIVE NEGATIVE Final    Comment: (NOTE) The Xpert Xpress SARS-CoV-2/FLU/RSV plus assay is intended as an aid in the diagnosis of influenza from Nasopharyngeal swab specimens and should not be used as a sole basis for treatment. Nasal washings and aspirates are unacceptable for Xpert Xpress SARS-CoV-2/FLU/RSV testing.  Fact Sheet for Patients: EntrepreneurPulse.com.au  Fact Sheet for Healthcare Providers: IncredibleEmployment.be  This test is not yet approved or cleared by the [place] FDA and has been authorized for detection and/or diagnosis of SARS-CoV-2 by FDA under an Emergency Use Authorization (EUA). This EUA will remain in effect (meaning this test can be used) for the duration of the COVID-19 declaration under Section 564(b)(1) of the Act, 21 U.S.C. section 360bbb-3(b)(1), unless the authorization is terminated or revoked.  Performed at [facility], [address] [place], [place] [address]   Culture, blood (Routine X 2) w Reflex to ID Panel     Status: None   Collection Time: 07/16/21  6:31 AM   Specimen: BLOOD  RIGHT HAND  Result Value Ref Range Status   Specimen Description BLOOD RIGHT HAND  Final   Special Requests   Final    BOTTLES DRAWN AEROBIC ONLY Blood Culture results may not be optimal due to an inadequate volume of blood received in culture bottles   Culture   Final    NO GROWTH 5 DAYS Performed at [facility], [address] [place], [place] [address]    Report Status 07/21/2021 FINAL  Final  Culture, blood (Routine X 2) w Reflex to ID Panel     Status: None   Collection Time: 07/16/21  6:31 AM   Specimen: Right Antecubital; Blood  Result Value Ref Range Status   Specimen Description RIGHT ANTECUBITAL  Final   Special Requests   Final    BOTTLES DRAWN AEROBIC ONLY Blood Culture adequate volume   Culture   Final    NO GROWTH 5 DAYS Performed at [facility], [address], [place], [place] [address]    Report Status 07/21/2021 FINAL  Final  Culture, blood (routine x 2)     Status: None   Collection Time: 07/16/21  5:40 PM   Specimen: Porta Cath; Blood  Result Value Ref Range Status   Specimen Description PORTA CATH ARTERIAL AVF  Final   Special Requests   Final    BOTTLES DRAWN AEROBIC AND ANAEROBIC Blood Culture adequate volume   Culture  Final    NO GROWTH 5 DAYS Performed at [facility], [address], [place], [place] [address]    Report Status 07/21/2021 FINAL  Final  Culture, blood (routine x 2)     Status: None   Collection Time: 07/16/21  5:55 PM   Specimen: Porta Cath; Blood  Result Value Ref Range Status   Specimen Description PORTA CATH VENOUS AVF  Final   Special Requests   Final    BOTTLES DRAWN AEROBIC AND ANAEROBIC Blood Culture adequate volume   Culture   Final    NO GROWTH 5 DAYS Performed at [facility], [address], [place], [place] [address]    Report Status 07/21/2021 FINAL  Final  MRSA Next Gen by PCR, Nasal     Status: None   Collection Time: 07/16/21  7:14 PM   Specimen: Nasal Mucosa; Nasal Swab  Result Value Ref Range Status   MRSA  by PCR Next Gen NOT DETECTED NOT DETECTED Final    Comment: (NOTE) The GeneXpert MRSA Assay (FDA approved for NASAL specimens only), is one component of a comprehensive MRSA colonization surveillance program. It is not intended to diagnose MRSA infection nor to guide or monitor treatment for MRSA infections. Test performance is not FDA approved in patients less than 77 years old. Performed at [facility], [address], [place], [place] [address]      Time coordinating discharge: 39 minutes.  SIGNED:   Hosie Poisson, MD  [facility] 07/24/2021, 7:11 PM

## 2021-07-24 NOTE — Progress Notes (Signed)
Patient's blood pressure is low this am.  102/36, MAP is 56.  Patient is asymptomatic, patient is sleeping, but she is able to be aroused and patient states that she does not feel dizzy or light headed.  Will continue to monitor.   Donah Driver, RN

## 2021-07-24 NOTE — Progress Notes (Signed)
Admit: 07/15/2021 LOS: 8  74F ESRD MWF [facility] with CP  Subjective:  HD yesterday- did well.  For d/c today.  01/13 0701 - 01/14 0700 In: -  Out: 2500   Filed Weights   07/21/21 1505 07/22/21 1505 07/23/21 1737  Weight: 55.5 kg 59.4 kg 59.4 kg    Scheduled Meds:  ALPRAZolam  0.5 mg Oral QHS   amiodarone  200 mg Oral Daily   aspirin EC  81 mg Oral Daily   darbepoetin (ARANESP) injection - DIALYSIS  150 mcg Intravenous Q Fri-HD   isosorbide mononitrate  90 mg Oral BID   levothyroxine  25 mcg Oral Daily   metoprolol tartrate  12.5 mg Oral BID   midodrine  5 mg Oral Q M,W,F-HD   multivitamin  1 tablet Oral Daily   rosuvastatin  10 mg Oral Daily   sevelamer carbonate  2,400 mg Oral TID WC   sodium chloride flush  3 mL Intravenous Q12H   Continuous Infusions:  sodium chloride     sodium chloride     sodium chloride     sodium chloride     PRN Meds:.sodium chloride, sodium chloride, sodium chloride, sodium chloride, acetaminophen, bisacodyl, diphenhydrAMINE, guaiFENesin-dextromethorphan, hydrOXYzine, ibuprofen, lidocaine (PF), lidocaine-prilocaine, nitroGLYCERIN, ondansetron **OR** ondansetron (ZOFRAN) IV, ondansetron (ZOFRAN) IV, pentafluoroprop-tetrafluoroeth, sodium chloride flush, sodium chloride flush  Current Labs: reviewed    Physical Exam:  Blood pressure (!) 108/36, pulse 71, temperature (!) 97.5 F (36.4 C), temperature source Oral, resp. rate 19, height 5\' 1"  (1.549 m), weight 59.4 kg, SpO2 100 %. Gen: elderly female, NAD, sitting in chair CV: s1s2, rrr Resp: clear Abd: soft, nt/nd Ext: no edema Dialysis access: LUE AVG +b/t    Recent Labs  Lab 07/19/21 0355 07/21/21 0451 07/22/21 0415 07/23/21 0410  NA 136 134* 135 130*  K 5.2* 3.9 4.0 4.5  CL 94* 95* 98 91*  CO2 24 26 26 24   GLUCOSE 73 95 83 133*  BUN 62* 52* 32* 52*  CREATININE 8.94* 7.62* 5.57* 7.83*  CALCIUM 9.1 9.4 9.5 9.6  PHOS 3.9 3.8  --   --    Recent Labs  Lab 07/21/21 0451  07/22/21 0415 07/23/21 0410  WBC 5.6 4.0 10.8*  HGB 7.8* 8.0* 9.5*  HCT 24.4* 25.7* 28.7*  MCV 113.5* 112.2* 108.7*  PLT 175 137* PLATELET CLUMPS NOTED ON SMEAR, UNABLE TO ESTIMATE     Dialysis Orders:  [facility] - MWF-  3.5 hours  EDW 54. HD Bath 2k/2.5 ca, Cellent 17H KOA, Heparin none. Access LUE AVG BFR 300, DFR 500- 15 guage. Micera 200 mcg IV every 2 weeks  A ESRD MWF, on schedule: 2K, 2-3L UF, no heparin, AVG, 3.5h, 300/500 BFR/DFR. HD 07/23/21 NSTEMI, known severe CAD,CP, cath unchanged from 2020.  Medical therapy Anemia, had some decline inpaitent, rec'd 1u PRBC. Stool occult neg 1/10. 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

## 2021-07-24 NOTE — Plan of Care (Signed)
  Problem: Education: Goal: Knowledge of General Education information will improve Description: Including pain rating scale, medication(s)/side effects and non-pharmacologic comfort measures Outcome: Adequate for Discharge   Problem: Health Behavior/Discharge Planning: Goal: Ability to manage health-related needs will improve Outcome: Adequate for Discharge   Problem: Clinical Measurements: Goal: Ability to maintain clinical measurements within normal limits will improve Outcome: Adequate for Discharge Goal: Will remain free from infection Outcome: Adequate for Discharge Goal: Diagnostic test results will improve Outcome: Adequate for Discharge Goal: Respiratory complications will improve Outcome: Adequate for Discharge Goal: Cardiovascular complication will be avoided Outcome: Adequate for Discharge   Problem: Activity: Goal: Risk for activity intolerance will decrease Outcome: Adequate for Discharge   Problem: Nutrition: Goal: Adequate nutrition will be maintained Outcome: Adequate for Discharge   Problem: Coping: Goal: Level of anxiety will decrease Outcome: Adequate for Discharge   Problem: Elimination: Goal: Will not experience complications related to bowel motility Outcome: Adequate for Discharge Goal: Will not experience complications related to urinary retention Outcome: Adequate for Discharge   Problem: Pain Managment: Goal: General experience of comfort will improve Outcome: Adequate for Discharge   Problem: Safety: Goal: Ability to remain free from injury will improve Outcome: Adequate for Discharge   Problem: Skin Integrity: Goal: Risk for impaired skin integrity will decrease Outcome: Adequate for Discharge   

## 2021-07-24 NOTE — Progress Notes (Signed)
HOSPITAL MEDICINE OVERNIGHT EVENT NOTE    Notified by nursing that patient's last blood pressure is 107/38.  While overall this blood pressure may be slightly lower than the patient's baseline, nursing reports the patient is not experiencing any associated lightheadedness weakness or confusion with this blood pressure.  In the absence of any symptoms I do not believe any acute intervention necessary at this time.  We will continue to monitor.  Shawna Emerald  MD [facility]

## 2021-07-24 NOTE — Progress Notes (Signed)
Nursing DC note  Patient and husband alert and oriented. Both verbalized understanding of dc instructions. All belongings and paperwork given to patient. Patients husband taking her home via car.

## 2021-07-26 NOTE — Progress Notes (Signed)
Late Entry Note:  Pt was d/c to home on 07/24/21. Contacted [facility] this am to advise RN pt should resume regular schedule today. D/C summary faxed to clinic for continuation of care.   Melven Sartorius Renal Navigator [phone]

## 2021-07-29 ENCOUNTER — Encounter: Payer: Self-pay | Admitting: Student

## 2021-07-29 ENCOUNTER — Ambulatory Visit (INDEPENDENT_AMBULATORY_CARE_PROVIDER_SITE_OTHER): Payer: Medicare HMO | Admitting: Student

## 2021-07-29 ENCOUNTER — Other Ambulatory Visit: Payer: Self-pay

## 2021-07-29 VITALS — BP 116/54 | HR 79 | Ht 61.0 in | Wt 126.0 lb

## 2021-07-29 DIAGNOSIS — I5020 Unspecified systolic (congestive) heart failure: Secondary | ICD-10-CM

## 2021-07-29 DIAGNOSIS — N186 End stage renal disease: Secondary | ICD-10-CM

## 2021-07-29 DIAGNOSIS — I25118 Atherosclerotic heart disease of native coronary artery with other forms of angina pectoris: Secondary | ICD-10-CM

## 2021-07-29 DIAGNOSIS — D631 Anemia in chronic kidney disease: Secondary | ICD-10-CM

## 2021-07-29 DIAGNOSIS — I480 Paroxysmal atrial fibrillation: Secondary | ICD-10-CM

## 2021-07-29 DIAGNOSIS — Z992 Dependence on renal dialysis: Secondary | ICD-10-CM

## 2021-07-29 DIAGNOSIS — E785 Hyperlipidemia, unspecified: Secondary | ICD-10-CM

## 2021-07-29 MED ORDER — METOPROLOL SUCCINATE ER 25 MG PO TB24: 25.0000 mg | ORAL_TABLET | Freq: Every day | ORAL | 3 refills | Status: DC

## 2021-07-29 NOTE — Progress Notes (Signed)
Cardiology Office Note    Date:  07/29/2021   ID:  Shawna [name], DOB 11/24/1940, MRN [id number]  PCP:  [facility]  Cardiologist: Carlyle Dolly, MD    Chief Complaint  Patient presents with   Hospitalization Follow-up    History of Present Illness:    Shawna [name] is a 81 y.o. female CAD (s/p CABG in 2013, DES to D1 in 10/2016, cath in 07/2018 showing patent grafts with occlusion of D1 at prior stent site and progression of PDA disease --> medical management recommended), HFrEF, HTN, HLD, paroxysmal atrial fibrillation (not on anticoagulation given history of GIB), NSVT, chronic anemia and ESRD who presents to the office today for hospital follow-up.  She was recently admitted to [facility] earlier this month for an NSTEMI and troponin values peaked at 449. Repeat echocardiogram showed her EF was reduced at 30 to 35% with the entire anterior wall, anterior septum, inferior septal segment and apex being akinetic. Work-up was intermittently postponed given her anemia she required 1 unit of pRBC's during admission. She was transferred to [facility] for a cardiac catheterization which showed her anatomy was overall similar to prior cath in 2020 with all grafts being patent and residual 75% stenosis along PDA with medical management recommended.  Imdur was titrated to 90 mg twice daily during admission and it was recommended to change Lopressor to Toprol-XL but it appears she was discharged on Lopressor 12.5 mg twice daily. Was recommended to consider adding Hydralazine as outpatient if BP allowed.  In talking with the patient and her husband today, she reports one episode of chest pain since hospital discharge which resolved with nitroglycerin. She is unsure if she is taking Lopressor or Crestor at this time. No recent orthopnea, PND or pitting edema. She did have dialysis yesterday and says her session was cut short due to hypotension and she did take extra Midodrine. She  monitors her fluid intake closely as she no longer urinates.   Past Medical History:  Diagnosis Date   Acute on chronic respiratory failure with hypoxia ([place]) 10/10/2016   Anxiety    Arthritis    AVM (arteriovenous malformation) of colon    CAD (coronary artery disease)    a. s/p CABG in 2013 b. DES to D1 in 10/2016. c. cath in 07/2018 showing patent grafts with occlusion of D1 at prior stent site and progression of PDA disease --> medical management recommended   Carotid artery disease ([place])    a. 56-43% LICA, 09/2949    Chronic anemia    Chronic bronchitis (HCC)    Chronic diastolic CHF (congestive heart failure) ([place])    a. 02/2012 Echo EF 60-65%, nl wall motion, Gr 1 DD, mod MR   Colon cancer ([place]) 1992   Esophageal stricture    ESRD on hemodialysis ([place])    ESRD due to HTN, started dialysis 2011 and gets HD at [facility] with Dr Hinda Lenis on MWF schedule.  Access is LUA AVF as of Sept 2014.    GERD (gastroesophageal reflux disease)    High cholesterol 12/2011   History of blood transfusion 07/2011; 12/2011; 01/2012 X 2; 04/2012   History of gout    History of lower GI bleeding    Hypertension    Iron deficiency anemia    Jugular vein occlusion, right (HCC)    Mitral regurgitation    a. Moderate by echo, 02/2012   Myocardial infarction [facility])    NSVT (nonsustained ventricular tachycardia)  Ovarian cancer ([name]) 1992   PAF (paroxysmal atrial fibrillation) ([place])    Pneumonia ~ 2009   PUD (peptic ulcer disease)    TIA (transient ischemic attack)     Past Surgical History:  Procedure Laterality Date   A/V SHUNTOGRAM Left 03/19/2019   Procedure: A/V SHUNTOGRAM;  Surgeon: Katha Cabal, MD;  Location: [place] [facility];  Service: Cardiovascular;  Laterality: Left;   ABDOMINAL HYSTERECTOMY  1992   APPENDECTOMY  06/1990   AV FISTULA PLACEMENT  07/2009   left upper arm   AV FISTULA PLACEMENT Right 09/06/2016   Procedure: RIGHT FOREARM ARTERIOVENOUS (AV) GRAFT;  Surgeon:  Elam Dutch, MD;  Location: [place];  Service: Vascular;  Laterality: Right;   AV FISTULA PLACEMENT N/A 02/24/2017   Procedure: INSERTION OF ARTERIOVENOUS (AV) GORE-TEX GRAFT ARM (BRACHIAL AXILLARY);  Surgeon: Katha Cabal, MD;  Location: [facility];  Service: Vascular;  Laterality: N/A;   [place] Right 09/06/2016   Procedure: REMOVAL OF Right Arm ARTERIOVENOUS GORETEX GRAFT and Vein Patch angioplasty of brachial artery;  Surgeon: Angelia Mould, MD;  Location: [place];  Service: Vascular;  Laterality: Right;   BIOPSY  09/26/2019   Procedure: BIOPSY;  Surgeon: Rogene Houston, MD;  [facility];  Service: Endoscopy;;   COLON RESECTION  1992   COLON SURGERY     COLONOSCOPY N/A 03/09/2019   Procedure: COLONOSCOPY;  Surgeon: Rogene Houston, MD;  [facility];  Service: Endoscopy;  Laterality: N/A;   COLONOSCOPY WITH PROPOFOL N/A 06/08/2021   Procedure: COLONOSCOPY WITH PROPOFOL;  Surgeon: Harvel Quale, MD;  [facility];  Service: Gastroenterology;  Laterality: N/A;  9:05 /Patient is on dialysis Mon Wed Fri   CORONARY ANGIOPLASTY WITH STENT PLACEMENT  12/15/11   "2"   CORONARY ANGIOPLASTY WITH STENT PLACEMENT  y/2013   "1; makes total of 3" (05/02/2012)   CORONARY ARTERY BYPASS GRAFT  06/13/2012   Procedure: CORONARY ARTERY BYPASS GRAFTING (CABG);  Surgeon: Grace Isaac, MD;  Location: [place];  Service: Open Heart Surgery;  Laterality: N/A;  cabg x four;  using left internal mammary artery, and left leg greater saphenous vein harvested endoscopically   CORONARY STENT INTERVENTION N/A 10/13/2016   Procedure: Coronary Stent Intervention;  Surgeon: Troy Sine, MD;  Location: [place] [facility];  Service: Cardiovascular;  Laterality: N/A;   DIALYSIS/PERMA CATHETER REMOVAL N/A 04/18/2017   Procedure: DIALYSIS/PERMA CATHETER REMOVAL;  Surgeon: Katha Cabal, MD;  Location: [place] [facility];  Service: Cardiovascular;  Laterality:  N/A;   DILATION AND CURETTAGE OF UTERUS     ENTEROSCOPY N/A 06/08/2021   Procedure: PUSH ENTEROSCOPY;  Surgeon: Harvel Quale, MD;  [facility];  Service: Gastroenterology;  Laterality: N/A;   ESOPHAGOGASTRODUODENOSCOPY  01/20/2012   Procedure: ESOPHAGOGASTRODUODENOSCOPY (EGD);  Surgeon: Ladene Artist, MD,FACG;  Location: [facility] ENDOSCOPY;  Service: Endoscopy;  Laterality: N/A;   ESOPHAGOGASTRODUODENOSCOPY N/A 03/26/2013   Procedure: ESOPHAGOGASTRODUODENOSCOPY (EGD);  Surgeon: Irene Shipper, MD;  Location: [facility] ENDOSCOPY;  Service: Endoscopy;  Laterality: N/A;   ESOPHAGOGASTRODUODENOSCOPY N/A 04/30/2015   Procedure: ESOPHAGOGASTRODUODENOSCOPY (EGD);  Surgeon: Rogene Houston, MD;  [facility];  Service: Endoscopy;  Laterality: N/A;  1pm - moved to 10/20 @ 1:10   ESOPHAGOGASTRODUODENOSCOPY N/A 07/29/2016   Procedure: ESOPHAGOGASTRODUODENOSCOPY (EGD);  Surgeon: Manus Gunning, MD;  Location: [place];  Service: Gastroenterology;  Laterality: N/A;  enteroscopy   ESOPHAGOGASTRODUODENOSCOPY N/A 09/26/2019  Procedure: ESOPHAGOGASTRODUODENOSCOPY (EGD);  Surgeon: Rogene Houston, MD;  [facility];  Service: Endoscopy;  Laterality: N/A;  1250   ESOPHAGOGASTRODUODENOSCOPY (EGD) WITH PROPOFOL N/A 02/05/2021   Procedure: ESOPHAGOGASTRODUODENOSCOPY (EGD) WITH PROPOFOL;  Surgeon: Eloise Harman, DO;  [facility];  Service: Endoscopy;  Laterality: N/A;   GIVENS CAPSULE STUDY N/A 03/07/2019   Procedure: GIVENS CAPSULE STUDY;  Surgeon: Rogene Houston, MD;  [facility];  Service: Endoscopy;  Laterality: N/A;  7:30   GIVENS CAPSULE STUDY N/A 04/22/2021   Procedure: GIVENS CAPSULE STUDY;  Surgeon: Rogene Houston, MD;  [facility];  Service: Endoscopy;  Laterality: N/A;  7:30   INSERTION OF DIALYSIS CATHETER N/A 10/05/2020   Procedure: ABORTED TUNNELED DIALYSIS CATHETER PLACEMENT RIGHT INTERNAL JUGULAR VEIN ;  Surgeon:  Virl Cagey, MD;  Location: [facility];  Service: General;  Laterality: N/A;   INTRAOPERATIVE TRANSESOPHAGEAL ECHOCARDIOGRAM  06/13/2012   Procedure: INTRAOPERATIVE TRANSESOPHAGEAL ECHOCARDIOGRAM;  Surgeon: Grace Isaac, MD;  Location: [place];  Service: Open Heart Surgery;  Laterality: N/A;   IR DIALY SHUNT INTRO NEEDLE/INTRACATH INITIAL W/IMG LEFT Left 10/06/2020   IR FLUORO GUIDE CV LINE RIGHT  06/17/2020   IR GENERIC HISTORICAL  07/26/2016   IR FLUORO GUIDE CV LINE RIGHT 07/26/2016 Greggory Keen, MD [facility]   IR GENERIC HISTORICAL  07/26/2016   IR US GUIDE VASC ACCESS RIGHT 07/26/2016 Greggory Keen, MD [facility]   IR GENERIC HISTORICAL  08/02/2016   IR US GUIDE VASC ACCESS RIGHT 08/02/2016 Greggory Keen, MD [facility]   IR GENERIC HISTORICAL  08/02/2016   IR FLUORO GUIDE CV LINE RIGHT 08/02/2016 Greggory Keen, MD [facility]   IR RADIOLOGY PERIPHERAL GUIDED IV START  03/28/2017   IR REMOVAL TUN CV CATH W/O FL  08/11/2020   IR THROMBECTOMY AV FISTULA W/THROMBOLYSIS INC/SHUNT/IMG LEFT Left 06/17/2020   IR US GUIDE VASC ACCESS LEFT  06/17/2020   IR US GUIDE VASC ACCESS RIGHT  03/28/2017   IR US GUIDE VASC ACCESS RIGHT  06/17/2020   LEFT HEART CATH AND CORONARY ANGIOGRAPHY N/A 09/20/2016   Procedure: Left Heart Cath and Coronary Angiography;  Surgeon: [name], MD;  Location: [place] [facility];  Service: Cardiovascular;  Laterality: N/A;   LEFT HEART CATH AND CORS/GRAFTS ANGIOGRAPHY N/A 10/13/2016   Procedure: Left Heart Cath and Cors/Grafts Angiography;  Surgeon: Troy Sine, MD;  Location: [place] [facility];  Service: Cardiovascular;  Laterality: N/A;   LEFT HEART CATH AND CORS/GRAFTS ANGIOGRAPHY N/A 07/13/2018   Procedure: LEFT HEART CATH AND CORS/GRAFTS ANGIOGRAPHY;  Surgeon: Martinique, Peter M, MD;  Location: [place] [facility];  Service: Cardiovascular;  Laterality: N/A;   LEFT HEART CATH AND CORS/GRAFTS ANGIOGRAPHY N/A 07/22/2021   Procedure: LEFT HEART CATH AND CORS/GRAFTS  ANGIOGRAPHY;  Surgeon: Lorretta Harp, MD;  Location: [place] [facility];  Service: Cardiovascular;  Laterality: N/A;   LEFT HEART CATHETERIZATION WITH CORONARY ANGIOGRAM N/A 12/15/2011   Procedure: LEFT HEART CATHETERIZATION WITH CORONARY ANGIOGRAM;  Surgeon: Burnell Blanks, MD;  Location: [facility] CATH LAB;  Service: Cardiovascular;  Laterality: N/A;   LEFT HEART CATHETERIZATION WITH CORONARY ANGIOGRAM N/A 01/10/2012   Procedure: LEFT HEART CATHETERIZATION WITH CORONARY ANGIOGRAM;  Surgeon: Peter M Martinique, MD;  Location: [facility] CATH LAB;  Service: Cardiovascular;  Laterality: N/A;   LEFT HEART CATHETERIZATION WITH CORONARY ANGIOGRAM N/A 06/08/2012   Procedure: LEFT HEART CATHETERIZATION WITH CORONARY ANGIOGRAM;  Surgeon: Burnell Blanks,  MD;  Location: [place] CATH LAB;  Service: Cardiovascular;  Laterality: N/A;   LEFT HEART CATHETERIZATION WITH CORONARY/GRAFT ANGIOGRAM N/A 12/10/2013   Procedure: LEFT HEART CATHETERIZATION WITH Beatrix Fetters;  Surgeon: [name], MD;  Location: [facility] CATH LAB;  Service: Cardiovascular;  Laterality: N/A;   OVARY SURGERY     ovarian cancer   POLYPECTOMY  03/09/2019   Procedure: POLYPECTOMY;  Surgeon: Rogene Houston, MD;  [facility];  Service: Endoscopy;;  cecal    POLYPECTOMY N/A 09/26/2019   Procedure: DUODENAL POLYPECTOMY;  Surgeon: Rogene Houston, MD;  [facility];  Service: Endoscopy;  Laterality: N/A;   REVISION OF ARTERIOVENOUS GORETEX GRAFT N/A 02/24/2017   Procedure: REVISION OF ARTERIOVENOUS GORETEX GRAFT (RESECTION);  Surgeon: Katha Cabal, MD;  Location: [facility];  Service: Vascular;  Laterality: N/A;   REVISON OF ARTERIOVENOUS FISTULA Left 06/19/2020   Procedure: REVISION OF LEFT UPPER ARM AV GRAFT WITH INTERPOSITION JUMP GRAFT USING 6MM GORE LIMB;  Surgeon: Marty Heck, MD;  Location: [place];  Service: Vascular;  Laterality: Left;   SHUNTOGRAM N/A 10/15/2013   Procedure: Fistulogram;  Surgeon:  Serafina Mitchell, MD;  Location: [facility] CATH LAB;  Service: Cardiovascular;  Laterality: N/A;   THROMBECTOMY / ARTERIOVENOUS GRAFT REVISION  2011   left upper arm   TUBAL LIGATION  1980's   UPPER EXTREMITY ANGIOGRAPHY Bilateral 12/06/2016   Procedure: Upper Extremity Angiography;  Surgeon: Katha Cabal, MD;  Location: [place] [facility];  Service: Cardiovascular;  Laterality: Bilateral;   UPPER EXTREMITY INTERVENTION Left 06/06/2017   Procedure: UPPER EXTREMITY INTERVENTION;  Surgeon: Katha Cabal, MD;  Location: [place] [facility];  Service: Cardiovascular;  Laterality: Left;    Current Medications: Outpatient Medications Prior to Visit  Medication Sig Dispense Refill   ALPRAZolam (XANAX) 0.5 MG tablet Take 0.5 mg by mouth at bedtime.     amiodarone (PACERONE) 200 MG tablet Take 1 tablet (200 mg total) by mouth daily. 90 tablet 2   aspirin EC 81 MG tablet Take 81 mg by mouth daily.      hydrOXYzine (VISTARIL) 100 MG capsule Take 100 mg by mouth daily as needed for anxiety.     isosorbide mononitrate (IMDUR) 30 MG 24 hr tablet Take 3 tablets (90 mg total) by mouth 2 (two) times daily. 60 tablet 3   levothyroxine (SYNTHROID) 25 MCG tablet Take 1 tablet (25 mcg total) by mouth daily. 90 tablet 0   lidocaine-prilocaine (EMLA) cream Apply 1 application topically every Monday, Wednesday, and Friday. Prior to dialysis     loperamide (IMODIUM A-D) 2 MG tablet Take 2 tablets (4 mg total) by mouth daily as needed for diarrhea or loose stools.     midodrine (PROAMATINE) 5 MG tablet Take Monday, Wednesday, Friday morning BEFORE dialysis 48 tablet 6   multivitamin (RENA-VIT) TABS tablet Take 1 tablet by mouth daily.     nitroGLYCERIN (NITROSTAT) 0.4 MG SL tablet Place 1 tablet (0.4 mg total) under the tongue every 5 (five) minutes x 3 doses as needed for chest pain (if no relief after 2nd dose, proceed to the ED for an evaluation or call 911). 25 tablet 3   omeprazole (PRILOSEC) 20 MG capsule  Take 1 capsule (20 mg total) by mouth daily. 90 capsule 3   rosuvastatin (CRESTOR) 10 MG tablet Take 1 tablet (10 mg total) by mouth daily. 90 tablet 3   sevelamer carbonate (RENVELA) 800 MG tablet Take 3 tablets (2,400  mg total) by mouth 3 (three) times daily with meals.     metoprolol tartrate (LOPRESSOR) 25 MG tablet Take 0.5 tablets (12.5 mg total) by mouth 2 (two) times daily. 60 tablet 0   No facility-administered medications prior to visit.     Allergies:   Amlodipine, Aspirin, Nitrofurantoin, Penicillins, Bactrim [sulfamethoxazole-trimethoprim], Contrast media [iodinated contrast media], Iron, Tylenol [acetaminophen], Gabapentin, Iron sucrose, Ranexa [ranolazine], Sucroferric oxyhydroxide, Dexilant [dexlansoprazole], Hydralazine, Levaquin [levofloxacin in d5w], Morphine and related, Pantoprazole, Plavix [clopidogrel bisulfate], Protonix [pantoprazole sodium], and Venofer [ferric oxide]   Social History   Socioeconomic History   Marital status: Married    Spouse name: [name]   Number of children: Not on file   Years of education: Not on file   Highest education level: Not on file  Occupational History   Not on file  Tobacco Use   Smoking status: Never   Smokeless tobacco: Never  Vaping Use   Vaping Use: Never used  Substance and Sexual Activity   Alcohol use: No    Alcohol/week: 0.0 standard drinks   Drug use: No   Sexual activity: Yes    Birth control/protection: Surgical  Other Topics Concern   Not on file  Social History Narrative   Lives in [place], [place] with husband.  Dialysis pt - mwf.   Social Determinants of Health   Financial Resource Strain: Not on file  Food Insecurity: Not on file  Transportation Needs: Not on file  Physical Activity: Not on file  Stress: Not on file  Social Connections: Not on file     Family History:  The patient's family history includes Colon cancer (age of onset: 81) in her child; Diabetes in her brother, father, mother, and  sister; Heart attack in her brother, brother, and mother; Heart disease in her father and mother; Hyperlipidemia in her brother, father, and mother; Hypertension in her father, mother, sister, and sister; Other in an other family member.   Review of Systems:    Please see the history of present illness.     All other systems reviewed and are otherwise negative except as noted above.   Physical Exam:    VS:  BP (!) 116/54    Pulse 79    Ht 5\' 1"  (1.549 m)    Wt 126 lb (57.2 kg)    SpO2 95%    BMI 23.81 kg/m    General: Pleasant, elderly female appearing in no acute distress. Head: Normocephalic, atraumatic. Neck: No carotid bruits. JVD not elevated.  Lungs: Respirations regular and unlabored, without wheezes or rales.  Heart: Regular rate and rhythm. No S3 or S4.  2/6 holosystolic murmur along Apex.  Abdomen: Appears non-distended. No obvious abdominal masses. Msk:  Strength and tone appear normal for age. No obvious joint deformities or effusions. Extremities: No clubbing or cyanosis. No pitting edema.  Distal pedal pulses are 2+ bilaterally. Neuro: Alert and oriented X 3. Moves all extremities spontaneously. No focal deficits noted. Psych:  Responds to questions appropriately with a normal affect. Skin: No rashes or lesions noted  Wt Readings from Last 3 Encounters:  07/29/21 126 lb (57.2 kg)  07/23/21 130 lb 15.3 oz (59.4 kg)  07/15/21 119 lb (54 kg)     Studies/Labs Reviewed:   EKG:  EKG is not ordered today.    Recent Labs: 09/29/2020: B Natriuretic Peptide 161.0 04/09/2021: TSH 4.509 07/06/2021: ALT 9 07/23/2021: BUN 52; Creatinine, Ser 7.83; Hemoglobin 9.5; Magnesium 1.9; Platelets PLATELET CLUMPS NOTED ON SMEAR, UNABLE TO  ESTIMATE; Potassium 4.5; Sodium 130   Lipid Panel    Component Value Date/Time   CHOL 131 07/16/2021 0151   TRIG 90 07/16/2021 0151   HDL 60 07/16/2021 0151   CHOLHDL 2.2 07/16/2021 0151   VLDL 18 07/16/2021 0151   LDLCALC 53 07/16/2021 0151     Additional studies/ records that were reviewed today include:   Echocardiogram: 07/2021 IMPRESSIONS     1. Left ventricular ejection fraction, by estimation, is 30 to 35%. The  left ventricle has moderately decreased function. The left ventricle  demonstrates regional wall motion abnormalities (see scoring  diagram/findings for description). There is mild  left ventricular hypertrophy. Left ventricular diastolic parameters are  consistent with Grade II diastolic dysfunction (pseudonormalization).  Elevated left atrial pressure.   2. Right ventricular systolic function is moderately reduced. The right  ventricular size is normal. There is mildly elevated pulmonary artery  systolic pressure. The estimated right ventricular systolic pressure is  26.9 mmHg.   3. Left atrial size was severely dilated.   4. Right atrial size was mildly dilated.   5. The mitral valve is degenerative. Moderate mitral valve regurgitation.  Mild to moderate mitral stenosis. MG 42mmHg at 70bpm, MVA 1.2 cm^2 by  continuity equation. Moderate mitral annular calcification.   6. Tricuspid valve regurgitation is mild to moderate.   7. The aortic valve is tricuspid. Aortic valve regurgitation is not  visualized. Aortic valve sclerosis/calcification is present, without any  evidence of aortic stenosis.   8. The inferior vena cava is normal in size with greater than 50%  respiratory variability, suggesting right atrial pressure of 3 mmHg.    LHC: 07/2021 Mid LM to Dist LM lesion is 60% stenosed.   Prox LAD lesion is 90% stenosed.   Ost Cx to Mid Cx lesion is 99% stenosed.   Ost RCA to Prox RCA lesion is 100% stenosed.   Ost Ramus to Ramus lesion is 80% stenosed.   Ost 1st Diag to 1st Diag lesion is 100% stenosed.   Ost RPDA to RPDA lesion is 75% stenosed.   2nd Mrg lesion is 60% stenosed.   Dist LAD lesion is 70% stenosed.   LIMA and is large.   SVG and is large.   SVG and is normal in caliber.   SVG and  is large.   The graft exhibits no disease.   The graft exhibits no disease.   The graft exhibits no disease.   LV end diastolic pressure is mildly elevated.   The left ventricular ejection fraction is 35-45% by visual estimate.   IMPRESSION: Ms. Hascall anatomy is for all intents and purposes unchanged from her prior cath performed by Dr. Martinique in 2020.  All her grafts are patent.  Her LVEDP was 18.  Her EF looks to be in the 40 to 45% range with an anteroapical wall motion abnormality.  I suspect that her chest pain and mild enzyme leak was related to "demand ischemia".  Medical therapy will be recommended.  If femoral angiogram was performed and the stick appeared to be slightly high and therefore Mynx closure was not performed.  The patient will need a manual hold in the recovery bay prior to going to the floor.  Dr. Domenic Polite, the patient's attending cardiologist, was notified of these results.   Assessment:    1. Coronary artery disease of native artery of native heart with stable angina pectoris (HCC)   2. HFrEF (heart failure with reduced ejection fraction) ([place])  3. AF (paroxysmal atrial fibrillation) ([place])   4. Anemia due to chronic kidney disease, on chronic dialysis ([place])   5. Hyperlipidemia LDL goal <70   6. ESRD on hemodialysis ([place])      Plan:   In order of problems listed above:  1. CAD - She is s/p CABG in 2013, DES to D1 in 10/2016 and cath in 07/2018 showing patent grafts with occlusion of D1 at prior stent site and progression of PDA disease with medical management recommended. Recent catheterization this month showed patent grafts with known PDA disease and overall similar to her prior cath in 07/2018. - Will continue her current cardiac medications with ASA 81mg  daily, Imdur 90mg  BID and Crestor 10mg  daily. Will adjust Lopressor to Toprol-XL but have her hold it on the days of HD due to hypotension with this.  2. HFrEF - Her EF was previously at 50% in 03/2021 but  reduced at 30-35% by repeat echo earlier this month. Her volume status is managed by HD. Will transition Lopressor to Toprol-XL as discussed during her admission but she will hold it the days of HD due to hypotension with HD. Continue Imdur 90mg  BID. BP does not allow for adding Hydralazine at this time given her intermittent hypotension. No ACE-I/ARB/ARNI/Spiro/SGLT2 inhibitor given her CKD.   3. Paroxysmal atrial fibrillation - She does continue to experience intermittent palpitations but no persistent symptoms. Will transition to Toprol-XL and continue Amiodarone 200mg  daily. She has not been on anticoagulation given history of GIB and due to her chronic anemia.   4. Anemia - She did require a transfusion during her recent admission but says her Hgb has been stable with checked at HD. No reports of active bleeding.   5. HLD - She was transitioned from Simvastatin to Crestor during her admission and I have asked her to make sure she is taking this. LDL was at 48 on 07/16/2021.  6. ESRD - On HD - MWF schedule.    Medication Adjustments/Labs and Tests Ordered: Current medicines are reviewed at length with the patient today.  Concerns regarding medicines are outlined above.  Medication changes, Labs and Tests ordered today are listed in the Patient Instructions below. Patient Instructions  Medication Instructions:   Stop Taking Lopressor  Start Taking Toprol XL 25 mg Daily Take on the days you do not have Dialysis  Make sure that you take your Crestor 10 mg Daily   *If you need a refill on your cardiac medications before your next appointment, please call your pharmacy*   If you have labs (blood work) drawn today and your tests are completely normal, you will receive your results only by: MyChart Message (if you have MyChart) OR A paper copy in the mail If you have any lab test that is abnormal or we need to change your treatment, we will call you to review the  results.   Follow-Up: At [facility], you and your health needs are our priority.  As part of our continuing mission to provide you with exceptional heart care, we have created designated Provider Care Teams.  These Care Teams include your primary Cardiologist (physician) and Advanced Practice Providers (APPs -  Physician Assistants and Nurse Practitioners) who all work together to provide you with the care you need, when you need it.  We recommend signing up for the patient portal called "MyChart".  Sign up information is provided on this After Visit Summary.  MyChart is used to connect with patients for Virtual Visits (  Telemedicine).  Patients are able to view lab/test results, encounter notes, upcoming appointments, etc.  Non-urgent messages can be sent to your provider as well.   To learn more about what you can do with MyChart, go to [email or web address].    Your next appointment:    April   The format for your next appointment:   In Person  Provider:   Carlyle Dolly, MD    Other Instructions Thank you for choosing [facility]!      Signed, Erma Heritage, PA-C  07/29/2021 8:26 PM    [facility] [address] [place], [place] [address] Phone: [phone] Fax: [phone]

## 2021-07-29 NOTE — Patient Instructions (Signed)
Medication Instructions:   Stop Taking Lopressor  Start Taking Toprol XL 25 mg Daily Take on the days you do not have Dialysis  Make sure that you take your Crestor 10 mg Daily   *If you need a refill on your cardiac medications before your next appointment, please call your pharmacy*   If you have labs (blood work) drawn today and your tests are completely normal, you will receive your results only by: MyChart Message (if you have MyChart) OR A paper copy in the mail If you have any lab test that is abnormal or we need to change your treatment, we will call you to review the results.   Follow-Up: At [facility], you and your health needs are our priority.  As part of our continuing mission to provide you with exceptional heart care, we have created designated Provider Care Teams.  These Care Teams include your primary Cardiologist (physician) and Advanced Practice Providers (APPs -  Physician Assistants and Nurse Practitioners) who all work together to provide you with the care you need, when you need it.  We recommend signing up for the patient portal called "MyChart".  Sign up information is provided on this After Visit Summary.  MyChart is used to connect with patients for Virtual Visits (Telemedicine).  Patients are able to view lab/test results, encounter notes, upcoming appointments, etc.  Non-urgent messages can be sent to your provider as well.   To learn more about what you can do with MyChart, go to [email or web address].    Your next appointment:    April   The format for your next appointment:   In Person  Provider:   Carlyle Dolly, MD    Other Instructions Thank you for choosing [facility]!

## 2021-08-05 NOTE — Telephone Encounter (Signed)
error 

## 2021-08-10 ENCOUNTER — Ambulatory Visit: Payer: Medicare HMO | Admitting: Nurse Practitioner

## 2021-08-10 NOTE — Patient Instructions (Incomplete)
-   The correct intake of thyroid hormone (Levothyroxine, Synthroid), is on empty stomach first thing in the morning, with water, separated by at least 30 minutes from breakfast and other medications,  and separated by more than 4 hours from calcium, iron, multivitamins, acid reflux medications (PPIs).  - This medication is a life-long medication and will be needed to correct thyroid hormone imbalances for the rest of your life.  The dose may change from time to time, based on thyroid blood work.  - It is extremely important to be consistent taking this medication, near the same time each morning.  -AVOID TAKING PRODUCTS CONTAINING BIOTIN (commonly found in Hair, Skin, Nails vitamins) AS IT INTERFERES WITH THE VALIDITY OF THYROID FUNCTION BLOOD TESTS.  

## 2021-08-15 ENCOUNTER — Inpatient Hospital Stay (HOSPITAL_COMMUNITY)
Admission: EM | Admit: 2021-08-15 | Discharge: 2021-08-20 | DRG: 308 | Disposition: A | Discharge status: Home / Self Care | Payer: Medicare HMO | Attending: Family Medicine | Admitting: Family Medicine

## 2021-08-15 ENCOUNTER — Emergency Department (HOSPITAL_COMMUNITY): Payer: Medicare HMO

## 2021-08-15 ENCOUNTER — Other Ambulatory Visit: Payer: Self-pay

## 2021-08-15 ENCOUNTER — Encounter (HOSPITAL_COMMUNITY): Payer: Self-pay | Admitting: Pharmacy Technician

## 2021-08-15 DIAGNOSIS — R000 Tachycardia, unspecified: Secondary | ICD-10-CM | POA: Diagnosis not present

## 2021-08-15 DIAGNOSIS — Z8249 Family history of ischemic heart disease and other diseases of the circulatory system: Secondary | ICD-10-CM

## 2021-08-15 DIAGNOSIS — Z8673 Personal history of transient ischemic attack (TIA), and cerebral infarction without residual deficits: Secondary | ICD-10-CM

## 2021-08-15 DIAGNOSIS — I132 Hypertensive heart and chronic kidney disease with heart failure and with stage 5 chronic kidney disease, or end stage renal disease: Secondary | ICD-10-CM | POA: Diagnosis present

## 2021-08-15 DIAGNOSIS — M109 Gout, unspecified: Secondary | ICD-10-CM | POA: Diagnosis present

## 2021-08-15 DIAGNOSIS — K219 Gastro-esophageal reflux disease without esophagitis: Secondary | ICD-10-CM | POA: Diagnosis present

## 2021-08-15 DIAGNOSIS — R079 Chest pain, unspecified: Secondary | ICD-10-CM | POA: Diagnosis not present

## 2021-08-15 DIAGNOSIS — D631 Anemia in chronic kidney disease: Secondary | ICD-10-CM | POA: Diagnosis present

## 2021-08-15 DIAGNOSIS — Z90710 Acquired absence of both cervix and uterus: Secondary | ICD-10-CM

## 2021-08-15 DIAGNOSIS — I4891 Unspecified atrial fibrillation: Secondary | ICD-10-CM | POA: Diagnosis not present

## 2021-08-15 DIAGNOSIS — Z8701 Personal history of pneumonia (recurrent): Secondary | ICD-10-CM

## 2021-08-15 DIAGNOSIS — I5042 Chronic combined systolic (congestive) and diastolic (congestive) heart failure: Secondary | ICD-10-CM | POA: Diagnosis present

## 2021-08-15 DIAGNOSIS — N186 End stage renal disease: Secondary | ICD-10-CM | POA: Diagnosis not present

## 2021-08-15 DIAGNOSIS — Z880 Allergy status to penicillin: Secondary | ICD-10-CM

## 2021-08-15 DIAGNOSIS — I480 Paroxysmal atrial fibrillation: Principal | ICD-10-CM | POA: Diagnosis present

## 2021-08-15 DIAGNOSIS — I5032 Chronic diastolic (congestive) heart failure: Secondary | ICD-10-CM

## 2021-08-15 DIAGNOSIS — Z9851 Tubal ligation status: Secondary | ICD-10-CM

## 2021-08-15 DIAGNOSIS — M1990 Unspecified osteoarthritis, unspecified site: Secondary | ICD-10-CM | POA: Diagnosis present

## 2021-08-15 DIAGNOSIS — I4892 Unspecified atrial flutter: Secondary | ICD-10-CM | POA: Diagnosis present

## 2021-08-15 DIAGNOSIS — I5022 Chronic systolic (congestive) heart failure: Secondary | ICD-10-CM | POA: Diagnosis present

## 2021-08-15 DIAGNOSIS — Z881 Allergy status to other antibiotic agents status: Secondary | ICD-10-CM

## 2021-08-15 DIAGNOSIS — I779 Disorder of arteries and arterioles, unspecified: Secondary | ICD-10-CM | POA: Diagnosis present

## 2021-08-15 DIAGNOSIS — E782 Mixed hyperlipidemia: Secondary | ICD-10-CM | POA: Diagnosis present

## 2021-08-15 DIAGNOSIS — Z886 Allergy status to analgesic agent status: Secondary | ICD-10-CM

## 2021-08-15 DIAGNOSIS — Z8711 Personal history of peptic ulcer disease: Secondary | ICD-10-CM

## 2021-08-15 DIAGNOSIS — Z992 Dependence on renal dialysis: Secondary | ICD-10-CM

## 2021-08-15 DIAGNOSIS — Z951 Presence of aortocoronary bypass graft: Secondary | ICD-10-CM

## 2021-08-15 DIAGNOSIS — I482 Chronic atrial fibrillation, unspecified: Secondary | ICD-10-CM | POA: Diagnosis present

## 2021-08-15 DIAGNOSIS — J42 Unspecified chronic bronchitis: Secondary | ICD-10-CM | POA: Diagnosis present

## 2021-08-15 DIAGNOSIS — E876 Hypokalemia: Secondary | ICD-10-CM | POA: Diagnosis present

## 2021-08-15 DIAGNOSIS — Z8543 Personal history of malignant neoplasm of ovary: Secondary | ICD-10-CM

## 2021-08-15 DIAGNOSIS — Z83438 Family history of other disorder of lipoprotein metabolism and other lipidemia: Secondary | ICD-10-CM

## 2021-08-15 DIAGNOSIS — I959 Hypotension, unspecified: Secondary | ICD-10-CM | POA: Diagnosis present

## 2021-08-15 DIAGNOSIS — E785 Hyperlipidemia, unspecified: Secondary | ICD-10-CM | POA: Diagnosis present

## 2021-08-15 DIAGNOSIS — F419 Anxiety disorder, unspecified: Secondary | ICD-10-CM | POA: Diagnosis present

## 2021-08-15 DIAGNOSIS — Z955 Presence of coronary angioplasty implant and graft: Secondary | ICD-10-CM

## 2021-08-15 DIAGNOSIS — Z888 Allergy status to other drugs, medicaments and biological substances status: Secondary | ICD-10-CM

## 2021-08-15 DIAGNOSIS — E1122 Type 2 diabetes mellitus with diabetic chronic kidney disease: Secondary | ICD-10-CM | POA: Diagnosis present

## 2021-08-15 DIAGNOSIS — I2582 Chronic total occlusion of coronary artery: Secondary | ICD-10-CM | POA: Diagnosis present

## 2021-08-15 DIAGNOSIS — Z85038 Personal history of other malignant neoplasm of large intestine: Secondary | ICD-10-CM

## 2021-08-15 DIAGNOSIS — E7800 Pure hypercholesterolemia, unspecified: Secondary | ICD-10-CM | POA: Diagnosis present

## 2021-08-15 DIAGNOSIS — I252 Old myocardial infarction: Secondary | ICD-10-CM

## 2021-08-15 DIAGNOSIS — Z7982 Long term (current) use of aspirin: Secondary | ICD-10-CM

## 2021-08-15 DIAGNOSIS — I2510 Atherosclerotic heart disease of native coronary artery without angina pectoris: Secondary | ICD-10-CM | POA: Diagnosis present

## 2021-08-15 DIAGNOSIS — Z79899 Other long term (current) drug therapy: Secondary | ICD-10-CM

## 2021-08-15 DIAGNOSIS — Z8674 Personal history of sudden cardiac arrest: Secondary | ICD-10-CM

## 2021-08-15 DIAGNOSIS — Z7189 Other specified counseling: Secondary | ICD-10-CM

## 2021-08-15 DIAGNOSIS — Z79890 Hormone replacement therapy (postmenopausal): Secondary | ICD-10-CM

## 2021-08-15 DIAGNOSIS — Z91041 Radiographic dye allergy status: Secondary | ICD-10-CM

## 2021-08-15 DIAGNOSIS — Z885 Allergy status to narcotic agent status: Secondary | ICD-10-CM

## 2021-08-15 DIAGNOSIS — I10 Essential (primary) hypertension: Secondary | ICD-10-CM | POA: Diagnosis present

## 2021-08-15 DIAGNOSIS — Z20822 Contact with and (suspected) exposure to covid-19: Secondary | ICD-10-CM | POA: Diagnosis present

## 2021-08-15 DIAGNOSIS — Z800 Family history of malignant neoplasm of digestive organs: Secondary | ICD-10-CM

## 2021-08-15 DIAGNOSIS — I248 Other forms of acute ischemic heart disease: Secondary | ICD-10-CM | POA: Diagnosis present

## 2021-08-15 DIAGNOSIS — Z833 Family history of diabetes mellitus: Secondary | ICD-10-CM

## 2021-08-15 LAB — TROPONIN I (HIGH SENSITIVITY)
Troponin I (High Sensitivity): 27 ng/L — ABNORMAL HIGH (ref <18)
Troponin I (High Sensitivity): 27 ng/L — ABNORMAL HIGH (ref <18)

## 2021-08-15 LAB — DIFFERENTIAL
Basophils Absolute: 0 10*3/uL (ref 0.0–0.1)
Basophils Relative: 0 %
Eosinophils Absolute: 0.2 10*3/uL (ref 0.0–0.5)
Eosinophils Relative: 3 %
Lymphocytes Relative: 19 %
Lymphs Abs: 1 10*3/uL (ref 0.7–4.0)
Monocytes Absolute: 0.3 10*3/uL (ref 0.1–1.0)
Monocytes Relative: 5 %
Neutro Abs: 4 10*3/uL (ref 1.7–7.7)
Neutrophils Relative %: 73 %
Smear Review: UNDETERMINED

## 2021-08-15 LAB — CBC
HCT: 31.2 % — ABNORMAL LOW (ref 36.0–46.0)
Hemoglobin: 9.7 g/dL — ABNORMAL LOW (ref 12.0–15.0)
MCH: 36.5 pg — ABNORMAL HIGH (ref 26.0–34.0)
MCHC: 31.1 g/dL (ref 30.0–36.0)
MCV: 117.3 fL — ABNORMAL HIGH (ref 80.0–100.0)
RBC: 2.66 MIL/uL — ABNORMAL LOW (ref 3.87–5.11)
RDW: 19.8 % — ABNORMAL HIGH (ref 11.5–15.5)
WBC: 5.5 10*3/uL (ref 4.0–10.5)
nRBC: 0 % (ref 0.0–0.2)

## 2021-08-15 LAB — RESP PANEL BY RT-PCR (FLU A&B, COVID) ARPGX2
Influenza A by PCR: NEGATIVE
Influenza B by PCR: NEGATIVE
SARS Coronavirus 2 by RT PCR: NEGATIVE

## 2021-08-15 LAB — BRAIN NATRIURETIC PEPTIDE: B Natriuretic Peptide: 1051 pg/mL — ABNORMAL HIGH (ref 0.0–100.0)

## 2021-08-15 LAB — BASIC METABOLIC PANEL
Anion gap: 11 (ref 5–15)
BUN: 42 mg/dL — ABNORMAL HIGH (ref 8–23)
CO2: 27 mmol/L (ref 22–32)
Calcium: 9.3 mg/dL (ref 8.9–10.3)
Chloride: 98 mmol/L (ref 98–111)
Creatinine, Ser: 5.25 mg/dL — ABNORMAL HIGH (ref 0.44–1.00)
GFR, Estimated: 8 mL/min — ABNORMAL LOW (ref >60)
Glucose, Bld: 173 mg/dL — ABNORMAL HIGH (ref 70–99)
Potassium: 3.3 mmol/L — ABNORMAL LOW (ref 3.5–5.1)
Sodium: 136 mmol/L (ref 135–145)

## 2021-08-15 LAB — MAGNESIUM: Magnesium: 1.9 mg/dL (ref 1.7–2.4)

## 2021-08-15 IMAGING — DX DG CHEST 1V PORT
1 series · 1 of 1 positions shown · non-contrast
Comparison: Chest x-rays dated [DATE] and [DATE].

CLINICAL DATA: Chest pain

EXAM:
PORTABLE CHEST 1 VIEW

[chest ap]
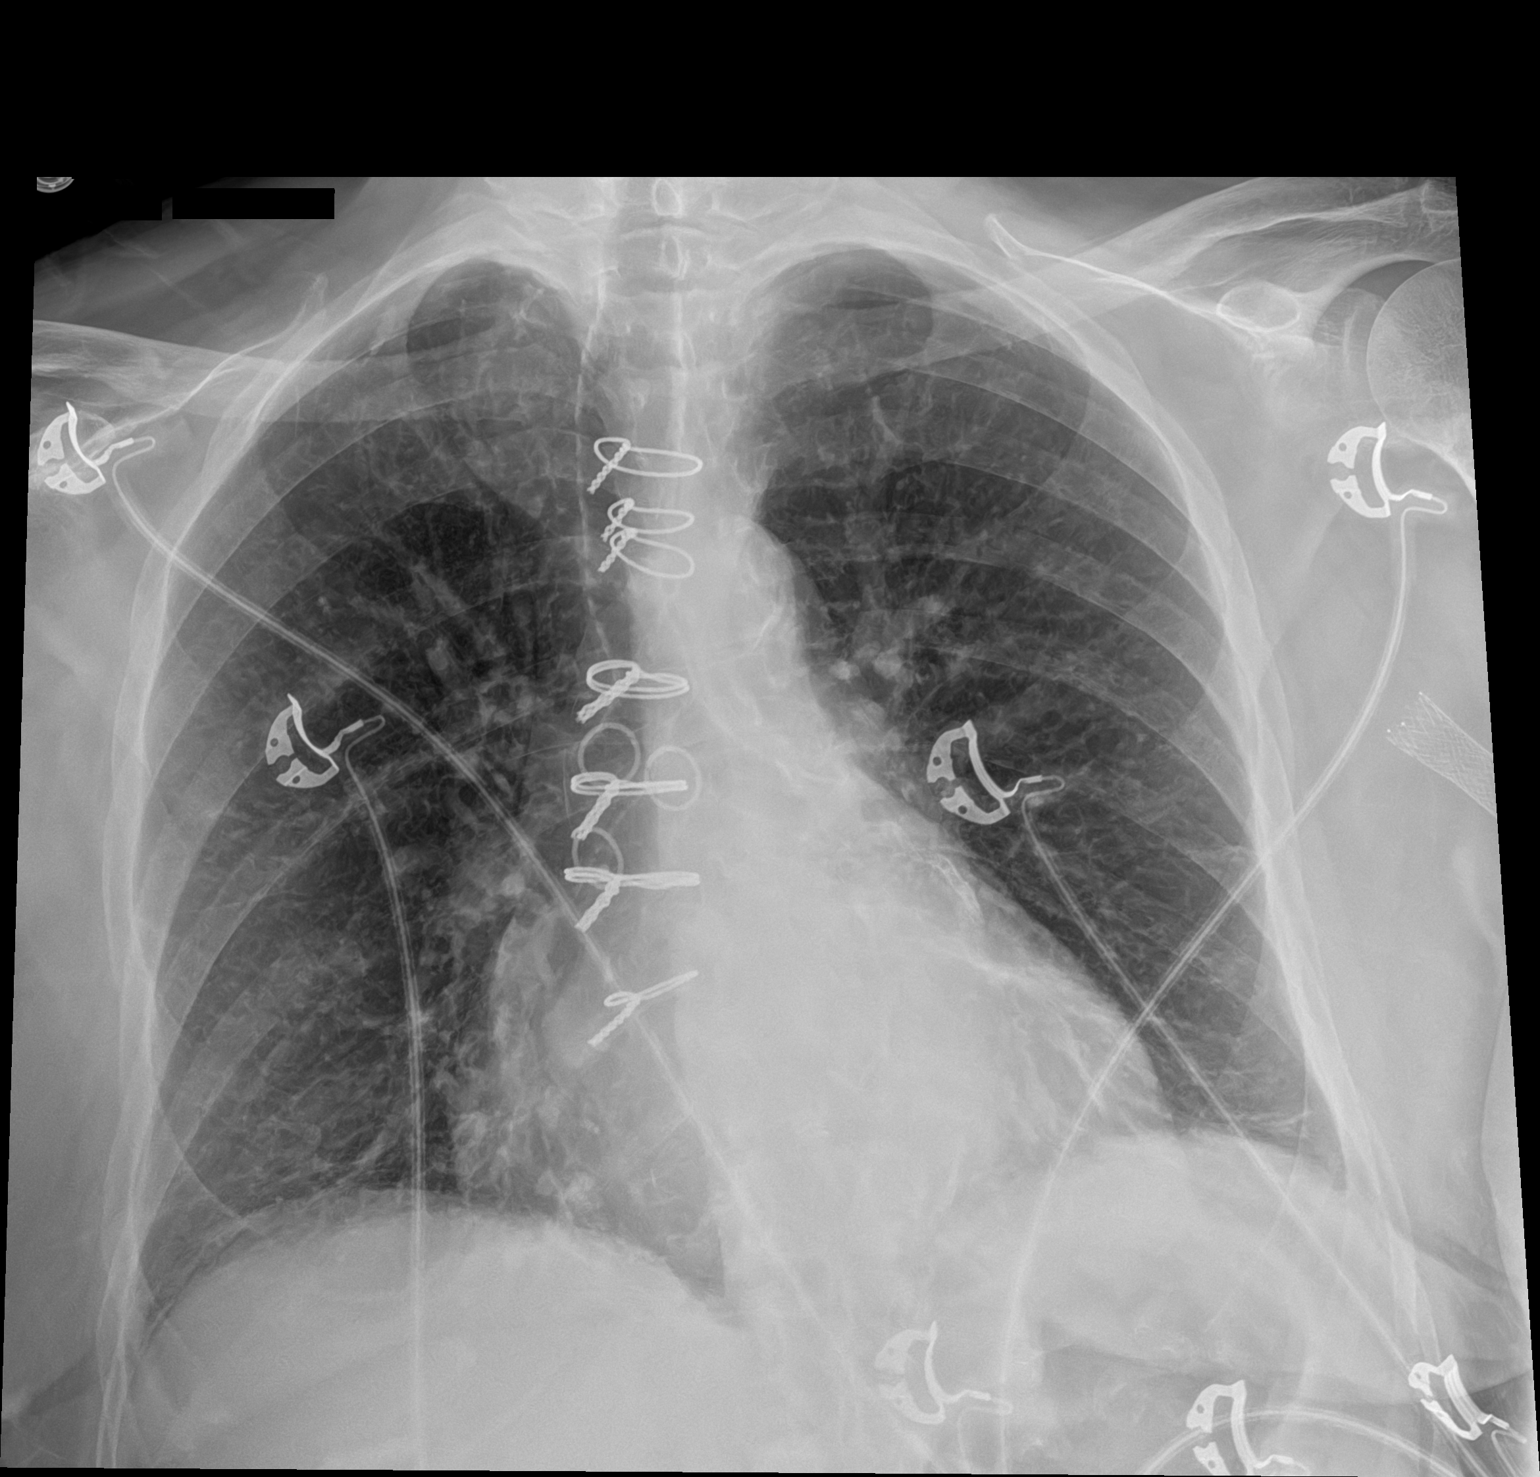

[1 of 1 positions shown; findings below may reference images not displayed]

FINDINGS: Stable cardiomegaly. Median sternotomy wires appear intact and
stable in alignment. Coarse lung markings bilaterally. No confluent
opacity to suggest a superimposed pneumonia or pulmonary edema. No
pleural effusion or pneumothorax is seen. No acute-appearing osseous
abnormality.
IMPRESSION: 1. No active disease. No evidence of pneumonia or pulmonary edema.
2. Stable cardiomegaly.
3. Coarse lung markings bilaterally, suspected chronic interstitial
lung disease.

## 2021-08-15 MED ORDER — AMIODARONE HCL 200 MG PO TABS: 200.0000 mg | ORAL_TABLET | Freq: Every day | ORAL | Status: DC

## 2021-08-15 MED ORDER — POLYETHYLENE GLYCOL 3350 17 G PO PACK: 17.0000 g | PACK | Freq: Every day | ORAL | Status: DC | PRN

## 2021-08-15 MED ORDER — ASPIRIN 325 MG PO TABS
325.0000 mg | ORAL_TABLET | Freq: Once | ORAL | Status: AC
  Administered 2021-08-15: 325 mg via ORAL
  Filled 2021-08-15: qty 1

## 2021-08-15 MED ORDER — HEPARIN SODIUM (PORCINE) 5000 UNIT/ML IJ SOLN
5000.0000 [IU] | Freq: Three times a day (TID) | INTRAMUSCULAR | Status: DC
  Administered 2021-08-16: 5000 [IU] via SUBCUTANEOUS
  Filled 2021-08-15: qty 1

## 2021-08-15 MED ORDER — LEVOTHYROXINE SODIUM 25 MCG PO TABS
25.0000 ug | ORAL_TABLET | Freq: Every day | ORAL | Status: DC
  Administered 2021-08-16 – 2021-08-20 (×5): 25 ug via ORAL
  Filled 2021-08-15 (×5): qty 1

## 2021-08-15 MED ORDER — ASPIRIN EC 81 MG PO TBEC
81.0000 mg | DELAYED_RELEASE_TABLET | Freq: Every day | ORAL | Status: DC
  Administered 2021-08-16 – 2021-08-19 (×4): 81 mg via ORAL
  Filled 2021-08-15 (×5): qty 1

## 2021-08-15 MED ORDER — ACETAMINOPHEN 325 MG PO TABS: 650.0000 mg | ORAL_TABLET | Freq: Four times a day (QID) | ORAL | Status: DC | PRN

## 2021-08-15 MED ORDER — ROSUVASTATIN CALCIUM 10 MG PO TABS
10.0000 mg | ORAL_TABLET | Freq: Every day | ORAL | Status: DC
  Administered 2021-08-16 – 2021-08-19 (×4): 10 mg via ORAL
  Filled 2021-08-15 (×5): qty 1

## 2021-08-15 MED ORDER — MIDODRINE HCL 5 MG PO TABS: 10.0000 mg | ORAL_TABLET | Freq: Two times a day (BID) | ORAL | Status: DC

## 2021-08-15 MED ORDER — ACETAMINOPHEN 650 MG RE SUPP: 650.0000 mg | Freq: Four times a day (QID) | RECTAL | Status: DC | PRN

## 2021-08-15 MED ORDER — SEVELAMER CARBONATE 800 MG PO TABS
2400.0000 mg | ORAL_TABLET | Freq: Three times a day (TID) | ORAL | Status: DC
  Administered 2021-08-16 – 2021-08-20 (×12): 2400 mg via ORAL
  Filled 2021-08-15 (×12): qty 3

## 2021-08-15 NOTE — ED Notes (Signed)
Report given. Patient is ready for transport.  ?

## 2021-08-15 NOTE — ED Triage Notes (Signed)
Pt here POV with reports of chest pain onset this morning. Pt MWF dialysis pt. States the last several times they have not done a full tx due to hypotension. States yesterday they only took off 1L. Denies shob.

## 2021-08-15 NOTE — ED Provider Notes (Signed)
[facility] EMERGENCY DEPARTMENT Provider Note   CSN: [id number] Arrival date & time: 08/15/21  1134     History  Chief Complaint  Patient presents with   Chest Pain    Shawna [name] is a 81 y.o. female.   Chest Pain Patient presenting for chest pain.  Onset was this morning.  She has ESRD and gets dialysis on M, W, and F.  On Friday, she was unable to have any fluid pulled off due to hypotension.  This has been an ongoing issue and she has been recently prescribed midodrine to help with blood pressure.  She underwent an additional dialysis session yesterday.  During this time, 1 L of fluid was removed.  She has had ongoing hypotension.  Per chart review, patient currently takes Imdur and Toprol.  Patient is unaware of which medications she takes but was not aware that she is currently taking antihypertensives.  She has not been short of breath recently.  Other medical history is notable for CAD s/p CABG in 2013 DES in 2018.  She was admitted to the hospital 1 month ago for NSTEMI and underwent a heart cath.  Medical management was recommended at that time.  She underwent an echocardiogram which showed EF of 30 to 35% with areas of akinesis.  Additional medical history includes HFrEF, HTN, HLD, paroxysmal atrial fibrillation (not on anticoagulation due to history of GI bleed), chronic anemia.  She was last seen at cardiology office on 1/19.  Currently, chest pain is minimal.  She has noticed some nonpainful abdominal swelling since yesterday.  She denies any other recent symptoms.  She is anuric at baseline.  She has been having frequent bowel movements.    Home Medications Prior to Admission medications   Medication Sig Start Date End Date Taking? Authorizing Provider  ALPRAZolam Duanne Moron) 0.5 MG tablet Take 0.5 mg by mouth at bedtime. 03/20/21   [provider]  amiodarone (PACERONE) 200 MG tablet Take 1 tablet (200 mg total) by mouth daily. 05/13/21   Strader, Fransisco Hertz, PA-C   aspirin EC 81 MG tablet Take 81 mg by mouth daily.  09/27/19   Rehman, Mechele Dawley, MD  hydrOXYzine (VISTARIL) 100 MG capsule Take 100 mg by mouth daily as needed for anxiety. 11/23/20   [provider]  isosorbide mononitrate (IMDUR) 30 MG 24 hr tablet Take 3 tablets (90 mg total) by mouth 2 (two) times daily. 07/24/21   Hosie Poisson, MD  levothyroxine (SYNTHROID) 25 MCG tablet Take 1 tablet (25 mcg total) by mouth daily. 02/02/21   Brita Romp, NP  lidocaine-prilocaine (EMLA) cream Apply 1 application topically every Monday, Wednesday, and Friday. Prior to dialysis    [provider]  loperamide (IMODIUM A-D) 2 MG tablet Take 2 tablets (4 mg total) by mouth daily as needed for diarrhea or loose stools. 12/17/20   Thurnell Lose, MD  metoprolol succinate (TOPROL XL) 25 MG 24 hr tablet Take 1 tablet (25 mg total) by mouth daily. 07/29/21   Strader, Fransisco Hertz, PA-C  midodrine (PROAMATINE) 5 MG tablet Take Monday, Wednesday, Friday morning BEFORE dialysis 12/22/20   Arnoldo Lenis, MD  multivitamin (RENA-VIT) TABS tablet Take 1 tablet by mouth daily.    [provider]  nitroGLYCERIN (NITROSTAT) 0.4 MG SL tablet Place 1 tablet (0.4 mg total) under the tongue every 5 (five) minutes x 3 doses as needed for chest pain (if no relief after 2nd dose, proceed to the ED for an evaluation  or call 911). 02/19/21   Verta Ellen., NP  omeprazole (PRILOSEC) 20 MG capsule Take 1 capsule (20 mg total) by mouth daily. 12/01/20   Rehman, Mechele Dawley, MD  rosuvastatin (CRESTOR) 10 MG tablet Take 1 tablet (10 mg total) by mouth daily. 05/13/21   Strader, Fransisco Hertz, PA-C  sevelamer carbonate (RENVELA) 800 MG tablet Take 3 tablets (2,400 mg total) by mouth 3 (three) times daily with meals. 07/08/21   Shelly Coss, MD      Allergies    Amlodipine, Aspirin, Nitrofurantoin, Penicillins, Bactrim [sulfamethoxazole-trimethoprim], Contrast media [iodinated contrast media], Iron, Tylenol  [acetaminophen], Gabapentin, Iron sucrose, Ranexa [ranolazine], Sucroferric oxyhydroxide, Dexilant [dexlansoprazole], Hydralazine, Levaquin [levofloxacin in d5w], Morphine and related, Pantoprazole, Plavix [clopidogrel bisulfate], Protonix [pantoprazole sodium], and Venofer [ferric oxide]    Review of Systems   Review of Systems  Cardiovascular:  Positive for chest pain.  Gastrointestinal:  Positive for abdominal distention.  All other systems reviewed and are negative.  Physical Exam Updated Vital Signs BP (!) 118/57    Pulse (!) 114    Temp (P) 98 F (36.7 C) (Oral)    Resp 17    Ht 5\' 1"  (1.549 m)    Wt 58.5 kg    SpO2 98%    BMI 24.37 kg/m  Physical Exam Vitals and nursing note reviewed.  Constitutional:      General: She is not in acute distress.    Appearance: She is well-developed and normal weight. She is not ill-appearing, toxic-appearing or diaphoretic.  HENT:     Head: Normocephalic and atraumatic.  Eyes:     Conjunctiva/sclera: Conjunctivae normal.  Cardiovascular:     Rate and Rhythm: Regular rhythm. Tachycardia present.     Heart sounds: No murmur heard. Pulmonary:     Effort: Pulmonary effort is normal. No respiratory distress.     Breath sounds: Normal breath sounds. No wheezing, rhonchi or rales.  Chest:     Chest wall: No tenderness or edema.  Abdominal:     Palpations: Abdomen is soft.     Tenderness: There is no abdominal tenderness.  Musculoskeletal:        General: No swelling.     Cervical back: Normal range of motion and neck supple.     Right lower leg: No edema.     Left lower leg: No edema.  Skin:    General: Skin is warm and dry.     Capillary Refill: Capillary refill takes less than 2 seconds.     Coloration: Skin is not cyanotic or pale.  Neurological:     General: No focal deficit present.     Mental Status: She is alert and oriented to person, place, and time.     Cranial Nerves: No cranial nerve deficit.     Motor: No weakness.   Psychiatric:        Mood and Affect: Mood normal.        Behavior: Behavior normal.    ED Results / Procedures / Treatments   Labs (all labs ordered are listed, but only abnormal results are displayed) Labs Reviewed  BASIC METABOLIC PANEL - Abnormal; Notable for the following components:      Result Value   Potassium 3.3 (*)    Glucose, Bld 173 (*)    BUN 42 (*)    Creatinine, Ser 5.25 (*)    GFR, Estimated 8 (*)    All other components within normal limits  CBC - Abnormal; Notable for the following  components:   RBC 2.66 (*)    Hemoglobin 9.7 (*)    HCT 31.2 (*)    MCV 117.3 (*)    MCH 36.5 (*)    RDW 19.8 (*)    All other components within normal limits  TROPONIN I (HIGH SENSITIVITY) - Abnormal; Notable for the following components:   Troponin I (High Sensitivity) 27 (*)    All other components within normal limits  TROPONIN I (HIGH SENSITIVITY) - Abnormal; Notable for the following components:   Troponin I (High Sensitivity) 27 (*)    All other components within normal limits  RESP PANEL BY RT-PCR (FLU A&B, COVID) [id number]  DIFFERENTIAL    EKG EKG Interpretation  Date/Time:  Sunday August 15 2021 11:48:10 EST Ventricular Rate:  114 PR Interval:  76 QRS Duration: 146 QT Interval:  408 QTC Calculation: 562 R Axis:   12 Text Interpretation: Sinus or ectopic atrial tachycardia Ventricular premature complex Left bundle branch block Baseline wander in lead(s) II III aVR aVF Confirmed by Godfrey Pick [phone]) on 08/15/2021 12:04:32 PM  Radiology DG Chest Port 1 View  Result Date: 08/15/2021 CLINICAL DATA:  Chest pain EXAM: PORTABLE CHEST 1 VIEW COMPARISON:  Chest x-rays dated 07/17/2021 and 03/29/2017. FINDINGS: Stable cardiomegaly. Median sternotomy wires appear intact and stable in alignment. Coarse lung markings bilaterally. No confluent opacity to suggest a superimposed pneumonia or pulmonary edema. No pleural effusion or pneumothorax is seen. No acute-appearing osseous  abnormality. IMPRESSION: 1. No active disease. No evidence of pneumonia or pulmonary edema. 2. Stable cardiomegaly. 3. Coarse lung markings bilaterally, suspected chronic interstitial lung disease. Electronically Signed   By: Franki Cabot M.D.   On: 08/15/2021 12:22    Procedures Procedures    Medications Ordered in ED Medications - No data to display  ED Course/ Medical Decision Making/ A&P                           Medical Decision Making Risk Decision regarding hospitalization.   This patient presents to the ED for concern of chest pain, this involves an extensive number of treatment options, and is a complaint that carries with it a high risk of complications and morbidity.  The differential diagnosis includes ACS, PE, dehydration, polypharmacy   Co morbidities that complicate the patient evaluation  HFrEF, HTN, HLD, paroxysmal atrial fibrillation (not on anticoagulation due to history of GI bleed), chronic anemia, ESRD   Additional history obtained:  Additional history obtained from patient's husband External records from outside source obtained and reviewed including EMR   Lab Tests:  I Ordered, and personally interpreted labs.  The pertinent results include: Stable mildly elevated problems, consistent with ESRD; baseline macrocytic anemia, no leukocytosis, baseline creatinine and BUN, mildly elevated glucose, slight hypokalemia   Imaging Studies ordered:  I ordered imaging studies including chest x-ray I independently visualized and interpreted imaging which showed no acute findings I agree with the radiologist interpretation   Cardiac Monitoring:  The patient was maintained on a cardiac monitor.  I personally viewed and interpreted the cardiac monitored which showed an underlying rhythm of: Tachycardia  Test Considered:  CTA chest, deferred to inpatient team  Consultations Obtained:  I requested consultation with the cardiologist,  and discussed lab and  imaging findings as well as pertinent plan - they recommend: Discontinuing use of Imdur and Toprol, continuing current 5 mg dose of midodrine   Problem List / ED Course:  Very pleasant 81 year old female  presenting to the ED for concern of chest pain.  She had a admission 1 month ago for NSTEMI and underwent a heart cath at that time.  Onset of chest pain was this morning and has since improved.  Chest pain is minimal at time of arrival to the ED.  Her vital signs are notable for tachycardia and hypotension.  Patient has had ongoing hypotension and this has resulted in no fluid being taken off during dialysis on Friday.  She did have a make-up session of dialysis yesterday and 1 L was removed.  She has not had any shortness of breath.  On exam, she does not have any extremity edema.  She does state that her abdomen feels distended but it is soft and nontender at this time.  EKG shows LBBB, consistent with prior tracings.  Laboratory work-up was initiated and no acute findings are present.  Patient's mildly elevated troponin is consistent with ESRD and there was no change on the delta troponin.  While in the ED, she did have resolution of her chest discomfort.  Given her hypotension, I did discuss this case with cardiologist on-call, Dr. Harl Bowie.  Dr. Harl Bowie recommended discontinuing use of Imdur as well as Toprol given her persistent hypotension.  I do suspect that her tachycardia is secondary to her hypotension.  She has not had any recent infectious symptoms and is afebrile in the ED.  She does not have a leukocytosis.  Given that she is anuric, will not give IV fluids at this time.  Patient is permitted to eat and drink.  Patient was admitted to the hospitalist due to her persistent hypotension and tachycardia.  She is currently undergoing a washout of her antihypertensive medications.  If vital signs do not normalize, she will require further testing.  Additionally, she would benefit from in-hospital  dialysis tomorrow due to recent concerns of hypotension during these dialysis sessions.   Reevaluation:  After the interventions noted above, I reevaluated the patient and found that they have :resolved   Social Determinants of Health:  Multiple chronic illnesses   Dispostion:  After consideration of the diagnostic results and the patients response to treatment, I feel that the patent would benefit from admission to hospital.          Final Clinical Impression(s) / ED Diagnoses Final diagnoses:  Chest pain, unspecified type  Tachycardia  Hypotension, unspecified hypotension type    Rx / DC Orders ED Discharge Orders     None         Godfrey Pick, MD 08/15/21 1725

## 2021-08-15 NOTE — H&P (Addendum)
History and Physical    Shawna [name] GDJ:[id number] DOB: 07/26/1940 DOA: 08/15/2021  PCP: [facility]  Patient coming from: Home  I have personally briefly reviewed patient's old medical records in [place]  Chief Complaint: Chest pain  HPI: Shawna [name] is a 81 y.o. female with medical history significant for ESRD, systolic and diastolic CHF, atrial fibrillation, ESRD, CABG, diabetes mellitus, GI bleed. Patient presented to the ED with complaints of onset of chest pain this morning.  She was dressing him get ready to go to church when chest pain started.  She reports chest pain on the right side and moved to the left.  No radiation down her arms or her neck.  No associated difficulty breathing.  Not worse with deep breathing or cough.  She goes for dialysis MWF, but the last few sessions they have not been able to pull off enough fluid due to her low blood pressure.  She reports the midodrine helps a bit.  She had to go again yesterday Saturday but they were able to pull just 1 L.  She feels her abdomen is bloated.  She denies difficulty breathing, no leg swelling.  She does not know her dry weight.  She does not make any urine.   She reports prior similar chest pains about 3 weeks ago, which she was admitted to the hospital 1/5-1/14 NSTEMI, started on heparin drip initially and was discontinued due to drop in hemoglobin, cardiac cath was done which showed-unchanged anatomy compared to her prior cath in 2020, patent grafts.  It was felt that her chest pain and mild enzyme leak (Troponin elevated 449) was secondary to demand ischemia, and medical therapy was recommended. She required 1 unit PRBC of blood.  ED Course: Tachycardic heart rate persistently 1 12-121.  Respiratory rate 14- 30.  Blood pressure systolic down to 62/22, subsequently improved systolic now 979G. Troponin 27 x 2.  EKG without significant changes from prior.  Potassium 3.3.   Chest x-ray clear. EDP  talked to cardiology, recommended d/c Imdur, metoprolol Hospitalist to admit for hypotension, tachycardia and chest pain.  Review of Systems: As per HPI all other systems reviewed and negative.  Past Medical History:  Diagnosis Date   Acute on chronic respiratory failure with hypoxia ([place]) 10/10/2016   Anxiety    Arthritis    AVM (arteriovenous malformation) of colon    CAD (coronary artery disease)    a. s/p CABG in 2013 b. DES to D1 in 10/2016. c. cath in 07/2018 showing patent grafts with occlusion of D1 at prior stent site and progression of PDA disease --> medical management recommended   Carotid artery disease ([name])    a. 92-11% LICA, 03/4173    Chronic anemia    Chronic bronchitis (HCC)    Chronic diastolic CHF (congestive heart failure) ([place])    a. 02/2012 Echo EF 60-65%, nl wall motion, Gr 1 DD, mod MR   Colon cancer ([place]) 1992   Esophageal stricture    ESRD on hemodialysis ([place])    ESRD due to HTN, started dialysis 2011 and gets HD at [facility] with Dr Hinda Lenis on MWF schedule.  Access is LUA AVF as of Sept 2014.    GERD (gastroesophageal reflux disease)    High cholesterol 12/2011   History of blood transfusion 07/2011; 12/2011; 01/2012 X 2; 04/2012   History of gout    History of lower GI bleeding    Hypertension    Iron deficiency anemia  Jugular vein occlusion, right (HCC)    Mitral regurgitation    a. Moderate by echo, 02/2012   Myocardial infarction [facility])    NSVT (nonsustained ventricular tachycardia)    Ovarian cancer ([place]) 1992   PAF (paroxysmal atrial fibrillation) ([place])    Pneumonia ~ 2009   PUD (peptic ulcer disease)    TIA (transient ischemic attack)     Past Surgical History:  Procedure Laterality Date   A/V SHUNTOGRAM Left 03/19/2019   Procedure: A/V SHUNTOGRAM;  Surgeon: Katha Cabal, MD;  Location: [place] CV LAB;  Service: Cardiovascular;  Laterality: Left;   ABDOMINAL HYSTERECTOMY  1992   APPENDECTOMY  06/1990   AV FISTULA PLACEMENT   07/2009   left upper arm   AV FISTULA PLACEMENT Right 09/06/2016   Procedure: RIGHT FOREARM ARTERIOVENOUS (AV) GRAFT;  Surgeon: Elam Dutch, MD;  Location: [place];  Service: Vascular;  Laterality: Right;   AV FISTULA PLACEMENT N/A 02/24/2017   Procedure: INSERTION OF ARTERIOVENOUS (AV) GORE-TEX GRAFT ARM (BRACHIAL AXILLARY);  Surgeon: Katha Cabal, MD;  Location: [facility]S;  Service: Vascular;  Laterality: N/A;   [place] Right 09/06/2016   Procedure: REMOVAL OF Right Arm ARTERIOVENOUS GORETEX GRAFT and Vein Patch angioplasty of brachial artery;  Surgeon: Angelia Mould, MD;  Location: [place];  Service: Vascular;  Laterality: Right;   BIOPSY  09/26/2019   Procedure: BIOPSY;  Surgeon: Rogene Houston, MD;  [facility];  Service: Endoscopy;;   COLON RESECTION  1992   COLON SURGERY     COLONOSCOPY N/A 03/09/2019   Procedure: COLONOSCOPY;  Surgeon: Rogene Houston, MD;  [facility];  Service: Endoscopy;  Laterality: N/A;   COLONOSCOPY WITH PROPOFOL N/A 06/08/2021   Procedure: COLONOSCOPY WITH PROPOFOL;  Surgeon: Harvel Quale, MD;  [facility];  Service: Gastroenterology;  Laterality: N/A;  9:05 /Patient is on dialysis Mon Wed Fri   CORONARY ANGIOPLASTY WITH STENT PLACEMENT  12/15/11   "2"   CORONARY ANGIOPLASTY WITH STENT PLACEMENT  y/2013   "1; makes total of 3" (05/02/2012)   CORONARY ARTERY BYPASS GRAFT  06/13/2012   Procedure: CORONARY ARTERY BYPASS GRAFTING (CABG);  Surgeon: Grace Isaac, MD;  Location: [place];  Service: Open Heart Surgery;  Laterality: N/A;  cabg x four;  using left internal mammary artery, and left leg greater saphenous vein harvested endoscopically   CORONARY STENT INTERVENTION N/A 10/13/2016   Procedure: Coronary Stent Intervention;  Surgeon: Troy Sine, MD;  Location: [place] CV LAB;  Service: Cardiovascular;  Laterality: N/A;   DIALYSIS/PERMA CATHETER REMOVAL N/A 04/18/2017   Procedure:  DIALYSIS/PERMA CATHETER REMOVAL;  Surgeon: Katha Cabal, MD;  Location: [place] [facility];  Service: Cardiovascular;  Laterality: N/A;   DILATION AND CURETTAGE OF UTERUS     ENTEROSCOPY N/A 06/08/2021   Procedure: PUSH ENTEROSCOPY;  Surgeon: Harvel Quale, MD;  [facility];  Service: Gastroenterology;  Laterality: N/A;   ESOPHAGOGASTRODUODENOSCOPY  01/20/2012   Procedure: ESOPHAGOGASTRODUODENOSCOPY (EGD);  Surgeon: Ladene Artist, MD,FACG;  Location: [facility] ENDOSCOPY;  Service: Endoscopy;  Laterality: N/A;   ESOPHAGOGASTRODUODENOSCOPY N/A 03/26/2013   Procedure: ESOPHAGOGASTRODUODENOSCOPY (EGD);  Surgeon: Irene Shipper, MD;  Location: [facility] ENDOSCOPY;  Service: Endoscopy;  Laterality: N/A;   ESOPHAGOGASTRODUODENOSCOPY N/A 04/30/2015   Procedure: ESOPHAGOGASTRODUODENOSCOPY (EGD);  Surgeon: Rogene Houston, MD;  [facility];  Service: Endoscopy;  Laterality: N/A;  1pm - moved to 10/20 @ 1:10  ESOPHAGOGASTRODUODENOSCOPY N/A 07/29/2016   Procedure: ESOPHAGOGASTRODUODENOSCOPY (EGD);  Surgeon: Manus Gunning, MD;  Location: [place];  Service: Gastroenterology;  Laterality: N/A;  enteroscopy   ESOPHAGOGASTRODUODENOSCOPY N/A 09/26/2019   Procedure: ESOPHAGOGASTRODUODENOSCOPY (EGD);  Surgeon: Rogene Houston, MD;  [facility];  Service: Endoscopy;  Laterality: N/A;  1250   ESOPHAGOGASTRODUODENOSCOPY (EGD) WITH PROPOFOL N/A 02/05/2021   Procedure: ESOPHAGOGASTRODUODENOSCOPY (EGD) WITH PROPOFOL;  Surgeon: Eloise Harman, DO;  [facility];  Service: Endoscopy;  Laterality: N/A;   GIVENS CAPSULE STUDY N/A 03/07/2019   Procedure: GIVENS CAPSULE STUDY;  Surgeon: Rogene Houston, MD;  [facility];  Service: Endoscopy;  Laterality: N/A;  7:30   GIVENS CAPSULE STUDY N/A 04/22/2021   Procedure: GIVENS CAPSULE STUDY;  Surgeon: Rogene Houston, MD;  [facility];  Service: Endoscopy;  Laterality: N/A;  7:30   INSERTION  OF DIALYSIS CATHETER N/A 10/05/2020   Procedure: ABORTED TUNNELED DIALYSIS CATHETER PLACEMENT RIGHT INTERNAL JUGULAR VEIN ;  Surgeon: Virl Cagey, MD;  [facility];  Service: General;  Laterality: N/A;   INTRAOPERATIVE TRANSESOPHAGEAL ECHOCARDIOGRAM  06/13/2012   Procedure: INTRAOPERATIVE TRANSESOPHAGEAL ECHOCARDIOGRAM;  Surgeon: Grace Isaac, MD;  Location: [place];  Service: Open Heart Surgery;  Laterality: N/A;   IR DIALY SHUNT INTRO NEEDLE/INTRACATH INITIAL W/IMG LEFT Left 10/06/2020   IR FLUORO GUIDE CV LINE RIGHT  06/17/2020   IR GENERIC HISTORICAL  07/26/2016   IR FLUORO GUIDE CV LINE RIGHT 07/26/2016 Greggory Keen, MD [facility]   IR GENERIC HISTORICAL  07/26/2016   IR US GUIDE VASC ACCESS RIGHT 07/26/2016 Greggory Keen, MD [facility]   IR GENERIC HISTORICAL  08/02/2016   IR US GUIDE VASC ACCESS RIGHT 08/02/2016 Greggory Keen, MD [facility]   IR GENERIC HISTORICAL  08/02/2016   IR FLUORO GUIDE CV LINE RIGHT 08/02/2016 Greggory Keen, MD [facility]   IR RADIOLOGY PERIPHERAL GUIDED IV START  03/28/2017   IR REMOVAL TUN CV CATH W/O FL  08/11/2020   IR THROMBECTOMY AV FISTULA W/THROMBOLYSIS INC/SHUNT/IMG LEFT Left 06/17/2020   IR US GUIDE VASC ACCESS LEFT  06/17/2020   IR US GUIDE VASC ACCESS RIGHT  03/28/2017   IR US GUIDE VASC ACCESS RIGHT  06/17/2020   LEFT HEART CATH AND CORONARY ANGIOGRAPHY N/A 09/20/2016   Procedure: Left Heart Cath and Coronary Angiography;  Surgeon: [name], MD;  Location: [place] [facility];  Service: Cardiovascular;  Laterality: N/A;   LEFT HEART CATH AND CORS/GRAFTS ANGIOGRAPHY N/A 10/13/2016   Procedure: Left Heart Cath and Cors/Grafts Angiography;  Surgeon: Troy Sine, MD;  Location: [place] [facility];  Service: Cardiovascular;  Laterality: N/A;   LEFT HEART CATH AND CORS/GRAFTS ANGIOGRAPHY N/A 07/13/2018   Procedure: LEFT HEART CATH AND CORS/GRAFTS ANGIOGRAPHY;  Surgeon: Martinique, Peter M, MD;  Location: [place] [facility];  Service:  Cardiovascular;  Laterality: N/A;   LEFT HEART CATH AND CORS/GRAFTS ANGIOGRAPHY N/A 07/22/2021   Procedure: LEFT HEART CATH AND CORS/GRAFTS ANGIOGRAPHY;  Surgeon: Lorretta Harp, MD;  Location: [place] [facility];  Service: Cardiovascular;  Laterality: N/A;   LEFT HEART CATHETERIZATION WITH CORONARY ANGIOGRAM N/A 12/15/2011   Procedure: LEFT HEART CATHETERIZATION WITH CORONARY ANGIOGRAM;  Surgeon: Burnell Blanks, MD;  Location: [facility] CATH LAB;  Service: Cardiovascular;  Laterality: N/A;   LEFT HEART CATHETERIZATION WITH CORONARY ANGIOGRAM N/A 01/10/2012   Procedure: LEFT HEART CATHETERIZATION WITH CORONARY ANGIOGRAM;  Surgeon: Peter M Martinique, MD;  Location:  [place] CATH LAB;  Service: Cardiovascular;  Laterality: N/A;   LEFT HEART CATHETERIZATION WITH CORONARY ANGIOGRAM N/A 06/08/2012   Procedure: LEFT HEART CATHETERIZATION WITH CORONARY ANGIOGRAM;  Surgeon: Burnell Blanks, MD;  Location: [facility] CATH LAB;  Service: Cardiovascular;  Laterality: N/A;   LEFT HEART CATHETERIZATION WITH CORONARY/GRAFT ANGIOGRAM N/A 12/10/2013   Procedure: LEFT HEART CATHETERIZATION WITH Beatrix Fetters;  Surgeon: [name], MD;  Location: [facility] CATH LAB;  Service: Cardiovascular;  Laterality: N/A;   OVARY SURGERY     ovarian cancer   POLYPECTOMY  03/09/2019   Procedure: POLYPECTOMY;  Surgeon: Rogene Houston, MD;  [facility];  Service: Endoscopy;;  cecal    POLYPECTOMY N/A 09/26/2019   Procedure: DUODENAL POLYPECTOMY;  Surgeon: Rogene Houston, MD;  [facility];  Service: Endoscopy;  Laterality: N/A;   REVISION OF ARTERIOVENOUS GORETEX GRAFT N/A 02/24/2017   Procedure: REVISION OF ARTERIOVENOUS GORETEX GRAFT (RESECTION);  Surgeon: Katha Cabal, MD;  Location: [facility];  Service: Vascular;  Laterality: N/A;   REVISON OF ARTERIOVENOUS FISTULA Left 06/19/2020   Procedure: REVISION OF LEFT UPPER ARM AV GRAFT WITH INTERPOSITION JUMP GRAFT USING 6MM GORE LIMB;  Surgeon: Marty Heck, MD;  Location: [place];  Service: Vascular;  Laterality: Left;   SHUNTOGRAM N/A 10/15/2013   Procedure: Fistulogram;  Surgeon: Serafina Mitchell, MD;  Location: [facility] CATH LAB;  Service: Cardiovascular;  Laterality: N/A;   THROMBECTOMY / ARTERIOVENOUS GRAFT REVISION  2011   left upper arm   TUBAL LIGATION  1980's   UPPER EXTREMITY ANGIOGRAPHY Bilateral 12/06/2016   Procedure: Upper Extremity Angiography;  Surgeon: Katha Cabal, MD;  Location: [place] CV LAB;  Service: Cardiovascular;  Laterality: Bilateral;   UPPER EXTREMITY INTERVENTION Left 06/06/2017   Procedure: UPPER EXTREMITY INTERVENTION;  Surgeon: Katha Cabal, MD;  Location: [place] CV LAB;  Service: Cardiovascular;  Laterality: Left;     reports that she has never smoked. She has never used smokeless tobacco. She reports that she does not drink alcohol and does not use drugs.  Allergies  Allergen Reactions   Amlodipine Swelling   Aspirin Other (See Comments)    High Doses Mess up her stomach; "makes my bowels have blood in them". Takes 81 mg EC Aspirin    Nitrofurantoin Hives   Penicillins Other (See Comments)    SYNCOPE? , "makes me real weak when I take it; like I'll pass out"  Has patient had a PCN reaction causing immediate rash, facial/tongue/throat swelling, SOB or lightheadedness with hypotension: Yes Has patient had a PCN reaction causing severe rash involving mucus membranes or skin necrosis: no Has patient had a PCN reaction that required hospitalization no Has patient had a PCN reaction occurring within the last 10 years: no If all of the above    Bactrim [Sulfamethoxazole-Trimethoprim] Rash   Contrast Media [Iodinated Contrast Media] Itching   Iron Itching and Other (See Comments)    "they gave me iron in dialysis; had to give me Benadryl cause I had to have the iron" (05/02/2012)   Tylenol [Acetaminophen] Itching and Other (See Comments)    Makes her feet on fire per pt    Gabapentin Other (See Comments)    Unknown reaction   Iron Sucrose    Ranexa [Ranolazine] Other (See Comments)    Myoclonus-hospitalized    Sucroferric Oxyhydroxide    Dexilant [Dexlansoprazole] Other (See Comments)    Upset stomach   Hydralazine Itching  Has tolerated while inpatient Has tolerated while inpatient   Levaquin [Levofloxacin In D5w] Rash   Morphine And Related Itching and Other (See Comments)    Itching in feet   Pantoprazole Rash   Plavix [Clopidogrel Bisulfate] Rash   Protonix [Pantoprazole Sodium] Rash   Venofer [Ferric Oxide] Itching and Other (See Comments)    Patient reports using Benadryl prior to doses as [place]    Family History  Problem Relation Age of Onset   Heart disease Mother        Heart Disease before age 64   Hyperlipidemia Mother    Hypertension Mother    Diabetes Mother    Heart attack Mother    Heart disease Father        Heart Disease before age 31   Hyperlipidemia Father    Hypertension Father    Diabetes Father    Diabetes Sister    Hypertension Sister    Diabetes Brother    Hyperlipidemia Brother    Heart attack Brother    Hypertension Sister    Heart attack Brother    Colon cancer Child 45   Other Other        noncontributory for early CAD   Esophageal cancer Neg Hx    Liver disease Neg Hx    Kidney disease Neg Hx    Colon polyps Neg Hx    Prior to Admission medications   Medication Sig Start Date End Date Taking? Authorizing Provider  ALPRAZolam Duanne Moron) 0.5 MG tablet Take 0.5 mg by mouth at bedtime. 03/20/21   [provider]  amiodarone (PACERONE) 200 MG tablet Take 1 tablet (200 mg total) by mouth daily. 05/13/21   Strader, Fransisco Hertz, PA-C  aspirin EC 81 MG tablet Take 81 mg by mouth daily.  09/27/19   Rehman, Mechele Dawley, MD  hydrOXYzine (VISTARIL) 100 MG capsule Take 100 mg by mouth daily as needed for anxiety. 11/23/20   [provider]  isosorbide mononitrate (IMDUR) 30 MG 24 hr tablet Take 3  tablets (90 mg total) by mouth 2 (two) times daily. 07/24/21   Hosie Poisson, MD  levothyroxine (SYNTHROID) 25 MCG tablet Take 1 tablet (25 mcg total) by mouth daily. 02/02/21   Brita Romp, NP  lidocaine-prilocaine (EMLA) cream Apply 1 application topically every Monday, Wednesday, and Friday. Prior to dialysis    [provider]  loperamide (IMODIUM A-D) 2 MG tablet Take 2 tablets (4 mg total) by mouth daily as needed for diarrhea or loose stools. 12/17/20   Thurnell Lose, MD  metoprolol succinate (TOPROL XL) 25 MG 24 hr tablet Take 1 tablet (25 mg total) by mouth daily. 07/29/21   Strader, Fransisco Hertz, PA-C  midodrine (PROAMATINE) 5 MG tablet Take Monday, Wednesday, Friday morning BEFORE dialysis 12/22/20   Arnoldo Lenis, MD  multivitamin (RENA-VIT) TABS tablet Take 1 tablet by mouth daily.    [provider]  nitroGLYCERIN (NITROSTAT) 0.4 MG SL tablet Place 1 tablet (0.4 mg total) under the tongue every 5 (five) minutes x 3 doses as needed for chest pain (if no relief after 2nd dose, proceed to the ED for an evaluation or call 911). 02/19/21   Verta Ellen., NP  omeprazole (PRILOSEC) 20 MG capsule Take 1 capsule (20 mg total) by mouth daily. 12/01/20   Rehman, Mechele Dawley, MD  rosuvastatin (CRESTOR) 10 MG tablet Take 1 tablet (10 mg total) by mouth daily. 05/13/21   Erma Heritage, PA-C  sevelamer carbonate (RENVELA) 800 MG tablet Take 3 tablets (2,400 mg total) by mouth 3 (three) times daily with meals. 07/08/21   Shelly Coss, MD    Physical Exam: Vitals:   08/15/21 1830 08/15/21 1845 08/15/21 1900 08/15/21 1915  BP: 133/62 (!) 145/61 (!) 116/56 (!) 128/58  Pulse: (!) 120 (!) 121 (!) 120 (!) 119  Resp: (!) 23 (!) 21 (!) 22 19  Temp:      TempSrc:      SpO2: 99% 100% 99% 98%  Weight:      Height:        Constitutional: NAD, calm, comfortable Vitals:   08/15/21 1830 08/15/21 1845 08/15/21 1900 08/15/21 1915  BP: 133/62 (!) 145/61 (!) 116/56 (!)  128/58  Pulse: (!) 120 (!) 121 (!) 120 (!) 119  Resp: (!) 23 (!) 21 (!) 22 19  Temp:      TempSrc:      SpO2: 99% 100% 99% 98%  Weight:      Height:       Eyes: PERRL, lids and conjunctivae normal ENMT: Mucous membranes are moist. Posterior pharynx clear of any exudate or lesions.Normal dentition.  Neck: normal, supple, no masses, no thyromegaly Respiratory: clear to auscultation bilaterally, no wheezing, no crackles. Normal respiratory effort. No accessory muscle use.  Cardiovascular: Tachycardic regular rate and rhythm, no murmurs / rubs / gallops. No extremity edema. 2+ pedal pulses.  Abdomen: no tenderness, no masses palpated. No hepatosplenomegaly. Bowel sounds positive.  Musculoskeletal: no clubbing / cyanosis. No joint deformity upper and lower extremities. Good ROM, no contractures. Normal muscle tone.  HD access left arm, bruit present. Skin: no rashes, lesions, ulcers. No induration Neurologic: No apparent cranial abnormality, moving extremities spontaneously Psychiatric: Normal judgment and insight. Alert and oriented x 3. Normal mood.   Labs on Admission: I have personally reviewed following labs and imaging studies  CBC: Recent Labs  Lab 08/15/21 1205  WBC 5.5  NEUTROABS 4.0  HGB 9.7*  HCT 31.2*  MCV 117.3*  PLT PLATELET CLUMPS NOTED ON SMEAR   Basic Metabolic Panel: Recent Labs  Lab 08/15/21 1205  NA 136  K 3.3*  CL 98  CO2 27  GLUCOSE 173*  BUN 42*  CREATININE 5.25*  CALCIUM 9.3    Radiological Exams on Admission: DG Chest Port 1 View  Result Date: 08/15/2021 CLINICAL DATA:  Chest pain EXAM: PORTABLE CHEST 1 VIEW COMPARISON:  Chest x-rays dated 07/17/2021 and 03/29/2017. FINDINGS: Stable cardiomegaly. Median sternotomy wires appear intact and stable in alignment. Coarse lung markings bilaterally. No confluent opacity to suggest a superimposed pneumonia or pulmonary edema. No pleural effusion or pneumothorax is seen. No acute-appearing osseous  abnormality. IMPRESSION: 1. No active disease. No evidence of pneumonia or pulmonary edema. 2. Stable cardiomegaly. 3. Coarse lung markings bilaterally, suspected chronic interstitial lung disease. Electronically Signed   By: Franki Cabot M.D.   On: 08/15/2021 12:22    EKG: Independently reviewed.  Sinus or ectopic atrial tachycardia, left bundle branch block, QTC 562.  No significant change from prior.  Assessment/Plan Principal Problem:   Chest pain Active Problems:   Anemia in chronic kidney disease   Carotid artery disease (HCC)   Chronic diastolic CHF (congestive heart failure) (HCC)   ESRD (end stage renal disease) on dialysis [facility])   Atrial fibrillation (HCC)  Chest pain-with hypotension, tachycardia- sinus vs atria tachycardia.  Systolic down to 88.  Tachycardic heart rates in the 110s to 120s persistently-May be reactive due to hypotension.  History of CABG, recent cardiac cath stable findings from prior, patent grafts.  Troponin today flat at 27.  EKG without significant changes. - EDP talked to Dr. Harl Bowie, recommended d/c her metoprolol, Imdur -Resume Crestor, aspirin 81 mg daily -Resume midodrine  ESRD-due to hypotension, she has not been able to get fluid pulled off during dialysis.  She reports abdominal bloating.  Chest x-ray is clear. -Consult nephrology in the morning. -Continue midodrine  Prolonged QTC- 563. K- 3.3 - Check mag -Resume amiodarone  Chronic systolic and diastolic CHF-reports abdominal bloating, but weights per chart stable.  Last echo 07/2021, EF 30 to 35% with grade 2 DD. -Per HD. - BNP  Diabetes mellitus-random glucose 173. HgbA1c 4.6.  Not on medication -Daily CBG  Atrial fibrillation-currently in sinus rhythm, but tachycardic.  Not on anticoagulation, due to chronic anemia. -She is unable to confirm to me if she takes amiodarone listed on her medication list, per last cardiology notes she is to be on amiodarone, will resume - D/c metoprolol,  tachycardia may worsen  Chronic anemia hemoglobin 9.7.  Stable.   DVT prophylaxis:  Heparin Code Status: Full code Family Communication: Spouse at bedside Disposition Plan: ~ 1 - 2 days Consults called: Pls consult nephrology in a.m Admission status:  Obs tele   Bethena Roys MD [facility]  08/15/2021, 9:45 PM

## 2021-08-16 DIAGNOSIS — M109 Gout, unspecified: Secondary | ICD-10-CM | POA: Diagnosis present

## 2021-08-16 DIAGNOSIS — I483 Typical atrial flutter: Secondary | ICD-10-CM

## 2021-08-16 DIAGNOSIS — Z992 Dependence on renal dialysis: Secondary | ICD-10-CM | POA: Diagnosis not present

## 2021-08-16 DIAGNOSIS — I5042 Chronic combined systolic (congestive) and diastolic (congestive) heart failure: Secondary | ICD-10-CM | POA: Diagnosis present

## 2021-08-16 DIAGNOSIS — Z7189 Other specified counseling: Secondary | ICD-10-CM | POA: Diagnosis not present

## 2021-08-16 DIAGNOSIS — I252 Old myocardial infarction: Secondary | ICD-10-CM | POA: Diagnosis not present

## 2021-08-16 DIAGNOSIS — I5021 Acute systolic (congestive) heart failure: Secondary | ICD-10-CM | POA: Diagnosis not present

## 2021-08-16 DIAGNOSIS — I10 Essential (primary) hypertension: Secondary | ICD-10-CM | POA: Diagnosis not present

## 2021-08-16 DIAGNOSIS — I4892 Unspecified atrial flutter: Secondary | ICD-10-CM | POA: Diagnosis present

## 2021-08-16 DIAGNOSIS — E7800 Pure hypercholesterolemia, unspecified: Secondary | ICD-10-CM | POA: Diagnosis present

## 2021-08-16 DIAGNOSIS — I480 Paroxysmal atrial fibrillation: Secondary | ICD-10-CM | POA: Diagnosis present

## 2021-08-16 DIAGNOSIS — I25110 Atherosclerotic heart disease of native coronary artery with unstable angina pectoris: Secondary | ICD-10-CM

## 2021-08-16 DIAGNOSIS — R079 Chest pain, unspecified: Secondary | ICD-10-CM | POA: Diagnosis not present

## 2021-08-16 DIAGNOSIS — Z515 Encounter for palliative care: Secondary | ICD-10-CM | POA: Diagnosis not present

## 2021-08-16 DIAGNOSIS — R000 Tachycardia, unspecified: Secondary | ICD-10-CM | POA: Diagnosis present

## 2021-08-16 DIAGNOSIS — I132 Hypertensive heart and chronic kidney disease with heart failure and with stage 5 chronic kidney disease, or end stage renal disease: Secondary | ICD-10-CM | POA: Diagnosis present

## 2021-08-16 DIAGNOSIS — J42 Unspecified chronic bronchitis: Secondary | ICD-10-CM | POA: Diagnosis present

## 2021-08-16 DIAGNOSIS — Z9851 Tubal ligation status: Secondary | ICD-10-CM | POA: Diagnosis not present

## 2021-08-16 DIAGNOSIS — E1122 Type 2 diabetes mellitus with diabetic chronic kidney disease: Secondary | ICD-10-CM | POA: Diagnosis present

## 2021-08-16 DIAGNOSIS — I208 Other forms of angina pectoris: Secondary | ICD-10-CM | POA: Diagnosis not present

## 2021-08-16 DIAGNOSIS — Z20822 Contact with and (suspected) exposure to covid-19: Secondary | ICD-10-CM | POA: Diagnosis present

## 2021-08-16 DIAGNOSIS — K219 Gastro-esophageal reflux disease without esophagitis: Secondary | ICD-10-CM | POA: Diagnosis present

## 2021-08-16 DIAGNOSIS — Z951 Presence of aortocoronary bypass graft: Secondary | ICD-10-CM | POA: Diagnosis not present

## 2021-08-16 DIAGNOSIS — D631 Anemia in chronic kidney disease: Secondary | ICD-10-CM | POA: Diagnosis present

## 2021-08-16 DIAGNOSIS — N186 End stage renal disease: Secondary | ICD-10-CM | POA: Diagnosis present

## 2021-08-16 DIAGNOSIS — I4891 Unspecified atrial fibrillation: Secondary | ICD-10-CM | POA: Diagnosis not present

## 2021-08-16 DIAGNOSIS — I959 Hypotension, unspecified: Secondary | ICD-10-CM | POA: Diagnosis present

## 2021-08-16 DIAGNOSIS — I248 Other forms of acute ischemic heart disease: Secondary | ICD-10-CM | POA: Diagnosis present

## 2021-08-16 DIAGNOSIS — I2510 Atherosclerotic heart disease of native coronary artery without angina pectoris: Secondary | ICD-10-CM | POA: Diagnosis present

## 2021-08-16 DIAGNOSIS — I2582 Chronic total occlusion of coronary artery: Secondary | ICD-10-CM | POA: Diagnosis present

## 2021-08-16 DIAGNOSIS — E876 Hypokalemia: Secondary | ICD-10-CM | POA: Diagnosis present

## 2021-08-16 DIAGNOSIS — M1990 Unspecified osteoarthritis, unspecified site: Secondary | ICD-10-CM | POA: Diagnosis present

## 2021-08-16 DIAGNOSIS — F419 Anxiety disorder, unspecified: Secondary | ICD-10-CM | POA: Diagnosis present

## 2021-08-16 LAB — TROPONIN I (HIGH SENSITIVITY)
Troponin I (High Sensitivity): 46 ng/L — ABNORMAL HIGH (ref <18)
Troponin I (High Sensitivity): 49 ng/L — ABNORMAL HIGH (ref <18)

## 2021-08-16 LAB — BASIC METABOLIC PANEL
Anion gap: 12 (ref 5–15)
BUN: 53 mg/dL — ABNORMAL HIGH (ref 8–23)
CO2: 25 mmol/L (ref 22–32)
Calcium: 9.4 mg/dL (ref 8.9–10.3)
Chloride: 103 mmol/L (ref 98–111)
Creatinine, Ser: 6.87 mg/dL — ABNORMAL HIGH (ref 0.44–1.00)
GFR, Estimated: 6 mL/min — ABNORMAL LOW (ref >60)
Glucose, Bld: 82 mg/dL (ref 70–99)
Potassium: 3.3 mmol/L — ABNORMAL LOW (ref 3.5–5.1)
Sodium: 140 mmol/L (ref 135–145)

## 2021-08-16 LAB — CBC
HCT: 27.8 % — ABNORMAL LOW (ref 36.0–46.0)
Hemoglobin: 8.9 g/dL — ABNORMAL LOW (ref 12.0–15.0)
MCH: 36.9 pg — ABNORMAL HIGH (ref 26.0–34.0)
MCHC: 32 g/dL (ref 30.0–36.0)
MCV: 115.4 fL — ABNORMAL HIGH (ref 80.0–100.0)
Platelets: 163 10*3/uL (ref 150–400)
RBC: 2.41 MIL/uL — ABNORMAL LOW (ref 3.87–5.11)
RDW: 19.9 % — ABNORMAL HIGH (ref 11.5–15.5)
WBC: 4.6 10*3/uL (ref 4.0–10.5)
nRBC: 0 % (ref 0.0–0.2)

## 2021-08-16 LAB — HEPARIN LEVEL (UNFRACTIONATED): Heparin Unfractionated: 0.35 IU/mL (ref 0.30–0.70)

## 2021-08-16 MED ORDER — PENTAFLUOROPROP-TETRAFLUOROETH EX AERO
1.0000 "application " | INHALATION_SPRAY | CUTANEOUS | Status: DC | PRN
  Filled 2021-08-16: qty 30

## 2021-08-16 MED ORDER — HEPARIN (PORCINE) 25000 UT/250ML-% IV SOLN
950.0000 [IU]/h | INTRAVENOUS | Status: DC
  Administered 2021-08-16: 600 [IU]/h via INTRAVENOUS
  Administered 2021-08-17: 750 [IU]/h via INTRAVENOUS
  Administered 2021-08-19: 950 [IU]/h via INTRAVENOUS
  Filled 2021-08-16 (×3): qty 250

## 2021-08-16 MED ORDER — MIDODRINE HCL 5 MG PO TABS
10.0000 mg | ORAL_TABLET | Freq: Three times a day (TID) | ORAL | Status: DC
  Administered 2021-08-16 – 2021-08-19 (×8): 10 mg via ORAL
  Filled 2021-08-16 (×9): qty 2

## 2021-08-16 MED ORDER — METOPROLOL TARTRATE 25 MG PO TABS
12.5000 mg | ORAL_TABLET | Freq: Two times a day (BID) | ORAL | Status: DC
  Filled 2021-08-16 (×2): qty 1

## 2021-08-16 MED ORDER — CHLORHEXIDINE GLUCONATE CLOTH 2 % EX PADS
6.0000 | MEDICATED_PAD | Freq: Every day | CUTANEOUS | Status: DC
  Administered 2021-08-16 – 2021-08-18 (×2): 6 via TOPICAL

## 2021-08-16 MED ORDER — NITROGLYCERIN 0.4 MG SL SUBL
0.4000 mg | SUBLINGUAL_TABLET | SUBLINGUAL | Status: DC | PRN
  Administered 2021-08-16 – 2021-08-17 (×11): 0.4 mg via SUBLINGUAL
  Filled 2021-08-16 (×5): qty 1

## 2021-08-16 MED ORDER — SODIUM CHLORIDE 0.9 % IV SOLN: 100.0000 mL | INTRAVENOUS | Status: DC | PRN

## 2021-08-16 MED ORDER — DARBEPOETIN ALFA 150 MCG/0.3ML IJ SOSY
PREFILLED_SYRINGE | INTRAMUSCULAR | Status: AC
  Administered 2021-08-16: 150 ug via INTRAVENOUS
  Filled 2021-08-16: qty 0.3

## 2021-08-16 MED ORDER — LIDOCAINE HCL (PF) 1 % IJ SOLN: 5.0000 mL | INTRAMUSCULAR | Status: DC | PRN

## 2021-08-16 MED ORDER — DARBEPOETIN ALFA 150 MCG/0.3ML IJ SOSY
150.0000 ug | PREFILLED_SYRINGE | INTRAMUSCULAR | Status: DC
  Filled 2021-08-16 (×2): qty 0.3

## 2021-08-16 MED ORDER — LIDOCAINE-PRILOCAINE 2.5-2.5 % EX CREA: 1.0000 "application " | TOPICAL_CREAM | CUTANEOUS | Status: DC | PRN

## 2021-08-16 MED ORDER — POTASSIUM CHLORIDE CRYS ER 20 MEQ PO TBCR
20.0000 meq | EXTENDED_RELEASE_TABLET | Freq: Once | ORAL | Status: AC
  Administered 2021-08-16: 20 meq via ORAL
  Filled 2021-08-16: qty 1

## 2021-08-16 NOTE — Procedures (Signed)
° °  HEMODIALYSIS TREATMENT NOTE: ° ° °Uneventful 3.5 hour heparin-free session completed via left upper arm AVG (17g/antegrade). Goal met: 1 liter removed without interruption in UF.  All blood was returned and hemostasis was achieved in 15 minutes.  No changes from pre-HD assessment. ° ° , RN °

## 2021-08-16 NOTE — Assessment & Plan Note (Addendum)
-   Converted to sinus rhythm on IV amiodarone -Discussed with cardiologist Dr Johnsie Cancel, he recommends transition to oral amiodarone  -Okay to stop IV heparin on 08/19/2021\ CHA2DS2- VASc score   is = 5 (age x 2, HTN, CHF, female)    Which is  equal to =7.2  % annual risk of stroke  -Risk versus benefit of full anticoagulation discussed with patient patient declines full anticoagulation due to concerns for bleeding, patient required blood transfusions from time to time due to chronic anemia of ESRD with superimposed PRESUMED GI bleed from time to time -Patient was unable to have previously scheduled colonoscopy -Continue Bystolic and amiodarone for rate control -No further chest pains palpitations or dizziness at this time

## 2021-08-16 NOTE — Progress Notes (Signed)
Patient complains of chest pain. 10/10. O2 improved the pain to 6/10. Nitro improved the pain to 3/10. EKG is similar to previous. Troponin pending.

## 2021-08-16 NOTE — Progress Notes (Addendum)
ANTICOAGULATION CONSULT NOTE -Consult  Pharmacy Consult for heparin Indication: chest pain/ACS  Allergies  Allergen Reactions   Amlodipine Swelling   Aspirin Other (See Comments)    High Doses Mess up her stomach; "makes my bowels have blood in them". Takes 81 mg EC Aspirin    Nitrofurantoin Hives   Penicillins Other (See Comments)    SYNCOPE? , "makes me real weak when I take it; like I'll pass out"  Has patient had a PCN reaction causing immediate rash, facial/tongue/throat swelling, SOB or lightheadedness with hypotension: Yes Has patient had a PCN reaction causing severe rash involving mucus membranes or skin necrosis: no Has patient had a PCN reaction that required hospitalization no Has patient had a PCN reaction occurring within the last 10 years: no If all of the above    Bactrim [Sulfamethoxazole-Trimethoprim] Rash   Contrast Media [Iodinated Contrast Media] Itching   Iron Itching and Other (See Comments)    "they gave me iron in dialysis; had to give me Benadryl cause I had to have the iron" (05/02/2012)   Tylenol [Acetaminophen] Itching and Other (See Comments)    Makes her feet on fire per pt   Gabapentin Other (See Comments)    Unknown reaction   Iron Sucrose    Ranexa [Ranolazine] Other (See Comments)    Myoclonus-hospitalized    Sucroferric Oxyhydroxide    Dexilant [Dexlansoprazole] Other (See Comments)    Upset stomach   Hydralazine Itching    Has tolerated while inpatient Has tolerated while inpatient   Levaquin [Levofloxacin In D5w] Rash   Morphine And Related Itching and Other (See Comments)    Itching in feet   Pantoprazole Rash   Plavix [Clopidogrel Bisulfate] Rash   Protonix [Pantoprazole Sodium] Rash   Venofer [Ferric Oxide] Itching and Other (See Comments)    Patient reports using Benadryl prior to doses as [facility]    Patient Measurements: Height: 5\' 1"  (154.9 cm) Weight: 56 kg (123 lb 7.3 oz) IBW/kg (Calculated) : 47.8  Vital  Signs: Temp: 97.9 F (36.6 C) (02/06 2000) Temp Source: Oral (02/06 2000) BP: 140/54 (02/06 2000) Pulse Rate: 72 (02/06 1930)  Labs: Recent Labs    08/15/21 1205 08/15/21 1401 08/16/21 0511 08/16/21 0851 08/16/21 1723  HGB 9.7*  --  8.9*  --   --   HCT 31.2*  --  27.8*  --   --   PLT PLATELET CLUMPS NOTED ON SMEAR  --  163  --   --   HEPARINUNFRC  --   --   --   --  0.35  CREATININE 5.25*  --  6.87*  --   --   TROPONINIHS 27* 27* 46* 49*  --      Estimated Creatinine Clearance: 4.8 mL/min (A) (by C-G formula based on SCr of 6.87 mg/dL (H)).   Medical History: Past Medical History:  Diagnosis Date   Acute on chronic respiratory failure with hypoxia ([place]) 10/10/2016   Anxiety    Arthritis    AVM (arteriovenous malformation) of colon    CAD (coronary artery disease)    a. s/p CABG in 2013 b. DES to D1 in 10/2016. c. cath in 07/2018 showing patent grafts with occlusion of D1 at prior stent site and progression of PDA disease --> medical management recommended   Carotid artery disease ([place])    a. 94-76% LICA, 11/4648    Chronic anemia    Chronic bronchitis ([facility])    Chronic diastolic CHF (congestive heart  failure) ([place])    a. 02/2012 Echo EF 60-65%, nl wall motion, Gr 1 DD, mod MR   Colon cancer ([place]) 1992   Esophageal stricture    ESRD on hemodialysis ([place])    ESRD due to HTN, started dialysis 2011 and gets HD at [facility] with Dr Hinda Lenis on MWF schedule.  Access is LUA AVF as of Sept 2014.    GERD (gastroesophageal reflux disease)    High cholesterol 12/2011   History of blood transfusion 07/2011; 12/2011; 01/2012 X 2; 04/2012   History of gout    History of lower GI bleeding    Hypertension    Iron deficiency anemia    Jugular vein occlusion, right (HCC)    Mitral regurgitation    a. Moderate by echo, 02/2012   Myocardial infarction [facility])    NSVT (nonsustained ventricular tachycardia)    Ovarian cancer ([place]) 1992   PAF (paroxysmal atrial fibrillation) ([place])     Pneumonia ~ 2009   PUD (peptic ulcer disease)    TIA (transient ischemic attack)     Medications:  Medications Prior to Admission  Medication Sig Dispense Refill Last Dose   ALPRAZolam (XANAX) 0.5 MG tablet Take 0.5 mg by mouth at bedtime.   08/15/2021   amiodarone (PACERONE) 200 MG tablet Take 1 tablet (200 mg total) by mouth daily. 90 tablet 2 08/15/2021   aspirin EC 81 MG tablet Take 81 mg by mouth daily.    08/15/2021   hydrOXYzine (VISTARIL) 100 MG capsule Take 100 mg by mouth daily as needed for anxiety.   unk   isosorbide mononitrate (IMDUR) 30 MG 24 hr tablet Take 3 tablets (90 mg total) by mouth 2 (two) times daily. 60 tablet 3 08/15/2021   levothyroxine (SYNTHROID) 25 MCG tablet Take 1 tablet (25 mcg total) by mouth daily. 90 tablet 0 08/15/2021   lidocaine-prilocaine (EMLA) cream Apply 1 application topically every Monday, Wednesday, and Friday. Prior to dialysis   Past Week   loperamide (IMODIUM A-D) 2 MG tablet Take 2 tablets (4 mg total) by mouth daily as needed for diarrhea or loose stools.   unk   metoprolol succinate (TOPROL XL) 25 MG 24 hr tablet Take 1 tablet (25 mg total) by mouth daily. 90 tablet 3 08/15/2021 at 0800   midodrine (PROAMATINE) 5 MG tablet Take Monday, Wednesday, Friday morning BEFORE dialysis 48 tablet 6 Past Week   multivitamin (RENA-VIT) TABS tablet Take 1 tablet by mouth daily.   08/15/2021   nitroGLYCERIN (NITROSTAT) 0.4 MG SL tablet Place 1 tablet (0.4 mg total) under the tongue every 5 (five) minutes x 3 doses as needed for chest pain (if no relief after 2nd dose, proceed to the ED for an evaluation or call 911). 25 tablet 3 unk   omeprazole (PRILOSEC) 20 MG capsule Take 1 capsule (20 mg total) by mouth daily. 90 capsule 3 08/15/2021   rosuvastatin (CRESTOR) 10 MG tablet Take 1 tablet (10 mg total) by mouth daily. 90 tablet 3 08/15/2021   sevelamer carbonate (RENVELA) 800 MG tablet Take 3 tablets (2,400 mg total) by mouth 3 (three) times daily with meals.   08/15/2021    Scheduled:   aspirin EC  81 mg Oral Daily   Chlorhexidine Gluconate Cloth  6 each Topical Q0600   darbepoetin (ARANESP) injection - DIALYSIS  150 mcg Intravenous Q Mon-HD   levothyroxine  25 mcg Oral Daily   metoprolol tartrate  12.5 mg Oral BID   midodrine  10 mg Oral  TID WC   rosuvastatin  10 mg Oral Daily   sevelamer carbonate  2,400 mg Oral TID WC    Assessment: 81yo female c/o CP similar to CP that led to a recent hospital admission, had recurrent CP this am that responded well to O2 and SL NTG >> to start heparin infusion.  Of note on recent admission heparin was stopped d/t drop in Hgb (HIT Ab negative) so will aim for a low goal and adjust rates cautiously.  HL 0.35, therapeutic   Goal of Therapy:  Heparin level 0.3-0.5 units/ml Monitor platelets by anticoagulation protocol: Yes   Plan:  Continue heparin IV @ 600 units/hr Will recheck HL in 8 hours per protocol  Thomasenia Sales, PharmD, [facility] Clinical Pharmacist  08/16/2021,8:13 PM

## 2021-08-16 NOTE — Assessment & Plan Note (Addendum)
-   Currently stable,- -continue aspirin, bystolic and Crestor

## 2021-08-16 NOTE — Consult Note (Signed)
Reason for Consult: To manage dialysis and dialysis related needs Referring Physician: Jahne Krukowski is an 81 y.o. female with ESRD, systolic and diastolic CHF, atrial fibrillation, ESRD, CABG, diabetes mellitus-  frequent hospitalizations- most recently discharged on 07/24/21 where she had a cardiac cath and medical management was recommended. She presented yesterday with complaints of CP.  Regarding HD -  she goes MWF-  UF is a struggle due to low BP's- now on midodrine-  actually had an extra HD on Saturday PTA-  just cleaned her blood because could not remove fluid-  she does not seem fluid overloaded today -  supposedly 2 kg over her EDW    Dialysis Orders:  [facility] - MWF-  3.5 hours  EDW 54. HD Bath 2k/2.5 ca, Cellent 17H KOA, Heparin none. Access LUE AVG BFR 300, DFR 500- 15 guage. Micera 200 mcg IV every 2 weeks  Past Medical History:  Diagnosis Date   Acute on chronic respiratory failure with hypoxia (HCC) 10/10/2016   Anxiety    Arthritis    AVM (arteriovenous malformation) of colon    CAD (coronary artery disease)    a. s/p CABG in 2013 b. DES to D1 in 10/2016. c. cath in 07/2018 showing patent grafts with occlusion of D1 at prior stent site and progression of PDA disease --> medical management recommended   Carotid artery disease ([place])    a. 27-25% LICA, 09/6642    Chronic anemia    Chronic bronchitis (HCC)    Chronic diastolic CHF (congestive heart failure) ([place])    a. 02/2012 Echo EF 60-65%, nl wall motion, Gr 1 DD, mod MR   Colon cancer ([place]) 1992   Esophageal stricture    ESRD on hemodialysis ([place])    ESRD due to HTN, started dialysis 2011 and gets HD at [facility] with Dr Hinda Lenis on MWF schedule.  Access is LUA AVF as of Sept 2014.    GERD (gastroesophageal reflux disease)    High cholesterol 12/2011   History of blood transfusion 07/2011; 12/2011; 01/2012 X 2; 04/2012   History of gout    History of lower GI bleeding    Hypertension    Iron deficiency  anemia    Jugular vein occlusion, right (HCC)    Mitral regurgitation    a. Moderate by echo, 02/2012   Myocardial infarction [facility])    NSVT (nonsustained ventricular tachycardia)    Ovarian cancer ([place]) 1992   PAF (paroxysmal atrial fibrillation) ([place])    Pneumonia ~ 2009   PUD (peptic ulcer disease)    TIA (transient ischemic attack)     Past Surgical History:  Procedure Laterality Date   A/V SHUNTOGRAM Left 03/19/2019   Procedure: A/V SHUNTOGRAM;  Surgeon: Katha Cabal, MD;  Location: [place] [facility];  Service: Cardiovascular;  Laterality: Left;   ABDOMINAL HYSTERECTOMY  1992   APPENDECTOMY  06/1990   AV FISTULA PLACEMENT  07/2009   left upper arm   AV FISTULA PLACEMENT Right 09/06/2016   Procedure: RIGHT FOREARM ARTERIOVENOUS (AV) GRAFT;  Surgeon: Elam Dutch, MD;  Location: [place];  Service: Vascular;  Laterality: Right;   AV FISTULA PLACEMENT N/A 02/24/2017   Procedure: INSERTION OF ARTERIOVENOUS (AV) GORE-TEX GRAFT ARM (BRACHIAL AXILLARY);  Surgeon: Katha Cabal, MD;  Location: [facility];  Service: Vascular;  Laterality: N/A;   [place] Right 09/06/2016   Procedure: REMOVAL OF Right Arm ARTERIOVENOUS GORETEX GRAFT and Vein Patch angioplasty of brachial artery;  Surgeon: Angelia Mould, MD;  Location: [place];  Service: Vascular;  Laterality: Right;   BIOPSY  09/26/2019   Procedure: BIOPSY;  Surgeon: Rogene Houston, MD;  [facility];  Service: Endoscopy;;   COLON RESECTION  1992   COLON SURGERY     COLONOSCOPY N/A 03/09/2019   Procedure: COLONOSCOPY;  Surgeon: Rogene Houston, MD;  [facility];  Service: Endoscopy;  Laterality: N/A;   COLONOSCOPY WITH PROPOFOL N/A 06/08/2021   Procedure: COLONOSCOPY WITH PROPOFOL;  Surgeon: Harvel Quale, MD;  [facility];  Service: Gastroenterology;  Laterality: N/A;  9:05 /Patient is on dialysis Mon Wed Fri   CORONARY ANGIOPLASTY WITH STENT PLACEMENT  12/15/11   "2"    CORONARY ANGIOPLASTY WITH STENT PLACEMENT  y/2013   "1; makes total of 3" (05/02/2012)   CORONARY ARTERY BYPASS GRAFT  06/13/2012   Procedure: CORONARY ARTERY BYPASS GRAFTING (CABG);  Surgeon: Grace Isaac, MD;  Location: [place];  Service: Open Heart Surgery;  Laterality: N/A;  cabg x four;  using left internal mammary artery, and left leg greater saphenous vein harvested endoscopically   CORONARY STENT INTERVENTION N/A 10/13/2016   Procedure: Coronary Stent Intervention;  Surgeon: Troy Sine, MD;  Location: [place] [facility];  Service: Cardiovascular;  Laterality: N/A;   DIALYSIS/PERMA CATHETER REMOVAL N/A 04/18/2017   Procedure: DIALYSIS/PERMA CATHETER REMOVAL;  Surgeon: Katha Cabal, MD;  Location: [place] [facility];  Service: Cardiovascular;  Laterality: N/A;   DILATION AND CURETTAGE OF UTERUS     ENTEROSCOPY N/A 06/08/2021   Procedure: PUSH ENTEROSCOPY;  Surgeon: Harvel Quale, MD;  [facility];  Service: Gastroenterology;  Laterality: N/A;   ESOPHAGOGASTRODUODENOSCOPY  01/20/2012   Procedure: ESOPHAGOGASTRODUODENOSCOPY (EGD);  Surgeon: Ladene Artist, MD,FACG;  Location: [facility] ENDOSCOPY;  Service: Endoscopy;  Laterality: N/A;   ESOPHAGOGASTRODUODENOSCOPY N/A 03/26/2013   Procedure: ESOPHAGOGASTRODUODENOSCOPY (EGD);  Surgeon: Irene Shipper, MD;  Location: [facility] END[facility]COPY;  Service: Endoscopy;  Laterality: N/A;   ESOPHAGOGASTRODUODENOSCOPY N/A 04/30/2015   Procedure: ESOPHAGOGASTRODUODENOSCOPY (EGD);  Surgeon: Rogene Houston, MD;  [facility];  Service: Endoscopy;  Laterality: N/A;  1pm - moved to 10/20 @ 1:10   ESOPHAGOGASTRODUODENOSCOPY N/A 07/29/2016   Procedure: ESOPHAGOGASTRODUODENOSCOPY (EGD);  Surgeon: Manus Gunning, MD;  Location: [name];  Service: Gastroenterology;  Laterality: N/A;  enteroscopy   ESOPHAGOGASTRODUODENOSCOPY N/A 09/26/2019   Procedure: ESOPHAGOGASTRODUODENOSCOPY (EGD);  Surgeon: Rogene Houston, MD;  [facility];  Service: Endoscopy;  Laterality: N/A;  1250   ESOPHAGOGASTRODUODENOSCOPY (EGD) WITH PROPOFOL N/A 02/05/2021   Procedure: ESOPHAGOGASTRODUODENOSCOPY (EGD) WITH PROPOFOL;  Surgeon: Eloise Harman, DO;  [facility];  Service: Endoscopy;  Laterality: N/A;   GIVENS CAPSULE STUDY N/A 03/07/2019   Procedure: GIVENS CAPSULE STUDY;  Surgeon: Rogene Houston, MD;  [facility];  Service: Endoscopy;  Laterality: N/A;  7:30   GIVENS CAPSULE STUDY N/A 04/22/2021   Procedure: GIVENS CAPSULE STUDY;  Surgeon: Rogene Houston, MD;  [facility];  Service: Endoscopy;  Laterality: N/A;  7:30   INSERTION OF DIALYSIS CATHETER N/A 10/05/2020   Procedure: ABORTED TUNNELED DIALYSIS CATHETER PLACEMENT RIGHT INTERNAL JUGULAR VEIN ;  Surgeon: Virl Cagey, MD;  [facility];  Service: General;  Laterality: N/A;   INTRAOPERATIVE TRANSESOPHAGEAL ECHOCARDIOGRAM  06/13/2012   Procedure: INTRAOPERATIVE TRANSESOPHAGEAL ECHOCARDIOGRAM;  Surgeon: Grace Isaac, MD;  Location: [place];  Service: Open Heart  Surgery;  Laterality: N/A;   IR DIALY SHUNT INTRO NEEDLE/INTRACATH INITIAL W/IMG LEFT Left 10/06/2020   IR FLUORO GUIDE CV LINE RIGHT  06/17/2020   IR GENERIC HISTORICAL  07/26/2016   IR FLUORO GUIDE CV LINE RIGHT 07/26/2016 Greggory Keen, MD [facility]   IR GENERIC HISTORICAL  07/26/2016   IR US GUIDE VASC ACCESS RIGHT 07/26/2016 Greggory Keen, MD [facility]   IR GENERIC HISTORICAL  08/02/2016   IR US GUIDE VASC ACCESS RIGHT 08/02/2016 Greggory Keen, MD [facility]   IR GENERIC HISTORICAL  08/02/2016   IR FLUORO GUIDE CV LINE RIGHT 08/02/2016 Greggory Keen, MD [facility]   IR RADIOLOGY PERIPHERAL GUIDED IV START  03/28/2017   IR REMOVAL TUN CV CATH W/O FL  08/11/2020   IR THROMBECTOMY AV FISTULA W/THROMBOLYSIS INC/SHUNT/IMG LEFT Left 06/17/2020   IR US GUIDE VASC ACCESS LEFT  06/17/2020   IR US GUIDE VASC ACCESS RIGHT  03/28/2017   IR US GUIDE VASC ACCESS RIGHT   06/17/2020   LEFT HEART CATH AND CORONARY ANGIOGRAPHY N/A 09/20/2016   Procedure: Left Heart Cath and Coronary Angiography;  Surgeon: [name], MD;  Location: [place] [facility];  Service: Cardiovascular;  Laterality: N/A;   LEFT HEART CATH AND CORS/GRAFTS ANGIOGRAPHY N/A 10/13/2016   Procedure: Left Heart Cath and Cors/Grafts Angiography;  Surgeon: Troy Sine, MD;  Location: [place] [facility];  Service: Cardiovascular;  Laterality: N/A;   LEFT HEART CATH AND CORS/GRAFTS ANGIOGRAPHY N/A 07/13/2018   Procedure: LEFT HEART CATH AND CORS/GRAFTS ANGIOGRAPHY;  Surgeon: Martinique, Peter M, MD;  Location: [place] [facility];  Service: Cardiovascular;  Laterality: N/A;   LEFT HEART CATH AND CORS/GRAFTS ANGIOGRAPHY N/A 07/22/2021   Procedure: LEFT HEART CATH AND CORS/GRAFTS ANGIOGRAPHY;  Surgeon: Lorretta Harp, MD;  Location: [place] [facility];  Service: Cardiovascular;  Laterality: N/A;   LEFT HEART CATHETERIZATION WITH CORONARY ANGIOGRAM N/A 12/15/2011   Procedure: LEFT HEART CATHETERIZATION WITH CORONARY ANGIOGRAM;  Surgeon: Burnell Blanks, MD;  Location: [facility] CATH LAB;  Service: Cardiovascular;  Laterality: N/A;   LEFT HEART CATHETERIZATION WITH CORONARY ANGIOGRAM N/A 01/10/2012   Procedure: LEFT HEART CATHETERIZATION WITH CORONARY ANGIOGRAM;  Surgeon: Peter M Martinique, MD;  Location: [facility] CATH LAB;  Service: Cardiovascular;  Laterality: N/A;   LEFT HEART CATHETERIZATION WITH CORONARY ANGIOGRAM N/A 06/08/2012   Procedure: LEFT HEART CATHETERIZATION WITH CORONARY ANGIOGRAM;  Surgeon: Burnell Blanks, MD;  Location: [facility] CATH LAB;  Service: Cardiovascular;  Laterality: N/A;   LEFT HEART CATHETERIZATION WITH CORONARY/GRAFT ANGIOGRAM N/A 12/10/2013   Procedure: LEFT HEART CATHETERIZATION WITH Beatrix Fetters;  Surgeon: [name], MD;  Location: [facility] CATH LAB;  Service: Cardiovascular;  Laterality: N/A;   OVARY SURGERY     ovarian cancer   POLYPECTOMY  03/09/2019   Procedure:  POLYPECTOMY;  Surgeon: Rogene Houston, MD;  [facility];  Service: Endoscopy;;  cecal    POLYPECTOMY N/A 09/26/2019   Procedure: DUODENAL POLYPECTOMY;  Surgeon: Rogene Houston, MD;  [facility];  Service: Endoscopy;  Laterality: N/A;   REVISION OF ARTERIOVENOUS GORETEX GRAFT N/A 02/24/2017   Procedure: REVISION OF ARTERIOVENOUS GORETEX GRAFT (RESECTION);  Surgeon: Katha Cabal, MD;  Location: [facility];  Service: Vascular;  Laterality: N/A;   REVISON OF ARTERIOVENOUS FISTULA Left 06/19/2020   Procedure: REVISION OF LEFT UPPER ARM AV GRAFT WITH INTERPOSITION JUMP GRAFT USING 6MM GORE LIMB;  Surgeon: Marty Heck, MD;  Location: [place];  Service: Vascular;  Laterality: Left;   SHUNTOGRAM N/A 10/15/2013   Procedure: Fistulogram;  Surgeon: Serafina Mitchell, MD;  Location: [facility] CATH LAB;  Service: Cardiovascular;  Laterality: N/A;   THROMBECTOMY / ARTERIOVENOUS GRAFT REVISION  2011   left upper arm   TUBAL LIGATION  1980's   UPPER EXTREMITY ANGIOGRAPHY Bilateral 12/06/2016   Procedure: Upper Extremity Angiography;  Surgeon: Katha Cabal, MD;  Location: [place] [facility];  Service: Cardiovascular;  Laterality: Bilateral;   UPPER EXTREMITY INTERVENTION Left 06/06/2017   Procedure: UPPER EXTREMITY INTERVENTION;  Surgeon: Katha Cabal, MD;  Location: [place] [facility];  Service: Cardiovascular;  Laterality: Left;    Family History  Problem Relation Age of Onset   Heart disease Mother        Heart Disease before age 28   Hyperlipidemia Mother    Hypertension Mother    Diabetes Mother    Heart attack Mother    Heart disease Father        Heart Disease before age 59   Hyperlipidemia Father    Hypertension Father    Diabetes Father    Diabetes Sister    Hypertension Sister    Diabetes Brother    Hyperlipidemia Brother    Heart attack Brother    Hypertension Sister    Heart attack Brother    Colon cancer Child 14   Other Other         noncontributory for early CAD   Esophageal cancer Neg Hx    Liver disease Neg Hx    Kidney disease Neg Hx    Colon polyps Neg Hx     Social History:  reports that she has never smoked. She has never used smokeless tobacco. She reports that she does not drink alcohol and does not use drugs.  Allergies:  Allergies  Allergen Reactions   Amlodipine Swelling   Aspirin Other (See Comments)    High Doses Mess up her stomach; "makes my bowels have blood in them". Takes 81 mg EC Aspirin    Nitrofurantoin Hives   Penicillins Other (See Comments)    SYNCOPE? , "makes me real weak when I take it; like I'll pass out"  Has patient had a PCN reaction causing immediate rash, facial/tongue/throat swelling, SOB or lightheadedness with hypotension: Yes Has patient had a PCN reaction causing severe rash involving mucus membranes or skin necrosis: no Has patient had a PCN reaction that required hospitalization no Has patient had a PCN reaction occurring within the last 10 years: no If all of the above    Bactrim [Sulfamethoxazole-Trimethoprim] Rash   Contrast Media [Iodinated Contrast Media] Itching   Iron Itching and Other (See Comments)    "they gave me iron in dialysis; had to give me Benadryl cause I had to have the iron" (05/02/2012)   Tylenol [Acetaminophen] Itching and Other (See Comments)    Makes her feet on fire per pt   Gabapentin Other (See Comments)    Unknown reaction   Iron Sucrose    Ranexa [Ranolazine] Other (See Comments)    Myoclonus-hospitalized    Sucroferric Oxyhydroxide    Dexilant [Dexlansoprazole] Other (See Comments)    Upset stomach   Hydralazine Itching    Has tolerated while inpatient Has tolerated while inpatient   Levaquin [Levofloxacin In D5w] Rash   Morphine And Related Itching and Other (See Comments)    Itching in feet   Pantoprazole Rash   Plavix [Clopidogrel Bisulfate] Rash   Protonix [Pantoprazole  Sodium] Rash   Venofer [Ferric Oxide] Itching and  Other (See Comments)    Patient reports using Benadryl prior to doses as [facility]    Medications: I have reviewed the patient's current medications.  Results for orders placed or performed during the hospital encounter of 08/15/21 (from the past 48 hour(s))  Basic metabolic panel     Status: Abnormal   Collection Time: 08/15/21 12:05 PM  Result Value Ref Range   Sodium 136 135 - 145 mmol/L   Potassium 3.3 (L) 3.5 - 5.1 mmol/L   Chloride 98 98 - 111 mmol/L   CO2 27 22 - 32 mmol/L   Glucose, Bld 173 (H) 70 - 99 mg/dL    Comment: Glucose reference range applies only to samples taken after fasting for at least 8 hours.   BUN 42 (H) 8 - 23 mg/dL   Creatinine, Ser 5.25 (H) 0.44 - 1.00 mg/dL   Calcium 9.3 8.9 - 10.3 mg/dL   GFR, Estimated 8 (L) >60 mL/min    Comment: (NOTE) Calculated using the CKD-EPI Creatinine Equation (2021)    Anion gap 11 5 - 15    Comment: Performed at [facility], [address], [place], [place] [address]  CBC     Status: Abnormal   Collection Time: 08/15/21 12:05 PM  Result Value Ref Range   WBC 5.5 4.0 - 10.5 K/uL    Comment: REPEATED TO VERIFY WHITE COUNT CONFIRMED ON SMEAR    RBC 2.66 (L) 3.87 - 5.11 MIL/uL   Hemoglobin 9.7 (L) 12.0 - 15.0 g/dL   HCT 31.2 (L) 36.0 - 46.0 %   MCV 117.3 (H) 80.0 - 100.0 fL   MCH 36.5 (H) 26.0 - 34.0 pg   MCHC 31.1 30.0 - 36.0 g/dL   RDW 19.8 (H) 11.5 - 15.5 %   Platelets PLATELET CLUMPS NOTED ON SMEAR 150 - 400 K/uL    Comment: SPECIMEN CHECKED FOR CLOTS Immature Platelet Fraction may be clinically indicated, consider ordering this additional test [id number] REPEATED TO VERIFY    nRBC 0.0 0.0 - 0.2 %    Comment: REPEATED TO VERIFY Performed at [facility], [address], [place], [place] [address]   Troponin I (High Sensitivity)     Status: Abnormal   Collection Time: 08/15/21 12:05 PM  Result Value Ref Range   Troponin I (High Sensitivity) 27 (H) <18 ng/L    Comment: (NOTE) Elevated high sensitivity  troponin I (hsTnI) values and significant  changes across serial measurements may suggest ACS but many other  chronic and acute conditions are known to elevate hsTnI results.  Refer to the "Links" section for chest pain algorithms and additional  guidance. Performed at [facility], [address], [place], [place] [address]   Differential     Status: None   Collection Time: 08/15/21 12:05 PM  Result Value Ref Range   Neutrophils Relative % 73 %   Neutro Abs 4.0 1.7 - 7.7 K/uL   Lymphocytes Relative 19 %   Lymphs Abs 1.0 0.7 - 4.0 K/uL   Monocytes Relative 5 %   Monocytes Absolute 0.3 0.1 - 1.0 K/uL   Eosinophils Relative 3 %   Eosinophils Absolute 0.2 0.0 - 0.5 K/uL   Basophils Relative 0 %   Basophils Absolute 0.0 0.0 - 0.1 K/uL   WBC Morphology TOXIC GRANULATION    RBC Morphology MACROCYTES SEEN    Smear Review PLATELET CLUMPS NOTED ON SMEAR, UNABLE TO ESTIMATE  Comment: Reviewed   Polychromasia PRESENT     Comment: Performed at [facility], [address], [place], [place] [address]  Troponin I (High Sensitivity)     Status: Abnormal   Collection Time: 08/15/21  2:01 PM  Result Value Ref Range   Troponin I (High Sensitivity) 27 (H) <18 ng/L    Comment: (NOTE) Elevated high sensitivity troponin I (hsTnI) values and significant  changes across serial measurements may suggest ACS but many other  chronic and acute conditions are known to elevate hsTnI results.  Refer to the "Links" section for chest pain algorithms and additional  guidance. Performed at [facility], [address], [place], [place] [address]   Magnesium     Status: None   Collection Time: 08/15/21  2:01 PM  Result Value Ref Range   Magnesium 1.9 1.7 - 2.4 mg/dL    Comment: Performed at [facility], [address], [place], [place] [address]  Resp Panel by RT-PCR (Flu A&B, Covid) Nasopharyngeal Swab     Status: None   Collection Time: 08/15/21  4:36 PM   Specimen: Nasopharyngeal Swab; Nasopharyngeal(NP)  swabs in vial transport medium  Result Value Ref Range   SARS Coronavirus 2 by RT PCR NEGATIVE NEGATIVE    Comment: (NOTE) SARS-CoV-2 target nucleic acids are NOT DETECTED.  The SARS-CoV-2 RNA is generally detectable in upper respiratory specimens during the acute phase of infection. The lowest concentration of SARS-CoV-2 viral copies this assay can detect is 138 copies/mL. A negative result does not preclude SARS-Cov-2 infection and should not be used as the sole basis for treatment or other patient management decisions. A negative result may occur with  improper specimen collection/handling, submission of specimen other than nasopharyngeal swab, presence of viral mutation(s) within the areas targeted by this assay, and inadequate number of viral copies(<138 copies/mL). A negative result must be combined with clinical observations, patient history, and epidemiological information. The expected result is Negative.  Fact Sheet for Patients:  EntrepreneurPulse.com.au  Fact Sheet for Healthcare Providers:  IncredibleEmployment.be  This test is no t yet approved or cleared by the [place] FDA and  has been authorized for detection and/or diagnosis of SARS-CoV-2 by FDA under an Emergency Use Authorization (EUA). This EUA will remain  in effect (meaning this test can be used) for the duration of the COVID-19 declaration under Section 564(b)(1) of the Act, 21 U.S.C.section 360bbb-3(b)(1), unless the authorization is terminated  or revoked sooner.       Influenza A by PCR NEGATIVE NEGATIVE   Influenza B by PCR NEGATIVE NEGATIVE    Comment: (NOTE) The Xpert Xpress SARS-CoV-2/FLU/RSV plus assay is intended as an aid in the diagnosis of influenza from Nasopharyngeal swab specimens and should not be used as a sole basis for treatment. Nasal washings and aspirates are unacceptable for Xpert Xpress SARS-CoV-2/FLU/RSV testing.  Fact Sheet for  Patients: EntrepreneurPulse.com.au  Fact Sheet for Healthcare Providers: IncredibleEmployment.be  This test is not yet approved or cleared by the [place] FDA and has been authorized for detection and/or diagnosis of SARS-CoV-2 by FDA under an Emergency Use Authorization (EUA). This EUA will remain in effect (meaning this test can be used) for the duration of the COVID-19 declaration under Section 564(b)(1) of the Act, 21 U.S.C. section 360bbb-3(b)(1), unless the authorization is terminated or revoked.  Performed at [facility], [address], [place], [place] [address]   Brain natriuretic peptide     Status: Abnormal   Collection Time: 08/15/21  8:40  PM  Result Value Ref Range   B Natriuretic Peptide 1,051.0 (H) 0.0 - 100.0 pg/mL    Comment: Performed at [facility], [address], [place], [place] [address]  Basic metabolic panel     Status: Abnormal   Collection Time: 08/16/21  5:11 AM  Result Value Ref Range   Sodium 140 135 - 145 mmol/L   Potassium 3.3 (L) 3.5 - 5.1 mmol/L   Chloride 103 98 - 111 mmol/L   CO2 25 22 - 32 mmol/L   Glucose, Bld 82 70 - 99 mg/dL    Comment: Glucose reference range applies only to samples taken after fasting for at least 8 hours.   BUN 53 (H) 8 - 23 mg/dL   Creatinine, Ser 6.87 (H) 0.44 - 1.00 mg/dL   Calcium 9.4 8.9 - 10.3 mg/dL   GFR, Estimated 6 (L) >60 mL/min    Comment: (NOTE) Calculated using the CKD-EPI Creatinine Equation (2021)    Anion gap 12 5 - 15    Comment: Performed at [facility], [address] [place], [place] [address]  CBC     Status: Abnormal   Collection Time: 08/16/21  5:11 AM  Result Value Ref Range   WBC 4.6 4.0 - 10.5 K/uL   RBC 2.41 (L) 3.87 - 5.11 MIL/uL   Hemoglobin 8.9 (L) 12.0 - 15.0 g/dL   HCT 27.8 (L) 36.0 - 46.0 %   MCV 115.4 (H) 80.0 - 100.0 fL   MCH 36.9 (H) 26.0 - 34.0 pg   MCHC 32.0 30.0 - 36.0 g/dL   RDW 19.9 (H) 11.5 - 15.5 %   Platelets 163 150 - 400  K/uL    Comment: Immature Platelet Fraction may be clinically indicated, consider ordering this additional test [id number]    nRBC 0.0 0.0 - 0.2 %    Comment: Performed at [facility], [address] [place], [place] [address]  Troponin I (High Sensitivity)     Status: Abnormal   Collection Time: 08/16/21  5:11 AM  Result Value Ref Range   Troponin I (High Sensitivity) 46 (H) <18 ng/L    Comment: (NOTE) Elevated high sensitivity troponin I (hsTnI) values and significant  changes across serial measurements may suggest ACS but many other  chronic and acute conditions are known to elevate hsTnI results.  Refer to the "Links" section for chest pain algorithms and additional  guidance. Performed at [facility], [address] [place], [place] [address]    *Note: Due to a large number of results and/or encounters for the requested time period, some results have not been displayed. A complete set of results can be found in Results Review.    DG Chest Port 1 View  Result Date: 08/15/2021 CLINICAL DATA:  Chest pain EXAM: PORTABLE CHEST 1 VIEW COMPARISON:  Chest x-rays dated 07/17/2021 and 03/29/2017. FINDINGS: Stable cardiomegaly. Median sternotomy wires appear intact and stable in alignment. Coarse lung markings bilaterally. No confluent opacity to suggest a superimposed pneumonia or pulmonary edema. No pleural effusion or pneumothorax is seen. No acute-appearing osseous abnormality. IMPRESSION: 1. No active disease. No evidence of pneumonia or pulmonary edema. 2. Stable cardiomegaly. 3. Coarse lung markings bilaterally, suspected chronic interstitial lung disease. Electronically Signed   By: Franki Cabot M.D.   On: 08/15/2021 12:22    ROS: feels fine now-  CP earlier - she thinks she has abdominal swelling  Blood pressure (!) 124/50, pulse (!) 117, temperature 98.6 F (37 C), temperature source Oral, resp. rate  18, height 5\' 1"  (1.549 m), weight 56 kg, SpO2 100 %. General appearance: alert  and no distress Resp: clear to auscultation bilaterally Cardio: regular rate and rhythm, S1, S2 normal, no murmur, click, rub or gallop GI: soft, non-tender; bowel sounds normal; no masses,  no organomegaly and she says her belly is fluid-  feels like just tissue Extremities: extremities normal, atraumatic, no cyanosis or edema Left upper arm AVG-  patent  Assessment/Plan: 81 year old BF with multiple medical issues and ESRD-  frequent hospitalizations 1 CP-  per cards-  just had a cardiac cath-  plan was for medical management-  on heparin currently  2 ESRD: MWF- [facility]-  have been having trouble with UF-  now on midodrine-  I really dont think she had volume on but she disagrees-  says has a skirt that she cannot button-  has been eating.  Will plan for HD today on schedule-  maybe try for a liter -  will do 3 k bath  3 Hypertension/vol: as above controversy over fluid-  I think EDW is set too low -  can continue midodrine for now 4. Anemia of ESRD: hgb drop ?  Overnight-  on heparin-  will treat with ESA 5. Metabolic Bone Disease: will continue home renvela -  dont know other routine meds   Louis Meckel 08/16/2021, 9:12 AM

## 2021-08-16 NOTE — Assessment & Plan Note (Addendum)
Patient with Combined systolic and diastolic dysfunction CHF---recent EF on echo is 30 to 35%, with grade 2 diastolic dysfunction - stable, compensated -Continue Bystolic Continue to use hemodialysis sessions to address volume status

## 2021-08-16 NOTE — Assessment & Plan Note (Addendum)
-  bystolic mostly for rate control -Continue midodrine discharge on hemodialysis days for pressure support -Cardiology consult appreciated

## 2021-08-16 NOTE — Progress Notes (Signed)
°  Amiodarone Drug - Drug Interaction Consult Note  Recommendations: Patient on rosuvastatin and tolerating, continue to monitor for and side effects Patient on metoprolol=> monitor BP and HR, notify MD for HR <50  Amiodarone is metabolized by the cytochrome P450 system and therefore has the potential to cause many drug interactions. Amiodarone has an average plasma half-life of 50 days (range 20 to 100 days).   There is potential for drug interactions to occur several weeks or months after stopping treatment and the onset of drug interactions may be slow after initiating amiodarone.   [x]  Statins: Increased risk of myopathy. Simvastatin- restrict dose to 20mg  daily. Other statins: counsel patients to report any muscle pain or weakness immediately.  []  Anticoagulants: Amiodarone can increase anticoagulant effect. Consider warfarin dose reduction. Patients should be monitored closely and the dose of anticoagulant altered accordingly, remembering that amiodarone levels take several weeks to stabilize.  []  Antiepileptics: Amiodarone can increase plasma concentration of phenytoin, the dose should be reduced. Note that small changes in phenytoin dose can result in large changes in levels. Monitor patient and counsel on signs of toxicity.  [x]  Beta blockers: increased risk of bradycardia, AV block and myocardial depression. Sotalol - avoid concomitant use.  []   Calcium channel blockers (diltiazem and verapamil): increased risk of bradycardia, AV block and myocardial depression.  []   Cyclosporine: Amiodarone increases levels of cyclosporine. Reduced dose of cyclosporine is recommended.  []  Digoxin dose should be halved when amiodarone is started.  []  Diuretics: increased risk of cardiotoxicity if hypokalemia occurs.  []  Oral hypoglycemic agents (glyburide, glipizide, glimepiride): increased risk of hypoglycemia. Patient's glucose levels should be monitored closely when initiating amiodarone  therapy.   []  Drugs that prolong the QT interval:  Torsades de pointes risk may be increased with concurrent use - avoid if possible.  Monitor QTc, also keep magnesium/potassium WNL if concurrent therapy can't be avoided.  Antibiotics: e.g. fluoroquinolones, erythromycin.  Antiarrhythmics: e.g. quinidine, procainamide, disopyramide, sotalol.  Antipsychotics: e.g. phenothiazines, haloperidol.   Lithium, tricyclic antidepressants, and methadone. Thank You,  Trenton Gammon, Season Astacio L  08/16/2021 1:50 PM

## 2021-08-16 NOTE — Consult Note (Signed)
Cardiology Consultation:   Patient ID: Shawna [name] MRN: [id number]; DOB: 03/10/1941  Admit date: 08/15/2021 Date of Consult: 08/16/2021  PCP:  [facility] [place] [facility] Cardiologist:  Carlyle Dolly, MD        Patient Profile:   Shawna [name] is a 81 y.o. female with a hx of CAD who is being seen 08/16/2021 for the evaluation of chest pain at the request of Dr. Roger Shelter.  History of Present Illness:   Shawna [name] female CAD (s/p CABG in 2013, DES to D1 in 10/2016, cath in 07/2018 showing patent grafts with occlusion of D1 at prior stent site and progression of PDA disease --> medical management recommended), HFrEF, HTN, HLD, paroxysmal atrial fibrillation (not on anticoagulation given history of GIB), NSVT, chronic anemia and ESRD    She was admitted to [facility] 07/2021 earlier this month for an NSTEMI and troponin values peaked at 449. Repeat echocardiogram showed her EF was reduced at 30 to 35% with the entire anterior wall, anterior septum, inferior septal segment and apex being akinetic. Work-up was intermittently postponed given her anemia she required 1 unit of pRBC's during admission. She was transferred to [facility] for a cardiac catheterization which showed her anatomy was overall similar to prior cath in 2020 with all grafts being patent and residual 75% stenosis along PDA with medical management recommended.  Imdur was titrated to 90 mg twice daily during admission and it was recommended to change Lopressor to Toprol-XL but it appears she was discharged on Lopressor 12.5 mg twice daily. Was recommended to consider adding Hydralazine as outpatient if BP allowed.   Patient saw Ms. Strader, PA-C 07/29/20 and chest pain resolved with NTG. Metoprolol changed to Toprol XL to hold dialysis days, BP too low to add hydralazine.  Patient admitted yest with recurrent chest pain, tachycardia HR 121, bP 88/54 EDP spoke with cards and Imdur and metoprolol held. Patient  had more chest pain this am-hurting left to right relieved with 4-5 NTG. Currently Afib/ST 115/m. Couldn't do dialysis yest with low BP. Was eating breakfast and now says chest pain beginning again.  Past Medical History:  Diagnosis Date   Acute on chronic respiratory failure with hypoxia ([place]) 10/10/2016   Anxiety    Arthritis    AVM (arteriovenous malformation) of colon    CAD (coronary artery disease)    a. s/p CABG in 2013 b. DES to D1 in 10/2016. c. cath in 07/2018 showing patent grafts with occlusion of D1 at prior stent site and progression of PDA disease --> medical management recommended   Carotid artery disease ([place])    a. 95-62% LICA, 07/3084    Chronic anemia    Chronic bronchitis (HCC)    Chronic diastolic CHF (congestive heart failure) ([place])    a. 02/2012 Echo EF 60-65%, nl wall motion, Gr 1 DD, mod MR   Colon cancer ([place]) 1992   Esophageal stricture    ESRD on hemodialysis ([place])    ESRD due to HTN, started dialysis 2011 and gets HD at [facility] with Dr Hinda Lenis on MWF schedule.  Access is LUA AVF as of Sept 2014.    GERD (gastroesophageal reflux disease)    High cholesterol 12/2011   History of blood transfusion 07/2011; 12/2011; 01/2012 X 2; 04/2012   History of gout    History of lower GI bleeding    Hypertension    Iron deficiency anemia    Jugular vein occlusion, right ([place])  Mitral regurgitation    a. Moderate by echo, 02/2012   Myocardial infarction [facility])    NSVT (nonsustained ventricular tachycardia)    Ovarian cancer ([place]) 1992   PAF (paroxysmal atrial fibrillation) ([place])    Pneumonia ~ 2009   PUD (peptic ulcer disease)    TIA (transient ischemic attack)     Past Surgical History:  Procedure Laterality Date   A/V SHUNTOGRAM Left 03/19/2019   Procedure: A/V SHUNTOGRAM;  Surgeon: Katha Cabal, MD;  Location: [place] CV [facility];  Service: Cardiovascular;  Laterality: Left;   ABDOMINAL HYSTERECTOMY  1992   APPENDECTOMY  06/1990   AV FISTULA PLACEMENT   07/2009   left upper arm   AV FISTULA PLACEMENT Right 09/06/2016   Procedure: RIGHT FOREARM ARTERIOVENOUS (AV) GRAFT;  Surgeon: Elam Dutch, MD;  Location: [place];  Service: Vascular;  Laterality: Right;   AV FISTULA PLACEMENT N/A 02/24/2017   Procedure: INSERTION OF ARTERIOVENOUS (AV) GORE-TEX GRAFT ARM (BRACHIAL AXILLARY);  Surgeon: Katha Cabal, MD;  Location: [facility] OR[facility];  Service: Vascular;  Laterality: N/A;   [place] Right 09/06/2016   Procedure: REMOVAL OF Right Arm ARTERIOVENOUS GORETEX GRAFT and Vein Patch angioplasty of brachial artery;  Surgeon: Angelia Mould, MD;  Location: [place];  Service: Vascular;  Laterality: Right;   BIOPSY  09/26/2019   Procedure: BIOPSY;  Surgeon: Rogene Houston, MD;  [facility];  Service: Endoscopy;;   COLON RESECTION  1992   COLON SURGERY     COLONOSCOPY N/A 03/09/2019   Procedure: COLONOSCOPY;  Surgeon: Rogene Houston, MD;  [facility];  Service: Endoscopy;  Laterality: N/A;   COLONOSCOPY WITH PROPOFOL N/A 06/08/2021   Procedure: COLONOSCOPY WITH PROPOFOL;  Surgeon: Harvel Quale, MD;  [facility];  Service: Gastroenterology;  Laterality: N/A;  9:05 /Patient is on dialysis Mon Wed Fri   CORONARY ANGIOPLASTY WITH STENT PLACEMENT  12/15/11   "2"   CORONARY ANGIOPLASTY WITH STENT PLACEMENT  y/2013   "1; makes total of 3" (05/02/2012)   CORONARY ARTERY BYPASS GRAFT  06/13/2012   Procedure: CORONARY ARTERY BYPASS GRAFTING (CABG);  Surgeon: Grace Isaac, MD;  Location: [place];  Service: Open Heart Surgery;  Laterality: N/A;  cabg x four;  using left internal mammary artery, and left leg greater saphenous vein harvested endoscopically   CORONARY STENT INTERVENTION N/A 10/13/2016   Procedure: Coronary Stent Intervention;  Surgeon: Troy Sine, MD;  Location: [place] [facility];  Service: Cardiovascular;  Laterality: N/A;   DIALYSIS/PERMA CATHETER REMOVAL N/A 04/18/2017   Procedure:  DIALYSIS/PERMA CATHETER REMOVAL;  Surgeon: Katha Cabal, MD;  Location: [place] [facility];  Service: Cardiovascular;  Laterality: N/A;   DILATION AND CURETTAGE OF UTERUS     ENTEROSCOPY N/A 06/08/2021   Procedure: PUSH ENTEROSCOPY;  Surgeon: Harvel Quale, MD;  [facility];  Service: Gastroenterology;  Laterality: N/A;   ESOPHAGOGASTRODUODENOSCOPY  01/20/2012   Procedure: ESOPHAGOGASTRODUODENOSCOPY (EGD);  Surgeon: Ladene Artist, MD,FACG;  Location: [facility] ENDOSCOPY;  Service: Endoscopy;  Laterality: N/A;   ESOPHAGOGASTRODUODENOSCOPY N/A 03/26/2013   Procedure: ESOPHAGOGASTRODUODENOSCOPY (EGD);  Surgeon: Irene Shipper, MD;  Location: [facility] ENDOSCOPY;  Service: Endoscopy;  Laterality: N/A;   ESOPHAGOGASTRODUODENOSCOPY N/A 04/30/2015   Procedure: ESOPHAGOGASTRODUODENOSCOPY (EGD);  Surgeon: Rogene Houston, MD;  [facility];  Service: Endoscopy;  Laterality: N/A;  1pm - moved to 10/20 @ 1:10   ESOPHAGOGASTRODUODENOSCOPY N/A 07/29/2016   Procedure: ESOPHAGOGASTRODUODENOSCOPY (  EGD);  Surgeon: Manus Gunning, MD;  Location: [place];  Service: Gastroenterology;  Laterality: N/A;  enteroscopy   ESOPHAGOGASTRODUODENOSCOPY N/A 09/26/2019   Procedure: ESOPHAGOGASTRODUODENOSCOPY (EGD);  Surgeon: Rogene Houston, MD;  [facility];  Service: Endoscopy;  Laterality: N/A;  1250   ESOPHAGOGASTRODUODENOSCOPY (EGD) WITH PROPOFOL N/A 02/05/2021   Procedure: ESOPHAGOGASTRODUODENOSCOPY (EGD) WITH PROPOFOL;  Surgeon: Eloise Harman, DO;  [facility];  Service: Endoscopy;  Laterality: N/A;   GIVENS CAPSULE STUDY N/A 03/07/2019   Procedure: GIVENS CAPSULE STUDY;  Surgeon: Rogene Houston, MD;  [facility];  Service: Endoscopy;  Laterality: N/A;  7:30   GIVENS CAPSULE STUDY N/A 04/22/2021   Procedure: GIVENS CAPSULE STUDY;  Surgeon: Rogene Houston, MD;  [facility];  Service: Endoscopy;  Laterality: N/A;  7:30   INSERTION  OF DIALYSIS CATHETER N/A 10/05/2020   Procedure: ABORTED TUNNELED DIALYSIS CATHETER PLACEMENT RIGHT INTERNAL JUGULAR VEIN ;  Surgeon: Virl Cagey, MD;  [facility];  Service: General;  Laterality: N/A;   INTRAOPERATIVE TRANSESOPHAGEAL ECHOCARDIOGRAM  06/13/2012   Procedure: INTRAOPERATIVE TRANSESOPHAGEAL ECHOCARDIOGRAM;  Surgeon: Grace Isaac, MD;  Location: [place];  Service: Open Heart Surgery;  Laterality: N/A;   IR DIALY SHUNT INTRO NEEDLE/INTRACATH INITIAL W/IMG LEFT Left 10/06/2020   IR FLUORO GUIDE CV LINE RIGHT  06/17/2020   IR GENERIC HISTORICAL  07/26/2016   IR FLUORO GUIDE CV LINE RIGHT 07/26/2016 Greggory Keen, MD [facility]   IR GENERIC HISTORICAL  07/26/2016   IR US GUIDE VASC ACCESS RIGHT 07/26/2016 Greggory Keen, MD [facility]   IR GENERIC HISTORICAL  08/02/2016   IR US GUIDE VASC ACCESS RIGHT 08/02/2016 Greggory Keen, MD [facility]   IR GENERIC HISTORICAL  08/02/2016   IR FLUORO GUIDE CV LINE RIGHT 08/02/2016 Greggory Keen, MD [facility]   IR RADIOLOGY PERIPHERAL GUIDED IV START  03/28/2017   IR REMOVAL TUN CV CATH W/O FL  08/11/2020   IR THROMBECTOMY AV FISTULA W/THROMBOLYSIS INC/SHUNT/IMG LEFT Left 06/17/2020   IR US GUIDE VASC ACCESS LEFT  06/17/2020   IR US GUIDE VASC ACCESS RIGHT  03/28/2017   IR US GUIDE VASC ACCESS RIGHT  06/17/2020   LEFT HEART CATH AND CORONARY ANGIOGRAPHY N/A 09/20/2016   Procedure: Left Heart Cath and Coronary Angiography;  Surgeon: [name], MD;  Location: [place] [facility];  Service: Cardiovascular;  Laterality: N/A;   LEFT HEART CATH AND CORS/GRAFTS ANGIOGRAPHY N/A 10/13/2016   Procedure: Left Heart Cath and Cors/Grafts Angiography;  Surgeon: Troy Sine, MD;  Location: [place] [facility];  Service: Cardiovascular;  Laterality: N/A;   LEFT HEART CATH AND CORS/GRAFTS ANGIOGRAPHY N/A 07/13/2018   Procedure: LEFT HEART CATH AND CORS/GRAFTS ANGIOGRAPHY;  Surgeon: Martinique, Peter M, MD;  Location: [place] [facility];  Service:  Cardiovascular;  Laterality: N/A;   LEFT HEART CATH AND CORS/GRAFTS ANGIOGRAPHY N/A 07/22/2021   Procedure: LEFT HEART CATH AND CORS/GRAFTS ANGIOGRAPHY;  Surgeon: Lorretta Harp, MD;  Location: [place] [facility];  Service: Cardiovascular;  Laterality: N/A;   LEFT HEART CATHETERIZATION WITH CORONARY ANGIOGRAM N/A 12/15/2011   Procedure: LEFT HEART CATHETERIZATION WITH CORONARY ANGIOGRAM;  Surgeon: Burnell Blanks, MD;  Location: [facility] CATH LAB;  Service: Cardiovascular;  Laterality: N/A;   LEFT HEART CATHETERIZATION WITH CORONARY ANGIOGRAM N/A 01/10/2012   Procedure: LEFT HEART CATHETERIZATION WITH CORONARY ANGIOGRAM;  Surgeon: Peter M Martinique, MD;  Location: [facility] CATH LAB;  Service: Cardiovascular;  Laterality: N/A;   LEFT HEART CATHETERIZATION WITH CORONARY ANGIOGRAM N/A 06/08/2012   Procedure: LEFT HEART CATHETERIZATION WITH CORONARY ANGIOGRAM;  Surgeon: Burnell Blanks, MD;  Location: [facility] CATH LAB;  Service: Cardiovascular;  Laterality: N/A;   LEFT HEART CATHETERIZATION WITH CORONARY/GRAFT ANGIOGRAM N/A 12/10/2013   Procedure: LEFT HEART CATHETERIZATION WITH Beatrix Fetters;  Surgeon: [name], MD;  Location: [facility] CATH LAB;  Service: Cardiovascular;  Laterality: N/A;   OVARY SURGERY     ovarian cancer   POLYPECTOMY  03/09/2019   Procedure: POLYPECTOMY;  Surgeon: Rogene Houston, MD;  [facility];  Service: Endoscopy;;  cecal    POLYPECTOMY N/A 09/26/2019   Procedure: DUODENAL POLYPECTOMY;  Surgeon: Rogene Houston, MD;  [facility];  Service: Endoscopy;  Laterality: N/A;   REVISION OF ARTERIOVENOUS GORETEX GRAFT N/A 02/24/2017   Procedure: REVISION OF ARTERIOVENOUS GORETEX GRAFT (RESECTION);  Surgeon: Katha Cabal, MD;  Location: [facility];  Service: Vascular;  Laterality: N/A;   REVISON OF ARTERIOVENOUS FISTULA Left 06/19/2020   Procedure: REVISION OF LEFT UPPER ARM AV GRAFT WITH INTERPOSITION JUMP GRAFT USING 6MM GORE LIMB;  Surgeon: Marty Heck, MD;  Location: [place];  Service: Vascular;  Laterality: Left;   SHUNTOGRAM N/A 10/15/2013   Procedure: Fistulogram;  Surgeon: Serafina Mitchell, MD;  Location: [facility] CATH LAB;  Service: Cardiovascular;  Laterality: N/A;   THROMBECTOMY / ARTERIOVENOUS GRAFT REVISION  2011   left upper arm   TUBAL LIGATION  1980's   UPPER EXTREMITY ANGIOGRAPHY Bilateral 12/06/2016   Procedure: Upper Extremity Angiography;  Surgeon: Katha Cabal, MD;  Location: [place] [facility];  Service: Cardiovascular;  Laterality: Bilateral;   UPPER EXTREMITY INTERVENTION Left 06/06/2017   Procedure: UPPER EXTREMITY INTERVENTION;  Surgeon: Katha Cabal, MD;  Location: [place] [facility];  Service: Cardiovascular;  Laterality: Left;     Home Medications:  Prior to Admission medications   Medication Sig Start Date End Date Taking? Authorizing Provider  ALPRAZolam Duanne Moron) 0.5 MG tablet Take 0.5 mg by mouth at bedtime. 03/20/21  Yes [provider]  amiodarone (PACERONE) 200 MG tablet Take 1 tablet (200 mg total) by mouth daily. 05/13/21  Yes [name], Fransisco Hertz, PA-C  aspirin EC 81 MG tablet Take 81 mg by mouth daily.  09/27/19  Yes Rehman, Mechele Dawley, MD  hydrOXYzine (VISTARIL) 100 MG capsule Take 100 mg by mouth daily as needed for anxiety. 11/23/20  Yes [provider]  isosorbide mononitrate (IMDUR) 30 MG 24 hr tablet Take 3 tablets (90 mg total) by mouth 2 (two) times daily. 07/24/21  Yes Hosie Poisson, MD  levothyroxine (SYNTHROID) 25 MCG tablet Take 1 tablet (25 mcg total) by mouth daily. 02/02/21  Yes Brita Romp, NP  lidocaine-prilocaine (EMLA) cream Apply 1 application topically every Monday, Wednesday, and Friday. Prior to dialysis   Yes [provider]  loperamide (IMODIUM A-D) 2 MG tablet Take 2 tablets (4 mg total) by mouth daily as needed for diarrhea or loose stools. 12/17/20  Yes Thurnell Lose, MD  metoprolol succinate (TOPROL XL) 25 MG 24 hr tablet Take 1  tablet (25 mg total) by mouth daily. 07/29/21  Yes [name], Fransisco Hertz, PA-C  midodrine (PROAMATINE) 5 MG tablet Take Monday, Wednesday, Friday morning BEFORE dialysis 12/22/20  Yes Branch, Alphonse Guild, MD  multivitamin (RENA-VIT) TABS tablet Take 1 tablet by mouth daily.   Yes [provider]  nitroGLYCERIN (NITROSTAT) 0.4 MG SL tablet Place  1 tablet (0.4 mg total) under the tongue every 5 (five) minutes x 3 doses as needed for chest pain (if no relief after 2nd dose, proceed to the ED for an evaluation or call 911). 02/19/21  Yes Verta Ellen., NP  omeprazole (PRILOSEC) 20 MG capsule Take 1 capsule (20 mg total) by mouth daily. 12/01/20  Yes Rehman, Mechele Dawley, MD  rosuvastatin (CRESTOR) 10 MG tablet Take 1 tablet (10 mg total) by mouth daily. 05/13/21  Yes [name], [place] M, PA-C  sevelamer carbonate (RENVELA) 800 MG tablet Take 3 tablets (2,400 mg total) by mouth 3 (three) times daily with meals. 07/08/21  Yes Shelly Coss, MD    Inpatient Medications: Scheduled Meds:  amiodarone  200 mg Oral Daily   aspirin EC  81 mg Oral Daily   levothyroxine  25 mcg Oral Daily   midodrine  10 mg Oral BID WC   rosuvastatin  10 mg Oral Daily   sevelamer carbonate  2,400 mg Oral TID WC   Continuous Infusions:  heparin 600 Units/hr (08/16/21 0807)   PRN Meds: nitroGLYCERIN, polyethylene glycol  Allergies:    Allergies  Allergen Reactions   Amlodipine Swelling   Aspirin Other (See Comments)    High Doses Mess up her stomach; "makes my bowels have blood in them". Takes 81 mg EC Aspirin    Nitrofurantoin Hives   Penicillins Other (See Comments)    SYNCOPE? , "makes me real weak when I take it; like I'll pass out"  Has patient had a PCN reaction causing immediate rash, facial/tongue/throat swelling, SOB or lightheadedness with hypotension: Yes Has patient had a PCN reaction causing severe rash involving mucus membranes or skin necrosis: no Has patient had a PCN reaction that required  hospitalization no Has patient had a PCN reaction occurring within the last 10 years: no If all of the above    Bactrim [Sulfamethoxazole-Trimethoprim] Rash   Contrast Media [Iodinated Contrast Media] Itching   Iron Itching and Other (See Comments)    "they gave me iron in dialysis; had to give me Benadryl cause I had to have the iron" (05/02/2012)   Tylenol [Acetaminophen] Itching and Other (See Comments)    Makes her feet on fire per pt   Gabapentin Other (See Comments)    Unknown reaction   Iron Sucrose    Ranexa [Ranolazine] Other (See Comments)    Myoclonus-hospitalized    Sucroferric Oxyhydroxide    Dexilant [Dexlansoprazole] Other (See Comments)    Upset stomach   Hydralazine Itching    Has tolerated while inpatient Has tolerated while inpatient   Levaquin [Levofloxacin In D5w] Rash   Morphine And Related Itching and Other (See Comments)    Itching in feet   Pantoprazole Rash   Plavix [Clopidogrel Bisulfate] Rash   Protonix [Pantoprazole Sodium] Rash   Venofer [Ferric Oxide] Itching and Other (See Comments)    Patient reports using Benadryl prior to doses as [place] History:   Social History   Socioeconomic History   Marital status: Married    Spouse name: Georgine Wiltse   Number of children: 1   Years of education: Not on file   Highest education level: GED or equivalent  Occupational History   Occupation: retired- Technical sales engineer- [employer]  Tobacco Use   Smoking status: Never   Smokeless tobacco: Never  Vaping Use   Vaping Use: Never used  Substance and Sexual Activity   Alcohol use: No  Alcohol/week: 0.0 standard drinks   Drug use: No   Sexual activity: Not Currently    Birth control/protection: Surgical  Other Topics Concern   Not on file  Social History Narrative   Lives in [place], [place] with husband.  Dialysis pt - [facility].   Social Determinants of Health   Financial Resource Strain: Not on file  Food Insecurity: Not on  file  Transportation Needs: Not on file  Physical Activity: Not on file  Stress: Not on file  Social Connections: Not on file  Intimate Partner Violence: Not on file    Family History:     Family History  Problem Relation Age of Onset   Heart disease Mother        Heart Disease before age 3   Hyperlipidemia Mother    Hypertension Mother    Diabetes Mother    Heart attack Mother    Heart disease Father        Heart Disease before age 38   Hyperlipidemia Father    Hypertension Father    Diabetes Father    Diabetes Sister    Hypertension Sister    Diabetes Brother    Hyperlipidemia Brother    Heart attack Brother    Hypertension Sister    Heart attack Brother    Colon cancer Child 56   Other Other        noncontributory for early CAD   Esophageal cancer Neg Hx    Liver disease Neg Hx    Kidney disease Neg Hx    Colon polyps Neg Hx      ROS:  Please see the history of present illness.  Review of Systems  Constitutional: Negative.  HENT: Negative.    Eyes: Negative.   Cardiovascular:  Positive for chest pain.  Respiratory: Negative.    Hematologic/Lymphatic: Negative.   Musculoskeletal:  Positive for muscle weakness. Negative for joint pain.  Gastrointestinal: Negative.   Genitourinary: Negative.   Neurological: Negative.    All other ROS reviewed and negative.     Physical Exam/Data:   Vitals:   08/16/21 0106 08/16/21 0537 08/16/21 0649 08/16/21 0909  BP: (!) 120/54 (!) 117/57 (!) 108/58 (!) 124/50  Pulse: 98 (!) 109 (!) 116 (!) 117  Resp: 16 16 18 18   Temp: 97.9 F (36.6 C) 98.4 F (36.9 C) 98.8 F (37.1 C) 98.6 F (37 C)  TempSrc:   Oral Oral  SpO2: 99% 99% 100% 100%  Weight:  56 kg    Height:        Intake/Output Summary (Last 24 hours) at 08/16/2021 0912 Last data filed at 08/16/2021 5809 Gross per 24 hour  Intake 240 ml  Output --  Net 240 ml   Last 3 Weights 08/16/2021 08/15/2021 08/15/2021  Weight (lbs) 123 lb 7.3 oz 123 lb 9.6 oz 128 lb 15.5  oz  Weight (kg) 56 kg 56.065 kg 58.5 kg     Body mass index is 23.33 kg/m.  General: Thin, elderly, in no acute distress  HEENT: normal Neck: no JVD Vascular: No carotid bruits; Distal pulses 2+ bilaterally Cardiac:  normal S1, S2; RRR; 1/6 systolic murmur apex Lungs:  clear to auscultation bilaterally, no wheezing, rhonchi or rales  Abd: soft, nontender, no hepatomegaly  Ext: no edema Musculoskeletal:  No deformities, BUE and BLE strength normal and equal Skin: warm and dry  Neuro:  CNs 2-12 intact, no focal abnormalities noted Psych:  Normal affect   EKG:  The EKG was personally reviewed  and demonstrates:  Sinus tach 115/m LBBB, LVH Telemetry:  Telemetry was personally reviewed and demonstrates:  Afib/ST 115/m  Relevant CV Studies:   Echocardiogram: 07/2021 IMPRESSIONS     1. Left ventricular ejection fraction, by estimation, is 30 to 35%. The  left ventricle has moderately decreased function. The left ventricle  demonstrates regional wall motion abnormalities (see scoring  diagram/findings for description). There is mild  left ventricular hypertrophy. Left ventricular diastolic parameters are  consistent with Grade II diastolic dysfunction (pseudonormalization).  Elevated left atrial pressure.   2. Right ventricular systolic function is moderately reduced. The right  ventricular size is normal. There is mildly elevated pulmonary artery  systolic pressure. The estimated right ventricular systolic pressure is  15.4 mmHg.   3. Left atrial size was severely dilated.   4. Right atrial size was mildly dilated.   5. The mitral valve is degenerative. Moderate mitral valve regurgitation.  Mild to moderate mitral stenosis. MG 42mmHg at 70bpm, MVA 1.2 cm^2 by  continuity equation. Moderate mitral annular calcification.   6. Tricuspid valve regurgitation is mild to moderate.   7. The aortic valve is tricuspid. Aortic valve regurgitation is not  visualized. Aortic valve  sclerosis/calcification is present, without any  evidence of aortic stenosis.   8. The inferior vena cava is normal in size with greater than 50%  respiratory variability, suggesting right atrial pressure of 3 mmHg.      LHC: 07/2021 Mid LM to Dist LM lesion is 60% stenosed.   Prox LAD lesion is 90% stenosed.   Ost Cx to Mid Cx lesion is 99% stenosed.   Ost RCA to Prox RCA lesion is 100% stenosed.   Ost Ramus to Ramus lesion is 80% stenosed.   Ost 1st Diag to 1st Diag lesion is 100% stenosed.   Ost RPDA to RPDA lesion is 75% stenosed.   2nd Mrg lesion is 60% stenosed.   Dist LAD lesion is 70% stenosed.   LIMA and is large.   SVG and is large.   SVG and is normal in caliber.   SVG and is large.   The graft exhibits no disease.   The graft exhibits no disease.   The graft exhibits no disease.   LV end diastolic pressure is mildly elevated.   The left ventricular ejection fraction is 35-45% by visual estimate.    IMPRESSION: Ms. Faught anatomy is for all intents and purposes unchanged from her prior cath performed by Dr. Martinique in 2020.  All her grafts are patent.  Her LVEDP was 18.  Her EF looks to be in the 40 to 45% range with an anteroapical wall motion abnormality.  I suspect that her chest pain and mild enzyme leak was related to "demand ischemia".  Medical therapy will be recommended.  If femoral angiogram was performed and the stick appeared to be slightly high and therefore Mynx closure was not performed.  The patient will need a manual hold in the recovery bay prior to going to the floor.  Dr. Domenic Polite, the patient's attending cardiologist, was notified of these results.   Laboratory Data:  High Sensitivity Troponin:   Recent Labs  Lab 08/15/21 1205 08/15/21 1401 08/16/21 0511  TROPONINIHS 27* 27* 46*     Chemistry Recent Labs  Lab 08/15/21 1205 08/15/21 1401 08/16/21 0511  NA 136  --  140  K 3.3*  --  3.3*  CL 98  --  103  CO2 27  --  25  GLUCOSE 173*  --  82   BUN 42*  --  53*  CREATININE 5.25*  --  6.87*  CALCIUM 9.3  --  9.4  MG  --  1.9  --   GFRNONAA 8*  --  6*  ANIONGAP 11  --  12    No results for input(s): PROT, ALBUMIN, AST, ALT, ALKPHOS, BILITOT in the last 168 hours. Lipids No results for input(s): CHOL, TRIG, HDL, LABVLDL, LDLCALC, CHOLHDL in the last 168 hours.  Hematology Recent Labs  Lab 08/15/21 1205 08/16/21 0511  WBC 5.5 4.6  RBC 2.66* 2.41*  HGB 9.7* 8.9*  HCT 31.2* 27.8*  MCV 117.3* 115.4*  MCH 36.5* 36.9*  MCHC 31.1 32.0  RDW 19.8* 19.9*  PLT PLATELET CLUMPS NOTED ON SMEAR 163   Thyroid No results for input(s): TSH, FREET4 in the last 168 hours.  BNP Recent Labs  Lab 08/15/21 2040  BNP 1,051.0*    DDimer No results for input(s): DDIMER in the last 168 hours.   Radiology/Studies:  DG Chest Port 1 View  Result Date: 08/15/2021 CLINICAL DATA:  Chest pain EXAM: PORTABLE CHEST 1 VIEW COMPARISON:  Chest x-rays dated 07/17/2021 and 03/29/2017. FINDINGS: Stable cardiomegaly. Median sternotomy wires appear intact and stable in alignment. Coarse lung markings bilaterally. No confluent opacity to suggest a superimposed pneumonia or pulmonary edema. No pleural effusion or pneumothorax is seen. No acute-appearing osseous abnormality. IMPRESSION: 1. No active disease. No evidence of pneumonia or pulmonary edema. 2. Stable cardiomegaly. 3. Coarse lung markings bilaterally, suspected chronic interstitial lung disease. Electronically Signed   By: Franki Cabot M.D.   On: 08/15/2021 12:22     Assessment and Plan:    Recurrent chest pain relieved with NTG. Known CAD - She is s/p CABG in 2013, DES to D1 in 10/2016 and cath in 07/2018 showing patent grafts with occlusion of D1 at prior stent site and progression of PDA disease with medical management recommended. Repeat catheterization 07/2021 showed patent grafts with known PDA disease and overall similar to her prior cath in 07/2018. Metoprolol and Imdur held on admission due  to hypotension. She is tachycardia which may be contributing to chest pain. Troponins flat [id number].     HFrEF - Her EF was previously at 50% in 03/2021 but reduced at 30-35% by repeat echo earlier this month. Her volume status is managed by HD but couldn't do yest b/c of hypotension. BNP 1051 but doesn't appear to be in CHF. No ACE-I/ARB/ARNI/Spiro/SGLT2 inhibitor given her CKD.     Paroxysmal atrial fibrillation - She does continue to experience intermittent palpitations but no persistent symptoms.  On Amiodarone 200mg  daily but HR still fast 115/m while metoprolol on hold for hypotension. She has not been on anticoagulation given history of GIB and due to her chronic anemia. K 3.3   replaced, Mg 1.9. would try to restart metoprolol.    Anemia - She did require a transfusion during her recent admission but says her Hgb 8.9.     HLD - She was transitioned from Simvastatin to Crestor during her admission and I have asked her to make sure she is taking this. LDL was at 48 on 07/16/2021.     ESRD - On HD - MWF schedule.   Risk Assessment/Risk Scores:     HEAR Score (for undifferentiated chest pain):       CHA2DS2-VASc Score =     This indicates a  % annual risk of stroke. The patient's score is based upon:  For questions or updates, please contact [place] Please consult [email or web address] for contact info under    Signed, Ermalinda Barrios, PA-C  08/16/2021 9:12 AM

## 2021-08-16 NOTE — Progress Notes (Signed)
PROGRESS NOTE    Patient: Shawna [name]                            PCP: [facility]                    DOB: [birth year]            DOA: 08/15/2021 MPN:[id number]             DOS: 08/16/2021, 12:04 PM   LOS: 0 days   Date of Service: The patient was seen and examined on 08/16/2021  Subjective:   The patient was seen and examined this morning. Hemodynamically stable, blood pressure has improved. Tachycardic heart rate 114 Reporting of abdominal distention due to fluid, needing hemodialysis Reporting improved chest pain Otherwise no issues overnight .  Brief Narrative:   Per HPI:  Shawna [name] is a 81 y.o. female with medical history significant for ESRD, systolic and diastolic CHF, atrial fibrillation, ESRD, CABG, diabetes mellitus, GI bleed. Patient presented to the ED with complaints of onset of chest pain this morning.   She reports chest pain on the right side and moved to the left.  No radiation, or SOB. Not worse with deep breathing or cough.   She goes for dialysis MWF, but the last few sessions they have not been able to pull off enough fluid due to her low blood pressure.  She reports the midodrine helps a bit.  She had to go again yesterday Saturday but they were able to pull just 1 L.  She feels her abdomen is bloated. She does not know her dry weight.  She does not make any urine.   She reports prior similar chest pains about 3 weeks ago, which she was admitted to the hospital 1/5-1/14 NSTEMI, started on heparin drip initially and was discontinued due to drop in hemoglobin, cardiac cath was done which showed-unchanged anatomy compared to her prior cath in 2020, patent grafts.  It was felt that her chest pain and mild enzyme leak (Troponin elevated 449) was secondary to demand ischemia, and medical therapy was recommended. She required 1 unit PRBC of blood.   ED Course:  Tachycardic HR 112-121.  RR 14- 30.  BP down to 88/54, subsequently improved systolic now  008Q. Troponin 27 x 2.  EKG without significant changes from prior.  Potassium 3.3.   Chest x-ray clear. EDP talked to cardiology, recommended d/c Imdur, metoprolol Admitted for Hypotension, Tachycardia and chest pain    Assessment & Plan:   Principal Problem:   Chest pain Active Problems:   HLD (hyperlipidemia)   Anemia in chronic kidney disease   Hx of CABG   Carotid artery disease (HCC)   Chronic diastolic CHF (congestive heart failure) (HCC)   Essential hypertension   ESRD (end stage renal disease) on dialysis (HCC)   Hypokalemia   Atrial fibrillation (HCC)     Assessment and Plan: * Chest pain- (present on admission) with hypotension, tachycardia- sinus vs atria tachycardia.  Systolic down to 88.  Tachycardic heart rates in the 110s to 120s persistently-May be reactive due to hypotension.  History of CABG, recent cardiac cath stable findings from prior, patent grafts.  Troponin today flat at 27.  EKG without significant changes. - EDP talked to Dr. Harl Bowie, recommended d/c her metoprolol, Imdur -Resume Crestor, aspirin 81 mg daily -Resume midodrine  Atrial fibrillation ([place])- (present on admission) - currently in sinus  rhythm, but tachycardic.  -  Not on anticoagulation, due to chronic anemia. -She is unable to confirm to me if she takes amiodarone listed on her medication list, per last cardiology notes she is to be on amiodarone, will resume - D/c metoprolol, tachycardia may worsen -Cardiology consulted, appreciate input  Hypokalemia- (present on admission) - Monitoring and repleting orally -Place potassium 3.3, 20 mg KCl will be given -Magnesium 1.9  ESRD (end stage renal disease) on dialysis [facility]) - On hemodialysis Monday Wednesdays and Fridays -Has been hypotensive, last hemodialysis over the weekend, 1 L of fluid extracted -Nephrologist consulted -Anticipating hemodialysis today -Difficulty with hemodialysis with hyponatremia, on midodrine  titrating  Essential hypertension- (present on admission) - Currently hypotensive, discontinue BP meds -Resuming midodrine  Chronic diastolic CHF (congestive heart failure) ([place])- (present on admission) - On hemodialysis, monitoring daily weight -Continue current meds  Carotid artery disease ([place])- (present on admission) - Currently stable, improved chest pain -Continue current meds  Hx of CABG - Chest pain, cardiac enzymes remain flat -Continue home medication with exception of discontinuing metoprolol and Imdur  Anemia in chronic kidney disease- (present on admission) - Anemia of chronic disease due to CKD, -Monitoring H&H closely, since patient is symptomatic hypotensive, -Discussed with nephrology if patient will need blood transfusion -No significant drop in hemoglobin noted  HLD (hyperlipidemia)- (present on admission) - Continue statins         DVT prophylaxis:  SCDs   Code Status:   Code Status: Full Code  Family Communication: No family member present at bedside- attempt will be made to update daily The above findings and plan of care has been discussed with patient (and family)  in detail,  they expressed understanding and agreement of above. -Advance care planning has been discussed.   Admission status:   Status is: Inpatient Remains inpatient appropriate because: Continue work-up regarding chest pain, hypertension, needing hemodialysis  Disposition: From home, likely to return home with home health in 1-2 days   Procedures:   No admission procedures for hospital encounter.   Antimicrobials:  Anti-infectives (From admission, onward)    None        Medication:   aspirin EC  81 mg Oral Daily   Chlorhexidine Gluconate Cloth  6 each Topical Q0600   darbepoetin (ARANESP) injection - DIALYSIS  150 mcg Intravenous Q Mon-HD   levothyroxine  25 mcg Oral Daily   metoprolol tartrate  12.5 mg Oral BID   midodrine  10 mg Oral TID WC    rosuvastatin  10 mg Oral Daily   sevelamer carbonate  2,400 mg Oral TID WC    nitroGLYCERIN, polyethylene glycol   Objective:   Vitals:   08/16/21 0537 08/16/21 0649 08/16/21 0909 08/16/21 1045  BP: (!) 117/57 (!) 108/58 (!) 124/50 (!) 118/51  Pulse: (!) 109 (!) 116 (!) 117 (!) 114  Resp: 16 18 18 17   Temp: 98.4 F (36.9 C) 98.8 F (37.1 C) 98.6 F (37 C) 98.6 F (37 C)  TempSrc:  Oral Oral Oral  SpO2: 99% 100% 100% 100%  Weight: 56 kg     Height:        Intake/Output Summary (Last 24 hours) at 08/16/2021 1204 Last data filed at 08/16/2021 0920 Gross per 24 hour  Intake 600 ml  Output --  Net 600 ml   Filed Weights   08/15/21 1144 08/15/21 2224 08/16/21 0537  Weight: 58.5 kg 56.1 kg 56 kg     Examination:   Physical Exam  Constitution:  Alert, cooperative, no distress,  Appears calm and comfortable  Psychiatric:   Normal and stable mood and affect, cognition intact,   HEENT:        Normocephalic, PERRL, otherwise with in Normal limits  Chest:         Chest symmetric Cardio vascular:  S1/S2, RRR, No murmure, No Rubs or Gallops  pulmonary: Clear to auscultation bilaterally, respirations unlabored, negative wheezes / crackles Abdomen: Soft, non-tender, non-distended, bowel sounds,no masses, no organomegaly Muscular skeletal: Limited exam - in bed, able to move all 4 extremities,   Neuro: CNII-XII intact. , normal motor and sensation, reflexes intact  Extremities: No pitting edema lower extremities, +2 pulses  Skin: Dry, warm to touch, negative for any Rashes, No open wounds Wounds: per nursing documentation   ------------------------------------------------------------------------------------------------------------------------------------------    LABs:  CBC Latest Ref Rng & Units 08/16/2021 08/15/2021 07/23/2021  WBC 4.0 - 10.5 K/uL 4.6 5.5 10.8(H)  Hemoglobin 12.0 - 15.0 g/dL 8.9(L) 9.7(L) 9.5(L)  Hematocrit 36.0 - 46.0 % 27.8(L) 31.2(L) 28.7(L)  Platelets 150 -  400 K/uL 163 PLATELET CLUMPS NOTED ON SMEAR PLATELET CLUMPS NOTED ON SMEAR, UNABLE TO ESTIMATE   CMP Latest Ref Rng & Units 08/16/2021 08/15/2021 07/23/2021  Glucose 70 - 99 mg/dL 82 173(H) 133(H)  BUN 8 - 23 mg/dL 53(H) 42(H) 52(H)  Creatinine 0.44 - 1.00 mg/dL 6.87(H) 5.25(H) 7.83(H)  Sodium 135 - 145 mmol/L 140 136 130(L)  Potassium 3.5 - 5.1 mmol/L 3.3(L) 3.3(L) 4.5  Chloride 98 - 111 mmol/L 103 98 91(L)  CO2 22 - 32 mmol/L 25 27 24   Calcium 8.9 - 10.3 mg/dL 9.4 9.3 9.6  Total Protein 6.5 - 8.1 g/dL - - -  Total Bilirubin 0.3 - 1.2 mg/dL - - -  Alkaline Phos 38 - 126 U/L - - -  AST 15 - 41 U/L - - -  ALT 0 - 44 U/L - - -       Micro Results Recent Results (from the past 240 hour(s))  Resp Panel by RT-PCR (Flu A&B, Covid) Nasopharyngeal Swab     Status: None   Collection Time: 08/15/21  4:36 PM   Specimen: Nasopharyngeal Swab; Nasopharyngeal(NP) swabs in vial transport medium  Result Value Ref Range Status   SARS Coronavirus 2 by RT PCR NEGATIVE NEGATIVE Final    Comment: (NOTE) SARS-CoV-2 target nucleic acids are NOT DETECTED.  The SARS-CoV-2 RNA is generally detectable in upper respiratory specimens during the acute phase of infection. The lowest concentration of SARS-CoV-2 viral copies this assay can detect is 138 copies/mL. A negative result does not preclude SARS-Cov-2 infection and should not be used as the sole basis for treatment or other patient management decisions. A negative result may occur with  improper specimen collection/handling, submission of specimen other than nasopharyngeal swab, presence of viral mutation(s) within the areas targeted by this assay, and inadequate number of viral copies(<138 copies/mL). A negative result must be combined with clinical observations, patient history, and epidemiological information. The expected result is Negative.  Fact Sheet for Patients:  EntrepreneurPulse.com.au  Fact Sheet for Healthcare  Providers:  IncredibleEmployment.be  This test is no t yet approved or cleared by the [place] FDA and  has been authorized for detection and/or diagnosis of SARS-CoV-2 by FDA under an Emergency Use Authorization (EUA). This EUA will remain  in effect (meaning this test can be used) for the duration of the COVID-19 declaration under Section 564(b)(1) of the Act, 21 U.S.C.section 360bbb-3(b)(1), unless the  authorization is terminated  or revoked sooner.       Influenza A by PCR NEGATIVE NEGATIVE Final   Influenza B by PCR NEGATIVE NEGATIVE Final    Comment: (NOTE) The Xpert Xpress SARS-CoV-2/FLU/RSV plus assay is intended as an aid in the diagnosis of influenza from Nasopharyngeal swab specimens and should not be used as a sole basis for treatment. Nasal washings and aspirates are unacceptable for Xpert Xpress SARS-CoV-2/FLU/RSV testing.  Fact Sheet for Patients: EntrepreneurPulse.com.au  Fact Sheet for Healthcare Providers: IncredibleEmployment.be  This test is not yet approved or cleared by the [place] FDA and has been authorized for detection and/or diagnosis of SARS-CoV-2 by FDA under an Emergency Use Authorization (EUA). This EUA will remain in effect (meaning this test can be used) for the duration of the COVID-19 declaration under Section 564(b)(1) of the Act, 21 U.S.C. section 360bbb-3(b)(1), unless the authorization is terminated or revoked.  Performed at [facility], [address], [place], [place] [address]     Radiology Reports DG Chest [place] 1 View  Result Date: 08/15/2021 CLINICAL DATA:  Chest pain EXAM: PORTABLE CHEST 1 VIEW COMPARISON:  Chest x-rays dated 07/17/2021 and 03/29/2017. FINDINGS: Stable cardiomegaly. Median sternotomy wires appear intact and stable in alignment. Coarse lung markings bilaterally. No confluent opacity to suggest a superimposed pneumonia or pulmonary edema. No pleural  effusion or pneumothorax is seen. No acute-appearing osseous abnormality. IMPRESSION: 1. No active disease. No evidence of pneumonia or pulmonary edema. 2. Stable cardiomegaly. 3. Coarse lung markings bilaterally, suspected chronic interstitial lung disease. Electronically Signed   By: Franki Cabot M.D.   On: 08/15/2021 12:22    SIGNED: Deatra James, MD, FHM. [facility],  Pager (please use [email or web address] to page/text) Please use Epic Secure Chat for non-urgent communication (7AM-7PM)  If 7PM-7AM, please contact night-coverage [email or web address], 08/16/2021, 12:04 PM

## 2021-08-16 NOTE — Progress Notes (Addendum)
ANTICOAGULATION CONSULT NOTE - Initial Consult  Pharmacy Consult for heparin Indication: chest pain/ACS  Allergies  Allergen Reactions   Amlodipine Swelling   Aspirin Other (See Comments)    High Doses Mess up her stomach; "makes my bowels have blood in them". Takes 81 mg EC Aspirin    Nitrofurantoin Hives   Penicillins Other (See Comments)    SYNCOPE? , "makes me real weak when I take it; like I'll pass out"  Has patient had a PCN reaction causing immediate rash, facial/tongue/throat swelling, SOB or lightheadedness with hypotension: Yes Has patient had a PCN reaction causing severe rash involving mucus membranes or skin necrosis: no Has patient had a PCN reaction that required hospitalization no Has patient had a PCN reaction occurring within the last 10 years: no If all of the above    Bactrim [Sulfamethoxazole-Trimethoprim] Rash   Contrast Media [Iodinated Contrast Media] Itching   Iron Itching and Other (See Comments)    "they gave me iron in dialysis; had to give me Benadryl cause I had to have the iron" (05/02/2012)   Tylenol [Acetaminophen] Itching and Other (See Comments)    Makes her feet on fire per pt   Gabapentin Other (See Comments)    Unknown reaction   Iron Sucrose    Ranexa [Ranolazine] Other (See Comments)    Myoclonus-hospitalized    Sucroferric Oxyhydroxide    Dexilant [Dexlansoprazole] Other (See Comments)    Upset stomach   Hydralazine Itching    Has tolerated while inpatient Has tolerated while inpatient   Levaquin [Levofloxacin In D5w] Rash   Morphine And Related Itching and Other (See Comments)    Itching in feet   Pantoprazole Rash   Plavix [Clopidogrel Bisulfate] Rash   Protonix [Pantoprazole Sodium] Rash   Venofer [Ferric Oxide] Itching and Other (See Comments)    Patient reports using Benadryl prior to doses as [place]    Patient Measurements: Height: 5\' 1"  (154.9 cm) Weight: 56 kg (123 lb 7.3 oz) IBW/kg (Calculated) :  47.8  Vital Signs: Temp: 98.8 F (37.1 C) (02/06 0649) Temp Source: Oral (02/06 0649) BP: 108/58 (02/06 0649) Pulse Rate: 116 (02/06 0649)  Labs: Recent Labs    08/15/21 1205 08/15/21 1401 08/16/21 0511  HGB 9.7*  --  8.9*  HCT 31.2*  --  27.8*  PLT PLATELET CLUMPS NOTED ON SMEAR  --  163  CREATININE 5.25*  --  6.87*  TROPONINIHS 27* 27* 46*    Estimated Creatinine Clearance: 4.8 mL/min (A) (by C-G formula based on SCr of 6.87 mg/dL (H)).   Medical History: Past Medical History:  Diagnosis Date   Acute on chronic respiratory failure with hypoxia ([place]) 10/10/2016   Anxiety    Arthritis    AVM (arteriovenous malformation) of colon    CAD (coronary artery disease)    a. s/p CABG in 2013 b. DES to D1 in 10/2016. c. cath in 07/2018 showing patent grafts with occlusion of D1 at prior stent site and progression of PDA disease --> medical management recommended   Carotid artery disease ([place])    a. 41-66% LICA, 0/6301    Chronic anemia    Chronic bronchitis (HCC)    Chronic diastolic CHF (congestive heart failure) ([place])    a. 02/2012 Echo EF 60-65%, nl wall motion, Gr 1 DD, mod MR   Colon cancer ([place]) 1992   Esophageal stricture    ESRD on hemodialysis ([place])    ESRD due to HTN, started dialysis 2011 and  gets HD at [facility] with Dr Hinda Lenis on MWF schedule.  Access is LUA AVF as of Sept 2014.    GERD (gastroesophageal reflux disease)    High cholesterol 12/2011   History of blood transfusion 07/2011; 12/2011; 01/2012 X 2; 04/2012   History of gout    History of lower GI bleeding    Hypertension    Iron deficiency anemia    Jugular vein occlusion, right (HCC)    Mitral regurgitation    a. Moderate by echo, 02/2012   Myocardial infarction [facility])    NSVT (nonsustained ventricular tachycardia)    Ovarian cancer ([place]) 1992   PAF (paroxysmal atrial fibrillation) ([place])    Pneumonia ~ 2009   PUD (peptic ulcer disease)    TIA (transient ischemic attack)     Medications:   Medications Prior to Admission  Medication Sig Dispense Refill Last Dose   ALPRAZolam (XANAX) 0.5 MG tablet Take 0.5 mg by mouth at bedtime.      amiodarone (PACERONE) 200 MG tablet Take 1 tablet (200 mg total) by mouth daily. 90 tablet 2    aspirin EC 81 MG tablet Take 81 mg by mouth daily.       hydrOXYzine (VISTARIL) 100 MG capsule Take 100 mg by mouth daily as needed for anxiety.      isosorbide mononitrate (IMDUR) 30 MG 24 hr tablet Take 3 tablets (90 mg total) by mouth 2 (two) times daily. 60 tablet 3    levothyroxine (SYNTHROID) 25 MCG tablet Take 1 tablet (25 mcg total) by mouth daily. 90 tablet 0    lidocaine-prilocaine (EMLA) cream Apply 1 application topically every Monday, Wednesday, and Friday. Prior to dialysis      loperamide (IMODIUM A-D) 2 MG tablet Take 2 tablets (4 mg total) by mouth daily as needed for diarrhea or loose stools.      metoprolol succinate (TOPROL XL) 25 MG 24 hr tablet Take 1 tablet (25 mg total) by mouth daily. 90 tablet 3    midodrine (PROAMATINE) 5 MG tablet Take Monday, Wednesday, Friday morning BEFORE dialysis 48 tablet 6    multivitamin (RENA-VIT) TABS tablet Take 1 tablet by mouth daily.      nitroGLYCERIN (NITROSTAT) 0.4 MG SL tablet Place 1 tablet (0.4 mg total) under the tongue every 5 (five) minutes x 3 doses as needed for chest pain (if no relief after 2nd dose, proceed to the ED for an evaluation or call 911). 25 tablet 3    omeprazole (PRILOSEC) 20 MG capsule Take 1 capsule (20 mg total) by mouth daily. 90 capsule 3    rosuvastatin (CRESTOR) 10 MG tablet Take 1 tablet (10 mg total) by mouth daily. 90 tablet 3    sevelamer carbonate (RENVELA) 800 MG tablet Take 3 tablets (2,400 mg total) by mouth 3 (three) times daily with meals.      Scheduled:   amiodarone  200 mg Oral Daily   aspirin EC  81 mg Oral Daily   heparin  5,000 Units Subcutaneous Q8H   levothyroxine  25 mcg Oral Daily   midodrine  10 mg Oral BID WC   rosuvastatin  10 mg Oral Daily    sevelamer carbonate  2,400 mg Oral TID WC    Assessment: 81yo female c/o CP similar to CP that led to a recent hospital admission, had recurrent CP this am that responded well to O2 and SL NTG >> to start heparin infusion.  Of note on recent admission heparin was stopped d/t  drop in Hgb (HIT Ab negative) so will aim for a low goal and adjust rates cautiously.  Goal of Therapy:  Heparin level 0.3-0.5 units/ml Monitor platelets by anticoagulation protocol: Yes   Plan:  Rec'd SQ UFH <2h ago; start heparin infusion at 600 units/hr and monitor heparin levels and CBC.  Wynona Neat, PharmD, BCPS  08/16/2021,7:06 AM

## 2021-08-16 NOTE — TOC Progression Note (Signed)
Transition of Care [facility]) - Progression Note    Patient Details  Name: Shawna [name] MRN: [id number] Date of Birth: Dec 19, 1940  Transition of Care [facility]) CM/SW Contact  Salome Arnt, [place] Phone Number: 08/16/2021, 2:42 PM  Clinical Narrative:   Transition of Care [facility]) Screening Note   Patient Details  Name: Shawna [name] Date of Birth: Feb 17, 1941   Transition of Care [facility]) CM/SW Contact:    Salome Arnt, LCSW Phone Number: 08/16/2021, 2:42 PM    Transition of Care Department [facility]) has reviewed patient and no TOC needs have been identified at this time. We will continue to monitor patient advancement through interdisciplinary progression rounds. If new patient transition needs arise, please place a TOC consult.         Barriers to Discharge: Continued Medical Work up  Expected Discharge Plan and Services                                                 Social Determinants of Health (SDOH) Interventions    Readmission Risk Interventions Readmission Risk Prevention Plan 07/23/2021 11/11/2020 10/04/2020  Transportation Screening Complete Complete Complete  PCP or Specialist Appt within 3-5 Days - - -  HRI or [place] for [place] - - -  Medication Review [employer] photographer) Complete Complete Complete  PCP or Specialist appointment within 3-5 days of discharge Complete Complete Complete  HRI or Home Care Consult Complete Complete Complete  SW Recovery Care/Counseling Consult Complete Complete Complete  Palliative Care Screening Not Applicable Not Applicable Not Applicable  Skilled Nursing Facility Not Applicable Not Applicable Complete  Some recent data might be hidden

## 2021-08-16 NOTE — Assessment & Plan Note (Addendum)
-   On hemodialysis Monday Wednesdays and Fridays -Ok to change Midodrine to 5 mg TID on HD days to help with intrahemodialysis hypotension -Nephrology consult appreciated

## 2021-08-16 NOTE — Progress Notes (Signed)
EKG done and given to nurse 

## 2021-08-16 NOTE — Assessment & Plan Note (Addendum)
Continue Crestor 

## 2021-08-16 NOTE — Progress Notes (Signed)
Made initial visit today and met with both Shawna [name] and her husband [name]. They have been married for 10 years and have 1 living daughter and around 60 grandchildren. [name] shared that she was aware of her heart issues and was concerned about reciving Dialysis. We spoke about her illness story and her relationships with her children and spiritual heritage. She ws open and loving as she spoke about her relationships with her family. Chaplain provided space for reflection and prayer today bedside. Will continue to visit in order to povie spiritual support and to assess for spiritual need.  Rev. Bennie Pierini, M.Div [phone]

## 2021-08-16 NOTE — Assessment & Plan Note (Addendum)
-   Chest pain improved with nitro, see chest pain above

## 2021-08-16 NOTE — Assessment & Plan Note (Addendum)
-   Anemia of chronic disease due to CKD, -Hgb stable around 8 -Procrit/ESA agent by nephrology team -Prior H/o transfusions

## 2021-08-16 NOTE — Assessment & Plan Note (Addendum)
-   Addressed with hemodialysis sessions

## 2021-08-16 NOTE — Progress Notes (Signed)
Patient c/o Chest pain rated at 10. Nitro 0.4mg  given SL x2 doses. VSS, Pain rated at 4. Will continue to monitor.

## 2021-08-16 NOTE — Assessment & Plan Note (Addendum)
-   In the setting of tachycardia in a patient with underlying CAD/status post prior CABG -Previously ruled out for ACS by EKG and cardiac enzymes -LHC 07/22/2021 without significant obstructive findings -treated with  IV heparin and nitro -Continue aspirin, bystolic and Crestor -Cardiology consult appreciated -Remains chest pain-free

## 2021-08-16 NOTE — Plan of Care (Signed)

## 2021-08-16 NOTE — Hospital Course (Addendum)
Per HPI:  Shawna [name] is a 81 y.o. female with medical history significant for ESRD, systolic and diastolic CHF, atrial fibrillation, ESRD, CABG, diabetes mellitus, GI bleed. Patient presented to the ED with complaints of onset of chest pain this morning.   She reports chest pain on the right side and moved to the left.  No radiation, or SOB. Not worse with deep breathing or cough.   She goes for dialysis MWF, but the last few sessions they have not been able to pull off enough fluid due to her low blood pressure.  She reports the midodrine helps a bit.  She had to go again yesterday Saturday but they were able to pull just 1 L.  She feels her abdomen is bloated. She does not know her dry weight.  She does not make any urine.   She reports prior similar chest pains about 3 weeks ago, which she was admitted to the hospital 1/5-1/14 NSTEMI, started on heparin drip initially and was discontinued due to drop in hemoglobin, cardiac cath was done which showed-unchanged anatomy compared to her prior cath in 2020, patent grafts.  It was felt that her chest pain and mild enzyme leak (Troponin elevated 449) was secondary to demand ischemia, and medical therapy was recommended. She required 1 unit PRBC of blood.   ED Course:  Tachycardic HR 112-121.  RR 14- 30.  BP down to 88/54, subsequently improved systolic now 211Z. Troponin 27 x 2.  EKG without significant changes from prior.  Potassium 3.3.   Chest x-ray clear. EDP talked to cardiology, recommended d/c Imdur, metoprolol Admitted for Hypotension, Tachycardia and chest pain

## 2021-08-17 ENCOUNTER — Ambulatory Visit: Payer: Medicare HMO | Admitting: Cardiology

## 2021-08-17 DIAGNOSIS — Z515 Encounter for palliative care: Secondary | ICD-10-CM | POA: Diagnosis not present

## 2021-08-17 DIAGNOSIS — E785 Hyperlipidemia, unspecified: Secondary | ICD-10-CM

## 2021-08-17 DIAGNOSIS — Z992 Dependence on renal dialysis: Secondary | ICD-10-CM | POA: Diagnosis not present

## 2021-08-17 DIAGNOSIS — N186 End stage renal disease: Secondary | ICD-10-CM | POA: Diagnosis not present

## 2021-08-17 DIAGNOSIS — Z7189 Other specified counseling: Secondary | ICD-10-CM | POA: Diagnosis not present

## 2021-08-17 DIAGNOSIS — R079 Chest pain, unspecified: Secondary | ICD-10-CM | POA: Diagnosis not present

## 2021-08-17 DIAGNOSIS — Z951 Presence of aortocoronary bypass graft: Secondary | ICD-10-CM | POA: Diagnosis not present

## 2021-08-17 DIAGNOSIS — I4891 Unspecified atrial fibrillation: Secondary | ICD-10-CM | POA: Diagnosis not present

## 2021-08-17 LAB — CBC
HCT: 32.1 % — ABNORMAL LOW (ref 36.0–46.0)
Hemoglobin: 10 g/dL — ABNORMAL LOW (ref 12.0–15.0)
MCH: 36 pg — ABNORMAL HIGH (ref 26.0–34.0)
MCHC: 31.2 g/dL (ref 30.0–36.0)
MCV: 115.5 fL — ABNORMAL HIGH (ref 80.0–100.0)
Platelets: 226 10*3/uL (ref 150–400)
RBC: 2.78 MIL/uL — ABNORMAL LOW (ref 3.87–5.11)
RDW: 20.1 % — ABNORMAL HIGH (ref 11.5–15.5)
WBC: 6.6 10*3/uL (ref 4.0–10.5)
nRBC: 0 % (ref 0.0–0.2)

## 2021-08-17 LAB — HEPARIN LEVEL (UNFRACTIONATED)
Heparin Unfractionated: 0.16 IU/mL — ABNORMAL LOW (ref 0.30–0.70)
Heparin Unfractionated: 0.31 IU/mL (ref 0.30–0.70)

## 2021-08-17 LAB — GLUCOSE, CAPILLARY
Glucose-Capillary: 104 mg/dL — ABNORMAL HIGH (ref 70–99)
Glucose-Capillary: 130 mg/dL — ABNORMAL HIGH (ref 70–99)

## 2021-08-17 MED ORDER — AMIODARONE HCL IN DEXTROSE 360-4.14 MG/200ML-% IV SOLN
60.0000 mg/h | INTRAVENOUS | Status: DC
  Administered 2021-08-17: 60 mg/h via INTRAVENOUS
  Filled 2021-08-17 (×2): qty 200

## 2021-08-17 MED ORDER — AMIODARONE HCL 150 MG/3ML IV SOLN
150.0000 mg | Freq: Once | INTRAVENOUS | Status: DC
  Filled 2021-08-17: qty 3

## 2021-08-17 MED ORDER — LORAZEPAM 1 MG PO TABS
1.0000 mg | ORAL_TABLET | Freq: Once | ORAL | Status: AC
  Administered 2021-08-17: 1 mg via ORAL
  Filled 2021-08-17: qty 1

## 2021-08-17 MED ORDER — HEPARIN BOLUS VIA INFUSION
1000.0000 [IU] | Freq: Once | INTRAVENOUS | Status: AC
  Administered 2021-08-17: 1000 [IU] via INTRAVENOUS
  Filled 2021-08-17: qty 1000

## 2021-08-17 MED ORDER — CHLORHEXIDINE GLUCONATE CLOTH 2 % EX PADS
6.0000 | MEDICATED_PAD | Freq: Every day | CUTANEOUS | Status: DC
  Administered 2021-08-18 – 2021-08-20 (×3): 6 via TOPICAL

## 2021-08-17 MED ORDER — METOPROLOL TARTRATE 25 MG PO TABS
12.5000 mg | ORAL_TABLET | Freq: Two times a day (BID) | ORAL | Status: DC
  Administered 2021-08-17 – 2021-08-18 (×4): 12.5 mg via ORAL
  Filled 2021-08-17 (×4): qty 1

## 2021-08-17 MED ORDER — AMIODARONE HCL IN DEXTROSE 360-4.14 MG/200ML-% IV SOLN
30.0000 mg/h | INTRAVENOUS | Status: DC
  Administered 2021-08-17: 30 mg/h via INTRAVENOUS
  Administered 2021-08-18: 15 mg/h via INTRAVENOUS
  Filled 2021-08-17 (×5): qty 200

## 2021-08-17 MED ORDER — AMIODARONE IV BOLUS ONLY 150 MG/100ML
150.0000 mg | Freq: Once | INTRAVENOUS | Status: AC
  Administered 2021-08-17: 150 mg via INTRAVENOUS
  Filled 2021-08-17 (×2): qty 100

## 2021-08-17 MED ORDER — SODIUM CHLORIDE 0.9 % IV BOLUS
500.0000 mL | Freq: Once | INTRAVENOUS | Status: AC
  Administered 2021-08-17: 500 mL via INTRAVENOUS

## 2021-08-17 NOTE — Progress Notes (Signed)
Subjective:  HD yest-  removed one liter -  BP did not lower during treatment but is low this AM-  maybe after lopressor ? but is needed due to high HR -  cards has seen and will put on amiodarone.  She is talkative and says she feels better today   Objective Vital signs in last 24 hours: Vitals:   08/17/21 0542 08/17/21 0543 08/17/21 0638 08/17/21 0850  BP: (!) 60/51 (!) 82/56 (!) 122/55 103/68  Pulse: (!) 112  (!) 130 (!) 139  Resp:    18  Temp:      TempSrc:      SpO2:    99%  Weight:      Height:       Weight change: -2.5 kg  Intake/Output Summary (Last 24 hours) at 08/17/2021 0905 Last data filed at 08/17/2021 0305 Gross per 24 hour  Intake 713.81 ml  Output 1041 ml  Net -327.19 ml   Dialysis Orders:  [facility] - MWF-  3.5 hours  EDW 54. HD Bath 2k/2.5 ca, Heparin none. Access LUE AVG BFR 300, DFR 500- 15 guage. Micera 200 mcg IV every 2 weeks   Assessment/Plan: 81 year old BF with multiple medical issues and ESRD-  frequent hospitalizations 1 CP-  per cards-  just had a cardiac cath-  plan was for medical management-  on heparin currently  2 ESRD: MWF- [facility]-  have been having trouble with UF-  now on midodrine-  I really dont think she had volume on but she disagrees-  says has a skirt that she cannot button-  has been eating.  Will plan for HD tomorrow on schedule- is at her EDW this AM  3 Hypertension/vol: as above controversy over fluid-  I think EDW is set too low -  can continue midodrine for now-  is at EDW as of this AM so does not need aggressive UF-  cardiology to start amiodarone to assist with HR today  4. Anemia of ESRD: hgb drop ?  Overnight-  on heparin-  will treat with ESA.  Hgb back up this AM-  the 8.9 must have been an error 5. Metabolic Bone Disease: will continue home renvela -  dont know other routine meds   Shawna [name] A Ziya Coonrod    Labs: Basic Metabolic Panel: Recent Labs  Lab 08/15/21 1205 08/16/21 0511  NA 136 140  K 3.3* 3.3*  CL 98  103  CO2 27 25  GLUCOSE 173* 82  BUN 42* 53*  CREATININE 5.25* 6.87*  CALCIUM 9.3 9.4   Liver Function Tests: No results for input(s): AST, ALT, ALKPHOS, BILITOT, PROT, ALBUMIN in the last 168 hours. No results for input(s): LIPASE, AMYLASE in the last 168 hours. No results for input(s): AMMONIA in the last 168 hours. CBC: Recent Labs  Lab 08/15/21 1205 08/16/21 0511 08/17/21 0636  WBC 5.5 4.6 6.6  NEUTROABS 4.0  --   --   HGB 9.7* 8.9* 10.0*  HCT 31.2* 27.8* 32.1*  MCV 117.3* 115.4* 115.5*  PLT PLATELET CLUMPS NOTED ON SMEAR 163 226   Cardiac Enzymes: No results for input(s): CKTOTAL, CKMB, CKMBINDEX, TROPONINI in the last 168 hours. CBG: Recent Labs  Lab 08/17/21 0547 08/17/21 0815  GLUCAP 104* 130*    Iron Studies: No results for input(s): IRON, TIBC, TRANSFERRIN, FERRITIN in the last 72 hours. Studies/Results: DG Chest Port 1 View  Result Date: 08/15/2021 CLINICAL DATA:  Chest pain EXAM: PORTABLE CHEST 1 VIEW COMPARISON:  Chest x-rays dated 07/17/2021 and 03/29/2017. FINDINGS: Stable cardiomegaly. Median sternotomy wires appear intact and stable in alignment. Coarse lung markings bilaterally. No confluent opacity to suggest a superimposed pneumonia or pulmonary edema. No pleural effusion or pneumothorax is seen. No acute-appearing osseous abnormality. IMPRESSION: 1. No active disease. No evidence of pneumonia or pulmonary edema. 2. Stable cardiomegaly. 3. Coarse lung markings bilaterally, suspected chronic interstitial lung disease. Electronically Signed   By: Franki Cabot M.D.   On: 08/15/2021 12:22   Medications: Infusions:  sodium chloride     sodium chloride     amiodarone     Followed by   amiodarone     amiodarone     heparin 750 Units/hr (08/17/21 0857)    Scheduled Medications:  aspirin EC  81 mg Oral Daily   Chlorhexidine Gluconate Cloth  6 each Topical Q0600   darbepoetin (ARANESP) injection - DIALYSIS  150 mcg Intravenous Q Mon-HD   heparin  1,000  Units Intravenous Once   levothyroxine  25 mcg Oral Daily   metoprolol tartrate  12.5 mg Oral BID   midodrine  10 mg Oral TID WC   rosuvastatin  10 mg Oral Daily   sevelamer carbonate  2,400 mg Oral TID WC    have reviewed scheduled and prn medications.  Physical Exam: General:  alert, eating breakfast-  nad Heart: tachy-  irreg Lungs: mostly clear Abdomen: soft, non tender Extremities: no edema Dialysis Access: left AVG - patent     08/17/2021,9:05 AM  LOS: 1 day

## 2021-08-17 NOTE — Progress Notes (Signed)
Bolus given. Vitals rechecked. BP 122/55. HR 130. MD Zierle-ghosh notified. Received order for po ativan 1 mg.

## 2021-08-17 NOTE — Progress Notes (Addendum)
ANTICOAGULATION CONSULT NOTE -Consult  Pharmacy Consult for heparin Indication: chest pain/ACS  Allergies  Allergen Reactions   Amlodipine Swelling   Aspirin Other (See Comments)    High Doses Mess up her stomach; "makes my bowels have blood in them". Takes 81 mg EC Aspirin    Nitrofurantoin Hives   Penicillins Other (See Comments)    SYNCOPE? , "makes me real weak when I take it; like I'll pass out"  Has patient had a PCN reaction causing immediate rash, facial/tongue/throat swelling, SOB or lightheadedness with hypotension: Yes Has patient had a PCN reaction causing severe rash involving mucus membranes or skin necrosis: no Has patient had a PCN reaction that required hospitalization no Has patient had a PCN reaction occurring within the last 10 years: no If all of the above    Bactrim [Sulfamethoxazole-Trimethoprim] Rash   Contrast Media [Iodinated Contrast Media] Itching   Iron Itching and Other (See Comments)    "they gave me iron in dialysis; had to give me Benadryl cause I had to have the iron" (05/02/2012)   Tylenol [Acetaminophen] Itching and Other (See Comments)    Makes her feet on fire per pt   Gabapentin Other (See Comments)    Unknown reaction   Iron Sucrose    Ranexa [Ranolazine] Other (See Comments)    Myoclonus-hospitalized    Sucroferric Oxyhydroxide    Dexilant [Dexlansoprazole] Other (See Comments)    Upset stomach   Hydralazine Itching    Has tolerated while inpatient Has tolerated while inpatient   Levaquin [Levofloxacin In D5w] Rash   Morphine And Related Itching and Other (See Comments)    Itching in feet   Pantoprazole Rash   Plavix [Clopidogrel Bisulfate] Rash   Protonix [Pantoprazole Sodium] Rash   Venofer [Ferric Oxide] Itching and Other (See Comments)    Patient reports using Benadryl prior to doses as [place]    Patient Measurements: Height: 5\' 1"  (154.9 cm) Weight: 54.5 kg (120 lb 1.6 oz) IBW/kg (Calculated) : 47.8  Vital  Signs: Temp: 98.6 F (37 C) (02/06 2120) Temp Source: Oral (02/06 2120) BP: 122/55 (02/07 0638) Pulse Rate: 130 (02/07 0638)  Labs: Recent Labs    08/15/21 1205 08/15/21 1401 08/16/21 0511 08/16/21 0851 08/16/21 1723 08/17/21 0636  HGB 9.7*  --  8.9*  --   --  10.0*  HCT 31.2*  --  27.8*  --   --  32.1*  PLT PLATELET CLUMPS NOTED ON SMEAR  --  163  --   --  226  HEPARINUNFRC  --   --   --   --  0.35 0.16*  CREATININE 5.25*  --  6.87*  --   --   --   TROPONINIHS 27* 27* 46* 49*  --   --      Estimated Creatinine Clearance: 4.8 mL/min (A) (by C-G formula based on SCr of 6.87 mg/dL (H)).   Medical History: Past Medical History:  Diagnosis Date   Acute on chronic respiratory failure with hypoxia ([place]) 10/10/2016   Anxiety    Arthritis    AVM (arteriovenous malformation) of colon    CAD (coronary artery disease)    a. s/p CABG in 2013 b. DES to D1 in 10/2016. c. cath in 07/2018 showing patent grafts with occlusion of D1 at prior stent site and progression of PDA disease --> medical management recommended   Carotid artery disease ([place])    a. 10-25% LICA, 02/5276    Chronic anemia  Chronic bronchitis (HCC)    Chronic diastolic CHF (congestive heart failure) ([place])    a. 02/2012 Echo EF 60-65%, nl wall motion, Gr 1 DD, mod MR   Colon cancer ([place]) 1992   Esophageal stricture    ESRD on hemodialysis ([place])    ESRD due to HTN, started dialysis 2011 and gets HD at [facility] with Dr Hinda Lenis on MWF schedule.  Access is LUA AVF as of Sept 2014.    GERD (gastroesophageal reflux disease)    High cholesterol 12/2011   History of blood transfusion 07/2011; 12/2011; 01/2012 X 2; 04/2012   History of gout    History of lower GI bleeding    Hypertension    Iron deficiency anemia    Jugular vein occlusion, right (HCC)    Mitral regurgitation    a. Moderate by echo, 02/2012   Myocardial infarction [facility])    NSVT (nonsustained ventricular tachycardia)    Ovarian cancer ([place]) 1992   PAF  (paroxysmal atrial fibrillation) ([place])    Pneumonia ~ 2009   PUD (peptic ulcer disease)    TIA (transient ischemic attack)     Medications:  Medications Prior to Admission  Medication Sig Dispense Refill Last Dose   ALPRAZolam (XANAX) 0.5 MG tablet Take 0.5 mg by mouth at bedtime.   08/15/2021   amiodarone (PACERONE) 200 MG tablet Take 1 tablet (200 mg total) by mouth daily. 90 tablet 2 08/15/2021   aspirin EC 81 MG tablet Take 81 mg by mouth daily.    08/15/2021   hydrOXYzine (VISTARIL) 100 MG capsule Take 100 mg by mouth daily as needed for anxiety.   unk   isosorbide mononitrate (IMDUR) 30 MG 24 hr tablet Take 3 tablets (90 mg total) by mouth 2 (two) times daily. 60 tablet 3 08/15/2021   levothyroxine (SYNTHROID) 25 MCG tablet Take 1 tablet (25 mcg total) by mouth daily. 90 tablet 0 08/15/2021   lidocaine-prilocaine (EMLA) cream Apply 1 application topically every Monday, Wednesday, and Friday. Prior to dialysis   Past Week   loperamide (IMODIUM A-D) 2 MG tablet Take 2 tablets (4 mg total) by mouth daily as needed for diarrhea or loose stools.   unk   metoprolol succinate (TOPROL XL) 25 MG 24 hr tablet Take 1 tablet (25 mg total) by mouth daily. 90 tablet 3 08/15/2021 at 0800   midodrine (PROAMATINE) 5 MG tablet Take Monday, Wednesday, Friday morning BEFORE dialysis 48 tablet 6 Past Week   multivitamin (RENA-VIT) TABS tablet Take 1 tablet by mouth daily.   08/15/2021   nitroGLYCERIN (NITROSTAT) 0.4 MG SL tablet Place 1 tablet (0.4 mg total) under the tongue every 5 (five) minutes x 3 doses as needed for chest pain (if no relief after 2nd dose, proceed to the ED for an evaluation or call 911). 25 tablet 3 unk   omeprazole (PRILOSEC) 20 MG capsule Take 1 capsule (20 mg total) by mouth daily. 90 capsule 3 08/15/2021   rosuvastatin (CRESTOR) 10 MG tablet Take 1 tablet (10 mg total) by mouth daily. 90 tablet 3 08/15/2021   sevelamer carbonate (RENVELA) 800 MG tablet Take 3 tablets (2,400 mg total) by mouth 3  (three) times daily with meals.   08/15/2021   Scheduled:   aspirin EC  81 mg Oral Daily   Chlorhexidine Gluconate Cloth  6 each Topical Q0600   darbepoetin (ARANESP) injection - DIALYSIS  150 mcg Intravenous Q Mon-HD   levothyroxine  25 mcg Oral Daily   metoprolol tartrate  12.5 mg Oral BID   midodrine  10 mg Oral TID WC   rosuvastatin  10 mg Oral Daily   sevelamer carbonate  2,400 mg Oral TID WC    Assessment: 81yo female c/o CP similar to CP that led to a recent hospital admission, had recurrent CP this am that responded well to O2 and SL NTG >> to start heparin infusion.  Of note on recent admission heparin was stopped d/t drop in Hgb (HIT Ab negative) so will aim for a low goal and adjust rates cautiously.  HL 0.16- subtherapeutic   Goal of Therapy:  Heparin level 0.3-0.5 units/ml Monitor platelets by anticoagulation protocol: Yes   Plan:  Rebolus heparin 1000 units IV x 1 Increase heparin infusion to  750 units/hr Will recheck HL in 8 hours  Continue to monitor H&H and platelets  Margot Ables, PharmD Clinical Pharmacist 08/17/2021 8:10 AM

## 2021-08-17 NOTE — Consult Note (Signed)
Consultation Note Date: 08/17/2021   Patient Name: Shawna [name]  DOB: [birth year]  MRN: [id number]  Age / Sex: 81 y.o., female  PCP: [facility] [place] Referring Physician: Roxan Hockey, MD  Reason for Consultation: Establishing goals of care  HPI/Patient Profile: 81 y.o. female  with past medical history of ESRD on HD MWF, systolic and diastolic CHF, atrial fibrillation, s/p CABG, MI, GI bleed, colon AVM, diabetes, h/o colon and ovarian cancer, h/o cardiac arrest and has been intubated multiple times previously admitted on 08/15/2021 with chest pain with tachycardia and AF RVR. Has had recurrent chest pain in hospital requiring ICU care for IV amiodarone infusion and heparin infusion. Options for treatment are limited with advanced cardiac and renal disease.   Clinical Assessment and Goals of Care: I met today with Shawna [name] but no family at bedside. She is in good spirits after having a difficult morning of chest pain. She is very happy that her pain has improved. Shawna [name] shares with great love and joy about her family. She lives with her husband of 82 years. She reports that he cooks breakfast and dinner for her - noting that she never knew he could cook until she has been unable to do so herself these past few months! He drives her to her appointments and to church and anywhere else that she wishes to go. She reports that he has been a great husband and help to her. They have one living daughter who lives in [place] but they speak on the phone frequently. She reports that her daughter is a physical therapist but will jump in the car and be by her side when needed.   I discussed with Shawna [name] her health troubles with low blood pressure limiting dialysis pulling off fluid and this can lead to fluid buildup and irritate her heart. We discussed that we have very limited options of how to manage her complicated condition  with her heart and kidney disease both working against her. She has good understanding of her health challenges. She tells me that God has been good to her and she plans to continue to pray. She has a history of being intubated multiple times and cardiac arrest and she feels that God will continue to pull her through. She endorses desire for full code. She does speak about times when she felt "like I was going to glory" (this morning with her chest pain) but focuses on how good God has been to her in pulling her through.   **She does complain of intermittent throbbing pain lateral to her fistula that has been present for ~2 weeks now. She is unable to identify any triggers or anything that makes this pain better or worse. She does say she has an upcoming appointment with her vascular provider and I encouraged her to share at that appointment.**  All questions/concerns addressed. Emotional support provided.   Primary Decision Maker PATIENT    SUMMARY OF RECOMMENDATIONS   - Full code, full scope. Patient working from the context of overcoming multiple  critical illnesses previously.  - Her strength comes from her faith and she believes in the power of prayer and miracles. Her joy comes from her family.  - She endorses good quality of life as she is still able to live at home and go to church although she has slowed down and needs more help.   Code Status/Advance Care Planning: Full code   Symptom Management:  Per attending, renal.   Palliative Prophylaxis:  Frequent Pain Assessment  Additional Recommendations (Limitations, Scope, Preferences): Full Scope Treatment  Psycho-social/Spiritual:  Desire for further Chaplaincy support:yes  Prognosis:  Overall prognosis poor with cardiac disease and dialysis efficacy limited by hypotension.   Discharge Planning: To Be Determined      Primary Diagnoses: Present on Admission:  Chest pain  Anemia in chronic kidney disease  Atrial  fibrillation (HCC)  Carotid artery disease (HCC)  Chronic diastolic CHF (congestive heart failure) (HCC)  Essential hypertension  HLD (hyperlipidemia)  Hypokalemia   I have reviewed the medical record, interviewed the patient and family, and examined the patient. The following aspects are pertinent.  Past Medical History:  Diagnosis Date   Acute on chronic respiratory failure with hypoxia ([place]) 10/10/2016   Anxiety    Arthritis    AVM (arteriovenous malformation) of colon    CAD (coronary artery disease)    a. s/p CABG in 2013 b. DES to D1 in 10/2016. c. cath in 07/2018 showing patent grafts with occlusion of D1 at prior stent site and progression of PDA disease --> medical management recommended   Carotid artery disease ([place])    a. 79-15% LICA, 0/5697    Chronic anemia    Chronic bronchitis (HCC)    Chronic diastolic CHF (congestive heart failure) ([place])    a. 02/2012 Echo EF 60-65%, nl wall motion, Gr 1 DD, mod MR   Colon cancer ([place]) 1992   Esophageal stricture    ESRD on hemodialysis ([place])    ESRD due to HTN, started dialysis 2011 and gets HD at [facility] with Dr Hinda Lenis on MWF schedule.  Access is LUA AVF as of Sept 2014.    GERD (gastroesophageal reflux disease)    High cholesterol 12/2011   History of blood transfusion 07/2011; 12/2011; 01/2012 X 2; 04/2012   History of gout    History of lower GI bleeding    Hypertension    Iron deficiency anemia    Jugular vein occlusion, right (HCC)    Mitral regurgitation    a. Moderate by echo, 02/2012   Myocardial infarction (HCC)    NSVT (nonsustained ventricular tachycardia)    Ovarian cancer ([place]) 1992   PAF (paroxysmal atrial fibrillation) ([place])    Pneumonia ~ 2009   PUD (peptic ulcer disease)    TIA (transient ischemic attack)    Social History   Socioeconomic History   Marital status: Married    Spouse name: Ivry Pigue   Number of children: 1   Years of education: Not on file   Highest education level: GED or  equivalent  Occupational History   Occupation: retired- Technical sales engineer- [employer]  Tobacco Use   Smoking status: Never   Smokeless tobacco: Never  Vaping Use   Vaping Use: Never used  Substance and Sexual Activity   Alcohol use: No    Alcohol/week: 0.0 standard drinks   Drug use: No   Sexual activity: Not Currently    Birth control/protection: Surgical  Other Topics Concern   Not on file  Social History  Narrative   Lives in [place], [place] with husband.  Dialysis pt - mwf.   Social Determinants of Health   Financial Resource Strain: Not on file  Food Insecurity: Not on file  Transportation Needs: Not on file  Physical Activity: Not on file  Stress: Not on file  Social Connections: Not on file   Family History  Problem Relation Age of Onset   Heart disease Mother        Heart Disease before age 73   Hyperlipidemia Mother    Hypertension Mother    Diabetes Mother    Heart attack Mother    Heart disease Father        Heart Disease before age 19   Hyperlipidemia Father    Hypertension Father    Diabetes Father    Diabetes Sister    Hypertension Sister    Diabetes Brother    Hyperlipidemia Brother    Heart attack Brother    Hypertension Sister    Heart attack Brother    Colon cancer Child 5   Other Other        noncontributory for early CAD   Esophageal cancer Neg Hx    Liver disease Neg Hx    Kidney disease Neg Hx    Colon polyps Neg Hx    Scheduled Meds:  aspirin EC  81 mg Oral Daily   Chlorhexidine Gluconate Cloth  6 each Topical Q0600   [START ON 08/18/2021] Chlorhexidine Gluconate Cloth  6 each Topical Q0600   darbepoetin (ARANESP) injection - DIALYSIS  150 mcg Intravenous Q Mon-HD   levothyroxine  25 mcg Oral Daily   metoprolol tartrate  12.5 mg Oral BID   midodrine  10 mg Oral TID WC   rosuvastatin  10 mg Oral Daily   sevelamer carbonate  2,400 mg Oral TID WC   Continuous Infusions:  sodium chloride     sodium chloride     amiodarone 60 mg/hr  (08/17/21 1117)   Followed by   amiodarone     heparin 750 Units/hr (08/17/21 0857)   PRN Meds:.sodium chloride, sodium chloride, lidocaine (PF), lidocaine-prilocaine, nitroGLYCERIN, pentafluoroprop-tetrafluoroeth, polyethylene glycol Allergies  Allergen Reactions   Amlodipine Swelling   Aspirin Other (See Comments)    High Doses Mess up her stomach; "makes my bowels have blood in them". Takes 81 mg EC Aspirin    Nitrofurantoin Hives   Penicillins Other (See Comments)    SYNCOPE? , "makes me real weak when I take it; like I'll pass out"  Has patient had a PCN reaction causing immediate rash, facial/tongue/throat swelling, SOB or lightheadedness with hypotension: Yes Has patient had a PCN reaction causing severe rash involving mucus membranes or skin necrosis: no Has patient had a PCN reaction that required hospitalization no Has patient had a PCN reaction occurring within the last 10 years: no If all of the above    Bactrim [Sulfamethoxazole-Trimethoprim] Rash   Contrast Media [Iodinated Contrast Media] Itching   Iron Itching and Other (See Comments)    "they gave me iron in dialysis; had to give me Benadryl cause I had to have the iron" (05/02/2012)   Tylenol [Acetaminophen] Itching and Other (See Comments)    Makes her feet on fire per pt   Gabapentin Other (See Comments)    Unknown reaction   Iron Sucrose    Ranexa [Ranolazine] Other (See Comments)    Myoclonus-hospitalized    Sucroferric Oxyhydroxide    Dexilant [Dexlansoprazole] Other (See Comments)    Upset  stomach   Hydralazine Itching    Has tolerated while inpatient Has tolerated while inpatient   Levaquin [Levofloxacin In D5w] Rash   Morphine And Related Itching and Other (See Comments)    Itching in feet   Pantoprazole Rash   Plavix [Clopidogrel Bisulfate] Rash   Protonix [Pantoprazole Sodium] Rash   Venofer [Ferric Oxide] Itching and Other (See Comments)    Patient reports using Benadryl prior to doses as  [place]  Constitutional:  Positive for activity change and fatigue. Negative for appetite change.  Cardiovascular:  Positive for chest pain.       Chest pain present this morning but resolved at the time of my visit   Physical Exam Vitals and nursing note reviewed.  Constitutional:      General: She is not in acute distress. Cardiovascular:     Rate and Rhythm: Tachycardia present. Rhythm irregularly irregular.  Pulmonary:     Effort: No tachypnea, accessory muscle usage or respiratory distress.  Abdominal:     Palpations: Abdomen is soft.  Skin:      Neurological:     Mental Status: She is alert.    Vital Signs: BP (!) 125/43    Pulse (!) 114    Temp 98.5 F (36.9 C) (Oral)    Resp (!) 28    Ht 5' 2"  (1.575 m)    Wt 54.5 kg    SpO2 100%    BMI 21.98 kg/m  Pain Scale: 0-10 POSS *See Group Information*: 1-Acceptable,Awake and alert Pain Score: 9    SpO2: SpO2: 100 % O2 Device:SpO2: 100 % O2 Flow Rate: .O2 Flow Rate (L/min): 2 L/min  IO: Intake/output summary:  Intake/Output Summary (Last 24 hours) at 08/17/2021 1442 Last data filed at 08/17/2021 0305 Gross per 24 hour  Intake 113.81 ml  Output 1041 ml  Net -927.19 ml    LBM: Last BM Date: 08/14/21 Baseline Weight: Weight: 58.5 kg Most recent weight: Weight: 54.5 kg     Palliative Assessment/Data:     Time Total: 75 min  Greater than 50%  of this time was spent counseling and coordinating care related to the above assessment and plan.  Signed by: Vinie Sill, NP Palliative Medicine Team Pager # [phone] (M-F 8a-5p) Team Phone # [phone] (Nights/Weekends)

## 2021-08-17 NOTE — Assessment & Plan Note (Signed)
-   Palliative care consult appreciated -Request full code and full scope of treatment without limitations -Patient understands that she has many comorbid conditions and overall high risk for decompensation

## 2021-08-17 NOTE — Progress Notes (Addendum)
Progress Note  Patient Name: Shawna [name] Date of Encounter: 08/17/2021  [facility] Cardiologist: Carlyle Dolly, MD    Subjective   Sleeping comfortably, no more chest pain, HR 130/m  Inpatient Medications    Scheduled Meds:  aspirin EC  81 mg Oral Daily   Chlorhexidine Gluconate Cloth  6 each Topical Q0600   darbepoetin (ARANESP) injection - DIALYSIS  150 mcg Intravenous Q Mon-HD   levothyroxine  25 mcg Oral Daily   metoprolol tartrate  12.5 mg Oral BID   midodrine  10 mg Oral TID WC   rosuvastatin  10 mg Oral Daily   sevelamer carbonate  2,400 mg Oral TID WC   Continuous Infusions:  sodium chloride     sodium chloride     amiodarone     Followed by   amiodarone     heparin 600 Units/hr (08/16/21 0807)   PRN Meds: sodium chloride, sodium chloride, lidocaine (PF), lidocaine-prilocaine, nitroGLYCERIN, pentafluoroprop-tetrafluoroeth, polyethylene glycol   Vital Signs    Vitals:   08/17/21 0541 08/17/21 0542 08/17/21 0543 08/17/21 0638  BP: (!) 71/39 (!) 60/51 (!) 82/56 (!) 122/55  Pulse: (!) 134 (!) 112  (!) 130  Resp:      Temp:      TempSrc:      SpO2:      Weight:      Height:        Intake/Output Summary (Last 24 hours) at 08/17/2021 0801 Last data filed at 08/17/2021 0305 Gross per 24 hour  Intake 713.81 ml  Output 1041 ml  Net -327.19 ml   Last 3 Weights 08/17/2021 08/16/2021 08/16/2021  Weight (lbs) 120 lb 1.6 oz 123 lb 7.3 oz 123 lb 7.3 oz  Weight (kg) 54.477 kg 56 kg 56 kg      Telemetry    Aflutter 130/m - Personally Reviewed  ECG       Physical Exam    GEN: No acute distress.   Neck: No JVD Cardiac: RRR 130/m Respiratory: Clear to auscultation bilaterally. GI: Soft, nontender, non-distended  MS: No edema; No deformity. Neuro:  Nonfocal  Psych: Normal affect   Labs    High Sensitivity Troponin:   Recent Labs  Lab 08/15/21 1205 08/15/21 1401 08/16/21 0511 08/16/21 0851  TROPONINIHS 27* 27* 46* 49*     Chemistry Recent  Labs  Lab 08/15/21 1205 08/15/21 1401 08/16/21 0511  NA 136  --  140  K 3.3*  --  3.3*  CL 98  --  103  CO2 27  --  25  GLUCOSE 173*  --  82  BUN 42*  --  53*  CREATININE 5.25*  --  6.87*  CALCIUM 9.3  --  9.4  MG  --  1.9  --   GFRNONAA 8*  --  6*  ANIONGAP 11  --  12    Lipids No results for input(s): CHOL, TRIG, HDL, LABVLDL, LDLCALC, CHOLHDL in the last 168 hours.  Hematology Recent Labs  Lab 08/15/21 1205 08/16/21 0511 08/17/21 0636  WBC 5.5 4.6 6.6  RBC 2.66* 2.41* 2.78*  HGB 9.7* 8.9* 10.0*  HCT 31.2* 27.8* 32.1*  MCV 117.3* 115.4* 115.5*  MCH 36.5* 36.9* 36.0*  MCHC 31.1 32.0 31.2  RDW 19.8* 19.9* 20.1*  PLT PLATELET CLUMPS NOTED ON SMEAR 163 226   Thyroid No results for input(s): TSH, FREET4 in the last 168 hours.  BNP Recent Labs  Lab 08/15/21 2040  BNP 1,051.0*    DDimer  No results for input(s): DDIMER in the last 168 hours.   Radiology    DG Chest Port 1 View  Result Date: 08/15/2021 CLINICAL DATA:  Chest pain EXAM: PORTABLE CHEST 1 VIEW COMPARISON:  Chest x-rays dated 07/17/2021 and 03/29/2017. FINDINGS: Stable cardiomegaly. Median sternotomy wires appear intact and stable in alignment. Coarse lung markings bilaterally. No confluent opacity to suggest a superimposed pneumonia or pulmonary edema. No pleural effusion or pneumothorax is seen. No acute-appearing osseous abnormality. IMPRESSION: 1. No active disease. No evidence of pneumonia or pulmonary edema. 2. Stable cardiomegaly. 3. Coarse lung markings bilaterally, suspected chronic interstitial lung disease. Electronically Signed   By: Franki Cabot M.D.   On: 08/15/2021 12:22    Cardiac Studies      Echocardiogram: 07/2021 IMPRESSIONS     1. Left ventricular ejection fraction, by estimation, is 30 to 35%. The  left ventricle has moderately decreased function. The left ventricle  demonstrates regional wall motion abnormalities (see scoring  diagram/findings for description). There is mild   left ventricular hypertrophy. Left ventricular diastolic parameters are  consistent with Grade II diastolic dysfunction (pseudonormalization).  Elevated left atrial pressure.   2. Right ventricular systolic function is moderately reduced. The right  ventricular size is normal. There is mildly elevated pulmonary artery  systolic pressure. The estimated right ventricular systolic pressure is  16.1 mmHg.   3. Left atrial size was severely dilated.   4. Right atrial size was mildly dilated.   5. The mitral valve is degenerative. Moderate mitral valve regurgitation.  Mild to moderate mitral stenosis. MG 52mmHg at 70bpm, MVA 1.2 cm^2 by  continuity equation. Moderate mitral annular calcification.   6. Tricuspid valve regurgitation is mild to moderate.   7. The aortic valve is tricuspid. Aortic valve regurgitation is not  visualized. Aortic valve sclerosis/calcification is present, without any  evidence of aortic stenosis.   8. The inferior vena cava is normal in size with greater than 50%  respiratory variability, suggesting right atrial pressure of 3 mmHg.      LHC: 07/2021 Mid LM to Dist LM lesion is 60% stenosed.   Prox LAD lesion is 90% stenosed.   Ost Cx to Mid Cx lesion is 99% stenosed.   Ost RCA to Prox RCA lesion is 100% stenosed.   Ost Ramus to Ramus lesion is 80% stenosed.   Ost 1st Diag to 1st Diag lesion is 100% stenosed.   Ost RPDA to RPDA lesion is 75% stenosed.   2nd Mrg lesion is 60% stenosed.   Dist LAD lesion is 70% stenosed.   LIMA and is large.   SVG and is large.   SVG and is normal in caliber.   SVG and is large.   The graft exhibits no disease.   The graft exhibits no disease.   The graft exhibits no disease.   LV end diastolic pressure is mildly elevated.   The left ventricular ejection fraction is 35-45% by visual estimate.    IMPRESSION: Ms. Sanmiguel anatomy is for all intents and purposes unchanged from her prior cath performed by Dr. Martinique in 2020.  All  her grafts are patent.  Her LVEDP was 18.  Her EF looks to be in the 40 to 45% range with an anteroapical wall motion abnormality.  I suspect that her chest pain and mild enzyme leak was related to "demand ischemia".  Medical therapy will be recommended.  If femoral angiogram was performed and the stick appeared to be slightly high and therefore  Mynx closure was not performed.  The patient will need a manual hold in the recovery bay prior to going to the floor.  Dr. Domenic Polite, the patient's attending cardiologist, was notified of these results.  Patient Profile     81 y.o. female with history of CAD status post CABG (distal small vessel disease), systolic heart failure, ESRD on hemodialysis, recurrent GI bleed, chronic anemia who was admitted on 08/15/2021 with recurrent atrial flutter and chest pain symptoms.  Course is complicated by hypotension not able to tolerate hemodialysis.    Assessment & Plan     Aflutter with RVR-was supposed to start IV amio yest but never was, oral was stopped. Have reordered and will transfer to step down  Chest pain in the setting of Aflutter with RVR, troponins flat, cath last month medical therapy.  s/p CABG in 2013, DES to D1 in 10/2016 and cath in 07/2018 showing patent grafts with occlusion of D1 at prior stent site and progression of PDA disease with medical management recommended. Repeat catheterization 07/2021 showed patent grafts with known PDA disease and overall similar to her prior cath in 07/2018. Currently pain free.  Hypotension-not tolerating dialysis, midodrine increased yest tid when lopressor restarted. Won't tolerate metoprolol at this time.   Anemia - She did require a transfusion during her recent admission ut says her Hgb 8.9.     HLD - She was transitioned from Simvastatin to Crestor during her admission and I have asked her to make sure she is taking this. LDL was at 48 on 07/16/2021.     ESRD - On HD - MWF schedule.     For questions or  updates, please contact [place] Please consult [email or web address] for contact info under      Signed, Ermalinda Barrios, PA-C  08/17/2021, 8:01 AM    Personally seen and examined. Agree with APP above with the following comments: Briefly 81 yo F   with a history of recent GI bleed, CAD s/p CABG with recent LHC-G with patent who presents with return of her AFL, HFrEF, and ESRD Patient notes that she has some chest pain overnight.  Received nitro and it improved.  Has not been able to tolerate much medication with profound hypotension, she is presently asymptomatic Exam notable for BP 108/73 on exam (pre-midodrine), regular tachycardia, thin frail woman  Labs notable for stable hemoglobin on heparin drip Personally reviewed relevant tests; appears to have converted to [place] PM 08/16/21 with return to AFL 08/17/21 Would recommend  - sent to [place] for IV amiodarone load - continue IV heparin, if stable and if OK with GI, potential 08/18/21 transition back to [facility] - long term therapeutic options are very limited, due to her multiple comorbidity (see 08/16/21 note for full details), but patient notes that her daughter  has the ability to pray much of this away; we have not re-engaged [place] conversations at this time - in the setting of ESRD, I have not started ranolazine  Rudean Haskell, MD [place]  [address] [place],  [id number] [phone]  9:50 AM

## 2021-08-17 NOTE — Progress Notes (Signed)
PROGRESS NOTE     Shawna [name], is a 81 y.o. female, DOB - 04-05-1941, CHE:[id number]  Admit date - 08/15/2021   Admitting Physician Shawna Sorn Denton Brick, MD  Outpatient Primary MD for the patient is Practice, [facility]  LOS - 1  Chief Complaint  Patient presents with   Chest Pain        Brief Narrative:   Per HPI:  Shawna [name] is a 81 y.o. female with medical history significant for ESRD, systolic and diastolic CHF, atrial fibrillation, ESRD, CABG, diabetes mellitus, GI bleed. Patient presented to the ED with complaints of onset of chest pain this morning.   She reports chest pain on the right side and moved to the left.  No radiation, or SOB. Not worse with deep breathing or cough.   She goes for dialysis MWF, but the last few sessions they have not been able to pull off enough fluid due to her low blood pressure.  She reports the midodrine helps a bit.  She had to go again yesterday Saturday but they were able to pull just 1 L.  She feels her abdomen is bloated. She does not know her dry weight.  She does not make any urine.   She reports prior similar chest pains about 3 weeks ago, which she was admitted to the hospital 1/5-1/14 NSTEMI, started on heparin drip initially and was discontinued due to drop in hemoglobin, cardiac cath was done which showed-unchanged anatomy compared to her prior cath in 2020, patent grafts.  It was felt that her chest pain and mild enzyme leak (Troponin elevated 449) was secondary to demand ischemia, and medical therapy was recommended. She required 1 unit PRBC of blood.   ED Course:  Tachycardic HR 112-121.  RR 14- 30.  BP down to 88/54, subsequently improved systolic now 536R. Troponin 27 x 2.  EKG without significant changes from prior.  Potassium 3.3.   Chest x-ray clear. EDP talked to cardiology, recommended d/c Imdur, metoprolol Admitted for Hypotension, Tachycardia and chest pain   Assessment & Plan:   -Assessment and Plan: * Chest  pain- (present on admission) - In the setting of tachycardia in a patient with underlying CAD/status post prior CABG -LHC 07/30/2018 without significant obstructive findings -IV heparin, nitro -Aspirin and Crestor -Cardiology consult appreciated  Atrial fibrillation ([place])- (present on admission) - Remains tachycardic, transferred to ICU for initiation of IV amiodarone per cardiology service -Continue IV heparin, possible transition back to [place] on 10/12/3152  Chronic diastolic CHF (congestive heart failure) ([place])- (present on admission) - Continue to use hemodialysis sessions to address volume status  Hypokalemia- (present on admission) - Addressed with hemodialysis sessions  ESRD (end stage renal disease) on dialysis [facility]) - On hemodialysis Monday Wednesdays and Fridays -Midodrine to help with BP support especially with hemodialysis sessions -Nephrology consult appreciated  Goals of care, counseling/discussion - Palliative care consult appreciated -Request full code and full scope of treatment without limitations -Patient understands that she has many comorbid conditions and overall high risk for decompensation  Essential hypertension- (present on admission) - Off BP meds due to soft BP, -Continue midodrine, -Cardiology consult appreciated  Carotid artery disease ([place])- (present on admission) - Currently stable,-Continue current meds  Hx of CABG - Chest pain improved with nitro, see above  Anemia in chronic kidney disease- (present on admission) - Anemia of chronic disease due to CKD, -Hgb improved, -Procrit/ESA agent by nephrology team -  HLD (hyperlipidemia)- (present on admission) - Continue statins  Disposition/Need for in-Hospital Stay- patient unable to be discharged at this time due to -recurrent chest pain, A-fib with RVR, requiring IV amiodarone and nitro and IV heparin*  Status is: Inpatient   Disposition: The patient is from: Home               Anticipated d/c is to: Home              Anticipated d/c date is: 2 days              Patient currently is not medically stable to d/c. Barriers: Not Clinically Stable-   Code Status :  -  Code Status: Full Code   Family Communication:   NA (patient is alert, awake and coherent)   Consults  :  Palliative/cardiology/nephrology  DVT Prophylaxis  :   - SCDs/ IV heparin   Lab Results  Component Value Date   PLT 226 08/17/2021    Inpatient Medications  Scheduled Meds:  aspirin EC  81 mg Oral Daily   Chlorhexidine Gluconate Cloth  6 each Topical Q0600   [START ON 08/18/2021] Chlorhexidine Gluconate Cloth  6 each Topical Q0600   darbepoetin (ARANESP) injection - DIALYSIS  150 mcg Intravenous Q Mon-HD   levothyroxine  25 mcg Oral Daily   metoprolol tartrate  12.5 mg Oral BID   midodrine  10 mg Oral TID WC   rosuvastatin  10 mg Oral Daily   sevelamer carbonate  2,400 mg Oral TID WC   Continuous Infusions:  sodium chloride     sodium chloride     amiodarone 30 mg/hr (08/17/21 1644)   heparin 750 Units/hr (08/17/21 0857)   PRN Meds:.sodium chloride, sodium chloride, lidocaine (PF), lidocaine-prilocaine, nitroGLYCERIN, pentafluoroprop-tetrafluoroeth, polyethylene glycol   Anti-infectives (From admission, onward)    None         Subjective: Shawna [name] today has no fevers, no emesis,  -Chest pain resolved with nitro -Tachycardia persist, currently on IV amiodarone -No bleeding concerns on IV heparin   Objective: Vitals:   08/17/21 1430 08/17/21 1500 08/17/21 1608 08/17/21 1630  BP: (!) 141/30 (!) 126/98  (!) 119/52  Pulse: (!) 58 (!) 56  (!) 54  Resp: 20 (!) 24  (!) 25  Temp:   97.8 F (36.6 C)   TempSrc:   Oral   SpO2: 100% 100%  100%  Weight:      Height:        Intake/Output Summary (Last 24 hours) at 08/17/2021 1802 Last data filed at 08/17/2021 0305 Gross per 24 hour  Intake 113.81 ml  Output 1041 ml  Net -927.19 ml   Filed Weights   08/16/21 1550  08/17/21 0500 08/17/21 0950  Weight: 56 kg 54.5 kg 54.5 kg     Physical Exam  Gen:- Awake Alert,  in no apparent distress  HEENT:- [place].AT, No sclera icterus Neck-Supple Neck,No JVD,.  Lungs-  CTAB , fair symmetrical air movement CV- S1, S2 normal, irregular and tachycardic Abd-  +ve B.Sounds, Abd Soft, No tenderness,    Extremity/Skin:- No  edema, pedal pulses present  Psych-affect is appropriate, oriented x3 Neuro-no new focal deficits, no tremors MSK-AV fistula with positive thrill and bruit  Data Reviewed: I have personally reviewed following labs and imaging studies  CBC: Recent Labs  Lab 08/15/21 1205 08/16/21 0511 08/17/21 0636  WBC 5.5 4.6 6.6  NEUTROABS 4.0  --   --   HGB 9.7* 8.9* 10.0*  HCT 31.2* 27.8* 32.1*  MCV 117.3* 115.4* 115.5*  PLT PLATELET CLUMPS NOTED ON SMEAR 163 161   Basic Metabolic Panel: Recent Labs  Lab 08/15/21 1205 08/15/21 1401 08/16/21 0511  NA 136  --  140  K 3.3*  --  3.3*  CL 98  --  103  CO2 27  --  25  GLUCOSE 173*  --  82  BUN 42*  --  53*  CREATININE 5.25*  --  6.87*  CALCIUM 9.3  --  9.4  MG  --  1.9  --    GFR: Estimated Creatinine Clearance: 5.1 mL/min (A) (by C-G formula based on SCr of 6.87 mg/dL (H)). Liver Function Tests: No results for input(s): AST, ALT, ALKPHOS, BILITOT, PROT, ALBUMIN in the last 168 hours. No results for input(s): LIPASE, AMYLASE in the last 168 hours. No results for input(s): AMMONIA in the last 168 hours. Coagulation Profile: No results for input(s): INR, PROTIME in the last 168 hours. Cardiac Enzymes: No results for input(s): CKTOTAL, CKMB, CKMBINDEX, TROPONINI in the last 168 hours. BNP (last 3 results) No results for input(s): PROBNP in the last 8760 hours. HbA1C: No results for input(s): HGBA1C in the last 72 hours. CBG: Recent Labs  Lab 08/17/21 0547 08/17/21 0815  GLUCAP 104* 130*   Lipid Profile: No results for input(s): CHOL, HDL, LDLCALC, TRIG, CHOLHDL, LDLDIRECT in the  last 72 hours. Thyroid Function Tests: No results for input(s): TSH, T4TOTAL, FREET4, T3FREE, THYROIDAB in the last 72 hours. Anemia Panel: No results for input(s): VITAMINB12, FOLATE, FERRITIN, TIBC, IRON, RETICCTPCT in the last 72 hours. Urine analysis:    Component Value Date/Time   COLORURINE YELLOW 12/16/2012 1919   APPEARANCEUR CLOUDY (A) 12/16/2012 1919   LABSPEC 1.009 12/16/2012 1919   PHURINE 7.5 12/16/2012 1919   GLUCOSEU NEGATIVE 12/16/2012 1919   HGBUR TRACE (A) 12/16/2012 1919   BILIRUBINUR NEGATIVE 12/16/2012 1919   KETONESUR NEGATIVE 12/16/2012 1919   PROTEINUR 100 (A) 12/16/2012 1919   UROBILINOGEN 0.2 12/16/2012 1919   NITRITE NEGATIVE 12/16/2012 1919   LEUKOCYTESUR SMALL (A) 12/16/2012 1919   Sepsis Labs: @LABRCNTIP (procalcitonin:4,lacticidven:4)  ) Recent Results (from the past 240 hour(s))  Resp Panel by RT-PCR (Flu A&B, Covid) Nasopharyngeal Swab     Status: None   Collection Time: 08/15/21  4:36 PM   Specimen: Nasopharyngeal Swab; Nasopharyngeal(NP) swabs in vial transport medium  Result Value Ref Range Status   SARS Coronavirus 2 by RT PCR NEGATIVE NEGATIVE Final    Comment: (NOTE) SARS-CoV-2 target nucleic acids are NOT DETECTED.  The SARS-CoV-2 RNA is generally detectable in upper respiratory specimens during the acute phase of infection. The lowest concentration of SARS-CoV-2 viral copies this assay can detect is 138 copies/mL. A negative result does not preclude SARS-Cov-2 infection and should not be used as the sole basis for treatment or other patient management decisions. A negative result may occur with  improper specimen collection/handling, submission of specimen other than nasopharyngeal swab, presence of viral mutation(s) within the areas targeted by this assay, and inadequate number of viral copies(<138 copies/mL). A negative result must be combined with clinical observations, patient history, and epidemiological information. The  expected result is Negative.  Fact Sheet for Patients:  EntrepreneurPulse.com.au  Fact Sheet for Healthcare Providers:  IncredibleEmployment.be  This test is no t yet approved or cleared by the [place] FDA and  has been authorized for detection and/or diagnosis of SARS-CoV-2 by FDA under an Emergency Use Authorization (EUA). This EUA will remain  in effect (meaning this test can be used)  for the duration of the COVID-19 declaration under Section 564(b)(1) of the Act, 21 U.S.C.section 360bbb-3(b)(1), unless the authorization is terminated  or revoked sooner.       Influenza A by PCR NEGATIVE NEGATIVE Final   Influenza B by PCR NEGATIVE NEGATIVE Final    Comment: (NOTE) The Xpert Xpress SARS-CoV-2/FLU/RSV plus assay is intended as an aid in the diagnosis of influenza from Nasopharyngeal swab specimens and should not be used as a sole basis for treatment. Nasal washings and aspirates are unacceptable for Xpert Xpress SARS-CoV-2/FLU/RSV testing.  Fact Sheet for Patients: EntrepreneurPulse.com.au  Fact Sheet for Healthcare Providers: IncredibleEmployment.be  This test is not yet approved or cleared by the [place] FDA and has been authorized for detection and/or diagnosis of SARS-CoV-2 by FDA under an Emergency Use Authorization (EUA). This EUA will remain in effect (meaning this test can be used) for the duration of the COVID-19 declaration under Section 564(b)(1) of the Act, 21 U.S.C. section 360bbb-3(b)(1), unless the authorization is terminated or revoked.  Performed at [facility], [address] [place], [place] [address]       Radiology Studies: No results found.   Scheduled Meds:  aspirin EC  81 mg Oral Daily   Chlorhexidine Gluconate Cloth  6 each Topical Q0600   [START ON 08/18/2021] Chlorhexidine Gluconate Cloth  6 each Topical Q0600   darbepoetin (ARANESP) injection - DIALYSIS   150 mcg Intravenous Q Mon-HD   levothyroxine  25 mcg Oral Daily   metoprolol tartrate  12.5 mg Oral BID   midodrine  10 mg Oral TID WC   rosuvastatin  10 mg Oral Daily   sevelamer carbonate  2,400 mg Oral TID WC   Continuous Infusions:  sodium chloride     sodium chloride     amiodarone 30 mg/hr (08/17/21 1644)   heparin 750 Units/hr (08/17/21 0857)     LOS: 1 day    Roxan Hockey M.D on 08/17/2021 at 6:02 PM  Go to [email or web address] - for contact info  [facility] - Office  [phone]  If 7PM-7AM, please contact night-coverage [email or web address] Password [facility] 08/17/2021, 6:02 PM

## 2021-08-17 NOTE — Progress Notes (Signed)
Patient got out of the bed and got onto the scale so I could get her daily weight. When getting the patient back into the bed she suddenly began to have chest pain. She rated the pain 10/10, described it as tightness. Nitro 0.4mg  given SL x2 doses. Pain now rated a 0. Patient bp tanked. 71/39 with HR 134. Bp rechecked 60/51, MAP 51, HR 112. Bp then taken manual 82/56. MD Zierle-ghosh notified. 500 Bolus ordered.

## 2021-08-17 NOTE — Progress Notes (Signed)
Patient wanted me to call her daughter, Lilli Few. I explained to her what occurred this morning regarding her moms chest pain and vital signs.

## 2021-08-17 NOTE — Progress Notes (Signed)
[place] for heparin Indication: chest pain/ACS  Allergies  Allergen Reactions   Amlodipine Swelling   Aspirin Other (See Comments)    High Doses Mess up her stomach; "makes my bowels have blood in them". Takes 81 mg EC Aspirin    Nitrofurantoin Hives   Penicillins Other (See Comments)    SYNCOPE? , "makes me real weak when I take it; like I'll pass out"  Has patient had a PCN reaction causing immediate rash, facial/tongue/throat swelling, SOB or lightheadedness with hypotension: Yes Has patient had a PCN reaction causing severe rash involving mucus membranes or skin necrosis: no Has patient had a PCN reaction that required hospitalization no Has patient had a PCN reaction occurring within the last 10 years: no If all of the above    Bactrim [Sulfamethoxazole-Trimethoprim] Rash   Contrast Media [Iodinated Contrast Media] Itching   Iron Itching and Other (See Comments)    "they gave me iron in dialysis; had to give me Benadryl cause I had to have the iron" (05/02/2012)   Tylenol [Acetaminophen] Itching and Other (See Comments)    Makes her feet on fire per pt   Gabapentin Other (See Comments)    Unknown reaction   Iron Sucrose    Ranexa [Ranolazine] Other (See Comments)    Myoclonus-hospitalized    Sucroferric Oxyhydroxide    Dexilant [Dexlansoprazole] Other (See Comments)    Upset stomach   Hydralazine Itching    Has tolerated while inpatient Has tolerated while inpatient   Levaquin [Levofloxacin In D5w] Rash   Morphine And Related Itching and Other (See Comments)    Itching in feet   Pantoprazole Rash   Plavix [Clopidogrel Bisulfate] Rash   Protonix [Pantoprazole Sodium] Rash   Venofer [Ferric Oxide] Itching and Other (See Comments)    Patient reports using Benadryl prior to doses as [place]    Patient Measurements: Height: 5\' 2"  (157.5 cm) Weight: 54.5 kg (120 lb 2.4 oz) IBW/kg (Calculated) : 50.1  Vital  Signs: Temp: 97.8 F (36.6 C) (02/07 1608) Temp Source: Oral (02/07 1608) BP: 119/52 (02/07 1630) Pulse Rate: 54 (02/07 1630)  Labs: Recent Labs    08/15/21 1205 08/15/21 1401 08/16/21 0511 08/16/21 0851 08/16/21 1723 08/17/21 0636 08/17/21 1654  HGB 9.7*  --  8.9*  --   --  10.0*  --   HCT 31.2*  --  27.8*  --   --  32.1*  --   PLT PLATELET CLUMPS NOTED ON SMEAR  --  163  --   --  226  --   HEPARINUNFRC  --   --   --   --  0.35 0.16* 0.31  CREATININE 5.25*  --  6.87*  --   --   --   --   TROPONINIHS 27* 27* 46* 49*  --   --   --      Estimated Creatinine Clearance: 5.1 mL/min (A) (by C-G formula based on SCr of 6.87 mg/dL (H)).   Assessment: 81yo female c/o CP similar to CP that led to a recent hospital admission, had recurrent CP this am that responded well to O2 and SL NTG >> to start heparin infusion.  Of note on recent admission heparin was stopped d/t drop in Hgb (HIT Ab negative) so will aim for a low goal and adjust rates cautiously.  Heparin level 0.31 (therapeutic) on infusion at 750 units/hr. No bleeding noted.  Goal of Therapy:  Heparin level 0.3-0.5  units/ml Monitor platelets by anticoagulation protocol: Yes   Plan: Continue heparin infusion at 750 units/hr F/u a.m. level to confirm therapeutic  Sherlon Handing, PharmD, BCPS Please see [email or web address] for complete clinical pharmacist phone list 08/17/2021 7:05 PM

## 2021-08-18 DIAGNOSIS — I480 Paroxysmal atrial fibrillation: Principal | ICD-10-CM

## 2021-08-18 DIAGNOSIS — I10 Essential (primary) hypertension: Secondary | ICD-10-CM

## 2021-08-18 DIAGNOSIS — N186 End stage renal disease: Secondary | ICD-10-CM | POA: Diagnosis not present

## 2021-08-18 DIAGNOSIS — R079 Chest pain, unspecified: Secondary | ICD-10-CM | POA: Diagnosis not present

## 2021-08-18 LAB — RENAL FUNCTION PANEL
Albumin: 3.3 g/dL — ABNORMAL LOW (ref 3.5–5.0)
Anion gap: 12 (ref 5–15)
BUN: 52 mg/dL — ABNORMAL HIGH (ref 8–23)
CO2: 26 mmol/L (ref 22–32)
Calcium: 9 mg/dL (ref 8.9–10.3)
Chloride: 97 mmol/L — ABNORMAL LOW (ref 98–111)
Creatinine, Ser: 8.11 mg/dL — ABNORMAL HIGH (ref 0.44–1.00)
GFR, Estimated: 5 mL/min — ABNORMAL LOW (ref >60)
Glucose, Bld: 95 mg/dL (ref 70–99)
Phosphorus: 3.9 mg/dL (ref 2.5–4.6)
Potassium: 3.8 mmol/L (ref 3.5–5.1)
Sodium: 135 mmol/L (ref 135–145)

## 2021-08-18 LAB — CBC
HCT: 28 % — ABNORMAL LOW (ref 36.0–46.0)
Hemoglobin: 8.9 g/dL — ABNORMAL LOW (ref 12.0–15.0)
MCH: 36.9 pg — ABNORMAL HIGH (ref 26.0–34.0)
MCHC: 31.8 g/dL (ref 30.0–36.0)
MCV: 116.2 fL — ABNORMAL HIGH (ref 80.0–100.0)
Platelets: ADEQUATE 10*3/uL (ref 150–400)
RBC: 2.41 MIL/uL — ABNORMAL LOW (ref 3.87–5.11)
RDW: 19.9 % — ABNORMAL HIGH (ref 11.5–15.5)
WBC: 6.3 10*3/uL (ref 4.0–10.5)
nRBC: 0 % (ref 0.0–0.2)

## 2021-08-18 LAB — HEPARIN LEVEL (UNFRACTIONATED)
Heparin Unfractionated: 0.27 IU/mL — ABNORMAL LOW (ref 0.30–0.70)
Heparin Unfractionated: 0.46 IU/mL (ref 0.30–0.70)

## 2021-08-18 LAB — MRSA NEXT GEN BY PCR, NASAL: MRSA by PCR Next Gen: NOT DETECTED

## 2021-08-18 MED ORDER — HEPARIN BOLUS VIA INFUSION
1000.0000 [IU] | Freq: Once | INTRAVENOUS | Status: AC
  Administered 2021-08-18: 1000 [IU] via INTRAVENOUS
  Filled 2021-08-18: qty 1000

## 2021-08-18 NOTE — Progress Notes (Signed)
Progress Note  Patient Name: Shawna [name] Date of Encounter: 08/18/2021  [facility] Cardiologist: Carlyle Dolly, MD    Subjective   No complaints [place] this am Converted to SB rates 50's   Inpatient Medications    Scheduled Meds:  aspirin EC  81 mg Oral Daily   Chlorhexidine Gluconate Cloth  6 each Topical Q0600   Chlorhexidine Gluconate Cloth  6 each Topical Q0600   darbepoetin (ARANESP) injection - DIALYSIS  150 mcg Intravenous Q Mon-HD   levothyroxine  25 mcg Oral Daily   metoprolol tartrate  12.5 mg Oral BID   midodrine  10 mg Oral TID WC   rosuvastatin  10 mg Oral Daily   sevelamer carbonate  2,400 mg Oral TID WC   Continuous Infusions:  sodium chloride     sodium chloride     amiodarone 15 mg/hr (08/17/21 2100)   heparin 850 Units/hr (08/18/21 0809)   PRN Meds: sodium chloride, sodium chloride, lidocaine (PF), lidocaine-prilocaine, nitroGLYCERIN, pentafluoroprop-tetrafluoroeth, polyethylene glycol   Vital Signs    Vitals:   08/18/21 0000 08/18/21 0100 08/18/21 0700 08/18/21 0742  BP: (!) 164/34 (!) 175/34 (!) 159/29   Pulse:  (!) 51 (!) 48 (!) 49  Resp: 20 17 15 15   Temp:      TempSrc:      SpO2:  98% 97% 99%  Weight:      Height:        Intake/Output Summary (Last 24 hours) at 08/18/2021 0905 Last data filed at 08/18/2021 0500 Gross per 24 hour  Intake 696.78 ml  Output --  Net 696.78 ml   Last 3 Weights 08/17/2021 08/17/2021 08/16/2021  Weight (lbs) 120 lb 2.4 oz 120 lb 1.6 oz 123 lb 7.3 oz  Weight (kg) 54.5 kg 54.477 kg 56 kg      Telemetry    SB rates 50's   ECG       Physical Exam    GEN: No acute distress.   Neck: No JVD Cardiac: RRR 130/m Respiratory: Clear to auscultation bilaterally. GI: Soft, nontender, non-distended  MS: No edema; No deformity. Neuro:  Nonfocal  Psych: Normal affect   Labs    High Sensitivity Troponin:   Recent Labs  Lab 08/15/21 1205 08/15/21 1401 08/16/21 0511 08/16/21 0851  TROPONINIHS 27* 27*  46* 49*     Chemistry Recent Labs  Lab 08/15/21 1205 08/15/21 1401 08/16/21 0511  NA 136  --  140  K 3.3*  --  3.3*  CL 98  --  103  CO2 27  --  25  GLUCOSE 173*  --  82  BUN 42*  --  53*  CREATININE 5.25*  --  6.87*  CALCIUM 9.3  --  9.4  MG  --  1.9  --   GFRNONAA 8*  --  6*  ANIONGAP 11  --  12    Lipids No results for input(s): CHOL, TRIG, HDL, LABVLDL, LDLCALC, CHOLHDL in the last 168 hours.  Hematology Recent Labs  Lab 08/16/21 0511 08/17/21 0636 08/18/21 0423  WBC 4.6 6.6 6.3  RBC 2.41* 2.78* 2.41*  HGB 8.9* 10.0* 8.9*  HCT 27.8* 32.1* 28.0*  MCV 115.4* 115.5* 116.2*  MCH 36.9* 36.0* 36.9*  MCHC 32.0 31.2 31.8  RDW 19.9* 20.1* 19.9*  PLT 163 226 PLATELET CLUMPS NOTED ON SMEAR, COUNT APPEARS ADEQUATE   Thyroid No results for input(s): TSH, FREET4 in the last 168 hours.  BNP Recent Labs  Lab 08/15/21 2040  BNP 1,051.0*    DDimer No results for input(s): DDIMER in the last 168 hours.   Radiology    No results found.  Cardiac Studies      Echocardiogram: 07/2021 IMPRESSIONS     1. Left ventricular ejection fraction, by estimation, is 30 to 35%. The  left ventricle has moderately decreased function. The left ventricle  demonstrates regional wall motion abnormalities (see scoring  diagram/findings for description). There is mild  left ventricular hypertrophy. Left ventricular diastolic parameters are  consistent with Grade II diastolic dysfunction (pseudonormalization).  Elevated left atrial pressure.   2. Right ventricular systolic function is moderately reduced. The right  ventricular size is normal. There is mildly elevated pulmonary artery  systolic pressure. The estimated right ventricular systolic pressure is  87.6 mmHg.   3. Left atrial size was severely dilated.   4. Right atrial size was mildly dilated.   5. The mitral valve is degenerative. Moderate mitral valve regurgitation.  Mild to moderate mitral stenosis. MG 27mmHg at 70bpm, MVA  1.2 cm^2 by  continuity equation. Moderate mitral annular calcification.   6. Tricuspid valve regurgitation is mild to moderate.   7. The aortic valve is tricuspid. Aortic valve regurgitation is not  visualized. Aortic valve sclerosis/calcification is present, without any  evidence of aortic stenosis.   8. The inferior vena cava is normal in size with greater than 50%  respiratory variability, suggesting right atrial pressure of 3 mmHg.      LHC: 07/2021 Mid LM to Dist LM lesion is 60% stenosed.   Prox LAD lesion is 90% stenosed.   Ost Cx to Mid Cx lesion is 99% stenosed.   Ost RCA to Prox RCA lesion is 100% stenosed.   Ost Ramus to Ramus lesion is 80% stenosed.   Ost 1st Diag to 1st Diag lesion is 100% stenosed.   Ost RPDA to RPDA lesion is 75% stenosed.   2nd Mrg lesion is 60% stenosed.   Dist LAD lesion is 70% stenosed.   LIMA and is large.   SVG and is large.   SVG and is normal in caliber.   SVG and is large.   The graft exhibits no disease.   The graft exhibits no disease.   The graft exhibits no disease.   LV end diastolic pressure is mildly elevated.   The left ventricular ejection fraction is 35-45% by visual estimate.    IMPRESSION: Ms. Eddington anatomy is for all intents and purposes unchanged from her prior cath performed by Dr. [place] in 2020.  All her grafts are patent.  Her LVEDP was 18.  Her EF looks to be in the 40 to 45% range with an anteroapical wall motion abnormality.  I suspect that her chest pain and mild enzyme leak was related to "demand ischemia".  Medical therapy will be recommended.  If femoral angiogram was performed and the stick appeared to be slightly high and therefore Mynx closure was not performed.  The patient will need a manual hold in the recovery bay prior to going to the floor.  Dr. Domenic Polite, the patient's attending cardiologist, was notified of these results.  Patient Profile     81 y.o. female with history of CAD status post CABG (distal  small vessel disease), systolic heart failure, ESRD on hemodialysis, recurrent GI bleed, chronic anemia who was admitted on 08/15/2021 with recurrent atrial flutter and chest pain symptoms.  Course is complicated by hypotension not able to tolerate hemodialysis.    Assessment & Plan  Aflutter with RVR-converted to NSR continue iv amiodarone today   Chest pain in the setting of Aflutter with RVR, troponins flat, cath last month medical therapy.  s/p CABG in 2013, DES to D1 in 10/2016 and cath in 07/2018 showing patent grafts with occlusion of D1 at prior stent site and progression of PDA disease with medical management recommended. Repeat catheterization 07/2021 showed patent grafts with known PDA disease and overall similar to her prior cath in 07/2018. Currently pain free.  Hypotension-not tolerating dialysis, should be better in NSR continue midodrine    Anemia - She did require a transfusion during her recent admission ut says Hb drop again this am 28 from 32.1 per primary service     HLD - She was transitioned from Simvastatin to Crestor during her admission and I have asked her to make sure she is taking this. LDL was at 48 on 07/16/2021.     ESRD - On HD - MWF schedule.    For questions or updates, please contact [place] Please consult [email or web address] for contact info under      Signed, Jenkins Rouge, MD  08/18/2021, 9:05 AM

## 2021-08-18 NOTE — Progress Notes (Signed)
Subjective:  For HD today.  In sinus brady in low-mid 50s.  Feeling well, talkative, on RA.  Objective Vital signs in last 24 hours: Vitals:   08/18/21 0000 08/18/21 0100 08/18/21 0700 08/18/21 0742  BP: (!) 164/34 (!) 175/34 (!) 159/29   Pulse:  (!) 51 (!) 48 (!) 49  Resp: 20 17 15 15   Temp:      TempSrc:      SpO2:  98% 97% 99%  Weight:      Height:       Weight change: -1.5 kg  Intake/Output Summary (Last 24 hours) at 08/18/2021 0953 Last data filed at 08/18/2021 0500 Gross per 24 hour  Intake 696.78 ml  Output --  Net 696.78 ml   Dialysis Orders:  [facility] - MWF-  3.5 hours  EDW 54. HD Bath 2k/2.5 ca, Heparin none. Access LUE AVG BFR 300, DFR 500- 15 guage. Micera 200 mcg IV every 2 weeks   Assessment/Plan: 81 year old F with multiple medical issues and ESRD-  frequent hospitalizations 1 CP-  per cards-  just had a cardiac cath-  plan was for medical management-  on heparin gtt currently  2 ESRD: MWF- [facility]-  have been having trouble with UF-  now on midodrine.  HD on schedule today 3 Hypertension/vol: minimal UF goal 4. Anemia of ESRD: No overt losses--> on ESA 5. Metabolic Bone Disease: will continue home renvela -  dont know other routine meds 6.  Dispo: In ICU for now on gtt   [name]    Labs: Basic Metabolic Panel: Recent Labs  Lab 08/15/21 1205 08/16/21 0511  NA 136 140  K 3.3* 3.3*  CL 98 103  CO2 27 25  GLUCOSE 173* 82  BUN 42* 53*  CREATININE 5.25* 6.87*  CALCIUM 9.3 9.4   Liver Function Tests: No results for input(s): AST, ALT, ALKPHOS, BILITOT, PROT, ALBUMIN in the last 168 hours. No results for input(s): LIPASE, AMYLASE in the last 168 hours. No results for input(s): AMMONIA in the last 168 hours. CBC: Recent Labs  Lab 08/15/21 1205 08/16/21 0511 08/17/21 0636 08/18/21 0423  WBC 5.5 4.6 6.6 6.3  NEUTROABS 4.0  --   --   --   HGB 9.7* 8.9* 10.0* 8.9*  HCT 31.2* 27.8* 32.1* 28.0*  MCV 117.3* 115.4* 115.5* 116.2*  PLT  PLATELET CLUMPS NOTED ON SMEAR 163 226 PLATELET CLUMPS NOTED ON SMEAR, COUNT APPEARS ADEQUATE   Cardiac Enzymes: No results for input(s): CKTOTAL, CKMB, CKMBINDEX, TROPONINI in the last 168 hours. CBG: Recent Labs  Lab 08/17/21 0547 08/17/21 0815  GLUCAP 104* 130*    Iron Studies: No results for input(s): IRON, TIBC, TRANSFERRIN, FERRITIN in the last 72 hours. Studies/Results: No results found. Medications: Infusions:  sodium chloride     sodium chloride     amiodarone 15 mg/hr (08/17/21 2100)   heparin 850 Units/hr (08/18/21 0809)    Scheduled Medications:  aspirin EC  81 mg Oral Daily   Chlorhexidine Gluconate Cloth  6 each Topical Q0600   Chlorhexidine Gluconate Cloth  6 each Topical Q0600   darbepoetin (ARANESP) injection - DIALYSIS  150 mcg Intravenous Q Mon-HD   levothyroxine  25 mcg Oral Daily   metoprolol tartrate  12.5 mg Oral BID   midodrine  10 mg Oral TID WC   rosuvastatin  10 mg Oral Daily   sevelamer carbonate  2,400 mg Oral TID WC    have reviewed scheduled and prn medications.  Physical Exam: General:  alert, eating breakfast-  nad Heart: tachy-  irreg Lungs: mostly clear Abdomen: soft, non tender Extremities: no edema Dialysis Access: left AVG - patent     08/18/2021,9:53 AM  LOS: 2 days

## 2021-08-18 NOTE — Progress Notes (Signed)
ANTICOAGULATION CONSULT NOTE -Consult  Pharmacy Consult for heparin Indication: chest pain/ACS  Allergies  Allergen Reactions   Amlodipine Swelling   Aspirin Other (See Comments)    High Doses Mess up her stomach; "makes my bowels have blood in them". Takes 81 mg EC Aspirin    Nitrofurantoin Hives   Penicillins Other (See Comments)    SYNCOPE? , "makes me real weak when I take it; like I'll pass out"  Has patient had a PCN reaction causing immediate rash, facial/tongue/throat swelling, SOB or lightheadedness with hypotension: Yes Has patient had a PCN reaction causing severe rash involving mucus membranes or skin necrosis: no Has patient had a PCN reaction that required hospitalization no Has patient had a PCN reaction occurring within the last 10 years: no If all of the above    Bactrim [Sulfamethoxazole-Trimethoprim] Rash   Contrast Media [Iodinated Contrast Media] Itching   Iron Itching and Other (See Comments)    "they gave me iron in dialysis; had to give me Benadryl cause I had to have the iron" (05/02/2012)   Tylenol [Acetaminophen] Itching and Other (See Comments)    Makes her feet on fire per pt   Gabapentin Other (See Comments)    Unknown reaction   Iron Sucrose    Ranexa [Ranolazine] Other (See Comments)    Myoclonus-hospitalized    Sucroferric Oxyhydroxide    Dexilant [Dexlansoprazole] Other (See Comments)    Upset stomach   Hydralazine Itching    Has tolerated while inpatient Has tolerated while inpatient   Levaquin [Levofloxacin In D5w] Rash   Morphine And Related Itching and Other (See Comments)    Itching in feet   Pantoprazole Rash   Plavix [Clopidogrel Bisulfate] Rash   Protonix [Pantoprazole Sodium] Rash   Venofer [Ferric Oxide] Itching and Other (See Comments)    Patient reports using Benadryl prior to doses as [place]    Patient Measurements: Height: 5\' 2"  (157.5 cm) Weight: 54.5 kg (120 lb 2.4 oz) IBW/kg (Calculated) : 50.1 HEPARIN DW  (KG): 54.5   Vital Signs: Temp: 97.7 F (36.5 C) (02/08 1715) Temp Source: Oral (02/08 1715) BP: 155/27 (02/08 1730) Pulse Rate: 36 (02/08 1730)  Labs: Recent Labs    08/16/21 0511 08/16/21 0851 08/16/21 1723 08/17/21 0636 08/17/21 1654 08/18/21 0423 08/18/21 1617  HGB 8.9*  --   --  10.0*  --  8.9*  --   HCT 27.8*  --   --  32.1*  --  28.0*  --   PLT 163  --   --  226  --  PLATELET CLUMPS NOTED ON SMEAR, COUNT APPEARS ADEQUATE  --   HEPARINUNFRC  --   --    < > 0.16* 0.31 0.27* 0.46  CREATININE 6.87*  --   --   --   --   --  8.11*  TROPONINIHS 46* 49*  --   --   --   --   --    < > = values in this interval not displayed.     Estimated Creatinine Clearance: 4.3 mL/min (A) (by C-G formula based on SCr of 8.11 mg/dL (H)).   Medical History: Past Medical History:  Diagnosis Date   Acute on chronic respiratory failure with hypoxia ([place]) 10/10/2016   Anxiety    Arthritis    AVM (arteriovenous malformation) of colon    CAD (coronary artery disease)    a. s/p CABG in 2013 b. DES to D1 in 10/2016. c. cath in 07/2018  showing patent grafts with occlusion of D1 at prior stent site and progression of PDA disease --> medical management recommended   Carotid artery disease ([place])    a. 51-70% LICA, 0/1749    Chronic anemia    Chronic bronchitis (HCC)    Chronic diastolic CHF (congestive heart failure) ([place])    a. 02/2012 Echo EF 60-65%, nl wall motion, Gr 1 DD, mod MR   Colon cancer ([place]) 1992   Esophageal stricture    ESRD on hemodialysis ([place])    ESRD due to HTN, started dialysis 2011 and gets HD at [facility] with Dr Hinda Lenis on MWF schedule.  Access is LUA AVF as of Sept 2014.    GERD (gastroesophageal reflux disease)    High cholesterol 12/2011   History of blood transfusion 07/2011; 12/2011; 01/2012 X 2; 04/2012   History of gout    History of lower GI bleeding    Hypertension    Iron deficiency anemia    Jugular vein occlusion, right (HCC)    Mitral regurgitation     a. Moderate by echo, 02/2012   Myocardial infarction [facility])    NSVT (nonsustained ventricular tachycardia)    Ovarian cancer ([place]) 1992   PAF (paroxysmal atrial fibrillation) ([place])    Pneumonia ~ 2009   PUD (peptic ulcer disease)    TIA (transient ischemic attack)     Medications:  Medications Prior to Admission  Medication Sig Dispense Refill Last Dose   ALPRAZolam (XANAX) 0.5 MG tablet Take 0.5 mg by mouth at bedtime.   08/15/2021   amiodarone (PACERONE) 200 MG tablet Take 1 tablet (200 mg total) by mouth daily. 90 tablet 2 08/15/2021   aspirin EC 81 MG tablet Take 81 mg by mouth daily.    08/15/2021   hydrOXYzine (VISTARIL) 100 MG capsule Take 100 mg by mouth daily as needed for anxiety.   unk   isosorbide mononitrate (IMDUR) 30 MG 24 hr tablet Take 3 tablets (90 mg total) by mouth 2 (two) times daily. 60 tablet 3 08/15/2021   levothyroxine (SYNTHROID) 25 MCG tablet Take 1 tablet (25 mcg total) by mouth daily. 90 tablet 0 08/15/2021   lidocaine-prilocaine (EMLA) cream Apply 1 application topically every Monday, Wednesday, and Friday. Prior to dialysis   Past Week   loperamide (IMODIUM A-D) 2 MG tablet Take 2 tablets (4 mg total) by mouth daily as needed for diarrhea or loose stools.   unk   metoprolol succinate (TOPROL XL) 25 MG 24 hr tablet Take 1 tablet (25 mg total) by mouth daily. 90 tablet 3 08/15/2021 at 0800   midodrine (PROAMATINE) 5 MG tablet Take Monday, Wednesday, Friday morning BEFORE dialysis 48 tablet 6 Past Week   multivitamin (RENA-VIT) TABS tablet Take 1 tablet by mouth daily.   08/15/2021   nitroGLYCERIN (NITROSTAT) 0.4 MG SL tablet Place 1 tablet (0.4 mg total) under the tongue every 5 (five) minutes x 3 doses as needed for chest pain (if no relief after 2nd dose, proceed to the ED for an evaluation or call 911). 25 tablet 3 unk   omeprazole (PRILOSEC) 20 MG capsule Take 1 capsule (20 mg total) by mouth daily. 90 capsule 3 08/15/2021   rosuvastatin (CRESTOR) 10 MG tablet Take 1 tablet  (10 mg total) by mouth daily. 90 tablet 3 08/15/2021   sevelamer carbonate (RENVELA) 800 MG tablet Take 3 tablets (2,400 mg total) by mouth 3 (three) times daily with meals.   08/15/2021   Scheduled:   aspirin EC  81 mg Oral Daily   Chlorhexidine Gluconate Cloth  6 each Topical Q0600   Chlorhexidine Gluconate Cloth  6 each Topical Q0600   darbepoetin (ARANESP) injection - DIALYSIS  150 mcg Intravenous Q Mon-HD   levothyroxine  25 mcg Oral Daily   metoprolol tartrate  12.5 mg Oral BID   midodrine  10 mg Oral TID WC   rosuvastatin  10 mg Oral Daily   sevelamer carbonate  2,400 mg Oral TID WC    Assessment: 81yo female c/o CP similar to CP that led to a recent hospital admission, had recurrent CP this am that responded well to O2 and SL NTG >> to start heparin infusion.  Of note on recent admission heparin was stopped d/t drop in Hgb (HIT Ab negative) so will aim for a low goal and adjust rates cautiously.  HL 0.46- therapeutic  Goal of Therapy:  Heparin level 0.3-0.5 units/ml Monitor platelets by anticoagulation protocol: Yes   Plan:  Continue heparin infusion at 850 units/hr Will recheck HL in 8 hours  Continue to monitor H&H and platelets  Thomasenia Sales, PharmD, [facility] Clinical Pharmacist  08/18/2021 5:40 PM

## 2021-08-18 NOTE — Progress Notes (Addendum)
ANTICOAGULATION CONSULT NOTE -Consult  Pharmacy Consult for heparin Indication: chest pain/ACS  Allergies  Allergen Reactions   Amlodipine Swelling   Aspirin Other (See Comments)    High Doses Mess up her stomach; "makes my bowels have blood in them". Takes 81 mg EC Aspirin    Nitrofurantoin Hives   Penicillins Other (See Comments)    SYNCOPE? , "makes me real weak when I take it; like I'll pass out"  Has patient had a PCN reaction causing immediate rash, facial/tongue/throat swelling, SOB or lightheadedness with hypotension: Yes Has patient had a PCN reaction causing severe rash involving mucus membranes or skin necrosis: no Has patient had a PCN reaction that required hospitalization no Has patient had a PCN reaction occurring within the last 10 years: no If all of the above    Bactrim [Sulfamethoxazole-Trimethoprim] Rash   Contrast Media [Iodinated Contrast Media] Itching   Iron Itching and Other (See Comments)    "they gave me iron in dialysis; had to give me Benadryl cause I had to have the iron" (05/02/2012)   Tylenol [Acetaminophen] Itching and Other (See Comments)    Makes her feet on fire per pt   Gabapentin Other (See Comments)    Unknown reaction   Iron Sucrose    Ranexa [Ranolazine] Other (See Comments)    Myoclonus-hospitalized    Sucroferric Oxyhydroxide    Dexilant [Dexlansoprazole] Other (See Comments)    Upset stomach   Hydralazine Itching    Has tolerated while inpatient Has tolerated while inpatient   Levaquin [Levofloxacin In D5w] Rash   Morphine And Related Itching and Other (See Comments)    Itching in feet   Pantoprazole Rash   Plavix [Clopidogrel Bisulfate] Rash   Protonix [Pantoprazole Sodium] Rash   Venofer [Ferric Oxide] Itching and Other (See Comments)    Patient reports using Benadryl prior to doses as [place]    Patient Measurements: Height: 5\' 2"  (157.5 cm) Weight: 54.5 kg (120 lb 2.4 oz) IBW/kg (Calculated) : 50.1 HEPARIN DW  (KG): 54.5   Vital Signs: Temp: 97.6 F (36.4 C) (02/07 2000) Temp Source: Oral (02/07 2000) BP: 159/29 (02/08 0700) Pulse Rate: 49 (02/08 0742)  Labs: Recent Labs    08/15/21 1205 08/15/21 1401 08/16/21 0511 08/16/21 0851 08/16/21 1723 08/17/21 0636 08/17/21 1654 08/18/21 0423  HGB 9.7*  --  8.9*  --   --  10.0*  --  8.9*  HCT 31.2*  --  27.8*  --   --  32.1*  --  28.0*  PLT PLATELET CLUMPS NOTED ON SMEAR  --  163  --   --  226  --  PLATELET CLUMPS NOTED ON SMEAR, COUNT APPEARS ADEQUATE  HEPARINUNFRC  --   --   --   --    < > 0.16* 0.31 0.27*  CREATININE 5.25*  --  6.87*  --   --   --   --   --   TROPONINIHS 27* 27* 46* 49*  --   --   --   --    < > = values in this interval not displayed.     Estimated Creatinine Clearance: 5.1 mL/min (A) (by C-G formula based on SCr of 6.87 mg/dL (H)).   Medical History: Past Medical History:  Diagnosis Date   Acute on chronic respiratory failure with hypoxia ([place]) 10/10/2016   Anxiety    Arthritis    AVM (arteriovenous malformation) of colon    CAD (coronary artery disease)  a. s/p CABG in 2013 b. DES to D1 in 10/2016. c. cath in 07/2018 showing patent grafts with occlusion of D1 at prior stent site and progression of PDA disease --> medical management recommended   Carotid artery disease ([place])    a. 29-52% LICA, 02/4131    Chronic anemia    Chronic bronchitis (HCC)    Chronic diastolic CHF (congestive heart failure) ([place])    a. 02/2012 Echo EF 60-65%, nl wall motion, Gr 1 DD, mod MR   Colon cancer ([place]) 1992   Esophageal stricture    ESRD on hemodialysis ([place])    ESRD due to HTN, started dialysis 2011 and gets HD at [facility] with Dr Hinda Lenis on MWF schedule.  Access is LUA AVF as of Sept 2014.    GERD (gastroesophageal reflux disease)    High cholesterol 12/2011   History of blood transfusion 07/2011; 12/2011; 01/2012 X 2; 04/2012   History of gout    History of lower GI bleeding    Hypertension    Iron deficiency anemia     Jugular vein occlusion, right (HCC)    Mitral regurgitation    a. Moderate by echo, 02/2012   Myocardial infarction [facility])    NSVT (nonsustained ventricular tachycardia)    Ovarian cancer ([place]) 1992   PAF (paroxysmal atrial fibrillation) ([place])    Pneumonia ~ 2009   PUD (peptic ulcer disease)    TIA (transient ischemic attack)     Medications:  Medications Prior to Admission  Medication Sig Dispense Refill Last Dose   ALPRAZolam (XANAX) 0.5 MG tablet Take 0.5 mg by mouth at bedtime.   08/15/2021   amiodarone (PACERONE) 200 MG tablet Take 1 tablet (200 mg total) by mouth daily. 90 tablet 2 08/15/2021   aspirin EC 81 MG tablet Take 81 mg by mouth daily.    08/15/2021   hydrOXYzine (VISTARIL) 100 MG capsule Take 100 mg by mouth daily as needed for anxiety.   unk   isosorbide mononitrate (IMDUR) 30 MG 24 hr tablet Take 3 tablets (90 mg total) by mouth 2 (two) times daily. 60 tablet 3 08/15/2021   levothyroxine (SYNTHROID) 25 MCG tablet Take 1 tablet (25 mcg total) by mouth daily. 90 tablet 0 08/15/2021   lidocaine-prilocaine (EMLA) cream Apply 1 application topically every Monday, Wednesday, and Friday. Prior to dialysis   Past Week   loperamide (IMODIUM A-D) 2 MG tablet Take 2 tablets (4 mg total) by mouth daily as needed for diarrhea or loose stools.   unk   metoprolol succinate (TOPROL XL) 25 MG 24 hr tablet Take 1 tablet (25 mg total) by mouth daily. 90 tablet 3 08/15/2021 at 0800   midodrine (PROAMATINE) 5 MG tablet Take Monday, Wednesday, Friday morning BEFORE dialysis 48 tablet 6 Past Week   multivitamin (RENA-VIT) TABS tablet Take 1 tablet by mouth daily.   08/15/2021   nitroGLYCERIN (NITROSTAT) 0.4 MG SL tablet Place 1 tablet (0.4 mg total) under the tongue every 5 (five) minutes x 3 doses as needed for chest pain (if no relief after 2nd dose, proceed to the ED for an evaluation or call 911). 25 tablet 3 unk   omeprazole (PRILOSEC) 20 MG capsule Take 1 capsule (20 mg total) by mouth daily. 90  capsule 3 08/15/2021   rosuvastatin (CRESTOR) 10 MG tablet Take 1 tablet (10 mg total) by mouth daily. 90 tablet 3 08/15/2021   sevelamer carbonate (RENVELA) 800 MG tablet Take 3 tablets (2,400 mg total) by mouth 3 (  three) times daily with meals.   08/15/2021   Scheduled:   aspirin EC  81 mg Oral Daily   Chlorhexidine Gluconate Cloth  6 each Topical Q0600   Chlorhexidine Gluconate Cloth  6 each Topical Q0600   darbepoetin (ARANESP) injection - DIALYSIS  150 mcg Intravenous Q Mon-HD   levothyroxine  25 mcg Oral Daily   metoprolol tartrate  12.5 mg Oral BID   midodrine  10 mg Oral TID WC   rosuvastatin  10 mg Oral Daily   sevelamer carbonate  2,400 mg Oral TID WC    Assessment: 81yo female c/o CP similar to CP that led to a recent hospital admission, had recurrent CP this am that responded well to O2 and SL NTG >> to start heparin infusion.  Of note on recent admission heparin was stopped d/t drop in Hgb (HIT Ab negative) so will aim for a low goal and adjust rates cautiously.  HL 0.27- subtherapeutic , no issues with infusion per RN  Goal of Therapy:  Heparin level 0.3-0.5 units/ml Monitor platelets by anticoagulation protocol: Yes   Plan:  Rebolus heparin 1000 units IV x 1 Increase heparin infusion to  850 units/hr Will recheck HL in 8 hours  Continue to monitor H&H and platelets  Isac Sarna, BS [place], BCPS Clinical Pharmacist Pager [phone] 08/18/2021 7:56 AM

## 2021-08-18 NOTE — Procedures (Signed)
° °  HEMODIALYSIS TREATMENT NOTE:  3.5 hour heparin-free session completed using left upper arm AVG (17g/antegrade). Goal met: 1.5 liters removed.  BP stable,  SB 50s at start of treatment, then Afib 70s-80s with one non-sustained excursion to 115.   All blood was returned and hemostasis was achieved in 15 minutes.  No changes from pre-HD assessment.   Rockwell [place], RN

## 2021-08-18 NOTE — Progress Notes (Signed)
PROGRESS NOTE     Shawna [name], is a 81 y.o. female, DOB - July 25, 1940, [id number]  Admit date - 08/15/2021   Admitting Physician Peyson Postema Denton Brick, MD  Outpatient Primary MD for the patient is [facility]  LOS - 2  Chief Complaint  Patient presents with   Chest Pain        Brief Narrative:   Per HPI:  Shawna [name] is a 81 y.o. female with medical history significant for ESRD, systolic and diastolic CHF, atrial fibrillation, ESRD, CABG, diabetes mellitus, GI bleed. Patient presented to the ED with complaints of onset of chest pain this morning.   She reports chest pain on the right side and moved to the left.  No radiation, or SOB. Not worse with deep breathing or cough.   She goes for dialysis MWF, but the last few sessions they have not been able to pull off enough fluid due to her low blood pressure.  She reports the midodrine helps a bit.  She had to go again yesterday Saturday but they were able to pull just 1 L.  She feels her abdomen is bloated. She does not know her dry weight.  She does not make any urine.   She reports prior similar chest pains about 3 weeks ago, which she was admitted to the hospital 1/5-1/14 NSTEMI, started on heparin drip initially and was discontinued due to drop in hemoglobin, cardiac cath was done which showed-unchanged anatomy compared to her prior cath in 2020, patent grafts.  It was felt that her chest pain and mild enzyme leak (Troponin elevated 449) was secondary to demand ischemia, and medical therapy was recommended. She required 1 unit PRBC of blood.   ED Course:  Tachycardic HR 112-121.  RR 14- 30.  BP down to 88/54, subsequently improved systolic now 128N. Troponin 27 x 2.  EKG without significant changes from prior.  Potassium 3.3.   Chest x-ray clear. EDP talked to cardiology, recommended d/c Imdur, metoprolol Admitted for Hypotension, Tachycardia and chest pain    -Assessment and Plan: * Chest pain- (present on  admission) - In the setting of tachycardia in a patient with underlying CAD/status post prior CABG -Previously ruled out for ACS by EKG and cardiac enzymes -LHC 07/30/2018 without significant obstructive findings -Continue IV heparin, nitro -Continue aspirin, metoprolol and Crestor -Cardiology consult appreciated -Remains chest pain-free  Paroxysmal Atrial fibrillation/Flutter- (present on admission) - Converted to sinus rhythm overnight after being on IV amiodarone drip for over 24 hours  -Discussed with cardiologist Dr Johnsie Cancel, he recommends continue IV amiodarone drip for another 24 hours given how long it took patient to convert to sinus rhythm in the first place --Continue IV heparin, possible transition back to [name] on 08/19/2021 -Continue metoprolol -No further chest pains palpitations or dizziness at this time  HFpEF/Chronic diastolic CHF (congestive heart failure) ([place])- (present on admission) - stable, compensated -Continue metoprolol Continue to use hemodialysis sessions to address volume status  Hypokalemia- (present on admission) - Addressed with hemodialysis sessions  ESRD (end stage renal disease) on dialysis [facility]) - On hemodialysis Monday Wednesdays and Fridays -Midodrine to help with BP support especially with hemodialysis sessions -Nephrology consult appreciated  Goals of care, counseling/discussion - Palliative care consult appreciated -Request full code and full scope of treatment without limitations -Patient understands that she has many comorbid conditions and overall high risk for decompensation  Essential hypertension- (present on admission) -Metoprolol mostly for rate control -Continue midodrine discharge on hemodialysis days for pressure  support -Cardiology consult appreciated  Carotid artery disease ([place])- (present on admission) - Currently stable,- -continue aspirin and Crestor  Hx of CABG - Chest pain improved with nitro, see chest pain  above  Anemia in chronic kidney disease- (present on admission) - Anemia of chronic disease due to CKD, -Hgb improved, -Procrit/ESA agent by nephrology team -  HLD (hyperlipidemia)- (present on admission) - Continue Crestor   Disposition/Need for in-Hospital Stay- patient unable to be discharged at this time due to --patient remains in ICU due to need for continuous amiodarone drip for A-fib/a flutter -Possible discharge home in 1 to 2 days if rate control remains stable once patient is off IV amiodarone drip  Status is: Inpatient  Disposition: The patient is from: Home              Anticipated d/c is to: Home              Anticipated d/c date is: 1 day              Patient currently is not medically stable to d/c. Barriers: Not Clinically Stable-   Code Status :  -  Code Status: Full Code   Family Communication:   NA (patient is alert, awake and coherent)   Consults  :  Nephrology/Cardiology  DVT Prophylaxis  :   - SCDs /Iv Heparin  Lab Results  Component Value Date   PLT  08/18/2021    PLATELET CLUMPS NOTED ON SMEAR, COUNT APPEARS ADEQUATE    Inpatient Medications  Scheduled Meds:  aspirin EC  81 mg Oral Daily   Chlorhexidine Gluconate Cloth  6 each Topical Q0600   Chlorhexidine Gluconate Cloth  6 each Topical Q0600   darbepoetin (ARANESP) injection - DIALYSIS  150 mcg Intravenous Q Mon-HD   levothyroxine  25 mcg Oral Daily   metoprolol tartrate  12.5 mg Oral BID   midodrine  10 mg Oral TID WC   rosuvastatin  10 mg Oral Daily   sevelamer carbonate  2,400 mg Oral TID WC   Continuous Infusions:  sodium chloride     sodium chloride     amiodarone 15 mg/hr (08/17/21 2100)   heparin 850 Units/hr (08/18/21 0809)   PRN Meds:.sodium chloride, sodium chloride, lidocaine (PF), lidocaine-prilocaine, nitroGLYCERIN, pentafluoroprop-tetrafluoroeth, polyethylene glycol   Anti-infectives (From admission, onward)    None      Subjective: Shawna [name] today has no  fevers, no emesis,   -No further chest pain, no dizziness or palpitations- --converted to sinus rhythm overnight   Objective: Vitals:   08/18/21 0000 08/18/21 0100 08/18/21 0700 08/18/21 0742  BP: (!) 164/34 (!) 175/34 (!) 159/29   Pulse:  (!) 51 (!) 48 (!) 49  Resp: 20 17 15 15   Temp:      TempSrc:      SpO2:  98% 97% 99%  Weight:      Height:        Intake/Output Summary (Last 24 hours) at 08/18/2021 1005 Last data filed at 08/18/2021 0500 Gross per 24 hour  Intake 696.78 ml  Output --  Net 696.78 ml   Filed Weights   08/16/21 1550 08/17/21 0500 08/17/21 0950  Weight: 56 kg 54.5 kg 54.5 kg    Physical Exam  Gen:- Awake Alert,  in no apparent distress  HEENT:- [place].AT, No sclera icterus Neck-Supple Neck,No JVD,.  Lungs-  CTAB , fair symmetrical air movement CV- S1, S2 normal, irregular, no longer irregularly irregular Abd-  +ve B.Sounds, Abd Soft,  No tenderness,    Extremity/Skin:- No  edema, pedal pulses present  Psych-affect is appropriate, oriented x3 Neuro-no new focal deficits, no tremors MSK- Lt UE AVF  Data Reviewed: I have personally reviewed following labs and imaging studies  CBC: Recent Labs  Lab 08/15/21 1205 08/16/21 0511 08/17/21 0636 08/18/21 0423  WBC 5.5 4.6 6.6 6.3  NEUTROABS 4.0  --   --   --   HGB 9.7* 8.9* 10.0* 8.9*  HCT 31.2* 27.8* 32.1* 28.0*  MCV 117.3* 115.4* 115.5* 116.2*  PLT PLATELET CLUMPS NOTED ON SMEAR 163 226 PLATELET CLUMPS NOTED ON SMEAR, COUNT APPEARS ADEQUATE   Basic Metabolic Panel: Recent Labs  Lab 08/15/21 1205 08/15/21 1401 08/16/21 0511  NA 136  --  140  K 3.3*  --  3.3*  CL 98  --  103  CO2 27  --  25  GLUCOSE 173*  --  82  BUN 42*  --  53*  CREATININE 5.25*  --  6.87*  CALCIUM 9.3  --  9.4  MG  --  1.9  --    GFR: Estimated Creatinine Clearance: 5.1 mL/min (A) (by C-G formula based on SCr of 6.87 mg/dL (H)). Liver Function Tests: No results for input(s): AST, ALT, ALKPHOS, BILITOT, PROT, ALBUMIN in  the last 168 hours. No results for input(s): LIPASE, AMYLASE in the last 168 hours. No results for input(s): AMMONIA in the last 168 hours. Coagulation Profile: No results for input(s): INR, PROTIME in the last 168 hours. Cardiac Enzymes: No results for input(s): CKTOTAL, CKMB, CKMBINDEX, TROPONINI in the last 168 hours. BNP (last 3 results) No results for input(s): PROBNP in the last 8760 hours. HbA1C: No results for input(s): HGBA1C in the last 72 hours. CBG: Recent Labs  Lab 08/17/21 0547 08/17/21 0815  GLUCAP 104* 130*   Lipid Profile: No results for input(s): CHOL, HDL, LDLCALC, TRIG, CHOLHDL, LDLDIRECT in the last 72 hours. Thyroid Function Tests: No results for input(s): TSH, T4TOTAL, FREET4, T3FREE, THYROIDAB in the last 72 hours. Anemia Panel: No results for input(s): VITAMINB12, FOLATE, FERRITIN, TIBC, IRON, RETICCTPCT in the last 72 hours. Urine analysis:    Component Value Date/Time   COLORURINE YELLOW 12/16/2012 1919   APPEARANCEUR CLOUDY (A) 12/16/2012 1919   LABSPEC 1.009 12/16/2012 1919   PHURINE 7.5 12/16/2012 1919   GLUCOSEU NEGATIVE 12/16/2012 1919   HGBUR TRACE (A) 12/16/2012 1919   BILIRUBINUR NEGATIVE 12/16/2012 1919   KETONESUR NEGATIVE 12/16/2012 1919   PROTEINUR 100 (A) 12/16/2012 1919   UROBILINOGEN 0.2 12/16/2012 1919   NITRITE NEGATIVE 12/16/2012 1919   LEUKOCYTESUR SMALL (A) 12/16/2012 1919   Sepsis Labs: @LABRCNTIP (procalcitonin:4,lacticidven:4)  ) Recent Results (from the past 240 hour(s))  Resp Panel by RT-PCR (Flu A&B, Covid) Nasopharyngeal Swab     Status: None   Collection Time: 08/15/21  4:36 PM   Specimen: Nasopharyngeal Swab; Nasopharyngeal(NP) swabs in vial transport medium  Result Value Ref Range Status   SARS Coronavirus 2 by RT PCR NEGATIVE NEGATIVE Final    Comment: (NOTE) SARS-CoV-2 target nucleic acids are NOT DETECTED.  The SARS-CoV-2 RNA is generally detectable in upper respiratory specimens during the acute phase of  infection. The lowest concentration of SARS-CoV-2 viral copies this assay can detect is 138 copies/mL. A negative result does not preclude SARS-Cov-2 infection and should not be used as the sole basis for treatment or other patient management decisions. A negative result may occur with  improper specimen collection/handling, submission of specimen other than nasopharyngeal swab,  presence of viral mutation(s) within the areas targeted by this assay, and inadequate number of viral copies(<138 copies/mL). A negative result must be combined with clinical observations, patient history, and epidemiological information. The expected result is Negative.  Fact Sheet for Patients:  EntrepreneurPulse.com.au  Fact Sheet for Healthcare Providers:  IncredibleEmployment.be  This test is no t yet approved or cleared by the [place] FDA and  has been authorized for detection and/or diagnosis of SARS-CoV-2 by FDA under an Emergency Use Authorization (EUA). This EUA will remain  in effect (meaning this test can be used) for the duration of the COVID-19 declaration under Section 564(b)(1) of the Act, 21 U.S.C.section 360bbb-3(b)(1), unless the authorization is terminated  or revoked sooner.       Influenza A by PCR NEGATIVE NEGATIVE Final   Influenza B by PCR NEGATIVE NEGATIVE Final    Comment: (NOTE) The Xpert Xpress SARS-CoV-2/FLU/RSV plus assay is intended as an aid in the diagnosis of influenza from Nasopharyngeal swab specimens and should not be used as a sole basis for treatment. Nasal washings and aspirates are unacceptable for Xpert Xpress SARS-CoV-2/FLU/RSV testing.  Fact Sheet for Patients: EntrepreneurPulse.com.au  Fact Sheet for Healthcare Providers: IncredibleEmployment.be  This test is not yet approved or cleared by the [place] FDA and has been authorized for detection and/or diagnosis of SARS-CoV-2  by FDA under an Emergency Use Authorization (EUA). This EUA will remain in effect (meaning this test can be used) for the duration of the COVID-19 declaration under Section 564(b)(1) of the Act, 21 U.S.C. section 360bbb-3(b)(1), unless the authorization is terminated or revoked.  Performed at [facility], [address] [place], [place] [address]   MRSA Next Gen by PCR, Nasal     Status: None   Collection Time: 08/17/21 10:11 AM   Specimen: Nasal Mucosa; Nasal Swab  Result Value Ref Range Status   MRSA by PCR Next Gen NOT DETECTED NOT DETECTED Final    Comment: (NOTE) The GeneXpert MRSA Assay (FDA approved for NASAL specimens only), is one component of a comprehensive MRSA colonization surveillance program. It is not intended to diagnose MRSA infection nor to guide or monitor treatment for MRSA infections. Test performance is not FDA approved in patients less than 55 years old. Performed at [facility], [address], [place], [place] [address]     Radiology Studies: No results found.   Scheduled Meds:  aspirin EC  81 mg Oral Daily   Chlorhexidine Gluconate Cloth  6 each Topical Q0600   Chlorhexidine Gluconate Cloth  6 each Topical Q0600   darbepoetin (ARANESP) injection - DIALYSIS  150 mcg Intravenous Q Mon-HD   levothyroxine  25 mcg Oral Daily   metoprolol tartrate  12.5 mg Oral BID   midodrine  10 mg Oral TID WC   rosuvastatin  10 mg Oral Daily   sevelamer carbonate  2,400 mg Oral TID WC   Continuous Infusions:  sodium chloride     sodium chloride     amiodarone 15 mg/hr (08/17/21 2100)   heparin 850 Units/hr (08/18/21 0809)     LOS: 2 days    Roxan Hockey M.D on 08/18/2021 at 10:05 AM  Go to [email or web address] - for contact info  [facility] - Office  [phone]  If 7PM-7AM, please contact night-coverage [email or web address] Password [email or web address] 08/18/2021, 10:05 AM

## 2021-08-18 NOTE — Progress Notes (Signed)
ANTICOAGULATION CONSULT NOTE -Consult  Pharmacy Consult for heparin Indication: chest pain/ACS  Allergies  Allergen Reactions   Amlodipine Swelling   Aspirin Other (See Comments)    High Doses Mess up her stomach; "makes my bowels have blood in them". Takes 81 mg EC Aspirin    Nitrofurantoin Hives   Penicillins Other (See Comments)    SYNCOPE? , "makes me real weak when I take it; like I'll pass out"  Has patient had a PCN reaction causing immediate rash, facial/tongue/throat swelling, SOB or lightheadedness with hypotension: Yes Has patient had a PCN reaction causing severe rash involving mucus membranes or skin necrosis: no Has patient had a PCN reaction that required hospitalization no Has patient had a PCN reaction occurring within the last 10 years: no If all of the above    Bactrim [Sulfamethoxazole-Trimethoprim] Rash   Contrast Media [Iodinated Contrast Media] Itching   Iron Itching and Other (See Comments)    "they gave me iron in dialysis; had to give me Benadryl cause I had to have the iron" (05/02/2012)   Tylenol [Acetaminophen] Itching and Other (See Comments)    Makes her feet on fire per pt   Gabapentin Other (See Comments)    Unknown reaction   Iron Sucrose    Ranexa [Ranolazine] Other (See Comments)    Myoclonus-hospitalized    Sucroferric Oxyhydroxide    Dexilant [Dexlansoprazole] Other (See Comments)    Upset stomach   Hydralazine Itching    Has tolerated while inpatient Has tolerated while inpatient   Levaquin [Levofloxacin In D5w] Rash   Morphine And Related Itching and Other (See Comments)    Itching in feet   Pantoprazole Rash   Plavix [Clopidogrel Bisulfate] Rash   Protonix [Pantoprazole Sodium] Rash   Venofer [Ferric Oxide] Itching and Other (See Comments)    Patient reports using Benadryl prior to doses as [place]    Patient Measurements: Height: 5\' 2"  (157.5 cm) Weight: 54.5 kg (120 lb 2.4 oz) IBW/kg (Calculated) : 50.1 HEPARIN DW  (KG): 54.5   Vital Signs: Temp: 97.7 F (36.5 C) (02/08 1715) Temp Source: Oral (02/08 1715) BP: 155/27 (02/08 1730) Pulse Rate: 36 (02/08 1730)  Labs: Recent Labs    08/16/21 0511 08/16/21 0851 08/16/21 1723 08/17/21 0636 08/17/21 1654 08/18/21 0423 08/18/21 1617  HGB 8.9*  --   --  10.0*  --  8.9*  --   HCT 27.8*  --   --  32.1*  --  28.0*  --   PLT 163  --   --  226  --  PLATELET CLUMPS NOTED ON SMEAR, COUNT APPEARS ADEQUATE  --   HEPARINUNFRC  --   --    < > 0.16* 0.31 0.27* 0.46  CREATININE 6.87*  --   --   --   --   --  8.11*  TROPONINIHS 46* 49*  --   --   --   --   --    < > = values in this interval not displayed.     Estimated Creatinine Clearance: 4.3 mL/min (A) (by C-G formula based on SCr of 8.11 mg/dL (H)).   Medical History: Past Medical History:  Diagnosis Date   Acute on chronic respiratory failure with hypoxia ([place]) 10/10/2016   Anxiety    Arthritis    AVM (arteriovenous malformation) of colon    CAD (coronary artery disease)    a. s/p CABG in 2013 b. DES to D1 in 10/2016. c. cath in 07/2018  showing patent grafts with occlusion of D1 at prior stent site and progression of PDA disease --> medical management recommended   Carotid artery disease ([place])    a. 02-63% LICA, 01/8587    Chronic anemia    Chronic bronchitis (H[facility])    Chronic diastolic CHF (congestive heart failure) ([place])    a. 02/2012 Echo EF 60-65%, nl wall motion, Gr 1 DD, mod MR   Colon cancer ([place]) 1992   Esophageal stricture    ESRD on hemodialysis ([place])    ESRD due to HTN, started dialysis 2011 and gets HD at [facility] with Dr Hinda Lenis on MWF schedule.  Access is LUA AVF as of Sept 2014.    GERD (gastroesophageal reflux disease)    High cholesterol 12/2011   History of blood transfusion 07/2011; 12/2011; 01/2012 X 2; 04/2012   History of gout    History of lower GI bleeding    Hypertension    Iron deficiency anemia    Jugular vein occlusion, right (HCC)    Mitral regurgitation     a. Moderate by echo, 02/2012   Myocardial infarction [facility])    NSVT (nonsustained ventricular tachycardia)    Ovarian cancer ([place]) 1992   PAF (paroxysmal atrial fibrillation) ([place])    Pneumonia ~ 2009   PUD (peptic ulcer disease)    TIA (transient ischemic attack)     Medications:  Medications Prior to Admission  Medication Sig Dispense Refill Last Dose   ALPRAZolam (XANAX) 0.5 MG tablet Take 0.5 mg by mouth at bedtime.   08/15/2021   amiodarone (PACERONE) 200 MG tablet Take 1 tablet (200 mg total) by mouth daily. 90 tablet 2 08/15/2021   aspirin EC 81 MG tablet Take 81 mg by mouth daily.    08/15/2021   hydrOXYzine (VISTARIL) 100 MG capsule Take 100 mg by mouth daily as needed for anxiety.   unk   isosorbide mononitrate (IMDUR) 30 MG 24 hr tablet Take 3 tablets (90 mg total) by mouth 2 (two) times daily. 60 tablet 3 08/15/2021   levothyroxine (SYNTHROID) 25 MCG tablet Take 1 tablet (25 mcg total) by mouth daily. 90 tablet 0 08/15/2021   lidocaine-prilocaine (EMLA) cream Apply 1 application topically every Monday, Wednesday, and Friday. Prior to dialysis   Past Week   loperamide (IMODIUM A-D) 2 MG tablet Take 2 tablets (4 mg total) by mouth daily as needed for diarrhea or loose stools.   unk   metoprolol succinate (TOPROL XL) 25 MG 24 hr tablet Take 1 tablet (25 mg total) by mouth daily. 90 tablet 3 08/15/2021 at 0800   midodrine (PROAMATINE) 5 MG tablet Take Monday, Wednesday, Friday morning BEFORE dialysis 48 tablet 6 Past Week   multivitamin (RENA-VIT) TABS tablet Take 1 tablet by mouth daily.   08/15/2021   nitroGLYCERIN (NITROSTAT) 0.4 MG SL tablet Place 1 tablet (0.4 mg total) under the tongue every 5 (five) minutes x 3 doses as needed for chest pain (if no relief after 2nd dose, proceed to the ED for an evaluation or call 911). 25 tablet 3 unk   omeprazole (PRILOSEC) 20 MG capsule Take 1 capsule (20 mg total) by mouth daily. 90 capsule 3 08/15/2021   rosuvastatin (CRESTOR) 10 MG tablet Take 1 tablet  (10 mg total) by mouth daily. 90 tablet 3 08/15/2021   sevelamer carbonate (RENVELA) 800 MG tablet Take 3 tablets (2,400 mg total) by mouth 3 (three) times daily with meals.   08/15/2021   Scheduled:   aspirin EC  81 mg Oral Daily   Chlorhexidine Gluconate Cloth  6 each Topical Q0600   Chlorhexidine Gluconate Cloth  6 each Topical Q0600   darbepoetin (ARANESP) injection - DIALYSIS  150 mcg Intravenous Q Mon-HD   levothyroxine  25 mcg Oral Daily   metoprolol tartrate  12.5 mg Oral BID   midodrine  10 mg Oral TID WC   rosuvastatin  10 mg Oral Daily   sevelamer carbonate  2,400 mg Oral TID WC    Assessment: 81yo female c/o CP similar to CP that led to a recent hospital admission, had recurrent CP this am that responded well to O2 and SL NTG >> to start heparin infusion.  Of note on recent admission heparin was stopped d/t drop in Hgb (HIT Ab negative) so will aim for a low goal and adjust rates cautiously.  HL 0.46- therapeutic , no issues with infusion per RN  Goal of Therapy:  Heparin level 0.3-0.5 units/ml Monitor platelets by anticoagulation protocol: Yes   Plan:  Continue heparin infusion at 850 units/hr Will recheck HL in 8 hours and daily while on heparin Continue to monitor H&H and platelets  Isac Sarna, BS [place], BCPS Clinical Pharmacist Pager [phone] 08/18/2021 5:40 PM

## 2021-08-19 DIAGNOSIS — I480 Paroxysmal atrial fibrillation: Secondary | ICD-10-CM | POA: Diagnosis not present

## 2021-08-19 DIAGNOSIS — Z951 Presence of aortocoronary bypass graft: Secondary | ICD-10-CM | POA: Diagnosis not present

## 2021-08-19 DIAGNOSIS — N186 End stage renal disease: Secondary | ICD-10-CM | POA: Diagnosis not present

## 2021-08-19 DIAGNOSIS — I208 Other forms of angina pectoris: Secondary | ICD-10-CM

## 2021-08-19 DIAGNOSIS — I10 Essential (primary) hypertension: Secondary | ICD-10-CM | POA: Diagnosis not present

## 2021-08-19 LAB — HEPARIN LEVEL (UNFRACTIONATED)
Heparin Unfractionated: 0.18 IU/mL — ABNORMAL LOW (ref 0.30–0.70)
Heparin Unfractionated: 0.48 IU/mL (ref 0.30–0.70)

## 2021-08-19 LAB — CBC
HCT: 25.6 % — ABNORMAL LOW (ref 36.0–46.0)
Hemoglobin: 8 g/dL — ABNORMAL LOW (ref 12.0–15.0)
MCH: 37.2 pg — ABNORMAL HIGH (ref 26.0–34.0)
MCHC: 31.3 g/dL (ref 30.0–36.0)
MCV: 119.1 fL — ABNORMAL HIGH (ref 80.0–100.0)
Platelets: 174 10*3/uL (ref 150–400)
RBC: 2.15 MIL/uL — ABNORMAL LOW (ref 3.87–5.11)
RDW: 20.1 % — ABNORMAL HIGH (ref 11.5–15.5)
WBC: 6.3 10*3/uL (ref 4.0–10.5)
nRBC: 0.5 % — ABNORMAL HIGH (ref 0.0–0.2)

## 2021-08-19 LAB — GLUCOSE, CAPILLARY: Glucose-Capillary: 83 mg/dL (ref 70–99)

## 2021-08-19 LAB — OCCULT BLOOD X 1 CARD TO LAB, STOOL: Fecal Occult Bld: NEGATIVE

## 2021-08-19 MED ORDER — ALUM & MAG HYDROXIDE-SIMETH 200-200-20 MG/5ML PO SUSP
15.0000 mL | ORAL | Status: DC | PRN
  Administered 2021-08-19: 15 mL via ORAL
  Filled 2021-08-19: qty 30

## 2021-08-19 MED ORDER — NEBIVOLOL HCL 2.5 MG PO TABS
2.5000 mg | ORAL_TABLET | Freq: Every day | ORAL | Status: DC
  Administered 2021-08-19: 2.5 mg via ORAL
  Filled 2021-08-19 (×5): qty 1

## 2021-08-19 MED ORDER — MIDODRINE HCL 5 MG PO TABS
5.0000 mg | ORAL_TABLET | Freq: Three times a day (TID) | ORAL | Status: DC
  Administered 2021-08-19: 5 mg via ORAL
  Filled 2021-08-19: qty 1

## 2021-08-19 MED ORDER — MIDODRINE HCL 5 MG PO TABS
5.0000 mg | ORAL_TABLET | ORAL | Status: DC
  Administered 2021-08-20: 5 mg via ORAL

## 2021-08-19 MED ORDER — AMIODARONE HCL 200 MG PO TABS
100.0000 mg | ORAL_TABLET | Freq: Every day | ORAL | Status: DC
  Administered 2021-08-19: 100 mg via ORAL
  Filled 2021-08-19: qty 1

## 2021-08-19 NOTE — Progress Notes (Signed)
ANTICOAGULATION CONSULT NOTE -Consult  Pharmacy Consult for heparin Indication: chest pain/ACS  Allergies  Allergen Reactions   Amlodipine Swelling   Aspirin Other (See Comments)    High Doses Mess up her stomach; "makes my bowels have blood in them". Takes 81 mg EC Aspirin    Nitrofurantoin Hives   Penicillins Other (See Comments)    SYNCOPE? , "makes me real weak when I take it; like I'll pass out"  Has patient had a PCN reaction causing immediate rash, facial/tongue/throat swelling, SOB or lightheadedness with hypotension: Yes Has patient had a PCN reaction causing severe rash involving mucus membranes or skin necrosis: no Has patient had a PCN reaction that required hospitalization no Has patient had a PCN reaction occurring within the last 10 years: no If all of the above    Bactrim [Sulfamethoxazole-Trimethoprim] Rash   Contrast Media [Iodinated Contrast Media] Itching   Iron Itching and Other (See Comments)    "they gave me iron in dialysis; had to give me Benadryl cause I had to have the iron" (05/02/2012)   Tylenol [Acetaminophen] Itching and Other (See Comments)    Makes her feet on fire per pt   Gabapentin Other (See Comments)    Unknown reaction   Iron Sucrose    Ranexa [Ranolazine] Other (See Comments)    Myoclonus-hospitalized    Sucroferric Oxyhydroxide    Dexilant [Dexlansoprazole] Other (See Comments)    Upset stomach   Hydralazine Itching    Has tolerated while inpatient Has tolerated while inpatient   Levaquin [Levofloxacin In D5w] Rash   Morphine And Related Itching and Other (See Comments)    Itching in feet   Pantoprazole Rash   Plavix [Clopidogrel Bisulfate] Rash   Protonix [Pantoprazole Sodium] Rash   Venofer [Ferric Oxide] Itching and Other (See Comments)    Patient reports using Benadryl prior to doses as [place]    Patient Measurements: Height: 5\' 2"  (157.5 cm) Weight: 59.3 kg (130 lb 11.7 oz) IBW/kg (Calculated) : 50.1 HEPARIN DW  (KG): 54.5   Vital Signs: Temp: 97.8 F (36.6 C) (02/09 0600) Temp Source: Oral (02/09 0600) BP: 124/54 (02/09 0830) Pulse Rate: 55 (02/09 0830)  Labs: Recent Labs    08/17/21 0636 08/17/21 1654 08/18/21 0423 08/18/21 1617 08/19/21 0013 08/19/21 0858  HGB 10.0*  --  8.9*  --  8.0*  --   HCT 32.1*  --  28.0*  --  25.6*  --   PLT 226  --  PLATELET CLUMPS NOTED ON SMEAR, COUNT APPEARS ADEQUATE  --  174  --   HEPARINUNFRC 0.16*   < > 0.27* 0.46 0.18* 0.48  CREATININE  --   --   --  8.11*  --   --    < > = values in this interval not displayed.     Estimated Creatinine Clearance: 4.3 mL/min (A) (by C-G formula based on SCr of 8.11 mg/dL (H)).   Medical History: Past Medical History:  Diagnosis Date   Acute on chronic respiratory failure with hypoxia ([place]) 10/10/2016   Anxiety    Arthritis    AVM (arteriovenous malformation) of colon    CAD (coronary artery disease)    a. s/p CABG in 2013 b. DES to D1 in 10/2016. c. cath in 07/2018 showing patent grafts with occlusion of D1 at prior stent site and progression of PDA disease --> medical management recommended   Carotid artery disease ([place])    a. 36-62% LICA, 03/4764  Chronic anemia    Chronic bronchitis (HCC)    Chronic diastolic CHF (congestive heart failure) ([place])    a. 02/2012 Echo EF 60-65%, nl wall motion, Gr 1 DD, mod MR   Colon cancer ([place]) 1992   Esophageal stricture    ESRD on hemodialysis ([place])    ESRD due to HTN, started dialysis 2011 and gets HD at [facility] with Dr Hinda Lenis on MWF schedule.  Access is LUA AVF as of Sept 2014.    GERD (gastroesophageal reflux disease)    High cholesterol 12/2011   History of blood transfusion 07/2011; 12/2011; 01/2012 X 2; 04/2012   History of gout    History of lower GI bleeding    Hypertension    Iron deficiency anemia    Jugular vein occlusion, right (HCC)    Mitral regurgitation    a. Moderate by echo, 02/2012   Myocardial infarction [facility])    NSVT (nonsustained  ventricular tachycardia)    Ovarian cancer ([place]) 1992   PAF (paroxysmal atrial fibrillation) ([place])    Pneumonia ~ 2009   PUD (peptic ulcer disease)    TIA (transient ischemic attack)     Medications:  Medications Prior to Admission  Medication Sig Dispense Refill Last Dose   ALPRAZolam (XANAX) 0.5 MG tablet Take 0.5 mg by mouth at bedtime.   08/15/2021   amiodarone (PACERONE) 200 MG tablet Take 1 tablet (200 mg total) by mouth daily. 90 tablet 2 08/15/2021   aspirin EC 81 MG tablet Take 81 mg by mouth daily.    08/15/2021   hydrOXYzine (VISTARIL) 100 MG capsule Take 100 mg by mouth daily as needed for anxiety.   unk   isosorbide mononitrate (IMDUR) 30 MG 24 hr tablet Take 3 tablets (90 mg total) by mouth 2 (two) times daily. 60 tablet 3 08/15/2021   levothyroxine (SYNTHROID) 25 MCG tablet Take 1 tablet (25 mcg total) by mouth daily. 90 tablet 0 08/15/2021   lidocaine-prilocaine (EMLA) cream Apply 1 application topically every Monday, Wednesday, and Friday. Prior to dialysis   Past Week   loperamide (IMODIUM A-D) 2 MG tablet Take 2 tablets (4 mg total) by mouth daily as needed for diarrhea or loose stools.   unk   metoprolol succinate (TOPROL XL) 25 MG 24 hr tablet Take 1 tablet (25 mg total) by mouth daily. 90 tablet 3 08/15/2021 at 0800   midodrine (PROAMATINE) 5 MG tablet Take Monday, Wednesday, Friday morning BEFORE dialysis 48 tablet 6 Past Week   multivitamin (RENA-VIT) TABS tablet Take 1 tablet by mouth daily.   08/15/2021   nitroGLYCERIN (NITROSTAT) 0.4 MG SL tablet Place 1 tablet (0.4 mg total) under the tongue every 5 (five) minutes x 3 doses as needed for chest pain (if no relief after 2nd dose, proceed to the ED for an evaluation or call 911). 25 tablet 3 unk   omeprazole (PRILOSEC) 20 MG capsule Take 1 capsule (20 mg total) by mouth daily. 90 capsule 3 08/15/2021   rosuvastatin (CRESTOR) 10 MG tablet Take 1 tablet (10 mg total) by mouth daily. 90 tablet 3 08/15/2021   sevelamer carbonate (RENVELA)  800 MG tablet Take 3 tablets (2,400 mg total) by mouth 3 (three) times daily with meals.   08/15/2021   Scheduled:   amiodarone  100 mg Oral Daily   aspirin EC  81 mg Oral Daily   Chlorhexidine Gluconate Cloth  6 each Topical Q0600   Chlorhexidine Gluconate Cloth  6 each Topical Q0600   darbepoetin (  ARANESP) injection - DIALYSIS  150 mcg Intravenous Q Mon-HD   levothyroxine  25 mcg Oral Daily   midodrine  10 mg Oral TID WC   nebivolol  2.5 mg Oral Daily   rosuvastatin  10 mg Oral Daily   sevelamer carbonate  2,400 mg Oral TID WC    Assessment: 81yo female c/o CP similar to CP that led to a recent hospital admission, had recurrent CP this am that responded well to O2 and SL NTG >> to start heparin infusion.  Of note on recent admission heparin was stopped d/t drop in Hgb (HIT Ab negative) so will aim for a low goal and adjust rates cautiously.  HL 0.48- therapeutic , no issues with infusion per RN Hgb 8.0- monitor  Goal of Therapy:  Heparin level 0.3-0.5 units/ml Monitor platelets by anticoagulation protocol: Yes   Plan:  Continue heparin infusion at 850 units/hr Will recheck HL daily while on heparin Continue to monitor H&H and platelets  Margot Ables, PharmD Clinical Pharmacist 08/19/2021 9:41 AM

## 2021-08-19 NOTE — Progress Notes (Addendum)
Subjective:  HD yesterday with 1.5L off-- did very well.    Objective Vital signs in last 24 hours: Vitals:   08/19/21 0800 08/19/21 0830 08/19/21 0930 08/19/21 0945  BP: (!) 145/45 (!) 124/54 (!) 137/29 (!) 158/22  Pulse: (!) 55 (!) 55 (!) 52 (!) 53  Resp: 19   16  Temp:      TempSrc:      SpO2: 100% 100%  100%  Weight:      Height:       Weight change: 0 kg  Intake/Output Summary (Last 24 hours) at 08/19/2021 1002 Last data filed at 08/18/2021 1915 Gross per 24 hour  Intake --  Output 1542 ml  Net -1542 ml   Dialysis Orders:  [facility] - MWF-  3.5 hours  EDW 54. HD Bath 2k/2.5 ca, Heparin none. Access LUE AVG BFR 300, DFR 500- 15 guage. Micera 200 mcg IV every 2 weeks   Assessment/Plan: 81 year old F with multiple medical issues and ESRD.  Afib with RVR. 1 CP-  per cards-  just had a cardiac cath-  plan was for medical management-  on heparin gtt currently  2 ESRD: MWF- [facility]-  have been having trouble with UF but tolerated well yesterday.  HD on schedule next 2/10 /23 3 Hypertension/vol: minimal UF goal--> she is on midodrine 10 mg TID-- BP high, will decrease to 5 TID 4. Anemia of ESRD: No overt losses--> on ESA 5. Metabolic Bone Disease: will continue home renvela  6.  Atrial fibrillation- on amiodarone gtt, NSR 7.  Dispo: In ICU for now on gtt   [name]    Labs: Basic Metabolic Panel: Recent Labs  Lab 08/15/21 1205 08/16/21 0511 08/18/21 1617  NA 136 140 135  K 3.3* 3.3* 3.8  CL 98 103 97*  CO2 27 25 26   GLUCOSE 173* 82 95  BUN 42* 53* 52*  CREATININE 5.25* 6.87* 8.11*  CALCIUM 9.3 9.4 9.0  PHOS  --   --  3.9   Liver Function Tests: Recent Labs  Lab 08/18/21 1617  ALBUMIN 3.3*   No results for input(s): LIPASE, AMYLASE in the last 168 hours. No results for input(s): AMMONIA in the last 168 hours. CBC: Recent Labs  Lab 08/15/21 1205 08/16/21 0511 08/17/21 0636 08/18/21 0423 08/19/21 0013  WBC 5.5 4.6 6.6 6.3 6.3  NEUTROABS  4.0  --   --   --   --   HGB 9.7* 8.9* 10.0* 8.9* 8.0*  HCT 31.2* 27.8* 32.1* 28.0* 25.6*  MCV 117.3* 115.4* 115.5* 116.2* 119.1*  PLT PLATELET CLUMPS NOTED ON SMEAR 163 226 PLATELET CLUMPS NOTED ON SMEAR, COUNT APPEARS ADEQUATE 174   Cardiac Enzymes: No results for input(s): CKTOTAL, CKMB, CKMBINDEX, TROPONINI in the last 168 hours. CBG: Recent Labs  Lab 08/17/21 0547 08/17/21 0815 08/19/21 0514  GLUCAP 104* 130* 83    Iron Studies: No results for input(s): IRON, TIBC, TRANSFERRIN, FERRITIN in the last 72 hours. Studies/Results: No results found. Medications: Infusions:  sodium chloride     sodium chloride     heparin 950 Units/hr (08/19/21 0648)    Scheduled Medications:  amiodarone  100 mg Oral Daily   aspirin EC  81 mg Oral Daily   Chlorhexidine Gluconate Cloth  6 each Topical Q0600   Chlorhexidine Gluconate Cloth  6 each Topical Q0600   darbepoetin (ARANESP) injection - DIALYSIS  150 mcg Intravenous Q Mon-HD   levothyroxine  25 mcg Oral Daily  midodrine  10 mg Oral TID WC   nebivolol  2.5 mg Oral Daily   rosuvastatin  10 mg Oral Daily   sevelamer carbonate  2,400 mg Oral TID WC    have reviewed scheduled and prn medications.  Physical Exam: General:  alert, eating breakfast-  nad Heart: tachy-  irreg Lungs: mostly clear Abdomen: soft, non tender Extremities: no edema Dialysis Access: left AVG - patent     08/19/2021,10:02 AM  LOS: 3 days

## 2021-08-19 NOTE — Progress Notes (Signed)
[place] for heparin Indication: chest pain/ACS  Allergies  Allergen Reactions   Amlodipine Swelling   Aspirin Other (See Comments)    High Doses Mess up her stomach; "makes my bowels have blood in them". Takes 81 mg EC Aspirin    Nitrofurantoin Hives   Penicillins Other (See Comments)    SYNCOPE? , "makes me real weak when I take it; like I'll pass out"  Has patient had a PCN reaction causing immediate rash, facial/tongue/throat swelling, SOB or lightheadedness with hypotension: Yes Has patient had a PCN reaction causing severe rash involving mucus membranes or skin necrosis: no Has patient had a PCN reaction that required hospitalization no Has patient had a PCN reaction occurring within the last 10 years: no If all of the above    Bactrim [Sulfamethoxazole-Trimethoprim] Rash   Contrast Media [Iodinated Contrast Media] Itching   Iron Itching and Other (See Comments)    "they gave me iron in dialysis; had to give me Benadryl cause I had to have the iron" (05/02/2012)   Tylenol [Acetaminophen] Itching and Other (See Comments)    Makes her feet on fire per pt   Gabapentin Other (See Comments)    Unknown reaction   Iron Sucrose    Ranexa [Ranolazine] Other (See Comments)    Myoclonus-hospitalized    Sucroferric Oxyhydroxide    Dexilant [Dexlansoprazole] Other (See Comments)    Upset stomach   Hydralazine Itching    Has tolerated while inpatient Has tolerated while inpatient   Levaquin [Levofloxacin In D5w] Rash   Morphine And Related Itching and Other (See Comments)    Itching in feet   Pantoprazole Rash   Plavix [Clopidogrel Bisulfate] Rash   Protonix [Pantoprazole Sodium] Rash   Venofer [Ferric Oxide] Itching and Other (See Comments)    Patient reports using Benadryl prior to doses as [place]    Patient Measurements: Height: 5\' 2"  (157.5 cm) Weight: 54.5 kg (120 lb 2.4 oz) IBW/kg (Calculated) : 50.1 HEPARIN DW (KG):  54.5   Vital Signs: Temp: 98 F (36.7 C) (02/82 2000) Temp Source: Oral (02/82 2000) BP: 154/77 (02/82 2000) Pulse Rate: 55 (02/08 2000)  Labs: Recent Labs    08/16/21 0511 08/16/21 0851 08/16/21 1723 08/17/21 0636 08/17/21 1654 08/18/21 0423 08/18/21 1617 08/19/21 0013  HGB 8.9*  --   --  10.0*  --  8.9*  --   --   HCT 27.8*  --   --  32.1*  --  28.0*  --   --   PLT 163  --   --  226  --  PLATELET CLUMPS NOTED ON SMEAR, COUNT APPEARS ADEQUATE  --   --   HEPARINUNFRC  --   --    < > 0.16*   < > 0.27* 0.46 0.18*  CREATININE 6.87*  --   --   --   --   --  8.11*  --   TROPONINIHS 46* 49*  --   --   --   --   --   --    < > = values in this interval not displayed.     Estimated Creatinine Clearance: 4.3 mL/min (A) (by C-G formula based on SCr of 8.11 mg/dL (H)).   Medical History: Past Medical History:  Diagnosis Date   Acute on chronic respiratory failure with hypoxia ([place]) 10/10/2016   Anxiety    Arthritis    AVM (arteriovenous malformation) of colon    CAD (  coronary artery disease)    a. s/p CABG in 2013 b. DES to D1 in 10/2016. c. cath in 07/2018 showing patent grafts with occlusion of D1 at prior stent site and progression of PDA disease --> medical management recommended   Carotid artery disease ([place])    a. 69-62% LICA, 03/5283    Chronic anemia    Chronic bronchitis (HCC)    Chronic diastolic CHF (congestive heart failure) ([place])    a. 02/2012 Echo EF 60-65%, nl wall motion, Gr 1 DD, mod MR   Colon cancer ([place]) 1992   Esophageal stricture    ESRD on hemodialysis ([place])    ESRD due to HTN, started dialysis 2011 and gets HD at [facility] with Dr Hinda Lenis on MWF schedule.  Access is LUA AVF as of Sept 2014.    GERD (gastroesophageal reflux disease)    High cholesterol 12/2011   History of blood transfusion 07/2011; 12/2011; 01/2012 X 2; 04/2012   History of gout    History of lower GI bleeding    Hypertension    Iron deficiency anemia    Jugular vein occlusion,  right (HCC)    Mitral regurgitation    a. Moderate by echo, 02/2012   Myocardial infarction [facility])    NSVT (nonsustained ventricular tachycardia)    Ovarian cancer ([place]) 1992   PAF (paroxysmal atrial fibrillation) ([place])    Pneumonia ~ 2009   PUD (peptic ulcer disease)    TIA (transient ischemic attack)     Medications:  Medications Prior to Admission  Medication Sig Dispense Refill Last Dose   ALPRAZolam (XANAX) 0.5 MG tablet Take 0.5 mg by mouth at bedtime.   08/15/2021   amiodarone (PACERONE) 200 MG tablet Take 1 tablet (200 mg total) by mouth daily. 90 tablet 2 08/15/2021   aspirin EC 81 MG tablet Take 81 mg by mouth daily.    08/15/2021   hydrOXYzine (VISTARIL) 100 MG capsule Take 100 mg by mouth daily as needed for anxiety.   unk   isosorbide mononitrate (IMDUR) 30 MG 24 hr tablet Take 3 tablets (90 mg total) by mouth 2 (two) times daily. 60 tablet 3 08/15/2021   levothyroxine (SYNTHROID) 25 MCG tablet Take 1 tablet (25 mcg total) by mouth daily. 90 tablet 0 08/15/2021   lidocaine-prilocaine (EMLA) cream Apply 1 application topically every Monday, Wednesday, and Friday. Prior to dialysis   Past Week   loperamide (IMODIUM A-D) 2 MG tablet Take 2 tablets (4 mg total) by mouth daily as needed for diarrhea or loose stools.   unk   metoprolol succinate (TOPROL XL) 25 MG 24 hr tablet Take 1 tablet (25 mg total) by mouth daily. 90 tablet 3 08/15/2021 at 0800   midodrine (PROAMATINE) 5 MG tablet Take Monday, Wednesday, Friday morning BEFORE dialysis 48 tablet 6 Past Week   multivitamin (RENA-VIT) TABS tablet Take 1 tablet by mouth daily.   08/15/2021   nitroGLYCERIN (NITROSTAT) 0.4 MG SL tablet Place 1 tablet (0.4 mg total) under the tongue every 5 (five) minutes x 3 doses as needed for chest pain (if no relief after 2nd dose, proceed to the ED for an evaluation or call 911). 25 tablet 3 unk   omeprazole (PRILOSEC) 20 MG capsule Take 1 capsule (20 mg total) by mouth daily. 90 capsule 3 08/15/2021    rosuvastatin (CRESTOR) 10 MG tablet Take 1 tablet (10 mg total) by mouth daily. 90 tablet 3 08/15/2021   sevelamer carbonate (RENVELA) 800 MG tablet Take 3 tablets (  2,400 mg total) by mouth 3 (three) times daily with meals.   08/15/2021   Scheduled:   aspirin EC  81 mg Oral Daily   Chlorhexidine Gluconate Cloth  6 each Topical Q0600   Chlorhexidine Gluconate Cloth  6 each Topical Q0600   darbepoetin (ARANESP) injection - DIALYSIS  150 mcg Intravenous Q Mon-HD   levothyroxine  25 mcg Oral Daily   metoprolol tartrate  12.5 mg Oral BID   midodrine  10 mg Oral TID WC   rosuvastatin  10 mg Oral Daily   sevelamer carbonate  2,400 mg Oral TID WC    Assessment: 81yo female c/o CP similar to CP that led to a recent hospital admission, had recurrent CP this am that responded well to O2 and SL NTG >> to start heparin infusion.  Of note on recent admission heparin was stopped d/t drop in Hgb (HIT Ab negative) so will aim for a low goal and adjust rates cautiously.  HL 0.46- therapeutic , no issues with infusion per RN  2/9 AM update:  Heparin level low  Goal of Therapy:  Heparin level 0.3-0.5 units/ml Monitor platelets by anticoagulation protocol: Yes   Plan:  Inc heparin infusion to 950 units/hr Will recheck HL in 8 hours and daily while on heparin Continue to monitor H&H and platelets  Narda Bonds, PharmD, [place] Pharmacist Phone: [phone]

## 2021-08-19 NOTE — Progress Notes (Signed)
PROGRESS NOTE   Shawna [name], is a 81 y.o. female, DOB - 1940-11-09, OFH:[id number]  Admit date - 08/15/2021   Admitting Physician Lorence Nagengast Denton Brick, MD  Outpatient Primary MD for the patient is [facility]  LOS - 3  Chief Complaint  Patient presents with   Chest Pain      Brief Narrative:   Per HPI:  Shawna [name] is a 81 y.o. female with medical history significant for ESRD, systolic and diastolic CHF, atrial fibrillation, ESRD, CABG, diabetes mellitus, GI bleed. Patient presented to the ED with complaints of onset of chest pain this morning.   She reports chest pain on the right side and moved to the left.  No radiation, or SOB. Not worse with deep breathing or cough.   She goes for dialysis MWF, but the last few sessions they have not been able to pull off enough fluid due to her low blood pressure.  She reports the midodrine helps a bit.  She had to go again yesterday Saturday but they were able to pull just 1 L.  She feels her abdomen is bloated. She does not know her dry weight.  She does not make any urine.   She reports prior similar chest pains about 3 weeks ago, which she was admitted to the hospital 1/5-1/14 NSTEMI, started on heparin drip initially and was discontinued due to drop in hemoglobin, cardiac cath was done which showed-unchanged anatomy compared to her prior cath in 2020, patent grafts.  It was felt that her chest pain and mild enzyme leak (Troponin elevated 449) was secondary to demand ischemia, and medical therapy was recommended. She required 1 unit PRBC of blood.   ED Course:  Tachycardic HR 112-121.  RR 14- 30.  BP down to 88/54, subsequently improved systolic now 549I. Troponin 27 x 2.  EKG without significant changes from prior.  Potassium 3.3.   Chest x-ray clear. EDP talked to cardiology, recommended d/c Imdur, metoprolol Admitted for Hypotension, Tachycardia and chest pain    -Assessment and Plan: * Chest pain- (present on admission) - In  the setting of tachycardia in a patient with underlying CAD/status post prior CABG -Previously ruled out for ACS by EKG and cardiac enzymes -LHC 07/22/2021 without significant obstructive findings -treated with  IV heparin and nitro -Continue aspirin, bystolic and Crestor -Cardiology consult appreciated -Remains chest pain-free  Paroxysmal Atrial fibrillation/Flutter- (present on admission) - Converted to sinus rhythm on IV amiodarone -Discussed with cardiologist Dr Johnsie Cancel, he recommends transition to oral amiodarone  -Okay to stop IV heparin on 08/19/2021\ CHA2DS2- VASc score   is = 5 (age x 2, HTN, CHF, female)    Which is  equal to =7.2  % annual risk of stroke  -Risk versus benefit of full anticoagulation discussed with patient patient declines full anticoagulation due to concerns for bleeding, patient required blood transfusions from time to time due to chronic anemia of ESRD with superimposed PRESUMED GI bleed from time to time -Patient was unable to have previously scheduled colonoscopy -Continue Bystolic and amiodarone for rate control -No further chest pains palpitations or dizziness at this time  HFpEF/Chronic diastolic CHF (congestive heart failure) ([place])- (present on admission) - stable, compensated -Continue Bystolic Continue to use hemodialysis sessions to address volume status  Hypokalemia- (present on admission) - Addressed with hemodialysis sessions  ESRD (end stage renal disease) on dialysis [facility]) - On hemodialysis Monday Wednesdays and Fridays -Ok to change Midodrine to 5 mg TID on HD days to help  with intrahemodialysis hypotension -Nephrology consult appreciated  Goals of care, counseling/discussion - Palliative care consult appreciated -Request full code and full scope of treatment without limitations -Patient understands that she has many comorbid conditions and overall high risk for decompensation  Essential hypertension- (present on admission) -bystolic mostly  for rate control -Continue midodrine discharge on hemodialysis days for pressure support -Cardiology consult appreciated  Carotid artery disease ([place])- (present on admission) - Currently stable,- -continue aspirin, bystolic and Crestor  Hx of CABG - Chest pain improved with nitro, see chest pain above  Anemia in chronic kidney disease- (present on admission) - Anemia of chronic disease due to CKD, -Hgb stable around 8 -Procrit/ESA agent by nephrology team -Prior H/o transfusions  HLD (hyperlipidemia)- (present on admission) - Continue Crestor   Disposition/Need for in-Hospital Stay- patient unable to be discharged at this time due to possible discharge home on 08/20/2021----after hemodialysis session-if heart rate remains fine off IV amiodarone drip  Status is: Inpatient   Disposition: The patient is from: Home              Anticipated d/c is to: Home              Anticipated d/c date is: 1 day              Patient currently is not medically stable to d/c. Barriers: Not Clinically Stable-   Code Status :  -  Code Status: Full Code   Family Communication:    NA (patient is alert, awake and coherent)   DVT Prophylaxis  :   - SCDs      Lab Results  Component Value Date   PLT 174 08/19/2021    Inpatient Medications  Scheduled Meds:  amiodarone  100 mg Oral Daily   aspirin EC  81 mg Oral Daily   Chlorhexidine Gluconate Cloth  6 each Topical Q0600   Chlorhexidine Gluconate Cloth  6 each Topical Q0600   darbepoetin (ARANESP) injection - DIALYSIS  150 mcg Intravenous Q Mon-HD   levothyroxine  25 mcg Oral Daily   [START ON 08/20/2021] midodrine  5 mg Oral Q M,W,F-HD   nebivolol  2.5 mg Oral Daily   rosuvastatin  10 mg Oral Daily   sevelamer carbonate  2,400 mg Oral TID WC   Continuous Infusions:  sodium chloride     sodium chloride     PRN Meds:.sodium chloride, sodium chloride, lidocaine (PF), lidocaine-prilocaine, nitroGLYCERIN, pentafluoroprop-tetrafluoroeth,  polyethylene glycol   Anti-infectives (From admission, onward)    None       Subjective: Shawna [name] today has no fevers, no emesis,  No further chest pain,   -Rate control much improved -Fatigue appreciated, no dyspnea at rest   Objective: Vitals:   08/19/21 1045 08/19/21 1228 08/19/21 1300 08/19/21 1357  BP: (!) 165/24 (!) 130/52  (!) 162/96  Pulse: (!) 49 (!) 51    Resp: 13 19  15   Temp:      TempSrc:      SpO2: 99% 100% 100% 100%  Weight:      Height:        Intake/Output Summary (Last 24 hours) at 08/19/2021 1645 Last data filed at 08/19/2021 1343 Gross per 24 hour  Intake 544.5 ml  Output 1542 ml  Net -997.5 ml   Filed Weights   08/17/21 0950 08/18/21 1535 08/19/21 0600  Weight: 54.5 kg 54.5 kg 59.3 kg    Physical Exam  Gen:- Awake Alert,  in no apparent distress  HEENT:-  [place].AT, No sclera icterus Neck-Supple Neck,No JVD,.  Lungs-  CTAB , fair symmetrical air movement CV- S1, S2 normal, irregular, no longer irregularly irregular Abd-  +ve B.Sounds, Abd Soft, No tenderness,    Extremity/Skin:- No  edema, pedal pulses present  Psych-affect is appropriate, oriented x3 Neuro-no new focal deficits, no tremors MSK- Lt UE AVF  Data Reviewed: I have personally reviewed following labs and imaging studies  CBC: Recent Labs  Lab 08/15/21 1205 08/16/21 0511 08/17/21 0636 08/18/21 0423 08/19/21 0013  WBC 5.5 4.6 6.6 6.3 6.3  NEUTROABS 4.0  --   --   --   --   HGB 9.7* 8.9* 10.0* 8.9* 8.0*  HCT 31.2* 27.8* 32.1* 28.0* 25.6*  MCV 117.3* 115.4* 115.5* 116.2* 119.1*  PLT PLATELET CLUMPS NOTED ON SMEAR 163 226 PLATELET CLUMPS NOTED ON SMEAR, COUNT APPEARS ADEQUATE 832   Basic Metabolic Panel: Recent Labs  Lab 08/15/21 1205 08/15/21 1401 08/16/21 0511 08/18/21 1617  NA 136  --  140 135  K 3.3*  --  3.3* 3.8  CL 98  --  103 97*  CO2 27  --  25 26  GLUCOSE 173*  --  82 95  BUN 42*  --  53* 52*  CREATININE 5.25*  --  6.87* 8.11*  CALCIUM 9.3  --  9.4 9.0   MG  --  1.9  --   --   PHOS  --   --   --  3.9   GFR: Estimated Creatinine Clearance: 4.3 mL/min (A) (by C-G formula based on SCr of 8.11 mg/dL (H)). Liver Function Tests: Recent Labs  Lab 08/18/21 1617  ALBUMIN 3.3*   Cardiac Enzymes: No results for input(s): CKTOTAL, CKMB, CKMBINDEX, TROPONINI in the last 168 hours. BNP (last 3 results) No results for input(s): PROBNP in the last 8760 hours. HbA1C: No results for input(s): HGBA1C in the last 72 hours. Sepsis Labs: @LABRCNTIP (procalcitonin:4,lacticidven:4) ) Recent Results (from the past 240 hour(s))  Resp Panel by RT-PCR (Flu A&B, Covid) Nasopharyngeal Swab     Status: None   Collection Time: 08/15/21  4:36 PM   Specimen: Nasopharyngeal Swab; Nasopharyngeal(NP) swabs in vial transport medium  Result Value Ref Range Status   SARS Coronavirus 2 by RT PCR NEGATIVE NEGATIVE Final    Comment: (NOTE) SARS-CoV-2 target nucleic acids are NOT DETECTED.  The SARS-CoV-2 RNA is generally detectable in upper respiratory specimens during the acute phase of infection. The lowest concentration of SARS-CoV-2 viral copies this assay can detect is 138 copies/mL. A negative result does not preclude SARS-Cov-2 infection and should not be used as the sole basis for treatment or other patient management decisions. A negative result may occur with  improper specimen collection/handling, submission of specimen other than nasopharyngeal swab, presence of viral mutation(s) within the areas targeted by this assay, and inadequate number of viral copies(<138 copies/mL). A negative result must be combined with clinical observations, patient history, and epidemiological information. The expected result is Negative.  Fact Sheet for Patients:  EntrepreneurPulse.com.au  Fact Sheet for Healthcare Providers:  IncredibleEmployment.be  This test is no t yet approved or cleared by the [place] FDA and  has been  authorized for detection and/or diagnosis of SARS-CoV-2 by FDA under an Emergency Use Authorization (EUA). This EUA will remain  in effect (meaning this test can be used) for the duration of the COVID-19 declaration under Section 564(b)(1) of the Act, 21 U.S.C.section 360bbb-3(b)(1), unless the authorization is terminated  or revoked sooner.  Influenza A by PCR NEGATIVE NEGATIVE Final   Influenza B by PCR NEGATIVE NEGATIVE Final    Comment: (NOTE) The Xpert Xpress SARS-CoV-2/FLU/RSV plus assay is intended as an aid in the diagnosis of influenza from Nasopharyngeal swab specimens and should not be used as a sole basis for treatment. Nasal washings and aspirates are unacceptable for Xpert Xpress SARS-CoV-2/FLU/RSV testing.  Fact Sheet for Patients: EntrepreneurPulse.com.au  Fact Sheet for Healthcare Providers: IncredibleEmployment.be  This test is not yet approved or cleared by the [place] FDA and has been authorized for detection and/or diagnosis of SARS-CoV-2 by FDA under an Emergency Use Authorization (EUA). This EUA will remain in effect (meaning this test can be used) for the duration of the COVID-19 declaration under Section 564(b)(1) of the Act, 21 U.S.C. section 360bbb-3(b)(1), unless the authorization is terminated or revoked.  Performed at [facility], [address], [place], [place] [address]   MRSA Next Gen by PCR, Nasal     Status: None   Collection Time: 08/17/21 10:11 AM   Specimen: Nasal Mucosa; Nasal Swab  Result Value Ref Range Status   MRSA by PCR Next Gen NOT DETECTED NOT DETECTED Final    Comment: (NOTE) The GeneXpert MRSA Assay (FDA approved for NASAL specimens only), is one component of a comprehensive MRSA colonization surveillance program. It is not intended to diagnose MRSA infection nor to guide or monitor treatment for MRSA infections. Test performance is not FDA approved in patients less than 76  years old. Performed at [facility], [address] [place], [place] [address]       Radiology Studies: No results found.   Scheduled Meds:  amiodarone  100 mg Oral Daily   aspirin EC  81 mg Oral Daily   Chlorhexidine Gluconate Cloth  6 each Topical Q0600   Chlorhexidine Gluconate Cloth  6 each Topical Q0600   darbepoetin (ARANESP) injection - DIALYSIS  150 mcg Intravenous Q Mon-HD   levothyroxine  25 mcg Oral Daily   [START ON 08/20/2021] midodrine  5 mg Oral Q M,W,F-HD   nebivolol  2.5 mg Oral Daily   rosuvastatin  10 mg Oral Daily   sevelamer carbonate  2,400 mg Oral TID WC   Continuous Infusions:  sodium chloride     sodium chloride       LOS: 3 days    Roxan Hockey M.D on 08/19/2021 at 4:45 PM  Go to [email or web address] - for contact info  [facility] - Office  [phone]  If 7PM-7AM, please contact night-coverage [email or web address] Password [facility] 08/19/2021, 4:45 PM

## 2021-08-19 NOTE — Progress Notes (Signed)
Progress Note  Patient Name: VERNETTA DIZDAREVIC Date of Encounter: 08/19/2021  [facility] Cardiologist: Carlyle Dolly, MD    Subjective   Cheerful Maintaining NSR    Inpatient Medications    Scheduled Meds:  amiodarone  100 mg Oral Daily   aspirin EC  81 mg Oral Daily   Chlorhexidine Gluconate Cloth  6 each Topical Q0600   Chlorhexidine Gluconate Cloth  6 each Topical Q0600   darbepoetin (ARANESP) injection - DIALYSIS  150 mcg Intravenous Q Mon-HD   levothyroxine  25 mcg Oral Daily   midodrine  10 mg Oral TID WC   nebivolol  2.5 mg Oral Daily   rosuvastatin  10 mg Oral Daily   sevelamer carbonate  2,400 mg Oral TID WC   Continuous Infusions:  sodium chloride     sodium chloride     heparin 950 Units/hr (08/19/21 0648)   PRN Meds: sodium chloride, sodium chloride, lidocaine (PF), lidocaine-prilocaine, nitroGLYCERIN, pentafluoroprop-tetrafluoroeth, polyethylene glycol   Vital Signs    Vitals:   08/19/21 0500 08/19/21 0600 08/19/21 0615 08/19/21 0630  BP: (!) 142/70  140/73 (!) 160/27  Pulse: (!) 48  (!) 50 (!) 51  Resp: 14  19 17   Temp:  97.8 F (36.6 C)    TempSrc:  Oral    SpO2: 100%  100% 100%  Weight:  59.3 kg    Height:        Intake/Output Summary (Last 24 hours) at 08/19/2021 0816 Last data filed at 08/18/2021 1915 Gross per 24 hour  Intake --  Output 1542 ml  Net -1542 ml   Last 3 Weights 08/19/2021 08/18/2021 08/17/2021  Weight (lbs) 130 lb 11.7 oz 120 lb 2.4 oz 120 lb 2.4 oz  Weight (kg) 59.3 kg 54.5 kg 54.5 kg      Telemetry    SB rates 50's   ECG       Physical Exam    GEN: No acute distress.   Neck: No JVD Cardiac: RRR 130/m Respiratory: Clear to auscultation bilaterally. GI: Soft, nontender, non-distended  MS: No edema; No deformity. Neuro:  Nonfocal  Psych: Normal affect   Labs    High Sensitivity Troponin:   Recent Labs  Lab 08/15/21 1205 08/15/21 1401 08/16/21 0511 08/16/21 0851  TROPONINIHS 27* 27* 46* 49*      Chemistry Recent Labs  Lab 08/15/21 1205 08/15/21 1401 08/16/21 0511 08/18/21 1617  NA 136  --  140 135  K 3.3*  --  3.3* 3.8  CL 98  --  103 97*  CO2 27  --  25 26  GLUCOSE 173*  --  82 95  BUN 42*  --  53* 52*  CREATININE 5.25*  --  6.87* 8.11*  CALCIUM 9.3  --  9.4 9.0  MG  --  1.9  --   --   ALBUMIN  --   --   --  3.3*  GFRNONAA 8*  --  6* 5*  ANIONGAP 11  --  12 12    Lipids No results for input(s): CHOL, TRIG, HDL, LABVLDL, LDLCALC, CHOLHDL in the last 168 hours.  Hematology Recent Labs  Lab 08/17/21 0636 08/18/21 0423 08/19/21 0013  WBC 6.6 6.3 6.3  RBC 2.78* 2.41* 2.15*  HGB 10.0* 8.9* 8.0*  HCT 32.1* 28.0* 25.6*  MCV 115.5* 116.2* 119.1*  MCH 36.0* 36.9* 37.2*  MCHC 31.2 31.8 31.3  RDW 20.1* 19.9* 20.1*  PLT 226 PLATELET CLUMPS NOTED ON SMEAR, COUNT APPEARS ADEQUATE  174   Thyroid No results for input(s): TSH, FREET4 in the last 168 hours.  BNP Recent Labs  Lab 08/15/21 2040  BNP 1,051.0*    DDimer No results for input(s): DDIMER in the last 168 hours.   Radiology    No results found.  Cardiac Studies      Echocardiogram: 07/2021 IMPRESSIONS     1. Left ventricular ejection fraction, by estimation, is 30 to 35%. The  left ventricle has moderately decreased function. The left ventricle  demonstrates regional wall motion abnormalities (see scoring  diagram/findings for description). There is mild  left ventricular hypertrophy. Left ventricular diastolic parameters are  consistent with Grade II diastolic dysfunction (pseudonormalization).  Elevated left atrial pressure.   2. Right ventricular systolic function is moderately reduced. The right  ventricular size is normal. There is mildly elevated pulmonary artery  systolic pressure. The estimated right ventricular systolic pressure is  03.5 mmHg.   3. Left atrial size was severely dilated.   4. Right atrial size was mildly dilated.   5. The mitral valve is degenerative. Moderate mitral valve  regurgitation.  Mild to moderate mitral stenosis. MG 73mmHg at 70bpm, MVA 1.2 cm^2 by  continuity equation. Moderate mitral annular calcification.   6. Tricuspid valve regurgitation is mild to moderate.   7. The aortic valve is tricuspid. Aortic valve regurgitation is not  visualized. Aortic valve sclerosis/calcification is present, without any  evidence of aortic stenosis.   8. The inferior vena cava is normal in size with greater than 50%  respiratory variability, suggesting right atrial pressure of 3 mmHg.      LHC: 07/2021 Mid LM to Dist LM lesion is 60% stenosed.   Prox LAD lesion is 90% stenosed.   Ost Cx to Mid Cx lesion is 99% stenosed.   Ost RCA to Prox RCA lesion is 100% stenosed.   Ost Ramus to Ramus lesion is 80% stenosed.   Ost 1st Diag to 1st Diag lesion is 100% stenosed.   Ost RPDA to RPDA lesion is 75% stenosed.   2nd Mrg lesion is 60% stenosed.   Dist LAD lesion is 70% stenosed.   LIMA and is large.   SVG and is large.   SVG and is normal in caliber.   SVG and is large.   The graft exhibits no disease.   The graft exhibits no disease.   The graft exhibits no disease.   LV end diastolic pressure is mildly elevated.   The left ventricular ejection fraction is 35-45% by visual estimate.    IMPRESSION: Ms. Nusz anatomy is for all intents and purposes unchanged from her prior cath performed by Dr. [place] in 2020.  All her grafts are patent.  Her LVEDP was 18.  Her EF looks to be in the 40 to 45% range with an anteroapical wall motion abnormality.  I suspect that her chest pain and mild enzyme leak was related to "demand ischemia".  Medical therapy will be recommended.  If femoral angiogram was performed and the stick appeared to be slightly high and therefore Mynx closure was not performed.  The patient will need a manual hold in the recovery bay prior to going to the floor.  Dr. Domenic Polite, the patient's attending cardiologist, was notified of these results.  Patient  Profile     81 y.o. female with history of CAD status post CABG (distal small vessel disease), systolic heart failure, ESRD on hemodialysis, recurrent GI bleed, chronic anemia who was admitted on 08/15/2021 with  recurrent atrial flutter and chest pain symptoms.  Course is complicated by hypotension not able to tolerate hemodialysis.    Assessment & Plan     Aflutter with RVR-converted to NSR 08/18/21 SSS change to oral amiodarone 100 mg daily D/c Lopressor with bradycardia use sympathomimetic beta blocker low dose bystolic   Chest pain in the setting of Aflutter with RVR, troponins flat, cath last month medical therapy.  s/p CABG in 2013, DES to D1 in 10/2016 and cath in 07/2018 showing patent grafts with occlusion of D1 at prior stent site and progression of PDA disease with medical management recommended. Repeat catheterization 07/2021 showed patent grafts with known PDA disease and overall similar to her prior cath in 07/2018. Currently pain free.  Hypotension-not tolerating dialysis, should be better in NSR continue midodrine BP elevated this am    Anemia - She did require a transfusion during her recent admission ut says Hb drop again this am 25.6 per primary service     HLD - She was transitioned from Simvastatin to Crestor during her admission and I have asked her to make sure she is taking this. LDL was at 48 on 07/16/2021.     ESRD - On HD - MWF schedule.    For questions or updates, please contact [place] Please consult [email or web address] for contact info under      Signed, Jenkins Rouge, MD  08/19/2021, 8:16 AM

## 2021-08-20 DIAGNOSIS — Z7189 Other specified counseling: Secondary | ICD-10-CM | POA: Diagnosis not present

## 2021-08-20 DIAGNOSIS — I4891 Unspecified atrial fibrillation: Secondary | ICD-10-CM | POA: Diagnosis not present

## 2021-08-20 DIAGNOSIS — I208 Other forms of angina pectoris: Secondary | ICD-10-CM | POA: Diagnosis not present

## 2021-08-20 DIAGNOSIS — N186 End stage renal disease: Secondary | ICD-10-CM | POA: Diagnosis not present

## 2021-08-20 DIAGNOSIS — R079 Chest pain, unspecified: Secondary | ICD-10-CM | POA: Diagnosis not present

## 2021-08-20 LAB — CBC
HCT: 30.1 % — ABNORMAL LOW (ref 36.0–46.0)
Hemoglobin: 9.1 g/dL — ABNORMAL LOW (ref 12.0–15.0)
MCH: 35.5 pg — ABNORMAL HIGH (ref 26.0–34.0)
MCHC: 30.2 g/dL (ref 30.0–36.0)
MCV: 117.6 fL — ABNORMAL HIGH (ref 80.0–100.0)
Platelets: 172 10*3/uL (ref 150–400)
RBC: 2.56 MIL/uL — ABNORMAL LOW (ref 3.87–5.11)
RDW: 19.6 % — ABNORMAL HIGH (ref 11.5–15.5)
WBC: 6.2 10*3/uL (ref 4.0–10.5)
nRBC: 0 % (ref 0.0–0.2)

## 2021-08-20 LAB — GLUCOSE, CAPILLARY: Glucose-Capillary: 92 mg/dL (ref 70–99)

## 2021-08-20 MED ORDER — MIDODRINE HCL 5 MG PO TABS
ORAL_TABLET | ORAL | Status: AC
  Filled 2021-08-20: qty 1

## 2021-08-20 MED ORDER — AMIODARONE HCL 100 MG PO TABS: 100.0000 mg | ORAL_TABLET | Freq: Every day | ORAL | 2 refills | Status: DC

## 2021-08-20 MED ORDER — METOPROLOL SUCCINATE ER 25 MG PO TB24: 25.0000 mg | ORAL_TABLET | Freq: Every day | ORAL | 3 refills | Status: DC

## 2021-08-20 MED ORDER — MIDODRINE HCL 5 MG PO TABS: 5.0000 mg | ORAL_TABLET | ORAL | 6 refills | Status: DC

## 2021-08-20 MED ORDER — ASPIRIN EC 81 MG PO TBEC: 81.0000 mg | DELAYED_RELEASE_TABLET | Freq: Every day | ORAL | 4 refills | Status: DC

## 2021-08-20 MED ORDER — SEVELAMER CARBONATE 800 MG PO TABS: 2400.0000 mg | ORAL_TABLET | Freq: Three times a day (TID) | ORAL | 4 refills | Status: DC

## 2021-08-20 NOTE — Discharge Summary (Signed)
Shawna [name], is a 81 y.o. female  DOB 1940-09-17  MRN [id number].  Admission date:  08/15/2021  Admitting Physician  Roxan Hockey, MD  Discharge Date:  08/20/2021   Primary MD  Practice, [facility]  Recommendations for primary care physician for things to follow:   1)Very low-salt diet advised--less than 2 g of sodium per day 2)Weigh yourself daily, call if you gain more than 3 pounds in 1 day or more than 5 pounds in 1 week as your hemodialysis regimen/schedule and dry weight may need to be adjusted 3)Limit your Fluid  intake to no more than 60 ounces (1.8 Liters) per day 4)Please note that there has been several changes to your medications--- 5) your amiodarone has been decreased to 100 mg daily 6) please continue outpatient hemodialysis on Mondays Wednesdays and Fridays 7)You need a repeat CBC and BMP blood test on Monday, 08/23/2021  Admission Diagnosis  Tachycardia [R00.0] Chest pain [R07.9] Hypotension, unspecified hypotension type [I95.9] Chest pain, unspecified type [R07.9]   Discharge Diagnosis  Tachycardia [R00.0] Chest pain [R07.9] Hypotension, unspecified hypotension type [I95.9] Chest pain, unspecified type [R07.9]    Principal Problem:   Chest pain Active Problems:   HFpEF/Chronic diastolic CHF (congestive heart failure) (HCC)   Paroxysmal Atrial fibrillation/Flutter   HLD (hyperlipidemia)   Anemia in chronic kidney disease   Hx of CABG   Carotid artery disease (HCC)   Essential hypertension   Goals of care, counseling/discussion   ESRD (end stage renal disease) on dialysis ([place])   Hypokalemia      Past Medical History:  Diagnosis Date   Acute on chronic respiratory failure with hypoxia ([place]) 10/10/2016   Anxiety    Arthritis    AVM (arteriovenous malformation) of colon    CAD (coronary artery disease)    a. s/p CABG in 2013 b. DES to D1 in 10/2016. c. cath in  07/2018 showing patent grafts with occlusion of D1 at prior stent site and progression of PDA disease --> medical management recommended   Carotid artery disease ([place])    a. 41-96% LICA, 08/2295    Chronic anemia    Chronic bronchitis (HCC)    Chronic diastolic CHF (congestive heart failure) ([place])    a. 02/2012 Echo EF 60-65%, nl wall motion, Gr 1 DD, mod MR   Colon cancer ([place]) 1992   Esophageal stricture    ESRD on hemodialysis ([place])    ESRD due to HTN, started dialysis 2011 and gets HD at [facility] with Dr Hinda Lenis on MWF schedule.  Access is LUA AVF as of Sept 2014.    GERD (gastroesophageal reflux disease)    High cholesterol 12/2011   History of blood transfusion 07/2011; 12/2011; 01/2012 X 2; 04/2012   History of gout    History of lower GI bleeding    Hypertension    Iron deficiency anemia    Jugular vein occlusion, right (HCC)    Mitral regurgitation    a. Moderate by echo, 02/2012   Myocardial infarction ([place])  NSVT (nonsustained ventricular tachycardia)    Ovarian cancer ([place]) 1992   PAF (paroxysmal atrial fibrillation) ([place])    Pneumonia ~ 2009   PUD (peptic ulcer disease)    TIA (transient ischemic attack)     Past Surgical History:  Procedure Laterality Date   A/V SHUNTOGRAM Left 03/19/2019   Procedure: A/V SHUNTOGRAM;  Surgeon: Katha Cabal, MD;  Location: [place] [facility];  Service: Cardiovascular;  Laterality: Left;   ABDOMINAL HYSTERECTOMY  1992   APPENDECTOMY  06/1990   AV FISTULA PLACEMENT  07/2009   left upper arm   AV FISTULA PLACEMENT Right 09/06/2016   Procedure: RIGHT FOREARM ARTERIOVENOUS (AV) GRAFT;  Surgeon: Elam Dutch, MD;  Location: [place];  Service: Vascular;  Laterality: Right;   AV FISTULA PLACEMENT N/A 02/24/2017   Procedure: INSERTION OF ARTERIOVENOUS (AV) GORE-TEX GRAFT ARM (BRACHIAL AXILLARY);  Surgeon: Katha Cabal, MD;  Location: [facility];  Service: Vascular;  Laterality: N/A;   [place] Right 09/06/2016   Procedure:  REMOVAL OF Right Arm ARTERIOVENOUS GORETEX GRAFT and Vein Patch angioplasty of brachial artery;  Surgeon: Angelia Mould, MD;  Location: [place];  Service: Vascular;  Laterality: Right;   BIOPSY  09/26/2019   Procedure: BIOPSY;  Surgeon: Rogene Houston, MD;  [facility];  Service: Endoscopy;;   COLON RESECTION  1992   COLON SURGERY     COLONOSCOPY N/A 03/09/2019   Procedure: COLONOSCOPY;  Surgeon: Rogene Houston, MD;  [facility];  Service: Endoscopy;  Laterality: N/A;   COLONOSCOPY WITH PROPOFOL N/A 06/08/2021   Procedure: COLONOSCOPY WITH PROPOFOL;  Surgeon: Harvel Quale, MD;  [facility];  Service: Gastroenterology;  Laterality: N/A;  9:05 /Patient is on dialysis Mon Wed Fri   CORONARY ANGIOPLASTY WITH STENT PLACEMENT  12/15/11   "2"   CORONARY ANGIOPLASTY WITH STENT PLACEMENT  y/2013   "1; makes total of 3" (05/02/2012)   CORONARY ARTERY BYPASS GRAFT  06/13/2012   Procedure: CORONARY ARTERY BYPASS GRAFTING (CABG);  Surgeon: Grace Isaac, MD;  Location: [place];  Service: Open Heart Surgery;  Laterality: N/A;  cabg x four;  using left internal mammary artery, and left leg greater saphenous vein harvested endoscopically   CORONARY STENT INTERVENTION N/A 10/13/2016   Procedure: Coronary Stent Intervention;  Surgeon: Troy Sine, MD;  Location: [place] [facility];  Service: Cardiovascular;  Laterality: N/A;   DIALYSIS/PERMA CATHETER REMOVAL N/A 04/18/2017   Procedure: DIALYSIS/PERMA CATHETER REMOVAL;  Surgeon: Katha Cabal, MD;  Location: [place] [facility];  Service: Cardiovascular;  Laterality: N/A;   DILATION AND CURETTAGE OF UTERUS     ENTEROSCOPY N/A 06/08/2021   Procedure: PUSH ENTEROSCOPY;  Surgeon: Harvel Quale, MD;  [facility];  Service: Gastroenterology;  Laterality: N/A;   ESOPHAGOGASTRODUODENOSCOPY  01/20/2012   Procedure: ESOPHAGOGASTRODUODENOSCOPY (EGD);  Surgeon: Ladene Artist, MD,FACG;   Location: [facility];  Service: Endoscopy;  Laterality: N/A;   ESOPHAGOGASTRODUODENOSCOPY N/A 03/26/2013   Procedure: ESOPHAGOGASTRODUODENOSCOPY (EGD);  Surgeon: Irene Shipper, MD;  Location: [facility] ENDOSCOPY;  Service: Endoscopy;  Laterality: N/A;   ESOPHAGOGASTRODUODENOSCOPY N/A 04/30/2015   Procedure: ESOPHAGOGASTRODUODENOSCOPY (EGD);  Surgeon: Rogene Houston, MD;  [facility];  Service: Endoscopy;  Laterality: N/A;  1pm - moved to 10/20 @ 1:10   ESOPHAGOGASTRODUODENOSCOPY N/A 07/29/2016   Procedure: ESOPHAGOGASTRODUODENOSCOPY (EGD);  Surgeon: Manus Gunning, MD;  Location: [place];  Service: Gastroenterology;  Laterality: N/A;  enteroscopy   ESOPHAGOGASTRODUODENOSCOPY N/A 09/26/2019   Procedure: ESOPHAGOGASTRODUODENOSCOPY (EGD);  Surgeon: Rogene Houston, MD;  [facility];  Service: Endoscopy;  Laterality: N/A;  1250   ESOPHAGOGASTRODUODENOSCOPY (EGD) WITH PROPOFOL N/A 02/05/2021   Procedure: ESOPHAGOGASTRODUODENOSCOPY (EGD) WITH PROPOFOL;  Surgeon: Eloise Harman, DO;  [facility];  Service: Endoscopy;  Laterality: N/A;   GIVENS CAPSULE STUDY N/A 03/07/2019   Procedure: GIVENS CAPSULE STUDY;  Surgeon: Rogene Houston, MD;  [facility];  Service: Endoscopy;  Laterality: N/A;  7:30   GIVENS CAPSULE STUDY N/A 04/22/2021   Procedure: GIVENS CAPSULE STUDY;  Surgeon: Rogene Houston, MD;  [facility];  Service: Endoscopy;  Laterality: N/A;  7:30   INSERTION OF DIALYSIS CATHETER N/A 10/05/2020   Procedure: ABORTED TUNNELED DIALYSIS CATHETER PLACEMENT RIGHT INTERNAL JUGULAR VEIN ;  Surgeon: Virl Cagey, MD;  [facility];  Service: General;  Laterality: N/A;   INTRAOPERATIVE TRANSESOPHAGEAL ECHOCARDIOGRAM  06/13/2012   Procedure: INTRAOPERATIVE TRANSESOPHAGEAL ECHOCARDIOGRAM;  Surgeon: Grace Isaac, MD;  Location: [place];  Service: Open Heart Surgery;  Laterality: N/A;   IR DIALY SHUNT INTRO NEEDLE/INTRACATH INITIAL  W/IMG LEFT Left 10/06/2020   IR FLUORO GUIDE CV LINE RIGHT  06/17/2020   IR GENERIC HISTORICAL  07/26/2016   IR FLUORO GUIDE CV LINE RIGHT 07/26/2016 Greggory Keen, MD [facility]   IR GENERIC HISTORICAL  07/26/2016   IR US GUIDE VASC ACCESS RIGHT 07/26/2016 Greggory Keen, MD [facility]   IR GENERIC HISTORICAL  08/02/2016   IR US GUIDE VASC ACCESS RIGHT 08/02/2016 Greggory Keen, MD [facility]   IR GENERIC HISTORICAL  08/02/2016   IR FLUORO GUIDE CV LINE RIGHT 08/02/2016 Greggory Keen, MD [facility]   IR RADIOLOGY PERIPHERAL GUIDED IV START  03/28/2017   IR REMOVAL TUN CV CATH W/O FL  08/11/2020   IR THROMBECTOMY AV FISTULA W/THROMBOLYSIS INC/SHUNT/IMG LEFT Left 06/17/2020   IR US GUIDE VASC ACCESS LEFT  06/17/2020   IR US GUIDE VASC ACCESS RIGHT  03/28/2017   IR US GUIDE VASC ACCESS RIGHT  06/17/2020   LEFT HEART CATH AND CORONARY ANGIOGRAPHY N/A 09/20/2016   Procedure: Left Heart Cath and Coronary Angiography;  Surgeon: [name], MD;  Location: [place] [facility];  Service: Cardiovascular;  Laterality: N/A;   LEFT HEART CATH AND CORS/GRAFTS ANGIOGRAPHY N/A 10/13/2016   Procedure: Left Heart Cath and Cors/Grafts Angiography;  Surgeon: Troy Sine, MD;  Location: [place] [facility];  Service: Cardiovascular;  Laterality: N/A;   LEFT HEART CATH AND CORS/GRAFTS ANGIOGRAPHY N/A 07/13/2018   Procedure: LEFT HEART CATH AND CORS/GRAFTS ANGIOGRAPHY;  Surgeon: Martinique, Peter M, MD;  Location: [place] [facility];  Service: Cardiovascular;  Laterality: N/A;   LEFT HEART CATH AND CORS/GRAFTS ANGIOGRAPHY N/A 07/22/2021   Procedure: LEFT HEART CATH AND CORS/GRAFTS ANGIOGRAPHY;  Surgeon: Lorretta Harp, MD;  Location: [place] [facility];  Service: Cardiovascular;  Laterality: N/A;   LEFT HEART CATHETERIZATION WITH CORONARY ANGIOGRAM N/A 12/15/2011   Procedure: LEFT HEART CATHETERIZATION WITH CORONARY ANGIOGRAM;  Surgeon: Burnell Blanks, MD;  Location: [facility] CATH LAB;  Service: Cardiovascular;  Laterality:  N/A;   LEFT HEART CATHETERIZATION WITH CORONARY ANGIOGRAM N/A 01/10/2012   Procedure: LEFT HEART CATHETERIZATION WITH CORONARY ANGIOGRAM;  Surgeon: Peter M Martinique, MD;  Location: [facility] CATH LAB;  Service: Cardiovascular;  Laterality: N/A;   LEFT HEART CATHETERIZATION WITH CORONARY ANGIOGRAM N/A 06/08/2012   Procedure: LEFT HEART CATHETERIZATION  WITH CORONARY ANGIOGRAM;  Surgeon: Burnell Blanks, MD;  Location: [facility] CATH LAB;  Service: Cardiovascular;  Laterality: N/A;   LEFT HEART CATHETERIZATION WITH CORONARY/GRAFT ANGIOGRAM N/A 12/10/2013   Procedure: LEFT HEART CATHETERIZATION WITH Beatrix Fetters;  Surgeon: [name], MD;  Location: [facility] CATH LAB;  Service: Cardiovascular;  Laterality: N/A;   OVARY SURGERY     ovarian cancer   POLYPECTOMY  03/09/2019   Procedure: POLYPECTOMY;  Surgeon: Rogene Houston, MD;  [facility];  Service: Endoscopy;;  cecal    POLYPECTOMY N/A 09/26/2019   Procedure: DUODENAL POLYPECTOMY;  Surgeon: Rogene Houston, MD;  [facility];  Service: Endoscopy;  Laterality: N/A;   REVISION OF ARTERIOVENOUS GORETEX GRAFT N/A 02/24/2017   Procedure: REVISION OF ARTERIOVENOUS GORETEX GRAFT (RESECTION);  Surgeon: Katha Cabal, MD;  Location: [facility];  Service: Vascular;  Laterality: N/A;   REVISON OF ARTERIOVENOUS FISTULA Left 06/19/2020   Procedure: REVISION OF LEFT UPPER ARM AV GRAFT WITH INTERPOSITION JUMP GRAFT USING 6MM GORE LIMB;  Surgeon: Marty Heck, MD;  Location: [place];  Service: Vascular;  Laterality: Left;   SHUNTOGRAM N/A 10/15/2013   Procedure: Fistulogram;  Surgeon: Serafina Mitchell, MD;  Location: [facility] CATH LAB;  Service: Cardiovascular;  Laterality: N/A;   THROMBECTOMY / ARTERIOVENOUS GRAFT REVISION  2011   left upper arm   TUBAL LIGATION  1980's   UPPER EXTREMITY ANGIOGRAPHY Bilateral 12/06/2016   Procedure: Upper Extremity Angiography;  Surgeon: Katha Cabal, MD;  Location: [place] [facility];  Service:  Cardiovascular;  Laterality: Bilateral;   UPPER EXTREMITY INTERVENTION Left 06/06/2017   Procedure: UPPER EXTREMITY INTERVENTION;  Surgeon: Katha Cabal, MD;  Location: [place] [facility];  Service: Cardiovascular;  Laterality: Left;       HPI  from the history and physical done on the day of admission:   Chief Complaint: Chest pain   HPI: Shawna [name] is a 81 y.o. female with medical history significant for ESRD, systolic and diastolic CHF, atrial fibrillation, ESRD, CABG, diabetes mellitus, GI bleed. Patient presented to the ED with complaints of onset of chest pain this morning.  She was dressing him get ready to go to church when chest pain started.  She reports chest pain on the right side and moved to the left.  No radiation down her arms or her neck.  No associated difficulty breathing.  Not worse with deep breathing or cough.   She goes for dialysis MWF, but the last few sessions they have not been able to pull off enough fluid due to her low blood pressure.  She reports the midodrine helps a bit.  She had to go again yesterday Saturday but they were able to pull just 1 L.  She feels her abdomen is bloated.  She denies difficulty breathing, no leg swelling.  She does not know her dry weight.  She does not make any urine.   She reports prior similar chest pains about 3 weeks ago, which she was admitted to the hospital 1/5-1/14 NSTEMI, started on heparin drip initially and was discontinued due to drop in hemoglobin, cardiac cath was done which showed-unchanged anatomy compared to her prior cath in 2020, patent grafts.  It was felt that her chest pain and mild enzyme leak (Troponin elevated 449) was secondary to demand ischemia, and medical therapy was recommended. She required 1 unit PRBC of blood.   ED Course: Tachycardic heart rate persistently 1 12-121.  Respiratory  rate 14- 30.  Blood pressure systolic down to 78/24, subsequently improved systolic now 235T. Troponin 27 x 2.  EKG  without significant changes from prior.  Potassium 3.3.   Chest x-ray clear. EDP talked to cardiology, recommended d/c Imdur, metoprolol Hospitalist to admit for hypotension, tachycardia and chest pain.   Review of Systems: As per HPI all other systems reviewed and negative.     Hospital Course:    A/p Chest pain- (present on admission) - In the setting of tachycardia in a patient with underlying CAD/status post prior CABG -Previously ruled out for ACS by EKG and cardiac enzymes -LHC 07/22/2021 without significant obstructive findings -treated with  IV heparin and nitro -Continue aspirin, bystolic and Crestor -Cardiology consult appreciated -Remains chest pain-free   Paroxysmal Atrial fibrillation/Flutter- (present on admission) - Converted to sinus rhythm on IV amiodarone -Discussed with cardiologist Dr Johnsie Cancel, he recommends transition to oral amiodarone  -Okay to stop IV heparin on 08/19/2021\ CHA2DS2- VASc score   is = 5 (age x 2, HTN, CHF, female)    Which is  equal to =7.2  % annual risk of stroke  -Risk versus benefit of full anticoagulation discussed with patient patient declines full anticoagulation due to concerns for bleeding, patient required blood transfusions from time to time due to chronic anemia of ESRD with superimposed PRESUMED GI bleed from time to time -Patient was unable to have previously scheduled colonoscopy -Continue Bystolic and amiodarone at low dose for rate control -No further chest pains palpitations or dizziness at this time   HFpEF/Chronic diastolic CHF (congestive heart failure) ([place])- (present on admission) - stable, compensated -Continue Bystolic Continue to use hemodialysis sessions to address volume status   Hypokalemia- (present on admission) - Addressed with hemodialysis sessions   ESRD (end stage renal disease) on dialysis [facility] - On hemodialysis Monday Wednesdays and Fridays -Ok to change Midodrine to 5 mg TID on HD days to help with  intrahemodialysis hypotension -Nephrology consult appreciated   Goals of care, counseling/discussion - Palliative care consult appreciated -Request full code and full scope of treatment without limitations -Patient understands that she has many comorbid conditions and overall high risk for decompensation   Essential hypertension- (present on admission) -bystolic mostly for rate control -Continue midodrine discharge on hemodialysis days for pressure support -Cardiology consult appreciated   Carotid artery disease ([place])- (present on admission) - Currently stable,- -continue aspirin, bystolic and Crestor   Hx of CABG - Chest pain improved with nitro, see # 1 above   Anemia in chronic kidney disease- (present on admission) - Anemia of chronic disease due to CKD, -Hgb stable around 8 -Procrit/ESA agent by nephrology team -Prior H/o transfusions   HLD (hyperlipidemia)- (present on admission) - Continue Crestor     Disposition--Home     Disposition: The patient is from: Home              Anticipated d/c is to: Home   Discharge Condition: Stable, chest pain-free  Follow UP--- nephrology and cardiology  Diet and Activity recommendation:  As advised  Discharge Instructions    Discharge Instructions     Call MD for:  difficulty breathing, headache or visual disturbances   Complete by: As directed    Call MD for:  persistant dizziness or light-headedness   Complete by: As directed    Call MD for:  persistant nausea and vomiting   Complete by: As directed    Call MD for:  temperature >100.4   Complete by: As directed  Diet - low sodium heart healthy   Complete by: As directed    Discharge instructions   Complete by: As directed    1)Very low-salt diet advised--less than 2 g of sodium per day 2)Weigh yourself daily, call if you gain more than 3 pounds in 1 day or more than 5 pounds in 1 week as your hemodialysis regimen/schedule and dry weight may need to be  adjusted 3)Limit your Fluid  intake to no more than 60 ounces (1.8 Liters) per day 4)Please note that there has been several changes to your medications--- 5) your amiodarone has been decreased to 100 mg daily 6) please continue outpatient hemodialysis on Mondays Wednesdays and Fridays 7)You need a repeat CBC and BMP blood test on Monday, 08/23/2021   Increase activity slowly   Complete by: As directed          Discharge Medications     Allergies as of 08/20/2021       Reactions   Amlodipine Swelling   Aspirin Other (See Comments)   High Doses Mess up her stomach; "makes my bowels have blood in them". Takes 81 mg EC Aspirin    Nitrofurantoin Hives   Penicillins Other (See Comments)   SYNCOPE? , "makes me real weak when I take it; like I'll pass out" Has patient had a PCN reaction causing immediate rash, facial/tongue/throat swelling, SOB or lightheadedness with hypotension: Yes Has patient had a PCN reaction causing severe rash involving mucus membranes or skin necrosis: no Has patient had a PCN reaction that required hospitalization no Has patient had a PCN reaction occurring within the last 10 years: no If all of the above   Bactrim [sulfamethoxazole-trimethoprim] Rash   Contrast Media [iodinated Contrast Media] Itching   Iron Itching, Other (See Comments)   "they gave me iron in dialysis; had to give me Benadryl cause I had to have the iron" (05/02/2012)   Tylenol [acetaminophen] Itching, Other (See Comments)   Makes her feet on fire per pt   Gabapentin Other (See Comments)   Unknown reaction   Iron Sucrose    Ranexa [ranolazine] Other (See Comments)   Myoclonus-hospitalized    Sucroferric Oxyhydroxide    Dexilant [dexlansoprazole] Other (See Comments)   Upset stomach   Hydralazine Itching   Has tolerated while inpatient Has tolerated while inpatient   Levaquin [levofloxacin In D5w] Rash   Morphine And Related Itching, Other (See Comments)   Itching in feet    Pantoprazole Rash   Plavix [clopidogrel Bisulfate] Rash   Protonix [pantoprazole Sodium] Rash   Venofer [ferric Oxide] Itching, Other (See Comments)   Patient reports using Benadryl prior to doses as [place]        Medication List     STOP taking these medications    isosorbide mononitrate 30 MG 24 hr tablet Commonly known as: IMDUR       TAKE these medications    ALPRAZolam 0.5 MG tablet Commonly known as: XANAX Take 0.5 mg by mouth at bedtime.   amiodarone 100 MG tablet Commonly known as: PACERONE Take 1 tablet (100 mg total) by mouth daily. What changed:  medication strength how much to take   aspirin EC 81 MG tablet Take 1 tablet (81 mg total) by mouth daily with breakfast. What changed: when to take this   hydrOXYzine 100 MG capsule Commonly known as: VISTARIL Take 100 mg by mouth daily as needed for anxiety.   levothyroxine 25 MCG tablet Commonly known as: SYNTHROID Take  1 tablet (25 mcg total) by mouth daily.   lidocaine-prilocaine cream Commonly known as: EMLA Apply 1 application topically every Monday, Wednesday, and Friday. Prior to dialysis   loperamide 2 MG tablet Commonly known as: IMODIUM A-D Take 2 tablets (4 mg total) by mouth daily as needed for diarrhea or loose stools.   metoprolol succinate 25 MG 24 hr tablet Commonly known as: Toprol XL Take 1 tablet (25 mg total) by mouth daily.   midodrine 5 MG tablet Commonly known as: PROAMATINE Take 1 tablet (5 mg total) by mouth See admin instructions. Take 1 to 2 tablets on  Monday, Wednesday, Friday morning BEFORE dialysis What changed:  how much to take how to take this when to take this additional instructions   multivitamin Tabs tablet Take 1 tablet by mouth daily.   nitroGLYCERIN 0.4 MG SL tablet Commonly known as: NITROSTAT Place 1 tablet (0.4 mg total) under the tongue every 5 (five) minutes x 3 doses as needed for chest pain (if no relief after 2nd dose, proceed to the  ED for an evaluation or call 911).   omeprazole 20 MG capsule Commonly known as: PRILOSEC Take 1 capsule (20 mg total) by mouth daily.   rosuvastatin 10 MG tablet Commonly known as: Crestor Take 1 tablet (10 mg total) by mouth daily.   sevelamer carbonate 800 MG tablet Commonly known as: RENVELA Take 3 tablets (2,400 mg total) by mouth 3 (three) times daily with meals.        Major procedures and Radiology Reports - PLEASE review detailed and final reports for all details, in brief -    CARDIAC CATHETERIZATION  Result Date: 07/22/2021 Images from the original result were not included.   Mid LM to Dist LM lesion is 60% stenosed.   Prox LAD lesion is 90% stenosed.   Ost Cx to Mid Cx lesion is 99% stenosed.   Ost RCA to Prox RCA lesion is 100% stenosed.   Ost Ramus to Ramus lesion is 80% stenosed.   Ost 1st Diag to 1st Diag lesion is 100% stenosed.   Ost RPDA to RPDA lesion is 75% stenosed.   2nd Mrg lesion is 60% stenosed.   Dist LAD lesion is 70% stenosed.   LIMA and is large.   SVG and is large.   SVG and is normal in caliber.   SVG and is large.   The graft exhibits no disease.   The graft exhibits no disease.   The graft exhibits no disease.   LV end diastolic pressure is mildly elevated.   The left ventricular ejection fraction is 35-45% by visual estimate. Shawna [name] is a 81 y.o. female  [id number] LOCATION:  FACILITY: [facility] PHYSICIAN: Quay Burow, M.D. 08-13-1940 DATE OF PROCEDURE:  07/22/2021 DATE OF DISCHARGE: CARDIAC CATHETERIZATION History obtained from patient's chart.: 81 y.o. female w/ PMH of CAD (s/p CABG in 2013, DES to D1 in 10/2016, cath in 07/2018 showing patent grafts with occlusion of D1 at prior stent site and progression of PDA disease --> medical management recommended), HFpEF, HTN, HLD, paroxysmal atrial fibrillation (not on anticoagulation given history of GIB), NSVT, chronic anemia and ESRD who is currently admitted for evaluation of recurrent atrial flutter and chest  pain.  Chest pain pressure I suspect to be with is a dialysis patient is a right probably dialyzed and her medications.  Her enzymes were elevated to a troponin of 400.  Her EF was reduced by 2D echo to the 35% range.  She was referred for diagnostic coronary angiography.   Ms. Pennywell anatomy is for all intents and purposes unchanged from her prior cath performed by Dr. Martinique in 2020.  All her grafts are patent.  Her LVEDP was 18.  Her EF looks to be in the 40 to 45% range with an anteroapical wall motion abnormality.  I suspect that her chest pain and mild enzyme leak was related to "demand ischemia".  Medical therapy will be recommended.  If femoral angiogram was performed and the stick appeared to be slightly high and therefore Mynx closure was not performed.  The patient will need a manual hold in the recovery bay prior to going to the floor.  Dr. Domenic Polite, the patient's attending cardiologist, was notified of these results. Quay Burow. MD, [facility] 07/22/2021 11:53 AM    DG Chest Port 1 View  Result Date: 08/15/2021 CLINICAL DATA:  Chest pain EXAM: PORTABLE CHEST 1 VIEW COMPARISON:  Chest x-rays dated 07/17/2021 and 03/29/2017. FINDINGS: Stable cardiomegaly. Median sternotomy wires appear intact and stable in alignment. Coarse lung markings bilaterally. No confluent opacity to suggest a superimposed pneumonia or pulmonary edema. No pleural effusion or pneumothorax is seen. No acute-appearing osseous abnormality. IMPRESSION: 1. No active disease. No evidence of pneumonia or pulmonary edema. 2. Stable cardiomegaly. 3. Coarse lung markings bilaterally, suspected chronic interstitial lung disease. Electronically Signed   By: Franki Cabot M.D.   On: 08/15/2021 12:22    Micro Results   Recent Results (from the past 240 hour(s))  Resp Panel by RT-PCR (Flu A&B, Covid) Nasopharyngeal Swab     Status: None   Collection Time: 08/15/21  4:36 PM   Specimen: Nasopharyngeal Swab; Nasopharyngeal(NP) swabs in vial  transport medium  Result Value Ref Range Status   SARS Coronavirus 2 by RT PCR NEGATIVE NEGATIVE Final    Comment: (NOTE) SARS-CoV-2 target nucleic acids are NOT DETECTED.  The SARS-CoV-2 RNA is generally detectable in upper respiratory specimens during the acute phase of infection. The lowest concentration of SARS-CoV-2 viral copies this assay can detect is 138 copies/mL. A negative result does not preclude SARS-Cov-2 infection and should not be used as the sole basis for treatment or other patient management decisions. A negative result may occur with  improper specimen collection/handling, submission of specimen other than nasopharyngeal swab, presence of viral mutation(s) within the areas targeted by this assay, and inadequate number of viral copies(<138 copies/mL). A negative result must be combined with clinical observations, patient history, and epidemiological information. The expected result is Negative.  Fact Sheet for Patients:  EntrepreneurPulse.com.au  Fact Sheet for Healthcare Providers:  IncredibleEmployment.be  This test is no t yet approved or cleared by the [place] FDA and  has been authorized for detection and/or diagnosis of SARS-CoV-2 by FDA under an Emergency Use Authorization (EUA). This EUA will remain  in effect (meaning this test can be used) for the duration of the COVID-19 declaration under Section 564(b)(1) of the Act, 21 U.S.C.section 360bbb-3(b)(1), unless the authorization is terminated  or revoked sooner.       Influenza A by PCR NEGATIVE NEGATIVE Final   Influenza B by PCR NEGATIVE NEGATIVE Final    Comment: (NOTE) The Xpert Xpress SARS-CoV-2/FLU/RSV plus assay is intended as an aid in the diagnosis of influenza from Nasopharyngeal swab specimens and should not be used as a sole basis for treatment. Nasal washings and aspirates are unacceptable for Xpert Xpress SARS-CoV-2/FLU/RSV testing.  Fact  Sheet for Patients: EntrepreneurPulse.com.au  Fact Sheet for  Healthcare Providers: IncredibleEmployment.be  This test is not yet approved or cleared by the [place] and has been authorized for detection and/or diagnosis of SARS-CoV-2 by FDA under an Emergency Use Authorization (EUA). This EUA will remain in effect (meaning this test can be used) for the duration of the COVID-19 declaration under Section 564(b)(1) of the Act, 21 U.S.C. section 360bbb-3(b)(1), unless the authorization is terminated or revoked.  Performed at [facility], [address] [place], [place] [address]   MRSA Next Gen by PCR, Nasal     Status: None   Collection Time: 08/17/21 10:11 AM   Specimen: Nasal Mucosa; Nasal Swab  Result Value Ref Range Status   MRSA by PCR Next Gen NOT DETECTED NOT DETECTED Final    Comment: (NOTE) The GeneXpert MRSA Assay (FDA approved for NASAL specimens only), is one component of a comprehensive MRSA colonization surveillance program. It is not intended to diagnose MRSA infection nor to guide or monitor treatment for MRSA infections. Test performance is not FDA approved in patients less than 61 years old. Performed at [name], [address], [place], [place] [address]    Today   Subjective    Shawna [name] today has no new complaints No fever  Or chills   No Nausea, Vomiting or Diarrhea         Patient has been seen and examined prior to discharge   Objective   Blood pressure (!) 141/85, pulse 61, temperature (!) 97.4 F (36.3 C), temperature source Axillary, resp. rate 18, height 5\' 2"  (1.575 m), weight 56.6 kg, SpO2 100 %.   Intake/Output Summary (Last 24 hours) at 08/20/2021 1402 Last data filed at 08/20/2021 1227 Gross per 24 hour  Intake 742.19 ml  Output --  Net 742.19 ml    Exam Gen:- Awake Alert, no acute distress  HEENT:- .AT, No sclera icterus Neck-Supple Neck,No JVD,.  Lungs-  CTAB , good air  movement bilaterally CV- S1, S2 normal, irregular, not irregularly irregular  abd-  +ve B.Sounds, Abd Soft, No tenderness,    Extremity/Skin:- No  edema,   good pulses Psych-affect is appropriate, oriented x3 Neuro-no new focal deficits, no tremors  MSK-left UE AV graft with bruit and thrill   Data Review   CBC w Diff:  Lab Results  Component Value Date   WBC 6.2 08/20/2021   HGB 9.1 (L) 08/20/2021   HCT 30.1 (L) 08/20/2021   PLT 172 08/20/2021   LYMPHOPCT 19 08/15/2021   BANDSPCT 13 02/05/2021   MONOPCT 5 08/15/2021   EOSPCT 3 08/15/2021   BASOPCT 0 08/15/2021    CMP:  Lab Results  Component Value Date   NA 135 08/18/2021   K 3.8 08/18/2021   CL 97 (L) 08/18/2021   CO2 26 08/18/2021   BUN 52 (H) 08/18/2021   CREATININE 8.11 (H) 08/18/2021   CREATININE 6.57 (H) 03/05/2019   PROT 6.8 07/06/2021   ALBUMIN 3.3 (L) 08/18/2021   BILITOT 0.5 07/06/2021   ALKPHOS 142 (H) 07/06/2021   AST 10 (L) 07/06/2021   ALT 9 07/06/2021  .  Total Discharge time is about 33 minutes  Roxan Hockey M.D on 08/20/2021 at 2:02 PM  Go to [email or web address] -  for contact info  [facility] - Office  [phone]

## 2021-08-20 NOTE — Progress Notes (Signed)
Patient has been stable.  C/o indigestion earlier in shift and medicated per orders. Patient has had no other complaints this shift.

## 2021-08-20 NOTE — Procedures (Signed)
° °  HEMODIALYSIS TREATMENT NOTE:   Uneventful 3.5 hour heparin-free treatment completed using left upper arm AVG (17g/antegrade - difficult cannulation, as per usual). Hemodynamically stable with HR SB 50s.  Goal met: 1 liter removed.  All blood was returned and hemostasis was achieved in 15 minutes.  No changes from pre-HD assessment.   Rockwell Alexandria, RN

## 2021-08-20 NOTE — Progress Notes (Signed)
Discharge instructions reviewed with patient and patient's husband. Both verbalized understanding of instructions. Patient discharged home with husband in stable condition.   

## 2021-08-20 NOTE — Care Management Important Message (Signed)
Important Message  Patient Details  Name: Shawna [name] MRN: [id number] Date of Birth: [birth year]   Medicare Important Message Given:  Yes     [name] 08/20/2021, 11:58 AM

## 2021-08-20 NOTE — Progress Notes (Signed)
Subjective:  Seen and examined on HD today.  BFR 285 with high AP, UF goal 1.5L.  Tolerating well with excellent BP on the lower dose of midodrine so far.    Objective Vital signs in last 24 hours: Vitals:   08/20/21 0945 08/20/21 1000 08/20/21 1015 08/20/21 1030  BP: (!) 108/48 (!) 119/44 (!) 106/43 (!) 127/48  Pulse: (!) 56 (!) 53 (!) 54 (!) 53  Resp: (!) 21 13 14 19   Temp:      TempSrc:      SpO2:      Weight:      Height:       Weight change: 2.018 kg  Intake/Output Summary (Last 24 hours) at 08/20/2021 1039 Last data filed at 08/20/2021 0900 Gross per 24 hour  Intake 1286.69 ml  Output --  Net 1286.69 ml   Dialysis Orders:  [facility] - MWF-  3.5 hours  EDW 54. HD Bath 2k/2.5 ca, Heparin none. Access LUE AVG BFR 300, DFR 500- 15 guage. Micera 200 mcg IV every 2 weeks   Assessment/Plan: 81 year old F with multiple medical issues and ESRD.  Afib with RVR. 81 CP-  per cards-  just had a cardiac cath-  plan was for medical management-  on ASA and bystolic 2 ESRD: MWF- [facility]-  have been having trouble with UF but tolerated well yesterday.  HD on schedule next 2/10 /23-->doing well so far 3 Hypertension/vol: minimal UF goal--> she is on midodrine 10 mg TID-- BP high, will decrease to 5 TID.  Hopefully will be able to stop midodrine as OP- bystolic started yesterday at 2.5 mg daily- ideally would just be able to do one or othe other 4. Anemia of ESRD: No overt losses--> on ESA 5. Metabolic Bone Disease: will continue home renvela  6.  Atrial fibrillation- s/p amiodarone gtt--> PO amiodarone, NSR 7.  Access: BFR low, high AP- sees Dr Delana Meyer as OP- likely needs to go back to him for graftogram 8.  Dispo: for possible d/c today   [name]    Labs: Basic Metabolic Panel: Recent Labs  Lab 08/15/21 1205 08/16/21 0511 08/18/21 1617  NA 136 140 135  K 3.3* 3.3* 3.8  CL 98 103 97*  CO2 27 25 26   GLUCOSE 173* 82 95  BUN 42* 53* 52*  CREATININE 5.25* 6.87* 8.11*   CALCIUM 9.3 9.4 9.0  PHOS  --   --  3.9   Liver Function Tests: Recent Labs  Lab 08/18/21 1617  ALBUMIN 3.3*   No results for input(s): LIPASE, AMYLASE in the last 168 hours. No results for input(s): AMMONIA in the last 168 hours. CBC: Recent Labs  Lab 08/15/21 1205 08/16/21 0511 08/17/21 0636 08/18/21 0423 08/19/21 0013 08/20/21 0427  WBC 5.5 4.6 6.6 6.3 6.3 6.2  NEUTROABS 4.0  --   --   --   --   --   HGB 9.7* 8.9* 10.0* 8.9* 8.0* 9.1*  HCT 31.2* 27.8* 32.1* 28.0* 25.6* 30.1*  MCV 117.3* 115.4* 115.5* 116.2* 119.1* 117.6*  PLT PLATELET CLUMPS NOTED ON SMEAR 163 226 PLATELET CLUMPS NOTED ON SMEAR, COUNT APPEARS ADEQUATE 174 172   Cardiac Enzymes: No results for input(s): CKTOTAL, CKMB, CKMBINDEX, TROPONINI in the last 168 hours. CBG: Recent Labs  Lab 08/17/21 0547 08/17/21 0815 08/19/21 0514 08/20/21 0522  GLUCAP 104* 130* 83 92    Iron Studies: No results for input(s): IRON, TIBC, TRANSFERRIN, FERRITIN in the last 72 hours. Studies/Results: No  results found. Medications: Infusions:  sodium chloride     sodium chloride      Scheduled Medications:  amiodarone  100 mg Oral Daily   aspirin EC  81 mg Oral Daily   Chlorhexidine Gluconate Cloth  6 each Topical Q0600   Chlorhexidine Gluconate Cloth  6 each Topical Q0600   darbepoetin (ARANESP) injection - DIALYSIS  150 mcg Intravenous Q Mon-HD   levothyroxine  25 mcg Oral Daily   midodrine       midodrine  5 mg Oral Q M,W,F-HD   nebivolol  2.5 mg Oral Daily   rosuvastatin  10 mg Oral Daily   sevelamer carbonate  2,400 mg Oral TID WC    have reviewed scheduled and prn medications.  Physical Exam: General:  alert, eating breakfast-  nad Heart: tachy-  irreg Lungs: mostly clear Abdomen: soft, non tender Extremities: no edema Dialysis Access: left AVG - patent     08/20/2021,10:39 AM  LOS: 4 days

## 2021-08-20 NOTE — Progress Notes (Signed)
Progress Note  Patient Name: Shawna [name] Date of Encounter: 08/20/2021  [facility] Cardiologist: Carlyle Dolly, MD   Subjective   Currently at hemodialysis.  Nursing notes maintain stability.  Complained of a little bit of indigestion previously but overall doing well currently.  Inpatient Medications    Scheduled Meds:  amiodarone  100 mg Oral Daily   aspirin EC  81 mg Oral Daily   Chlorhexidine Gluconate Cloth  6 each Topical Q0600   Chlorhexidine Gluconate Cloth  6 each Topical Q0600   darbepoetin (ARANESP) injection - DIALYSIS  150 mcg Intravenous Q Mon-HD   levothyroxine  25 mcg Oral Daily   midodrine  5 mg Oral Q M,W,F-HD   nebivolol  2.5 mg Oral Daily   rosuvastatin  10 mg Oral Daily   sevelamer carbonate  2,400 mg Oral TID WC   Continuous Infusions:  sodium chloride     sodium chloride     PRN Meds: sodium chloride, sodium chloride, alum & mag hydroxide-simeth, lidocaine (PF), lidocaine-prilocaine, nitroGLYCERIN, pentafluoroprop-tetrafluoroeth, polyethylene glycol   Vital Signs    Vitals:   08/19/21 2139 08/20/21 0122 08/20/21 0500 08/20/21 0519  BP: (!) 143/54 (!) 138/56  (!) 149/56  Pulse: (!) 57 (!) 54  (!) 55  Resp: 20 18  18   Temp: 98.6 F (37 C) 98.4 F (36.9 C)  98 F (36.7 C)  TempSrc: Oral Oral    SpO2: 92% 94%  100%  Weight:   56.6 kg   Height:        Intake/Output Summary (Last 24 hours) at 08/20/2021 0854 Last data filed at 08/20/2021 0500 Gross per 24 hour  Intake 1046.69 ml  Output --  Net 1046.69 ml   Last 3 Weights 08/20/2021 08/19/2021 08/19/2021  Weight (lbs) 124 lb 12.5 oz 124 lb 9.6 oz 130 lb 11.7 oz  Weight (kg) 56.6 kg 56.518 kg 59.3 kg      Telemetry    Sinus bradycardia rate in the 50s- Personally Reviewed     Labs    High Sensitivity Troponin:   Recent Labs  Lab 08/15/21 1205 08/15/21 1401 08/16/21 0511 08/16/21 0851  TROPONINIHS 27* 27* 46* 49*     Chemistry Recent Labs  Lab 08/15/21 1205  08/15/21 1401 08/16/21 0511 08/18/21 1617  NA 136  --  140 135  K 3.3*  --  3.3* 3.8  CL 98  --  103 97*  CO2 27  --  25 26  GLUCOSE 173*  --  82 95  BUN 42*  --  53* 52*  CREATININE 5.25*  --  6.87* 8.11*  CALCIUM 9.3  --  9.4 9.0  MG  --  1.9  --   --   ALBUMIN  --   --   --  3.3*  GFRNONAA 8*  --  6* 5*  ANIONGAP 11  --  12 12    Lipids No results for input(s): CHOL, TRIG, HDL, LABVLDL, LDLCALC, CHOLHDL in the last 168 hours.  Hematology Recent Labs  Lab 08/18/21 0423 08/19/21 0013 08/20/21 0427  WBC 6.3 6.3 6.2  RBC 2.41* 2.15* 2.56*  HGB 8.9* 8.0* 9.1*  HCT 28.0* 25.6* 30.1*  MCV 116.2* 119.1* 117.6*  MCH 36.9* 37.2* 35.5*  MCHC 31.8 31.3 30.2  RDW 19.9* 20.1* 19.6*  PLT PLATELET CLUMPS NOTED ON SMEAR, COUNT APPEARS ADEQUATE 174 172   Thyroid No results for input(s): TSH, FREET4 in the last 168 hours.  BNP Recent Labs  Lab  08/15/21 2040  BNP 1,051.0*    DDimer No results for input(s): DDIMER in the last 168 hours.   Radiology    No results found.  Cardiac Studies   Echo-EF 30 to 35% severely dilated left atrium mild to moderate mitral stenosis  Left heart catheterization in 2023-medical management grafts are patent.  Patient Profile     81 y.o. female with CAD post CABG distal small vessel disease with systolic heart failure end-stage renal disease on hemodialysis, recurrent GI bleed with chronic anemia 08/15/2021 with recurrent atrial flutter and chest pain symptoms.  Complicated by hypotension not being able to tolerate hemodialysis.  Assessment & Plan    Atrial flutter with rapid ventricular sponsor - Did convert to normal sinus rhythm on 08/18/2021 - Prior sick sinus syndrome with change to oral amiodarone 100 mg a day, currently on low-dose Bystolic 2.5 mg. -Not a anticoagulation candidate based upon bleeding risks.  Chest pain in the setting of atrial flutter with rapid ventricular response - Troponins flat, cardiac catheterization last month  resulted in medical therapy. - Status post CABG 2013 DES to D1 in 2018 cath in 2020 patent grafts repeat catheterization 2023 patent grafts known PDA disease similar to 2020.  Currently chest pain-free.  Chronic systolic heart failure - On low-dose Bystolic.  Seems stable.  Fluid management by hemodialysis.  Prior EF 35%.  Hypotension - Has had difficulty tolerating hemodialysis. - On midodrine.  Blood pressures have been elevated at times.  Hopefully will do better normal sinus rhythm  Anemia - Required transfusion during this hospitalization.  Hyperlipidemia - Moved from simvastatin over to Crestor.  LDL 48 recently.  End-stage renal disease - Monday was a Friday schedule.   For questions or updates, please contact [place] Please consult [email or web address] for contact info under        Signed, Candee Furbish, MD  08/20/2021, 8:54 AM

## 2021-08-20 NOTE — TOC Transition Note (Signed)
Transition of Care [facility]) - CM/SW Discharge Note   Patient Details  Name: Shawna [name] MRN: [id number] Date of Birth: February 24, 1941  [facility] CM/SW Contact:  Boneta Lucks, RN Phone Number: 08/20/2021, 11:34 AM   Clinical Narrative:  Patient admitted with chest pain.Patient has a high risk for readmission. TOC visiting patient in dialysis today.  Patient is a [place] resident lives in a single family home with her husband. Patient also reported that at baseline she is able to perform her ADL's independently. She has a walker and all equipment needed in the home. She has used [facility] home [facility] in the past, discharge recently. She goes to [facility] in [place] for dialysis. He husband provides transportation. Patient has no need needs and will go home after dialysis.     Final next level of care: Home/Self Care Barriers to Discharge: Barriers Resolved   Patient Goals and CMS Choice Patient states their goals for this hospitalization and ongoing recovery are:: to go home. CMS Medicare.gov Compare Post Acute Care list provided to:: Patient Choice offered to / list presented to : Patient  Discharge Placement                Patient to be transferred to facility by: Husband Name of family member notified: patient only Patient and family notified of of transfer: 08/20/21  Discharge Plan and Services                                     Social Determinants of Health (SDOH) Interventions     Readmission Risk Interventions Readmission Risk Prevention Plan 07/23/2021 11/11/2020 10/04/2020  Transportation Screening Complete Complete Complete  PCP or Specialist Appt within 3-5 Days - - -  HRI or [place] Work Consult for [place] - - -  Medication Review Press photographer) Complete Complete Complete  PCP or Specialist appointment within 3-5 days of discharge Complete Complete Complete  HRI or  Home Care Consult Complete Complete Complete  SW Recovery Care/Counseling Consult Complete Complete Complete  Palliative Care Screening Not Applicable Not Applicable Not Applicable  Skilled Nursing Facility Not Applicable Not Applicable Complete  Some recent data might be hidden

## 2021-08-20 NOTE — Discharge Instructions (Signed)
1)Very low-salt diet advised--less than 2 g of sodium per day 2)Weigh yourself daily, call if you gain more than 3 pounds in 1 day or more than 5 pounds in 1 week as your hemodialysis regimen/schedule and dry weight may need to be adjusted 3)Limit your Fluid  intake to no more than 60 ounces (1.8 Liters) per day 4)Please note that there has been several changes to your medications--- 5) your amiodarone has been decreased to 100 mg daily 6) please continue outpatient hemodialysis on Mondays Wednesdays and Fridays 7)You need a repeat CBC and BMP blood test on Monday, 08/23/2021

## 2021-08-30 ENCOUNTER — Emergency Department (HOSPITAL_COMMUNITY): Payer: Medicare HMO

## 2021-08-30 ENCOUNTER — Emergency Department (HOSPITAL_COMMUNITY)
Admission: EM | Admit: 2021-08-30 | Discharge: 2021-08-30 | Disposition: A | Discharge status: Home / Self Care | Payer: Medicare HMO | Attending: Emergency Medicine | Admitting: Emergency Medicine

## 2021-08-30 ENCOUNTER — Other Ambulatory Visit: Payer: Self-pay

## 2021-08-30 ENCOUNTER — Encounter (HOSPITAL_COMMUNITY): Payer: Self-pay

## 2021-08-30 DIAGNOSIS — N186 End stage renal disease: Secondary | ICD-10-CM | POA: Insufficient documentation

## 2021-08-30 DIAGNOSIS — R519 Headache, unspecified: Secondary | ICD-10-CM | POA: Insufficient documentation

## 2021-08-30 DIAGNOSIS — Z85038 Personal history of other malignant neoplasm of large intestine: Secondary | ICD-10-CM | POA: Insufficient documentation

## 2021-08-30 DIAGNOSIS — I480 Paroxysmal atrial fibrillation: Secondary | ICD-10-CM | POA: Diagnosis not present

## 2021-08-30 DIAGNOSIS — I132 Hypertensive heart and chronic kidney disease with heart failure and with stage 5 chronic kidney disease, or end stage renal disease: Secondary | ICD-10-CM | POA: Insufficient documentation

## 2021-08-30 DIAGNOSIS — I5032 Chronic diastolic (congestive) heart failure: Secondary | ICD-10-CM | POA: Insufficient documentation

## 2021-08-30 DIAGNOSIS — Z951 Presence of aortocoronary bypass graft: Secondary | ICD-10-CM | POA: Diagnosis not present

## 2021-08-30 DIAGNOSIS — I2510 Atherosclerotic heart disease of native coronary artery without angina pectoris: Secondary | ICD-10-CM | POA: Insufficient documentation

## 2021-08-30 DIAGNOSIS — Z992 Dependence on renal dialysis: Secondary | ICD-10-CM | POA: Insufficient documentation

## 2021-08-30 DIAGNOSIS — R0789 Other chest pain: Secondary | ICD-10-CM | POA: Diagnosis not present

## 2021-08-30 DIAGNOSIS — R079 Chest pain, unspecified: Secondary | ICD-10-CM

## 2021-08-30 LAB — TROPONIN I (HIGH SENSITIVITY)
Troponin I (High Sensitivity): 32 ng/L — ABNORMAL HIGH (ref <18)
Troponin I (High Sensitivity): 33 ng/L — ABNORMAL HIGH (ref <18)

## 2021-08-30 LAB — BASIC METABOLIC PANEL
Anion gap: 12 (ref 5–15)
BUN: 67 mg/dL — ABNORMAL HIGH (ref 8–23)
CO2: 28 mmol/L (ref 22–32)
Calcium: 9.5 mg/dL (ref 8.9–10.3)
Chloride: 99 mmol/L (ref 98–111)
Creatinine, Ser: 10.18 mg/dL — ABNORMAL HIGH (ref 0.44–1.00)
GFR, Estimated: 3 mL/min — ABNORMAL LOW (ref >60)
Glucose, Bld: 103 mg/dL — ABNORMAL HIGH (ref 70–99)
Potassium: 4.8 mmol/L (ref 3.5–5.1)
Sodium: 139 mmol/L (ref 135–145)

## 2021-08-30 LAB — CBC
HCT: 25.7 % — ABNORMAL LOW (ref 36.0–46.0)
Hemoglobin: 7.6 g/dL — ABNORMAL LOW (ref 12.0–15.0)
MCH: 35.3 pg — ABNORMAL HIGH (ref 26.0–34.0)
MCHC: 29.6 g/dL — ABNORMAL LOW (ref 30.0–36.0)
MCV: 119.5 fL — ABNORMAL HIGH (ref 80.0–100.0)
Platelets: ADEQUATE 10*3/uL (ref 150–400)
RBC: 2.15 MIL/uL — ABNORMAL LOW (ref 3.87–5.11)
RDW: 18.6 % — ABNORMAL HIGH (ref 11.5–15.5)
WBC: 6.6 10*3/uL (ref 4.0–10.5)
nRBC: 0 % (ref 0.0–0.2)

## 2021-08-30 IMAGING — CT CT HEAD W/O CM
4 series · 16 of 47 positions shown, 18 images · non-contrast
Comparison: [DATE]

CLINICAL DATA: Right-sided headache.

EXAM:
CT HEAD WITHOUT CONTRAST
TECHNIQUE: Contiguous axial images were obtained from the base of the skull
through the vertex without intravenous contrast.
RADIATION DOSE REDUCTION: This exam was performed according to the
departmental dose-optimization program which includes automated
exposure control, adjustment of the mA and/or kV according to
patient size and/or use of iterative reconstruction technique.

[Series 2: head w o · axial · 0.40mm/px · z∈[-82,+18]mm · 7 of 28 slices shown, 9 images]
[im 4/28  brain]
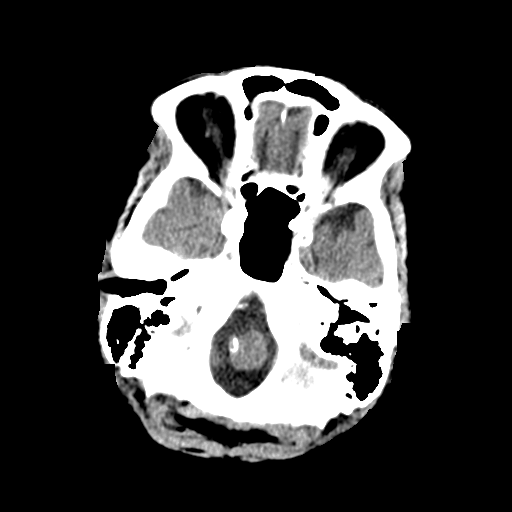
[im 4/28  bone]
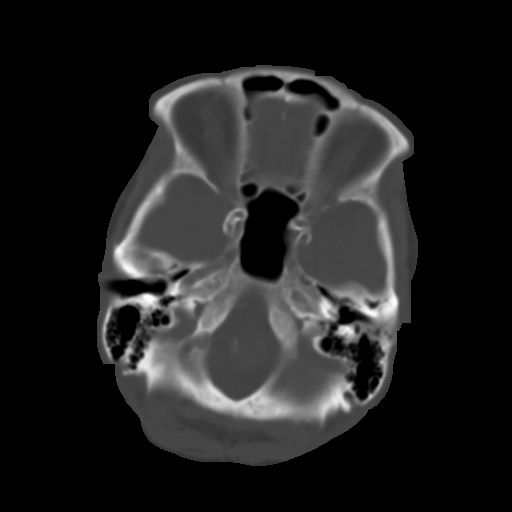
[im 7/28  brain]
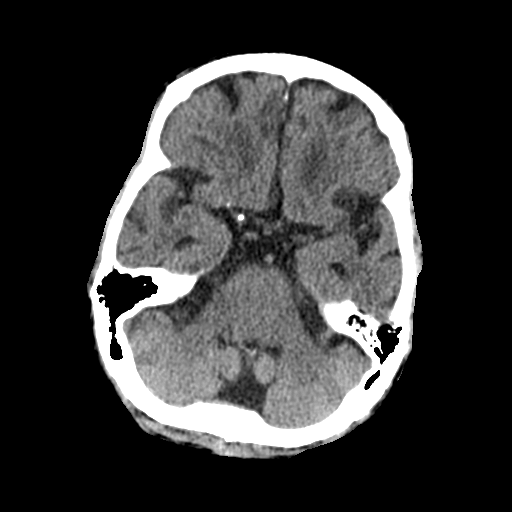
[im 11/28  brain]
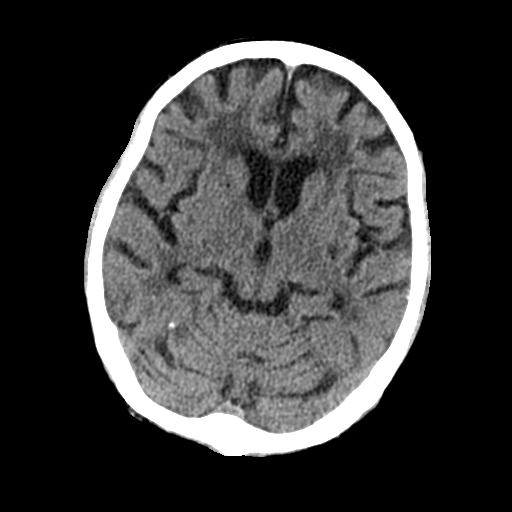
[im 14/28  brain]
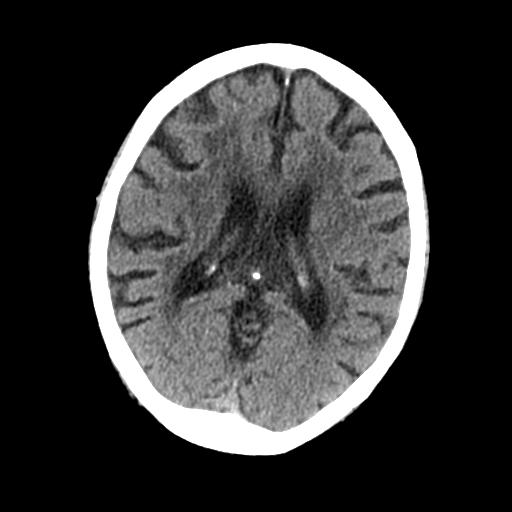
[im 17/28  brain]
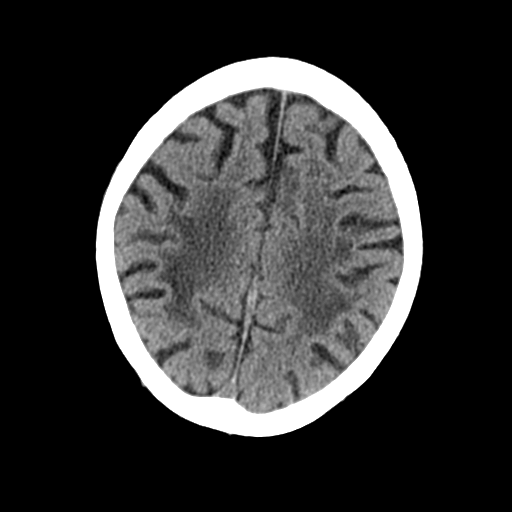
[im 17/28  bone]
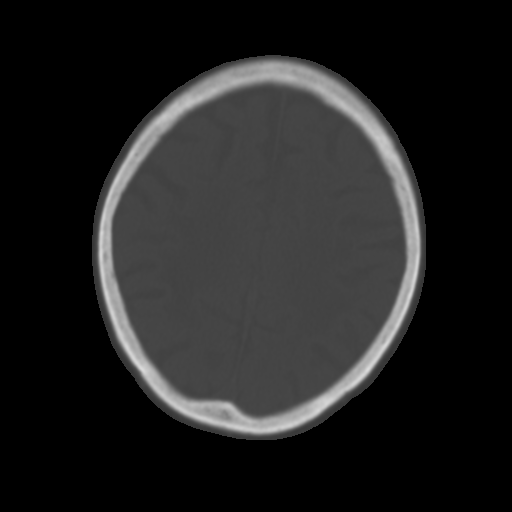
[im 21/28  brain]
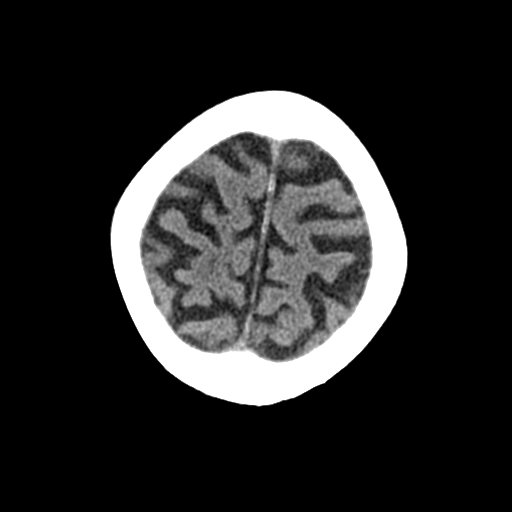
[im 24/28  brain]
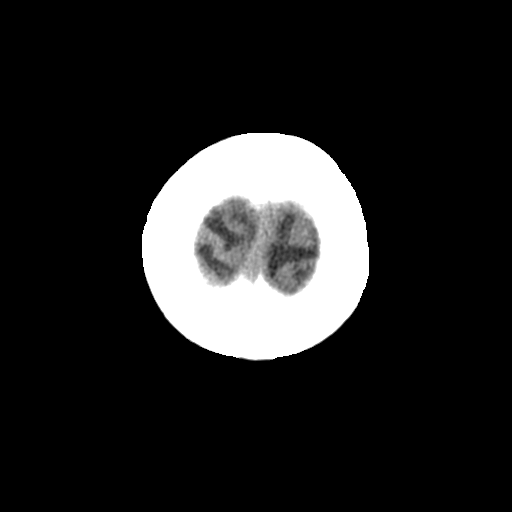

[Series 3: head bone · axial · 0.40mm/px · z∈[-85,-57]mm · 3 of 70 slices shown]
[im 7/70  bone]
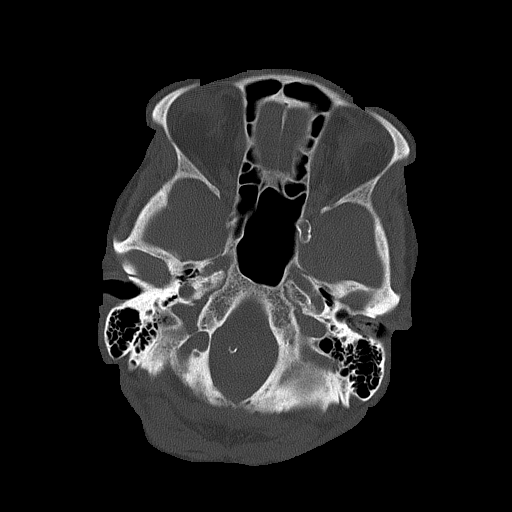
[im 14/70  bone]
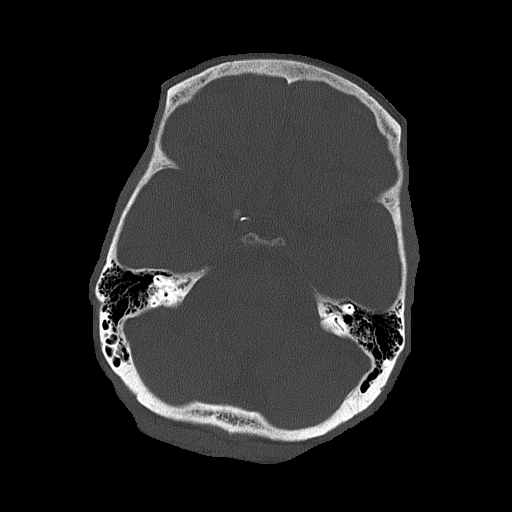
[im 21/70  bone]
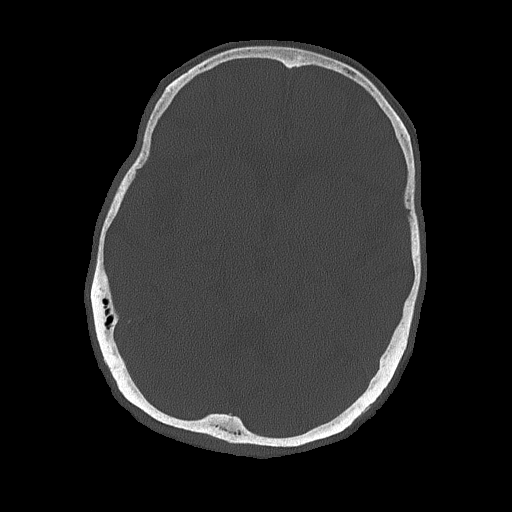

[Series 4: coronal soft · coronal · 0.34mm/px · 3 of 61 slices shown]
[im 21/61  brain]
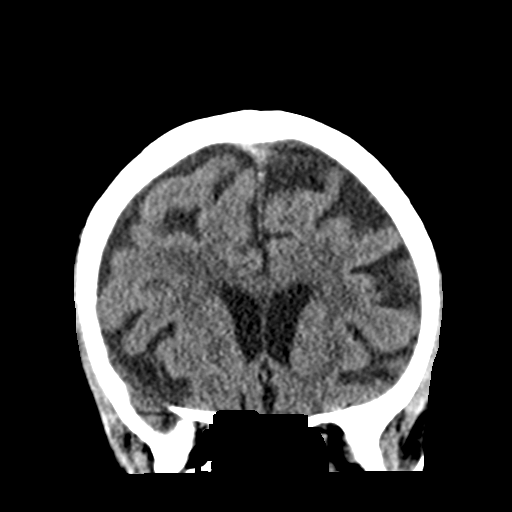
[im 27/61  brain]
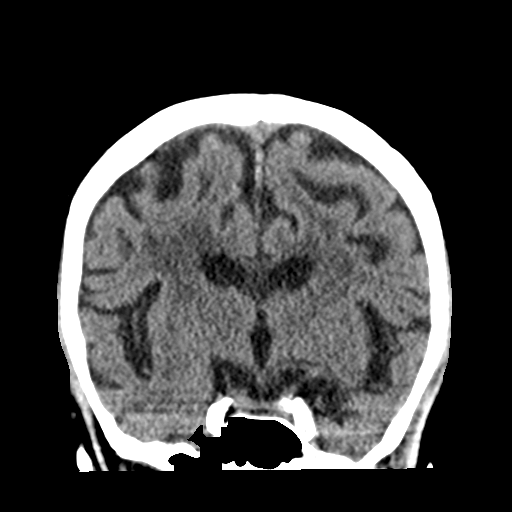
[im 34/61  brain]
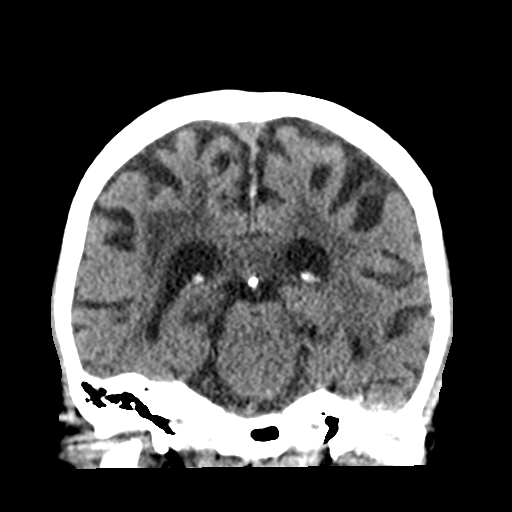

[Series 5: sagittal soft · sagittal · 0.30mm/px · 3 of 56 slices shown]
[im 19/56  brain]
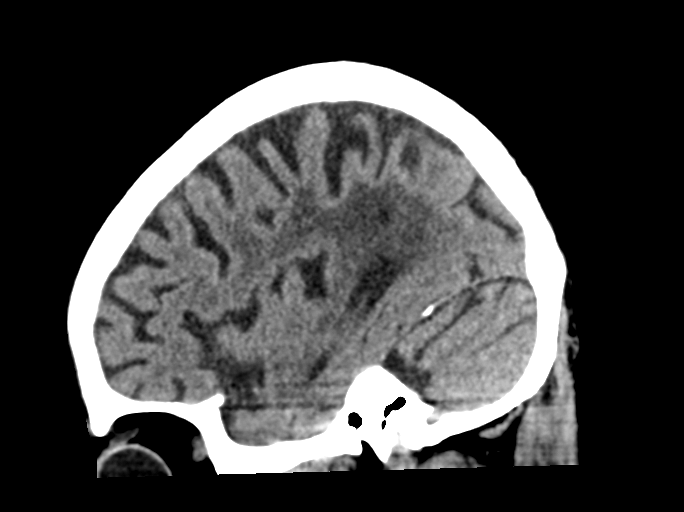
[im 28/56  brain]
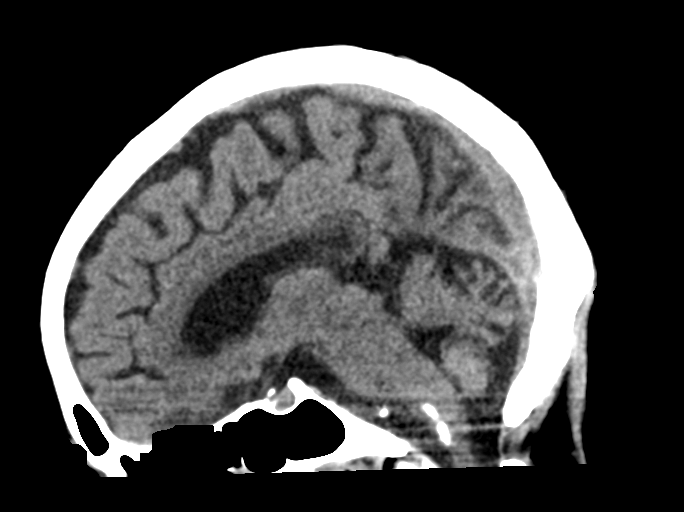
[im 37/56  brain]
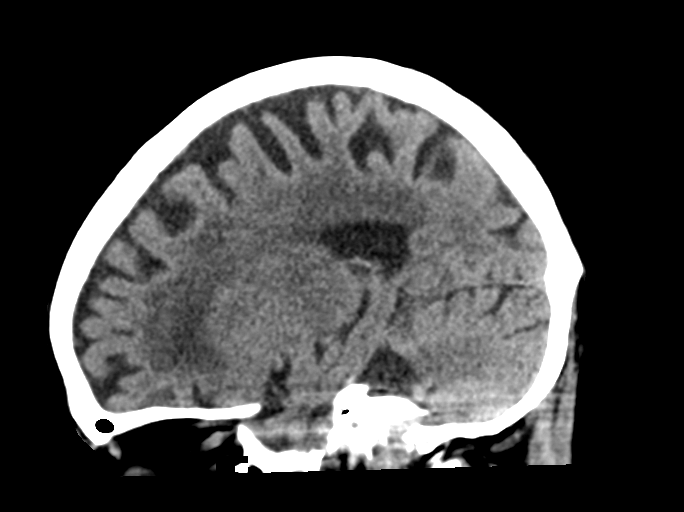

[16 of 47 positions shown; findings below may reference images not displayed]

FINDINGS: Brain: There is mild cerebral atrophy with widening of the
extra-axial spaces and ventricular dilatation.
There are areas of decreased attenuation within the white matter
tracts of the supratentorial brain, consistent with microvascular
disease changes.

Vascular: No hyperdense vessel or unexpected calcification.

Skull: Normal. Negative for fracture or focal lesion.

Sinuses/Orbits: No acute finding.

Other: None.
IMPRESSION: No acute intracranial abnormality.

## 2021-08-30 MED ORDER — DIPHENHYDRAMINE HCL 50 MG/ML IJ SOLN: 12.5000 mg | Freq: Once | INTRAMUSCULAR | Status: DC

## 2021-08-30 MED ORDER — PROCHLORPERAZINE EDISYLATE 10 MG/2ML IJ SOLN: 5.0000 mg | Freq: Once | INTRAMUSCULAR | Status: DC

## 2021-08-30 NOTE — ED Provider Notes (Signed)
[facility] Emergency Department Provider Note MRN:  [id number]  Arrival date & time: 08/30/21     Chief Complaint   Headache   History of Present Illness   Shawna [name] is a 81 y.o. year-old female with a history of CAD presenting to the ED with chief complaint of headache.  Patient was laying in bed and experienced sudden onset left-sided chest pain described as a pressure/sharp pain.  Took a nitroglycerin tablet and the chest pain has resolved.  She now endorses a severe frontal headache.  She has never experienced a headache after nitroglycerin before.  Denies numbness or weakness to the arms or legs.  She had some nausea with the chest pain which is also resolved.  Review of Systems  A thorough review of systems was obtained and all systems are negative except as noted in the HPI and PMH.   Patient's Health History    Past Medical History:  Diagnosis Date   Acute on chronic respiratory failure with hypoxia ([place]) 10/10/2016   Anxiety    Arthritis    AVM (arteriovenous malformation) of colon    CAD (coronary artery disease)    a. s/p CABG in 2013 b. DES to D1 in 10/2016. c. cath in 07/2018 showing patent grafts with occlusion of D1 at prior stent site and progression of PDA disease --> medical management recommended   Carotid artery disease ([place])    a. 62-70% LICA, 09/5007    Chronic anemia    Chronic bronchitis (HCC)    Chronic diastolic CHF (congestive heart failure) ([place])    a. 02/2012 Echo EF 60-65%, nl wall motion, Gr 1 DD, mod MR   Colon cancer ([place]) 1992   Esophageal stricture    ESRD on hemodialysis ([place])    ESRD due to HTN, started dialysis 2011 and gets HD at [facility] with Dr Hinda Lenis on MWF schedule.  Access is LUA AVF as of Sept 2014.    GERD (gastroesophageal reflux disease)    High cholesterol 12/2011   History of blood transfusion 07/2011; 12/2011; 01/2012 X 2; 04/2012   History of gout    History of lower GI bleeding    Hypertension     Iron deficiency anemia    Jugular vein occlusion, right (HCC)    Mitral regurgitation    a. Moderate by echo, 02/2012   Myocardial infarction [facility])    NSVT (nonsustained ventricular tachycardia)    Ovarian cancer ([place]) 1992   PAF (paroxysmal atrial fibrillation) ([place])    Pneumonia ~ 2009   PUD (peptic ulcer disease)    TIA (transient ischemic attack)     Past Surgical History:  Procedure Laterality Date   A/V SHUNTOGRAM Left 03/19/2019   Procedure: A/V SHUNTOGRAM;  Surgeon: Katha Cabal, MD;  Location: [place] [facility];  Service: Cardiovascular;  Laterality: Left;   ABDOMINAL HYSTERECTOMY  1992   APPENDECTOMY  06/1990   AV FISTULA PLACEMENT  07/2009   left upper arm   AV FISTULA PLACEMENT Right 09/06/2016   Procedure: RIGHT FOREARM ARTERIOVENOUS (AV) GRAFT;  Surgeon: Elam Dutch, MD;  Location: [place];  Service: Vascular;  Laterality: Right;   AV FISTULA PLACEMENT N/A 02/24/2017   Procedure: INSERTION OF ARTERIOVENOUS (AV) GORE-TEX GRAFT ARM (BRACHIAL AXILLARY);  Surgeon: Katha Cabal, MD;  Location: [facility];  Service: Vascular;  Laterality: N/A;   [place] Right 09/06/2016   Procedure: REMOVAL OF Right Arm ARTERIOVENOUS GORETEX GRAFT and Vein Patch angioplasty of  brachial artery;  Surgeon: Angelia Mould, MD;  Location: [place];  Service: Vascular;  Laterality: Right;   BIOPSY  09/26/2019   Procedure: BIOPSY;  Surgeon: Rogene Houston, MD;  [facility];  Service: Endoscopy;;   COLON RESECTION  1992   COLON SURGERY     COLONOSCOPY N/A 03/09/2019   Procedure: COLONOSCOPY;  Surgeon: Rogene Houston, MD;  [facility];  Service: Endoscopy;  Laterality: N/A;   COLONOSCOPY WITH PROPOFOL N/A 06/08/2021   Procedure: COLONOSCOPY WITH PROPOFOL;  Surgeon: Harvel Quale, MD;  [facility];  Service: Gastroenterology;  Laterality: N/A;  9:05 /Patient is on dialysis Mon Wed Fri   CORONARY ANGIOPLASTY WITH STENT PLACEMENT   12/15/11   "2"   CORONARY ANGIOPLASTY WITH STENT PLACEMENT  y/2013   "1; makes total of 3" (05/02/2012)   CORONARY ARTERY BYPASS GRAFT  06/13/2012   Procedure: CORONARY ARTERY BYPASS GRAFTING (CABG);  Surgeon: Grace Isaac, MD;  Location: [place];  Service: Open Heart Surgery;  Laterality: N/A;  cabg x four;  using left internal mammary artery, and left leg greater saphenous vein harvested endoscopically   CORONARY STENT INTERVENTION N/A 10/13/2016   Procedure: Coronary Stent Intervention;  Surgeon: Troy Sine, MD;  Location: [place] [facility];  Service: Cardiovascular;  Laterality: N/A;   DIALYSIS/PERMA CATHETER REMOVAL N/A 04/18/2017   Procedure: DIALYSIS/PERMA CATHETER REMOVAL;  Surgeon: Katha Cabal, MD;  Location: [place] [facility];  Service: Cardiovascular;  Laterality: N/A;   DILATION AND CURETTAGE OF UTERUS     ENTEROSCOPY N/A 06/08/2021   Procedure: PUSH ENTEROSCOPY;  Surgeon: Harvel Quale, MD;  [facility];  Service: Gastroenterology;  Laterality: N/A;   ESOPHAGOGASTRODUODENOSCOPY  01/20/2012   Procedure: ESOPHAGOGASTRODUODENOSCOPY (EGD);  Surgeon: Ladene Artist, MD,FACG;  Location: [facility] ENDOSCOPY;  Service: Endoscopy;  Laterality: N/A;   ESOPHAGOGASTRODUODENOSCOPY N/A 03/26/2013   Procedure: ESOPHAGOGASTRODUODENOSCOPY (EGD);  Surgeon: Irene Shipper, MD;  Location[facility] END[facility]COPY;  Service: Endoscopy;  Laterality: N/A;   ESOPHAGOGASTRODUODENOSCOPY N/A 04/30/2015   Procedure: ESOPHAGOGASTRODUODENOSCOPY (EGD);  Surgeon: Rogene Houston, MD;  [facility];  Service: Endoscopy;  Laterality: N/A;  1pm - moved to 10/20 @ 1:10   ESOPHAGOGASTRODUODENOSCOPY N/A 07/29/2016   Procedure: ESOPHAGOGASTRODUODENOSCOPY (EGD);  Surgeon: Manus Gunning, MD;  Location: [place];  Service: Gastroenterology;  Laterality: N/A;  enteroscopy   ESOPHAGOGASTRODUODENOSCOPY N/A 09/26/2019   Procedure: ESOPHAGOGASTRODUODENOSCOPY (EGD);  Surgeon: Rogene Houston,  MD;  [facility];  Service: Endoscopy;  Laterality: N/A;  1250   ESOPHAGOGASTRODUODENOSCOPY (EGD) WITH PROPOFOL N/A 02/05/2021   Procedure: ESOPHAGOGASTRODUODENOSCOPY (EGD) WITH PROPOFOL;  Surgeon: Eloise Harman, DO;  [facility];  Service: Endoscopy;  Laterality: N/A;   GIVENS CAPSULE STUDY N/A 03/07/2019   Procedure: GIVENS CAPSULE STUDY;  Surgeon: Rogene Houston, MD;  [facility];  Service: Endoscopy;  Laterality: N/A;  7:30   GIVENS CAPSULE STUDY N/A 04/22/2021   Procedure: GIVENS CAPSULE STUDY;  Surgeon: Rogene Houston, MD;  [facility];  Service: Endoscopy;  Laterality: N/A;  7:30   INSERTION OF DIALYSIS CATHETER N/A 10/05/2020   Procedure: ABORTED TUNNELED DIALYSIS CATHETER PLACEMENT RIGHT INTERNAL JUGULAR VEIN ;  Surgeon: Virl Cagey, MD;  [facility]: [facility];  Service: General;  Laterality: N/A;   INTRAOPERATIVE TRANSESOPHAGEAL ECHOCARDIOGRAM  06/13/2012   Procedure: INTRAOPERATIVE TRANSESOPHAGEAL ECHOCARDIOGRAM;  Surgeon: Grace Isaac, MD;  Location: [place];  Service: Open Heart Surgery;  Laterality: N/A;   IR DIALY SHUNT INTRO NEEDLE/INTRACATH INITIAL W/IMG LEFT Left 10/06/2020   IR FLUORO GUIDE CV LINE RIGHT  06/17/2020   IR GENERIC HISTORICAL  07/26/2016   IR FLUORO GUIDE CV LINE RIGHT 07/26/2016 Greggory Keen, MD [facility]   IR GENERIC HISTORICAL  07/26/2016   IR US GUIDE VASC ACCESS RIGHT 07/26/2016 Greggory Keen, MD [facility]   IR GENERIC HISTORICAL  08/02/2016   IR US GUIDE VASC ACCESS RIGHT 08/02/2016 Greggory Keen, MD [facility]   IR GENERIC HISTORICAL  08/02/2016   IR FLUORO GUIDE CV LINE RIGHT 08/02/2016 Greggory Keen, MD [facility]   IR RADIOLOGY PERIPHERAL GUIDED IV START  03/28/2017   IR REMOVAL TUN CV CATH W/O FL  08/11/2020   IR THROMBECTOMY AV FISTULA W/THROMBOLYSIS INC/SHUNT/IMG LEFT Left 06/17/2020   IR US GUIDE VASC ACCESS LEFT  06/17/2020   IR US GUIDE VASC ACCESS RIGHT  03/28/2017   IR US GUIDE VASC  ACCESS RIGHT  06/17/2020   LEFT HEART CATH AND CORONARY ANGIOGRAPHY N/A 09/20/2016   Procedure: Left Heart Cath and Coronary Angiography;  Surgeon: [name], MD;  Location: [place] [facility];  Service: Cardiovascular;  Laterality: N/A;   LEFT HEART CATH AND CORS/GRAFTS ANGIOGRAPHY N/A 10/13/2016   Procedure: Left Heart Cath and Cors/Grafts Angiography;  Surgeon: Troy Sine, MD;  Location: [place] [facility];  Service: Cardiovascular;  Laterality: N/A;   LEFT HEART CATH AND CORS/GRAFTS ANGIOGRAPHY N/A 07/13/2018   Procedure: LEFT HEART CATH AND CORS/GRAFTS ANGIOGRAPHY;  Surgeon: Martinique, Peter M, MD;  Location: [place] [facility];  Service: Cardiovascular;  Laterality: N/A;   LEFT HEART CATH AND CORS/GRAFTS ANGIOGRAPHY N/A 07/22/2021   Procedure: LEFT HEART CATH AND CORS/GRAFTS ANGIOGRAPHY;  Surgeon: Lorretta Harp, MD;  Location: [place] [facility];  Service: Cardiovascular;  Laterality: N/A;   LEFT HEART CATHETERIZATION WITH CORONARY ANGIOGRAM N/A 12/15/2011   Procedure: LEFT HEART CATHETERIZATION WITH CORONARY ANGIOGRAM;  Surgeon: Burnell Blanks, MD;  Location: [facility] CATH LAB;  Service: Cardiovascular;  Laterality: N/A;   LEFT HEART CATHETERIZATION WITH CORONARY ANGIOGRAM N/A 01/10/2012   Procedure: LEFT HEART CATHETERIZATION WITH CORONARY ANGIOGRAM;  Surgeon: Peter M Martinique, MD;  Location: [facility] CATH LAB;  Service: Cardiovascular;  Laterality: N/A;   LEFT HEART CATHETERIZATION WITH CORONARY ANGIOGRAM N/A 06/08/2012   Procedure: LEFT HEART CATHETERIZATION WITH CORONARY ANGIOGRAM;  Surgeon: Burnell Blanks, MD;  Location: [facility] CATH LAB;  Service: Cardiovascular;  Laterality: N/A;   LEFT HEART CATHETERIZATION WITH CORONARY/GRAFT ANGIOGRAM N/A 12/10/2013   Procedure: LEFT HEART CATHETERIZATION WITH Beatrix Fetters;  Surgeon: [name], MD;  Location: [facility] CATH LAB;  Service: Cardiovascular;  Laterality: N/A;   OVARY SURGERY     ovarian cancer   POLYPECTOMY  03/09/2019    Procedure: POLYPECTOMY;  Surgeon: Rogene Houston, MD;  [facility];  Service: Endoscopy;;  cecal    POLYPECTOMY N/A 09/26/2019   Procedure: DUODENAL POLYPECTOMY;  Surgeon: Rogene Houston, MD;  [facility];  Service: Endoscopy;  Laterality: N/A;   REVISION OF ARTERIOVENOUS GORETEX GRAFT N/A 02/24/2017   Procedure: REVISION OF ARTERIOVENOUS GORETEX GRAFT (RESECTION);  Surgeon: Katha Cabal, MD;  Location: [facility];  Service: Vascular;  Laterality: N/A;   REVISON OF ARTERIOVENOUS FISTULA Left 06/19/2020   Procedure: REVISION OF LEFT UPPER ARM AV GRAFT WITH INTERPOSITION JUMP GRAFT USING 6MM GORE LIMB;  Surgeon: Marty Heck, MD;  [facility];  Service: Vascular;  Laterality: Left;   SHUNTOGRAM N/A 10/15/2013   Procedure: Fistulogram;  Surgeon: Serafina Mitchell, MD;  Location: [facility] CATH LAB;  Service: Cardiovascular;  Laterality: N/A;   THROMBECTOMY / ARTERIOVENOUS GRAFT REVISION  2011   left upper arm   TUBAL LIGATION  1980's   UPPER EXTREMITY ANGIOGRAPHY Bilateral 12/06/2016   Procedure: Upper Extremity Angiography;  Surgeon: Katha Cabal, MD;  Location: [place] [facility];  Service: Cardiovascular;  Laterality: Bilateral;   UPPER EXTREMITY INTERVENTION Left 06/06/2017   Procedure: UPPER EXTREMITY INTERVENTION;  Surgeon: Katha Cabal, MD;  Location: [place] [facility];  Service: Cardiovascular;  Laterality: Left;    Family History  Problem Relation Age of Onset   Heart disease Mother        Heart Disease before age 72   Hyperlipidemia Mother    Hypertension Mother    Diabetes Mother    Heart attack Mother    Heart disease Father        Heart Disease before age 59   Hyperlipidemia Father    Hypertension Father    Diabetes Father    Diabetes Sister    Hypertension Sister    Diabetes Brother    Hyperlipidemia Brother    Heart attack Brother    Hypertension Sister    Heart attack Brother    Colon cancer Child 58   Other Other         noncontributory for early CAD   Esophageal cancer Neg Hx    Liver disease Neg Hx    Kidney disease Neg Hx    Colon polyps Neg Hx     Social History   Socioeconomic History   Marital status: Married    Spouse name: Lorilynn Lehr   Number of children: 1   Years of education: Not on file   Highest education level: GED or equivalent  Occupational History   Occupation: retired- Technical sales engineer- [employer]  Tobacco Use   Smoking status: Never   Smokeless tobacco: Never  Vaping Use   Vaping Use: Never used  Substance and Sexual Activity   Alcohol use: No    Alcohol/week: 0.0 standard drinks   Drug use: No   Sexual activity: Not Currently    Birth control/protection: Surgical  Other Topics Concern   Not on file  Social History Narrative   Lives in [place], [place] with husband.  Dialysis pt - [facility].   Social Determinants of Health   Financial Resource Strain: Not on file  Food Insecurity: Not on file  Transportation Needs: Not on file  Physical Activity: Not on file  Stress: Not on file  Social Connections: Not on file  Intimate Partner Violence: Not on file     Physical Exam   Vitals:   08/30/21 0300 08/30/21 0600  BP: (!) 153/51 (!) 148/53  Pulse: (!) 59 (!) 56  Resp: 19 18  Temp:    SpO2: 95% 94%    CONSTITUTIONAL: Well-appearing, NAD NEURO/PSYCH:  Alert and oriented x 3, normal and symmetric strength and sensation, normal coordination, normal speech EYES:  eyes equal and reactive ENT/NECK:  no LAD, no JVD CARDIO: Regular rate, well-perfused, normal S1 and S2 PULM:  CTAB no wheezing or rhonchi GI/GU:  non-distended, non-tender MSK/SPINE:  No gross deformities, no edema SKIN:  no rash, atraumatic   *Additional and/or pertinent findings included in MDM below  Diagnostic and Interventional Summary    EKG Interpretation  Date/Time:  Monday August 30 2021 02:27:29 EST Ventricular Rate:  61 PR Interval:  201 QRS Duration: 131 QT Interval:  490 QTC  Calculation: 494 R Axis:   72 Text Interpretation: Sinus rhythm Left bundle branch block Confirmed by Gerlene Fee [phone]) on 08/30/2021 2:50:38 AM       Labs Reviewed  CBC - Abnormal; Notable for the following components:      Result Value   RBC 2.15 (*)    Hemoglobin 7.6 (*)    HCT 25.7 (*)    MCV 119.5 (*)    MCH 35.3 (*)    MCHC 29.6 (*)    RDW 18.6 (*)    All other components within normal limits  BASIC METABOLIC PANEL - Abnormal; Notable for the following components:   Glucose, Bld 103 (*)    BUN 67 (*)    Creatinine, Ser 10.18 (*)    GFR, Estimated 3 (*)    All other components within normal limits  TROPONIN I (HIGH SENSITIVITY) - Abnormal; Notable for the following components:   Troponin I (High Sensitivity) 32 (*)    All other components within normal limits  TROPONIN I (HIGH SENSITIVITY) - Abnormal; Notable for the following components:   Troponin I (High Sensitivity) 33 (*)    All other components within normal limits    CT HEAD WO CONTRAST (5MM)  Final Result    DG Chest Port 1 View  Final Result      Medications - No data to display    Procedures  /  Critical Care Procedures  ED Course and Medical Decision Making  Initial Impression and Ddx Chest pain, extensive coronary artery disease.  Multiple recent admissions.  Cath report from 1 month ago showing extensive stenosis of multiple vessels.  And so unstable angina or possibly NSTEMI is considered.  EKG is without obvious changes.  Headache likely triggered by nitroglycerin though given age and lack of this happening in the past with nitroglycerin will obtain screening CT head  Past medical/surgical history that increases complexity of ED encounter: CAD  Interpretation of Diagnostics I personally reviewed the EKG and my interpretation is as follows: Bundle branch block, no significant change from prior    Laboratory assessment is reassuring with no significant blood count or electrolyte disturbance.   Troponin is minimally elevated and flat upon repeat  Patient Reassessment and Ultimate Disposition/Management Patient has had no return of pain, headache is resolved.  Patient prefers discharge so that she can go to dialysis today and follow-up with her regular doctors.  Patient management required discussion with the following services or consulting groups:  None  Complexity of Problems Addressed Acute illness or injury that poses threat of life of bodily function  Additional Data Reviewed and Analyzed Further history obtained from: Further history from spouse/family member  Additional Factors Impacting ED Encounter Risk Consideration of hospitalization  Barth Kirks. Sedonia Small, MD [place] [email or web address]  Final Clinical Impressions(s) / ED Diagnoses     ICD-10-CM   1. Acute nonintractable headache, unspecified headache type  R51.9     2. Chest pain, unspecified type  R07.9       ED Discharge Orders     None        Discharge Instructions Discussed with and Provided to Patient:     Discharge Instructions      You were evaluated in the Emergency Department and after careful evaluation, we did not find any emergent condition requiring admission or further  testing in the hospital.  Your exam/testing today is overall reassuring.  Recommend close follow-up with your cardiologist to discuss your symptoms.  Please return to the Emergency Department if you experience any worsening of your condition.   Thank you for allowing [place] to be a part of your care.       Maudie Flakes, MD 08/30/21 [phone]

## 2021-08-30 NOTE — Discharge Instructions (Signed)
You were evaluated in the Emergency Department and after careful evaluation, we did not find any emergent condition requiring admission or further testing in the hospital.  Your exam/testing today is overall reassuring.  Recommend close follow-up with your cardiologist to discuss your symptoms.  Please return to the Emergency Department if you experience any worsening of your condition.   Thank you for allowing us to be a part of your care. 

## 2021-08-30 NOTE — ED Triage Notes (Signed)
Pt arrived from home via [facility] with current c/o headache right sided 5/10 on pain scale. States that her head and chest began hurting around 10pm. Pt states that she took her nitro and now chest no longer hurts at present.

## 2021-09-09 ENCOUNTER — Other Ambulatory Visit: Payer: Self-pay

## 2021-09-09 ENCOUNTER — Encounter (HOSPITAL_COMMUNITY): Payer: Self-pay

## 2021-09-09 ENCOUNTER — Observation Stay (HOSPITAL_COMMUNITY)
Admission: EM | Admit: 2021-09-09 | Discharge: 2021-09-10 | Disposition: A | Discharge status: Home Health | Payer: Medicare HMO | Attending: Internal Medicine | Admitting: Internal Medicine

## 2021-09-09 ENCOUNTER — Observation Stay (HOSPITAL_COMMUNITY): Payer: Medicare HMO

## 2021-09-09 DIAGNOSIS — I5041 Acute combined systolic (congestive) and diastolic (congestive) heart failure: Secondary | ICD-10-CM | POA: Diagnosis present

## 2021-09-09 DIAGNOSIS — Z79899 Other long term (current) drug therapy: Secondary | ICD-10-CM | POA: Insufficient documentation

## 2021-09-09 DIAGNOSIS — E785 Hyperlipidemia, unspecified: Secondary | ICD-10-CM | POA: Diagnosis present

## 2021-09-09 DIAGNOSIS — E7800 Pure hypercholesterolemia, unspecified: Secondary | ICD-10-CM | POA: Insufficient documentation

## 2021-09-09 DIAGNOSIS — D649 Anemia, unspecified: Secondary | ICD-10-CM | POA: Insufficient documentation

## 2021-09-09 DIAGNOSIS — R627 Adult failure to thrive: Secondary | ICD-10-CM

## 2021-09-09 DIAGNOSIS — Z8249 Family history of ischemic heart disease and other diseases of the circulatory system: Secondary | ICD-10-CM | POA: Insufficient documentation

## 2021-09-09 DIAGNOSIS — R2681 Unsteadiness on feet: Secondary | ICD-10-CM | POA: Insufficient documentation

## 2021-09-09 DIAGNOSIS — R2689 Other abnormalities of gait and mobility: Secondary | ICD-10-CM | POA: Diagnosis not present

## 2021-09-09 DIAGNOSIS — I480 Paroxysmal atrial fibrillation: Secondary | ICD-10-CM | POA: Diagnosis not present

## 2021-09-09 DIAGNOSIS — Z713 Dietary counseling and surveillance: Secondary | ICD-10-CM | POA: Insufficient documentation

## 2021-09-09 DIAGNOSIS — I5042 Chronic combined systolic (congestive) and diastolic (congestive) heart failure: Secondary | ICD-10-CM | POA: Diagnosis not present

## 2021-09-09 DIAGNOSIS — E039 Hypothyroidism, unspecified: Secondary | ICD-10-CM | POA: Insufficient documentation

## 2021-09-09 DIAGNOSIS — R079 Chest pain, unspecified: Secondary | ICD-10-CM | POA: Diagnosis not present

## 2021-09-09 DIAGNOSIS — Z6740 Type O blood, Rh positive: Secondary | ICD-10-CM | POA: Insufficient documentation

## 2021-09-09 DIAGNOSIS — Z955 Presence of coronary angioplasty implant and graft: Secondary | ICD-10-CM | POA: Insufficient documentation

## 2021-09-09 DIAGNOSIS — I4892 Unspecified atrial flutter: Secondary | ICD-10-CM | POA: Insufficient documentation

## 2021-09-09 DIAGNOSIS — N186 End stage renal disease: Secondary | ICD-10-CM | POA: Insufficient documentation

## 2021-09-09 DIAGNOSIS — Z7982 Long term (current) use of aspirin: Secondary | ICD-10-CM | POA: Insufficient documentation

## 2021-09-09 DIAGNOSIS — I132 Hypertensive heart and chronic kidney disease with heart failure and with stage 5 chronic kidney disease, or end stage renal disease: Secondary | ICD-10-CM | POA: Insufficient documentation

## 2021-09-09 DIAGNOSIS — R0602 Shortness of breath: Secondary | ICD-10-CM | POA: Insufficient documentation

## 2021-09-09 DIAGNOSIS — R519 Headache, unspecified: Secondary | ICD-10-CM | POA: Diagnosis not present

## 2021-09-09 DIAGNOSIS — R55 Syncope and collapse: Secondary | ICD-10-CM | POA: Insufficient documentation

## 2021-09-09 DIAGNOSIS — Z85038 Personal history of other malignant neoplasm of large intestine: Secondary | ICD-10-CM | POA: Diagnosis not present

## 2021-09-09 DIAGNOSIS — I491 Atrial premature depolarization: Secondary | ICD-10-CM | POA: Insufficient documentation

## 2021-09-09 DIAGNOSIS — Z8349 Family history of other endocrine, nutritional and metabolic diseases: Secondary | ICD-10-CM | POA: Diagnosis not present

## 2021-09-09 DIAGNOSIS — R531 Weakness: Secondary | ICD-10-CM | POA: Diagnosis not present

## 2021-09-09 DIAGNOSIS — I5022 Chronic systolic (congestive) heart failure: Secondary | ICD-10-CM | POA: Diagnosis present

## 2021-09-09 DIAGNOSIS — I2510 Atherosclerotic heart disease of native coronary artery without angina pectoris: Secondary | ICD-10-CM | POA: Diagnosis not present

## 2021-09-09 DIAGNOSIS — E538 Deficiency of other specified B group vitamins: Secondary | ICD-10-CM | POA: Insufficient documentation

## 2021-09-09 DIAGNOSIS — D62 Acute posthemorrhagic anemia: Secondary | ICD-10-CM | POA: Diagnosis present

## 2021-09-09 DIAGNOSIS — D631 Anemia in chronic kidney disease: Secondary | ICD-10-CM | POA: Insufficient documentation

## 2021-09-09 DIAGNOSIS — M898X9 Other specified disorders of bone, unspecified site: Secondary | ICD-10-CM | POA: Insufficient documentation

## 2021-09-09 DIAGNOSIS — Z20822 Contact with and (suspected) exposure to covid-19: Secondary | ICD-10-CM | POA: Insufficient documentation

## 2021-09-09 DIAGNOSIS — Z8673 Personal history of transient ischemic attack (TIA), and cerebral infarction without residual deficits: Secondary | ICD-10-CM | POA: Insufficient documentation

## 2021-09-09 DIAGNOSIS — Z992 Dependence on renal dialysis: Secondary | ICD-10-CM | POA: Diagnosis not present

## 2021-09-09 DIAGNOSIS — R42 Dizziness and giddiness: Secondary | ICD-10-CM | POA: Insufficient documentation

## 2021-09-09 DIAGNOSIS — K219 Gastro-esophageal reflux disease without esophagitis: Secondary | ICD-10-CM | POA: Diagnosis not present

## 2021-09-09 DIAGNOSIS — I447 Left bundle-branch block, unspecified: Secondary | ICD-10-CM | POA: Insufficient documentation

## 2021-09-09 DIAGNOSIS — Z951 Presence of aortocoronary bypass graft: Secondary | ICD-10-CM | POA: Insufficient documentation

## 2021-09-09 DIAGNOSIS — D500 Iron deficiency anemia secondary to blood loss (chronic): Secondary | ICD-10-CM | POA: Diagnosis not present

## 2021-09-09 DIAGNOSIS — E782 Mixed hyperlipidemia: Secondary | ICD-10-CM | POA: Diagnosis present

## 2021-09-09 DIAGNOSIS — E1122 Type 2 diabetes mellitus with diabetic chronic kidney disease: Secondary | ICD-10-CM | POA: Insufficient documentation

## 2021-09-09 DIAGNOSIS — I482 Chronic atrial fibrillation, unspecified: Secondary | ICD-10-CM | POA: Diagnosis present

## 2021-09-09 DIAGNOSIS — I10 Essential (primary) hypertension: Secondary | ICD-10-CM | POA: Diagnosis present

## 2021-09-09 DIAGNOSIS — F419 Anxiety disorder, unspecified: Secondary | ICD-10-CM | POA: Insufficient documentation

## 2021-09-09 DIAGNOSIS — D6489 Other specified anemias: Secondary | ICD-10-CM | POA: Diagnosis not present

## 2021-09-09 DIAGNOSIS — Z833 Family history of diabetes mellitus: Secondary | ICD-10-CM | POA: Insufficient documentation

## 2021-09-09 DIAGNOSIS — R0600 Dyspnea, unspecified: Secondary | ICD-10-CM | POA: Insufficient documentation

## 2021-09-09 DIAGNOSIS — I4891 Unspecified atrial fibrillation: Secondary | ICD-10-CM | POA: Diagnosis present

## 2021-09-09 DIAGNOSIS — Z79890 Hormone replacement therapy (postmenopausal): Secondary | ICD-10-CM | POA: Insufficient documentation

## 2021-09-09 LAB — COMPREHENSIVE METABOLIC PANEL
ALT: 9 U/L (ref 0–44)
AST: 6 U/L — ABNORMAL LOW (ref 15–41)
Albumin: 3.7 g/dL (ref 3.5–5.0)
Alkaline Phosphatase: 140 U/L — ABNORMAL HIGH (ref 38–126)
Anion gap: 14 (ref 5–15)
BUN: 39 mg/dL — ABNORMAL HIGH (ref 8–23)
CO2: 28 mmol/L (ref 22–32)
Calcium: 9.4 mg/dL (ref 8.9–10.3)
Chloride: 100 mmol/L (ref 98–111)
Creatinine, Ser: 6.44 mg/dL — ABNORMAL HIGH (ref 0.44–1.00)
GFR, Estimated: 6 mL/min — ABNORMAL LOW (ref >60)
Glucose, Bld: 101 mg/dL — ABNORMAL HIGH (ref 70–99)
Potassium: 3.6 mmol/L (ref 3.5–5.1)
Sodium: 142 mmol/L (ref 135–145)
Total Bilirubin: 0.4 mg/dL (ref 0.3–1.2)
Total Protein: 6.9 g/dL (ref 6.5–8.1)

## 2021-09-09 LAB — RESP PANEL BY RT-PCR (FLU A&B, COVID) ARPGX2
Influenza A by PCR: NEGATIVE
Influenza B by PCR: NEGATIVE
SARS Coronavirus 2 by RT PCR: NEGATIVE

## 2021-09-09 LAB — CBC
HCT: 22 % — ABNORMAL LOW (ref 36.0–46.0)
Hemoglobin: 6.6 g/dL — CL (ref 12.0–15.0)
MCH: 36.1 pg — ABNORMAL HIGH (ref 26.0–34.0)
MCHC: 30 g/dL (ref 30.0–36.0)
MCV: 120.2 fL — ABNORMAL HIGH (ref 80.0–100.0)
RBC: 1.83 MIL/uL — ABNORMAL LOW (ref 3.87–5.11)
RDW: 17.2 % — ABNORMAL HIGH (ref 11.5–15.5)
WBC: 7.1 10*3/uL (ref 4.0–10.5)
nRBC: 0 % (ref 0.0–0.2)

## 2021-09-09 LAB — PREPARE RBC (CROSSMATCH)

## 2021-09-09 LAB — CBG MONITORING, ED: Glucose-Capillary: 135 mg/dL — ABNORMAL HIGH (ref 70–99)

## 2021-09-09 LAB — TSH: TSH: 4.132 u[IU]/mL (ref 0.350–4.500)

## 2021-09-09 LAB — POC OCCULT BLOOD, ED: Fecal Occult Bld: NEGATIVE

## 2021-09-09 LAB — TROPONIN I (HIGH SENSITIVITY): Troponin I (High Sensitivity): 32 ng/L — ABNORMAL HIGH (ref <18)

## 2021-09-09 IMAGING — DX DG CHEST 1V PORT
1 series · 1 of 1 positions shown · non-contrast
Comparison: Chest radiograph dated [DATE].

CLINICAL DATA: Blood transfusion.  Shortness of breath.

EXAM:
PORTABLE CHEST 1 VIEW

[chest ap]
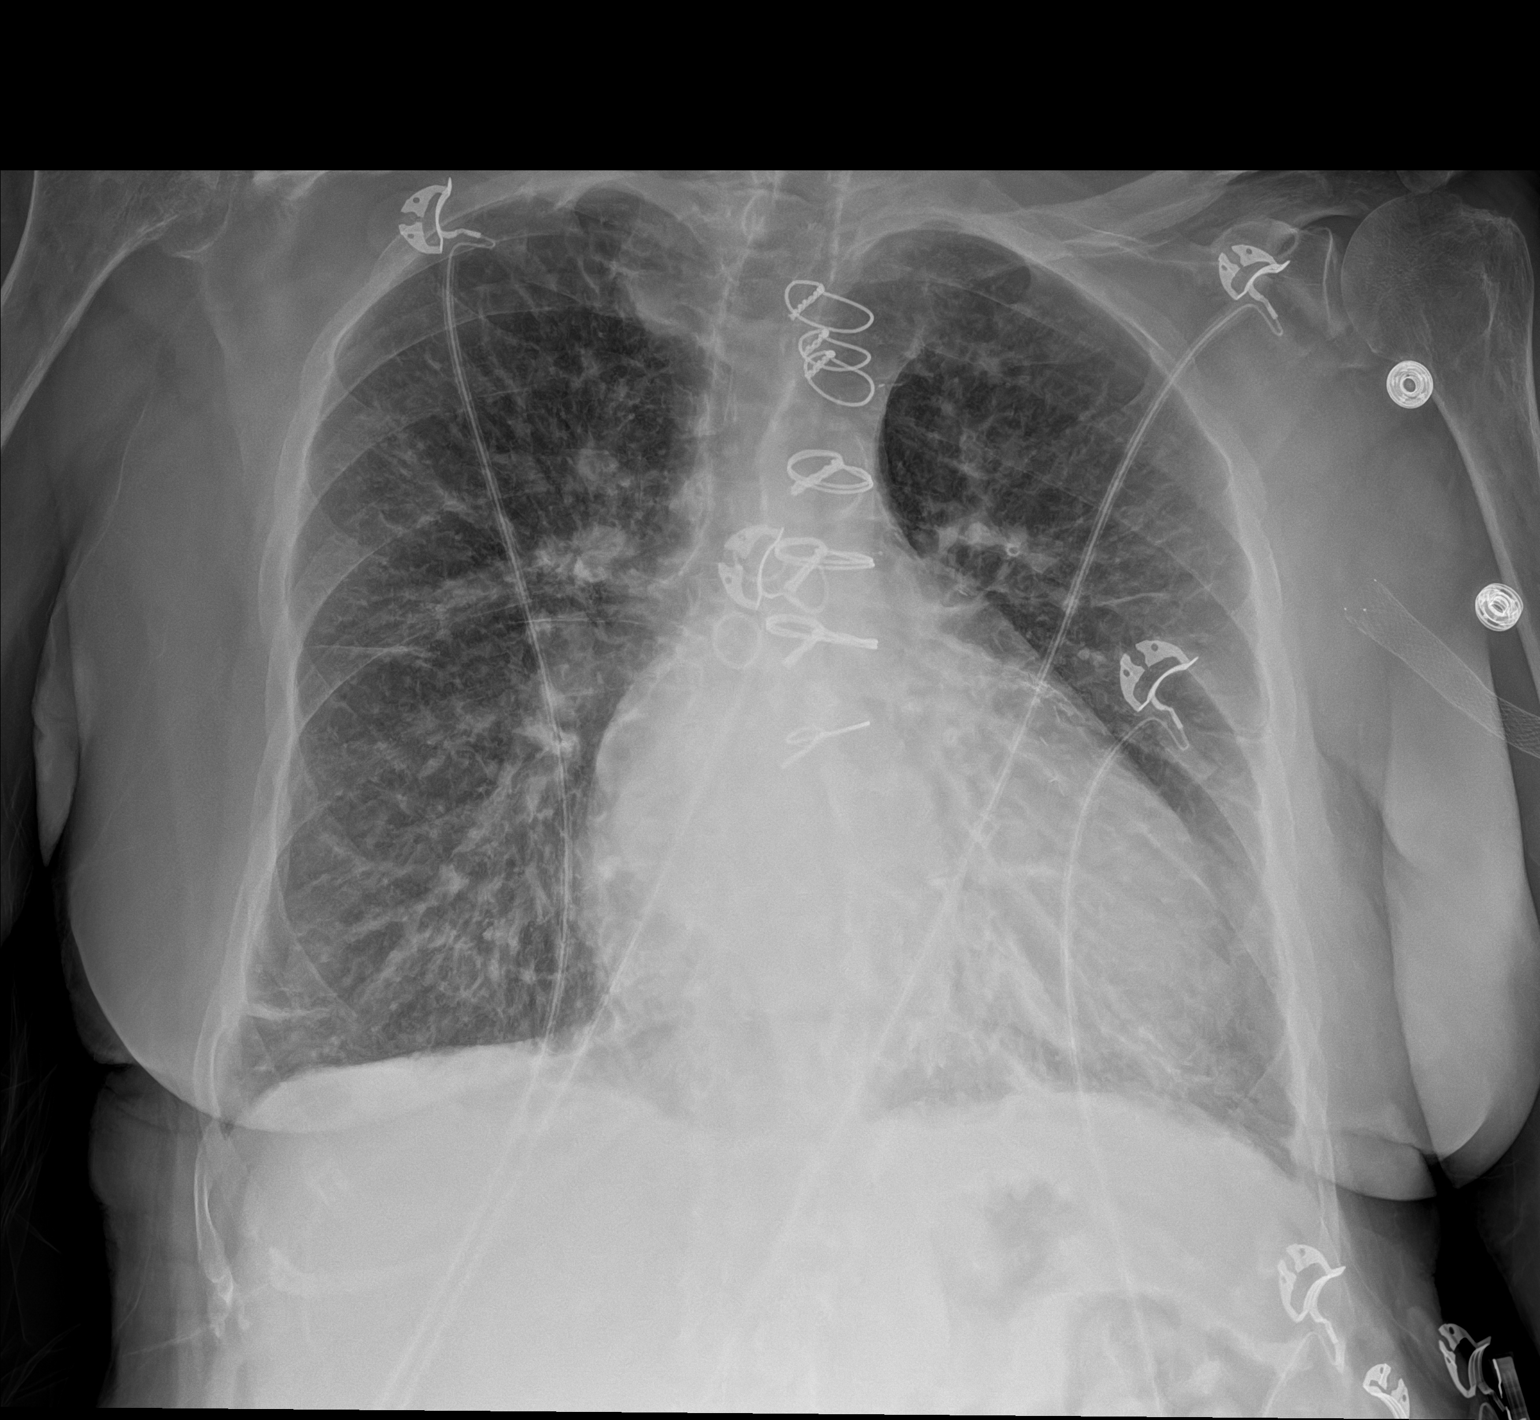

[1 of 1 positions shown; findings below may reference images not displayed]

FINDINGS: Cardiomegaly with vascular congestion and probable mild edema,
increased since the prior radiograph. Pneumonia is not excluded
clinical correlation is recommended. No focal consolidation, pleural
effusion, pneumothorax. Median sternotomy wires and CABG vascular
clips. No acute osseous pathology. Degenerative changes of spine.
IMPRESSION: Cardiomegaly with findings of CHF. Pneumonia is not excluded.

## 2021-09-09 MED ORDER — METOPROLOL SUCCINATE ER 25 MG PO TB24: 25.0000 mg | ORAL_TABLET | Freq: Every day | ORAL | Status: DC

## 2021-09-09 MED ORDER — DIPHENHYDRAMINE HCL 50 MG/ML IJ SOLN: 12.5000 mg | Freq: Three times a day (TID) | INTRAMUSCULAR | Status: DC | PRN

## 2021-09-09 MED ORDER — LEVOTHYROXINE SODIUM 50 MCG PO TABS: 25.0000 ug | ORAL_TABLET | Freq: Every day | ORAL | Status: DC

## 2021-09-09 MED ORDER — ACETAMINOPHEN 650 MG RE SUPP: 650.0000 mg | Freq: Four times a day (QID) | RECTAL | Status: DC | PRN

## 2021-09-09 MED ORDER — AMIODARONE HCL 200 MG PO TABS: 100.0000 mg | ORAL_TABLET | Freq: Every day | ORAL | Status: DC

## 2021-09-09 MED ORDER — INSULIN ASPART 100 UNIT/ML IJ SOLN: 0.0000 [IU] | Freq: Three times a day (TID) | INTRAMUSCULAR | Status: DC

## 2021-09-09 MED ORDER — SODIUM CHLORIDE 0.9 % IV BOLUS
1000.0000 mL | Freq: Once | INTRAVENOUS | Status: AC
  Administered 2021-09-09: 1000 mL via INTRAVENOUS

## 2021-09-09 MED ORDER — LEVOTHYROXINE SODIUM 25 MCG PO TABS
25.0000 ug | ORAL_TABLET | Freq: Every day | ORAL | Status: DC
  Administered 2021-09-10: 25 ug via ORAL
  Filled 2021-09-09: qty 1

## 2021-09-09 MED ORDER — ROSUVASTATIN CALCIUM 10 MG PO TABS
10.0000 mg | ORAL_TABLET | Freq: Every day | ORAL | Status: DC
  Administered 2021-09-10: 10 mg via ORAL
  Filled 2021-09-09: qty 1

## 2021-09-09 MED ORDER — MIDODRINE HCL 5 MG PO TABS: 5.0000 mg | ORAL_TABLET | ORAL | Status: DC

## 2021-09-09 MED ORDER — PROMETHAZINE HCL 12.5 MG PO TABS: 12.5000 mg | ORAL_TABLET | Freq: Four times a day (QID) | ORAL | Status: DC | PRN

## 2021-09-09 MED ORDER — POLYETHYLENE GLYCOL 3350 17 G PO PACK: 17.0000 g | PACK | Freq: Every day | ORAL | Status: DC | PRN

## 2021-09-09 MED ORDER — OXYCODONE HCL 5 MG PO TABS: 5.0000 mg | ORAL_TABLET | ORAL | Status: DC | PRN

## 2021-09-09 MED ORDER — MIDODRINE HCL 5 MG PO TABS
5.0000 mg | ORAL_TABLET | ORAL | Status: DC
  Administered 2021-09-10: 5 mg via ORAL
  Filled 2021-09-09: qty 1

## 2021-09-09 MED ORDER — ALPRAZOLAM 0.5 MG PO TABS
0.5000 mg | ORAL_TABLET | Freq: Every day | ORAL | Status: DC
  Administered 2021-09-09: 0.5 mg via ORAL
  Filled 2021-09-09: qty 1

## 2021-09-09 MED ORDER — NITROGLYCERIN 0.4 MG SL SUBL: 0.4000 mg | SUBLINGUAL_TABLET | SUBLINGUAL | Status: DC | PRN

## 2021-09-09 MED ORDER — ASPIRIN EC 81 MG PO TBEC
81.0000 mg | DELAYED_RELEASE_TABLET | Freq: Every day | ORAL | Status: DC
  Administered 2021-09-10: 81 mg via ORAL
  Filled 2021-09-09: qty 1

## 2021-09-09 MED ORDER — FAMOTIDINE IN NACL 20-0.9 MG/50ML-% IV SOLN
20.0000 mg | Freq: Once | INTRAVENOUS | Status: AC
Start: 2021-09-09 — End: 2021-09-09
  Administered 2021-09-09: 20 mg via INTRAVENOUS
  Filled 2021-09-09: qty 50

## 2021-09-09 MED ORDER — MORPHINE SULFATE (PF) 2 MG/ML IV SOLN: 2.0000 mg | INTRAVENOUS | Status: DC | PRN

## 2021-09-09 MED ORDER — HYDROXYZINE HCL 25 MG PO TABS
100.0000 mg | ORAL_TABLET | Freq: Every day | ORAL | Status: DC | PRN
  Administered 2021-09-09: 100 mg via ORAL
  Filled 2021-09-09: qty 4

## 2021-09-09 MED ORDER — SODIUM CHLORIDE 0.9 % IV SOLN
10.0000 mL/h | Freq: Once | INTRAVENOUS | Status: AC
  Administered 2021-09-10: 10 mL/h via INTRAVENOUS

## 2021-09-09 MED ORDER — PANTOPRAZOLE SODIUM 40 MG PO TBEC
40.0000 mg | DELAYED_RELEASE_TABLET | Freq: Every day | ORAL | Status: DC
  Administered 2021-09-10: 40 mg via ORAL
  Filled 2021-09-09: qty 1

## 2021-09-09 MED ORDER — HEPARIN SODIUM (PORCINE) 5000 UNIT/ML IJ SOLN
5000.0000 [IU] | Freq: Three times a day (TID) | INTRAMUSCULAR | Status: DC
  Administered 2021-09-09 – 2021-09-10 (×2): 5000 [IU] via SUBCUTANEOUS
  Filled 2021-09-09 (×2): qty 1

## 2021-09-09 MED ORDER — SEVELAMER CARBONATE 800 MG PO TABS
2400.0000 mg | ORAL_TABLET | Freq: Three times a day (TID) | ORAL | Status: DC
  Administered 2021-09-10 (×2): 2400 mg via ORAL
  Filled 2021-09-09 (×2): qty 3

## 2021-09-09 MED ORDER — INSULIN ASPART 100 UNIT/ML IJ SOLN: 0.0000 [IU] | Freq: Every day | INTRAMUSCULAR | Status: DC

## 2021-09-09 MED ORDER — ACETAMINOPHEN 325 MG PO TABS
650.0000 mg | ORAL_TABLET | Freq: Four times a day (QID) | ORAL | Status: DC | PRN
  Administered 2021-09-10: 650 mg via ORAL

## 2021-09-09 NOTE — ED Provider Notes (Signed)
[facility] EMERGENCY DEPARTMENT Provider Note   CSN: [id number] Arrival date & time: 09/09/21  1557     History  Chief Complaint  Patient presents with   Abnormal Lab    Shawna [name] is a 81 y.o. female.  Patient is a dialysis patient and has a history of coronary disease.  She has been very weak recently.  Her doctor checked her blood yesterday and her hemoglobin was 6 so she was sent here for transfusion  The history is provided by the patient and medical records. No language interpreter was used.  Extremity Weakness This is a new problem. The current episode started 6 to 12 hours ago. The problem occurs constantly. Associated symptoms include chest pain. Pertinent negatives include no abdominal pain and no headaches. The symptoms are aggravated by intercourse. Nothing relieves the symptoms. She has tried nothing for the symptoms. The treatment provided no relief.      Home Medications Prior to Admission medications   Medication Sig Start Date End Date Taking? Authorizing Provider  ALPRAZolam Duanne Moron) 0.5 MG tablet Take 0.5 mg by mouth at bedtime. 03/20/21   [provider]  amiodarone (PACERONE) 100 MG tablet Take 1 tablet (100 mg total) by mouth daily. 08/20/21   Roxan Hockey, MD  aspirin EC 81 MG tablet Take 1 tablet (81 mg total) by mouth daily with breakfast. 08/20/21   Roxan Hockey, MD  hydrOXYzine (VISTARIL) 100 MG capsule Take 100 mg by mouth daily as needed for anxiety. 11/23/20   [provider]  levothyroxine (SYNTHROID) 25 MCG tablet Take 1 tablet (25 mcg total) by mouth daily. 02/02/21   Brita Romp, NP  lidocaine-prilocaine (EMLA) cream Apply 1 application topically every Monday, Wednesday, and Friday. Prior to dialysis    [provider]  loperamide (IMODIUM A-D) 2 MG tablet Take 2 tablets (4 mg total) by mouth daily as needed for diarrhea or loose stools. 12/17/20   Thurnell Lose, MD  metoprolol succinate (TOPROL XL) 25 MG 24  hr tablet Take 1 tablet (25 mg total) by mouth daily. 08/20/21   Roxan Hockey, MD  midodrine (PROAMATINE) 5 MG tablet Take 1 tablet (5 mg total) by mouth See admin instructions. Take 1 to 2 tablets on  Monday, Wednesday, Friday morning BEFORE dialysis 08/20/21   Roxan Hockey, MD  multivitamin (RENA-VIT) TABS tablet Take 1 tablet by mouth daily.    [provider]  nitroGLYCERIN (NITROSTAT) 0.4 MG SL tablet Place 1 tablet (0.4 mg total) under the tongue every 5 (five) minutes x 3 doses as needed for chest pain (if no relief after 2nd dose, proceed to the ED for an evaluation or call 911). 02/19/21   Verta Ellen., NP  omeprazole (PRILOSEC) 20 MG capsule Take 1 capsule (20 mg total) by mouth daily. 12/01/20   Rehman, Mechele Dawley, MD  rosuvastatin (CRESTOR) 10 MG tablet Take 1 tablet (10 mg total) by mouth daily. 05/13/21   Strader, Fransisco Hertz, PA-C  sevelamer carbonate (RENVELA) 800 MG tablet Take 3 tablets (2,400 mg total) by mouth 3 (three) times daily with meals. 08/20/21   Roxan Hockey, MD      Allergies    Amlodipine, Aspirin, Nitrofurantoin, Penicillins, Bactrim [sulfamethoxazole-trimethoprim], Contrast media [iodinated contrast media], Iron, Tylenol [acetaminophen], Gabapentin, Iron sucrose, Ranexa [ranolazine], Sucroferric oxyhydroxide, Dexilant [dexlansoprazole], Hydralazine, Levaquin [levofloxacin in d5w], Morphine and related, Pantoprazole, Plavix [clopidogrel bisulfate], Protonix [pantoprazole sodium], and Venofer [ferric oxide]    Review of Systems   Review of  Systems  Constitutional:  Negative for appetite change and fatigue.  HENT:  Negative for congestion, ear discharge and sinus pressure.   Eyes:  Negative for discharge.  Respiratory:  Negative for cough.   Cardiovascular:  Positive for chest pain.  Gastrointestinal:  Negative for abdominal pain and diarrhea.  Genitourinary:  Negative for frequency and hematuria.  Musculoskeletal:  Positive for extremity  weakness. Negative for back pain.  Skin:  Negative for rash.  Neurological:  Negative for seizures and headaches.  Psychiatric/Behavioral:  Negative for hallucinations.    Physical Exam Updated Vital Signs BP (!) 183/57    Pulse 64    Temp 98 F (36.7 C) (Oral)    Resp 17    Ht 5\' 1"  (1.549 m)    Wt 55.8 kg    SpO2 100%    BMI 23.24 kg/m  Physical Exam Vitals and nursing note reviewed.  Constitutional:      Appearance: She is well-developed.  HENT:     Head: Normocephalic.     Nose: Nose normal.  Eyes:     General: No scleral icterus.    Conjunctiva/sclera: Conjunctivae normal.  Neck:     Thyroid: No thyromegaly.  Cardiovascular:     Rate and Rhythm: Normal rate and regular rhythm.     Heart sounds: No murmur heard.   No friction rub. No gallop.  Pulmonary:     Breath sounds: No stridor. No wheezing or rales.  Chest:     Chest wall: No tenderness.  Abdominal:     General: There is no distension.     Tenderness: There is no abdominal tenderness. There is no rebound.  Genitourinary:    Comments: Brown stool heme-negative Musculoskeletal:        General: Normal range of motion.     Cervical back: Neck supple.  Lymphadenopathy:     Cervical: No cervical adenopathy.  Skin:    Findings: No erythema or rash.  Neurological:     Mental Status: She is alert and oriented to person, place, and time.     Motor: No abnormal muscle tone.     Coordination: Coordination normal.  Psychiatric:        Behavior: Behavior normal.    ED Results / Procedures / Treatments   Labs (all labs ordered are listed, but only abnormal results are displayed) Labs Reviewed  COMPREHENSIVE METABOLIC PANEL - Abnormal; Notable for the following components:      Result Value   Glucose, Bld 101 (*)    BUN 39 (*)    Creatinine, Ser 6.44 (*)    AST 6 (*)    Alkaline Phosphatase 140 (*)    GFR, Estimated 6 (*)    All other components within normal limits  CBC - Abnormal; Notable for the following  components:   RBC 1.83 (*)    Hemoglobin 6.6 (*)    HCT 22.0 (*)    MCV 120.2 (*)    MCH 36.1 (*)    RDW 17.2 (*)    All other components within normal limits  OCCULT BLOOD X 1 CARD TO LAB, STOOL  POC OCCULT BLOOD, ED  TYPE AND SCREEN  PREPARE RBC (CROSSMATCH)    EKG None  Radiology No results found.  Procedures Procedures    Medications Ordered in ED Medications  famotidine (PEPCID) IVPB 20 mg premix (has no administration in time range)  0.9 %  sodium chloride infusion (has no administration in time range)  sodium chloride 0.9 % bolus  1,000 mL (1,000 mLs Intravenous New Bag/Given 09/09/21 1853)    ED Course/ Medical Decision Making/ A&P                           Medical Decision Making Amount and/or Complexity of Data Reviewed Labs: ordered. ECG/medicine tests: ordered.  Risk Prescription drug management. Decision regarding hospitalization.  This patient presents to the ED for concern of weakness, this involves an extensive number of treatment options, and is a complaint that carries with it a high risk of complications and morbidity.  The differential diagnosis includes anemia, worsening renal disease   Co morbidities that complicate the patient evaluation  Renal disease with dialysis   Additional history obtained:  Additional history obtained from relative External records from outside source obtained and reviewed including hospital record   Lab Tests:  I Ordered, and personally interpreted labs.  The pertinent results include: CBC that showed hemoglobin 6.6, chemistries show renal failure   Imaging Studies ordered:  I ordered imaging studies including chest x-ray I independently visualized and interpreted imaging which showed congestive heart failure I agree with the radiologist interpretation   Cardiac Monitoring:  The patient was maintained on a cardiac monitor.  I personally viewed and interpreted the cardiac monitored which showed an  underlying rhythm of: Normal sinus rhythm   Medicines ordered and prescription drug management:  I ordered medication including transfused blood Reevaluation of the patient after these medicines showed that the patient improved I have reviewed the patients home medicines and have made adjustments as needed   Test Considered:  None   Critical Interventions:  Transfused 2 units packed red blood cells   Consultations Obtained:  I requested consultation with the hospital,  and discussed lab and imaging findings as well as pertinent plan - they recommend: Admission and transfuse packed red cells   Problem List / ED Course:  Anemia and dialysis patient    Social Determinants of Health:  None       Patient with anemia and rectal exam negative.  She will be admitted  and transfusion of 2 units        Final Clinical Impression(s) / ED Diagnoses Final diagnoses:  Symptomatic anemia    Rx / DC Orders ED Discharge Orders     None         Milton Ferguson, MD 09/11/21 [phone]

## 2021-09-09 NOTE — Assessment & Plan Note (Addendum)
-  Patient complains of generalized weakness and fatigue ?-Hgb 6.6 at time of admission; baseline 8-9. ?-FOBT negative ?-Most likely anemia of chronic disease ?-2 units PRBCs has been transfused during this hospitalization ?-Patient to have follow-up with gastroenterology as previously scheduled. ?-IV iron and Epogen therapy as per nephrology discretion. ?-Continue to follow hemoglobin trend. ? ?

## 2021-09-09 NOTE — Assessment & Plan Note (Addendum)
-  Outpatient schedule HD MWF ?-Receive hemodialysis while inpatient ?-Continue to follow renal diet and Renvela ?-Continue the use of midodrine on dialysis date to stabilize blood pressure and facilitate ultrafiltration. ?-Continue outpatient follow-up with nephrology service ?-Next hemodialysis treatment 09/13/2021. ? ?

## 2021-09-09 NOTE — Assessment & Plan Note (Addendum)
-  Overall stable ?-Continue current antihypertensive regimen ?-Heart healthy diet discussed with patient. ?

## 2021-09-09 NOTE — Assessment & Plan Note (Addendum)
-  Continue crestor ?-Follow LFTs and lipid panel as an outpatient. ?

## 2021-09-09 NOTE — Assessment & Plan Note (Addendum)
-  Currently chest pain-free and after transfusion no complaining of shortness of breath. ?-Continue heart regimen with the use of beta-blocker, baby aspirin, statins and amiodarone. ?-Heart healthy diet encouraged. ?-Continue to watch sodium intake, check weight on daily basis and continue volume management with dialysis. ?-Condition compensated. ?

## 2021-09-09 NOTE — Assessment & Plan Note (Addendum)
Continue synthroid.

## 2021-09-09 NOTE — Assessment & Plan Note (Deleted)
Continue beta blocker. 

## 2021-09-09 NOTE — Assessment & Plan Note (Addendum)
-  Continue amiodarone and metoprolol ?-No major abnormalities appreciated on telemetry; patient denies palpitations. ?-Continue outpatient follow-up with cardiology service. ?

## 2021-09-09 NOTE — Assessment & Plan Note (Addendum)
-  Most likely secondary to anemia ?-TSH within normal limits ?-Physical therapy has seen patient with recommendation for home health PT at discharge. ?

## 2021-09-09 NOTE — ED Triage Notes (Signed)
Sent by Dr Prudencio Burly for blood transfusion due to hgb being 6 Reports one dark stool last night but had liver for dinner ?

## 2021-09-09 NOTE — H&P (Signed)
History and Physical    Patient: Shawna [name] CWC:[id number] DOB: 17-Jul-1940 DOA: 09/09/2021 DOS: the patient was seen and examined on 09/09/2021 PCP: Practice, [facility]  Patient coming from: Home  Chief Complaint:  Chief Complaint  Patient presents with   Abnormal Lab    HPI: Shawna [name] is a 81 y.o. female with history of chronic combined congestive heart failure, anxiety, coronary artery disease, chronic anemia, ESRD on hemodialysis, GERD, hyperlipidemia, hypertension, iron deficiency anemia, paroxysmal atrial fibrillation, and more presents ED with chief complaint of anemia.  Patient reports that she was feeling weak and fatigued so her outpatient doctor did lab work and found her to have a hemoglobin in the 6's.  Patient reports that her weakness and fatigue started 1 week ago.  She is also had chest pain more often.  Her last nitro use was day before yesterday.  She reports the chest pain feels like a pressure.  No radiation.  Relieved with nitro.  She denies shortness of breath, but admits to paresthesias in her feet.  She is also had a headache yesterday that felt like a sharp pain in her bilateral temples.  She has had dizziness upon standing as well.  She reports dark green stools, but no black stools.  She was FOBT negative in the ER.  She denies any vomiting.  Patient does report that she was supposed to have a colonoscopy around Christmas, but did not have 1 because he cannot get an IV.  That was a follow-up for polyps.  Patient denies any bruising, epistaxis, bleeding from her gums.  Patient has no other complaints at this time.   Patient does not smoke, does not drink alcohol, does not use illicit drugs.  Patient is vaccinated for COVID.  Patient is full code. Review of Systems: As mentioned in the history of present illness. All other systems reviewed and are negative. Past Medical History:  Diagnosis Date   Acute on chronic respiratory failure with hypoxia ([place]) 10/10/2016    Anxiety    Arthritis    AVM (arteriovenous malformation) of colon    CAD (coronary artery disease)    a. s/p CABG in 2013 b. DES to D1 in 10/2016. c. cath in 07/2018 showing patent grafts with occlusion of D1 at prior stent site and progression of PDA disease --> medical management recommended   Carotid artery disease ([place])    a. 76-16% LICA, 0/7371    Chronic anemia    Chronic bronchitis (HCC)    Chronic diastolic CHF (congestive heart failure) ([place])    a. 02/2012 Echo EF 60-65%, nl wall motion, Gr 1 DD, mod MR   Colon cancer ([place]) 1992   Esophageal stricture    ESRD on hemodialysis ([place])    ESRD due to HTN, started dialysis 2011 and gets HD at [facility] with Dr Hinda Lenis on MWF schedule.  Access is LUA AVF as of Sept 2014.    GERD (gastroesophageal reflux disease)    High cholesterol 12/2011   History of blood transfusion 07/2011; 12/2011; 01/2012 X 2; 04/2012   History of gout    History of lower GI bleeding    Hypertension    Iron deficiency anemia    Jugular vein occlusion, right (HCC)    Mitral regurgitation    a. Moderate by echo, 02/2012   Myocardial infarction [facility])    NSVT (nonsustained ventricular tachycardia)    Ovarian cancer ([place]) 1992   PAF (paroxysmal atrial fibrillation) ([place])  Pneumonia ~ 2009   PUD (peptic ulcer disease)    TIA (transient ischemic attack)    Past Surgical History:  Procedure Laterality Date   A/V SHUNTOGRAM Left 03/19/2019   Procedure: A/V SHUNTOGRAM;  Surgeon: Katha Cabal, MD;  Location: [place] [facility];  Service: Cardiovascular;  Laterality: Left;   ABDOMINAL HYSTERECTOMY  1992   APPENDECTOMY  06/1990   AV FISTULA PLACEMENT  07/2009   left upper arm   AV FISTULA PLACEMENT Right 09/06/2016   Procedure: RIGHT FOREARM ARTERIOVENOUS (AV) GRAFT;  Surgeon: Elam Dutch, MD;  Location: [place];  Service: Vascular;  Laterality: Right;   AV FISTULA PLACEMENT N/A 02/24/2017   Procedure: INSERTION OF ARTERIOVENOUS (AV) GORE-TEX GRAFT ARM  (BRACHIAL AXILLARY);  Surgeon: Katha Cabal, MD;  Location: [facility];  Service: Vascular;  Laterality: N/A;   [place] Right 09/06/2016   Procedure: REMOVAL OF Right Arm ARTERIOVENOUS GORETEX GRAFT and Vein Patch angioplasty of brachial artery;  Surgeon: Angelia Mould, MD;  Location: [place];  Service: Vascular;  Laterality: Right;   BIOPSY  09/26/2019   Procedure: BIOPSY;  Surgeon: Rogene Houston, MD;  [facility];  Service: Endoscopy;;   COLON RESECTION  1992   COLON SURGERY     COLONOSCOPY N/A 03/09/2019   Procedure: COLONOSCOPY;  Surgeon: Rogene Houston, MD;  [facility];  Service: Endoscopy;  Laterality: N/A;   COLONOSCOPY WITH PROPOFOL N/A 06/08/2021   Procedure: COLONOSCOPY WITH PROPOFOL;  Surgeon: Harvel Quale, MD;  [facility];  Service: Gastroenterology;  Laterality: N/A;  9:05 /Patient is on dialysis Mon Wed Fri   CORONARY ANGIOPLASTY WITH STENT PLACEMENT  12/15/11   "2"   CORONARY ANGIOPLASTY WITH STENT PLACEMENT  y/2013   "1; makes total of 3" (05/02/2012)   CORONARY ARTERY BYPASS GRAFT  06/13/2012   Procedure: CORONARY ARTERY BYPASS GRAFTING (CABG);  Surgeon: Grace Isaac, MD;  Location: [place];  Service: Open Heart Surgery;  Laterality: N/A;  cabg x four;  using left internal mammary artery, and left leg greater saphenous vein harvested endoscopically   CORONARY STENT INTERVENTION N/A 10/13/2016   Procedure: Coronary Stent Intervention;  Surgeon: Troy Sine, MD;  Location: [place] [facility];  Service: Cardiovascular;  Laterality: N/A;   DIALYSIS/PERMA CATHETER REMOVAL N/A 04/18/2017   Procedure: DIALYSIS/PERMA CATHETER REMOVAL;  Surgeon: Katha Cabal, MD;  Location: [place] [facility];  Service: Cardiovascular;  Laterality: N/A;   DILATION AND CURETTAGE OF UTERUS     ENTEROSCOPY N/A 06/08/2021   Procedure: PUSH ENTEROSCOPY;  Surgeon: Harvel Quale, MD;  [facility];  Service:  Gastroenterology;  Laterality: N/A;   ESOPHAGOGASTRODUODENOSCOPY  01/20/2012   Procedure: ESOPHAGOGASTRODUODENOSCOPY (EGD);  Surgeon: Ladene Artist, MD,FACG;  Location: [facility] ENDOSCOPY;  Service: Endoscopy;  Laterality: N/A;   ESOPHAGOGASTRODUODENOSCOPY N/A 03/26/2013   Procedure: ESOPHAGOGASTRODUODENOSCOPY (EGD);  Surgeon: Irene Shipper, MD;  Location: [facility] ENDOSCOPY;  Service: Endoscopy;  Laterality: N/A;   ESOPHAGOGASTRODUODENOSCOPY N/A 04/30/2015   Procedure: ESOPHAGOGASTRODUODENOSCOPY (EGD);  Surgeon: Rogene Houston, MD;  [facility];  Service: Endoscopy;  Laterality: N/A;  1pm - moved to 10/20 @ 1:10   ESOPHAGOGASTRODUODENOSCOPY N/A 07/29/2016   Procedure: ESOPHAGOGASTRODUODENOSCOPY (EGD);  Surgeon: Manus Gunning, MD;  Location: [place];  Service: Gastroenterology;  Laterality: N/A;  enteroscopy   ESOPHAGOGASTRODUODENOSCOPY N/A 09/26/2019   Procedure: ESOPHAGOGASTRODUODENOSCOPY (EGD);  Surgeon: Rogene Houston, MD;  [facility];  Service: Endoscopy;  Laterality: N/A;  1250   ESOPHAGOGASTRODUODENOSCOPY (EGD) WITH PROPOFOL N/A 02/05/2021   Procedure: ESOPHAGOGASTRODUODENOSCOPY (EGD) WITH PROPOFOL;  Surgeon: Eloise Harman, DO;  [facility];  Service: Endoscopy;  Laterality: N/A;   GIVENS CAPSULE STUDY N/A 03/07/2019   Procedure: GIVENS CAPSULE STUDY;  Surgeon: Rogene Houston, MD;  [facility];  Service: Endoscopy;  Laterality: N/A;  7:30   GIVENS CAPSULE STUDY N/A 04/22/2021   Procedure: GIVENS CAPSULE STUDY;  Surgeon: Rogene Houston, MD;  [facility];  Service: Endoscopy;  Laterality: N/A;  7:30   INSERTION OF DIALYSIS CATHETER N/A 10/05/2020   Procedure: ABORTED TUNNELED DIALYSIS CATHETER PLACEMENT RIGHT INTERNAL JUGULAR VEIN ;  Surgeon: Virl Cagey, MD;  [facility];  Service: General;  Laterality: N/A;   INTRAOPERATIVE TRANSESOPHAGEAL ECHOCARDIOGRAM  06/13/2012   Procedure: INTRAOPERATIVE TRANSESOPHAGEAL  ECHOCARDIOGRAM;  Surgeon: Grace Isaac, MD;  Location: [place];  Service: Open Heart Surgery;  Laterality: N/A;   IR DIALY SHUNT INTRO NEEDLE/INTRACATH INITIAL W/IMG LEFT Left 10/06/2020   IR FLUORO GUIDE CV LINE RIGHT  06/17/2020   IR GENERIC HISTORICAL  07/26/2016   IR FLUORO GUIDE CV LINE RIGHT 07/26/2016 Greggory Keen, MD [facility]   IR GENERIC HISTORICAL  07/26/2016   IR US GUIDE VASC ACCESS RIGHT 07/26/2016 Greggory Keen, MD [facility]   IR GENERIC HISTORICAL  08/02/2016   IR US GUIDE VASC ACCESS RIGHT 08/02/2016 Greggory Keen, MD [facility]   IR GENERIC HISTORICAL  08/02/2016   IR FLUORO GUIDE CV LINE RIGHT 08/02/2016 Greggory Keen, MD [facility]   IR RADIOLOGY PERIPHERAL GUIDED IV START  03/28/2017   IR REMOVAL TUN CV CATH W/O FL  08/11/2020   IR THROMBECTOMY AV FISTULA W/THROMBOLYSIS INC/SHUNT/IMG LEFT Left 06/17/2020   IR US GUIDE VASC ACCESS LEFT  06/17/2020   IR US GUIDE VASC ACCESS RIGHT  03/28/2017   IR US GUIDE VASC ACCESS RIGHT  06/17/2020   LEFT HEART CATH AND CORONARY ANGIOGRAPHY N/A 09/20/2016   Procedure: Left Heart Cath and Coronary Angiography;  Surgeon: [name], MD;  Location: [place] [facility];  Service: Cardiovascular;  Laterality: N/A;   LEFT HEART CATH AND CORS/GRAFTS ANGIOGRAPHY N/A 10/13/2016   Procedure: Left Heart Cath and Cors/Grafts Angiography;  Surgeon: Troy Sine, MD;  Location: [place] [facility];  Service: Cardiovascular;  Laterality: N/A;   LEFT HEART CATH AND CORS/GRAFTS ANGIOGRAPHY N/A 07/13/2018   Procedure: LEFT HEART CATH AND CORS/GRAFTS ANGIOGRAPHY;  Surgeon: Martinique, Peter M, MD;  Location: [place] [facility];  Service: Cardiovascular;  Laterality: N/A;   LEFT HEART CATH AND CORS/GRAFTS ANGIOGRAPHY N/A 07/22/2021   Procedure: LEFT HEART CATH AND CORS/GRAFTS ANGIOGRAPHY;  Surgeon: Lorretta Harp, MD;  Location: [place] [facility];  Service: Cardiovascular;  Laterality: N/A;   LEFT HEART CATHETERIZATION WITH CORONARY ANGIOGRAM N/A 12/15/2011    Procedure: LEFT HEART CATHETERIZATION WITH CORONARY ANGIOGRAM;  Surgeon: Burnell Blanks, MD;  Location: [facility] CATH LAB;  Service: Cardiovascular;  Laterality: N/A;   LEFT HEART CATHETERIZATION WITH CORONARY ANGIOGRAM N/A 01/10/2012   Procedure: LEFT HEART CATHETERIZATION WITH CORONARY ANGIOGRAM;  Surgeon: Peter M Martinique, MD;  Location: [facility] CATH LAB;  Service: Cardiovascular;  Laterality: N/A;   LEFT HEART CATHETERIZATION WITH CORONARY ANGIOGRAM N/A 06/08/2012   Procedure: LEFT HEART CATHETERIZATION WITH CORONARY ANGIOGRAM;  Surgeon: Burnell Blanks, MD;  Location: [facility] CATH LAB;  Service: Cardiovascular;  Laterality: N/A;   LEFT  HEART CATHETERIZATION WITH CORONARY/GRAFT ANGIOGRAM N/A 12/10/2013   Procedure: LEFT HEART CATHETERIZATION WITH Beatrix Fetters;  Surgeon: [name], MD;  Location: [facility] CATH LAB;  Service: Cardiovascular;  Laterality: N/A;   OVARY SURGERY     ovarian cancer   POLYPECTOMY  03/09/2019   Procedure: POLYPECTOMY;  Surgeon: Rogene Houston, MD;  [facility];  Service: Endoscopy;;  cecal    POLYPECTOMY N/A 09/26/2019   Procedure: DUODENAL POLYPECTOMY;  Surgeon: Rogene Houston, MD;  [facility];  Service: Endoscopy;  Laterality: N/A;   REVISION OF ARTERIOVENOUS GORETEX GRAFT N/A 02/24/2017   Procedure: REVISION OF ARTERIOVENOUS GORETEX GRAFT (RESECTION);  Surgeon: Katha Cabal, MD;  Location: [facility];  Service: Vascular;  Laterality: N/A;   REVISON OF ARTERIOVENOUS FISTULA Left 06/19/2020   Procedure: REVISION OF LEFT UPPER ARM AV GRAFT WITH INTERPOSITION JUMP GRAFT USING 6MM GORE LIMB;  Surgeon: Marty Heck, MD;  Location: [place];  Service: Vascular;  Laterality: Left;   SHUNTOGRAM N/A 10/15/2013   Procedure: Fistulogram;  Surgeon: Serafina Mitchell, MD;  Location: [facility] CATH LAB;  Service: Cardiovascular;  Laterality: N/A;   THROMBECTOMY / ARTERIOVENOUS GRAFT REVISION  2011   left upper arm   TUBAL LIGATION  1980's    UPPER EXTREMITY ANGIOGRAPHY Bilateral 12/06/2016   Procedure: Upper Extremity Angiography;  Surgeon: Katha Cabal, MD;  Location: [place] [facility];  Service: Cardiovascular;  Laterality: Bilateral;   UPPER EXTREMITY INTERVENTION Left 06/06/2017   Procedure: UPPER EXTREMITY INTERVENTION;  Surgeon: Katha Cabal, MD;  Location: [place] [facility];  Service: Cardiovascular;  Laterality: Left;   Social History:  reports that she has never smoked. She has never used smokeless tobacco. She reports that she does not drink alcohol and does not use drugs.  Allergies  Allergen Reactions   Amlodipine Swelling   Aspirin Other (See Comments)    High Doses Mess up her stomach; "makes my bowels have blood in them". Takes 81 mg EC Aspirin    Nitrofurantoin Hives   Penicillins Other (See Comments)    SYNCOPE? , "makes me real weak when I take it; like I'll pass out"  Has patient had a PCN reaction causing immediate rash, facial/tongue/throat swelling, SOB or lightheadedness with hypotension: Yes Has patient had a PCN reaction causing severe rash involving mucus membranes or skin necrosis: no Has patient had a PCN reaction that required hospitalization no Has patient had a PCN reaction occurring within the last 10 years: no If all of the above    Bactrim [Sulfamethoxazole-Trimethoprim] Rash   Contrast Media [Iodinated Contrast Media] Itching   Iron Itching and Other (See Comments)    "they gave me iron in dialysis; had to give me Benadryl cause I had to have the iron" (05/02/2012)   Tylenol [Acetaminophen] Itching and Other (See Comments)    Makes her feet on fire per pt   Gabapentin Other (See Comments)    Unknown reaction   Iron Sucrose    Ranexa [Ranolazine] Other (See Comments)    Myoclonus-hospitalized    Sucroferric Oxyhydroxide    Dexilant [Dexlansoprazole] Other (See Comments)    Upset stomach   Hydralazine Itching    Has tolerated while inpatient Has tolerated while  inpatient   Levaquin [Levofloxacin In D5w] Rash   Morphine And Related Itching and Other (See Comments)    Itching in feet   Pantoprazole Rash   Plavix [Clopidogrel Bisulfate] Rash   Protonix [Pantoprazole Sodium] Rash  Venofer [Ferric Oxide] Itching and Other (See Comments)    Patient reports using Benadryl prior to doses as [place]    Family History  Problem Relation Age of Onset   Heart disease Mother        Heart Disease before age 81   Hyperlipidemia Mother    Hypertension Mother    Diabetes Mother    Heart attack Mother    Heart disease Father        Heart Disease before age 42   Hyperlipidemia Father    Hypertension Father    Diabetes Father    Diabetes Sister    Hypertension Sister    Diabetes Brother    Hyperlipidemia Brother    Heart attack Brother    Hypertension Sister    Heart attack Brother    Colon cancer Child 49   Other Other        noncontributory for early CAD   Esophageal cancer Neg Hx    Liver disease Neg Hx    Kidney disease Neg Hx    Colon polyps Neg Hx     Prior to Admission medications   Medication Sig Start Date End Date Taking? Authorizing Provider  ALPRAZolam Duanne Moron) 0.5 MG tablet Take 0.5 mg by mouth at bedtime. 03/20/21   [provider]  amiodarone (PACERONE) 100 MG tablet Take 1 tablet (100 mg total) by mouth daily. 08/20/21   Roxan Hockey, MD  aspirin EC 81 MG tablet Take 1 tablet (81 mg total) by mouth daily with breakfast. 08/20/21   Roxan Hockey, MD  hydrOXYzine (VISTARIL) 100 MG capsule Take 100 mg by mouth daily as needed for anxiety. 11/23/20   [provider]  levothyroxine (SYNTHROID) 25 MCG tablet Take 1 tablet (25 mcg total) by mouth daily. 02/02/21   Brita Romp, NP  lidocaine-prilocaine (EMLA) cream Apply 1 application topically every Monday, Wednesday, and Friday. Prior to dialysis    [provider]  loperamide (IMODIUM A-D) 2 MG tablet Take 2 tablets (4 mg total) by mouth daily  as needed for diarrhea or loose stools. 12/17/20   Thurnell Lose, MD  metoprolol succinate (TOPROL XL) 25 MG 24 hr tablet Take 1 tablet (25 mg total) by mouth daily. 08/20/21   Roxan Hockey, MD  midodrine (PROAMATINE) 5 MG tablet Take 1 tablet (5 mg total) by mouth See admin instructions. Take 1 to 2 tablets on  Monday, Wednesday, Friday morning BEFORE dialysis 08/20/21   Roxan Hockey, MD  multivitamin (RENA-VIT) TABS tablet Take 1 tablet by mouth daily.    [provider]  nitroGLYCERIN (NITROSTAT) 0.4 MG SL tablet Place 1 tablet (0.4 mg total) under the tongue every 5 (five) minutes x 3 doses as needed for chest pain (if no relief after 2nd dose, proceed to the ED for an evaluation or call 911). 02/19/21   Verta Ellen., NP  omeprazole (PRILOSEC) 20 MG capsule Take 1 capsule (20 mg total) by mouth daily. 12/01/20   Rehman, Mechele Dawley, MD  rosuvastatin (CRESTOR) 10 MG tablet Take 1 tablet (10 mg total) by mouth daily. 05/13/21   Strader, Fransisco Hertz, PA-C  sevelamer carbonate (RENVELA) 800 MG tablet Take 3 tablets (2,400 mg total) by mouth 3 (three) times daily with meals. 08/20/21   Roxan Hockey, MD    Physical Exam: Vitals:   09/09/21 1815 09/09/21 1830 09/09/21 1845 09/09/21 1900  BP:    (!) 183/57  Pulse: 60   64  Resp: Marland Kitchen)  23 15 19 17   Temp:    98 F (36.7 C)  TempSrc:    Oral  SpO2: 100%   100%  Weight:      Height:       1.  General: Patient lying supine in bed,  no acute distress   2. Psychiatric: Alert and oriented x 3, mood and behavior normal for situation, pleasant and cooperative with exam   3. Neurologic: Speech and language are normal, face is symmetric, moves all 4 extremities voluntarily, at baseline without acute deficits on limited exam   4. HEENMT:  Head is atraumatic, normocephalic, pupils reactive to light, neck is supple, trachea is midline, mucous membranes are moist   5. Respiratory : Lungs are clear to auscultation bilaterally without  wheezing, rhonchi, rales, no cyanosis, no increase in work of breathing or accessory muscle use   6. Cardiovascular : Heart rate normal, rhythm is regular, murmur present, rubs or gallops, no peripheral edema, peripheral pulses palpated   7. Gastrointestinal:  Abdomen is soft, nondistended, nontender to palpation bowel sounds active, no masses or organomegaly palpated   8. Skin:  Skin is warm, dry and intact without rashes, acute lesions, or ulcers on limited exam   9.Musculoskeletal:  No acute deformities or trauma, no asymmetry in tone, no peripheral edema, peripheral pulses palpated, no tenderness to palpation in the extremities   Data Reviewed: In the ED Temp 98-90 8.2, heart rate 60-65, respiratory rate 15-23, blood pressure 161/41-183/57, satting 97-100% No leukocytosis, anemia at 6.6 Chemistry panel is unremarkable aside from BUN and creatinine elevated at 39 and 6.44-patient is ESRD FOBT negative Transfused 2 units ordered Pepcid IV in the ED 1 L normal saline given Admission requested for symptomatic anemia  Assessment and Plan: Symptomatic anemia Patient complains of generalized weakness and fatigue Hgb 6.6, baseline 8-9. Feb 20, already trending down to 7.6 FOBT negative Most likely anemia of chronic disease Patient also has known B12 deficiency Transfuse 2 units - ordered by the ED Trend in the AM   Paroxysmal Atrial fibrillation/Flutter Continue amiodarone and metoprolol Currently in SR Admission EKG pending Monitor on tele  Hypothyroidism (acquired) Continue synthroid Given generalized weakness and fatigue, will check TSH  Generalized weakness Most likely secondary to anemia Hypothyroidism may be a contributor - check TSH PT eval and treat Continue to monitor  Hypertension BP at admission = 187/57 RN to confirm with manual Patient asymptomatic - given low diastolic, will hold off on extra anti-hypertensives at this time  ESRD (end stage renal  disease) on dialysis ([place]) HD MWF Last dialyzed 09/08/21 Renal diet Continue [place] Consult nephro   Chronic combined systolic and diastolic CHF (congestive heart failure) (HCC) Continue beta blocker Continue aspirin Continue crestor Continue to montiro  GERD (gastroesophageal reflux disease) Continue PPI Pepcid given in the ED  HLD (hyperlipidemia) Continue crestor       Advance Care Planning:   Code Status: Prior full  Consults: Nephrology  Family Communication: Husband at bedside  Severity of Illness: The appropriate patient status for this patient is OBSERVATION. Observation status is judged to be reasonable and necessary in order to provide the required intensity of service to ensure the patient's safety. The patient's presenting symptoms, physical exam findings, and initial radiographic and laboratory data in the context of their medical condition is felt to place them at decreased risk for further clinical deterioration. Furthermore, it is anticipated that the patient will be medically stable for discharge from the hospital within 2 midnights  of admission.   Author: Rolla Plate, DO 09/09/2021 7:45 PM  For on call review [email or web address].

## 2021-09-09 NOTE — Assessment & Plan Note (Addendum)
-  Continue PPI ?-Lifestyle changes discussed with patient ?-Continue outpatient follow-up with gastroenterology service. ?

## 2021-09-10 DIAGNOSIS — R0600 Dyspnea, unspecified: Secondary | ICD-10-CM | POA: Diagnosis not present

## 2021-09-10 DIAGNOSIS — I480 Paroxysmal atrial fibrillation: Secondary | ICD-10-CM | POA: Diagnosis not present

## 2021-09-10 DIAGNOSIS — I5042 Chronic combined systolic (congestive) and diastolic (congestive) heart failure: Secondary | ICD-10-CM | POA: Diagnosis not present

## 2021-09-10 DIAGNOSIS — D649 Anemia, unspecified: Secondary | ICD-10-CM | POA: Diagnosis not present

## 2021-09-10 LAB — COMPREHENSIVE METABOLIC PANEL
ALT: 13 U/L (ref 0–44)
AST: 10 U/L — ABNORMAL LOW (ref 15–41)
Albumin: 3.4 g/dL — ABNORMAL LOW (ref 3.5–5.0)
Alkaline Phosphatase: 134 U/L — ABNORMAL HIGH (ref 38–126)
Anion gap: 12 (ref 5–15)
BUN: 51 mg/dL — ABNORMAL HIGH (ref 8–23)
CO2: 24 mmol/L (ref 22–32)
Calcium: 8.9 mg/dL (ref 8.9–10.3)
Chloride: 104 mmol/L (ref 98–111)
Creatinine, Ser: 7.66 mg/dL — ABNORMAL HIGH (ref 0.44–1.00)
GFR, Estimated: 5 mL/min — ABNORMAL LOW (ref >60)
Glucose, Bld: 132 mg/dL — ABNORMAL HIGH (ref 70–99)
Potassium: 3.9 mmol/L (ref 3.5–5.1)
Sodium: 140 mmol/L (ref 135–145)
Total Bilirubin: 0.4 mg/dL (ref 0.3–1.2)
Total Protein: 6.4 g/dL — ABNORMAL LOW (ref 6.5–8.1)

## 2021-09-10 LAB — CBC WITH DIFFERENTIAL/PLATELET
Abs Immature Granulocytes: 0.02 10*3/uL (ref 0.00–0.07)
Basophils Absolute: 0.1 10*3/uL (ref 0.0–0.1)
Basophils Relative: 1 %
Eosinophils Absolute: 0.3 10*3/uL (ref 0.0–0.5)
Eosinophils Relative: 4 %
HCT: 33.1 % — ABNORMAL LOW (ref 36.0–46.0)
Hemoglobin: 10.8 g/dL — ABNORMAL LOW (ref 12.0–15.0)
Immature Granulocytes: 0 %
Lymphocytes Relative: 8 %
Lymphs Abs: 0.6 10*3/uL — ABNORMAL LOW (ref 0.7–4.0)
MCH: 33.6 pg (ref 26.0–34.0)
MCHC: 32.6 g/dL (ref 30.0–36.0)
MCV: 103.1 fL — ABNORMAL HIGH (ref 80.0–100.0)
Monocytes Absolute: 0.6 10*3/uL (ref 0.1–1.0)
Monocytes Relative: 8 %
Neutro Abs: 5.9 10*3/uL (ref 1.7–7.7)
Neutrophils Relative %: 79 %
Platelets: 151 10*3/uL (ref 150–400)
RBC: 3.21 MIL/uL — ABNORMAL LOW (ref 3.87–5.11)
RDW: 20.8 % — ABNORMAL HIGH (ref 11.5–15.5)
WBC: 7.5 10*3/uL (ref 4.0–10.5)
nRBC: 0 % (ref 0.0–0.2)

## 2021-09-10 LAB — PHOSPHORUS: Phosphorus: 4.5 mg/dL (ref 2.5–4.6)

## 2021-09-10 LAB — IRON AND TIBC
Iron: 116 ug/dL (ref 28–170)
Saturation Ratios: 42 % — ABNORMAL HIGH (ref 10.4–31.8)
TIBC: 279 ug/dL (ref 250–450)
UIBC: 163 ug/dL

## 2021-09-10 LAB — RETICULOCYTES
Immature Retic Fract: 26.3 % — ABNORMAL HIGH (ref 2.3–15.9)
RBC.: 3.29 MIL/uL — ABNORMAL LOW (ref 3.87–5.11)
Retic Count, Absolute: 66.8 10*3/uL (ref 19.0–186.0)
Retic Ct Pct: 2 % (ref 0.4–3.1)

## 2021-09-10 LAB — HEPATITIS B SURFACE ANTIBODY,QUALITATIVE: Hep B S Ab: NONREACTIVE

## 2021-09-10 LAB — MAGNESIUM: Magnesium: 1.8 mg/dL (ref 1.7–2.4)

## 2021-09-10 LAB — FOLATE: Folate: 14.5 ng/mL (ref >5.9)

## 2021-09-10 LAB — GLUCOSE, CAPILLARY
Glucose-Capillary: 68 mg/dL — ABNORMAL LOW (ref 70–99)
Glucose-Capillary: 104 mg/dL — ABNORMAL HIGH (ref 70–99)
Glucose-Capillary: 100 mg/dL — ABNORMAL HIGH (ref 70–99)

## 2021-09-10 LAB — HEPATITIS B SURFACE ANTIGEN: Hepatitis B Surface Ag: NONREACTIVE

## 2021-09-10 LAB — VITAMIN B12: Vitamin B-12: 282 pg/mL (ref 180–914)

## 2021-09-10 LAB — FERRITIN: Ferritin: 937 ng/mL — ABNORMAL HIGH (ref 11–307)

## 2021-09-10 MED ORDER — ACETAMINOPHEN 325 MG PO TABS
ORAL_TABLET | ORAL | Status: AC
  Filled 2021-09-10: qty 1

## 2021-09-10 MED ORDER — LIDOCAINE-PRILOCAINE 2.5-2.5 % EX CREA: 1.0000 "application " | TOPICAL_CREAM | CUTANEOUS | Status: DC | PRN

## 2021-09-10 MED ORDER — LIDOCAINE HCL (PF) 1 % IJ SOLN: 5.0000 mL | INTRAMUSCULAR | Status: DC | PRN

## 2021-09-10 MED ORDER — IPRATROPIUM-ALBUTEROL 0.5-2.5 (3) MG/3ML IN SOLN
3.0000 mL | RESPIRATORY_TRACT | Status: DC | PRN
  Administered 2021-09-10: 3 mL via RESPIRATORY_TRACT
  Filled 2021-09-10: qty 3

## 2021-09-10 MED ORDER — DARBEPOETIN ALFA 100 MCG/0.5ML IJ SOSY
PREFILLED_SYRINGE | INTRAMUSCULAR | Status: AC
  Administered 2021-09-10: 100 ug
  Filled 2021-09-10: qty 0.5

## 2021-09-10 MED ORDER — GLUCOSE-VITAMIN C 4-6 GM-MG PO CHEW
CHEWABLE_TABLET | ORAL | Status: AC
Start: 2021-09-10 — End: 2021-09-10
  Administered 2021-09-10: 4 g
  Filled 2021-09-10: qty 1

## 2021-09-10 MED ORDER — DARBEPOETIN ALFA 200 MCG/0.4ML IJ SOSY
PREFILLED_SYRINGE | INTRAMUSCULAR | Status: AC
  Administered 2021-09-10: 300 ug via INTRAVENOUS
  Filled 2021-09-10: qty 0.4

## 2021-09-10 MED ORDER — SODIUM CHLORIDE 0.9 % IV SOLN: 100.0000 mL | INTRAVENOUS | Status: DC | PRN

## 2021-09-10 MED ORDER — CHLORHEXIDINE GLUCONATE 0.12 % MT SOLN: 15.0000 mL | Freq: Two times a day (BID) | OROMUCOSAL | Status: DC

## 2021-09-10 MED ORDER — CHLORHEXIDINE GLUCONATE CLOTH 2 % EX PADS
6.0000 | MEDICATED_PAD | Freq: Every day | CUTANEOUS | Status: DC
  Administered 2021-09-10: 6 via TOPICAL

## 2021-09-10 MED ORDER — ORAL CARE MOUTH RINSE
15.0000 mL | Freq: Two times a day (BID) | OROMUCOSAL | Status: DC
  Administered 2021-09-10: 15 mL via OROMUCOSAL

## 2021-09-10 MED ORDER — PENTAFLUOROPROP-TETRAFLUOROETH EX AERO
1.0000 "application " | INHALATION_SPRAY | CUTANEOUS | Status: DC | PRN
  Filled 2021-09-10: qty 30

## 2021-09-10 MED ORDER — DARBEPOETIN ALFA 300 MCG/0.6ML IJ SOSY
300.0000 ug | PREFILLED_SYRINGE | INTRAMUSCULAR | Status: DC
  Filled 2021-09-10: qty 0.6

## 2021-09-10 NOTE — Procedures (Signed)
? ?  HEMODIALYSIS TREATMENT NOTE: ? ? ?3.5 hour heparin-free session completed using left upper arm AVG (16g/antegrade). Goal met: 1 liter removed.  Had one episode where SBP dropped from 180s to 120s and HR^ to 130s.  Goal was adjusted and HR returned to 70s-100s.  All blood was returned and hemostasis was achieved in 20 minutes.  No changes from pre-HD assessment. ? ? ?Rockwell Alexandria, RN ?

## 2021-09-10 NOTE — TOC Transition Note (Signed)
Transition of Care (TOC) - CM/SW Discharge Note ? ? ?Patient Details  ?Name: [name] ?MRN: [id number] ?Date of Birth: 18-Nov-1940 ? ?Transition of Care (TOC) CM/SW Contact:  ?Boneta Lucks, RN ?Phone Number: ?09/10/2021, 4:12 PM ? ? ?Clinical Narrative:  Patient admitted in OBS for symptomatic anemia.  Patient discharging home after dialysis. PT recommending HHPT.  Patient agreeable. Sarah with [facility] accepted and will send to their [place].  ? ?Final next level of care: [place] ?Barriers to Discharge: Barriers Resolved ? ? ?Patient Goals and CMS Choice ?Patient states their goals for this hospitalization and ongoing recovery are:: to go home. ?CMS Medicare.gov Compare Post Acute Care list provided to:: Patient ?Choice offered to / list presented to : Patient ? ?Discharge Placement ?  ?       ?Patient and family notified of of transfer: 09/10/21 ? ?Discharge Plan and Services ?  ?   ?HH Arranged: PT ?  ?Date HH Agency Contacted: 09/10/21 ?Time [place]: [id number] ?Representative spoke with at [place]: Billey Chang ? ? ?Readmission Risk Interventions ?Readmission Risk Prevention Plan 07/23/2021 11/11/2020 10/04/2020  ?Transportation Screening Complete Complete Complete  ?PCP or Specialist Appt within 3-5 Days - - -  ?[place] or [place] - - -  ?Social Work Consult for [place] Planning/Counseling - - -  ?Palliative Care Screening - - -  ?Medication Review Press photographer) Complete Complete Complete  ?PCP or Specialist appointment within 3-5 days of discharge Complete Complete Complete  ?[place] or Home Care Consult Complete Complete Complete  ?SW Recovery Care/Counseling Consult Complete Complete Complete  ?Palliative Care Screening Not Applicable Not Applicable Not Applicable  ?Skilled Nursing Facility Not Applicable Not Applicable Complete  ?Some recent data might be hidden  ? ? ? ? ? ?

## 2021-09-10 NOTE — Progress Notes (Signed)
Discharge instructions given and verbalized understanding, discharge home via w/c with staff in stable condition. ?

## 2021-09-10 NOTE — Consult Note (Addendum)
Reason for Consult: To manage dialysis and dialysis related needs Referring Physician: Dyann Kief  Shawna [name] is an 81 y.o. female with ESRD, systolic and diastolic CHF, atrial fibrillation, ESRD, CABG, diabetes mellitus-  frequent hospitalizations- most recently discharged on 08/20/21 after hosp for CP and atrial fib.  She now presents with dizziness and near syncope-  found to have hgb of 6.6 ( was 7.6 on 2/20) -  she is being admitted for transfusion.  Today is her dialysis day.  She feels better after transfusion     Dialysis Orders:  [facility] - MWF-  3.5 hours  EDW 54. HD Bath 2k/2.5 ca, Cellent 17H KOA, Heparin none. Access LUE AVG BFR 300, DFR 500- 15 guage. Micera 200 mcg IV every 2 weeks  Past Medical History:  Diagnosis Date   Acute on chronic respiratory failure with hypoxia (HCC) 10/10/2016   Anxiety    Arthritis    AVM (arteriovenous malformation) of colon    CAD (coronary artery disease)    a. s/p CABG in 2013 b. DES to D1 in 10/2016. c. cath in 07/2018 showing patent grafts with occlusion of D1 at prior stent site and progression of PDA disease --> medical management recommended   Carotid artery disease ([place])    a. 27-06% LICA, 08/3760    Chronic anemia    Chronic bronchitis (HCC)    Chronic diastolic CHF (congestive heart failure) ([place])    a. 02/2012 Echo EF 60-65%, nl wall motion, Gr 1 DD, mod MR   Colon cancer ([place]) 1992   Esophageal stricture    ESRD on hemodialysis ([place])    ESRD due to HTN, started dialysis 2011 and gets HD at [facility] with Dr Hinda Lenis on MWF schedule.  Access is LUA AVF as of Sept 2014.    GERD (gastroesophageal reflux disease)    High cholesterol 12/2011   History of blood transfusion 07/2011; 12/2011; 01/2012 X 2; 04/2012   History of gout    History of lower GI bleeding    Hypertension    Iron deficiency anemia    Jugular vein occlusion, right (HCC)    Mitral regurgitation    a. Moderate by echo, 02/2012   Myocardial infarction [facility])    NSVT  (nonsustained ventricular tachycardia)    Ovarian cancer ([place]) 1992   PAF (paroxysmal atrial fibrillation) ([place])    Pneumonia ~ 2009   PUD (peptic ulcer disease)    TIA (transient ischemic attack)     Past Surgical History:  Procedure Laterality Date   A/V SHUNTOGRAM Left 03/19/2019   Procedure: A/V SHUNTOGRAM;  Surgeon: Katha Cabal, MD;  Location: [place] [facility];  Service: Cardiovascular;  Laterality: Left;   ABDOMINAL HYSTERECTOMY  1992   APPENDECTOMY  06/1990   AV FISTULA PLACEMENT  07/2009   left upper arm   AV FISTULA PLACEMENT Right 09/06/2016   Procedure: RIGHT FOREARM ARTERIOVENOUS (AV) GRAFT;  Surgeon: Elam Dutch, MD;  Location: [place];  Service: Vascular;  Laterality: Right;   AV FISTULA PLACEMENT N/A 02/24/2017   Procedure: INSERTION OF ARTERIOVENOUS (AV) GORE-TEX GRAFT ARM (BRACHIAL AXILLARY);  Surgeon: Katha Cabal, MD;  Location: [facility];  Service: Vascular;  Laterality: N/A;   [place] Right 09/06/2016   Procedure: REMOVAL OF Right Arm ARTERIOVENOUS GORETEX GRAFT and Vein Patch angioplasty of brachial artery;  Surgeon: Angelia Mould, MD;  Location: [place];  Service: Vascular;  Laterality: Right;   BIOPSY  09/26/2019   Procedure: BIOPSY;  Surgeon: Rogene Houston, MD;  [facility];  Service: Endoscopy;;   COLON RESECTION  1992   COLON SURGERY     COLONOSCOPY N/A 03/09/2019   Procedure: COLONOSCOPY;  Surgeon: Rogene Houston, MD;  [facility];  Service: Endoscopy;  Laterality: N/A;   COLONOSCOPY WITH PROPOFOL N/A 06/08/2021   Procedure: COLONOSCOPY WITH PROPOFOL;  Surgeon: Harvel Quale, MD;  [facility];  Service: Gastroenterology;  Laterality: N/A;  9:05 /Patient is on dialysis Mon Wed Fri   CORONARY ANGIOPLASTY WITH STENT PLACEMENT  12/15/11   "2"   CORONARY ANGIOPLASTY WITH STENT PLACEMENT  y/2013   "1; makes total of 3" (05/02/2012)   CORONARY ARTERY BYPASS GRAFT  06/13/2012   Procedure:  CORONARY ARTERY BYPASS GRAFTING (CABG);  Surgeon: Grace Isaac, MD;  Location: [place];  Service: Open Heart Surgery;  Laterality: N/A;  cabg x four;  using left internal mammary artery, and left leg greater saphenous vein harvested endoscopically   CORONARY STENT INTERVENTION N/A 10/13/2016   Procedure: Coronary Stent Intervention;  Surgeon: Troy Sine, MD;  Location: [place] [facility];  Service: Cardiovascular;  Laterality: N/A;   DIALYSIS/PERMA CATHETER REMOVAL N/A 04/18/2017   Procedure: DIALYSIS/PERMA CATHETER REMOVAL;  Surgeon: Katha Cabal, MD;  Location: [place] [facility];  Service: Cardiovascular;  Laterality: N/A;   DILATION AND CURETTAGE OF UTERUS     ENTEROSCOPY N/A 06/08/2021   Procedure: PUSH ENTEROSCOPY;  Surgeon: Harvel Quale, MD;  [facility];  Service: Gastroenterology;  Laterality: N/A;   ESOPHAGOGASTRODUODENOSCOPY  01/20/2012   Procedure: ESOPHAGOGASTRODUODENOSCOPY (EGD);  Surgeon: Ladene Artist, MD,FACG;  Location: [facility];  Service: Endoscopy;  Laterality: N/A;   ESOPHAGOGASTRODUODENOSCOPY N/A 03/26/2013   Procedure: ESOPHAGOGASTRODUODENOSCOPY (EGD);  Surgeon: Irene Shipper, MD;  Location: [name] ENDOSCOPY;  Service: Endoscopy;  Laterality: N/A;   ESOPHAGOGASTRODUODENOSCOPY N/A 04/30/2015   Procedure: ESOPHAGOGASTRODUODENOSCOPY (EGD);  Surgeon: Rogene Houston, MD;  [facility];  Service: Endoscopy;  Laterality: N/A;  1pm - moved to 10/20 @ 1:10   ESOPHAGOGASTRODUODENOSCOPY N/A 07/29/2016   Procedure: ESOPHAGOGASTRODUODENOSCOPY (EGD);  Surgeon: Manus Gunning, MD;  Location: [place];  Service: Gastroenterology;  Laterality: N/A;  enteroscopy   ESOPHAGOGASTRODUODENOSCOPY N/A 09/26/2019   Procedure: ESOPHAGOGASTRODUODENOSCOPY (EGD);  Surgeon: Rogene Houston, MD;  [facility];  Service: Endoscopy;  Laterality: N/A;  1250   ESOPHAGOGASTRODUODENOSCOPY (EGD) WITH PROPOFOL N/A 02/05/2021   Procedure:  ESOPHAGOGASTRODUODENOSCOPY (EGD) WITH PROPOFOL;  Surgeon: Eloise Harman, DO;  [facility];  Service: Endoscopy;  Laterality: N/A;   GIVENS CAPSULE STUDY N/A 03/07/2019   Procedure: GIVENS CAPSULE STUDY;  Surgeon: Rogene Houston, MD;  [facility];  Service: Endoscopy;  Laterality: N/A;  7:30   GIVENS CAPSULE STUDY N/A 04/22/2021   Procedure: GIVENS CAPSULE STUDY;  Surgeon: Rogene Houston, MD;  [facility];  Service: Endoscopy;  Laterality: N/A;  7:30   INSERTION OF DIALYSIS CATHETER N/A 10/05/2020   Procedure: ABORTED TUNNELED DIALYSIS CATHETER PLACEMENT RIGHT INTERNAL JUGULAR VEIN ;  Surgeon: Virl Cagey, MD;  [facility];  Service: General;  Laterality: N/A;   INTRAOPERATIVE TRANSESOPHAGEAL ECHOCARDIOGRAM  06/13/2012   Procedure: INTRAOPERATIVE TRANSESOPHAGEAL ECHOCARDIOGRAM;  Surgeon: Grace Isaac, MD;  Location: [place];  Service: Open Heart Surgery;  Laterality: N/A;   IR DIALY SHUNT INTRO NEEDLE/INTRACATH INITIAL W/IMG LEFT Left 10/06/2020   IR FLUORO GUIDE CV LINE RIGHT  06/17/2020   IR GENERIC HISTORICAL  07/26/2016   IR FLUORO GUIDE CV LINE RIGHT 07/26/2016 Greggory Keen, MD [facility]   IR GENERIC HISTORICAL  07/26/2016   IR US GUIDE VASC ACCESS RIGHT 07/26/2016 Greggory Keen, MD [facility]   IR GENERIC HISTORICAL  08/02/2016   IR US GUIDE VASC ACCESS RIGHT 08/02/2016 Greggory Keen, MD [facility]   IR GENERIC HISTORICAL  08/02/2016   IR FLUORO GUIDE CV LINE RIGHT 08/02/2016 Greggory Keen, MD [facility]   IR RADIOLOGY PERIPHERAL GUIDED IV START  03/28/2017   IR REMOVAL TUN CV CATH W/O FL  08/11/2020   IR THROMBECTOMY AV FISTULA W/THROMBOLYSIS INC/SHUNT/IMG LEFT Left 06/17/2020   IR US GUIDE VASC ACCESS LEFT  06/17/2020   IR US GUIDE VASC ACCESS RIGHT  03/28/2017   IR US GUIDE VASC ACCESS RIGHT  06/17/2020   LEFT HEART CATH AND CORONARY ANGIOGRAPHY N/A 09/20/2016   Procedure: Left Heart Cath and Coronary Angiography;  Surgeon: [name], MD;  Location: [place] [facility];  Service: Cardiovascular;  Laterality: N/A;   LEFT HEART CATH AND CORS/GRAFTS ANGIOGRAPHY N/A 10/13/2016   Procedure: Left Heart Cath and Cors/Grafts Angiography;  Surgeon: Troy Sine, MD;  Location: [place] [facility];  Service: Cardiovascular;  Laterality: N/A;   LEFT HEART CATH AND CORS/GRAFTS ANGIOGRAPHY N/A 07/13/2018   Procedure: LEFT HEART CATH AND CORS/GRAFTS ANGIOGRAPHY;  Surgeon: Martinique, Peter M, MD;  Location: [place] [facility];  Service: Cardiovascular;  Laterality: N/A;   LEFT HEART CATH AND CORS/GRAFTS ANGIOGRAPHY N/A 07/22/2021   Procedure: LEFT HEART CATH AND CORS/GRAFTS ANGIOGRAPHY;  Surgeon: Lorretta Harp, MD;  Location: [place] [facility];  Service: Cardiovascular;  Laterality: N/A;   LEFT HEART CATHETERIZATION WITH CORONARY ANGIOGRAM N/A 12/15/2011   Procedure: LEFT HEART CATHETERIZATION WITH CORONARY ANGIOGRAM;  Surgeon: Burnell Blanks, MD;  Location: [facility] C[facility] LAB;  Service: Cardiovascular;  Laterality: N/A;   LEFT HEART CATHETERIZATION WITH CORONARY ANGIOGRAM N/A 01/10/2012   Procedure: LEFT HEART CATHETERIZATION WITH CORONARY ANGIOGRAM;  Surgeon: Peter M Martinique, MD;  Location: [facility] CATH LAB;  Service: Cardiovascular;  Laterality: N/A;   LEFT HEART CATHETERIZATION WITH CORONARY ANGIOGRAM N/A 06/08/2012   Procedure: LEFT HEART CATHETERIZATION WITH CORONARY ANGIOGRAM;  Surgeon: Burnell Blanks, MD;  Location: [name] CATH LAB;  Service: Cardiovascular;  Laterality: N/A;   LEFT HEART CATHETERIZATION WITH CORONARY/GRAFT ANGIOGRAM N/A 12/10/2013   Procedure: LEFT HEART CATHETERIZATION WITH Beatrix Fetters;  Surgeon: [name], MD;  Location: [facility] CATH LAB;  Service: Cardiovascular;  Laterality: N/A;   OVARY SURGERY     ovarian cancer   POLYPECTOMY  03/09/2019   Procedure: POLYPECTOMY;  Surgeon: Rogene Houston, MD;  [facility];  Service: Endoscopy;;  cecal    POLYPECTOMY N/A 09/26/2019   Procedure:  DUODENAL POLYPECTOMY;  Surgeon: Rogene Houston, MD;  [facility];  Service: Endoscopy;  Laterality: N/A;   REVISION OF ARTERIOVENOUS GORETEX GRAFT N/A 02/24/2017   Procedure: REVISION OF ARTERIOVENOUS GORETEX GRAFT (RESECTION);  Surgeon: Katha Cabal, MD;  Location: [facility];  Service: Vascular;  Laterality: N/A;   REVISON OF ARTERIOVENOUS FISTULA Left 06/19/2020   Procedure: REVISION OF LEFT UPPER ARM AV GRAFT WITH INTERPOSITION JUMP GRAFT USING 6MM GORE LIMB;  Surgeon: Marty Heck, MD;  Location: [place];  Service: Vascular;  Laterality: Left;   SHUNTOGRAM N/A 10/15/2013   Procedure: Fistulogram;  Surgeon: Serafina Mitchell, MD;  Location: [name] CATH LAB;  Service: Cardiovascular;  Laterality: N/A;   THROMBECTOMY / ARTERIOVENOUS GRAFT REVISION  2011   left upper arm   TUBAL LIGATION  1980's   UPPER EXTREMITY ANGIOGRAPHY Bilateral 12/06/2016   Procedure: Upper Extremity Angiography;  Surgeon: Katha Cabal, MD;  Location: [place] [facility];  Service: Cardiovascular;  Laterality: Bilateral;   UPPER EXTREMITY INTERVENTION Left 06/06/2017   Procedure: UPPER EXTREMITY INTERVENTION;  Surgeon: Katha Cabal, MD;  Location: [place] [facility];  Service: Cardiovascular;  Laterality: Left;    Family History  Problem Relation Age of Onset   Heart disease Mother        Heart Disease before age 79   Hyperlipidemia Mother    Hypertension Mother    Diabetes Mother    Heart attack Mother    Heart disease Father        Heart Disease before age 82   Hyperlipidemia Father    Hypertension Father    Diabetes Father    Diabetes Sister    Hypertension Sister    Diabetes Brother    Hyperlipidemia Brother    Heart attack Brother    Hypertension Sister    Heart attack Brother    Colon cancer Child 39   Other Other        noncontributory for early CAD   Esophageal cancer Neg Hx    Liver disease Neg Hx    Kidney disease Neg Hx    Colon polyps Neg Hx     Social  History:  reports that she has never smoked. She has never used smokeless tobacco. She reports that she does not drink alcohol and does not use drugs.  Allergies:  Allergies  Allergen Reactions   Amlodipine Swelling   Aspirin Other (See Comments)    High Doses Mess up her stomach; "makes my bowels have blood in them". Takes 81 mg EC Aspirin    Nitrofurantoin Hives   Penicillins Other (See Comments)    SYNCOPE? , "makes me real weak when I take it; like I'll pass out"  Has patient had a PCN reaction causing immediate rash, facial/tongue/throat swelling, SOB or lightheadedness with hypotension: Yes Has patient had a PCN reaction causing severe rash involving mucus membranes or skin necrosis: no Has patient had a PCN reaction that required hospitalization no Has patient had a PCN reaction occurring within the last 10 years: no If all of the above    Bactrim [Sulfamethoxazole-Trimethoprim] Rash   Contrast Media [Iodinated Contrast Media] Itching   Iron Itching and Other (See Comments)    "they gave me iron in dialysis; had to give me Benadryl cause I had to have the iron" (05/02/2012)   Tylenol [Acetaminophen] Itching and Other (See Comments)    Makes her feet on fire per pt   Gabapentin Other (See Comments)    Unknown reaction   Iron Sucrose    Ranexa [Ranolazine] Other (See Comments)    Myoclonus-hospitalized    Sucroferric Oxyhydroxide    Dexilant [Dexlansoprazole] Other (See Comments)    Upset stomach   Hydralazine Itching    Has tolerated while inpatient Has tolerated while inpatient   Levaquin [Levofloxacin In D5w] Rash   Morphine And Related Itching and Other (See Comments)    Itching in feet   Pantoprazole Rash   Plavix [Clopidogrel Bisulfate] Rash   Protonix [Pantoprazole Sodium] Rash   Venofer [Ferric Oxide] Itching and Other (See Comments)    Patient reports using Benadryl prior to doses as [place]  Medications: I have reviewed the patient's current  medications.  Results for orders placed or performed during the hospital encounter of 09/09/21 (from the past 48 hour(s))  Comprehensive metabolic panel     Status: Abnormal   Collection Time: 09/09/21  5:01 PM  Result Value Ref Range   Sodium 142 135 - 145 mmol/L   Potassium 3.6 3.5 - 5.1 mmol/L   Chloride 100 98 - 111 mmol/L   CO2 28 22 - 32 mmol/L   Glucose, Bld 101 (H) 70 - 99 mg/dL    Comment: Glucose reference range applies only to samples taken after fasting for at least 8 hours.   BUN 39 (H) 8 - 23 mg/dL   Creatinine, Ser 6.44 (H) 0.44 - 1.00 mg/dL   Calcium 9.4 8.9 - 10.3 mg/dL   Total Protein 6.9 6.5 - 8.1 g/dL   Albumin 3.7 3.5 - 5.0 g/dL   AST 6 (L) 15 - 41 U/L   ALT 9 0 - 44 U/L   Alkaline Phosphatase 140 (H) 38 - 126 U/L   Total Bilirubin 0.4 0.3 - 1.2 mg/dL   GFR, Estimated 6 (L) >60 mL/min    Comment: (NOTE) Calculated using the CKD-EPI Creatinine Equation (2021)    Anion gap 14 5 - 15    Comment: Performed at [facility], [address], [place], [place] [address]  CBC     Status: Abnormal   Collection Time: 09/09/21  5:01 PM  Result Value Ref Range   WBC 7.1 4.0 - 10.5 K/uL   RBC 1.83 (L) 3.87 - 5.11 MIL/uL   Hemoglobin 6.6 (LL) 12.0 - 15.0 g/dL    Comment: REPEATED TO VERIFY THIS CRITICAL RESULT HAS VERIFIED AND BEEN CALLED TO B TEASLEY RN BY KIRSTENE FORSYTH ON 03 02 2023 AT 1812, AND HAS BEEN READ BACK.     HCT 22.0 (L) 36.0 - 46.0 %   MCV 120.2 (H) 80.0 - 100.0 fL   MCH 36.1 (H) 26.0 - 34.0 pg   MCHC 30.0 30.0 - 36.0 g/dL   RDW 17.2 (H) 11.5 - 15.5 %   Platelets ACLMP 150 - 400 K/uL    Comment: SPECIMEN CHECKED FOR CLOTS Immature Platelet Fraction may be clinically indicated, consider ordering this additional test [id number] PLATELET CLUMPS NOTED ON SMEAR, UNABLE TO ESTIMATE PLATELETS APPEAR ADEQUATE    nRBC 0.0 0.0 - 0.2 %    Comment: Performed at [facility], [address] [place], [place] [address]  Type and screen [facility]      Status: None (Preliminary result)   Collection Time: 09/09/21  5:01 PM  Result Value Ref Range   ABO/RH(D) O POS    Antibody Screen NEG    Sample Expiration 09/12/2021,2359    Unit Number [id number]    Blood Component Type RED CELLS,LR    Unit division 00    Status of Unit ISSUED    Transfusion Status OK TO TRANSFUSE    Crossmatch Result      COMPATIBLE Performed at [facility], [address] [place],  [address]    Unit Number [id number]    Blood Component Type RED CELLS,LR    Unit division 00    Status of Unit ISSUED    Transfusion Status OK TO TRANSFUSE    Crossmatch Result COMPATIBLE   TSH     Status: None   Collection Time: 09/09/21  5:01 PM  Result Value Ref Range   TSH 4.132 0.350 - 4.500 uIU/mL  Comment: Performed by a 3rd Generation assay with a functional sensitivity of <=0.01 uIU/mL. Performed at [facility], [address], [place], [place] [address]   POC occult blood, ED     Status: None   Collection Time: 09/09/21  6:18 PM  Result Value Ref Range   Fecal Occult Bld NEGATIVE NEGATIVE  Prepare RBC (crossmatch)     Status: None   Collection Time: 09/09/21  6:33 PM  Result Value Ref Range   Order Confirmation      ORDER PROCESSED BY BLOOD BANK Performed at [facility], [address], [place], [place] [address]   Resp Panel by RT-PCR (Flu A&B, Covid) Nasopharyngeal Swab     Status: None   Collection Time: 09/09/21  7:27 PM   Specimen: Nasopharyngeal Swab; Nasopharyngeal(NP) swabs in vial transport medium  Result Value Ref Range   SARS Coronavirus 2 by RT PCR NEGATIVE NEGATIVE    Comment: (NOTE) SARS-CoV-2 target nucleic acids are NOT DETECTED.  The SARS-CoV-2 RNA is generally detectable in upper respiratory specimens during the acute phase of infection. The lowest concentration of SARS-CoV-2 viral copies this assay can detect is 138 copies/mL. A negative result does not preclude SARS-Cov-2 infection and should not be used as the sole basis  for treatment or other patient management decisions. A negative result may occur with  improper specimen collection/handling, submission of specimen other than nasopharyngeal swab, presence of viral mutation(s) within the areas targeted by this assay, and inadequate number of viral copies(<138 copies/mL). A negative result must be combined with clinical observations, patient history, and epidemiological information. The expected result is Negative.  Fact Sheet for Patients:  EntrepreneurPulse.com.au  Fact Sheet for Healthcare Providers:  IncredibleEmployment.be  This test is no t yet approved or cleared by the [place] FDA and  has been authorized for detection and/or diagnosis of SARS-CoV-2 by FDA under an Emergency Use Authorization (EUA). This EUA will remain  in effect (meaning this test can be used) for the duration of the COVID-19 declaration under Section 564(b)(1) of the Act, 21 U.S.C.section 360bbb-3(b)(1), unless the authorization is terminated  or revoked sooner.       Influenza A by PCR NEGATIVE NEGATIVE   Influenza B by PCR NEGATIVE NEGATIVE    Comment: (NOTE) The Xpert Xpress SARS-CoV-2/FLU/RSV plus assay is intended as an aid in the diagnosis of influenza from Nasopharyngeal swab specimens and should not be used as a sole basis for treatment. Nasal washings and aspirates are unacceptable for Xpert Xpress SARS-CoV-2/FLU/RSV testing.  Fact Sheet for Patients: EntrepreneurPulse.com.au  Fact Sheet for Healthcare Providers: IncredibleEmployment.be  This test is not yet approved or cleared by the [place] FDA and has been authorized for detection and/or diagnosis of SARS-CoV-2 by FDA under an Emergency Use Authorization (EUA). This EUA will remain in effect (meaning this test can be used) for the duration of the COVID-19 declaration under Section 564(b)(1) of the Act, 21  U.S.C. section 360bbb-3(b)(1), unless the authorization is terminated or revoked.  Performed at [facility], [address], [place], [place] [address]   Troponin I (High Sensitivity)     Status: Abnormal   Collection Time: 09/09/21  9:40 PM  Result Value Ref Range   Troponin I (High Sensitivity) 32 (H) <18 ng/L    Comment: (NOTE) Elevated high sensitivity troponin I (hsTnI) values and significant  changes across serial measurements may suggest ACS but many other  chronic and acute conditions are known to elevate hsTnI results.  Refer to  the "Links" section for chest pain algorithms and additional  guidance. Performed at [facility], [address] [place], [place] [address]   CBG monitoring, ED     Status: Abnormal   Collection Time: 09/09/21 10:07 PM  Result Value Ref Range   Glucose-Capillary 135 (H) 70 - 99 mg/dL    Comment: Glucose reference range applies only to samples taken after fasting for at least 8 hours.   Comment 1 Notify RN   Glucose, capillary     Status: Abnormal   Collection Time: 09/10/21  8:03 AM  Result Value Ref Range   Glucose-Capillary 68 (L) 70 - 99 mg/dL    Comment: Glucose reference range applies only to samples taken after fasting for at least 8 hours.  Glucose, capillary     Status: Abnormal   Collection Time: 09/10/21  9:24 AM  Result Value Ref Range   Glucose-Capillary 104 (H) 70 - 99 mg/dL    Comment: Glucose reference range applies only to samples taken after fasting for at least 8 hours.   *Note: Due to a large number of results and/or encounters for the requested time period, some results have not been displayed. A complete set of results can be found in Results Review.    DG CHEST PORT 1 VIEW  Result Date: 09/09/2021 CLINICAL DATA:  Blood transfusion.  Shortness of breath. EXAM: PORTABLE CHEST 1 VIEW COMPARISON:  Chest radiograph dated 08/30/2021. FINDINGS: Cardiomegaly with vascular congestion and probable mild edema, increased since the  prior radiograph. Pneumonia is not excluded clinical correlation is recommended. No focal consolidation, pleural effusion, pneumothorax. Median sternotomy wires and CABG vascular clips. No acute osseous pathology. Degenerative changes of spine. IMPRESSION: Cardiomegaly with findings of CHF. Pneumonia is not excluded. Electronically Signed   By: Anner Crete M.D.   On: 09/09/2021 20:41    ROS: was dizzy and tired but better now Blood pressure (!) 153/51, pulse (!) 57, temperature 97.8 F (36.6 C), temperature source Oral, resp. rate 18, height 5\' 1"  (1.549 m), weight 57.2 kg, SpO2 100 %. General appearance: alert, cooperative, and no distress Resp: clear to auscultation bilaterally Cardio: irregularly irregular rhythm GI: soft, non-tender; bowel sounds normal; no masses,  no organomegaly Extremities: extremities normal, atraumatic, no cyanosis or edema Left AVG- patent    Assessment/Plan: 81 year old BF with multiple medical issues and ESRD-  frequent hospitalizations-  now with symptomatic anemia -  negative FOBT 1 anemia -  actually not that much further down from last check -  FOBT neg-  I suspect with freq hosp and blood draws as well as inconsistent ESA that is the issue-  feels better after transfusion-  will complete second if not given yet with HD 2 ESRD: MWF- [facility]-  have been having trouble with UF-  now on midodrine-  I really dont think she had volume on.  Will plan for HD today on schedule-  maybe try for a liter -  will do 3 k bath - no heparin 3 Hypertension/vol:  is on amio as well as toprol and midodrine with HD-  will cont all 4. Anemia of ESRD: as above 5. Metabolic Bone Disease: will continue home renvela -  6. Dispo-  possibly home in AM if hgb stable and still feeling good   Patient will not be physically seen over the weekend-  call with questions    Louis Meckel 09/10/2021, 9:46 AM

## 2021-09-10 NOTE — Discharge Summary (Signed)
Physician Discharge Summary   Patient: Shawna [name] MRN: [id number] DOB: 1941-02-07  Admit date:     09/09/2021  Discharge date: 09/10/21  Discharge Physician: Barton Dubois   PCP: [facility]   Recommendations at discharge:  Repeat CBC to follow hemoglobin trend. Reassess blood pressure and adjust antihypertensive treatment as needed. Sure patient has follow-up with gastroenterology service as previously instructed for further evaluation of presumed presence of polyps (she reported pending colonoscopy/polypectomy). Goals of care discussion and advance directives recommended.  Discharge Diagnoses: Active Problems:   HLD (hyperlipidemia)   GERD (gastroesophageal reflux disease)   Chronic combined systolic and diastolic CHF (congestive heart failure) (HCC)   Dyspnea   ESRD (end stage renal disease) on dialysis (HCC)   Hypertension   Generalized weakness   Hypothyroidism (acquired)   Paroxysmal Atrial fibrillation/Flutter   Symptomatic anemia    Brief Hospital admission narrative course: As per H&P written by Dr.Zierle-Ghosh on 09/09/21 Shawna [name] is a 81 y.o. female with history of chronic combined congestive heart failure, anxiety, coronary artery disease, chronic anemia, ESRD on hemodialysis, GERD, hyperlipidemia, hypertension, iron deficiency anemia, paroxysmal atrial fibrillation, and more presents ED with chief complaint of anemia.  Patient reports that she was feeling weak and fatigued so her outpatient doctor did lab work and found her to have a hemoglobin in the 6's.  Patient reports that her weakness and fatigue started 1 week ago.  She is also had chest pain more often.  Her last nitro use was day before yesterday.  She reports the chest pain feels like a pressure.  No radiation.  Relieved with nitro.  She denies shortness of breath, but admits to paresthesias in her feet.  She is also had a headache yesterday that felt like a sharp pain in her bilateral temples.   She has had dizziness upon standing as well.  She reports dark green stools, but no black stools.  She was FOBT negative in the ER.  She denies any vomiting.  Patient does report that she was supposed to have a colonoscopy around Christmas, but did not have 1 because he cannot get an IV.  That was a follow-up for polyps.  Patient denies any bruising, epistaxis, bleeding from her gums.  Patient has no other complaints at this time.     Patient does not smoke, does not drink alcohol, does not use illicit drugs.  Patient is vaccinated for COVID.  Patient is full code.  Assessment and Plan: Symptomatic anemia -Patient complains of generalized weakness and fatigue -Hgb 6.6 at time of admission; baseline 8-9. -FOBT negative -Most likely anemia of chronic disease -2 units PRBCs has been transfused during this hospitalization -Patient to have follow-up with gastroenterology as previously scheduled. -IV iron and Epogen therapy as per nephrology discretion. -Continue to follow hemoglobin trend.   Paroxysmal Atrial fibrillation/Flutter -Continue amiodarone and metoprolol -No major abnormalities appreciated on telemetry; patient denies palpitations. -Continue outpatient follow-up with cardiology service.  Hypothyroidism (acquired) -Continue synthroid   Generalized weakness -Most likely secondary to anemia -TSH within normal limits -Physical therapy has seen patient with recommendation for home health PT at discharge.  Hypertension -Overall stable -Continue current antihypertensive regimen -Heart healthy diet discussed with patient.  ESRD (end stage renal disease) on dialysis [facility]) -Outpatient schedule HD MWF -Receive hemodialysis while inpatient -Continue to follow renal diet and Renvela -Continue the use of midodrine on dialysis date to stabilize blood pressure and facilitate ultrafiltration. -Continue outpatient follow-up with nephrology service -Next hemodialysis  treatment  09/13/2021.   Dyspnea - In the setting of symptomatic anemia; hemoglobin 6.6 at time of admission. -Symptoms improved/resolved after transfusions provided. -Continue to follow hemoglobin trend; further transfuse as needed. -Patient will benefit of IV iron/ESA therapy as per nephrology discretion.  Chronic combined systolic and diastolic CHF (congestive heart failure) (HCC) -Currently chest pain-free and after transfusion no complaining of shortness of breath. -Continue heart regimen with the use of beta-blocker, baby aspirin, statins and amiodarone. -Heart healthy diet encouraged. -Continue to watch sodium intake, check weight on daily basis and continue volume management with dialysis. -Condition compensated.  GERD (gastroesophageal reflux disease) -Continue PPI -Lifestyle changes discussed with patient -Continue outpatient follow-up with gastroenterology service.  HLD (hyperlipidemia) -Continue crestor -Follow LFTs and lipid panel as an outpatient.   Consultants: Nephrology service Procedures performed: See below for x-ray reports. Disposition: Discharged home with home health PT and instructions to follow-up with PCP as an outpatient.  Diet recommendation: Heart healthy/renal diet.  DISCHARGE MEDICATION: Allergies as of 09/10/2021       Reactions   Amlodipine Swelling   Aspirin Other (See Comments)   High Doses Mess up her stomach; "makes my bowels have blood in them". Takes 81 mg EC Aspirin    Nitrofurantoin Hives   Penicillins Other (See Comments)   SYNCOPE? , "makes me real weak when I take it; like I'll pass out" Has patient had a PCN reaction causing immediate rash, facial/tongue/throat swelling, SOB or lightheadedness with hypotension: Yes Has patient had a PCN reaction causing severe rash involving mucus membranes or skin necrosis: no Has patient had a PCN reaction that required hospitalization no Has patient had a PCN reaction occurring within the last 10 years:  no If all of the above   Bactrim [sulfamethoxazole-trimethoprim] Rash   Contrast Media [iodinated Contrast Media] Itching   Iron Itching, Other (See Comments)   "they gave me iron in dialysis; had to give me Benadryl cause I had to have the iron" (05/02/2012)   Tylenol [acetaminophen] Itching, Other (See Comments)   Makes her feet on fire per pt   Gabapentin Other (See Comments)   Unknown reaction   Iron Sucrose    Ranexa [ranolazine] Other (See Comments)   Myoclonus-hospitalized    Sucroferric Oxyhydroxide    Dexilant [dexlansoprazole] Other (See Comments)   Upset stomach   Hydralazine Itching   Has tolerated while inpatient Has tolerated while inpatient   Levaquin [levofloxacin In D5w] Rash   Morphine And Related Itching, Other (See Comments)   Itching in feet   Pantoprazole Rash   Plavix [clopidogrel Bisulfate] Rash   Protonix [pantoprazole Sodium] Rash   Venofer [ferric Oxide] Itching, Other (See Comments)   Patient reports using Benadryl prior to doses as [place]        Medication List     TAKE these medications    ALPRAZolam 0.5 MG tablet Commonly known as: XANAX Take 0.5 mg by mouth at bedtime.   amiodarone 100 MG tablet Commonly known as: PACERONE Take 1 tablet (100 mg total) by mouth daily.   aspirin EC 81 MG tablet Take 1 tablet (81 mg total) by mouth daily with breakfast.   hydrOXYzine 100 MG capsule Commonly known as: VISTARIL Take 100 mg by mouth daily as needed for anxiety.   levothyroxine 25 MCG tablet Commonly known as: SYNTHROID Take 1 tablet (25 mcg total) by mouth daily.   lidocaine-prilocaine cream Commonly known as: EMLA Apply 1 application topically every Monday, Wednesday, and Friday.  Prior to dialysis   loperamide 2 MG tablet Commonly known as: IMODIUM A-D Take 2 tablets (4 mg total) by mouth daily as needed for diarrhea or loose stools.   metoprolol succinate 25 MG 24 hr tablet Commonly known as: Toprol XL Take 1 tablet  (25 mg total) by mouth daily.   midodrine 5 MG tablet Commonly known as: PROAMATINE Take 1 tablet (5 mg total) by mouth See admin instructions. Take 1 to 2 tablets on  Monday, Wednesday, Friday morning BEFORE dialysis   multivitamin Tabs tablet Take 1 tablet by mouth daily.   nitroGLYCERIN 0.4 MG SL tablet Commonly known as: NITROSTAT Place 1 tablet (0.4 mg total) under the tongue every 5 (five) minutes x 3 doses as needed for chest pain (if no relief after 2nd dose, proceed to the ED for an evaluation or call 911).   omeprazole 20 MG capsule Commonly known as: PRILOSEC Take 1 capsule (20 mg total) by mouth daily.   rosuvastatin 10 MG tablet Commonly known as: Crestor Take 1 tablet (10 mg total) by mouth daily.   sevelamer carbonate 800 MG tablet Commonly known as: RENVELA Take 3 tablets (2,400 mg total) by mouth 3 (three) times daily with meals.        Follow-up Information     [facility]. Schedule an appointment as soon as possible for a visit in 10 day(s).   Contact information: [place] [address] [phone]         Arnoldo Lenis, MD .   Specialty: Cardiology Contact information: [address] [place] [address] [phone]                 Discharge Exam: Danley Danker Weights   09/09/21 1605 09/09/21 2347  Weight: 55.8 kg 57.2 kg   General exam: Alert, awake, oriented x 3; reports feeling much better after transfusion provided.  No chest pain or shortness of breath currently. Respiratory system: Clear to auscultation. Respiratory effort normal.  No requiring oxygen supplementation. Cardiovascular system: Rate controlled. No murmurs, rubs, gallops.  No JVD on exam. Gastrointestinal system: Abdomen is nondistended, soft and nontender. No organomegaly or masses felt. Normal bowel sounds heard. Central nervous system: Alert and oriented. No focal neurological deficits. Extremities: No cyanosis or clubbing. Skin: No  petechiae. Psychiatry: Judgement and insight appear normal. Mood & affect appropriate.    Condition at discharge: Stable and improved.  The results of significant diagnostics from this hospitalization (including imaging, microbiology, ancillary and laboratory) are listed below for reference.   Imaging Studies: CT HEAD WO CONTRAST (5MM)  Result Date: 08/30/2021 CLINICAL DATA:  Right-sided headache. EXAM: CT HEAD WITHOUT CONTRAST TECHNIQUE: Contiguous axial images were obtained from the base of the skull through the vertex without intravenous contrast. RADIATION DOSE REDUCTION: This exam was performed according to the departmental dose-optimization program which includes automated exposure control, adjustment of the mA and/or kV according to patient size and/or use of iterative reconstruction technique. COMPARISON:  July 20, 2020 FINDINGS: Brain: There is mild cerebral atrophy with widening of the extra-axial spaces and ventricular dilatation. There are areas of decreased attenuation within the white matter tracts of the supratentorial brain, consistent with microvascular disease changes. Vascular: No hyperdense vessel or unexpected calcification. Skull: Normal. Negative for fracture or focal lesion. Sinuses/Orbits: No acute finding. Other: None. IMPRESSION: No acute intracranial abnormality. Electronically Signed   By: Virgina Norfolk M.D.   On: 08/30/2021 03:43   DG CHEST PORT 1 VIEW  Result Date: 09/09/2021 CLINICAL DATA:  Blood transfusion.  Shortness of breath. EXAM: PORTABLE CHEST 1 VIEW COMPARISON:  Chest radiograph dated 08/30/2021. FINDINGS: Cardiomegaly with vascular congestion and probable mild edema, increased since the prior radiograph. Pneumonia is not excluded clinical correlation is recommended. No focal consolidation, pleural effusion, pneumothorax. Median sternotomy wires and CABG vascular clips. No acute osseous pathology. Degenerative changes of spine. IMPRESSION: Cardiomegaly  with findings of CHF. Pneumonia is not excluded. Electronically Signed   By: Anner Crete M.D.   On: 09/09/2021 20:41   DG Chest Port 1 View  Result Date: 08/30/2021 CLINICAL DATA:  Chest pain EXAM: PORTABLE CHEST 1 VIEW COMPARISON:  08/15/2021 FINDINGS: Cardiac shadow is mildly prominent but stable. Postsurgical changes are again seen. Lungs are well aerated bilaterally. Minimal left basilar atelectasis is noted. Stenting is noted in the left arm. IMPRESSION: Minimal left basilar atelectasis. Electronically Signed   By: Inez Catalina M.D.   On: 08/30/2021 03:07   DG Chest Port 1 View  Result Date: 08/15/2021 CLINICAL DATA:  Chest pain EXAM: PORTABLE CHEST 1 VIEW COMPARISON:  Chest x-rays dated 07/17/2021 and 03/29/2017. FINDINGS: Stable cardiomegaly. Median sternotomy wires appear intact and stable in alignment. Coarse lung markings bilaterally. No confluent opacity to suggest a superimposed pneumonia or pulmonary edema. No pleural effusion or pneumothorax is seen. No acute-appearing osseous abnormality. IMPRESSION: 1. No active disease. No evidence of pneumonia or pulmonary edema. 2. Stable cardiomegaly. 3. Coarse lung markings bilaterally, suspected chronic interstitial lung disease. Electronically Signed   By: Franki Cabot M.D.   On: 08/15/2021 12:22    Microbiology: Results for orders placed or performed during the hospital encounter of 09/09/21  Resp Panel by RT-PCR (Flu A&B, Covid) Nasopharyngeal Swab     Status: None   Collection Time: 09/09/21  7:27 PM   Specimen: Nasopharyngeal Swab; Nasopharyngeal(NP) swabs in vial transport medium  Result Value Ref Range Status   SARS Coronavirus 2 by RT PCR NEGATIVE NEGATIVE Final    Comment: (NOTE) SARS-CoV-2 target nucleic acids are NOT DETECTED.  The SARS-CoV-2 RNA is generally detectable in upper respiratory specimens during the acute phase of infection. The lowest concentration of SARS-CoV-2 viral copies this assay can detect is 138  copies/mL. A negative result does not preclude SARS-Cov-2 infection and should not be used as the sole basis for treatment or other patient management decisions. A negative result may occur with  improper specimen collection/handling, submission of specimen other than nasopharyngeal swab, presence of viral mutation(s) within the areas targeted by this assay, and inadequate number of viral copies(<138 copies/mL). A negative result must be combined with clinical observations, patient history, and epidemiological information. The expected result is Negative.  Fact Sheet for Patients:  EntrepreneurPulse.com.au  Fact Sheet for Healthcare Providers:  IncredibleEmployment.be  This test is no t yet approved or cleared by the [place] FDA and  has been authorized for detection and/or diagnosis of SARS-CoV-2 by FDA under an Emergency Use Authorization (EUA). This EUA will remain  in effect (meaning this test can be used) for the duration of the COVID-19 declaration under Section 564(b)(1) of the Act, 21 U.S.C.section 360bbb-3(b)(1), unless the authorization is terminated  or revoked sooner.       Influenza A by PCR NEGATIVE NEGATIVE Final   Influenza B by PCR NEGATIVE NEGATIVE Final    Comment: (NOTE) The Xpert Xpress SARS-CoV-2/FLU/RSV plus assay is intended as an aid in the diagnosis of influenza from Nasopharyngeal swab specimens and should not be used  as a sole basis for treatment. Nasal washings and aspirates are unacceptable for Xpert Xpress SARS-CoV-2/FLU/RSV testing.  Fact Sheet for Patients: EntrepreneurPulse.com.au  Fact Sheet for Healthcare Providers: IncredibleEmployment.be  This test is not yet approved or cleared by the [place] FDA and has been authorized for detection and/or diagnosis of SARS-CoV-2 by FDA under an Emergency Use Authorization (EUA). This EUA will remain in effect (meaning  this test can be used) for the duration of the COVID-19 declaration under Section 564(b)(1) of the Act, 21 U.S.C. section 360bbb-3(b)(1), unless the authorization is terminated or revoked.  Performed at [facility], [address] [place],  [address]    *Note: Due to a large number of results and/or encounters for the requested time period, some results have not been displayed. A complete set of results can be found in Results Review.    Labs: CBC: Recent Labs  Lab 09/09/21 1701  WBC 7.1  HGB 6.6*  HCT 22.0*  MCV 120.2*  PLT ACLMP   Basic Metabolic Panel: Recent Labs  Lab 09/09/21 1701  NA 142  K 3.6  CL 100  CO2 28  GLUCOSE 101*  BUN 39*  CREATININE 6.44*  CALCIUM 9.4   Liver Function Tests: Recent Labs  Lab 09/09/21 1701  AST 6*  ALT 9  ALKPHOS 140*  BILITOT 0.4  PROT 6.9  ALBUMIN 3.7   CBG: Recent Labs  Lab 09/09/21 2207 09/10/21 0803 09/10/21 0924 09/10/21 1205  GLUCAP 135* 68* 104* 100*    Discharge time spent: greater than 30 minutes.  Signed: Barton Dubois, MD [facility] 09/10/2021

## 2021-09-10 NOTE — Assessment & Plan Note (Signed)
-   In the setting of symptomatic anemia; hemoglobin 6.6 at time of admission. ?-Symptoms improved/resolved after transfusions provided. ?-Continue to follow hemoglobin trend; further transfuse as needed. ?-Patient will benefit of IV iron/ESA therapy as per nephrology discretion. ?

## 2021-09-10 NOTE — Evaluation (Signed)
Physical Therapy Evaluation ?Patient Details ?Name: PRUDIE GUTHRIDGE ?MRN: [id number] ?DOB: Nov 12, 1940 ?Today's Date: 09/10/2021 ? ?History of Present Illness ? DUHA ABAIR is a 81 y.o. female with history of chronic combined congestive heart failure, anxiety, coronary artery disease, chronic anemia, ESRD on hemodialysis, GERD, hyperlipidemia, hypertension, iron deficiency anemia, paroxysmal atrial fibrillation, and more presents ED with chief complaint of anemia.  Patient reports that she was feeling weak and fatigued so her outpatient doctor did lab work and found her to have a hemoglobin in the 6's.  Patient reports that her weakness and fatigue started 1 week ago.  She is also had chest pain more often.  Her last nitro use was day before yesterday.  She reports the chest pain feels like a pressure.  No radiation.  Relieved with nitro.  She denies shortness of breath, but admits to paresthesias in her feet.  She is also had a headache yesterday that felt like a sharp pain in her bilateral temples.  She has had dizziness upon standing as well.  She reports dark green stools, but no black stools.  She was FOBT negative in the ER.  She denies any vomiting.  Patient does report that she was supposed to have a colonoscopy around Christmas, but did not have 1 because he cannot get an IV.  That was a follow-up for polyps.  Patient denies any bruising, epistaxis, bleeding from her gums.  Patient has no other complaints at this time. ?  ?Clinical Impression ? Patient functioning near baseline for mobility and gait but does demonstrate some generalized weakness and balance  deficits. Patient does not require assist for bed mobility and demonstrates good sitting tolerance and sitting balance EOB. Patient able to transfer to standing without AD and is slightly unsteady initially. She ambulates without AD with slow cadence and intermittent unsteadiness without loss of balance. Patient ends session seated in chair at bedside. Patient  will benefit from continued skilled physical therapy in hospital and recommended venue below to increase strength, balance, endurance for safe ADLs and gait. ? ?   ? ?Recommendations for follow up therapy are one component of a multi-disciplinary discharge planning process, led by the attending physician.  Recommendations may be updated based on patient status, additional functional criteria and insurance authorization. ? ?Follow Up Recommendations Home health PT ? ?  ?Assistance Recommended at Discharge Intermittent Supervision/Assistance  ?Patient can return home with the following ? A little help with walking and/or transfers;A little help with bathing/dressing/bathroom;Assistance with cooking/housework;Assist for transportation;Help with stairs or ramp for entrance ? ?  ?Equipment Recommendations None recommended by PT  ?Recommendations for Other Services ?    ?  ?Functional Status Assessment Patient has had a recent decline in their functional status and demonstrates the ability to make significant improvements in function in a reasonable and predictable amount of time.  ? ?  ?Precautions / Restrictions Precautions ?Precautions: Fall ?Restrictions ?Weight Bearing Restrictions: No  ? ?  ? ?Mobility ? Bed Mobility ?Overal bed mobility: Modified Independent ?Bed Mobility: Supine to Sit ?  ?  ?Supine to sit: HOB elevated ?  ?  ?  ?  ? ?Transfers ?Overall transfer level: Needs assistance ?Equipment used: None ?Transfers: Sit to/from Stand ?Sit to Stand: Min guard ?  ?  ?  ?  ?  ?General transfer comment: slightly unsteady upon standing ?  ? ?Ambulation/Gait ?Ambulation/Gait assistance: Min guard ?Gait Distance (Feet): 40 Feet ?Assistive device: None ?Gait Pattern/deviations: Step-through pattern ?  ?  ?  ?  General Gait Details: slow cadence with intermittent unsteadiness, without AD ? ?Stairs ?  ?  ?  ?  ?  ? ?Wheelchair Mobility ?  ? ?Modified Rankin (Stroke Patients Only) ?  ? ?  ? ?Balance Overall balance  assessment: Needs assistance ?Sitting-balance support: Feet supported, No upper extremity supported ?Sitting balance-Leahy Scale: Good ?Sitting balance - Comments: seated EOB ?  ?Standing balance support: No upper extremity supported ?Standing balance-Leahy Scale: Fair ?Standing balance comment: without AD ?  ?  ?  ?  ?  ?  ?  ?  ?  ?  ?  ?   ? ? ? ?Pertinent Vitals/Pain Pain Assessment ?Pain Assessment: No/denies pain  ? ? ?Home Living Family/patient expects to be discharged to:: Private residence ?Living Arrangements: Spouse/significant other ?Available Help at Discharge: Family;Available 24 hours/day ?Type of Home: House ?Home Access: Stairs to enter ?Entrance Stairs-Rails: Right ?Entrance Stairs-Number of Steps: 2 ?  ?Home Layout: One level ?Home Equipment: Rollator (4 wheels);Cane - single point ?   ?  ?Prior Function Prior Level of Function : Needs assist;Independent/Modified Independent ?  ?  ?  ?  ?  ?  ?Mobility Comments: Patient states uses rollator or SPC PRN; spouse assists with stairs ?ADLs Comments: independent ?  ? ? ?Hand Dominance  ?   ? ?  ?Extremity/Trunk Assessment  ? Upper Extremity Assessment ?Upper Extremity Assessment: Generalized weakness ?  ? ?Lower Extremity Assessment ?Lower Extremity Assessment: Generalized weakness ?  ? ?   ?Communication  ? Communication: No difficulties  ?Cognition Arousal/Alertness: Awake/alert ?Behavior During Therapy: [facility] for tasks assessed/performed ?Overall Cognitive Status: Within Functional Limits for tasks assessed ?  ?  ?  ?  ?  ?  ?  ?  ?  ?  ?  ?  ?  ?  ?  ?  ?  ?  ?  ? ?  ?General Comments   ? ?  ?Exercises    ? ?Assessment/Plan  ?  ?PT Assessment Patient needs continued PT services  ?PT Problem List Decreased strength;Decreased mobility;Decreased range of motion;Decreased activity tolerance;Decreased balance ? ?   ?  ?PT Treatment Interventions DME instruction;Therapeutic exercise;Gait training;Balance training;Stair training;Neuromuscular  re-education;Functional mobility training;Therapeutic activities;Patient/family education   ? ?PT Goals (Current goals can be found in the Care Plan section)  ?Acute Rehab PT Goals ?Patient Stated Goal: Return home ?PT Goal Formulation: With patient ?Time For Goal Achievement: 09/24/21 ?Potential to Achieve Goals: Good ? ?  ?Frequency Min 3X/week ?  ? ? ?Co-evaluation   ?  ?  ?  ?  ? ? ?  ?AM-PAC PT "6 Clicks" Mobility  ?Outcome Measure Help needed turning from your back to your side while in a flat bed without using bedrails?: None ?Help needed moving from lying on your back to sitting on the side of a flat bed without using bedrails?: A Little ?Help needed moving to and from a bed to a chair (including a wheelchair)?: A Little ?Help needed standing up from a chair using your arms (e.g., wheelchair or bedside chair)?: A Little ?Help needed to walk in hospital room?: A Little ?Help needed climbing 3-5 steps with a railing? : A Lot ?6 Click Score: 18 ? ?  ?End of Session Equipment Utilized During Treatment: Gait belt ?Activity Tolerance: Patient tolerated treatment well ?Patient left: in chair;with call bell/phone within reach ?Nurse Communication: Mobility status ?PT Visit Diagnosis: Unsteadiness on feet (R26.81);Other abnormalities of gait and mobility (R26.89);Muscle weakness (generalized) (M62.81) ?  ? ?  Time: 5053-9767 ?PT Time Calculation (min) (ACUTE ONLY): 19 min ? ? ?Charges:   PT Evaluation ?$PT Eval Low Complexity: 1 Low ?PT Treatments ?$Therapeutic Activity: 8-22 mins ?  ?   ? ? ?2:30 PM, 09/10/21 ?Mearl Latin PT, DPT ?Physical Therapist at [facility] ?[facility] ? ? ?

## 2021-09-10 NOTE — Plan of Care (Signed)
?  Problem: Acute Rehab PT Goals(only PT should resolve) ?Goal: Patient Will Transfer Sit To/From Stand ?Outcome: Progressing ?Flowsheets (Taken 09/10/2021 1431) ?Patient will transfer sit to/from stand: with modified independence ?Goal: Pt Will Transfer Bed To Chair/Chair To Bed ?Outcome: Progressing ?Flowsheets (Taken 09/10/2021 1431) ?Pt will Transfer Bed to Chair/Chair to Bed: with modified independence ?Goal: Pt Will Ambulate ?Outcome: Progressing ?Flowsheets (Taken 09/10/2021 1431) ?Pt will Ambulate: ? 75 feet ? with modified independence ? with least restrictive assistive device ?Goal: Pt/caregiver will Perform Home Exercise Program ?Outcome: Progressing ?Flowsheets (Taken 09/10/2021 1431) ?Pt/caregiver will Perform Home Exercise Program: ? For increased strengthening ? For improved balance ? Independently ? 2:32 PM, 09/10/21 ?Mearl Latin PT, DPT ?Physical Therapist at [facility] ?[facility] ? ?

## 2021-09-11 LAB — HEPATITIS B SURFACE ANTIBODY, QUANTITATIVE: Hep B S AB Quant (Post): 6.2 m[IU]/mL — ABNORMAL LOW (ref >9.9)

## 2021-09-12 LAB — BPAM RBC
Blood Product Expiration Date: 202304062359
Blood Product Expiration Date: 202304062359
ISSUE DATE / TIME: 202303030010
ISSUE DATE / TIME: 202303030358
Unit Type and Rh: 5100
Unit Type and Rh: 9500

## 2021-09-12 LAB — TYPE AND SCREEN
ABO/RH(D): O POS
Antibody Screen: NEGATIVE
Unit division: 0
Unit division: 0

## 2021-09-16 ENCOUNTER — Ambulatory Visit (INDEPENDENT_AMBULATORY_CARE_PROVIDER_SITE_OTHER): Payer: Medicare HMO | Admitting: Gastroenterology

## 2021-10-17 ENCOUNTER — Emergency Department (HOSPITAL_COMMUNITY): Payer: Medicare HMO

## 2021-10-17 ENCOUNTER — Emergency Department (HOSPITAL_COMMUNITY)
Admission: EM | Admit: 2021-10-17 | Discharge: 2021-10-17 | Disposition: A | Discharge status: Home / Self Care | Payer: Medicare HMO | Attending: Emergency Medicine | Admitting: Emergency Medicine

## 2021-10-17 ENCOUNTER — Encounter (HOSPITAL_COMMUNITY): Payer: Self-pay

## 2021-10-17 ENCOUNTER — Other Ambulatory Visit: Payer: Self-pay

## 2021-10-17 DIAGNOSIS — R0602 Shortness of breath: Secondary | ICD-10-CM | POA: Diagnosis present

## 2021-10-17 DIAGNOSIS — I132 Hypertensive heart and chronic kidney disease with heart failure and with stage 5 chronic kidney disease, or end stage renal disease: Secondary | ICD-10-CM | POA: Insufficient documentation

## 2021-10-17 DIAGNOSIS — R710 Precipitous drop in hematocrit: Secondary | ICD-10-CM | POA: Diagnosis not present

## 2021-10-17 DIAGNOSIS — N186 End stage renal disease: Secondary | ICD-10-CM | POA: Insufficient documentation

## 2021-10-17 DIAGNOSIS — I5033 Acute on chronic diastolic (congestive) heart failure: Secondary | ICD-10-CM | POA: Insufficient documentation

## 2021-10-17 DIAGNOSIS — Z992 Dependence on renal dialysis: Secondary | ICD-10-CM | POA: Insufficient documentation

## 2021-10-17 DIAGNOSIS — Z7982 Long term (current) use of aspirin: Secondary | ICD-10-CM | POA: Diagnosis not present

## 2021-10-17 DIAGNOSIS — H6122 Impacted cerumen, left ear: Secondary | ICD-10-CM

## 2021-10-17 DIAGNOSIS — E876 Hypokalemia: Secondary | ICD-10-CM | POA: Insufficient documentation

## 2021-10-17 DIAGNOSIS — R944 Abnormal results of kidney function studies: Secondary | ICD-10-CM | POA: Insufficient documentation

## 2021-10-17 DIAGNOSIS — R778 Other specified abnormalities of plasma proteins: Secondary | ICD-10-CM | POA: Insufficient documentation

## 2021-10-17 DIAGNOSIS — H9202 Otalgia, left ear: Secondary | ICD-10-CM

## 2021-10-17 DIAGNOSIS — Z79899 Other long term (current) drug therapy: Secondary | ICD-10-CM | POA: Insufficient documentation

## 2021-10-17 LAB — BASIC METABOLIC PANEL
Anion gap: 14 (ref 5–15)
BUN: 76 mg/dL — ABNORMAL HIGH (ref 8–23)
CO2: 24 mmol/L (ref 22–32)
Calcium: 8.9 mg/dL (ref 8.9–10.3)
Chloride: 103 mmol/L (ref 98–111)
Creatinine, Ser: 7.95 mg/dL — ABNORMAL HIGH (ref 0.44–1.00)
GFR, Estimated: 5 mL/min — ABNORMAL LOW (ref >60)
Glucose, Bld: 100 mg/dL — ABNORMAL HIGH (ref 70–99)
Potassium: 4.6 mmol/L (ref 3.5–5.1)
Sodium: 141 mmol/L (ref 135–145)

## 2021-10-17 LAB — CBC WITH DIFFERENTIAL/PLATELET
Abs Immature Granulocytes: 0.06 10*3/uL (ref 0.00–0.07)
Basophils Absolute: 0.1 10*3/uL (ref 0.0–0.1)
Basophils Relative: 1 %
Eosinophils Absolute: 0.2 10*3/uL (ref 0.0–0.5)
Eosinophils Relative: 2 %
HCT: 27.8 % — ABNORMAL LOW (ref 36.0–46.0)
Hemoglobin: 8.5 g/dL — ABNORMAL LOW (ref 12.0–15.0)
Immature Granulocytes: 1 %
Lymphocytes Relative: 8 %
Lymphs Abs: 0.8 10*3/uL (ref 0.7–4.0)
MCH: 33.1 pg (ref 26.0–34.0)
MCHC: 30.6 g/dL (ref 30.0–36.0)
MCV: 108.2 fL — ABNORMAL HIGH (ref 80.0–100.0)
Monocytes Absolute: 1 10*3/uL (ref 0.1–1.0)
Monocytes Relative: 10 %
Neutro Abs: 8 10*3/uL — ABNORMAL HIGH (ref 1.7–7.7)
Neutrophils Relative %: 78 %
Platelets: ADEQUATE 10*3/uL (ref 150–400)
RBC: 2.57 MIL/uL — ABNORMAL LOW (ref 3.87–5.11)
RDW: 19.2 % — ABNORMAL HIGH (ref 11.5–15.5)
WBC: 10.1 10*3/uL (ref 4.0–10.5)
nRBC: 0 % (ref 0.0–0.2)

## 2021-10-17 LAB — BRAIN NATRIURETIC PEPTIDE: B Natriuretic Peptide: 2622 pg/mL — ABNORMAL HIGH (ref 0.0–100.0)

## 2021-10-17 LAB — TROPONIN I (HIGH SENSITIVITY): Troponin I (High Sensitivity): 96 ng/L — ABNORMAL HIGH (ref <18)

## 2021-10-17 MED ORDER — SODIUM CHLORIDE 0.9 % IR SOLN: Freq: Once | Status: DC

## 2021-10-17 MED ORDER — CEFDINIR 300 MG PO CAPS: 300.0000 mg | ORAL_CAPSULE | Freq: Two times a day (BID) | ORAL | 0 refills | Status: DC

## 2021-10-17 MED ORDER — HYDROGEN PEROXIDE 3 % EX SOLN
Freq: Once | CUTANEOUS | Status: DC
  Filled 2021-10-17: qty 473

## 2021-10-17 NOTE — ED Notes (Signed)
Annie Main RN attempted ultrasound guided IV & blood work x 2 unsuccessfully. Per Annie Main when he got catheter in to vein patient jerked her arm back.  ?

## 2021-10-17 NOTE — ED Notes (Signed)
Phlebotomist attempted to obtain blood work unsuccessfully. Stated patient was hard to find a vein and another phlebotomist would come.  ?

## 2021-10-17 NOTE — ED Notes (Signed)
Notified charge nurse of need for ultrasound guided IV and blood draw. ?

## 2021-10-17 NOTE — ED Provider Notes (Signed)
?[facility] ?Provider Note ? ? ?CSN: [id number] ?Arrival date & time: 10/17/21  1032 ? ?  ? ?History ? ?Chief Complaint  ?Patient presents with  ? Shortness of Breath  ? ? ?Shawna [name] is a 81 y.o. female. ? ? ?Shortness of Breath ?Associated symptoms: chest pain and ear pain   ?Associated symptoms: no abdominal pain, no fever, no headaches, no vomiting and no wheezing   ? ?  ? ?Shawna [name] is a 81 y.o. female with past medical history of hypertension anemia, CHF paroxysmal atrial fibrillation and end-stage renal disease on hemodialysis Monday Wednesday Friday.  she presents to the Emergency Department complaining of left ear pain, left chest pain and shortness of breath.  She describes throbbing pain of her left ear x1 week.  Pain radiates to the left side of her head.  She was seen by PCP and prescribed drops for her ears which she states have not helped.  Pain to the left ear is throbbing and constant.  She states that she was last dialyzed on Friday but states that she did not receive her full treatment due to a drop in her blood pressure.  Last evening, she had some fleeting sharp pains of her left chest and feels that she is fluid overloaded.  She denies any arm or jaw pain, diaphoresis, fever or chills. ? ?Home Medications ?Prior to Admission medications   ?Medication Sig Start Date End Date Taking? Authorizing Provider  ?ALPRAZolam (XANAX) 0.5 MG tablet Take 0.5 mg by mouth at bedtime. 03/20/21   [provider]  ?amiodarone (PACERONE) 100 MG tablet Take 1 tablet (100 mg total) by mouth daily. 08/20/21   Roxan Hockey, MD  ?aspirin EC 81 MG tablet Take 1 tablet (81 mg total) by mouth daily with breakfast. 08/20/21   Roxan Hockey, MD  ?hydrOXYzine (VISTARIL) 100 MG capsule Take 100 mg by mouth daily as needed for anxiety. 11/23/20   [provider]  ?levothyroxine (SYNTHROID) 25 MCG tablet Take 1 tablet (25 mcg total) by mouth daily. 02/02/21   Brita Romp, NP   ?lidocaine-prilocaine (EMLA) cream Apply 1 application topically every Monday, Wednesday, and Friday. Prior to dialysis    [provider]  ?loperamide (IMODIUM A-D) 2 MG tablet Take 2 tablets (4 mg total) by mouth daily as needed for diarrhea or loose stools. 12/17/20   Thurnell Lose, MD  ?metoprolol succinate (TOPROL XL) 25 MG 24 hr tablet Take 1 tablet (25 mg total) by mouth daily. 08/20/21   Roxan Hockey, MD  ?midodrine (PROAMATINE) 5 MG tablet Take 1 tablet (5 mg total) by mouth See admin instructions. Take 1 to 2 tablets on  Monday, Wednesday, Friday morning BEFORE dialysis 08/20/21   Roxan Hockey, MD  ?multivitamin (RENA-VIT) TABS tablet Take 1 tablet by mouth daily.    [provider]  ?nitroGLYCERIN (NITROSTAT) 0.4 MG SL tablet Place 1 tablet (0.4 mg total) under the tongue every 5 (five) minutes x 3 doses as needed for chest pain (if no relief after 2nd dose, proceed to the ED for an evaluation or call 911). 02/19/21   Verta Ellen., NP  ?omeprazole (PRILOSEC) 20 MG capsule Take 1 capsule (20 mg total) by mouth daily. 12/01/20   Rogene Houston, MD  ?rosuvastatin (CRESTOR) 10 MG tablet Take 1 tablet (10 mg total) by mouth daily. ?Patient not taking: Reported on 09/09/2021 05/13/21   Erma Heritage, PA-C  ?sevelamer carbonate (RENVELA) 800 MG tablet  Take 3 tablets (2,400 mg total) by mouth 3 (three) times daily with meals. 08/20/21   Roxan Hockey, MD  ?   ? ?Allergies    ?Amlodipine, Aspirin, Nitrofurantoin, Penicillins, Bactrim [sulfamethoxazole-trimethoprim], Contrast media [iodinated contrast media], Iron, Tylenol [acetaminophen], Gabapentin, Iron sucrose, Ranexa [ranolazine], Sucroferric oxyhydroxide, Dexilant [dexlansoprazole], Hydralazine, Levaquin [levofloxacin in d5w], Morphine and related, Pantoprazole, Plavix [clopidogrel bisulfate], Protonix [pantoprazole sodium], and Venofer [ferric oxide]   ? ?Review of Systems   ?Review of Systems  ?Constitutional:   Negative for chills and fever.  ?HENT:  Positive for ear pain. Negative for tinnitus.   ?Respiratory:  Positive for shortness of breath. Negative for wheezing.   ?Cardiovascular:  Positive for chest pain.  ?Gastrointestinal:  Negative for abdominal pain, nausea and vomiting.  ?Skin:  Negative for color change.  ?Neurological:  Negative for dizziness, weakness, light-headedness, numbness and headaches.  ? ?Physical Exam ?Updated Vital Signs ?BP (!) 170/53 (BP Location: Right Arm)   Pulse 69   Temp 98.7 ?F (37.1 ?C) (Oral)   Resp 18   Ht '5\' 2"'$  (1.575 m)   Wt 54.4 kg   SpO2 90%   BMI 21.95 kg/m?  ?Physical Exam ?Vitals and nursing note reviewed.  ?Constitutional:   ?   General: She is not in acute distress. ?   Appearance: She is well-developed. She is not ill-appearing.  ?HENT:  ?   Right Ear: Tympanic membrane and ear canal normal.  ?   Left Ear: Ear canal normal.  ?   Ears:  ?   Comments: Cerumen of the left ear canal, unable to visualize TM.  No edema or erythema. ?   Mouth/Throat:  ?   Mouth: Mucous membranes are moist.  ?   Pharynx: Oropharynx is clear.  ?Eyes:  ?   Extraocular Movements: Extraocular movements intact.  ?   Conjunctiva/sclera: Conjunctivae normal.  ?Cardiovascular:  ?   Rate and Rhythm: Normal rate and regular rhythm.  ?   Pulses: Normal pulses.  ?Pulmonary:  ?   Effort: Pulmonary effort is normal. No respiratory distress.  ?   Breath sounds: Normal breath sounds. No wheezing.  ?Chest:  ?   Chest wall: No tenderness.  ?Abdominal:  ?   Palpations: Abdomen is soft.  ?   Tenderness: There is no abdominal tenderness.  ?Musculoskeletal:     ?   General: Normal range of motion.  ?   Cervical back: Normal range of motion.  ?Skin: ?   General: Skin is warm.  ?   Capillary Refill: Capillary refill takes less than 2 seconds.  ?Neurological:  ?   General: No focal deficit present.  ?   Mental Status: She is alert.  ?   Sensory: Sensation is intact. No sensory deficit.  ?   Motor: Motor function is  intact. No weakness.  ?   Comments: CN II through XII intact.  Speech clear.  No pronator drift.  ? ? ?ED Results / Procedures / Treatments   ?Labs ?(all labs ordered are listed, but only abnormal results are displayed) ?Labs Reviewed  ?BASIC METABOLIC PANEL - Abnormal; Notable for the following components:  ?    Result Value  ? Glucose, Bld 100 (*)   ? BUN 76 (*)   ? Creatinine, Ser 7.95 (*)   ? GFR, Estimated 5 (*)   ? All other components within normal limits  ?BRAIN NATRIURETIC PEPTIDE - Abnormal; Notable for the following components:  ? B Natriuretic Peptide 2,622.0 (*)   ?  All other components within normal limits  ?CBC WITH DIFFERENTIAL/PLATELET - Abnormal; Notable for the following components:  ? RBC 2.57 (*)   ? Hemoglobin 8.5 (*)   ? HCT 27.8 (*)   ? MCV 108.2 (*)   ? RDW 19.2 (*)   ? Neutro Abs 8.0 (*)   ? All other components within normal limits  ?TROPONIN I (HIGH SENSITIVITY) - Abnormal; Notable for the following components:  ? Troponin I (High Sensitivity) 96 (*)   ? All other components within normal limits  ?TROPONIN I (HIGH SENSITIVITY)  ? ? ?EKG ?EKG Interpretation ? ?Date/Time:  Sunday October 17 2021 12:07:41 EDT ?Ventricular Rate:  67 ?PR Interval:  179 ?QRS Duration: 141 ?QT Interval:  513 ?QTC Calculation: 542 ?R Axis:     ?Text Interpretation: Sinus rhythm Baseline wander Left bundle branch block since last tracing no significant change Confirmed by Daleen Bo [phone]) on 10/17/2021 12:27:51 PM ? ?Radiology ?DG Chest Port 1 View ? ?Result Date: 10/17/2021 ?CLINICAL DATA:  81 year old female with history of shortness of breath. EXAM: PORTABLE CHEST 1 VIEW COMPARISON:  Chest x-ray 09/09/2021. FINDINGS: There is cephalization of the pulmonary vasculature and slight indistinctness of the interstitial markings suggestive of mild pulmonary edema. Small bilateral pleural effusions. No pneumothorax. Mild cardiomegaly. The patient is rotated to the left on today's exam, resulting in distortion of the  mediastinal contours and reduced diagnostic sensitivity and specificity for mediastinal pathology. Atherosclerotic calcifications in the thoracic aorta. Status post median sternotomy for CABG. IMPRESSION: 1. The appearan

## 2021-10-17 NOTE — ED Notes (Signed)
Phlebotomist attempted to draw blood work again unsuccessfully x 2. States patient "jerks her arm and will not let them get it".  ?

## 2021-10-17 NOTE — ED Provider Notes (Incomplete Revision)
?  Face-to-face evaluation ? ? ?History: Patient presenting for evaluation of chest pain and shortness of breath.  She also complains of left ear pain.  She is using several eardrops, unknown type and unknown reason for this treatment.  She was dialyzed 3 days ago but did not receive a full treatment because of an episode of hypotension.  Her chest pain comes and goes.  She thinks she might be fluid overloaded ? ?Physical exam: Elderly female alert and cooperative.  She is calm comfortable.  No respiratory distress.  Abdomen soft and nontender.  Left ear canal is occluded with soft material, not clearly indicative of cerumen however it may be cerumen that has been softened with unknown type of eardrop.  Right EAC and TM are normal.  No respiratory distress.  She is calm and cooperative. ? ?MDM: Evaluation for  ?Chief Complaint  ?Patient presents with  ? Shortness of Breath  ?  ? ?Patient with nonspecific shortness of breath and chest pain.  Ear pain is incidental and likely represents EAC occlusion versus otitis media with perforation.  She has end-stage renal disease but is not in acute heart failure and there is no evidence of significant pulm edema which would require dialysis.  BUN and creatinine are somewhat higher than baseline.  Patient is stable for discharge with outpatient management, and dialysis, tomorrow. ? ?Medical screening examination/treatment/procedure(s) were conducted as a shared visit with non-physician practitioner(s) and myself.  I personally evaluated the patient during the encounter ? ?

## 2021-10-17 NOTE — ED Notes (Signed)
Blood work drawn and taken to lab.  ?

## 2021-10-17 NOTE — Discharge Instructions (Signed)
Continue to use your eardrops as prescribed.  Take the antibiotic as directed until finished.  You do have some fluid overload.  It is very important that you get your dialysis done tomorrow morning.  If you are unable to get your dialysis treatment tomorrow, please return to the emergency department ?

## 2021-10-17 NOTE — ED Triage Notes (Addendum)
Patient complaining of left ear pain for 1 week and shortness of breath that started the previous night.  ?

## 2021-10-17 NOTE — ED Provider Notes (Addendum)
?  Face-to-face evaluation ? ? ?History: Patient presenting for evaluation of chest pain and shortness of breath.  She also complains of left ear pain.  She is using several eardrops, unknown type and unknown reason for this treatment.  She was dialyzed 3 days ago but did not receive a full treatment because of an episode of hypotension.  Her chest pain comes and goes.  She thinks she might be fluid overloaded ? ?Physical exam: Elderly female alert and cooperative.  She is calm comfortable.  No respiratory distress.  Abdomen soft and nontender.  Left ear canal is occluded with soft material, not clearly indicative of cerumen however it may be cerumen that has been softened with unknown type of eardrop.  Right EAC and TM are normal.  No respiratory distress.  She is calm and cooperative. ? ?MDM: Evaluation for  ?Chief Complaint  ?Patient presents with  ? Shortness of Breath  ?  ? ?Patient with nonspecific shortness of breath and chest pain.  Ear pain is incidental and likely represents EAC occlusion versus otitis media with perforation.  She has end-stage renal disease but is not in acute heart failure and there is no evidence of significant pulm edema which would require dialysis.  BUN and creatinine are somewhat higher than baseline.  Patient is stable for discharge with outpatient management, and dialysis, tomorrow. ? ?Medical screening examination/treatment/procedure(s) were conducted as a shared visit with non-physician practitioner(s) and myself.  I personally evaluated the patient during the encounter ? ?  ?Daleen Bo, MD ?10/18/21 1727 ? ?

## 2021-10-17 NOTE — ED Notes (Signed)
Left ear irrigated. Large amount of wax removed.  ?

## 2021-10-18 ENCOUNTER — Observation Stay (HOSPITAL_COMMUNITY)
Admission: EM | Admit: 2021-10-18 | Discharge: 2021-10-20 | Disposition: A | Discharge status: Home / Self Care | Payer: Medicare HMO | Attending: Family Medicine | Admitting: Family Medicine

## 2021-10-18 ENCOUNTER — Emergency Department (HOSPITAL_COMMUNITY): Payer: Medicare HMO

## 2021-10-18 ENCOUNTER — Other Ambulatory Visit: Payer: Self-pay

## 2021-10-18 ENCOUNTER — Encounter (HOSPITAL_COMMUNITY): Payer: Self-pay

## 2021-10-18 DIAGNOSIS — Z7982 Long term (current) use of aspirin: Secondary | ICD-10-CM | POA: Insufficient documentation

## 2021-10-18 DIAGNOSIS — I120 Hypertensive chronic kidney disease with stage 5 chronic kidney disease or end stage renal disease: Secondary | ICD-10-CM | POA: Insufficient documentation

## 2021-10-18 DIAGNOSIS — I2510 Atherosclerotic heart disease of native coronary artery without angina pectoris: Secondary | ICD-10-CM | POA: Insufficient documentation

## 2021-10-18 DIAGNOSIS — N186 End stage renal disease: Secondary | ICD-10-CM | POA: Diagnosis not present

## 2021-10-18 DIAGNOSIS — I214 Non-ST elevation (NSTEMI) myocardial infarction: Secondary | ICD-10-CM | POA: Diagnosis not present

## 2021-10-18 DIAGNOSIS — Z79899 Other long term (current) drug therapy: Secondary | ICD-10-CM | POA: Insufficient documentation

## 2021-10-18 DIAGNOSIS — R079 Chest pain, unspecified: Secondary | ICD-10-CM | POA: Diagnosis present

## 2021-10-18 DIAGNOSIS — I5020 Unspecified systolic (congestive) heart failure: Secondary | ICD-10-CM | POA: Diagnosis not present

## 2021-10-18 DIAGNOSIS — Z951 Presence of aortocoronary bypass graft: Secondary | ICD-10-CM | POA: Insufficient documentation

## 2021-10-18 DIAGNOSIS — Z992 Dependence on renal dialysis: Secondary | ICD-10-CM | POA: Insufficient documentation

## 2021-10-18 LAB — CBC
HCT: 27 % — ABNORMAL LOW (ref 36.0–46.0)
Hemoglobin: 8.6 g/dL — ABNORMAL LOW (ref 12.0–15.0)
MCH: 34.1 pg — ABNORMAL HIGH (ref 26.0–34.0)
MCHC: 31.9 g/dL (ref 30.0–36.0)
MCV: 107.1 fL — ABNORMAL HIGH (ref 80.0–100.0)
Platelets: UNDETERMINED 10*3/uL (ref 150–400)
RBC: 2.52 MIL/uL — ABNORMAL LOW (ref 3.87–5.11)
RDW: 19.1 % — ABNORMAL HIGH (ref 11.5–15.5)
WBC: 7.6 10*3/uL (ref 4.0–10.5)
nRBC: 0 % (ref 0.0–0.2)

## 2021-10-18 LAB — BASIC METABOLIC PANEL
Anion gap: 13 (ref 5–15)
BUN: 30 mg/dL — ABNORMAL HIGH (ref 8–23)
CO2: 27 mmol/L (ref 22–32)
Calcium: 8.4 mg/dL — ABNORMAL LOW (ref 8.9–10.3)
Chloride: 98 mmol/L (ref 98–111)
Creatinine, Ser: 4.29 mg/dL — ABNORMAL HIGH (ref 0.44–1.00)
GFR, Estimated: 10 mL/min — ABNORMAL LOW (ref >60)
Glucose, Bld: 161 mg/dL — ABNORMAL HIGH (ref 70–99)
Potassium: 3.4 mmol/L — ABNORMAL LOW (ref 3.5–5.1)
Sodium: 138 mmol/L (ref 135–145)

## 2021-10-18 LAB — TROPONIN I (HIGH SENSITIVITY): Troponin I (High Sensitivity): 132 ng/L (ref <18)

## 2021-10-18 NOTE — ED Provider Notes (Signed)
I was brought an EKG in triage and asked to assess patient for chest pain.  Patient said that she had chest pain yesterday and had work-up that any pain but was able to go home.  She reports that this morning she had episode of chest pain and then did better after dialysis but then this evening had again pressure-like chest pain in her left central chest that radiated to her jaw and neck.  She reports that feels slightly similar to her previous MI when she had the pressure but last time it went down her arm instead of her neck.  She reports she is now chest pain-free however the EKG in triage was abnormal and concerning and was brought to me. ? ?On my evaluation the EKG does show some ST changes more pronounced than previously in the setting of bundle branch. ? ?I spoke to Dr. Daneen Schick with cardiology who is the STEMI doc tonight and he does not think this is a STEMI.  He suspect these changes are rate related.  He recommends doing a full work-up including troponins and then likely consultation with the cardiology team overnight. ? ?As patient is symptom-free and cardiology feels reassured about the EKG, will not call a STEMI and will let her continue her work-up tonight. ?  ?Suliman Termini, Gwenyth Allegra, MD ?10/18/21 2313 ? ?

## 2021-10-18 NOTE — ED Provider Triage Note (Addendum)
Emergency Medicine Provider Triage Evaluation Note ? ?Shawna [name] , a 81 y.o. female  was evaluated in triage.  Pt complains of chest pain.  Located diffusely across her chest.  She denies any shortness of breath, palpitations.  She denies prior history of PE or DVT.  Pain is not pleuritic or exertional in nature.  She is ESRD patient states she has missed a few sessions.  She does not feel she is fluid overloaded.  She was seen at [facility] yesterday for similar complaints.  No fever, cough. ? ?Review of Systems  ?Positive: Chest pain ?Negative: Shortness of breath, cough, lower extremity swelling ? ?Physical Exam  ?BP (!) 145/75 (BP Location: Right Arm)   Pulse (!) 128   Temp 98.6 ?F (37 ?C) (Oral)   Resp 20   SpO2 99%  ?Gen:   Awake, no distress   ?Cardiac: Tachycardic ?Resp:  Normal effort  ?MSK:   Moves extremities without difficulty, Compartments soft, no lower extremity pain or swelling ?Other:   ? ?Medical Decision Making  ?Medically screening exam initiated at 10:12 PM.  Appropriate orders placed.  Shawna [name] was informed that the remainder of the evaluation will be completed by another provider, this initial triage assessment does not replace that evaluation, and the importance of remaining in the ED until their evaluation is complete. ? ?ESRD patient here for evaluation of chest pain over the last few days, seen at [facility].  She does not appear significantly fluid overloaded to her lower extremities. ? ?Patient tachycardic 128. ? ?Nursing aware patient needs room in back ?  ?Reo Portela A, PA-C ?10/18/21 2215 ? ?ADDEND: Dr. Sherry Ruffing in to see patient due to EKG changes. He will consult with Cards ? ?Per tech via [place], Cards said no STEMI, get CP WU ? ?  ?  ?Melodye Swor A, PA-C ?10/18/21 2307 ? ?

## 2021-10-19 ENCOUNTER — Ambulatory Visit (INDEPENDENT_AMBULATORY_CARE_PROVIDER_SITE_OTHER): Payer: Medicare HMO | Admitting: Gastroenterology

## 2021-10-19 ENCOUNTER — Other Ambulatory Visit (HOSPITAL_COMMUNITY): Payer: Medicare HMO

## 2021-10-19 DIAGNOSIS — I214 Non-ST elevation (NSTEMI) myocardial infarction: Secondary | ICD-10-CM

## 2021-10-19 DIAGNOSIS — I5020 Unspecified systolic (congestive) heart failure: Secondary | ICD-10-CM | POA: Diagnosis not present

## 2021-10-19 DIAGNOSIS — I480 Paroxysmal atrial fibrillation: Secondary | ICD-10-CM

## 2021-10-19 LAB — BASIC METABOLIC PANEL
Anion gap: 8 (ref 5–15)
BUN: 35 mg/dL — ABNORMAL HIGH (ref 8–23)
CO2: 28 mmol/L (ref 22–32)
Calcium: 8.3 mg/dL — ABNORMAL LOW (ref 8.9–10.3)
Chloride: 103 mmol/L (ref 98–111)
Creatinine, Ser: 4.89 mg/dL — ABNORMAL HIGH (ref 0.44–1.00)
GFR, Estimated: 8 mL/min — ABNORMAL LOW (ref >60)
Glucose, Bld: 92 mg/dL (ref 70–99)
Potassium: 3.6 mmol/L (ref 3.5–5.1)
Sodium: 139 mmol/L (ref 135–145)

## 2021-10-19 LAB — LIPID PANEL
Cholesterol: 81 mg/dL (ref 0–200)
HDL: 37 mg/dL — ABNORMAL LOW (ref >40)
LDL Cholesterol: 35 mg/dL (ref 0–99)
Total CHOL/HDL Ratio: 2.2 RATIO
Triglycerides: 46 mg/dL (ref <150)
VLDL: 9 mg/dL (ref 0–40)

## 2021-10-19 LAB — CBC
HCT: 25.2 % — ABNORMAL LOW (ref 36.0–46.0)
Hemoglobin: 8 g/dL — ABNORMAL LOW (ref 12.0–15.0)
MCH: 34.3 pg — ABNORMAL HIGH (ref 26.0–34.0)
MCHC: 31.7 g/dL (ref 30.0–36.0)
MCV: 108.2 fL — ABNORMAL HIGH (ref 80.0–100.0)
Platelets: UNDETERMINED 10*3/uL (ref 150–400)
RBC: 2.33 MIL/uL — ABNORMAL LOW (ref 3.87–5.11)
RDW: 19.1 % — ABNORMAL HIGH (ref 11.5–15.5)
WBC: 6.2 10*3/uL (ref 4.0–10.5)
nRBC: 0 % (ref 0.0–0.2)

## 2021-10-19 LAB — TROPONIN I (HIGH SENSITIVITY)
Troponin I (High Sensitivity): 132 ng/L (ref <18)
Troponin I (High Sensitivity): 130 ng/L (ref <18)
Troponin I (High Sensitivity): 126 ng/L (ref <18)

## 2021-10-19 LAB — HEPARIN LEVEL (UNFRACTIONATED): Heparin Unfractionated: 0.21 IU/mL — ABNORMAL LOW (ref 0.30–0.70)

## 2021-10-19 LAB — GLUCOSE, CAPILLARY
Glucose-Capillary: 140 mg/dL — ABNORMAL HIGH (ref 70–99)
Glucose-Capillary: 152 mg/dL — ABNORMAL HIGH (ref 70–99)

## 2021-10-19 LAB — MRSA NEXT GEN BY PCR, NASAL: MRSA by PCR Next Gen: NOT DETECTED

## 2021-10-19 LAB — TSH: TSH: 4.203 u[IU]/mL (ref 0.350–4.500)

## 2021-10-19 LAB — CBG MONITORING, ED: Glucose-Capillary: 82 mg/dL (ref 70–99)

## 2021-10-19 MED ORDER — RENA-VITE PO TABS
1.0000 | ORAL_TABLET | Freq: Every day | ORAL | Status: DC
  Administered 2021-10-19 – 2021-10-20 (×2): 1 via ORAL
  Filled 2021-10-19 (×3): qty 1

## 2021-10-19 MED ORDER — NEOMYCIN-POLYMYXIN-HC 3.5-10000-1 OT SUSP
3.0000 [drp] | Freq: Four times a day (QID) | OTIC | Status: DC
  Filled 2021-10-19: qty 10

## 2021-10-19 MED ORDER — AMIODARONE HCL 200 MG PO TABS
100.0000 mg | ORAL_TABLET | Freq: Every day | ORAL | Status: DC
  Administered 2021-10-19 – 2021-10-20 (×2): 100 mg via ORAL
  Filled 2021-10-19 (×2): qty 1

## 2021-10-19 MED ORDER — PANTOPRAZOLE SODIUM 40 MG PO TBEC
40.0000 mg | DELAYED_RELEASE_TABLET | Freq: Every day | ORAL | Status: DC
  Administered 2021-10-19 – 2021-10-20 (×2): 40 mg via ORAL
  Filled 2021-10-19 (×2): qty 1

## 2021-10-19 MED ORDER — CEFDINIR 300 MG PO CAPS
300.0000 mg | ORAL_CAPSULE | ORAL | Status: DC
  Administered 2021-10-20: 300 mg via ORAL
  Filled 2021-10-19 (×2): qty 1

## 2021-10-19 MED ORDER — LEVOTHYROXINE SODIUM 25 MCG PO TABS
25.0000 ug | ORAL_TABLET | Freq: Every day | ORAL | Status: DC
  Administered 2021-10-19 – 2021-10-20 (×2): 25 ug via ORAL
  Filled 2021-10-19 (×2): qty 1

## 2021-10-19 MED ORDER — NITROGLYCERIN 0.4 MG SL SUBL: 0.4000 mg | SUBLINGUAL_TABLET | SUBLINGUAL | Status: DC | PRN

## 2021-10-19 MED ORDER — LIDOCAINE-PRILOCAINE 2.5-2.5 % EX CREA: 1.0000 "application " | TOPICAL_CREAM | CUTANEOUS | Status: DC

## 2021-10-19 MED ORDER — ONDANSETRON HCL 4 MG/2ML IJ SOLN: 4.0000 mg | Freq: Four times a day (QID) | INTRAMUSCULAR | Status: DC | PRN

## 2021-10-19 MED ORDER — ROSUVASTATIN CALCIUM 5 MG PO TABS
10.0000 mg | ORAL_TABLET | Freq: Every day | ORAL | Status: DC
  Administered 2021-10-19 – 2021-10-20 (×2): 10 mg via ORAL
  Filled 2021-10-19 (×2): qty 2

## 2021-10-19 MED ORDER — SEVELAMER CARBONATE 800 MG PO TABS
2400.0000 mg | ORAL_TABLET | Freq: Three times a day (TID) | ORAL | Status: DC
  Administered 2021-10-19: 2400 mg via ORAL
  Filled 2021-10-19 (×2): qty 3

## 2021-10-19 MED ORDER — OXYCODONE HCL 5 MG PO TABS
5.0000 mg | ORAL_TABLET | Freq: Four times a day (QID) | ORAL | Status: DC | PRN
  Administered 2021-10-19 – 2021-10-20 (×3): 5 mg via ORAL
  Filled 2021-10-19 (×3): qty 1

## 2021-10-19 MED ORDER — ASPIRIN EC 81 MG PO TBEC
81.0000 mg | DELAYED_RELEASE_TABLET | Freq: Every day | ORAL | Status: DC
  Administered 2021-10-19 – 2021-10-20 (×2): 81 mg via ORAL
  Filled 2021-10-19 (×2): qty 1

## 2021-10-19 MED ORDER — HEPARIN (PORCINE) 25000 UT/250ML-% IV SOLN
650.0000 [IU]/h | INTRAVENOUS | Status: DC
Start: 2021-10-19 — End: 2021-10-19
  Administered 2021-10-19: 650 [IU]/h via INTRAVENOUS
  Filled 2021-10-19: qty 250

## 2021-10-19 MED ORDER — METOPROLOL SUCCINATE ER 25 MG PO TB24
25.0000 mg | ORAL_TABLET | Freq: Every day | ORAL | Status: DC
  Administered 2021-10-19 – 2021-10-20 (×2): 25 mg via ORAL
  Filled 2021-10-19 (×2): qty 1

## 2021-10-19 MED ORDER — CHLORHEXIDINE GLUCONATE CLOTH 2 % EX PADS: 6.0000 | MEDICATED_PAD | Freq: Every day | CUTANEOUS | Status: DC

## 2021-10-19 MED ORDER — GABAPENTIN 100 MG PO CAPS
100.0000 mg | ORAL_CAPSULE | Freq: Two times a day (BID) | ORAL | Status: DC
  Administered 2021-10-19 – 2021-10-20 (×4): 100 mg via ORAL
  Filled 2021-10-19 (×4): qty 1

## 2021-10-19 MED ORDER — ALPRAZOLAM 0.5 MG PO TABS
0.5000 mg | ORAL_TABLET | Freq: Every day | ORAL | Status: DC
  Administered 2021-10-19 – 2021-10-20 (×2): 0.5 mg via ORAL
  Filled 2021-10-19 (×2): qty 1

## 2021-10-19 MED ORDER — NEOMYCIN-POLYMYXIN-HC 3.5-10000-1 OT SUSP
3.0000 [drp] | Freq: Four times a day (QID) | OTIC | Status: DC
  Administered 2021-10-19: 3 [drp] via OTIC
  Filled 2021-10-19 (×2): qty 10

## 2021-10-19 MED ORDER — HYDROXYZINE HCL 50 MG PO TABS
100.0000 mg | ORAL_TABLET | Freq: Every day | ORAL | Status: DC | PRN
  Filled 2021-10-19: qty 2

## 2021-10-19 MED ORDER — HEPARIN BOLUS VIA INFUSION
3000.0000 [IU] | Freq: Once | INTRAVENOUS | Status: AC
  Administered 2021-10-19: 3000 [IU] via INTRAVENOUS
  Filled 2021-10-19: qty 3000

## 2021-10-19 MED ORDER — MIDODRINE HCL 5 MG PO TABS: 5.0000 mg | ORAL_TABLET | ORAL | Status: DC

## 2021-10-19 NOTE — Progress Notes (Signed)
Patient admitted after midnight, please see H&P.  Here with chest pain- thought to be due to shifts of fluid at HD. Cards and Nephrology consulted.  Diet ordered and heparin d/c'd.  Plan for HD in the AM and ReDS vest reading. ?Eulogio Bear DO ?

## 2021-10-19 NOTE — Consult Note (Signed)
?Cardiology Consultation:  ? ?Patient ID: Shawna [name] ?MRN: [id number]; DOB: 20-Sep-1940 ? ?Admit date: 10/18/2021 ?Date of Consult: 10/19/2021 ? ?PCP:  [facility] ?  ?[facility] Prov[facility] ?Cardiologist:  Carlyle Dolly, MD      ? ? ?Patient Profile:  ? ?Shawna [name] is a 81 y.o. female with a hx of CAD s/p CABG (LIMA-LAD, SVG-OM, RI and distal RCA in 2013, HFrEF, HTN, HLD, paroxysmal atrial fibrillation (not on anticoagulation given history of GIB), NSVT, chronic anemia and ESRD on MWF who is being seen 10/19/2021 for the evaluation of chest pain at the request of Shawna [name]. ? ?History of Present Illness:  ? ?Shawna [name] is known to our service and has chronic chest pain. Pt appears sleepy during my interview, and her husband at bedside provided majority of her history. She went to [facility] ED on 4/9 with c/o chest pain and shortness of breath and left ear pain. It appears she did not receive her full dialysis last Friday due to a drop in her blood pressure. It was felt she was fluid overload. She was discharged and received a full dialysis 4/10. She came to our ED with c/o chest pain. Reports mid-chest dull pain without radiation associated with mild SOB. The pain was improved after dialysis however came back last night with mid-chest pressure like feeling radiating to her jaw and neck. She says all these episodes appear to be similar to the chest pain she had prior to her CABG and stents placement. Her husband says the patient has not sleeping well for 2 days that is why she is sleepy. Denies fever, chills, syncope, heart palpitations, nausea, vomiting, PND, dysuria, diarrhea, pedal edema or any recent bleeding events. Currently she is chest pain free, no acute distress. ? ?Of note, ED provider discussed with Dr. Tamala Julian earlierwith concerns of one of her EKGs. It was determinated not a STEMI.  ? ?On admission ?K 3.4, Cr 4.29 (was 7.95 4/9), troponin 132 (baseline ~30),Hgb 8.6 ? ?Past Medical  History:  ?Diagnosis Date  ? Acute on chronic respiratory failure with hypoxia ([place]) 10/10/2016  ? Anxiety   ? Arthritis   ? AVM (arteriovenous malformation) of colon   ? CAD (coronary artery disease)   ? a. s/p CABG in 2013 b. DES to D1 in 10/2016. c. cath in 07/2018 showing patent grafts with occlusion of D1 at prior stent site and progression of PDA disease --> medical management recommended  ? Carotid artery disease ([place])   ? a. 40-98% LICA, 07/1912   ? Chronic anemia   ? Chronic bronchitis ([place])   ? Chronic diastolic CHF (congestive heart failure) ([place])   ? a. 02/2012 Echo EF 60-65%, nl wall motion, Gr 1 DD, mod MR  ? Colon cancer ([place]) 1992  ? Esophageal stricture   ? ESRD on hemodialysis ([place])   ? ESRD due to HTN, started dialysis 2011 and gets HD at [facility] with Dr Hinda Lenis on MWF schedule.  Access is LUA AVF as of Sept 2014.   ? GERD (gastroesophageal reflux disease)   ? High cholesterol 12/2011  ? History of blood transfusion 07/2011; 12/2011; 01/2012 X 2; 04/2012  ? History of gout   ? History of lower GI bleeding   ? Hypertension   ? Iron deficiency anemia   ? Jugular vein occlusion, right ([place])   ? Mitral regurgitation   ? a. Moderate by echo, 02/2012  ? Myocardial infarction [facility])   ? NSVT (nonsustained  ventricular tachycardia) ([place])   ? Ovarian cancer ([place]) 1992  ? PAF (paroxysmal atrial fibrillation) ([place])   ? Pneumonia ~ 2009  ? PUD (peptic ulcer disease)   ? TIA (transient ischemic attack)   ? ? ?Past Surgical History:  ?Procedure Laterality Date  ? A/V SHUNTOGRAM Left 03/19/2019  ? Procedure: A/V SHUNTOGRAM;  Surgeon: Katha Cabal, MD;  Location: [place] [facility];  Service: Cardiovascular;  Laterality: Left;  ? ABDOMINAL HYSTERECTOMY  1992  ? APPENDECTOMY  06/1990  ? AV FISTULA PLACEMENT  07/2009  ? left upper arm  ? AV FISTULA PLACEMENT Right 09/06/2016  ? Procedure: RIGHT FOREARM ARTERIOVENOUS (AV) GRAFT;  Surgeon: Elam Dutch, MD;  Location: [place];  Service: Vascular;  Laterality:  Right;  ? AV FISTULA PLACEMENT N/A 02/24/2017  ? Procedure: INSERTION OF ARTERIOVENOUS (AV) GORE-TEX GRAFT ARM (BRACHIAL AXILLARY);  Surgeon: Katha Cabal, MD;  Location: [facility];  Service: Vascular;  Laterality: N/A;  ? [place] REMOVAL Right 09/06/2016  ? Procedure: REMOVAL OF Right Arm ARTERIOVENOUS GORETEX GRAFT and Vein Patch angioplasty of brachial artery;  Surgeon: Angelia Mould, MD;  Location: [place];  Service: Vascular;  Laterality: Right;  ? BIOPSY  09/26/2019  ? Procedure: BIOPSY;  Surgeon: Rogene Houston, MD;  [facility];  Service: Endoscopy;;  ? COLON RESECTION  1992  ? COLON SURGERY    ? COLONOSCOPY N/A 03/09/2019  ? Procedure: COLONOSCOPY;  Surgeon: Rogene Houston, MD;  [facility];  Service: Endoscopy;  Laterality: N/A;  ? COLONOSCOPY WITH PROPOFOL N/A 06/08/2021  ? Procedure: COLONOSCOPY WITH PROPOFOL;  Surgeon: Harvel Quale, MD;  [facility];  Service: Gastroenterology;  Laterality: N/A;  9:05 /Patient is on dialysis Mon Wed Fri  ? CORONARY ANGIOPLASTY WITH STENT PLACEMENT  12/15/11  ? "2"  ? CORONARY ANGIOPLASTY WITH STENT PLACEMENT  y/2013  ? "1; makes total of 3" (05/02/2012)  ? CORONARY ARTERY BYPASS GRAFT  06/13/2012  ? Procedure: CORONARY ARTERY BYPASS GRAFTING (CABG);  Surgeon: Grace Isaac, MD;  Location: [place];  Service: Open Heart Surgery;  Laterality: N/A;  cabg x four;  using left internal mammary artery, and left leg greater saphenous vein harvested endoscopically  ? CORONARY STENT INTERVENTION N/A 10/13/2016  ? Procedure: Coronary Stent Intervention;  Surgeon: Troy Sine, MD;  Location: [place] [facility];  Service: Cardiovascular;  Laterality: N/A;  ? DIALYSIS/PERMA CATHETER REMOVAL N/A 04/18/2017  ? Procedure: DIALYSIS/PERMA CATHETER REMOVAL;  Surgeon: Katha Cabal, MD;  Location: [place] [facility];  Service: Cardiovascular;  Laterality: N/A;  ? DILATION AND CURETTAGE OF UTERUS    ? ENTEROSCOPY N/A 06/08/2021  ?  Procedure: PUSH ENTEROSCOPY;  Surgeon: Harvel Quale, MD;  [facility];  Service: Gastroenterology;  Laterality: N/A;  ? ESOPHAGOGASTRODUODENOSCOPY  01/20/2012  ? Procedure: ESOPHAGOGASTRODUODENOSCOPY (EGD);  Surgeon: Ladene Artist, MD,FACG;  Location: [facility];  Service: Endoscopy;  Laterality: N/A;  ? ESOPHAGOGASTRODUODENOSCOPY N/A 03/26/2013  ? Procedure: ESOPHAGOGASTRODUODENOSCOPY (EGD);  Surgeon: Irene Shipper, MD;  Location: [facility] END[facility]COPY;  Service: Endoscopy;  Laterality: N/A;  ? ESOPHAGOGASTRODUODENOSCOPY N/A 04/30/2015  ? Procedure: ESOPHAGOGASTRODUODENOSCOPY (EGD);  Surgeon: Rogene Houston, MD;  [facility];  Service: Endoscopy;  Laterality: N/A;  1pm - moved to 10/20 @ 1:10  ? ESOPHAGOGASTRODUODENOSCOPY N/A 07/29/2016  ? Procedure: ESOPHAGOGASTRODUODENOSCOPY (EGD);  Surgeon: Manus Gunning, MD;  Location: [place];  Service: Gastroenterology;  Laterality: N/A;  enteroscopy  ?  ESOPHAGOGASTRODUODENOSCOPY N/A 09/26/2019  ? Procedure: ESOPHAGOGASTRODUODENOSCOPY (EGD);  Surgeon: Rogene Houston, MD;  [facility];  Service: Endoscopy;  Laterality: N/A;  1250  ? ESOPHAGOGASTRODUODENOSCOPY (EGD) WITH PROPOFOL N/A 02/05/2021  ? Procedure: ESOPHAGOGASTRODUODENOSCOPY (EGD) WITH PROPOFOL;  Surgeon: Eloise Harman, DO;  [facility];  Service: Endoscopy;  Laterality: N/A;  ? GIVENS CAPSULE STUDY N/A 03/07/2019  ? Procedure: GIVENS CAPSULE STUDY;  Surgeon: Rogene Houston, MD;  [facility];  Service: Endoscopy;  Laterality: N/A;  7:30  ? GIVENS CAPSULE STUDY N/A 04/22/2021  ? Procedure: GIVENS CAPSULE STUDY;  Surgeon: Rogene Houston, MD;  [facility];  Service: Endoscopy;  Laterality: N/A;  7:30  ? INSERTION OF DIALYSIS CATHETER N/A 10/05/2020  ? Procedure: ABORTED TUNNELED DIALYSIS CATHETER PLACEMENT RIGHT INTERNAL JUGULAR VEIN ;  Surgeon: Virl Cagey, MD;  [facility];  Service: General;  Laterality: N/A;   ? INTRAOPERATIVE TRANSESOPHAGEAL ECHOCARDIOGRAM  06/13/2012  ? Procedure: INTRAOPERATIVE TRANSESOPHAGEAL ECHOCARDIOGRAM;  Surgeon: Grace Isaac, MD;  Location: [place];  Service: Open Heart Surgery;  L

## 2021-10-19 NOTE — H&P (Signed)
?History and Physical  ? ? ?Shawna [name] QIW:[id number] DOB: Aug 02, 1940 DOA: 10/18/2021 ? ?PCP: [facility]  ?Patient coming from: home ? ?I have personally briefly reviewed patient's old medical records in [place] ? ?Chief Complaint: chest pain  ? ?HPI: Shawna [name] is a 81 y.o. female with medical history significant of a 81 y.o. female with a hx of CAD s/p CABG ,HFrEF, HTN, HLD, paroxysmal atrial fibrillation not on anticoagulation due to history of GIB, chronic anemia  as well as ESRD on MWF  she presents to the ED with CC of  CP. Patient initially presented 2 day ago to [facility] with complaint of left air pain, chest pain  and sob, on evaluation patient was noted to have  bump in baseline ce from 30's to 90's but this was thought to be  related to fluid overload and not acs as patient had not completed full HD session 2 days prior due to drop in her BP. She was discharged from ED as EKG was stable and her symptoms had resolved. She now returns with more frequent and severe  left chest pressure associated  with left jaw pain. Per patient chest pain would last around 15-30 minutes and is similar to pain she had prior to her MI's. She  notes no exacerbating or alleviating symptoms. She had recent  Left heart cath 1/23  which noted chronic findings and noting all her grafts were  patent. Patient on ros noted no f/cn/v/d/no current sob/ and is currently chest pain free.  Patient seen by cardiology in ED due to EKG concerning for ST changes, patient diagnosed with NSTEMI and started on ACS protocol.   ? ?ED Course:  ?Afeb, bp 145/75, hr 128, rrr 20 ,sat 99%  ?Labs: wbc:7.6, hgb 8.6 around baseline, mcv 107,  ?K 3.4, cr 4.29 bicar27 ?ERD408 ,132 ?EKG: sinus tachycardia with LVH with early repolarization and nonspecfic t wave changes ?XKG:YJE ?COPD with minimal basilar scarring. ?  ?Review of Systems: As per HPI otherwise 10 point review of systems negative.  ? ?Past Medical History:  ?Diagnosis  Date  ? Acute on chronic respiratory failure with hypoxia ([place]) 10/10/2016  ? Anxiety   ? Arthritis   ? AVM (arteriovenous malformation) of colon   ? CAD (coronary artery disease)   ? a. s/p CABG in 2013 b. DES to D1 in 10/2016. c. cath in 07/2018 showing patent grafts with occlusion of D1 at prior stent site and progression of PDA disease --> medical management recommended  ? Carotid artery disease ([place])   ? a. 56-31% LICA, 10/9700   ? Chronic anemia   ? Chronic bronchitis ([place])   ? Chronic diastolic CHF (congestive heart failure) ([place])   ? a. 02/2012 Echo EF 60-65%, nl wall motion, Gr 1 DD, mod MR  ? Colon cancer ([place]) 1992  ? Esophageal stricture   ? ESRD on hemodialysis ([place])   ? ESRD due to HTN, started dialysis 2011 and gets HD at [facility] with Dr Hinda Lenis on MWF schedule.  Access is LUA AVF as of Sept 2014.   ? GERD (gastroesophageal reflux disease)   ? High cholesterol 12/2011  ? History of blood transfusion 07/2011; 12/2011; 01/2012 X 2; 04/2012  ? History of gout   ? History of lower GI bleeding   ? Hypertension   ? Iron deficiency anemia   ? Jugular vein occlusion, right ([place])   ? Mitral regurgitation   ? a. Moderate by  echo, 02/2012  ? Myocardial infarction [facility])   ? NSVT (nonsustained ventricular tachycardia) ([place])   ? Ovarian cancer ([place]) 1992  ? PAF (paroxysmal atrial fibrillation) ([place])   ? Pneumonia ~ 2009  ? PUD (peptic ulcer disease)   ? TIA (transient ischemic attack)   ? ? ?Past Surgical History:  ?Procedure Laterality Date  ? A/V SHUNTOGRAM Left 03/19/2019  ? Procedure: A/V SHUNTOGRAM;  Surgeon: Katha Cabal, MD;  Location: [place] [facility];  Service: Cardiovascular;  Laterality: Left;  ? ABDOMINAL HYSTERECTOMY  1992  ? APPENDECTOMY  06/1990  ? AV FISTULA PLACEMENT  07/2009  ? left upper arm  ? AV FISTULA PLACEMENT Right 09/06/2016  ? Procedure: RIGHT FOREARM ARTERIOVENOUS (AV) GRAFT;  Surgeon: Elam Dutch, MD;  Location: [place];  Service: Vascular;  Laterality: Right;  ? AV FISTULA  PLACEMENT N/A 02/24/2017  ? Procedure: INSERTION OF ARTERIOVENOUS (AV) GORE-TEX GRAFT ARM (BRACHIAL AXILLARY);  Surgeon: Katha Cabal, MD;  Location: [facility];  Service: Vascular;  Laterality: N/A;  ? [name] REMOVAL Right 09/06/2016  ? Procedure: REMOVAL OF Right Arm ARTERIOVENOUS GORETEX GRAFT and Vein Patch angioplasty of brachial artery;  Surgeon: Angelia Mould, MD;  Location: [place];  Service: Vascular;  Laterality: Right;  ? BIOPSY  09/26/2019  ? Procedure: BIOPSY;  Surgeon: Rogene Houston, MD;  [facility];  Service: Endoscopy;;  ? COLON RESECTION  1992  ? COLON SURGERY    ? COLONOSCOPY N/A 03/09/2019  ? Procedure: COLONOSCOPY;  Surgeon: Rogene Houston, MD;  [facility];  Service: Endoscopy;  Laterality: N/A;  ? COLONOSCOPY WITH PROPOFOL N/A 06/08/2021  ? Procedure: COLONOSCOPY WITH PROPOFOL;  Surgeon: Harvel Quale, MD;  [facility];  Service: Gastroenterology;  Laterality: N/A;  9:05 /Patient is on dialysis Mon Wed Fri  ? CORONARY ANGIOPLASTY WITH STENT PLACEMENT  12/15/11  ? "2"  ? CORONARY ANGIOPLASTY WITH STENT PLACEMENT  y/2013  ? "1; makes total of 3" (05/02/2012)  ? CORONARY ARTERY BYPASS GRAFT  06/13/2012  ? Procedure: CORONARY ARTERY BYPASS GRAFTING (CABG);  Surgeon: Grace Isaac, MD;  Location: [place];  Service: Open Heart Surgery;  Laterality: N/A;  cabg x four;  using left internal mammary artery, and left leg greater saphenous vein harvested endoscopically  ? CORONARY STENT INTERVENTION N/A 10/13/2016  ? Procedure: Coronary Stent Intervention;  Surgeon: Troy Sine, MD;  Location: [place] [facility];  Service: Cardiovascular;  Laterality: N/A;  ? DIALYSIS/PERMA CATHETER REMOVAL N/A 04/18/2017  ? Procedure: DIALYSIS/PERMA CATHETER REMOVAL;  Surgeon: Katha Cabal, MD;  Location: [place] [facility];  Service: Cardiovascular;  Laterality: N/A;  ? DILATION AND CURETTAGE OF UTERUS    ? ENTEROSCOPY N/A 06/08/2021  ? Procedure: PUSH  ENTEROSCOPY;  Surgeon: Harvel Quale, MD;  [facility];  Service: Gastroenterology;  Laterality: N/A;  ? ESOPHAGOGASTRODUODENOSCOPY  01/20/2012  ? Procedure: ESOPHAGOGASTRODUODENOSCOPY (EGD);  Surgeon: Ladene Artist, MD,FACG;  Location: [facility] ENDOSCOPY;  Service: Endoscopy;  Laterality: N/A;  ? ESOPHAGOGASTRODUODENOSCOPY N/A 03/26/2013  ? Procedure: ESOPHAGOGASTRODUODENOSCOPY (EGD);  Surgeon: Irene Shipper, MD;  Location: [facility] ENDOSCOPY;  Service: Endoscopy;  Laterality: N/A;  ? ESOPHAGOGASTRODUODENOSCOPY N/A 04/30/2015  ? Procedure: ESOPHAGOGASTRODUODENOSCOPY (EGD);  Surgeon: Rogene Houston, MD;  [facility];  Service: Endoscopy;  Laterality: N/A;  1pm - moved to 10/20 @ 1:10  ? ESOPHAGOGASTRODUODENOSCOPY N/A 07/29/2016  ? Procedure: ESOPHAGOGASTRODUODENOSCOPY (EGD);  Surgeon: Manus Gunning, MD;  [facility];  Service: Gastroenterology;  Laterality: N/A;  enteroscopy  ? ESOPHAGOGASTRODUODENOSCOPY N/A 09/26/2019  ? Procedure: ESOPHAGOGASTRODUODENOSCOPY (EGD);  Surgeon: Rogene Houston, MD;  [facility];  Service: Endoscopy;  Laterality: N/A;  1250  ? ESOPHAGOGASTRODUODENOSCOPY (EGD) WITH PROPOFOL N/A 02/05/2021  ? Procedure: ESOPHAGOGASTRODUODENOSCOPY (EGD) WITH PROPOFOL;  Surgeon: Eloise Harman, DO;  [facility];  Service: Endoscopy;  Laterality: N/A;  ? GIVENS CAPSULE STUDY N/A 03/07/2019  ? Procedure: GIVENS CAPSULE STUDY;  Surgeon: Rogene Houston, MD;  [facility];  Service: Endoscopy;  Laterality: N/A;  7:30  ? GIVENS CAPSULE STUDY N/A 04/22/2021  ? Procedure: GIVENS CAPSULE STUDY;  Surgeon: Rogene Houston, MD;  [facility];  Service: Endoscopy;  Laterality: N/A;  7:30  ? INSERTION OF DIALYSIS CATHETER N/A 10/05/2020  ? Procedure: ABORTED TUNNELED DIALYSIS CATHETER PLACEMENT RIGHT INTERNAL JUGULAR VEIN ;  Surgeon: Virl Cagey, MD;  [facility];  Service: General;  Laterality: N/A;  ? INTRAOPERATIVE  TRANSESOPHAGEAL ECHOCARDIOGRAM  06/13/2012  ? Procedure: INTRAOPERATIVE TRANSESOPHAGEAL ECHOCARDIOGRAM;  Surgeon: Grace Isaac, MD;  Location: [place];  Service: Open Heart Surgery;  Laterality: N/A;  ? IR

## 2021-10-19 NOTE — Progress Notes (Addendum)
? ?Progress Note ? ?Patient Name: Shawna [name] ?Date of Encounter: 10/19/2021 ? ?[facility] Cardiologist: Carlyle Dolly, MD  ? ?Subjective  ? ?Patient states she no longer has chest pain, feeling well. She still has left ear pain, states it has been a week. She reports chronic chest pain on and off, last night pain was pretty bad and therefore came to ED. Husband states her BP is always low at dialysis where they could not take off enough fluid frequently. ? ?Inpatient Medications  ?  ?Scheduled Meds: ? amiodarone  100 mg Oral Daily  ? aspirin EC  81 mg Oral Daily  ? cefdinir  300 mg Oral Q M,W,F-HD  ? levothyroxine  25 mcg Oral Daily  ? [START ON 10/20/2021] lidocaine-prilocaine  1 application. Topical Q M,W,F-HD  ? metoprolol succinate  25 mg Oral Daily  ? [START ON 10/20/2021] midodrine  5-10 mg Oral Q M,W,F-HD  ? multivitamin  1 tablet Oral QHS  ? pantoprazole  40 mg Oral Daily  ? rosuvastatin  10 mg Oral Daily  ? sevelamer carbonate  2,400 mg Oral TID WC  ? ?Continuous Infusions: ? heparin 650 Units/hr (10/19/21 0149)  ? ?PRN Meds: ?nitroGLYCERIN, ondansetron (ZOFRAN) IV  ? ?Vital Signs  ?  ?Vitals:  ? 10/19/21 0757 10/19/21 0758 10/19/21 0800 10/19/21 0830  ?BP:   (!) 155/51 (!) 136/46  ?Pulse:   (!) 58 (!) 57  ?Resp:   15 (!) 21  ?Temp: 97.6 ?F (36.4 ?C)     ?TempSrc: Oral     ?SpO2:   99% 98%  ?Weight:  56.2 kg    ?Height:  '5\' 2"'$  (1.575 m)    ? ?No intake or output data in the 24 hours ending 10/19/21 0943 ? ?  10/19/2021  ?  7:58 AM 10/17/2021  ? 11:09 AM 09/10/2021  ?  2:30 PM  ?Last 3 Weights  ?Weight (lbs) 124 lb 120 lb 123 lb 10.9 oz  ?Weight (kg) 56.246 kg 54.432 kg 56.1 kg  ?   ? ?Telemetry  ?  ?Sinus bradycardia /rhythm 59-65 bpm - Personally Reviewed ? ?ECG  ?  ?EKG repeat ordered this AM, previous EKG lead reversal - Personally Reviewed ? ?Physical Exam  ? ?GEN: No acute distress.   ?Neck: No JVD ?Cardiac: RRR, no murmurs, rubs, or gallops.  ?Respiratory: Clear to auscultation bilaterally. On room  air.  ?GI: Soft, nontender, non-distended  ?MS: No BLE edema; No deformity. ?Neuro:  Nonfocal  ?Psych: Normal affect  ?Left arm fistula + thrill and bruit.  ? ?Labs  ?  ?High Sensitivity Troponin:   ?Recent Labs  ?Lab 10/17/21 ?1746 10/18/21 ?2217 10/19/21 ?0008 10/19/21 ?4827 10/19/21 ?0786  ?TROPONINIHS 96* 132* 132* 130* 126*  ?   ?Chemistry ?Recent Labs  ?Lab 10/17/21 ?1746 10/18/21 ?2217 10/19/21 ?0357  ?NA 141 138 139  ?K 4.6 3.4* 3.6  ?CL 103 98 103  ?CO2 '24 27 28  '$ ?GLUCOSE 100* 161* 92  ?BUN 76* 30* 35*  ?CREATININE 7.95* 4.29* 4.89*  ?CALCIUM 8.9 8.4* 8.3*  ?GFRNONAA 5* 10* 8*  ?ANIONGAP '14 13 8  '$ ?  ?Lipids  ?Recent Labs  ?Lab 10/19/21 ?0008  ?CHOL 81  ?TRIG 46  ?HDL 37*  ?Rock 35  ?CHOLHDL 2.2  ?  ?Hematology ?Recent Labs  ?Lab 10/17/21 ?1746 10/18/21 ?2217 10/19/21 ?0357  ?WBC 10.1 7.6 6.2  ?RBC 2.57* 2.52* 2.33*  ?HGB 8.5* 8.6* 8.0*  ?HCT 27.8* 27.0* 25.2*  ?MCV 108.2* 107.1* 108.2*  ?  MCH 33.1 34.1* 34.3*  ?MCHC 30.6 31.9 31.7  ?RDW 19.2* 19.1* 19.1*  ?PLT PLATELET CLUMPS NOTED ON SMEAR, COUNT APPEARS ADEQUATE PLATELET CLUMPS NOTED ON SMEAR, UNABLE TO ESTIMATE PLATELET CLUMPS NOTED ON SMEAR, UNABLE TO ESTIMATE  ? ?Thyroid No results for input(s): TSH, FREET4 in the last 168 hours.  ?BNP ?Recent Labs  ?Lab 10/17/21 ?1746  ?BNP 2,622.0*  ?  ?DDimer No results for input(s): DDIMER in the last 168 hours.  ? ?Radiology  ?  ?DG Chest 2 View ? ?Result Date: 10/18/2021 ?CLINICAL DATA:  Chest pain EXAM: CHEST - 2 VIEW COMPARISON:  10/17/2021 FINDINGS: Cardiac shadow is mildly enlarged but stable. Postsurgical changes are noted. The lungs are hyperinflated but clear. Minimal scarring is noted in the left base stable from the prior study. No acute bony abnormality is noted. Vascular stent is noted in the left upper arm. IMPRESSION: COPD with minimal basilar scarring. Electronically Signed   By: Inez Catalina M.D.   On: 10/18/2021 23:02  ? ?DG Chest Port 1 View ? ?Result Date: 10/17/2021 ?CLINICAL DATA:  81 year old  female with history of shortness of breath. EXAM: PORTABLE CHEST 1 VIEW COMPARISON:  Chest x-ray 09/09/2021. FINDINGS: There is cephalization of the pulmonary vasculature and slight indistinctness of the interstitial markings suggestive of mild pulmonary edema. Small bilateral pleural effusions. No pneumothorax. Mild cardiomegaly. The patient is rotated to the left on today's exam, resulting in distortion of the mediastinal contours and reduced diagnostic sensitivity and specificity for mediastinal pathology. Atherosclerotic calcifications in the thoracic aorta. Status post median sternotomy for CABG. IMPRESSION: 1. The appearance the chest suggests mild congestive heart failure, as above. 2. Aortic atherosclerosis. Electronically Signed   By: Vinnie Langton M.D.   On: 10/17/2021 12:35   ? ?Cardiac Studies  ? ?LHC from 07/22/21: ? ?Mid LM to Dist LM lesion is 60% stenosed. ?  Prox LAD lesion is 90% stenosed. ?  Ost Cx to Mid Cx lesion is 99% stenosed. ?  Ost RCA to Prox RCA lesion is 100% stenosed. ?  Ost Ramus to Ramus lesion is 80% stenosed. ?  Ost 1st Diag to 1st Diag lesion is 100% stenosed. ?  Ost RPDA to RPDA lesion is 75% stenosed. ?  2nd Mrg lesion is 60% stenosed. ?  Dist LAD lesion is 70% stenosed. ?  LIMA and is large. ?  SVG and is large. ?  SVG and is normal in caliber. ?  SVG and is large. ?  The graft exhibits no disease. ?  The graft exhibits no disease. ?  The graft exhibits no disease. ?  LV end diastolic pressure is mildly elevated. ?  The left ventricular ejection fraction is 35-45% by visual estimate. ?  ?[name]'s anatomy is for all intents and purposes unchanged from her prior cath performed by Dr. Martinique in 2020.  All her grafts are patent.  Her LVEDP was 18.  Her EF looks to be in the 40 to 45% range with an anteroapical wall motion abnormality.  I suspect that her chest pain and mild enzyme leak was related to "demand ischemia".  Medical therapy will be recommended.  If femoral angiogram  was performed and the stick appeared to be slightly high and therefore Mynx closure was not performed.  The patient will need a manual hold in the recovery bay prior to going to the floor.  Dr. Domenic Polite, the patient's attending cardiologist, was notified of these results. ? ?Echo from 07/16/21: ? ? 1. Left  ventricular ejection fraction, by estimation, is 30 to 35%. The  ?left ventricle has moderately decreased function. The left ventricle  ?demonstrates regional wall motion abnormalities (see scoring  ?diagram/findings for description). There is mild  ?left ventricular hypertrophy. Left ventricular diastolic parameters are  ?consistent with Grade II diastolic dysfunction (pseudonormalization).  ?Elevated left atrial pressure.  ? 2. Right ventricular systolic function is moderately reduced. The right  ?ventricular size is normal. There is mildly elevated pulmonary artery  ?systolic pressure. The estimated right ventricular systolic pressure is  ?28.7 mmHg.  ? 3. Left atrial size was severely dilated.  ? 4. Right atrial size was mildly dilated.  ? 5. The mitral valve is degenerative. Moderate mitral valve regurgitation.  ?Mild to moderate mitral stenosis. MG 108mHg at 70bpm, MVA 1.2 cm^2 by  ?continuity equation. Moderate mitral annular calcification.  ? 6. Tricuspid valve regurgitation is mild to moderate.  ? 7. The aortic valve is tricuspid. Aortic valve regurgitation is not  ?visualized. Aortic valve sclerosis/calcification is present, without any  ?evidence of aortic stenosis.  ? 8. The inferior vena cava is normal in size with greater than 50%  ?respiratory variability, suggesting right atrial pressure of 3 mmHg.  ? ?Patient Profile  ?   ?81y.o. female with PMH of CAD s/p CABG with LIMA-LAD, SVG-OM, RI and distal RCA in 2013 and DES to D1 28676 systolic heart failure, ESRD on HD MWF, chronic anemia, paroxysmal atrial flutter/fib not on anticoagulation due to recurrent GI bleed, cardiology is following since 10/19/21  for chest pain evaluation.  ? ?Assessment & Plan  ?  ?Chest pain ?Elevated troponin  ?CAD  s/p CABG 2013 and DES 2018 ?- presented with chest pain, SOB, left ear pain; aborted dialysis 4/7 due to lo

## 2021-10-19 NOTE — Progress Notes (Signed)
Patient transferred from ED at 1256hrs.  Oriented to unit and plan of care for shift.  Husband at bedside.  ?

## 2021-10-19 NOTE — ED Provider Notes (Signed)
?[facility] ?Provider Note ? ? ?CSN: [id number] ?Arrival date & time: 10/18/21  2159 ? ?  ? ?History ? ?Chief Complaint  ?Patient presents with  ? Chest Pain  ? ? ?Shawna [name] is a 81 y.o. female with history of CAD status post CABG ESRD on dialysis Monday/Wednesday/Friday, AVM of the small bowel preventing her from anticoagulation secondary to history of GI bleed, hypertension, and CHF who presents with concern for intermittent chest pain for the last 3 days without worsening with exertion.  Does radiate to the left neck and jaw.  Some associated shortness of breath though this is difficult for her to differentiate from her normal shortness of breath on the days she is due for dialysis.   ? ?Patient was seen at [facility], ED Sunday with elevation in troponin to 90, baseline near the 30s.  Troponin elevation favored to be secondary to fluid overload this patient had not completed her dialysis session on Friday secondary to hypotension.  She returns today with worsening intermittent pain in the left chest with radiation as above. ? ?Patient also notes she is currently being treated for left ear infection and has had worsening of her pain over the last 24 hours. ? ?History of ovarian cancer s/p hysterectomy. ? ?HPI ? ?  ? ?Home Medications ?Prior to Admission medications   ?Medication Sig Start Date End Date Taking? Authorizing Provider  ?ALPRAZolam (XANAX) 0.5 MG tablet Take 0.5 mg by mouth at bedtime. 03/20/21   [provider]  ?amiodarone (PACERONE) 100 MG tablet Take 1 tablet (100 mg total) by mouth daily. 08/20/21   Roxan Hockey, MD  ?aspirin EC 81 MG tablet Take 1 tablet (81 mg total) by mouth daily with breakfast. 08/20/21   Roxan Hockey, MD  ?cefdinir (OMNICEF) 300 MG capsule Take 1 capsule (300 mg total) by mouth 2 (two) times daily. 10/17/21   Triplett, Tammy, PA-C  ?hydrOXYzine (VISTARIL) 100 MG capsule Take 100 mg by mouth daily as needed for anxiety.  11/23/20   [provider]  ?levothyroxine (SYNTHROID) 25 MCG tablet Take 1 tablet (25 mcg total) by mouth daily. 02/02/21   Brita Romp, NP  ?lidocaine-prilocaine (EMLA) cream Apply 1 application topically every Monday, Wednesday, and Friday. Prior to dialysis    [provider]  ?loperamide (IMODIUM A-D) 2 MG tablet Take 2 tablets (4 mg total) by mouth daily as needed for diarrhea or loose stools. 12/17/20   Thurnell Lose, MD  ?metoprolol succinate (TOPROL XL) 25 MG 24 hr tablet Take 1 tablet (25 mg total) by mouth daily. 08/20/21   Roxan Hockey, MD  ?midodrine (PROAMATINE) 5 MG tablet Take 1 tablet (5 mg total) by mouth See admin instructions. Take 1 to 2 tablets on  Monday, Wednesday, Friday morning BEFORE dialysis 08/20/21   Roxan Hockey, MD  ?multivitamin (RENA-VIT) TABS tablet Take 1 tablet by mouth daily.    [provider]  ?nitroGLYCERIN (NITROSTAT) 0.4 MG SL tablet Place 1 tablet (0.4 mg total) under the tongue every 5 (five) minutes x 3 doses as needed for chest pain (if no relief after 2nd dose, proceed to the ED for an evaluation or call 911). 02/19/21   Verta Ellen., NP  ?omeprazole (PRILOSEC) 20 MG capsule Take 1 capsule (20 mg total) by mouth daily. 12/01/20   Rogene Houston, MD  ?rosuvastatin (CRESTOR) 10 MG tablet Take 1 tablet (10 mg total) by mouth daily. ?Patient not taking: Reported on  09/09/2021 05/13/21   Erma Heritage, PA-C  ?sevelamer carbonate (RENVELA) 800 MG tablet Take 3 tablets (2,400 mg total) by mouth 3 (three) times daily with meals. 08/20/21   Roxan Hockey, MD  ?   ? ?Allergies    ?Amlodipine, Aspirin, Nitrofurantoin, Penicillins, Bactrim [sulfamethoxazole-trimethoprim], Contrast media [iodinated contrast media], Iron, Tylenol [acetaminophen], Gabapentin, Iron sucrose, Ranexa [ranolazine], Sucroferric oxyhydroxide, Dexilant [dexlansoprazole], Hydralazine, Levaquin [levofloxacin in d5w], Morphine and related, Pantoprazole, Plavix  [clopidogrel bisulfate], Protonix [pantoprazole sodium], and Venofer [ferric oxide]   ? ?Review of Systems   ?Review of Systems  ?Constitutional: Negative.   ?HENT:  Positive for ear pain. Negative for congestion, ear discharge, sore throat and trouble swallowing.   ?Eyes: Negative.   ?Respiratory:  Positive for shortness of breath.   ?Cardiovascular:  Positive for chest pain. Negative for palpitations and leg swelling.  ?Gastrointestinal: Negative.   ?Genitourinary: Negative.   ?Musculoskeletal: Negative.   ?Neurological: Negative.   ? ?Physical Exam ?Updated Vital Signs ?BP (!) 125/39   Pulse (!) 121   Temp 98.6 ?F (37 ?C) (Oral)   Resp (!) 21   SpO2 100%  ?Physical Exam ?Vitals and nursing note reviewed.  ?Constitutional:   ?   Appearance: She is not toxic-appearing.  ?HENT:  ?   Head: Normocephalic and atraumatic.  ?   Right Ear: Tympanic membrane and external ear normal. No mastoid tenderness.  ?   Left Ear: No mastoid tenderness.  ?   Ears:  ?   Comments: Edematous and erythematous left EAC with thick white and watery discharge consistent with previously diagnosed otitis externa.  TM unable to be visualized due to EAC edema ?   Nose: Nose normal.  ?   Mouth/Throat:  ?   Mouth: Mucous membranes are moist.  ?   Pharynx: Oropharynx is clear. Uvula midline. No oropharyngeal exudate or posterior oropharyngeal erythema.  ?   Tonsils: No tonsillar exudate.  ?Eyes:  ?   General: Lids are normal. Vision grossly intact.     ?   Right eye: No discharge.     ?   Left eye: No discharge.  ?   Conjunctiva/sclera: Conjunctivae normal.  ?   Pupils: Pupils are equal, round, and reactive to light.  ?Neck:  ?   Trachea: Trachea and phonation normal.  ?Cardiovascular:  ?   Rate and Rhythm: Normal rate and regular rhythm.  ?   Pulses: Normal pulses.  ?   Heart sounds: Normal heart sounds. No murmur heard. ?Pulmonary:  ?   Effort: Pulmonary effort is normal. No tachypnea, bradypnea, accessory muscle usage, prolonged expiration  or respiratory distress.  ?   Breath sounds: Normal breath sounds. No wheezing or rales.  ?Chest:  ?   Chest wall: No mass, lacerations, deformity, swelling, tenderness, crepitus or edema.  ?Abdominal:  ?   General: Bowel sounds are normal. There is no distension.  ?   Palpations: Abdomen is soft.  ?   Tenderness: There is no abdominal tenderness. There is no right CVA tenderness, left CVA tenderness or guarding.  ?Musculoskeletal:     ?   General: No deformity.  ?   Cervical back: Normal range of motion and neck supple.  ?   Right lower leg: 1+ Edema present.  ?   Left lower leg: 1+ Edema present.  ?Lymphadenopathy:  ?   Cervical: No cervical adenopathy.  ?Skin: ?   General: Skin is warm and dry.  ?   Capillary Refill: Capillary refill takes  less than 2 seconds.  ?Neurological:  ?   Mental Status: She is alert. Mental status is at baseline.  ?   GCS: GCS eye subscore is 4. GCS verbal subscore is 5. GCS motor subscore is 6.  ?   Gait: Gait is intact.  ?Psychiatric:     ?   Mood and Affect: Mood normal.  ? ? ?ED Results / Procedures / Treatments   ?Labs ?(all labs ordered are listed, but only abnormal results are displayed) ?Labs Reviewed  ?BASIC METABOLIC PANEL - Abnormal; Notable for the following components:  ?    Result Value  ? Potassium 3.4 (*)   ? Glucose, Bld 161 (*)   ? BUN 30 (*)   ? Creatinine, Ser 4.29 (*)   ? Calcium 8.4 (*)   ? GFR, Estimated 10 (*)   ? All other components within normal limits  ?CBC - Abnormal; Notable for the following components:  ? RBC 2.52 (*)   ? Hemoglobin 8.6 (*)   ? HCT 27.0 (*)   ? MCV 107.1 (*)   ? MCH 34.1 (*)   ? RDW 19.1 (*)   ? All other components within normal limits  ?TROPONIN I (HIGH SENSITIVITY) - Abnormal; Notable for the following components:  ? Troponin I (High Sensitivity) 132 (*)   ? All other components within normal limits  ?TROPONIN I (HIGH SENSITIVITY)  ? ? ?EKG ?EKG Interpretation ? ?Date/Time:  Monday October 18 2021 22:19:54 EDT ?Ventricular Rate:  126 ?PR  Interval:    ?QRS Duration: 132 ?QT Interval:  408 ?QTC Calculation: 590 ?R Axis:   45 ?Text Interpretation: Critical Test Result: Long QTc Wide QRS tachycardia Left ventricular hypertrophy with QRS widenin

## 2021-10-19 NOTE — Progress Notes (Signed)
ANTICOAGULATION CONSULT NOTE - Initial Consult ? ?Pharmacy Consult for Heparin  ?Indication: chest pain/ACS ? ?Allergies  ?Allergen Reactions  ? Amlodipine Swelling  ? Aspirin Other (See Comments)  ?  High Doses Mess up her stomach; "makes my bowels have blood in them". ?Takes 81 mg EC Aspirin   ? Nitrofurantoin Hives  ? Penicillins Other (See Comments)  ?  SYNCOPE? , "makes me real weak when I take it; like I'll pass out" ? ?Has patient had a PCN reaction causing immediate rash, facial/tongue/throat swelling, SOB or lightheadedness with hypotension: Yes ?Has patient had a PCN reaction causing severe rash involving mucus membranes or skin necrosis: no ?Has patient had a PCN reaction that required hospitalization no ?Has patient had a PCN reaction occurring within the last 10 years: no ?If all of the above ?  ? Bactrim [Sulfamethoxazole-Trimethoprim] Rash  ? Contrast Media [Iodinated Contrast Media] Itching  ? Iron Itching and Other (See Comments)  ?  "they gave me iron in dialysis; had to give me Benadryl cause I had to have the iron" (05/02/2012)  ? Tylenol [Acetaminophen] Itching and Other (See Comments)  ?  Makes her feet on fire per pt  ? Gabapentin Other (See Comments)  ?  Unknown reaction  ? Iron Sucrose   ? Ranexa [Ranolazine] Other (See Comments)  ?  Myoclonus-hospitalized   ? Sucroferric Oxyhydroxide   ? Dexilant [Dexlansoprazole] Other (See Comments)  ?  Upset stomach  ? Hydralazine Itching  ?  Has tolerated while inpatient ?Has tolerated while inpatient  ? Levaquin [Levofloxacin In D5w] Rash  ? Morphine And Related Itching and Other (See Comments)  ?  Itching in feet  ? Pantoprazole Rash  ? Plavix [Clopidogrel Bisulfate] Rash  ? Protonix [Pantoprazole Sodium] Rash  ? Venofer [Ferric Oxide] Itching and Other (See Comments)  ?  Patient reports using Benadryl prior to doses as [place]  ? ? ? ?Vital Signs: ?Temp: 98.6 ?F (37 ?C) (04/10 2203) ?Temp Source: Oral (04/10 2203) ?BP: 131/42 (04/11  0215) ?Pulse Rate: 59 (04/11 0215) ? ?Labs: ?Recent Labs  ?  10/17/21 ?1746 10/18/21 ?2217 10/19/21 ?0008  ?HGB 8.5* 8.6*  --   ?HCT 27.8* 27.0*  --   ?PLT PLATELET CLUMPS NOTED ON SMEAR, COUNT APPEARS ADEQUATE PLATELET CLUMPS NOTED ON SMEAR, UNABLE TO ESTIMATE  --   ?CREATININE 7.95* 4.29*  --   ?TROPONINIHS 96* 132* 132*  ? ? ?Estimated Creatinine Clearance: 8.1 mL/min (A) (by C-G formula based on SCr of 4.29 mg/dL (H)). ? ? ?Medical History: ?Past Medical History:  ?Diagnosis Date  ? Acute on chronic respiratory failure with hypoxia ([place]) 10/10/2016  ? Anxiety   ? Arthritis   ? AVM (arteriovenous malformation) of colon   ? CAD (coronary artery disease)   ? a. s/p CABG in 2013 b. DES to D1 in 10/2016. c. cath in 07/2018 showing patent grafts with occlusion of D1 at prior stent site and progression of PDA disease --> medical management recommended  ? Carotid artery disease ([place])   ? a. 96-75% LICA, 03/1637   ? Chronic anemia   ? Chronic bronchitis ([place])   ? Chronic diastolic CHF (congestive heart failure) ([place])   ? a. 02/2012 Echo EF 60-65%, nl wall motion, Gr 1 DD, mod MR  ? Colon cancer ([place]) 1992  ? Esophageal stricture   ? ESRD on hemodialysis ([place])   ? ESRD due to HTN, started dialysis 2011 and gets HD at [facility] with Dr  Befakadu on MWF schedule.  Access is LUA AVF as of Sept 2014.   ? GERD (gastroesophageal reflux disease)   ? High cholesterol 12/2011  ? History of blood transfusion 07/2011; 12/2011; 01/2012 X 2; 04/2012  ? History of gout   ? History of lower GI bleeding   ? Hypertension   ? Iron deficiency anemia   ? Jugular vein occlusion, right ([place])   ? Mitral regurgitation   ? a. Moderate by echo, 02/2012  ? Myocardial infarction [facility])   ? NSVT (nonsustained ventricular tachycardia) ([place])   ? Ovarian cancer ([place]) 1992  ? PAF (paroxysmal atrial fibrillation) ([place])   ? Pneumonia ~ 2009  ? PUD (peptic ulcer disease)   ? TIA (transient ischemic attack)   ? ? ? ?Assessment: ?81 y/o F with chest pain and mildly  elevated troponin. Starting heparin. Hgb 8.6. ESRD on HD. PTA meds reviewed.  ? ?Goal of Therapy:  ?Heparin level 0.3-0.7 units/ml ?Monitor platelets by anticoagulation protocol: Yes ?  ?Plan:  ?Heparin 3000 units BOLUS ?Start heparin drip at 650 units/hr ?1000 Heparin level ?Daily CBC/Heparin level ?Monitor for bleeding ? ?Narda Bonds, PharmD, BCPS ?Clinical Pharmacist ?Phone: [phone] ? ?

## 2021-10-19 NOTE — Consult Note (Signed)
[facility] ?Renal Consultation Note  ?  ?Indication for Consultation:  Management of ESRD/hemodialysis, anemia, hypertension/volume, and secondary hyperparathyroidism. ? ?HPI: Shawna [name] is a 81 y.o. female with PMH including ESRD on dialysis, CAD, CHF, HTN, and HLD who presented to the ED last night with chest pain. Patient's husband reports her chest pain began around 10pm. She went to dialysis yesterday and did not have any chest pain but he reports they have not been reaching her EDW due to hypotension. Labs notable for K+ 3.6, BUN 35, Cr 4.89, WBC 6.2, Hgb 8.0, platelet clumping. CXR with COPD changes but no edema.  In the ED, patient was started. She was seen by cardiology this AM and they recommended medical therapy with ASA, crestor, and metoprolol. At time of exam, patient reports her chest pain has resolved. She denies SOB, dizziness, palpitations, abdominal pain, N/V/D. No recent fevers or chills. No abnormal bleeding. She is tired from being in the ED overnight but otherwise no concerns reported.  ? ?Past Medical History:  ?Diagnosis Date  ? Acute on chronic respiratory failure with hypoxia ([place]) 10/10/2016  ? Anxiety   ? Arthritis   ? AVM (arteriovenous malformation) of colon   ? CAD (coronary artery disease)   ? a. s/p CABG in 2013 b. DES to D1 in 10/2016. c. cath in 07/2018 showing patent grafts with occlusion of D1 at prior stent site and progression of PDA disease --> medical management recommended  ? Carotid artery disease ([place])   ? a. 09-62% LICA, 02/3661   ? Chronic anemia   ? Chronic bronchitis ([place])   ? Chronic diastolic CHF (congestive heart failure) ([place])   ? a. 02/2012 Echo EF 60-65%, nl wall motion, Gr 1 DD, mod MR  ? Colon cancer ([place]) 1992  ? Esophageal stricture   ? ESRD on hemodialysis ([place])   ? ESRD due to HTN, started dialysis 2011 and gets HD at [facility] with Dr Hinda Lenis on MWF schedule.  Access is LUA AVF as of Sept 2014.   ? GERD (gastroesophageal reflux disease)    ? High cholesterol 12/2011  ? History of blood transfusion 07/2011; 12/2011; 01/2012 X 2; 04/2012  ? History of gout   ? History of lower GI bleeding   ? Hypertension   ? Iron deficiency anemia   ? Jugular vein occlusion, right ([place])   ? Mitral regurgitation   ? a. Moderate by echo, 02/2012  ? Myocardial infarction [facility])   ? NSVT (nonsustained ventricular tachycardia) ([place])   ? Ovarian cancer ([place]) 1992  ? PAF (paroxysmal atrial fibrillation) ([place])   ? Pneumonia ~ 2009  ? PUD (peptic ulcer disease)   ? TIA (transient ischemic attack)   ? ?Past Surgical History:  ?Procedure Laterality Date  ? A/V SHUNTOGRAM Left 03/19/2019  ? Procedure: A/V SHUNTOGRAM;  Surgeon: Katha Cabal, MD;  Location: [place] [facility];  Service: Cardiovascular;  Laterality: Left;  ? ABDOMINAL HYSTERECTOMY  1992  ? APPENDECTOMY  06/1990  ? AV FISTULA PLACEMENT  07/2009  ? left upper arm  ? AV FISTULA PLACEMENT Right 09/06/2016  ? Procedure: RIGHT FOREARM ARTERIOVENOUS (AV) GRAFT;  Surgeon: Elam Dutch, MD;  Location: [place];  Service: Vascular;  Laterality: Right;  ? AV FISTULA PLACEMENT N/A 02/24/2017  ? Procedure: INSERTION OF ARTERIOVENOUS (AV) GORE-TEX GRAFT ARM (BRACHIAL AXILLARY);  Surgeon: Katha Cabal, MD;  Location: [facility];  Service: Vascular;  Laterality: N/A;  ? [place] REMOVAL Right 09/06/2016  ?  Procedure: REMOVAL OF Right Arm ARTERIOVENOUS GORETEX GRAFT and Vein Patch angioplasty of brachial artery;  Surgeon: Angelia Mould, MD;  Location: [place];  Service: Vascular;  Laterality: Right;  ? BIOPSY  09/26/2019  ? Procedure: BIOPSY;  Surgeon: Rogene Houston, MD;  [facility];  Service: Endoscopy;;  ? COLON RESECTION  1992  ? COLON SURGERY    ? COLONOSCOPY N/A 03/09/2019  ? Procedure: COLONOSCOPY;  Surgeon: Rogene Houston, MD;  [facility];  Service: Endoscopy;  Laterality: N/A;  ? COLONOSCOPY WITH PROPOFOL N/A 06/08/2021  ? Procedure: COLONOSCOPY WITH PROPOFOL;  Surgeon: Harvel Quale, MD;  [facility];  Service: Gastroenterology;  Laterality: N/A;  9:05 /Patient is on dialysis Mon Wed Fri  ? CORONARY ANGIOPLASTY WITH STENT PLACEMENT  12/15/11  ? "2"  ? CORONARY ANGIOPLASTY WITH STENT PLACEMENT  y/2013  ? "1; makes total of 3" (05/02/2012)  ? CORONARY ARTERY BYPASS GRAFT  06/13/2012  ? Procedure: CORONARY ARTERY BYPASS GRAFTING (CABG);  Surgeon: Grace Isaac, MD;  Location: [place];  Service: Open Heart Surgery;  Laterality: N/A;  cabg x four;  using left internal mammary artery, and left leg greater saphenous vein harvested endoscopically  ? CORONARY STENT INTERVENTION N/A 10/13/2016  ? Procedure: Coronary Stent Intervention;  Surgeon: Troy Sine, MD;  Location: [place] CV [facility];  Service: Cardiovascular;  Laterality: N/A;  ? DIALYSIS/PERMA CATHETER REMOVAL N/A 04/18/2017  ? Procedure: DIALYSIS/PERMA CATHETER REMOVAL;  Surgeon: Katha Cabal, MD;  Location: [place] CV [facility];  Service: Cardiovascular;  Laterality: N/A;  ? DILATION AND CURETTAGE OF UTERUS    ? ENTEROSCOPY N/A 06/08/2021  ? Procedure: PUSH ENTEROSCOPY;  Surgeon: Harvel Quale, MD;  [facility];  Service: Gastroenterology;  Laterality: N/A;  ? ESOPHAGOGASTRODUODENOSCOPY  01/20/2012  ? Procedure: ESOPHAGOGASTRODUODENOSCOPY (EGD);  Surgeon: Ladene Artist, MD,FACG;  Location: [facility] ENDOSCOPY;  Service: Endoscopy;  Laterality: N/A;  ? ESOPHAGOGASTRODUODENOSCOPY N/A 03/26/2013  ? Procedure: ESOPHAGOGASTRODUODENOSCOPY (EGD);  Surgeon: Irene Shipper, MD;  Location: [facility] ENDOSCOPY;  Service: Endoscopy;  Laterality: N/A;  ? ESOPHAGOGASTRODUODENOSCOPY N/A 04/30/2015  ? Procedure: ESOPHAGOGASTRODUODENOSCOPY (EGD);  Surgeon: Rogene Houston, MD;  [facility];  Service: Endoscopy;  Laterality: N/A;  1pm - moved to 10/20 @ 1:10  ? ESOPHAGOGASTRODUODENOSCOPY N/A 07/29/2016  ? Procedure: ESOPHAGOGASTRODUODENOSCOPY (EGD);  Surgeon: Manus Gunning, MD;  Location: [place];   Service: Gastroenterology;  Laterality: N/A;  enteroscopy  ? ESOPHAGOGASTRODUODENOSCOPY N/A 09/26/2019  ? Procedure: ESOPHAGOGASTRODUODENOSCOPY (EGD);  Surgeon: Rogene Houston, MD;  [facility];  Service: Endoscopy;  Laterality: N/A;  1250  ? ESOPHAGOGASTRODUODENOSCOPY (EGD) WITH PROPOFOL N/A 02/05/2021  ? Procedure: ESOPHAGOGASTRODUODENOSCOPY (EGD) WITH PROPOFOL;  Surgeon: Eloise Harman, DO;  [facility];  Service: Endoscopy;  Laterality: N/A;  ? GIVENS CAPSULE STUDY N/A 03/07/2019  ? Procedure: GIVENS CAPSULE STUDY;  Surgeon: Rogene Houston, MD;  [facility];  Service: Endoscopy;  Laterality: N/A;  7:30  ? GIVENS CAPSULE STUDY N/A 04/22/2021  ? Procedure: GIVENS CAPSULE STUDY;  Surgeon: Rogene Houston, MD;  [facility];  Service: Endoscopy;  Laterality: N/A;  7:30  ? INSERTION OF DIALYSIS CATHETER N/A 10/05/2020  ? Procedure: ABORTED TUNNELED DIALYSIS CATHETER PLACEMENT RIGHT INTERNAL JUGULAR VEIN ;  Surgeon: Virl Cagey, MD;  [facility];  Service: General;  Laterality: N/A;  ? INTRAOPERATIVE TRANSESOPHAGEAL ECHOCARDIOGRAM  06/13/2012  ? Procedure: INTRAOPERATIVE  TRANSESOPHAGEAL ECHOCARDIOGRAM;  Surgeon: Grace Isaac, MD;  Location: [place];  Service: Open Heart Surgery;  Laterality: N/A;  ? IR DIALY SHUNT INTRO NEEDLE/INTRACATH INITIAL W/IMG LEFT Left 10/06/2020  ? IR FLUORO GUIDE CV LINE RIGHT  06/17/2020  ? IR GENERIC HISTORICAL  07/26/2016  ? IR FLUORO GUIDE CV LINE RIGHT 07/26/2016 Greggory Keen, MD [facility]  ? IR GENERIC HISTORICAL  07/26/2016  ? IR US GUIDE VASC ACCESS RIGHT 07/26/2016 Greggory Keen, MD [facility]  ? IR GENERIC HISTORICAL  08/02/2016  ? IR US GUIDE VASC ACCESS RIGHT 08/02/2016 Greggory Keen, MD [facility]  ? IR GENERIC HISTORICAL  08/02/2016  ? IR FLUORO GUIDE CV LINE RIGHT 08/02/2016 Greggory Keen, MD [facility]  ? IR RADIOLOGY PERIPHERAL GUIDED IV START  03/28/2017  ? IR REMOVAL TUN CV CATH W/O FL  08/11/2020  ? IR  THROMBECTOMY AV FISTULA W/THROMBOLYSIS INC/SHUNT/IMG LEFT Left 06/17/2020  ? IR US GUIDE VASC ACCESS LEFT  06/17/2020  ? IR US GUIDE VASC ACCESS RIGHT  03/28/2017  ? IR US GUIDE VASC ACCESS RIGHT  06/17/2020  ? LEFT

## 2021-10-20 DIAGNOSIS — I2510 Atherosclerotic heart disease of native coronary artery without angina pectoris: Secondary | ICD-10-CM | POA: Diagnosis not present

## 2021-10-20 DIAGNOSIS — I5020 Unspecified systolic (congestive) heart failure: Secondary | ICD-10-CM | POA: Diagnosis not present

## 2021-10-20 DIAGNOSIS — I214 Non-ST elevation (NSTEMI) myocardial infarction: Secondary | ICD-10-CM | POA: Diagnosis not present

## 2021-10-20 DIAGNOSIS — I120 Hypertensive chronic kidney disease with stage 5 chronic kidney disease or end stage renal disease: Secondary | ICD-10-CM | POA: Diagnosis not present

## 2021-10-20 LAB — CBC
HCT: 28 % — ABNORMAL LOW (ref 36.0–46.0)
Hemoglobin: 9 g/dL — ABNORMAL LOW (ref 12.0–15.0)
MCH: 34.2 pg — ABNORMAL HIGH (ref 26.0–34.0)
MCHC: 32.1 g/dL (ref 30.0–36.0)
MCV: 106.5 fL — ABNORMAL HIGH (ref 80.0–100.0)
Platelets: UNDETERMINED 10*3/uL (ref 150–400)
RBC: 2.63 MIL/uL — ABNORMAL LOW (ref 3.87–5.11)
RDW: 18.8 % — ABNORMAL HIGH (ref 11.5–15.5)
WBC: 8 10*3/uL (ref 4.0–10.5)
nRBC: 0 % (ref 0.0–0.2)

## 2021-10-20 LAB — HEPATITIS B SURFACE ANTIBODY,QUALITATIVE: Hep B S Ab: NONREACTIVE

## 2021-10-20 LAB — GLUCOSE, CAPILLARY
Glucose-Capillary: 105 mg/dL — ABNORMAL HIGH (ref 70–99)
Glucose-Capillary: 156 mg/dL — ABNORMAL HIGH (ref 70–99)

## 2021-10-20 LAB — HEPATITIS B SURFACE ANTIGEN: Hepatitis B Surface Ag: NONREACTIVE

## 2021-10-20 MED ORDER — MIDODRINE HCL 5 MG PO TABS: 5.0000 mg | ORAL_TABLET | ORAL | 3 refills | Status: DC

## 2021-10-20 MED ORDER — NEOMYCIN-POLYMYXIN-HC 3.5-10000-1 OT SUSP
3.0000 [drp] | Freq: Four times a day (QID) | OTIC | 0 refills | Status: DC
Start: 2021-10-20 — End: 2021-11-24

## 2021-10-20 NOTE — Discharge Summary (Signed)
[facility] ? ?Physician Discharge Summary ?  ?Patient: Shawna [name] MRN: [id number] DOB: 1941/01/13  ?Admit date:     10/18/2021  ?Discharge date: 10/20/21  ?Discharge Physician: Nita Sells  ? ?PCP: [facility]  ? ?Recommendations at discharge:  ? ?Will need outpatient TSH, iron studies in about 3 weeks ?Requires renal panel in about 3 to 5 days and adjustment of midodrine dosing by nephrology ?Might benefit from outpatient goals of care depending on frequency of readmissions ? ? ? ?Discharge Diagnoses: ?Principal Problem: ?  NSTEMI, initial episode of care [facility]) ?Active Problems: ?  Chest pain ? ?Resolved Problems: ?  * No resolved hospital problems. * ? ?Hospital Course: ?81 year old African-American female ?CAD status post CABG with distal small vessel disease 2013 (recent [facility] 07/2021 = all grafts patent), HFrEF,  ?recurrent GI bleed with chronic anemia precluding anticoagulation for ?Recurrent a flutter and?  Microangiopathic angina ?ESRD dialysis MWF ? ?Seen at [facility] hospital 4/9 with left ear pain chest pain shortness of breath-troponins bumped from 30-90 but was thought to be secondary to fluid overload not ACS and patient was discharged home as EKG was stable ? ?Admitted 4/10 2/2 recurrent intermittent chest reminiscent of her angina-previously treated in the outpatient for otitis-seen by nephrology and found to have purulent otitis media ? ?EKG showed sinus tach with LVH and early repolarization hemoglobin slightly low 8.6 troponins 130 range ?Heart rate 120 ?Cardiology and renal consulted ? ? ? ?Assessment and Plan: ?Patient was observed overnight 4/11 and seen on 4/12 on dialysis-patient was tolerating dialysis fairly well without drop in blood pressure and had no recurrence of chest pain-it was not felt by cardiology that there was any role for further interventional cardiology input given she had patent grafts from her last left heart cath 07/2021-she had no chest pain whatsoever  subsequently ?She has a cardiology follow-up appointment on 4/13 in the office and seems clinically compensated at time of discharge ?She was not taking her metoprolol XL at home prior to admission as this had been discontinued several months prior ?Midodrine was adjusted upwards to 5 to 10 mg with dialysis and as an outpatient she should have a determination by her primary care physician with regards to goals of care if she continues to have microangiopathic chest pain ?She may be a candidate for Ranexa or other therapies which are supportive ?She had met maximal hospital benefit and was discharged home in a stable state with close follow-up ?She had an acute otitis media from prior to admission that was treated and she was discharged on otic drops and should follow-up in the outpatient setting with her primary care physician ? ? ? ? ? ?  ? ? ?Consultants: Cardiology, renal ?Procedures performed: None ?Disposition: Home ?Diet recommendation:  ?Discharge Diet Orders (From admission, onward)  ? ?  Start     Ordered  ? 10/20/21 0000  Diet - low sodium heart healthy       ? 10/20/21 1515  ? ?  ?  ? ?  ? ?Renal diet ?DISCHARGE MEDICATION: ?Allergies as of 10/20/2021   ? ?   Reactions  ? Amlodipine Swelling  ? Aspirin Other (See Comments)  ? High Doses Mess up her stomach; "makes my bowels have blood in them". ?Takes 81 mg EC Aspirin   ? Nitrofurantoin Hives  ? Penicillins Other (See Comments)  ? SYNCOPE? , "makes me real weak when I take it; like I'll pass out" ?Has patient had a PCN reaction causing  immediate rash, facial/tongue/throat swelling, SOB or lightheadedness with hypotension: Yes ?Has patient had a PCN reaction causing severe rash involving mucus membranes or skin necrosis: no ?Has patient had a PCN reaction that required hospitalization no ?Has patient had a PCN reaction occurring within the last 10 years: no ?If all of the above  ? Bactrim [sulfamethoxazole-trimethoprim] Rash  ? Contrast Media [iodinated  Contrast Media] Itching  ? Iron Itching, Other (See Comments)  ? "they gave me iron in dialysis; had to give me Benadryl cause I had to have the iron" (05/02/2012)  ? Tylenol [acetaminophen] Itching, Other (See Comments)  ? Makes her feet on fire per pt  ? Gabapentin Other (See Comments)  ? Unknown reaction  ? Iron Sucrose   ? Ranexa [ranolazine] Other (See Comments)  ? Myoclonus-hospitalized   ? Sucroferric Oxyhydroxide   ? Dexilant [dexlansoprazole] Other (See Comments)  ? Upset stomach  ? Hydralazine Itching  ? Has tolerated while inpatient ?Has tolerated while inpatient  ? Levaquin [levofloxacin In D5w] Rash  ? Morphine And Related Itching, Other (See Comments)  ? Itching in feet  ? Pantoprazole Rash  ? Plavix [clopidogrel Bisulfate] Rash  ? Protonix [pantoprazole Sodium] Rash  ? Venofer [ferric Oxide] Itching, Other (See Comments)  ? Patient reports using Benadryl prior to doses as [place]  ? ?  ? ?  ?Medication List  ?  ? ?STOP taking these medications   ? ?cefdinir 300 MG capsule ?Commonly known as: OMNICEF ?  ?metoprolol succinate 25 MG 24 hr tablet ?Commonly known as: Toprol XL ?  ? ?  ? ?TAKE these medications   ? ?ALPRAZolam 0.5 MG tablet ?Commonly known as: Duanne [place]on ?Take 0.5 mg by mouth at bedtime. ?  ?amiodarone 100 MG tablet ?Commonly known as: PACERONE ?Take 1 tablet (100 mg total) by mouth daily. ?  ?aspirin EC 81 MG tablet ?Take 1 tablet (81 mg total) by mouth daily with breakfast. ?What changed:  ?how much to take ?when to take this ?reasons to take this ?  ?gabapentin 100 MG capsule ?Commonly known as: NEURONTIN ?Take 100 mg by mouth 2 (two) times daily. ?  ?hydrOXYzine 100 MG capsule ?Commonly known as: VISTARIL ?Take 100 mg by mouth daily as needed for anxiety. ?  ?levothyroxine 25 MCG tablet ?Commonly known as: SYNTHROID ?Take 1 tablet (25 mcg total) by mouth daily. ?  ?lidocaine-prilocaine cream ?Commonly known as: EMLA ?Apply 1 application topically every Monday, Wednesday, and Friday.  Prior to dialysis ?  ?loperamide 2 MG tablet ?Commonly known as: IMODIUM A-D ?Take 2 tablets (4 mg total) by mouth daily as needed for diarrhea or loose stools. ?What changed: how much to take ?  ?midodrine 5 MG tablet ?Commonly known as: PROAMATINE ?Take 1-2 tablets (5-10 mg total) by mouth every Monday, Wednesday, and Friday with hemodialysis. ?What changed:  ?how much to take ?when to take this ?additional instructions ?  ?multivitamin Tabs tablet ?Take 1 tablet by mouth daily. ?  ?neomycin-polymyxin-hydrocortisone 3.5-10000-1 OTIC suspension ?Commonly known as: CORTISPORIN ?Place 3 drops into the left ear every 6 (six) hours. ?  ?nitroGLYCERIN 0.4 MG SL tablet ?Commonly known as: NITROSTAT ?Place 1 tablet (0.4 mg total) under the tongue every 5 (five) minutes x 3 doses as needed for chest pain (if no relief after 2nd dose, proceed to the ED for an evaluation or call 911). ?  ?omeprazole 20 MG capsule ?Commonly known as: PRILOSEC ?Take 1 capsule (20 mg total) by mouth daily. ?What changed:  ?  how much to take ?when to take this ?reasons to take this ?  ?rosuvastatin 10 MG tablet ?Commonly known as: Crestor ?Take 1 tablet (10 mg total) by mouth daily. ?  ?sevelamer carbonate 800 MG tablet ?Commonly known as: RENVELA ?Take 3 tablets (2,400 mg total) by mouth 3 (three) times daily with meals. ?  ? ?  ? ? ?Discharge Exam: ?Filed Weights  ? 10/19/21 1257 10/20/21 0500 10/20/21 1350  ?Weight: 56.8 kg 56.7 kg 59.3 kg  ? ?Awake coherent no distress EOMI NCAT no focal deficit no icterus no pallor ?Chest clear no added sound no rales rhonchi ?Seen on HD-fistula in left upper extremity is working ?Abdomen is soft no rebound no guarding ?No lower extremity edema ?Neurologically intact with no focal deficit ? ?Condition at discharge: fair ? ?The results of significant diagnostics from this hospitalization (including imaging, microbiology, ancillary and laboratory) are listed below for reference.  ? ?Imaging Studies: ?DG Chest  2 View ? ?Result Date: 10/18/2021 ?CLINICAL DATA:  Chest pain EXAM: CHEST - 2 VIEW COMPARISON:  10/17/2021 FINDINGS: Cardiac shadow is mildly enlarged but stable. Postsurgical changes are noted. The lungs are hyperin

## 2021-10-20 NOTE — TOC Initial Note (Signed)
Transition of Care (TOC) - Initial/Assessment Note  ? ? ?Patient Details  ?Name: Shawna [name] ?MRN: [id number] ?Date of Birth: 1940/10/06 ? ?Transition of Care (TOC) CM/SW Contact:    ?Zenon Mayo, RN ?Phone Number: ?10/20/2021, 4:43 PM ? ?Clinical Narrative:                 ?Patient is for dc today, she states she has a walker at home , her spouse will transport her home today. She is in HD right now.  She has no needs.  ? ?Expected Discharge Plan: Home/Self Care ?Barriers to Discharge: No Barriers Identified ? ? ?Patient Goals and CMS Choice ?Patient states their goals for this hospitalization and ongoing recovery are:: return home ?  ?Choice offered to / list presented to : NA ? ?Expected Discharge Plan and Services ?Expected Discharge Plan: Home/Self Care ?  ?Discharge Planning Services: CM Consult ?  ?Living arrangements for the past 2 months: [place] ?Expected Discharge Date: 10/20/21               ?  ?DME Agency: NA ?  ?  ?  ?HH Arranged: NA ?  ?  ?  ?  ? ?Prior Living Arrangements/Services ?Living arrangements for the past 2 months: [place] ?Lives with:: Spouse ?Patient language and need for interpreter reviewed:: Yes ?Do you feel safe going back to the place where you live?: Yes      ?Need for Family Participation in Patient Care: Yes (Comment) ?Care giver support system in place?: Yes (comment) ?Current home services:  (has a walker at home) ?Criminal Activity/Legal Involvement Pertinent to Current Situation/Hospitalization: No - Comment as needed ? ?Activities of Daily Living ?Home Assistive Devices/Equipment: Cane (specify quad or straight) ?ADL Screening (condition at time of admission) ?Patient's cognitive ability adequate to safely complete daily activities?: Yes ?Is the patient deaf or have difficulty hearing?: No ?Does the patient have difficulty seeing, even when wearing glasses/contacts?: No ?Does the patient have difficulty concentrating, remembering, or making  decisions?: No ?Patient able to express need for assistance with ADLs?: Yes ?Does the patient have difficulty dressing or bathing?: No ?Independently performs ADLs?: Yes (appropriate for developmental age) ?Does the patient have difficulty walking or climbing stairs?: No ?Weakness of Legs: None ?Weakness of Arms/Hands: None ? ?Permission Sought/Granted ?  ?  ?   ?   ?   ?   ? ?Emotional Assessment ?Appearance:: Appears stated age ?Attitude/Demeanor/Rapport: Engaged ?Affect (typically observed): Appropriate ?Orientation: : Oriented to Self, Oriented to Place, Oriented to  Time, Oriented to Situation ?Alcohol / Substance Use: Not Applicable ?Psych Involvement: No (comment) ? ?Admission diagnosis:  NSTEMI (non-ST elevated myocardial infarction) ([place]) [I21.4] ?NSTEMI, initial episode of care [facility]) [I21.4] ?Chest pain [R07.9] ?Patient Active Problem List  ? Diagnosis Date Noted  ? NSTEMI, initial episode of care [facility]) 10/19/2021  ? Symptomatic anemia 09/09/2021  ? Ischemic cardiomyopathy   ? Pulmonary edema 07/16/2021  ? Paroxysmal Atrial fibrillation/Flutter 05/07/2021  ? Vitamin B deficiency 04/23/2021  ? Neuropathy 04/23/2021  ? Anemia due to chronic blood loss 04/13/2021  ? Left knee pain 03/15/2021  ? Moderate protein-calorie malnutrition ([place]) 02/27/2021  ? Hypokalemia 02/27/2021  ? COVID-19 virus infection 02/03/2021  ? Leukocytosis 02/03/2021  ? Elevated MCV 02/03/2021  ? Hypoalbuminemia due to protein-calorie malnutrition ([place]) 02/03/2021  ? Hypothyroidism (acquired) 02/03/2021  ? Myositis 12/03/2020  ? Ataxia 12/02/2020  ? Diabetes mellitus type 2 in nonobese ([place]) 11/10/2020  ? Acute pulmonary edema (  [place]) 11/10/2020  ? Generalized weakness 11/09/2020  ? Dialysis AV fistula malfunction, initial encounter ([place])   ? Jugular vein occlusion, right ([place])   ? Failure of surgically constructed arteriovenous fistula ([place]) 10/03/2020  ? Myoclonus 08/31/2020  ? Clotted renal dialysis AV graft, initial encounter ([place])   ?  Hypertensive heart and chronic kidney disease with heart failure and stage 1 through stage 4 chronic kidney disease, or chronic kidney disease ([place])   ? Hemodialysis-associated hypotension   ? Acute hypoxemic respiratory failure ([place]) 06/14/2020  ? Hypertension 06/12/2020  ? Irritable bowel syndrome 02/25/2020  ? Adenomatous duodenal polyp 09/10/2019  ? History of GI bleed 09/10/2019  ? Angina pectoris ([place]) 06/05/2019  ? Chest pain 06/03/2019  ? Small intestinal bacterial overgrowth 05/14/2019  ? Iron deficiency anemia 04/02/2019  ? Acute respiratory failure with hypoxia ([place]) 12/25/2018  ? Elevated troponin 12/14/2018  ? Chest pain at rest 07/13/2018  ? Hand steal syndrome ([place]) 08/01/2017  ? Anemia 07/14/2017  ? Coronary artery disease 06/05/2017  ? Complication of vascular access for dialysis 03/19/2017  ? Preoperative clearance 01/25/2017  ? H/O non-ST elevation myocardial infarction (NSTEMI) 10/24/2016  ? Fluid overload 10/10/2016  ? Non-ST elevation (NSTEMI) myocardial infarction [facility])   ? Acute on chronic respiratory failure with hypoxia (HCC)   ? Cardiac arrest [facility])   ? Palliative care encounter   ? Goals of care, counseling/discussion   ? Hypertensive crisis without congestive heart failure 05/09/2016  ? Flash pulmonary edema ([place]) 04/06/2016  ? Acute respiratory failure ([place]) 04/06/2016  ? History of colon cancer 01/27/2016  ? History of ovarian cancer 01/27/2016  ? Hypertensive urgency 01/27/2016  ? PAF (paroxysmal atrial fibrillation) ([place]) 10/14/2015  ? Malignant neoplasm of right ovary ([place]) 10/14/2015  ? SVT (supraventricular tachycardia) ([place]) 09/08/2015  ? Influenza A 08/30/2015  ? Acute on chronic diastolic CHF (congestive heart failure) ([place]) 05/04/2015  ? Essential hypertension   ? Dyspnea   ? Chronic combined systolic and diastolic CHF (congestive heart failure) ([place]) 03/22/2013  ? Occlusion and stenosis of carotid artery without mention of cerebral infarction 01/24/2013  ? Hx of CABG  07/05/2012  ? Carotid artery disease ([place]) 07/05/2012  ? Mitral regurgitation 06/12/2012  ? Non-STEMI (non-ST elevated myocardial infarction) ([place]) 06/08/2012  ? Ischemic chest pain ([place]) 03/01/2012  ? AVM (arteriovenous malformation) of small bowel, acquired 01/20/2012  ? GERD (gastroesophageal reflux disease) 01/09/2012  ? HLD (hyperlipidemia) 01/05/2012  ? Atherosclerotic heart disease of native coronary artery without angina pectoris 12/16/2011  ? Anxiety disorder 05/04/2011  ? Anemia in chronic kidney disease 04/29/2011  ? ESRD (end stage renal disease) on dialysis ([place]) 04/29/2011  ? Gout 04/29/2011  ? Hypertensive chronic kidney disease with stage 5 chronic kidney disease or end stage renal disease ([place]) 04/29/2011  ? ?PCP:  [facility] ?Pharmacy:   ?[place], [name] ?[place] [address] ?Phone: [phone] Fax: [phone] ? ?[facility] (ESRD Bundle Only) - [place], [place] - [id number] ?[phone] ?[address] ?[place] [address] ?Phone: [phone] Fax: [phone] ? ? ? ? ?Social Determinants of Health (SDOH) Interventions ?  ? ?Readmission Risk Interventions ? ?  10/20/2021  ?  4:38 PM 07/23/2021  ?  4:26 PM 11/11/2020  ? 11:38 AM  ?Readmission Risk Prevention Plan  ?Transportation Screening Complete Complete Complete  ?Medication Review Press photographer) Complete Complete Complete  ?PCP or Specialist appointment within 3-5 days of  discharge Complete Complete Complete  ?[place] or Home Care Consult Complete Complete Complete  ?SW Recovery Care/Counseling Consult Complete Complete Complete  ?Palliative Care Screening Not Applicable Not Applicable Not Applicable  ?[place] Not Applicable Not Applicable Not Applicable  ? ? ? ?

## 2021-10-20 NOTE — Progress Notes (Addendum)
? ?Progress Note ? ?Patient Name: Shawna [name] ?Date of Encounter: 10/20/2021 ? ?[facility] Cardiologist: Shawna Dolly, MD  ? ?Subjective  ? ?Patient states she is doing well. She denied any chest pain. She is eating breakfast. She states her dialysis is due today. She states her left ear is still hurting.  ? ?Inpatient Medications  ?  ?Scheduled Meds: ? ALPRAZolam  0.5 mg Oral QHS  ? amiodarone  100 mg Oral Daily  ? aspirin EC  81 mg Oral Daily  ? cefdinir  300 mg Oral Q M,W,F-HD  ? gabapentin  100 mg Oral BID  ? levothyroxine  25 mcg Oral Daily  ? lidocaine-prilocaine  1 application. Topical Q M,W,F-HD  ? metoprolol succinate  25 mg Oral Daily  ? midodrine  5-10 mg Oral Q M,W,F-HD  ? multivitamin  1 tablet Oral QHS  ? neomycin-polymyxin-hydrocortisone  3 drop Left EAR Q6H  ? pantoprazole  40 mg Oral Daily  ? rosuvastatin  10 mg Oral Daily  ? sevelamer carbonate  2,400 mg Oral TID WC  ? ?Continuous Infusions: ? ? ?PRN Meds: ?hydrOXYzine, nitroGLYCERIN, ondansetron (ZOFRAN) IV, oxyCODONE  ? ?Vital Signs  ?  ?Vitals:  ? 10/19/21 2100 10/20/21 0004 10/20/21 0450 10/20/21 0500  ?BP: (!) 160/44 139/84 (!) 153/45   ?Pulse: 68 61 63   ?Resp: 20 15 (!) 22   ?Temp: 97.9 ?F (36.6 ?C) 97.9 ?F (36.6 ?C) 97.8 ?F (36.6 ?C)   ?TempSrc: Oral Axillary Axillary   ?SpO2: 97% 95% 96%   ?Weight:    56.7 kg  ?Height:      ? ? ?Intake/Output Summary (Last 24 hours) at 10/20/2021 0910 ?Last data filed at 10/19/2021 1139 ?Gross per 24 hour  ?Intake 91.11 ml  ?Output --  ?Net 91.11 ml  ? ? ?  10/20/2021  ?  5:00 AM 10/19/2021  ? 12:57 PM 10/19/2021  ?  7:58 AM  ?Last 3 Weights  ?Weight (lbs) 125 lb 125 lb 3.5 oz 124 lb  ?Weight (kg) 56.7 kg 56.8 kg 56.246 kg  ?   ? ?Telemetry  ?  ?Sinus rhythm 60s , occasional PACs - Personally Reviewed ? ?ECG  ?  ?EKG today with SR with VR 63bpm, incomplete LBBB, poor R wave progression, no significant change - Personally Reviewed ? ?Physical Exam  ? ?GEN: No acute distress.   ?Neck: No JVD ?Cardiac:  RRR, no murmurs, rubs, or gallops.  ?Respiratory: Clear to auscultation bilaterally. On room air.  ?GI: Soft, nontender, non-distended  ?MS: No BLE edema; No deformity. ?Neuro:  Nonfocal, mild memory loss  ?Psych: Normal affect  ?Left arm fistula + thrill and bruit.  ? ?Labs  ?  ?High Sensitivity Troponin:   ?Recent Labs  ?Lab 10/17/21 ?1746 10/18/21 ?2217 10/19/21 ?0008 10/19/21 ?9417 10/19/21 ?4081  ?TROPONINIHS 96* 132* 132* 130* 126*  ?   ?Chemistry ?Recent Labs  ?Lab 10/17/21 ?1746 10/18/21 ?2217 10/19/21 ?0357  ?NA 141 138 139  ?K 4.6 3.4* 3.6  ?CL 103 98 103  ?CO2 '24 27 28  '$ ?GLUCOSE 100* 161* 92  ?BUN 76* 30* 35*  ?CREATININE 7.95* 4.29* 4.89*  ?CALCIUM 8.9 8.4* 8.3*  ?GFRNONAA 5* 10* 8*  ?ANIONGAP '14 13 8  '$ ?  ?Lipids  ?Recent Labs  ?Lab 10/19/21 ?0008  ?CHOL 81  ?TRIG 46  ?HDL 37*  ?Shawna [name] 35  ?CHOLHDL 2.2  ?  ?Hematology ?Recent Labs  ?Lab 10/17/21 ?1746 10/18/21 ?2217 10/19/21 ?0357  ?WBC 10.1 7.6 6.2  ?  RBC 2.57* 2.52* 2.33*  ?HGB 8.5* 8.6* 8.0*  ?HCT 27.8* 27.0* 25.2*  ?MCV 108.2* 107.1* 108.2*  ?MCH 33.1 34.1* 34.3*  ?MCHC 30.6 31.9 31.7  ?RDW 19.2* 19.1* 19.1*  ?PLT PLATELET CLUMPS NOTED ON SMEAR, COUNT APPEARS ADEQUATE PLATELET CLUMPS NOTED ON SMEAR, UNABLE TO ESTIMATE PLATELET CLUMPS NOTED ON SMEAR, UNABLE TO ESTIMATE  ? ?Thyroid  ?Recent Labs  ?Lab 10/19/21 ?2951  ?TSH 4.203  ?  ?BNP ?Recent Labs  ?Lab 10/17/21 ?1746  ?BNP 2,622.0*  ?  ?DDimer No results for input(s): DDIMER in the last 168 hours.  ? ?Radiology  ?  ?DG Chest 2 View ? ?Result Date: 10/18/2021 ?CLINICAL DATA:  Chest pain EXAM: CHEST - 2 VIEW COMPARISON:  10/17/2021 FINDINGS: Cardiac shadow is mildly enlarged but stable. Postsurgical changes are noted. The lungs are hyperinflated but clear. Minimal scarring is noted in the left base stable from the prior study. No acute bony abnormality is noted. Vascular stent is noted in the left upper arm. IMPRESSION: COPD with minimal basilar scarring. Electronically Signed   By: Shawna [name] M.D.    On: 10/18/2021 23:02   ? ?Cardiac Studies  ? ?LHC from 07/22/21: ? ?Mid LM to Dist LM lesion is 60% stenosed. ?  Prox LAD lesion is 90% stenosed. ?  Ost Cx to Mid Cx lesion is 99% stenosed. ?  Ost RCA to Prox RCA lesion is 100% stenosed. ?  Ost Ramus to Ramus lesion is 80% stenosed. ?  Ost 1st Diag to 1st Diag lesion is 100% stenosed. ?  Ost RPDA to RPDA lesion is 75% stenosed. ?  2nd Mrg lesion is 60% stenosed. ?  Dist LAD lesion is 70% stenosed. ?  LIMA and is large. ?  SVG and is large. ?  SVG and is normal in caliber. ?  SVG and is large. ?  The graft exhibits no disease. ?  The graft exhibits no disease. ?  The graft exhibits no disease. ?  LV end diastolic pressure is mildly elevated. ?  The left ventricular ejection fraction is 35-45% by visual estimate. ?  ?Shawna [name]'s anatomy is for all intents and purposes unchanged from her prior cath performed by Shawna [name] in 2020.  All her grafts are patent.  Her LVEDP was 18.  Her EF looks to be in the 40 to 45% range with an anteroapical wall motion abnormality.  I suspect that her chest pain and mild enzyme leak was related to "demand ischemia".  Medical therapy will be recommended.  If femoral angiogram was performed and the stick appeared to be slightly high and therefore Mynx closure was not performed.  The patient will need a manual hold in the recovery bay prior to going to the floor.  Shawna [name], the patient's attending cardiologist, was notified of these results. ? ?Echo from 07/16/21: ? ? 1. Left ventricular ejection fraction, by estimation, is 30 to 35%. The  ?left ventricle has moderately decreased function. The left ventricle  ?demonstrates regional wall motion abnormalities (see scoring  ?diagram/findings for description). There is mild  ?left ventricular hypertrophy. Left ventricular diastolic parameters are  ?consistent with Grade II diastolic dysfunction (pseudonormalization).  ?Elevated left atrial pressure.  ? 2. Right ventricular systolic function  is moderately reduced. The right  ?ventricular size is normal. There is mildly elevated pulmonary artery  ?systolic pressure. The estimated right ventricular systolic pressure is  ?88.4 mmHg.  ? 3. Left atrial size was severely dilated.  ? 4. Right atrial  size was mildly dilated.  ? 5. The mitral valve is degenerative. Moderate mitral valve regurgitation.  ?Mild to moderate mitral stenosis. MG 74mHg at 70bpm, MVA 1.2 cm^2 by  ?continuity equation. Moderate mitral annular calcification.  ? 6. Tricuspid valve regurgitation is mild to moderate.  ? 7. The aortic valve is tricuspid. Aortic valve regurgitation is not  ?visualized. Aortic valve sclerosis/calcification is present, without any  ?evidence of aortic stenosis.  ? 8. The inferior vena cava is normal in size with greater than 50%  ?respiratory variability, suggesting right atrial pressure of 3 mmHg.  ? ?Patient Profile  ?   ?81y.o. female with PMH of CAD s/p CABG with LIMA-LAD, SVG-OM, RI and distal RCA in 2013 and DES to D1 21194 systolic heart failure, ESRD on HD MWF, chronic anemia, paroxysmal atrial flutter/fib not on anticoagulation due to recurrent GI bleed, cardiology is following since 10/19/21 for chest pain evaluation.  ? ?Assessment & Plan  ?  ?Chest pain ?Elevated troponin  ?CAD  s/p CABG 2013 and DES 2018 ?- presented with chest pain, SOB, left ear pain; aborted dialysis 4/7 due to low BP, resumed full dialysis 4/10, chest pain improved after dialysis on 4/10 but recurred at night, quality similar to previous chest pain prior to CABG and PCI  ?- Hs trop plateau 132>132 >130 >126  ?- EKG today with SR with VR 63bpm, incomplete LBBB, poor R wave progression, no significant change  ?- LHC from 07/2021 with patent grafts, unchanged anatomy, medical treatment was recommended  ?- ACS ruled out, likely chronic angina from distal disease +/- dialysis fluid shift associated discomfort +/- A flutter/fib ?- recommend continue medical therapy with ASA, Crestor,  metoprolol XL; limited anti-angina therapy as BP is already low requiring midodrine, will not increase metoprolol as HR is borderline 60s  ?- cardiology follow up arranged on 10/21/21  ? ?Paroxysmal atria

## 2021-10-20 NOTE — Care Management CC44 (Addendum)
Condition Code 44 Documentation Completed ? ?Patient Details  ?Name: Shawna [name] ?MRN: [id number] ?Date of Birth: 29-Oct-1940 ? ? ?Condition Code 44 given:  Yes ?Patient signature on Condition Code 44 notice:  Yes  ?Documentation of 2 MD's agreement:  Yes ?Code 44 added to claim:  Yes ? ? ? ?Milas Gain, LCSWA ?10/20/2021, 3:50 PM ? ?

## 2021-10-20 NOTE — Plan of Care (Signed)
  Problem: Education: Goal: Knowledge of General Education information will improve Description Including pain rating scale, medication(s)/side effects and non-pharmacologic comfort measures Outcome: Progressing   

## 2021-10-20 NOTE — Progress Notes (Signed)
ReDS Vest / Clip - 10/20/21 0900   ? ?  ? ReDS Vest / Clip  ? Station Marker A   ? Ruler Value 29   ? ReDS Value Range Moderate volume overload   ? ReDS Actual Value 38   ? ?  ?  ? ?  ? ? ?

## 2021-10-20 NOTE — Care Management Obs Status (Signed)
MEDICARE OBSERVATION STATUS NOTIFICATION ? ? ?Patient Details  ?Name: Shawna [name] ?MRN: [id number] ?Date of Birth: 1940-08-02 ? ? ?Medicare Observation Status Notification Given:  Yes ? ? ? ?Milas Gain, LCSWA ?10/20/2021, 3:49 PM ?

## 2021-10-20 NOTE — Progress Notes (Addendum)
?[facility] ?Progress Note  ? ?Subjective:   Seen in room, eating breakfast. Reports she had some chest pain earlier this AM but none at present. Denies SOB, dizziness, abdominal pain, nausea and vomiting. Her main concern is left ear pain.  ? ?Objective ?Vitals:  ? 10/19/21 2100 10/20/21 0004 10/20/21 0450 10/20/21 0500  ?BP: (!) 160/44 139/84 (!) 153/45   ?Pulse: 68 61 63   ?Resp: 20 15 (!) 22   ?Temp: 97.9 ?F (36.6 ?C) 97.9 ?F (36.6 ?C) 97.8 ?F (36.6 ?C)   ?TempSrc: Oral Axillary Axillary   ?SpO2: 97% 95% 96%   ?Weight:    56.7 kg  ?Height:      ? ?Physical Exam ?General: WDWN female, alert and in NAD ?Heart: RRR, no murmurs, rubs or gallops ?Lungs: CTA bilaterally without wheezing, rhonchi or rales ?Abdomen: Soft, non-tender, non-distended, +BS ?Extremities: No edema bilateral lower extremities ?Dialysis Access: LUE AVG +t/b ? ?Additional Objective ?Labs: ?Basic Metabolic Panel: ?Recent Labs  ?Lab 10/17/21 ?1746 10/18/21 ?2217 10/19/21 ?0357  ?NA 141 138 139  ?K 4.6 3.4* 3.6  ?CL 103 98 103  ?CO2 '24 27 28  '$ ?GLUCOSE 100* 161* 92  ?BUN 76* 30* 35*  ?CREATININE 7.95* 4.29* 4.89*  ?CALCIUM 8.9 8.4* 8.3*  ? ?Liver Function Tests: ?No results for input(s): AST, ALT, ALKPHOS, BILITOT, PROT, ALBUMIN in the last 168 hours. ?No results for input(s): LIPASE, AMYLASE in the last 168 hours. ?CBC: ?Recent Labs  ?Lab 10/17/21 ?1746 10/18/21 ?2217 10/19/21 ?0357  ?WBC 10.1 7.6 6.2  ?NEUTROABS 8.0*  --   --   ?HGB 8.5* 8.6* 8.0*  ?HCT 27.8* 27.0* 25.2*  ?MCV 108.2* 107.1* 108.2*  ?PLT PLATELET CLUMPS NOTED ON SMEAR, COUNT APPEARS ADEQUATE PLATELET CLUMPS NOTED ON SMEAR, UNABLE TO ESTIMATE PLATELET CLUMPS NOTED ON SMEAR, UNABLE TO ESTIMATE  ? ?Blood Culture ?   ?Component Value Date/Time  ? SDES PORTA CATH VENOUS AVF 07/16/2021 1755  ? SPECREQUEST  07/16/2021 1755  ?  BOTTLES DRAWN AEROBIC AND ANAEROBIC Blood Culture adequate volume  ? CULT  07/16/2021 1755  ?  NO GROWTH 5 DAYS ?Performed at [facility],  [address] [place], [place] [address] ?  ? REPTSTATUS 07/21/2021 FINAL 07/16/2021 1755  ? ? ?Cardiac Enzymes: ?No results for input(s): CKTOTAL, CKMB, CKMBINDEX, TROPONINI in the last 168 hours. ?CBG: ?Recent Labs  ?Lab 10/19/21 ?7253 10/19/21 ?1741 10/19/21 ?2059 10/20/21 ?0816  ?GLUCAP 82 140* 152* 105*  ? ?Iron Studies: No results for input(s): IRON, TIBC, TRANSFERRIN, FERRITIN in the last 72 hours. ?'@lablastinr3'$ @ ?Studies/Results: ?DG Chest 2 View ? ?Result Date: 10/18/2021 ?CLINICAL DATA:  Chest pain EXAM: CHEST - 2 VIEW COMPARISON:  10/17/2021 FINDINGS: Cardiac shadow is mildly enlarged but stable. Postsurgical changes are noted. The lungs are hyperinflated but clear. Minimal scarring is noted in the left base stable from the prior study. No acute bony abnormality is noted. Vascular stent is noted in the left upper arm. IMPRESSION: COPD with minimal basilar scarring. Electronically Signed   By: Inez Catalina M.D.   On: 10/18/2021 23:02   ?Medications: ? ? ALPRAZolam  0.5 mg Oral QHS  ? amiodarone  100 mg Oral Daily  ? aspirin EC  81 mg Oral Daily  ? cefdinir  300 mg Oral Q M,W,F-HD  ? gabapentin  100 mg Oral BID  ? levothyroxine  25 mcg Oral Daily  ? lidocaine-prilocaine  1 application. Topical Q M,W,F-HD  ? metoprolol succinate  25 mg Oral Daily  ?  midodrine  5-10 mg Oral Q M,W,F-HD  ? multivitamin  1 tablet Oral QHS  ? neomycin-polymyxin-hydrocortisone  3 drop Left EAR Q6H  ? pantoprazole  40 mg Oral Daily  ? rosuvastatin  10 mg Oral Daily  ? sevelamer carbonate  2,400 mg Oral TID WC  ? ? ?Dialysis Orders: ?[facility] - MWF-  3.5 hours  EDW 54kg ?HD Bath 2k/2.5 ca, Cellent 17H KOA, Heparin none. Access LUE AVG BFR 300, DFR 500- 15 guage.  ?Mircera 200 mcg IV every 2 weeks- last dose 10/18/21 ? ?Assessment/Plan: ? NSTEMI: Presented with chest pain, now resolved. Medical management, per cardiology ? ESRD:  On MWF schedule, good compliance. Next HD today 4/12.  ? Hypertension/volume: BP runs soft on dialysis,  requiring midodrine. Above her outpatient EDW but does not appear to have any volume overload on exam or chest x-ray. Discussed increasing treatment time to allow for slower UF rate and patient is agreeable.  ? Anemia: Hgb low but improved compared to recent admission for anemia. Received a dose of mircera on 10/18/21, continue to monitor hemoglobin trend ? Metabolic bone disease: Calcium controlled. Continue home binder (renvela) ? Nutrition:  Renal diet ? ?Anice Paganini, PA-C ?10/20/2021, 8:39 AM  ?[facility] ?Pager: [phone] ? ?Patient seen and examined, agree with above note with above modifications. Seen in room-  good spirits-  no further CP-  due for dialysis later today -  does not appear any further work up needed ? Approaching discharge-   ?Corliss Parish, MD ?10/20/2021 ? ? ? ? ?

## 2021-10-20 NOTE — TOC Transition Note (Addendum)
Transition of Care (TOC) - CM/SW Discharge Note ? ? ?Patient Details  ?Name: Shawna [name] ?MRN: [id number] ?Date of Birth: May 19, 1941 ? ?Transition of Care (TOC) CM/SW Contact:  ?Zenon Mayo, RN ?Phone Number: ?10/20/2021, 4:44 PM ? ? ?Clinical Narrative:    ?Patient is for dc today, she states she has a walker at home , her spouse will transport her home today. She is in HD right now.  She has no needs. ? ? ?Final next level of care: Home/Self Care ?Barriers to Discharge: No Barriers Identified ? ? ?Patient Goals and CMS Choice ?Patient states their goals for this hospitalization and ongoing recovery are:: return home ?  ?Choice offered to / list presented to : NA ? ?Discharge Placement ?  ?           ?  ?  ?  ?  ? ?Discharge Plan and Services ?  ?Discharge Planning Services: CM Consult ?           ?  ?DME Agency: NA ?  ?  ?  ?HH Arranged: NA ?  ?  ?  ?  ? ?Social Determinants of Health (SDOH) Interventions ?  ? ? ?Readmission Risk Interventions ? ?  10/20/2021  ?  4:38 PM 07/23/2021  ?  4:26 PM 11/11/2020  ? 11:38 AM  ?Readmission Risk Prevention Plan  ?Transportation Screening Complete Complete Complete  ?Medication Review Press photographer) Complete Complete Complete  ?PCP or Specialist appointment within 3-5 days of discharge Complete Complete Complete  ?[place] or Home Care Consult Complete Complete Complete  ?SW Recovery Care/Counseling Consult Complete Complete Complete  ?Palliative Care Screening Not Applicable Not Applicable Not Applicable  ?[place] Not Applicable Not Applicable Not Applicable  ? ? ? ? ? ?

## 2021-10-20 NOTE — Progress Notes (Signed)
D/C order noted. Contacted [employer] and spoke to [employer]. Clinic advised pt will d/c today and resume care on Friday. D/C summary and last renal note faxed to clinic for continuation of care.  ? ?Melven Sartorius ?Renal Navigator ?[phone] ?

## 2021-10-21 ENCOUNTER — Ambulatory Visit: Payer: Medicare HMO | Admitting: Cardiology

## 2021-10-21 LAB — HEPATITIS B SURFACE ANTIBODY, QUANTITATIVE: Hep B S AB Quant (Post): 3.1 m[IU]/mL — ABNORMAL LOW (ref >9.9)

## 2021-10-21 NOTE — Progress Notes (Deleted)
? ? ? ?Clinical Summary ?Shawna [name] is a 81 y.o.female ? ?seen today for follow up of the following medical problems.  ?  ?1.CAD ?- history of CABG in 2013, along with DES to D1 in 10/2016 ?- cath in 07/2018 showing patent grafts with occlusion of D1 at prior stent site and progression of PDA disease --> medical management recommended) ?  ?- did not tolerate Ranexa and had hypotension with BB therapy in the past. ?- some chest pain every 2-3 days. Overall stable.  ?  ?  ?06/17/21 ER vsiit with chest pain ?- Trop 23-25 ?- EKG SR, LBBB with PVCs ?- she describes the pain was positional, worst with palpation in the ER.  ?- pain improves with NG.Takes NG 1-2 times per week.  ? ?- Jan 2023 cath: LM 60%, prox LAD 90% and distla 70%, D1 occluded, ostial raums 80%, LCX ostial to mid 99%, OM2 60%. RCA occluded ?LIMA-LAD patent, SVG-raum patent, SVG-OM2 patent, SVG-RCA patent ?Jan 2023 echo: LVEF 30-35%, grade II dd, mod RV dysfunction ? ?10/2021 admission with chest pain, elevated troponin ?- flat trop 130s ?  ?2. Chronic systolic HF ?- new diagnosis during Jan 2023 admission ?- 03/2021 echo LVEF 50% ?-  Jan 2023 echo: LVEF 30-35%, grade II dd, mod RV dysfunction ? Jan 2023 cath with patent grafts.  ?3. Mitral valve disease ?- Jan 2023 echo mod MR, mild to mod MS ?  ?3. HTN ?- compliant with meds, complicated by low bp's on HD.  ?  ?  ?4. Hyperlipidemia ?- 11/2020 TC 123 TG 87 LDL 48  ?- has both simva and rosuvastatin on her list, she is not sure what she is taking.  ?  ?  ?5. PAF/Aflutter ?- not on anticoagulation given history of GIB ?  ?  ?- admit 04/2021 with SVT and PAF. ?- restarted on amiodarone. ?- occasional symptoms with laying down, infrequent.  ? ?-admit 08/2021 with aflutter with RVR ?- converted with IV amio ?-  ?  ?  ?6. NSVT ?- had been on amio according to notes ?- amio was stopped at 12/17/20 discharge in setting of elevated TSH ?- back on amio after 04/2021 admission ?  ?  ?7. ESRD ?- occasional low bp's during  HD ?  ? 8. Hypotension ?- on midodrine ?  ?  ?8. Hypothryoidism ?- followed by endocrine, she is on levothryxins.  ?Past Medical History:  ?Diagnosis Date  ? Acute on chronic respiratory failure with hypoxia ([place]) 10/10/2016  ? Anxiety   ? Arthritis   ? AVM (arteriovenous malformation) of colon   ? CAD (coronary artery disease)   ? a. s/p CABG in 2013 b. DES to D1 in 10/2016. c. cath in 07/2018 showing patent grafts with occlusion of D1 at prior stent site and progression of PDA disease --> medical management recommended  ? Carotid artery disease ([place])   ? a. 23-30% LICA, 0/7622   ? Chronic anemia   ? Chronic bronchitis ([place])   ? Chronic diastolic CHF (congestive heart failure) ([place])   ? a. 02/2012 Echo EF 60-65%, nl wall motion, Gr 1 DD, mod MR  ? Colon cancer ([place]) 1992  ? Esophageal stricture   ? ESRD on hemodialysis ([place])   ? ESRD due to HTN, started dialysis 2011 and gets HD at [facility] with Dr Hinda Lenis on MWF schedule.  Access is LUA AVF as of Sept 2014.   ? GERD (gastroesophageal reflux disease)   ? High cholesterol 12/2011  ?  History of blood transfusion 07/2011; 12/2011; 01/2012 X 2; 04/2012  ? History of gout   ? History of lower GI bleeding   ? Hypertension   ? Iron deficiency anemia   ? Jugular vein occlusion, right ([place])   ? Mitral regurgitation   ? a. Moderate by echo, 02/2012  ? Myocardial infarction [facility])   ? NSVT (nonsustained ventricular tachycardia) ([place])   ? Ovarian cancer ([place]) 1992  ? PAF (paroxysmal atrial fibrillation) ([place])   ? Pneumonia ~ 2009  ? PUD (peptic ulcer disease)   ? TIA (transient ischemic attack)   ? ? ? ?Allergies  ?Allergen Reactions  ? Amlodipine Swelling  ? Aspirin Other (See Comments)  ?  High Doses Mess up her stomach; "makes my bowels have blood in them". ?Takes 81 mg EC Aspirin   ? Nitrofurantoin Hives  ? Penicillins Other (See Comments)  ?  SYNCOPE? , "makes me real weak when I take it; like I'll pass out" ? ?Has patient had a PCN reaction causing immediate rash,  facial/tongue/throat swelling, SOB or lightheadedness with hypotension: Yes ?Has patient had a PCN reaction causing severe rash involving mucus membranes or skin necrosis: no ?Has patient had a PCN reaction that required hospitalization no ?Has patient had a PCN reaction occurring within the last 10 years: no ?If all of the above ?  ? Bactrim [Sulfamethoxazole-Trimethoprim] Rash  ? Contrast Media [Iodinated Contrast Media] Itching  ? Iron Itching and Other (See Comments)  ?  "they gave me iron in dialysis; had to give me Benadryl cause I had to have the iron" (05/02/2012)  ? Tylenol [Acetaminophen] Itching and Other (See Comments)  ?  Makes her feet on fire per pt  ? Gabapentin Other (See Comments)  ?  Unknown reaction  ? Iron Sucrose   ? Ranexa [Ranolazine] Other (See Comments)  ?  Myoclonus-hospitalized   ? Sucroferric Oxyhydroxide   ? Dexilant [Dexlansoprazole] Other (See Comments)  ?  Upset stomach  ? Hydralazine Itching  ?  Has tolerated while inpatient ?Has tolerated while inpatient  ? Levaquin [Levofloxacin In D5w] Rash  ? Morphine And Related Itching and Other (See Comments)  ?  Itching in feet  ? Pantoprazole Rash  ? Plavix [Clopidogrel Bisulfate] Rash  ? Protonix [Pantoprazole Sodium] Rash  ? Venofer [Ferric Oxide] Itching and Other (See Comments)  ?  Patient reports using Benadryl prior to doses as [place]  ? ? ? ?Current Outpatient Medications  ?Medication Sig Dispense Refill  ? ALPRAZolam (XANAX) 0.5 MG tablet Take 0.5 mg by mouth at bedtime.    ? amiodarone (PACERONE) 100 MG tablet Take 1 tablet (100 mg total) by mouth daily. 30 tablet 2  ? aspirin EC 81 MG tablet Take 1 tablet (81 mg total) by mouth daily with breakfast. (Patient taking differently: Take 81-325 mg by mouth daily as needed for mild pain.) 30 tablet 4  ? gabapentin (NEURONTIN) 100 MG capsule Take 100 mg by mouth 2 (two) times daily.    ? hydrOXYzine (VISTARIL) 100 MG capsule Take 100 mg by mouth daily as needed for anxiety.    ?  levothyroxine (SYNTHROID) 25 MCG tablet Take 1 tablet (25 mcg total) by mouth daily. (Patient not taking: Reported on 10/19/2021) 90 tablet 0  ? lidocaine-prilocaine (EMLA) cream Apply 1 application topically every Monday, Wednesday, and Friday. Prior to dialysis    ? loperamide (IMODIUM A-D) 2 MG tablet Take 2 tablets (4 mg total) by mouth daily as needed  for diarrhea or loose stools. (Patient taking differently: Take 1-2 tablets by mouth daily as needed for diarrhea or loose stools.)    ? midodrine (PROAMATINE) 5 MG tablet Take 1-2 tablets (5-10 mg total) by mouth every Monday, Wednesday, and Friday with hemodialysis. 60 tablet 3  ? multivitamin (RENA-VIT) TABS tablet Take 1 tablet by mouth daily.    ? neomycin-polymyxin-hydrocortisone (CORTISPORIN) 3.5-10000-1 OTIC suspension Place 3 drops into the left ear every 6 (six) hours. 10 mL 0  ? nitroGLYCERIN (NITROSTAT) 0.4 MG SL tablet Place 1 tablet (0.4 mg total) under the tongue every 5 (five) minutes x 3 doses as needed for chest pain (if no relief after 2nd dose, proceed to the ED for an evaluation or call 911). 25 tablet 3  ? omeprazole (PRILOSEC) 20 MG capsule Take 1 capsule (20 mg total) by mouth daily. (Patient taking differently: Take 40 mg by mouth daily as needed (acid reflux).) 90 capsule 3  ? rosuvastatin (CRESTOR) 10 MG tablet Take 1 tablet (10 mg total) by mouth daily. 90 tablet 3  ? sevelamer carbonate (RENVELA) 800 MG tablet Take 3 tablets (2,400 mg total) by mouth 3 (three) times daily with meals. 270 tablet 4  ? ?No current facility-administered medications for this visit.  ? ? ? ?Past Surgical History:  ?Procedure Laterality Date  ? A/V SHUNTOGRAM Left 03/19/2019  ? Procedure: A/V SHUNTOGRAM;  Surgeon: Katha Cabal, MD;  Location: [facility];  Service: Cardiovascular;  Laterality: Left;  ? ABDOMINAL HYSTERECTOMY  1992  ? APPENDECTOMY  06/1990  ? AV FISTULA PLACEMENT  07/2009  ? left upper arm  ? AV FISTULA PLACEMENT Right 09/06/2016  ?  Procedure: RIGHT FOREARM ARTERIOVENOUS (AV) GRAFT;  Surgeon: Elam Dutch, MD;  Location: [facility];  Service: Vascular;  Laterality: Right;  ? AV FISTULA PLACEMENT N/A 02/24/2017  ? Procedure: INSERTION OF ARTE

## 2021-10-29 ENCOUNTER — Encounter (HOSPITAL_COMMUNITY): Payer: Self-pay

## 2021-10-29 ENCOUNTER — Emergency Department (HOSPITAL_COMMUNITY): Payer: Medicare HMO

## 2021-10-29 ENCOUNTER — Emergency Department (HOSPITAL_COMMUNITY)
Admission: EM | Admit: 2021-10-29 | Discharge: 2021-10-29 | Disposition: A | Discharge status: Home / Self Care | Payer: Medicare HMO | Attending: Emergency Medicine | Admitting: Emergency Medicine

## 2021-10-29 ENCOUNTER — Other Ambulatory Visit: Payer: Self-pay

## 2021-10-29 DIAGNOSIS — Z992 Dependence on renal dialysis: Secondary | ICD-10-CM | POA: Insufficient documentation

## 2021-10-29 DIAGNOSIS — Z7982 Long term (current) use of aspirin: Secondary | ICD-10-CM | POA: Diagnosis not present

## 2021-10-29 DIAGNOSIS — I2510 Atherosclerotic heart disease of native coronary artery without angina pectoris: Secondary | ICD-10-CM | POA: Diagnosis not present

## 2021-10-29 DIAGNOSIS — Z792 Long term (current) use of antibiotics: Secondary | ICD-10-CM | POA: Diagnosis not present

## 2021-10-29 DIAGNOSIS — I509 Heart failure, unspecified: Secondary | ICD-10-CM | POA: Diagnosis not present

## 2021-10-29 DIAGNOSIS — H9202 Otalgia, left ear: Secondary | ICD-10-CM | POA: Insufficient documentation

## 2021-10-29 DIAGNOSIS — H7092 Unspecified mastoiditis, left ear: Secondary | ICD-10-CM

## 2021-10-29 DIAGNOSIS — R531 Weakness: Secondary | ICD-10-CM | POA: Diagnosis not present

## 2021-10-29 DIAGNOSIS — H60312 Diffuse otitis externa, left ear: Secondary | ICD-10-CM

## 2021-10-29 DIAGNOSIS — N186 End stage renal disease: Secondary | ICD-10-CM | POA: Insufficient documentation

## 2021-10-29 LAB — CBC
HCT: 22.8 % — ABNORMAL LOW (ref 36.0–46.0)
Hemoglobin: 7.3 g/dL — ABNORMAL LOW (ref 12.0–15.0)
MCH: 34.9 pg — ABNORMAL HIGH (ref 26.0–34.0)
MCHC: 32 g/dL (ref 30.0–36.0)
MCV: 109.1 fL — ABNORMAL HIGH (ref 80.0–100.0)
Platelets: UNDETERMINED 10*3/uL (ref 150–400)
RBC: 2.09 MIL/uL — ABNORMAL LOW (ref 3.87–5.11)
RDW: 19.9 % — ABNORMAL HIGH (ref 11.5–15.5)
WBC: 7.3 10*3/uL (ref 4.0–10.5)
nRBC: 0 % (ref 0.0–0.2)

## 2021-10-29 LAB — BASIC METABOLIC PANEL
Anion gap: 12 (ref 5–15)
BUN: 48 mg/dL — ABNORMAL HIGH (ref 8–23)
CO2: 26 mmol/L (ref 22–32)
Calcium: 8.5 mg/dL — ABNORMAL LOW (ref 8.9–10.3)
Chloride: 103 mmol/L (ref 98–111)
Creatinine, Ser: 7.91 mg/dL — ABNORMAL HIGH (ref 0.44–1.00)
GFR, Estimated: 5 mL/min — ABNORMAL LOW (ref >60)
Glucose, Bld: 88 mg/dL (ref 70–99)
Potassium: 3.8 mmol/L (ref 3.5–5.1)
Sodium: 141 mmol/L (ref 135–145)

## 2021-10-29 LAB — CBG MONITORING, ED: Glucose-Capillary: 129 mg/dL — ABNORMAL HIGH (ref 70–99)

## 2021-10-29 LAB — TROPONIN I (HIGH SENSITIVITY): Troponin I (High Sensitivity): 63 ng/L — ABNORMAL HIGH (ref <18)

## 2021-10-29 LAB — BRAIN NATRIURETIC PEPTIDE: B Natriuretic Peptide: 1743.6 pg/mL — ABNORMAL HIGH (ref 0.0–100.0)

## 2021-10-29 IMAGING — CT CT TEMPORAL BONES W/O CM
3 of 7 series · 14 of 40 positions shown, 16 images · non-contrast
Comparison: Head CT [DATE].

CLINICAL DATA: Provided history: Left ear mass ? Additional history
provided: Weakness, possible left ear mass.

EXAM:
CT TEMPORAL BONES WITHOUT CONTRAST
TECHNIQUE: Axial and coronal plane CT imaging of the petrous temporal bones was
performed with thin-collimation image reconstruction. No intravenous
contrast was administered. Multiplanar CT image reconstructions were
also generated.
RADIATION DOSE REDUCTION: This exam was performed according to the
departmental dose-optimization program which includes automated
exposure control, adjustment of the mA and/or kV according to
patient size and/or use of iterative reconstruction technique.

[Series 4: temporal bone 0.6 u75u · axial · 0.49mm/px · z∈[-132,-56]mm · 7 of 172 slices shown, 9 images]
[im 22/172  brain]
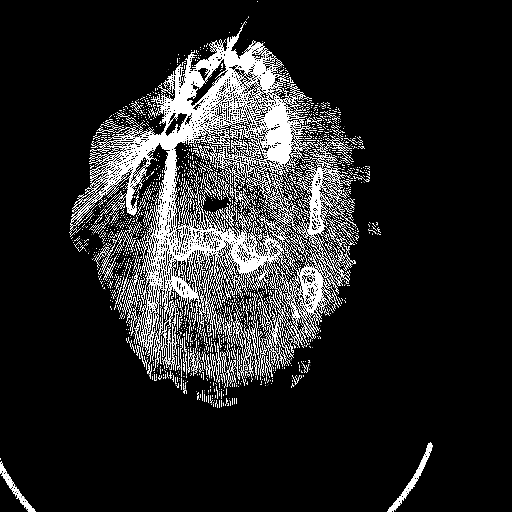
[im 22/172  bone]
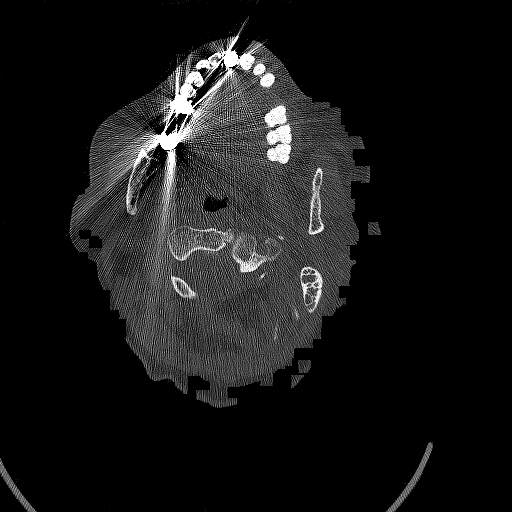
[im 43/172  bone]
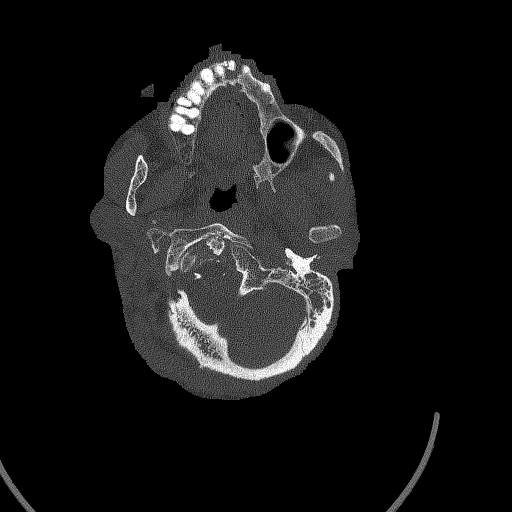
[im 65/172  bone]
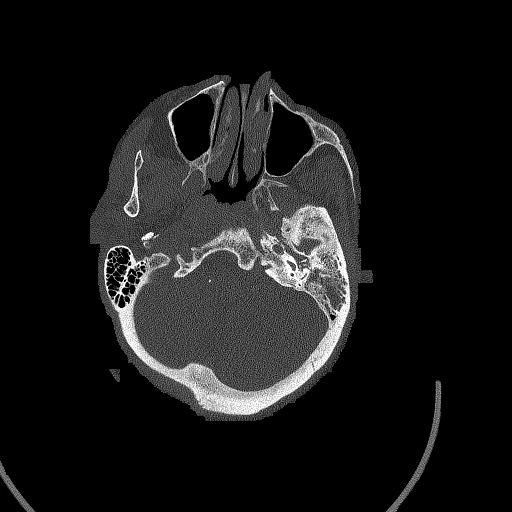
[im 86/172  bone]
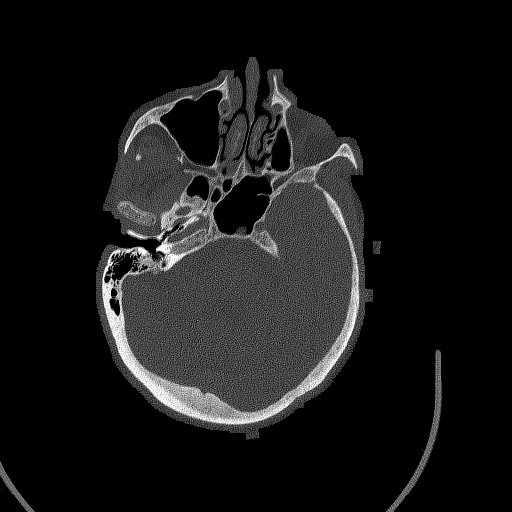
[im 107/172  brain]
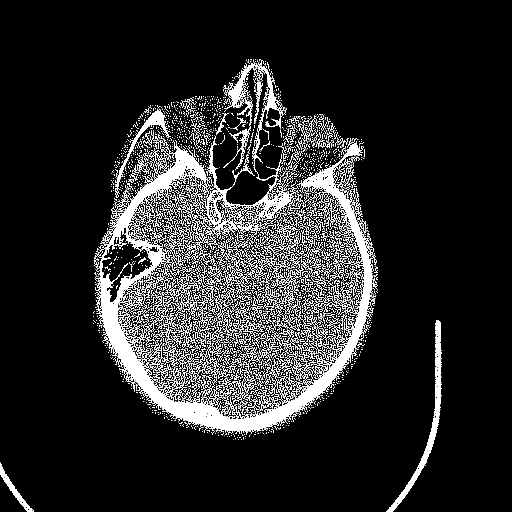
[im 107/172  bone]
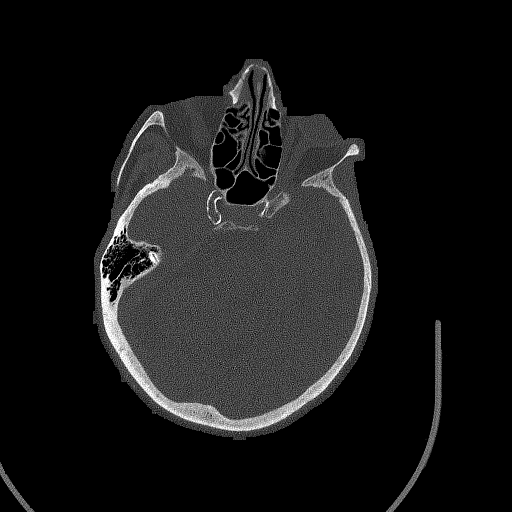
[im 129/172  bone]
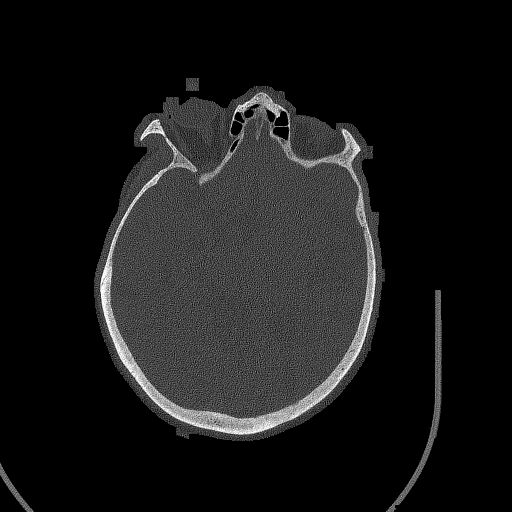
[im 150/172  bone]
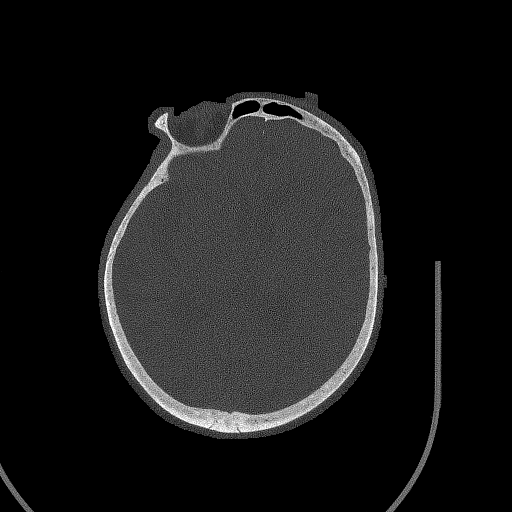

[Series 5: coronals · coronal · 0.22mm/px · 2 of 264 slices shown]
[im 88/264  bone]
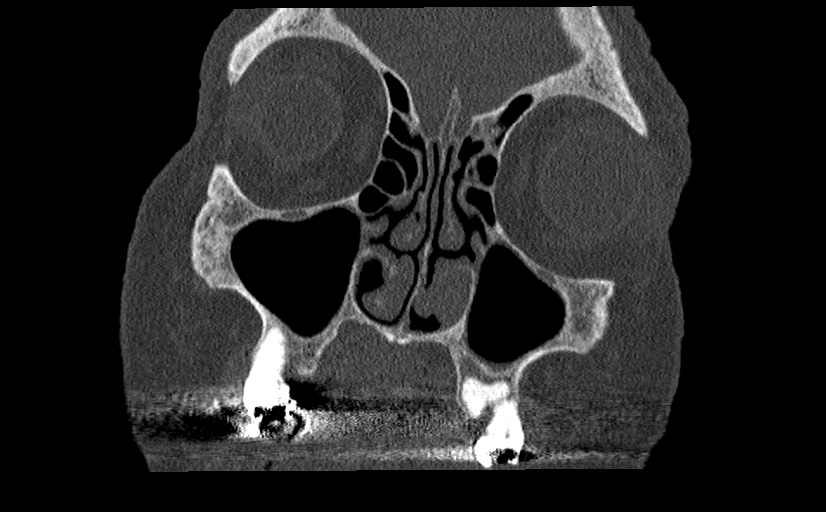
[im 176/264  bone]
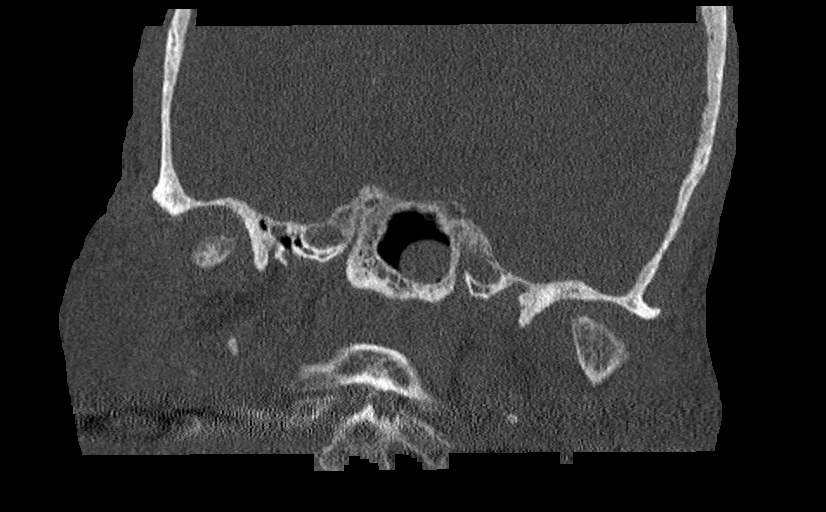

[Series 6: temporal bone left · axial · 0.21mm/px · z∈[-132,-81]mm · 5 of 172 slices shown]
[im 22/172  bone]
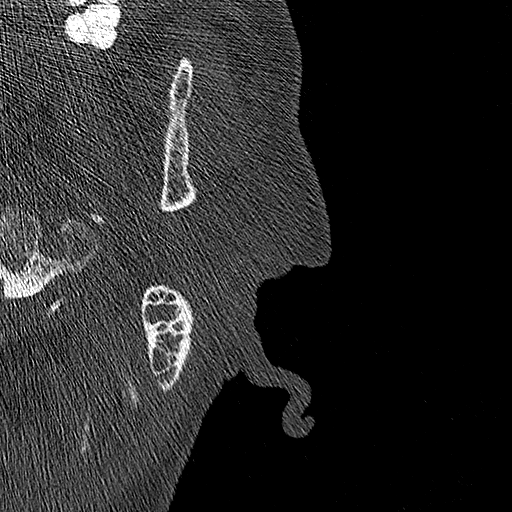
[im 43/172  bone]
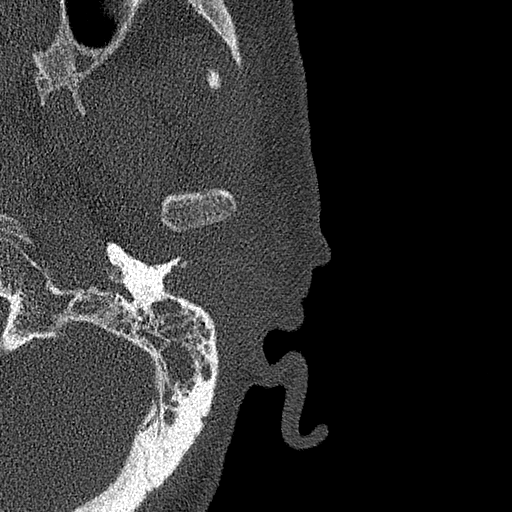
[im 65/172  bone]
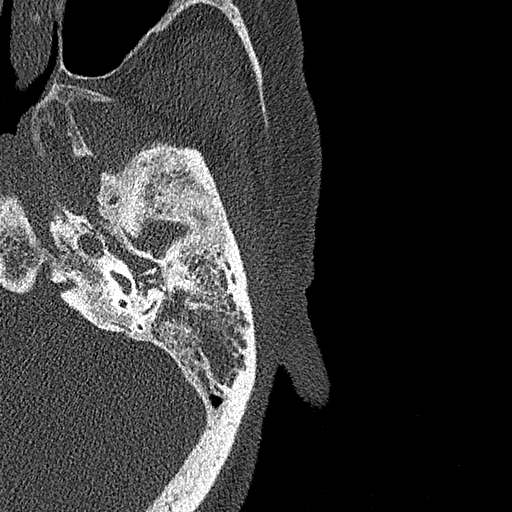
[im 86/172  bone]
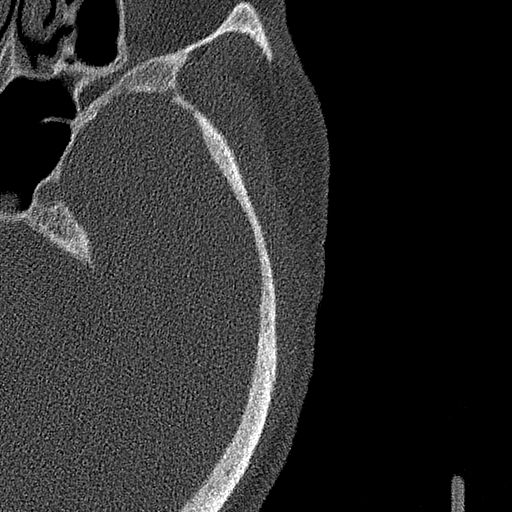
[im 107/172  bone]
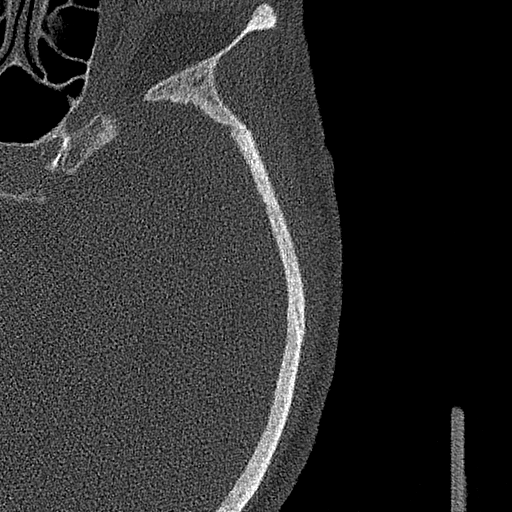

[14 of 40 positions shown; findings below may reference images not displayed]

FINDINGS: RIGHT TEMPORAL BONE

External auditory canal: Patent.

Middle ear cavity: Normally aerated. The scutum and ossicles are
normal. The tegmen tympani is intact.

Inner ear structures: The cochlea, vestibule and semicircular canals
are normal. The vestibular aqueduct is not enlarged.

Internal auditory and facial nerve canals:  Unremarkable.

Mastoid air cells: Normally aerated. No osseous erosion.

LEFT TEMPORAL BONE

External auditory canal: Complete opacification of the external
auditory canal.

Middle ear cavity: Complete opacification of the middle ear cavity.
The scutum remain sharp. No definite ossicular erosion is
appreciated. The tegmen tympani appears intact.

Inner ear structures: The cochlea, vestibule and semicircular canals
are normal. The vestibular aqueduct is not enlarged.

Internal auditory and facial nerve canals:  Unremarkable.

Mastoid air cells: Near complete opacification of the mastoid air
cells. A small focus of osseous erosion is questioned at the
anterior aspect of the mastoid air cells/posterior aspect of the
external auditory canal (series 6, image 55).

Vascular: Normal non-contrast appearance of the carotid canals,
jugular bulbs and sigmoid plates.

Limited intracranial: Parenchymal atrophy and chronic small vessel
ischemic disease. No appreciable acute intracranial abnormality
within the field of view.

Visible orbits/paranasal sinuses: 11 mm mucous retention cyst within
the left sphenoid sinus. Elsewhere, there is trace scattered
paranasal sinus mucosal thickening. No orbital mass or acute orbital
finding.

Soft tissues: Soft tissue swelling about the left ear and within the
left temporal scalp.
IMPRESSION: Complete opacification of the left external auditory canal and left
middle ear cavity. Additionally, there is near complete
opacification of the left mastoid air cells. A small focus of
osseous erosion is questioned at the anterior aspect of the left
mastoid air cells/posterior aspect of the left external auditory
canal. There was only moderate debris within the left external
auditory canal on the prior head CT of [DATE], and the left
middle ear cavity and left mastoid air cells were normally aerated
on this recent prior study. Thus, the findings on today's
examination are favored to reflect acute otomastoiditis and external
otitis. ENT consultation is recommended.

Unremarkable CT appearance of the right temporal bone.

## 2021-10-29 IMAGING — DX DG CHEST 1V PORT
1 series · 1 of 1 positions shown · non-contrast
Comparison: Chest radiograph dated [DATE]

CLINICAL DATA: Weakness.

EXAM:
PORTABLE CHEST 1 VIEW

[chest ap]
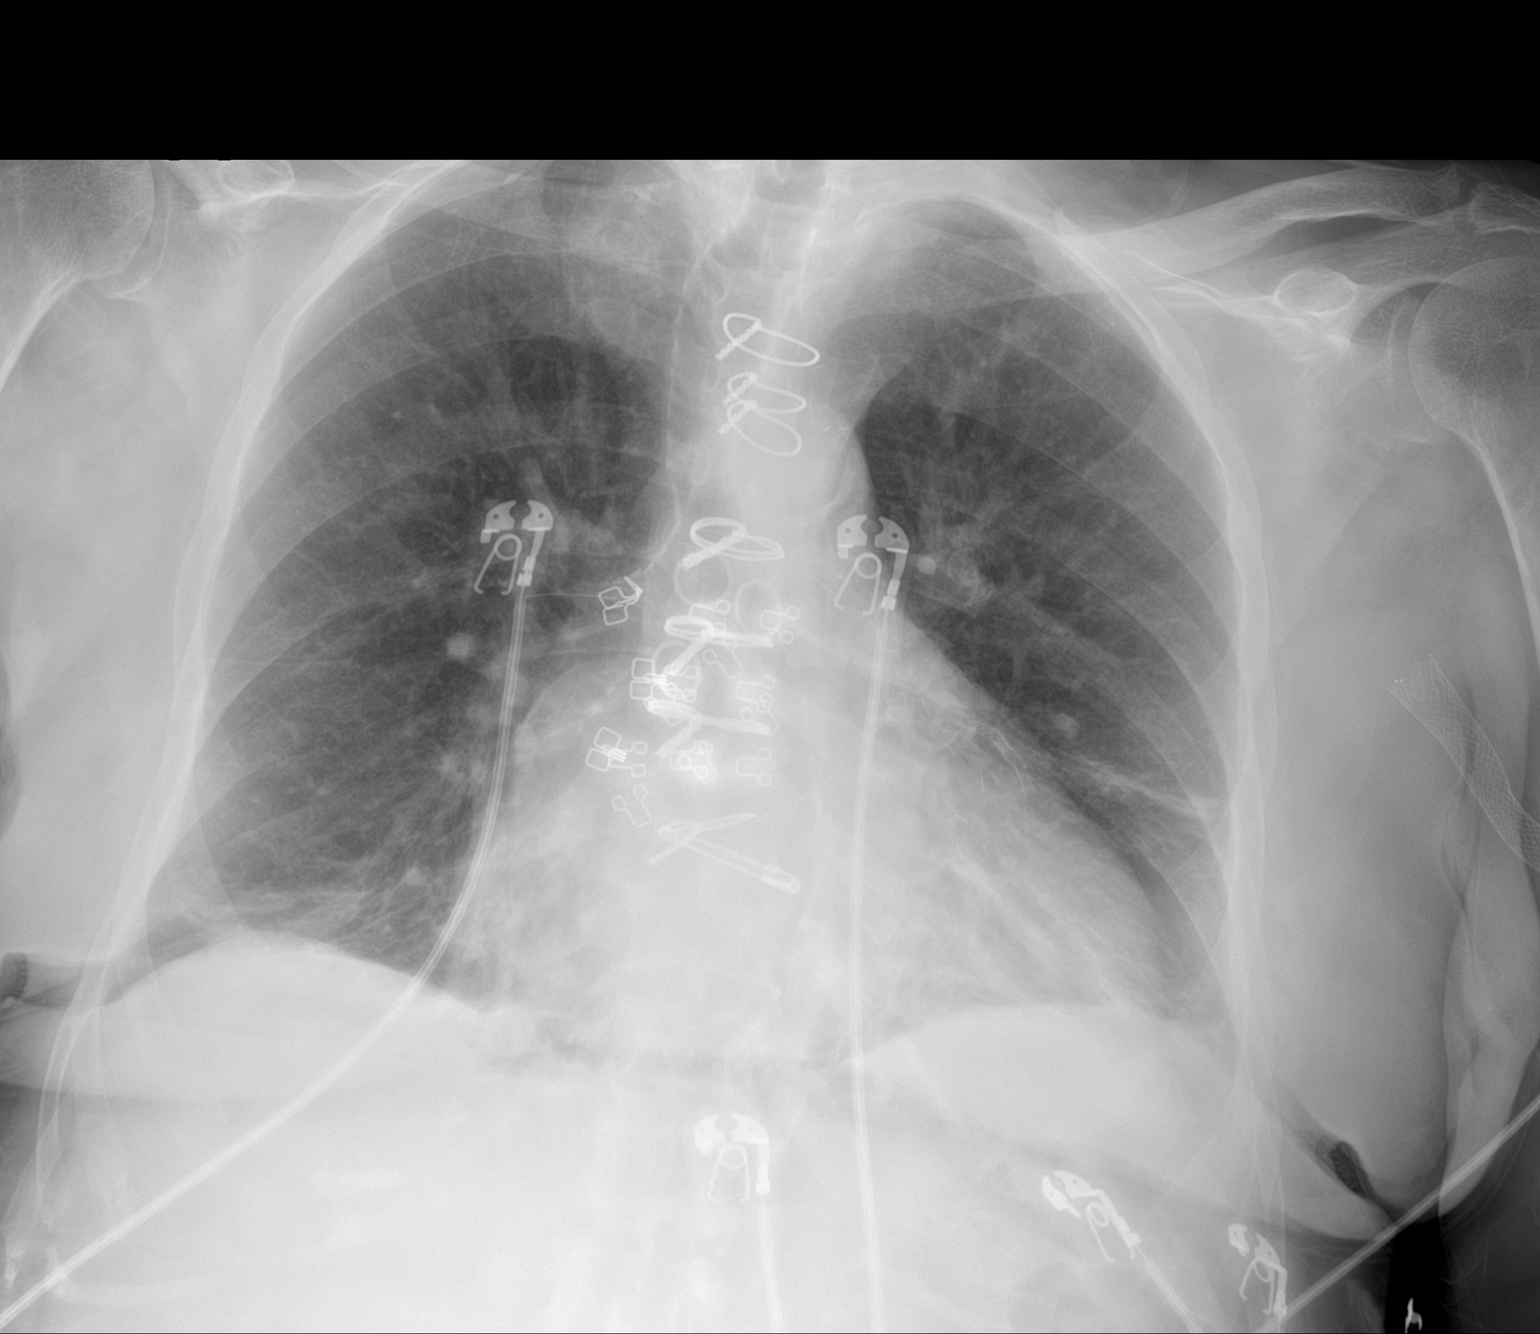

[1 of 1 positions shown; findings below may reference images not displayed]

FINDINGS: Heart is mildly enlarged. Evidence of prior coronary artery bypass
grafting. Atherosclerotic calcification of aorta. Hyperinflated
lungs. Bibasilar atelectasis. No focal consolidation or pleural
effusion.
IMPRESSION: 1. Stable cardiomegaly.
2. Bibasilar atelectasis without evidence of pneumonia or pulmonary
edema.

## 2021-10-29 MED ORDER — CIPROFLOXACIN-DEXAMETHASONE 0.3-0.1 % OT SUSP
4.0000 [drp] | Freq: Once | OTIC | Status: AC
Start: 2021-10-29 — End: 2021-10-29
  Administered 2021-10-29: 4 [drp] via OTIC
  Filled 2021-10-29: qty 7.5

## 2021-10-29 MED ORDER — OXYCODONE HCL 5 MG PO TABS: 5.0000 mg | ORAL_TABLET | ORAL | 0 refills | Status: DC | PRN

## 2021-10-29 MED ORDER — OXYCODONE HCL 5 MG PO TABS
5.0000 mg | ORAL_TABLET | Freq: Once | ORAL | Status: AC
Start: 2021-10-29 — End: 2021-10-29
  Administered 2021-10-29: 5 mg via ORAL
  Filled 2021-10-29: qty 1

## 2021-10-29 NOTE — ED Provider Notes (Signed)
?[facility] ?Provider Note ? ? ?CSN: [id number] ?Arrival date & time: 10/29/21  1321 ? ?  ? ?History ? ?Chief Complaint  ?Patient presents with  ? Weakness  ? ? ?Shawna [name] is a 81 y.o. female. ? ?Patient here with some generalized weakness but mostly left ear pain.  History of CKD on dialysis.  Was.  Dialysis today.  She has been on antibiotics for ear pain but continues to have discomfort.  Not sleeping well.  Making it difficult for her to get sleep and thus making her feel extra week.  She denies any chest pain or shortness of breath.  She was supposed to go to dialysis today but came here due to the ear pain.  Patient has history of reflux, CAD, heart failure. ? ?The history is provided by the patient.  ? ?  ? ?Home Medications ?Prior to Admission medications   ?Medication Sig Start Date End Date Taking? Authorizing Provider  ?ALPRAZolam (XANAX) 0.5 MG tablet Take 0.5 mg by mouth at bedtime. 03/20/21   [provider]  ?amiodarone (PACERONE) 100 MG tablet Take 1 tablet (100 mg total) by mouth daily. 08/20/21   Roxan Hockey, MD  ?aspirin EC 81 MG tablet Take 1 tablet (81 mg total) by mouth daily with breakfast. ?Patient taking differently: Take 81-325 mg by mouth daily as needed for mild pain. 08/20/21   Roxan Hockey, MD  ?gabapentin (NEURONTIN) 100 MG capsule Take 100 mg by mouth 2 (two) times daily. 10/06/21   [provider]  ?hydrOXYzine (VISTARIL) 100 MG capsule Take 100 mg by mouth daily as needed for anxiety. 11/23/20   [provider]  ?levothyroxine (SYNTHROID) 25 MCG tablet Take 1 tablet (25 mcg total) by mouth daily. ?Patient not taking: Reported on 10/19/2021 02/02/21   Brita Romp, NP  ?lidocaine-prilocaine (EMLA) cream Apply 1 application topically every Monday, Wednesday, and Friday. Prior to dialysis    [provider]  ?loperamide (IMODIUM A-D) 2 MG tablet Take 2 tablets (4 mg total) by mouth daily as needed for  diarrhea or loose stools. ?Patient taking differently: Take 1-2 tablets by mouth daily as needed for diarrhea or loose stools. 12/17/20   Thurnell Lose, MD  ?midodrine (PROAMATINE) 5 MG tablet Take 1-2 tablets (5-10 mg total) by mouth every Monday, Wednesday, and Friday with hemodialysis. 10/20/21   Nita Sells, MD  ?multivitamin (RENA-VIT) TABS tablet Take 1 tablet by mouth daily.    [provider]  ?neomycin-polymyxin-hydrocortisone (CORTISPORIN) 3.5-10000-1 OTIC suspension Place 3 drops into the left ear every 6 (six) hours. 10/20/21   Nita Sells, MD  ?nitroGLYCERIN (NITROSTAT) 0.4 MG SL tablet Place 1 tablet (0.4 mg total) under the tongue every 5 (five) minutes x 3 doses as needed for chest pain (if no relief after 2nd dose, proceed to the ED for an evaluation or call 911). 02/19/21   Verta Ellen., NP  ?omeprazole (PRILOSEC) 20 MG capsule Take 1 capsule (20 mg total) by mouth daily. ?Patient taking differently: Take 40 mg by mouth daily as needed (acid reflux). 12/01/20   Rogene Houston, MD  ?rosuvastatin (CRESTOR) 10 MG tablet Take 1 tablet (10 mg total) by mouth daily. 05/13/21   Strader, Fransisco Hertz, PA-C  ?sevelamer carbonate (RENVELA) 800 MG tablet Take 3 tablets (2,400 mg total) by mouth 3 (three) times daily with meals. 08/20/21   Roxan Hockey, MD  ?   ? ?Allergies    ?Amlodipine, Aspirin, Nitrofurantoin,  Penicillins, Bactrim [sulfamethoxazole-trimethoprim], Contrast media [iodinated contrast media], Iron, Tylenol [acetaminophen], Gabapentin, Iron sucrose, Ranexa [ranolazine], Sucroferric oxyhydroxide, Dexilant [dexlansoprazole], Hydralazine, Levaquin [levofloxacin in d5w], Morphine and related, Pantoprazole, Plavix [clopidogrel bisulfate], Protonix [pantoprazole sodium], and Venofer [ferric oxide]   ? ?Review of Systems   ?Review of Systems ? ?Physical Exam ?Updated Vital Signs ?BP (!) 147/39 (BP Location: Left Arm)   Pulse (!) 57   Temp 98.9 ?F (37.2 ?C) (Oral)    Resp 16   Ht '5\' 1"'$  (1.549 m)   Wt 55.8 kg   SpO2 97%   BMI 23.24 kg/m?  ?Physical Exam ?Vitals and nursing note reviewed.  ?Constitutional:   ?   General: She is not in acute distress. ?   Appearance: She is well-developed.  ?HENT:  ?   Head: Normocephalic and atraumatic.  ?   Comments: Appears there is may be some soft tissue lesion within the left external auditory canal with some drainage, the mastoid area is normal on the left, right ear exam is unremarkable ?Eyes:  ?   Conjunctiva/sclera: Conjunctivae normal.  ?Cardiovascular:  ?   Rate and Rhythm: Normal rate and regular rhythm.  ?   Pulses: Normal pulses.  ?   Heart sounds: Normal heart sounds. No murmur heard. ?Pulmonary:  ?   Effort: Pulmonary effort is normal. No respiratory distress.  ?   Breath sounds: Normal breath sounds.  ?Abdominal:  ?   Palpations: Abdomen is soft.  ?   Tenderness: There is no abdominal tenderness.  ?Musculoskeletal:     ?   General: No swelling.  ?   Cervical back: Normal range of motion and neck supple.  ?Skin: ?   General: Skin is warm and dry.  ?   Capillary Refill: Capillary refill takes less than 2 seconds.  ?Neurological:  ?   General: No focal deficit present.  ?   Mental Status: She is alert.  ?Psychiatric:     ?   Mood and Affect: Mood normal.  ? ? ?ED Results / Procedures / Treatments   ?Labs ?(all labs ordered are listed, but only abnormal results are displayed) ?Labs Reviewed  ?CBG MONITORING, ED - Abnormal; Notable for the following components:  ?    Result Value  ? Glucose-Capillary 129 (*)   ? All other components within normal limits  ?CBC  ?URINALYSIS, ROUTINE W REFLEX MICROSCOPIC  ?BRAIN NATRIURETIC PEPTIDE  ?TROPONIN I (HIGH SENSITIVITY)  ? ? ?EKG ?EKG Interpretation ? ?Date/Time:  Friday October 29 2021 14:32:43 EDT ?Ventricular Rate:  56 ?PR Interval:  184 ?QRS Duration: 121 ?QT Interval:  510 ?QTC Calculation: 493 ?R Axis:   17 ?Text Interpretation: Sinus rhythm Left bundle branch block No significant change  was found Confirmed by Ronnald Nian, Jabri Blancett ([id number]) on 10/29/2021 2:42:17 PM ? ?Radiology ?DG Chest Portable 1 View ? ?Result Date: 10/29/2021 ?CLINICAL DATA:  Weakness. EXAM: PORTABLE CHEST 1 VIEW COMPARISON:  Chest radiograph dated October 18, 2021 FINDINGS: Heart is mildly enlarged. Evidence of prior coronary artery bypass grafting. Atherosclerotic calcification of aorta. Hyperinflated lungs. Bibasilar atelectasis. No focal consolidation or pleural effusion. IMPRESSION: 1. Stable cardiomegaly. 2. Bibasilar atelectasis without evidence of pneumonia or pulmonary edema. Electronically Signed   By: Keane Police D.O.   On: 10/29/2021 14:55   ? ?Procedures ?Procedures  ? ? ?Medications Ordered in ED ?Medications - No data to display ? ?ED Course/ Medical Decision Making/ A&P ?  ?                        ?  Medical Decision Making ?Amount and/or Complexity of Data Reviewed ?Labs: ordered. ?Radiology: ordered. ? ? ?Shawna [name] is here mostly for left ear pain.  History of end-stage renal disease the.  On hemodialysis.  Did not go to her dialysis session today.  Has been dealing with left ear pain for the last several weeks.  Currently on antibiotics, Omnicef.  Has had a referral to ENT set up by primary care doctor in [place] but has not heard back from them yet.  She is feeling weak and tired because she has not been able to sleep secondary to the pain.  Denies any major respiratory symptoms.  On exam the left external auditory canal appears to have some sort of soft tissue structure.  This does not quite look like otitis externa but could be some sequelae of chronic otitis externa.  Does not appear to have evidence of mastoiditis on exam.  Talked with Dr. Benjamine Mola with ENT who recommends a temporal CT and overall no major findings on that can follow-up with him outpatient.  Continue her Omnicef at this time.  We will get lab work to evaluate for need for emergent dialysis.  EKG shows sinus rhythm.  No ischemic changes.  Unchanged from  prior EKGs.  I have called our renal navigator who has arranged for her to do a make-up session at 1130 tomorrow morning at her normal dialysis facility assuming lab work is unremarkable.  Chest x-ray per

## 2021-10-29 NOTE — ED Notes (Signed)
Pt called, appears upset that they've been here for 4hrs. Explained that CT scan result is still pending and delay in lab results d/t pt being a hardstick. Pt seemed to not understand despite explanation, states "I've never been anywhere in my 80 years that took this long". Per husband, pt is not here for weakness but only for earache. Pt states "I don't think my weakness is bad enough, I worked today".  ?

## 2021-10-29 NOTE — ED Provider Notes (Signed)
Ultrasound ED Peripheral IV (Provider) ? ?Date/Time: 10/29/2021 4:26 PM ?Performed by: Tedd Sias, PA ?Authorized by: Tedd Sias, PA  ? ?Procedure details:  ?  Skin Prep: chlorhexidine gluconate   ?  Location:  Right AC ?  Angiocath:  20 G ?  Bedside Ultrasound Guided: Yes   ?  Images: not archived   ?  Patient tolerated procedure without complications: Yes   ?  Dressing applied: Yes   ?Comments:  ?   1 attempt ? ? ?  ?Tedd Sias, [place] ?10/29/21 1626 ? ?  ?Drenda Freeze, MD ?10/29/21 2300 ? ?

## 2021-10-29 NOTE — ED Provider Notes (Signed)
?  Physical Exam  ?BP (!) 116/99   Pulse (!) 55   Temp 98.9 ?F (37.2 ?C) (Oral)   Resp 17   Ht '5\' 1"'$  (1.549 m)   Wt 55.8 kg   SpO2 95%   BMI 23.24 kg/m?  ? ?Physical Exam ? ?Procedures  ?Procedures ? ?ED Course / MDM  ?  ?Medical Decision Making ?Here and assumed at 4 PM.  Patient is a dialysis patient with left ear pain.  Patient is already on Omnicef and neomycin drops.  Patient missed her dialysis today to come here.  CT temporal bone and labs pending at signout. ? ?6:08 PM ?CT temporal bone showed otomastoiditis and external otitis.  Labs at baseline.  Elevated troponin is expected for dialysis and patient is able to get a make-up session tomorrow at her dialysis center.  Potassium is 3.8.  I discussed case with Dr. Benjamine Mola again.  He reviewed the images.  He recommend continue Omnicef and patient can be switched to Ciprodex.  We will also give her pain medicine as well.  Patient will follow-up with him on Monday at 1 PM in his office ? ?Problems Addressed: ?Acute diffuse otitis externa of left ear: acute illness or injury ?ESRD (end stage renal disease) on dialysis [facility]): acute illness or injury ?Left ear pain: acute illness or injury ?Mastoiditis of left side: acute illness or injury ? ?Amount and/or Complexity of Data Reviewed ?Labs: ordered. ?Radiology: ordered. ? ?Risk ?Prescription drug management. ? ? ? ? ? ? ? ?  ?Drenda Freeze, MD ?10/29/21 1811 ? ?

## 2021-10-29 NOTE — ED Triage Notes (Signed)
Pt arrived POV from home c/o of not being able to get around or do anything. Pt states I cannot push myself in the wheelchair and my husband has to make me food. Pt also endorses a toothache.  ?

## 2021-10-29 NOTE — ED Notes (Signed)
Informed MD re: delay in labs/ pt hard stick.  ?

## 2021-10-29 NOTE — Discharge Instructions (Addendum)
Please go to dialysis tomorrow morning at 11:30 AM.  You have been scheduled for make up. ? ?You have a bad ear infection that involves your facial bone ? ?You need to continue taking Omnicef as prescribed by your doctor ? ?You should switch your eardrops to Ciprodex 4 drops twice daily to the left ear. ? ?Take oxycodone for pain ? ?Dr. Benjamine Mola wants to see you Monday at 1 PM.  Please call his office on Monday morning to confirm ? ?Return to ER if you have worse ear pain, fever, purulent drainage, weakness ?

## 2021-10-29 NOTE — ED Notes (Signed)
2 failed attempts to collect labs 

## 2021-10-29 NOTE — Progress Notes (Signed)
Contacted by ED MD to inquire if pt can receive out-pt HD treatment tomorrow to make up for today's missed treatment. Contacted [facility] and spoke to [place]. Clinic can provide pt a treatment tomorrow. Pt will need to arrive at 11:30. Update provided to ED MD with above info. Unable to reach pt via phone.Spoke to pt's husband via phone. Pt's husband advised clinic needs pt to arrive at 11:30 tomorrow. Pt's husband voices understanding and agreeable to plan.  ? ?Melven Sartorius ?Renal Navigator ?[phone]  ?

## 2021-11-04 ENCOUNTER — Encounter (INDEPENDENT_AMBULATORY_CARE_PROVIDER_SITE_OTHER): Payer: Self-pay | Admitting: Vascular Surgery

## 2021-11-04 ENCOUNTER — Ambulatory Visit (INDEPENDENT_AMBULATORY_CARE_PROVIDER_SITE_OTHER): Payer: Medicare HMO

## 2021-11-04 ENCOUNTER — Ambulatory Visit (INDEPENDENT_AMBULATORY_CARE_PROVIDER_SITE_OTHER): Payer: Medicare HMO | Admitting: Vascular Surgery

## 2021-11-04 VITALS — BP 150/71 | HR 59 | Resp 17 | Ht 61.0 in

## 2021-11-04 DIAGNOSIS — Z992 Dependence on renal dialysis: Secondary | ICD-10-CM | POA: Diagnosis not present

## 2021-11-04 DIAGNOSIS — E785 Hyperlipidemia, unspecified: Secondary | ICD-10-CM

## 2021-11-04 DIAGNOSIS — I25118 Atherosclerotic heart disease of native coronary artery with other forms of angina pectoris: Secondary | ICD-10-CM | POA: Diagnosis not present

## 2021-11-04 DIAGNOSIS — I6523 Occlusion and stenosis of bilateral carotid arteries: Secondary | ICD-10-CM

## 2021-11-04 DIAGNOSIS — K219 Gastro-esophageal reflux disease without esophagitis: Secondary | ICD-10-CM

## 2021-11-04 DIAGNOSIS — I10 Essential (primary) hypertension: Secondary | ICD-10-CM

## 2021-11-04 DIAGNOSIS — N186 End stage renal disease: Secondary | ICD-10-CM | POA: Diagnosis not present

## 2021-11-04 NOTE — Progress Notes (Signed)
? ? ?MRN : [id number] ? ?Shawna [name] is a 81 y.o. (11-Sep-1940) female who presents with chief complaint of check my access. ? ?History of Present Illness:  ? ?The patient returns to the office for followup of their dialysis access.  The function of the access has been stable. The patient denies increased bleeding time or increased recirculation. Patient does not some difficulty with cannulation. The patient denies hand pain or other symptoms consistent with steal phenomena.  No significant arm swelling. ?  ?The patient denies redness or swelling at the access site. The patient denies fever or chills at home or while on dialysis. ?  ?The patient denies amaurosis fugax or recent TIA symptoms. There are no recent neurological changes noted. ?The patient denies claudication symptoms or rest pain symptoms. ?The patient denies history of DVT, PE or superficial thrombophlebitis. ?The patient denies recent episodes of angina or shortness of breath.  ?  ?Today the patient has a flow volume of 1111 cm/min (previous flow volume of 1643).  There is an area of significant stenosis in the mid portion of the access. ?  ? ?No outpatient medications have been marked as taking for the 11/04/21 encounter (Appointment) with Delana Meyer, Dolores Lory, MD.  ? ? ?Past Medical History:  ?Diagnosis Date  ? Acute on chronic respiratory failure with hypoxia ([place]) 10/10/2016  ? Anxiety   ? Arthritis   ? AVM (arteriovenous malformation) of colon   ? CAD (coronary artery disease)   ? a. s/p CABG in 2013 b. DES to D1 in 10/2016. c. cath in 07/2018 showing patent grafts with occlusion of D1 at prior stent site and progression of PDA disease --> medical management recommended  ? Carotid artery disease ([place])   ? a. 62-70% LICA, 09/5007   ? Chronic anemia   ? Chronic bronchitis ([place])   ? Chronic diastolic CHF (congestive heart failure) ([place])   ? a. 02/2012 Echo EF 60-65%, nl wall motion, Gr 1 DD, mod MR  ? Colon cancer ([place]) 1992  ? Esophageal stricture   ?  ESRD on hemodialysis ([place])   ? ESRD due to HTN, started dialysis 2011 and gets HD at [facility] with Dr Hinda Lenis on MWF schedule.  Access is LUA AVF as of Sept 2014.   ? GERD (gastroesophageal reflux disease)   ? High cholesterol 12/2011  ? History of blood transfusion 07/2011; 12/2011; 01/2012 X 2; 04/2012  ? History of gout   ? History of lower GI bleeding   ? Hypertension   ? Iron deficiency anemia   ? Jugular vein occlusion, right ([place])   ? Mitral regurgitation   ? a. Moderate by echo, 02/2012  ? Myocardial infarction [facility])   ? NSVT (nonsustained ventricular tachycardia) ([place])   ? Ovarian cancer ([place]) 1992  ? PAF (paroxysmal atrial fibrillation) ([place])   ? Pneumonia ~ 2009  ? PUD (peptic ulcer disease)   ? TIA (transient ischemic attack)   ? ? ?Past Surgical History:  ?Procedure Laterality Date  ? A/V SHUNTOGRAM Left 03/19/2019  ? Procedure: A/V SHUNTOGRAM;  Surgeon: Katha Cabal, MD;  Location: [place] [facility];  Service: Cardiovascular;  Laterality: Left;  ? ABDOMINAL HYSTERECTOMY  1992  ? APPENDECTOMY  06/1990  ? AV FISTULA PLACEMENT  07/2009  ? left upper arm  ? AV FISTULA PLACEMENT Right 09/06/2016  ? Procedure: RIGHT FOREARM ARTERIOVENOUS (AV) GRAFT;  Surgeon: Elam Dutch, MD;  Location: [place];  Service: Vascular;  Laterality: Right;  ?  AV FISTULA PLACEMENT N/A 02/24/2017  ? Procedure: INSERTION OF ARTERIOVENOUS (AV) GORE-TEX GRAFT ARM (BRACHIAL AXILLARY);  Surgeon: Katha Cabal, MD;  Location: [facility];  Service: Vascular;  Laterality: N/A;  ? [place] REMOVAL Right 09/06/2016  ? Procedure: REMOVAL OF Right Arm ARTERIOVENOUS GORETEX GRAFT and Vein Patch angioplasty of brachial artery;  Surgeon: Angelia Mould, MD;  Location: [place];  Service: Vascular;  Laterality: Right;  ? BIOPSY  09/26/2019  ? Procedure: BIOPSY;  Surgeon: Rogene Houston, MD;  [facility];  Service: Endoscopy;;  ? COLON RESECTION  1992  ? COLON SURGERY    ? COLONOSCOPY N/A 03/09/2019  ? Procedure: COLONOSCOPY;   Surgeon: Rogene Houston, MD;  [facility];  Service: Endoscopy;  Laterality: N/A;  ? COLONOSCOPY WITH PROPOFOL N/A 06/08/2021  ? Procedure: COLONOSCOPY WITH PROPOFOL;  Surgeon: Harvel Quale, MD;  [facility];  Service: Gastroenterology;  Laterality: N/A;  9:05 /Patient is on dialysis Mon Wed Fri  ? CORONARY ANGIOPLASTY WITH STENT PLACEMENT  12/15/11  ? "2"  ? CORONARY ANGIOPLASTY WITH STENT PLACEMENT  y/2013  ? "1; makes total of 3" (05/02/2012)  ? CORONARY ARTERY BYPASS GRAFT  06/13/2012  ? Procedure: CORONARY ARTERY BYPASS GRAFTING (CABG);  Surgeon: Grace Isaac, MD;  Location: [place];  Service: Open Heart Surgery;  Laterality: N/A;  cabg x four;  using left internal mammary artery, and left leg greater saphenous vein harvested endoscopically  ? CORONARY STENT INTERVENTION N/A 10/13/2016  ? Procedure: Coronary Stent Intervention;  Surgeon: Troy Sine, MD;  Location: [place] [facility];  Service: Cardiovascular;  Laterality: N/A;  ? DIALYSIS/PERMA CATHETER REMOVAL N/A 04/18/2017  ? Procedure: DIALYSIS/PERMA CATHETER REMOVAL;  Surgeon: Katha Cabal, MD;  Location: [place] [facility];  Service: Cardiovascular;  Laterality: N/A;  ? DILATION AND CURETTAGE OF UTERUS    ? ENTEROSCOPY N/A 06/08/2021  ? Procedure: PUSH ENTEROSCOPY;  Surgeon: Harvel Quale, MD;  [facility];  Service: Gastroenterology;  Laterality: N/A;  ? ESOPHAGOGASTRODUODENOSCOPY  01/20/2012  ? Procedure: ESOPHAGOGASTRODUODENOSCOPY (EGD);  Surgeon: Ladene Artist, MD,FACG;  Location: [facility];  Service: Endoscopy;  Laterality: N/A;  ? ESOPHAGOGASTRODUODENOSCOPY N/A 03/26/2013  ? Procedure: ESOPHAGOGASTRODUODENOSCOPY (EGD);  Surgeon: Irene Shipper, MD;  Location: [facility] ENDOSCOPY;  Service: Endoscopy;  Laterality: N/A;  ? ESOPHAGOGASTRODUODENOSCOPY N/A 04/30/2015  ? Procedure: ESOPHAGOGASTRODUODENOSCOPY (EGD);  Surgeon: Rogene Houston, MD;  [facility];  Service:  Endoscopy;  Laterality: N/A;  1pm - moved to 10/20 @ 1:10  ? ESOPHAGOGASTRODUODENOSCOPY N/A 07/29/2016  ? Procedure: ESOPHAGOGASTRODUODENOSCOPY (EGD);  Surgeon: Manus Gunning, MD;  Location: [place];  Service: Gastroenterology;  Laterality: N/A;  enteroscopy  ? ESOPHAGOGASTRODUODENOSCOPY N/A 09/26/2019  ? Procedure: ESOPHAGOGASTRODUODENOSCOPY (EGD);  Surgeon: Rogene Houston, MD;  [facility];  Service: Endoscopy;  Laterality: N/A;  1250  ? ESOPHAGOGASTRODUODENOSCOPY (EGD) WITH PROPOFOL N/A 02/05/2021  ? Procedure: ESOPHAGOGASTRODUODENOSCOPY (EGD) WITH PROPOFOL;  Surgeon: Eloise Harman, DO;  [facility];  Service: Endoscopy;  Laterality: N/A;  ? GIVENS CAPSULE STUDY N/A 03/07/2019  ? Procedure: GIVENS CAPSULE STUDY;  Surgeon: Rogene Houston, MD;  [facility];  Service: Endoscopy;  Laterality: N/A;  7:30  ? GIVENS CAPSULE STUDY N/A 04/22/2021  ? Procedure: GIVENS CAPSULE STUDY;  Surgeon: Rogene Houston, MD;  [facility];  Service: Endoscopy;  Laterality: N/A;  7:30  ? INSERTION OF DIALYSIS CATHETER N/A  10/05/2020  ? Procedure: ABORTED TUNNELED DIALYSIS CATHETER PLACEMENT RIGHT INTERNAL JUGULAR VEIN ;  Surgeon: Virl Cagey, MD;  [facility];  Service: General;  Laterality: N/A;  ? INTRAOPERATIVE TRANSESOPHAGEAL ECHOCARDIOGRAM  06/13/2012  ? Procedure: INTRAOPERATIVE TRANSESOPHAGEAL ECHOCARDIOGRAM;  Surgeon: Grace Isaac, MD;  Location: [place];  Service: Open Heart Surgery;  Laterality: N/A;  ? IR DIALY SHUNT INTRO NEEDLE/INTRACATH INITIAL W/IMG LEFT Left 10/06/2020  ? IR FLUORO GUIDE CV LINE RIGHT  06/17/2020  ? IR GENERIC HISTORICAL  07/26/2016  ? IR FLUORO GUIDE CV LINE RIGHT 07/26/2016 Greggory Keen, MD [facility]  ? IR GENERIC HISTORICAL  07/26/2016  ? IR US GUIDE VASC ACCESS RIGHT 07/26/2016 Greggory Keen, MD [facility]  ? IR GENERIC HISTORICAL  08/02/2016  ? IR US GUIDE VASC ACCESS RIGHT 08/02/2016 Greggory Keen, MD [facility]  ?  IR GENERIC HISTORICAL  08/02/2016  ? IR FLUORO GUIDE CV LINE RIGHT 08/02/2016 Greggory Keen, MD [facility]  ? IR RADIOLOGY PERIPHERAL GUIDED IV START  03/28/2017  ? IR REMOVAL TUN CV CATH W/O FL  08/11/2020  ? IR TH

## 2021-11-08 ENCOUNTER — Emergency Department (HOSPITAL_COMMUNITY): Payer: Medicare HMO

## 2021-11-08 ENCOUNTER — Inpatient Hospital Stay (HOSPITAL_COMMUNITY)
Admission: EM | Admit: 2021-11-08 | Discharge: 2021-11-24 | DRG: 917 | Disposition: A | Discharge status: Skilled Nursing Facility | Payer: Medicare HMO | Attending: Internal Medicine | Admitting: Internal Medicine

## 2021-11-08 ENCOUNTER — Telehealth (INDEPENDENT_AMBULATORY_CARE_PROVIDER_SITE_OTHER): Payer: Self-pay

## 2021-11-08 ENCOUNTER — Other Ambulatory Visit: Payer: Self-pay

## 2021-11-08 ENCOUNTER — Encounter (HOSPITAL_COMMUNITY): Payer: Self-pay

## 2021-11-08 DIAGNOSIS — T361X1A Poisoning by cephalosporins and other beta-lactam antibiotics, accidental (unintentional), initial encounter: Secondary | ICD-10-CM | POA: Diagnosis not present

## 2021-11-08 DIAGNOSIS — Y92009 Unspecified place in unspecified non-institutional (private) residence as the place of occurrence of the external cause: Secondary | ICD-10-CM

## 2021-11-08 DIAGNOSIS — I447 Left bundle-branch block, unspecified: Secondary | ICD-10-CM | POA: Diagnosis present

## 2021-11-08 DIAGNOSIS — E7800 Pure hypercholesterolemia, unspecified: Secondary | ICD-10-CM | POA: Diagnosis present

## 2021-11-08 DIAGNOSIS — Z8249 Family history of ischemic heart disease and other diseases of the circulatory system: Secondary | ICD-10-CM

## 2021-11-08 DIAGNOSIS — Z79890 Hormone replacement therapy (postmenopausal): Secondary | ICD-10-CM

## 2021-11-08 DIAGNOSIS — Z91158 Patient's noncompliance with renal dialysis for other reason: Secondary | ICD-10-CM

## 2021-11-08 DIAGNOSIS — I480 Paroxysmal atrial fibrillation: Secondary | ICD-10-CM | POA: Diagnosis present

## 2021-11-08 DIAGNOSIS — T426X5A Adverse effect of other antiepileptic and sedative-hypnotic drugs, initial encounter: Secondary | ICD-10-CM | POA: Diagnosis present

## 2021-11-08 DIAGNOSIS — B965 Pseudomonas (aeruginosa) (mallei) (pseudomallei) as the cause of diseases classified elsewhere: Secondary | ICD-10-CM | POA: Diagnosis present

## 2021-11-08 DIAGNOSIS — Z79899 Other long term (current) drug therapy: Secondary | ICD-10-CM

## 2021-11-08 DIAGNOSIS — Z85038 Personal history of other malignant neoplasm of large intestine: Secondary | ICD-10-CM | POA: Diagnosis present

## 2021-11-08 DIAGNOSIS — H7092 Unspecified mastoiditis, left ear: Secondary | ICD-10-CM | POA: Diagnosis present

## 2021-11-08 DIAGNOSIS — I340 Nonrheumatic mitral (valve) insufficiency: Secondary | ICD-10-CM | POA: Diagnosis present

## 2021-11-08 DIAGNOSIS — I5042 Chronic combined systolic (congestive) and diastolic (congestive) heart failure: Secondary | ICD-10-CM | POA: Diagnosis present

## 2021-11-08 DIAGNOSIS — Z6823 Body mass index (BMI) 23.0-23.9, adult: Secondary | ICD-10-CM

## 2021-11-08 DIAGNOSIS — Z880 Allergy status to penicillin: Secondary | ICD-10-CM

## 2021-11-08 DIAGNOSIS — Z833 Family history of diabetes mellitus: Secondary | ICD-10-CM

## 2021-11-08 DIAGNOSIS — E876 Hypokalemia: Secondary | ICD-10-CM | POA: Diagnosis not present

## 2021-11-08 DIAGNOSIS — Z955 Presence of coronary angioplasty implant and graft: Secondary | ICD-10-CM

## 2021-11-08 DIAGNOSIS — H6092 Unspecified otitis externa, left ear: Secondary | ICD-10-CM

## 2021-11-08 DIAGNOSIS — Z992 Dependence on renal dialysis: Secondary | ICD-10-CM | POA: Diagnosis not present

## 2021-11-08 DIAGNOSIS — R4182 Altered mental status, unspecified: Principal | ICD-10-CM

## 2021-11-08 DIAGNOSIS — N186 End stage renal disease: Secondary | ICD-10-CM | POA: Diagnosis not present

## 2021-11-08 DIAGNOSIS — R54 Age-related physical debility: Secondary | ICD-10-CM | POA: Diagnosis present

## 2021-11-08 DIAGNOSIS — I252 Old myocardial infarction: Secondary | ICD-10-CM

## 2021-11-08 DIAGNOSIS — F05 Delirium due to known physiological condition: Secondary | ICD-10-CM | POA: Diagnosis not present

## 2021-11-08 DIAGNOSIS — G9341 Metabolic encephalopathy: Secondary | ICD-10-CM | POA: Diagnosis present

## 2021-11-08 DIAGNOSIS — E440 Moderate protein-calorie malnutrition: Secondary | ICD-10-CM | POA: Diagnosis present

## 2021-11-08 DIAGNOSIS — Z8673 Personal history of transient ischemic attack (TIA), and cerebral infarction without residual deficits: Secondary | ICD-10-CM

## 2021-11-08 DIAGNOSIS — H6022 Malignant otitis externa, left ear: Secondary | ICD-10-CM | POA: Diagnosis present

## 2021-11-08 DIAGNOSIS — I4892 Unspecified atrial flutter: Secondary | ICD-10-CM | POA: Diagnosis present

## 2021-11-08 DIAGNOSIS — I5022 Chronic systolic (congestive) heart failure: Secondary | ICD-10-CM

## 2021-11-08 DIAGNOSIS — M898X9 Other specified disorders of bone, unspecified site: Secondary | ICD-10-CM | POA: Diagnosis present

## 2021-11-08 DIAGNOSIS — Z888 Allergy status to other drugs, medicaments and biological substances status: Secondary | ICD-10-CM

## 2021-11-08 DIAGNOSIS — T40605A Adverse effect of unspecified narcotics, initial encounter: Secondary | ICD-10-CM | POA: Diagnosis present

## 2021-11-08 DIAGNOSIS — Z90710 Acquired absence of both cervix and uterus: Secondary | ICD-10-CM

## 2021-11-08 DIAGNOSIS — E538 Deficiency of other specified B group vitamins: Secondary | ICD-10-CM | POA: Diagnosis present

## 2021-11-08 DIAGNOSIS — E861 Hypovolemia: Secondary | ICD-10-CM | POA: Diagnosis not present

## 2021-11-08 DIAGNOSIS — Z800 Family history of malignant neoplasm of digestive organs: Secondary | ICD-10-CM

## 2021-11-08 DIAGNOSIS — Z885 Allergy status to narcotic agent status: Secondary | ICD-10-CM

## 2021-11-08 DIAGNOSIS — Z83438 Family history of other disorder of lipoprotein metabolism and other lipidemia: Secondary | ICD-10-CM

## 2021-11-08 DIAGNOSIS — Z951 Presence of aortocoronary bypass graft: Secondary | ICD-10-CM

## 2021-11-08 DIAGNOSIS — Z781 Physical restraint status: Secondary | ICD-10-CM

## 2021-11-08 DIAGNOSIS — T424X5A Adverse effect of benzodiazepines, initial encounter: Secondary | ICD-10-CM | POA: Diagnosis present

## 2021-11-08 DIAGNOSIS — Z8711 Personal history of peptic ulcer disease: Secondary | ICD-10-CM

## 2021-11-08 DIAGNOSIS — I132 Hypertensive heart and chronic kidney disease with heart failure and with stage 5 chronic kidney disease, or end stage renal disease: Secondary | ICD-10-CM | POA: Diagnosis present

## 2021-11-08 DIAGNOSIS — Z8543 Personal history of malignant neoplasm of ovary: Secondary | ICD-10-CM

## 2021-11-08 DIAGNOSIS — G9340 Encephalopathy, unspecified: Secondary | ICD-10-CM | POA: Diagnosis not present

## 2021-11-08 DIAGNOSIS — I10 Essential (primary) hypertension: Secondary | ICD-10-CM | POA: Diagnosis present

## 2021-11-08 DIAGNOSIS — K219 Gastro-esophageal reflux disease without esophagitis: Secondary | ICD-10-CM | POA: Diagnosis present

## 2021-11-08 DIAGNOSIS — Z91041 Radiographic dye allergy status: Secondary | ICD-10-CM

## 2021-11-08 DIAGNOSIS — D631 Anemia in chronic kidney disease: Secondary | ICD-10-CM | POA: Diagnosis present

## 2021-11-08 DIAGNOSIS — I2510 Atherosclerotic heart disease of native coronary artery without angina pectoris: Secondary | ICD-10-CM | POA: Diagnosis present

## 2021-11-08 DIAGNOSIS — E531 Pyridoxine deficiency: Secondary | ICD-10-CM | POA: Diagnosis present

## 2021-11-08 DIAGNOSIS — Z7982 Long term (current) use of aspirin: Secondary | ICD-10-CM

## 2021-11-08 DIAGNOSIS — I471 Supraventricular tachycardia: Secondary | ICD-10-CM | POA: Diagnosis not present

## 2021-11-08 DIAGNOSIS — E560 Deficiency of vitamin E: Secondary | ICD-10-CM | POA: Diagnosis present

## 2021-11-08 DIAGNOSIS — E1122 Type 2 diabetes mellitus with diabetic chronic kidney disease: Secondary | ICD-10-CM | POA: Diagnosis present

## 2021-11-08 DIAGNOSIS — Z886 Allergy status to analgesic agent status: Secondary | ICD-10-CM

## 2021-11-08 LAB — CBC
HCT: 25.1 % — ABNORMAL LOW (ref 36.0–46.0)
HCT: 22.9 % — ABNORMAL LOW (ref 36.0–46.0)
Hemoglobin: 7.8 g/dL — ABNORMAL LOW (ref 12.0–15.0)
Hemoglobin: 7.2 g/dL — ABNORMAL LOW (ref 12.0–15.0)
MCH: 34.4 pg — ABNORMAL HIGH (ref 26.0–34.0)
MCH: 34.6 pg — ABNORMAL HIGH (ref 26.0–34.0)
MCHC: 31.1 g/dL (ref 30.0–36.0)
MCHC: 31.4 g/dL (ref 30.0–36.0)
MCV: 110.6 fL — ABNORMAL HIGH (ref 80.0–100.0)
MCV: 110.1 fL — ABNORMAL HIGH (ref 80.0–100.0)
Platelets: 128 10*3/uL — ABNORMAL LOW (ref 150–400)
Platelets: 130 10*3/uL — ABNORMAL LOW (ref 150–400)
RBC: 2.27 MIL/uL — ABNORMAL LOW (ref 3.87–5.11)
RBC: 2.08 MIL/uL — ABNORMAL LOW (ref 3.87–5.11)
RDW: 20.4 % — ABNORMAL HIGH (ref 11.5–15.5)
RDW: 20.4 % — ABNORMAL HIGH (ref 11.5–15.5)
WBC: 13.8 10*3/uL — ABNORMAL HIGH (ref 4.0–10.5)
WBC: 12.3 10*3/uL — ABNORMAL HIGH (ref 4.0–10.5)
nRBC: 0 % (ref 0.0–0.2)
nRBC: 0 % (ref 0.0–0.2)

## 2021-11-08 LAB — BASIC METABOLIC PANEL
Anion gap: 15 (ref 5–15)
BUN: 52 mg/dL — ABNORMAL HIGH (ref 8–23)
CO2: 20 mmol/L — ABNORMAL LOW (ref 22–32)
Calcium: 9.1 mg/dL (ref 8.9–10.3)
Chloride: 98 mmol/L (ref 98–111)
Creatinine, Ser: 9.72 mg/dL — ABNORMAL HIGH (ref 0.44–1.00)
GFR, Estimated: 4 mL/min — ABNORMAL LOW (ref >60)
Glucose, Bld: 129 mg/dL — ABNORMAL HIGH (ref 70–99)
Potassium: 4.3 mmol/L (ref 3.5–5.1)
Sodium: 133 mmol/L — ABNORMAL LOW (ref 135–145)

## 2021-11-08 LAB — I-STAT VENOUS BLOOD GAS, ED
Acid-base deficit: 4 mmol/L — ABNORMAL HIGH (ref 0.0–2.0)
Bicarbonate: 21.6 mmol/L (ref 20.0–28.0)
Calcium, Ion: 1.17 mmol/L (ref 1.15–1.40)
HCT: 22 % — ABNORMAL LOW (ref 36.0–46.0)
Hemoglobin: 7.5 g/dL — ABNORMAL LOW (ref 12.0–15.0)
O2 Saturation: 89 %
Potassium: 3.9 mmol/L (ref 3.5–5.1)
Sodium: 132 mmol/L — ABNORMAL LOW (ref 135–145)
TCO2: 23 mmol/L (ref 22–32)
pCO2, Ven: 39 mmHg — ABNORMAL LOW (ref 44–60)
pH, Ven: 7.352 (ref 7.25–7.43)
pO2, Ven: 60 mmHg — ABNORMAL HIGH (ref 32–45)

## 2021-11-08 LAB — FOLATE: Folate: >40 ng/mL (ref >5.9)

## 2021-11-08 LAB — BRAIN NATRIURETIC PEPTIDE: B Natriuretic Peptide: 1391.8 pg/mL — ABNORMAL HIGH (ref 0.0–100.0)

## 2021-11-08 LAB — CREATININE, SERUM
Creatinine, Ser: 10.33 mg/dL — ABNORMAL HIGH (ref 0.44–1.00)
GFR, Estimated: 3 mL/min — ABNORMAL LOW (ref >60)

## 2021-11-08 LAB — PROTIME-INR
INR: 1.3 — ABNORMAL HIGH (ref 0.8–1.2)
Prothrombin Time: 16 seconds — ABNORMAL HIGH (ref 11.4–15.2)

## 2021-11-08 LAB — AMMONIA: Ammonia: 33 umol/L (ref 9–35)

## 2021-11-08 LAB — MAGNESIUM: Magnesium: 1.9 mg/dL (ref 1.7–2.4)

## 2021-11-08 LAB — VITAMIN B12: Vitamin B-12: 319 pg/mL (ref 180–914)

## 2021-11-08 LAB — CBG MONITORING, ED: Glucose-Capillary: 103 mg/dL — ABNORMAL HIGH (ref 70–99)

## 2021-11-08 IMAGING — DX DG CHEST 1V PORT
1 series · 1 of 1 positions shown · non-contrast
Comparison: [DATE]

CLINICAL DATA: Weakness, altered level of consciousness

EXAM:
PORTABLE CHEST 1 VIEW

[chest]
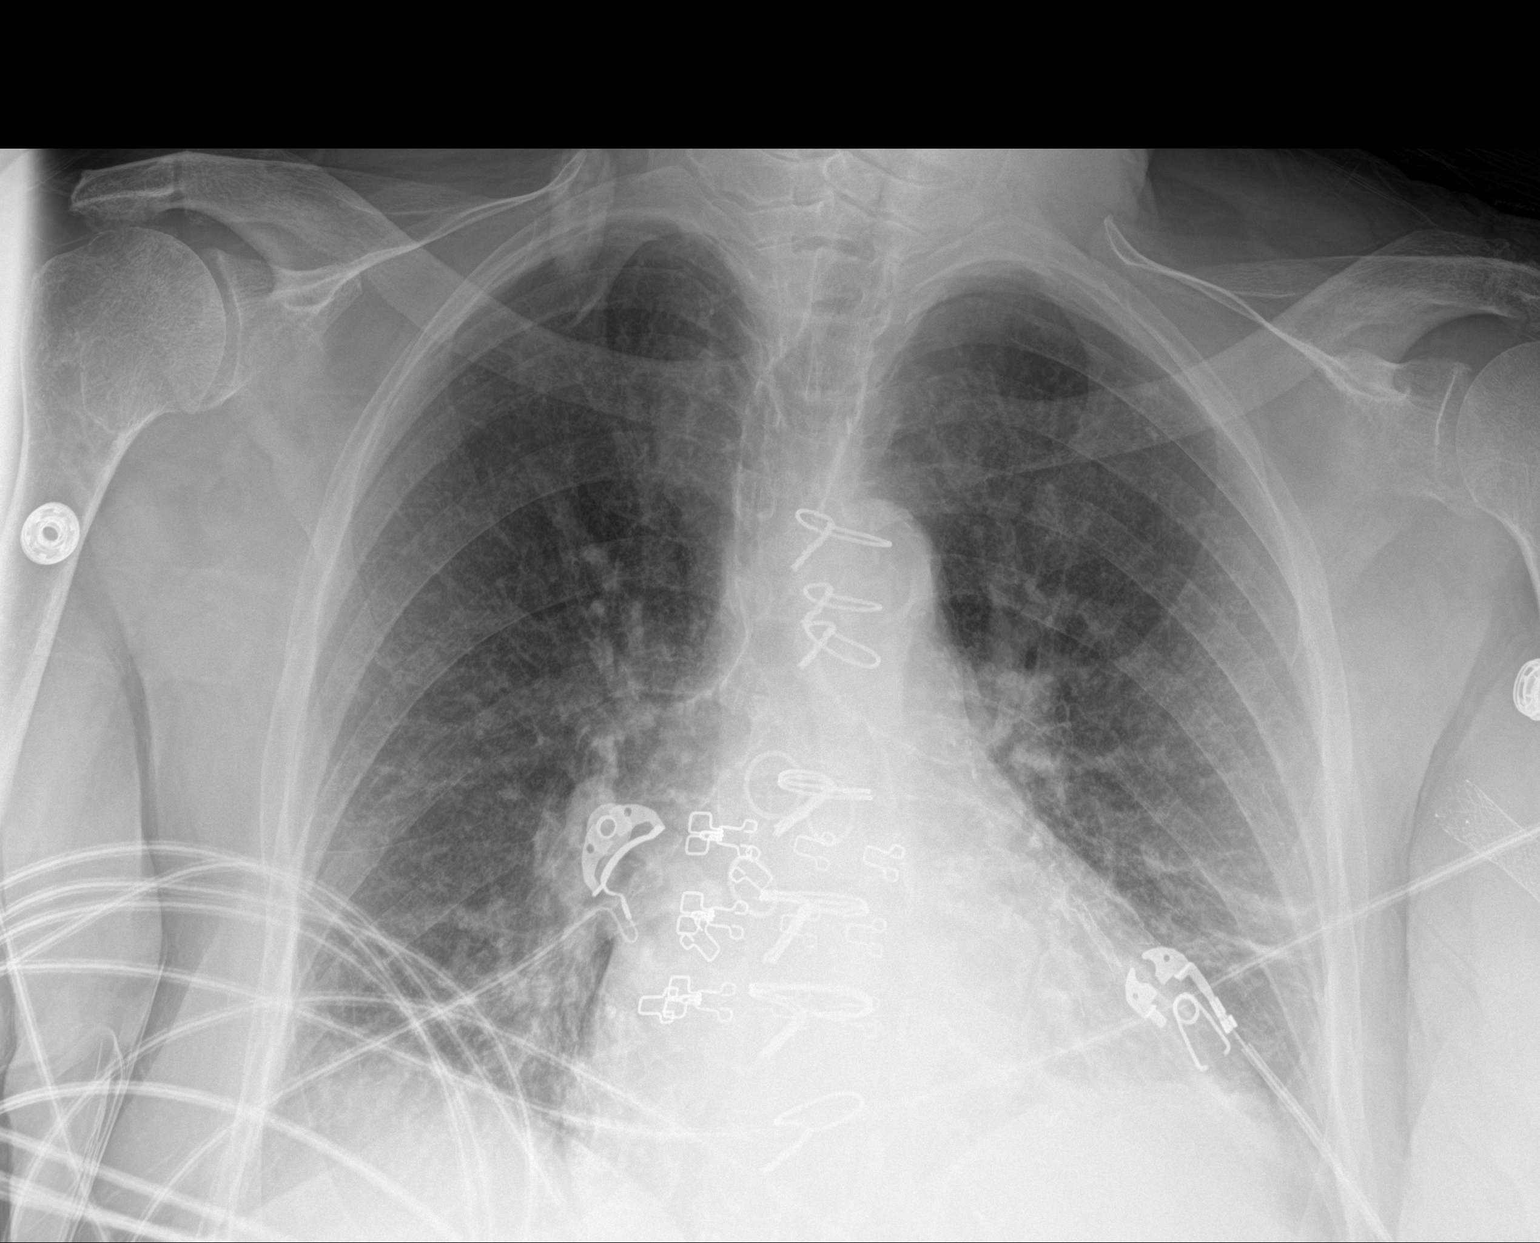

[1 of 1 positions shown; findings below may reference images not displayed]

FINDINGS: Single frontal view of the chest demonstrates a stable cardiac
silhouette. Postsurgical changes from CABG. There is increased
central vascular congestion, without focal consolidation, effusion,
or pneumothorax. Chronic scarring at the left lung base. No acute
bony abnormality.
IMPRESSION: 1. Increased central vascular congestion without overt edema.

## 2021-11-08 IMAGING — CT CT HEAD W/O CM
3 of 4 series · 13 of 47 positions shown, 15 images · non-contrast
Comparison: Multiple exams, including [DATE]

CLINICAL DATA: Acute neurologic deficit

EXAM:
CT HEAD WITHOUT CONTRAST
TECHNIQUE: Contiguous axial images were obtained from the base of the skull
through the vertex without intravenous contrast.
RADIATION DOSE REDUCTION: This exam was performed according to the
departmental dose-optimization program which includes automated
exposure control, adjustment of the mA and/or kV according to
patient size and/or use of iterative reconstruction technique.

[Series 2: head without · axial · non-contrast · 0.39mm/px · z∈[-94,+11]mm · 7 of 29 slices shown, 9 images]
[im 4/29  brain]
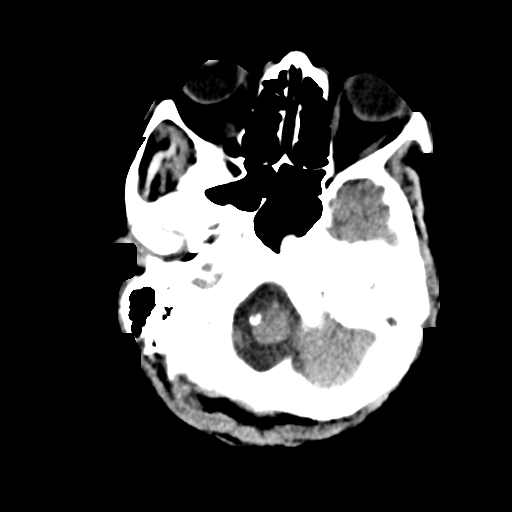
[im 4/29  bone]
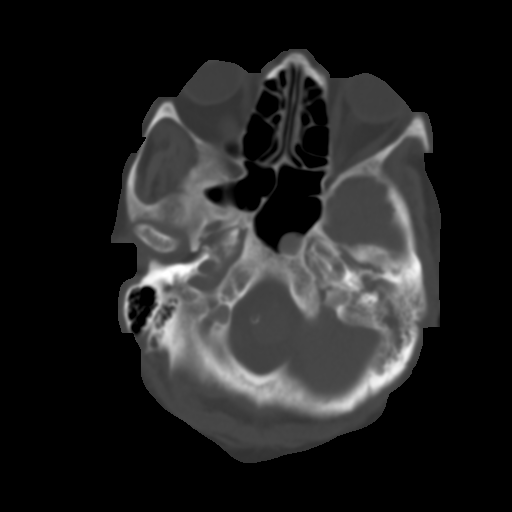
[im 8/29  brain]
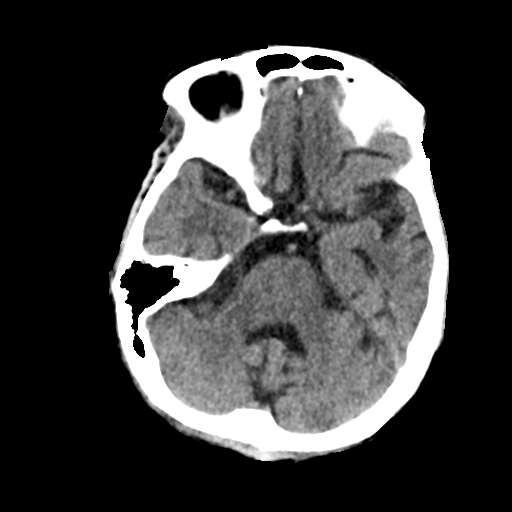
[im 11/29  brain]
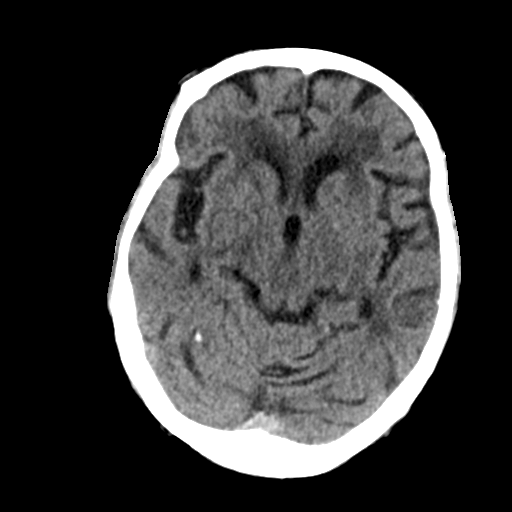
[im 15/29  brain]
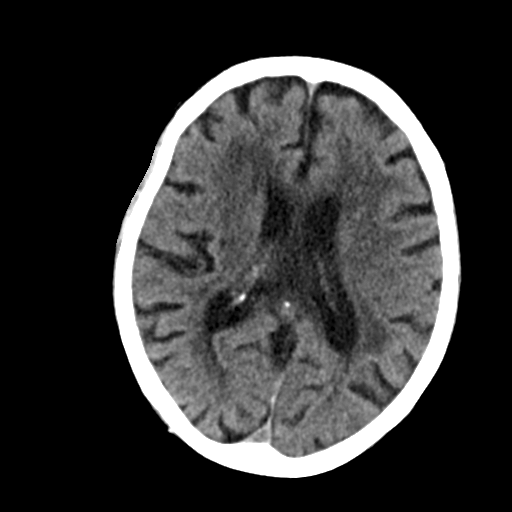
[im 18/29  brain]
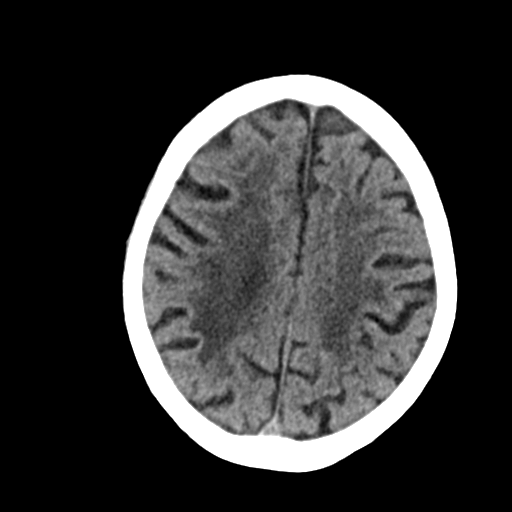
[im 18/29  bone]
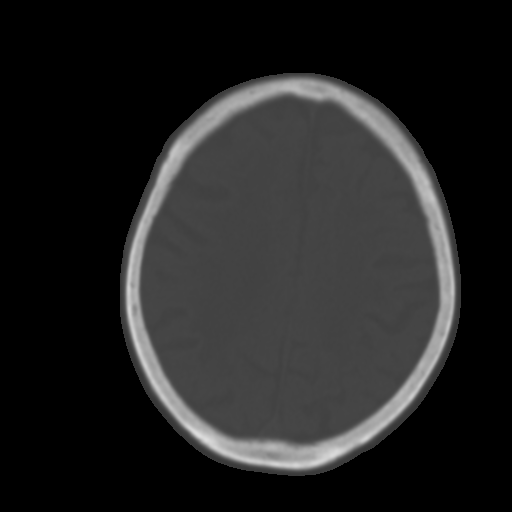
[im 22/29  brain]
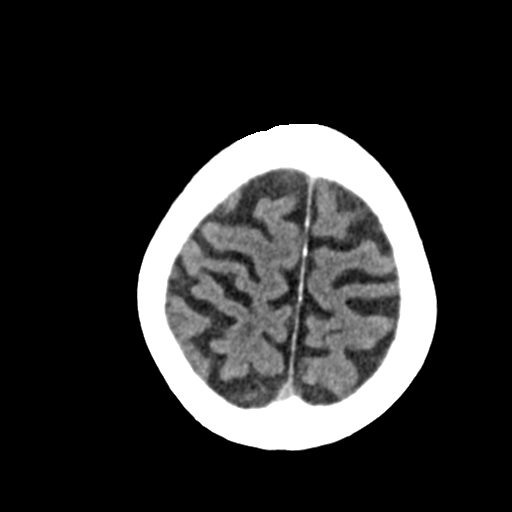
[im 25/29  brain]
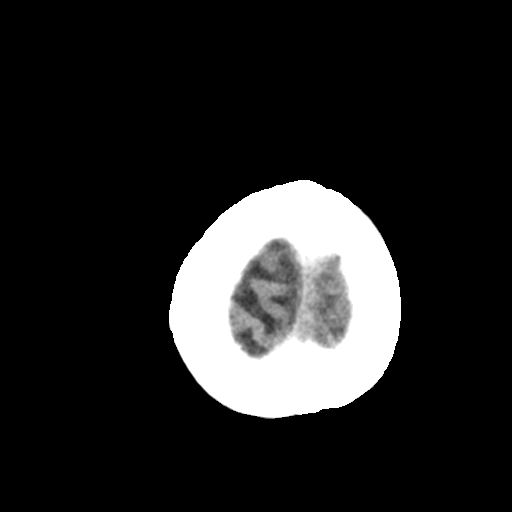

[Series 4: head without cor · coronal · non-contrast · 0.28mm/px · 3 of 67 slices shown]
[im 23/67  brain]
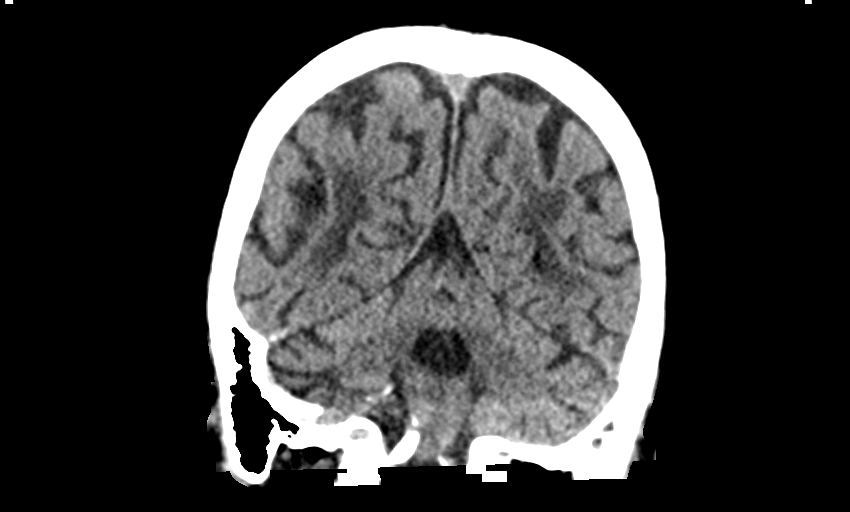
[im 30/67  brain]
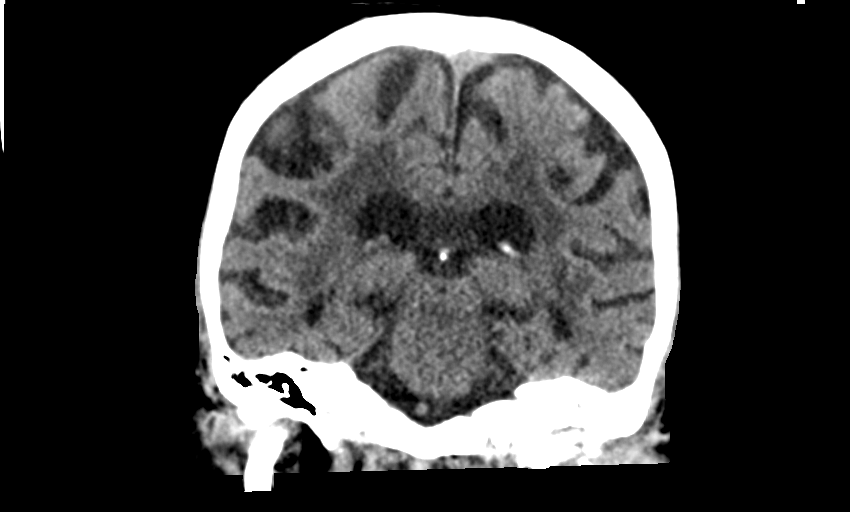
[im 37/67  brain]
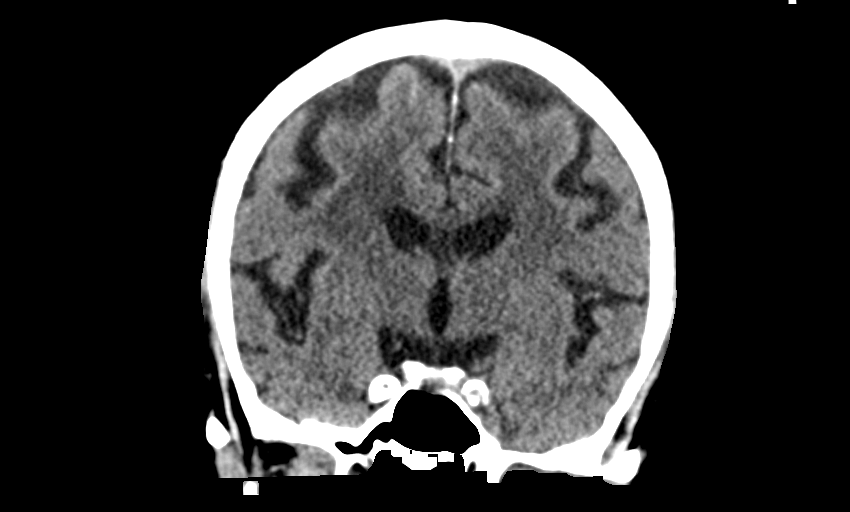

[Series 5: head without sag · sagittal · non-contrast · 0.28mm/px · 3 of 67 slices shown]
[im 23/67  brain]
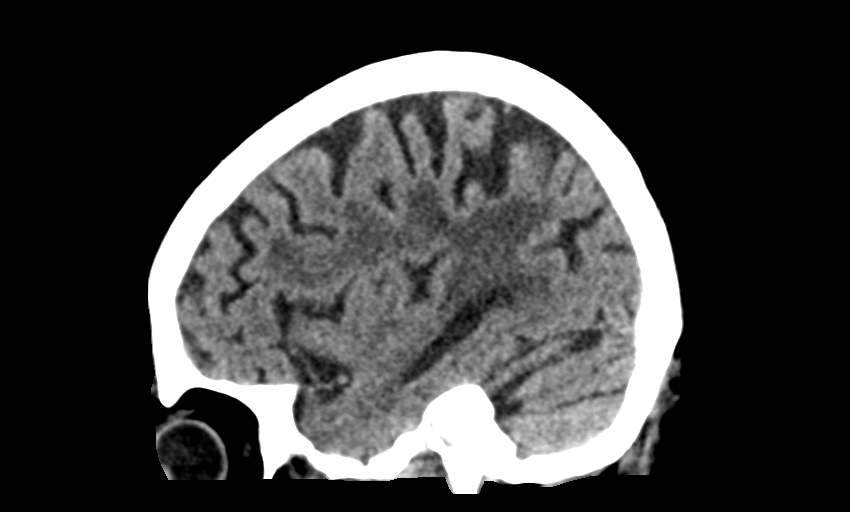
[im 34/67  brain]
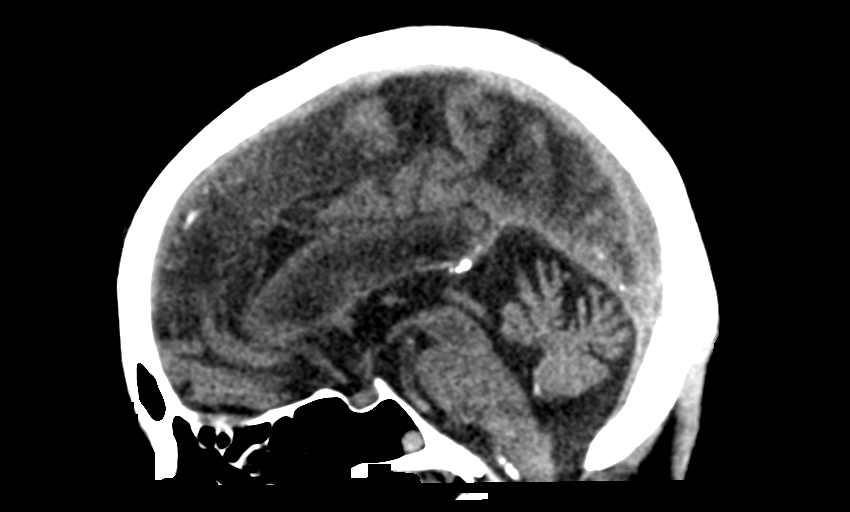
[im 45/67  brain]
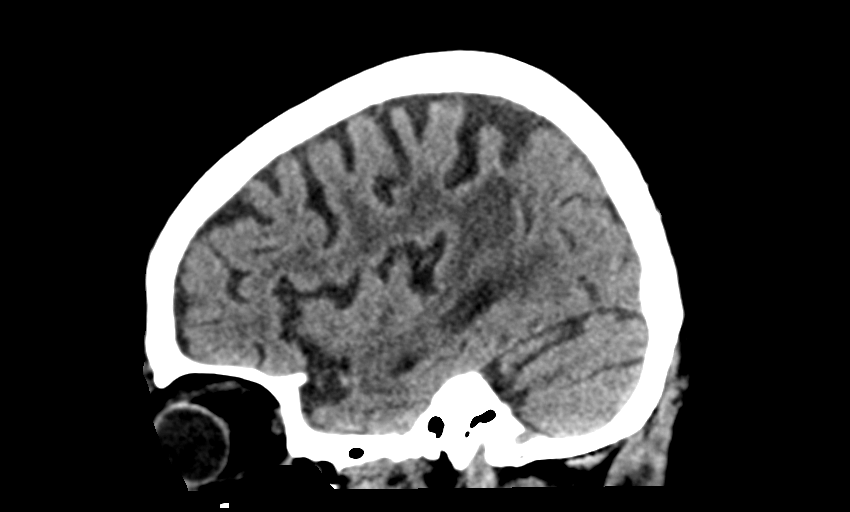

[13 of 47 positions shown; findings below may reference images not displayed]

FINDINGS: Brain: Periventricular white matter and corona radiata hypodensities
favor chronic ischemic microvascular white matter disease.
Otherwise, the brainstem, cerebellum, cerebral peduncles, thalamus,
basal ganglia, basilar cisterns, and ventricular system appear
within normal limits. No intracranial hemorrhage, mass lesion, or
acute CVA.

Vascular: There is atherosclerotic calcification of the cavernous
carotid arteries bilaterally.

Skull: Unremarkable

Sinuses/Orbits: Continued complete left mastoid effusion with fluid
density filling the middle ear and the external auditory canal on
the left. The appearance is compatible with otitis media and
entities such as otitis externa and mastoiditis are not excluded.
Osseous erosion along the posterior osseous portion of the external
auditory canal is again suggested on image 8 series 3. Chronic left
sphenoid sinusitis.

Other: No supplemental non-categorized findings.
IMPRESSION: 1. As on [DATE], there is a large left mastoid effusion
associated with complete opacification of the middle ear and
complete occlusion of the external auditory canal on the left,
compatible with otitis media and possible mastoiditis/otitis
externa.
2. Small mucous retention cyst in the left sphenoid sinus, stable.
3. Periventricular white matter and corona radiata hypodensities
favor chronic ischemic microvascular white matter disease.
4. Atherosclerosis

## 2021-11-08 MED ORDER — HEPARIN SODIUM (PORCINE) 1000 UNIT/ML DIALYSIS
1000.0000 [IU] | INTRAMUSCULAR | Status: DC | PRN
  Filled 2021-11-08 (×2): qty 1

## 2021-11-08 MED ORDER — ALTEPLASE 2 MG IJ SOLR: 2.0000 mg | Freq: Once | INTRAMUSCULAR | Status: DC | PRN

## 2021-11-08 MED ORDER — SODIUM CHLORIDE 0.9 % IV SOLN
2.0000 g | INTRAVENOUS | Status: DC
  Administered 2021-11-09: 2 g via INTRAVENOUS
  Filled 2021-11-08: qty 20

## 2021-11-08 MED ORDER — PENTAFLUOROPROP-TETRAFLUOROETH EX AERO
1.0000 "application " | INHALATION_SPRAY | CUTANEOUS | Status: DC | PRN
  Filled 2021-11-08: qty 103.5

## 2021-11-08 MED ORDER — AMIODARONE HCL 200 MG PO TABS
100.0000 mg | ORAL_TABLET | Freq: Every day | ORAL | Status: DC
  Administered 2021-11-09 – 2021-11-11 (×3): 100 mg via ORAL
  Filled 2021-11-08: qty 0.5
  Filled 2021-11-08 (×3): qty 1

## 2021-11-08 MED ORDER — SEVELAMER CARBONATE 800 MG PO TABS
2400.0000 mg | ORAL_TABLET | Freq: Three times a day (TID) | ORAL | Status: DC
  Administered 2021-11-09 – 2021-11-15 (×7): 2400 mg via ORAL
  Filled 2021-11-08 (×12): qty 3

## 2021-11-08 MED ORDER — LIDOCAINE-PRILOCAINE 2.5-2.5 % EX CREA: 1.0000 "application " | TOPICAL_CREAM | CUTANEOUS | Status: DC | PRN

## 2021-11-08 MED ORDER — SODIUM CHLORIDE 0.9 % IV SOLN: 100.0000 mL | INTRAVENOUS | Status: DC | PRN

## 2021-11-08 MED ORDER — LIDOCAINE HCL (PF) 1 % IJ SOLN
5.0000 mL | INTRAMUSCULAR | Status: DC | PRN
  Filled 2021-11-08: qty 5

## 2021-11-08 MED ORDER — RENA-VITE PO TABS
1.0000 | ORAL_TABLET | Freq: Every day | ORAL | Status: DC
  Administered 2021-11-09 – 2021-11-11 (×3): 1 via ORAL
  Filled 2021-11-08 (×5): qty 1

## 2021-11-08 MED ORDER — SODIUM CHLORIDE 0.9 % IV SOLN
2.0000 g | Freq: Once | INTRAVENOUS | Status: AC
  Administered 2021-11-08: 2 g via INTRAVENOUS
  Filled 2021-11-08: qty 20

## 2021-11-08 MED ORDER — LEVOTHYROXINE SODIUM 25 MCG PO TABS
25.0000 ug | ORAL_TABLET | Freq: Every day | ORAL | Status: DC
  Administered 2021-11-09 – 2021-11-16 (×5): 25 ug via ORAL
  Filled 2021-11-08 (×7): qty 1

## 2021-11-08 MED ORDER — CHLORHEXIDINE GLUCONATE CLOTH 2 % EX PADS
6.0000 | MEDICATED_PAD | Freq: Every day | CUTANEOUS | Status: DC
  Administered 2021-11-09 – 2021-11-24 (×14): 6 via TOPICAL

## 2021-11-08 MED ORDER — HEPARIN SODIUM (PORCINE) 5000 UNIT/ML IJ SOLN
5000.0000 [IU] | Freq: Three times a day (TID) | INTRAMUSCULAR | Status: DC
  Administered 2021-11-08: 5000 [IU] via SUBCUTANEOUS
  Filled 2021-11-08: qty 1

## 2021-11-08 MED ORDER — MIDODRINE HCL 5 MG PO TABS
5.0000 mg | ORAL_TABLET | ORAL | Status: DC
  Administered 2021-11-10: 10 mg via ORAL
  Administered 2021-11-15: 5 mg via ORAL
  Filled 2021-11-08: qty 2
  Filled 2021-11-08: qty 1

## 2021-11-08 MED ORDER — ROSUVASTATIN CALCIUM 5 MG PO TABS
10.0000 mg | ORAL_TABLET | Freq: Every day | ORAL | Status: DC
  Administered 2021-11-09 – 2021-11-11 (×3): 10 mg via ORAL
  Filled 2021-11-08 (×4): qty 2

## 2021-11-08 NOTE — ED Notes (Addendum)
Phlebotomy unable to obtain second set of blood cultures. Admitting MD aware. ?

## 2021-11-08 NOTE — H&P (Addendum)
History and Physical    Shawna [name]:[id number] DOB: [birth year] DOA: 11/08/2021  PCP: [facility]  Patient coming from: Home.  Chief Complaint: Confusion and weakness.  History obtained from patient husband.  HPI: Shawna [name] is a 81 y.o. female with history of paroxysmal atrial fibrillation not on anticoagulation secondary to history of GI bleed, CAD status post CABG and stent placement, ESRD on hemodialysis on Monday Wednesday Friday prior history of colon cancer, hyperlipidemia was brought to the ER after patient was found to be increasingly confused and weak over the last 24 hours.  Because of the weakness patient missed her dialysis today.  As per the husband patient did not have any fever chills nausea vomiting chest pain or shortness of breath.  About 10 days ago patient did come to the ER for left earache and was found to have external otitis media and mastoiditis was treated with oral antibiotics.  Per husband patient is not complaining of any headache or ear pain at this time.  ED Course: In the ER patient's temperature was around 99 F CT head shows features concerning for mastoiditis and otitis media.  Blood cultures obtained started on antibiotics.  Patient appears confused but does not have any neck rigidity or signs of meningitis.  MRI brain is pending.  On-call nephrology has been consulted for urgent dialysis.  Patient otherwise is able to move all extremities oriented to her name.  Review of Systems: As per HPI, rest all negative.   Past Medical History:  Diagnosis Date   Acute on chronic respiratory failure with hypoxia (HCC) 10/10/2016   Anxiety    Arthritis    AVM (arteriovenous malformation) of colon    CAD (coronary artery disease)    a. s/p CABG in 2013 b. DES to D1 in 10/2016. c. cath in 07/2018 showing patent grafts with occlusion of D1 at prior stent site and progression of PDA disease --> medical management recommended   Carotid artery  disease (HCC)    a. 60-79% LICA, 03/2012    Chronic anemia    Chronic bronchitis (HCC)    Chronic diastolic CHF (congestive heart failure) (HCC)    a. 02/2012 Echo EF 60-65%, nl wall motion, Gr 1 DD, mod MR   Colon cancer (HCC) 1992   Esophageal stricture    ESRD on hemodialysis (HCC)    ESRD due to HTN, started dialysis 2011 and gets HD at [facility] with Dr Fausto Skillern on MWF schedule.  Access is LUA AVF as of Sept 2014.    GERD (gastroesophageal reflux disease)    High cholesterol 12/2011   History of blood transfusion 07/2011; 12/2011; 01/2012 X 2; 04/2012   History of gout    History of lower GI bleeding    Hypertension    Iron deficiency anemia    Jugular vein occlusion, right (HCC)    Mitral regurgitation    a. Moderate by echo, 02/2012   Myocardial infarction [facility]    NSVT (nonsustained ventricular tachycardia) (HCC)    Ovarian cancer (HCC) 1992   PAF (paroxysmal atrial fibrillation) (HCC)    Pneumonia ~ 2009   PUD (peptic ulcer disease)    TIA (transient ischemic attack)     Past Surgical History:  Procedure Laterality Date   A/V SHUNTOGRAM Left 03/19/2019   Procedure: A/V SHUNTOGRAM;  Surgeon: Renford Dills, MD;  Location: [facility] INVASIVE CV LAB;  Service: Cardiovascular;  Laterality: Left;   ABDOMINAL HYSTERECTOMY  1992   APPENDECTOMY  06/1990   AV FISTULA PLACEMENT  07/2009   left upper arm   AV FISTULA PLACEMENT Right 09/06/2016   Procedure: RIGHT FOREARM ARTERIOVENOUS (AV) GRAFT;  Surgeon: Sherren Kerns, MD;  Location[facility] OR;  Service: Vascular;  Laterality: Right;   AV FISTULA PLACEMENT N/A 02/24/2017   Procedure: INSERTION OF ARTERIOVENOUS (AV) GORE-TEX GRAFT ARM (BRACHIAL AXILLARY);  Surgeon: Renford Dills, MD;  Location: [facility] ORS;  Service: Vascular;  Laterality: N/A;   AVGG REMOVAL Right 09/06/2016   Procedure: REMOVAL OF Right Arm ARTERIOVENOUS GORETEX GRAFT and Vein Patch angioplasty of brachial artery;  Surgeon: Chuck Hint, MD;  Location: [facility] OR;   Service: Vascular;  Laterality: Right;   BIOPSY  09/26/2019   Procedure: BIOPSY;  Surgeon: Malissa Hippo, MD;  [facility];  Service: Endoscopy;;   COLON RESECTION  1992   COLON SURGERY     COLONOSCOPY N/A 03/09/2019   Procedure: COLONOSCOPY;  Surgeon: Malissa Hippo, MD;  [facility];  Service: Endoscopy;  Laterality: N/A;   COLONOSCOPY WITH PROPOFOL N/A 06/08/2021   Procedure: COLONOSCOPY WITH PROPOFOL;  Surgeon: Dolores Frame, MD;  [facility];  Service: Gastroenterology;  Laterality: N/A;  9:05 /Patient is on dialysis Mon Wed Fri   CORONARY ANGIOPLASTY WITH STENT PLACEMENT  12/15/11   "2"   CORONARY ANGIOPLASTY WITH STENT PLACEMENT  y/2013   "1; makes total of 3" (05/02/2012)   CORONARY ARTERY BYPASS GRAFT  06/13/2012   Procedure: CORONARY ARTERY BYPASS GRAFTING (CABG);  Surgeon: Delight Ovens, MD;  Location: [facility] OR;  Service: Open Heart Surgery;  Laterality: N/A;  cabg x four;  using left internal mammary artery, and left leg greater saphenous vein harvested endoscopically   CORONARY STENT INTERVENTION N/A 10/13/2016   Procedure: Coronary Stent Intervention;  Surgeon: Lennette Bihari, MD;  [facility]: [facility] INVASIVE CV LAB;  Service: Cardiovascular;  Laterality: N/A;   DIALYSIS/PERMA CATHETER REMOVAL N/A 04/18/2017   Procedure: DIALYSIS/PERMA CATHETER REMOVAL;  Surgeon: Renford Dills, MD;  Location: [facility] INVASIVE CV LAB;  Service: Cardiovascular;  Laterality: N/A;   DILATION AND CURETTAGE OF UTERUS     ENTEROSCOPY N/A 06/08/2021   Procedure: PUSH ENTEROSCOPY;  Surgeon: Dolores Frame, MD;  [facility];  Service: Gastroenterology;  Laterality: N/A;   ESOPHAGOGASTRODUODENOSCOPY  01/20/2012   Procedure: ESOPHAGOGASTRODUODENOSCOPY (EGD);  Surgeon: Meryl Dare, MD,FACG;  Location: [facility] ENDOSCOPY;  Service: Endoscopy;  Laterality: N/A;   ESOPHAGOGASTRODUODENOSCOPY N/A 03/26/2013   Procedure: ESOPHAGOGASTRODUODENOSCOPY  (EGD);  Surgeon: Hilarie Fredrickson, MD;  Location: [facility] ENDOSCOPY;  Service: Endoscopy;  Laterality: N/A;   ESOPHAGOGASTRODUODENOSCOPY N/A 04/30/2015   Procedure: ESOPHAGOGASTRODUODENOSCOPY (EGD);  Surgeon: Malissa Hippo, MD;  [facility];  Service: Endoscopy;  Laterality: N/A;  1pm - moved to 10/20 @ 1:10   ESOPHAGOGASTRODUODENOSCOPY N/A 07/29/2016   Procedure: ESOPHAGOGASTRODUODENOSCOPY (EGD);  Surgeon: Ruffin Frederick, MD;  Location: [facility] ENDOSCOPY;  Service: Gastroenterology;  Laterality: N/A;  enteroscopy   ESOPHAGOGASTRODUODENOSCOPY N/A 09/26/2019   Procedure: ESOPHAGOGASTRODUODENOSCOPY (EGD);  Surgeon: Malissa Hippo, MD;  [facility];  Service: Endoscopy;  Laterality: N/A;  1250   ESOPHAGOGASTRODUODENOSCOPY (EGD) WITH PROPOFOL N/A 02/05/2021   Procedure: ESOPHAGOGASTRODUODENOSCOPY (EGD) WITH PROPOFOL;  Surgeon: Lanelle Bal, DO;  [facility];  Service: Endoscopy;  Laterality: N/A;   GIVENS CAPSULE STUDY N/A 03/07/2019   Procedure: GIVENS CAPSULE STUDY;  Surgeon: Malissa Hippo, MD;  [facility];  Service: Endoscopy;  Laterality: N/A;  7:30   GIVENS CAPSULE STUDY N/A 04/22/2021   Procedure: GIVENS CAPSULE STUDY;  Surgeon: Malissa Hippo, MD;  [facility];  Service: Endoscopy;  Laterality: N/A;  7:30   INSERTION OF DIALYSIS CATHETER N/A 10/05/2020   Procedure: ABORTED TUNNELED DIALYSIS CATHETER PLACEMENT RIGHT INTERNAL JUGULAR VEIN ;  Surgeon: Lucretia Roers, MD;  Location: [facility]S;  Service: General;  Laterality: N/A;   INTRAOPERATIVE TRANSESOPHAGEAL ECHOCARDIOGRAM  06/13/2012   Procedure: INTRAOPERATIVE TRANSESOPHAGEAL ECHOCARDIOGRAM;  Surgeon: Delight Ovens, MD;  Location: [facility] OR;  Service: Open Heart Surgery;  Laterality: N/A;   IR DIALY SHUNT INTRO NEEDLE/INTRACATH INITIAL W/IMG LEFT Left 10/06/2020   IR FLUORO GUIDE CV LINE RIGHT  06/17/2020   IR GENERIC HISTORICAL  07/26/2016   IR FLUORO GUIDE CV LINE RIGHT 07/26/2016  Berdine Dance, MD [facility]   IR GENERIC HISTORICAL  07/26/2016   IR US GUIDE VASC ACCESS RIGHT 07/26/2016 Berdine Dance, MD [facility]   IR GENERIC HISTORICAL  08/02/2016   IR US GUIDE VASC ACCESS RIGHT 08/02/2016 Berdine Dance, MD [facility]   IR GENERIC HISTORICAL  08/02/2016   IR FLUORO GUIDE CV LINE RIGHT 08/02/2016 Berdine Dance, MD [facility]   IR RADIOLOGY PERIPHERAL GUIDED IV START  03/28/2017   IR REMOVAL TUN CV CATH W/O FL  08/11/2020   IR THROMBECTOMY AV FISTULA W/THROMBOLYSIS INC/SHUNT/IMG LEFT Left 06/17/2020   IR US GUIDE VASC ACCESS LEFT  06/17/2020   IR US GUIDE VASC ACCESS RIGHT  03/28/2017   IR US GUIDE VASC ACCESS RIGHT  06/17/2020   LEFT HEART CATH AND CORONARY ANGIOGRAPHY N/A 09/20/2016   Procedure: Left Heart Cath and Coronary Angiography;  Surgeon: Lyn Records, MD;  Location: [facility] INVASIVE CV LAB;  Service: Cardiovascular;  Laterality: N/A;   LEFT HEART CATH AND CORS/GRAFTS ANGIOGRAPHY N/A 10/13/2016   Procedure: Left Heart Cath and Cors/Grafts Angiography;  Surgeon: Lennette Bihari, MD;  [facility];  Service: Cardiovascular;  Laterality: N/A;   LEFT HEART CATH AND CORS/GRAFTS ANGIOGRAPHY N/A 07/13/2018   Procedure: LEFT HEART CATH AND CORS/GRAFTS ANGIOGRAPHY;  Surgeon: Swaziland, Peter M, MD;  Location: [facility] INVASIVE CV LAB;  Service: Cardiovascular;  Laterality: N/A;   LEFT HEART CATH AND CORS/GRAFTS ANGIOGRAPHY N/A 07/22/2021   Procedure: LEFT HEART CATH AND CORS/GRAFTS ANGIOGRAPHY;  Surgeon: Runell Gess, MD;  [facility]: [facility] LAB;  Service: Cardiovascular;  Laterality: N/A;   LEFT HEART CATHETERIZATION WITH CORONARY ANGIOGRAM N/A 12/15/2011   Procedure: LEFT HEART CATHETERIZATION WITH CORONARY ANGIOGRAM;  Surgeon: Kathleene Hazel, MD;  Location: [facility] CATH LAB;  Service: Cardiovascular;  Laterality: N/A;   LEFT HEART CATHETERIZATION WITH CORONARY ANGIOGRAM N/A 01/10/2012   Procedure: LEFT HEART CATHETERIZATION WITH CORONARY ANGIOGRAM;  Surgeon:  Peter M Swaziland, MD;  Location: [facility] CATH LAB;  Service: Cardiovascular;  Laterality: N/A;   LEFT HEART CATHETERIZATION WITH CORONARY ANGIOGRAM N/A 06/08/2012   Procedure: LEFT HEART CATHETERIZATION WITH CORONARY ANGIOGRAM;  Surgeon: Kathleene Hazel, MD;  Location: [facility] CATH LAB;  Service: Cardiovascular;  Laterality: N/A;   LEFT HEART CATHETERIZATION WITH CORONARY/GRAFT ANGIOGRAM N/A 12/10/2013   Procedure: LEFT HEART CATHETERIZATION WITH Isabel Caprice;  Surgeon: Corky Crafts, MD;  Location: [facility] CATH LAB;  Service: Cardiovascular;  Laterality: N/A;   OVARY SURGERY     ovarian cancer   POLYPECTOMY  03/09/2019   Procedure: POLYPECTOMY;  Surgeon: Malissa Hippo, MD;  [facility];  Service: Endoscopy;;  cecal    POLYPECTOMY N/A 09/26/2019   Procedure: DUODENAL POLYPECTOMY;  Surgeon: Malissa Hippo, MD;  [facility];  Service: Endoscopy;  Laterality: N/A;   REVISION OF ARTERIOVENOUS GORETEX GRAFT N/A 02/24/2017   Procedure: REVISION OF ARTERIOVENOUS GORETEX GRAFT (RESECTION);  Surgeon: Renford Dills, MD;  Location: [facility] ORS;  Service: Vascular;  Laterality: N/A;   REVISON OF ARTERIOVENOUS FISTULA Left 06/19/2020   Procedure: REVISION OF LEFT UPPER ARM AV GRAFT WITH INTERPOSITION JUMP GRAFT USING GORE LIMB;  Surgeon: Cephus Shelling, MD;  Location: [facility] OR;  Service: Vascular;  Laterality: Left;   SHUNTOGRAM N/A 10/15/2013   Procedure: Fistulogram;  Surgeon: Nada Libman, MD;  Location: [facility] CATH LAB;  Service: Cardiovascular;  Laterality: N/A;   THROMBECTOMY / ARTERIOVENOUS GRAFT REVISION  2011   left upper arm   TUBAL LIGATION  1980's   UPPER EXTREMITY ANGIOGRAPHY Bilateral 12/06/2016   Procedure: Upper Extremity Angiography;  Surgeon: Renford Dills, MD;  Location: [facility] INVASIVE CV LAB;  Service: Cardiovascular;  Laterality: Bilateral;   UPPER EXTREMITY INTERVENTION Left 06/06/2017   Procedure: UPPER EXTREMITY INTERVENTION;  Surgeon:  Renford Dills, MD;  Location: [facility] INVASIVE CV LAB;  Service: Cardiovascular;  Laterality: Left;     reports that she has never smoked. She has never used smokeless tobacco. She reports that she does not drink alcohol and does not use drugs.  Allergies  Allergen Reactions   Amlodipine Swelling   Aspirin Other (See Comments)    High Doses Mess up her stomach; "makes my bowels have blood in them". Takes 81 mg EC Aspirin    Nitrofurantoin Hives   Penicillins Other (See Comments)    SYNCOPE? , "makes me real weak when I take it; like I'll pass out"  Has patient had a PCN reaction causing immediate rash, facial/tongue/throat swelling, SOB or lightheadedness with hypotension: Yes Has patient had a PCN reaction causing severe rash involving mucus membranes or skin necrosis: no Has patient had a PCN reaction that required hospitalization no Has patient had a PCN reaction occurring within the last 10 years: no If all of the above    Bactrim [Sulfamethoxazole-Trimethoprim] Rash   Contrast Media [Iodinated Contrast Media] Itching   Iron Itching and Other (See Comments)    "they gave me iron in dialysis; had to give me Benadryl cause I had to have the iron" (05/02/2012)   Tylenol [Acetaminophen] Itching and Other (See Comments)    Makes her feet on fire per pt   Gabapentin Other (See Comments)    Unknown reaction   Iron Sucrose    Ranexa [Ranolazine] Other (See Comments)    Myoclonus-hospitalized    Sucroferric Oxyhydroxide    Dexilant [Dexlansoprazole] Other (See Comments)    Upset stomach   Hydralazine Itching    Has tolerated while inpatient Has tolerated while inpatient   Levaquin [Levofloxacin In D5w] Rash   Morphine And Related Itching and Other (See Comments)    Itching in feet   Pantoprazole Rash   Plavix [Clopidogrel Bisulfate] Rash   Protonix [Pantoprazole Sodium] Rash   Venofer [Ferric Oxide] Itching and Other (See Comments)    Patient reports using Benadryl prior to  doses as [facility]    Family History  Problem Relation Age of Onset   Heart disease Mother        Heart Disease before age 36   Hyperlipidemia Mother    Hypertension Mother    Diabetes  Mother    Heart attack Mother    Heart disease Father        Heart Disease before age 52   Hyperlipidemia Father    Hypertension Father    Diabetes Father    Diabetes Sister    Hypertension Sister    Diabetes Brother    Hyperlipidemia Brother    Heart attack Brother    Hypertension Sister    Heart attack Brother    Colon cancer Child 67   Other Other        noncontributory for early CAD   Esophageal cancer Neg Hx    Liver disease Neg Hx    Kidney disease Neg Hx    Colon polyps Neg Hx     Prior to Admission medications   Medication Sig Start Date End Date Taking? Authorizing Provider  ALPRAZolam Prudy Feeler) 0.5 MG tablet Take 0.5 mg by mouth at bedtime. 03/20/21   [provider]  amiodarone (PACERONE) 100 MG tablet Take 1 tablet (100 mg total) by mouth daily. 08/20/21   Shon Hale, MD  aspirin EC 81 MG tablet Take 1 tablet (81 mg total) by mouth daily with breakfast. Patient taking differently: Take 81-325 mg by mouth daily as needed for mild pain. 08/20/21   Shon Hale, MD  cefdinir (OMNICEF) 300 MG capsule Take 300 mg by mouth daily. 10/26/21   [provider]  ciprofloxacin-dexamethasone (CIPRODEX) OTIC suspension 3 drops 3 (three) times daily. 11/02/21   [provider]  gabapentin (NEURONTIN) 100 MG capsule Take 100 mg by mouth 2 (two) times daily. 10/06/21   [provider]  hydrOXYzine (VISTARIL) 100 MG capsule Take 100 mg by mouth daily as needed for anxiety. 11/23/20   [provider]  levothyroxine (SYNTHROID) 25 MCG tablet Take 1 tablet (25 mcg total) by mouth daily. 02/02/21   Dani Gobble, NP  lidocaine-prilocaine (EMLA) cream Apply 1 application topically every Monday, Wednesday, and Friday. Prior to dialysis    [provider]  loperamide (IMODIUM A-D) 2 MG tablet Take 2 tablets (4 mg total) by mouth daily as needed for diarrhea or loose stools. Patient taking differently: Take 1-2 tablets by mouth daily as needed for diarrhea or loose stools. 12/17/20   Leroy Sea, MD  midodrine (PROAMATINE) 5 MG tablet Take 1-2 tablets (5-10 mg total) by mouth every Monday, Wednesday, and Friday with hemodialysis. 10/20/21   Rhetta Mura, MD  multivitamin (RENA-VIT) TABS tablet Take 1 tablet by mouth daily.    [provider]  neomycin-polymyxin-hydrocortisone (CORTISPORIN) 3.5-10000-1 OTIC suspension Place 3 drops into the left ear every 6 (six) hours. 10/20/21   Rhetta Mura, MD  nitroGLYCERIN (NITROSTAT) 0.4 MG SL tablet Place 1 tablet (0.4 mg total) under the tongue every 5 (five) minutes x 3 doses as needed for chest pain (if no relief after 2nd dose, proceed to the ED for an evaluation or call 911). 02/19/21   Netta Neat., NP  omeprazole (PRILOSEC) 20 MG capsule Take 1 capsule (20 mg total) by mouth daily. Patient taking differently: Take 40 mg by mouth daily as needed (acid reflux). 12/01/20   Rehman, Joline Maxcy, MD  oxyCODONE (ROXICODONE) 5 MG immediate release tablet Take 1 tablet (5 mg total) by mouth every 4 (four) hours as needed for severe pain. 10/29/21   Charlynne Pander, MD  rosuvastatin (CRESTOR) 10 MG tablet Take 1 tablet (10 mg total) by mouth daily. 05/13/21   Ellsworth Lennox, PA-C  sevelamer  carbonate (RENVELA) 800 MG tablet Take 3 tablets (2,400 mg total) by mouth 3 (three) times daily with meals. 08/20/21   Shon Hale, MD    Physical Exam: Constitutional: Moderately built and nourished. Vitals:   11/08/21 2100 11/08/21 2115 11/08/21 2130 11/08/21 2145  BP: (!) 170/51 (!) 164/51 (!) 159/45 (!) 161/44  Pulse: 72 72 72 71  Resp: 19 17 (!) 24 (!) 24  Temp:      SpO2: 100% 99% 97% 99%   Eyes: Anicteric no pallor. ENMT: No discharge from the ears eyes nose  and mouth. Neck: No mass felt.  No neck rigidity. Respiratory: No rhonchi or crepitations. Cardiovascular: S1-S2 heard. Abdomen: Soft nontender bowel sound present. Musculoskeletal: No edema. Skin: No rash. Neurologic: Alert awake oriented to name moving all extremities pupils equal reacting to light. Psychiatric: Oriented to name.   Labs on Admission: I have personally reviewed following labs and imaging studies  CBC: Recent Labs  Lab 11/08/21 1800  WBC 13.8*  HGB 7.8*  HCT 25.1*  MCV 110.6*  PLT 128*   Basic Metabolic Panel: Recent Labs  Lab 11/08/21 1800  NA 133*  K 4.3  CL 98  CO2 20*  GLUCOSE 129*  BUN 52*  CREATININE 9.72*  CALCIUM 9.1  MG 1.9   GFR: Estimated Creatinine Clearance: 3.4 mL/min (A) (by C-G formula based on SCr of 9.72 mg/dL (H)). Liver Function Tests: No results for input(s): AST, ALT, ALKPHOS, BILITOT, PROT, ALBUMIN in the last 168 hours. No results for input(s): LIPASE, AMYLASE in the last 168 hours. Recent Labs  Lab 11/08/21 1800  AMMONIA 33   Coagulation Profile: Recent Labs  Lab 11/08/21 1800  INR 1.3*   Cardiac Enzymes: No results for input(s): CKTOTAL, CKMB, CKMBINDEX, TROPONINI in the last 168 hours. BNP (last 3 results) No results for input(s): PROBNP in the last 8760 hours. HbA1C: No results for input(s): HGBA1C in the last 72 hours. CBG: Recent Labs  Lab 11/08/21 1657  GLUCAP 103*   Lipid Profile: No results for input(s): CHOL, HDL, LDLCALC, TRIG, CHOLHDL, LDLDIRECT in the last 72 hours. Thyroid Function Tests: No results for input(s): TSH, T4TOTAL, FREET4, T3FREE, THYROIDAB in the last 72 hours. Anemia Panel: Recent Labs    11/08/21 1800  VITAMINB12 319  FOLATE >40.0   Urine analysis:    Component Value Date/Time   COLORURINE YELLOW 12/16/2012 1919   APPEARANCEUR CLOUDY (A) 12/16/2012 1919   LABSPEC 1.009 12/16/2012 1919   PHURINE 7.5 12/16/2012 1919   GLUCOSEU NEGATIVE 12/16/2012 1919   HGBUR TRACE  (A) 12/16/2012 1919   BILIRUBINUR NEGATIVE 12/16/2012 1919   KETONESUR NEGATIVE 12/16/2012 1919   PROTEINUR 100 (A) 12/16/2012 1919   UROBILINOGEN 0.2 12/16/2012 1919   NITRITE NEGATIVE 12/16/2012 1919   LEUKOCYTESUR SMALL (A) 12/16/2012 1919   Sepsis Labs: @LABRCNTIP (procalcitonin:4,lacticidven:4) )No results found for this or any previous visit (from the past 240 hour(s)).   Radiological Exams on Admission: CT Head Wo Contrast  Result Date: 11/08/2021 CLINICAL DATA:  Acute neurologic deficit EXAM: CT HEAD WITHOUT CONTRAST TECHNIQUE: Contiguous axial images were obtained from the base of the skull through the vertex without intravenous contrast. RADIATION DOSE REDUCTION: This exam was performed according to the departmental dose-optimization program which includes automated exposure control, adjustment of the mA and/or kV according to patient size and/or use of iterative reconstruction technique. COMPARISON:  Multiple exams, including 08/30/2021 FINDINGS: Brain: Periventricular white matter and corona radiata hypodensities favor chronic ischemic microvascular white matter disease. Otherwise, the  brainstem, cerebellum, cerebral peduncles, thalamus, basal ganglia, basilar cisterns, and ventricular system appear within normal limits. No intracranial hemorrhage, mass lesion, or acute CVA. Vascular: There is atherosclerotic calcification of the cavernous carotid arteries bilaterally. Skull: Unremarkable Sinuses/Orbits: Continued complete left mastoid effusion with fluid density filling the middle ear and the external auditory canal on the left. The appearance is compatible with otitis media and entities such as otitis externa and mastoiditis are not excluded. Osseous erosion along the posterior osseous portion of the external auditory canal is again suggested on image 8 series 3. Chronic left sphenoid sinusitis. Other: No supplemental non-categorized findings. IMPRESSION: 1. As on 10/29/2021, there is a  large left mastoid effusion associated with complete opacification of the middle ear and complete occlusion of the external auditory canal on the left, compatible with otitis media and possible mastoiditis/otitis externa. 2. Small mucous retention cyst in the left sphenoid sinus, stable. 3. Periventricular white matter and corona radiata hypodensities favor chronic ischemic microvascular white matter disease. 4. Atherosclerosis Electronically Signed   By: Gaylyn Rong M.D.   On: 11/08/2021 15:08   DG Chest Portable 1 View  Result Date: 11/08/2021 CLINICAL DATA:  Weakness, altered level of consciousness EXAM: PORTABLE CHEST 1 VIEW COMPARISON:  10/29/2021 FINDINGS: Single frontal view of the chest demonstrates a stable cardiac silhouette. Postsurgical changes from CABG. There is increased central vascular congestion, without focal consolidation, effusion, or pneumothorax. Chronic scarring at the left lung base. No acute bony abnormality. IMPRESSION: 1. Increased central vascular congestion without overt edema. Electronically Signed   By: Sharlet Salina M.D.   On: 11/08/2021 19:00    EKG: Independently reviewed.  Normal sinus rhythm.  Assessment/Plan Principal Problem:   Acute encephalopathy Active Problems:   Hx of CABG   Essential hypertension   History of colon cancer   History of ovarian cancer   ESRD (end stage renal disease) on dialysis (HCC)   PAF (paroxysmal atrial fibrillation) (HCC)    Acute encephalopathy and generalized weakness cause not clear.  Patient's CAT scan does show mastoiditis and otitis media.  No definite signs of any meningitis or encephalitis.  MRI brain is pending.  Did collect blood cultures and starting antibiotics for the mastoiditis/otitis media.  Not sure if uremia is causing the symptoms.  Holding patient's gabapentin, Xanax, oxycodone for now. Mastoiditis and possible otitis media was recently treated with oral antibiotics after ER patient discussed with Dr.  Suszanne Conners ENT surgeon.  We will consult ENT surgeon for further recommendation.  Continue antibiotics. ESRD on hemodialysis nephrology has been consulted for dialysis.  Patient did miss her dialysis today.  Does not look fluid overloaded. History of paroxysmal atrial fibrillation not on anticoagulation secondary to GI bleed on amiodarone.  Continue amiodarone. History of CAD status post CABG denies any chest pain.  Takes statins. Anemia likely from ESRD follow CBC. Prior history of colon cancer and ovarian cancer.   MRI brain is pending.   DVT prophylaxis: SCDs for now.  Avoiding anticoagulation in case patient needs procedure for the mastoiditis. Code Status: Full code. Family Communication: Patient husband at the bedside. Disposition Plan: Home when stable. Consults called: Nephrology.  Will need to consult ENT. Admission status: Observation.   Eduard Clos MD [facility] Pager [phone].  If 7PM-7AM, please contact night-coverage [email or web address] Password [name]  11/08/2021, 9:52 PM

## 2021-11-08 NOTE — ED Provider Notes (Signed)
[facility] EMERGENCY DEPARTMENT Provider Note   CSN: [id number] Arrival date & time: 11/08/21  1323     History  Chief Complaint  Patient presents with   Insomnia    Shawna [name] is a 81 y.o. female.   Insomnia   Patient is 81 year old female with multiple medical history including HFrEF, ESRD on dialysis on Monday, Wednesday, Friday who presents to the emergency department due to concern for altered mental status and weakness.  Per her husband they skip dialysis today due to patient weakness.  He reports she does get confused sometimes however this is different.  She usually is able to walk however for the past 2 days has not been walking well.  Last dialysis was on Friday.  Denies any recent history of fever, vomiting, or diarrhea.  Reports compliance with medication.  In addition, they are in the process of getting a fistula revision this month due to stenosis of her current fistula.  Per husband patient had a recent left ear infection.  He reports she is on some antibiotic but does not know the name.  He also reports she has been using eardrops.  Patient does not make urine. Otherwise no other complaints.  Home Medications Prior to Admission medications   Medication Sig Start Date End Date Taking? Authorizing Provider  ALPRAZolam Prudy Feeler) 0.5 MG tablet Take 0.5 mg by mouth at bedtime. 03/20/21   [provider]  amiodarone (PACERONE) 100 MG tablet Take 1 tablet (100 mg total) by mouth daily. 08/20/21   Shon Hale, MD  aspirin EC 81 MG tablet Take 1 tablet (81 mg total) by mouth daily with breakfast. Patient taking differently: Take 81-325 mg by mouth daily as needed for mild pain. 08/20/21   Shon Hale, MD  cefdinir (OMNICEF) 300 MG capsule Take 300 mg by mouth daily. 10/26/21   [provider]  ciprofloxacin-dexamethasone (CIPRODEX) OTIC suspension 3 drops 3 (three) times daily. 11/02/21   [provider]  gabapentin (NEURONTIN)  100 MG capsule Take 100 mg by mouth 2 (two) times daily. 10/06/21   [provider]  hydrOXYzine (VISTARIL) 100 MG capsule Take 100 mg by mouth daily as needed for anxiety. 11/23/20   [provider]  levothyroxine (SYNTHROID) 25 MCG tablet Take 1 tablet (25 mcg total) by mouth daily. 02/02/21   Dani Gobble, NP  lidocaine-prilocaine (EMLA) cream Apply 1 application topically every Monday, Wednesday, and Friday. Prior to dialysis    [provider]  loperamide (IMODIUM A-D) 2 MG tablet Take 2 tablets (4 mg total) by mouth daily as needed for diarrhea or loose stools. Patient taking differently: Take 1-2 tablets by mouth daily as needed for diarrhea or loose stools. 12/17/20   Leroy Sea, MD  midodrine (PROAMATINE) 5 MG tablet Take 1-2 tablets (5-10 mg total) by mouth every Monday, Wednesday, and Friday with hemodialysis. 10/20/21   Rhetta Mura, MD  multivitamin (RENA-VIT) TABS tablet Take 1 tablet by mouth daily.    [provider]  neomycin-polymyxin-hydrocortisone (CORTISPORIN) 3.5-10000-1 OTIC suspension Place 3 drops into the left ear every 6 (six) hours. 10/20/21   Rhetta Mura, MD  nitroGLYCERIN (NITROSTAT) 0.4 MG SL tablet Place 1 tablet (0.4 mg total) under the tongue every 5 (five) minutes x 3 doses as needed for chest pain (if no relief after 2nd dose, proceed to the ED for an evaluation or call 911). 02/19/21   Netta Neat., NP  omeprazole (PRILOSEC) 20 MG capsule Take  1 capsule (20 mg total) by mouth daily. Patient taking differently: Take 40 mg by mouth daily as needed (acid reflux). 12/01/20   Rehman, Joline Maxcy, MD  oxyCODONE (ROXICODONE) 5 MG immediate release tablet Take 1 tablet (5 mg total) by mouth every 4 (four) hours as needed for severe pain. 10/29/21   Charlynne Pander, MD  rosuvastatin (CRESTOR) 10 MG tablet Take 1 tablet (10 mg total) by mouth daily. 05/13/21   Strader, Lennart Pall, PA-C  sevelamer carbonate  (RENVELA) 800 MG tablet Take 3 tablets (2,400 mg total) by mouth 3 (three) times daily with meals. 08/20/21   Shon Hale, MD      Allergies    Amlodipine, Aspirin, Nitrofurantoin, Penicillins, Bactrim [sulfamethoxazole-trimethoprim], Contrast media [iodinated contrast media], Iron, Tylenol [acetaminophen], Gabapentin, Iron sucrose, Ranexa [ranolazine], Sucroferric oxyhydroxide, Dexilant [dexlansoprazole], Hydralazine, Levaquin [levofloxacin in d5w], Morphine and related, Pantoprazole, Plavix [clopidogrel bisulfate], Protonix [pantoprazole sodium], and Venofer [ferric oxide]    Review of Systems   Review of Systems  Psychiatric/Behavioral:  The patient has insomnia.    Physical Exam Updated Vital Signs BP (!) 167/52   Pulse 72   Temp 99.3 F (37.4 C)   Resp 16   SpO2 100%  Physical Exam Constitutional:      Appearance: She is ill-appearing.     Comments: Confused, chronically ill-appearing  HENT:     Ears:     Comments: Left ear canal with discharge.  Unable to visualize the canal.  No tenderness behind the ear bilaterally.  No erythema.    Nose: No congestion.     Mouth/Throat:     Pharynx: Oropharynx is clear.  Eyes:     Extraocular Movements: Extraocular movements intact.     Pupils: Pupils are equal, round, and reactive to light.  Cardiovascular:     Rate and Rhythm: Normal rate.  Pulmonary:     Breath sounds: Wheezing present.     Comments: Lower extremity wheezing bilaterally. Abdominal:     Tenderness: There is no guarding or rebound.  Musculoskeletal:        General: No swelling or tenderness.     Cervical back: Normal range of motion. No rigidity or tenderness.  Skin:    General: Skin is warm.  Neurological:     Mental Status: She is alert. She is confused.     GCS: GCS eye subscore is 4. GCS verbal subscore is 3. GCS motor subscore is 5.     Cranial Nerves: No facial asymmetry.     Sensory: Sensation is intact.     Motor: Weakness present.     Comments:  Generalized diffuse body weakness.    ED Results / Procedures / Treatments   Labs (all labs ordered are listed, but only abnormal results are displayed) Labs Reviewed  CBC - Abnormal; Notable for the following components:      Result Value   WBC 13.8 (*)    RBC 2.27 (*)    Hemoglobin 7.8 (*)    HCT 25.1 (*)    MCV 110.6 (*)    MCH 34.4 (*)    RDW 20.4 (*)    Platelets 128 (*)    All other components within normal limits  BASIC METABOLIC PANEL - Abnormal; Notable for the following components:   Sodium 133 (*)    CO2 20 (*)    Glucose, Bld 129 (*)    BUN 52 (*)    Creatinine, Ser 9.72 (*)    GFR, Estimated 4 (*)  All other components within normal limits  PROTIME-INR - Abnormal; Notable for the following components:   Prothrombin Time 16.0 (*)    INR 1.3 (*)    All other components within normal limits  BRAIN NATRIURETIC PEPTIDE - Abnormal; Notable for the following components:   B Natriuretic Peptide 1,391.8 (*)    All other components within normal limits  CBG MONITORING, ED - Abnormal; Notable for the following components:   Glucose-Capillary 103 (*)    All other components within normal limits  CULTURE, BLOOD (ROUTINE X 2)  CULTURE, BLOOD (ROUTINE X 2)  MAGNESIUM  AMMONIA  VITAMIN B12  FOLATE  I-STAT VENOUS BLOOD GAS, ED    EKG EKG Interpretation  Date/Time:  Monday Nov 08 2021 18:24:28 EDT Ventricular Rate:  76 PR Interval:  150 QRS Duration: 127 QT Interval:  445 QTC Calculation: 501 R Axis:   1 Text Interpretation: Sinus rhythm Probable left atrial enlargement Left bundle branch block LBBB old Confirmed by Coralee Pesa ([id number]) on 11/08/2021 6:26:16 PM  Radiology CT Head Wo Contrast  Result Date: 11/08/2021 CLINICAL DATA:  Acute neurologic deficit EXAM: CT HEAD WITHOUT CONTRAST TECHNIQUE: Contiguous axial images were obtained from the base of the skull through the vertex without intravenous contrast. RADIATION DOSE REDUCTION: This exam was performed  according to the departmental dose-optimization program which includes automated exposure control, adjustment of the mA and/or kV according to patient size and/or use of iterative reconstruction technique. COMPARISON:  Multiple exams, including 08/30/2021 FINDINGS: Brain: Periventricular white matter and corona radiata hypodensities favor chronic ischemic microvascular white matter disease. Otherwise, the brainstem, cerebellum, cerebral peduncles, thalamus, basal ganglia, basilar cisterns, and ventricular system appear within normal limits. No intracranial hemorrhage, mass lesion, or acute CVA. Vascular: There is atherosclerotic calcification of the cavernous carotid arteries bilaterally. Skull: Unremarkable Sinuses/Orbits: Continued complete left mastoid effusion with fluid density filling the middle ear and the external auditory canal on the left. The appearance is compatible with otitis media and entities such as otitis externa and mastoiditis are not excluded. Osseous erosion along the posterior osseous portion of the external auditory canal is again suggested on image 8 series 3. Chronic left sphenoid sinusitis. Other: No supplemental non-categorized findings. IMPRESSION: 1. As on 10/29/2021, there is a large left mastoid effusion associated with complete opacification of the middle ear and complete occlusion of the external auditory canal on the left, compatible with otitis media and possible mastoiditis/otitis externa. 2. Small mucous retention cyst in the left sphenoid sinus, stable. 3. Periventricular white matter and corona radiata hypodensities favor chronic ischemic microvascular white matter disease. 4. Atherosclerosis Electronically Signed   By: Gaylyn Rong M.D.   On: 11/08/2021 15:08   DG Chest Portable 1 View  Result Date: 11/08/2021 CLINICAL DATA:  Weakness, altered level of consciousness EXAM: PORTABLE CHEST 1 VIEW COMPARISON:  10/29/2021 FINDINGS: Single frontal view of the chest  demonstrates a stable cardiac silhouette. Postsurgical changes from CABG. There is increased central vascular congestion, without focal consolidation, effusion, or pneumothorax. Chronic scarring at the left lung base. No acute bony abnormality. IMPRESSION: 1. Increased central vascular congestion without overt edema. Electronically Signed   By: Sharlet Salina M.D.   On: 11/08/2021 19:00    Procedures Procedures   Medications Ordered in ED Medications  cefTRIAXone (ROCEPHIN) 2 g in sodium chloride 0.9 % 100 mL IVPB (has no administration in time range)    ED Course/ Medical Decision Making/ A&P  Medical Decision Making Problems Addressed: Altered mental status, unspecified altered mental status type: acute illness or injury that poses a threat to life or bodily functions Encephalopathy acute: acute illness or injury that poses a threat to life or bodily functions Otitis externa of left ear, unspecified chronicity, unspecified type: acute illness or injury that poses a threat to life or bodily functions  Amount and/or Complexity of Data Reviewed Labs: ordered. Decision-making details documented in ED Course. Radiology: ordered and independent interpretation performed. Decision-making details documented in ED Course. ECG/medicine tests: ordered and independent interpretation performed. Decision-making details documented in ED Course.  Risk Decision regarding hospitalization.   Patient is 81 year old female presents to the emergency department due to concern for weakness and altered mental status.  Per chart review, patient is end-stage renal disease on hemodialysis.  She was also recently started on antibiotic for left ear infection.  Initial vital signs are within the reference range.  Her blood pressure is mildly elevated.  Exam as above.  The differential includes toxic metabolic etiologies such as electrolyte disturbances (Na/Ca), hypoglycemia, and uremia;  acidosis states, infection (i.e. Sepsis); toxidromes of intoxication or withdrawal, hypoxemia or hypercarbia, liver disease or failure causing hepatic encephalopathy, endocrine emergencies (hyper/hypothyroidism, adrenal insufficiency), seizure, trauma, intracranial bleeds or ischemic stroke. Given this wide differential, will send basic labs and lytes to evaluate for metabolic causes, FSBS, LFTs,CT head, and blood gas.  Patient CBC with mild leukocytosis.  Hemoglobin and hematocrit are low however baseline for her.  She also have mild thrombocytopenia.  BMP remarkable for uptrending creatinine.  Potassium is 4.3.  EKG did not show any peaked T waves.  Her BUN is uptrending at 52.  Patient baseline BUN is in the 30s.  This is concerning for possible uremic encephalopathy.  BNP is also elevated around 1300.  Ammonia is unremarkable.  Blood cultures are pending.  VBG is pending.  CT head shows complete large left mastoid effusion associated with complete opacification of the middle ear and complete occlusion of the external auditory canal on the left, compatible with otitis media and possible mastoiditis/otitis externa.  However on exam patient does not have swelling behind ears or tenderness.  Her presentation is most likely otitis externa.  Less concern for meningitis at this time.  No systemic signs such as tachycardia or fever at this point.  Furthermore no meningeal signs on physical exam.  Spoke with Dr. Signe Colt (nephrology) who is planning to do dialysis this evening.  Patient will require admission for possible uremic encephalopathy and further work-up.  In addition we will treat with Rocephin and due to concern for possible mastoiditis versus ear infection. All pertinent information has been given care with admitting team.  Final Clinical Impression(s) / ED Diagnoses Final diagnoses:  Altered mental status, unspecified altered mental status type  Encephalopathy acute  Otitis externa of left ear,  unspecified chronicity, unspecified type    Rx / DC Orders ED Discharge Orders     None         Jari Sportsman, MD 11/08/21 2027    Rozelle Logan, DO 11/08/21 2340

## 2021-11-08 NOTE — ED Notes (Signed)
RN unable to obtain PIV access. RN attempted w/ ultrasound, and MD attempted w/ ultrasound, both attempts unsuccessful. Unable to obtain labs. MD at bedside to place EJ.  ?

## 2021-11-08 NOTE — Telephone Encounter (Signed)
Spoke with the patient and she is scheduled with Dr. Delana Meyer for a left arm fistulagram on 11/16/21 with a 9:00 am arrival time to the MM. Pre-procedure instructions were discussed and will be mailed. ?

## 2021-11-08 NOTE — Consult Note (Signed)
Reason for Consult: To manage dialysis and dialysis related needs ?Referring Physician: Dr Hal Hope ? ?Shawna [name] is an 81 y.o. female.  ?HPI: Pt is an 89F with a PMH sig for HTN, HLD, DM II, CAD s/p CABG, recurrent GIB, Afib/ flutter, and recent dx of mastoiditis who is now seen in consultation at the request of Dr Hal Hope for provision of HD and management of ESRD.   ? ?Pt accompanied by her husband who helps provide history.  Had progressive weakness beginning yesterday at church, worsening today.  Was unable to walk and was falling asleep so he brought her here.  In ED. BUN 52 Cr 9.72.  WBC ct 13.8, Hgb 7.8 plts 128.  CXR with mild vasc congestion and CT head with L ear effusion and mastoiditis, known from prior CT 4/21, no acute infarct.  EKG with old LBBB.  Cultures drawn, started on antibiotics, MRI brain ordered.  In this setting we are asked to see.   ? ?Pt is sleepy and not her usual self- she is normally talkative and is able to answer all her own questions about her health history.  She does not report any pain.  She intermittently falls asleep but when awakened answers appropriately.  Has been on oral antibiotics (Omnicef) and ciprodex gtts for her ear.  Is following with ENT.  She is very adherent to dialysis and does not miss treatments.   ? ?Dialyzes at [facility]. ? ?Past Medical History:  ?Diagnosis Date  ? Acute on chronic respiratory failure with hypoxia ([place]) 10/10/2016  ? Anxiety   ? Arthritis   ? AVM (arteriovenous malformation) of colon   ? CAD (coronary artery disease)   ? a. s/p CABG in 2013 b. DES to D1 in 10/2016. c. cath in 07/2018 showing patent grafts with occlusion of D1 at prior stent site and progression of PDA disease --> medical management recommended  ? Carotid artery disease ([place])   ? a. 09-38% LICA, 07/8297   ? Chronic anemia   ? Chronic bronchitis ([place])   ? Chronic diastolic CHF (congestive heart failure) ([place])   ? a. 02/2012 Echo EF 60-65%, nl wall motion, Gr 1 DD, mod  MR  ? Colon cancer ([place]) 1992  ? Esophageal stricture   ? ESRD on hemodialysis ([place])   ? ESRD due to HTN, started dialysis 2011 and gets HD at [facility] with Dr Hinda Lenis on MWF schedule.  Access is LUA AVF as of Sept 2014.   ? GERD (gastroesophageal reflux disease)   ? High cholesterol 12/2011  ? History of blood transfusion 07/2011; 12/2011; 01/2012 X 2; 04/2012  ? History of gout   ? History of lower GI bleeding   ? Hypertension   ? Iron deficiency anemia   ? Jugular vein occlusion, right ([place])   ? Mitral regurgitation   ? a. Moderate by echo, 02/2012  ? Myocardial infarction [facility])   ? NSVT (nonsustained ventricular tachycardia) ([place])   ? Ovarian cancer ([place]) 1992  ? PAF (paroxysmal atrial fibrillation) ([place])   ? Pneumonia ~ 2009  ? PUD (peptic ulcer disease)   ? TIA (transient ischemic attack)   ? ? ?Past Surgical History:  ?Procedure Laterality Date  ? A/V SHUNTOGRAM Left 03/19/2019  ? Procedure: A/V SHUNTOGRAM;  Surgeon: Katha Cabal, MD;  Location: [place] CV LAB;  Service: Cardiovascular;  Laterality: Left;  ? ABDOMINAL HYSTERECTOMY  1992  ? APPENDECTOMY  06/1990  ? AV FISTULA PLACEMENT  07/2009  ?  left upper arm  ? AV FISTULA PLACEMENT Right 09/06/2016  ? Procedure: RIGHT FOREARM ARTERIOVENOUS (AV) GRAFT;  Surgeon: Elam Dutch, MD;  Location: [place];  Service: Vascular;  Laterality: Right;  ? AV FISTULA PLACEMENT N/A 02/24/2017  ? Procedure: INSERTION OF ARTERIOVENOUS (AV) GORE-TEX GRAFT ARM (BRACHIAL AXILLARY);  Surgeon: Katha Cabal, MD;  Location: [facility] ORS;  Service: Vascular;  Laterality: N/A;  ? Colon REMOVAL Right 09/06/2016  ? Procedure: REMOVAL OF Right Arm ARTERIOVENOUS GORETEX GRAFT and Vein Patch angioplasty of brachial artery;  Surgeon: Angelia Mould, MD;  Location: [place];  Service: Vascular;  Laterality: Right;  ? BIOPSY  09/26/2019  ? Procedure: BIOPSY;  Surgeon: Rogene Houston, MD;  [facility];  Service: Endoscopy;;  ? COLON RESECTION  1992  ? COLON SURGERY     ? COLONOSCOPY N/A 03/09/2019  ? Procedure: COLONOSCOPY;  Surgeon: Rogene Houston, MD;  [facility];  Service: Endoscopy;  Laterality: N/A;  ? COLONOSCOPY WITH PROPOFOL N/A 06/08/2021  ? Procedure: COLONOSCOPY WITH PROPOFOL;  Surgeon: Harvel Quale, MD;  [facility];  Service: Gastroenterology;  Laterality: N/A;  9:05 /Patient is on dialysis Mon Wed Fri  ? CORONARY ANGIOPLASTY WITH STENT PLACEMENT  12/15/11  ? "2"  ? CORONARY ANGIOPLASTY WITH STENT PLACEMENT  y/2013  ? "1; makes total of 3" (05/02/2012)  ? CORONARY ARTERY BYPASS GRAFT  06/13/2012  ? Procedure: CORONARY ARTERY BYPASS GRAFTING (CABG);  Surgeon: Grace Isaac, MD;  Location: [place];  Service: Open Heart Surgery;  Laterality: N/A;  cabg x four;  using left internal mammary artery, and left leg greater saphenous vein harvested endoscopically  ? CORONARY STENT INTERVENTION N/A 10/13/2016  ? Procedure: Coronary Stent Intervention;  Surgeon: Troy Sine, MD;  Location: [place] [facility];  Service: Cardiovascular;  Laterality: N/A;  ? DIALYSIS/PERMA CATHETER REMOVAL N/A 04/18/2017  ? Procedure: DIALYSIS/PERMA CATHETER REMOVAL;  Surgeon: Katha Cabal, MD;  Location: [place] CV LAB;  Service: Cardiovascular;  Laterality: N/A;  ? DILATION AND CURETTAGE OF UTERUS    ? ENTEROSCOPY N/A 06/08/2021  ? Procedure: PUSH ENTEROSCOPY;  Surgeon: Harvel Quale, MD;  [facility];  Service: Gastroenterology;  Laterality: N/A;  ? ESOPHAGOGASTRODUODENOSCOPY  01/20/2012  ? Procedure: ESOPHAGOGASTRODUODENOSCOPY (EGD);  Surgeon: Ladene Artist, MD,FACG;  Location[facility] END[facility];  Service: Endoscopy;  Laterality: N/A;  ? ESOPHAGOGASTRODUODENOSCOPY N/A 03/26/2013  ? Procedure: ESOPHAGOGASTRODUODENOSCOPY (EGD);  Surgeon: Irene Shipper, MD;  Location: [facility] ENDOSCOPY;  Service: Endoscopy;  Laterality: N/A;  ? ESOPHAGOGASTRODUODENOSCOPY N/A 04/30/2015  ? Procedure: ESOPHAGOGASTRODUODENOSCOPY (EGD);  Surgeon: Rogene Houston, MD;  [facility];  Service: Endoscopy;  Laterality: N/A;  1pm - moved to 10/20 @ 1:10  ? ESOPHAGOGASTRODUODENOSCOPY N/A 07/29/2016  ? Procedure: ESOPHAGOGASTRODUODENOSCOPY (EGD);  Surgeon: Manus Gunning, MD;  Location: [place];  Service: Gastroenterology;  Laterality: N/A;  enteroscopy  ? ESOPHAGOGASTRODUODENOSCOPY N/A 09/26/2019  ? Procedure: ESOPHAGOGASTRODUODENOSCOPY (EGD);  Surgeon: Rogene Houston, MD;  [facility];  Service: Endoscopy;  Laterality: N/A;  1250  ? ESOPHAGOGASTRODUODENOSCOPY (EGD) WITH PROPOFOL N/A 02/05/2021  ? Procedure: ESOPHAGOGASTRODUODENOSCOPY (EGD) WITH PROPOFOL;  Surgeon: Eloise Harman, DO;  [facility];  Service: Endoscopy;  Laterality: N/A;  ? GIVENS CAPSULE STUDY N/A 03/07/2019  ? Procedure: GIVENS CAPSULE STUDY;  Surgeon: Rogene Houston, MD;  [facility];  Service: Endoscopy;  Laterality: N/A;  7:30  ? GIVENS  CAPSULE STUDY N/A 04/22/2021  ? Procedure: GIVENS CAPSULE STUDY;  Surgeon: Rogene Houston, MD;  [facility];  Service: Endoscopy;  Laterality: N/A;  7:30  ? INSERTION OF DIALYSIS CATHETER N/A 10/05/2020  ? Procedure: ABORTED TUNNELED DIALYSIS CATHETER PLACEMENT RIGHT INTERNAL JUGULAR VEIN ;  Surgeon: Virl Cagey, MD;  [facility];  Service: General;  Laterality: N/A;  ? INTRAOPERATIVE TRANSESOPHAGEAL ECHOCARDIOGRAM  06/13/2012  ? Procedure: INTRAOPERATIVE TRANSESOPHAGEAL ECHOCARDIOGRAM;  Surgeon: Grace Isaac, MD;  Location: [place];  Service: Open Heart Surgery;  Laterality: N/A;  ? IR DIALY SHUNT INTRO NEEDLE/INTRACATH INITIAL W/IMG LEFT Left 10/06/2020  ? IR FLUORO GUIDE CV LINE RIGHT  06/17/2020  ? IR GENERIC HISTORICAL  07/26/2016  ? IR FLUORO GUIDE CV LINE RIGHT 07/26/2016 Greggory Keen, MD [facility]  ? IR GENERIC HISTORICAL  07/26/2016  ? IR US GUIDE VASC ACCESS RIGHT 07/26/2016 Greggory Keen, MD [facility]  ? IR GENERIC HISTORICAL  08/02/2016  ? IR US GUIDE VASC ACCESS  RIGHT 08/02/2016 Greggory Keen, MD [facility]  ? IR GENERIC HISTORICAL  08/02/2016  ? IR FLUORO GUIDE CV LINE RIGHT 08/02/2016 Greggory Keen, MD [facility]  ? IR RADIOLOGY PERIPHERAL GUIDED IV START  9

## 2021-11-08 NOTE — ED Triage Notes (Addendum)
Pt states "I just couldn't sleep last night" when asked why she came to the hospital. Pt unable to elaborate. Pt has no other complaints at this time, no family with her, states her husband brought her in. ? ?Husband found in lobby: reports pt has not been able to walk since yesterday and has been slow to respond to questions. ?

## 2021-11-08 NOTE — ED Notes (Signed)
Unable to obtain labs in triage  

## 2021-11-08 NOTE — ED Notes (Signed)
Reject x2 ?

## 2021-11-08 NOTE — ED Notes (Signed)
Pt and her husband report that pt does not make any urine. ?

## 2021-11-08 NOTE — ED Provider Triage Note (Signed)
Emergency Medicine Provider Triage Evaluation Note ? ?Shawna [name] , a 81 y.o. female  was evaluated in triage.  Pt complains of weakness.  Patient husband accompanies patient provides some detail.  In triage, the patient is slow to respond.  She continually falls asleep during examination and interview.  Patient husband states that for the last 2 days patient has been unable to walk.  The patient has been reports that the patient walks fine at baseline.  Patient was seen in this ED 1 week ago and diagnosed with ear infection.  The patient has been compliant on antibiotics at that since this time. ? ?Review of Systems  ?Positive:  ?Negative:  ? ?Physical Exam  ?BP (!) 170/46 (BP Location: Right Arm)   Pulse 69   Temp 99.3 ?F (37.4 ?C)   Resp 16   SpO2 98%  ?Gen:   Awake, no distress   ?Resp:  Normal effort  ?MSK:   Moves extremities without difficulty  ?Other:  Patient slow to respond, slightly slow to follow commands.  Patient unable to perform heel-to-shin maneuver. ? ?Medical Decision Making  ?Medically screening exam initiated at 2:15 PM.  Appropriate orders placed.  Shawna [name] was informed that the remainder of the evaluation will be completed by another provider, this initial triage assessment does not replace that evaluation, and the importance of remaining in the ED until their evaluation is complete. ? ? ?  ?Azucena Cecil, PA-C ?11/08/21 1416 ? ?

## 2021-11-09 ENCOUNTER — Observation Stay (HOSPITAL_COMMUNITY): Payer: Medicare HMO

## 2021-11-09 DIAGNOSIS — G9340 Encephalopathy, unspecified: Secondary | ICD-10-CM | POA: Diagnosis present

## 2021-11-09 DIAGNOSIS — I5042 Chronic combined systolic (congestive) and diastolic (congestive) heart failure: Secondary | ICD-10-CM | POA: Diagnosis present

## 2021-11-09 DIAGNOSIS — Z85038 Personal history of other malignant neoplasm of large intestine: Secondary | ICD-10-CM | POA: Diagnosis not present

## 2021-11-09 DIAGNOSIS — I10 Essential (primary) hypertension: Secondary | ICD-10-CM | POA: Diagnosis not present

## 2021-11-09 DIAGNOSIS — Z951 Presence of aortocoronary bypass graft: Secondary | ICD-10-CM | POA: Diagnosis not present

## 2021-11-09 DIAGNOSIS — B965 Pseudomonas (aeruginosa) (mallei) (pseudomallei) as the cause of diseases classified elsewhere: Secondary | ICD-10-CM | POA: Diagnosis present

## 2021-11-09 DIAGNOSIS — T424X5A Adverse effect of benzodiazepines, initial encounter: Secondary | ICD-10-CM | POA: Diagnosis present

## 2021-11-09 DIAGNOSIS — G9341 Metabolic encephalopathy: Secondary | ICD-10-CM | POA: Diagnosis present

## 2021-11-09 DIAGNOSIS — I132 Hypertensive heart and chronic kidney disease with heart failure and with stage 5 chronic kidney disease, or end stage renal disease: Secondary | ICD-10-CM | POA: Diagnosis present

## 2021-11-09 DIAGNOSIS — Z992 Dependence on renal dialysis: Secondary | ICD-10-CM | POA: Diagnosis not present

## 2021-11-09 DIAGNOSIS — I2510 Atherosclerotic heart disease of native coronary artery without angina pectoris: Secondary | ICD-10-CM | POA: Diagnosis present

## 2021-11-09 DIAGNOSIS — I471 Supraventricular tachycardia: Secondary | ICD-10-CM | POA: Diagnosis not present

## 2021-11-09 DIAGNOSIS — E538 Deficiency of other specified B group vitamins: Secondary | ICD-10-CM | POA: Diagnosis present

## 2021-11-09 DIAGNOSIS — E1122 Type 2 diabetes mellitus with diabetic chronic kidney disease: Secondary | ICD-10-CM | POA: Diagnosis present

## 2021-11-09 DIAGNOSIS — Y92009 Unspecified place in unspecified non-institutional (private) residence as the place of occurrence of the external cause: Secondary | ICD-10-CM | POA: Diagnosis not present

## 2021-11-09 DIAGNOSIS — F05 Delirium due to known physiological condition: Secondary | ICD-10-CM | POA: Diagnosis not present

## 2021-11-09 DIAGNOSIS — H7092 Unspecified mastoiditis, left ear: Secondary | ICD-10-CM | POA: Diagnosis present

## 2021-11-09 DIAGNOSIS — H6022 Malignant otitis externa, left ear: Secondary | ICD-10-CM | POA: Diagnosis present

## 2021-11-09 DIAGNOSIS — E440 Moderate protein-calorie malnutrition: Secondary | ICD-10-CM | POA: Diagnosis present

## 2021-11-09 DIAGNOSIS — I480 Paroxysmal atrial fibrillation: Secondary | ICD-10-CM | POA: Diagnosis present

## 2021-11-09 DIAGNOSIS — I5022 Chronic systolic (congestive) heart failure: Secondary | ICD-10-CM | POA: Diagnosis not present

## 2021-11-09 DIAGNOSIS — T426X5A Adverse effect of other antiepileptic and sedative-hypnotic drugs, initial encounter: Secondary | ICD-10-CM | POA: Diagnosis present

## 2021-11-09 DIAGNOSIS — A498 Other bacterial infections of unspecified site: Secondary | ICD-10-CM | POA: Diagnosis not present

## 2021-11-09 DIAGNOSIS — E560 Deficiency of vitamin E: Secondary | ICD-10-CM | POA: Diagnosis present

## 2021-11-09 DIAGNOSIS — I340 Nonrheumatic mitral (valve) insufficiency: Secondary | ICD-10-CM | POA: Diagnosis present

## 2021-11-09 DIAGNOSIS — D631 Anemia in chronic kidney disease: Secondary | ICD-10-CM | POA: Diagnosis present

## 2021-11-09 DIAGNOSIS — N186 End stage renal disease: Secondary | ICD-10-CM | POA: Diagnosis present

## 2021-11-09 DIAGNOSIS — T40605A Adverse effect of unspecified narcotics, initial encounter: Secondary | ICD-10-CM | POA: Diagnosis present

## 2021-11-09 DIAGNOSIS — I4892 Unspecified atrial flutter: Secondary | ICD-10-CM | POA: Diagnosis present

## 2021-11-09 DIAGNOSIS — T361X1A Poisoning by cephalosporins and other beta-lactam antibiotics, accidental (unintentional), initial encounter: Secondary | ICD-10-CM | POA: Diagnosis present

## 2021-11-09 DIAGNOSIS — H6092 Unspecified otitis externa, left ear: Secondary | ICD-10-CM | POA: Diagnosis not present

## 2021-11-09 LAB — BASIC METABOLIC PANEL
Anion gap: 16 — ABNORMAL HIGH (ref 5–15)
BUN: 61 mg/dL — ABNORMAL HIGH (ref 8–23)
CO2: 20 mmol/L — ABNORMAL LOW (ref 22–32)
Calcium: 9 mg/dL (ref 8.9–10.3)
Chloride: 97 mmol/L — ABNORMAL LOW (ref 98–111)
Creatinine, Ser: 10.9 mg/dL — ABNORMAL HIGH (ref 0.44–1.00)
GFR, Estimated: 3 mL/min — ABNORMAL LOW (ref >60)
Glucose, Bld: 110 mg/dL — ABNORMAL HIGH (ref 70–99)
Potassium: 4 mmol/L (ref 3.5–5.1)
Sodium: 133 mmol/L — ABNORMAL LOW (ref 135–145)

## 2021-11-09 LAB — CBC
HCT: 21.8 % — ABNORMAL LOW (ref 36.0–46.0)
Hemoglobin: 7 g/dL — ABNORMAL LOW (ref 12.0–15.0)
MCH: 35.2 pg — ABNORMAL HIGH (ref 26.0–34.0)
MCHC: 32.1 g/dL (ref 30.0–36.0)
MCV: 109.5 fL — ABNORMAL HIGH (ref 80.0–100.0)
Platelets: UNDETERMINED 10*3/uL (ref 150–400)
RBC: 1.99 MIL/uL — ABNORMAL LOW (ref 3.87–5.11)
RDW: 20.5 % — ABNORMAL HIGH (ref 11.5–15.5)
WBC: 12.1 10*3/uL — ABNORMAL HIGH (ref 4.0–10.5)
nRBC: 0 % (ref 0.0–0.2)

## 2021-11-09 LAB — HEPATITIS B SURFACE ANTIBODY,QUALITATIVE: Hep B S Ab: REACTIVE — AB

## 2021-11-09 LAB — HEPATITIS B SURFACE ANTIGEN: Hepatitis B Surface Ag: NONREACTIVE

## 2021-11-09 MED ORDER — TOBRAMYCIN-DEXAMETHASONE 0.3-0.1 % OP SUSP
2.0000 [drp] | Freq: Four times a day (QID) | OPHTHALMIC | Status: DC
  Administered 2021-11-09 – 2021-11-24 (×56): 2 [drp] via OTIC
  Filled 2021-11-09 (×3): qty 2.5

## 2021-11-09 MED ORDER — METOPROLOL TARTRATE 5 MG/5ML IV SOLN
INTRAVENOUS | Status: AC
  Administered 2021-11-09: 5 mg via INTRAVENOUS
  Filled 2021-11-09: qty 5

## 2021-11-09 MED ORDER — TOBRAMYCIN-DEXAMETHASONE 0.3-0.1 % OP SUSP
2.0000 [drp] | Freq: Four times a day (QID) | OPHTHALMIC | Status: DC
  Filled 2021-11-09 (×2): qty 2.5

## 2021-11-09 MED ORDER — METOPROLOL TARTRATE 5 MG/5ML IV SOLN: 5.0000 mg | Freq: Once | INTRAVENOUS | Status: AC

## 2021-11-09 MED ORDER — MIDODRINE HCL 5 MG PO TABS
10.0000 mg | ORAL_TABLET | Freq: Once | ORAL | Status: AC
  Administered 2021-11-09: 10 mg via ORAL
  Filled 2021-11-09: qty 2

## 2021-11-09 NOTE — Progress Notes (Signed)
Reported off to 5W ?

## 2021-11-09 NOTE — ED Notes (Signed)
Placed breakfast order ?

## 2021-11-09 NOTE — Progress Notes (Signed)
Pt arrived to floor around 1130 via stretcher by transporter in no acute distress. 2 person assist to transfer pt over to hospital bed. Pt stated she was too weak to move herself. VSS. Respirations even and unlabored on room air. Pt alert and oriented to self only. Pt oriented to room. Pt encouraged to use call bell. Pt reminded not to get out of bed unassisted. Bed alarm on. Bed in low position and call bell within reach.  ?

## 2021-11-09 NOTE — Progress Notes (Signed)
Pt receives out-pt HD at [facility] on MWF. Pt arrives at 10:30 for 10:45 chair time. Will assist as needed.  ? ?Melven Sartorius ?Renal Navigator ?[phone] ?

## 2021-11-09 NOTE — ED Notes (Signed)
Patient transported to dialysis

## 2021-11-09 NOTE — Care Management Obs Status (Signed)
MEDICARE OBSERVATION STATUS NOTIFICATION ? ? ?Patient Details  ?Name: Shawna [name] ?MRN: [id number] ?Date of Birth: [birth year] ? ? ?Medicare Observation Status Notification Given:  Yes ? ? ? ?Carles Collet, RN ?11/09/2021, 3:59 PM ?

## 2021-11-09 NOTE — ED Notes (Signed)
Patient transported to MRI 

## 2021-11-09 NOTE — Progress Notes (Addendum)
[facility] ?Progress Note ? ?Subjective: seen in room. Had afib w/ RVR during HD session, rx'd w/ IV metoprolol 5 mg x 1 w/ good response.  ? ?Vitals:  ? 11/09/21 1030 11/09/21 1034 11/09/21 1058 11/09/21 1133  ?BP: (!) 145/38 (!) 138/58  123/65  ?Pulse:    97  ?Resp: (!) 22 15 (!) 23 15  ?Temp:  97.7 ?F (36.5 ?C)  98.4 ?F (36.9 ?C)  ?TempSrc:  Oral  Oral  ?SpO2:  98%  99%  ?Weight:    56.2 kg  ?Height:    '5\' 1"'$  (1.549 m)  ? ? ?Exam: ?GEN NAD, lying in bed ?HEENT EOMI PERRL ?NECK R EJ IV in place, no overt JVD ?PULM clear ?CV RRR ?ABD soft ?EXT trace LE edema ?NEURO alert, no fully oriented ?SKIN warm and well- perfused ?ACCESS: L AVF + T/B ?  ?OP HD: [facility] MWF ?  3.5h ?  LUA AVF ?- records pending ?- 5/01 lab showed HBsAg neg w/ Abs > 10/ protective ? ? ?Assessment/Plan: ?AMS/ gen'd weakness: multifactorial- sepsis +/- CVA (MRI ordered), polypharmacy. Has good HD compliance.  Getting antibiotics and MRI per primary.   ?ESRD: normally MWF. Missed OP HD Monday. HD today and HD again tomorrow to get back on schedule.  ?Hypertension: reasonably well-controlled ?Anemia of ESRD:  hgb 7.8, higher than 4/21 value, will give ESA with HD ?Metabolic Bone Disease: binder and vitamin when eating ?Atrial fib - w/ RVR on HD today, we gave her IV metoprolol '5mg'$  x 1 w/ good response.  ?Hx CAD/ sp CABG - on statin ?L ear mastoiditis: seen on CT temporal bones 4/21 and again on CT here 11/08/21.  Per husband, following with ENT and getting PO antibiotics and gtts. Getting IV antibiotics per primary. ENT consulting.  ?Hx of colon and ovarian cancer ? ? ? ? ?Rob Doctor, hospital ?11/09/2021, 4:14 PM ? ? ?Recent Labs  ?Lab 11/08/21 ?1800 11/08/21 ?2230 11/08/21 ?2242 11/09/21 ?0420  ?HGB 7.8* 7.2* 7.5* 7.0*  ?CALCIUM 9.1  --   --  9.0  ?CREATININE 9.72* 10.33*  --  10.90*  ?K 4.3  --  3.9 4.0  ? ?Inpatient medications: ? amiodarone  100 mg Oral Daily  ? Chlorhexidine Gluconate Cloth  6 each Topical Q0600  ? levothyroxine  25  mcg Oral Q0600  ? [START ON 11/10/2021] midodrine  5-10 mg Oral Q M,W,F-HD  ? multivitamin  1 tablet Oral Daily  ? rosuvastatin  10 mg Oral Daily  ? sevelamer carbonate  2,400 mg Oral TID WC  ? tobramycin-dexamethasone  2 drop Left EAR Q6H  ? ? sodium chloride    ? sodium chloride    ? cefTRIAXone (ROCEPHIN)  IV    ? ?sodium chloride, sodium chloride, alteplase, heparin, lidocaine (PF), lidocaine-prilocaine, pentafluoroprop-tetrafluoroeth ? ? ? ? ? ? ?

## 2021-11-09 NOTE — Progress Notes (Signed)
?PROGRESS NOTE ? ? ? ?TENYA ARAQUE  MBT:[id number] DOB: 1940-12-17 DOA: 11/08/2021 ?PCP: [facility] ? ? ?Brief Narrative:  ?Shawna [name] is a 81 y.o. female with history of paroxysmal atrial fibrillation not on anticoagulation secondary to history of GI bleed, CAD status post CABG and stent placement, ESRD on hemodialysis on Monday Wednesday Friday, prior history of colon cancer, hyperlipidemia was brought to the ER after patient was found to be increasingly confused and weak over the last 24 hours.   ? ?Of note approximately 10 days prior to admission patient presented to the ED with left earache with external otitis media and mastoiditis placed on oral antibiotics at that time.  ? ?Assessment & Plan: ?  ?Principal Problem: ?  Acute encephalopathy ?Active Problems: ?  Hx of CABG ?  Essential hypertension ?  History of colon cancer ?  History of ovarian cancer ?  ESRD (end stage renal disease) on dialysis [facility]) ?  PAF (paroxysmal atrial fibrillation) ([place]) ? ? ?Acute metabolic versus toxic encephalopathy, POA ?Rule out polypharmacy ?-Unclear etiology, hold patient's gabapentin alprazolam and oxycodone ?-Questionable mastoiditis/otitis media ongoing as below ?-Continue to monitor clinically, follow cultures ?-Urgent dialysis in setting of volume overload, uremia ? ?Subacute mastoiditis versus otitis media, questionably ongoing  ?-Previously treated outpatient with improving symptoms per patient and family (no longer having ear pain) ?-ENT (Dr Janace Hoard) to follow up ?-Continue ceftriaxone at this time for coverage -follow-up cultures, patient does meet SIRS criteria but likely secondary to above AME and missed ESRD - unclear if active infection ongoing or simply unchanged imaging due to previous infection. ? ?ESRD Monday Wednesday Friday ?-Missed dialysis Monday, make-up session today per nephrology -appreciate insight and recommendations ? ?History of paroxysmal atrial fibrillation not on anticoagulation  secondary to GI bleed  ?-Currently rate controlled on amiodarone. ? ?History of CAD status post CABG -continue home statin  ?Chronic anemia of chronic disease in the setting of ESRD -stable, defer to nephrology for EPO versus transfusion  ?History of colon cancer and ovarian cancer. -Completed previous therapy ?   ?DVT prophylaxis: SCDs for now ?Code Status: Full code. ?Family Communication: None present ? ?Status is: Inpatient ? ?Dispo: The patient is from: Home ?             Anticipated d/c is to: To be determined ?             Anticipated d/c date is: 48 to 72 hours ?             Patient currently not medically stable for discharge ? ?Consultants:  ?Nephrology, ENT ? ?Procedures:  ?None ? ?Antimicrobials:  ?Ceftriaxone ? ?Subjective: ?No acute issues or events overnight ? ?Objective: ?Vitals:  ? 11/09/21 0724 11/09/21 0730 11/09/21 0745 11/09/21 0800  ?BP: (!) 188/69 (!) 170/75 (!) 148/63 (!) 158/71  ?Pulse:      ?Resp: 20 (!) '21 18 15  '$ ?Temp:      ?TempSrc:      ?SpO2:  99%    ? ? ?Intake/Output Summary (Last 24 hours) at 11/09/2021 5364 ?Last data filed at 11/08/2021 2135 ?Gross per 24 hour  ?Intake 103.02 ml  ?Output --  ?Net 103.02 ml  ? ?Filed Weights  ? ? ?Examination: ? ?General:  Pleasantly resting in bed, No acute distress.  Oriented to person only ?HEENT:  Normocephalic atraumatic.  Sclerae nonicteric, noninjected.  Extraocular movements intact bilaterally. ?Neck:  Without mass or deformity.  Trachea is midline. ?Lungs:  Clear to auscultate  bilaterally without rhonchi, wheeze, or rales. ?Heart:  Regular rate and rhythm.  Without murmurs, rubs, or gallops. ?Abdomen:  Soft, nontender, nondistended.  Without guarding or rebound. ?Extremities: Without cyanosis, clubbing, edema, or obvious deformity. ?Vascular:  Dorsalis pedis and posterior tibial pulses palpable bilaterally. ?Skin:  Warm and dry, no erythema, no ulcerations. ? ?Data Reviewed: I have personally reviewed following labs and imaging  studies ? ?CBC: ?Recent Labs  ?Lab 11/08/21 ?1800 11/08/21 ?2230 11/08/21 ?2242 11/09/21 ?0420  ?WBC 13.8* 12.3*  --  12.1*  ?HGB 7.8* 7.2* 7.5* 7.0*  ?HCT 25.1* 22.9* 22.0* 21.8*  ?MCV 110.6* 110.1*  --  109.5*  ?PLT 128* 130*  --  PLATELET CLUMPS NOTED ON SMEAR, UNABLE TO ESTIMATE  ? ?Basic Metabolic Panel: ?Recent Labs  ?Lab 11/08/21 ?1800 11/08/21 ?2230 11/08/21 ?2242 11/09/21 ?0420  ?NA 133*  --  132* 133*  ?K 4.3  --  3.9 4.0  ?CL 98  --   --  97*  ?CO2 20*  --   --  20*  ?GLUCOSE 129*  --   --  110*  ?BUN 52*  --   --  61*  ?CREATININE 9.72* 10.33*  --  10.90*  ?CALCIUM 9.1  --   --  9.0  ?MG 1.9  --   --   --   ? ?GFR: ?Estimated Creatinine Clearance: 3.1 mL/min (A) (by C-G formula based on SCr of 10.9 mg/dL (H)). ?Liver Function Tests: ?No results for input(s): AST, ALT, ALKPHOS, BILITOT, PROT, ALBUMIN in the last 168 hours. ?No results for input(s): LIPASE, AMYLASE in the last 168 hours. ?Recent Labs  ?Lab 11/08/21 ?1800  ?AMMONIA 33  ? ?Coagulation Profile: ?Recent Labs  ?Lab 11/08/21 ?1800  ?INR 1.3*  ? ?Cardiac Enzymes: ?No results for input(s): CKTOTAL, CKMB, CKMBINDEX, TROPONINI in the last 168 hours. ?BNP (last 3 results) ?No results for input(s): PROBNP in the last 8760 hours. ?HbA1C: ?No results for input(s): HGBA1C in the last 72 hours. ?CBG: ?Recent Labs  ?Lab 11/08/21 ?1657  ?GLUCAP 103*  ? ?Lipid Profile: ?No results for input(s): CHOL, HDL, LDLCALC, TRIG, CHOLHDL, LDLDIRECT in the last 72 hours. ?Thyroid Function Tests: ?No results for input(s): TSH, T4TOTAL, FREET4, T3FREE, THYROIDAB in the last 72 hours. ?Anemia Panel: ?Recent Labs  ?  11/08/21 ?1800  ?VITAMINB12 319  ?FOLATE >40.0  ? ?Sepsis Labs: ?No results for input(s): PROCALCITON, LATICACIDVEN in the last 168 hours. ? ?No results found for this or any previous visit (from the past 240 hour(s)).  ? ? ? ? ? ?Radiology Studies: ?CT Head Wo Contrast ? ?Result Date: 11/08/2021 ?CLINICAL DATA:  Acute neurologic deficit EXAM: CT HEAD WITHOUT  CONTRAST TECHNIQUE: Contiguous axial images were obtained from the base of the skull through the vertex without intravenous contrast. RADIATION DOSE REDUCTION: This exam was performed according to the departmental dose-optimization program which includes automated exposure control, adjustment of the mA and/or kV according to patient size and/or use of iterative reconstruction technique. COMPARISON:  Multiple exams, including 08/30/2021 FINDINGS: Brain: Periventricular white matter and corona radiata hypodensities favor chronic ischemic microvascular white matter disease. Otherwise, the brainstem, cerebellum, cerebral peduncles, thalamus, basal ganglia, basilar cisterns, and ventricular system appear within normal limits. No intracranial hemorrhage, mass lesion, or acute CVA. Vascular: There is atherosclerotic calcification of the cavernous carotid arteries bilaterally. Skull: Unremarkable Sinuses/Orbits: Continued complete left mastoid effusion with fluid density filling the middle ear and the external auditory canal on the left. The appearance is compatible with otitis media  and entities such as otitis externa and mastoiditis are not excluded. Osseous erosion along the posterior osseous portion of the external auditory canal is again suggested on image 8 series 3. Chronic left sphenoid sinusitis. Other: No supplemental non-categorized findings. IMPRESSION: 1. As on 10/29/2021, there is a large left mastoid effusion associated with complete opacification of the middle ear and complete occlusion of the external auditory canal on the left, compatible with otitis media and possible mastoiditis/otitis externa. 2. Small mucous retention cyst in the left sphenoid sinus, stable. 3. Periventricular white matter and corona radiata hypodensities favor chronic ischemic microvascular white matter disease. 4. Atherosclerosis Electronically Signed   By: Van Clines M.D.   On: 11/08/2021 15:08  ? ?MR BRAIN WO  CONTRAST ? ?Result Date: 11/09/2021 ?CLINICAL DATA:  Neuro deficit with acute stroke suspected EXAM: MRI HEAD WITHOUT CONTRAST TECHNIQUE: Multiplanar, multiecho pulse sequences of the brain and surrounding structures were obtained with

## 2021-11-09 NOTE — Progress Notes (Signed)
Hemodialysis- Tolerated well. Did have tachycardia, sustaining 130s, Dr. Jonnie Finner at bedside and patient was given metoprolol with improvement. UF 0.5L today. Patient remains alert to self but place and situation occasionally. Vitals are currently at baseline. Temp 97.7.  Will call report to 5W.  ?

## 2021-11-09 NOTE — Progress Notes (Signed)
?  Transition of Care (TOC) Screening Note ? ? ?Patient Details  ?Name: Shawna [name] ?Date of Birth: [birth year] ? ? ?Transition of Care (TOC) CM/SW Contact:    ?Cyndi Bender, RN ?Phone Number: ?11/09/2021, 12:14 PM ? ? ? ?Transition of Care Department [facility]) has reviewed patient and no TOC needs have been identified at this time. We will continue to monitor patient advancement through interdisciplinary progression rounds. If new patient transition needs arise, please place a TOC consult. ? ? ?

## 2021-11-09 NOTE — Evaluation (Signed)
Clinical/Bedside Swallow Evaluation ?Patient Details  ?Name: Shawna [name] ?MRN: [id number] ?Date of Birth: 1941/03/31 ? ?Today's Date: 11/09/2021 ?Time: SLP Start Time (ACUTE ONLY): 8101 SLP Stop Time (ACUTE ONLY): 1725 ?SLP Time Calculation (min) (ACUTE ONLY): 20 min ? ?Past Medical History:  ?Past Medical History:  ?Diagnosis Date  ? Acute on chronic respiratory failure with hypoxia ([place]) 10/10/2016  ? Anxiety   ? Arthritis   ? AVM (arteriovenous malformation) of colon   ? CAD (coronary artery disease)   ? a. s/p CABG in 2013 b. DES to D1 in 10/2016. c. cath in 07/2018 showing patent grafts with occlusion of D1 at prior stent site and progression of PDA disease --> medical management recommended  ? Carotid artery disease ([place])   ? a. 75-10% LICA, 08/5850   ? Chronic anemia   ? Chronic bronchitis ([place])   ? Chronic diastolic CHF (congestive heart failure) ([place])   ? a. 02/2012 Echo EF 60-65%, nl wall motion, Gr 1 DD, mod MR  ? Colon cancer ([place]) 1992  ? Esophageal stricture   ? ESRD on hemodialysis ([place])   ? ESRD due to HTN, started dialysis 2011 and gets HD at [facility] with Dr Hinda Lenis on MWF schedule.  Access is LUA AVF as of Sept 2014.   ? GERD (gastroesophageal reflux disease)   ? High cholesterol 12/2011  ? History of blood transfusion 07/2011; 12/2011; 01/2012 X 2; 04/2012  ? History of gout   ? History of lower GI bleeding   ? Hypertension   ? Iron deficiency anemia   ? Jugular vein occlusion, right ([place])   ? Mitral regurgitation   ? a. Moderate by echo, 02/2012  ? Myocardial infarction [facility])   ? NSVT (nonsustained ventricular tachycardia) ([place])   ? Ovarian cancer ([place]) 1992  ? PAF (paroxysmal atrial fibrillation) ([place])   ? Pneumonia ~ 2009  ? PUD (peptic ulcer disease)   ? TIA (transient ischemic attack)   ? ?Past Surgical History:  ?Past Surgical History:  ?Procedure Laterality Date  ? A/V SHUNTOGRAM Left 03/19/2019  ? Procedure: A/V SHUNTOGRAM;  Surgeon: Katha Cabal, MD;  Location: [place] CV LAB;   Service: Cardiovascular;  Laterality: Left;  ? ABDOMINAL HYSTERECTOMY  1992  ? APPENDECTOMY  06/1990  ? AV FISTULA PLACEMENT  07/2009  ? left upper arm  ? AV FISTULA PLACEMENT Right 09/06/2016  ? Procedure: RIGHT FOREARM ARTERIOVENOUS (AV) GRAFT;  Surgeon: Elam Dutch, MD;  Location: [place];  Service: Vascular;  Laterality: Right;  ? AV FISTULA PLACEMENT N/A 02/24/2017  ? Procedure: INSERTION OF ARTERIOVENOUS (AV) GORE-TEX GRAFT ARM (BRACHIAL AXILLARY);  Surgeon: Katha Cabal, MD;  Location: [facility] ORS;  Service: Vascular;  Laterality: N/A;  ? [place] REMOVAL Right 09/06/2016  ? Procedure: REMOVAL OF Right Arm ARTERIOVENOUS GORETEX GRAFT and Vein Patch angioplasty of brachial artery;  Surgeon: Angelia Mould, MD;  Location: [place];  Service: Vascular;  Laterality: Right;  ? BIOPSY  09/26/2019  ? Procedure: BIOPSY;  Surgeon: Rogene Houston, MD;  [facility];  Service: Endoscopy;;  ? COLON RESECTION  1992  ? COLON SURGERY    ? COLONOSCOPY N/A 03/09/2019  ? Procedure: COLONOSCOPY;  Surgeon: Rogene Houston, MD;  [facility];  Service: Endoscopy;  Laterality: N/A;  ? COLONOSCOPY WITH PROPOFOL N/A 06/08/2021  ? Procedure: COLONOSCOPY WITH PROPOFOL;  Surgeon: Harvel Quale, MD;  [facility];  Service: Gastroenterology;  Laterality: N/A;  9:05 /Patient is on dialysis Mon Wed Fri  ? CORONARY ANGIOPLASTY WITH STENT PLACEMENT  12/15/11  ? "2"  ? CORONARY ANGIOPLASTY WITH STENT PLACEMENT  y/2013  ? "1; makes total of 3" (05/02/2012)  ? CORONARY ARTERY BYPASS GRAFT  06/13/2012  ? Procedure: CORONARY ARTERY BYPASS GRAFTING (CABG);  Surgeon: Grace Isaac, MD;  Location: [place];  Service: Open Heart Surgery;  Laterality: N/A;  cabg x four;  using left internal mammary artery, and left leg greater saphenous vein harvested endoscopically  ? CORONARY STENT INTERVENTION N/A 10/13/2016  ? Procedure: Coronary Stent Intervention;  Surgeon: Troy Sine, MD;  Location: [place]  CV LAB;  Service: Cardiovascular;  Laterality: N/A;  ? DIALYSIS/PERMA CATHETER REMOVAL N/A 04/18/2017  ? Procedure: DIALYSIS/PERMA CATHETER REMOVAL;  Surgeon: Katha Cabal, MD;  Location: [place] [facility];  Service: Cardiovascular;  Laterality: N/A;  ? DILATION AND CURETTAGE OF UTERUS    ? ENTEROSCOPY N/A 06/08/2021  ? Procedure: PUSH ENTEROSCOPY;  Surgeon: Harvel Quale, MD;  [facility];  Service: Gastroenterology;  Laterality: N/A;  ? ESOPHAGOGASTRODUODENOSCOPY  01/20/2012  ? Procedure: ESOPHAGOGASTRODUODENOSCOPY (EGD);  Surgeon: Ladene Artist, MD,FACG;  Location: [facility] ENDOSCOPY;  Service: Endoscopy;  Laterality: N/A;  ? ESOPHAGOGASTRODUODENOSCOPY N/A 03/26/2013  ? Procedure: ESOPHAGOGASTRODUODENOSCOPY (EGD);  Surgeon: Irene Shipper, MD;  Location: [facility] ENDOSCOPY;  Service: Endoscopy;  Laterality: N/A;  ? ESOPHAGOGASTRODUODENOSCOPY N/A 04/30/2015  ? Procedure: ESOPHAGOGASTRODUODENOSCOPY (EGD);  Surgeon: Rogene Houston, MD;  [facility];  Service: Endoscopy;  Laterality: N/A;  1pm - moved to 10/20 @ 1:10  ? ESOPHAGOGASTRODUODENOSCOPY N/A 07/29/2016  ? Procedure: ESOPHAGOGASTRODUODENOSCOPY (EGD);  Surgeon: Manus Gunning, MD;  Location: [place];  Service: Gastroenterology;  Laterality: N/A;  enteroscopy  ? ESOPHAGOGASTRODUODENOSCOPY N/A 09/26/2019  ? Procedure: ESOPHAGOGASTRODUODENOSCOPY (EGD);  Surgeon: Rogene Houston, MD;  [facility];  Service: Endoscopy;  Laterality: N/A;  1250  ? ESOPHAGOGASTRODUODENOSCOPY (EGD) WITH PROPOFOL N/A 02/05/2021  ? Procedure: ESOPHAGOGASTRODUODENOSCOPY (EGD) WITH PROPOFOL;  Surgeon: Eloise Harman, DO;  [facility];  Service: Endoscopy;  Laterality: N/A;  ? GIVENS CAPSULE STUDY N/A 03/07/2019  ? Procedure: GIVENS CAPSULE STUDY;  Surgeon: Rogene Houston, MD;  [facility];  Service: Endoscopy;  Laterality: N/A;  7:30  ? GIVENS CAPSULE STUDY N/A 04/22/2021  ? Procedure: GIVENS CAPSULE STUDY;   Surgeon: Rogene Houston, MD;  [facility];  Service: Endoscopy;  Laterality: N/A;  7:30  ? INSERTION OF DIALYSIS CATHETER N/A 10/05/2020  ? Procedure: ABORTED TUNNELED DIALYSIS CATHETER PLACEMENT RIGHT INTERNAL JUGULAR VEIN ;  Surgeon: Virl Cagey, MD;  [facility];  Service: General;  Laterality: N/A;  ? INTRAOPERATIVE TRANSESOPHAGEAL ECHOCARDIOGRAM  06/13/2012  ? Procedure: INTRAOPERATIVE TRANSESOPHAGEAL ECHOCARDIOGRAM;  Surgeon: Grace Isaac, MD;  Location: [place];  Service: Open Heart Surgery;  Laterality: N/A;  ? IR DIALY SHUNT INTRO NEEDLE/INTRACATH INITIAL W/IMG LEFT Left 10/06/2020  ? IR FLUORO GUIDE CV LINE RIGHT  06/17/2020  ? IR GENERIC HISTORICAL  07/26/2016  ? IR FLUORO GUIDE CV LINE RIGHT 07/26/2016 Greggory Keen, MD [facility]  ? IR GENERIC HISTORICAL  07/26/2016  ? IR US GUIDE VASC ACCESS RIGHT 07/26/2016 Greggory Keen, MD [facility]  ? IR GENERIC HISTORICAL  08/02/2016  ? IR US GUIDE VASC ACCESS RIGHT 08/02/2016 Greggory Keen, MD [facility]  ? IR GENERIC HISTORICAL  08/02/2016  ? IR FLUORO GUIDE CV LINE RIGHT 08/02/2016 Greggory Keen,  MD [facility]  ? IR RADIOLOGY PERIPHERAL GUIDED IV START  03/28/2017  ? IR REMOVAL TUN CV CATH W/O FL  08/11/2020  ? IR THROMBECTOMY AV FISTULA W/THROMBOLYSIS INC/SHUNT/IMG LEFT Left 06/17/2020  ? IR US GUIDE VASC ACCESS LEFT  06/17/2020  ? IR US GUIDE VASC ACCESS RIGHT  03/28/2017  ? IR US GUIDE VASC ACCESS RIGHT  06/17/2020  ? LEFT HEART CATH AND CORONARY ANGIOGRAPHY N/A 09/20/2016  ? Procedure: Left Heart Cath and Coronary Angiography;  Surgeon: [name], MD;  Location: [place] [facility];  Service: Cardiovascular;  Laterality: N/A;  ? LEFT HEART CATH AND CORS/GRAFTS ANGIOGRAPHY N/A 10/13/2016  ? Procedure: Left Heart Cath and Cors/Grafts Angiography;  Surgeon: Troy Sine, MD;  Location: [employer] CV LAB;  Service: Cardiovascular;  Laterality: N/A;  ? LEFT HEART CATH AND CORS/GRAFTS ANGIOGRAPHY N/A 07/13/2018  ? Procedure: LEFT HEART  CATH AND CORS/GRAFTS ANGIOGRAPHY;  Surgeon: Martinique, Peter M, MD;  Location: [place] [facility];  Service: Cardiovascular;  Laterality: N/A;  ? LEFT HEART CATH AND CORS/GRAFTS ANGIOGRAPHY N/A 07/22/2021  ? [place]ce

## 2021-11-09 NOTE — Consult Note (Signed)
Reason for Consult:left mastoiditis ?Referring Physician: Dr Avon Gully ? ?Shawna [name] is an 81 y.o. female.  ?HPI: hx of left ear pain for about 2 months. She apparently has no hx of ear disease. She had a normal ct scan of the mastoid in 2/23. She now has complete opacification of the middle ear and mastoid on recent CT scan. There was some possible erosion of the ear canal into the mastoid. She has had decreased hearing in the left. She has no vertigo. She has some drainage. She was treated about 2-3 weeks ago with ciprodex but she doesn't remember.  ? ?Past Medical History:  ?Diagnosis Date  ? Acute on chronic respiratory failure with hypoxia ([place]) 10/10/2016  ? Anxiety   ? Arthritis   ? AVM (arteriovenous malformation) of colon   ? CAD (coronary artery disease)   ? a. s/p CABG in 2013 b. DES to D1 in 10/2016. c. cath in 07/2018 showing patent grafts with occlusion of D1 at prior stent site and progression of PDA disease --> medical management recommended  ? Carotid artery disease ([place])   ? a. 52-84% LICA, 07/3242   ? Chronic anemia   ? Chronic bronchitis ([place])   ? Chronic diastolic CHF (congestive heart failure) ([place])   ? a. 02/2012 Echo EF 60-65%, nl wall motion, Gr 1 DD, mod MR  ? Colon cancer ([place]) 1992  ? Esophageal stricture   ? ESRD on hemodialysis ([place])   ? ESRD due to HTN, started dialysis 2011 and gets HD at [facility] with Dr Hinda Lenis on MWF schedule.  Access is LUA AVF as of Sept 2014.   ? GERD (gastroesophageal reflux disease)   ? High cholesterol 12/2011  ? History of blood transfusion 07/2011; 12/2011; 01/2012 X 2; 04/2012  ? History of gout   ? History of lower GI bleeding   ? Hypertension   ? Iron deficiency anemia   ? Jugular vein occlusion, right ([name])   ? Mitral regurgitation   ? a. Moderate by echo, 02/2012  ? Myocardial infarction [facility])   ? NSVT (nonsustained ventricular tachycardia) ([place])   ? Ovarian cancer ([place]) 1992  ? PAF (paroxysmal atrial fibrillation) ([place])   ? Pneumonia ~ 2009  ? PUD  (peptic ulcer disease)   ? TIA (transient ischemic attack)   ? ? ?Past Surgical History:  ?Procedure Laterality Date  ? A/V SHUNTOGRAM Left 03/19/2019  ? Procedure: A/V SHUNTOGRAM;  Surgeon: Katha Cabal, MD;  Location: [place] [facility];  Service: Cardiovascular;  Laterality: Left;  ? ABDOMINAL HYSTERECTOMY  1992  ? APPENDECTOMY  06/1990  ? AV FISTULA PLACEMENT  07/2009  ? left upper arm  ? AV FISTULA PLACEMENT Right 09/06/2016  ? Procedure: RIGHT FOREARM ARTERIOVENOUS (AV) GRAFT;  Surgeon: Elam Dutch, MD;  Location: [place];  Service: Vascular;  Laterality: Right;  ? AV FISTULA PLACEMENT N/A 02/24/2017  ? Procedure: INSERTION OF ARTERIOVENOUS (AV) GORE-TEX GRAFT ARM (BRACHIAL AXILLARY);  Surgeon: Katha Cabal, MD;  Location: [facility] OR[facility];  Service: Vascular;  Laterality: N/A;  ? [place] REMOVAL Right 09/06/2016  ? Procedure: REMOVAL OF Right Arm ARTERIOVENOUS GORETEX GRAFT and Vein Patch angioplasty of brachial artery;  Surgeon: Angelia Mould, MD;  Location: [place];  Service: Vascular;  Laterality: Right;  ? BIOPSY  09/26/2019  ? Procedure: BIOPSY;  Surgeon: Rogene Houston, MD;  [facility];  Service: Endoscopy;;  ? COLON RESECTION  1992  ? COLON SURGERY    ?  COLONOSCOPY N/A 03/09/2019  ? Procedure: COLONOSCOPY;  Surgeon: Rogene Houston, MD;  [facility];  Service: Endoscopy;  Laterality: N/A;  ? COLONOSCOPY WITH PROPOFOL N/A 06/08/2021  ? Procedure: COLONOSCOPY WITH PROPOFOL;  Surgeon: Harvel Quale, MD;  [facility];  Service: Gastroenterology;  Laterality: N/A;  9:05 /Patient is on dialysis Mon Wed Fri  ? CORONARY ANGIOPLASTY WITH STENT PLACEMENT  12/15/11  ? "2"  ? CORONARY ANGIOPLASTY WITH STENT PLACEMENT  y/2013  ? "1; makes total of 3" (05/02/2012)  ? CORONARY ARTERY BYPASS GRAFT  06/13/2012  ? Procedure: CORONARY ARTERY BYPASS GRAFTING (CABG);  Surgeon: Grace Isaac, MD;  Location: [place];  Service: Open Heart Surgery;  Laterality: N/A;  cabg  x four;  using left internal mammary artery, and left leg greater saphenous vein harvested endoscopically  ? CORONARY STENT INTERVENTION N/A 10/13/2016  ? Procedure: Coronary Stent Intervention;  Surgeon: Troy Sine, MD;  Location: [place] [facility];  Service: Cardiovascular;  Laterality: N/A;  ? DIALYSIS/PERMA CATHETER REMOVAL N/A 04/18/2017  ? Procedure: DIALYSIS/PERMA CATHETER REMOVAL;  Surgeon: Katha Cabal, MD;  Location: [place] CV LAB;  Service: Cardiovascular;  Laterality: N/A;  ? DILATION AND CURETTAGE OF UTERUS    ? ENTEROSCOPY N/A 06/08/2021  ? Procedure: PUSH ENTEROSCOPY;  Surgeon: Harvel Quale, MD;  [facility];  Service: Gastroenterology;  Laterality: N/A;  ? ESOPHAGOGASTRODUODENOSCOPY  01/20/2012  ? Procedure: ESOPHAGOGASTRODUODENOSCOPY (EGD);  Surgeon: Ladene Artist, MD,FACG;  Location: [facility] END[facility]COPY;  Service: Endoscopy;  Laterality: N/A;  ? ESOPHAGOGASTRODUODENOSCOPY N/A 03/26/2013  ? Procedure: ESOPHAGOGASTRODUODENOSCOPY (EGD);  Surgeon: Irene Shipper, MD;  Location: [facility] ENDOSCOPY;  Service: Endoscopy;  Laterality: N/A;  ? ESOPHAGOGASTRODUODENOSCOPY N/A 04/30/2015  ? Procedure: ESOPHAGOGASTRODUODENOSCOPY (EGD);  Surgeon: Rogene Houston, MD;  [facility];  Service: Endoscopy;  Laterality: N/A;  1pm - moved to 10/20 @ 1:10  ? ESOPHAGOGASTRODUODENOSCOPY N/A 07/29/2016  ? Procedure: ESOPHAGOGASTRODUODENOSCOPY (EGD);  Surgeon: Manus Gunning, MD;  Location: [place];  Service: Gastroenterology;  Laterality: N/A;  enteroscopy  ? ESOPHAGOGASTRODUODENOSCOPY N/A 09/26/2019  ? Procedure: ESOPHAGOGASTRODUODENOSCOPY (EGD);  Surgeon: Rogene Houston, MD;  [facility];  Service: Endoscopy;  Laterality: N/A;  1250  ? ESOPHAGOGASTRODUODENOSCOPY (EGD) WITH PROPOFOL N/A 02/05/2021  ? Procedure: ESOPHAGOGASTRODUODENOSCOPY (EGD) WITH PROPOFOL;  Surgeon: Eloise Harman, DO;  [facility];  Service: Endoscopy;  Laterality: N/A;  ?  GIVENS CAPSULE STUDY N/A 03/07/2019  ? Procedure: GIVENS CAPSULE STUDY;  Surgeon: Rogene Houston, MD;  [facility];  Service: Endoscopy;  Laterality: N/A;  7:30  ? GIVENS CAPSULE STUDY N/A 04/22/2021  ? Procedure: GIVENS CAPSULE STUDY;  Surgeon: Rogene Houston, MD;  [facility];  Service: Endoscopy;  Laterality: N/A;  7:30  ? INSERTION OF DIALYSIS CATHETER N/A 10/05/2020  ? Procedure: ABORTED TUNNELED DIALYSIS CATHETER PLACEMENT RIGHT INTERNAL JUGULAR VEIN ;  Surgeon: Virl Cagey, MD;  [facility];  Service: General;  Laterality: N/A;  ? INTRAOPERATIVE TRANSESOPHAGEAL ECHOCARDIOGRAM  06/13/2012  ? Procedure: INTRAOPERATIVE TRANSESOPHAGEAL ECHOCARDIOGRAM;  Surgeon: Grace Isaac, MD;  Location: [place];  Service: Open Heart Surgery;  Laterality: N/A;  ? IR DIALY SHUNT INTRO NEEDLE/INTRACATH INITIAL W/IMG LEFT Left 10/06/2020  ? IR FLUORO GUIDE CV LINE RIGHT  06/17/2020  ? IR GENERIC HISTORICAL  07/26/2016  ? IR FLUORO GUIDE CV LINE RIGHT 07/26/2016 Greggory Keen, MD [facility]  ? IR GENERIC HISTORICAL  07/26/2016  ? IR US GUIDE VASC ACCESS RIGHT 07/26/2016 Greggory Keen, MD [facility]  ? IR GENERIC HISTORICAL  08/02/2016  ? IR US GUIDE VASC ACCESS RIGHT 08/02/2016 Greggory Keen, MD [facility]  ? IR GENERIC HISTORICAL  08/02/2016  ? IR FLUORO GUIDE CV LINE RIGHT 08/02/2016 Greggory Keen, MD [facility]  ? IR RADIOLOGY PERIPHERAL GUIDED IV START  03/28/2017  ? IR REMOVAL TUN CV CATH W/O FL  08/11/2020  ? IR THROMBECTOMY AV FISTULA W/THROMBOLYSIS INC/SHUNT/IMG LEFT Left 06/17/2020  ? IR US GUIDE VASC ACCESS LEFT  06/17/2020  ? IR US GUIDE VASC ACCESS RIGHT  03/28/2017  ? IR US GUIDE VASC ACCESS RIGHT  06/17/2020  ? LEFT HEART CATH AND CORONARY ANGIOGRAPHY N/A 09/20/2016  ? Procedure: Left Heart Cath and Coronary Angiography;  Surgeon: [name], MD;  Location: [place] [facility] LAB;  Service: Cardiovascular;  Laterality: N/A;  ? LEFT HEART CATH AND CORS/GRAFTS ANGIOGRAPHY N/A 10/13/2016  ?  Procedure: Left Heart Cath and Cors/Grafts Angiography;  Surgeon: Troy Sine, MD;  Location: [place] [facility] LAB;  Service: Cardiovascular;  Laterality: N/A;  ? LEFT HEART CATH AND CORS/GRAFTS ANGIOGRAPHY

## 2021-11-09 NOTE — Progress Notes (Signed)
?  Transition of Care (TOC) Screening Note ? ? ?Patient Details  ?Name: Shawna [name] ?Date of Birth: Oct 14, 1940 ? ? ?Transition of Care (TOC) CM/SW Contact:    ?Cyndi Bender, RN ?Phone Number: ?11/09/2021, 12:13 PM ? ? ? ?Transition of Care Department [facility]) has reviewed patient and no TOC needs have been identified at this time. We will continue to monitor patient advancement through interdisciplinary progression rounds. If new patient transition needs arise, please place a TOC consult. ? ? ?

## 2021-11-10 DIAGNOSIS — D631 Anemia in chronic kidney disease: Secondary | ICD-10-CM

## 2021-11-10 DIAGNOSIS — N189 Chronic kidney disease, unspecified: Secondary | ICD-10-CM

## 2021-11-10 DIAGNOSIS — I10 Essential (primary) hypertension: Secondary | ICD-10-CM

## 2021-11-10 DIAGNOSIS — G9340 Encephalopathy, unspecified: Secondary | ICD-10-CM | POA: Diagnosis not present

## 2021-11-10 DIAGNOSIS — N186 End stage renal disease: Secondary | ICD-10-CM | POA: Diagnosis not present

## 2021-11-10 DIAGNOSIS — H6092 Unspecified otitis externa, left ear: Secondary | ICD-10-CM

## 2021-11-10 DIAGNOSIS — H70002 Acute mastoiditis without complications, left ear: Secondary | ICD-10-CM

## 2021-11-10 LAB — BASIC METABOLIC PANEL
Anion gap: 17 — ABNORMAL HIGH (ref 5–15)
BUN: 32 mg/dL — ABNORMAL HIGH (ref 8–23)
CO2: 23 mmol/L (ref 22–32)
Calcium: 8.4 mg/dL — ABNORMAL LOW (ref 8.9–10.3)
Chloride: 99 mmol/L (ref 98–111)
Creatinine, Ser: 5.91 mg/dL — ABNORMAL HIGH (ref 0.44–1.00)
GFR, Estimated: 7 mL/min — ABNORMAL LOW (ref >60)
Glucose, Bld: 107 mg/dL — ABNORMAL HIGH (ref 70–99)
Potassium: 3.2 mmol/L — ABNORMAL LOW (ref 3.5–5.1)
Sodium: 139 mmol/L (ref 135–145)

## 2021-11-10 LAB — PREPARE RBC (CROSSMATCH)

## 2021-11-10 LAB — CBC
HCT: 19.3 % — ABNORMAL LOW (ref 36.0–46.0)
Hemoglobin: 6.3 g/dL — CL (ref 12.0–15.0)
MCH: 34.8 pg — ABNORMAL HIGH (ref 26.0–34.0)
MCHC: 32.6 g/dL (ref 30.0–36.0)
MCV: 106.6 fL — ABNORMAL HIGH (ref 80.0–100.0)
Platelets: UNDETERMINED 10*3/uL (ref 150–400)
RBC: 1.81 MIL/uL — ABNORMAL LOW (ref 3.87–5.11)
RDW: 20.7 % — ABNORMAL HIGH (ref 11.5–15.5)
WBC: 7.7 10*3/uL (ref 4.0–10.5)
nRBC: 0 % (ref 0.0–0.2)

## 2021-11-10 LAB — HEPATITIS B SURFACE ANTIBODY, QUANTITATIVE: Hep B S AB Quant (Post): >1000 m[IU]/mL (ref >9.9)

## 2021-11-10 MED ORDER — DEXTROSE 5 % IV SOLN
500.0000 mg | Freq: Once | INTRAVENOUS | Status: AC
  Administered 2021-11-10: 500 mg via INTRAVENOUS
  Filled 2021-11-10: qty 5

## 2021-11-10 MED ORDER — SODIUM CHLORIDE 0.9 % IV SOLN
1.0000 g | Freq: Every day | INTRAVENOUS | Status: DC
  Filled 2021-11-10: qty 10

## 2021-11-10 MED ORDER — SODIUM CHLORIDE 0.9 % IV SOLN
1.0000 g | Freq: Once | INTRAVENOUS | Status: AC
  Administered 2021-11-10: 1 g via INTRAVENOUS
  Filled 2021-11-10: qty 10

## 2021-11-10 MED ORDER — POTASSIUM CHLORIDE 10 MEQ/100ML IV SOLN: 10.0000 meq | INTRAVENOUS | Status: DC

## 2021-11-10 MED ORDER — MIDODRINE HCL 5 MG PO TABS
5.0000 mg | ORAL_TABLET | Freq: Once | ORAL | Status: DC
  Filled 2021-11-10: qty 1

## 2021-11-10 MED ORDER — POTASSIUM CHLORIDE CRYS ER 20 MEQ PO TBCR
30.0000 meq | EXTENDED_RELEASE_TABLET | Freq: Once | ORAL | Status: DC
  Filled 2021-11-10: qty 1

## 2021-11-10 MED ORDER — SODIUM CHLORIDE 0.9 % IV SOLN
1.0000 g | Freq: Every day | INTRAVENOUS | Status: DC
  Administered 2021-11-11 – 2021-11-13 (×3): 1 g via INTRAVENOUS
  Filled 2021-11-10 (×4): qty 10

## 2021-11-10 MED ORDER — SODIUM CHLORIDE 0.9% IV SOLUTION: Freq: Once | INTRAVENOUS | Status: AC

## 2021-11-10 MED ORDER — METOPROLOL TARTRATE 5 MG/5ML IV SOLN
2.5000 mg | Freq: Once | INTRAVENOUS | Status: AC
  Administered 2021-11-10: 2.5 mg via INTRAVENOUS
  Filled 2021-11-10: qty 5

## 2021-11-10 MED ORDER — HEPARIN SODIUM (PORCINE) 1000 UNIT/ML DIALYSIS
1500.0000 [IU] | INTRAMUSCULAR | Status: AC | PRN
  Administered 2021-11-15 – 2021-11-17 (×2): 1500 [IU] via INTRAVENOUS_CENTRAL
  Filled 2021-11-10 (×2): qty 2

## 2021-11-10 NOTE — Progress Notes (Addendum)
?PROGRESS NOTE ? ? ? ?Shawna [name]  MAU:[id number] DOB: November 07, 1940 DOA: 11/08/2021 ?PCP: [facility] ? ? ?Brief Narrative:  ? ?Shawna [name] is a 81 y.o. female with history of paroxysmal atrial fibrillation not on anticoagulation secondary to history of GI bleed, CAD status post CABG and stent placement, ESRD on hemodialysis on Monday Wednesday Friday, prior history of colon cancer, hyperlipidemia was brought to the ER after patient was found to be increasingly confused and weak over the last 24 hours.   ? ?Of note approximately 10 days prior to admission patient presented to the ED with left earache with external otitis media and mastoiditis placed on oral antibiotics at that time.  ? ?Assessment & Plan: ?  ?Principal Problem: ?  Acute encephalopathy ?Active Problems: ?  Hx of CABG ?  Essential hypertension ?  History of colon cancer ?  History of ovarian cancer ?  ESRD (end stage renal disease) on dialysis [facility]) ?  PAF (paroxysmal atrial fibrillation) ([place]) ?  Acute metabolic encephalopathy ? ? ?Acute metabolic encephalopathy, POA ?-Multifactorial ?- possibly related to polypharmacy, continue to hold gabapentin, alprazolam and oxycodone ?-Continue to treat infectious etiology with subacute mass triaditis versus otitis media ?-As well possibly related to uremia for which she was dialyzed ?-No acute finding on MRI brain ? ? ?Subacute mastoiditis versus otitis media, questionably ongoing  ?-Previously treated outpatient with improving symptoms per patient and family (no longer having ear pain) ?-ENT input greatly appreciated, she has a wick placed, and started on TobraDex, will change IV Rocephin to IV cefepime to cover for Pseudomonas and staph as discussed with ENT today, at this point no need to cover for MRSA given she is MRSA negative last month.  Follow on cultures and adjust antibiotics if needed. ?-  [name]  can come out in 3 to 4 days, no need for surgical intervention for now . ? ?ESRD Monday  Wednesday Friday ?-Missed dialysis Monday. ?- HD per renal  ? ?History of paroxysmal atrial fibrillation not on anticoagulation secondary to GI bleed  ?-Currently rate controlled on amiodarone. ? ?History of CAD status post CABG  ?-continue home statin  ? ?Anemia of chronic kidney disease  ?- Hemoglobin is low at 6.3 today, will transfuse units PRBC, EPO, IV iron per renal.  . ? ?Hypokalemia ?-Management with HD ? ?Hypertension ?-Blood pressure well controlled currently ? ?History of colon cancer and ovarian cancer. -Completed previous therapy ?   ?DVT prophylaxis: SCDs for now ?Code Status: Full code. ?Family Communication: None present ? ?Status is: Inpatient ? ?Dispo: The patient is from: Home ?             Anticipated d/c is to: To be determined ?             Anticipated d/c date is: 48 to 72 hours ?             Patient currently not medically stable for discharge ? ?Consultants:  ?Nephrology, ENT ? ?Procedures:  ?None ? ?Antimicrobials:  ?Ceftriaxone ? ?Subjective: ?No acute events as discussed with staff overnight, she denies any complaints today ? ?Objective: ?Vitals:  ? 11/10/21 0000 11/10/21 0200 11/10/21 0400 11/10/21 0800  ?BP: (!) 104/51 (!) 99/33 (!) 117/48 (!) 107/33  ?Pulse: (!) 126 70 72 69  ?Resp: (!) '22 17 15 '$ (!) 21  ?Temp:   99 ?F (37.2 ?C)   ?TempSrc:   Oral   ?SpO2: 96% 97% 99% 96%  ?Weight:      ?  Height:      ? ? ?Intake/Output Summary (Last 24 hours) at 11/10/2021 0853 ?Last data filed at 11/09/2021 1800 ?Gross per 24 hour  ?Intake 100 ml  ?Output 522 ml  ?Net -422 ml  ? ? ?Filed Weights  ? 11/09/21 1133  ?Weight: 56.2 kg  ? ? ?Examination: ? ?Awake Alert, he is more appropriate, coherent and conversant today. ?Symmetrical Chest wall movement, Good air movement bilaterally, CTAB ?RRR,No Gallops,Rubs or new Murmurs, No Parasternal Heave ?+ve B.Sounds, Abd Soft, No tenderness, No rebound - guarding or rigidity. ?No Cyanosis, Clubbing or edema, No new Rash or bruise   ? ? ?Data Reviewed: I have  personally reviewed following labs and imaging studies ? ?CBC: ?Recent Labs  ?Lab 11/08/21 ?1800 11/08/21 ?2230 11/08/21 ?2242 11/09/21 ?0109 11/10/21 ?0455  ?WBC 13.8* 12.3*  --  12.1* 7.7  ?HGB 7.8* 7.2* 7.5* 7.0* 6.3*  ?HCT 25.1* 22.9* 22.0* 21.8* 19.3*  ?MCV 110.6* 110.1*  --  109.5* 106.6*  ?PLT 128* 130*  --  PLATELET CLUMPS NOTED ON SMEAR, UNABLE TO ESTIMATE PLATELET CLUMPS NOTED ON SMEAR, UNABLE TO ESTIMATE  ? ? ?Basic Metabolic Panel: ?Recent Labs  ?Lab 11/08/21 ?1800 11/08/21 ?2230 11/08/21 ?2242 11/09/21 ?0420 11/10/21 ?0120  ?NA 133*  --  132* 133* 139  ?K 4.3  --  3.9 4.0 3.2*  ?CL 98  --   --  97* 99  ?CO2 20*  --   --  20* 23  ?GLUCOSE 129*  --   --  110* 107*  ?BUN 52*  --   --  61* 32*  ?CREATININE 9.72* 10.33*  --  10.90* 5.91*  ?CALCIUM 9.1  --   --  9.0 8.4*  ?MG 1.9  --   --   --   --   ? ? ?GFR: ?Estimated Creatinine Clearance: 5.6 mL/min (A) (by C-G formula based on SCr of 5.91 mg/dL (H)). ?Liver Function Tests: ?No results for input(s): AST, ALT, ALKPHOS, BILITOT, PROT, ALBUMIN in the last 168 hours. ?No results for input(s): LIPASE, AMYLASE in the last 168 hours. ?Recent Labs  ?Lab 11/08/21 ?1800  ?AMMONIA 33  ? ? ?Coagulation Profile: ?Recent Labs  ?Lab 11/08/21 ?1800  ?INR 1.3*  ? ? ?Cardiac Enzymes: ?No results for input(s): CKTOTAL, CKMB, CKMBINDEX, TROPONINI in the last 168 hours. ?BNP (last 3 results) ?No results for input(s): PROBNP in the last 8760 hours. ?HbA1C: ?No results for input(s): HGBA1C in the last 72 hours. ?CBG: ?Recent Labs  ?Lab 11/08/21 ?1657  ?GLUCAP 103*  ? ? ?Lipid Profile: ?No results for input(s): CHOL, HDL, LDLCALC, TRIG, CHOLHDL, LDLDIRECT in the last 72 hours. ?Thyroid Function Tests: ?No results for input(s): TSH, T4TOTAL, FREET4, T3FREE, THYROIDAB in the last 72 hours. ?Anemia Panel: ?Recent Labs  ?  11/08/21 ?1800  ?VITAMINB12 319  ?FOLATE >40.0  ? ? ?Sepsis Labs: ?No results for input(s): PROCALCITON, LATICACIDVEN in the last 168 hours. ? ?Recent Results  (from the past 240 hour(s))  ?Blood culture (routine x 2)     Status: None (Preliminary result)  ? Collection Time: 11/08/21  6:00 PM  ? Specimen: BLOOD  ?Result Value Ref Range Status  ? Specimen Description BLOOD  Final  ? Special Requests   Final  ?  BOTTLES DRAWN AEROBIC AND ANAEROBIC Blood Culture results may not be optimal due to an inadequate volume of blood received in culture bottles RT EJ  ? Culture   Final  ?  NO GROWTH 2 DAYS ?Performed at  [facility], [place] [address], [place], [place] [address] ?  ? Report Status PENDING  Incomplete  ?  ? ? ? ? ? ?Radiology Studies: ?CT Head Wo Contrast ? ?Result Date: 11/08/2021 ?CLINICAL DATA:  Acute neurologic deficit EXAM: CT HEAD WITHOUT CONTRAST TECHNIQUE: Contiguous axial images were obtained from the base of the skull through the vertex without intravenous contrast. RADIATION DOSE REDUCTION: This exam was performed according to the departmental dose-optimization program which includes automated exposure control, adjustment of the mA and/or kV according to patient size and/or use of iterative reconstruction technique. COMPARISON:  Multiple exams, including 08/30/2021 FINDINGS: Brain: Periventricular white matter and corona radiata hypodensities favor chronic ischemic microvascular white matter disease. Otherwise, the brainstem, cerebellum, cerebral peduncles, thalamus, basal ganglia, basilar cisterns, and ventricular system appear within normal limits. No intracranial hemorrhage, mass lesion, or acute CVA. Vascular: There is atherosclerotic calcification of the cavernous carotid arteries bilaterally. Skull: Unremarkable Sinuses/Orbits: Continued complete left mastoid effusion with fluid density filling the middle ear and the external auditory canal on the left. The appearance is compatible with otitis media and entities such as otitis externa and mastoiditis are not excluded. Osseous erosion along the posterior osseous portion of the external auditory canal  is again suggested on image 8 series 3. Chronic left sphenoid sinusitis. Other: No supplemental non-categorized findings. IMPRESSION: 1. As on 10/29/2021, there is a large left mastoid effusion associated with com

## 2021-11-10 NOTE — Progress Notes (Signed)
Shawna [name] ?Progress Note ? ?Subjective: seen in room.  ? ?Vitals:  ? 11/09/21 2054 11/09/21 2228 11/09/21 2318 11/10/21 0400  ?BP: (!) 106/51 (!) 122/50 (!) 103/46 (!) 117/48  ?Pulse: (!) 124 (!) 126 (!) 123 72  ?Resp: '15 14 19 15  '$ ?Temp: 99.6 ?F (37.6 ?C)  99.4 ?F (37.4 ?C) 99 ?F (37.2 ?C)  ?TempSrc: Oral  Oral Oral  ?SpO2: 97% 96% 97% 99%  ?Weight:      ?Height:      ? ? ?Exam: ?GEN NAD, lying in bed ?HEENT EOMI PERRL ?NECK R EJ IV in place, no overt JVD ?PULM clear ?CV RRR ?ABD soft ?EXT trace LE edema ?NEURO alert, no fully oriented ?SKIN warm and well- perfused ?ACCESS: L AVF + T/B ?  ?OP HD: [facility] MWF ?  3.5h 56.5kg   BFR 300   2/2.5 bath LUA AVF  Heparin none ? - venofer 50 mg q Monday ? - sensipar 30 mg tiw ? - calcitriol 1.75 ug po tiw ? - mircera 200 ug q 2wks, last 4/25 ?- 5/01 lab showed HBsAg neg w/ Abs > 10/ protective ? ? ?Assessment/Plan: ?AMS/ gen'd weakness: multifactorial- sepsis +/- CVA (MRI ordered), polypharmacy. Has good HD compliance.  Getting antibiotics and MRI per primary.   ?ESRD: normally MWF. Missed OP HD Monday. Had HD here yesterday. Were planning HD again today but having afib/ RVR again this am so we held off. Plan for HD later tonight.  ?BP/vol: BP's controlled on the low side, not on BP lowering meds. Gets midodrine pre HD tiw. Euvolemic on exam.  ?Anemia of ESRD:  hgb 7.8 here, last esa was high-dose on 4/25. Next due due 5/09. Otherwise would transfuse prn.  ?Metabolic Bone Disease: binder and vitamin when eating ?Atrial fib - w/ RVR again today, per pmd ?Hypokalemia - K+ 3.2 today w/ afib and RVR. Ordered po Kdur 30 meq x 1 ?Hx CAD/ sp CABG - on statin ?L ear mastoiditis: seen on CT temporal bones 4/21 and again on CT here 11/08/21.  Getting IV antibiotics and ENT consulting.  ?Hx of colon and ovarian cancer ? ? ? ? ?Shawna [name] ?11/10/2021, 7:52 AM ? ? ?Recent Labs  ?Lab 11/09/21 ?0420 11/10/21 ?0120 11/10/21 ?6283  ?HGB 7.0*  --  6.3*  ?CALCIUM 9.0 8.4*  --    ?CREATININE 10.90* 5.91*  --   ?K 4.0 3.2*  --   ? ? ?Inpatient medications: ? sodium chloride   Intravenous Once  ? amiodarone  100 mg Oral Daily  ? Chlorhexidine Gluconate Cloth  6 each Topical Q0600  ? levothyroxine  25 mcg Oral Q0600  ? midodrine  5-10 mg Oral Q M,W,F-HD  ? multivitamin  1 tablet Oral Daily  ? rosuvastatin  10 mg Oral Daily  ? sevelamer carbonate  2,400 mg Oral TID WC  ? tobramycin-dexamethasone  2 drop Left EAR Q6H  ? ? sodium chloride    ? sodium chloride    ? cefTRIAXone (ROCEPHIN)  IV Stopped (11/09/21 1800)  ? ?sodium chloride, sodium chloride, alteplase, heparin, lidocaine (PF), lidocaine-prilocaine, pentafluoroprop-tetrafluoroeth ? ? ? ? ? ? ?

## 2021-11-10 NOTE — Progress Notes (Signed)
Pharmacy Antibiotic Note ? ?Shawna [name] is a 81 y.o. female admitted on 11/08/2021 with  mastoiditis  .  Pharmacy has been consulted for Cefepime dosing. ? ?Plan: ?Cefepime 1 gram iv qday s/p HD on HD days ? ?Height: '5\' 1"'$  (154.9 cm) ?Weight: 56.2 kg (123 lb 14.4 oz) ?IBW/kg (Calculated) : 47.8 ? ?Temp (24hrs), Avg:98.8 ?F (37.1 ?C), Min:97.7 ?F (36.5 ?C), Max:99.6 ?F (37.6 ?C) ? ?Recent Labs  ?Lab 11/08/21 ?1800 11/08/21 ?2230 11/09/21 ?0420 11/10/21 ?0120 11/10/21 ?0455  ?WBC 13.8* 12.3* 12.1*  --  7.7  ?CREATININE 9.72* 10.33* 10.90* 5.91*  --   ?  ?Estimated Creatinine Clearance: 5.6 mL/min (A) (by C-G formula based on SCr of 5.91 mg/dL (H)).   ? ?Allergies  ?Allergen Reactions  ? Amlodipine Swelling  ? Aspirin Other (See Comments)  ?  High Doses Mess up her stomach; "makes my bowels have blood in them". ?Takes 81 mg EC Aspirin   ? Nitrofurantoin Hives  ? Penicillins Other (See Comments)  ?  SYNCOPE? , "makes me real weak when I take it; like I'll pass out" ? ?Has patient had a PCN reaction causing immediate rash, facial/tongue/throat swelling, SOB or lightheadedness with hypotension: Yes ?Has patient had a PCN reaction causing severe rash involving mucus membranes or skin necrosis: no ?Has patient had a PCN reaction that required hospitalization no ?Has patient had a PCN reaction occurring within the last 10 years: no ?If all of the above ?  ? Bactrim [Sulfamethoxazole-Trimethoprim] Rash  ? Contrast Media [Iodinated Contrast Media] Itching  ? Iron Itching and Other (See Comments)  ?  "they gave me iron in dialysis; had to give me Benadryl cause I had to have the iron" (05/02/2012)  ? Tylenol [Acetaminophen] Itching and Other (See Comments)  ?  Makes her feet on fire per pt  ? Gabapentin Other (See Comments)  ?  Unknown reaction  ? Iron Sucrose Other (See Comments)  ?  Unknown  ? Ranexa [Ranolazine] Other (See Comments)  ?  Myoclonus-hospitalized   ? Sucroferric Oxyhydroxide Other (See Comments)  ?  Unknown  ?  Dexilant [Dexlansoprazole] Other (See Comments)  ?  Upset stomach  ? Hydralazine Itching  ?  Has tolerated while inpatient ?Has tolerated while inpatient  ? Levaquin [Levofloxacin In D5w] Rash  ? Morphine And Related Itching and Other (See Comments)  ?  Itching in feet  ? Pantoprazole Rash  ? Plavix [Clopidogrel Bisulfate] Rash  ? Protonix [Pantoprazole Sodium] Rash  ? Venofer [Ferric Oxide] Itching and Other (See Comments)  ?  Patient reports using Benadryl prior to doses as [place]  ? ? ?Antimicrobials this admission: ?CTX 5/1 >> 5/2 ?Cefepime 5/3 >>  ? ?Dose adjustments this admission: ?Adjusted cefepime dosing for ESRD HD dependant and coverage for pseudomonas given infection (mastoiditis) and typical bacteria recovered (staph and pseudomonas). Patient is MRSA PCR negative ? ?Microbiology results: ?10/19/2021 MRSA PCR: negative ? ?Thank you for allowing pharmacy to be a part of this patient?s care. ? ?Larene Ascencio BS, PharmD, BCPS ?Clinical Pharmacist ?11/10/2021 9:14 AM ? ?Contact: [phone] after 3 PM ? ?"Be curious, not judgmental..." -Jamal Maes ? ?

## 2021-11-10 NOTE — Progress Notes (Signed)
SLP Cancellation Note ? ?Patient Details ?Name: Shawna [name] ?MRN: [id number] ?DOB: 06-29-1941 ? ? ?Cancelled treatment:       Reason Eval/Treat Not Completed: Patient declined, no reason specified. Patient in a lot of pain from blood transfusion. SLP spoke with RN who reports she and medical team all aware of this. She also reported patient did eat some breakfast but it does not appear that PO intake is adequate. SLP will attempt f/u next date. ? ? ?Sonia Baller, MA, CCC-SLP ?Speech Therapy ? ?

## 2021-11-10 NOTE — Progress Notes (Signed)
Patient down for dialysis.  

## 2021-11-11 DIAGNOSIS — I480 Paroxysmal atrial fibrillation: Secondary | ICD-10-CM

## 2021-11-11 DIAGNOSIS — G9340 Encephalopathy, unspecified: Secondary | ICD-10-CM | POA: Diagnosis not present

## 2021-11-11 DIAGNOSIS — I10 Essential (primary) hypertension: Secondary | ICD-10-CM | POA: Diagnosis not present

## 2021-11-11 DIAGNOSIS — N186 End stage renal disease: Secondary | ICD-10-CM | POA: Diagnosis not present

## 2021-11-11 DIAGNOSIS — H6092 Unspecified otitis externa, left ear: Secondary | ICD-10-CM | POA: Diagnosis not present

## 2021-11-11 LAB — CBC
HCT: 29.2 % — ABNORMAL LOW (ref 36.0–46.0)
Hemoglobin: 9.7 g/dL — ABNORMAL LOW (ref 12.0–15.0)
MCH: 32.8 pg (ref 26.0–34.0)
MCHC: 33.2 g/dL (ref 30.0–36.0)
MCV: 98.6 fL (ref 80.0–100.0)
Platelets: 160 10*3/uL (ref 150–400)
RBC: 2.96 MIL/uL — ABNORMAL LOW (ref 3.87–5.11)
RDW: 21.2 % — ABNORMAL HIGH (ref 11.5–15.5)
WBC: 8.7 10*3/uL (ref 4.0–10.5)
nRBC: 0 % (ref 0.0–0.2)

## 2021-11-11 LAB — BASIC METABOLIC PANEL
Anion gap: 16 — ABNORMAL HIGH (ref 5–15)
BUN: 51 mg/dL — ABNORMAL HIGH (ref 8–23)
CO2: 23 mmol/L (ref 22–32)
Calcium: 8.6 mg/dL — ABNORMAL LOW (ref 8.9–10.3)
Chloride: 101 mmol/L (ref 98–111)
Creatinine, Ser: 8.21 mg/dL — ABNORMAL HIGH (ref 0.44–1.00)
GFR, Estimated: 5 mL/min — ABNORMAL LOW (ref >60)
Glucose, Bld: 110 mg/dL — ABNORMAL HIGH (ref 70–99)
Potassium: 3.3 mmol/L — ABNORMAL LOW (ref 3.5–5.1)
Sodium: 140 mmol/L (ref 135–145)

## 2021-11-11 LAB — EAR CULTURE

## 2021-11-11 MED ORDER — HEPARIN SODIUM (PORCINE) 5000 UNIT/ML IJ SOLN
5000.0000 [IU] | Freq: Three times a day (TID) | INTRAMUSCULAR | Status: DC
  Administered 2021-11-11 – 2021-11-24 (×30): 5000 [IU] via SUBCUTANEOUS
  Filled 2021-11-11 (×30): qty 1

## 2021-11-11 MED ORDER — HYDROXYZINE HCL 25 MG PO TABS
25.0000 mg | ORAL_TABLET | Freq: Three times a day (TID) | ORAL | Status: DC | PRN
  Administered 2021-11-14: 25 mg via ORAL
  Filled 2021-11-11 (×2): qty 1

## 2021-11-11 NOTE — Progress Notes (Signed)
CSW received SNF consult. Attempted to contact patient's spouse at both listed numbers but no voicemails available. Family not in room. Will continue attempts.  ? ?Cedric Fishman ?LCSW, MSW, MHA ? ?

## 2021-11-11 NOTE — Procedures (Signed)
Patient refusing hemodialysis treatment stating she wants to dialyze tomorrow instead. Patient refusing to allow assessment of dialysis access or to insert dialysis needles to perform dialysis treatment. Primary nurse and Dr. Corliss Marcus, nephrologist, aware.  ?

## 2021-11-11 NOTE — Evaluation (Signed)
Physical Therapy Evaluation ?Patient Details ?Name: Shawna [name] ?MRN: [id number] ?DOB: Feb 28, 1941 ?Today's Date: 11/11/2021 ? ?History of Present Illness ? Pt is an 81 year old woman admitted on 11/09/21 with AMS and fever with symptoms of mastoiditis and otitis media. Brain MRI negative for acute changes Hospital course complicated by anemia, requiring transfusion. PMH: CHF, ESRD, GERD, HLD, HTN, chronic anemia, PAF, CAD s/p CABG.  ?Clinical Impression ?  ?Pt admitted secondary to problem above with deficits below. PTA patient was living with spouse and, per medical record, was walking with rollator. Patient remains confused and family not present during assessment.  Pt currently requires 2 person moderate assistance to stand at EOB and is unable to ambulate.  Anticipate patient will benefit from PT to address problems listed below.Will continue to follow acutely to maximize functional mobility independence and safety.   ?   ?   ? ?Recommendations for follow up therapy are one component of a multi-disciplinary discharge planning process, led by the attending physician.  Recommendations may be updated based on patient status, additional functional criteria and insurance authorization. ? ?Follow Up Recommendations Skilled nursing-short term rehab (<3 hours/day) ? ?  ?Assistance Recommended at Discharge Frequent or constant Supervision/Assistance  ?Patient can return home with the following ? Two people to help with walking and/or transfers;Two people to help with bathing/dressing/bathroom;Assistance with cooking/housework;Assistance with feeding;Direct supervision/assist for medications management;Direct supervision/assist for financial management;Assist for transportation;Help with stairs or ramp for entrance ? ?  ?Equipment Recommendations None recommended by PT  ?Recommendations for Other Services ?    ?  ?Functional Status Assessment Patient has had a recent decline in their functional status and demonstrates the  ability to make significant improvements in function in a reasonable and predictable amount of time.  ? ?  ?Precautions / Restrictions Precautions ?Precautions: Fall ?Restrictions ?Weight Bearing Restrictions: No  ? ?  ? ?Mobility ? Bed Mobility ?Overal bed mobility: Needs Assistance ?Bed Mobility: Supine to Sit ?  ?  ?Supine to sit: HOB elevated, Min assist ?  ?  ?General bed mobility comments: increased time, multimodal cues to initiate ?  ? ?Transfers ?Overall transfer level: Needs assistance ?Equipment used: 2 person hand held assist ?Transfers: Sit to/from Stand ?Sit to Stand: +2 physical assistance, Mod assist ?  ?  ?  ?  ?  ?General transfer comment: assist to rise and steady, pt unable to take steps ?  ? ?Ambulation/Gait ?  ?  ?  ?  ?  ?  ?  ?  ? ?Stairs ?  ?  ?  ?  ?  ? ?Wheelchair Mobility ?  ? ?Modified Rankin (Stroke Patients Only) ?  ? ?  ? ?Balance Overall balance assessment: Needs assistance ?Sitting-balance support: Feet supported ?Sitting balance-Leahy Scale: Fair ?  ?  ?Standing balance support: Bilateral upper extremity supported ?Standing balance-Leahy Scale: Zero ?Standing balance comment: pt with flexed posture, stabilizing LEs on bed, posterior lean ?  ?  ?  ?  ?  ?  ?  ?  ?  ?  ?  ?   ? ? ? ?Pertinent Vitals/Pain Pain Assessment ?Pain Assessment: Faces ?Faces Pain Scale: Hurts little more ?Pain Location: L hand ?Pain Descriptors / Indicators: Grimacing, Guarding ?Pain Intervention(s): Monitored during session, Limited activity within patient's tolerance  ? ? ?Home Living Family/patient expects to be discharged to:: Private residence ?Living Arrangements: Spouse/significant other ?Available Help at Discharge: Family;Available 24 hours/day ?Type of Home: House ?Home Access: Stairs to enter ?  ?  ?  ?  Home Layout: One level ?Home Equipment: Rollator (4 wheels);Cane - single point ?Additional Comments: Information from previous admission as unsure of accuracy from pt.  ?  ?Prior Function Prior  Level of Function : Needs assist;Independent/Modified Independent;Patient poor historian/Family not available ?  ?  ?  ?  ?  ?  ?Mobility Comments: Patient states uses rollator or SPC PRN; spouse assists with stairs ?ADLs Comments: independent ?  ? ? ?Hand Dominance  ? Dominant Hand: Right ? ?  ?Extremity/Trunk Assessment  ? Upper Extremity Assessment ?Upper Extremity Assessment: Defer to OT evaluation ?  ? ?Lower Extremity Assessment ?Lower Extremity Assessment: Generalized weakness ?  ? ?Cervical / Trunk Assessment ?Cervical / Trunk Assessment: Kyphotic  ?Communication  ? Communication: No difficulties  ?Cognition Arousal/Alertness: Awake/alert ?Behavior During Therapy: Flat affect ?Overall Cognitive Status: Impaired/Different from baseline ?Area of Impairment: Orientation, Attention, Memory, Following commands, Safety/judgement, Awareness, Problem solving ?  ?  ?  ?  ?  ?  ?  ?  ?Orientation Level: Disoriented to, Place, Time, Situation ?Current Attention Level: Focused ?Memory: Decreased short-term memory ?Following Commands: Follows one step commands inconsistently ?Safety/Judgement: Decreased awareness of safety, Decreased awareness of deficits ?Awareness: Intellectual ?Problem Solving: Slow processing, Decreased initiation, Difficulty sequencing, Requires verbal cues, Requires tactile cues ?  ?  ?  ? ?  ?General Comments   ? ?  ?Exercises    ? ?Assessment/Plan  ?  ?PT Assessment Patient needs continued PT services  ?PT Problem List Decreased strength;Decreased balance;Decreased mobility;Decreased cognition;Decreased knowledge of use of DME;Decreased safety awareness;Decreased knowledge of precautions ? ?   ?  ?PT Treatment Interventions DME instruction;Gait training;Functional mobility training;Therapeutic activities;Therapeutic exercise;Balance training;Cognitive remediation;Patient/family education   ? ?PT Goals (Current goals can be found in the Care Plan section)  ?Acute Rehab PT Goals ?Patient Stated  Goal: uanble to state ?PT Goal Formulation: Patient unable to participate in goal setting ?Time For Goal Achievement: 11/25/21 ?Potential to Achieve Goals: Fair ? ?  ?Frequency Min 2X/week ?  ? ? ?Co-evaluation PT/OT/SLP Co-Evaluation/Treatment: Yes ?Reason for Co-Treatment: Necessary to address cognition/behavior during functional activity;For patient/therapist safety ?PT goals addressed during session: Mobility/safety with mobility;Balance ?OT goals addressed during session: ADL's and self-care;Strengthening/ROM ?  ? ? ?  ?AM-PAC PT "6 Clicks" Mobility  ?Outcome Measure Help needed turning from your back to your side while in a flat bed without using bedrails?: A Little ?Help needed moving from lying on your back to sitting on the side of a flat bed without using bedrails?: A Little ?Help needed moving to and from a bed to a chair (including a wheelchair)?: Total ?Help needed standing up from a chair using your arms (e.g., wheelchair or bedside chair)?: Total ?Help needed to walk in hospital room?: Total ?Help needed climbing 3-5 steps with a railing? : Total ?6 Click Score: 10 ? ?  ?End of Session   ?Activity Tolerance: Patient tolerated treatment well ?Patient left: in bed;with call bell/phone within reach;with bed alarm set;with nursing/sitter in room ?Nurse Communication: Mobility status ?PT Visit Diagnosis: Other abnormalities of gait and mobility (R26.89);Muscle weakness (generalized) (M62.81) ?  ? ?Time: [phone] ?PT Time Calculation (min) (ACUTE ONLY): 23 min ? ? ?Charges:   PT Evaluation ?$PT Eval Low Complexity: 1 Low ?  ?  ?   ? ? ? ?Arby Barrette, PT ?Acute Rehabilitation Services  ?Pager [phone] ?Office [phone] ? ? ?Jeanie Cooks Jennye Runquist ?11/11/2021, 10:46 AM ? ?

## 2021-11-11 NOTE — Progress Notes (Signed)
Subjective: ?No issues overnight. Reports improvement in her left ear pain. ? ?Objective: ?Vital signs in last 24 hours: ?Temp:  [97.9 ?F (36.6 ?C)-98.6 ?F (37 ?C)] 97.9 ?F (36.6 ?C) (05/04 0515) ?Pulse Rate:  [67-108] 84 (05/04 1200) ?Resp:  [11-22] 11 (05/04 1200) ?BP: (92-134)/(35-86) 99/80 (05/04 1200) ?SpO2:  [92 %-99 %] 92 % (05/04 1200) ? ?Physical Exam: ?General: Communicates without difficulty, slightly confused.  ?Head: Normocephalic, no evidence injury, no tenderness, facial buttresses intact without stepoff.  ?Face/sinus: No tenderness to palpation and percussion. Facial movement is normal and symmetric.  ?Eyes: PERRL, EOMI. No scleral icterus, conjunctivae clear.  ?Ears: Auricles well formed without lesions.  [place] and polypoid tissue noted in the left ear canal. No active drainage The right ear canal, tympanic membrane, and middle ear space are normal.  ?Nose: External evaluation reveals normal support and skin without lesions.  Dorsum is intact.  Anterior rhinoscopy reveals pink mucosa over anterior aspect of inferior turbinates and intact septum.  No purulence noted.  ?Oral:  Oral cavity and oropharynx are intact, symmetric, without erythema or edema.  Mucosa is moist without lesions.  ?Neck: Full range of motion without pain.  There is no significant lymphadenopathy.  No masses palpable.  Trachea is midline.  ? ? ?Recent Labs  ?  11/10/21 ?0455 11/11/21 ?0701  ?WBC 7.7 8.7  ?HGB 6.3* 9.7*  ?HCT 19.3* 29.2*  ?PLT PLATELET CLUMPS NOTED ON SMEAR, UNABLE TO ESTIMATE 160  ? ?Recent Labs  ?  11/10/21 ?0120 11/11/21 ?0701  ?NA 139 140  ?K 3.2* 3.3*  ?CL 99 101  ?CO2 23 23  ?GLUCOSE 107* 110*  ?BUN 32* 51*  ?CREATININE 5.91* 8.21*  ?CALCIUM 8.4* 8.6*  ? ? ?Medications: I have reviewed the patient's current medications. ?Scheduled: ? amiodarone  100 mg Oral Daily  ? Chlorhexidine Gluconate Cloth  6 each Topical Q0600  ? heparin injection (subcutaneous)  5,000 Units Subcutaneous Q8H  ? levothyroxine  25 mcg  Oral Q0600  ? midodrine  5 mg Oral Once  ? midodrine  5-10 mg Oral Q M,W,F-HD  ? multivitamin  1 tablet Oral Daily  ? potassium chloride  30 mEq Oral Once  ? rosuvastatin  10 mg Oral Daily  ? sevelamer carbonate  2,400 mg Oral TID WC  ? tobramycin-dexamethasone  2 drop Left EAR Q6H  ? ?Continuous: ? sodium chloride    ? sodium chloride    ? ceFEPime (MAXIPIME) IV    ? ? ?Assessment/Plan: ?History of left otitis externa with left ear canal polyp. CT and MRI reviewed. ?- Possible malignant otitis externa. ?- No clinical evidence of mastoiditis. No mastoid tenderness or edema. ?- Continue ciprodex eardrops 4 drops BID for 10 additional days. ?- Agree with 3 weeks of antibiotic coverage. ?- No need to remove the otowick.  It will self dislodge once the ear canal edema subsides. ?- Patient may follow up with me in my office in 1 week. ? ? LOS: 2 days  ? ?Shawna [name] ?11/11/2021, 12:37 PM  ?

## 2021-11-11 NOTE — Progress Notes (Signed)
Speech Language Pathology Treatment: Dysphagia  ?Patient Details ?Name: Shawna [name] ?MRN: [id number] ?DOB: 1941/01/09 ?Today's Date: 11/11/2021 ?Time: [phone] ?SLP Time Calculation (min) (ACUTE ONLY): 10 min ? ?Assessment / Plan / Recommendation ?Clinical Impression ? Pt seen to assess po tolerance and readiness for advancement.  She was awake in bed,slid down in bed upon SLP entrance to room stating she is "going to go to the house".  SLP repositioned pt in bed and provided toothbrush/paste for her - which she declined to use with total assist - placing toothbrush in hand.  Pt kept her eyes closed most of the time - and when asked if she would allow SLP to assess oral cavity, she stated "I'm not gonna let you look in my mouth".  Pt did consume few boluses of water via straw.  No indication of aspiration or dysphagia with po intake.  SLP will sign off at this time as pt not accepting po intake during sessions and all vitals remain stable. Her most recent CXR showed  Increased central vascular congestion without overt edema. ? ?  ?HPI HPI: Patient is an 81 y.o. female with PMH: afib, h/o GI bleed, CAD s/p CABG, ESRD on HD (MWF), prior h/o colon cancer, HLD. She was brought to ER after she was found to be increasingly confused and weak over period of 24 hours. In ER temperature was 99 degrees F, CT head showed features concerning for mastoiditis and otitis media. MRI was negative for any acute intrcranial abnormalities. ?  ?   ?SLP Plan ? Other (Comment);Discharge SLP treatment due to (comment) (pt with decreased participation) ? ?  ?  ?Recommendations for follow up therapy are one component of a multi-disciplinary discharge planning process, led by the attending physician.  Recommendations may be updated based on patient status, additional functional criteria and insurance authorization. ?  ? ?Recommendations  ?Diet recommendations: Regular as tolerated   change to mechanical soft if needed ;Thin liquid ?Liquids  provided via: Straw ?Medication Administration: Whole meds with liquid ?Supervision: Patient able to self feed ?Compensations: Slow rate;Small sips/bites;Minimize environmental distractions ?Postural Changes and/or Swallow Maneuvers: Seated upright 90 degrees;Upright 30-60 min after meal  ?   ?    ?   ? ? ? ? Oral Care Recommendations: Oral care BID ?Follow Up Recommendations: No SLP follow up ?Assistance recommended at discharge: None ?SLP Visit Diagnosis: Dysphagia, unspecified (R13.10) ?Plan: Other (Comment);Discharge SLP treatment due to (comment) (pt with decreased participation) ? ? ? ? ?  ?  ? ?Kathleen Lime, MS CCC SLP ?Acute Rehab Services ?Office [phone] ?Pager [phone] ? ?Shawna [name] ? ?11/11/2021, 9:11 AM ?

## 2021-11-11 NOTE — Progress Notes (Signed)
Patient ID: Shawna [name], female   DOB: 05-14-1941, 81 y.o.   MRN: [id number] ?She I now found out she is Dr. Deeann Saint patient as he has seen her in his office recently.  I have talked to him and he will come see her today for completion of her inpatient care and then outpatient follow-up. ?

## 2021-11-11 NOTE — Progress Notes (Addendum)
[facility] ?Progress Note ? ?Subjective: refused HD last night, said she will go during the day today. Had 2u prbc's yesterday.  ? ?Vitals:  ? 11/11/21 0400 11/11/21 0425 11/11/21 0515 11/11/21 0800  ?BP: 1'07/86 97/67 92/66 '$ (!) 111/56  ?Pulse: (!) 108 99 95 (!) 101  ?Resp: (!) '22 18  20  '$ ?Temp:  98.3 ?F (36.8 ?C) 97.9 ?F (36.6 ?C)   ?TempSrc:  Oral Oral   ?SpO2: 96%   96%  ?Weight:      ?Height:      ? ? ?Exam: ? alert, nad  ? no jvd ? Chest cta bilat ? Cor reg no RG ? Abd soft ntnd no ascites ?  Ext no LE edema ?  Alert, NF, ox3 ?    L AVF + T/B ?  ?OP HD: Shawna [name] MWF ?  3.5h 56.5kg   BFR 300   2/2.5 bath LUA AVF  Heparin none ? - venofer 50 mg q Monday ? - sensipar 30 mg tiw ? - calcitriol 1.75 ug po tiw ? - mircera 200 ug q 2wks, last 4/25 ?- 5/01 lab showed HBsAg neg w/ Abs > 10/ protective ? ? ?Assessment/Plan: ?AMS/ gen'd weakness: multifactorial- sepsis + polypharmacy. Has good HD compliance.  Getting antibiotics and holding gabapentin, oxycodone and xanax. Still confused.  ?L ear mastoiditis: seen on CT temporal bones 4/21 and again on CT here 11/08/21.  Getting IV antibiotics and ENT consulting.  ?Atrial fib - w/ off and on RVR , per pmd ?ESRD: normally MWF. Missed OP HD Monday. Had HD here Tuesday. Refused HD last night, will plan HD today off schedule.  ?Hypokalemia - high K+ bath w/ HD today ?BP/vol: BP's controlled, at dry wt, euvolemic on exam. Not on any BP lowering meds. Gets midodrine pre HD tiw. ?Anemia of ESRD: last esa was high-dose OP on 4/25. Next due due 5/09. Transfuse prn. Sp 2u prbc's 5/03.  ?Metabolic Bone Disease: binder and vitamin when eating ?Hx CAD/ sp CABG - on statin ?Hx of colon and ovarian cancer ? ? ? ? ?Shawna [name] ?11/11/2021, 8:13 AM ? ? ?Recent Labs  ?Lab 11/09/21 ?0420 11/10/21 ?0120 11/10/21 ?4496  ?HGB 7.0*  --  6.3*  ?CALCIUM 9.0 8.4*  --   ?CREATININE 10.90* 5.91*  --   ?K 4.0 3.2*  --   ? ? ?Inpatient medications: ? amiodarone  100 mg Oral Daily  ?  Chlorhexidine Gluconate Cloth  6 each Topical Q0600  ? levothyroxine  25 mcg Oral Q0600  ? midodrine  5 mg Oral Once  ? midodrine  5-10 mg Oral Q M,W,F-HD  ? multivitamin  1 tablet Oral Daily  ? potassium chloride  30 mEq Oral Once  ? rosuvastatin  10 mg Oral Daily  ? sevelamer carbonate  2,400 mg Oral TID WC  ? tobramycin-dexamethasone  2 drop Left EAR Q6H  ? ? sodium chloride    ? sodium chloride    ? ceFEPime (MAXIPIME) IV    ? ?sodium chloride, sodium chloride, alteplase, heparin, heparin, lidocaine (PF), lidocaine-prilocaine, pentafluoroprop-tetrafluoroeth ? ? ? ? ? ? ?

## 2021-11-11 NOTE — Procedures (Signed)
Arrived to hemodialysis with BP of 106/51.  Attempted to remove 3L in the beginning, however she became hypotensive within the first 30 min of treatment.  UF held to allow for BP to increase.  BP easily rebounded to 105/70 within 30 min.  UF goal then decreased to 500 mls net and unfortunately she once again became hypotensive.  UF was once again turned off in order to allow for refill.  After 1 hour she was once again able to tolerate more fluid removal, as such UF was once again turned on and she had no other issues with fluid removal. Final BP was 140/64 UF removal 354 mls ?

## 2021-11-11 NOTE — Progress Notes (Signed)
?PROGRESS NOTE ? ? ? ?Shawna [name]  ZJI:[id number] DOB: 1940/09/27 DOA: 11/08/2021 ?PCP: [facility] ? ? ?Brief Narrative:  ? ?Shawna [name] is a 81 y.o. female with history of paroxysmal atrial fibrillation not on anticoagulation secondary to history of GI bleed, CAD status post CABG and stent placement, ESRD on hemodialysis on Monday Wednesday Friday, prior history of colon cancer, hyperlipidemia was brought to the ER after patient was found to be increasingly confused and weak over the last 24 hours.   ? ?Of note approximately 10 days prior to admission patient presented to the ED with left earache with external otitis media and mastoiditis placed on oral antibiotics at that time.  ? ?Assessment & Plan: ?  ?Principal Problem: ?  Acute encephalopathy ?Active Problems: ?  Hx of CABG ?  Essential hypertension ?  History of colon cancer ?  History of ovarian cancer ?  ESRD (end stage renal disease) on dialysis [facility]) ?  PAF (paroxysmal atrial fibrillation) ([place]) ?  Acute metabolic encephalopathy ? ? ?Acute metabolic encephalopathy, POA ?Hospital delirium ?-Multifactorial ?- possibly related to polypharmacy, continue to hold gabapentin, alprazolam and oxycodone ?-Continue to treat infectious etiology with subacute mastoiditis versus otitis media ?-As well possibly related to uremia for which she was dialyzed ?-No acute finding on MRI brain, but she is with extensive chronic small vessel ischemia, generalized brain atrophy which indicates some underlining vascular dementia. ? ? ?Subacute mastoiditis versus otitis media, questionably ongoing  ?-Previously treated outpatient with improving symptoms per patient and family (no longer having ear pain) ?-ENT input greatly appreciated, she has a wick placed, and started on TobraDex, initially on IV Rocephin, changed to IV cefepime, urine cultures growing Pseudomonas, on IV cefepime for now, timing per ENT.   ?-  [name]  can come out in 3 to 4 days, no need for  surgical intervention for now . ? ?ESRD Monday Wednesday Friday ?-Missed dialysis Monday. ?- HD per renal  ? ?History of paroxysmal atrial fibrillation not on anticoagulation secondary to GI bleed  ?-Currently rate controlled on amiodarone. ? ?History of CAD status post CABG  ?-continue home statin  ? ?Anemia of chronic kidney disease  ?- EPO, IV iron per renal.   ?-Received 2 units PRBC 5/3 for hemoglobin of 6.3, good response with hemoglobin of 9.6 today. ? ?Hypokalemia ?-Management with HD ? ?Hypertension ?-Blood pressure well controlled currently ? ?History of colon cancer and ovarian cancer. -Completed previous therapy ?   ?DVT prophylaxis: Subcu heparin ?Code Status: Full code. ?Family Communication: None at bedside, discussed with daughter yesterday and husband today by phone. ? ?Status is: Inpatient ? ?Dispo: The patient is from: Home ?             Anticipated d/c is to: To be determined ?             Anticipated d/c date is: 48 to 72 hours ?             Patient currently not medically stable for discharge ? ?Consultants:  ?Nephrology, ENT ? ?Procedures:  ?None ? ? ?Subjective: ?No acute events overnight, patient remains intermittently confused, she refused HD yesterday, this morning she denies any nausea, vomiting, no significant events as discussed with staff. ? ?Objective: ?Vitals:  ? 11/11/21 0400 11/11/21 0425 11/11/21 0515 11/11/21 0800  ?BP: 1'07/86 97/67 92/66 '$ (!) 111/56  ?Pulse: (!) 108 99 95 (!) 101  ?Resp: (!) '22 18  20  '$ ?Temp:  98.3 ?F (36.8 ?C)  97.9 ?F (36.6 ?C)   ?TempSrc:  Oral Oral   ?SpO2: 96%   96%  ?Weight:      ?Height:      ? ? ?Intake/Output Summary (Last 24 hours) at 11/11/2021 1042 ?Last data filed at 11/11/2021 0515 ?Gross per 24 hour  ?Intake 777.84 ml  ?Output --  ?Net 777.84 ml  ? ? ?Filed Weights  ? 11/09/21 1133  ?Weight: 56.2 kg  ? ? ?Examination: ? ?Awake, frail, chronically ill-appearing, confused.   ?Symmetrical Chest wall movement, Good air movement bilaterally, CTAB ?RRR,No  Gallops,Rubs or new Murmurs, No Parasternal Heave ?+ve B.Sounds, Abd Soft, No tenderness, No rebound - guarding or rigidity. ?No Cyanosis, Clubbing or edema, No new Rash or bruise   ? ? ? ?Data Reviewed: I have personally reviewed following labs and imaging studies ? ?CBC: ?Recent Labs  ?Lab 11/08/21 ?1800 11/08/21 ?2230 11/08/21 ?2242 11/09/21 ?6720 11/10/21 ?9470 11/11/21 ?0701  ?WBC 13.8* 12.3*  --  12.1* 7.7 8.7  ?HGB 7.8* 7.2* 7.5* 7.0* 6.3* 9.7*  ?HCT 25.1* 22.9* 22.0* 21.8* 19.3* 29.2*  ?MCV 110.6* 110.1*  --  109.5* 106.6* 98.6  ?PLT 128* 130*  --  PLATELET CLUMPS NOTED ON SMEAR, UNABLE TO ESTIMATE PLATELET CLUMPS NOTED ON SMEAR, UNABLE TO ESTIMATE 160  ? ? ?Basic Metabolic Panel: ?Recent Labs  ?Lab 11/08/21 ?1800 11/08/21 ?2230 11/08/21 ?2242 11/09/21 ?0420 11/10/21 ?0120 11/11/21 ?0701  ?NA 133*  --  132* 133* 139 140  ?K 4.3  --  3.9 4.0 3.2* 3.3*  ?CL 98  --   --  97* 99 101  ?CO2 20*  --   --  20* 23 23  ?GLUCOSE 129*  --   --  110* 107* 110*  ?BUN 52*  --   --  61* 32* 51*  ?CREATININE 9.72* 10.33*  --  10.90* 5.91* 8.21*  ?CALCIUM 9.1  --   --  9.0 8.4* 8.6*  ?MG 1.9  --   --   --   --   --   ? ? ?GFR: ?Estimated Creatinine Clearance: 4.1 mL/min (A) (by C-G formula based on SCr of 8.21 mg/dL (H)). ?Liver Function Tests: ?No results for input(s): AST, ALT, ALKPHOS, BILITOT, PROT, ALBUMIN in the last 168 hours. ?No results for input(s): LIPASE, AMYLASE in the last 168 hours. ?Recent Labs  ?Lab 11/08/21 ?1800  ?AMMONIA 33  ? ? ?Coagulation Profile: ?Recent Labs  ?Lab 11/08/21 ?1800  ?INR 1.3*  ? ? ?Cardiac Enzymes: ?No results for input(s): CKTOTAL, CKMB, CKMBINDEX, TROPONINI in the last 168 hours. ?BNP (last 3 results) ?No results for input(s): PROBNP in the last 8760 hours. ?HbA1C: ?No results for input(s): HGBA1C in the last 72 hours. ?CBG: ?Recent Labs  ?Lab 11/08/21 ?1657  ?GLUCAP 103*  ? ? ?Lipid Profile: ?No results for input(s): CHOL, HDL, LDLCALC, TRIG, CHOLHDL, LDLDIRECT in the last 72  hours. ?Thyroid Function Tests: ?No results for input(s): TSH, T4TOTAL, FREET4, T3FREE, THYROIDAB in the last 72 hours. ?Anemia Panel: ?Recent Labs  ?  11/08/21 ?1800  ?VITAMINB12 319  ?FOLATE >40.0  ? ? ?Sepsis Labs: ?No results for input(s): PROCALCITON, LATICACIDVEN in the last 168 hours. ? ?Recent Results (from the past 240 hour(s))  ?Blood culture (routine x 2)     Status: None (Preliminary result)  ? Collection Time: 11/08/21  6:00 PM  ? Specimen: BLOOD  ?Result Value Ref Range Status  ? Specimen Description BLOOD  Final  ? Special Requests   Final  ?  BOTTLES DRAWN AEROBIC AND ANAEROBIC Blood Culture results may not be optimal due to an inadequate volume of blood received in culture bottles RT EJ  ? Culture   Final  ?  NO GROWTH 3 DAYS ?Performed at [facility], [place] [address] [place], [place] [address] ?  ? Report Status PENDING  Incomplete  ?Ear culture     Status: None  ? Collection Time: 11/09/21  3:30 PM  ? Specimen: Ear; Other  ?Result Value Ref Range Status  ? Specimen Description EAR  Final  ? Special Requests   Final  ?  LEFT EAR ?Performed at [facility], [place] [address], [place],  [address] ?  ? Culture RARE PSEUDOMONAS AERUGINOSA  Final  ? Report Status 11/11/2021 FINAL  Final  ? Organism ID, Bacteria PSEUDOMONAS AERUGINOSA  Final  ?    Susceptibility  ? Pseudomonas aeruginosa - MIC*  ?  CEFTAZIDIME 2 SENSITIVE Sensitive   ?  CIPROFLOXACIN <=0.25 SENSITIVE Sensitive   ?  GENTAMICIN <=1 SENSITIVE Sensitive   ?  IMIPENEM >=16 RESISTANT Resistant   ?  PIP/TAZO <=4 SENSITIVE Sensitive   ?  CEFEPIME 2 SENSITIVE Sensitive   ?  * RARE PSEUDOMONAS AERUGINOSA  ?  ? ? ? ? ? ?Radiology Studies: ?No results found. ? ? ? ? ? ?Scheduled Meds: ? amiodarone  100 mg Oral Daily  ? Chlorhexidine Gluconate Cloth  6 each Topical Q0600  ? heparin injection (subcutaneous)  5,000 Units Subcutaneous Q8H  ? levothyroxine  25 mcg Oral Q0600  ? midodrine  5 mg Oral Once  ? midodrine  5-10 mg Oral Q  M,W,F-HD  ? multivitamin  1 tablet Oral Daily  ? potassium chloride  30 mEq Oral Once  ? rosuvastatin  10 mg Oral Daily  ? sevelamer carbonate  2,400 mg Oral TID WC  ? tobramycin-dexamethasone  2 drop Left EAR Q6H  ? ?Cornelius Moras

## 2021-11-11 NOTE — Evaluation (Signed)
Occupational Therapy Evaluation ?Patient Details ?Name: Shawna [name] ?MRN: [id number] ?DOB: [birth year] ?Today's Date: 11/11/2021 ? ? ?History of Present Illness Pt is an 81 year old woman admitted on 11/09/21 with AMS and fever with symptoms of mastoiditis and otitis media. Brain MRI negative for acute changes Hospital course complicated by anemia, requiring transfusion. PMH: CHF, ESRD, GERD, HLD, HTN, chronic anemia, PAF, CAD s/p CABG.  ? ?Clinical Impression ?  ?Per previous chart entry, pt was living with her husband, walking with a rollator and independent in self care. Pt presents with significant cognitive impairment, generalized weakness and impaired standing balance. Pt is dependent in all ADLs and requires +2 assist to stand, but is unable to take steps. Recommending SNF for further rehab.  ?   ? ?Recommendations for follow up therapy are one component of a multi-disciplinary discharge planning process, led by the attending physician.  Recommendations may be updated based on patient status, additional functional criteria and insurance authorization.  ? ?Follow Up Recommendations ? Skilled nursing-short term rehab (<3 hours/day)  ?  ?Assistance Recommended at Discharge Frequent or constant Supervision/Assistance  ?Patient can return home with the following Two people to help with walking and/or transfers;A lot of help with bathing/dressing/bathroom;Direct supervision/assist for medications management;Assistance with feeding;Assistance with cooking/housework;Direct supervision/assist for financial management;Assist for transportation;Help with stairs or ramp for entrance ? ?  ?Functional Status Assessment ? Patient has had a recent decline in their functional status and demonstrates the ability to make significant improvements in function in a reasonable and predictable amount of time.  ?Equipment Recommendations ? Other (comment) (defer to next venue)  ?  ?Recommendations for Other Services   ? ? ?  ?Precautions /  Restrictions Precautions ?Precautions: Fall ?Restrictions ?Weight Bearing Restrictions: No  ? ?  ? ?Mobility Bed Mobility ?Overal bed mobility: Needs Assistance ?Bed Mobility: Supine to Sit ?  ?  ?Supine to sit: HOB elevated, Min assist ?  ?  ?General bed mobility comments: increased time, multimodal cues to initiate ?  ? ?Transfers ?Overall transfer level: Needs assistance ?Equipment used: 2 person hand held assist ?Transfers: Sit to/from Stand ?Sit to Stand: +2 physical assistance, Mod assist ?  ?  ?  ?  ?  ?General transfer comment: assist to rise and steady, pt unable to take steps ?  ? ?  ?Balance Overall balance assessment: Needs assistance ?Sitting-balance support: Feet supported ?Sitting balance-Leahy Scale: Fair ?  ?  ?Standing balance support: Bilateral upper extremity supported ?Standing balance-Leahy Scale: Zero ?Standing balance comment: pt with flexed posture, stabilizing LEs on bed ?  ?  ?  ?  ?  ?  ?  ?  ?  ?  ?  ?   ? ?ADL either performed or assessed with clinical judgement  ? ?ADL   ?  ?  ?  ?  ?  ?  ?  ?  ?  ?  ?  ?  ?  ?  ?  ?  ?  ?  ?  ?General ADL Comments: total assist  ? ? ? ?Vision Ability to See in Adequate Light: 0 Adequate ?Patient Visual Report: No change from baseline ?   ?   ?Perception   ?  ?Praxis   ?  ? ?Pertinent Vitals/Pain Pain Assessment ?Pain Assessment: Faces ?Faces Pain Scale: Hurts little more ?Pain Location: L hand ?Pain Descriptors / Indicators: Grimacing, Guarding ?Pain Intervention(s): Monitored during session, Repositioned  ? ? ? ?Hand Dominance Right ?  ?Extremity/Trunk Assessment Upper  Extremity Assessment ?Upper Extremity Assessment: Generalized weakness (painful L hand) ?  ?Lower Extremity Assessment ?Lower Extremity Assessment: Defer to PT evaluation ?  ?Cervical / Trunk Assessment ?Cervical / Trunk Assessment: Kyphotic ?  ?Communication Communication ?Communication: No difficulties ?  ?Cognition Arousal/Alertness: Awake/alert ?Behavior During Therapy: Flat  affect ?Overall Cognitive Status: Impaired/Different from baseline ?Area of Impairment: Orientation, Attention, Memory, Following commands, Safety/judgement, Awareness, Problem solving ?  ?  ?  ?  ?  ?  ?  ?  ?Orientation Level: Disoriented to, Place, Time, Situation ?Current Attention Level: Focused ?Memory: Decreased short-term memory ?Following Commands: Follows one step commands inconsistently ?Safety/Judgement: Decreased awareness of safety, Decreased awareness of deficits ?Awareness: Intellectual ?Problem Solving: Slow processing, Decreased initiation, Difficulty sequencing, Requires verbal cues, Requires tactile cues ?  ?  ?  ?General Comments    ? ?  ?Exercises   ?  ?Shoulder Instructions    ? ? ?Home Living Family/patient expects to be discharged to:: Private residence ?Living Arrangements: Spouse/significant other ?Available Help at Discharge: Family;Available 24 hours/day ?Type of Home: House ?Home Access: Stairs to enter ?  ?  ?Home Layout: One level ?  ?  ?Bathroom Shower/Tub: Tub/shower unit ?  ?Bathroom Toilet: Standard ?  ?  ?Home Equipment: Rollator (4 wheels);Cane - single point ?  ?Additional Comments: Information from previous admission as unsure of accuracy from pt. ?  ? ?  ?Prior Functioning/Environment Prior Level of Function : Needs assist;Independent/Modified Independent ?  ?  ?  ?  ?  ?  ?Mobility Comments: Patient states uses rollator or SPC PRN; spouse assists with stairs ?ADLs Comments: independent ?  ? ?  ?  ?OT Problem List: Decreased strength;Decreased activity tolerance;Impaired balance (sitting and/or standing);Decreased coordination;Decreased cognition;Decreased safety awareness;Decreased knowledge of use of DME or AE;Impaired UE functional use;Pain ?  ?   ?OT Treatment/Interventions: Self-care/ADL training;DME and/or AE instruction;Cognitive remediation/compensation;Therapeutic activities;Patient/family education;Balance training  ?  ?OT Goals(Current goals can be found in the  care plan section) Acute Rehab OT Goals ?OT Goal Formulation: Patient unable to participate in goal setting ?Time For Goal Achievement: 03/27/22 ?Potential to Achieve Goals: Fair ?ADL Goals ?Pt Will Perform Eating: with min assist;sitting ?Pt Will Perform Grooming: with min assist;sitting ?Pt Will Perform Upper Body Bathing: with mod assist;sitting ?Pt Will Perform Upper Body Dressing: with min assist;sitting ?Pt Will Transfer to Toilet: with mod assist;stand pivot transfer;bedside commode ?Additional ADL Goal #1: Pt will follow one step commands with 75% accuracy. ?Additional ADL Goal #2: Pt will perform bed mobility with supervision in preparation for ADLs.  ?OT Frequency: Min 2X/week ?  ? ?Co-evaluation PT/OT/SLP Co-Evaluation/Treatment: Yes ?Reason for Co-Treatment: For patient/therapist safety ?  ?OT goals addressed during session: ADL's and self-care;Strengthening/ROM ?  ? ?  ?AM-PAC OT "6 Clicks" Daily Activity     ?Outcome Measure Help from another person eating meals?: None ?Help from another person taking care of personal grooming?: None ?Help from another person toileting, which includes using toliet, bedpan, or urinal?: None ?Help from another person bathing (including washing, rinsing, drying)?: None ?Help from another person to put on and taking off regular upper body clothing?: None ?Help from another person to put on and taking off regular lower body clothing?: None ?6 Click Score: 24 ?  ?End of Session Nurse Communication: Mobility status ? ?Activity Tolerance: Patient tolerated treatment well ?Patient left: in bed;with call bell/phone within reach;with bed alarm set ? ?OT Visit Diagnosis: Unsteadiness on feet (R26.81);Muscle weakness (generalized) (M62.81);Pain;Other symptoms and signs involving cognitive  function ?Pain - Right/Left: Left ?Pain - part of body: Hand  ?              ?Time: 3810-1751 ?OT Time Calculation (min): 25 min ?Charges:  OT General Charges ?$OT Visit: 1 Visit ?OT  Evaluation ?$OT Eval Moderate Complexity: 1 Mod ? ?Nestor Lewandowsky, OTR/L ?Acute Rehabilitation Services ?Pager: [phone] ?Office: [phone]  ?Malka So ?11/11/2021, 10:23 AM ?

## 2021-11-12 ENCOUNTER — Telehealth (INDEPENDENT_AMBULATORY_CARE_PROVIDER_SITE_OTHER): Payer: Self-pay

## 2021-11-12 DIAGNOSIS — H6022 Malignant otitis externa, left ear: Secondary | ICD-10-CM | POA: Diagnosis not present

## 2021-11-12 DIAGNOSIS — G9340 Encephalopathy, unspecified: Secondary | ICD-10-CM | POA: Diagnosis not present

## 2021-11-12 DIAGNOSIS — N186 End stage renal disease: Secondary | ICD-10-CM | POA: Diagnosis not present

## 2021-11-12 DIAGNOSIS — I5022 Chronic systolic (congestive) heart failure: Secondary | ICD-10-CM | POA: Diagnosis not present

## 2021-11-12 DIAGNOSIS — I480 Paroxysmal atrial fibrillation: Secondary | ICD-10-CM | POA: Diagnosis not present

## 2021-11-12 LAB — CBC
HCT: 31.2 % — ABNORMAL LOW (ref 36.0–46.0)
Hemoglobin: 10.4 g/dL — ABNORMAL LOW (ref 12.0–15.0)
MCH: 32.8 pg (ref 26.0–34.0)
MCHC: 33.3 g/dL (ref 30.0–36.0)
MCV: 98.4 fL (ref 80.0–100.0)
Platelets: UNDETERMINED 10*3/uL (ref 150–400)
RBC: 3.17 MIL/uL — ABNORMAL LOW (ref 3.87–5.11)
RDW: 20.9 % — ABNORMAL HIGH (ref 11.5–15.5)
WBC: 7.3 10*3/uL (ref 4.0–10.5)
nRBC: 0 % (ref 0.0–0.2)

## 2021-11-12 LAB — BPAM RBC
Blood Product Expiration Date: 202306052359
Blood Product Expiration Date: 202306082359
ISSUE DATE / TIME: 202305031509
ISSUE DATE / TIME: 202305040152
Unit Type and Rh: 9500
Unit Type and Rh: 9500

## 2021-11-12 LAB — TYPE AND SCREEN
ABO/RH(D): O POS
Antibody Screen: NEGATIVE
Donor AG Type: NEGATIVE
Donor AG Type: NEGATIVE
Unit division: 0
Unit division: 0

## 2021-11-12 LAB — BASIC METABOLIC PANEL
Anion gap: 14 (ref 5–15)
BUN: 17 mg/dL (ref 8–23)
CO2: 23 mmol/L (ref 22–32)
Calcium: 9.2 mg/dL (ref 8.9–10.3)
Chloride: 104 mmol/L (ref 98–111)
Creatinine, Ser: 4.23 mg/dL — ABNORMAL HIGH (ref 0.44–1.00)
GFR, Estimated: 10 mL/min — ABNORMAL LOW (ref >60)
Glucose, Bld: 82 mg/dL (ref 70–99)
Potassium: 4.3 mmol/L (ref 3.5–5.1)
Sodium: 141 mmol/L (ref 135–145)

## 2021-11-12 LAB — GLUCOSE, CAPILLARY: Glucose-Capillary: 83 mg/dL (ref 70–99)

## 2021-11-12 MED ORDER — METOPROLOL TARTRATE 5 MG/5ML IV SOLN
2.5000 mg | Freq: Once | INTRAVENOUS | Status: AC
  Administered 2021-11-12: 2.5 mg via INTRAVENOUS
  Filled 2021-11-12: qty 5

## 2021-11-12 MED ORDER — ALPRAZOLAM 0.5 MG PO TABS: 0.5000 mg | ORAL_TABLET | Freq: Every day | ORAL | Status: DC

## 2021-11-12 MED ORDER — AMIODARONE HCL IN DEXTROSE 360-4.14 MG/200ML-% IV SOLN
30.0000 mg/h | INTRAVENOUS | Status: DC
  Administered 2021-11-12 – 2021-11-14 (×4): 30 mg/h via INTRAVENOUS
  Filled 2021-11-12 (×3): qty 200

## 2021-11-12 MED ORDER — HYDROMORPHONE HCL 1 MG/ML IJ SOLN
0.5000 mg | Freq: Once | INTRAMUSCULAR | Status: AC
  Administered 2021-11-12: 0.5 mg via INTRAVENOUS
  Filled 2021-11-12: qty 0.5

## 2021-11-12 MED ORDER — METOPROLOL TARTRATE 5 MG/5ML IV SOLN
5.0000 mg | Freq: Three times a day (TID) | INTRAVENOUS | Status: DC
  Administered 2021-11-12 – 2021-11-15 (×8): 5 mg via INTRAVENOUS
  Filled 2021-11-12 (×8): qty 5

## 2021-11-12 MED ORDER — AMIODARONE LOAD VIA INFUSION
150.0000 mg | Freq: Once | INTRAVENOUS | Status: AC
  Administered 2021-11-12: 150 mg via INTRAVENOUS
  Filled 2021-11-12: qty 83.34

## 2021-11-12 MED ORDER — SODIUM CHLORIDE 0.9 % IV SOLN
INTRAVENOUS | Status: DC | PRN
Start: 2021-11-12 — End: 2021-11-25

## 2021-11-12 MED ORDER — LORAZEPAM 2 MG/ML IJ SOLN
0.5000 mg | Freq: Every day | INTRAMUSCULAR | Status: DC
  Administered 2021-11-12 – 2021-11-23 (×10): 0.5 mg via INTRAVENOUS
  Filled 2021-11-12 (×10): qty 1

## 2021-11-12 MED ORDER — METOPROLOL TARTRATE 12.5 MG HALF TABLET: 12.5000 mg | ORAL_TABLET | Freq: Two times a day (BID) | ORAL | Status: DC

## 2021-11-12 MED ORDER — AMIODARONE HCL IN DEXTROSE 360-4.14 MG/200ML-% IV SOLN
60.0000 mg/h | INTRAVENOUS | Status: AC
Start: 2021-11-12 — End: 2021-11-12
  Administered 2021-11-12 (×2): 60 mg/h via INTRAVENOUS
  Filled 2021-11-12 (×2): qty 200

## 2021-11-12 MED ORDER — DIPHENHYDRAMINE HCL 50 MG/ML IJ SOLN
12.5000 mg | Freq: Once | INTRAMUSCULAR | Status: AC
  Administered 2021-11-12: 12.5 mg via INTRAVENOUS
  Filled 2021-11-12: qty 1

## 2021-11-12 MED ORDER — LORAZEPAM 2 MG/ML IJ SOLN
0.5000 mg | Freq: Once | INTRAMUSCULAR | Status: AC
  Administered 2021-11-12: 0.5 mg via INTRAVENOUS
  Filled 2021-11-12: qty 1

## 2021-11-12 NOTE — Telephone Encounter (Signed)
Patient's spouse called stating the patient was in the hospital and will be there for a few days. The spouse also stated she will  be going to rehab when she leaves the hospital so to cancel her fistulagram procedure on 11/16/21 with Dr. Delana Meyer. The spouse stated he would call  bact to reschedule when she was able to do so. ?

## 2021-11-12 NOTE — Progress Notes (Signed)
Patient unavailable for EEG due to dialysis.  RN asks that we check with him in half an hour for pt availability. ?

## 2021-11-12 NOTE — Progress Notes (Signed)
HOSPITAL MEDICINE OVERNIGHT EVENT NOTE   ? ?Notified that patient has been developing increasing bouts of tachycardia with heart rates up to over 140 bpm.  Telemetry has revealed patient has been going back and forth between sinus tachycardia and atrial fibrillation. ? ?Patient is hemodynamically stable. ? ?I went to go evaluate the patient at the bedside.  Patient is confused and agitated, but not in respiratory distress.    Breath sounds are essentially clear.  Heart examination reveals heart sounds that are regular and tachycardic. ? ?I suspect patient's agitation may be contributing to her degree of tachycardia.  Since patient is intermittently going into atrial fibrillation we will try a one-time dose of 2.5 mg of intravenous metoprolol.   ? ?Continue to monitor patient closely on telemetry. ? ?Vernelle Emerald  MD ?[facility]  ? ? ? ? ? ? ? ? ? ? ?

## 2021-11-12 NOTE — TOC Initial Note (Signed)
Transition of Care (TOC) - Initial/Assessment Note  ? ? ?Patient Details  ?Name: Shawna [name] ?MRN: [id number] ?Date of Birth: [birth year] ? ?Transition of Care (TOC) CM/SW Contact:    ?Benard Halsted, LCSW ?Phone Number: ?11/12/2021, 8:58 AM ? ?Clinical Narrative:                 ?CSW received consult for possible SNF placement at time of discharge. CSW spoke with patient's spouse. He reported that patient used a cane at home and is currently unable to care for patient given patient?s current physical needs and fall risk. Patient's spouse expressed understanding of PT recommendation and is agreeable to SNF placement at time of discharge. CSW discussed insurance authorization process and will provide Medicare SNF ratings list. Patient has received COVID vaccines. Spouse is aware that facility will need to be close to [facility] due to patient's dialysis being there. CSW will send out referrals for review.  ? ?Skilled Nursing Rehab Facilities-   [email or web address]   Ratings out of 5 possible   ?Name Address  Phone # Quality Care Staffing Health Inspection Overall  ?[facility] [address] [place] '4 5 2 3  '$ ?[facility]  [address], [place] [phone] '3 2 5 5  '$ ?[facility] [place], [place] '3 1 1 1  '$ ?[name] [place], [place] '3 2 4 4  '$ ?[facility] [address] [place] '1 1 2 1  '$ ?[place] [phone] '2 1 4 3  '$ ?[place] '5 2 3 4  '$ ?[facility] [address] [place] '5 2 2 3  '$ ?[facility] [place] [phone] '5 1 2 2  '$ ?[facility] [address], [place] [phone] '4 1 2 1  '$ ?[facility] [address] [place] [phone] '4 1 2 1  '$ ?[facility] ([place]) [place] [phone] '4 1 1 1  '$ ?[facility]ock [address] [place] [phone] '3 2 4 4  '$ ?        ?[place], [place]      ?[facility] [place] '4 2 3 3  '$ ?[facility] [place] '4 1 5 4  '$ ?[place] [address] [place] [address], [place] [phone] '2 1 1 1  '$ ?[facility] [address], [place] [phone] '2 1 3 2  '$ ?        ?[address] (no [facility]) [address], [place] [phone] '4 5 5 5  '$ ?[facility] (No Humana) [address] [place] [phone] '3 1 4 3  '$ ?[facility] (No UHC) [address] [place] [phone] '5 5 5 5  '$ ?[facility] [address], [place] [phone] '3 2 4 4  '$ ?[place] [address] [place] '1 1 2 1  '$ ?[facility] [address], [place] [phone] '2 1 1 1  '$ ?[facility] [place], [place] '5 2 4 5  '$ ?[facility] [address] [place] '3 1 1 1  '$ ?[facility] [place], [place] '2 1 2 1  '$ ?        ?[facility] [address] [place] [phone] '1 1 1 1  '$ ?[place] [address], [place]  [phone] '2 4 2 2  '$ ?  [facility] [address], [place] [phone] '5 2 3 4  '$ ?[place] [address], [place] '2 1 1 1  '$ ?[place] (No Humana) [address] [place] '2 1 3 2  '$ ?[facility] [address] [place] [phone] '3 1 1 1  '$ ?        ?[facility] [place], [place] '5 4 5 5  '$ ?[facility])  [address], [place] '2 2 3 3  '$ ?[facility] [place], [place] [phone] '3 2 4 4  '$ ?[facility] [address], [place] '4 3 4 4  '$ ?[address] [place], [place] '3 3 1 1  '$ ?[facility]  [facility]) [address] [place] [phone] '2 2 4 4  '$ ? ? ? ?Expected Discharge Plan: [place] [facility] ?Barriers to Discharge: Continued Medical Work up, Ship broker, SNF Pending bed offer ? ? ?Patient Goals and CMS Choice ?Patient states their goals for this hospitalization and ongoing recovery are:: Rehab ?CMS Medicare.gov Compare Post Acute Care list provided to:: Patient Represenative (must comment) ?Choice offered to / list presented to : Spouse ? ?Expected Discharge Plan and Services ?Expected Discharge Plan: [place] ?In-house Referral: Clinical Social Work ?  ?Post Acute Care Choice: Dialysis, Skilled Nursing Facility ?Living arrangements for the past 2 months: [place] ?Expected Discharge Date: 11/11/21               ?  ?  ?  ?  ?  ?  ?  ?  ?  ?  ? ?Prior Living Arrangements/Services ?Living arrangements for the past 2 months: [place] ?Lives with:: Spouse ?Patient language and need for interpreter reviewed:: Yes ?Do you feel safe going back to the place where you live?: Yes      ?Need for Family Participation in Patient Care: Yes (Comment) ?Care giver support system in place?: Yes (comment) ?Current home services: DME [facility]) ?Criminal Activity/Legal Involvement Pertinent to Current Situation/Hospitalization: No - Comment as needed ? ?Activities of Daily Living ?Home Assistive Devices/Equipment: Cane (specify quad or straight) ?ADL Screening (condition at time of admission) ?Patient's cognitive ability adequate to safely complete daily activities?: Yes ?Is the patient deaf or have difficulty hearing?: No ?Does the patient have difficulty seeing, even when wearing glasses/contacts?: No ?Does the patient have difficulty concentrating, remembering, or making decisions?: Yes ?Patient able to express need for assistance with ADLs?: No ?Does the patient have difficulty dressing or bathing?: Yes ?Independently performs ADLs?:  No ?Communication: Needs assistance ?Is this a change from baseline?: Change from baseline, expected to last <3 days ?Dressing (OT): Needs assistance ?Is this a change from baseline?: Change from baseline, expected to last <3days ?Grooming: Needs assistance ?Is this a change from baseline?: Change from baseline, expected to last <3 days ?Feeding: Needs assistance ?Is this a change from baseline?: Change from baseline, expected to last <3 days ?Bathing: Dependent ?Is this a change from baseline?: Change from baseline, expected to last <3 days ?Toileting: Dependent ?Is this a change from baseline?: Change from baseline, expected to last <3 days ?In/Out Bed: Dependent ?Is this a change from baseline?: Change from baseline, expected to last <3 days ?Walks in Home: Needs assistance ?Is this a change from baseline?: Change from baseline, expected to last <3 days ?Does the patient have difficulty walking or climbing stairs?: Yes ?Weakness of Legs: Both ?  Weakness of Arms/Hands: Both ? ?Permission Sought/Granted ?Permission sought to share information with : Facility Sport and exercise psychologist, Family Supports ?Permission granted to share information with : No ? Share Information with NAME: [name] ? Permission granted to share info w AGENCY: SNFs ? Permission granted to share info w Relationship: Spouse ? Permission granted to share info w Contact Information: [phone] ? ?Emotional Assessment ?Appearance:: Appears stated age ?Attitude/Demeanor/Rapport: Unable to Assess ?Affect (typically observed): Unable to Assess ?Orientation: : Oriented to Self ?Alcohol / Substance Use: Not Applicable ?Psych Involvement: No (comment) ? ?Admission diagnosis:  Encephalopathy acute [G93.40] ?Acute encephalopathy [G93.40] ?Altered mental status, unspecified altered mental status type [R41.82] ?Otitis externa of left ear, unspecified chronicity, unspecified type [H60.92] ?Acute metabolic encephalopathy [X50.56] ?Patient Active Problem List   ? Diagnosis Date Noted  ? Acute metabolic encephalopathy 97/94/8016  ? Acute encephalopathy 11/08/2021  ? NSTEMI, initial episode of care [facility]) 10/19/2021  ? Symptomatic anemia 09/09/2021  ? Ischemic cardiomyopathy

## 2021-11-12 NOTE — Progress Notes (Signed)
[facility] ?Progress Note ? ?Subjective: had HD yest off schedule. Confusion no better today.  ? ?Vitals:  ? 11/11/21 2313 11/12/21 0121 11/12/21 0300 11/12/21 0515  ?BP: 115/90 135/60 (!) 133/58 109/72  ?Pulse: 100 (!) 143 (!) 120 (!) 112  ?Resp: '17 19 18 14  '$ ?Temp: 97.8 ?F (36.6 ?C) 97.8 ?F (36.6 ?C) 98 ?F (36.7 ?C) 97.9 ?F (36.6 ?C)  ?TempSrc: Axillary Oral Axillary Axillary  ?SpO2:      ?Weight:      ?Height:      ? ? ?Exam: ? alert, nad , pt is confused and a bit agitated  ? no jvd ? Chest cta bilat ? Cor reg no RG ? Abd soft ntnd no ascites ?  Ext no LE edema ?  Alert, NF, confused , moves all ext ?    L AVF + T/B ?  ?OP HD: [facility] MWF ?  3.5h 56.5kg   BFR 300   2/2.5 bath LUA AVF  Heparin none ? - venofer 50 mg q Monday ? - sensipar 30 mg tiw ? - calcitriol 1.75 ug po tiw ? - mircera 200 ug q 2wks, last 4/25 ?- 5/01 lab showed HBsAg neg w/ Abs > 10/ protective ? ? ?Assessment/Plan: ?AMS/ gen'd weakness: multifactorial sepsis + polypharmacy. Has good HD compliance. Started on IV antibiotics and holding gabapentin, oxycodone and xanax.  ?L ear mastoiditis: +pseudomonas L ear cx, on IV cefepime. Fever/ ^wbc improving.  ?Atrial fib - w/ RVR here. Started on IV amio now.  ?ESRD: HD MWF. Missed outpt HD Monday. Had HD here Tues + Thursday. HD today, will need to be done in the room later in the evening most likely.  ?Hypokalemia - resolved, K+ 4.3 today ?BP/vol: BP's controlled, at dry wt, euvolemic on exam. Not on any BP lowering meds. Gets midodrine pre HD tiw. ?Anemia of ESRD: last esa was high-dose OP on 4/25. Next due due 5/09. Transfuse prn. Sp 2u prbc's 5/03.  ?Metabolic Bone Disease: binder and vitamin when eating ?Hx CAD/ sp CABG - on statin ?Hx of colon and ovarian cancer ? ? ? ? ?Rob Arturo Sofranko ?11/12/2021, 6:38 AM ? ? ?Recent Labs  ?Lab 11/11/21 ?0701 11/12/21 ?3009  ?HGB 9.7* 10.4*  ?CALCIUM 8.6* 9.2  ?CREATININE 8.21* 4.23*  ?K 3.3* 4.3  ? ? ?Inpatient medications: ? amiodarone  100 mg  Oral Daily  ? Chlorhexidine Gluconate Cloth  6 each Topical Q0600  ? heparin injection (subcutaneous)  5,000 Units Subcutaneous Q8H  ? levothyroxine  25 mcg Oral Q0600  ? midodrine  5 mg Oral Once  ? midodrine  5-10 mg Oral Q M,W,F-HD  ? multivitamin  1 tablet Oral Daily  ? potassium chloride  30 mEq Oral Once  ? rosuvastatin  10 mg Oral Daily  ? sevelamer carbonate  2,400 mg Oral TID WC  ? tobramycin-dexamethasone  2 drop Left EAR Q6H  ? ? sodium chloride    ? sodium chloride    ? ceFEPime (MAXIPIME) IV 1 g (11/11/21 2041)  ? ?sodium chloride, sodium chloride, alteplase, heparin, heparin, hydrOXYzine, lidocaine (PF), lidocaine-prilocaine, pentafluoroprop-tetrafluoroeth ? ? ? ? ? ? ?

## 2021-11-12 NOTE — Progress Notes (Signed)
?PROGRESS NOTE ? ? ? ?Shawna [name]  BPZ:[id number] DOB: 03-06-1941 DOA: 11/08/2021 ?PCP: [facility] ? ? ?Brief Narrative:  ? ?Shawna [name] is a 81 y.o. female with history of paroxysmal atrial fibrillation not on anticoagulation secondary to history of GI bleed, CAD status post CABG and stent placement, ESRD on hemodialysis on Monday Wednesday Friday, prior history of colon cancer, hyperlipidemia was brought to the ER after patient was found to be increasingly confused and weak over the last 24 hours.   ? ?Of note approximately 10 days prior to admission patient presented to the ED with left earache with external otitis media and mastoiditis placed on oral antibiotics at that time.  ? ?Assessment & Plan: ?  ?Principal Problem: ?  Acute encephalopathy ?Active Problems: ?  Hx of CABG ?  Essential hypertension ?  History of colon cancer ?  History of ovarian cancer ?  ESRD (end stage renal disease) on dialysis [facility]) ?  PAF (paroxysmal atrial fibrillation) ([place]) ?  Acute metabolic encephalopathy ? ? ?Acute metabolic encephalopathy, POA ?Hospital delirium ?-Multifactorial ?- possibly related to polypharmacy,  gabapentin, alprazolam and oxycodone on hold since admission ?-Continue to treat infectious etiology with subacute mastoiditis /malignant otitis external ?-As well possibly related to uremia for which she was dialyzed ?-No acute finding on MRI brain, but she is with extensive chronic small vessel ischemia, generalized brain atrophy which indicates some underlining vascular dementia. ?-Patient appears to be more confused today, or agitated and restless today, this is most likely related to worsening hospital delirium, but as well she was on long-term on her alprazolam and oxycodone, so might be some withdrawal factors, so I will give 1 low-dose of IV Ativan, and IV Dilaudid to see if it helps. ?-Unfortunately given her prolonged QTc cannot do antipsychotic at this point ?-I have discussed with her  husband, she does not have any underlying psychiatric diagnosis. ? ?Subacute mastoiditis/otitis externa ?-Previously treated outpatient with improving symptoms per patient and family (no longer having ear pain) ?-ENT input greatly appreciated, she has a wick placed, and started on TobraDex, initially on IV Rocephin, changed to IV cefepime, urine cultures growing Pseudomonas, on IV cefepime for now, timing per ENT.   ?-Cussed with Dr. Melene Plan, no need to remove wick as it will fall and swelling has improved, continue with TobraDex x10 days, continue with IV cefepime x3 weeks ? ?ESRD Monday Wednesday Friday ?-Missed dialysis Monday. ?- HD per renal  ? ?History of paroxysmal atrial fibrillation ?-  not on anticoagulation secondary to GI bleed  ?-Heart rate has been increasingly uncontrolled over the last few days, where she is having episodes of tachycardia heart rate in the 140s, unable to determine rhythm I have discussed with cardiology today, recommendation for amiodarone drip for better heart rate control, prolonged QTc may be attributed as well to her left bundle branch block. ?-When her persistent tachycardia despite amiodarone drip, I have requested cardiology evaluation. ? ?History of CAD status post CABG  ?-continue home statin  ? ?Anemia of chronic kidney disease  ?- EPO, IV iron per renal.   ?-Received 2 units PRBC 5/3 for hemoglobin of 6.3, good response with hemoglobin of 9.6 today. ? ?Hypokalemia ?-Management with HD ? ?Hypertension ?-Blood pressure well controlled currently ? ?History of colon cancer and ovarian cancer. -Completed previous therapy ?   ?DVT prophylaxis: Subcu heparin ?Code Status: Full code. ?Family Communication: None at bedside, discussed with husband by phone. ? ?Status is: Inpatient ? ?Dispo: The  patient is from: Home  ?             Patient currently not medically stable for discharge, will need SNF placement, remains confused, on amiodarone drip. ? ?Consultants:  ?Nephrology,  ENT ? ?Procedures:  ?None ? ? ?Subjective: ? ?Patient  is more confused overnight, she has been refusing all his meds, did not want to eat any breakfast or lunch ? ?Objective: ?Vitals:  ? 11/12/21 1430 11/12/21 1445 11/12/21 1500 11/12/21 1505  ?BP: 124/61 100/89 99/84   ?Pulse:      ?Resp: 17 16 (!) 22 19  ?Temp:  98.4 ?F (36.9 ?C)    ?TempSrc:  Axillary    ?SpO2:      ?Weight:      ?Height:      ? ? ?Intake/Output Summary (Last 24 hours) at 11/12/2021 1545 ?Last data filed at 11/12/2021 0800 ?Gross per 24 hour  ?Intake 200 ml  ?Output 354 ml  ?Net -154 ml  ? ?Filed Weights  ? 11/11/21 1400 11/11/21 1740 11/12/21 1249  ?Weight: 57.8 kg 57.5 kg 55.7 kg  ? ? ?Examination: ? ?Awake, confused, restless, unable to answer any questions appropriately or follow any commands, she is laughing for no apparent reason,. ?Symmetrical Chest wall movement, Good air movement bilaterally, CTAB ?Regular rate regular, tachycardic,No Gallops,Rubs or new Murmurs, No Parasternal Heave ?+ve B.Sounds, Abd Soft, No tenderness, No rebound - guarding or rigidity. ?No Cyanosis, Clubbing or edema, No new Rash or bruise   ? ? ? ?Data Reviewed: I have personally reviewed following labs and imaging studies ? ?CBC: ?Recent Labs  ?Lab 11/08/21 ?2230 11/08/21 ?2242 11/09/21 ?0420 11/10/21 ?0455 11/11/21 ?0701 11/12/21 ?4401  ?WBC 12.3*  --  12.1* 7.7 8.7 7.3  ?HGB 7.2* 7.5* 7.0* 6.3* 9.7* 10.4*  ?HCT 22.9* 22.0* 21.8* 19.3* 29.2* 31.2*  ?MCV 110.1*  --  109.5* 106.6* 98.6 98.4  ?PLT 130*  --  PLATELET CLUMPS NOTED ON SMEAR, UNABLE TO ESTIMATE PLATELET CLUMPS NOTED ON SMEAR, UNABLE TO ESTIMATE 160 PLATELET CLUMPS NOTED ON SMEAR, UNABLE TO ESTIMATE  ? ?Basic Metabolic Panel: ?Recent Labs  ?Lab 11/08/21 ?1800 11/08/21 ?2230 11/08/21 ?2242 11/09/21 ?0420 11/10/21 ?0120 11/11/21 ?0701 11/12/21 ?0272  ?NA 133*  --  132* 133* 139 140 141  ?K 4.3  --  3.9 4.0 3.2* 3.3* 4.3  ?CL 98  --   --  97* 99 101 104  ?CO2 20*  --   --  20* '23 23 23  '$ ?GLUCOSE 129*  --   --   110* 107* 110* 82  ?BUN 52*  --   --  61* 32* 51* 17  ?CREATININE 9.72* 10.33*  --  10.90* 5.91* 8.21* 4.23*  ?CALCIUM 9.1  --   --  9.0 8.4* 8.6* 9.2  ?MG 1.9  --   --   --   --   --   --   ? ?GFR: ?Estimated Creatinine Clearance: 7.9 mL/min (A) (by C-G formula based on SCr of 4.23 mg/dL (H)). ?Liver Function Tests: ?No results for input(s): AST, ALT, ALKPHOS, BILITOT, PROT, ALBUMIN in the last 168 hours. ?No results for input(s): LIPASE, AMYLASE in the last 168 hours. ?Recent Labs  ?Lab 11/08/21 ?1800  ?AMMONIA 33  ? ?Coagulation Profile: ?Recent Labs  ?Lab 11/08/21 ?1800  ?INR 1.3*  ? ?Cardiac Enzymes: ?No results for input(s): CKTOTAL, CKMB, CKMBINDEX, TROPONINI in the last 168 hours. ?BNP (last 3 results) ?No results for input(s): PROBNP in the last 8760 hours. ?  HbA1C: ?No results for input(s): HGBA1C in the last 72 hours. ?CBG: ?Recent Labs  ?Lab 11/08/21 ?1657  ?GLUCAP 103*  ? ?Lipid Profile: ?No results for input(s): CHOL, HDL, LDLCALC, TRIG, CHOLHDL, LDLDIRECT in the last 72 hours. ?Thyroid Function Tests: ?No results for input(s): TSH, T4TOTAL, FREET4, T3FREE, THYROIDAB in the last 72 hours. ?Anemia Panel: ?No results for input(s): VITAMINB12, FOLATE, FERRITIN, TIBC, IRON, RETICCTPCT in the last 72 hours. ?Sepsis Labs: ?No results for input(s): PROCALCITON, LATICACIDVEN in the last 168 hours. ? ?Recent Results (from the past 240 hour(s))  ?Blood culture (routine x 2)     Status: None (Preliminary result)  ? Collection Time: 11/08/21  6:00 PM  ? Specimen: BLOOD  ?Result Value Ref Range Status  ? Specimen Description BLOOD  Final  ? Special Requests   Final  ?  BOTTLES DRAWN AEROBIC AND ANAEROBIC Blood Culture results may not be optimal due to an inadequate volume of blood received in culture bottles RT EJ  ? Culture   Final  ?  NO GROWTH 4 DAYS ?Performed at [facility], [place] [address], [place], [place] [address] ?  ? Report Status PENDING  Incomplete  ?Ear culture     Status: None  ? Collection  Time: 11/09/21  3:30 PM  ? Specimen: Ear; Other  ?Result Value Ref Range Status  ? Specimen Description EAR  Final  ? Special Requests   Final  ?  LEFT EAR ?Performed at [facility], [place] [address], [place]

## 2021-11-12 NOTE — Progress Notes (Signed)
?   11/12/21 0121  ?Assess: MEWS Score  ?Temp 97.8 ?F (36.6 ?C)  ?BP 135/60  ?Pulse Rate (!) 143  ?ECG Heart Rate (!) 139  ?Resp 19  ?Assess: MEWS Score  ?MEWS Temp 0  ?MEWS Systolic 0  ?MEWS Pulse 3  ?MEWS RR 0  ?MEWS LOC 0  ?MEWS Score 3  ?MEWS Score Color Yellow  ?Assess: if the MEWS score is Yellow or Red  ?Were vital signs taken at a resting state? Yes  ?Focused Assessment No change from prior assessment  ?Early Detection of Sepsis Score *See Row Information* Low  ?MEWS guidelines implemented *See Row Information* Yes  ?Treat  ?MEWS Interventions Administered scheduled meds/treatments  ?Pain Scale 0-10  ?Pain Score 0  ?Take Vital Signs  ?Increase Vital Sign Frequency  Yellow: Q 2hr X 2 then Q 4hr X 2, if remains yellow, continue Q 4hrs  ?Escalate  ?MEWS: Escalate Yellow: discuss with charge nurse/RN and consider discussing with provider and RRT  ?Notify: Charge Nurse/RN  ?Name of Charge Nurse/RN Notified Ann, RN  ?Date Charge Nurse/RN Notified 11/12/21  ?Time Charge Nurse/RN Notified 0130  ?Document  ?Patient Outcome Stabilized after interventions  ?Progress note created (see row info) Yes  ? ? ?

## 2021-11-12 NOTE — Consult Note (Signed)
?Cardiology Consultation:  ? ?Patient ID: Shawna [name] ?MRN: [id number]; DOB: June 21, 1941 ? ?Admit date: 11/08/2021 ?Date of Consult: 11/12/2021 ? ?PCP:  [facility] ?  ?[facility] Providers ?Cardiologist:  Carlyle Dolly, MD      ? ? ?Patient Profile:  ? ?Shawna [name] is a 81 y.o. female with a hx of paroxysmal atrial fibrillation not on anticoagulation secondary to history of GI bleed, HFrEF, CAD s/p CABG and stent placement, ESRD on hemodialysis on MWF, prior history of colon cancer, HLD who is being seen 11/12/2021 for the evaluation of atrial fibrillation at the request of Dr. Waldron Labs. ? ?History of Present Illness:  ? ?Shawna [name] is an 81 year old female with above medical history who is followed by Dr. Harl Bowie.  Per chart review, patient was recently admitted to [facility] in January 2023 for NSTEMI.  Echocardiogram at that time showed her EF was reduced to 30-35% with the entire anterior wall, anterior septum, inferoseptal segment, and apex being akinetic.  Work-up was intermediately postponed as patient had severe anemia requiring 1 unit packed red blood cells during admission.  Patient was transferred to [facility] for cardiac catheterization that showed her anatomy was overall similar to prior cath in 2020 with all grafts being patent and residual 75% stenosis along PAD with medical management recommended. ? ?Patient was last seen by cardiology on 07/29/2021.  At that appointment, patient reported having 1 episode of chest pain since hospital discharge which resolved with nitroglycerin.  Patient also reported having some hypotension during dialysis. ? ?Since then, patient was admitted to [facility] on 08/15/2021 for evaluation of chest pain, tachycardia.  Troponins were negative, patient was found to have atrial fibrillation but converted to sinus rhythm on amiodarone.  Patient was later seen on 08/30/2021 in the emergency department for acute headache.  Admitted to [facility] on 09/09/2021 for symptomatic anemia.  Patient was seen at [facility] on 4/9 with left ear pain, chest pain, shortness of breath.  Patient thought to have symptoms related to volume overload rather than ACS.  Patient was discharged home.  Patient was admitted on 4/10 secondary to recurrent intermittent chest discomfort, found to have purulent otitis media at that time. ? ?Patient presented to the ER on 5/1 complaining of weakness.  Her husband reported that because of her weakness she had missed dialysis that day.  Patient was admitted to the hospitalist service for treatment of acute metabolic encephalopathy, hospital delirium, subacute mastoiditis versus otitis media.  Overnight on 5/5, MD was notified that patient had been developing increasing bouts of tachycardia with heart rates up to 140s.  Telemetry revealed patient was going back and forth between sinus tachycardia and atrial fibrillation.  Cardiology was asked to consult. ? ?On interview, patient is oriented to person, not place or time. Denies being in any pain or discomfort.  ? ?Past Medical History:  ?Diagnosis Date  ? Acute on chronic respiratory failure with hypoxia ([place]) 10/10/2016  ? Anxiety   ? Arthritis   ? AVM (arteriovenous malformation) of colon   ? CAD (coronary artery disease)   ? a. s/p CABG in 2013 b. DES to D1 in 10/2016. c. cath in 07/2018 showing patent grafts with occlusion of D1 at prior stent site and progression of PDA disease --> medical management recommended  ? Carotid artery disease ([place])   ? a. 07-37% LICA, 07/624   ? Chronic anemia   ? Chronic bronchitis ([place])   ?  Chronic diastolic CHF (congestive heart failure) ([place])   ? a. 02/2012 Echo EF 60-65%, nl wall motion, Gr 1 DD, mod MR  ? Colon cancer ([place]) 1992  ? Esophageal stricture   ? ESRD on hemodialysis ([place])   ? ESRD due to HTN, started dialysis 2011 and gets HD at [facility] with Dr Hinda Lenis on MWF schedule.  Access is LUA AVF as of Sept 2014.   ? GERD  (gastroesophageal reflux disease)   ? High cholesterol 12/2011  ? History of blood transfusion 07/2011; 12/2011; 01/2012 X 2; 04/2012  ? History of gout   ? History of lower GI bleeding   ? Hypertension   ? Iron deficiency anemia   ? Jugular vein occlusion, right ([place])   ? Mitral regurgitation   ? a. Moderate by echo, 02/2012  ? Myocardial infarction [facility])   ? NSVT (nonsustained ventricular tachycardia) ([place])   ? Ovarian cancer ([place]) 1992  ? PAF (paroxysmal atrial fibrillation) ([place])   ? Pneumonia ~ 2009  ? PUD (peptic ulcer disease)   ? TIA (transient ischemic attack)   ? ? ?Past Surgical History:  ?Procedure Laterality Date  ? A/V SHUNTOGRAM Left 03/19/2019  ? Procedure: A/V SHUNTOGRAM;  Surgeon: Katha Cabal, MD;  Location: [place] [facility];  Service: Cardiovascular;  Laterality: Left;  ? ABDOMINAL HYSTERECTOMY  1992  ? APPENDECTOMY  06/1990  ? AV FISTULA PLACEMENT  07/2009  ? left upper arm  ? AV FISTULA PLACEMENT Right 09/06/2016  ? Procedure: RIGHT FOREARM ARTERIOVENOUS (AV) GRAFT;  Surgeon: Elam Dutch, MD;  Location: [place];  Service: Vascular;  Laterality: Right;  ? AV FISTULA PLACEMENT N/A 02/24/2017  ? Procedure: INSERTION OF ARTERIOVENOUS (AV) GORE-TEX GRAFT ARM (BRACHIAL AXILLARY);  Surgeon: Katha Cabal, MD;  Location: [facility];  Service: Vascular;  Laterality: N/A;  ? [place] REMOVAL Right 09/06/2016  ? Procedure: REMOVAL OF Right Arm ARTERIOVENOUS GORETEX GRAFT and Vein Patch angioplasty of brachial artery;  Surgeon: Angelia Mould, MD;  Location: [place];  Service: Vascular;  Laterality: Right;  ? BIOPSY  09/26/2019  ? Procedure: BIOPSY;  Surgeon: Rogene Houston, MD;  [facility];  Service: Endoscopy;;  ? COLON RESECTION  1992  ? COLON SURGERY    ? COLONOSCOPY N/A 03/09/2019  ? Procedure: COLONOSCOPY;  Surgeon: Rogene Houston, MD;  [facility];  Service: Endoscopy;  Laterality: N/A;  ? COLONOSCOPY WITH PROPOFOL N/A 06/08/2021  ? Procedure: COLONOSCOPY WITH  PROPOFOL;  Surgeon: Harvel Quale, MD;  [facility];  Service: Gastroenterology;  Laterality: N/A;  9:05 /Patient is on dialysis Mon Wed Fri  ? CORONARY ANGIOPLASTY WITH STENT PLACEMENT  12/15/11  ? "2"  ? CORONARY ANGIOPLASTY WITH STENT PLACEMENT  y/2013  ? "1; makes total of 3" (05/02/2012)  ? CORONARY ARTERY BYPASS GRAFT  06/13/2012  ? Procedure: CORONARY ARTERY BYPASS GRAFTING (CABG);  Surgeon: Grace Isaac, MD;  Location: [place];  Service: Open Heart Surgery;  Laterality: N/A;  cabg x four;  using left internal mammary artery, and left leg greater saphenous vein harvested endoscopically  ? CORONARY STENT INTERVENTION N/A 10/13/2016  ? Procedure: Coronary Stent Intervention;  Surgeon: Troy Sine, MD;  Location: [place] [facility];  Service: Cardiovascular;  Laterality: N/A;  ? DIALYSIS/PERMA CATHETER REMOVAL N/A 04/18/2017  ? Procedure: DIALYSIS/PERMA CATHETER REMOVAL;  Surgeon: Katha Cabal, MD;  Location: [place] [facility];  Service: Cardiovascular;  Laterality: N/A;  ?  DILATION AND CURETTAGE OF UTERUS    ? ENTEROSCOPY N/A 06/08/2021  ? Procedure: PUSH ENTEROSCOPY;  Surgeon: Harvel Quale, MD;  [facility];  Service: Gastroenterology;  Laterality: N/A;  ? ESOPHAGOGASTRODUODENOSCOPY  01/20/2012  ? Procedure: ESOPHAGOGASTRODUODENOSCOPY (EGD);  Surgeon: Ladene Artist, MD,FACG;  Location: [facility];  Service: Endoscopy;  Laterality: N/A;  ? ESOPHAGOGASTRODUODENOSCOPY N/A 03/26/2013  ? Procedure: ESOPHAGOGASTRODUODENOSCOPY (EGD);  Surgeon: Irene Shipper, MD;  Location: [facility]OSCOPY;  Service: Endoscopy;  Laterality: N/A;  ? ESOPHAGOGASTRODUODENOSCOPY N/A 04/30/2015  ? Procedure: ESOPHAGOGASTRODUODENOSCOPY (EGD);  Surgeon: Rogene Houston, MD;  [facility];  Service: Endoscopy;  Laterality: N/A;  1pm - moved to 10/20 @ 1:10  ? ESOPHAGOGASTRODUODENOSCOPY N/A 07/29/2016  ? Procedure: ESOPHAGOGASTRODUODENOSCOPY (EGD);  Surgeon: Manus Gunning, MD;  Location: [place];  Service: Gastroenterology;  Laterality: N/A;  enteroscopy  ? ESOPHAGOGASTRODUODENOSCOPY N/A 09/26/2019  ? Procedure: ESOPHAGOGASTRODUODENOSCOPY (EGD);  Surgeon: Juanda Chance

## 2021-11-12 NOTE — Progress Notes (Signed)
Patient's HR has been running 130s-140s. Patient is confused and agitated. MD notified of patient's HR. EKG and VS obtained. ?

## 2021-11-12 NOTE — Plan of Care (Signed)
Remains in AF w/RVR from overnight. Remains Yellow MEWS. BP remains WDL. Dr. Waldron Labs messaged this AM regarding the AF. Patient refusing PO meds or intake this AM; likely due to delirium. ? ? ?Problem: Education: ?Goal: Knowledge of General Education information will improve ?Description: Including pain rating scale, medication(s)/side effects and non-pharmacologic comfort measures ?Outcome: Not Progressing ?  ?Problem: Health Behavior/Discharge Planning: ?Goal: Ability to manage health-related needs will improve ?Outcome: Not Progressing ?  ?Problem: Clinical Measurements: ?Goal: Ability to maintain clinical measurements within normal limits will improve ?Outcome: Not Progressing ?Goal: Will remain free from infection ?Outcome: Not Progressing ?Goal: Diagnostic test results will improve ?Outcome: Not Progressing ?Goal: Respiratory complications will improve ?Outcome: Not Progressing ?Goal: Cardiovascular complication will be avoided ?Outcome: Not Progressing ?  ?Problem: Activity: ?Goal: Risk for activity intolerance will decrease ?Outcome: Not Progressing ?  ?Problem: Nutrition: ?Goal: Adequate nutrition will be maintained ?Outcome: Not Progressing ?  ?Problem: Coping: ?Goal: Level of anxiety will decrease ?Outcome: Not Progressing ?  ?Problem: Elimination: ?Goal: Will not experience complications related to bowel motility ?Outcome: Not Progressing ?Goal: Will not experience complications related to urinary retention ?Outcome: Not Progressing ?  ?Problem: Pain Managment: ?Goal: General experience of comfort will improve ?Outcome: Not Progressing ?  ?Problem: Safety: ?Goal: Ability to remain free from injury will improve ?Outcome: Not Progressing ?  ?Problem: Skin Integrity: ?Goal: Risk for impaired skin integrity will decrease ?Outcome: Not Progressing ?  ? ?

## 2021-11-12 NOTE — Progress Notes (Signed)
removed 562ms net fluid.  unable to remove more due to tachycardia in 150s new ekg ordered and completed at that time. baseline 120s upon arrival on amiodarone drip.  pt started to itch profusely last hour of tx pt access started bleeding due to patient moving arms all over the entire treatment,  restraints ordered last hour of treatment.  pt also given iv benadryl by primary rn as ordered.  pre bp 126/67 post bp 156/43 pre weight 55.7kg post weight 55.0kg bed scales.  2 bandages to lua avg. some bleeding post tx, expected due to trauma from constant bending of the arms.   minimal blood loss. ?

## 2021-11-12 NOTE — NC FL2 (Addendum)
?[place] MEDICAID FL2 LEVEL OF CARE SCREENING TOOL  ?  ? ?IDENTIFICATION  ?Patient Name: ?Shawna [name] Birthdate: September 18, 1940 Sex: female Admission Date (Current Location): ?11/08/2021  ?[place] and [place] Number: ?  [place]) ?  Facility and Address:  ?[facility], [place] [address], [place], [place] [address] ?     Provider Number: ?[id number]  ?Attending Physician Name and Address:  ?Elgergawy, Silver Huguenin, MD ? Relative Name and Phone Number:  ?  ?   ?Current Level of Care: ?Hospital Recommended Level of Care: ?[place] Prior Approval Number: ?  ? ?Date Approved/Denied: ?  PASRR Number: ?[id number] A ? ?Discharge Plan: ?SNF ?  ? ?Current Diagnoses: ?Patient Active Problem List  ? Diagnosis Date Noted  ? Acute metabolic encephalopathy 84/69/6295  ? Acute encephalopathy 11/08/2021  ? NSTEMI, initial episode of care [facility]) 10/19/2021  ? Symptomatic anemia 09/09/2021  ? Ischemic cardiomyopathy   ? Pulmonary edema 07/16/2021  ? Paroxysmal Atrial fibrillation/Flutter 05/07/2021  ? Vitamin B deficiency 04/23/2021  ? Neuropathy 04/23/2021  ? Anemia due to chronic blood loss 04/13/2021  ? Left knee pain 03/15/2021  ? Moderate protein-calorie malnutrition ([place]) 02/27/2021  ? Hypokalemia 02/27/2021  ? COVID-19 virus infection 02/03/2021  ? Leukocytosis 02/03/2021  ? Elevated MCV 02/03/2021  ? Hypoalbuminemia due to protein-calorie malnutrition ([place]) 02/03/2021  ? Hypothyroidism (acquired) 02/03/2021  ? Myositis 12/03/2020  ? Ataxia 12/02/2020  ? Diabetes mellitus type 2 in nonobese ([place]) 11/10/2020  ? Acute pulmonary edema ([place]) 11/10/2020  ? Generalized weakness 11/09/2020  ? Dialysis AV fistula malfunction, initial encounter ([place])   ? Jugular vein occlusion, right ([employer])   ? Failure of surgically constructed arteriovenous fistula ([place]) 10/03/2020  ? Myoclonus 08/31/2020  ? Clotted renal dialysis AV graft, initial encounter ([place])   ? Hypertensive heart and chronic kidney disease with heart  failure and stage 1 through stage 4 chronic kidney disease, or chronic kidney disease ([place])   ? Hemodialysis-associated hypotension   ? Acute hypoxemic respiratory failure ([place]) 06/14/2020  ? Hypertension 06/12/2020  ? Irritable bowel syndrome 02/25/2020  ? Adenomatous duodenal polyp 09/10/2019  ? History of GI bleed 09/10/2019  ? Angina pectoris ([place]) 06/05/2019  ? Chest pain 06/03/2019  ? Small intestinal bacterial overgrowth 05/14/2019  ? Iron deficiency anemia 04/02/2019  ? Acute respiratory failure with hypoxia ([place]) 12/25/2018  ? Elevated troponin 12/14/2018  ? Chest pain at rest 07/13/2018  ? Hand steal syndrome ([place]) 08/01/2017  ? Anemia 07/14/2017  ? Coronary artery disease 06/05/2017  ? Complication of vascular access for dialysis 03/19/2017  ? Preoperative clearance 01/25/2017  ? H/O non-ST elevation myocardial infarction (NSTEMI) 10/24/2016  ? Fluid overload 10/10/2016  ? Non-ST elevation (NSTEMI) myocardial infarction [facility])   ? Acute on chronic respiratory failure with hypoxia (HCC)   ? Cardiac arrest [facility])   ? Palliative care encounter   ? Goals of care, counseling/discussion   ? Hypertensive crisis without congestive heart failure 05/09/2016  ? Flash pulmonary edema ([place]) 04/06/2016  ? Acute respiratory failure ([place]) 04/06/2016  ? History of colon cancer 01/27/2016  ? History of ovarian cancer 01/27/2016  ? Hypertensive urgency 01/27/2016  ? PAF (paroxysmal atrial fibrillation) ([place]) 10/14/2015  ? Malignant neoplasm of right ovary ([place]) 10/14/2015  ? SVT (supraventricular tachycardia) ([place]) 09/08/2015  ? Influenza A 08/30/2015  ? Acute on chronic diastolic CHF (congestive heart failure) ([place]) 05/04/2015  ? Essential hypertension   ? Dyspnea   ? Chronic combined systolic and diastolic  CHF (congestive heart failure) ([place]) 03/22/2013  ? Occlusion and stenosis of carotid artery without mention of cerebral infarction 01/24/2013  ? Hx of CABG 07/05/2012  ? Carotid artery disease ([place]) 07/05/2012  ? Mitral  regurgitation 06/12/2012  ? Non-STEMI (non-ST elevated myocardial infarction) ([place]) 06/08/2012  ? Ischemic chest pain ([place]) 03/01/2012  ? AVM (arteriovenous malformation) of small bowel, acquired 01/20/2012  ? GERD (gastroesophageal reflux disease) 01/09/2012  ? HLD (hyperlipidemia) 01/05/2012  ? Atherosclerotic heart disease of native coronary artery without angina pectoris 12/16/2011  ? Anxiety disorder 05/04/2011  ? Anemia in chronic kidney disease 04/29/2011  ? ESRD (end stage renal disease) on dialysis ([place]) 04/29/2011  ? Gout 04/29/2011  ? Hypertensive chronic kidney disease with stage 5 chronic kidney disease or end stage renal disease ([place]) 04/29/2011  ? ? ?Orientation RESPIRATION BLADDER Height & Weight   ?  ?Self ? Normal Incontinent Weight: 126 lb 12.2 oz (57.5 kg) ?Height:  '5\' 1"'$  (154.9 cm)  ?BEHAVIORAL SYMPTOMS/MOOD NEUROLOGICAL BOWEL NUTRITION STATUS  ?    Incontinent Diet (See dc summary)  ?AMBULATORY STATUS COMMUNICATION OF NEEDS Skin   ?Limited Assist Verbally Normal ?  ?  ?  ?    ?     ?     ? ? ?Personal Care Assistance Level of Assistance  ?Bathing, Feeding, Dressing Bathing Assistance: Maximum assistance ?Feeding assistance: Independent ?Dressing Assistance: Limited assistance ?   ? ?Functional Limitations Info  ?Sight, Hearing Sight Info: Impaired ?Hearing Info: Impaired ?   ? ? ?SPECIAL CARE FACTORS FREQUENCY  ?PT (By licensed PT), OT (By licensed OT)   ?  ?PT Frequency: 5x/week ?OT Frequency: 5x/week ?  ?  ?  ?   ? ? ?Contractures Contractures Info: Not present  ? ? ?Additional Factors Info  ?Code Status, Allergies Code Status Info: Full ?Allergies Info: Amlodipine, Aspirin, Nitrofurantoin, Penicillins, Bactrim (Sulfamethoxazole-trimethoprim), Contrast Media (Iodinated Contrast Media), Iron, Tylenol (Acetaminophen), Gabapentin, Iron Sucrose, Ranexa (Ranolazine), Sucroferric Oxyhydroxide, Dexilant (Dexlansoprazole), Hydralazine, Levaquin (Levofloxacin In D5w), Morphine And Related,  Pantoprazole, Plavix (Clopidogrel Bisulfate), Protonix (Pantoprazole Sodium), Venofer (Ferric Oxide) ?  ?  ?  ?   ? ?Current Medications (11/12/2021):  This is the current hospital active medication list ?Current Facility-Administered Medications  ?Medication Dose Route Frequency Provider Last Rate Last Admin  ? 0.9 %  sodium chloride infusion  100 mL Intravenous PRN [name], MD      ? 0.9 %  sodium chloride infusion  100 mL Intravenous PRN [name], MD      ? alteplase (CATHFLO ACTIVASE) injection 2 mg  2 mg Intracatheter Once PRN [name], MD      ? amiodarone (NEXTERONE) 1.8 mg/mL load via infusion 150 mg  150 mg Intravenous Once Elgergawy, Silver Huguenin, MD      ? Followed by  ? amiodarone (NEXTERONE PREMIX) 360-4.14 MG/200ML-% (1.8 mg/mL) IV infusion  60 mg/hr Intravenous Continuous Elgergawy, Silver Huguenin, MD      ? Followed by  ? amiodarone (NEXTERONE PREMIX) 360-4.14 MG/200ML-% (1.8 mg/mL) IV infusion  30 mg/hr Intravenous Continuous Elgergawy, Silver Huguenin, MD      ? ceFEPIme (MAXIPIME) 1 g in sodium chloride 0.9 % 100 mL IVPB  1 g Intravenous Daily Reome, Earle J, RPH   Stopped at 11/11/21 2111  ? Chlorhexidine Gluconate Cloth 2 % PADS 6 each  6 each Topical Q0600 [name], MD   6 each at 11/12/21 [phone]  ? heparin injection 1,000 Units  1,000 Units Dialysis PRN Hollie Salk,  Benjamine Mola, MD      ? heparin injection 1,500 Units  1,500 Units Dialysis PRN Roney Jaffe, MD      ? heparin injection 5,000 Units  5,000 Units Subcutaneous Q8H Elgergawy, Silver Huguenin, MD   5,000 Units at 11/12/21 5929  ? hydrOXYzine (ATARAX) tablet 25 mg  25 mg Oral TID PRN Vernelle Emerald, MD      ? levothyroxine (SYNTHROID) tablet 25 mcg  25 mcg Oral Q0600 Rise Patience, MD   25 mcg at 11/12/21 0516  ? lidocaine (PF) (XYLOCAINE) 1 % injection 5 mL  5 mL Intradermal PRN [name], MD      ? lidocaine-prilocaine (EMLA) cream 1 application.  1 application. Topical PRN [name], MD      ? midodrine  (PROAMATINE) tablet 5 mg  5 mg Oral Once Elgergawy, Silver Huguenin, MD      ? midodrine (PROAMATINE) tablet 5-10 mg  5-10 mg Oral Q M,W,F-HD Rise Patience, MD   10 mg at 11/10/21 1202  ? multivitamin (RENA-VIT) tab

## 2021-11-12 NOTE — Care Management Important Message (Signed)
Important Message ? ?Patient Details  ?Name: Shawna [name] ?MRN: [id number] ?Date of Birth: Dec 31, 1940 ? ? ?Medicare Important Message Given:  Yes ? ? ? ? ?[name] ?11/12/2021, 4:35 PM ?

## 2021-11-13 ENCOUNTER — Inpatient Hospital Stay (HOSPITAL_COMMUNITY): Payer: Medicare HMO

## 2021-11-13 DIAGNOSIS — I5022 Chronic systolic (congestive) heart failure: Secondary | ICD-10-CM

## 2021-11-13 DIAGNOSIS — N186 End stage renal disease: Secondary | ICD-10-CM | POA: Diagnosis not present

## 2021-11-13 DIAGNOSIS — G9340 Encephalopathy, unspecified: Secondary | ICD-10-CM | POA: Diagnosis not present

## 2021-11-13 DIAGNOSIS — H6092 Unspecified otitis externa, left ear: Secondary | ICD-10-CM | POA: Diagnosis not present

## 2021-11-13 LAB — BASIC METABOLIC PANEL
Anion gap: 11 (ref 5–15)
BUN: 11 mg/dL (ref 8–23)
CO2: 28 mmol/L (ref 22–32)
Calcium: 9.4 mg/dL (ref 8.9–10.3)
Chloride: 99 mmol/L (ref 98–111)
Creatinine, Ser: 2.95 mg/dL — ABNORMAL HIGH (ref 0.44–1.00)
GFR, Estimated: 15 mL/min — ABNORMAL LOW (ref >60)
Glucose, Bld: 80 mg/dL (ref 70–99)
Potassium: 3.8 mmol/L (ref 3.5–5.1)
Sodium: 138 mmol/L (ref 135–145)

## 2021-11-13 LAB — CBC
HCT: 31.7 % — ABNORMAL LOW (ref 36.0–46.0)
Hemoglobin: 10 g/dL — ABNORMAL LOW (ref 12.0–15.0)
MCH: 31.6 pg (ref 26.0–34.0)
MCHC: 31.5 g/dL (ref 30.0–36.0)
MCV: 100.3 fL — ABNORMAL HIGH (ref 80.0–100.0)
Platelets: UNDETERMINED 10*3/uL (ref 150–400)
RBC: 3.16 MIL/uL — ABNORMAL LOW (ref 3.87–5.11)
RDW: 20.4 % — ABNORMAL HIGH (ref 11.5–15.5)
WBC: 7.9 10*3/uL (ref 4.0–10.5)
nRBC: 0 % (ref 0.0–0.2)

## 2021-11-13 LAB — CULTURE, BLOOD (ROUTINE X 2): Culture: NO GROWTH

## 2021-11-13 MED ORDER — SODIUM CHLORIDE 0.9 % IV SOLN
INTRAVENOUS | Status: AC
Start: 2021-11-13 — End: 2021-11-13

## 2021-11-13 MED ORDER — LORAZEPAM 2 MG/ML IJ SOLN
1.0000 mg | Freq: Once | INTRAMUSCULAR | Status: AC
  Administered 2021-11-13: 1 mg via INTRAVENOUS
  Filled 2021-11-13: qty 1

## 2021-11-13 MED ORDER — SODIUM CHLORIDE 0.9 % IV SOLN: 2.0000 g | INTRAVENOUS | Status: DC

## 2021-11-13 NOTE — Progress Notes (Signed)
Dr. Tobias Alexander ordered '1mg'$  of ATIVAN. Patient was being uncooperative and hostile. Pushed tech when trying to glue wires. Tried restraining her with two Nts. Patient became extremely hostile.  ?

## 2021-11-13 NOTE — Progress Notes (Signed)
LTM EEG hooked up and running - no initial skin breakdown - push button tested - neuro notified. [facility] monitoring.  

## 2021-11-13 NOTE — Progress Notes (Signed)
?[facility] ?Progress Note  ? ?Subjective:    ?Seen and examined patient at bedside. IV team also at bedside placing an IV. Currently pleasantly confused. Noted minimal net UF during yesterday's HD d/t HR in 150s during tx. Appears HR trending between 100-130s.   ? ?Objective ?Vitals:  ? 11/12/21 2300 11/13/21 0311 11/13/21 0500 11/13/21 0800  ?BP: (!) 120/58 133/60 113/61 (!) 128/58  ?Pulse: (!) 111 (!) 116 (!) 119   ?Resp: '18 18  17  '$ ?Temp: 97.8 ?F (36.6 ?C) 97.8 ?F (36.6 ?C)  (!) 97.5 ?F (36.4 ?C)  ?TempSrc: Axillary Axillary  Axillary  ?SpO2:      ?Weight:      ?Height:      ? ?Physical Exam ?General: Awake and pleasantly confused; NAD ?Heart: S1 and S2; No murmurs, gallops, or rubs ?Lungs: Clear anteriorly; No wheezing, rales, or rhonchi ?Abdomen: Soft and non-tender ?Extremities: No edema BLLE ?Dialysis Access: LUE AVF (+) B/T  ? ?Filed Weights  ? 11/11/21 1740 11/12/21 1249 11/12/21 1620  ?Weight: 57.5 kg 55.7 kg 54.8 kg  ? ? ?Intake/Output Summary (Last 24 hours) at 11/13/2021 1235 ?Last data filed at 11/12/2021 1730 ?Gross per 24 hour  ?Intake 313.28 ml  ?Output 500 ml  ?Net -186.72 ml  ? ? ?Additional Objective ?Labs: ?Basic Metabolic Panel: ?Recent Labs  ?Lab 11/11/21 ?0701 11/12/21 ?4332 11/13/21 ?9518  ?NA 140 141 138  ?K 3.3* 4.3 3.8  ?CL 101 104 99  ?CO2 '23 23 28  '$ ?GLUCOSE 110* 82 80  ?BUN 51* 17 11  ?CREATININE 8.21* 4.23* 2.95*  ?CALCIUM 8.6* 9.2 9.4  ? ?Liver Function Tests: ?No results for input(s): AST, ALT, ALKPHOS, BILITOT, PROT, ALBUMIN in the last 168 hours. ?No results for input(s): LIPASE, AMYLASE in the last 168 hours. ?CBC: ?Recent Labs  ?Lab 11/09/21 ?0420 11/10/21 ?8416 11/11/21 ?0701 11/12/21 ?6063 11/13/21 ?0160  ?WBC 12.1* 7.7 8.7 7.3 7.9  ?HGB 7.0* 6.3* 9.7* 10.4* 10.0*  ?HCT 21.8* 19.3* 29.2* 31.2* 31.7*  ?MCV 109.5* 106.6* 98.6 98.4 100.3*  ?PLT PLATELET CLUMPS NOTED ON SMEAR, UNABLE TO ESTIMATE PLATELET CLUMPS NOTED ON SMEAR, UNABLE TO ESTIMATE 160 PLATELET CLUMPS  NOTED ON SMEAR, UNABLE TO ESTIMATE PLATELET CLUMPS NOTED ON SMEAR, UNABLE TO ESTIMATE  ? ?Blood Culture ?   ?Component Value Date/Time  ? SDES EAR 11/09/2021 1530  ? SPECREQUEST  11/09/2021 1530  ?  LEFT EAR ?Performed at [facility], [place] [address] [place], [place] [address] ?  ? CULT RARE PSEUDOMONAS AERUGINOSA 11/09/2021 1530  ? REPTSTATUS 11/11/2021 FINAL 11/09/2021 1530  ? ? ?Cardiac Enzymes: ?No results for input(s): CKTOTAL, CKMB, CKMBINDEX, TROPONINI in the last 168 hours. ?CBG: ?Recent Labs  ?Lab 11/08/21 ?1657 11/12/21 ?1930  ?GLUCAP 103* 83  ? ?Iron Studies: No results for input(s): IRON, TIBC, TRANSFERRIN, FERRITIN in the last 72 hours. ?Lab Results  ?Component Value Date  ? INR 1.3 (H) 11/08/2021  ? INR 1.0 02/03/2021  ? INR 1.1 06/12/2020  ? ?Studies/Results: ?EEG adult ? ?Result Date: 11/13/2021 ?Lora Havens, MD     11/13/2021 10:51 AM Patient Name: VERDELLE VALTIERRA MRN: [id number] Epilepsy Attending: Lora Havens Referring Physician/Provider: Albertine Patricia, MD Date: 11/13/2021 Duration: 21.10 mins Patient history: 81yo F with ams. EEG to evaluate for seizure Level of alertness: Awake AEDs during EEG study: Ativan Technical aspects: This EEG study was done with scalp electrodes positioned according to the 10-20 International system of electrode placement. Electrical activity was acquired  at a sampling rate of '500Hz'$  and reviewed with a high frequency filter of '70Hz'$  and a low frequency filter of '1Hz'$ . EEG data were recorded continuously and digitally stored. Description: No posterior dominant rhythm was seen. EEG showed continuous generalized 3 to 6 Hz theta-delta slowing. Generalized periodic discharges with triphasic morphology at 1-'2Hz'$  were also noted, more prominent when awake/stimulated. Hyperventilation and photic stimulation were not performed.   ABNORMALITY - Periodic discharges with triphasic morphology, generalized ( GPDs) - Continuous slow, generalized IMPRESSION: This study  showed generalized periodic discharges with triphasic morphology at  1-'2Hz'$  which can be on the ictal-interictal continuum. However, the morphology, frequency and reactivity to stimulation is more likely indicative of toxic-metabolic causes. Additionally there is moderate diffuse encephalopathy, nonspecific etiology. No seizures were seen throughout the recording. [name]   ? ?Medications: ? sodium chloride    ? sodium chloride    ? sodium chloride Stopped (11/12/21 1719)  ? sodium chloride    ? amiodarone Stopped (11/13/21 1120)  ? ceFEPime (MAXIPIME) IV 200 mL/hr at 11/12/21 1730  ? ? Chlorhexidine Gluconate Cloth  6 each Topical Q0600  ? heparin injection (subcutaneous)  5,000 Units Subcutaneous Q8H  ? levothyroxine  25 mcg Oral Q0600  ? LORazepam  0.5 mg Intravenous QHS  ? metoprolol tartrate  5 mg Intravenous Q8H  ? midodrine  5 mg Oral Once  ? midodrine  5-10 mg Oral Q M,W,F-HD  ? multivitamin  1 tablet Oral Daily  ? potassium chloride  30 mEq Oral Once  ? rosuvastatin  10 mg Oral Daily  ? sevelamer carbonate  2,400 mg Oral TID WC  ? tobramycin-dexamethasone  2 drop Left EAR Q6H  ? ? ?Dialysis Orders: ?[facility] MWF ?  3.5h 56.5kg   BFR 300   2/2.5 bath LUA AVF  Heparin none ? - venofer 50 mg q Monday ? - sensipar 30 mg tiw ? - calcitriol 1.75 ug po tiw ? - mircera 200 ug q 2wks, last 4/25 ?- 5/01 lab showed HBsAg neg w/ Abs > 10/ protective ?  ?Assessment/Plan: ?AMS/ gen'd weakness: multifactorial sepsis + polypharmacy. Has good HD compliance. Started on IV antibiotics and holding gabapentin, oxycodone and xanax.  ?L ear mastoiditis: +pseudomonas L ear cx, on IV cefepime. Fever/ ^wbc improving.  ?Atrial fib - w/ RVR here. Started on IV amio now.  ?ESRD: HD MWF. Next HD 11/15/21. ?Hypokalemia - resolved, K+ 3.8 today ?BP/vol: BP's controlled, at dry wt, euvolemic on exam. Not on any BP lowering meds. Gets midodrine pre HD tiw. ?Anemia of ESRD: Hgb now 10, last esa was high-dose OP on 4/25. Next due due  5/09. Transfuse prn. Sp 2u prbc's 5/03.  ?Metabolic Bone Disease: binder and vitamin when eating ?Hx CAD/ sp CABG - on statin ?Hx of colon and ovarian cancer ? ?Tobie Poet, NP ?[facility] ?11/13/2021,12:35 PM ? LOS: 4 days  ?  ?

## 2021-11-13 NOTE — Consult Note (Addendum)
Neurology Consultation ? ?Reason for Consult: AMS ?Referring Physician: Dr. Waldron Labs ? ?CC: confusion and weakness ? ?History is obtained from:medical record.  ? ?HPI: Shawna [name] is a 81 y.o. female with past medical history of Anxiety, depression, CAD, CHF, HTN, HLD, MI, NSVT, P. A Fib (not on AC due to GI Bleed), ESRD on HD, TIA, ovarian Cancer, hx of GI Bleed, AVM of colon, PUD, chronic anemia, GERD, who [facility] on [facility]1 for evaluation worsening confusion and weakness over the prior 24 hrs. Due to the weakness patient missed he HD session. There was a concern for polypharmacy since patient was taking gabgabapentin, alprazolam and oxycodone which have been on hold since admission.  ?Of note on 4/21 patient presented to the ED for left ear pain and found to have acute diffuse otitis externa and mastoiditis of left side and was placed on antibiotics.  ?Neurology was consulted for ongoing confusion and AMS. On exam patient is awake and alert, poor attention span. She can tell me her name and birth month and day, however answers are delayed. She does perseverate answers when asked different questions. To most questions or directions she answers "yes Ma'am". She can name a few objects such as cup and glasses. She will intermittently follow commands. She denies any pain. No family at bedside. Attempted to call husband, no answer unable to leave a message.  ? ?ROS:  Unable to obtain due to altered mental status.  ? ?Past Medical History:  ?Diagnosis Date  ? Acute on chronic respiratory failure with hypoxia ([place]) 10/10/2016  ? Anxiety   ? Arthritis   ? AVM (arteriovenous malformation) of colon   ? CAD (coronary artery disease)   ? a. s/p CABG in 2013 b. DES to D1 in 10/2016. c. cath in 07/2018 showing patent grafts with occlusion of D1 at prior stent site and progression of PDA disease --> medical management recommended  ? Carotid artery disease ([place])   ? a. 41-93% LICA, 01/9023   ? Chronic anemia   ?  Chronic bronchitis ([place])   ? Chronic diastolic CHF (congestive heart failure) ([place])   ? a. 02/2012 Echo EF 60-65%, nl wall motion, Gr 1 DD, mod MR  ? Colon cancer ([place]) 1992  ? Esophageal stricture   ? ESRD on hemodialysis ([place])   ? ESRD due to HTN, started dialysis 2011 and gets HD at [facility] with Dr Hinda Lenis on MWF schedule.  Access is LUA AVF as of Sept 2014.   ? GERD (gastroesophageal reflux disease)   ? High cholesterol 12/2011  ? History of blood transfusion 07/2011; 12/2011; 01/2012 X 2; 04/2012  ? History of gout   ? History of lower GI bleeding   ? Hypertension   ? Iron deficiency anemia   ? Jugular vein occlusion, right ([place])   ? Mitral regurgitation   ? a. Moderate by echo, 02/2012  ? Myocardial infarction [facility])   ? NSVT (nonsustained ventricular tachycardia) ([place])   ? Ovarian cancer ([place]) 1992  ? PAF (paroxysmal atrial fibrillation) ([place])   ? Pneumonia ~ 2009  ? PUD (peptic ulcer disease)   ? TIA (transient ischemic attack)   ? ? ? ?Family History  ?Problem Relation Age of Onset  ? Heart disease Mother   ?     Heart Disease before age 65  ? Hyperlipidemia Mother   ? Hypertension Mother   ? Diabetes Mother   ? Heart attack Mother   ? Heart disease  Father   ?     Heart Disease before age 63  ? Hyperlipidemia Father   ? Hypertension Father   ? Diabetes Father   ? Diabetes Sister   ? Hypertension Sister   ? Diabetes Brother   ? Hyperlipidemia Brother   ? Heart attack Brother   ? Hypertension Sister   ? Heart attack Brother   ? Colon cancer Child 90  ? Other Other   ?     noncontributory for early CAD  ? Esophageal cancer Neg Hx   ? Liver disease Neg Hx   ? Kidney disease Neg Hx   ? Colon polyps Neg Hx   ? ? ? ?Social History:  ? reports that she has never smoked. She has never used smokeless tobacco. She reports that she does not drink alcohol and does not use drugs. ? ?Medications ? ?Current Facility-Administered Medications:  ?  0.9 %  sodium chloride infusion, 100 mL, Intravenous, PRN, [name],  MD ?  0.9 %  sodium chloride infusion, 100 mL, Intravenous, PRN, [name], MD ?  0.9 %  sodium chloride infusion, , Intravenous, PRN, Elgergawy, Silver Huguenin, MD, Stopped at 11/12/21 1719 ?  0.9 %  sodium chloride infusion, , Intravenous, Continuous, Elgergawy, Silver Huguenin, MD, Last Rate: 50 mL/hr at 11/13/21 1336, New Bag at 11/13/21 1336 ?  alteplase (CATHFLO ACTIVASE) injection 2 mg, 2 mg, Intracatheter, Once PRN, [name], MD ?  Margrett Rud amiodarone (NEXTERONE) 1.8 mg/mL load via infusion 150 mg, 150 mg, Intravenous, Once, 150 mg at 11/12/21 0919 **FOLLOWED BY** [EXPIRED] amiodarone (NEXTERONE PREMIX) 360-4.14 MG/200ML-% (1.8 mg/mL) IV infusion, 60 mg/hr, Intravenous, Continuous, Stopped at 11/12/21 1518 **FOLLOWED BY** amiodarone (NEXTERONE PREMIX) 360-4.14 MG/200ML-% (1.8 mg/mL) IV infusion, 30 mg/hr, Intravenous, Continuous, Elgergawy, Silver Huguenin, MD, Last Rate: 16.67 mL/hr at 11/13/21 1344, 30 mg/hr at 11/13/21 1344 ?  ceFEPIme (MAXIPIME) 1 g in sodium chloride 0.9 % 100 mL IVPB, 1 g, Intravenous, Daily, Levonne Spiller, RPH, Last Rate: 200 mL/hr at 11/12/21 1730, Infusion Verify at 11/12/21 1730 ?  [START ON 11/15/2021] ceFEPIme (MAXIPIME) 2 g in sodium chloride 0.9 % 100 mL IVPB, 2 g, Intravenous, Q M,W,F-HD, Elgergawy, Silver Huguenin, MD ?  Chlorhexidine Gluconate Cloth 2 % PADS 6 each, 6 each, Topical, Q0600, [name], MD, 6 each at 11/13/21 0533 ?  heparin injection 1,000 Units, 1,000 Units, Dialysis, PRN, [name], MD ?  heparin injection 1,500 Units, 1,500 Units, Dialysis, PRN, Roney Jaffe, MD ?  heparin injection 5,000 Units, 5,000 Units, Subcutaneous, Q8H, Elgergawy, Silver Huguenin, MD, 5,000 Units at 11/13/21 1707 ?  hydrOXYzine (ATARAX) tablet 25 mg, 25 mg, Oral, TID PRN, Vernelle Emerald, MD ?  levothyroxine (SYNTHROID) tablet 25 mcg, 25 mcg, Oral, Q0600, Rise Patience, MD, 25 mcg at 11/12/21 0516 ?  lidocaine (PF) (XYLOCAINE) 1 % injection 5 mL, 5 mL, Intradermal, PRN,  [name], MD ?  lidocaine-prilocaine (EMLA) cream 1 application., 1 application., Topical, PRN, [name], MD ?  LORazepam (ATIVAN) injection 0.5 mg, 0.5 mg, Intravenous, QHS, Elgergawy, Silver Huguenin, MD, 0.5 mg at 11/12/21 2204 ?  metoprolol tartrate (LOPRESSOR) injection 5 mg, 5 mg, Intravenous, Q8H, Skeet Latch, MD, 5 mg at 11/13/21 1343 ?  midodrine (PROAMATINE) tablet 5 mg, 5 mg, Oral, Once, Elgergawy, Silver Huguenin, MD ?  midodrine (PROAMATINE) tablet 5-10 mg, 5-10 mg, Oral, Q M,W,F-HD, Rise Patience, MD, 10 mg at 11/10/21 1202 ?  multivitamin (RENA-VIT) tablet 1 tablet, 1 tablet, Oral,  Daily, Rise Patience, MD, 1 tablet at 11/11/21 0900 ?  pentafluoroprop-tetrafluoroeth (GEBAUERS) aerosol 1 application., 1 application., Topical, PRN, [name], MD ?  potassium chloride (KLOR-CON M) CR tablet 30 mEq, 30 mEq, Oral, Once, Roney Jaffe, MD ?  rosuvastatin (CRESTOR) tablet 10 mg, 10 mg, Oral, Daily, Rise Patience, MD, 10 mg at 11/11/21 0900 ?  sevelamer carbonate (RENVELA) tablet 2,400 mg, 2,400 mg, Oral, TID WC, Rise Patience, MD, 2,400 mg at 11/11/21 1244 ?  tobramycin-dexamethasone (TOBRADEX) ophthalmic suspension 2 drop, 2 drop, Left EAR, Q6H, Little Ishikawa, MD, 2 drop at 11/13/21 1051 ? ?Exam: ?Current vital signs: ?BP 116/63 (BP Location: Right Arm)   Pulse (!) 115   Temp 97.8 ?F (36.6 ?C) (Oral)   Resp 19   Ht '5\' 1"'$  (1.549 m)   Wt 54.8 kg   SpO2 96%   BMI 22.83 kg/m?  ?Vital signs in last 24 hours: ?Temp:  [97.3 ?F (36.3 ?C)-97.8 ?F (36.6 ?C)] 97.8 ?F (36.6 ?C) (05/06 1640) ?Pulse Rate:  [100-125] 115 (05/06 1640) ?Resp:  [17-23] 19 (05/06 1640) ?BP: (101-148)/(58-96) 116/63 (05/06 1640) ? ?GENERAL: Awake, alert pleasantly confused elderly female in NAD ?HEENT: - Normocephalic and atraumatic, dry mm ?LUNGS - Clear to auscultation bilaterally with no wheezes ?CV - S1S2 RRR, no m/r/g, equal pulses bilaterally. ?ABDOMEN - Soft, nontender,  nondistended with normoactive BS ?Ext: warm, well perfused, intact peripheral pulses, no edema. LUE AVF. ? ?NEURO:  ?Mental Status: AA&O to self, Month and day of birth. Unable to state birth year. She states we are in [place]

## 2021-11-13 NOTE — Progress Notes (Signed)
? ?Progress Note ? ?Patient Name: Shawna [name] ?Date of Encounter: 11/13/2021 ? ?[facility] Cardiologist: Carlyle Dolly, MD  ? ?Subjective  ? ?Heart rate remains in the one teens. She is tolerating this. Continues on IV antibiotics. She remains altered.  ? ?Inpatient Medications  ?  ?Scheduled Meds: ? Chlorhexidine Gluconate Cloth  6 each Topical Q0600  ? heparin injection (subcutaneous)  5,000 Units Subcutaneous Q8H  ? levothyroxine  25 mcg Oral Q0600  ? LORazepam  0.5 mg Intravenous QHS  ? metoprolol tartrate  5 mg Intravenous Q8H  ? midodrine  5 mg Oral Once  ? midodrine  5-10 mg Oral Q M,W,F-HD  ? multivitamin  1 tablet Oral Daily  ? potassium chloride  30 mEq Oral Once  ? rosuvastatin  10 mg Oral Daily  ? sevelamer carbonate  2,400 mg Oral TID WC  ? tobramycin-dexamethasone  2 drop Left EAR Q6H  ? ?Continuous Infusions: ? sodium chloride    ? sodium chloride    ? sodium chloride Stopped (11/12/21 1719)  ? amiodarone 30 mg/hr (11/12/21 2342)  ? ceFEPime (MAXIPIME) IV 200 mL/hr at 11/12/21 1730  ? ?PRN Meds: ?sodium chloride, sodium chloride, sodium chloride, alteplase, heparin, heparin, hydrOXYzine, lidocaine (PF), lidocaine-prilocaine, pentafluoroprop-tetrafluoroeth  ? ?Vital Signs  ?  ?Vitals:  ? 11/12/21 2300 11/13/21 0311 11/13/21 0500 11/13/21 0800  ?BP: (!) 120/58 133/60 113/61 (!) 128/58  ?Pulse: (!) 111 (!) 116 (!) 119   ?Resp: '18 18  17  '$ ?Temp: 97.8 ?F (36.6 ?C) 97.8 ?F (36.6 ?C)    ?TempSrc: Axillary Axillary    ?SpO2:      ?Weight:      ?Height:      ? ? ?Intake/Output Summary (Last 24 hours) at 11/13/2021 1020 ?Last data filed at 11/12/2021 1730 ?Gross per 24 hour  ?Intake 313.28 ml  ?Output 500 ml  ?Net -186.72 ml  ? ? ?  11/12/2021  ?  4:20 PM 11/12/2021  ? 12:49 PM 11/11/2021  ?  5:40 PM  ?Last 3 Weights  ?Weight (lbs) 120 lb 13 oz 122 lb 12.7 oz 126 lb 12.2 oz  ?Weight (kg) 54.8 kg 55.7 kg 57.5 kg  ?   ? ?Telemetry  ?  ??Flutter LBBB - Personally Reviewed ? ?ECG  ?  ?NA - Personally  Reviewed ? ?Physical Exam  ? ?Vitals:  ? 11/13/21 0500 11/13/21 0800  ?BP: 113/61 (!) 128/58  ?Pulse: (!) 119   ?Resp:  17  ?Temp:    ?SpO2:    ? ? ?GEN: altered ?Neck: No JVD ?Cardiac: tachycardic, no murmurs, rubs, or gallops.  ?Respiratory: Clear to auscultation bilaterally. ?GI: Soft, nontender, non-distended  ?MS: No edema; No deformity. ?Neuro:  Nonfocal  ?Psych: NA ? ?Labs  ?  ?High Sensitivity Troponin:   ?Recent Labs  ?Lab 10/18/21 ?2217 10/19/21 ?0008 10/19/21 ?3785 10/19/21 ?8850 10/29/21 ?1631  ?TROPONINIHS 132* 132* 130* 126* 63*  ?   ?Chemistry ?Recent Labs  ?Lab 11/08/21 ?1800 11/08/21 ?2230 11/11/21 ?0701 11/12/21 ?2774 11/13/21 ?1287  ?NA 133*   < > 140 141 138  ?K 4.3   < > 3.3* 4.3 3.8  ?CL 98   < > 101 104 99  ?CO2 20*   < > '23 23 28  '$ ?GLUCOSE 129*   < > 110* 82 80  ?BUN 52*   < > 51* 17 11  ?CREATININE 9.72*   < > 8.21* 4.23* 2.95*  ?CALCIUM 9.1   < > 8.6* 9.2 9.4  ?  MG 1.9  --   --   --   --   ?GFRNONAA 4*   < > 5* 10* 15*  ?ANIONGAP 15   < > 16* 14 11  ? < > = values in this interval not displayed.  ?  ?Lipids No results for input(s): CHOL, TRIG, HDL, LABVLDL, LDLCALC, CHOLHDL in the last 168 hours.  ?Hematology ?Recent Labs  ?Lab 11/11/21 ?0701 11/12/21 ?2774 11/13/21 ?1287  ?WBC 8.7 7.3 7.9  ?RBC 2.96* 3.17* 3.16*  ?HGB 9.7* 10.4* 10.0*  ?HCT 29.2* 31.2* 31.7*  ?MCV 98.6 98.4 100.3*  ?MCH 32.8 32.8 31.6  ?MCHC 33.2 33.3 31.5  ?RDW 21.2* 20.9* 20.4*  ?PLT 160 PLATELET CLUMPS NOTED ON SMEAR, UNABLE TO ESTIMATE PLATELET CLUMPS NOTED ON SMEAR, UNABLE TO ESTIMATE  ? ?Thyroid No results for input(s): TSH, FREET4 in the last 168 hours.  ?BNP ?Recent Labs  ?Lab 11/08/21 ?1800  ?BNP 1,391.8*  ?  ?DDimer No results for input(s): DDIMER in the last 168 hours.  ? ?Radiology  ?  ?No results found. ? ?Cardiac Studies  ? ?TTE 07/16/2021 ? 1. Left ventricular ejection fraction, by estimation, is 30 to 35%. The  ?left ventricle has moderately decreased function. The left ventricle  ?demonstrates regional wall  motion abnormalities (see scoring  ?diagram/findings for description). There is mild  ?left ventricular hypertrophy. Left ventricular diastolic parameters are  ?consistent with Grade II diastolic dysfunction (pseudonormalization).  ?Elevated left atrial pressure.  ? 2. Right ventricular systolic function is moderately reduced. The right  ?ventricular size is normal. There is mildly elevated pulmonary artery  ?systolic pressure. The estimated right ventricular systolic pressure is  ?86.7 mmHg.  ? 3. Left atrial size was severely dilated.  ? 4. Right atrial size was mildly dilated.  ? 5. The mitral valve is degenerative. Moderate mitral valve regurgitation.  ?Mild to moderate mitral stenosis. MG 18mHg at 70bpm, MVA 1.2 cm^2 by  ?continuity equation. Moderate mitral annular calcification.  ? 6. Tricuspid valve regurgitation is mild to moderate.  ? 7. The aortic valve is tricuspid. Aortic valve regurgitation is not  ?visualized. Aortic valve sclerosis/calcification is present, without any  ?evidence of aortic stenosis.  ? 8. The inferior vena cava is normal in size with greater than 50%  ?respiratory variability, suggesting right atrial pressure of 3 mmHg. ? ?Patient Profile  ?   ?Shawna WATKINis a 81y.o. female with a hx of paroxysmal atrial fibrillation not on anticoagulation secondary to history of GI bleed, HFrEF, CAD s/p CABG and stent placement (Cath Jan 2023- stable disease), ESRD on hemodialysis on MWF, prior history of colon cancer, HLD who is being seen 11/12/2021 for the evaluation of atrial fibrillation at the request of Dr. EWaldron Labs here with AMS -delirium,  subacute mastoiditis versus otitis media on IV antibiotics ? ?Assessment & Plan  ?  ?Afib: rates one teens. ? 2:1 flutter with LBBB. Afib. In the setting of infection above. Also chronically ill on ESRD. She's not on AC 2/2 hx of GI bleed.  ?- continue IV amiodarone  ?- appears dry/ not taking PO intake well, recommend some fluids ?- Once rates are  more stable can convert amiodarone 400 mg BID , then amiodarone 200 mg daily thereafter (on 100 mg daily at home) ? ?Hx CAD s/p CABG. EF 30-35%. She appears mildly hypovolemic. ?- stable ?- UF per ESRD ?- continue crestor 10 mg daily ? ?Prolonged QTc: has LBBB block, can use JTc which is < 500  ms if antipsychotic necessary ? ? ? ?For questions or updates, please contact [place] ?Please consult [email or web address] for contact info under  ? ?  ?   ?Signed, ?Janina Mayo, MD  ?11/13/2021, 10:20 AM   ? ?

## 2021-11-13 NOTE — Progress Notes (Signed)
?PROGRESS NOTE ? ? ? ?Shawna [name]  OTL:[id number] DOB: 06/11/1941 DOA: 11/08/2021 ?PCP: [facility] ? ? ?Brief Narrative:  ? ?Shawna [name] is a 81 y.o. female with history of paroxysmal atrial fibrillation not on anticoagulation secondary to history of GI bleed, CAD status post CABG and stent placement, ESRD on hemodialysis on Monday Wednesday Friday, prior history of colon cancer, hyperlipidemia was brought to the ER after patient was found to be increasingly confused and weak over the last 24 hours.   ? ?Of note approximately 10 days prior to admission patient presented to the ED with left earache with external otitis media and mastoiditis placed on oral antibiotics at that time.  ? ?Assessment & Plan: ?  ?Principal Problem: ?  Acute encephalopathy ?Active Problems: ?  Hx of CABG ?  Chronic systolic heart failure ([place]) ?  Essential hypertension ?  History of colon cancer ?  History of ovarian cancer ?  ESRD (end stage renal disease) on dialysis [facility]) ?  PAF (paroxysmal atrial fibrillation) ([place]) ?  Acute metabolic encephalopathy ? ? ?Acute metabolic encephalopathy, POA ?Hospital delirium ?-Multifactorial ?- possibly related to polypharmacy,  gabapentin, alprazolam and oxycodone on hold since admission ?-Continue to treat infectious etiology with subacute mastoiditis /malignant otitis external ?-As well possibly related to uremia for which she was dialyzed ?-No acute finding on MRI brain, but she is with extensive chronic small vessel ischemia, generalized brain atrophy which indicates some underlining vascular dementia. ?-Patient appears to be more confused today, or agitated and restless today, this is most likely related to worsening hospital delirium, but as well she was on long-term on her alprazolam and oxycodone, so might be some withdrawal factors, she will be kept on scheduled low-dose IV Ativan and Dilaudid to see if it helps in case she is in withdrawals.   ?-Consider antipsychotic if needed  for agitation as QTc is prolonged secondary to left bundle branch block, and her JTC<500, cardiology input appreciated. ?-I have discussed with her husband, she does not have any underlying psychiatric diagnosis. ?-EEG significant for rhythmic discharges with triphasic morphology, generalized, no seizures were seen throughout the recording, will discuss with neurology importance about these findings. ? ?Subacute mastoiditis/otitis externa ?-Previously treated outpatient with improving symptoms per patient and family (no longer having ear pain) ?-ENT input greatly appreciated, she has a wick placed, and started on TobraDex, initially on IV Rocephin, changed to IV cefepime, urine cultures growing Pseudomonas, on IV cefepime for now, timing per ENT.   ?-Discussed with Dr. Melene Plan, no need to remove wick as it will fall and swelling has improved, continue with TobraDex x10 days, continue with IV cefepime x3 weeks ? ?ESRD Monday Wednesday Friday ?-Missed dialysis Monday. ?- HD per renal  ? ?History of paroxysmal atrial fibrillation, uncontrolled, possible a flutter ?-  not on anticoagulation secondary to GI bleed  ?-cardiology input greatly appreciated, can transition to p.o. amiodarone once heart rate is more stable. ?- QTc is prolonged secondary to left bundle branch block, and her JTC<500, cardiology input appreciated. ? ?History of CAD status post CABG  ?-continue home statin  ? ?Anemia of chronic kidney disease  ?- EPO, IV iron per renal.   ?-Received 2 units PRBC 5/3 for hemoglobin of 6.3, globin has been stable since transfusion. ?Hypokalemia ?-Management with HD ? ?Hypertension ?-Blood pressure well controlled currently ? ?History of colon cancer and ovarian cancer. -Completed previous therapy ?   ?DVT prophylaxis: Subcu heparin ?Code Status: Full code. ?Family Communication:  None at bedside, discussed with husband by phone 5/4, 5/5. ? ?Status is: Inpatient ? ?Dispo: The patient is from: Home  ?             Patient  currently not medically stable for discharge, will need SNF placement, remains confused, on amiodarone drip. ? ?Consultants:  ?Nephrology, ENT ? ?Procedures:  ?None ? ? ?Subjective: ? ?Patient remains confused, unable to provide any reliable complaints, she is with poor appetite, has been refusing her meds as discussed with staff. ? ?Objective: ?Vitals:  ? 11/12/21 2300 11/13/21 0311 11/13/21 0500 11/13/21 0800  ?BP: (!) 120/58 133/60 113/61 (!) 128/58  ?Pulse: (!) 111 (!) 116 (!) 119   ?Resp: '18 18  17  '$ ?Temp: 97.8 ?F (36.6 ?C) 97.8 ?F (36.6 ?C)    ?TempSrc: Axillary Axillary    ?SpO2:      ?Weight:      ?Height:      ? ? ?Intake/Output Summary (Last 24 hours) at 11/13/2021 1121 ?Last data filed at 11/12/2021 1730 ?Gross per 24 hour  ?Intake 313.28 ml  ?Output 500 ml  ?Net -186.72 ml  ? ?Filed Weights  ? 11/11/21 1740 11/12/21 1249 11/12/21 1620  ?Weight: 57.5 kg 55.7 kg 54.8 kg  ? ? ?Examination: ? ?Awake, confused, restless, incoherent significantly impaired cognition and insight. ?Awake Alert, Oriented X 3, No new F.N deficits, Normal affect ?Symmetrical Chest wall movement, Good air movement bilaterally, CTAB ?Tachycardic, no Gallops,Rubs or new Murmurs, No Parasternal Heave ?+ve B.Sounds, Abd Soft, No tenderness, No rebound - guarding or rigidity. ?No Cyanosis, Clubbing or edema, No new Rash or bruise   ? ? ? ?Data Reviewed: I have personally reviewed following labs and imaging studies ? ?CBC: ?Recent Labs  ?Lab 11/09/21 ?0420 11/10/21 ?1497 11/11/21 ?0701 11/12/21 ?0263 11/13/21 ?7858  ?WBC 12.1* 7.7 8.7 7.3 7.9  ?HGB 7.0* 6.3* 9.7* 10.4* 10.0*  ?HCT 21.8* 19.3* 29.2* 31.2* 31.7*  ?MCV 109.5* 106.6* 98.6 98.4 100.3*  ?PLT PLATELET CLUMPS NOTED ON SMEAR, UNABLE TO ESTIMATE PLATELET CLUMPS NOTED ON SMEAR, UNABLE TO ESTIMATE 160 PLATELET CLUMPS NOTED ON SMEAR, UNABLE TO ESTIMATE PLATELET CLUMPS NOTED ON SMEAR, UNABLE TO ESTIMATE  ? ?Basic Metabolic Panel: ?Recent Labs  ?Lab 11/08/21 ?1800 11/08/21 ?2230  11/09/21 ?0420 11/10/21 ?0120 11/11/21 ?0701 11/12/21 ?8502 11/13/21 ?7741  ?NA 133*   < > 133* 139 140 141 138  ?K 4.3   < > 4.0 3.2* 3.3* 4.3 3.8  ?CL 98  --  97* 99 101 104 99  ?CO2 20*  --  20* '23 23 23 28  '$ ?GLUCOSE 129*  --  110* 107* 110* 82 80  ?BUN 52*  --  61* 32* 51* 17 11  ?CREATININE 9.72*   < > 10.90* 5.91* 8.21* 4.23* 2.95*  ?CALCIUM 9.1  --  9.0 8.4* 8.6* 9.2 9.4  ?MG 1.9  --   --   --   --   --   --   ? < > = values in this interval not displayed.  ? ?GFR: ?Estimated Creatinine Clearance: 11.3 mL/min (A) (by C-G formula based on SCr of 2.95 mg/dL (H)). ?Liver Function Tests: ?No results for input(s): AST, ALT, ALKPHOS, BILITOT, PROT, ALBUMIN in the last 168 hours. ?No results for input(s): LIPASE, AMYLASE in the last 168 hours. ?Recent Labs  ?Lab 11/08/21 ?1800  ?AMMONIA 33  ? ?Coagulation Profile: ?Recent Labs  ?Lab 11/08/21 ?1800  ?INR 1.3*  ? ?Cardiac Enzymes: ?No results for input(s): CKTOTAL, CKMB,  CKMBINDEX, TROPONINI in the last 168 hours. ?BNP (last 3 results) ?No results for input(s): PROBNP in the last 8760 hours. ?HbA1C: ?No results for input(s): HGBA1C in the last 72 hours. ?CBG: ?Recent Labs  ?Lab 11/08/21 ?1657 11/12/21 ?1930  ?GLUCAP 103* 83  ? ?Lipid Profile: ?No results for input(s): CHOL, HDL, LDLCALC, TRIG, CHOLHDL, LDLDIRECT in the last 72 hours. ?Thyroid Function Tests: ?No results for input(s): TSH, T4TOTAL, FREET4, T3FREE, THYROIDAB in the last 72 hours. ?Anemia Panel: ?No results for input(s): VITAMINB12, FOLATE, FERRITIN, TIBC, IRON, RETICCTPCT in the last 72 hours. ?Sepsis Labs: ?No results for input(s): PROCALCITON, LATICACIDVEN in the last 168 hours. ? ?Recent Results (from the past 240 hour(s))  ?Blood culture (routine x 2)     Status: None  ? Collection Time: 11/08/21  6:00 PM  ? Specimen: BLOOD  ?Result Value Ref Range Status  ? Specimen Description BLOOD  Final  ? Special Requests   Final  ?  BOTTLES DRAWN AEROBIC AND ANAEROBIC Blood Culture results may not be optimal  due to an inadequate volume of blood received in culture bottles RT EJ  ? Culture   Final  ?  NO GROWTH 5 DAYS ?Performed at [facility], [place] [address] [place], [place] [address] ?  ? Report Status 05/06

## 2021-11-13 NOTE — Procedures (Signed)
Patient Name: Shawna [name]  ?MRN: [id number]  ?Epilepsy Attending: Lora Havens  ?Referring Physician/Provider: Albertine Patricia, MD ?Date: 11/13/2021 ?Duration: 21.10 mins ? ?Patient history: 81yo F with ams. EEG to evaluate for seizure ? ?Level of alertness: Awake ? ?AEDs during EEG study: Ativan ? ?Technical aspects: This EEG study was done with scalp electrodes positioned according to the 10-20 International system of electrode placement. Electrical activity was acquired at a sampling rate of '500Hz'$  and reviewed with a high frequency filter of '70Hz'$  and a low frequency filter of '1Hz'$ . EEG data were recorded continuously and digitally stored.  ? ?Description: No posterior dominant rhythm was seen. EEG showed continuous generalized 3 to 6 Hz theta-delta slowing. Generalized periodic discharges with triphasic morphology at 1-'2Hz'$  were also noted, more prominent when awake/stimulated. Hyperventilation and photic stimulation were not performed.    ? ?ABNORMALITY ?- Periodic discharges with triphasic morphology, generalized ( GPDs) ?- Continuous slow, generalized ? ?IMPRESSION: ?This study showed generalized periodic discharges with triphasic morphology at  1-'2Hz'$  which can be on the ictal-interictal continuum. However, the morphology, frequency and reactivity to stimulation is more likely indicative of toxic-metabolic causes. Additionally there is moderate diffuse encephalopathy, nonspecific etiology. No seizures were seen throughout the recording. ? ?Lora Havens  ? ?

## 2021-11-13 NOTE — Progress Notes (Signed)
EEG complete - results pending.  ? ?*No Internet access in room. Will have to move study file to server once I have Internet.  ?

## 2021-11-14 DIAGNOSIS — I480 Paroxysmal atrial fibrillation: Secondary | ICD-10-CM | POA: Diagnosis not present

## 2021-11-14 DIAGNOSIS — H6092 Unspecified otitis externa, left ear: Secondary | ICD-10-CM | POA: Diagnosis not present

## 2021-11-14 DIAGNOSIS — N186 End stage renal disease: Secondary | ICD-10-CM | POA: Diagnosis not present

## 2021-11-14 DIAGNOSIS — G9340 Encephalopathy, unspecified: Secondary | ICD-10-CM | POA: Diagnosis not present

## 2021-11-14 LAB — CBC
HCT: 32.2 % — ABNORMAL LOW (ref 36.0–46.0)
Hemoglobin: 10.1 g/dL — ABNORMAL LOW (ref 12.0–15.0)
MCH: 32.1 pg (ref 26.0–34.0)
MCHC: 31.4 g/dL (ref 30.0–36.0)
MCV: 102.2 fL — ABNORMAL HIGH (ref 80.0–100.0)
Platelets: 187 10*3/uL (ref 150–400)
RBC: 3.15 MIL/uL — ABNORMAL LOW (ref 3.87–5.11)
RDW: 20.4 % — ABNORMAL HIGH (ref 11.5–15.5)
WBC: 7.7 10*3/uL (ref 4.0–10.5)
nRBC: 0 % (ref 0.0–0.2)

## 2021-11-14 LAB — BASIC METABOLIC PANEL
Anion gap: 16 — ABNORMAL HIGH (ref 5–15)
BUN: 26 mg/dL — ABNORMAL HIGH (ref 8–23)
CO2: 22 mmol/L (ref 22–32)
Calcium: 9.1 mg/dL (ref 8.9–10.3)
Chloride: 101 mmol/L (ref 98–111)
Creatinine, Ser: 5.09 mg/dL — ABNORMAL HIGH (ref 0.44–1.00)
GFR, Estimated: 8 mL/min — ABNORMAL LOW (ref >60)
Glucose, Bld: 92 mg/dL (ref 70–99)
Potassium: 4.3 mmol/L (ref 3.5–5.1)
Sodium: 139 mmol/L (ref 135–145)

## 2021-11-14 LAB — RPR: RPR Ser Ql: NONREACTIVE

## 2021-11-14 MED ORDER — PIPERACILLIN-TAZOBACTAM IN DEX 2-0.25 GM/50ML IV SOLN
2.2500 g | Freq: Three times a day (TID) | INTRAVENOUS | Status: DC
  Administered 2021-11-14 – 2021-11-22 (×24): 2.25 g via INTRAVENOUS
  Filled 2021-11-14 (×28): qty 50

## 2021-11-14 MED ORDER — AMIODARONE HCL 200 MG PO TABS: 400.0000 mg | ORAL_TABLET | Freq: Two times a day (BID) | ORAL | Status: DC

## 2021-11-14 MED ORDER — SODIUM CHLORIDE 0.9 % IV SOLN: 100.0000 mL | INTRAVENOUS | Status: DC | PRN

## 2021-11-14 MED ORDER — SODIUM CHLORIDE 0.9 % IV SOLN
INTRAVENOUS | Status: AC
Start: 2021-11-14 — End: 2021-11-15

## 2021-11-14 MED ORDER — SODIUM CHLORIDE 0.9 % IV SOLN
12.5000 mg | Freq: Four times a day (QID) | INTRAVENOUS | Status: DC | PRN
  Administered 2021-11-14: 12.5 mg via INTRAVENOUS
  Filled 2021-11-14 (×2): qty 0.25

## 2021-11-14 MED ORDER — SODIUM CHLORIDE 0.9 % IV SOLN
1.0000 g | Freq: Once | INTRAVENOUS | Status: DC
  Filled 2021-11-14: qty 1

## 2021-11-14 MED ORDER — AMIODARONE HCL IN DEXTROSE 360-4.14 MG/200ML-% IV SOLN
30.0000 mg/h | INTRAVENOUS | Status: DC
  Administered 2021-11-14 – 2021-11-15 (×2): 30 mg/h via INTRAVENOUS
  Filled 2021-11-14 (×2): qty 200

## 2021-11-14 MED ORDER — CYANOCOBALAMIN 1000 MCG/ML IJ SOLN
1000.0000 ug | Freq: Every day | INTRAMUSCULAR | Status: AC
  Administered 2021-11-14 – 2021-11-20 (×5): 1000 ug via INTRAMUSCULAR
  Filled 2021-11-14 (×7): qty 1

## 2021-11-14 MED ORDER — SODIUM CHLORIDE 0.9 % IV SOLN: 2.0000 g | INTRAVENOUS | Status: DC

## 2021-11-14 MED ORDER — CYANOCOBALAMIN 1000 MCG/ML IJ SOLN: 1000.0000 ug | INTRAMUSCULAR | Status: DC

## 2021-11-14 NOTE — Plan of Care (Signed)
°  Problem: Education: °Goal: Knowledge of General Education information will improve °Description: Including pain rating scale, medication(s)/side effects and non-pharmacologic comfort measures °Outcome: Progressing °  °Problem: Health Behavior/Discharge Planning: °Goal: Ability to manage health-related needs will improve °Outcome: Progressing °  °Problem: Clinical Measurements: °Goal: Ability to maintain clinical measurements within normal limits will improve °Outcome: Progressing °Goal: Will remain free from infection °Outcome: Progressing °Goal: Diagnostic test results will improve °Outcome: Progressing °Goal: Respiratory complications will improve °Outcome: Progressing °Goal: Cardiovascular complication will be avoided °Outcome: Progressing °  °Problem: Activity: °Goal: Risk for activity intolerance will decrease °Outcome: Progressing °  °Problem: Nutrition: °Goal: Adequate nutrition will be maintained °Outcome: Progressing °  °Problem: Coping: °Goal: Level of anxiety will decrease °Outcome: Progressing °  °Problem: Elimination: °Goal: Will not experience complications related to bowel motility °Outcome: Progressing °Goal: Will not experience complications related to urinary retention °Outcome: Progressing °  °Problem: Pain Managment: °Goal: General experience of comfort will improve °Outcome: Progressing °  °Problem: Safety: °Goal: Ability to remain free from injury will improve °Outcome: Progressing °  °Problem: Skin Integrity: °Goal: Risk for impaired skin integrity will decrease °Outcome: Progressing °  °Problem: Safety: °Goal: Non-violent Restraint(s) °Outcome: Progressing °  °

## 2021-11-14 NOTE — Progress Notes (Signed)
Pharmacy Antibiotic Note ? ?Shawna [name] is a 81 y.o. female admitted on 11/08/2021 with malignant ostitis externa. Ear culture from 11/09/21 with pseudomonas aeruginosa (R imipenem, S ceftazidime and cefepime). Patient has been on cefepime since 11/10/21 with plans to continue until 12/01/21. Pharmacy has been consulted to narrow antibiotics and dose ceftazidime. ? ?Plan: ?Ceftazidime 1g x 1 tonight then transition to Ceftazidime 2g IV qHD on MWF ?F/u HD schedule and length of therapy ? ?Height: '5\' 1"'$  (154.9 cm) ?Weight: 54.8 kg (120 lb 13 oz) ?IBW/kg (Calculated) : 47.8 ? ?Temp (24hrs), Avg:97.8 ?F (36.6 ?C), Min:97.3 ?F (36.3 ?C), Max:98 ?F (36.7 ?C) ? ?Recent Labs  ?Lab 11/10/21 ?0120 11/10/21 ?6144 11/11/21 ?0701 11/12/21 ?3154 11/13/21 ?0086 11/14/21 ?0017  ?WBC  --  7.7 8.7 7.3 7.9 7.7  ?CREATININE 5.91*  --  8.21* 4.23* 2.95* 5.09*  ?  ?Estimated Creatinine Clearance: 6.5 mL/min (A) (by C-G formula based on SCr of 5.09 mg/dL (H)).   ? ?Allergies  ?Allergen Reactions  ? Amlodipine Swelling  ? Aspirin Other (See Comments)  ?  High Doses Mess up her stomach; "makes my bowels have blood in them". ?Takes 81 mg EC Aspirin   ? Nitrofurantoin Hives  ? Penicillins Other (See Comments)  ?  SYNCOPE? , "makes me real weak when I take it; like I'll pass out" ? ?Has patient had a PCN reaction causing immediate rash, facial/tongue/throat swelling, SOB or lightheadedness with hypotension: Yes ?Has patient had a PCN reaction causing severe rash involving mucus membranes or skin necrosis: no ?Has patient had a PCN reaction that required hospitalization no ?Has patient had a PCN reaction occurring within the last 10 years: no ?If all of the above ?  ? Bactrim [Sulfamethoxazole-Trimethoprim] Rash  ? Contrast Media [Iodinated Contrast Media] Itching  ? Iron Itching and Other (See Comments)  ?  "they gave me iron in dialysis; had to give me Benadryl cause I had to have the iron" (05/02/2012)  ? Tylenol [Acetaminophen] Itching and Other  (See Comments)  ?  Makes her feet on fire per pt  ? Gabapentin Other (See Comments)  ?  Unknown reaction  ? Iron Sucrose Other (See Comments)  ?  Unknown  ? Ranexa [Ranolazine] Other (See Comments)  ?  Myoclonus-hospitalized   ? Sucroferric Oxyhydroxide Other (See Comments)  ?  Unknown  ? Dexilant [Dexlansoprazole] Other (See Comments)  ?  Upset stomach  ? Hydralazine Itching  ?  Has tolerated while inpatient ?Has tolerated while inpatient  ? Levaquin [Levofloxacin In D5w] Rash  ? Morphine And Related Itching and Other (See Comments)  ?  Itching in feet  ? Pantoprazole Rash  ? Plavix [Clopidogrel Bisulfate] Rash  ? Protonix [Pantoprazole Sodium] Rash  ? Venofer [Ferric Oxide] Itching and Other (See Comments)  ?  Patient reports using Benadryl prior to doses as [place]  ? ? ?Antimicrobials this admission: ?Cefepime 5/3 > 5/6 ?Ceftazidime 5/7 > ? ?Dose adjustments this admission: ? ?Microbiology results: ?5/1 BCx: neg ?5/2 Ear Cx: pseudomonas aeruginosa (R imipenem, S ceftazidime and cefepime) ? ?Thank you for allowing pharmacy to participate in this patient's care. ? ?Levonne Spiller, PharmD ?PGY1 Acute Care Resident  ?11/14/2021,12:42 PM ? ? ?

## 2021-11-14 NOTE — Progress Notes (Signed)
? ?Progress Note ? ?Patient Name: Shawna [name] ?Date of Encounter: 11/14/2021 ? ?[facility] Cardiologist: Carlyle Dolly, MD  ? ?Subjective  ? ?Her rates are improved, in sinus occasional tachycardic atrial arrhythmia.  Continues on IV antibiotics.  ? ?Inpatient Medications  ?  ?Scheduled Meds: ? Chlorhexidine Gluconate Cloth  6 each Topical Q0600  ? cyanocobalamin  1,000 mcg Intramuscular Q0600  ? Followed by  ? [START ON 11/21/2021] cyanocobalamin  1,000 mcg Intramuscular Weekly  ? heparin injection (subcutaneous)  5,000 Units Subcutaneous Q8H  ? levothyroxine  25 mcg Oral Q0600  ? LORazepam  0.5 mg Intravenous QHS  ? metoprolol tartrate  5 mg Intravenous Q8H  ? midodrine  5 mg Oral Once  ? midodrine  5-10 mg Oral Q M,W,F-HD  ? multivitamin  1 tablet Oral Daily  ? potassium chloride  30 mEq Oral Once  ? rosuvastatin  10 mg Oral Daily  ? sevelamer carbonate  2,400 mg Oral TID WC  ? tobramycin-dexamethasone  2 drop Left EAR Q6H  ? ?Continuous Infusions: ? sodium chloride    ? sodium chloride    ? sodium chloride Stopped (11/12/21 1719)  ? amiodarone 30 mg/hr (11/14/21 0120)  ? ?PRN Meds: ?sodium chloride, sodium chloride, sodium chloride, alteplase, heparin, heparin, hydrOXYzine, lidocaine (PF), lidocaine-prilocaine, pentafluoroprop-tetrafluoroeth  ? ?Vital Signs  ?  ?Vitals:  ? 11/13/21 2331 11/14/21 0317 11/14/21 0530 11/14/21 0846  ?BP: 101/63 130/60 120/85 140/61  ?Pulse: (!) 105 (!) 111 (!) 117 (!) 56  ?Resp: '20 16  15  '$ ?Temp: 97.8 ?F (36.6 ?C) 97.8 ?F (36.6 ?C)  98 ?F (36.7 ?C)  ?TempSrc: Axillary Axillary  Oral  ?SpO2:      ?Weight:      ?Height:      ? ? ?Intake/Output Summary (Last 24 hours) at 11/14/2021 1107 ?Last data filed at 11/14/2021 0400 ?Gross per 24 hour  ?Intake 300.03 ml  ?Output --  ?Net 300.03 ml  ? ? ?  11/12/2021  ?  4:20 PM 11/12/2021  ? 12:49 PM 11/11/2021  ?  5:40 PM  ?Last 3 Weights  ?Weight (lbs) 120 lb 13 oz 122 lb 12.7 oz 126 lb 12.2 oz  ?Weight (kg) 54.8 kg 55.7 kg 57.5 kg  ?    ? ?Telemetry  ?  ?Sinus rhythm, ?Flutter LBBB - Personally Reviewed ? ?ECG  ?  ?NA - Personally Reviewed ? ?Physical Exam  ? ?Vitals:  ? 11/14/21 0530 11/14/21 0846  ?BP: 120/85 140/61  ?Pulse: (!) 117 (!) 56  ?Resp:  15  ?Temp:  98 ?F (36.7 ?C)  ?SpO2:    ? ? ?Getting her EEG ?Resting comfortable ?Nl wob ?No distended abd ?No ext edema ? ? ?Labs  ?  ?High Sensitivity Troponin:   ?Recent Labs  ?Lab 10/18/21 ?2217 10/19/21 ?0008 10/19/21 ?6237 10/19/21 ?6283 10/29/21 ?1631  ?TROPONINIHS 132* 132* 130* 126* 63*  ?   ?Chemistry ?Recent Labs  ?Lab 11/08/21 ?1800 11/08/21 ?2230 11/12/21 ?1517 11/13/21 ?6160 11/14/21 ?0017  ?NA 133*   < > 141 138 139  ?K 4.3   < > 4.3 3.8 4.3  ?CL 98   < > 104 99 101  ?CO2 20*   < > '23 28 22  '$ ?GLUCOSE 129*   < > 82 80 92  ?BUN 52*   < > 17 11 26*  ?CREATININE 9.72*   < > 4.23* 2.95* 5.09*  ?CALCIUM 9.1   < > 9.2 9.4 9.1  ?MG 1.9  --   --   --   --   ?  GFRNONAA 4*   < > 10* 15* 8*  ?ANIONGAP 15   < > 14 11 16*  ? < > = values in this interval not displayed.  ?  ?Lipids No results for input(s): CHOL, TRIG, HDL, LABVLDL, LDLCALC, CHOLHDL in the last 168 hours.  ?Hematology ?Recent Labs  ?Lab 11/12/21 ?1017 11/13/21 ?5102 11/14/21 ?0017  ?WBC 7.3 7.9 7.7  ?RBC 3.17* 3.16* 3.15*  ?HGB 10.4* 10.0* 10.1*  ?HCT 31.2* 31.7* 32.2*  ?MCV 98.4 100.3* 102.2*  ?MCH 32.8 31.6 32.1  ?MCHC 33.3 31.5 31.4  ?RDW 20.9* 20.4* 20.4*  ?PLT PLATELET CLUMPS NOTED ON SMEAR, UNABLE TO ESTIMATE PLATELET CLUMPS NOTED ON SMEAR, UNABLE TO ESTIMATE 187  ? ?Thyroid No results for input(s): TSH, FREET4 in the last 168 hours.  ?BNP ?Recent Labs  ?Lab 11/08/21 ?1800  ?BNP 1,391.8*  ?  ?DDimer No results for input(s): DDIMER in the last 168 hours.  ? ?Radiology  ?  ?EEG adult ? ?Result Date: 11/13/2021 ?Lora Havens, MD     11/13/2021 10:51 AM Patient Name: Shawna [name] MRN: [id number] Epilepsy Attending: Lora Havens Referring Physician/Provider: Albertine Patricia, MD Date: 11/13/2021 Duration: 21.10 mins Patient  history: 81yo F with ams. EEG to evaluate for seizure Level of alertness: Awake AEDs during EEG study: Ativan Technical aspects: This EEG study was done with scalp electrodes positioned according to the 10-20 International system of electrode placement. Electrical activity was acquired at a sampling rate of '500Hz'$  and reviewed with a high frequency filter of '70Hz'$  and a low frequency filter of '1Hz'$ . EEG data were recorded continuously and digitally stored. Description: No posterior dominant rhythm was seen. EEG showed continuous generalized 3 to 6 Hz theta-delta slowing. Generalized periodic discharges with triphasic morphology at 1-'2Hz'$  were also noted, more prominent when awake/stimulated. Hyperventilation and photic stimulation were not performed.   ABNORMALITY - Periodic discharges with triphasic morphology, generalized ( GPDs) - Continuous slow, generalized IMPRESSION: This study showed generalized periodic discharges with triphasic morphology at  1-'2Hz'$  which can be on the ictal-interictal continuum. However, the morphology, frequency and reactivity to stimulation is more likely indicative of toxic-metabolic causes. Additionally there is moderate diffuse encephalopathy, nonspecific etiology. No seizures were seen throughout the recording. [name]  ? ?Overnight EEG with video ? ?Result Date: 11/14/2021 ?Lora Havens, MD     11/14/2021  9:13 AM Patient Name: Shawna [name] MRN: [id number] Epilepsy Attending: Lora Havens Referring Physician/Provider:  August Albino, NP Duration: 11/13/2021 1747 to 11/14/2021 0900  Patient history: 81yo F with ams. EEG to evaluate for seizure  Level of alertness: Awake, asleep  AEDs during EEG study: None  Technical aspects: This EEG study was done with scalp electrodes positioned according to the 10-20 International system of electrode placement. Electrical activity was acquired at a sampling rate of '500Hz'$  and reviewed with a high frequency filter of '70Hz'$  and a low frequency  filter of '1Hz'$ . EEG data were recorded continuously and digitally stored.  Description: No posterior dominant rhythm was seen. Sleep was characterized by vertex waves, sleep spindles (12-'14hz'$ ), maximal frontocentral region. EEG showed continuous generalized 3 to 6 Hz theta-delta slowing. Generalized periodic discharges with triphasic morphology at 1-'2Hz'$  were also noted, more prominent when awake/stimulated. Hyperventilation and photic stimulation were not performed.    ABNORMALITY - Periodic discharges with triphasic morphology, generalized ( GPDs) - Continuous slow, generalized  IMPRESSION: This study showed generalized periodic discharges with triphasic morphology at  1-'2Hz'$  which can  be on the ictal-interictal continuum. However, the morphology, frequency and reactivity to stimulation is more likely indicative of toxic-metabolic causes. Additionally there is moderate diffuse encephalopathy, nonspecific etiology. No seizures were seen throughout the recording.  [name]    ? ?Cardiac Studies  ? ?TTE 07/16/2021 ? 1. Left ventricular ejection fraction, by estimation, is 30 to 35%. The  ?left ventricle has moderately decreased function. The left ventricle  ?demonstrates regional wall motion abnormalities (see scoring  ?diagram/findings for description). There is mild  ?left ventricular hypertrophy. Left ventricular diastolic parameters are  ?consistent with Grade II diastolic dysfunction (pseudonormalization).  ?Elevated left atrial pressure.  ? 2. Right ventricular systolic function is moderately reduced. The right  ?ventricular size is normal. There is mildly elevated pulmonary artery  ?systolic pressure. The estimated right ventricular systolic pressure is  ?60.1 mmHg.  ? 3. Left atrial size was severely dilated.  ? 4. Right atrial size was mildly dilated.  ? 5. The mitral valve is degenerative. Moderate mitral valve regurgitation.  ?Mild to moderate mitral stenosis. MG 23mHg at 70bpm, MVA 1.2 cm^2 by   ?continuity equation. Moderate mitral annular calcification.  ? 6. Tricuspid valve regurgitation is mild to moderate.  ? 7. The aortic valve is tricuspid. Aortic valve regurgitation is not  ?visualized. Aortic valve sclerosi

## 2021-11-14 NOTE — Progress Notes (Signed)
LTM EEG discontinued - no skin breakdown at unhook.   

## 2021-11-14 NOTE — Progress Notes (Signed)
Pharmacy Antibiotic Note ? ?Shawna [name] is a 81 y.o. female admitted on 11/08/2021 with malignant ostitis externa. Ear culture from 11/09/21 with pseudomonas aeruginosa (R imipenem, S ceftazidime and cefepime). Patient has been on cefepime since 11/10/21 with plans to continue until 12/01/21. Pharmacy has been consulted earlier today for change to Ceftazidime, but Dr. Waldron Labs would like to avoid Ceftazidime as well, for possible potential to contribute to altered mental status. Had not yet received Ceftazidime. Reviewed antibiotic history in [facility].  PCN allergy listed since 2013, but she did receive Zosyn for a week during admit 05/2016. MD would like to change to Zosyn. ? ?Plan: ?Zosyn 2.25 gm IV q8hr. ?Antibiotic stop time 12/01/21. ?Will follow up antibiotic plans and for any changes to length of therapy. ? ?Height: '5\' 1"'$  (154.9 cm) ?Weight: 54.8 kg (120 lb 13 oz) ?IBW/kg (Calculated) : 47.8 ? ?Temp (24hrs), Avg:97.7 ?F (36.5 ?C), Min:97.4 ?F (36.3 ?C), Max:98 ?F (36.7 ?C) ? ?Recent Labs  ?Lab 11/10/21 ?0120 11/10/21 ?0109 11/11/21 ?0701 11/12/21 ?3235 11/13/21 ?5732 11/14/21 ?0017  ?WBC  --  7.7 8.7 7.3 7.9 7.7  ?CREATININE 5.91*  --  8.21* 4.23* 2.95* 5.09*  ?  ?Estimated Creatinine Clearance: 6.5 mL/min (A) (by C-G formula based on SCr of 5.09 mg/dL (H)).   ? ?Allergies  ?Allergen Reactions  ? Amlodipine Swelling  ? Aspirin Other (See Comments)  ?  High Doses Mess up her stomach; "makes my bowels have blood in them". ?Takes 81 mg EC Aspirin   ? Nitrofurantoin Hives  ? Penicillins Other (See Comments)  ?  11/2021: tolerated Zosyn x 1 wk (2017) ? ?SYNCOPE? , "makes me real weak when I take it; like I'll pass out" ? ?Has patient had a PCN reaction causing immediate rash, facial/tongue/throat swelling, SOB or lightheadedness with hypotension: Yes ?Has patient had a PCN reaction causing severe rash involving mucus membranes or skin necrosis: no ?Has patient had a PCN reaction that required hospitalization no ?Has patient  had a PCN reaction occurring within the last 10 years: no ?If all of the above ?  ? Bactrim [Sulfamethoxazole-Trimethoprim] Rash  ? Contrast Media [Iodinated Contrast Media] Itching  ? Iron Itching and Other (See Comments)  ?  "they gave me iron in dialysis; had to give me Benadryl cause I had to have the iron" (05/02/2012)  ? Tylenol [Acetaminophen] Itching and Other (See Comments)  ?  Makes her feet on fire per pt  ? Gabapentin Other (See Comments)  ?  Unknown reaction  ? Iron Sucrose Other (See Comments)  ?  Unknown  ? Ranexa [Ranolazine] Other (See Comments)  ?  Myoclonus-hospitalized   ? Sucroferric Oxyhydroxide Other (See Comments)  ?  Unknown  ? Dexilant [Dexlansoprazole] Other (See Comments)  ?  Upset stomach  ? Hydralazine Itching  ?  Has tolerated while inpatient ?Has tolerated while inpatient  ? Levaquin [Levofloxacin In D5w] Rash  ? Morphine And Related Itching and Other (See Comments)  ?  Itching in feet  ? Pantoprazole Rash  ? Plavix [Clopidogrel Bisulfate] Rash  ? Protonix [Pantoprazole Sodium] Rash  ? Venofer [Ferric Oxide] Itching and Other (See Comments)  ?  Patient reports using Benadryl prior to doses as [place]  ? ? ?Antimicrobials this admission: ?Ceftriaxone  5/1 >> 5/2 ?Cefepime 5/3 >> 5/6 ?Tobradex 5/2  >> ?Zosyn 5/7 >> (5/24) ? ?Microbiology results: ?5/2 left ear: rare Pseudomonas - sensitive to Cefazidime, Cefepime, Cipro, Gentamicin, Zosyn.  Resistant to Imipenem ?5/1 blood:  negative ? ?Thank you for allowing pharmacy to be a part of this patient?s care. ? ?Arty Baumgartner, [place] ?11/14/2021 5:15 PM ? ?

## 2021-11-14 NOTE — Progress Notes (Signed)
Subjective: ?Continues to be confused ? ?Exam: ?Vitals:  ? 11/14/21 1300 11/14/21 1700  ?BP: (!) 159/52 (!) 153/54  ?Pulse: (!) 55 (!) 55  ?Resp: 19 16  ?Temp: (!) 97.4 ?F (36.3 ?C) 97.6 ?F (36.4 ?C)  ?SpO2:    ? ?Gen: In bed, NAD ?Resp: non-labored breathing, no acute distress ?Abd: soft, nt ? ?Neuro: ?MS: Awake, she laughs inappropriately, she answers her name, but does not answer any orientation questions.  She does cooperate with some commands, but then gets frustrated and stops cooperating. ?CN: She appears to fixate at times, but does not blink to threat from either side, she crosses midline in both directions. ?Motor: She moves all extremities, relatively symmetric grip though she gives poor effort bilaterally ?Sensory: Endorses sensation bilaterally ? ?Pertinent Labs: ?B12 319 ? ?Impression: 81 year old female with acute encephalopathy in the setting of Pseudomonas otomastoiditis.  She has no headache, no meningismus to suggest CNS spread.  My suspicion at this time is that she initially had a mild septic associated encephalopathy and subsequently after being started on cefepime, her mental status declined, which in the setting of renal failure is highly suspicious for septic being associated neurotoxicity. ? ?She has been changed from cefepime, but ceftazidime can also be associated with a similar neurotoxicity and therefore if there are other agents which would be an option would pursue these. ? ?EEG consistent with encephalopathy, no evidence of seizure like activity. ? ?Recommendations: ?1) would change from cefepime/ceftazidime to another agent ?2) neurology will follow ? ?Roland Rack, MD ?[facility] ?[phone] ? ?If 7pm- 7am, please page neurology on call as listed in [place]. ? ?

## 2021-11-14 NOTE — Progress Notes (Addendum)
?Shawna [name] ?Progress Note  ? ?Subjective:    ?Seen and examined patient at bedside. Patient confused and restless. Noted continuous EEG. Plan for HD 11/15/21. ? ?Objective ?Vitals:  ? 11/13/21 2331 11/14/21 0317 11/14/21 0530 11/14/21 0846  ?BP: 101/63 130/60 120/85 140/61  ?Pulse: (!) 105 (!) 111 (!) 117 (!) 56  ?Resp: '20 16  15  '$ ?Temp: 97.8 ?F (36.6 ?C) 97.8 ?F (36.6 ?C)  98 ?F (36.7 ?C)  ?TempSrc: Axillary Axillary  Oral  ?SpO2:      ?Weight:      ?Height:      ? ?Physical Exam ?General: Awake and pleasantly confused; appears restless; NAD ?Heart: S1 and S2; No murmurs, gallops, or rubs ?Lungs: Clear anteriorly; No wheezing, rales, or rhonchi ?Abdomen: Soft and non-tender ?Extremities: No edema BLLE ?Dialysis Access: LUE AVF (+) B/T  ? ?Filed Weights  ? 11/11/21 1740 11/12/21 1249 11/12/21 1620  ?Weight: 57.5 kg 55.7 kg 54.8 kg  ? ? ?Intake/Output Summary (Last 24 hours) at 11/14/2021 1052 ?Last data filed at 11/14/2021 0400 ?Gross per 24 hour  ?Intake 300.03 ml  ?Output --  ?Net 300.03 ml  ? ? ?Additional Objective ?Labs: ?Basic Metabolic Panel: ?Recent Labs  ?Lab 11/12/21 ?1610 11/13/21 ?9604 11/14/21 ?0017  ?NA 141 138 139  ?K 4.3 3.8 4.3  ?CL 104 99 101  ?CO2 '23 28 22  '$ ?GLUCOSE 82 80 92  ?BUN 17 11 26*  ?CREATININE 4.23* 2.95* 5.09*  ?CALCIUM 9.2 9.4 9.1  ? ?Liver Function Tests: ?No results for input(s): AST, ALT, ALKPHOS, BILITOT, PROT, ALBUMIN in the last 168 hours. ?No results for input(s): LIPASE, AMYLASE in the last 168 hours. ?CBC: ?Recent Labs  ?Lab 11/10/21 ?0455 11/11/21 ?0701 11/12/21 ?5409 11/13/21 ?8119 11/14/21 ?0017  ?WBC 7.7 8.7 7.3 7.9 7.7  ?HGB 6.3* 9.7* 10.4* 10.0* 10.1*  ?HCT 19.3* 29.2* 31.2* 31.7* 32.2*  ?MCV 106.6* 98.6 98.4 100.3* 102.2*  ?PLT PLATELET CLUMPS NOTED ON SMEAR, UNABLE TO ESTIMATE 160 PLATELET CLUMPS NOTED ON SMEAR, UNABLE TO ESTIMATE PLATELET CLUMPS NOTED ON SMEAR, UNABLE TO ESTIMATE 187  ? ?Blood Culture ?   ?Component Value Date/Time  ? SDES EAR 11/09/2021  1530  ? SPECREQUEST  11/09/2021 1530  ?  LEFT EAR ?Performed at [facility], [place] [address] [place], [place] [address] ?  ? CULT RARE PSEUDOMONAS AERUGINOSA 11/09/2021 1530  ? REPTSTATUS 11/11/2021 FINAL 11/09/2021 1530  ? ? ?Cardiac Enzymes: ?No results for input(s): CKTOTAL, CKMB, CKMBINDEX, TROPONINI in the last 168 hours. ?CBG: ?Recent Labs  ?Lab 11/08/21 ?1657 11/12/21 ?1930  ?GLUCAP 103* 83  ? ?Iron Studies: No results for input(s): IRON, TIBC, TRANSFERRIN, FERRITIN in the last 72 hours. ?Lab Results  ?Component Value Date  ? INR 1.3 (H) 11/08/2021  ? INR 1.0 02/03/2021  ? INR 1.1 06/12/2020  ? ?Studies/Results: ?EEG adult ? ?Result Date: 11/13/2021 ?Shawna Havens, MD     11/13/2021 10:51 AM Patient Name: Shawna [name] MRN: [id number] Epilepsy Attending: Lora [name] Referring Physician/Provider: Albertine Patricia, MD Date: 11/13/2021 Duration: 21.10 mins Patient history: 81yo F with ams. EEG to evaluate for seizure Level of alertness: Awake AEDs during EEG study: Ativan Technical aspects: This EEG study was done with scalp electrodes positioned according to the 10-20 International system of electrode placement. Electrical activity was acquired at a sampling rate of '500Hz'$  and reviewed with a high frequency filter of '70Hz'$  and a low frequency filter of '1Hz'$ . EEG data were recorded continuously and  digitally stored. Description: No posterior dominant rhythm was seen. EEG showed continuous generalized 3 to 6 Hz theta-delta slowing. Generalized periodic discharges with triphasic morphology at 1-'2Hz'$  were also noted, more prominent when awake/stimulated. Hyperventilation and photic stimulation were not performed.   ABNORMALITY - Periodic discharges with triphasic morphology, generalized ( GPDs) - Continuous slow, generalized IMPRESSION: This study showed generalized periodic discharges with triphasic morphology at  1-'2Hz'$  which can be on the ictal-interictal continuum. However, the morphology, frequency and  reactivity to stimulation is more likely indicative of toxic-metabolic causes. Additionally there is moderate diffuse encephalopathy, nonspecific etiology. No seizures were seen throughout the recording. [name]  ? ?Overnight EEG with video ? ?Result Date: 11/14/2021 ?Shawna Havens, MD     11/14/2021  9:13 AM Patient Name: Shawna [name] MRN: [id number] Epilepsy Attending: Lora [name] Referring Physician/Provider:  August Albino, NP Duration: 11/13/2021 1747 to 11/14/2021 0900  Patient history: 81yo F with ams. EEG to evaluate for seizure  Level of alertness: Awake, asleep  AEDs during EEG study: None  Technical aspects: This EEG study was done with scalp electrodes positioned according to the 10-20 International system of electrode placement. Electrical activity was acquired at a sampling rate of '500Hz'$  and reviewed with a high frequency filter of '70Hz'$  and a low frequency filter of '1Hz'$ . EEG data were recorded continuously and digitally stored.  Description: No posterior dominant rhythm was seen. Sleep was characterized by vertex waves, sleep spindles (12-'14hz'$ ), maximal frontocentral region. EEG showed continuous generalized 3 to 6 Hz theta-delta slowing. Generalized periodic discharges with triphasic morphology at 1-'2Hz'$  were also noted, more prominent when awake/stimulated. Hyperventilation and photic stimulation were not performed.    ABNORMALITY - Periodic discharges with triphasic morphology, generalized ( GPDs) - Continuous slow, generalized  IMPRESSION: This study showed generalized periodic discharges with triphasic morphology at  1-'2Hz'$  which can be on the ictal-interictal continuum. However, the morphology, frequency and reactivity to stimulation is more likely indicative of toxic-metabolic causes. Additionally there is moderate diffuse encephalopathy, nonspecific etiology. No seizures were seen throughout the recording.  [name]    ? ?Medications: ? sodium chloride    ? sodium chloride    ?  sodium chloride Stopped (11/12/21 1719)  ? amiodarone 30 mg/hr (11/14/21 0120)  ? ? Chlorhexidine Gluconate Cloth  6 each Topical Q0600  ? cyanocobalamin  1,000 mcg Intramuscular Q0600  ? Followed by  ? [START ON 11/21/2021] cyanocobalamin  1,000 mcg Intramuscular Weekly  ? heparin injection (subcutaneous)  5,000 Units Subcutaneous Q8H  ? levothyroxine  25 mcg Oral Q0600  ? LORazepam  0.5 mg Intravenous QHS  ? metoprolol tartrate  5 mg Intravenous Q8H  ? midodrine  5 mg Oral Once  ? midodrine  5-10 mg Oral Q M,W,F-HD  ? multivitamin  1 tablet Oral Daily  ? potassium chloride  30 mEq Oral Once  ? rosuvastatin  10 mg Oral Daily  ? sevelamer carbonate  2,400 mg Oral TID WC  ? tobramycin-dexamethasone  2 drop Left EAR Q6H  ? ? ?Dialysis Orders: ?[facility] MWF ?  3.5h 56.5kg   BFR 300   2/2.5 bath LUA AVF  Heparin none ? - venofer 50 mg q Monday ? - sensipar 30 mg tiw ? - calcitriol 1.75 ug po tiw ? - mircera 200 ug q 2wks, last 4/25 ?- 5/01 lab showed HBsAg neg w/ Abs > 10/ protective ? ?Assessment/Plan: ?AMS/ gen'd weakness: multifactorial sepsis + polypharmacy. Has good HD compliance.  Appears ABX are stopped. Continue to hol gabapentin, oxycodone and xanax.  ?L ear mastoiditis: +pseudomonas L ear cx, on IV cefepime. Fever/ ^wbc improving.  ?Atrial fib - w/ RVR here. Started on IV amio now.  ?ESRD: HD MWF. Next HD 11/15/21. She appears restless at bedside today. Spoke to Dr. Jonnie Finner, apparently she's been calm on HD unit previously. Plan to dialyze her on HD unit as of now. Watch her behavior closely. ?Hypokalemia - resolved, K+ 4.3 today ?BP/vol: BP's controlled, at dry wt, euvolemic on exam. Not on any BP lowering meds. Gets midodrine pre HD tiw. ?Anemia of ESRD: Hgb now 10.1, last esa was high-dose OP on 4/25. Next due due 5/09. Transfuse prn. Sp 2u prbc's 5/03.  ?Metabolic Bone Disease: binder and vitamin when eating ?Hx CAD/ sp CABG - on statin ?Hx of colon and ovarian cancer ? ?Tobie Poet, NP ?[place]  [name] ?11/14/2021,10:52 AM ? LOS: 5 days  ?  ?

## 2021-11-14 NOTE — Procedures (Addendum)
Patient Name: Shawna [name]  ?MRN: [id number]  ?Epilepsy Attending: Lora Havens  ?Referring Physician/Provider:  August Albino, NP ?Duration: 11/13/2021 1747 to 11/14/2021 1535 ?  ?Patient history: 81yo F with ams. EEG to evaluate for seizure ?  ?Level of alertness: Awake, asleep ?  ?AEDs during EEG study: None ?  ?Technical aspects: This EEG study was done with scalp electrodes positioned according to the 10-20 International system of electrode placement. Electrical activity was acquired at a sampling rate of '500Hz'$  and reviewed with a high frequency filter of '70Hz'$  and a low frequency filter of '1Hz'$ . EEG data were recorded continuously and digitally stored.  ?  ?Description: No posterior dominant rhythm was seen. Sleep was characterized by vertex waves, sleep spindles (12-'14hz'$ ), maximal frontocentral region. EEG showed continuous generalized 3 to 6 Hz theta-delta slowing. Generalized periodic discharges with triphasic morphology at 1-'2Hz'$  were also noted, more prominent when awake/stimulated. Hyperventilation and photic stimulation were not performed.     ?ABNORMALITY ?- Periodic discharges with triphasic morphology, generalized ( GPDs) ?- Continuous slow, generalized ?  ?IMPRESSION: ?This study showed generalized periodic discharges with triphasic morphology at  1-'2Hz'$  which can be on the ictal-interictal continuum. However, the morphology, frequency and reactivity to stimulation is more likely indicative of toxic-metabolic causes. Additionally there is moderate diffuse encephalopathy, nonspecific etiology. No seizures were seen throughout the recording. ?  ?Lora Havens  ?  ?

## 2021-11-14 NOTE — Progress Notes (Signed)
?PROGRESS NOTE ? ? ? ?Shawna [name]  KPT:[id number] DOB: 08/21/1940 DOA: 11/08/2021 ?PCP: [facility] ? ? ?Brief Narrative:  ? ?Shawna [name] is a 81 y.o. female with history of paroxysmal atrial fibrillation not on anticoagulation secondary to history of GI bleed, CAD status post CABG and stent placement, ESRD on hemodialysis on Monday Wednesday Friday, prior history of colon cancer, hyperlipidemia was brought to the ER after patient was found to be increasingly confused and weak over the last 24 hours.   ? ?Of note approximately 10 days prior to admission patient presented to the ED with left earache with external otitis media and mastoiditis placed on oral antibiotics at that time.  ? ?Assessment & Plan: ?  ?Principal Problem: ?  Acute encephalopathy ?Active Problems: ?  Hx of CABG ?  Chronic systolic heart failure ([place]) ?  Essential hypertension ?  History of colon cancer ?  History of ovarian cancer ?  ESRD (end stage renal disease) on dialysis [facility]) ?  PAF (paroxysmal atrial fibrillation) ([place]) ?  Acute metabolic encephalopathy ? ? ?Acute metabolic encephalopathy, POA ?Hospital delirium ?-Multifactorial ?- possibly related to polypharmacy,  gabapentin, alprazolam and oxycodone on hold since admission ?-Continue to treat infectious etiology with subacute mastoiditis /malignant otitis external ?-As well possibly related to uremia for which she was dialyzed ?-No acute finding on MRI brain, but she is with extensive chronic small vessel ischemia, generalized brain atrophy which indicates some underlining vascular dementia. ?-EEG significant for triphasic waves, neurology input appreciated, he is currently receiving LTM EEG. ?-Cefepime has been stopped as it may contribute to altered mental status as well. ?-Remains significantly encephalopathic, refusing any oral intake, refusing all meds, will request core track insertion.. ? ? ?Subacute mastoiditis/otitis externa ?-Previously treated outpatient with  improving symptoms per patient and family (no longer having ear pain) ?-ENT input greatly appreciated, she has a wick placed, and started on TobraDex, initially on IV Rocephin, changed to IV cefepime, urine cultures growing Pseudomonas, on IV cefepime for now, timing per ENT.   ?-Discussed with Dr. Melene Plan, no need to remove wick as it will fall and swelling has improved, continue with TobraDex x10 days, continue with IV cefepime x3 weeks ?-IV cefepime has been discontinued as it may contribute to altered mental status, cussed with ID, penicillin allergy, prolonged QTc, next option would be ceftazidime. ? ?ESRD Monday Wednesday Friday ?-Missed dialysis Monday. ?- HD per renal  ? ?History of paroxysmal atrial fibrillation, uncontrolled, possible a flutter ?-  not on anticoagulation secondary to GI bleed  ?-cardiology input greatly appreciated, can transition to p.o. amiodarone once heart rate is more stable. ?- QTc is prolonged secondary to left bundle branch block, and her JTC<500, cardiology input appreciated. ?-Continue with amiodarone drip today as discussed with cardiology, can be transitioned to oral once core track is established. ? ?History of CAD status post CABG  ?-continue home statin  ? ?Anemia of chronic kidney disease  ?- EPO, IV iron per renal.   ?-Received 2 units PRBC 5/3 for hemoglobin of 6.3, globin has been stable since transfusion. ?Hypokalemia ?-Management with HD ? ?Hypertension ?-Blood pressure well controlled currently ? ?History of colon cancer and ovarian cancer. -Completed previous therapy ?   ?DVT prophylaxis: Subcu heparin ?Code Status: Full code. ?Family Communication: None at bedside, discussed with husband by phone 5/4, 5/5. ? ?Status is: Inpatient ? ?Dispo: The patient is from: Home  ?  Patient currently not medically stable for discharge, will need SNF placement, remains confused, on amiodarone drip. ? ?Consultants:  ?Nephrology, ENT ? ?Procedures:   ?None ? ? ?Subjective: ? ?Patient remains confused, unable to provide any complaints, remains with poor appetite, refusing oral intake and meds. ? ?Objective: ?Vitals:  ? 11/13/21 2331 11/14/21 0317 11/14/21 0530 11/14/21 0846  ?BP: 101/63 130/60 120/85 140/61  ?Pulse: (!) 105 (!) 111 (!) 117 (!) 56  ?Resp: '20 16  15  '$ ?Temp: 97.8 ?F (36.6 ?C) 97.8 ?F (36.6 ?C)  98 ?F (36.7 ?C)  ?TempSrc: Axillary Axillary  Oral  ?SpO2:      ?Weight:      ?Height:      ? ? ?Intake/Output Summary (Last 24 hours) at 11/14/2021 1219 ?Last data filed at 11/14/2021 0945 ?Gross per 24 hour  ?Intake 420.03 ml  ?Output --  ?Net 420.03 ml  ? ?Filed Weights  ? 11/11/21 1740 11/12/21 1249 11/12/21 1620  ?Weight: 57.5 kg 55.7 kg 54.8 kg  ? ? ?Examination: ? ?Awake, confused, restless, incoherent significantly impaired cognition and insight. ?Symmetrical Chest wall movement, Good air movement bilaterally, CTAB ?RRR,No Gallops,Rubs or new Murmurs, No Parasternal Heave ?+ve B.Sounds, Abd Soft, No tenderness, No rebound - guarding or rigidity. ?No Cyanosis, Clubbing or edema, No new Rash or bruise   ? ? ? ? ?Data Reviewed: I have personally reviewed following labs and imaging studies ? ?CBC: ?Recent Labs  ?Lab 11/10/21 ?0455 11/11/21 ?0701 11/12/21 ?5374 11/13/21 ?8270 11/14/21 ?0017  ?WBC 7.7 8.7 7.3 7.9 7.7  ?HGB 6.3* 9.7* 10.4* 10.0* 10.1*  ?HCT 19.3* 29.2* 31.2* 31.7* 32.2*  ?MCV 106.6* 98.6 98.4 100.3* 102.2*  ?PLT PLATELET CLUMPS NOTED ON SMEAR, UNABLE TO ESTIMATE 160 PLATELET CLUMPS NOTED ON SMEAR, UNABLE TO ESTIMATE PLATELET CLUMPS NOTED ON SMEAR, UNABLE TO ESTIMATE 187  ? ?Basic Metabolic Panel: ?Recent Labs  ?Lab 11/08/21 ?1800 11/08/21 ?2230 11/10/21 ?0120 11/11/21 ?0701 11/12/21 ?7867 11/13/21 ?5449 11/14/21 ?0017  ?NA 133*   < > 139 140 141 138 139  ?K 4.3   < > 3.2* 3.3* 4.3 3.8 4.3  ?CL 98   < > 99 101 104 99 101  ?CO2 20*   < > '23 23 23 28 22  '$ ?GLUCOSE 129*   < > 107* 110* 82 80 92  ?BUN 52*   < > 32* 51* 17 11 26*  ?CREATININE 9.72*    < > 5.91* 8.21* 4.23* 2.95* 5.09*  ?CALCIUM 9.1   < > 8.4* 8.6* 9.2 9.4 9.1  ?MG 1.9  --   --   --   --   --   --   ? < > = values in this interval not displayed.  ? ?GFR: ?Estimated Creatinine Clearance: 6.5 mL/min (A) (by C-G formula based on SCr of 5.09 mg/dL (H)). ?Liver Function Tests: ?No results for input(s): AST, ALT, ALKPHOS, BILITOT, PROT, ALBUMIN in the last 168 hours. ?No results for input(s): LIPASE, AMYLASE in the last 168 hours. ?Recent Labs  ?Lab 11/08/21 ?1800  ?AMMONIA 33  ? ?Coagulation Profile: ?Recent Labs  ?Lab 11/08/21 ?1800  ?INR 1.3*  ? ?Cardiac Enzymes: ?No results for input(s): CKTOTAL, CKMB, CKMBINDEX, TROPONINI in the last 168 hours. ?BNP (last 3 results) ?No results for input(s): PROBNP in the last 8760 hours. ?HbA1C: ?No results for input(s): HGBA1C in the last 72 hours. ?CBG: ?Recent Labs  ?Lab 11/08/21 ?1657 11/12/21 ?1930  ?GLUCAP 103* 83  ? ?Lipid Profile: ?No results for input(s):  CHOL, HDL, LDLCALC, TRIG, CHOLHDL, LDLDIRECT in the last 72 hours. ?Thyroid Function Tests: ?No results for input(s): TSH, T4TOTAL, FREET4, T3FREE, THYROIDAB in the last 72 hours. ?Anemia Panel: ?No results for input(s): VITAMINB12, FOLATE, FERRITIN, TIBC, IRON, RETICCTPCT in the last 72 hours. ?Sepsis Labs: ?No results for input(s): PROCALCITON, LATICACIDVEN in the last 168 hours. ? ?Recent Results (from the past 240 hour(s))  ?Blood culture (routine x 2)     Status: None  ? Collection Time: 11/08/21  6:00 PM  ? Specimen: BLOOD  ?Result Value Ref Range Status  ? Specimen Description BLOOD  Final  ? Special Requests   Final  ?  BOTTLES DRAWN AEROBIC AND ANAEROBIC Blood Culture results may not be optimal due to an inadequate volume of blood received in culture bottles RT EJ  ? Culture   Final  ?  NO GROWTH 5 DAYS ?Performed at [facility], [place] [address] [place], [place] [address] ?  ? Report Status 11/13/2021 FINAL  Final  ?Ear culture     Status: None  ? Collection Time: 11/09/21  3:30 PM  ?  Specimen: Ear; Other  ?Result Value Ref Range Status  ? Specimen Description EAR  Final  ? Special Requests   Final  ?  LEFT EAR ?Performed at [facility], [place] [address] [place],  [address] ?  ? Culture RARE PSEUDOMO

## 2021-11-15 ENCOUNTER — Inpatient Hospital Stay (HOSPITAL_COMMUNITY): Payer: Medicare HMO

## 2021-11-15 DIAGNOSIS — G9340 Encephalopathy, unspecified: Secondary | ICD-10-CM | POA: Diagnosis not present

## 2021-11-15 DIAGNOSIS — I5022 Chronic systolic (congestive) heart failure: Secondary | ICD-10-CM | POA: Diagnosis not present

## 2021-11-15 DIAGNOSIS — H6092 Unspecified otitis externa, left ear: Secondary | ICD-10-CM | POA: Diagnosis not present

## 2021-11-15 DIAGNOSIS — I480 Paroxysmal atrial fibrillation: Secondary | ICD-10-CM | POA: Diagnosis not present

## 2021-11-15 LAB — CBC
HCT: 28.3 % — ABNORMAL LOW (ref 36.0–46.0)
Hemoglobin: 9.1 g/dL — ABNORMAL LOW (ref 12.0–15.0)
MCH: 32.6 pg (ref 26.0–34.0)
MCHC: 32.2 g/dL (ref 30.0–36.0)
MCV: 101.4 fL — ABNORMAL HIGH (ref 80.0–100.0)
Platelets: 248 10*3/uL (ref 150–400)
RBC: 2.79 MIL/uL — ABNORMAL LOW (ref 3.87–5.11)
RDW: 20.2 % — ABNORMAL HIGH (ref 11.5–15.5)
WBC: 9.4 10*3/uL (ref 4.0–10.5)
nRBC: 0 % (ref 0.0–0.2)

## 2021-11-15 LAB — GLUCOSE, CAPILLARY
Glucose-Capillary: 111 mg/dL — ABNORMAL HIGH (ref 70–99)
Glucose-Capillary: 119 mg/dL — ABNORMAL HIGH (ref 70–99)
Glucose-Capillary: 163 mg/dL — ABNORMAL HIGH (ref 70–99)
Glucose-Capillary: 94 mg/dL (ref 70–99)

## 2021-11-15 LAB — RENAL FUNCTION PANEL
Albumin: 2.3 g/dL — ABNORMAL LOW (ref 3.5–5.0)
Anion gap: 19 — ABNORMAL HIGH (ref 5–15)
BUN: 42 mg/dL — ABNORMAL HIGH (ref 8–23)
CO2: 20 mmol/L — ABNORMAL LOW (ref 22–32)
Calcium: 8.7 mg/dL — ABNORMAL LOW (ref 8.9–10.3)
Chloride: 102 mmol/L (ref 98–111)
Creatinine, Ser: 7.99 mg/dL — ABNORMAL HIGH (ref 0.44–1.00)
GFR, Estimated: 5 mL/min — ABNORMAL LOW (ref >60)
Glucose, Bld: 111 mg/dL — ABNORMAL HIGH (ref 70–99)
Phosphorus: 4.6 mg/dL (ref 2.5–4.6)
Potassium: 3.8 mmol/L (ref 3.5–5.1)
Sodium: 141 mmol/L (ref 135–145)

## 2021-11-15 LAB — MAGNESIUM
Magnesium: 1.8 mg/dL (ref 1.7–2.4)
Magnesium: 1.5 mg/dL — ABNORMAL LOW (ref 1.7–2.4)

## 2021-11-15 LAB — C-REACTIVE PROTEIN: CRP: 6.2 mg/dL — ABNORMAL HIGH (ref <1.0)

## 2021-11-15 LAB — PHOSPHORUS: Phosphorus: 1.8 mg/dL — ABNORMAL LOW (ref 2.5–4.6)

## 2021-11-15 IMAGING — DX DG ABD PORTABLE 1V
1 series · 1 of 1 positions shown · non-contrast
Comparison: [DATE]

CLINICAL DATA: Feeding tube placement.

EXAM:
PORTABLE ABDOMEN - 1 VIEW

[abdomen]
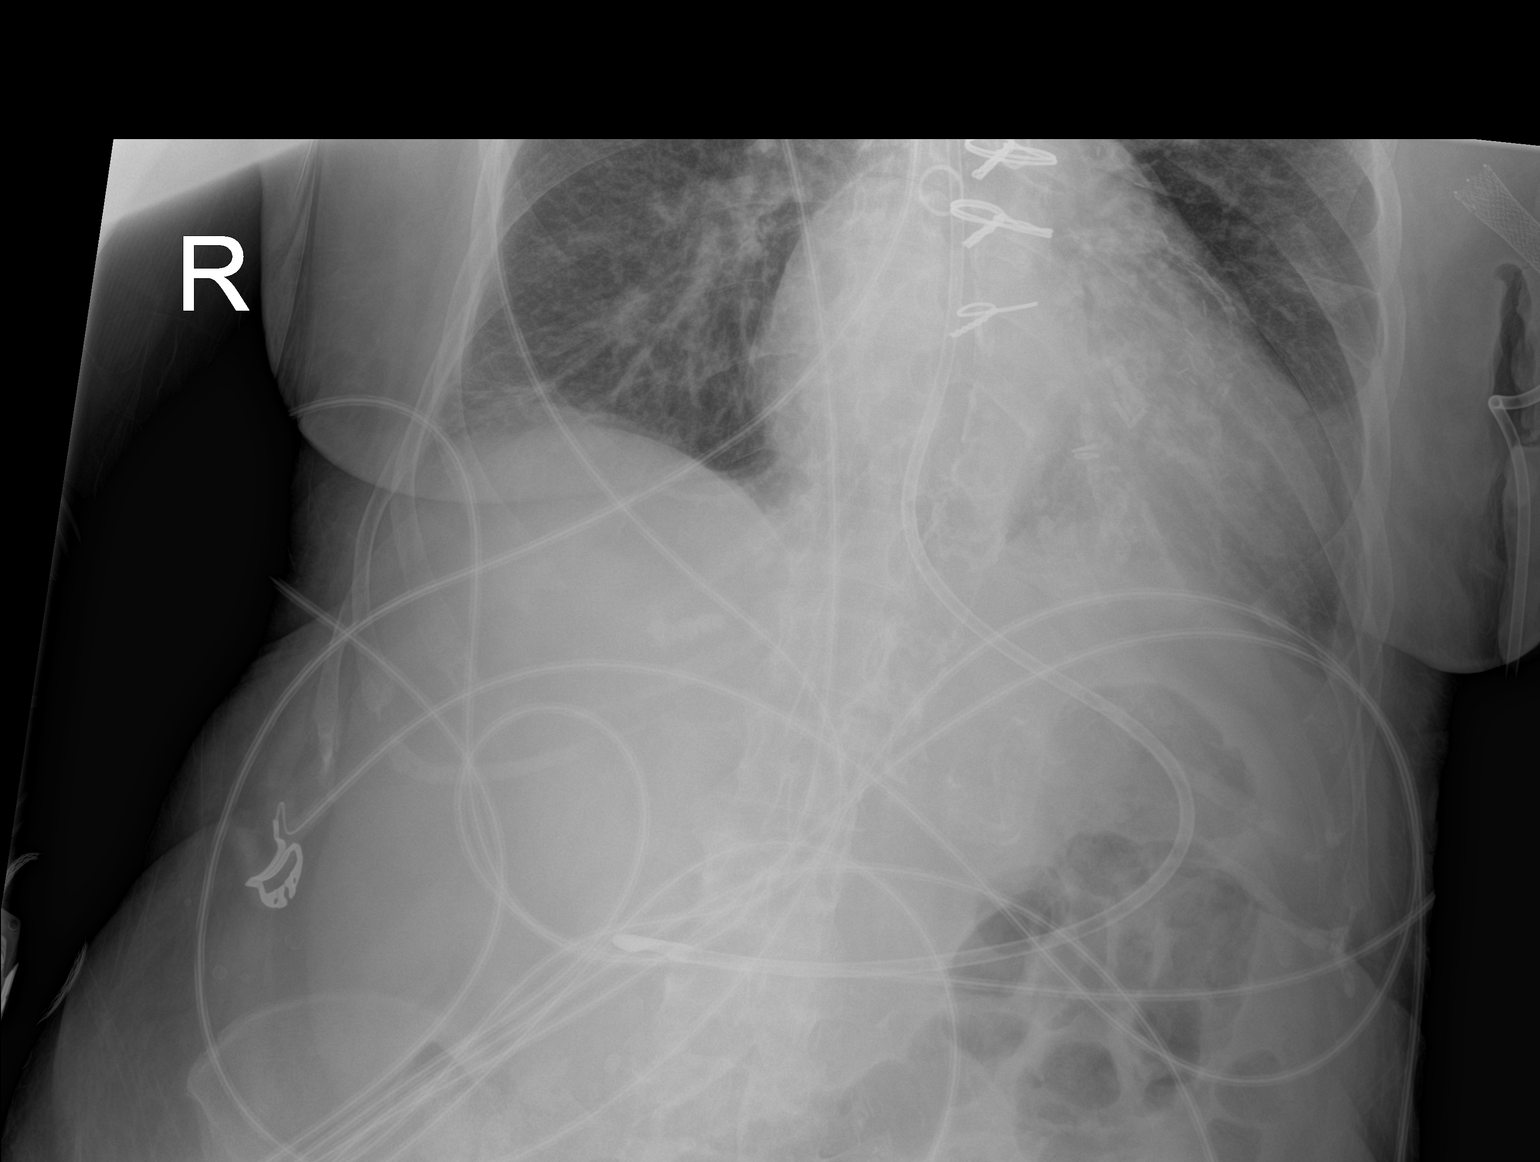

[1 of 1 positions shown; findings below may reference images not displayed]

FINDINGS: The tip of the feeding tube is in the distal stomach, near the
pylorus and directed distally towards the duodenum. Visualized bowel
gas in the upper abdomen is unremarkable.
IMPRESSION: Feeding tube tip is at the pylorus directed towards the duodenum.

## 2021-11-15 MED ORDER — METOPROLOL TARTRATE 25 MG PO TABS
25.0000 mg | ORAL_TABLET | Freq: Two times a day (BID) | ORAL | Status: DC
  Administered 2021-11-15 – 2021-11-24 (×15): 25 mg
  Filled 2021-11-15 (×17): qty 1

## 2021-11-15 MED ORDER — SENNOSIDES-DOCUSATE SODIUM 8.6-50 MG PO TABS
1.0000 | ORAL_TABLET | Freq: Two times a day (BID) | ORAL | Status: DC
  Administered 2021-11-15: 1 via ORAL
  Filled 2021-11-15: qty 1

## 2021-11-15 MED ORDER — OSMOLITE 1.5 CAL PO LIQD
1000.0000 mL | ORAL | Status: DC
  Administered 2021-11-15 – 2021-11-18 (×4): 1000 mL
  Filled 2021-11-15 (×4): qty 1000

## 2021-11-15 MED ORDER — PROSOURCE TF PO LIQD
45.0000 mL | Freq: Two times a day (BID) | ORAL | Status: DC
  Administered 2021-11-15 – 2021-11-18 (×5): 45 mL
  Filled 2021-11-15 (×5): qty 45

## 2021-11-15 MED ORDER — AMIODARONE HCL 200 MG PO TABS
400.0000 mg | ORAL_TABLET | Freq: Two times a day (BID) | ORAL | Status: DC
  Administered 2021-11-15 – 2021-11-18 (×4): 400 mg
  Filled 2021-11-15 (×6): qty 2

## 2021-11-15 MED ORDER — THIAMINE HCL 100 MG PO TABS
100.0000 mg | ORAL_TABLET | Freq: Every day | ORAL | Status: AC
  Administered 2021-11-16 – 2021-11-21 (×5): 100 mg via NASOGASTRIC
  Filled 2021-11-15 (×5): qty 1

## 2021-11-15 NOTE — Progress Notes (Signed)
Subjective: ?Resting comfortably in bed, Confused.  ? ?Objective: ?Vital signs in last 24 hours: ?Temp:  [97.4 ?F (36.3 ?C)-98 ?F (36.7 ?C)] 97.8 ?F (36.6 ?C) (05/08 0400) ?Pulse Rate:  [55-101] 60 (05/08 0400) ?Resp:  [15-19] 19 (05/07 2337) ?BP: (133-159)/(52-78) 138/78 (05/08 0400) ?SpO2:  [96 %] 96 % (05/08 0400) ? ?Physical Exam: ?General: NAD, confused.  ?Head: Normocephalic, no evidence injury, no tenderness, facial buttresses intact without stepoff.  ?Face/sinus: No tenderness to palpation and percussion. Facial movement is normal and symmetric.  ?Eyes: PERRL, EOMI. No scleral icterus, conjunctivae clear.  ?Ears: Auricles well formed without lesions.  No left ear drainage. Left mastoid is firm and nontender. No edema or erythema. The right ear canal, tympanic membrane, and middle ear space are normal.  ?Nose: External evaluation reveals normal support and skin without lesions.  Dorsum is intact.  Anterior rhinoscopy reveals pink mucosa over anterior aspect of inferior turbinates and intact septum.  No purulence noted.  ?Oral:  Oral cavity and oropharynx are intact, symmetric, without erythema or edema.  Mucosa is moist without lesions.  ?Neck: Full range of motion without pain.  There is no significant lymphadenopathy.  No masses palpable.  Trachea is midline.  ? ?Recent Labs  ?  11/13/21 ?2671 11/14/21 ?0017  ?WBC 7.9 7.7  ?HGB 10.0* 10.1*  ?HCT 31.7* 32.2*  ?PLT PLATELET CLUMPS NOTED ON SMEAR, UNABLE TO ESTIMATE 187  ? ?Recent Labs  ?  11/13/21 ?2458 11/14/21 ?0017  ?NA 138 139  ?K 3.8 4.3  ?CL 99 101  ?CO2 28 22  ?GLUCOSE 80 92  ?BUN 11 26*  ?CREATININE 2.95* 5.09*  ?CALCIUM 9.4 9.1  ? ? ?Medications: I have reviewed the patient's current medications. ?Scheduled: ? Chlorhexidine Gluconate Cloth  6 each Topical Q0600  ? cyanocobalamin  1,000 mcg Intramuscular Q0600  ? Followed by  ? [START ON 11/21/2021] cyanocobalamin  1,000 mcg Intramuscular Weekly  ? heparin injection (subcutaneous)  5,000 Units  Subcutaneous Q8H  ? levothyroxine  25 mcg Oral Q0600  ? LORazepam  0.5 mg Intravenous QHS  ? metoprolol tartrate  5 mg Intravenous Q8H  ? midodrine  5-10 mg Oral Q M,W,F-HD  ? multivitamin  1 tablet Oral Daily  ? rosuvastatin  10 mg Oral Daily  ? sevelamer carbonate  2,400 mg Oral TID WC  ? tobramycin-dexamethasone  2 drop Left EAR Q6H  ? ?Continuous: ? sodium chloride    ? sodium chloride    ? sodium chloride Stopped (11/12/21 1719)  ? sodium chloride    ? sodium chloride    ? sodium chloride 50 mL/hr at 11/14/21 1239  ? amiodarone 30 mg/hr (11/15/21 0326)  ? diphenhydrAMINE 12.5 mg (11/14/21 2053)  ? piperacillin-tazobactam (ZOSYN)  IV 2.25 g (11/15/21 0115)  ? ? ?Assessment/Plan: ?Left otitis externa with left ear canal polyp.  ?- Possible malignant otitis externa. ?- No clinical evidence of mastoiditis. No mastoid tenderness or edema. ?- Continue ciprodex eardrops 4 drops BID. ?- Agree with 3 weeks of antibiotic coverage. ?- Patient may follow up with me in my office after discharge. ? ? LOS: 6 days  ? ?Lyman Balingit W Benjamine Mola ?11/15/2021, 7:13 AM  ?

## 2021-11-15 NOTE — Progress Notes (Signed)
removed 1039ms net fluid no complaints no complications tolerated tx well.  pre bp 158/54 post bp 163/51 pre weight 56.8kg post weight 55.5kg.  2 bandages to lua avf no bleeding dressing cdi.  gave 1500 units of heparin as ordered to prevent clotting. ?

## 2021-11-15 NOTE — Progress Notes (Addendum)
? ?Progress Note ? ?Patient Name: Shawna [name] ?Date of Encounter: 11/15/2021 ? ?[facility] Cardiologist: Carlyle Dolly, MD  ? ?Subjective  ? ?Patient not oriented to person, place, or time. In soft restraints ? ?Telemetry shows predominantly sinus rhythm with HR in the 60s, an episode of abrupt onset atrial tachycardia (suspicious for 2:1 atrial flutter) this AM around 0600  ? ?Inpatient Medications  ?  ?Scheduled Meds: ? amiodarone  400 mg Per Tube BID  ? Chlorhexidine Gluconate Cloth  6 each Topical Q0600  ? cyanocobalamin  1,000 mcg Intramuscular Q0600  ? Followed by  ? [START ON 11/21/2021] cyanocobalamin  1,000 mcg Intramuscular Weekly  ? heparin injection (subcutaneous)  5,000 Units Subcutaneous Q8H  ? levothyroxine  25 mcg Oral Q0600  ? LORazepam  0.5 mg Intravenous QHS  ? metoprolol tartrate  25 mg Per Tube BID  ? midodrine  5-10 mg Oral Q M,W,F-HD  ? multivitamin  1 tablet Oral Daily  ? rosuvastatin  10 mg Oral Daily  ? sevelamer carbonate  2,400 mg Oral TID WC  ? tobramycin-dexamethasone  2 drop Left EAR Q6H  ? ?Continuous Infusions: ? sodium chloride    ? sodium chloride    ? sodium chloride Stopped (11/12/21 1719)  ? sodium chloride    ? sodium chloride    ? diphenhydrAMINE 12.5 mg (11/14/21 2053)  ? piperacillin-tazobactam (ZOSYN)  IV 2.25 g (11/15/21 0933)  ? ?PRN Meds: ?sodium chloride, sodium chloride, sodium chloride, sodium chloride, sodium chloride, alteplase, diphenhydrAMINE, heparin, heparin, hydrOXYzine, lidocaine (PF), lidocaine-prilocaine, pentafluoroprop-tetrafluoroeth  ? ?Vital Signs  ?  ?Vitals:  ? 11/14/21 2000 11/14/21 2337 11/15/21 0400 11/15/21 0748  ?BP:  (!) 133/52 138/78 (!) 159/49  ?Pulse:  (!) 57 60 (!) 55  ?Resp:  19  20  ?Temp:  97.8 ?F (36.6 ?C) 97.8 ?F (36.6 ?C) 97.6 ?F (36.4 ?C)  ?TempSrc:  Axillary Axillary Axillary  ?SpO2: 96%  96%   ?Weight:      ?Height:      ? ? ?Intake/Output Summary (Last 24 hours) at 11/15/2021 1027 ?Last data filed at 11/15/2021 0440 ?Gross per 24  hour  ?Intake 951.7 ml  ?Output 0 ml  ?Net 951.7 ml  ? ? ?  11/12/2021  ?  4:20 PM 11/12/2021  ? 12:49 PM 11/11/2021  ?  5:40 PM  ?Last 3 Weights  ?Weight (lbs) 120 lb 13 oz 122 lb 12.7 oz 126 lb 12.2 oz  ?Weight (kg) 54.8 kg 55.7 kg 57.5 kg  ?   ? ?Telemetry  ?  ? predominantly sinus rhythm with HR in the 60s, an episode of abrupt onset atrial tachycardia (suspicious for 2:1 atrial flutter) this AM around 0600  - Personally Reviewed ? ?ECG  ?  ?No new tracings since 11/12/21 - Personally Reviewed ? ?Physical Exam  ? ?GEN: Disoriented, in soft restraints  ?Neck: No JVD ?Cardiac: RRR, no murmurs, rubs, or gallops.  ?Respiratory: Anterior lung exam clear to ausculation, no increased WOB  ?GI: Soft, nontender, non-distended  ?MS: No edema; No deformity. ?Neuro:  Not oriented to person, place, or time  ?Psych: Normal affect  ? ?Labs  ?  ?High Sensitivity Troponin:   ?Recent Labs  ?Lab 10/18/21 ?2217 10/19/21 ?0008 10/19/21 ?4403 10/19/21 ?4742 10/29/21 ?1631  ?TROPONINIHS 132* 132* 130* 126* 63*  ?   ?Chemistry ?Recent Labs  ?Lab 11/08/21 ?1800 11/08/21 ?2230 11/12/21 ?5956 11/13/21 ?3875 11/14/21 ?0017  ?NA 133*   < > 141 138 139  ?K  4.3   < > 4.3 3.8 4.3  ?CL 98   < > 104 99 101  ?CO2 20*   < > '23 28 22  '$ ?GLUCOSE 129*   < > 82 80 92  ?BUN 52*   < > 17 11 26*  ?CREATININE 9.72*   < > 4.23* 2.95* 5.09*  ?CALCIUM 9.1   < > 9.2 9.4 9.1  ?MG 1.9  --   --   --   --   ?GFRNONAA 4*   < > 10* 15* 8*  ?ANIONGAP 15   < > 14 11 16*  ? < > = values in this interval not displayed.  ?  ?Lipids No results for input(s): CHOL, TRIG, HDL, LABVLDL, LDLCALC, CHOLHDL in the last 168 hours.  ?Hematology ?Recent Labs  ?Lab 11/12/21 ?1443 11/13/21 ?1540 11/14/21 ?0017  ?WBC 7.3 7.9 7.7  ?RBC 3.17* 3.16* 3.15*  ?HGB 10.4* 10.0* 10.1*  ?HCT 31.2* 31.7* 32.2*  ?MCV 98.4 100.3* 102.2*  ?MCH 32.8 31.6 32.1  ?MCHC 33.3 31.5 31.4  ?RDW 20.9* 20.4* 20.4*  ?PLT PLATELET CLUMPS NOTED ON SMEAR, UNABLE TO ESTIMATE PLATELET CLUMPS NOTED ON SMEAR, UNABLE TO  ESTIMATE 187  ? ?Thyroid No results for input(s): TSH, FREET4 in the last 168 hours.  ?BNP ?Recent Labs  ?Lab 11/08/21 ?1800  ?BNP 1,391.8*  ?  ?DDimer No results for input(s): DDIMER in the last 168 hours.  ? ?Radiology  ?  ?Overnight EEG with video ? ?Result Date: 11/14/2021 ?Lora Havens, MD     11/14/2021  7:38 PM Patient Name: Shawna [name] MRN: [id number] Epilepsy Attending: Lora Havens Referring Physician/Provider:  August Albino, NP Duration: 11/13/2021 1747 to 11/14/2021 1535  Patient history: 81yo F with ams. EEG to evaluate for seizure  Level of alertness: Awake, asleep  AEDs during EEG study: None  Technical aspects: This EEG study was done with scalp electrodes positioned according to the 10-20 International system of electrode placement. Electrical activity was acquired at a sampling rate of '500Hz'$  and reviewed with a high frequency filter of '70Hz'$  and a low frequency filter of '1Hz'$ . EEG data were recorded continuously and digitally stored.  Description: No posterior dominant rhythm was seen. Sleep was characterized by vertex waves, sleep spindles (12-'14hz'$ ), maximal frontocentral region. EEG showed continuous generalized 3 to 6 Hz theta-delta slowing. Generalized periodic discharges with triphasic morphology at 1-'2Hz'$  were also noted, more prominent when awake/stimulated. Hyperventilation and photic stimulation were not performed.    ABNORMALITY - Periodic discharges with triphasic morphology, generalized ( GPDs) - Continuous slow, generalized  IMPRESSION: This study showed generalized periodic discharges with triphasic morphology at  1-'2Hz'$  which can be on the ictal-interictal continuum. However, the morphology, frequency and reactivity to stimulation is more likely indicative of toxic-metabolic causes. Additionally there is moderate diffuse encephalopathy, nonspecific etiology. No seizures were seen throughout the recording.  [name]    ? ?Cardiac Studies  ? ?TTE 07/16/2021 ? 1. Left ventricular  ejection fraction, by estimation, is 30 to 35%. The  ?left ventricle has moderately decreased function. The left ventricle  ?demonstrates regional wall motion abnormalities (see scoring  ?diagram/findings for description). There is mild  ?left ventricular hypertrophy. Left ventricular diastolic parameters are  ?consistent with Grade II diastolic dysfunction (pseudonormalization).  ?Elevated left atrial pressure.  ? 2. Right ventricular systolic function is moderately reduced. The right  ?ventricular size is normal. There is mildly elevated pulmonary artery  ?systolic pressure. The estimated right ventricular systolic  pressure is  ?43.4 mmHg.  ? 3. Left atrial size was severely dilated.  ? 4. Right atrial size was mildly dilated.  ? 5. The mitral valve is degenerative. Moderate mitral valve regurgitation.  ?Mild to moderate mitral stenosis. MG 30mHg at 70bpm, MVA 1.2 cm^2 by  ?continuity equation. Moderate mitral annular calcification.  ? 6. Tricuspid valve regurgitation is mild to moderate.  ? 7. The aortic valve is tricuspid. Aortic valve regurgitation is not  ?visualized. Aortic valve sclerosis/calcification is present, without any  ?evidence of aortic stenosis.  ? 8. The inferior vena cava is normal in size with greater than 50%  ?respiratory variability, suggesting right atrial pressure of 3 mmHg. ? ?Patient Profile  ?   ?81y.o. female with a hx of paroxysmal atrial fibrillation not on anticoagulation secondary to history of GI bleed, HFrEF, CAD s/p CABG and stent placement (Cath Jan 2023- stable disease), ESRD on hemodialysis on MWF, prior history of colon cancer, HLD who is being seen 11/12/2021 for the evaluation of atrial fibrillation at the request of Dr. EWaldron Labs here with AMS -delirium,  subacute mastoiditis versus otitis media on IV antibiotics ? ?Assessment & Plan  ?  ?Paroxysmal Atrial Fibrillation vs Atrial Flutter  ?- Per telemetry, patient is predominantly maintaining sinus rhythm with HR 60s,  however has infrequent episodes of abrupt onset/termination of tachycardia. Suspicious for 2:1 atrial flutter with LBBB ?- Was started on IV amiodarone this admission, now on oral 400 mg BID. Continue for 2 wee

## 2021-11-15 NOTE — Procedures (Signed)
Cortrak ? ?Person Inserting Tube:  Marveen Reeks, RD ?Tube Type:  Cortrak - 43 inches ?Tube Size:  10 ?Tube Location:  Left nare ?Secured by: Dorann Lodge ?Technique Used to Measure Tube Placement:  Marking at nare/corner of mouth ?Cortrak Secured At:  66 cm ? ?Cortrak Tube Team Note: ? ?Consult received to place a Cortrak feeding tube.  ? ?X-ray is required, abdominal x-ray has been ordered by the Cortrak team. Please confirm tube placement before using the Cortrak tube.  ? ?If the tube becomes dislodged please keep the tube and contact the Cortrak team at [email or web address] (password [email or web address]) for replacement.  ?If after hours and replacement cannot be delayed, place a NG tube and confirm placement with an abdominal x-ray.  ? ? ?Hermina Barters RD, LDN ?Clinical Dietitian ?See AMiON for contact information.  ? ? ?

## 2021-11-15 NOTE — Progress Notes (Signed)
?PROGRESS NOTE ? ? ? ?Shawna [name]  OMV:[id number] DOB: Mar 01, 1941 DOA: 11/08/2021 ?PCP: [facility] ? ? ?Brief Narrative:  ? ?Shawna [name] is a 81 y.o. female with history of paroxysmal atrial fibrillation not on anticoagulation secondary to history of GI bleed, CAD status post CABG and stent placement, ESRD on hemodialysis on Monday Wednesday Friday, prior history of colon cancer, hyperlipidemia was brought to the ER after patient was found to be increasingly confused and weak over the last 24 hours.   ?-Patient with known left ear infection followed by ENT as an outpatient on eardrops, during hospital stay she was evaluated by ENT, were culture growing Pseudomonas, she was treated with IV cefepime, patient mental status continues to worsening, with poor oral intake, refusing all meds she required core track tube insertion, encephalopathy thought to be secondary to infection, cefepime antibiotics, she was followed closely by renal given she is ESRD patient where she was dialyzed on schedule ? ? ?Assessment & Plan: ?  ?Principal Problem: ?  Acute encephalopathy ?Active Problems: ?  Hx of CABG ?  Chronic systolic heart failure ([place]) ?  Essential hypertension ?  History of colon cancer ?  History of ovarian cancer ?  ESRD (end stage renal disease) on dialysis [facility]) ?  PAF (paroxysmal atrial fibrillation) ([place]) ?  Acute metabolic encephalopathy ? ? ?Acute metabolic encephalopathy, POA ?Hospital delirium ?-Multifactorial ?- possibly related to polypharmacy,  gabapentin, alprazolam and oxycodone on hold since admission ?-Continue to treat infectious etiology with subacute mastoiditis /malignant otitis external ?-As well possibly related to uremia for which she was dialyzed ?-No acute finding on MRI brain, but she is with extensive chronic small vessel ischemia, generalized brain atrophy which indicates some underlining vascular dementia. ?-Neurology input greatly appreciated, EEG significant for triphasic  waves, but LTM EEG with no evidence of seizures, her encephalopathy thought to be secondary to cefepime, please see discussion below . ?-Patient remains significantly confused, she is awake but refusing all meds and all oral intake, core track inserted today we will start on tube feed BM meds. ? ?Malignant otitis externa ?-ENT input greatly appreciated, low clinical suspicion for mastoiditis. ?-Previously treated outpatient with improving symptoms per patient and family (no longer having ear pain) ?-ENT input greatly appreciated, she has a wick placed, and started on TobraDex, and IV cefepime. ?-Proving, continue with TobraDex eardrops. ?-Showed recommendation for IV antibiotics x3 weeks. ?-IV cefepime has been discontinued given encephalopathy , try avoid ceftazidime for the same reason . ?-For now avoiding quinolones given prolonged QTc . ?-Apparently she tolerated Zosyn in 2017 despite her allergies, so she was started on Zosyn yesterday with no reaction.  . ? ?ESRD Monday Wednesday Friday ?-Missed dialysis Monday. ?- HD per renal  ? ?History of paroxysmal atrial fibrillation, uncontrolled, possible a flutter ?-  not on anticoagulation secondary to GI bleed  ?-cardiology input greatly appreciated, can transition to p.o. amiodarone once heart rate is more stable. ?- QTc is prolonged secondary to left bundle branch block, and her JTC<500, cardiology input appreciated. ?-She is on amiodarone drip currently, I have discontinued this morning given heart rate in the 50s, will transition to oral amiodarone now core track is inserted.   ?-As well if blood pressure remains elevated will  increase her metoprolol. ? ?History of CAD status post CABG  ?-continue home statin  ? ?Anemia of chronic kidney disease  ?- EPO, IV iron per renal.   ?-Received 2 units PRBC 5/3 for hemoglobin of 6.3,  globin has been stable since transfusion. ? ?Hypokalemia ?-Management with HD ? ?Hypertension ?-Blood pressure well controlled  currently ? ?History of colon cancer and ovarian cancer. -Completed previous therapy ?   ?DVT prophylaxis: Subcu heparin ?Code Status: Full code. ?Family Communication: None at bedside, discussed with husband by phone DAILY ? ?Status is: Inpatient ? ?Dispo: The patient is from: Home  ?             Patient currently not medically stable for discharge, will need SNF placement, remains confused, on amiodarone drip. ? ?Consultants:  ?Nephrology, ENT ? ?Procedures:  ?None ? ? ?Subjective: ? ?No significant events overnight, she is unable to provide any complaints, still refusing oral intake and her meds. ?Objective: ?Vitals:  ? 11/14/21 2000 11/14/21 2337 11/15/21 0400 11/15/21 0748  ?BP:  (!) 133/52 138/78 (!) 159/49  ?Pulse:  (!) 57 60 (!) 55  ?Resp:  19  20  ?Temp:  97.8 ?F (36.6 ?C) 97.8 ?F (36.6 ?C) 97.6 ?F (36.4 ?C)  ?TempSrc:  Axillary Axillary Axillary  ?SpO2: 96%  96%   ?Weight:      ?Height:      ? ? ?Intake/Output Summary (Last 24 hours) at 11/15/2021 1011 ?Last data filed at 11/15/2021 0440 ?Gross per 24 hour  ?Intake 951.7 ml  ?Output 0 ml  ?Net 951.7 ml  ? ?Filed Weights  ? 11/11/21 1740 11/12/21 1249 11/12/21 1620  ?Weight: 57.5 kg 55.7 kg 54.8 kg  ? ? ?Examination: ? ?Awake, confused, restless, in no apparent distress, does not follow commands or answer any questions appropriately ?Symmetrical Chest wall movement, Good air movement bilaterally, CTAB ?RRR,No Gallops,Rubs or new Murmurs, No Parasternal Heave ?+ve B.Sounds, Abd Soft, No tenderness, No rebound - guarding or rigidity. ?No Cyanosis, Clubbing or edema, No new Rash or bruise   ? ? ? ? ?Data Reviewed: I have personally reviewed following labs and imaging studies ? ?CBC: ?Recent Labs  ?Lab 11/10/21 ?0455 11/11/21 ?0701 11/12/21 ?8185 11/13/21 ?6314 11/14/21 ?0017  ?WBC 7.7 8.7 7.3 7.9 7.7  ?HGB 6.3* 9.7* 10.4* 10.0* 10.1*  ?HCT 19.3* 29.2* 31.2* 31.7* 32.2*  ?MCV 106.6* 98.6 98.4 100.3* 102.2*  ?PLT PLATELET CLUMPS NOTED ON SMEAR, UNABLE TO ESTIMATE  160 PLATELET CLUMPS NOTED ON SMEAR, UNABLE TO ESTIMATE PLATELET CLUMPS NOTED ON SMEAR, UNABLE TO ESTIMATE 187  ? ?Basic Metabolic Panel: ?Recent Labs  ?Lab 11/08/21 ?1800 11/08/21 ?2230 11/10/21 ?0120 11/11/21 ?0701 11/12/21 ?9702 11/13/21 ?6378 11/14/21 ?0017  ?NA 133*   < > 139 140 141 138 139  ?K 4.3   < > 3.2* 3.3* 4.3 3.8 4.3  ?CL 98   < > 99 101 104 99 101  ?CO2 20*   < > '23 23 23 28 22  '$ ?GLUCOSE 129*   < > 107* 110* 82 80 92  ?BUN 52*   < > 32* 51* 17 11 26*  ?CREATININE 9.72*   < > 5.91* 8.21* 4.23* 2.95* 5.09*  ?CALCIUM 9.1   < > 8.4* 8.6* 9.2 9.4 9.1  ?MG 1.9  --   --   --   --   --   --   ? < > = values in this interval not displayed.  ? ?GFR: ?Estimated Creatinine Clearance: 6.5 mL/min (A) (by C-G formula based on SCr of 5.09 mg/dL (H)). ?Liver Function Tests: ?No results for input(s): AST, ALT, ALKPHOS, BILITOT, PROT, ALBUMIN in the last 168 hours. ?No results for input(s): LIPASE, AMYLASE in the last 168 hours. ?Recent  Labs  ?Lab 11/08/21 ?1800  ?AMMONIA 33  ? ?Coagulation Profile: ?Recent Labs  ?Lab 11/08/21 ?1800  ?INR 1.3*  ? ?Cardiac Enzymes: ?No results for input(s): CKTOTAL, CKMB, CKMBINDEX, TROPONINI in the last 168 hours. ?BNP (last 3 results) ?No results for input(s): PROBNP in the last 8760 hours. ?HbA1C: ?No results for input(s): HGBA1C in the last 72 hours. ?CBG: ?Recent Labs  ?Lab 11/08/21 ?1657 11/12/21 ?1930 11/15/21 ?0759  ?GLUCAP 103* 83 111*  ? ?Lipid Profile: ?No results for input(s): CHOL, HDL, LDLCALC, TRIG, CHOLHDL, LDLDIRECT in the last 72 hours. ?Thyroid Function Tests: ?No results for input(s): TSH, T4TOTAL, FREET4, T3FREE, THYROIDAB in the last 72 hours. ?Anemia Panel: ?No results for input(s): VITAMINB12, FOLATE, FERRITIN, TIBC, IRON, RETICCTPCT in the last 72 hours. ?Sepsis Labs: ?No results for input(s): PROCALCITON, LATICACIDVEN in the last 168 hours. ? ?Recent Results (from the past 240 hour(s))  ?Blood culture (routine x 2)     Status: None  ? Collection Time: 11/08/21   6:00 PM  ? Specimen: BLOOD  ?Result Value Ref Range Status  ? Specimen Description BLOOD  Final  ? Special Requests   Final  ?  BOTTLES DRAWN AEROBIC AND ANAEROBIC Blood Culture results may not be optimal due to an

## 2021-11-15 NOTE — Progress Notes (Signed)
Initial Nutrition Assessment ? ?DOCUMENTATION CODES:  ? ?Non-severe (moderate) malnutrition in context of acute illness/injury (acute on chronic malnutrition) ? ?INTERVENTION:  ? ?Tube Feeding via Cortrak: ?Osmolite 1.5 at 45 ml/hr ?Begin TF at 20 ml/hr; titrate by 10 mL q 8 hours until goal rate of 45 ml/hr ?Pro-Source TF 45 mL BID ? ?Recommend scheduled bowel regimen given no BM in 5 days ? ?Recommend liberalizing diet to REGULAR until pt demonstrating tolerance if po intake and consuming po adequately ? ?Renal MVI daily ? ?Thiamine 100 mg daily x 7 days ? ?Noted B-12, Folate, Vitamin E have been checked; recommend also checking Vitamin B6, Copper, Zinc, CRP ? ?NUTRITION DIAGNOSIS:  ? ?Moderate Malnutrition related to acute illness, chronic illness (acute enncephalopathy with refusal to take po, ESRD/HD) as evidenced by mild fat depletion, moderate fat depletion. ? ?GOAL:  ? ?Patient will meet greater than or equal to 90% of their needs ? ?MONITOR:  ? ?TF tolerance, PO intake, Labs, Weight trends, Skin ? ?REASON FOR ASSESSMENT:  ? ?Consult ?Enteral/tube feeding initiation and management ? ?ASSESSMENT:  ? ?81 yo female admitted with acute metabolic encephalopathy, malignant otitis externa. PMH includes ESRD on HD, CAD/CABG, hx colon and ovarian cancer ? ?Plan for iHD today ? ?Pt is confused; refusing all meds and oral intake. Cortrak inserted ?Renal Diet ordered; pt not taking any po intake.  ? ?On visit today, pt is alert, does not communicate with RD. Pt with mittens and soft restraints in place ?Unable to obtain diet and weight history from patient ? ?+constipation, last BM 5/3 ? ?EDW 56.5 kg; current wt 54.8 kg (under EDW) ? ?Vitamin Labs:  ?B-12: 319 (wdL) ?Folate: >40 (wdl) ?Vitamin E: pending ?CRP: not checked ? ?Labs: potassium 3.8 (wdl), phosphorus 4.6 (wdl), magnesium 1.8 (wdl) ?Meds: Rena-Vite, renvela, B12 injections ? ? ?NUTRITION - FOCUSED PHYSICAL EXAM: ? ?Flowsheet Row Most Recent Value  ?Orbital  Region Mild depletion  ?Upper Arm Region Mild depletion  ?Thoracic and Lumbar Region Unable to assess  ?Buccal Region Mild depletion  ?Temple Region Moderate depletion  ?Clavicle Bone Region Moderate depletion  ?Clavicle and Acromion Bone Region Mild depletion  ?Scapular Bone Region Mild depletion  ?Dorsal Hand Unable to assess  ?Patellar Region Severe depletion  ?Anterior Thigh Region Severe depletion  ?Posterior Calf Region Moderate depletion  ? ?  ? ? ?Diet Order:  RENAL DIET with fluid restriction ? ? ?EDUCATION NEEDS:  ? ?Not appropriate for education at this time ? ?Skin:  Skin Assessment: Reviewed RN Assessment ? ?Last BM:  5/3 ? ?Height:  ? ?Ht Readings from Last 1 Encounters:  ?11/09/21 '5\' 1"'$  (1.549 m)  ? ? ?Weight:  ? ?Wt Readings from Last 1 Encounters:  ?11/15/21 56.8 kg  ? ? ?BMI:  Body mass index is 23.66 kg/m?. ? ?Estimated Nutritional Needs:  ? ?Kcal:  1640-1810 kcals ? ?Protein:  80-90 g ? ?Fluid:  1000 mL plus UOP ? ? ?Kerman Passey MS, RDN, LDN, CNSC ?Registered Dietitian III ?Clinical Nutrition ?RD Pager and On-Call Pager Number Located in [place]  ? ?

## 2021-11-15 NOTE — Progress Notes (Signed)
?Shawna [name] ?Progress Note  ? ?Subjective:   Seen in room, awake and alert but not answering any questions.  ? ?Objective ?Vitals:  ? 11/14/21 2000 11/14/21 2337 11/15/21 0400 11/15/21 0748  ?BP:  (!) 133/52 138/78 (!) 159/49  ?Pulse:  (!) 57 60 (!) 55  ?Resp:  19  20  ?Temp:  97.8 ?F (36.6 ?C) 97.8 ?F (36.6 ?C) 97.6 ?F (36.4 ?C)  ?TempSrc:  Axillary Axillary Axillary  ?SpO2: 96%  96%   ?Weight:      ?Height:      ? ?Physical Exam ?General: Elderly female, alert, appears comfortable  ?Heart:RRR, no murmurs, rubs or gallops ?Lungs: CTA bilaterally without wheezing, rhonchi or rales ?Abdomen: Soft,non-tender, non-distended, +BS ?Extremities: No edema b/l lower extremities ?Dialysis Access: LUE AVF + bruit ? ?Additional Objective ?Labs: ?Basic Metabolic Panel: ?Recent Labs  ?Lab 11/12/21 ?1610 11/13/21 ?9604 11/14/21 ?0017  ?NA 141 138 139  ?K 4.3 3.8 4.3  ?CL 104 99 101  ?CO2 '23 28 22  '$ ?GLUCOSE 82 80 92  ?BUN 17 11 26*  ?CREATININE 4.23* 2.95* 5.09*  ?CALCIUM 9.2 9.4 9.1  ? ?Liver Function Tests: ?No results for input(s): AST, ALT, ALKPHOS, BILITOT, PROT, ALBUMIN in the last 168 hours. ?No results for input(s): LIPASE, AMYLASE in the last 168 hours. ?CBC: ?Recent Labs  ?Lab 11/10/21 ?0455 11/11/21 ?0701 11/12/21 ?5409 11/13/21 ?8119 11/14/21 ?0017  ?WBC 7.7 8.7 7.3 7.9 7.7  ?HGB 6.3* 9.7* 10.4* 10.0* 10.1*  ?HCT 19.3* 29.2* 31.2* 31.7* 32.2*  ?MCV 106.6* 98.6 98.4 100.3* 102.2*  ?PLT PLATELET CLUMPS NOTED ON SMEAR, UNABLE TO ESTIMATE 160 PLATELET CLUMPS NOTED ON SMEAR, UNABLE TO ESTIMATE PLATELET CLUMPS NOTED ON SMEAR, UNABLE TO ESTIMATE 187  ? ?Blood Culture ?   ?Component Value Date/Time  ? SDES EAR 11/09/2021 1530  ? SPECREQUEST  11/09/2021 1530  ?  LEFT EAR ?Performed at [facility], [place] [address], [place], [place] [address] ?  ? CULT RARE PSEUDOMONAS AERUGINOSA 11/09/2021 1530  ? REPTSTATUS 11/11/2021 FINAL 11/09/2021 1530  ? ? ?Cardiac Enzymes: ?No results for input(s): CKTOTAL, CKMB,  CKMBINDEX, TROPONINI in the last 168 hours. ?CBG: ?Recent Labs  ?Lab 11/08/21 ?1657 11/12/21 ?1930 11/15/21 ?0759  ?GLUCAP 103* 83 111*  ? ?Iron Studies: No results for input(s): IRON, TIBC, TRANSFERRIN, FERRITIN in the last 72 hours. ?'@lablastinr3'$ @ ?Studies/Results: ?EEG adult ? ?Result Date: 11/13/2021 ?Lora Havens, MD     11/13/2021 10:51 AM Patient Name: Shawna [name] MRN: [id number] Epilepsy Attending: Lora Havens Referring Physician/Provider: Albertine Patricia, MD Date: 11/13/2021 Duration: 21.10 mins Patient history: 81yo F with ams. EEG to evaluate for seizure Level of alertness: Awake AEDs during EEG study: Ativan Technical aspects: This EEG study was done with scalp electrodes positioned according to the 10-20 International system of electrode placement. Electrical activity was acquired at a sampling rate of '500Hz'$  and reviewed with a high frequency filter of '70Hz'$  and a low frequency filter of '1Hz'$ . EEG data were recorded continuously and digitally stored. Description: No posterior dominant rhythm was seen. EEG showed continuous generalized 3 to 6 Hz theta-delta slowing. Generalized periodic discharges with triphasic morphology at 1-'2Hz'$  were also noted, more prominent when awake/stimulated. Hyperventilation and photic stimulation were not performed.   ABNORMALITY - Periodic discharges with triphasic morphology, generalized ( GPDs) - Continuous slow, generalized IMPRESSION: This study showed generalized periodic discharges with triphasic morphology at  1-'2Hz'$  which can be on the ictal-interictal continuum. However, the morphology, frequency and  reactivity to stimulation is more likely indicative of toxic-metabolic causes. Additionally there is moderate diffuse encephalopathy, nonspecific etiology. No seizures were seen throughout the recording. Shawna [name]  ? ?Overnight EEG with video ? ?Result Date: 11/14/2021 ?Lora Havens, MD     11/14/2021  7:38 PM Patient Name: Shawna [name] MRN: [id number]  Epilepsy Attending: Lora Havens Referring Physician/Provider:  August Albino, NP Duration: 11/13/2021 1747 to 11/14/2021 1535  Patient history: 81yo F with ams. EEG to evaluate for seizure  Level of alertness: Awake, asleep  AEDs during EEG study: None  Technical aspects: This EEG study was done with scalp electrodes positioned according to the 10-20 International system of electrode placement. Electrical activity was acquired at a sampling rate of '500Hz'$  and reviewed with a high frequency filter of '70Hz'$  and a low frequency filter of '1Hz'$ . EEG data were recorded continuously and digitally stored.  Description: No posterior dominant rhythm was seen. Sleep was characterized by vertex waves, sleep spindles (12-'14hz'$ ), maximal frontocentral region. EEG showed continuous generalized 3 to 6 Hz theta-delta slowing. Generalized periodic discharges with triphasic morphology at 1-'2Hz'$  were also noted, more prominent when awake/stimulated. Hyperventilation and photic stimulation were not performed.    ABNORMALITY - Periodic discharges with triphasic morphology, generalized ( GPDs) - Continuous slow, generalized  IMPRESSION: This study showed generalized periodic discharges with triphasic morphology at  1-'2Hz'$  which can be on the ictal-interictal continuum. However, the morphology, frequency and reactivity to stimulation is more likely indicative of toxic-metabolic causes. Additionally there is moderate diffuse encephalopathy, nonspecific etiology. No seizures were seen throughout the recording.  Shawna [name]    ?Medications: ? sodium chloride    ? sodium chloride    ? sodium chloride Stopped (11/12/21 1719)  ? sodium chloride    ? sodium chloride    ? sodium chloride 50 mL/hr at 11/14/21 1239  ? amiodarone 30 mg/hr (11/15/21 0326)  ? diphenhydrAMINE 12.5 mg (11/14/21 2053)  ? piperacillin-tazobactam (ZOSYN)  IV 2.25 g (11/15/21 0115)  ? ? Chlorhexidine Gluconate Cloth  6 each Topical Q0600  ? cyanocobalamin  1,000 mcg  Intramuscular Q0600  ? Followed by  ? [START ON 11/21/2021] cyanocobalamin  1,000 mcg Intramuscular Weekly  ? heparin injection (subcutaneous)  5,000 Units Subcutaneous Q8H  ? levothyroxine  25 mcg Oral Q0600  ? LORazepam  0.5 mg Intravenous QHS  ? metoprolol tartrate  5 mg Intravenous Q8H  ? midodrine  5-10 mg Oral Q M,W,F-HD  ? multivitamin  1 tablet Oral Daily  ? rosuvastatin  10 mg Oral Daily  ? sevelamer carbonate  2,400 mg Oral TID WC  ? tobramycin-dexamethasone  2 drop Left EAR Q6H  ? ? ?Dialysis Orders: ?[facility] MWF ?  3.5h 56.5kg   BFR 300   2/2.5 bath LUA AVF  Heparin none ? - venofer 50 mg q Monday ? - sensipar 30 mg tiw ? - calcitriol 1.75 ug po tiw ? - mircera 200 ug q 2wks, last 4/25 ?- 5/01 lab showed HBsAg neg w/ Abs > 10/ protective ? ?Assessment/Plan: ?AMS/ gen'd weakness: multifactorial sepsis + polypharmacy. Has good HD compliance. Appears ABX are stopped. Continue to hol gabapentin, oxycodone and xanax.  ?L ear mastoiditis: +pseudomonas L ear cx, was IV cefepime and now on ciprodex ear drops per ENT ?Atrial fib - w/ RVR here. Started on IV amio now. RRR on exam.  ?ESRD: HD MWF. Next HD 11/15/21. She has been confused but able to tolerate dialysis ?Hypokalemia -  resolved, K+ 4.3.  ?BP/vol: BP's controlled, at dry wt, euvolemic on exam. Not on any BP lowering meds. Gets midodrine pre HD tiw. ?Anemia of ESRD: Hgb now 10.1, last esa was high-dose OP on 4/25. Next due due 5/09. Transfuse prn. Sp 2u prbc's 5/03.  ?Metabolic Bone Disease: Will check RFP with HD. Continue binder with meals.  ?Hx CAD/ sp CABG - on statin ?Hx of colon and ovarian cancer ? ?Anice Paganini, PA-C ?11/15/2021, 8:45 AM  ?[name] ?Pager: [phone] ? ? ?

## 2021-11-15 NOTE — Progress Notes (Signed)
PT Cancellation Note ? ?Patient Details ?Name: Shawna [name] ?MRN: [id number] ?DOB: 1940/12/12 ? ? ?Cancelled Treatment:    Reason Eval/Treat Not Completed: Patient at procedure or test/unavailable ? ?Patient remains in dialysis.  ? ?Arby Barrette, PT ?Acute Rehabilitation Services  ?Pager [phone] ?Office [phone] ? ? ?[name] ?11/15/2021, 3:02 PM ?

## 2021-11-15 NOTE — Progress Notes (Signed)
Subjective: ?Patient laying in bed, pleasantly confused in NAD. No family at bedside  ? ?Exam: ?Vitals:  ? 11/15/21 0400 11/15/21 0748  ?BP: 138/78 (!) 159/49  ?Pulse: 60 (!) 55  ?Resp:  20  ?Temp: 97.8 ?F (36.6 ?C) 97.6 ?F (36.4 ?C)  ?SpO2: 96%   ? ?Gen: In bed, NAD ?Resp: non-labored breathing, no acute distress ?Abd: soft, nt ? ?Neuro: ?MS: Awake, she laughs inappropriately, she answers her name, but does not answer any orientation questions.  She does not name any objects for me. She will not follow commands consistently.  ?CN: she crosses midline and will track me. Face appears symmetric. ?Motor: She moves all extremities antigravity and spontaneously ?Sensory: responds to noxious stimuli  ? ?Pertinent Labs: ?B12 319 ?RPR negative ?Pending labs: Vit E, methylmalonic acid, and homocysteine ? ?Impression: 81 year old female with acute encephalopathy in the setting of Pseudomonas otomastoiditis.  She has no headache, no meningismus to suggest CNS spread.  My suspicion at this time is that she initially had a mild septic associated encephalopathy and subsequently after being started on cefepime, her mental status declined, which in the setting of renal failure is highly suspicious for septic being associated neurotoxicity. ? ?She has been changed from cefepime, was then changed to ceftazidime but never received a dose and is now on Zoysn ? ?EEG consistent with encephalopathy, no evidence of seizure like activity. ? ?Recommendations: ?- Delirium precautions ?- avoid sedating and deliriogenic medications when possible  ?-  neurology will follow ? ?Beulah Gandy DNP, ACNPC-AG  ? ?

## 2021-11-16 ENCOUNTER — Ambulatory Visit (INDEPENDENT_AMBULATORY_CARE_PROVIDER_SITE_OTHER): Payer: Medicare HMO | Admitting: Gastroenterology

## 2021-11-16 ENCOUNTER — Ambulatory Visit: Admission: RE | Admit: 2021-11-16 | Payer: Medicare HMO | Source: Home / Self Care | Admitting: Vascular Surgery

## 2021-11-16 ENCOUNTER — Encounter: Admission: RE | Payer: Self-pay | Source: Home / Self Care

## 2021-11-16 DIAGNOSIS — Z992 Dependence on renal dialysis: Secondary | ICD-10-CM | POA: Diagnosis not present

## 2021-11-16 DIAGNOSIS — N186 End stage renal disease: Secondary | ICD-10-CM | POA: Diagnosis not present

## 2021-11-16 DIAGNOSIS — H6092 Unspecified otitis externa, left ear: Secondary | ICD-10-CM | POA: Diagnosis not present

## 2021-11-16 DIAGNOSIS — G9340 Encephalopathy, unspecified: Secondary | ICD-10-CM | POA: Diagnosis not present

## 2021-11-16 LAB — PHOSPHORUS
Phosphorus: 2.8 mg/dL (ref 2.5–4.6)
Phosphorus: 3.1 mg/dL (ref 2.5–4.6)

## 2021-11-16 LAB — CBC
HCT: 31.2 % — ABNORMAL LOW (ref 36.0–46.0)
Hemoglobin: 9.9 g/dL — ABNORMAL LOW (ref 12.0–15.0)
MCH: 32 pg (ref 26.0–34.0)
MCHC: 31.7 g/dL (ref 30.0–36.0)
MCV: 101 fL — ABNORMAL HIGH (ref 80.0–100.0)
Platelets: UNDETERMINED 10*3/uL (ref 150–400)
RBC: 3.09 MIL/uL — ABNORMAL LOW (ref 3.87–5.11)
RDW: 20.3 % — ABNORMAL HIGH (ref 11.5–15.5)
WBC: 6.1 10*3/uL (ref 4.0–10.5)
nRBC: 0 % (ref 0.0–0.2)

## 2021-11-16 LAB — GLUCOSE, CAPILLARY
Glucose-Capillary: 106 mg/dL — ABNORMAL HIGH (ref 70–99)
Glucose-Capillary: 106 mg/dL — ABNORMAL HIGH (ref 70–99)
Glucose-Capillary: 122 mg/dL — ABNORMAL HIGH (ref 70–99)
Glucose-Capillary: 130 mg/dL — ABNORMAL HIGH (ref 70–99)
Glucose-Capillary: 147 mg/dL — ABNORMAL HIGH (ref 70–99)
Glucose-Capillary: 168 mg/dL — ABNORMAL HIGH (ref 70–99)

## 2021-11-16 LAB — VITAMIN E
Vitamin E (Alpha Tocopherol): 5.2 mg/L — ABNORMAL LOW (ref 9.0–29.0)
Vitamin E(Gamma Tocopherol): 1.8 mg/L (ref 0.5–4.9)

## 2021-11-16 LAB — BASIC METABOLIC PANEL
Anion gap: 12 (ref 5–15)
BUN: 21 mg/dL (ref 8–23)
CO2: 26 mmol/L (ref 22–32)
Calcium: 9.2 mg/dL (ref 8.9–10.3)
Chloride: 99 mmol/L (ref 98–111)
Creatinine, Ser: 4.33 mg/dL — ABNORMAL HIGH (ref 0.44–1.00)
GFR, Estimated: 10 mL/min — ABNORMAL LOW (ref >60)
Glucose, Bld: 138 mg/dL — ABNORMAL HIGH (ref 70–99)
Potassium: 3.7 mmol/L (ref 3.5–5.1)
Sodium: 137 mmol/L (ref 135–145)

## 2021-11-16 LAB — MAGNESIUM
Magnesium: 1.9 mg/dL (ref 1.7–2.4)
Magnesium: 1.9 mg/dL (ref 1.7–2.4)

## 2021-11-16 SURGERY — A/V FISTULAGRAM
Anesthesia: Moderate Sedation | Laterality: Left

## 2021-11-16 MED ORDER — ROSUVASTATIN CALCIUM 5 MG PO TABS
10.0000 mg | ORAL_TABLET | Freq: Every day | ORAL | Status: DC
  Administered 2021-11-16 – 2021-11-24 (×8): 10 mg
  Filled 2021-11-16 (×8): qty 2

## 2021-11-16 MED ORDER — SENNOSIDES-DOCUSATE SODIUM 8.6-50 MG PO TABS
1.0000 | ORAL_TABLET | Freq: Two times a day (BID) | ORAL | Status: DC
  Administered 2021-11-16 – 2021-11-23 (×7): 1
  Filled 2021-11-16 (×12): qty 1

## 2021-11-16 MED ORDER — HYDROXYZINE HCL 25 MG PO TABS
25.0000 mg | ORAL_TABLET | Freq: Three times a day (TID) | ORAL | Status: DC | PRN
  Administered 2021-11-17 – 2021-11-23 (×5): 25 mg
  Filled 2021-11-16 (×7): qty 1

## 2021-11-16 MED ORDER — CHLORHEXIDINE GLUCONATE CLOTH 2 % EX PADS
6.0000 | MEDICATED_PAD | Freq: Every day | CUTANEOUS | Status: DC
  Administered 2021-11-16: 6 via TOPICAL

## 2021-11-16 MED ORDER — RENA-VITE PO TABS
1.0000 | ORAL_TABLET | Freq: Every day | ORAL | Status: DC
  Administered 2021-11-16 – 2021-11-24 (×8): 1
  Filled 2021-11-16 (×8): qty 1

## 2021-11-16 MED ORDER — LEVOTHYROXINE SODIUM 25 MCG PO TABS
25.0000 ug | ORAL_TABLET | Freq: Every day | ORAL | Status: DC
  Administered 2021-11-17 – 2021-11-24 (×6): 25 ug
  Filled 2021-11-16 (×8): qty 1

## 2021-11-16 MED ORDER — MIDODRINE HCL 5 MG PO TABS
5.0000 mg | ORAL_TABLET | ORAL | Status: DC
  Administered 2021-11-17: 5 mg
  Filled 2021-11-16: qty 1

## 2021-11-16 NOTE — Progress Notes (Signed)
Occupational Therapy Treatment ?Patient Details ?Name: Shawna [name] ?MRN: [id number] ?DOB: 13-Feb-1941 ?Today's Date: 11/16/2021 ? ? ?History of present illness Pt is an 81 year old woman admitted on 11/09/21 with AMS and fever with symptoms of mastoiditis and otitis media. Brain MRI negative for acute changes Hospital course complicated by anemia, requiring transfusion.  EEG shows encephalopathy.   PMH: CHF, ESRD, GERD, HLD, HTN, chronic anemia, PAF, CAD s/p CABG. ?  ?OT comments ? Pt still with decreased focused attention to any functional tasks during OT.  She needed max assist hand over hand for washing her face and was still resistant to therapist helping her secondary to not understanding the task cognitively.  She could maintain sitting balance EOB with min guard after total assist transfer supine to sit.  She was only oriented to person and was mostly non-verbal when questioned.  Will continue to follow for acute care OT needs with transition to SNF still recommended for further rehab once medically stable.    ? ?Recommendations for follow up therapy are one component of a multi-disciplinary discharge planning process, led by the attending physician.  Recommendations may be updated based on patient status, additional functional criteria and insurance authorization. ?   ?Follow Up Recommendations ? Skilled nursing-short term rehab (<3 hours/day)  ?  ?Assistance Recommended at Discharge Frequent or constant Supervision/Assistance  ?Patient can return home with the following ? A lot of help with bathing/dressing/bathroom;Direct supervision/assist for medications management;Assistance with feeding;Assistance with cooking/housework;Direct supervision/assist for financial management;Assist for transportation;Help with stairs or ramp for entrance;A lot of help with walking and/or transfers ?  ?Equipment Recommendations ? Other (comment) (To be determined next venue of care)  ?  ?   ?Precautions / Restrictions  Precautions ?Precautions: Fall ?Precaution Comments: feeding tube, currently needs mitts on secondary to confusion ?Restrictions ?Weight Bearing Restrictions: No  ? ? ?  ? ?Mobility Bed Mobility ?Overal bed mobility: Needs Assistance ?Bed Mobility: Supine to Sit, Sit to Supine ?  ?  ?Supine to sit: Max assist ?Sit to supine: Max assist ?  ?General bed mobility comments: Pt needed assist with all aspects of supine to sit secondary to not initiating it with tactile and verbal cueing. She was then resistant to bringing trunk back down to the bed when laying down. ?  ? ?Transfers ?Overall transfer level: Needs assistance ?  ?Transfers: Sit to/from Stand ?Sit to Stand: Max assist ?  ?  ?  ?  ?  ?General transfer comment: Pt assisted with standing X1 when therapist initiated it, but then tried to sit down before taking steps up toward the top of the bed.  Total assist needed to take half a step up and then sit back down. ?  ?  ?Balance Overall balance assessment: Needs assistance ?Sitting-balance support: Feet supported ?Sitting balance-Leahy Scale: Fair ?  ?  ?Standing balance support: Bilateral upper extremity supported ?Standing balance-Leahy Scale: Zero ?  ?  ?  ?  ?  ?  ?  ?  ?  ?  ?  ?  ?   ? ?ADL either performed or assessed with clinical judgement  ? ?ADL Overall ADL's : Needs assistance/impaired ?  ?  ?Grooming: Wash/dry face;Sitting;Maximal assistance ?  ?  ?  ?  ?  ?  ?  ?  ?  ?  ?  ?  ?  ?  ?  ?  ?General ADL Comments: Pt needed max assist for supine to sit.  Once sitting, she could  maintain sitting balance with min guard assist.  Pt continually trying to pull at her feeding tube as well as scratch her back and chest.  No ability to comprehend or follow any one step functional commands.  Max hand over hand for attempted washing face, however pt resistant.  She was able to state her name and when given choices of where she was "a bank, a church, a hospital", she picked church.  She was then not verbalize any  further answers to questions, just the occasional "what's going on here?", when her tube would could pulled slightly. ?  ? ? ?   ?   ?   ? ?Cognition Arousal/Alertness: Lethargic ?Behavior During Therapy: Flat affect, Restless ?Overall Cognitive Status: Impaired/Different from baseline ?Area of Impairment: Orientation, Attention, Memory, Following commands, Awareness ?  ?  ?  ?  ?  ?  ?  ?  ?Orientation Level: Disoriented to, Person, Place, Time, Situation ?Current Attention Level: Focused ?Memory: Decreased short-term memory ?Following Commands:  (Does not follow one step commands related to simple grooming tasks or for bed mobility) ?Safety/Judgement: Decreased awareness of safety, Decreased awareness of deficits ?Awareness: Intellectual ?Problem Solving: Decreased initiation, Requires tactile cues, Requires verbal cues, Slow processing ?General Comments: Pt unable to initiate or follow any commands with grooming tasks, bed mobility, or transition to sitting.  She needed max hand over hand for attempt to wash face but was resistant to this secondary to not understanding the directions. ?  ?  ?   ?   ?   ?   ? ? ?Pertinent Vitals/ Pain       Pain Assessment ?Pain Assessment: Faces ?Faces Pain Scale: No hurt ? ?   ?    ?  ?  ?  ?   ? ?Frequency ? Min 2X/week  ? ? ? ? ?  ?Progress Toward Goals ? ?OT Goals(current goals can now be found in the care plan section) ? Progress towards OT goals: Not progressing toward goals - comment (Will continue to assess in next treatment, may need to be downgraded based on cognition and ability to actively participate.) ? ?Acute Rehab OT Goals ?Time For Goal Achievement: 11/30/21 ?Potential to Achieve Goals: Fair  ?Plan Discharge plan remains appropriate   ? ?   ?AM-PAC OT "6 Clicks" Daily Activity     ?Outcome Measure ? ? Help from another person eating meals?: A Lot ?Help from another person taking care of personal grooming?: Total ?Help from another person toileting, which includes  using toliet, bedpan, or urinal?: Total ?Help from another person bathing (including washing, rinsing, drying)?: Total ?Help from another person to put on and taking off regular upper body clothing?: Total ?Help from another person to put on and taking off regular lower body clothing?: Total ?6 Click Score: 7 ? ?  ?End of Session   ? ?OT Visit Diagnosis: Unsteadiness on feet (R26.81);Muscle weakness (generalized) (M62.81);Other symptoms and signs involving cognitive function ?  ?Activity Tolerance Patient limited by lethargy;Other (comment) (limited by cognition and attention) ?  ?Patient Left   ?  ?Nurse Communication Other (comment) (Pt participation in session) ?  ? ?   ? ?Time: 5366-4403 ?OT Time Calculation (min): 32 min ? ?Charges: OT General Charges ?$OT Visit: 1 Visit ?OT Treatments ?$Self Care/Home Management : 23-37 mins ? ?Allysson Rinehimer OTR/L ?11/16/2021, 10:37 AM ?

## 2021-11-16 NOTE — Progress Notes (Signed)
Subjective: ?Patient laying in bed, pleasantly confused in NAD. No family at bedside  ? ?Exam: ?Vitals:  ? 11/16/21 0300 11/16/21 0753  ?BP: (!) 153/53 (!) 133/46  ?Pulse: (!) 58   ?Resp: 19 17  ?Temp: (!) 97.2 ?F (36.2 ?C) 98.2 ?F (36.8 ?C)  ?SpO2: 98%   ? ?Gen: In bed, NAD ?Resp: non-labored breathing, no acute distress ?Abd: soft, nt ? ?Neuro: ?MS: Poor attention. Awake, she laughs inappropriately, she answers her name, was able to tell me her birth month and day, but does not answer any other orientation questions.  Answers "Yes Ma'am" to a lot of questions. Today she is able to name a few objects like, glasses and cup, very hard to keep her attention on me She will not follow commands consistently.  ?CN: she crosses midline and will track me. Face appears symmetric. ?Motor: She moves all extremities antigravity and spontaneously ?Sensory: responds to noxious stimuli  ? ?Pertinent Labs: ?B12 319 ?B6 pending  ?RPR negative ?TSH 4.203 ?Pending labs: Vit E, methylmalonic acid, and homocysteine, copper  ? ?Impression: 81 year old female with acute encephalopathy in the setting of Pseudomonas otomastoiditis.  She has no headache, no meningismus to suggest CNS spread.  My suspicion at this time is that she initially had a mild septic associated encephalopathy and subsequently after being started on cefepime, her mental status declined, which in the setting of renal failure is highly suspicious for septic being associated neurotoxicity. ? ?She has been changed from cefepime, was then changed to ceftazidime but never received a dose and is now on Zoysn ? ?EEG consistent with encephalopathy, no evidence of seizure like activity. ? ?Recommendations: ?- Delirium precautions ?- avoid sedating and deliriogenic medications when possible  ?- Neurology will sign off. Some improvement in mentation today from yesterday with change in antibiotics, however If mental status does not return to baseline with correction of metabolic  abnormalities or with treatment of infection please reconsult [place]  ? ?Beulah Gandy DNP, ACNPC-AG  ? ?

## 2021-11-16 NOTE — Progress Notes (Signed)
?[facility] ?Progress Note  ? ?Subjective:   Tolerated HD yesterday with net UF 1L. Awake and alert this AM, smiling but still not conversing.  ? ?Objective ?Vitals:  ? 11/15/21 2310 11/16/21 0300 11/16/21 0446 11/16/21 0753  ?BP: (!) 143/46 (!) 153/53  (!) 133/46  ?Pulse: 60 (!) 58    ?Resp: '18 19  17  '$ ?Temp: 98.9 ?F (37.2 ?C) (!) 97.2 ?F (36.2 ?C)  98.2 ?F (36.8 ?C)  ?TempSrc: Axillary Axillary  Oral  ?SpO2: 97% 98%    ?Weight:   59.4 kg   ?Height:      ? ?Physical Exam ?General: Elderly female, alert, appears comfortable. NG tube in place ?Heart:RRR, no murmurs, rubs or gallops ?Lungs: CTA bilaterally without wheezing, rhonchi or rales ?Abdomen: Soft,non-tender, non-distended, +BS ?Extremities: No edema b/l lower extremities ?Dialysis Access: LUE AVF + bruit ? ?Additional Objective ?Labs: ?Basic Metabolic Panel: ?Recent Labs  ?Lab 11/14/21 ?0017 11/15/21 ?1305 11/15/21 ?1704 11/16/21 ?1601  ?NA 139 141  --  137  ?K 4.3 3.8  --  3.7  ?CL 101 102  --  99  ?CO2 22 20*  --  26  ?GLUCOSE 92 111*  --  138*  ?BUN 26* 42*  --  21  ?CREATININE 5.09* 7.99*  --  4.33*  ?CALCIUM 9.1 8.7*  --  9.2  ?PHOS  --  4.6 1.8* 2.8  ? ?Liver Function Tests: ?Recent Labs  ?Lab 11/15/21 ?1305  ?ALBUMIN 2.3*  ? ?No results for input(s): LIPASE, AMYLASE in the last 168 hours. ?CBC: ?Recent Labs  ?Lab 11/12/21 ?0932 11/13/21 ?3557 11/14/21 ?0017 11/15/21 ?1306 11/16/21 ?0618  ?WBC 7.3 7.9 7.7 9.4 6.1  ?HGB 10.4* 10.0* 10.1* 9.1* 9.9*  ?HCT 31.2* 31.7* 32.2* 28.3* 31.2*  ?MCV 98.4 100.3* 102.2* 101.4* 101.0*  ?PLT PLATELET CLUMPS NOTED ON SMEAR, UNABLE TO ESTIMATE PLATELET CLUMPS NOTED ON SMEAR, UNABLE TO ESTIMATE 187 248 PLATELET CLUMPS NOTED ON SMEAR, UNABLE TO ESTIMATE  ? ?Blood Culture ?   ?Component Value Date/Time  ? SDES EAR 11/09/2021 1530  ? SPECREQUEST  11/09/2021 1530  ?  LEFT EAR ?Performed at [facility], [place] [name], [place], [place] [address] ?  ? CULT RARE PSEUDOMONAS AERUGINOSA 11/09/2021 1530  ?  REPTSTATUS 11/11/2021 FINAL 11/09/2021 1530  ? ? ?Cardiac Enzymes: ?No results for input(s): CKTOTAL, CKMB, CKMBINDEX, TROPONINI in the last 168 hours. ?CBG: ?Recent Labs  ?Lab 11/15/21 ?1211 11/15/21 ?1938 11/15/21 ?2342 11/16/21 ?0355 11/16/21 ?0756  ?GLUCAP 119* 94 163* 130* 168*  ? ? ?Studies/Results: ?DG Abd Portable 1V ? ?Result Date: 11/15/2021 ?CLINICAL DATA:  Feeding tube placement. EXAM: PORTABLE ABDOMEN - 1 VIEW COMPARISON:  07/14/2017 FINDINGS: The tip of the feeding tube is in the distal stomach, near the pylorus and directed distally towards the duodenum. Visualized bowel gas in the upper abdomen is unremarkable. IMPRESSION: Feeding tube tip is at the pylorus directed towards the duodenum. Electronically Signed   By: Misty Stanley M.D.   On: 11/15/2021 11:08   ?Medications: ? sodium chloride    ? sodium chloride    ? sodium chloride Stopped (11/12/21 1719)  ? sodium chloride    ? sodium chloride    ? diphenhydrAMINE 12.5 mg (11/14/21 2053)  ? feeding supplement (OSMOLITE 1.5 CAL) 1,000 mL (11/16/21 0222)  ? piperacillin-tazobactam (ZOSYN)  IV 2.25 g (11/16/21 0220)  ? ? amiodarone  400 mg Per Tube BID  ? Chlorhexidine Gluconate Cloth  6 each Topical Q0600  ?  cyanocobalamin  1,000 mcg Intramuscular Q0600  ? Followed by  ? [START ON 11/21/2021] cyanocobalamin  1,000 mcg Intramuscular Weekly  ? feeding supplement (PROSource TF)  45 mL Per Tube BID  ? heparin injection (subcutaneous)  5,000 Units Subcutaneous Q8H  ? [START ON 11/17/2021] levothyroxine  25 mcg Per Tube Q0600  ? LORazepam  0.5 mg Intravenous QHS  ? metoprolol tartrate  25 mg Per Tube BID  ? [START ON 11/17/2021] midodrine  5-10 mg Per Tube Q M,W,F-HD  ? multivitamin  1 tablet Per Tube Daily  ? rosuvastatin  10 mg Per Tube Daily  ? senna-docusate  1 tablet Per Tube BID  ? sevelamer carbonate  2,400 mg Oral TID WC  ? thiamine  100 mg Per NG tube Daily  ? tobramycin-dexamethasone  2 drop Left EAR Q6H  ? ? ?Dialysis Orders: ?[facility] MWF ?  3.5h  56.5kg   BFR 300   2/2.5 bath LUA AVF  Heparin none ? - venofer 50 mg q Monday ? - sensipar 30 mg tiw ? - calcitriol 1.75 ug po tiw ? - mircera 200 ug q 2wks, last 4/25 ?- 5/01 lab showed HBsAg neg w/ Abs > 10/ protective ? ?Assessment/Plan: ?AMS/ gen'd weakness: multifactorial sepsis + polypharmacy vs cephalosporins. Has good HD compliance. Continue to hol gabapentin, oxycodone and xanax.  ?L ear mastoiditis: +pseudomonas L ear cx, was IV cefepime and now zosyn ?Atrial fib - w/ RVR here. Started on IV amio now. RRR on exam.  ?ESRD: HD MWF. Next HD 11/17/21. She has been confused but able to tolerate dialysis ?Hypokalemia - resolved, K+ 3.7. ?BP/vol: BP's controlled, at dry wt, euvolemic on exam. Not on any BP lowering meds. Gets midodrine pre HD tiw. ?Anemia of ESRD: Hgb now 9.9, last esa was high-dose OP on 4/25. Next due due 5/09- will order with HD tomorrow. Sp 2u prbc's 5/03.  ?Metabolic Bone Disease: Calcium controlled. Phos 1.8 -> 2.8, will stop binder for now ?Hx CAD/ sp CABG - on statin ?Hx of colon and ovarian cancer ? ?Anice Paganini, PA-C ?11/16/2021, 8:56 AM  ?[facility] ?Pager: [phone] ? ? ?

## 2021-11-16 NOTE — TOC Progression Note (Signed)
Transition of Care (TOC) - Progression Note  ? ? ?Patient Details  ?Name: Shawna [name] ?MRN: [id number] ?Date of Birth: Jun 30, 1941 ? ?Transition of Care (TOC) CM/SW Contact  ?Benard Halsted, LCSW ?Phone Number: ?11/16/2021, 3:35 PM ? ?Clinical Narrative:    ?CSW spoke with patient's spouse at bedside and he confirmed SNF choice of [facility]. CSW will follow for medical stability to time insurance process.  ? ? ?Expected Discharge Plan: [place] ?Barriers to Discharge: Continued Medical Work up, Ship broker, SNF Pending bed offer ? ?Expected Discharge Plan and Services ?Expected Discharge Plan: [place] ?In-house Referral: Clinical Social Work ?  ?Post Acute Care Choice: Dialysis, Skilled Nursing Facility ?Living arrangements for the past 2 months: [place] ?Expected Discharge Date: 11/11/21               ?  ?  ?  ?  ?  ?  ?  ?  ?  ?  ? ? ?Social Determinants of Health (SDOH) Interventions ?  ? ?Readmission Risk Interventions ? ?  11/12/2021  ?  8:56 AM 10/20/2021  ?  4:38 PM 07/23/2021  ?  4:26 PM  ?Readmission Risk Prevention Plan  ?Transportation Screening Complete Complete Complete  ?Medication Review Press photographer) Complete Complete Complete  ?PCP or Specialist appointment within 3-5 days of discharge Complete Complete Complete  ?[place] or Home Care Consult Complete Complete Complete  ?SW Recovery Care/Counseling Consult Complete Complete Complete  ?Palliative Care Screening Not Applicable Not Applicable Not Applicable  ?Skilled Nursing Facility Complete Not Applicable Not Applicable  ? ? ?

## 2021-11-16 NOTE — Progress Notes (Signed)
PT Cancellation Note ? ?Patient Details ?Name: Shawna [name] ?MRN: [id number] ?DOB: 01-17-1941 ? ? ?Cancelled Treatment:    Reason Eval/Treat Not Completed: Patient at procedure or test/unavailable ? ?Patient's lunch just recently arrived and family is feeding her. Will reattempt as schedule permits.  ? ? ?Arby Barrette, PT ?Acute Rehabilitation Services  ?Pager [phone] ?Office [phone] ? ? ?[name] ?11/16/2021, 2:24 PM ?

## 2021-11-16 NOTE — Progress Notes (Signed)
?PROGRESS NOTE ? ? ? ?Shawna [name]  VEH:[id number] DOB: 1940-11-13 DOA: 11/08/2021 ?PCP: [facility] ? ? ?Brief Narrative:  ? ?Shawna [name] is a 81 y.o. female with history of paroxysmal atrial fibrillation not on anticoagulation secondary to history of GI bleed, CAD status post CABG and stent placement, ESRD on hemodialysis on Monday Wednesday Friday, prior history of colon cancer, hyperlipidemia was brought to the ER after patient was found to be increasingly confused and weak over the last 24 hours.   ?-Patient with known left ear infection followed by ENT as an outpatient on eardrops, during hospital stay she was evaluated by ENT, were culture growing Pseudomonas, she was treated with IV cefepime, neurology were consulted, worsening encephalopathy to be secondary to cefepime, which has been stopped.  Patient mental status continues to worsening, with poor oral intake, refusing all meds she required cortrak tube insertion, encephalopathy thought to be secondary to infection, and cefepime antibiotics, she was followed closely by renal given she is ESRD patient where she was dialyzed on schedule ? ? ?Assessment & Plan: ?  ?Principal Problem: ?  Acute encephalopathy ?Active Problems: ?  Hx of CABG ?  Chronic systolic heart failure ([place]) ?  Essential hypertension ?  History of colon cancer ?  History of ovarian cancer ?  ESRD (end stage renal disease) on dialysis [facility]) ?  PAF (paroxysmal atrial fibrillation) ([place]) ?  Acute metabolic encephalopathy ? ? ?Acute metabolic encephalopathy, POA ?Hospital delirium ?-Multifactorial ?- possibly related to polypharmacy,  gabapentin, alprazolam and oxycodone on hold since admission ?-Continue to treat infectious etiology with malignant otitis external ?-As well possibly related to uremia for which she was dialyzed ?-No acute finding on MRI brain, but she is with extensive chronic small vessel ischemia, generalized brain atrophy which indicates some underlining  vascular dementia. ?-Neurology input greatly appreciated, EEG significant for triphasic waves, but LTM EEG with no evidence of seizures, her encephalopathy, please see discussion below . ?-Patient remains significantly confused, she is awake but refusing all meds and all oral intake, core track inserted today we will start on tube feed BM meds. ?-Patient with worsening mental confusion after starting cefepime, neurology input appreciated, this was felt to be secondary to cefepime which has been discontinued, and her mentation started to improve. ? ?Malignant otitis externa ?-ENT input greatly appreciated, low clinical suspicion for mastoiditis. ?-Previously treated outpatient with improving symptoms per patient and family (no longer having ear pain) ?-ENT input greatly appreciated, she has a wick placed, and started on TobraDex, and IV cefepime. ?-Proving, continue with TobraDex eardrops. ?-Showed recommendation for IV antibiotics x3 weeks. ?-IV cefepime has been discontinued given encephalopathy , try avoid ceftazidime for the same reason . ?-For now avoiding quinolones given prolonged QTc . ?-Apparently she tolerated Zosyn in 2017 despite her allergies, so she was started on Zosyn y 5/7, with no allergic reaction.   ? ? ?ESRD Monday Wednesday Friday ?-Missed dialysis Monday. ?- HD per renal  ? ?History of paroxysmal atrial fibrillation, uncontrolled, possible a flutter ?-  not on anticoagulation secondary to GI bleed  ?-cardiology input greatly appreciated, can transition to p.o. amiodarone once heart rate is more stable. ?- QTc is prolonged secondary to left bundle branch block, and her JTC<500, cardiology input appreciated. ?-Did require amiodarone drip for few days, now she has core track tube she cannot tolerate oral amiodarone, continue with amiodarone 400 mg twice daily x for 14 days then 200 mg daily per cardiology recommendation.   ?-  As well if blood pressure remains elevated will  increase her  metoprolol. ? ?History of CAD status post CABG  ?-continue home statin  ? ?Anemia of chronic kidney disease  ?- EPO, IV iron per renal.   ?-Received 2 units PRBC 5/3 for hemoglobin of 6.3, globin has been stable since transfusion. ? ?Hypokalemia ?-Management with HD ? ?Hypertension ?-Blood pressure well controlled currently ? ?History of colon cancer and ovarian cancer. -Completed previous therapy ?   ?DVT prophylaxis: Subcu heparin ?Code Status: Full code. ?Family Communication: None at bedside, discussed with husband by phone DAILY, I have discussed with him, and granddaughter at bedside today. ? ?Status is: Inpatient ? ?Dispo: The patient is from: Home  ?             Patient currently not medically stable for discharge, will need SNF placement, remains confused, on amiodarone drip. ? ?Consultants:  ?Nephrology, ENT,Neurology ? ?Procedures:  ?None ? ? ?Subjective: ? ?No significant events overnight, patient about to provide any reliable complaints ?Objective: ?Vitals:  ? 11/15/21 2310 11/16/21 0300 11/16/21 0446 11/16/21 0753  ?BP: (!) 143/46 (!) 153/53  (!) 133/46  ?Pulse: 60 (!) 58    ?Resp: '18 19  17  '$ ?Temp: 98.9 ?F (37.2 ?C) (!) 97.2 ?F (36.2 ?C)  98.2 ?F (36.8 ?C)  ?TempSrc: Axillary Axillary  Oral  ?SpO2: 97% 98%    ?Weight:   59.4 kg   ?Height:      ? ? ?Intake/Output Summary (Last 24 hours) at 11/16/2021 1037 ?Last data filed at 11/15/2021 1625 ?Gross per 24 hour  ?Intake --  ?Output 1000 ml  ?Net -1000 ml  ? ?Filed Weights  ? 11/15/21 1240 11/15/21 1625 11/16/21 0446  ?Weight: 56.8 kg 55.1 kg 59.4 kg  ? ? ?Examination: ? ?Awake, she remains confused, but appears to be improving, she is more interactive and communicative today ?Symmetrical Chest wall movement, Good air movement bilaterally, CTAB ?RRR,No Gallops,Rubs or new Murmurs, No Parasternal Heave ?+ve B.Sounds, Abd Soft, No tenderness, No rebound - guarding or rigidity. ?No Cyanosis, Clubbing or edema, No new Rash or bruise   ? ? ? ?Data Reviewed: I have  personally reviewed following labs and imaging studies ? ?CBC: ?Recent Labs  ?Lab 11/12/21 ?2951 11/13/21 ?8841 11/14/21 ?0017 11/15/21 ?1306 11/16/21 ?0618  ?WBC 7.3 7.9 7.7 9.4 6.1  ?HGB 10.4* 10.0* 10.1* 9.1* 9.9*  ?HCT 31.2* 31.7* 32.2* 28.3* 31.2*  ?MCV 98.4 100.3* 102.2* 101.4* 101.0*  ?PLT PLATELET CLUMPS NOTED ON SMEAR, UNABLE TO ESTIMATE PLATELET CLUMPS NOTED ON SMEAR, UNABLE TO ESTIMATE 187 248 PLATELET CLUMPS NOTED ON SMEAR, UNABLE TO ESTIMATE  ? ?Basic Metabolic Panel: ?Recent Labs  ?Lab 11/12/21 ?6606 11/13/21 ?3016 11/14/21 ?0017 11/15/21 ?1305 11/15/21 ?1357 11/15/21 ?1704 11/16/21 ?0109  ?NA 141 138 139 141  --   --  137  ?K 4.3 3.8 4.3 3.8  --   --  3.7  ?CL 104 99 101 102  --   --  99  ?CO2 '23 28 22 '$ 20*  --   --  26  ?GLUCOSE 82 80 92 111*  --   --  138*  ?BUN 17 11 26* 42*  --   --  21  ?CREATININE 4.23* 2.95* 5.09* 7.99*  --   --  4.33*  ?CALCIUM 9.2 9.4 9.1 8.7*  --   --  9.2  ?MG  --   --   --   --  1.8 1.5* 1.9  ?PHOS  --   --   --  4.6  --  1.8* 2.8  ? ?GFR: ?Estimated Creatinine Clearance: 8.4 mL/min (A) (by C-G formula based on SCr of 4.33 mg/dL (H)). ?Liver Function Tests: ?Recent Labs  ?Lab 11/15/21 ?1305  ?ALBUMIN 2.3*  ? ?No results for input(s): LIPASE, AMYLASE in the last 168 hours. ?No results for input(s): AMMONIA in the last 168 hours. ? ?Coagulation Profile: ?No results for input(s): INR, PROTIME in the last 168 hours. ? ?Cardiac Enzymes: ?No results for input(s): CKTOTAL, CKMB, CKMBINDEX, TROPONINI in the last 168 hours. ?BNP (last 3 results) ?No results for input(s): PROBNP in the last 8760 hours. ?HbA1C: ?No results for input(s): HGBA1C in the last 72 hours. ?CBG: ?Recent Labs  ?Lab 11/15/21 ?1211 11/15/21 ?1938 11/15/21 ?2342 11/16/21 ?0355 11/16/21 ?0756  ?GLUCAP 119* 94 163* 130* 168*  ? ?Lipid Profile: ?No results for input(s): CHOL, HDL, LDLCALC, TRIG, CHOLHDL, LDLDIRECT in the last 72 hours. ?Thyroid Function Tests: ?No results for input(s): TSH, T4TOTAL, FREET4, T3FREE,  THYROIDAB in the last 72 hours. ?Anemia Panel: ?No results for input(s): VITAMINB12, FOLATE, FERRITIN, TIBC, IRON, RETICCTPCT in the last 72 hours. ?Sepsis Labs: ?No results for input(s): PROCALCITON, LATICACIDVEN in

## 2021-11-17 DIAGNOSIS — Z992 Dependence on renal dialysis: Secondary | ICD-10-CM | POA: Diagnosis not present

## 2021-11-17 DIAGNOSIS — I5022 Chronic systolic (congestive) heart failure: Secondary | ICD-10-CM | POA: Diagnosis not present

## 2021-11-17 DIAGNOSIS — H6092 Unspecified otitis externa, left ear: Secondary | ICD-10-CM | POA: Diagnosis not present

## 2021-11-17 DIAGNOSIS — N186 End stage renal disease: Secondary | ICD-10-CM | POA: Diagnosis not present

## 2021-11-17 LAB — RENAL FUNCTION PANEL
Albumin: 2.1 g/dL — ABNORMAL LOW (ref 3.5–5.0)
Anion gap: 11 (ref 5–15)
BUN: 40 mg/dL — ABNORMAL HIGH (ref 8–23)
CO2: 27 mmol/L (ref 22–32)
Calcium: 8.9 mg/dL (ref 8.9–10.3)
Chloride: 101 mmol/L (ref 98–111)
Creatinine, Ser: 5.98 mg/dL — ABNORMAL HIGH (ref 0.44–1.00)
GFR, Estimated: 7 mL/min — ABNORMAL LOW (ref >60)
Glucose, Bld: 134 mg/dL — ABNORMAL HIGH (ref 70–99)
Phosphorus: 3.5 mg/dL (ref 2.5–4.6)
Potassium: 3.8 mmol/L (ref 3.5–5.1)
Sodium: 139 mmol/L (ref 135–145)

## 2021-11-17 LAB — GLUCOSE, CAPILLARY
Glucose-Capillary: 114 mg/dL — ABNORMAL HIGH (ref 70–99)
Glucose-Capillary: 149 mg/dL — ABNORMAL HIGH (ref 70–99)
Glucose-Capillary: 147 mg/dL — ABNORMAL HIGH (ref 70–99)
Glucose-Capillary: 123 mg/dL — ABNORMAL HIGH (ref 70–99)
Glucose-Capillary: 108 mg/dL — ABNORMAL HIGH (ref 70–99)

## 2021-11-17 LAB — CBC
HCT: 28.8 % — ABNORMAL LOW (ref 36.0–46.0)
Hemoglobin: 9.3 g/dL — ABNORMAL LOW (ref 12.0–15.0)
MCH: 32.6 pg (ref 26.0–34.0)
MCHC: 32.3 g/dL (ref 30.0–36.0)
MCV: 101.1 fL — ABNORMAL HIGH (ref 80.0–100.0)
Platelets: UNDETERMINED 10*3/uL (ref 150–400)
RBC: 2.85 MIL/uL — ABNORMAL LOW (ref 3.87–5.11)
RDW: 20.5 % — ABNORMAL HIGH (ref 11.5–15.5)
WBC: 6.6 10*3/uL (ref 4.0–10.5)
nRBC: 0 % (ref 0.0–0.2)

## 2021-11-17 MED ORDER — DARBEPOETIN ALFA 100 MCG/0.5ML IJ SOSY
100.0000 ug | PREFILLED_SYRINGE | INTRAMUSCULAR | Status: DC
  Administered 2021-11-17: 100 ug via INTRAVENOUS

## 2021-11-17 MED ORDER — DARBEPOETIN ALFA 100 MCG/0.5ML IJ SOSY
PREFILLED_SYRINGE | INTRAMUSCULAR | Status: AC
  Filled 2021-11-17: qty 0.5

## 2021-11-17 NOTE — Progress Notes (Signed)
PT Cancellation Note ? ?Patient Details ?Name: Shawna [name] ?MRN: [id number] ?DOB: 13-Dec-1940 ? ? ?Cancelled Treatment:    Reason Eval/Treat Not Completed: Patient at procedure or test/unavailable ? ?Attempted to see twice today and both times pt being cleaned by nursing due to diarrhea/bowel incontinence.  ? ? ?Arby Barrette, PT ?Acute Rehabilitation Services  ?Pager [phone] ?Office [phone] ? ?Jeanie Cooks Ho Parisi ?11/17/2021, 3:12 PM ?

## 2021-11-17 NOTE — Progress Notes (Signed)
?[facility] ?Progress Note  ? ?Subjective:  Seen on HD - 1L UFG and tolerating. Grunting to my questions, not speaking. Looks comfortable. ? ?Objective ?Vitals:  ? 11/17/21 0712 11/17/21 0722 11/17/21 0730 11/17/21 0800  ?BP: 135/60 (!) 122/47 (!) 151/59 (!) 114/44  ?Pulse: (!) 50 (!) 50 (!) 54 (!) 50  ?Resp: (!) 25 (!) 21 (!) 22 20  ?Temp: 97.7 ?F (36.5 ?C)     ?TempSrc:      ?SpO2:      ?Weight: 57.4 kg     ?Height:      ? ?Physical Exam ?General: Frail woman, NAD. NG tube in place. ?Heart: RRR; no murmur ?Lungs: CTAB; no rales ?Abdomen: soft ?Extremities: No LE edema ?Dialysis Access: LUE AVF + bruit ? ?Additional Objective ?Labs: ?Basic Metabolic Panel: ?Recent Labs  ?Lab 11/15/21 ?1305 11/15/21 ?1704 11/16/21 ?0618 11/16/21 ?1637 11/17/21 ?9381  ?NA 141  --  137  --  139  ?K 3.8  --  3.7  --  3.8  ?CL 102  --  99  --  101  ?CO2 20*  --  26  --  27  ?GLUCOSE 111*  --  138*  --  134*  ?BUN 42*  --  21  --  40*  ?CREATININE 7.99*  --  4.33*  --  5.98*  ?CALCIUM 8.7*  --  9.2  --  8.9  ?PHOS 4.6   < > 2.8 3.1 3.5  ? < > = values in this interval not displayed.  ? ?Liver Function Tests: ?Recent Labs  ?Lab 11/15/21 ?1305 11/17/21 ?0612  ?ALBUMIN 2.3* 2.1*  ? ?CBC: ?Recent Labs  ?Lab 11/12/21 ?0175 11/13/21 ?1025 11/14/21 ?0017 11/15/21 ?1306 11/16/21 ?0618  ?WBC 7.3 7.9 7.7 9.4 6.1  ?HGB 10.4* 10.0* 10.1* 9.1* 9.9*  ?HCT 31.2* 31.7* 32.2* 28.3* 31.2*  ?MCV 98.4 100.3* 102.2* 101.4* 101.0*  ?PLT PLATELET CLUMPS NOTED ON SMEAR, UNABLE TO ESTIMATE PLATELET CLUMPS NOTED ON SMEAR, UNABLE TO ESTIMATE 187 248 PLATELET CLUMPS NOTED ON SMEAR, UNABLE TO ESTIMATE  ? ?Blood Culture ?   ?Component Value Date/Time  ? SDES EAR 11/09/2021 1530  ? SPECREQUEST  11/09/2021 1530  ?  LEFT EAR ?Performed at [facility], [place] [address] [place], [place] [address] ?  ? CULT RARE PSEUDOMONAS AERUGINOSA 11/09/2021 1530  ? REPTSTATUS 11/11/2021 FINAL 11/09/2021 1530  ? ?Studies/Results: ?DG Abd Portable 1V ? ?Result  Date: 11/15/2021 ?CLINICAL DATA:  Feeding tube placement. EXAM: PORTABLE ABDOMEN - 1 VIEW COMPARISON:  07/14/2017 FINDINGS: The tip of the feeding tube is in the distal stomach, near the pylorus and directed distally towards the duodenum. Visualized bowel gas in the upper abdomen is unremarkable. IMPRESSION: Feeding tube tip is at the pylorus directed towards the duodenum. Electronically Signed   By: Shawna [name] M.D.   On: 11/15/2021 11:08   ? ?Medications: ? sodium chloride    ? sodium chloride    ? sodium chloride Stopped (11/12/21 1719)  ? sodium chloride    ? sodium chloride    ? diphenhydrAMINE 12.5 mg (11/14/21 2053)  ? feeding supplement (OSMOLITE 1.5 CAL) 45 mL/hr at 11/17/21 0600  ? piperacillin-tazobactam (ZOSYN)  IV 2.25 g (11/17/21 0207)  ? ? amiodarone  400 mg Per Tube BID  ? Chlorhexidine Gluconate Cloth  6 each Topical Q0600  ? Chlorhexidine Gluconate Cloth  6 each Topical Q0600  ? cyanocobalamin  1,000 mcg Intramuscular Q0600  ? Followed by  ? [START ON  11/21/2021] cyanocobalamin  1,000 mcg Intramuscular Weekly  ? feeding supplement (PROSource TF)  45 mL Per Tube BID  ? heparin injection (subcutaneous)  5,000 Units Subcutaneous Q8H  ? levothyroxine  25 mcg Per Tube Q0600  ? LORazepam  0.5 mg Intravenous QHS  ? metoprolol tartrate  25 mg Per Tube BID  ? midodrine  5-10 mg Per Tube Q M,W,F-HD  ? multivitamin  1 tablet Per Tube Daily  ? rosuvastatin  10 mg Per Tube Daily  ? senna-docusate  1 tablet Per Tube BID  ? thiamine  100 mg Per NG tube Daily  ? tobramycin-dexamethasone  2 drop Left EAR Q6H  ? ? ?Dialysis Orders: ?[facility] MWF ?  3.5h 56.5kg   BFR 300   2/2.5 bath LUA AVF  Heparin none ? - venofer 50 mg q Monday ? - sensipar '30mg'$  PO q HD ? - calcitriol 1.39mg PO q HD ? - mircera 2065m q 2wks, last 4/25 ?- 5/01 lab showed HBsAg neg w/ Abs > 10/ protective ?  ?Assessment/Plan: ?AMS/weakness: multifactorial sepsis + polypharmacy vs cephalosporins. Has good HD compliance. Continue to hold  gabapentin, oxycodone, and xanax.  ?L ear mastoiditis: + pseudomonas L ear cx, finishing course of Zosyn. ?Atrial fib - w/ RVR here. Started on IV amio + metoprolol this admit. ?ESRD: Continue HD per usual MWF schedule - HD today. ?Hypokalemia - resolved, K 3.8, using high K dialysate. ?BP/volume: BP stable, no edema. Uses midodrine with HD.  ?Anemia of ESRD: Hgb 9.9 -> resuming Aranesp with HD today.  Transfused 2U PRBCs on 11/10/21.   ?Metabolic Bone Disease: Ca ok, Phos low - binder has been on hold.  ?CAD (Hx CABG): on statin ?Hx of colon and ovarian cancer ? ?KaVeneta PentonPA-C ?11/17/2021, 8:32 AM  ?[facility] ? ? ? ?

## 2021-11-17 NOTE — Progress Notes (Signed)
?      ?                 PROGRESS NOTE ? ?      ?PATIENT DETAILS ?Name: Shawna [name] ?Age: 81 y.o. ?Sex: female ?Date of Birth: 1940/12/11 ?Admit Date: 11/08/2021 ?Admitting Physician Little Ishikawa, MD ?GUR:[id number] ? ?Brief Summary: ?81 year old with PAF, CAD s/p CABG/PCI, ESRD on HD MWF-who had a known left ear infection-followed by ENT-was on topical antimicrobial eardrops (cultures positive for Pseudomonas)-presented with worsening encephalopathy.  Patient was subsequently admitted to the hospitalist service for further evaluation and treatment.  See below for further details. ? ?Significant events: ?5/1>> admit for evaluation of altered mental status. ? ?Significant studies: ?4/21>> CT temporal bone: Complete opacification of the left external auditory canal/left middle ear cavity, small focus of osseous erosion at the left mastoid air cells.  Findings favored to reflect acute posterior mastoiditis and external otitis. ?5/02>> MRI brain: No acute findings. ?5/6-5/7>> LTM EEG: No seizures. ? ?Significant microbiology data: ?5/1>> blood culture: Negative ?5/2>> left ear discharge culture: Pseudomonas ? ?Procedures: ?5/08>> cortrak examined ? ?Consults: ?ENT, nephrology, neurology, cardiology ? ?Subjective: ?Seen earlier at hemodialysis-Sleepy but awakes-follows simple commands.  Able to whisper her name to me this morning. ? ?Objective: ?Vitals: ?Blood pressure (!) 143/53, pulse 61, temperature 97.7 ?F (36.5 ?C), resp. rate 17, height '5\' 1"'$  (1.549 m), weight 57.4 kg, SpO2 97 %.  ? ?Exam: ?Gen Exam:not in any distress ?HEENT:atraumatic, normocephalic ?Chest: B/L clear to auscultation anteriorly ?CVS:S1S2 regular ?Abdomen:soft non tender, non distended ?Extremities:no edema ?Neurology: Non focal ?Skin: no rash ? ?Pertinent Labs/Radiology: ? ?  Latest Ref Rng & Units 11/17/2021  ?  6:12 AM 11/16/2021  ?  6:18 AM 11/15/2021  ?  1:06 PM  ?CBC  ?WBC 4.0 - 10.5 K/uL 6.6   6.1   9.4    ?Hemoglobin 12.0 -  15.0 g/dL 9.3   9.9   9.1    ?Hematocrit 36.0 - 46.0 % 28.8   31.2   28.3    ?Platelets 150 - 400 K/uL PLATELET CLUMPS NOTED ON SMEAR, UNABLE TO ESTIMATE   PLATELET CLUMPS NOTED ON SMEAR, UNABLE TO ESTIMATE   248    ?  ?Lab Results  ?Component Value Date  ? NA 139 11/17/2021  ? K 3.8 11/17/2021  ? CL 101 11/17/2021  ? CO2 27 11/17/2021  ?  ? ? ?Assessment/Plan: ?Acute metabolic encephalopathy: Felt to be multifactorial-due to malignant otitis externa/mastoiditis, cefepime induced neurotoxicity, possible side effect of gabapentin/Xanax/narcotics.  Neuroimaging negative for acute abnormalities-EEG negative for seizures-slowly improving with supportive care.  No longer on cefepime.  Continue supportive care-remains with NG tube feeding-as she is still not yet awake enough to tolerate oral intake. ? ?Malignant otitis externa due to Pseudomonas: Continue IV Zosyn/TobraDex eyedrops.  Recommendations are for IV antibiotics x3 weeks. ? ?ESRD on HD MWF: Nephrology following. ? ?Normocytic anemia: S/p PRBC transfusion on 5/3-hemoglobin stable.  Continue Aranesp ? ?PAF with RVR: Maintaining sinus rhythm-on amiodarone-not on anticoagulation due to GI bleeding.  Recommendations are for amiodarone 400 mg twice daily for 2 weeks, then decrease to 200 mg daily. ? ?CAD s/p CABG: No anginal symptoms-continue statin/beta-blocker. ? ?Chronic HFrEF (EF 30-35% on 07/16/2021): Volume status stable-volume removal with HD. ? ?HTN: BP stable-continue metoprolol. ? ?History of colon cancer/ovarian cancer: Completed previous therapy-follow-up with oncology in the outpatient setting. ? ?Nutrition Status: ?Nutrition Problem: Moderate Malnutrition ?Etiology: acute illness, chronic illness (acute enncephalopathy with  refusal to take po, ESRD/HD) ?Signs/Symptoms: mild fat depletion, moderate fat depletion ?Interventions: Refer to RD note for recommendations, Tube feeding, MVI ? ?BMI: ?Estimated body mass index is 23.91 kg/m? as calculated from the  following: ?  Height as of this encounter: '5\' 1"'$  (1.549 m). ?  Weight as of this encounter: 57.4 kg.  ? ?Code status: ?  Code Status: Full Code  ? ?DVT Prophylaxis: ?heparin injection 5,000 Units Start: 11/11/21 1400 ?Place and maintain sequential compression device Start: 11/09/21 0355 ?  ?Family Communication: Spouse-Nathaniel [phone]-updated on 5/10 ? ? ?Disposition Plan: ?Status is: Inpatient ?Remains inpatient appropriate because: Resolving encephalopathy-NG tube still in place-not yet stable for discharge.  Not yet at baseline. ?  ?Planned Discharge Destination:Skilled nursing facility ? ? ?Diet: ?Diet Order   ? ?       ?  Diet regular Room service appropriate? Yes; Fluid consistency: Thin  Diet effective now       ?  ? ?  ?  ? ?  ?  ? ? ?Antimicrobial agents: ?Anti-infectives (From admission, onward)  ? ? Start     Dose/Rate Route Frequency Ordered Stop  ? 11/15/21 1200  ceFEPIme (MAXIPIME) 2 g in sodium chloride 0.9 % 100 mL IVPB  Status:  Discontinued       ? 2 g ?200 mL/hr over 30 Minutes Intravenous Every M-W-F (Hemodialysis) 11/13/21 1328 11/14/21 0717  ? 11/15/21 1200  cefTAZidime (FORTAZ) 2 g in sodium chloride 0.9 % 100 mL IVPB  Status:  Discontinued       ? 2 g ?200 mL/hr over 30 Minutes Intravenous Every M-W-F (Hemodialysis) 11/14/21 1252 11/14/21 1613  ? 11/14/21 1900  cefTAZidime (FORTAZ) 1 g in sodium chloride 0.9 % 100 mL IVPB  Status:  Discontinued       ? 1 g ?200 mL/hr over 30 Minutes Intravenous  Once 11/14/21 1252 11/14/21 1613  ? 11/14/21 1800  piperacillin-tazobactam (ZOSYN) IVPB 2.25 g       ? 2.25 g ?100 mL/hr over 30 Minutes Intravenous Every 8 hours 11/14/21 1639 12/02/21 0159  ? 11/11/21 1800  ceFEPIme (MAXIPIME) 1 g in sodium chloride 0.9 % 100 mL IVPB  Status:  Discontinued       ? 1 g ?200 mL/hr over 30 Minutes Intravenous Daily 11/10/21 0919 11/14/21 0717  ? 11/10/21 1800  ceFEPIme (MAXIPIME) 500 mg in dextrose 5 % 50 mL IVPB       ? 500 mg ?110 mL/hr over 30 Minutes  Intravenous  Once 11/10/21 9528 11/10/21 1752  ? 11/10/21 1600  ceFEPIme (MAXIPIME) 1 g in sodium chloride 0.9 % 100 mL IVPB  Status:  Discontinued       ? 1 g ?200 mL/hr over 30 Minutes Intravenous Daily 11/10/21 0911 11/10/21 0919  ? 11/10/21 1015  ceFEPIme (MAXIPIME) 1 g in sodium chloride 0.9 % 100 mL IVPB       ? 1 g ?200 mL/hr over 30 Minutes Intravenous  Once 11/10/21 0916 11/10/21 1052  ? 11/09/21 1800  cefTRIAXone (ROCEPHIN) 2 g in sodium chloride 0.9 % 100 mL IVPB  Status:  Discontinued       ? 2 g ?200 mL/hr over 30 Minutes Intravenous Every 24 hours 11/08/21 2151 11/10/21 0905  ? 11/08/21 2000  cefTRIAXone (ROCEPHIN) 2 g in sodium chloride 0.9 % 100 mL IVPB       ? 2 g ?200 mL/hr over 30 Minutes Intravenous  Once 11/08/21 1947 11/08/21 2121  ? ?  ? ? ? ?  MEDICATIONS: ?Scheduled Meds: ? Darbepoetin Alfa      ? amiodarone  400 mg Per Tube BID  ? Chlorhexidine Gluconate Cloth  6 each Topical Q0600  ? Chlorhexidine Gluconate Cloth  6 each Topical Q0600  ? cyanocobalamin  1,000 mcg Intramuscular Q0600  ? Followed by  ? [START ON 11/21/2021] cyanocobalamin  1,000 mcg Intramuscular Weekly  ? darbepoetin (ARANESP) injection - DIALYSIS  100 mcg Intravenous Q Wed-HD  ? feeding supplement (PROSource TF)  45 mL Per Tube BID  ? heparin injection (subcutaneous)  5,000 Units Subcutaneous Q8H  ? levothyroxine  25 mcg Per Tube Q0600  ? LORazepam  0.5 mg Intravenous QHS  ? metoprolol tartrate  25 mg Per Tube BID  ? midodrine  5-10 mg Per Tube Q M,W,F-HD  ? multivitamin  1 tablet Per Tube Daily  ? rosuvastatin  10 mg Per Tube Daily  ? senna-docusate  1 tablet Per Tube BID  ? thiamine  100 mg Per NG tube Daily  ? tobramycin-dexamethasone  2 drop Left EAR Q6H  ? ?Continuous Infusions: ? sodium chloride    ? sodium chloride    ? sodium chloride Stopped (11/12/21 1719)  ? sodium chloride    ? sodium chloride    ? diphenhydrAMINE 12.5 mg (11/14/21 2053)  ? feeding supplement (OSMOLITE 1.5 CAL) 45 mL/hr at 11/17/21 0600  ?  piperacillin-tazobactam (ZOSYN)  IV 2.25 g (11/17/21 0207)  ? ?PRN Meds:.sodium chloride, sodium chloride, sodium chloride, sodium chloride, sodium chloride, alteplase, diphenhydrAMINE, heparin, hydrOXYzine,

## 2021-11-17 NOTE — Progress Notes (Signed)
removed 574ms net fluid unable to remove more due to hypotension.  pre bp 135/60 post bp 118/58 pre weight 57.4 post weight 56.5kg. bed scales.  2 bandages to lua avf no bleeding dressing cdi.  gave 1500 units of heparin as ordered.  and 1065m aranesp. ?

## 2021-11-18 DIAGNOSIS — I5022 Chronic systolic (congestive) heart failure: Secondary | ICD-10-CM | POA: Diagnosis not present

## 2021-11-18 DIAGNOSIS — G9340 Encephalopathy, unspecified: Secondary | ICD-10-CM | POA: Diagnosis not present

## 2021-11-18 DIAGNOSIS — Z85038 Personal history of other malignant neoplasm of large intestine: Secondary | ICD-10-CM | POA: Diagnosis not present

## 2021-11-18 DIAGNOSIS — I10 Essential (primary) hypertension: Secondary | ICD-10-CM | POA: Diagnosis not present

## 2021-11-18 LAB — RENAL FUNCTION PANEL
Albumin: 2.4 g/dL — ABNORMAL LOW (ref 3.5–5.0)
Anion gap: 10 (ref 5–15)
BUN: 28 mg/dL — ABNORMAL HIGH (ref 8–23)
CO2: 28 mmol/L (ref 22–32)
Calcium: 9.1 mg/dL (ref 8.9–10.3)
Chloride: 99 mmol/L (ref 98–111)
Creatinine, Ser: 4.04 mg/dL — ABNORMAL HIGH (ref 0.44–1.00)
GFR, Estimated: 11 mL/min — ABNORMAL LOW (ref >60)
Glucose, Bld: 150 mg/dL — ABNORMAL HIGH (ref 70–99)
Phosphorus: 2.7 mg/dL (ref 2.5–4.6)
Potassium: 4.3 mmol/L (ref 3.5–5.1)
Sodium: 137 mmol/L (ref 135–145)

## 2021-11-18 LAB — CBC
HCT: 32.5 % — ABNORMAL LOW (ref 36.0–46.0)
Hemoglobin: 10.3 g/dL — ABNORMAL LOW (ref 12.0–15.0)
MCH: 32.3 pg (ref 26.0–34.0)
MCHC: 31.7 g/dL (ref 30.0–36.0)
MCV: 101.9 fL — ABNORMAL HIGH (ref 80.0–100.0)
Platelets: UNDETERMINED 10*3/uL (ref 150–400)
RBC: 3.19 MIL/uL — ABNORMAL LOW (ref 3.87–5.11)
RDW: 20.7 % — ABNORMAL HIGH (ref 11.5–15.5)
WBC: 6.7 10*3/uL (ref 4.0–10.5)
nRBC: 0 % (ref 0.0–0.2)

## 2021-11-18 LAB — GLUCOSE, CAPILLARY
Glucose-Capillary: 110 mg/dL — ABNORMAL HIGH (ref 70–99)
Glucose-Capillary: 115 mg/dL — ABNORMAL HIGH (ref 70–99)
Glucose-Capillary: 135 mg/dL — ABNORMAL HIGH (ref 70–99)
Glucose-Capillary: 151 mg/dL — ABNORMAL HIGH (ref 70–99)
Glucose-Capillary: 152 mg/dL — ABNORMAL HIGH (ref 70–99)

## 2021-11-18 LAB — MAGNESIUM: Magnesium: 1.8 mg/dL (ref 1.7–2.4)

## 2021-11-18 LAB — ZINC: Zinc: 69 ug/dL (ref 44–115)

## 2021-11-18 LAB — VITAMIN B6: Vitamin B6: 2.6 ug/L — ABNORMAL LOW (ref 3.4–65.2)

## 2021-11-18 LAB — COPPER, SERUM: Copper: 124 ug/dL (ref 80–158)

## 2021-11-18 MED ORDER — CHLORHEXIDINE GLUCONATE CLOTH 2 % EX PADS
6.0000 | MEDICATED_PAD | Freq: Every day | CUTANEOUS | Status: DC
  Administered 2021-11-18: 6 via TOPICAL

## 2021-11-18 MED ORDER — MAGNESIUM SULFATE 2 GM/50ML IV SOLN
2.0000 g | Freq: Once | INTRAVENOUS | Status: AC
  Administered 2021-11-18: 2 g via INTRAVENOUS
  Filled 2021-11-18: qty 50

## 2021-11-18 MED ORDER — GUAIFENESIN-DM 100-10 MG/5ML PO SYRP
5.0000 mL | ORAL_SOLUTION | ORAL | Status: DC | PRN
  Administered 2021-11-18: 5 mL via ORAL
  Filled 2021-11-18: qty 5

## 2021-11-18 MED ORDER — ALBUTEROL SULFATE (2.5 MG/3ML) 0.083% IN NEBU
2.5000 mg | INHALATION_SOLUTION | Freq: Once | RESPIRATORY_TRACT | Status: AC | PRN
  Administered 2021-11-18: 2.5 mg via RESPIRATORY_TRACT
  Filled 2021-11-18: qty 3

## 2021-11-18 MED ORDER — HYDRALAZINE HCL 20 MG/ML IJ SOLN
5.0000 mg | INTRAMUSCULAR | Status: DC | PRN
  Administered 2021-11-18: 5 mg via INTRAVENOUS
  Filled 2021-11-18 (×2): qty 1

## 2021-11-18 MED ORDER — AMIODARONE HCL 200 MG PO TABS
400.0000 mg | ORAL_TABLET | Freq: Two times a day (BID) | ORAL | Status: DC
  Administered 2021-11-18 – 2021-11-19 (×3): 400 mg
  Filled 2021-11-18 (×4): qty 2

## 2021-11-18 MED ORDER — CINACALCET HCL 30 MG PO TABS
30.0000 mg | ORAL_TABLET | ORAL | Status: DC
  Administered 2021-11-19 – 2021-11-24 (×3): 30 mg via ORAL
  Filled 2021-11-18 (×3): qty 1

## 2021-11-18 MED ORDER — AMIODARONE HCL 200 MG PO TABS: 200.0000 mg | ORAL_TABLET | Freq: Every day | ORAL | Status: DC

## 2021-11-18 NOTE — Progress Notes (Signed)
Physical Therapy Treatment ?Patient Details ?Name: Shawna [name] ?MRN: [id number] ?DOB: August 25, 1940 ?Today's Date: 11/18/2021 ? ? ?History of Present Illness Pt is an 81 year old woman admitted on 11/09/21 with AMS and fever with symptoms of mastoiditis and otitis media. Brain MRI negative for acute changes Hospital course complicated by anemia, requiring transfusion.  EEG shows encephalopathy.   PMH: CHF, ESRD, GERD, HLD, HTN, chronic anemia, PAF, CAD s/p CABG. ? ?  ?PT Comments  ? ? Once pt aroused she stated "Good morning" and later "stop that" (when cleaning face with washcloth and she withdrew from cloth). Kept eyes closed 95% of session, however was following commands intermittently (~60% of attempts) with LE exercises (supine). Patient again with diarrhea and unable to attempt sitting at side of bed. Nursing made aware.  ?   ?Recommendations for follow up therapy are one component of a multi-disciplinary discharge planning process, led by the attending physician.  Recommendations may be updated based on patient status, additional functional criteria and insurance authorization. ? ?Follow Up Recommendations ? Skilled nursing-short term rehab (<3 hours/day) ?  ?  ?Assistance Recommended at Discharge Frequent or constant Supervision/Assistance  ?Patient can return home with the following Two people to help with walking and/or transfers;Two people to help with bathing/dressing/bathroom;Assistance with cooking/housework;Assistance with feeding;Direct supervision/assist for medications management;Direct supervision/assist for financial management;Assist for transportation;Help with stairs or ramp for entrance ?  ?Equipment Recommendations ? None recommended by PT  ?  ?Recommendations for Other Services   ? ? ?  ?Precautions / Restrictions Precautions ?Precautions: Fall ?Precaution Comments: feeding tube, currently needs mitts on secondary to confusion ?Restrictions ?Weight Bearing Restrictions: No  ?  ? ?Mobility ? Bed  Mobility ?Overal bed mobility: Needs Assistance ?Bed Mobility: Rolling ?Rolling: Total assist ?  ?  ?  ?  ?General bed mobility comments: attempted to arouse pt with fair results; washcloth to clean face more effective ?  ? ?Transfers ?  ?  ?  ?  ?  ?  ?  ?  ?  ?General transfer comment: deferred due to incontinence of bowels and needed to be cleaned ?  ? ?Ambulation/Gait ?  ?  ?  ?  ?  ?  ?  ?  ? ? ?Stairs ?  ?  ?  ?  ?  ? ? ?Wheelchair Mobility ?  ? ?Modified Rankin (Stroke Patients Only) ?  ? ? ?  ?Balance   ?  ?  ?  ?  ?  ?  ?  ?  ?  ?  ?  ?  ?  ?  ?  ?  ?  ?  ?  ? ?  ?Cognition Arousal/Alertness: Lethargic ?Behavior During Therapy: Flat affect ?Overall Cognitive Status: Difficult to assess ?  ?  ?  ?  ?  ?  ?  ?  ?  ?  ?  ?  ?Following Commands: Follows one step commands with increased time (and multi-modal cues) ?  ?  ?  ?  ?  ?  ? ?  ?Exercises General Exercises - Lower Extremity ?Ankle Circles/Pumps: PROM, Both, 10 reps (followed by 30 sec heel cord stretch to achieve neutral DF) ?Heel Slides: AAROM, Both, 5 reps (pt assisting with knee flexion and extension ~50 % of time) ?Hip ABduction/ADduction: AAROM, Both, 10 reps, Supine (assisting with adduction ~70% of reps) ? ?  ?General Comments   ?  ?  ? ?Pertinent Vitals/Pain Pain Assessment ?Pain Assessment: Faces ?Faces Pain Scale: No  hurt ?Breathing: normal ?Negative Vocalization: none ?Facial Expression: smiling or inexpressive ?Body Language: relaxed ?Consolability: no need to console ?PAINAD Score: 0  ? ? ?Home Living Family/patient expects to be discharged to:: Private residence ?Living Arrangements: Spouse/significant other ?Available Help at Discharge: Family;Available 24 hours/day ?Type of Home: House ?Home Access: Stairs to enter ?Entrance Stairs-Rails: Right ?Entrance Stairs-Number of Steps: 2 ?  ?Home Layout: One level ?Home Equipment: Rollator (4 wheels);Cane - single point ?Additional Comments: Information from previous admission as unsure of  accuracy from pt.  ?  ?Prior Function    ?  ?  ?   ? ?PT Goals (current goals can now be found in the care plan section) Acute Rehab PT Goals ?Patient Stated Goal: unable to state ?Time For Goal Achievement: 11/25/21 ?Potential to Achieve Goals: Fair ?Progress towards PT goals: Not progressing toward goals - comment (limited by diarrhea and decr arousal) ? ?  ?Frequency ? ? ? Min 2X/week ? ? ? ?  ?PT Plan Current plan remains appropriate  ? ? ?Co-evaluation   ?  ?  ?  ?  ? ?  ?AM-PAC PT "6 Clicks" Mobility   ?Outcome Measure ? Help needed turning from your back to your side while in a flat bed without using bedrails?: Total ?Help needed moving from lying on your back to sitting on the side of a flat bed without using bedrails?: Total ?Help needed moving to and from a bed to a chair (including a wheelchair)?: Total ?Help needed standing up from a chair using your arms (e.g., wheelchair or bedside chair)?: Total ?Help needed to walk in hospital room?: Total ?Help needed climbing 3-5 steps with a railing? : Total ?6 Click Score: 6 ? ?  ?End of Session   ?Activity Tolerance: Patient limited by lethargy ?Patient left: in bed;with call bell/phone within reach;with bed alarm set ?Nurse Communication: Other (comment) (again needs to be cleaned (had been cleaned just prior to PT session)) ?PT Visit Diagnosis: Other abnormalities of gait and mobility (R26.89);Muscle weakness (generalized) (M62.81) ?  ? ? ?Time: 8309-4076 ?PT Time Calculation (min) (ACUTE ONLY): 12 min ? ?Charges:  $Therapeutic Exercise: 8-22 mins          ?          ? ? ?Arby Barrette, PT ?Acute Rehabilitation Services  ?Pager [phone] ?Office [phone] ? ? ? ?Jeanie Cooks Chanah Tidmore ?11/18/2021, 10:16 AM ? ?

## 2021-11-18 NOTE — Plan of Care (Signed)
°  Problem: Education: °Goal: Knowledge of General Education information will improve °Description: Including pain rating scale, medication(s)/side effects and non-pharmacologic comfort measures °Outcome: Progressing °  °Problem: Health Behavior/Discharge Planning: °Goal: Ability to manage health-related needs will improve °Outcome: Progressing °  °Problem: Clinical Measurements: °Goal: Ability to maintain clinical measurements within normal limits will improve °Outcome: Progressing °Goal: Will remain free from infection °Outcome: Progressing °Goal: Diagnostic test results will improve °Outcome: Progressing °Goal: Respiratory complications will improve °Outcome: Progressing °Goal: Cardiovascular complication will be avoided °Outcome: Progressing °  °Problem: Activity: °Goal: Risk for activity intolerance will decrease °Outcome: Progressing °  °Problem: Nutrition: °Goal: Adequate nutrition will be maintained °Outcome: Progressing °  °Problem: Coping: °Goal: Level of anxiety will decrease °Outcome: Progressing °  °Problem: Elimination: °Goal: Will not experience complications related to bowel motility °Outcome: Progressing °Goal: Will not experience complications related to urinary retention °Outcome: Progressing °  °Problem: Pain Managment: °Goal: General experience of comfort will improve °Outcome: Progressing °  °Problem: Safety: °Goal: Ability to remain free from injury will improve °Outcome: Progressing °  °Problem: Skin Integrity: °Goal: Risk for impaired skin integrity will decrease °Outcome: Progressing °  °Problem: Safety: °Goal: Non-violent Restraint(s) °Outcome: Progressing °  °

## 2021-11-18 NOTE — Progress Notes (Signed)
?      ?                 PROGRESS NOTE ? ?      ?PATIENT DETAILS ?Name: Shawna [name] ?Age: 81 y.o. ?Sex: female ?Date of Birth: 12/21/1940 ?Admit Date: 11/08/2021 ?Admitting Physician Little Ishikawa, MD ?[id number], [facility] ? ?Brief Summary: ?81 year old with PAF, CAD s/p CABG/PCI, ESRD on HD MWF-who had a known left ear infection-followed by ENT-was on topical antimicrobial eardrops (cultures positive for Pseudomonas)-presented with worsening encephalopathy.  Patient was subsequently admitted to the hospitalist service for further evaluation and treatment.  See below for further details. ? ?Significant events: ?5/1>> admit for evaluation of altered mental status. ? ?Significant studies: ?4/21>> CT temporal bone: Complete opacification of the left external auditory canal/left middle ear cavity, small focus of osseous erosion at the left mastoid air cells.  Findings favored to reflect acute posterior mastoiditis and external otitis. ?5/02>> MRI brain: No acute findings. ?5/6-5/7>> LTM EEG: No seizures. ? ?Significant microbiology data: ?5/1>> blood culture: Negative ?5/2>> left ear discharge culture: Pseudomonas ? ?Procedures: ?5/08>> cortrak examined ? ?Consults: ?ENT, nephrology, neurology, cardiology ? ?Subjective: ?Slowly continues to improve-somewhat confused but follows commands. ? ?Objective: ?Vitals: ?Blood pressure (!) 152/67, pulse 66, temperature 99.1 ?F (37.3 ?C), temperature source Oral, resp. rate 16, height '5\' 1"'$  (1.549 m), weight 56.8 kg, SpO2 94 %.  ? ?Exam: ?Gen Exam: awake-not in any distress ?HEENT:atraumatic, normocephalic ?Chest: B/L clear to auscultation anteriorly ?CVS:S1S2 regular ?Abdomen:soft non tender, non distended ?Extremities:no edema ?Neurology: Non focal ?Skin: no rash  ? ?Pertinent Labs/Radiology: ? ?  Latest Ref Rng & Units 11/18/2021  ?  5:00 AM 11/17/2021  ?  6:12 AM 11/16/2021  ?  6:18 AM  ?CBC  ?WBC 4.0 - 10.5 K/uL 6.7   6.6   6.1    ?Hemoglobin 12.0 - 15.0 g/dL 10.3    9.3   9.9    ?Hematocrit 36.0 - 46.0 % 32.5   28.8   31.2    ?Platelets 150 - 400 K/uL PLATELET CLUMPS NOTED ON SMEAR, UNABLE TO ESTIMATE   PLATELET CLUMPS NOTED ON SMEAR, UNABLE TO ESTIMATE   PLATELET CLUMPS NOTED ON SMEAR, UNABLE TO ESTIMATE    ?  ?Lab Results  ?Component Value Date  ? NA 137 11/18/2021  ? K 4.3 11/18/2021  ? CL 99 11/18/2021  ? CO2 28 11/18/2021  ? ?  ?Assessment/Plan: ?Acute metabolic encephalopathy: Felt to be multifactorial-due to malignant otitis externa/mastoiditis, cefepime induced neurotoxicity, possible side effect of gabapentin/Xanax/narcotics.  Neuroimaging negative for acute abnormalities-EEG negative for seizures slowly improving-mental status does wax and wane at times-continue supportive care-no longer on cefepime.  Per family (daughters were at bedside yesterday)-she had a significant amount of her meals yesterday-hence I will go ahead and discontinue her NG tube.. ? ?Malignant otitis externa due to Pseudomonas: Continue IV Zosyn/TobraDex eyedrops.  Recommendations are for IV antibiotics x3 weeks. ? ?ESRD on HD MWF: Nephrology following. ? ?Normocytic anemia: S/p PRBC transfusion on 5/3-hemoglobin stable.  Continue Aranesp ? ?PAF with RVR: Maintaining sinus rhythm-on amiodarone-not on anticoagulation due to GI bleeding.  Recommendations are for amiodarone 400 mg twice daily for 2 weeks, then decrease to 200 mg daily. ? ?CAD s/p CABG: No anginal symptoms-continue statin/beta-blocker. ? ?Chronic HFrEF (EF 30-35% on 07/16/2021): Volume status stable-volume removal with HD. ? ?HTN: BP stable-continue metoprolol. ? ?History of colon cancer/ovarian cancer: Completed previous therapy-follow-up with oncology in the outpatient setting. ? ?Nutrition Status: ?  Nutrition Problem: Moderate Malnutrition ?Etiology: acute illness, chronic illness (acute enncephalopathy with refusal to take po, ESRD/HD) ?Signs/Symptoms: mild fat depletion, moderate fat depletion ?Interventions: Refer to RD note for  recommendations, Tube feeding, MVI ? ?BMI: ?Estimated body mass index is 23.66 kg/m? as calculated from the following: ?  Height as of this encounter: '5\' 1"'$  (1.549 m). ?  Weight as of this encounter: 56.8 kg.  ? ?Code status: ?  Code Status: Full Code  ? ?DVT Prophylaxis: ?heparin injection 5,000 Units Start: 11/11/21 1400 ?Place and maintain sequential compression device Start: 11/09/21 0355 ?  ?Family Communication: Spouse-Nathaniel -[phone] on 5/11 ? ? ?Disposition Plan: ?Status is: Inpatient ?Remains inpatient appropriate because: Resolving encephalopathy-we will attempt to discontinue NG tube today-not yet stable for discharge. ?  ?Planned Discharge Destination:Skilled nursing facility ? ? ?Diet: ?Diet Order   ? ?       ?  Diet regular Room service appropriate? Yes; Fluid consistency: Thin  Diet effective now       ?  ? ?  ?  ? ?  ?  ? ? ?Antimicrobial agents: ?Anti-infectives (From admission, onward)  ? ? Start     Dose/Rate Route Frequency Ordered Stop  ? 11/15/21 1200  ceFEPIme (MAXIPIME) 2 g in sodium chloride 0.9 % 100 mL IVPB  Status:  Discontinued       ? 2 g ?200 mL/hr over 30 Minutes Intravenous Every M-W-F (Hemodialysis) 11/13/21 1328 11/14/21 0717  ? 11/15/21 1200  cefTAZidime (FORTAZ) 2 g in sodium chloride 0.9 % 100 mL IVPB  Status:  Discontinued       ? 2 g ?200 mL/hr over 30 Minutes Intravenous Every M-W-F (Hemodialysis) 11/14/21 1252 11/14/21 1613  ? 11/14/21 1900  cefTAZidime (FORTAZ) 1 g in sodium chloride 0.9 % 100 mL IVPB  Status:  Discontinued       ? 1 g ?200 mL/hr over 30 Minutes Intravenous  Once 11/14/21 1252 11/14/21 1613  ? 11/14/21 1800  piperacillin-tazobactam (ZOSYN) IVPB 2.25 g       ? 2.25 g ?100 mL/hr over 30 Minutes Intravenous Every 8 hours 11/14/21 1639 12/02/21 0359  ? 11/11/21 1800  ceFEPIme (MAXIPIME) 1 g in sodium chloride 0.9 % 100 mL IVPB  Status:  Discontinued       ? 1 g ?200 mL/hr over 30 Minutes Intravenous Daily 11/10/21 0919 11/14/21 0717  ? 11/10/21  1800  ceFEPIme (MAXIPIME) 500 mg in dextrose 5 % 50 mL IVPB       ? 500 mg ?110 mL/hr over 30 Minutes Intravenous  Once 11/10/21 7096 11/10/21 1752  ? 11/10/21 1600  ceFEPIme (MAXIPIME) 1 g in sodium chloride 0.9 % 100 mL IVPB  Status:  Discontinued       ? 1 g ?200 mL/hr over 30 Minutes Intravenous Daily 11/10/21 0911 11/10/21 0919  ? 11/10/21 1015  ceFEPIme (MAXIPIME) 1 g in sodium chloride 0.9 % 100 mL IVPB       ? 1 g ?200 mL/hr over 30 Minutes Intravenous  Once 11/10/21 0916 11/10/21 1052  ? 11/09/21 1800  cefTRIAXone (ROCEPHIN) 2 g in sodium chloride 0.9 % 100 mL IVPB  Status:  Discontinued       ? 2 g ?200 mL/hr over 30 Minutes Intravenous Every 24 hours 11/08/21 2151 11/10/21 0905  ? 11/08/21 2000  cefTRIAXone (ROCEPHIN) 2 g in sodium chloride 0.9 % 100 mL IVPB       ? 2 g ?200 mL/hr over 30 Minutes Intravenous  Once 11/08/21 1947 11/08/21 2121  ? ?  ? ? ? ?MEDICATIONS: ?Scheduled Meds: ? amiodarone  400 mg Per Tube BID  ? Chlorhexidine Gluconate Cloth  6 each Topical Q0600  ? Chlorhexidine Gluconate Cloth  6 each Topical Q0600  ? Chlorhexidine Gluconate Cloth  6 each Topical Q0600  ? [START ON 11/19/2021] cinacalcet  30 mg Oral Q M,W,F-1800  ? cyanocobalamin  1,000 mcg Intramuscular Q0600  ? Followed by  ? [START ON 11/21/2021] cyanocobalamin  1,000 mcg Intramuscular Weekly  ? darbepoetin (ARANESP) injection - DIALYSIS  100 mcg Intravenous Q Wed-HD  ? feeding supplement (PROSource TF)  45 mL Per Tube BID  ? heparin injection (subcutaneous)  5,000 Units Subcutaneous Q8H  ? levothyroxine  25 mcg Per Tube Q0600  ? LORazepam  0.5 mg Intravenous QHS  ? metoprolol tartrate  25 mg Per Tube BID  ? midodrine  5-10 mg Per Tube Q M,W,F-HD  ? multivitamin  1 tablet Per Tube Daily  ? rosuvastatin  10 mg Per Tube Daily  ? senna-docusate  1 tablet Per Tube BID  ? thiamine  100 mg Per NG tube Daily  ? tobramycin-dexamethasone  2 drop Left EAR Q6H  ? ?Continuous Infusions: ? sodium chloride    ? sodium chloride    ? sodium  chloride Stopped (11/12/21 1719)  ? diphenhydrAMINE 12.5 mg (11/14/21 2053)  ? feeding supplement (OSMOLITE 1.5 CAL) Stopped (11/18/21 0957)  ? piperacillin-tazobactam (ZOSYN)  IV 2.25 g (11/18/21 0440)

## 2021-11-18 NOTE — Significant Event (Signed)
Rapid Response Event Note  ? ?Reason for Call :  ?SOB, tachypnea ? ?Initial Focused Assessment:  ?Pt lying in bed with eyes closed, mildly tachpneic. She is alert to self and able to answer some questions. She says she gets SOB at times but isn't right now. She denies CP. Lungs clear and diminished. Skin warm to touch. ? ?T-98, HR-69, BP-194/67, RR-22, SpO2-95% on RA ? ?Interventions:  ?Albuterol tx x 1 ?Hydralazine '5mg'$  IV q4h prn ?Robitussin 22m q4h prn ?Plan of Care:  ?Give meds and monitor results. Continue to monitor pt closely. Call RRT if further assistance needed.  ? ?Event Summary:  ? ?MD Notified: Dr. RMarlowe Sax ?Call Time:2119 ?Arrival TRWCH:[id number]?End Time: 2210 ?HDillard Essex RN ?

## 2021-11-18 NOTE — Progress Notes (Signed)
?[facility] ?Progress Note  ? ?Subjective:   Seen in room, telling RN she doesn't want ear drops because they will hurt. Still confused but much more talkative today. Reports she is "feeling better." ? ?Objective ?Vitals:  ? 11/17/21 1941 11/17/21 2317 11/18/21 0430 11/18/21 0500  ?BP: (!) 135/41 (!) 165/60 (!) 138/40   ?Pulse: 62 61 (!) 54   ?Resp: '15 16 18   '$ ?Temp: 97.6 ?F (36.4 ?C) 98.3 ?F (36.8 ?C) 99.1 ?F (37.3 ?C)   ?TempSrc: Oral Oral Oral   ?SpO2:  94%    ?Weight:    56.8 kg  ?Height:      ? ?Physical Exam ?General: Elderly female, alert and in NAD. NG tube present ?Heart: RRR, no murmurs, rubs or gallops ?Lungs: CTA bilaterally without wheezing, rhonchi or rales ?Abdomen: Soft, non-distended, +BS ?Extremities: No edema b/l lower extremities ?Dialysis Access: LUE AVF + t/b ? ?Additional Objective ?Labs: ?Basic Metabolic Panel: ?Recent Labs  ?Lab 11/16/21 ?0618 11/16/21 ?1637 11/17/21 ?0612 11/18/21 ?0505  ?NA 137  --  139 137  ?K 3.7  --  3.8 4.3  ?CL 99  --  101 99  ?CO2 26  --  27 28  ?GLUCOSE 138*  --  134* 150*  ?BUN 21  --  40* 28*  ?CREATININE 4.33*  --  5.98* 4.04*  ?CALCIUM 9.2  --  8.9 9.1  ?PHOS 2.8 3.1 3.5 2.7  ? ?Liver Function Tests: ?Recent Labs  ?Lab 11/15/21 ?1305 11/17/21 ?0612 11/18/21 ?0505  ?ALBUMIN 2.3* 2.1* 2.4*  ? ?No results for input(s): LIPASE, AMYLASE in the last 168 hours. ?CBC: ?Recent Labs  ?Lab 11/14/21 ?0017 11/15/21 ?1306 11/16/21 ?0618 11/17/21 ?4098 11/18/21 ?0500  ?WBC 7.7 9.4 6.1 6.6 6.7  ?HGB 10.1* 9.1* 9.9* 9.3* 10.3*  ?HCT 32.2* 28.3* 31.2* 28.8* 32.5*  ?MCV 102.2* 101.4* 101.0* 101.1* 101.9*  ?PLT 187 248 PLATELET CLUMPS NOTED ON SMEAR, UNABLE TO ESTIMATE PLATELET CLUMPS NOTED ON SMEAR, UNABLE TO ESTIMATE PLATELET CLUMPS NOTED ON SMEAR, UNABLE TO ESTIMATE  ? ?Blood Culture ?   ?Component Value Date/Time  ? SDES EAR 11/09/2021 1530  ? SPECREQUEST  11/09/2021 1530  ?  LEFT EAR ?Performed at [facility], [place] [address], [place], [place]  [address] ?  ? CULT RARE PSEUDOMONAS AERUGINOSA 11/09/2021 1530  ? REPTSTATUS 11/11/2021 FINAL 11/09/2021 1530  ? ? ?CBG: ?Recent Labs  ?Lab 11/17/21 ?1323 11/17/21 ?1546 11/17/21 ?1949 11/17/21 ?2320 11/18/21 ?0353  ?GLUCAP 149* 147* 123* 108* 151*  ? ? ?Medications: ? sodium chloride    ? sodium chloride    ? sodium chloride Stopped (11/12/21 1719)  ? diphenhydrAMINE 12.5 mg (11/14/21 2053)  ? feeding supplement (OSMOLITE 1.5 CAL) 1,000 mL (11/18/21 0055)  ? piperacillin-tazobactam (ZOSYN)  IV 2.25 g (11/18/21 0440)  ? ? amiodarone  400 mg Per Tube BID  ? Chlorhexidine Gluconate Cloth  6 each Topical Q0600  ? Chlorhexidine Gluconate Cloth  6 each Topical Q0600  ? cyanocobalamin  1,000 mcg Intramuscular Q0600  ? Followed by  ? [START ON 11/21/2021] cyanocobalamin  1,000 mcg Intramuscular Weekly  ? darbepoetin (ARANESP) injection - DIALYSIS  100 mcg Intravenous Q Wed-HD  ? feeding supplement (PROSource TF)  45 mL Per Tube BID  ? heparin injection (subcutaneous)  5,000 Units Subcutaneous Q8H  ? levothyroxine  25 mcg Per Tube Q0600  ? LORazepam  0.5 mg Intravenous QHS  ? metoprolol tartrate  25 mg Per Tube BID  ? midodrine  5-10 mg Per Tube Q M,W,F-HD  ? multivitamin  1 tablet Per Tube Daily  ? rosuvastatin  10 mg Per Tube Daily  ? senna-docusate  1 tablet Per Tube BID  ? thiamine  100 mg Per NG tube Daily  ? tobramycin-dexamethasone  2 drop Left EAR Q6H  ? ? ?Dialysis Orders: ?[facility] MWF ?  3.5h 56.5kg   BFR 300   2/2.5 bath LUA AVF  Heparin none ? - venofer 50 mg q Monday ? - sensipar '30mg'$  PO q HD ? - calcitriol 1.73mg PO q HD ? - mircera 2083m q 2wks, last 4/25 ?- 5/01 lab showed HBsAg neg w/ Abs > 10/ protective ? ?Assessment/Plan: ?AMS/weakness: multifactorial sepsis + polypharmacy vs cephalosporins. Has good HD compliance. Continue to hold gabapentin, oxycodone, and xanax. Seems more alert today ?L ear mastoiditis: + pseudomonas L ear cx, finishing course of Zosyn and antibiotic drops ?Atrial fib - w/ RVR  here. Started on IV amio + metoprolol this admit. ?ESRD: Continue HD per usual MWF schedule. Next HD tomorrow.  ?Hypokalemia - resolved, K 4.3. Using 3K bath with HD ?BP/volume: BP stable, no edema. Uses midodrine with HD.  ?Anemia of ESRD: Hgb 10.3. Aranesp ordered with HD yesterday. Transfused 2U PRBCs on 11/10/21.   ?Metabolic Bone Disease: Calcium running a bit high, not getting sensipar or calcitriol here, will restart sensipar for now and follow calcium level. Phos low - binder has been on hold.  ?CAD (Hx CABG): on statin ?Hx of colon and ovarian cancer ? ?SaAnice PaganiniPA-C ?11/18/2021, 9:13 AM  ?[facility] ?Pager: ([phone] ? ?

## 2021-11-18 NOTE — TOC Progression Note (Signed)
Transition of Care (TOC) - Progression Note  ? ? ?Patient Details  ?Name: Shawna [name] ?MRN: [id number] ?Date of Birth: 02-19-1941 ? ?Transition of Care (TOC) CM/SW Contact  ?[place], LCSW ?Phone Number: ?11/18/2021, 12:04 PM ? ?Clinical Narrative:    ?  ?Spoke with [employer]; she will initiate insurance auth for anticipated DC in next few days.  ? ? ?Expected Discharge Plan: [place] ?Barriers to Discharge: Continued Medical Work up, Ship broker, SNF Pending bed offer ? ?Expected Discharge Plan and Services ?Expected Discharge Plan: [place] ?In-house Referral: Clinical Social Work ?  ?Post Acute Care Choice: Dialysis, Skilled Nursing Facility ?Living arrangements for the past 2 months: [place] ?Expected Discharge Date: 11/11/21               ?  ?  ?  ?  ?  ?  ?  ?  ?  ?  ? ? ?Social Determinants of Health (SDOH) Interventions ?  ? ?Readmission Risk Interventions ? ?  11/12/2021  ?  8:56 AM 10/20/2021  ?  4:38 PM 07/23/2021  ?  4:26 PM  ?Readmission Risk Prevention Plan  ?Transportation Screening Complete Complete Complete  ?Medication Review Press photographer) Complete Complete Complete  ?PCP or Specialist appointment within 3-5 days of discharge Complete Complete Complete  ?[place] or Home Care Consult Complete Complete Complete  ?SW Recovery Care/Counseling Consult Complete Complete Complete  ?Palliative Care Screening Not Applicable Not Applicable Not Applicable  ?Skilled Nursing Facility Complete Not Applicable Not Applicable  ? ? ?

## 2021-11-18 NOTE — Final Progress Note (Addendum)
Rapid response came to see pt in regards to Dyspnea at rest, Spo2 96%, BP 194/67 after being medicated, HR 69, RR 22 and lungs clear. ?Recommended something for cough and to get something to further control BP. ? ?Dr. Marlowe Sax ordered breathing tx.  ? ? ?

## 2021-11-18 NOTE — Progress Notes (Signed)
Shawna [name] is a 81 y.o. female patient. No complaints of pain have been made at this time. NTG has been removed this morning per MD order. Patient consumed 100% of her lunch and dinner trays. Patient is Aox1 to self, pleasantly confused. Glucose monitoring ACHS facilitated. CHG bath complete. IV antibiotics ZOSYN continued this shift and topical antimicrobial eardrops. Q2 turns facilitated and hygenic needs complete. POC  for anticipated patient discharge pending SNF placement for bed offer. ESRD on HD MWF. ? ?1. Altered mental status, unspecified altered mental status type   ?2. Encephalopathy acute   ?3. Otitis externa of left ear, unspecified chronicity, unspecified type   ? ?Past Medical History:  ?Diagnosis Date  ? Acute on chronic respiratory failure with hypoxia ([place]) 10/10/2016  ? Anxiety   ? Arthritis   ? AVM (arteriovenous malformation) of colon   ? CAD (coronary artery disease)   ? a. s/p CABG in 2013 b. DES to D1 in 10/2016. c. cath in 07/2018 showing patent grafts with occlusion of D1 at prior stent site and progression of PDA disease --> medical management recommended  ? Carotid artery disease ([employer])   ? a. 75-64% LICA, 09/3293   ? Chronic anemia   ? Chronic bronchitis ([place])   ? Chronic diastolic CHF (congestive heart failure) ([place])   ? a. 02/2012 Echo EF 60-65%, nl wall motion, Gr 1 DD, mod MR  ? Colon cancer ([place]) 1992  ? Esophageal stricture   ? ESRD on hemodialysis ([place])   ? ESRD due to HTN, started dialysis 2011 and gets HD at [facility] with Dr Hinda Lenis on MWF schedule.  Access is LUA AVF as of Sept 2014.   ? GERD (gastroesophageal reflux disease)   ? High cholesterol 12/2011  ? History of blood transfusion 07/2011; 12/2011; 01/2012 X 2; 04/2012  ? History of gout   ? History of lower GI bleeding   ? Hypertension   ? Iron deficiency anemia   ? Jugular vein occlusion, right ([place])   ? Mitral regurgitation   ? a. Moderate by echo, 02/2012  ? Myocardial infarction [facility])   ? NSVT (nonsustained ventricular  tachycardia) ([place])   ? Ovarian cancer ([place]) 1992  ? PAF (paroxysmal atrial fibrillation) ([place])   ? Pneumonia ~ 2009  ? PUD (peptic ulcer disease)   ? TIA (transient ischemic attack)   ? ?Current Facility-Administered Medications  ?Medication Dose Route Frequency Provider Last Rate Last Admin  ? 0.9 %  sodium chloride infusion  100 mL Intravenous PRN [name], MD      ? 0.9 %  sodium chloride infusion  100 mL Intravenous PRN [name], MD      ? 0.9 %  sodium chloride infusion   Intravenous PRN Elgergawy, Silver Huguenin, MD   Stopped at 11/12/21 1719  ? alteplase (CATHFLO ACTIVASE) injection 2 mg  2 mg Intracatheter Once PRN [name], MD      ? amiodarone (PACERONE) tablet 400 mg  400 mg Per Tube BID Jonetta Osgood, MD      ? Followed by  ? Derrill Memo ON 11/29/2021] amiodarone (PACERONE) tablet 200 mg  200 mg Per Tube Daily Ghimire, Henreitta Leber, MD      ? Chlorhexidine Gluconate Cloth 2 % PADS 6 each  6 each Topical Q0600 [name], MD   6 each at 11/18/21 [phone]  ? [START ON 11/19/2021] cinacalcet (SENSIPAR) tablet 30 mg  30 mg Oral Q M,W,F-1800 Janalee Dane, PA-C      ?  cyanocobalamin ((VITAMIN B-12)) injection 1,000 mcg  1,000 mcg Intramuscular Q0600 Elgergawy, Silver Huguenin, MD   1,000 mcg at 11/18/21 0815  ? Followed by  ? [START ON 11/21/2021] cyanocobalamin ((VITAMIN B-12)) injection 1,000 mcg  1,000 mcg Intramuscular Weekly Elgergawy, Silver Huguenin, MD      ? Darbepoetin Alfa (ARANESP) injection 100 mcg  100 mcg Intravenous Q Wed-HD Loren Racer, PA-C   100 mcg at 11/17/21 0856  ? diphenhydrAMINE (BENADRYL) 12.5 mg in sodium chloride 0.9 % 50 mL IVPB  12.5 mg Intravenous Q6H PRN Elgergawy, Silver Huguenin, MD 100 mL/hr at 11/14/21 2053 12.5 mg at 11/14/21 2053  ? heparin injection 1,000 Units  1,000 Units Dialysis PRN [name], MD      ? heparin injection 5,000 Units  5,000 Units Subcutaneous Q8H Elgergawy, Silver Huguenin, MD   5,000 Units at 11/18/21 9675  ? hydrOXYzine (ATARAX) tablet 25 mg   25 mg Per Tube TID PRN Joselyn Glassman A, RPH   25 mg at 11/17/21 1557  ? levothyroxine (SYNTHROID) tablet 25 mcg  25 mcg Per Tube Q0600 Joselyn Glassman A, RPH   25 mcg at 11/18/21 0538  ? lidocaine (PF) (XYLOCAINE) 1 % injection 5 mL  5 mL Intradermal PRN [name], MD      ? lidocaine-prilocaine (EMLA) cream 1 application.  1 application. Topical PRN [name], MD      ? LORazepam (ATIVAN) injection 0.5 mg  0.5 mg Intravenous QHS Elgergawy, Silver Huguenin, MD   0.5 mg at 11/17/21 2210  ? metoprolol tartrate (LOPRESSOR) tablet 25 mg  25 mg Per Tube BID Elgergawy, Silver Huguenin, MD   25 mg at 11/18/21 0804  ? midodrine (PROAMATINE) tablet 5-10 mg  5-10 mg Per Tube Q M,W,F-HD Levada Dy, Dwayne A, RPH   5 mg at 11/17/21 1213  ? multivitamin (RENA-VIT) tablet 1 tablet  1 tablet Per Tube Daily Joselyn Glassman A, RPH   1 tablet at 11/18/21 0804  ? pentafluoroprop-tetrafluoroeth (GEBAUERS) aerosol 1 application.  1 application. Topical PRN [name], MD      ? piperacillin-tazobactam (ZOSYN) IVPB 2.25 g  2.25 g Intravenous Q8H Skeet Simmer, RPH 100 mL/hr at 11/18/21 1402 2.25 g at 11/18/21 1402  ? rosuvastatin (CRESTOR) tablet 10 mg  10 mg Per Tube Daily Pierce, Dwayne A, RPH   10 mg at 11/18/21 9163  ? senna-docusate (Senokot-S) tablet 1 tablet  1 tablet Per Tube BID Joselyn Glassman A, RPH   1 tablet at 11/18/21 0805  ? thiamine tablet 100 mg  100 mg Per NG tube Daily Elgergawy, Silver Huguenin, MD   100 mg at 11/18/21 0804  ? tobramycin-dexamethasone (TOBRADEX) ophthalmic suspension 2 drop  2 drop Left EAR Q6H Little Ishikawa, MD   2 drop at 11/18/21 1643  ? ?Allergies  ?Allergen Reactions  ? Amlodipine Swelling  ? Aspirin Other (See Comments)  ?  High Doses Mess up her stomach; "makes my bowels have blood in them". ?Takes 81 mg EC Aspirin   ? Nitrofurantoin Hives  ? Penicillins Other (See Comments)  ?  11/2021: tolerated Zosyn x 1 wk (2017) ? ?SYNCOPE? , "makes me real weak when I take it; like I'll pass out" ? ?Has  patient had a PCN reaction causing immediate rash, facial/tongue/throat swelling, SOB or lightheadedness with hypotension: Yes ?Has patient had a PCN reaction causing severe rash involving mucus membranes or skin necrosis: no ?Has patient had a PCN reaction that required hospitalization no ?Has patient had  a PCN reaction occurring within the last 10 years: no ?If all of the above ?  ? Bactrim [Sulfamethoxazole-Trimethoprim] Rash  ? Contrast Media [Iodinated Contrast Media] Itching  ? Iron Itching and Other (See Comments)  ?  "they gave me iron in dialysis; had to give me Benadryl cause I had to have the iron" (05/02/2012)  ? Tylenol [Acetaminophen] Itching and Other (See Comments)  ?  Makes her feet on fire per pt  ? Gabapentin Other (See Comments)  ?  Unknown reaction  ? Iron Sucrose Other (See Comments)  ?  Unknown  ? Ranexa [Ranolazine] Other (See Comments)  ?  Myoclonus-hospitalized   ? Sucroferric Oxyhydroxide Other (See Comments)  ?  Unknown  ? Dexilant [Dexlansoprazole] Other (See Comments)  ?  Upset stomach  ? Hydralazine Itching  ?  Has tolerated while inpatient ?Has tolerated while inpatient  ? Levaquin [Levofloxacin In D5w] Rash  ? Morphine And Related Itching and Other (See Comments)  ?  Itching in feet  ? Pantoprazole Rash  ? Plavix [Clopidogrel Bisulfate] Rash  ? Protonix [Pantoprazole Sodium] Rash  ? Venofer [Ferric Oxide] Itching and Other (See Comments)  ?  Patient reports using Benadryl prior to doses as [place]  ? ?Principal Problem: ?  Acute encephalopathy ?Active Problems: ?  Hx of CABG ?  Chronic systolic heart failure ([place]) ?  Essential hypertension ?  History of colon cancer ?  History of ovarian cancer ?  ESRD (end stage renal disease) on dialysis [facility]) ?  PAF (paroxysmal atrial fibrillation) ([place]) ?  Acute metabolic encephalopathy ? ?Blood pressure (!) 162/73, pulse 61, temperature 97.9 ?F (36.6 ?C), temperature source Axillary, resp. rate 16, height '5\' 1"'$  (1.549 m), weight 56.8 kg,  SpO2 98 %. ? ?Subjective ?Objective: ?Vital signs: (most recent): Blood pressure (!) 162/73, pulse 61, temperature 97.9 ?F (36.6 ?C), temperature source Axillary, resp. rate 16, height '5\' 1"'$  (1.549 m

## 2021-11-19 ENCOUNTER — Inpatient Hospital Stay (HOSPITAL_COMMUNITY): Payer: Medicare HMO

## 2021-11-19 DIAGNOSIS — I5022 Chronic systolic (congestive) heart failure: Secondary | ICD-10-CM | POA: Diagnosis not present

## 2021-11-19 DIAGNOSIS — H6092 Unspecified otitis externa, left ear: Secondary | ICD-10-CM | POA: Diagnosis not present

## 2021-11-19 DIAGNOSIS — N186 End stage renal disease: Secondary | ICD-10-CM | POA: Diagnosis not present

## 2021-11-19 DIAGNOSIS — G9340 Encephalopathy, unspecified: Secondary | ICD-10-CM | POA: Diagnosis not present

## 2021-11-19 LAB — RENAL FUNCTION PANEL
Albumin: 2.5 g/dL — ABNORMAL LOW (ref 3.5–5.0)
Anion gap: 13 (ref 5–15)
BUN: 48 mg/dL — ABNORMAL HIGH (ref 8–23)
CO2: 25 mmol/L (ref 22–32)
Calcium: 9.6 mg/dL (ref 8.9–10.3)
Chloride: 98 mmol/L (ref 98–111)
Creatinine, Ser: 6.43 mg/dL — ABNORMAL HIGH (ref 0.44–1.00)
GFR, Estimated: 6 mL/min — ABNORMAL LOW (ref >60)
Glucose, Bld: 123 mg/dL — ABNORMAL HIGH (ref 70–99)
Phosphorus: 3.8 mg/dL (ref 2.5–4.6)
Potassium: 4.7 mmol/L (ref 3.5–5.1)
Sodium: 136 mmol/L (ref 135–145)

## 2021-11-19 LAB — CBC
HCT: 31.5 % — ABNORMAL LOW (ref 36.0–46.0)
Hemoglobin: 10.2 g/dL — ABNORMAL LOW (ref 12.0–15.0)
MCH: 32.4 pg (ref 26.0–34.0)
MCHC: 32.4 g/dL (ref 30.0–36.0)
MCV: 100 fL (ref 80.0–100.0)
Platelets: 246 10*3/uL (ref 150–400)
RBC: 3.15 MIL/uL — ABNORMAL LOW (ref 3.87–5.11)
RDW: 20.8 % — ABNORMAL HIGH (ref 11.5–15.5)
WBC: 10.1 10*3/uL (ref 4.0–10.5)
nRBC: 0 % (ref 0.0–0.2)

## 2021-11-19 LAB — GLUCOSE, CAPILLARY
Glucose-Capillary: 113 mg/dL — ABNORMAL HIGH (ref 70–99)
Glucose-Capillary: 92 mg/dL (ref 70–99)

## 2021-11-19 LAB — METHYLMALONIC ACID, SERUM: Methylmalonic Acid, Quantitative: 1255 nmol/L — ABNORMAL HIGH (ref 0–378)

## 2021-11-19 IMAGING — DX DG CHEST 1V PORT SAME DAY
1 series · 1 of 1 positions shown · non-contrast
Comparison: [DATE].

CLINICAL DATA: A female at age 80 presents with shortness of breath
and insomnia.

EXAM:
PORTABLE CHEST 1 VIEW

[chest]
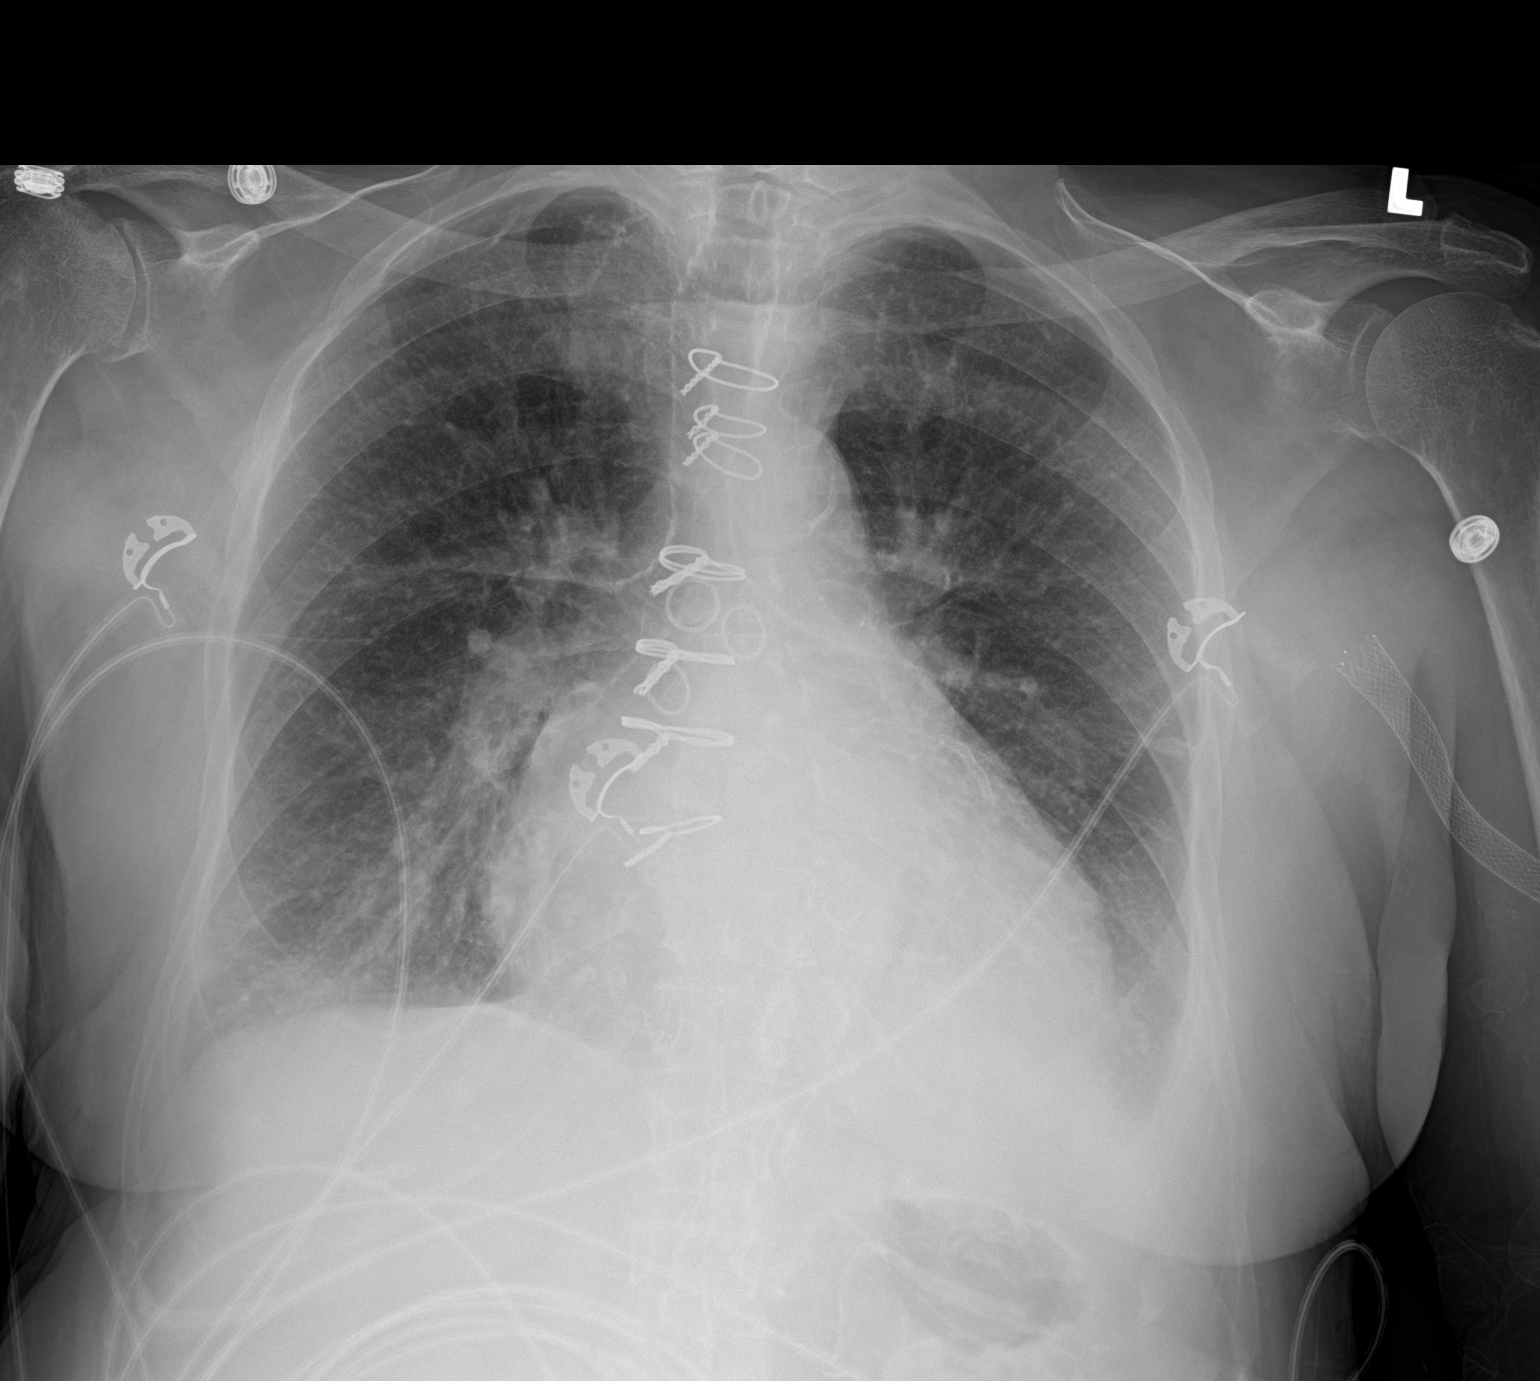

[1 of 1 positions shown; findings below may reference images not displayed]

FINDINGS: Post median sternotomy for CABG.  EKG leads project over the chest.

Cardiomediastinal contours and hilar structures are stable with
moderate cardiac enlargement and signs of prior percutaneous
coronary intervention as well.

Blunting of LEFT costodiaphragmatic sulcus, slightly obscured LEFT
hemidiaphragm with subtle airspace disease at the RIGHT lung base as
well.

No lobar consolidation.

No pneumothorax.  Mild increased interstitial markings.

On limited assessment no acute skeletal findings.
IMPRESSION: Cardiomegaly with signs of CABG, constellation of findings most
suggestive of mild CHF or volume overload with small LEFT and
potential trace RIGHT effusion and associated airspace disease
likely atelectasis. Difficult to exclude infection at the lung
bases.

## 2021-11-19 MED ORDER — VITAMIN E 45 MG (100 UNIT) PO CAPS
400.0000 [IU] | ORAL_CAPSULE | Freq: Every day | ORAL | Status: DC
  Administered 2021-11-19 – 2021-11-24 (×6): 400 [IU] via ORAL
  Filled 2021-11-19 (×7): qty 4

## 2021-11-19 MED ORDER — HYDRALAZINE HCL 20 MG/ML IJ SOLN
5.0000 mg | INTRAMUSCULAR | Status: DC | PRN
Start: 2021-11-19 — End: 2021-11-25
  Administered 2021-11-19: 5 mg via INTRAVENOUS

## 2021-11-19 MED ORDER — VITAMIN B-6 25 MG PO TABS
100.0000 mg | ORAL_TABLET | Freq: Every day | ORAL | Status: DC
  Administered 2021-11-19 – 2021-11-24 (×6): 100 mg via ORAL
  Filled 2021-11-19 (×6): qty 4

## 2021-11-19 NOTE — Progress Notes (Signed)
?      ?                 PROGRESS NOTE ? ?      ?PATIENT DETAILS ?Name: Shawna [name] ?Age: 81 y.o. ?Sex: female ?Date of Birth: 1941-03-26 ?Admit Date: 11/08/2021 ?Admitting Physician Little Ishikawa, MD ?RWE:[id number], [facility] ? ?Brief Summary: ?81 year old with PAF, CAD s/p CABG/PCI, ESRD on HD MWF-who had a known left ear infection-followed by ENT-was on topical antimicrobial eardrops (cultures positive for Pseudomonas)-presented with worsening encephalopathy.  Patient was subsequently admitted to the hospitalist service for further evaluation and treatment.  See below for further details. ? ?Significant events: ?5/1>> admit for evaluation of altered mental status. ? ?Significant studies: ?4/21>> CT temporal bone: Complete opacification of the left external auditory canal/left middle ear cavity, small focus of osseous erosion at the left mastoid air cells.  Findings favored to reflect acute posterior mastoiditis and external otitis. ?5/02>> MRI brain: No acute findings. ?5/6-5/7>> LTM EEG: No seizures. ? ?Significant microbiology data: ?5/1>> blood culture: Negative ?5/2>> left ear discharge culture: Pseudomonas ? ?Procedures: ?5/08>> cortrak placed ?5/11>>cortrak removed ? ?Consults: ?ENT, nephrology, neurology, cardiology ? ?Subjective: ?Much better today-awake-alert-answers most of my questions appropriately.  NG tube was removed yesterday.  Had episode of shortness of breath last night that resolved with bronchodilators. ? ?Objective: ?Vitals: ?Blood pressure (!) 171/59, pulse 61, temperature 97.9 ?F (36.6 ?C), temperature source Temporal, resp. rate (!) 35, height '5\' 1"'$  (1.549 m), weight 56.5 kg, SpO2 98 %.  ? ?Exam: ?Gen Exam:Alert awake-not in any distress ?HEENT:atraumatic, normocephalic ?Chest: B/L clear to auscultation anteriorly ?CVS:S1S2 regular ?Abdomen:soft non tender, non distended ?Extremities:no edema ?Neurology: Non focal ?Skin: no rash  ? ?Pertinent Labs/Radiology: ? ?  Latest Ref Rng  & Units 11/19/2021  ?  8:30 AM 11/18/2021  ?  5:00 AM 11/17/2021  ?  6:12 AM  ?CBC  ?WBC 4.0 - 10.5 K/uL 10.1   6.7   6.6    ?Hemoglobin 12.0 - 15.0 g/dL 10.2   10.3   9.3    ?Hematocrit 36.0 - 46.0 % 31.5   32.5   28.8    ?Platelets 150 - 400 K/uL 246   PLATELET CLUMPS NOTED ON SMEAR, UNABLE TO ESTIMATE   PLATELET CLUMPS NOTED ON SMEAR, UNABLE TO ESTIMATE    ?  ?Lab Results  ?Component Value Date  ? NA 136 11/19/2021  ? K 4.7 11/19/2021  ? CL 98 11/19/2021  ? CO2 25 11/19/2021  ? ?  ?Assessment/Plan: ?Acute metabolic encephalopathy: Felt to be multifactorial-due to malignant otitis externa/mastoiditis, cefepime induced neurotoxicity, possible side effect of gabapentin/Xanax/narcotics.  Neuroimaging negative for acute abnormalities-EEG negative for seizures.  Mental status continues to slowly improve-at times does wax and wane.  Continue supportive care-NG tube discontinued yesterday-Per nursing staff-she is eating a significant amount of her meals. ? ?Malignant otitis externa due to Pseudomonas: Continue IV Zosyn/TobraDex drops.  Recommendations are for IV antibiotics x3 weeks. ? ?ESRD on HD MWF: Nephrology following. ? ?Normocytic anemia: S/p PRBC transfusion on 5/3-hemoglobin stable.  Continue Aranesp ? ?PAF with RVR: Maintaining sinus rhythm-on amiodarone-not on anticoagulation due to GI bleeding.  Recommendations are for amiodarone 400 mg twice daily for 2 weeks, then decrease to 200 mg daily. ? ?CAD s/p CABG: No anginal symptoms-continue statin/beta-blocker. ? ?Acute on chronic HFrEF (EF 30-35% on 07/16/2021): Developed mild shortness of breath on 5/11 evening-improved with bronchodilators-CXR this morning shows pulmonary edema.  Getting HD today.  Watch closely.   ? ?  HTN: BP on the higher side this morning-continue metoprolol-reassess after hemodialysis.  May need to add other agents. ? ?History of colon cancer/ovarian cancer: Completed previous therapy-follow-up with oncology in the outpatient  setting. ? ?Nutrition Status: ?Nutrition Problem: Moderate Malnutrition ?Etiology: acute illness, chronic illness (acute enncephalopathy with refusal to take po, ESRD/HD) ?Signs/Symptoms: mild fat depletion, moderate fat depletion ?Interventions: Refer to RD note for recommendations, Tube feeding, MVI ? ?BMI: ?Estimated body mass index is 23.54 kg/m? as calculated from the following: ?  Height as of this encounter: '5\' 1"'$  (1.549 m). ?  Weight as of this encounter: 56.5 kg.  ? ?Code status: ?  Code Status: Full Code  ? ?DVT Prophylaxis: ?heparin injection 5,000 Units Start: 11/11/21 1400 ?Place and maintain sequential compression device Start: 11/09/21 0355 ?  ?Family Communication: Spouse-Nathaniel [phone] on 5/11 ? ? ?Disposition Plan: ?Status is: Inpatient ?Remains inpatient appropriate because: Resolving encephalopathy-we will attempt to discontinue NG tube today-not yet stable for discharge. ?  ?Planned Discharge Destination:Skilled nursing facility ? ? ?Diet: ?Diet Order   ? ?       ?  Diet regular Room service appropriate? Yes; Fluid consistency: Thin  Diet effective now       ?  ? ?  ?  ? ?  ?  ? ? ?Antimicrobial agents: ?Anti-infectives (From admission, onward)  ? ? Start     Dose/Rate Route Frequency Ordered Stop  ? 11/15/21 1200  ceFEPIme (MAXIPIME) 2 g in sodium chloride 0.9 % 100 mL IVPB  Status:  Discontinued       ? 2 g ?200 mL/hr over 30 Minutes Intravenous Every M-W-F (Hemodialysis) 11/13/21 1328 11/14/21 0717  ? 11/15/21 1200  cefTAZidime (FORTAZ) 2 g in sodium chloride 0.9 % 100 mL IVPB  Status:  Discontinued       ? 2 g ?200 mL/hr over 30 Minutes Intravenous Every M-W-F (Hemodialysis) 11/14/21 1252 11/14/21 1613  ? 11/14/21 1900  cefTAZidime (FORTAZ) 1 g in sodium chloride 0.9 % 100 mL IVPB  Status:  Discontinued       ? 1 g ?200 mL/hr over 30 Minutes Intravenous  Once 11/14/21 1252 11/14/21 1613  ? 11/14/21 1800  piperacillin-tazobactam (ZOSYN) IVPB 2.25 g       ? 2.25 g ?100 mL/hr  over 30 Minutes Intravenous Every 8 hours 11/14/21 1639 12/02/21 0359  ? 11/11/21 1800  ceFEPIme (MAXIPIME) 1 g in sodium chloride 0.9 % 100 mL IVPB  Status:  Discontinued       ? 1 g ?200 mL/hr over 30 Minutes Intravenous Daily 11/10/21 0919 11/14/21 0717  ? 11/10/21 1800  ceFEPIme (MAXIPIME) 500 mg in dextrose 5 % 50 mL IVPB       ? 500 mg ?110 mL/hr over 30 Minutes Intravenous  Once 11/10/21 4782 11/10/21 1752  ? 11/10/21 1600  ceFEPIme (MAXIPIME) 1 g in sodium chloride 0.9 % 100 mL IVPB  Status:  Discontinued       ? 1 g ?200 mL/hr over 30 Minutes Intravenous Daily 11/10/21 0911 11/10/21 0919  ? 11/10/21 1015  ceFEPIme (MAXIPIME) 1 g in sodium chloride 0.9 % 100 mL IVPB       ? 1 g ?200 mL/hr over 30 Minutes Intravenous  Once 11/10/21 0916 11/10/21 1052  ? 11/09/21 1800  cefTRIAXone (ROCEPHIN) 2 g in sodium chloride 0.9 % 100 mL IVPB  Status:  Discontinued       ? 2 g ?200 mL/hr over 30 Minutes Intravenous Every 24 hours  11/08/21 2151 11/10/21 0905  ? 11/08/21 2000  cefTRIAXone (ROCEPHIN) 2 g in sodium chloride 0.9 % 100 mL IVPB       ? 2 g ?200 mL/hr over 30 Minutes Intravenous  Once 11/08/21 1947 11/08/21 2121  ? ?  ? ? ? ?MEDICATIONS: ?Scheduled Meds: ? amiodarone  400 mg Per Tube BID  ? Followed by  ? [START ON 11/29/2021] amiodarone  200 mg Per Tube Daily  ? Chlorhexidine Gluconate Cloth  6 each Topical Q0600  ? cinacalcet  30 mg Oral Q M,W,F-1800  ? cyanocobalamin  1,000 mcg Intramuscular Q0600  ? Followed by  ? [START ON 11/21/2021] cyanocobalamin  1,000 mcg Intramuscular Weekly  ? darbepoetin (ARANESP) injection - DIALYSIS  100 mcg Intravenous Q Wed-HD  ? heparin injection (subcutaneous)  5,000 Units Subcutaneous Q8H  ? levothyroxine  25 mcg Per Tube Q0600  ? LORazepam  0.5 mg Intravenous QHS  ? metoprolol tartrate  25 mg Per Tube BID  ? multivitamin  1 tablet Per Tube Daily  ? rosuvastatin  10 mg Per Tube Daily  ? senna-docusate  1 tablet Per Tube BID  ? thiamine  100 mg Per NG tube Daily  ?  tobramycin-dexamethasone  2 drop Left EAR Q6H  ? ?Continuous Infusions: ? sodium chloride    ? sodium chloride    ? sodium chloride Stopped (11/12/21 1719)  ? diphenhydrAMINE 12.5 mg (11/14/21 2053)  ? piperacillin-tazobactam (ZOSYN

## 2021-11-19 NOTE — Progress Notes (Signed)
Nutrition Follow-up ? ?DOCUMENTATION CODES:  ? ?Non-severe (moderate) malnutrition in context of acute illness/injury (acute on chronic malnutrition) ? ?INTERVENTION:  ? ?Continue Renal Multivitamin w/ minerals daily ?Start 100 mg Vitamin B6 and 400 units Vitamin E for 14 doses. Recommend rechecking labs after completion supplements.   ? ?NUTRITION DIAGNOSIS:  ? ?Moderate Malnutrition related to acute illness, chronic illness (acute enncephalopathy with refusal to take po, ESRD/HD) as evidenced by mild fat depletion, moderate fat depletion. - Ongoing ? ?GOAL:  ? ?Patient will meet greater than or equal to 90% of their needs - Progressing  ? ?MONITOR:  ? ?PO intake, Supplement acceptance, Labs, Weight trends ? ?REASON FOR ASSESSMENT:  ? ?Consult ?Enteral/tube feeding initiation and management ? ?ASSESSMENT:  ? ?81 yo female admitted with acute metabolic encephalopathy, malignant otitis externa. PMH includes ESRD on HD, CAD/CABG, hx colon and ovarian cancer ? ?5/08 - Cortrak placed  ?5/11 - Cortrak removed   ? ?Pt laying in bed, reports that she has been eating well. Denies any ONS use at home. Denies and nausea or vomiting.  ?Per EMR, pt ate 100% of lunch and dinner yesterday.  ? ?Pt with micronutrient deficiencies; reached out to MD to replete. MD provided RD will ok to order.  ? ?Vitamin Labs:  ?B-12: 319 (WNL) ?Folate: >40 (WNL) ?Vitamin E: 5.2 (low) ?CRP: 6.2 (high) ?Vitamin B6: 2.6 (low) ?Copper: 124 (WNL) ?Zinc: 69 (WNL) ? ?Medications reviewed and include: Cinacalcet, Vitamin B12, Aranesp, Rena-vit, Senokot, Thiamine, IV antibiotics ?Labs reviewed.  ? ?HD on 5/12  ?EDW: 56.5 kg ?New UF: 2500 mL ?Post- Dialysis Weight: 54 kg  ? ?Diet Order:   ?Diet Order   ? ?       ?  Diet regular Room service appropriate? Yes; Fluid consistency: Thin  Diet effective now       ?  ? ?  ?  ? ?  ? ?EDUCATION NEEDS:  ? ?No education needs have been identified at this time ? ?Skin:  Skin Assessment: Reviewed RN Assessment ? ?Last  BM:  5/11 ? ?Height:  ?Ht Readings from Last 1 Encounters:  ?11/09/21 '5\' 1"'$  (1.549 m)  ? ?Weight:  ?Wt Readings from Last 1 Encounters:  ?11/19/21 54 kg  ? ?BMI:  Body mass index is 22.49 kg/m?. ? ?Estimated Nutritional Needs:  ? ?Kcal:  1650-1850 ? ?Protein:  80-95 grams ? ?Fluid:  UOP + 1L ? ? ? ?Hermina Barters RD, LDN ?Clinical Dietitian ?See AMiON for contact information.  ? ?

## 2021-11-19 NOTE — Progress Notes (Signed)
Shawna [name] is a 81 y.o. female patient. Patient attended HD this morning and arrived back on the unit at 1338. VSS. AOX1 to self. IV antibiotics continued. POC to discharge to SNF, pending placement disposition. Patient on HD on MWF for ESRD. Pending PT/OT consult. Q2 turns facilitated with 1 person assistance. POCT CBG continued. ? ?1. Altered mental status, unspecified altered mental status type   ?2. Encephalopathy acute   ?3. Otitis externa of left ear, unspecified chronicity, unspecified type   ? ?Past Medical History:  ?Diagnosis Date  ? Acute on chronic respiratory failure with hypoxia ([place]) 10/10/2016  ? Anxiety   ? Arthritis   ? AVM (arteriovenous malformation) of colon   ? CAD (coronary artery disease)   ? a. s/p CABG in 2013 b. DES to D1 in 10/2016. c. cath in 07/2018 showing patent grafts with occlusion of D1 at prior stent site and progression of PDA disease --> medical management recommended  ? Carotid artery disease ([place])   ? a. 06-23% LICA, 01/6282   ? Chronic anemia   ? Chronic bronchitis ([place])   ? Chronic diastolic CHF (congestive heart failure) ([place])   ? a. 02/2012 Echo EF 60-65%, nl wall motion, Gr 1 DD, mod MR  ? Colon cancer ([place]) 1992  ? Esophageal stricture   ? ESRD on hemodialysis ([place])   ? ESRD due to HTN, started dialysis 2011 and gets HD at [facility] with Dr Hinda Lenis on MWF schedule.  Access is LUA AVF as of Sept 2014.   ? GERD (gastroesophageal reflux disease)   ? High cholesterol 12/2011  ? History of blood transfusion 07/2011; 12/2011; 01/2012 X 2; 04/2012  ? History of gout   ? History of lower GI bleeding   ? Hypertension   ? Iron deficiency anemia   ? Jugular vein occlusion, right ([place])   ? Mitral regurgitation   ? a. Moderate by echo, 02/2012  ? Myocardial infarction [facility])   ? NSVT (nonsustained ventricular tachycardia) ([place])   ? Ovarian cancer ([place]) 1992  ? PAF (paroxysmal atrial fibrillation) ([place])   ? Pneumonia ~ 2009  ? PUD (peptic ulcer disease)   ? TIA (transient ischemic  attack)   ? ?Current Facility-Administered Medications  ?Medication Dose Route Frequency Provider Last Rate Last Admin  ? 0.9 %  sodium chloride infusion  100 mL Intravenous PRN [name], MD      ? 0.9 %  sodium chloride infusion  100 mL Intravenous PRN [name], MD      ? 0.9 %  sodium chloride infusion   Intravenous PRN Elgergawy, Silver Huguenin, MD   Stopped at 11/12/21 1719  ? alteplase (CATHFLO ACTIVASE) injection 2 mg  2 mg Intracatheter Once PRN [name], MD      ? amiodarone (PACERONE) tablet 400 mg  400 mg Per Tube BID Jonetta Osgood, MD   400 mg at 11/19/21 1404  ? Followed by  ? Derrill Memo ON 11/29/2021] amiodarone (PACERONE) tablet 200 mg  200 mg Per Tube Daily Ghimire, Henreitta Leber, MD      ? Chlorhexidine Gluconate Cloth 2 % PADS 6 each  6 each Topical Q0600 [name], MD   6 each at 11/19/21 0600  ? cinacalcet (SENSIPAR) tablet 30 mg  30 mg Oral Q M,W,F-1800 Collins, Samantha G, PA-C      ? cyanocobalamin ((VITAMIN B-12)) injection 1,000 mcg  1,000 mcg Intramuscular Q0600 Elgergawy, Silver Huguenin, MD   1,000 mcg at 11/19/21 1343  ?  Followed by  ? [START ON 11/21/2021] cyanocobalamin ((VITAMIN B-12)) injection 1,000 mcg  1,000 mcg Intramuscular Weekly Elgergawy, Silver Huguenin, MD      ? Darbepoetin Alfa (ARANESP) injection 100 mcg  100 mcg Intravenous Q Wed-HD Loren Racer, PA-C   100 mcg at 11/17/21 0856  ? diphenhydrAMINE (BENADRYL) 12.5 mg in sodium chloride 0.9 % 50 mL IVPB  12.5 mg Intravenous Q6H PRN Elgergawy, Silver Huguenin, MD 100 mL/hr at 11/14/21 2053 12.5 mg at 11/14/21 2053  ? guaiFENesin-dextromethorphan (ROBITUSSIN DM) 100-10 MG/5ML syrup 5 mL  5 mL Oral Q4H PRN Shela Leff, MD   5 mL at 11/18/21 2220  ? heparin injection 1,000 Units  1,000 Units Dialysis PRN [name], MD      ? heparin injection 5,000 Units  5,000 Units Subcutaneous Q8H Elgergawy, Silver Huguenin, MD   5,000 Units at 11/18/21 2333  ? hydrALAZINE (APRESOLINE) injection 5 mg  5 mg Intravenous Q4H PRN  Shela Leff, MD   5 mg at 11/19/21 7867  ? hydrOXYzine (ATARAX) tablet 25 mg  25 mg Per Tube TID PRN Joselyn Glassman A, RPH   25 mg at 11/17/21 1557  ? levothyroxine (SYNTHROID) tablet 25 mcg  25 mcg Per Tube Q0600 Joselyn Glassman A, RPH   25 mcg at 11/18/21 0538  ? lidocaine (PF) (XYLOCAINE) 1 % injection 5 mL  5 mL Intradermal PRN [name], MD      ? lidocaine-prilocaine (EMLA) cream 1 application.  1 application. Topical PRN [name], MD      ? LORazepam (ATIVAN) injection 0.5 mg  0.5 mg Intravenous QHS Elgergawy, Silver Huguenin, MD   0.5 mg at 11/17/21 2210  ? metoprolol tartrate (LOPRESSOR) tablet 25 mg  25 mg Per Tube BID Elgergawy, Silver Huguenin, MD   25 mg at 11/19/21 1340  ? multivitamin (RENA-VIT) tablet 1 tablet  1 tablet Per Tube Daily Joselyn Glassman A, RPH   1 tablet at 11/19/21 1339  ? pentafluoroprop-tetrafluoroeth (GEBAUERS) aerosol 1 application.  1 application. Topical PRN [name], MD      ? piperacillin-tazobactam (ZOSYN) IVPB 2.25 g  2.25 g Intravenous Q8H Skeet Simmer, RPH 100 mL/hr at 11/19/21 0525 2.25 g at 11/19/21 0525  ? rosuvastatin (CRESTOR) tablet 10 mg  10 mg Per Tube Daily Pierce, Dwayne A, RPH   10 mg at 11/19/21 1339  ? senna-docusate (Senokot-S) tablet 1 tablet  1 tablet Per Tube BID Joselyn Glassman A, RPH   1 tablet at 11/18/21 0805  ? thiamine tablet 100 mg  100 mg Per NG tube Daily Elgergawy, Silver Huguenin, MD   100 mg at 11/19/21 1340  ? tobramycin-dexamethasone (TOBRADEX) ophthalmic suspension 2 drop  2 drop Left EAR Q6H Reome, Earle J, RPH   2 drop at 11/19/21 1341  ? vitamin B-6 (pyridOXINE) tablet 100 mg  100 mg Oral Daily Ghimire, Henreitta Leber, MD      ? vitamin E capsule 400 Units  400 Units Oral Daily Ghimire, Henreitta Leber, MD      ? ?Allergies  ?Allergen Reactions  ? Amlodipine Swelling  ? Aspirin Other (See Comments)  ?  High Doses Mess up her stomach; "makes my bowels have blood in them". ?Takes 81 mg EC Aspirin   ? Nitrofurantoin Hives  ? Penicillins Other  (See Comments)  ?  11/2021: tolerated Zosyn x 1 wk (2017) ? ?SYNCOPE? , "makes me real weak when I take it; like I'll pass out" ? ?Has patient had a PCN  reaction causing immediate rash, facial/tongue/throat swelling, SOB or lightheadedness with hypotension: Yes ?Has patient had a PCN reaction causing severe rash involving mucus membranes or skin necrosis: no ?Has patient had a PCN reaction that required hospitalization no ?Has patient had a PCN reaction occurring within the last 10 years: no ?If all of the above ?  ? Bactrim [Sulfamethoxazole-Trimethoprim] Rash  ? Contrast Media [Iodinated Contrast Media] Itching  ? Iron Itching and Other (See Comments)  ?  "they gave me iron in dialysis; had to give me Benadryl cause I had to have the iron" (05/02/2012)  ? Tylenol [Acetaminophen] Itching and Other (See Comments)  ?  Makes her feet on fire per pt  ? Gabapentin Other (See Comments)  ?  Unknown reaction  ? Iron Sucrose Other (See Comments)  ?  Unknown  ? Ranexa [Ranolazine] Other (See Comments)  ?  Myoclonus-hospitalized   ? Sucroferric Oxyhydroxide Other (See Comments)  ?  Unknown  ? Dexilant [Dexlansoprazole] Other (See Comments)  ?  Upset stomach  ? Hydralazine Itching  ?  Has tolerated while inpatient ?Has tolerated while inpatient  ? Levaquin [Levofloxacin In D5w] Rash  ? Morphine And Related Itching and Other (See Comments)  ?  Itching in feet  ? Pantoprazole Rash  ? Plavix [Clopidogrel Bisulfate] Rash  ? Protonix [Pantoprazole Sodium] Rash  ? Venofer [Ferric Oxide] Itching and Other (See Comments)  ?  Patient reports using Benadryl prior to doses as [place]  ? ?Principal Problem: ?  Acute encephalopathy ?Active Problems: ?  Hx of CABG ?  Chronic systolic heart failure ([employer]) ?  Essential hypertension ?  History of colon cancer ?  History of ovarian cancer ?  ESRD (end stage renal disease) on dialysis [facility]) ?  PAF (paroxysmal atrial fibrillation) ([place]) ?  Acute metabolic encephalopathy ? ?Blood pressure (!)  123/46, pulse 61, temperature (!) 97.5 ?F (36.4 ?C), temperature source Temporal, resp. rate 15, height '5\' 1"'$  (1.549 m), weight 54 kg, SpO2 98 %. ? ?Subjective ?Objective: ?Vital signs: (most recent): Blood pr

## 2021-11-19 NOTE — Progress Notes (Signed)
?[facility] ?Progress Note  ? ?Subjective:   Seen on HD - awake and interactive this AM.   BPs have been running high - was 150s this AM 6-7am but 190/70 pre HD.  No HA.  ? ?Objective ?Vitals:  ? 11/18/21 2308 11/19/21 0250 11/19/21 0303 11/19/21 0719  ?BP:  (!) 188/59 (!) 188/56 (!) 151/74  ?Pulse: 67  63 65  ?Resp: '20  17 17  '$ ?Temp: 98 ?F (36.7 ?C)  98.1 ?F (36.7 ?C) 98 ?F (36.7 ?C)  ?TempSrc: Oral  Oral Oral  ?SpO2: 100%     ?Weight:      ?Height:      ? ?Physical Exam ?General: Elderly female, alert and in NAD. ?Heart: RRR, no murmurs, rubs or gallops ?Lungs: CTA bilaterally without wheezing, rhonchi or rales ?Abdomen: Soft, non-distended, +BS ?Extremities: No edema b/l lower extremities ?Dialysis Access: LUE AVF + t/b ?Neuro:  AO to [place]; not agitated ? ?Additional Objective ?Labs: ?Basic Metabolic Panel: ?Recent Labs  ?Lab 11/16/21 ?0618 11/16/21 ?1637 11/17/21 ?0612 11/18/21 ?0505  ?NA 137  --  139 137  ?K 3.7  --  3.8 4.3  ?CL 99  --  101 99  ?CO2 26  --  27 28  ?GLUCOSE 138*  --  134* 150*  ?BUN 21  --  40* 28*  ?CREATININE 4.33*  --  5.98* 4.04*  ?CALCIUM 9.2  --  8.9 9.1  ?PHOS 2.8 3.1 3.5 2.7  ? ? ?Liver Function Tests: ?Recent Labs  ?Lab 11/15/21 ?1305 11/17/21 ?0612 11/18/21 ?0505  ?ALBUMIN 2.3* 2.1* 2.4*  ? ? ?No results for input(s): LIPASE, AMYLASE in the last 168 hours. ?CBC: ?Recent Labs  ?Lab 11/14/21 ?0017 11/15/21 ?1306 11/16/21 ?0618 11/17/21 ?2595 11/18/21 ?0500  ?WBC 7.7 9.4 6.1 6.6 6.7  ?HGB 10.1* 9.1* 9.9* 9.3* 10.3*  ?HCT 32.2* 28.3* 31.2* 28.8* 32.5*  ?MCV 102.2* 101.4* 101.0* 101.1* 101.9*  ?PLT 187 248 PLATELET CLUMPS NOTED ON SMEAR, UNABLE TO ESTIMATE PLATELET CLUMPS NOTED ON SMEAR, UNABLE TO ESTIMATE PLATELET CLUMPS NOTED ON SMEAR, UNABLE TO ESTIMATE  ? ? ?Blood Culture ?   ?Component Value Date/Time  ? SDES EAR 11/09/2021 1530  ? SPECREQUEST  11/09/2021 1530  ?  LEFT EAR ?Performed at [facility], [place] [address] [place], [place] [address] ?   ? CULT RARE PSEUDOMONAS AERUGINOSA 11/09/2021 1530  ? REPTSTATUS 11/11/2021 FINAL 11/09/2021 1530  ? ? ?CBG: ?Recent Labs  ?Lab 11/18/21 ?0924 11/18/21 ?1543 11/18/21 ?1957 11/18/21 ?2334 11/19/21 ?0357  ?GLUCAP 152* 115* 110* 135* 113*  ? ? ? ?Medications: ? sodium chloride    ? sodium chloride    ? sodium chloride Stopped (11/12/21 1719)  ? diphenhydrAMINE 12.5 mg (11/14/21 2053)  ? piperacillin-tazobactam (ZOSYN)  IV 2.25 g (11/19/21 0525)  ? ? amiodarone  400 mg Per Tube BID  ? Followed by  ? [START ON 11/29/2021] amiodarone  200 mg Per Tube Daily  ? Chlorhexidine Gluconate Cloth  6 each Topical Q0600  ? cinacalcet  30 mg Oral Q M,W,F-1800  ? cyanocobalamin  1,000 mcg Intramuscular Q0600  ? Followed by  ? [START ON 11/21/2021] cyanocobalamin  1,000 mcg Intramuscular Weekly  ? darbepoetin (ARANESP) injection - DIALYSIS  100 mcg Intravenous Q Wed-HD  ? heparin injection (subcutaneous)  5,000 Units Subcutaneous Q8H  ? levothyroxine  25 mcg Per Tube Q0600  ? LORazepam  0.5 mg Intravenous QHS  ? metoprolol tartrate  25 mg Per Tube  BID  ? midodrine  5-10 mg Per Tube Q M,W,F-HD  ? multivitamin  1 tablet Per Tube Daily  ? rosuvastatin  10 mg Per Tube Daily  ? senna-docusate  1 tablet Per Tube BID  ? thiamine  100 mg Per NG tube Daily  ? tobramycin-dexamethasone  2 drop Left EAR Q6H  ? ? ?Dialysis Orders: ?[facility] MWF ?  3.5h 56.5kg   BFR 300   2/2.5 bath LUA AVF  Heparin none ? - venofer 50 mg q Monday ? - sensipar '30mg'$  PO q HD ? - calcitriol 1.33mg PO q HD ? - mircera 2027m q 2wks, last 4/25 ?- 5/01 lab showed HBsAg neg w/ Abs > 10/ protective ? ?Assessment/Plan: ?AMS/weakness: multifactorial sepsis + polypharmacy vs cephalosporins. Has good HD compliance. Continue to hold gabapentin, oxycodone, and xanax. Seems more alert today.  RN aware to notify me with issues with HD.  ?L ear mastoiditis: + pseudomonas L ear cx, finishing course of Zosyn and antibiotic drops ?Atrial fib - w/ RVR here. Started on IV amio +  metoprolol this admit. ?ESRD: Continue HD per usual MWF schedule. HD today.  ?Hypokalemia - resolved, K 4.3. Using 3K bath with HD ?BP/volume: Typically uses midodrine with HD - was normotensive now quite HTN. D/Cdmidodrine for now.  Will ^ UF with HD today - she's net + 2.2 for admission.  UFG 3L for today.  Has PRN hydralazine on MAR.  ?Anemia of ESRD: Hgb 10.3. Aranesp ordered with HD 5/10. Transfused 2U PRBCs on 11/10/21.   ?Metabolic Bone Disease: Calcium ok on sensipar. Phos low - binder has been on hold.  ?CAD (Hx CABG): on statin ?Hx of colon and ovarian cancer ? ?Dispo - plans for SNF on d/c ? ?LiJannifer HickD ?[facility] ?Pager [phone] ? ? ?

## 2021-11-19 NOTE — Progress Notes (Signed)
Patient has now arrived back to her room on 5W03 from HD at this time.  ?

## 2021-11-19 NOTE — Progress Notes (Signed)
OT Cancellation Note ? ?Patient Details ?Name: Shawna [name] ?MRN: [id number] ?DOB: 30-Mar-1941 ? ? ?Cancelled Treatment:    Reason Eval/Treat Not Completed: Other (comment) (Pt at HD)  Will check back later in the day if time permits.   ? ?Radiah Lubinski OTR/L ?11/19/2021, 1:09 PM ?

## 2021-11-20 DIAGNOSIS — Z85038 Personal history of other malignant neoplasm of large intestine: Secondary | ICD-10-CM | POA: Diagnosis not present

## 2021-11-20 DIAGNOSIS — H6092 Unspecified otitis externa, left ear: Secondary | ICD-10-CM | POA: Diagnosis not present

## 2021-11-20 DIAGNOSIS — G9340 Encephalopathy, unspecified: Secondary | ICD-10-CM | POA: Diagnosis not present

## 2021-11-20 DIAGNOSIS — A498 Other bacterial infections of unspecified site: Secondary | ICD-10-CM | POA: Diagnosis not present

## 2021-11-20 DIAGNOSIS — N186 End stage renal disease: Secondary | ICD-10-CM | POA: Diagnosis not present

## 2021-11-20 DIAGNOSIS — I10 Essential (primary) hypertension: Secondary | ICD-10-CM | POA: Diagnosis not present

## 2021-11-20 DIAGNOSIS — I5022 Chronic systolic (congestive) heart failure: Secondary | ICD-10-CM | POA: Diagnosis not present

## 2021-11-20 LAB — RENAL FUNCTION PANEL
Albumin: 2.6 g/dL — ABNORMAL LOW (ref 3.5–5.0)
Anion gap: 15 (ref 5–15)
BUN: 29 mg/dL — ABNORMAL HIGH (ref 8–23)
CO2: 25 mmol/L (ref 22–32)
Calcium: 9.4 mg/dL (ref 8.9–10.3)
Chloride: 96 mmol/L — ABNORMAL LOW (ref 98–111)
Creatinine, Ser: 4.85 mg/dL — ABNORMAL HIGH (ref 0.44–1.00)
GFR, Estimated: 9 mL/min — ABNORMAL LOW (ref >60)
Glucose, Bld: 97 mg/dL (ref 70–99)
Phosphorus: 4 mg/dL (ref 2.5–4.6)
Potassium: 4.4 mmol/L (ref 3.5–5.1)
Sodium: 136 mmol/L (ref 135–145)

## 2021-11-20 LAB — GLUCOSE, CAPILLARY
Glucose-Capillary: 105 mg/dL — ABNORMAL HIGH (ref 70–99)
Glucose-Capillary: 107 mg/dL — ABNORMAL HIGH (ref 70–99)
Glucose-Capillary: 118 mg/dL — ABNORMAL HIGH (ref 70–99)
Glucose-Capillary: 125 mg/dL — ABNORMAL HIGH (ref 70–99)
Glucose-Capillary: 142 mg/dL — ABNORMAL HIGH (ref 70–99)
Glucose-Capillary: 215 mg/dL — ABNORMAL HIGH (ref 70–99)

## 2021-11-20 MED ORDER — MAGNESIUM SULFATE IN D5W 1-5 GM/100ML-% IV SOLN
1.0000 g | Freq: Once | INTRAVENOUS | Status: AC
  Administered 2021-11-20: 1 g via INTRAVENOUS
  Filled 2021-11-20: qty 100

## 2021-11-20 NOTE — Progress Notes (Signed)
Pharmacy Antibiotic Note ? ?Shawna [name] is a 81 y.o. female admitted on 11/08/2021 with malignant ostitis externa. Ear culture from 11/09/21 with pseudomonas aeruginosa (R imipenem, S ceftazidime and cefepime). Patient has been on cefepime since 11/10/21 with plans to continue until 12/01/21. Pharmacy has been consulted earlier today for change to Ceftazidime, but Dr. Waldron [facility] would like to avoid Ceftazidime as well, for possible potential to contribute to altered mental status. Antibiotic choices were discussed with Dr. Judie Bonus and Dr. Sloan Leiter, and current course is ok for now, although a ceftazidime challenge may be preferred. Reviewed antibiotic history in [facility].  PCN allergy listed since 2013, but she did receive Zosyn for a week during admit 05/2016. Dr. Benjamine Mola wants to stop Tobradex on 5/18. ? ?Plan: ?Continue Zosyn 2.25 gm IV q8hr. ?Continue Tobradex 2 drops in left ear q6h. ?Will follow up antibiotic plans and for any changes to length of therapy or agent. ? ?Height: '5\' 1"'$  (154.9 cm) ?Weight: 54 kg (119 lb 0.8 oz) ?IBW/kg (Calculated) : 47.8 ? ?Temp (24hrs), Avg:98.1 ?F (36.7 ?C), Min:97.5 ?F (36.4 ?C), Max:98.5 ?F (36.9 ?C) ? ?Recent Labs  ?Lab 11/15/21 ?1306 11/16/21 ?0618 11/17/21 ?0612 11/18/21 ?0500 11/18/21 ?0505 11/19/21 ?0830 11/20/21 ?0208  ?WBC 9.4 6.1 6.6 6.7  --  10.1  --   ?CREATININE  --  4.33* 5.98*  --  4.04* 6.43* 4.85*  ? ?  ?Estimated Creatinine Clearance: 6.9 mL/min (A) (by C-G formula based on SCr of 4.85 mg/dL (H)).   ? ?Allergies  ?Allergen Reactions  ? Amlodipine Swelling  ? Aspirin Other (See Comments)  ?  High Doses Mess up her stomach; "makes my bowels have blood in them". ?Takes 81 mg EC Aspirin   ? Nitrofurantoin Hives  ? Penicillins Other (See Comments)  ?  11/2021: tolerated Zosyn x 1 wk (2017) ? ?SYNCOPE? , "makes me real weak when I take it; like I'll pass out" ? ?Has patient had a PCN reaction causing immediate rash, facial/tongue/throat swelling, SOB or lightheadedness with  hypotension: Yes ?Has patient had a PCN reaction causing severe rash involving mucus membranes or skin necrosis: no ?Has patient had a PCN reaction that required hospitalization no ?Has patient had a PCN reaction occurring within the last 10 years: no ?If all of the above ?  ? Bactrim [Sulfamethoxazole-Trimethoprim] Rash  ? Contrast Media [Iodinated Contrast Media] Itching  ? Iron Itching and Other (See Comments)  ?  "they gave me iron in dialysis; had to give me Benadryl cause I had to have the iron" (05/02/2012)  ? Tylenol [Acetaminophen] Itching and Other (See Comments)  ?  Makes her feet on fire per pt  ? Gabapentin Other (See Comments)  ?  Unknown reaction  ? Iron Sucrose Other (See Comments)  ?  Unknown  ? Ranexa [Ranolazine] Other (See Comments)  ?  Myoclonus-hospitalized   ? Sucroferric Oxyhydroxide Other (See Comments)  ?  Unknown  ? Dexilant [Dexlansoprazole] Other (See Comments)  ?  Upset stomach  ? Hydralazine Itching  ?  Has tolerated while inpatient ?Has tolerated while inpatient  ? Levaquin [Levofloxacin In D5w] Rash  ? Morphine And Related Itching and Other (See Comments)  ?  Itching in feet  ? Pantoprazole Rash  ? Plavix [Clopidogrel Bisulfate] Rash  ? Protonix [Pantoprazole Sodium] Rash  ? Venofer [Ferric Oxide] Itching and Other (See Comments)  ?  Patient reports using Benadryl prior to doses as [place]  ? ? ?Antimicrobials this admission: ?Ceftriaxone  5/1 >>  5/2 ?Cefepime 5/3 >> 5/6 ?Tobradex 5/2  >> (5/18) ?Zosyn 5/7 >> (5/24) ? ?Microbiology results: ?5/2 left ear: rare Pseudomonas - sensitive to Cefazidime, Cefepime, Cipro, Gentamicin, Zosyn.  Resistant to Imipenem ?5/1 blood: negative ? ? ?Thank you for allowing pharmacy to be a part of this patient?s care. ? ?Varney Daily, PharmD ?PGY1 Pharmacy Resident ? ?Please check AMION for all [facility] pharmacy phone numbers ?After 10:00 PM call main pharmacy [phone] ? ?11/20/2021 8:49 AM ? ?

## 2021-11-20 NOTE — Progress Notes (Signed)
Overnight progress note ? ?Patient with history of paroxysmal A-fib on amiodarone and metoprolol. Notified by RN that QT interval >600 on telemetry.  Currently in sinus rhythm. Labs done yesterday morning showing potassium 4.7, magnesium was 1.8 on 5/11. ? ?-IV magnesium 1 g x 1 ?-Stat EKG ordered ?-Discussed with cardiology, recommending checking EKG and if it continues to show QT prolongation then hold amiodarone as patient is currently in sinus rhythm. ?-Avoid any other QT prolonging drugs ?

## 2021-11-20 NOTE — Plan of Care (Signed)
°  Problem: Education: °Goal: Knowledge of General Education information will improve °Description: Including pain rating scale, medication(s)/side effects and non-pharmacologic comfort measures °Outcome: Progressing °  °Problem: Health Behavior/Discharge Planning: °Goal: Ability to manage health-related needs will improve °Outcome: Progressing °  °Problem: Clinical Measurements: °Goal: Ability to maintain clinical measurements within normal limits will improve °Outcome: Progressing °Goal: Will remain free from infection °Outcome: Progressing °Goal: Diagnostic test results will improve °Outcome: Progressing °Goal: Respiratory complications will improve °Outcome: Progressing °Goal: Cardiovascular complication will be avoided °Outcome: Progressing °  °Problem: Activity: °Goal: Risk for activity intolerance will decrease °Outcome: Progressing °  °Problem: Nutrition: °Goal: Adequate nutrition will be maintained °Outcome: Progressing °  °Problem: Coping: °Goal: Level of anxiety will decrease °Outcome: Progressing °  °Problem: Elimination: °Goal: Will not experience complications related to bowel motility °Outcome: Progressing °Goal: Will not experience complications related to urinary retention °Outcome: Progressing °  °Problem: Pain Managment: °Goal: General experience of comfort will improve °Outcome: Progressing °  °Problem: Safety: °Goal: Ability to remain free from injury will improve °Outcome: Progressing °  °Problem: Skin Integrity: °Goal: Risk for impaired skin integrity will decrease °Outcome: Progressing °  °Problem: Safety: °Goal: Non-violent Restraint(s) °Outcome: Progressing °  °

## 2021-11-20 NOTE — Consult Note (Signed)
?  [facility]  ? ? ?Date of Admission:  11/08/2021    ? ?Reason for Consult: Malignant otitis externa ?    ?Referring Physician: Dr Sloan Leiter ? ?Current antibiotics: ?Zosyn ? ? ?ASSESSMENT:   ? ?81 y.o. female admitted with: ? ?Malignant otitis externa 2/2 Pseudomonas: currently on Zosyn and improving.  ENT recommending 3 weeks of antibiotics.  Selection complicated by suspected cefepime induced neurotoxicity and FQ's avoided as she is already on amiodarone. ?Encephalopathy: this has apparently improved dramatically today and was suspected to be multifactorial, however, component of cefepime neurotoxicity also considered likely. ?PAF: she is on amiodarone. ? ?RECOMMENDATIONS:   ? ?Continue zosyn for now ?Continue ear drops per ENT ?Preferably could challenge with ceftazidime prior to discharge to complete antibiotic course with HD ?Otherwise, will need a central line placed to receive antibiotics at home ?Will continue to follow  ? ? ?Principal Problem: ?  Acute encephalopathy ?Active Problems: ?  Hx of CABG ?  Chronic systolic heart failure ([place]) ?  Essential hypertension ?  History of colon cancer ?  History of ovarian cancer ?  ESRD (end stage renal disease) on dialysis [facility]) ?  PAF (paroxysmal atrial fibrillation) ([place]) ?  Acute metabolic encephalopathy ? ? ?MEDICATIONS:   ? ?Scheduled Meds: ? Chlorhexidine Gluconate Cloth  6 each Topical Q0600  ? cinacalcet  30 mg Oral Q M,W,F-1800  ? cyanocobalamin  1,000 mcg Intramuscular Q0600  ? Followed by  ? [START ON 11/21/2021] cyanocobalamin  1,000 mcg Intramuscular Weekly  ? darbepoetin (ARANESP) injection - DIALYSIS  100 mcg Intravenous Q Wed-HD  ? heparin injection (subcutaneous)  5,000 Units Subcutaneous Q8H  ? levothyroxine  25 mcg Per Tube Q0600  ? LORazepam  0.5 mg Intravenous QHS  ? metoprolol tartrate  25 mg Per Tube BID  ? multivitamin  1 tablet Per Tube Daily  ? rosuvastatin  10 mg Per Tube Daily  ? senna-docusate  1 tablet Per Tube  BID  ? thiamine  100 mg Per NG tube Daily  ? tobramycin-dexamethasone  2 drop Left EAR Q6H  ? vitamin B-6  100 mg Oral Daily  ? vitamin E  400 Units Oral Daily  ? ?Continuous Infusions: ? sodium chloride    ? sodium chloride    ? sodium chloride Stopped (11/12/21 1719)  ? diphenhydrAMINE 12.5 mg (11/14/21 2053)  ? piperacillin-tazobactam (ZOSYN)  IV 2.25 g (11/20/21 1202)  ? ?PRN Meds:.sodium chloride, sodium chloride, sodium chloride, alteplase, diphenhydrAMINE, guaiFENesin-dextromethorphan, heparin, hydrALAZINE, hydrOXYzine, lidocaine (PF), lidocaine-prilocaine, pentafluoroprop-tetrafluoroeth ? ?HPI:   ? ?Shawna [name] is a 81 y.o. female with a past medical history of atrial fibrillation, CAD status post CABG/PCI, ESRD on HD, and a recent left ear infection as an outpatient on oral antibiotics/topical eardrops who presented 11 days ago with concerns for weakness and confusion.  She missed her dialysis session prior to admission due to weakness.  There was no report of fevers, chills, GI symptoms. ? ?Approximately 10 days prior to admission, patient also reported to the ER for left-sided earache and was found to have external otitis media and possible mastoiditis.  At that time she was already on Omnicef and neomycin drops.  She underwent CT scan which showed otomastoiditis and external otitis.  This was discussed with ENT who recommended to continue Omnicef and switch her to Ciprodex drops.  However, she came back to the hospital as noted above. ? ?She was seen by ENT during this admission.  It was  felt that after review of her imaging she had possible malignant otitis externa with no clinical evidence of mastoiditis.  They recommended 3 weeks of antibiotic coverage.  Her cultures grew Pseudomonas.  She was initially on cefepime from 5/3 through 5/6.  She subsequently developed confusion and altered mental status.  Neurology was consulted on 5/6.  They had concern for cefepime induced neurotoxicity and recommended  to change to another agent.  As a result, she has been on Zosyn since that time and her encephalopathy has slowly started to improve.  We have been consulted for further antibiotic recommendations. ? ? ?Past Medical History:  ?Diagnosis Date  ? Acute on chronic respiratory failure with hypoxia ([place]) 10/10/2016  ? Anxiety   ? Arthritis   ? AVM (arteriovenous malformation) of colon   ? CAD (coronary artery disease)   ? a. s/p CABG in 2013 b. DES to D1 in 10/2016. c. cath in 07/2018 showing patent grafts with occlusion of D1 at prior stent site and progression of PDA disease --> medical management recommended  ? Carotid artery disease ([place])   ? a. 52-77% LICA, 02/2422   ? Chronic anemia   ? Chronic bronchitis ([place])   ? Chronic diastolic CHF (congestive heart failure) ([place])   ? a. 02/2012 Echo EF 60-65%, nl wall motion, Gr 1 DD, mod MR  ? Colon cancer ([place]) 1992  ? Esophageal stricture   ? ESRD on hemodialysis ([place])   ? ESRD due to HTN, started dialysis 2011 and gets HD at [facility] with Dr Hinda Lenis on MWF schedule.  Access is LUA AVF as of Sept 2014.   ? GERD (gastroesophageal reflux disease)   ? High cholesterol 12/2011  ? History of blood transfusion 07/2011; 12/2011; 01/2012 X 2; 04/2012  ? History of gout   ? History of lower GI bleeding   ? Hypertension   ? Iron deficiency anemia   ? Jugular vein occlusion, right ([place])   ? Mitral regurgitation   ? a. Moderate by echo, 02/2012  ? Myocardial infarction [facility])   ? NSVT (nonsustained ventricular tachycardia) ([place])   ? Ovarian cancer ([place]) 1992  ? PAF (paroxysmal atrial fibrillation) ([place])   ? Pneumonia ~ 2009  ? PUD (peptic ulcer disease)   ? TIA (transient ischemic attack)   ? ? ?Social History  ? ?Tobacco Use  ? Smoking status: Never  ? Smokeless tobacco: Never  ?Vaping Use  ? Vaping Use: Never used  ?Substance Use Topics  ? Alcohol use: No  ?  Alcohol/week: 0.0 standard drinks  ? Drug use: No  ? ? ?Family History  ?Problem Relation Age of Onset  ? Heart disease Mother    ?     Heart Disease before age 65  ? Hyperlipidemia Mother   ? Hypertension Mother   ? Diabetes Mother   ? Heart attack Mother   ? Heart disease Father   ?     Heart Disease before age 25  ? Hyperlipidemia Father   ? Hypertension Father   ? Diabetes Father   ? Diabetes Sister   ? Hypertension Sister   ? Diabetes Brother   ? Hyperlipidemia Brother   ? Heart attack Brother   ? Hypertension Sister   ? Heart attack Brother   ? Colon cancer Child 67  ? Other Other   ?     noncontributory for early CAD  ? Esophageal cancer Neg Hx   ? Liver disease Neg Hx   ?  Kidney disease Neg Hx   ? Colon polyps Neg Hx   ? ? ?Allergies  ?Allergen Reactions  ? Amlodipine Swelling  ? Aspirin Other (See Comments)  ?  High Doses Mess up her stomach; "makes my bowels have blood in them". ?Takes 81 mg EC Aspirin   ? Nitrofurantoin Hives  ? Penicillins Other (See Comments)  ?  11/2021: tolerated Zosyn x 1 wk (2017) ? ?SYNCOPE? , "makes me real weak when I take it; like I'll pass out" ? ?Has patient had a PCN reaction causing immediate rash, facial/tongue/throat swelling, SOB or lightheadedness with hypotension: Yes ?Has patient had a PCN reaction causing severe rash involving mucus membranes or skin necrosis: no ?Has patient had a PCN reaction that required hospitalization no ?Has patient had a PCN reaction occurring within the last 10 years: no ?If all of the above ?  ? Bactrim [Sulfamethoxazole-Trimethoprim] Rash  ? Contrast Media [Iodinated Contrast Media] Itching  ? Iron Itching and Other (See Comments)  ?  "they gave me iron in dialysis; had to give me Benadryl cause I had to have the iron" (05/02/2012)  ? Tylenol [Acetaminophen] Itching and Other (See Comments)  ?  Makes her feet on fire per pt  ? Gabapentin Other (See Comments)  ?  Unknown reaction  ? Iron Sucrose Other (See Comments)  ?  Unknown  ? Ranexa [Ranolazine] Other (See Comments)  ?  Myoclonus-hospitalized   ? Sucroferric Oxyhydroxide Other (See Comments)  ?  Unknown  ?  Dexilant [Dexlansoprazole] Other (See Comments)  ?  Upset stomach  ? Hydralazine Itching  ?  Has tolerated while inpatient ?Has tolerated while inpatient  ? Levaquin [Levofloxacin In D5w] Rash  ? Morphine And

## 2021-11-20 NOTE — Progress Notes (Signed)
?      ?                 PROGRESS NOTE ? ?      ?PATIENT DETAILS ?Name: Shawna [name] ?Age: 81 y.o. ?Sex: female ?Date of Birth: 1941-04-26 ?Admit Date: 11/08/2021 ?Admitting Physician Little Ishikawa, MD ?ZOX:[id number], [facility] ? ?Brief Summary: ?81 year old with PAF, CAD s/p CABG/PCI, ESRD on HD MWF-who had a known left ear infection-followed by ENT-was on topical antimicrobial eardrops (cultures positive for Pseudomonas)-presented with worsening encephalopathy.  Patient was subsequently admitted to the hospitalist service for further evaluation and treatment.  See below for further details. ? ?Significant events: ?5/1>> admit for evaluation of altered mental status. ? ?Significant studies: ?4/21>> CT temporal bone: Complete opacification of the left external auditory canal/left middle ear cavity, small focus of osseous erosion at the left mastoid air cells.  Findings favored to reflect acute posterior mastoiditis and external otitis. ?5/02>> MRI brain: No acute findings. ?5/6-5/7>> LTM EEG: No seizures. ? ?Significant microbiology data: ?5/1>> blood culture: Negative ?5/2>> left ear discharge culture: Pseudomonas ? ?Procedures: ?5/08>> cortrak placed ?5/11>>cortrak removed ? ?Consults: ?ENT, nephrology, neurology, cardiology, infectious disease. ? ?Subjective: ?Completely awake and alert-tolerated HD yesterday without any issues.  Remarkable improvement over the past few days. ? ?Objective: ?Vitals: ?Blood pressure (!) 132/41, pulse (!) 55, temperature 98.1 ?F (36.7 ?C), temperature source Axillary, resp. rate 20, height '5\' 1"'$  (1.549 m), weight 54 kg, SpO2 94 %.  ? ?Exam: ?Gen Exam:Alert awake-not in any distress ?HEENT:atraumatic, normocephalic ?Chest: B/L clear to auscultation anteriorly ?CVS:S1S2 regular ?Abdomen:soft non tender, non distended ?Extremities:no edema ?Neurology: Non focal ?Skin: no rash   ? ?Pertinent Labs/Radiology: ? ?  Latest Ref Rng & Units 11/19/2021  ?  8:30 AM 11/18/2021  ?  5:00  AM 11/17/2021  ?  6:12 AM  ?CBC  ?WBC 4.0 - 10.5 K/uL 10.1   6.7   6.6    ?Hemoglobin 12.0 - 15.0 g/dL 10.2   10.3   9.3    ?Hematocrit 36.0 - 46.0 % 31.5   32.5   28.8    ?Platelets 150 - 400 K/uL 246   PLATELET CLUMPS NOTED ON SMEAR, UNABLE TO ESTIMATE   PLATELET CLUMPS NOTED ON SMEAR, UNABLE TO ESTIMATE    ?  ?Lab Results  ?Component Value Date  ? NA 136 11/20/2021  ? K 4.4 11/20/2021  ? CL 96 (L) 11/20/2021  ? CO2 25 11/20/2021  ? ?  ?Assessment/Plan: ?Acute metabolic encephalopathy: Felt to be multifactorial-due to malignant otitis externa/mastoiditis, cefepime induced neurotoxicity, possible side effect of gabapentin/Xanax/narcotics.  Neuroimaging negative for acute abnormalities-EEG negative for seizures.  Remarkable improvement in mental status over the past few days-much more awake and alert today compared to yesterday.  Continue supportive care. ? ?Malignant otitis externa due to Pseudomonas: Continue IV Zosyn/TobraDex drops.  Recommendations are for  antibiotics x3 weeks.  Avoiding fluoroquinolones given prolonged QTc/already on amiodarone-cephalosporins reportedly have caused neurotoxicity when she first presented.  Difficult situation-have consulted ID for further recommendations.  ? ?ESRD on HD MWF: Nephrology following. ? ?Normocytic anemia: S/p PRBC transfusion on 5/3-hemoglobin stable.  Continue Aranesp ? ?PAF with RVR: Maintaining sinus rhythm-on amiodarone-not on anticoagulation due to GI bleeding.  Recommendations are for amiodarone 400 mg twice daily for 2 weeks, then decrease to 200 mg daily. ? ?CAD s/p CABG: No anginal symptoms-continue statin/beta-blocker. ? ?Acute on chronic HFrEF (EF 30-35% on 07/16/2021): Improved-no longer short of breath-developed some mild shortness of  breath on 5/12.  Volume removal with HD. ? ?HTN: BP stable-on metoprolol. ? ?History of colon cancer/ovarian cancer: Completed previous therapy-follow-up with oncology in the outpatient setting. ? ?Nutrition  Status: ?Nutrition Problem: Moderate Malnutrition ?Etiology: acute illness, chronic illness (acute enncephalopathy with refusal to take po, ESRD/HD) ?Signs/Symptoms: mild fat depletion, moderate fat depletion ?Interventions: MVI, Refer to RD note for recommendations ? ?BMI: ?Estimated body mass index is 22.49 kg/m? as calculated from the following: ?  Height as of this encounter: '5\' 1"'$  (1.549 m). ?  Weight as of this encounter: 54 kg.  ? ?Code status: ?  Code Status: Full Code  ? ?DVT Prophylaxis: ?heparin injection 5,000 Units Start: 11/11/21 1400 ?Place and maintain sequential compression device Start: 11/09/21 0355 ?  ?Family Communication: Spouse-Nathaniel -[phone] on 5/11 ? ? ?Disposition Plan: ?Status is: Inpatient ?Remains inpatient appropriate because: Resolving encephalopathy-we will attempt to discontinue NG tube today-not yet stable for discharge. ?  ?Planned Discharge Destination:Skilled nursing facility ? ? ?Diet: ?Diet Order   ? ?       ?  Diet regular Room service appropriate? Yes; Fluid consistency: Thin  Diet effective now       ?  ? ?  ?  ? ?  ?  ? ? ?Antimicrobial agents: ?Anti-infectives (From admission, onward)  ? ? Start     Dose/Rate Route Frequency Ordered Stop  ? 11/15/21 1200  ceFEPIme (MAXIPIME) 2 g in sodium chloride 0.9 % 100 mL IVPB  Status:  Discontinued       ? 2 g ?200 mL/hr over 30 Minutes Intravenous Every M-W-F (Hemodialysis) 11/13/21 1328 11/14/21 0717  ? 11/15/21 1200  cefTAZidime (FORTAZ) 2 g in sodium chloride 0.9 % 100 mL IVPB  Status:  Discontinued       ? 2 g ?200 mL/hr over 30 Minutes Intravenous Every M-W-F (Hemodialysis) 11/14/21 1252 11/14/21 1613  ? 11/14/21 1900  cefTAZidime (FORTAZ) 1 g in sodium chloride 0.9 % 100 mL IVPB  Status:  Discontinued       ? 1 g ?200 mL/hr over 30 Minutes Intravenous  Once 11/14/21 1252 11/14/21 1613  ? 11/14/21 1800  piperacillin-tazobactam (ZOSYN) IVPB 2.25 g       ? 2.25 g ?100 mL/hr over 30 Minutes Intravenous Every 8  hours 11/14/21 1639 12/02/21 0359  ? 11/11/21 1800  ceFEPIme (MAXIPIME) 1 g in sodium chloride 0.9 % 100 mL IVPB  Status:  Discontinued       ? 1 g ?200 mL/hr over 30 Minutes Intravenous Daily 11/10/21 0919 11/14/21 0717  ? 11/10/21 1800  ceFEPIme (MAXIPIME) 500 mg in dextrose 5 % 50 mL IVPB       ? 500 mg ?110 mL/hr over 30 Minutes Intravenous  Once 11/10/21 0354 11/10/21 1752  ? 11/10/21 1600  ceFEPIme (MAXIPIME) 1 g in sodium chloride 0.9 % 100 mL IVPB  Status:  Discontinued       ? 1 g ?200 mL/hr over 30 Minutes Intravenous Daily 11/10/21 0911 11/10/21 0919  ? 11/10/21 1015  ceFEPIme (MAXIPIME) 1 g in sodium chloride 0.9 % 100 mL IVPB       ? 1 g ?200 mL/hr over 30 Minutes Intravenous  Once 11/10/21 0916 11/10/21 1052  ? 11/09/21 1800  cefTRIAXone (ROCEPHIN) 2 g in sodium chloride 0.9 % 100 mL IVPB  Status:  Discontinued       ? 2 g ?200 mL/hr over 30 Minutes Intravenous Every 24 hours 11/08/21 2151 11/10/21 0905  ? 11/08/21  2000  cefTRIAXone (ROCEPHIN) 2 g in sodium chloride 0.9 % 100 mL IVPB       ? 2 g ?200 mL/hr over 30 Minutes Intravenous  Once 11/08/21 1947 11/08/21 2121  ? ?  ? ? ? ?MEDICATIONS: ?Scheduled Meds: ? Chlorhexidine Gluconate Cloth  6 each Topical Q0600  ? cinacalcet  30 mg Oral Q M,W,F-1800  ? cyanocobalamin  1,000 mcg Intramuscular Q0600  ? Followed by  ? [START ON 11/21/2021] cyanocobalamin  1,000 mcg Intramuscular Weekly  ? darbepoetin (ARANESP) injection - DIALYSIS  100 mcg Intravenous Q Wed-HD  ? heparin injection (subcutaneous)  5,000 Units Subcutaneous Q8H  ? levothyroxine  25 mcg Per Tube Q0600  ? LORazepam  0.5 mg Intravenous QHS  ? metoprolol tartrate  25 mg Per Tube BID  ? multivitamin  1 tablet Per Tube Daily  ? rosuvastatin  10 mg Per Tube Daily  ? senna-docusate  1 tablet Per Tube BID  ? thiamine  100 mg Per NG tube Daily  ? tobramycin-dexamethasone  2 drop Left EAR Q6H  ? vitamin B-6  100 mg Oral Daily  ? vitamin E  400 Units Oral Daily  ? ?Continuous Infusions: ? sodium  chloride    ? sodium chloride    ? sodium chloride Stopped (11/12/21 1719)  ? diphenhydrAMINE 12.5 mg (11/14/21 2053)  ? piperacillin-tazobactam (ZOSYN)  IV 2.25 g (11/20/21 0432)  ? ?PRN Meds:.sodium chloride, sodium chloride,

## 2021-11-20 NOTE — Progress Notes (Signed)
?[facility] ?Progress Note  ? ?Subjective:  Seen in room. Answering questions but confused. BP appears improved s/p HD yesterday with 2.5L removed. Denies CP/dyspnea. ? ?Objective ?Vitals:  ? 11/19/21 2215 11/20/21 0000 11/20/21 0356 11/20/21 0733  ?BP: (!) 135/44 (!) 148/98 (!) 148/41 (!) 132/41  ?Pulse: 63 64 64 (!) 55  ?Resp:  '18 12 20  '$ ?Temp:  98.3 ?F (36.8 ?C) 98.5 ?F (36.9 ?C) 98.1 ?F (36.7 ?C)  ?TempSrc:  Oral Oral Axillary  ?SpO2:    94%  ?Weight:      ?Height:      ? ?Physical Exam ?General: Elderly female, NAD. Nasal O2 in place ?Heart: RRR, no murmurs, rubs or gallops ?Lungs: CTA bilaterally without wheezing, rhonchi or rales ?Abdomen: Soft, non-distended, +BS ?Extremities: No edema b/l lower extremities ?Dialysis Access: LUE AVF + bruit ? ?Additional Objective ?Labs: ?Basic Metabolic Panel: ?Recent Labs  ?Lab 11/18/21 ?0505 11/19/21 ?0830 11/20/21 ?0208  ?NA 137 136 136  ?K 4.3 4.7 4.4  ?CL 99 98 96*  ?CO2 '28 25 25  '$ ?GLUCOSE 150* 123* 97  ?BUN 28* 48* 29*  ?CREATININE 4.04* 6.43* 4.85*  ?CALCIUM 9.1 9.6 9.4  ?PHOS 2.7 3.8 4.0  ? ?Liver Function Tests: ?Recent Labs  ?Lab 11/18/21 ?0505 11/19/21 ?0830 11/20/21 ?0208  ?ALBUMIN 2.4* 2.5* 2.6*  ? ?CBC: ?Recent Labs  ?Lab 11/15/21 ?1306 11/16/21 ?0618 11/17/21 ?0612 11/18/21 ?0500 11/19/21 ?0830  ?WBC 9.4 6.1 6.6 6.7 10.1  ?HGB 9.1* 9.9* 9.3* 10.3* 10.2*  ?HCT 28.3* 31.2* 28.8* 32.5* 31.5*  ?MCV 101.4* 101.0* 101.1* 101.9* 100.0  ?PLT 248 PLATELET CLUMPS NOTED ON SMEAR, UNABLE TO ESTIMATE PLATELET CLUMPS NOTED ON SMEAR, UNABLE TO ESTIMATE PLATELET CLUMPS NOTED ON SMEAR, UNABLE TO ESTIMATE 246  ? ?Studies/Results: ?DG Chest Port 1V same Day ? ?Result Date: 11/19/2021 ?CLINICAL DATA:  A female at age 52 presents with shortness of breath and insomnia. EXAM: PORTABLE CHEST 1 VIEW COMPARISON:  Nov 08, 2021. FINDINGS: Post median sternotomy for CABG.  EKG leads project over the chest. Cardiomediastinal contours and hilar structures are stable with  moderate cardiac enlargement and signs of prior percutaneous coronary intervention as well. Blunting of LEFT costodiaphragmatic sulcus, slightly obscured LEFT hemidiaphragm with subtle airspace disease at the RIGHT lung base as well. No lobar consolidation. No pneumothorax.  Mild increased interstitial markings. On limited assessment no acute skeletal findings. IMPRESSION: Cardiomegaly with signs of CABG, constellation of findings most suggestive of mild CHF or volume overload with small LEFT and potential trace RIGHT effusion and associated airspace disease likely atelectasis. Difficult to exclude infection at the lung bases. Electronically Signed   By: Zetta Bills M.D.   On: 11/19/2021 07:51   ? ?Medications: ? sodium chloride    ? sodium chloride    ? sodium chloride Stopped (11/12/21 1719)  ? diphenhydrAMINE 12.5 mg (11/14/21 2053)  ? piperacillin-tazobactam (ZOSYN)  IV 2.25 g (11/20/21 0432)  ? ? Chlorhexidine Gluconate Cloth  6 each Topical Q0600  ? cinacalcet  30 mg Oral Q M,W,F-1800  ? cyanocobalamin  1,000 mcg Intramuscular Q0600  ? Followed by  ? [START ON 11/21/2021] cyanocobalamin  1,000 mcg Intramuscular Weekly  ? darbepoetin (ARANESP) injection - DIALYSIS  100 mcg Intravenous Q Wed-HD  ? heparin injection (subcutaneous)  5,000 Units Subcutaneous Q8H  ? levothyroxine  25 mcg Per Tube Q0600  ? LORazepam  0.5 mg Intravenous QHS  ? metoprolol tartrate  25 mg Per Tube BID  ? multivitamin  1 tablet  Per Tube Daily  ? rosuvastatin  10 mg Per Tube Daily  ? senna-docusate  1 tablet Per Tube BID  ? thiamine  100 mg Per NG tube Daily  ? tobramycin-dexamethasone  2 drop Left EAR Q6H  ? vitamin B-6  100 mg Oral Daily  ? vitamin E  400 Units Oral Daily  ? ? ?Dialysis Orders: ?[facility] MWF ?  3.5h 56.5kg   BFR 300   2/2.5 bath LUA AVF  Heparin none ? - venofer 50 mg q Monday ? - sensipar '30mg'$  PO q HD ? - calcitriol 1.31mg PO q HD ? - mircera 2092m q 2wks, last 4/25 ?- 5/01 lab showed HBsAg neg w/ Abs > 10/  protective ?  ?Assessment/Plan: ?AMS/weakness: multifactorial sepsis + polypharmacy vs cephalosporins. Has good HD compliance. Continue to hold gabapentin, oxycodone, and xanax. More alert each day. ?L ear mastoiditis: + pseudomonas L ear Cx, finishing course of Zosyn and antibiotic drops ?Atrial fib - w/ RVR here. Started on IV amio + metoprolol this admit -> now PO metop only. ?ESRD: Continue HD per usual MWF schedule -> next Monday 5/15. ?Hypokalemia - resolved, using 3K bath with HD ?BP/volume: Typically uses midodrine with HD - was normotensive now quite HTN. D/C'd midodrine  and increased UF goal with improvement.  Now below prior EDW.  ?Anemia of ESRD: Hgb 10.2. Getting Aranesp q Wed during admit. Transfused 2U PRBCs on 11/10/21.   ?Metabolic Bone Disease: CorrCa a little high, on sensipar. Phos stable, binder on hold. ?CAD (Hx CABG): on statin ?Hx of colon and ovarian cancer ? ?KaVeneta PentonPA-C ?11/20/2021, 10:06 AM  ?[facility] ? ? ? ?

## 2021-11-21 DIAGNOSIS — I10 Essential (primary) hypertension: Secondary | ICD-10-CM | POA: Diagnosis not present

## 2021-11-21 DIAGNOSIS — I5022 Chronic systolic (congestive) heart failure: Secondary | ICD-10-CM | POA: Diagnosis not present

## 2021-11-21 DIAGNOSIS — Z85038 Personal history of other malignant neoplasm of large intestine: Secondary | ICD-10-CM | POA: Diagnosis not present

## 2021-11-21 DIAGNOSIS — G9340 Encephalopathy, unspecified: Secondary | ICD-10-CM | POA: Diagnosis not present

## 2021-11-21 LAB — GLUCOSE, CAPILLARY
Glucose-Capillary: 120 mg/dL — ABNORMAL HIGH (ref 70–99)
Glucose-Capillary: 109 mg/dL — ABNORMAL HIGH (ref 70–99)
Glucose-Capillary: 147 mg/dL — ABNORMAL HIGH (ref 70–99)
Glucose-Capillary: 160 mg/dL — ABNORMAL HIGH (ref 70–99)
Glucose-Capillary: 140 mg/dL — ABNORMAL HIGH (ref 70–99)
Glucose-Capillary: 82 mg/dL (ref 70–99)

## 2021-11-21 NOTE — Progress Notes (Signed)
?      ?                 PROGRESS NOTE ? ?      ?PATIENT DETAILS ?Name: Shawna [name] ?Age: 81 y.o. ?Sex: female ?Date of Birth: 02-06-1941 ?Admit Date: 11/08/2021 ?Admitting Physician Little Ishikawa, MD ?[id number], [facility] ? ?Brief Summary: ?81 year old with PAF, CAD s/p CABG/PCI, ESRD on HD MWF-who had a known left ear infection-followed by ENT-was on topical antimicrobial eardrops (cultures positive for Pseudomonas)-presented with worsening encephalopathy.  Patient was subsequently admitted to the hospitalist service for further evaluation and treatment.  See below for further details. ? ?Significant events: ?5/1>> admit for evaluation of altered mental status. ? ?Significant studies: ?4/21>> CT temporal bone: Complete opacification of the left external auditory canal/left middle ear cavity, small focus of osseous erosion at the left mastoid air cells.  Findings favored to reflect acute posterior mastoiditis and external otitis. ?5/02>> MRI brain: No acute findings. ?5/6-5/7>> LTM EEG: No seizures. ? ?Significant microbiology data: ?5/1>> blood culture: Negative ?5/2>> left ear discharge culture: Pseudomonas ? ?Procedures: ?5/08>> cortrak placed ?5/11>>cortrak removed ? ?Consults: ?ENT, nephrology, neurology, cardiology, infectious disease. ? ?Subjective: ?Awake/alert-no chest pain or shortness of breath. ? ?Objective: ?Vitals: ?Blood pressure (!) 165/52, pulse 60, temperature 98.2 ?F (36.8 ?C), temperature source Oral, resp. rate 18, height '5\' 1"'$  (1.549 m), weight 55.1 kg, SpO2 92 %.  ? ?Exam: ?Gen Exam:Alert awake-not in any distress ?HEENT:atraumatic, normocephalic ?Chest: B/L clear to auscultation anteriorly ?CVS:S1S2 regular ?Abdomen:soft non tender, non distended ?Extremities:no edema ?Neurology: Non focal ?Skin: no rash  ? ?Pertinent Labs/Radiology: ? ?  Latest Ref Rng & Units 11/19/2021  ?  8:30 AM 11/18/2021  ?  5:00 AM 11/17/2021  ?  6:12 AM  ?CBC  ?WBC 4.0 - 10.5 K/uL 10.1   6.7   6.6     ?Hemoglobin 12.0 - 15.0 g/dL 10.2   10.3   9.3    ?Hematocrit 36.0 - 46.0 % 31.5   32.5   28.8    ?Platelets 150 - 400 K/uL 246   PLATELET CLUMPS NOTED ON SMEAR, UNABLE TO ESTIMATE   PLATELET CLUMPS NOTED ON SMEAR, UNABLE TO ESTIMATE    ?  ?Lab Results  ?Component Value Date  ? NA 136 11/20/2021  ? K 4.4 11/20/2021  ? CL 96 (L) 11/20/2021  ? CO2 25 11/20/2021  ? ?  ?Assessment/Plan: ?Acute metabolic encephalopathy: Felt to be multifactorial-due to malignant otitis externa/mastoiditis, cefepime induced neurotoxicity, possible side effect of gabapentin/Xanax/narcotics.  Neuroimaging negative for acute abnormalities-EEG negative for seizures.  Remarkable improvement over the past several days-she is completely awake/alert and back to her baseline.  ? ?Malignant otitis externa due to Pseudomonas: Clinically improved-remains on IV Zosyn/TobraDex eardrops.  ID now following-we will await further recommendations regarding discharge antibiotic regimen.  ? ?ESRD on HD MWF: Nephrology following. ? ?Normocytic anemia: S/p PRBC transfusion on 5/3-hemoglobin stable.  Continue Aranesp ? ?PAF with RVR: Maintaining sinus rhythm-on amiodarone-not on anticoagulation due to GI bleeding.  Recommendations are for amiodarone 400 mg twice daily for 2 weeks, then decrease to 200 mg daily. ? ?CAD s/p CABG: No anginal symptoms-continue statin/beta-blocker. ? ?Acute on chronic HFrEF (EF 30-35% on 07/16/2021): Compensated-volume removal with HD. ? ?HTN: BP stable-on metoprolol. ? ?History of colon cancer/ovarian cancer: Completed previous therapy-follow-up with oncology in the outpatient setting. ? ?Nutrition Status: ?Nutrition Problem: Moderate Malnutrition ?Etiology: acute illness, chronic illness (acute enncephalopathy with refusal to take po, ESRD/HD) ?Signs/Symptoms:  mild fat depletion, moderate fat depletion ?Interventions: MVI, Refer to RD note for recommendations ? ?BMI: ?Estimated body mass index is 22.95 kg/m? as calculated from the  following: ?  Height as of this encounter: '5\' 1"'$  (1.549 m). ?  Weight as of this encounter: 55.1 kg.  ? ?Code status: ?  Code Status: Full Code  ? ?DVT Prophylaxis: ?heparin injection 5,000 Units Start: 11/11/21 1400 ?Place and maintain sequential compression device Start: 11/09/21 0355 ?  ?Family Communication: Spouse-Nathaniel -[phone] 5/14 ? ? ?Disposition Plan: ?Status is: Inpatient ?Remains inpatient appropriate because: Resolving encephalopathy-we will attempt to discontinue NG tube today-not yet stable for discharge. ?  ?Planned Discharge Destination:Skilled nursing facility hopefully in the next few days. ? ? ?Diet: ?Diet Order   ? ?       ?  Diet regular Room service appropriate? Yes; Fluid consistency: Thin  Diet effective now       ?  ? ?  ?  ? ?  ?  ? ? ?Antimicrobial agents: ?Anti-infectives (From admission, onward)  ? ? Start     Dose/Rate Route Frequency Ordered Stop  ? 11/15/21 1200  ceFEPIme (MAXIPIME) 2 g in sodium chloride 0.9 % 100 mL IVPB  Status:  Discontinued       ? 2 g ?200 mL/hr over 30 Minutes Intravenous Every M-W-F (Hemodialysis) 11/13/21 1328 11/14/21 0717  ? 11/15/21 1200  cefTAZidime (FORTAZ) 2 g in sodium chloride 0.9 % 100 mL IVPB  Status:  Discontinued       ? 2 g ?200 mL/hr over 30 Minutes Intravenous Every M-W-F (Hemodialysis) 11/14/21 1252 11/14/21 1613  ? 11/14/21 1900  cefTAZidime (FORTAZ) 1 g in sodium chloride 0.9 % 100 mL IVPB  Status:  Discontinued       ? 1 g ?200 mL/hr over 30 Minutes Intravenous  Once 11/14/21 1252 11/14/21 1613  ? 11/14/21 1800  piperacillin-tazobactam (ZOSYN) IVPB 2.25 g       ? 2.25 g ?100 mL/hr over 30 Minutes Intravenous Every 8 hours 11/14/21 1639 12/02/21 0559  ? 11/11/21 1800  ceFEPIme (MAXIPIME) 1 g in sodium chloride 0.9 % 100 mL IVPB  Status:  Discontinued       ? 1 g ?200 mL/hr over 30 Minutes Intravenous Daily 11/10/21 0919 11/14/21 0717  ? 11/10/21 1800  ceFEPIme (MAXIPIME) 500 mg in dextrose 5 % 50 mL IVPB       ? 500 mg ?110  mL/hr over 30 Minutes Intravenous  Once 11/10/21 6834 11/10/21 1752  ? 11/10/21 1600  ceFEPIme (MAXIPIME) 1 g in sodium chloride 0.9 % 100 mL IVPB  Status:  Discontinued       ? 1 g ?200 mL/hr over 30 Minutes Intravenous Daily 11/10/21 0911 11/10/21 0919  ? 11/10/21 1015  ceFEPIme (MAXIPIME) 1 g in sodium chloride 0.9 % 100 mL IVPB       ? 1 g ?200 mL/hr over 30 Minutes Intravenous  Once 11/10/21 0916 11/10/21 1052  ? 11/09/21 1800  cefTRIAXone (ROCEPHIN) 2 g in sodium chloride 0.9 % 100 mL IVPB  Status:  Discontinued       ? 2 g ?200 mL/hr over 30 Minutes Intravenous Every 24 hours 11/08/21 2151 11/10/21 0905  ? 11/08/21 2000  cefTRIAXone (ROCEPHIN) 2 g in sodium chloride 0.9 % 100 mL IVPB       ? 2 g ?200 mL/hr over 30 Minutes Intravenous  Once 11/08/21 1947 11/08/21 2121  ? ?  ? ? ? ?MEDICATIONS: ?Scheduled  Meds: ? Chlorhexidine Gluconate Cloth  6 each Topical Q0600  ? cinacalcet  30 mg Oral Q M,W,F-1800  ? cyanocobalamin  1,000 mcg Intramuscular Weekly  ? darbepoetin (ARANESP) injection - DIALYSIS  100 mcg Intravenous Q Wed-HD  ? heparin injection (subcutaneous)  5,000 Units Subcutaneous Q8H  ? levothyroxine  25 mcg Per Tube Q0600  ? LORazepam  0.5 mg Intravenous QHS  ? metoprolol tartrate  25 mg Per Tube BID  ? multivitamin  1 tablet Per Tube Daily  ? rosuvastatin  10 mg Per Tube Daily  ? senna-docusate  1 tablet Per Tube BID  ? thiamine  100 mg Per NG tube Daily  ? tobramycin-dexamethasone  2 drop Left EAR Q6H  ? vitamin B-6  100 mg Oral Daily  ? vitamin E  400 Units Oral Daily  ? ?Continuous Infusions: ? sodium chloride    ? sodium chloride    ? sodium chloride Stopped (11/12/21 1719)  ? diphenhydrAMINE 12.5 mg (11/14/21 2053)  ? piperacillin-tazobactam (ZOSYN)  IV 2.25 g (11/21/21 2563)  ? ?PRN Meds:.sodium chloride, sodium chloride, sodium chloride, alteplase, diphenhydrAMINE, guaiFENesin-dextromethorphan, heparin, hydrALAZINE, hydrOXYzine, lidocaine (PF), lidocaine-prilocaine,  pentafluoroprop-tetrafluoroeth ? ? ?I have personally reviewed following labs and imaging studies ? ?LABORATORY DATA: ?CBC: ?Recent Labs  ?Lab 11/15/21 ?1306 11/16/21 ?0618 11/17/21 ?0612 11/18/21 ?0500 11/19/21 ?0830  ?WBC 9.4 6.1 6

## 2021-11-21 NOTE — Progress Notes (Signed)
?[facility] ?Progress Note  ? ?Subjective:  Seen in room - very chatty and appropriate this morning, much improved. Hoping for oatmeal for breakfast. No CP/dyspnea. Per ID note, will need 3 weeks of IV abx for malignant otitis externa. Ceftazidime can be given with HD as outpatient. ? ?Objective ?Vitals:  ? 11/20/21 2330 11/21/21 0305 11/21/21 0558 11/21/21 0748  ?BP: (!) 131/37 (!) 157/47  (!) 165/52  ?Pulse: 60 60    ?Resp: '18 20  18  '$ ?Temp: 98.1 ?F (36.7 ?C) 98.1 ?F (36.7 ?C)  98.2 ?F (36.8 ?C)  ?TempSrc: Oral Oral  Oral  ?SpO2: 95% 92%    ?Weight:   55.1 kg   ?Height:      ? ?Physical Exam ?General: Elderly female, NAD. Room air. Very chatty, back to baseline MS. ?Heart: RRR, no murmurs, rubs or gallops ?Lungs: CTA bilaterally without wheezing, rhonchi or rales ?Abdomen: Soft, non-distended, +BS ?Extremities: No edema b/l lower extremities ?Dialysis Access: LUE AVF + bruit ? ?Additional Objective ?Labs: ?Basic Metabolic Panel: ?Recent Labs  ?Lab 11/18/21 ?0505 11/19/21 ?0830 11/20/21 ?0208  ?NA 137 136 136  ?K 4.3 4.7 4.4  ?CL 99 98 96*  ?CO2 '28 25 25  '$ ?GLUCOSE 150* 123* 97  ?BUN 28* 48* 29*  ?CREATININE 4.04* 6.43* 4.85*  ?CALCIUM 9.1 9.6 9.4  ?PHOS 2.7 3.8 4.0  ? ?Liver Function Tests: ?Recent Labs  ?Lab 11/18/21 ?0505 11/19/21 ?0830 11/20/21 ?0208  ?ALBUMIN 2.4* 2.5* 2.6*  ? ?CBC: ?Recent Labs  ?Lab 11/15/21 ?1306 11/16/21 ?0618 11/17/21 ?0612 11/18/21 ?0500 11/19/21 ?0830  ?WBC 9.4 6.1 6.6 6.7 10.1  ?HGB 9.1* 9.9* 9.3* 10.3* 10.2*  ?HCT 28.3* 31.2* 28.8* 32.5* 31.5*  ?MCV 101.4* 101.0* 101.1* 101.9* 100.0  ?PLT 248 PLATELET CLUMPS NOTED ON SMEAR, UNABLE TO ESTIMATE PLATELET CLUMPS NOTED ON SMEAR, UNABLE TO ESTIMATE PLATELET CLUMPS NOTED ON SMEAR, UNABLE TO ESTIMATE 246  ? ?Blood Culture ?   ?Component Value Date/Time  ? SDES EAR 11/09/2021 1530  ? SPECREQUEST  11/09/2021 1530  ?  LEFT EAR ?Performed at [facility], [address], [place], [place] [address] ?  ? CULT RARE PSEUDOMONAS  AERUGINOSA 11/09/2021 1530  ? REPTSTATUS 11/11/2021 FINAL 11/09/2021 1530  ? ?Medications: ? sodium chloride    ? sodium chloride    ? sodium chloride Stopped (11/12/21 1719)  ? diphenhydrAMINE 12.5 mg (11/14/21 2053)  ? piperacillin-tazobactam (ZOSYN)  IV 2.25 g (11/21/21 0932)  ? ? Chlorhexidine Gluconate Cloth  6 each Topical Q0600  ? cinacalcet  30 mg Oral Q M,W,F-1800  ? cyanocobalamin  1,000 mcg Intramuscular Q0600  ? Followed by  ? cyanocobalamin  1,000 mcg Intramuscular Weekly  ? darbepoetin (ARANESP) injection - DIALYSIS  100 mcg Intravenous Q Wed-HD  ? heparin injection (subcutaneous)  5,000 Units Subcutaneous Q8H  ? levothyroxine  25 mcg Per Tube Q0600  ? LORazepam  0.5 mg Intravenous QHS  ? metoprolol tartrate  25 mg Per Tube BID  ? multivitamin  1 tablet Per Tube Daily  ? rosuvastatin  10 mg Per Tube Daily  ? senna-docusate  1 tablet Per Tube BID  ? thiamine  100 mg Per NG tube Daily  ? tobramycin-dexamethasone  2 drop Left EAR Q6H  ? vitamin B-6  100 mg Oral Daily  ? vitamin E  400 Units Oral Daily  ? ? ?Dialysis Orders: ?[name] ?  3.5h 56.5kg   BFR 300   2/2.5 bath LUA AVF  Heparin none ? -  venofer 50 mg q Monday ? - sensipar '30mg'$  PO q HD ? - calcitriol 1.66mg PO q HD ? - mircera 2077m q 2wks, last 4/25 ?- 5/01 lab showed HBsAg neg w/ Abs > 10/ protective ?  ?Assessment/Plan: ?AMS/weakness: multifactorial, sepsis + polypharmacy + Cefepime. Has good HD compliance. Continue to hold gabapentin, oxycodone, and xanax. Much better today. ?L ear mastoiditis: + pseudomonas L ear Cx, finishing course of Zosyn and antibiotic drops. Per ID will need 3 week course. Zosyn cannot be given with HD, but Ceftazidime can if appropriate. ?Atrial fib - w/ RVR here. Started on IV amio + metoprolol this admit -> now PO metop only. ?ESRD: Continue HD per usual MWF schedule -> next Monday 5/15. ?Hypokalemia - resolved, using 3K bath with HD ?BP/volume: Typically uses midodrine with HD - was normotensive, then  quite HTN. D/C'd midodrine and increased UF goal with improvement. Now below prior EDW - lower on discharge.  ?Anemia of ESRD: Hgb 10.2. Getting Aranesp q Wed during admit. Transfused 2U PRBCs on 11/10/21.   ?Metabolic Bone Disease: CorrCa a little high, on sensipar. Phos stable, binder on hold. ?CAD (Hx CABG): on statin ?Hx of colon and ovarian cancer ? ?KaVeneta PentonPA-C ?11/21/2021, 9:05 AM  ?[facility] ? ? ? ?

## 2021-11-22 DIAGNOSIS — G9340 Encephalopathy, unspecified: Secondary | ICD-10-CM | POA: Diagnosis not present

## 2021-11-22 DIAGNOSIS — I5022 Chronic systolic (congestive) heart failure: Secondary | ICD-10-CM | POA: Diagnosis not present

## 2021-11-22 DIAGNOSIS — I10 Essential (primary) hypertension: Secondary | ICD-10-CM | POA: Diagnosis not present

## 2021-11-22 DIAGNOSIS — Z85038 Personal history of other malignant neoplasm of large intestine: Secondary | ICD-10-CM | POA: Diagnosis not present

## 2021-11-22 LAB — CBC
HCT: 34.8 % — ABNORMAL LOW (ref 36.0–46.0)
Hemoglobin: 11.2 g/dL — ABNORMAL LOW (ref 12.0–15.0)
MCH: 33 pg (ref 26.0–34.0)
MCHC: 32.2 g/dL (ref 30.0–36.0)
MCV: 102.7 fL — ABNORMAL HIGH (ref 80.0–100.0)
Platelets: 238 10*3/uL (ref 150–400)
RBC: 3.39 MIL/uL — ABNORMAL LOW (ref 3.87–5.11)
RDW: 21.6 % — ABNORMAL HIGH (ref 11.5–15.5)
WBC: 5.8 10*3/uL (ref 4.0–10.5)
nRBC: 0 % (ref 0.0–0.2)

## 2021-11-22 LAB — BASIC METABOLIC PANEL
Anion gap: 13 (ref 5–15)
BUN: 16 mg/dL (ref 8–23)
CO2: 26 mmol/L (ref 22–32)
Calcium: 9.1 mg/dL (ref 8.9–10.3)
Chloride: 100 mmol/L (ref 98–111)
Creatinine, Ser: 3.96 mg/dL — ABNORMAL HIGH (ref 0.44–1.00)
GFR, Estimated: 11 mL/min — ABNORMAL LOW (ref >60)
Glucose, Bld: 155 mg/dL — ABNORMAL HIGH (ref 70–99)
Potassium: 4.3 mmol/L (ref 3.5–5.1)
Sodium: 139 mmol/L (ref 135–145)

## 2021-11-22 LAB — GLUCOSE, CAPILLARY
Glucose-Capillary: 89 mg/dL (ref 70–99)
Glucose-Capillary: 291 mg/dL — ABNORMAL HIGH (ref 70–99)
Glucose-Capillary: 131 mg/dL — ABNORMAL HIGH (ref 70–99)
Glucose-Capillary: 92 mg/dL (ref 70–99)

## 2021-11-22 MED ORDER — SODIUM CHLORIDE 0.9 % IV SOLN
2.0000 g | INTRAVENOUS | Status: DC
  Administered 2021-11-22: 2 g via INTRAVENOUS
  Filled 2021-11-22 (×2): qty 2

## 2021-11-22 NOTE — Progress Notes (Signed)
PHARMACY CONSULT NOTE FOR: ? ?OUTPATIENT  PARENTERAL ANTIBIOTIC THERAPY (OPAT) ? ?Indication: Malignant otitis externa ?Regimen: ceftazidime 2g IV MWF after hemodialysis  ?End date: Plan 3 weeks total from discharge date ? ?Ceftazidime will be administered at dialysis center. Plan communicated with nephrology. No formal OPAT orders will be placed.  ?  ?Thank you for allowing pharmacy to be a part of this patient's care. ? ?Pauletta Browns ?11/22/2021, 1:37 PM ? ?

## 2021-11-22 NOTE — Progress Notes (Signed)
Contacted by pharmacy staff and renal PA regarding whether pt's out-pt HD clinic can provide IV ceftazidime on MWF with HD. Spoke to [employer] at [employer]. Clinic should be able to obtain med and to order it today. Med should arrive to clinic tomorrow or Wednesday. Clinic to return call to navigator when medication arrives to clinic to confirm clinic can obtain and confirm medication at clinic to provide to pt. Will assist as needed.  ? ?Melven Sartorius ?Renal Navigator ?[phone] ?

## 2021-11-22 NOTE — Progress Notes (Signed)
Id brief note ? ?Clinically improving in regard to ams off cefepime ?On piptazo for malignant otitis externa ?Ent evaluated and no concern otomastoiditis ? ? ?-start ceftazidime here ?-if tolerate would dc on 3 weeks for otitis externa ?-discussed with primary team ?

## 2021-11-22 NOTE — TOC Progression Note (Signed)
Transition of Care (TOC) - Progression Note  ? ? ?Patient Details  ?Name: KATHERINA WIMER ?MRN: [id number] ?Date of Birth: [birth year] ? ?Transition of Care (TOC) CM/SW Contact  ?Coralee Pesa, LCSWA ?Phone Number: ?11/22/2021, 12:58 PM ? ?Clinical Narrative:    ?CSW was notified by MD that pt may be ready mid week to DC to [facility] rehab. CSW spoke with [facility] who stated they had previously gotten [place], but it is now expired. CSW advised facility to initiate auth for mid week DC. TOC will continue to follow for DC needs. ? ? ?Expected Discharge Plan: [place] ?Barriers to Discharge: Continued Medical Work up, Ship broker, SNF Pending bed offer ? ?Expected Discharge Plan and Services ?Expected Discharge Plan: [place] ?In-house Referral: Clinical Social Work ?  ?Post Acute Care Choice: Dialysis, Skilled Nursing Facility ?Living arrangements for the past 2 months: [place] ?Expected Discharge Date: 11/11/21               ?  ?  ?  ?  ?  ?  ?  ?  ?  ?  ? ? ?Social Determinants of Health (SDOH) Interventions ?  ? ?Readmission Risk Interventions ? ?  11/12/2021  ?  8:56 AM 10/20/2021  ?  4:38 PM 07/23/2021  ?  4:26 PM  ?Readmission Risk Prevention Plan  ?Transportation Screening Complete Complete Complete  ?Medication Review Press photographer) Complete Complete Complete  ?PCP or Specialist appointment within 3-5 days of discharge Complete Complete Complete  ?[place] or Home Care Consult Complete Complete Complete  ?SW Recovery Care/Counseling Consult Complete Complete Complete  ?Palliative Care Screening Not Applicable Not Applicable Not Applicable  ?Skilled Nursing Facility Complete Not Applicable Not Applicable  ? ? ?

## 2021-11-22 NOTE — Progress Notes (Signed)
?      ?                 PROGRESS NOTE ? ?      ?PATIENT DETAILS ?Name: Shawna [name] ?Age: 81 y.o. ?Sex: female ?Date of Birth: 1941/06/30 ?Admit Date: 11/08/2021 ?Admitting Physician Little Ishikawa, MD ?[id number], [facility] ? ?Brief Summary: ?81 year old with PAF, CAD s/p CABG/PCI, ESRD on HD MWF-who had a known left ear infection-followed by ENT-was on topical antimicrobial eardrops (cultures positive for Pseudomonas)-presented with worsening encephalopathy.  Patient was subsequently admitted to the hospitalist service for further evaluation and treatment.  See below for further details. ? ?Significant events: ?5/1>> admit for evaluation of altered mental status. ? ?Significant studies: ?4/21>> CT temporal bone: Complete opacification of the left external auditory canal/left middle ear cavity, small focus of osseous erosion at the left mastoid air cells.  Findings favored to reflect acute posterior mastoiditis and external otitis. ?5/02>> MRI brain: No acute findings. ?5/6-5/7>> LTM EEG: No seizures. ? ?Significant microbiology data: ?5/1>> blood culture: Negative ?5/2>> left ear discharge culture: Pseudomonas ? ?Procedures: ?5/08>> cortrak placed ?5/11>>cortrak removed ? ?Consults: ?ENT, nephrology, neurology, cardiology, infectious disease. ? ?Subjective: ?Seen earlier during HD-awake/alert.  No major issues overnight. ? ?Objective: ?Vitals: ?Blood pressure (!) 100/59, pulse 65, temperature 97.9 ?F (36.6 ?C), temperature source Oral, resp. rate (!) 22, height '5\' 1"'$  (1.549 m), weight 55.5 kg, SpO2 95 %.  ? ?Exam: ?Gen Exam:Alert awake-not in any distress ?HEENT:atraumatic, normocephalic ?Chest: B/L clear to auscultation anteriorly ?CVS:S1S2 regular ?Abdomen:soft non tender, non distended ?Extremities:no edema ?Neurology: Non focal ?Skin: no rash   ? ?Pertinent Labs/Radiology: ? ?  Latest Ref Rng & Units 11/19/2021  ?  8:30 AM 11/18/2021  ?  5:00 AM 11/17/2021  ?  6:12 AM  ?CBC  ?WBC 4.0 - 10.5 K/uL 10.1    6.7   6.6    ?Hemoglobin 12.0 - 15.0 g/dL 10.2   10.3   9.3    ?Hematocrit 36.0 - 46.0 % 31.5   32.5   28.8    ?Platelets 150 - 400 K/uL 246   PLATELET CLUMPS NOTED ON SMEAR, UNABLE TO ESTIMATE   PLATELET CLUMPS NOTED ON SMEAR, UNABLE TO ESTIMATE    ?  ?Lab Results  ?Component Value Date  ? NA 136 11/20/2021  ? K 4.4 11/20/2021  ? CL 96 (L) 11/20/2021  ? CO2 25 11/20/2021  ? ?  ?Assessment/Plan: ?Acute metabolic encephalopathy: Felt to be multifactorial-due to malignant otitis externa/mastoiditis, cefepime induced neurotoxicity, possible side effect of gabapentin/Xanax/narcotics.  Neuroimaging negative for acute abnormalities-EEG negative for seizures.  Remarkable improvement in mental status over the past few days-much more awake and alert today compared to yesterday.  Continue supportive care. ? ?Malignant otitis externa due to Pseudomonas: Continue IV Zosyn/TobraDex drops.  Recommendations are for  antibiotics x3 weeks.  Avoiding fluoroquinolones given prolonged QTc/already on amiodarone-cephalosporins reportedly have caused neurotoxicity when she first presented.  Awaiting ID input regarding discharge antibiotic regimen. ? ?ESRD on HD MWF: Nephrology following. ? ?Normocytic anemia: S/p PRBC transfusion on 5/3-hemoglobin stable.  Continue Aranesp ? ?PAF with RVR: Maintaining sinus rhythm-on amiodarone-not on anticoagulation due to GI bleeding.  Recommendations are for amiodarone 400 mg twice daily for 2 weeks, then decrease to 200 mg daily. ? ?CAD s/p CABG: No anginal symptoms-continue statin/beta-blocker. ? ?Acute on chronic HFrEF (EF 30-35% on 07/16/2021): Improved-no longer short of breath-developed some mild shortness of breath on 5/12.  Volume removal with HD. ? ?  HTN: BP stable-on metoprolol. ? ?History of colon cancer/ovarian cancer: Completed previous therapy-follow-up with oncology in the outpatient setting. ? ?Nutrition Status: ?Nutrition Problem: Moderate Malnutrition ?Etiology: acute illness, chronic  illness (acute enncephalopathy with refusal to take po, ESRD/HD) ?Signs/Symptoms: mild fat depletion, moderate fat depletion ?Interventions: MVI, Refer to RD note for recommendations ? ?BMI: ?Estimated body mass index is 23.12 kg/m? as calculated from the following: ?  Height as of this encounter: '5\' 1"'$  (1.549 m). ?  Weight as of this encounter: 55.5 kg.  ? ?Code status: ?  Code Status: Full Code  ? ?DVT Prophylaxis: ?heparin injection 5,000 Units Start: 11/11/21 1400 ?Place and maintain sequential compression device Start: 11/09/21 0355 ?  ?Family Communication: Spouse-Nathaniel [phone] on 5/14 ? ? ?Disposition Plan: ?Status is: Inpatient ?Remains inpatient appropriate because: Resolving encephalopathy-we will attempt to discontinue NG tube today-not yet stable for discharge. ?  ?Planned Discharge Destination:Skilled nursing facility ? ? ?Diet: ?Diet Order   ? ?       ?  Diet regular Room service appropriate? Yes; Fluid consistency: Thin  Diet effective now       ?  ? ?  ?  ? ?  ?  ? ? ?Antimicrobial agents: ?Anti-infectives (From admission, onward)  ? ? Start     Dose/Rate Route Frequency Ordered Stop  ? 11/15/21 1200  ceFEPIme (MAXIPIME) 2 g in sodium chloride 0.9 % 100 mL IVPB  Status:  Discontinued       ? 2 g ?200 mL/hr over 30 Minutes Intravenous Every M-W-F (Hemodialysis) 11/13/21 1328 11/14/21 0717  ? 11/15/21 1200  cefTAZidime (FORTAZ) 2 g in sodium chloride 0.9 % 100 mL IVPB  Status:  Discontinued       ? 2 g ?200 mL/hr over 30 Minutes Intravenous Every M-W-F (Hemodialysis) 11/14/21 1252 11/14/21 1613  ? 11/14/21 1900  cefTAZidime (FORTAZ) 1 g in sodium chloride 0.9 % 100 mL IVPB  Status:  Discontinued       ? 1 g ?200 mL/hr over 30 Minutes Intravenous  Once 11/14/21 1252 11/14/21 1613  ? 11/14/21 1800  piperacillin-tazobactam (ZOSYN) IVPB 2.25 g       ? 2.25 g ?100 mL/hr over 30 Minutes Intravenous Every 8 hours 11/14/21 1639 12/02/21 0559  ? 11/11/21 1800  ceFEPIme (MAXIPIME) 1 g in sodium  chloride 0.9 % 100 mL IVPB  Status:  Discontinued       ? 1 g ?200 mL/hr over 30 Minutes Intravenous Daily 11/10/21 0919 11/14/21 0717  ? 11/10/21 1800  ceFEPIme (MAXIPIME) 500 mg in dextrose 5 % 50 mL IVPB       ? 500 mg ?110 mL/hr over 30 Minutes Intravenous  Once 11/10/21 8850 11/10/21 1752  ? 11/10/21 1600  ceFEPIme (MAXIPIME) 1 g in sodium chloride 0.9 % 100 mL IVPB  Status:  Discontinued       ? 1 g ?200 mL/hr over 30 Minutes Intravenous Daily 11/10/21 0911 11/10/21 0919  ? 11/10/21 1015  ceFEPIme (MAXIPIME) 1 g in sodium chloride 0.9 % 100 mL IVPB       ? 1 g ?200 mL/hr over 30 Minutes Intravenous  Once 11/10/21 0916 11/10/21 1052  ? 11/09/21 1800  cefTRIAXone (ROCEPHIN) 2 g in sodium chloride 0.9 % 100 mL IVPB  Status:  Discontinued       ? 2 g ?200 mL/hr over 30 Minutes Intravenous Every 24 hours 11/08/21 2151 11/10/21 0905  ? 11/08/21 2000  cefTRIAXone (ROCEPHIN) 2 g in sodium chloride  0.9 % 100 mL IVPB       ? 2 g ?200 mL/hr over 30 Minutes Intravenous  Once 11/08/21 1947 11/08/21 2121  ? ?  ? ? ? ?MEDICATIONS: ?Scheduled Meds: ? Chlorhexidine Gluconate Cloth  6 each Topical Q0600  ? cinacalcet  30 mg Oral Q M,W,F-1800  ? cyanocobalamin  1,000 mcg Intramuscular Weekly  ? darbepoetin (ARANESP) injection - DIALYSIS  100 mcg Intravenous Q Wed-HD  ? heparin injection (subcutaneous)  5,000 Units Subcutaneous Q8H  ? levothyroxine  25 mcg Per Tube Q0600  ? LORazepam  0.5 mg Intravenous QHS  ? metoprolol tartrate  25 mg Per Tube BID  ? multivitamin  1 tablet Per Tube Daily  ? rosuvastatin  10 mg Per Tube Daily  ? senna-docusate  1 tablet Per Tube BID  ? tobramycin-dexamethasone  2 drop Left EAR Q6H  ? vitamin B-6  100 mg Oral Daily  ? vitamin E  400 Units Oral Daily  ? ?Continuous Infusions: ? sodium chloride    ? sodium chloride    ? sodium chloride Stopped (11/12/21 1719)  ? diphenhydrAMINE 12.5 mg (11/14/21 2053)  ? piperacillin-tazobactam (ZOSYN)  IV 2.25 g (11/22/21 0541)  ? ?PRN Meds:.sodium chloride,  sodium chloride, sodium chloride, alteplase, diphenhydrAMINE, guaiFENesin-dextromethorphan, heparin, hydrALAZINE, hydrOXYzine, lidocaine (PF), lidocaine-prilocaine, pentafluoroprop-tetrafluoroeth ? ? ?I

## 2021-11-22 NOTE — Progress Notes (Signed)
Physical Therapy Treatment ?Patient Details ?Name: Shawna [name] ?MRN: [id number] ?DOB: [birth year] ?Today's Date: 11/22/2021 ? ? ?History of Present Illness Pt is an 81 year old woman admitted on 11/09/21 with AMS and fever with symptoms of mastoiditis and otitis media. Brain MRI negative for acute changes Hospital course complicated by anemia, requiring transfusion.  EEG shows encephalopathy.   PMH: CHF, ESRD, GERD, HLD, HTN, chronic anemia, PAF, CAD s/p CABG. ? ?  ?PT Comments  ? ? Patient awake and feeding herself on arrival! Alert and following simple commands and able to progress to walking with min assist and RW. Continue to feel she can benefit from SNF for strengthening, gait training, and to reduce her risk of falls.  ?   ?Recommendations for follow up therapy are one component of a multi-disciplinary discharge planning process, led by the attending physician.  Recommendations may be updated based on patient status, additional functional criteria and insurance authorization. ? ?Follow Up Recommendations ? Skilled nursing-short term rehab (<3 hours/day) ?  ?  ?Assistance Recommended at Discharge Frequent or constant Supervision/Assistance  ?Patient can return home with the following Assistance with cooking/housework;Direct supervision/assist for medications management;Direct supervision/assist for financial management;Assist for transportation;Help with stairs or ramp for entrance;A little help with walking and/or transfers;A little help with bathing/dressing/bathroom ?  ?Equipment Recommendations ? None recommended by PT  ?  ?Recommendations for Other Services   ? ? ?  ?Precautions / Restrictions Precautions ?Precautions: Fall ?Restrictions ?Weight Bearing Restrictions: No  ?  ? ?Mobility ? Bed Mobility ?Overal bed mobility: Needs Assistance ?Bed Mobility: Supine to Sit, Sit to Supine ?  ?  ?Supine to sit: Min assist ?Sit to supine: Supervision ?  ?General bed mobility comments: pt alert and able to move legs off  EOB, became distracted and needed cues and assist to scoot out to EOB ?  ? ?Transfers ?Overall transfer level: Needs assistance ?Equipment used: Rolling walker (2 wheels) ?Transfers: Sit to/from Stand ?Sit to Stand: Min assist ?  ?  ?  ?  ?  ?General transfer comment: vc for hand placement ?  ? ?Ambulation/Gait ?Ambulation/Gait assistance: Min assist ?Gait Distance (Feet): 60 Feet ?Assistive device: Rolling walker (2 wheels) ?Gait Pattern/deviations: Step-through pattern, Decreased stride length, Trunk flexed ?  ?  ?  ?General Gait Details: RW slightly too tall for her at lowest height; she tends to push RW too far ahead and does not attend to steering around obstacles in the hall ? ? ?Stairs ?  ?  ?  ?  ?  ? ? ?Wheelchair Mobility ?  ? ?Modified Rankin (Stroke Patients Only) ?  ? ? ?  ?Balance Overall balance assessment: Needs assistance ?Sitting-balance support: Feet supported ?Sitting balance-Leahy Scale: Fair ?  ?  ?Standing balance support: Bilateral upper extremity supported ?Standing balance-Leahy Scale: Poor ?  ?  ?  ?  ?  ?  ?  ?  ?  ?  ?  ?  ?  ? ?  ?Cognition Arousal/Alertness: Awake/alert ?Behavior During Therapy: [facility] for tasks assessed/performed ?Overall Cognitive Status: No family/caregiver present to determine baseline cognitive functioning ?Area of Impairment: Orientation, Attention, Memory, Following commands, Awareness ?  ?  ?  ?  ?  ?  ?  ?  ?  ?  ?  ?  ?  ?Awareness: Intellectual ?  ?General Comments: Following simple commands with multi-modal cues ?  ?  ? ?  ?Exercises   ? ?  ?General Comments   ?  ?  ? ?  Pertinent Vitals/Pain Pain Assessment ?Pain Assessment: No/denies pain ?Faces Pain Scale: No hurt ?Breathing: normal ?Negative Vocalization: none ?Facial Expression: smiling or inexpressive ?Body Language: relaxed ?Consolability: no need to console ?PAINAD Score: 0  ? ? ?Home Living   ?  ?  ?  ?  ?  ?  ?  ?  ?  ?   ?  ?Prior Function    ?  ?  ?   ? ?PT Goals (current goals can now be found in  the care plan section) Acute Rehab PT Goals ?Patient Stated Goal: to get better ?Time For Goal Achievement: 11/25/21 ?Potential to Achieve Goals: Fair ?Progress towards PT goals: Progressing toward goals ? ?  ?Frequency ? ? ? Min 2X/week ? ? ? ?  ?PT Plan Current plan remains appropriate  ? ? ?Co-evaluation   ?  ?  ?  ?  ? ?  ?AM-PAC PT "6 Clicks" Mobility   ?Outcome Measure ? Help needed turning from your back to your side while in a flat bed without using bedrails?: A Little ?Help needed moving from lying on your back to sitting on the side of a flat bed without using bedrails?: A Little ?Help needed moving to and from a bed to a chair (including a wheelchair)?: A Little ?Help needed standing up from a chair using your arms (e.g., wheelchair or bedside chair)?: A Little ?Help needed to walk in hospital room?: A Little ?Help needed climbing 3-5 steps with a railing? : A Lot ?6 Click Score: 17 ? ?  ?End of Session Equipment Utilized During Treatment: Gait belt ?Activity Tolerance: Patient tolerated treatment well ?Patient left: in bed;with call bell/phone within reach;with bed alarm set ?  ?PT Visit Diagnosis: Other abnormalities of gait and mobility (R26.89);Muscle weakness (generalized) (M62.81) ?  ? ? ?Time: 6578-4696 ?PT Time Calculation (min) (ACUTE ONLY): 20 min ? ?Charges:  $Gait Training: 8-22 mins          ?          ? ? ?Arby Barrette, PT ?Acute Rehabilitation Services  ?Pager [phone] ?Office [phone] ? ? ? ?[name] ?11/22/2021, 2:37 PM ? ?

## 2021-11-22 NOTE — Progress Notes (Signed)
?[facility] ?Progress Note  ? ?Subjective:   Patient seen and examined at bedside during dialysis.  Tolerating well so far.  Only complaint is being cold.  Denies CP, SOB, abdominal pain and n/v/d.  ? ?Objective ?Vitals:  ? 11/21/21 2311 11/22/21 0327 11/22/21 0344 11/22/21 0350  ?BP: (!) 165/55 (!) 181/67  (!) 155/94  ?Pulse: 60 62    ?Resp: 16 18    ?Temp: 98 ?F (36.7 ?C) 98.3 ?F (36.8 ?C)    ?TempSrc: Oral Oral    ?SpO2:      ?Weight:   52.5 kg   ?Height:      ? ?Physical Exam ?General:well appearing, elderly female in NAD ?Heart:RRR, no mrg ?Lungs:CTAB, nml WOB ?Abdomen:soft, NTND ?Extremities:no LE edema ?Dialysis Access: LU AVF in use  ? ?Filed Weights  ? 11/19/21 1120 11/21/21 0558 11/22/21 0344  ?Weight: 54 kg 55.1 kg 52.5 kg  ? ?No intake or output data in the 24 hours ending 11/22/21 0828 ? ?Additional Objective ?Labs: ?Basic Metabolic Panel: ?Recent Labs  ?Lab 11/18/21 ?0505 11/19/21 ?0830 11/20/21 ?0208  ?NA 137 136 136  ?K 4.3 4.7 4.4  ?CL 99 98 96*  ?CO2 '28 25 25  '$ ?GLUCOSE 150* 123* 97  ?BUN 28* 48* 29*  ?CREATININE 4.04* 6.43* 4.85*  ?CALCIUM 9.1 9.6 9.4  ?PHOS 2.7 3.8 4.0  ? ?Liver Function Tests: ?Recent Labs  ?Lab 11/18/21 ?0505 11/19/21 ?0830 11/20/21 ?0208  ?ALBUMIN 2.4* 2.5* 2.6*  ? ? ?CBC: ?Recent Labs  ?Lab 11/15/21 ?1306 11/16/21 ?0618 11/17/21 ?0612 11/18/21 ?0500 11/19/21 ?0830  ?WBC 9.4 6.1 6.6 6.7 10.1  ?HGB 9.1* 9.9* 9.3* 10.3* 10.2*  ?HCT 28.3* 31.2* 28.8* 32.5* 31.5*  ?MCV 101.4* 101.0* 101.1* 101.9* 100.0  ?PLT 248 PLATELET CLUMPS NOTED ON SMEAR, UNABLE TO ESTIMATE PLATELET CLUMPS NOTED ON SMEAR, UNABLE TO ESTIMATE PLATELET CLUMPS NOTED ON SMEAR, UNABLE TO ESTIMATE 246  ? ?Medications: ? sodium chloride    ? sodium chloride    ? sodium chloride Stopped (11/12/21 1719)  ? diphenhydrAMINE 12.5 mg (11/14/21 2053)  ? piperacillin-tazobactam (ZOSYN)  IV 2.25 g (11/22/21 0541)  ? ? Chlorhexidine Gluconate Cloth  6 each Topical Q0600  ? cinacalcet  30 mg Oral Q M,W,F-1800  ?  cyanocobalamin  1,000 mcg Intramuscular Weekly  ? darbepoetin (ARANESP) injection - DIALYSIS  100 mcg Intravenous Q Wed-HD  ? heparin injection (subcutaneous)  5,000 Units Subcutaneous Q8H  ? levothyroxine  25 mcg Per Tube Q0600  ? LORazepam  0.5 mg Intravenous QHS  ? metoprolol tartrate  25 mg Per Tube BID  ? multivitamin  1 tablet Per Tube Daily  ? rosuvastatin  10 mg Per Tube Daily  ? senna-docusate  1 tablet Per Tube BID  ? thiamine  100 mg Per NG tube Daily  ? tobramycin-dexamethasone  2 drop Left EAR Q6H  ? vitamin B-6  100 mg Oral Daily  ? vitamin E  400 Units Oral Daily  ? ? ?Dialysis Orders: ?[facility] MWF ?  3.5h 56.5kg   BFR 300   2/2.5 bath LUA AVF  Heparin none ? - venofer 50 mg q Monday ? - sensipar '30mg'$  PO q HD ? - calcitriol 1.57mg PO q HD ? - mircera 208m q 2wks, last 4/25 ?- 5/01 lab showed HBsAg neg w/ Abs > 10/ protective ?  ?Assessment/Plan: ?AMS/weakness: Improved. multifactorial, sepsis + polypharmacy + Cefepime. Compliant with HD. Continue to hold gabapentin, oxycodone, and xanax.  ?L ear mastoiditis: + pseudomonas L  ear Cx, finishing course of Zosyn and antibiotic drops. Per ID will need 3 week course. Zosyn cannot be given with HD, but Ceftazidime can if appropriate.  ID/PMD determining appropriate ABX course for discharge.  ?Atrial fib - w/ RVR here. Started on IV amio + metoprolol this admit -> now PO metop only.  No anticoagulation d/t Hx GIB.  ?ESRD: Continue HD per usual MWF schedule -> next HD today. ?Hypokalemia - resolved, using 3K bath with HD ?BP/volume: Typically uses midodrine with HD - was normotensive, then quite HTN. D/C'd midodrine and increased UF goal with improvement. Now below prior EDW - lower on discharge.  ?Anemia of ESRD: last Hgb 10.2. Getting Aranesp q Wed during admit. Transfused 2U PRBCs on 11/10/21.   ?Metabolic Bone Disease: CorrCa a little high, on sensipar. Phos stable, binder on hold. ?CAD (Hx CABG): on statin ?Hx of colon and ovarian cancer ? ?Jen Mow, PA-C ?[facility] ?11/22/2021,8:28 AM ? LOS: 13 days  ? ? ?

## 2021-11-23 DIAGNOSIS — G9340 Encephalopathy, unspecified: Secondary | ICD-10-CM | POA: Diagnosis not present

## 2021-11-23 DIAGNOSIS — H6092 Unspecified otitis externa, left ear: Secondary | ICD-10-CM | POA: Diagnosis not present

## 2021-11-23 DIAGNOSIS — G9341 Metabolic encephalopathy: Secondary | ICD-10-CM | POA: Diagnosis not present

## 2021-11-23 DIAGNOSIS — I10 Essential (primary) hypertension: Secondary | ICD-10-CM | POA: Diagnosis not present

## 2021-11-23 DIAGNOSIS — Z85038 Personal history of other malignant neoplasm of large intestine: Secondary | ICD-10-CM | POA: Diagnosis not present

## 2021-11-23 DIAGNOSIS — I5022 Chronic systolic (congestive) heart failure: Secondary | ICD-10-CM | POA: Diagnosis not present

## 2021-11-23 DIAGNOSIS — Z992 Dependence on renal dialysis: Secondary | ICD-10-CM | POA: Diagnosis not present

## 2021-11-23 DIAGNOSIS — N186 End stage renal disease: Secondary | ICD-10-CM | POA: Diagnosis not present

## 2021-11-23 LAB — GLUCOSE, CAPILLARY
Glucose-Capillary: 132 mg/dL — ABNORMAL HIGH (ref 70–99)
Glucose-Capillary: 149 mg/dL — ABNORMAL HIGH (ref 70–99)
Glucose-Capillary: 123 mg/dL — ABNORMAL HIGH (ref 70–99)

## 2021-11-23 MED ORDER — AMIODARONE HCL 200 MG PO TABS
200.0000 mg | ORAL_TABLET | Freq: Every day | ORAL | Status: DC
Start: 2021-11-23 — End: 2021-11-25
  Administered 2021-11-24: 200 mg via ORAL
  Filled 2021-11-23 (×2): qty 1

## 2021-11-23 MED ORDER — PYRIDOXINE HCL 100 MG PO TABS: 100.0000 mg | ORAL_TABLET | Freq: Every day | ORAL | Status: DC

## 2021-11-23 MED ORDER — TOBRAMYCIN-DEXAMETHASONE 0.3-0.1 % OP SUSP: 2.0000 [drp] | Freq: Four times a day (QID) | OPHTHALMIC | 0 refills | Status: AC

## 2021-11-23 MED ORDER — COVID-19MRNA BIVAL VAC MODERNA 50 MCG/0.5ML IM SUSP
0.5000 mL | Freq: Once | INTRAMUSCULAR | Status: AC
  Administered 2021-11-23: 0.5 mL via INTRAMUSCULAR
  Filled 2021-11-23: qty 0.5

## 2021-11-23 MED ORDER — METOPROLOL TARTRATE 25 MG PO TABS: 25.0000 mg | ORAL_TABLET | Freq: Two times a day (BID) | ORAL | Status: DC

## 2021-11-23 MED ORDER — CYANOCOBALAMIN 1000 MCG/ML IJ SOLN: 1000.0000 ug | INTRAMUSCULAR | 0 refills | Status: AC

## 2021-11-23 MED ORDER — VITAMIN E 180 MG (400 UNIT) PO CAPS: 400.0000 [IU] | ORAL_CAPSULE | Freq: Every day | ORAL | Status: DC

## 2021-11-23 MED ORDER — SODIUM CHLORIDE 0.9 % IV SOLN: 2.0000 g | INTRAVENOUS | Status: AC

## 2021-11-23 MED ORDER — SODIUM CHLORIDE 0.9 % IV SOLN: 2.0000 g | INTRAVENOUS | Status: DC

## 2021-11-23 MED ORDER — AMIODARONE HCL 100 MG PO TABS: 200.0000 mg | ORAL_TABLET | Freq: Every day | ORAL | 2 refills | Status: DC

## 2021-11-23 NOTE — Progress Notes (Signed)
?   ? ? ? ? ?[facility] for [facility]ious Disease ? ?Date of Admission:  11/08/2021    ? ? ?Abx: ?5/15-c ceftaz ?5/07-15 piptazo ? ?5/03-07 cefepime ? ?ASSESSMENT: ?Left ear malignant otitis external ?Metabolic encephalopathy concerning for cefepime neurotoxicity ?Esrd on iHD ? ? ?Patient's mentation improved off cefepime ?Hear left ear otitis externa is much improved ?ENT had previously evaluated and had no concern for otomastoiditis ? ?3 weeks agree would be sufficient given continued clinical improvement ? ?PLAN: ?Finish ceftazidime on 5/24 to complete 3 weeks course; can be given tiw with iHD (post dialysis); 2 gram dose at each dialysis ?Ok to dc from id standpoint ?Will sign off ?Discussed with primary team ? ? ?I spent more than 35 minute reviewing data/chart, and coordinating care and >50% direct face to face time providing counseling/discussing diagnostics/treatment plan with patient ? ? ?Principal Problem: ?  Acute encephalopathy ?Active Problems: ?  Hx of CABG ?  Chronic systolic heart failure ([place]) ?  Essential hypertension ?  History of colon cancer ?  History of ovarian cancer ?  ESRD (end stage renal disease) on dialysis [facility]) ?  PAF (paroxysmal atrial fibrillation) ([place]) ?  Acute metabolic encephalopathy ? ? ?Allergies  ?Allergen Reactions  ? Amlodipine Swelling  ? Aspirin Other (See Comments)  ?  High Doses Mess up her stomach; "makes my bowels have blood in them". ?Takes 81 mg EC Aspirin   ? Nitrofurantoin Hives  ? Bactrim [Sulfamethoxazole-Trimethoprim] Rash  ? Contrast Media [Iodinated Contrast Media] Itching  ? Iron Itching and Other (See Comments)  ?  "they gave me iron in dialysis; had to give me Benadryl cause I had to have the iron" (05/02/2012)  ? Tylenol [Acetaminophen] Itching and Other (See Comments)  ?  Makes her feet on fire per pt  ? Gabapentin Other (See Comments)  ?  Unknown reaction  ? Iron Sucrose Other (See Comments)  ?  Unknown  ? Ranexa [Ranolazine] Other (See Comments)  ?   Myoclonus-hospitalized   ? Sucroferric Oxyhydroxide Other (See Comments)  ?  Unknown  ? Dexilant [Dexlansoprazole] Other (See Comments)  ?  Upset stomach  ? Hydralazine Itching  ?  Has tolerated while inpatient ?Has tolerated while inpatient  ? Levaquin [Levofloxacin In D5w] Rash  ? Morphine And Related Itching and Other (See Comments)  ?  Itching in feet  ? Pantoprazole Rash  ? Plavix [Clopidogrel Bisulfate] Rash  ? Protonix [Pantoprazole Sodium] Rash  ? Venofer [Ferric Oxide] Itching and Other (See Comments)  ?  Patient reports using Benadryl prior to doses as [place]  ? ? ?Scheduled Meds: ? amiodarone  200 mg Oral Daily  ? Chlorhexidine Gluconate Cloth  6 each Topical Q0600  ? cinacalcet  30 mg Oral Q M,W,F-1800  ? COVID-19 mRNA bivalent vaccine (Moderna)  0.5 mL Intramuscular Once  ? cyanocobalamin  1,000 mcg Intramuscular Weekly  ? heparin injection (subcutaneous)  5,000 Units Subcutaneous Q8H  ? levothyroxine  25 mcg Per Tube Q0600  ? LORazepam  0.5 mg Intravenous QHS  ? metoprolol tartrate  25 mg Per Tube BID  ? multivitamin  1 tablet Per Tube Daily  ? rosuvastatin  10 mg Per Tube Daily  ? senna-docusate  1 tablet Per Tube BID  ? tobramycin-dexamethasone  2 drop Left EAR Q6H  ? vitamin B-6  100 mg Oral Daily  ? vitamin E  400 Units Oral Daily  ? ?Continuous Infusions: ? sodium chloride    ?  sodium chloride    ? sodium chloride Stopped (11/12/21 1719)  ? cefTAZidime (FORTAZ)  IV 2 g (11/22/21 2150)  ? diphenhydrAMINE 12.5 mg (11/14/21 2053)  ? ?PRN Meds:.sodium chloride, sodium chloride, sodium chloride, alteplase, diphenhydrAMINE, guaiFENesin-dextromethorphan, heparin, hydrALAZINE, hydrOXYzine, lidocaine (PF), lidocaine-prilocaine, pentafluoroprop-tetrafluoroeth ? ? ?SUBJECTIVE: ?Some fullness in the left ear but no further discharge ?No f/c ?No n/v/diarrhea ?No rash ?Feels well ? ?Tolerated ceftazidime ? ?Review of Systems: ?ROS ?All other ROS was negative, except mentioned  above ? ? ? ? ?OBJECTIVE: ?Vitals:  ? 11/23/21 0318 11/23/21 0500 11/23/21 0808 11/23/21 1253  ?BP: (!) 161/50  (!) 158/34 (!) 153/71  ?Pulse: 60  (!) 57 (!) 57  ?Resp: '15  18 20  '$ ?Temp: 97.9 ?F (36.6 ?C)  97.9 ?F (36.6 ?C) 97.7 ?F (36.5 ?C)  ?TempSrc: Oral  Oral Oral  ?SpO2:      ?Weight:  56 kg    ?Height:      ? ?Body mass index is 23.33 kg/m?. ? ?Physical Exam ?General/constitutional: no distress, pleasant ?HEENT: Normocephalic, PER, left mastoid area nontender; left ear no discharge; no tenderness on movement of external ear ?CV: rrr no mrg ?Lungs: clear to auscultation, normal respiratory effort ?Abd: Soft, Nontender ?Ext: no edema ?Skin: no rash; left ue avf nontender ? ?Lab Results ?Lab Results  ?Component Value Date  ? WBC 5.8 11/22/2021  ? HGB 11.2 (L) 11/22/2021  ? HCT 34.8 (L) 11/22/2021  ? MCV 102.7 (H) 11/22/2021  ? PLT 238 11/22/2021  ?  ?Lab Results  ?Component Value Date  ? CREATININE 3.96 (H) 11/22/2021  ? BUN 16 11/22/2021  ? NA 139 11/22/2021  ? K 4.3 11/22/2021  ? CL 100 11/22/2021  ? CO2 26 11/22/2021  ?  ?Lab Results  ?Component Value Date  ? ALT 13 09/10/2021  ? AST 10 (L) 09/10/2021  ? ALKPHOS 134 (H) 09/10/2021  ? BILITOT 0.4 09/10/2021  ?  ? ? ?Microbiology: ?No results found for this or any previous visit (from the past 240 hour(s)). ? ? ?Serology: ? ? ?Imaging: ?If present, new imagings (plain films, ct scans, and mri) have been personally visualized and interpreted; radiology reports have been reviewed. Decision making incorporated into the Impression / Recommendations. ? ? ?Jabier Mutton, MD ?[facility] ?[place] ?[phone] pager    ?11/23/2021, 1:49 PM ? ?

## 2021-11-23 NOTE — Progress Notes (Signed)
?      ?                 PROGRESS NOTE ? ?      ?PATIENT DETAILS ?Name: Shawna [name] ?Age: 81 y.o. ?Sex: female ?Date of Birth: 1940/08/31 ?Admit Date: 11/08/2021 ?Admitting Physician Little Ishikawa, MD ?[id number], [facility] ? ?Brief Summary: ?81 year old with PAF, CAD s/p CABG/PCI, ESRD on HD MWF-who had a known left ear infection-followed by ENT-was on topical antimicrobial eardrops (cultures positive for Pseudomonas)-presented with worsening encephalopathy.  Patient was subsequently admitted to the hospitalist service for further evaluation and treatment.  See below for further details. ? ?Significant events: ?5/1>> admit for evaluation of altered mental status. ? ?Significant studies: ?4/21>> CT temporal bone: Complete opacification of the left external auditory canal/left middle ear cavity, small focus of osseous erosion at the left mastoid air cells.  Findings favored to reflect acute posterior mastoiditis and external otitis. ?5/02>> MRI brain: No acute findings. ?5/6-5/7>> LTM EEG: No seizures. ? ?Significant microbiology data: ?5/1>> blood culture: Negative ?5/2>> left ear discharge culture: Pseudomonas ? ?Procedures: ?5/08>> cortrak placed ?5/11>>cortrak removed ? ?Consults: ?ENT, nephrology, neurology, cardiology, infectious disease. ? ?Subjective: ?Completely awake and alert-no major issues overnight.  Tolerated ceftazidime yesterday without any issues. ? ?Objective: ?Vitals: ?Blood pressure (!) 158/34, pulse (!) 57, temperature 97.9 ?F (36.6 ?C), temperature source Oral, resp. rate 18, height '5\' 1"'$  (1.549 m), weight 56 kg, SpO2 95 %.  ? ?Exam: ?Gen Exam:Alert awake-not in any distress ?HEENT:atraumatic, normocephalic ?Chest: B/L clear to auscultation anteriorly ?CVS:S1S2 regular ?Abdomen:soft non tender, non distended ?Extremities:no edema ?Neurology: Non focal ?Skin: no rash  ? ?Pertinent Labs/Radiology: ? ?  Latest Ref Rng & Units 11/22/2021  ?  6:43 PM 11/19/2021  ?  8:30 AM 11/18/2021  ?   5:00 AM  ?CBC  ?WBC 4.0 - 10.5 K/uL 5.8   10.1   6.7    ?Hemoglobin 12.0 - 15.0 g/dL 11.2   10.2   10.3    ?Hematocrit 36.0 - 46.0 % 34.8   31.5   32.5    ?Platelets 150 - 400 K/uL 238   246   PLATELET CLUMPS NOTED ON SMEAR, UNABLE TO ESTIMATE    ?  ?Lab Results  ?Component Value Date  ? NA 139 11/22/2021  ? K 4.3 11/22/2021  ? CL 100 11/22/2021  ? CO2 26 11/22/2021  ? ?  ?Assessment/Plan: ?Acute metabolic encephalopathy: Felt to be multifactorial-due to malignant otitis externa/mastoiditis, cefepime induced neurotoxicity, possible side effect of gabapentin/Xanax/narcotics.  Neuroimaging negative for acute abnormalities-EEG negative for seizures.  Remarkable improvement in mental status over the past few days-completely awake and alert-back to usual baseline.  Thankfully tolerated ceftazidime yesterday without any issues. ? ?Malignant otitis externa due to Pseudomonas: She was initially on cefepime-but due to concern for neurotoxicity this was discontinued and she was started on Zosyn-she was also on probiotics drops.  ENT followed closely-recommendations are for antibiotics x3 weeks.  Unfortunately due to her being on amiodarone and prolonged QTc-fluoroquinolones were not an option.  She remains on Zosyn until 5/15-ID discussed with pharmacy and started her on ceftazidime.  No evidence of neurotoxicity-remains completely awake and alert.   ? ?ESRD on HD MWF: Nephrology following. ? ?Normocytic anemia: S/p PRBC transfusion on 5/3-hemoglobin stable.  Continue Aranesp ? ?PAF with RVR: Maintaining sinus rhythm-on amiodarone-not on anticoagulation due to GI bleeding.  Recommendations are for amiodarone 400 mg twice daily for 2 weeks, then decrease to 200 mg  daily. ? ?CAD s/p CABG: No anginal symptoms-continue statin/beta-blocker. ? ?Acute on chronic HFrEF (EF 30-35% on 07/16/2021): Improved-no longer short of breath-developed some mild shortness of breath on 5/12.  Volume removal with HD. ? ?HTN: BP stable-on  metoprolol. ? ?History of colon cancer/ovarian cancer: Completed previous therapy-follow-up with oncology in the outpatient setting. ? ?Nutrition Status: ?Nutrition Problem: Moderate Malnutrition ?Etiology: acute illness, chronic illness (acute enncephalopathy with refusal to take po, ESRD/HD) ?Signs/Symptoms: mild fat depletion, moderate fat depletion ?Interventions: MVI, Refer to RD note for recommendations ? ?BMI: ?Estimated body mass index is 23.33 kg/m? as calculated from the following: ?  Height as of this encounter: '5\' 1"'$  (1.549 m). ?  Weight as of this encounter: 56 kg.  ? ?Code status: ?  Code Status: Full Code  ? ?DVT Prophylaxis: ?heparin injection 5,000 Units Start: 11/11/21 1400 ?Place and maintain sequential compression device Start: 11/09/21 0355 ?  ?Family Communication: Spouse-Nathaniel -[phone] on 5/14 ? ? ?Disposition Plan: ?Status is: Inpatient ?Remains inpatient appropriate because: Awaiting SNF bed/insurance authorization. ?  ?Planned Discharge Destination:Skilled nursing facility ? ? ?Diet: ?Diet Order   ? ?       ?  Diet regular Room service appropriate? Yes; Fluid consistency: Thin  Diet effective now       ?  ? ?  ?  ? ?  ?  ? ? ?Antimicrobial agents: ?Anti-infectives (From admission, onward)  ? ? Start     Dose/Rate Route Frequency Ordered Stop  ? 11/22/21 2000  cefTAZidime (FORTAZ) 2 g in sodium chloride 0.9 % 100 mL IVPB       ? 2 g ?200 mL/hr over 30 Minutes Intravenous Every M-W-F (2000) 11/22/21 1307    ? 11/15/21 1200  ceFEPIme (MAXIPIME) 2 g in sodium chloride 0.9 % 100 mL IVPB  Status:  Discontinued       ? 2 g ?200 mL/hr over 30 Minutes Intravenous Every M-W-F (Hemodialysis) 11/13/21 1328 11/14/21 0717  ? 11/15/21 1200  cefTAZidime (FORTAZ) 2 g in sodium chloride 0.9 % 100 mL IVPB  Status:  Discontinued       ? 2 g ?200 mL/hr over 30 Minutes Intravenous Every M-W-F (Hemodialysis) 11/14/21 1252 11/14/21 1613  ? 11/14/21 1900  cefTAZidime (FORTAZ) 1 g in sodium chloride  0.9 % 100 mL IVPB  Status:  Discontinued       ? 1 g ?200 mL/hr over 30 Minutes Intravenous  Once 11/14/21 1252 11/14/21 1613  ? 11/14/21 1800  piperacillin-tazobactam (ZOSYN) IVPB 2.25 g  Status:  Discontinued       ? 2.25 g ?100 mL/hr over 30 Minutes Intravenous Every 8 hours 11/14/21 1639 11/22/21 1307  ? 11/11/21 1800  ceFEPIme (MAXIPIME) 1 g in sodium chloride 0.9 % 100 mL IVPB  Status:  Discontinued       ? 1 g ?200 mL/hr over 30 Minutes Intravenous Daily 11/10/21 0919 11/14/21 0717  ? 11/10/21 1800  ceFEPIme (MAXIPIME) 500 mg in dextrose 5 % 50 mL IVPB       ? 500 mg ?110 mL/hr over 30 Minutes Intravenous  Once 11/10/21 6314 11/10/21 1752  ? 11/10/21 1600  ceFEPIme (MAXIPIME) 1 g in sodium chloride 0.9 % 100 mL IVPB  Status:  Discontinued       ? 1 g ?200 mL/hr over 30 Minutes Intravenous Daily 11/10/21 0911 11/10/21 0919  ? 11/10/21 1015  ceFEPIme (MAXIPIME) 1 g in sodium chloride 0.9 % 100 mL IVPB       ?  1 g ?200 mL/hr over 30 Minutes Intravenous  Once 11/10/21 0916 11/10/21 1052  ? 11/09/21 1800  cefTRIAXone (ROCEPHIN) 2 g in sodium chloride 0.9 % 100 mL IVPB  Status:  Discontinued       ? 2 g ?200 mL/hr over 30 Minutes Intravenous Every 24 hours 11/08/21 2151 11/10/21 0905  ? 11/08/21 2000  cefTRIAXone (ROCEPHIN) 2 g in sodium chloride 0.9 % 100 mL IVPB       ? 2 g ?200 mL/hr over 30 Minutes Intravenous  Once 11/08/21 1947 11/08/21 2121  ? ?  ? ? ? ?MEDICATIONS: ?Scheduled Meds: ? Chlorhexidine Gluconate Cloth  6 each Topical Q0600  ? cinacalcet  30 mg Oral Q M,W,F-1800  ? cyanocobalamin  1,000 mcg Intramuscular Weekly  ? darbepoetin (ARANESP) injection - DIALYSIS  100 mcg Intravenous Q Wed-HD  ? heparin injection (subcutaneous)  5,000 Units Subcutaneous Q8H  ? levothyroxine  25 mcg Per Tube Q0600  ? LORazepam  0.5 mg Intravenous QHS  ? metoprolol tartrate  25 mg Per Tube BID  ? multivitamin  1 tablet Per Tube Daily  ? rosuvastatin  10 mg Per Tube Daily  ? senna-docusate  1 tablet Per Tube BID  ?  tobramycin-dexamethasone  2 drop Left EAR Q6H  ? vitamin B-6  100 mg Oral Daily  ? vitamin E  400 Units Oral Daily  ? ?Continuous Infusions: ? sodium chloride    ? sodium chloride    ? sodium chloride Stopped (11/12/21

## 2021-11-23 NOTE — Progress Notes (Signed)
Contacted [employer] and spoke to [place]. Clinic has ordered iv ceftazidime and it will hopefully be delivered to clinic tomorrow. Time of delivery is unknown at this time. This information provided to treatment team this afternoon. MD wants to confirm that clinic has abx prior to pt being d/c. Spoke to [place] at clinic to request that clinic contact navigator tomorrow when abx arrives. If no call is receive navigator will f/u with clinic tomorrow for update.  ? ?Melven Sartorius ?Renal Navigator ?[phone] ?

## 2021-11-23 NOTE — Discharge Summary (Addendum)
? ?PATIENT DETAILS ?Name: Shawna [name] ?Age: 81 y.o. ?Sex: female ?Date of Birth: 04/26/1941 ?MRN: [id number]. ?Admitting Physician: Little Ishikawa, MD ?WUJ:[id number], [facility] ? ?Admit Date: 11/08/2021 ?Discharge date: 11/24/2021 ? ?Recommendations for Outpatient Follow-up:  ?Follow up with PCP in 1-2 weeks ?Please obtain CMP/CBC in one week ?Ceftazidime with HD x-until 12/01/21 ? ?Admitted From:  ?Home ? ?Disposition: ?Skilled nursing facility ?  ?Discharge Condition: ?good ? ?CODE STATUS: ?  Code Status: Full Code  ? ?Diet recommendation:  ?Diet Order   ? ?       ?  Diet - low sodium heart healthy       ?  ?  Diet regular Room service appropriate? Yes; Fluid consistency: Thin  Diet effective now       ?  ? ?  ?  ? ?  ?  ? ?Brief Summary: ?81 year old with PAF, CAD s/p CABG/PCI, ESRD on HD MWF-who had a known left ear infection-followed by ENT-was on topical antimicrobial eardrops (cultures positive for Pseudomonas)-presented with worsening encephalopathy.  Patient was subsequently admitted to the hospitalist service for further evaluation and treatment.  See below for further details. ?  ?Significant events: ?5/1>> admit for evaluation of altered mental status. ?  ?Significant studies: ?4/21>> CT temporal bone: Complete opacification of the left external auditory canal/left middle ear cavity, small focus of osseous erosion at the left mastoid air cells.  Findings favored to reflect acute posterior mastoiditis and external otitis. ?5/02>> MRI brain: No acute findings. ?5/6-5/7>> LTM EEG: No seizures. ?  ?Significant microbiology data: ?5/1>> blood culture: Negative ?5/2>> left ear discharge culture: Pseudomonas ?  ?Procedures: ?5/08>> cortrak placed ?5/11>>cortrak removed ?  ?Consults: ?ENT, nephrology, neurology, cardiology, infectious disease. ? ?Brief Hospital Course: ?Acute metabolic encephalopathy: Felt to be multifactorial-due to malignant otitis externa/mastoiditis, cefepime induced neurotoxicity,  possible side effect of gabapentin/Xanax/narcotics.  Neuroimaging negative for acute abnormalities-EEG negative for seizures.  Remarkable improvement in mental status over the past few days-completely awake and alert-back to usual baseline.  Thankfully tolerated ceftazidime yesterday without any issues. ?  ?Malignant otitis externa due to Pseudomonas: She was initially on cefepime-but due to concern for neurotoxicity this was discontinued and she was started on Zosyn-she was also on Ciprodex ear drops.  ENT followed closely-recommendations are for antibiotics x3 weeks. Unfortunately due to her being on amiodarone and prolonged QTc-fluoroquinolones were not an option.  She remains on Zosyn until 5/15-ID discussed with pharmacy and started her on ceftazidime on 5/15-she seems to have tolerated this well without any major issues.  Recommendations are to continue ceftazidime this time 3 weeks .continue Ciprodex for 2 more days and then stop. ?  ?ESRD on HD MWF: Nephrology following. ?  ?Normocytic anemia: S/p PRBC transfusion on 5/3-hemoglobin stable.  Continue Aranesp ? ?PAF with RVR: Maintaining sinus rhythm-on amiodarone-not on anticoagulation due to GI bleeding.  Recommendations are for amiodarone 400 mg twice daily for 2 weeks, then decrease to 200 mg daily. ?  ?CAD s/p CABG: No anginal symptoms-continue statin/beta-blocker. ?  ?Acute on chronic HFrEF (EF 30-35% on 07/16/2021): Improved-no longer short of breath-developed some mild shortness of breath on 5/12.  Volume removal with HD. ?  ?HTN: BP stable-on metoprolol. ?  ?History of colon cancer/ovarian cancer: Completed previous therapy-follow-up with oncology in the outpatient setting. ? ?Vitamin B12/vitamin E/vitamin B6 deficiency: Being supplemented-please recheck in 3 months. ?  ?Nutrition Status: ?Nutrition Problem: Moderate Malnutrition ?Etiology: acute illness, chronic illness (acute enncephalopathy with refusal to take po, ESRD/HD) ?  Signs/Symptoms: mild fat  depletion, moderate fat depletion ?Interventions: MVI, Refer to RD note for recommendations ?  ?BMI: ?Estimated body mass index is 23.33 kg/m? as calculated from the following: ?  Height as of this encounter: '5\' 1"'$  (1.549 m). ?  Weight as of this encounter: 56 kg.  ?  ? ?Discharge Diagnoses:  ?Principal Problem: ?  Acute encephalopathy ?Active Problems: ?  Hx of CABG ?  Chronic systolic heart failure ([place]) ?  Essential hypertension ?  History of colon cancer ?  History of ovarian cancer ?  ESRD (end stage renal disease) on dialysis [facility]) ?  PAF (paroxysmal atrial fibrillation) ([place]) ?  Acute metabolic encephalopathy ?  Otitis externa of left ear ? ? ?Discharge Instructions: ? ?Activity:  ?As tolerated with Full fall precautions use walker/cane & assistance as needed ? ?Discharge Instructions   ? ? Call MD for:  difficulty breathing, headache or visual disturbances   Complete by: As directed ?  ? Diet - low sodium heart healthy   Complete by: As directed ?  ? Discharge instructions   Complete by: As directed ?  ? Follow with Primary MD  [facility] in 1-2 weeks ? ?Continue follow-up with your hemodialysis center on Monday Wednesday Friday. ? ?Please get a complete blood count and chemistry panel checked by your Primary MD at your next visit, and again as instructed by your Primary MD. ? ?Get Medicines reviewed and adjusted: ?Please take all your medications with you for your next visit with your Primary MD ? ?Laboratory/radiological data: ?Please request your Primary MD to go over all hospital tests and procedure/radiological results at the follow up, please ask your Primary MD to get all Hospital records sent to his/her office. ? ?In some cases, they will be blood work, cultures and biopsy results pending at the time of your discharge. Please request that your primary care M.D. follows up on these results. ? ?Also Note the following: ?If you experience worsening of your admission symptoms, develop  shortness of breath, life threatening emergency, suicidal or homicidal thoughts you must seek medical attention immediately by calling 911 or calling your MD immediately  if symptoms less severe. ? ?You must read complete instructions/literature along with all the possible adverse reactions/side effects for all the Medicines you take and that have been prescribed to you. Take any new Medicines after you have completely understood and accpet all the possible adverse reactions/side effects.  ? ?Do not drive when taking Pain medications or sleeping medications (Benzodaizepines) ? ?Do not take more than prescribed Pain, Sleep and Anxiety Medications. It is not advisable to combine anxiety,sleep and pain medications without talking with your primary care practitioner ? ?Special Instructions: If you have smoked or chewed Tobacco  in the last 2 yrs please stop smoking, stop any regular Alcohol  and or any Recreational drug use. ? ?Wear Seat belts while driving. ? ?Please note: ?You were cared for by a hospitalist during your hospital stay. Once you are discharged, your primary care physician will handle any further medical issues. Please note that NO REFILLS for any discharge medications will be authorized once you are discharged, as it is imperative that you return to your primary care physician (or establish a relationship with a primary care physician if you do not have one) for your post hospital discharge needs so that they can reassess your need for medications and monitor your lab values.  ? Increase activity slowly   Complete by: As directed ?  ?  No dressing needed   Complete by: As directed ?  ? ?  ? ?Allergies as of 11/24/2021   ? ?   Reactions  ? Amlodipine Swelling  ? Aspirin Other (See Comments)  ? High Doses Mess up her stomach; "makes my bowels have blood in them". ?Takes 81 mg EC Aspirin   ? Nitrofurantoin Hives  ? Bactrim [sulfamethoxazole-trimethoprim] Rash  ? Contrast Media [iodinated Contrast Media]  Itching  ? Iron Itching, Other (See Comments)  ? "they gave me iron in dialysis; had to give me Benadryl cause I had to have the iron" (05/02/2012)  ? Tylenol [acetaminophen] Itching, Other (See Comments)  ? M

## 2021-11-23 NOTE — TOC Progression Note (Addendum)
Transition of Care (TOC) - Progression Note  ? ? ?Patient Details  ?Name: Shawna [name] ?MRN: [id number] ?Date of Birth: [birth year] ? ?Transition of Care (TOC) CM/SW Contact  ?Benard Halsted, LCSW ?Phone Number: ?11/23/2021, 12:13 PM ? ?Clinical Narrative:    ?12pm-[facility] has received insurance approval for patient and are requesting hospital provide patient with bivalent booster if patient agreeable. CSW spoke with patient who is excited to be able to leave the hospital. She reported that her prior vaccines were fine so she is agreeable to the booster. CSW will make her spouse aware and confirm transportation. MD will order booster.  ? ?[facility] aware that patient will need to go to outpatient dialysis tomorrow. CSW alerted renal navigator of plan to confirm antibiotics.  ? ?2:45pm-CSW notified that OP HD clinic has not received the IV ceftazidime yet therefore MD will cancel discharge for today. CSW updated patient's spouse.  ? ? ?Expected Discharge Plan: [place] ?Barriers to Discharge: Barriers Resolved ? ?Expected Discharge Plan and Services ?Expected Discharge Plan: [place] ?In-house Referral: Clinical Social Work ?  ?Post Acute Care Choice: Dialysis, Skilled Nursing Facility ?Living arrangements for the past 2 months: [place] ?Expected Discharge Date: 11/11/21               ?  ?  ?  ?  ?  ?  ?  ?  ?  ?  ? ? ?Social Determinants of Health (SDOH) Interventions ?  ? ?Readmission Risk Interventions ? ?  11/12/2021  ?  8:56 AM 10/20/2021  ?  4:38 PM 07/23/2021  ?  4:26 PM  ?Readmission Risk Prevention Plan  ?Transportation Screening Complete Complete Complete  ?Medication Review Press photographer) Complete Complete Complete  ?PCP or Specialist appointment within 3-5 days of discharge Complete Complete Complete  ?[place] or Home Care Consult Complete Complete Complete  ?SW Recovery Care/Counseling Consult Complete Complete Complete  ?Palliative Care Screening Not Applicable Not  Applicable Not Applicable  ?Skilled Nursing Facility Complete Not Applicable Not Applicable  ? ? ?

## 2021-11-23 NOTE — Progress Notes (Addendum)
?[facility] ?Progress Note  ? ?Subjective:   Patient seen and examined at bedside.  Pleasantly confused this AM.  Oriented to person and year but not place.  Denies CP, SOB, abdominal pain and n/v/d.  ? ?Objective ?Vitals:  ? 11/22/21 2311 11/23/21 0318 11/23/21 0500 11/23/21 0808  ?BP: (!) 148/39 (!) 161/50  (!) 158/34  ?Pulse: 60 60  (!) 57  ?Resp: '20 15  18  '$ ?Temp: 98 ?F (36.7 ?C) 97.9 ?F (36.6 ?C)  97.9 ?F (36.6 ?C)  ?TempSrc: Oral Oral  Oral  ?SpO2: 95%     ?Weight:   56 kg   ?Height:      ? ?Physical Exam ?General:WDWN, elderly female in NAD ?Heart:RRR, no mrg ?Lungs:CTAB, nml WOB on RA ?Abdomen:soft, NTND ?Extremities:no LE edema ?Dialysis Access: LU AVF +b/t ? ?Filed Weights  ? 11/22/21 0344 11/22/21 0800 11/23/21 0500  ?Weight: 52.5 kg 55.5 kg 56 kg  ? ? ?Intake/Output Summary (Last 24 hours) at 11/23/2021 1148 ?Last data filed at 11/23/2021 1004 ?Gross per 24 hour  ?Intake 240 ml  ?Output --  ?Net 240 ml  ? ? ?Additional Objective ?Labs: ?Basic Metabolic Panel: ?Recent Labs  ?Lab 11/18/21 ?0505 11/19/21 ?0830 11/20/21 ?0208 11/22/21 ?1843  ?NA 137 136 136 139  ?K 4.3 4.7 4.4 4.3  ?CL 99 98 96* 100  ?CO2 '28 25 25 26  '$ ?GLUCOSE 150* 123* 97 155*  ?BUN 28* 48* 29* 16  ?CREATININE 4.04* 6.43* 4.85* 3.96*  ?CALCIUM 9.1 9.6 9.4 9.1  ?PHOS 2.7 3.8 4.0  --   ? ?Liver Function Tests: ?Recent Labs  ?Lab 11/18/21 ?0505 11/19/21 ?0830 11/20/21 ?0208  ?ALBUMIN 2.4* 2.5* 2.6*  ? ?CBC: ?Recent Labs  ?Lab 11/17/21 ?0612 11/18/21 ?0500 11/19/21 ?0830 11/22/21 ?1843  ?WBC 6.6 6.7 10.1 5.8  ?HGB 9.3* 10.3* 10.2* 11.2*  ?HCT 28.8* 32.5* 31.5* 34.8*  ?MCV 101.1* 101.9* 100.0 102.7*  ?PLT PLATELET CLUMPS NOTED ON SMEAR, UNABLE TO ESTIMATE PLATELET CLUMPS NOTED ON SMEAR, UNABLE TO ESTIMATE 246 238  ? ? ?Medications: ? sodium chloride    ? sodium chloride    ? sodium chloride Stopped (11/12/21 1719)  ? cefTAZidime (FORTAZ)  IV 2 g (11/22/21 2150)  ? diphenhydrAMINE 12.5 mg (11/14/21 2053)  ? ? Chlorhexidine  Gluconate Cloth  6 each Topical Q0600  ? cinacalcet  30 mg Oral Q M,W,F-1800  ? cyanocobalamin  1,000 mcg Intramuscular Weekly  ? darbepoetin (ARANESP) injection - DIALYSIS  100 mcg Intravenous Q Wed-HD  ? heparin injection (subcutaneous)  5,000 Units Subcutaneous Q8H  ? levothyroxine  25 mcg Per Tube Q0600  ? LORazepam  0.5 mg Intravenous QHS  ? metoprolol tartrate  25 mg Per Tube BID  ? multivitamin  1 tablet Per Tube Daily  ? rosuvastatin  10 mg Per Tube Daily  ? senna-docusate  1 tablet Per Tube BID  ? tobramycin-dexamethasone  2 drop Left EAR Q6H  ? vitamin B-6  100 mg Oral Daily  ? vitamin E  400 Units Oral Daily  ? ? ?Dialysis Orders: ?[facility] MWF ?  3.5h 56.5kg   BFR 300   2/2.5 bath LUA AVF  Heparin none ? - venofer 50 mg q Monday ? - sensipar '30mg'$  PO q HD ? - calcitriol 1.64mg PO q HD ? - mircera 2030m q 2wks, last 4/25 ?- 5/01 lab showed HBsAg neg w/ Abs > 10/ protective ?  ?Assessment/Plan: ?AMS/weakness: Intermittent - pleasantly confused this AM> multifactorial, sepsis + polypharmacy +  Cefepime. Compliant with HD. Continue to hold gabapentin, oxycodone, and xanax.  ?L ear mastoiditis: + pseudomonas L ear Cx, finishing course of Zosyn and antibiotic drops. Per ID will need 3 week course. Zosyn changed to ceftazidime so can be given with HD.  OP HD unit aware - Ceftazidime 2g IV qHD TIW x 3wks from d/c.  ?Atrial fib - w/ RVR here. Started on IV amio + metoprolol this admit -> now PO metop only.  No anticoagulation d/t Hx GIB.  ?ESRD: Continue HD per usual MWF schedule -> next HD tomorrow ?Hypokalemia - resolved, using 3K bath with HD ?BP/volume: Typically uses midodrine with HD - was normotensive, then quite HTN. D/C'd midodrine and increased UF goal with improvement. Now below prior EDW - lower on discharge. On metoprolol. ?Anemia of ESRD: Hgb 11.2 today, hold aranesp for now d/t Hgb>11. Transfused 2U PRBCs on 11/10/21.   ?Metabolic Bone Disease: CorrCa a little high, on sensipar. Phos stable,  binder on hold. ?CAD (Hx CABG): on statin ?Hx of colon and ovarian cancer ? ?Jen Mow, PA-C ?[facility] ?11/23/2021,11:48 AM ? LOS: 14 days  ? ? ?

## 2021-11-24 LAB — GLUCOSE, CAPILLARY
Glucose-Capillary: 124 mg/dL — ABNORMAL HIGH (ref 70–99)
Glucose-Capillary: 90 mg/dL (ref 70–99)

## 2021-11-24 MED ORDER — DARBEPOETIN ALFA 100 MCG/0.5ML IJ SOSY
PREFILLED_SYRINGE | INTRAMUSCULAR | Status: AC
  Filled 2021-11-24: qty 0.5

## 2021-11-24 NOTE — Plan of Care (Signed)
Patient hand off report given to [facility] from [place] at 1945. Transport team arrived and escorted the patient via stretcher with all her belongings. Discharged at 2000. ?

## 2021-11-24 NOTE — Progress Notes (Addendum)
Patient agitated and more confused. Patient yelling out and attempting to climb out of bed. When staff tries to help patient, she is attempting to swing at staff and screaming. Patient currently A&Ox1 when she was previously A&Ox2-3 earlier in the shift. VSS and patient does not appear to be in any other distress. MD notified of patient's changes. Will continue to monitor. ? ?0400- Patient reassessed. Patient much calmer and currently A&Ox2. Patient appears back to baseline.VSS and patient currently denies any complaints. Will continue to monitor patient. ?

## 2021-11-24 NOTE — Progress Notes (Signed)
OT Cancellation Note ? ?Patient Details ?Name: DAMARI HILTZ ?MRN: [id number] ?DOB: 18-Dec-1940 ? ? ?Cancelled Treatment:    Reason Eval/Treat Not Completed: Patient at procedure or test/ unavailable (HD) ? ?Malka So ?11/24/2021, 11:04 AM ?Nestor Lewandowsky, OTR/L ?Acute Rehabilitation Services ?Pager: [phone] ?Office: [phone]  ?

## 2021-11-24 NOTE — Progress Notes (Addendum)
Contacted [employer] and spoke to [place]. Pt's abx has not been delivered to the clinic as of yet. Joy to contact company that abx was ordered from on Monday to request an update on delivery. Clinic to return call to navigator with update later today. Update provided to attending, CSW, and renal PA.  ? ?Melven Sartorius ?Renal Navigator ?[phone] ? ?Addendum at 2:07 pm: ?Spoke to [place] at [facility]. Clinic is being told that pt's abx should arrive at clinic tomorrow. Caryl Asp states that she can borrow med from a sister clinic for pt's treatment on Friday and Monday and hopefully, pt's med will have arrived to clinic by then. This info was provided to attending, renal PA, and CSW. Plan is for pt to d/c to snf today and will resume at out-pt clinic on Friday. Spoke to [place] again at clinic to confirm that team is agreeable to that plan and pt will d/c today to snf. Joy aware pt will resume on Friday. D/C summary and last renal note faxed to clinic for continuation of care.  ?

## 2021-11-24 NOTE — TOC Transition Note (Signed)
Transition of Care (TOC) - CM/SW Discharge Note ? ? ?Patient Details  ?Name: Shawna [name] ?MRN: [id number] ?Date of Birth: 12/18/1940 ? ?Transition of Care (TOC) CM/SW Contact:  ?Benard Halsted, LCSW ?Phone Number: ?11/24/2021, 2:04 PM ? ? ?Clinical Narrative:    ?Patient will DC to: [facility] ?Anticipated DC date: 11/24/21 ?Family notified: Spouse ?Transport by: [name] ? ? ?Per MD patient ready for DC to [facility]. RN to call report prior to discharge [phone]). RN, patient, patient's family, and facility notified of DC. Discharge Summary and FL2 sent to facility. DC packet on chart. Ambulance transport requested for patient.  ? ?CSW will sign off for now as social work intervention is no longer needed. Please consult [place] again if new needs arise. ? ? ? ? ?Final next level of care: [place] ?Barriers to Discharge: Barriers Resolved ? ? ?Patient Goals and CMS Choice ?Patient states their goals for this hospitalization and ongoing recovery are:: Rehab ?CMS Medicare.gov Compare Post Acute Care list provided to:: Patient Represenative (must comment) ?Choice offered to / list presented to : Spouse ? ?Discharge Placement ?  ?Existing PASRR number confirmed : 11/24/21          ?Patient chooses bed at: [facility] ?Patient to be transferred to facility by: [facility] ?Name of family member notified: Spouse ?Patient and family notified of of transfer: 11/24/21 ? ?Discharge Plan and Services ?In-house Referral: Clinical Social Work ?  ?Post Acute Care Choice: Dialysis, Skilled Nursing Facility          ?  ?  ?  ?  ?  ?  ?  ?  ?  ?  ? ?Social Determinants of Health (SDOH) Interventions ?  ? ? ?Readmission Risk Interventions ? ?  11/12/2021  ?  8:56 AM 10/20/2021  ?  4:38 PM 07/23/2021  ?  4:26 PM  ?Readmission Risk Prevention Plan  ?Transportation Screening Complete Complete Complete  ?Medication Review Press photographer) Complete Complete Complete  ?PCP or Specialist appointment within 3-5 days of discharge  Complete Complete Complete  ?[place] or Home Care Consult Complete Complete Complete  ?SW Recovery Care/Counseling Consult Complete Complete Complete  ?Palliative Care Screening Not Applicable Not Applicable Not Applicable  ?Skilled Nursing Facility Complete Not Applicable Not Applicable  ? ? ? ? ? ?

## 2021-11-24 NOTE — Progress Notes (Signed)
?[facility] ?Progress Note  ? ?Subjective:   Patient seen and examined at bedside.  Just finished breakfast.  No specific complaints.  ? ?Objective ?Vitals:  ? 11/24/21 0813 11/24/21 0936 11/24/21 0944 11/24/21 1000  ?BP: (!) 162/48 (!) 111/42 (!) 132/43 (!) 152/40  ?Pulse: (!) 54 (!) 53 (!) 59 61  ?Resp: 20 11 (!) 24 (!) 23  ?Temp: 97.8 ?F (36.6 ?C) 97.6 ?F (36.4 ?C)    ?TempSrc: Oral Oral    ?SpO2:      ?Weight:  54.3 kg    ?Height:      ? ?Physical Exam ?General:WDWN female in NAD ?Heart:RRR, no mrg ?Lungs:CTAB, nml WOB on RA ?Abdomen:soft, NTND ?Extremities:no LE edema ?Dialysis Access: LU AVF +b/t  ? ?Filed Weights  ? 11/23/21 0500 11/24/21 0139 11/24/21 0936  ?Weight: 56 kg 56.3 kg 54.3 kg  ? ? ?Intake/Output Summary (Last 24 hours) at 11/24/2021 1027 ?Last data filed at 11/23/2021 1552 ?Gross per 24 hour  ?Intake 240 ml  ?Output --  ?Net 240 ml  ? ? ?Additional Objective ?Labs: ?Basic Metabolic Panel: ?Recent Labs  ?Lab 11/18/21 ?0505 11/19/21 ?0830 11/20/21 ?0208 11/22/21 ?1843  ?NA 137 136 136 139  ?K 4.3 4.7 4.4 4.3  ?CL 99 98 96* 100  ?CO2 '28 25 25 26  '$ ?GLUCOSE 150* 123* 97 155*  ?BUN 28* 48* 29* 16  ?CREATININE 4.04* 6.43* 4.85* 3.96*  ?CALCIUM 9.1 9.6 9.4 9.1  ?PHOS 2.7 3.8 4.0  --   ? ?Liver Function Tests: ?Recent Labs  ?Lab 11/18/21 ?0505 11/19/21 ?0830 11/20/21 ?0208  ?ALBUMIN 2.4* 2.5* 2.6*  ? ?CBC: ?Recent Labs  ?Lab 11/18/21 ?0500 11/19/21 ?0830 11/22/21 ?1843  ?WBC 6.7 10.1 5.8  ?HGB 10.3* 10.2* 11.2*  ?HCT 32.5* 31.5* 34.8*  ?MCV 101.9* 100.0 102.7*  ?PLT PLATELET CLUMPS NOTED ON SMEAR, UNABLE TO ESTIMATE 246 238  ? ?CBG: ?Recent Labs  ?Lab 11/22/21 ?2326 11/23/21 ?0321 11/23/21 ?1929 11/23/21 ?2312 11/24/21 ?0305  ?GLUCAP 92 132* 149* 123* 124*  ? ? ?Medications: ? sodium chloride    ? sodium chloride    ? sodium chloride Stopped (11/12/21 1719)  ? cefTAZidime (FORTAZ)  IV 2 g (11/22/21 2150)  ? diphenhydrAMINE 12.5 mg (11/14/21 2053)  ? ? amiodarone  200 mg Oral Daily  ?  Chlorhexidine Gluconate Cloth  6 each Topical Q0600  ? cinacalcet  30 mg Oral Q M,W,F-1800  ? cyanocobalamin  1,000 mcg Intramuscular Weekly  ? Darbepoetin Alfa      ? heparin injection (subcutaneous)  5,000 Units Subcutaneous Q8H  ? levothyroxine  25 mcg Per Tube Q0600  ? LORazepam  0.5 mg Intravenous QHS  ? metoprolol tartrate  25 mg Per Tube BID  ? multivitamin  1 tablet Per Tube Daily  ? rosuvastatin  10 mg Per Tube Daily  ? senna-docusate  1 tablet Per Tube BID  ? tobramycin-dexamethasone  2 drop Left EAR Q6H  ? vitamin B-6  100 mg Oral Daily  ? vitamin E  400 Units Oral Daily  ? ? ?Dialysis Orders: ?[facility] MWF ?  3.5h 56.5kg   BFR 300   2/2.5 bath LUA AVF  Heparin none ? - venofer 50 mg q Monday ? - sensipar '30mg'$  PO q HD ? - calcitriol 1.84mg PO q HD ? - mircera 2056m q 2wks, last 4/25 ?- 5/01 lab showed HBsAg neg w/ Abs > 10/ protective ?  ?Assessment/Plan: ?AMS/weakness: Intermittent - pleasantly confused this AM> multifactorial, sepsis + polypharmacy +  Cefepime. Compliant with HD. Continue to hold gabapentin, oxycodone, and xanax.  ?L ear mastoiditis: + pseudomonas L ear Cx, finishing course of Zosyn and antibiotic drops. Per ID will need 3 week course. Zosyn changed to ceftazidime so can be given with HD.  OP HD unit aware - Ceftazidime 2g IV qHD TIW x 3wks with end date of 5/24.   ?Atrial fib - w/ RVR here. Started on IV amio + metoprolol this admit -> now PO metop only.  No anticoagulation d/t Hx GIB.  ?ESRD: Continue HD per usual MWF schedule -> next HD today ?Hypokalemia - resolved, using 3K bath with HD ?BP/volume: Typically uses midodrine with HD - was normotensive, then quite HTN. D/C'd midodrine and increased UF goal with improvement. Now below prior EDW - lower on discharge ~53kg. On metoprolol. ?Anemia of ESRD: Hgb 11.2 today, hold aranesp for now d/t Hgb>11. Transfused 2U PRBCs on 11/10/21.   ?Metabolic Bone Disease: CorrCa a little high, on sensipar. Phos stable, binder on hold. ?CAD (Hx  CABG): on statin ?Hx of colon and ovarian cancer ?Dispo - ok for d/c from renal standpoint. To go to SNF after dialysis.  ? ?Jen Mow, PA-C ?[facility] ?11/24/2021,10:27 AM ? LOS: 15 days  ? ? ?

## 2021-11-24 NOTE — Progress Notes (Signed)
Calm and quiet this morning-following all my commands.  Understands that she will have hemodialysis and likely be discharged to SNF later today.  She is stable for discharge-see discharge summary for further details. ?

## 2021-11-24 NOTE — Progress Notes (Signed)
Occupational Therapy Treatment ?Patient Details ?Name: Shawna [name] ?MRN: [id number] ?DOB: [birth year] ?Today's Date: 11/24/2021 ? ? ?History of present illness Pt is an 81 year old woman admitted on 11/09/21 with AMS and fever with symptoms of mastoiditis and otitis media. Brain MRI negative for acute changes Hospital course complicated by anemia, requiring transfusion.  EEG shows encephalopathy.   PMH: CHF, ESRD, GERD, HLD, HTN, chronic anemia, PAF, CAD s/p CABG. ?  ?OT comments ? Pt readily willing to get OOB. Toileted with min guard assist, groomed with set up, donned front opening gown with st up and ate lunch independently. Left up in chair with needs met and chair alarm activated. Pt with improved cognition, somewhat slow to respond to commands, pleasant.   ? ?Recommendations for follow up therapy are one component of a multi-disciplinary discharge planning process, led by the attending physician.  Recommendations may be updated based on patient status, additional functional criteria and insurance authorization. ?   ?Follow Up Recommendations ? Skilled nursing-short term rehab (<3 hours/day)  ?  ?Assistance Recommended at Discharge Frequent or constant Supervision/Assistance  ?Patient can return home with the following ? A little help with bathing/dressing/bathroom;A little help with walking and/or transfers;Assistance with cooking/housework;Direct supervision/assist for medications management;Direct supervision/assist for financial management;Assist for transportation;Help with stairs or ramp for entrance ?  ?Equipment Recommendations ? Other (comment) (defer to next venue)  ?  ?Recommendations for Other Services   ? ?  ?Precautions / Restrictions Precautions ?Precautions: Fall  ? ? ?  ? ?Mobility Bed Mobility ?Overal bed mobility: Needs Assistance ?Bed Mobility: Supine to Sit ?  ?  ?  ?Sit to supine: Supervision ?  ?  ?  ? ?Transfers ?Overall transfer level: Needs assistance ?Equipment used: None ?Transfers: Sit  to/from Stand, Bed to chair/wheelchair/BSC ?Sit to Stand: Min guard ?  ?  ?Step pivot transfers: Min guard ?  ?  ?General transfer comment: bed to [facility] to chair ?  ?  ?Balance Overall balance assessment: Needs assistance ?  ?Sitting balance-Leahy Scale: Fair ?  ?  ?Standing balance support: No upper extremity supported ?Standing balance-Leahy Scale: Fair ?Standing balance comment: statically ?  ?  ?  ?  ?  ?  ?  ?  ?  ?  ?  ?   ? ?ADL either performed or assessed with clinical judgement  ? ?ADL Overall ADL's : Needs assistance/impaired ?  ?  ?Grooming: Wash/dry hands;Sitting;Set up ?  ?  ?  ?  ?  ?Upper Body Dressing : Set up;Sitting ?  ?  ?  ?Toilet Transfer: Min guard;Stand-pivot;BSC/3in1 ?  ?Toileting- Water quality scientist and Hygiene: Min guard;Sit to/from stand ?  ?  ?  ?  ?  ?  ? ?Extremity/Trunk Assessment   ?  ?  ?  ?  ?  ? ?Vision   ?  ?  ?Perception   ?  ?Praxis   ?  ? ?Cognition Arousal/Alertness: Awake/alert ?Behavior During Therapy: [facility] for tasks assessed/performed ?Overall Cognitive Status: No family/caregiver present to determine baseline cognitive functioning ?  ?  ?  ?  ?  ?  ?  ?  ?  ?  ?  ?  ?  ?  ?  ?Problem Solving: Slow processing, Decreased initiation, Requires verbal cues ?  ?  ?  ?   ?Exercises   ? ?  ?Shoulder Instructions   ? ? ?  ?General Comments    ? ? ?Pertinent Vitals/ Pain  Pain Assessment ?Pain Assessment: No/denies pain ? ?Home Living   ?  ?  ?  ?  ?  ?  ?  ?  ?  ?  ?  ?  ?  ?  ?  ?  ?  ?  ? ?  ?Prior Functioning/Environment    ?  ?  ?  ?   ? ?Frequency ? Min 2X/week  ? ? ? ? ?  ?Progress Toward Goals ? ?OT Goals(current goals can now be found in the care plan section) ? Progress towards OT goals: Progressing toward goals ? ?Acute Rehab OT Goals ?OT Goal Formulation: Patient unable to participate in goal setting ?Time For Goal Achievement: 11/30/21 ?Potential to Achieve Goals: Fair  ?Plan Discharge plan remains appropriate   ? ?Co-evaluation ? ? ?   ?  ?  ?  ?  ? ?  ?AM-PAC OT  "6 Clicks" Daily Activity     ?Outcome Measure ? ? Help from another person eating meals?: None ?Help from another person taking care of personal grooming?: A Little ?Help from another person toileting, which includes using toliet, bedpan, or urinal?: A Little ?Help from another person bathing (including washing, rinsing, drying)?: A Little ?Help from another person to put on and taking off regular upper body clothing?: A Little ?Help from another person to put on and taking off regular lower body clothing?: A Little ?6 Click Score: 19 ? ?  ?End of Session   ? ?OT Visit Diagnosis: Unsteadiness on feet (R26.81);Muscle weakness (generalized) (M62.81);Other symptoms and signs involving cognitive function ?  ?Activity Tolerance Patient tolerated treatment well ?  ?Patient Left in chair;with call bell/phone within reach;with chair alarm set ?  ?Nurse Communication   ?  ? ?   ? ?Time: 8867-7373 ?OT Time Calculation (min): 15 min ? ?Charges: OT General Charges ?$OT Visit: 1 Visit ?OT Treatments ?$Self Care/Home Management : 8-22 mins ? ?Nestor Lewandowsky, OTR/L ?Acute Rehabilitation Services ?Pager: [phone] ?Office: [phone]  ? ?Shawna [name] ?11/24/2021, 2:13 PM ? ? ?

## 2021-12-03 ENCOUNTER — Ambulatory Visit: Payer: Medicare HMO | Admitting: Cardiology

## 2021-12-03 NOTE — Progress Notes (Unsigned)
Clinical Summary Shawna [name] is a 81 y.o.female  seen today for follow up of the following medical problems.    1.CAD - history of CABG in 2013, along with DES to D1 in 10/2016 - cath in 07/2018 showing patent grafts with occlusion of D1 at prior stent site and progression of PDA disease --> medical management recommended)   - did not tolerate Ranexa and had hypotension with BB therapy in the past. - some chest pain every 2-3 days. Overall stable.      06/17/21 ER vsiit with chest pain - Trop 23-25 - EKG SR, LBBB with PVCs - she describes the pain was positional, worst with palpation in the ER.  - pain improves with NG.Takes NG 1-2 times per week.      2. Chronic diastolic HF - fluid managed with HD     3. HTN - compliant with meds, complicated by low bp's on HD.      4. Hyperlipidemia - 11/2020 TC 123 TG 87 LDL 48  - has both simva and rosuvastatin on her list, she is not sure what she is taking.      5. PAF - not on anticoagulation given history of GIB     - admit 04/2021 with SVT and PAF. - restarted on amiodarone. - occasional symptoms with laying down, infrequent.      6. NSVT - had been on amio according to notes - amio was stopped at 12/17/20 discharge in setting of elevated TSH - back on amio after 04/2021 admission     7. ESRD - occasional low bp's during HD         8. Hypothryoidism - followed by endocrine, she is on levothryxie  9. AMS - admitted 11/2021 with encephaolopathy due to malignant otitis externa/mastoiditis, cefepime induced neurotoxicity, possible side effect of gabapentin/Xanax/narcotics Past Medical History:  Diagnosis Date   Acute on chronic respiratory failure with hypoxia ([place]) 10/10/2016   Anxiety    Arthritis    AVM (arteriovenous malformation) of colon    CAD (coronary artery disease)    a. s/p CABG in 2013 b. DES to D1 in 10/2016. c. cath in 07/2018 showing patent grafts with occlusion of D1 at prior stent site and  progression of PDA disease --> medical management recommended   Carotid artery disease ([place])    a. 25-42% LICA, 01/622    Chronic anemia    Chronic bronchitis (HCC)    Chronic diastolic CHF (congestive heart failure) ([place])    a. 02/2012 Echo EF 60-65%, nl wall motion, Gr 1 DD, mod MR   Colon cancer ([place]) 1992   Esophageal stricture    ESRD on hemodialysis ([place])    ESRD due to HTN, started dialysis 2011 and gets HD at [facility] with Dr Hinda Lenis on MWF schedule.  Access is LUA AVF as of Sept 2014.    GERD (gastroesophageal reflux disease)    High cholesterol 12/2011   History of blood transfusion 07/2011; 12/2011; 01/2012 X 2; 04/2012   History of gout    History of lower GI bleeding    Hypertension    Iron deficiency anemia    Jugular vein occlusion, right (HCC)    Mitral regurgitation    a. Moderate by echo, 02/2012   Myocardial infarction [facility])    NSVT (nonsustained ventricular tachycardia) ([place])    Ovarian cancer ([place]) 1992   PAF (paroxysmal atrial fibrillation) ([place])    Pneumonia ~ 2009   PUD (peptic ulcer  disease)    TIA (transient ischemic attack)      Allergies  Allergen Reactions   Amlodipine Swelling   Aspirin Other (See Comments)    High Doses Mess up her stomach; "makes my bowels have blood in them". Takes 81 mg EC Aspirin    Nitrofurantoin Hives   Bactrim [Sulfamethoxazole-Trimethoprim] Rash   Contrast Media [Iodinated Contrast Media] Itching   Iron Itching and Other (See Comments)    "they gave me iron in dialysis; had to give me Benadryl cause I had to have the iron" (05/02/2012)   Tylenol [Acetaminophen] Itching and Other (See Comments)    Makes her feet on fire per pt   Gabapentin Other (See Comments)    Unknown reaction   Iron Sucrose Other (See Comments)    Unknown   Ranexa [Ranolazine] Other (See Comments)    Myoclonus-hospitalized    Sucroferric Oxyhydroxide Other (See Comments)    Unknown   Dexilant [Dexlansoprazole] Other (See Comments)    Upset  stomach   Hydralazine Itching    Has tolerated while inpatient Has tolerated while inpatient   Levaquin [Levofloxacin In D5w] Rash   Morphine And Related Itching and Other (See Comments)    Itching in feet   Pantoprazole Rash   Plavix [Clopidogrel Bisulfate] Rash   Protonix [Pantoprazole Sodium] Rash   Venofer [Ferric Oxide] Itching and Other (See Comments)    Patient reports using Benadryl prior to doses as [facility]     Current Outpatient Medications  Medication Sig Dispense Refill   amiodarone (PACERONE) 100 MG tablet Take 2 tablets (200 mg total) by mouth daily. 30 tablet 2   cyanocobalamin (,VITAMIN B-12,) 1000 MCG/ML injection Inject 1 mL (1,000 mcg total) into the muscle once a week for 4 doses. 1 mL 0   levothyroxine (SYNTHROID) 25 MCG tablet Take 1 tablet (25 mcg total) by mouth daily. (Patient not taking: Reported on 11/09/2021) 90 tablet 0   metoprolol tartrate (LOPRESSOR) 25 MG tablet Take 1 tablet (25 mg total) by mouth 2 (two) times daily.     multivitamin (RENA-VIT) TABS tablet Take 1 tablet by mouth daily.     omeprazole (PRILOSEC) 20 MG capsule Take 1 capsule (20 mg total) by mouth daily. (Patient taking differently: Take 40 mg by mouth daily as needed (acid reflux).) 90 capsule 3   rosuvastatin (CRESTOR) 10 MG tablet Take 1 tablet (10 mg total) by mouth daily. 90 tablet 3   vitamin B-6 (B-6) 100 MG tablet Take 1 tablet (100 mg total) by mouth daily.     vitamin E 180 MG (400 UNITS) capsule Take 1 capsule (400 Units total) by mouth daily.     No current facility-administered medications for this visit.     Past Surgical History:  Procedure Laterality Date   A/V SHUNTOGRAM Left 03/19/2019   Procedure: A/V SHUNTOGRAM;  Surgeon: Katha Cabal, MD;  Location: [place] [facility];  Service: Cardiovascular;  Laterality: Left;   ABDOMINAL HYSTERECTOMY  1992   APPENDECTOMY  06/1990   AV FISTULA PLACEMENT  07/2009   left upper arm   AV FISTULA PLACEMENT Right  09/06/2016   Procedure: RIGHT FOREARM ARTERIOVENOUS (AV) GRAFT;  Surgeon: Elam Dutch, MD;  Location: [place];  Service: Vascular;  Laterality: Right;   AV FISTULA PLACEMENT N/A 02/24/2017   Procedure: INSERTION OF ARTERIOVENOUS (AV) GORE-TEX GRAFT ARM (BRACHIAL AXILLARY);  Surgeon: Katha Cabal, MD;  Location: [facility];  Service: Vascular;  Laterality: N/A;  [place] REMOVAL Right 09/06/2016   Procedure: REMOVAL OF Right Arm ARTERIOVENOUS GORETEX GRAFT and Vein Patch angioplasty of brachial artery;  Surgeon: Angelia Mould, MD;  Location: [place];  Service: Vascular;  Laterality: Right;   BIOPSY  09/26/2019   Procedure: BIOPSY;  Surgeon: Rogene Houston, MD;  [facility];  Service: Endoscopy;;   COLON RESECTION  1992   COLON SURGERY     COLONOSCOPY N/A 03/09/2019   Procedure: COLONOSCOPY;  Surgeon: Rogene Houston, MD;  [facility];  Service: Endoscopy;  Laterality: N/A;   COLONOSCOPY WITH PROPOFOL N/A 06/08/2021   Procedure: COLONOSCOPY WITH PROPOFOL;  Surgeon: Harvel Quale, MD;  [facility];  Service: Gastroenterology;  Laterality: N/A;  9:05 /Patient is on dialysis Mon Wed Fri   CORONARY ANGIOPLASTY WITH STENT PLACEMENT  12/15/11   "2"   CORONARY ANGIOPLASTY WITH STENT PLACEMENT  y/2013   "1; makes total of 3" (05/02/2012)   CORONARY ARTERY BYPASS GRAFT  06/13/2012   Procedure: CORONARY ARTERY BYPASS GRAFTING (CABG);  Surgeon: Grace Isaac, MD;  Location: [place];  Service: Open Heart Surgery;  Laterality: N/A;  cabg x four;  using left internal mammary artery, and left leg greater saphenous vein harvested endoscopically   CORONARY STENT INTERVENTION N/A 10/13/2016   Procedure: Coronary Stent Intervention;  Surgeon: Troy Sine, MD;  Location: [place] [facility];  Service: Cardiovascular;  Laterality: N/A;   DIALYSIS/PERMA CATHETER REMOVAL N/A 04/18/2017   Procedure: DIALYSIS/PERMA CATHETER REMOVAL;  Surgeon: Katha Cabal, MD;   Location: [place] [facility];  Service: Cardiovascular;  Laterality: N/A;   DILATION AND CURETTAGE OF UTERUS     ENTEROSCOPY N/A 06/08/2021   Procedure: PUSH ENTEROSCOPY;  Surgeon: Harvel Quale, MD;  [facility];  Service: Gastroenterology;  Laterality: N/A;   ESOPHAGOGASTRODUODENOSCOPY  01/20/2012   Procedure: ESOPHAGOGASTRODUODENOSCOPY (EGD);  Surgeon: Ladene Artist, MD,FACG;  Location: [facility] ENDOSCOPY;  Service: Endoscopy;  Laterality: N/A;   ESOPHAGOGASTRODUODENOSCOPY N/A 03/26/2013   Procedure: ESOPHAGOGASTRODUODENOSCOPY (EGD);  Surgeon: Irene Shipper, MD;  Location: [facility];  Service: Endoscopy;  Laterality: N/A;   ESOPHAGOGASTRODUODENOSCOPY N/A 04/30/2015   Procedure: ESOPHAGOGASTRODUODENOSCOPY (EGD);  Surgeon: Rogene Houston, MD;  [facility];  Service: Endoscopy;  Laterality: N/A;  1pm - moved to 10/20 @ 1:10   ESOPHAGOGASTRODUODENOSCOPY N/A 07/29/2016   Procedure: ESOPHAGOGASTRODUODENOSCOPY (EGD);  Surgeon: Manus Gunning, MD;  Location: [place];  Service: Gastroenterology;  Laterality: N/A;  enteroscopy   ESOPHAGOGASTRODUODENOSCOPY N/A 09/26/2019   Procedure: ESOPHAGOGASTRODUODENOSCOPY (EGD);  Surgeon: Rogene Houston, MD;  [facility];  Service: Endoscopy;  Laterality: N/A;  1250   ESOPHAGOGASTRODUODENOSCOPY (EGD) WITH PROPOFOL N/A 02/05/2021   Procedure: ESOPHAGOGASTRODUODENOSCOPY (EGD) WITH PROPOFOL;  Surgeon: Eloise Harman, DO;  [facility];  Service: Endoscopy;  Laterality: N/A;   GIVENS CAPSULE STUDY N/A 03/07/2019   Procedure: GIVENS CAPSULE STUDY;  Surgeon: Rogene Houston, MD;  [facility];  Service: Endoscopy;  Laterality: N/A;  7:30   GIVENS CAPSULE STUDY N/A 04/22/2021   Procedure: GIVENS CAPSULE STUDY;  Surgeon: Rogene Houston, MD;  [facility];  Service: Endoscopy;  Laterality: N/A;  7:30   INSERTION OF DIALYSIS CATHETER N/A 10/05/2020   Procedure: ABORTED TUNNELED  DIALYSIS CATHETER PLACEMENT RIGHT INTERNAL JUGULAR VEIN ;  Surgeon: Virl Cagey, MD;  [facility];  Service: General;  Laterality: N/A;   INTRAOPERATIVE TRANSESOPHAGEAL ECHOCARDIOGRAM  06/13/2012   Procedure: INTRAOPERATIVE TRANSESOPHAGEAL ECHOCARDIOGRAM;  Surgeon: Grace Isaac, MD;  Location: [place];  Service: Open Heart Surgery;  Laterality: N/A;   IR DIALY SHUNT INTRO NEEDLE/INTRACATH INITIAL W/IMG LEFT Left 10/06/2020   IR FLUORO GUIDE CV LINE RIGHT  06/17/2020   IR GENERIC HISTORICAL  07/26/2016   IR FLUORO GUIDE CV LINE RIGHT 07/26/2016 Greggory Keen, MD [facility]   IR GENERIC HISTORICAL  07/26/2016   IR US GUIDE VASC ACCESS RIGHT 07/26/2016 Greggory Keen, MD [facility]   IR GENERIC HISTORICAL  08/02/2016   IR US GUIDE VASC ACCESS RIGHT 08/02/2016 Greggory Keen, MD [facility]   IR GENERIC HISTORICAL  08/02/2016   IR FLUORO GUIDE CV LINE RIGHT 08/02/2016 Greggory Keen, MD [facility]   IR RADIOLOGY PERIPHERAL GUIDED IV START  03/28/2017   IR REMOVAL TUN CV CATH W/O FL  08/11/2020   IR THROMBECTOMY AV FISTULA W/THROMBOLYSIS INC/SHUNT/IMG LEFT Left 06/17/2020   IR US GUIDE VASC ACCESS LEFT  06/17/2020   IR US GUIDE VASC ACCESS RIGHT  03/28/2017   IR US GUIDE VASC ACCESS RIGHT  06/17/2020   LEFT HEART CATH AND CORONARY ANGIOGRAPHY N/A 09/20/2016   Procedure: Left Heart Cath and Coronary Angiography;  Surgeon: [name], MD;  Location: [employer] [facility];  Service: Cardiovascular;  Laterality: N/A;   LEFT HEART CATH AND CORS/GRAFTS ANGIOGRAPHY N/A 10/13/2016   Procedure: Left Heart Cath and Cors/Grafts Angiography;  Surgeon: Troy Sine, MD;  Location: [place] [facility];  Service: Cardiovascular;  Laterality: N/A;   LEFT HEART CATH AND CORS/GRAFTS ANGIOGRAPHY N/A 07/13/2018   Procedure: LEFT HEART CATH AND CORS/GRAFTS ANGIOGRAPHY;  Surgeon: Martinique, Peter M, MD;  Location: [place] [facility];  Service: Cardiovascular;  Laterality: N/A;   LEFT HEART CATH AND CORS/GRAFTS  ANGIOGRAPHY N/A 07/22/2021   Procedure: LEFT HEART CATH AND CORS/GRAFTS ANGIOGRAPHY;  Surgeon: Lorretta Harp, MD;  Location: [place] [facility];  Service: Cardiovascular;  Laterality: N/A;   LEFT HEART CATHETERIZATION WITH CORONARY ANGIOGRAM N/A 12/15/2011   Procedure: LEFT HEART CATHETERIZATION WITH CORONARY ANGIOGRAM;  Surgeon: Burnell Blanks, MD;  Location: [facility] CATH LAB;  Service: Cardiovascular;  Laterality: N/A;   LEFT HEART CATHETERIZATION WITH CORONARY ANGIOGRAM N/A 01/10/2012   Procedure: LEFT HEART CATHETERIZATION WITH CORONARY ANGIOGRAM;  Surgeon: Peter M Martinique, MD;  Location: [facility] CATH LAB;  Service: Cardiovascular;  Laterality: N/A;   LEFT HEART CATHETERIZATION WITH CORONARY ANGIOGRAM N/A 06/08/2012   Procedure: LEFT HEART CATHETERIZATION WITH CORONARY ANGIOGRAM;  Surgeon: Burnell Blanks, MD;  Location: [facility] CATH LAB;  Service: Cardiovascular;  Laterality: N/A;   LEFT HEART CATHETERIZATION WITH CORONARY/GRAFT ANGIOGRAM N/A 12/10/2013   Procedure: LEFT HEART CATHETERIZATION WITH Beatrix Fetters;  Surgeon: [name], MD;  Location: [facility] CATH LAB;  Service: Cardiovascular;  Laterality: N/A;   OVARY SURGERY     ovarian cancer   POLYPECTOMY  03/09/2019   Procedure: POLYPECTOMY;  Surgeon: Rogene Houston, MD;  [facility];  Service: Endoscopy;;  cecal    POLYPECTOMY N/A 09/26/2019   Procedure: DUODENAL POLYPECTOMY;  Surgeon: Rogene Houston, MD;  [facility];  Service: Endoscopy;  Laterality: N/A;   REVISION OF ARTERIOVENOUS GORETEX GRAFT N/A 02/24/2017   Procedure: REVISION OF ARTERIOVENOUS GORETEX GRAFT (RESECTION);  Surgeon: Katha Cabal, MD;  Location: [facility];  Service: Vascular;  Laterality: N/A;   REVISON OF ARTERIOVENOUS FISTULA Left 06/19/2020   Procedure: REVISION OF LEFT UPPER ARM AV  GRAFT WITH INTERPOSITION JUMP GRAFT USING 6MM GORE LIMB;  Surgeon: Marty Heck, MD;  Location: [place];  Service: Vascular;  Laterality:  Left;   SHUNTOGRAM N/A 10/15/2013   Procedure: Fistulogram;  Surgeon: Serafina Mitchell, MD;  Location: [facility] CATH LAB;  Service: Cardiovascular;  Laterality: N/A;   THROMBECTOMY / ARTERIOVENOUS GRAFT REVISION  2011   left upper arm   TUBAL LIGATION  1980's   UPPER EXTREMITY ANGIOGRAPHY Bilateral 12/06/2016   Procedure: Upper Extremity Angiography;  Surgeon: Katha Cabal, MD;  Location: [place] [facility];  Service: Cardiovascular;  Laterality: Bilateral;   UPPER EXTREMITY INTERVENTION Left 06/06/2017   Procedure: UPPER EXTREMITY INTERVENTION;  Surgeon: Katha Cabal, MD;  Location: [place] [facility];  Service: Cardiovascular;  Laterality: Left;     Allergies  Allergen Reactions   Amlodipine Swelling   Aspirin Other (See Comments)    High Doses Mess up her stomach; "makes my bowels have blood in them". Takes 81 mg EC Aspirin    Nitrofurantoin Hives   Bactrim [Sulfamethoxazole-Trimethoprim] Rash   Contrast Media [Iodinated Contrast Media] Itching   Iron Itching and Other (See Comments)    "they gave me iron in dialysis; had to give me Benadryl cause I had to have the iron" (05/02/2012)   Tylenol [Acetaminophen] Itching and Other (See Comments)    Makes her feet on fire per pt   Gabapentin Other (See Comments)    Unknown reaction   Iron Sucrose Other (See Comments)    Unknown   Ranexa [Ranolazine] Other (See Comments)    Myoclonus-hospitalized    Sucroferric Oxyhydroxide Other (See Comments)    Unknown   Dexilant [Dexlansoprazole] Other (See Comments)    Upset stomach   Hydralazine Itching    Has tolerated while inpatient Has tolerated while inpatient   Levaquin [Levofloxacin In D5w] Rash   Morphine And Related Itching and Other (See Comments)    Itching in feet   Pantoprazole Rash   Plavix [Clopidogrel Bisulfate] Rash   Protonix [Pantoprazole Sodium] Rash   Venofer [Ferric Oxide] Itching and Other (See Comments)    Patient reports using Benadryl prior to doses as  [place]      Family History  Problem Relation Age of Onset   Heart disease Mother        Heart Disease before age 19   Hyperlipidemia Mother    Hypertension Mother    Diabetes Mother    Heart attack Mother    Heart disease Father        Heart Disease before age 30   Hyperlipidemia Father    Hypertension Father    Diabetes Father    Diabetes Sister    Hypertension Sister    Diabetes Brother    Hyperlipidemia Brother    Heart attack Brother    Hypertension Sister    Heart attack Brother    Colon cancer Child 52   Other Other        noncontributory for early CAD   Esophageal cancer Neg Hx    Liver disease Neg Hx    Kidney disease Neg Hx    Colon polyps Neg Hx      Social History Shawna [name] reports that she has never smoked. She has never used smokeless tobacco. Shawna [name] reports no history of alcohol use.   Review of Systems CONSTITUTIONAL: No weight loss, fever, chills, weakness or fatigue.  HEENT: Eyes: No visual loss, blurred vision, double vision or yellow sclerae.No  hearing loss, sneezing, congestion, runny nose or sore throat.  SKIN: No rash or itching.  CARDIOVASCULAR:  RESPIRATORY: No shortness of breath, cough or sputum.  GASTROINTESTINAL: No anorexia, nausea, vomiting or diarrhea. No abdominal pain or blood.  GENITOURINARY: No burning on urination, no polyuria NEUROLOGICAL: No headache, dizziness, syncope, paralysis, ataxia, numbness or tingling in the extremities. No change in bowel or bladder control.  MUSCULOSKELETAL: No muscle, back pain, joint pain or stiffness.  LYMPHATICS: No enlarged nodes. No history of splenectomy.  PSYCHIATRIC: No history of depression or anxiety.  ENDOCRINOLOGIC: No reports of sweating, cold or heat intolerance. No polyuria or polydipsia.  Marland Kitchen   Physical Examination There were no vitals filed for this visit. There were no vitals filed for this visit.  Gen: resting comfortably, no acute distress HEENT: no scleral  icterus, pupils equal round and reactive, no palptable cervical adenopathy,  CV Resp: Clear to auscultation bilaterally GI: abdomen is soft, non-tender, non-distended, normal bowel sounds, no hepatosplenomegaly MSK: extremities are warm, no edema.  Skin: warm, no rash Neuro:  no focal deficits Psych: appropriate affect   Diagnostic Studies     Assessment and Plan   1.CAD - chronic stable symptoms, antianginal therpay has been limited due to low bp's and side effects - recent atypical symptoms though she reports better with SL NG, will try increasing imdur to '90mg'$  daily     2. AFib/PSVT - infrequent palpitations, continue amiodarone and lopressor - not on anticoag due to prior GI bleeding     3. Hyperlipidemia - should be on just crestor '10mg'$  daily, will clarify not also getting simvastatin with her pharmacy.      Arnoldo Lenis, M.D., F.A.C.C.

## 2021-12-22 ENCOUNTER — Other Ambulatory Visit: Payer: Self-pay | Admitting: Nurse Practitioner

## 2022-01-04 ENCOUNTER — Encounter (HOSPITAL_COMMUNITY): Payer: Self-pay | Admitting: Emergency Medicine

## 2022-01-04 ENCOUNTER — Emergency Department (HOSPITAL_COMMUNITY)
Admission: EM | Admit: 2022-01-04 | Discharge: 2022-01-04 | Disposition: A | Discharge status: Home / Self Care | Payer: Medicare HMO | Attending: Emergency Medicine | Admitting: Emergency Medicine

## 2022-01-04 ENCOUNTER — Other Ambulatory Visit: Payer: Self-pay

## 2022-01-04 ENCOUNTER — Emergency Department (HOSPITAL_COMMUNITY): Payer: Medicare HMO

## 2022-01-04 DIAGNOSIS — R001 Bradycardia, unspecified: Secondary | ICD-10-CM | POA: Diagnosis present

## 2022-01-04 DIAGNOSIS — Z992 Dependence on renal dialysis: Secondary | ICD-10-CM | POA: Diagnosis not present

## 2022-01-04 DIAGNOSIS — N186 End stage renal disease: Secondary | ICD-10-CM | POA: Diagnosis not present

## 2022-01-04 LAB — COMPREHENSIVE METABOLIC PANEL
ALT: 15 U/L (ref 0–44)
AST: 10 U/L — ABNORMAL LOW (ref 15–41)
Albumin: 3.5 g/dL (ref 3.5–5.0)
Alkaline Phosphatase: 166 U/L — ABNORMAL HIGH (ref 38–126)
Anion gap: 15 (ref 5–15)
BUN: 31 mg/dL — ABNORMAL HIGH (ref 8–23)
CO2: 22 mmol/L (ref 22–32)
Calcium: 8.6 mg/dL — ABNORMAL LOW (ref 8.9–10.3)
Chloride: 98 mmol/L (ref 98–111)
Creatinine, Ser: 6 mg/dL — ABNORMAL HIGH (ref 0.44–1.00)
GFR, Estimated: 7 mL/min — ABNORMAL LOW (ref >60)
Glucose, Bld: 85 mg/dL (ref 70–99)
Potassium: 4.5 mmol/L (ref 3.5–5.1)
Sodium: 135 mmol/L (ref 135–145)
Total Bilirubin: 0.5 mg/dL (ref 0.3–1.2)
Total Protein: 7.2 g/dL (ref 6.5–8.1)

## 2022-01-04 LAB — MAGNESIUM: Magnesium: 1.8 mg/dL (ref 1.7–2.4)

## 2022-01-04 NOTE — ED Provider Triage Note (Signed)
Emergency Medicine Provider Triage Evaluation Note  Shawna [name] , a 81 y.o. female  was evaluated in triage.  Pt complains of bradycardia, low blood pressure, and weakness starting couple hours after she woke up this morning.  Goes to dialysis MWF, went yesterday with no issues.  No known Hx of arrhythmias per patient.  Hx of CABG about 6 years ago.  Denies chest pain, shortness of breath, vomiting, recent fevers, neck stiffness.  Felt some nausea before she came to the ED, took her usual tablet for relief.  On amiodarone.  Not on anticoagulation.  Review of Systems  Positive:  Negative: See above  Physical Exam  BP (!) 131/39 (BP Location: Right Arm)   Pulse (!) 44   Temp 98.6 F (37 C) (Oral)   Resp 18   Ht '5\' 1"'$  (1.549 m)   Wt 51.8 kg   SpO2 96%   BMI 21.58 kg/m  Gen:   Awake, no distress  Resp:  Normal effort, CTAB MSK:   Moves extremities with some difficulty Other:  AV fistula left arm; abdomen soft, nontender  Medical Decision Making  Medically screening exam initiated at 7:00 PM.  Appropriate orders placed.  [name] was informed that the remainder of the evaluation will be completed by another provider, this initial triage assessment does not replace that evaluation, and the importance of remaining in the ED until their evaluation is complete.     Prince Rome, [place] [birth year] Einar Crow

## 2022-01-04 NOTE — ED Triage Notes (Signed)
Pt had occupational therapist come to house today and when they checked vitals signs pt HR was 47 and told to come ER. Pt complains of weakness. Pt had dialysis yesterday. Denies any pain.

## 2022-01-17 ENCOUNTER — Telehealth: Payer: Self-pay | Admitting: Cardiology

## 2022-01-17 NOTE — Telephone Encounter (Signed)
Pt c/o of Chest Pain: STAT if CP now or developed within 24 hours  1. Are you having CP right now?  No  2. Are you experiencing any other symptoms (ex. SOB, nausea, vomiting, sweating)?   No  3. How long have you been experiencing CP?       2 days  4. Is your CP continuous or coming and going?    Coming and going  5. Have you taken Nitroglycerin?    Yes  Caller stated the patient complained of soreness in her chest last night and she took some nitroglycerin and it did help.  Caller stated the patient's chest pains occurred at night and during the day she just felt soreness.  Please call the patient directly.

## 2022-01-17 NOTE — Telephone Encounter (Signed)
I spoke with patient and relayed Dr.Branch's message to her. She denies any significant change with occasional need for NTG.She will let us know if anything changes.

## 2022-01-17 NOTE — Telephone Encounter (Signed)
Attempt to reach patient, line rings, no answer, no machine.

## 2022-01-17 NOTE — Telephone Encounter (Signed)
From my notes has had these symptoms intermittently for some time, unless signifcant change from her prior chronic symptoms just something to monitor at this time  Zandra Abts MD

## 2022-01-17 NOTE — Telephone Encounter (Signed)
Patient is currently at dialysis. Spouse told me she has had an episode of chest soreness for the past two days. She took NTG and pain was relieved. According to phone note, she is asymptomatic now.    Please advise

## 2022-01-18 ENCOUNTER — Emergency Department (HOSPITAL_COMMUNITY)
Admission: EM | Admit: 2022-01-18 | Discharge: 2022-01-19 | Disposition: A | Discharge status: Home / Self Care | Payer: Medicare HMO | Attending: Emergency Medicine | Admitting: Emergency Medicine

## 2022-01-18 ENCOUNTER — Encounter (HOSPITAL_COMMUNITY): Payer: Self-pay | Admitting: Emergency Medicine

## 2022-01-18 ENCOUNTER — Emergency Department (HOSPITAL_COMMUNITY): Payer: Medicare HMO

## 2022-01-18 ENCOUNTER — Other Ambulatory Visit: Payer: Self-pay

## 2022-01-18 DIAGNOSIS — I4891 Unspecified atrial fibrillation: Secondary | ICD-10-CM | POA: Insufficient documentation

## 2022-01-18 DIAGNOSIS — I2510 Atherosclerotic heart disease of native coronary artery without angina pectoris: Secondary | ICD-10-CM | POA: Insufficient documentation

## 2022-01-18 DIAGNOSIS — R079 Chest pain, unspecified: Secondary | ICD-10-CM | POA: Insufficient documentation

## 2022-01-18 DIAGNOSIS — I10 Essential (primary) hypertension: Secondary | ICD-10-CM | POA: Insufficient documentation

## 2022-01-18 DIAGNOSIS — Z79899 Other long term (current) drug therapy: Secondary | ICD-10-CM | POA: Diagnosis not present

## 2022-01-18 NOTE — ED Triage Notes (Signed)
Pt c/o chest pain that radiates down left arm x 2 hours.

## 2022-01-19 ENCOUNTER — Emergency Department (HOSPITAL_COMMUNITY): Payer: Medicare HMO

## 2022-01-19 LAB — CBC
HCT: 25.8 % — ABNORMAL LOW (ref 36.0–46.0)
Hemoglobin: 8.4 g/dL — ABNORMAL LOW (ref 12.0–15.0)
MCH: 37.2 pg — ABNORMAL HIGH (ref 26.0–34.0)
MCHC: 32.6 g/dL (ref 30.0–36.0)
MCV: 114.2 fL — ABNORMAL HIGH (ref 80.0–100.0)
Platelets: 255 10*3/uL (ref 150–400)
RBC: 2.26 MIL/uL — ABNORMAL LOW (ref 3.87–5.11)
RDW: 18.6 % — ABNORMAL HIGH (ref 11.5–15.5)
WBC: 6 10*3/uL (ref 4.0–10.5)
nRBC: 0 % (ref 0.0–0.2)

## 2022-01-19 LAB — BASIC METABOLIC PANEL
Anion gap: 12 (ref 5–15)
BUN: 47 mg/dL — ABNORMAL HIGH (ref 8–23)
CO2: 24 mmol/L (ref 22–32)
Calcium: 8.9 mg/dL (ref 8.9–10.3)
Chloride: 103 mmol/L (ref 98–111)
Creatinine, Ser: 7.28 mg/dL — ABNORMAL HIGH (ref 0.44–1.00)
GFR, Estimated: 5 mL/min — ABNORMAL LOW (ref >60)
Glucose, Bld: 204 mg/dL — ABNORMAL HIGH (ref 70–99)
Potassium: 3.5 mmol/L (ref 3.5–5.1)
Sodium: 139 mmol/L (ref 135–145)

## 2022-01-19 LAB — TROPONIN I (HIGH SENSITIVITY)
Troponin I (High Sensitivity): 32 ng/L — ABNORMAL HIGH (ref <18)
Troponin I (High Sensitivity): 32 ng/L — ABNORMAL HIGH (ref <18)

## 2022-01-19 NOTE — ED Notes (Signed)
Pt states she took 2 nitro tabs at home and pain is better now.

## 2022-01-19 NOTE — ED Provider Notes (Signed)
[facility] EMERGENCY DEPARTMENT Provider Note   CSN: [id number] Arrival date & time: 01/18/22  2344     History  Chief Complaint  Patient presents with   Chest Pain    MARCHETTA NAVRATIL is a 81 y.o. female.  81 year old female with a past medical history of coronary artery disease (status post multiple stents in CABG back in 2017), end-stage renal disease, hypertension and A-fib who presents to the ER today secondary to chest pain.  Patient states that tonight she will have some chest pain.  Nitro but tonight she had left-sided chest pain that radiated down her arm.  She took nitroglycerin as instructed and this improved but if she does not usually have arm pain with this she presented to the ER for further evaluation.  No associated shortness of breath, nausea, lightheadedness or diaphoresis.  Has been pain-free for over an hour at the time of my evaluation.  No recent illnesses.  No lower extremity swelling.  Compliant with dialysis and actually has dialysis later today.  Last dialysis was on Monday and had a full session without any complications.   Chest Pain      Home Medications Prior to Admission medications   Medication Sig Start Date End Date Taking? Authorizing Provider  amiodarone (PACERONE) 100 MG tablet Take 2 tablets (200 mg total) by mouth daily. 11/23/21   Ghimire, Henreitta Leber, MD  cyclobenzaprine (FLEXERIL) 5 MG tablet Take 5 mg by mouth 3 (three) times daily as needed. 12/27/21   [provider]  hydrOXYzine (VISTARIL) 100 MG capsule Take 100 mg by mouth at bedtime. 12/13/21   [provider]  levothyroxine (SYNTHROID) 25 MCG tablet Take 1 tablet (25 mcg total) by mouth daily. 02/02/21   Brita Romp, NP  midodrine (PROAMATINE) 5 MG tablet Take 5-10 mg by mouth See admin instructions. 3 times a week on dialysis days 12/10/21   [provider]  multivitamin (RENA-VIT) TABS tablet Take 1 tablet by mouth daily.    [provider]   omeprazole (PRILOSEC) 20 MG capsule Take 1 capsule (20 mg total) by mouth daily. Patient taking differently: Take 40 mg by mouth daily as needed (acid reflux). 12/01/20   Rehman, Mechele Dawley, MD  rosuvastatin (CRESTOR) 10 MG tablet Take 1 tablet (10 mg total) by mouth daily. 05/13/21   Strader, Fransisco Hertz, PA-C  sevelamer carbonate (RENVELA) 800 MG tablet Take 2,400 mg by mouth 3 (three) times daily. 12/27/21   [provider]  vitamin B-6 (B-6) 100 MG tablet Take 1 tablet (100 mg total) by mouth daily. Patient not taking: Reported on 01/04/2022 11/24/21   Jonetta Osgood, MD  vitamin E 180 MG (400 UNITS) capsule Take 1 capsule (400 Units total) by mouth daily. Patient not taking: Reported on 01/04/2022 11/24/21   Jonetta Osgood, MD      Allergies    Amlodipine, Aspirin, Nitrofurantoin, Bactrim [sulfamethoxazole-trimethoprim], Contrast media [iodinated contrast media], Iron, Tylenol [acetaminophen], Gabapentin, Iron sucrose, Ranexa [ranolazine], Sucroferric oxyhydroxide, Dexilant [dexlansoprazole], Hydralazine, Levaquin [levofloxacin in d5w], Morphine and related, Pantoprazole, Plavix [clopidogrel bisulfate], Protonix [pantoprazole sodium], and Venofer [ferric oxide]    Review of Systems   Review of Systems  Cardiovascular:  Positive for chest pain.    Physical Exam Updated Vital Signs BP (!) 138/47   Pulse 69   Temp 98.4 F (36.9 C) (Oral)   Resp 17   Ht '5\' 1"'$  (1.549 m)   Wt 51.8 kg   SpO2 99%   BMI  21.58 kg/m  Physical Exam Vitals and nursing note reviewed.  Constitutional:      Appearance: She is well-developed.  HENT:     Head: Normocephalic and atraumatic.  Cardiovascular:     Rate and Rhythm: Normal rate and regular rhythm.  Pulmonary:     Effort: No respiratory distress.     Breath sounds: No stridor.  Chest:     Chest wall: No tenderness.  Abdominal:     General: There is no distension.     Palpations: There is no mass.     Tenderness: There is no  abdominal tenderness. There is no guarding or rebound.  Musculoskeletal:     Cervical back: Normal range of motion.  Skin:    General: Skin is warm and dry.  Neurological:     Mental Status: She is alert.     ED Results / Procedures / Treatments   Labs (all labs ordered are listed, but only abnormal results are displayed) Labs Reviewed  BASIC METABOLIC PANEL - Abnormal; Notable for the following components:      Result Value   Glucose, Bld 204 (*)    BUN 47 (*)    Creatinine, Ser 7.28 (*)    GFR, Estimated 5 (*)    All other components within normal limits  CBC - Abnormal; Notable for the following components:   RBC 2.26 (*)    Hemoglobin 8.4 (*)    HCT 25.8 (*)    MCV 114.2 (*)    MCH 37.2 (*)    RDW 18.6 (*)    All other components within normal limits  TROPONIN I (HIGH SENSITIVITY) - Abnormal; Notable for the following components:   Troponin I (High Sensitivity) 32 (*)    All other components within normal limits  TROPONIN I (HIGH SENSITIVITY) - Abnormal; Notable for the following components:   Troponin I (High Sensitivity) 32 (*)    All other components within normal limits    EKG None  Radiology DG Chest 2 View  Result Date: 01/19/2022 CLINICAL DATA:  Chest pain EXAM: CHEST - 2 VIEW COMPARISON:  01/04/2022 FINDINGS: Linear bibasilar atelectasis or scarring, similar prior study. Heart is normal size. Prior CABG. No acute confluent opacities or effusions. Aortic calcifications. No acute bony abnormality. IMPRESSION: Bibasilar atelectasis or scarring.  No active disease. Electronically Signed   By: Rolm Baptise M.D.   On: 01/19/2022 00:29    Procedures Procedures    Medications Ordered in ED Medications - No data to display  ED Course/ Medical Decision Making/ A&P                           Medical Decision Making Amount and/or Complexity of Data Reviewed Labs: ordered. Radiology: ordered.   With her significant coronary artery disease history EKG was  done which was similar morphology to previous EKGs without any evidence of ACS.  Delta troponins were done and were flat.  I suspect they are slightly elevated related to her end-stage renal disease doubt this is indicative of ACS at this time.  She is pain-free the whole time in the ER over 5 hours.  She has close communication with Dr. Harl Bowie and continue to follow with him.  I will send him a message to let him know that she was here.  No indication for hospitalization or further work-up at this time she knows that anytime she has any significant chest pain or change in her symptoms to  return to the ER.  Low suspicion for pulmonary embolus, pneumothorax, pneumonia based on negative chest x-ray.  CXR done and showed no significant changes (independently viewed and interpreted by myself and radiology read reviewed).  Final Clinical Impression(s) / ED Diagnoses Final diagnoses:  Nonspecific chest pain    Rx / DC Orders ED Discharge Orders     None         Shauntelle Jamerson, Corene Cornea, MD 01/19/22 [phone]

## 2022-02-09 ENCOUNTER — Ambulatory Visit: Payer: Medicare HMO | Admitting: Cardiology

## 2022-02-09 NOTE — Progress Notes (Deleted)
Clinical Summary Shawna [name] is a 81 y.o.female  seen today for follow up of the following medical problems.    1.CAD - history of CABG in 2013, along with DES to D1 in 10/2016 - cath in 07/2018 showing patent grafts with occlusion of D1 at prior stent site and progression of PDA disease --> medical management recommended)   - did not tolerate Ranexa and had hypotension with BB therapy in the past. - some chest pain every 2-3 days. Overall stable.      06/17/21 ER vsiit with chest pain - Trop 23-25 - EKG SR, LBBB with PVCs - she describes the pain was positional, worst with palpation in the ER.  - pain improves with NG.Takes NG 1-2 times per week.     ER visit 01/19/22 with chest pain - negative workup for ACS   2. Chronic diastolic HF - fluid managed with HD     3. HTN - compliant with meds, complicated by low bp's on HD.      4. Hyperlipidemia - 11/2020 TC 123 TG 87 LDL 48  - has both simva and rosuvastatin on her list, she is not sure what she is taking.      5. PAF - not on anticoagulation given history of GIB     - admit 04/2021 with SVT and PAF. - restarted on amiodarone. - occasional symptoms with laying down, infrequent.     - ER visit 01/04/22 reportedly HRs 40s at home. BP's weren normal, asymptomatic - beta blocker stopped, had been on lopressor '25mg'$  bid   6. NSVT - had been on amio according to notes - amio was stopped at 12/17/20 discharge in setting of elevated TSH - back on amio after 04/2021 admission     7. ESRD - occasional low bp's during HD         8. 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  Past Medical History:  Diagnosis Date   Acute on chronic respiratory failure with hypoxia ([place]) 10/10/2016   Anxiety    Arthritis    AVM (arteriovenous malformation) of colon    CAD (coronary artery disease)    a. s/p CABG in 2013 b. DES to D1 in 10/2016. c. cath in 07/2018 showing patent grafts with occlusion of D1 at  prior stent site and progression of PDA disease --> medical management recommended   Carotid artery disease ([place])    a. 16-10% LICA, 03/6044    Chronic anemia    Chronic bronchitis (HCC)    Chronic diastolic CHF (congestive heart failure) ([place])    a. 02/2012 Echo EF 60-65%, nl wall motion, Gr 1 DD, mod MR   Colon cancer ([place]) 1992   Esophageal stricture    ESRD on hemodialysis ([place])    ESRD due to HTN, started dialysis 2011 and gets HD at [facility] with Dr Hinda Lenis on MWF schedule.  Access is LUA AVF as of Sept 2014.    GERD (gastroesophageal reflux disease)    High cholesterol 12/2011   History of blood transfusion 07/2011; 12/2011; 01/2012 X 2; 04/2012   History of gout    History of lower GI bleeding    Hypertension    Iron deficiency anemia    Jugular vein occlusion, right (HCC)    Mitral regurgitation    a. Moderate by echo, 02/2012   Myocardial infarction [facility])    NSVT (nonsustained ventricular tachycardia) ([place])    Ovarian cancer ([place]) 1992  PAF (paroxysmal atrial fibrillation) (HCC)    Pneumonia ~ 2009   PUD (peptic ulcer disease)    TIA (transient ischemic attack)      Allergies  Allergen Reactions   Amlodipine Swelling   Aspirin Other (See Comments)    High Doses Mess up her stomach; "makes my bowels have blood in them". Takes 81 mg EC Aspirin    Nitrofurantoin Hives   Bactrim [Sulfamethoxazole-Trimethoprim] Rash   Contrast Media [Iodinated Contrast Media] Itching   Iron Itching and Other (See Comments)    "they gave me iron in dialysis; had to give me Benadryl cause I had to have the iron" (05/02/2012)   Tylenol [Acetaminophen] Itching and Other (See Comments)    Makes her feet on fire per pt   Gabapentin Other (See Comments)    Unknown reaction   Iron Sucrose Other (See Comments)    Unknown   Ranexa [Ranolazine] Other (See Comments)    Myoclonus-hospitalized    Sucroferric Oxyhydroxide Other (See Comments)    Unknown   Dexilant [Dexlansoprazole] Other (See  Comments)    Upset stomach   Hydralazine Itching    Has tolerated while inpatient Has tolerated while inpatient   Levaquin [Levofloxacin In D5w] Rash   Morphine And Related Itching and Other (See Comments)    Itching in feet   Pantoprazole Rash   Plavix [Clopidogrel Bisulfate] Rash   Protonix [Pantoprazole Sodium] Rash   Venofer [Ferric Oxide] Itching and Other (See Comments)    Patient reports using Benadryl prior to doses as [facility])     Current Outpatient Medications  Medication Sig Dispense Refill   amiodarone (PACERONE) 100 MG tablet Take 2 tablets (200 mg total) by mouth daily. 30 tablet 2   cyclobenzaprine (FLEXERIL) 5 MG tablet Take 5 mg by mouth 3 (three) times daily as needed.     hydrOXYzine (VISTARIL) 100 MG capsule Take 100 mg by mouth at bedtime.     levothyroxine (SYNTHROID) 25 MCG tablet Take 1 tablet (25 mcg total) by mouth daily. 90 tablet 0   midodrine (PROAMATINE) 5 MG tablet Take 5-10 mg by mouth See admin instructions. 3 times a week on dialysis days     multivitamin (RENA-VIT) TABS tablet Take 1 tablet by mouth daily.     omeprazole (PRILOSEC) 20 MG capsule Take 1 capsule (20 mg total) by mouth daily. (Patient taking differently: Take 40 mg by mouth daily as needed (acid reflux).) 90 capsule 3   rosuvastatin (CRESTOR) 10 MG tablet Take 1 tablet (10 mg total) by mouth daily. 90 tablet 3   sevelamer carbonate (RENVELA) 800 MG tablet Take 2,400 mg by mouth 3 (three) times daily.     vitamin B-6 (B-6) 100 MG tablet Take 1 tablet (100 mg total) by mouth daily. (Patient not taking: Reported on 01/04/2022)     vitamin E 180 MG (400 UNITS) capsule Take 1 capsule (400 Units total) by mouth daily. (Patient not taking: Reported on 01/04/2022)     No current facility-administered medications for this visit.     Past Surgical History:  Procedure Laterality Date   A/V SHUNTOGRAM Left 03/19/2019   Procedure: A/V SHUNTOGRAM;  Surgeon: Katha Cabal, MD;  Location:  [place] [facility];  Service: Cardiovascular;  Laterality: Left;   ABDOMINAL HYSTERECTOMY  1992   APPENDECTOMY  06/1990   AV FISTULA PLACEMENT  07/2009   left upper arm   AV FISTULA PLACEMENT Right 09/06/2016   Procedure: RIGHT FOREARM ARTERIOVENOUS (AV) GRAFT;  Surgeon: Elam Dutch, MD;  Location: [facility];  Service: Vascular;  Laterality: Right;   AV FISTULA PLACEMENT N/A 02/24/2017   Procedure: INSERTION OF ARTERIOVENOUS (AV) GORE-TEX GRAFT ARM (BRACHIAL AXILLARY);  Surgeon: Katha Cabal, MD;  Location: [facility] ORS;  Service: Vascular;  Laterality: N/A;   [place] Right 09/06/2016   Procedure: REMOVAL OF Right Arm ARTERIOVENOUS GORETEX GRAFT and Vein Patch angioplasty of brachial artery;  Surgeon: Angelia Mould, MD;  Location: [place];  Service: Vascular;  Laterality: Right;   BIOPSY  09/26/2019   Procedure: BIOPSY;  Surgeon: Rogene Houston, MD;  [facility];  Service: Endoscopy;;   COLON RESECTION  1992   COLON SURGERY     COLONOSCOPY N/A 03/09/2019   Procedure: COLONOSCOPY;  Surgeon: Rogene Houston, MD;  [facility];  Service: Endoscopy;  Laterality: N/A;   COLONOSCOPY WITH PROPOFOL N/A 06/08/2021   Procedure: COLONOSCOPY WITH PROPOFOL;  Surgeon: Harvel Quale, MD;  [facility];  Service: Gastroenterology;  Laterality: N/A;  9:05 /Patient is on dialysis Mon Wed Fri   CORONARY ANGIOPLASTY WITH STENT PLACEMENT  12/15/11   "2"   CORONARY ANGIOPLASTY WITH STENT PLACEMENT  y/2013   "1; makes total of 3" (05/02/2012)   CORONARY ARTERY BYPASS GRAFT  06/13/2012   Procedure: CORONARY ARTERY BYPASS GRAFTING (CABG);  Surgeon: Grace Isaac, MD;  Location: [place];  Service: Open Heart Surgery;  Laterality: N/A;  cabg x four;  using left internal mammary artery, and left leg greater saphenous vein harvested endoscopically   CORONARY STENT INTERVENTION N/A 10/13/2016   Procedure: Coronary Stent Intervention;  Surgeon: Troy Sine, MD;   Location: [place] [facility];  Service: Cardiovascular;  Laterality: N/A;   DIALYSIS/PERMA CATHETER REMOVAL N/A 04/18/2017   Procedure: DIALYSIS/PERMA CATHETER REMOVAL;  Surgeon: Katha Cabal, MD;  Location: [place] [facility];  Service: Cardiovascular;  Laterality: N/A;   DILATION AND CURETTAGE OF UTERUS     ENTEROSCOPY N/A 06/08/2021   Procedure: PUSH ENTEROSCOPY;  Surgeon: Harvel Quale, MD;  [facility];  Service: Gastroenterology;  Laterality: N/A;   ESOPHAGOGASTRODUODENOSCOPY  01/20/2012   Procedure: ESOPHAGOGASTRODUODENOSCOPY (EGD);  Surgeon: Ladene Artist, MD,FACG;  Location: [facility] ENDOSCOPY;  Service: Endoscopy;  Laterality: N/A;   ESOPHAGOGASTRODUODENOSCOPY N/A 03/26/2013   Procedure: ESOPHAGOGASTRODUODENOSCOPY (EGD);  Surgeon: Irene Shipper, MD;  Location: [facility] ENDOSCOPY;  Service: Endoscopy;  Laterality: N/A;   ESOPHAGOGASTRODUODENOSCOPY N/A 04/30/2015   Procedure: ESOPHAGOGASTRODUODENOSCOPY (EGD);  Surgeon: Rogene Houston, MD;  [facility];  Service: Endoscopy;  Laterality: N/A;  1pm - moved to 10/20 @ 1:10   ESOPHAGOGASTRODUODENOSCOPY N/A 07/29/2016   Procedure: ESOPHAGOGASTRODUODENOSCOPY (EGD);  Surgeon: Manus Gunning, MD;  Location: [place];  Service: Gastroenterology;  Laterality: N/A;  enteroscopy   ESOPHAGOGASTRODUODENOSCOPY N/A 09/26/2019   Procedure: ESOPHAGOGASTRODUODENOSCOPY (EGD);  Surgeon: Rogene Houston, MD;  [facility];  Service: Endoscopy;  Laterality: N/A;  1250   ESOPHAGOGASTRODUODENOSCOPY (EGD) WITH PROPOFOL N/A 02/05/2021   Procedure: ESOPHAGOGASTRODUODENOSCOPY (EGD) WITH PROPOFOL;  Surgeon: Eloise Harman, DO;  [facility];  Service: Endoscopy;  Laterality: N/A;   GIVENS CAPSULE STUDY N/A 03/07/2019   Procedure: GIVENS CAPSULE STUDY;  Surgeon: Rogene Houston, MD;  [facility];  Service: Endoscopy;  Laterality: N/A;  7:30   GIVENS CAPSULE STUDY N/A 04/22/2021   Procedure:  GIVENS CAPSULE STUDY;  Surgeon: Rogene Houston, MD;  [facility];  Service: Endoscopy;  Laterality: N/A;  7:30   INSERTION OF DIALYSIS CATHETER N/A 10/05/2020   Procedure: ABORTED TUNNELED DIALYSIS CATHETER PLACEMENT RIGHT INTERNAL JUGULAR VEIN ;  Surgeon: Virl Cagey, MD;  [facility]: [facility];  Service: General;  Laterality: N/A;   INTRAOPERATIVE TRANSESOPHAGEAL ECHOCARDIOGRAM  06/13/2012   Procedure: INTRAOPERATIVE TRANSESOPHAGEAL ECHOCARDIOGRAM;  Surgeon: Grace Isaac, MD;  Location: [place];  Service: Open Heart Surgery;  Laterality: N/A;   IR DIALY SHUNT INTRO NEEDLE/INTRACATH INITIAL W/IMG LEFT Left 10/06/2020   IR FLUORO GUIDE CV LINE RIGHT  06/17/2020   IR GENERIC HISTORICAL  07/26/2016   IR FLUORO GUIDE CV LINE RIGHT 07/26/2016 Greggory Keen, MD [facility]   IR GENERIC HISTORICAL  07/26/2016   IR US GUIDE VASC ACCESS RIGHT 07/26/2016 Greggory Keen, MD [facility]   IR GENERIC HISTORICAL  08/02/2016   IR US GUIDE VASC ACCESS RIGHT 08/02/2016 Greggory Keen, MD [facility]   IR GENERIC HISTORICAL  08/02/2016   IR FLUORO GUIDE CV LINE RIGHT 08/02/2016 Greggory Keen, MD [facility]   IR RADIOLOGY PERIPHERAL GUIDED IV START  03/28/2017   IR REMOVAL TUN CV CATH W/O FL  08/11/2020   IR THROMBECTOMY AV FISTULA W/THROMBOLYSIS INC/SHUNT/IMG LEFT Left 06/17/2020   IR US GUIDE VASC ACCESS LEFT  06/17/2020   IR US GUIDE VASC ACCESS RIGHT  03/28/2017   IR US GUIDE VASC ACCESS RIGHT  06/17/2020   LEFT HEART CATH AND CORONARY ANGIOGRAPHY N/A 09/20/2016   Procedure: Left Heart Cath and Coronary Angiography;  Surgeon: [name], MD;  Location: [place] [facility];  Service: Cardiovascular;  Laterality: N/A;   LEFT HEART CATH AND CORS/GRAFTS ANGIOGRAPHY N/A 10/13/2016   Procedure: Left Heart Cath and Cors/Grafts Angiography;  Surgeon: Troy Sine, MD;  Location: [place] [facility];  Service: Cardiovascular;  Laterality: N/A;   LEFT HEART CATH AND CORS/GRAFTS ANGIOGRAPHY N/A 07/13/2018    Procedure: LEFT HEART CATH AND CORS/GRAFTS ANGIOGRAPHY;  Surgeon: Martinique, Peter M, MD;  Location: [place] [facility];  Service: Cardiovascular;  Laterality: N/A;   LEFT HEART CATH AND CORS/GRAFTS ANGIOGRAPHY N/A 07/22/2021   Procedure: LEFT HEART CATH AND CORS/GRAFTS ANGIOGRAPHY;  Surgeon: Lorretta Harp, MD;  Location: [place] [facility];  Service: Cardiovascular;  Laterality: N/A;   LEFT HEART CATHETERIZATION WITH CORONARY ANGIOGRAM N/A 12/15/2011   Procedure: LEFT HEART CATHETERIZATION WITH CORONARY ANGIOGRAM;  Surgeon: Burnell Blanks, MD;  Location: [facility] CATH LAB;  Service: Cardiovascular;  Laterality: N/A;   LEFT HEART CATHETERIZATION WITH CORONARY ANGIOGRAM N/A 01/10/2012   Procedure: LEFT HEART CATHETERIZATION WITH CORONARY ANGIOGRAM;  Surgeon: Peter M Martinique, MD;  Location: [facility] CATH LAB;  Service: Cardiovascular;  Laterality: N/A;   LEFT HEART CATHETERIZATION WITH CORONARY ANGIOGRAM N/A 06/08/2012   Procedure: LEFT HEART CATHETERIZATION WITH CORONARY ANGIOGRAM;  Surgeon: Burnell Blanks, MD;  Location: [facility] CATH LAB;  Service: Cardiovascular;  Laterality: N/A;   LEFT HEART CATHETERIZATION WITH CORONARY/GRAFT ANGIOGRAM N/A 12/10/2013   Procedure: LEFT HEART CATHETERIZATION WITH Beatrix Fetters;  Surgeon: [name], MD;  Location: [facility] CATH LAB;  Service: Cardiovascular;  Laterality: N/A;   OVARY SURGERY     ovarian cancer   POLYPECTOMY  03/09/2019   Procedure: POLYPECTOMY;  Surgeon: Rogene Houston, MD;  [facility];  Service: Endoscopy;;  cecal    POLYPECTOMY N/A 09/26/2019   Procedure: DUODENAL POLYPECTOMY;  Surgeon: Rogene Houston, MD;  [facility];  Service: Endoscopy;  Laterality: N/A;   REVISION OF ARTERIOVENOUS GORETEX GRAFT N/A 02/24/2017   Procedure: REVISION OF ARTERIOVENOUS GORETEX GRAFT (RESECTION);  Surgeon: Katha Cabal, MD;  Location: [facility] OR[facility];  Service: Vascular;  Laterality: N/A;   REVISON OF ARTERIOVENOUS FISTULA Left  06/19/2020   Procedure: REVISION OF LEFT UPPER ARM AV GRAFT WITH INTERPOSITION JUMP GRAFT USING 6MM GORE LIMB;  Surgeon: Marty Heck, MD;  Location: [place];  Service: Vascular;  Laterality: Left;   SHUNTOGRAM N/A 10/15/2013   Procedure: Fistulogram;  Surgeon: Serafina Mitchell, MD;  Location: [facility] CATH LAB;  Service: Cardiovascular;  Laterality: N/A;   THROMBECTOMY / ARTERIOVENOUS GRAFT REVISION  2011   left upper arm   TUBAL LIGATION  1980's   UPPER EXTREMITY ANGIOGRAPHY Bilateral 12/06/2016   Procedure: Upper Extremity Angiography;  Surgeon: Katha Cabal, MD;  Location: [place] [facility];  Service: Cardiovascular;  Laterality: Bilateral;   UPPER EXTREMITY INTERVENTION Left 06/06/2017   Procedure: UPPER EXTREMITY INTERVENTION;  Surgeon: Katha Cabal, MD;  Location: [place] [facility];  Service: Cardiovascular;  Laterality: Left;     Allergies  Allergen Reactions   Amlodipine Swelling   Aspirin Other (See Comments)    High Doses Mess up her stomach; "makes my bowels have blood in them". Takes 81 mg EC Aspirin    Nitrofurantoin Hives   Bactrim [Sulfamethoxazole-Trimethoprim] Rash   Contrast Media [Iodinated Contrast Media] Itching   Iron Itching and Other (See Comments)    "they gave me iron in dialysis; had to give me Benadryl cause I had to have the iron" (05/02/2012)   Tylenol [Acetaminophen] Itching and Other (See Comments)    Makes her feet on fire per pt   Gabapentin Other (See Comments)    Unknown reaction   Iron Sucrose Other (See Comments)    Unknown   Ranexa [Ranolazine] Other (See Comments)    Myoclonus-hospitalized    Sucroferric Oxyhydroxide Other (See Comments)    Unknown   Dexilant [Dexlansoprazole] Other (See Comments)    Upset stomach   Hydralazine Itching    Has tolerated while inpatient Has tolerated while inpatient   Levaquin [Levofloxacin In D5w] Rash   Morphine And Related Itching and Other (See Comments)    Itching in feet    Pantoprazole Rash   Plavix [Clopidogrel Bisulfate] Rash   Protonix [Pantoprazole Sodium] Rash   Venofer [Ferric Oxide] Itching and Other (See Comments)    Patient reports using Benadryl prior to doses as [place]      Family History  Problem Relation Age of Onset   Heart disease Mother        Heart Disease before age 73   Hyperlipidemia Mother    Hypertension Mother    Diabetes Mother    Heart attack Mother    Heart disease Father        Heart Disease before age 67   Hyperlipidemia Father    Hypertension Father    Diabetes Father    Diabetes Sister    Hypertension Sister    Diabetes Brother    Hyperlipidemia Brother    Heart attack Brother    Hypertension Sister    Heart attack Brother    Colon cancer Child 82   Other Other        noncontributory for early CAD   Esophageal cancer Neg Hx    Liver disease Neg Hx    Kidney disease Neg Hx    Colon polyps Neg Hx  Social History Shawna [name] reports that she has never smoked. She has never used smokeless tobacco. Shawna [name] reports no history of alcohol use.   Review of Systems CONSTITUTIONAL: No weight loss, fever, chills, weakness or fatigue.  HEENT: Eyes: No visual loss, blurred vision, double vision or yellow sclerae.No hearing loss, sneezing, congestion, runny nose or sore throat.  SKIN: No rash or itching.  CARDIOVASCULAR:  RESPIRATORY: No shortness of breath, cough or sputum.  GASTROINTESTINAL: No anorexia, nausea, vomiting or diarrhea. No abdominal pain or blood.  GENITOURINARY: No burning on urination, no polyuria NEUROLOGICAL: No headache, dizziness, syncope, paralysis, ataxia, numbness or tingling in the extremities. No change in bowel or bladder control.  MUSCULOSKELETAL: No muscle, back pain, joint pain or stiffness.  LYMPHATICS: No enlarged nodes. No history of splenectomy.  PSYCHIATRIC: No history of depression or anxiety.  ENDOCRINOLOGIC: No reports of sweating, cold or heat intolerance. No  polyuria or polydipsia.  Marland Kitchen   Physical Examination There were no vitals filed for this visit. There were no vitals filed for this visit.  Gen: resting comfortably, no acute distress HEENT: no scleral icterus, pupils equal round and reactive, no palptable cervical adenopathy,  CV Resp: Clear to auscultation bilaterally GI: abdomen is soft, non-tender, non-distended, normal bowel sounds, no hepatosplenomegaly MSK: extremities are warm, no edema.  Skin: warm, no rash Neuro:  no focal deficits Psych: appropriate affect   Diagnostic Studies     Assessment and Plan  1.CAD - chronic stable symptoms, antianginal therpay has been limited due to low bp's and side effects - recent atypical symptoms though she reports better with SL NG, will try increasing imdur to '90mg'$  daily     2. AFib/PSVT - infrequent palpitations, continue amiodarone and lopressor - not on anticoag due to prior GI bleeding     3. Hyperlipidemia - should be on just crestor '10mg'$  daily, will clarify not also getting simvastatin with her pharmacy.       Arnoldo Lenis, M.D., F.A.C.C.

## 2022-03-04 ENCOUNTER — Ambulatory Visit: Payer: Medicare HMO | Admitting: *Deleted

## 2022-03-04 ENCOUNTER — Encounter: Payer: Self-pay | Admitting: *Deleted

## 2022-03-04 ENCOUNTER — Telehealth: Payer: Self-pay | Admitting: Cardiology

## 2022-03-04 VITALS — BP 138/64 | HR 124

## 2022-03-04 DIAGNOSIS — I471 Supraventricular tachycardia: Secondary | ICD-10-CM

## 2022-03-04 MED ORDER — METOPROLOL TARTRATE 25 MG PO TABS: 12.5000 mg | ORAL_TABLET | Freq: Two times a day (BID) | ORAL | 6 refills | Status: DC

## 2022-03-04 NOTE — Telephone Encounter (Signed)
Mr. Waight walked into the office stating that Shawna [name] needs to get her HR checked. States that she left dialysis and was told that she needed to come by here to make an appointment. Missed last appointment due to being in the hospital .

## 2022-03-04 NOTE — Telephone Encounter (Signed)
Addressed in nurse visit.

## 2022-03-04 NOTE — Progress Notes (Signed)
Patient walked into office c/o elevated heart rate.  States dialysis told her to come here.    Initial pulse ox was 124.  Patient brought to room -vitals & EKG done.  Per Dr. Harl Bowie review - restart Lopressor but at lower dose of 12.'5mg'$  twice a day.  Nurse visit on Monday to recheck EKG & vitals.    Patient & husband notified.  New prescription sent to [facility] now.    Nurse visit scheduled for Monday, 03/07/2022 at 9:00 am here in [place] office.

## 2022-03-07 ENCOUNTER — Ambulatory Visit: Payer: Medicare HMO | Attending: Cardiology | Admitting: *Deleted

## 2022-03-07 VITALS — BP 128/52 | HR 63 | Wt 128.4 lb

## 2022-03-07 DIAGNOSIS — I471 Supraventricular tachycardia: Secondary | ICD-10-CM | POA: Diagnosis not present

## 2022-03-07 NOTE — Progress Notes (Unsigned)
Patient in office for f/u - EKG & vitals.  Has taken all morning meds.    She missed her 4 mo f/u due to being in the hospital.  When do you want to see her back ?

## 2022-03-08 NOTE — Progress Notes (Signed)
3 months  Shawna Abts MD

## 2022-03-10 NOTE — Progress Notes (Signed)
Noted.    Appointment scheduled & mailed to patient.

## 2022-04-15 ENCOUNTER — Other Ambulatory Visit: Payer: Self-pay

## 2022-04-15 ENCOUNTER — Emergency Department (HOSPITAL_COMMUNITY)
Admission: EM | Admit: 2022-04-15 | Discharge: 2022-04-15 | Disposition: A | Discharge status: Home / Self Care | Payer: Medicare HMO | Attending: Emergency Medicine | Admitting: Emergency Medicine

## 2022-04-15 ENCOUNTER — Encounter (HOSPITAL_COMMUNITY): Payer: Self-pay | Admitting: *Deleted

## 2022-04-15 DIAGNOSIS — Z79899 Other long term (current) drug therapy: Secondary | ICD-10-CM | POA: Diagnosis not present

## 2022-04-15 DIAGNOSIS — I4891 Unspecified atrial fibrillation: Secondary | ICD-10-CM | POA: Insufficient documentation

## 2022-04-15 DIAGNOSIS — D649 Anemia, unspecified: Secondary | ICD-10-CM | POA: Diagnosis not present

## 2022-04-15 DIAGNOSIS — R109 Unspecified abdominal pain: Secondary | ICD-10-CM | POA: Diagnosis present

## 2022-04-15 LAB — CBC WITH DIFFERENTIAL/PLATELET
Abs Immature Granulocytes: 0.05 10*3/uL (ref 0.00–0.07)
Basophils Absolute: 0.1 10*3/uL (ref 0.0–0.1)
Basophils Relative: 1 %
Eosinophils Absolute: 0.3 10*3/uL (ref 0.0–0.5)
Eosinophils Relative: 3 %
HCT: 24.5 % — ABNORMAL LOW (ref 36.0–46.0)
Hemoglobin: 7.8 g/dL — ABNORMAL LOW (ref 12.0–15.0)
Immature Granulocytes: 1 %
Lymphocytes Relative: 8 %
Lymphs Abs: 0.7 10*3/uL (ref 0.7–4.0)
MCH: 38.8 pg — ABNORMAL HIGH (ref 26.0–34.0)
MCHC: 31.8 g/dL (ref 30.0–36.0)
MCV: 121.9 fL — ABNORMAL HIGH (ref 80.0–100.0)
Monocytes Absolute: 1 10*3/uL (ref 0.1–1.0)
Monocytes Relative: 12 %
Neutro Abs: 6.6 10*3/uL (ref 1.7–7.7)
Neutrophils Relative %: 75 %
Platelets: 234 10*3/uL (ref 150–400)
RBC: 2.01 MIL/uL — ABNORMAL LOW (ref 3.87–5.11)
RDW: 16 % — ABNORMAL HIGH (ref 11.5–15.5)
WBC: 8.7 10*3/uL (ref 4.0–10.5)
nRBC: 0 % (ref 0.0–0.2)

## 2022-04-15 LAB — COMPREHENSIVE METABOLIC PANEL
ALT: 12 U/L (ref 0–44)
AST: 9 U/L — ABNORMAL LOW (ref 15–41)
Albumin: 3.6 g/dL (ref 3.5–5.0)
Alkaline Phosphatase: 115 U/L (ref 38–126)
Anion gap: 12 (ref 5–15)
BUN: 33 mg/dL — ABNORMAL HIGH (ref 8–23)
CO2: 27 mmol/L (ref 22–32)
Calcium: 9 mg/dL (ref 8.9–10.3)
Chloride: 97 mmol/L — ABNORMAL LOW (ref 98–111)
Creatinine, Ser: 5.43 mg/dL — ABNORMAL HIGH (ref 0.44–1.00)
GFR, Estimated: 7 mL/min — ABNORMAL LOW (ref >60)
Glucose, Bld: 116 mg/dL — ABNORMAL HIGH (ref 70–99)
Potassium: 3.5 mmol/L (ref 3.5–5.1)
Sodium: 136 mmol/L (ref 135–145)
Total Bilirubin: 0.6 mg/dL (ref 0.3–1.2)
Total Protein: 7.1 g/dL (ref 6.5–8.1)

## 2022-04-15 LAB — TYPE AND SCREEN
ABO/RH(D): O POS
Antibody Screen: NEGATIVE

## 2022-04-15 NOTE — ED Provider Notes (Signed)
[facility] EMERGENCY DEPARTMENT Provider Note   CSN: [id number] Arrival date & time: 04/15/22  1611     History  Chief Complaint  Patient presents with   Abnormal Lab    Shawna [name] is a 81 y.o. female.   Abnormal Lab    Patient is an 81 year old female, she has a history of atrial fibrillation on amiodarone, she also is on midodrine for hypotension rosuvastatin for cholesterol and is a dialysis patient.  She was at dialysis today when she noted that her hemoglobin is very low.  Reportedly it was 6.8 and she was sent here for evaluation.  The patient does not have any other symptoms, she has not seen blood in her stools, she has a history of recurrent anemia and has required blood transfusions in the past though not recently.  The patient has no other symptoms at this time.  She is not anticoagulated  Home Medications Prior to Admission medications   Medication Sig Start Date End Date Taking? Authorizing Provider  amiodarone (PACERONE) 100 MG tablet Take 2 tablets (200 mg total) by mouth daily. 11/23/21   Ghimire, Henreitta Leber, MD  cyclobenzaprine (FLEXERIL) 5 MG tablet Take 5 mg by mouth 3 (three) times daily as needed. 12/27/21   [provider]  hydrOXYzine (VISTARIL) 100 MG capsule Take 100 mg by mouth at bedtime. 12/13/21   [provider]  levothyroxine (SYNTHROID) 25 MCG tablet Take 1 tablet (25 mcg total) by mouth daily. 02/02/21   Brita Romp, NP  metoprolol tartrate (LOPRESSOR) 25 MG tablet Take 0.5 tablets (12.5 mg total) by mouth 2 (two) times daily. 03/04/22   Arnoldo Lenis, MD  midodrine (PROAMATINE) 5 MG tablet Take 5-10 mg by mouth See admin instructions. 3 times a week on dialysis days 12/10/21   [provider]  multivitamin (RENA-VIT) TABS tablet Take 1 tablet by mouth daily.    [provider]  omeprazole (PRILOSEC) 20 MG capsule Take 1 capsule (20 mg total) by mouth daily. Patient taking differently: Take 40 mg by mouth  daily as needed (acid reflux). 12/01/20   Rehman, Mechele Dawley, MD  rosuvastatin (CRESTOR) 10 MG tablet Take 1 tablet (10 mg total) by mouth daily. 05/13/21   Strader, Fransisco Hertz, PA-C  sevelamer carbonate (RENVELA) 800 MG tablet Take 2,400 mg by mouth 3 (three) times daily. 12/27/21   [provider]  vitamin B-6 (B-6) 100 MG tablet Take 1 tablet (100 mg total) by mouth daily. Patient not taking: Reported on 01/04/2022 11/24/21   Jonetta Osgood, MD  vitamin E 180 MG (400 UNITS) capsule Take 1 capsule (400 Units total) by mouth daily. Patient not taking: Reported on 01/04/2022 11/24/21   Jonetta Osgood, MD      Allergies    Amlodipine, Aspirin, Nitrofurantoin, Bactrim [sulfamethoxazole-trimethoprim], Contrast media [iodinated contrast media], Iron, Tylenol [acetaminophen], Gabapentin, Iron sucrose, Ranexa [ranolazine], Sucroferric oxyhydroxide, Dexilant [dexlansoprazole], Hydralazine, Levaquin [levofloxacin in d5w], Morphine and related, Pantoprazole, Plavix [clopidogrel bisulfate], Protonix [pantoprazole sodium], and Venofer [ferric oxide]    Review of Systems   Review of Systems  All other systems reviewed and are negative.   Physical Exam Updated Vital Signs BP (!) 153/45   Pulse (!) 52   Temp 98 F (36.7 C) (Oral)   Resp 17   Ht 1.549 m ('5\' 1"'$ )   SpO2 99%   BMI 24.26 kg/m  Physical Exam Vitals and nursing note reviewed.  Constitutional:      General: She is  not in acute distress.    Appearance: She is well-developed.  HENT:     Head: Normocephalic and atraumatic.     Mouth/Throat:     Pharynx: No oropharyngeal exudate.  Eyes:     General: No scleral icterus.       Right eye: No discharge.        Left eye: No discharge.     Conjunctiva/sclera: Conjunctivae normal.     Pupils: Pupils are equal, round, and reactive to light.  Neck:     Thyroid: No thyromegaly.     Vascular: No JVD.  Cardiovascular:     Rate and Rhythm: Normal rate and regular rhythm.     Heart  sounds: Normal heart sounds. No murmur heard.    No friction rub. No gallop.  Pulmonary:     Effort: Pulmonary effort is normal. No respiratory distress.     Breath sounds: Normal breath sounds. No wheezing or rales.  Abdominal:     General: Bowel sounds are normal. There is no distension.     Palpations: Abdomen is soft. There is no mass.     Tenderness: There is no abdominal tenderness.  Musculoskeletal:        General: No tenderness. Normal range of motion.     Cervical back: Normal range of motion and neck supple.     Right lower leg: No edema.     Left lower leg: No edema.  Lymphadenopathy:     Cervical: No cervical adenopathy.  Skin:    General: Skin is warm and dry.     Findings: No erythema or rash.  Neurological:     Mental Status: She is alert.     Coordination: Coordination normal.  Psychiatric:        Behavior: Behavior normal.     ED Results / Procedures / Treatments   Labs (all labs ordered are listed, but only abnormal results are displayed) Labs Reviewed  CBC WITH DIFFERENTIAL/PLATELET - Abnormal; Notable for the following components:      Result Value   RBC 2.01 (*)    Hemoglobin 7.8 (*)    HCT 24.5 (*)    MCV 121.9 (*)    MCH 38.8 (*)    RDW 16.0 (*)    All other components within normal limits  COMPREHENSIVE METABOLIC PANEL - Abnormal; Notable for the following components:   Chloride 97 (*)    Glucose, Bld 116 (*)    BUN 33 (*)    Creatinine, Ser 5.43 (*)    AST 9 (*)    GFR, Estimated 7 (*)    All other components within normal limits  TYPE AND SCREEN    EKG None  Radiology No results found.  Procedures Procedures    Medications Ordered in ED Medications - No data to display  ED Course/ Medical Decision Making/ A&P                           Medical Decision Making Amount and/or Complexity of Data Reviewed Labs: ordered.   This patient presents to the ED for concern of probable anemia, this involves an extensive number of  treatment options, and is a complaint that carries with it a high risk of complications and morbidity.  The differential diagnosis includes anemia, this may have been a lab abnormality, thankfully the patient does not have any significant or severe symptoms at this time but will need to have an evaluation for recurrent  anemia.  If she is truly less than 7 she may need a transfusion   Co morbidities that complicate the patient evaluation  End-stage renal disease on dialysis Recurrent anemia   Additional history obtained:  Additional history obtained from chart medical record External records from outside source obtained and reviewed including prior admissions and prior labs, last hemoglobin was over 8   Lab Tests:  I Ordered, and personally interpreted labs.  The pertinent results include: CBC with a red blood count of 7.8, reassuring and similar to prior blood counts   Cardiac Monitoring: / EKG:  The patient was maintained on a cardiac monitor.  I personally viewed and interpreted the cardiac monitored which showed an underlying rhythm of: No tachycardia, normal sinus rhythm in fact he is has a mild bradycardia at 55 bpm   Consultations not needed   Problem List / ED Course / Critical interventions / Medication management  Anemia seems chronic, she is asymptomatic, not in need of an acute transfusion I have reviewed the patients home medicines and have made adjustments as needed   Social Determinants of Health:  Dialysis   Test / Admission - Considered:  Considered admission but the patient is very stable for discharge         Final Clinical Impression(s) / ED Diagnoses Final diagnoses:  Chronic anemia    Rx / DC Orders ED Discharge Orders     None         Noemi Chapel, MD 04/15/22 1844

## 2022-04-15 NOTE — ED Triage Notes (Signed)
Pt sent here for low hemoglobin. Denies sob, had some CP yesterday. Had hemodialysis today.

## 2022-04-15 NOTE — Discharge Instructions (Signed)
Thankfully your testing shows that your blood counts were 7.8, this is very close to what they have been 2 months ago and thankfully it means that you do not need a blood transfusion however if you start to have more symptoms such as shortness of breath, dizziness or if you are starting to see bleeding you will need to be reevaluated immediately by your doctor or the ER.  Otherwise please have the dialysis center recheck your blood counts the next time you go to dialysis on Monday.

## 2022-04-20 ENCOUNTER — Encounter (HOSPITAL_COMMUNITY): Payer: Self-pay | Admitting: Emergency Medicine

## 2022-04-20 ENCOUNTER — Other Ambulatory Visit: Payer: Self-pay

## 2022-04-20 ENCOUNTER — Inpatient Hospital Stay (HOSPITAL_COMMUNITY)
Admission: EM | Admit: 2022-04-20 | Discharge: 2022-04-26 | DRG: 291 | Disposition: A | Discharge status: Home Health | Payer: Medicare HMO | Attending: Family Medicine | Admitting: Family Medicine

## 2022-04-20 ENCOUNTER — Emergency Department (HOSPITAL_COMMUNITY): Payer: Medicare HMO

## 2022-04-20 DIAGNOSIS — K5520 Angiodysplasia of colon without hemorrhage: Secondary | ICD-10-CM | POA: Diagnosis present

## 2022-04-20 DIAGNOSIS — Z79890 Hormone replacement therapy (postmenopausal): Secondary | ICD-10-CM

## 2022-04-20 DIAGNOSIS — T462X5A Adverse effect of other antidysrhythmic drugs, initial encounter: Secondary | ICD-10-CM | POA: Diagnosis present

## 2022-04-20 DIAGNOSIS — Z882 Allergy status to sulfonamides status: Secondary | ICD-10-CM

## 2022-04-20 DIAGNOSIS — F419 Anxiety disorder, unspecified: Secondary | ICD-10-CM | POA: Diagnosis present

## 2022-04-20 DIAGNOSIS — I132 Hypertensive heart and chronic kidney disease with heart failure and with stage 5 chronic kidney disease, or end stage renal disease: Principal | ICD-10-CM | POA: Diagnosis present

## 2022-04-20 DIAGNOSIS — D649 Anemia, unspecified: Secondary | ICD-10-CM

## 2022-04-20 DIAGNOSIS — Y828 Other medical devices associated with adverse incidents: Secondary | ICD-10-CM | POA: Diagnosis present

## 2022-04-20 DIAGNOSIS — G253 Myoclonus: Secondary | ICD-10-CM | POA: Diagnosis present

## 2022-04-20 DIAGNOSIS — N2581 Secondary hyperparathyroidism of renal origin: Secondary | ICD-10-CM | POA: Diagnosis present

## 2022-04-20 DIAGNOSIS — E7800 Pure hypercholesterolemia, unspecified: Secondary | ICD-10-CM | POA: Diagnosis present

## 2022-04-20 DIAGNOSIS — Z8543 Personal history of malignant neoplasm of ovary: Secondary | ICD-10-CM

## 2022-04-20 DIAGNOSIS — I5042 Chronic combined systolic (congestive) and diastolic (congestive) heart failure: Secondary | ICD-10-CM

## 2022-04-20 DIAGNOSIS — I2510 Atherosclerotic heart disease of native coronary artery without angina pectoris: Secondary | ICD-10-CM | POA: Diagnosis present

## 2022-04-20 DIAGNOSIS — Z951 Presence of aortocoronary bypass graft: Secondary | ICD-10-CM

## 2022-04-20 DIAGNOSIS — R053 Chronic cough: Secondary | ICD-10-CM | POA: Diagnosis present

## 2022-04-20 DIAGNOSIS — K219 Gastro-esophageal reflux disease without esophagitis: Secondary | ICD-10-CM | POA: Diagnosis present

## 2022-04-20 DIAGNOSIS — E875 Hyperkalemia: Secondary | ICD-10-CM | POA: Diagnosis not present

## 2022-04-20 DIAGNOSIS — Z8711 Personal history of peptic ulcer disease: Secondary | ICD-10-CM

## 2022-04-20 DIAGNOSIS — R471 Dysarthria and anarthria: Secondary | ICD-10-CM | POA: Diagnosis present

## 2022-04-20 DIAGNOSIS — R001 Bradycardia, unspecified: Secondary | ICD-10-CM | POA: Diagnosis present

## 2022-04-20 DIAGNOSIS — F05 Delirium due to known physiological condition: Secondary | ICD-10-CM | POA: Diagnosis present

## 2022-04-20 DIAGNOSIS — I953 Hypotension of hemodialysis: Secondary | ICD-10-CM | POA: Diagnosis present

## 2022-04-20 DIAGNOSIS — T82318A Breakdown (mechanical) of other vascular grafts, initial encounter: Secondary | ICD-10-CM | POA: Diagnosis present

## 2022-04-20 DIAGNOSIS — Z886 Allergy status to analgesic agent status: Secondary | ICD-10-CM

## 2022-04-20 DIAGNOSIS — E878 Other disorders of electrolyte and fluid balance, not elsewhere classified: Secondary | ICD-10-CM | POA: Diagnosis present

## 2022-04-20 DIAGNOSIS — Z8701 Personal history of pneumonia (recurrent): Secondary | ICD-10-CM

## 2022-04-20 DIAGNOSIS — Z91041 Radiographic dye allergy status: Secondary | ICD-10-CM

## 2022-04-20 DIAGNOSIS — I480 Paroxysmal atrial fibrillation: Secondary | ICD-10-CM | POA: Diagnosis present

## 2022-04-20 DIAGNOSIS — Z955 Presence of coronary angioplasty implant and graft: Secondary | ICD-10-CM

## 2022-04-20 DIAGNOSIS — Z8774 Personal history of (corrected) congenital malformations of heart and circulatory system: Secondary | ICD-10-CM

## 2022-04-20 DIAGNOSIS — Z7982 Long term (current) use of aspirin: Secondary | ICD-10-CM

## 2022-04-20 DIAGNOSIS — T424X5A Adverse effect of benzodiazepines, initial encounter: Secondary | ICD-10-CM | POA: Diagnosis present

## 2022-04-20 DIAGNOSIS — Z888 Allergy status to other drugs, medicaments and biological substances status: Secondary | ICD-10-CM

## 2022-04-20 DIAGNOSIS — Z8673 Personal history of transient ischemic attack (TIA), and cerebral infarction without residual deficits: Secondary | ICD-10-CM

## 2022-04-20 DIAGNOSIS — E871 Hypo-osmolality and hyponatremia: Secondary | ICD-10-CM | POA: Diagnosis present

## 2022-04-20 DIAGNOSIS — Z992 Dependence on renal dialysis: Secondary | ICD-10-CM

## 2022-04-20 DIAGNOSIS — R29704 NIHSS score 4: Secondary | ICD-10-CM | POA: Diagnosis present

## 2022-04-20 DIAGNOSIS — I447 Left bundle-branch block, unspecified: Secondary | ICD-10-CM | POA: Diagnosis present

## 2022-04-20 DIAGNOSIS — R627 Adult failure to thrive: Secondary | ICD-10-CM

## 2022-04-20 DIAGNOSIS — E785 Hyperlipidemia, unspecified: Secondary | ICD-10-CM | POA: Diagnosis present

## 2022-04-20 DIAGNOSIS — Z79899 Other long term (current) drug therapy: Secondary | ICD-10-CM

## 2022-04-20 DIAGNOSIS — Z8679 Personal history of other diseases of the circulatory system: Secondary | ICD-10-CM

## 2022-04-20 DIAGNOSIS — N186 End stage renal disease: Secondary | ICD-10-CM | POA: Diagnosis not present

## 2022-04-20 DIAGNOSIS — M109 Gout, unspecified: Secondary | ICD-10-CM | POA: Diagnosis present

## 2022-04-20 DIAGNOSIS — Z83438 Family history of other disorder of lipoprotein metabolism and other lipidemia: Secondary | ICD-10-CM

## 2022-04-20 DIAGNOSIS — Z8249 Family history of ischemic heart disease and other diseases of the circulatory system: Secondary | ICD-10-CM

## 2022-04-20 DIAGNOSIS — I252 Old myocardial infarction: Secondary | ICD-10-CM

## 2022-04-20 DIAGNOSIS — R410 Disorientation, unspecified: Secondary | ICD-10-CM

## 2022-04-20 DIAGNOSIS — I5022 Chronic systolic (congestive) heart failure: Secondary | ICD-10-CM | POA: Diagnosis present

## 2022-04-20 DIAGNOSIS — E039 Hypothyroidism, unspecified: Secondary | ICD-10-CM | POA: Diagnosis present

## 2022-04-20 DIAGNOSIS — Z800 Family history of malignant neoplasm of digestive organs: Secondary | ICD-10-CM

## 2022-04-20 DIAGNOSIS — G931 Anoxic brain damage, not elsewhere classified: Secondary | ICD-10-CM | POA: Diagnosis not present

## 2022-04-20 DIAGNOSIS — Z833 Family history of diabetes mellitus: Secondary | ICD-10-CM

## 2022-04-20 DIAGNOSIS — Z85038 Personal history of other malignant neoplasm of large intestine: Secondary | ICD-10-CM

## 2022-04-20 DIAGNOSIS — I4821 Permanent atrial fibrillation: Secondary | ICD-10-CM | POA: Diagnosis present

## 2022-04-20 DIAGNOSIS — Z885 Allergy status to narcotic agent status: Secondary | ICD-10-CM

## 2022-04-20 DIAGNOSIS — G928 Other toxic encephalopathy: Secondary | ICD-10-CM | POA: Diagnosis present

## 2022-04-20 DIAGNOSIS — D631 Anemia in chronic kidney disease: Secondary | ICD-10-CM | POA: Diagnosis present

## 2022-04-20 DIAGNOSIS — R531 Weakness: Principal | ICD-10-CM

## 2022-04-20 DIAGNOSIS — E782 Mixed hyperlipidemia: Secondary | ICD-10-CM | POA: Diagnosis present

## 2022-04-20 DIAGNOSIS — D62 Acute posthemorrhagic anemia: Secondary | ICD-10-CM | POA: Diagnosis present

## 2022-04-20 LAB — TROPONIN I (HIGH SENSITIVITY)
Troponin I (High Sensitivity): 26 ng/L — ABNORMAL HIGH (ref <18)
Troponin I (High Sensitivity): 27 ng/L — ABNORMAL HIGH (ref <18)

## 2022-04-20 LAB — HEPATIC FUNCTION PANEL
ALT: 14 U/L (ref 0–44)
AST: 14 U/L — ABNORMAL LOW (ref 15–41)
Albumin: 3.5 g/dL (ref 3.5–5.0)
Alkaline Phosphatase: 98 U/L (ref 38–126)
Bilirubin, Direct: 0.2 mg/dL (ref 0.0–0.2)
Indirect Bilirubin: 0.3 mg/dL (ref 0.3–0.9)
Total Bilirubin: 0.5 mg/dL (ref 0.3–1.2)
Total Protein: 7.3 g/dL (ref 6.5–8.1)

## 2022-04-20 LAB — CBG MONITORING, ED: Glucose-Capillary: 95 mg/dL (ref 70–99)

## 2022-04-20 LAB — BASIC METABOLIC PANEL
Anion gap: 12 (ref 5–15)
BUN: 33 mg/dL — ABNORMAL HIGH (ref 8–23)
CO2: 25 mmol/L (ref 22–32)
Calcium: 8.4 mg/dL — ABNORMAL LOW (ref 8.9–10.3)
Chloride: 97 mmol/L — ABNORMAL LOW (ref 98–111)
Creatinine, Ser: 5.75 mg/dL — ABNORMAL HIGH (ref 0.44–1.00)
GFR, Estimated: 7 mL/min — ABNORMAL LOW (ref >60)
Glucose, Bld: 110 mg/dL — ABNORMAL HIGH (ref 70–99)
Potassium: 3.7 mmol/L (ref 3.5–5.1)
Sodium: 134 mmol/L — ABNORMAL LOW (ref 135–145)

## 2022-04-20 LAB — CBC
HCT: 20.9 % — ABNORMAL LOW (ref 36.0–46.0)
Hemoglobin: 6.6 g/dL — CL (ref 12.0–15.0)
MCH: 38.4 pg — ABNORMAL HIGH (ref 26.0–34.0)
MCHC: 31.6 g/dL (ref 30.0–36.0)
MCV: 121.5 fL — ABNORMAL HIGH (ref 80.0–100.0)
Platelets: 135 10*3/uL — ABNORMAL LOW (ref 150–400)
RBC: 1.72 MIL/uL — ABNORMAL LOW (ref 3.87–5.11)
RDW: 15.3 % (ref 11.5–15.5)
WBC: 8.2 10*3/uL (ref 4.0–10.5)
nRBC: 0 % (ref 0.0–0.2)

## 2022-04-20 LAB — PREPARE RBC (CROSSMATCH)

## 2022-04-20 MED ORDER — SODIUM CHLORIDE 0.9 % IV SOLN
10.0000 mL/h | Freq: Once | INTRAVENOUS | Status: AC
  Administered 2022-04-21: 10 mL/h via INTRAVENOUS

## 2022-04-20 NOTE — ED Notes (Signed)
Pt. Doesn't make urine

## 2022-04-20 NOTE — ED Provider Notes (Addendum)
[facility] EMERGENCY DEPARTMENT Provider Note   CSN: [id number] Arrival date & time: 04/20/22  1728     History  Chief Complaint  Patient presents with   Weakness    Shawna [name] is a 81 y.o. female.   Weakness Patient presents for generalized weakness.  Medical history includes CAD, HTN, colon cancer, ovarian cancer, HLD, GERD, small bowel AVM, anemia, ESRD (on HD M, W, F), CHF, atrial fibrillation, anxiety, gout.  She was seen in the ED 5 days ago for concern of acute on chronic anemia.  At that time, hemoglobin was 7.8, similar to baseline.  She reported to be asymptomatic at that time.  Since she was last seen, patient has not had any known blood loss.  She has continued to go to her scheduled dialysis, most recent of which was today.  She has, however, had continued generalized weakness.  This limits her mobility.  At baseline, patient can ambulate with a cane.  Over the past week, she has required assistance from her husband.  After she got home from her dialysis, she seemed to have even worsened generalized weakness when husband was getting her into the home.  For this reason, patient presents to the ED.  Although she has been endorsing intermittent back pain she denies any currently.  She denies any shortness of breath, chest discomfort, abdominal pain, or nausea.  She has not had any recent vomiting or diarrhea.     Home Medications Prior to Admission medications   Medication Sig Start Date End Date Taking? Authorizing Provider  ALPRAZolam Duanne Moron) 0.5 MG tablet Take 0.5 mg by mouth daily as needed. 04/12/22  Yes [provider]  amiodarone (PACERONE) 100 MG tablet Take 2 tablets (200 mg total) by mouth daily. 11/23/21  Yes Ghimire, Henreitta Leber, MD  Aspirin 81 MG CAPS Take 1 capsule by mouth daily at 12 noon.   Yes [provider]  cyclobenzaprine (FLEXERIL) 5 MG tablet Take 5 mg by mouth 3 (three) times daily as needed. 12/27/21  Yes [provider]   hydrOXYzine (VISTARIL) 100 MG capsule Take 100 mg by mouth at bedtime. 12/13/21  Yes [provider]  levothyroxine (SYNTHROID) 25 MCG tablet Take 1 tablet (25 mcg total) by mouth daily. 02/02/21  Yes Brita Romp, NP  metoprolol tartrate (LOPRESSOR) 25 MG tablet Take 0.5 tablets (12.5 mg total) by mouth 2 (two) times daily. 03/04/22  Yes Branch, Alphonse Guild, MD  midodrine (PROAMATINE) 5 MG tablet Take 5-10 mg by mouth See admin instructions. 3 times a week on dialysis days 12/10/21  Yes [provider]  multivitamin (RENA-VIT) TABS tablet Take 1 tablet by mouth daily.   Yes [provider]  nitroGLYCERIN (NITROSTAT) 0.4 MG SL tablet Place 0.4 mg under the tongue every 5 (five) minutes as needed for chest pain.   Yes [provider]  omeprazole (PRILOSEC) 20 MG capsule Take 1 capsule (20 mg total) by mouth daily. Patient taking differently: Take 40 mg by mouth daily as needed (acid reflux). 12/01/20  Yes Rehman, Mechele Dawley, MD  rosuvastatin (CRESTOR) 10 MG tablet Take 1 tablet (10 mg total) by mouth daily. 05/13/21  Yes Strader, [name], PA-C  sevelamer carbonate (RENVELA) 800 MG tablet Take 2,400 mg by mouth 3 (three) times daily. 12/27/21  Yes [provider]      Allergies    Amlodipine, Aspirin, Nitrofurantoin, Bactrim [sulfamethoxazole-trimethoprim], Contrast media [iodinated contrast media], Iron, Tylenol [acetaminophen], Gabapentin, Iron sucrose, Ranexa [ranolazine], Sucroferric  oxyhydroxide, Dexilant [dexlansoprazole], Hydralazine, Levaquin [levofloxacin in d5w], Morphine and related, Pantoprazole, Plavix [clopidogrel bisulfate], Protonix [pantoprazole sodium], and Venofer [ferric oxide]    Review of Systems   Review of Systems  Constitutional:  Positive for fatigue.  Neurological:  Positive for weakness (Generalized).  All other systems reviewed and are negative.   Physical Exam Updated Vital Signs BP (!) 142/41   Pulse (!) 45   Temp 98.1  F (36.7 C) (Oral)   Resp 19   Ht '5\' 1"'$  (1.549 m)   Wt 54.4 kg   SpO2 97%   BMI 22.67 kg/m  Physical Exam Vitals and nursing note reviewed.  Constitutional:      General: She is not in acute distress.    Appearance: She is well-developed. She is ill-appearing (Chronically). She is not toxic-appearing or diaphoretic.  HENT:     Head: Normocephalic and atraumatic.     Right Ear: External ear normal.     Left Ear: External ear normal.     Nose: Nose normal.     Mouth/Throat:     Mouth: Mucous membranes are moist.     Pharynx: Oropharynx is clear.  Eyes:     Extraocular Movements: Extraocular movements intact.     Conjunctiva/sclera: Conjunctivae normal.  Cardiovascular:     Rate and Rhythm: Regular rhythm. Bradycardia present.     Heart sounds: No murmur heard. Pulmonary:     Effort: Pulmonary effort is normal. No respiratory distress.     Breath sounds: No wheezing or rales.  Abdominal:     General: There is no distension.     Palpations: Abdomen is soft.     Tenderness: There is no abdominal tenderness.  Musculoskeletal:        General: No swelling. Normal range of motion.     Cervical back: Normal range of motion and neck supple.     Right lower leg: No edema.     Left lower leg: No edema.  Skin:    General: Skin is warm and dry.     Capillary Refill: Capillary refill takes less than 2 seconds.     Coloration: Skin is not jaundiced or pale.  Neurological:     General: No focal deficit present.     Mental Status: She is alert and oriented to person, place, and time.     Cranial Nerves: No cranial nerve deficit.     Sensory: No sensory deficit.     Motor: No weakness.     Coordination: Coordination normal.  Psychiatric:        Mood and Affect: Mood normal.        Behavior: Behavior normal.        Thought Content: Thought content normal.        Judgment: Judgment normal.     ED Results / Procedures / Treatments   Labs (all labs ordered are listed, but only  abnormal results are displayed) Labs Reviewed  BASIC METABOLIC PANEL - Abnormal; Notable for the following components:      Result Value   Sodium 134 (*)    Chloride 97 (*)    Glucose, Bld 110 (*)    BUN 33 (*)    Creatinine, Ser 5.75 (*)    Calcium 8.4 (*)    GFR, Estimated 7 (*)    All other components within normal limits  CBC - Abnormal; Notable for the following components:   RBC 1.72 (*)    Hemoglobin 6.6 (*)    HCT  20.9 (*)    MCV 121.5 (*)    MCH 38.4 (*)    Platelets 135 (*)    All other components within normal limits  HEPATIC FUNCTION PANEL - Abnormal; Notable for the following components:   AST 14 (*)    All other components within normal limits  TROPONIN I (HIGH SENSITIVITY) - Abnormal; Notable for the following components:   Troponin I (High Sensitivity) 26 (*)    All other components within normal limits  TROPONIN I (HIGH SENSITIVITY) - Abnormal; Notable for the following components:   Troponin I (High Sensitivity) 27 (*)    All other components within normal limits  URINALYSIS, ROUTINE W REFLEX MICROSCOPIC  CBC WITH DIFFERENTIAL/PLATELET  VITAMIN B12  FOLATE  RETICULOCYTES  CBG MONITORING, ED  POC OCCULT BLOOD, ED  TYPE AND SCREEN  PREPARE RBC (CROSSMATCH)    EKG EKG Interpretation  Date/Time:  Wednesday April 20 2022 18:16:39 EDT Ventricular Rate:  51 PR Interval:  178 QRS Duration: 134 QT Interval:  604 QTC Calculation: 556 R Axis:   -12 Text Interpretation: Sinus bradycardia Left bundle branch block Abnormal ECG Confirmed by Godfrey Pick [id number]) on 04/20/2022 7:03:27 PM  Radiology DG Chest Portable 1 View  Result Date: 04/20/2022 CLINICAL DATA:  Generalized weakness EXAM: PORTABLE CHEST 1 VIEW COMPARISON:  01/18/2022 FINDINGS: Transverse diameter of heart is increased. Coronary artery calcifications and possible stents are noted. There is previous coronary artery bypass surgery. There are no signs of pulmonary edema or focal pulmonary  consolidation. There are small linear densities in both lower lung fields suggesting scarring or subsegmental atelectasis. There is deformity in the right humeral head, possibly due to severe degenerative changes or avascular necrosis. Vascular stents are noted in the left upper arm. IMPRESSION: Cardiomegaly. Coronary artery disease. There are no signs of pulmonary edema or focal pulmonary consolidation. Small linear densities in lower lung fields may suggest scarring or subsegmental atelectasis. Electronically Signed   By: Elmer Picker M.D.   On: 04/20/2022 20:04    Procedures Procedures    Medications Ordered in ED Medications  0.9 %  sodium chloride infusion (has no administration in time range)    ED Course/ Medical Decision Making/ A&P                           Medical Decision Making Amount and/or Complexity of Data Reviewed Labs: ordered. Radiology: ordered.  Risk Prescription drug management. Decision regarding hospitalization.   This patient presents to the ED for concern of generalized weakness, this involves an extensive number of treatment options, and is a complaint that carries with it a high risk of complications and morbidity.  The differential diagnosis includes symptomatic anemia, electrolyte abnormalities, infection, dehydration, volume overload, polypharmacy, depression   Co morbidities that complicate the patient evaluation  CAD, HTN, colon cancer, ovarian cancer, HLD, GERD, small bowel AVM, anemia, ESRD (on HD M, W, F), CHF, atrial fibrillation, anxiety, gout   Additional history obtained:  Additional history obtained from patient's husband External records from outside source obtained and reviewed including EMR   Lab Tests:  I Ordered, and personally interpreted labs.  The pertinent results include: Acute on chronic anemia with hemoglobin of 6.6 today.  This has been a steady downtrend over the past several months.  She has also had a steady  uptrend of her MCV, now at 121.5.  Creatinine is elevated consistent with ESRD.  Electrolytes are normal.  She has mild  elevation in troponin consistent with baseline.   Imaging Studies ordered:  I ordered imaging studies including chest x-ray I independently visualized and interpreted imaging which showed no acute findings I agree with the radiologist interpretation   Cardiac Monitoring: / EKG:  The patient was maintained on a cardiac monitor.  I personally viewed and interpreted the cardiac monitored which showed an underlying rhythm of: Sinus bradycardia  Problem List / ED Course / Critical interventions / Medication management  Patient is 81 year old female presenting from home for generalized weakness.  History is provided primarily by her husband.  Patient has had progressive generalized weakness over the past week.  It became severe today following her dialysis session.  She states that she typically does not get weak after dialysis.  She has not missed any dialysis treatments.  She has been informed that she has had a decreasing hemoglobin.  She was seen in the ED last week for this.  At that time, hemoglobin was 7.8.  She has not had any known blood loss.  She denies any dark stools.  She has not had any recent fluid losses.  On exam, patient is resting comfortably.  Vital signs are notable for bradycardia and wide pulse pressure.  Widened pulse pressure is likely secondary to anemia.  She does have a history of bradycardia.  In the past, this was attributed to AV nodal agent.  She was taken off of her beta-blocker but recently restarted on it.  Her breathing is unlabored and she denies any shortness of breath.  I do not appreciate any cardiac murmurs.  She has no current areas of discomfort.  Laboratory work-up was initiated and CBC was notable for a hemoglobin of 6.6.  This shows continued downtrend over the past several months.  4 months ago, her hemoglobin was 11.  She has also had a  increasing MCV.  Etiology of this is indeterminate.  Patient is remaining lab work is unremarkable.  Patient was consented for blood transfusion.  2 units of PRBC were ordered.  Her blood was sent for type and screen.  When crossmatch, she does have increased antibodies and donor blood will not be available till the morning.  Because of this, patient to be admitted to medicine.  Additional lab work was ordered to further evaluate her macrocytic anemia.  She was admitted to medicine for further management. I ordered medication including PRBCs for symptomatic anemia Reevaluation of the patient after these medicines showed that the patient  transfusion pending at time of admission I have reviewed the patients home medicines and have made adjustments as needed   Social Determinants of Health:  Lives at home with husband, has access to outpatient care  CRITICAL CARE Performed by: Godfrey Pick   Total critical care time: 32 minutes  Critical care time was exclusive of separately billable procedures and treating other patients.  Critical care was necessary to treat or prevent imminent or life-threatening deterioration.  Critical care was time spent personally by me on the following activities: development of treatment plan with patient and/or surrogate as well as nursing, discussions with consultants, evaluation of patient's response to treatment, examination of patient, obtaining history from patient or surrogate, ordering and performing treatments and interventions, ordering and review of laboratory studies, ordering and review of radiographic studies, pulse oximetry and re-evaluation of patient's condition.          Final Clinical Impression(s) / ED Diagnoses Final diagnoses:  Generalized weakness  Symptomatic anemia    Rx /  DC Orders ED Discharge Orders     None         Godfrey Pick, MD 04/20/22 Joie Bimler    Godfrey Pick, MD 04/20/22 [phone]

## 2022-04-20 NOTE — ED Triage Notes (Signed)
Pt c/o weakness & low BP x 1 wk. Dialysis pt M, W, F. Denies pain. Seen 10/6 for low hemoglobin per spouse. BP 155/39 in triage.

## 2022-04-21 DIAGNOSIS — D649 Anemia, unspecified: Secondary | ICD-10-CM

## 2022-04-21 LAB — MAGNESIUM: Magnesium: 2.1 mg/dL (ref 1.7–2.4)

## 2022-04-21 LAB — CBC WITH DIFFERENTIAL/PLATELET
Abs Immature Granulocytes: 0.02 10*3/uL (ref 0.00–0.07)
Abs Immature Granulocytes: 0.03 10*3/uL (ref 0.00–0.07)
Basophils Absolute: 0.1 10*3/uL (ref 0.0–0.1)
Basophils Absolute: 0.1 10*3/uL (ref 0.0–0.1)
Basophils Relative: 1 %
Basophils Relative: 1 %
Eosinophils Absolute: 0.4 10*3/uL (ref 0.0–0.5)
Eosinophils Absolute: 0.4 10*3/uL (ref 0.0–0.5)
Eosinophils Relative: 5 %
Eosinophils Relative: 7 %
HCT: 21.4 % — ABNORMAL LOW (ref 36.0–46.0)
HCT: 22 % — ABNORMAL LOW (ref 36.0–46.0)
Hemoglobin: 6.7 g/dL — CL (ref 12.0–15.0)
Hemoglobin: 6.7 g/dL — CL (ref 12.0–15.0)
Immature Granulocytes: 0 %
Immature Granulocytes: 0 %
Lymphocytes Relative: 14 %
Lymphocytes Relative: 9 %
Lymphs Abs: 0.7 10*3/uL (ref 0.7–4.0)
Lymphs Abs: 0.8 10*3/uL (ref 0.7–4.0)
MCH: 37.4 pg — ABNORMAL HIGH (ref 26.0–34.0)
MCH: 38.1 pg — ABNORMAL HIGH (ref 26.0–34.0)
MCHC: 30.5 g/dL (ref 30.0–36.0)
MCHC: 31.3 g/dL (ref 30.0–36.0)
MCV: 121.6 fL — ABNORMAL HIGH (ref 80.0–100.0)
MCV: 122.9 fL — ABNORMAL HIGH (ref 80.0–100.0)
Monocytes Absolute: 0.5 10*3/uL (ref 0.1–1.0)
Monocytes Absolute: 1.1 10*3/uL — ABNORMAL HIGH (ref 0.1–1.0)
Monocytes Relative: 14 %
Monocytes Relative: 9 %
Neutro Abs: 4 10*3/uL (ref 1.7–7.7)
Neutro Abs: 5.4 10*3/uL (ref 1.7–7.7)
Neutrophils Relative %: 69 %
Neutrophils Relative %: 71 %
Platelets: 237 10*3/uL (ref 150–400)
Platelets: 243 10*3/uL (ref 150–400)
RBC: 1.76 MIL/uL — ABNORMAL LOW (ref 3.87–5.11)
RBC: 1.79 MIL/uL — ABNORMAL LOW (ref 3.87–5.11)
RDW: 15.3 % (ref 11.5–15.5)
RDW: 15.4 % (ref 11.5–15.5)
WBC: 5.8 10*3/uL (ref 4.0–10.5)
WBC: 7.6 10*3/uL (ref 4.0–10.5)
nRBC: 0 % (ref 0.0–0.2)
nRBC: 0 % (ref 0.0–0.2)

## 2022-04-21 LAB — IRON AND TIBC
Iron: 58 ug/dL (ref 28–170)
Saturation Ratios: 23 % (ref 10.4–31.8)
TIBC: 255 ug/dL (ref 250–450)
UIBC: 197 ug/dL

## 2022-04-21 LAB — RETICULOCYTES
Immature Retic Fract: 31.4 % — ABNORMAL HIGH (ref 2.3–15.9)
Immature Retic Fract: 36.3 % — ABNORMAL HIGH (ref 2.3–15.9)
RBC.: 1.59 MIL/uL — ABNORMAL LOW (ref 3.87–5.11)
RBC.: 1.73 MIL/uL — ABNORMAL LOW (ref 3.87–5.11)
Retic Count, Absolute: 60.6 10*3/uL (ref 19.0–186.0)
Retic Count, Absolute: 63.3 10*3/uL (ref 19.0–186.0)
Retic Ct Pct: 3.7 % — ABNORMAL HIGH (ref 0.4–3.1)
Retic Ct Pct: 3.8 % — ABNORMAL HIGH (ref 0.4–3.1)

## 2022-04-21 LAB — COMPREHENSIVE METABOLIC PANEL
ALT: 12 U/L (ref 0–44)
AST: 16 U/L (ref 15–41)
Albumin: 3.2 g/dL — ABNORMAL LOW (ref 3.5–5.0)
Alkaline Phosphatase: 90 U/L (ref 38–126)
Anion gap: 12 (ref 5–15)
BUN: 39 mg/dL — ABNORMAL HIGH (ref 8–23)
CO2: 25 mmol/L (ref 22–32)
Calcium: 8.2 mg/dL — ABNORMAL LOW (ref 8.9–10.3)
Chloride: 98 mmol/L (ref 98–111)
Creatinine, Ser: 6.49 mg/dL — ABNORMAL HIGH (ref 0.44–1.00)
GFR, Estimated: 6 mL/min — ABNORMAL LOW (ref >60)
Glucose, Bld: 86 mg/dL (ref 70–99)
Potassium: 4.3 mmol/L (ref 3.5–5.1)
Sodium: 135 mmol/L (ref 135–145)
Total Bilirubin: 0.7 mg/dL (ref 0.3–1.2)
Total Protein: 6.8 g/dL (ref 6.5–8.1)

## 2022-04-21 LAB — RENAL FUNCTION PANEL
Albumin: 3.3 g/dL — ABNORMAL LOW (ref 3.5–5.0)
Anion gap: 15 (ref 5–15)
BUN: 56 mg/dL — ABNORMAL HIGH (ref 8–23)
CO2: 22 mmol/L (ref 22–32)
Calcium: 8.6 mg/dL — ABNORMAL LOW (ref 8.9–10.3)
Chloride: 97 mmol/L — ABNORMAL LOW (ref 98–111)
Creatinine, Ser: 7.98 mg/dL — ABNORMAL HIGH (ref 0.44–1.00)
GFR, Estimated: 5 mL/min — ABNORMAL LOW (ref >60)
Glucose, Bld: 116 mg/dL — ABNORMAL HIGH (ref 70–99)
Phosphorus: 5.6 mg/dL — ABNORMAL HIGH (ref 2.5–4.6)
Potassium: 4.8 mmol/L (ref 3.5–5.1)
Sodium: 134 mmol/L — ABNORMAL LOW (ref 135–145)

## 2022-04-21 LAB — FOLATE: Folate: >40 ng/mL (ref >5.9)

## 2022-04-21 LAB — HEPATITIS B CORE ANTIBODY, TOTAL: Hep B Core Total Ab: NONREACTIVE

## 2022-04-21 LAB — CBC
HCT: 30.5 % — ABNORMAL LOW (ref 36.0–46.0)
Hemoglobin: 10.2 g/dL — ABNORMAL LOW (ref 12.0–15.0)
MCH: 34.1 pg — ABNORMAL HIGH (ref 26.0–34.0)
MCHC: 33.4 g/dL (ref 30.0–36.0)
MCV: 102 fL — ABNORMAL HIGH (ref 80.0–100.0)
Platelets: 177 10*3/uL (ref 150–400)
RBC: 2.99 MIL/uL — ABNORMAL LOW (ref 3.87–5.11)
RDW: 21.2 % — ABNORMAL HIGH (ref 11.5–15.5)
WBC: 7.6 10*3/uL (ref 4.0–10.5)
nRBC: 0 % (ref 0.0–0.2)

## 2022-04-21 LAB — FERRITIN: Ferritin: 912 ng/mL — ABNORMAL HIGH (ref 11–307)

## 2022-04-21 LAB — HEPATITIS B SURFACE ANTIGEN: Hepatitis B Surface Ag: NONREACTIVE

## 2022-04-21 LAB — POC OCCULT BLOOD, ED: Fecal Occult Bld: NEGATIVE

## 2022-04-21 LAB — VITAMIN B12: Vitamin B-12: >7500 pg/mL — ABNORMAL HIGH (ref 180–914)

## 2022-04-21 LAB — HEPATITIS B SURFACE ANTIBODY,QUALITATIVE: Hep B S Ab: REACTIVE — AB

## 2022-04-21 LAB — HEPATITIS C ANTIBODY: HCV Ab: NONREACTIVE

## 2022-04-21 MED ORDER — DARBEPOETIN ALFA 200 MCG/0.4ML IJ SOSY
200.0000 ug | PREFILLED_SYRINGE | INTRAMUSCULAR | Status: DC
  Filled 2022-04-21: qty 0.4

## 2022-04-21 MED ORDER — CALCITRIOL 0.5 MCG PO CAPS: 2.2500 ug | ORAL_CAPSULE | ORAL | Status: DC

## 2022-04-21 MED ORDER — AMIODARONE HCL 200 MG PO TABS
200.0000 mg | ORAL_TABLET | Freq: Every day | ORAL | Status: DC
  Administered 2022-04-21 – 2022-04-24 (×3): 200 mg via ORAL
  Filled 2022-04-21 (×4): qty 1

## 2022-04-21 MED ORDER — ONDANSETRON HCL 4 MG/2ML IJ SOLN
4.0000 mg | Freq: Four times a day (QID) | INTRAMUSCULAR | Status: DC | PRN
  Administered 2022-04-23: 4 mg via INTRAVENOUS
  Filled 2022-04-21: qty 2

## 2022-04-21 MED ORDER — LIDOCAINE-PRILOCAINE 2.5-2.5 % EX CREA: 1.0000 | TOPICAL_CREAM | CUTANEOUS | Status: DC | PRN

## 2022-04-21 MED ORDER — ANTICOAGULANT SODIUM CITRATE 4% (200MG/5ML) IV SOLN: 5.0000 mL | Status: DC | PRN

## 2022-04-21 MED ORDER — LEVOTHYROXINE SODIUM 25 MCG PO TABS
25.0000 ug | ORAL_TABLET | Freq: Every day | ORAL | Status: DC
  Administered 2022-04-22 – 2022-04-25 (×3): 25 ug via ORAL
  Filled 2022-04-21 (×5): qty 1

## 2022-04-21 MED ORDER — HEPARIN SODIUM (PORCINE) 1000 UNIT/ML DIALYSIS: 1000.0000 [IU] | INTRAMUSCULAR | Status: DC | PRN

## 2022-04-21 MED ORDER — HYDROXYZINE HCL 25 MG PO TABS
100.0000 mg | ORAL_TABLET | Freq: Every day | ORAL | Status: DC
  Administered 2022-04-21: 100 mg via ORAL
  Filled 2022-04-21: qty 4

## 2022-04-21 MED ORDER — MIDODRINE HCL 5 MG PO TABS: 5.0000 mg | ORAL_TABLET | ORAL | Status: DC

## 2022-04-21 MED ORDER — SEVELAMER CARBONATE 800 MG PO TABS
2400.0000 mg | ORAL_TABLET | Freq: Three times a day (TID) | ORAL | Status: DC
  Administered 2022-04-21 – 2022-04-26 (×11): 2400 mg via ORAL
  Filled 2022-04-21 (×12): qty 3

## 2022-04-21 MED ORDER — MORPHINE SULFATE (PF) 2 MG/ML IV SOLN: 2.0000 mg | INTRAVENOUS | Status: DC | PRN

## 2022-04-21 MED ORDER — PANTOPRAZOLE SODIUM 40 MG PO TBEC
40.0000 mg | DELAYED_RELEASE_TABLET | Freq: Every day | ORAL | Status: DC
  Administered 2022-04-21 – 2022-04-26 (×6): 40 mg via ORAL
  Filled 2022-04-21 (×6): qty 1

## 2022-04-21 MED ORDER — ALTEPLASE 2 MG IJ SOLR: 2.0000 mg | Freq: Once | INTRAMUSCULAR | Status: DC | PRN

## 2022-04-21 MED ORDER — ALPRAZOLAM 0.5 MG PO TABS: 0.5000 mg | ORAL_TABLET | Freq: Two times a day (BID) | ORAL | Status: DC | PRN

## 2022-04-21 MED ORDER — PENTAFLUOROPROP-TETRAFLUOROETH EX AERO
1.0000 | INHALATION_SPRAY | CUTANEOUS | Status: DC | PRN
  Filled 2022-04-21: qty 30

## 2022-04-21 MED ORDER — CHLORHEXIDINE GLUCONATE CLOTH 2 % EX PADS
6.0000 | MEDICATED_PAD | Freq: Every day | CUTANEOUS | Status: DC
  Administered 2022-04-22 – 2022-04-23 (×2): 6 via TOPICAL

## 2022-04-21 MED ORDER — ROSUVASTATIN CALCIUM 5 MG PO TABS
10.0000 mg | ORAL_TABLET | Freq: Every day | ORAL | Status: DC
  Administered 2022-04-21 – 2022-04-26 (×5): 10 mg via ORAL
  Filled 2022-04-21: qty 1
  Filled 2022-04-21 (×3): qty 2
  Filled 2022-04-21: qty 1

## 2022-04-21 MED ORDER — ACETAMINOPHEN 325 MG PO TABS: 650.0000 mg | ORAL_TABLET | Freq: Four times a day (QID) | ORAL | Status: DC | PRN

## 2022-04-21 MED ORDER — OXYCODONE HCL 5 MG PO TABS: 5.0000 mg | ORAL_TABLET | ORAL | Status: DC | PRN

## 2022-04-21 MED ORDER — SEVELAMER CARBONATE 800 MG PO TABS: 2400.0000 mg | ORAL_TABLET | Freq: Three times a day (TID) | ORAL | Status: DC

## 2022-04-21 MED ORDER — ONDANSETRON HCL 4 MG PO TABS: 4.0000 mg | ORAL_TABLET | Freq: Four times a day (QID) | ORAL | Status: DC | PRN

## 2022-04-21 MED ORDER — MIDODRINE HCL 5 MG PO TABS
5.0000 mg | ORAL_TABLET | ORAL | Status: DC
  Filled 2022-04-21: qty 2

## 2022-04-21 MED ORDER — CINACALCET HCL 30 MG PO TABS
30.0000 mg | ORAL_TABLET | ORAL | Status: DC
  Filled 2022-04-21: qty 1

## 2022-04-21 MED ORDER — ACETAMINOPHEN 650 MG RE SUPP: 650.0000 mg | Freq: Four times a day (QID) | RECTAL | Status: DC | PRN

## 2022-04-21 MED ORDER — METOPROLOL TARTRATE 12.5 MG HALF TABLET
12.5000 mg | ORAL_TABLET | Freq: Two times a day (BID) | ORAL | Status: DC
  Administered 2022-04-21 – 2022-04-26 (×9): 12.5 mg via ORAL
  Filled 2022-04-21 (×10): qty 1

## 2022-04-21 MED ORDER — LIDOCAINE HCL (PF) 1 % IJ SOLN: 5.0000 mL | INTRAMUSCULAR | Status: DC | PRN

## 2022-04-21 NOTE — Consult Note (Signed)
ESRD Consult Note  Assessment/Recommendations:   ESRD:  -Outpatient orders: [facility], MWF.  3.5 hours.  300/500 flow rates.  2K/2.5 calcium.  EDW 59 kg.  Left upper extremity AVG, 15-gauge.  Meds: No heparin.  Mircera 200 mcg every 2 weeks (last dose 10/2), Venofer 50 mg q. weekly (last dose on 10/9), calcitriol 2.25 mcg 3 times weekly, Sensipar 30 mg 3 times weekly. -HD tomorrow on MWF schedule  Generalized weakness -likely related to symptomatic anemia  Volume/ hypertension: EDW 59kg. Attempt to achieve EDW as tolerated  Acute on chronic anemia (symptomatic), Anemia of Chronic Kidney Disease: Hemoglobin 6.7 -FOBT neg, w/u per primary -will dose her ESA for tomorrow-earlier than anticipated (due for this next Monday) -no heparin  Secondary Hyperparathyroidism/Hyperphosphatemia: resume home binders, calcitriol and sensipar resumed   Gean Quint, MD [facility]  History of Present Illness: Shawna [name] is a/an 81 y.o. female with a past medical history of ESRD on HD, CHF, CAD, paroxysmal A-fib, chronic anemia, hypertension, GERD, hyperlipidemia who presents with generalized weakness, unable to ambulate.  Was found to have a hemoglobin of 6.6, ordered 2 units PRBC which just started at the time of my encounter (delay given antibodies).  FOBT negative in ER. Patient seen and examined in the ER. She reports that she currently feels okay. Husband at the bedside. She reports that HD has been going well with the exception of some hypotension during treatments which limits UF. She otherwise denies any fevers, chest pain, SOB, swelling, prolonged bleeding from AVG, melena or black tarry stools.   Medications:  Current Facility-Administered Medications  Medication Dose Route Frequency Provider Last Rate Last Admin   0.9 %  sodium chloride infusion  10 mL/hr Intravenous Once Godfrey Pick, MD       acetaminophen (TYLENOL) tablet 650 mg  650 mg Oral Q6H PRN Zierle-Ghosh, Asia B, DO        Or   acetaminophen (TYLENOL) suppository 650 mg  650 mg Rectal Q6H PRN Zierle-Ghosh, Asia B, DO       ALPRAZolam (XANAX) tablet 0.5 mg  0.5 mg Oral BID PRN Zierle-Ghosh, Asia B, DO       amiodarone (PACERONE) tablet 200 mg  200 mg Oral Daily Zierle-Ghosh, Asia B, DO       hydrOXYzine (ATARAX) tablet 100 mg  100 mg Oral QHS Zierle-Ghosh, Asia B, DO       levothyroxine (SYNTHROID) tablet 25 mcg  25 mcg Oral Daily Zierle-Ghosh, Asia B, DO       metoprolol tartrate (LOPRESSOR) tablet 12.5 mg  12.5 mg Oral BID Zierle-Ghosh, Asia B, DO       [START ON 04/22/2022] midodrine (PROAMATINE) tablet 5-10 mg  5-10 mg Oral 3 times per day on Mon Wed Fri Zierle-Ghosh, [place] B, DO       morphine (PF) 2 MG/ML injection 2 mg  2 mg Intravenous Q2H PRN Zierle-Ghosh, Asia B, DO       ondansetron (ZOFRAN) tablet 4 mg  4 mg Oral Q6H PRN Zierle-Ghosh, Asia B, DO       Or   ondansetron (ZOFRAN) injection 4 mg  4 mg Intravenous Q6H PRN Zierle-Ghosh, Asia B, DO       oxyCODONE (Oxy IR/ROXICODONE) immediate release tablet 5 mg  5 mg Oral Q4H PRN Zierle-Ghosh, Asia B, DO       pantoprazole (PROTONIX) EC tablet 40 mg  40 mg Oral Daily Zierle-Ghosh, Asia B, DO       rosuvastatin (CRESTOR)  tablet 10 mg  10 mg Oral Daily Zierle-Ghosh, Asia B, DO       sevelamer carbonate (RENVELA) tablet 2,400 mg  2,400 mg Oral TID WC Zierle-Ghosh, Asia B, DO   2,400 mg at 04/21/22 0803   Current Outpatient Medications  Medication Sig Dispense Refill   ALPRAZolam (XANAX) 0.5 MG tablet Take 0.5 mg by mouth daily as needed.     amiodarone (PACERONE) 100 MG tablet Take 2 tablets (200 mg total) by mouth daily. 30 tablet 2   Aspirin 81 MG CAPS Take 1 capsule by mouth daily at 12 noon.     cyclobenzaprine (FLEXERIL) 5 MG tablet Take 5 mg by mouth 3 (three) times daily as needed.     hydrOXYzine (VISTARIL) 100 MG capsule Take 100 mg by mouth at bedtime.     levothyroxine (SYNTHROID) 25 MCG tablet Take 1 tablet (25 mcg total) by mouth daily. 90  tablet 0   metoprolol tartrate (LOPRESSOR) 25 MG tablet Take 0.5 tablets (12.5 mg total) by mouth 2 (two) times daily. 30 tablet 6   midodrine (PROAMATINE) 5 MG tablet Take 5-10 mg by mouth See admin instructions. 3 times a week on dialysis days     multivitamin (RENA-VIT) TABS tablet Take 1 tablet by mouth daily.     nitroGLYCERIN (NITROSTAT) 0.4 MG SL tablet Place 0.4 mg under the tongue every 5 (five) minutes as needed for chest pain.     omeprazole (PRILOSEC) 20 MG capsule Take 1 capsule (20 mg total) by mouth daily. (Patient taking differently: Take 40 mg by mouth daily as needed (acid reflux).) 90 capsule 3   rosuvastatin (CRESTOR) 10 MG tablet Take 1 tablet (10 mg total) by mouth daily. 90 tablet 3   sevelamer carbonate (RENVELA) 800 MG tablet Take 2,400 mg by mouth 3 (three) times daily.       ALLERGIES Amlodipine, Aspirin, Nitrofurantoin, Bactrim [sulfamethoxazole-trimethoprim], Contrast media [iodinated contrast media], Iron, Tylenol [acetaminophen], Gabapentin, Iron sucrose, Ranexa [ranolazine], Sucroferric oxyhydroxide, Dexilant [dexlansoprazole], Hydralazine, Levaquin [levofloxacin in d5w], Morphine and related, Pantoprazole, Plavix [clopidogrel bisulfate], Protonix [pantoprazole sodium], and Venofer [ferric oxide]  MEDICAL HISTORY Past Medical History:  Diagnosis Date   Acute on chronic respiratory failure with hypoxia ([place]) 10/10/2016   Anxiety    Arthritis    AVM (arteriovenous malformation) of colon    CAD (coronary artery disease)    a. s/p CABG in 2013 b. DES to D1 in 10/2016. c. cath in 07/2018 showing patent grafts with occlusion of D1 at prior stent site and progression of PDA disease --> medical management recommended   Carotid artery disease ([place])    a. 52-84% LICA, 07/3242    Chronic anemia    Chronic bronchitis (HCC)    Chronic diastolic CHF (congestive heart failure) ([place])    a. 02/2012 Echo EF 60-65%, nl wall motion, Gr 1 DD, mod MR   Colon cancer ([place]) 1992    Esophageal stricture    ESRD on hemodialysis ([place])    ESRD due to HTN, started dialysis 2011 and gets HD at [facility] with Dr Hinda Lenis on MWF schedule.  Access is LUA AVF as of Sept 2014.    GERD (gastroesophageal reflux disease)    High cholesterol 12/2011   History of blood transfusion 07/2011; 12/2011; 01/2012 X 2; 04/2012   History of gout    History of lower GI bleeding    Hypertension    Iron deficiency anemia    Jugular vein occlusion, right ([place])  Mitral regurgitation    a. Moderate by echo, 02/2012   Myocardial infarction (HCC)    NSVT (nonsustained ventricular tachycardia) (HCC)    Ovarian cancer ([place]) 1992   PAF (paroxysmal atrial fibrillation) ([place])    Pneumonia ~ 2009   PUD (peptic ulcer disease)    TIA (transient ischemic attack)      SOCIAL HISTORY Social History   Socioeconomic History   Marital status: Married    Spouse name: Rosann Gorum   Number of children: 1   Years of education: Not on file   Highest education level: GED or equivalent  Occupational History   Occupation: retired- Technical sales engineer- [employer]  Tobacco Use   Smoking status: Never   Smokeless tobacco: Never  Vaping Use   Vaping Use: Never used  Substance and Sexual Activity   Alcohol use: No    Alcohol/week: 0.0 standard drinks of alcohol   Drug use: No   Sexual activity: Not Currently    Birth control/protection: Surgical  Other Topics Concern   Not on file  Social History Narrative   Lives in [place], [place] with husband.  Dialysis pt - [facility].   Social Determinants of Health   Financial Resource Strain: Low Risk  (12/14/2018)   Overall Financial Resource Strain (CARDIA)    Difficulty of Paying Living Expenses: Not very hard  Food Insecurity: No Food Insecurity (12/14/2018)   Hunger Vital Sign    Worried About Running Out of Food in the Last Year: Never true    Ran Out of Food in the Last Year: Never true  Transportation Needs: No Transportation Needs (12/14/2018)   PRAPARE -  Hydrologist (Medical): No    Lack of Transportation (Non-Medical): No  Physical Activity: Unknown (12/14/2018)   Exercise Vital Sign    Days of Exercise per Week: Patient refused    Minutes of Exercise per Session: Patient refused  Stress: No Stress Concern Present (12/14/2018)   [place]    Feeling of Stress : Only a little  Social Connections: Unknown (12/14/2018)   Social Connection and Isolation Panel [NHANES]    Frequency of Communication with Friends and Family: Patient refused    Frequency of Social Gatherings with Friends and Family: Patient refused    Attends Religious Services: Patient refused    Active Member of Clubs or Organizations: Patient refused    Attends Archivist Meetings: Patient refused    Marital Status: Patient refused  Intimate Partner Violence: Not At Risk (12/14/2018)   Humiliation, Afraid, Rape, and Kick questionnaire    Fear of Current or Ex-Partner: No    Emotionally Abused: No    Physically Abused: No    Sexually Abused: No     FAMILY HISTORY Family History  Problem Relation Age of Onset   Heart disease Mother        Heart Disease before age 40   Hyperlipidemia Mother    Hypertension Mother    Diabetes Mother    Heart attack Mother    Heart disease Father        Heart Disease before age 62   Hyperlipidemia Father    Hypertension Father    Diabetes Father    Diabetes Sister    Hypertension Sister    Diabetes Brother    Hyperlipidemia Brother    Heart attack Brother    Hypertension Sister    Heart attack Brother    Colon cancer  Child 61   Other Other        noncontributory for early CAD   Esophageal cancer Neg Hx    Liver disease Neg Hx    Kidney disease Neg Hx    Colon polyps Neg Hx      Review of Systems: 12 systems were reviewed and negative except per HPI  Physical Exam: Vitals:   04/21/22 0850 04/21/22 0852  BP:    Pulse:  (!) 55   Resp: 17   Temp: 97.7 F (36.5 C) 97.7 F (36.5 C)  SpO2: 99%    No intake/output data recorded. No intake or output data in the 24 hours ending 04/21/22 0904 General: well-appearing, no acute distress, sitting up in bed HEENT: anicteric sclera, MMM CV: normal rate, no murmurs, no edema Lungs: bilateral chest rise, normal wob Abd: soft, non-tender, non-distended Skin: no visible lesions or rashes Neuro: normal speech, no gross focal deficits  Dialysis access: LUE AVG +b/t  Test Results Reviewed Lab Results  Component Value Date   NA 135 04/21/2022   K 4.3 04/21/2022   CL 98 04/21/2022   CO2 25 04/21/2022   BUN 39 (H) 04/21/2022   CREATININE 6.49 (H) 04/21/2022   CALCIUM 8.2 (L) 04/21/2022   ALBUMIN 3.2 (L) 04/21/2022   PHOS 4.0 11/20/2021    I have reviewed relevant outside healthcare records

## 2022-04-21 NOTE — Assessment & Plan Note (Signed)
Check TSH, continue Synthroid. 

## 2022-04-21 NOTE — Plan of Care (Signed)
  Problem: Acute Rehab PT Goals(only PT should resolve) Goal: Pt Will Go Supine/Side To Sit Outcome: Progressing Flowsheets (Taken 04/21/2022 1548) Pt will go Supine/Side to Sit: with supervision Goal: Patient Will Transfer Sit To/From Stand Outcome: Progressing Flowsheets (Taken 04/21/2022 1548) Patient will transfer sit to/from stand:  with min guard assist  with minimal assist Goal: Pt Will Transfer Bed To Chair/Chair To Bed Outcome: Progressing Flowsheets (Taken 04/21/2022 1548) Pt will Transfer Bed to Chair/Chair to Bed: with min assist Goal: Pt Will Ambulate Outcome: Progressing Flowsheets (Taken 04/21/2022 1548) Pt will Ambulate:  25 feet  with minimal assist  with rolling walker   3:49 PM, 04/21/22 Lonell Grandchild, MPT Physical Therapist with [facility] [phone] office [phone] mobile phone

## 2022-04-21 NOTE — Evaluation (Signed)
Physical Therapy Evaluation Patient Details Name: Shawna [name] MRN: [id number] DOB: 05/03/1941 Today's Date: 04/21/2022  History of Present Illness  Shawna [name] is a 81 y.o. female with medical history significant of congestive heart failure, anxiety, coronary artery disease, chronic anemia, ESRD on hemodialysis, GERD, hyperlipidemia, hypertension, iron deficiency anemia, atrial fibrillation paroxysmal, and more presents ED with a chief complaint of generalized weakness.  Patient is able to ambulate with either a cane or walker at baseline.  Today she was not able to walk as she was too weak.  She has no asymmetry to her weakness.  She reports its been developing over 2-3 days but acutely worse today.  She has had no pain or numbness in her lower extremities.  Patient denies fever.  She has had a cough but reports it is chronic and its dry.  Patient has had a lot of sinus drainage.  She denies any dysuria she does not make urine.  She denies diarrhea.  She does states she is eating normally with a normal appetite.  Family at bedside does not dampen and correct her.  Patient denies any chest pain, shortness of breath, paresthesias.  No further complaints at this time   Clinical Impression  Patient demonstrates slow labored movement for sitting up at bedside, very unsteady on feet with frequent loss of balance, required use of RW for safety and limited to a few side steps at bedside before having to sit due to fatigue and generalized weakness.  Patient tolerated sitting on BSC to attempt bowel movement after therapy with spouse present in room - nursing staff notified.  Patient will benefit from continued skilled physical therapy in hospital and recommended venue below to increase strength, balance, endurance for safe ADLs and gait.          Recommendations for follow up therapy are one component of a multi-disciplinary discharge planning process, led by the attending physician.  Recommendations may be  updated based on patient status, additional functional criteria and insurance authorization.  Follow Up Recommendations Skilled nursing-short term rehab (<3 hours/day) Can patient physically be transported by private vehicle: Yes    Assistance Recommended at Discharge Intermittent Supervision/Assistance  Patient can return home with the following  A lot of help with walking and/or transfers;A lot of help with bathing/dressing/bathroom;Assistance with cooking/housework;Help with stairs or ramp for entrance    Equipment Recommendations None recommended by PT  Recommendations for Other Services       Functional Status Assessment Patient has had a recent decline in their functional status and demonstrates the ability to make significant improvements in function in a reasonable and predictable amount of time.     Precautions / Restrictions Precautions Precautions: Fall Restrictions Weight Bearing Restrictions: No      Mobility  Bed Mobility Overal bed mobility: Needs Assistance Bed Mobility: Supine to Sit, Sit to Supine     Supine to sit: Min assist Sit to supine: Min guard   General bed mobility comments: increased time, labored movement    Transfers Overall transfer level: Needs assistance Equipment used: Rolling walker (2 wheels) Transfers: Sit to/from Stand, Bed to chair/wheelchair/BSC Sit to Stand: Min assist, Mod assist   Step pivot transfers: Min assist, Mod assist       General transfer comment: slow labored movement, had to use RW due to poor standing balance    Ambulation/Gait Ambulation/Gait assistance: Mod assist Gait Distance (Feet): 3 Feet Assistive device: Rolling walker (2 wheels) Gait Pattern/deviations: Decreased step length -  right, Decreased step length - left, Decreased stride length, Trunk flexed Gait velocity: slow     General Gait Details: limited to a few slow labored side steps due to generalized weakness and poor standing  balance  Stairs            Wheelchair Mobility    Modified Rankin (Stroke Patients Only)       Balance Overall balance assessment: Needs assistance Sitting-balance support: Feet supported, No upper extremity supported Sitting balance-Leahy Scale: Fair Sitting balance - Comments: seated at EOB   Standing balance support: During functional activity, Bilateral upper extremity supported Standing balance-Leahy Scale: Poor Standing balance comment: fair/poor using RW                             Pertinent Vitals/Pain Pain Assessment Pain Assessment: No/denies pain    Home Living Family/patient expects to be discharged to:: Private residence Living Arrangements: Spouse/significant other Available Help at Discharge: Family;Available 24 hours/day Type of Home: House Home Access: Stairs to enter Entrance Stairs-Rails: Right Entrance Stairs-Number of Steps: 2   Home Layout: One level Home Equipment: Rollator (4 wheels);Cane - single point      Prior Function Prior Level of Function : Needs assist;Independent/Modified Independent;Patient poor historian/Family not available       Physical Assist : Mobility (physical) Mobility (physical): Bed mobility;Transfers;Gait;Stairs   Mobility Comments: Patient states uses rollator or SPC PRN; spouse assists with stairs ADLs Comments: assisted by family     Hand Dominance   Dominant Hand: Right    Extremity/Trunk Assessment   Upper Extremity Assessment Upper Extremity Assessment: Defer to OT evaluation    Lower Extremity Assessment Lower Extremity Assessment: Generalized weakness    Cervical / Trunk Assessment Cervical / Trunk Assessment: Kyphotic  Communication   Communication: No difficulties  Cognition Arousal/Alertness: Awake/alert Behavior During Therapy: WFL for tasks assessed/performed Overall Cognitive Status: Within Functional Limits for tasks assessed                                           General Comments      Exercises     Assessment/Plan    PT Assessment Patient needs continued PT services  PT Problem List Decreased strength;Decreased activity tolerance;Decreased balance;Decreased mobility       PT Treatment Interventions DME instruction;Gait training;Therapeutic activities;Stair training;Functional mobility training;Therapeutic exercise;Patient/family education;Balance training    PT Goals (Current goals can be found in the Care Plan section)  Acute Rehab PT Goals Patient Stated Goal: return home with family to assist PT Goal Formulation: With patient/family Time For Goal Achievement: 05/05/22 Potential to Achieve Goals: Good    Frequency Min 3X/week     Co-evaluation               AM-PAC PT "6 Clicks" Mobility  Outcome Measure Help needed turning from your back to your side while in a flat bed without using bedrails?: A Little Help needed moving from lying on your back to sitting on the side of a flat bed without using bedrails?: A Little Help needed moving to and from a bed to a chair (including a wheelchair)?: A Lot Help needed standing up from a chair using your arms (e.g., wheelchair or bedside chair)?: A Lot Help needed to walk in hospital room?: A Lot Help needed climbing 3-5 steps with a railing? :  A Lot 6 Click Score: 14    End of Session   Activity Tolerance: Patient tolerated treatment well;Patient limited by fatigue Patient left: with call bell/phone within reach;Other (comment) (patient left on [facility] with spouse in room) Nurse Communication: Mobility status PT Visit Diagnosis: Unsteadiness on feet (R26.81);Other abnormalities of gait and mobility (R26.89);Muscle weakness (generalized) (M62.81)    Time: 0600-4599 PT Time Calculation (min) (ACUTE ONLY): 27 min   Charges:   PT Evaluation $PT Eval Moderate Complexity: 1 Mod PT Treatments $Therapeutic Activity: 23-37 mins        3:47 PM, 04/21/22 Lonell Grandchild, MPT Physical Therapist with [facility] [phone] office [phone] mobile phone

## 2022-04-21 NOTE — ED Notes (Signed)
Nephrologist at bedside speaking with patient at this time.

## 2022-04-21 NOTE — ED Notes (Signed)
Patient states that she does not produce urine.

## 2022-04-21 NOTE — Assessment & Plan Note (Signed)
Continue Protonix °

## 2022-04-21 NOTE — Progress Notes (Signed)
OT Cancellation Note  Patient Details Name: Shawna [name] MRN: [id number] DOB: May 14, 1941   Cancelled Treatment:    Reason Eval/Treat Not Completed: Patient at procedure or test/ unavailable. Pt having a blood transfusion at time of attempted evaluation. Will attempt to see pt later as time permits.   Kani Jobson OT, MOT   Larey Seat 04/21/2022, 9:19 AM

## 2022-04-21 NOTE — TOC Progression Note (Signed)
  Transition of Care [facility]) Screening Note   Patient Details  Name: [name] Date of Birth: 1941-04-22   Transition of [facility]) CM/SW Contact:    Shade Flood, LCSW Phone Number: 04/21/2022, 9:16 AM    Transition of Care Department [facility]) has reviewed patient and no TOC needs have been identified at this time. We will continue to monitor patient advancement through interdisciplinary progression rounds. If new patient transition needs arise, please place a TOC consult.

## 2022-04-21 NOTE — Assessment & Plan Note (Signed)
-   Continue beta-blocker - Continue aspirin - Continue Crestor - Continue to monitor

## 2022-04-21 NOTE — H&P (Signed)
History and Physical    Patient: Shawna [name]:[id number] DOB: October 12, 1940 DOA: 04/20/2022 DOS: the patient was seen and examined on 04/21/2022 PCP: [facility], [facility]  Patient coming from: Home  Chief Complaint:  Chief Complaint  Patient presents with   Weakness   HPI: Shawna [name] is a 81 y.o. female with medical history significant of congestive heart failure, anxiety, coronary artery disease, chronic anemia, ESRD on hemodialysis, GERD, hyperlipidemia, hypertension, iron deficiency anemia, atrial fibrillation paroxysmal, and more presents ED with a chief complaint of generalized weakness.  Patient is able to ambulate with either a cane or walker at baseline.  Today she was not able to walk as she was too weak.  She has no asymmetry to her weakness.  She reports its been developing over 2-3 days but acutely worse today.  She has had no pain or numbness in her lower extremities.  Patient denies fever.  She has had a cough but reports it is chronic and its dry.  Patient has had a lot of sinus drainage.  She denies any dysuria she does not make urine.  She denies diarrhea.  She does states she is eating normally with a normal appetite.  Family at bedside does not dampen and correct her.  Patient denies any chest pain, shortness of breath, paresthesias.  No further complaints at this time  Patient does not smoke, does not drink, does not use illicit drugs.  She is vaccinated for COVID.  Patient is full code. Review of Systems: As mentioned in the history of present illness. All other systems reviewed and are negative. Past Medical History:  Diagnosis Date   Acute on chronic respiratory failure with hypoxia ([place]) 10/10/2016   Anxiety    Arthritis    AVM (arteriovenous malformation) of colon    CAD (coronary artery disease)    a. s/p CABG in 2013 b. DES to D1 in 10/2016. c. cath in 07/2018 showing patent grafts with occlusion of D1 at prior stent site and progression of PDA disease -->  medical management recommended   Carotid artery disease ([place])    a. 75-91% LICA, 12/3844    Chronic anemia    Chronic bronchitis (HCC)    Chronic diastolic CHF (congestive heart failure) ([place])    a. 02/2012 Echo EF 60-65%, nl wall motion, Gr 1 DD, mod MR   Colon cancer ([place]) 1992   Esophageal stricture    ESRD on hemodialysis ([place])    ESRD due to HTN, started dialysis 2011 and gets HD at [facility] with Dr Hinda Lenis on MWF schedule.  Access is LUA AVF as of Sept 2014.    GERD (gastroesophageal reflux disease)    High cholesterol 12/2011   History of blood transfusion 07/2011; 12/2011; 01/2012 X 2; 04/2012   History of gout    History of lower GI bleeding    Hypertension    Iron deficiency anemia    Jugular vein occlusion, right (HCC)    Mitral regurgitation    a. Moderate by echo, 02/2012   Myocardial infarction [facility])    NSVT (nonsustained ventricular tachycardia) ([place])    Ovarian cancer ([place]) 1992   PAF (paroxysmal atrial fibrillation) ([place])    Pneumonia ~ 2009   PUD (peptic ulcer disease)    TIA (transient ischemic attack)    Past Surgical History:  Procedure Laterality Date   A/V SHUNTOGRAM Left 03/19/2019   Procedure: A/V SHUNTOGRAM;  Surgeon: Katha Cabal, MD;  Location: [place] [facility];  Service: Cardiovascular;  Laterality: Left;   ABDOMINAL HYSTERECTOMY  1992   APPENDECTOMY  06/1990   AV FISTULA PLACEMENT  07/2009   left upper arm   AV FISTULA PLACEMENT Right 09/06/2016   Procedure: RIGHT FOREARM ARTERIOVENOUS (AV) GRAFT;  Surgeon: Elam Dutch, MD;  Location: [place];  Service: Vascular;  Laterality: Right;   AV FISTULA PLACEMENT N/A 02/24/2017   Procedure: INSERTION OF ARTERIOVENOUS (AV) GORE-TEX GRAFT ARM (BRACHIAL AXILLARY);  Surgeon: Katha Cabal, MD;  Location: [facility];  Service: Vascular;  Laterality: N/A;   [place] Right 09/06/2016   Procedure: REMOVAL OF Right Arm ARTERIOVENOUS GORETEX GRAFT and Vein Patch angioplasty of brachial artery;   Surgeon: Angelia Mould, MD;  Location: [place];  Service: Vascular;  Laterality: Right;   BIOPSY  09/26/2019   Procedure: BIOPSY;  Surgeon: Rogene Houston, MD;  [facility];  Service: Endoscopy;;   COLON RESECTION  1992   COLON SURGERY     COLONOSCOPY N/A 03/09/2019   Procedure: COLONOSCOPY;  Surgeon: Rogene Houston, MD;  [facility];  Service: Endoscopy;  Laterality: N/A;   COLONOSCOPY WITH PROPOFOL N/A 06/08/2021   Procedure: COLONOSCOPY WITH PROPOFOL;  Surgeon: Harvel Quale, MD;  [facility];  Service: Gastroenterology;  Laterality: N/A;  9:05 /Patient is on dialysis Mon Wed Fri   CORONARY ANGIOPLASTY WITH STENT PLACEMENT  12/15/11   "2"   CORONARY ANGIOPLASTY WITH STENT PLACEMENT  y/2013   "1; makes total of 3" (05/02/2012)   CORONARY ARTERY BYPASS GRAFT  06/13/2012   Procedure: CORONARY ARTERY BYPASS GRAFTING (CABG);  Surgeon: Grace Isaac, MD;  Location: [place];  Service: Open Heart Surgery;  Laterality: N/A;  cabg x four;  using left internal mammary artery, and left leg greater saphenous vein harvested endoscopically   CORONARY STENT INTERVENTION N/A 10/13/2016   Procedure: Coronary Stent Intervention;  Surgeon: Troy Sine, MD;  Location: [place] [facility];  Service: Cardiovascular;  Laterality: N/A;   DIALYSIS/PERMA CATHETER REMOVAL N/A 04/18/2017   Procedure: DIALYSIS/PERMA CATHETER REMOVAL;  Surgeon: Katha Cabal, MD;  Location: [place] [facility];  Service: Cardiovascular;  Laterality: N/A;   DILATION AND CURETTAGE OF UTERUS     ENTEROSCOPY N/A 06/08/2021   Procedure: PUSH ENTEROSCOPY;  Surgeon: Harvel Quale, MD;  [facility];  Service: Gastroenterology;  Laterality: N/A;   ESOPHAGOGASTRODUODENOSCOPY  01/20/2012   Procedure: ESOPHAGOGASTRODUODENOSCOPY (EGD);  Surgeon: Ladene Artist, MD,FACG;  Location[facility];  Service: Endoscopy;  Laterality: N/A;   ESOPHAGOGASTRODUODENOSCOPY N/A  03/26/2013   Procedure: ESOPHAGOGASTRODUODENOSCOPY (EGD);  Surgeon: Irene Shipper, MD;  Location: [facility] ENDOSCOPY;  Service: Endoscopy;  Laterality: N/A;   ESOPHAGOGASTRODUODENOSCOPY N/A 04/30/2015   Procedure: ESOPHAGOGASTRODUODENOSCOPY (EGD);  Surgeon: Rogene Houston, MD;  [facility];  Service: Endoscopy;  Laterality: N/A;  1pm - moved to 10/20 @ 1:10   ESOPHAGOGASTRODUODENOSCOPY N/A 07/29/2016   Procedure: ESOPHAGOGASTRODUODENOSCOPY (EGD);  Surgeon: Manus Gunning, MD;  Location: [place];  Service: Gastroenterology;  Laterality: N/A;  enteroscopy   ESOPHAGOGASTRODUODENOSCOPY N/A 09/26/2019   Procedure: ESOPHAGOGASTRODUODENOSCOPY (EGD);  Surgeon: Rogene Houston, MD;  [facility];  Service: Endoscopy;  Laterality: N/A;  1250   ESOPHAGOGASTRODUODENOSCOPY (EGD) WITH PROPOFOL N/A 02/05/2021   Procedure: ESOPHAGOGASTRODUODENOSCOPY (EGD) WITH PROPOFOL;  Surgeon: Eloise Harman, DO;  [facility];  Service: Endoscopy;  Laterality: N/A;   GIVENS CAPSULE STUDY N/A 03/07/2019   Procedure:  GIVENS CAPSULE STUDY;  Surgeon: Rogene Houston, MD;  [facility];  Service: Endoscopy;  Laterality: N/A;  7:30   GIVENS CAPSULE STUDY N/A 04/22/2021   Procedure: GIVENS CAPSULE STUDY;  Surgeon: Rogene Houston, MD;  [facility];  Service: Endoscopy;  Laterality: N/A;  7:30   INSERTION OF DIALYSIS CATHETER N/A 10/05/2020   Procedure: ABORTED TUNNELED DIALYSIS CATHETER PLACEMENT RIGHT INTERNAL JUGULAR VEIN ;  Surgeon: Virl Cagey, MD;  [facility];  Service: General;  Laterality: N/A;   INTRAOPERATIVE TRANSESOPHAGEAL ECHOCARDIOGRAM  06/13/2012   Procedure: INTRAOPERATIVE TRANSESOPHAGEAL ECHOCARDIOGRAM;  Surgeon: Grace Isaac, MD;  Location: [place];  Service: Open Heart Surgery;  Laterality: N/A;   IR DIALY SHUNT INTRO NEEDLE/INTRACATH INITIAL W/IMG LEFT Left 10/06/2020   IR FLUORO GUIDE CV LINE RIGHT  06/17/2020   IR GENERIC HISTORICAL   07/26/2016   IR FLUORO GUIDE CV LINE RIGHT 07/26/2016 Greggory Keen, MD [facility]   IR GENERIC HISTORICAL  07/26/2016   IR US GUIDE VASC ACCESS RIGHT 07/26/2016 Greggory Keen, MD [facility]   IR GENERIC HISTORICAL  08/02/2016   IR US GUIDE VASC ACCESS RIGHT 08/02/2016 Greggory Keen, MD [facility]   IR GENERIC HISTORICAL  08/02/2016   IR FLUORO GUIDE CV LINE RIGHT 08/02/2016 Greggory Keen, MD [facility]   IR RADIOLOGY PERIPHERAL GUIDED IV START  03/28/2017   IR REMOVAL TUN CV CATH W/O FL  08/11/2020   IR THROMBECTOMY AV FISTULA W/THROMBOLYSIS INC/SHUNT/IMG LEFT Left 06/17/2020   IR US GUIDE VASC ACCESS LEFT  06/17/2020   IR US GUIDE VASC ACCESS RIGHT  03/28/2017   IR US GUIDE VASC ACCESS RIGHT  06/17/2020   LEFT HEART CATH AND CORONARY ANGIOGRAPHY N/A 09/20/2016   Procedure: Left Heart Cath and Coronary Angiography;  Surgeon: [name], MD;  Location: [place] [facility];  Service: Cardiovascular;  Laterality: N/A;   LEFT HEART CATH AND CORS/GRAFTS ANGIOGRAPHY N/A 10/13/2016   Procedure: Left Heart Cath and Cors/Grafts Angiography;  Surgeon: Troy Sine, MD;  Location: [place] [facility];  Service: Cardiovascular;  Laterality: N/A;   LEFT HEART CATH AND CORS/GRAFTS ANGIOGRAPHY N/A 07/13/2018   Procedure: LEFT HEART CATH AND CORS/GRAFTS ANGIOGRAPHY;  Surgeon: Martinique, Peter M, MD;  Location: [place] [facility];  Service: Cardiovascular;  Laterality: N/A;   LEFT HEART CATH AND CORS/GRAFTS ANGIOGRAPHY N/A 07/22/2021   Procedure: LEFT HEART CATH AND CORS/GRAFTS ANGIOGRAPHY;  Surgeon: Lorretta Harp, MD;  Location: [place] [facility];  Service: Cardiovascular;  Laterality: N/A;   LEFT HEART CATHETERIZATION WITH CORONARY ANGIOGRAM N/A 12/15/2011   Procedure: LEFT HEART CATHETERIZATION WITH CORONARY ANGIOGRAM;  Surgeon: Burnell Blanks, MD;  Location: [facility] CATH LAB;  Service: Cardiovascular;  Laterality: N/A;   LEFT HEART CATHETERIZATION WITH CORONARY ANGIOGRAM N/A 01/10/2012   Procedure: LEFT  HEART CATHETERIZATION WITH CORONARY ANGIOGRAM;  Surgeon: Peter M Martinique, MD;  Location: [facility] CATH LAB;  Service: Cardiovascular;  Laterality: N/A;   LEFT HEART CATHETERIZATION WITH CORONARY ANGIOGRAM N/A 06/08/2012   Procedure: LEFT HEART CATHETERIZATION WITH CORONARY ANGIOGRAM;  Surgeon: Burnell Blanks, MD;  Location: [facility] CATH LAB;  Service: Cardiovascular;  Laterality: N/A;   LEFT HEART CATHETERIZATION WITH CORONARY/GRAFT ANGIOGRAM N/A 12/10/2013   Procedure: LEFT HEART CATHETERIZATION WITH Beatrix Fetters;  Surgeon: [name], MD;  Location: [facility] CATH LAB;  Service: Cardiovascular;  Laterality: N/A;   OVARY SURGERY     ovarian cancer   POLYPECTOMY  03/09/2019  Procedure: POLYPECTOMY;  Surgeon: Rogene Houston, MD;  [facility];  Service: Endoscopy;;  cecal    POLYPECTOMY N/A 09/26/2019   Procedure: DUODENAL POLYPECTOMY;  Surgeon: Rogene Houston, MD;  [facility];  Service: Endoscopy;  Laterality: N/A;   REVISION OF ARTERIOVENOUS GORETEX GRAFT N/A 02/24/2017   Procedure: REVISION OF ARTERIOVENOUS GORETEX GRAFT (RESECTION);  Surgeon: Katha Cabal, MD;  Location: [facility];  Service: Vascular;  Laterality: N/A;   REVISON OF ARTERIOVENOUS FISTULA Left 06/19/2020   Procedure: REVISION OF LEFT UPPER ARM AV GRAFT WITH INTERPOSITION JUMP GRAFT USING 6MM GORE LIMB;  Surgeon: Marty Heck, MD;  Location: [place];  Service: Vascular;  Laterality: Left;   SHUNTOGRAM N/A 10/15/2013   Procedure: Fistulogram;  Surgeon: Serafina Mitchell, MD;  Location: [facility] CATH LAB;  Service: Cardiovascular;  Laterality: N/A;   THROMBECTOMY / ARTERIOVENOUS GRAFT REVISION  2011   left upper arm   TUBAL LIGATION  1980's   UPPER EXTREMITY ANGIOGRAPHY Bilateral 12/06/2016   Procedure: Upper Extremity Angiography;  Surgeon: Katha Cabal, MD;  Location: [place] [facility];  Service: Cardiovascular;  Laterality: Bilateral;   UPPER EXTREMITY INTERVENTION Left 06/06/2017    Procedure: UPPER EXTREMITY INTERVENTION;  Surgeon: Katha Cabal, MD;  Location: [place] [facility];  Service: Cardiovascular;  Laterality: Left;   Social History:  reports that she has never smoked. She has never used smokeless tobacco. She reports that she does not drink alcohol and does not use drugs.  Allergies  Allergen Reactions   Amlodipine Swelling   Aspirin Other (See Comments)    High Doses Mess up her stomach; "makes my bowels have blood in them". Takes 81 mg EC Aspirin    Nitrofurantoin Hives   Bactrim [Sulfamethoxazole-Trimethoprim] Rash   Contrast Media [Iodinated Contrast Media] Itching   Iron Itching and Other (See Comments)    "they gave me iron in dialysis; had to give me Benadryl cause I had to have the iron" (05/02/2012)   Tylenol [Acetaminophen] Itching and Other (See Comments)    Makes her feet on fire per pt   Gabapentin Other (See Comments)    Unknown reaction   Iron Sucrose Other (See Comments)    Unknown   Ranexa [Ranolazine] Other (See Comments)    Myoclonus-hospitalized    Sucroferric Oxyhydroxide Other (See Comments)    Unknown   Dexilant [Dexlansoprazole] Other (See Comments)    Upset stomach   Hydralazine Itching    Has tolerated while inpatient Has tolerated while inpatient   Levaquin [Levofloxacin In D5w] Rash   Morphine And Related Itching and Other (See Comments)    Itching in feet   Pantoprazole Rash   Plavix [Clopidogrel Bisulfate] Rash   Protonix [Pantoprazole Sodium] Rash   Venofer [Ferric Oxide] Itching and Other (See Comments)    Patient reports using Benadryl prior to doses as [place]    Family History  Problem Relation Age of Onset   Heart disease Mother        Heart Disease before age 23   Hyperlipidemia Mother    Hypertension Mother    Diabetes Mother    Heart attack Mother    Heart disease Father        Heart Disease before age 62   Hyperlipidemia Father    Hypertension Father    Diabetes Father     Diabetes Sister    Hypertension Sister    Diabetes Brother    Hyperlipidemia Brother  Heart attack Brother    Hypertension Sister    Heart attack Brother    Colon cancer Child 81   Other Other        noncontributory for early CAD   Esophageal cancer Neg Hx    Liver disease Neg Hx    Kidney disease Neg Hx    Colon polyps Neg Hx     Prior to Admission medications   Medication Sig Start Date End Date Taking? Authorizing Provider  ALPRAZolam Duanne Moron) 0.5 MG tablet Take 0.5 mg by mouth daily as needed. 04/12/22  Yes [provider]  amiodarone (PACERONE) 100 MG tablet Take 2 tablets (200 mg total) by mouth daily. 11/23/21  Yes Ghimire, Henreitta Leber, MD  Aspirin 81 MG CAPS Take 1 capsule by mouth daily at 12 noon.   Yes [provider]  cyclobenzaprine (FLEXERIL) 5 MG tablet Take 5 mg by mouth 3 (three) times daily as needed. 12/27/21  Yes [provider]  hydrOXYzine (VISTARIL) 100 MG capsule Take 100 mg by mouth at bedtime. 12/13/21  Yes [provider]  levothyroxine (SYNTHROID) 25 MCG tablet Take 1 tablet (25 mcg total) by mouth daily. 02/02/21  Yes Brita Romp, NP  metoprolol tartrate (LOPRESSOR) 25 MG tablet Take 0.5 tablets (12.5 mg total) by mouth 2 (two) times daily. 03/04/22  Yes Branch, Alphonse Guild, MD  midodrine (PROAMATINE) 5 MG tablet Take 5-10 mg by mouth See admin instructions. 3 times a week on dialysis days 12/10/21  Yes [provider]  multivitamin (RENA-VIT) TABS tablet Take 1 tablet by mouth daily.   Yes [provider]  nitroGLYCERIN (NITROSTAT) 0.4 MG SL tablet Place 0.4 mg under the tongue every 5 (five) minutes as needed for chest pain.   Yes [provider]  omeprazole (PRILOSEC) 20 MG capsule Take 1 capsule (20 mg total) by mouth daily. Patient taking differently: Take 40 mg by mouth daily as needed (acid reflux). 12/01/20  Yes Rehman, Mechele Dawley, MD  rosuvastatin (CRESTOR) 10 MG tablet Take 1 tablet (10 mg  total) by mouth daily. 05/13/21  Yes Strader, [place], PA-C  sevelamer carbonate (RENVELA) 800 MG tablet Take 2,400 mg by mouth 3 (three) times daily. 12/27/21  Yes [provider]    Physical Exam: Vitals:   04/21/22 0100 04/21/22 0230 04/21/22 0330 04/21/22 0400  BP: (!) 157/61 (!) 146/51 (!) 168/48 (!) 160/50  Pulse: (!) 47 (!) 47 (!) 50 (!) 50  Resp: '17 17 17 19  '$ Temp:      TempSrc:      SpO2: 100% 100% 100% 100%  Weight:      Height:       1.  General: Patient lying supine in bed,  no acute distress   2. Psychiatric: Alert and oriented x 3, mood and behavior normal for situation, pleasant and cooperative with exam   3. Neurologic: Speech and language are normal, face is symmetric, moves all 4 extremities voluntarily, at baseline without acute deficits on limited exam   4. HEENMT:  Head is atraumatic, normocephalic, pupils reactive to light, neck is supple, trachea is midline, mucous membranes are moist   5. Respiratory : Lungs are clear to auscultation bilaterally without wheezing, rhonchi, rales, no cyanosis, no increase in work of breathing or accessory muscle use   6. Cardiovascular : Heart rate normal, rhythm is bradycardic, no murmurs, rubs or gallops, no peripheral edema, peripheral pulses palpated   7. Gastrointestinal:  Abdomen is soft, nondistended, nontender to  palpation bowel sounds active, no masses or organomegaly palpated   8. Skin:  Skin is warm, dry and intact without rashes, acute lesions, or ulcers on limited exam   9.Musculoskeletal:  No acute deformities or trauma, no asymmetry in tone, no peripheral edema, peripheral pulses palpated, no tenderness to palpation in the extremities  Data Reviewed: In the ED Temp 98.1, heart rate 45-51, respiratory rate 14-20, blood pressure 142/39-163/97, satting at 95-100% No leukocytosis with a white blood cell count of 8.2, hemoglobin 6.6, platelets 135 Chemistry shows a BUN of 33, creatinine 5.75 in  this dialysis patient Troponins flat 26, 27 Chest x-ray shows cardiomegaly with no pulmonary edema EKG shows a heart rate of 51, sinus bradycardic, QTc 556 2 units transfusion ordered in the ED Admission requested for symptomatic anemia  Assessment and Plan: Symptomatic anemia - Patient complains of generalized weakness, fatigue, dizziness - Hemoglobin is 6.6 which is exactly what it was when she was admitted for the same on September 09, 2021 - Baseline hemoglobin 8-9 - FOBT negative - Most likely due to anemia of chronic disease - Check anemia panel - Transfused 2 units of ordered by the ED but with antibodies blood will not be here until a.m.  Hypothyroidism (acquired) - Check TSH, continue Synthroid  Generalized weakness - Most likely secondary to anemia - Patient does have a history of hypothyroidism, check TSH - PT eval and treat - Continue to monitor  PAF (paroxysmal atrial fibrillation) (HCC) - Continue amiodarone and metoprolol - Monitor on telemetry  ESRD (end stage renal disease) on dialysis [facility]) - Hemodialysis Monday Wednesday Friday - Last dialyzed on 04/20/2022 - Continue Renvela - Consult nephro for routine dialysis - Likely with blood transfusion with dialysis as well - Continue to monitor   Chronic systolic heart failure (HCC) - Continue beta-blocker - Continue aspirin - Continue Crestor - Continue to monitor  GERD (gastroesophageal reflux disease) - Continue Protonix  HLD (hyperlipidemia) - Continue Crestor      Advance Care Planning:   Code Status: Prior full  Consults: Nephrology  Family Communication: Husband at bedside  Severity of Illness: The appropriate patient status for this patient is OBSERVATION. Observation status is judged to be reasonable and necessary in order to provide the required intensity of service to ensure the patient's safety. The patient's presenting symptoms, physical exam findings, and initial radiographic and  laboratory data in the context of their medical condition is felt to place them at decreased risk for further clinical deterioration. Furthermore, it is anticipated that the patient will be medically stable for discharge from the hospital within 2 midnights of admission.   Author: Rolla Plate, DO 04/21/2022 4:11 AM  For on call review [email or web address].

## 2022-04-21 NOTE — Progress Notes (Signed)
81 year old F with PMH of ESRD on HD MWF, systolic CHF, CAD s/p CABG and stents, anemia of renal disease, paroxysmal A-fib not on anticoagulation, hypertension, anxiety and depression presenting with progressive generalized weakness for 2 to 3 days and admitted for symptomatic anemia.  Hgb 6.6 (baseline 8-9).  No report of melena or hematochezia.  Transfused 2 units.  Posttransfusion H&H pending.  Therapy recommended SNF.  Patient and husband in agreement.  TOC following.  Nephrology on board for ESRD.

## 2022-04-21 NOTE — ED Notes (Signed)
Pt sleeping. Husband remains at bedside.

## 2022-04-21 NOTE — Assessment & Plan Note (Signed)
-   Continue amiodarone and metoprolol - Monitor on telemetry

## 2022-04-21 NOTE — Assessment & Plan Note (Signed)
-   Patient complains of generalized weakness, fatigue, dizziness - Hemoglobin is 6.6 which is exactly what it was when she was admitted for the same on September 09, 2021 - Baseline hemoglobin 8-9 - FOBT negative - Most likely due to anemia of chronic disease - Check anemia panel - Transfused 2 units of ordered by the ED but with antibodies blood will not be here until a.m.

## 2022-04-21 NOTE — Assessment & Plan Note (Signed)
-   Most likely secondary to anemia - Patient does have a history of hypothyroidism, check TSH - PT eval and treat - Continue to monitor

## 2022-04-21 NOTE — ED Notes (Signed)
Breakfast tray given to pt 

## 2022-04-21 NOTE — [place] FL2 (Signed)
[place] MEDICAID FL2 LEVEL OF CARE SCREENING TOOL     IDENTIFICATION  Patient Name: Shawna [name] Birthdate: 1941/05/23 Sex: female Admission Date (Current Location): 04/20/2022  [facility] and [place] Number:  [employer] and Address:  [place] [address] [place]      Provider Number: [id number]  Attending Physician Name and Address:  Mercy Riding, MD  Relative Name and Phone Number:  Hackworth,Nathaniel (Spouse)   [phone]    Current Level of Care: Hospital (obs) Recommended Level of Care: [place] Prior Approval Number:    Date Approved/Denied:   PASRR Number: [id number]  Discharge Plan: SNF    Current Diagnoses: Patient Active Problem List   Diagnosis Date Noted   Otitis externa of left ear    Acute metabolic encephalopathy 54/65/0354   Acute encephalopathy 11/08/2021   NSTEMI, initial episode of care ([place]) 10/19/2021   Symptomatic anemia 09/09/2021   Ischemic cardiomyopathy    Pulmonary edema 07/16/2021   Paroxysmal Atrial fibrillation/Flutter 05/07/2021   Vitamin B deficiency 04/23/2021   Neuropathy 04/23/2021   Anemia due to chronic blood loss 04/13/2021   Left knee pain 03/15/2021   Moderate protein-calorie malnutrition ([place]) 02/27/2021   Hypokalemia 02/27/2021   COVID-19 virus infection 02/03/2021   Leukocytosis 02/03/2021   Elevated MCV 02/03/2021   Hypoalbuminemia due to protein-calorie malnutrition ([place]) 02/03/2021   Hypothyroidism (acquired) 02/03/2021   Myositis 12/03/2020   Ataxia 12/02/2020   Diabetes mellitus type 2 in nonobese ([place]) 11/10/2020   Acute pulmonary edema ([place]) 11/10/2020   Generalized weakness 11/09/2020   Dialysis AV fistula malfunction, initial encounter ([place])    Jugular vein occlusion, right (HCC)    Failure of surgically constructed arteriovenous fistula ([place]) 10/03/2020   Myoclonus 08/31/2020   Clotted renal dialysis AV graft, initial encounter ([place])     Hypertensive heart and chronic kidney disease with heart failure and stage 1 through stage 4 chronic kidney disease, or chronic kidney disease ([place])    Hemodialysis-associated hypotension    Acute hypoxemic respiratory failure ([place]) 06/14/2020   Hypertension 06/12/2020   Irritable bowel syndrome 02/25/2020   Adenomatous duodenal polyp 09/10/2019   History of GI bleed 09/10/2019   Angina pectoris ([place]) 06/05/2019   Chest pain 06/03/2019   Small intestinal bacterial overgrowth 05/14/2019   Iron deficiency anemia 04/02/2019   Acute respiratory failure with hypoxia ([place]) 12/25/2018   Elevated troponin 12/14/2018   Chest pain at rest 07/13/2018   Hand steal syndrome ([place]) 08/01/2017   Anemia 07/14/2017   Coronary artery disease 65/68/1275   Complication of vascular access for dialysis 03/19/2017   Preoperative clearance 01/25/2017   H/O non-ST elevation myocardial infarction (NSTEMI) 10/24/2016   Fluid overload 10/10/2016   Non-ST elevation (NSTEMI) myocardial infarction [facility])    Acute on chronic respiratory failure with hypoxia ([place])    Cardiac arrest [facility])    Palliative care encounter    Goals of care, counseling/discussion    Hypertensive crisis without congestive heart failure 05/09/2016   Flash pulmonary edema ([place]) 04/06/2016   Acute respiratory failure ([place]) 04/06/2016   History of colon cancer 01/27/2016   History of ovarian cancer 01/27/2016   Hypertensive urgency 01/27/2016   PAF (paroxysmal atrial fibrillation) ([place]) 10/14/2015   Malignant neoplasm of right ovary ([place]) 10/14/2015   SVT (supraventricular tachycardia) 09/08/2015   Influenza A 08/30/2015   Acute on chronic diastolic CHF (congestive heart failure) ([place]) 05/04/2015   Essential hypertension    Dyspnea  Chronic systolic heart failure (HCC) 03/22/2013   Occlusion and stenosis of carotid artery without mention of cerebral infarction 01/24/2013   Hx of CABG 07/05/2012   Carotid artery disease ([place]) 07/05/2012    Mitral regurgitation 06/12/2012   Non-STEMI (non-ST elevated myocardial infarction) ([place]) 06/08/2012   Ischemic chest pain ([place]) 03/01/2012   AVM (arteriovenous malformation) of small bowel, acquired 01/20/2012   GERD (gastroesophageal reflux disease) 01/09/2012   HLD (hyperlipidemia) 01/05/2012   Atherosclerotic heart disease of native coronary artery without angina pectoris 12/16/2011   Anxiety disorder 05/04/2011   Anemia in chronic kidney disease 04/29/2011   ESRD (end stage renal disease) on dialysis ([place]) 04/29/2011   Gout 04/29/2011   Hypertensive chronic kidney disease with stage 5 chronic kidney disease or end stage renal disease ([place]) 04/29/2011    Orientation RESPIRATION BLADDER Height & Weight     Self, Time, Situation, Place  Normal  (On HD, does not produce urine) Weight: 120 lb (54.4 kg) Height:  '5\' 1"'$  (154.9 cm)  BEHAVIORAL SYMPTOMS/MOOD NEUROLOGICAL BOWEL NUTRITION STATUS      Continent Diet (heart healthy)  AMBULATORY STATUS COMMUNICATION OF NEEDS Skin   Limited Assist Verbally Normal                       Personal Care Assistance Level of Assistance  Bathing, Feeding, Dressing Bathing Assistance: Limited assistance Feeding assistance: Independent Dressing Assistance: Limited assistance     Functional Limitations Info  Sight, Hearing, Speech Sight Info: Adequate Hearing Info: Adequate Speech Info: Adequate    SPECIAL CARE FACTORS FREQUENCY  PT (By licensed PT)     PT Frequency: 5x/week              Contractures Contractures Info: Not present    Additional Factors Info  Code Status, Allergies, Psychotropic Code Status Info: Full code Allergies Info: amlodipine, aspirin, nitrofurantoin, bactrim, contrast media, iron, tylenol, gabapentin, iron sucrose, ranexa, sucroferric oxhydroxide dexiant, dydralazine, levaquin, morphine and related, pantoprazole, plavix, protonix, venofer Psychotropic Info: xanax         Current Medications  (04/21/2022):  This is the current hospital active medication list Current Facility-Administered Medications  Medication Dose Route Frequency Provider Last Rate Last Admin   acetaminophen (TYLENOL) tablet 650 mg  650 mg Oral Q6H PRN Zierle-Ghosh, Asia B, DO       Or   acetaminophen (TYLENOL) suppository 650 mg  650 mg Rectal Q6H PRN Zierle-Ghosh, Asia B, DO       ALPRAZolam (XANAX) tablet 0.5 mg  0.5 mg Oral BID PRN Zierle-Ghosh, Asia B, DO       amiodarone (PACERONE) tablet 200 mg  200 mg Oral Daily Zierle-Ghosh, Asia B, DO   200 mg at 04/21/22 1043   [START ON 04/22/2022] calcitRIOL (ROCALTROL) capsule 2.25 mcg  2.25 mcg Oral Q M,W,F-HD Gean Quint, MD       Chlorhexidine Gluconate Cloth 2 % PADS 6 each  6 each Topical Q0600 Gean Quint, MD       [START ON 04/22/2022] cinacalcet (SENSIPAR) tablet 30 mg  30 mg Oral Q M,W,F-HD Gean Quint, MD       [START ON 04/22/2022] Darbepoetin Alfa (ARANESP) injection 200 mcg  200 mcg Intravenous Q Fri-HD Gean Quint, MD       hydrOXYzine (ATARAX) tablet 100 mg  100 mg Oral QHS Zierle-Ghosh, Asia B, DO       levothyroxine (SYNTHROID) tablet 25 mcg  25 mcg Oral Daily Zierle-Ghosh, Asia B, DO  metoprolol tartrate (LOPRESSOR) tablet 12.5 mg  12.5 mg Oral BID Zierle-Ghosh, Asia B, DO   12.5 mg at 04/21/22 1043   [START ON 04/22/2022] midodrine (PROAMATINE) tablet 5-10 mg  5-10 mg Oral 3 times per day on Mon Wed Fri Zierle-Ghosh, [place] B, DO       morphine (PF) 2 MG/ML injection 2 mg  2 mg Intravenous Q2H PRN Zierle-Ghosh, Asia B, DO       ondansetron (ZOFRAN) tablet 4 mg  4 mg Oral Q6H PRN Zierle-Ghosh, Asia B, DO       Or   ondansetron (ZOFRAN) injection 4 mg  4 mg Intravenous Q6H PRN Zierle-Ghosh, Asia B, DO       oxyCODONE (Oxy IR/ROXICODONE) immediate release tablet 5 mg  5 mg Oral Q4H PRN Zierle-Ghosh, Asia B, DO       pantoprazole (PROTONIX) EC tablet 40 mg  40 mg Oral Daily Zierle-Ghosh, Asia B, DO   40 mg at 04/21/22 1043   rosuvastatin  (CRESTOR) tablet 10 mg  10 mg Oral Daily Zierle-Ghosh, Asia B, DO   10 mg at 04/21/22 1043   sevelamer carbonate (RENVELA) tablet 2,400 mg  2,400 mg Oral TID WC Zierle-Ghosh, Asia B, DO   2,400 mg at 04/21/22 1218   Current Outpatient Medications  Medication Sig Dispense Refill   ALPRAZolam (XANAX) 0.5 MG tablet Take 0.5 mg by mouth daily as needed.     amiodarone (PACERONE) 100 MG tablet Take 2 tablets (200 mg total) by mouth daily. 30 tablet 2   Aspirin 81 MG CAPS Take 1 capsule by mouth daily at 12 noon.     cyclobenzaprine (FLEXERIL) 5 MG tablet Take 5 mg by mouth 3 (three) times daily as needed.     hydrOXYzine (VISTARIL) 100 MG capsule Take 100 mg by mouth at bedtime.     levothyroxine (SYNTHROID) 25 MCG tablet Take 1 tablet (25 mcg total) by mouth daily. 90 tablet 0   metoprolol tartrate (LOPRESSOR) 25 MG tablet Take 0.5 tablets (12.5 mg total) by mouth 2 (two) times daily. 30 tablet 6   midodrine (PROAMATINE) 5 MG tablet Take 5-10 mg by mouth See admin instructions. 3 times a week on dialysis days     multivitamin (RENA-VIT) TABS tablet Take 1 tablet by mouth daily.     nitroGLYCERIN (NITROSTAT) 0.4 MG SL tablet Place 0.4 mg under the tongue every 5 (five) minutes as needed for chest pain.     omeprazole (PRILOSEC) 20 MG capsule Take 1 capsule (20 mg total) by mouth daily. (Patient taking differently: Take 40 mg by mouth daily as needed (acid reflux).) 90 capsule 3   rosuvastatin (CRESTOR) 10 MG tablet Take 1 tablet (10 mg total) by mouth daily. 90 tablet 3   sevelamer carbonate (RENVELA) 800 MG tablet Take 2,400 mg by mouth 3 (three) times daily.       Discharge Medications: Please see discharge summary for a list of discharge medications.  Relevant Imaging Results:  Relevant Lab Results:   Additional Information ss#[id number].  Dialysis patient MWF at [facility] (10:30am arrival time). Moderna COVID-19 Vaccine 10/04/2019 , 09/04/2019  Weylin Plagge, Clydene Pugh, LCSW

## 2022-04-21 NOTE — Assessment & Plan Note (Signed)
Continue Crestor 

## 2022-04-21 NOTE — ED Provider Notes (Signed)
I was asked to assist with IV placement as she lost her IV and has difficult IV access.   Angiocath insertion Performed by: Ephraim Hamburger  Consent: Verbal consent obtained. Risks and benefits: risks, benefits and alternatives were discussed Time out: Immediately prior to procedure a "time out" was called to verify the correct patient, procedure, equipment, support staff and site/side marked as required.  Preparation: Patient was prepped and draped in the usual sterile fashion.  Vein Location: right upper arm  Ultrasound Guided  Gauge: 20  Normal blood return and flush without difficulty Patient tolerance: Patient tolerated the procedure well with no immediate complications.     Sherwood Gambler, MD 04/21/22 [phone]

## 2022-04-21 NOTE — Assessment & Plan Note (Signed)
-   Hemodialysis Monday Wednesday Friday - Last dialyzed on 04/20/2022 - Continue Renvela - Consult nephro for routine dialysis - Likely with blood transfusion with dialysis as well - Continue to monitor

## 2022-04-21 NOTE — TOC Initial Note (Signed)
Transition of Care [facility]) - Initial/Assessment Note    Patient Details  Name: Shawna [name] MRN: [id number] Date of Birth: May 01, 1941  Transition of Care [facility]) CM/SW Contact:    Shade Flood, LCSW Phone Number: 04/21/2022, 3:09 PM  Clinical Narrative:                  Pt here observation status from home. PT recommending SNF rehab. Spoke with pt and her husband to assess and review PT recommendations. Pt agreeable to SNF. She states she has been at [facility] in the past and would like to return there.  TOC will refer and start insurance auth once pt has selected bed offer.  Will follow.  Expected Discharge Plan: Skilled Nursing Facility Barriers to Discharge: Continued Medical Work up   Patient Goals and CMS Choice Patient states their goals for this hospitalization and ongoing recovery are:: go to rehab CMS Medicare.gov Compare Post Acute Care list provided to:: Patient Choice offered to / list presented to : Patient  Expected Discharge Plan and Services Expected Discharge Plan: [place] In-house Referral: Clinical Social Work   Post Acute Care Choice: [place] Living arrangements for the past 2 months: [place]                                      Prior Living Arrangements/Services Living arrangements for the past 2 months: Single Family Home Lives with:: Spouse Patient language and need for interpreter reviewed:: Yes Do you feel safe going back to the place where you live?: Yes      Need for Family Participation in Patient Care: Yes (Comment) Care giver support system in place?: Yes (comment) Current home services: DME Criminal Activity/Legal Involvement Pertinent to Current Situation/Hospitalization: No - Comment as needed  Activities of Daily Living      Permission Sought/Granted Permission sought to share information with : Facility Art therapist granted to share information with : Yes,  Verbal Permission Granted     Permission granted to share info w AGENCY: SNF        Emotional Assessment   Attitude/Demeanor/Rapport: Engaged Affect (typically observed): Pleasant Orientation: : Oriented to Self, Oriented to Place, Oriented to  Time, Oriented to Situation Alcohol / Substance Use: Not Applicable Psych Involvement: No (comment)  Admission diagnosis:  Symptomatic anemia [D64.9] Patient Active Problem List   Diagnosis Date Noted   Otitis externa of left ear    Acute metabolic encephalopathy 36/64/4034   Acute encephalopathy 11/08/2021   NSTEMI, initial episode of care ([place]) 10/19/2021   Symptomatic anemia 09/09/2021   Ischemic cardiomyopathy    Pulmonary edema 07/16/2021   Paroxysmal Atrial fibrillation/Flutter 05/07/2021   Vitamin B deficiency 04/23/2021   Neuropathy 04/23/2021   Anemia due to chronic blood loss 04/13/2021   Left knee pain 03/15/2021   Moderate protein-calorie malnutrition ([place]) 02/27/2021   Hypokalemia 02/27/2021   COVID-19 virus infection 02/03/2021   Leukocytosis 02/03/2021   Elevated MCV 02/03/2021   Hypoalbuminemia due to protein-calorie malnutrition ([place]) 02/03/2021   Hypothyroidism (acquired) 02/03/2021   Myositis 12/03/2020   Ataxia 12/02/2020   Diabetes mellitus type 2 in nonobese ([place]) 11/10/2020   Acute pulmonary edema ([place]) 11/10/2020   Generalized weakness 11/09/2020   Dialysis AV fistula malfunction, initial encounter ([place])    Jugular vein occlusion, right (HCC)    Failure of surgically constructed arteriovenous fistula ([place]) 10/03/2020  Myoclonus 08/31/2020   Clotted renal dialysis AV graft, initial encounter ([place])    Hypertensive heart and chronic kidney disease with heart failure and stage 1 through stage 4 chronic kidney disease, or chronic kidney disease ([place])    Hemodialysis-associated hypotension    Acute hypoxemic respiratory failure ([place]) 06/14/2020   Hypertension 06/12/2020   Irritable bowel syndrome 02/25/2020    Adenomatous duodenal polyp 09/10/2019   History of GI bleed 09/10/2019   Angina pectoris ([place]) 06/05/2019   Chest pain 06/03/2019   Small intestinal bacterial overgrowth 05/14/2019   Iron deficiency anemia 04/02/2019   Acute respiratory failure with hypoxia ([place]) 12/25/2018   Elevated troponin 12/14/2018   Chest pain at rest 07/13/2018   Hand steal syndrome ([place]) 08/01/2017   Anemia 07/14/2017   Coronary artery disease 62/83/1517   Complication of vascular access for dialysis 03/19/2017   Preoperative clearance 01/25/2017   H/O non-ST elevation myocardial infarction (NSTEMI) 10/24/2016   Fluid overload 10/10/2016   Non-ST elevation (NSTEMI) myocardial infarction [facility])    Acute on chronic respiratory failure with hypoxia ([place])    Cardiac arrest [facility])    Palliative care encounter    Goals of care, counseling/discussion    Hypertensive crisis without congestive heart failure 05/09/2016   Flash pulmonary edema ([place]) 04/06/2016   Acute respiratory failure ([place]) 04/06/2016   History of colon cancer 01/27/2016   History of ovarian cancer 01/27/2016   Hypertensive urgency 01/27/2016   PAF (paroxysmal atrial fibrillation) ([place]) 10/14/2015   Malignant neoplasm of right ovary ([place]) 10/14/2015   SVT (supraventricular tachycardia) 09/08/2015   Influenza A 08/30/2015   Acute on chronic diastolic CHF (congestive heart failure) ([place]) 05/04/2015   Essential hypertension    Dyspnea    Chronic systolic heart failure ([place]) 03/22/2013   Occlusion and stenosis of carotid artery without mention of cerebral infarction 01/24/2013   Hx of CABG 07/05/2012   Carotid artery disease ([place]) 07/05/2012   Mitral regurgitation 06/12/2012   Non-STEMI (non-ST elevated myocardial infarction) ([place]) 06/08/2012   Ischemic chest pain ([place]) 03/01/2012   AVM (arteriovenous malformation) of small bowel, acquired 01/20/2012   GERD (gastroesophageal reflux disease) 01/09/2012   HLD (hyperlipidemia) 01/05/2012    Atherosclerotic heart disease of native coronary artery without angina pectoris 12/16/2011   Anxiety disorder 05/04/2011   Anemia in chronic kidney disease 04/29/2011   ESRD (end stage renal disease) on dialysis ([place]) 04/29/2011   Gout 04/29/2011   Hypertensive chronic kidney disease with stage 5 chronic kidney disease or end stage renal disease ([place]) 04/29/2011   PCP:  Practice, [facility]:   [facility] [address] [place] [address] Phone: [phone] Fax: [phone]  [facility] (ESRD Bundle Only) - [place], [address] [name] [address] Phone: [phone] Fax: [phone]     Social Determinants of Health ([place]) Interventions    Readmission Risk Interventions    11/12/2021    8:56 AM 10/20/2021    4:38 PM 07/23/2021    4:26 PM  Readmission Risk Prevention Plan  Transportation Screening Complete Complete Complete  Medication Review ([place]) Complete Complete Complete  PCP or Specialist appointment within 3-5 days of discharge Complete Complete Complete  HRI or Home Care Consult Complete Complete Complete  SW Recovery Care/Counseling Consult Complete Complete Complete  Palliative Care Screening Not Applicable Not Applicable Not Applicable  Skilled Nursing Facility Complete Not Applicable Not Applicable

## 2022-04-22 DIAGNOSIS — I5042 Chronic combined systolic (congestive) and diastolic (congestive) heart failure: Secondary | ICD-10-CM | POA: Diagnosis not present

## 2022-04-22 DIAGNOSIS — R410 Disorientation, unspecified: Secondary | ICD-10-CM | POA: Diagnosis not present

## 2022-04-22 DIAGNOSIS — N186 End stage renal disease: Secondary | ICD-10-CM

## 2022-04-22 DIAGNOSIS — Z8774 Personal history of (corrected) congenital malformations of heart and circulatory system: Secondary | ICD-10-CM

## 2022-04-22 DIAGNOSIS — Z8679 Personal history of other diseases of the circulatory system: Secondary | ICD-10-CM

## 2022-04-22 DIAGNOSIS — Z951 Presence of aortocoronary bypass graft: Secondary | ICD-10-CM

## 2022-04-22 DIAGNOSIS — E039 Hypothyroidism, unspecified: Secondary | ICD-10-CM

## 2022-04-22 DIAGNOSIS — I480 Paroxysmal atrial fibrillation: Secondary | ICD-10-CM

## 2022-04-22 DIAGNOSIS — R531 Weakness: Secondary | ICD-10-CM

## 2022-04-22 DIAGNOSIS — K219 Gastro-esophageal reflux disease without esophagitis: Secondary | ICD-10-CM

## 2022-04-22 DIAGNOSIS — E785 Hyperlipidemia, unspecified: Secondary | ICD-10-CM

## 2022-04-22 LAB — TYPE AND SCREEN
ABO/RH(D): O POS
Antibody Screen: NEGATIVE
Donor AG Type: NEGATIVE
Donor AG Type: NEGATIVE
Unit division: 0
Unit division: 0

## 2022-04-22 LAB — CBC
HCT: 27.9 % — ABNORMAL LOW (ref 36.0–46.0)
Hemoglobin: 9.4 g/dL — ABNORMAL LOW (ref 12.0–15.0)
MCH: 34.6 pg — ABNORMAL HIGH (ref 26.0–34.0)
MCHC: 33.7 g/dL (ref 30.0–36.0)
MCV: 102.6 fL — ABNORMAL HIGH (ref 80.0–100.0)
Platelets: 243 10*3/uL (ref 150–400)
RBC: 2.72 MIL/uL — ABNORMAL LOW (ref 3.87–5.11)
RDW: 20.5 % — ABNORMAL HIGH (ref 11.5–15.5)
WBC: 7.3 10*3/uL (ref 4.0–10.5)
nRBC: 0 % (ref 0.0–0.2)

## 2022-04-22 LAB — BPAM RBC
Blood Product Expiration Date: 202311082359
Blood Product Expiration Date: 202311082359
ISSUE DATE / TIME: 202310120843
ISSUE DATE / TIME: 202310121227
Unit Type and Rh: 5100
Unit Type and Rh: 5100

## 2022-04-22 LAB — RENAL FUNCTION PANEL
Albumin: 3 g/dL — ABNORMAL LOW (ref 3.5–5.0)
Anion gap: 15 (ref 5–15)
BUN: 70 mg/dL — ABNORMAL HIGH (ref 8–23)
CO2: 22 mmol/L (ref 22–32)
Calcium: 8.8 mg/dL — ABNORMAL LOW (ref 8.9–10.3)
Chloride: 95 mmol/L — ABNORMAL LOW (ref 98–111)
Creatinine, Ser: 9.15 mg/dL — ABNORMAL HIGH (ref 0.44–1.00)
GFR, Estimated: 4 mL/min — ABNORMAL LOW (ref >60)
Glucose, Bld: 120 mg/dL — ABNORMAL HIGH (ref 70–99)
Phosphorus: 5.8 mg/dL — ABNORMAL HIGH (ref 2.5–4.6)
Potassium: 4.6 mmol/L (ref 3.5–5.1)
Sodium: 132 mmol/L — ABNORMAL LOW (ref 135–145)

## 2022-04-22 LAB — HEPATITIS B SURFACE ANTIBODY, QUANTITATIVE: Hep B S AB Quant (Post): >1000 m[IU]/mL (ref >9.9)

## 2022-04-22 MED ORDER — CINACALCET HCL 30 MG PO TABS
ORAL_TABLET | ORAL | Status: AC
  Filled 2022-04-22: qty 1

## 2022-04-22 MED ORDER — MIDODRINE HCL 5 MG PO TABS
ORAL_TABLET | ORAL | Status: AC
  Administered 2022-04-22: 10 mg via ORAL
  Filled 2022-04-22: qty 2

## 2022-04-22 MED ORDER — CALCITRIOL 0.25 MCG PO CAPS
ORAL_CAPSULE | ORAL | Status: AC
  Administered 2022-04-22: 2.25 ug via ORAL
  Filled 2022-04-22: qty 8

## 2022-04-22 MED ORDER — PENTAFLUOROPROP-TETRAFLUOROETH EX AERO
INHALATION_SPRAY | CUTANEOUS | Status: AC
  Administered 2022-04-22: 1 via TOPICAL
  Filled 2022-04-22: qty 30

## 2022-04-22 MED ORDER — DARBEPOETIN ALFA 200 MCG/0.4ML IJ SOSY
PREFILLED_SYRINGE | INTRAMUSCULAR | Status: AC
  Administered 2022-04-22: 200 ug via INTRAVENOUS
  Filled 2022-04-22: qty 0.4

## 2022-04-22 MED ORDER — HYDROXYZINE HCL 25 MG PO TABS
25.0000 mg | ORAL_TABLET | Freq: Every evening | ORAL | Status: DC | PRN
  Administered 2022-04-22 – 2022-04-24 (×3): 25 mg via ORAL
  Filled 2022-04-22 (×3): qty 1

## 2022-04-22 NOTE — Progress Notes (Signed)
PROGRESS NOTE  Shawna [name] WCH:[id number] DOB: 15-Jan-1941   PCP: [facility]  Patient is from: Home.  Lives with husband.  Uses cane at baseline.  DOA: 04/20/2022 LOS: 0  Chief complaints Chief Complaint  Patient presents with   Weakness     Brief Narrative / Interim history: 81 year old F with PMH of ESRD on HD MWF, systolic CHF, CAD s/p CABG and stents, anemia of renal disease, paroxysmal A-fib not on anticoagulation, hypertension, anxiety and depression presenting with progressive generalized weakness for 2 to 3 days and admitted for symptomatic anemia.  Hgb 6.6 (baseline 8-9).  No report of melena or hematochezia.  Transfused 2 units with appropriate response.  Nephrology on board for ESRD.  Therapy recommended SNF.  TOC working on placement.  Subjective: Seen and examined earlier this morning.  Per overnight RN report, patient was confused and attempted to wander halls looking for husband after her husband left but easily redirectable.  Patient has no complaint this morning.  Feels better.  However, she is only oriented to herself.  She knows she is in the hospital but does not recall the name of the hospital or the town she is in.  Objective: Vitals:   04/22/22 1230 04/22/22 1300 04/22/22 1330 04/22/22 1400  BP: (!) 161/54 (!) 160/58 (!) 152/56 118/83  Pulse:      Resp: '18 19 18 20  '$ Temp:      TempSrc:      SpO2:      Weight:      Height:        Examination:  GENERAL: No apparent distress.  Nontoxic. HEENT: MMM.  Vision and hearing grossly intact.  NECK: Supple.  No apparent JVD.  RESP:  No IWOB.  Fair aeration bilaterally. CVS:  RRR. Heart sounds normal.  ABD/GI/GU: BS+. Abd soft, NTND.  MSK/EXT:  Moves extremities. No apparent deformity. No edema.  SKIN: no apparent skin lesion or wound NEURO: Awake, alert and oriented to self and being in the hospital.  No apparent focal neuro deficit. PSYCH: Calm. Normal affect.   Procedures:  None    Microbiology summarized: None  Assessment and plan: Active Problems:   HLD (hyperlipidemia)   GERD (gastroesophageal reflux disease)   Chronic combined systolic and diastolic CHF (congestive heart failure) (HCC)   Hx of CABG   ESRD (end stage renal disease) on dialysis (HCC)   PAF (paroxysmal atrial fibrillation) (HCC)   Generalized weakness   Hypothyroidism (acquired)   Symptomatic anemia   Delirium   History of CAD (coronary artery disease)  Symptomatic anemia of renal disease: Transfused 2 units with appropriate response.  FOBT negative. Recent Labs    11/17/21 0612 11/18/21 0500 11/19/21 0830 11/22/21 1843 01/19/22 0033 04/15/22 1710 04/20/22 2027 04/20/22 2335 04/21/22 0636 04/21/22 2200  HGB 9.3* 10.3* 10.2* 11.2* 8.4* 7.8* 6.6* 6.7* 6.7* 10.2*  -ESA per nephrology. -Monitor -Transfuse for Hgb < 8.0 given history of CAD  ESRD on HD MWF/hyperphosphatemia -HD and binders per nephrology  Chronic combined CHF: -Fluid management with  Paroxysmal A-fib: EKG features sinus bradycardia with chronic LBBB. -Continue amiodarone.  History of CAD/CABG and stents: No chest pain.  EKG features sinus bradycardia with chronic LBBB. -Continue home medication -Keep Hgb > 8.0.  Generalized weakness: Likely due to anemia and ESRD.  Uses cane at baseline. -PT/OT-recommended SNF  Delirium: Confused and disoriented but redirectable.  She is only oriented to self and "hospital" but not able to tell the name of the  hospital or the city.  No focal neurodeficit.  She is on Xanax and significant dose of Atarax.  Received Atarax 100 mg at night.  Did not receive Xanax here. -Reorientation and delirium precautions -Decrease Atarax to 25 mg at bedtime as needed.   -Avoid sedating medications  Hypothyroidism -Continue home Synthroid  Anxiety: -Decreased Atarax as above -Home Xanax as needed but not a great choice  GERD -Continue PPI  Body mass index is 24.4 kg/m.            DVT prophylaxis:  SCDs Start: 04/21/22 0426  Code Status: Full code Family Communication: None at bedside Level of care: Telemetry Status is: Observation The patient will require care spanning > 2 midnights and should be moved to inpatient because: Symptomatic anemia, generalized weakness and delirium    Final disposition: SNF Consultants:  Nephrology  Sch Meds:  Scheduled Meds:  amiodarone  200 mg Oral Daily   calcitRIOL  2.25 mcg Oral Q M,W,F-HD   Chlorhexidine Gluconate Cloth  6 each Topical Q0600   cinacalcet       cinacalcet  30 mg Oral Q M,W,F-HD   darbepoetin (ARANESP) injection - DIALYSIS  200 mcg Intravenous Q Fri-HD   levothyroxine  25 mcg Oral Daily   metoprolol tartrate  12.5 mg Oral BID   midodrine  5-10 mg Oral 3 times per day on Mon Wed Fri   pantoprazole  40 mg Oral Daily   rosuvastatin  10 mg Oral Daily   sevelamer carbonate  2,400 mg Oral TID WC   Continuous Infusions:  anticoagulant sodium citrate     PRN Meds:.acetaminophen **OR** acetaminophen, ALPRAZolam, alteplase, anticoagulant sodium citrate, cinacalcet, heparin, hydrOXYzine, lidocaine (PF), lidocaine-prilocaine, ondansetron **OR** ondansetron (ZOFRAN) IV, pentafluoroprop-tetrafluoroeth  Antimicrobials: Anti-infectives (From admission, onward)    None        I have personally reviewed the following labs and images: CBC: Recent Labs  Lab 04/15/22 1710 04/20/22 2027 04/20/22 2335 04/21/22 0636 04/21/22 2200  WBC 8.7 8.2 7.6 5.8 7.6  NEUTROABS 6.6  --  5.4 4.0  --   HGB 7.8* 6.6* 6.7* 6.7* 10.2*  HCT 24.5* 20.9* 22.0* 21.4* 30.5*  MCV 121.9* 121.5* 122.9* 121.6* 102.0*  PLT 234 135* 243 237 177   BMP &GFR Recent Labs  Lab 04/15/22 1710 04/20/22 2027 04/21/22 0636 04/21/22 2200 04/22/22 1200  NA 136 134* 135 134* 132*  K 3.5 3.7 4.3 4.8 4.6  CL 97* 97* 98 97* 95*  CO2 '27 25 25 22 22  '$ GLUCOSE 116* 110* 86 116* 120*  BUN 33* 33* 39* 56* 70*  CREATININE 5.43* 5.75*  6.49* 7.98* 9.15*  CALCIUM 9.0 8.4* 8.2* 8.6* 8.8*  MG  --   --  2.1  --   --   PHOS  --   --   --  5.6* 5.8*   Estimated Creatinine Clearance: 4.1 mL/min (A) (by C-G formula based on SCr of 9.15 mg/dL (H)). Liver & Pancreas: Recent Labs  Lab 04/15/22 1710 04/20/22 2027 04/21/22 0636 04/21/22 2200 04/22/22 1200  AST 9* 14* 16  --   --   ALT '12 14 12  '$ --   --   ALKPHOS 115 98 90  --   --   BILITOT 0.6 0.5 0.7  --   --   PROT 7.1 7.3 6.8  --   --   ALBUMIN 3.6 3.5 3.2* 3.3* 3.0*   No results for input(s): "LIPASE", "AMYLASE" in the last 168 hours. No results  for input(s): "AMMONIA" in the last 168 hours. Diabetic: No results for input(s): "HGBA1C" in the last 72 hours. Recent Labs  Lab 04/20/22 1826  GLUCAP 95   Cardiac Enzymes: No results for input(s): "CKTOTAL", "CKMB", "CKMBINDEX", "TROPONINI" in the last 168 hours. No results for input(s): "PROBNP" in the last 8760 hours. Coagulation Profile: No results for input(s): "INR", "PROTIME" in the last 168 hours. Thyroid Function Tests: No results for input(s): "TSH", "T4TOTAL", "FREET4", "T3FREE", "THYROIDAB" in the last 72 hours. Lipid Profile: No results for input(s): "CHOL", "HDL", "LDLCALC", "TRIG", "CHOLHDL", "LDLDIRECT" in the last 72 hours. Anemia Panel: Recent Labs    04/20/22 2335 04/21/22 0636  VITAMINB12 >7,500*  --   FOLATE >40.0  --   FERRITIN  --  912*  TIBC  --  255  IRON  --  58  RETICCTPCT 3.8* 3.7*   Urine analysis:    Component Value Date/Time   COLORURINE YELLOW 12/16/2012 1919   APPEARANCEUR CLOUDY (A) 12/16/2012 1919   LABSPEC 1.009 12/16/2012 1919   PHURINE 7.5 12/16/2012 1919   GLUCOSEU NEGATIVE 12/16/2012 1919   HGBUR TRACE (A) 12/16/2012 1919   BILIRUBINUR NEGATIVE 12/16/2012 1919   KETONESUR NEGATIVE 12/16/2012 1919   PROTEINUR 100 (A) 12/16/2012 1919   UROBILINOGEN 0.2 12/16/2012 1919   NITRITE NEGATIVE 12/16/2012 1919   LEUKOCYTESUR SMALL (A) 12/16/2012 1919   Sepsis  Labs: Invalid input(s): "PROCALCITONIN", "LACTICIDVEN"  Microbiology: No results found for this or any previous visit (from the past 240 hour(s)).  Radiology Studies: No results found.    Calel Pisarski T. Dougherty  If 7PM-7AM, please contact night-coverage [email or web address] 04/22/2022, 2:31 PM

## 2022-04-22 NOTE — Care Management Obs Status (Signed)
[place] NOTIFICATION   Patient Details  Name: Shawna [name] MRN: [id number] Date of Birth: 11-Jun-1941   Medicare Observation Status Notification Given:  Yes    [name] 04/22/2022, 11:27 AM

## 2022-04-22 NOTE — Progress Notes (Signed)
[facility] Progress Note    Assessment/ Plan:   ESRD:  -Outpatient orders: [facility], MWF.  3.5 hours.  300/500 flow rates.  2K/2.5 calcium.  EDW 59 kg.  Left upper extremity AVG, 15-gauge.  Meds: No heparin.  Mircera 200 mcg every 2 weeks (last dose 10/2), Venofer 50 mg q. weekly (last dose on 10/9), calcitriol 2.25 mcg 3 times weekly, Sensipar 30 mg 3 times weekly. -HD on MWF schedule, HD today   Generalized weakness -likely related to symptomatic anemia   Volume/ hypertension: EDW 59kg. Attempt to achieve EDW as tolerated   Acute on chronic anemia (symptomatic), Anemia of Chronic Kidney Disease: Hemoglobin 6.7 -FOBT neg, w/u per primary -will dose her ESA for today-earlier than anticipated (due for this next Monday) -no heparin -s/p 1u prbc, hgb 10.2 last night   Secondary Hyperparathyroidism/Hyperphosphatemia: resume home binders, calcitriol and sensipar resumed     Subjective:   No acute events, hgb up to 10.2 post PRBC. Patient reports that she feels like she has more energy now. Does report that she has some more fluid on board since transfusion which is expected. No other complaints.   Objective:   BP 136/63 (BP Location: Right Arm)   Pulse (!) 52   Temp (!) 97 F (36.1 C)   Resp 16   Ht '5\' 2"'$  (1.575 m)   Wt 54.4 kg   SpO2 98%   BMI 21.95 kg/m   Intake/Output Summary (Last 24 hours) at 04/22/2022 6213 Last data filed at 04/22/2022 0865 Gross per 24 hour  Intake 1018.33 ml  Output --  Net 1018.33 ml   Weight change: 0 kg  Physical Exam: Gen:NAD, sitting up in bed eating breakfast CVS:S1S2, rrr Resp:cta bl HQI:ONGE, nt/nd Ext:no sig edema Neuro: awake, alert Dialysis access: LUE AVG +b/t  Imaging: DG Chest Portable 1 View  Result Date: 04/20/2022 CLINICAL DATA:  Generalized weakness EXAM: PORTABLE CHEST 1 VIEW COMPARISON:  01/18/2022 FINDINGS: Transverse diameter of heart is increased. Coronary artery calcifications and possible  stents are noted. There is previous coronary artery bypass surgery. There are no signs of pulmonary edema or focal pulmonary consolidation. There are small linear densities in both lower lung fields suggesting scarring or subsegmental atelectasis. There is deformity in the right humeral head, possibly due to severe degenerative changes or avascular necrosis. Vascular stents are noted in the left upper arm. IMPRESSION: Cardiomegaly. Coronary artery disease. There are no signs of pulmonary edema or focal pulmonary consolidation. Small linear densities in lower lung fields may suggest scarring or subsegmental atelectasis. Electronically Signed   By: Elmer Picker M.D.   On: 04/20/2022 20:04    Labs: BMET Recent Labs  Lab 04/15/22 1710 04/20/22 2027 04/21/22 0636 04/21/22 2200  NA 136 134* 135 134*  K 3.5 3.7 4.3 4.8  CL 97* 97* 98 97*  CO2 '27 25 25 22  '$ GLUCOSE 116* 110* 86 116*  BUN 33* 33* 39* 56*  CREATININE 5.43* 5.75* 6.49* 7.98*  CALCIUM 9.0 8.4* 8.2* 8.6*  PHOS  --   --   --  5.6*   CBC Recent Labs  Lab 04/15/22 1710 04/20/22 2027 04/20/22 2335 04/21/22 0636 04/21/22 2200  WBC 8.7 8.2 7.6 5.8 7.6  NEUTROABS 6.6  --  5.4 4.0  --   HGB 7.8* 6.6* 6.7* 6.7* 10.2*  HCT 24.5* 20.9* 22.0* 21.4* 30.5*  MCV 121.9* 121.5* 122.9* 121.6* 102.0*  PLT 234 135* 243 237 177    Medications:  amiodarone  200 mg Oral Daily   calcitRIOL  2.25 mcg Oral Q M,W,F-HD   Chlorhexidine Gluconate Cloth  6 each Topical Q0600   cinacalcet  30 mg Oral Q M,W,F-HD   darbepoetin (ARANESP) injection - DIALYSIS  200 mcg Intravenous Q Fri-HD   hydrOXYzine  100 mg Oral QHS   levothyroxine  25 mcg Oral Daily   metoprolol tartrate  12.5 mg Oral BID   midodrine  5-10 mg Oral 3 times per day on Mon Wed Fri   pantoprazole  40 mg Oral Daily   rosuvastatin  10 mg Oral Daily   sevelamer carbonate  2,400 mg Oral TID WC      Gean Quint, MD [facility] 04/22/2022, 9:07 AM

## 2022-04-22 NOTE — Procedures (Signed)
   HEMODIALYSIS TREATMENT NOTE:  Difficult cannulation of AVG; two attempts each, ven and art end.  Unable to fully advance either needle due to loss of pulse.  Additionally, venous pressures continued to rise despite lowering of blood flow rate.  Discussed with Dr. Candiss Norse who will consult IR for declot.  Pre-HD K 4.7 today.  Able to remove 1 liter.  Post HD 136/84,  HR 58,  spO2 97% (room air),  weight 59.6kg (EDW 59).  Hand-off given to Vito Backers, RN.   Rockwell Alexandria, RN

## 2022-04-22 NOTE — Progress Notes (Signed)
Interventional Radiology Brief Note  IR consulted for difficultly with cannulation of LUE AVG. Machine giving high venous pressure alarms and she is unable to do full sessions of dialysis. She has a left upper arm graft placed in 2018. Last revised with interposition jump graft using 6 mm Gore limb Dec 2021. IR consulted for declotting procedure.   Spoke with patient's RN, Webb Silversmith.  She is aware of  plans for procedure Tuesday 10/17 at [facility].   RN to assist in arranging Carelink.   NPO p MN Tuesday.  Orders in place.   Brynda Greathouse, MS RD PA-C

## 2022-04-22 NOTE — TOC Progression Note (Signed)
Transition of Care [facility]) - Progression Note    Patient Details  Name: Shawna [name] MRN: [id number] Date of Birth: 06-08-1941  Transition of Care [facility]) CM/SW Contact  Shade Flood, LCSW Phone Number: 04/22/2022, 3:09 PM  Clinical Narrative:     TOC following. Spoke with pt's husband earlier today to review SNF bed offers. Pt/husband select [facility]. Insurance authorization was started and is still pending. [facility] states that they cannot take pt after 330 today and it is likely already too late in the day as they won't have time to get medications ordered even if the Josem Kaufmann is approved. Marita [place] at [facility] also states that they no longer accept admission on the weekend so pt would have to admit on Monday. Updated pt's husband and MD.  Will follow.  Expected Discharge Plan: [place] Barriers to Discharge: Continued Medical Work up  Expected Discharge Plan and Services Expected Discharge Plan: [place] In-house Referral: Clinical Social Work   Post Acute Care Choice: [place] Living arrangements for the past 2 months: [place] Determinants of Health (SDOH) Interventions    Readmission Risk Interventions    11/12/2021    8:56 AM 10/20/2021    4:38 PM 07/23/2021    4:26 PM  Readmission Risk Prevention Plan  Transportation Screening Complete Complete Complete  Medication Review Press photographer) Complete Complete Complete  PCP or Specialist appointment within 3-5 days of discharge Complete Complete Complete  HRI or Home Care Consult Complete Complete Complete  SW Recovery Care/Counseling Consult Complete Complete Complete  Palliative Care Screening Not Applicable Not Applicable Not [place] Complete Not Applicable Not Applicable

## 2022-04-22 NOTE — Progress Notes (Signed)
Patient resting quietly with eyes open, redirected throughout evening from attempting to wander halls looking for husband. Patient confused and pleasant redirected with activity I.e. TV and phone with call bell in reach and bed alarm applied.

## 2022-04-23 ENCOUNTER — Inpatient Hospital Stay (HOSPITAL_COMMUNITY): Payer: Medicare HMO

## 2022-04-23 DIAGNOSIS — I252 Old myocardial infarction: Secondary | ICD-10-CM | POA: Diagnosis not present

## 2022-04-23 DIAGNOSIS — F05 Delirium due to known physiological condition: Secondary | ICD-10-CM | POA: Diagnosis present

## 2022-04-23 DIAGNOSIS — G928 Other toxic encephalopathy: Secondary | ICD-10-CM | POA: Diagnosis present

## 2022-04-23 DIAGNOSIS — D649 Anemia, unspecified: Secondary | ICD-10-CM | POA: Diagnosis present

## 2022-04-23 DIAGNOSIS — E039 Hypothyroidism, unspecified: Secondary | ICD-10-CM | POA: Diagnosis present

## 2022-04-23 DIAGNOSIS — E875 Hyperkalemia: Secondary | ICD-10-CM | POA: Diagnosis not present

## 2022-04-23 DIAGNOSIS — R471 Dysarthria and anarthria: Secondary | ICD-10-CM

## 2022-04-23 DIAGNOSIS — T82318A Breakdown (mechanical) of other vascular grafts, initial encounter: Secondary | ICD-10-CM | POA: Diagnosis present

## 2022-04-23 DIAGNOSIS — N2581 Secondary hyperparathyroidism of renal origin: Secondary | ICD-10-CM | POA: Diagnosis present

## 2022-04-23 DIAGNOSIS — I4821 Permanent atrial fibrillation: Secondary | ICD-10-CM | POA: Diagnosis present

## 2022-04-23 DIAGNOSIS — D631 Anemia in chronic kidney disease: Secondary | ICD-10-CM | POA: Diagnosis present

## 2022-04-23 DIAGNOSIS — K219 Gastro-esophageal reflux disease without esophagitis: Secondary | ICD-10-CM | POA: Diagnosis present

## 2022-04-23 DIAGNOSIS — G253 Myoclonus: Secondary | ICD-10-CM | POA: Diagnosis present

## 2022-04-23 DIAGNOSIS — R531 Weakness: Secondary | ICD-10-CM | POA: Diagnosis not present

## 2022-04-23 DIAGNOSIS — R410 Disorientation, unspecified: Secondary | ICD-10-CM | POA: Diagnosis not present

## 2022-04-23 DIAGNOSIS — F419 Anxiety disorder, unspecified: Secondary | ICD-10-CM | POA: Diagnosis present

## 2022-04-23 DIAGNOSIS — R270 Ataxia, unspecified: Secondary | ICD-10-CM

## 2022-04-23 DIAGNOSIS — G931 Anoxic brain damage, not elsewhere classified: Secondary | ICD-10-CM | POA: Diagnosis not present

## 2022-04-23 DIAGNOSIS — E878 Other disorders of electrolyte and fluid balance, not elsewhere classified: Secondary | ICD-10-CM | POA: Diagnosis present

## 2022-04-23 DIAGNOSIS — E7800 Pure hypercholesterolemia, unspecified: Secondary | ICD-10-CM | POA: Diagnosis present

## 2022-04-23 DIAGNOSIS — I953 Hypotension of hemodialysis: Secondary | ICD-10-CM | POA: Diagnosis present

## 2022-04-23 DIAGNOSIS — I132 Hypertensive heart and chronic kidney disease with heart failure and with stage 5 chronic kidney disease, or end stage renal disease: Secondary | ICD-10-CM | POA: Diagnosis present

## 2022-04-23 DIAGNOSIS — Z79899 Other long term (current) drug therapy: Secondary | ICD-10-CM | POA: Diagnosis not present

## 2022-04-23 DIAGNOSIS — I5042 Chronic combined systolic (congestive) and diastolic (congestive) heart failure: Secondary | ICD-10-CM | POA: Diagnosis present

## 2022-04-23 DIAGNOSIS — Y828 Other medical devices associated with adverse incidents: Secondary | ICD-10-CM | POA: Diagnosis present

## 2022-04-23 DIAGNOSIS — E871 Hypo-osmolality and hyponatremia: Secondary | ICD-10-CM | POA: Diagnosis present

## 2022-04-23 DIAGNOSIS — N186 End stage renal disease: Secondary | ICD-10-CM | POA: Diagnosis present

## 2022-04-23 DIAGNOSIS — Z992 Dependence on renal dialysis: Secondary | ICD-10-CM | POA: Diagnosis not present

## 2022-04-23 LAB — COMPREHENSIVE METABOLIC PANEL
ALT: 13 U/L (ref 0–44)
AST: 15 U/L (ref 15–41)
Albumin: 2.9 g/dL — ABNORMAL LOW (ref 3.5–5.0)
Alkaline Phosphatase: 82 U/L (ref 38–126)
Anion gap: 18 — ABNORMAL HIGH (ref 5–15)
BUN: 74 mg/dL — ABNORMAL HIGH (ref 8–23)
CO2: 22 mmol/L (ref 22–32)
Calcium: 9.3 mg/dL (ref 8.9–10.3)
Chloride: 93 mmol/L — ABNORMAL LOW (ref 98–111)
Creatinine, Ser: 9.84 mg/dL — ABNORMAL HIGH (ref 0.44–1.00)
GFR, Estimated: 4 mL/min — ABNORMAL LOW (ref >60)
Glucose, Bld: 90 mg/dL (ref 70–99)
Potassium: 5.3 mmol/L — ABNORMAL HIGH (ref 3.5–5.1)
Sodium: 133 mmol/L — ABNORMAL LOW (ref 135–145)
Total Bilirubin: 1 mg/dL (ref 0.3–1.2)
Total Protein: 6.4 g/dL — ABNORMAL LOW (ref 6.5–8.1)

## 2022-04-23 LAB — RENAL FUNCTION PANEL
Albumin: 3.2 g/dL — ABNORMAL LOW (ref 3.5–5.0)
Anion gap: 15 (ref 5–15)
BUN: 66 mg/dL — ABNORMAL HIGH (ref 8–23)
CO2: 23 mmol/L (ref 22–32)
Calcium: 9.1 mg/dL (ref 8.9–10.3)
Chloride: 94 mmol/L — ABNORMAL LOW (ref 98–111)
Creatinine, Ser: 8.97 mg/dL — ABNORMAL HIGH (ref 0.44–1.00)
GFR, Estimated: 4 mL/min — ABNORMAL LOW (ref >60)
Glucose, Bld: 108 mg/dL — ABNORMAL HIGH (ref 70–99)
Phosphorus: 5.3 mg/dL — ABNORMAL HIGH (ref 2.5–4.6)
Potassium: 4.9 mmol/L (ref 3.5–5.1)
Sodium: 132 mmol/L — ABNORMAL LOW (ref 135–145)

## 2022-04-23 LAB — CBC
HCT: 29.5 % — ABNORMAL LOW (ref 36.0–46.0)
Hemoglobin: 9.7 g/dL — ABNORMAL LOW (ref 12.0–15.0)
MCH: 34.5 pg — ABNORMAL HIGH (ref 26.0–34.0)
MCHC: 32.9 g/dL (ref 30.0–36.0)
MCV: 105 fL — ABNORMAL HIGH (ref 80.0–100.0)
Platelets: 280 10*3/uL (ref 150–400)
RBC: 2.81 MIL/uL — ABNORMAL LOW (ref 3.87–5.11)
RDW: 19.8 % — ABNORMAL HIGH (ref 11.5–15.5)
WBC: 8.6 10*3/uL (ref 4.0–10.5)
nRBC: 0 % (ref 0.0–0.2)

## 2022-04-23 LAB — TSH: TSH: 4.796 u[IU]/mL — ABNORMAL HIGH (ref 0.350–4.500)

## 2022-04-23 LAB — AMMONIA: Ammonia: 30 umol/L (ref 9–35)

## 2022-04-23 LAB — T4, FREE: Free T4: 1.16 ng/dL — ABNORMAL HIGH (ref 0.61–1.12)

## 2022-04-23 MED ORDER — LABETALOL HCL 5 MG/ML IV SOLN
10.0000 mg | INTRAVENOUS | Status: DC | PRN
  Filled 2022-04-23: qty 4

## 2022-04-23 MED ORDER — SODIUM CHLORIDE 0.9 % IV SOLN
250.0000 mg | INTRAVENOUS | Status: DC
  Administered 2022-04-24: 250 mg via INTRAVENOUS
  Filled 2022-04-23 (×3): qty 2.5

## 2022-04-23 MED ORDER — CYCLOBENZAPRINE HCL 5 MG PO TABS
5.0000 mg | ORAL_TABLET | Freq: Three times a day (TID) | ORAL | Status: DC
  Administered 2022-04-23: 5 mg via ORAL
  Filled 2022-04-23: qty 1

## 2022-04-23 MED ORDER — SODIUM ZIRCONIUM CYCLOSILICATE 5 G PO PACK
5.0000 g | PACK | Freq: Every day | ORAL | Status: DC
  Administered 2022-04-23: 5 g via ORAL
  Filled 2022-04-23 (×2): qty 1

## 2022-04-23 MED ORDER — LORAZEPAM 2 MG/ML IJ SOLN
1.0000 mg | INTRAMUSCULAR | Status: DC | PRN
  Administered 2022-04-23 – 2022-04-24 (×2): 1 mg via INTRAVENOUS
  Filled 2022-04-23 (×2): qty 1

## 2022-04-23 MED ORDER — LEVETIRACETAM IN NACL 500 MG/100ML IV SOLN
500.0000 mg | INTRAVENOUS | Status: DC
  Filled 2022-04-23: qty 100

## 2022-04-23 MED ORDER — LORAZEPAM 2 MG/ML IJ SOLN
0.5000 mg | Freq: Once | INTRAMUSCULAR | Status: AC
  Administered 2022-04-23: 0.5 mg via INTRAVENOUS
  Filled 2022-04-23: qty 1

## 2022-04-23 NOTE — Progress Notes (Signed)
Report given to Mikey Kirschner at [facility].

## 2022-04-23 NOTE — Progress Notes (Signed)
Pt noted to have increased jerking movements MD notified and arrived bedside to assess pt.

## 2022-04-23 NOTE — Progress Notes (Addendum)
NEW ADMISSION NOTE New Admission Note:   Arrival Method: Patient transferred from [facility] and arrived with carelink to the unit. Mental Orientation: alert to self, confused, involuntary jerking movements noted, more visible in the face. Telemetry: 52M 22  Assessment: Completed Skin: dry and intact. IV: Right upper arm SL. Pain: Denies any pain. Tubes: N/A Safety Measures: Safety Fall Prevention Plan has been given, discussed and signed Admission: Completed 5 [place] Orientation: Patient has been orientated to the room, unit and staff, needs to reinforce. Family: None at bedside.  Orders have been reviewed and implemented. Will continue to monitor the patient. Call light has been placed within reach and bed alarm has been activated.   Amaryllis Dyke, RN

## 2022-04-23 NOTE — TOC Progression Note (Signed)
Transition of Care [facility]) - Progression Note    Patient Details  Name: Shawna [name] MRN: [id number] Date of Birth: 1941/05/04  Transition of Care [name]) CM/SW Contact  Shade Flood, LCSW Phone Number: 04/23/2022, 10:31 AM  Clinical Narrative:     TOC following. Pt has insurance auth for SNF. (29/24 - 46/28, certification # [id number]). [facility] cannot take on the weekend. Anticipating dc Monday.  Expected Discharge Plan: [place] Barriers to Discharge: Continued Medical Work up  Expected Discharge Plan and Services Expected Discharge Plan: [place] In-house Referral: Clinical Social Work   Post Acute Care Choice: [place] Living arrangements for the past 2 months: [place] Determinants of Health (SDOH) Interventions    Readmission Risk Interventions    11/12/2021    8:56 AM 10/20/2021    4:38 PM 07/23/2021    4:26 PM  Readmission Risk Prevention Plan  Transportation Screening Complete Complete Complete  Medication Review Press photographer) Complete Complete Complete  PCP or Specialist appointment within 3-5 days of discharge Complete Complete Complete  HRI or Home Care Consult Complete Complete Complete  SW Recovery Care/Counseling Consult Complete Complete Complete  Palliative Care Screening Not Applicable Not Applicable Not [place] Complete Not Applicable Not Applicable

## 2022-04-23 NOTE — Progress Notes (Addendum)
PROGRESS NOTE  Shawna [name] MLY:[id number] DOB: [birth year]   PCP: [facility]  Patient is from: Home.  Lives with husband.  Uses cane at baseline.  DOA: 04/20/2022 LOS: 0  Chief complaints Chief Complaint  Patient presents with   Weakness     Brief Narrative / Interim history: 81 year old F with PMH of ESRD on HD MWF, systolic CHF, CAD s/p CABG and stents, anemia of renal disease, paroxysmal A-fib not on anticoagulation, hypertension, anxiety and depression presenting with progressive generalized weakness for 2 to 3 days and admitted for symptomatic anemia.  Hgb 6.6 (baseline 8-9).  No report of melena or hematochezia.  Transfused 2 units with appropriate response.  Nephrology consulted for ESRD.  Unfortunately, HD truncated after removal of 1 L due to difficulty cannulating AVG. IR consulted nephrology.  Patient developed ataxia with jerking in upper extremities and face the morning of 10/14.  She is awake but only oriented to self. Seems to have some dysarthria with this, but no facial asymmetry.  Motor symmetric bilaterally in all extremities.  CT head without acute finding.  Discussed with on-call teleneurologist who recommended MRI brain that cannot be done at [facility] over the weekend.  On talking to patient's husband, he reports previous episode of ataxia for which she takes "spasm pills".  It seems she is on Flexeril at home.  Transferring patient to [facility] for MRI and neurology input.   Subjective: Seen and examined earlier this morning.  Subjective as above  Objective: Vitals:   04/23/22 0510 04/23/22 0637 04/23/22 1320 04/23/22 1323  BP: (!) 168/65  (!) 169/116 (!) 160/100  Pulse: 61  62   Resp: 20     Temp: 99.6 F (37.6 C)  98.7 F (37.1 C)   TempSrc: Oral  Oral   SpO2: 96%  95%   Weight:  59 kg    Height:        Examination:  GENERAL: No apparent distress.  Nontoxic. HEENT: MMM.  Vision and hearing grossly intact.  NECK: Supple.  No  apparent JVD.  RESP:  No IWOB.  Fair aeration bilaterally. CVS:  RRR. Heart sounds normal.  ABD/GI/GU: BS+. Abd soft, NTND.  MSK/EXT:  Moves extremities. No apparent deformity. No edema.  SKIN: no apparent skin lesion or wound NEURO: Awake but only oriented to self.  Dysarthria.  Head and upper extremity ataxia which is worse with intentional movement.  No facial asymmetry.  Motor 4/5 in all extremities.  Sensory intact. PSYCH: Calm. Normal affect.   Procedures:  None   Microbiology summarized: None  Assessment and plan: Active Problems:   HLD (hyperlipidemia)   GERD (gastroesophageal reflux disease)   Chronic combined systolic and diastolic CHF (congestive heart failure) (HCC)   Hx of CABG   ESRD (end stage renal disease) on dialysis (HCC)   PAF (paroxysmal atrial fibrillation) (HCC)   Generalized weakness   Hypothyroidism (acquired)   Symptomatic anemia   Delirium   History of CAD (coronary artery disease)  Delirium/ataxia: Some delirium the night of admission.  Atarax reduced to 25 mg at bedtime as needed.  She developed upper extremity and head ataxia the morning of 10/14.  She also has dysarthria but no facial asymmetry or other focal neurodeficit.  Differential includes cerebellar CVA, uremia from truncated HD although BUN only 66, withdrawal and hypothyroidism.  B12 high.  Seizure, infection and iatrogenic causes less likely.  CT head without acute finding. -Discussed with teleneurologist-suggested MRI brain.  Transfer to  [facility] for [facility] and neurology input -Check ammonia and TSH -Resume home Flexeril at 5 mg 3 times daily -Try 0.5 mg Ativan once    Symptomatic anemia of renal disease: Transfused 2 units with appropriate response.  FOBT negative. Recent Labs    11/19/21 0830 11/22/21 1843 01/19/22 0033 04/15/22 1710 04/20/22 2027 04/20/22 2335 04/21/22 0636 04/21/22 2200 04/22/22 1200 04/23/22 0951  HGB 10.2* 11.2* 8.4* 7.8* 6.6* 6.7* 6.7* 10.2* 9.4* 9.7*  -ESA  per nephrology. -Monitor -Transfuse for Hgb < 8.0 given history of CAD  ESRD on HD MWF/hyperphosphatemia -HD and binders per nephrology  Chronic combined CHF: Appears euvolemic on exam. -Fluid management with  Paroxysmal A-fib: EKG features sinus bradycardia with chronic LBBB. -Continue amiodarone.  Uncontrolled hypertension: BP slightly elevated but reading unreliable in the setting of ongoing ataxia -Continue home metoprolol  History of CAD/CABG and stents: No chest pain.  EKG features sinus bradycardia with chronic LBBB. -Continue home medication -Keep Hgb > 8.0.  Generalized weakness: Likely due to anemia and ESRD.  Uses cane at baseline. -PT/OT-recommended SNF  Hypothyroidism -Continue home Synthroid  Anxiety: -Decreased Atarax as above  GERD -Continue PPI  Body mass index is 23.79 kg/m.         DVT prophylaxis:  SCDs Start: 04/21/22 0426  Code Status: Full code Family Communication: Updated patient's husband over the phone. Level of care: Telemetry Status is: Observation The patient will require care spanning > 2 midnights and should be moved to inpatient because: Acute ataxia, delirium, symptomatic anemia, generalized weakness    Final disposition: SNF Consultants:  Nephrology  Sch Meds:  Scheduled Meds:  amiodarone  200 mg Oral Daily   calcitRIOL  2.25 mcg Oral Q M,W,F-HD   Chlorhexidine Gluconate Cloth  6 each Topical Q0600   cinacalcet  30 mg Oral Q M,W,F-HD   darbepoetin (ARANESP) injection - DIALYSIS  200 mcg Intravenous Q Fri-HD   levothyroxine  25 mcg Oral Daily   metoprolol tartrate  12.5 mg Oral BID   midodrine  5-10 mg Oral 3 times per day on Mon Wed Fri   pantoprazole  40 mg Oral Daily   rosuvastatin  10 mg Oral Daily   sevelamer carbonate  2,400 mg Oral TID WC   Continuous Infusions:  anticoagulant sodium citrate     PRN Meds:.acetaminophen **OR** acetaminophen, ALPRAZolam, alteplase, anticoagulant sodium citrate, heparin,  hydrOXYzine, lidocaine (PF), lidocaine-prilocaine, ondansetron **OR** ondansetron (ZOFRAN) IV, pentafluoroprop-tetrafluoroeth  Antimicrobials: Anti-infectives (From admission, onward)    None        I have personally reviewed the following labs and images: CBC: Recent Labs  Lab 04/20/22 2335 04/21/22 0636 04/21/22 2200 04/22/22 1200 04/23/22 0951  WBC 7.6 5.8 7.6 7.3 8.6  NEUTROABS 5.4 4.0  --   --   --   HGB 6.7* 6.7* 10.2* 9.4* 9.7*  HCT 22.0* 21.4* 30.5* 27.9* 29.5*  MCV 122.9* 121.6* 102.0* 102.6* 105.0*  PLT 243 237 177 243 280   BMP &GFR Recent Labs  Lab 04/20/22 2027 04/21/22 0636 04/21/22 2200 04/22/22 1200 04/23/22 0951  NA 134* 135 134* 132* 132*  K 3.7 4.3 4.8 4.6 4.9  CL 97* 98 97* 95* 94*  CO2 '25 25 22 22 23  '$ GLUCOSE 110* 86 116* 120* 108*  BUN 33* 39* 56* 70* 66*  CREATININE 5.75* 6.49* 7.98* 9.15* 8.97*  CALCIUM 8.4* 8.2* 8.6* 8.8* 9.1  MG  --  2.1  --   --   --   PHOS  --   --  5.6* 5.8* 5.3*   Estimated Creatinine Clearance: 3.8 mL/min (A) (by C-G formula based on SCr of 8.97 mg/dL (H)). Liver & Pancreas: Recent Labs  Lab 04/20/22 2027 04/21/22 0636 04/21/22 2200 04/22/22 1200 04/23/22 0951  AST 14* 16  --   --   --   ALT 14 12  --   --   --   ALKPHOS 98 90  --   --   --   BILITOT 0.5 0.7  --   --   --   PROT 7.3 6.8  --   --   --   ALBUMIN 3.5 3.2* 3.3* 3.0* 3.2*   No results for input(s): "LIPASE", "AMYLASE" in the last 168 hours. No results for input(s): "AMMONIA" in the last 168 hours. Diabetic: No results for input(s): "HGBA1C" in the last 72 hours. Recent Labs  Lab 04/20/22 1826  GLUCAP 95   Cardiac Enzymes: No results for input(s): "CKTOTAL", "CKMB", "CKMBINDEX", "TROPONINI" in the last 168 hours. No results for input(s): "PROBNP" in the last 8760 hours. Coagulation Profile: No results for input(s): "INR", "PROTIME" in the last 168 hours. Thyroid Function Tests: No results for input(s): "TSH", "T4TOTAL", "FREET4",  "T3FREE", "THYROIDAB" in the last 72 hours. Lipid Profile: No results for input(s): "CHOL", "HDL", "LDLCALC", "TRIG", "CHOLHDL", "LDLDIRECT" in the last 72 hours. Anemia Panel: Recent Labs    04/20/22 2335 04/21/22 0636  VITAMINB12 >7,500*  --   FOLATE >40.0  --   FERRITIN  --  912*  TIBC  --  255  IRON  --  58  RETICCTPCT 3.8* 3.7*   Urine analysis:    Component Value Date/Time   COLORURINE YELLOW 12/16/2012 1919   APPEARANCEUR CLOUDY (A) 12/16/2012 1919   LABSPEC 1.009 12/16/2012 1919   PHURINE 7.5 12/16/2012 1919   GLUCOSEU NEGATIVE 12/16/2012 1919   HGBUR TRACE (A) 12/16/2012 1919   BILIRUBINUR NEGATIVE 12/16/2012 1919   KETONESUR NEGATIVE 12/16/2012 1919   PROTEINUR 100 (A) 12/16/2012 1919   UROBILINOGEN 0.2 12/16/2012 1919   NITRITE NEGATIVE 12/16/2012 1919   LEUKOCYTESUR SMALL (A) 12/16/2012 1919   Sepsis Labs: Invalid input(s): "PROCALCITONIN", "LACTICIDVEN"  Microbiology: No results found for this or any previous visit (from the past 240 hour(s)).  Radiology Studies: CT HEAD WO CONTRAST (5MM)  Result Date: 04/23/2022 CLINICAL DATA:  Ataxia.  Jerking movements in the left arm. EXAM: CT HEAD WITHOUT CONTRAST TECHNIQUE: Contiguous axial images were obtained from the base of the skull through the vertex without intravenous contrast. RADIATION DOSE REDUCTION: This exam was performed according to the departmental dose-optimization program which includes automated exposure control, adjustment of the mA and/or kV according to patient size and/or use of iterative reconstruction technique. COMPARISON:  11/09/2021 FINDINGS: Brain: No evidence of acute infarction, hemorrhage, hydrocephalus, extra-axial collection or mass lesion/mass effect. Generalized brain atrophy and confluent chronic small vessel ischemia. Remote lacunar infarct at the posterior left detained min. Vascular: No hyperdense vessel or unexpected calcification. Skull: Normal. Negative for fracture or focal  lesion. Sinuses/Orbits: Unremarkable. IMPRESSION: 1. No acute or reversible finding. 2. Brain atrophy and extensive chronic small vessel ischemia without interval change. Electronically Signed   By: Jorje Guild M.D.   On: 04/23/2022 12:09      Keelyn Fjelstad T. Onton  If 7PM-7AM, please contact night-coverage [email or web address] 04/23/2022, 1:25 PM

## 2022-04-23 NOTE — Progress Notes (Signed)
Pt noted to have jerking movements in left arm upon assessment. MD notified

## 2022-04-23 NOTE — Consult Note (Addendum)
Neurology Consultation Reason for Consult: New onset myoclonus Requesting Physician: Wendee Beavers  CC: New onset myoclonus  History is obtained from: Patient and chart review  HPI: Shawna [name] is a 81 y.o. female with a past medical history significant for ESRD on IHD Monday Wednesday Friday, congestive heart failure, coronary artery disease s/p CABG, atrial fibrillation not on anticoagulation, hypertension, anxiety/depression  She was admitted with symptomatic anemia with a hemoglobin of 6.6 for which she received 2 units of packed red blood cells for goal hemoglobin of 8 due to history of coronary artery disease.  Her hospital course was complicated by some delirium for which her home Atarax was reduced from 100 mg nightly to 25 mg nightly.  She was continued on her home amiodarone 200 mg daily which she appears to have been on for some time.  She did have a truncated hemodialysis session on 10/13 (Friday, her last regularly scheduled session due to difficulty cannulating her AVG)  At [facility] she was given Zofran at 1329 followed by Ativan at 1347 and Flexeril at [place] does feel that the Ativan was somewhat useful in controlling her movement  LKW: Evening of 10/13 Thrombolytic given?: No, out of the window IA performed?: No, exam not consistent with LVO  ROS: Limited secondary to mild delirium but she denies any other acute complaints, just states she is "falling apart"   Past medical and surgical history reviewed with relevant information in HPI above.  Family history reviewed and notable for extensive family history of hypertension, hyperlipidemia and diabetes  Social History:  reports that she has never smoked. She has never used smokeless tobacco. She reports that she does not drink alcohol and does not use drugs.   Exam: Current vital signs: BP (!) 156/107 (BP Location: Right Arm)   Pulse (!) 57   Temp 98.5 F (36.9 C) (Oral)   Resp 19   Ht '5\' 2"'$  (1.575 m)    Wt 60.8 kg   SpO2 96%   BMI 24.52 kg/m  Vital signs in last 24 hours: Temp:  [98.3 F (36.8 C)-99.6 F (37.6 C)] 98.5 F (36.9 C) (10/14 1806) Pulse Rate:  [57-62] 57 (10/14 1806) Resp:  [19-20] 19 (10/14 1806) BP: (138-170)/(65-116) 156/107 (10/14 1806) SpO2:  [94 %-98 %] 96 % (10/14 1806) Weight:  [59 kg-60.8 kg] 60.8 kg (10/14 1800)   Physical Exam  Constitutional: Appears chronically ill Psych: Affect appropriate to situation, pleasant and cooperative Eyes: No scleral injection HENT: No oropharyngeal obstruction.  MSK: no joint deformities.  Cardiovascular: Perfusing extremities well Respiratory: Effort normal, non-labored breathing GI: Soft.  No distension. There is no tenderness.  Skin: Warm dry and intact visible skin  Neuro: Mental Status: Patient is awake, alert, oriented to person, place, month, age and some details of her situation but give limited history No signs of aphasia or neglect Cranial Nerves: II: Visual Fields are full. Pupils are equal, round, and reactive to light.   III,IV, VI: EOMI without ptosis or diploplia.  V: Facial sensation is symmetric to light touch and temperature VII: Facial movement is symmetric.  VIII: hearing is intact to voice X: Uvula elevates symmetrically XI: Shoulder shrug is symmetric. XII: tongue is midline without atrophy or fasciculations.  Motor: She has notable asterixis of the bilateral upper extremities and myoclonic facial movements which at times affect her speech.  Bilateral upper extremities are at least 4+/5 throughout, limited secondary to her asterixis.  hip flexion 4+ on the right  and 4 - on the left, with the remainder of her lower extremities 5/5 throughout Sensory: Sensation is symmetric to light touch and temperature in the arms and legs Deep Tendon Reflexes: Absent patellar reflexes, 1+ brachioradialis bilaterally Cerebellar: Finger-nose intact within limits of her myoclonus   NIHSS total 4 Score  breakdown: 2 for left upper extremity, 2 for right upper extremity secondary to asterixis Performed at 6:50 PM   I have reviewed labs in epic and the results pertinent to this consultation are:  Basic Metabolic Panel: Recent Labs  Lab 04/20/22 2027 04/21/22 0636 04/21/22 2200 04/22/22 1200 04/23/22 0951  NA 134* 135 134* 132* 132*  K 3.7 4.3 4.8 4.6 4.9  CL 97* 98 97* 95* 94*  CO2 '25 25 22 22 23  '$ GLUCOSE 110* 86 116* 120* 108*  BUN 33* 39* 56* 70* 66*  CREATININE 5.75* 6.49* 7.98* 9.15* 8.97*  CALCIUM 8.4* 8.2* 8.6* 8.8* 9.1  MG  --  2.1  --   --   --   PHOS  --   --  5.6* 5.8* 5.3*    CBC: Recent Labs  Lab 04/20/22 2335 04/21/22 0636 04/21/22 2200 04/22/22 1200 04/23/22 0951  WBC 7.6 5.8 7.6 7.3 8.6  NEUTROABS 5.4 4.0  --   --   --   HGB 6.7* 6.7* 10.2* 9.4* 9.7*  HCT 22.0* 21.4* 30.5* 27.9* 29.5*  MCV 122.9* 121.6* 102.0* 102.6* 105.0*  PLT 243 237 177 243 280    Coagulation Studies: No results for input(s): "LABPROT", "INR" in the last 72 hours.    I have reviewed the images obtained:  Head CT personally reviewed, agree with radiology no clear acute intracranial process   Impression: Examination is consistent with a segmental myoclonus involving the face and bilateral upper extremities but sparing the lower extremities. Response to Ativan is consistent with this diagnosis, but clonazepam, Keppra or Depakote would be preferable agents due to longer action and less likely to be deliriogenic  Differential for myoclonus is broad.  On review, for this patient could consider the role of dialysis syndrome, electrolyte derangements, amiodarone, withdrawal from sedatives (if she was getting Atarax 100 mg every night at home).  Structural etiologies can include stroke (possibly less likely given bilateral upper extremity involvement), or spinal cord pathology (again seems less likely given patient is denying any neck pain).  Query whether she may have had some cerebral  hypoxia secondary to her anemia playing a role as well  Recommendations: -MRI brain and cervical spine to rule out new structural lesions given segmental presentation affecting face and arms -Consider holding amiodarone if able, noting that metoprolol would be contraindicated due to her bradycardia I have not discontinued this medication -For symptomatic relief I will try Keppra 250 mg every 24 hours (specifically for myoclonic movements and not for seizure) -Discontinued Xanax, okay with spot doses of Ativan for severe symptoms -I will also hold Flexeril as this can also exacerbate myoclonus -This is clearly a movement/toxic metabolic disorder and I do not feel there is a role for EEG at this time -Appreciate nephrology assistance with dialysis to help clear any toxic/metabolic issues including her mild uremia, mild hyponatremia, hypochloremia, -Appreciate primary team management of comorbidities -Neurology will continue to follow  Lesleigh Noe MD-PhD [facility] [phone] Available 7 AM to 7 PM, outside these hours please contact Neurologist on call listed on AMION

## 2022-04-24 ENCOUNTER — Inpatient Hospital Stay (HOSPITAL_COMMUNITY): Payer: Medicare HMO

## 2022-04-24 DIAGNOSIS — N186 End stage renal disease: Secondary | ICD-10-CM | POA: Diagnosis not present

## 2022-04-24 DIAGNOSIS — Z992 Dependence on renal dialysis: Secondary | ICD-10-CM

## 2022-04-24 DIAGNOSIS — R531 Weakness: Secondary | ICD-10-CM | POA: Diagnosis not present

## 2022-04-24 DIAGNOSIS — G253 Myoclonus: Secondary | ICD-10-CM | POA: Diagnosis not present

## 2022-04-24 DIAGNOSIS — R410 Disorientation, unspecified: Secondary | ICD-10-CM | POA: Diagnosis not present

## 2022-04-24 LAB — RENAL FUNCTION PANEL
Albumin: 3 g/dL — ABNORMAL LOW (ref 3.5–5.0)
Anion gap: 20 — ABNORMAL HIGH (ref 5–15)
BUN: 88 mg/dL — ABNORMAL HIGH (ref 8–23)
CO2: 20 mmol/L — ABNORMAL LOW (ref 22–32)
Calcium: 9.1 mg/dL (ref 8.9–10.3)
Chloride: 93 mmol/L — ABNORMAL LOW (ref 98–111)
Creatinine, Ser: 11.05 mg/dL — ABNORMAL HIGH (ref 0.44–1.00)
GFR, Estimated: 3 mL/min — ABNORMAL LOW (ref >60)
Glucose, Bld: 85 mg/dL (ref 70–99)
Phosphorus: 7.1 mg/dL — ABNORMAL HIGH (ref 2.5–4.6)
Potassium: 5.6 mmol/L — ABNORMAL HIGH (ref 3.5–5.1)
Sodium: 133 mmol/L — ABNORMAL LOW (ref 135–145)

## 2022-04-24 LAB — CBC
HCT: 28.4 % — ABNORMAL LOW (ref 36.0–46.0)
Hemoglobin: 9.6 g/dL — ABNORMAL LOW (ref 12.0–15.0)
MCH: 34.3 pg — ABNORMAL HIGH (ref 26.0–34.0)
MCHC: 33.8 g/dL (ref 30.0–36.0)
MCV: 101.4 fL — ABNORMAL HIGH (ref 80.0–100.0)
Platelets: 172 10*3/uL (ref 150–400)
RBC: 2.8 MIL/uL — ABNORMAL LOW (ref 3.87–5.11)
RDW: 18.4 % — ABNORMAL HIGH (ref 11.5–15.5)
WBC: 8 10*3/uL (ref 4.0–10.5)
nRBC: 0 % (ref 0.0–0.2)

## 2022-04-24 LAB — MAGNESIUM: Magnesium: 1.8 mg/dL (ref 1.7–2.4)

## 2022-04-24 MED ORDER — AMIODARONE HCL 200 MG PO TABS
100.0000 mg | ORAL_TABLET | Freq: Every day | ORAL | Status: DC
  Administered 2022-04-25 – 2022-04-26 (×2): 100 mg via ORAL
  Filled 2022-04-24 (×2): qty 1

## 2022-04-24 MED ORDER — SODIUM ZIRCONIUM CYCLOSILICATE 10 G PO PACK
10.0000 g | PACK | Freq: Two times a day (BID) | ORAL | Status: DC
  Administered 2022-04-24 (×2): 10 g via ORAL
  Filled 2022-04-24 (×2): qty 1

## 2022-04-24 MED ORDER — CAMPHOR-MENTHOL 0.5-0.5 % EX LOTN
TOPICAL_LOTION | CUTANEOUS | Status: DC | PRN
  Filled 2022-04-24: qty 222

## 2022-04-24 NOTE — Progress Notes (Signed)
Neurology Progress Note  Brief HPI: With a history of end-stage renal disease on dialysis, CHF, CAD with CABG, A-fib not on anticoagulation, hypertension, anxiety and depression presented originally with symptomatic anemia with hemoglobin of 6.6 and was transfused with packed red blood cells.  Patient's hemodialysis session on Friday was truncated due to difficulties with access.  Patient now presents with new onset of segmental myoclonus in the face and bilateral arms.    Subjective: Patient is very drowsy this morning secondary to Ativan administration (1 mg) for MRI.   Unfortunately despite this medication MRI was limited due to patient being combative Minimal myoclonus observed this morning. She did receive 250 mg of Keppra the evening of 10/14 for myoclonus management  Exam: Vitals:   04/23/22 2247 04/24/22 0456  BP: 127/65 (!) 150/75  Pulse: 61 65  Resp: 17 16  Temp: 98.4 F (36.9 C) 98.5 F (36.9 C)  SpO2: 100% 100%   Gen: In bed, NAD Resp: non-labored breathing, no acute distress  Neuro: Mental Status: Very drowsy and falls asleep during exam. Requires repeated stimulation to complete exam.  Oriented to self only Cranial Nerves: Pupils equal round reactive to light, extraocular movements intact, facial sensation symmetric to light touch, facial movements symmetric, hearing intact to voice, phonation is normal, shoulder shrug is symmetric and tongue is midline Motor: All extremities to command.  Good antigravity strength in upper extremities.  Unable to lift lower extremities off of bed but unclear if this is effort dependent. No myoclonic movements observed Sensory: Reacts equally to light touch in all extremities DTR: Unable to elicit Gait: Deferred  Pertinent Labs:    Latest Ref Rng & Units 04/23/2022    9:51 AM 04/22/2022   12:00 PM 04/21/2022   10:00 PM  CBC  WBC 4.0 - 10.5 K/uL 8.6  7.3  7.6   Hemoglobin 12.0 - 15.0 g/dL 9.7  9.4  10.2   Hematocrit 36.0 - 46.0 %  29.5  27.9  30.5   Platelets 150 - 400 K/uL 280  243  177        Latest Ref Rng & Units 04/23/2022    8:54 PM 04/23/2022    9:51 AM 04/22/2022   12:00 PM  BMP  Glucose 70 - 99 mg/dL 90  108  120   BUN 8 - 23 mg/dL 74  66  70   Creatinine 0.44 - 1.00 mg/dL 9.84  8.97  9.15   Sodium 135 - 145 mmol/L 133  132  132   Potassium 3.5 - 5.1 mmol/L 5.3  4.9  4.6   Chloride 98 - 111 mmol/L 93  94  95   CO2 22 - 32 mmol/L '22  23  22   '$ Calcium 8.9 - 10.3 mg/dL 9.3  9.1  8.8      Imaging Reviewed:  MRI brain: No acute abnormality.  Small vessel disease and volume loss, motion degraded  MRI C-spine reviewed, no acute abnormality, though again motion degraded  Assessment: 81 year old patient with history of end-stage renal disease on dialysis, CHF, CAD with CABG, A-fib not on anticoagulation rate controlled with metoprolol and amiodarone, hypertension, anxiety, and depression presented initially with anemia and now has myoclonic movements of bilateral upper extremities and face.  Movements do improve with Ativan plus minus improvement with Keppra.    Differential is broad, and may include electrolyte derangements use of amiodarone withdrawal from sedatives as patient's dose of Atarax was recently reduced.  Cerebral hypoxia secondary to anemia may  have also played a role.  Fortunately imaging reveals no acute structural abnormality within limits of motion, though she remains very sedated secondary to Ativan that she received for imaging  At this time unclear if her movements have improved to Ativan (given for MRI) or Keppra or combination of the two.  Given she is so sedated will discontinue Keppra for now and continue to monitor; hopefully toxic/metabolic issues will improve with dialysis planned for tomorrow  Impression: Segmental myoclonus involving face and bilateral upper extremities and patient with multiple possible causes for this disorder  Recommendations: -Discussed with Dr. Verlon Au he  will attempt to reduce amiodarone -Given patient's sedated state will discontinue Keppra for now  -[place] nephrology input and dialysis to clear toxic metabolic issues -Management of comorbidities per primary team -Neurology will follow    T[place]ia , MSN, AGACNP-BC [facility] See Amion for schedule and pager information 04/24/2022 9:42 AM  Lesleigh Noe MD-PhD [facility] [phone] Available 7 AM to 7 PM, outside these hours please contact Neurologist on call listed on AMION

## 2022-04-24 NOTE — Progress Notes (Signed)
Patient ID: Shawna [name], female   DOB: [birth year], 81 y.o.   MRN: [id number] S: Pt transferred from [facility] to [facility] due to new onset myoclonus and need for MRI as well as IR consult for shuntogram due to poorly functioning AVG.  MRI of cervical spine truncated due to patient agitation.  Pt lethargic but arouseable. O:BP (!) 142/75 (BP Location: Right Arm)   Pulse 66   Temp 98.6 F (37 C) (Oral)   Resp 18   Ht '5\' 2"'$  (1.575 m)   Wt 60.8 kg   SpO2 98%   BMI 24.52 kg/m   Intake/Output Summary (Last 24 hours) at 04/24/2022 1130 Last data filed at 04/24/2022 0841 Gross per 24 hour  Intake 223 ml  Output 0 ml  Net 223 ml   Intake/Output: I/O last 3 completed shifts: In: 403 [P.O.:300; IV Piggyback:103] Out: 0   Intake/Output this shift:  No intake/output data recorded. Weight change: 0.3 kg KDT:OIZTIWP in bed in NAD CVS: RRR Resp: CTA Abd: +BS, soft, NT/ND Ext: no edema, LUE AVG +T/B  Recent Labs  Lab 04/20/22 2027 04/21/22 0636 04/21/22 2200 04/22/22 1200 04/23/22 0951 04/23/22 2054  NA 134* 135 134* 132* 132* 133*  K 3.7 4.3 4.8 4.6 4.9 5.3*  CL 97* 98 97* 95* 94* 93*  CO2 '25 25 22 22 23 22  '$ GLUCOSE 110* 86 116* 120* 108* 90  BUN 33* 39* 56* 70* 66* 74*  CREATININE 5.75* 6.49* 7.98* 9.15* 8.97* 9.84*  ALBUMIN 3.5 3.2* 3.3* 3.0* 3.2* 2.9*  CALCIUM 8.4* 8.2* 8.6* 8.8* 9.1 9.3  PHOS  --   --  5.6* 5.8* 5.3*  --   AST 14* 16  --   --   --  15  ALT 14 12  --   --   --  13   Liver Function Tests: Recent Labs  Lab 04/20/22 2027 04/21/22 0636 04/21/22 2200 04/22/22 1200 04/23/22 0951 04/23/22 2054  AST 14* 16  --   --   --  15  ALT 14 12  --   --   --  13  ALKPHOS 98 90  --   --   --  82  BILITOT 0.5 0.7  --   --   --  1.0  PROT 7.3 6.8  --   --   --  6.4*  ALBUMIN 3.5 3.2*   < > 3.0* 3.2* 2.9*   < > = values in this interval not displayed.   No results for input(s): "LIPASE", "AMYLASE" in the last 168 hours. Recent Labs  Lab 04/23/22 1415  AMMONIA 30    CBC: Recent Labs  Lab 04/20/22 2335 04/21/22 0636 04/21/22 2200 04/22/22 1200 04/23/22 0951  WBC 7.6 5.8 7.6 7.3 8.6  NEUTROABS 5.4 4.0  --   --   --   HGB 6.7* 6.7* 10.2* 9.4* 9.7*  HCT 22.0* 21.4* 30.5* 27.9* 29.5*  MCV 122.9* 121.6* 102.0* 102.6* 105.0*  PLT 243 237 177 243 280   Cardiac Enzymes: No results for input(s): "CKTOTAL", "CKMB", "CKMBINDEX", "TROPONINI" in the last 168 hours. CBG: Recent Labs  Lab 04/20/22 1826  GLUCAP 95    Iron Studies: No results for input(s): "IRON", "TIBC", "TRANSFERRIN", "FERRITIN" in the last 72 hours. Studies/Results: MR CERVICAL SPINE WO CONTRAST  Result Date: 04/24/2022 CLINICAL DATA:  81 year old female with ataxia. Combative. Confusion and weakness. Dialysis patient. EXAM: MRI CERVICAL SPINE WITHOUT CONTRAST TECHNIQUE: Multiplanar, multisequence MR imaging of the cervical spine  was performed. No intravenous contrast was administered. COMPARISON:  Brain MRI 04/23/2022. Chest CT 07/16/2021. FINDINGS: Despite anxiolysis medication administration the examination had to be discontinued prior to completion due to patient combativeness. No axial imaging obtained, and sagittal imaging is motion degraded. Alignment: Maintained cervical lordosis. Vertebrae: No marrow edema or acute osseous abnormality is evident. Some cervical spine endplate degeneration, most apparent at C6-C7. Cord: Grossly normal but not well evaluated. The visible upper thoracic spinal canal is capacious. Posterior Fossa, vertebral arteries, paraspinal tissues: Cervicomedullary junction is within normal limits. Brain MRI detailed yesterday. Motion degraded but grossly negative visible neck soft tissues. Questionable abnormal signal in the posterior right upper lobe lung apex visible on both T2 and STIR (series 5, image 1). But on comparison to the January chest CT this appears stable and related to chronic subpleural scarring. Disc levels: Cervical spine disc, endplate, and  posterior element degeneration is apparent on this sagittal only exam, but with only mild if any cervical spinal stenosis suspected. IMPRESSION: 1. Truncated and motion degraded exam due to patient combativeness. 2. Cervical spine degeneration seems fairly appropriate for age. No advanced cervical spinal stenosis or definite cervical cord abnormality. And Capacious visible upper thoracic spinal canal. Electronically Signed   By: Genevie Ann M.D.   On: 04/24/2022 09:32   MR BRAIN WO CONTRAST  Result Date: 04/23/2022 CLINICAL DATA:  Ataxia EXAM: MRI HEAD WITHOUT CONTRAST TECHNIQUE: Multiplanar, multiecho pulse sequences of the brain and surrounding structures were obtained without intravenous contrast. COMPARISON:  None Available. FINDINGS: Motion degraded examination. Brain: No acute infarct, mass effect or extra-axial collection. No acute or chronic hemorrhage. There is multifocal hyperintense T2-weighted signal within the white matter. Generalized volume loss. The midline structures are normal. Vascular: Major flow voids are preserved. Skull and upper cervical spine: Normal calvarium and skull base. Visualized upper cervical spine and soft tissues are normal. Sinuses/Orbits:No paranasal sinus fluid levels or advanced mucosal thickening. No mastoid or middle ear effusion. Normal orbits. IMPRESSION: 1. Motion degraded examination. 2. No acute intracranial abnormality. 3. Findings of chronic small vessel ischemia and volume loss. Electronically Signed   By: Ulyses Jarred M.D.   On: 04/23/2022 23:01   CT HEAD WO CONTRAST (5MM)  Result Date: 04/23/2022 CLINICAL DATA:  Ataxia.  Jerking movements in the left arm. EXAM: CT HEAD WITHOUT CONTRAST TECHNIQUE: Contiguous axial images were obtained from the base of the skull through the vertex without intravenous contrast. RADIATION DOSE REDUCTION: This exam was performed according to the departmental dose-optimization program which includes automated exposure control,  adjustment of the mA and/or kV according to patient size and/or use of iterative reconstruction technique. COMPARISON:  11/09/2021 FINDINGS: Brain: No evidence of acute infarction, hemorrhage, hydrocephalus, extra-axial collection or mass lesion/mass effect. Generalized brain atrophy and confluent chronic small vessel ischemia. Remote lacunar infarct at the posterior left detained min. Vascular: No hyperdense vessel or unexpected calcification. Skull: Normal. Negative for fracture or focal lesion. Sinuses/Orbits: Unremarkable. IMPRESSION: 1. No acute or reversible finding. 2. Brain atrophy and extensive chronic small vessel ischemia without interval change. Electronically Signed   By: Jorje Guild M.D.   On: 04/23/2022 12:09    amiodarone  200 mg Oral Daily   calcitRIOL  2.25 mcg Oral Q M,W,F-HD   Chlorhexidine Gluconate Cloth  6 each Topical Q0600   cinacalcet  30 mg Oral Q M,W,F-HD   darbepoetin (ARANESP) injection - DIALYSIS  200 mcg Intravenous Q Fri-HD   levothyroxine  25 mcg Oral Daily  metoprolol tartrate  12.5 mg Oral BID   midodrine  5-10 mg Oral 3 times per day on Mon Wed Fri   pantoprazole  40 mg Oral Daily   rosuvastatin  10 mg Oral Daily   sevelamer carbonate  2,400 mg Oral TID WC   sodium zirconium cyclosilicate  10 g Oral BID    BMET    Component Value Date/Time   NA 133 (L) 04/23/2022 2054   K 5.3 (H) 04/23/2022 2054   CL 93 (L) 04/23/2022 2054   CO2 22 04/23/2022 2054   GLUCOSE 90 04/23/2022 2054   BUN 74 (H) 04/23/2022 2054   CREATININE 9.84 (H) 04/23/2022 2054   CREATININE 6.57 (H) 03/05/2019 1159   CALCIUM 9.3 04/23/2022 2054   CALCIUM 8.1 (L) 05/29/2013 1814   GFRNONAA 4 (L) 04/23/2022 2054   GFRNONAA 6 (L) 03/05/2019 1159   GFRAA 9 (L) 02/12/2020 1712   GFRAA 6 (L) 03/05/2019 1159   CBC    Component Value Date/Time   WBC 8.6 04/23/2022 0951   RBC 2.81 (L) 04/23/2022 0951   HGB 9.7 (L) 04/23/2022 0951   HCT 29.5 (L) 04/23/2022 0951   PLT 280 04/23/2022  0951   MCV 105.0 (H) 04/23/2022 0951   MCH 34.5 (H) 04/23/2022 0951   MCHC 32.9 04/23/2022 0951   RDW 19.8 (H) 04/23/2022 0951   LYMPHSABS 0.8 04/21/2022 0636   MONOABS 0.5 04/21/2022 0636   EOSABS 0.4 04/21/2022 0636   BASOSABS 0.1 04/21/2022 0636   Outpatient orders: [facility], MWF.  3.5 hours.  300/500 flow rates.  2K/2.5 calcium.  EDW 59 kg.  Left upper extremity AVG, 15-gauge.  Meds: No heparin.  Mircera 200 mcg every 2 weeks (last dose 10/2), Venofer 50 mg q. weekly (last dose on 10/9), calcitriol 2.25 mcg 3 times weekly, Sensipar 30 mg 3 times weekly.  Assessment/Plan:  Myoclonus - Neuro consulted and MRI of brain and c-spine negative ESRD - normally MWF.  Had shortened treatment on Friday due to poorly functioning AVG which has been a chronic problem.  Still has thrill and bruit.  Will attempt HD again tomorrow since IR cannot perform shuntogram until Tuesday, however with hyperkalemia and AMS, she may require temp HD catheter tomorrow if not able to cannulate AVG. Hyperkalemia - will dose with lokelma 10 grams bid and follow. HTN/volume - stable.  Only 1.8 kg above edw. Vascular acces - as above Anemia of ESKD - continue with ESA and follow CKD-MBD - cont with binders vit d, and sensipar.   Donetta Potts, MD [facility]

## 2022-04-24 NOTE — Progress Notes (Addendum)
PROGRESS NOTE   Shawna [name]  QZR:[id number] DOB: Apr 19, 1941 DOA: 04/20/2022 PCP: [facility]  Brief Narrative:  81 year old African-American female CAD status post CABG with distal small vessel disease 2013 (recent [place] 07/2021 = all grafts patent), HFrEF + grade 2 diastolic HF PEF 35-45% 12/10/5636 recurrent GI bleed with chronic anemia precluding anticoagulation for Recurrent a flutter and?  Microangiopathic angina ESRD dialysis MWF History of colon and ovarian cancer seen by oncology in the past with normocytic anemia   Seen at [facility] hospital 4/9 with left ear pain chest pain shortness of breath-troponins bumped from 30-90 but was thought to be secondary to fluid overload not ACS and patient was discharged home as EKG was stable  Admitted 4/10 2/2 recurrent intermittent chest reminiscent of her angina- previously treated in the outpatient for Pseudomonas otitis by ENT  Admit 5/1 through 5/17 for toxic metabolic encephalopathy secondary to malignant otitis externa cefepime induced neurotoxicity side effect of gabapentin Xanax narcotics  Hide represented to [facility] ED with reports of low hemoglobin on 10/6 and counts were 7.8 and she was sent home  Again return to [facility] ED 10/11 with generalized weakness limiting mobility requiring increasing assistance--- on evaluation hemoglobin 6.6 FOBT negative, troponins 20s, bradycardic on monitors and was admitte for blood transfusion as she had increased antibodies  10/13 confusion noted-had received Atarax 100-this was decreased to 25 10/14 patient developed ataxia jerking upper extremities face + dysarthria--CT negative for stroke--sent over to [facility] for MRI brain TSH abnormal 4.7 T41.16, ammonia 30 Neurologist consulted   Hospital-Problem based course  Toxic metabolic encephalopathy with myoclonus MRI brain negative for stroke-MR neck no specific abnormality ?  Amiodarone versus Flexeril Xanax-Flexeril Xanax  discontinued-Ativan discontinued  May need to discontinue Atarax 25 completely Keppra 250 every 24 IV for myoclonus  ESRD MWF at [facility] in [place] usually 3-1/2 hours using left upper extremity AVG Truncated sessions on 10/13-secondary to poorly functioning left arm fistula which is to be intervened upon 10/17 May require temporary HD cath if cannot cannulate on 10/16 Continue calcitriol 2.2, Sensipar 30 MWF Continue midodrine 5 to 10 mg Monday Wednesday Friday  Mild hyperkalemia 5.3 Give Lokelma 10 twice daily-check labs again in a.m.  Anemia of renal disease FOBT -10/11, iron 58, saturations was 23 10/12 Patient was transfused 2 units PRBC 10/12 Repeat CBC in a.m. Continue iron as 200 q. as per renal  Permanent A-fib CHADVASC >3 not on anticoagulation secondary to bleed Has chronic left bundle additionally Continue metoprolol 12.5 twice daily, lower amio form 937-->342 qd Systolic and diastolic heart failure EF as above CAD with CABG in 2013 and all grafts patent on left heart cath 07/2021 Manage with dialysis and meds as above  ?  TSH mediated hyperthyroidism versus effect of amiodarone Get stat T3 which would help [place] differentiate the 2- didcut back dose of amnio   May need assistance of endocrinology but we do not have an endocrinologist available-May reach out to outpatient specialists 10/16 for assistance if they can provide  generalized weakness Requires skilled placement usually uses a cane at baseline-SNF when medically stabilized  Anxiety Continue Atarax low-dose 25   DVT prophylaxis: Heparin Code Status: Full Family Communication: None at bedside Disposition:  Status is: Inpatient Remains inpatient appropriate because:   Requires work-up of myoclonus and control of this Requires stability   Consultants:  Neurology Nephrology  Procedures: n  Antimicrobials: n    Subjective: Awakens easily but is quite sleepy did not sleep  overnight received Ativan for  cervical MR-not in normal pain-follows some commands but not completely oriented She raises both hands and does not have any jerking motions    Objective: Vitals:   04/23/22 1806 04/23/22 2247 04/24/22 0456 04/24/22 0953  BP: (!) 156/107 127/65 (!) 150/75 (!) 142/75  Pulse: (!) 57 61 65 66  Resp: '19 17 16 18  '$ Temp: 98.5 F (36.9 C) 98.4 F (36.9 C) 98.5 F (36.9 C) 98.6 F (37 C)  TempSrc: Oral   Oral  SpO2: 96% 100% 100% 98%  Weight:      Height:        Intake/Output Summary (Last 24 hours) at 04/24/2022 1037 Last data filed at 04/24/2022 0841 Gross per 24 hour  Intake 223 ml  Output 0 ml  Net 223 ml   Filed Weights   04/22/22 1440 04/23/22 0637 04/23/22 1800  Weight: 59.6 kg 59 kg 60.8 kg    Examination:    Data Reviewed: personally reviewed   CBC    Component Value Date/Time   WBC 8.6 04/23/2022 0951   RBC 2.81 (L) 04/23/2022 0951   HGB 9.7 (L) 04/23/2022 0951   HCT 29.5 (L) 04/23/2022 0951   PLT 280 04/23/2022 0951   MCV 105.0 (H) 04/23/2022 0951   MCH 34.5 (H) 04/23/2022 0951   MCHC 32.9 04/23/2022 0951   RDW 19.8 (H) 04/23/2022 0951   LYMPHSABS 0.8 04/21/2022 0636   MONOABS 0.5 04/21/2022 0636   EOSABS 0.4 04/21/2022 0636   BASOSABS 0.1 04/21/2022 0636      Latest Ref Rng & Units 04/23/2022    8:54 PM 04/23/2022    9:51 AM 04/22/2022   12:00 PM  CMP  Glucose 70 - 99 mg/dL 90  108  120   BUN 8 - 23 mg/dL 74  66  70   Creatinine 0.44 - 1.00 mg/dL 9.84  8.97  9.15   Sodium 135 - 145 mmol/L 133  132  132   Potassium 3.5 - 5.1 mmol/L 5.3  4.9  4.6   Chloride 98 - 111 mmol/L 93  94  95   CO2 22 - 32 mmol/L '22  23  22   '$ Calcium 8.9 - 10.3 mg/dL 9.3  9.1  8.8   Total Protein 6.5 - 8.1 g/dL 6.4     Total Bilirubin 0.3 - 1.2 mg/dL 1.0     Alkaline Phos 38 - 126 U/L 82     AST 15 - 41 U/L 15     ALT 0 - 44 U/L 13        Radiology Studies: MR CERVICAL SPINE WO CONTRAST  Result Date: 04/24/2022 CLINICAL DATA:  81 year old female with ataxia.  Combative. Confusion and weakness. Dialysis patient. EXAM: MRI CERVICAL SPINE WITHOUT CONTRAST TECHNIQUE: Multiplanar, multisequence MR imaging of the cervical spine was performed. No intravenous contrast was administered. COMPARISON:  Brain MRI 04/23/2022. Chest CT 07/16/2021. FINDINGS: Despite anxiolysis medication administration the examination had to be discontinued prior to completion due to patient combativeness. No axial imaging obtained, and sagittal imaging is motion degraded. Alignment: Maintained cervical lordosis. Vertebrae: No marrow edema or acute osseous abnormality is evident. Some cervical spine endplate degeneration, most apparent at C6-C7. Cord: Grossly normal but not well evaluated. The visible upper thoracic spinal canal is capacious. Posterior Fossa, vertebral arteries, paraspinal tissues: Cervicomedullary junction is within normal limits. Brain MRI detailed yesterday. Motion degraded but grossly negative visible neck soft tissues. Questionable abnormal signal in the posterior right  upper lobe lung apex visible on both T2 and STIR (series 5, image 1). But on comparison to the January chest CT this appears stable and related to chronic subpleural scarring. Disc levels: Cervical spine disc, endplate, and posterior element degeneration is apparent on this sagittal only exam, but with only mild if any cervical spinal stenosis suspected. IMPRESSION: 1. Truncated and motion degraded exam due to patient combativeness. 2. Cervical spine degeneration seems fairly appropriate for age. No advanced cervical spinal stenosis or definite cervical cord abnormality. And Capacious visible upper thoracic spinal canal. Electronically Signed   By: Genevie Ann M.D.   On: 04/24/2022 09:32   MR BRAIN WO CONTRAST  Result Date: 04/23/2022 CLINICAL DATA:  Ataxia EXAM: MRI HEAD WITHOUT CONTRAST TECHNIQUE: Multiplanar, multiecho pulse sequences of the brain and surrounding structures were obtained without intravenous  contrast. COMPARISON:  None Available. FINDINGS: Motion degraded examination. Brain: No acute infarct, mass effect or extra-axial collection. No acute or chronic hemorrhage. There is multifocal hyperintense T2-weighted signal within the white matter. Generalized volume loss. The midline structures are normal. Vascular: Major flow voids are preserved. Skull and upper cervical spine: Normal calvarium and skull base. Visualized upper cervical spine and soft tissues are normal. Sinuses/Orbits:No paranasal sinus fluid levels or advanced mucosal thickening. No mastoid or middle ear effusion. Normal orbits. IMPRESSION: 1. Motion degraded examination. 2. No acute intracranial abnormality. 3. Findings of chronic small vessel ischemia and volume loss. Electronically Signed   By: Ulyses Jarred M.D.   On: 04/23/2022 23:01   CT HEAD WO CONTRAST (5MM)  Result Date: 04/23/2022 CLINICAL DATA:  Ataxia.  Jerking movements in the left arm. EXAM: CT HEAD WITHOUT CONTRAST TECHNIQUE: Contiguous axial images were obtained from the base of the skull through the vertex without intravenous contrast. RADIATION DOSE REDUCTION: This exam was performed according to the departmental dose-optimization program which includes automated exposure control, adjustment of the mA and/or kV according to patient size and/or use of iterative reconstruction technique. COMPARISON:  11/09/2021 FINDINGS: Brain: No evidence of acute infarction, hemorrhage, hydrocephalus, extra-axial collection or mass lesion/mass effect. Generalized brain atrophy and confluent chronic small vessel ischemia. Remote lacunar infarct at the posterior left detained min. Vascular: No hyperdense vessel or unexpected calcification. Skull: Normal. Negative for fracture or focal lesion. Sinuses/Orbits: Unremarkable. IMPRESSION: 1. No acute or reversible finding. 2. Brain atrophy and extensive chronic small vessel ischemia without interval change. Electronically Signed   By: Jorje Guild M.D.   On: 04/23/2022 12:09     Scheduled Meds:  amiodarone  200 mg Oral Daily   calcitRIOL  2.25 mcg Oral Q M,W,F-HD   Chlorhexidine Gluconate Cloth  6 each Topical Q0600   cinacalcet  30 mg Oral Q M,W,F-HD   darbepoetin (ARANESP) injection - DIALYSIS  200 mcg Intravenous Q Fri-HD   levothyroxine  25 mcg Oral Daily   metoprolol tartrate  12.5 mg Oral BID   midodrine  5-10 mg Oral 3 times per day on Mon Wed Fri   pantoprazole  40 mg Oral Daily   rosuvastatin  10 mg Oral Daily   sevelamer carbonate  2,400 mg Oral TID WC   sodium zirconium cyclosilicate  10 g Oral BID   Continuous Infusions:  anticoagulant sodium citrate     levETIRAcetam 250 mg (04/24/22 0015)     LOS: 1 day   Time spent: C[place]le, MD [facility] To contact the attending provider between 7A-7P or the covering provider during after hours 7P-7A, please  log into the web site [email or web address] and access using universal [place] password for that web site. If you do not have the password, please call the hospital operator.  04/24/2022, 10:37 AM

## 2022-04-25 DIAGNOSIS — R410 Disorientation, unspecified: Secondary | ICD-10-CM | POA: Diagnosis not present

## 2022-04-25 DIAGNOSIS — R531 Weakness: Secondary | ICD-10-CM | POA: Diagnosis not present

## 2022-04-25 LAB — RENAL FUNCTION PANEL
Albumin: 2.8 g/dL — ABNORMAL LOW (ref 3.5–5.0)
Anion gap: 19 — ABNORMAL HIGH (ref 5–15)
BUN: 105 mg/dL — ABNORMAL HIGH (ref 8–23)
CO2: 22 mmol/L (ref 22–32)
Calcium: 9 mg/dL (ref 8.9–10.3)
Chloride: 94 mmol/L — ABNORMAL LOW (ref 98–111)
Creatinine, Ser: 12.47 mg/dL — ABNORMAL HIGH (ref 0.44–1.00)
GFR, Estimated: 3 mL/min — ABNORMAL LOW (ref >60)
Glucose, Bld: 102 mg/dL — ABNORMAL HIGH (ref 70–99)
Phosphorus: 7.7 mg/dL — ABNORMAL HIGH (ref 2.5–4.6)
Potassium: 5.1 mmol/L (ref 3.5–5.1)
Sodium: 135 mmol/L (ref 135–145)

## 2022-04-25 LAB — CBC
HCT: 28.1 % — ABNORMAL LOW (ref 36.0–46.0)
Hemoglobin: 9.6 g/dL — ABNORMAL LOW (ref 12.0–15.0)
MCH: 34.8 pg — ABNORMAL HIGH (ref 26.0–34.0)
MCHC: 34.2 g/dL (ref 30.0–36.0)
MCV: 101.8 fL — ABNORMAL HIGH (ref 80.0–100.0)
Platelets: 168 10*3/uL (ref 150–400)
RBC: 2.76 MIL/uL — ABNORMAL LOW (ref 3.87–5.11)
RDW: 18.1 % — ABNORMAL HIGH (ref 11.5–15.5)
WBC: 7.8 10*3/uL (ref 4.0–10.5)
nRBC: 0 % (ref 0.0–0.2)

## 2022-04-25 MED ORDER — SODIUM ZIRCONIUM CYCLOSILICATE 10 G PO PACK
10.0000 g | PACK | Freq: Three times a day (TID) | ORAL | Status: DC
  Administered 2022-04-25 – 2022-04-26 (×2): 10 g via ORAL
  Filled 2022-04-25 (×2): qty 1

## 2022-04-25 NOTE — Progress Notes (Signed)
PT Cancellation Note  Patient Details Name: Shawna [name] MRN: [id number] DOB: [birth year]   Cancelled Treatment:    Reason Eval/Treat Not Completed: Patient at procedure or test/unavailable  Patient in hemodialysis. Will attempt evaluation on her return.    [place]  Office [phone]  Rexanne Mano 04/25/2022, 9:26 AM

## 2022-04-25 NOTE — Progress Notes (Signed)
Physical Therapy Treatment Patient Details Name: Shawna [name] MRN: [id number] DOB: 1940-08-07 Today's Date: 04/25/2022   History of Present Illness Shawna [name] is a 81 y.o.female presented to ED with a chief complaint of generalized weakness and Hgb of 6.6. 10/13 developed confusion. 10/14 developed ataxia jerking upper extremities face + dysarthria--CT negative for stroke; MRI: negative. +myoclonus PHMx: congestive heart failure, anxiety, coronary artery disease, chronic anemia, ESRD on hemodialysis, GERD, hyperlipidemia, hypertension, iron deficiency anemia, atrial fibrillation paroxysmal. Patient is able to ambulate with either a cane or walker at baseline.    PT Comments    Patient pleasantly confused but able to follow commands (with some repetition required). Able to progress to standing at EOB with min assist, however did not progress to ambulation due to myoclonus in bil LEs with near knee buckling. Will need +2 for safety to ambulate.     Recommendations for follow up therapy are one component of a multi-disciplinary discharge planning process, led by the attending physician.  Recommendations may be updated based on patient status, additional functional criteria and insurance authorization.  Follow Up Recommendations  Skilled nursing-short term rehab (<3 hours/day) Can patient physically be transported by private vehicle: Yes   Assistance Recommended at Discharge Intermittent Supervision/Assistance  Patient can return home with the following A lot of help with walking and/or transfers;A lot of help with bathing/dressing/bathroom;Assistance with cooking/housework;Help with stairs or ramp for entrance   Equipment Recommendations  None recommended by PT    Recommendations for Other Services       Precautions / Restrictions Precautions Precautions: Fall Precaution Comments: bil LE myoclonic jerks Restrictions Weight Bearing Restrictions: No     Mobility  Bed  Mobility Overal bed mobility: Needs Assistance Bed Mobility: Supine to Sit, Sit to Supine     Supine to sit: Min assist, HOB elevated Sit to supine: Min guard   General bed mobility comments: repeated cues to continue motions required    Transfers Overall transfer level: Needs assistance Equipment used: Rolling walker (2 wheels) Transfers: Sit to/from Stand Sit to Stand: Min assist           General transfer comment: from EOB with cues for hand placement each time x 5 reps    Ambulation/Gait               General Gait Details: deferred due to myoclonic jerk of bil knees in standing at EOB; repeated sit to stands only, will need +2 for safety   Stairs             Wheelchair Mobility    Modified Rankin (Stroke Patients Only)       Balance Overall balance assessment: Needs assistance Sitting-balance support: No upper extremity supported, Feet unsupported Sitting balance-Leahy Scale: Fair     Standing balance support: During functional activity, Bilateral upper extremity supported Standing balance-Leahy Scale: Poor Standing balance comment: fair/poor using RW                            Cognition Arousal/Alertness: Awake/alert Behavior During Therapy: WFL for tasks assessed/performed Overall Cognitive Status: Impaired/Different from baseline Area of Impairment: Orientation, Following commands, Safety/judgement, Awareness, Problem solving, Attention, Memory                 Orientation Level: Place, Time, Situation Current Attention Level: Sustained Memory: Decreased short-term memory (could not recall location despite being told) Following Commands: Follows one step commands inconsistently Safety/Judgement: Decreased awareness of  safety, Decreased awareness of deficits Awareness: Intellectual Problem Solving: Slow processing, Decreased initiation, Requires verbal cues, Difficulty sequencing, Requires tactile cues           Exercises General Exercises - Lower Extremity Ankle Circles/Pumps: AROM, AAROM, Both, 10 reps Long Arc Quad: AROM, Both, 10 reps Heel Slides: AAROM, Strengthening, Both, 10 reps (assisted flexion, resisted extension) Hip Flexion/Marching: AROM, Both, 10 reps, Seated    General Comments        Pertinent Vitals/Pain Pain Assessment Pain Assessment: No/denies pain    Home Living Family/patient expects to be discharged to:: Private residence Living Arrangements: Spouse/significant other Available Help at Discharge: Family;Available 24 hours/day Type of Home: House Home Access: Stairs to enter Entrance Stairs-Rails: Right Entrance Stairs-Number of Steps: 2   Home Layout: One level Home Equipment: Rollator (4 wheels);Cane - single point Additional Comments: All information taken from PT note from 4 days ago--pt is not able to answer questions today due to confusion and perserveration on word "yes"    Prior Function            PT Goals (current goals can now be found in the care plan section) Acute Rehab PT Goals Patient Stated Goal: pt unable to state due to confusion Time For Goal Achievement: 05/05/22 Potential to Achieve Goals: Good Progress towards PT goals: Progressing toward goals    Frequency    Min 3X/week      PT Plan Current plan remains appropriate    Co-evaluation              AM-PAC PT "6 Clicks" Mobility   Outcome Measure  Help needed turning from your back to your side while in a flat bed without using bedrails?: A Little Help needed moving from lying on your back to sitting on the side of a flat bed without using bedrails?: A Little Help needed moving to and from a bed to a chair (including a wheelchair)?: A Little Help needed standing up from a chair using your arms (e.g., wheelchair or bedside chair)?: A Little Help needed to walk in hospital room?: Total Help needed climbing 3-5 steps with a railing? : Total 6 Click Score: 14    End  of Session Equipment Utilized During Treatment: Gait belt Activity Tolerance: Patient tolerated treatment well Patient left: with call bell/phone within reach;in bed;with bed alarm set Nurse Communication: Mobility status PT Visit Diagnosis: Unsteadiness on feet (R26.81);Other abnormalities of gait and mobility (R26.89);Muscle weakness (generalized) (M62.81)     Time: 9509-3267 PT Time Calculation (min) (ACUTE ONLY): 20 min  Charges:  $Therapeutic Exercise: 8-22 mins                      Arby Barrette, PT Acute Rehabilitation Services  Office [phone]    Rexanne Mano 04/25/2022, 4:19 PM

## 2022-04-25 NOTE — Progress Notes (Addendum)
Neurology Progress Note  Brief HPI: With a history of end-stage renal disease on dialysis, CHF, CAD with CABG, A-fib not on anticoagulation, hypertension, anxiety and depression presented originally with symptomatic anemia with hemoglobin of 6.6 and was transfused with packed red blood cells.  Patient's hemodialysis session on Friday was truncated due to difficulties with access.  Patient now presents with new onset of segmental myoclonus in the face and bilateral arms.    Subjective: Patient is drowsy, will awaken, however not cooperative with examiner. She will do parts of exam and others she will not do or she swats me away and tells me "to go back to sleep".  I observed very minimal myoclonus in her left face No family at the bedside   Exam: Vitals:   04/24/22 2040 04/25/22 0506  BP: (!) 154/78 (!) 158/71  Pulse: (!) 59 (!) 57  Resp: 17 16  Temp: 98.2 F (36.8 C) 98.2 F (36.8 C)  SpO2: 97% 100%   Gen: elderly female laying in bed, in NAD Resp: non-labored breathing, no acute distress  Neuro: Mental Status: Very drowsy and falls asleep during exam. Requires repeated stimulation to complete exam.  Not cooperative with examiner and started to become combative. Oriented to self and age.  Cranial Nerves: Pupils equal round reactive to light, extraocular movements intact, facial sensation symmetric to light touch, facial movements symmetric, hearing intact to voice, phonation is normal, and tongue is midline Motor: All extremities to command.  Good antigravity strength in upper extremities.  Poor cooperation with exam, unable to lift legs off bed, withdraws bilaterally equally and moves both legs spontaneously No myoclonic movements observed Sensory: Reacts equally to light touch in all extremities Coordination: unable to assess Gait: Deferred  Pertinent Labs:    Latest Ref Rng & Units 04/24/2022    1:11 PM 04/23/2022    9:51 AM 04/22/2022   12:00 PM  CBC  WBC 4.0 - 10.5 K/uL 8.0   8.6  7.3   Hemoglobin 12.0 - 15.0 g/dL 9.6  9.7  9.4   Hematocrit 36.0 - 46.0 % 28.4  29.5  27.9   Platelets 150 - 400 K/uL 172  280  243        Latest Ref Rng & Units 04/24/2022    1:11 PM 04/23/2022    8:54 PM 04/23/2022    9:51 AM  BMP  Glucose 70 - 99 mg/dL 85  90  108   BUN 8 - 23 mg/dL 88  74  66   Creatinine 0.44 - 1.00 mg/dL 11.05  9.84  8.97   Sodium 135 - 145 mmol/L 133  133  132   Potassium 3.5 - 5.1 mmol/L 5.6  5.3  4.9   Chloride 98 - 111 mmol/L 93  93  94   CO2 22 - 32 mmol/L '20  22  23   '$ Calcium 8.9 - 10.3 mg/dL 9.1  9.3  9.1      Imaging Reviewed:  MRI brain: No acute abnormality.  Small vessel disease and volume loss, motion degraded  MRI C-spine reviewed, no acute abnormality, though again motion degraded  Assessment: 81 year old patient with history of end-stage renal disease on dialysis, CHF, CAD with CABG, A-fib not on anticoagulation rate controlled with metoprolol and amiodarone, hypertension, anxiety, and depression presented initially with anemia and now has myoclonic movements of bilateral upper extremities and face.    Differential is broad, and may include electrolyte derangements, use of amiodarone, withdrawal from sedatives as patient's dose  of Atarax was recently reduced.  Cerebral hypoxia secondary to anemia may have also played a role.  Fortunately imaging reveals no acute structural abnormality within limits of motion, though she remains very sedated secondary to Ativan that she received for imaging   Recommendations: - nephrology following- HD today  -Management of comorbidities per primary team -Neurology will follow   August Albino DNP, [place] for schedule and pager information 04/25/2022 7:52 AM  I have seen the patient, I rounded in the afternoon and she reports that she has had no twitching at all today.  She continues to be sleepy, but appears to be improved compared to previous exams.  My  suspicion is that she will continue to improve.  Roland Rack, MD [facility] [phone]  If 7pm- 7am, please page neurology on call as listed in [place].

## 2022-04-25 NOTE — Progress Notes (Signed)
Patient ID: DOROTHIA PASSMORE, female   DOB: November 04, 1940, 81 y.o.   MRN: [id number]   S: seen on HD this AM-  no c/o's-  says her spasms are better but is soft spoken-  BP is high -  neurology has seen-  imaging mostly unremarkable-  weaning off keppra   O:BP (!) 201/159   Pulse 61   Temp 98 F (36.7 C) (Oral)   Resp 18   Ht '5\' 2"'$  (1.575 m)   Wt 60.4 kg   SpO2 96%   BMI 24.35 kg/m   Intake/Output Summary (Last 24 hours) at 04/25/2022 0925 Last data filed at 04/25/2022 0600 Gross per 24 hour  Intake 120 ml  Output 0 ml  Net 120 ml   Intake/Output: I/O last 3 completed shifts: In: 223 [P.O.:120; IV Piggyback:103] Out: 0   Intake/Output this shift:  No intake/output data recorded. Weight change:  ZJQ:BHALPFX in bed in NAD CVS: RRR Resp: CTA Abd: +BS, soft, NT/ND Ext: no edema, LUE AVG +T/B  Recent Labs  Lab 04/20/22 2027 04/21/22 0636 04/21/22 2200 04/22/22 1200 04/23/22 0951 04/23/22 2054 04/24/22 1311 04/25/22 0837  NA 134* 135 134* 132* 132* 133* 133* 135  K 3.7 4.3 4.8 4.6 4.9 5.3* 5.6* 5.1  CL 97* 98 97* 95* 94* 93* 93* 94*  CO2 '25 25 22 22 23 22 '$ 20* 22  GLUCOSE 110* 86 116* 120* 108* 90 85 102*  BUN 33* 39* 56* 70* 66* 74* 88* 105*  CREATININE 5.75* 6.49* 7.98* 9.15* 8.97* 9.84* 11.05* 12.47*  ALBUMIN 3.5 3.2* 3.3* 3.0* 3.2* 2.9* 3.0* 2.8*  CALCIUM 8.4* 8.2* 8.6* 8.8* 9.1 9.3 9.1 9.0  PHOS  --   --  5.6* 5.8* 5.3*  --  7.1* 7.7*  AST 14* 16  --   --   --  15  --   --   ALT 14 12  --   --   --  13  --   --    Liver Function Tests: Recent Labs  Lab 04/20/22 2027 04/21/22 0636 04/21/22 2200 04/23/22 2054 04/24/22 1311 04/25/22 0837  AST 14* 16  --  15  --   --   ALT 14 12  --  13  --   --   ALKPHOS 98 90  --  82  --   --   BILITOT 0.5 0.7  --  1.0  --   --   PROT 7.3 6.8  --  6.4*  --   --   ALBUMIN 3.5 3.2*   < > 2.9* 3.0* 2.8*   < > = values in this interval not displayed.   No results for input(s): "LIPASE", "AMYLASE" in the last 168  hours. Recent Labs  Lab 04/23/22 1415  AMMONIA 30   CBC: Recent Labs  Lab 04/20/22 2335 04/21/22 0636 04/21/22 2200 04/22/22 1200 04/23/22 0951 04/24/22 1311 04/25/22 0837  WBC 7.6 5.8 7.6 7.3 8.6 8.0 7.8  NEUTROABS 5.4 4.0  --   --   --   --   --   HGB 6.7* 6.7* 10.2* 9.4* 9.7* 9.6* 9.6*  HCT 22.0* 21.4* 30.5* 27.9* 29.5* 28.4* 28.1*  MCV 122.9* 121.6* 102.0* 102.6* 105.0* 101.4* 101.8*  PLT 243 237 177 243 280 172 168   Cardiac Enzymes: No results for input(s): "CKTOTAL", "CKMB", "CKMBINDEX", "TROPONINI" in the last 168 hours. CBG: Recent Labs  Lab 04/20/22 1826  GLUCAP 95    Iron Studies: No results for input(s): "IRON", "  TIBC", "TRANSFERRIN", "FERRITIN" in the last 72 hours. Studies/Results: MR CERVICAL SPINE WO CONTRAST  Result Date: 04/24/2022 CLINICAL DATA:  81 year old female with ataxia. Combative. Confusion and weakness. Dialysis patient. EXAM: MRI CERVICAL SPINE WITHOUT CONTRAST TECHNIQUE: Multiplanar, multisequence MR imaging of the cervical spine was performed. No intravenous contrast was administered. COMPARISON:  Brain MRI 04/23/2022. Chest CT 07/16/2021. FINDINGS: Despite anxiolysis medication administration the examination had to be discontinued prior to completion due to patient combativeness. No axial imaging obtained, and sagittal imaging is motion degraded. Alignment: Maintained cervical lordosis. Vertebrae: No marrow edema or acute osseous abnormality is evident. Some cervical spine endplate degeneration, most apparent at C6-C7. Cord: Grossly normal but not well evaluated. The visible upper thoracic spinal canal is capacious. Posterior Fossa, vertebral arteries, paraspinal tissues: Cervicomedullary junction is within normal limits. Brain MRI detailed yesterday. Motion degraded but grossly negative visible neck soft tissues. Questionable abnormal signal in the posterior right upper lobe lung apex visible on both T2 and STIR (series 5, image 1). But on  comparison to the January chest CT this appears stable and related to chronic subpleural scarring. Disc levels: Cervical spine disc, endplate, and posterior element degeneration is apparent on this sagittal only exam, but with only mild if any cervical spinal stenosis suspected. IMPRESSION: 1. Truncated and motion degraded exam due to patient combativeness. 2. Cervical spine degeneration seems fairly appropriate for age. No advanced cervical spinal stenosis or definite cervical cord abnormality. And Capacious visible upper thoracic spinal canal. Electronically Signed   By: Genevie Ann M.D.   On: 04/24/2022 09:32   MR BRAIN WO CONTRAST  Result Date: 04/23/2022 CLINICAL DATA:  Ataxia EXAM: MRI HEAD WITHOUT CONTRAST TECHNIQUE: Multiplanar, multiecho pulse sequences of the brain and surrounding structures were obtained without intravenous contrast. COMPARISON:  None Available. FINDINGS: Motion degraded examination. Brain: No acute infarct, mass effect or extra-axial collection. No acute or chronic hemorrhage. There is multifocal hyperintense T2-weighted signal within the white matter. Generalized volume loss. The midline structures are normal. Vascular: Major flow voids are preserved. Skull and upper cervical spine: Normal calvarium and skull base. Visualized upper cervical spine and soft tissues are normal. Sinuses/Orbits:No paranasal sinus fluid levels or advanced mucosal thickening. No mastoid or middle ear effusion. Normal orbits. IMPRESSION: 1. Motion degraded examination. 2. No acute intracranial abnormality. 3. Findings of chronic small vessel ischemia and volume loss. Electronically Signed   By: Ulyses Jarred M.D.   On: 04/23/2022 23:01   CT HEAD WO CONTRAST (5MM)  Result Date: 04/23/2022 CLINICAL DATA:  Ataxia.  Jerking movements in the left arm. EXAM: CT HEAD WITHOUT CONTRAST TECHNIQUE: Contiguous axial images were obtained from the base of the skull through the vertex without intravenous contrast.  RADIATION DOSE REDUCTION: This exam was performed according to the departmental dose-optimization program which includes automated exposure control, adjustment of the mA and/or kV according to patient size and/or use of iterative reconstruction technique. COMPARISON:  11/09/2021 FINDINGS: Brain: No evidence of acute infarction, hemorrhage, hydrocephalus, extra-axial collection or mass lesion/mass effect. Generalized brain atrophy and confluent chronic small vessel ischemia. Remote lacunar infarct at the posterior left detained min. Vascular: No hyperdense vessel or unexpected calcification. Skull: Normal. Negative for fracture or focal lesion. Sinuses/Orbits: Unremarkable. IMPRESSION: 1. No acute or reversible finding. 2. Brain atrophy and extensive chronic small vessel ischemia without interval change. Electronically Signed   By: Jorje Guild M.D.   On: 04/23/2022 12:09    amiodarone  100 mg Oral Daily   calcitRIOL  2.25 mcg Oral Q M,W,F-HD   Chlorhexidine Gluconate Cloth  6 each Topical Q0600   cinacalcet  30 mg Oral Q M,W,F-HD   darbepoetin (ARANESP) injection - DIALYSIS  200 mcg Intravenous Q Fri-HD   levothyroxine  25 mcg Oral Daily   metoprolol tartrate  12.5 mg Oral BID   midodrine  5-10 mg Oral 3 times per day on Mon Wed Fri   pantoprazole  40 mg Oral Daily   rosuvastatin  10 mg Oral Daily   sevelamer carbonate  2,400 mg Oral TID WC   sodium zirconium cyclosilicate  10 g Oral TID    BMET    Component Value Date/Time   NA 135 04/25/2022 0837   K 5.1 04/25/2022 0837   CL 94 (L) 04/25/2022 0837   CO2 22 04/25/2022 0837   GLUCOSE 102 (H) 04/25/2022 0837   BUN 105 (H) 04/25/2022 0837   CREATININE 12.47 (H) 04/25/2022 0837   CREATININE 6.57 (H) 03/05/2019 1159   CALCIUM 9.0 04/25/2022 0837   CALCIUM 8.1 (L) 05/29/2013 1814   GFRNONAA 3 (L) 04/25/2022 0837   GFRNONAA 6 (L) 03/05/2019 1159   GFRAA 9 (L) 02/12/2020 1712   GFRAA 6 (L) 03/05/2019 1159   CBC    Component Value  Date/Time   WBC 7.8 04/25/2022 0837   RBC 2.76 (L) 04/25/2022 0837   HGB 9.6 (L) 04/25/2022 0837   HCT 28.1 (L) 04/25/2022 0837   PLT 168 04/25/2022 0837   MCV 101.8 (H) 04/25/2022 0837   MCH 34.8 (H) 04/25/2022 0837   MCHC 34.2 04/25/2022 0837   RDW 18.1 (H) 04/25/2022 0837   LYMPHSABS 0.8 04/21/2022 0636   MONOABS 0.5 04/21/2022 0636   EOSABS 0.4 04/21/2022 0636   BASOSABS 0.1 04/21/2022 0636   Outpatient orders: [facility], MWF.  3.5 hours.  300/500 flow rates.  2K/2.5 calcium.  EDW 59 kg.  Left upper extremity AVG, 15-gauge.  Meds: No heparin.  Mircera 200 mcg every 2 weeks (last dose 10/2), Venofer 50 mg q. weekly (last dose on 10/9), calcitriol 2.25 mcg 3 times weekly, Sensipar 30 mg 3 times weekly.  Assessment/Plan:  Myoclonus - Neuro consulted and MRI of brain and c-spine negative-  weaning off of keppra-  thinking med or metabolic impact  ESRD - normally MWF.  Had shortened treatment on Friday due to poorly functioning AVG which has been a chronic problem.  Still has thrill and bruit. Attempting HD again today-  350- BFR so far-   IR planning shuntogram Tuesday,  hyperkalemia and AMS with BUN over 100 concerning for access malfunction  Hyperkalemia - will dose with lokelma 10 grams bid and follow.  5.1 pre HD today  HTN/volume - stable.  Only 1.8 kg above edw.  But BP is high ?  Normally on lopressor and midodrine-  will stop the midodrine-  HR is 62 so will not uptitrate the lopressor-  may need another agent ?  Vascular acces - as above Anemia of ESKD - continue with ESA and follow-  200 ordered weekly  CKD-MBD - cont with binders 9 renvela) vit d ( rocaltrol) , and sensipar.   Louis Meckel  [employer]

## 2022-04-25 NOTE — Progress Notes (Signed)
Received patient in bed to unit.  Alert and oriented.  Informed consent signed and in chart.   Treatment initiated: 820 Treatment completed: 1210  Patient tolerated well.  Transported back to the room  Alert, without acute distress.  Hand-off given to patient's nurse.   Access used: Yes Access issues: No  Total UF removed: 2000 Medication(s) given: No Post HD VS: 98.2, 60, 18,152/62 Post HD weight: 58.2kg   [facility]

## 2022-04-25 NOTE — Progress Notes (Signed)
OT Cancellation Note  Patient Details Name: Shawna [name] MRN: [id number] DOB: [birth year]   Cancelled Treatment:    Reason Eval/Treat Not Completed: Patient at procedure or test/ unavailable. Pt just transferred to hemodialysis. Will check back at later time as schedule allows for evaluation.  [place], OTR/L Acute Rehab Services [facility] [phone] Office [phone]    Almon Register 04/25/2022, 8:09 AM

## 2022-04-25 NOTE — Consult Note (Signed)
Chief Complaint: Patient was seen in consultation today for end stage renal disease  Referring Physician(s): Dr. Marval Regal  Supervising Physician: Jacqulynn Cadet  Patient Status: [facility] - In-pt  History of Present Illness: Shawna [name] is a 81 y.o. female with history of end stage renal disease admitted to [facility] with anemia and critical Hgb of 6.8. She has been undergoing regular HD during admission and IR was consulted for difficultly with cannulation of LUE AVG- machine giving high venous pressure alarms and she was unable to do full sessions of dialysis. She has a left upper arm graft placed in 2018. Last revised with interposition jump graft using 6 mm Gore limb Dec 2021. IR consulted for declotting procedure. This was planned to occur as a round trip procedure at [facility] with patient expected to return to [facility] post-procedure, however she developed myoclonic jerks and was transferred to [facility] for ongoing evaluation.    Past Medical History:  Diagnosis Date   Acute on chronic respiratory failure with hypoxia ([place]) 10/10/2016   Anxiety    Arthritis    AVM (arteriovenous malformation) of colon    CAD (coronary artery disease)    a. s/p CABG in 2013 b. DES to D1 in 10/2016. c. cath in 07/2018 showing patent grafts with occlusion of D1 at prior stent site and progression of PDA disease --> medical management recommended   Carotid artery disease ([place])    a. 35-32% LICA, 03/9241    Chronic anemia    Chronic bronchitis (HCC)    Chronic diastolic CHF (congestive heart failure) ([place])    a. 02/2012 Echo EF 60-65%, nl wall motion, Gr 1 DD, mod MR   Colon cancer ([place]) 1992   Esophageal stricture    ESRD on hemodialysis ([place])    ESRD due to HTN, started dialysis 2011 and gets HD at [facility] with Dr Hinda Lenis on MWF schedule.  Access is LUA AVF as of Sept 2014.    GERD (gastroesophageal reflux disease)    High cholesterol 12/2011   History of blood transfusion 07/2011; 12/2011; 01/2012 X  2; 04/2012   History of gout    History of lower GI bleeding    Hypertension    Iron deficiency anemia    Jugular vein occlusion, right (HCC)    Mitral regurgitation    a. Moderate by echo, 02/2012   Myocardial infarction [facility])    NSVT (nonsustained ventricular tachycardia) ([place])    Ovarian cancer ([place]) 1992   PAF (paroxysmal atrial fibrillation) ([place])    Pneumonia ~ 2009   PUD (peptic ulcer disease)    TIA (transient ischemic attack)     Past Surgical History:  Procedure Laterality Date   A/V SHUNTOGRAM Left 03/19/2019   Procedure: A/V SHUNTOGRAM;  Surgeon: Katha Cabal, MD;  Location: [place] CV LAB;  Service: Cardiovascular;  Laterality: Left;   ABDOMINAL HYSTERECTOMY  1992   APPENDECTOMY  06/1990   AV FISTULA PLACEMENT  07/2009   left upper arm   AV FISTULA PLACEMENT Right 09/06/2016   Procedure: RIGHT FOREARM ARTERIOVENOUS (AV) GRAFT;  Surgeon: Elam Dutch, MD;  Location: [place];  Service: Vascular;  Laterality: Right;   AV FISTULA PLACEMENT N/A 02/24/2017   Procedure: INSERTION OF ARTERIOVENOUS (AV) GORE-TEX GRAFT ARM (BRACHIAL AXILLARY);  Surgeon: Katha Cabal, MD;  Location: [facility] ORS;  Service: Vascular;  Laterality: N/A;   [place] Right 09/06/2016   Procedure: REMOVAL OF Right Arm ARTERIOVENOUS GORETEX GRAFT and Vein Patch angioplasty  of brachial artery;  Surgeon: Angelia Mould, MD;  Location: [place];  Service: Vascular;  Laterality: Right;   BIOPSY  09/26/2019   Procedure: BIOPSY;  Surgeon: Rogene Houston, MD;  [facility];  Service: Endoscopy;;   COLON RESECTION  1992   COLON SURGERY     COLONOSCOPY N/A 03/09/2019   Procedure: COLONOSCOPY;  Surgeon: Rogene Houston, MD;  [facility];  Service: Endoscopy;  Laterality: N/A;   COLONOSCOPY WITH PROPOFOL N/A 06/08/2021   Procedure: COLONOSCOPY WITH PROPOFOL;  Surgeon: Harvel Quale, MD;  [facility];  Service: Gastroenterology;  Laterality: N/A;   9:05 /Patient is on dialysis Mon Wed Fri   CORONARY ANGIOPLASTY WITH STENT PLACEMENT  12/15/11   "2"   CORONARY ANGIOPLASTY WITH STENT PLACEMENT  y/2013   "1; makes total of 3" (05/02/2012)   CORONARY ARTERY BYPASS GRAFT  06/13/2012   Procedure: CORONARY ARTERY BYPASS GRAFTING (CABG);  Surgeon: Grace Isaac, MD;  Location: [place];  Service: Open Heart Surgery;  Laterality: N/A;  cabg x four;  using left internal mammary artery, and left leg greater saphenous vein harvested endoscopically   CORONARY STENT INTERVENTION N/A 10/13/2016   Procedure: Coronary Stent Intervention;  Surgeon: Troy Sine, MD;  Location: [place] CV LAB;  Service: Cardiovascular;  Laterality: N/A;   DIALYSIS/PERMA CATHETER REMOVAL N/A 04/18/2017   Procedure: DIALYSIS/PERMA CATHETER REMOVAL;  Surgeon: Katha Cabal, MD;  Location: [place] CV LAB;  Service: Cardiovascular;  Laterality: N/A;   DILATION AND CURETTAGE OF UTERUS     ENTEROSCOPY N/A 06/08/2021   Procedure: PUSH ENTEROSCOPY;  Surgeon: Harvel Quale, MD;  [facility];  Service: Gastroenterology;  Laterality: N/A;   ESOPHAGOGASTRODUODENOSCOPY  01/20/2012   Procedure: ESOPHAGOGASTRODUODENOSCOPY (EGD);  Surgeon: Ladene Artist, MD,FACG;  Location: [facility] ENDOSCOPY;  Service: Endoscopy;  Laterality: N/A;   ESOPHAGOGASTRODUODENOSCOPY N/A 03/26/2013   Procedure: ESOPHAGOGASTRODUODENOSCOPY (EGD);  Surgeon: Irene Shipper, MD;  Location: [facility] ENDOSCOPY;  Service: Endoscopy;  Laterality: N/A;   ESOPHAGOGASTRODUODENOSCOPY N/A 04/30/2015   Procedure: ESOPHAGOGASTRODUODENOSCOPY (EGD);  Surgeon: Rogene Houston, MD;  [facility];  Service: Endoscopy;  Laterality: N/A;  1pm - moved to 10/20 @ 1:10   ESOPHAGOGASTRODUODENOSCOPY N/A 07/29/2016   Procedure: ESOPHAGOGASTRODUODENOSCOPY (EGD);  Surgeon: Manus Gunning, MD;  Location: [place];  Service: Gastroenterology;  Laterality: N/A;  enteroscopy   ESOPHAGOGASTRODUODENOSCOPY  N/A 09/26/2019   Procedure: ESOPHAGOGASTRODUODENOSCOPY (EGD);  Surgeon: Rogene Houston, MD;  [facility];  Service: Endoscopy;  Laterality: N/A;  1250   ESOPHAGOGASTRODUODENOSCOPY (EGD) WITH PROPOFOL N/A 02/05/2021   Procedure: ESOPHAGOGASTRODUODENOSCOPY (EGD) WITH PROPOFOL;  Surgeon: Eloise Harman, DO;  [facility];  Service: Endoscopy;  Laterality: N/A;   GIVENS CAPSULE STUDY N/A 03/07/2019   Procedure: GIVENS CAPSULE STUDY;  Surgeon: Rogene Houston, MD;  [facility];  Service: Endoscopy;  Laterality: N/A;  7:30   GIVENS CAPSULE STUDY N/A 04/22/2021   Procedure: GIVENS CAPSULE STUDY;  Surgeon: Rogene Houston, MD;  [facility];  Service: Endoscopy;  Laterality: N/A;  7:30   INSERTION OF DIALYSIS CATHETER N/A 10/05/2020   Procedure: ABORTED TUNNELED DIALYSIS CATHETER PLACEMENT RIGHT INTERNAL JUGULAR VEIN ;  Surgeon: Virl Cagey, MD;  Location: [facility];  Service: General;  Laterality: N/A;   INTRAOPERATIVE TRANSESOPHAGEAL ECHOCARDIOGRAM  06/13/2012   Procedure: INTRAOPERATIVE TRANSESOPHAGEAL ECHOCARDIOGRAM;  Surgeon: Grace Isaac, MD;  Location: [place];  Service: Open Heart Surgery;  Laterality: N/A;   IR DIALY SHUNT INTRO NEEDLE/INTRACATH INITIAL W/IMG LEFT Left 10/06/2020   IR FLUORO GUIDE CV LINE RIGHT  06/17/2020   IR GENERIC HISTORICAL  07/26/2016   IR FLUORO GUIDE CV LINE RIGHT 07/26/2016 Greggory Keen, MD [facility]   IR GENERIC HISTORICAL  07/26/2016   IR US GUIDE VASC ACCESS RIGHT 07/26/2016 Greggory Keen, MD [facility]   IR GENERIC HISTORICAL  08/02/2016   IR US GUIDE VASC ACCESS RIGHT 08/02/2016 Greggory Keen, MD [facility]   IR GENERIC HISTORICAL  08/02/2016   IR FLUORO GUIDE CV LINE RIGHT 08/02/2016 Greggory Keen, MD [facility]   IR RADIOLOGY PERIPHERAL GUIDED IV START  03/28/2017   IR REMOVAL TUN CV CATH W/O FL  08/11/2020   IR THROMBECTOMY AV FISTULA W/THROMBOLYSIS INC/SHUNT/IMG LEFT Left 06/17/2020   IR US GUIDE VASC  ACCESS LEFT  06/17/2020   IR US GUIDE VASC ACCESS RIGHT  03/28/2017   IR US GUIDE VASC ACCESS RIGHT  06/17/2020   LEFT HEART CATH AND CORONARY ANGIOGRAPHY N/A 09/20/2016   Procedure: Left Heart Cath and Coronary Angiography;  Surgeon: [name], MD;  Location: [place] CV LAB;  Service: Cardiovascular;  Laterality: N/A;   LEFT HEART CATH AND CORS/GRAFTS ANGIOGRAPHY N/A 10/13/2016   Procedure: Left Heart Cath and Cors/Grafts Angiography;  Surgeon: Troy Sine, MD;  Location: [place] CV LAB;  Service: Cardiovascular;  Laterality: N/A;   LEFT HEART CATH AND CORS/GRAFTS ANGIOGRAPHY N/A 07/13/2018   Procedure: LEFT HEART CATH AND CORS/GRAFTS ANGIOGRAPHY;  Surgeon: Martinique, Peter M, MD;  Location: [place] CV LAB;  Service: Cardiovascular;  Laterality: N/A;   LEFT HEART CATH AND CORS/GRAFTS ANGIOGRAPHY N/A 07/22/2021   Procedure: LEFT HEART CATH AND CORS/GRAFTS ANGIOGRAPHY;  Surgeon: Lorretta Harp, MD;  Location: [place] CV LAB;  Service: Cardiovascular;  Laterality: N/A;   LEFT HEART CATHETERIZATION WITH CORONARY ANGIOGRAM N/A 12/15/2011   Procedure: LEFT HEART CATHETERIZATION WITH CORONARY ANGIOGRAM;  Surgeon: Burnell Blanks, MD;  Location: [facility] CATH LAB;  Service: Cardiovascular;  Laterality: N/A;   LEFT HEART CATHETERIZATION WITH CORONARY ANGIOGRAM N/A 01/10/2012   Procedure: LEFT HEART CATHETERIZATION WITH CORONARY ANGIOGRAM;  Surgeon: Peter M Martinique, MD;  Location: [facility] CATH LAB;  Service: Cardiovascular;  Laterality: N/A;   LEFT HEART CATHETERIZATION WITH CORONARY ANGIOGRAM N/A 06/08/2012   Procedure: LEFT HEART CATHETERIZATION WITH CORONARY ANGIOGRAM;  Surgeon: Burnell Blanks, MD;  Location: [facility] CATH LAB;  Service: Cardiovascular;  Laterality: N/A;   LEFT HEART CATHETERIZATION WITH CORONARY/GRAFT ANGIOGRAM N/A 12/10/2013   Procedure: LEFT HEART CATHETERIZATION WITH Beatrix Fetters;  Surgeon: [name], MD;  Location: [facility] CATH LAB;  Service: Cardiovascular;   Laterality: N/A;   OVARY SURGERY     ovarian cancer   POLYPECTOMY  03/09/2019   Procedure: POLYPECTOMY;  Surgeon: Rogene Houston, MD;  [facility];  Service: Endoscopy;;  cecal    POLYPECTOMY N/A 09/26/2019   Procedure: DUODENAL POLYPECTOMY;  Surgeon: Rogene Houston, MD;  [facility];  Service: Endoscopy;  Laterality: N/A;   REVISION OF ARTERIOVENOUS GORETEX GRAFT N/A 02/24/2017   Procedure: REVISION OF ARTERIOVENOUS GORETEX GRAFT (RESECTION);  Surgeon: Katha Cabal, MD;  Location: [facility] ORS;  Service: Vascular;  Laterality: N/A;   REVISON OF ARTERIOVENOUS FISTULA Left 06/19/2020   Procedure: REVISION OF LEFT UPPER ARM AV GRAFT WITH INTERPOSITION JUMP GRAFT USING 6MM GORE LIMB;  Surgeon: Marty Heck, MD;  [facility];  Service: Vascular;  Laterality: Left;   SHUNTOGRAM N/A 10/15/2013   Procedure: Fistulogram;  Surgeon: Serafina Mitchell, MD;  Location: [facility] CATH LAB;  Service: Cardiovascular;  Laterality: N/A;   THROMBECTOMY / ARTERIOVENOUS GRAFT REVISION  2011   left upper arm   TUBAL LIGATION  1980's   UPPER EXTREMITY ANGIOGRAPHY Bilateral 12/06/2016   Procedure: Upper Extremity Angiography;  Surgeon: Katha Cabal, MD;  Location: [place] CV LAB;  Service: Cardiovascular;  Laterality: Bilateral;   UPPER EXTREMITY INTERVENTION Left 06/06/2017   Procedure: UPPER EXTREMITY INTERVENTION;  Surgeon: Katha Cabal, MD;  Location: [place] CV LAB;  Service: Cardiovascular;  Laterality: Left;    Allergies: Amlodipine, Aspirin, Nitrofurantoin, Bactrim [sulfamethoxazole-trimethoprim], Contrast media [iodinated contrast media], Iron, Tylenol [acetaminophen], Gabapentin, Iron sucrose, Ranexa [ranolazine], Sucroferric oxyhydroxide, Dexilant [dexlansoprazole], Hydralazine, Levaquin [levofloxacin in d5w], Morphine and related, Pantoprazole, Plavix [clopidogrel bisulfate], Protonix [pantoprazole sodium], and Venofer [ferric  oxide]  Medications: Prior to Admission medications   Medication Sig Start Date End Date Taking? Authorizing Provider  ALPRAZolam Duanne Moron) 0.5 MG tablet Take 0.5 mg by mouth daily as needed. 04/12/22  Yes [provider]  amiodarone (PACERONE) 100 MG tablet Take 2 tablets (200 mg total) by mouth daily. 11/23/21  Yes Ghimire, Henreitta Leber, MD  Aspirin 81 MG CAPS Take 1 capsule by mouth daily at 12 noon.   Yes [provider]  cyclobenzaprine (FLEXERIL) 5 MG tablet Take 5 mg by mouth 3 (three) times daily as needed. 12/27/21  Yes [provider]  hydrOXYzine (VISTARIL) 100 MG capsule Take 100 mg by mouth at bedtime. 12/13/21  Yes [provider]  levothyroxine (SYNTHROID) 25 MCG tablet Take 1 tablet (25 mcg total) by mouth daily. 02/02/21  Yes Brita Romp, NP  metoprolol tartrate (LOPRESSOR) 25 MG tablet Take 0.5 tablets (12.5 mg total) by mouth 2 (two) times daily. 03/04/22  Yes Branch, Alphonse Guild, MD  midodrine (PROAMATINE) 5 MG tablet Take 5-10 mg by mouth See admin instructions. 3 times a week on dialysis days 12/10/21  Yes [provider]  multivitamin (RENA-VIT) TABS tablet Take 1 tablet by mouth daily.   Yes [provider]  nitroGLYCERIN (NITROSTAT) 0.4 MG SL tablet Place 0.4 mg under the tongue every 5 (five) minutes as needed for chest pain.   Yes [provider]  omeprazole (PRILOSEC) 20 MG capsule Take 1 capsule (20 mg total) by mouth daily. Patient taking differently: Take 40 mg by mouth daily as needed (acid reflux). 12/01/20  Yes Rehman, Mechele Dawley, MD  rosuvastatin (CRESTOR) 10 MG tablet Take 1 tablet (10 mg total) by mouth daily. 05/13/21  Yes Strader, [place], PA-C  sevelamer carbonate (RENVELA) 800 MG tablet Take 2,400 mg by mouth 3 (three) times daily. 12/27/21  Yes [provider]     Family History  Problem Relation Age of Onset   Heart disease Mother        Heart Disease before age 61   Hyperlipidemia Mother     Hypertension Mother    Diabetes Mother    Heart attack Mother    Heart disease Father        Heart Disease before age 31   Hyperlipidemia Father    Hypertension Father    Diabetes Father    Diabetes Sister    Hypertension Sister    Diabetes Brother    Hyperlipidemia Brother    Heart attack Brother    Hypertension Sister    Heart attack  Brother    Colon cancer Child 49   Other Other        noncontributory for early CAD   Esophageal cancer Neg Hx    Liver disease Neg Hx    Kidney disease Neg Hx    Colon polyps Neg Hx     Social History   Socioeconomic History   Marital status: Married    Spouse name: Maxine Fredman   Number of children: 1   Years of education: Not on file   Highest education level: GED or equivalent  Occupational History   Occupation: retired- Technical sales engineer- [employer]  Tobacco Use   Smoking status: Never   Smokeless tobacco: Never  Vaping Use   Vaping Use: Never used  Substance and Sexual Activity   Alcohol use: No    Alcohol/week: 0.0 standard drinks of alcohol   Drug use: No   Sexual activity: Not Currently    Birth control/protection: Surgical  Other Topics Concern   Not on file  Social History Narrative   Lives in [place], [place] with husband.  Dialysis pt - [facility].   Social Determinants of Health   Financial Resource Strain: Low Risk  (12/14/2018)   Overall Financial Resource Strain (CARDIA)    Difficulty of Paying Living Expenses: Not very hard  Food Insecurity: No Food Insecurity (04/21/2022)   Hunger Vital Sign    Worried About Running Out of Food in the Last Year: Never true    Ran Out of Food in the Last Year: Never true  Transportation Needs: No Transportation Needs (04/21/2022)   PRAPARE - Hydrologist (Medical): No    Lack of Transportation (Non-Medical): No  Physical Activity: Unknown (12/14/2018)   Exercise Vital Sign    Days of Exercise per Week: Patient refused    Minutes of Exercise per  Session: Patient refused  Stress: No Stress Concern Present (12/14/2018)   [place]    Feeling of Stress : Only a little  Social Connections: Unknown (12/14/2018)   Social Connection and Isolation Panel [NHANES]    Frequency of Communication with Friends and Family: Patient refused    Frequency of Social Gatherings with Friends and Family: Patient refused    Attends Religious Services: Patient refused    Marine scientist or Organizations: Patient refused    Attends Archivist Meetings: Patient refused    Marital Status: Patient refused     Review of Systems: A 12 point ROS discussed and pertinent positives are indicated in the HPI above.  All other systems are negative.  Review of Systems  Constitutional:  Negative for fatigue and fever.  Respiratory:  Negative for cough and shortness of breath.   Cardiovascular:  Negative for chest pain.  Gastrointestinal:  Negative for abdominal pain.  Psychiatric/Behavioral:  Negative for behavioral problems and confusion.     Vital Signs: BP (!) 161/81   Pulse 68   Temp 98.1 F (36.7 C) (Oral)   Resp 18   Ht '5\' 2"'$  (1.575 m)   Wt 128 lb 4.9 oz (58.2 kg)   SpO2 99%   BMI 23.47 kg/m   Physical Exam Vitals and nursing note reviewed.  Constitutional:      General: She is not in acute distress.    Appearance: Normal appearance. She is normal weight.  HENT:     Mouth/Throat:     Mouth: Mucous membranes are moist.     Pharynx:  Oropharynx is clear.  Cardiovascular:     Rate and Rhythm: Normal rate and regular rhythm.  Pulmonary:     Effort: Pulmonary effort is normal.     Breath sounds: Normal breath sounds.  Skin:    General: Skin is warm and dry.     Comments: LUE AVG in place. Bandaged from access today.  Positive bruit and thrill. Non-tender, no bruising.   Neurological:     General: No focal deficit present.     Mental Status: She is alert. Mental  status is at baseline.      MD Evaluation Airway: WNL Heart: WNL Abdomen: WNL Chest/ Lungs: WNL ASA  Classification: 3 Mallampati/Airway Score: Two   Imaging: MR CERVICAL SPINE WO CONTRAST  Result Date: 04/24/2022 CLINICAL DATA:  81 year old female with ataxia. Combative. Confusion and weakness. Dialysis patient. EXAM: MRI CERVICAL SPINE WITHOUT CONTRAST TECHNIQUE: Multiplanar, multisequence MR imaging of the cervical spine was performed. No intravenous contrast was administered. COMPARISON:  Brain MRI 04/23/2022. Chest CT 07/16/2021. FINDINGS: Despite anxiolysis medication administration the examination had to be discontinued prior to completion due to patient combativeness. No axial imaging obtained, and sagittal imaging is motion degraded. Alignment: Maintained cervical lordosis. Vertebrae: No marrow edema or acute osseous abnormality is evident. Some cervical spine endplate degeneration, most apparent at C6-C7. Cord: Grossly normal but not well evaluated. The visible upper thoracic spinal canal is capacious. Posterior Fossa, vertebral arteries, paraspinal tissues: Cervicomedullary junction is within normal limits. Brain MRI detailed yesterday. Motion degraded but grossly negative visible neck soft tissues. Questionable abnormal signal in the posterior right upper lobe lung apex visible on both T2 and STIR (series 5, image 1). But on comparison to the January chest CT this appears stable and related to chronic subpleural scarring. Disc levels: Cervical spine disc, endplate, and posterior element degeneration is apparent on this sagittal only exam, but with only mild if any cervical spinal stenosis suspected. IMPRESSION: 1. Truncated and motion degraded exam due to patient combativeness. 2. Cervical spine degeneration seems fairly appropriate for age. No advanced cervical spinal stenosis or definite cervical cord abnormality. And Capacious visible upper thoracic spinal canal. Electronically  Signed   By: Genevie Ann M.D.   On: 04/24/2022 09:32   MR BRAIN WO CONTRAST  Result Date: 04/23/2022 CLINICAL DATA:  Ataxia EXAM: MRI HEAD WITHOUT CONTRAST TECHNIQUE: Multiplanar, multiecho pulse sequences of the brain and surrounding structures were obtained without intravenous contrast. COMPARISON:  None Available. FINDINGS: Motion degraded examination. Brain: No acute infarct, mass effect or extra-axial collection. No acute or chronic hemorrhage. There is multifocal hyperintense T2-weighted signal within the white matter. Generalized volume loss. The midline structures are normal. Vascular: Major flow voids are preserved. Skull and upper cervical spine: Normal calvarium and skull base. Visualized upper cervical spine and soft tissues are normal. Sinuses/Orbits:No paranasal sinus fluid levels or advanced mucosal thickening. No mastoid or middle ear effusion. Normal orbits. IMPRESSION: 1. Motion degraded examination. 2. No acute intracranial abnormality. 3. Findings of chronic small vessel ischemia and volume loss. Electronically Signed   By: Ulyses Jarred M.D.   On: 04/23/2022 23:01   CT HEAD WO CONTRAST (5MM)  Result Date: 04/23/2022 CLINICAL DATA:  Ataxia.  Jerking movements in the left arm. EXAM: CT HEAD WITHOUT CONTRAST TECHNIQUE: Contiguous axial images were obtained from the base of the skull through the vertex without intravenous contrast. RADIATION DOSE REDUCTION: This exam was performed according to the departmental dose-optimization program which includes automated exposure control, adjustment of  the mA and/or kV according to patient size and/or use of iterative reconstruction technique. COMPARISON:  11/09/2021 FINDINGS: Brain: No evidence of acute infarction, hemorrhage, hydrocephalus, extra-axial collection or mass lesion/mass effect. Generalized brain atrophy and confluent chronic small vessel ischemia. Remote lacunar infarct at the posterior left detained min. Vascular: No hyperdense vessel or  unexpected calcification. Skull: Normal. Negative for fracture or focal lesion. Sinuses/Orbits: Unremarkable. IMPRESSION: 1. No acute or reversible finding. 2. Brain atrophy and extensive chronic small vessel ischemia without interval change. Electronically Signed   By: Jorje Guild M.D.   On: 04/23/2022 12:09   DG Chest Portable 1 View  Result Date: 04/20/2022 CLINICAL DATA:  Generalized weakness EXAM: PORTABLE CHEST 1 VIEW COMPARISON:  01/18/2022 FINDINGS: Transverse diameter of heart is increased. Coronary artery calcifications and possible stents are noted. There is previous coronary artery bypass surgery. There are no signs of pulmonary edema or focal pulmonary consolidation. There are small linear densities in both lower lung fields suggesting scarring or subsegmental atelectasis. There is deformity in the right humeral head, possibly due to severe degenerative changes or avascular necrosis. Vascular stents are noted in the left upper arm. IMPRESSION: Cardiomegaly. Coronary artery disease. There are no signs of pulmonary edema or focal pulmonary consolidation. Small linear densities in lower lung fields may suggest scarring or subsegmental atelectasis. Electronically Signed   By: Elmer Picker M.D.   On: 04/20/2022 20:04    Labs:  CBC: Recent Labs    04/22/22 1200 04/23/22 0951 04/24/22 1311 04/25/22 0837  WBC 7.3 8.6 8.0 7.8  HGB 9.4* 9.7* 9.6* 9.6*  HCT 27.9* 29.5* 28.4* 28.1*  PLT 243 280 172 168    COAGS: Recent Labs    11/08/21 1800  INR 1.3*    BMP: Recent Labs    04/23/22 0951 04/23/22 2054 04/24/22 1311 04/25/22 0837  NA 132* 133* 133* 135  K 4.9 5.3* 5.6* 5.1  CL 94* 93* 93* 94*  CO2 23 22 20* 22  GLUCOSE 108* 90 85 102*  BUN 66* 74* 88* 105*  CALCIUM 9.1 9.3 9.1 9.0  CREATININE 8.97* 9.84* 11.05* 12.47*  GFRNONAA 4* 4* 3* 3*    LIVER FUNCTION TESTS: Recent Labs    04/15/22 1710 04/20/22 2027 04/21/22 0636 04/21/22 2200 04/23/22 0951  04/23/22 2054 04/24/22 1311 04/25/22 0837  BILITOT 0.6 0.5 0.7  --   --  1.0  --   --   AST 9* 14* 16  --   --  15  --   --   ALT '12 14 12  '$ --   --  13  --   --   ALKPHOS 115 98 90  --   --  82  --   --   PROT 7.1 7.3 6.8  --   --  6.4*  --   --   ALBUMIN 3.6 3.5 3.2*   < > 3.2* 2.9* 3.0* 2.8*   < > = values in this interval not displayed.    TUMOR MARKERS: No results for input(s): "AFPTM", "CEA", "CA199", "CHROMGRNA" in the last 8760 hours.  Assessment and Plan: LUE AVG cannulation difficulty Patient transferred from [facility] to [facility] due to Neurologic concerns.  She is continuing to receive HD. Since admission here she has undergone HD treatment x1 without difficulty or machine alarms.   Upon assessment today her graft has positive bruit/thrill.  Discussed with Nephrology.  Plan to check AM labs to determine whether HD successful.   Patient has a contrast  allergy and requires pre-medication if planning to proceed.  Plan made to check AM labs and proceed with pre-medication/shuntogram for Wednesday if needed.   Thank you for this interesting consult.  I greatly enjoyed meeting Shawna [name] and look forward to participating in their care.  A copy of this report was sent to the requesting provider on this date.  Electronically Signed: Docia Barrier, PA 04/25/2022, 4:07 PM   I spent a total of 20 Minutes    in face to face in clinical consultation, greater than 50% of which was counseling/coordinating care for LUE AVG malfunction.

## 2022-04-25 NOTE — Evaluation (Signed)
Occupational Therapy Evaluation Patient Details Name: Shawna [name] MRN: [id number] DOB: 08/03/1940 Today's Date: 04/25/2022   History of Present Illness Shawna [name] is a 81 y.o.female presented to ED with a chief complaint of generalized weakness and Hgb of 6.6 with antibodies present so was transfused. 10/13 developed confusion. 10/14 developed ataxia jerking upper extremities face + dysarthria--CT negative for stroke; MRI: negative. PHMx: congestive heart failure, anxiety, coronary artery disease, chronic anemia, ESRD on hemodialysis, GERD, hyperlipidemia, hypertension, iron deficiency anemia, atrial fibrillation paroxysmal. Patient is able to ambulate with either a cane or walker at baseline.   Clinical Impression   This 81 yo female admitted with above presents to acute OT with PLOF of being able to ambulate with a walker or cane and needing some A for basic ADLs. She currently is total A for bed mobility and for ADLs at bed level. Pt with lethargy, not following commands, and eyes open only 75% of session. She will continue to benefit from acute OT with follow up at SNF.      Recommendations for follow up therapy are one component of a multi-disciplinary discharge planning process, led by the attending physician.  Recommendations may be updated based on patient status, additional functional criteria and insurance authorization.   Follow Up Recommendations  Skilled nursing-short term rehab (<3 hours/day)    Assistance Recommended at Discharge Frequent or constant Supervision/Assistance  Patient can return home with the following Two people to help with walking and/or transfers;A lot of help with bathing/dressing/bathroom;Assistance with cooking/housework;Assistance with feeding;Help with stairs or ramp for entrance;Assist for transportation;Direct supervision/assist for financial management;Direct supervision/assist for medications management    Functional Status Assessment  Patient has  had a recent decline in their functional status and demonstrates the ability to make significant improvements in function in a reasonable and predictable amount of time.  Equipment Recommendations  Other (comment) (TBD next venue)       Precautions / Restrictions Precautions Precautions: Fall Restrictions Weight Bearing Restrictions: No      Mobility Bed Mobility Overal bed mobility: Needs Assistance Bed Mobility: Supine to Sit, Sit to Supine     Supine to sit: Total assist Sit to supine: Total assist            Balance Overall balance assessment: Needs assistance Sitting-balance support: No upper extremity supported, Feet unsupported Sitting balance-Leahy Scale: Fair                                     ADL either performed or assessed with clinical judgement   ADL                                         General ADL Comments: total A     Vision Baseline Vision/History: 1 Wears glasses Patient Visual Report:  (unknown--pt not able to state and she kept eyes closed 75% of session)              Pertinent Vitals/Pain Pain Assessment Pain Assessment: PAINAD Breathing: normal Negative Vocalization: none Facial Expression: smiling or inexpressive Body Language: relaxed Consolability: no need to console PAINAD Score: 0     Hand Dominance Right   Extremity/Trunk Assessment Upper Extremity Assessment Upper Extremity Assessment: Generalized weakness (no myoclonus seen today)           Communication  Communication Communication: No difficulties   Cognition Arousal/Alertness: Lethargic Behavior During Therapy: Flat affect Overall Cognitive Status: Impaired/Different from baseline Area of Impairment: Orientation, Following commands, Safety/judgement, Awareness, Problem solving                 Orientation Level:  (not able to answer my questions; answered "yes" to everything I asked (even open ended questions))      Following Commands:  (no following one step commands--would answer "Ok" but then not move) Safety/Judgement: Decreased awareness of safety, Decreased awareness of deficits Awareness: Intellectual Problem Solving: Slow processing, Decreased initiation, Requires verbal cues, Difficulty sequencing, Requires tactile cues                  Home Living Family/patient expects to be discharged to:: Private residence Living Arrangements: Spouse/significant other Available Help at Discharge: Family;Available 24 hours/day Type of Home: House Home Access: Stairs to enter [employer] of Steps: 2 Entrance Stairs-Rails: Right Home Layout: One level     Bathroom Shower/Tub: Teacher, early years/pre: Standard Bathroom Accessibility: Yes   Home Equipment: Rollator (4 wheels);Cane - single point   Additional Comments: All information taken from PT note from 4 days ago--pt is not able to answer questions today due to confusion and perserveration on word "yes"      Prior Functioning/Environment Prior Level of Function : Needs assist;Independent/Modified Independent;Patient poor historian/Family not available       Physical Assist : Mobility (physical) Mobility (physical): Bed mobility;Transfers;Gait;Stairs   Mobility Comments: Patient states uses rollator or SPC PRN; spouse assists with stairs ADLs Comments: assisted by family        OT Problem List: Decreased strength;Decreased range of motion;Decreased activity tolerance;Impaired balance (sitting and/or standing);Impaired UE functional use;Decreased coordination;Decreased cognition;Decreased safety awareness      OT Treatment/Interventions: Self-care/ADL training;DME and/or AE instruction;Patient/family education;Balance training    OT Goals(Current goals can be found in the care plan section) Acute Rehab OT Goals Patient Stated Goal: pt unable to state OT Goal Formulation: Patient unable to participate in goal  setting Time For Goal Achievement: 05/09/22 Potential to Achieve Goals: Fair  OT Frequency: Min 2X/week       AM-PAC OT "6 Clicks" Daily Activity     Outcome Measure Help from another person eating meals?: Total Help from another person taking care of personal grooming?: Total Help from another person toileting, which includes using toliet, bedpan, or urinal?: Total Help from another person bathing (including washing, rinsing, drying)?: Total Help from another person to put on and taking off regular upper body clothing?: Total Help from another person to put on and taking off regular lower body clothing?: Total 6 Click Score: 6   End of Session    Activity Tolerance: Patient limited by lethargy Patient left: in bed;with call bell/phone within reach;with bed alarm set  OT Visit Diagnosis: Other abnormalities of gait and mobility (R26.89);Muscle weakness (generalized) (M62.81);Other symptoms and signs involving cognitive function;Adult, failure to thrive (R62.7)                Time: 1359-1410 OT Time Calculation (min): 11 min Charges:  OT General Charges $OT Visit: 1 Visit OT Evaluation $OT Eval Moderate Complexity: [place], OTR/L Acute [employer] [facility] [phone] Office [phone]    Almon Register 04/25/2022, 4:02 PM

## 2022-04-25 NOTE — Procedures (Signed)
Patient was seen on dialysis and the procedure was supervised.  BFR 350  Via AVG BP is  192/62.   Patient appears to be tolerating treatment well-  concern for AVG malfunction but running OK here-  BP high-  stop middrine   Louis Meckel 04/25/2022

## 2022-04-25 NOTE — Progress Notes (Addendum)
PROGRESS NOTE   Shawna [name]  VOH:[id number] DOB: 04/04/1941 DOA: 04/20/2022 PCP: [facility]  Brief Narrative:  81 year old African-American female CAD status post CABG with distal small vessel disease 2013 (recent [place] 07/2021 = all grafts patent), HFrEF + grade 2 diastolic HF PEF 69-48% 11/11/6268 recurrent GI bleed with chronic anemia precluding anticoagulation for Recurrent a flutter and?  Microangiopathic angina ESRD dialysis MWF History of colon and ovarian cancer seen by oncology in the past with normocytic anemia   Seen at [facility] hospital 4/9 with left ear pain chest pain shortness of breath-troponins bumped from 30-90 but was thought to be secondary to fluid overload not ACS and patient was discharged home as EKG was stable  Admitted 4/10 2/2 recurrent intermittent chest reminiscent of her angina- previously treated in the outpatient for Pseudomonas otitis by ENT  Admit 5/1 through 5/17 for toxic metabolic encephalopathy secondary to malignant otitis externa cefepime induced neurotoxicity side effect of gabapentin Xanax narcotics  Hide represented to [facility] ED with reports of low hemoglobin on 10/6 and counts were 7.8 and she was sent home  Again return to [facility] ED 10/11 with generalized weakness limiting mobility requiring increasing assistance--- on evaluation hemoglobin 6.6 FOBT negative, troponins 20s, bradycardic on monitors and was admitte for blood transfusion as she had increased antibodies  10/13 confusion noted-had received Atarax 100-this was decreased to 25 10/14 patient developed ataxia jerking upper extremities face + dysarthria--CT negative for stroke--sent over to [facility] for MRI brain TSH abnormal 4.7 T41.16, ammonia 30 Neurologist consulted-saw the patient recommended work-up with MRIs and placed on Keppra but then patient became sleepy   Hospital-Problem based course  Toxic metabolic encephalopathy with myoclonus MRI brain negative for  stroke-MR neck no specific abnormality ?  Amiodarone versus Flexeril Xanax-Flexeril Xanax discontinued-Ativan (preprocedural) discontinued  Will discontinue Atarax 25 completely Neurology saw patient 10/16 discontinued Keppra given somnolence Observe for further myoclonus and hopeful for improvement of mental status back to baseline  ESRD MWF at [facility] in [place] usually 3-1/2 hours using left upper extremity AVG Truncated sessions on 10/13-has poorly functioning left arm fistula which is to be intervened upon 10/17 Continue calcitriol 2.2, Sensipar 30 MWF Nephrology discontinued 10/16 midodrine 5 to 10   Mild hyperkalemia 5.3 Defer management to nephrology May managed with dialysis was on Lokelma 3 times daily  Anemia of renal disease FOBT -10/11, iron 58, saturations was 23 10/12 Patient was transfused 2 units PRBC 10/12--hemoglobin has stabilized in the 9 range Continue iron as 200 q. as per renal  Permanent A-fib CHADVASC >3 not on anticoagulation secondary to bleed Has chronic left bundle additionally Continue metoprolol 12.5 twice daily, lower amio form 350-->093 qd Systolic and diastolic heart failure EF as above CAD with CABG in 2013 and all grafts patent on left heart cath 07/2021 Manage with dialysis and meds as above  ?  TSH mediated hyperthyroidism versus effect of amiodarone Await T3 which would help [place] differentiate the 2- didcut back dose of amnio   May need assistance of endocrinology but we do not have an endocrinologist available-May reach out to outpatient specialists 10/16 for assistance if they can provide  generalized weakness Requires skilled placement usually uses a cane at baseline-SNF when medically stabilized  Anxiety See above discussion Atarax DC 10/16   DVT prophylaxis: Heparin Code Status: Full Family Communication: called Shawna [name] [phone] and updated Disposition:  Status is: Inpatient Remains inpatient appropriate because:    Requires work-up of myoclonus--- may require  skilled care and therapy to work with her in the next several days She is still very sleepy   Consultants:  Neurology Nephrology  Procedures: n  Antimicrobials: n    Subjective:  Seen on HD unit-seems to be able to get HD through her access No chest pain no fever no nausea she is confused he thinks she is in [place] And cannot orient completely  ROM is intact no focal deficit and I am not able to really appreciate anything but a little bit of tremor in her right hand-exam is circumscribed as left hand is getting dialysis She is able to raise both legs off the bed    Objective: Vitals:   04/25/22 0820 04/25/22 0837 04/25/22 0900 04/25/22 0930  BP: (!) 185/70 (!) 183/71 (!) 201/159 (!) 178/92  Pulse: (!) 57 (!) 57 61 62  Resp: '17 20 18 '$ (!) 21  Temp: 98 F (36.7 C)     TempSrc: Oral     SpO2: 94% 99% 96% 96%  Weight: 60.4 kg     Height:        Intake/Output Summary (Last 24 hours) at 04/25/2022 0953 Last data filed at 04/25/2022 0600 Gross per 24 hour  Intake 120 ml  Output 0 ml  Net 120 ml    Filed Weights   04/23/22 0637 04/23/22 1800 04/25/22 0820  Weight: 59 kg 60.8 kg 60.4 kg    Examination:  EOMI NCAT no focal deficit no icterus no pallor Chest is clear S1-S2 no murmur sinus, sinus tach Fistula in left upper extremity seems to be working Abdomen is soft no rebound no guarding Neurologically moving right upper extremity and lower extremities well-slightly tremulous in right arm    Data Reviewed: personally reviewed   CBC    Component Value Date/Time   WBC 7.8 04/25/2022 0837   RBC 2.76 (L) 04/25/2022 0837   HGB 9.6 (L) 04/25/2022 0837   HCT 28.1 (L) 04/25/2022 0837   PLT 168 04/25/2022 0837   MCV 101.8 (H) 04/25/2022 0837   MCH 34.8 (H) 04/25/2022 0837   MCHC 34.2 04/25/2022 0837   RDW 18.1 (H) 04/25/2022 0837   LYMPHSABS 0.8 04/21/2022 0636   MONOABS 0.5 04/21/2022 0636   EOSABS 0.4  04/21/2022 0636   BASOSABS 0.1 04/21/2022 0636      Latest Ref Rng & Units 04/25/2022    8:37 AM 04/24/2022    1:11 PM 04/23/2022    8:54 PM  CMP  Glucose 70 - 99 mg/dL 102  85  90   BUN 8 - 23 mg/dL 105  88  74   Creatinine 0.44 - 1.00 mg/dL 12.47  11.05  9.84   Sodium 135 - 145 mmol/L 135  133  133   Potassium 3.5 - 5.1 mmol/L 5.1  5.6  5.3   Chloride 98 - 111 mmol/L 94  93  93   CO2 22 - 32 mmol/L '22  20  22   '$ Calcium 8.9 - 10.3 mg/dL 9.0  9.1  9.3   Total Protein 6.5 - 8.1 g/dL   6.4   Total Bilirubin 0.3 - 1.2 mg/dL   1.0   Alkaline Phos 38 - 126 U/L   82   AST 15 - 41 U/L   15   ALT 0 - 44 U/L   13      Radiology Studies: MR CERVICAL SPINE WO CONTRAST  Result Date: 04/24/2022 CLINICAL DATA:  81 year old female with ataxia. Combative. Confusion and weakness.  Dialysis patient. EXAM: MRI CERVICAL SPINE WITHOUT CONTRAST TECHNIQUE: Multiplanar, multisequence MR imaging of the cervical spine was performed. No intravenous contrast was administered. COMPARISON:  Brain MRI 04/23/2022. Chest CT 07/16/2021. FINDINGS: Despite anxiolysis medication administration the examination had to be discontinued prior to completion due to patient combativeness. No axial imaging obtained, and sagittal imaging is motion degraded. Alignment: Maintained cervical lordosis. Vertebrae: No marrow edema or acute osseous abnormality is evident. Some cervical spine endplate degeneration, most apparent at C6-C7. Cord: Grossly normal but not well evaluated. The visible upper thoracic spinal canal is capacious. Posterior Fossa, vertebral arteries, paraspinal tissues: Cervicomedullary junction is within normal limits. Brain MRI detailed yesterday. Motion degraded but grossly negative visible neck soft tissues. Questionable abnormal signal in the posterior right upper lobe lung apex visible on both T2 and STIR (series 5, image 1). But on comparison to the January chest CT this appears stable and related to chronic  subpleural scarring. Disc levels: Cervical spine disc, endplate, and posterior element degeneration is apparent on this sagittal only exam, but with only mild if any cervical spinal stenosis suspected. IMPRESSION: 1. Truncated and motion degraded exam due to patient combativeness. 2. Cervical spine degeneration seems fairly appropriate for age. No advanced cervical spinal stenosis or definite cervical cord abnormality. And Capacious visible upper thoracic spinal canal. Electronically Signed   By: Genevie Ann M.D.   On: 04/24/2022 09:32   MR BRAIN WO CONTRAST  Result Date: 04/23/2022 CLINICAL DATA:  Ataxia EXAM: MRI HEAD WITHOUT CONTRAST TECHNIQUE: Multiplanar, multiecho pulse sequences of the brain and surrounding structures were obtained without intravenous contrast. COMPARISON:  None Available. FINDINGS: Motion degraded examination. Brain: No acute infarct, mass effect or extra-axial collection. No acute or chronic hemorrhage. There is multifocal hyperintense T2-weighted signal within the white matter. Generalized volume loss. The midline structures are normal. Vascular: Major flow voids are preserved. Skull and upper cervical spine: Normal calvarium and skull base. Visualized upper cervical spine and soft tissues are normal. Sinuses/Orbits:No paranasal sinus fluid levels or advanced mucosal thickening. No mastoid or middle ear effusion. Normal orbits. IMPRESSION: 1. Motion degraded examination. 2. No acute intracranial abnormality. 3. Findings of chronic small vessel ischemia and volume loss. Electronically Signed   By: Ulyses Jarred M.D.   On: 04/23/2022 23:01   CT HEAD WO CONTRAST (5MM)  Result Date: 04/23/2022 CLINICAL DATA:  Ataxia.  Jerking movements in the left arm. EXAM: CT HEAD WITHOUT CONTRAST TECHNIQUE: Contiguous axial images were obtained from the base of the skull through the vertex without intravenous contrast. RADIATION DOSE REDUCTION: This exam was performed according to the departmental  dose-optimization program which includes automated exposure control, adjustment of the mA and/or kV according to patient size and/or use of iterative reconstruction technique. COMPARISON:  11/09/2021 FINDINGS: Brain: No evidence of acute infarction, hemorrhage, hydrocephalus, extra-axial collection or mass lesion/mass effect. Generalized brain atrophy and confluent chronic small vessel ischemia. Remote lacunar infarct at the posterior left detained min. Vascular: No hyperdense vessel or unexpected calcification. Skull: Normal. Negative for fracture or focal lesion. Sinuses/Orbits: Unremarkable. IMPRESSION: 1. No acute or reversible finding. 2. Brain atrophy and extensive chronic small vessel ischemia without interval change. Electronically Signed   By: Jorje Guild M.D.   On: 04/23/2022 12:09     Scheduled Meds:  amiodarone  100 mg Oral Daily   calcitRIOL  2.25 mcg Oral Q M,W,F-HD   Chlorhexidine Gluconate Cloth  6 each Topical Q0600   cinacalcet  30 mg Oral Q M,W,F-HD  darbepoetin (ARANESP) injection - DIALYSIS  200 mcg Intravenous Q Fri-HD   levothyroxine  25 mcg Oral Daily   metoprolol tartrate  12.5 mg Oral BID   pantoprazole  40 mg Oral Daily   rosuvastatin  10 mg Oral Daily   sevelamer carbonate  2,400 mg Oral TID WC   sodium zirconium cyclosilicate  10 g Oral TID   Continuous Infusions:  anticoagulant sodium citrate       LOS: 2 days   Time spent: [place], MD [facility] To contact the attending provider between 7A-7P or the covering provider during after hours 7P-7A, please log into the web site [email or web address] and access using universal [place] password for that web site. If you do not have the password, please call the hospital operator.  04/25/2022, 9:53 AM

## 2022-04-26 DIAGNOSIS — R531 Weakness: Secondary | ICD-10-CM | POA: Diagnosis not present

## 2022-04-26 DIAGNOSIS — G253 Myoclonus: Secondary | ICD-10-CM | POA: Diagnosis not present

## 2022-04-26 LAB — RENAL FUNCTION PANEL
Albumin: 2.6 g/dL — ABNORMAL LOW (ref 3.5–5.0)
Anion gap: 16 — ABNORMAL HIGH (ref 5–15)
BUN: 56 mg/dL — ABNORMAL HIGH (ref 8–23)
CO2: 25 mmol/L (ref 22–32)
Calcium: 9.1 mg/dL (ref 8.9–10.3)
Chloride: 97 mmol/L — ABNORMAL LOW (ref 98–111)
Creatinine, Ser: 8.53 mg/dL — ABNORMAL HIGH (ref 0.44–1.00)
GFR, Estimated: 4 mL/min — ABNORMAL LOW
Glucose, Bld: 81 mg/dL (ref 70–99)
Phosphorus: 6 mg/dL — ABNORMAL HIGH (ref 2.5–4.6)
Potassium: 4.1 mmol/L (ref 3.5–5.1)
Sodium: 138 mmol/L (ref 135–145)

## 2022-04-26 MED ORDER — PROSOURCE PLUS PO LIQD
30.0000 mL | Freq: Two times a day (BID) | ORAL | Status: DC
  Administered 2022-04-26: 30 mL via ORAL
  Filled 2022-04-26: qty 30

## 2022-04-26 MED ORDER — CHLORHEXIDINE GLUCONATE CLOTH 2 % EX PADS: 6.0000 | MEDICATED_PAD | Freq: Every day | CUTANEOUS | Status: DC

## 2022-04-26 MED ORDER — AMIODARONE HCL 100 MG PO TABS: 100.0000 mg | ORAL_TABLET | Freq: Every day | ORAL | 0 refills | Status: DC

## 2022-04-26 NOTE — Progress Notes (Signed)
Pt receives out-pt HD at [facility] on MWF. Contacted clinic and spoke to [place]. Clinic advised pt will d/c today and resume care tomorrow. D/C summary and last renal note faxed to clinic for continuation of care.   Melven Sartorius Renal Navigator [phone]

## 2022-04-26 NOTE — Discharge Summary (Signed)
Physician Discharge Summary  Shawna [name] ZDG:[id number] DOB: [birth year] DOA: 04/20/2022  PCP: [facility]  Admit date: 04/20/2022 Discharge date: 04/26/2022  Time spent: 36 minutes  Recommendations for Outpatient Follow-up:  Discontinued various meds this admission which may have caused encephalopathy including Ativan and hydroxyzine--- amiodarone cut back, midodrine discontinued Dialysis as per usual schedule Outpatient labs etc. Outpatient screening for malignancies in the past has had colon and ovarian CA Follow T3 in the outpatient setting and consider getting TFTs because of abnormalities during hospital stay  Discharge Diagnoses:  MAIN problem for hospitalization   Isolated Myoclonus probably related to combination of under dialysis because of access malfunction in addition to buildup of metabolites of Ativan, hydroxyzine etc. ESRD MWF [facility] Mild hyperkalemia now resolved Anemia renal disease Permanent A-fib not on anticoagulation with doses changes of amiodarone this admission    Please see below for itemized issues addressed in HOpsital- refer to other progress notes for clarity if needed  Discharge Condition: Improved  Diet recommendation: Renal  Filed Weights   04/23/22 1800 04/25/22 0820 04/25/22 1210  Weight: 60.8 kg 60.4 kg 58.2 kg    History of present illness:  81 year old African-American female CAD status post CABG with distal small vessel disease 2013 (recent [place] 07/2021 = all grafts patent), HFrEF + grade 2 diastolic HF PEF 95-18% 02/12/1659 recurrent GI bleed with chronic anemia precluding anticoagulation for Recurrent a flutter and?  Microangiopathic angina ESRD dialysis MWF History of colon and ovarian cancer seen by oncology in the past with normocytic anemia   Seen at [facility] hospital 4/9 with left ear pain chest pain shortness of breath-troponins bumped from 30-90 but was thought to be secondary to fluid overload not ACS and  patient was discharged home as EKG was stable  Admitted 4/10 2/2 recurrent intermittent chest reminiscent of her angina- previously treated in the outpatient for Pseudomonas otitis by ENT   Admit 5/1 through 5/17 for toxic metabolic encephalopathy secondary to malignant otitis externa cefepime induced neurotoxicity side effect of gabapentin Xanax narcotics   Hide represented to [facility] ED with reports of low hemoglobin on 10/6 and counts were 7.8 and she was sent home   Again return to [facility] ED 10/11 with generalized weakness limiting mobility requiring increasing assistance--- on evaluation hemoglobin 6.6 FOBT negative, troponins 20s, bradycardic on monitors and was admitte for blood transfusion as she had increased antibodies   10/13 confusion noted-had received Atarax 100-this was decreased to 25 10/14 patient developed ataxia jerking upper extremities face + dysarthria--CT negative for stroke--sent over to [facility] for MRI brain TSH abnormal 4.7 T41.16, ammonia 30 Neurologist consulted-saw the patient recommended work-up with MRIs and placed on Keppra but then patient became sleepy  Hospital Course:  Toxic metabolic encephalopathy with myoclonus MRI brain negative for stroke-MR neck no specific abnormality ?  Amiodarone versus Flexeril Xanax-Flexeril Xanax discontinued-Ativan (preprocedural) discontinued  Will discontinue Atarax 25 completely Neurology saw patient 10/16 discontinued Keppra given somnolence Patient returned to premorbid baseline was singing and able to ambulate in the room on 10/17 and uses a walker at baseline so I feel she does not need skilled facility and can return home   ESRD MWF at [facility] in [place] usually 3-1/2 hours using left upper extremity AVG Truncated sessions on 10/13- poorly functioning left arm fistula was able to be used for the full dialysis session on 10/16 so repair of L UE AVG was deferred and this can be used in the outpatient setting Continue  calcitriol 2.2, Sensipar 30 MWF as outpatient Midodrine was discontinued this admission as well   Mild hyperkalemia 5.3 Defer management to nephrology May managed with dialysis was on Lokelma 3 times daily   Anemia of renal disease FOBT -10/11, iron 58, saturations was 23 10/12 Patient was transfused 2 units PRBC 10/12--hemoglobin has stabilized in the 9 range Continue iron as 200 q. as per renal   Permanent A-fib CHADVASC >3 not on anticoagulation secondary to bleed Has chronic left bundle additionally Continue metoprolol 12.5 twice daily, lower amio form 027-->741 qd Systolic and diastolic heart failure EF as above CAD with CABG in 2013 and all grafts patent on left heart cath 07/2021 Manage with dialysis and meds as above   ?  TSH mediated hyperthyroidism versus effect of amiodarone Follow T3 in the outpatient setting but this was likely the effect of amiodarone as her mentation improved significantly  generalized weakness Requires skilled placement usually uses a cane at baseline and can use walker   Anxiety See above discussion Atarax DC 10/16   Discharge Exam: Vitals:   04/26/22 0424 04/26/22 0827  BP: (!) 171/64 (!) 164/63  Pulse: (!) 59 60  Resp: 18 16  Temp: 98.1 F (36.7 C) 97.7 F (36.5 C)  SpO2: 100% 98%    Subj on day of d/c   Awake coherent singing and pleasant-was able to get up from the bed with minimal assistance and walk around the room although took time  General Exam on discharge  EOMI NCAT Arcus senilis present Chest is clear No submandibular swelling S1-S2 seems slightly bradycardic on monitors Abdomen soft no rebound LUE AVG working and has good thrill Power is 5/5 grossly-rest of neuro exam deferred  Discharge Instructions   Discharge Instructions     Diet - low sodium heart healthy   Complete by: As directed    Discharge instructions   Complete by: As directed    Look at medication changes--we have cut back the Amiodarone and  stopped ativan and hydroxyzine IF u have issues with ur access let ur kidney doctor know Go to Dialysis as per ur usual schedule  Getr labs in about 1 week or so as per usual schedules   Increase activity slowly   Complete by: As directed    No wound care   Complete by: As directed       Allergies as of 04/26/2022       Reactions   Amlodipine Swelling   Aspirin Other (See Comments)   High Doses Mess up her stomach; "makes my bowels have blood in them". Takes 81 mg EC Aspirin    Nitrofurantoin Hives   Bactrim [sulfamethoxazole-trimethoprim] Rash   Contrast Media [iodinated Contrast Media] Itching   Iron Itching, Other (See Comments)   "they gave me iron in dialysis; had to give me Benadryl cause I had to have the iron" (05/02/2012)   Tylenol [acetaminophen] Itching, Other (See Comments)   Makes her feet on fire per pt   Gabapentin Other (See Comments)   Unknown reaction   Iron Sucrose Other (See Comments)   Unknown   Ranexa [ranolazine] Other (See Comments)   Myoclonus-hospitalized    Sucroferric Oxyhydroxide Other (See Comments)   Unknown   Dexilant [dexlansoprazole] Other (See Comments)   Upset stomach   Hydralazine Itching   Has tolerated while inpatient Has tolerated while inpatient   Levaquin [levofloxacin In D5w] Rash   Morphine And Related Itching, Other (See Comments)   Itching in feet  Pantoprazole Rash   Plavix [clopidogrel Bisulfate] Rash   Protonix [pantoprazole Sodium] Rash   Venofer [ferric Oxide] Itching, Other (See Comments)   Patient reports using Benadryl prior to doses as [place]        Medication List     STOP taking these medications    ALPRAZolam 0.5 MG tablet Commonly known as: XANAX   hydrOXYzine 100 MG capsule Commonly known as: VISTARIL   midodrine 5 MG tablet Commonly known as: PROAMATINE       TAKE these medications    amiodarone 100 MG tablet Commonly known as: PACERONE Take 1 tablet (100 mg total) by mouth  daily. Start taking on: April 27, 2022 What changed: how much to take   Aspirin 81 MG Caps Take 1 capsule by mouth daily at 12 noon.   cyclobenzaprine 5 MG tablet Commonly known as: FLEXERIL Take 5 mg by mouth 3 (three) times daily as needed.   levothyroxine 25 MCG tablet Commonly known as: SYNTHROID Take 1 tablet (25 mcg total) by mouth daily.   metoprolol tartrate 25 MG tablet Commonly known as: LOPRESSOR Take 0.5 tablets (12.5 mg total) by mouth 2 (two) times daily.   multivitamin Tabs tablet Take 1 tablet by mouth daily.   nitroGLYCERIN 0.4 MG SL tablet Commonly known as: NITROSTAT Place 0.4 mg under the tongue every 5 (five) minutes as needed for chest pain.   omeprazole 20 MG capsule Commonly known as: PRILOSEC Take 1 capsule (20 mg total) by mouth daily. What changed:  how much to take when to take this reasons to take this   rosuvastatin 10 MG tablet Commonly known as: Crestor Take 1 tablet (10 mg total) by mouth daily.   sevelamer carbonate 800 MG tablet Commonly known as: RENVELA Take 2,400 mg by mouth 3 (three) times daily.       Allergies  Allergen Reactions   Amlodipine Swelling   Aspirin Other (See Comments)    High Doses Mess up her stomach; "makes my bowels have blood in them". Takes 81 mg EC Aspirin    Nitrofurantoin Hives   Bactrim [Sulfamethoxazole-Trimethoprim] Rash   Contrast Media [Iodinated Contrast Media] Itching   Iron Itching and Other (See Comments)    "they gave me iron in dialysis; had to give me Benadryl cause I had to have the iron" (05/02/2012)   Tylenol [Acetaminophen] Itching and Other (See Comments)    Makes her feet on fire per pt   Gabapentin Other (See Comments)    Unknown reaction   Iron Sucrose Other (See Comments)    Unknown   Ranexa [Ranolazine] Other (See Comments)    Myoclonus-hospitalized    Sucroferric Oxyhydroxide Other (See Comments)    Unknown   Dexilant [Dexlansoprazole] Other (See Comments)     Upset stomach   Hydralazine Itching    Has tolerated while inpatient Has tolerated while inpatient   Levaquin [Levofloxacin In D5w] Rash   Morphine And Related Itching and Other (See Comments)    Itching in feet   Pantoprazole Rash   Plavix [Clopidogrel Bisulfate] Rash   Protonix [Pantoprazole Sodium] Rash   Venofer [Ferric Oxide] Itching and Other (See Comments)    Patient reports using Benadryl prior to doses as [place] information for follow-up providers     Schedule an appointment as soon as possible for a visit  with Derek Jack, MD.   Specialty: Hematology Why: For further evaluation of your anemia Contact information: [address] [place] [address] [phone]              Contact information for after-discharge care     Destination     [facility] .   Service: Skilled Nursing Contact information: [address] [place] [phone]                      The results of significant diagnostics from this hospitalization (including imaging, microbiology, ancillary and laboratory) are listed below for reference.    Significant Diagnostic Studies: MR CERVICAL SPINE WO CONTRAST  Result Date: 04/24/2022 CLINICAL DATA:  81 year old female with ataxia. Combative. Confusion and weakness. Dialysis patient. EXAM: MRI CERVICAL SPINE WITHOUT CONTRAST TECHNIQUE: Multiplanar, multisequence MR imaging of the cervical spine was performed. No intravenous contrast was administered. COMPARISON:  Brain MRI 04/23/2022. Chest CT 07/16/2021. FINDINGS: Despite anxiolysis medication administration the examination had to be discontinued prior to completion due to patient combativeness. No axial imaging obtained, and sagittal imaging is motion degraded. Alignment: Maintained cervical lordosis. Vertebrae: No marrow edema or acute osseous abnormality is evident. Some  cervical spine endplate degeneration, most apparent at C6-C7. Cord: Grossly normal but not well evaluated. The visible upper thoracic spinal canal is capacious. Posterior Fossa, vertebral arteries, paraspinal tissues: Cervicomedullary junction is within normal limits. Brain MRI detailed yesterday. Motion degraded but grossly negative visible neck soft tissues. Questionable abnormal signal in the posterior right upper lobe lung apex visible on both T2 and STIR (series 5, image 1). But on comparison to the January chest CT this appears stable and related to chronic subpleural scarring. Disc levels: Cervical spine disc, endplate, and posterior element degeneration is apparent on this sagittal only exam, but with only mild if any cervical spinal stenosis suspected. IMPRESSION: 1. Truncated and motion degraded exam due to patient combativeness. 2. Cervical spine degeneration seems fairly appropriate for age. No advanced cervical spinal stenosis or definite cervical cord abnormality. And Capacious visible upper thoracic spinal canal. Electronically Signed   By: Genevie Ann M.D.   On: 04/24/2022 09:32   MR BRAIN WO CONTRAST  Result Date: 04/23/2022 CLINICAL DATA:  Ataxia EXAM: MRI HEAD WITHOUT CONTRAST TECHNIQUE: Multiplanar, multiecho pulse sequences of the brain and surrounding structures were obtained without intravenous contrast. COMPARISON:  None Available. FINDINGS: Motion degraded examination. Brain: No acute infarct, mass effect or extra-axial collection. No acute or chronic hemorrhage. There is multifocal hyperintense T2-weighted signal within the white matter. Generalized volume loss. The midline structures are normal. Vascular: Major flow voids are preserved. Skull and upper cervical spine: Normal calvarium and skull base. Visualized upper cervical spine and soft tissues are normal. Sinuses/Orbits:No paranasal sinus fluid levels or advanced mucosal thickening. No mastoid or middle ear effusion. Normal orbits.  IMPRESSION: 1. Motion degraded examination. 2. No acute intracranial abnormality. 3. Findings of chronic small vessel ischemia and volume loss. Electronically Signed   By: Ulyses Jarred M.D.   On: 04/23/2022 23:01   CT HEAD WO CONTRAST (5MM)  Result Date: 04/23/2022 CLINICAL DATA:  Ataxia.  Jerking movements in the left arm. EXAM: CT HEAD WITHOUT CONTRAST TECHNIQUE: Contiguous axial images were obtained from the base of the skull through the vertex without intravenous contrast. RADIATION DOSE REDUCTION: This exam was performed according to the departmental dose-optimization program which includes automated exposure control, adjustment of the mA and/or kV according to patient size and/or use of iterative reconstruction technique. COMPARISON:  11/09/2021 FINDINGS: Brain: No evidence of acute infarction, hemorrhage, hydrocephalus, extra-axial collection or mass lesion/mass effect. Generalized brain atrophy and confluent chronic small vessel ischemia. Remote lacunar infarct at the posterior left detained min. Vascular: No hyperdense vessel or unexpected calcification. Skull: Normal. Negative for fracture or focal lesion. Sinuses/Orbits: Unremarkable. IMPRESSION: 1. No acute or reversible finding. 2. Brain atrophy and extensive chronic small vessel ischemia without interval change. Electronically Signed   By: Jorje Guild M.D.   On: 04/23/2022 12:09   DG Chest Portable 1 View  Result Date: 04/20/2022 CLINICAL DATA:  Generalized weakness EXAM: PORTABLE CHEST 1 VIEW COMPARISON:  01/18/2022 FINDINGS: Transverse diameter of heart is increased. Coronary artery calcifications and possible stents are noted. There is previous coronary artery bypass surgery. There are no signs of pulmonary edema or focal pulmonary consolidation. There are small linear densities in both lower lung fields suggesting scarring or subsegmental atelectasis. There is deformity in the right humeral head, possibly due to severe degenerative  changes or avascular necrosis. Vascular stents are noted in the left upper arm. IMPRESSION: Cardiomegaly. Coronary artery disease. There are no signs of pulmonary edema or focal pulmonary consolidation. Small linear densities in lower lung fields may suggest scarring or subsegmental atelectasis. Electronically Signed   By: Elmer Picker M.D.   On: 04/20/2022 20:04    Microbiology: No results found for this or any previous visit (from the past 240 hour(s)).   Labs: Basic Metabolic Panel: Recent Labs  Lab 04/21/22 0636 04/21/22 2200 04/22/22 1200 04/23/22 0951 04/23/22 2054 04/24/22 1028 04/24/22 1311 04/25/22 0837 04/26/22 0336  NA 135   < > 132* 132* 133*  --  133* 135 138  K 4.3   < > 4.6 4.9 5.3*  --  5.6* 5.1 4.1  CL 98   < > 95* 94* 93*  --  93* 94* 97*  CO2 25   < > '22 23 22  '$ --  20* 22 25  GLUCOSE 86   < > 120* 108* 90  --  85 102* 81  BUN 39*   < > 70* 66* 74*  --  88* 105* 56*  CREATININE 6.49*   < > 9.15* 8.97* 9.84*  --  11.05* 12.47* 8.53*  CALCIUM 8.2*   < > 8.8* 9.1 9.3  --  9.1 9.0 9.1  MG 2.1  --   --   --   --  1.8  --   --   --   PHOS  --    < > 5.8* 5.3*  --   --  7.1* 7.7* 6.0*   < > = values in this interval not displayed.   Liver Function Tests: Recent Labs  Lab 04/20/22 2027 04/21/22 0636 04/21/22 2200 04/23/22 0951 04/23/22 2054 04/24/22 1311 04/25/22 0837 04/26/22 0336  AST 14* 16  --   --  15  --   --   --   ALT 14 12  --   --  13  --   --   --   ALKPHOS 98 90  --   --  82  --   --   --   BILITOT 0.5 0.7  --   --  1.0  --   --   --   PROT 7.3 6.8  --   --  6.4*  --   --   --   ALBUMIN 3.5 3.2*   < > 3.2* 2.9* 3.0* 2.8* 2.6*   < > = values  in this interval not displayed.   No results for input(s): "LIPASE", "AMYLASE" in the last 168 hours. Recent Labs  Lab 04/23/22 1415  AMMONIA 30   CBC: Recent Labs  Lab 04/20/22 2335 04/21/22 0636 04/21/22 2200 04/22/22 1200 04/23/22 0951 04/24/22 1311 04/25/22 0837  WBC 7.6 5.8 7.6 7.3  8.6 8.0 7.8  NEUTROABS 5.4 4.0  --   --   --   --   --   HGB 6.7* 6.7* 10.2* 9.4* 9.7* 9.6* 9.6*  HCT 22.0* 21.4* 30.5* 27.9* 29.5* 28.4* 28.1*  MCV 122.9* 121.6* 102.0* 102.6* 105.0* 101.4* 101.8*  PLT 243 237 177 243 280 172 168   Cardiac Enzymes: No results for input(s): "CKTOTAL", "CKMB", "CKMBINDEX", "TROPONINI" in the last 168 hours. BNP: BNP (last 3 results) Recent Labs    10/17/21 1746 10/29/21 1631 11/08/21 1800  BNP 2,622.0* 1,743.6* 1,391.8*    ProBNP (last 3 results) No results for input(s): "PROBNP" in the last 8760 hours.  CBG: Recent Labs  Lab 04/20/22 1826  GLUCAP 95       Signed:  Nita Sells MD   [facility] 04/26/2022, 12:23 PM

## 2022-04-26 NOTE — Progress Notes (Signed)
Interventional Radiology Brief Note:  Labs reviewed by Dr. Moshe Cipro this AM.  Dialysis effective without difficulty cannulating AVG yesterday.  No current need for declotting procedure.  Order canceled.   Brynda Greathouse, MS RD PA-C

## 2022-04-26 NOTE — Progress Notes (Addendum)
Neurology Progress Note  Brief HPI: With a history of end-stage renal disease on dialysis, CHF, CAD with CABG, A-fib not on anticoagulation, hypertension, anxiety and depression presented originally with symptomatic anemia with hemoglobin of 6.6 and was transfused with packed red blood cells.  Patient's hemodialysis session on Friday was truncated due to difficulties with access.  Patient now presents with new onset of segmental myoclonus in the face and bilateral arms.    Subjective: No family at the bedside.  Patient is awake and alert watching TV. She is very pleasant today. She is alert and oriented x3, moves all extremities equally and spontaneously. She state she has not had any twitching. No new neurological events overnight.   Exam: Vitals:   04/26/22 0424 04/26/22 0827  BP: (!) 171/64 (!) 164/63  Pulse: (!) 59 60  Resp: 18 16  Temp: 98.1 F (36.7 C) 97.7 F (36.5 C)  SpO2: 100% 98%   Gen: elderly female laying in bed, in NAD Resp: non-labored breathing, no acute distress  Neuro: Mental Status: Awake and alert x 3. Able to name objects and repeat. Follows commands  Cranial Nerves: Pupils equal round reactive to light, extraocular movements intact, facial sensation symmetric to light touch, facial movements symmetric, hearing intact to voice, phonation is normal, and tongue is midline Motor: Moves All extremities to command.  Good antigravity strength in all 4 extremities.  Sensory: Reacts equally to light touch in all extremities Coordination: intact Gait: Deferred  Pertinent Labs:    Latest Ref Rng & Units 04/25/2022    8:37 AM 04/24/2022    1:11 PM 04/23/2022    9:51 AM  CBC  WBC 4.0 - 10.5 K/uL 7.8  8.0  8.6   Hemoglobin 12.0 - 15.0 g/dL 9.6  9.6  9.7   Hematocrit 36.0 - 46.0 % 28.1  28.4  29.5   Platelets 150 - 400 K/uL 168  172  280        Latest Ref Rng & Units 04/26/2022    3:36 AM 04/25/2022    8:37 AM 04/24/2022    1:11 PM  BMP  Glucose 70 - 99 mg/dL  81  102  85   BUN 8 - 23 mg/dL 56  105  88   Creatinine 0.44 - 1.00 mg/dL 8.53  12.47  11.05   Sodium 135 - 145 mmol/L 138  135  133   Potassium 3.5 - 5.1 mmol/L 4.1  5.1  5.6   Chloride 98 - 111 mmol/L 97  94  93   CO2 22 - 32 mmol/L '25  22  20   '$ Calcium 8.9 - 10.3 mg/dL 9.1  9.0  9.1      Imaging Reviewed:  MRI brain: No acute abnormality.  Small vessel disease and volume loss, motion degraded  MRI C-spine reviewed, no acute abnormality, though again motion degraded  Assessment: 81 year old patient with history of end-stage renal disease on dialysis, CHF, CAD with CABG, A-fib not on anticoagulation rate controlled with metoprolol and amiodarone, hypertension, anxiety, and depression presented initially with anemia and now has myoclonic movements of bilateral upper extremities and face.    Differential is broad, and may include electrolyte derangements, use of amiodarone, withdrawal from sedatives as patient's dose of Atarax was recently reduced.  Cerebral hypoxia secondary to anemia may have also played a role.     Recommendations: -Management of comorbidities per primary team -Neurology will sign off. Please call with questions or concerns  August Albino DNP, ACNPC-AG  [facility] See Amion for schedule and pager information 04/26/2022 9:25 AM

## 2022-04-26 NOTE — Progress Notes (Signed)
Mobility Specialist Progress Note:   04/26/22 1500  Mobility  Activity Ambulated with assistance to bathroom  Level of Assistance Minimal assist, patient does 75% or more  Assistive Device Front wheel walker  Distance Ambulated (ft) 30 ft  Activity Response Tolerated well  Mobility Referral Yes  $Mobility charge 1 Mobility   Pt requesting to go to BR. Required max verbal cues to initiate any movement. Pt left in BR after 15 min of waiting, NT aware.   Nelta Numbers Acute Rehab Secure Chat or Office Phone: [phone]

## 2022-04-26 NOTE — TOC Progression Note (Signed)
Transition of Care [facility]) - Initial/Assessment Note    Patient Details  Name: Shawna [name] MRN: [id number] Date of Birth: [birth year]  Transition of Care [facility]) CM/SW Contact:    Milinda Antis, [place] Phone Number: 04/26/2022, 10:31 AM  Clinical Narrative:                 LCSW contacted admissions at [facility] to informed the facility that the patient's expected discharge date has been moved.  There was no answer.  CSW left a secure VM.    Insurance auth for SNF is active from 04/22/2022 - 54/65/0354 , certification # [id number]  TOC will continue to follow.    Expected Discharge Plan: Skilled Nursing Facility Barriers to Discharge: Continued Medical Work up   Patient Goals and CMS Choice Patient states their goals for this hospitalization and ongoing recovery are:: go to rehab CMS Medicare.gov Compare Post Acute Care list provided to:: Patient Choice offered to / list presented to : Patient  Expected Discharge Plan and Services Expected Discharge Plan: [place] In-house Referral: Clinical Social Work   Post Acute Care Choice: [place] Living arrangements for the past 2 months: [place]                                      Prior Living Arrangements/Services Living arrangements for the past 2 months: Single Family Home Lives with:: Spouse Patient language and need for interpreter reviewed:: Yes Do you feel safe going back to the place where you live?: Yes      Need for Family Participation in Patient Care: Yes (Comment) Care giver support system in place?: Yes (comment) Current home services: DME Criminal Activity/Legal Involvement Pertinent to Current Situation/Hospitalization: No - Comment as needed  Activities of Daily Living Home Assistive Devices/Equipment: Wheelchair, Environmental consultant (specify type), Shower chair without back, Cane (specify quad or straight) ADL Screening (condition at time of admission) Patient's  cognitive ability adequate to safely complete daily activities?: Yes Is the patient deaf or have difficulty hearing?: No Does the patient have difficulty seeing, even when wearing glasses/contacts?: No Does the patient have difficulty concentrating, remembering, or making decisions?: No Patient able to express need for assistance with ADLs?: Yes Does the patient have difficulty dressing or bathing?: Yes Independently performs ADLs?: No Communication: Independent Dressing (OT): Needs assistance Is this a change from baseline?: Pre-admission baseline Grooming: Needs assistance Is this a change from baseline?: Pre-admission baseline Feeding: Independent Bathing: Needs assistance Is this a change from baseline?: Pre-admission baseline Toileting: Needs assistance Is this a change from baseline?: Pre-admission baseline In/Out Bed: Needs assistance Is this a change from baseline?: Pre-admission baseline Walks in Home: Dependent (All been in the last 3 days) Is this a change from baseline?: Change from baseline, expected to last >3 days Does the patient have difficulty walking or climbing stairs?: Yes Weakness of Legs: Both Weakness of Arms/Hands: None  Permission Sought/Granted Permission sought to share information with : Facility Art therapist granted to share information with : Yes, Verbal Permission Granted     Permission granted to share info w AGENCY: SNF        Emotional Assessment   Attitude/Demeanor/Rapport: Engaged Affect (typically observed): Pleasant Orientation: : Oriented to Self, Oriented to Place, Oriented to  Time, Oriented to Situation Alcohol / Substance Use: Not Applicable Psych Involvement: No (comment)  Admission diagnosis:  Generalized weakness [F74.1]  Symptomatic anemia [D64.9] Patient Active Problem List   Diagnosis Date Noted   Delirium 04/22/2022   History of CAD (coronary artery disease) 04/22/2022   NSTEMI, initial episode of  care ([place]) 10/19/2021   Symptomatic anemia 09/09/2021   Ischemic cardiomyopathy    Paroxysmal Atrial fibrillation/Flutter 05/07/2021   Neuropathy 04/23/2021   Anemia due to chronic blood loss 04/13/2021   Left knee pain 03/15/2021   Moderate protein-calorie malnutrition ([place]) 02/27/2021   COVID-19 virus infection 02/03/2021   Leukocytosis 02/03/2021   Hypothyroidism (acquired) 02/03/2021   Myositis 12/03/2020   Ataxia 12/02/2020   Diabetes mellitus type 2 in nonobese ([place]) 11/10/2020   Generalized weakness 11/09/2020   Dialysis AV fistula malfunction, initial encounter ([place])    Jugular vein occlusion, right (HCC)    Failure of surgically constructed arteriovenous fistula ([place]) 10/03/2020   Myoclonus 08/31/2020   Clotted renal dialysis AV graft, initial encounter ([place])    Hemodialysis-associated hypotension    Irritable bowel syndrome 02/25/2020   Adenomatous duodenal polyp 09/10/2019   History of GI bleed 09/10/2019   Hand steal syndrome ([place]) 08/01/2017   Coronary artery disease 00/76/2263   Complication of vascular access for dialysis 03/19/2017   Preoperative clearance 01/25/2017   H/O non-ST elevation myocardial infarction (NSTEMI) 10/24/2016   Non-ST elevation (NSTEMI) myocardial infarction [facility])    Cardiac arrest [facility])    Palliative care encounter    Goals of care, counseling/discussion    Flash pulmonary edema ([place]) 04/06/2016   History of colon cancer 01/27/2016   History of ovarian cancer 01/27/2016   PAF (paroxysmal atrial fibrillation) ([place]) 10/14/2015   Malignant neoplasm of right ovary ([place]) 10/14/2015   SVT (supraventricular tachycardia) 09/08/2015   Dyspnea    Occlusion and stenosis of carotid artery without mention of cerebral infarction 01/24/2013   Hx of CABG 07/05/2012   Carotid artery disease ([place]) 07/05/2012   Mitral regurgitation 06/12/2012   Non-STEMI (non-ST elevated myocardial infarction) ([place]) 06/08/2012   Chronic combined systolic and diastolic  CHF (congestive heart failure) ([place]) 05/02/2012   AVM (arteriovenous malformation) of small bowel, acquired 01/20/2012   GERD (gastroesophageal reflux disease) 01/09/2012   HLD (hyperlipidemia) 01/05/2012   Atherosclerotic heart disease of native coronary artery without angina pectoris 12/16/2011   Anxiety disorder 05/04/2011   ESRD (end stage renal disease) on dialysis ([place]) 04/29/2011   Gout 04/29/2011   PCP:  Practice, [name]:   [facility] [address], [place] [address] Phone: [phone] Fax: [phone]  [facility] (ESRD Bundle Only) - [place], [address] [place] [address] Phone: [phone] Fax: [phone]     Social Determinants of Health ([place]) Interventions    Readmission Risk Interventions    11/12/2021    8:56 AM 10/20/2021    4:38 PM 07/23/2021    4:26 PM  Readmission Risk Prevention Plan  Transportation Screening Complete Complete Complete  Medication Review ([place]) Complete Complete Complete  PCP or Specialist appointment within 3-5 days of discharge Complete Complete Complete  HRI or Home Care Consult Complete Complete Complete  SW Recovery Care/Counseling Consult Complete Complete Complete  Palliative Care Screening Not Applicable Not Applicable Not Applicable  Skilled Nursing Facility Complete Not Applicable Not Applicable

## 2022-04-26 NOTE — Progress Notes (Signed)
Mobility Specialist Progress Note:   04/26/22 1230  Mobility  Activity Ambulated with assistance in room  Level of Assistance Minimal assist, patient does 75% or more  Assistive Device Front wheel walker  Distance Ambulated (ft) 30 ft  Activity Response Tolerated well  Mobility Referral Yes  $Mobility charge 1 Mobility   Pt agreeable to mobility session. Required up to minA to steady at times. Pt back in chair with all needs met.   Nelta Numbers Acute Rehab Secure Chat or Office Phone: [phone]

## 2022-04-26 NOTE — TOC Transition Note (Addendum)
Transition of Care [facility]) - CM/SW Discharge Note   Patient Details  Name: Shawna [name] MRN: [id number] Date of Birth: [birth year]  Transition of Care [facility]) CM/SW Contact:  Ninfa Meeker, RN Phone Number: 04/26/2022, 2:09 PM   Clinical Narrative:    Case manager spoke with patient concerning discharge plan, she will go home with Home Health rather than to SNF. CM discussed [place] agency options, patient lives in [place], [place] and states she has used Training and development officer in the past. Tourist information centre manager contacted [place], spoke with Kaitlin 276- [phone],Faxed orders and dc summary, PT/OT eval to her at [phone]. Fara Chute will call back with determination.   2:39pm [place] will provide [facility] for patient per [place]. .   Final next level of care: [place] Barriers to Discharge: No Barriers Identified   Patient Goals and CMS Choice Patient states their goals for this hospitalization and ongoing recovery are:: go to rehab CMS Medicare.gov Compare Post Acute Care list provided to:: Patient Choice offered to / list presented to : Patient  Discharge Placement                       Discharge Plan and Services In-house Referral: Clinical Social Work Discharge Planning Services: CM Consult Post Acute Care Choice: Home Health                    HH Arranged: PT, OT [place] [facility] Agency: [place] Date [place]: 04/26/22 Time HH Agency Contacted: 1400 Representative spoke with at [place]: [place] ([place]) Interventions     Readmission Risk Interventions    11/12/2021    8:56 AM 10/20/2021    4:38 PM 07/23/2021    4:26 PM  Readmission Risk Prevention Plan  Transportation Screening Complete Complete Complete  Medication Review Press photographer) Complete Complete Complete  PCP or Specialist appointment within 3-5 days of discharge Complete Complete Complete  HRI or Home Care Consult Complete Complete  Complete  SW Recovery Care/Counseling Consult Complete Complete Complete  Palliative Care Screening Not Applicable Not Applicable Not [place] Complete Not Applicable Not Applicable

## 2022-04-26 NOTE — Care Management Important Message (Signed)
Important Message  Patient Details  Name: Shawna [name] MRN: [id number] Date of Birth: 21-Aug-1940   Medicare Important Message Given:  Yes     [name] [address] 04/26/2022, 3:59 PM

## 2022-04-26 NOTE — Progress Notes (Signed)
[facility] Progress Note   Subjective:   Patient seen and examined in room. Eating breakfast.  HD completed yesterday successfully using AVG, declot cancelled.  Twitching resolved.  Denies CP, SOB, abdominal pain and n/v/d.    Objective Vitals:   04/25/22 1640 04/25/22 2226 04/26/22 0424 04/26/22 0827  BP: (!) 171/54 (!) 158/65 (!) 171/64 (!) 164/63  Pulse: (!) 59 68 (!) 59 60  Resp: '12 18 18 16  '$ Temp: 97.8 F (36.6 C) 98 F (36.7 C) 98.1 F (36.7 C) 97.7 F (36.5 C)  TempSrc: Oral Oral Oral Oral  SpO2: 100% 100% 100% 98%  Weight:      Height:       Physical Exam General:chronically ill appearing, elderly female in NAD Heart:RRR, no mrg Lungs:CTAB, nml WOB on RA Abdomen:soft, NTND Extremities:no LE edema Dialysis Access: LU AVG +b/t   Filed Weights   04/23/22 1800 04/25/22 0820 04/25/22 1210  Weight: 60.8 kg 60.4 kg 58.2 kg    Intake/Output Summary (Last 24 hours) at 04/26/2022 1034 Last data filed at 04/26/2022 0800 Gross per 24 hour  Intake 0 ml  Output 2000 ml  Net -2000 ml    Additional Objective Labs: Basic Metabolic Panel: Recent Labs  Lab 04/24/22 1311 04/25/22 0837 04/26/22 0336  NA 133* 135 138  K 5.6* 5.1 4.1  CL 93* 94* 97*  CO2 20* 22 25  GLUCOSE 85 102* 81  BUN 88* 105* 56*  CREATININE 11.05* 12.47* 8.53*  CALCIUM 9.1 9.0 9.1  PHOS 7.1* 7.7* 6.0*   Liver Function Tests: Recent Labs  Lab 04/20/22 2027 04/21/22 0636 04/21/22 2200 04/23/22 2054 04/24/22 1311 04/25/22 0837 04/26/22 0336  AST 14* 16  --  15  --   --   --   ALT 14 12  --  13  --   --   --   ALKPHOS 98 90  --  82  --   --   --   BILITOT 0.5 0.7  --  1.0  --   --   --   PROT 7.3 6.8  --  6.4*  --   --   --   ALBUMIN 3.5 3.2*   < > 2.9* 3.0* 2.8* 2.6*   < > = values in this interval not displayed.    CBC: Recent Labs  Lab 04/20/22 2335 04/21/22 0636 04/21/22 2200 04/22/22 1200 04/23/22 0951 04/24/22 1311 04/25/22 0837  WBC 7.6 5.8 7.6 7.3  8.6 8.0 7.8  NEUTROABS 5.4 4.0  --   --   --   --   --   HGB 6.7* 6.7* 10.2* 9.4* 9.7* 9.6* 9.6*  HCT 22.0* 21.4* 30.5* 27.9* 29.5* 28.4* 28.1*  MCV 122.9* 121.6* 102.0* 102.6* 105.0* 101.4* 101.8*  PLT 243 237 177 243 280 172 168   CBG: Recent Labs  Lab 04/20/22 1826  GLUCAP 95   Medications:  anticoagulant sodium citrate      amiodarone  100 mg Oral Daily   calcitRIOL  2.25 mcg Oral Q M,W,F-HD   Chlorhexidine Gluconate Cloth  6 each Topical Q0600   cinacalcet  30 mg Oral Q M,W,F-HD   darbepoetin (ARANESP) injection - DIALYSIS  200 mcg Intravenous Q Fri-HD   levothyroxine  25 mcg Oral Daily   metoprolol tartrate  12.5 mg Oral BID   pantoprazole  40 mg Oral Daily   rosuvastatin  10 mg Oral Daily   sevelamer carbonate  2,400 mg Oral TID WC  Dialysis Orders: [facility], MWF.  3.5 hours.  300/500 flow rates.  2K/2.5 calcium.  EDW 59 kg.  Left upper extremity AVG, 15-gauge.  Meds: No heparin.  Mircera 200 mcg every 2 weeks (last dose 10/2), Venofer 50 mg q. weekly (last dose on 10/9), calcitriol 2.25 mcg 3 times weekly, Sensipar 30 mg 3 times weekly.   Assessment/Plan:   Myoclonus - Neuro consulted and MRI of brain and c-spine negative-  weaning off of keppra-  thinking med or metabolic impact. No neurological Sx. Neuro signed off.  ESRD - normally MWF.  Had shortened treatment on Friday due to poorly functioning AVG which has been a chronic problem.  Still has thrill and bruit.  Successful HD yesterday using AVG with improvement in labs.  HD tomorrow per regular schedule.  Hyperkalemia - resolved with lokelma and HD.  K 4.1 today.  HTN/volume - stable.  A little under edw post HD, likely needs to be lower on d/c. BP elevated.  Normally on lopressor and midodrine - midodrine stopped.  HR is 62 so will not uptitrate the lopressor-  may need another agent ?  Vascular access - worked well with HD yesterday.  Shuntogram cancelled.  Anemia of ESKD - Hgb 9.6. Continue with ESA and  follow-  200 ordered weekly  CKD-MBD - phos elevated, Ca borderline, cont with binders (renvela), vit d (rocaltrol), and sensipar.  Nutrition - Renal diet w/fluid restrictions. Alb 2.6, give protein supplements.   Jen Mow, PA-C [facility] 04/26/2022,10:34 AM  LOS: 3 days

## 2022-04-26 NOTE — Progress Notes (Addendum)
Shawna [name] to be discharged Home per MD order. Discussed prescriptions and follow up appointments with the patient and husband. Prescriptions given to patient; medication list explained in detail. Patient and husband verbalized understanding.  Skin clean, dry and intact without evidence of skin break down, no evidence of skin tears noted. IV catheter discontinued intact. Site without signs and symptoms of complications. Dressing and pressure applied. Pt denies pain at the site currently. No complaints noted.  Patient free of lines, drains, and wounds.   An After Visit Summary (AVS) was printed and given to the patient. Patient escorted via wheelchair, and discharged home via private auto.  Amaryllis Dyke, RN

## 2022-04-26 NOTE — Progress Notes (Signed)
   04/26/22 1625  Clinical Encounter Type  Visited With Patient and family together  Visit Type Initial;Other (Comment) (Advanced Directive)  Referral From Nurse  Consult/Referral To Chaplain   Chaplain responded to a spiritual consult for advanced directive education.  I went over the paperwork with the patient, Shawna [name], and her daughters. I encouraged them that if there were more questions as they worked through the paperwork to let the nurse know and a chaplain would return.   Danice Goltz [facility]  [phone]

## 2022-04-27 LAB — T3: T3, Total: 66 ng/dL — ABNORMAL LOW (ref 71–180)

## 2022-05-09 ENCOUNTER — Encounter (INDEPENDENT_AMBULATORY_CARE_PROVIDER_SITE_OTHER): Payer: Self-pay

## 2022-06-06 ENCOUNTER — Other Ambulatory Visit: Payer: Self-pay | Admitting: Student

## 2022-06-10 ENCOUNTER — Other Ambulatory Visit: Payer: Self-pay

## 2022-06-10 ENCOUNTER — Encounter (HOSPITAL_COMMUNITY): Payer: Self-pay

## 2022-06-10 ENCOUNTER — Observation Stay (HOSPITAL_COMMUNITY)
Admission: EM | Admit: 2022-06-10 | Discharge: 2022-06-12 | Disposition: A | Discharge status: Home / Self Care | Payer: Medicare HMO | Attending: Internal Medicine | Admitting: Internal Medicine

## 2022-06-10 DIAGNOSIS — M6281 Muscle weakness (generalized): Secondary | ICD-10-CM | POA: Insufficient documentation

## 2022-06-10 DIAGNOSIS — Z8543 Personal history of malignant neoplasm of ovary: Secondary | ICD-10-CM | POA: Insufficient documentation

## 2022-06-10 DIAGNOSIS — R2689 Other abnormalities of gait and mobility: Secondary | ICD-10-CM | POA: Diagnosis not present

## 2022-06-10 DIAGNOSIS — I779 Disorder of arteries and arterioles, unspecified: Secondary | ICD-10-CM | POA: Diagnosis present

## 2022-06-10 DIAGNOSIS — Z7982 Long term (current) use of aspirin: Secondary | ICD-10-CM | POA: Insufficient documentation

## 2022-06-10 DIAGNOSIS — Z951 Presence of aortocoronary bypass graft: Secondary | ICD-10-CM | POA: Diagnosis not present

## 2022-06-10 DIAGNOSIS — D72829 Elevated white blood cell count, unspecified: Secondary | ICD-10-CM | POA: Diagnosis not present

## 2022-06-10 DIAGNOSIS — I5032 Chronic diastolic (congestive) heart failure: Secondary | ICD-10-CM | POA: Diagnosis not present

## 2022-06-10 DIAGNOSIS — Z955 Presence of coronary angioplasty implant and graft: Secondary | ICD-10-CM | POA: Insufficient documentation

## 2022-06-10 DIAGNOSIS — N186 End stage renal disease: Secondary | ICD-10-CM | POA: Diagnosis not present

## 2022-06-10 DIAGNOSIS — Z8673 Personal history of transient ischemic attack (TIA), and cerebral infarction without residual deficits: Secondary | ICD-10-CM | POA: Diagnosis not present

## 2022-06-10 DIAGNOSIS — I6523 Occlusion and stenosis of bilateral carotid arteries: Secondary | ICD-10-CM | POA: Diagnosis not present

## 2022-06-10 DIAGNOSIS — R4182 Altered mental status, unspecified: Secondary | ICD-10-CM | POA: Diagnosis present

## 2022-06-10 DIAGNOSIS — I132 Hypertensive heart and chronic kidney disease with heart failure and with stage 5 chronic kidney disease, or end stage renal disease: Secondary | ICD-10-CM | POA: Diagnosis not present

## 2022-06-10 DIAGNOSIS — D649 Anemia, unspecified: Secondary | ICD-10-CM | POA: Diagnosis not present

## 2022-06-10 DIAGNOSIS — T829XXA Unspecified complication of cardiac and vascular prosthetic device, implant and graft, initial encounter: Secondary | ICD-10-CM | POA: Diagnosis present

## 2022-06-10 DIAGNOSIS — K219 Gastro-esophageal reflux disease without esophagitis: Secondary | ICD-10-CM | POA: Diagnosis present

## 2022-06-10 DIAGNOSIS — Z79899 Other long term (current) drug therapy: Secondary | ICD-10-CM | POA: Diagnosis not present

## 2022-06-10 DIAGNOSIS — I2510 Atherosclerotic heart disease of native coronary artery without angina pectoris: Secondary | ICD-10-CM | POA: Insufficient documentation

## 2022-06-10 DIAGNOSIS — I480 Paroxysmal atrial fibrillation: Secondary | ICD-10-CM | POA: Diagnosis not present

## 2022-06-10 DIAGNOSIS — Z992 Dependence on renal dialysis: Secondary | ICD-10-CM | POA: Diagnosis not present

## 2022-06-10 DIAGNOSIS — D500 Iron deficiency anemia secondary to blood loss (chronic): Secondary | ICD-10-CM

## 2022-06-10 DIAGNOSIS — D62 Acute posthemorrhagic anemia: Secondary | ICD-10-CM | POA: Diagnosis present

## 2022-06-10 LAB — CBC WITH DIFFERENTIAL/PLATELET
Abs Immature Granulocytes: 0.12 10*3/uL — ABNORMAL HIGH (ref 0.00–0.07)
Basophils Absolute: 0.1 10*3/uL (ref 0.0–0.1)
Basophils Relative: 0 %
Eosinophils Absolute: 0.1 10*3/uL (ref 0.0–0.5)
Eosinophils Relative: 0 %
HCT: 19 % — ABNORMAL LOW (ref 36.0–46.0)
Hemoglobin: 5.8 g/dL — CL (ref 12.0–15.0)
Immature Granulocytes: 1 %
Lymphocytes Relative: 4 %
Lymphs Abs: 0.6 10*3/uL — ABNORMAL LOW (ref 0.7–4.0)
MCH: 35.2 pg — ABNORMAL HIGH (ref 26.0–34.0)
MCHC: 30.5 g/dL (ref 30.0–36.0)
MCV: 115.2 fL — ABNORMAL HIGH (ref 80.0–100.0)
Monocytes Absolute: 1.2 10*3/uL — ABNORMAL HIGH (ref 0.1–1.0)
Monocytes Relative: 8 %
Neutro Abs: 13.5 10*3/uL — ABNORMAL HIGH (ref 1.7–7.7)
Neutrophils Relative %: 87 %
Platelets: 267 10*3/uL (ref 150–400)
RBC: 1.65 MIL/uL — ABNORMAL LOW (ref 3.87–5.11)
RDW: 23.5 % — ABNORMAL HIGH (ref 11.5–15.5)
WBC: 15.5 10*3/uL — ABNORMAL HIGH (ref 4.0–10.5)
nRBC: 0.3 % — ABNORMAL HIGH (ref 0.0–0.2)

## 2022-06-10 LAB — COMPREHENSIVE METABOLIC PANEL
ALT: 18 U/L (ref 0–44)
AST: 14 U/L — ABNORMAL LOW (ref 15–41)
Albumin: 3.3 g/dL — ABNORMAL LOW (ref 3.5–5.0)
Alkaline Phosphatase: 101 U/L (ref 38–126)
Anion gap: 17 — ABNORMAL HIGH (ref 5–15)
BUN: 64 mg/dL — ABNORMAL HIGH (ref 8–23)
CO2: 20 mmol/L — ABNORMAL LOW (ref 22–32)
Calcium: 8.7 mg/dL — ABNORMAL LOW (ref 8.9–10.3)
Chloride: 101 mmol/L (ref 98–111)
Creatinine, Ser: 8.1 mg/dL — ABNORMAL HIGH (ref 0.44–1.00)
GFR, Estimated: 5 mL/min — ABNORMAL LOW (ref >60)
Glucose, Bld: 147 mg/dL — ABNORMAL HIGH (ref 70–99)
Potassium: 3.8 mmol/L (ref 3.5–5.1)
Sodium: 138 mmol/L (ref 135–145)
Total Bilirubin: 0.8 mg/dL (ref 0.3–1.2)
Total Protein: 6.5 g/dL (ref 6.5–8.1)

## 2022-06-10 LAB — PREPARE RBC (CROSSMATCH)

## 2022-06-10 MED ORDER — RENA-VITE PO TABS
1.0000 | ORAL_TABLET | Freq: Every day | ORAL | Status: DC
  Administered 2022-06-11: 1 via ORAL
  Filled 2022-06-10: qty 1

## 2022-06-10 MED ORDER — SODIUM CHLORIDE 0.9% IV SOLUTION: Freq: Once | INTRAVENOUS | Status: DC

## 2022-06-10 MED ORDER — ASPIRIN 81 MG PO TBEC
DELAYED_RELEASE_TABLET | Freq: Every day | ORAL | Status: DC
  Administered 2022-06-10 – 2022-06-11 (×2): 81 mg via ORAL
  Filled 2022-06-10 (×2): qty 1

## 2022-06-10 MED ORDER — AMIODARONE HCL 200 MG PO TABS
100.0000 mg | ORAL_TABLET | Freq: Every day | ORAL | Status: DC
  Administered 2022-06-11 – 2022-06-12 (×2): 100 mg via ORAL
  Filled 2022-06-10 (×2): qty 1

## 2022-06-10 MED ORDER — SEVELAMER CARBONATE 800 MG PO TABS
2400.0000 mg | ORAL_TABLET | Freq: Three times a day (TID) | ORAL | Status: DC
  Administered 2022-06-10 – 2022-06-12 (×4): 2400 mg via ORAL
  Filled 2022-06-10 (×5): qty 3

## 2022-06-10 MED ORDER — CYCLOBENZAPRINE HCL 10 MG PO TABS: 5.0000 mg | ORAL_TABLET | Freq: Three times a day (TID) | ORAL | Status: DC | PRN

## 2022-06-10 MED ORDER — PANTOPRAZOLE SODIUM 40 MG PO TBEC
80.0000 mg | DELAYED_RELEASE_TABLET | Freq: Every day | ORAL | Status: DC
  Administered 2022-06-10 – 2022-06-12 (×3): 80 mg via ORAL
  Filled 2022-06-10 (×3): qty 2

## 2022-06-10 MED ORDER — ONDANSETRON HCL 4 MG/2ML IJ SOLN: 4.0000 mg | Freq: Four times a day (QID) | INTRAMUSCULAR | Status: DC | PRN

## 2022-06-10 MED ORDER — LEVOTHYROXINE SODIUM 50 MCG PO TABS
50.0000 ug | ORAL_TABLET | Freq: Every day | ORAL | Status: DC
  Administered 2022-06-11 – 2022-06-12 (×2): 50 ug via ORAL
  Filled 2022-06-10 (×2): qty 1

## 2022-06-10 MED ORDER — ONDANSETRON HCL 4 MG PO TABS: 4.0000 mg | ORAL_TABLET | Freq: Four times a day (QID) | ORAL | Status: DC | PRN

## 2022-06-10 MED ORDER — ROSUVASTATIN CALCIUM 20 MG PO TABS
10.0000 mg | ORAL_TABLET | Freq: Every day | ORAL | Status: DC
  Administered 2022-06-11 – 2022-06-12 (×2): 10 mg via ORAL
  Filled 2022-06-10 (×2): qty 1

## 2022-06-10 MED ORDER — METOPROLOL TARTRATE 25 MG PO TABS
12.5000 mg | ORAL_TABLET | Freq: Two times a day (BID) | ORAL | Status: DC
  Administered 2022-06-10 – 2022-06-12 (×4): 12.5 mg via ORAL
  Filled 2022-06-10 (×4): qty 1

## 2022-06-10 MED ORDER — CHLORHEXIDINE GLUCONATE CLOTH 2 % EX PADS
6.0000 | MEDICATED_PAD | Freq: Every day | CUTANEOUS | Status: DC
  Administered 2022-06-11 – 2022-06-12 (×2): 6 via TOPICAL

## 2022-06-10 MED ORDER — FOLIC ACID 1 MG PO TABS
1.0000 mg | ORAL_TABLET | Freq: Every day | ORAL | Status: DC
  Administered 2022-06-11 – 2022-06-12 (×2): 1 mg via ORAL
  Filled 2022-06-10 (×2): qty 1

## 2022-06-10 NOTE — H&P (Signed)
History and Physical    Patient: Shawna [name] PTW:[id number] DOB: [birth year] DOA: 06/10/2022 DOS: the patient was seen and examined on 06/10/2022 PCP: [facility], [facility]  Patient coming from: Home  Chief Complaint:  Chief Complaint  Patient presents with   Altered Mental Status   HPI: Shawna [name] is a 81 y.o. female with medical history significant of coronary artery disease with history of CABG and stents, carotid artery disease, heart failure with preserved EF, atrial fibrillation, end-stage renal disease on dialysis Monday Wednesday Friday secondary to hypertension, hyperlipidemia.  Patient seen for weakness and mild confusion that started this morning.  By patient report, the fistula graft in her left arm has clotted.  She was undergoing a procedure yesterday to try to break down the clot and that graft.  Unfortunately this was unsuccessful.  Later that day, it was noted that the graft was bleeding and the patient went to [facility] in order to address that.  3 stitches were placed and the patient was sent home.  It does not appear that labs were checked yesterday.  Both she and her husband report that she lost a lot of blood between being at the hospital and at home.  This morning, the patient woke up and was experiencing a lot of weakness and fatigue. She did go to dialysis today but was only for a short period of time due to her blood pressure being low.   Review of Systems: As mentioned in the history of present illness. All other systems reviewed and are negative. Past Medical History:  Diagnosis Date   Acute on chronic respiratory failure with hypoxia ([place]) 10/10/2016   Anxiety    Arthritis    AVM (arteriovenous malformation) of colon    CAD (coronary artery disease)    a. s/p CABG in 2013 b. DES to D1 in 10/2016. c. cath in 07/2018 showing patent grafts with occlusion of D1 at prior stent site and progression of PDA disease --> medical management recommended   Carotid  artery disease ([place])    a. 70-01% LICA, 01/4943    Chronic anemia    Chronic bronchitis (HCC)    Chronic diastolic CHF (congestive heart failure) ([place])    a. 02/2012 Echo EF 60-65%, nl wall motion, Gr 1 DD, mod MR   Colon cancer ([place]) 1992   Esophageal stricture    ESRD on hemodialysis ([place])    ESRD due to HTN, started dialysis 2011 and gets HD at [facility] with Dr Hinda Lenis on MWF schedule.  Access is LUA AVF as of Sept 2014.    GERD (gastroesophageal reflux disease)    High cholesterol 12/2011   History of blood transfusion 07/2011; 12/2011; 01/2012 X 2; 04/2012   History of gout    History of lower GI bleeding    Hypertension    Iron deficiency anemia    Jugular vein occlusion, right (HCC)    Mitral regurgitation    a. Moderate by echo, 02/2012   Myocardial infarction [facility])    NSVT (nonsustained ventricular tachycardia) ([place])    Ovarian cancer ([place]) 1992   PAF (paroxysmal atrial fibrillation) ([place])    Pneumonia ~ 2009   PUD (peptic ulcer disease)    TIA (transient ischemic attack)    Past Surgical History:  Procedure Laterality Date   A/V SHUNTOGRAM Left 03/19/2019   Procedure: A/V SHUNTOGRAM;  Surgeon: Katha Cabal, MD;  Location: [place] CV LAB;  Service: Cardiovascular;  Laterality: Left;   ABDOMINAL  HYSTERECTOMY  1992   APPENDECTOMY  06/1990   AV FISTULA PLACEMENT  07/2009   left upper arm   AV FISTULA PLACEMENT Right 09/06/2016   Procedure: RIGHT FOREARM ARTERIOVENOUS (AV) GRAFT;  Surgeon: Elam Dutch, MD;  Location: [place];  Service: Vascular;  Laterality: Right;   AV FISTULA PLACEMENT N/A 02/24/2017   Procedure: INSERTION OF ARTERIOVENOUS (AV) GORE-TEX GRAFT ARM (BRACHIAL AXILLARY);  Surgeon: Katha Cabal, MD;  Location: [facility] ORS;  Service: Vascular;  Laterality: N/A;   [place] Right 09/06/2016   Procedure: REMOVAL OF Right Arm ARTERIOVENOUS GORETEX GRAFT and Vein Patch angioplasty of brachial artery;  Surgeon: Angelia Mould, MD;  Location: [place];  Service: Vascular;  Laterality: Right;   BIOPSY  09/26/2019   Procedure: BIOPSY;  Surgeon: Rogene Houston, MD;  [facility];  Service: Endoscopy;;   COLON RESECTION  1992   COLON SURGERY     COLONOSCOPY N/A 03/09/2019   Procedure: COLONOSCOPY;  Surgeon: Rogene Houston, MD;  [facility];  Service: Endoscopy;  Laterality: N/A;   COLONOSCOPY WITH PROPOFOL N/A 06/08/2021   Procedure: COLONOSCOPY WITH PROPOFOL;  Surgeon: Harvel Quale, MD;  [facility];  Service: Gastroenterology;  Laterality: N/A;  9:05 /Patient is on dialysis Mon Wed Fri   CORONARY ANGIOPLASTY WITH STENT PLACEMENT  12/15/11   "2"   CORONARY ANGIOPLASTY WITH STENT PLACEMENT  y/2013   "1; makes total of 3" (05/02/2012)   CORONARY ARTERY BYPASS GRAFT  06/13/2012   Procedure: CORONARY ARTERY BYPASS GRAFTING (CABG);  Surgeon: Grace Isaac, MD;  Location: [place];  Service: Open Heart Surgery;  Laterality: N/A;  cabg x four;  using left internal mammary artery, and left leg greater saphenous vein harvested endoscopically   CORONARY STENT INTERVENTION N/A 10/13/2016   Procedure: Coronary Stent Intervention;  Surgeon: Troy Sine, MD;  Location: [place] CV LAB;  Service: Cardiovascular;  Laterality: N/A;   DIALYSIS/PERMA CATHETER REMOVAL N/A 04/18/2017   Procedure: DIALYSIS/PERMA CATHETER REMOVAL;  Surgeon: Katha Cabal, MD;  Location: [place] CV LAB;  Service: Cardiovascular;  Laterality: N/A;   DILATION AND CURETTAGE OF UTERUS     ENTEROSCOPY N/A 06/08/2021   Procedure: PUSH ENTEROSCOPY;  Surgeon: Harvel Quale, MD;  [facility];  Service: Gastroenterology;  Laterality: N/A;   ESOPHAGOGASTRODUODENOSCOPY  01/20/2012   Procedure: ESOPHAGOGASTRODUODENOSCOPY (EGD);  Surgeon: Ladene Artist, MD,FACG;  Location: [facility] ENDOSCOPY;  Service: Endoscopy;  Laterality: N/A;   ESOPHAGOGASTRODUODENOSCOPY N/A 03/26/2013   Procedure: ESOPHAGOGASTRODUODENOSCOPY  (EGD);  Surgeon: Irene Shipper, MD;  Location: [facility] ENDOSCOPY;  Service: Endoscopy;  Laterality: N/A;   ESOPHAGOGASTRODUODENOSCOPY N/A 04/30/2015   Procedure: ESOPHAGOGASTRODUODENOSCOPY (EGD);  Surgeon: Rogene Houston, MD;  [facility];  Service: Endoscopy;  Laterality: N/A;  1pm - moved to 10/20 @ 1:10   ESOPHAGOGASTRODUODENOSCOPY N/A 07/29/2016   Procedure: ESOPHAGOGASTRODUODENOSCOPY (EGD);  Surgeon: Manus Gunning, MD;  Location: [place];  Service: Gastroenterology;  Laterality: N/A;  enteroscopy   ESOPHAGOGASTRODUODENOSCOPY N/A 09/26/2019   Procedure: ESOPHAGOGASTRODUODENOSCOPY (EGD);  Surgeon: Rogene Houston, MD;  [facility];  Service: Endoscopy;  Laterality: N/A;  1250   ESOPHAGOGASTRODUODENOSCOPY (EGD) WITH PROPOFOL N/A 02/05/2021   Procedure: ESOPHAGOGASTRODUODENOSCOPY (EGD) WITH PROPOFOL;  Surgeon: Eloise Harman, DO;  [facility];  Service: Endoscopy;  Laterality: N/A;   GIVENS CAPSULE STUDY N/A 03/07/2019   Procedure: GIVENS CAPSULE STUDY;  Surgeon: Rogene Houston,  MD;  [facility];  Service: Endoscopy;  Laterality: N/A;  7:30   GIVENS CAPSULE STUDY N/A 04/22/2021   Procedure: GIVENS CAPSULE STUDY;  Surgeon: Rogene Houston, MD;  [facility];  Service: Endoscopy;  Laterality: N/A;  7:30   INSERTION OF DIALYSIS CATHETER N/A 10/05/2020   Procedure: ABORTED TUNNELED DIALYSIS CATHETER PLACEMENT RIGHT INTERNAL JUGULAR VEIN ;  Surgeon: Virl Cagey, MD;  [facility];  Service: General;  Laterality: N/A;   INTRAOPERATIVE TRANSESOPHAGEAL ECHOCARDIOGRAM  06/13/2012   Procedure: INTRAOPERATIVE TRANSESOPHAGEAL ECHOCARDIOGRAM;  Surgeon: Grace Isaac, MD;  Location: [place];  Service: Open Heart Surgery;  Laterality: N/A;   IR DIALY SHUNT INTRO NEEDLE/INTRACATH INITIAL W/IMG LEFT Left 10/06/2020   IR FLUORO GUIDE CV LINE RIGHT  06/17/2020   IR GENERIC HISTORICAL  07/26/2016   IR FLUORO GUIDE CV LINE RIGHT 07/26/2016  Greggory Keen, MD [facility]   IR GENERIC HISTORICAL  07/26/2016   IR US GUIDE VASC ACCESS RIGHT 07/26/2016 Greggory Keen, MD [facility]   IR GENERIC HISTORICAL  08/02/2016   IR US GUIDE VASC ACCESS RIGHT 08/02/2016 Greggory Keen, MD [facility]   IR GENERIC HISTORICAL  08/02/2016   IR FLUORO GUIDE CV LINE RIGHT 08/02/2016 Greggory Keen, MD [facility]   IR RADIOLOGY PERIPHERAL GUIDED IV START  03/28/2017   IR REMOVAL TUN CV CATH W/O FL  08/11/2020   IR THROMBECTOMY AV FISTULA W/THROMBOLYSIS INC/SHUNT/IMG LEFT Left 06/17/2020   IR US GUIDE VASC ACCESS LEFT  06/17/2020   IR US GUIDE VASC ACCESS RIGHT  03/28/2017   IR US GUIDE VASC ACCESS RIGHT  06/17/2020   LEFT HEART CATH AND CORONARY ANGIOGRAPHY N/A 09/20/2016   Procedure: Left Heart Cath and Coronary Angiography;  Surgeon: [name], MD;  Location: [place] CV LAB;  Service: Cardiovascular;  Laterality: N/A;   LEFT HEART CATH AND CORS/GRAFTS ANGIOGRAPHY N/A 10/13/2016   Procedure: Left Heart Cath and Cors/Grafts Angiography;  Surgeon: Troy Sine, MD;  Location: [place] CV LAB;  Service: Cardiovascular;  Laterality: N/A;   LEFT HEART CATH AND CORS/GRAFTS ANGIOGRAPHY N/A 07/13/2018   Procedure: LEFT HEART CATH AND CORS/GRAFTS ANGIOGRAPHY;  Surgeon: Martinique, Peter M, MD;  Location: [place] CV LAB;  Service: Cardiovascular;  Laterality: N/A;   LEFT HEART CATH AND CORS/GRAFTS ANGIOGRAPHY N/A 07/22/2021   Procedure: LEFT HEART CATH AND CORS/GRAFTS ANGIOGRAPHY;  Surgeon: Lorretta Harp, MD;  Location: [place] CV LAB;  Service: Cardiovascular;  Laterality: N/A;   LEFT HEART CATHETERIZATION WITH CORONARY ANGIOGRAM N/A 12/15/2011   Procedure: LEFT HEART CATHETERIZATION WITH CORONARY ANGIOGRAM;  Surgeon: Burnell Blanks, MD;  Location: [facility] CATH LAB;  Service: Cardiovascular;  Laterality: N/A;   LEFT HEART CATHETERIZATION WITH CORONARY ANGIOGRAM N/A 01/10/2012   Procedure: LEFT HEART CATHETERIZATION WITH CORONARY ANGIOGRAM;  Surgeon:  Peter M Martinique, MD;  Location: [facility] CATH LAB;  Service: Cardiovascular;  Laterality: N/A;   LEFT HEART CATHETERIZATION WITH CORONARY ANGIOGRAM N/A 06/08/2012   Procedure: LEFT HEART CATHETERIZATION WITH CORONARY ANGIOGRAM;  Surgeon: Burnell Blanks, MD;  Location: [facility] CATH LAB;  Service: Cardiovascular;  Laterality: N/A;   LEFT HEART CATHETERIZATION WITH CORONARY/GRAFT ANGIOGRAM N/A 12/10/2013   Procedure: LEFT HEART CATHETERIZATION WITH Beatrix Fetters;  Surgeon: [name], MD;  Location: [facility] CATH LAB;  Service: Cardiovascular;  Laterality: N/A;   OVARY SURGERY     ovarian cancer   POLYPECTOMY  03/09/2019   Procedure: POLYPECTOMY;  Surgeon: Rogene Houston,  MD;  [facility];  Service: Endoscopy;;  cecal    POLYPECTOMY N/A 09/26/2019   Procedure: DUODENAL POLYPECTOMY;  Surgeon: Rogene Houston, MD;  [facility];  Service: Endoscopy;  Laterality: N/A;   REVISION OF ARTERIOVENOUS GORETEX GRAFT N/A 02/24/2017   Procedure: REVISION OF ARTERIOVENOUS GORETEX GRAFT (RESECTION);  Surgeon: Katha Cabal, MD;  Location: [facility] ORS;  Service: Vascular;  Laterality: N/A;   REVISON OF ARTERIOVENOUS FISTULA Left 06/19/2020   Procedure: REVISION OF LEFT UPPER ARM AV GRAFT WITH INTERPOSITION JUMP GRAFT USING 6MM GORE LIMB;  Surgeon: Marty Heck, MD;  Location: [place];  Service: Vascular;  Laterality: Left;   SHUNTOGRAM N/A 10/15/2013   Procedure: Fistulogram;  Surgeon: Serafina Mitchell, MD;  Location: [facility] CATH LAB;  Service: Cardiovascular;  Laterality: N/A;   THROMBECTOMY / ARTERIOVENOUS GRAFT REVISION  2011   left upper arm   TUBAL LIGATION  1980's   UPPER EXTREMITY ANGIOGRAPHY Bilateral 12/06/2016   Procedure: Upper Extremity Angiography;  Surgeon: Katha Cabal, MD;  Location: [place] CV LAB;  Service: Cardiovascular;  Laterality: Bilateral;   UPPER EXTREMITY INTERVENTION Left 06/06/2017   Procedure: UPPER EXTREMITY INTERVENTION;  Surgeon:  Katha Cabal, MD;  Location: [place] CV LAB;  Service: Cardiovascular;  Laterality: Left;   Social History:  reports that she has never smoked. She has never used smokeless tobacco. She reports that she does not drink alcohol and does not use drugs.  Allergies  Allergen Reactions   Amlodipine Swelling   Aspirin Other (See Comments)    High Doses Mess up her stomach; "makes my bowels have blood in them". Takes 81 mg EC Aspirin    Nitrofurantoin Hives   Bactrim [Sulfamethoxazole-Trimethoprim] Rash   Contrast Media [Iodinated Contrast Media] Itching   Iron Itching and Other (See Comments)    "they gave me iron in dialysis; had to give me Benadryl cause I had to have the iron" (05/02/2012)   Tylenol [Acetaminophen] Itching and Other (See Comments)    Makes her feet on fire per pt   Gabapentin Other (See Comments)    Unknown reaction   Iron Sucrose Other (See Comments)    Unknown   Ranexa [Ranolazine] Other (See Comments)    Myoclonus-hospitalized    Sucroferric Oxyhydroxide Other (See Comments)    Unknown   Dexilant [Dexlansoprazole] Other (See Comments)    Upset stomach   Hydralazine Itching    Has tolerated while inpatient Has tolerated while inpatient   Levaquin [Levofloxacin In D5w] Rash   Morphine And Related Itching and Other (See Comments)    Itching in feet   Pantoprazole Rash   Plavix [Clopidogrel Bisulfate] Rash   Protonix [Pantoprazole Sodium] Rash   Venofer [Ferric Oxide] Itching and Other (See Comments)    Patient reports using Benadryl prior to doses as [place]    Family History  Problem Relation Age of Onset   Heart disease Mother        Heart Disease before age 66   Hyperlipidemia Mother    Hypertension Mother    Diabetes Mother    Heart attack Mother    Heart disease Father        Heart Disease before age 67   Hyperlipidemia Father    Hypertension Father    Diabetes Father    Diabetes Sister    Hypertension Sister    Diabetes  Brother    Hyperlipidemia Brother    Heart attack Brother  Hypertension Sister    Heart attack Brother    Colon cancer Child 35   Other Other        noncontributory for early CAD   Esophageal cancer Neg Hx    Liver disease Neg Hx    Kidney disease Neg Hx    Colon polyps Neg Hx     Prior to Admission medications   Medication Sig Start Date End Date Taking? Authorizing Provider  amiodarone (PACERONE) 100 MG tablet Take 1 tablet (100 mg total) by mouth daily. 04/27/22  Yes Nita Sells, MD  Aspirin 81 MG CAPS Take 1 capsule by mouth daily at 12 noon.   Yes [provider]  benzonatate (TESSALON) 100 MG capsule Take 100 mg by mouth 3 (three) times daily as needed for cough. 05/10/22  Yes [provider]  cyclobenzaprine (FLEXERIL) 5 MG tablet Take 5 mg by mouth 3 (three) times daily as needed for muscle spasms. 12/27/21  Yes [provider]  folic acid (FOLVITE) 1 MG tablet Take 1 mg by mouth daily. 05/24/22  Yes [provider]  hydrOXYzine (ATARAX) 25 MG tablet Take 25 mg by mouth every 6 (six) hours as needed for itching. 06/06/22  Yes [provider]  levothyroxine (SYNTHROID) 50 MCG tablet Take 50 mcg by mouth daily. 06/03/22  Yes [provider]  metoprolol tartrate (LOPRESSOR) 25 MG tablet Take 0.5 tablets (12.5 mg total) by mouth 2 (two) times daily. 03/04/22  Yes BranchAlphonse Guild, MD  multivitamin (RENA-VIT) TABS tablet Take 1 tablet by mouth daily.   Yes [provider]  nitroGLYCERIN (NITROSTAT) 0.4 MG SL tablet Place 0.4 mg under the tongue every 5 (five) minutes as needed for chest pain.   Yes [provider]  omeprazole (PRILOSEC) 20 MG capsule Take 1 capsule (20 mg total) by mouth daily. Patient taking differently: Take 40 mg by mouth daily as needed (acid reflux). 12/01/20  Yes Rehman, Mechele Dawley, MD  ondansetron (ZOFRAN-ODT) 4 MG disintegrating tablet Take 4 mg by mouth every 8 (eight) hours as  needed for nausea or vomiting. 05/05/22  Yes [provider]  rosuvastatin (CRESTOR) 10 MG tablet Take 1 tablet by mouth once daily 06/06/22  Yes Strader, [place], PA-C  sevelamer carbonate (RENVELA) 800 MG tablet Take 2,400 mg by mouth 3 (three) times daily. 12/27/21  Yes [provider]  levothyroxine (SYNTHROID) 25 MCG tablet Take 1 tablet (25 mcg total) by mouth daily. Patient not taking: Reported on 06/10/2022 02/02/21   Brita Romp, NP  predniSONE (DELTASONE) 20 MG tablet Take 20 mg by mouth See admin instructions. TAKE '40mg'$  (TWO TABLETS) BY MOUTH AT 7:00 PM AND 11:00 PM THE NIGHT PRIOR TO PROCEDURE AND THEN TAKE '40mg'$  (2 TABLETS) WITH 50 MG OF BENADRYL THE MORNING OF PROCEDURE 06/08/22   [provider]    Physical Exam: Vitals:   06/10/22 1400 06/10/22 1519 06/10/22 1530 06/10/22 1600  BP: 118/63 117/61 121/61 (!) 119/57  Pulse: (!) 102 (!) 118 (!) 118 (!) 114  Resp: 17 (!) '25 19 17  '$ Temp:      TempSrc:      SpO2: 98% 98% 100% 96%  Weight:      Height:       General: Elderly female. Awake and alert and oriented x3. No acute cardiopulmonary distress.  HEENT: Normocephalic atraumatic.  Right and left ears normal in appearance.  Pupils equal, round, reactive to light. Extraocular muscles are intact. Sclerae anicteric and noninjected.  Moist mucosal membranes. No mucosal lesions.  Neck: Neck supple without lymphadenopathy. No carotid bruits. No masses palpated.  Cardiovascular: Regular rate with normal S1-S2 sounds. No murmurs, rubs, gallops auscultated. No JVD.  Respiratory: Good respiratory effort with no wheezes, rales, rhonchi. Lungs clear to auscultation bilaterally.  No accessory muscle use. Abdomen: Soft, nontender, nondistended. Active bowel sounds. No masses or hepatosplenomegaly  Skin: Fistula graft in the left arm hemostatic with 3 stitches in place.  No rashes, lesions, or ulcerations.  Dry, warm to touch. 2+ dorsalis pedis and radial  pulses. Musculoskeletal: No calf or leg pain. All major joints not erythematous nontender.  No upper or lower joint deformation.  Good ROM.  No contractures  Psychiatric: Intact judgment and insight. Pleasant and cooperative. Neurologic: No focal neurological deficits. Strength is 5/5 and symmetric in upper and lower extremities.  Cranial nerves II through XII are grossly intact.  Data Reviewed: Results for orders placed or performed during the hospital encounter of 06/10/22 (from the past 24 hour(s))  CBC with Differential     Status: Abnormal   Collection Time: 06/10/22  2:31 PM  Result Value Ref Range   WBC 15.5 (H) 4.0 - 10.5 K/uL   RBC 1.65 (L) 3.87 - 5.11 MIL/uL   Hemoglobin 5.8 (LL) 12.0 - 15.0 g/dL   HCT 19.0 (L) 36.0 - 46.0 %   MCV 115.2 (H) 80.0 - 100.0 fL   MCH 35.2 (H) 26.0 - 34.0 pg   MCHC 30.5 30.0 - 36.0 g/dL   RDW 23.5 (H) 11.5 - 15.5 %   Platelets 267 150 - 400 K/uL   nRBC 0.3 (H) 0.0 - 0.2 %   Neutrophils Relative % 87 %   Neutro Abs 13.5 (H) 1.7 - 7.7 K/uL   Lymphocytes Relative 4 %   Lymphs Abs 0.6 (L) 0.7 - 4.0 K/uL   Monocytes Relative 8 %   Monocytes Absolute 1.2 (H) 0.1 - 1.0 K/uL   Eosinophils Relative 0 %   Eosinophils Absolute 0.1 0.0 - 0.5 K/uL   Basophils Relative 0 %   Basophils Absolute 0.1 0.0 - 0.1 K/uL   WBC Morphology MORPHOLOGY UNREMARKABLE    Smear Review PLATELET COUNT CONFIRMED BY SMEAR    Immature Granulocytes 1 %   Abs Immature Granulocytes 0.12 (H) 0.00 - 0.07 K/uL   Acanthocytes PRESENT    Polychromasia PRESENT    Spherocytes PRESENT    Ovalocytes PRESENT   Comprehensive metabolic panel     Status: Abnormal   Collection Time: 06/10/22  2:31 PM  Result Value Ref Range   Sodium 138 135 - 145 mmol/L   Potassium 3.8 3.5 - 5.1 mmol/L   Chloride 101 98 - 111 mmol/L   CO2 20 (L) 22 - 32 mmol/L   Glucose, Bld 147 (H) 70 - 99 mg/dL   BUN 64 (H) 8 - 23 mg/dL   Creatinine, Ser 8.10 (H) 0.44 - 1.00 mg/dL   Calcium 8.7 (L) 8.9 - 10.3 mg/dL    Total Protein 6.5 6.5 - 8.1 g/dL   Albumin 3.3 (L) 3.5 - 5.0 g/dL   AST 14 (L) 15 - 41 U/L   ALT 18 0 - 44 U/L   Alkaline Phosphatase 101 38 - 126 U/L   Total Bilirubin 0.8 0.3 - 1.2 mg/dL   GFR, Estimated 5 (L) >60 mL/min   Anion gap 17 (H) 5 - 15  Type and screen [facility]     Status: None   Collection Time:  06/10/22  3:41 PM  Result Value Ref Range   ABO/RH(D) O POS    Antibody Screen NEG    Sample Expiration      06/13/2022,2359 Performed at [facility], [address], [place], [place] [address]   Prepare RBC (crossmatch)     Status: None   Collection Time: 06/10/22  3:41 PM  Result Value Ref Range   Order Confirmation      ORDER PROCESSED BY BLOOD BANK Performed at [facility], [address], [place], [place] [address]    *Note: Due to a large number of results and/or encounters for the requested time period, some results have not been displayed. A complete set of results can be found in Results Review.   No results found.   Assessment and Plan: No notes have been filed under this hospital service. Service: Hospitalist  Active Problems:   GERD (gastroesophageal reflux disease)   Hx of CABG   Carotid artery disease (HCC)   ESRD (end stage renal disease) on dialysis ([place])   Complication of vascular access for dialysis   PAF (paroxysmal atrial fibrillation) (HCC)   Symptomatic anemia  Symptomatic anemia secondary to acute blood loss anemia Observation We will give patient 2 units of blood and check CBC in the morning Patient's blood pressure is fairly low so we will hold her night dose of metoprolol. Patient's tachycardia is likely secondary to anemia and should improve as her intravascular volume gets replaced Complication of vascular access Patient's vascular access appears hemostatic at this time End-stage renal disease on dialysis Nephrology consulted.  They will assess the patient tomorrow and likely dialyze Paroxysmal atrial  fibrillation Continue amiodarone Restart beta-blocker tomorrow Coronary artery disease with history of CABG and NSTEMI    Advance Care Planning:   Code Status: Prior full code  Consults: Nephrology  Family Communication: Husband present during interview and exam  Severity of Illness: The appropriate patient status for this patient is OBSERVATION. Observation status is judged to be reasonable and necessary in order to provide the required intensity of service to ensure the patient's safety. The patient's presenting symptoms, physical exam findings, and initial radiographic and laboratory data in the context of their medical condition is felt to place them at decreased risk for further clinical deterioration. Furthermore, it is anticipated that the patient will be medically stable for discharge from the hospital within 2 midnights of admission.   Author: Truett Mainland, DO 06/10/2022 5:55 PM  For on call review [email or web address].

## 2022-06-10 NOTE — ED Triage Notes (Signed)
Pt brought to ED via [facility] for lethargy. Pt was at dialysis, called EMS for lethargy. Was seen at [facility] for bleeding fistula. Dialysis today states Hgb low, didn't tell EMS value

## 2022-06-10 NOTE — ED Notes (Signed)
Patient placed on bedside commode and given call light to notify staff when done passing bowel movement. Nurse notified.

## 2022-06-10 NOTE — ED Provider Notes (Signed)
[facility] EMERGENCY DEPARTMENT Provider Note   CSN: [id number] Arrival date & time: 06/10/22  1335     History  Chief Complaint  Patient presents with   Altered Mental Status    Shawna [name] is a 81 y.o. female.  Patient is here in the emergency department complaining of weakness today.  Patient reports that she had dialysis.  Patient reports that she is very sleepy and very fatigued.  Patient reports that she had a lot of blood loss yesterday.  Patient was seen at [facility] for a bleeding dialysis graft. Pt had sutures placed to control bleeding  Patient's husband states that patient appeared to have lost a lot of blood.  Patient denies any weakness in her arms or legs she denies any headache she denies any facial droop.  Patient reports she feels weak overall.  Patient did have dialysis today.  Patient had dialysis using a vascular catheter that was placed yesterday in [place]   The history is provided by the patient. No language interpreter was used.  Altered Mental Status Severity:  Severe Most recent episode:  Today Timing:  Constant Progression:  Worsening Chronicity:  New Associated symptoms: weakness   Associated symptoms: no abdominal pain        Home Medications Prior to Admission medications   Medication Sig Start Date End Date Taking? Authorizing Provider  amiodarone (PACERONE) 100 MG tablet Take 1 tablet (100 mg total) by mouth daily. 04/27/22   Nita Sells, MD  Aspirin 81 MG CAPS Take 1 capsule by mouth daily at 12 noon.    [provider]  cyclobenzaprine (FLEXERIL) 5 MG tablet Take 5 mg by mouth 3 (three) times daily as needed. 12/27/21   [provider]  levothyroxine (SYNTHROID) 25 MCG tablet Take 1 tablet (25 mcg total) by mouth daily. 02/02/21   Brita Romp, NP  metoprolol tartrate (LOPRESSOR) 25 MG tablet Take 0.5 tablets (12.5 mg total) by mouth 2 (two) times daily. 03/04/22   Arnoldo Lenis, MD  multivitamin  (RENA-VIT) TABS tablet Take 1 tablet by mouth daily.    [provider]  nitroGLYCERIN (NITROSTAT) 0.4 MG SL tablet Place 0.4 mg under the tongue every 5 (five) minutes as needed for chest pain.    [provider]  omeprazole (PRILOSEC) 20 MG capsule Take 1 capsule (20 mg total) by mouth daily. Patient taking differently: Take 40 mg by mouth daily as needed (acid reflux). 12/01/20   Rogene Houston, MD  rosuvastatin (CRESTOR) 10 MG tablet Take 1 tablet by mouth once daily 06/06/22   Ahmed Prima, [place] M, PA-C  sevelamer carbonate (RENVELA) 800 MG tablet Take 2,400 mg by mouth 3 (three) times daily. 12/27/21   [provider]      Allergies    Amlodipine, Aspirin, Nitrofurantoin, Bactrim [sulfamethoxazole-trimethoprim], Contrast media [iodinated contrast media], Iron, Tylenol [acetaminophen], Gabapentin, Iron sucrose, Ranexa [ranolazine], Sucroferric oxyhydroxide, Dexilant [dexlansoprazole], Hydralazine, Levaquin [levofloxacin in d5w], Morphine and related, Pantoprazole, Plavix [clopidogrel bisulfate], Protonix [pantoprazole sodium], and Venofer [ferric oxide]    Review of Systems   Review of Systems  Gastrointestinal:  Negative for abdominal pain.  Neurological:  Positive for weakness.  All other systems reviewed and are negative.   Physical Exam Updated Vital Signs BP 130/60 (BP Location: Right Arm)   Pulse (!) 102   Temp (!) 97.5 F (36.4 C) (Oral)   Resp 19   Ht '5\' 2"'$  (1.575 m)   Wt 59 kg   SpO2 99%  BMI 23.78 kg/m  Physical Exam Vitals and nursing note reviewed.  Constitutional:      Appearance: She is well-developed.  HENT:     Head: Normocephalic.     Right Ear: Tympanic membrane normal.     Left Ear: Tympanic membrane normal.     Nose: Nose normal.     Mouth/Throat:     Mouth: Mucous membranes are moist.  Cardiovascular:     Rate and Rhythm: Normal rate.  Pulmonary:     Effort: Pulmonary effort is normal.  Abdominal:     General: Abdomen  is flat. There is no distension.  Musculoskeletal:        General: Normal range of motion.     Cervical back: Normal range of motion.  Skin:    General: Skin is warm.  Neurological:     General: No focal deficit present.     Mental Status: She is alert and oriented to person, place, and time.  Psychiatric:        Mood and Affect: Mood normal.     ED Results / Procedures / Treatments   Labs (all labs ordered are listed, but only abnormal results are displayed) Labs Reviewed  CBC WITH DIFFERENTIAL/PLATELET  COMPREHENSIVE METABOLIC PANEL    EKG None  Radiology No results found.  Procedures .Critical Care  Performed by: Fransico Meadow, PA-C Authorized by: Fransico Meadow, PA-C   Critical care provider statement:    Critical care time (minutes):  30   Critical care start time:  06/10/2022 3:00 AM   Critical care end time:  06/10/2022 4:05 PM   Critical care was time spent personally by me on the following activities:  Development of treatment plan with patient or surrogate, evaluation of patient's response to treatment, examination of patient, ordering and review of laboratory studies, ordering and review of radiographic studies, ordering and performing treatments and interventions, pulse oximetry, re-evaluation of patient's condition and review of old charts   Care discussed with: admitting provider   Comments:     Blood transfusion ordered      Medications Ordered in ED Medications - No data to display  ED Course/ Medical Decision Making/ A&P                           Medical Decision Making Patient here for generalized weakness.  Patient reports that she had bleeding from her dialysis graft yesterday.  Is been reports patient lost a lot of blood  Amount and/or Complexity of Data Reviewed Independent Historian: spouse    Details: Is here with her husband who is supportive External Data Reviewed: notes.    Details: Notes from [facility] are reviewed Labs:  ordered. Decision-making details documented in ED Course.    Details: Patient's hemoglobin is 5.8.  I ordered reviewed and interpreted patient's laboratory evaluation Discussion of management or test interpretation with external provider(s): Hospitalist consulted for admission  Risk Prescription drug management. Risk Details: I discussed patient's results with her.  She is agreeable to transfusion.           Final Clinical Impression(s) / ED Diagnoses Final diagnoses:  Anemia, unspecified type  Blood loss anemia    Rx / DC Orders ED Discharge Orders     None         Sidney Ace 06/10/22 1606    Milton Ferguson, MD 06/11/22 1008

## 2022-06-11 DIAGNOSIS — I480 Paroxysmal atrial fibrillation: Secondary | ICD-10-CM

## 2022-06-11 DIAGNOSIS — Z992 Dependence on renal dialysis: Secondary | ICD-10-CM

## 2022-06-11 DIAGNOSIS — D649 Anemia, unspecified: Secondary | ICD-10-CM

## 2022-06-11 DIAGNOSIS — N186 End stage renal disease: Secondary | ICD-10-CM

## 2022-06-11 DIAGNOSIS — T829XXS Unspecified complication of cardiac and vascular prosthetic device, implant and graft, sequela: Secondary | ICD-10-CM

## 2022-06-11 LAB — RENAL FUNCTION PANEL
Albumin: 2.7 g/dL — ABNORMAL LOW (ref 3.5–5.0)
Anion gap: 16 — ABNORMAL HIGH (ref 5–15)
BUN: 68 mg/dL — ABNORMAL HIGH (ref 8–23)
CO2: 18 mmol/L — ABNORMAL LOW (ref 22–32)
Calcium: 8.1 mg/dL — ABNORMAL LOW (ref 8.9–10.3)
Chloride: 101 mmol/L (ref 98–111)
Creatinine, Ser: 9.13 mg/dL — ABNORMAL HIGH (ref 0.44–1.00)
GFR, Estimated: 4 mL/min — ABNORMAL LOW (ref >60)
Glucose, Bld: 110 mg/dL — ABNORMAL HIGH (ref 70–99)
Phosphorus: 4.4 mg/dL (ref 2.5–4.6)
Potassium: 4.5 mmol/L (ref 3.5–5.1)
Sodium: 135 mmol/L (ref 135–145)

## 2022-06-11 LAB — HEPATITIS B SURFACE ANTIGEN: Hepatitis B Surface Ag: NONREACTIVE

## 2022-06-11 MED ORDER — HEPARIN SODIUM (PORCINE) 1000 UNIT/ML IJ SOLN
INTRAMUSCULAR | Status: AC
  Administered 2022-06-11: 3800 [IU]
  Filled 2022-06-11: qty 5

## 2022-06-11 MED ORDER — HEPARIN SODIUM (PORCINE) 1000 UNIT/ML DIALYSIS: 1000.0000 [IU] | INTRAMUSCULAR | Status: DC | PRN

## 2022-06-11 MED ORDER — ALTEPLASE 2 MG IJ SOLR: 2.0000 mg | Freq: Once | INTRAMUSCULAR | Status: DC | PRN

## 2022-06-11 NOTE — Progress Notes (Signed)
PROGRESS NOTE    Shawna [name]  RJJ:[id number] DOB: 11/17/1940 DOA: 06/10/2022 PCP: [facility]   Brief Narrative:  81 y.o. female with medical history significant of coronary artery disease with history of CABG and stents, carotid artery disease, heart failure with preserved EF, atrial fibrillation, end-stage renal disease on dialysis, hypertension, hyperlipidemia presented with altered mental status on 12/1/20223.  Patient apparently had clotting of her fistula/graft on 06/10/2022: Was undergoing a procedure to bring down the blood; unfortunately it was unsuccessful.  Subsequently, it was noted that the graft was bleeding: She went to [facility]: 3 stitches placed and patient was sent home.  Apparently there was a lot of blood loss during this time.  She presented to the ED for increasing weakness and fatigue.  Hemoglobin was 5.8 on presentation.  2 units packed red cell transfusion was ordered.  Nephrology was consulted.  Assessment & Plan:   Symptomatic anemia secondary to acute blood loss anemia in a patient with anemia of chronic disease from renal failure Bleeding from vascular dialysis access -Hemoglobin 5.8 on presentation.  Presented with bleeding from fistula/graft with acute blood loss despite stitches placed at [facility]. -Status post 2 units packed red cell transfusion with hemoglobin 7.4 this morning.  Monitor H&H in AM. -Discontinue aspirin  End-stage renal disease on hemodialysis Metabolic acidosis -Nephrology was consulted.  Dialysis as per nephrology schedule.  Paroxysmal A-fib -Not on anticoagulation as an outpatient.  Mild intermittent bradycardia present.  Continue amiodarone and metoprolol tartrate  CAD with history of CABG and non-STEMI Hyperlipidemia -DC aspirin.  Continue metoprolol tartrate and statin.  Outpatient follow-up with cardiology  Physical deconditioning -PT eval  Leukocytosis -Resolved  Goals of care -Care consultation for  goals of care.  Currently listed as full code.  DVT prophylaxis: SCDs Code Status: Full Family Communication: None at bedside Disposition Plan: Status is: Observation The patient will require care spanning > 2 midnights and should be moved to inpatient because: Of severity of illness.  Need for dialysis today.  Will need monitoring of H&H  Consultants: Nephrology  Procedures: None  Antimicrobials: None   Subjective: Patient seen and examined at bedside.  Feels slightly short of breath.  No fever, vomiting, chest pain reported.  Poor historian, slow to respond.  Objective: Vitals:   06/11/22 0002 06/11/22 0135 06/11/22 0150 06/11/22 0336  BP: (!) 134/40 (!) 128/44 (!) 128/41 (!) 128/54  Pulse: (!) 58 (!) 56 (!) 55 60  Resp: '18 18 18 18  '$ Temp: 98.6 F (37 C) 97.8 F (36.6 C) 97.8 F (36.6 C) 98 F (36.7 C)  TempSrc: Oral Oral Oral Oral  SpO2: 100% 100% 100% 100%  Weight:      Height:        Intake/Output Summary (Last 24 hours) at 06/11/2022 1330 Last data filed at 06/11/2022 0336 Gross per 24 hour  Intake 565 ml  Output 0 ml  Net 565 ml   Filed Weights   06/10/22 1340  Weight: 59 kg    Examination:  General exam: Appears calm and comfortable.  On room air.  Looks chronically ill and deconditioned. Respiratory system: Bilateral decreased breath sounds at bases with some scattered crackles Cardiovascular system: S1 & S2 heard, Rate controlled Gastrointestinal system: Abdomen is nondistended, soft and nontender. Normal bowel sounds heard. Extremities: No cyanosis, clubbing; trace lower extremity edema present.  Left elbow dressing present Central nervous system: Alert and oriented.  Slow to respond.  Poor historian.  No focal neurological  deficits. Moving extremities Skin: No rashes, lesions or ulcers Psychiatry: Flat affect.  Not agitated.    Data Reviewed: I have personally reviewed following labs and imaging studies  CBC: Recent Labs  Lab 06/10/22 1431  06/11/22 0719  WBC 15.5* 9.4  NEUTROABS 13.5*  --   HGB 5.8* 7.4*  HCT 19.0* 23.6*  MCV 115.2* 103.1*  PLT 267 250   Basic Metabolic Panel: Recent Labs  Lab 06/10/22 1431 06/11/22 0719  NA 138 135  K 3.8 4.5  CL 101 101  CO2 20* 18*  GLUCOSE 147* 110*  BUN 64* 68*  CREATININE 8.10* 9.13*  CALCIUM 8.7* 8.1*  PHOS  --  4.4   GFR: Estimated Creatinine Clearance: 3.8 mL/min (A) (by C-G formula based on SCr of 9.13 mg/dL (H)). Liver Function Tests: Recent Labs  Lab 06/10/22 1431 06/11/22 0719  AST 14*  --   ALT 18  --   ALKPHOS 101  --   BILITOT 0.8  --   PROT 6.5  --   ALBUMIN 3.3* 2.7*   No results for input(s): "LIPASE", "AMYLASE" in the last 168 hours. No results for input(s): "AMMONIA" in the last 168 hours. Coagulation Profile: No results for input(s): "INR", "PROTIME" in the last 168 hours. Cardiac Enzymes: No results for input(s): "CKTOTAL", "CKMB", "CKMBINDEX", "TROPONINI" in the last 168 hours. BNP (last 3 results) No results for input(s): "PROBNP" in the last 8760 hours. HbA1C: No results for input(s): "HGBA1C" in the last 72 hours. CBG: No results for input(s): "GLUCAP" in the last 168 hours. Lipid Profile: No results for input(s): "CHOL", "HDL", "LDLCALC", "TRIG", "CHOLHDL", "LDLDIRECT" in the last 72 hours. Thyroid Function Tests: No results for input(s): "TSH", "T4TOTAL", "FREET4", "T3FREE", "THYROIDAB" in the last 72 hours. Anemia Panel: No results for input(s): "VITAMINB12", "FOLATE", "FERRITIN", "TIBC", "IRON", "RETICCTPCT" in the last 72 hours. Sepsis Labs: No results for input(s): "PROCALCITON", "LATICACIDVEN" in the last 168 hours.  No results found for this or any previous visit (from the past 240 hour(s)).       Radiology Studies: No results found.      Scheduled Meds:  sodium chloride   Intravenous Once   amiodarone  100 mg Oral Daily   aspirin EC   Oral Q1200   Chlorhexidine Gluconate Cloth  6 each Topical I3704   folic  acid  1 mg Oral Daily   levothyroxine  50 mcg Oral Q0600   metoprolol tartrate  12.5 mg Oral BID   multivitamin  1 tablet Oral QHS   pantoprazole  80 mg Oral Daily   rosuvastatin  10 mg Oral Daily   sevelamer carbonate  2,400 mg Oral TID   Continuous Infusions:        Aline August, MD [facility] 06/11/2022, 1:30 PM

## 2022-06-11 NOTE — Plan of Care (Signed)

## 2022-06-11 NOTE — Care Management Obs Status (Signed)
[name] NOTIFICATION   Patient Details  Name: Shawna [name] MRN: [id number] Date of Birth: [birth year]   Medicare Observation Status Notification Given:  Yes    Iona Beard, [place] 06/11/2022, 1:18 PM

## 2022-06-11 NOTE — Procedures (Signed)
    HEMODIALYSIS TREATMENT NOTE:   Uneventful 3.25 hour heparin-free treatment completed using LIJ TDC.  Cath entry site is unremarkable. Goal met: 2.4 liters removed without interruption in UF.  One unit pRBC was transfused.  No meds ordered / given.  All blood was returned.  Hand-off given to Morton Peters, RN.   Rockwell Alexandria, RN

## 2022-06-11 NOTE — Plan of Care (Signed)
  Problem: Clinical Measurements: Goal: Will remain free from infection Outcome: Progressing   Problem: Clinical Measurements: Goal: Diagnostic test results will improve Outcome: Progressing   Problem: Activity: Goal: Risk for activity intolerance will decrease Outcome: Progressing   Problem: Nutrition: Goal: Adequate nutrition will be maintained Outcome: Progressing   Problem: Coping: Goal: Level of anxiety will decrease Outcome: Progressing   

## 2022-06-11 NOTE — Progress Notes (Signed)
  Transition of Care [facility]) Screening Note   Patient Details  Name: Shawna [name] Date of Birth: 05/17/1941   Transition of Care [facility]) CM/SW Contact:    Iona Beard, [place] Phone Number: 06/11/2022, 1:19 PM    Transition of Care Department [facility]) has reviewed patient and no TOC needs have been identified at this time. We will continue to monitor patient advancement through interdisciplinary progression rounds. If new patient transition needs arise, please place a TOC consult.

## 2022-06-12 DIAGNOSIS — N186 End stage renal disease: Secondary | ICD-10-CM | POA: Diagnosis not present

## 2022-06-12 DIAGNOSIS — Z992 Dependence on renal dialysis: Secondary | ICD-10-CM | POA: Diagnosis not present

## 2022-06-12 DIAGNOSIS — D649 Anemia, unspecified: Secondary | ICD-10-CM | POA: Diagnosis not present

## 2022-06-12 LAB — CBC WITH DIFFERENTIAL/PLATELET
Abs Immature Granulocytes: 0.05 10*3/uL (ref 0.00–0.07)
Basophils Absolute: 0.1 10*3/uL (ref 0.0–0.1)
Basophils Relative: 1 %
Eosinophils Absolute: 0.3 10*3/uL (ref 0.0–0.5)
Eosinophils Relative: 4 %
HCT: 28.1 % — ABNORMAL LOW (ref 36.0–46.0)
Hemoglobin: 9.4 g/dL — ABNORMAL LOW (ref 12.0–15.0)
Immature Granulocytes: 1 %
Lymphocytes Relative: 5 %
Lymphs Abs: 0.5 10*3/uL — ABNORMAL LOW (ref 0.7–4.0)
MCH: 32.4 pg (ref 26.0–34.0)
MCHC: 33.5 g/dL (ref 30.0–36.0)
MCV: 96.9 fL (ref 80.0–100.0)
Monocytes Absolute: 1.3 10*3/uL — ABNORMAL HIGH (ref 0.1–1.0)
Monocytes Relative: 14 %
Neutro Abs: 7.1 10*3/uL (ref 1.7–7.7)
Neutrophils Relative %: 75 %
Platelets: 200 10*3/uL (ref 150–400)
RBC: 2.9 MIL/uL — ABNORMAL LOW (ref 3.87–5.11)
RDW: 23.6 % — ABNORMAL HIGH (ref 11.5–15.5)
WBC: 9.3 10*3/uL (ref 4.0–10.5)
nRBC: 0.4 % — ABNORMAL HIGH (ref 0.0–0.2)

## 2022-06-12 LAB — HEPATITIS B SURFACE ANTIBODY, QUANTITATIVE: Hep B S AB Quant (Post): >1000 m[IU]/mL (ref >9.9)

## 2022-06-12 MED ORDER — LEVOTHYROXINE SODIUM 50 MCG PO TABS: 50.0000 ug | ORAL_TABLET | Freq: Every day | ORAL | Status: DC

## 2022-06-12 MED ORDER — OMEPRAZOLE 20 MG PO CPDR: 40.0000 mg | DELAYED_RELEASE_CAPSULE | Freq: Every day | ORAL | Status: DC | PRN

## 2022-06-12 NOTE — Plan of Care (Signed)
  Problem: Acute Rehab PT Goals(only PT should resolve) Goal: Pt Will Go Supine/Side To Sit Outcome: Progressing Flowsheets (Taken 06/12/2022 1354) Pt will go Supine/Side to Sit:  with supervision  with min guard assist Goal: Patient Will Transfer Sit To/From Stand Outcome: Progressing Flowsheets (Taken 06/12/2022 1354) Patient will transfer sit to/from stand:  with min guard assist  with supervision Goal: Pt Will Transfer Bed To Chair/Chair To Bed Outcome: Progressing Flowsheets (Taken 06/12/2022 1354) Pt will Transfer Bed to Chair/Chair to Bed:  with supervision  min guard assist Goal: Pt Will Ambulate Outcome: Progressing Flowsheets (Taken 06/12/2022 1354) Pt will Ambulate:  50 feet  with supervision  with min guard assist  with rolling walker   1:54 PM, 06/12/22 Lonell Grandchild, MPT Physical Therapist with [facility] [phone] office [phone] mobile phone

## 2022-06-12 NOTE — Discharge Summary (Signed)
Physician Discharge Summary  Shawna [name]:[id number] DOB: 1940/08/17 DOA: 06/10/2022  PCP: [facility]  Admit date: 06/10/2022 Discharge date: 06/12/2022  Admitted From: Home Disposition: Home  Recommendations for Outpatient Follow-up:  Follow up with PCP in 1 week with repeat CBC/BMP Outpatient follow-up with dialysis unit as scheduled Recommend outpatient evaluation and follow-up with palliative care Follow up in ED if symptoms worsen or new appear   Home Health: No Equipment/Devices: None  Discharge Condition: Guarded CODE STATUS: Full Diet recommendation: Heart healthy/renal hemodialysis diet.  Brief/Interim Summary: 81 y.o. female with medical history significant of coronary artery disease with history of CABG and stents, carotid artery disease, heart failure with preserved EF, atrial fibrillation, end-stage renal disease on dialysis, hypertension, hyperlipidemia presented with altered mental status on 12/1/20223.  Patient apparently had clotting of her fistula/graft on 06/10/2022: Was undergoing a procedure to bring down the blood; unfortunately it was unsuccessful.  Subsequently, it was noted that the graft was bleeding: She went to [facility]: 3 stitches placed and patient was sent home.  Apparently there was a lot of blood loss during this time.  She presented to the ED for increasing weakness and fatigue.  Hemoglobin was 5.8 on presentation.  2 units packed red cell transfusion was ordered.  Nephrology was consulted.  During the hospitalization, she underwent dialysis on 06/11/2022.  Subsequently, her hemoglobin has remained stable.  She hemodynamically stable.  She will be discharged home today with outpatient follow-up with PCP and dialysis unit as scheduled.  Discharge Diagnoses:   Symptomatic anemia secondary to acute blood loss anemia in a patient with anemia of chronic disease from renal failure Bleeding from vascular dialysis access -Hemoglobin 5.8 on  presentation.  Presented with bleeding from fistula/graft with acute blood loss despite stitches placed at [facility]. -Status post 2 units packed red cell transfusion with hemoglobin 9.4 this morning.  -Outpatient follow-up of H&H.   -Discharge patient home today.    End-stage renal disease on hemodialysis Metabolic acidosis -Nephrology was consulted, she underwent dialysis on 06/11/2022.  Outpatient follow-up with dialysis unit as scheduled   paroxysmal A-fib -Not on anticoagulation as an outpatient.  Mild intermittent bradycardia present.  Continue amiodarone and metoprolol tartrate   CAD with history of CABG and non-STEMI Hyperlipidemia -Resume aspirin.  Continue metoprolol tartrate and statin.  Outpatient follow-up with cardiology   Leukocytosis -Resolved   Goals of care -Palliative care consultation for goals of care is pending.  This can happen as an outpatient.  Currently listed as full code.  Discharge Instructions   Allergies as of 06/12/2022       Reactions   Amlodipine Swelling   Aspirin Other (See Comments)   High Doses Mess up her stomach; "makes my bowels have blood in them". Takes 81 mg EC Aspirin    Nitrofurantoin Hives   Bactrim [sulfamethoxazole-trimethoprim] Rash   Contrast Media [iodinated Contrast Media] Itching   Iron Itching, Other (See Comments)   "they gave me iron in dialysis; had to give me Benadryl cause I had to have the iron" (05/02/2012)   Tylenol [acetaminophen] Itching, Other (See Comments)   Makes her feet on fire per pt   Gabapentin Other (See Comments)   Unknown reaction   Iron Sucrose Other (See Comments)   Unknown   Ranexa [ranolazine] Other (See Comments)   Myoclonus-hospitalized    Sucroferric Oxyhydroxide Other (See Comments)   Unknown   Dexilant [dexlansoprazole] Other (See Comments)   Upset stomach   Hydralazine Itching  Has tolerated while inpatient Has tolerated while inpatient   Levaquin [levofloxacin In D5w] Rash    Morphine And Related Itching, Other (See Comments)   Itching in feet   Pantoprazole Rash   Plavix [clopidogrel Bisulfate] Rash   Protonix [pantoprazole Sodium] Rash   Venofer [ferric Oxide] Itching, Other (See Comments)   Patient reports using Benadryl prior to doses as [place]        Medication List     TAKE these medications    amiodarone 100 MG tablet Commonly known as: PACERONE Take 1 tablet (100 mg total) by mouth daily.   Aspirin 81 MG Caps Take 1 capsule by mouth daily at 12 noon.   benzonatate 100 MG capsule Commonly known as: TESSALON Take 100 mg by mouth 3 (three) times daily as needed for cough.   cyclobenzaprine 5 MG tablet Commonly known as: FLEXERIL Take 5 mg by mouth 3 (three) times daily as needed for muscle spasms.   folic acid 1 MG tablet Commonly known as: FOLVITE Take 1 mg by mouth daily.   hydrOXYzine 25 MG tablet Commonly known as: ATARAX Take 25 mg by mouth every 6 (six) hours as needed for itching.   levothyroxine 50 MCG tablet Commonly known as: SYNTHROID Take 1 tablet (50 mcg total) by mouth daily before breakfast. What changed:  when to take this Another medication with the same name was removed. Continue taking this medication, and follow the directions you see here.   metoprolol tartrate 25 MG tablet Commonly known as: LOPRESSOR Take 0.5 tablets (12.5 mg total) by mouth 2 (two) times daily.   multivitamin Tabs tablet Take 1 tablet by mouth daily.   nitroGLYCERIN 0.4 MG SL tablet Commonly known as: NITROSTAT Place 0.4 mg under the tongue every 5 (five) minutes as needed for chest pain.   omeprazole 20 MG capsule Commonly known as: PRILOSEC Take 2 capsules (40 mg total) by mouth daily as needed (acid reflux).   ondansetron 4 MG disintegrating tablet Commonly known as: ZOFRAN-ODT Take 4 mg by mouth every 8 (eight) hours as needed for nausea or vomiting.   predniSONE 20 MG tablet Commonly known as: DELTASONE Take 20  mg by mouth See admin instructions. TAKE '40mg'$  (TWO TABLETS) BY MOUTH AT 7:00 PM AND 11:00 PM THE NIGHT PRIOR TO PROCEDURE AND THEN TAKE '40mg'$  (2 TABLETS) WITH 50 MG OF BENADRYL THE MORNING OF PROCEDURE   rosuvastatin 10 MG tablet Commonly known as: CRESTOR Take 1 tablet by mouth once daily   sevelamer carbonate 800 MG tablet Commonly known as: RENVELA Take 2,400 mg by mouth 3 (three) times daily.         Follow-up Information     [facility]. Schedule an appointment as soon as possible for a visit in 1 week(s).   Contact information: [place] [address] [phone]                Allergies  Allergen Reactions   Amlodipine Swelling   Aspirin Other (See Comments)    High Doses Mess up her stomach; "makes my bowels have blood in them". Takes 81 mg EC Aspirin    Nitrofurantoin Hives   Bactrim [Sulfamethoxazole-Trimethoprim] Rash   Contrast Media [Iodinated Contrast Media] Itching   Iron Itching and Other (See Comments)    "they gave me iron in dialysis; had to give me Benadryl cause I had to have the iron" (05/02/2012)   Tylenol [Acetaminophen] Itching and Other (See Comments)  Makes her feet on fire per pt   Gabapentin Other (See Comments)    Unknown reaction   Iron Sucrose Other (See Comments)    Unknown   Ranexa [Ranolazine] Other (See Comments)    Myoclonus-hospitalized    Sucroferric Oxyhydroxide Other (See Comments)    Unknown   Dexilant [Dexlansoprazole] Other (See Comments)    Upset stomach   Hydralazine Itching    Has tolerated while inpatient Has tolerated while inpatient   Levaquin [Levofloxacin In D5w] Rash   Morphine And Related Itching and Other (See Comments)    Itching in feet   Pantoprazole Rash   Plavix [Clopidogrel Bisulfate] Rash   Protonix [Pantoprazole Sodium] Rash   Venofer [Ferric Oxide] Itching and Other (See Comments)    Patient reports using Benadryl prior to doses as [place]     Consultations: Nephrology was involved in the care and patient underwent dialysis as per nephrology.   Procedures/Studies: No results found.    Subjective: Patient seen and examined at bedside.  Denies worsening chest pain, fever, vomiting or shortness of breath.  Feels okay to go home today.  Discharge Exam: Vitals:   06/11/22 2048 06/12/22 0516  BP: (!) 171/50 (!) 158/50  Pulse: 63 (!) 57  Resp: 18 18  Temp: 98.4 F (36.9 C) 99.1 F (37.3 C)  SpO2: 100% 97%    General: Pt is alert, awake, not in acute distress.  Currently on room air.  Looks chronically ill and deconditioned. Cardiovascular: Mild intermittent bradycardia present; S1/S2 + Respiratory: bilateral decreased breath sounds at bases with scattered crackles Abdominal: Soft, NT, ND, bowel sounds + Extremities: Trace lower extremity edema; no cyanosis; left elbow dressing present   The results of significant diagnostics from this hospitalization (including imaging, microbiology, ancillary and laboratory) are listed below for reference.     Microbiology: No results found for this or any previous visit (from the past 240 hour(s)).   Labs: BNP (last 3 results) Recent Labs    10/17/21 1746 10/29/21 1631 11/08/21 1800  BNP 2,622.0* 1,743.6* 3,254.9*   Basic Metabolic Panel: Recent Labs  Lab 06/10/22 1431 06/11/22 0719 06/12/22 0509  NA 138 135 135  K 3.8 4.5 3.5  CL 101 101 99  CO2 20* 18* 25  GLUCOSE 147* 110* 105*  BUN 64* 68* 37*  CREATININE 8.10* 9.13* 5.35*  CALCIUM 8.7* 8.1* 8.0*  PHOS  --  4.4  --    Liver Function Tests: Recent Labs  Lab 06/10/22 1431 06/11/22 0719  AST 14*  --   ALT 18  --   ALKPHOS 101  --   BILITOT 0.8  --   PROT 6.5  --   ALBUMIN 3.3* 2.7*   No results for input(s): "LIPASE", "AMYLASE" in the last 168 hours. No results for input(s): "AMMONIA" in the last 168 hours. CBC: Recent Labs  Lab 06/10/22 1431 06/11/22 0719 06/12/22 0509  WBC 15.5* 9.4 9.3   NEUTROABS 13.5*  --  7.1  HGB 5.8* 7.4* 9.4*  HCT 19.0* 23.6* 28.1*  MCV 115.2* 103.1* 96.9  PLT 267 210 200   Cardiac Enzymes: No results for input(s): "CKTOTAL", "CKMB", "CKMBINDEX", "TROPONINI" in the last 168 hours. BNP: Invalid input(s): "POCBNP" CBG: No results for input(s): "GLUCAP" in the last 168 hours. D-Dimer No results for input(s): "DDIMER" in the last 72 hours. Hgb A1c No results for input(s): "HGBA1C" in the last 72 hours. Lipid Profile No results for input(s): "CHOL", "HDL", "LDLCALC", "TRIG", "CHOLHDL", "LDLDIRECT" in  the last 72 hours. Thyroid function studies No results for input(s): "TSH", "T4TOTAL", "T3FREE", "THYROIDAB" in the last 72 hours.  Invalid input(s): "FREET3" Anemia work up No results for input(s): "VITAMINB12", "FOLATE", "FERRITIN", "TIBC", "IRON", "RETICCTPCT" in the last 72 hours. Urinalysis    Component Value Date/Time   COLORURINE YELLOW 12/16/2012 1919   APPEARANCEUR CLOUDY (A) 12/16/2012 1919   LABSPEC 1.009 12/16/2012 1919   PHURINE 7.5 12/16/2012 1919   GLUCOSEU NEGATIVE 12/16/2012 1919   HGBUR TRACE (A) 12/16/2012 1919   BILIRUBINUR NEGATIVE 12/16/2012 1919   KETONESUR NEGATIVE 12/16/2012 1919   PROTEINUR 100 (A) 12/16/2012 1919   UROBILINOGEN 0.2 12/16/2012 1919   NITRITE NEGATIVE 12/16/2012 1919   LEUKOCYTESUR SMALL (A) 12/16/2012 1919   Sepsis Labs Recent Labs  Lab 06/10/22 1431 06/11/22 0719 06/12/22 0509  WBC 15.5* 9.4 9.3   Microbiology No results found for this or any previous visit (from the past 240 hour(s)).   Time coordinating discharge: 35 minutes  SIGNED:   Aline August, MD  [facility] 06/12/2022, 8:29 AM

## 2022-06-12 NOTE — Evaluation (Signed)
Physical Therapy Evaluation Patient Details Name: Shawna [name] MRN: [id number] DOB: [birth year] Today's Date: 06/12/2022  History of Present Illness  ALAZIA [name] is a 81 y.o. female with medical history significant of coronary artery disease with history of CABG and stents, carotid artery disease, heart failure with preserved EF, atrial fibrillation, end-stage renal disease on dialysis Monday Wednesday Friday secondary to hypertension, hyperlipidemia.  Patient seen for weakness and mild confusion that started this morning.  By patient report, the fistula graft in her left arm has clotted.  She was undergoing a procedure yesterday to try to break down the clot and that graft.  Unfortunately this was unsuccessful.  Later that day, it was noted that the graft was bleeding and the patient went to [facility] in order to address that.  3 stitches were placed and the patient was sent home.  It does not appear that labs were checked yesterday.  Both she and her husband report that she lost a lot of blood between being at the hospital and at home.  This morning, the patient woke up and was experiencing a lot of weakness and fatigue. She did go to dialysis today but was only for a short period of time due to her blood pressure being low.   Clinical Impression  Patient requires increased with labored movement for sitting up at bedside, very unsteady on feet having to lean on armrest of chair for support during transfers, required use of RW for safety demonstrating slow labored cadence without loss of balance, occasional bumping into nearby objects and limited due to fatigue.  Patient tolerated sitting up in chair after therapy - nurse aware.  Patient will benefit from continued skilled physical therapy in hospital and recommended venue below to increase strength, balance, endurance for safe ADLs and gait.       Recommendations for follow up therapy are one component of a multi-disciplinary discharge planning  process, led by the attending physician.  Recommendations may be updated based on patient status, additional functional criteria and insurance authorization.  Follow Up Recommendations Home health PT      Assistance Recommended at Discharge    Patient can return home with the following  A little help with walking and/or transfers;A little help with bathing/dressing/bathroom;Help with stairs or ramp for entrance;Assistance with cooking/housework    Equipment Recommendations None recommended by PT  Recommendations for Other Services       Functional Status Assessment Patient has had a recent decline in their functional status and demonstrates the ability to make significant improvements in function in a reasonable and predictable amount of time.     Precautions / Restrictions Precautions Precautions: Fall Restrictions Weight Bearing Restrictions: No      Mobility  Bed Mobility Overal bed mobility: Needs Assistance Bed Mobility: Supine to Sit     Supine to sit: Min assist     General bed mobility comments: increased time, labored movement    Transfers Overall transfer level: Needs assistance Equipment used: Rolling walker (2 wheels), None, 1 person hand held assist Transfers: Sit to/from Stand, Bed to chair/wheelchair/BSC Sit to Stand: Min guard, Min assist   Step pivot transfers: Min guard, Min assist       General transfer comment: has to lean on armrest of chair with slow labored movement when not using an AD, safer using RW    Ambulation/Gait Ambulation/Gait assistance: Min guard, Min assist Gait Distance (Feet): 40 Feet Assistive device: Rolling walker (2 wheels) Gait Pattern/deviations: Decreased step  length - right, Decreased step length - left, Decreased stride length, Trunk flexed Gait velocity: decreased     General Gait Details: slow labored cadence with occasional bumping into nearby objects using RW, one episode of knees buckling, but able to  recover without loss of balance, limited mostly due to fatigue  Stairs            Wheelchair Mobility    Modified Rankin (Stroke Patients Only)       Balance Overall balance assessment: Needs assistance Sitting-balance support: Feet supported, No upper extremity supported Sitting balance-Leahy Scale: Fair Sitting balance - Comments: fair/good seated at EOB, unable to put on socks when leaning forward due to near loss of balance   Standing balance support: During functional activity, No upper extremity supported Standing balance-Leahy Scale: Poor Standing balance comment: fair using RW                             Pertinent Vitals/Pain Pain Assessment Pain Assessment: No/denies pain    Home Living Family/patient expects to be discharged to:: Private residence Living Arrangements: Spouse/significant other Available Help at Discharge: Family;Available 24 hours/day Type of Home: House Home Access: Stairs to enter Entrance Stairs-Rails: Right Entrance Stairs-Number of Steps: 2   Home Layout: One level Home Equipment: Rollator (4 wheels);Cane - single point;Shower seat;BSC/3in1      Prior Function Prior Level of Function : Needs assist       Physical Assist : Mobility (physical);ADLs (physical) Mobility (physical): Bed mobility;Transfers;Gait;Stairs   Mobility Comments: household ambulator without AD, uses quad-cane for longer distances, "per patient" ADLs Comments: assisted by family     Hand Dominance   Dominant Hand: Right    Extremity/Trunk Assessment   Upper Extremity Assessment Upper Extremity Assessment: Generalized weakness    Lower Extremity Assessment Lower Extremity Assessment: Generalized weakness    Cervical / Trunk Assessment Cervical / Trunk Assessment: Kyphotic  Communication   Communication: No difficulties  Cognition Arousal/Alertness: Awake/alert Behavior During Therapy: WFL for tasks assessed/performed Overall  Cognitive Status: Within Functional Limits for tasks assessed                                          General Comments      Exercises     Assessment/Plan    PT Assessment Patient needs continued PT services  PT Problem List Decreased strength;Decreased activity tolerance;Decreased balance;Decreased mobility       PT Treatment Interventions DME instruction;Gait training;Stair training;Functional mobility training;Therapeutic activities;Therapeutic exercise;Patient/family education;Balance training    PT Goals (Current goals can be found in the Care Plan section)  Acute Rehab PT Goals Patient Stated Goal: return home with family to assist PT Goal Formulation: With patient Time For Goal Achievement: 06/17/22 Potential to Achieve Goals: Good    Frequency Min 3X/week     Co-evaluation               AM-PAC PT "6 Clicks" Mobility  Outcome Measure Help needed turning from your back to your side while in a flat bed without using bedrails?: A Little Help needed moving from lying on your back to sitting on the side of a flat bed without using bedrails?: A Little Help needed moving to and from a bed to a chair (including a wheelchair)?: A Lot Help needed standing up from a chair using your arms (  e.g., wheelchair or bedside chair)?: A Little Help needed to walk in hospital room?: A Little Help needed climbing 3-5 steps with a railing? : A Lot 6 Click Score: 16    End of Session   Activity Tolerance: Patient tolerated treatment well Patient left: in chair;with call bell/phone within reach Nurse Communication: Mobility status PT Visit Diagnosis: Unsteadiness on feet (R26.81);Other abnormalities of gait and mobility (R26.89);Muscle weakness (generalized) (M62.81)    Time: 8250-5397 PT Time Calculation (min) (ACUTE ONLY): 24 min   Charges:   PT Evaluation $PT Eval Moderate Complexity: 1 Mod PT Treatments $Therapeutic Activity: 23-37 mins         1:52 PM, 06/12/22 Lonell Grandchild, MPT Physical Therapist with [facility] [phone] office [phone] mobile phone

## 2022-06-13 LAB — CBC
HCT: 23.6 % — ABNORMAL LOW (ref 36.0–46.0)
Hemoglobin: 7.4 g/dL — ABNORMAL LOW (ref 12.0–15.0)
MCH: 32.3 pg (ref 26.0–34.0)
MCHC: 31.4 g/dL (ref 30.0–36.0)
MCV: 103.1 fL — ABNORMAL HIGH (ref 80.0–100.0)
Platelets: 210 10*3/uL (ref 150–400)
RBC: 2.29 MIL/uL — ABNORMAL LOW (ref 3.87–5.11)
RDW: 26.4 % — ABNORMAL HIGH (ref 11.5–15.5)
WBC: 9.4 10*3/uL (ref 4.0–10.5)
nRBC: 0.8 % — ABNORMAL HIGH (ref 0.0–0.2)

## 2022-06-13 LAB — BASIC METABOLIC PANEL
Anion gap: 11 (ref 5–15)
BUN: 37 mg/dL — ABNORMAL HIGH (ref 8–23)
CO2: 25 mmol/L (ref 22–32)
Calcium: 8 mg/dL — ABNORMAL LOW (ref 8.9–10.3)
Chloride: 99 mmol/L (ref 98–111)
Creatinine, Ser: 5.35 mg/dL — ABNORMAL HIGH (ref 0.44–1.00)
GFR, Estimated: 8 mL/min — ABNORMAL LOW (ref >60)
Glucose, Bld: 105 mg/dL — ABNORMAL HIGH (ref 70–99)
Potassium: 3.5 mmol/L (ref 3.5–5.1)
Sodium: 135 mmol/L (ref 135–145)

## 2022-06-14 ENCOUNTER — Inpatient Hospital Stay: Payer: Medicare HMO | Attending: Physician Assistant | Admitting: Hematology

## 2022-06-17 ENCOUNTER — Other Ambulatory Visit: Payer: Self-pay

## 2022-06-17 ENCOUNTER — Encounter (HOSPITAL_COMMUNITY): Payer: Self-pay | Admitting: Emergency Medicine

## 2022-06-17 ENCOUNTER — Emergency Department (HOSPITAL_COMMUNITY): Payer: Medicare HMO

## 2022-06-17 ENCOUNTER — Emergency Department (HOSPITAL_COMMUNITY)
Admission: EM | Admit: 2022-06-17 | Discharge: 2022-06-17 | Disposition: A | Discharge status: Home / Self Care | Payer: Medicare HMO | Attending: Emergency Medicine | Admitting: Emergency Medicine

## 2022-06-17 DIAGNOSIS — N186 End stage renal disease: Secondary | ICD-10-CM | POA: Diagnosis not present

## 2022-06-17 DIAGNOSIS — I150 Renovascular hypertension: Secondary | ICD-10-CM | POA: Diagnosis not present

## 2022-06-17 DIAGNOSIS — J189 Pneumonia, unspecified organism: Secondary | ICD-10-CM | POA: Diagnosis not present

## 2022-06-17 DIAGNOSIS — I2510 Atherosclerotic heart disease of native coronary artery without angina pectoris: Secondary | ICD-10-CM | POA: Insufficient documentation

## 2022-06-17 DIAGNOSIS — Z1152 Encounter for screening for COVID-19: Secondary | ICD-10-CM | POA: Insufficient documentation

## 2022-06-17 DIAGNOSIS — R059 Cough, unspecified: Secondary | ICD-10-CM | POA: Diagnosis present

## 2022-06-17 DIAGNOSIS — I132 Hypertensive heart and chronic kidney disease with heart failure and with stage 5 chronic kidney disease, or end stage renal disease: Secondary | ICD-10-CM | POA: Insufficient documentation

## 2022-06-17 DIAGNOSIS — I5032 Chronic diastolic (congestive) heart failure: Secondary | ICD-10-CM | POA: Diagnosis not present

## 2022-06-17 DIAGNOSIS — Z8673 Personal history of transient ischemic attack (TIA), and cerebral infarction without residual deficits: Secondary | ICD-10-CM | POA: Insufficient documentation

## 2022-06-17 DIAGNOSIS — I480 Paroxysmal atrial fibrillation: Secondary | ICD-10-CM | POA: Diagnosis not present

## 2022-06-17 DIAGNOSIS — Z85038 Personal history of other malignant neoplasm of large intestine: Secondary | ICD-10-CM | POA: Diagnosis not present

## 2022-06-17 DIAGNOSIS — Z79899 Other long term (current) drug therapy: Secondary | ICD-10-CM | POA: Diagnosis not present

## 2022-06-17 LAB — TYPE AND SCREEN
ABO/RH(D): O POS
Antibody Screen: NEGATIVE
Donor AG Type: NEGATIVE
Donor AG Type: NEGATIVE
Donor AG Type: NEGATIVE
Unit division: 0
Unit division: 0
Unit division: 0

## 2022-06-17 LAB — BPAM RBC
Blood Product Expiration Date: 202312312359
Blood Product Expiration Date: 202401102359
Blood Product Expiration Date: 202401112359
ISSUE DATE / TIME: 202312012342
ISSUE DATE / TIME: 202312021648
Unit Type and Rh: 5100
Unit Type and Rh: 5100
Unit Type and Rh: 5100

## 2022-06-17 LAB — BASIC METABOLIC PANEL
Anion gap: 11 (ref 5–15)
BUN: 35 mg/dL — ABNORMAL HIGH (ref 8–23)
CO2: 25 mmol/L (ref 22–32)
Calcium: 8.7 mg/dL — ABNORMAL LOW (ref 8.9–10.3)
Chloride: 104 mmol/L (ref 98–111)
Creatinine, Ser: 6.32 mg/dL — ABNORMAL HIGH (ref 0.44–1.00)
GFR, Estimated: 6 mL/min — ABNORMAL LOW (ref >60)
Glucose, Bld: 175 mg/dL — ABNORMAL HIGH (ref 70–99)
Potassium: 3 mmol/L — ABNORMAL LOW (ref 3.5–5.1)
Sodium: 140 mmol/L (ref 135–145)

## 2022-06-17 LAB — CBC WITH DIFFERENTIAL/PLATELET
Abs Immature Granulocytes: 0.07 10*3/uL (ref 0.00–0.07)
Basophils Absolute: 0.1 10*3/uL (ref 0.0–0.1)
Basophils Relative: 1 %
Eosinophils Absolute: 0.6 10*3/uL — ABNORMAL HIGH (ref 0.0–0.5)
Eosinophils Relative: 5 %
HCT: 28.6 % — ABNORMAL LOW (ref 36.0–46.0)
Hemoglobin: 9.3 g/dL — ABNORMAL LOW (ref 12.0–15.0)
Immature Granulocytes: 1 %
Lymphocytes Relative: 5 %
Lymphs Abs: 0.6 10*3/uL — ABNORMAL LOW (ref 0.7–4.0)
MCH: 33 pg (ref 26.0–34.0)
MCHC: 32.5 g/dL (ref 30.0–36.0)
MCV: 101.4 fL — ABNORMAL HIGH (ref 80.0–100.0)
Monocytes Absolute: 1.3 10*3/uL — ABNORMAL HIGH (ref 0.1–1.0)
Monocytes Relative: 11 %
Neutro Abs: 9.4 10*3/uL — ABNORMAL HIGH (ref 1.7–7.7)
Neutrophils Relative %: 77 %
Platelets: 281 10*3/uL (ref 150–400)
RBC: 2.82 MIL/uL — ABNORMAL LOW (ref 3.87–5.11)
RDW: 22.9 % — ABNORMAL HIGH (ref 11.5–15.5)
WBC: 12.1 10*3/uL — ABNORMAL HIGH (ref 4.0–10.5)
nRBC: 0 % (ref 0.0–0.2)

## 2022-06-17 LAB — RESP PANEL BY RT-PCR (FLU A&B, COVID) ARPGX2
Influenza A by PCR: NEGATIVE
Influenza B by PCR: NEGATIVE
SARS Coronavirus 2 by RT PCR: NEGATIVE

## 2022-06-17 MED ORDER — DOXYCYCLINE HYCLATE 100 MG PO CAPS: 100.0000 mg | ORAL_CAPSULE | Freq: Two times a day (BID) | ORAL | 0 refills | Status: DC

## 2022-06-17 MED ORDER — AMOXICILLIN 500 MG PO CAPS: 1000.0000 mg | ORAL_CAPSULE | Freq: Three times a day (TID) | ORAL | 0 refills | Status: DC

## 2022-06-17 NOTE — Discharge Instructions (Addendum)
You were seen today for cough.  It does look like you may have pneumonia.  Take antibiotics as prescribed.  Follow-up with your primary doctor.  Your blood pressure was noted to be elevated.  This may be related to your renal disease.  Make sure to have this rechecked by your primary doctor.  If you have any new or worsening symptoms, you should be reevaluated.  Your COVID and influenza tests are negative.

## 2022-06-17 NOTE — ED Triage Notes (Signed)
Pt arrives POV from home. Pt states she has been having a bunch of congestion and cough since yesterday. Pts husband concerned about pneumonia.

## 2022-06-17 NOTE — ED Provider Notes (Signed)
[facility] EMERGENCY DEPARTMENT Provider Note   CSN: [id number] Arrival date & time: 06/17/22  0330     History  Chief Complaint  Patient presents with   Cough    Shawna [name] is a 81 y.o. female.  Cough       This is an 81 year old female with a history of end-stage renal disease who presents with cough and congestion.  Patient reports onset of symptoms overnight.  She states that she began to cough.  She states that the cough is productive.  She had post episode of emesis.  She was having shortness of breath associated.  No chest pain.  Denies fevers.  No known sick contacts.  She last dialyzed on Wednesday.  Home Medications Prior to Admission medications   Medication Sig Start Date End Date Taking? Authorizing Provider  amoxicillin (AMOXIL) 500 MG capsule Take 2 capsules (1,000 mg total) by mouth 3 (three) times daily. 06/17/22  Yes Haider Hornaday, Barbette Hair, MD  doxycycline (VIBRAMYCIN) 100 MG capsule Take 1 capsule (100 mg total) by mouth 2 (two) times daily. 06/17/22  Yes Mehak Roskelley, Barbette Hair, MD  amiodarone (PACERONE) 100 MG tablet Take 1 tablet (100 mg total) by mouth daily. 04/27/22   Nita Sells, MD  Aspirin 81 MG CAPS Take 1 capsule by mouth daily at 12 noon.    [provider]  benzonatate (TESSALON) 100 MG capsule Take 100 mg by mouth 3 (three) times daily as needed for cough. 05/10/22   [provider]  cyclobenzaprine (FLEXERIL) 5 MG tablet Take 5 mg by mouth 3 (three) times daily as needed for muscle spasms. 12/27/21   [provider]  folic acid (FOLVITE) 1 MG tablet Take 1 mg by mouth daily. 05/24/22   [provider]  hydrOXYzine (ATARAX) 25 MG tablet Take 25 mg by mouth every 6 (six) hours as needed for itching. 06/06/22   [provider]  levothyroxine (SYNTHROID) 50 MCG tablet Take 1 tablet (50 mcg total) by mouth daily before breakfast. 06/12/22   Aline August, MD  metoprolol tartrate (LOPRESSOR) 25 MG tablet  Take 0.5 tablets (12.5 mg total) by mouth 2 (two) times daily. 03/04/22   Arnoldo Lenis, MD  multivitamin (RENA-VIT) TABS tablet Take 1 tablet by mouth daily.    [provider]  nitroGLYCERIN (NITROSTAT) 0.4 MG SL tablet Place 0.4 mg under the tongue every 5 (five) minutes as needed for chest pain.    [provider]  omeprazole (PRILOSEC) 20 MG capsule Take 2 capsules (40 mg total) by mouth daily as needed (acid reflux). 06/12/22   Aline August, MD  ondansetron (ZOFRAN-ODT) 4 MG disintegrating tablet Take 4 mg by mouth every 8 (eight) hours as needed for nausea or vomiting. 05/05/22   [provider]  predniSONE (DELTASONE) 20 MG tablet Take 20 mg by mouth See admin instructions. TAKE '40mg'$  (TWO TABLETS) BY MOUTH AT 7:00 PM AND 11:00 PM THE NIGHT PRIOR TO PROCEDURE AND THEN TAKE '40mg'$  (2 TABLETS) WITH 50 MG OF BENADRYL THE MORNING OF PROCEDURE 06/08/22   [provider]  rosuvastatin (CRESTOR) 10 MG tablet Take 1 tablet by mouth once daily 06/06/22   Ahmed Prima, [place] M, PA-C  sevelamer carbonate (RENVELA) 800 MG tablet Take 2,400 mg by mouth 3 (three) times daily. 12/27/21   [provider]      Allergies    Amlodipine, Aspirin, Nitrofurantoin, Bactrim [sulfamethoxazole-trimethoprim], Contrast media [iodinated contrast media], Iron, Tylenol [acetaminophen], Gabapentin, Iron sucrose, Ranexa [ranolazine], Sucroferric oxyhydroxide,  Dexilant [dexlansoprazole], Hydralazine, Levaquin [levofloxacin in d5w], Morphine and related, Pantoprazole, Plavix [clopidogrel bisulfate], Protonix [pantoprazole sodium], and Venofer [ferric oxide]    Review of Systems   Review of Systems  Constitutional:  Negative for fever.  HENT:  Positive for congestion.   Respiratory:  Positive for cough.   All other systems reviewed and are negative.   Physical Exam Updated Vital Signs BP (!) 194/72 (BP Location: Right Arm)   Pulse 80   Temp 97.9 F (36.6 C) (Oral)   Resp 18    Ht 1.575 m ('5\' 2"'$ )   Wt 58.6 kg   SpO2 96%   BMI 23.63 kg/m  Physical Exam Vitals and nursing note reviewed.  Constitutional:      Appearance: She is well-developed. She is not ill-appearing.  HENT:     Head: Normocephalic and atraumatic.  Eyes:     Pupils: Pupils are equal, round, and reactive to light.  Cardiovascular:     Rate and Rhythm: Normal rate and regular rhythm.     Heart sounds: Normal heart sounds.  Pulmonary:     Effort: Pulmonary effort is normal. No respiratory distress.     Breath sounds: No wheezing.  Abdominal:     Palpations: Abdomen is soft.     Tenderness: There is no abdominal tenderness.  Musculoskeletal:     Cervical back: Neck supple.     Comments: Trace bilateral lower extremity edema  Skin:    General: Skin is warm and dry.  Neurological:     Mental Status: She is alert and oriented to person, place, and time.  Psychiatric:        Mood and Affect: Mood normal.     ED Results / Procedures / Treatments   Labs (all labs ordered are listed, but only abnormal results are displayed) Labs Reviewed  CBC WITH DIFFERENTIAL/PLATELET - Abnormal; Notable for the following components:      Result Value   WBC 12.1 (*)    RBC 2.82 (*)    Hemoglobin 9.3 (*)    HCT 28.6 (*)    MCV 101.4 (*)    RDW 22.9 (*)    Neutro Abs 9.4 (*)    Lymphs Abs 0.6 (*)    Monocytes Absolute 1.3 (*)    Eosinophils Absolute 0.6 (*)    All other components within normal limits  BASIC METABOLIC PANEL - Abnormal; Notable for the following components:   Potassium 3.0 (*)    Glucose, Bld 175 (*)    BUN 35 (*)    Creatinine, Ser 6.32 (*)    Calcium 8.7 (*)    GFR, Estimated 6 (*)    All other components within normal limits  RESP PANEL BY RT-PCR (FLU A&B, COVID) [id number]    EKG EKG Interpretation  Date/Time:  Friday June 17 2022 06:04:44 EST Ventricular Rate:  79 PR Interval:  231 QRS Duration: 155 QT Interval:  469 QTC Calculation: 538 R Axis:   45 Text  Interpretation: Sinus rhythm Prolonged PR interval IVCD, consider atypical LBBB similar to prior Confirmed by Thayer Jew [phone]) on 06/17/2022 6:08:44 AM  Radiology DG Chest 2 View  Result Date: 06/17/2022 CLINICAL DATA:  Cough and congestion. EXAM: CHEST - 2 VIEW COMPARISON:  04/20/2022. FINDINGS: The heart is enlarged and the mediastinal contour stable. Atherosclerotic calcification of the aorta is noted. The pulmonary vasculature is distended. Interstitial prominence is present bilaterally and mild airspace disease is noted at the lung bases. There is a small left pleural  effusion. No pneumothorax. A left internal jugular central venous catheter terminates over the right atrium. A left axillary vascular stent is noted. No acute osseous abnormality. IMPRESSION: 1. Cardiomegaly with pulmonary vascular congestion. 2. Interstitial prominence bilaterally, possible edema or infiltrate. 3. Mild airspace disease at the lung bases, possible atelectasis, edema, or infiltrate. Electronically Signed   By: Brett Fairy M.D.   On: 06/17/2022 04:34    Procedures Procedures    Medications Ordered in ED Medications - No data to display  ED Course/ Medical Decision Making/ A&P                           Medical Decision Making Amount and/or Complexity of Data Reviewed Labs: ordered. Radiology: ordered.  Risk Prescription drug management.   This patient presents to the ED for concern of cough, this involves an extensive number of treatment options, and is a complaint that carries with it a high risk of complications and morbidity.  I considered the following differential and admission for this acute, potentially life threatening condition.  The differential diagnosis includes pneumonia, viral illness such as COVID or influenza, bronchospasm,  MDM:    This is an 81 year old female who presents with shortness of breath and cough.  She is overall nontoxic-appearing.  Vital signs are notable for blood  pressure 194/72.  She is not hypoxic or in respiratory distress.  She states that she normally does not have elevated blood pressure "except for when I come to the ED."  She does have a history of end-stage renal disease on dialysis.  She does not take blood pressure medications.  EKG shows no evidence of acute ischemia or arrhythmia.  Chest x-ray shows changes consistent with edema versus infiltrate.  She is afebrile.  She has a slight leukocytosis with a left shift of 12.1.  Metabolic panel shows mild hypokalemia.  COVID and influenza testing are negative.  Given patient's symptoms and leukocytosis, would elect to treat for pneumonia.  Technically she would qualify for hospital-acquired pneumonia given her recent hospitalization and her dialysis; however, her chest x-ray is not fully conclusive.  Feel it is reasonable to start with amoxicillin and doxycycline.  Updated the patient and her husband.  (Labs, imaging, consults)  Labs: I Ordered, and personally interpreted labs.  The pertinent results include: CBC, BMP, COVID, influenza  Imaging Studies ordered: I ordered imaging studies including chest x-ray I independently visualized and interpreted imaging. I agree with the radiologist interpretation  Additional history obtained from chart review.  External records from outside source obtained and reviewed including prior evaluations and admissions  Cardiac Monitoring: The patient was maintained on a cardiac monitor.  I personally viewed and interpreted the cardiac monitored which showed an underlying rhythm of: Sinus rhythm  Reevaluation: After the interventions noted above, I reevaluated the patient and found that they have :improved  Social Determinants of Health:  lives with husband, end-stage renal disease  Disposition: Discharge  Co morbidities that complicate the patient evaluation  Past Medical History:  Diagnosis Date   Acute on chronic respiratory failure with hypoxia ([place])  10/10/2016   Anxiety    Arthritis    AVM (arteriovenous malformation) of colon    CAD (coronary artery disease)    a. s/p CABG in 2013 b. DES to D1 in 10/2016. c. cath in 07/2018 showing patent grafts with occlusion of D1 at prior stent site and progression of PDA disease --> medical management recommended   Carotid  artery disease ([place])    a. 16-60% LICA, 12/3014    Chronic anemia    Chronic bronchitis ([facility])    Chronic diastolic CHF (congestive heart failure) ([place])    a. 02/2012 Echo EF 60-65%, nl wall motion, Gr 1 DD, mod MR   Colon cancer ([place]) 1992   Esophageal stricture    ESRD on hemodialysis ([place])    ESRD due to HTN, started dialysis 2011 and gets HD at [facility] with Dr Hinda Lenis on MWF schedule.  Access is LUA AVF as of Sept 2014.    GERD (gastroesophageal reflux disease)    High cholesterol 12/2011   History of blood transfusion 07/2011; 12/2011; 01/2012 X 2; 04/2012   History of gout    History of lower GI bleeding    Hypertension    Iron deficiency anemia    Jugular vein occlusion, right (H[facility])    Mitral regurgitation    a. Moderate by echo, 02/2012   Myocardial infarction [facility])    NSVT (nonsustained ventricular tachycardia) ([place])    Ovarian cancer ([place]) 1992   PAF (paroxysmal atrial fibrillation) ([place])    Pneumonia ~ 2009   PUD (peptic ulcer disease)    TIA (transient ischemic attack)      Medicines Meds ordered this encounter  Medications   amoxicillin (AMOXIL) 500 MG capsule    Sig: Take 2 capsules (1,000 mg total) by mouth 3 (three) times daily.    Dispense:  42 capsule    Refill:  0   doxycycline (VIBRAMYCIN) 100 MG capsule    Sig: Take 1 capsule (100 mg total) by mouth 2 (two) times daily.    Dispense:  20 capsule    Refill:  0    I have reviewed the patients home medicines and have made adjustments as needed  Problem List / ED Course: Problem List Items Addressed This Visit   None Visit Diagnoses     Community acquired pneumonia, unspecified laterality     -  Primary   Relevant Medications   amoxicillin (AMOXIL) 500 MG capsule   doxycycline (VIBRAMYCIN) 100 MG capsule   Renovascular hypertension                       Final Clinical Impression(s) / ED Diagnoses Final diagnoses:  Community acquired pneumonia, unspecified laterality  Renovascular hypertension    Rx / DC Orders ED Discharge Orders          Ordered    amoxicillin (AMOXIL) 500 MG capsule  3 times daily        06/17/22 0701    doxycycline (VIBRAMYCIN) 100 MG capsule  2 times daily        06/17/22 0701              Cearra Portnoy, Barbette Hair, MD 06/17/22 [phone]

## 2022-07-13 ENCOUNTER — Encounter: Payer: Self-pay | Admitting: Physician Assistant

## 2022-07-13 NOTE — Progress Notes (Signed)
Cardiology Office Note    Date:  07/14/2022   ID:  Shawna [name], DOB [birth year], MRN [id number]  PCP:  [facility]  Cardiologist:  Dina Rich, MD  Electrophysiologist:  None   Chief Complaint: f/u CAD, CHF, atrial arrhythmias  History of Present Illness:   Shawna [name] is a 82 y.o. female with history of CAD (s/p CABG in 2013, DES to D1 in 10/2016, cath in 07/2018 and 07/2021 managed medically), HTN, HLD, PAF/flutter (not on anticoagulation given history of GIB), chronic anemia, NSVT (treated with amiodarone), ESRD on HD, chronic HFrEF, mitral regurgitation and stenosis/tricuspid regurgitation, LBBB, colon CA, esophageal stricture, carotid artery disease (<40% by duplex 2016), PUD, TIA who presents for overdue follow-up.  Per remote notes from Dr. Purvis Sheffield, she previously had long history of noncompliance with antihypertensive therapy and makes adjustments on her own. She has since been followed by Dr. Wyline Mood. She has been admitted multiple times over the last few years. Last cath was performed 07/2021 as outlined below in the setting of declined EF 35% by echo and elevated troponin to the 400 range, essentially showing no significant change from 2020, medical therapy recommended. LVEF looked to be 40-45% by cath per notes. It was felt this reflected demand ischemia. Echo in 07/2021 had shown EF 30-35%, G2 DD, moderately reduced RVSF, mildly elevated PASP, severe LAE, mild RAE, moderate MR, mild-moderate MS, mild-moderate TR, aortic sclerosis without stenosis. She also has a history of afib/flutter but has not been on anticoagulation with prior history of GIB and recurrent anemia requiring blood transfusions.  The last time she was seen by our team was during admission 11/2021 for afib in the setting of AMS and subacute mastoiditis versus otitis media. She was treated with IV amiodarone and converted back to NSR, discharged on titrated dose of amiodarone. Has not followed up since  that time but was admitted 04/2022 and 06/2022 for multiple medical issues. Amiodarone dose was reduced due to bradycardia.  She is seen for follow-up largely stable from cardiac standpoint without CP, SOB, palpitations or syncope. Her chronic weakness is unchanged. She dialyzes 3x/week in [place] on MWF. She reports having labs planned there tomorrow. Her husband accompanies her to this appointment. She does not recall meeting me a few years back.  Labwork independently reviewed: 06/17/22 K 3.0, Cr 6.32, Hgb 9.3, Plt 281, AST/ALT not up, albumin 3.3 05/2022 outside office LDL 30, trig 75, TSH 6.056, free T4 wnl   Cardiology Studies:   Studies reviewed are outlined and summarized above. Reports included below if pertinent.   Cath 07/2021    Mid LM to Dist LM lesion is 60% stenosed.   Prox LAD lesion is 90% stenosed.   Ost Cx to Mid Cx lesion is 99% stenosed.   Ost RCA to Prox RCA lesion is 100% stenosed.   Ost Ramus to Ramus lesion is 80% stenosed.   Ost 1st Diag to 1st Diag lesion is 100% stenosed.   Ost RPDA to RPDA lesion is 75% stenosed.   2nd Mrg lesion is 60% stenosed.   Dist LAD lesion is 70% stenosed.   LIMA and is large.   SVG and is large.   SVG and is normal in caliber.   SVG and is large.   The graft exhibits no disease.   The graft exhibits no disease.   The graft exhibits no disease.   LV end diastolic pressure is mildly elevated.   The left ventricular ejection fraction is 35-45%  by visual estimate.    History obtained from patient's chart.: 82 y.o. female w/ PMH of CAD (s/p CABG in 2013, DES to D1 in 10/2016, cath in 07/2018 showing patent grafts with occlusion of D1 at prior stent site and progression of PDA disease --> medical management recommended), HFpEF, HTN, HLD, paroxysmal atrial fibrillation (not on anticoagulation given history of GIB), NSVT, chronic anemia and ESRD who is currently admitted for evaluation of recurrent atrial flutter and chest pain.  Chest pain  pressure I suspect to be with is a dialysis patient is a right probably dialyzed and her medications.  Her enzymes were elevated to a troponin of 400.  Her EF was reduced by 2D echo to the 35% range.  She was referred for diagnostic coronary angiography.     IMPRESSION: [name] anatomy is for all intents and purposes unchanged from her prior cath performed by Dr. Swaziland in 2020.  All her grafts are patent.  Her LVEDP was 18.  Her EF looks to be in the 40 to 45% range with an anteroapical wall motion abnormality.  I suspect that her chest pain and mild enzyme leak was related to "demand ischemia".  Medical therapy will be recommended.  If femoral angiogram was performed and the stick appeared to be slightly high and therefore Mynx closure was not performed.  The patient will need a manual hold in the recovery bay prior to going to the floor.  Dr. Diona Browner, the patient's attending cardiologist, was notified of these results.   [name]. MD, [facility] 07/22/2021 11:53 AM   2d echo 07/16/21   1. Left ventricular ejection fraction, by estimation, is 30 to 35%. The  left ventricle has moderately decreased function. The left ventricle  demonstrates regional wall motion abnormalities (see scoring  diagram/findings for description). There is mild  left ventricular hypertrophy. Left ventricular diastolic parameters are  consistent with Grade II diastolic dysfunction (pseudonormalization).  Elevated left atrial pressure.   2. Right ventricular systolic function is moderately reduced. The right  ventricular size is normal. There is mildly elevated pulmonary artery  systolic pressure. The estimated right ventricular systolic pressure is  43.4 mmHg.   3. Left atrial size was severely dilated.   4. Right atrial size was mildly dilated.   5. The mitral valve is degenerative. Moderate mitral valve regurgitation.  Mild to moderate mitral stenosis. MG at 70bpm, MVA 1.2 cm^2 by  continuity equation.  Moderate mitral annular calcification.   6. Tricuspid valve regurgitation is mild to moderate.   7. The aortic valve is tricuspid. Aortic valve regurgitation is not  visualized. Aortic valve sclerosis/calcification is present, without any  evidence of aortic stenosis.   8. The inferior vena cava is normal in size with greater than 50%  respiratory variability, suggesting right atrial pressure of 3 mmHg.   Carotid duplex 2016 <40% BICA      Past Medical History:  Diagnosis Date   Acute on chronic respiratory failure with hypoxia (HCC) 10/10/2016   Anxiety    Arthritis    AVM (arteriovenous malformation) of colon    CAD (coronary artery disease)    a. s/p CABG in 2013 b. DES to D1 in 10/2016. c. cath in 07/2018 showing patent grafts with occlusion of D1 at prior stent site and progression of PDA disease --> medical management recommended   Carotid artery disease (HCC)    a. 60-79% LICA, 03/2012    Chronic anemia    Chronic bronchitis (HCC)  Chronic HFrEF (heart failure with reduced ejection fraction) (HCC)    Colon cancer (HCC) 1992   Esophageal stricture    ESRD on hemodialysis (HCC)    ESRD due to HTN, started dialysis 2011 and gets HD at [facility] with Dr Fausto Skillern on MWF schedule.  Access is LUA AVF as of Sept 2014.    GERD (gastroesophageal reflux disease)    High cholesterol 12/2011   History of blood transfusion 07/2011; 12/2011; 01/2012 X 2; 04/2012   History of gout    History of lower GI bleeding    Hypertension    Iron deficiency anemia    Jugular vein occlusion, right (HCC)    Mitral regurgitation    a. Moderate by echo, 02/2012   Mitral valve disease    Myocardial infarction [facility])    NSVT (nonsustained ventricular tachycardia) (HCC)    Ovarian cancer (HCC) 1992   PAF (paroxysmal atrial fibrillation) (HCC)    Pneumonia ~ 2009   PUD (peptic ulcer disease)    TIA (transient ischemic attack)    Tricuspid valve disease     Past Surgical History:  Procedure  Laterality Date   A/V SHUNTOGRAM Left 03/19/2019   Procedure: A/V SHUNTOGRAM;  Surgeon: Renford Dills, MD;  Location: [facility] INVASIVE CV LAB;  Service: Cardiovascular;  Laterality: Left;   ABDOMINAL HYSTERECTOMY  1992   APPENDECTOMY  06/1990   AV FISTULA PLACEMENT  07/2009   left upper arm   AV FISTULA PLACEMENT Right 09/06/2016   Procedure: RIGHT FOREARM ARTERIOVENOUS (AV) GRAFT;  Surgeon: Sherren Kerns, MD;  Location: [facility] OR;  Service: Vascular;  Laterality: Right;   AV FISTULA PLACEMENT N/A 02/24/2017   Procedure: INSERTION OF ARTERIOVENOUS (AV) GORE-TEX GRAFT ARM (BRACHIAL AXILLARY);  Surgeon: Renford Dills, MD;  Location: [facility] ORS;  Service: Vascular;  Laterality: N/A;   AVGG REMOVAL Right 09/06/2016   Procedure: REMOVAL OF Right Arm ARTERIOVENOUS GORETEX GRAFT and Vein Patch angioplasty of brachial artery;  Surgeon: Chuck Hint, MD;  Location: [facility] OR;  Service: Vascular;  Laterality: Right;   BIOPSY  09/26/2019   Procedure: BIOPSY;  Surgeon: Malissa Hippo, MD;  [facility]: [facility] ENDO SUITE;  Service: Endoscopy;;   COLON RESECTION  1992   COLON SURGERY     COLONOSCOPY N/A 03/09/2019   Procedure: COLONOSCOPY;  Surgeon: Malissa Hippo, MD;  [facility];  Service: Endoscopy;  Laterality: N/A;   COLONOSCOPY WITH PROPOFOL N/A 06/08/2021   Procedure: COLONOSCOPY WITH PROPOFOL;  Surgeon: Dolores Frame, MD;  [facility];  Service: Gastroenterology;  Laterality: N/A;  9:05 /Patient is on dialysis Mon Wed Fri   CORONARY ANGIOPLASTY WITH STENT PLACEMENT  12/15/11   "2"   CORONARY ANGIOPLASTY WITH STENT PLACEMENT  y/2013   "1; makes total of 3" (05/02/2012)   CORONARY ARTERY BYPASS GRAFT  06/13/2012   Procedure: CORONARY ARTERY BYPASS GRAFTING (CABG);  Surgeon: Delight Ovens, MD;  Location: [facility] OR;  Service: Open Heart Surgery;  Laterality: N/A;  cabg x four;  using left internal mammary artery, and left leg greater saphenous vein harvested  endoscopically   CORONARY STENT INTERVENTION N/A 10/13/2016   Procedure: Coronary Stent Intervention;  Surgeon: Lennette Bihari, MD;  [facility]: [facility] INVAS[facility] CV LAB;  Service: Cardiovascular;  Laterality: N/A;   DIALYSIS/PERMA CATHETER REMOVAL N/A 04/18/2017   Procedure: DIALYSIS/PERMA CATHETER REMOVAL;  Surgeon: Renford Dills, MD;  Location: [facility] INVASIVE CV LAB;  Service: Cardiovascular;  Laterality: N/A;  DILATION AND CURETTAGE OF UTERUS     ENTEROSCOPY N/A 06/08/2021   Procedure: PUSH ENTEROSCOPY;  Surgeon: Dolores Frame, MD;  [facility];  Service: Gastroenterology;  Laterality: N/A;   ESOPHAGOGASTRODUODENOSCOPY  01/20/2012   Procedure: ESOPHAGOGASTRODUODENOSCOPY (EGD);  Surgeon: Meryl Dare, MD,FACG;  Location: [facility] ENDOSCOPY;  Service: Endoscopy;  Laterality: N/A;   ESOPHAGOGASTRODUODENOSCOPY N/A 03/26/2013   Procedure: ESOPHAGOGASTRODUODENOSCOPY (EGD);  Surgeon: Hilarie Fredrickson, MD;  Location: [facility] ENDOSCOPY;  Service: Endoscopy;  Laterality: N/A;   ESOPHAGOGASTRODUODENOSCOPY N/A 04/30/2015   Procedure: ESOPHAGOGASTRODUODENOSCOPY (EGD);  Surgeon: Malissa Hippo, MD;  [facility];  Service: Endoscopy;  Laterality: N/A;  1pm - moved to 10/20 @ 1:10   ESOPHAGOGASTRODUODENOSCOPY N/A 07/29/2016   Procedure: ESOPHAGOGASTRODUODENOSCOPY (EGD);  Surgeon: Ruffin Frederick, MD;  Location: [facility] ENDOSCOPY;  Service: Gastroenterology;  Laterality: N/A;  enteroscopy   ESOPHAGOGASTRODUODENOSCOPY N/A 09/26/2019   Procedure: ESOPHAGOGASTRODUODENOSCOPY (EGD);  Surgeon: Malissa Hippo, MD;  [facility];  Service: Endoscopy;  Laterality: N/A;  1250   ESOPHAGOGASTRODUODENOSCOPY (EGD) WITH PROPOFOL N/A 02/05/2021   Procedure: ESOPHAGOGASTRODUODENOSCOPY (EGD) WITH PROPOFOL;  Surgeon: Lanelle Bal, DO;  [facility];  Service: Endoscopy;  Laterality: N/A;   GIVENS CAPSULE STUDY N/A 03/07/2019   Procedure: GIVENS CAPSULE STUDY;  Surgeon: Malissa Hippo, MD;  [facility];  Service: Endoscopy;  Laterality: N/A;  7:30   GIVENS CAPSULE STUDY N/A 04/22/2021   Procedure: GIVENS CAPSULE STUDY;  Surgeon: Malissa Hippo, MD;  [facility];  Service: Endoscopy;  Laterality: N/A;  7:30   INSERTION OF DIALYSIS CATHETER N/A 10/05/2020   Procedure: ABORTED TUNNELED DIALYSIS CATHETER PLACEMENT RIGHT INTERNAL JUGULAR VEIN ;  Surgeon: Lucretia Roers, MD;  Location: [facility] ORS;  Service: General;  Laterality: N/A;   INTRAOPERATIVE TRANSESOPHAGEAL ECHOCARDIOGRAM  06/13/2012   Procedure: INTRAOPERATIVE TRANSESOPHAGEAL ECHOCARDIOGRAM;  Surgeon: Delight Ovens, MD;  Location: [facility] OR;  Service: Open Heart Surgery;  Laterality: N/A;   IR DIALY SHUNT INTRO NEEDLE/INTRACATH INITIAL W/IMG LEFT Left 10/06/2020   IR FLUORO GUIDE CV LINE RIGHT  06/17/2020   IR GENERIC HISTORICAL  07/26/2016   IR FLUORO GUIDE CV LINE RIGHT 07/26/2016 Berdine Dance, MD [facility]   IR GENERIC HISTORICAL  07/26/2016   IR US GUIDE VASC ACCESS RIGHT 07/26/2016 Berdine Dance, MD [facility]   IR GENERIC HISTORICAL  08/02/2016   IR US GUIDE VASC ACCESS RIGHT 08/02/2016 Berdine Dance, MD [facility]   IR GENERIC HISTORICAL  08/02/2016   IR FLUORO GUIDE CV LINE RIGHT 08/02/2016 Berdine Dance, MD [facility]   IR RADIOLOGY PERIPHERAL GUIDED IV START  03/28/2017   IR REMOVAL TUN CV CATH W/O FL  08/11/2020   IR THROMBECTOMY AV FISTULA W/THROMBOLYSIS INC/SHUNT/IMG LEFT Left 06/17/2020   IR US GUIDE VASC ACCESS LEFT  06/17/2020   IR US GUIDE VASC ACCESS RIGHT  03/28/2017   IR US GUIDE VASC ACCESS RIGHT  06/17/2020   LEFT HEART CATH AND CORONARY ANGIOGRAPHY N/A 09/20/2016   Procedure: Left Heart Cath and Coronary Angiography;  Surgeon: Lyn Records, MD;  Location: [facility] INVASIVE CV LAB;  Service: Cardiovascular;  Laterality: N/A;   LEFT HEART CATH AND CORS/GRAFTS ANGIOGRAPHY N/A 10/13/2016   Procedure: Left Heart Cath and Cors/Grafts Angiography;  Surgeon: Lennette Bihari, MD;   [facility] LAB;  Service: Cardiovascular;  Laterality: N/A;   LEFT HEART CATH AND CORS/GRAFTS ANGIOGRAPHY N/A 07/13/2018   Procedure: LEFT  HEART CATH AND CORS/GRAFTS ANGIOGRAPHY;  Surgeon: Swaziland, Peter M, MD;  Location: [facility] INVASIVE CV LAB;  Service: Cardiovascular;  Laterality: N/A;   LEFT HEART CATH AND CORS/GRAFTS ANGIOGRAPHY N/A 07/22/2021   Procedure: LEFT HEART CATH AND CORS/GRAFTS ANGIOGRAPHY;  Surgeon: Runell Gess, MD;  [facility]: [facility]VAS[facility] CV LAB;  Service: Cardiovascular;  Laterality: N/A;   LEFT HEART CATHETERIZATION WITH CORONARY ANGIOGRAM N/A 12/15/2011   Procedure: LEFT HEART CATHETERIZATION WITH CORONARY ANGIOGRAM;  Surgeon: Kathleene Hazel, MD;  Location: [facility] CATH LAB;  Service: Cardiovascular;  Laterality: N/A;   LEFT HEART CATHETERIZATION WITH CORONARY ANGIOGRAM N/A 01/10/2012   Procedure: LEFT HEART CATHETERIZATION WITH CORONARY ANGIOGRAM;  Surgeon: Peter M Swaziland, MD;  Location: [facility] CATH LAB;  Service: Cardiovascular;  Laterality: N/A;   LEFT HEART CATHETERIZATION WITH CORONARY ANGIOGRAM N/A 06/08/2012   Procedure: LEFT HEART CATHETERIZATION WITH CORONARY ANGIOGRAM;  Surgeon: Kathleene Hazel, MD;  Location: [facility] CATH LAB;  Service: Cardiovascular;  Laterality: N/A;   LEFT HEART CATHETERIZATION WITH CORONARY/GRAFT ANGIOGRAM N/A 12/10/2013   Procedure: LEFT HEART CATHETERIZATION WITH Isabel Caprice;  Surgeon: Corky Crafts, MD;  Location: [facility] CATH LAB;  Service: Cardiovascular;  Laterality: N/A;   OVARY SURGERY     ovarian cancer   POLYPECTOMY  03/09/2019   Procedure: POLYPECTOMY;  Surgeon: Malissa Hippo, MD;  [facility];  Service: Endoscopy;;  cecal    POLYPECTOMY N/A 09/26/2019   Procedure: DUODENAL POLYPECTOMY;  Surgeon: Malissa Hippo, MD;  [facility];  Service: Endoscopy;  Laterality: N/A;   REVISION OF ARTERIOVENOUS GORETEX GRAFT N/A 02/24/2017   Procedure: REVISION OF ARTERIOVENOUS GORETEX GRAFT  (RESECTION);  Surgeon: Renford Dills, MD;  Location: [facility] ORS;  Service: Vascular;  Laterality: N/A;   REVISON OF ARTERIOVENOUS FISTULA Left 06/19/2020   Procedure: REVISION OF LEFT UPPER ARM AV GRAFT WITH INTERPOSITION JUMP GRAFT USING GORE LIMB;  Surgeon: Cephus Shelling, MD;  Location: [facility] OR;  Service: Vascular;  Laterality: Left;   SHUNTOGRAM N/A 10/15/2013   Procedure: Fistulogram;  Surgeon: Nada Libman, MD;  Location: [facility] CATH LAB;  Service: Cardiovascular;  Laterality: N/A;   THROMBECTOMY / ARTERIOVENOUS GRAFT REVISION  2011   left upper arm   TUBAL LIGATION  1980's   UPPER EXTREMITY ANGIOGRAPHY Bilateral 12/06/2016   Procedure: Upper Extremity Angiography;  Surgeon: Renford Dills, MD;  Location: [facility] INVASIVE CV LAB;  Service: Cardiovascular;  Laterality: Bilateral;   UPPER EXTREMITY INTERVENTION Left 06/06/2017   Procedure: UPPER EXTREMITY INTERVENTION;  Surgeon: Renford Dills, MD;  Location: [facility] INVASIVE CV LAB;  Service: Cardiovascular;  Laterality: Left;    Current Medications: Current Meds  Medication Sig   amiodarone (PACERONE) 100 MG tablet Take 1 tablet (100 mg total) by mouth daily.   Aspirin 81 MG CAPS Take 1 capsule by mouth daily at 12 noon.   benzonatate (TESSALON) 100 MG capsule Take 100 mg by mouth 3 (three) times daily as needed for cough.   cyclobenzaprine (FLEXERIL) 5 MG tablet Take 5 mg by mouth 3 (three) times daily as needed for muscle spasms.   folic acid (FOLVITE) 1 MG tablet Take 1 mg by mouth daily.   hydrOXYzine (ATARAX) 25 MG tablet Take 25 mg by mouth every 6 (six) hours as needed for itching.   levothyroxine (SYNTHROID) 50 MCG tablet Take 1 tablet (50 mcg total) by mouth daily before breakfast.   metoprolol tartrate (LOPRESSOR) 25 MG tablet Take 0.5 tablets (12.5 mg total)  by mouth 2 (two) times daily.   multivitamin (RENA-VIT) TABS tablet Take 1 tablet by mouth daily.   nitroGLYCERIN (NITROSTAT) 0.4 MG SL tablet Place 0.4 mg  under the tongue every 5 (five) minutes as needed for chest pain.   omeprazole (PRILOSEC) 20 MG capsule Take 2 capsules (40 mg total) by mouth daily as needed (acid reflux).   ondansetron (ZOFRAN-ODT) 4 MG disintegrating tablet Take 4 mg by mouth every 8 (eight) hours as needed for nausea or vomiting.   rosuvastatin (CRESTOR) 10 MG tablet Take 1 tablet by mouth once daily   sevelamer carbonate (RENVELA) 800 MG tablet Take 2,400 mg by mouth 3 (three) times daily.   [DISCONTINUED] amoxicillin (AMOXIL) 500 MG capsule Take 2 capsules (1,000 mg total) by mouth 3 (three) times daily.   [DISCONTINUED] doxycycline (VIBRAMYCIN) 100 MG capsule Take 1 capsule (100 mg total) by mouth 2 (two) times daily.      Allergies:   Amlodipine, Aspirin, Nitrofurantoin, Bactrim [sulfamethoxazole-trimethoprim], Contrast media [iodinated contrast media], Iron, Tylenol [acetaminophen], Gabapentin, Iron sucrose, Ranexa [ranolazine], Sucroferric oxyhydroxide, Dexilant [dexlansoprazole], Hydralazine, Levaquin [levofloxacin in d5w], Morphine and related, Pantoprazole, Plavix [clopidogrel bisulfate], Protonix [pantoprazole sodium], and Venofer [ferric oxide]   Social History   Socioeconomic History   Marital status: Married    Spouse name: Valada Ankeney   Number of children: 1   Years of education: Not on file   Highest education level: GED or equivalent  Occupational History   Occupation: retired- Architectural technologist- sewed  Tobacco Use   Smoking status: Never   Smokeless tobacco: Never  Vaping Use   Vaping Use: Never used  Substance and Sexual Activity   Alcohol use: No    Alcohol/week: 0.0 standard drinks of alcohol   Drug use: No   Sexual activity: Not Currently    Birth control/protection: Surgical  Other Topics Concern   Not on file  Social History Narrative   Lives in [place], [place] with husband.  Dialysis pt - [facility].   Social Determinants of Health   Financial Resource Strain: Low Risk  (12/14/2018)    Overall Financial Resource Strain (CARDIA)    Difficulty of Paying Living Expenses: Not very hard  Food Insecurity: No Food Insecurity (04/21/2022)   Hunger Vital Sign    Worried About Running Out of Food in the Last Year: Never true    Ran Out of Food in the Last Year: Never true  Transportation Needs: No Transportation Needs (04/21/2022)   PRAPARE - Administrator, Civil Service (Medical): No    Lack of Transportation (Non-Medical): No  Physical Activity: Unknown (12/14/2018)   Exercise Vital Sign    Days of Exercise per Week: Patient refused    Minutes of Exercise per Session: Patient refused  Stress: No Stress Concern Present (12/14/2018)   [employer] of Occupational Health - Occupational Stress Questionnaire    Feeling of Stress : Only a little  Social Connections: Unknown (12/14/2018)   Social Connection and Isolation Panel [NHANES]    Frequency of Communication with Friends and Family: Patient refused    Frequency of Social Gatherings with Friends and Family: Patient refused    Attends Religious Services: Patient refused    Database administrator or Organizations: Patient refused    Attends Banker Meetings: Patient refused    Marital Status: Patient refused     Family History:  The patient's family history includes Colon cancer (age of onset: 20) in her child; Diabetes in her  brother, father, mother, and sister; Heart attack in her brother, brother, and mother; Heart disease in her father and mother; Hyperlipidemia in her brother, father, and mother; Hypertension in her father, mother, sister, and sister; Other in an other family member. There is no history of Esophageal cancer, Liver disease, Kidney disease, or Colon polyps.  ROS:   Please see the history of present illness.  All other systems are reviewed and otherwise negative.    EKG(s)/Additional Labs   EKG:  EKG is not ordered today but reviewed from prior tracing - NSR 79bpm, LBBB  nonspecific STTW changes. QTC hand calculated at .  Recent Labs: 11/08/2021: B Natriuretic Peptide 1,391.8 04/23/2022: TSH 4.796 04/24/2022: Magnesium 1.8 06/10/2022: ALT 18 06/17/2022: BUN 35; Creatinine, Ser 6.32; Hemoglobin 9.3; Platelets 281; Potassium 3.0; Sodium 140  Recent Lipid Panel    Component Value Date/Time   CHOL 81 10/19/2021 0008   TRIG 46 10/19/2021 0008   HDL 37 (L) 10/19/2021 0008   CHOLHDL 2.2 10/19/2021 0008   VLDL 9 10/19/2021 0008   LDLCALC 35 10/19/2021 0008    PHYSICAL EXAM:    VS:  BP (!) 134/54   Pulse 60   Ht 5\' 2"  (1.575 m)   SpO2 94%   BMI 23.63 kg/m   BMI: Body mass index is 23.63 kg/m.  GEN: Well nourished, well developed female in no acute distress in wheelchair HEENT: normocephalic, atraumatic Neck: no JVD, carotid bruits, or masses Cardiac: RRR; no murmurs, rubs, or gallops, trivial b/l shin edema  Respiratory:  clear to auscultation bilaterally, normal work of breathing GI: soft, nontender, nondistended, + BS MS: no deformity or atrophy Skin: warm and dry, no rash Neuro:  Alert and Oriented x 3, Strength and sensation are intact, follows commands Psych: euthymic mood, full affect  Wt Readings from Last 3 Encounters:  06/17/22 129 lb 3 oz (58.6 kg)  06/12/22 129 lb 3 oz (58.6 kg)  04/25/22 128 lb 4.9 oz (58.2 kg)     ASSESSMENT & PLAN:   1. CAD -  no accelerating anginal symptoms. Continued medical therapy is appropriate. Continue ASA as long as we are able. If she has recurrent bleeding issues, may need to discontinue permanently but will defer to her medicine team to help guide this since they are following her anemia. She will continue low dose metoprolol and rosuvastatin. Max dose of rosuvastatin while on HD is 10mg  daily so no change made today.  2. Chronic HFrEF - volume is managed by HD. Lungs clear today. Trivial edema on exam suspect partially dependent from wheelchair. Would continue metoprolol, shorter acting is  appropriate given that she is a dialysis patient. No ACEI/ARB/ARNI/spironolactone due to CKD. BP is acceptable today. This is otherwise followed closely in the dialysis setting.  3. Mitral stenosis, mitral regurgitation, tricuspid regurgitation - last echo 07/2021 with EF 30-35%, G2 DD, moderately reduced RVSF, mildly elevated PASP, severe LAE, mild RAE, moderate MR, mild-moderate MS, mild-moderate TR, aortic sclerosis without stenosis. Given clinical stability from cardiac standpoint would hold off on re-ordering echo at this time - she is a poor candidate for interventional strategies given her ESRD and recurrent bleeding. Will also send this as FYI to Dr. Wyline Mood in case he would feel strongly otherwise. TIming if/when of repeat study can be revisited in follow-up. If she develops progressive cardiac symptoms in the future, would recommend to repeat.  4. PAF/flutter - maintaining NSR on exam. Last EKG NSR as well. She has been deemed not a  candidate for anticoagulation as above. Has continued to require episodic transfusions. She would be poor Watchman candidate as well given ESRD and comorbidities, inability to use blood thinners for 6 months. She is maintaining NSR on amiodarone and metoprolol. Dose of amiodarone previously reduced to 100mg  daily for bradycardia. As this does not typically come in a 100mg  tablet (hard to find/has to be ordered), asked patient/husband to double check their bottles at home and call us if her prescription is different than what is listed. Her QTC is borderline abnormal but also has underlying LBBB which is contributing. This is stable compared to prior tracings. Given her comorbidities she is unlikely to tolerate recurrent afib/flutter well therefore reasonable to continue. Not a candidate for Tikosyn due to ESRD and 1c agents due to underlying cardiac disease. Overall she seems stable in this regard. Will ask for TSH+free T4 with labs at HD as well as updated 2V CXR given  ongoing amiodarone use.  5. Carotid artery disease - noted in 2016. No recurrent stroke sx. As she is a poor interventional candidate, we will follow conservatively at this time.     Disposition: F/u with Dr. Wyline Mood in 6 months.   Medication Adjustments/Labs and Tests Ordered: Current medicines are reviewed at length with the patient today.  Concerns regarding medicines are outlined above. Medication changes, Labs and Tests ordered today are summarized above and listed in the Patient Instructions accessible in Encounters.    Signed, Laurann Montana, PA-C  07/14/2022 1:26 PM    [facility] - [place] Location in [facility] [address] [place], [place] [address] Ph: [phone]; Fax [phone]

## 2022-07-14 ENCOUNTER — Ambulatory Visit: Payer: Medicare HMO | Attending: Physician Assistant | Admitting: Physician Assistant

## 2022-07-14 ENCOUNTER — Ambulatory Visit (HOSPITAL_COMMUNITY)
Admission: RE | Admit: 2022-07-14 | Discharge: 2022-07-14 | Disposition: A | Discharge status: Home / Self Care | Payer: Medicare HMO | Source: Ambulatory Visit | Attending: Physician Assistant | Admitting: Physician Assistant

## 2022-07-14 ENCOUNTER — Encounter: Payer: Self-pay | Admitting: Physician Assistant

## 2022-07-14 VITALS — BP 134/54 | HR 60 | Ht 62.0 in

## 2022-07-14 DIAGNOSIS — I5022 Chronic systolic (congestive) heart failure: Secondary | ICD-10-CM

## 2022-07-14 DIAGNOSIS — Z5181 Encounter for therapeutic drug level monitoring: Secondary | ICD-10-CM

## 2022-07-14 DIAGNOSIS — I342 Nonrheumatic mitral (valve) stenosis: Secondary | ICD-10-CM | POA: Diagnosis not present

## 2022-07-14 DIAGNOSIS — I480 Paroxysmal atrial fibrillation: Secondary | ICD-10-CM

## 2022-07-14 DIAGNOSIS — I4892 Unspecified atrial flutter: Secondary | ICD-10-CM

## 2022-07-14 DIAGNOSIS — I050 Rheumatic mitral stenosis: Secondary | ICD-10-CM | POA: Diagnosis not present

## 2022-07-14 DIAGNOSIS — I361 Nonrheumatic tricuspid (valve) insufficiency: Secondary | ICD-10-CM

## 2022-07-14 DIAGNOSIS — I2510 Atherosclerotic heart disease of native coronary artery without angina pectoris: Secondary | ICD-10-CM | POA: Diagnosis not present

## 2022-07-14 DIAGNOSIS — Z79899 Other long term (current) drug therapy: Secondary | ICD-10-CM | POA: Diagnosis present

## 2022-07-14 NOTE — Patient Instructions (Addendum)
Medication Instructions:  Your physician recommends that you continue on your current medications as directed. Please refer to the Current Medication list given to you today.   Please double check your amiodarone dose when you get home and call us if the prescription is any different than what is listed.   Labwork: None Today  Testing/Procedures: A chest x-ray takes a picture of the organs and structures inside the chest, including the heart, lungs, and blood vessels. This test can show several things, including, whether the heart is enlarges; whether fluid is building up in the lungs; and whether pacemaker / defibrillator leads are still in place.   Follow-Up: Follow up with Dr. Harl Bowie in 6 months.   Any Other Special Instructions Will Be Listed Below (If Applicable).     If you need a refill on your cardiac medications before your next appointment, please call your pharmacy.

## 2022-07-14 NOTE — Addendum Note (Signed)
Addended by: Christella Scheuermann C on: 07/14/2022 02:45 PM   Modules accepted: Orders

## 2022-07-21 ENCOUNTER — Other Ambulatory Visit (INDEPENDENT_AMBULATORY_CARE_PROVIDER_SITE_OTHER): Payer: Self-pay | Admitting: Vascular Surgery

## 2022-07-21 DIAGNOSIS — N186 End stage renal disease: Secondary | ICD-10-CM

## 2022-07-28 ENCOUNTER — Ambulatory Visit (INDEPENDENT_AMBULATORY_CARE_PROVIDER_SITE_OTHER): Payer: Medicare HMO

## 2022-07-28 ENCOUNTER — Ambulatory Visit (INDEPENDENT_AMBULATORY_CARE_PROVIDER_SITE_OTHER): Payer: Medicare HMO | Admitting: Nurse Practitioner

## 2022-07-28 ENCOUNTER — Encounter (INDEPENDENT_AMBULATORY_CARE_PROVIDER_SITE_OTHER): Payer: Self-pay | Admitting: Nurse Practitioner

## 2022-07-28 VITALS — BP 175/73 | HR 61 | Resp 16

## 2022-07-28 DIAGNOSIS — E785 Hyperlipidemia, unspecified: Secondary | ICD-10-CM | POA: Diagnosis not present

## 2022-07-28 DIAGNOSIS — N186 End stage renal disease: Secondary | ICD-10-CM | POA: Diagnosis not present

## 2022-07-28 DIAGNOSIS — I10 Essential (primary) hypertension: Secondary | ICD-10-CM | POA: Diagnosis not present

## 2022-07-28 DIAGNOSIS — Z992 Dependence on renal dialysis: Secondary | ICD-10-CM | POA: Diagnosis not present

## 2022-07-29 ENCOUNTER — Emergency Department (HOSPITAL_COMMUNITY): Payer: Medicare HMO

## 2022-07-29 ENCOUNTER — Other Ambulatory Visit: Payer: Self-pay

## 2022-07-29 ENCOUNTER — Emergency Department (HOSPITAL_COMMUNITY)
Admission: EM | Admit: 2022-07-29 | Discharge: 2022-07-29 | Disposition: A | Discharge status: Home / Self Care | Payer: Medicare HMO | Attending: Emergency Medicine | Admitting: Emergency Medicine

## 2022-07-29 ENCOUNTER — Encounter (HOSPITAL_COMMUNITY): Payer: Self-pay

## 2022-07-29 DIAGNOSIS — Z7982 Long term (current) use of aspirin: Secondary | ICD-10-CM | POA: Insufficient documentation

## 2022-07-29 DIAGNOSIS — M5481 Occipital neuralgia: Secondary | ICD-10-CM | POA: Insufficient documentation

## 2022-07-29 DIAGNOSIS — R519 Headache, unspecified: Secondary | ICD-10-CM | POA: Diagnosis present

## 2022-07-29 MED ORDER — BETAMETHASONE SOD PHOS & ACET 6 (3-3) MG/ML IJ SUSP
6.0000 mg | Freq: Once | INTRAMUSCULAR | Status: AC
  Administered 2022-07-29: 6 mg via INTRALESIONAL
  Filled 2022-07-29: qty 5

## 2022-07-29 MED ORDER — BUPIVACAINE HCL (PF) 0.25 % IJ SOLN
5.0000 mL | Freq: Once | INTRAMUSCULAR | Status: AC
  Administered 2022-07-29: 5 mL
  Filled 2022-07-29: qty 30

## 2022-07-29 NOTE — ED Notes (Signed)
AVS provided to and discussed with patient and family member at bedside. Pt verbalizes understanding of discharge instructions and denies any questions or concerns at this time. Pt has ride home. Pt taken out of department via W/C.

## 2022-07-29 NOTE — ED Provider Notes (Signed)
Normal Provider Note   CSN: [id number] Arrival date & time: 07/29/22  1315     History  Chief Complaint  Patient presents with   Headache    Shawna [name] is a 82 y.o. female.who presents with cc of R sided headache.    The history is provided by the patient and the spouse.  Headache Pain location:  Occipital (R occiptal radiates around the r scalp) Radiates to:  R neck Severity currently:  9/10 Severity at highest:  9/10 Onset quality:  Gradual Duration:  1 week Timing:  Constant Progression:  Worsening Chronicity:  New Relieved by:  Nothing Worsened by:  Neck movement Ineffective treatments:  Acetaminophen and cold packs Associated symptoms: neck pain   Associated symptoms: no dizziness, no eye pain, no nausea, no neck stiffness, no numbness, no photophobia, no sore throat, no swollen glands, no syncope, no tingling, no URI, no visual change and no vomiting        Home Medications Prior to Admission medications   Medication Sig Start Date End Date Taking? Authorizing Provider  ALPRAZolam Duanne Moron) 0.5 MG tablet Take 0.5 mg by mouth daily as needed. 07/26/22  Yes [provider]  amiodarone (PACERONE) 100 MG tablet Take 1 tablet (100 mg total) by mouth daily. 04/27/22  Yes Nita Sells, MD  Aspirin 81 MG CAPS Take 1 capsule by mouth daily at 12 noon.   Yes [provider]  benzonatate (TESSALON) 100 MG capsule Take 100 mg by mouth 3 (three) times daily as needed for cough. 05/10/22  Yes [provider]  cyclobenzaprine (FLEXERIL) 5 MG tablet Take 5 mg by mouth 3 (three) times daily as needed for muscle spasms. 12/27/21  Yes [provider]  folic acid (FOLVITE) 1 MG tablet Take 1 mg by mouth daily. 05/24/22  Yes [provider]  hydrOXYzine (ATARAX) 25 MG tablet Take 25 mg by mouth every 6 (six) hours as needed for itching. 06/06/22  Yes [provider]   levothyroxine (SYNTHROID) 50 MCG tablet Take 1 tablet (50 mcg total) by mouth daily before breakfast. 06/12/22  Yes Aline August, MD  metoprolol tartrate (LOPRESSOR) 25 MG tablet Take 0.5 tablets (12.5 mg total) by mouth 2 (two) times daily. 03/04/22  Yes BranchAlphonse Guild, MD  multivitamin (RENA-VIT) TABS tablet Take 1 tablet by mouth daily.   Yes [provider]  nitroGLYCERIN (NITROSTAT) 0.4 MG SL tablet Place 0.4 mg under the tongue every 5 (five) minutes as needed for chest pain.   Yes [provider]  omeprazole (PRILOSEC) 20 MG capsule Take 2 capsules (40 mg total) by mouth daily as needed (acid reflux). 06/12/22  Yes Aline August, MD  ondansetron (ZOFRAN-ODT) 4 MG disintegrating tablet Take 4 mg by mouth every 8 (eight) hours as needed for nausea or vomiting. 05/05/22  Yes [provider]  rosuvastatin (CRESTOR) 10 MG tablet Take 1 tablet by mouth once daily 06/06/22  Yes Strader, Sultana, PA-C  sevelamer carbonate (RENVELA) 800 MG tablet Take 2,400 mg by mouth 3 (three) times daily. 12/27/21  Yes [provider]      Allergies    Amlodipine, Aspirin, Nitrofurantoin, Bactrim [sulfamethoxazole-trimethoprim], Contrast media [iodinated contrast media], Iron, Tylenol [acetaminophen], Gabapentin, Iron sucrose, Ranexa [ranolazine], Sucroferric oxyhydroxide, Dexilant [dexlansoprazole], Hydralazine, Levaquin [levofloxacin in d5w], Morphine and related, Pantoprazole, Plavix [clopidogrel bisulfate], Protonix [pantoprazole sodium], and Venofer [ferric oxide]    Review of Systems   Review of Systems  HENT:  Negative for sore throat.   Eyes:  Negative for photophobia and pain.  Cardiovascular:  Negative for syncope.  Gastrointestinal:  Negative for nausea and vomiting.  Musculoskeletal:  Positive for neck pain. Negative for neck stiffness.  Neurological:  Positive for headaches. Negative for dizziness and numbness.    Physical Exam Updated Vital Signs BP  (!) 180/73   Pulse (!) 57   Temp 98.2 F (36.8 C) (Oral)   Resp 14   Ht '5\' 2"'$  (1.575 m)   Wt 59 kg   SpO2 99%   BMI 23.78 kg/m  Physical Exam Vitals and nursing note reviewed.  Constitutional:      General: She is not in acute distress.    Appearance: She is well-developed. She is not diaphoretic.  HENT:     Head: Normocephalic and atraumatic.  Eyes:     General: No scleral icterus.    Conjunctiva/sclera: Conjunctivae normal.     Pupils: Pupils are equal, round, and reactive to light.     Comments: No horizontal, vertical or rotational nystagmus  Neck:     Comments: Sharp pain that radiates around the R scel[ in a rams horn fashion esp when rotating to the R.  TTP Lesser> greater occipital nerve  No nuchal rigidity or meningeal signs Cardiovascular:     Rate and Rhythm: Normal rate and regular rhythm.  Pulmonary:     Effort: Pulmonary effort is normal. No respiratory distress.     Breath sounds: Normal breath sounds. No wheezing or rales.  Abdominal:     General: Bowel sounds are normal.     Palpations: Abdomen is soft.     Tenderness: There is no abdominal tenderness. There is no guarding or rebound.  Musculoskeletal:        General: Normal range of motion.     Cervical back: Normal range of motion and neck supple.  Lymphadenopathy:     Cervical: No cervical adenopathy.  Skin:    General: Skin is warm and dry.     Findings: No rash.  Neurological:     Mental Status: She is alert and oriented to person, place, and time.     Cranial Nerves: No cranial nerve deficit.     Motor: No abnormal muscle tone.     Coordination: Coordination normal.     Deep Tendon Reflexes: Reflexes are normal and symmetric.     Comments: Mental Status:  Alert, oriented, thought content appropriate. Speech fluent without evidence of aphasia. Able to follow 2 step commands without difficulty.  Cranial Nerves:  II:  Peripheral visual fields grossly normal, pupils equal, round, reactive to  light III,IV, VI: ptosis not present, extra-ocular motions intact bilaterally  V,VII: smile symmetric, facial light touch sensation equal VIII: hearing grossly normal bilaterally  IX,X: midline uvula rise  XI: bilateral shoulder shrug equal and strong XII: midline tongue extension  Motor:  5/5 in upper and lower extremities bilaterally including strong and equal grip strength and dorsiflexion/plantar flexion Sensory: Pinprick and light touch normal in all extremities.  Deep Tendon Reflexes: 2+ and symmetric  Cerebellar: normal finger-to-nose with bilateral upper extremities Gait: normal gait and balance CV: distal pulses palpable throughout   Psychiatric:        Behavior: Behavior normal.        Thought Content: Thought content normal.        Judgment: Judgment normal.     ED Results / Procedures / Treatments   Labs (all labs ordered are listed, but only  abnormal results are displayed) Labs Reviewed - No data to display  EKG None  Radiology CT Head Wo Contrast  Result Date: 07/29/2022 CLINICAL DATA:  Chronic headache and neck pain.  Neck stiffness. EXAM: CT HEAD WITHOUT CONTRAST CT CERVICAL SPINE WITHOUT CONTRAST TECHNIQUE: Multidetector CT imaging of the head and cervical spine was performed following the standard protocol without intravenous contrast. Multiplanar CT image reconstructions of the cervical spine were also generated. RADIATION DOSE REDUCTION: This exam was performed according to the departmental dose-optimization program which includes automated exposure control, adjustment of the mA and/or kV according to patient size and/or use of iterative reconstruction technique. COMPARISON:  CT head and MR brain dated April 23, 2022. MRI cervical spine dated April 24, 2022. FINDINGS: CT HEAD FINDINGS Brain: No evidence of acute infarction, hemorrhage, hydrocephalus, extra-axial collection or mass lesion/mass effect. Stable atrophy and chronic microvascular ischemic changes.  Vascular: Calcified atherosclerosis at the skull base. No hyperdense vessel. Skull: Normal. Negative for fracture or focal lesion. Sinuses/Orbits: No acute finding. Other: None. CT CERVICAL SPINE FINDINGS Alignment: Normal. Skull base and vertebrae: No acute fracture. No primary bone lesion or focal pathologic process. Soft tissues and spinal canal: No prevertebral fluid or swelling. No visible canal hematoma. Disc levels: Moderate disc height loss at C6-C7. Scattered mild facet arthropathy throughout the cervical spine. Upper chest: Negative. Other: None. IMPRESSION: 1. No acute intracranial abnormality. Stable atrophy and chronic microvascular ischemic changes. 2. No acute cervical spine fracture or traumatic malalignment. 3. Moderate degenerative disc disease at C6-C7. Electronically Signed   By: Titus Dubin M.D.   On: 07/29/2022 16:39   CT Cervical Spine Wo Contrast  Result Date: 07/29/2022 CLINICAL DATA:  Chronic headache and neck pain.  Neck stiffness. EXAM: CT HEAD WITHOUT CONTRAST CT CERVICAL SPINE WITHOUT CONTRAST TECHNIQUE: Multidetector CT imaging of the head and cervical spine was performed following the standard protocol without intravenous contrast. Multiplanar CT image reconstructions of the cervical spine were also generated. RADIATION DOSE REDUCTION: This exam was performed according to the departmental dose-optimization program which includes automated exposure control, adjustment of the mA and/or kV according to patient size and/or use of iterative reconstruction technique. COMPARISON:  CT head and MR brain dated April 23, 2022. MRI cervical spine dated April 24, 2022. FINDINGS: CT HEAD FINDINGS Brain: No evidence of acute infarction, hemorrhage, hydrocephalus, extra-axial collection or mass lesion/mass effect. Stable atrophy and chronic microvascular ischemic changes. Vascular: Calcified atherosclerosis at the skull base. No hyperdense vessel. Skull: Normal. Negative for fracture or  focal lesion. Sinuses/Orbits: No acute finding. Other: None. CT CERVICAL SPINE FINDINGS Alignment: Normal. Skull base and vertebrae: No acute fracture. No primary bone lesion or focal pathologic process. Soft tissues and spinal canal: No prevertebral fluid or swelling. No visible canal hematoma. Disc levels: Moderate disc height loss at C6-C7. Scattered mild facet arthropathy throughout the cervical spine. Upper chest: Negative. Other: None. IMPRESSION: 1. No acute intracranial abnormality. Stable atrophy and chronic microvascular ischemic changes. 2. No acute cervical spine fracture or traumatic malalignment. 3. Moderate degenerative disc disease at C6-C7. Electronically Signed   By: Titus Dubin M.D.   On: 07/29/2022 16:39    Procedures .Nerve Block  Date/Time: 07/30/2022 6:57 PM  Performed by: Margarita Mail, PA-C Authorized by: Margarita Mail, PA-C   Consent:    Consent obtained:  Verbal   Consent given by:  Patient   Risks discussed:  Allergic reaction, bleeding, intravenous injection, pain, unsuccessful block, swelling and infection Universal protocol:    Patient  identity confirmed:  Arm band Indications:    Indications:  Pain relief Location:    Body area:  Head   Head nerve:  Lesser occipital   Laterality:  Right Pre-procedure details:    Skin preparation:  Chlorhexidine   Preparation: Patient was prepped and draped in usual sterile fashion   Procedure details:    Block needle gauge:  25 G   Anesthetic injected:  Bupivacaine 0.25% w/o epi   Steroid injected:  Betamethasone   Injection procedure:  Incremental injection, negative aspiration for blood and anatomic landmarks palpated Post-procedure details:    Outcome:  Pain improved   Procedure completion:  Tolerated well, no immediate complications     Medications Ordered in ED Medications  bupivacaine (PF) (MARCAINE) 0.25 % injection 5 mL (5 mLs Infiltration Given by Other 07/29/22 1702)  betamethasone  acetate-betamethasone sodium phosphate (CELESTONE) injection 6 mg (6 mg Intra-Lesional Given by Other 07/29/22 1703)    ED Course/ Medical Decision Making/ A&P                             Medical Decision Making Patient here with bad headache. Findings consistent with occipital neuralgia Nerve block given with sig improvement in pain and ROM Patient mised dialysis today, but she has no swelling, sob and is lying flat in the gurney, They will call her clinic to see if she can get in tomorrow. No evidence of shingles outbreak  JAMAIYAH PYLE presents with headache Given the large differential diagnosis for [name], the decision making in this case is of high complexity.  After evaluating all of the data points in this case, the presentation of Janai K Kelliher is NOT consistent with skull fracture, meningitis/encephalitis, SAH/sentinel bleed, Intracranial Hemorrhage (ICH) (subdural/epidural), acute obstructive hydrocephalus, space occupying lesions, CVA, CO Poisoning, Basilar/vertebral artery dissection, preeclampsia, cerebral venous thrombosis, hypertensive emergency, temporal Arteritis, Idiopathic Intracranial Hypertension (pseudotumor cerebri).  Strict return and follow-up precautions have been given by me personally or by detailed written instructions verbalized by nursing staff using the teach back method to patient/family/caregiver.  Data Reviewed/Counseling: I have reviewed the patient's vital signs, nursing notes, and other relevant tests/information. I had a detailed discussion regarding the historical points, exam findings, and any diagnostic results supporting the discharge diagnosis. I also discussed the need for outpatient follow-up and the need to return to the ED if symptoms worsen or if there are any questions or concerns that arise at hom    Amount and/or Complexity of Data Reviewed Radiology: ordered.  Risk Prescription drug management.           Final Clinical  Impression(s) / ED Diagnoses Final diagnoses:  Occipital neuralgia of right side    Rx / DC Orders ED Discharge Orders     None         Margarita Mail, PA-C 07/30/22 1902    Hayden Rasmussen, MD 08/04/22 [phone]

## 2022-07-29 NOTE — Discharge Instructions (Signed)
Call your dialysis center tomorrow to see if they can squeeze you in for your missed dialysis session today. If you are unable to dialyze or you develop significant swelling and shortness of breath please go immediately to one of the emergency departments for help. Follow-up with your primary care doctor if you continue to have this right-sided headache. Contact a health care provider if: You have a fever or chills. You continue to have numbness, weakness, or tingling after 36 hours. You have redness, swelling, or pain around your injection site. Get help right away if: You have trouble breathing. You have chest pain. You have uncontrolled pain in the area that should be numbed. You have symptoms of a reaction to the nerve block. Symptoms of a reaction include: Numb lips. Changes to vision or hearing, such as blurred vision or ringing in your ears. A metallic taste in your mouth. Increased anxiety. Dizziness. Shaking (such as tremors), twitching, or seizures. Changes in bowel or bladder control.

## 2022-07-29 NOTE — ED Triage Notes (Signed)
Pt reports headache to the back of her head and neck, says its hard to turn her head, started last Wednesday.  Pt reports she skipped dialysis today.

## 2022-08-01 ENCOUNTER — Encounter (INDEPENDENT_AMBULATORY_CARE_PROVIDER_SITE_OTHER): Payer: Self-pay | Admitting: Nurse Practitioner

## 2022-08-01 NOTE — Progress Notes (Signed)
Subjective:    Patient ID: Shawna [name], female    DOB: [birth year], 82 y.o.   MRN: [id number] Chief Complaint  Patient presents with   Follow-up    Ref [facility] consult rue [id number] need new access    Shawna [name] is an 82 year old female who returns today for evaluation of her dialysis access.  Noninvasive studies today show an occluded brachial axillary AV graft.  Based upon the patient's history I suspect this has been ongoing since prior to June 09, 2022.  On that date the patient went to the [facility] emergency department due to a bleeding AV fistula and she had a stitch placed to help stop the bleeding.  Following that she was unable to use her access.  She notes that around that time she had a PermCath placed in the left chest.  Since that time she has been utilizing that and has been working without any significant issue.  The patient also underwent vein mapping of the right upper extremity but she has heavily calcified arteries with some significant disease in the brachial artery.    Review of Systems  Cardiovascular:  Positive for leg swelling.  Neurological:  Positive for weakness.  All other systems reviewed and are negative.      Objective:   Physical Exam Vitals reviewed.  HENT:     Head: Normocephalic.  Cardiovascular:     Rate and Rhythm: Normal rate.  Pulmonary:     Effort: Pulmonary effort is normal.  Skin:    General: Skin is warm and dry.  Neurological:     Mental Status: She is alert and oriented to person, place, and time.  Psychiatric:        Mood and Affect: Mood normal.        Behavior: Behavior normal.        Thought Content: Thought content normal.        Judgment: Judgment normal.     BP (!) 175/73 (BP Location: Right Arm)   Pulse 61   Resp 16   Past Medical History:  Diagnosis Date   Acute on chronic respiratory failure with hypoxia (HCC) 10/10/2016   Anxiety    Arthritis    AVM (arteriovenous malformation) of colon    CAD (coronary  artery disease)    a. s/p CABG in 2013 b. DES to D1 in 10/2016. c. cath in 07/2018 showing patent grafts with occlusion of D1 at prior stent site and progression of PDA disease --> medical management recommended   Carotid artery disease ([place])    a. 27-25% LICA, 09/6642    Chronic anemia    Chronic bronchitis (HCC)    Chronic HFrEF (heart failure with reduced ejection fraction) ([place])    Colon cancer ([place]) 1992   Esophageal stricture    ESRD on hemodialysis ([place])    ESRD due to HTN, started dialysis 2011 and gets HD at [facility] with Dr Shawna [name] on MWF schedule.  Access is LUA AVF as of Sept 2014.    GERD (gastroesophageal reflux disease)    High cholesterol 12/2011   History of blood transfusion 07/2011; 12/2011; 01/2012 X 2; 04/2012   History of gout    History of lower GI bleeding    Hypertension    Iron deficiency anemia    Jugular vein occlusion, right (HCC)    Mitral regurgitation    a. Moderate by echo, 02/2012   Mitral valve disease    Myocardial infarction [facility])    NSVT (nonsustained  ventricular tachycardia) (HCC)    Ovarian cancer ([place]) 1992   PAF (paroxysmal atrial fibrillation) ([place])    Pneumonia ~ 2009   PUD (peptic ulcer disease)    TIA (transient ischemic attack)    Tricuspid valve disease     Social History   Socioeconomic History   Marital status: Married    Spouse name: Shawna [name]   Number of children: 1   Years of education: Not on file   Highest education level: GED or equivalent  Occupational History   Occupation: retired- Technical sales engineer- se[employer]  Tobacco Use   Smoking status: Never   Smokeless tobacco: Never  Vaping Use   Vaping Use: Never used  Substance and Sexual Activity   Alcohol use: No    Alcohol/week: 0.0 standard drinks of alcohol   Drug use: No   Sexual activity: Not Currently    Birth control/protection: Surgical  Other Topics Concern   Not on file  Social History Narrative   Lives in [place], [place] with husband.  [facility]  pt - [facility].   Social Determinants of Health   Financial Resource Strain: Low Risk  (12/14/2018)   Overall Financial Resource Strain (CARDIA)    Difficulty of Paying Living Expenses: Not very hard  Food Insecurity: No Food Insecurity (04/21/2022)   Hunger Vital Sign    Worried About Running Out of Food in the Last Year: Never true    Ran Out of Food in the Last Year: Never true  Transportation Needs: No Transportation Needs (04/21/2022)   PRAPARE - Hydrologist (Medical): No    Lack of Transportation (Non-Medical): No  Physical Activity: Unknown (12/14/2018)   Exercise Vital Sign    Days of Exercise per Week: Patient refused    Minutes of Exercise per Session: Patient refused  Stress: No Stress Concern Present (12/14/2018)   [place]    Feeling of Stress : Only a little  Social Connections: Unknown (12/14/2018)   Social Connection and Isolation Panel [NHANES]    Frequency of Communication with Friends and Family: Patient refused    Frequency of Social Gatherings with Friends and Family: Patient refused    Attends Religious Services: Patient refused    Active Member of Clubs or Organizations: Patient refused    Attends Archivist Meetings: Patient refused    Marital Status: Patient refused  Intimate Partner Violence: Not At Risk (04/21/2022)   Humiliation, Afraid, Rape, and Kick questionnaire    Fear of Current or Ex-Partner: No    Emotionally Abused: No    Physically Abused: No    Sexually Abused: No    Past Surgical History:  Procedure Laterality Date   A/V SHUNTOGRAM Left 03/19/2019   Procedure: A/V SHUNTOGRAM;  Surgeon: Shawna Cabal, MD;  Location: [place] CV [facility];  Service: Cardiovascular;  Laterality: Left;   ABDOMINAL HYSTERECTOMY  1992   APPENDECTOMY  06/1990   AV FISTULA PLACEMENT  07/2009   left upper arm   AV FISTULA PLACEMENT Right 09/06/2016   Procedure:  RIGHT FOREARM ARTERIOVENOUS (AV) GRAFT;  Surgeon: Shawna Dutch, MD;  Location: [place];  Service: Vascular;  Laterality: Right;   AV FISTULA PLACEMENT N/A 02/24/2017   Procedure: INSERTION OF ARTERIOVENOUS (AV) GORE-TEX GRAFT ARM (BRACHIAL AXILLARY);  Surgeon: Shawna Cabal, MD;  Location: [facility];  Service: Vascular;  Laterality: N/A;   [place] REMOVAL Right 09/06/2016   Procedure: REMOVAL OF Right Arm  ARTERIOVENOUS GORETEX GRAFT and Vein Patch angioplasty of brachial artery;  Surgeon: Angelia Mould, MD;  Location: [place];  Service: Vascular;  Laterality: Right;   BIOPSY  09/26/2019   Procedure: BIOPSY;  Surgeon: Rogene Houston, MD;  [facility];  Service: Endoscopy;;   COLON RESECTION  1992   COLON SURGERY     COLONOSCOPY N/A 03/09/2019   Procedure: COLONOSCOPY;  Surgeon: Rogene Houston, MD;  [facility];  Service: Endoscopy;  Laterality: N/A;   COLONOSCOPY WITH PROPOFOL N/A 06/08/2021   Procedure: COLONOSCOPY WITH PROPOFOL;  Surgeon: Harvel Quale, MD;  [facility];  Service: Gastroenterology;  Laterality: N/A;  9:05 /Patient is on dialysis Mon Wed Fri   CORONARY ANGIOPLASTY WITH STENT PLACEMENT  12/15/11   "2"   CORONARY ANGIOPLASTY WITH STENT PLACEMENT  y/2013   "1; makes total of 3" (05/02/2012)   CORONARY ARTERY BYPASS GRAFT  06/13/2012   Procedure: CORONARY ARTERY BYPASS GRAFTING (CABG);  Surgeon: Grace Isaac, MD;  Location: [employer];  Service: Open Heart Surgery;  Laterality: N/A;  cabg x four;  using left internal mammary artery, and left leg greater saphenous vein harvested endoscopically   CORONARY STENT INTERVENTION N/A 10/13/2016   Procedure: Coronary Stent Intervention;  Surgeon: Troy Sine, MD;  Location: [place] [facility];  Service: Cardiovascular;  Laterality: N/A;   DIALYSIS/PERMA CATHETER REMOVAL N/A 04/18/2017   Procedure: DIALYSIS/PERMA CATHETER REMOVAL;  Surgeon: Shawna Cabal, MD;  Location: [place]  CV LAB;  Service: Cardiovascular;  Laterality: N/A;   DILATION AND CURETTAGE OF UTERUS     ENTEROSCOPY N/A 06/08/2021   Procedure: PUSH ENTEROSCOPY;  Surgeon: Harvel Quale, MD;  [facility];  Service: Gastroenterology;  Laterality: N/A;   ESOPHAGOGASTRODUODENOSCOPY  01/20/2012   Procedure: ESOPHAGOGASTRODUODENOSCOPY (EGD);  Surgeon: Ladene Artist, MD,FACG;  Location: [facility] ENDOSCOPY;  Service: Endoscopy;  Laterality: N/A;   ESOPHAGOGASTRODUODENOSCOPY N/A 03/26/2013   Procedure: ESOPHAGOGASTRODUODENOSCOPY (EGD);  Surgeon: Irene Shipper, MD;  Location: [facility] ENDOSCOPY;  Service: Endoscopy;  Laterality: N/A;   ESOPHAGOGASTRODUODENOSCOPY N/A 04/30/2015   Procedure: ESOPHAGOGASTRODUODENOSCOPY (EGD);  Surgeon: Rogene Houston, MD;  [facility];  Service: Endoscopy;  Laterality: N/A;  1pm - moved to 10/20 @ 1:10   ESOPHAGOGASTRODUODENOSCOPY N/A 07/29/2016   Procedure: ESOPHAGOGASTRODUODENOSCOPY (EGD);  Surgeon: Manus Gunning, MD;  Location: [place];  Service: Gastroenterology;  Laterality: N/A;  enteroscopy   ESOPHAGOGASTRODUODENOSCOPY N/A 09/26/2019   Procedure: ESOPHAGOGASTRODUODENOSCOPY (EGD);  Surgeon: Rogene Houston, MD;  [facility];  Service: Endoscopy;  Laterality: N/A;  1250   ESOPHAGOGASTRODUODENOSCOPY (EGD) WITH PROPOFOL N/A 02/05/2021   Procedure: ESOPHAGOGASTRODUODENOSCOPY (EGD) WITH PROPOFOL;  Surgeon: Eloise Harman, DO;  [facility];  Service: Endoscopy;  Laterality: N/A;   GIVENS CAPSULE STUDY N/A 03/07/2019   Procedure: GIVENS CAPSULE STUDY;  Surgeon: Rogene Houston, MD;  [facility];  Service: Endoscopy;  Laterality: N/A;  7:30   GIVENS CAPSULE STUDY N/A 04/22/2021   Procedure: GIVENS CAPSULE STUDY;  Surgeon: Rogene Houston, MD;  [facility];  Service: Endoscopy;  Laterality: N/A;  7:30   INSERTION OF DIALYSIS CATHETER N/A 10/05/2020   Procedure: ABORTED TUNNELED DIALYSIS CATHETER  PLACEMENT RIGHT INTERNAL JUGULAR VEIN ;  Surgeon: Virl Cagey, MD;  [facility];  Service: General;  Laterality: N/A;   INTRAOPERATIVE TRANSESOPHAGEAL ECHOCARDIOGRAM  06/13/2012   Procedure: INTRAOPERATIVE TRANSESOPHAGEAL ECHOCARDIOGRAM;  Surgeon: Percell Miller  Maryruth Bun, MD;  Location: [place];  Service: Open Heart Surgery;  Laterality: N/A;   IR DIALY SHUNT INTRO NEEDLE/INTRACATH INITIAL W/IMG LEFT Left 10/06/2020   IR FLUORO GUIDE CV LINE RIGHT  06/17/2020   IR GENERIC HISTORICAL  07/26/2016   IR FLUORO GUIDE CV LINE RIGHT 07/26/2016 Greggory Keen, MD [facility]   IR GENERIC HISTORICAL  07/26/2016   IR US GUIDE VASC ACCESS RIGHT 07/26/2016 Greggory Keen, MD [facility]   IR GENERIC HISTORICAL  08/02/2016   IR US GUIDE VASC ACCESS RIGHT 08/02/2016 Greggory Keen, MD [facility]   IR GENERIC HISTORICAL  08/02/2016   IR FLUORO GUIDE CV LINE RIGHT 08/02/2016 Greggory Keen, MD [facility]   IR RADIOLOGY PERIPHERAL GUIDED IV START  03/28/2017   IR REMOVAL TUN CV CATH W/O FL  08/11/2020   IR THROMBECTOMY AV FISTULA W/THROMBOLYSIS INC/SHUNT/IMG LEFT Left 06/17/2020   IR US GUIDE VASC ACCESS LEFT  06/17/2020   IR US GUIDE VASC ACCESS RIGHT  03/28/2017   IR US GUIDE VASC ACCESS RIGHT  06/17/2020   LEFT HEART CATH AND CORONARY ANGIOGRAPHY N/A 09/20/2016   Procedure: Left Heart Cath and Coronary Angiography;  Surgeon: Shawna Crome, MD;  Location: [place] [facility];  Service: Cardiovascular;  Laterality: N/A;   LEFT HEART CATH AND CORS/GRAFTS ANGIOGRAPHY N/A 10/13/2016   Procedure: Left Heart Cath and Cors/Grafts Angiography;  Surgeon: Troy Sine, MD;  Location: [place] CV LAB;  Service: Cardiovascular;  Laterality: N/A;   LEFT HEART CATH AND CORS/GRAFTS ANGIOGRAPHY N/A 07/13/2018   Procedure: LEFT HEART CATH AND CORS/GRAFTS ANGIOGRAPHY;  Surgeon: Martinique, Peter M, MD;  Location: [place] [facility];  Service: Cardiovascular;  Laterality: N/A;   LEFT HEART CATH AND CORS/GRAFTS ANGIOGRAPHY N/A 07/22/2021    Procedure: LEFT HEART CATH AND CORS/GRAFTS ANGIOGRAPHY;  Surgeon: Lorretta Harp, MD;  Location: [place] [facility];  Service: Cardiovascular;  Laterality: N/A;   LEFT HEART CATHETERIZATION WITH CORONARY ANGIOGRAM N/A 12/15/2011   Procedure: LEFT HEART CATHETERIZATION WITH CORONARY ANGIOGRAM;  Surgeon: Burnell Blanks, MD;  Location: [facility] CATH LAB;  Service: Cardiovascular;  Laterality: N/A;   LEFT HEART CATHETERIZATION WITH CORONARY ANGIOGRAM N/A 01/10/2012   Procedure: LEFT HEART CATHETERIZATION WITH CORONARY ANGIOGRAM;  Surgeon: Peter M Martinique, MD;  Location: [facility] CATH LAB;  Service: Cardiovascular;  Laterality: N/A;   LEFT HEART CATHETERIZATION WITH CORONARY ANGIOGRAM N/A 06/08/2012   Procedure: LEFT HEART CATHETERIZATION WITH CORONARY ANGIOGRAM;  Surgeon: Burnell Blanks, MD;  Location: [facility] CATH LAB;  Service: Cardiovascular;  Laterality: N/A;   LEFT HEART CATHETERIZATION WITH CORONARY/GRAFT ANGIOGRAM N/A 12/10/2013   Procedure: LEFT HEART CATHETERIZATION WITH Beatrix Fetters;  Surgeon: [name], MD;  Location: [facility] CATH LAB;  Service: Cardiovascular;  Laterality: N/A;   OVARY SURGERY     ovarian cancer   POLYPECTOMY  03/09/2019   Procedure: POLYPECTOMY;  Surgeon: Rogene Houston, MD;  [facility];  Service: Endoscopy;;  cecal    POLYPECTOMY N/A 09/26/2019   Procedure: DUODENAL POLYPECTOMY;  Surgeon: Rogene Houston, MD;  [facility];  Service: Endoscopy;  Laterality: N/A;   REVISION OF ARTERIOVENOUS GORETEX GRAFT N/A 02/24/2017   Procedure: REVISION OF ARTERIOVENOUS GORETEX GRAFT (RESECTION);  Surgeon: Shawna Cabal, MD;  Location: [facility];  Service: Vascular;  Laterality: N/A;   REVISON OF ARTERIOVENOUS FISTULA Left 06/19/2020   Procedure: REVISION OF LEFT UPPER ARM AV GRAFT WITH INTERPOSITION JUMP GRAFT USING 6MM GORE LIMB;  Surgeon: Marty Heck, MD;  Location: [place];  Service: Vascular;  Laterality: Left;   SHUNTOGRAM N/A  10/15/2013   Procedure: Fistulogram;  Surgeon: Serafina Mitchell, MD;  Location: [facility] CATH LAB;  Service: Cardiovascular;  Laterality: N/A;   THROMBECTOMY / ARTERIOVENOUS GRAFT REVISION  2011   left upper arm   TUBAL LIGATION  1980's   UPPER EXTREMITY ANGIOGRAPHY Bilateral 12/06/2016   Procedure: Upper Extremity Angiography;  Surgeon: Shawna Cabal, MD;  Location: [place] [facility];  Service: Cardiovascular;  Laterality: Bilateral;   UPPER EXTREMITY INTERVENTION Left 06/06/2017   Procedure: UPPER EXTREMITY INTERVENTION;  Surgeon: Shawna Cabal, MD;  Location: [place] [facility];  Service: Cardiovascular;  Laterality: Left;    Family History  Problem Relation Age of Onset   Heart disease Mother        Heart Disease before age 50   Hyperlipidemia Mother    Hypertension Mother    Diabetes Mother    Heart attack Mother    Heart disease Father        Heart Disease before age 51   Hyperlipidemia Father    Hypertension Father    Diabetes Father    Diabetes Sister    Hypertension Sister    Diabetes Brother    Hyperlipidemia Brother    Heart attack Brother    Hypertension Sister    Heart attack Brother    Colon cancer Child 2   Other Other        noncontributory for early CAD   Esophageal cancer Neg Hx    Liver disease Neg Hx    Kidney disease Neg Hx    Colon polyps Neg Hx     Allergies  Allergen Reactions   Amlodipine Swelling   Aspirin Other (See Comments)    High Doses Mess up her stomach; "makes my bowels have blood in them". Takes 81 mg EC Aspirin    Nitrofurantoin Hives   Bactrim [Sulfamethoxazole-Trimethoprim] Rash   Contrast Media [Iodinated Contrast Media] Itching   Iron Itching and Other (See Comments)    "they gave me iron in dialysis; had to give me Benadryl cause I had to have the iron" (05/02/2012)   Tylenol [Acetaminophen] Itching and Other (See Comments)    Makes her feet on fire per pt   Gabapentin Other (See Comments)    Unknown reaction   Iron  Sucrose Other (See Comments)    Unknown   Ranexa [Ranolazine] Other (See Comments)    Myoclonus-hospitalized    Sucroferric Oxyhydroxide Other (See Comments)    Unknown   Dexilant [Dexlansoprazole] Other (See Comments)    Upset stomach   Hydralazine Itching    Has tolerated while inpatient Has tolerated while inpatient   Levaquin [Levofloxacin In D5w] Rash   Morphine And Related Itching and Other (See Comments)    Itching in feet   Pantoprazole Rash   Plavix [Clopidogrel Bisulfate] Rash   Protonix [Pantoprazole Sodium] Rash   Venofer [Ferric Oxide] Itching and Other (See Comments)    Patient reports using Benadryl prior to doses as [place] Ref Rng & Units 06/17/2022    5:45 AM 06/12/2022    5:09 AM 06/11/2022    7:19 AM  CBC  WBC 4.0 - 10.5 K/uL 12.1  9.3  9.4   Hemoglobin 12.0 - 15.0 g/dL 9.3  9.4  7.4   Hematocrit 36.0 - 46.0 % 28.6  28.1  23.6  Platelets 150 - 400 K/uL 281  200  210       CMP     Component Value Date/Time   NA 140 06/17/2022 0545   K 3.0 (L) 06/17/2022 0545   CL 104 06/17/2022 0545   CO2 25 06/17/2022 0545   GLUCOSE 175 (H) 06/17/2022 0545   BUN 35 (H) 06/17/2022 0545   CREATININE 6.32 (H) 06/17/2022 0545   CREATININE 6.57 (H) 03/05/2019 1159   CALCIUM 8.7 (L) 06/17/2022 0545   CALCIUM 8.1 (L) 05/29/2013 1814   PROT 6.5 06/10/2022 1431   ALBUMIN 2.7 (L) 06/11/2022 0719   AST 14 (L) 06/10/2022 1431   ALT 18 06/10/2022 1431   ALKPHOS 101 06/10/2022 1431   BILITOT 0.8 06/10/2022 1431   GFRNONAA 6 (L) 06/17/2022 0545   GFRNONAA 6 (L) 03/05/2019 1159   GFRAA 9 (L) 02/12/2020 1712   GFRAA 6 (L) 03/05/2019 1159     No results found.     Assessment & Plan:   1. ESRD (end stage renal disease) on dialysis [facility]) Recommend:  The patient is experiencing increasing problems with their dialysis access.  Patient should have a fistulagram with the intention for intervention.  The intention of the intervention would be to try  to restore access in her left brachial axillary AV graft.  As well as improve the quality of dialysis therapy.  Given the patient's heavy arterial calcification in the right upper extremity, this would be in less than optimal area and we will try to possibly revise the left upper extremity access if we are unable to declot.   Additionally, if the access is able to be revised into some sort of a hero graft, additional imaging will be necessary to plan this intervention that can also be done at the time of fistulogram.    The risks, benefits and alternative therapies were reviewed in detail with the patient.  All questions were answered.  The patient agrees to proceed with angio/intervention.    The patient will follow up with me in the office after the procedure.   2. Primary hypertension Continue antihypertensive medications as already ordered, these medications have been reviewed and there are no changes at this time.  3. Hyperlipidemia, unspecified hyperlipidemia type Continue statin as ordered and reviewed, no changes at this time   Current Outpatient Medications on File Prior to Visit  Medication Sig Dispense Refill   amiodarone (PACERONE) 100 MG tablet Take 1 tablet (100 mg total) by mouth daily. 30 tablet 0   Aspirin 81 MG CAPS Take 1 capsule by mouth daily at 12 noon.     benzonatate (TESSALON) 100 MG capsule Take 100 mg by mouth 3 (three) times daily as needed for cough.     cyclobenzaprine (FLEXERIL) 5 MG tablet Take 5 mg by mouth 3 (three) times daily as needed for muscle spasms.     folic acid (FOLVITE) 1 MG tablet Take 1 mg by mouth daily.     hydrOXYzine (ATARAX) 25 MG tablet Take 25 mg by mouth every 6 (six) hours as needed for itching.     levothyroxine (SYNTHROID) 50 MCG tablet Take 1 tablet (50 mcg total) by mouth daily before breakfast.     metoprolol tartrate (LOPRESSOR) 25 MG tablet Take 0.5 tablets (12.5 mg total) by mouth 2 (two) times daily. 30 tablet 6    multivitamin (RENA-VIT) TABS tablet Take 1 tablet by mouth daily.     nitroGLYCERIN (NITROSTAT) 0.4 MG SL tablet Place 0.4 mg  under the tongue every 5 (five) minutes as needed for chest pain.     omeprazole (PRILOSEC) 20 MG capsule Take 2 capsules (40 mg total) by mouth daily as needed (acid reflux).     ondansetron (ZOFRAN-ODT) 4 MG disintegrating tablet Take 4 mg by mouth every 8 (eight) hours as needed for nausea or vomiting.     rosuvastatin (CRESTOR) 10 MG tablet Take 1 tablet by mouth once daily 90 tablet 0   sevelamer carbonate (RENVELA) 800 MG tablet Take 2,400 mg by mouth 3 (three) times daily.     No current facility-administered medications on file prior to visit.    There are no Patient Instructions on file for this visit. No follow-ups on file.   Kris Hartmann, NP

## 2022-08-04 ENCOUNTER — Emergency Department (HOSPITAL_COMMUNITY)
Admission: EM | Admit: 2022-08-04 | Discharge: 2022-08-04 | Disposition: A | Discharge status: Home / Self Care | Payer: Medicare HMO | Source: Home / Self Care | Attending: Emergency Medicine | Admitting: Emergency Medicine

## 2022-08-04 ENCOUNTER — Encounter (HOSPITAL_COMMUNITY): Payer: Self-pay | Admitting: *Deleted

## 2022-08-04 ENCOUNTER — Other Ambulatory Visit: Payer: Self-pay

## 2022-08-04 DIAGNOSIS — I2510 Atherosclerotic heart disease of native coronary artery without angina pectoris: Secondary | ICD-10-CM | POA: Insufficient documentation

## 2022-08-04 DIAGNOSIS — M5481 Occipital neuralgia: Secondary | ICD-10-CM | POA: Insufficient documentation

## 2022-08-04 DIAGNOSIS — N186 End stage renal disease: Secondary | ICD-10-CM | POA: Insufficient documentation

## 2022-08-04 DIAGNOSIS — R531 Weakness: Secondary | ICD-10-CM | POA: Diagnosis not present

## 2022-08-04 DIAGNOSIS — I509 Heart failure, unspecified: Secondary | ICD-10-CM | POA: Insufficient documentation

## 2022-08-04 DIAGNOSIS — I132 Hypertensive heart and chronic kidney disease with heart failure and with stage 5 chronic kidney disease, or end stage renal disease: Secondary | ICD-10-CM | POA: Insufficient documentation

## 2022-08-04 DIAGNOSIS — Z992 Dependence on renal dialysis: Secondary | ICD-10-CM | POA: Insufficient documentation

## 2022-08-04 DIAGNOSIS — Z79899 Other long term (current) drug therapy: Secondary | ICD-10-CM | POA: Insufficient documentation

## 2022-08-04 DIAGNOSIS — R627 Adult failure to thrive: Secondary | ICD-10-CM | POA: Diagnosis not present

## 2022-08-04 DIAGNOSIS — Z7982 Long term (current) use of aspirin: Secondary | ICD-10-CM | POA: Insufficient documentation

## 2022-08-04 DIAGNOSIS — Z8673 Personal history of transient ischemic attack (TIA), and cerebral infarction without residual deficits: Secondary | ICD-10-CM | POA: Insufficient documentation

## 2022-08-04 MED ORDER — OXYCODONE HCL 5 MG PO TABS
5.0000 mg | ORAL_TABLET | Freq: Once | ORAL | Status: AC
  Administered 2022-08-04: 5 mg via ORAL
  Filled 2022-08-04: qty 1

## 2022-08-04 NOTE — ED Provider Notes (Signed)
[facility] Provider Note   CSN: [id number] Arrival date & time: 08/04/22  1750     History  Chief Complaint  Patient presents with   Neck Pain    Shawna [name] is a 82 y.o. female with history of chronic bronchitis, GERD, gout, CAD, hyperlipidemia, TIA, hypertension, ESRD on hemodialysis, paroxysmal A-fib, heart failure who presents the emergency department complaining of ongoing right-sided neck pain.  Patient was seen in the ER previously and was given a steroid injection in the area.  She states that this helped a little bit with her symptoms, but they returned.  Has not been taking anything for this at home.  Denies any new symptoms. She says the pain radiates from the right side of her neck up to the back of the right side of her head.  It feels like a shocking pain.  Made worse with turning her head to the right.   Neck Pain      Home Medications Prior to Admission medications   Medication Sig Start Date End Date Taking? Authorizing Provider  ALPRAZolam Duanne Moron) 0.5 MG tablet Take 0.5 mg by mouth daily as needed. 07/26/22   [provider]  amiodarone (PACERONE) 100 MG tablet Take 1 tablet (100 mg total) by mouth daily. 04/27/22   Nita Sells, MD  Aspirin 81 MG CAPS Take 1 capsule by mouth daily at 12 noon.    [provider]  benzonatate (TESSALON) 100 MG capsule Take 100 mg by mouth 3 (three) times daily as needed for cough. 05/10/22   [provider]  cyclobenzaprine (FLEXERIL) 5 MG tablet Take 5 mg by mouth 3 (three) times daily as needed for muscle spasms. 12/27/21   [provider]  folic acid (FOLVITE) 1 MG tablet Take 1 mg by mouth daily. 05/24/22   [provider]  hydrOXYzine (ATARAX) 25 MG tablet Take 25 mg by mouth every 6 (six) hours as needed for itching. 06/06/22   [provider]  levothyroxine (SYNTHROID) 50 MCG tablet Take 1 tablet (50 mcg total) by mouth  daily before breakfast. 06/12/22   Aline August, MD  metoprolol tartrate (LOPRESSOR) 25 MG tablet Take 0.5 tablets (12.5 mg total) by mouth 2 (two) times daily. 03/04/22   Arnoldo Lenis, MD  multivitamin (RENA-VIT) TABS tablet Take 1 tablet by mouth daily.    [provider]  nitroGLYCERIN (NITROSTAT) 0.4 MG SL tablet Place 0.4 mg under the tongue every 5 (five) minutes as needed for chest pain.    [provider]  omeprazole (PRILOSEC) 20 MG capsule Take 2 capsules (40 mg total) by mouth daily as needed (acid reflux). 06/12/22   Aline August, MD  ondansetron (ZOFRAN-ODT) 4 MG disintegrating tablet Take 4 mg by mouth every 8 (eight) hours as needed for nausea or vomiting. 05/05/22   [provider]  rosuvastatin (CRESTOR) 10 MG tablet Take 1 tablet by mouth once daily 06/06/22   Ahmed Prima, [place] M, PA-C  sevelamer carbonate (RENVELA) 800 MG tablet Take 2,400 mg by mouth 3 (three) times daily. 12/27/21   [provider]      Allergies    Amlodipine, Aspirin, Nitrofurantoin, Bactrim [sulfamethoxazole-trimethoprim], Contrast media [iodinated contrast media], Iron, Tylenol [acetaminophen], Gabapentin, Iron sucrose, Ranexa [ranolazine], Sucroferric oxyhydroxide, Dexilant [dexlansoprazole], Hydralazine, Levaquin [levofloxacin in d5w], Morphine and related, Pantoprazole, Plavix [clopidogrel bisulfate], Protonix [pantoprazole sodium], and Venofer [ferric oxide]    Review of Systems   Review of Systems  Musculoskeletal:  Positive for neck pain.  All other systems reviewed and are negative.   Physical Exam Updated Vital Signs BP (!) 163/49 (BP Location: Right Arm)   Pulse 61   Temp 98.6 F (37 C) (Oral)   Resp 14   Ht '5\' 2"'$  (1.575 m)   Wt 77.6 kg   SpO2 100%   BMI 31.28 kg/m  Physical Exam Vitals and nursing note reviewed.  Constitutional:      Appearance: Normal appearance.  HENT:     Head: Normocephalic and atraumatic.  Eyes:      Conjunctiva/sclera: Conjunctivae normal.  Neck:     Comments: No midline spinal tenderness, step-offs or crepitus.  Reproducible pain with rotation of the head to the right.  No skull deformities palpated. No meningeal signs.  Cardiovascular:     Rate and Rhythm: Normal rate and regular rhythm.  Pulmonary:     Effort: Pulmonary effort is normal. No respiratory distress.     Breath sounds: Normal breath sounds.  Abdominal:     General: There is no distension.     Palpations: Abdomen is soft.     Tenderness: There is no abdominal tenderness.  Skin:    General: Skin is warm and dry.  Neurological:     General: No focal deficit present.     Mental Status: She is alert.     Comments: Neuro: Speech is clear, able to follow commands. CN III-XII intact grossly intact. PERRLA. EOMI. Sensation intact throughout. Str 5/5 all extremities.     ED Results / Procedures / Treatments   Labs (all labs ordered are listed, but only abnormal results are displayed) Labs Reviewed - No data to display  EKG None  Radiology No results found.  Procedures Procedures    Medications Ordered in ED Medications  oxyCODONE (Oxy IR/ROXICODONE) immediate release tablet 5 mg (has no administration in time range)    ED Course/ Medical Decision Making/ A&P                             Medical Decision Making Risk Prescription drug management.  This patient is a 82 y.o. female who presents to the ED for concern of ongoing neck pain.   Differential diagnoses prior to evaluation: Trauma, meningitis, vertebral artery injury, neuralgia, muscle strain  Past Medical History / Social History / Additional history: Chart reviewed. Pertinent results include: chronic bronchitis, GERD, gout, CAD, hyperlipidemia, TIA, hypertension, ESRD on hemodialysis, paroxysmal A-fib, heart failure  Patient seen in ER on 1/19 for same symptoms. Diagnosed with occipital neuralgia and given local nerve block with steroid  injection. Patient reported improvement after this. Pain returned the following day.   Physical Exam: Physical exam performed. The pertinent findings include: Reproducible pain with right rotation of the head. Normal neurologic exam. No midline cervical tenderness, no meningeal signs.   Medications / Treatment: Given pain medication   Disposition: After consideration of the diagnostic results and the patients response to treatment, I feel that emergency department workup does not suggest an emergent condition requiring admission or immediate intervention beyond what has been performed at this time.   The plan is: discharge to home with ongoing management of likely neck pain related to neuralgia. Agree with previous provider's diagnosis, especially as patient had improvement of symptoms after nerve block/steroid injection. Don't feel it would be beneficial to perform again today. Low concern for CVA or meningitis as the cause of patient's symptoms as she is  otherwise clinically well appearing with no other symptoms, normal neurologic exam. Recommended close follow up with PCP regarding starting possibly gabapentin. Due to the patient's age and numerous co morbidities, I did not feel comfortable going ahead and starting her on such medication due to the list of side effects and requiring closer follow up. I feel she is reliable for PCP follow up. The patient is safe for discharge and has been instructed to return immediately for worsening symptoms, change in symptoms or any other concerns.  Final Clinical Impression(s) / ED Diagnoses Final diagnoses:  Occipital neuralgia of right side    Rx / DC Orders ED Discharge Orders     None      Portions of this report may have been transcribed using voice recognition software. Every effort was made to ensure accuracy; however, inadvertent computerized transcription errors may be present.    Estill Cotta 08/04/22 2255    Hayden Rasmussen, MD 08/05/22 1032

## 2022-08-04 NOTE — Discharge Instructions (Addendum)
You were seen in the emergency department for neck pain and headache.  As we discussed, I agree with your diagnosis of occipital neuralgia. This is a type of nerve pain.   There are some medicines that can help with this type of pain, and I would recommend discussing them with your primary doctor. One such medicine is called Gabapentin.   Continue to monitor how you're doing and return to the ER for new or worsening symptoms.

## 2022-08-04 NOTE — ED Triage Notes (Signed)
Pt states HA returned today and neck pain as well, hard to turn her head. Pt seen for same on 1/19. Last took tylenol last night for pain, denies taking anything today.

## 2022-08-06 ENCOUNTER — Emergency Department (HOSPITAL_COMMUNITY): Payer: Medicare HMO

## 2022-08-06 ENCOUNTER — Other Ambulatory Visit: Payer: Self-pay

## 2022-08-06 ENCOUNTER — Inpatient Hospital Stay (HOSPITAL_COMMUNITY)
Admission: EM | Admit: 2022-08-06 | Discharge: 2022-08-17 | DRG: 640 | Disposition: A | Discharge status: Skilled Nursing Facility | Payer: Medicare HMO | Attending: Internal Medicine | Admitting: Internal Medicine

## 2022-08-06 ENCOUNTER — Encounter (HOSPITAL_COMMUNITY): Payer: Self-pay | Admitting: Internal Medicine

## 2022-08-06 DIAGNOSIS — E1122 Type 2 diabetes mellitus with diabetic chronic kidney disease: Secondary | ICD-10-CM | POA: Diagnosis present

## 2022-08-06 DIAGNOSIS — Z08 Encounter for follow-up examination after completed treatment for malignant neoplasm: Secondary | ICD-10-CM | POA: Diagnosis not present

## 2022-08-06 DIAGNOSIS — I252 Old myocardial infarction: Secondary | ICD-10-CM

## 2022-08-06 DIAGNOSIS — R531 Weakness: Principal | ICD-10-CM

## 2022-08-06 DIAGNOSIS — Z85038 Personal history of other malignant neoplasm of large intestine: Secondary | ICD-10-CM

## 2022-08-06 DIAGNOSIS — Z951 Presence of aortocoronary bypass graft: Secondary | ICD-10-CM | POA: Diagnosis not present

## 2022-08-06 DIAGNOSIS — Z992 Dependence on renal dialysis: Secondary | ICD-10-CM

## 2022-08-06 DIAGNOSIS — D132 Benign neoplasm of duodenum: Secondary | ICD-10-CM | POA: Diagnosis not present

## 2022-08-06 DIAGNOSIS — K31811 Angiodysplasia of stomach and duodenum with bleeding: Secondary | ICD-10-CM | POA: Diagnosis present

## 2022-08-06 DIAGNOSIS — Z789 Other specified health status: Secondary | ICD-10-CM | POA: Diagnosis not present

## 2022-08-06 DIAGNOSIS — I132 Hypertensive heart and chronic kidney disease with heart failure and with stage 5 chronic kidney disease, or end stage renal disease: Secondary | ICD-10-CM | POA: Diagnosis present

## 2022-08-06 DIAGNOSIS — I959 Hypotension, unspecified: Secondary | ICD-10-CM | POA: Diagnosis not present

## 2022-08-06 DIAGNOSIS — Z91158 Patient's noncompliance with renal dialysis for other reason: Secondary | ICD-10-CM

## 2022-08-06 DIAGNOSIS — D62 Acute posthemorrhagic anemia: Secondary | ICD-10-CM | POA: Diagnosis present

## 2022-08-06 DIAGNOSIS — D124 Benign neoplasm of descending colon: Secondary | ICD-10-CM | POA: Diagnosis not present

## 2022-08-06 DIAGNOSIS — Z885 Allergy status to narcotic agent status: Secondary | ICD-10-CM

## 2022-08-06 DIAGNOSIS — Z83438 Family history of other disorder of lipoprotein metabolism and other lipidemia: Secondary | ICD-10-CM

## 2022-08-06 DIAGNOSIS — K449 Diaphragmatic hernia without obstruction or gangrene: Secondary | ICD-10-CM | POA: Diagnosis present

## 2022-08-06 DIAGNOSIS — R410 Disorientation, unspecified: Secondary | ICD-10-CM | POA: Diagnosis present

## 2022-08-06 DIAGNOSIS — N2581 Secondary hyperparathyroidism of renal origin: Secondary | ICD-10-CM | POA: Diagnosis present

## 2022-08-06 DIAGNOSIS — I2510 Atherosclerotic heart disease of native coronary artery without angina pectoris: Secondary | ICD-10-CM | POA: Diagnosis present

## 2022-08-06 DIAGNOSIS — K5730 Diverticulosis of large intestine without perforation or abscess without bleeding: Secondary | ICD-10-CM | POA: Diagnosis present

## 2022-08-06 DIAGNOSIS — K317 Polyp of stomach and duodenum: Secondary | ICD-10-CM | POA: Diagnosis not present

## 2022-08-06 DIAGNOSIS — Z8673 Personal history of transient ischemic attack (TIA), and cerebral infarction without residual deficits: Secondary | ICD-10-CM

## 2022-08-06 DIAGNOSIS — M5481 Occipital neuralgia: Secondary | ICD-10-CM | POA: Diagnosis present

## 2022-08-06 DIAGNOSIS — R627 Adult failure to thrive: Secondary | ICD-10-CM | POA: Diagnosis not present

## 2022-08-06 DIAGNOSIS — Z79890 Hormone replacement therapy (postmenopausal): Secondary | ICD-10-CM

## 2022-08-06 DIAGNOSIS — Z800 Family history of malignant neoplasm of digestive organs: Secondary | ICD-10-CM

## 2022-08-06 DIAGNOSIS — I482 Chronic atrial fibrillation, unspecified: Secondary | ICD-10-CM | POA: Diagnosis present

## 2022-08-06 DIAGNOSIS — I480 Paroxysmal atrial fibrillation: Secondary | ICD-10-CM | POA: Diagnosis present

## 2022-08-06 DIAGNOSIS — N186 End stage renal disease: Secondary | ICD-10-CM | POA: Diagnosis present

## 2022-08-06 DIAGNOSIS — Z955 Presence of coronary angioplasty implant and graft: Secondary | ICD-10-CM

## 2022-08-06 DIAGNOSIS — G9341 Metabolic encephalopathy: Secondary | ICD-10-CM | POA: Diagnosis present

## 2022-08-06 DIAGNOSIS — E039 Hypothyroidism, unspecified: Secondary | ICD-10-CM | POA: Diagnosis present

## 2022-08-06 DIAGNOSIS — E7800 Pure hypercholesterolemia, unspecified: Secondary | ICD-10-CM | POA: Diagnosis present

## 2022-08-06 DIAGNOSIS — R000 Tachycardia, unspecified: Secondary | ICD-10-CM | POA: Diagnosis not present

## 2022-08-06 DIAGNOSIS — D125 Benign neoplasm of sigmoid colon: Secondary | ICD-10-CM | POA: Diagnosis present

## 2022-08-06 DIAGNOSIS — Z7189 Other specified counseling: Secondary | ICD-10-CM

## 2022-08-06 DIAGNOSIS — Z8249 Family history of ischemic heart disease and other diseases of the circulatory system: Secondary | ICD-10-CM

## 2022-08-06 DIAGNOSIS — D123 Benign neoplasm of transverse colon: Secondary | ICD-10-CM | POA: Diagnosis present

## 2022-08-06 DIAGNOSIS — Z751 Person awaiting admission to adequate facility elsewhere: Secondary | ICD-10-CM

## 2022-08-06 DIAGNOSIS — Z79899 Other long term (current) drug therapy: Secondary | ICD-10-CM | POA: Diagnosis not present

## 2022-08-06 DIAGNOSIS — I4891 Unspecified atrial fibrillation: Secondary | ICD-10-CM | POA: Diagnosis present

## 2022-08-06 DIAGNOSIS — Z781 Physical restraint status: Secondary | ICD-10-CM

## 2022-08-06 DIAGNOSIS — Z833 Family history of diabetes mellitus: Secondary | ICD-10-CM

## 2022-08-06 DIAGNOSIS — D649 Anemia, unspecified: Secondary | ICD-10-CM

## 2022-08-06 DIAGNOSIS — R451 Restlessness and agitation: Secondary | ICD-10-CM | POA: Diagnosis not present

## 2022-08-06 DIAGNOSIS — K31819 Angiodysplasia of stomach and duodenum without bleeding: Secondary | ICD-10-CM | POA: Diagnosis not present

## 2022-08-06 DIAGNOSIS — Z886 Allergy status to analgesic agent status: Secondary | ICD-10-CM

## 2022-08-06 DIAGNOSIS — Z7982 Long term (current) use of aspirin: Secondary | ICD-10-CM

## 2022-08-06 DIAGNOSIS — Z711 Person with feared health complaint in whom no diagnosis is made: Secondary | ICD-10-CM | POA: Diagnosis not present

## 2022-08-06 DIAGNOSIS — Z515 Encounter for palliative care: Secondary | ICD-10-CM

## 2022-08-06 DIAGNOSIS — Z86010 Personal history of colonic polyps: Secondary | ICD-10-CM | POA: Diagnosis not present

## 2022-08-06 DIAGNOSIS — I5042 Chronic combined systolic (congestive) and diastolic (congestive) heart failure: Secondary | ICD-10-CM | POA: Diagnosis present

## 2022-08-06 DIAGNOSIS — R5381 Other malaise: Secondary | ICD-10-CM | POA: Diagnosis present

## 2022-08-06 DIAGNOSIS — Z888 Allergy status to other drugs, medicaments and biological substances status: Secondary | ICD-10-CM

## 2022-08-06 DIAGNOSIS — N189 Chronic kidney disease, unspecified: Secondary | ICD-10-CM | POA: Diagnosis present

## 2022-08-06 DIAGNOSIS — Z91041 Radiographic dye allergy status: Secondary | ICD-10-CM

## 2022-08-06 DIAGNOSIS — I120 Hypertensive chronic kidney disease with stage 5 chronic kidney disease or end stage renal disease: Secondary | ICD-10-CM | POA: Diagnosis not present

## 2022-08-06 DIAGNOSIS — D631 Anemia in chronic kidney disease: Secondary | ICD-10-CM | POA: Diagnosis present

## 2022-08-06 DIAGNOSIS — R195 Other fecal abnormalities: Secondary | ICD-10-CM | POA: Diagnosis not present

## 2022-08-06 DIAGNOSIS — Z8543 Personal history of malignant neoplasm of ovary: Secondary | ICD-10-CM

## 2022-08-06 DIAGNOSIS — F419 Anxiety disorder, unspecified: Secondary | ICD-10-CM | POA: Diagnosis present

## 2022-08-06 DIAGNOSIS — E785 Hyperlipidemia, unspecified: Secondary | ICD-10-CM | POA: Diagnosis present

## 2022-08-06 DIAGNOSIS — Z8711 Personal history of peptic ulcer disease: Secondary | ICD-10-CM

## 2022-08-06 DIAGNOSIS — Z90710 Acquired absence of both cervix and uterus: Secondary | ICD-10-CM

## 2022-08-06 DIAGNOSIS — E782 Mixed hyperlipidemia: Secondary | ICD-10-CM | POA: Diagnosis present

## 2022-08-06 DIAGNOSIS — Z6826 Body mass index (BMI) 26.0-26.9, adult: Secondary | ICD-10-CM

## 2022-08-06 LAB — PREPARE RBC (CROSSMATCH)

## 2022-08-06 LAB — COMPREHENSIVE METABOLIC PANEL
ALT: 16 U/L (ref 0–44)
AST: 11 U/L — ABNORMAL LOW (ref 15–41)
Albumin: 3 g/dL — ABNORMAL LOW (ref 3.5–5.0)
Alkaline Phosphatase: 99 U/L (ref 38–126)
Anion gap: 14 (ref 5–15)
BUN: 46 mg/dL — ABNORMAL HIGH (ref 8–23)
CO2: 25 mmol/L (ref 22–32)
Calcium: 9.2 mg/dL (ref 8.9–10.3)
Chloride: 99 mmol/L (ref 98–111)
Creatinine, Ser: 7.3 mg/dL — ABNORMAL HIGH (ref 0.44–1.00)
GFR, Estimated: 5 mL/min — ABNORMAL LOW (ref >60)
Glucose, Bld: 113 mg/dL — ABNORMAL HIGH (ref 70–99)
Potassium: 4.1 mmol/L (ref 3.5–5.1)
Sodium: 138 mmol/L (ref 135–145)
Total Bilirubin: 0.5 mg/dL (ref 0.3–1.2)
Total Protein: 6.5 g/dL (ref 6.5–8.1)

## 2022-08-06 LAB — CBC WITH DIFFERENTIAL/PLATELET
Abs Immature Granulocytes: 0.06 10*3/uL (ref 0.00–0.07)
Basophils Absolute: 0.1 10*3/uL (ref 0.0–0.1)
Basophils Relative: 1 %
Eosinophils Absolute: 0.2 10*3/uL (ref 0.0–0.5)
Eosinophils Relative: 2 %
HCT: 21.1 % — ABNORMAL LOW (ref 36.0–46.0)
Hemoglobin: 6.7 g/dL — CL (ref 12.0–15.0)
Immature Granulocytes: 1 %
Lymphocytes Relative: 10 %
Lymphs Abs: 0.9 10*3/uL (ref 0.7–4.0)
MCH: 37.4 pg — ABNORMAL HIGH (ref 26.0–34.0)
MCHC: 31.8 g/dL (ref 30.0–36.0)
MCV: 117.9 fL — ABNORMAL HIGH (ref 80.0–100.0)
Monocytes Absolute: 1.2 10*3/uL — ABNORMAL HIGH (ref 0.1–1.0)
Monocytes Relative: 14 %
Neutro Abs: 6.2 10*3/uL (ref 1.7–7.7)
Neutrophils Relative %: 72 %
Platelets: 309 10*3/uL (ref 150–400)
RBC: 1.79 MIL/uL — ABNORMAL LOW (ref 3.87–5.11)
RDW: 25.5 % — ABNORMAL HIGH (ref 11.5–15.5)
WBC: 8.6 10*3/uL (ref 4.0–10.5)
nRBC: 0 % (ref 0.0–0.2)

## 2022-08-06 LAB — GLUCOSE, CAPILLARY: Glucose-Capillary: 95 mg/dL (ref 70–99)

## 2022-08-06 LAB — HEPATITIS B SURFACE ANTIGEN: Hepatitis B Surface Ag: NONREACTIVE

## 2022-08-06 LAB — BRAIN NATRIURETIC PEPTIDE: B Natriuretic Peptide: 1666.2 pg/mL — ABNORMAL HIGH (ref 0.0–100.0)

## 2022-08-06 LAB — CBG MONITORING, ED: Glucose-Capillary: 160 mg/dL — ABNORMAL HIGH (ref 70–99)

## 2022-08-06 MED ORDER — SORBITOL 70 % SOLN
30.0000 mL | Status: DC | PRN
  Filled 2022-08-06: qty 30

## 2022-08-06 MED ORDER — RENA-VITE PO TABS
1.0000 | ORAL_TABLET | Freq: Every day | ORAL | Status: DC
  Administered 2022-08-07 – 2022-08-17 (×10): 1 via ORAL
  Filled 2022-08-06 (×11): qty 1

## 2022-08-06 MED ORDER — NEPRO/CARBSTEADY PO LIQD
237.0000 mL | Freq: Three times a day (TID) | ORAL | Status: DC | PRN
  Filled 2022-08-06: qty 237

## 2022-08-06 MED ORDER — ONDANSETRON HCL 4 MG PO TABS: 4.0000 mg | ORAL_TABLET | Freq: Four times a day (QID) | ORAL | Status: DC | PRN

## 2022-08-06 MED ORDER — HEPARIN SODIUM (PORCINE) 5000 UNIT/ML IJ SOLN
5000.0000 [IU] | Freq: Three times a day (TID) | INTRAMUSCULAR | Status: DC
  Administered 2022-08-06 – 2022-08-16 (×9): 5000 [IU] via SUBCUTANEOUS
  Filled 2022-08-06 (×23): qty 1

## 2022-08-06 MED ORDER — DOCUSATE SODIUM 283 MG RE ENEM
1.0000 | ENEMA | RECTAL | Status: DC | PRN
  Filled 2022-08-06: qty 1

## 2022-08-06 MED ORDER — INSULIN ASPART 100 UNIT/ML IJ SOLN
0.0000 [IU] | Freq: Three times a day (TID) | INTRAMUSCULAR | Status: DC
  Administered 2022-08-13: 1 [IU] via SUBCUTANEOUS

## 2022-08-06 MED ORDER — ALTEPLASE 2 MG IJ SOLR: 2.0000 mg | Freq: Once | INTRAMUSCULAR | Status: DC | PRN

## 2022-08-06 MED ORDER — ROSUVASTATIN CALCIUM 5 MG PO TABS
10.0000 mg | ORAL_TABLET | Freq: Every day | ORAL | Status: DC
  Administered 2022-08-06 – 2022-08-16 (×11): 10 mg via ORAL
  Filled 2022-08-06 (×11): qty 2

## 2022-08-06 MED ORDER — CHLORHEXIDINE GLUCONATE CLOTH 2 % EX PADS
6.0000 | MEDICATED_PAD | Freq: Every day | CUTANEOUS | Status: DC
  Administered 2022-08-08 – 2022-08-16 (×6): 6 via TOPICAL

## 2022-08-06 MED ORDER — FENTANYL CITRATE PF 50 MCG/ML IJ SOSY
25.0000 ug | PREFILLED_SYRINGE | INTRAMUSCULAR | Status: DC | PRN
  Administered 2022-08-06: 25 ug via INTRAVENOUS
  Filled 2022-08-06: qty 1

## 2022-08-06 MED ORDER — ONDANSETRON HCL 4 MG/2ML IJ SOLN
4.0000 mg | Freq: Four times a day (QID) | INTRAMUSCULAR | Status: DC | PRN
  Administered 2022-08-15: 4 mg via INTRAVENOUS
  Filled 2022-08-06: qty 2

## 2022-08-06 MED ORDER — SEVELAMER CARBONATE 800 MG PO TABS
2400.0000 mg | ORAL_TABLET | Freq: Three times a day (TID) | ORAL | Status: DC
  Administered 2022-08-07 – 2022-08-17 (×23): 2400 mg via ORAL
  Filled 2022-08-06 (×24): qty 3

## 2022-08-06 MED ORDER — ASPIRIN 81 MG PO TBEC
81.0000 mg | DELAYED_RELEASE_TABLET | Freq: Every day | ORAL | Status: DC
  Administered 2022-08-07 – 2022-08-17 (×11): 81 mg via ORAL
  Filled 2022-08-06 (×11): qty 1

## 2022-08-06 MED ORDER — AMIODARONE HCL 200 MG PO TABS
100.0000 mg | ORAL_TABLET | Freq: Every day | ORAL | Status: DC
  Administered 2022-08-07 – 2022-08-17 (×11): 100 mg via ORAL
  Filled 2022-08-06 (×11): qty 1

## 2022-08-06 MED ORDER — SODIUM CHLORIDE 0.9% IV SOLUTION: Freq: Once | INTRAVENOUS | Status: DC

## 2022-08-06 MED ORDER — ANTICOAGULANT SODIUM CITRATE 4% (200MG/5ML) IV SOLN: 5.0000 mL | Status: DC | PRN

## 2022-08-06 MED ORDER — LEVOTHYROXINE SODIUM 50 MCG PO TABS
50.0000 ug | ORAL_TABLET | Freq: Every day | ORAL | Status: DC
  Administered 2022-08-07 – 2022-08-09 (×3): 50 ug via ORAL
  Filled 2022-08-06 (×3): qty 1

## 2022-08-06 MED ORDER — CALCIUM CARBONATE ANTACID 1250 MG/5ML PO SUSP
500.0000 mg | Freq: Four times a day (QID) | ORAL | Status: DC | PRN
  Filled 2022-08-06: qty 5

## 2022-08-06 MED ORDER — FOLIC ACID 1 MG PO TABS
1.0000 mg | ORAL_TABLET | Freq: Every day | ORAL | Status: DC
  Administered 2022-08-07 – 2022-08-17 (×10): 1 mg via ORAL
  Filled 2022-08-06 (×10): qty 1

## 2022-08-06 MED ORDER — NALOXONE HCL 0.4 MG/ML IJ SOLN: 0.4000 mg | INTRAMUSCULAR | Status: DC | PRN

## 2022-08-06 MED ORDER — HYDROXYZINE HCL 25 MG PO TABS
25.0000 mg | ORAL_TABLET | Freq: Three times a day (TID) | ORAL | Status: DC | PRN
  Administered 2022-08-07 – 2022-08-16 (×5): 25 mg via ORAL
  Filled 2022-08-06 (×6): qty 1

## 2022-08-06 MED ORDER — HEPARIN SODIUM (PORCINE) 1000 UNIT/ML DIALYSIS
1000.0000 [IU] | INTRAMUSCULAR | Status: DC | PRN
  Administered 2022-08-12: 1000 [IU]
  Filled 2022-08-06 (×5): qty 1

## 2022-08-06 MED ORDER — CAMPHOR-MENTHOL 0.5-0.5 % EX LOTN
1.0000 | TOPICAL_LOTION | Freq: Three times a day (TID) | CUTANEOUS | Status: DC | PRN
  Filled 2022-08-06: qty 222

## 2022-08-06 MED ORDER — SODIUM CHLORIDE 0.9% FLUSH
3.0000 mL | Freq: Two times a day (BID) | INTRAVENOUS | Status: DC
  Administered 2022-08-06 – 2022-08-17 (×19): 3 mL via INTRAVENOUS

## 2022-08-06 NOTE — ED Notes (Signed)
Patient tolerating blood transfusion well at this time, no signs or symptoms of any adverse reactions. Patient and husband instructed that if something changes, to call this RN. Both verbalize understanding.

## 2022-08-06 NOTE — Progress Notes (Signed)
Pt refused labs at this time. Lab will come back in a little while and try again.   Shawna [name] Shawna [name]

## 2022-08-06 NOTE — Consult Note (Signed)
ESRD Consult Note   Assessment/Recommendations:  ESRD: -Outpatient orders: [facility], MWF. 3.5hrs. Nipro. 300 BFR. 2k/2,5cal. TDC. Mircera 146mg (last dose 08/01/22) -HD today (missed outpatient treatment yesterday), c/w MWF sched thereafter  Volume/ hypertension: EDW 59.5kg. Will UF as tolerated  Anemia of Chronic Kidney Disease, symptomatic anemia: Hemoglobin 6.7 on presentation, transfuse PRN for hgb <7. Just received ESA as an outpatient, will redose accordingly  Secondary Hyperparathyroidism/Hyperphosphatemia: resume home binders   # Additional recommendations: - Dose all meds for creatinine clearance < 10 ml/min  - Unless absolutely necessary, no MRIs with gadolinium.  - Implement save arm precautions.  Prefer needle sticks in the dorsum of the hands or wrists.  No blood pressure measurements in arm. - If blood transfusion is requested during hemodialysis sessions, please alert uKoreaprior to the session.  - If a hemodialysis catheter line culture is requested, please alert uKoreaas only hemodialysis nurses are able to collect those specimens.   VGean Quint MD [facility]  History of Present Illness: Shawna OVERTURFis a/an 82y.o. female with a past medical history of ESRD on HD, CAD s/p CABG, dCHF, recurrent GIB, chronic anemia, h/o colon+ovarian Ca who presents with confusion/fatigue. Also has been having weakness. Missed HD on Fri due to aforementioned issues. Found to have a Hgb of 6.7, received 1u prbc. Has a h/o symptomatic anemia. Nephro consult for provision of HD. She was quite sleepy upon my arrival to the ER but arousable. She does report that she has been having leg swelling and is not sure why. Seems to be an ongoing issue. She denies any chest pain, SOB, issues with catheter.   Medications:  Current Facility-Administered Medications  Medication Dose Route Frequency Provider Last Rate Last Admin   0.9 %  sodium chloride infusion (Manually program via  Guardrails IV Fluids)   Intravenous Once BMontine Circle PA-C   Paused at 08/06/22 04332  Current Outpatient Medications  Medication Sig Dispense Refill   ALPRAZolam (XANAX) 0.5 MG tablet Take 0.5 mg by mouth daily as needed.     amiodarone (PACERONE) 100 MG tablet Take 1 tablet (100 mg total) by mouth daily. 30 tablet 0   Aspirin 81 MG CAPS Take 1 capsule by mouth daily at 12 noon.     benzonatate (TESSALON) 100 MG capsule Take 100 mg by mouth 3 (three) times daily as needed for cough.     cyclobenzaprine (FLEXERIL) 5 MG tablet Take 5 mg by mouth 3 (three) times daily as needed for muscle spasms.     folic acid (FOLVITE) 1 MG tablet Take 1 mg by mouth daily.     hydrOXYzine (ATARAX) 25 MG tablet Take 25 mg by mouth every 6 (six) hours as needed for itching.     levothyroxine (SYNTHROID) 50 MCG tablet Take 1 tablet (50 mcg total) by mouth daily before breakfast.     metoprolol tartrate (LOPRESSOR) 25 MG tablet Take 0.5 tablets (12.5 mg total) by mouth 2 (two) times daily. 30 tablet 6   multivitamin (RENA-VIT) TABS tablet Take 1 tablet by mouth daily.     nitroGLYCERIN (NITROSTAT) 0.4 MG SL tablet Place 0.4 mg under the tongue every 5 (five) minutes as needed for chest pain.     omeprazole (PRILOSEC) 20 MG capsule Take 2 capsules (40 mg total) by mouth daily as needed (acid reflux).     ondansetron (ZOFRAN-ODT) 4 MG disintegrating tablet Take 4 mg by mouth every 8 (eight) hours as needed for nausea  or vomiting.     rosuvastatin (CRESTOR) 10 MG tablet Take 1 tablet by mouth once daily 90 tablet 0   sevelamer carbonate (RENVELA) 800 MG tablet Take 2,400 mg by mouth 3 (three) times daily.       ALLERGIES Amlodipine, Aspirin, Nitrofurantoin, Bactrim [sulfamethoxazole-trimethoprim], Contrast media [iodinated contrast media], Iron, Tylenol [acetaminophen], Gabapentin, Iron sucrose, Ranexa [ranolazine], Sucroferric oxyhydroxide, Dexilant [dexlansoprazole], Hydralazine, Levaquin [levofloxacin in d5w],  Morphine and related, Pantoprazole, Plavix [clopidogrel bisulfate], Protonix [pantoprazole sodium], and Venofer [ferric oxide]  MEDICAL HISTORY Past Medical History:  Diagnosis Date   Acute on chronic respiratory failure with hypoxia ([place]) 10/10/2016   Anxiety    Arthritis    AVM (arteriovenous malformation) of colon    CAD (coronary artery disease)    a. s/p CABG in 2013 b. DES to D1 in 10/2016. c. cath in 07/2018 showing patent grafts with occlusion of D1 at prior stent site and progression of PDA disease --> medical management recommended   Carotid artery disease ([place])    a. 19-16% LICA, 12/598    Chronic anemia    Chronic bronchitis ([facility])    Chronic HFrEF (heart failure with reduced ejection fraction) ([place])    Colon cancer ([place]) 1992   Esophageal stricture    ESRD on hemodialysis ([place])    ESRD due to HTN, started dialysis 2011 and gets HD at [facility] with Dr Hinda Lenis on MWF schedule.  Access is LUA AVF as of Sept 2014.    GERD (gastroesophageal reflux disease)    High cholesterol 12/2011   History of blood transfusion 07/2011; 12/2011; 01/2012 X 2; 04/2012   History of gout    History of lower GI bleeding    Hypertension    Iron deficiency anemia    Jugular vein occlusion, right (HCC)    Mitral regurgitation    a. Moderate by echo, 02/2012   Mitral valve disease    Myocardial infarction [facility])    NSVT (nonsustained ventricular tachycardia) (H[facility])    Ovarian cancer ([place]) 1992   PAF (paroxysmal atrial fibrillation) ([place])    Pneumonia ~ 2009   PUD (peptic ulcer disease)    TIA (transient ischemic attack)    Tricuspid valve disease      SOCIAL HISTORY Social History   Socioeconomic History   Marital status: Married    Spouse name: Earlyne Feeser   Number of children: 1   Years of education: Not on file   Highest education level: GED or equivalent  Occupational History   Occupation: retired- Technical sales engineer- [employer]  Tobacco Use   Smoking status: Never    Smokeless tobacco: Never  Vaping Use   Vaping Use: Never used  Substance and Sexual Activity   Alcohol use: No    Alcohol/week: 0.0 standard drinks of alcohol   Drug use: No   Sexual activity: Not Currently    Birth control/protection: Surgical  Other Topics Concern   Not on file  Social History Narrative   Lives in [place], [place] with husband.  Dialysis pt - mwf.   Social Determinants of Health   Financial Resource Strain: Low Risk  (12/14/2018)   Overall Financial Resource Strain (CARDIA)    Difficulty of Paying Living Expenses: Not very hard  Food Insecurity: No Food Insecurity (04/21/2022)   Hunger Vital Sign    Worried About Running Out of Food in the Last Year: Never true    Ran Out of Food in the Last Year: Never true  Transportation Needs: No Transportation  Needs (04/21/2022)   PRAPARE - Hydrologist (Medical): No    Lack of Transportation (Non-Medical): No  Physical Activity: Unknown (12/14/2018)   Exercise Vital Sign    Days of Exercise per Week: Patient refused    Minutes of Exercise per Session: Patient refused  Stress: No Stress Concern Present (12/14/2018)   [place]    Feeling of Stress : Only a little  Social Connections: Unknown (12/14/2018)   Social Connection and Isolation Panel [NHANES]    Frequency of Communication with Friends and Family: Patient refused    Frequency of Social Gatherings with Friends and Family: Patient refused    Attends Religious Services: Patient refused    Active Member of Clubs or Organizations: Patient refused    Attends Archivist Meetings: Patient refused    Marital Status: Patient refused  Intimate Partner Violence: Not At Risk (04/21/2022)   Humiliation, Afraid, Rape, and Kick questionnaire    Fear of Current or Ex-Partner: No    Emotionally Abused: No    Physically Abused: No    Sexually Abused: No     FAMILY HISTORY Family  History  Problem Relation Age of Onset   Heart disease Mother        Heart Disease before age 28   Hyperlipidemia Mother    Hypertension Mother    Diabetes Mother    Heart attack Mother    Heart disease Father        Heart Disease before age 45   Hyperlipidemia Father    Hypertension Father    Diabetes Father    Diabetes Sister    Hypertension Sister    Diabetes Brother    Hyperlipidemia Brother    Heart attack Brother    Hypertension Sister    Heart attack Brother    Colon cancer Child 40   Other Other        noncontributory for early CAD   Esophageal cancer Neg Hx    Liver disease Neg Hx    Kidney disease Neg Hx    Colon polyps Neg Hx      Review of Systems: 12 systems were reviewed and negative except per HPI  Physical Exam: Vitals:   08/06/22 0624 08/06/22 0640  BP: (!) 160/56 (!) 155/72  Pulse: (!) 57 (!) 56  Resp: 20 20  Temp: 98 F (36.7 C) 98.2 F (36.8 C)  SpO2: 98% 100%   No intake/output data recorded. No intake or output data in the 24 hours ending 08/06/22 0723 General: chronically ill appearing, sleepy CV: normal rate, no murmurs, no edema Lungs: bilateral chest rise, normal wob Abd: soft, non-tender, non-distended Ext: 1+ pitting edema b/l LEs Neuro: sleepy, responds to tactile/vocal stimuli Dialysis access: RUE AVF pulsatile, Left chest [facility] c/d/i  Test Results Reviewed Lab Results  Component Value Date   NA 138 08/06/2022   K 4.1 08/06/2022   CL 99 08/06/2022   CO2 25 08/06/2022   BUN 46 (H) 08/06/2022   CREATININE 7.30 (H) 08/06/2022   CALCIUM 9.2 08/06/2022   ALBUMIN 3.0 (L) 08/06/2022   PHOS 4.4 06/11/2022    I have reviewed relevant outside healthcare records

## 2022-08-06 NOTE — ED Provider Notes (Signed)
[facility] Emergency Department Provider Note MRN:  [id number]  Arrival date & time: 08/06/22     Chief Complaint   Missed Hemodialysis /Confused   History of Present Illness   Shawna [name] is a 82 y.o. year-old female presents to the ED with chief complaint of confusion and fatigue.  Patient has been having lack of energy for the past day or 2.  States that she was not able to stand and walk today due to fatigue and lack of energy.  She also did not make it to her dialysis appointment.  She is dialyzed on Monday/Wednesday/Friday.  She denies cough or fever.  Denies any blood in her stools.  She is not anticoagulated.  She has required blood transfusions in the past for symptomatic anemia.  History provided by patient.   Review of Systems  Pertinent positive and negative review of systems noted in HPI.    Physical Exam   Vitals:   08/06/22 0354 08/06/22 0430  BP: (!) 166/51 (!) 154/48  Pulse: (!) 59 (!) 56  Resp: 16 (!) 25  Temp:    SpO2: 97% 98%    CONSTITUTIONAL: Nontoxic-appearing, NAD NEURO:  Alert and oriented x 3, CN 3-12 grossly intact EYES:  eyes equal and reactive ENT/NECK:  Supple, no stridor  CARDIO: Slightly bradycardic, regular rhythm, appears well-perfused, dialysis catheter in left chest wall PULM:  No respiratory distress, CTAB GI/GU:  non-distended,  MSK/SPINE:  No gross deformities, no edema, moves all extremities  SKIN:  no rash, atraumatic   *Additional and/or pertinent findings included in MDM below  Diagnostic and Interventional Summary    EKG Interpretation  Date/Time:    Ventricular Rate:    PR Interval:    QRS Duration:   QT Interval:    QTC Calculation:   R Axis:     Text Interpretation:         Labs Reviewed  CBC WITH DIFFERENTIAL/PLATELET - Abnormal; Notable for the following components:      Result Value   RBC 1.79 (*)    Hemoglobin 6.7 (*)    HCT 21.1 (*)    MCV 117.9 (*)    MCH 37.4 (*)    RDW  25.5 (*)    Monocytes Absolute 1.2 (*)    All other components within normal limits  COMPREHENSIVE METABOLIC PANEL - Abnormal; Notable for the following components:   Glucose, Bld 113 (*)    BUN 46 (*)    Creatinine, Ser 7.30 (*)    Albumin 3.0 (*)    AST 11 (*)    GFR, Estimated 5 (*)    All other components within normal limits  BRAIN NATRIURETIC PEPTIDE - Abnormal; Notable for the following components:   B Natriuretic Peptide 0,175.1 (*)    All other components within normal limits  CBG MONITORING, ED - Abnormal; Notable for the following components:   Glucose-Capillary 160 (*)    All other components within normal limits  TYPE AND SCREEN  PREPARE RBC (CROSSMATCH)    CT HEAD WO CONTRAST (5MM)  Final Result    DG Chest 2 View  Final Result      Medications  0.9 %  sodium chloride infusion (Manually program via Guardrails IV Fluids) (has no administration in time range)     Procedures  /  Critical Care .Critical Care  Performed by: Montine Circle, PA-C Authorized by: Montine Circle, PA-C   Critical care provider statement:    Critical care time (minutes):  62   Critical care was necessary to treat or prevent imminent or life-threatening deterioration of the following conditions:  Circulatory failure   Critical care was time spent personally by me on the following activities:  Development of treatment plan with patient or surrogate, discussions with consultants, evaluation of patient's response to treatment, examination of patient, ordering and review of laboratory studies, ordering and review of radiographic studies, ordering and performing treatments and interventions, pulse oximetry, re-evaluation of patient's condition and review of old charts   ED Course and Medical Decision Making  I have reviewed the triage vital signs, the nursing notes, and pertinent available records from the EMR.  Social Determinants Affecting Complexity of Care: Patient has no clinically  significant social determinants affecting this chief complaint..   ED Course:    Medical Decision Making Patient here with confusion, fatigue, generalized weakness.  Patient was unable to stand and walk earlier today.  This is thought to be more due to generalized fatigue and weakness.  She did have a CT head that was done in triage which did not show any evidence of acute stroke.  She is able to move both of her legs on my exam.  She has noted to be anemic to 6.7.  She has required blood transfusions in the past due to symptomatic anemia.  She denies any black or bloody stools.  I will transfuse 1 unit.  She did miss dialysis, BUN is 46, I do not know that she necessarily has to have emergent dialysis, but I will contact nephrology for consultation.  She does have some pleural effusions that are small and elevated BNP of 1600.  Will consult hospitalist for admission.  Problems Addressed: Generalized weakness: acute illness or injury Symptomatic anemia: acute illness or injury that poses a threat to life or bodily functions  Amount and/or Complexity of Data Reviewed Labs: ordered.    Details: CBC notable for hemoglobin of 6.7, which is quite a bit lower than patient's baseline.  No reported blood loss or dark stools.  Thought to be secondary to chronic disease.  Potassium is 4.1 BUN 48 Radiology: independent interpretation performed.    Details: Cardiomegaly  Risk Prescription drug management. Decision regarding hospitalization.     Consultants: I discussed the case with Hospitalist, Dr. Nevada Crane, who is appreciated for admitting. I consulted with Dr. Candiss Norse, From nephrology, who will consult.   Treatment and Plan: Patient's exam and diagnostic results are concerning for symptomatic anemia and confusion with need for dialysis.  Feel that patient will need admission to the hospital for further treatment and evaluation.    Final Clinical Impressions(s) / ED Diagnoses     ICD-10-CM    1. Generalized weakness  R53.1     2. Symptomatic anemia  D64.9       ED Discharge Orders     None         Discharge Instructions Discussed with and Provided to Patient:   Discharge Instructions   None      Montine Circle, PA-C 08/06/22 9476    Mesner, Corene Cornea, MD 08/06/22 [phone]

## 2022-08-06 NOTE — Progress Notes (Signed)
[facility] night cross cover note:   I was notified by RN of pt's report of headache and associated request for prn pain medication.  Per my brief chart review, it appears that she is ESRD and that her presentation is a/w acute on chronic anemia for which she received PRBC transfusion.  In this context, will refrain from prn NSAIDs for now.  I have placed order for fentanyl 25 mcg IV every 2 hours as needed x 2 doses, and communicated this plan to the patient's RN.     Babs Bertin, DO Hospitalist

## 2022-08-06 NOTE — ED Notes (Signed)
Pt has limited access in right arm with left arm restriction, only has a 22G ultrasound guided IV that is currently infusing blood products. ED phlebotomist attempted to pull Hepatitis labs but was unsuccessful. This nurse called the main lab to see if a gold top had been sent down before my shift, but no luck. Dialysis nurse notified of situation.

## 2022-08-06 NOTE — ED Notes (Signed)
Pt OTF to dialysis at this time.

## 2022-08-06 NOTE — Progress Notes (Signed)
Pt c/o headache at 10/10. RN messaged MD on call for a prn order for pain. RN awaiting response.  Foster Simpson Sasha Rogel

## 2022-08-06 NOTE — ED Provider Triage Note (Signed)
Emergency Medicine Provider Triage Evaluation Note  Shawna [name] , a 82 y.o. female  was evaluated in triage.  Patient presenting with daughter who expresses concern for confusion. States that patient missed dialysis yesterday (Friday). Had episodes tonight where she appeared winded and her speech was not as clear. Also was unable to walk prior to arrival which is not her baseline. No associated fevers, nausea, vomiting, extremity numbness. Patient with hx of ESRD on dialysis; does not make urine.  Review of Systems  Positive: As above Negative: As above  Physical Exam  BP (!) 150/46   Pulse (!) 57   Temp 98.7 F (37.1 C) (Oral)   Resp 18   SpO2 100%  Gen:   Awake, no distress   Resp:  Normal effort  MSK:   Moves extremities without difficulty  Other:  A&Ox4 with GCS 15. No deficits appreciated to CN III-XII; symmetric eyebrow raise, no facial drooping, tongue midline. Patient has equal grip strength bilaterally with 5/5 strength against resistance in all major muscle groups bilaterally. Sensation to light touch intact. Patient moves extremities without ataxia.   Medical Decision Making  Medically screening exam initiated at 1:18 AM.  Appropriate orders placed.  [name] was informed that the remainder of the evaluation will be completed by another provider, this initial triage assessment does not replace that evaluation, and the importance of remaining in the ED until their evaluation is complete.  Altered mental status - unclear cause. ESRD patient. No focal neurologic deficits. Labs and imaging initiated.   Antonietta Breach, PA-C 08/06/22 0121

## 2022-08-06 NOTE — Consult Note (Signed)
Consultation Note Date: 08/06/2022   Patient Name: Shawna [name]  DOB: 07-03-1941  MRN: [id number]  Age / Sex: 82 y.o., female  PCP: [facility] Referring Physician: Karmen Bongo, MD  Reason for Consultation: Establishing goals of care  HPI/Patient Profile: 82 y.o. female  with past medical history of ESRD on MWF HD, CAD s/p CABG, chronic systolic CHF, HLD, HTN, and afib  admitted on 08/06/2022 with generalized weakness.   Patient has had 3 admissions and 4 ED visits in the past 6 months.  She is now admitted with failure to thrive after missing HD. She was last admitted from 12/1-3 for anemia associated with ABLA from her bleeding fistula, not on AC despite afib. She returned on 12/8 with a cough, treated with Amoxil and Doxy with indeterminate CXR. She returned on 1/19 with R-sided headache, diagnosed with occipital neuralgia and treated with nerve block. She returned again on 1/25 with neck pain attributed to the same. She was not started on treatment.   PMT has been consulted to assist with goals of care conversation.  Clinical Assessment and Goals of Care:  I have reviewed medical records including EPIC notes, labs and imaging, received report from RN, assessed the patient and then met at the bedside with patient's daughter Luster Landsberg to discuss diagnosis prognosis, GOC, EOL wishes, disposition and options.  I introduced Palliative Medicine as specialized medical care for people living with serious illness. It focuses on providing relief from the symptoms and stress of a serious illness. The goal is to improve quality of life for both the patient and the family.  We discussed a brief life review of the patient and then focused on their current illness.   I attempted to elicit values and goals of care important to the patient.    Medical History Review and Understanding:  Patient's daughter  reports a good understanding of the current care plan and patient's overall illness.  Social History: Patient is married and lives at home with her husband.  They have 1 daughter, who lives in [place] but had just arrived last night for visitation and celebration of their anniversary.  Luster Landsberg tells me that patient has "not sounded right" on the phone for about a month, since Christmas time.  They are a family of faith.  Functional and Nutritional State: Patient has had cognitive decline for about a month, frequently saying people are upstairs or downstairs when they are not and sounding like she thinks she is still in the hospital.  She often thinks she is in someone else's home.  For the past day, patient has now been unable to stand or walk.  Daughter reports she was ambulating well before then.  Palliative Symptoms: Confusion debility  Advance Directives: A detailed discussion regarding advanced directives was had.  Reviewed goals of care document in [facility] from February 2023.  Goals of care are clear at that time for full code/full scope treatment, as patient had already survived multiple cardiac arrests and successfully weaned off the ventilator multiple times.  Code Status: Concepts specific to code status, artifical feeding and hydration, and rehospitalization were considered and discussed.  Patient's daughter feels that her wishes remain the same as previously stated.  Discussion: Patient's daughter tells me that Lanisha has just fallen asleep after very long evening/day.  I did not attempt to arouse her in order to preserve her comfort, acknowledging that she would likely be able to participate in the discussion better after receiving dialysis.  Luster Landsberg was  very worried when she came down to visit last night, as her mother could barely stay awake on the couch.  She was awake through dinner, but then became very lethargic again.  They called EMS when she was unable to get her up with her  father's help.  Luster Landsberg was wondering if it was due to anemia as was the case recently.  We discussed that this could also be related to her missing dialysis.  We discussed that frequent hospitalizations and delirium can often accelerate cognitive decline in elderly patients, even if they did not have underlying dementia.  I attempted to explore Marika's thoughts on whether patient would find it acceptable to no longer be able to ambulate, how patient's quality of life will be impacted if this does not improve.  She quickly states that it has only been a day, that she is very hopeful for patient to improve.  She is appreciative of the support and is agreeable to ongoing goals of care discussions pending clinical course.   Discussed the importance of continued conversation with family and the medical providers regarding overall plan of care and treatment options, ensuring decisions are within the context of the patient's values and GOCs.   Questions and concerns were addressed.  Hard Choices booklet left for review. The family was encouraged to call with questions or concerns.  PMT will continue to support holistically.   SUMMARY OF RECOMMENDATIONS   -Continue full code/full scope treatment -Patient's daughter does not feel that patient's wishes have changed since last goals of care conversation, she has always been clear on her desire for full code and all available life-prolonging interventions  -Psychosocial and emotional support provided -Ongoing goals of care discussions pending clinical course -PMT will see again on 1/29  Prognosis:  Poor long-term prognosis given progressive decline, multiple hospitalizations, and multiple chronic comorbidities  Discharge Planning: To Be Determined      Primary Diagnoses: Present on Admission: **None**   Physical Exam Vitals and nursing note reviewed.  Constitutional:      General: She is sleeping. She is not in acute distress.    Appearance: She  is ill-appearing.  Cardiovascular:     Rate and Rhythm: Bradycardia present.  Pulmonary:     Effort: Pulmonary effort is normal. No respiratory distress.    Vital Signs: BP (!) 173/65   Pulse (!) 57   Temp 97.8 F (36.6 C) (Oral)   Resp (!) 21   SpO2 99%  Pain Scale: 0-10   Pain Score: 0-No pain   SpO2: SpO2: 99 % O2 Device:SpO2: 99 % O2 Flow Rate: .    Palliative Assessment/Data: 30%     MDM: high    Temperence Zenor Johnnette Litter, PA-C  Palliative Medicine Team Team phone # [phone]  Thank you for allowing the Palliative Medicine Team to assist in the care of this patient. Please utilize secure chat with additional questions, if there is no response within 30 minutes please call the above phone number.  Palliative Medicine Team providers are available by phone from 7am to 7pm daily and can be reached through the team cell phone.  Should this patient require assistance outside of these hours, please call the patient's attending physician.

## 2022-08-06 NOTE — H&P (Signed)
History and Physical    Patient: Shawna [name] AST:[id number] DOB: [birth year] DOA: 08/06/2022 DOS: the patient was seen and examined on 08/06/2022 PCP: [facility]  Patient coming from: Home - lives with husband; NOK: Husband, [phone]   Chief Complaint: weakness  HPI: Shawna [name] is a 82 y.o. female with medical history significant of ESRD on MWF HD, CAD s/p CABG, chronic systolic CHF, HLD, HTN, and afib presenting with generalized weakness.  Her husband reports severe headaches around the ear and down to her neck, difficulty turning her neck, difficulty walking.  Symptoms started about a week ago.  She had a couple of shots in her neck on 1/19 and she was given a pill for it on 1/25. It eased some but still present.  She missed last Friday and this Friday, needs it today.  Not obviously SOB.  She was last admitted from 12/1-3 for anemia associated with ABLA from her bleeding fistula, not on AC despite afib.  She returned on 12/8 with a cough, treated with Amoxil and Doxy with indeterminate CXR.  She returned on 1/19 with R-sided headache, diagnosed with occipital neuralgia and treated with nerve block.   She returned again on 1/25 with neck pain attributed to the same.  She was not started on treatment.    ER Course:  Carryover, per Dr. Nevada Crane:  The patient is a 82 year old female with past medical history significant for ESRD on hemodialysis MWF, anuric, who presented to the ED due to an episode of confusion today, associated with generalized weakness.  Missed hemodialysis on Friday.  Reportedly she has been unable to stand or walk.  CT head nonacute.  Anemic with hemoglobin of 6.7.  Volume overload on exam, BNP greater than 1000.  EDP will consult nephrology for possible hemodialysis today.  Plan for blood transfusion.      Review of Systems: unable to review all systems due to the inability of the patient to answer questions. Past Medical History:  Diagnosis Date    Acute on chronic respiratory failure with hypoxia ([place]) 10/10/2016   Anxiety    Arthritis    AVM (arteriovenous malformation) of colon    CAD (coronary artery disease)    a. s/p CABG in 2013 b. DES to D1 in 10/2016. c. cath in 07/2018 showing patent grafts with occlusion of D1 at prior stent site and progression of PDA disease --> medical management recommended   Carotid artery disease ([place])    a. 40-81% LICA, 10/4816    Chronic bronchitis (HCC)    Chronic HFrEF (heart failure with reduced ejection fraction) ([place])    Colon cancer ([place]) 1992   Esophageal stricture    ESRD on hemodialysis ([place])    ESRD due to HTN, started dialysis 2011 and gets HD at [facility] with Dr Hinda Lenis on MWF schedule.  Access is LUA AVF as of Sept 2014.    GERD (gastroesophageal reflux disease)    High cholesterol 12/2011   History of blood transfusion 07/2011; 12/2011; 01/2012 X 2; 04/2012   History of gout    History of lower GI bleeding    Hypertension    Iron deficiency anemia    Jugular vein occlusion, right (HCC)    Mitral regurgitation    a. Moderate by echo, 02/2012   Mitral valve disease    NSVT (nonsustained ventricular tachycardia) ([place])    Ovarian cancer ([place]) 1992   PAF (paroxysmal atrial fibrillation) ([place])    Pneumonia ~ 2009  PUD (peptic ulcer disease)    TIA (transient ischemic attack)    Tricuspid valve disease    Past Surgical History:  Procedure Laterality Date   A/V SHUNTOGRAM Left 03/19/2019   Procedure: A/V SHUNTOGRAM;  Surgeon: Katha Cabal, MD;  Location: [place] [facility];  Service: Cardiovascular;  Laterality: Left;   ABDOMINAL HYSTERECTOMY  1992   APPENDECTOMY  06/1990   AV FISTULA PLACEMENT  07/2009   left upper arm   AV FISTULA PLACEMENT Right 09/06/2016   Procedure: RIGHT FOREARM ARTERIOVENOUS (AV) GRAFT;  Surgeon: Elam Dutch, MD;  Location: [place];  Service: Vascular;  Laterality: Right;   AV FISTULA PLACEMENT N/A 02/24/2017   Procedure: INSERTION OF  ARTERIOVENOUS (AV) GORE-TEX GRAFT ARM (BRACHIAL AXILLARY);  Surgeon: Katha Cabal, MD;  Location: [facility];  Service: Vascular;  Laterality: N/A;   [place] Right 09/06/2016   Procedure: REMOVAL OF Right Arm ARTERIOVENOUS GORETEX GRAFT and Vein Patch angioplasty of brachial artery;  Surgeon: Angelia Mould, MD;  Location: [place];  Service: Vascular;  Laterality: Right;   BIOPSY  09/26/2019   Procedure: BIOPSY;  Surgeon: Rogene Houston, MD;  [facility];  Service: Endoscopy;;   COLON RESECTION  1992   COLON SURGERY     COLONOSCOPY N/A 03/09/2019   Procedure: COLONOSCOPY;  Surgeon: Rogene Houston, MD;  [facility];  Service: Endoscopy;  Laterality: N/A;   COLONOSCOPY WITH PROPOFOL N/A 06/08/2021   Procedure: COLONOSCOPY WITH PROPOFOL;  Surgeon: Harvel Quale, MD;  [facility];  Service: Gastroenterology;  Laterality: N/A;  9:05 /Patient is on dialysis Mon Wed Fri   CORONARY ANGIOPLASTY WITH STENT PLACEMENT  12/15/11   "2"   CORONARY ANGIOPLASTY WITH STENT PLACEMENT  y/2013   "1; makes total of 3" (05/02/2012)   CORONARY ARTERY BYPASS GRAFT  06/13/2012   Procedure: CORONARY ARTERY BYPASS GRAFTING (CABG);  Surgeon: Grace Isaac, MD;  Location: [place];  Service: Open Heart Surgery;  Laterality: N/A;  cabg x four;  using left internal mammary artery, and left leg greater saphenous vein harvested endoscopically   CORONARY STENT INTERVENTION N/A 10/13/2016   Procedure: Coronary Stent Intervention;  Surgeon: Troy Sine, MD;  Location: [place] [facility];  Service: Cardiovascular;  Laterality: N/A;   DIALYSIS/PERMA CATHETER REMOVAL N/A 04/18/2017   Procedure: DIALYSIS/PERMA CATHETER REMOVAL;  Surgeon: Katha Cabal, MD;  Location: [place] [facility];  Service: Cardiovascular;  Laterality: N/A;   DILATION AND CURETTAGE OF UTERUS     ENTEROSCOPY N/A 06/08/2021   Procedure: PUSH ENTEROSCOPY;  Surgeon: Harvel Quale, MD;   [facility];  Service: Gastroenterology;  Laterality: N/A;   ESOPHAGOGASTRODUODENOSCOPY  01/20/2012   Procedure: ESOPHAGOGASTRODUODENOSCOPY (EGD);  Surgeon: Ladene Artist, MD,FACG;  Location: [facility] ENDOSCOPY;  Service: Endoscopy;  Laterality: N/A;   ESOPHAGOGASTRODUODENOSCOPY N/A 03/26/2013   Procedure: ESOPHAGOGASTRODUODENOSCOPY (EGD);  Surgeon: Irene Shipper, MD;  Location: [facility] ENDOSCOPY;  Service: Endoscopy;  Laterality: N/A;   ESOPHAGOGASTRODUODENOSCOPY N/A 04/30/2015   Procedure: ESOPHAGOGASTRODUODENOSCOPY (EGD);  Surgeon: Rogene Houston, MD;  [facility];  Service: Endoscopy;  Laterality: N/A;  1pm - moved to 10/20 @ 1:10   ESOPHAGOGASTRODUODENOSCOPY N/A 07/29/2016   Procedure: ESOPHAGOGASTRODUODENOSCOPY (EGD);  Surgeon: Manus Gunning, MD;  Location: [place];  Service: Gastroenterology;  Laterality: N/A;  enteroscopy   ESOPHAGOGASTRODUODENOSCOPY N/A 09/26/2019   Procedure: ESOPHAGOGASTRODUODENOSCOPY (EGD);  Surgeon: Rogene Houston, MD;  [facility];  Service: Endoscopy;  Laterality: N/A;  1250   ESOPHAGOGASTRODUODENOSCOPY (EGD) WITH PROPOFOL N/A 02/05/2021   Procedure: ESOPHAGOGASTRODUODENOSCOPY (EGD) WITH PROPOFOL;  Surgeon: Eloise Harman, DO;  [facility];  Service: Endoscopy;  Laterality: N/A;   GIVENS CAPSULE STUDY N/A 03/07/2019   Procedure: GIVENS CAPSULE STUDY;  Surgeon: Rogene Houston, MD;  [facility];  Service: Endoscopy;  Laterality: N/A;  7:30   GIVENS CAPSULE STUDY N/A 04/22/2021   Procedure: GIVENS CAPSULE STUDY;  Surgeon: Rogene Houston, MD;  [facility];  Service: Endoscopy;  Laterality: N/A;  7:30   INSERTION OF DIALYSIS CATHETER N/A 10/05/2020   Procedure: ABORTED TUNNELED DIALYSIS CATHETER PLACEMENT RIGHT INTERNAL JUGULAR VEIN ;  Surgeon: Virl Cagey, MD;  [facility];  Service: General;  Laterality: N/A;   INTRAOPERATIVE TRANSESOPHAGEAL ECHOCARDIOGRAM  06/13/2012    Procedure: INTRAOPERATIVE TRANSESOPHAGEAL ECHOCARDIOGRAM;  Surgeon: Grace Isaac, MD;  Location: [place];  Service: Open Heart Surgery;  Laterality: N/A;   IR DIALY SHUNT INTRO NEEDLE/INTRACATH INITIAL W/IMG LEFT Left 10/06/2020   IR FLUORO GUIDE CV LINE RIGHT  06/17/2020   IR GENERIC HISTORICAL  07/26/2016   IR FLUORO GUIDE CV LINE RIGHT 07/26/2016 Greggory Keen, MD [facility]   IR GENERIC HISTORICAL  07/26/2016   IR US GUIDE VASC ACCESS RIGHT 07/26/2016 Greggory Keen, MD [facility]   IR GENERIC HISTORICAL  08/02/2016   IR US GUIDE VASC ACCESS RIGHT 08/02/2016 Greggory Keen, MD [facility]   IR GENERIC HISTORICAL  08/02/2016   IR FLUORO GUIDE CV LINE RIGHT 08/02/2016 Greggory Keen, MD [facility]   IR RADIOLOGY PERIPHERAL GUIDED IV START  03/28/2017   IR REMOVAL TUN CV CATH W/O FL  08/11/2020   IR THROMBECTOMY AV FISTULA W/THROMBOLYSIS INC/SHUNT/IMG LEFT Left 06/17/2020   IR US GUIDE VASC ACCESS LEFT  06/17/2020   IR US GUIDE VASC ACCESS RIGHT  03/28/2017   IR US GUIDE VASC ACCESS RIGHT  06/17/2020   LEFT HEART CATH AND CORONARY ANGIOGRAPHY N/A 09/20/2016   Procedure: Left Heart Cath and Coronary Angiography;  Surgeon: [name], MD;  Location: [place] [facility];  Service: Cardiovascular;  Laterality: N/A;   LEFT HEART CATH AND CORS/GRAFTS ANGIOGRAPHY N/A 10/13/2016   Procedure: Left Heart Cath and Cors/Grafts Angiography;  Surgeon: Troy Sine, MD;  Location: [place] [facility];  Service: Cardiovascular;  Laterality: N/A;   LEFT HEART CATH AND CORS/GRAFTS ANGIOGRAPHY N/A 07/13/2018   Procedure: LEFT HEART CATH AND CORS/GRAFTS ANGIOGRAPHY;  Surgeon: Martinique, Peter M, MD;  Location: [place] [facility];  Service: Cardiovascular;  Laterality: N/A;   LEFT HEART CATH AND CORS/GRAFTS ANGIOGRAPHY N/A 07/22/2021   Procedure: LEFT HEART CATH AND CORS/GRAFTS ANGIOGRAPHY;  Surgeon: Lorretta Harp, MD;  Location: [place] [facility];  Service: Cardiovascular;  Laterality: N/A;   LEFT HEART  CATHETERIZATION WITH CORONARY ANGIOGRAM N/A 12/15/2011   Procedure: LEFT HEART CATHETERIZATION WITH CORONARY ANGIOGRAM;  Surgeon: Burnell Blanks, MD;  Location: [facility] CATH LAB;  Service: Cardiovascular;  Laterality: N/A;   LEFT HEART CATHETERIZATION WITH CORONARY ANGIOGRAM N/A 01/10/2012   Procedure: LEFT HEART CATHETERIZATION WITH CORONARY ANGIOGRAM;  Surgeon: Peter M Martinique, MD;  Location: [facility] CATH LAB;  Service: Cardiovascular;  Laterality: N/A;   LEFT HEART CATHETERIZATION WITH CORONARY ANGIOGRAM N/A 06/08/2012   Procedure: LEFT HEART CATHETERIZATION WITH CORONARY ANGIOGRAM;  Surgeon: Burnell Blanks, MD;  Location: [facility] CATH LAB;  Service: Cardiovascular;  Laterality: N/A;  LEFT HEART CATHETERIZATION WITH CORONARY/GRAFT ANGIOGRAM N/A 12/10/2013   Procedure: LEFT HEART CATHETERIZATION WITH Beatrix Fetters;  Surgeon: [name], MD;  Location: [facility] CATH LAB;  Service: Cardiovascular;  Laterality: N/A;   OVARY SURGERY     ovarian cancer   POLYPECTOMY  03/09/2019   Procedure: POLYPECTOMY;  Surgeon: Rogene Houston, MD;  [facility];  Service: Endoscopy;;  cecal    POLYPECTOMY N/A 09/26/2019   Procedure: DUODENAL POLYPECTOMY;  Surgeon: Rogene Houston, MD;  [facility];  Service: Endoscopy;  Laterality: N/A;   REVISION OF ARTERIOVENOUS GORETEX GRAFT N/A 02/24/2017   Procedure: REVISION OF ARTERIOVENOUS GORETEX GRAFT (RESECTION);  Surgeon: Katha Cabal, MD;  Location: [facility];  Service: Vascular;  Laterality: N/A;   REVISON OF ARTERIOVENOUS FISTULA Left 06/19/2020   Procedure: REVISION OF LEFT UPPER ARM AV GRAFT WITH INTERPOSITION JUMP GRAFT USING 6MM GORE LIMB;  Surgeon: Marty Heck, MD;  Location: [place];  Service: Vascular;  Laterality: Left;   SHUNTOGRAM N/A 10/15/2013   Procedure: Fistulogram;  Surgeon: Serafina Mitchell, MD;  Location: [facility] CATH LAB;  Service: Cardiovascular;  Laterality: N/A;   THROMBECTOMY / ARTERIOVENOUS GRAFT REVISION   2011   left upper arm   TUBAL LIGATION  1980's   UPPER EXTREMITY ANGIOGRAPHY Bilateral 12/06/2016   Procedure: Upper Extremity Angiography;  Surgeon: Katha Cabal, MD;  Location: [place] [facility];  Service: Cardiovascular;  Laterality: Bilateral;   UPPER EXTREMITY INTERVENTION Left 06/06/2017   Procedure: UPPER EXTREMITY INTERVENTION;  Surgeon: Katha Cabal, MD;  Location: [place] [facility];  Service: Cardiovascular;  Laterality: Left;   Social History:  reports that she has never smoked. She has never used smokeless tobacco. She reports that she does not drink alcohol and does not use drugs.  Allergies  Allergen Reactions   Amlodipine Swelling   Aspirin Other (See Comments)    High Doses Mess up her stomach; "makes my bowels have blood in them". Takes 81 mg EC Aspirin    Nitrofurantoin Hives   Bactrim [Sulfamethoxazole-Trimethoprim] Rash   Contrast Media [Iodinated Contrast Media] Itching   Iron Itching and Other (See Comments)    "they gave me iron in dialysis; had to give me Benadryl cause I had to have the iron" (05/02/2012)   Tylenol [Acetaminophen] Itching and Other (See Comments)    Makes her feet on fire per pt   Gabapentin Other (See Comments)    Unknown reaction   Iron Sucrose Other (See Comments)    Unknown   Ranexa [Ranolazine] Other (See Comments)    Myoclonus-hospitalized    Sucroferric Oxyhydroxide Other (See Comments)    Unknown   Dexilant [Dexlansoprazole] Other (See Comments)    Upset stomach   Hydralazine Itching    Has tolerated while inpatient Has tolerated while inpatient   Levaquin [Levofloxacin In D5w] Rash   Morphine And Related Itching and Other (See Comments)    Itching in feet   Pantoprazole Rash   Plavix [Clopidogrel Bisulfate] Rash   Protonix [Pantoprazole Sodium] Rash   Venofer [Ferric Oxide] Itching and Other (See Comments)    Patient reports using Benadryl prior to doses as [facility]    Family History  Problem  Relation Age of Onset   Heart disease Mother        Heart Disease before age 4   Hyperlipidemia Mother    Hypertension Mother    Diabetes Mother    Heart attack Mother  Heart disease Father        Heart Disease before age 72   Hyperlipidemia Father    Hypertension Father    Diabetes Father    Diabetes Sister    Hypertension Sister    Diabetes Brother    Hyperlipidemia Brother    Heart attack Brother    Hypertension Sister    Heart attack Brother    Colon cancer Child 74   Other Other        noncontributory for early CAD   Esophageal cancer Neg Hx    Liver disease Neg Hx    Kidney disease Neg Hx    Colon polyps Neg Hx     Prior to Admission medications   Medication Sig Start Date End Date Taking? Authorizing Provider  ALPRAZolam Duanne Moron) 0.5 MG tablet Take 0.5 mg by mouth daily as needed. 07/26/22   [provider]  amiodarone (PACERONE) 100 MG tablet Take 1 tablet (100 mg total) by mouth daily. 04/27/22   Nita Sells, MD  Aspirin 81 MG CAPS Take 1 capsule by mouth daily at 12 noon.    [provider]  benzonatate (TESSALON) 100 MG capsule Take 100 mg by mouth 3 (three) times daily as needed for cough. 05/10/22   [provider]  cyclobenzaprine (FLEXERIL) 5 MG tablet Take 5 mg by mouth 3 (three) times daily as needed for muscle spasms. 12/27/21   [provider]  folic acid (FOLVITE) 1 MG tablet Take 1 mg by mouth daily. 05/24/22   [provider]  hydrOXYzine (ATARAX) 25 MG tablet Take 25 mg by mouth every 6 (six) hours as needed for itching. 06/06/22   [provider]  levothyroxine (SYNTHROID) 50 MCG tablet Take 1 tablet (50 mcg total) by mouth daily before breakfast. 06/12/22   Aline August, MD  metoprolol tartrate (LOPRESSOR) 25 MG tablet Take 0.5 tablets (12.5 mg total) by mouth 2 (two) times daily. 03/04/22   Arnoldo Lenis, MD  multivitamin (RENA-VIT) TABS tablet Take 1 tablet by mouth daily.     [provider]  nitroGLYCERIN (NITROSTAT) 0.4 MG SL tablet Place 0.4 mg under the tongue every 5 (five) minutes as needed for chest pain.    [provider]  omeprazole (PRILOSEC) 20 MG capsule Take 2 capsules (40 mg total) by mouth daily as needed (acid reflux). 06/12/22   Aline August, MD  ondansetron (ZOFRAN-ODT) 4 MG disintegrating tablet Take 4 mg by mouth every 8 (eight) hours as needed for nausea or vomiting. 05/05/22   [provider]  rosuvastatin (CRESTOR) 10 MG tablet Take 1 tablet by mouth once daily 06/06/22   Ahmed Prima, [place] M, PA-C  sevelamer carbonate (RENVELA) 800 MG tablet Take 2,400 mg by mouth 3 (three) times daily. 12/27/21   [provider]    Physical Exam: Vitals:   08/06/22 1630 08/06/22 1700 08/06/22 1730 08/06/22 1800  BP: (!) 177/66 122/77 (!) 128/99 121/70  Pulse: 60 100 (!) 120 100  Resp: 18 (!) '21 20 19  '$ Temp:      TempSrc:      SpO2: 100% 97% 98% 90%  Weight:       General:  Appears calm and comfortable and is in NAD, somnolent and mostly disinterested Eyes:   EOMI, normal lids, iris ENT:  grossly normal hearing, lips & tongue, mmm Neck:  no LAD, masses or thyromegaly Cardiovascular:  RRR, no m/r/g. No LE edema.  Respiratory:   CTA bilaterally with no wheezes/rales/rhonchi.  Normal  respiratory effort. Abdomen:  soft, NT, ND Skin:  no rash or induration seen on limited exam Musculoskeletal:  no bony abnormality Psychiatric:  blunted/somnolent mood and affect, speech sparse but appropriate, AOx3 Neurologic:  CN 2-12 grossly intact, moves all extremities in coordinated fashion   Radiological Exams on Admission: Independently reviewed - see discussion in A/P where applicable  CT HEAD WO CONTRAST (5MM)  Result Date: 08/06/2022 CLINICAL DATA:  Confusion, missed hemodialysis EXAM: CT HEAD WITHOUT CONTRAST TECHNIQUE: Contiguous axial images were obtained from the base of the skull through the vertex without  intravenous contrast. RADIATION DOSE REDUCTION: This exam was performed according to the departmental dose-optimization program which includes automated exposure control, adjustment of the mA and/or kV according to patient size and/or use of iterative reconstruction technique. COMPARISON:  07/29/2022 FINDINGS: Brain: No evidence of acute infarction, hemorrhage, hydrocephalus, extra-axial collection or mass lesion/mass effect. Extensive subcortical white matter and periventricular small vessel ischemic changes. Global cortical atrophy. Vascular: Intracranial atherosclerosis. Skull: Normal. Negative for fracture or focal lesion. Sinuses/Orbits: The visualized paranasal sinuses are essentially clear. The mastoid air cells are unopacified. Other: None. IMPRESSION: No evidence of acute intracranial abnormality. Atrophy with small vessel ischemic changes. Electronically Signed   By: Julian Hy M.D.   On: 08/06/2022 02:38   DG Chest 2 View  Result Date: 08/06/2022 CLINICAL DATA:  Shortness of breath and confusion. Missed dialysis yesterday EXAM: CHEST - 2 VIEW COMPARISON:  07/14/2022 FINDINGS: Left IJ dialysis catheter tip in the right atrium. Sternotomy and CABG. Coronary stenting. Left axillary vascular stent. Stable cardiomediastinal silhouette. Aortic atherosclerotic calcification. Bibasilar atelectasis or infiltrates. Small bilateral pleural effusions. No pneumothorax. IMPRESSION: Bibasilar atelectasis or infiltrates. Cardiomegaly and small bilateral pleural effusions. Electronically Signed   By: Placido Sou M.D.   On: 08/06/2022 02:02    EKG: Independently reviewed.  NSR with rate 57; IVCD with no evidence of acute ischemia   Labs on Admission: I have personally reviewed the available labs and imaging studies at the time of the admission.  Pertinent labs:    BUN 46/Creatinine 7.3/GFR 5 Albumin 3.0 BNP 1666.2 WBC 8.6 Hgb 6.7   Assessment and Plan: Principal Problem:   Failure to thrive  in adult Active Problems:   HLD (hyperlipidemia)   Hx of CABG   ESRD (end stage renal disease) on dialysis (HCC)   Anemia associated with chronic renal failure   Paroxysmal Atrial fibrillation/Flutter    Failure to thrive -Patient with h/o ESRD and has missed a couple of recent sessions due to progressive weakness, difficulty ambulating -She has been seen in the ER twice recently (1/19, 1/25) for occipital neuralgia -Uremia in the setting of missed HD is a consideration, but her BUN is only 46 so this seems less likely -Anemia is also a concern -Will admit for further evaluation and treatment -PT/OT/ST/nutrition evaluations -Her family is providing 24/7 caregiver assistance and yet she is requiring more care over time; she may benefit from SNF placement -Will request palliative care evaluation for goals of care  -Hold sedating medications including alprazolam  ESRD -Patient on chronic MWF HD -Nephrology prn order set utilized -She does not appear to be volume overloaded or otherwise in need of acute HD -Nephrology notified that patient will need HD  -Continue Renavit, Sevelamer  Anemia -Chronic anemia related to CKD, worse today -Transfuse with 1 unit PRBC -Recheck CBC in AM  CAD -s/p CABG -Hold metoprolol, resume soon -Continue ASA  HTN -Hold metoprolol for now  HLD -Continue rosuvastatin  Afib -Rate controlled with amiodarone, will continue -Hold lopressor for now given marginal BP -Not a candidate for [facility]  Hypothyroidism -Continue Synthroid  DM -Will check A1c -Not on home meds -Cover with very sensitive-scale SSI    Advance Care Planning:   Code Status: Full Code - Code status was discussed with the patient and/or family at the time of admission.  The patient would want to receive full resuscitative measures at this time.   Consults: Nephrology; palliative care; DM coordinator; [facility] team; nutrition; PT/OT  DVT Prophylaxis: Heparin  Family  Communication: Husband was present throughout evaluation  Severity of Illness: The appropriate patient status for this patient is INPATIENT. Inpatient status is judged to be reasonable and necessary in order to provide the required intensity of service to ensure the patient's safety. The patient's presenting symptoms, physical exam findings, and initial radiographic and laboratory data in the context of their chronic comorbidities is felt to place them at high risk for further clinical deterioration. Furthermore, it is not anticipated that the patient will be medically stable for discharge from the hospital within 2 midnights of admission.   * I certify that at the point of admission it is my clinical judgment that the patient will require inpatient hospital care spanning beyond 2 midnights from the point of admission due to high intensity of service, high risk for further deterioration and high frequency of surveillance required.*  Author: Karmen Bongo, MD 08/06/2022 6:24 PM  For on call review [email or web address].

## 2022-08-06 NOTE — ED Triage Notes (Signed)
Patient's daughter reported that she missed her hemodialysis today due to generalized weakness/confusion .

## 2022-08-06 NOTE — Progress Notes (Signed)
New Admission Note:  Arrival Method: By bed from HD around 2000 Mental Orientation: Alert and oriented x3, forgetful Telemetry: [address], CCMD notified Assessment: Completed Skin: Completed, refer to flowsheets IV: Right forearm S.L. Pain: Denies Tubes: Left chest HD cath Safety Measures: Safety Fall Prevention Plan was given, discussed and signed. Admission: Completed [facility] Orientation: Patient has been orientated to the room, unit and the staff. Family: None  Orders have been reviewed and implemented. Will continue to monitor the patient. Call light has been placed within reach and bed alarm has been activated.   Fara Olden, RN  Phone Number: [phone]

## 2022-08-07 DIAGNOSIS — R627 Adult failure to thrive: Secondary | ICD-10-CM | POA: Diagnosis not present

## 2022-08-07 LAB — GLUCOSE, CAPILLARY
Glucose-Capillary: 94 mg/dL (ref 70–99)
Glucose-Capillary: 124 mg/dL — ABNORMAL HIGH (ref 70–99)
Glucose-Capillary: 107 mg/dL — ABNORMAL HIGH (ref 70–99)
Glucose-Capillary: 132 mg/dL — ABNORMAL HIGH (ref 70–99)

## 2022-08-07 LAB — BASIC METABOLIC PANEL
Anion gap: 15 (ref 5–15)
BUN: 36 mg/dL — ABNORMAL HIGH (ref 8–23)
CO2: 22 mmol/L (ref 22–32)
Calcium: 9 mg/dL (ref 8.9–10.3)
Chloride: 96 mmol/L — ABNORMAL LOW (ref 98–111)
Creatinine, Ser: 5.33 mg/dL — ABNORMAL HIGH (ref 0.44–1.00)
GFR, Estimated: 8 mL/min — ABNORMAL LOW (ref >60)
Glucose, Bld: 122 mg/dL — ABNORMAL HIGH (ref 70–99)
Potassium: 3.7 mmol/L (ref 3.5–5.1)
Sodium: 133 mmol/L — ABNORMAL LOW (ref 135–145)

## 2022-08-07 LAB — IRON AND TIBC
Iron: 63 ug/dL (ref 28–170)
Saturation Ratios: 27 % (ref 10.4–31.8)
TIBC: 234 ug/dL — ABNORMAL LOW (ref 250–450)
UIBC: 171 ug/dL

## 2022-08-07 LAB — CBC
HCT: 27.6 % — ABNORMAL LOW (ref 36.0–46.0)
Hemoglobin: 9 g/dL — ABNORMAL LOW (ref 12.0–15.0)
MCH: 35.2 pg — ABNORMAL HIGH (ref 26.0–34.0)
MCHC: 32.6 g/dL (ref 30.0–36.0)
MCV: 107.8 fL — ABNORMAL HIGH (ref 80.0–100.0)
Platelets: 289 10*3/uL (ref 150–400)
RBC: 2.56 MIL/uL — ABNORMAL LOW (ref 3.87–5.11)
RDW: 23.2 % — ABNORMAL HIGH (ref 11.5–15.5)
WBC: 8.9 10*3/uL (ref 4.0–10.5)
nRBC: 0 % (ref 0.0–0.2)

## 2022-08-07 LAB — RETICULOCYTES
Immature Retic Fract: 24.2 % — ABNORMAL HIGH (ref 2.3–15.9)
RBC.: 2.44 MIL/uL — ABNORMAL LOW (ref 3.87–5.11)
Retic Count, Absolute: 72.5 10*3/uL (ref 19.0–186.0)
Retic Ct Pct: 3 % (ref 0.4–3.1)

## 2022-08-07 LAB — VITAMIN B12: Vitamin B-12: 1981 pg/mL — ABNORMAL HIGH (ref 180–914)

## 2022-08-07 LAB — HEMOGLOBIN A1C
Hgb A1c MFr Bld: 5.5 % (ref 4.8–5.6)
Mean Plasma Glucose: 111.15 mg/dL

## 2022-08-07 LAB — OCCULT BLOOD X 1 CARD TO LAB, STOOL: Fecal Occult Bld: POSITIVE — AB

## 2022-08-07 LAB — TSH: TSH: 5.919 u[IU]/mL — ABNORMAL HIGH (ref 0.350–4.500)

## 2022-08-07 LAB — FOLATE: Folate: >40 ng/mL (ref >5.9)

## 2022-08-07 LAB — FERRITIN: Ferritin: 912 ng/mL — ABNORMAL HIGH (ref 11–307)

## 2022-08-07 MED ORDER — FENTANYL CITRATE PF 50 MCG/ML IJ SOSY
25.0000 ug | PREFILLED_SYRINGE | INTRAMUSCULAR | Status: DC | PRN
  Administered 2022-08-10: 25 ug via INTRAVENOUS
  Filled 2022-08-07: qty 1

## 2022-08-07 MED ORDER — ORAL CARE MOUTH RINSE: 15.0000 mL | OROMUCOSAL | Status: DC | PRN

## 2022-08-07 MED ORDER — ACETAMINOPHEN 500 MG PO TABS
500.0000 mg | ORAL_TABLET | Freq: Two times a day (BID) | ORAL | Status: DC
  Administered 2022-08-07 – 2022-08-17 (×17): 500 mg via ORAL
  Filled 2022-08-07 (×20): qty 1

## 2022-08-07 MED ORDER — METOPROLOL TARTRATE 12.5 MG HALF TABLET
12.5000 mg | ORAL_TABLET | Freq: Two times a day (BID) | ORAL | Status: DC
  Administered 2022-08-07 – 2022-08-17 (×19): 12.5 mg via ORAL
  Filled 2022-08-07 (×19): qty 1

## 2022-08-07 MED ORDER — PROSOURCE PLUS PO LIQD
30.0000 mL | Freq: Two times a day (BID) | ORAL | Status: DC
  Administered 2022-08-07 – 2022-08-17 (×12): 30 mL via ORAL
  Filled 2022-08-07 (×16): qty 30

## 2022-08-07 MED ORDER — NEPRO/CARBSTEADY PO LIQD
237.0000 mL | ORAL | Status: DC
  Administered 2022-08-10 – 2022-08-16 (×7): 237 mL via ORAL

## 2022-08-07 MED ORDER — DICLOFENAC SODIUM 1 % EX GEL
2.0000 g | Freq: Two times a day (BID) | CUTANEOUS | Status: DC
  Administered 2022-08-07 – 2022-08-17 (×12): 2 g via TOPICAL
  Filled 2022-08-07: qty 100

## 2022-08-07 MED ORDER — FAMOTIDINE 20 MG PO TABS
20.0000 mg | ORAL_TABLET | Freq: Every day | ORAL | Status: DC
  Administered 2022-08-07: 20 mg via ORAL
  Filled 2022-08-07: qty 1

## 2022-08-07 NOTE — Progress Notes (Signed)
Initial Nutrition Assessment RD working remotely.   DOCUMENTATION CODES:   Not applicable  INTERVENTION:  - ordered Nepro Shake po once/day, each supplement provides 425 kcal and 19 grams protein.  - ordered 30 ml Prosource Plus BID, each supplement provides 100 kcal and 15 grams protein.   - complete NFPE when feasible.   NUTRITION DIAGNOSIS:   Increased nutrient needs related to chronic illness as evidenced by estimated needs.  GOAL:   Patient will meet greater than or equal to 90% of their needs  MONITOR:   PO intake, Supplement acceptance, Labs, Weight trends  REASON FOR ASSESSMENT:   Consult Assessment of nutrition requirement/status (failure to thrive)  ASSESSMENT:   82 year old female with medical history of ESRD on hemodialysis MWF, CAD s/p CABG, chronic systolic heart failure, HLD, HTN, A-fib, GERD, arthritis, gout, iron deficiency anemia, TIA, anxiety, ovarian cancer (1992), colon cancer (1992), mitral valve disease, and tricuspid valve disease. She presented to the ED due to generalized weakness. Her husband reports that patient was complaining of severe headache around the ear and down the neck, difficulty turning her neck, difficulty walking. Symptoms started about a week ago. She had a couple of shots in her neck on 1/19 due to dx of occipital neuralgia and she was given a pill for it on 1/25. Pain improved but not resolved. Patient did miss HD.  She is noted to be a/o to self only. Chart review indicates patient ate 100% of a late dinner (?) last night; no other meal intake percentages documented.   Patient was last assessed by a [place] RD on 11/15/21 and 11/19/21. At that time, patient met criteria for moderate malnutrition related to acute on chronic illness as evidenced by mild fat depletions, moderate muscle depletions.  Weight today is 143 lb and weight 03/07/22-06/17/22 was ~128 lb. Moderate pitting edema to BLE documented in the edema section of flow  sheet.   Labs reviewed; CBGs: 94 and 124 mg/dl, Na: 133 mmol/l, Cl: 96 mmol/l, BUN: 36 mg/dl, creatinine: 5.33 mg/dl, GFR: 8 ml/min.  Medications reviewed; 20 mg pepcid/day, 1 mg folvite/day, sliding scale novolog, 50 mcg oral synthroid/day, 1 tablet rena-vite/day, 2400 mg renvela TID.    NUTRITION - FOCUSED PHYSICAL EXAM:  RD working remotely.  Diet Order:   Diet Order             Diet renal with fluid restriction Fluid restriction: 1200 mL Fluid; Room service appropriate? Yes; Fluid consistency: Thin  Diet effective ____                   EDUCATION NEEDS:   No education needs have been identified at this time  Skin:  Skin Assessment: Reviewed RN Assessment  Last BM:  1/28 (type 4 x1, large amount)  Height:   Ht Readings from Last 1 Encounters:  08/06/22 '5\' 2"'$  (1.575 m)    Weight:   Wt Readings from Last 1 Encounters:  08/07/22 64.7 kg     BMI:  Body mass index is 26.08 kg/m.  Estimated Nutritional Needs:  Kcal:  1620-1950 kcal Protein:  80-95 grams Fluid:  UOP + 1 L/day      Jarome Matin, MS, RD, LDN, CNSC Clinical Dietitian PRN/Relief staff On-call/weekend pager # available in [facility]

## 2022-08-07 NOTE — Progress Notes (Signed)
PROGRESS NOTE    Shawna [name]  HFW:[id number] DOB: 12/16/1940 DOA: 08/06/2022 PCP: [facility]   Brief Narrative: 82 year old with past medical history significant for ESRD on hemodialysis MWF, CAD status post CABG, chronic systolic heart failure, hyperlipidemia, hypertension, A-fib presented with generalized weakness.  Her husband reports patient been complaining of severe headache around the ear and down the neck, difficulty turning her neck, difficulty walking.  Symptoms started about a week ago.  She had a couple of shots in her neck on 1/19 and she was given a pill for it on 1/25.  It is eased some  but is still.  Patient missed HD.  Patient was last admitted 12/1-3 for anemia associated with ABLA from her fistula, not on AC despite A-fib.  She returned 12/8 with cough treated with antibiotics with in the terminated chest x-ray.  She returned 1/19 with right-sided headache, diagnosed with occipital neuralgia and treated with nerve block.  Return 1 8/25 with neck pain attributed to the same.     Assessment & Plan:   Principal Problem:   Failure to thrive in adult Active Problems:   HLD (hyperlipidemia)   Hx of CABG   ESRD (end stage renal disease) on dialysis (HCC)   Anemia associated with chronic renal failure   Paroxysmal Atrial fibrillation/Flutter   1-FTT: -Patient with h/o ESRD, she has missed a couple of recent sessions due to progressive weakness, difficulty ambulating. -She has been seen in the ER twice recently (1/19 and 1/25) for occipital neuralgia -Anemia, received blood transfusion.  -Palliative care consulted for goals of care, full scope of treatment -PT OT eval   ESRD on HD: Hemodialysis MWF Nephrology consulted Continue Rena-Vite and  Sevelamer  Anemia:  Anemia of CKD 1 unit of packed red blood cell order Hb increase to 9/  Monitor for melena.  Check for occult.   CAD:  Status post CABG Holding metoprolol due to soft low blood  pressure Continue with aspirin  HTN:  Holding metoprolol due to soft blood pressure  A fib;  Continue with amiodarone Not a candidate for [facility]  Hypothyroidism:  Continue with Synthroid  DM;  A1c: Continue with a sliding scale insulin  Headaches, Occipital neuralgia. Received nerve block Plan to schedule tylenol. Start voltaren gel. , heating patch.     Estimated body mass index is 26.08 kg/m as calculated from the following:   Height as of this encounter: '5\' 2"'$  (1.575 m).   Weight as of this encounter: 64.7 kg.   DVT prophylaxis: Heparin  Code Status: Full code Family Communication: care discussed  Disposition Plan:  Status is: Inpatient Remains inpatient appropriate because: management of AMS. Anemia     Consultants:  Nephrology   Procedures:    Antimicrobials:    Subjective: She is alert, report mild headaches, neck pain   Objective: Vitals:   08/06/22 2015 08/06/22 2042 08/07/22 0500 08/07/22 0537  BP: (!) 115/90   (!) 167/64  Pulse: 70   69  Resp: 16   17  Temp: 98.4 F (36.9 C)   98.6 F (37 C)  TempSrc:    Oral  SpO2: 91%   100%  Weight:   64.7 kg   Height:  '5\' 2"'$  (1.575 m)      Intake/Output Summary (Last 24 hours) at 08/07/2022 0714 Last data filed at 08/07/2022 0537 Gross per 24 hour  Intake 540 ml  Output 2900 ml  Net -2360 ml   Filed Weights   08/06/22  1526 08/07/22 0500  Weight: 64.7 kg 64.7 kg    Examination:  General exam: Appears calm and comfortable  Respiratory system: Clear to auscultation. Respiratory effort normal. Cardiovascular system: S1 & S2 heard, RRR. No JVD, murmurs, rubs, gallops or clicks. No pedal edema. Gastrointestinal system: Abdomen is nondistended, soft and nontender. No organomegaly or masses felt. Normal bowel sounds heard. Central nervous system: Alert and oriented.  Extremities: Symmetric 5 x 5 power.   Data Reviewed: I have personally reviewed following labs and imaging studies  CBC: Recent  Labs  Lab 08/06/22 0139  WBC 8.6  NEUTROABS 6.2  HGB 6.7*  HCT 21.1*  MCV 117.9*  PLT 413   Basic Metabolic Panel: Recent Labs  Lab 08/06/22 0139  NA 138  K 4.1  CL 99  CO2 25  GLUCOSE 113*  BUN 46*  CREATININE 7.30*  CALCIUM 9.2   GFR: Estimated Creatinine Clearance: 5.2 mL/min (A) (by C-G formula based on SCr of 7.3 mg/dL (H)). Liver Function Tests: Recent Labs  Lab 08/06/22 0139  AST 11*  ALT 16  ALKPHOS 99  BILITOT 0.5  PROT 6.5  ALBUMIN 3.0*   No results for input(s): "LIPASE", "AMYLASE" in the last 168 hours. No results for input(s): "AMMONIA" in the last 168 hours. Coagulation Profile: No results for input(s): "INR", "PROTIME" in the last 168 hours. Cardiac Enzymes: No results for input(s): "CKTOTAL", "CKMB", "CKMBINDEX", "TROPONINI" in the last 168 hours. BNP (last 3 results) No results for input(s): "PROBNP" in the last 8760 hours. HbA1C: No results for input(s): "HGBA1C" in the last 72 hours. CBG: Recent Labs  Lab 08/06/22 0118 08/06/22 2013  GLUCAP 160* 95   Lipid Profile: No results for input(s): "CHOL", "HDL", "LDLCALC", "TRIG", "CHOLHDL", "LDLDIRECT" in the last 72 hours. Thyroid Function Tests: No results for input(s): "TSH", "T4TOTAL", "FREET4", "T3FREE", "THYROIDAB" in the last 72 hours. Anemia Panel: No results for input(s): "VITAMINB12", "FOLATE", "FERRITIN", "TIBC", "IRON", "RETICCTPCT" in the last 72 hours. Sepsis Labs: No results for input(s): "PROCALCITON", "LATICACIDVEN" in the last 168 hours.  No results found for this or any previous visit (from the past 240 hour(s)).       Radiology Studies: CT HEAD WO CONTRAST (5MM)  Result Date: 08/06/2022 CLINICAL DATA:  Confusion, missed hemodialysis EXAM: CT HEAD WITHOUT CONTRAST TECHNIQUE: Contiguous axial images were obtained from the base of the skull through the vertex without intravenous contrast. RADIATION DOSE REDUCTION: This exam was performed according to the departmental  dose-optimization program which includes automated exposure control, adjustment of the mA and/or kV according to patient size and/or use of iterative reconstruction technique. COMPARISON:  07/29/2022 FINDINGS: Brain: No evidence of acute infarction, hemorrhage, hydrocephalus, extra-axial collection or mass lesion/mass effect. Extensive subcortical white matter and periventricular small vessel ischemic changes. Global cortical atrophy. Vascular: Intracranial atherosclerosis. Skull: Normal. Negative for fracture or focal lesion. Sinuses/Orbits: The visualized paranasal sinuses are essentially clear. The mastoid air cells are unopacified. Other: None. IMPRESSION: No evidence of acute intracranial abnormality. Atrophy with small vessel ischemic changes. Electronically Signed   By: Julian Hy M.D.   On: 08/06/2022 02:38   DG Chest 2 View  Result Date: 08/06/2022 CLINICAL DATA:  Shortness of breath and confusion. Missed dialysis yesterday EXAM: CHEST - 2 VIEW COMPARISON:  07/14/2022 FINDINGS: Left IJ dialysis catheter tip in the right atrium. Sternotomy and CABG. Coronary stenting. Left axillary vascular stent. Stable cardiomediastinal silhouette. Aortic atherosclerotic calcification. Bibasilar atelectasis or infiltrates. Small bilateral pleural effusions. No pneumothorax. IMPRESSION: Bibasilar atelectasis  or infiltrates. Cardiomegaly and small bilateral pleural effusions. Electronically Signed   By: Placido Sou M.D.   On: 08/06/2022 02:02        Scheduled Meds:  sodium chloride   Intravenous Once   amiodarone  100 mg Oral Daily   aspirin EC  81 mg Oral Daily   Chlorhexidine Gluconate Cloth  6 each Topical Y1017   folic acid  1 mg Oral Daily   heparin  5,000 Units Subcutaneous Q8H   insulin aspart  0-6 Units Subcutaneous TID WC   levothyroxine  50 mcg Oral QAC breakfast   multivitamin  1 tablet Oral Daily   rosuvastatin  10 mg Oral Daily   sevelamer carbonate  2,400 mg Oral TID with meals    sodium chloride flush  3 mL Intravenous Q12H   Continuous Infusions:  anticoagulant sodium citrate       LOS: 1 day    Time spent: 35 minutes    Jakiah Bienaime A Fifi Schindler, MD [facility]   If 7PM-7AM, please contact night-coverage [email or web address]  08/07/2022, 7:14 AM

## 2022-08-07 NOTE — Progress Notes (Signed)
[facility] Progress Note    Assessment/ Plan:   ESRD: -Outpatient orders: J[facility] Docker, MWF. 3.5hrs. Nipro. 300 BFR. 2k/2,5cal. TDC. Mircera 123mg (last dose 08/01/22) -HD on MWF schedule. HD tomorrow  FTT -per primary service. Palliative consulted: full scope of care   Volume/ hypertension: EDW 59.5kg. Will UF as tolerated   Anemia of Chronic Kidney Disease, symptomatic anemia: Hemoglobin 6.7 on presentation, transfuse PRN for hgb <7. Just received ESA as an outpatient, will redose accordingly. F/u cbc?   Secondary Hyperparathyroidism/Hyperphosphatemia: resume home binders   Subjective:   No acute events overnight. Patient reports that she tolerated HD yesterday, feels like legs are little less swollen today   Objective:   BP (!) 167/57 (BP Location: Right Arm, Patient Position: Sitting)   Pulse 74   Temp 97.6 F (36.4 C) (Oral)   Resp 19   Ht '5\' 2"'$  (1.575 m)   Wt 64.7 kg   SpO2 100%   BMI 26.08 kg/m   Intake/Output Summary (Last 24 hours) at 08/07/2022 1110 Last data filed at 08/07/2022 07494Gross per 24 hour  Intake 540 ml  Output 2900 ml  Net -2360 ml   Weight change:   Physical Exam: Gen: NAD CVS: RRR Resp: normal WOB Abd: soft, nt/nd Ext: 2+ pitting edema b/l Les Neuro: awake, interactive Dialysis access: Left chest TDC c/d/I, LUE AVG pulsatile  Imaging: CT HEAD WO CONTRAST (5MM)  Result Date: 08/06/2022 CLINICAL DATA:  Confusion, missed hemodialysis EXAM: CT HEAD WITHOUT CONTRAST TECHNIQUE: Contiguous axial images were obtained from the base of the skull through the vertex without intravenous contrast. RADIATION DOSE REDUCTION: This exam was performed according to the departmental dose-optimization program which includes automated exposure control, adjustment of the mA and/or kV according to patient size and/or use of iterative reconstruction technique. COMPARISON:  07/29/2022 FINDINGS: Brain: No evidence of acute infarction, hemorrhage,  hydrocephalus, extra-axial collection or mass lesion/mass effect. Extensive subcortical white matter and periventricular small vessel ischemic changes. Global cortical atrophy. Vascular: Intracranial atherosclerosis. Skull: Normal. Negative for fracture or focal lesion. Sinuses/Orbits: The visualized paranasal sinuses are essentially clear. The mastoid air cells are unopacified. Other: None. IMPRESSION: No evidence of acute intracranial abnormality. Atrophy with small vessel ischemic changes. Electronically Signed   By: SJulian HyM.D.   On: 08/06/2022 02:38   DG Chest 2 View  Result Date: 08/06/2022 CLINICAL DATA:  Shortness of breath and confusion. Missed dialysis yesterday EXAM: CHEST - 2 VIEW COMPARISON:  07/14/2022 FINDINGS: Left IJ dialysis catheter tip in the right atrium. Sternotomy and CABG. Coronary stenting. Left axillary vascular stent. Stable cardiomediastinal silhouette. Aortic atherosclerotic calcification. Bibasilar atelectasis or infiltrates. Small bilateral pleural effusions. No pneumothorax. IMPRESSION: Bibasilar atelectasis or infiltrates. Cardiomegaly and small bilateral pleural effusions. Electronically Signed   By: TPlacido SouM.D.   On: 08/06/2022 02:02    Labs: BMET Recent Labs  Lab 08/06/22 0139  NA 138  K 4.1  CL 99  CO2 25  GLUCOSE 113*  BUN 46*  CREATININE 7.30*  CALCIUM 9.2   CBC Recent Labs  Lab 08/06/22 0139  WBC 8.6  NEUTROABS 6.2  HGB 6.7*  HCT 21.1*  MCV 117.9*  PLT 309    Medications:     sodium chloride   Intravenous Once   acetaminophen  500 mg Oral BID   amiodarone  100 mg Oral Daily   aspirin EC  81 mg Oral Daily   Chlorhexidine Gluconate Cloth  6 each Topical [id number]  folic acid  1 mg Oral Daily   heparin  5,000 Units Subcutaneous Q8H   insulin aspart  0-6 Units Subcutaneous TID WC   levothyroxine  50 mcg Oral QAC breakfast   metoprolol tartrate  12.5 mg Oral BID   multivitamin  1 tablet Oral Daily   rosuvastatin  10 mg  Oral Daily   sevelamer carbonate  2,400 mg Oral TID with meals   sodium chloride flush  3 mL Intravenous Q12H      Gean Quint, MD [facility] 08/07/2022, 11:10 AM

## 2022-08-07 NOTE — Evaluation (Signed)
Physical Therapy Evaluation Patient Details Name: Shawna [name] MRN: [id number] DOB: 1940/10/30 Today's Date: 08/07/2022  History of Present Illness  82 year old presented with generalized weakness, difficulty walking, severe HA, difficulty turning her neck; Symptoms started about a week prior to this admission; with past medical history significant for ESRD on hemodialysis MWF, CAD status post CABG, chronic systolic heart failure, hyperlipidemia, hypertension, A-fib  Clinical Impression   Pt admitted with above diagnosis. Lives at home home with family, in a single-level home with a few steps to enter; At her normal, pt typically walked with a cane in the home, managed steps with assist, and used a wheelchair to get to car where husband assisted with car transfers; In the week leading up to admission, pt was unable to walk; Presents to PT with generalized weakness, functional dependencies, and pleasantly confused; Min assist to get up to EOB, Mod assist of 2 to stand and take pivot steps bed to recliner;  Pt currently with functional limitations due to the deficits listed below (see PT Problem List). Pt will benefit from skilled PT to increase their independence and safety with mobility to allow discharge to the venue listed below.          Recommendations for follow up therapy are one component of a multi-disciplinary discharge planning process, led by the attending physician.  Recommendations may be updated based on patient status, additional functional criteria and insurance authorization.  Follow Up Recommendations Skilled nursing-short term rehab (<3 hours/day) (Request not [facility], where she was previously) Can patient physically be transported by private vehicle: No (perhaps soon)    Assistance Recommended at Discharge Frequent or constant Supervision/Assistance  Patient can return home with the following  A little help with walking and/or transfers;A little help with  bathing/dressing/bathroom;Assistance with cooking/housework;Assist for transportation;Help with stairs or ramp for entrance    Equipment Recommendations Rolling walker (2 wheels);BSC/3in1  Recommendations for Other Services       Functional Status Assessment Patient has had a recent decline in their functional status and demonstrates the ability to make significant improvements in function in a reasonable and predictable amount of time.     Precautions / Restrictions Precautions Precautions: Fall      Mobility  Bed Mobility Overal bed mobility: Needs Assistance Bed Mobility: Supine to Sit     Supine to sit: Min assist     General bed mobility comments: Cues to initaite, and to scoot fully to EOB    Transfers Overall transfer level: Needs assistance Equipment used: Rolling walker (2 wheels) Transfers: Sit to/from Stand, Bed to chair/wheelchair/BSC Sit to Stand: Mod assist, +2 safety/equipment   Step pivot transfers: Min assist, +2 safety/equipment       General transfer comment: Stood from bed to RW with mod assist of 2 and cues for forward lean; 2 person assist for safety with pivot steps bed to recliner on her R    Ambulation/Gait                  Stairs            Wheelchair Mobility    Modified Rankin (Stroke Patients Only)       Balance Overall balance assessment: Needs assistance   Sitting balance-Leahy Scale: Fair       Standing balance-Leahy Scale: Poor                               Pertinent  Vitals/Pain Pain Assessment Pain Assessment: Faces Faces Pain Scale: No hurt Pain Intervention(s): Monitored during session    Home Living Family/patient expects to be discharged to:: Private residence Living Arrangements: Spouse/significant other Available Help at Discharge: Family;Available 24 hours/day Type of Home: House Home Access: Stairs to enter Entrance Stairs-Rails: Right Entrance Stairs-Number of Steps: 2    Home Layout: One level Home Equipment: Rollator (4 wheels);Cane - single point;Shower seat;BSC/3in1      Prior Function Prior Level of Function : Needs assist             Mobility Comments: cane PRN; has not been walking x1 week PTA ADLs Comments: assisted by family, ahs tub seat     Hand Dominance   Dominant Hand: Right    Extremity/Trunk Assessment   Upper Extremity Assessment Upper Extremity Assessment: Defer to OT evaluation    Lower Extremity Assessment Lower Extremity Assessment: Generalized weakness       Communication   Communication: No difficulties  Cognition Arousal/Alertness: Awake/alert Behavior During Therapy: WFL for tasks assessed/performed Overall Cognitive Status: Impaired/Different from baseline Area of Impairment: Orientation, Memory, Safety/judgement, Awareness                 Orientation Level: Disoriented to, Place, Time, Situation   Memory: Decreased short-term memory   Safety/Judgement: Decreased awareness of safety, Decreased awareness of deficits     General Comments: Pleasantly confused, but following all cues related to mobility, transfers, ADLs; Told OT she was at [place] comments (skin integrity, edema, etc.): Reported her head felt dizzy/funny sitting EOB and in recliner after step pivot transfer; BP in recliner 150/59, MAP 84    Exercises     Assessment/Plan    PT Assessment Patient needs continued PT services  PT Problem List Decreased strength;Decreased activity tolerance;Decreased balance;Decreased mobility;Decreased coordination;Decreased cognition;Decreased knowledge of use of DME;Decreased safety awareness;Decreased knowledge of precautions       PT Treatment Interventions DME instruction;Gait training;Stair training;Functional mobility training;Therapeutic activities;Therapeutic exercise;Balance training;Neuromuscular re-education;Patient/family education    PT Goals (Current  goals can be found in the Care Plan section)  Acute Rehab PT Goals Patient Stated Goal: did not state, but agreeable to getting up PT Goal Formulation: With patient/family Time For Goal Achievement: 08/07/22 Potential to Achieve Goals: Good    Frequency Min 2X/week     Co-evaluation PT/OT/SLP Co-Evaluation/Treatment: Yes Reason for Co-Treatment: To address functional/ADL transfers PT goals addressed during session: Mobility/safety with mobility         AM-PAC PT "6 Clicks" Mobility  Outcome Measure Help needed turning from your back to your side while in a flat bed without using bedrails?: A Little Help needed moving from lying on your back to sitting on the side of a flat bed without using bedrails?: A Little Help needed moving to and from a bed to a chair (including a wheelchair)?: A Lot Help needed standing up from a chair using your arms (e.g., wheelchair or bedside chair)?: A Lot Help needed to walk in hospital room?: Total Help needed climbing 3-5 steps with a railing? : Total 6 Click Score: 12    End of Session Equipment Utilized During Treatment: Gait belt Activity Tolerance: Patient tolerated treatment well Patient left: in chair;with call bell/phone within reach;with chair alarm set Nurse Communication: Mobility status PT Visit Diagnosis: Unsteadiness on feet (R26.81);Other abnormalities of gait and mobility (R26.89)    Time: 6644-0347 PT Time Calculation (min) (ACUTE ONLY): 30 min  Charges:   PT Evaluation $PT Eval Moderate Complexity: [place], PT  Acute Rehabilitation Services Office [phone]   Colletta [place] 08/07/2022, 4:16 PM

## 2022-08-07 NOTE — Evaluation (Signed)
Occupational Therapy Evaluation Patient Details Name: Shawna [name] MRN: [id number] DOB: 1941-03-05 Today's Date: 08/07/2022   History of Present Illness 82 year old presented with generalized weakness, difficulty walking, severe HA, difficulty turning her neck; Symptoms started about a week prior to this admission; with past medical history significant for ESRD on hemodialysis MWF, CAD status post CABG, chronic systolic heart failure, hyperlipidemia, hypertension, A-fib   Clinical Impression   PTA, pt typically uses cane for mobility and family assisted with ADLs, although per spouse report, pt unable to ambulate 1 week prior to hospital admission. Pt currently oriented to self only, follows commands appropriately and able to accurately provide some home setup. Pt needing min-mod A for ADLs, min A for bed mobility and mod A +2 for step pivot transfer to chair. Pt reporting dizziness with sitting/standing, however BP WNL 150/59 (84), RN notified. Pt presenting with impairments listed below, will follow acutely. Recommend SNF at d/c.     Recommendations for follow up therapy are one component of a multi-disciplinary discharge planning process, led by the attending physician.  Recommendations may be updated based on patient status, additional functional criteria and insurance authorization.   Follow Up Recommendations  Skilled nursing-short term rehab (<3 hours/day)     Assistance Recommended at Discharge Frequent or constant Supervision/Assistance  Patient can return home with the following A lot of help with walking and/or transfers;A lot of help with bathing/dressing/bathroom;Assistance with cooking/housework;Direct supervision/assist for medications management;Direct supervision/assist for financial management;Assist for transportation;Help with stairs or ramp for entrance    Functional Status Assessment  Patient has had a recent decline in their functional status and demonstrates the ability  to make significant improvements in function in a reasonable and predictable amount of time.  Equipment Recommendations  Other (comment) (defer)    Recommendations for Other Services PT consult     Precautions / Restrictions Precautions Precautions: Fall Restrictions Weight Bearing Restrictions: No      Mobility Bed Mobility Overal bed mobility: Needs Assistance Bed Mobility: Supine to Sit     Supine to sit: Min assist     General bed mobility comments: Cues to initaite, and to scoot fully to EOB    Transfers Overall transfer level: Needs assistance Equipment used: Rolling walker (2 wheels) Transfers: Sit to/from Stand, Bed to chair/wheelchair/BSC Sit to Stand: Mod assist, +2 safety/equipment     Step pivot transfers: Min assist, +2 safety/equipment     General transfer comment: Stood from bed to RW with mod assist of 2 and cues for forward lean; 2 person assist for safety with pivot steps bed to recliner on her R      Balance Overall balance assessment: Needs assistance Sitting-balance support: Feet supported Sitting balance-Leahy Scale: Fair       Standing balance-Leahy Scale: Poor Standing balance comment: reliant on external support                           ADL either performed or assessed with clinical judgement   ADL Overall ADL's : Needs assistance/impaired Eating/Feeding: Set up   Grooming: Minimal assistance   Upper Body Bathing: Minimal assistance   Lower Body Bathing: Moderate assistance   Upper Body Dressing : Minimal assistance   Lower Body Dressing: Moderate assistance   Toilet Transfer: Moderate assistance;+2 for physical assistance;Rolling walker (2 wheels);BSC/3in1;Stand-pivot Toilet Transfer Details (indicate cue type and reason): simulated via functional mobility         Functional mobility during ADLs: Moderate  assistance;Rolling walker (2 wheels)       Vision   Vision Assessment?: No apparent visual deficits      Perception Perception Perception Tested?: No   Praxis Praxis Praxis tested?: Not tested    Pertinent Vitals/Pain Pain Assessment Pain Assessment: No/denies pain     Hand Dominance Right   Extremity/Trunk Assessment Upper Extremity Assessment Upper Extremity Assessment: Generalized weakness   Lower Extremity Assessment Lower Extremity Assessment: Defer to PT evaluation       Communication Communication Communication: No difficulties   Cognition Arousal/Alertness: Awake/alert Behavior During Therapy: WFL for tasks assessed/performed Overall Cognitive Status: Impaired/Different from baseline Area of Impairment: Orientation, Memory, Safety/judgement, Awareness                 Orientation Level: Disoriented to, Place, Time, Situation   Memory: Decreased short-term memory   Safety/Judgement: Decreased awareness of safety, Decreased awareness of deficits     General Comments: Thinks it is November, stating she is at [employer] or something" becoming slightly agitated when reoriented, stating she is at the hospital     General Comments  BP 150/59 (84) post-transfer    Exercises     Shoulder Instructions      Home Living Family/patient expects to be discharged to:: Private residence Living Arrangements: Spouse/significant other Available Help at Discharge: Family;Available 24 hours/day Type of Home: House Home Access: Stairs to enter [employer] of Steps: 2 Entrance Stairs-Rails: Right Home Layout: One level     Bathroom Shower/Tub: Teacher, early years/pre: Handicapped height Bathroom Accessibility: Yes   Home Equipment: Rollator (4 wheels);Cane - single point;Shower seat;BSC/3in1          Prior Functioning/Environment Prior Level of Function : Needs assist             Mobility Comments: cane PRN; has not been walking x1 week PTA ADLs Comments: family assists        OT Problem List: Decreased strength;Decreased  range of motion;Decreased activity tolerance;Impaired balance (sitting and/or standing);Decreased cognition;Decreased safety awareness      OT Treatment/Interventions: Self-care/ADL training;Therapeutic exercise;Energy conservation;DME and/or AE instruction;Therapeutic activities;Patient/family education;Balance training;Cognitive remediation/compensation    OT Goals(Current goals can be found in the care plan section) Acute Rehab OT Goals Patient Stated Goal: none stated OT Goal Formulation: With patient Time For Goal Achievement: 08/21/22 Potential to Achieve Goals: Good ADL Goals Pt Will Perform Upper Body Dressing: sitting;with min guard assist Pt Will Perform Lower Body Dressing: with min assist;sitting/lateral leans;sit to/from stand Pt Will Transfer to Toilet: with min assist;ambulating;regular height toilet Pt Will Perform Tub/Shower Transfer: Tub transfer;Shower transfer;with min assist;ambulating;shower seat  OT Frequency: Min 2X/week    Co-evaluation PT/OT/SLP Co-Evaluation/Treatment: Yes Reason for Co-Treatment: To address functional/ADL transfers;For patient/therapist safety PT goals addressed during session: Mobility/safety with mobility OT goals addressed during session: ADL's and self-care      AM-PAC OT "6 Clicks" Daily Activity     Outcome Measure Help from another person eating meals?: None Help from another person taking care of personal grooming?: A Little Help from another person toileting, which includes using toliet, bedpan, or urinal?: A Little Help from another person bathing (including washing, rinsing, drying)?: A Lot Help from another person to put on and taking off regular upper body clothing?: A Little Help from another person to put on and taking off regular lower body clothing?: A Lot 6 Click Score: 17   End of Session Equipment Utilized During Treatment: Gait belt;Rolling walker (2 wheels) Nurse Communication: Mobility  status  Activity Tolerance:  Patient tolerated treatment well Patient left: in chair;with call bell/phone within reach;with chair alarm set  OT Visit Diagnosis: Unsteadiness on feet (R26.81);Other abnormalities of gait and mobility (R26.89);Muscle weakness (generalized) (M62.81)                Time: 9969-2493 OT Time Calculation (min): 24 min Charges:  OT General Charges $OT Visit: 1 Visit OT Evaluation $OT Eval Moderate Complexity: 1 Mod  Donevan Biller K, OTD, OTR/L SecureChat Preferred Acute Rehab [phone]  Shawna [name] 08/07/2022, 4:36 PM

## 2022-08-08 DIAGNOSIS — D649 Anemia, unspecified: Secondary | ICD-10-CM | POA: Diagnosis not present

## 2022-08-08 DIAGNOSIS — Z86010 Personal history of colonic polyps: Secondary | ICD-10-CM | POA: Diagnosis not present

## 2022-08-08 DIAGNOSIS — R627 Adult failure to thrive: Secondary | ICD-10-CM | POA: Diagnosis not present

## 2022-08-08 DIAGNOSIS — Z85038 Personal history of other malignant neoplasm of large intestine: Secondary | ICD-10-CM | POA: Diagnosis not present

## 2022-08-08 DIAGNOSIS — R195 Other fecal abnormalities: Secondary | ICD-10-CM | POA: Diagnosis not present

## 2022-08-08 LAB — RENAL FUNCTION PANEL
Albumin: 2.4 g/dL — ABNORMAL LOW (ref 3.5–5.0)
Anion gap: 13 (ref 5–15)
BUN: 57 mg/dL — ABNORMAL HIGH (ref 8–23)
CO2: 25 mmol/L (ref 22–32)
Calcium: 8.3 mg/dL — ABNORMAL LOW (ref 8.9–10.3)
Chloride: 97 mmol/L — ABNORMAL LOW (ref 98–111)
Creatinine, Ser: 6.94 mg/dL — ABNORMAL HIGH (ref 0.44–1.00)
GFR, Estimated: 6 mL/min — ABNORMAL LOW (ref >60)
Glucose, Bld: 127 mg/dL — ABNORMAL HIGH (ref 70–99)
Phosphorus: 4.8 mg/dL — ABNORMAL HIGH (ref 2.5–4.6)
Potassium: 3.6 mmol/L (ref 3.5–5.1)
Sodium: 135 mmol/L (ref 135–145)

## 2022-08-08 LAB — HEPATITIS B SURFACE ANTIBODY, QUANTITATIVE: Hep B S AB Quant (Post): >1000 m[IU]/mL (ref >9.9)

## 2022-08-08 LAB — GLUCOSE, CAPILLARY
Glucose-Capillary: 121 mg/dL — ABNORMAL HIGH (ref 70–99)
Glucose-Capillary: 136 mg/dL — ABNORMAL HIGH (ref 70–99)
Glucose-Capillary: 102 mg/dL — ABNORMAL HIGH (ref 70–99)

## 2022-08-08 LAB — CBC
HCT: 24.2 % — ABNORMAL LOW (ref 36.0–46.0)
Hemoglobin: 7.9 g/dL — ABNORMAL LOW (ref 12.0–15.0)
MCH: 35.6 pg — ABNORMAL HIGH (ref 26.0–34.0)
MCHC: 32.6 g/dL (ref 30.0–36.0)
MCV: 109 fL — ABNORMAL HIGH (ref 80.0–100.0)
Platelets: 264 10*3/uL (ref 150–400)
RBC: 2.22 MIL/uL — ABNORMAL LOW (ref 3.87–5.11)
RDW: 22.4 % — ABNORMAL HIGH (ref 11.5–15.5)
WBC: 7.3 10*3/uL (ref 4.0–10.5)
nRBC: 0 % (ref 0.0–0.2)

## 2022-08-08 MED ORDER — BISACODYL 5 MG PO TBEC
20.0000 mg | DELAYED_RELEASE_TABLET | Freq: Once | ORAL | Status: AC
  Administered 2022-08-08: 20 mg via ORAL
  Filled 2022-08-08: qty 4

## 2022-08-08 MED ORDER — OMEPRAZOLE 20 MG PO CPDR
20.0000 mg | DELAYED_RELEASE_CAPSULE | Freq: Two times a day (BID) | ORAL | Status: DC
  Administered 2022-08-08 – 2022-08-17 (×14): 20 mg via ORAL
  Filled 2022-08-08 (×21): qty 1

## 2022-08-08 MED ORDER — PEG-KCL-NACL-NASULF-NA ASC-C 100 G PO SOLR
0.5000 | Freq: Once | ORAL | Status: AC
  Administered 2022-08-08: 100 g via ORAL
  Filled 2022-08-08: qty 1

## 2022-08-08 MED ORDER — METOCLOPRAMIDE HCL 5 MG/ML IJ SOLN
10.0000 mg | Freq: Four times a day (QID) | INTRAMUSCULAR | Status: AC
  Administered 2022-08-08 (×2): 10 mg via INTRAVENOUS
  Filled 2022-08-08 (×2): qty 2

## 2022-08-08 MED ORDER — PEG-KCL-NACL-NASULF-NA ASC-C 100 G PO SOLR
0.5000 | Freq: Once | ORAL | Status: AC
  Administered 2022-08-08: 100 g via ORAL

## 2022-08-08 MED ORDER — PEG-KCL-NACL-NASULF-NA ASC-C 100 G PO SOLR: 1.0000 | Freq: Once | ORAL | Status: DC

## 2022-08-08 NOTE — Progress Notes (Signed)
Reeducated pt on the importance to regularly drink Moviprep for colonoscopy procedure in the morning. States " I want to wait for my husband". Then sated "ok, i'll try". Will continue to encourage and monitor.

## 2022-08-08 NOTE — Procedures (Addendum)
HD Note:  Some information was entered later than the data was gathered due to patient care needs. The stated time with the data is accurate.  Received patient in bed to unit.  Alert and oriented to self.  Very pleasant. Informed consent signed and in chart.    Patient tolerated well.  Transported back to the room  Alert, without acute distress.  Hand-off given to patient's nurse.   Access used: Left upper chest HD catheter Access issues: None  Total UF removed: 3000  The Post Treatment assessment was completed in the flow sheet under the incorrect time.  The actual time of the assessment was 1250.  The information was filed under the [facility]

## 2022-08-08 NOTE — Consult Note (Signed)
[facility] Consult: 10:43 AM 08/08/2022  LOS: 2 days    Referring Provider: Dr Tyrell Antonio.    Primary Care Physician:  [facility] Primary Gastroenterologist:  Dr. Hurshel Keys in [place].       Reason for Consultation:  Anemia.  FOBT +.     HPI: Shawna [name] is a 82 y.o. female.  Hx memory impairment.  ESRD, HD MWF.  Atrial fibrillation, no longer on DAPT.  CAD, 2013 CABG, 2018 stents.  HFpEF, LVEF30 to 35%/grade 2 DD on echo 07/2021.  Carotid artery disease.  TME due to malignant otitis externa 04/2022.  ? TSH mediated hyperthyroidism vs effects of amiodarone.  Anxiety.  Multiple medication allergies including Dexilant, pantoprazole, Venofer.  Cane/walker dependent at baseline. Colon and ovarian cancer.  Colon resection 1992.  Anemia requiring transfusions dating to 2013., heme positive stools.  TA colon polyps in 2013. 07/2016 EGD for anemia, prior duodenal AVMs, recent NSTEMI and pending cardiac cath.  2 cm hiatal hernia.  Normal stomach and esophagus.  Solitary duodenal versus jejunal polyp was not removed.  Nonbleeding jejunal AVM treated with APC and may have been source for anemia.. 02/2019 colonoscopy.  For evaluation gastrointestinal bleeding.  Pandiverticulosis.  Patent ileocolonic anastomosis.  Removed 2 small polyps (TAs) from ascending colon.  Piecemeal resection using a lift technique of large polyp (TA) in transverse colon. 09/2019 EGD for evaluation duodenal polyp.  Esophagus, Z-line normal.  3 cm hiatal hernia.  Normal stomach and duodenal bulb.  Solitary, diminutive, sessile polyp at D2 was biopsied (adenoma wo HGD).  Exam completed to third duodenum. 02/05/2021 EGD for melena, blood loss anemia.  Showed mild severity esophageal candidiasis without bleeding.  Gastritis, nonbleeding.   Normal examined duodenum to D2.  Treated with Diflucan for 14 days and continue daily oral PPI. 04/22/2021 VCE.  Successful, complete study.  Small, flat polyp in either second or third portion duodenum with no stigmata of bleeding.  2 polyps at distal small bowel, 1 small size the other medium.  No bleeding stigmata in either of these polyps.  Dr Laural Golden rec push enteroscopy.   06/08/2021 colonoscopy, enteroscopy were cxld due to IV infiltration RUE.  LUE not amenable to IV as her fistula is in that arm..  Plans for outpt PICC line and proceed with GI procedures in January 2022, but procedures never happened. Meds include low-dose aspirin.  Prilosec 20 mg/day, Zofran prn.  Folic acid. Mircera, last dose 1/22.    Latest transfusions were in December 2023 when she had AV fistula/graft associated bleeding. 2 PRBCs for Hgb 5.8.  was 9.3 within 1 week Presented to hospital with fatigue, confusion.  She had missed dialysis on Friday.  Painless leg swelling.  Hgb 6.7.Marland Kitchen 1 PRBC..  9.. 7.9.   MCV 109 (115 in Dec 2023) FOBT + but was negative x 4 in 2023.  Did not see she has had recent DRE. Iron, iron sats, folate, B12 normal.  Ferritin 912.  TIBC low at 234. Low albumin 2.4 but no elevation T. bili, alk phos, transaminases. CT  head with nothing acute, positive for atrophy, small vessel ischemia.  Patient has memory impairment and is an unreliable historian.  Endorses periodic mid abdominal pain, not currently present.  Denies recent constipation, diarrhea, bloody stools, nausea, vomiting, dysphagia.  Endorses occasional falling at home but gets around with the use of a walker/cane.  Lives with her husband.  No alcohol, tobacco products or recreational drug use. Family history in parents and siblings include heart disease, HLD, hypertension, MI, diabetes.  One of her children who is deceased had colon cancer at 83 years old.   Past Medical History:  Diagnosis Date   Acute on chronic respiratory failure  with hypoxia ([place]) 10/10/2016   Anxiety    Arthritis    AVM (arteriovenous malformation) of colon    CAD (coronary artery disease)    a. s/p CABG in 2013 b. DES to D1 in 10/2016. c. cath in 07/2018 showing patent grafts with occlusion of D1 at prior stent site and progression of PDA disease --> medical management recommended   Carotid artery disease ([place])    a. 89-21% LICA, 07/9415    Chronic bronchitis (HCC)    Chronic HFrEF (heart failure with reduced ejection fraction) ([place])    Colon cancer ([place]) 1992   Esophageal stricture    ESRD on hemodialysis ([place])    ESRD due to HTN, started dialysis 2011 and gets HD at [facility] with Dr Hinda Lenis on MWF schedule.  Access is LUA AVF as of Sept 2014.    GERD (gastroesophageal reflux disease)    High cholesterol 12/2011   History of blood transfusion 07/2011; 12/2011; 01/2012 X 2; 04/2012   History of gout    History of lower GI bleeding    Hypertension    Iron deficiency anemia    Jugular vein occlusion, right (HCC)    Mitral regurgitation    a. Moderate by echo, 02/2012   Mitral valve disease    NSVT (nonsustained ventricular tachycardia) ([place])    Ovarian cancer ([place]) 1992   PAF (paroxysmal atrial fibrillation) ([place])    Pneumonia ~ 2009   PUD (peptic ulcer disease)    TIA (transient ischemic attack)    Tricuspid valve disease     Past Surgical History:  Procedure Laterality Date   A/V SHUNTOGRAM Left 03/19/2019   Procedure: A/V SHUNTOGRAM;  Surgeon: Katha Cabal, MD;  Location: [place] [facility];  Service: Cardiovascular;  Laterality: Left;   ABDOMINAL HYSTERECTOMY  1992   APPENDECTOMY  06/1990   AV FISTULA PLACEMENT  07/2009   left upper arm   AV FISTULA PLACEMENT Right 09/06/2016   Procedure: RIGHT FOREARM ARTERIOVENOUS (AV) GRAFT;  Surgeon: Elam Dutch, MD;  Location: [place];  Service: Vascular;  Laterality: Right;   AV FISTULA PLACEMENT N/A 02/24/2017   Procedure: INSERTION OF ARTERIOVENOUS (AV) GORE-TEX GRAFT ARM  (BRACHIAL AXILLARY);  Surgeon: Katha Cabal, MD;  Location: [facility];  Service: Vascular;  Laterality: N/A;   [place] Right 09/06/2016   Procedure: REMOVAL OF Right Arm ARTERIOVENOUS GORETEX GRAFT and Vein Patch angioplasty of brachial artery;  Surgeon: Angelia Mould, MD;  Location: [place];  Service: Vascular;  Laterality: Right;   BIOPSY  09/26/2019   Procedure: BIOPSY;  Surgeon: Rogene Houston, MD;  [facility];  Service: Endoscopy;;   COLON RESECTION  1992   COLON SURGERY     COLONOSCOPY N/A 03/09/2019   Procedure: COLONOSCOPY;  Surgeon: Rogene Houston, MD;  [facility];  Service: Endoscopy;  Laterality: N/A;   COLONOSCOPY WITH PROPOFOL N/A 06/08/2021   Procedure: COLONOSCOPY WITH PROPOFOL;  Surgeon: Harvel Quale, MD;  [facility];  Service: Gastroenterology;  Laterality: N/A;  9:05 /Patient is on dialysis Mon Wed Fri   CORONARY ANGIOPLASTY WITH STENT PLACEMENT  12/15/11   "2"   CORONARY ANGIOPLASTY WITH STENT PLACEMENT  y/2013   "1; makes total of 3" (05/02/2012)   CORONARY ARTERY BYPASS GRAFT  06/13/2012   Procedure: CORONARY ARTERY BYPASS GRAFTING (CABG);  Surgeon: Grace Isaac, MD;  Location: [place];  Service: Open Heart Surgery;  Laterality: N/A;  cabg x four;  using left internal mammary artery, and left leg greater saphenous vein harvested endoscopically   CORONARY STENT INTERVENTION N/A 10/13/2016   Procedure: Coronary Stent Intervention;  Surgeon: Troy Sine, MD;  Location: [place] [facility];  Service: Cardiovascular;  Laterality: N/A;   DIALYSIS/PERMA CATHETER REMOVAL N/A 04/18/2017   Procedure: DIALYSIS/PERMA CATHETER REMOVAL;  Surgeon: Katha Cabal, MD;  Location: [place] [facility];  Service: Cardiovascular;  Laterality: N/A;   DILATION AND CURETTAGE OF UTERUS     ENTEROSCOPY N/A 06/08/2021   Procedure: PUSH ENTEROSCOPY;  Surgeon: Harvel Quale, MD;  [facility];  Service:  Gastroenterology;  Laterality: N/A;   ESOPHAGOGASTRODUODENOSCOPY  01/20/2012   Procedure: ESOPHAGOGASTRODUODENOSCOPY (EGD);  Surgeon: Ladene Artist, MD,FACG;  Location: [facility] ENDOSCOPY;  Service: Endoscopy;  Laterality: N/A;   ESOPHAGOGASTRODUODENOSCOPY N/A 03/26/2013   Procedure: ESOPHAGOGASTRODUODENOSCOPY (EGD);  Surgeon: Irene Shipper, MD;  Location[facility];  Service: Endoscopy;  Laterality: N/A;   ESOPHAGOGASTRODUODENOSCOPY N/A 04/30/2015   Procedure: ESOPHAGOGASTRODUODENOSCOPY (EGD);  Surgeon: Rogene Houston, MD;  [facility];  Service: Endoscopy;  Laterality: N/A;  1pm - moved to 10/20 @ 1:10   ESOPHAGOGASTRODUODENOSCOPY N/A 07/29/2016   Procedure: ESOPHAGOGASTRODUODENOSCOPY (EGD);  Surgeon: Manus Gunning, MD;  Location: [place];  Service: Gastroenterology;  Laterality: N/A;  enteroscopy   ESOPHAGOGASTRODUODENOSCOPY N/A 09/26/2019   Procedure: ESOPHAGOGASTRODUODENOSCOPY (EGD);  Surgeon: Rogene Houston, MD;  [facility];  Service: Endoscopy;  Laterality: N/A;  1250   ESOPHAGOGASTRODUODENOSCOPY (EGD) WITH PROPOFOL N/A 02/05/2021   Procedure: ESOPHAGOGASTRODUODENOSCOPY (EGD) WITH PROPOFOL;  Surgeon: Eloise Harman, DO;  [facility];  Service: Endoscopy;  Laterality: N/A;   GIVENS CAPSULE STUDY N/A 03/07/2019   Procedure: GIVENS CAPSULE STUDY;  Surgeon: Rogene Houston, MD;  [facility];  Service: Endoscopy;  Laterality: N/A;  7:30   GIVENS CAPSULE STUDY N/A 04/22/2021   Procedure: GIVENS CAPSULE STUDY;  Surgeon: Rogene Houston, MD;  [facility];  Service: Endoscopy;  Laterality: N/A;  7:30   INSERTION OF DIALYSIS CATHETER N/A 10/05/2020   Procedure: ABORTED TUNNELED DIALYSIS CATHETER PLACEMENT RIGHT INTERNAL JUGULAR VEIN ;  Surgeon: Virl Cagey, MD;  [facility];  Service: General;  Laterality: N/A;   INTRAOPERATIVE TRANSESOPHAGEAL ECHOCARDIOGRAM  06/13/2012   Procedure: INTRAOPERATIVE TRANSESOPHAGEAL  ECHOCARDIOGRAM;  Surgeon: Grace Isaac, MD;  Location: [place];  Service: Open Heart Surgery;  Laterality: N/A;   IR DIALY SHUNT INTRO NEEDLE/INTRACATH INITIAL W/IMG LEFT Left 10/06/2020   IR FLUORO GUIDE CV LINE RIGHT  06/17/2020   IR GENERIC HISTORICAL  07/26/2016   IR FLUORO GUIDE CV LINE RIGHT 07/26/2016 Greggory Keen, MD [facility]   IR GENERIC HISTORICAL  07/26/2016   IR US GUIDE VASC ACCESS RIGHT 07/26/2016 Greggory Keen, MD [facility]  IR GENERIC HISTORICAL  08/02/2016   IR US GUIDE VASC ACCESS RIGHT 08/02/2016 Greggory Keen, MD [facility]   IR GENERIC HISTORICAL  08/02/2016   IR FLUORO GUIDE CV LINE RIGHT 08/02/2016 Greggory Keen, MD [facility]   IR RADIOLOGY PERIPHERAL GUIDED IV START  03/28/2017   IR REMOVAL TUN CV CATH W/O FL  08/11/2020   IR THROMBECTOMY AV FISTULA W/THROMBOLYSIS INC/SHUNT/IMG LEFT Left 06/17/2020   IR US GUIDE VASC ACCESS LEFT  06/17/2020   IR US GUIDE VASC ACCESS RIGHT  03/28/2017   IR US GUIDE VASC ACCESS RIGHT  06/17/2020   LEFT HEART CATH AND CORONARY ANGIOGRAPHY N/A 09/20/2016   Procedure: Left Heart Cath and Coronary Angiography;  Surgeon: [name], MD;  Location: [place] [facility];  Service: Cardiovascular;  Laterality: N/A;   LEFT HEART CATH AND CORS/GRAFTS ANGIOGRAPHY N/A 10/13/2016   Procedure: Left Heart Cath and Cors/Grafts Angiography;  Surgeon: Troy Sine, MD;  Location: [place] [facility];  Service: Cardiovascular;  Laterality: N/A;   LEFT HEART CATH AND CORS/GRAFTS ANGIOGRAPHY N/A 07/13/2018   Procedure: LEFT HEART CATH AND CORS/GRAFTS ANGIOGRAPHY;  Surgeon: Martinique, Peter M, MD;  Location: [place] [facility];  Service: Cardiovascular;  Laterality: N/A;   LEFT HEART CATH AND CORS/GRAFTS ANGIOGRAPHY N/A 07/22/2021   Procedure: LEFT HEART CATH AND CORS/GRAFTS ANGIOGRAPHY;  Surgeon: Lorretta Harp, MD;  Location: [place] [facility];  Service: Cardiovascular;  Laterality: N/A;   LEFT HEART CATHETERIZATION WITH CORONARY ANGIOGRAM N/A 12/15/2011    Procedure: LEFT HEART CATHETERIZATION WITH CORONARY ANGIOGRAM;  Surgeon: Burnell Blanks, MD;  Location: [facility] CATH LAB;  Service: Cardiovascular;  Laterality: N/A;   LEFT HEART CATHETERIZATION WITH CORONARY ANGIOGRAM N/A 01/10/2012   Procedure: LEFT HEART CATHETERIZATION WITH CORONARY ANGIOGRAM;  Surgeon: Peter M Martinique, MD;  Location: [facility] CATH LAB;  Service: Cardiovascular;  Laterality: N/A;   LEFT HEART CATHETERIZATION WITH CORONARY ANGIOGRAM N/A 06/08/2012   Procedure: LEFT HEART CATHETERIZATION WITH CORONARY ANGIOGRAM;  Surgeon: Burnell Blanks, MD;  Location: [facility] CATH LAB;  Service: Cardiovascular;  Laterality: N/A;   LEFT HEART CATHETERIZATION WITH CORONARY/GRAFT ANGIOGRAM N/A 12/10/2013   Procedure: LEFT HEART CATHETERIZATION WITH Beatrix Fetters;  Surgeon: [name], MD;  Location: [facility] CATH LAB;  Service: Cardiovascular;  Laterality: N/A;   OVARY SURGERY     ovarian cancer   POLYPECTOMY  03/09/2019   Procedure: POLYPECTOMY;  Surgeon: Rogene Houston, MD;  [facility];  Service: Endoscopy;;  cecal    POLYPECTOMY N/A 09/26/2019   Procedure: DUODENAL POLYPECTOMY;  Surgeon: Rogene Houston, MD;  [facility];  Service: Endoscopy;  Laterality: N/A;   REVISION OF ARTERIOVENOUS GORETEX GRAFT N/A 02/24/2017   Procedure: REVISION OF ARTERIOVENOUS GORETEX GRAFT (RESECTION);  Surgeon: Katha Cabal, MD;  Location: [facility];  Service: Vascular;  Laterality: N/A;   REVISON OF ARTERIOVENOUS FISTULA Left 06/19/2020   Procedure: REVISION OF LEFT UPPER ARM AV GRAFT WITH INTERPOSITION JUMP GRAFT USING 6MM GORE LIMB;  Surgeon: Marty Heck, MD;  Location: [place];  Service: Vascular;  Laterality: Left;   SHUNTOGRAM N/A 10/15/2013   Procedure: Fistulogram;  Surgeon: Serafina Mitchell, MD;  Location: [facility] CATH LAB;  Service: Cardiovascular;  Laterality: N/A;   THROMBECTOMY / ARTERIOVENOUS GRAFT REVISION  2011   left upper arm   TUBAL LIGATION  1980's    UPPER EXTREMITY ANGIOGRAPHY Bilateral 12/06/2016   Procedure: Upper Extremity Angiography;  Surgeon: Katha Cabal, MD;  Location: [place] [facility];  Service: Cardiovascular;  Laterality: Bilateral;   UPPER EXTREMITY INTERVENTION Left 06/06/2017   Procedure: UPPER EXTREMITY INTERVENTION;  Surgeon: Katha Cabal, MD;  Location: [place] [facility];  Service: Cardiovascular;  Laterality: Left;    Prior to Admission medications   Medication Sig Start Date End Date Taking? Authorizing Provider  ALPRAZolam Duanne Moron) 0.5 MG tablet Take 0.5 mg by mouth daily as needed for anxiety. 07/26/22  Yes [provider]  amiodarone (PACERONE) 100 MG tablet Take 1 tablet (100 mg total) by mouth daily. 04/27/22  Yes Nita Sells, MD  Aspirin 81 MG CAPS Take 1 capsule by mouth daily at 12 noon.   Yes [provider]  benzonatate (TESSALON) 100 MG capsule Take 100 mg by mouth 3 (three) times daily as needed for cough. 05/10/22  Yes [provider]  cyclobenzaprine (FLEXERIL) 5 MG tablet Take 5 mg by mouth at bedtime. 12/27/21  Yes [provider]  folic acid (FOLVITE) 1 MG tablet Take 1 mg by mouth daily. 05/24/22  Yes [provider]  hydrOXYzine (ATARAX) 25 MG tablet Take 25 mg by mouth every 6 (six) hours as needed for itching. 06/06/22  Yes [provider]  levothyroxine (SYNTHROID) 50 MCG tablet Take 1 tablet (50 mcg total) by mouth daily before breakfast. 06/12/22  Yes Aline August, MD  metoprolol tartrate (LOPRESSOR) 25 MG tablet Take 0.5 tablets (12.5 mg total) by mouth 2 (two) times daily. 03/04/22  Yes BranchAlphonse Guild, MD  multivitamin (RENA-VIT) TABS tablet Take 1 tablet by mouth daily.   Yes [provider]  nitroGLYCERIN (NITROSTAT) 0.4 MG SL tablet Place 0.4 mg under the tongue every 5 (five) minutes as needed for chest pain.   Yes [provider]  omeprazole (PRILOSEC) 20 MG capsule Take 2 capsules (40 mg total)  by mouth daily as needed (acid reflux). Patient taking differently: Take 20 mg by mouth daily. 06/12/22  Yes Aline August, MD  ondansetron (ZOFRAN-ODT) 4 MG disintegrating tablet Take 4 mg by mouth every 8 (eight) hours as needed for nausea or vomiting. 05/05/22  Yes [provider]  rosuvastatin (CRESTOR) 10 MG tablet Take 1 tablet by mouth once daily 06/06/22  Yes Strader, [place], PA-C  sevelamer carbonate (RENVELA) 800 MG tablet Take 3,200 mg by mouth in the morning and at bedtime. 12/27/21  Yes [provider]    Scheduled Meds:  (feeding supplement) PROSource Plus  30 mL Oral BID BM   sodium chloride   Intravenous Once   acetaminophen  500 mg Oral BID   amiodarone  100 mg Oral Daily   aspirin EC  81 mg Oral Daily   Chlorhexidine Gluconate Cloth  6 each Topical Q0600   diclofenac Sodium  2 g Topical BID   famotidine  20 mg Oral Daily   feeding supplement (NEPRO CARB STEADY)  237 mL Oral Z61W   folic acid  1 mg Oral Daily   heparin  5,000 Units Subcutaneous Q8H   insulin aspart  0-6 Units Subcutaneous TID WC   levothyroxine  50 mcg Oral QAC breakfast   metoprolol tartrate  12.5 mg Oral BID   multivitamin  1 tablet Oral Daily   rosuvastatin  10 mg Oral Daily   sevelamer carbonate  2,400 mg Oral TID with meals   sodium chloride flush  3 mL Intravenous Q12H   Infusions:  anticoagulant sodium citrate     PRN Meds: alteplase, anticoagulant sodium citrate, calcium  carbonate (dosed in mg elemental calcium), camphor-menthol **AND** hydrOXYzine, docusate sodium, fentaNYL (SUBLIMAZE) injection, heparin, naLOXone (NARCAN)  injection, ondansetron **OR** ondansetron (ZOFRAN) IV, mouth rinse, sorbitol   Allergies as of 08/06/2022 - Review Complete 08/06/2022  Allergen Reaction Noted   Amlodipine Swelling 01/19/2015   Aspirin Other (See Comments) 12/15/2011   Nitrofurantoin Hives 07/08/2009   Bactrim [sulfamethoxazole-trimethoprim] Rash 12/15/2011   Contrast media  [iodinated contrast media] Itching 12/15/2011   Iron Itching and Other (See Comments) 05/02/2012   Tylenol [acetaminophen] Itching and Other (See Comments) 07/08/2016   Gabapentin Other (See Comments) 02/08/2017   Iron sucrose Other (See Comments) 04/15/2021   Ranexa [ranolazine] Other (See Comments) 08/30/2020   Sucroferric oxyhydroxide Other (See Comments) 04/15/2021   Dexilant [dexlansoprazole] Other (See Comments) 05/29/2013   Hydralazine Itching 12/14/2018   Levaquin [levofloxacin in d5w] Rash 10/04/2013   Morphine and related Itching and Other (See Comments) 09/16/2014   Pantoprazole Rash 03/21/2013   Plavix [clopidogrel bisulfate] Rash 01/05/2012   Protonix [pantoprazole sodium] Rash 03/21/2013   Venofer [ferric oxide] Itching and Other (See Comments) 05/02/2012    Family History  Problem Relation Age of Onset   Heart disease Mother        Heart Disease before age 74   Hyperlipidemia Mother    Hypertension Mother    Diabetes Mother    Heart attack Mother    Heart disease Father        Heart Disease before age 64   Hyperlipidemia Father    Hypertension Father    Diabetes Father    Diabetes Sister    Hypertension Sister    Diabetes Brother    Hyperlipidemia Brother    Heart attack Brother    Hypertension Sister    Heart attack Brother    Colon cancer Child 76   Other Other        noncontributory for early CAD   Esophageal cancer Neg Hx    Liver disease Neg Hx    Kidney disease Neg Hx    Colon polyps Neg Hx     Social History   Socioeconomic History   Marital status: Married    Spouse name: Idona Stach   Number of children: 1   Years of education: Not on file   Highest education level: GED or equivalent  Occupational History   Occupation: retired- Technical sales engineer- [employer]  Tobacco Use   Smoking status: Never   Smokeless tobacco: Never  Vaping Use   Vaping Use: Never used  Substance and Sexual Activity   Alcohol use: No    Alcohol/week:  0.0 standard drinks of alcohol   Drug use: No   Sexual activity: Not Currently    Birth control/protection: Surgical  Other Topics Concern   Not on file  Social History Narrative   Lives in [place], [place] with husband.  Dialysis pt - mwf.   Social Determinants of Health   Financial Resource Strain: Low Risk  (12/14/2018)   Overall Financial Resource Strain (CARDIA)    Difficulty of Paying Living Expenses: Not very hard  Food Insecurity: No Food Insecurity (08/06/2022)   Hunger Vital Sign    Worried About Running Out of Food in the Last Year: Never true    Ran Out of Food in the Last Year: Never true  Transportation Needs: No Transportation Needs (08/06/2022)   PRAPARE - Hydrologist (Medical): No    Lack of Transportation (Non-Medical): No  Physical Activity: Unknown (12/14/2018)  Exercise Vital Sign    Days of Exercise per Week: Patient refused    Minutes of Exercise per Session: Patient refused  Stress: No Stress Concern Present (12/14/2018)   [place]    Feeling of Stress : Only a little  Social Connections: Unknown (12/14/2018)   Social Connection and Isolation Panel [NHANES]    Frequency of Communication with Friends and Family: Patient refused    Frequency of Social Gatherings with Friends and Family: Patient refused    Attends Religious Services: Patient refused    Active Member of Clubs or Organizations: Patient refused    Attends Archivist Meetings: Patient refused    Marital Status: Patient refused  Intimate Partner Violence: Not At Risk (08/06/2022)   Humiliation, Afraid, Rape, and Kick questionnaire    Fear of Current or Ex-Partner: No    Emotionally Abused: No    Physically Abused: No    Sexually Abused: No    REVIEW OF SYSTEMS: Constitutional: Patient herself denies weakness and fatigue. ENT:  No nose bleeds Pulm: No shortness of breath or pleuritic pain. CV:  No  palpitations, no LE edema.  Denies angina. GU:  No hematuria, no frequency GI: See HPI. Heme: Denies excessive or unusual bleeding/bruising. Transfusions: On multiple occasions has received PRBCs some of this noted in HPI. Neuro:  No headaches, no peripheral tingling or numbness Derm:  No itching, no rash or sores.  Endocrine:  No sweats or chills.  No polyuria or dysuria Immunization: Reviewed. Travel:  None beyond local counties in last few months.    PHYSICAL EXAM: Vital signs in last 24 hours: Vitals:   08/08/22 1000 08/08/22 1030  BP: (!) 170/60 (!) 172/75  Pulse: 64 68  Resp: 17 17  Temp:    SpO2:  97%   Wt Readings from Last 3 Encounters:  08/08/22 65.5 kg  08/04/22 77.6 kg  07/29/22 59 kg    General: Somewhat frail, pleasantly confused, alert and verbal. Head: No facial asymmetry or swelling Eyes: No scleral icterus or conjunctival pallor. Ears: Not hard of hearing Nose: No discharge or congestion Mouth: Good dentition with some missing molars.  Mucosa moist, pink, clear.  Tongue midline. Neck: No thyromegaly, no JVD.  Dialysis catheter at base of left neck Lungs: Clear bilaterally without labored breathing. Heart: Currently in NSR rate in the 70s. Abdomen: Soft without tenderness.  Active bowel sounds.  No distention.  No HSM, masses, bruits, hernias..   Rectal: Furred rectal exam, she was interviewed/examined in dialysis unit. Musc/Skeltl: No CCE. Extremities: AV graft/fistula without thrill and nonfunctional in left upper arm. Neurologic: Oriented to self but not to place, time.  Often answers yes to questions.  No expressive aphasia or paucity of speech. Skin: No suspicious lesions, rashes or sores. Nodes: No cervical adenopathy Psych: Calm, pleasant, cooperative.  Intake/Output from previous day: 01/28 0701 - 01/29 0700 In: 363 [P.O.:360; I.V.:3] Out: 0  Intake/Output this shift: No intake/output data recorded.  LAB RESULTS: Recent Labs     08/06/22 0139 08/07/22 1148 08/08/22 0859  WBC 8.6 8.9 7.3  HGB 6.7* 9.0* 7.9*  HCT 21.1* 27.6* 24.2*  PLT 309 289 264   BMET Lab Results  Component Value Date   NA 135 08/08/2022   NA 133 (L) 08/07/2022   NA 138 08/06/2022   K 3.6 08/08/2022   K 3.7 08/07/2022   K 4.1 08/06/2022   CL 97 (L) 08/08/2022   CL 96 (  L) 08/07/2022   CL 99 08/06/2022   CO2 25 08/08/2022   CO2 22 08/07/2022   CO2 25 08/06/2022   GLUCOSE 127 (H) 08/08/2022   GLUCOSE 122 (H) 08/07/2022   GLUCOSE 113 (H) 08/06/2022   BUN 57 (H) 08/08/2022   BUN 36 (H) 08/07/2022   BUN 46 (H) 08/06/2022   CREATININE 6.94 (H) 08/08/2022   CREATININE 5.33 (H) 08/07/2022   CREATININE 7.30 (H) 08/06/2022   CALCIUM 8.3 (L) 08/08/2022   CALCIUM 9.0 08/07/2022   CALCIUM 9.2 08/06/2022   LFT Recent Labs    08/06/22 0139 08/08/22 0911  PROT 6.5  --   ALBUMIN 3.0* 2.4*  AST 11*  --   ALT 16  --   ALKPHOS 99  --   BILITOT 0.5  --    PT/INR Lab Results  Component Value Date   INR 1.3 (H) 11/08/2021   INR 1.0 02/03/2021   INR 1.1 06/12/2020   Hepatitis Panel Recent Labs    08/06/22 1534  HEPBSAG NON REACTIVE   C-Diff No components found for: "CDIFF" Lipase     Component Value Date/Time   LIPASE 43 07/06/2021 1647    Drugs of Abuse  No results found for: "LABOPIA", "COCAINSCRNUR", "LABBENZ", "AMPHETMU", "THCU", "LABBARB"   RADIOLOGY STUDIES: No results found.    IMPRESSION:   Recurrent acute on chronic anemia with FOBT positive stools.  Multiple upper endoscopies, colonoscopy and at least 1 VCE over the last several years for investigation of blood loss associated anemia.  Findings included adenomatous colon polyps, duodenal adenoma, Esophageal candidiasis, gastritis, small hiatal hernia, pandiverticulosis, Healthy ileocolonic anastomosis  Colon cancer, partial colectomy w ileo colonic anastomosis 1992.  Recurrence of adenomatous colon polyps in 2013 and at latest EGD in 02/2019.     ESRD     A fib.  History of CABG and cardiac stents.  She is on 81 aspirin but no longer takes Plavix.  Confusion, recent TME 06/2022.  By exam today she is pleasantly confused but appropriate.  19 listed allergies including Plavix which caused rash, Venna fair which caused itching, pantoprazole caused rash, Dexilant caused upset stomach.  Currently takes omeprazole with no apparent adverse reactions.  PLAN:     Per Dr. Hilarie Fredrickson.  Add Omeprazole 20 mg bid, pharmcy will order.  Stop pepcid.  ? Pursue enteroscopy and or colonoscopy?    Azucena Freed  08/08/2022, 10:43 AM Phone [phone]

## 2022-08-08 NOTE — Plan of Care (Signed)
  Problem: Education: Goal: Ability to describe self-care measures that may prevent or decrease complications (Diabetes Survival Skills Education) will improve Outcome: Progressing Goal: Individualized Educational Video(s) Outcome: Progressing   Problem: Coping: Goal: Ability to adjust to condition or change in health will improve Outcome: Progressing   Problem: Health Behavior/Discharge Planning: Goal: Ability to identify and utilize available resources and services will improve Outcome: Progressing Goal: Ability to manage health-related needs will improve Outcome: Progressing

## 2022-08-08 NOTE — Progress Notes (Signed)
PROGRESS NOTE    Shawna [name]  EBX:[id number] DOB: 1940-12-18 DOA: 08/06/2022 PCP: [facility]   Brief Narrative: 82 year old with past medical history significant for ESRD on hemodialysis MWF, CAD status post CABG, chronic systolic heart failure, hyperlipidemia, hypertension, A-fib presented with generalized weakness.  Her husband reports patient been complaining of severe headache around the ear and down the neck, difficulty turning her neck, difficulty walking.  Symptoms started about a week ago.  She had a couple of shots in her neck on 1/19 and she was given a pill for it on 1/25.  It is eased some  but is still.  Patient missed HD.  Patient was last admitted 12/1-3 for anemia associated with ABLA from her fistula, not on AC despite A-fib.  She returned 12/8 with cough treated with antibiotics with in the terminated chest x-ray.  She returned 1/19 with right-sided headache, diagnosed with occipital neuralgia and treated with nerve block.  Return 1 8/25 with neck pain attributed to the same.     Assessment & Plan:   Principal Problem:   Failure to thrive in adult Active Problems:   HLD (hyperlipidemia)   Hx of CABG   ESRD (end stage renal disease) on dialysis (HCC)   Anemia associated with chronic renal failure   Paroxysmal Atrial fibrillation/Flutter   1-FTT: -Patient with h/o ESRD, she has missed a couple of recent sessions due to progressive weakness, difficulty ambulating. -She has been seen in the ER twice recently (1/19 and 1/25) for occipital neuralgia -Anemia, received blood transfusion.  -Palliative care consulted for goals of care, full scope of treatment -PT OT eval, needs SNF  ESRD on HD: Hemodialysis MWF Nephrology consulted Continue Rena-Vite and  Sevelamer  Anemia: Presents with hb 6 Anemia of CKD 1 unit of packed red blood cell order Hb increase to 9/ post transfusion----7.9 Monitor for melena.  Occult blood positive.  GI consulted,  planning enteroscopy ?  Start PPI.   CAD:  Status post CABG Holding metoprolol due to soft low blood pressure Continue with aspirin  HTN:  Holding metoprolol due to soft blood pressure  A fib;  Continue with amiodarone Not a candidate for [facility]  Hypothyroidism:  Continue with Synthroid  DM;  A1c: Continue with a sliding scale insulin  Headaches, Occipital neuralgia. Received nerve block Improved on schedule tylenol,  voltaren gel. , heating patch.     Estimated body mass index is 26.41 kg/m as calculated from the following:   Height as of this encounter: '5\' 2"'$  (1.575 m).   Weight as of this encounter: 65.5 kg.   DVT prophylaxis: Heparin  Code Status: Full code Family Communication: care discussed with daughter 1/28 Disposition Plan:  Status is: Inpatient Remains inpatient appropriate because: management of AMS. Anemia     Consultants:  Nephrology   Procedures:    Antimicrobials:    Subjective: Seen in HD. She is alert, report headaches neck pain improved. Feels better from that   Objective: Vitals:   08/08/22 1200 08/08/22 1232 08/08/22 1242 08/08/22 1409  BP: (!) 153/56 128/63 (!) 145/48 (!) 170/69  Pulse: 66 67 66 66  Resp: '20 19 15 18  '$ Temp:    98.2 F (36.8 C)  TempSrc:    Oral  SpO2: 100% 99% 100% 100%  Weight:      Height:        Intake/Output Summary (Last 24 hours) at 08/08/2022 1527 Last data filed at 08/08/2022 1242 Gross per 24 hour  Intake  123 ml  Output 3000 ml  Net -2877 ml    Filed Weights   08/06/22 1526 08/07/22 0500 08/08/22 0418  Weight: 64.7 kg 64.7 kg 65.5 kg    Examination:  General exam: NAD Respiratory system: CTA Cardiovascular system: S 1, S 2 RRR Gastrointestinal system: BS present, soft nt Central nervous system: Alert.  Extremities: Symmetric power   Data Reviewed: I have personally reviewed following labs and imaging studies  CBC: Recent Labs  Lab 08/06/22 0139 08/07/22 1148 08/08/22 0859  WBC  8.6 8.9 7.3  NEUTROABS 6.2  --   --   HGB 6.7* 9.0* 7.9*  HCT 21.1* 27.6* 24.2*  MCV 117.9* 107.8* 109.0*  PLT 309 289 952    Basic Metabolic Panel: Recent Labs  Lab 08/06/22 0139 08/07/22 1148 08/08/22 0911  NA 138 133* 135  K 4.1 3.7 3.6  CL 99 96* 97*  CO2 '25 22 25  '$ GLUCOSE 113* 122* 127*  BUN 46* 36* 57*  CREATININE 7.30* 5.33* 6.94*  CALCIUM 9.2 9.0 8.3*  PHOS  --   --  4.8*    GFR: Estimated Creatinine Clearance: 5.6 mL/min (A) (by C-G formula based on SCr of 6.94 mg/dL (H)). Liver Function Tests: Recent Labs  Lab 08/06/22 0139 08/08/22 0911  AST 11*  --   ALT 16  --   ALKPHOS 99  --   BILITOT 0.5  --   PROT 6.5  --   ALBUMIN 3.0* 2.4*    No results for input(s): "LIPASE", "AMYLASE" in the last 168 hours. No results for input(s): "AMMONIA" in the last 168 hours. Coagulation Profile: No results for input(s): "INR", "PROTIME" in the last 168 hours. Cardiac Enzymes: No results for input(s): "CKTOTAL", "CKMB", "CKMBINDEX", "TROPONINI" in the last 168 hours. BNP (last 3 results) No results for input(s): "PROBNP" in the last 8760 hours. HbA1C: Recent Labs    08/07/22 1148  HGBA1C 5.5   CBG: Recent Labs  Lab 08/07/22 0714 08/07/22 1123 08/07/22 1620 08/07/22 2110 08/08/22 0711  GLUCAP 94 124* 107* 132* 121*    Lipid Profile: No results for input(s): "CHOL", "HDL", "LDLCALC", "TRIG", "CHOLHDL", "LDLDIRECT" in the last 72 hours. Thyroid Function Tests: Recent Labs    08/07/22 1148  TSH 5.919*   Anemia Panel: Recent Labs    08/07/22 1148  VITAMINB12 1,981*  FOLATE >40.0  FERRITIN 912*  TIBC 234*  IRON 63  RETICCTPCT 3.0   Sepsis Labs: No results for input(s): "PROCALCITON", "LATICACIDVEN" in the last 168 hours.  No results found for this or any previous visit (from the past 240 hour(s)).       Radiology Studies: No results found.      Scheduled Meds:  (feeding supplement) PROSource Plus  30 mL Oral BID BM   sodium  chloride   Intravenous Once   acetaminophen  500 mg Oral BID   amiodarone  100 mg Oral Daily   aspirin EC  81 mg Oral Daily   bisacodyl  20 mg Oral Once   Chlorhexidine Gluconate Cloth  6 each Topical Q0600   diclofenac Sodium  2 g Topical BID   feeding supplement (NEPRO CARB STEADY)  237 mL Oral W41L   folic acid  1 mg Oral Daily   heparin  5,000 Units Subcutaneous Q8H   insulin aspart  0-6 Units Subcutaneous TID WC   levothyroxine  50 mcg Oral QAC breakfast   metoCLOPramide (REGLAN) injection  10 mg Intravenous Q6H   metoprolol tartrate  12.5  mg Oral BID   multivitamin  1 tablet Oral Daily   omeprazole  20 mg Oral BID AC   peg 3350 powder  0.5 kit Oral Once   And   peg 3350 powder  0.5 kit Oral Once   rosuvastatin  10 mg Oral Daily   sevelamer carbonate  2,400 mg Oral TID with meals   sodium chloride flush  3 mL Intravenous Q12H   Continuous Infusions:  anticoagulant sodium citrate       LOS: 2 days    Time spent: 35 minutes    Bria Portales A Almus Woodham, MD [facility]   If 7PM-7AM, please contact night-coverage [email or web address]  08/08/2022, 3:27 PM

## 2022-08-08 NOTE — Progress Notes (Incomplete)
Daily Progress Note   Patient Name: Shawna [name]       Date: 08/08/2022 DOB: [birth year]  Age: 82 y.o. MRN#: [id number] Attending Physician: Elmarie Shiley, MD Primary Care Physician: [facility] Admit Date: 08/06/2022  Reason for Consultation/Follow-up: {Reason for Consult:23484}  Subjective: ***  Length of Stay: 2   Physical Exam          Vital Signs: BP (!) 170/60   Pulse 64   Temp 98 F (36.7 C)   Resp 17   Ht '5\' 2"'$  (1.575 m)   Wt 65.5 kg   SpO2 98%   BMI 26.41 kg/m  SpO2: SpO2: 98 % O2 Device: O2 Device: Room Air O2 Flow Rate:        Palliative Assessment/Data:       Palliative Care Assessment & Plan   Patient Profile: 82 y.o. female  with past medical history of ESRD on MWF HD, CAD s/p CABG, chronic systolic CHF, HLD, HTN, and afib  admitted on 08/06/2022 with generalized weakness.    Patient has had 3 admissions and 4 ED visits in the past 6 months.  She is now admitted with failure to thrive after missing HD. She was last admitted from 12/1-3 for anemia associated with ABLA from her bleeding fistula, not on AC despite afib. She returned on 12/8 with a cough, treated with Amoxil and Doxy with indeterminate CXR. She returned on 1/19 with R-sided headache, diagnosed with occipital neuralgia and treated with nerve block. She returned again on 1/25 with neck pain attributed to the same. She was not started on treatment.    PMT has been consulted to assist with goals of care conversation.  Assessment: ***  Recommendations/Plan: ***   Prognosis:  {Palliative Care Prognosis:23504}  Discharge Planning: {Palliative dispostion:23505}  Care plan was discussed with ***   Total time: I spent *** minutes in the care of the patient today in the  above activities and documenting the encounter.  MDM ***         Norberta Keens, PA-C  Palliative Medicine Team Team phone # [phone]  Thank you for allowing the Palliative Medicine Team to assist in the care of this patient. Please utilize secure chat with additional questions, if there is no response within 30 minutes please call the above phone  number.  Palliative Medicine Team providers are available by phone from 7am to 7pm daily and can be reached through the team cell phone.  Should this patient require assistance outside of these hours, please call the patient's attending physician.

## 2022-08-08 NOTE — Progress Notes (Signed)
  [facility] Progress Note    Assessment/ Plan:   ESRD: -Outpatient orders: Javier Docker, MWF. 3.5hrs. Nipro. 300 BFR. 2k/2,5cal. TDC. Mircera 115mg (last dose 08/01/22) -HD on MWF schedule.  Seen on HD  3K bath 3L goal 174/63 LIJ TC Tolerating, no changes  FTT -per primary service. Palliative consulted: full scope of care   Volume/ hypertension: EDW 59.5kg. Will UF as tolerated   Anemia of Chronic Kidney Disease, symptomatic anemia: Hemoglobin 6.7 on presentation, transfuse PRN for hgb <7. Just received ESA as an outpatient, will redose accordingly. F/u cbc?   Secondary Hyperparathyroidism/Hyperphosphatemia: resume home binders   Subjective:   No acute events overnight. Denies f/c/n/v/sob.    Objective:   BP (!) 174/65   Pulse 63   Temp 98 F (36.7 C)   Resp 20   Ht '5\' 2"'$  (1.575 m)   Wt 65.5 kg   SpO2 98%   BMI 26.41 kg/m   Intake/Output Summary (Last 24 hours) at 08/08/2022 0957 Last data filed at 08/08/2022 06433Gross per 24 hour  Intake 363 ml  Output 0 ml  Net 363 ml   Weight change: 0.817 kg  Physical Exam: Gen: NAD CVS: RRR Resp: normal WOB Abd: soft, nt/nd Ext: 2+ pitting edema b/l Les Neuro: awake, interactive Dialysis access: Left chest TDC c/d/I, LUE AVG no bruit  Imaging: No results found.  Labs: BMET Recent Labs  Lab 08/06/22 0139 08/07/22 1148  NA 138 133*  K 4.1 3.7  CL 99 96*  CO2 25 22  GLUCOSE 113* 122*  BUN 46* 36*  CREATININE 7.30* 5.33*  CALCIUM 9.2 9.0   CBC Recent Labs  Lab 08/06/22 0139 08/07/22 1148 08/08/22 0859  WBC 8.6 8.9 7.3  NEUTROABS 6.2  --   --   HGB 6.7* 9.0* 7.9*  HCT 21.1* 27.6* 24.2*  MCV 117.9* 107.8* 109.0*  PLT 309 289 264    Medications:     (feeding supplement) PROSource Plus  30 mL Oral BID BM   sodium chloride   Intravenous Once   acetaminophen  500 mg Oral BID   amiodarone  100 mg Oral Daily   aspirin EC  81 mg Oral Daily   Chlorhexidine Gluconate Cloth  6 each  Topical Q0600   diclofenac Sodium  2 g Topical BID   famotidine  20 mg Oral Daily   feeding supplement (NEPRO CARB STEADY)  237 mL Oral QI95J  folic acid  1 mg Oral Daily   heparin  5,000 Units Subcutaneous Q8H   insulin aspart  0-6 Units Subcutaneous TID WC   levothyroxine  50 mcg Oral QAC breakfast   metoprolol tartrate  12.5 mg Oral BID   multivitamin  1 tablet Oral Daily   rosuvastatin  10 mg Oral Daily   sevelamer carbonate  2,400 mg Oral TID with meals   sodium chloride flush  3 mL Intravenous Q12H      JOtelia Santee MD [facility] 08/08/2022, 9:57 AM

## 2022-08-08 NOTE — NC FL2 (Signed)
[place] LEVEL OF CARE FORM     IDENTIFICATION  Patient Name: Shawna [name] Birthdate: 1940-10-22 Sex: female Admission Date (Current Location): 08/06/2022  [facility] [place] Number:  Herbalist and Address:  [facility], [place] [address], [name], [place] [address]      Provider Number: [id number]  Attending Physician Name and Address:  Elmarie Shiley, MD  Relative Name and Phone Number:  Harshbarger,Nathaniel (Spouse) [phone]    Current Level of Care: Hospital Recommended Level of Care: [place] Prior Approval Number:    Date Approved/Denied:   PASRR Number: [id number]  Discharge Plan: SNF    Current Diagnoses: Patient Active Problem List   Diagnosis Date Noted   Delirium 04/22/2022   History of CAD (coronary artery disease) 04/22/2022   NSTEMI, initial episode of care ([place]) 10/19/2021   Symptomatic anemia 09/09/2021   Ischemic cardiomyopathy    Paroxysmal Atrial fibrillation/Flutter 05/07/2021   Neuropathy 04/23/2021   Anemia associated with chronic renal failure 04/13/2021   Left knee pain 03/15/2021   Moderate protein-calorie malnutrition ([place]) 02/27/2021   COVID-19 virus infection 02/03/2021   Leukocytosis 02/03/2021   Hypothyroidism (acquired) 02/03/2021   Myositis 12/03/2020   Ataxia 12/02/2020   Diabetes mellitus type 2 in nonobese ([place]) 11/10/2020   Failure to thrive in adult 11/09/2020   Dialysis AV fistula malfunction, initial encounter ([place])    Jugular vein occlusion, right ([place])    Failure of surgically constructed arteriovenous fistula ([place]) 10/03/2020   Myoclonus 08/31/2020   Clotted renal dialysis AV graft, initial encounter ([place])    Hemodialysis-associated hypotension    Irritable bowel syndrome 02/25/2020   Adenomatous duodenal polyp 09/10/2019   History of GI bleed 09/10/2019   Hand steal syndrome ([place]) 08/01/2017   Coronary artery disease 54/62/7035   Complication of  vascular access for dialysis 03/19/2017   Preoperative clearance 01/25/2017   H/O non-ST elevation myocardial infarction (NSTEMI) 10/24/2016   Non-ST elevation (NSTEMI) myocardial infarction [facility])    Cardiac arrest [facility])    Palliative care encounter    Goals of care, counseling/discussion    Flash pulmonary edema ([place]) 04/06/2016   History of colon cancer 01/27/2016   History of ovarian cancer 01/27/2016   PAF (paroxysmal atrial fibrillation) ([place]) 10/14/2015   Malignant neoplasm of right ovary ([place]) 10/14/2015   SVT (supraventricular tachycardia) 09/08/2015   Dyspnea    Occlusion and stenosis of carotid artery without mention of cerebral infarction 01/24/2013   Hx of CABG 07/05/2012   Carotid artery disease ([place]) 07/05/2012   Mitral regurgitation 06/12/2012   Non-STEMI (non-ST elevated myocardial infarction) ([place]) 06/08/2012   Chronic combined systolic and diastolic CHF (congestive heart failure) ([place]) 05/02/2012   AVM (arteriovenous malformation) of small bowel, acquired 01/20/2012   GERD (gastroesophageal reflux disease) 01/09/2012   HLD (hyperlipidemia) 01/05/2012   Atherosclerotic heart disease of native coronary artery without angina pectoris 12/16/2011   Anxiety disorder 05/04/2011   ESRD (end stage renal disease) on dialysis ([place]) 04/29/2011   Gout 04/29/2011    Orientation RESPIRATION BLADDER Height & Weight     Self  Normal Continent Weight: 144 lb 6.4 oz (65.5 kg) Height:  '5\' 2"'$  (157.5 cm)  BEHAVIORAL SYMPTOMS/MOOD NEUROLOGICAL BOWEL NUTRITION STATUS      Continent Diet (see d/c summary)  AMBULATORY STATUS COMMUNICATION OF NEEDS Skin   Total Care Verbally Normal  Personal Care Assistance Level of Assistance  Bathing, Feeding, Dressing Bathing Assistance: Maximum assistance Feeding assistance: Limited assistance Dressing Assistance: Maximum assistance     Functional Limitations Info  Sight, Hearing, Speech Sight Info:  Adequate Hearing Info: Adequate Speech Info: Adequate    SPECIAL CARE FACTORS FREQUENCY  PT (By licensed PT), OT (By licensed OT)     PT Frequency: 5x/ week OT Frequency: 5x/ week            Contractures Contractures Info: Not present    Additional Factors Info  Code Status, Insulin Sliding Scale, Allergies Code Status Info: Full Allergies Info: Amlodipine  Aspirin  Nitrofurantoin  Bactrim (Sulfamethoxazole-trimethoprim)  Contrast Media (Iodinated Contrast Media)  Iron  Tylenol (Acetaminophen)  Gabapentin  Iron Sucrose  Ranexa (Ranolazine)  Sucroferric Oxyhydroxide  Dexilant (Dexlansoprazole)  Hydralazine  Levaquin (Levofloxacin In D5w)  Morphine And Related  Pantoprazole  Plavix (Clopidogrel Bisulfate)  Protonix (Pantoprazole Sodium)  Venofer (Ferric Oxide)   Insulin Sliding Scale Info: see d/c medication list       Current Medications (08/08/2022):  This is the current hospital active medication list Current Facility-Administered Medications  Medication Dose Route Frequency Provider Last Rate Last Admin   (feeding supplement) PROSource Plus liquid 30 mL  30 mL Oral BID BM Regalado, Belkys A, MD   30 mL at 08/07/22 2119   0.9 %  sodium chloride infusion (Manually program via Guardrails IV Fluids)   Intravenous Once Karmen Bongo, MD   Paused at 08/06/22 [phone]   acetaminophen (TYLENOL) tablet 500 mg  500 mg Oral BID Regalado, Belkys A, MD   500 mg at 08/08/22 1424   alteplase (CATHFLO ACTIVASE) injection 2 mg  2 mg Intracatheter Once PRN Karmen Bongo, MD       amiodarone (PACERONE) tablet 100 mg  100 mg Oral Daily Karmen Bongo, MD   100 mg at 08/08/22 1425   anticoagulant sodium citrate solution 5 mL  5 mL Intracatheter PRN Karmen Bongo, MD       aspirin EC tablet 81 mg  81 mg Oral Daily Karmen Bongo, MD   81 mg at 08/08/22 1432   bisacodyl (DULCOLAX) EC tablet 20 mg  20 mg Oral Once Gribbin, Sarah J, PA-C       calcium carbonate (dosed in mg elemental calcium)  suspension 500 mg of elemental calcium  500 mg of elemental calcium Oral Q6H PRN Karmen Bongo, MD       camphor-menthol [facility]) lotion 1 Application  1 Application Topical G3T PRN Karmen Bongo, MD       And   hydrOXYzine (ATARAX) tablet 25 mg  25 mg Oral Q8H PRN Karmen Bongo, MD   25 mg at 08/07/22 2119   Chlorhexidine Gluconate Cloth 2 % PADS 6 each  6 each Topical Q0600 Karmen Bongo, MD   6 each at 08/08/22 0553   diclofenac Sodium (VOLTAREN) 1 % topical gel 2 g  2 g Topical BID Regalado, Belkys A, MD   2 g at 08/07/22 2131   docusate sodium (ENEMEEZ) enema 283 mg  1 enema Rectal PRN Karmen Bongo, MD       feeding supplement (NEPRO CARB STEADY) liquid 237 mL  237 mL Oral Q24H Regalado, Belkys A, MD       fentaNYL (SUBLIMAZE) injection 25 mcg  25 mcg Intravenous Q4H PRN Regalado, Belkys A, MD       folic acid (FOLVITE) tablet 1 mg  1 mg Oral Daily Karmen Bongo, MD   1  mg at 08/08/22 1425   heparin injection 1,000 Units  1,000 Units Intracatheter PRN Karmen Bongo, MD       heparin injection 5,000 Units  5,000 Units Subcutaneous Lynne Logan, MD   5,000 Units at 08/08/22 0550   insulin aspart (novoLOG) injection 0-6 Units  0-6 Units Subcutaneous TID WC Karmen Bongo, MD       levothyroxine (SYNTHROID) tablet 50 mcg  50 mcg Oral QAC breakfast Karmen Bongo, MD   50 mcg at 08/08/22 0550   metoCLOPramide (REGLAN) injection 10 mg  10 mg Intravenous Q6H Gribbin, Sarah J, PA-C       metoprolol tartrate (LOPRESSOR) tablet 12.5 mg  12.5 mg Oral BID Regalado, Belkys A, MD   12.5 mg at 08/07/22 2119   multivitamin (RENA-VIT) tablet 1 tablet  1 tablet Oral Daily Karmen Bongo, MD   1 tablet at 08/08/22 1432   naloxone [facility]) injection 0.4 mg  0.4 mg Intravenous PRN Howerter, Justin B, DO       omeprazole (PRILOSEC) capsule 20 mg  20 mg Oral BID AC Gribbin, Sarah J, PA-C       ondansetron [facility]) tablet 4 mg  4 mg Oral Q6H PRN Karmen Bongo, MD       Or   ondansetron  [facility]) injection 4 mg  4 mg Intravenous Q6H PRN Karmen Bongo, MD       Oral care mouth rinse  15 mL Mouth Rinse PRN [place] Crane, Carole N, DO       peg 3350 powder (MOVIPREP) kit 100 g  0.5 kit Oral Once Gribbin, Sarah J, PA-C       And   peg 3350 powder (MOVIPREP) kit 100 g  0.5 kit Oral Once Vena Rua, PA-C       rosuvastatin (CRESTOR) tablet 10 mg  10 mg Oral Daily Karmen Bongo, MD   10 mg at 08/07/22 2119   sevelamer carbonate (RENVELA) tablet 2,400 mg  2,400 mg Oral TID with meals Karmen Bongo, MD   2,400 mg at 08/08/22 0745   sodium chloride flush (NS) 0.9 % injection 3 mL  3 mL Intravenous Lillia Mountain, MD   3 mL at 08/07/22 2125   sorbitol 70 % solution 30 mL  30 mL Oral PRN Karmen Bongo, MD         Discharge Medications: Please see discharge summary for a list of discharge medications.  Relevant Imaging Results:  Relevant Lab Results:   Additional Information ss#[id number].  Dialysis patient MWF at [facility] (10:30am arrival time). Moderna COVID-19 Vaccine 10/04/2019 , 09/04/2019  Milinda Antis, LCSWA

## 2022-08-08 NOTE — TOC Initial Note (Signed)
Transition of Care [facility]) - Initial/Assessment Note    Patient Details  Name: Shawna [name] MRN: [id number] Date of Birth: [birth year]  Transition of Care [facility]) CM/SW Contact:    Milinda Antis, [place] Phone Number: 08/08/2022, 3:56 PM  Clinical Narrative:                 CSW received consult for possible SNF placement at time of discharge. CSW spoke with patient's spouse and daughter.  Family expressed understanding of PT recommendation and is agreeable to SNF placement at time of discharge. Patient reports preference for a facility in [place] or [place], [place]. CSW discussed insurance authorization process and will provide Medicare SNF ratings list. CSW will send out referrals for review and provide bed offers as available.   Skilled Nursing Rehab Facilities-   [email or web address]   Ratings out of 5 stars (5 the highest)   Name Address  Phone # [place] Inspection Overall  [facility] [address] [place] '4 5 2 3  '$ [facility]  [address] [place], [place] [phone] '4 2 5 5  '$ [facility] [place], [place] '1 3 1 1  '$ [facility] De[place], [place] '2 2 4 4  '$ [facility] [address], [place] '2 1 2 1  '$ [place] [address] [phone] '3 3 4 4  '$ [facility] [address], [place] '4 1 3 2  '$ [facility] [address] [place] '4 1 3 2  '$ [address] ([place]) [place], [address] [phone] '3 1 2 1  '$ [facility] [address], [facility] [phone] '3 1 1 1  '$ [facility] [address], [facility] [phone] '3 2 2 2  '$ [facility] ([place]) [place]. [address], [place] [phone] '3 1 1 1  '$ [place] [address] [place] '4 2 4 4          '$ [place] [address] [place] '4 1 3 2  '$ [facility] [place] [address] [place] '3 1 5 4  '$ [facility], [place] [address], [place] [phone] '1 1 2 1  '$ [facility] [address], [facility] [phone] '2 2 4 4          '$ [address] (no [facility]) [place] [facility] [address], [place] [phone] '5 5 5 5  '$ [facility] (No Humana) [address], [place] '4 1 4 3  '$ [place] (No UHC) [place], [place] '5 5 5 5  '$ [facility] [address] [facility] [phone] '2 3 5 5  '$ [place] [address] [place] '1 1 2 1  '$ [facility] [address], [address] '3 1 1 1  '$ [place], [place] '5 2 5 5  '$ [facility]  [address], [place] '2 2 1 1  '$ [facility] [address], [place] '3 2 1 1  '$ [facility] [place], [place] '2 2 2 2          '$ [facility] [address] [place] '1 1 1 1  '$ [address] [place]  [phone] '2 4 3 3  '$ [facility] [place] [address], [phone] '3 2 3 3  '$ [facility] [place] [address], [place] '2 1 1 1  '$ [place] (No Humana) [address] [place] [phone] '2 2 3 3  '$ [facility]) [address], [place] [phone] '2 1 1 1          '$ [facility] [place], [place] '5 4 5 5  '$ [facility])  [address], [place] '2 1 2 1  '$ [facility]) [place] [address] [place] '3 1 4 3  '$ [place] [address] [employer] '3 3 4 4  '$ [address] [place], [place] '2 3 1 1  '$ [place] [facility]) [address] [place] [phone] '2 1 4 3     '$ Expected Discharge Plan: [place] Barriers to Discharge: Continued Medical Work up, Ship broker, SNF Pending bed offer   Patient Goals and CMS Choice   CMS Medicare.gov Compare Post Acute Care list provided to:: Patient Represenative (must comment) Choice offered to / list presented to : Spouse, Adult Children      Expected Discharge Plan and Services In-house Referral: Clinical Social Work     Living arrangements for the past 2 months: Single Family Home                                      Prior Living Arrangements/Services Living arrangements for the past 2 months: Single Family Home Lives with:: Spouse Patient language and need for interpreter reviewed:: Yes Do you feel safe going back to the place where you live?: Yes      Need for Family Participation in Patient Care: Yes (Comment) Care giver support system in place?: Yes (comment)   Criminal Activity/Legal Involvement Pertinent to Current Situation/Hospitalization: No - Comment as needed  Activities of Daily Living   ADL Screening (condition at time of admission) Patient's cognitive ability adequate to safely complete daily activities?: Yes Is the patient deaf or have difficulty hearing?: No Does the patient have difficulty seeing, even when wearing glasses/contacts?: No Does the patient have difficulty concentrating, remembering, or making decisions?: Yes Patient able to express need for assistance with ADLs?: Yes Does the patient have difficulty dressing or bathing?: Yes Independently performs ADLs?: No Does the patient have difficulty walking or climbing stairs?: Yes Weakness of Legs: Both Weakness of Arms/Hands: None  Permission Sought/Granted   Permission granted to share information with : Yes, Verbal Permission Granted (patient disoriented, permission given via spouse)     Permission granted to share info w AGENCY: SNFs        Emotional Assessment        Orientation: : Oriented to Self Alcohol / Substance Use: Not Applicable Psych Involvement: No (comment)  Admission diagnosis:  Generalized weakness [R53.1] Symptomatic anemia [D64.9] Patient Active Problem List   Diagnosis Date Noted   Delirium 04/22/2022   History of CAD (coronary artery disease) 04/22/2022   NSTEMI, initial episode of care ([place]) 10/19/2021   Symptomatic anemia 09/09/2021   Ischemic cardiomyopathy    Paroxysmal Atrial fibrillation/Flutter 05/07/2021   Neuropathy 04/23/2021   Anemia associated with chronic renal failure 04/13/2021   Left knee pain 03/15/2021   Moderate protein-calorie malnutrition ([place]) 02/27/2021   COVID-19 virus infection 02/03/2021   Leukocytosis 02/03/2021   Hypothyroidism (acquired) 02/03/2021   Myositis 12/03/2020   Ataxia 12/02/2020   Diabetes  mellitus type 2 in nonobese ([place]) 11/10/2020   Failure to thrive in adult 11/09/2020   Dialysis AV fistula malfunction, initial encounter ([place])    Jugular vein occlusion, right ([name])    Failure of surgically constructed arteriovenous fistula ([place]) 10/03/2020   Myoclonus 08/31/2020   Clotted renal dialysis AV graft, initial encounter ([place])    Hemodialysis-associated hypotension    Irritable bowel syndrome 02/25/2020   Adenomatous duodenal polyp 09/10/2019   History of GI bleed 09/10/2019   Hand steal syndrome ([place]) 08/01/2017   Coronary artery disease 70/96/2836   Complication of vascular access for dialysis 03/19/2017   Preoperative clearance 01/25/2017   H/O non-ST elevation myocardial infarction (NSTEMI) 10/24/2016   Non-ST elevation (NSTEMI) myocardial infarction [facility])    Cardiac arrest [facility])    Palliative care encounter    Goals of care, counseling/discussion    Flash pulmonary edema ([place]) 04/06/2016   History of colon cancer 01/27/2016   History of ovarian cancer 01/27/2016   PAF (paroxysmal atrial fibrillation) ([place]) 10/14/2015   Malignant neoplasm of right ovary ([place]) 10/14/2015    SVT (supraventricular tachycardia) 09/08/2015   Dyspnea    Occlusion and stenosis of carotid artery without mention of cerebral infarction 01/24/2013   Hx of CABG 07/05/2012   Carotid artery disease ([place]) 07/05/2012   Mitral regurgitation 06/12/2012   Non-STEMI (non-ST elevated myocardial infarction) ([place]) 06/08/2012   Chronic combined systolic and diastolic CHF (congestive heart failure) ([place]) 05/02/2012   AVM (arteriovenous malformation) of small bowel, acquired 01/20/2012   GERD (gastroesophageal reflux disease) 01/09/2012   HLD (hyperlipidemia) 01/05/2012   Atherosclerotic heart disease of native coronary artery without angina pectoris 12/16/2011   Anxiety disorder 05/04/2011   ESRD (end stage renal disease) on dialysis ([place]) 04/29/2011   Gout 04/29/2011   PCP:  Practice, [place]:   [facility] [address], [place] [address] Phone: [phone] Fax: [phone]  [facility] (ESRD Bundle Only) - [place], [address] [place] [address] Phone: [phone] Fax: [phone]     Social Determinants of Health ([place]) Social History: [place]: No Food Insecurity (08/06/2022)  Housing: Low Risk  (08/06/2022)  Transportation Needs: No Transportation Needs (08/06/2022)  Utilities: Not At Risk (08/06/2022)  Financial Resource Strain: Low Risk  (12/14/2018)  Physical Activity: Unknown (12/14/2018)  Social Connections: Unknown (12/14/2018)  Stress: No Stress Concern Present (12/14/2018)  Tobacco Use: Low Risk  (08/06/2022)   SDOH Interventions:     Readmission Risk Interventions    11/12/2021    8:56 AM 10/20/2021    4:38 PM 07/23/2021    4:26 PM  Readmission Risk Prevention Plan  Transportation Screening Complete Complete Complete  Medication Review Press photographer) Complete Complete Complete  PCP or Specialist appointment within 3-5 days of discharge Complete  Complete Complete  HRI or Home Care Consult Complete Complete Complete  SW Recovery Care/Counseling Consult Complete Complete Complete  Palliative Care Screening Not Applicable Not Applicable Not Applicable  Skilled Nursing Facility Complete Not Applicable Not Applicable

## 2022-08-08 NOTE — Progress Notes (Signed)
Pt receives out-pt HD at [facility] on MWF. Pt arrives at 10:30 for 10:45 chair time. Clinic requesting H and P be faxed. Clinicals faxed today per clinic request for continuation of care. Will assist as needed.    Melven Sartorius Renal Navigator [phone]

## 2022-08-09 ENCOUNTER — Encounter (HOSPITAL_COMMUNITY)
Admission: EM | Disposition: A | Discharge status: Skilled Nursing Facility | Payer: Self-pay | Source: Home / Self Care | Attending: Family Medicine

## 2022-08-09 DIAGNOSIS — D649 Anemia, unspecified: Secondary | ICD-10-CM | POA: Diagnosis not present

## 2022-08-09 DIAGNOSIS — Z711 Person with feared health complaint in whom no diagnosis is made: Secondary | ICD-10-CM

## 2022-08-09 DIAGNOSIS — Z515 Encounter for palliative care: Secondary | ICD-10-CM | POA: Diagnosis not present

## 2022-08-09 DIAGNOSIS — R531 Weakness: Secondary | ICD-10-CM | POA: Diagnosis not present

## 2022-08-09 DIAGNOSIS — R195 Other fecal abnormalities: Secondary | ICD-10-CM | POA: Diagnosis not present

## 2022-08-09 DIAGNOSIS — R627 Adult failure to thrive: Secondary | ICD-10-CM | POA: Diagnosis not present

## 2022-08-09 DIAGNOSIS — Z789 Other specified health status: Secondary | ICD-10-CM

## 2022-08-09 LAB — GLUCOSE, CAPILLARY
Glucose-Capillary: 71 mg/dL (ref 70–99)
Glucose-Capillary: 118 mg/dL — ABNORMAL HIGH (ref 70–99)
Glucose-Capillary: 97 mg/dL (ref 70–99)
Glucose-Capillary: 104 mg/dL — ABNORMAL HIGH (ref 70–99)

## 2022-08-09 LAB — CBC
HCT: 27.7 % — ABNORMAL LOW (ref 36.0–46.0)
Hemoglobin: 8.7 g/dL — ABNORMAL LOW (ref 12.0–15.0)
MCH: 34.4 pg — ABNORMAL HIGH (ref 26.0–34.0)
MCHC: 31.4 g/dL (ref 30.0–36.0)
MCV: 109.5 fL — ABNORMAL HIGH (ref 80.0–100.0)
Platelets: 282 10*3/uL (ref 150–400)
RBC: 2.53 MIL/uL — ABNORMAL LOW (ref 3.87–5.11)
RDW: 21.7 % — ABNORMAL HIGH (ref 11.5–15.5)
WBC: 8.6 10*3/uL (ref 4.0–10.5)
nRBC: 0 % (ref 0.0–0.2)

## 2022-08-09 SURGERY — ESOPHAGOGASTRODUODENOSCOPY (EGD) WITH PROPOFOL
Anesthesia: Monitor Anesthesia Care

## 2022-08-09 SURGERY — EGD (ESOPHAGOGASTRODUODENOSCOPY)
Anesthesia: Monitor Anesthesia Care

## 2022-08-09 MED ORDER — BISACODYL 5 MG PO TBEC
20.0000 mg | DELAYED_RELEASE_TABLET | Freq: Once | ORAL | Status: AC
  Administered 2022-08-09: 20 mg via ORAL
  Filled 2022-08-09: qty 4

## 2022-08-09 MED ORDER — LEVOTHYROXINE SODIUM 75 MCG PO TABS
75.0000 ug | ORAL_TABLET | Freq: Every day | ORAL | Status: DC
  Administered 2022-08-10 – 2022-08-16 (×5): 75 ug via ORAL
  Filled 2022-08-09 (×6): qty 1

## 2022-08-09 MED ORDER — PEG-KCL-NACL-NASULF-NA ASC-C 100 G PO SOLR
0.5000 | Freq: Once | ORAL | Status: AC
  Administered 2022-08-09: 100 g via ORAL

## 2022-08-09 MED ORDER — PEG-KCL-NACL-NASULF-NA ASC-C 100 G PO SOLR
0.5000 | Freq: Once | ORAL | Status: AC
  Administered 2022-08-09: 100 g via ORAL
  Filled 2022-08-09: qty 1

## 2022-08-09 MED ORDER — PEG-KCL-NACL-NASULF-NA ASC-C 100 G PO SOLR: 1.0000 | Freq: Once | ORAL | Status: DC

## 2022-08-09 MED ORDER — METOCLOPRAMIDE HCL 5 MG/ML IJ SOLN
10.0000 mg | Freq: Four times a day (QID) | INTRAMUSCULAR | Status: AC
  Administered 2022-08-09 (×2): 10 mg via INTRAVENOUS
  Filled 2022-08-09 (×2): qty 2

## 2022-08-09 NOTE — Progress Notes (Signed)
Daily Progress Note   Patient Name: Shawna [name]       Date: 08/09/2022 DOB: 05-05-1941  Age: 82 y.o. MRN#: [id number] Attending Physician: Elmarie Shiley, MD Primary Care Physician: [facility] Admit Date: 08/06/2022  Reason for Consultation/Follow-up: Establishing goals of care  Subjective: I have reviewed medical records including EPIC notes and labs. Received report from primary RN - no acute concerns. RN reports patient is pleasantly confused, eating and drinking well.   Went to visit patient at bedside - no family/visitors present. Patient is lying in bed pleasantly awake, alert, oriented to self only, and able to participate in simple conversation - she is not able to make complex medical decisions. No signs or non-verbal gestures of pain or discomfort noted. No respiratory distress, increased work of breathing, or secretions noted. She denies pain or shortness of breath. RN is at bedside assisting patient in drinking bowel prep, which she is accepting. Patient does not remember why she is drinking bowel prep - education provided. Patient tells me the primary RN has her "going and going and going."  3:44 PM Called patient's husband - emotional support provided. Therapeutic listening provided as he reflects over patient's hospital course - he comes to visit her every evening and recognizes she has been confused. Provided updates that patient did not finish bowel prep last night, therefor upper and lower endoscopy had to be rescheduled for tomorrow. Counseled that RN was encouraging patient to drink prep today, hopeful patient may be more accepting during day time hours - husband is hopeful as well. Per his request, education provided on endoscopy procedure. Reviewed that as of  now, HD and GI procedures are scheduled for tomorrow - reviewed that PMT will follow up on Thursday to allow time for procedures and results. He is open to continue [place] Thursday after gaining additional information. Therapeutic listening provided as he tells me she has been on HD for 12-13 years and reviews her medical history.  All questions and concerns addressed. Encouraged to call with questions and/or concerns. He did not wish to take PMT number.   Length of Stay: 3  Current Medications: Scheduled Meds:   (feeding supplement) PROSource Plus  30 mL Oral BID BM   sodium chloride   Intravenous Once   acetaminophen  500 mg Oral BID  amiodarone  100 mg Oral Daily   aspirin EC  81 mg Oral Daily   Chlorhexidine Gluconate Cloth  6 each Topical Q0600   diclofenac Sodium  2 g Topical BID   feeding supplement (NEPRO CARB STEADY)  237 mL Oral V76H   folic acid  1 mg Oral Daily   heparin  5,000 Units Subcutaneous Q8H   insulin aspart  0-6 Units Subcutaneous TID WC   levothyroxine  50 mcg Oral QAC breakfast   metoCLOPramide (REGLAN) injection  10 mg Intravenous Q6H   metoprolol tartrate  12.5 mg Oral BID   multivitamin  1 tablet Oral Daily   omeprazole  20 mg Oral BID AC   peg 3350 powder  0.5 kit Oral Once   rosuvastatin  10 mg Oral Daily   sevelamer carbonate  2,400 mg Oral TID with meals   sodium chloride flush  3 mL Intravenous Q12H    Continuous Infusions:  anticoagulant sodium citrate      PRN Meds: alteplase, anticoagulant sodium citrate, calcium carbonate (dosed in mg elemental calcium), camphor-menthol **AND** hydrOXYzine, docusate sodium, fentaNYL (SUBLIMAZE) injection, heparin, naLOXone (NARCAN)  injection, ondansetron **OR** ondansetron (ZOFRAN) IV, mouth rinse, sorbitol  Physical Exam Vitals and nursing note reviewed.  Constitutional:      General: She is not in acute distress. Pulmonary:     Effort: No respiratory distress.  Skin:    General: Skin is warm and dry.   Neurological:     Mental Status: She is alert. She is disoriented and confused.     Motor: Weakness present.  Psychiatric:        Attention and Perception: Attention normal.        Behavior: Behavior is cooperative.        Cognition and Memory: Cognition is impaired. Memory is impaired.             Vital Signs: BP (!) 155/51 (BP Location: Right Arm)   Pulse (!) 59   Temp 98.2 F (36.8 C) (Oral)   Resp 17   Ht '5\' 2"'$  (1.575 m)   Wt 65.5 kg   SpO2 99%   BMI 26.41 kg/m  SpO2: SpO2: 99 % O2 Device: O2 Device: Room Air O2 Flow Rate:    Intake/output summary:  Intake/Output Summary (Last 24 hours) at 08/09/2022 1534 Last data filed at 08/09/2022 0636 Gross per 24 hour  Intake 243 ml  Output 0 ml  Net 243 ml   LBM: Last BM Date : 08/07/22 Baseline Weight: Weight: 64.7 kg Most recent weight: Weight: 65.5 kg       Palliative Assessment/Data: PPS 40%      Patient Active Problem List   Diagnosis Date Noted   Delirium 04/22/2022   History of CAD (coronary artery disease) 04/22/2022   NSTEMI, initial episode of care ([place]) 10/19/2021   Symptomatic anemia 09/09/2021   Ischemic cardiomyopathy    Paroxysmal Atrial fibrillation/Flutter 05/07/2021   Neuropathy 04/23/2021   Anemia associated with chronic renal failure 04/13/2021   Left knee pain 03/15/2021   Moderate protein-calorie malnutrition ([place]) 02/27/2021   COVID-19 virus infection 02/03/2021   Leukocytosis 02/03/2021   Hypothyroidism (acquired) 02/03/2021   Myositis 12/03/2020   Ataxia 12/02/2020   Diabetes mellitus type 2 in nonobese ([place]) 11/10/2020   Failure to thrive in adult 11/09/2020   Dialysis AV fistula malfunction, initial encounter ([place])    Jugular vein occlusion, right ([place])    Failure of surgically constructed arteriovenous fistula ([place]) 10/03/2020   Myoclonus  08/31/2020   Clotted renal dialysis AV graft, initial encounter ([place])    Hemodialysis-associated hypotension    Irritable bowel syndrome  02/25/2020   Adenomatous duodenal polyp 09/10/2019   History of GI bleed 09/10/2019   Hand steal syndrome ([place]) 08/01/2017   Coronary artery disease 92/42/6834   Complication of vascular access for dialysis 03/19/2017   Preoperative clearance 01/25/2017   H/O non-ST elevation myocardial infarction (NSTEMI) 10/24/2016   Non-ST elevation (NSTEMI) myocardial infarction [facility])    Cardiac arrest [facility])    Palliative care encounter    Goals of care, counseling/discussion    Flash pulmonary edema ([place]) 04/06/2016   History of colon cancer 01/27/2016   History of ovarian cancer 01/27/2016   PAF (paroxysmal atrial fibrillation) ([place]) 10/14/2015   Malignant neoplasm of right ovary ([place]) 10/14/2015   SVT (supraventricular tachycardia) 09/08/2015   Dyspnea    Occlusion and stenosis of carotid artery without mention of cerebral infarction 01/24/2013   Hx of CABG 07/05/2012   Carotid artery disease ([place]) 07/05/2012   Mitral regurgitation 06/12/2012   Non-STEMI (non-ST elevated myocardial infarction) ([place]) 06/08/2012   Chronic combined systolic and diastolic CHF (congestive heart failure) ([place]) 05/02/2012   AVM (arteriovenous malformation) of small bowel, acquired 01/20/2012   GERD (gastroesophageal reflux disease) 01/09/2012   HLD (hyperlipidemia) 01/05/2012   Atherosclerotic heart disease of native coronary artery without angina pectoris 12/16/2011   Anxiety disorder 05/04/2011   ESRD (end stage renal disease) on dialysis [facility]) 04/29/2011   Gout 04/29/2011    Palliative Care Assessment & Plan   Patient Profile: 82 y.o. female  with past medical history of ESRD on MWF HD, CAD s/p CABG, chronic systolic CHF, HLD, HTN, and afib  admitted on 08/06/2022 with generalized weakness.    Patient has had 3 admissions and 4 ED visits in the past 6 months.  She is now admitted with failure to thrive after missing HD. She was last admitted from 12/1-3 for anemia associated with ABLA from her bleeding  fistula, not on AC despite afib. She returned on 12/8 with a cough, treated with Amoxil and Doxy with indeterminate CXR. She returned on 1/19 with R-sided headache, diagnosed with occipital neuralgia and treated with nerve block. She returned again on 1/25 with neck pain attributed to the same. She was not started on treatment.   Assessment: Principal Problem:   Failure to thrive in adult Active Problems:   HLD (hyperlipidemia)   Hx of CABG   ESRD (end stage renal disease) on dialysis (HCC)   Anemia associated with chronic renal failure   Paroxysmal Atrial fibrillation/Flutter   Recommendations/Plan: Continue full code/full scope treatment Proceed with upper and lower endoscopy tomorrow 1/31 if patient tolerates prep Ongoing GOC pending patient's clinical course PMT will follow peripherally 1/31 and continue [place] Thursday 2/1. If there are any imminent needs please call the service directly   Goals of Care and Additional Recommendations: Limitations on Scope of Treatment: Full Scope Treatment  Code Status:    Code Status Orders  (From admission, onward)           Start     Ordered   08/06/22 0915  Full code  Continuous       Question:  By:  Answer:  Consent: discussion documented in EHR   08/06/22 0915           Code Status History     Date Active Date Inactive Code Status Order ID Comments User Context   06/10/2022 2022 06/12/2022  1927 Full Code [id number]  Truett Mainland, DO Inpatient   04/21/2022 0425 04/27/2022 0031 Full Code [id number]  Rolla Plate, DO ED   11/08/2021 2151 11/25/2021 0126 Full Code [id number]  Rise Patience, MD ED   10/19/2021 0415 10/21/2021 0254 Full Code [id number]  Clance Boll, MD ED   09/09/2021 1947 09/11/2021 0041 Full Code [id number]  Drexel, [place], DO ED   08/15/2021 2241 08/20/2021 2017 Full Code [id number]  Bethena Roys, MD Inpatient   07/16/2021 0723 07/24/2021 1800 Full Code [id number]  Murlean Iba, MD ED    07/07/2021 0223 07/08/2021 2119 Full Code [id number]  Rise Patience, MD ED   04/23/2021 2207 04/24/2021 1645 Full Code [id number]  Elgergawy, Silver Huguenin, MD Inpatient   04/09/2021 1448 04/10/2021 1838 Full Code [id number]  Barton Dubois, MD ED   03/15/2021 0345 03/17/2021 0021 Full Code [id number]  Reubin Milan, MD ED   03/15/2021 0345 03/15/2021 0345 Full Code [id number]  Reubin Milan, MD ED   02/27/2021 0629 02/28/2021 2011 Full Code [id number]  McGregor, [place], DO ED   02/03/2021 2113 02/07/2021 1927 Full Code [id number]  Bernadette Hoit, DO ED   12/02/2020 2349 12/17/2020 1836 Full Code [id number]  Howerter, Ethelda Chick, DO ED   11/09/2020 2111 11/11/2020 1828 Full Code [id number]  [place], Lula, DO Inpatient   10/03/2020 2255 10/07/2020 2324 Full Code [id number]  Truett Mainland, DO Inpatient   09/29/2020 0800 09/29/2020 1917 Full Code [id number]  McConnells, [place], DO ED   08/31/2020 0158 09/02/2020 2143 Full Code [id number]  Zierle-Ghosh, Asia B, DO ED   06/13/2020 0104 06/20/2020 2329 Full Code [id number]  Wilber Oliphant, MD ED   05/06/2020 1522 05/07/2020 1850 Full Code [id number]  Murlean Iba, MD ED   10/14/2019 1127 10/15/2019 1553 Full Code [id number]  Murlean Iba, MD Inpatient   06/17/2019 1925 06/20/2019 2234 Full Code [id number]  Bethena Roys, MD Inpatient   06/03/2019 2346 06/06/2019 0125 Full Code [id number]  Vianne Bulls, MD Inpatient   03/08/2019 0911 03/10/2019 1722 Full Code [id number]  Paoli, [place], DO Inpatient   12/25/2018 2015 12/27/2018 2134 Full Code [id number]  Rise Patience, MD Inpatient   12/14/2018 0422 12/15/2018 1939 Full Code [id number]  Jani Gravel, MD ED   07/13/2018 0825 07/14/2018 1834 Full Code [id number]  Louellen Molder, MD ED   07/13/2018 0503 07/13/2018 0824 Full Code [id number]  Reubin Milan, MD ED   07/13/2018 0503 07/13/2018 0503 Full Code [id number]  Reubin Milan, MD ED   02/13/2018 1628 02/15/2018 2327 Full Code [id number]  Phillips Grout, MD ED   10/15/2017 0948 10/16/2017 2208 Full Code [id number]  Barton Dubois, MD Inpatient   07/14/2017 0849 07/15/2017 1727 Full Code [id number]  Murlean Iba, MD ED   06/06/2017 1458 06/06/2017 1958 Full Code [id number]  Katha Cabal, MD Inpatient   03/22/2017 0820 04/05/2017 1850 Full Code [id number]  Theodis Blaze, MD Inpatient   02/08/2017 2200 02/09/2017 1959 Full Code [id number]  Cheryln Manly, NP Inpatient   01/20/2017 0526 01/21/2017 1446 Full Code [id number]  Etta Quill, DO ED   10/24/2016 2154 10/25/2016 1832 Full Code [id number]  Lily Kocher, MD ED   10/10/2016 0418 10/14/2016 2252 Full Code [id number]  Norval Morton, MD ED   09/19/2016 1735 09/22/2016 2031 Full Code [id number]  Delos Haring, PA-C Inpatient   09/07/2016 0339 09/08/2016 1752 Full Code [id number]  Phillips Grout, MD ED   07/27/2016 1147 08/02/2016 2308 Full Code [id number]  Orvan Falconer, MD ED   07/08/2016 2339 07/10/2016 2119 Full Code [id number]  Rise Patience, MD ED   05/09/2016 0138 05/26/2016 1924 Full Code [id number]  Vianne Bulls, MD ED   04/06/2016 0510 04/08/2016 2106 Full Code [id number]  Oswald Hillock, MD Inpatient   01/27/2016 2313 01/28/2016 2245 Full Code [id number]  Roney Jaffe, MD Inpatient   09/08/2015 1806 09/10/2015 1939 Full Code [id number]  Erline Hau, MD Inpatient   08/30/2015 1756 09/03/2015 1938 Full Code [id number]  Samuella Cota, MD Inpatient   05/03/2015 1809 05/05/2015 1601 Full Code [id number]  Doree Albee, MD ED   01/21/2015 2014 01/27/2015 2100 Full Code [id number]  Germain Osgood, PA-C Inpatient   09/30/2014 0322 10/02/2014 1435 Full Code [id number]  Deneise Lever, MD Inpatient   08/24/2014 0947 08/25/2014 1819 Full Code [id number]  Erline Hau, MD Inpatient   12/10/2013 1508 12/10/2013 2203 Full Code [id number]  [name], MD Inpatient   11/26/2013 1548 11/28/2013 2145 Full Code [id number]  Elmarie Shiley, MD ED   05/29/2013 1108 06/01/2013  1634 Full Code [id number]  Charlann Lange, MD ED   03/21/2013 1257 03/27/2013 2009 Full Code [id number]  Kelvin Cellar, MD Inpatient   09/05/2012 2323 09/06/2012 1651 Full Code [id number]  Charlann Lange, MD Inpatient   06/17/2012 0932 06/20/2012 1908 Full Code [id number]  Grace Isaac, MD Inpatient   06/13/2012 1425 06/17/2012 0932 Full Code [id number]  Lucy Chris, RN Inpatient       Prognosis:  Poor long-term prognosis given progressive decline, multiple hospitalizations, and multiple chronic comorbidities   Discharge Planning: To Be Determined  Care plan was discussed with primary RN, patient, patient's husband  Thank you for allowing the Palliative Medicine Team to assist in the care of this patient.   Lin Landsman, NP  Please contact Palliative Medicine Team phone at [phone] for questions and concerns.   *Portions of this note are a verbal dictation therefore any spelling and/or grammatical errors are due to the "[place] [employer]" system interpretation.

## 2022-08-09 NOTE — Progress Notes (Signed)
Pt reminded of the importance to drink her Moviprep for colonoscopy. Pt refused.

## 2022-08-09 NOTE — Progress Notes (Signed)
TRH night cross cover note:   I was notified by RN that this patient, in spite of recurrent encouragement, has refused to complete her moviprep bowel prep overnight.    Babs Bertin, DO Hospitalist

## 2022-08-09 NOTE — Progress Notes (Signed)
PROGRESS NOTE    Shawna [name]  AYT:[id number] DOB: [birth year] DOA: 08/06/2022 PCP: [facility]   Brief Narrative: 82 year old with past medical history significant for ESRD on hemodialysis MWF, CAD status post CABG, chronic systolic heart failure, hyperlipidemia, hypertension, A-fib presented with generalized weakness.  Her husband reports patient been complaining of severe headache around the ear and down the neck, difficulty turning her neck, difficulty walking.  Symptoms started about a week ago.  She had a couple of shots in her neck on 1/19 and she was given a pill for it on 1/25.  It is eased some  but is still.  Patient missed HD.  Patient was last admitted 12/1-3 for anemia associated with ABLA from her fistula, not on AC despite A-fib.  She returned 12/8 with cough treated with antibiotics with in the terminated chest x-ray.  She returned 1/19 with right-sided headache, diagnosed with occipital neuralgia and treated with nerve block.  Return 1/25 with neck pain attributed to the same.   Admitted with anemia, FTT, worsening confusion. Plan for endoscopy/colonoscopy tomorrow.   Assessment & Plan:   Principal Problem:   Failure to thrive in adult Active Problems:   HLD (hyperlipidemia)   Hx of CABG   ESRD (end stage renal disease) on dialysis (HCC)   Anemia associated with chronic renal failure   Paroxysmal Atrial fibrillation/Flutter   1-FTT: -Patient with h/o ESRD, she has missed a couple of recent sessions due to progressive weakness, difficulty ambulating. -She has been seen in the ER twice recently (1/19 and 1/25) for occipital neuralgia -Anemia, received blood transfusion.  -Palliative care consulted for goals of care, full scope of treatment -PT OT eval, needs SNF She has been more alert. Confuse.   Encephalopathy acute on chronic.  Delirium  Family report confusion since December, worse over last week  Last week confuse was worse.  TSH 5.9---Will  increase synthroid.  B 12; 1,981 Will check ammonia level.   ESRD on HD: Hemodialysis MWF Nephrology consulted Continue Rena-Vite and  Sevelamer  Anemia: Presents with hb 6 Anemia of CKD 1 unit of packed red blood cell order Hb increase to 9/ post transfusion----7.9 Monitor for melena.  Occult blood positive.  GI consulted, planning endoscopy/colonoscopy tomorrow.  She refuse pre last night. Plan to try for tomorrow.  Continue with  PPI.   CAD:  Status post CABG Holding metoprolol due to soft low blood pressure Continue with aspirin  HTN:  Holding metoprolol due to soft blood pressure  A fib;  Continue with amiodarone Not a candidate for [facility]  Hypothyroidism:  Continue with Synthroid  DM;  A1c: Continue with a sliding scale insulin  Headaches, Occipital neuralgia. Received nerve block Improved on schedule tylenol,  voltaren gel. , heating patch.     Estimated body mass index is 26.41 kg/m as calculated from the following:   Height as of this encounter: '5\' 2"'$  (1.575 m).   Weight as of this encounter: 65.5 kg.   DVT prophylaxis: Heparin  Code Status: Full code Family Communication: care discussed with daughter 1/28 and 1/30. Disposition Plan:  Status is: Inpatient Remains inpatient appropriate because: management of AMS. Anemia     Consultants:  Nephrology   Procedures:    Antimicrobials:    Subjective: She is alert, pleasantly confuse. Report improvement of headaches and neck pain.   Objective: Vitals:   08/08/22 2040 08/09/22 0523 08/09/22 0808 08/09/22 1639  BP: (!) 165/52 (!) 173/59 (!) 155/51 (!) 163/64  Pulse:  66 60 (!) 59 62  Resp: '18 18 17 17  '$ Temp: 98.2 F (36.8 C) 98.1 F (36.7 C) 98.2 F (36.8 C) 98.2 F (36.8 C)  TempSrc: Oral Oral Oral Oral  SpO2: 100% 100% 99% 100%  Weight:      Height:        Intake/Output Summary (Last 24 hours) at 08/09/2022 1659 Last data filed at 08/09/2022 1600 Gross per 24 hour  Intake 1043 ml   Output 0 ml  Net 1043 ml    Filed Weights   08/06/22 1526 08/07/22 0500 08/08/22 0418  Weight: 64.7 kg 64.7 kg 65.5 kg    Examination:  General exam: NAD Respiratory system: CTA Cardiovascular system: S 1, S 2 RRR Gastrointestinal system: BS present, soft nt Central nervous system: Alert.  Extremities: Symmetric power   Data Reviewed: I have personally reviewed following labs and imaging studies  CBC: Recent Labs  Lab 08/06/22 0139 08/07/22 1148 08/08/22 0859 08/09/22 0559  WBC 8.6 8.9 7.3 8.6  NEUTROABS 6.2  --   --   --   HGB 6.7* 9.0* 7.9* 8.7*  HCT 21.1* 27.6* 24.2* 27.7*  MCV 117.9* 107.8* 109.0* 109.5*  PLT 309 289 264 875    Basic Metabolic Panel: Recent Labs  Lab 08/06/22 0139 08/07/22 1148 08/08/22 0911  NA 138 133* 135  K 4.1 3.7 3.6  CL 99 96* 97*  CO2 '25 22 25  '$ GLUCOSE 113* 122* 127*  BUN 46* 36* 57*  CREATININE 7.30* 5.33* 6.94*  CALCIUM 9.2 9.0 8.3*  PHOS  --   --  4.8*    GFR: Estimated Creatinine Clearance: 5.6 mL/min (A) (by C-G formula based on SCr of 6.94 mg/dL (H)). Liver Function Tests: Recent Labs  Lab 08/06/22 0139 08/08/22 0911  AST 11*  --   ALT 16  --   ALKPHOS 99  --   BILITOT 0.5  --   PROT 6.5  --   ALBUMIN 3.0* 2.4*    No results for input(s): "LIPASE", "AMYLASE" in the last 168 hours. No results for input(s): "AMMONIA" in the last 168 hours. Coagulation Profile: No results for input(s): "INR", "PROTIME" in the last 168 hours. Cardiac Enzymes: No results for input(s): "CKTOTAL", "CKMB", "CKMBINDEX", "TROPONINI" in the last 168 hours. BNP (last 3 results) No results for input(s): "PROBNP" in the last 8760 hours. HbA1C: Recent Labs    08/07/22 1148  HGBA1C 5.5    CBG: Recent Labs  Lab 08/08/22 1613 08/08/22 2047 08/09/22 0719 08/09/22 1109 08/09/22 1640  GLUCAP 136* 102* 71 118* 97    Lipid Profile: No results for input(s): "CHOL", "HDL", "LDLCALC", "TRIG", "CHOLHDL", "LDLDIRECT" in the last  72 hours. Thyroid Function Tests: Recent Labs    08/07/22 1148  TSH 5.919*    Anemia Panel: Recent Labs    08/07/22 1148  VITAMINB12 1,981*  FOLATE >40.0  FERRITIN 912*  TIBC 234*  IRON 63  RETICCTPCT 3.0    Sepsis Labs: No results for input(s): "PROCALCITON", "LATICACIDVEN" in the last 168 hours.  No results found for this or any previous visit (from the past 240 hour(s)).       Radiology Studies: No results found.      Scheduled Meds:  (feeding supplement) PROSource Plus  30 mL Oral BID BM   sodium chloride   Intravenous Once   acetaminophen  500 mg Oral BID   amiodarone  100 mg Oral Daily   aspirin EC  81 mg Oral Daily  Chlorhexidine Gluconate Cloth  6 each Topical Q0600   diclofenac Sodium  2 g Topical BID   feeding supplement (NEPRO CARB STEADY)  237 mL Oral Z85Y   folic acid  1 mg Oral Daily   heparin  5,000 Units Subcutaneous Q8H   insulin aspart  0-6 Units Subcutaneous TID WC   levothyroxine  50 mcg Oral QAC breakfast   metoCLOPramide (REGLAN) injection  10 mg Intravenous Q6H   metoprolol tartrate  12.5 mg Oral BID   multivitamin  1 tablet Oral Daily   omeprazole  20 mg Oral BID AC   peg 3350 powder  0.5 kit Oral Once   rosuvastatin  10 mg Oral Daily   sevelamer carbonate  2,400 mg Oral TID with meals   sodium chloride flush  3 mL Intravenous Q12H   Continuous Infusions:  anticoagulant sodium citrate       LOS: 3 days    Time spent: 35 minutes    Kahlani Graber A Jaelyn Cloninger, MD [facility]   If 7PM-7AM, please contact night-coverage [email or web address]  08/09/2022, 4:59 PM

## 2022-08-09 NOTE — Care Management Important Message (Signed)
Important Message  Patient Details  Name: Shawna [name] MRN: [id number] Date of Birth: 10-27-1940   Medicare Important Message Given:  Yes     Zuhayr Deeney Montine Circle 08/09/2022, 2:55 PM

## 2022-08-09 NOTE — Progress Notes (Signed)
Pt acknowledge importance of taking moviprep but states "I don't want to take it." MD on call notified.

## 2022-08-09 NOTE — Progress Notes (Signed)
    Progress Note   Assessment    82 year old female with medical history including but not limited to ESRD on dialysis, atrial fibrillation previously on anticoagulation, CAD, heart failure and GI history as below presenting with acute on chronic anemia and heme positive stool    Recommendations   Acute on chronic anemia and heme positive stool and patient requiring transfusion with history of duodenal adenoma and multiple colonic adenomatous plus colon cancer --I had discussed upper and lower endoscopy with the patient but obtained consent from her husband yesterday.  She did not complete bowel prep.  I explained the rationale for this test to her again today and she seems to understand.  I am not sure if she will be able to retain this information -- Attempt to repeat bowel prep during the day today to hopefully make it easier for her -- Rescheduled EGD and colonoscopy for tomorrow -- Hgb stable and better post transfusion   Chief Complaint   No complaints this morning Did not drink bowel prep Remembers talking to me about colonoscopy yesterday Denies abdominal pain Thinks that she is at school  Vital signs in last 24 hours: Temp:  [97.7 F (36.5 C)-98.2 F (36.8 C)] 98.2 F (36.8 C) (01/30 0808) Pulse Rate:  [59-68] 59 (01/30 0808) Resp:  [17-19] 17 (01/30 0808) BP: (155-177)/(51-65) 155/51 (01/30 0808) SpO2:  [98 %-100 %] 99 % (01/30 0808) Last BM Date : 08/07/22  Gen: awake, alert, NAD HEENT: anicteric, op clear CV: RRR, no mrg Pulm: CTA b/l Abd: soft, NT/ND, +BS throughout Ext: no c/c/e Neuro: nonfocal  Intake/Output from previous day: 01/29 0701 - 01/30 0700 In: 243 [P.O.:240; I.V.:3] Out: 3000  Intake/Output this shift: No intake/output data recorded.  Lab Results: Recent Labs    08/07/22 1148 08/08/22 0859 08/09/22 0559  WBC 8.9 7.3 8.6  HGB 9.0* 7.9* 8.7*  HCT 27.6* 24.2* 27.7*  PLT 289 264 282   BMET Recent Labs    08/07/22 1148  08/08/22 0911  NA 133* 135  K 3.7 3.6  CL 96* 97*  CO2 22 25  GLUCOSE 122* 127*  BUN 36* 57*  CREATININE 5.33* 6.94*  CALCIUM 9.0 8.3*   LFT Recent Labs    08/08/22 0911  ALBUMIN 2.4*   PT/INR No results for input(s): "LABPROT", "INR" in the last 72 hours. Hepatitis Panel Recent Labs    08/06/22 1534  HEPBSAG NON REACTIVE    Studies/Results: No results found.    LOS: 3 days   Jerene Bears, MD 08/09/2022, 2:32 PM See Shea Evans, [facility], to contact our on call provider

## 2022-08-09 NOTE — Anesthesia Preprocedure Evaluation (Signed)
Anesthesia Evaluation    Reviewed: Allergy & Precautions, Patient's Chart, lab work & pertinent test results  Airway Mallampati: II  TM Distance: >3 FB Neck ROM: Full    Dental no notable dental hx.    Pulmonary shortness of breath, pneumonia   Pulmonary exam normal breath sounds clear to auscultation       Cardiovascular hypertension, + CAD, + Past MI and + Cardiac Stents  Normal cardiovascular exam+ dysrhythmias Atrial Fibrillation  Rhythm:Regular Rate:Normal  07/2021 Echo Left ventricular ejection fraction, by estimation, is 30 to 35%. The  left ventricle has moderately decreased function. The left ventricle  demonstrates regional wall motion abnormalities (see scoring  diagram/findings for description). There is mild  left ventricular hypertrophy. Left ventricular diastolic parameters are  consistent with Grade II diastolic dysfunction (pseudonormalization).  Elevated left atrial pressure.     Neuro/Psych  PSYCHIATRIC DISORDERS Anxiety        GI/Hepatic PUD,GERD  ,,  Endo/Other  diabetes, Type 2    Renal/GU Dialysis and ESRFRenal diseaseLab Results      Component                Value               Date                      CREATININE               6.94 (H)            08/08/2022                BUN                      57 (H)              08/08/2022                NA                       135                 08/08/2022                K                        3.6                 08/08/2022                          Musculoskeletal  (+) Arthritis ,    Abdominal   Peds  Hematology  (+) Blood dyscrasia, anemia Lab Results      Component                Value               Date                              HGB                      8.7 (L)             08/09/2022                HCT  27.7 (L)            08/09/2022                       PLT                      282                 08/09/2022               Anesthesia Other Findings All: see list  Reproductive/Obstetrics                             Anesthesia Physical Anesthesia Plan  ASA: 4  Anesthesia Plan: MAC   Post-op Pain Management: Minimal or no pain anticipated   Induction: Intravenous  PONV Risk Score and Plan:   Airway Management Planned: Natural Airway and Nasal Cannula  Additional Equipment: None  Intra-op Plan:   Post-operative Plan:   Informed Consent:      Dental advisory given  Plan Discussed with:   Anesthesia Plan Comments: (EGD colon for Anemia)        Anesthesia Quick Evaluation

## 2022-08-09 NOTE — Progress Notes (Signed)
  [facility] Progress Note    Assessment/ Plan:   ESRD: -Outpatient orders: Javier Docker, MWF. 3.5hrs. Nipro. 300 BFR. 2k/2,5cal. TDC. Mircera 142mg (last dose 08/01/22). EDW 59.5kg -HD on MWF schedule.  Tolerated HD 1/29 with 3L net UF  Plan next HD Wed; left arm access has been thrombosed for 1-2 mths. She is catheter dependent for now.  FTT -per primary service. Palliative consulted: full scope of care   Volume/ hypertension: EDW 59.5kg. Will UF as tolerated, no cramping yest.   Anemia of Chronic Kidney Disease, symptomatic anemia: Hemoglobin 6.7 on presentation, transfuse PRN for hgb <7. Just received ESA as an outpatient, will redose accordingly. F/u cbc?   Secondary Hyperparathyroidism/Hyperphosphatemia: resume home binders   Subjective:   No acute events overnight. Denies f/c/n/v/sob. No cramping on dialysis yest.    Objective:   BP (!) 155/51 (BP Location: Right Arm)   Pulse (!) 59   Temp 98.2 F (36.8 C) (Oral)   Resp 17   Ht '5\' 2"'$  (1.575 m)   Wt 65.5 kg   SpO2 99%   BMI 26.41 kg/m   Intake/Output Summary (Last 24 hours) at 08/09/2022 [id number] data filed at 08/09/2022 0636 Gross per 24 hour  Intake 243 ml  Output 3000 ml  Net -2757 ml   Weight change:   Physical Exam: Gen: NAD CVS: RRR Resp: normal WOB Abd: soft, nt/nd Ext: 1+ pitting edema b/l Les Neuro: awake, interactive Dialysis access: Left chest TDC c/d/I, LUE AVG no bruit  Imaging: No results found.  Labs: BMET Recent Labs  Lab 08/06/22 0139 08/07/22 1148 08/08/22 0911  NA 138 133* 135  K 4.1 3.7 3.6  CL 99 96* 97*  CO2 '25 22 25  '$ GLUCOSE 113* 122* 127*  BUN 46* 36* 57*  CREATININE 7.30* 5.33* 6.94*  CALCIUM 9.2 9.0 8.3*  PHOS  --   --  4.8*   CBC Recent Labs  Lab 08/06/22 0139 08/07/22 1148 08/08/22 0859 08/09/22 0559  WBC 8.6 8.9 7.3 8.6  NEUTROABS 6.2  --   --   --   HGB 6.7* 9.0* 7.9* 8.7*  HCT 21.1* 27.6* 24.2* 27.7*  MCV 117.9* 107.8* 109.0*  109.5*  PLT 309 289 264 282    Medications:     (feeding supplement) PROSource Plus  30 mL Oral BID BM   sodium chloride   Intravenous Once   acetaminophen  500 mg Oral BID   amiodarone  100 mg Oral Daily   aspirin EC  81 mg Oral Daily   Chlorhexidine Gluconate Cloth  6 each Topical Q0600   diclofenac Sodium  2 g Topical BID   feeding supplement (NEPRO CARB STEADY)  237 mL Oral [id number]  folic acid  1 mg Oral Daily   heparin  5,000 Units Subcutaneous Q8H   insulin aspart  0-6 Units Subcutaneous TID WC   levothyroxine  50 mcg Oral QAC breakfast   metoprolol tartrate  12.5 mg Oral BID   multivitamin  1 tablet Oral Daily   omeprazole  20 mg Oral BID AC   rosuvastatin  10 mg Oral Daily   sevelamer carbonate  2,400 mg Oral TID with meals   sodium chloride flush  3 mL Intravenous Q12H      JOtelia Santee MD [facility] 08/09/2022, 8:22 AM

## 2022-08-09 NOTE — Care Management Important Message (Signed)
Important Message  Patient Details  Name: Shawna [name] MRN: [id number] Date of Birth: 16-Feb-1941   Medicare Important Message Given:  Yes     Orbie Pyo 08/09/2022, 2:56 PM

## 2022-08-10 DIAGNOSIS — R195 Other fecal abnormalities: Secondary | ICD-10-CM | POA: Diagnosis not present

## 2022-08-10 DIAGNOSIS — R627 Adult failure to thrive: Secondary | ICD-10-CM | POA: Diagnosis not present

## 2022-08-10 DIAGNOSIS — D649 Anemia, unspecified: Secondary | ICD-10-CM | POA: Diagnosis not present

## 2022-08-10 LAB — CBC
HCT: 25.5 % — ABNORMAL LOW (ref 36.0–46.0)
Hemoglobin: 8.5 g/dL — ABNORMAL LOW (ref 12.0–15.0)
MCH: 35.7 pg — ABNORMAL HIGH (ref 26.0–34.0)
MCHC: 33.3 g/dL (ref 30.0–36.0)
MCV: 107.1 fL — ABNORMAL HIGH (ref 80.0–100.0)
Platelets: 305 10*3/uL (ref 150–400)
RBC: 2.38 MIL/uL — ABNORMAL LOW (ref 3.87–5.11)
RDW: 21.9 % — ABNORMAL HIGH (ref 11.5–15.5)
WBC: 8.1 10*3/uL (ref 4.0–10.5)
nRBC: 0 % (ref 0.0–0.2)

## 2022-08-10 LAB — TYPE AND SCREEN
ABO/RH(D): O POS
Antibody Screen: NEGATIVE
Donor AG Type: NEGATIVE
Donor AG Type: NEGATIVE
Unit division: 0
Unit division: 0

## 2022-08-10 LAB — BASIC METABOLIC PANEL
Anion gap: 19 — ABNORMAL HIGH (ref 5–15)
BUN: 62 mg/dL — ABNORMAL HIGH (ref 8–23)
CO2: 23 mmol/L (ref 22–32)
Calcium: 9.3 mg/dL (ref 8.9–10.3)
Chloride: 95 mmol/L — ABNORMAL LOW (ref 98–111)
Creatinine, Ser: 6.32 mg/dL — ABNORMAL HIGH (ref 0.44–1.00)
GFR, Estimated: 6 mL/min — ABNORMAL LOW (ref >60)
Glucose, Bld: 91 mg/dL (ref 70–99)
Potassium: 4.6 mmol/L (ref 3.5–5.1)
Sodium: 137 mmol/L (ref 135–145)

## 2022-08-10 LAB — BPAM RBC
Blood Product Expiration Date: 202402282359
Blood Product Expiration Date: 202403012359
ISSUE DATE / TIME: 202401270607
Unit Type and Rh: 5100
Unit Type and Rh: 5100

## 2022-08-10 LAB — GLUCOSE, CAPILLARY
Glucose-Capillary: 99 mg/dL (ref 70–99)
Glucose-Capillary: 103 mg/dL — ABNORMAL HIGH (ref 70–99)
Glucose-Capillary: 99 mg/dL (ref 70–99)
Glucose-Capillary: 84 mg/dL (ref 70–99)

## 2022-08-10 LAB — AMMONIA: Ammonia: 31 umol/L (ref 9–35)

## 2022-08-10 MED ORDER — ACETAMINOPHEN 325 MG PO TABS: 650.0000 mg | ORAL_TABLET | Freq: Once | ORAL | Status: AC

## 2022-08-10 MED ORDER — PEG-KCL-NACL-NASULF-NA ASC-C 100 G PO SOLR: 0.5000 | Freq: Once | ORAL | Status: DC

## 2022-08-10 MED ORDER — ACETAMINOPHEN 325 MG PO TABS
ORAL_TABLET | ORAL | Status: AC
  Administered 2022-08-10: 650 mg via ORAL
  Filled 2022-08-10: qty 2

## 2022-08-10 MED ORDER — PEG-KCL-NACL-NASULF-NA ASC-C 100 G PO SOLR
0.5000 | Freq: Once | ORAL | Status: AC
  Administered 2022-08-10: 100 g via ORAL
  Filled 2022-08-10: qty 1

## 2022-08-10 MED ORDER — BISACODYL 5 MG PO TBEC
10.0000 mg | DELAYED_RELEASE_TABLET | Freq: Once | ORAL | Status: AC
  Administered 2022-08-10: 10 mg via ORAL
  Filled 2022-08-10: qty 2

## 2022-08-10 NOTE — Progress Notes (Signed)
PA Gribbin notified of current condition of bowel movement

## 2022-08-10 NOTE — Progress Notes (Addendum)
Daily Rounding Note  08/10/2022, 10:06 AM  LOS: 4 days   SUBJECTIVE:   Chief complaint:   Anemia, FOBT +   Completed bowel moviprep, had BM's but contistency of these ot reported to current RN.  HD session aborted due to tachy 120s-130s, hypotension.  GI procedures postponed to 0730 tomrw.   Pt feels well  OBJECTIVE:         Vital signs in last 24 hours:    Temp:  [97.6 F (36.4 C)-98.2 F (36.8 C)] 98.1 F (36.7 C) (01/31 0839) Pulse Rate:  [52-128] 125 (01/31 0839) Resp:  [16-34] 18 (01/31 0839) BP: (76-198)/(54-79) 120/73 (01/31 0839) SpO2:  [98 %-100 %] 100 % (01/31 0839) Weight:  [59 kg] 59 kg (01/31 0537) Last BM Date : 08/07/22 Filed Weights   08/07/22 0500 08/08/22 0418 08/10/22 0537  Weight: 64.7 kg 65.5 kg 59 kg   General: Alert, pleasant, no distress, not ill looking    Heart: NSR at 70 on monitor Chest: no dyspnea, lungs clear Abdomen: NT, ND.  Active BS  Rectal: empty vault.   Extremities: no CCE Neuro/Psych:  pleasantly confused.  Moves all 4 limbs.  No tremors.   Intake/Output from previous day: 01/30 0701 - 01/31 0700 In: 1140 [P.O.:1140] Out: 0   Intake/Output this shift: Total I/O In: -  Out: 1000 [Other:1000]  Lab Results: Recent Labs    08/08/22 0859 08/09/22 0559 08/10/22 0443  WBC 7.3 8.6 8.1  HGB 7.9* 8.7* 8.5*  HCT 24.2* 27.7* 25.5*  PLT 264 282 305   BMET Recent Labs    08/07/22 1148 08/08/22 0911 08/10/22 0443  NA 133* 135 137  K 3.7 3.6 4.6  CL 96* 97* 95*  CO2 '22 25 23  '$ GLUCOSE 122* 127* 91  BUN 36* 57* 62*  CREATININE 5.33* 6.94* 6.32*  CALCIUM 9.0 8.3* 9.3   LFT Recent Labs    08/08/22 0911  ALBUMIN 2.4*   PT/INR No results for input(s): "LABPROT", "INR" in the last 72 hours. Hepatitis Panel No results for input(s): "HEPBSAG", "HCVAB", "HEPAIGM", "HEPBIGM" in the last 72 hours.  Studies/Results: No results found.  Scheduled Meds:  (feeding  supplement) PROSource Plus  30 mL Oral BID BM   sodium chloride   Intravenous Once   acetaminophen  500 mg Oral BID   amiodarone  100 mg Oral Daily   aspirin EC  81 mg Oral Daily   Chlorhexidine Gluconate Cloth  6 each Topical Q0600   diclofenac Sodium  2 g Topical BID   feeding supplement (NEPRO CARB STEADY)  237 mL Oral H06C   folic acid  1 mg Oral Daily   heparin  5,000 Units Subcutaneous Q8H   insulin aspart  0-6 Units Subcutaneous TID WC   levothyroxine  75 mcg Oral QAC breakfast   metoprolol tartrate  12.5 mg Oral BID   multivitamin  1 tablet Oral Daily   omeprazole  20 mg Oral BID AC   rosuvastatin  10 mg Oral Daily   sevelamer carbonate  2,400 mg Oral TID with meals   sodium chloride flush  3 mL Intravenous Q12H   Continuous Infusions:  anticoagulant sodium citrate     PRN Meds:.alteplase, anticoagulant sodium citrate, calcium carbonate (dosed in mg elemental calcium), camphor-menthol **AND** hydrOXYzine, docusate sodium, fentaNYL (SUBLIMAZE) injection, heparin, naLOXone (NARCAN)  injection, ondansetron **OR** ondansetron (ZOFRAN) IV, mouth rinse, sorbitol   ASSESMENT:   Acute on chronic anemia, FOBT  positive stool without overt bleeding.  FTT.  Planned EGD, colonoscopy postponed another day, currently on for 730 tomorrow morning.  Yesterday's plan procedures canceled due to patient not drinking prep.  She drank prep yesterday but character of stools not known.  Hgb 6.7.Marland Kitchen  1 PRBC... 8.5 today.  Atrial fibrillation, not on chronic AC.  ESRD.  HD MWF.  Foreshortened dialysis today due to tachycardia.    PLAN   Wrote for additional movie prep to begin tonight, 1 L to start and if clear then does not require the second liter.  Drink second liter if not clear after first liter.  Give 10 mg Dulcolax now.  Will keep in touch with nurse regarding character of any stools today.  Hopefully will not have recurrent tachycardia or hypotension and can proceed to colonoscopy/EGD  tomorrow morning's.  Clears today, NPO after 0500.     Azucena Freed  08/10/2022, 10:06 AM Phone [phone]

## 2022-08-10 NOTE — Progress Notes (Signed)
Pt is confused, restless and agitated. Pt repeatedly gets out of bed. She is a high fall risk. Doctor on call notified. Recommend Order for restraints.

## 2022-08-10 NOTE — Progress Notes (Addendum)
RN received report  from dialysis that pt had to be removed early d/t tachycardia and low b/p. RN informed endo dept who will notified anesth. D/t pt instability. RN informed hospitalist of heart rate 120-130 sustained, scheduled metoprolol and amiodorone given.

## 2022-08-10 NOTE — Progress Notes (Signed)
  X-cover Note: Messaged by bedside RN about pt agitation. Pt continues to try and get out of bed. She is a high fall risk. Orders for restraints placed.   Kristopher Oppenheim, DO [facility]

## 2022-08-10 NOTE — Progress Notes (Signed)
RN at bedside with GI PA. Orders received to continue bowel prep tonight for colonoscopy tomorrow. RN received call from pt husband and he was updated with pt condition and rescheduled procedure. Pt heart rate stable.

## 2022-08-10 NOTE — Progress Notes (Signed)
PROGRESS NOTE    Shawna [name]  OEV:[id number] DOB: 07/11/1941 DOA: 08/06/2022 PCP: [facility]   Brief Narrative:  82 year old with past medical history significant for ESRD on hemodialysis MWF, CAD status post CABG, chronic systolic heart failure, hyperlipidemia, hypertension, A-fib presented with generalized weakness.  Her husband reports patient been complaining of severe headache around the ear and down the neck, difficulty turning her neck, difficulty walking.  Symptoms started about a week ago.  She had a couple of shots in her neck on 1/19 and she was given a pill for it on 1/25.  It is eased some  but is still.  Patient missed HD.   Patient was last admitted 12/1-3 for anemia associated with ABLA from her fistula, not on AC despite A-fib.  She returned 12/8 with cough treated with antibiotics with in the terminated chest x-ray.  She returned 1/19 with right-sided headache, diagnosed with occipital neuralgia and treated with nerve block.  Return 1/25 with neck pain attributed to the same.    Admitted with anemia, FTT, worsening confusion. Plan for endoscopy/colonoscopy.   Assessment & Plan:   Principal Problem:   Failure to thrive in adult Active Problems:   HLD (hyperlipidemia)   Hx of CABG   ESRD (end stage renal disease) on dialysis (HCC)   Anemia associated with chronic renal failure   Paroxysmal Atrial fibrillation/Flutter  1-FTT: -Patient with h/o ESRD, she has missed a couple of recent sessions due to progressive weakness, difficulty ambulating. -She has been seen in the ER twice recently (1/19 and 1/25) for occipital neuralgia -Anemia, received blood transfusion.  -Palliative care consulted for goals of care, full scope of treatment -PT OT eval, needs SNF   Encephalopathy acute on chronic.  Delirium  Family report confusion since December, worse over last week  Last week confuse was worse.  TSH 5.9---Will increase synthroid.  B 12; 1,981.  Ammonia  level normal.  Patient is fully alert but she is not oriented.  She appears to be at her baseline.   ESRD on HD: Hemodialysis MWF, nephrology managing.  Acute on chronic anemia of chronic disease:  Anemia of CKD, presented with hemoglobin 6.7, posttransfusion improved to 9.0 and has remained stable since last couple of days.  Occult blood positive.  GI consulted and patient is scheduled for EGD and colonoscopy today.   CAD:  Status post CABG Continue with aspirin and metoprolol.   HTN:  Blood pressure fairly stable, continue metoprolol and amiodarone.     A fib;  Her dialysis session was cut short today due to hypotension and tachycardia, she was in RVR, now she has been given her amiodarone as well as metoprolol and rates are controlled.  Patient has no symptoms. Not a candidate for [facility]   Hypothyroidism:  Continue with Synthroid   DM;  A1c: Continue with a sliding scale insulin   Headaches, Occipital neuralgia. Received nerve block Improved on schedule tylenol,  voltaren gel. , heating patch.   DVT prophylaxis: heparin injection 5,000 Units Start: 08/06/22 1300   Code Status: Full Code  Family Communication:  None present at bedside.  Status is: Inpatient Remains inpatient appropriate because: Scheduled for EGD and colonoscopy today.   Estimated body mass index is 23.79 kg/m as calculated from the following:   Height as of this encounter: '5\' 2"'$  (1.575 m).   Weight as of this encounter: 59 kg.    Nutritional Assessment: Body mass index is 23.79 kg/m.Marland Kitchen Seen by dietician.  I  agree with the assessment and plan as outlined below: Nutrition Status: Nutrition Problem: Increased nutrient needs Etiology: chronic illness Signs/Symptoms: estimated needs Interventions: Nepro shake, MVI, Prostat  . Skin Assessment: I have examined the patient's skin and I agree with the wound assessment as performed by the wound care RN as outlined below:    Consultants:  GI and  nephrology  Procedures:  As above  Antimicrobials:  Anti-infectives (From admission, onward)    None         Subjective: Patient seen and examined.  She is fully alert and oriented to person only.  She is very pleasant and very talkative.  Objective: Vitals:   08/10/22 0735 08/10/22 0749 08/10/22 0751 08/10/22 0839  BP: (!) 80/59 (!) 98/58 115/74 120/73  Pulse: (!) 128 (!) 120 (!) 121 (!) 125  Resp: 20 (!) 21 (!) 24 18  Temp:    98.1 F (36.7 C)  TempSrc:    Oral  SpO2: 98% 99% 98% 100%  Weight:      Height:        Intake/Output Summary (Last 24 hours) at 08/10/2022 0950 Last data filed at 08/10/2022 0751 Gross per 24 hour  Intake 1140 ml  Output 1000 ml  Net 140 ml   Filed Weights   08/07/22 0500 08/08/22 0418 08/10/22 0537  Weight: 64.7 kg 65.5 kg 59 kg    Examination:  General exam: Appears calm and comfortable  Respiratory system: Clear to auscultation. Respiratory effort normal. Cardiovascular system: S1 & S2 heard, RRR. No JVD, murmurs, rubs, gallops or clicks. No pedal edema. Gastrointestinal system: Abdomen is nondistended, soft and nontender. No organomegaly or masses felt. Normal bowel sounds heard. Central nervous system: Alert and oriented x 1. No focal neurological deficits. Extremities: Symmetric 5 x 5 power. Skin: No rashes, lesions or ulcers  Data Reviewed: I have personally reviewed following labs and imaging studies  CBC: Recent Labs  Lab 08/06/22 0139 08/07/22 1148 08/08/22 0859 08/09/22 0559 08/10/22 0443  WBC 8.6 8.9 7.3 8.6 8.1  NEUTROABS 6.2  --   --   --   --   HGB 6.7* 9.0* 7.9* 8.7* 8.5*  HCT 21.1* 27.6* 24.2* 27.7* 25.5*  MCV 117.9* 107.8* 109.0* 109.5* 107.1*  PLT 309 289 264 282 580   Basic Metabolic Panel: Recent Labs  Lab 08/06/22 0139 08/07/22 1148 08/08/22 0911 08/10/22 0443  NA 138 133* 135 137  K 4.1 3.7 3.6 4.6  CL 99 96* 97* 95*  CO2 '25 22 25 23  '$ GLUCOSE 113* 122* 127* 91  BUN 46* 36* 57* 62*   CREATININE 7.30* 5.33* 6.94* 6.32*  CALCIUM 9.2 9.0 8.3* 9.3  PHOS  --   --  4.8*  --    GFR: Estimated Creatinine Clearance: 5.4 mL/min (A) (by C-G formula based on SCr of 6.32 mg/dL (H)). Liver Function Tests: Recent Labs  Lab 08/06/22 0139 08/08/22 0911  AST 11*  --   ALT 16  --   ALKPHOS 99  --   BILITOT 0.5  --   PROT 6.5  --   ALBUMIN 3.0* 2.4*   No results for input(s): "LIPASE", "AMYLASE" in the last 168 hours. Recent Labs  Lab 08/10/22 0443  AMMONIA 31   Coagulation Profile: No results for input(s): "INR", "PROTIME" in the last 168 hours. Cardiac Enzymes: No results for input(s): "CKTOTAL", "CKMB", "CKMBINDEX", "TROPONINI" in the last 168 hours. BNP (last 3 results) No results for input(s): "PROBNP" in the last 8760 hours. HbA1C:  Recent Labs    08/07/22 1148  HGBA1C 5.5   CBG: Recent Labs  Lab 08/09/22 0719 08/09/22 1109 08/09/22 1640 08/09/22 2009 08/10/22 0834  GLUCAP 71 118* 97 104* 99   Lipid Profile: No results for input(s): "CHOL", "HDL", "LDLCALC", "TRIG", "CHOLHDL", "LDLDIRECT" in the last 72 hours. Thyroid Function Tests: Recent Labs    08/07/22 1148  TSH 5.919*   Anemia Panel: Recent Labs    08/07/22 1148  VITAMINB12 1,981*  FOLATE >40.0  FERRITIN 912*  TIBC 234*  IRON 63  RETICCTPCT 3.0   Sepsis Labs: No results for input(s): "PROCALCITON", "LATICACIDVEN" in the last 168 hours.  No results found for this or any previous visit (from the past 240 hour(s)).   Radiology Studies: No results found.  Scheduled Meds:  (feeding supplement) PROSource Plus  30 mL Oral BID BM   sodium chloride   Intravenous Once   acetaminophen  500 mg Oral BID   amiodarone  100 mg Oral Daily   aspirin EC  81 mg Oral Daily   Chlorhexidine Gluconate Cloth  6 each Topical Q0600   diclofenac Sodium  2 g Topical BID   feeding supplement (NEPRO CARB STEADY)  237 mL Oral N05L   folic acid  1 mg Oral Daily   heparin  5,000 Units Subcutaneous Q8H    insulin aspart  0-6 Units Subcutaneous TID WC   levothyroxine  75 mcg Oral QAC breakfast   metoprolol tartrate  12.5 mg Oral BID   multivitamin  1 tablet Oral Daily   omeprazole  20 mg Oral BID AC   rosuvastatin  10 mg Oral Daily   sevelamer carbonate  2,400 mg Oral TID with meals   sodium chloride flush  3 mL Intravenous Q12H   Continuous Infusions:  anticoagulant sodium citrate       LOS: 4 days   Darliss Cheney, MD [facility]  08/10/2022, 9:50 AM   *Please note that this is a verbal dictation therefore any spelling or grammatical errors are due to the "[place] [employer]" system interpretation.  Please page via [place] and do not message via secure chat for urgent patient care matters. Secure chat can be used for non urgent patient care matters.  How to contact the [facility] Attending or Consulting provider [place] or covering provider during after hours [place], for this patient?  Check the care team in [facility] and look for a) attending/consulting [facility] provider listed and b) the [facility] team listed. Page or secure chat [phone]. Log into [email or web address] and use [place] universal password to access. If you do not have the password, please contact the hospital operator. Locate the [facility] provider you are looking for under [facility] and page to a number that you can be directly reached. If you still have difficulty reaching the provider, please page the [facility] (Director on Call) for the Hospitalists listed on amion for assistance.

## 2022-08-10 NOTE — Progress Notes (Signed)
Pre HD Treatment   08/10/22 0537  Vitals  Temp 97.6 F (36.4 C)  Pulse Rate 61  Resp 17  BP (!) 189/67  SpO2 98 %  O2 Device Room Air  Weight 59 kg  Type of Weight Pre-Dialysis  Oxygen Therapy  Pulse Oximetry Type Continuous  Pre Treatment  Is pt a NEW START this admission?  No  Vascular access used during treatment Catheter  HD catheter dressing before treatment WDL  Patient is receiving dialysis in a chair No  Hemodialysis Consent Verified Yes  Hemodialysis Standing Orders Initiated Yes  ECG (Telemetry) Monitor On Yes  Prime Ordered Normal Saline  Length of  DialysisTreatment -hour(s) 3.5 Hour(s)  Dialysis mode HD  Dialyzer Revaclear 400  Dialysate 2K  Dialysate Flow Ordered 300  Ultrafiltration Goal 3000 Liters  Dialysis Blood Pressure Support Ordered Normal Saline

## 2022-08-10 NOTE — Progress Notes (Signed)
[facility] Progress Note    Assessment/ Plan:   ESRD: -Outpatient orders: Javier Docker, MWF. 3.5hrs. Nipro. 300 BFR. 2k/2,5cal. TDC. Mircera 137mg (last dose 08/01/22). EDW 59.5kg. Her left arm access has been thrombosed for 1-2 mths. She is catheter dependent for now; d/w her unit.  -HD on MWF schedule.  I elected to cut short her treatment today because of h/a and drop in BP, able to UF 1L and she's more stable now that we've taken her off. Volume status is adequate and shouldn't need dialysis till Fri.  FTT -per primary service. Palliative consulted: full scope of care   Volume/ hypertension: EDW 59.5kg. Will UF as tolerated.   Anemia of Chronic Kidney Disease, symptomatic anemia: Hemoglobin 6.7 on presentation, transfuse PRN for hgb <7. Hb 8.5 Just received ESA as an outpatient, will redose accordingly.    Secondary Hyperparathyroidism/Hyperphosphatemia: resume home binders   Subjective:   No acute events overnight. H/a on dialysis today but no blurry vision and feeling better after we took her off.    Objective:   BP 115/74   Pulse (!) 121   Temp 97.6 F (36.4 C)   Resp (!) 24   Ht '5\' 2"'$  (1.575 m)   Wt 59 kg   SpO2 98%   BMI 23.79 kg/m   Intake/Output Summary (Last 24 hours) at 08/10/2022 08676Last data filed at 08/10/2022 0518 Gross per 24 hour  Intake 1140 ml  Output 0 ml  Net 1140 ml   Weight change:   Physical Exam: Gen: NAD CVS: RRR Resp: normal WOB Abd: soft, nt/nd Ext: 1+ pitting edema b/l Les Neuro: awake, interactive Dialysis access: Left chest TDC c/d/I, LUE AVG no bruit  Imaging: No results found.  Labs: BMET Recent Labs  Lab 08/06/22 0139 08/07/22 1148 08/08/22 0911 08/10/22 0443  NA 138 133* 135 137  K 4.1 3.7 3.6 4.6  CL 99 96* 97* 95*  CO2 '25 22 25 23  '$ GLUCOSE 113* 122* 127* 91  BUN 46* 36* 57* 62*  CREATININE 7.30* 5.33* 6.94* 6.32*  CALCIUM 9.2 9.0 8.3* 9.3  PHOS  --   --  4.8*  --    CBC Recent Labs   Lab 08/06/22 0139 08/07/22 1148 08/08/22 0859 08/09/22 0559 08/10/22 0443  WBC 8.6 8.9 7.3 8.6 8.1  NEUTROABS 6.2  --   --   --   --   HGB 6.7* 9.0* 7.9* 8.7* 8.5*  HCT 21.1* 27.6* 24.2* 27.7* 25.5*  MCV 117.9* 107.8* 109.0* 109.5* 107.1*  PLT 309 289 264 282 305    Medications:     (feeding supplement) PROSource Plus  30 mL Oral BID BM   sodium chloride   Intravenous Once   acetaminophen  500 mg Oral BID   amiodarone  100 mg Oral Daily   aspirin EC  81 mg Oral Daily   Chlorhexidine Gluconate Cloth  6 each Topical Q0600   diclofenac Sodium  2 g Topical BID   feeding supplement (NEPRO CARB STEADY)  237 mL Oral QP95K  folic acid  1 mg Oral Daily   heparin  5,000 Units Subcutaneous Q8H   insulin aspart  0-6 Units Subcutaneous TID WC   levothyroxine  75 mcg Oral QAC breakfast   metoprolol tartrate  12.5 mg Oral BID   multivitamin  1 tablet Oral Daily   omeprazole  20 mg Oral BID AC   rosuvastatin  10 mg Oral Daily   sevelamer carbonate  2,400 mg  Oral TID with meals   sodium chloride flush  3 mL Intravenous Q12H      Otelia Santee, MD [facility] 08/10/2022, 8:21 AM

## 2022-08-10 NOTE — Procedures (Addendum)
HD Note:  Some information was entered later than the data was gathered due to patient care needs. The stated time with the data is accurate.  Informed consent signed and in chart.   Hand off of patient to this Probation officer at [employer]. Patient in bed with treatment ongoing.  At 0727, patient complained of headache and BP taken and SBP in the 70s, see flowsheet. UF turned off and a total of 300 ml was given prior to rinsing back per Dr. Augustin Coupe verbal order.  See flowsheet  Once patient was rinsed back her BP recovered to 115/74.  Report given to patient's nurse.   Access used: Left chest HD catheter  Access issues: None  Total UF removed: [facility]

## 2022-08-10 NOTE — H&P (View-Only) (Signed)
Daily Rounding Note  08/10/2022, 10:06 AM  LOS: 4 days   SUBJECTIVE:   Chief complaint:   Anemia, FOBT +   Completed bowel moviprep, had BM's but contistency of these ot reported to current RN.  HD session aborted due to tachy 120s-130s, hypotension.  GI procedures postponed to 0730 tomrw.   Pt feels well  OBJECTIVE:         Vital signs in last 24 hours:    Temp:  [97.6 F (36.4 C)-98.2 F (36.8 C)] 98.1 F (36.7 C) (01/31 0839) Pulse Rate:  [52-128] 125 (01/31 0839) Resp:  [16-34] 18 (01/31 0839) BP: (76-198)/(54-79) 120/73 (01/31 0839) SpO2:  [98 %-100 %] 100 % (01/31 0839) Weight:  [59 kg] 59 kg (01/31 0537) Last BM Date : 08/07/22 Filed Weights   08/07/22 0500 08/08/22 0418 08/10/22 0537  Weight: 64.7 kg 65.5 kg 59 kg   General: Alert, pleasant, no distress, not ill looking    Heart: NSR at 70 on monitor Chest: no dyspnea, lungs clear Abdomen: NT, ND.  Active BS  Rectal: empty vault.   Extremities: no CCE Neuro/Psych:  pleasantly confused.  Moves all 4 limbs.  No tremors.   Intake/Output from previous day: 01/30 0701 - 01/31 0700 In: 1140 [P.O.:1140] Out: 0   Intake/Output this shift: Total I/O In: -  Out: 1000 [Other:1000]  Lab Results: Recent Labs    08/08/22 0859 08/09/22 0559 08/10/22 0443  WBC 7.3 8.6 8.1  HGB 7.9* 8.7* 8.5*  HCT 24.2* 27.7* 25.5*  PLT 264 282 305   BMET Recent Labs    08/07/22 1148 08/08/22 0911 08/10/22 0443  NA 133* 135 137  K 3.7 3.6 4.6  CL 96* 97* 95*  CO2 '22 25 23  '$ GLUCOSE 122* 127* 91  BUN 36* 57* 62*  CREATININE 5.33* 6.94* 6.32*  CALCIUM 9.0 8.3* 9.3   LFT Recent Labs    08/08/22 0911  ALBUMIN 2.4*   PT/INR No results for input(s): "LABPROT", "INR" in the last 72 hours. Hepatitis Panel No results for input(s): "HEPBSAG", "HCVAB", "HEPAIGM", "HEPBIGM" in the last 72 hours.  Studies/Results: No results found.  Scheduled Meds:  (feeding  supplement) PROSource Plus  30 mL Oral BID BM   sodium chloride   Intravenous Once   acetaminophen  500 mg Oral BID   amiodarone  100 mg Oral Daily   aspirin EC  81 mg Oral Daily   Chlorhexidine Gluconate Cloth  6 each Topical Q0600   diclofenac Sodium  2 g Topical BID   feeding supplement (NEPRO CARB STEADY)  237 mL Oral B55H   folic acid  1 mg Oral Daily   heparin  5,000 Units Subcutaneous Q8H   insulin aspart  0-6 Units Subcutaneous TID WC   levothyroxine  75 mcg Oral QAC breakfast   metoprolol tartrate  12.5 mg Oral BID   multivitamin  1 tablet Oral Daily   omeprazole  20 mg Oral BID AC   rosuvastatin  10 mg Oral Daily   sevelamer carbonate  2,400 mg Oral TID with meals   sodium chloride flush  3 mL Intravenous Q12H   Continuous Infusions:  anticoagulant sodium citrate     PRN Meds:.alteplase, anticoagulant sodium citrate, calcium carbonate (dosed in mg elemental calcium), camphor-menthol **AND** hydrOXYzine, docusate sodium, fentaNYL (SUBLIMAZE) injection, heparin, naLOXone (NARCAN)  injection, ondansetron **OR** ondansetron (ZOFRAN) IV, mouth rinse, sorbitol   ASSESMENT:   Acute on chronic anemia, FOBT  positive stool without overt bleeding.  FTT.  Planned EGD, colonoscopy postponed another day, currently on for 730 tomorrow morning.  Yesterday's plan procedures canceled due to patient not drinking prep.  She drank prep yesterday but character of stools not known.  Hgb 6.7.Marland Kitchen  1 PRBC... 8.5 today.  Atrial fibrillation, not on chronic AC.  ESRD.  HD MWF.  Foreshortened dialysis today due to tachycardia.    PLAN   Wrote for additional movie prep to begin tonight, 1 L to start and if clear then does not require the second liter.  Drink second liter if not clear after first liter.  Give 10 mg Dulcolax now.  Will keep in touch with nurse regarding character of any stools today.  Hopefully will not have recurrent tachycardia or hypotension and can proceed to colonoscopy/EGD  tomorrow morning's.  Clears today, NPO after 0500.     Azucena Freed  08/10/2022, 10:06 AM Phone [phone]

## 2022-08-11 ENCOUNTER — Inpatient Hospital Stay (HOSPITAL_COMMUNITY): Payer: Medicare HMO | Admitting: Anesthesiology

## 2022-08-11 ENCOUNTER — Encounter (HOSPITAL_COMMUNITY)
Admission: EM | Disposition: A | Discharge status: Skilled Nursing Facility | Payer: Self-pay | Source: Home / Self Care | Attending: Family Medicine

## 2022-08-11 ENCOUNTER — Encounter (HOSPITAL_COMMUNITY): Payer: Self-pay | Admitting: Internal Medicine

## 2022-08-11 DIAGNOSIS — D132 Benign neoplasm of duodenum: Secondary | ICD-10-CM

## 2022-08-11 DIAGNOSIS — D125 Benign neoplasm of sigmoid colon: Secondary | ICD-10-CM

## 2022-08-11 DIAGNOSIS — K317 Polyp of stomach and duodenum: Secondary | ICD-10-CM

## 2022-08-11 DIAGNOSIS — K31819 Angiodysplasia of stomach and duodenum without bleeding: Secondary | ICD-10-CM | POA: Diagnosis not present

## 2022-08-11 DIAGNOSIS — D123 Benign neoplasm of transverse colon: Secondary | ICD-10-CM | POA: Diagnosis not present

## 2022-08-11 DIAGNOSIS — Z85038 Personal history of other malignant neoplasm of large intestine: Secondary | ICD-10-CM | POA: Diagnosis not present

## 2022-08-11 DIAGNOSIS — D124 Benign neoplasm of descending colon: Secondary | ICD-10-CM

## 2022-08-11 DIAGNOSIS — N186 End stage renal disease: Secondary | ICD-10-CM

## 2022-08-11 DIAGNOSIS — Z992 Dependence on renal dialysis: Secondary | ICD-10-CM

## 2022-08-11 DIAGNOSIS — Z08 Encounter for follow-up examination after completed treatment for malignant neoplasm: Secondary | ICD-10-CM

## 2022-08-11 DIAGNOSIS — I120 Hypertensive chronic kidney disease with stage 5 chronic kidney disease or end stage renal disease: Secondary | ICD-10-CM

## 2022-08-11 DIAGNOSIS — I2510 Atherosclerotic heart disease of native coronary artery without angina pectoris: Secondary | ICD-10-CM

## 2022-08-11 DIAGNOSIS — R627 Adult failure to thrive: Secondary | ICD-10-CM | POA: Diagnosis not present

## 2022-08-11 DIAGNOSIS — Z86010 Personal history of colonic polyps: Secondary | ICD-10-CM | POA: Diagnosis not present

## 2022-08-11 HISTORY — PX: HOT HEMOSTASIS: SHX5433

## 2022-08-11 HISTORY — PX: POLYPECTOMY: SHX5525

## 2022-08-11 HISTORY — PX: COLONOSCOPY: SHX5424

## 2022-08-11 HISTORY — PX: ESOPHAGOGASTRODUODENOSCOPY: SHX5428

## 2022-08-11 LAB — GLUCOSE, CAPILLARY
Glucose-Capillary: 66 mg/dL — ABNORMAL LOW (ref 70–99)
Glucose-Capillary: 98 mg/dL (ref 70–99)
Glucose-Capillary: 98 mg/dL (ref 70–99)
Glucose-Capillary: 123 mg/dL — ABNORMAL HIGH (ref 70–99)

## 2022-08-11 SURGERY — EGD (ESOPHAGOGASTRODUODENOSCOPY)
Anesthesia: Monitor Anesthesia Care

## 2022-08-11 MED ORDER — PHENYLEPHRINE 80 MCG/ML (10ML) SYRINGE FOR IV PUSH (FOR BLOOD PRESSURE SUPPORT)
PREFILLED_SYRINGE | INTRAVENOUS | Status: DC | PRN
  Administered 2022-08-11: 160 ug via INTRAVENOUS

## 2022-08-11 MED ORDER — PROMETHAZINE HCL 25 MG/ML IJ SOLN: 6.2500 mg | INTRAMUSCULAR | Status: DC | PRN

## 2022-08-11 MED ORDER — PROPOFOL 500 MG/50ML IV EMUL
INTRAVENOUS | Status: DC | PRN
  Administered 2022-08-11: 100 ug/kg/min via INTRAVENOUS

## 2022-08-11 MED ORDER — HYDROMORPHONE HCL 1 MG/ML IJ SOLN: 0.2500 mg | INTRAMUSCULAR | Status: DC | PRN

## 2022-08-11 MED ORDER — PROPOFOL 10 MG/ML IV BOLUS
INTRAVENOUS | Status: DC | PRN
  Administered 2022-08-11 (×2): 20 mg via INTRAVENOUS

## 2022-08-11 MED ORDER — OXYCODONE HCL 5 MG/5ML PO SOLN: 5.0000 mg | Freq: Once | ORAL | Status: DC | PRN

## 2022-08-11 MED ORDER — GLUCAGON HCL RDNA (DIAGNOSTIC) 1 MG IJ SOLR
INTRAMUSCULAR | Status: DC | PRN
  Administered 2022-08-11: .5 mg via INTRAVENOUS

## 2022-08-11 MED ORDER — SODIUM CHLORIDE 0.9 % IV SOLN: INTRAVENOUS | Status: DC | PRN

## 2022-08-11 MED ORDER — AMISULPRIDE (ANTIEMETIC) 5 MG/2ML IV SOLN: 10.0000 mg | Freq: Once | INTRAVENOUS | Status: DC | PRN

## 2022-08-11 MED ORDER — LIDOCAINE 2% (20 MG/ML) 5 ML SYRINGE
INTRAMUSCULAR | Status: DC | PRN
  Administered 2022-08-11: 100 mg via INTRAVENOUS

## 2022-08-11 MED ORDER — OXYCODONE HCL 5 MG PO TABS: 5.0000 mg | ORAL_TABLET | Freq: Once | ORAL | Status: DC | PRN

## 2022-08-11 NOTE — Interval H&P Note (Signed)
History and Physical Interval Note:  For upper endoscopy and attempted colonoscopy (patient has drank prep over 2+ days but never completely finished any full of the prep at each setting).   The nature of the procedure, as well as the risks, benefits, and alternatives were carefully and thoroughly reviewed with the patient and her husband by phone.  Ample time for discussion and questions allowed. The patient and family understood, were satisfied, and agreed to proceed.      Latest Ref Rng & Units 08/10/2022    4:43 AM 08/09/2022    5:59 AM 08/08/2022    8:59 AM  CBC  WBC 4.0 - 10.5 K/uL 8.1  8.6  7.3   Hemoglobin 12.0 - 15.0 g/dL 8.5  8.7  7.9   Hematocrit 36.0 - 46.0 % 25.5  27.7  24.2   Platelets 150 - 400 K/uL 305  282  264      08/11/2022 8:18 AM  Shawna [name]  has presented today for surgery, with the diagnosis of anemia.  The various methods of treatment have been discussed with the patient and family. After consideration of risks, benefits and other options for treatment, the patient has consented to  Procedure(s): ESOPHAGOGASTRODUODENOSCOPY (EGD) (N/A) COLONOSCOPY (N/A) as a surgical intervention.  The patient's history has been reviewed, patient examined, no change in status, stable for surgery.  I have reviewed the patient's chart and labs.  Questions were answered to the patient's satisfaction.     Lajuan Lines Demari Gales

## 2022-08-11 NOTE — Progress Notes (Signed)
Patient refuses to drink her Moviprep last night for her procedure this morning despite several reminders and education sessions. Doctor on call is notified.

## 2022-08-11 NOTE — Op Note (Signed)
[facility] Patient Name: Shawna [name] Procedure Date : 08/11/2022 MRN: [id number] Attending MD: Jerene Bears , MD, [id number] Date of Birth: [birth year] CSN: [id number] Age: 82 Admit Type: Inpatient Procedure:                Upper GI endoscopy Indications:              Acute post hemorrhagic anemia (on chronic anemia),                            Heme positive stool, personal hx of duodenal                            adenoma, personal hx of colon cancer Providers:                Lajuan Lines. Hilarie Fredrickson, MD, Jaci Carrel, RN, Brien Mates, Technician Referring MD:             [facility] Medicines:                Monitored Anesthesia Care Complications:            No immediate complications. Estimated Blood Loss:     Estimated blood loss: none. Procedure:                Pre-Anesthesia Assessment:                           - Prior to the procedure, a History and Physical                            was performed, and patient medications and                            allergies were reviewed. The patient's tolerance of                            previous anesthesia was also reviewed. The risks                            and benefits of the procedure and the sedation                            options and risks were discussed with the patient.                            All questions were answered, and informed consent                            was obtained. Prior Anticoagulants: The patient has                            taken no anticoagulant or antiplatelet agents. ASA  Grade Assessment: III - A patient with severe                            systemic disease. After reviewing the risks and                            benefits, the patient was deemed in satisfactory                            condition to undergo the procedure.                           After obtaining informed consent, the endoscope was                             passed under direct vision. Throughout the                            procedure, the patient's blood pressure, pulse, and                            oxygen saturations were monitored continuously. The                            [id number]-H190 ([id number]) Olympus endoscope was introduced                            through the mouth, and advanced to the third part                            of duodenum. The upper GI endoscopy was                            accomplished without difficulty. The patient                            tolerated the procedure well. Scope In: Scope Out: Findings:      The examined esophagus was normal.      A 2 cm hiatal hernia was present.      The entire examined stomach was normal.      A single 3 mm angioectasia with typical arborization was found in the       second portion of the duodenum. Fulguration to ablate the lesion to       prevent bleeding by argon plasma at 0.5 liters/minute and 20 watts was       successful.      A single 5 mm sessile polyp with no bleeding was found in the second       portion of the duodenum. The polyp was removed with a cold snare.       Resection and retrieval were complete.      The exam of the duodenum was otherwise normal. Impression:               - Normal esophagus.                           -  2 cm hiatal hernia.                           - Normal stomach.                           - A single angioectasia in the duodenum. Treated                            with argon plasma coagulation (APC).                           - A single duodenal polyp. Resected and retrieved. Moderate Sedation:      N/A Recommendation:           - Return patient to hospital ward for ongoing care.                           - Resume previous diet.                           - Continue present medications.                           - Await pathology results.                           - See the other procedure note for documentation  of                            additional recommendations. Procedure Code(s):        --- Professional ---                           [phone], 59, Esophagogastroduodenoscopy, flexible,                            transoral; with control of bleeding, any method                           43251, Esophagogastroduodenoscopy, flexible,                            transoral; with removal of tumor(s), polyp(s), or                            other lesion(s) by snare technique Diagnosis Code(s):        --- Professional ---                           K44.9, Diaphragmatic hernia without obstruction or                            gangrene                           K31.819, Angiodysplasia of stomach and duodenum  without bleeding                           K31.7, Polyp of stomach and duodenum                           D62, Acute posthemorrhagic anemia                           R19.5, Other fecal abnormalities CPT copyright 2022 American Medical Association. All rights reserved. The codes documented in this report are preliminary and upon coder review may  be revised to meet current compliance requirements. Jerene Bears, MD 08/11/2022 9:07:29 AM This report has been signed electronically. Number of Addenda: 0

## 2022-08-11 NOTE — Progress Notes (Signed)
PT Cancellation Note  Patient Details Name: Shawna [name] MRN: [id number] DOB: [birth year]   Cancelled Treatment:    Reason Eval/Treat Not Completed: Patient at procedure or test/unavailable  Off unit for procedure. Will check back this afternoon as schedule allows, otherwise may need to follow-up tomorrow.  Candie Mile, PT, DPT Physical Therapist Acute Rehabilitation Services [facility]   [name] 08/11/2022, 8:38 AM

## 2022-08-11 NOTE — Progress Notes (Signed)
Brief Palliative Medicine Progress Note:  PMT planned to follow up with patient and her husband today 2/1 after endoscopic evaluations; however, per chart review noted patient did not go for procedure yesterday due to inability to complete bowel prep for the second day. Patient is scheduled for procedures today - PMT will follow up tomorrow 2/2.   Thank you for allowing PMT to assist in the care of this patient.  Bridgitt Raggio M. Tamala [facility] Palliative Medicine Team Team Phone: [phone] NO CHARGE

## 2022-08-11 NOTE — Progress Notes (Signed)
Pt in procedure from 8:30- 10:15, no assessment on restraints done at this time by this RN

## 2022-08-11 NOTE — Transfer of Care (Signed)
Immediate Anesthesia Transfer of Care Note  Patient: KERYL GHOLSON  Procedure(s) Performed: ESOPHAGOGASTRODUODENOSCOPY (EGD) COLONOSCOPY POLYPECTOMY HOT HEMOSTASIS (ARGON PLASMA COAGULATION/BICAP)  Patient Location: PACU  Anesthesia Type:MAC  Level of Consciousness: drowsy  Airway & Oxygen Therapy: Patient Spontanous Breathing and Patient connected to nasal cannula oxygen  Post-op Assessment: Report given to RN and Post -op Vital signs reviewed and stable  Post vital signs: Reviewed and stable  Last Vitals:  Vitals Value Taken Time  BP 124/74   Temp    Pulse 61 08/11/22 0955  Resp 22 08/11/22 0955  SpO2 96 % 08/11/22 0955  Vitals shown include unvalidated device data.  Last Pain:  Vitals:   08/11/22 0734  TempSrc: Temporal  PainSc: 0-No pain      Patients Stated Pain Goal: 2 (74/82/70 7867)  Complications: No notable events documented.

## 2022-08-11 NOTE — Progress Notes (Signed)
[facility] Progress Note    Assessment/ Plan:   ESRD: -Outpatient orders: Javier Docker, MWF. 3.5hrs. Nipro. 300 BFR. 2k/2,5cal. TDC. Mircera 184mg (last dose 08/01/22). EDW 59.5kg. Her left arm access has been thrombosed for 1-2 mths. She is catheter dependent for now; d/w her unit.  -HD on MWF schedule. Had to cut short her treatment 1/31 because of h/a and drop in BP, able to UF 1L. Plan on next HD Fri on MWF regimen.  FTT -per primary service. Palliative consulted: full scope of care   Volume/ hypertension: EDW 59.5kg. Will UF as tolerated.   Anemia of Chronic Kidney Disease, symptomatic anemia: Hemoglobin 6.7 on presentation, transfuse PRN for hgb <7. Hb 8.5 Just received ESA as an outpatient, will redose accordingly.    Secondary Hyperparathyroidism/Hyperphosphatemia: resume home binders   Subjective:   No acute events overnight. S/p scope 2/1 and tolerated but c/o back pain.    Objective:   BP (!) 143/77   Pulse (!) 111   Temp (!) 97.4 F (36.3 C) (Axillary)   Resp 16   Ht '5\' 2"'$  (1.575 m)   Wt 59 kg   SpO2 91%   BMI 23.79 kg/m   Intake/Output Summary (Last 24 hours) at 08/11/2022 1121 Last data filed at 08/11/2022 0945 Gross per 24 hour  Intake 520 ml  Output 0 ml  Net 520 ml   Weight change:   Physical Exam: Gen: NAD CVS: RRR Resp: normal WOB Abd: soft, nt/nd Ext: tr pitting edema b/l Les Neuro: awake, interactive Dialysis access: Left chest TDC c/d/I, LUE AVG no bruit  Imaging: No results found.  Labs: BMET Recent Labs  Lab 08/06/22 0139 08/07/22 1148 08/08/22 0911 08/10/22 0443  NA 138 133* 135 137  K 4.1 3.7 3.6 4.6  CL 99 96* 97* 95*  CO2 '25 22 25 23  '$ GLUCOSE 113* 122* 127* 91  BUN 46* 36* 57* 62*  CREATININE 7.30* 5.33* 6.94* 6.32*  CALCIUM 9.2 9.0 8.3* 9.3  PHOS  --   --  4.8*  --    CBC Recent Labs  Lab 08/06/22 0139 08/07/22 1148 08/08/22 0859 08/09/22 0559 08/10/22 0443  WBC 8.6 8.9 7.3 8.6 8.1  NEUTROABS  6.2  --   --   --   --   HGB 6.7* 9.0* 7.9* 8.7* 8.5*  HCT 21.1* 27.6* 24.2* 27.7* 25.5*  MCV 117.9* 107.8* 109.0* 109.5* 107.1*  PLT 309 289 264 282 305    Medications:     (feeding supplement) PROSource Plus  30 mL Oral BID BM   sodium chloride   Intravenous Once   acetaminophen  500 mg Oral BID   amiodarone  100 mg Oral Daily   aspirin EC  81 mg Oral Daily   Chlorhexidine Gluconate Cloth  6 each Topical Q0600   diclofenac Sodium  2 g Topical BID   feeding supplement (NEPRO CARB STEADY)  237 mL Oral QN23F  folic acid  1 mg Oral Daily   heparin  5,000 Units Subcutaneous Q8H   insulin aspart  0-6 Units Subcutaneous TID WC   levothyroxine  75 mcg Oral QAC breakfast   metoprolol tartrate  12.5 mg Oral BID   multivitamin  1 tablet Oral Daily   omeprazole  20 mg Oral BID AC   peg 3350 powder  0.5 kit Oral Once   rosuvastatin  10 mg Oral Daily   sevelamer carbonate  2,400 mg Oral TID with meals   sodium chloride flush  3 mL Intravenous Q12H      Otelia Santee, MD [facility] 08/11/2022, 11:21 AM

## 2022-08-11 NOTE — Anesthesia Postprocedure Evaluation (Signed)
Anesthesia Post Note  Patient: KYNA BLAHNIK  Procedure(s) Performed: ESOPHAGOGASTRODUODENOSCOPY (EGD) COLONOSCOPY POLYPECTOMY HOT HEMOSTASIS (ARGON PLASMA COAGULATION/BICAP)     Patient location during evaluation: PACU Anesthesia Type: MAC Level of consciousness: awake and alert Pain management: pain level controlled Vital Signs Assessment: post-procedure vital signs reviewed and stable Respiratory status: spontaneous breathing, nonlabored ventilation and respiratory function stable Cardiovascular status: blood pressure returned to baseline and stable Postop Assessment: no apparent nausea or vomiting Anesthetic complications: no   No notable events documented.  Last Vitals:  Vitals:   08/11/22 1015 08/11/22 1037  BP: (!) 153/56 (!) 143/77  Pulse: 67 (!) 111  Resp: 17 16  Temp:  (!) 36.3 C  SpO2: 99% 91%    Last Pain:  Vitals:   08/11/22 1037  TempSrc: Axillary  PainSc:                  Lynda Rainwater

## 2022-08-11 NOTE — Op Note (Signed)
[facility] Patient Name: Shawna [name] Procedure Date : 08/11/2022 MRN: [id number] Attending MD: Jerene Bears , MD, [id number] Date of Birth: 12/25/1940 CSN: [id number] Age: 82 Admit Type: Inpatient Procedure:                Colonoscopy Indications:              Heme positive stool, Acute on chronic anemia felt                            secondary blood-loss anemia Providers:                Lajuan Lines. Hilarie Fredrickson, MD, Jaci Carrel, RN, Brien Mates, Technician Referring MD:             [facility] Medicines:                Monitored Anesthesia Care Complications:            No immediate complications. Estimated Blood Loss:     Estimated blood loss was minimal. Procedure:                Pre-Anesthesia Assessment:                           - Prior to the procedure, a History and Physical                            was performed, and patient medications and                            allergies were reviewed. The patient's tolerance of                            previous anesthesia was also reviewed. The risks                            and benefits of the procedure and the sedation                            options and risks were discussed with the patient.                            All questions were answered, and informed consent                            was obtained. Prior Anticoagulants: The patient has                            taken no anticoagulant or antiplatelet agents. ASA                            Grade Assessment: III - A patient with severe  systemic disease. After reviewing the risks and                            benefits, the patient was deemed in satisfactory                            condition to undergo the procedure.                           After obtaining informed consent, the colonoscope                            was passed under direct vision. Throughout the                             procedure, the patient's blood pressure, pulse, and                            oxygen saturations were monitored continuously. The                            PCF-HQ190TL ([id number] Olympus peds colonoscope was                            introduced through the anus and advanced to the                            terminal ileum. The colonoscopy was performed                            without difficulty. The patient tolerated the                            procedure well. The quality of the bowel                            preparation was fair. The terminal ileum and the                            rectum were photographed. Scope In: 9:12:55 AM Scope Out: 9:37:09 AM Scope Withdrawal Time: 0 hours 20 minutes 3 seconds  Total Procedure Duration: 0 hours 24 minutes 14 seconds  Findings:      The digital rectal exam was normal. Pertinent negatives include no       palpable rectal lesions.      There was evidence of a prior end-to-end ileo-colonic anastomosis in the       ascending colon and a end-to-side colocolonic anastomosis in the       rectosigmoid colon. These were patent and was characterized by healthy       appearing mucosa. The anastomoses were traversed.      The neo-terminal ileum appeared normal.      Two sessile polyps were found in the transverse colon. The polyps were 5       to 6 mm in size. These polyps were removed with a cold snare. Resection  and retrieval were complete.      Three sessile polyps were found in the descending colon. The polyps were       4 to 7 mm in size. These polyps were removed with a cold snare.       Resection and retrieval were complete.      A 9 mm polyp was found in the sigmoid colon. The polyp was sessile. The       polyp was removed with a cold snare. Resection and retrieval were       complete.      Multiple large-mouthed and small-mouthed diverticula were found in the       sigmoid colon and descending colon.      Retroflexion in the  rectum was not performed due to post-surgical       anatomy. Impression:               - Preparation of the colon was fair.                           - Patent end-to-end ileo-colonic and end to side                            colocolonic anastomoses, characterized by healthy                            appearing mucosa.                           - The examined portion of the neo-terminal ileum                            was normal.                           - Two 5 to 6 mm polyps in the transverse colon,                            removed with a cold snare. Resected and retrieved.                           - Three 4 to 7 mm polyps in the descending colon,                            removed with a cold snare. Resected and retrieved.                           - One 9 mm polyp in the sigmoid colon, removed with                            a cold snare. Resected and retrieved.                           - Moderate diverticulosis in the sigmoid colon and                            in  the descending colon. Moderate Sedation:      N/A Recommendation:           - Return patient to hospital ward for ongoing care.                           - Resume previous diet.                           - Continue present medications.                           - Await pathology results.                           - Monitor Hgb. Iron replacement per nephrology.                           - No repeat colonoscopy due to age. Procedure Code(s):        --- Professional ---                           [id number], Colonoscopy, flexible; with removal of                            tumor(s), polyp(s), or other lesion(s) by snare                            technique Diagnosis Code(s):        --- Professional ---                           Z98.0, Intestinal bypass and anastomosis status                           D12.3, Benign neoplasm of transverse colon (hepatic                            flexure or splenic flexure)                            D12.4, Benign neoplasm of descending colon                           D12.5, Benign neoplasm of sigmoid colon                           R19.5, Other fecal abnormalities                           D62, Acute posthemorrhagic anemia                           K57.30, Diverticulosis of large intestine without                            perforation or abscess without bleeding CPT copyright 2022 American Medical Association. All rights reserved. The  codes documented in this report are preliminary and upon coder review may  be revised to meet current compliance requirements. Jerene Bears, MD 08/11/2022 10:01:54 AM This report has been signed electronically. Number of Addenda: 0

## 2022-08-11 NOTE — Procedures (Addendum)
Spoke to the patient's husband by phone after procedures today. I updated him on the endoscopic findings and treatment Time provided for questions and answers and he thanked me for the call

## 2022-08-11 NOTE — Progress Notes (Signed)
PROGRESS NOTE    Shawna [name]  JQB:[id number] DOB: 03-01-1941 DOA: 08/06/2022 PCP: [facility]   Brief Narrative:  82 year old with past medical history significant for ESRD on hemodialysis MWF, CAD status post CABG, chronic systolic heart failure, hyperlipidemia, hypertension, A-fib presented with generalized weakness.  Her husband reports patient been complaining of severe headache around the ear and down the neck, difficulty turning her neck, difficulty walking.  Symptoms started about a week ago.  She had a couple of shots in her neck on 1/19 and she was given a pill for it on 1/25.  It is eased some  but is still.  Patient missed HD.   Patient was last admitted 12/1-3 for anemia associated with ABLA from her fistula, not on AC despite A-fib.  She returned 12/8 with cough treated with antibiotics with in the terminated chest x-ray.  She returned 1/19 with right-sided headache, diagnosed with occipital neuralgia and treated with nerve block.  Return 1/25 with neck pain attributed to the same.    Admitted with anemia, FTT, worsening confusion. Plan for endoscopy/colonoscopy.   Assessment & Plan:   Principal Problem:   Failure to thrive in adult Active Problems:   HLD (hyperlipidemia)   Hx of CABG   ESRD (end stage renal disease) on dialysis (HCC)   Anemia associated with chronic renal failure   Paroxysmal Atrial fibrillation/Flutter   Benign neoplasm of transverse colon   Benign neoplasm of descending colon   Benign neoplasm of sigmoid colon   Angiodysplasia of duodenum  1-FTT: -Patient with h/o ESRD, she has missed a couple of recent sessions due to progressive weakness, difficulty ambulating. -She has been seen in the ER twice recently (1/19 and 1/25) for occipital neuralgia -Anemia, received blood transfusion.  -Palliative care consulted for goals of care, full scope of treatment -PT OT eval, needs SNF   Encephalopathy acute on chronic.  Delirium  Family  report confusion since December, worse over last week  Last week confuse was worse.  TSH 5.9---Will increase synthroid.  B 12; 1,981.  Ammonia level normal.  Patient is fully alert but she is not oriented.  She appears to be at her baseline.   ESRD on HD: Hemodialysis MWF, nephrology managing.  Acute on chronic anemia of chronic disease:  Anemia of CKD, presented with hemoglobin 6.7, posttransfusion improved to 9.0 and has remained stable since last couple of days.  Occult blood positive.  GI consulted, s/p EGD and colonoscopy 08/10/2022, duodenal and colonic polyps which were removed. 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    CAD:  Status post CABG Continue with aspirin and metoprolol.   HTN:  Blood pressure fairly stable, continue metoprolol and amiodarone.     A fib;  Her dialysis session was cut short today due to hypotension and tachycardia, she was in RVR, now she has been given her amiodarone as well as metoprolol and rates are controlled.  Patient has no symptoms. Not a candidate for [facility]   Hypothyroidism:  Continue with Synthroid   DM;  A1c: Continue with a sliding scale insulin   Headaches, Occipital neuralgia. Received nerve block Improved on schedule tylenol,  voltaren gel. , heating patch.   DVT prophylaxis: heparin injection 5,000 Units Start: 08/06/22 1300   Code Status: Full Code  Family Communication: 2 sisters, husband and another family member present at bedside.  Status is: Inpatient Remains inpatient appropriate because: Now medically stable, pending placement.   Estimated body mass index is 23.79 kg/m as calculated from  the following:   Height as of this encounter: '5\' 2"'$  (1.575 m).   Weight as of this encounter: 59 kg.    Nutritional Assessment: Body mass index is 23.79 kg/m.Marland Kitchen Seen by dietician.  I agree with the assessment and plan as outlined below: Nutrition Status: Nutrition Problem: Increased nutrient needs Etiology: chronic illness Signs/Symptoms:  estimated needs Interventions: Nepro shake, MVI, Prostat  . Skin Assessment: I have examined the patient's skin and I agree with the wound assessment as performed by the wound care RN as outlined below:    Consultants:  GI and nephrology  Procedures:  As above  Antimicrobials:  Anti-infectives (From admission, onward)    None         Subjective:  Patient seen and examined.  She has no complaints.  She is alert and oriented to self and place.  Objective: Vitals:   08/11/22 0734 08/11/22 1000 08/11/22 1015 08/11/22 1037  BP: (!) 188/76 (!) 119/46 (!) 153/56 (!) 143/77  Pulse: 70 66 67 (!) 111  Resp: '20 13 17 16  '$ Temp: (!) 97.2 F (36.2 C) 97.6 F (36.4 C)  (!) 97.4 F (36.3 C)  TempSrc: Temporal   Axillary  SpO2: 96% 97% 99% 91%  Weight: 59 kg     Height: '5\' 2"'$  (1.575 m)       Intake/Output Summary (Last 24 hours) at 08/11/2022 1533 Last data filed at 08/11/2022 1239 Gross per 24 hour  Intake 390 ml  Output 0 ml  Net 390 ml    Filed Weights   08/08/22 0418 08/10/22 0537 08/11/22 0734  Weight: 65.5 kg 59 kg 59 kg    Examination:  General exam: Appears calm and comfortable  Respiratory system: Clear to auscultation. Respiratory effort normal. Cardiovascular system: S1 & S2 heard, RRR. No JVD, murmurs, rubs, gallops or clicks. No pedal edema. Gastrointestinal system: Abdomen is nondistended, soft and nontender. No organomegaly or masses felt. Normal bowel sounds heard. Central nervous system: Alert and orientedx2. No focal neurological deficits. Extremities: Symmetric 5 x 5 power. Skin: No rashes, lesions or ulcers.    Data Reviewed: I have personally reviewed following labs and imaging studies  CBC: Recent Labs  Lab 08/06/22 0139 08/07/22 1148 08/08/22 0859 08/09/22 0559 08/10/22 0443  WBC 8.6 8.9 7.3 8.6 8.1  NEUTROABS 6.2  --   --   --   --   HGB 6.7* 9.0* 7.9* 8.7* 8.5*  HCT 21.1* 27.6* 24.2* 27.7* 25.5*  MCV 117.9* 107.8* 109.0* 109.5*  107.1*  PLT 309 289 264 282 458    Basic Metabolic Panel: Recent Labs  Lab 08/06/22 0139 08/07/22 1148 08/08/22 0911 08/10/22 0443  NA 138 133* 135 137  K 4.1 3.7 3.6 4.6  CL 99 96* 97* 95*  CO2 '25 22 25 23  '$ GLUCOSE 113* 122* 127* 91  BUN 46* 36* 57* 62*  CREATININE 7.30* 5.33* 6.94* 6.32*  CALCIUM 9.2 9.0 8.3* 9.3  PHOS  --   --  4.8*  --     GFR: Estimated Creatinine Clearance: 5.4 mL/min (A) (by C-G formula based on SCr of 6.32 mg/dL (H)). Liver Function Tests: Recent Labs  Lab 08/06/22 0139 08/08/22 0911  AST 11*  --   ALT 16  --   ALKPHOS 99  --   BILITOT 0.5  --   PROT 6.5  --   ALBUMIN 3.0* 2.4*    No results for input(s): "LIPASE", "AMYLASE" in the last 168 hours. Recent Labs  Lab 08/10/22  0443  AMMONIA 31    Coagulation Profile: No results for input(s): "INR", "PROTIME" in the last 168 hours. Cardiac Enzymes: No results for input(s): "CKTOTAL", "CKMB", "CKMBINDEX", "TROPONINI" in the last 168 hours. BNP (last 3 results) No results for input(s): "PROBNP" in the last 8760 hours. HbA1C: No results for input(s): "HGBA1C" in the last 72 hours.  CBG: Recent Labs  Lab 08/10/22 1108 08/10/22 1642 08/10/22 2109 08/11/22 1140 08/11/22 1236  GLUCAP 103* 99 84 66* 98    Lipid Profile: No results for input(s): "CHOL", "HDL", "LDLCALC", "TRIG", "CHOLHDL", "LDLDIRECT" in the last 72 hours. Thyroid Function Tests: No results for input(s): "TSH", "T4TOTAL", "FREET4", "T3FREE", "THYROIDAB" in the last 72 hours.  Anemia Panel: No results for input(s): "VITAMINB12", "FOLATE", "FERRITIN", "TIBC", "IRON", "RETICCTPCT" in the last 72 hours.  Sepsis Labs: No results for input(s): "PROCALCITON", "LATICACIDVEN" in the last 168 hours.  No results found for this or any previous visit (from the past 240 hour(s)).   Radiology Studies: No results found.  Scheduled Meds:  (feeding supplement) PROSource Plus  30 mL Oral BID BM   sodium chloride   Intravenous  Once   acetaminophen  500 mg Oral BID   amiodarone  100 mg Oral Daily   aspirin EC  81 mg Oral Daily   Chlorhexidine Gluconate Cloth  6 each Topical Q0600   diclofenac Sodium  2 g Topical BID   feeding supplement (NEPRO CARB STEADY)  237 mL Oral Z61W   folic acid  1 mg Oral Daily   heparin  5,000 Units Subcutaneous Q8H   insulin aspart  0-6 Units Subcutaneous TID WC   levothyroxine  75 mcg Oral QAC breakfast   metoprolol tartrate  12.5 mg Oral BID   multivitamin  1 tablet Oral Daily   omeprazole  20 mg Oral BID AC   peg 3350 powder  0.5 kit Oral Once   rosuvastatin  10 mg Oral Daily   sevelamer carbonate  2,400 mg Oral TID with meals   sodium chloride flush  3 mL Intravenous Q12H   Continuous Infusions:  anticoagulant sodium citrate       LOS: 5 days   Darliss Cheney, MD [facility]  08/11/2022, 3:33 PM   *Please note that this is a verbal dictation therefore any spelling or grammatical errors are due to the "[place]" system interpretation.  Please page via [place] and do not message via secure chat for urgent patient care matters. Secure chat can be used for non urgent patient care matters.  How to contact the [facility] Attending or Consulting provider [place] or covering provider during after hours [place], for this patient?  Check the care team in [facility] and look for a) attending/consulting [facility] provider listed and b) the [facility] team listed. Page or secure chat [phone]. Log into [email or web address] and use [place]'s universal password to access. If you do not have the password, please contact the hospital operator. Locate the [facility] provider you are looking for under [facility] and page to a number that you can be directly reached. If you still have difficulty reaching the provider, please page the [facility] (Director on Call) for the Hospitalists listed on amion for assistance.

## 2022-08-12 DIAGNOSIS — K31819 Angiodysplasia of stomach and duodenum without bleeding: Secondary | ICD-10-CM

## 2022-08-12 DIAGNOSIS — D649 Anemia, unspecified: Secondary | ICD-10-CM | POA: Diagnosis not present

## 2022-08-12 DIAGNOSIS — N186 End stage renal disease: Secondary | ICD-10-CM | POA: Diagnosis not present

## 2022-08-12 DIAGNOSIS — R627 Adult failure to thrive: Secondary | ICD-10-CM | POA: Diagnosis not present

## 2022-08-12 DIAGNOSIS — Z7189 Other specified counseling: Secondary | ICD-10-CM | POA: Diagnosis not present

## 2022-08-12 LAB — GLUCOSE, CAPILLARY
Glucose-Capillary: 94 mg/dL (ref 70–99)
Glucose-Capillary: 86 mg/dL (ref 70–99)
Glucose-Capillary: 76 mg/dL (ref 70–99)
Glucose-Capillary: 86 mg/dL (ref 70–99)

## 2022-08-12 LAB — SURGICAL PATHOLOGY

## 2022-08-12 MED ORDER — LISINOPRIL 20 MG PO TABS
20.0000 mg | ORAL_TABLET | Freq: Every day | ORAL | Status: DC
  Administered 2022-08-12 – 2022-08-17 (×6): 20 mg via ORAL
  Filled 2022-08-12 (×5): qty 1

## 2022-08-12 NOTE — Progress Notes (Signed)
Nutrition Follow-up  DOCUMENTATION CODES:   Not applicable  INTERVENTION:   Liberalize diet  Continue Renal MVI   NUTRITION DIAGNOSIS:   Increased nutrient needs related to chronic illness as evidenced by estimated needs.  ***  GOAL:   Patient will meet greater than or equal to 90% of their needs  ***  MONITOR:   PO intake, Supplement acceptance, Labs, Weight trends  REASON FOR ASSESSMENT:   Consult Assessment of nutrition requirement/status (failure to thrive)  ASSESSMENT:   82 year old female with medical history of ESRD on hemodialysis MWF, CAD s/p CABG, chronic systolic heart failure, HLD, HTN, A-fib, GERD, arthritis, gout, iron deficiency anemia, TIA, anxiety, ovarian cancer (1992), colon cancer (1992), mitral valve disease, and tricuspid valve disease. She presented to the ED due to generalized weakness. Her husband reports that patient was complaining of severe headache around the ear and down the neck, difficulty turning her neck, difficulty walking. Symptoms started about a week ago. She had a couple of shots in her neck on 1/19 due to dx of occipital neuralgia and she was given a pill for it on 1/25. Pain improved but not resolved. Patient did miss HD.  2/01 EGD/Colonoscopy: 2 cm hiatal hernia, single angio ectasia in duodenum (treated with APC), single duodenal polyp. Diverticulosis and polyps in colon  Receivies iHD MWF at [facility]. Pt is HD catheter dependent, left arm access thrombosed. Noted pt with missed HD prior to admission due to progressive weakness, difficulty ambulating, +anemia  Last iHD on 1/31 with net UF 1 L. Plan for iHD today  Outpatient EDW 59.5 kg; current wt 59 kg  Recorded po intake 0-100 % of meals; 40% of meals on average  Noted hypoglycemia this yesterday  No skin breakdown/pressure injuries per RN skin assessment  Phosphorus checked on 1/29 4.8, has not been checked since. Potassium trend wdl. No iPTH  Labs: CBGs 66-123 (not  requiring insulin coverage Meds: renvela with meals, ss novolog, rena-vite, folic acid  NUTRITION - FOCUSED PHYSICAL EXAM:  {RD Focused Exam List:21252}  Diet Order:   Diet Order             Diet renal/carb modified with fluid restriction Diet-HS Snack? Nothing; Fluid restriction: 1200 mL Fluid; Room service appropriate? Yes; Fluid consistency: Thin  Diet effective now                   EDUCATION NEEDS:   No education needs have been identified at this time  Skin:  Skin Assessment: Reviewed RN Assessment  Last BM:  1/28 (type 4 x1, large amount)  Height:   Ht Readings from Last 1 Encounters:  08/11/22 '5\' 2"'$  (1.575 m)    Weight:   Wt Readings from Last 1 Encounters:  08/11/22 59 kg    Ideal Body Weight:  50 kg  BMI:  Body mass index is 23.79 kg/m.  Estimated Nutritional Needs:   Kcal:  1620-1950 kcal  Protein:  80-95 grams  Fluid:  UOP + 1 L/day    ***

## 2022-08-12 NOTE — Progress Notes (Signed)
PT Cancellation Note  Patient Details Name: MARAI TEEHAN MRN: [id number] DOB: 1941-04-18   Cancelled Treatment:    Reason Eval/Treat Not Completed: Other (comment).  Gone to procedure, retry as time and pt allow.   Ramond Dial 08/12/2022, 1:27 PM  Mee Hives, PT PhD Acute Rehab Dept. Number: [place]

## 2022-08-12 NOTE — Progress Notes (Signed)
Occupational Therapy Treatment Patient Details Name: Shawna [name] MRN: [id number] DOB: [birth year] Today's Date: 08/12/2022   History of present illness 82 year old presented with generalized weakness, difficulty walking, severe HA, difficulty turning her neck; Symptoms started about a week prior to this admission; with past medical history significant for ESRD on hemodialysis MWF, CAD status post CABG, chronic systolic heart failure, hyperlipidemia, hypertension, A-fib   OT comments  Pt. Seen for skilled OT treatment session.  Session limited due to pt. Very lethargic and unable to awake long enough to attend to task.  Max hand over hand assistance for attempts at grooming/washing face.  BUE/head/neck rom/hep.  Pt. Remained eyes closed during this.  No grimacing or tightness noted.  Will cont. With current acute ot poc as pt. Able.     Recommendations for follow up therapy are one component of a multi-disciplinary discharge planning process, led by the attending physician.  Recommendations may be updated based on patient status, additional functional criteria and insurance authorization.    Follow Up Recommendations  Skilled nursing-short term rehab (<3 hours/day)     Assistance Recommended at Discharge Frequent or constant Supervision/Assistance  Patient can return home with the following  A lot of help with walking and/or transfers;A lot of help with bathing/dressing/bathroom;Assistance with cooking/housework;Direct supervision/assist for medications management;Direct supervision/assist for financial management;Assist for transportation;Help with stairs or ramp for entrance   Equipment Recommendations       Recommendations for Other Services      Precautions / Restrictions Precautions Precautions: Fall Restrictions Weight Bearing Restrictions: No       Mobility Bed Mobility                    Transfers                         Balance                                            ADL either performed or assessed with clinical judgement   ADL Overall ADL's : Needs assistance/impaired     Grooming: Maximal assistance;Bed level;Wash/dry face Grooming Details (indicate cue type and reason): max with hand over hand assistance, pt. unable to participate with washing face without assistance.  very lethargic. difficulty keeping eyes open/awake                               General ADL Comments: pt. very lethargic, session limited with attempted grooming tasks.  pt. difficulty staying awake. unable to engage in task without hand over hand assistance and still was unable to fully engage.  B UE rom including head and neck, pt. with not stiffness or grimacing noted.  still did not come fully awake with these efforts.    Extremity/Trunk Assessment              Vision       Perception     Praxis      Cognition Arousal/Alertness: Engineer, technical sales  General Comments      Pertinent Vitals/ Pain       Pain Assessment Pain Assessment: No/denies pain  Home Living                                          Prior Functioning/Environment              Frequency  Min 2X/week        Progress Toward Goals  OT Goals(current goals can now be found in the care plan section)  Progress towards OT goals: Progressing toward goals     Plan      Co-evaluation                 AM-PAC OT "6 Clicks" Daily Activity     Outcome Measure   Help from another person eating meals?: None Help from another person taking care of personal grooming?: A Little Help from another person toileting, which includes using toliet, bedpan, or urinal?: A Little Help from another person bathing (including washing, rinsing, drying)?: A Lot Help from another person to put on and taking off regular upper body  clothing?: A Little Help from another person to put on and taking off regular lower body clothing?: A Lot 6 Click Score: 17    End of Session    OT Visit Diagnosis: Unsteadiness on feet (R26.81);Other abnormalities of gait and mobility (R26.89);Muscle weakness (generalized) (M62.81)   Activity Tolerance Patient limited by lethargy   Patient Left in bed;with call bell/phone within reach   Nurse Communication          Time: 9622-2979 OT Time Calculation (min): 8 min  Charges: OT General Charges $OT Visit: 1 Visit OT Treatments $Self Care/Home Management : 8-22 mins  Sonia Baller, COTA/L Acute Rehabilitation [phone]   Clearnce Sorrel Lorraine-COTA/L 08/12/2022, 12:40 PM

## 2022-08-12 NOTE — Progress Notes (Signed)
PROGRESS NOTE    Shawna [name]  TIW:[id number] DOB: Feb 21, 1941 DOA: 08/06/2022 PCP: Practice, [facility]   Brief Narrative:  82 year old with past medical history significant for ESRD on hemodialysis MWF, CAD status post CABG, chronic systolic heart failure, hyperlipidemia, hypertension, A-fib presented with generalized weakness.  Her husband reports patient been complaining of severe headache around the ear and down the neck, difficulty turning her neck, difficulty walking.  Symptoms started about a week ago.  She had a couple of shots in her neck on 1/19 and she was given a pill for it on 1/25.  It is eased some  but is still.  Patient missed HD.   Patient was last admitted 12/1-3 for anemia associated with ABLA from her fistula, not on AC despite A-fib.  She returned 12/8 with cough treated with antibiotics with in the terminated chest x-ray.  She returned 1/19 with right-sided headache, diagnosed with occipital neuralgia and treated with nerve block.  Return 1/25 with neck pain attributed to the same.    Admitted with anemia, FTT, worsening confusion. Plan for endoscopy/colonoscopy.   Assessment & Plan:   Principal Problem:   Failure to thrive in adult Active Problems:   HLD (hyperlipidemia)   Hx of CABG   ESRD (end stage renal disease) on dialysis (HCC)   Anemia associated with chronic renal failure   Paroxysmal Atrial fibrillation/Flutter   Benign neoplasm of transverse colon   Benign neoplasm of descending colon   Benign neoplasm of sigmoid colon   Angiodysplasia of duodenum  1-FTT: -Patient with h/o ESRD, she has missed a couple of recent sessions due to progressive weakness, difficulty ambulating. -She has been seen in the ER twice recently (1/19 and 1/25) for occipital neuralgia -Anemia, received blood transfusion.  -Palliative care consulted for goals of care, full scope of treatment -PT OT eval, needs SNF, TOC working on placement.   Encephalopathy acute on  chronic.  Delirium  Family report confusion since December, worse over last week  TSH 5.9-PTA Synthroid dose was 50 mcg, now increased to 75 mcg. B 12; 1,981.  Ammonia level normal.  Patient is fully alert and oriented to person and place.   ESRD on HD: Hemodialysis MWF, nephrology managing.  Acute on chronic anemia of chronic disease:  Anemia of CKD, presented with hemoglobin 6.7, posttransfusion improved to 9.0 and has remained stable since last couple of days.  Occult blood positive.  GI consulted, s/p EGD and colonoscopy 08/10/2022, duodenal and colonic polyps which were removed. 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    CAD:  Status post CABG Continue with aspirin and metoprolol.   HTN:  Pressure elevated at times, continue metoprolol and amiodarone.  Will add amlodipine 5 mg p.o. daily.   A fib;  Her dialysis session was cut short today due to hypotension and tachycardia, she was in RVR, now she has been given her amiodarone as well as metoprolol and rates are controlled.  Patient has no symptoms. Not a candidate for [facility]   Hypothyroidism:  Continue with Synthroid   DM;  A1c: Continue with a sliding scale insulin   Headaches, Occipital neuralgia. Received nerve block Improved on schedule tylenol,  voltaren gel. , heating patch.   DVT prophylaxis: heparin injection 5,000 Units Start: 08/06/22 1300   Code Status: Full Code  Family Communication: none present at bedside.  Status is: Inpatient Remains inpatient appropriate because: Now medically stable, pending placement.   Estimated body mass index is 23.79 kg/m as calculated from the following:  Height as of this encounter: '5\' 2"'$  (1.575 m).   Weight as of this encounter: 59 kg.    Nutritional Assessment: Body mass index is 23.79 kg/m.Marland Kitchen Seen by dietician.  I agree with the assessment and plan as outlined below: Nutrition Status: Nutrition Problem: Increased nutrient needs Etiology: chronic illness Signs/Symptoms: estimated  needs Interventions: Nepro shake, MVI, Prostat  . Skin Assessment: I have examined the patient's skin and I agree with the wound assessment as performed by the wound care RN as outlined below:    Consultants:  GI and nephrology  Procedures:  As above  Antimicrobials:  Anti-infectives (From admission, onward)    None         Subjective:  She was seen and examined.  She has no complaints.  She is fully alert and oriented x 2.  Objective: Vitals:   08/11/22 1625 08/11/22 2055 08/12/22 0613 08/12/22 0937  BP: (!) 164/69 (!) 150/49 (!) 154/59 (!) 170/67  Pulse: 68 64 64 66  Resp: '18 18 18 17  '$ Temp: 98 F (36.7 C) 98 F (36.7 C) 98.5 F (36.9 C) 98.1 F (36.7 C)  TempSrc:      SpO2: 99% 96% 95% 98%  Weight:      Height:        Intake/Output Summary (Last 24 hours) at 08/12/2022 1143 Last data filed at 08/12/2022 0000 Gross per 24 hour  Intake 270 ml  Output 0 ml  Net 270 ml    Filed Weights   08/08/22 0418 08/10/22 0537 08/11/22 0734  Weight: 65.5 kg 59 kg 59 kg    Examination:  General exam: Appears calm and comfortable  Respiratory system: Clear to auscultation. Respiratory effort normal. Cardiovascular system: S1 & S2 heard, RRR. No JVD, murmurs, rubs, gallops or clicks. No pedal edema. Gastrointestinal system: Abdomen is nondistended, soft and nontender. No organomegaly or masses felt. Normal bowel sounds heard. Central nervous system: Alert and oriented x2. No focal neurological deficits. Extremities: Symmetric 5 x 5 power. Skin: No rashes, lesions or ulcers.  Psychiatry: Judgement and insight appear normal. Mood & affect appropriate.   Data Reviewed: I have personally reviewed following labs and imaging studies  CBC: Recent Labs  Lab 08/06/22 0139 08/07/22 1148 08/08/22 0859 08/09/22 0559 08/10/22 0443  WBC 8.6 8.9 7.3 8.6 8.1  NEUTROABS 6.2  --   --   --   --   HGB 6.7* 9.0* 7.9* 8.7* 8.5*  HCT 21.1* 27.6* 24.2* 27.7* 25.5*  MCV 117.9*  107.8* 109.0* 109.5* 107.1*  PLT 309 289 264 282 950    Basic Metabolic Panel: Recent Labs  Lab 08/06/22 0139 08/07/22 1148 08/08/22 0911 08/10/22 0443  NA 138 133* 135 137  K 4.1 3.7 3.6 4.6  CL 99 96* 97* 95*  CO2 '25 22 25 23  '$ GLUCOSE 113* 122* 127* 91  BUN 46* 36* 57* 62*  CREATININE 7.30* 5.33* 6.94* 6.32*  CALCIUM 9.2 9.0 8.3* 9.3  PHOS  --   --  4.8*  --     GFR: Estimated Creatinine Clearance: 5.4 mL/min (A) (by C-G formula based on SCr of 6.32 mg/dL (H)). Liver Function Tests: Recent Labs  Lab 08/06/22 0139 08/08/22 0911  AST 11*  --   ALT 16  --   ALKPHOS 99  --   BILITOT 0.5  --   PROT 6.5  --   ALBUMIN 3.0* 2.4*    No results for input(s): "LIPASE", "AMYLASE" in the last 168 hours. Recent Labs  Lab 08/10/22 0443  AMMONIA 31    Coagulation Profile: No results for input(s): "INR", "PROTIME" in the last 168 hours. Cardiac Enzymes: No results for input(s): "CKTOTAL", "CKMB", "CKMBINDEX", "TROPONINI" in the last 168 hours. BNP (last 3 results) No results for input(s): "PROBNP" in the last 8760 hours. HbA1C: No results for input(s): "HGBA1C" in the last 72 hours.  CBG: Recent Labs  Lab 08/11/22 1236 08/11/22 1625 08/11/22 2055 08/12/22 0737 08/12/22 1126  GLUCAP 98 98 123* 94 86    Lipid Profile: No results for input(s): "CHOL", "HDL", "LDLCALC", "TRIG", "CHOLHDL", "LDLDIRECT" in the last 72 hours. Thyroid Function Tests: No results for input(s): "TSH", "T4TOTAL", "FREET4", "T3FREE", "THYROIDAB" in the last 72 hours.  Anemia Panel: No results for input(s): "VITAMINB12", "FOLATE", "FERRITIN", "TIBC", "IRON", "RETICCTPCT" in the last 72 hours.  Sepsis Labs: No results for input(s): "PROCALCITON", "LATICACIDVEN" in the last 168 hours.  No results found for this or any previous visit (from the past 240 hour(s)).   Radiology Studies: No results found.  Scheduled Meds:  (feeding supplement) PROSource Plus  30 mL Oral BID BM   sodium  chloride   Intravenous Once   acetaminophen  500 mg Oral BID   amiodarone  100 mg Oral Daily   aspirin EC  81 mg Oral Daily   Chlorhexidine Gluconate Cloth  6 each Topical Q0600   diclofenac Sodium  2 g Topical BID   feeding supplement (NEPRO CARB STEADY)  237 mL Oral Q59D   folic acid  1 mg Oral Daily   heparin  5,000 Units Subcutaneous Q8H   insulin aspart  0-6 Units Subcutaneous TID WC   levothyroxine  75 mcg Oral QAC breakfast   metoprolol tartrate  12.5 mg Oral BID   multivitamin  1 tablet Oral Daily   omeprazole  20 mg Oral BID AC   peg 3350 powder  0.5 kit Oral Once   rosuvastatin  10 mg Oral Daily   sevelamer carbonate  2,400 mg Oral TID with meals   sodium chloride flush  3 mL Intravenous Q12H   Continuous Infusions:  anticoagulant sodium citrate       LOS: 6 days   Darliss Cheney, MD [facility]  08/12/2022, 11:43 AM   *Please note that this is a verbal dictation therefore any spelling or grammatical errors are due to the "[place] [employer]" system interpretation.  Please page via [place] and do not message via secure chat for urgent patient care matters. Secure chat can be used for non urgent patient care matters.  How to contact the [facility] Attending or Consulting provider [place] or covering provider during after hours [place], for this patient?  Check the care team in [facility] and look for a) attending/consulting TRH provider listed and b) the [facility] team listed. Page or secure chat [phone]. Log into [email or web address] and use [facility] universal password to access. If you do not have the password, please contact the hospital operator. Locate the [facility] provider you are looking for under [facility] and page to a number that you can be directly reached. If you still have difficulty reaching the provider, please page the [facility] (Director on Call) for the Hospitalists listed on amion for assistance.

## 2022-08-12 NOTE — TOC Progression Note (Addendum)
Transition of Care [facility]) - Initial/Assessment Note    Patient Details  Name: Shawna [name] MRN: [id number] Date of Birth: 18-Nov-1940  Transition of Care [facility]) CM/SW Contact:    Milinda Antis, [place] Phone Number: 08/12/2022, 10:58 AM  Clinical Narrative:                 LCSW contacted the patient's spouse to present bed offers.  The spouse is in agreement with [facility].  LCSW also contacted the daughter and provided an update.    LCSW contacted admissions at [facility] to verify bed availability and there was no answer.  LCSW left a secure VM requesting a returned call.  The facility will need to start insurance authorization.  11:00-  LCSW received a returned call from [place] [facility] (SNF).  The facility can accept and will begin insurance auth.    TOC following.   Expected Discharge Plan: Skilled Nursing Facility Barriers to Discharge: Continued Medical Work up, Ship broker, SNF Pending bed offer   Patient Goals and CMS Choice   CMS Medicare.gov Compare Post Acute Care list provided to:: Patient Represenative (must comment) Choice offered to / list presented to : Spouse, Adult Children      Expected Discharge Plan and Services In-house Referral: Clinical Social Work     Living arrangements for the past 2 months: Single Family Home                                      Prior Living Arrangements/Services Living arrangements for the past 2 months: Single Family Home Lives with:: Spouse Patient language and need for interpreter reviewed:: Yes Do you feel safe going back to the place where you live?: Yes      Need for Family Participation in Patient Care: Yes (Comment) Care giver support system in place?: Yes (comment)   Criminal Activity/Legal Involvement Pertinent to Current Situation/Hospitalization: No - Comment as needed  Activities of Daily Living Home Assistive Devices/Equipment: None ADL Screening (condition at time of  admission) Patient's cognitive ability adequate to safely complete daily activities?: Yes Is the patient deaf or have difficulty hearing?: No Does the patient have difficulty seeing, even when wearing glasses/contacts?: No Does the patient have difficulty concentrating, remembering, or making decisions?: Yes Patient able to express need for assistance with ADLs?: Yes Does the patient have difficulty dressing or bathing?: Yes Independently performs ADLs?: No Does the patient have difficulty walking or climbing stairs?: Yes Weakness of Legs: Both Weakness of Arms/Hands: None  Permission Sought/Granted   Permission granted to share information with : Yes, Verbal Permission Granted (patient disoriented, permission given via spouse)     Permission granted to share info w AGENCY: SNFs        Emotional Assessment       Orientation: : Oriented to Self Alcohol / Substance Use: Not Applicable Psych Involvement: No (comment)  Admission diagnosis:  Generalized weakness [R53.1] Symptomatic anemia [D64.9] Patient Active Problem List   Diagnosis Date Noted   Benign neoplasm of transverse colon 08/11/2022   Benign neoplasm of descending colon 08/11/2022   Benign neoplasm of sigmoid colon 08/11/2022   Angiodysplasia of duodenum 08/11/2022   Delirium 04/22/2022   History of CAD (coronary artery disease) 04/22/2022   NSTEMI, initial episode of care ([place]) 10/19/2021   Symptomatic anemia 09/09/2021   Ischemic cardiomyopathy    Paroxysmal Atrial fibrillation/Flutter 05/07/2021   Neuropathy 04/23/2021  Anemia associated with chronic renal failure 04/13/2021   Left knee pain 03/15/2021   Moderate protein-calorie malnutrition ([place]) 02/27/2021   COVID-19 virus infection 02/03/2021   Leukocytosis 02/03/2021   Hypothyroidism (acquired) 02/03/2021   Myositis 12/03/2020   Ataxia 12/02/2020   Diabetes mellitus type 2 in nonobese ([place]) 11/10/2020   Failure to thrive in adult 11/09/2020    Dialysis AV fistula malfunction, initial encounter ([place])    Jugular vein occlusion, right ([place])    Failure of surgically constructed arteriovenous fistula ([place]) 10/03/2020   Myoclonus 08/31/2020   Clotted renal dialysis AV graft, initial encounter ([place])    Hemodialysis-associated hypotension    Irritable bowel syndrome 02/25/2020   Adenomatous duodenal polyp 09/10/2019   History of GI bleed 09/10/2019   Hand steal syndrome ([place]) 08/01/2017   Coronary artery disease 25/11/3974   Complication of vascular access for dialysis 03/19/2017   Preoperative clearance 01/25/2017   H/O non-ST elevation myocardial infarction (NSTEMI) 10/24/2016   Non-ST elevation (NSTEMI) myocardial infarction [facility])    Cardiac arrest [facility])    Palliative care encounter    Goals of care, counseling/discussion    Flash pulmonary edema ([place]) 04/06/2016   History of colon cancer 01/27/2016   History of ovarian cancer 01/27/2016   PAF (paroxysmal atrial fibrillation) ([place]) 10/14/2015   Malignant neoplasm of right ovary ([place]) 10/14/2015   SVT (supraventricular tachycardia) 09/08/2015   Dyspnea    Occlusion and stenosis of carotid artery without mention of cerebral infarction 01/24/2013   Hx of CABG 07/05/2012   Carotid artery disease ([place]) 07/05/2012   Mitral regurgitation 06/12/2012   Non-STEMI (non-ST elevated myocardial infarction) ([place]) 06/08/2012   Chronic combined systolic and diastolic CHF (congestive heart failure) ([place]) 05/02/2012   AVM (arteriovenous malformation) of small bowel, acquired 01/20/2012   GERD (gastroesophageal reflux disease) 01/09/2012   HLD (hyperlipidemia) 01/05/2012   Atherosclerotic heart disease of native coronary artery without angina pectoris 12/16/2011   Anxiety disorder 05/04/2011   ESRD (end stage renal disease) on dialysis ([place]) 04/29/2011   Gout 04/29/2011   PCP:  [facility], [place]:   [facility] [address] [place] [address] Phone: [phone] Fax: [phone]  [facility] (ESRD Bundle Only) - [place], [address] [address] Phone: [phone] Fax: [phone]     Social Determinants of Health ([place]) Social History: [place]: No Food Insecurity (08/06/2022)  Housing: Low Risk  (08/06/2022)  Transportation Needs: No Transportation Needs (08/06/2022)  Utilities: Not At Risk (08/06/2022)  Financial Resource Strain: Low Risk  (12/14/2018)  Physical Activity: Unknown (12/14/2018)  Social Connections: Unknown (12/14/2018)  Stress: No Stress Concern Present (12/14/2018)  Tobacco Use: Low Risk  (08/11/2022)   SDOH Interventions:     Readmission Risk Interventions    11/12/2021    8:56 AM 10/20/2021    4:38 PM 07/23/2021    4:26 PM  Readmission Risk Prevention Plan  Transportation Screening Complete Complete Complete  Medication Review (RN Care Manager) Complete Complete Complete  PCP or Specialist appointment within 3-5 days of discharge Complete Complete Complete  HRI or Home Care Consult Complete Complete Complete  SW Recovery Care/Counseling Consult Complete Complete Complete  Palliative Care Screening Not Applicable Not Applicable Not Applicable  Skilled Nursing Facility Complete Not Applicable Not Applicable

## 2022-08-12 NOTE — Progress Notes (Signed)
[facility] Progress Note    Assessment/ Plan:   ESRD: -Outpatient orders: Javier Docker, MWF. 3.5hrs. Nipro. 300 BFR. 2k/2,5cal. TDC. Mircera 143mg (last dose 08/01/22). EDW 59.5kg. Her left arm access has been thrombosed for 1-2 mths. She is catheter dependent for now; d/w her unit.  -HD on MWF schedule. Had to cut short her treatment 1/31 because of h/a and drop in BP, able to UF 1L. Plan on next HD Fri on MWF regimen; hopefully we can get her through a full treatment. BP better today also.  FTT -per primary service. Palliative consulted: full scope of care   Volume/ hypertension: EDW 59.5kg. Will UF as tolerated.   Anemia of Chronic Kidney Disease, symptomatic anemia: Hemoglobin 6.7 on presentation, transfuse PRN for hgb <7. Hb 8.5 Just received ESA as an outpatient, will redose accordingly.    Secondary Hyperparathyroidism/Hyperphosphatemia: resume home binders   Subjective:   No acute events overnight. S/p scope 2/1 and tolerated. Feeling a lot better today but poor appetite. Denies SOB/n/v.    Objective:   BP (!) 154/59 (BP Location: Right Arm)   Pulse 64   Temp 98.5 F (36.9 C)   Resp 18   Ht '5\' 2"'$  (1.575 m)   Wt 59 kg   SpO2 95%   BMI 23.79 kg/m   Intake/Output Summary (Last 24 hours) at 08/12/2022 0851 Last data filed at 08/12/2022 0000 Gross per 24 hour  Intake 630 ml  Output 0 ml  Net 630 ml   Weight change:   Physical Exam: Gen: NAD CVS: RRR Resp: normal WOB Abd: soft, nt/nd Ext: tr pitting edema b/l Les Neuro: awake, interactive Dialysis access: Left chest TDC c/d/I, LUE AVG no bruit  Imaging: No results found.  Labs: BMET Recent Labs  Lab 08/06/22 0139 08/07/22 1148 08/08/22 0911 08/10/22 0443  NA 138 133* 135 137  K 4.1 3.7 3.6 4.6  CL 99 96* 97* 95*  CO2 '25 22 25 23  '$ GLUCOSE 113* 122* 127* 91  BUN 46* 36* 57* 62*  CREATININE 7.30* 5.33* 6.94* 6.32*  CALCIUM 9.2 9.0 8.3* 9.3  PHOS  --   --  4.8*  --    CBC Recent  Labs  Lab 08/06/22 0139 08/07/22 1148 08/08/22 0859 08/09/22 0559 08/10/22 0443  WBC 8.6 8.9 7.3 8.6 8.1  NEUTROABS 6.2  --   --   --   --   HGB 6.7* 9.0* 7.9* 8.7* 8.5*  HCT 21.1* 27.6* 24.2* 27.7* 25.5*  MCV 117.9* 107.8* 109.0* 109.5* 107.1*  PLT 309 289 264 282 305    Medications:     (feeding supplement) PROSource Plus  30 mL Oral BID BM   sodium chloride   Intravenous Once   acetaminophen  500 mg Oral BID   amiodarone  100 mg Oral Daily   aspirin EC  81 mg Oral Daily   Chlorhexidine Gluconate Cloth  6 each Topical Q0600   diclofenac Sodium  2 g Topical BID   feeding supplement (NEPRO CARB STEADY)  237 mL Oral QM35T  folic acid  1 mg Oral Daily   heparin  5,000 Units Subcutaneous Q8H   insulin aspart  0-6 Units Subcutaneous TID WC   levothyroxine  75 mcg Oral QAC breakfast   metoprolol tartrate  12.5 mg Oral BID   multivitamin  1 tablet Oral Daily   omeprazole  20 mg Oral BID AC   peg 3350 powder  0.5 kit Oral Once   rosuvastatin  10 mg Oral Daily   sevelamer carbonate  2,400 mg Oral TID with meals   sodium chloride flush  3 mL Intravenous Q12H      Otelia Santee, MD [facility] 08/12/2022, 8:51 AM

## 2022-08-12 NOTE — Progress Notes (Signed)
Received patient in bed to unit.  Alert and oriented.  Informed consent signed and in chart.   Treatment initiated: 1250 Treatment completed: 1600  Patient tolerated well.  Transported back to the room  Alert, without acute distress.  Hand-off given to patient's nurse.   Access used: Catheter Access issues: none  Total UF removed: 2L Medication(s) given: None Post HD VS: 97.7,113/57,72,16,100% Post HD weight: 54.1kg   [facility]

## 2022-08-12 NOTE — Progress Notes (Signed)
Daily Progress Note   Patient Name: Shawna [name]       Date: 08/12/2022 DOB: April 11, 1941  Age: 82 y.o. MRN#: [id number] Attending Physician: Darliss Cheney, MD Primary Care Physician: [facility] Admit Date: 08/06/2022  Reason for Consultation/Follow-up: Establishing goals of care  Subjective: I have reviewed medical records including EPIC notes, imaging, and labs.  Patient assessed in HD and she denies pain or concerns, in good spirits today.  She vaguely remembers meeting with palliative medicine provider during a prior hospitalization.  She tells me that her goals and care preferences have remained the same since then.  She finds her current quality of life to be acceptable.  Called patient's husband for ongoing support and goals of care conversations.  He confirms that he received update on patient's endoscopy and colonoscopy results.  He continues to desire full code/full scope treatment for his wife per her previously stated wishes.  We discussed recommendations for SNF since she cannot walk and his selection of [facility].  Outpatient palliative care was when offered.  He is agreeable and appreciative of the referral.  Questions and concerns addressed.  PMT will continue to follow and support holistically.  Length of Stay: 6  Physical Exam Vitals and nursing note reviewed.  Constitutional:      General: She is not in acute distress. Pulmonary:     Effort: No respiratory distress.  Skin:    General: Skin is warm and dry.  Neurological:     Mental Status: She is alert.     Motor: Weakness present.  Psychiatric:        Attention and Perception: Attention normal.        Behavior: Behavior is cooperative.        Cognition and Memory: Cognition is impaired. Memory is  impaired.         Vital Signs: BP (!) 170/61 (BP Location: Right Arm)   Pulse 69   Temp 98.1 F (36.7 C) (Oral)   Resp 20   Ht '5\' 2"'$  (1.575 m)   Wt 56 kg   SpO2 100%   BMI 22.58 kg/m  SpO2: SpO2: 100 % O2 Device: O2 Device: Room Air O2 Flow Rate: O2 Flow Rate (L/min): 2 L/min  Intake/output summary:  Intake/Output Summary (Last 24 hours) at 08/12/2022 1426 Last data filed  at 08/12/2022 1300 Gross per 24 hour  Intake 240 ml  Output 0 ml  Net 240 ml    LBM: Last BM Date : 08/10/22 Baseline Weight: Weight: 64.7 kg Most recent weight: Weight: 56 kg       Palliative Assessment/Data: PPS 40%    Palliative Care Assessment & Plan   Patient Profile: 82 y.o. female  with past medical history of ESRD on MWF HD, CAD s/p CABG, chronic systolic CHF, HLD, HTN, and afib  admitted on 08/06/2022 with generalized weakness.    Patient has had 3 admissions and 4 ED visits in the past 6 months.  She is now admitted with failure to thrive after missing HD. She was last admitted from 12/1-3 for anemia associated with ABLA from her bleeding fistula, not on AC despite afib. She returned on 12/8 with a cough, treated with Amoxil and Doxy with indeterminate CXR. She returned on 1/19 with R-sided headache, diagnosed with occipital neuralgia and treated with nerve block. She returned again on 1/25 with neck pain attributed to the same. She was not started on treatment.   Assessment: Principal Problem:   Failure to thrive in adult Active Problems:   HLD (hyperlipidemia)   Hx of CABG   ESRD (end stage renal disease) on dialysis (HCC)   Anemia associated with chronic renal failure   Paroxysmal Atrial fibrillation/Flutter   Benign neoplasm of transverse colon   Benign neoplasm of descending colon   Benign neoplasm of sigmoid colon   Angiodysplasia of duodenum   Recommendations/Plan: Continue full code/full scope treatment Goals of care remain clear after discussion with patient and her husband  today TOC consulted for outpatient palliative care referral Psychosocial and emotional support provided PMT will continue to follow and support   Prognosis:  Poor long-term prognosis given progressive decline, multiple hospitalizations, and multiple chronic comorbidities   Discharge Planning: To Be Determined  Care plan was discussed with patient, patient's husband   MDM: High   Eriona Kinchen Gregary Signs Palliative Medicine Team Team phone # [phone]  Thank you for allowing the Palliative Medicine Team to assist in the care of this patient. Please utilize secure chat with additional questions, if there is no response within 30 minutes please call the above phone number.  Palliative Medicine Team providers are available by phone from 7am to 7pm daily and can be reached through the team cell phone.  Should this patient require assistance outside of these hours, please call the patient's attending physician.  Portions of this note are a verbal dictation therefore any spelling and/or grammatical errors are due to the "[place] One" system interpretation.

## 2022-08-13 ENCOUNTER — Encounter: Payer: Self-pay | Admitting: Internal Medicine

## 2022-08-13 DIAGNOSIS — R627 Adult failure to thrive: Secondary | ICD-10-CM | POA: Diagnosis not present

## 2022-08-13 LAB — GLUCOSE, CAPILLARY
Glucose-Capillary: 102 mg/dL — ABNORMAL HIGH (ref 70–99)
Glucose-Capillary: 185 mg/dL — ABNORMAL HIGH (ref 70–99)
Glucose-Capillary: 91 mg/dL (ref 70–99)
Glucose-Capillary: 156 mg/dL — ABNORMAL HIGH (ref 70–99)

## 2022-08-13 MED ORDER — ALPRAZOLAM 0.5 MG PO TABS: 0.5000 mg | ORAL_TABLET | Freq: Every day | ORAL | 0 refills | Status: DC | PRN

## 2022-08-13 MED ORDER — LEVOTHYROXINE SODIUM 75 MCG PO TABS: 75.0000 ug | ORAL_TABLET | Freq: Every day | ORAL | 0 refills | Status: DC

## 2022-08-13 NOTE — Progress Notes (Addendum)
PROGRESS NOTE    Shawna [name]  JJH:[id number] DOB: October 27, 1940 DOA: 08/06/2022 PCP: [facility]   Brief Narrative:  82 year old with past medical history significant for ESRD on hemodialysis MWF, CAD status post CABG, chronic systolic heart failure, hyperlipidemia, hypertension, A-fib presented with generalized weakness.  Her husband reports patient been complaining of severe headache around the ear and down the neck, difficulty turning her neck, difficulty walking.  Symptoms started about a week ago.  She had a couple of shots in her neck on 1/19 and she was given a pill for it on 1/25.  It is eased some  but is still.  Patient missed HD.   Patient was last admitted 12/1-3 for anemia associated with ABLA from her fistula, not on AC despite A-fib.  She returned 12/8 with cough treated with antibiotics with in the terminated chest x-ray.  She returned 1/19 with right-sided headache, diagnosed with occipital neuralgia and treated with nerve block.  Return 1/25 with neck pain attributed to the same.    Admitted with anemia, FTT, worsening confusion. Plan for endoscopy/colonoscopy.   Assessment & Plan:   Principal Problem:   Failure to thrive in adult Active Problems:   HLD (hyperlipidemia)   Hx of CABG   ESRD (end stage renal disease) on dialysis (HCC)   Anemia associated with chronic renal failure   Paroxysmal Atrial fibrillation/Flutter   Benign neoplasm of transverse colon   Benign neoplasm of descending colon   Benign neoplasm of sigmoid colon   Angiodysplasia of duodenum  1-FTT: -Patient with h/o ESRD, she has missed a couple of recent sessions due to progressive weakness, difficulty ambulating. -She has been seen in the ER twice recently (1/19 and 1/25) for occipital neuralgia -Anemia, received blood transfusion.  -Palliative care consulted for goals of care, full scope of treatment -PT OT eval, needs SNF, TOC working on placement.   Encephalopathy acute on  chronic.  Delirium  Family report confusion since December, worse over last week  TSH 5.9-PTA Synthroid dose was 50 mcg, now increased to 75 mcg. B 12; 1,981.  Ammonia level normal.  Patient is fully alert and oriented to person and place.   ESRD on HD: Hemodialysis MWF, nephrology managing.  Acute on chronic anemia of chronic disease:  Anemia of CKD, presented with hemoglobin 6.7, posttransfusion improved to 9.0 and has remained stable since last couple of days.  Occult blood positive.  GI consulted, s/p EGD and colonoscopy 08/10/2022, duodenal and colonic polyps which were removed. 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    CAD:  Status post CABG Continue with aspirin and metoprolol.   HTN:  Pressure slightly elevated at times, continue current dose of amlodipine, metoprolol and amiodarone.     A fib;  Her dialysis session was cut short today due to hypotension and tachycardia, she was in RVR, now she has been given her amiodarone as well as metoprolol and rates are controlled.  Patient has no symptoms. Not a candidate for [facility]   Hypothyroidism:  Continue with Synthroid   DM;  A1c: Continue with a sliding scale insulin   Headaches, Occipital neuralgia. Received nerve block Improved on schedule tylenol,  voltaren gel. , heating patch.   DVT prophylaxis: heparin injection 5,000 Units Start: 08/06/22 1300   Code Status: Full Code  Family Communication: none present at bedside.  Status is: Inpatient Remains inpatient appropriate because: Now medically stable, pending placement.   Estimated body mass index is 21.81 kg/m as calculated from the following:  Height as of this encounter: '5\' 2"'$  (1.575 m).   Weight as of this encounter: 54.1 kg.    Nutritional Assessment: Body mass index is 21.81 kg/m.Marland Kitchen Seen by dietician.  I agree with the assessment and plan as outlined below: Nutrition Status: Nutrition Problem: Increased nutrient needs Etiology: chronic illness Signs/Symptoms: estimated  needs Interventions: Nepro shake, MVI, Prostat  . Skin Assessment: I have examined the patient's skin and I agree with the wound assessment as performed by the wound care RN as outlined below:    Consultants:  GI and nephrology  Procedures:  As above  Antimicrobials:  Anti-infectives (From admission, onward)    None         Subjective:  Patient seen and examined.  She is alert and oriented to person only.  She has no complaints.  Objective: Vitals:   08/12/22 1651 08/12/22 2105 08/13/22 0602 08/13/22 0900  BP: (!) 157/51 (!) 135/105 (!) 162/64 (!) 158/67  Pulse: 72 65 66 68  Resp: '17 18 18 18  '$ Temp: 98.1 F (36.7 C) 98.4 F (36.9 C)  98.2 F (36.8 C)  TempSrc:  Oral  Oral  SpO2: 100% 100% 100% 100%  Weight:      Height:        Intake/Output Summary (Last 24 hours) at 08/13/2022 1146 Last data filed at 08/13/2022 0900 Gross per 24 hour  Intake 560 ml  Output 2000 ml  Net -1440 ml    Filed Weights   08/11/22 0734 08/12/22 1223 08/12/22 1615  Weight: 59 kg 56 kg 54.1 kg    Examination:  General exam: Appears calm and comfortable  Respiratory system: Clear to auscultation. Respiratory effort normal. Cardiovascular system: S1 & S2 heard, RRR. No JVD, murmurs, rubs, gallops or clicks. No pedal edema. Gastrointestinal system: Abdomen is nondistended, soft and nontender. No organomegaly or masses felt. Normal bowel sounds heard. Central nervous system: Alert and oriented x 1. No focal neurological deficits. Extremities: Symmetric 5 x 5 power. Skin: No rashes, lesions or ulcers.  Psychiatry: Judgement and insight appear poor.   Data Reviewed: I have personally reviewed following labs and imaging studies  CBC: Recent Labs  Lab 08/07/22 1148 08/08/22 0859 08/09/22 0559 08/10/22 0443  WBC 8.9 7.3 8.6 8.1  HGB 9.0* 7.9* 8.7* 8.5*  HCT 27.6* 24.2* 27.7* 25.5*  MCV 107.8* 109.0* 109.5* 107.1*  PLT 289 264 282 203    Basic Metabolic Panel: Recent Labs   Lab 08/07/22 1148 08/08/22 0911 08/10/22 0443  NA 133* 135 137  K 3.7 3.6 4.6  CL 96* 97* 95*  CO2 '22 25 23  '$ GLUCOSE 122* 127* 91  BUN 36* 57* 62*  CREATININE 5.33* 6.94* 6.32*  CALCIUM 9.0 8.3* 9.3  PHOS  --  4.8*  --     GFR: Estimated Creatinine Clearance: 5.4 mL/min (A) (by C-G formula based on SCr of 6.32 mg/dL (H)). Liver Function Tests: Recent Labs  Lab 08/08/22 0911  ALBUMIN 2.4*    No results for input(s): "LIPASE", "AMYLASE" in the last 168 hours. Recent Labs  Lab 08/10/22 0443  AMMONIA 31    Coagulation Profile: No results for input(s): "INR", "PROTIME" in the last 168 hours. Cardiac Enzymes: No results for input(s): "CKTOTAL", "CKMB", "CKMBINDEX", "TROPONINI" in the last 168 hours. BNP (last 3 results) No results for input(s): "PROBNP" in the last 8760 hours. HbA1C: No results for input(s): "HGBA1C" in the last 72 hours.  CBG: Recent Labs  Lab 08/12/22 1126 08/12/22 1648 08/12/22 2109  08/13/22 0728 08/13/22 1121  GLUCAP 86 76 86 102* 185*    Lipid Profile: No results for input(s): "CHOL", "HDL", "LDLCALC", "TRIG", "CHOLHDL", "LDLDIRECT" in the last 72 hours. Thyroid Function Tests: No results for input(s): "TSH", "T4TOTAL", "FREET4", "T3FREE", "THYROIDAB" in the last 72 hours.  Anemia Panel: No results for input(s): "VITAMINB12", "FOLATE", "FERRITIN", "TIBC", "IRON", "RETICCTPCT" in the last 72 hours.  Sepsis Labs: No results for input(s): "PROCALCITON", "LATICACIDVEN" in the last 168 hours.  No results found for this or any previous visit (from the past 240 hour(s)).   Radiology Studies: No results found.  Scheduled Meds:  (feeding supplement) PROSource Plus  30 mL Oral BID BM   sodium chloride   Intravenous Once   acetaminophen  500 mg Oral BID   amiodarone  100 mg Oral Daily   aspirin EC  81 mg Oral Daily   Chlorhexidine Gluconate Cloth  6 each Topical Q0600   diclofenac Sodium  2 g Topical BID   feeding supplement (NEPRO  CARB STEADY)  237 mL Oral R15Q   folic acid  1 mg Oral Daily   heparin  5,000 Units Subcutaneous Q8H   insulin aspart  0-6 Units Subcutaneous TID WC   levothyroxine  75 mcg Oral QAC breakfast   lisinopril  20 mg Oral Daily   metoprolol tartrate  12.5 mg Oral BID   multivitamin  1 tablet Oral Daily   omeprazole  20 mg Oral BID AC   peg 3350 powder  0.5 kit Oral Once   rosuvastatin  10 mg Oral Daily   sevelamer carbonate  2,400 mg Oral TID with meals   sodium chloride flush  3 mL Intravenous Q12H   Continuous Infusions:  anticoagulant sodium citrate       LOS: 7 days   Darliss Cheney, MD [facility]  08/13/2022, 11:46 AM   *Please note that this is a verbal dictation therefore any spelling or grammatical errors are due to the "[place] One" system interpretation.  Please page via [place] and do not message via secure chat for urgent patient care matters. Secure chat can be used for non urgent patient care matters.  How to contact the [facility] Attending or Consulting provider [place] or covering provider during after hours [place], for this patient?  Check the care team in [facility] and look for a) attending/consulting [facility] provider listed and b) the [facility] team listed. Page or secure chat [phone]-[phone]. Log into [email or web address] and use [place]'s universal password to access. If you do not have the password, please contact the hospital operator. Locate the [facility] provider you are looking for under [facility] and page to a number that you can be directly reached. If you still have difficulty reaching the provider, please page the [facility] (Director on Call) for the Hospitalists listed on amion for assistance.

## 2022-08-13 NOTE — Progress Notes (Signed)
Physical Therapy Treatment Patient Details Name: Shawna [name] MRN: [id number] DOB: 05/10/1941 Today's Date: 08/13/2022   History of Present Illness 82 year old presented with generalized weakness, difficulty walking, severe HA, difficulty turning her neck; Symptoms started about a week prior to this admission; with past medical history significant for ESRD on hemodialysis MWF, CAD status post CABG, chronic systolic heart failure, hyperlipidemia, hypertension, A-fib    PT Comments    Progressing towards acute rehab goals. Remains confused but pleasant today. Frequently needs redirected for task at hand. Able to walk with min assist for RW control and to facilitate continuous progress/steps and direction up to 70'. Min assist with bed mobility and sit<>stand transfers. Patient will continue to benefit from skilled physical therapy services to further improve independence with functional mobility.    Recommendations for follow up therapy are one component of a multi-disciplinary discharge planning process, led by the attending physician.  Recommendations may be updated based on patient status, additional functional criteria and insurance authorization.  Follow Up Recommendations  Skilled nursing-short term rehab (<3 hours/day) (Request not [facility], where she was previously) Can patient physically be transported by private vehicle: Yes   Assistance Recommended at Discharge Frequent or constant Supervision/Assistance  Patient can return home with the following A little help with walking and/or transfers;A little help with bathing/dressing/bathroom;Assistance with cooking/housework;Assist for transportation;Help with stairs or ramp for entrance   Equipment Recommendations  Rolling walker (2 wheels);BSC/3in1    Recommendations for Other Services       Precautions / Restrictions Precautions Precautions: Fall Restrictions Weight Bearing Restrictions: No     Mobility  Bed  Mobility Overal bed mobility: Needs Assistance Bed Mobility: Supine to Sit     Supine to sit: Min assist     General bed mobility comments: Min assist to facilitate and scoot pt to EOB with light trunk support, cues for technique.    Transfers Overall transfer level: Needs assistance Equipment used: Rolling walker (2 wheels) Transfers: Sit to/from Stand Sit to Stand: Min assist           General transfer comment: Min assist for boost to stand, mild unsteadiness but improves with bil hands to RW upon standing. Cues for techniques.    Ambulation/Gait Ambulation/Gait assistance: Min assist Gait Distance (Feet): 70 Feet Assistive device: Rolling walker (2 wheels) Gait Pattern/deviations: Step-through pattern, Decreased stride length, Trunk flexed   Gait velocity interpretation: <1.31 ft/sec, indicative of household ambulator   General Gait Details: Educated on appropriate AD use with RW. Requires frequent min assist for RW control and to facilitate foward momentum. Frequently reverts to flexed posture despite being able to corect with verbally cues. One episode LE buckling lightly but able to self correct - pt reports happens occasionally.   Stairs             Wheelchair Mobility    Modified Rankin (Stroke Patients Only)       Balance Overall balance assessment: Needs assistance Sitting-balance support: Feet supported, No upper extremity supported Sitting balance-Leahy Scale: Fair       Standing balance-Leahy Scale: Poor Standing balance comment: reliant on external support                            Cognition Arousal/Alertness: Awake/alert Behavior During Therapy: WFL for tasks assessed/performed Overall Cognitive Status: No family/caregiver present to determine baseline cognitive functioning Area of Impairment: Orientation, Memory, Safety/judgement, Problem solving  Orientation Level: Disoriented to, Place, Time,  Situation   Memory: Decreased short-term memory   Safety/Judgement: Decreased awareness of safety, Decreased awareness of deficits   Problem Solving: Decreased initiation, Requires verbal cues, Difficulty sequencing, Requires tactile cues          Exercises      General Comments        Pertinent Vitals/Pain Pain Assessment Pain Assessment: No/denies pain Pain Intervention(s): Monitored during session    Home Living                          Prior Function            PT Goals (current goals can now be found in the care plan section) Acute Rehab PT Goals Patient Stated Goal: did not state, but agreeable to getting up PT Goal Formulation: With patient/family Time For Goal Achievement: 08/27/22 Potential to Achieve Goals: Good Progress towards PT goals: Progressing toward goals    Frequency    Min 2X/week      PT Plan Current plan remains appropriate    Co-evaluation              AM-PAC PT "6 Clicks" Mobility   Outcome Measure  Help needed turning from your back to your side while in a flat bed without using bedrails?: A Little Help needed moving from lying on your back to sitting on the side of a flat bed without using bedrails?: A Little Help needed moving to and from a bed to a chair (including a wheelchair)?: A Little Help needed standing up from a chair using your arms (e.g., wheelchair or bedside chair)?: A Little Help needed to walk in hospital room?: A Little Help needed climbing 3-5 steps with a railing? : A Lot 6 Click Score: 17    End of Session Equipment Utilized During Treatment: Gait belt Activity Tolerance: Patient tolerated treatment well Patient left: in chair;with call bell/phone within reach;with chair alarm set Nurse Communication: Mobility status PT Visit Diagnosis: Unsteadiness on feet (R26.81);Other abnormalities of gait and mobility (R26.89)     Time: 2197-5883 PT Time Calculation (min) (ACUTE ONLY): 23  min  Charges:  $Gait Training: 8-22 mins $Therapeutic Activity: 8-22 mins                     Candie Mile, PT, DPT Physical Therapist [facility] [place]    Shawna [name] 08/13/2022, 10:29 AM

## 2022-08-13 NOTE — Progress Notes (Signed)
  [name] Progress Note    Assessment/ Plan:   ESRD: -Outpatient orders: [facility], MWF. 3.5hrs. Nipro. 300 BFR. 2k/2,5cal. TDC. Mircera 134mg (last dose 08/01/22). EDW 59.5kg. Her left arm access has been thrombosed for 1-2 mths. She is catheter dependent for now; d/w her unit.  -HD on MWF schedule. Tolerated full treatment on 2/2 with 2L net UF.   Plan on next HD Mon on MWF regimen.  FTT -per primary service. Palliative consulted: full scope of care   Volume/ hypertension: EDW 59.5kg. Will UF as tolerated.   Anemia of Chronic Kidney Disease, symptomatic anemia: Hemoglobin 6.7 on presentation, transfuse PRN for hgb <7. Hb 8.5 Just received ESA as an outpatient, will redose accordingly.    Secondary Hyperparathyroidism/Hyperphosphatemia: resume home binders   Subjective:   No acute events overnight. S/p scope 2/1 and tolerated. Feeling a lot better today but poor appetite. Denies SOB/n/v.     Objective:   BP (!) 162/64 (BP Location: Right Arm)   Pulse 66   Temp 98.4 F (36.9 C) (Oral)   Resp 18   Ht '5\' 2"'$  (1.575 m)   Wt 54.1 kg   SpO2 100%   BMI 21.81 kg/m   Intake/Output Summary (Last 24 hours) at 08/13/2022 0730 Last data filed at 08/13/2022 04709Gross per 24 hour  Intake 440 ml  Output 2000 ml  Net -1560 ml   Weight change: -3 kg  Physical Exam: Gen: NAD CVS: RRR Resp: normal WOB Abd: soft, nt/nd Ext: tr pitting edema b/l Les Neuro: awake, interactive Dialysis access: Left chest TDC c/d/I, LUE AVG no bruit  Imaging: No results found.  Labs: BMET Recent Labs  Lab 08/07/22 1148 08/08/22 0911 08/10/22 0443  NA 133* 135 137  K 3.7 3.6 4.6  CL 96* 97* 95*  CO2 '22 25 23  '$ GLUCOSE 122* 127* 91  BUN 36* 57* 62*  CREATININE 5.33* 6.94* 6.32*  CALCIUM 9.0 8.3* 9.3  PHOS  --  4.8*  --    CBC Recent Labs  Lab 08/07/22 1148 08/08/22 0859 08/09/22 0559 08/10/22 0443  WBC 8.9 7.3 8.6 8.1  HGB 9.0* 7.9* 8.7* 8.5*  HCT 27.6* 24.2*  27.7* 25.5*  MCV 107.8* 109.0* 109.5* 107.1*  PLT 289 264 282 305    Medications:     (feeding supplement) PROSource Plus  30 mL Oral BID BM   sodium chloride   Intravenous Once   acetaminophen  500 mg Oral BID   amiodarone  100 mg Oral Daily   aspirin EC  81 mg Oral Daily   Chlorhexidine Gluconate Cloth  6 each Topical Q0600   diclofenac Sodium  2 g Topical BID   feeding supplement (NEPRO CARB STEADY)  237 mL Oral QG28Z  folic acid  1 mg Oral Daily   heparin  5,000 Units Subcutaneous Q8H   insulin aspart  0-6 Units Subcutaneous TID WC   levothyroxine  75 mcg Oral QAC breakfast   lisinopril  20 mg Oral Daily   metoprolol tartrate  12.5 mg Oral BID   multivitamin  1 tablet Oral Daily   omeprazole  20 mg Oral BID AC   peg 3350 powder  0.5 kit Oral Once   rosuvastatin  10 mg Oral Daily   sevelamer carbonate  2,400 mg Oral TID with meals   sodium chloride flush  3 mL Intravenous Q12H      JOtelia Santee MD [facility] 08/13/2022, 7:30 AM

## 2022-08-14 DIAGNOSIS — R627 Adult failure to thrive: Secondary | ICD-10-CM | POA: Diagnosis not present

## 2022-08-14 LAB — GLUCOSE, CAPILLARY
Glucose-Capillary: 105 mg/dL — ABNORMAL HIGH (ref 70–99)
Glucose-Capillary: 169 mg/dL — ABNORMAL HIGH (ref 70–99)
Glucose-Capillary: 117 mg/dL — ABNORMAL HIGH (ref 70–99)
Glucose-Capillary: 137 mg/dL — ABNORMAL HIGH (ref 70–99)

## 2022-08-14 NOTE — Progress Notes (Signed)
[facility] Progress Note    Outpatient orders: Javier Docker, MWF. 3.5hrs. Nipro. 300 BFR. 2k/2,5cal. TDC. Mircera 162mg (last dose 08/01/22). EDW 59.5kg. Her left arm access has been thrombosed for 1-2 mths. She is catheter dependent for now; d/w her unit.  Assessment/ Plan:   ESRD:  HD on MWF schedule. Tolerated full treatment on 2/2 with 2L net UF. EDW will need to be adjusted '@dc'$  but need a good weight on her.  Plan on next HD Mon on MWF regimen; no absolute indication for RRT and the patient appears to be  comfortable.  FTT -per primary service. Palliative consulted: full scope of care   Volume/ hypertension: EDW 59.5kg. Will UF as tolerated.   Anemia of Chronic Kidney Disease, symptomatic anemia: Hemoglobin 6.7 on presentation, transfuse PRN for hgb <7. Hb 8.5 Just received ESA as an outpatient, will redose accordingly.    Secondary Hyperparathyroidism/Hyperphosphatemia: resume home binders   Subjective:   No acute events overnight. S/p scope 2/1 and tolerated. Feeling better but still poor appetite. Denies SOB/n/v.  Has been out of bed to chair per nursing (pt did not remember)   Objective:   BP (!) 168/73 (BP Location: Right Arm)   Pulse 66   Temp 98.2 F (36.8 C) (Axillary)   Resp 16   Ht '5\' 2"'$  (1.575 m)   Wt 54.1 kg   SpO2 100%   BMI 21.81 kg/m   Intake/Output Summary (Last 24 hours) at 08/14/2022 [id number] data filed at 08/14/2022 0550 Gross per 24 hour  Intake 480 ml  Output 0 ml  Net 480 ml   Weight change:   Physical Exam: Gen: NAD CVS: RRR Resp: normal WOB Abd: soft, nt/nd Ext: tr pitting edema b/l Les Neuro: awake, interactive Dialysis access: Left chest TDC c/d/I, LUE AVG no bruit  Imaging: No results found.  Labs: BMET Recent Labs  Lab 08/07/22 1148 08/08/22 0911 08/10/22 0443  NA 133* 135 137  K 3.7 3.6 4.6  CL 96* 97* 95*  CO2 '22 25 23  '$ GLUCOSE 122* 127* 91  BUN 36* 57* 62*  CREATININE 5.33* 6.94* 6.32*  CALCIUM  9.0 8.3* 9.3  PHOS  --  4.8*  --    CBC Recent Labs  Lab 08/07/22 1148 08/08/22 0859 08/09/22 0559 08/10/22 0443  WBC 8.9 7.3 8.6 8.1  HGB 9.0* 7.9* 8.7* 8.5*  HCT 27.6* 24.2* 27.7* 25.5*  MCV 107.8* 109.0* 109.5* 107.1*  PLT 289 264 282 305    Medications:     (feeding supplement) PROSource Plus  30 mL Oral BID BM   sodium chloride   Intravenous Once   acetaminophen  500 mg Oral BID   amiodarone  100 mg Oral Daily   aspirin EC  81 mg Oral Daily   Chlorhexidine Gluconate Cloth  6 each Topical Q0600   diclofenac Sodium  2 g Topical BID   feeding supplement (NEPRO CARB STEADY)  237 mL Oral QJ47W  folic acid  1 mg Oral Daily   heparin  5,000 Units Subcutaneous Q8H   insulin aspart  0-6 Units Subcutaneous TID WC   levothyroxine  75 mcg Oral QAC breakfast   lisinopril  20 mg Oral Daily   metoprolol tartrate  12.5 mg Oral BID   multivitamin  1 tablet Oral Daily   omeprazole  20 mg Oral BID AC   peg 3350 powder  0.5 kit Oral Once   rosuvastatin  10 mg Oral Daily   sevelamer carbonate  2,400 mg Oral TID with meals   sodium chloride flush  3 mL Intravenous Q12H      Otelia Santee, MD [facility] 08/14/2022, 7:38 AM

## 2022-08-14 NOTE — Progress Notes (Signed)
Mobility Specialist Progress Note:   08/14/22 1020  Mobility  Activity Transferred from bed to chair  Level of Assistance Contact guard assist, steadying assist  Assistive Device Front wheel walker  Distance Ambulated (ft) 3 ft  Activity Response Tolerated well  Mobility Referral Yes  $Mobility charge 1 Mobility   Pt agreeable to mobility session. Required contact assist for safety. Pt left in chair with all needs met, chair alarm on.  Nelta Numbers Mobility Specialist Please contact via SecureChat or  Rehab office at [phone]

## 2022-08-14 NOTE — Progress Notes (Signed)
PROGRESS NOTE    Shawna [name]  QQI:[id number] DOB: 29-Jul-1940 DOA: 08/06/2022 PCP: Practice, [facility]   Brief Narrative:  82 year old with past medical history significant for ESRD on hemodialysis MWF, CAD status post CABG, chronic systolic heart failure, hyperlipidemia, hypertension, A-fib presented with generalized weakness.  Her husband reports patient been complaining of severe headache around the ear and down the neck, difficulty turning her neck, difficulty walking.  Symptoms started about a week ago.  She had a couple of shots in her neck on 1/19 and she was given a pill for it on 1/25.  It is eased some  but is still.  Patient missed HD.   Patient was last admitted 12/1-3 for anemia associated with ABLA from her fistula, not on AC despite A-fib.  She returned 12/8 with cough treated with antibiotics with in the terminated chest x-ray.  She returned 1/19 with right-sided headache, diagnosed with occipital neuralgia and treated with nerve block.  Return 1/25 with neck pain attributed to the same.    Admitted with anemia, FTT, worsening confusion. Plan for endoscopy/colonoscopy.   Assessment & Plan:   Principal Problem:   Failure to thrive in adult Active Problems:   HLD (hyperlipidemia)   Hx of CABG   ESRD (end stage renal disease) on dialysis (HCC)   Anemia associated with chronic renal failure   Paroxysmal Atrial fibrillation/Flutter   Benign neoplasm of transverse colon   Benign neoplasm of descending colon   Benign neoplasm of sigmoid colon   Angiodysplasia of duodenum  1-FTT: -Patient with h/o ESRD, she has missed a couple of recent sessions due to progressive weakness, difficulty ambulating. -She has been seen in the ER twice recently (1/19 and 1/25) for occipital neuralgia -Anemia, received blood transfusion.  -Palliative care consulted for goals of care, full scope of treatment -PT OT eval, needs SNF, TOC working on placement.   Encephalopathy acute on  chronic.  Delirium  Family report confusion since December, worse over last week  TSH 5.9-PTA Synthroid dose was 50 mcg, now increased to 75 mcg. B 12; 1,981.  Ammonia level normal.  Patient is fully alert and oriented to person and place.   ESRD on HD: Hemodialysis MWF, nephrology managing.  Acute on chronic anemia of chronic disease:  Anemia of CKD, presented with hemoglobin 6.7, posttransfusion improved to 9.0 and has remained stable since last couple of days.  Occult blood positive.  GI consulted, s/p EGD and colonoscopy 08/10/2022, duodenal and colonic polyps which were removed. 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    CAD:  Status post CABG Continue with aspirin and metoprolol.   HTN:  Pressure slightly elevated at times but is still acceptable, continue current dose of amlodipine, metoprolol and amiodarone.     A fib;  Her dialysis session was cut short today due to hypotension and tachycardia, she was in RVR, now she has been given her amiodarone as well as metoprolol and rates are controlled.  Patient has no symptoms. Not a candidate for [facility]   Hypothyroidism:  Continue with Synthroid   DM;  A1c: Continue with a sliding scale insulin   Headaches, Occipital neuralgia. Received nerve block Improved on schedule tylenol,  voltaren gel. , heating patch.   DVT prophylaxis: heparin injection 5,000 Units Start: 08/06/22 1300   Code Status: Full Code  Family Communication: none present at bedside.  Status is: Inpatient Remains inpatient appropriate because: Now medically stable, pending placement.   Estimated body mass index is 21.81 kg/m as calculated  from the following:   Height as of this encounter: '5\' 2"'$  (1.575 m).   Weight as of this encounter: 54.1 kg.    Nutritional Assessment: Body mass index is 21.81 kg/m.Marland Kitchen Seen by dietician.  I agree with the assessment and plan as outlined below: Nutrition Status: Nutrition Problem: Increased nutrient needs Etiology: chronic  illness Signs/Symptoms: estimated needs Interventions: Nepro shake, MVI, Prostat  . Skin Assessment: I have examined the patient's skin and I agree with the wound assessment as performed by the wound care RN as outlined below:    Consultants:  GI and nephrology  Procedures:  As above  Antimicrobials:  Anti-infectives (From admission, onward)    None         Subjective:  Seen and examined.  She has no complaints.  Objective: Vitals:   08/13/22 1629 08/13/22 2118 08/14/22 0550 08/14/22 0913  BP: (!) 144/63 (!) 172/61 (!) 168/73 (!) 158/67  Pulse: 62 72 66 65  Resp: '18 18 16 18  '$ Temp: 98 F (36.7 C) 98 F (36.7 C) 98.2 F (36.8 C) 98 F (36.7 C)  TempSrc:   Axillary Oral  SpO2: 98% 100% 100% 100%  Weight:      Height:        Intake/Output Summary (Last 24 hours) at 08/14/2022 1022 Last data filed at 08/14/2022 0900 Gross per 24 hour  Intake 600 ml  Output 0 ml  Net 600 ml    Filed Weights   08/11/22 0734 08/12/22 1223 08/12/22 1615  Weight: 59 kg 56 kg 54.1 kg    Examination:  General exam: Appears calm and comfortable  Respiratory system: Clear to auscultation. Respiratory effort normal. Cardiovascular system: S1 & S2 heard, RRR. No JVD, murmurs, rubs, gallops or clicks. No pedal edema. Gastrointestinal system: Abdomen is nondistended, soft and nontender. No organomegaly or masses felt. Normal bowel sounds heard. Central nervous system: Alert and oriented x 1. No focal neurological deficits. Extremities: Symmetric 5 x 5 power. Skin: No rashes, lesions or ulcers.  Psychiatry: Judgement and insight appear poor.   Data Reviewed: I have personally reviewed following labs and imaging studies  CBC: Recent Labs  Lab 08/07/22 1148 08/08/22 0859 08/09/22 0559 08/10/22 0443  WBC 8.9 7.3 8.6 8.1  HGB 9.0* 7.9* 8.7* 8.5*  HCT 27.6* 24.2* 27.7* 25.5*  MCV 107.8* 109.0* 109.5* 107.1*  PLT 289 264 282 740    Basic Metabolic Panel: Recent Labs  Lab  08/07/22 1148 08/08/22 0911 08/10/22 0443  NA 133* 135 137  K 3.7 3.6 4.6  CL 96* 97* 95*  CO2 '22 25 23  '$ GLUCOSE 122* 127* 91  BUN 36* 57* 62*  CREATININE 5.33* 6.94* 6.32*  CALCIUM 9.0 8.3* 9.3  PHOS  --  4.8*  --     GFR: Estimated Creatinine Clearance: 5.4 mL/min (A) (by C-G formula based on SCr of 6.32 mg/dL (H)). Liver Function Tests: Recent Labs  Lab 08/08/22 0911  ALBUMIN 2.4*    No results for input(s): "LIPASE", "AMYLASE" in the last 168 hours. Recent Labs  Lab 08/10/22 0443  AMMONIA 31    Coagulation Profile: No results for input(s): "INR", "PROTIME" in the last 168 hours. Cardiac Enzymes: No results for input(s): "CKTOTAL", "CKMB", "CKMBINDEX", "TROPONINI" in the last 168 hours. BNP (last 3 results) No results for input(s): "PROBNP" in the last 8760 hours. HbA1C: No results for input(s): "HGBA1C" in the last 72 hours.  CBG: Recent Labs  Lab 08/13/22 0728 08/13/22 1121 08/13/22 1611 08/13/22 2117  08/14/22 0722  GLUCAP 102* 185* 91 156* 105*    Lipid Profile: No results for input(s): "CHOL", "HDL", "LDLCALC", "TRIG", "CHOLHDL", "LDLDIRECT" in the last 72 hours. Thyroid Function Tests: No results for input(s): "TSH", "T4TOTAL", "FREET4", "T3FREE", "THYROIDAB" in the last 72 hours.  Anemia Panel: No results for input(s): "VITAMINB12", "FOLATE", "FERRITIN", "TIBC", "IRON", "RETICCTPCT" in the last 72 hours.  Sepsis Labs: No results for input(s): "PROCALCITON", "LATICACIDVEN" in the last 168 hours.  No results found for this or any previous visit (from the past 240 hour(s)).   Radiology Studies: No results found.  Scheduled Meds:  (feeding supplement) PROSource Plus  30 mL Oral BID BM   sodium chloride   Intravenous Once   acetaminophen  500 mg Oral BID   amiodarone  100 mg Oral Daily   aspirin EC  81 mg Oral Daily   Chlorhexidine Gluconate Cloth  6 each Topical Q0600   diclofenac Sodium  2 g Topical BID   feeding supplement (NEPRO CARB  STEADY)  237 mL Oral Z61W   folic acid  1 mg Oral Daily   heparin  5,000 Units Subcutaneous Q8H   insulin aspart  0-6 Units Subcutaneous TID WC   levothyroxine  75 mcg Oral QAC breakfast   lisinopril  20 mg Oral Daily   metoprolol tartrate  12.5 mg Oral BID   multivitamin  1 tablet Oral Daily   omeprazole  20 mg Oral BID AC   peg 3350 powder  0.5 kit Oral Once   rosuvastatin  10 mg Oral Daily   sevelamer carbonate  2,400 mg Oral TID with meals   sodium chloride flush  3 mL Intravenous Q12H   Continuous Infusions:  anticoagulant sodium citrate       LOS: 8 days   Darliss Cheney, MD [facility]  08/14/2022, 10:22 AM   *Please note that this is a verbal dictation therefore any spelling or grammatical errors are due to the "[employer]" system interpretation.  Please page via [place] and do not message via secure chat for urgent patient care matters. Secure chat can be used for non urgent patient care matters.  How to contact the [facility] Attending or Consulting provider [place] or covering provider during after hours [place], for this patient?  Check the care team in [facility] and look for a) attending/consulting TRH provider listed and b) the [facility] team listed. Page or secure chat [phone]. Log into [email or web address] and use [place]'s universal password to access. If you do not have the password, please contact the hospital operator. Locate the [facility] provider you are looking for under [facility] and page to a number that you can be directly reached. If you still have difficulty reaching the provider, please page the [facility] (Director on Call) for the Hospitalists listed on amion for assistance.

## 2022-08-15 DIAGNOSIS — R627 Adult failure to thrive: Secondary | ICD-10-CM | POA: Diagnosis not present

## 2022-08-15 LAB — BASIC METABOLIC PANEL
Anion gap: 17 — ABNORMAL HIGH (ref 5–15)
BUN: 70 mg/dL — ABNORMAL HIGH (ref 8–23)
CO2: 24 mmol/L (ref 22–32)
Calcium: 9.6 mg/dL (ref 8.9–10.3)
Chloride: 91 mmol/L — ABNORMAL LOW (ref 98–111)
Creatinine, Ser: 8.73 mg/dL — ABNORMAL HIGH (ref 0.44–1.00)
GFR, Estimated: 4 mL/min — ABNORMAL LOW (ref >60)
Glucose, Bld: 112 mg/dL — ABNORMAL HIGH (ref 70–99)
Potassium: 4.3 mmol/L (ref 3.5–5.1)
Sodium: 132 mmol/L — ABNORMAL LOW (ref 135–145)

## 2022-08-15 LAB — CBC WITH DIFFERENTIAL/PLATELET
Abs Immature Granulocytes: 0 10*3/uL (ref 0.00–0.07)
Basophils Absolute: 0.1 10*3/uL (ref 0.0–0.1)
Basophils Relative: 1 %
Eosinophils Absolute: 0.3 10*3/uL (ref 0.0–0.5)
Eosinophils Relative: 4 %
HCT: 24.2 % — ABNORMAL LOW (ref 36.0–46.0)
Hemoglobin: 8.2 g/dL — ABNORMAL LOW (ref 12.0–15.0)
Lymphocytes Relative: 6 %
Lymphs Abs: 0.5 10*3/uL — ABNORMAL LOW (ref 0.7–4.0)
MCH: 36.6 pg — ABNORMAL HIGH (ref 26.0–34.0)
MCHC: 33.9 g/dL (ref 30.0–36.0)
MCV: 108 fL — ABNORMAL HIGH (ref 80.0–100.0)
Monocytes Absolute: 0.5 10*3/uL (ref 0.1–1.0)
Monocytes Relative: 6 %
Neutro Abs: 7.1 10*3/uL (ref 1.7–7.7)
Neutrophils Relative %: 83 %
Platelets: 326 10*3/uL (ref 150–400)
RBC: 2.24 MIL/uL — ABNORMAL LOW (ref 3.87–5.11)
RDW: 21.4 % — ABNORMAL HIGH (ref 11.5–15.5)
WBC: 8.6 10*3/uL (ref 4.0–10.5)
nRBC: 0 % (ref 0.0–0.2)
nRBC: 0 /100 WBC

## 2022-08-15 LAB — GLUCOSE, CAPILLARY
Glucose-Capillary: 101 mg/dL — ABNORMAL HIGH (ref 70–99)
Glucose-Capillary: 104 mg/dL — ABNORMAL HIGH (ref 70–99)
Glucose-Capillary: 118 mg/dL — ABNORMAL HIGH (ref 70–99)
Glucose-Capillary: 103 mg/dL — ABNORMAL HIGH (ref 70–99)

## 2022-08-15 MED ORDER — HEPARIN SODIUM (PORCINE) 1000 UNIT/ML IJ SOLN
INTRAMUSCULAR | Status: AC
  Administered 2022-08-15: 3800 [IU]
  Filled 2022-08-15: qty 4

## 2022-08-15 NOTE — Progress Notes (Signed)
   08/15/22 1641  Pain Assessment  Pain Scale 0-10  Neurological  Level of Consciousness Alert  Orientation Level Oriented to place;Oriented to person  Respiratory  Respiratory Pattern Regular;Unlabored  Chest Assessment Chest expansion symmetrical  Bilateral Breath Sounds Clear;Diminished  Cardiac  Cardiac Rhythm NSR  Psychosocial  Patient Behaviors Cooperative;Calm

## 2022-08-15 NOTE — TOC Progression Note (Signed)
Transition of Care [facility]) - Initial/Assessment Note    Patient Details  Name: Shawna [name] MRN: [id number] Date of Birth: [birth year]  [facility] CM/SW Contact:    Milinda Antis, [place] Phone Number: 08/15/2022, 10:13 AM  Clinical Narrative:                 LCSW received a call from [facility] (SNF).  The facility reports that when they attempted to get insurance auth, it was found that the facility is not in network with the patient's insurance because the insurance plan is from [place].  The patient has no other bed offers.  LCSW expanded SNF search.  Expected Discharge Plan: Skilled Nursing Facility Barriers to Discharge: Continued Medical Work up, Ship broker, SNF Pending bed offer   Patient Goals and CMS Choice   CMS Medicare.gov Compare Post Acute Care list provided to:: Patient Represenative (must comment) Choice offered to / list presented to : Spouse, Adult Children      Expected Discharge Plan and Services In-house Referral: Clinical Social Work     Living arrangements for the past 2 months: Single Family Home                                      Prior Living Arrangements/Services Living arrangements for the past 2 months: Single Family Home Lives with:: Spouse Patient language and need for interpreter reviewed:: Yes Do you feel safe going back to the place where you live?: Yes      Need for Family Participation in Patient Care: Yes (Comment) Care giver support system in place?: Yes (comment)   Criminal Activity/Legal Involvement Pertinent to Current Situation/Hospitalization: No - Comment as needed  Activities of Daily Living Home Assistive Devices/Equipment: None ADL Screening (condition at time of admission) Patient's cognitive ability adequate to safely complete daily activities?: Yes Is the patient deaf or have difficulty hearing?: No Does the patient have difficulty seeing, even when wearing glasses/contacts?:  No Does the patient have difficulty concentrating, remembering, or making decisions?: Yes Patient able to express need for assistance with ADLs?: Yes Does the patient have difficulty dressing or bathing?: Yes Independently performs ADLs?: No Does the patient have difficulty walking or climbing stairs?: Yes Weakness of Legs: Both Weakness of Arms/Hands: None  Permission Sought/Granted   Permission granted to share information with : Yes, Verbal Permission Granted (patient disoriented, permission given via spouse)     Permission granted to share info w AGENCY: SNFs        Emotional Assessment       Orientation: : Oriented to Self Alcohol / Substance Use: Not Applicable Psych Involvement: No (comment)  Admission diagnosis:  Generalized weakness [R53.1] Symptomatic anemia [D64.9] Patient Active Problem List   Diagnosis Date Noted   Benign neoplasm of transverse colon 08/11/2022   Benign neoplasm of descending colon 08/11/2022   Benign neoplasm of sigmoid colon 08/11/2022   Angiodysplasia of duodenum 08/11/2022   Delirium 04/22/2022   History of CAD (coronary artery disease) 04/22/2022   NSTEMI, initial episode of care ([place]) 10/19/2021   Symptomatic anemia 09/09/2021   Ischemic cardiomyopathy    Paroxysmal Atrial fibrillation/Flutter 05/07/2021   Neuropathy 04/23/2021   Anemia associated with chronic renal failure 04/13/2021   Left knee pain 03/15/2021   Moderate protein-calorie malnutrition ([place]) 02/27/2021   COVID-19 virus infection 02/03/2021   Leukocytosis 02/03/2021   Hypothyroidism (acquired) 02/03/2021  Myositis 12/03/2020   Ataxia 12/02/2020   Diabetes mellitus type 2 in nonobese ([place]) 11/10/2020   Failure to thrive in adult 11/09/2020   Dialysis AV fistula malfunction, initial encounter ([place])    Jugular vein occlusion, right ([place])    Failure of surgically constructed arteriovenous fistula ([place]) 10/03/2020   Myoclonus 08/31/2020   Clotted renal dialysis AV  graft, initial encounter ([place])    Hemodialysis-associated hypotension    Irritable bowel syndrome 02/25/2020   Adenomatous duodenal polyp 09/10/2019   History of GI bleed 09/10/2019   Hand steal syndrome ([place]) 08/01/2017   Coronary artery disease 41/96/2229   Complication of vascular access for dialysis 03/19/2017   Preoperative clearance 01/25/2017   H/O non-ST elevation myocardial infarction (NSTEMI) 10/24/2016   Non-ST elevation (NSTEMI) myocardial infarction [facility])    Cardiac arrest [facility])    Palliative care encounter    Goals of care, counseling/discussion    Flash pulmonary edema ([place]) 04/06/2016   History of colon cancer 01/27/2016   History of ovarian cancer 01/27/2016   PAF (paroxysmal atrial fibrillation) ([place]) 10/14/2015   Malignant neoplasm of right ovary ([place]) 10/14/2015   SVT (supraventricular tachycardia) 09/08/2015   Dyspnea    Occlusion and stenosis of carotid artery without mention of cerebral infarction 01/24/2013   Hx of CABG 07/05/2012   Carotid artery disease ([place]) 07/05/2012   Mitral regurgitation 06/12/2012   Non-STEMI (non-ST elevated myocardial infarction) ([place]) 06/08/2012   Chronic combined systolic and diastolic CHF (congestive heart failure) ([place]) 05/02/2012   AVM (arteriovenous malformation) of small bowel, acquired 01/20/2012   GERD (gastroesophageal reflux disease) 01/09/2012   HLD (hyperlipidemia) 01/05/2012   Atherosclerotic heart disease of native coronary artery without angina pectoris 12/16/2011   Anxiety disorder 05/04/2011   ESRD (end stage renal disease) on dialysis ([place]) 04/29/2011   Gout 04/29/2011   PCP:  Practice, [place]:   [facility] [address] [place] [address] Phone: [phone] Fax: [phone]  [facility] (ESRD Bundle Only) - [place], [address] [place] [address] Phone: [phone] Fax: [phone]     Social  Determinants of Health ([place]) Social History: [place]: No Food Insecurity (08/06/2022)  Housing: Low Risk  (08/06/2022)  Transportation Needs: No Transportation Needs (08/06/2022)  Utilities: Not At Risk (08/06/2022)  Financial Resource Strain: Low Risk  (12/14/2018)  Physical Activity: Unknown (12/14/2018)  Social Connections: Unknown (12/14/2018)  Stress: No Stress Concern Present (12/14/2018)  Tobacco Use: Low Risk  (08/11/2022)   SDOH Interventions:     Readmission Risk Interventions    11/12/2021    8:56 AM 10/20/2021    4:38 PM 07/23/2021    4:26 PM  Readmission Risk Prevention Plan  Transportation Screening Complete Complete Complete  Medication Review (RN Care Manager) Complete Complete Complete  PCP or Specialist appointment within 3-5 days of discharge Complete Complete Complete  HRI or Home Care Consult Complete Complete Complete  SW Recovery Care/Counseling Consult Complete Complete Complete  Palliative Care Screening Not Applicable Not Applicable Not Applicable  Skilled Nursing Facility Complete Not Applicable Not Applicable

## 2022-08-15 NOTE — Progress Notes (Signed)
   08/15/22 1641  Vitals  Temp 98.6 F (37 C)  Temp Source Oral  BP (!) 162/58  BP Location Right Arm  BP Method Automatic  Patient Position (if appropriate) Lying  Pulse Rate Source Monitor  Level of Consciousness  Level of Consciousness Alert  MEWS COLOR  MEWS Score Color Green  Oxygen Therapy  O2 Device Room Air  Pain Assessment  Pain Scale 0-10  PCA/Epidural/Spinal Assessment  Respiratory Pattern Regular;Unlabored  ECG Monitoring  Cardiac Rhythm NSR  MEWS Score  MEWS Temp 0  MEWS Systolic 0  MEWS Pulse 0  MEWS RR 0  MEWS LOC 0  MEWS Score 0   Received patient in bed to unit.  Alert and oriented.  Informed consent signed and in chart.   TX duration:  Patient tolerated well.  Transported back to the room  Alert, without acute distress.  Hand-off given to patient's nurse.   Access used: Yes Access issues: No  Total UF removed: 900L Medication(s) given: Zofran '4mg'$  Post HD VS: See Above Post HD weight: 55.0 kg   [facility]

## 2022-08-15 NOTE — Progress Notes (Signed)
   08/15/22 1641  Vitals  Temp 98.6 F (37 C)  Temp Source Oral  BP (!) 162/58  BP Location Right Arm  BP Method Automatic  Patient Position (if appropriate) Lying  Pulse Rate Source Monitor  Level of Consciousness  Level of Consciousness Alert  MEWS COLOR  MEWS Score Color Green  Oxygen Therapy  O2 Device Room Air  Pain Assessment  Pain Scale 0-10  PCA/Epidural/Spinal Assessment  Respiratory Pattern Regular;Unlabored  ECG Monitoring  Cardiac Rhythm NSR  MEWS Score  MEWS Temp 0  MEWS Systolic 0  MEWS Pulse 0  MEWS RR 0  MEWS LOC 0  MEWS Score 0

## 2022-08-15 NOTE — Progress Notes (Signed)
Mobility Specialist Progress Note   08/15/22 1208  Mobility  Activity Ambulated with assistance in hallway;Transferred from bed to chair  Level of Assistance Minimal assist, patient does 75% or more  Assistive Device Front wheel walker  Distance Ambulated (ft) 32 ft  Activity Response Tolerated well  Mobility Referral Yes  $Mobility charge 1 Mobility   Pt agreeable to mobility. Pt c/o slight headache before session. Session limited to pt's confusion, required max cues to keep pt goal oriented + max cues on proximity from RW throughout ambulation. Returned back to room d/t expressed fatigue and placed in chair w/ call bell in reach and chair alarm on.    [place] Falling Mobility Specialist Please contact via SecureChat or  Rehab office at [phone]

## 2022-08-15 NOTE — Progress Notes (Signed)
  [facility] Progress Note    Outpatient orders: Javier Docker, MWF. 3.5hrs. Nipro. 300 BFR. 2k/2,5cal. TDC. Mircera 143mg (last dose 08/01/22). EDW 59.5kg. Her left arm access has been thrombosed for 1-2 mths. She is catheter dependent for now; d/w her unit.  Assessment/ Plan:   ESRD:  HD on MWF schedule. Tolerated full treatment on 2/2 with 2L net UF.  [facility]    FTT -per primary service. Palliative consulted: full scope of care   Volume/ hypertension: EDW 59.5kg. Will UF as tolerated. 2L as tol today.    Anemia of Chronic Kidney Disease, symptomatic anemia: Hemoglobin 6.7 on presentation, transfuse PRN for hgb <7. Hb 8.5 > 8.2. Just received ESA as an outpatient, will redose accordingly.    Secondary Hyperparathyroidism/Hyperphosphatemia: cont home binders   Subjective:   Seen in room after eating good lunch and getting ready for dialysis.  No new complaints.     Objective:   BP (!) 187/70 (BP Location: Right Arm)   Pulse 66   Temp 97.8 F (36.6 C) (Oral)   Resp 18   Ht '5\' 2"'$  (1.575 m)   Wt 57.1 kg   SpO2 97%   BMI 23.02 kg/m   Intake/Output Summary (Last 24 hours) at 08/15/2022 1212 Last data filed at 08/15/2022 01497Gross per 24 hour  Intake 360 ml  Output 0 ml  Net 360 ml    Weight change:   Physical Exam: Gen: NAD CVS: RRR Resp: normal WOB Abd: soft, nt/nd Ext: tr pitting edema b/l Les Neuro: awake, interactive Dialysis access: Left chest TDC c/d/I, LUE AVG no bruit   Imaging: No results found.  Labs: BMET Recent Labs  Lab 08/10/22 0443  NA 137  K 4.6  CL 95*  CO2 23  GLUCOSE 91  BUN 62*  CREATININE 6.32*  CALCIUM 9.3    CBC Recent Labs  Lab 08/09/22 0559 08/10/22 0443 08/15/22 0833  WBC 8.6 8.1 8.6  NEUTROABS  --   --  7.1  HGB 8.7* 8.5* 8.2*  HCT 27.7* 25.5* 24.2*  MCV 109.5* 107.1* 108.0*  PLT 282 305 326     Medications:     (feeding supplement) PROSource Plus  30 mL Oral BID BM   sodium chloride    Intravenous Once   acetaminophen  500 mg Oral BID   amiodarone  100 mg Oral Daily   aspirin EC  81 mg Oral Daily   Chlorhexidine Gluconate Cloth  6 each Topical Q0600   diclofenac Sodium  2 g Topical BID   feeding supplement (NEPRO CARB STEADY)  237 mL Oral QW26V  folic acid  1 mg Oral Daily   heparin  5,000 Units Subcutaneous Q8H   insulin aspart  0-6 Units Subcutaneous TID WC   levothyroxine  75 mcg Oral QAC breakfast   lisinopril  20 mg Oral Daily   metoprolol tartrate  12.5 mg Oral BID   multivitamin  1 tablet Oral Daily   omeprazole  20 mg Oral BID AC   peg 3350 powder  0.5 kit Oral Once   rosuvastatin  10 mg Oral Daily   sevelamer carbonate  2,400 mg Oral TID with meals   sodium chloride flush  3 mL Intravenous Q12H    LJannifer HickMD [facility] Pager [phone]

## 2022-08-15 NOTE — Progress Notes (Signed)
PROGRESS NOTE    Shawna [name]  TOI:[id number] DOB: September 27, 1940 DOA: 08/06/2022 PCP: [facility]   Brief Narrative:  82 year old with past medical history significant for ESRD on hemodialysis MWF, CAD status post CABG, chronic systolic heart failure, hyperlipidemia, hypertension, A-fib presented with generalized weakness.  Her husband reports patient been complaining of severe headache around the ear and down the neck, difficulty turning her neck, difficulty walking.  Symptoms started about a week ago.  She had a couple of shots in her neck on 1/19 and she was given a pill for it on 1/25.  It is eased some  but is still.  Patient missed HD.   Patient was last admitted 12/1-3 for anemia associated with ABLA from her fistula, not on AC despite A-fib.  She returned 12/8 with cough treated with antibiotics with in the terminated chest x-ray.  She returned 1/19 with right-sided headache, diagnosed with occipital neuralgia and treated with nerve block.  Return 1/25 with neck pain attributed to the same.    Admitted with anemia, FTT, worsening confusion. Plan for endoscopy/colonoscopy.   Assessment & Plan:   Principal Problem:   Failure to thrive in adult Active Problems:   HLD (hyperlipidemia)   Hx of CABG   ESRD (end stage renal disease) on dialysis (HCC)   Anemia associated with chronic renal failure   Paroxysmal Atrial fibrillation/Flutter   Benign neoplasm of transverse colon   Benign neoplasm of descending colon   Benign neoplasm of sigmoid colon   Angiodysplasia of duodenum  1-FTT: -Patient with h/o ESRD, she has missed a couple of recent sessions due to progressive weakness, difficulty ambulating. -She has been seen in the ER twice recently (1/19 and 1/25) for occipital neuralgia -Anemia, received blood transfusion.  -Palliative care consulted for goals of care, full scope of treatment -PT OT eval, needs SNF, TOC working on placement.   Encephalopathy acute on  chronic.  Delirium  Family report confusion since December, worse over last week  TSH 5.9-PTA Synthroid dose was 50 mcg, now increased to 75 mcg. B 12; 1,981.  Ammonia level normal.  Patient is fully alert and oriented to person and place.   ESRD on HD: Hemodialysis MWF, nephrology managing.  Acute on chronic anemia of chronic disease:  Anemia of CKD, presented with hemoglobin 6.7, posttransfusion improved to 9.0 and has remained stable since last couple of days.  Occult blood positive.  GI consulted, s/p EGD and colonoscopy 08/10/2022, duodenal and colonic polyps which were removed. 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    CAD:  Status post CABG Continue with aspirin and metoprolol.   HTN:  Pressure slightly elevated at times but is still acceptable, continue current dose of lisinopril, metoprolol and amiodarone.     A fib;  Her dialysis session was cut short today due to hypotension and tachycardia, she was in RVR, now she has been given her amiodarone as well as metoprolol and rates are controlled.  Patient has no symptoms. Not a candidate for [facility]   Hypothyroidism:  Continue with Synthroid   DM;  A1c: Continue with a sliding scale insulin   Headaches, Occipital neuralgia. Received nerve block Improved on schedule tylenol,  voltaren gel. , heating patch.   DVT prophylaxis: heparin injection 5,000 Units Start: 08/06/22 1300   Code Status: Full Code  Family Communication: none present at bedside.  Status is: Inpatient Remains inpatient appropriate because: Now medically stable, pending placement.   Estimated body mass index is 23.02 kg/m as calculated  from the following:   Height as of this encounter: '5\' 2"'$  (1.575 m).   Weight as of this encounter: 57.1 kg.    Nutritional Assessment: Body mass index is 23.02 kg/m.Marland Kitchen Seen by dietician.  I agree with the assessment and plan as outlined below: Nutrition Status: Nutrition Problem: Increased nutrient needs Etiology: chronic  illness Signs/Symptoms: estimated needs Interventions: Nepro shake, MVI, Prostat  . Skin Assessment: I have examined the patient's skin and I agree with the wound assessment as performed by the wound care RN as outlined below:    Consultants:  GI and nephrology  Procedures:  As above  Antimicrobials:  Anti-infectives (From admission, onward)    None         Subjective:  Patient seen and examined.  She has no complaints.  Objective: Vitals:   08/14/22 2109 08/15/22 0500 08/15/22 0526 08/15/22 0915  BP: 110/86  (!) 177/57 (!) 187/70  Pulse: 63  63 66  Resp: 18  18   Temp: 97.9 F (36.6 C)  97.7 F (36.5 C) 97.8 F (36.6 C)  TempSrc: Oral  Oral Oral  SpO2: 97%  100% 97%  Weight:  57.1 kg    Height:        Intake/Output Summary (Last 24 hours) at 08/15/2022 1202 Last data filed at 08/15/2022 5638 Gross per 24 hour  Intake 360 ml  Output 0 ml  Net 360 ml    Filed Weights   08/12/22 1223 08/12/22 1615 08/15/22 0500  Weight: 56 kg 54.1 kg 57.1 kg    Examination:  General exam: Appears calm and comfortable  Respiratory system: Clear to auscultation. Respiratory effort normal. Cardiovascular system: S1 & S2 heard, RRR. No JVD, murmurs, rubs, gallops or clicks. No pedal edema. Gastrointestinal system: Abdomen is nondistended, soft and nontender. No organomegaly or masses felt. Normal bowel sounds heard. Central nervous system: Alert and oriented x 1. No focal neurological deficits. Extremities: Symmetric 5 x 5 power. Skin: No rashes, lesions or ulcers.    Data Reviewed: I have personally reviewed following labs and imaging studies  CBC: Recent Labs  Lab 08/09/22 0559 08/10/22 0443 08/15/22 0833  WBC 8.6 8.1 8.6  NEUTROABS  --   --  7.1  HGB 8.7* 8.5* 8.2*  HCT 27.7* 25.5* 24.2*  MCV 109.5* 107.1* 108.0*  PLT 282 305 756    Basic Metabolic Panel: Recent Labs  Lab 08/10/22 0443  NA 137  K 4.6  CL 95*  CO2 23  GLUCOSE 91  BUN 62*   CREATININE 6.32*  CALCIUM 9.3    GFR: Estimated Creatinine Clearance: 5.4 mL/min (A) (by C-G formula based on SCr of 6.32 mg/dL (H)). Liver Function Tests: No results for input(s): "AST", "ALT", "ALKPHOS", "BILITOT", "PROT", "ALBUMIN" in the last 168 hours.  No results for input(s): "LIPASE", "AMYLASE" in the last 168 hours. Recent Labs  Lab 08/10/22 0443  AMMONIA 31    Coagulation Profile: No results for input(s): "INR", "PROTIME" in the last 168 hours. Cardiac Enzymes: No results for input(s): "CKTOTAL", "CKMB", "CKMBINDEX", "TROPONINI" in the last 168 hours. BNP (last 3 results) No results for input(s): "PROBNP" in the last 8760 hours. HbA1C: No results for input(s): "HGBA1C" in the last 72 hours.  CBG: Recent Labs  Lab 08/14/22 1138 08/14/22 1617 08/14/22 2108 08/15/22 0720 08/15/22 1120  GLUCAP 169* 117* 137* 101* 104*    Lipid Profile: No results for input(s): "CHOL", "HDL", "LDLCALC", "TRIG", "CHOLHDL", "LDLDIRECT" in the last 72 hours. Thyroid Function Tests:  No results for input(s): "TSH", "T4TOTAL", "FREET4", "T3FREE", "THYROIDAB" in the last 72 hours.  Anemia Panel: No results for input(s): "VITAMINB12", "FOLATE", "FERRITIN", "TIBC", "IRON", "RETICCTPCT" in the last 72 hours.  Sepsis Labs: No results for input(s): "PROCALCITON", "LATICACIDVEN" in the last 168 hours.  No results found for this or any previous visit (from the past 240 hour(s)).   Radiology Studies: No results found.  Scheduled Meds:  (feeding supplement) PROSource Plus  30 mL Oral BID BM   sodium chloride   Intravenous Once   acetaminophen  500 mg Oral BID   amiodarone  100 mg Oral Daily   aspirin EC  81 mg Oral Daily   Chlorhexidine Gluconate Cloth  6 each Topical Q0600   diclofenac Sodium  2 g Topical BID   feeding supplement (NEPRO CARB STEADY)  237 mL Oral P50D   folic acid  1 mg Oral Daily   heparin  5,000 Units Subcutaneous Q8H   insulin aspart  0-6 Units Subcutaneous  TID WC   levothyroxine  75 mcg Oral QAC breakfast   lisinopril  20 mg Oral Daily   metoprolol tartrate  12.5 mg Oral BID   multivitamin  1 tablet Oral Daily   omeprazole  20 mg Oral BID AC   peg 3350 powder  0.5 kit Oral Once   rosuvastatin  10 mg Oral Daily   sevelamer carbonate  2,400 mg Oral TID with meals   sodium chloride flush  3 mL Intravenous Q12H   Continuous Infusions:  anticoagulant sodium citrate       LOS: 9 days   Darliss Cheney, MD [facility]  08/15/2022, 12:02 PM   *Please note that this is a verbal dictation therefore any spelling or grammatical errors are due to the "[employer]" system interpretation.  Please page via [place] and do not message via secure chat for urgent patient care matters. Secure chat can be used for non urgent patient care matters.  How to contact the [facility] Attending or Consulting provider [place] or covering provider during after hours [place], for this patient?  Check the care team in [facility] and look for a) attending/consulting TRH provider listed and b) the [facility] team listed. Page or secure chat [phone]. Log into [email or web address] and use [place] universal password to access. If you do not have the password, please contact the hospital operator. Locate the [facility] provider you are looking for under [facility] and page to a number that you can be directly reached. If you still have difficulty reaching the provider, please page the [facility] (Director on Call) for the Hospitalists listed on amion for assistance.

## 2022-08-16 DIAGNOSIS — R627 Adult failure to thrive: Secondary | ICD-10-CM | POA: Diagnosis not present

## 2022-08-16 LAB — CBC WITH DIFFERENTIAL/PLATELET
Abs Immature Granulocytes: 0.34 10*3/uL — ABNORMAL HIGH (ref 0.00–0.07)
Basophils Absolute: 0.1 10*3/uL (ref 0.0–0.1)
Basophils Relative: 1 %
Eosinophils Absolute: 0.2 10*3/uL (ref 0.0–0.5)
Eosinophils Relative: 3 %
HCT: 26 % — ABNORMAL LOW (ref 36.0–46.0)
Hemoglobin: 8.4 g/dL — ABNORMAL LOW (ref 12.0–15.0)
Immature Granulocytes: 4 %
Lymphocytes Relative: 7 %
Lymphs Abs: 0.6 10*3/uL — ABNORMAL LOW (ref 0.7–4.0)
MCH: 35.4 pg — ABNORMAL HIGH (ref 26.0–34.0)
MCHC: 32.3 g/dL (ref 30.0–36.0)
MCV: 109.7 fL — ABNORMAL HIGH (ref 80.0–100.0)
Monocytes Absolute: 0.8 10*3/uL (ref 0.1–1.0)
Monocytes Relative: 9 %
Neutro Abs: 6.7 10*3/uL (ref 1.7–7.7)
Neutrophils Relative %: 76 %
Platelets: ADEQUATE 10*3/uL (ref 150–400)
RBC: 2.37 MIL/uL — ABNORMAL LOW (ref 3.87–5.11)
RDW: 22 % — ABNORMAL HIGH (ref 11.5–15.5)
WBC: 8.8 10*3/uL (ref 4.0–10.5)
nRBC: 0.7 % — ABNORMAL HIGH (ref 0.0–0.2)

## 2022-08-16 LAB — GLUCOSE, CAPILLARY
Glucose-Capillary: 104 mg/dL — ABNORMAL HIGH (ref 70–99)
Glucose-Capillary: 135 mg/dL — ABNORMAL HIGH (ref 70–99)
Glucose-Capillary: 143 mg/dL — ABNORMAL HIGH (ref 70–99)
Glucose-Capillary: 93 mg/dL (ref 70–99)

## 2022-08-16 NOTE — Progress Notes (Signed)
Occupational Therapy Treatment Patient Details Name: Shawna [name] MRN: [id number] DOB: [birth year] Today's Date: 08/16/2022   History of present illness 82 year old presented with generalized weakness, difficulty walking, severe HA, difficulty turning her neck; Symptoms started about a week prior to this admission; with past medical history significant for ESRD on hemodialysis MWF, CAD status post CABG, chronic systolic heart failure, hyperlipidemia, hypertension, A-fib   OT comments  Pt progressing towards goals this session, completing seated ADLs with min-mod A, min guard A for bed mobility, and min A for transfers using RW. Pt needing max cues for sequencing, restless and perseverates on folding gown and blanket during session. Pt presenting with impairments listed below, will follow acutely. Continue to recommend SNF at d/c.   Recommendations for follow up therapy are one component of a multi-disciplinary discharge planning process, led by the attending physician.  Recommendations may be updated based on patient status, additional functional criteria and insurance authorization.    Follow Up Recommendations  Skilled nursing-short term rehab (<3 hours/day)     Assistance Recommended at Discharge Frequent or constant Supervision/Assistance  Patient can return home with the following  A lot of help with walking and/or transfers;A lot of help with bathing/dressing/bathroom;Assistance with cooking/housework;Direct supervision/assist for medications management;Direct supervision/assist for financial management;Assist for transportation;Help with stairs or ramp for entrance   Equipment Recommendations  Other (comment) (defer)    Recommendations for Other Services PT consult    Precautions / Restrictions Precautions Precautions: Fall Restrictions Weight Bearing Restrictions: No       Mobility Bed Mobility Overal bed mobility: Needs Assistance Bed Mobility: Supine to Sit     Supine  to sit: Min guard     General bed mobility comments: increased visual and verbal cues to perform    Transfers Overall transfer level: Needs assistance Equipment used: Rolling walker (2 wheels) Transfers: Sit to/from Stand Sit to Stand: Min assist                 Balance Overall balance assessment: Needs assistance Sitting-balance support: Feet supported, No upper extremity supported Sitting balance-Leahy Scale: Good Sitting balance - Comments: reaches outside BOS without LOB     Standing balance-Leahy Scale: Poor Standing balance comment: reliant on external support                           ADL either performed or assessed with clinical judgement   ADL Overall ADL's : Needs assistance/impaired                     Lower Body Dressing: Minimal assistance;Sitting/lateral leans Lower Body Dressing Details (indicate cue type and reason): reaches down to pull up socks prior to exiting bed Toilet Transfer: Minimal assistance;Rolling walker (2 wheels);Ambulation;Regular Glass blower/designer Details (indicate cue type and reason): simulated via functional mobility Toileting- Clothing Manipulation and Hygiene: Moderate assistance;Sit to/from stand Toileting - Clothing Manipulation Details (indicate cue type and reason): pericare in standing     Functional mobility during ADLs: Minimal assistance;Rolling walker (2 wheels)      Extremity/Trunk Assessment Upper Extremity Assessment Upper Extremity Assessment: Generalized weakness   Lower Extremity Assessment Lower Extremity Assessment: Defer to PT evaluation        Vision   Vision Assessment?: No apparent visual deficits   Perception Perception Perception: Not tested   Praxis Praxis Praxis: Not tested    Cognition Arousal/Alertness: Awake/alert Behavior During Therapy: WFL for tasks assessed/performed, Restless Overall Cognitive Status:  No family/caregiver present to determine baseline  cognitive functioning Area of Impairment: Orientation, Memory, Safety/judgement, Problem solving                 Orientation Level: Disoriented to, Place, Time, Situation   Memory: Decreased short-term memory   Safety/Judgement: Decreased awareness of safety, Decreased awareness of deficits   Problem Solving: Decreased initiation, Requires verbal cues, Difficulty sequencing, Requires tactile cues General Comments: increased cues to perform steps of task, perseverates on  folding gown/blankets        Exercises      Shoulder Instructions       General Comments VSS on RA    Pertinent Vitals/ Pain       Pain Assessment Pain Assessment: No/denies pain  Home Living                                          Prior Functioning/Environment              Frequency  Min 2X/week        Progress Toward Goals  OT Goals(current goals can now be found in the care plan section)  Progress towards OT goals: Progressing toward goals  Acute Rehab OT Goals Patient Stated Goal: none stated OT Goal Formulation: With patient Time For Goal Achievement: 08/21/22 Potential to Achieve Goals: Good ADL Goals Pt Will Perform Upper Body Dressing: sitting;with min guard assist Pt Will Perform Lower Body Dressing: with min assist;sitting/lateral leans;sit to/from stand Pt Will Transfer to Toilet: with min assist;ambulating;regular height toilet Pt Will Perform Tub/Shower Transfer: Tub transfer;Shower transfer;with min assist;ambulating;shower seat  Plan Discharge plan remains appropriate;Frequency remains appropriate    Co-evaluation                 AM-PAC OT "6 Clicks" Daily Activity     Outcome Measure   Help from another person eating meals?: None Help from another person taking care of personal grooming?: A Little Help from another person toileting, which includes using toliet, bedpan, or urinal?: A Little Help from another person bathing (including  washing, rinsing, drying)?: A Lot Help from another person to put on and taking off regular upper body clothing?: A Little Help from another person to put on and taking off regular lower body clothing?: A Lot 6 Click Score: 17    End of Session Equipment Utilized During Treatment: Gait belt;Rolling walker (2 wheels)  OT Visit Diagnosis: Unsteadiness on feet (R26.81);Other abnormalities of gait and mobility (R26.89);Muscle weakness (generalized) (M62.81)   Activity Tolerance Patient tolerated treatment well   Patient Left in chair;with call bell/phone within reach;with chair alarm set   Nurse Communication Mobility status        Time: 4034-7425 OT Time Calculation (min): 18 min  Charges: OT General Charges $OT Visit: 1 Visit OT Treatments $Self Care/Home Management : 8-22 mins  Renaye Rakers, OTD, OTR/L SecureChat Preferred Acute Rehab [phone] - [place] 08/16/2022, 9:59 AM

## 2022-08-16 NOTE — Progress Notes (Signed)
  [facility] Progress Note    Outpatient orders: Javier Docker, MWF. 3.5hrs. Nipro. 300 BFR. 2k/2,5cal. TDC. Mircera 174mg (last dose 08/01/22). EDW 59.5kg. Her left arm access has been thrombosed for 1-2 mths. She is catheter dependent for now; d/w her unit.  Assessment/ Plan:   ESRD:  HD on MWF schedule. Tolerated full treatment on 2/5 with 0.9L net UF.  [facility]   NE[facility] tomorrow per usual schedule.   FTT -per primary service. Palliative consulted: full scope of care   Volume/ hypertension: EDW 59.5kg. UF with HD as tolerated.  Well below EDW.    Anemia of Chronic Kidney Disease, symptomatic anemia: Hemoglobin 6.7 on presentation, transfuse PRN for hgb <7. Hb 8.5 > 8.2. Just received ESA as an outpatient, will redose accordingly.    Secondary Hyperparathyroidism/Hyperphosphatemia: cont home binders   Subjective:   Seen in room.  No new complaints.   Tolerated HD fine yesterday.    Objective:   BP (!) 137/51 (BP Location: Right Arm)   Pulse 68   Temp 98 F (36.7 C) (Oral)   Resp 18   Ht '5\' 2"'$  (1.575 m)   Wt 54 kg   SpO2 94%   BMI 21.77 kg/m   Intake/Output Summary (Last 24 hours) at 08/16/2022 1007 Last data filed at 08/16/2022 0900 Gross per 24 hour  Intake 600 ml  Output 900 ml  Net -300 ml    Weight change: -0.5 kg  Physical Exam: Gen: NAD CVS: RRR Resp: normal WOB Abd: soft, nt/nd Ext: tr pitting edema b/l Les Neuro: awake, interactive Dialysis access: Left chest TDC c/d/I, LUE AVG no bruit   Imaging: No results found.  Labs: BMET Recent Labs  Lab 08/10/22 0443 08/15/22 0833  NA 137 132*  K 4.6 4.3  CL 95* 91*  CO2 23 24  GLUCOSE 91 112*  BUN 62* 70*  CREATININE 6.32* 8.73*  CALCIUM 9.3 9.6    CBC Recent Labs  Lab 08/10/22 0443 08/15/22 0833  WBC 8.1 8.6  NEUTROABS  --  7.1  HGB 8.5* 8.2*  HCT 25.5* 24.2*  MCV 107.1* 108.0*  PLT 305 326     Medications:     (feeding supplement) PROSource Plus  30 mL Oral BID BM    sodium chloride   Intravenous Once   acetaminophen  500 mg Oral BID   amiodarone  100 mg Oral Daily   aspirin EC  81 mg Oral Daily   Chlorhexidine Gluconate Cloth  6 each Topical Q0600   diclofenac Sodium  2 g Topical BID   feeding supplement (NEPRO CARB STEADY)  237 mL Oral QP37T  folic acid  1 mg Oral Daily   insulin aspart  0-6 Units Subcutaneous TID WC   levothyroxine  75 mcg Oral QAC breakfast   lisinopril  20 mg Oral Daily   metoprolol tartrate  12.5 mg Oral BID   multivitamin  1 tablet Oral Daily   omeprazole  20 mg Oral BID AC   peg 3350 powder  0.5 kit Oral Once   rosuvastatin  10 mg Oral Daily   sevelamer carbonate  2,400 mg Oral TID with meals   sodium chloride flush  3 mL Intravenous Q12H    LJannifer HickMD [facility] Pager [phone]

## 2022-08-16 NOTE — Progress Notes (Signed)
   08/16/22 0548  Vitals  Temp 98.2 F (36.8 C)  BP (!) 141/45  MAP (mmHg) 70  BP Method Automatic  Pulse Rate 66  Pulse Rate Source Monitor  Resp 18  MEWS COLOR  MEWS Score Color Green  Oxygen Therapy  SpO2 93 %  Pain Assessment  Pain Score 0  MEWS Score  MEWS Temp 0  MEWS Systolic 0  MEWS Pulse 0  MEWS RR 0  MEWS LOC 0  MEWS Score 0  Provider Notification  Provider Name/Title Bridgett Larsson, MD  Notification Reason Other (Comment) (Pt had a medium sized, maroon, type 4 stool this morning.)

## 2022-08-16 NOTE — Plan of Care (Signed)
  Problem: Nutritional: Goal: Maintenance of adequate nutrition will improve Outcome: Progressing   Problem: Tissue Perfusion: Goal: Adequacy of tissue perfusion will improve Outcome: Progressing   Problem: Clinical Measurements: Goal: Respiratory complications will improve Outcome: Progressing Goal: Cardiovascular complication will be avoided Outcome: Progressing   Problem: Nutrition: Goal: Adequate nutrition will be maintained Outcome: Progressing

## 2022-08-16 NOTE — Progress Notes (Signed)
Physical Therapy Treatment Patient Details Name: Shawna [name] MRN: [id number] DOB: 03-13-1941 Today's Date: 08/16/2022   History of Present Illness 82 year old presented with generalized weakness, difficulty walking, severe HA, difficulty turning her neck; Symptoms started about a week prior to this admission; with past medical history significant for ESRD on hemodialysis MWF, CAD status post CABG, chronic systolic heart failure, hyperlipidemia, hypertension, A-fib    PT Comments    Progressing towards acute PT goals. Cues for safety and awareness, easily distracted needing frequent cues for redirection with tasks. Min assist for transfer and gait. Verbal tempo cues to assist with facilitating continuous steps. Tolerated LE exercises without issues. Patient will continue to benefit from skilled physical therapy services to further improve independence with functional mobility.    Recommendations for follow up therapy are one component of a multi-disciplinary discharge planning process, led by the attending physician.  Recommendations may be updated based on patient status, additional functional criteria and insurance authorization.  Follow Up Recommendations  Skilled nursing-short term rehab (<3 hours/day) ([facility] not [facility] she [facility]) Can patient physically be transported by private vehicle: Yes   Assistance Recommended at Discharge Frequent or constant Supervision/Assistance  Patient can return home with the following A little help with walking and/or transfers;A little help with bathing/dressing/bathroom;Assistance with cooking/housework;Assist for transportation;Help with stairs or ramp for entrance   Equipment Recommendations  Rolling walker (2 wheels);BSC/3in1    Recommendations for Other Services       Precautions / Restrictions Precautions Precautions: Fall Restrictions Weight Bearing Restrictions: No     Mobility  Bed Mobility                General bed mobility comments: In recliner    Transfers Overall transfer level: Needs assistance Equipment used: Rolling walker (2 wheels) Transfers: Sit to/from Stand Sit to Stand: Min assist           General transfer comment: Min assist for boost, leaning posteriorly today, cues for hand placement to assist while rising and then cued to reach for RW and lean forward.    Ambulation/Gait Ambulation/Gait assistance: Min assist Gait Distance (Feet): 60 Feet Assistive device: Rolling walker (2 wheels) Gait Pattern/deviations: Step-through pattern, Decreased stride length, Trunk flexed Gait velocity: slow Gait velocity interpretation: <1.31 ft/sec, indicative of household ambulator   General Gait Details: Min assist for RW control, proximity to device, and cues throughout to facilitate continuous steps. Using verbal tempo for sequencing. No buckling noted. Attempts to sit pre maturely, cues for alignment when turning.   Stairs             Wheelchair Mobility    Modified Rankin (Stroke Patients Only)       Balance Overall balance assessment: Needs assistance Sitting-balance support: Feet supported, No upper extremity supported Sitting balance-Leahy Scale: Good Sitting balance - Comments: reaches outside BOS without LOB     Standing balance-Leahy Scale: Poor Standing balance comment: reliant on external support                            Cognition Arousal/Alertness: Awake/alert Behavior During Therapy: WFL for tasks assessed/performed, Restless Overall Cognitive Status: No family/caregiver present to determine baseline cognitive functioning Area of Impairment: Orientation, Memory, Safety/judgement, Problem solving                 Orientation Level: Disoriented to, Place, Time, Situation   Memory: Decreased short-term memory   Safety/Judgement: Decreased awareness of  safety, Decreased awareness of deficits   Problem Solving: Decreased  initiation, Requires verbal cues, Difficulty sequencing, Requires tactile cues General Comments: increased cues to perform steps of task, perseverates on  folding gown/blankets        Exercises General Exercises - Lower Extremity Ankle Circles/Pumps: AROM, Both, 15 reps, Seated Quad Sets: Strengthening, AAROM, Both, 10 reps, Seated Long Arc Quad: Strengthening, Both, 10 reps, Seated Heel Slides: AAROM, Both, 10 reps, Seated Hip ABduction/ADduction: Strengthening, Both, 10 reps, Seated    General Comments General comments (skin integrity, edema, etc.): VSS      Pertinent Vitals/Pain Pain Assessment Pain Assessment: No/denies pain    Home Living                          Prior Function            PT Goals (current goals can now be found in the care plan section) Acute Rehab PT Goals Patient Stated Goal: did not state, but agreeable to getting up PT Goal Formulation: With patient/family Time For Goal Achievement: 08/27/22 Potential to Achieve Goals: Good Progress towards PT goals: Progressing toward goals    Frequency    Min 2X/week      PT Plan Current plan remains appropriate    Co-evaluation              AM-PAC PT "6 Clicks" Mobility   Outcome Measure  Help needed turning from your back to your side while in a flat bed without using bedrails?: A Little Help needed moving from lying on your back to sitting on the side of a flat bed without using bedrails?: A Little Help needed moving to and from a bed to a chair (including a wheelchair)?: A Little Help needed standing up from a chair using your arms (e.g., wheelchair or bedside chair)?: A Little Help needed to walk in hospital room?: A Little Help needed climbing 3-5 steps with a railing? : A Lot 6 Click Score: 17    End of Session Equipment Utilized During Treatment: Gait belt Activity Tolerance: Patient tolerated treatment well Patient left: in chair;with call bell/phone within reach;with  chair alarm set Nurse Communication: Mobility status PT Visit Diagnosis: Unsteadiness on feet (R26.81);Other abnormalities of gait and mobility (R26.89)     Time: 1884-1660 PT Time Calculation (min) (ACUTE ONLY): 15 min  Charges:  $Gait Training: 8-22 mins                     Candie Mile, PT, DPT Physical Therapist Acute Rehabilitation Services [facility] Outpatient Rehabilitation Services [facility]    [name] 08/16/2022, 11:07 AM

## 2022-08-16 NOTE — Progress Notes (Signed)
   Medical records reviewed. Noted patient's only bed offer is out of network and she awaits placement in another SNF. Patient continues to tolerate HD and goals of care are clear for continuing aggressive life-prolonging interventions, with outpatient palliative care follow up for ongoing goals of care discussions.  PMT will continue to follow peripherally. Thank you for your referral and allowing PMT to assist in [name]'s care.   Dorthy Cooler, [facility] Palliative Medicine Team  Team Phone # [phone]   NO CHARGE

## 2022-08-16 NOTE — TOC Progression Note (Addendum)
Transition of Care [facility]) - Initial/Assessment Note    Patient Details  Name: Shawna [name] MRN: [id number] Date of Birth: [birth year]  [facility] CM/SW Contact:    Milinda Antis, LCSWA Phone Number: 08/16/2022, 10:39 AM  Clinical Narrative:                 LCSW contacted the patient's daughter to present new bed offer.  The family is in agreement with patient discharging to [place].  The facility will request insurance authorization once an updated physical therapy note is added.    TOC following.   Expected Discharge Plan: Skilled Nursing Facility Barriers to Discharge: Continued Medical Work up, Ship broker, SNF Pending bed offer   Patient Goals and CMS Choice   CMS Medicare.gov Compare Post Acute Care list provided to:: Patient Represenative (must comment) Choice offered to / list presented to : Spouse, Adult Children      Expected Discharge Plan and Services In-house Referral: Clinical Social Work     Living arrangements for the past 2 months: Single Family Home                                      Prior Living Arrangements/Services Living arrangements for the past 2 months: Single Family Home Lives with:: Spouse Patient language and need for interpreter reviewed:: Yes Do you feel safe going back to the place where you live?: Yes      Need for Family Participation in Patient Care: Yes (Comment) Care giver support system in place?: Yes (comment)   Criminal Activity/Legal Involvement Pertinent to Current Situation/Hospitalization: No - Comment as needed  Activities of Daily Living Home Assistive Devices/Equipment: None ADL Screening (condition at time of admission) Patient's cognitive ability adequate to safely complete daily activities?: Yes Is the patient deaf or have difficulty hearing?: No Does the patient have difficulty seeing, even when wearing glasses/contacts?: No Does the patient have difficulty  concentrating, remembering, or making decisions?: Yes Patient able to express need for assistance with ADLs?: Yes Does the patient have difficulty dressing or bathing?: Yes Independently performs ADLs?: No Does the patient have difficulty walking or climbing stairs?: Yes Weakness of Legs: Both Weakness of Arms/Hands: None  Permission Sought/Granted   Permission granted to share information with : Yes, Verbal Permission Granted (patient disoriented, permission given via spouse)     Permission granted to share info w AGENCY: SNFs        Emotional Assessment       Orientation: : Oriented to Self Alcohol / Substance Use: Not Applicable Psych Involvement: No (comment)  Admission diagnosis:  Generalized weakness [R53.1] Symptomatic anemia [D64.9] Patient Active Problem List   Diagnosis Date Noted   Benign neoplasm of transverse colon 08/11/2022   Benign neoplasm of descending colon 08/11/2022   Benign neoplasm of sigmoid colon 08/11/2022   Angiodysplasia of duodenum 08/11/2022   Delirium 04/22/2022   History of CAD (coronary artery disease) 04/22/2022   NSTEMI, initial episode of care ([place]) 10/19/2021   Symptomatic anemia 09/09/2021   Ischemic cardiomyopathy    Paroxysmal Atrial fibrillation/Flutter 05/07/2021   Neuropathy 04/23/2021   Anemia associated with chronic renal failure 04/13/2021   Left knee pain 03/15/2021   Moderate protein-calorie malnutrition ([place]) 02/27/2021   COVID-19 virus infection 02/03/2021   Leukocytosis 02/03/2021   Hypothyroidism (acquired) 02/03/2021   Myositis 12/03/2020   Ataxia 12/02/2020   Diabetes  mellitus type 2 in nonobese ([place]) 11/10/2020   Failure to thrive in adult 11/09/2020   Dialysis AV fistula malfunction, initial encounter ([place])    Jugular vein occlusion, right ([place])    Failure of surgically constructed arteriovenous fistula ([place]) 10/03/2020   Myoclonus 08/31/2020   Clotted renal dialysis AV graft, initial encounter ([place])     Hemodialysis-associated hypotension    Irritable bowel syndrome 02/25/2020   Adenomatous duodenal polyp 09/10/2019   History of GI bleed 09/10/2019   Hand steal syndrome ([place]) 08/01/2017   Coronary artery disease 38/88/2800   Complication of vascular access for dialysis 03/19/2017   Preoperative clearance 01/25/2017   H/O non-ST elevation myocardial infarction (NSTEMI) 10/24/2016   Non-ST elevation (NSTEMI) myocardial infarction [facility])    Cardiac arrest [facility])    Palliative care encounter    Goals of care, counseling/discussion    Flash pulmonary edema ([place]) 04/06/2016   History of colon cancer 01/27/2016   History of ovarian cancer 01/27/2016   PAF (paroxysmal atrial fibrillation) ([place]) 10/14/2015   Malignant neoplasm of right ovary ([place]) 10/14/2015   SVT (supraventricular tachycardia) 09/08/2015   Dyspnea    Occlusion and stenosis of carotid artery without mention of cerebral infarction 01/24/2013   Hx of CABG 07/05/2012   Carotid artery disease ([place]) 07/05/2012   Mitral regurgitation 06/12/2012   Non-STEMI (non-ST elevated myocardial infarction) ([place]) 06/08/2012   Chronic combined systolic and diastolic CHF (congestive heart failure) ([place]) 05/02/2012   AVM (arteriovenous malformation) of small bowel, acquired 01/20/2012   GERD (gastroesophageal reflux disease) 01/09/2012   HLD (hyperlipidemia) 01/05/2012   Atherosclerotic heart disease of native coronary artery without angina pectoris 12/16/2011   Anxiety disorder 05/04/2011   ESRD (end stage renal disease) on dialysis ([place]) 04/29/2011   Gout 04/29/2011   PCP:  Practice, [place]:   [facility] [address] [place] [address] Phone: [phone] Fax: [phone]  [facility] (ESRD Bundle Only) - [place], [address] [place] [address] Phone: [phone] Fax: [phone]     Social Determinants of Health ([place]) Social  History: [place]: No Food Insecurity (08/06/2022)  Housing: Low Risk  (08/06/2022)  Transportation Needs: No Transportation Needs (08/06/2022)  Utilities: Not At Risk (08/06/2022)  Financial Resource Strain: Low Risk  (12/14/2018)  Physical Activity: Unknown (12/14/2018)  Social Connections: Unknown (12/14/2018)  Stress: No Stress Concern Present (12/14/2018)  Tobacco Use: Low Risk  (08/11/2022)   SDOH Interventions:     Readmission Risk Interventions    11/12/2021    8:56 AM 10/20/2021    4:38 PM 07/23/2021    4:26 PM  Readmission Risk Prevention Plan  Transportation Screening Complete Complete Complete  Medication Review Press photographer) Complete Complete Complete  PCP or Specialist appointment within 3-5 days of discharge Complete Complete Complete  HRI or Home Care Consult Complete Complete Complete  SW Recovery Care/Counseling Consult Complete Complete Complete  Palliative Care Screening Not Applicable Not Applicable Not Applicable  Skilled Nursing Facility Complete Not Applicable Not Applicable

## 2022-08-16 NOTE — Progress Notes (Signed)
PROGRESS NOTE    RISE TRAEGER  IOX:[id number] DOB: 03/06/1941 DOA: 08/06/2022 PCP: Practice, [facility]   Brief Narrative:  82 year old with past medical history significant for ESRD on hemodialysis MWF, CAD status post CABG, chronic systolic heart failure, hyperlipidemia, hypertension, A-fib presented with generalized weakness.  Her husband reports patient been complaining of severe headache around the ear and down the neck, difficulty turning her neck, difficulty walking.  Symptoms started about a week ago.  She had a couple of shots in her neck on 1/19 and she was given a pill for it on 1/25.  It is eased some  but is still.  Patient missed HD.   Patient was last admitted 12/1-3 for anemia associated with ABLA from her fistula, not on AC despite A-fib.  She returned 12/8 with cough treated with antibiotics with indeterminate chest x-ray.  She returned 1/19 with right-sided headache, diagnosed with occipital neuralgia and treated with nerve block.  Return 1/25 with neck pain attributed to the same.    Admitted with anemia, FTT, worsening confusion.  Now pending placement.  Has been stable for several days.  Assessment & Plan:   Principal Problem:   Failure to thrive in adult Active Problems:   HLD (hyperlipidemia)   Hx of CABG   ESRD (end stage renal disease) on dialysis (HCC)   Anemia associated with chronic renal failure   Paroxysmal Atrial fibrillation/Flutter   Benign neoplasm of transverse colon   Benign neoplasm of descending colon   Benign neoplasm of sigmoid colon   Angiodysplasia of duodenum  1-FTT: -Patient with h/o ESRD, she has missed a couple of recent sessions due to progressive weakness, difficulty ambulating. -She has been seen in the ER twice recently (1/19 and 1/25) for occipital neuralgia -Anemia, received blood transfusion.  -Palliative care consulted for goals of care, full scope of treatment -PT OT eval, needs SNF, TOC working on placement.    Encephalopathy acute on chronic.  Delirium  Family report confusion since December,  TSH 5.9-PTA Synthroid dose was 50 mcg, now increased to 75 mcg. B 12; 1,981.  Ammonia level normal.  Patient is fully alert and oriented to person only for last several days and this is her new baseline.   ESRD on HD: Hemodialysis MWF, nephrology managing.  Acute on chronic anemia of chronic disease:  Anemia of CKD, presented with hemoglobin 6.7, posttransfusion improved to 9.0 and has remained stable since last couple of days.  Occult blood positive.  GI consulted, s/p EGD and colonoscopy 08/10/2022, duodenal and colonic polyps which were removed. 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    CAD:  Status post CABG Continue with aspirin and metoprolol.   HTN:  Blood pressure stable.  Continue current dose of lisinopril, metoprolol and amiodarone.     A fib;  Rates controlled on amiodarone and metoprolol, she is asymptomatic.  Not a candidate for [facility].     Hypothyroidism:  Continue with Synthroid   DM;  A1c: Continue with a sliding scale insulin   Headaches, Occipital neuralgia. Received nerve block Improved on schedule tylenol,  voltaren gel. , heating patch.   DVT prophylaxis: Place and maintain sequential compression device Start: 08/16/22 0824   Code Status: Full Code  Family Communication: none present at bedside.  Status is: Inpatient Remains inpatient appropriate because: Now medically stable, pending placement.   Estimated body mass index is 21.77 kg/m as calculated from the following:   Height as of this encounter: '5\' 2"'$  (1.575 m).   Weight  as of this encounter: 54 kg.    Nutritional Assessment: Body mass index is 21.77 kg/m.Marland Kitchen Seen by dietician.  I agree with the assessment and plan as outlined below: Nutrition Status: Nutrition Problem: Increased nutrient needs Etiology: chronic illness Signs/Symptoms: estimated needs Interventions: Nepro shake, MVI, Prostat  . Skin Assessment: I have  examined the patient's skin and I agree with the wound assessment as performed by the wound care RN as outlined below:    Consultants:  GI and nephrology  Procedures:  As above  Antimicrobials:  Anti-infectives (From admission, onward)    None         Subjective:  Patient seen and examined.  She is pleasantly confused, typically alert and oriented to self only.  Objective: Vitals:   08/15/22 2125 08/16/22 0500 08/16/22 0548 08/16/22 0927  BP: (!) 147/47  (!) 141/45 (!) 137/51  Pulse: 73  66 68  Resp: '18  18 18  '$ Temp: 98.2 F (36.8 C)  98.2 F (36.8 C) 98 F (36.7 C)  TempSrc: Oral   Oral  SpO2: 96%  93% 94%  Weight:  54 kg    Height:        Intake/Output Summary (Last 24 hours) at 08/16/2022 1130 Last data filed at 08/16/2022 1108 Gross per 24 hour  Intake 603 ml  Output 900 ml  Net -297 ml    Filed Weights   08/15/22 1208 08/15/22 1718 08/16/22 0500  Weight: 56.6 kg 55 kg 54 kg    Examination:  General exam: Appears calm and comfortable  Respiratory system: Clear to auscultation. Respiratory effort normal. Cardiovascular system: S1 & S2 heard, RRR. No JVD, murmurs, rubs, gallops or clicks. No pedal edema. Gastrointestinal system: Abdomen is nondistended, soft and nontender. No organomegaly or masses felt. Normal bowel sounds heard. Central nervous system: Alert and oriented x 1. No focal neurological deficits. Extremities: Symmetric 5 x 5 power. Skin: No rashes, lesions or ulcers.    Data Reviewed: I have personally reviewed following labs and imaging studies  CBC: Recent Labs  Lab 08/10/22 0443 08/15/22 0833  WBC 8.1 8.6  NEUTROABS  --  7.1  HGB 8.5* 8.2*  HCT 25.5* 24.2*  MCV 107.1* 108.0*  PLT 305 921    Basic Metabolic Panel: Recent Labs  Lab 08/10/22 0443 08/15/22 0833  NA 137 132*  K 4.6 4.3  CL 95* 91*  CO2 23 24  GLUCOSE 91 112*  BUN 62* 70*  CREATININE 6.32* 8.73*  CALCIUM 9.3 9.6    GFR: Estimated Creatinine  Clearance: 3.9 mL/min (A) (by C-G formula based on SCr of 8.73 mg/dL (H)). Liver Function Tests: No results for input(s): "AST", "ALT", "ALKPHOS", "BILITOT", "PROT", "ALBUMIN" in the last 168 hours.  No results for input(s): "LIPASE", "AMYLASE" in the last 168 hours. Recent Labs  Lab 08/10/22 0443  AMMONIA 31    Coagulation Profile: No results for input(s): "INR", "PROTIME" in the last 168 hours. Cardiac Enzymes: No results for input(s): "CKTOTAL", "CKMB", "CKMBINDEX", "TROPONINI" in the last 168 hours. BNP (last 3 results) No results for input(s): "PROBNP" in the last 8760 hours. HbA1C: No results for input(s): "HGBA1C" in the last 72 hours.  CBG: Recent Labs  Lab 08/15/22 1120 08/15/22 1816 08/15/22 2122 08/16/22 0751 08/16/22 1128  GLUCAP 104* 118* 103* 104* 135*    Lipid Profile: No results for input(s): "CHOL", "HDL", "LDLCALC", "TRIG", "CHOLHDL", "LDLDIRECT" in the last 72 hours. Thyroid Function Tests: No results for input(s): "TSH", "T4TOTAL", "FREET4", "T3FREE", "THYROIDAB"  in the last 72 hours.  Anemia Panel: No results for input(s): "VITAMINB12", "FOLATE", "FERRITIN", "TIBC", "IRON", "RETICCTPCT" in the last 72 hours.  Sepsis Labs: No results for input(s): "PROCALCITON", "LATICACIDVEN" in the last 168 hours.  No results found for this or any previous visit (from the past 240 hour(s)).   Radiology Studies: No results found.  Scheduled Meds:  (feeding supplement) PROSource Plus  30 mL Oral BID BM   sodium chloride   Intravenous Once   acetaminophen  500 mg Oral BID   amiodarone  100 mg Oral Daily   aspirin EC  81 mg Oral Daily   Chlorhexidine Gluconate Cloth  6 each Topical Q0600   diclofenac Sodium  2 g Topical BID   feeding supplement (NEPRO CARB STEADY)  237 mL Oral X38H   folic acid  1 mg Oral Daily   insulin aspart  0-6 Units Subcutaneous TID WC   levothyroxine  75 mcg Oral QAC breakfast   lisinopril  20 mg Oral Daily   metoprolol tartrate   12.5 mg Oral BID   multivitamin  1 tablet Oral Daily   omeprazole  20 mg Oral BID AC   peg 3350 powder  0.5 kit Oral Once   rosuvastatin  10 mg Oral Daily   sevelamer carbonate  2,400 mg Oral TID with meals   sodium chloride flush  3 mL Intravenous Q12H   Continuous Infusions:  anticoagulant sodium citrate       LOS: 10 days   Darliss Cheney, MD [facility]  08/16/2022, 11:30 AM   *Please note that this is a verbal dictation therefore any spelling or grammatical errors are due to the "[place] [facility]" system interpretation.  Please page via [place] and do not message via secure chat for urgent patient care matters. Secure chat can be used for non urgent patient care matters.  How to contact the [facility] Attending or Consulting provider [place] or covering provider during after hours [place], for this patient?  Check the care team in [facility] and look for a) attending/consulting TRH provider listed and b) the [facility] team listed. Page or secure chat [phone]. Log into [email or web address] and use [place]'s universal password to access. If you do not have the password, please contact the hospital operator. Locate the [facility] provider you are looking for under [facility] and page to a number that you can be directly reached. If you still have difficulty reaching the provider, please page the [facility] (Director on Call) for the Hospitalists listed on amion for assistance.

## 2022-08-17 DIAGNOSIS — R627 Adult failure to thrive: Secondary | ICD-10-CM | POA: Diagnosis not present

## 2022-08-17 DIAGNOSIS — D649 Anemia, unspecified: Secondary | ICD-10-CM | POA: Diagnosis not present

## 2022-08-17 DIAGNOSIS — R531 Weakness: Secondary | ICD-10-CM | POA: Diagnosis not present

## 2022-08-17 LAB — GLUCOSE, CAPILLARY
Glucose-Capillary: 87 mg/dL (ref 70–99)
Glucose-Capillary: 114 mg/dL — ABNORMAL HIGH (ref 70–99)
Glucose-Capillary: 66 mg/dL — ABNORMAL LOW (ref 70–99)
Glucose-Capillary: 103 mg/dL — ABNORMAL HIGH (ref 70–99)

## 2022-08-17 MED ORDER — DARBEPOETIN ALFA 100 MCG/0.5ML IJ SOSY
100.0000 ug | PREFILLED_SYRINGE | INTRAMUSCULAR | Status: DC
  Administered 2022-08-17: 100 ug via SUBCUTANEOUS
  Filled 2022-08-17: qty 0.5

## 2022-08-17 MED ORDER — ALPRAZOLAM 0.5 MG PO TABS: 0.5000 mg | ORAL_TABLET | Freq: Every day | ORAL | 0 refills | Status: DC | PRN

## 2022-08-17 MED ORDER — LISINOPRIL 20 MG PO TABS: 20.0000 mg | ORAL_TABLET | Freq: Every day | ORAL | 0 refills | Status: DC

## 2022-08-17 MED ORDER — HEPARIN SODIUM (PORCINE) 1000 UNIT/ML IJ SOLN
INTRAMUSCULAR | Status: AC
  Filled 2022-08-17: qty 4

## 2022-08-17 NOTE — Progress Notes (Addendum)
Received patient in bed to unit.  Alert  Informed consent signed and in chart.   TX duration:  Transported back to the room  Alert, without acute distress.  Hand-off given to patient's nurse.   Access used: catheter Access issues: n/a  Total UF removed: 0.9L Medication(s) given: n/a Post HD weight: 54.3 KG    08/17/22 1545  Vitals  Temp (!) 97.3 F (36.3 C)  Temp Source Oral  BP (!) 141/59  MAP (mmHg) 82  BP Location Right Arm  BP Method Automatic  Patient Position (if appropriate) Lying  Pulse Rate 67  Pulse Rate Source Monitor  ECG Heart Rate 68  Resp 16  Oxygen Therapy  SpO2 100 %  O2 Device Room Air  During Treatment Monitoring  Blood Flow Rate (mL/min) 350 mL/min  Arterial Pressure (mmHg) -110 mmHg  Venous Pressure (mmHg) 150 mmHg  TMP (mmHg) 18 mmHg  Ultrafiltration Rate (mL/min) 0 mL/min  Dialysate Flow Rate (mL/min) 300 ml/min  HD Safety Checks Performed Yes  Intra-Hemodialysis Comments Tx completed  Post Treatment  Dialyzer Clearance Lightly streaked  Duration of HD Treatment -hour(s) 3 hour(s)  Hemodialysis Intake (mL) 0 mL  Liters Processed 63  Fluid Removed (mL) 900 mL  Tolerated HD Treatment Yes  Post-Hemodialysis Comments  (Patient began to cramp and not feel good  and Bp dropped.)  Hemodialysis Catheter Left Subclavian Double lumen Temporary (Non-Tunneled)  Placement Date: 06/09/22   Placed prior to admission: Yes  Orientation: Left  Access Location: Subclavian  Hemodialysis Catheter Type: Double lumen Temporary (Non-Tunneled)  Site Condition No complications  Blue Lumen Status Flushed  Red Lumen Status Flushed  Catheter fill volume (Arterial) 1.9 cc  Catheter fill volume (Venous) 1.9  Dressing Type Transparent  Dressing Status Antimicrobial disc in place  Interventions New dressing  Drainage Description None  Dressing Change Due 08/22/22  Post treatment catheter status Capped and [facility]

## 2022-08-17 NOTE — Progress Notes (Addendum)
Advised by CSW that pt will d/c to snf this afternoon after HD. Contacted [facility] and spoke to [place]. Clinic advised pt should d/c later today and resume care at clinic on Friday. Clinic advised of snf name that pt will admit to as well. Will fax d/c summary to clinic for continuation of care once completed.   Melven Sartorius Renal Navigator [phone]  Addendum at 2:56 pm: D/C summary and today's renal note faxed to clinic for continuation of care.

## 2022-08-17 NOTE — Discharge Summary (Signed)
Physician Discharge Summary   Patient: Shawna [name] MRN: [id number] DOB: 02/06/1941  Admit date:     08/06/2022  Discharge date: 08/17/22  Discharge Physician: Marylu Lund   PCP: [facility]   Recommendations at discharge:    Follow up with PCP in 1-2 weeks Follow up with scheduled MWF dialysis  Discharge Diagnoses: Principal Problem:   Failure to thrive in adult Active Problems:   HLD (hyperlipidemia)   Hx of CABG   ESRD (end stage renal disease) on dialysis (HCC)   Anemia associated with chronic renal failure   Paroxysmal Atrial fibrillation/Flutter   Benign neoplasm of transverse colon   Benign neoplasm of descending colon   Benign neoplasm of sigmoid colon   Angiodysplasia of duodenum  Resolved Problems:   * No resolved hospital problems. [facility] Course: No notes on file  Assessment and Plan: 1-FTT: -Patient with h/o ESRD, she has missed a couple of recent sessions due to progressive weakness, difficulty ambulating. -She has been seen in the ER twice recently (1/19 and 1/25) for occipital neuralgia -Received blood transfusion this visit -Palliative care consulted for goals of care, full scope of treatment -PT OT eval, needs SNF   Encephalopathy acute on chronic.  Delirium  Family report confusion since December,  TSH 5.9-PTA Synthroid dose was 50 mcg, now increased to 75 mcg. B 12; 1,981.  Ammonia level normal.  Patient is fully alert and oriented to person only for last several days and this is noted to be her new baseline.   ESRD on HD: Hemodialysis MWF, nephrology managing.   Acute on chronic anemia of chronic disease:  Anemia of CKD, presented with hemoglobin 6.7, posttransfusion improved to 9.0 and has remained stable since last couple of days.  Occult blood positive.  GI consulted, s/p EGD and colonoscopy 08/10/2022, duodenal and colonic polyps which were removed. 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    CAD:  Status post CABG Continue with  aspirin and metoprolol.   HTN:  Blood pressure stable.  Continue current dose of lisinopril, metoprolol and amiodarone.     A fib;  Rates controlled on amiodarone and metoprolol, she is asymptomatic.  Not a candidate for [facility].     Hypothyroidism:  Continue with Synthroid   DM;  A1c: Continue with a sliding scale insulin   Headaches, Occipital neuralgia. Received nerve block Improved on schedule tylenol,  voltaren gel. , heating patch.        Consultants: Nephrology,GI  Procedures performed: EGD, colonoscopy  Disposition: Skilled nursing facility Diet recommendation:  Regular diet DISCHARGE MEDICATION: Allergies as of 08/17/2022       Reactions   Amlodipine Swelling   Aspirin Other (See Comments)   High Doses Mess up her stomach; "makes my bowels have blood in them". Takes 81 mg EC Aspirin    Nitrofurantoin Hives   Bactrim [sulfamethoxazole-trimethoprim] Rash   Contrast Media [iodinated Contrast Media] Itching   Iron Itching, Other (See Comments)   "they gave me iron in dialysis; had to give me Benadryl cause I had to have the iron" (05/02/2012)   Tylenol [acetaminophen] Itching, Other (See Comments)   Makes her feet on fire per pt - can tolerate per pt   Gabapentin Other (See Comments)   Unknown reaction   Iron Sucrose Other (See Comments)   Unknown   Ranexa [ranolazine] Other (See Comments)   Myoclonus-hospitalized    Sucroferric Oxyhydroxide Other (See Comments)   Unknown   Dexilant [dexlansoprazole] Other (See Comments)  Upset stomach   Hydralazine Itching   Has tolerated while inpatient Has tolerated while inpatient   Levaquin [levofloxacin In D5w] Rash   Morphine And Related Itching, Other (See Comments)   Itching in feet   Pantoprazole Rash   Plavix [clopidogrel Bisulfate] Rash   Protonix [pantoprazole Sodium] Rash   Venofer [ferric Oxide] Itching, Other (See Comments)   Patient reports using Benadryl prior to doses as [facility]         Medication List     STOP taking these medications    cyclobenzaprine 5 MG tablet Commonly known as: FLEXERIL       TAKE these medications    ALPRAZolam 0.5 MG tablet Commonly known as: XANAX Take 1 tablet (0.5 mg total) by mouth daily as needed for anxiety.   amiodarone 100 MG tablet Commonly known as: PACERONE Take 1 tablet (100 mg total) by mouth daily.   Aspirin 81 MG Caps Take 1 capsule by mouth daily at 12 noon.   benzonatate 100 MG capsule Commonly known as: TESSALON Take 100 mg by mouth 3 (three) times daily as needed for cough.   folic acid 1 MG tablet Commonly known as: FOLVITE Take 1 mg by mouth daily.   hydrOXYzine 25 MG tablet Commonly known as: ATARAX Take 25 mg by mouth every 6 (six) hours as needed for itching.   levothyroxine 75 MCG tablet Commonly known as: SYNTHROID Take 1 tablet (75 mcg total) by mouth daily before breakfast. What changed:  medication strength how much to take   lisinopril 20 MG tablet Commonly known as: ZESTRIL Take 1 tablet (20 mg total) by mouth daily. Start taking on: August 18, 2022   metoprolol tartrate 25 MG tablet Commonly known as: LOPRESSOR Take 0.5 tablets (12.5 mg total) by mouth 2 (two) times daily.   multivitamin Tabs tablet Take 1 tablet by mouth daily.   nitroGLYCERIN 0.4 MG SL tablet Commonly known as: NITROSTAT Place 0.4 mg under the tongue every 5 (five) minutes as needed for chest pain.   omeprazole 20 MG capsule Commonly known as: PRILOSEC Take 2 capsules (40 mg total) by mouth daily as needed (acid reflux). What changed:  how much to take when to take this   ondansetron 4 MG disintegrating tablet Commonly known as: ZOFRAN-ODT Take 4 mg by mouth every 8 (eight) hours as needed for nausea or vomiting.   rosuvastatin 10 MG tablet Commonly known as: CRESTOR Take 1 tablet by mouth once daily   sevelamer carbonate 800 MG tablet Commonly known as: RENVELA Take 3,200 mg by mouth in the  morning and at bedtime.        Follow-up Information     [facility] Follow up in 2 week(s).   Why: Hospital follow up Contact information: [address] [place] [address] [phone]                Discharge Exam: Danley Danker Weights   08/15/22 1718 08/16/22 0500 08/17/22 0529  Weight: 55 kg 54 kg 55.8 kg   General exam: Awake, laying in bed, in nad Respiratory system: Normal respiratory effort, no wheezing Cardiovascular system: regular rate, s1, s2 Gastrointestinal system: Soft, nondistended, positive BS Central nervous system: CN2-12 grossly intact, strength intact Extremities: Perfused, no clubbing Skin: Normal skin turgor, no notable skin lesions seen Psychiatry: Mood normal // no visual hallucinations   Condition at discharge: fair  The results of significant diagnostics from this hospitalization (including imaging, microbiology, ancillary and laboratory) are  listed below for reference.   Imaging Studies: CT HEAD WO CONTRAST (5MM)  Result Date: 08/06/2022 CLINICAL DATA:  Confusion, missed hemodialysis EXAM: CT HEAD WITHOUT CONTRAST TECHNIQUE: Contiguous axial images were obtained from the base of the skull through the vertex without intravenous contrast. RADIATION DOSE REDUCTION: This exam was performed according to the departmental dose-optimization program which includes automated exposure control, adjustment of the mA and/or kV according to patient size and/or use of iterative reconstruction technique. COMPARISON:  07/29/2022 FINDINGS: Brain: No evidence of acute infarction, hemorrhage, hydrocephalus, extra-axial collection or mass lesion/mass effect. Extensive subcortical white matter and periventricular small vessel ischemic changes. Global cortical atrophy. Vascular: Intracranial atherosclerosis. Skull: Normal. Negative for fracture or focal lesion. Sinuses/Orbits: The visualized paranasal sinuses are essentially clear. The mastoid air cells are  unopacified. Other: None. IMPRESSION: No evidence of acute intracranial abnormality. Atrophy with small vessel ischemic changes. Electronically Signed   By: Julian Hy M.D.   On: 08/06/2022 02:38   DG Chest 2 View  Result Date: 08/06/2022 CLINICAL DATA:  Shortness of breath and confusion. Missed dialysis yesterday EXAM: CHEST - 2 VIEW COMPARISON:  07/14/2022 FINDINGS: Left IJ dialysis catheter tip in the right atrium. Sternotomy and CABG. Coronary stenting. Left axillary vascular stent. Stable cardiomediastinal silhouette. Aortic atherosclerotic calcification. Bibasilar atelectasis or infiltrates. Small bilateral pleural effusions. No pneumothorax. IMPRESSION: Bibasilar atelectasis or infiltrates. Cardiomegaly and small bilateral pleural effusions. Electronically Signed   By: Placido Sou M.D.   On: 08/06/2022 02:02   VAS [place] UPPER EXT VEIN MAPPING (PRE-OP AVF)  Result Date: 08/02/2022 UPPER EXTREMITY VEIN MAPPING Patient Name:  LOUANNA VANLIEW  Date of Exam:   07/28/2022 Medical Rec #: [id number]     Accession #:    [id number] Date of Birth: [birth year]     Patient Gender: F Patient Age:   71 years Exam Location:  [facility] Procedure:      VAS [place] UPPER EXT VEIN MAPPING (PRE-OP AVF) Referring Phys: Hortencia Pilar --------------------------------------------------------------------------------  Indications: Pre-access. History: Esrd/ Lt brach ax occluded.  Performing Technologist: Concha Norway RVT  Examination Guidelines: A complete evaluation includes B-mode imaging, spectral Doppler, color Doppler, and power Doppler as needed of all accessible portions of each vessel. Bilateral testing is considered an integral part of a complete examination. Limited examinations for reoccurring indications may be performed as noted. +-----------------+-------------+----------+--------------+ Right Cephalic   Diameter (cm)Depth (cm)   Findings     +-----------------+-------------+----------+--------------+ Prox upper arm       0.10                              +-----------------+-------------+----------+--------------+ Mid upper arm        0.09                              +-----------------+-------------+----------+--------------+ Dist upper arm       0.08                              +-----------------+-------------+----------+--------------+ Antecubital fossa    0.07                              +-----------------+-------------+----------+--------------+ Prox forearm         0.04                              +-----------------+-------------+----------+--------------+  Mid forearm                             not visualized +-----------------+-------------+----------+--------------+ Dist forearm                            not visualized +-----------------+-------------+----------+--------------+ Wrist                                   not visualized +-----------------+-------------+----------+--------------+ +-----------------+-------------+----------+--------+ Right Basilic    Diameter (cm)Depth (cm)Findings +-----------------+-------------+----------+--------+ Prox upper arm       0.51                        +-----------------+-------------+----------+--------+ Mid upper arm        0.36                        +-----------------+-------------+----------+--------+ Dist upper arm       0.35                        +-----------------+-------------+----------+--------+ Antecubital fossa    0.29                        +-----------------+-------------+----------+--------+ Prox forearm         0.32                        +-----------------+-------------+----------+--------+ Summary: Right: Normal compressible basilic vein. Small diameter cephalic        vein.        Duplex of brachial, radial, and ulnar arteries are listed        below.        Brach diameter = .43 heavily calcified Vel = 84cm/s         Rad Diameter = .12 moderately calcified Vel = 44        Ulnar Diameter = .12 moderately calcified Vel = 56cm/s.  *See table(s) above for measurements and observations.  Diagnosing physician: Hortencia Pilar MD Electronically signed by Hortencia Pilar MD on 08/02/2022 at 7:22:21 AM.    Final    VAS [place] (AVF, AVG)  Result Date: 08/02/2022 DIALYSIS ACCESS Patient Name:  DERIANA VANDERHOEF  Date of Exam:   07/28/2022 Medical Rec #: [id number]     Accession #:    [id number] Date of Birth: [birth year]     Patient Gender: F Patient Age:   4 years Exam Location:  [facility] Procedure:      VAS US DUPLEX DIALYSIS ACCESS (AVF, AVG) Referring Phys: Hortencia Pilar --------------------------------------------------------------------------------  Reason for Exam: Unable to dialyze through AVF/AVG. Access Site: Left Upper Extremity. Access Type: BrachAx. History: Esrd. Comparison Study: 10/2021 Performing Technologist: Concha Norway RVT  Examination Guidelines: A complete evaluation includes B-mode imaging, spectral Doppler, color Doppler, and power Doppler as needed of all accessible portions of each vessel. Unilateral testing is considered an integral part of a complete examination. Limited examinations for reoccurring indications may be performed as noted.  Findings:   +--------------------+----------+-----------------+--------+ AVG                 PSV (cm/s)Flow Vol (mL/min)Describe +--------------------+----------+-----------------+--------+ Native artery inflow   108                              +--------------------+----------+-----------------+--------+  Arterial anastomosis    74                              +--------------------+----------+-----------------+--------+ Prox graft                                     occluded +--------------------+----------+-----------------+--------+ Mid graft                                      occluded  +--------------------+----------+-----------------+--------+ Distal graft                                   occluded +--------------------+----------+-----------------+--------+  Summary: Occluded left BrachAx AVG from anastomosis site throughout.  *See table(s) above for measurements and observations.  Diagnosing physician: Hortencia Pilar MD Electronically signed by Hortencia Pilar MD on 08/02/2022 at 7:22:15 AM.    --------------------------------------------------------------------------------   Final    CT Head Wo Contrast  Result Date: 07/29/2022 CLINICAL DATA:  Chronic headache and neck pain.  Neck stiffness. EXAM: CT HEAD WITHOUT CONTRAST CT CERVICAL SPINE WITHOUT CONTRAST TECHNIQUE: Multidetector CT imaging of the head and cervical spine was performed following the standard protocol without intravenous contrast. Multiplanar CT image reconstructions of the cervical spine were also generated. RADIATION DOSE REDUCTION: This exam was performed according to the departmental dose-optimization program which includes automated exposure control, adjustment of the mA and/or kV according to patient size and/or use of iterative reconstruction technique. COMPARISON:  CT head and MR brain dated April 23, 2022. MRI cervical spine dated April 24, 2022. FINDINGS: CT HEAD FINDINGS Brain: No evidence of acute infarction, hemorrhage, hydrocephalus, extra-axial collection or mass lesion/mass effect. Stable atrophy and chronic microvascular ischemic changes. Vascular: Calcified atherosclerosis at the skull base. No hyperdense vessel. Skull: Normal. Negative for fracture or focal lesion. Sinuses/Orbits: No acute finding. Other: None. CT CERVICAL SPINE FINDINGS Alignment: Normal. Skull base and vertebrae: No acute fracture. No primary bone lesion or focal pathologic process. Soft tissues and spinal canal: No prevertebral fluid or swelling. No visible canal hematoma. Disc levels: Moderate disc height loss at C6-C7.  Scattered mild facet arthropathy throughout the cervical spine. Upper chest: Negative. Other: None. IMPRESSION: 1. No acute intracranial abnormality. Stable atrophy and chronic microvascular ischemic changes. 2. No acute cervical spine fracture or traumatic malalignment. 3. Moderate degenerative disc disease at C6-C7. Electronically Signed   By: Titus Dubin M.D.   On: 07/29/2022 16:39   CT Cervical Spine Wo Contrast  Result Date: 07/29/2022 CLINICAL DATA:  Chronic headache and neck pain.  Neck stiffness. EXAM: CT HEAD WITHOUT CONTRAST CT CERVICAL SPINE WITHOUT CONTRAST TECHNIQUE: Multidetector CT imaging of the head and cervical spine was performed following the standard protocol without intravenous contrast. Multiplanar CT image reconstructions of the cervical spine were also generated. RADIATION DOSE REDUCTION: This exam was performed according to the departmental dose-optimization program which includes automated exposure control, adjustment of the mA and/or kV according to patient size and/or use of iterative reconstruction technique. COMPARISON:  CT head and MR brain dated April 23, 2022. MRI cervical spine dated April 24, 2022. FINDINGS: CT HEAD FINDINGS Brain: No evidence of acute infarction, hemorrhage, hydrocephalus, extra-axial collection or mass lesion/mass effect. Stable atrophy and chronic microvascular ischemic changes.  Vascular: Calcified atherosclerosis at the skull base. No hyperdense vessel. Skull: Normal. Negative for fracture or focal lesion. Sinuses/Orbits: No acute finding. Other: None. CT CERVICAL SPINE FINDINGS Alignment: Normal. Skull base and vertebrae: No acute fracture. No primary bone lesion or focal pathologic process. Soft tissues and spinal canal: No prevertebral fluid or swelling. No visible canal hematoma. Disc levels: Moderate disc height loss at C6-C7. Scattered mild facet arthropathy throughout the cervical spine. Upper chest: Negative. Other: None. IMPRESSION: 1. No  acute intracranial abnormality. Stable atrophy and chronic microvascular ischemic changes. 2. No acute cervical spine fracture or traumatic malalignment. 3. Moderate degenerative disc disease at C6-C7. Electronically Signed   By: Titus Dubin M.D.   On: 07/29/2022 16:39    Microbiology: Results for orders placed or performed during the hospital encounter of 06/17/22  Resp Panel by RT-PCR (Flu A&B, Covid) Anterior Nasal Swab     Status: None   Collection Time: 06/17/22  5:40 AM   Specimen: Anterior Nasal Swab  Result Value Ref Range Status   SARS Coronavirus 2 by RT PCR NEGATIVE NEGATIVE Final    Comment: (NOTE) SARS-CoV-2 target nucleic acids are NOT DETECTED.  The SARS-CoV-2 RNA is generally detectable in upper respiratory specimens during the acute phase of infection. The lowest concentration of SARS-CoV-2 viral copies this assay can detect is 138 copies/mL. A negative result does not preclude SARS-Cov-2 infection and should not be used as the sole basis for treatment or other patient management decisions. A negative result may occur with  improper specimen collection/handling, submission of specimen other than nasopharyngeal swab, presence of viral mutation(s) within the areas targeted by this assay, and inadequate number of viral copies(<138 copies/mL). A negative result must be combined with clinical observations, patient history, and epidemiological information. The expected result is Negative.  Fact Sheet for Patients:  EntrepreneurPulse.com.au  Fact Sheet for Healthcare Providers:  IncredibleEmployment.be  This test is no t yet approved or cleared by the [place] FDA and  has been authorized for detection and/or diagnosis of SARS-CoV-2 by FDA under an Emergency Use Authorization (EUA). This EUA will remain  in effect (meaning this test can be used) for the duration of the COVID-19 declaration under Section 564(b)(1) of the Act,  21 U.S.C.section 360bbb-3(b)(1), unless the authorization is terminated  or revoked sooner.       Influenza A by PCR NEGATIVE NEGATIVE Final   Influenza B by PCR NEGATIVE NEGATIVE Final    Comment: (NOTE) The Xpert Xpress SARS-CoV-2/FLU/RSV plus assay is intended as an aid in the diagnosis of influenza from Nasopharyngeal swab specimens and should not be used as a sole basis for treatment. Nasal washings and aspirates are unacceptable for Xpert Xpress SARS-CoV-2/FLU/RSV testing.  Fact Sheet for Patients: EntrepreneurPulse.com.au  Fact Sheet for Healthcare Providers: IncredibleEmployment.be  This test is not yet approved or cleared by the [place] FDA and has been authorized for detection and/or diagnosis of SARS-CoV-2 by FDA under an Emergency Use Authorization (EUA). This EUA will remain in effect (meaning this test can be used) for the duration of the COVID-19 declaration under Section 564(b)(1) of the Act, 21 U.S.C. section 360bbb-3(b)(1), unless the authorization is terminated or revoked.  Performed at [facility], [address] [place],  [address]    *Note: Due to a large number of results and/or encounters for the requested time period, some results have not been displayed. A complete set of results can be found in Results Review.    Labs: CBC: Recent  Labs  Lab 08/15/22 0833 08/16/22 0959  WBC 8.6 8.8  NEUTROABS 7.1 6.7  HGB 8.2* 8.4*  HCT 24.2* 26.0*  MCV 108.0* 109.7*  PLT 326 PLATELET CLUMPS NOTED ON SMEAR, COUNT APPEARS ADEQUATE   Basic Metabolic Panel: Recent Labs  Lab 08/15/22 0833  NA 132*  K 4.3  CL 91*  CO2 24  GLUCOSE 112*  BUN 70*  CREATININE 8.73*  CALCIUM 9.6   Liver Function Tests: No results for input(s): "AST", "ALT", "ALKPHOS", "BILITOT", "PROT", "ALBUMIN" in the last 168 hours. CBG: Recent Labs  Lab 08/16/22 1128 08/16/22 1613 08/16/22 2108 08/17/22 0725 08/17/22 1117   GLUCAP 135* 143* 93 87 114*    Discharge time spent: less than 30 minutes.  Signed: Marylu Lund, MD [facility] 08/17/2022

## 2022-08-17 NOTE — Progress Notes (Signed)
[name] to be D/C'd Skilled nursing facility per MD order.  Discussed with the patient and all questions fully answered.  VSS, Skin clean, dry and intact without evidence of skin break down, no evidence of skin tears noted. IV catheter discontinued intact. Site without signs and symptoms of complications. Dressing and pressure applied.  An After Visit Summary was printed and given to [facility] for receiving facility.  Report given to Christena Deem, receiving nurse at [facility] [employer].  Patient escorted via stretcher, and D/C to SNF via non emergency ambulance.  Manuella Ghazi 08/17/2022 7:44 PM

## 2022-08-17 NOTE — TOC Progression Note (Signed)
Transition of Care [facility]) - Initial/Assessment Note    Patient Details  Name: Shawna [name] MRN: [id number] Date of Birth: Apr 19, 1941  Transition of Care [facility]) CM/SW Contact:    Milinda Antis, [place] Phone Number: 08/17/2022, 10:39 AM  Clinical Narrative:                 Insurance authorization for SNF has been approved.  MD notified.   Expected Discharge Plan: Skilled Nursing Facility Barriers to Discharge: Continued Medical Work up, Ship broker, SNF Pending bed offer   Patient Goals and CMS Choice   CMS Medicare.gov Compare Post Acute Care list provided to:: Patient Represenative (must comment) Choice offered to / list presented to : Spouse, Adult Children      Expected Discharge Plan and Services In-house Referral: Clinical Social Work     Living arrangements for the past 2 months: Single Family Home                                      Prior Living Arrangements/Services Living arrangements for the past 2 months: Single Family Home Lives with:: Spouse Patient language and need for interpreter reviewed:: Yes Do you feel safe going back to the place where you live?: Yes      Need for Family Participation in Patient Care: Yes (Comment) Care giver support system in place?: Yes (comment)   Criminal Activity/Legal Involvement Pertinent to Current Situation/Hospitalization: No - Comment as needed  Activities of Daily Living Home Assistive Devices/Equipment: None ADL Screening (condition at time of admission) Patient's cognitive ability adequate to safely complete daily activities?: Yes Is the patient deaf or have difficulty hearing?: No Does the patient have difficulty seeing, even when wearing glasses/contacts?: No Does the patient have difficulty concentrating, remembering, or making decisions?: Yes Patient able to express need for assistance with ADLs?: Yes Does the patient have difficulty dressing or bathing?: Yes Independently performs ADLs?:  No Does the patient have difficulty walking or climbing stairs?: Yes Weakness of Legs: Both Weakness of Arms/Hands: None  Permission Sought/Granted   Permission granted to share information with : Yes, Verbal Permission Granted (patient disoriented, permission given via spouse)     Permission granted to share info w AGENCY: SNFs        Emotional Assessment       Orientation: : Oriented to Self Alcohol / Substance Use: Not Applicable Psych Involvement: No (comment)  Admission diagnosis:  Generalized weakness [R53.1] Symptomatic anemia [D64.9] Patient Active Problem List   Diagnosis Date Noted   Benign neoplasm of transverse colon 08/11/2022   Benign neoplasm of descending colon 08/11/2022   Benign neoplasm of sigmoid colon 08/11/2022   Angiodysplasia of duodenum 08/11/2022   Delirium 04/22/2022   History of CAD (coronary artery disease) 04/22/2022   NSTEMI, initial episode of care ([place]) 10/19/2021   Symptomatic anemia 09/09/2021   Ischemic cardiomyopathy    Paroxysmal Atrial fibrillation/Flutter 05/07/2021   Neuropathy 04/23/2021   Anemia associated with chronic renal failure 04/13/2021   Left knee pain 03/15/2021   Moderate protein-calorie malnutrition ([place]) 02/27/2021   COVID-19 virus infection 02/03/2021   Leukocytosis 02/03/2021   Hypothyroidism (acquired) 02/03/2021   Myositis 12/03/2020   Ataxia 12/02/2020   Diabetes mellitus type 2 in nonobese ([place]) 11/10/2020   Failure to thrive in adult 11/09/2020   Dialysis AV fistula malfunction, initial encounter ([place])    Jugular vein occlusion, right ([place])  Failure of surgically constructed arteriovenous fistula ([place]) 10/03/2020   Myoclonus 08/31/2020   Clotted renal dialysis AV graft, initial encounter ([place])    Hemodialysis-associated hypotension    Irritable bowel syndrome 02/25/2020   Adenomatous duodenal polyp 09/10/2019   History of GI bleed 09/10/2019   Hand steal syndrome ([place]) 08/01/2017   Coronary artery  disease 35/32/9924   Complication of vascular access for dialysis 03/19/2017   Preoperative clearance 01/25/2017   H/O non-ST elevation myocardial infarction (NSTEMI) 10/24/2016   Non-ST elevation (NSTEMI) myocardial infarction [facility])    Cardiac arrest [facility])    Palliative care encounter    Goals of care, counseling/discussion    Flash pulmonary edema ([place]) 04/06/2016   History of colon cancer 01/27/2016   History of ovarian cancer 01/27/2016   PAF (paroxysmal atrial fibrillation) ([place]) 10/14/2015   Malignant neoplasm of right ovary ([place]) 10/14/2015   SVT (supraventricular tachycardia) 09/08/2015   Dyspnea    Occlusion and stenosis of carotid artery without mention of cerebral infarction 01/24/2013   Hx of CABG 07/05/2012   Carotid artery disease ([place]) 07/05/2012   Mitral regurgitation 06/12/2012   Non-STEMI (non-ST elevated myocardial infarction) ([place]) 06/08/2012   Chronic combined systolic and diastolic CHF (congestive heart failure) ([place]) 05/02/2012   AVM (arteriovenous malformation) of small bowel, acquired 01/20/2012   GERD (gastroesophageal reflux disease) 01/09/2012   HLD (hyperlipidemia) 01/05/2012   Atherosclerotic heart disease of native coronary artery without angina pectoris 12/16/2011   Anxiety disorder 05/04/2011   ESRD (end stage renal disease) on dialysis ([place]) 04/29/2011   Gout 04/29/2011   PCP:  Practice, [place]:   [facility] [address] [place] [address] Phone: [phone] Fax: [phone]  [facility] (ESRD Bundle Only) - [place], [address] [place] [address] Phone: [phone] Fax: [phone]     Social Determinants of Health ([place]) Social History: [place]: No Food Insecurity (08/06/2022)  Housing: Low Risk  (08/06/2022)  Transportation Needs: No Transportation Needs (08/06/2022)  Utilities: Not At Risk (08/06/2022)   Financial Resource Strain: Low Risk  (12/14/2018)  Physical Activity: Unknown (12/14/2018)  Social Connections: Unknown (12/14/2018)  Stress: No Stress Concern Present (12/14/2018)  Tobacco Use: Low Risk  (08/11/2022)   SDOH Interventions:     Readmission Risk Interventions    11/12/2021    8:56 AM 10/20/2021    4:38 PM 07/23/2021    4:26 PM  Readmission Risk Prevention Plan  Transportation Screening Complete Complete Complete  Medication Review Press photographer) Complete Complete Complete  PCP or Specialist appointment within 3-5 days of discharge Complete Complete Complete  HRI or Home Care Consult Complete Complete Complete  SW Recovery Care/Counseling Consult Complete Complete Complete  Palliative Care Screening Not Applicable Not Applicable Not Applicable  Skilled Nursing Facility Complete Not Applicable Not Applicable

## 2022-08-17 NOTE — Progress Notes (Signed)
  [facility] Progress Note    Outpatient orders: Javier Docker, MWF. 3.5hrs. Nipro. 300 BFR. 2k/2,5cal. TDC. Mircera 139mg (last dose 08/01/22). EDW 59.5kg. Her left arm access has been thrombosed for 1-2 mths. She is catheter dependent for now; d/w her unit.  Assessment/ Plan:   ESRD:  HD on MWF schedule. Tolerated full treatment on 2/5 with 0.9L net U[facility].  [facility]   Next today per usual schedule.   FTT -per primary service. Palliative consulted: full scope of care   Volume/ hypertension: EDW 59.5kg. UF with HD as tolerated.  Well below EDW.    Anemia of Chronic Kidney Disease, symptomatic anemia: Hemoglobin 6.7 on presentation, transfuse PRN for hgb <7. Hb 8.5 > 8.2 > 8.4. Redose ESA.    Secondary Hyperparathyroidism/Hyperphosphatemia: cont home binders   OK for d/c from my perspective when ready otherwise.  Subjective:   Seen in room.  No new complaints.   For HD today.   Objective:   BP (!) 166/59   Pulse 67   Temp 98.5 F (36.9 C)   Resp 20   Ht '5\' 2"'$  (1.575 m)   Wt 55.8 kg   SpO2 100%   BMI 22.50 kg/m   Intake/Output Summary (Last 24 hours) at 08/17/2022 06195Last data filed at 08/17/2022 0534 Gross per 24 hour  Intake 483 ml  Output 0 ml  Net 483 ml    Weight change: -0.8 kg  Physical Exam: Gen: NAD CVS: RRR Resp: normal WOB Abd: soft, nt/nd Ext: tr pitting edema b/l Les Neuro: awake, interactive Dialysis access: Left chest TDC c/d/I, LUE AVG no bruit   Imaging: No results found.  Labs: BMET Recent Labs  Lab 08/15/22 0833  NA 132*  K 4.3  CL 91*  CO2 24  GLUCOSE 112*  BUN 70*  CREATININE 8.73*  CALCIUM 9.6    CBC Recent Labs  Lab 08/15/22 0833 08/16/22 0959  WBC 8.6 8.8  NEUTROABS 7.1 6.7  HGB 8.2* 8.4*  HCT 24.2* 26.0*  MCV 108.0* 109.7*  PLT 326 PLATELET CLUMPS NOTED ON SMEAR, COUNT APPEARS ADEQUATE     Medications:     (feeding supplement) PROSource Plus  30 mL Oral BID BM   sodium chloride   Intravenous  Once   acetaminophen  500 mg Oral BID   amiodarone  100 mg Oral Daily   aspirin EC  81 mg Oral Daily   Chlorhexidine Gluconate Cloth  6 each Topical Q0600   diclofenac Sodium  2 g Topical BID   feeding supplement (NEPRO CARB STEADY)  237 mL Oral QK93O  folic acid  1 mg Oral Daily   insulin aspart  0-6 Units Subcutaneous TID WC   levothyroxine  75 mcg Oral QAC breakfast   lisinopril  20 mg Oral Daily   metoprolol tartrate  12.5 mg Oral BID   multivitamin  1 tablet Oral Daily   omeprazole  20 mg Oral BID AC   peg 3350 powder  0.5 kit Oral Once   rosuvastatin  10 mg Oral Daily   sevelamer carbonate  2,400 mg Oral TID with meals   sodium chloride flush  3 mL Intravenous Q12H    LJannifer HickMD [facility] Pager [phone]

## 2022-08-17 NOTE — TOC Transition Note (Signed)
Transition of Care [facility]) - CM/SW Discharge Note   Patient Details  Name: Shawna [name] MRN: [id number] Date of Birth: [birth year]  Transition of Care [facility]) CM/SW Contact:  Milinda Antis, [place] Phone Number: 08/17/2022, 3:01 PM   Clinical Narrative:    [facility] will [facility] to: [facility] date: 08/17/2022 Family notified: Yes, patient's daughter Transport by: [name]   Per MD patient ready for DC to SNF. RN to call report prior to discharge [phone] room 308-1). RN, patient's family, and facility notified of DC. Discharge Summary sent to facility. DC packet on chart. Ambulance transport requested for patient.   CSW will sign off for now as social work intervention is no longer needed. Please consult [place] again if new needs arise.    Final next level of care: Skilled Nursing Facility Barriers to Discharge: Barriers Resolved   Patient Goals and CMS Choice CMS Medicare.gov Compare Post Acute Care list provided to:: Patient Represenative (must comment) Choice offered to / list presented to : Spouse, Adult Children  Discharge Placement                Patient chooses bed at:  ([place]) Patient to be transferred to facility by: [place] Name of family member notified: Patient's daughter [name] Patient and family notified of of transfer: 08/17/22  Discharge Plan and Services Additional resources added to the After Visit Summary for   In-house Referral: Clinical Social Work                                   Social Determinants of Health ([place]) Interventions SDOH Screenings   Food Insecurity: No Food Insecurity (08/06/2022)  Housing: Low Risk  (08/06/2022)  Transportation Needs: No Transportation Needs (08/06/2022)  Utilities: Not At Risk (08/06/2022)  Financial Resource Strain: Low Risk  (12/14/2018)  Physical Activity: Unknown (12/14/2018)  Social Connections: Unknown (12/14/2018)  Stress: No Stress Concern Present (12/14/2018)  Tobacco Use: Low  Risk  (08/11/2022)     Readmission Risk Interventions    11/12/2021    8:56 AM 10/20/2021    4:38 PM 07/23/2021    4:26 PM  Readmission Risk Prevention Plan  Transportation Screening Complete Complete Complete  Medication Review Press photographer) Complete Complete Complete  PCP or Specialist appointment within 3-5 days of discharge Complete Complete Complete  HRI or Home Care Consult Complete Complete Complete  SW Recovery Care/Counseling Consult Complete Complete Complete  Palliative Care Screening Not Applicable Not Applicable Not [place] Complete Not Applicable Not Applicable

## 2022-08-18 NOTE — TOC Progression Note (Signed)
Transition of Care [facility]) - Initial/Assessment Note    Patient Details  Name: Shawna [name] MRN: [id number] Date of Birth: [birth year]  Transition of Care [facility]) CM/SW Contact:    Milinda Antis, [place] Phone Number: 08/18/2022, 9:09 AM  Clinical Narrative:                 LCSW faxed SNF transfer report at the request of [place].   Expected Discharge Plan: Skilled Nursing Facility Barriers to Discharge: Barriers Resolved   Patient Goals and CMS Choice   CMS Medicare.gov Compare Post Acute Care list provided to:: Patient Represenative (must comment) Choice offered to / list presented to : Spouse, Adult Children      Expected Discharge Plan and Services In-house Referral: Clinical Social Work     Living arrangements for the past 2 months: Single Family Home Expected Discharge Date: 08/17/22                                    Prior Living Arrangements/Services Living arrangements for the past 2 months: Single Family Home Lives with:: Spouse Patient language and need for interpreter reviewed:: Yes Do you feel safe going back to the place where you live?: Yes      Need for Family Participation in Patient Care: Yes (Comment) Care giver support system in place?: Yes (comment)   Criminal Activity/Legal Involvement Pertinent to Current Situation/Hospitalization: No - Comment as needed  Activities of Daily Living Home Assistive Devices/Equipment: None ADL Screening (condition at time of admission) Patient's cognitive ability adequate to safely complete daily activities?: Yes Is the patient deaf or have difficulty hearing?: No Does the patient have difficulty seeing, even when wearing glasses/contacts?: No Does the patient have difficulty concentrating, remembering, or making decisions?: Yes Patient able to express need for assistance with ADLs?: Yes Does the patient have difficulty dressing or bathing?: Yes Independently performs ADLs?: No Does the patient  have difficulty walking or climbing stairs?: Yes Weakness of Legs: Both Weakness of Arms/Hands: None  Permission Sought/Granted   Permission granted to share information with : Yes, Verbal Permission Granted (patient disoriented, permission given via spouse)     Permission granted to share info w AGENCY: SNFs        Emotional Assessment       Orientation: : Oriented to Self Alcohol / Substance Use: Not Applicable Psych Involvement: No (comment)  Admission diagnosis:  Generalized weakness [R53.1] Symptomatic anemia [D64.9] Patient Active Problem List   Diagnosis Date Noted   Benign neoplasm of transverse colon 08/11/2022   Benign neoplasm of descending colon 08/11/2022   Benign neoplasm of sigmoid colon 08/11/2022   Angiodysplasia of duodenum 08/11/2022   Delirium 04/22/2022   History of CAD (coronary artery disease) 04/22/2022   NSTEMI, initial episode of care ([place]) 10/19/2021   Symptomatic anemia 09/09/2021   Ischemic cardiomyopathy    Paroxysmal Atrial fibrillation/Flutter 05/07/2021   Neuropathy 04/23/2021   Anemia associated with chronic renal failure 04/13/2021   Left knee pain 03/15/2021   Moderate protein-calorie malnutrition ([place]) 02/27/2021   COVID-19 virus infection 02/03/2021   Leukocytosis 02/03/2021   Hypothyroidism (acquired) 02/03/2021   Myositis 12/03/2020   Ataxia 12/02/2020   Diabetes mellitus type 2 in nonobese ([place]) 11/10/2020   Failure to thrive in adult 11/09/2020   Dialysis AV fistula malfunction, initial encounter ([place])    Jugular vein occlusion, right ([facility])    Failure of  surgically constructed arteriovenous fistula ([place]) 10/03/2020   Myoclonus 08/31/2020   Clotted renal dialysis AV graft, initial encounter ([place])    Hemodialysis-associated hypotension    Irritable bowel syndrome 02/25/2020   Adenomatous duodenal polyp 09/10/2019   History of GI bleed 09/10/2019   Hand steal syndrome ([place]) 08/01/2017   Coronary artery disease 40/98/1191    Complication of vascular access for dialysis 03/19/2017   Preoperative clearance 01/25/2017   H/O non-ST elevation myocardial infarction (NSTEMI) 10/24/2016   Non-ST elevation (NSTEMI) myocardial infarction [facility])    Cardiac arrest [facility])    Palliative care encounter    Goals of care, counseling/discussion    Flash pulmonary edema ([place]) 04/06/2016   History of colon cancer 01/27/2016   History of ovarian cancer 01/27/2016   PAF (paroxysmal atrial fibrillation) ([place]) 10/14/2015   Malignant neoplasm of right ovary ([place]) 10/14/2015   SVT (supraventricular tachycardia) 09/08/2015   Dyspnea    Occlusion and stenosis of carotid artery without mention of cerebral infarction 01/24/2013   Hx of CABG 07/05/2012   Carotid artery disease ([place]) 07/05/2012   Mitral regurgitation 06/12/2012   Non-STEMI (non-ST elevated myocardial infarction) ([place]) 06/08/2012   Chronic combined systolic and diastolic CHF (congestive heart failure) ([place]) 05/02/2012   AVM (arteriovenous malformation) of small bowel, acquired 01/20/2012   GERD (gastroesophageal reflux disease) 01/09/2012   HLD (hyperlipidemia) 01/05/2012   Atherosclerotic heart disease of native coronary artery without angina pectoris 12/16/2011   Anxiety disorder 05/04/2011   ESRD (end stage renal disease) on dialysis ([place]) 04/29/2011   Gout 04/29/2011   PCP:  Practice, [place]:   [facility] [address] [place] [address] Phone: [phone] Fax: [phone]  [facility] (ESRD Bundle Only) - [place], [address] [place] [address] Phone: [phone] Fax: [phone]     Social Determinants of Health ([place]) Social History: [place]: No Food Insecurity (08/06/2022)  Housing: Low Risk  (08/06/2022)  Transportation Needs: No Transportation Needs (08/06/2022)  Utilities: Not At Risk (08/06/2022)  Financial Resource  Strain: Low Risk  (12/14/2018)  Physical Activity: Unknown (12/14/2018)  Social Connections: Unknown (12/14/2018)  Stress: No Stress Concern Present (12/14/2018)  Tobacco Use: Low Risk  (08/11/2022)   SDOH Interventions:     Readmission Risk Interventions    11/12/2021    8:56 AM 10/20/2021    4:38 PM 07/23/2021    4:26 PM  Readmission Risk Prevention Plan  Transportation Screening Complete Complete Complete  Medication Review Press photographer) Complete Complete Complete  PCP or Specialist appointment within 3-5 days of discharge Complete Complete Complete  HRI or Home Care Consult Complete Complete Complete  SW Recovery Care/Counseling Consult Complete Complete Complete  Palliative Care Screening Not Applicable Not Applicable Not Applicable  Skilled Nursing Facility Complete Not Applicable Not Applicable

## 2022-08-26 ENCOUNTER — Inpatient Hospital Stay (HOSPITAL_COMMUNITY)
Admission: EM | Admit: 2022-08-26 | Discharge: 2022-08-29 | DRG: 377 | Disposition: A | Discharge status: Home Health | Payer: Medicare HMO | Source: Ambulatory Visit | Attending: Family Medicine | Admitting: Family Medicine

## 2022-08-26 ENCOUNTER — Emergency Department (HOSPITAL_COMMUNITY): Payer: Medicare HMO

## 2022-08-26 DIAGNOSIS — Z91041 Radiographic dye allergy status: Secondary | ICD-10-CM

## 2022-08-26 DIAGNOSIS — R410 Disorientation, unspecified: Secondary | ICD-10-CM | POA: Diagnosis present

## 2022-08-26 DIAGNOSIS — M898X9 Other specified disorders of bone, unspecified site: Secondary | ICD-10-CM | POA: Diagnosis present

## 2022-08-26 DIAGNOSIS — K5521 Angiodysplasia of colon with hemorrhage: Secondary | ICD-10-CM | POA: Diagnosis present

## 2022-08-26 DIAGNOSIS — I5042 Chronic combined systolic (congestive) and diastolic (congestive) heart failure: Secondary | ICD-10-CM | POA: Diagnosis present

## 2022-08-26 DIAGNOSIS — I480 Paroxysmal atrial fibrillation: Secondary | ICD-10-CM | POA: Diagnosis present

## 2022-08-26 DIAGNOSIS — I340 Nonrheumatic mitral (valve) insufficiency: Secondary | ICD-10-CM | POA: Diagnosis present

## 2022-08-26 DIAGNOSIS — E039 Hypothyroidism, unspecified: Secondary | ICD-10-CM | POA: Diagnosis present

## 2022-08-26 DIAGNOSIS — K5520 Angiodysplasia of colon without hemorrhage: Secondary | ICD-10-CM | POA: Diagnosis not present

## 2022-08-26 DIAGNOSIS — D500 Iron deficiency anemia secondary to blood loss (chronic): Principal | ICD-10-CM | POA: Diagnosis present

## 2022-08-26 DIAGNOSIS — Z955 Presence of coronary angioplasty implant and graft: Secondary | ICD-10-CM

## 2022-08-26 DIAGNOSIS — N186 End stage renal disease: Secondary | ICD-10-CM | POA: Diagnosis not present

## 2022-08-26 DIAGNOSIS — Z888 Allergy status to other drugs, medicaments and biological substances status: Secondary | ICD-10-CM

## 2022-08-26 DIAGNOSIS — F419 Anxiety disorder, unspecified: Secondary | ICD-10-CM | POA: Diagnosis present

## 2022-08-26 DIAGNOSIS — Z7982 Long term (current) use of aspirin: Secondary | ICD-10-CM

## 2022-08-26 DIAGNOSIS — Z992 Dependence on renal dialysis: Secondary | ICD-10-CM | POA: Diagnosis not present

## 2022-08-26 DIAGNOSIS — N2581 Secondary hyperparathyroidism of renal origin: Secondary | ICD-10-CM | POA: Diagnosis present

## 2022-08-26 DIAGNOSIS — K31819 Angiodysplasia of stomach and duodenum without bleeding: Secondary | ICD-10-CM | POA: Diagnosis present

## 2022-08-26 DIAGNOSIS — R195 Other fecal abnormalities: Secondary | ICD-10-CM | POA: Diagnosis not present

## 2022-08-26 DIAGNOSIS — E1122 Type 2 diabetes mellitus with diabetic chronic kidney disease: Secondary | ICD-10-CM | POA: Diagnosis present

## 2022-08-26 DIAGNOSIS — D631 Anemia in chronic kidney disease: Secondary | ICD-10-CM | POA: Diagnosis present

## 2022-08-26 DIAGNOSIS — Z8616 Personal history of COVID-19: Secondary | ICD-10-CM

## 2022-08-26 DIAGNOSIS — E7800 Pure hypercholesterolemia, unspecified: Secondary | ICD-10-CM | POA: Diagnosis present

## 2022-08-26 DIAGNOSIS — I2510 Atherosclerotic heart disease of native coronary artery without angina pectoris: Secondary | ICD-10-CM | POA: Diagnosis present

## 2022-08-26 DIAGNOSIS — E876 Hypokalemia: Secondary | ICD-10-CM | POA: Diagnosis present

## 2022-08-26 DIAGNOSIS — Z800 Family history of malignant neoplasm of digestive organs: Secondary | ICD-10-CM

## 2022-08-26 DIAGNOSIS — K449 Diaphragmatic hernia without obstruction or gangrene: Secondary | ICD-10-CM | POA: Diagnosis present

## 2022-08-26 DIAGNOSIS — Z886 Allergy status to analgesic agent status: Secondary | ICD-10-CM

## 2022-08-26 DIAGNOSIS — R627 Adult failure to thrive: Secondary | ICD-10-CM | POA: Diagnosis present

## 2022-08-26 DIAGNOSIS — Z833 Family history of diabetes mellitus: Secondary | ICD-10-CM

## 2022-08-26 DIAGNOSIS — Z885 Allergy status to narcotic agent status: Secondary | ICD-10-CM

## 2022-08-26 DIAGNOSIS — Z79899 Other long term (current) drug therapy: Secondary | ICD-10-CM

## 2022-08-26 DIAGNOSIS — F0150 Vascular dementia without behavioral disturbance: Secondary | ICD-10-CM | POA: Diagnosis present

## 2022-08-26 DIAGNOSIS — I5022 Chronic systolic (congestive) heart failure: Secondary | ICD-10-CM | POA: Diagnosis present

## 2022-08-26 DIAGNOSIS — D62 Acute posthemorrhagic anemia: Secondary | ICD-10-CM | POA: Diagnosis not present

## 2022-08-26 DIAGNOSIS — Z8249 Family history of ischemic heart disease and other diseases of the circulatory system: Secondary | ICD-10-CM

## 2022-08-26 DIAGNOSIS — I252 Old myocardial infarction: Secondary | ICD-10-CM

## 2022-08-26 DIAGNOSIS — I132 Hypertensive heart and chronic kidney disease with heart failure and with stage 5 chronic kidney disease, or end stage renal disease: Secondary | ICD-10-CM | POA: Diagnosis present

## 2022-08-26 DIAGNOSIS — K219 Gastro-esophageal reflux disease without esophagitis: Secondary | ICD-10-CM | POA: Diagnosis present

## 2022-08-26 DIAGNOSIS — Z881 Allergy status to other antibiotic agents status: Secondary | ICD-10-CM

## 2022-08-26 DIAGNOSIS — N189 Chronic kidney disease, unspecified: Secondary | ICD-10-CM | POA: Diagnosis present

## 2022-08-26 DIAGNOSIS — Z8349 Family history of other endocrine, nutritional and metabolic diseases: Secondary | ICD-10-CM

## 2022-08-26 DIAGNOSIS — Z8543 Personal history of malignant neoplasm of ovary: Secondary | ICD-10-CM

## 2022-08-26 DIAGNOSIS — Z951 Presence of aortocoronary bypass graft: Secondary | ICD-10-CM

## 2022-08-26 DIAGNOSIS — Z8673 Personal history of transient ischemic attack (TIA), and cerebral infarction without residual deficits: Secondary | ICD-10-CM

## 2022-08-26 DIAGNOSIS — Z79890 Hormone replacement therapy (postmenopausal): Secondary | ICD-10-CM

## 2022-08-26 DIAGNOSIS — Z7901 Long term (current) use of anticoagulants: Secondary | ICD-10-CM

## 2022-08-26 DIAGNOSIS — Z85038 Personal history of other malignant neoplasm of large intestine: Secondary | ICD-10-CM

## 2022-08-26 DIAGNOSIS — K31811 Angiodysplasia of stomach and duodenum with bleeding: Principal | ICD-10-CM | POA: Diagnosis present

## 2022-08-26 LAB — COMPREHENSIVE METABOLIC PANEL
ALT: 17 U/L (ref 0–44)
AST: 26 U/L (ref 15–41)
Albumin: 3.1 g/dL — ABNORMAL LOW (ref 3.5–5.0)
Alkaline Phosphatase: 85 U/L (ref 38–126)
Anion gap: 9 (ref 5–15)
BUN: 14 mg/dL (ref 8–23)
CO2: 27 mmol/L (ref 22–32)
Calcium: 8.3 mg/dL — ABNORMAL LOW (ref 8.9–10.3)
Chloride: 99 mmol/L (ref 98–111)
Creatinine, Ser: 3.2 mg/dL — ABNORMAL HIGH (ref 0.44–1.00)
GFR, Estimated: 14 mL/min — ABNORMAL LOW (ref >60)
Glucose, Bld: 105 mg/dL — ABNORMAL HIGH (ref 70–99)
Potassium: 3.3 mmol/L — ABNORMAL LOW (ref 3.5–5.1)
Sodium: 135 mmol/L (ref 135–145)
Total Bilirubin: 0.5 mg/dL (ref 0.3–1.2)
Total Protein: 6.2 g/dL — ABNORMAL LOW (ref 6.5–8.1)

## 2022-08-26 LAB — CBC WITH DIFFERENTIAL/PLATELET
Abs Immature Granulocytes: 0.02 10*3/uL (ref 0.00–0.07)
Basophils Absolute: 0.1 10*3/uL (ref 0.0–0.1)
Basophils Relative: 1 %
Eosinophils Absolute: 0.2 10*3/uL (ref 0.0–0.5)
Eosinophils Relative: 3 %
HCT: 21 % — ABNORMAL LOW (ref 36.0–46.0)
Hemoglobin: 6.6 g/dL — CL (ref 12.0–15.0)
Immature Granulocytes: 0 %
Lymphocytes Relative: 13 %
Lymphs Abs: 0.9 10*3/uL (ref 0.7–4.0)
MCH: 36.3 pg — ABNORMAL HIGH (ref 26.0–34.0)
MCHC: 31.4 g/dL (ref 30.0–36.0)
MCV: 115.4 fL — ABNORMAL HIGH (ref 80.0–100.0)
Monocytes Absolute: 0.9 10*3/uL (ref 0.1–1.0)
Monocytes Relative: 14 %
Neutro Abs: 4.6 10*3/uL (ref 1.7–7.7)
Neutrophils Relative %: 69 %
Platelets: 306 10*3/uL (ref 150–400)
RBC: 1.82 MIL/uL — ABNORMAL LOW (ref 3.87–5.11)
RDW: 21.9 % — ABNORMAL HIGH (ref 11.5–15.5)
WBC: 6.6 10*3/uL (ref 4.0–10.5)
nRBC: 0 % (ref 0.0–0.2)

## 2022-08-26 LAB — HEPATITIS B SURFACE ANTIGEN: Hepatitis B Surface Ag: NONREACTIVE

## 2022-08-26 LAB — BRAIN NATRIURETIC PEPTIDE: B Natriuretic Peptide: 1590 pg/mL — ABNORMAL HIGH (ref 0.0–100.0)

## 2022-08-26 LAB — TROPONIN I (HIGH SENSITIVITY)
Troponin I (High Sensitivity): 152 ng/L (ref <18)
Troponin I (High Sensitivity): 152 ng/L (ref <18)

## 2022-08-26 LAB — AMMONIA: Ammonia: 17 umol/L (ref 9–35)

## 2022-08-26 LAB — LACTIC ACID, PLASMA
Lactic Acid, Venous: 1.4 mmol/L (ref 0.5–1.9)
Lactic Acid, Venous: 1.3 mmol/L (ref 0.5–1.9)

## 2022-08-26 LAB — TSH: TSH: 4.745 u[IU]/mL — ABNORMAL HIGH (ref 0.350–4.500)

## 2022-08-26 LAB — POC OCCULT BLOOD, ED

## 2022-08-26 LAB — PREPARE RBC (CROSSMATCH)

## 2022-08-26 MED ORDER — SODIUM CHLORIDE 0.9% IV SOLUTION: Freq: Once | INTRAVENOUS | Status: AC

## 2022-08-26 MED ORDER — METOPROLOL TARTRATE 5 MG/5ML IV SOLN
5.0000 mg | Freq: Once | INTRAVENOUS | Status: AC
  Administered 2022-08-26: 5 mg via INTRAVENOUS
  Filled 2022-08-26: qty 5

## 2022-08-26 MED ORDER — LACTATED RINGERS IV BOLUS
500.0000 mL | Freq: Once | INTRAVENOUS | Status: AC
  Administered 2022-08-26: 500 mL via INTRAVENOUS

## 2022-08-26 MED ORDER — METOPROLOL TARTRATE 25 MG PO TABS: 25.0000 mg | ORAL_TABLET | Freq: Once | ORAL | Status: DC

## 2022-08-26 MED ORDER — CHLORHEXIDINE GLUCONATE CLOTH 2 % EX PADS
6.0000 | MEDICATED_PAD | Freq: Every day | CUTANEOUS | Status: DC
  Administered 2022-08-28 – 2022-08-29 (×2): 6 via TOPICAL

## 2022-08-26 NOTE — Progress Notes (Signed)
Patient has electronic blood consent in chart.

## 2022-08-26 NOTE — ED Provider Notes (Signed)
[facility] Provider Note  CSN: [id number] Arrival date & time: 08/26/22 1327  Chief Complaint(s) Tachycardia  HPI Shawna [name] is a 82 y.o. female with PMH CAD status post CABG, CHF, ESRD on hemodialysis Monday Wednesday Friday, lower GI bleed, iron deficiency anemia, paroxysmal A-fib who presents emergency department for evaluation of tachycardia and syncope during dialysis.  History obtained from EMS who states that they received a call from dialysis as the patient was halfway through her session when she became altered and was found to have a heart rate greater than 120.  Patient arrives somnolent and unable to give additional history.  She arrives tachycardic but is maintaining her blood pressures without difficulty and is saturating well on 2 L nasal cannula.   Past Medical History Past Medical History:  Diagnosis Date   Acute on chronic respiratory failure with hypoxia ([place]) 10/10/2016   Anxiety    Arthritis    AVM (arteriovenous malformation) of colon    CAD (coronary artery disease)    a. s/p CABG in 2013 b. DES to D1 in 10/2016. c. cath in 07/2018 showing patent grafts with occlusion of D1 at prior stent site and progression of PDA disease --> medical management recommended   Carotid artery disease ([place])    a. [id number] LICA, [id number]    Chronic bronchitis (HCC)    Chronic HFrEF (heart failure with reduced ejection fraction) ([place])    Colon cancer ([place]) 1992   Esophageal stricture    ESRD on hemodialysis ([place])    ESRD due to HTN, started dialysis 2011 and gets HD at [facility] with Dr Hinda Lenis on MWF schedule.  Access is LUA AVF as of Sept 2014.    GERD (gastroesophageal reflux disease)    High cholesterol 12/2011   History of blood transfusion 07/2011; 12/2011; 01/2012 X 2; 04/2012   History of gout    History of lower GI bleeding    Hypertension    Iron deficiency anemia    Jugular vein occlusion, right (HCC)    Mitral regurgitation    a. Moderate by  echo, 02/2012   Mitral valve disease    NSVT (nonsustained ventricular tachycardia) (HCC)    Ovarian cancer ([place]) 1992   PAF (paroxysmal atrial fibrillation) ([place])    Pneumonia ~ 2009   PUD (peptic ulcer disease)    TIA (transient ischemic attack)    Tricuspid valve disease    Patient Active Problem List   Diagnosis Date Noted   Acute on chronic blood loss anemia 08/26/2022   Benign neoplasm of transverse colon 08/11/2022   Benign neoplasm of descending colon 08/11/2022   Benign neoplasm of sigmoid colon 08/11/2022   Angiodysplasia of duodenum 08/11/2022   Delirium 04/22/2022   History of CAD (coronary artery disease) 04/22/2022   NSTEMI, initial episode of care ([place]) 10/19/2021   Symptomatic anemia 09/09/2021   Ischemic cardiomyopathy    Paroxysmal Atrial fibrillation/Flutter 05/07/2021   Neuropathy 04/23/2021   Anemia associated with chronic renal failure 04/13/2021   Left knee pain 03/15/2021   Moderate protein-calorie malnutrition ([place]) 02/27/2021   COVID-19 virus infection 02/03/2021   Leukocytosis 02/03/2021   Hypothyroidism (acquired) 02/03/2021   Myositis 12/03/2020   Ataxia 12/02/2020   Diabetes mellitus type 2 in nonobese ([place]) 11/10/2020   Failure to thrive in adult 11/09/2020   Dialysis AV fistula malfunction, initial encounter ([place])    Jugular vein occlusion, right (HCC)    Failure of surgically constructed arteriovenous fistula ([place]) 10/03/2020  Myoclonus 08/31/2020   Clotted renal dialysis AV graft, initial encounter ([place])    Hemodialysis-associated hypotension    Irritable bowel syndrome 02/25/2020   Adenomatous duodenal polyp 09/10/2019   History of GI bleed 09/10/2019   Hand steal syndrome ([place]) 08/01/2017   Coronary artery disease [id number]   Complication of vascular access for dialysis 03/19/2017   Preoperative clearance 01/25/2017   H/O non-ST elevation myocardial infarction (NSTEMI) 10/24/2016   Non-ST elevation (NSTEMI) myocardial infarction  [facility])    Cardiac arrest [facility])    Palliative care encounter    Goals of care, counseling/discussion    Flash pulmonary edema ([place]) 04/06/2016   History of colon cancer 01/27/2016   History of ovarian cancer 01/27/2016   PAF (paroxysmal atrial fibrillation) ([place]) 10/14/2015   Malignant neoplasm of right ovary ([place]) 10/14/2015   SVT (supraventricular tachycardia) 09/08/2015   Dyspnea    Occlusion and stenosis of carotid artery without mention of cerebral infarction 01/24/2013   Hx of CABG 07/05/2012   Carotid artery disease ([place]) 07/05/2012   Mitral regurgitation 06/12/2012   Non-STEMI (non-ST elevated myocardial infarction) ([place]) 06/08/2012   Chronic combined systolic and diastolic CHF (congestive heart failure) ([place]) 05/02/2012   AVM (arteriovenous malformation) of small bowel, acquired 01/20/2012   GERD (gastroesophageal reflux disease) 01/09/2012   HLD (hyperlipidemia) 01/05/2012   Atherosclerotic heart disease of native coronary artery without angina pectoris 12/16/2011   Anxiety disorder 05/04/2011   ESRD (end stage renal disease) on dialysis ([place]) 04/29/2011   Gout 04/29/2011   Home Medication(s) Prior to Admission medications   Medication Sig Start Date End Date Taking? Authorizing Provider  ALPRAZolam Duanne Moron) 0.5 MG tablet Take 1 tablet (0.5 mg total) by mouth daily as needed for anxiety. 08/17/22  Yes Donne Hazel, MD  amiodarone (PACERONE) 100 MG tablet Take 1 tablet (100 mg total) by mouth daily. 04/27/22  Yes Nita Sells, MD  Aspirin 81 MG CAPS Take 1 capsule by mouth daily at 12 noon.   Yes [provider]  benzonatate (TESSALON) 100 MG capsule Take 100 mg by mouth 3 (three) times daily as needed for cough. 05/10/22  Yes [provider]  folic acid (FOLVITE) 1 MG tablet Take 1 mg by mouth daily. 05/24/22  Yes [provider]  hydrOXYzine (ATARAX) 25 MG tablet Take 25 mg by mouth every 6 (six) hours as needed for itching. 06/06/22  Yes  [provider]  levothyroxine (SYNTHROID) 75 MCG tablet Take 1 tablet (75 mcg total) by mouth daily before breakfast. 08/14/22 09/13/22 Yes Pahwani, Einar Grad, MD  lisinopril (ZESTRIL) 20 MG tablet Take 1 tablet (20 mg total) by mouth daily. 08/18/22 09/17/22 Yes Donne Hazel, MD  metoprolol tartrate (LOPRESSOR) 25 MG tablet Take 0.5 tablets (12.5 mg total) by mouth 2 (two) times daily. 03/04/22  Yes BranchAlphonse Guild, MD  multivitamin (RENA-VIT) TABS tablet Take 1 tablet by mouth daily.   Yes [provider]  nitroGLYCERIN (NITROSTAT) 0.4 MG SL tablet Place 0.4 mg under the tongue every 5 (five) minutes as needed for chest pain.   Yes [provider]  omeprazole (PRILOSEC) 20 MG capsule Take 2 capsules (40 mg total) by mouth daily as needed (acid reflux). Patient taking differently: Take 20 mg by mouth daily. 06/12/22  Yes Aline August, MD  ondansetron (ZOFRAN-ODT) 4 MG disintegrating tablet Take 4 mg by mouth every 8 (eight) hours as needed for nausea or vomiting. 05/05/22  Yes [provider]  rosuvastatin (CRESTOR) 10 MG tablet Take  1 tablet by mouth once daily 06/06/22  Yes Strader, [place], PA-C  sevelamer carbonate (RENVELA) 800 MG tablet Take 3,200 mg by mouth in the morning and at bedtime. 12/27/21  Yes [provider]                                                                                                                                    Past Surgical History Past Surgical History:  Procedure Laterality Date   A/V SHUNTOGRAM Left 03/19/2019   Procedure: A/V SHUNTOGRAM;  Surgeon: Katha Cabal, MD;  Location: [facility];  Service: Cardiovascular;  Laterality: Left;   ABDOMINAL HYSTERECTOMY  1992   APPENDECTOMY  06/1990   AV FISTULA PLACEMENT  07/2009   left upper arm   AV FISTULA PLACEMENT Right 09/06/2016   Procedure: RIGHT FOREARM ARTERIOVENOUS (AV) GRAFT;  Surgeon: Elam Dutch, MD;  Location: [place];  Service: Vascular;   Laterality: Right;   AV FISTULA PLACEMENT N/A 02/24/2017   Procedure: INSERTION OF ARTERIOVENOUS (AV) GORE-TEX GRAFT ARM (BRACHIAL AXILLARY);  Surgeon: Katha Cabal, MD;  Location: [facility];  Service: Vascular;  Laterality: N/A;   [place] Right 09/06/2016   Procedure: REMOVAL OF Right Arm ARTERIOVENOUS GORETEX GRAFT and Vein Patch angioplasty of brachial artery;  Surgeon: Angelia Mould, MD;  Location: [place];  Service: Vascular;  Laterality: Right;   BIOPSY  09/26/2019   Procedure: BIOPSY;  Surgeon: Rogene Houston, MD;  [facility];  Service: Endoscopy;;   COLON RESECTION  1992   COLON SURGERY     COLONOSCOPY N/A 03/09/2019   Procedure: COLONOSCOPY;  Surgeon: Rogene Houston, MD;  [facility];  Service: Endoscopy;  Laterality: N/A;   COLONOSCOPY N/A 08/11/2022   Procedure: COLONOSCOPY;  Surgeon: Jerene Bears, MD;  Location: [place];  Service: Gastroenterology;  Laterality: N/A;   COLONOSCOPY WITH PROPOFOL N/A 06/08/2021   Procedure: COLONOSCOPY WITH PROPOFOL;  Surgeon: Harvel Quale, MD;  [facility];  Service: Gastroenterology;  Laterality: N/A;  9:05 /Patient is on dialysis Mon Wed Fri   CORONARY ANGIOPLASTY WITH STENT PLACEMENT  12/15/11   "2"   CORONARY ANGIOPLASTY WITH STENT PLACEMENT  y/2013   "1; makes total of 3" (05/02/2012)   CORONARY ARTERY BYPASS GRAFT  06/13/2012   Procedure: CORONARY ARTERY BYPASS GRAFTING (CABG);  Surgeon: Grace Isaac, MD;  Location: [facility];  Service: Open Heart Surgery;  Laterality: N/A;  cabg x four;  using left internal mammary artery, and left leg greater saphenous vein harvested endoscopically   CORONARY STENT INTERVENTION N/A 10/13/2016   Procedure: Coronary Stent Intervention;  Surgeon: Troy Sine, MD;  Location: [place] [facility];  Service: Cardiovascular;  Laterality: N/A;   DIALYSIS/PERMA CATHETER REMOVAL N/A 04/18/2017   Procedure: DIALYSIS/PERMA CATHETER REMOVAL;  Surgeon:  Katha Cabal, MD;  Location: [place] [facility];  Service: Cardiovascular;  Laterality: N/A;   DILATION AND CURETTAGE OF UTERUS     ENTEROSCOPY N/A 06/08/2021   Procedure: PUSH ENTEROSCOPY;  Surgeon: Harvel Quale, MD;  [facility];  Service: Gastroenterology;  Laterality: N/A;   ESOPHAGOGASTRODUODENOSCOPY  01/20/2012   Procedure: ESOPHAGOGASTRODUODENOSCOPY (EGD);  Surgeon: Ladene Artist, MD,FACG;  Location: [facility] ENDOSCOPY;  Service: Endoscopy;  Laterality: N/A;   ESOPHAGOGASTRODUODENOSCOPY N/A 03/26/2013   Procedure: ESOPHAGOGASTRODUODENOSCOPY (EGD);  Surgeon: Irene Shipper, MD;  Location: [facility];  Service: Endoscopy;  Laterality: N/A;   ESOPHAGOGASTRODUODENOSCOPY N/A 04/30/2015   Procedure: ESOPHAGOGASTRODUODENOSCOPY (EGD);  Surgeon: Rogene Houston, MD;  [facility];  Service: Endoscopy;  Laterality: N/A;  1pm - moved to 10/20 @ 1:10   ESOPHAGOGASTRODUODENOSCOPY N/A 07/29/2016   Procedure: ESOPHAGOGASTRODUODENOSCOPY (EGD);  Surgeon: Manus Gunning, MD;  Location: [place];  Service: Gastroenterology;  Laterality: N/A;  enteroscopy   ESOPHAGOGASTRODUODENOSCOPY N/A 09/26/2019   Procedure: ESOPHAGOGASTRODUODENOSCOPY (EGD);  Surgeon: Rogene Houston, MD;  [facility];  Service: Endoscopy;  Laterality: N/A;  1250   ESOPHAGOGASTRODUODENOSCOPY N/A 08/11/2022   Procedure: ESOPHAGOGASTRODUODENOSCOPY (EGD);  Surgeon: Jerene Bears, MD;  Location: [facility] ENDOSCOPY;  Service: Gastroenterology;  Laterality: N/A;   ESOPHAGOGASTRODUODENOSCOPY (EGD) WITH PROPOFOL N/A 02/05/2021   Procedure: ESOPHAGOGASTRODUODENOSCOPY (EGD) WITH PROPOFOL;  Surgeon: Eloise Harman, DO;  [facility];  Service: Endoscopy;  Laterality: N/A;   GIVENS CAPSULE STUDY N/A 03/07/2019   Procedure: GIVENS CAPSULE STUDY;  Surgeon: Rogene Houston, MD;  [facility];  Service: Endoscopy;  Laterality: N/A;  7:30   GIVENS CAPSULE STUDY N/A 04/22/2021    Procedure: GIVENS CAPSULE STUDY;  Surgeon: Rogene Houston, MD;  [facility];  Service: Endoscopy;  Laterality: N/A;  7:30   HOT HEMOSTASIS N/A 08/11/2022   Procedure: HOT HEMOSTASIS (ARGON PLASMA COAGULATION/BICAP);  Surgeon: Jerene Bears, MD;  Location: [facility] ENDOSCOPY;  Service: Gastroenterology;  Laterality: N/A;   INSERTION OF DIALYSIS CATHETER N/A 10/05/2020   Procedure: ABORTED TUNNELED DIALYSIS CATHETER PLACEMENT RIGHT INTERNAL JUGULAR VEIN ;  Surgeon: Virl Cagey, MD;  Location[facility];  Service: General;  Laterality: N/A;   INTRAOPERATIVE TRANSESOPHAGEAL ECHOCARDIOGRAM  06/13/2012   Procedure: INTRAOPERATIVE TRANSESOPHAGEAL ECHOCARDIOGRAM;  Surgeon: Grace Isaac, MD;  Location: [place];  Service: Open Heart Surgery;  Laterality: N/A;   IR DIALY SHUNT INTRO NEEDLE/INTRACATH INITIAL W/IMG LEFT Left 10/06/2020   IR FLUORO GUIDE CV LINE RIGHT  06/17/2020   IR GENERIC HISTORICAL  07/26/2016   IR FLUORO GUIDE CV LINE RIGHT 07/26/2016 Greggory Keen, MD [facility]   IR GENERIC HISTORICAL  07/26/2016   IR US GUIDE VASC ACCESS RIGHT 07/26/2016 Greggory Keen, MD [facility]   IR GENERIC HISTORICAL  08/02/2016   IR US GUIDE VASC ACCESS RIGHT 08/02/2016 Greggory Keen, MD [facility]   IR GENERIC HISTORICAL  08/02/2016   IR FLUORO GUIDE CV LINE RIGHT 08/02/2016 Greggory Keen, MD [facility]   IR RADIOLOGY PERIPHERAL GUIDED IV START  03/28/2017   IR REMOVAL TUN CV CATH W/O FL  08/11/2020   IR THROMBECTOMY AV FISTULA W/THROMBOLYSIS INC/SHUNT/IMG LEFT Left 06/17/2020   IR US GUIDE VASC ACCESS LEFT  06/17/2020   IR US GUIDE VASC ACCESS RIGHT  03/28/2017   IR US GUIDE VASC ACCESS RIGHT  06/17/2020   LEFT HEART CATH AND CORONARY ANGIOGRAPHY N/A 09/20/2016   Procedure: Left Heart Cath and Coronary Angiography;  Surgeon: [name], MD;  Location: [place] [facility];  Service: Cardiovascular;  Laterality: N/A;   LEFT HEART CATH AND CORS/GRAFTS ANGIOGRAPHY N/A 10/13/2016   Procedure: Left Heart  Cath and Cors/Grafts Angiography;  Surgeon: Troy Sine, MD;  Location: [place] [facility];  Service: Cardiovascular;  Laterality: N/A;   LEFT HEART CATH AND CORS/GRAFTS ANGIOGRAPHY N/A 07/13/2018   Procedure: LEFT HEART CATH AND CORS/GRAFTS ANGIOGRAPHY;  Surgeon: Martinique, Peter M, MD;  Location: [place] [facility];  Service: Cardiovascular;  Laterality: N/A;   LEFT HEART CATH AND CORS/GRAFTS ANGIOGRAPHY N/A 07/22/2021   Procedure: LEFT HEART CATH AND CORS/GRAFTS ANGIOGRAPHY;  Surgeon: Lorretta Harp, MD;  Location: [place] [facility];  Service: Cardiovascular;  Laterality: N/A;   LEFT HEART CATHETERIZATION WITH CORONARY ANGIOGRAM N/A 12/15/2011   Procedure: LEFT HEART CATHETERIZATION WITH CORONARY ANGIOGRAM;  Surgeon: Burnell Blanks, MD;  Location: [facility] CATH LAB;  Service: Cardiovascular;  Laterality: N/A;   LEFT HEART CATHETERIZATION WITH CORONARY ANGIOGRAM N/A 01/10/2012   Procedure: LEFT HEART CATHETERIZATION WITH CORONARY ANGIOGRAM;  Surgeon: Peter M Martinique, MD;  Location: [facility] CATH LAB;  Service: Cardiovascular;  Laterality: N/A;   LEFT HEART CATHETERIZATION WITH CORONARY ANGIOGRAM N/A 06/08/2012   Procedure: LEFT HEART CATHETERIZATION WITH CORONARY ANGIOGRAM;  Surgeon: Burnell Blanks, MD;  Location: [facility] CATH LAB;  Service: Cardiovascular;  Laterality: N/A;   LEFT HEART CATHETERIZATION WITH CORONARY/GRAFT ANGIOGRAM N/A 12/10/2013   Procedure: LEFT HEART CATHETERIZATION WITH Beatrix Fetters;  Surgeon: [name], MD;  Location: [facility] CATH LAB;  Service: Cardiovascular;  Laterality: N/A;   OVARY SURGERY     ovarian cancer   POLYPECTOMY  03/09/2019   Procedure: POLYPECTOMY;  Surgeon: Rogene Houston, MD;  [facility];  Service: Endoscopy;;  cecal    POLYPECTOMY N/A 09/26/2019   Procedure: DUODENAL POLYPECTOMY;  Surgeon: Rogene Houston, MD;  [facility];  Service: Endoscopy;  Laterality: N/A;   POLYPECTOMY  08/11/2022   Procedure: POLYPECTOMY;   Surgeon: Jerene Bears, MD;  Location: [place];  Service: Gastroenterology;;   REVISION OF ARTERIOVENOUS GORETEX GRAFT N/A 02/24/2017   Procedure: REVISION OF ARTERIOVENOUS GORETEX GRAFT (RESECTION);  Surgeon: Katha Cabal, MD;  Location: [facility];  Service: Vascular;  Laterality: N/A;   REVISON OF ARTERIOVENOUS FISTULA Left 06/19/2020   Procedure: REVISION OF LEFT UPPER ARM AV GRAFT WITH INTERPOSITION JUMP GRAFT USING 6MM GORE LIMB;  Surgeon: Marty Heck, MD;  Location: [place];  Service: Vascular;  Laterality: Left;   SHUNTOGRAM N/A 10/15/2013   Procedure: Fistulogram;  Surgeon: Serafina Mitchell, MD;  Location: [facility] CATH LAB;  Service: Cardiovascular;  Laterality: N/A;   THROMBECTOMY / ARTERIOVENOUS GRAFT REVISION  2011   left upper arm   TUBAL LIGATION  1980's   UPPER EXTREMITY ANGIOGRAPHY Bilateral 12/06/2016   Procedure: Upper Extremity Angiography;  Surgeon: Katha Cabal, MD;  Location: [place] [facility];  Service: Cardiovascular;  Laterality: Bilateral;   UPPER EXTREMITY INTERVENTION Left 06/06/2017   Procedure: UPPER EXTREMITY INTERVENTION;  Surgeon: Katha Cabal, MD;  Location: [place] [facility];  Service: Cardiovascular;  Laterality: Left;   Family History Family History  Problem Relation Age of Onset   Heart disease Mother        Heart Disease before age 23   Hyperlipidemia Mother    Hypertension Mother    Diabetes Mother    Heart attack Mother    Heart disease Father        Heart  Disease before age 53   Hyperlipidemia Father    Hypertension Father    Diabetes Father    Diabetes Sister    Hypertension Sister    Diabetes Brother    Hyperlipidemia Brother    Heart attack Brother    Hypertension Sister    Heart attack Brother    Colon cancer Child 56   Other Other        noncontributory for early CAD   Esophageal cancer Neg Hx    Liver disease Neg Hx    Kidney disease Neg Hx    Colon polyps Neg Hx     Social History Social History    Tobacco Use   Smoking status: Never   Smokeless tobacco: Never  Vaping Use   Vaping Use: Never used  Substance Use Topics   Alcohol use: No    Alcohol/week: 0.0 standard drinks of alcohol   Drug use: No   Allergies Amlodipine, Aspirin, Nitrofurantoin, Bactrim [sulfamethoxazole-trimethoprim], Contrast media [iodinated contrast media], Iron, Tylenol [acetaminophen], Gabapentin, Iron sucrose, Ranexa [ranolazine], Sucroferric oxyhydroxide, Dexilant [dexlansoprazole], Hydralazine, Levaquin [levofloxacin in d5w], Morphine and related, Pantoprazole, Plavix [clopidogrel bisulfate], Protonix [pantoprazole sodium], and Venofer [ferric oxide]  Review of Systems Review of Systems  Unable to perform ROS: Mental status change    Physical Exam Vital Signs  I have reviewed the triage vital signs BP (!) 149/84   Pulse (!) 117   Temp 97.6 F (36.4 C) (Oral)   Resp 20   Ht 5' 2"$  (1.575 m)   SpO2 100%   BMI 21.90 kg/m   Physical Exam Vitals and nursing note reviewed.  Constitutional:      General: She is not in acute distress.    Appearance: She is well-developed. She is ill-appearing.  HENT:     Head: Normocephalic and atraumatic.  Eyes:     Conjunctiva/sclera: Conjunctivae normal.  Cardiovascular:     Rate and Rhythm: Regular rhythm. Tachycardia present.     Heart sounds: No murmur heard. Pulmonary:     Effort: Pulmonary effort is normal. No respiratory distress.     Breath sounds: Normal breath sounds.  Abdominal:     Palpations: Abdomen is soft.     Tenderness: There is no abdominal tenderness.  Musculoskeletal:        General: No swelling.     Cervical back: Neck supple.  Skin:    General: Skin is warm and dry.     Capillary Refill: Capillary refill takes less than 2 seconds.     Coloration: Skin is pale.  Neurological:     Mental Status: She is alert. She is disoriented.  Psychiatric:        Mood and Affect: Mood normal.     ED Results and Treatments Labs (all  labs ordered are listed, but only abnormal results are displayed) Labs Reviewed  COMPREHENSIVE METABOLIC PANEL - Abnormal; Notable for the following components:      Result Value   Potassium 3.3 (*)    Glucose, Bld 105 (*)    Creatinine, Ser 3.20 (*)    Calcium 8.3 (*)    Total Protein 6.2 (*)    Albumin 3.1 (*)    GFR, Estimated 14 (*)    All other components within normal limits  CBC WITH DIFFERENTIAL/PLATELET - Abnormal; Notable for the following components:   RBC 1.82 (*)    Hemoglobin 6.6 (*)    HCT 21.0 (*)    MCV 115.4 (*)    MCH 36.3 (*)  RDW 21.9 (*)    All other components within normal limits  BRAIN NATRIURETIC PEPTIDE - Abnormal; Notable for the following components:   B Natriuretic Peptide 1,590.0 (*)    All other components within normal limits  TSH - Abnormal; Notable for the following components:   TSH 4.745 (*)    All other components within normal limits  POC OCCULT BLOOD, ED - Abnormal  TROPONIN I (HIGH SENSITIVITY) - Abnormal; Notable for the following components:   Troponin I (High Sensitivity) 152 (*)    All other components within normal limits  TROPONIN I (HIGH SENSITIVITY) - Abnormal; Notable for the following components:   Troponin I (High Sensitivity) 152 (*)    All other components within normal limits  LACTIC ACID, PLASMA  LACTIC ACID, PLASMA  AMMONIA  HEPATITIS B SURFACE ANTIGEN  HEPATITIS B SURFACE ANTIBODY, QUANTITATIVE  PREPARE RBC (CROSSMATCH)  TYPE AND SCREEN                                                                                                                          Radiology CT Head Wo Contrast  Result Date: 08/26/2022 CLINICAL DATA:  Delirium. EXAM: CT HEAD WITHOUT CONTRAST TECHNIQUE: Contiguous axial images were obtained from the base of the skull through the vertex without intravenous contrast. RADIATION DOSE REDUCTION: This exam was performed according to the departmental dose-optimization program which includes  automated exposure control, adjustment of the mA and/or kV according to patient size and/or use of iterative reconstruction technique. COMPARISON:  08/06/2022 FINDINGS: Brain: There is no evidence for acute hemorrhage, hydrocephalus, mass lesion, or abnormal extra-axial fluid collection. No definite CT evidence for acute infarction. Patchy low attenuation in the deep hemispheric and periventricular white matter is nonspecific, but likely reflects chronic microvascular ischemic demyelination. Vascular: No hyperdense vessel or unexpected calcification. Skull: No evidence for fracture. No worrisome lytic or sclerotic lesion. Sinuses/Orbits: The visualized paranasal sinuses and mastoid air cells are clear. Visualized portions of the globes and intraorbital fat are unremarkable. Other: None. IMPRESSION: 1. No acute intracranial abnormality. 2. Chronic small vessel ischemic disease. Electronically Signed   By: Misty Stanley M.D.   On: 08/26/2022 14:35   DG Chest Portable 1 View  Result Date: 08/26/2022 CLINICAL DATA:  Dyspnea. EXAM: PORTABLE CHEST 1 VIEW COMPARISON:  08/06/2022 FINDINGS: There is a left chest wall dialysis catheter with tips in the right atrium. Previous median sternotomy and CABG procedure. No pleural fluid or airspace disease. Pulmonary vascular congestion noted. IMPRESSION: Pulmonary vascular congestion. Electronically Signed   By: Kerby Moors M.D.   On: 08/26/2022 14:21    Pertinent labs & imaging results that were available during my care of the patient were reviewed by me and considered in my medical decision making (see MDM for details).  Medications Ordered in ED Medications  0.9 %  sodium chloride infusion (Manually program via Guardrails IV Fluids) (has no administration in time range)  Chlorhexidine Gluconate Cloth 2 % PADS 6 each (  has no administration in time range)  metoprolol tartrate (LOPRESSOR) injection 5 mg (has no administration in time range)  lactated ringers bolus  500 mL (0 mLs Intravenous Stopped 08/26/22 1521)                                                                                                                                     Procedures .Critical Care  Performed by: Teressa Lower, MD Authorized by: Teressa Lower, MD   Critical care provider statement:    Critical care time (minutes):  30   Critical care was necessary to treat or prevent imminent or life-threatening deterioration of the following conditions: Anemia requiring blood transfusion.   Critical care was time spent personally by me on the following activities:  Development of treatment plan with patient or surrogate, discussions with consultants, evaluation of patient's response to treatment, examination of patient, ordering and review of laboratory studies, ordering and review of radiographic studies, ordering and performing treatments and interventions, pulse oximetry, re-evaluation of patient's condition and review of old charts Ultrasound ED Peripheral IV (Provider)  Date/Time: 08/26/2022 6:59 PM  Performed by: Teressa Lower, MD Authorized by: Teressa Lower, MD   Procedure details:    Indications: multiple failed IV attempts     Skin Prep: isopropyl alcohol     Location:  Right AC   Angiocath:  20 G   Bedside Ultrasound Guided: Yes     Images: not archived     Patient tolerated procedure without complications: Yes     Dressing applied: Yes     (including critical care time)  Medical Decision Making / ED Course   This patient presents to the ED for concern of altered mental status, tachycardia, this involves an extensive number of treatment options, and is a complaint that carries with it a high risk of complications and morbidity.  The differential diagnosis includes hypoperfusion during dialysis, anemia, electrolyte abnormality, sepsis, metabolic encephalopathy, toxic encephalopathy  MDM: Patient seen in the emergency room for evaluation of tachycardia  and syncope during dialysis with altered mental status.  Physical exam reveals a disoriented patient who will respond to noxious stimuli only, skin pallor and irregular tachycardia.  Laboratory evaluation with an initial high-sensitivity troponin of 152 but delta is flat at 152.  CBC concerning with a hemoglobin of 6.6 with an MCV of 115.4.  2 units packed red blood cells ordered.  Hypokalemia to 3.3, BNP elevated to 1590.  Chest x-ray with pulmonary edema.  CT head unremarkable.  TSH slightly elevated at 4.75.  Patient received 500 cc of lactated Ringer's and on my reevaluation, symptoms have improved and tachycardia has improved.  Patient ultimately require hospitalization for symptomatic anemia.  Suspect hypovolemia during dialysis today causing syncopal event.   Additional history obtained: -Additional history obtained from husband -External records from outside source obtained and reviewed including: Chart review including previous notes, labs, imaging, consultation notes  Lab Tests: -I ordered, reviewed, and interpreted labs.   The pertinent results include:   Labs Reviewed  COMPREHENSIVE METABOLIC PANEL - Abnormal; Notable for the following components:      Result Value   Potassium 3.3 (*)    Glucose, Bld 105 (*)    Creatinine, Ser 3.20 (*)    Calcium 8.3 (*)    Total Protein 6.2 (*)    Albumin 3.1 (*)    GFR, Estimated 14 (*)    All other components within normal limits  CBC WITH DIFFERENTIAL/PLATELET - Abnormal; Notable for the following components:   RBC 1.82 (*)    Hemoglobin 6.6 (*)    HCT 21.0 (*)    MCV 115.4 (*)    MCH 36.3 (*)    RDW 21.9 (*)    All other components within normal limits  BRAIN NATRIURETIC PEPTIDE - Abnormal; Notable for the following components:   B Natriuretic Peptide 1,590.0 (*)    All other components within normal limits  TSH - Abnormal; Notable for the following components:   TSH 4.745 (*)    All other components within normal limits  POC  OCCULT BLOOD, ED - Abnormal  TROPONIN I (HIGH SENSITIVITY) - Abnormal; Notable for the following components:   Troponin I (High Sensitivity) 152 (*)    All other components within normal limits  TROPONIN I (HIGH SENSITIVITY) - Abnormal; Notable for the following components:   Troponin I (High Sensitivity) 152 (*)    All other components within normal limits  LACTIC ACID, PLASMA  LACTIC ACID, PLASMA  AMMONIA  HEPATITIS B SURFACE ANTIGEN  HEPATITIS B SURFACE ANTIBODY, QUANTITATIVE  PREPARE RBC (CROSSMATCH)  TYPE AND SCREEN      EKG   EKG Interpretation  Date/Time:  Friday August 26 2022 13:41:15 EST Ventricular Rate:  122 PR Interval:  77 QRS Duration: 145 QT Interval:  401 QTC Calculation: 572 R Axis:   118 Text Interpretation: Sinus or ectopic atrial tachycardia Consider left ventricular hypertrophy Repol abnrm suggests ischemia, lateral leads Prolonged QT interval Confirmed by Milton Ferguson [phone]) on 08/26/2022 4:02:26 PM         Imaging Studies ordered: I ordered imaging studies including chest x-ray, CT head I independently visualized and interpreted imaging. I agree with the radiologist interpretation   Medicines ordered and prescription drug management: Meds ordered this encounter  Medications   lactated ringers bolus 500 mL   0.9 %  sodium chloride infusion (Manually program via Guardrails IV Fluids)   Chlorhexidine Gluconate Cloth 2 % PADS 6 each   DISCONTD: metoprolol tartrate (LOPRESSOR) tablet 25 mg   metoprolol tartrate (LOPRESSOR) injection 5 mg    -I have reviewed the patients home medicines and have made adjustments as needed  Critical interventions Packed red blood cell administration   Cardiac Monitoring: The patient was maintained on a cardiac monitor.  I personally viewed and interpreted the cardiac monitored which showed an underlying rhythm of: Sinus tachycardia, NSR  Social Determinants of Health:  Factors impacting patients care  include: none   Reevaluation: After the interventions noted above, I reevaluated the patient and found that they have :improved  Co morbidities that complicate the patient evaluation  Past Medical History:  Diagnosis Date   Acute on chronic respiratory failure with hypoxia ([place]) 10/10/2016   Anxiety    Arthritis    AVM (arteriovenous malformation) of colon    CAD (coronary artery disease)    a. s/p CABG in 2013 b. DES to D1  in 10/2016. c. cath in 07/2018 showing patent grafts with occlusion of D1 at prior stent site and progression of PDA disease --> medical management recommended   Carotid artery disease ([place])    a. [id number] LICA, [id number]    Chronic bronchitis (H[facility])    Chronic HFrEF (heart failure with reduced ejection fraction) ([place])    Colon cancer ([place]) 1992   Esophageal stricture    ESRD on hemodialysis ([place])    ESRD due to HTN, started dialysis 2011 and gets HD at [facility] with Dr Hinda Lenis on MWF schedule.  Access is LUA AVF as of Sept 2014.    GERD (gastroesophageal reflux disease)    High cholesterol 12/2011   History of blood transfusion 07/2011; 12/2011; 01/2012 X 2; 04/2012   History of gout    History of lower GI bleeding    Hypertension    Iron deficiency anemia    Jugular vein occlusion, right (HCC)    Mitral regurgitation    a. Moderate by echo, 02/2012   Mitral valve disease    NSVT (nonsustained ventricular tachycardia) (HCC)    Ovarian cancer ([place]) 1992   PAF (paroxysmal atrial fibrillation) ([place])    Pneumonia ~ 2009   PUD (peptic ulcer disease)    TIA (transient ischemic attack)    Tricuspid valve disease       Dispostion: I considered admission for this patient, and patient will ultimately require hospital admission for symptomatic anemia.     Final Clinical Impression(s) / ED Diagnoses Final diagnoses:  Iron deficiency anemia due to chronic blood loss     @PCDICTATION$ @    Aliani Caccavale, Debe Coder, MD 08/26/22 1859

## 2022-08-26 NOTE — ED Triage Notes (Addendum)
Pt in via EMS from [facility], pt reported to have received all but an hour of treatment, pt reported to have tachycardia with HR 120s-130s, pt baseline disoriented non specified by EMS, pt oriented to person, pt disoriented to time and location, pt requires multiple verbal stimuli attempts for her to respond, pt HR 120 in ED, pt at [facility] in [place]

## 2022-08-26 NOTE — Progress Notes (Signed)
Date and time results received: 08/26/22 1601 (use smartphrase ".now" to insert current time)  Test: Troponin Critical Value: 152  Name of Provider Notified: MD Zammit  Orders Received? Or Actions Taken?:  No new orders received at this time

## 2022-08-26 NOTE — Progress Notes (Signed)
   08/26/22 1750  Vitals  Temp 97.6 F (36.4 C)  Temp Source Oral  BP (!) 149/84  MAP (mmHg) 99  BP Method Automatic  Pulse Rate (!) 117  Pulse Rate Source Monitor  MEWS COLOR  MEWS Score Color Yellow  Oxygen Therapy  SpO2 100 %  MEWS Score  MEWS Temp 0  MEWS Systolic 0  MEWS Pulse 2  MEWS RR 0  MEWS LOC 1  MEWS Score 3

## 2022-08-26 NOTE — Progress Notes (Signed)
Attempted to assess patient's pupils. Patient clenching her eyes and her mouth and pulling her head away from me. Patient's husband is at the bedside, states she is more confused then usual. He states sometimes she can walk, other times she is too weak. Patient is not responding verbally, only moving in response. Patient's heels are slightly boggy, but no open areas.

## 2022-08-26 NOTE — ED Notes (Signed)
Kommor, MD notified re: troponin 152 & hemoglobin 6.6

## 2022-08-26 NOTE — Progress Notes (Signed)
Patient transferred from ED, she does nod her head, however unable to answer questions at this time. MEWS yellow due to elevated pulse , hx of a. Fib, Dr. Lucrezia Europe aware, Her blood is not ready at this time d/t it has antibodies and will be sent at a later time,    08/26/22 1750  Vitals  Temp 97.6 F (36.4 C)  Temp Source Oral  BP (!) 149/84  MAP (mmHg) 99  BP Method Automatic  Pulse Rate (!) 117  Pulse Rate Source Monitor  Resp 20  Level of Consciousness  Level of Consciousness Responds to Voice  MEWS COLOR  MEWS Score Color Yellow  Oxygen Therapy  SpO2 100 %  O2 Device Room Air  MEWS Score  MEWS Temp 0  MEWS Systolic 0  MEWS Pulse 2  MEWS RR 0  MEWS LOC 1  MEWS Score 3

## 2022-08-26 NOTE — ED Notes (Signed)
Husband at bedside.  

## 2022-08-26 NOTE — H&P (Signed)
TRH H&P   Patient Demographics:    Shawna [name], is a 82 y.o. female  MRN: [id number]   DOB - [birth year]  Admit Date - 08/26/2022  Outpatient Primary MD for the patient is [facility], [facility]  Referring MD/NP/PA: Dr. Roderic Palau  Outpatient Specialists: HD in  [place]  Patient coming from: HD center  Chief Complaint  Patient presents with   Tachycardia      HPI:    Shawna [name]  is a 82 y.o. female,with medical history significant of ESRD on MWF HD, CAD s/p CABG, chronic systolic CHF, HLD, HTN, and afib on anticoagulation, patient patient with recent hospitalizationr for GI bleed  s/p EGD and colonoscopy 08/10/2022, duodenal and colonic polyps which were removed. 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 -Patient was sent from HD center for tachycardia, patient had 1 hour left during HD, she was noted to be tachycardic, unclear A-fib or sinus tachycardia, so she was sent to ED for further evaluation, patient is altered and confused, husband reports she had been infused since his recent discharge on 08/17/2022, otherwise no significant complaints, he reports last time she had a BM yesterday, but he is unaware if she had any melena, he denies ever having any nausea, coffee-ground emesis. -In ED she received 500 cc fluid bolus, no further tachycardia, her hemoglobin was noted to be 6.6, goal still pending, chest x-ray with evidence of vascular congestion, was ordered units PRBC transfusion and triage hospitalist consulted to admit.    Review of systems:    Is confused, unable to provide appropriate review of system   With Past History of the following :    Past Medical History:  Diagnosis Date   Acute on chronic respiratory failure with hypoxia ([place]) 10/10/2016   Anxiety    Arthritis    AVM (arteriovenous malformation) of colon    CAD (coronary artery disease)    a. s/p CABG in 2013 b. DES to D1  in 10/2016. c. cath in 07/2018 showing patent grafts with occlusion of D1 at prior stent site and progression of PDA disease --> medical management recommended   Carotid artery disease ([place])    a. [id number] LICA, [id number]    Chronic bronchitis (HCC)    Chronic HFrEF (heart failure with reduced ejection fraction) ([place])    Colon cancer ([place]) 1992   Esophageal stricture    ESRD on hemodialysis ([place])    ESRD due to HTN, started dialysis 2011 and gets HD at [facility] with Dr Hinda Lenis on MWF schedule.  Access is LUA AVF as of Sept 2014.    GERD (gastroesophageal reflux disease)    High cholesterol 12/2011   History of blood transfusion 07/2011; 12/2011; 01/2012 X 2; 04/2012   History of gout    History of lower GI bleeding    Hypertension    Iron deficiency anemia    Jugular vein occlusion, right ([place])  Mitral regurgitation    a. Moderate by echo, 02/2012   Mitral valve disease    NSVT (nonsustained ventricular tachycardia) ([place])    Ovarian cancer ([place]) 1992   PAF (paroxysmal atrial fibrillation) ([place])    Pneumonia ~ 2009   PUD (peptic ulcer disease)    TIA (transient ischemic attack)    Tricuspid valve disease       Past Surgical History:  Procedure Laterality Date   A/V SHUNTOGRAM Left 03/19/2019   Procedure: A/V SHUNTOGRAM;  Surgeon: Katha Cabal, MD;  Location: [place] [facility];  Service: Cardiovascular;  Laterality: Left;   ABDOMINAL HYSTERECTOMY  1992   APPENDECTOMY  06/1990   AV FISTULA PLACEMENT  07/2009   left upper arm   AV FISTULA PLACEMENT Right 09/06/2016   Procedure: RIGHT FOREARM ARTERIOVENOUS (AV) GRAFT;  Surgeon: Elam Dutch, MD;  Location: [place];  Service: Vascular;  Laterality: Right;   AV FISTULA PLACEMENT N/A 02/24/2017   Procedure: INSERTION OF ARTERIOVENOUS (AV) GORE-TEX GRAFT ARM (BRACHIAL AXILLARY);  Surgeon: Katha Cabal, MD;  Location: [facility];  Service: Vascular;  Laterality: N/A;   [place] Right 09/06/2016   Procedure: REMOVAL OF Right  Arm ARTERIOVENOUS GORETEX GRAFT and Vein Patch angioplasty of brachial artery;  Surgeon: Angelia Mould, MD;  Location: [place];  Service: Vascular;  Laterality: Right;   BIOPSY  09/26/2019   Procedure: BIOPSY;  Surgeon: Rogene Houston, MD;  [facility];  Service: Endoscopy;;   COLON RESECTION  1992   COLON SURGERY     COLONOSCOPY N/A 03/09/2019   Procedure: COLONOSCOPY;  Surgeon: Rogene Houston, MD;  [facility];  Service: Endoscopy;  Laterality: N/A;   COLONOSCOPY N/A 08/11/2022   Procedure: COLONOSCOPY;  Surgeon: Jerene Bears, MD;  Location: [place];  Service: Gastroenterology;  Laterality: N/A;   COLONOSCOPY WITH PROPOFOL N/A 06/08/2021   Procedure: COLONOSCOPY WITH PROPOFOL;  Surgeon: Harvel Quale, MD;  [facility];  Service: Gastroenterology;  Laterality: N/A;  9:05 /Patient is on dialysis Mon Wed Fri   CORONARY ANGIOPLASTY WITH STENT PLACEMENT  12/15/11   "2"   CORONARY ANGIOPLASTY WITH STENT PLACEMENT  y/2013   "1; makes total of 3" (05/02/2012)   CORONARY ARTERY BYPASS GRAFT  06/13/2012   Procedure: CORONARY ARTERY BYPASS GRAFTING (CABG);  Surgeon: Grace Isaac, MD;  Location: [place];  Service: Open Heart Surgery;  Laterality: N/A;  cabg x four;  using left internal mammary artery, and left leg greater saphenous vein harvested endoscopically   CORONARY STENT INTERVENTION N/A 10/13/2016   Procedure: Coronary Stent Intervention;  Surgeon: [place] Sine, MD;  Location: [place] [facility];  Service: Cardiovascular;  Laterality: N/A;   DIALYSIS/PERMA CATHETER REMOVAL N/A 04/18/2017   Procedure: DIALYSIS/PERMA CATHETER REMOVAL;  Surgeon: Katha Cabal, MD;  Location: [place] [facility];  Service: Cardiovascular;  Laterality: N/A;   DILATION AND CURETTAGE OF UTERUS     ENTEROSCOPY N/A 06/08/2021   Procedure: PUSH ENTEROSCOPY;  Surgeon: Harvel Quale, MD;  [facility];  Service: Gastroenterology;   Laterality: N/A;   ESOPHAGOGASTRODUODENOSCOPY  01/20/2012   Procedure: ESOPHAGOGASTRODUODENOSCOPY (EGD);  Surgeon: Ladene Artist, MD,FACG;  Location: [facility] ENDOSCOPY;  Service: Endoscopy;  Laterality: N/A;   ESOPHAGOGASTRODUODENOSCOPY N/A 03/26/2013   Procedure: ESOPHAGOGASTRODUODENOSCOPY (EGD);  Surgeon: Irene Shipper, MD;  Location: [facility] ENDOSCOPY;  Service: Endoscopy;  Laterality: N/A;   ESOPHAGOGASTRODUODENOSCOPY N/A 04/30/2015   Procedure: ESOPHAGOGASTRODUODENOSCOPY (EGD);  Surgeon: Rogene Houston, MD;  [facility];  Service: Endoscopy;  Laterality: N/A;  1pm - moved to 10/20 @ 1:10   ESOPHAGOGASTRODUODENOSCOPY N/A 07/29/2016   Procedure: ESOPHAGOGASTRODUODENOSCOPY (EGD);  Surgeon: Manus Gunning, MD;  Location: [place];  Service: Gastroenterology;  Laterality: N/A;  enteroscopy   ESOPHAGOGASTRODUODENOSCOPY N/A 09/26/2019   Procedure: ESOPHAGOGASTRODUODENOSCOPY (EGD);  Surgeon: Rogene Houston, MD;  [facility];  Service: Endoscopy;  Laterality: N/A;  1250   ESOPHAGOGASTRODUODENOSCOPY N/A 08/11/2022   Procedure: ESOPHAGOGASTRODUODENOSCOPY (EGD);  Surgeon: Jerene Bears, MD;  Location: [facility] ENDOSCOPY;  Service: Gastroenterology;  Laterality: N/A;   ESOPHAGOGASTRODUODENOSCOPY (EGD) WITH PROPOFOL N/A 02/05/2021   Procedure: ESOPHAGOGASTRODUODENOSCOPY (EGD) WITH PROPOFOL;  Surgeon: Eloise Harman, DO;  [facility];  Service: Endoscopy;  Laterality: N/A;   GIVENS CAPSULE STUDY N/A 03/07/2019   Procedure: GIVENS CAPSULE STUDY;  Surgeon: Rogene Houston, MD;  [facility];  Service: Endoscopy;  Laterality: N/A;  7:30   GIVENS CAPSULE STUDY N/A 04/22/2021   Procedure: GIVENS CAPSULE STUDY;  Surgeon: Rogene Houston, MD;  [facility];  Service: Endoscopy;  Laterality: N/A;  7:30   HOT HEMOSTASIS N/A 08/11/2022   Procedure: HOT HEMOSTASIS (ARGON PLASMA COAGULATION/BICAP);  Surgeon: Jerene Bears, MD;  Location: [facility] ENDOSCOPY;  Service:  Gastroenterology;  Laterality: N/A;   INSERTION OF DIALYSIS CATHETER N/A 10/05/2020   Procedure: ABORTED TUNNELED DIALYSIS CATHETER PLACEMENT RIGHT INTERNAL JUGULAR VEIN ;  Surgeon: Virl Cagey, MD;  [facility];  Service: General;  Laterality: N/A;   INTRAOPERATIVE TRANSESOPHAGEAL ECHOCARDIOGRAM  06/13/2012   Procedure: INTRAOPERATIVE TRANSESOPHAGEAL ECHOCARDIOGRAM;  Surgeon: Grace Isaac, MD;  Location: [place];  Service: Open Heart Surgery;  Laterality: N/A;   IR DIALY SHUNT INTRO NEEDLE/INTRACATH INITIAL W/IMG LEFT Left 10/06/2020   IR FLUORO GUIDE CV LINE RIGHT  06/17/2020   IR GENERIC HISTORICAL  07/26/2016   IR FLUORO GUIDE CV LINE RIGHT 07/26/2016 Greggory Keen, MD [facility]   IR GENERIC HISTORICAL  07/26/2016   IR US GUIDE VASC ACCESS RIGHT 07/26/2016 Greggory Keen, MD [facility]   IR GENERIC HISTORICAL  08/02/2016   IR US GUIDE VASC ACCESS RIGHT 08/02/2016 Greggory Keen, MD [facility]   IR GENERIC HISTORICAL  08/02/2016   IR FLUORO GUIDE CV LINE RIGHT 08/02/2016 Greggory Keen, MD [facility]   IR RADIOLOGY PERIPHERAL GUIDED IV START  03/28/2017   IR REMOVAL TUN CV CATH W/O FL  08/11/2020   IR THROMBECTOMY AV FISTULA W/THROMBOLYSIS INC/SHUNT/IMG LEFT Left 06/17/2020   IR US GUIDE VASC ACCESS LEFT  06/17/2020   IR US GUIDE VASC ACCESS RIGHT  03/28/2017   IR US GUIDE VASC ACCESS RIGHT  06/17/2020   LEFT HEART CATH AND CORONARY ANGIOGRAPHY N/A 09/20/2016   Procedure: Left Heart Cath and Coronary Angiography;  Surgeon: [name], MD;  Location: [place] [facility];  Service: Cardiovascular;  Laterality: N/A;   LEFT HEART CATH AND CORS/GRAFTS ANGIOGRAPHY N/A 10/13/2016   Procedure: Left Heart Cath and Cors/Grafts Angiography;  Surgeon: [place] Sine, MD;  Location: [place] [facility];  Service: Cardiovascular;  Laterality: N/A;   LEFT HEART CATH AND CORS/GRAFTS ANGIOGRAPHY N/A 07/13/2018   Procedure: LEFT HEART CATH AND CORS/GRAFTS ANGIOGRAPHY;  Surgeon: Martinique, Peter M, MD;   Location: [place] [facility];  Service: Cardiovascular;  Laterality: N/A;   LEFT HEART CATH AND CORS/GRAFTS ANGIOGRAPHY N/A 07/22/2021   Procedure: LEFT HEART CATH AND CORS/GRAFTS  ANGIOGRAPHY;  Surgeon: Lorretta Harp, MD;  Location: [place] [facility];  Service: Cardiovascular;  Laterality: N/A;   LEFT HEART CATHETERIZATION WITH CORONARY ANGIOGRAM N/A 12/15/2011   Procedure: LEFT HEART CATHETERIZATION WITH CORONARY ANGIOGRAM;  Surgeon: Burnell Blanks, MD;  Location: [facility] CATH LAB;  Service: Cardiovascular;  Laterality: N/A;   LEFT HEART CATHETERIZATION WITH CORONARY ANGIOGRAM N/A 01/10/2012   Procedure: LEFT HEART CATHETERIZATION WITH CORONARY ANGIOGRAM;  Surgeon: Peter M Martinique, MD;  Location: [facility] CATH LAB;  Service: Cardiovascular;  Laterality: N/A;   LEFT HEART CATHETERIZATION WITH CORONARY ANGIOGRAM N/A 06/08/2012   Procedure: LEFT HEART CATHETERIZATION WITH CORONARY ANGIOGRAM;  Surgeon: Burnell Blanks, MD;  Location: [facility] CATH LAB;  Service: Cardiovascular;  Laterality: N/A;   LEFT HEART CATHETERIZATION WITH CORONARY/GRAFT ANGIOGRAM N/A 12/10/2013   Procedure: LEFT HEART CATHETERIZATION WITH Beatrix Fetters;  Surgeon: [name], MD;  Location: [facility] CATH LAB;  Service: Cardiovascular;  Laterality: N/A;   OVARY SURGERY     ovarian cancer   POLYPECTOMY  03/09/2019   Procedure: POLYPECTOMY;  Surgeon: Rogene Houston, MD;  [facility];  Service: Endoscopy;;  cecal    POLYPECTOMY N/A 09/26/2019   Procedure: DUODENAL POLYPECTOMY;  Surgeon: Rogene Houston, MD;  [facility];  Service: Endoscopy;  Laterality: N/A;   POLYPECTOMY  08/11/2022   Procedure: POLYPECTOMY;  Surgeon: Jerene Bears, MD;  Location: [place];  Service: Gastroenterology;;   REVISION OF ARTERIOVENOUS GORETEX GRAFT N/A 02/24/2017   Procedure: REVISION OF ARTERIOVENOUS GORETEX GRAFT (RESECTION);  Surgeon: Katha Cabal, MD;  Location: [facility];  Service: Vascular;  Laterality:  N/A;   REVISON OF ARTERIOVENOUS FISTULA Left 06/19/2020   Procedure: REVISION OF LEFT UPPER ARM AV GRAFT WITH INTERPOSITION JUMP GRAFT USING 6MM GORE LIMB;  Surgeon: Marty Heck, MD;  Location: [place];  Service: Vascular;  Laterality: Left;   SHUNTOGRAM N/A 10/15/2013   Procedure: Fistulogram;  Surgeon: Serafina Mitchell, MD;  Location: [facility] CATH LAB;  Service: Cardiovascular;  Laterality: N/A;   THROMBECTOMY / ARTERIOVENOUS GRAFT REVISION  2011   left upper arm   TUBAL LIGATION  1980's   UPPER EXTREMITY ANGIOGRAPHY Bilateral 12/06/2016   Procedure: Upper Extremity Angiography;  Surgeon: Katha Cabal, MD;  Location: [place] [facility];  Service: Cardiovascular;  Laterality: Bilateral;   UPPER EXTREMITY INTERVENTION Left 06/06/2017   Procedure: UPPER EXTREMITY INTERVENTION;  Surgeon: Katha Cabal, MD;  Location: [place] [facility];  Service: Cardiovascular;  Laterality: Left;      Social History:     Social History   Tobacco Use   Smoking status: Never   Smokeless tobacco: Never  Substance Use Topics   Alcohol use: No    Alcohol/week: 0.0 standard drinks of alcohol        Family History  Problem Relation Age of Onset   Heart disease Mother        Heart Disease before age 48   Hyperlipidemia Mother    Hypertension Mother    Diabetes Mother    Heart attack Mother    Heart disease Father        Heart Disease before age 87   Hyperlipidemia Father    Hypertension Father    Diabetes Father    Diabetes Sister    Hypertension Sister    Diabetes Brother    Hyperlipidemia Brother    Heart attack Brother    Hypertension Sister    Heart attack Brother  Colon cancer Child 2   Other Other        noncontributory for early CAD   Esophageal cancer Neg Hx    Liver disease Neg Hx    Kidney disease Neg Hx    Colon polyps Neg Hx       Home Medications:   Prior to Admission medications   Medication Sig Start Date End Date Taking? Authorizing Provider   ALPRAZolam Duanne Moron) 0.5 MG tablet Take 1 tablet (0.5 mg total) by mouth daily as needed for anxiety. 08/17/22   Donne Hazel, MD  amiodarone (PACERONE) 100 MG tablet Take 1 tablet (100 mg total) by mouth daily. 04/27/22   Nita Sells, MD  Aspirin 81 MG CAPS Take 1 capsule by mouth daily at 12 noon.    [provider]  benzonatate (TESSALON) 100 MG capsule Take 100 mg by mouth 3 (three) times daily as needed for cough. 05/10/22   [provider]  folic acid (FOLVITE) 1 MG tablet Take 1 mg by mouth daily. 05/24/22   [provider]  hydrOXYzine (ATARAX) 25 MG tablet Take 25 mg by mouth every 6 (six) hours as needed for itching. 06/06/22   [provider]  levothyroxine (SYNTHROID) 75 MCG tablet Take 1 tablet (75 mcg total) by mouth daily before breakfast. 08/14/22 09/13/22  Darliss Cheney, MD  lisinopril (ZESTRIL) 20 MG tablet Take 1 tablet (20 mg total) by mouth daily. 08/18/22 09/17/22  Donne Hazel, MD  metoprolol tartrate (LOPRESSOR) 25 MG tablet Take 0.5 tablets (12.5 mg total) by mouth 2 (two) times daily. 03/04/22   Arnoldo Lenis, MD  multivitamin (RENA-VIT) TABS tablet Take 1 tablet by mouth daily.    [provider]  nitroGLYCERIN (NITROSTAT) 0.4 MG SL tablet Place 0.4 mg under the tongue every 5 (five) minutes as needed for chest pain.    [provider]  omeprazole (PRILOSEC) 20 MG capsule Take 2 capsules (40 mg total) by mouth daily as needed (acid reflux). Patient taking differently: Take 20 mg by mouth daily. 06/12/22   Aline August, MD  ondansetron (ZOFRAN-ODT) 4 MG disintegrating tablet Take 4 mg by mouth every 8 (eight) hours as needed for nausea or vomiting. 05/05/22   [provider]  rosuvastatin (CRESTOR) 10 MG tablet Take 1 tablet by mouth once daily 06/06/22   Ahmed Prima, [place] M, PA-C  sevelamer carbonate (RENVELA) 800 MG tablet Take 3,200 mg by mouth in the morning and at bedtime. 12/27/21   [provider]     Allergies:     Allergies  Allergen Reactions   Amlodipine Swelling   Aspirin Other (See Comments)    High Doses Mess up her stomach; "makes my bowels have blood in them". Takes 81 mg EC Aspirin    Nitrofurantoin Hives   Bactrim [Sulfamethoxazole-Trimethoprim] Rash   Contrast Media [Iodinated Contrast Media] Itching   Iron Itching and Other (See Comments)    "they gave me iron in dialysis; had to give me Benadryl cause I had to have the iron" (05/02/2012)   Tylenol [Acetaminophen] Itching and Other (See Comments)    Makes her feet on fire per pt - can tolerate per pt   Gabapentin Other (See Comments)    Unknown reaction   Iron Sucrose Other (See Comments)    Unknown   Ranexa [Ranolazine] Other (See Comments)    Myoclonus-hospitalized    Sucroferric Oxyhydroxide Other (See Comments)    Unknown   Dexilant [Dexlansoprazole] Other (See Comments)  Upset stomach   Hydralazine Itching    Has tolerated while inpatient Has tolerated while inpatient   Levaquin [Levofloxacin In D5w] Rash   Morphine And Related Itching and Other (See Comments)    Itching in feet   Pantoprazole Rash   Plavix [Clopidogrel Bisulfate] Rash   Protonix [Pantoprazole Sodium] Rash   Venofer [Ferric Oxide] Itching and Other (See Comments)    Patient reports using Benadryl prior to doses as [place]     Physical Exam:   Vitals  Blood pressure (!) 158/63, pulse 75, temperature 99 F (37.2 C), temperature source Oral, resp. rate (!) 22, SpO2 100 %.   1. General elderly female, laying in bed, no apparent distress  2.  Somnolent, confused, unable to answer any questions appropriately  3. No F.N deficits, ALL C.Nerves Intact, Strength 5/5 all 4 extremities, Sensation intact all 4 extremities, Plantars down going.  4. Ears and Eyes appear Normal, Conjunctivae clear, Moist Oral Mucosa.  5. Supple Neck, positive JVD, No cervical lymphadenopathy appriciated, No Carotid  Bruits.  6. Symmetrical Chest wall movement, Minister entry at the bases  7.  Irregular irregular, no Gallops, Rubs or Murmurs, No Parasternal Heave.  8. Positive Bowel Sounds, Abdomen Soft, No tenderness, No organomegaly appriciated,No rebound -guarding or rigidity.  9.  No Cyanosis, Normal Skin Turgor, No Skin Rash or Bruise.  10. Good muscle tone,  joints appear normal , no effusions, Normal ROM.     Data Review:    CBC Recent Labs  Lab 08/26/22 1410  WBC 6.6  HGB 6.6*  HCT 21.0*  PLT 306  MCV 115.4*  MCH 36.3*  MCHC 31.4  RDW 21.9*  LYMPHSABS 0.9  MONOABS 0.9  EOSABS 0.2  BASOSABS 0.1   ------------------------------------------------------------------------------------------------------------------  Chemistries  Recent Labs  Lab 08/26/22 1410  NA 135  K 3.3*  CL 99  CO2 27  GLUCOSE 105*  BUN 14  CREATININE 3.20*  CALCIUM 8.3*  AST 26  ALT 17  ALKPHOS 85  BILITOT 0.5   ------------------------------------------------------------------------------------------------------------------ estimated creatinine clearance is 10.7 mL/min (A) (by C-G formula based on SCr of 3.2 mg/dL (H)). ------------------------------------------------------------------------------------------------------------------ Recent Labs    08/26/22 1410  TSH 4.745*    Coagulation profile No results for input(s): "INR", "PROTIME" in the last 168 hours. ------------------------------------------------------------------------------------------------------------------- No results for input(s): "DDIMER" in the last 72 hours. -------------------------------------------------------------------------------------------------------------------  Cardiac Enzymes No results for input(s): "CKMB", "TROPONINI", "MYOGLOBIN" in the last 168 hours.  Invalid input(s): "CK" ------------------------------------------------------------------------------------------------------------------     Component Value Date/Time   BNP 1,590.0 (H) 08/26/2022 1410     ---------------------------------------------------------------------------------------------------------------  Urinalysis    Component Value Date/Time   COLORURINE YELLOW 12/16/2012 1919   APPEARANCEUR CLOUDY (A) 12/16/2012 1919   LABSPEC 1.009 12/16/2012 1919   PHURINE 7.5 12/16/2012 1919   GLUCOSEU NEGATIVE 12/16/2012 1919   HGBUR TRACE (A) 12/16/2012 1919   BILIRUBINUR NEGATIVE 12/16/2012 1919   KETONESUR NEGATIVE 12/16/2012 1919   PROTEINUR 100 (A) 12/16/2012 1919   UROBILINOGEN 0.2 12/16/2012 1919   NITRITE NEGATIVE 12/16/2012 1919   LEUKOCYTESUR SMALL (A) 12/16/2012 1919    ----------------------------------------------------------------------------------------------------------------   Imaging Results:    CT Head Wo Contrast  Result Date: 08/26/2022 CLINICAL DATA:  Delirium. EXAM: CT HEAD WITHOUT CONTRAST TECHNIQUE: Contiguous axial images were obtained from the base of the skull through the vertex without intravenous contrast. RADIATION DOSE REDUCTION: This exam was performed according to the departmental dose-optimization program which includes automated exposure control, adjustment of the mA and/or kV  according to patient size and/or use of iterative reconstruction technique. COMPARISON:  08/06/2022 FINDINGS: Brain: There is no evidence for acute hemorrhage, hydrocephalus, mass lesion, or abnormal extra-axial fluid collection. No definite CT evidence for acute infarction. Patchy low attenuation in the deep hemispheric and periventricular white matter is nonspecific, but likely reflects chronic microvascular ischemic demyelination. Vascular: No hyperdense vessel or unexpected calcification. Skull: No evidence for fracture. No worrisome lytic or sclerotic lesion. Sinuses/Orbits: The visualized paranasal sinuses and mastoid air cells are clear. Visualized portions of the globes and intraorbital fat are  unremarkable. Other: None. IMPRESSION: 1. No acute intracranial abnormality. 2. Chronic small vessel ischemic disease. Electronically Signed   By: Misty Stanley M.D.   On: 08/26/2022 14:35   DG Chest Portable 1 View  Result Date: 08/26/2022 CLINICAL DATA:  Dyspnea. EXAM: PORTABLE CHEST 1 VIEW COMPARISON:  08/06/2022 FINDINGS: There is a left chest wall dialysis catheter with tips in the right atrium. Previous median sternotomy and CABG procedure. No pleural fluid or airspace disease. Pulmonary vascular congestion noted. IMPRESSION: Pulmonary vascular congestion. Electronically Signed   By: Kerby Moors M.D.   On: 08/26/2022 14:21     EKG: Vent. rate 83 BPM PR interval * ms QRS duration 146 ms QT/QTcB 447/526 ms P-R-T axes * 117 -39 Atrial flutter with predominant 3:1 AV block Consider left ventricular hypertrophy Repol abnrm, probable ischemia, lateral leads  Assessment & Plan:    Principal Problem:   Acute on chronic blood loss anemia Active Problems:   Atherosclerotic heart disease of native coronary artery without angina pectoris   AVM (arteriovenous malformation) of small bowel, acquired   Chronic combined systolic and diastolic CHF (congestive heart failure) (HCC)   ESRD (end stage renal disease) on dialysis (HCC)   PAF (paroxysmal atrial fibrillation) (HCC)   Hypothyroidism (acquired)   Anemia associated with chronic renal failure   Delirium   Angiodysplasia of duodenum  Acute on chronic blood loss anemia Symptomatic anemia Anemia of CKD -Patient hemoglobin 6.6, drop for most recent of 8.4 -Hemoccult positive, high concern for GI bleed with hospitalization s/p EGD and colonoscopy 08/10/2022, duodenal and colonic polyps which were removed. 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 - Will transfuse 2 units PRBC -IV iron and Procrit per renal -Discussed with GI, plan for endoscopy with possible VCE tomorrow(if no source of bleed could be identified).  Will keep n.p.o. after  midnight  ESRD -She is on hemodialysis Monday Wednesday Friday, dialyzed today, had 1 hour left from dialysis, but evidence of vascular congestion on imaging, and positive JVD, she is anuric, received 500 cc of IV fluid in ED and about to receive 2 units PRBC, high possibility she will need HD tomorrow, renal consulted they will evaluate tomorrow  Altered mental status Delirium -Patient has been more confused over the last few days per husband, CT head with no acute findings  CAD:  -Status post CABG, continue to hold aspirin due to above, continue with metoprolol and statin   HTN:  Pressure stable continue with metoprolol  for now, hold lisinopril    A fib;  -Not a candidate for anticoagulation -Continue with amiodarone and metoprolol   Hypothyroidism:  Continue with Synthroid   DM;  With history of diabetes mellitus, her most recent A1c is 5.5 08/07/2022, at this point no indication for any medications      DVT Prophylaxis  SCDs   AM Labs Ordered, also please review Full Orders  Family Communication: Admission, patients condition and plan of care including  tests being ordered have been discussed with the patient and Husband at bedside who indicate understanding and agree with the plan and Code Status.  Code Status Full, confirmed by husband  Likely DC to  home  Condition GUARDED    Consults called: renal Dr Mauri Brooklyn and GI Dr. Abbey Chatters  Admission status: inpatient    Time spent in minutes : 79 minutes   Phillips Climes M.D on 08/26/2022 at 4:37 PM   [facility] - Office  [phone]

## 2022-08-27 ENCOUNTER — Encounter (HOSPITAL_COMMUNITY)
Admission: EM | Disposition: A | Discharge status: Home Health | Payer: Self-pay | Source: Ambulatory Visit | Attending: Family Medicine

## 2022-08-27 ENCOUNTER — Inpatient Hospital Stay (HOSPITAL_COMMUNITY): Payer: Medicare HMO | Admitting: Anesthesiology

## 2022-08-27 DIAGNOSIS — I480 Paroxysmal atrial fibrillation: Secondary | ICD-10-CM | POA: Diagnosis not present

## 2022-08-27 DIAGNOSIS — R410 Disorientation, unspecified: Secondary | ICD-10-CM | POA: Diagnosis not present

## 2022-08-27 DIAGNOSIS — N186 End stage renal disease: Secondary | ICD-10-CM | POA: Diagnosis not present

## 2022-08-27 DIAGNOSIS — D631 Anemia in chronic kidney disease: Secondary | ICD-10-CM

## 2022-08-27 DIAGNOSIS — R195 Other fecal abnormalities: Secondary | ICD-10-CM

## 2022-08-27 DIAGNOSIS — D62 Acute posthemorrhagic anemia: Secondary | ICD-10-CM | POA: Diagnosis not present

## 2022-08-27 DIAGNOSIS — K5520 Angiodysplasia of colon without hemorrhage: Secondary | ICD-10-CM

## 2022-08-27 DIAGNOSIS — N189 Chronic kidney disease, unspecified: Secondary | ICD-10-CM

## 2022-08-27 HISTORY — PX: ESOPHAGOGASTRODUODENOSCOPY (EGD) WITH PROPOFOL: SHX5813

## 2022-08-27 HISTORY — PX: GIVENS CAPSULE STUDY: SHX5432

## 2022-08-27 LAB — CBC
HCT: 33.2 % — ABNORMAL LOW (ref 36.0–46.0)
Hemoglobin: 10.8 g/dL — ABNORMAL LOW (ref 12.0–15.0)
MCH: 33.2 pg (ref 26.0–34.0)
MCHC: 32.5 g/dL (ref 30.0–36.0)
MCV: 102.2 fL — ABNORMAL HIGH (ref 80.0–100.0)
Platelets: 277 10*3/uL (ref 150–400)
RBC: 3.25 MIL/uL — ABNORMAL LOW (ref 3.87–5.11)
RDW: 24.1 % — ABNORMAL HIGH (ref 11.5–15.5)
WBC: 6.2 10*3/uL (ref 4.0–10.5)
nRBC: 0.3 % — ABNORMAL HIGH (ref 0.0–0.2)

## 2022-08-27 LAB — BASIC METABOLIC PANEL
Anion gap: 13 (ref 5–15)
BUN: 20 mg/dL (ref 8–23)
CO2: 24 mmol/L (ref 22–32)
Calcium: 8.5 mg/dL — ABNORMAL LOW (ref 8.9–10.3)
Chloride: 100 mmol/L (ref 98–111)
Creatinine, Ser: 4.59 mg/dL — ABNORMAL HIGH (ref 0.44–1.00)
GFR, Estimated: 9 mL/min — ABNORMAL LOW (ref >60)
Glucose, Bld: 89 mg/dL (ref 70–99)
Potassium: 4.5 mmol/L (ref 3.5–5.1)
Sodium: 137 mmol/L (ref 135–145)

## 2022-08-27 LAB — HEPATITIS B SURFACE ANTIBODY, QUANTITATIVE: Hep B S AB Quant (Post): >1000 m[IU]/mL

## 2022-08-27 SURGERY — ESOPHAGOGASTRODUODENOSCOPY (EGD) WITH PROPOFOL
Anesthesia: General

## 2022-08-27 MED ORDER — HEPARIN SODIUM (PORCINE) 1000 UNIT/ML IJ SOLN
INTRAMUSCULAR | Status: AC
  Administered 2022-08-27: 1000 [IU]
  Filled 2022-08-27: qty 4

## 2022-08-27 MED ORDER — RENA-VITE PO TABS
1.0000 | ORAL_TABLET | Freq: Every day | ORAL | Status: DC
  Administered 2022-08-28 – 2022-08-29 (×2): 1 via ORAL
  Filled 2022-08-27 (×3): qty 1

## 2022-08-27 MED ORDER — ALTEPLASE 2 MG IJ SOLR: 2.0000 mg | Freq: Once | INTRAMUSCULAR | Status: DC | PRN

## 2022-08-27 MED ORDER — LEVOTHYROXINE SODIUM 75 MCG PO TABS
75.0000 ug | ORAL_TABLET | Freq: Every day | ORAL | Status: DC
  Administered 2022-08-27 – 2022-08-29 (×3): 75 ug via ORAL
  Filled 2022-08-27 (×3): qty 1

## 2022-08-27 MED ORDER — ONDANSETRON HCL 4 MG/2ML IJ SOLN: 4.0000 mg | Freq: Four times a day (QID) | INTRAMUSCULAR | Status: DC | PRN

## 2022-08-27 MED ORDER — AMIODARONE HCL 200 MG PO TABS
100.0000 mg | ORAL_TABLET | Freq: Every day | ORAL | Status: DC
  Administered 2022-08-28: 100 mg via ORAL
  Filled 2022-08-27 (×3): qty 1

## 2022-08-27 MED ORDER — BENZONATATE 100 MG PO CAPS: 100.0000 mg | ORAL_CAPSULE | Freq: Three times a day (TID) | ORAL | Status: DC | PRN

## 2022-08-27 MED ORDER — HEPARIN SODIUM (PORCINE) 1000 UNIT/ML DIALYSIS: 1000.0000 [IU] | INTRAMUSCULAR | Status: DC | PRN

## 2022-08-27 MED ORDER — FOLIC ACID 1 MG PO TABS
1.0000 mg | ORAL_TABLET | Freq: Every day | ORAL | Status: DC
  Administered 2022-08-28 – 2022-08-29 (×2): 1 mg via ORAL
  Filled 2022-08-27 (×3): qty 1

## 2022-08-27 MED ORDER — LIDOCAINE-PRILOCAINE 2.5-2.5 % EX CREA: 1.0000 | TOPICAL_CREAM | CUTANEOUS | Status: DC | PRN

## 2022-08-27 MED ORDER — LACTATED RINGERS IV SOLN: INTRAVENOUS | Status: DC | PRN

## 2022-08-27 MED ORDER — SEVELAMER CARBONATE 800 MG PO TABS
3200.0000 mg | ORAL_TABLET | Freq: Three times a day (TID) | ORAL | Status: DC
  Administered 2022-08-28 – 2022-08-29 (×4): 3200 mg via ORAL
  Filled 2022-08-27 (×4): qty 4

## 2022-08-27 MED ORDER — METOPROLOL TARTRATE 25 MG PO TABS
12.5000 mg | ORAL_TABLET | Freq: Two times a day (BID) | ORAL | Status: DC
  Administered 2022-08-27 – 2022-08-28 (×4): 12.5 mg via ORAL
  Filled 2022-08-27 (×6): qty 1

## 2022-08-27 MED ORDER — PENTAFLUOROPROP-TETRAFLUOROETH EX AERO: 1.0000 | INHALATION_SPRAY | CUTANEOUS | Status: DC | PRN

## 2022-08-27 MED ORDER — LIDOCAINE HCL (PF) 1 % IJ SOLN: 5.0000 mL | INTRAMUSCULAR | Status: DC | PRN

## 2022-08-27 MED ORDER — PROPOFOL 10 MG/ML IV BOLUS
INTRAVENOUS | Status: AC
  Filled 2022-08-27: qty 20

## 2022-08-27 MED ORDER — SODIUM CHLORIDE 0.9 % IV SOLN: INTRAVENOUS | Status: DC

## 2022-08-27 MED ORDER — ROSUVASTATIN CALCIUM 10 MG PO TABS
10.0000 mg | ORAL_TABLET | Freq: Every day | ORAL | Status: DC
  Administered 2022-08-28 – 2022-08-29 (×2): 10 mg via ORAL
  Filled 2022-08-27 (×3): qty 1

## 2022-08-27 MED ORDER — ONDANSETRON HCL 4 MG PO TABS: 4.0000 mg | ORAL_TABLET | Freq: Four times a day (QID) | ORAL | Status: DC | PRN

## 2022-08-27 MED ORDER — ALBUTEROL SULFATE (2.5 MG/3ML) 0.083% IN NEBU: 2.5000 mg | INHALATION_SOLUTION | RESPIRATORY_TRACT | Status: DC | PRN

## 2022-08-27 MED ORDER — PROPOFOL 10 MG/ML IV BOLUS
INTRAVENOUS | Status: DC | PRN
  Administered 2022-08-27 (×2): 30 mg via INTRAVENOUS

## 2022-08-27 NOTE — Progress Notes (Addendum)
Patient is back from dialysis. patients bp I 165/64 and hr 72. patient has not received metoprolol all day due to being in the procedure this morning and then straight to dialysis. patient receives metoprolol again at 10 tonight. MD Wynetta Emery notified to see if the dose needs to be given now or just wait until 10 tonight. MD Wynetta Emery states to wait until next scheduled dose. Nurse Lyndee Leo notified.

## 2022-08-27 NOTE — Progress Notes (Signed)
Telemetry called stating patient's hr was 104 and was in and out of afib. Patient is currently in dialysis so Benay Pillow was notified as well as MD Wynetta Emery.

## 2022-08-27 NOTE — Progress Notes (Signed)
PROGRESS NOTE   Shawna [name]  [id number] DOB: 1941/03/02 DOA: 08/26/2022 PCP: [facility]   Chief Complaint  Patient presents with   Tachycardia   Level of care: Telemetry  Brief Admission History:   82 y.o. female,with medical history significant of ESRD on MWF HD, CAD s/p CABG, chronic systolic CHF, HLD, HTN, and afib on anticoagulation, patient patient with recent hospitalizationr for GI bleed  s/p EGD and colonoscopy 08/10/2022, duodenal and colonic polyps which were removed. 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 -Patient was sent from HD center for tachycardia, patient had 1 hour left during HD, she was noted to be tachycardic, unclear A-fib or sinus tachycardia, so she was sent to ED for further evaluation, patient is altered and confused, husband reports she had been infused since his recent discharge on 08/17/2022, otherwise no significant complaints, he reports last time she had a BM yesterday, but he is unaware if she had any melena, he denies ever having any nausea, coffee-ground emesis. -In ED she received 500 cc fluid bolus, no further tachycardia, her hemoglobin was noted to be 6.6, goal still pending, chest x-ray with evidence of vascular congestion, was ordered units PRBC transfusion and triage hospitalist consulted to admit.     Assessment and Plan:  Acute on chronic blood loss anemia Symptomatic anemia Anemia of CKD -Patient hemoglobin 6.6, drop for most recent of 8.4 -Hemoccult positive, high concern for GI bleed with hospitalization s/p EGD and colonoscopy 08/10/2022, duodenal and colonic polyps which were removed. 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 - Will transfuse 2 units PRBC -IV iron and Procrit per renal -Discussed with GI, plan for VCE today.  Follow CBC.  Hg up to 10.8 after transfusion.    ESRD -She is on hemodialysis Monday Wednesday Friday, dialyzed today, had 1 hour left from dialysis, but evidence of vascular congestion on imaging, and positive JVD, she is  anuric, received 500 cc of IV fluid in ED and about to receive 2 units PRBC, HD planned for today 2/17 by nephrologist   Altered mental status Delirium/Vascular Dementia  -Patient has been more confused over the last few days per husband, CT head with no acute findings.  -delirium precautions    CAD:  -Status post CABG, continue to hold aspirin due to above, continue with metoprolol and statin   HTN:  Pressure stable continue with metoprolol for now, hold lisinopril    A fib;  -Not a candidate for anticoagulation -Continue with amiodarone and metoprolol   Hypothyroidism:  Continue with Synthroid   DM; diet controlled;  With history of diabetes mellitus, her most recent A1c is 5.5 08/07/2022, at this point no indication for any medications   DVT prophylaxis: SCD Code Status: Full  Family Communication: husband bedside     Consultants:  Nephrology GI  Procedures:  VCE  Antimicrobials:     Subjective: Pt is very confused today; did not recognize husband; intermittently agitated;   Objective: Vitals:   08/27/22 1115 08/27/22 1130 08/27/22 1153 08/27/22 1224  BP: (!) 114/47  (!) 141/105 (!) 152/90  Pulse: (!) 56 (!) 59 (!) 111 (!) 113  Resp: (!) 30 (!) 23 (!) 21 18  Temp:   97.6 F (36.4 C) 97.7 F (36.5 C)  TempSrc:      SpO2: 96% 100% 100% 100%  Height:        Intake/Output Summary (Last 24 hours) at 08/27/2022 1318 Last data filed at 08/27/2022 1111 Gross per 24 hour  Intake 1664 ml  Output --  Net 1664 ml   There were no vitals filed for this visit. Examination:  General exam: frail confused female, awake, alert, agitated at times; Appears calm and comfortable  Respiratory system: Clear to auscultation. Respiratory effort normal. Cardiovascular system: irregularly irregular; normal S1 & S2 heard. No JVD, murmurs, rubs, gallops or clicks. No pedal edema. Gastrointestinal system: Abdomen is nondistended, soft and nontender. No organomegaly or masses felt.  Normal bowel sounds heard. Central nervous system: Alert and oriented. No focal neurological deficits. Extremities: Symmetric 5 x 5 power. Skin: No rashes, lesions or ulcers. Psychiatry: Judgement and insight appear poor. Mood & affect agitated.   Data Reviewed: I have personally reviewed following labs and imaging studies  CBC: Recent Labs  Lab 08/26/22 1410 08/27/22 0718  WBC 6.6 6.2  NEUTROABS 4.6  --   HGB 6.6* 10.8*  HCT 21.0* 33.2*  MCV 115.4* 102.2*  PLT 306 99991111    Basic Metabolic Panel: Recent Labs  Lab 08/26/22 1410 08/27/22 0718  NA 135 137  K 3.3* 4.5  CL 99 100  CO2 27 24  GLUCOSE 105* 89  BUN 14 20  CREATININE 3.20* 4.59*  CALCIUM 8.3* 8.5*    CBG: No results for input(s): "GLUCAP" in the last 168 hours.  No results found for this or any previous visit (from the past 240 hour(s)).   Radiology Studies: CT Head Wo Contrast  Result Date: 08/26/2022 CLINICAL DATA:  Delirium. EXAM: CT HEAD WITHOUT CONTRAST TECHNIQUE: Contiguous axial images were obtained from the base of the skull through the vertex without intravenous contrast. RADIATION DOSE REDUCTION: This exam was performed according to the departmental dose-optimization program which includes automated exposure control, adjustment of the mA and/or kV according to patient size and/or use of iterative reconstruction technique. COMPARISON:  08/06/2022 FINDINGS: Brain: There is no evidence for acute hemorrhage, hydrocephalus, mass lesion, or abnormal extra-axial fluid collection. No definite CT evidence for acute infarction. Patchy low attenuation in the deep hemispheric and periventricular white matter is nonspecific, but likely reflects chronic microvascular ischemic demyelination. Vascular: No hyperdense vessel or unexpected calcification. Skull: No evidence for fracture. No worrisome lytic or sclerotic lesion. Sinuses/Orbits: The visualized paranasal sinuses and mastoid air cells are clear. Visualized portions  of the globes and intraorbital fat are unremarkable. Other: None. IMPRESSION: 1. No acute intracranial abnormality. 2. Chronic small vessel ischemic disease. Electronically Signed   By: Misty Stanley M.D.   On: 08/26/2022 14:35   DG Chest Portable 1 View  Result Date: 08/26/2022 CLINICAL DATA:  Dyspnea. EXAM: PORTABLE CHEST 1 VIEW COMPARISON:  08/06/2022 FINDINGS: There is a left chest wall dialysis catheter with tips in the right atrium. Previous median sternotomy and CABG procedure. No pleural fluid or airspace disease. Pulmonary vascular congestion noted. IMPRESSION: Pulmonary vascular congestion. Electronically Signed   By: Kerby Moors M.D.   On: 08/26/2022 14:21    Scheduled Meds:  amiodarone  100 mg Oral Daily   Chlorhexidine Gluconate Cloth  6 each Topical 99991111   folic acid  1 mg Oral Daily   levothyroxine  75 mcg Oral QAC breakfast   metoprolol tartrate  12.5 mg Oral BID   multivitamin  1 tablet Oral Daily   rosuvastatin  10 mg Oral Daily   sevelamer carbonate  3,200 mg Oral TID WC   Continuous Infusions:   LOS: 1 day   Time spent: 36 mins  Burech Mcfarland Wynetta Emery, MD How to contact the [facility] Attending or Consulting provider [place] or covering provider  during after hours 7P -7A, for this patient?  Check the care team in [facility] and look for a) attending/consulting [facility] provider listed and b) the [facility] team listed Log into [email or web address] and use [place]'s universal password to access. If you do not have the password, please contact the hospital operator. Locate the [facility] provider you are looking for under [facility] and page to a number that you can be directly reached. If you still have difficulty reaching the provider, please page the [facility] (Director on Call) for the Hospitalists listed on amion for assistance.  08/27/2022, 1:18 PM

## 2022-08-27 NOTE — Anesthesia Preprocedure Evaluation (Signed)
Anesthesia Evaluation  Patient identified by MRN, date of birth, ID band Patient awake    Reviewed: Allergy & Precautions, H&P , NPO status , Patient's Chart, lab work & pertinent test results, reviewed documented beta blocker date and time   Airway Mallampati: II  TM Distance: >3 FB Neck ROM: full    Dental no notable dental hx.    Pulmonary neg pulmonary ROS, shortness of breath, pneumonia   Pulmonary exam normal breath sounds clear to auscultation       Cardiovascular Exercise Tolerance: Good hypertension, + CAD, + Past MI and +CHF  negative cardio ROS  Rhythm:regular Rate:Normal     Neuro/Psych  PSYCHIATRIC DISORDERS Anxiety     TIA Neuromuscular disease negative neurological ROS  negative psych ROS   GI/Hepatic negative GI ROS, Neg liver ROS, PUD,GERD  ,,  Endo/Other  negative endocrine ROSdiabetesHypothyroidism    Renal/GU Renal diseasenegative Renal ROS  negative genitourinary   Musculoskeletal   Abdominal   Peds  Hematology negative hematology ROS (+) Blood dyscrasia, anemia   Anesthesia Other Findings   Reproductive/Obstetrics negative OB ROS                             Anesthesia Physical Anesthesia Plan  ASA: 4 and emergent  Anesthesia Plan: General   Post-op Pain Management:    Induction:   PONV Risk Score and Plan: Propofol infusion  Airway Management Planned:   Additional Equipment:   Intra-op Plan:   Post-operative Plan:   Informed Consent: I have reviewed the patients History and Physical, chart, labs and discussed the procedure including the risks, benefits and alternatives for the proposed anesthesia with the patient or authorized representative who has indicated his/her understanding and acceptance.     Dental Advisory Given  Plan Discussed with: CRNA  Anesthesia Plan Comments:        Anesthesia Quick Evaluation

## 2022-08-27 NOTE — Procedures (Signed)
Hemodialysis Note:  Patient presented to kidney dialysis unit alert but disoriented x4. Agitated, restless, and required frequent redirection. Treatment started without issue via dialysis catheter. Was able to remove 3L fluid and completed 3.5 hours as ordered. 83.2L of blood volume processed. Blood pressures stable throughout treatment. At end of treatment, patient complained of headache and repeatedly asked for Aspirin. Due to patient's history of GI bleeds and need for transfusions, patient's husband was educated on the importance of not taking Aspirin for pain. Stable post treatment. Handoff given to primary nurse. Transported back to floor.

## 2022-08-27 NOTE — Hospital Course (Signed)
82 y.o. female,with medical history significant of ESRD on MWF HD, CAD s/p CABG, chronic systolic CHF, HLD, HTN, and afib on anticoagulation, patient patient with recent hospitalizationr for GI bleed  s/p EGD and colonoscopy 08/10/2022, duodenal and colonic polyps which were removed. 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 -Patient was sent from HD center for tachycardia, patient had 1 hour left during HD, she was noted to be tachycardic, unclear A-fib or sinus tachycardia, so she was sent to ED for further evaluation, patient is altered and confused, husband reports she had been infused since his recent discharge on 08/17/2022, otherwise no significant complaints, he reports last time she had a BM yesterday, but he is unaware if she had any melena, he denies ever having any nausea, coffee-ground emesis. -In ED she received 500 cc fluid bolus, no further tachycardia, her hemoglobin was noted to be 6.6, goal still pending, chest x-ray with evidence of vascular congestion, was ordered units PRBC transfusion and triage hospitalist consulted to admit.

## 2022-08-27 NOTE — Consult Note (Signed)
Consulting  Provider: Dr. Waldron Labs Primary Care Physician:  [facility] Primary Gastroenterologist: Previously Dr. Laural Golden, now Dr. Jenetta Downer   Reason for Consultation: Acute on chronic blood loss anemia  HPI:  Shawna [name] is a 82 y.o. female with a past medical history of dementia, ESRD on hemodialysis, atrial fibrillation, only on ASA, CAD, heart failure, carotid artery disease, small bowel AVMs, recurrent GI bleeding, colon cancer status post resection 1992, iron deficiency anemia, duodenal adenoma, who presented to [facility] due to worsening anemia.  Patient with recent admission to [facility] for similar presentation.  Underwent EGD and colonoscopy 08/11/2022.  1 small bowel AVM ablated, small duodenal polyp removed (duodenal adenoma), colonoscopy with healthy-appearing anastomosis x 2, 6 polyps removed in the transverse descending sigmoid colons, tubular adenomas on pathology.  Difficult to obtain full HPI given patient's underlying dementia.  Denies any melena hematochezia.  No abdominal pain.  Reportedly sent from hemodialysis due to tachycardia.  Altered and confused in the ER.  In the ER found to have a hemoglobin of 6.6, received 2 units PRBCs, Hemoglobin 10.8 this morning.  Procedure history: 07/2016 EGD for anemia, prior duodenal AVMs, recent NSTEMI and pending cardiac cath.  2 cm hiatal hernia.  Normal stomach and esophagus.  Solitary duodenal versus jejunal polyp was not removed.  Nonbleeding jejunal AVM treated with APC and may have been source for anemia.  02/2019 colonoscopy.  For evaluation gastrointestinal bleeding.  Pandiverticulosis.  Patent ileocolonic anastomosis.  Removed 2 small polyps (TAs) from ascending colon.  Piecemeal resection using a lift technique of large polyp (TA) in transverse colon.  09/2019 EGD for evaluation duodenal polyp.  Esophagus, Z-line normal.  3 cm hiatal hernia.  Normal stomach and duodenal bulb.  Solitary, diminutive,  sessile polyp at D2 was biopsied (adenoma wo HGD).  Exam completed to third duodenum.  02/05/2021 EGD for melena, blood loss anemia.  Showed mild severity esophageal candidiasis without bleeding.  Gastritis, nonbleeding.  Normal examined duodenum to D2.  Treated with Diflucan for 14 days and continue daily oral PPI.  04/22/2021 VCE.  Successful, complete study.  Small, flat polyp in either second or third portion duodenum with no stigmata of bleeding.  2 polyps at distal small bowel, 1 small size the other medium.  No bleeding stigmata in either of these polyps.  Dr Laural Golden rec push enteroscopy.    06/08/2021 colonoscopy, enteroscopy were cxld due to IV infiltration RUE.  LUE not amenable to IV as her fistula is in that arm..  Plans for outpt PICC line and proceed with GI procedures in January 2022, but procedures never happened. Meds include low-dose aspirin.  Prilosec 20 mg/day, Zofran prn.  Folic acid. Mircera, last dose 1/22.   08/11/2022 EGD 2 cm hiatal hernia, normal esophagus otherwise, duodenal AVM status post APC, 5 mm duodenal polyp removed (duodenal adenoma)  08/11/2022 colonoscopy end-to-end ileocolonic anastomosis ascending colon, end-to-side colocolonic anastomosis rectosigmoid colon, normal neoterminal ileum, 6 polyps removed in the transverse, descending, sigmoid colons.  Tubular adenomas on pathology  Past Medical History:  Diagnosis Date   Acute on chronic respiratory failure with hypoxia ([place]) 10/10/2016   Anxiety    Arthritis    AVM (arteriovenous malformation) of colon    CAD (coronary artery disease)    a. s/p CABG in 2013 b. DES to D1 in 10/2016. c. cath in 07/2018 showing patent grafts with occlusion of D1 at prior stent site and progression of PDA disease --> medical management recommended  Carotid artery disease (HCC)    a. [id number] LICA, [id number]    Chronic bronchitis (HCC)    Chronic HFrEF (heart failure with reduced ejection fraction) (HCC)    Colon cancer ([place]) 1992    Esophageal stricture    ESRD on hemodialysis ([place])    ESRD due to HTN, started dialysis 2011 and gets HD at [facility] with Dr Hinda Lenis on MWF schedule.  Access is LUA AVF as of Sept 2014.    GERD (gastroesophageal reflux disease)    High cholesterol 12/2011   History of blood transfusion 07/2011; 12/2011; 01/2012 X 2; 04/2012   History of gout    History of lower GI bleeding    Hypertension    Iron deficiency anemia    Jugular vein occlusion, right (HCC)    Mitral regurgitation    a. Moderate by echo, 02/2012   Mitral valve disease    NSVT (nonsustained ventricular tachycardia) ([place])    Ovarian cancer ([place]) 1992   PAF (paroxysmal atrial fibrillation) ([place])    Pneumonia ~ 2009   PUD (peptic ulcer disease)    TIA (transient ischemic attack)    Tricuspid valve disease     Past Surgical History:  Procedure Laterality Date   A/V SHUNTOGRAM Left 03/19/2019   Procedure: A/V SHUNTOGRAM;  Surgeon: Katha Cabal, MD;  Location: [place] [facility];  Service: Cardiovascular;  Laterality: Left;   ABDOMINAL HYSTERECTOMY  1992   APPENDECTOMY  06/1990   AV FISTULA PLACEMENT  07/2009   left upper arm   AV FISTULA PLACEMENT Right 09/06/2016   Procedure: RIGHT FOREARM ARTERIOVENOUS (AV) GRAFT;  Surgeon: Elam Dutch, MD;  Location: [place];  Service: Vascular;  Laterality: Right;   AV FISTULA PLACEMENT N/A 02/24/2017   Procedure: INSERTION OF ARTERIOVENOUS (AV) GORE-TEX GRAFT ARM (BRACHIAL AXILLARY);  Surgeon: Katha Cabal, MD;  Location: [facility];  Service: Vascular;  Laterality: N/A;   [place] Right 09/06/2016   Procedure: REMOVAL OF Right Arm ARTERIOVENOUS GORETEX GRAFT and Vein Patch angioplasty of brachial artery;  Surgeon: Angelia Mould, MD;  Location: [place];  Service: Vascular;  Laterality: Right;   BIOPSY  09/26/2019   Procedure: BIOPSY;  Surgeon: Rogene Houston, MD;  [facility];  Service: Endoscopy;;   COLON RESECTION  1992   COLON SURGERY      COLONOSCOPY N/A 03/09/2019   Procedure: COLONOSCOPY;  Surgeon: Rogene Houston, MD;  [facility];  Service: Endoscopy;  Laterality: N/A;   COLONOSCOPY N/A 08/11/2022   Procedure: COLONOSCOPY;  Surgeon: Jerene Bears, MD;  Location: [place];  Service: Gastroenterology;  Laterality: N/A;   COLONOSCOPY WITH PROPOFOL N/A 06/08/2021   Procedure: COLONOSCOPY WITH PROPOFOL;  Surgeon: Harvel Quale, MD;  [facility];  Service: Gastroenterology;  Laterality: N/A;  9:05 /Patient is on dialysis Mon Wed Fri   CORONARY ANGIOPLASTY WITH STENT PLACEMENT  12/15/11   "2"   CORONARY ANGIOPLASTY WITH STENT PLACEMENT  y/2013   "1; makes total of 3" (05/02/2012)   CORONARY ARTERY BYPASS GRAFT  06/13/2012   Procedure: CORONARY ARTERY BYPASS GRAFTING (CABG);  Surgeon: Grace Isaac, MD;  Location: [place];  Service: Open Heart Surgery;  Laterality: N/A;  cabg x four;  using left internal mammary artery, and left leg greater saphenous vein harvested endoscopically   CORONARY STENT INTERVENTION N/A 10/13/2016   Procedure: Coronary Stent Intervention;  Surgeon: [place] Sine, MD;  Location: [place] [facility];  Service: Cardiovascular;  Laterality: N/A;   DIALYSIS/PERMA CATHETER REMOVAL N/A 04/18/2017   Procedure: DIALYSIS/PERMA CATHETER REMOVAL;  Surgeon: Katha Cabal, MD;  Location: [place] [facility];  Service: Cardiovascular;  Laterality: N/A;   DILATION AND CURETTAGE OF UTERUS     ENTEROSCOPY N/A 06/08/2021   Procedure: PUSH ENTEROSCOPY;  Surgeon: Harvel Quale, MD;  [facility];  Service: Gastroenterology;  Laterality: N/A;   ESOPHAGOGASTRODUODENOSCOPY  01/20/2012   Procedure: ESOPHAGOGASTRODUODENOSCOPY (EGD);  Surgeon: Ladene Artist, MD,FACG;  [facility];  Service: Endoscopy;  Laterality: N/A;   ESOPHAGOGASTRODUODENOSCOPY N/A 03/26/2013   Procedure: ESOPHAGOGASTRODUODENOSCOPY (EGD);  Surgeon: Irene Shipper, MD;  Location: [facility] ENDOSCOPY;   Service: Endoscopy;  Laterality: N/A;   ESOPHAGOGASTRODUODENOSCOPY N/A 04/30/2015   Procedure: ESOPHAGOGASTRODUODENOSCOPY (EGD);  Surgeon: Rogene Houston, MD;  [facility];  Service: Endoscopy;  Laterality: N/A;  1pm - moved to 10/20 @ 1:10   ESOPHAGOGASTRODUODENOSCOPY N/A 07/29/2016   Procedure: ESOPHAGOGASTRODUODENOSCOPY (EGD);  Surgeon: Manus Gunning, MD;  Location: [place];  Service: Gastroenterology;  Laterality: N/A;  enteroscopy   ESOPHAGOGASTRODUODENOSCOPY N/A 09/26/2019   Procedure: ESOPHAGOGASTRODUODENOSCOPY (EGD);  Surgeon: Rogene Houston, MD;  [facility];  Service: Endoscopy;  Laterality: N/A;  1250   ESOPHAGOGASTRODUODENOSCOPY N/A 08/11/2022   Procedure: ESOPHAGOGASTRODUODENOSCOPY (EGD);  Surgeon: Jerene Bears, MD;  Location: [facility] ENDOSCOPY;  Service: Gastroenterology;  Laterality: N/A;   ESOPHAGOGASTRODUODENOSCOPY (EGD) WITH PROPOFOL N/A 02/05/2021   Procedure: ESOPHAGOGASTRODUODENOSCOPY (EGD) WITH PROPOFOL;  Surgeon: Eloise Harman, DO;  [facility];  Service: Endoscopy;  Laterality: N/A;   GIVENS CAPSULE STUDY N/A 03/07/2019   Procedure: GIVENS CAPSULE STUDY;  Surgeon: Rogene Houston, MD;  [facility];  Service: Endoscopy;  Laterality: N/A;  7:30   GIVENS CAPSULE STUDY N/A 04/22/2021   Procedure: GIVENS CAPSULE STUDY;  Surgeon: Rogene Houston, MD;  [facility];  Service: Endoscopy;  Laterality: N/A;  7:30   HOT HEMOSTASIS N/A 08/11/2022   Procedure: HOT HEMOSTASIS (ARGON PLASMA COAGULATION/BICAP);  Surgeon: Jerene Bears, MD;  Location: [facility] ENDOSCOPY;  Service: Gastroenterology;  Laterality: N/A;   INSERTION OF DIALYSIS CATHETER N/A 10/05/2020   Procedure: ABORTED TUNNELED DIALYSIS CATHETER PLACEMENT RIGHT INTERNAL JUGULAR VEIN ;  Surgeon: Virl Cagey, MD;  [facility];  Service: General;  Laterality: N/A;   INTRAOPERATIVE TRANSESOPHAGEAL ECHOCARDIOGRAM  06/13/2012   Procedure: INTRAOPERATIVE  TRANSESOPHAGEAL ECHOCARDIOGRAM;  Surgeon: Grace Isaac, MD;  Location: [place];  Service: Open Heart Surgery;  Laterality: N/A;   IR DIALY SHUNT INTRO NEEDLE/INTRACATH INITIAL W/IMG LEFT Left 10/06/2020   IR FLUORO GUIDE CV LINE RIGHT  06/17/2020   IR GENERIC HISTORICAL  07/26/2016   IR FLUORO GUIDE CV LINE RIGHT 07/26/2016 Greggory Keen, MD [facility]   IR GENERIC HISTORICAL  07/26/2016   IR US GUIDE VASC ACCESS RIGHT 07/26/2016 Greggory Keen, MD [facility]   IR GENERIC HISTORICAL  08/02/2016   IR US GUIDE VASC ACCESS RIGHT 08/02/2016 Greggory Keen, MD [facility]   IR GENERIC HISTORICAL  08/02/2016   IR FLUORO GUIDE CV LINE RIGHT 08/02/2016 Greggory Keen, MD [facility]   IR RADIOLOGY PERIPHERAL GUIDED IV START  03/28/2017   IR REMOVAL TUN CV CATH W/O FL  08/11/2020   IR THROMBECTOMY AV FISTULA W/THROMBOLYSIS INC/SHUNT/IMG LEFT Left 06/17/2020   IR US GUIDE VASC ACCESS LEFT  06/17/2020   IR US GUIDE VASC ACCESS RIGHT  03/28/2017   IR US  GUIDE VASC ACCESS RIGHT  06/17/2020   LEFT HEART CATH AND CORONARY ANGIOGRAPHY N/A 09/20/2016   Procedure: Left Heart Cath and Coronary Angiography;  Surgeon: [name], MD;  Location: [place] [facility];  Service: Cardiovascular;  Laterality: N/A;   LEFT HEART CATH AND CORS/GRAFTS ANGIOGRAPHY N/A 10/13/2016   Procedure: Left Heart Cath and Cors/Grafts Angiography;  Surgeon: [place] Sine, MD;  Location: [place] [facility];  Service: Cardiovascular;  Laterality: N/A;   LEFT HEART CATH AND CORS/GRAFTS ANGIOGRAPHY N/A 07/13/2018   Procedure: LEFT HEART CATH AND CORS/GRAFTS ANGIOGRAPHY;  Surgeon: Martinique, Peter M, MD;  Location: [place] [facility];  Service: Cardiovascular;  Laterality: N/A;   LEFT HEART CATH AND CORS/GRAFTS ANGIOGRAPHY N/A 07/22/2021   Procedure: LEFT HEART CATH AND CORS/GRAFTS ANGIOGRAPHY;  Surgeon: Lorretta Harp, MD;  Location: [place] [facility];  Service: Cardiovascular;  Laterality: N/A;   LEFT HEART CATHETERIZATION WITH CORONARY ANGIOGRAM  N/A 12/15/2011   Procedure: LEFT HEART CATHETERIZATION WITH CORONARY ANGIOGRAM;  Surgeon: Burnell Blanks, MD;  Location: [facility] CATH LAB;  Service: Cardiovascular;  Laterality: N/A;   LEFT HEART CATHETERIZATION WITH CORONARY ANGIOGRAM N/A 01/10/2012   Procedure: LEFT HEART CATHETERIZATION WITH CORONARY ANGIOGRAM;  Surgeon: Peter M Martinique, MD;  Location: [facility] CATH LAB;  Service: Cardiovascular;  Laterality: N/A;   LEFT HEART CATHETERIZATION WITH CORONARY ANGIOGRAM N/A 06/08/2012   Procedure: LEFT HEART CATHETERIZATION WITH CORONARY ANGIOGRAM;  Surgeon: Burnell Blanks, MD;  Location: [facility] CATH LAB;  Service: Cardiovascular;  Laterality: N/A;   LEFT HEART CATHETERIZATION WITH CORONARY/GRAFT ANGIOGRAM N/A 12/10/2013   Procedure: LEFT HEART CATHETERIZATION WITH Beatrix Fetters;  Surgeon: [name], MD;  Location: [facility] CATH LAB;  Service: Cardiovascular;  Laterality: N/A;   OVARY SURGERY     ovarian cancer   POLYPECTOMY  03/09/2019   Procedure: POLYPECTOMY;  Surgeon: Rogene Houston, MD;  [facility];  Service: Endoscopy;;  cecal    POLYPECTOMY N/A 09/26/2019   Procedure: DUODENAL POLYPECTOMY;  Surgeon: Rogene Houston, MD;  [facility];  Service: Endoscopy;  Laterality: N/A;   POLYPECTOMY  08/11/2022   Procedure: POLYPECTOMY;  Surgeon: Jerene Bears, MD;  Location: [place];  Service: Gastroenterology;;   REVISION OF ARTERIOVENOUS GORETEX GRAFT N/A 02/24/2017   Procedure: REVISION OF ARTERIOVENOUS GORETEX GRAFT (RESECTION);  Surgeon: Katha Cabal, MD;  Location: [facility];  Service: Vascular;  Laterality: N/A;   REVISON OF ARTERIOVENOUS FISTULA Left 06/19/2020   Procedure: REVISION OF LEFT UPPER ARM AV GRAFT WITH INTERPOSITION JUMP GRAFT USING 6MM GORE LIMB;  Surgeon: Marty Heck, MD;  Location: [place];  Service: Vascular;  Laterality: Left;   SHUNTOGRAM N/A 10/15/2013   Procedure: Fistulogram;  Surgeon: Serafina Mitchell, MD;  Location: [facility] CATH  LAB;  Service: Cardiovascular;  Laterality: N/A;   THROMBECTOMY / ARTERIOVENOUS GRAFT REVISION  2011   left upper arm   TUBAL LIGATION  1980's   UPPER EXTREMITY ANGIOGRAPHY Bilateral 12/06/2016   Procedure: Upper Extremity Angiography;  Surgeon: Katha Cabal, MD;  Location: [place] [facility];  Service: Cardiovascular;  Laterality: Bilateral;   UPPER EXTREMITY INTERVENTION Left 06/06/2017   Procedure: UPPER EXTREMITY INTERVENTION;  Surgeon: Katha Cabal, MD;  Location: [place] [facility];  Service: Cardiovascular;  Laterality: Left;    Prior to Admission medications   Medication Sig Start Date End Date Taking? Authorizing Provider  ALPRAZolam Duanne Moron) 0.5 MG tablet Take 1 tablet (0.5 mg total) by  mouth daily as needed for anxiety. 08/17/22  Yes Donne Hazel, MD  amiodarone (PACERONE) 100 MG tablet Take 1 tablet (100 mg total) by mouth daily. 04/27/22  Yes Nita Sells, MD  Aspirin 81 MG CAPS Take 1 capsule by mouth daily at 12 noon.   Yes [provider]  benzonatate (TESSALON) 100 MG capsule Take 100 mg by mouth 3 (three) times daily as needed for cough. 05/10/22  Yes [provider]  folic acid (FOLVITE) 1 MG tablet Take 1 mg by mouth daily. 05/24/22  Yes [provider]  hydrOXYzine (ATARAX) 25 MG tablet Take 25 mg by mouth every 6 (six) hours as needed for itching. 06/06/22  Yes [provider]  levothyroxine (SYNTHROID) 75 MCG tablet Take 1 tablet (75 mcg total) by mouth daily before breakfast. 08/14/22 09/13/22 Yes Pahwani, Einar Grad, MD  lisinopril (ZESTRIL) 20 MG tablet Take 1 tablet (20 mg total) by mouth daily. 08/18/22 09/17/22 Yes Donne Hazel, MD  metoprolol tartrate (LOPRESSOR) 25 MG tablet Take 0.5 tablets (12.5 mg total) by mouth 2 (two) times daily. 03/04/22  Yes BranchAlphonse Guild, MD  multivitamin (RENA-VIT) TABS tablet Take 1 tablet by mouth daily.   Yes [provider]  nitroGLYCERIN (NITROSTAT) 0.4 MG SL tablet Place  0.4 mg under the tongue every 5 (five) minutes as needed for chest pain.   Yes [provider]  omeprazole (PRILOSEC) 20 MG capsule Take 2 capsules (40 mg total) by mouth daily as needed (acid reflux). Patient taking differently: Take 20 mg by mouth daily. 06/12/22  Yes Aline August, MD  ondansetron (ZOFRAN-ODT) 4 MG disintegrating tablet Take 4 mg by mouth every 8 (eight) hours as needed for nausea or vomiting. 05/05/22  Yes [provider]  rosuvastatin (CRESTOR) 10 MG tablet Take 1 tablet by mouth once daily 06/06/22  Yes Strader, [place], PA-C  sevelamer carbonate (RENVELA) 800 MG tablet Take 3,200 mg by mouth in the morning and at bedtime. 12/27/21  Yes [provider]    Current Facility-Administered Medications  Medication Dose Route Frequency Provider Last Rate Last Admin   0.9 %  sodium chloride infusion   Intravenous Continuous Evalie Hargraves K, DO       albuterol (PROVENTIL) (2.5 MG/3ML) 0.083% nebulizer solution 2.5 mg  2.5 mg Nebulization Q2H PRN Elgergawy, Silver Huguenin, MD       alteplase (CATHFLO ACTIVASE) injection 2 mg  2 mg Intracatheter Once PRN Reesa Chew, MD       amiodarone (PACERONE) tablet 100 mg  100 mg Oral Daily Elgergawy, Silver Huguenin, MD   100 mg at 08/27/22 0910   benzonatate (TESSALON) capsule 100 mg  100 mg Oral TID PRN Elgergawy, Silver Huguenin, MD       Chlorhexidine Gluconate Cloth 2 % PADS 6 each  6 each Topical Q0600 Reesa Chew, MD       folic acid (FOLVITE) tablet 1 mg  1 mg Oral Daily Elgergawy, Silver Huguenin, MD       heparin injection 1,000 Units  1,000 Units Dialysis PRN Reesa Chew, MD       levothyroxine (SYNTHROID) tablet 75 mcg  75 mcg Oral QAC breakfast Elgergawy, Silver Huguenin, MD   75 mcg at 08/27/22 0535   lidocaine (PF) (XYLOCAINE) 1 % injection 5 mL  5 mL Intradermal PRN Reesa Chew, MD       lidocaine-prilocaine (EMLA) cream 1 Application  1 Application Topical PRN Reesa Chew, MD  metoprolol  tartrate (LOPRESSOR) tablet 12.5 mg  12.5 mg Oral BID Elgergawy, Silver Huguenin, MD   12.5 mg at 08/27/22 [id number]   multivitamin (RENA-VIT) tablet 1 tablet  1 tablet Oral Daily Elgergawy, Silver Huguenin, MD       ondansetron (ZOFRAN) tablet 4 mg  4 mg Oral Q6H PRN Elgergawy, Silver Huguenin, MD       Or   ondansetron (ZOFRAN) injection 4 mg  4 mg Intravenous Q6H PRN Elgergawy, Silver Huguenin, MD       pentafluoroprop-tetrafluoroeth (GEBAUERS) aerosol 1 Application  1 Application Topical PRN Reesa Chew, MD       rosuvastatin (CRESTOR) tablet 10 mg  10 mg Oral Daily Elgergawy, Silver Huguenin, MD       sevelamer carbonate (RENVELA) tablet 3,200 mg  3,200 mg Oral TID WC Elgergawy, Silver Huguenin, MD        Allergies as of 08/26/2022 - Review Complete 08/26/2022  Allergen Reaction Noted   Amlodipine Swelling 01/19/2015   Aspirin Other (See Comments) 12/15/2011   Nitrofurantoin Hives 07/08/2009   Bactrim [sulfamethoxazole-trimethoprim] Rash 12/15/2011   Contrast media [iodinated contrast media] Itching 12/15/2011   Iron Itching and Other (See Comments) 05/02/2012   Tylenol [acetaminophen] Itching and Other (See Comments) 07/08/2016   Gabapentin Other (See Comments) 02/08/2017   Iron sucrose Other (See Comments) 04/15/2021   Ranexa [ranolazine] Other (See Comments) 08/30/2020   Sucroferric oxyhydroxide Other (See Comments) 04/15/2021   Dexilant [dexlansoprazole] Other (See Comments) 05/29/2013   Hydralazine Itching 12/14/2018   Levaquin [levofloxacin in d5w] Rash 10/04/2013   Morphine and related Itching and Other (See Comments) 09/16/2014   Pantoprazole Rash 03/21/2013   Plavix [clopidogrel bisulfate] Rash 01/05/2012   Protonix [pantoprazole sodium] Rash 03/21/2013   Venofer [ferric oxide] Itching and Other (See Comments) 05/02/2012    Family History  Problem Relation Age of Onset   Heart disease Mother        Heart Disease before age 68   Hyperlipidemia Mother    Hypertension Mother    Diabetes Mother    Heart  attack Mother    Heart disease Father        Heart Disease before age 74   Hyperlipidemia Father    Hypertension Father    Diabetes Father    Diabetes Sister    Hypertension Sister    Diabetes Brother    Hyperlipidemia Brother    Heart attack Brother    Hypertension Sister    Heart attack Brother    Colon cancer Child 76   Other Other        noncontributory for early CAD   Esophageal cancer Neg Hx    Liver disease Neg Hx    Kidney disease Neg Hx    Colon polyps Neg Hx     Social History   Socioeconomic History   Marital status: Married    Spouse name: Madalyne Colosimo   Number of children: 1   Years of education: Not on file   Highest education level: GED or equivalent  Occupational History   Occupation: retired- Technical sales engineer- [employer]  Tobacco Use   Smoking status: Never   Smokeless tobacco: Never  Vaping Use   Vaping Use: Never used  Substance and Sexual Activity   Alcohol use: No    Alcohol/week: 0.0 standard drinks of alcohol   Drug use: No   Sexual activity: Not Currently    Birth control/protection: Surgical  Other Topics Concern   Not on file  Social  History Narrative   Lives in [place], [place] with husband.  Dialysis pt - [facility]wf.   Social Determinants of Health   Financial Resource Strain: Low Risk  (12/14/2018)   Overall Financial Resource Strain (CARDIA)    Difficulty of Paying Living Expenses: Not very hard  Food Insecurity: No Food Insecurity (08/06/2022)   Hunger Vital Sign    Worried About Running Out of Food in the Last Year: Never true    Ran Out of Food in the Last Year: Never true  Transportation Needs: No Transportation Needs (08/06/2022)   PRAPARE - Hydrologist (Medical): No    Lack of Transportation (Non-Medical): No  Physical Activity: Unknown (12/14/2018)   Exercise Vital Sign    Days of Exercise per Week: Patient refused    Minutes of Exercise per Session: Patient refused  Stress: No Stress Concern  Present (12/14/2018)   [place]    Feeling of Stress : Only a little  Social Connections: Unknown (12/14/2018)   Social Connection and Isolation Panel [NHANES]    Frequency of Communication with Friends and Family: Patient refused    Frequency of Social Gatherings with Friends and Family: Patient refused    Attends Religious Services: Patient refused    Active Member of Clubs or Organizations: Patient refused    Attends Archivist Meetings: Patient refused    Marital Status: Patient refused  Intimate Partner Violence: Not At Risk (08/06/2022)   Humiliation, Afraid, Rape, and Kick questionnaire    Fear of Current or Ex-Partner: No    Emotionally Abused: No    Physically Abused: No    Sexually Abused: No    Review of Systems: General: Negative for anorexia, weight loss, fever, chills, fatigue, weakness. Eyes: Negative for vision changes.  ENT: Negative for hoarseness, difficulty swallowing , nasal congestion. CV: Negative for chest pain, angina, palpitations, dyspnea on exertion, peripheral edema.  Respiratory: Negative for dyspnea at rest, dyspnea on exertion, cough, sputum, wheezing.  GI: See history of present illness. GU:  Negative for dysuria, hematuria, urinary incontinence, urinary frequency, nocturnal urination.  MS: Negative for joint pain, low back pain.  Derm: Negative for rash or itching.  Neuro: Negative for weakness, abnormal sensation, seizure, frequent headaches, memory loss, confusion.  Psych: Negative for anxiety, depression Endo: Negative for unusual weight change.  Heme: Negative for bruising or bleeding. Allergy: Negative for rash or hives.  Physical Exam: Vital signs in last 24 hours: Temp:  [97.6 F (36.4 C)-99 F (37.2 C)] 98.3 F (36.8 C) (02/17 0618) Pulse Rate:  [59-123] 64 (02/17 0900) Resp:  [18-25] 18 (02/17 0618) BP: (103-169)/(62-115) 169/68 (02/17 0900) SpO2:  [94 %-100  %] 100 % (02/17 0618) Last BM Date : 08/25/22 General:   Alert,  Well-developed, well-nourished, pleasant and cooperative in NAD Head:  Normocephalic and atraumatic. Eyes:  Sclera clear, no icterus.   Conjunctiva pink. Ears:  Normal auditory acuity. Nose:  No deformity, discharge,  or lesions. Mouth:  No deformity or lesions, dentition normal. Neck:  Supple; no masses or thyromegaly. Lungs:  Clear throughout to auscultation.   No wheezes, crackles, or rhonchi. No acute distress. Heart:  Regular rate and rhythm; no murmurs, clicks, rubs,  or gallops. Abdomen:  Soft, nontender and nondistended. No masses, hepatosplenomegaly or hernias noted. Normal bowel sounds, without guarding, and without rebound.   Msk:  Symmetrical without gross deformities. Normal posture. Pulses:  Normal pulses noted. Extremities:  Without  clubbing or edema. Neurologic:  Alert and  oriented x4;  grossly normal neurologically. Skin:  Intact without significant lesions or rashes. Cervical Nodes:  No significant cervical adenopathy. Psych:  Alert and cooperative. Normal mood and affect.  Intake/Output from previous day: 02/16 0701 - 02/17 0700 In: 1314 [Blood:814; IV Piggyback:500] Out: -  Intake/Output this shift: Total I/O In: 250 [P.O.:250] Out: -   Lab Results: Recent Labs    08/26/22 1410 08/27/22 0718  WBC 6.6 6.2  HGB 6.6* 10.8*  HCT 21.0* 33.2*  PLT 306 277   BMET Recent Labs    08/26/22 1410 08/27/22 0718  NA 135 137  K 3.3* 4.5  CL 99 100  CO2 27 24  GLUCOSE 105* 89  BUN 14 20  CREATININE 3.20* 4.59*  CALCIUM 8.3* 8.5*   LFT Recent Labs    08/26/22 1410  PROT 6.2*  ALBUMIN 3.1*  AST 26  ALT 17  ALKPHOS 85  BILITOT 0.5   PT/INR No results for input(s): "LABPROT", "INR" in the last 72 hours. Hepatitis Panel Recent Labs    08/26/22 1736  HEPBSAG NON REACTIVE   C-Diff No results for input(s): "CDIFFTOX" in the last 72 hours.  Studies/Results: CT Head Wo  Contrast  Result Date: 08/26/2022 CLINICAL DATA:  Delirium. EXAM: CT HEAD WITHOUT CONTRAST TECHNIQUE: Contiguous axial images were obtained from the base of the skull through the vertex without intravenous contrast. RADIATION DOSE REDUCTION: This exam was performed according to the departmental dose-optimization program which includes automated exposure control, adjustment of the mA and/or kV according to patient size and/or use of iterative reconstruction technique. COMPARISON:  08/06/2022 FINDINGS: Brain: There is no evidence for acute hemorrhage, hydrocephalus, mass lesion, or abnormal extra-axial fluid collection. No definite CT evidence for acute infarction. Patchy low attenuation in the deep hemispheric and periventricular white matter is nonspecific, but likely reflects chronic microvascular ischemic demyelination. Vascular: No hyperdense vessel or unexpected calcification. Skull: No evidence for fracture. No worrisome lytic or sclerotic lesion. Sinuses/Orbits: The visualized paranasal sinuses and mastoid air cells are clear. Visualized portions of the globes and intraorbital fat are unremarkable. Other: None. IMPRESSION: 1. No acute intracranial abnormality. 2. Chronic small vessel ischemic disease. Electronically Signed   By: Misty Stanley M.D.   On: 08/26/2022 14:35   DG Chest Portable 1 View  Result Date: 08/26/2022 CLINICAL DATA:  Dyspnea. EXAM: PORTABLE CHEST 1 VIEW COMPARISON:  08/06/2022 FINDINGS: There is a left chest wall dialysis catheter with tips in the right atrium. Previous median sternotomy and CABG procedure. No pleural fluid or airspace disease. Pulmonary vascular congestion noted. IMPRESSION: Pulmonary vascular congestion. Electronically Signed   By: Kerby Moors M.D.   On: 08/26/2022 14:21    Impression: *Acute on chronic blood loss anemia *Heme positive stool *Angiodysplasias of the small bowel *History of colon cancer  Plan: Etiology of patient's worsening anemia  unclear.  Likely angiodysplasias given her history.  No gross melena hematochezia though continues to remain heme positive.  Recent colonoscopy up-to-date, I do not think we need to repeat.  Will proceed with upper endoscopy with VCE placement to further evaluate.    The risks including infection, bleed, or perforation as well as benefits, limitations, alternatives and imponderables have been reviewed with the patient. Potential for esophageal dilation, biopsy, etc. have also been reviewed.  Questions have been answered. All parties agreeable.  Given patient's underlying dementia, her husband was consented for procedure and is agreeable.   Continue monitor H&H and transfuse  for less than 7.  IV Protonix twice daily.  Keep patient NPO.  Consider adding on octreotide pending EGD/capsule results.  Further recommendations to follow.   Elon Alas. Abbey Chatters, D.O. Gastroenterology and Hepatology [facility]    LOS: 1 day     08/27/2022, 10:23 AM

## 2022-08-27 NOTE — Anesthesia Postprocedure Evaluation (Signed)
Anesthesia Post Note  Patient: Shawna [name]  Procedure(s) Performed: ESOPHAGOGASTRODUODENOSCOPY (EGD) WITH PROPOFOL GIVENS CAPSULE STUDY  Patient location during evaluation: PACU Anesthesia Type: General Level of consciousness: awake and alert Pain management: pain level controlled Vital Signs Assessment: post-procedure vital signs reviewed and stable Respiratory status: spontaneous breathing, nonlabored ventilation, respiratory function stable and patient connected to nasal cannula oxygen Cardiovascular status: blood pressure returned to baseline and stable Postop Assessment: no apparent nausea or vomiting Anesthetic complications: no   No notable events documented.   Last Vitals:  Vitals:   08/27/22 0618 08/27/22 0900  BP: 130/62 (!) 169/68  Pulse: (!) 59 64  Resp: 18   Temp: 36.8 C   SpO2: 100%     Last Pain:  Vitals:   08/27/22 1000  TempSrc:   PainSc: 0-No pain                 [name]

## 2022-08-27 NOTE — Progress Notes (Signed)
Shawna [name] (spouse) was called to verify it is fine to do the EGD and capsule study. Double verified with the pacu nurse.

## 2022-08-27 NOTE — Progress Notes (Signed)
Brief nephrology note  Attempted to see the patient today but she is at a procedure.  We are aware of the patient and have arranged for dialysis today mainly from a volume perspective.  She typically gets dialysis Monday Wednesday Friday but only had a short session on Friday.  She has received blood products and therefore undergoing dialysis for volume management.  We will see formally once she is more available

## 2022-08-27 NOTE — Transfer of Care (Signed)
Immediate Anesthesia Transfer of Care Note  Patient: Shawna [name]  Procedure(s) Performed: ESOPHAGOGASTRODUODENOSCOPY (EGD) WITH PROPOFOL GIVENS CAPSULE STUDY  Patient Location: PACU  Anesthesia Type:General  Level of Consciousness: drowsy  Airway & Oxygen Therapy: Patient Spontanous Breathing and Patient connected to nasal cannula oxygen  Post-op Assessment: Report given to RN and Post -op Vital signs reviewed and stable  Post vital signs: Reviewed and stable  Last Vitals:  Vitals Value Taken Time  BP 108/48 08/27/22 1110  Temp 98   Pulse 51 08/27/22 1111  Resp 22 08/27/22 1111  SpO2 94 % 08/27/22 1111  Vitals shown include unvalidated device data.  Last Pain:  Vitals:   08/27/22 1000  TempSrc:   PainSc: 0-No pain         Complications: No notable events documented.

## 2022-08-28 ENCOUNTER — Inpatient Hospital Stay (HOSPITAL_COMMUNITY): Payer: Medicare HMO

## 2022-08-28 DIAGNOSIS — N186 End stage renal disease: Secondary | ICD-10-CM | POA: Diagnosis not present

## 2022-08-28 DIAGNOSIS — D62 Acute posthemorrhagic anemia: Secondary | ICD-10-CM | POA: Diagnosis not present

## 2022-08-28 DIAGNOSIS — R410 Disorientation, unspecified: Secondary | ICD-10-CM | POA: Diagnosis not present

## 2022-08-28 DIAGNOSIS — I480 Paroxysmal atrial fibrillation: Secondary | ICD-10-CM | POA: Diagnosis not present

## 2022-08-28 LAB — CBC
HCT: 34.3 % — ABNORMAL LOW (ref 36.0–46.0)
Hemoglobin: 11.3 g/dL — ABNORMAL LOW (ref 12.0–15.0)
MCH: 33.7 pg (ref 26.0–34.0)
MCHC: 32.9 g/dL (ref 30.0–36.0)
MCV: 102.4 fL — ABNORMAL HIGH (ref 80.0–100.0)
Platelets: 283 10*3/uL (ref 150–400)
RBC: 3.35 MIL/uL — ABNORMAL LOW (ref 3.87–5.11)
RDW: 23.3 % — ABNORMAL HIGH (ref 11.5–15.5)
WBC: 6.6 10*3/uL (ref 4.0–10.5)
nRBC: 0 % (ref 0.0–0.2)

## 2022-08-28 LAB — TYPE AND SCREEN
ABO/RH(D): O POS
Antibody Screen: NEGATIVE
Donor AG Type: NEGATIVE
Donor AG Type: NEGATIVE
Unit division: 0
Unit division: 0

## 2022-08-28 LAB — BPAM RBC
Blood Product Expiration Date: 202403192359
Blood Product Expiration Date: 202403212359
ISSUE DATE / TIME: 202402162156
ISSUE DATE / TIME: 202402170233
Unit Type and Rh: 5100
Unit Type and Rh: 5100

## 2022-08-28 LAB — GLUCOSE, CAPILLARY: Glucose-Capillary: 97 mg/dL (ref 70–99)

## 2022-08-28 MED ORDER — ALPRAZOLAM 0.5 MG PO TABS: 0.5000 mg | ORAL_TABLET | Freq: Every day | ORAL | Status: DC | PRN

## 2022-08-28 MED ORDER — LISINOPRIL 10 MG PO TABS
20.0000 mg | ORAL_TABLET | Freq: Every day | ORAL | Status: DC
  Administered 2022-08-28: 20 mg via ORAL
  Filled 2022-08-28 (×2): qty 2

## 2022-08-28 MED ORDER — HYDROXYZINE HCL 25 MG PO TABS
25.0000 mg | ORAL_TABLET | Freq: Four times a day (QID) | ORAL | Status: DC | PRN
  Administered 2022-08-28: 25 mg via ORAL
  Filled 2022-08-28: qty 1

## 2022-08-28 MED ORDER — NITROGLYCERIN 0.4 MG SL SUBL: 0.4000 mg | SUBLINGUAL_TABLET | SUBLINGUAL | Status: DC | PRN

## 2022-08-28 NOTE — Plan of Care (Signed)
  Problem: Acute Rehab PT Goals(only PT should resolve) Goal: Pt Will Go Supine/Side To Sit Outcome: Progressing Flowsheets (Taken 08/28/2022 1123) Pt will go Supine/Side to Sit: with supervision Goal: Patient Will Transfer Sit To/From Stand Outcome: Progressing Flowsheets (Taken 08/28/2022 1123) Patient will transfer sit to/from stand: with supervision Goal: Pt Will Transfer Bed To Chair/Chair To Bed Outcome: Progressing Flowsheets (Taken 08/28/2022 1123) Pt will Transfer Bed to Chair/Chair to Bed: with supervision Goal: Pt Will Ambulate Outcome: Progressing Flowsheets (Taken 08/28/2022 1123) Pt will Ambulate:  100 feet  with supervision  with rolling walker

## 2022-08-28 NOTE — Op Note (Signed)
[facility] Patient Name: Shawna [name] Procedure Date: 08/27/2022 10:39 AM MRN: [id number] Date of Birth: February 27, 1941 Attending MD: Elon Alas. Abbey Chatters , [place], [id number] CSN: [id number] Age: 82 Admit Type: Inpatient Procedure:                Upper GI endoscopy Indications:              Acute post hemorrhagic anemia, Heme positive stool Providers:                Elon Alas. Abbey Chatters, DO, Lurline Del, RN, Wynonia Musty Tech, Technician Referring MD:              Medicines:                See the Anesthesia note for documentation of the                            administered medications Complications:            No immediate complications. Estimated Blood Loss:     Estimated blood loss: none. Procedure:                Pre-Anesthesia Assessment:                           - The anesthesia plan was to use monitored                            anesthesia care (MAC).                           After obtaining informed consent, the endoscope was                            passed under direct vision. Throughout the                            procedure, the patient's blood pressure, pulse, and                            oxygen saturations were monitored continuously. The                            [id number] scope was introduced through the                            mouth, and advanced to the second part of duodenum.                            The upper GI endoscopy was accomplished without                            difficulty. The patient tolerated the procedure  well. Scope In: 10:51:27 AM Scope Out: 11:04:46 AM Total Procedure Duration: 0 hours 13 minutes 19 seconds  Findings:      The examined esophagus was normal.      A 2 cm hiatal hernia was present.      Multiple small angiodysplastic lesions with no bleeding were found in       the gastric body. Coagulation for bleeding prevention using argon plasma       was successful.       One large angiodysplastic lesion with no bleeding was found in the       gastric body. Coagulation for bleeding prevention using argon plasma was       successful.      The duodenal bulb, first portion of the duodenum and second portion of       the duodenum were normal.      Using the endoscope, the video capsule was advanced into the duodenal       bulb. Video capsule successfully deployed into duodenal bulb. Endoscope       removed and catheter taken out. Upon reinsertion of endoscope, capsule       had refluxed into gastric body. Impression:               - Normal esophagus.                           - 2 cm hiatal hernia.                           - Multiple non-bleeding angiodysplastic lesions in                            the stomach. Treated with argon plasma coagulation                            (APC).                           - One non-bleeding angiodysplastic lesion in the                            stomach. Treated with argon plasma coagulation                            (APC).                           - Normal duodenal bulb, first portion of the                            duodenum and second portion of the duodenum.                           - Successful completion of the Video Capsule                            Enteroscope placement.                           -  No specimens collected. Moderate Sedation:      Per Anesthesia Care Recommendation:           - Return patient to hospital ward for ongoing care.                           - Resume regular diet.                           - Use a proton pump inhibitor PO BID.                           - COntinue to monitor Hgb and transfuse for less                            than 7.                           - Further recommendations after VCE reviewed. Procedure Code(s):        --- Professional ---                           [phone], Esophagogastroduodenoscopy, flexible,                            transoral; with control of  bleeding, any method Diagnosis Code(s):        --- Professional ---                           K44.9, Diaphragmatic hernia without obstruction or                            gangrene                           K31.819, Angiodysplasia of stomach and duodenum                            without bleeding                           D62, Acute posthemorrhagic anemia                           R19.5, Other fecal abnormalities CPT copyright 2022 American Medical Association. All rights reserved. The codes documented in this report are preliminary and upon coder review may  be revised to meet current compliance requirements. Elon Alas. Abbey Chatters, DO [name], DO 08/28/2022 10:17:07 AM This report has been signed electronically. Number of Addenda: 0

## 2022-08-28 NOTE — Progress Notes (Signed)
PROGRESS NOTE   Shawna [name]  [id number] DOB: 08-10-1940 DOA: 08/26/2022 PCP: [facility]   Chief Complaint  Patient presents with   Tachycardia   Level of care: Telemetry  Brief Admission History:   82 y.o. female,with medical history significant of ESRD on MWF HD, CAD s/p CABG, chronic systolic CHF, HLD, HTN, and afib on anticoagulation, patient patient with recent hospitalizationr for GI bleed  s/p EGD and colonoscopy 08/10/2022, duodenal and colonic polyps which were removed. 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 -Patient was sent from HD center for tachycardia, patient had 1 hour left during HD, she was noted to be tachycardic, unclear A-fib or sinus tachycardia, so she was sent to ED for further evaluation, patient is altered and confused, husband reports she had been infused since his recent discharge on 08/17/2022, otherwise no significant complaints, he reports last time she had a BM yesterday, but he is unaware if she had any melena, he denies ever having any nausea, coffee-ground emesis. -In ED she received 500 cc fluid bolus, no further tachycardia, her hemoglobin was noted to be 6.6, goal still pending, chest x-ray with evidence of vascular congestion, was ordered units PRBC transfusion and triage hospitalist consulted to admit.     Assessment and Plan:  Acute on chronic blood loss anemia Symptomatic anemia Anemia of CKD -Patient hemoglobin 6.6, drop for most recent of 8.4 -Hemoccult positive, high concern for GI bleed with hospitalization s/p EGD and colonoscopy 08/10/2022, duodenal and colonic polyps which were removed. 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 -s/p 2 units PRBC -IV iron and Procrit per renal -Discussed with GI, s/p VCE with reading today by GI.  Follow CBC.  Hg up to 11 after transfusion.  Further recommendations from GI pending results of VCE reading.     ESRD -She is on hemodialysis Monday Wednesday Friday, dialyzed today, had 1 hour left from dialysis, but evidence  of vascular congestion on imaging, and positive JVD, she is anuric, received 500 cc of IV fluid in ED and about s/p 2 units PRBC, HD 2/17 by nephrologist, planning for HD on 2/19  -possible DC home 2/19 after HD treatment.    Altered mental status Delirium/Vascular Dementia  -Patient has been more confused over the last few days per husband, CT head with no acute findings.  -delirium precautions    CAD:  -Status post CABG, continue to hold aspirin due to above, continue with metoprolol and statin   HTN:  Pressure stable continue with metoprolol for now, hold lisinopril    A fib;  -Not a candidate for anticoagulation -Continue with amiodarone and metoprolol   Hypothyroidism:  Continue with Synthroid   DM; diet controlled;  With history of diabetes mellitus, her most recent A1c is 5.5 08/07/2022, at this point no indication for any medications   DVT prophylaxis: SCD Code Status: Full  Family Communication: husband bedside    Consultants:  Nephrology GI  Procedures:  VCE  Antimicrobials:     Subjective: Pt tolerated procedures well; she is eating today; confused but not agitated this morning.    Objective: Vitals:   08/27/22 1836 08/27/22 1845 08/27/22 2021 08/28/22 0408  BP:  (!) 165/64 (!) 127/47 (!) 151/62  Pulse:  72 68 65  Resp:  17 20 18  $ Temp:  97.9 F (36.6 C) 98.2 F (36.8 C) 98 F (36.7 C)  TempSrc:  Oral Oral   SpO2:  100% 100% 100%  Weight: 54.2 kg     Height:  Intake/Output Summary (Last 24 hours) at 08/28/2022 1142 Last data filed at 08/28/2022 0900 Gross per 24 hour  Intake 640 ml  Output 3000 ml  Net -2360 ml   Filed Weights   08/27/22 1200 08/27/22 1836  Weight: 57.2 kg 54.2 kg   Examination:  General exam: frail confused female, awake, alert, agitated at times; Appears calm and comfortable  Respiratory system: Clear to auscultation. Respiratory effort normal. Cardiovascular system: irregularly irregular; normal S1 & S2 heard. No  JVD, murmurs, rubs, gallops or clicks. No pedal edema. Gastrointestinal system: Abdomen is nondistended, soft and nontender. No organomegaly or masses felt. Normal bowel sounds heard. Central nervous system: Alert and oriented. No focal neurological deficits. Extremities: Symmetric 5 x 5 power. Skin: No rashes, lesions or ulcers. Psychiatry: Judgement and insight appear poor. Mood & affect agitated.   Data Reviewed: I have personally reviewed following labs and imaging studies  CBC: Recent Labs  Lab 08/26/22 1410 08/27/22 0718 08/28/22 1005  WBC 6.6 6.2 6.6  NEUTROABS 4.6  --   --   HGB 6.6* 10.8* 11.3*  HCT 21.0* 33.2* 34.3*  MCV 115.4* 102.2* 102.4*  PLT 306 277 [id number]    Basic Metabolic Panel: Recent Labs  Lab 08/26/22 1410 08/27/22 0718  NA 135 137  K 3.3* 4.5  CL 99 100  CO2 27 24  GLUCOSE 105* 89  BUN 14 20  CREATININE 3.20* 4.59*  CALCIUM 8.3* 8.5*    CBG: No results for input(s): "GLUCAP" in the last 168 hours.  No results found for this or any previous visit (from the past 240 hour(s)).   Radiology Studies: CT Head Wo Contrast  Result Date: 08/26/2022 CLINICAL DATA:  Delirium. EXAM: CT HEAD WITHOUT CONTRAST TECHNIQUE: Contiguous axial images were obtained from the base of the skull through the vertex without intravenous contrast. RADIATION DOSE REDUCTION: This exam was performed according to the departmental dose-optimization program which includes automated exposure control, adjustment of the mA and/or kV according to patient size and/or use of iterative reconstruction technique. COMPARISON:  08/06/2022 FINDINGS: Brain: There is no evidence for acute hemorrhage, hydrocephalus, mass lesion, or abnormal extra-axial fluid collection. No definite CT evidence for acute infarction. Patchy low attenuation in the deep hemispheric and periventricular white matter is nonspecific, but likely reflects chronic microvascular ischemic demyelination. Vascular: No hyperdense  vessel or unexpected calcification. Skull: No evidence for fracture. No worrisome lytic or sclerotic lesion. Sinuses/Orbits: The visualized paranasal sinuses and mastoid air cells are clear. Visualized portions of the globes and intraorbital fat are unremarkable. Other: None. IMPRESSION: 1. No acute intracranial abnormality. 2. Chronic small vessel ischemic disease. Electronically Signed   By: Misty Stanley M.D.   On: 08/26/2022 14:35   DG Chest Portable 1 View  Result Date: 08/26/2022 CLINICAL DATA:  Dyspnea. EXAM: PORTABLE CHEST 1 VIEW COMPARISON:  08/06/2022 FINDINGS: There is a left chest wall dialysis catheter with tips in the right atrium. Previous median sternotomy and CABG procedure. No pleural fluid or airspace disease. Pulmonary vascular congestion noted. IMPRESSION: Pulmonary vascular congestion. Electronically Signed   By: Kerby Moors M.D.   On: 08/26/2022 14:21    Scheduled Meds:  amiodarone  100 mg Oral Daily   Chlorhexidine Gluconate Cloth  6 each Topical [id number]   folic acid  1 mg Oral Daily   levothyroxine  75 mcg Oral QAC breakfast   metoprolol tartrate  12.5 mg Oral BID   multivitamin  1 tablet Oral Daily   rosuvastatin  10 mg Oral  Daily   sevelamer carbonate  3,200 mg Oral TID WC   Continuous Infusions:   LOS: 2 days   Time spent: 35 mins  Guyla Bless Wynetta Emery, MD How to contact the [facility] Attending or Consulting provider [place] or covering provider during after hours [place], for this patient?  Check the care team in [facility] and look for a) attending/consulting [facility] provider listed and b) the [facility] team listed Log into [email or web address] and use [place]'s universal password to access. If you do not have the password, please contact the hospital operator. Locate the [facility] provider you are looking for under [facility] and page to a number that you can be directly reached. If you still have difficulty reaching the provider, please page the [facility] (Director on Call) for the  Hospitalists listed on amion for assistance.  08/28/2022, 11:42 AM

## 2022-08-28 NOTE — Progress Notes (Signed)
Subjective: Patient without complaints today.  No melena or hematochezia.  Denies any abdominal pain.  Objective: Vital signs in last 24 hours: Temp:  [97 F (36.1 C)-98.2 F (36.8 C)] 98.1 F (36.7 C) (02/18 1330) Pulse Rate:  [64-76] 64 (02/18 1330) Resp:  [14-22] 20 (02/18 1330) BP: (124-182)/(47-89) 143/63 (02/18 1330) SpO2:  [96 %-100 %] 96 % (02/18 1330) Weight:  [54.2 kg] 54.2 kg (02/17 1836) Last BM Date : 08/25/22 General:   Alert and oriented, pleasant Head:  Normocephalic and atraumatic. Eyes:  No icterus, sclera clear. Conjuctiva pink.  Abdomen:  Bowel sounds present, soft, non-tender, non-distended. No HSM or hernias noted. No rebound or guarding. No masses appreciated  Msk:  Symmetrical without gross deformities. Normal posture. Extremities:  Without clubbing or edema. Neurologic:  Alert and  oriented x4;  grossly normal neurologically. Skin:  Warm and dry, intact without significant lesions.  Cervical Nodes:  No significant cervical adenopathy. Psych:  Alert and cooperative. Normal mood and affect.  Intake/Output from previous day: 02/17 0701 - 02/18 0700 In: 790 [P.O.:690; I.V.:100] Out: 3000  Intake/Output this shift: Total I/O In: 400 [P.O.:400] Out: -   Lab Results: Recent Labs    08/26/22 1410 08/27/22 0718 08/28/22 1005  WBC 6.6 6.2 6.6  HGB 6.6* 10.8* 11.3*  HCT 21.0* 33.2* 34.3*  PLT 306 277 283   BMET Recent Labs    08/26/22 1410 08/27/22 0718  NA 135 137  K 3.3* 4.5  CL 99 100  CO2 27 24  GLUCOSE 105* 89  BUN 14 20  CREATININE 3.20* 4.59*  CALCIUM 8.3* 8.5*   LFT Recent Labs    08/26/22 1410  PROT 6.2*  ALBUMIN 3.1*  AST 26  ALT 17  ALKPHOS 85  BILITOT 0.5   PT/INR No results for input(s): "LABPROT", "INR" in the last 72 hours. Hepatitis Panel Recent Labs    08/26/22 1736  HEPBSAG NON REACTIVE     Studies/Results: DG Abd 1 View  Result Date: 08/28/2022 CLINICAL DATA:  Stuffs post capsule endoscopy. EXAM:  ABDOMEN - 1 VIEW COMPARISON:  None Available. FINDINGS: No dilated large or small bowel. Multiple vascular clips and surgical staple lines in the RIGHT pelvis. Round radiodense metallic object in the RIGHT lower quadrant measures 9 mm (image) IMPRESSION: 1. No bowel obstruction. 2. Round metallic density in the RIGHT lower quadrant presumably represents capsule endoscope referenced in history. Electronically Signed   By: Suzy Bouchard M.D.   On: 08/28/2022 12:27    Assessment: *Acute on chronic blood loss anemia *Heme positive stool *Angiodysplasias of the stomach and small bowel *History of colon cancer   Plan: EGD 08/27/2022 multiple gastric AVMs treated with APC, small hiatal hernia, no active or stigmata of bleeding.  VCE placed  VCE reviewed today.  A few small AVMs in the small bowel, likely jejunum.  2 small polyps in the distal small bowel previously noted.  No active or stigmata of bleeding.  Capsule appears to sit at the ileocolonic anastomosis for the last 2 to 3 hours of study.  KUB today shows capsule right lower quadrant.  Previous VCE passed successfully 04/22/2021.  Repeat KUB tomorrow to ensure passage of capsule.  Hemoglobin stable, no further bleeding.  Continue monitor H&H and transfuse for less than 7.  Continue iron supplementation.  May benefit from long term subcutaneous octreotide. Discuss further in GI clinic upon discharge.   Renal diet.  PPI twice daily, okay to switch to oral.  Otherwise supportive  care.     Elon Alas. Abbey Chatters, D.O. Gastroenterology and Hepatology [facility]   LOS: 2 days    08/28/2022, 2:44 PM

## 2022-08-28 NOTE — Consult Note (Signed)
ESRD Consult Note  Requesting provider: Irwin Brakeman Service requesting consult: Hospitalist Reason for consult: ESRD, provision of dialysis Indication for acute dialysis?: End Stage Renal Disease  Outpatient dialysis unit: [facility] Outpatient dialysis prescription: MWF. 3.5hrs. Nipro. 300 BFR. 2k/2,5cal. TDC. Mircera 148mg. EDW 59.5kg. Her left arm access has been thrombosed for 1-2 mths. She is catheter dependent for now  Assessment/Recommendations: Shawna BYNUMis a/an 82y.o. female with a past medical history notable for ESRD on HD admitted with anemia.   # ESRD: Extra HD on 2/17 for volume. Plan for HD again on 2/19 to maintain MWF schedule  # Volume/ hypertension: EDW 59.5kg but currently at 54.2. Continue to challenge as tolerated and adjust EDW outpatient.   # Anemia of Chronic Kidney Disease: With GI bleeding.  Undergoing capsule endoscopy.  Will obtain outpatient records as able and dose ESA as appropriate.  Status posttransfusion on 08/26/2022  # Secondary Hyperparathyroidism/Hyperphosphatemia: On home binders  # Vascular access: Tunneled dialysis catheter dependent  # Failure to thrive: Management per primary    # Additional recommendations: - Dose all meds for creatinine clearance < 10 ml/min  - Unless absolutely necessary, no MRIs with gadolinium.  - Implement save arm precautions.  Prefer needle sticks in the dorsum of the hands or wrists.  No blood pressure measurements in arm. - If blood transfusion is requested during hemodialysis sessions, please alert [facility] to the session.  - Use synthetic opioids (Fentanyl/Dilaudid) if needed  Recommendations were discussed with the primary team.   History of Present Illness: Shawna ROSEBROCKis a/an 82y.o. female with a past medical history of ESRD who presents with Anemia.  Patient presented to the hospital on 2/16.  She was undergoing dialysis on Friday when she had some dizziness and lightheadedness as well as  tachycardia.  She completed most of her dialysis session but had about an hour left.  There was some concern that she may have been in atrial fibrillation and was sent to the emergency department for further evaluation.  It was also noted that the patient was altered and confused.  She was recently discharged on 2/7 and it was noted that she had been confused since leaving the hospital.  It was unclear whether she was having any bloody stools.  In the emergency department she received some IV fluids.  Hemoglobin was noted to be 6.6 and she received transfusion.  She was admitted for further evaluation and plans for capsule endoscopy by GI.  Attempted to see the patient on 2/17 but she was in a procedure.  She did undergo dialysis on 2/17 because of volume excess noted on chest x-ray.  Patient tolerated dialysis with 3 L removed.  Patient states she feels well today without any complaints.  No melena that she is aware of.  Denies shortness of breath, chest pain, nausea, vomiting, diarrhea.   Medications:  Current Facility-Administered Medications  Medication Dose Route Frequency Provider Last Rate Last Admin   albuterol (PROVENTIL) (2.5 MG/3ML) 0.083% nebulizer solution 2.5 mg  2.5 mg Nebulization Q2H PRN Elgergawy, DSilver Huguenin MD       alteplase (CATHFLO ACTIVASE) injection 2 mg  2 mg Intracatheter Once PRN PReesa Chew MD       amiodarone (PACERONE) tablet 100 mg  100 mg Oral Daily Elgergawy, DSilver Huguenin MD   100 mg at 08/28/22 0809   benzonatate (TESSALON) capsule 100 mg  100 mg Oral TID PRN Elgergawy, DSilver Huguenin MD  Chlorhexidine Gluconate Cloth 2 % PADS 6 each  6 each Topical Q0600 Reesa Chew, MD   6 each at [id number]   folic acid (FOLVITE) tablet 1 mg  1 mg Oral Daily Elgergawy, Silver Huguenin, MD   1 mg at 08/28/22 [id number]   heparin injection 1,000 Units  1,000 Units Dialysis PRN Reesa Chew, MD       levothyroxine (SYNTHROID) tablet 75 mcg  75 mcg Oral QAC breakfast Elgergawy,  Silver Huguenin, MD   75 mcg at 08/28/22 0524   lidocaine (PF) (XYLOCAINE) 1 % injection 5 mL  5 mL Intradermal PRN Reesa Chew, MD       lidocaine-prilocaine (EMLA) cream 1 Application  1 Application Topical PRN Reesa Chew, MD       metoprolol tartrate (LOPRESSOR) tablet 12.5 mg  12.5 mg Oral BID Elgergawy, Silver Huguenin, MD   12.5 mg at 08/28/22 [id number]   multivitamin (RENA-VIT) tablet 1 tablet  1 tablet Oral Daily Elgergawy, Silver Huguenin, MD   1 tablet at 08/28/22 0807   ondansetron (ZOFRAN) tablet 4 mg  4 mg Oral Q6H PRN Elgergawy, Silver Huguenin, MD       Or   ondansetron (ZOFRAN) injection 4 mg  4 mg Intravenous Q6H PRN Elgergawy, Silver Huguenin, MD       pentafluoroprop-tetrafluoroeth (GEBAUERS) aerosol 1 Application  1 Application Topical PRN Reesa Chew, MD       rosuvastatin (CRESTOR) tablet 10 mg  10 mg Oral Daily Elgergawy, Silver Huguenin, MD   10 mg at 08/28/22 0809   sevelamer carbonate (RENVELA) tablet 3,200 mg  3,200 mg Oral TID WC Elgergawy, Silver Huguenin, MD   3,200 mg at 08/28/22 0806     ALLERGIES Amlodipine, Aspirin, Nitrofurantoin, Bactrim [sulfamethoxazole-trimethoprim], Contrast media [iodinated contrast media], Iron, Tylenol [acetaminophen], Gabapentin, Iron sucrose, Ranexa [ranolazine], Sucroferric oxyhydroxide, Dexilant [dexlansoprazole], Hydralazine, Levaquin [levofloxacin in d5w], Morphine and related, Pantoprazole, Plavix [clopidogrel bisulfate], Protonix [pantoprazole sodium], and Venofer [ferric oxide]  MEDICAL HISTORY Past Medical History:  Diagnosis Date   Acute on chronic respiratory failure with hypoxia ([place]) 10/10/2016   Anxiety    Arthritis    AVM (arteriovenous malformation) of colon    CAD (coronary artery disease)    a. s/p CABG in 2013 b. DES to D1 in 10/2016. c. cath in 07/2018 showing patent grafts with occlusion of D1 at prior stent site and progression of PDA disease --> medical management recommended   Carotid artery disease ([place])    a. [id number] LICA, [id number]     Chronic bronchitis (HCC)    Chronic HFrEF (heart failure with reduced ejection fraction) ([place])    Colon cancer ([place]) 1992   Esophageal stricture    ESRD on hemodialysis ([place])    ESRD due to HTN, started dialysis 2011 and gets HD at [facility] with Dr Hinda Lenis on MWF schedule.  Access is LUA AVF as of Sept 2014.    GERD (gastroesophageal reflux disease)    High cholesterol 12/2011   History of blood transfusion 07/2011; 12/2011; 01/2012 X 2; 04/2012   History of gout    History of lower GI bleeding    Hypertension    Iron deficiency anemia    Jugular vein occlusion, right (HCC)    Mitral regurgitation    a. Moderate by echo, 02/2012   Mitral valve disease    NSVT (nonsustained ventricular tachycardia) (HCC)    Ovarian cancer ([place]) 1992   PAF (paroxysmal atrial fibrillation) ([place])  Pneumonia ~ 2009   PUD (peptic ulcer disease)    TIA (transient ischemic attack)    Tricuspid valve disease      SOCIAL HISTORY Social History   Socioeconomic History   Marital status: Married    Spouse name: Amairah Koellner   Number of children: 1   Years of education: Not on file   Highest education level: GED or equivalent  Occupational History   Occupation: retired- Technical sales engineer- [employer]  Tobacco Use   Smoking status: Never   Smokeless tobacco: Never  Vaping Use   Vaping Use: Never used  Substance and Sexual Activity   Alcohol use: No    Alcohol/week: 0.0 standard drinks of alcohol   Drug use: No   Sexual activity: Not Currently    Birth control/protection: Surgical  Other Topics Concern   Not on file  Social History Narrative   Lives in [place], [place] with husband.  Dialysis pt - mwf.   Social Determinants of Health   Financial Resource Strain: Low Risk  (12/14/2018)   Overall Financial Resource Strain (CARDIA)    Difficulty of Paying Living Expenses: Not very hard  Food Insecurity: No Food Insecurity (08/06/2022)   Hunger Vital Sign    Worried About Running Out of Food in  the Last Year: Never true    Ran Out of Food in the Last Year: Never true  Transportation Needs: No Transportation Needs (08/06/2022)   PRAPARE - Hydrologist (Medical): No    Lack of Transportation (Non-Medical): No  Physical Activity: Unknown (12/14/2018)   Exercise Vital Sign    Days of Exercise per Week: Patient refused    Minutes of Exercise per Session: Patient refused  Stress: No Stress Concern Present (12/14/2018)   [place]    Feeling of Stress : Only a little  Social Connections: Unknown (12/14/2018)   Social Connection and Isolation Panel [NHANES]    Frequency of Communication with Friends and Family: Patient refused    Frequency of Social Gatherings with Friends and Family: Patient refused    Attends Religious Services: Patient refused    Active Member of Clubs or Organizations: Patient refused    Attends Archivist Meetings: Patient refused    Marital Status: Patient refused  Intimate Partner Violence: Not At Risk (08/06/2022)   Humiliation, Afraid, Rape, and Kick questionnaire    Fear of Current or Ex-Partner: No    Emotionally Abused: No    Physically Abused: No    Sexually Abused: No     FAMILY HISTORY Family History  Problem Relation Age of Onset   Heart disease Mother        Heart Disease before age 10   Hyperlipidemia Mother    Hypertension Mother    Diabetes Mother    Heart attack Mother    Heart disease Father        Heart Disease before age 8   Hyperlipidemia Father    Hypertension Father    Diabetes Father    Diabetes Sister    Hypertension Sister    Diabetes Brother    Hyperlipidemia Brother    Heart attack Brother    Hypertension Sister    Heart attack Brother    Colon cancer Child 46   Other Other        noncontributory for early CAD   Esophageal cancer Neg Hx    Liver disease Neg Hx    Kidney  disease Neg Hx    Colon polyps Neg Hx       Review of Systems: 12 systems were reviewed and negative except per HPI  Physical Exam: Vitals:   08/27/22 2021 08/28/22 0408  BP: (!) 127/47 (!) 151/62  Pulse: 68 65  Resp: 20 18  Temp: 98.2 F (36.8 C) 98 F (36.7 C)  SpO2: 100% 100%   No intake/output data recorded.  Intake/Output Summary (Last 24 hours) at 08/28/2022 [id number] Last data filed at 08/28/2022 0420 Gross per 24 hour  Intake 540 ml  Output 3000 ml  Net -2460 ml   General: well-appearing, no acute distress HEENT: anicteric sclera, MMM CV: normal rate, no murmurs, no edema Lungs: bilateral chest rise, normal wob Abd: soft, non-tender, non-distended Skin: no visible lesions or rashes Psych: alert, engaged, appropriate mood and affect Neuro: normal speech, no gross focal deficits   Test Results Reviewed Lab Results  Component Value Date   NA 137 08/27/2022   K 4.5 08/27/2022   CL 100 08/27/2022   CO2 24 08/27/2022   BUN 20 08/27/2022   CREATININE 4.59 (H) 08/27/2022   CALCIUM 8.5 (L) 08/27/2022   ALBUMIN 3.1 (L) 08/26/2022   PHOS 4.8 (H) 08/08/2022    I have reviewed relevant outside healthcare records

## 2022-08-28 NOTE — Evaluation (Signed)
Physical Therapy Evaluation Patient Details Name: Shawna [name] MRN: [id number] DOB: 04-13-1941 Today's Date: 08/28/2022  History of Present Illness  Shawna [name]  is a 82 y.o. female,with medical history significant of ESRD on MWF HD, CAD s/p CABG, chronic systolic CHF, HLD, HTN, and afib on anticoagulation, patient patient with recent hospitalizationr for GI bleed  s/p EGD and colonoscopy 08/10/2022, duodenal and colonic polyps which were removed. 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  -Patient was sent from HD center for tachycardia, patient had 1 hour left during HD, she was noted to be tachycardic, unclear A-fib or sinus tachycardia, so she was sent to ED for further evaluation, patient is altered and confused, husband reports she had been infused since his recent discharge on 08/17/2022, otherwise no significant complaints, he reports last time she had a BM yesterday, but he is unaware if she had any melena, he denies ever having any nausea, coffee-ground emesis.  -In ED she received 500 cc fluid bolus, no further tachycardia, her hemoglobin was noted to be 6.6, goal still pending, chest x-ray with evidence of vascular congestion, was ordered units PRBC transfusion and triage hospitalist consulted to admit.    Clinical Impression  Patient sitting up in chair (assisted by nursing) on therapist arrival.  Patient does not know where she is and is questioning why she does not have on her normal clothing.  Patient performs sit to stand with max encouragement from therapist with minimal assist to RW; needs cues for hand placement.  Patient ambulates in room with RW and min assist; needs cues to stay in the walker frame and some assist and cues for navigating the walker safely.  Patient returns to sitting with cues to reach back for the chair to control descent. Patient left in chair with chair alarm set and nurse call button in  reach.  Patient will benefit from continued skilled therapy services during the remainder of her  hospital stay and at the next recommended venue of care to address deficits and promote return to optimal function.           Recommendations for follow up therapy are one component of a multi-disciplinary discharge planning process, led by the attending physician.  Recommendations may be updated based on patient status, additional functional criteria and insurance authorization.  Follow Up Recommendations Home health PT Can patient physically be transported by private vehicle: Yes    Assistance Recommended at Discharge Frequent or constant Supervision/Assistance  Patient can return home with the following  A little help with walking and/or transfers;A little help with bathing/dressing/bathroom;Assistance with cooking/housework;Assist for transportation;Help with stairs or ramp for entrance    Equipment Recommendations None recommended by PT  Recommendations for Other Services       Functional Status Assessment Patient has had a recent decline in their functional status and demonstrates the ability to make significant improvements in function in a reasonable and predictable amount of time.     Precautions / Restrictions Precautions Precautions: Fall Restrictions Weight Bearing Restrictions: No      Mobility  Bed Mobility                 Patient Response: Anxious, Restless  Transfers Overall transfer level: Needs assistance Equipment used: Rolling walker (2 wheels) Transfers: Sit to/from Stand Sit to Stand: Min assist   Step pivot transfers: Min assist, +2 safety/equipment       General transfer comment: Min assist for boost, leaning posteriorly today, cues for hand placement to assist while rising  and then cued to reach for RW and lean forward.    Ambulation/Gait Ambulation/Gait assistance: Min assist Gait Distance (Feet): 50 Feet Assistive device: Rolling walker (2 wheels) Gait Pattern/deviations: Step-through pattern, Decreased stride length, Trunk  flexed Gait velocity: decreased gait speed     General Gait Details: Min assist for RW control, proximity to device, and cues for navigating walker; tends to walk behind the walker frame  Stairs            Wheelchair Mobility    Modified Rankin (Stroke Patients Only)       Balance Overall balance assessment: Needs assistance Sitting-balance support: Feet supported, No upper extremity supported Sitting balance-Leahy Scale: Good       Standing balance-Leahy Scale: Fair Standing balance comment: fair standing balance with RW; tends to stand behind the walker frame with trunked flexed                             Pertinent Vitals/Pain Pain Assessment Pain Assessment: No/denies pain    Home Living Family/patient expects to be discharged to:: Private residence Living Arrangements: Spouse/significant other Available Help at Discharge: Family;Available 24 hours/day Type of Home: House Home Access: Stairs to enter Entrance Stairs-Rails: Right Entrance Stairs-Number of Steps: 2   Home Layout: One level Home Equipment: Rollator (4 wheels);Cane - single point;Shower seat;BSC/3in1 Additional Comments: Per patient; patient appears to answer questions appropriately but states she does not know where she is and why she doesn't have her regular clothes on    Prior Function Prior Level of Function : Needs assist       Physical Assist : Mobility (physical);ADLs (physical) Mobility (physical): Bed mobility;Transfers;Gait;Stairs   Mobility Comments: uses a walker or cane sometimes ADLs Comments: husband assists with getting in and out of the tub/shower     Hand Dominance   Dominant Hand: Right    Extremity/Trunk Assessment   Upper Extremity Assessment Upper Extremity Assessment: Generalized weakness    Lower Extremity Assessment Lower Extremity Assessment: Generalized weakness    Cervical / Trunk Assessment Cervical / Trunk Assessment: Normal   Communication   Communication: No difficulties  Cognition Arousal/Alertness: Awake/alert Behavior During Therapy: WFL for tasks assessed/performed, Restless, Agitated Overall Cognitive Status: No family/caregiver present to determine baseline cognitive functioning Area of Impairment: Orientation, Memory, Safety/judgement                 Orientation Level: Disoriented to, Place, Time, Situation           Problem Solving: Decreased initiation, Requires verbal cues, Difficulty sequencing, Requires tactile cues General Comments: takes max encouragement for sit to stand transfer        General Comments      Exercises     Assessment/Plan    PT Assessment Patient needs continued PT services  PT Problem List Decreased strength;Decreased activity tolerance;Decreased balance;Decreased mobility;Decreased coordination;Decreased cognition;Decreased knowledge of use of DME;Decreased safety awareness;Decreased knowledge of precautions       PT Treatment Interventions DME instruction;Gait training;Stair training;Functional mobility training;Therapeutic activities;Therapeutic exercise;Balance training;Neuromuscular re-education;Patient/family education    PT Goals (Current goals can be found in the Care Plan section)  Acute Rehab PT Goals Patient Stated Goal: return home PT Goal Formulation: With patient Time For Goal Achievement: 09/11/22 Potential to Achieve Goals: Fair    Frequency Min 2X/week     Co-evaluation               AM-PAC PT "6 Clicks" Mobility  Outcome Measure Help needed turning from your back to your side while in a flat bed without using bedrails?: A Little Help needed moving from lying on your back to sitting on the side of a flat bed without using bedrails?: A Little Help needed moving to and from a bed to a chair (including a wheelchair)?: A Little Help needed standing up from a chair using your arms (e.g., wheelchair or bedside chair)?: A  Little Help needed to walk in hospital room?: A Little Help needed climbing 3-5 steps with a railing? : A Lot 6 Click Score: 17    End of Session   Activity Tolerance: Patient tolerated treatment well Patient left: in chair;with call bell/phone within reach;with chair alarm set Nurse Communication: Mobility status PT Visit Diagnosis: Unsteadiness on feet (R26.81);Other abnormalities of gait and mobility (R26.89)    Time: 1050-1110 PT Time Calculation (min) (ACUTE ONLY): 20 min   Charges:   PT Evaluation $PT Eval Low Complexity: 1 Low          11:23 AM, 08/28/22 Daylah Sayavong Small Avanni Turnbaugh MPT [facility] [phone] [id number]

## 2022-08-29 ENCOUNTER — Inpatient Hospital Stay (HOSPITAL_COMMUNITY): Payer: Medicare HMO

## 2022-08-29 ENCOUNTER — Telehealth: Payer: Self-pay | Admitting: Gastroenterology

## 2022-08-29 ENCOUNTER — Other Ambulatory Visit: Payer: Self-pay | Admitting: *Deleted

## 2022-08-29 DIAGNOSIS — D500 Iron deficiency anemia secondary to blood loss (chronic): Secondary | ICD-10-CM

## 2022-08-29 DIAGNOSIS — D509 Iron deficiency anemia, unspecified: Secondary | ICD-10-CM

## 2022-08-29 DIAGNOSIS — E039 Hypothyroidism, unspecified: Secondary | ICD-10-CM | POA: Diagnosis not present

## 2022-08-29 DIAGNOSIS — I480 Paroxysmal atrial fibrillation: Secondary | ICD-10-CM | POA: Diagnosis not present

## 2022-08-29 DIAGNOSIS — K5520 Angiodysplasia of colon without hemorrhage: Secondary | ICD-10-CM | POA: Diagnosis not present

## 2022-08-29 LAB — CBC
HCT: 33.7 % — ABNORMAL LOW (ref 36.0–46.0)
Hemoglobin: 10.9 g/dL — ABNORMAL LOW (ref 12.0–15.0)
MCH: 33.3 pg (ref 26.0–34.0)
MCHC: 32.3 g/dL (ref 30.0–36.0)
MCV: 103.1 fL — ABNORMAL HIGH (ref 80.0–100.0)
Platelets: 246 10*3/uL (ref 150–400)
RBC: 3.27 MIL/uL — ABNORMAL LOW (ref 3.87–5.11)
RDW: 22.5 % — ABNORMAL HIGH (ref 11.5–15.5)
WBC: 6.6 10*3/uL (ref 4.0–10.5)
nRBC: 0 % (ref 0.0–0.2)

## 2022-08-29 LAB — RENAL FUNCTION PANEL
Albumin: 3.3 g/dL — ABNORMAL LOW (ref 3.5–5.0)
Anion gap: 12 (ref 5–15)
BUN: 24 mg/dL — ABNORMAL HIGH (ref 8–23)
CO2: 26 mmol/L (ref 22–32)
Calcium: 9 mg/dL (ref 8.9–10.3)
Chloride: 99 mmol/L (ref 98–111)
Creatinine, Ser: 5.21 mg/dL — ABNORMAL HIGH (ref 0.44–1.00)
GFR, Estimated: 8 mL/min — ABNORMAL LOW (ref >60)
Glucose, Bld: 130 mg/dL — ABNORMAL HIGH (ref 70–99)
Phosphorus: 3.1 mg/dL (ref 2.5–4.6)
Potassium: 3.5 mmol/L (ref 3.5–5.1)
Sodium: 137 mmol/L (ref 135–145)

## 2022-08-29 LAB — GLUCOSE, CAPILLARY: Glucose-Capillary: 111 mg/dL — ABNORMAL HIGH (ref 70–99)

## 2022-08-29 MED ORDER — ACETAMINOPHEN 325 MG PO TABS
650.0000 mg | ORAL_TABLET | Freq: Four times a day (QID) | ORAL | Status: DC | PRN
  Administered 2022-08-29: 650 mg via ORAL
  Filled 2022-08-29: qty 2

## 2022-08-29 MED ORDER — HEPARIN SODIUM (PORCINE) 1000 UNIT/ML IJ SOLN
INTRAMUSCULAR | Status: AC
  Administered 2022-08-29: 3800 [IU] via INTRAVENOUS_CENTRAL
  Filled 2022-08-29: qty 6

## 2022-08-29 NOTE — Discharge Summary (Signed)
Physician Discharge Summary  SELVA NOONER [id number] DOB: 06-Mar-1941 DOA: 08/26/2022  PCP: [facility]  Admit date: 08/26/2022 Discharge date: 08/29/2022  Admitted From:  Home  Disposition: Home with [facility]   Recommendations for Outpatient Follow-up:  Follow up with [facility] in 1 weeks for CBC Follow up with GI provider in 2 weeks   Home Health:  PT   Discharge Condition: STABLE   CODE STATUS: FULL DIET: renal/carb modified    Brief Hospitalization Summary: Please see all hospital notes, images, labs for full details of the hospitalization. 82 y.o. female,with medical history significant of ESRD on MWF HD, CAD s/p CABG, chronic systolic CHF, HLD, HTN, and afib on anticoagulation, patient patient with recent hospitalizationr for GI bleed  s/p EGD and colonoscopy 08/10/2022, duodenal and colonic polyps which were removed. 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 -Patient was sent from HD center for tachycardia, patient had 1 hour left during HD, she was noted to be tachycardic, unclear A-fib or sinus tachycardia, so she was sent to ED for further evaluation, patient is altered and confused, husband reports she had been infused since his recent discharge on 08/17/2022, otherwise no significant complaints, he reports last time she had a BM yesterday, but he is unaware if she had any melena, he denies ever having any nausea, coffee-ground emesis. -In ED she received 500 cc fluid bolus, no further tachycardia, her hemoglobin was noted to be 6.6, goal still pending, chest x-ray with evidence of vascular congestion, was ordered units PRBC transfusion and triage hospitalist consulted to admit.     Assessment and Plan:   Acute on chronic blood loss anemia Symptomatic anemia Anemia of CKD -Patient hemoglobin 6.6, drop for most recent of 8.4 -Hemoccult positive, high concern for GI bleed with hospitalization s/p EGD and colonoscopy 08/10/2022, duodenal and colonic polyps which were removed.  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 -s/p 2 units PRBC -IV iron and Procrit per renal -Discussed with GI, s/p VCE with reading today by GI.  Follow CBC.  Hg up to 11 after transfusion.  Further recommendations from GI pending results of VCE reading.   -EGD 08/27/2022 multiple gastric AVMs treated with APC, small hiatal hernia, no active or stigmata of bleeding. VCE placed during EGD and showed few small AVMs in the small bowel, likely jejunum.  2 small polyps in the distal small bowel previously noted.  No active or stigmata of bleeding.  Capsule appears to sit at the ileocolonic anastomosis for the last 2 to 3 hours of study.  -GI team to discuss subQ octreotide outpatient -follow up with [facility] in 1 week for CBC and 2 weeks for provider visit -I asked GI if could resume ASA 81 mg daily and they said yes to resume ASA  ESRD -She is on hemodialysis Monday Wednesday Friday, dialyzed today, had 1 hour left from dialysis, but evidence of vascular congestion on imaging, and positive JVD, she is anuric, received 500 cc of IV fluid in ED and about s/p 2 units PRBC, HD 2/17 by nephrologist, planning for HD on 2/19  -DC home 2/19 after HD treatment.    Altered mental status Delirium/Vascular Dementia  -Patient has been more confused over the last few days per husband, CT head with no acute findings.  -delirium precautions    CAD:  -Status post CABG, continue to hold aspirin due to above, continue with metoprolol and statin   HTN:  Pressure stable continue with metoprolol for now, hold lisinopril    A fib;  -  Not a candidate for anticoagulation -Continue with amiodarone and metoprolol   Hypothyroidism:  Continue with Synthroid   DM; diet controlled;  With history of diabetes mellitus, her most recent A1c is 5.5 08/07/2022, at this point no indication for any medications   Discharge Diagnoses:  Principal Problem:   Iron deficiency anemia due to chronic blood loss Active Problems:   Atherosclerotic heart  disease of native coronary artery without angina pectoris   AVM (arteriovenous malformation) of small bowel, acquired   Chronic combined systolic and diastolic CHF (congestive heart failure) (HCC)   Heme positive stool   ESRD (end stage renal disease) on dialysis (HCC)   PAF (paroxysmal atrial fibrillation) (HCC)   Hypothyroidism (acquired)   Anemia associated with chronic renal failure   Acute on chronic blood loss anemia   Delirium   Angiodysplasia of duodenum   Discharge Instructions:  Allergies as of 08/29/2022       Reactions   Amlodipine Swelling   Aspirin Other (See Comments)   High Doses Mess up her stomach; "makes my bowels have blood in them". Takes 81 mg EC Aspirin    Nitrofurantoin Hives   Bactrim [sulfamethoxazole-trimethoprim] Rash   Contrast Media [iodinated Contrast Media] Itching   Iron Itching, Other (See Comments)   "they gave me iron in dialysis; had to give me Benadryl cause I had to have the iron" (05/02/2012)   Tylenol [acetaminophen] Itching, Other (See Comments)   Makes her feet on fire per pt - can tolerate per pt   Gabapentin Other (See Comments)   Unknown reaction   Iron Sucrose Other (See Comments)   Unknown   Ranexa [ranolazine] Other (See Comments)   Myoclonus-hospitalized    Sucroferric Oxyhydroxide Other (See Comments)   Unknown   Dexilant [dexlansoprazole] Other (See Comments)   Upset stomach   Hydralazine Itching   Has tolerated while inpatient Has tolerated while inpatient   Levaquin [levofloxacin In D5w] Rash   Morphine And Related Itching, Other (See Comments)   Itching in feet   Pantoprazole Rash   Plavix [clopidogrel Bisulfate] Rash   Protonix [pantoprazole Sodium] Rash   Venofer [ferric Oxide] Itching, Other (See Comments)   Patient reports using Benadryl prior to doses as [place]        Medication List     TAKE these medications    ALPRAZolam 0.5 MG tablet Commonly known as: XANAX Take 1 tablet (0.5 mg total)  by mouth daily as needed for anxiety.   amiodarone 100 MG tablet Commonly known as: PACERONE Take 1 tablet (100 mg total) by mouth daily.   Aspirin 81 MG Caps Take 1 capsule by mouth daily at 12 noon.   benzonatate 100 MG capsule Commonly known as: TESSALON Take 100 mg by mouth 3 (three) times daily as needed for cough.   folic acid 1 MG tablet Commonly known as: FOLVITE Take 1 mg by mouth daily.   hydrOXYzine 25 MG tablet Commonly known as: ATARAX Take 25 mg by mouth every 6 (six) hours as needed for itching.   levothyroxine 75 MCG tablet Commonly known as: SYNTHROID Take 1 tablet (75 mcg total) by mouth daily before breakfast.   lisinopril 20 MG tablet Commonly known as: ZESTRIL Take 1 tablet (20 mg total) by mouth daily.   metoprolol tartrate 25 MG tablet Commonly known as: LOPRESSOR Take 0.5 tablets (12.5 mg total) by mouth 2 (two) times daily.   multivitamin Tabs tablet Take 1 tablet by mouth daily.   nitroGLYCERIN  0.4 MG SL tablet Commonly known as: NITROSTAT Place 0.4 mg under the tongue every 5 (five) minutes as needed for chest pain.   omeprazole 20 MG capsule Commonly known as: PRILOSEC Take 2 capsules (40 mg total) by mouth daily as needed (acid reflux). What changed:  how much to take when to take this   ondansetron 4 MG disintegrating tablet Commonly known as: ZOFRAN-ODT Take 4 mg by mouth every 8 (eight) hours as needed for nausea or vomiting.   rosuvastatin 10 MG tablet Commonly known as: CRESTOR Take 1 tablet by mouth once daily   sevelamer carbonate 800 MG tablet Commonly known as: RENVELA Take 3,200 mg by mouth in the morning and at bedtime.        Follow-up Information     [facility]. Schedule an appointment as soon as possible for a visit in 1 week(s).   Why: Hospital Follow Up Contact information: [place] [address] [phone]         [facility] Schedule an appointment as soon as  possible for a visit in 2 week(s).   Why: Hospital Follow Up Contact information: [place] [address] [phone]               Allergies  Allergen Reactions   Amlodipine Swelling   Aspirin Other (See Comments)    High Doses Mess up her stomach; "makes my bowels have blood in them". Takes 81 mg EC Aspirin    Nitrofurantoin Hives   Bactrim [Sulfamethoxazole-Trimethoprim] Rash   Contrast Media [Iodinated Contrast Media] Itching   Iron Itching and Other (See Comments)    "they gave me iron in dialysis; had to give me Benadryl cause I had to have the iron" (05/02/2012)   Tylenol [Acetaminophen] Itching and Other (See Comments)    Makes her feet on fire per pt - can tolerate per pt   Gabapentin Other (See Comments)    Unknown reaction   Iron Sucrose Other (See Comments)    Unknown   Ranexa [Ranolazine] Other (See Comments)    Myoclonus-hospitalized    Sucroferric Oxyhydroxide Other (See Comments)    Unknown   Dexilant [Dexlansoprazole] Other (See Comments)    Upset stomach   Hydralazine Itching    Has tolerated while inpatient Has tolerated while inpatient   Levaquin [Levofloxacin In D5w] Rash   Morphine And Related Itching and Other (See Comments)    Itching in feet   Pantoprazole Rash   Plavix [Clopidogrel Bisulfate] Rash   Protonix [Pantoprazole Sodium] Rash   Venofer [Ferric Oxide] Itching and Other (See Comments)    Patient reports using Benadryl prior to doses as [place] as of 08/29/2022       Reactions   Amlodipine Swelling   Aspirin Other (See Comments)   High Doses Mess up her stomach; "makes my bowels have blood in them". Takes 81 mg EC Aspirin    Nitrofurantoin Hives   Bactrim [sulfamethoxazole-trimethoprim] Rash   Contrast Media [iodinated Contrast Media] Itching   Iron Itching, Other (See Comments)   "they gave me iron in dialysis; had to give me Benadryl cause I had to have the iron" (05/02/2012)    Tylenol [acetaminophen] Itching, Other (See Comments)   Makes her feet on fire per pt - can tolerate per pt   Gabapentin Other (See Comments)   Unknown reaction   Iron Sucrose Other (See Comments)   Unknown   Ranexa [  ranolazine] Other (See Comments)   Myoclonus-hospitalized    Sucroferric Oxyhydroxide Other (See Comments)   Unknown   Dexilant [dexlansoprazole] Other (See Comments)   Upset stomach   Hydralazine Itching   Has tolerated while inpatient Has tolerated while inpatient   Levaquin [levofloxacin In D5w] Rash   Morphine And Related Itching, Other (See Comments)   Itching in feet   Pantoprazole Rash   Plavix [clopidogrel Bisulfate] Rash   Protonix [pantoprazole Sodium] Rash   Venofer [ferric Oxide] Itching, Other (See Comments)   Patient reports using Benadryl prior to doses as [place]        Medication List     TAKE these medications    ALPRAZolam 0.5 MG tablet Commonly known as: XANAX Take 1 tablet (0.5 mg total) by mouth daily as needed for anxiety.   amiodarone 100 MG tablet Commonly known as: PACERONE Take 1 tablet (100 mg total) by mouth daily.   Aspirin 81 MG Caps Take 1 capsule by mouth daily at 12 noon.   benzonatate 100 MG capsule Commonly known as: TESSALON Take 100 mg by mouth 3 (three) times daily as needed for cough.   folic acid 1 MG tablet Commonly known as: FOLVITE Take 1 mg by mouth daily.   hydrOXYzine 25 MG tablet Commonly known as: ATARAX Take 25 mg by mouth every 6 (six) hours as needed for itching.   levothyroxine 75 MCG tablet Commonly known as: SYNTHROID Take 1 tablet (75 mcg total) by mouth daily before breakfast.   lisinopril 20 MG tablet Commonly known as: ZESTRIL Take 1 tablet (20 mg total) by mouth daily.   metoprolol tartrate 25 MG tablet Commonly known as: LOPRESSOR Take 0.5 tablets (12.5 mg total) by mouth 2 (two) times daily.   multivitamin Tabs tablet Take 1 tablet by mouth daily.   nitroGLYCERIN 0.4  MG SL tablet Commonly known as: NITROSTAT Place 0.4 mg under the tongue every 5 (five) minutes as needed for chest pain.   omeprazole 20 MG capsule Commonly known as: PRILOSEC Take 2 capsules (40 mg total) by mouth daily as needed (acid reflux). What changed:  how much to take when to take this   ondansetron 4 MG disintegrating tablet Commonly known as: ZOFRAN-ODT Take 4 mg by mouth every 8 (eight) hours as needed for nausea or vomiting.   rosuvastatin 10 MG tablet Commonly known as: CRESTOR Take 1 tablet by mouth once daily   sevelamer carbonate 800 MG tablet Commonly known as: RENVELA Take 3,200 mg by mouth in the morning and at bedtime.        Procedures/Studies: DG Abd 1 View  Result Date: 08/28/2022 CLINICAL DATA:  Stuffs post capsule endoscopy. EXAM: ABDOMEN - 1 VIEW COMPARISON:  None Available. FINDINGS: No dilated large or small bowel. Multiple vascular clips and surgical staple lines in the RIGHT pelvis. Round radiodense metallic object in the RIGHT lower quadrant measures 9 mm (image) IMPRESSION: 1. No bowel obstruction. 2. Round metallic density in the RIGHT lower quadrant presumably represents capsule endoscope referenced in history. Electronically Signed   By: Suzy Bouchard M.D.   On: 08/28/2022 12:27   CT Head Wo Contrast  Result Date: 08/26/2022 CLINICAL DATA:  Delirium. EXAM: CT HEAD WITHOUT CONTRAST TECHNIQUE: Contiguous axial images were obtained from the base of the skull through the vertex without intravenous contrast. RADIATION DOSE REDUCTION: This exam was performed according to the departmental dose-optimization program which includes automated exposure control, adjustment of the mA and/or kV according to  patient size and/or use of iterative reconstruction technique. COMPARISON:  08/06/2022 FINDINGS: Brain: There is no evidence for acute hemorrhage, hydrocephalus, mass lesion, or abnormal extra-axial fluid collection. No definite CT evidence for acute  infarction. Patchy low attenuation in the deep hemispheric and periventricular white matter is nonspecific, but likely reflects chronic microvascular ischemic demyelination. Vascular: No hyperdense vessel or unexpected calcification. Skull: No evidence for fracture. No worrisome lytic or sclerotic lesion. Sinuses/Orbits: The visualized paranasal sinuses and mastoid air cells are clear. Visualized portions of the globes and intraorbital fat are unremarkable. Other: None. IMPRESSION: 1. No acute intracranial abnormality. 2. Chronic small vessel ischemic disease. Electronically Signed   By: Misty Stanley M.D.   On: 08/26/2022 14:35   DG Chest Portable 1 View  Result Date: 08/26/2022 CLINICAL DATA:  Dyspnea. EXAM: PORTABLE CHEST 1 VIEW COMPARISON:  08/06/2022 FINDINGS: There is a left chest wall dialysis catheter with tips in the right atrium. Previous median sternotomy and CABG procedure. No pleural fluid or airspace disease. Pulmonary vascular congestion noted. IMPRESSION: Pulmonary vascular congestion. Electronically Signed   By: Kerby Moors M.D.   On: 08/26/2022 14:21   CT HEAD WO CONTRAST (5MM)  Result Date: 08/06/2022 CLINICAL DATA:  Confusion, missed hemodialysis EXAM: CT HEAD WITHOUT CONTRAST TECHNIQUE: Contiguous axial images were obtained from the base of the skull through the vertex without intravenous contrast. RADIATION DOSE REDUCTION: This exam was performed according to the departmental dose-optimization program which includes automated exposure control, adjustment of the mA and/or kV according to patient size and/or use of iterative reconstruction technique. COMPARISON:  07/29/2022 FINDINGS: Brain: No evidence of acute infarction, hemorrhage, hydrocephalus, extra-axial collection or mass lesion/mass effect. Extensive subcortical white matter and periventricular small vessel ischemic changes. Global cortical atrophy. Vascular: Intracranial atherosclerosis. Skull: Normal. Negative for fracture  or focal lesion. Sinuses/Orbits: The visualized paranasal sinuses are essentially clear. The mastoid air cells are unopacified. Other: None. IMPRESSION: No evidence of acute intracranial abnormality. Atrophy with small vessel ischemic changes. Electronically Signed   By: Julian Hy M.D.   On: 08/06/2022 02:38   DG Chest 2 View  Result Date: 08/06/2022 CLINICAL DATA:  Shortness of breath and confusion. Missed dialysis yesterday EXAM: CHEST - 2 VIEW COMPARISON:  07/14/2022 FINDINGS: Left IJ dialysis catheter tip in the right atrium. Sternotomy and CABG. Coronary stenting. Left axillary vascular stent. Stable cardiomediastinal silhouette. Aortic atherosclerotic calcification. Bibasilar atelectasis or infiltrates. Small bilateral pleural effusions. No pneumothorax. IMPRESSION: Bibasilar atelectasis or infiltrates. Cardiomegaly and small bilateral pleural effusions. Electronically Signed   By: Placido Sou M.D.   On: 08/06/2022 02:02     Subjective: Pt awake, alert, feels better, no complaints.   Discharge Exam: Vitals:   08/29/22 1345 08/29/22 1400  BP: (!) 182/79 (!) 136/46  Pulse:    Resp: 14 20  Temp:    SpO2:     Vitals:   08/29/22 1320 08/29/22 1330 08/29/22 1345 08/29/22 1400  BP: (!) 157/67 (!) 168/77 (!) 182/79 (!) 136/46  Pulse:      Resp: 19 19 14 20  $ Temp:      TempSrc:      SpO2:      Weight:      Height:       General: Pt is alert, awake, not in acute distress Cardiovascular: normal S1/S2 +, no rubs, no gallops Respiratory: CTA bilaterally, no wheezing, no rhonchi Abdominal: Soft, NT, ND, bowel sounds + Extremities: no edema, no cyanosis   The results of significant diagnostics from  this hospitalization (including imaging, microbiology, ancillary and laboratory) are listed below for reference.     Microbiology: No results found for this or any previous visit (from the past 240 hour(s)).   Labs: BNP (last 3 results) Recent Labs    11/08/21 1800  08/06/22 0139 08/26/22 1410  BNP 1,391.8* 1,666.2* [id number]*   Basic Metabolic Panel: Recent Labs  Lab 08/26/22 1410 08/27/22 0718 08/29/22 0458  NA 135 137 137  K 3.3* 4.5 3.5  CL 99 100 99  CO2 27 24 26  $ GLUCOSE 105* 89 130*  BUN 14 20 24*  CREATININE 3.20* 4.59* 5.21*  CALCIUM 8.3* 8.5* 9.0  PHOS  --   --  3.1   Liver Function Tests: Recent Labs  Lab 08/26/22 1410 08/29/22 0458  AST 26  --   ALT 17  --   ALKPHOS 85  --   BILITOT 0.5  --   PROT 6.2*  --   ALBUMIN 3.1* 3.3*   No results for input(s): "LIPASE", "AMYLASE" in the last 168 hours. Recent Labs  Lab 08/26/22 1447  AMMONIA 17   CBC: Recent Labs  Lab 08/26/22 1410 08/27/22 0718 08/28/22 1005 08/29/22 0458  WBC 6.6 6.2 6.6 6.6  NEUTROABS 4.6  --   --   --   HGB 6.6* 10.8* 11.3* 10.9*  HCT 21.0* 33.2* 34.3* 33.7*  MCV 115.4* 102.2* 102.4* 103.1*  PLT 306 277 283 246   Cardiac Enzymes: No results for input(s): "CKTOTAL", "CKMB", "CKMBINDEX", "TROPONINI" in the last 168 hours. BNP: Invalid input(s): "POCBNP" CBG: Recent Labs  Lab 08/27/22 1017 08/28/22 2056  GLUCAP 111* 97   D-Dimer No results for input(s): "DDIMER" in the last 72 hours. Hgb A1c No results for input(s): "HGBA1C" in the last 72 hours. Lipid Profile No results for input(s): "CHOL", "HDL", "LDLCALC", "TRIG", "CHOLHDL", "LDLDIRECT" in the last 72 hours. Thyroid function studies Recent Labs    08/26/22 1410  TSH 4.745*   Anemia work up No results for input(s): "VITAMINB12", "FOLATE", "FERRITIN", "TIBC", "IRON", "RETICCTPCT" in the last 72 hours. Urinalysis    Component Value Date/Time   COLORURINE YELLOW 12/16/2012 1919   APPEARANCEUR CLOUDY (A) 12/16/2012 1919   LABSPEC 1.009 12/16/2012 1919   PHURINE 7.5 12/16/2012 1919   GLUCOSEU NEGATIVE 12/16/2012 1919   HGBUR TRACE (A) 12/16/2012 1919   BILIRUBINUR NEGATIVE 12/16/2012 1919   KETONESUR NEGATIVE 12/16/2012 1919   PROTEINUR 100 (A) 12/16/2012 1919    UROBILINOGEN 0.2 12/16/2012 1919   NITRITE NEGATIVE 12/16/2012 1919   LEUKOCYTESUR SMALL (A) 12/16/2012 1919   Sepsis Labs Recent Labs  Lab 08/26/22 1410 08/27/22 0718 08/28/22 1005 08/29/22 0458  WBC 6.6 6.2 6.6 6.6   Microbiology No results found for this or any previous visit (from the past 240 hour(s)).  Time coordinating discharge: 42 mins  SIGNED:  Irwin Brakeman, MD  [facility] 08/29/2022, 2:10 PM How to contact the [facility] Attending or Consulting provider [place] or covering provider during after hours [place], for this patient?  Check the care team in [facility] and look for a) attending/consulting [facility] provider listed and b) the [facility] team listed Log into [email or web address] and use [place]'s universal password to access. If you do not have the password, please contact the hospital operator. Locate the [facility] provider you are looking for under [facility] and page to a number that you can be directly reached. If you still have difficulty reaching the provider, please page the [facility] (Director on Call)  for the Hospitalists listed on amion for assistance.

## 2022-08-29 NOTE — Telephone Encounter (Signed)
Labs entered into Epic  °

## 2022-08-29 NOTE — Care Management Important Message (Signed)
Important Message  Patient Details  Name: Shawna [name] MRN: [id number] Date of Birth: 1941-01-09   Medicare Important Message Given:  Yes     [name] 08/29/2022, 11:32 AM

## 2022-08-29 NOTE — Telephone Encounter (Signed)
Noted. We will need to try to follow-up with them in a couple of days to ensure the orders get to the correct facility.

## 2022-08-29 NOTE — Telephone Encounter (Signed)
Spoke to pt's husband, he states she is going to nursing home, but not sure which one.

## 2022-08-29 NOTE — Progress Notes (Signed)
Subjective: Feeling well this morning. Had a BM last night. No brbpr or melena. Didn't look to see if capsule passed. Has aslight abdominal pain this morning that passed quickly. Tolerated breakfast well. No nausea or vomiting.   Ready to go home.   Objective: Vital signs in last 24 hours: Temp:  [97.7 F (36.5 C)-98.2 F (36.8 C)] 97.7 F (36.5 C) (02/19 0348) Pulse Rate:  [64-66] 65 (02/19 0348) Resp:  [18-20] 18 (02/19 0348) BP: (143-182)/(63-93) 182/77 (02/19 0348) SpO2:  [96 %-100 %] 100 % (02/19 0348) Last BM Date : 08/25/22 General:   Alert and oriented, pleasant, NAD.  Head:  Normocephalic and atraumatic. Eyes:  No icterus, sclera clear. Conjuctiva pink.  Abdomen:  Bowel sounds present, soft, non-tender, non-distended. No HSM or hernias noted. No rebound or guarding. No masses appreciated  Msk:  Symmetrical without gross deformities. Normal posture. Extremities:  Without edema. Psych:  Normal mood and affect.  Intake/Output from previous day: 02/18 0701 - 02/19 0700 In: 640 [P.O.:640] Out: -  Intake/Output this shift: No intake/output data recorded.  Lab Results: Recent Labs    08/27/22 0718 08/28/22 1005 08/29/22 0458  WBC 6.2 6.6 6.6  HGB 10.8* 11.3* 10.9*  HCT 33.2* 34.3* 33.7*  PLT 277 283 246   BMET Recent Labs    08/26/22 1410 08/27/22 0718 08/29/22 0458  NA 135 137 137  K 3.3* 4.5 3.5  CL 99 100 99  CO2 27 24 26  $ GLUCOSE 105* 89 130*  BUN 14 20 24*  CREATININE 3.20* 4.59* 5.21*  CALCIUM 8.3* 8.5* 9.0   LFT Recent Labs    08/26/22 1410 08/29/22 0458  PROT 6.2*  --   ALBUMIN 3.1* 3.3*  AST 26  --   ALT 17  --   ALKPHOS 85  --   BILITOT 0.5  --    PT/INR No results for input(s): "LABPROT", "INR" in the last 72 hours. Hepatitis Panel Recent Labs    08/26/22 1736  HEPBSAG NON REACTIVE   Studies/Results: DG Abd 1 View  Result Date: 08/28/2022 CLINICAL DATA:  Stuffs post capsule endoscopy. EXAM: ABDOMEN - 1 VIEW COMPARISON:   None Available. FINDINGS: No dilated large or small bowel. Multiple vascular clips and surgical staple lines in the RIGHT pelvis. Round radiodense metallic object in the RIGHT lower quadrant measures 9 mm (image) IMPRESSION: 1. No bowel obstruction. 2. Round metallic density in the RIGHT lower quadrant presumably represents capsule endoscope referenced in history. Electronically Signed   By: Suzy Bouchard M.D.   On: 08/28/2022 12:27    Assessment: 82 y.o. female with a past medical history of dementia, ESRD on hemodialysis, atrial fibrillation, only on ASA, CAD, heart failure, carotid artery disease, small bowel AVMs, recurrent GI bleeding, colon cancer status post resection 1992, iron deficiency anemia, duodenal adenoma, who presented to [facility] due to worsening anemia, GI consulted for further evaluation.   Acute on chronic anemia:  Hemoglobin 6.6 on admission, down from 8 range. Recent admission at [facility] for similar presentation and underwent EGD and colonoscopy 08/11/2022.  1 small bowel AVM ablated, small duodenal polyp removed (duodenal adenoma), colonoscopy with healthy-appearing anastomosis x 2, 6 polyps removed in the transverse descending sigmoid colons, tubular adenomas on pathology. This admission, EGD 08/27/2022 multiple gastric AVMs treated with APC, small hiatal hernia, no active or stigmata of bleeding. VCE placed during EGD and showed few small AVMs in the small bowel, likely jejunum.  2 small polyps in  the distal small bowel previously noted.  No active or stigmata of bleeding.  Capsule appears to sit at the ileocolonic anastomosis for the last 2 to 3 hours of study. KUB yesterday showed capsule in RLQ. Previous VCE passed successfully 04/22/2021. She did have a BM last night, but didn't pay attention if capsule passed. She has received 2 units PRBCs this admission and hemoglobin is stable at 10.9 today. No overt GI bleeding.  Plan: Recommend repeating KUB today. If  capsule still present, can repeat in 1 week.  Can discuss subcutaneous octreotide outpatient.  Continue to monitor H&H and for overt GI bleeding.  Transfuse for hemoglobin less than 7. Continue iron supplementation.   Continue PPI twice daily. From a GI standpoint, would be okay to go home today. We will arrange for follow-up in the clinic.     LOS: 3 days    08/29/2022, 8:29 AM   Aliene Altes, [facility]

## 2022-08-29 NOTE — Progress Notes (Signed)
Mobility Specialist Progress Note:    08/29/22 0959  Mobility  Activity Ambulated with assistance in hallway  Level of Assistance Contact guard assist, steadying assist  Distance Ambulated (ft) 210 ft  Activity Response Tolerated well  Mobility Referral Yes  $Mobility charge 1 Mobility   Pt was agreeable to mobility session.Tolerated well. MinG for safety. Pt c/o weakness in knees near EOS. Returned pt back to room, sitting up in chair. Chair alarm on, call bell in reach, all needs met.   Royetta Crochet Mobility Specialist Please contact via Solicitor or  Rehab office at [phone]

## 2022-08-29 NOTE — Progress Notes (Signed)
Patient ID: JAMIEANN WASSENAAR, female   DOB: [birth year], 82 y.o.   MRN: [id number]  [facility] Progress Note    Subjective:    Feeling better today and asking if she can go home   Objective:   BP (!) 164/77 (BP Location: Right Arm)   Pulse 70   Temp 98.7 F (37.1 C) (Oral)   Resp 18   Ht 5' 2"$  (1.575 m)   Wt 54.2 kg   SpO2 99%   BMI 21.85 kg/m   Intake/Output: I/O last 3 completed shifts: In: 1080 [P.O.:1080] Out: -    Intake/Output this shift:  No intake/output data recorded. Weight change:   Physical Exam: Gen: NAD CVS: RRR Resp: CTA Abd: +BS, soft, NT/ND Ext: no edema  Labs: BMET Recent Labs  Lab 08/26/22 1410 08/27/22 0718 08/29/22 0458  NA 135 137 137  K 3.3* 4.5 3.5  CL 99 100 99  CO2 27 24 26  $ GLUCOSE 105* 89 130*  BUN 14 20 24*  CREATININE 3.20* 4.59* 5.21*  ALBUMIN 3.1*  --  3.3*  CALCIUM 8.3* 8.5* 9.0  PHOS  --   --  3.1   CBC Recent Labs  Lab 08/26/22 1410 08/27/22 0718 08/28/22 1005 08/29/22 0458  WBC 6.6 6.2 6.6 6.6  NEUTROABS 4.6  --   --   --   HGB 6.6* 10.8* 11.3* 10.9*  HCT 21.0* 33.2* 34.3* 33.7*  MCV 115.4* 102.2* 102.4* 103.1*  PLT 306 277 283 246      Medications:     amiodarone  100 mg Oral Daily   Chlorhexidine Gluconate Cloth  6 each Topical 99991111   folic acid  1 mg Oral Daily   levothyroxine  75 mcg Oral QAC breakfast   lisinopril  20 mg Oral Daily   metoprolol tartrate  12.5 mg Oral BID   multivitamin  1 tablet Oral Daily   rosuvastatin  10 mg Oral Daily   sevelamer carbonate  3,200 mg Oral TID WC   Outpatient dialysis unit: [facility] Outpatient dialysis prescription: MWF. 3.5hrs. Nipro. 300 BFR. 2k/2,5cal. TDC. Mircera 139mg. EDW 59.5kg. Her left arm access has been thrombosed for 1-2 mths. She is catheter dependent for now  Assessment/ Plan:   Acute on chronic Anemia - Hgb dropped to 6.6 s/p blood transfusion and now up to 10.9.  Hemoccult +.  GI following.  H/o duodenal AVM and duodenal and  colonic polyps by EGD/Colonoscopy 08/10/22. ESRD:  s/p extra HD over the weekend.  Plan for HD today. Anemia:as above CKD-MBD: continue with home meds Nutrition: renal diet Hypertension: stable Disposition - for possible discharge after HD today.  JDonetta Potts MD [place]2/19/2024, 9:03 AM

## 2022-08-29 NOTE — Progress Notes (Signed)
HEMODIALYSIS TREATMENT NOTE:  3.5 hour heparin-free treatment completed using LIJ TDC.  Goal not met.  UF removal was limited due to labile blood pressure.  UF was paused once for 15 minutes and goal was adjusted numerous times.  Asymptomatic trigeminy noted for 15 minutes - resolved with lowering of BFR.  Net UF 2 liters.  All blood was returned.   POST-DIALYSIS:  08/29/22 1530  Vital Signs  Temp 98.4 F (36.9 C)  Temp Source Oral  Pulse Rate 67  Pulse Rate Source Monitor  Resp 18  BP 135/77  BP Location Right Arm  BP Method Automatic  Patient Position (if appropriate) Lying  Oxygen Therapy  SpO2 100 %  O2 Device Room Air  Dialysis Weight  Weight 53.4 kg  Type of Weight Post-Dialysis  Post Treatment  Dialyzer Clearance Lightly streaked  Duration of HD Treatment -hour(s) 3.5 hour(s)  Hemodialysis Intake (mL) 0 mL  Fluid Removed (mL) 2000 mL  Tolerated HD Treatment No (Comment)  Post-Hemodialysis Comments See PN   Report given to Radene Gunning, LPN   Rockwell [place], RN AP KDU

## 2022-08-29 NOTE — TOC Initial Note (Signed)
Transition of Care [facility]) - Initial/Assessment Note    Patient Details  Name: Shawna [name] MRN: [id number] Date of Birth: [birth year]  Transition of Care [facility]) CM/SW Contact:    Ihor Gully, LCSW Phone Number: 08/29/2022, 1:29 PM  Clinical Narrative:                 Patient admitted from [facility] and rehab where she has been a rehab resident since 2/7. Allison at the facility indicated that on the day of patient's admission to the hospital the insurance company had indicated they were no longer paying. Patient ambulated 247f with mobility specialist today and yesterday PT recommended HHPT. Explained to spouse and patient separately. [facility] orders requested from attending.   Expected Discharge Plan: HFredericBarriers to Discharge: No Barriers Identified   Patient Goals and CMS Choice            Expected Discharge Plan and Services         Expected Discharge Date: 08/29/22                         HH Arranged: PT HH Agency: [place] 08/29/22 Time HH Agency Contacted: 129Representative spoke with at [place] SD[place]hi[place]angements/Services     Patient language and need for interpreter reviewed:: Yes Do you feel safe going back to the place where you live?: Yes      Need for Family Participation in Patient Care: Yes (Comment) Care giver support system in place?: Yes (comment) Current home services: DME Criminal Activity/Legal Involvement Pertinent to Current Situation/Hospitalization: No - Comment as needed  Activities of Daily Living      Permission Sought/Granted Permission sought to share information with : Family Supports    Share Information with NAME: spouse, Mr. MNorrington          Emotional Assessment Appearance:: Appears younger than stated age   Affect (typically observed): Appropriate Orientation: :  Oriented to Self, Oriented to Place, Oriented to  Time, Oriented to Situation Alcohol / Substance Use: Not Applicable Psych Involvement: No (comment)  Admission diagnosis:  Iron deficiency anemia due to chronic blood loss [D50.0] Acute on chronic blood loss anemia [D62] Patient Active Problem List   Diagnosis Date Noted   Iron deficiency anemia due to chronic blood loss 08/26/2022   Benign neoplasm of transverse colon 08/11/2022   Benign neoplasm of descending colon 08/11/2022   Benign neoplasm of sigmoid colon 08/11/2022   Angiodysplasia of duodenum 08/11/2022   Delirium 04/22/2022   History of CAD (coronary artery disease) 04/22/2022   NSTEMI, initial episode of care ([place] 10/19/2021   Acute on chronic blood loss anemia 09/09/2021   Ischemic cardiomyopathy    Paroxysmal Atrial fibrillation/Flutter 05/07/2021   Neuropathy 04/23/2021   Anemia associated with chronic renal failure 04/13/2021   Left knee pain 03/15/2021   Moderate protein-calorie malnutrition ([place] 02/27/2021   COVID-19 virus infection 02/03/2021   Leukocytosis 02/03/2021   Hypothyroidism (acquired) 02/03/2021   Myositis 12/03/2020   Ataxia 12/02/2020   Diabetes mellitus type 2 in nonobese ([place]own[place] 11/10/2020   Failure to thrive in adult 11/09/2020   Dialysis AV fistula malfunction, initial encounter (HO[place]    Jugular vein occlusion, right (HCC)    Failure of surgically constructed arteriovenous fistula ([place] 10/03/2020   Myoclonus 08/31/2020   Clotted renal dialysis  AV graft, initial encounter ([place])    Hemodialysis-associated hypotension    Irritable bowel syndrome 02/25/2020   Adenomatous duodenal polyp 09/10/2019   History of GI bleed 09/10/2019   Hand steal syndrome ([place]) 08/01/2017   Coronary artery disease [id number]   Complication of vascular access for dialysis 03/19/2017   Preoperative clearance 01/25/2017   H/O non-ST elevation myocardial infarction (NSTEMI) 10/24/2016   Non-ST elevation (NSTEMI)  myocardial infarction [facility])    Heme positive stool    Cardiac arrest [facility])    Palliative care encounter    Goals of care, counseling/discussion    Flash pulmonary edema ([place]) 04/06/2016   History of colon cancer 01/27/2016   History of ovarian cancer 01/27/2016   PAF (paroxysmal atrial fibrillation) ([place]) 10/14/2015   Malignant neoplasm of right ovary ([place]) 10/14/2015   SVT (supraventricular tachycardia) 09/08/2015   Dyspnea    Occlusion and stenosis of carotid artery without mention of cerebral infarction 01/24/2013   Hx of CABG 07/05/2012   Carotid artery disease ([place]) 07/05/2012   Mitral regurgitation 06/12/2012   Non-STEMI (non-ST elevated myocardial infarction) ([place]) 06/08/2012   Chronic combined systolic and diastolic CHF (congestive heart failure) ([place]) 05/02/2012   AVM (arteriovenous malformation) of small bowel, acquired 01/20/2012   GERD (gastroesophageal reflux disease) 01/09/2012   HLD (hyperlipidemia) 01/05/2012   Atherosclerotic heart disease of native coronary artery without angina pectoris 12/16/2011   Anxiety disorder 05/04/2011   ESRD (end stage renal disease) on dialysis ([place]) 04/29/2011   Gout 04/29/2011   PCP:  Practice, [place]:   [facility] [address] [place] [address] Phone: [phone] Fax: [phone]  [facility] (ESRD Bundle Only) - [place], [address] [place] [address] Phone: [phone] Fax: [phone]  [facility] - [address] [place], [place] - [address] [facility] [address] [facility] [address] [place] [address] Phone: [phone] Fax: [phone]     Social Determinants of Health (SDOH) Social History: [place]: No Food Insecurity (08/06/2022)  Housing: Low Risk  (08/06/2022)  Transportation Needs: No Transportation Needs (08/06/2022)  Utilities: Not At Risk (08/06/2022)  Financial Resource Strain: Low Risk  (12/14/2018)   Physical Activity: Unknown (12/14/2018)  Social Connections: Unknown (12/14/2018)  Stress: No Stress Concern Present (12/14/2018)  Tobacco Use: Low Risk  (08/11/2022)   SDOH Interventions:     Readmission Risk Interventions    11/12/2021    8:56 AM 10/20/2021    4:38 PM 07/23/2021    4:26 PM  Readmission Risk Prevention Plan  Transportation Screening Complete Complete Complete  Medication Review (RN Care Manager) Complete Complete Complete  PCP or Specialist appointment within 3-5 days of discharge Complete Complete Complete  HRI or Home Care Consult Complete Complete Complete  SW Recovery Care/Counseling Consult Complete Complete Complete  Palliative Care Screening Not Applicable Not Applicable Not Applicable  Skilled Nursing Facility Complete Not Applicable Not Applicable

## 2022-08-29 NOTE — Discharge Instructions (Signed)
IMPORTANT INFORMATION: PAY CLOSE ATTENTION   PHYSICIAN DISCHARGE INSTRUCTIONS  Follow with Primary care provider  Practice, [facility]  and other consultants as instructed by your Hospitalist Physician  [place] IF SYMPTOMS COME BACK, WORSEN OR NEW PROBLEM DEVELOPS   Please note: You were cared for by a hospitalist during your hospital stay. Every effort will be made to forward records to your primary care provider.  You can request that your primary care provider send for your hospital records if they have not received them.  Once you are discharged, your primary care physician will handle any further medical issues. Please note that NO REFILLS for any discharge medications will be authorized once you are discharged, as it is imperative that you return to your primary care physician (or establish a relationship with a primary care physician if you do not have one) for your post hospital discharge needs so that they can reassess your need for medications and monitor your lab values.  Please get a complete blood count and chemistry panel checked by your Primary MD at your next visit, and again as instructed by your Primary MD.  Get Medicines reviewed and adjusted: Please take all your medications with you for your next visit with your Primary MD  Laboratory/radiological data: Please request your Primary MD to go over all hospital tests and procedure/radiological results at the follow up, please ask your primary care provider to get all Hospital records sent to his/her office.  In some cases, they will be blood work, cultures and biopsy results pending at the time of your discharge. Please request that your primary care provider follow up on these results.  If you are diabetic, please bring your blood sugar readings with you to your follow up appointment with primary care.    Please call and make your follow up appointments as soon as possible.     Also Note the following: If you experience worsening of your admission symptoms, develop shortness of breath, life threatening emergency, suicidal or homicidal thoughts you must seek medical attention immediately by calling 911 or calling your MD immediately  if symptoms less severe.  You must read complete instructions/literature along with all the possible adverse reactions/side effects for all the Medicines you take and that have been prescribed to you. Take any new Medicines after you have completely understood and accpet all the possible adverse reactions/side effects.   Do not drive when taking Pain medications or sleeping medications (Benzodiazepines)  Do not take more than prescribed Pain, Sleep and Anxiety Medications. It is not advisable to combine anxiety,sleep and pain medications without talking with your primary care practitioner  Special Instructions: If you have smoked or chewed Tobacco  in the last 2 yrs please stop smoking, stop any regular Alcohol  and or any Recreational drug use.  Wear Seat belts while driving.  Do not drive if taking any narcotic, mind altering or controlled substances or recreational drugs or alcohol.

## 2022-08-29 NOTE — Progress Notes (Signed)
Patient discharged home with instructions given on medications and follow up visits,patient and family verbalized understanding.IV discontinued,catheter intact. Accompanied by staff to an awaiting vehicle.

## 2022-08-29 NOTE — Telephone Encounter (Signed)
Patient likely getting discharged today. She needs hospital follow-up in 2 weeks. Can be with any APP or Dr. Jenetta Downer. Please arrange.   Courtney: Please arrange CBC in 1 week. Dx: IDA.

## 2022-08-31 ENCOUNTER — Encounter (HOSPITAL_COMMUNITY): Payer: Self-pay | Admitting: Internal Medicine

## 2022-09-02 ENCOUNTER — Other Ambulatory Visit: Payer: Self-pay | Admitting: *Deleted

## 2022-09-02 DIAGNOSIS — D509 Iron deficiency anemia, unspecified: Secondary | ICD-10-CM

## 2022-09-02 NOTE — Telephone Encounter (Signed)
Spoke to pt and her husband. Informed them she needed blood work done. Pt voiced understanding. Pt is at home now and would like blood work done when she goes to dialysis. I informed her I will mail lab requisitions and she could ask if they will draw her blood and fax results to office.

## 2022-09-02 NOTE — Telephone Encounter (Signed)
Noted  

## 2022-09-13 ENCOUNTER — Inpatient Hospital Stay: Payer: Medicare HMO | Admitting: Gastroenterology

## 2022-09-13 NOTE — Telephone Encounter (Signed)
Shawna [name], can you find out if patient had CBC completed at dialysis? If so, we need results.

## 2022-09-14 NOTE — Telephone Encounter (Signed)
Noted  

## 2022-09-14 NOTE — Telephone Encounter (Signed)
Spoke to nurse at dialysis and they said they would try and get the CBC. They send there results to [place], so I will try and call back in a couple day to check results.

## 2022-09-22 ENCOUNTER — Telehealth (INDEPENDENT_AMBULATORY_CARE_PROVIDER_SITE_OTHER): Payer: Self-pay

## 2022-09-22 NOTE — Telephone Encounter (Signed)
Courtney, can you follow-up on this?  

## 2022-09-22 NOTE — Telephone Encounter (Signed)
Received labs from [facility]. Placed in you box.

## 2022-09-22 NOTE — Telephone Encounter (Signed)
Spoke to nurse at [facility], she will fax recent labs.

## 2022-09-22 NOTE — Telephone Encounter (Signed)
Reviewed labs.  Hemoglobin on 09/19/2022 was 10.6.  This is stable since her hospital discharge.  Patient no-showed to her hospital follow-up with Venetia Night, NP.  We need to get this rescheduled.  Mandy: Please reschedule patient's hospital follow-up.

## 2022-09-22 NOTE — Telephone Encounter (Signed)
Spoke with the patient and spouse and the patient is scheduled with Dr. Delana Meyer on 10/04/22 with a  11:00 am arrival time to [facility] for a left arm venogram and fistulagram with possible intervention. Pre-procedure instructions were discussed and will be mailed.

## 2022-09-23 NOTE — Telephone Encounter (Signed)
Noted  

## 2022-10-02 ENCOUNTER — Other Ambulatory Visit: Payer: Self-pay

## 2022-10-02 ENCOUNTER — Emergency Department (HOSPITAL_COMMUNITY): Payer: Medicare HMO

## 2022-10-02 ENCOUNTER — Emergency Department (HOSPITAL_COMMUNITY)
Admission: EM | Admit: 2022-10-02 | Discharge: 2022-10-02 | Disposition: A | Discharge status: Home / Self Care | Payer: Medicare HMO | Attending: Emergency Medicine | Admitting: Emergency Medicine

## 2022-10-02 DIAGNOSIS — I700 Atherosclerosis of aorta: Secondary | ICD-10-CM | POA: Insufficient documentation

## 2022-10-02 DIAGNOSIS — I509 Heart failure, unspecified: Secondary | ICD-10-CM | POA: Insufficient documentation

## 2022-10-02 DIAGNOSIS — Z7982 Long term (current) use of aspirin: Secondary | ICD-10-CM | POA: Diagnosis not present

## 2022-10-02 DIAGNOSIS — N186 End stage renal disease: Secondary | ICD-10-CM | POA: Insufficient documentation

## 2022-10-02 DIAGNOSIS — Z1152 Encounter for screening for COVID-19: Secondary | ICD-10-CM | POA: Diagnosis not present

## 2022-10-02 DIAGNOSIS — Z992 Dependence on renal dialysis: Secondary | ICD-10-CM | POA: Diagnosis not present

## 2022-10-02 DIAGNOSIS — Z8673 Personal history of transient ischemic attack (TIA), and cerebral infarction without residual deficits: Secondary | ICD-10-CM | POA: Insufficient documentation

## 2022-10-02 DIAGNOSIS — I132 Hypertensive heart and chronic kidney disease with heart failure and with stage 5 chronic kidney disease, or end stage renal disease: Secondary | ICD-10-CM | POA: Insufficient documentation

## 2022-10-02 DIAGNOSIS — J40 Bronchitis, not specified as acute or chronic: Secondary | ICD-10-CM | POA: Insufficient documentation

## 2022-10-02 DIAGNOSIS — J111 Influenza due to unidentified influenza virus with other respiratory manifestations: Secondary | ICD-10-CM

## 2022-10-02 DIAGNOSIS — R059 Cough, unspecified: Secondary | ICD-10-CM | POA: Diagnosis present

## 2022-10-02 LAB — BASIC METABOLIC PANEL
Anion gap: 13 (ref 5–15)
BUN: 42 mg/dL — ABNORMAL HIGH (ref 8–23)
CO2: 24 mmol/L (ref 22–32)
Calcium: 8.5 mg/dL — ABNORMAL LOW (ref 8.9–10.3)
Chloride: 98 mmol/L (ref 98–111)
Creatinine, Ser: 7.1 mg/dL — ABNORMAL HIGH (ref 0.44–1.00)
GFR, Estimated: 5 mL/min — ABNORMAL LOW (ref >60)
Glucose, Bld: 118 mg/dL — ABNORMAL HIGH (ref 70–99)
Potassium: 3.9 mmol/L (ref 3.5–5.1)
Sodium: 135 mmol/L (ref 135–145)

## 2022-10-02 LAB — CBC
HCT: 27.9 % — ABNORMAL LOW (ref 36.0–46.0)
Hemoglobin: 9 g/dL — ABNORMAL LOW (ref 12.0–15.0)
MCH: 36.3 pg — ABNORMAL HIGH (ref 26.0–34.0)
MCHC: 32.3 g/dL (ref 30.0–36.0)
MCV: 112.5 fL — ABNORMAL HIGH (ref 80.0–100.0)
Platelets: 239 10*3/uL (ref 150–400)
RBC: 2.48 MIL/uL — ABNORMAL LOW (ref 3.87–5.11)
RDW: 23.1 % — ABNORMAL HIGH (ref 11.5–15.5)
WBC: 7.4 10*3/uL (ref 4.0–10.5)
nRBC: 0 % (ref 0.0–0.2)

## 2022-10-02 LAB — RESP PANEL BY RT-PCR (RSV, FLU A&B, COVID)  RVPGX2
Influenza A by PCR: NEGATIVE
Influenza B by PCR: NEGATIVE
Resp Syncytial Virus by PCR: NEGATIVE
SARS Coronavirus 2 by RT PCR: NEGATIVE

## 2022-10-02 MED ORDER — AZITHROMYCIN 250 MG PO TABS: 250.0000 mg | ORAL_TABLET | Freq: Every day | ORAL | 0 refills | Status: DC

## 2022-10-02 NOTE — ED Provider Notes (Signed)
[facility] Provider Note   CSN: [id number] Arrival date & time: 10/02/22  1800     History  Chief Complaint  Patient presents with   Cough    Shawna [name] is a 82 y.o. female.  Patient is a dialysis patient normally dialyzed Monday Wednesdays and Fridays.  Was dialyzed on Friday.  She has a temporary catheter in the left anterior chest.  Started with a cough 2 days ago she is worried about pneumonia.  She did cough very hard 1 time and it did make her vomit.  But no persistent vomiting.  She is on [employer] which is helping the cough some but not making it go away completely.  No known sick contacts.  Temp here 98.9 blood pressure 171/78 pulse 67 past medical history significant for chronic bronchitis peptic ulcer disease gastroesophageal reflux disease coronary disease high cholesterol history of TIAs mitral regurg hypertension end-stage renal disease on dialysis history of atrial fibrillation chronic heart failure mitral valve disease tricuspid valve disease.  Past surgical history for abdominal hysterectomy appendectomy and coronary angioplasties with stent in 2013 and repeat left heart cath in 2020.  Patient last seen in the emergency department February 16 for tachycardia iron deficiency anemia due to chronic blood loss.  Was admitted at that time.       Home Medications Prior to Admission medications   Medication Sig Start Date End Date Taking? Authorizing Provider  azithromycin (ZITHROMAX) 250 MG tablet Take 1 tablet (250 mg total) by mouth daily. Take first 2 tablets together, then 1 every day until finished. 10/02/22  Yes Fredia Sorrow, MD  ALPRAZolam Duanne Moron) 0.5 MG tablet Take 1 tablet (0.5 mg total) by mouth daily as needed for anxiety. 08/17/22   Donne Hazel, MD  amiodarone (PACERONE) 100 MG tablet Take 1 tablet (100 mg total) by mouth daily. 04/27/22   Nita Sells, MD  Aspirin 81 MG CAPS Take 1 capsule by mouth  daily at 12 noon.    [provider]  benzonatate (TESSALON) 100 MG capsule Take 100 mg by mouth 3 (three) times daily as needed for cough. 05/10/22   [provider]  folic acid (FOLVITE) 1 MG tablet Take 1 mg by mouth daily. 05/24/22   [provider]  hydrOXYzine (ATARAX) 25 MG tablet Take 25 mg by mouth every 6 (six) hours as needed for itching. 06/06/22   [provider]  levothyroxine (SYNTHROID) 75 MCG tablet Take 1 tablet (75 mcg total) by mouth daily before breakfast. 08/14/22 09/13/22  Darliss Cheney, MD  lisinopril (ZESTRIL) 20 MG tablet Take 1 tablet (20 mg total) by mouth daily. 08/18/22 09/17/22  Donne Hazel, MD  metoprolol tartrate (LOPRESSOR) 25 MG tablet Take 0.5 tablets (12.5 mg total) by mouth 2 (two) times daily. 03/04/22   Arnoldo Lenis, MD  multivitamin (RENA-VIT) TABS tablet Take 1 tablet by mouth daily.    [provider]  nitroGLYCERIN (NITROSTAT) 0.4 MG SL tablet Place 0.4 mg under the tongue every 5 (five) minutes as needed for chest pain.    [provider]  omeprazole (PRILOSEC) 20 MG capsule Take 2 capsules (40 mg total) by mouth daily as needed (acid reflux). Patient taking differently: Take 20 mg by mouth daily. 06/12/22   Aline August, MD  ondansetron (ZOFRAN-ODT) 4 MG disintegrating tablet Take 4 mg by mouth every 8 (eight) hours as needed for nausea or vomiting. 05/05/22   [provider]  rosuvastatin (CRESTOR) 10 MG tablet Take 1 tablet by mouth once daily 06/06/22   Ahmed Prima, [place] M, PA-C  sevelamer carbonate (RENVELA) 800 MG tablet Take 3,200 mg by mouth in the morning and at bedtime. 12/27/21   [provider]      Allergies    Amlodipine, Aspirin, Nitrofurantoin, Bactrim [sulfamethoxazole-trimethoprim], Contrast media [iodinated contrast media], Iron, Tylenol [acetaminophen], Gabapentin, Iron sucrose, Ranexa [ranolazine], Sucroferric oxyhydroxide, Dexilant [dexlansoprazole],  Hydralazine, Levaquin [levofloxacin in d5w], Morphine and related, Pantoprazole, Plavix [clopidogrel bisulfate], Protonix [pantoprazole sodium], and Venofer [ferric oxide]    Review of Systems   Review of Systems  Constitutional:  Negative for chills and fever.  HENT:  Positive for congestion. Negative for ear pain and sore throat.   Eyes:  Negative for pain and visual disturbance.  Respiratory:  Positive for cough. Negative for shortness of breath.   Cardiovascular:  Negative for chest pain and palpitations.  Gastrointestinal:  Negative for abdominal pain and vomiting.  Genitourinary:  Negative for dysuria and hematuria.  Musculoskeletal:  Negative for arthralgias and back pain.  Skin:  Negative for color change and rash.  Neurological:  Negative for seizures and syncope.  All other systems reviewed and are negative.   Physical Exam Updated Vital Signs BP (!) 195/85   Pulse 69   Temp 98.9 F (37.2 C) (Oral)   Resp 18   Ht 1.575 m (5\' 2" )   Wt 54.4 kg   SpO2 96%   BMI 21.95 kg/m  Physical Exam Vitals and nursing note reviewed.  Constitutional:      General: She is not in acute distress.    Appearance: Normal appearance. She is well-developed. She is not ill-appearing.  HENT:     Head: Normocephalic and atraumatic.  Eyes:     Conjunctiva/sclera: Conjunctivae normal.  Cardiovascular:     Rate and Rhythm: Normal rate and regular rhythm.     Heart sounds: No murmur heard. Pulmonary:     Effort: Pulmonary effort is normal. No respiratory distress.     Breath sounds: Normal breath sounds. No wheezing, rhonchi or rales.     Comments: Dialysis catheter left anterior chest. Abdominal:     Palpations: Abdomen is soft.     Tenderness: There is no abdominal tenderness.  Musculoskeletal:        General: No swelling.     Cervical back: Neck supple.  Skin:    General: Skin is warm and dry.     Capillary Refill: Capillary refill takes less than 2 seconds.  Neurological:      General: No focal deficit present.     Mental Status: She is alert and oriented to person, place, and time.  Psychiatric:        Mood and Affect: Mood normal.     ED Results / Procedures / Treatments   Labs (all labs ordered are listed, but only abnormal results are displayed) Labs Reviewed  BASIC METABOLIC PANEL - Abnormal; Notable for the following components:      Result Value   Glucose, Bld 118 (*)    BUN 42 (*)    Creatinine, Ser 7.10 (*)    Calcium 8.5 (*)    GFR, Estimated 5 (*)    All other components within normal limits  CBC - Abnormal; Notable for the following components:   RBC 2.48 (*)    Hemoglobin 9.0 (*)    HCT 27.9 (*)    MCV 112.5 (*)    MCH 36.3 (*)  RDW 23.1 (*)    All other components within normal limits  RESP PANEL BY RT-PCR (RSV, FLU A&B, COVID)  [id number]    EKG None  Radiology DG Chest 2 View  Result Date: 10/02/2022 CLINICAL DATA:  cough EXAM: CHEST - 2 VIEW COMPARISON:  Chest x-ray 08/26/2022, CT chest 07/16/2021 FINDINGS: Left chest wall dialysis catheter with tip overlying the right atrium. The heart and mediastinal contours are unchanged. Atherosclerotic plaque. Coronary artery calcification. No focal consolidation. Coarsened interstitial markings consistent with known bronchitic changes. No pleural effusion. No pneumothorax. No acute osseous abnormality.  Sternotomy wires are intact. IMPRESSION: 1. Bronchitic changes. 2.  Aortic Atherosclerosis (ICD10-I70.0). Electronically Signed   By: Iven Finn M.D.   On: 10/02/2022 19:08    Procedures Procedures    Medications Ordered in ED Medications - No data to display  ED Course/ Medical Decision Making/ A&P                             Medical Decision Making Amount and/or Complexity of Data Reviewed Labs: ordered. Radiology: ordered.  Risk Prescription drug management.   Patient nontoxic not febrile oxygen saturation on room air 94%.  Chest x-ray negative for pneumonia  suggestive of bronchitis.  Respiratory panel negative for COVID flu and RSV.  Patient metabolic panel potassium 3.9 otherwise suggestive of a dialysis patient.  CBC no leukocytosis hemoglobin 9 platelets 239.  Patient will be treated with a Z-Pak.  She has dialysis scheduled for tomorrow.  Patient will return for any new or worse symptoms.   Final Clinical Impression(s) / ED Diagnoses Final diagnoses:  Bronchitis  End stage renal disease on dialysis [facility])  Influenza-like illness    Rx / DC Orders ED Discharge Orders          Ordered    azithromycin (ZITHROMAX) 250 MG tablet  Daily        10/02/22 2046              Fredia Sorrow, MD 10/02/22 2106

## 2022-10-02 NOTE — ED Triage Notes (Signed)
Pt presents with a cough and nasal drainage that started last night. Denies ShOB. No sick contacts.

## 2022-10-02 NOTE — Discharge Instructions (Addendum)
Chest x-ray negative for pneumonia.  Respiratory panel negative for COVID flu or RSV.  Make sure you follow-up for dialysis as scheduled for tomorrow.  Take the antibiotic Zithromycin as directed.  Return for any new or worse symptoms.  Make an appointment follow-up with your regular doctor.

## 2022-10-04 ENCOUNTER — Encounter: Payer: Self-pay | Admitting: Vascular Surgery

## 2022-10-04 ENCOUNTER — Encounter
Admission: RE | Disposition: A | Discharge status: Home / Self Care | Payer: Self-pay | Source: Home / Self Care | Attending: Vascular Surgery

## 2022-10-04 ENCOUNTER — Ambulatory Visit
Admission: RE | Admit: 2022-10-04 | Discharge: 2022-10-04 | Disposition: A | Discharge status: Home / Self Care | Payer: Medicare HMO | Attending: Vascular Surgery | Admitting: Vascular Surgery

## 2022-10-04 ENCOUNTER — Other Ambulatory Visit: Payer: Self-pay

## 2022-10-04 DIAGNOSIS — T82868A Thrombosis of vascular prosthetic devices, implants and grafts, initial encounter: Secondary | ICD-10-CM | POA: Diagnosis not present

## 2022-10-04 DIAGNOSIS — I5022 Chronic systolic (congestive) heart failure: Secondary | ICD-10-CM | POA: Insufficient documentation

## 2022-10-04 DIAGNOSIS — I871 Compression of vein: Secondary | ICD-10-CM | POA: Diagnosis not present

## 2022-10-04 DIAGNOSIS — Y841 Kidney dialysis as the cause of abnormal reaction of the patient, or of later complication, without mention of misadventure at the time of the procedure: Secondary | ICD-10-CM | POA: Insufficient documentation

## 2022-10-04 DIAGNOSIS — N186 End stage renal disease: Secondary | ICD-10-CM

## 2022-10-04 DIAGNOSIS — Z992 Dependence on renal dialysis: Secondary | ICD-10-CM | POA: Diagnosis not present

## 2022-10-04 DIAGNOSIS — I132 Hypertensive heart and chronic kidney disease with heart failure and with stage 5 chronic kidney disease, or end stage renal disease: Secondary | ICD-10-CM | POA: Diagnosis not present

## 2022-10-04 DIAGNOSIS — E785 Hyperlipidemia, unspecified: Secondary | ICD-10-CM | POA: Insufficient documentation

## 2022-10-04 HISTORY — PX: UPPER EXTREMITY VENOGRAPHY: CATH118272

## 2022-10-04 HISTORY — PX: A/V FISTULAGRAM: CATH118298

## 2022-10-04 LAB — POTASSIUM (ARMC VASCULAR LAB ONLY): Potassium (ARMC vascular lab): 4.7 mmol/L (ref 3.5–5.1)

## 2022-10-04 SURGERY — UPPER EXTREMITY VENOGRAPHY
Anesthesia: Moderate Sedation | Laterality: Left

## 2022-10-04 MED ORDER — HYDROMORPHONE HCL 1 MG/ML IJ SOLN: 1.0000 mg | Freq: Once | INTRAMUSCULAR | Status: DC | PRN

## 2022-10-04 MED ORDER — METHYLPREDNISOLONE SODIUM SUCC 125 MG IJ SOLR
INTRAMUSCULAR | Status: AC
  Administered 2022-10-04: 125 mg via INTRAVENOUS
  Filled 2022-10-04: qty 2

## 2022-10-04 MED ORDER — ONDANSETRON HCL 4 MG/2ML IJ SOLN: 4.0000 mg | Freq: Four times a day (QID) | INTRAMUSCULAR | Status: DC | PRN

## 2022-10-04 MED ORDER — CEFAZOLIN SODIUM-DEXTROSE 1-4 GM/50ML-% IV SOLN
INTRAVENOUS | Status: AC
  Filled 2022-10-04: qty 50

## 2022-10-04 MED ORDER — FENTANYL CITRATE (PF) 100 MCG/2ML IJ SOLN
INTRAMUSCULAR | Status: AC
  Filled 2022-10-04: qty 2

## 2022-10-04 MED ORDER — SODIUM CHLORIDE 0.9 % IV SOLN: INTRAVENOUS | Status: DC

## 2022-10-04 MED ORDER — DIPHENHYDRAMINE HCL 50 MG/ML IJ SOLN
INTRAMUSCULAR | Status: AC
  Administered 2022-10-04: 50 mg via INTRAVENOUS
  Filled 2022-10-04: qty 1

## 2022-10-04 MED ORDER — CEFAZOLIN SODIUM-DEXTROSE 1-4 GM/50ML-% IV SOLN
INTRAVENOUS | Status: DC | PRN
  Administered 2022-10-04: 1 g via INTRAVENOUS

## 2022-10-04 MED ORDER — MIDAZOLAM HCL 2 MG/ML PO SYRP: 8.0000 mg | ORAL_SOLUTION | Freq: Once | ORAL | Status: DC | PRN

## 2022-10-04 MED ORDER — IODIXANOL 320 MG/ML IV SOLN
INTRAVENOUS | Status: DC | PRN
  Administered 2022-10-04: 10 mL

## 2022-10-04 MED ORDER — FENTANYL CITRATE (PF) 100 MCG/2ML IJ SOLN
INTRAMUSCULAR | Status: DC | PRN
  Administered 2022-10-04: 25 ug via INTRAVENOUS

## 2022-10-04 MED ORDER — FAMOTIDINE 20 MG PO TABS: 40.0000 mg | ORAL_TABLET | Freq: Once | ORAL | Status: AC | PRN

## 2022-10-04 MED ORDER — DIPHENHYDRAMINE HCL 50 MG/ML IJ SOLN: 50.0000 mg | Freq: Once | INTRAMUSCULAR | Status: AC | PRN

## 2022-10-04 MED ORDER — HEPARIN SODIUM (PORCINE) 1000 UNIT/ML IJ SOLN
INTRAMUSCULAR | Status: AC
  Filled 2022-10-04: qty 10

## 2022-10-04 MED ORDER — MIDAZOLAM HCL 5 MG/5ML IJ SOLN
INTRAMUSCULAR | Status: AC
  Filled 2022-10-04: qty 5

## 2022-10-04 MED ORDER — FAMOTIDINE 20 MG PO TABS
ORAL_TABLET | ORAL | Status: AC
  Administered 2022-10-04: 40 mg via ORAL
  Filled 2022-10-04: qty 2

## 2022-10-04 MED ORDER — METHYLPREDNISOLONE SODIUM SUCC 125 MG IJ SOLR: 125.0000 mg | Freq: Once | INTRAMUSCULAR | Status: AC | PRN

## 2022-10-04 MED ORDER — CEFAZOLIN SODIUM-DEXTROSE 1-4 GM/50ML-% IV SOLN: 1.0000 g | INTRAVENOUS | Status: DC

## 2022-10-04 MED ORDER — MIDAZOLAM HCL 2 MG/2ML IJ SOLN
INTRAMUSCULAR | Status: DC | PRN
  Administered 2022-10-04: 1 mg via INTRAVENOUS

## 2022-10-04 SURGICAL SUPPLY — 14 items
CATH ANGIO 5F PIGTAIL 65CM (CATHETERS) IMPLANT
COVER PROBE ULTRASOUND 5X96 (MISCELLANEOUS) IMPLANT
GOWN STRL REUS W/ TWL LRG LVL3 (GOWN DISPOSABLE) ×1 IMPLANT
GOWN STRL REUS W/TWL LRG LVL3 (GOWN DISPOSABLE) ×1
NDL ENTRY 21GA 7CM ECHOTIP (NEEDLE) IMPLANT
NEEDLE ENTRY 21GA 7CM ECHOTIP (NEEDLE) ×1 IMPLANT
PACK ANGIOGRAPHY (CUSTOM PROCEDURE TRAY) ×1 IMPLANT
SET INTRO CAPELLA COAXIAL (SET/KITS/TRAYS/PACK) IMPLANT
SHEATH BRITE TIP 5FRX11 (SHEATH) IMPLANT
SHEATH BRITE TIP 6FRX5.5 (SHEATH) IMPLANT
SUT MNCRL AB 4-0 PS2 18 (SUTURE) IMPLANT
SYR MEDRAD MARK 7 150ML (SYRINGE) IMPLANT
TUBING CONTRAST HIGH PRESS 72 (TUBING) IMPLANT
WIRE EMERALD 3MM-J .035X150CM (WIRE) IMPLANT

## 2022-10-04 NOTE — H&P (View-Only) (Signed)
MRN : [id number]  Shawna [name] is a 82 y.o. (11-03-1940) female who presents with chief complaint of check access.  History of Present Illness:   The patient presents to [facility] to plan for dialysis access.  Back in January she presented to the office at which time noninvasive studies showed an occluded brachial axillary AV graft. Based upon the patient's history I suspect this has been ongoing since prior to June 09, 2022. On that date the patient went to the [facility] emergency department due to a bleeding AV fistula and she had a stitch placed to help stop the bleeding. Following that she was unable to use her access. She notes that around that time she had a PermCath placed in the left chest. Since that time she has been utilizing that and has been working without any significant issue. The patient also underwent vein mapping of the right upper extremity but she has heavily calcified arteries with some significant disease in the brachial artery.   Current Meds  Medication Sig   amiodarone (PACERONE) 100 MG tablet Take 1 tablet (100 mg total) by mouth daily.   Aspirin 81 MG CAPS Take 1 capsule by mouth daily at 12 noon.   benzonatate (TESSALON) 100 MG capsule Take 100 mg by mouth 3 (three) times daily as needed for cough.   folic acid (FOLVITE) 1 MG tablet Take 1 mg by mouth daily.   hydrOXYzine (ATARAX) 25 MG tablet Take 25 mg by mouth every 6 (six) hours as needed for itching.   levothyroxine (SYNTHROID) 75 MCG tablet Take 1 tablet (75 mcg total) by mouth daily before breakfast.   lisinopril (ZESTRIL) 20 MG tablet Take 1 tablet (20 mg total) by mouth daily.   metoprolol tartrate (LOPRESSOR) 25 MG tablet Take 0.5 tablets (12.5 mg total) by mouth 2 (two) times daily.   multivitamin (RENA-VIT) TABS tablet Take 1 tablet by mouth daily.   sevelamer carbonate (RENVELA) 800 MG tablet Take 3,200 mg by mouth in the morning and at bedtime.     Past Medical History:  Diagnosis Date   Acute on chronic respiratory failure with hypoxia ([place]) 10/10/2016   Anxiety    Arthritis    AVM (arteriovenous malformation) of colon    CAD (coronary artery disease)    a. s/p CABG in 2013 b. DES to D1 in 10/2016. c. cath in 07/2018 showing patent grafts with occlusion of D1 at prior stent site and progression of PDA disease --> medical management recommended   Carotid artery disease ([place])    a. [id number] LICA, [id number]    Chronic bronchitis (HCC)    Chronic HFrEF (heart failure with reduced ejection fraction) ([place])    Colon cancer ([place]) 1992   Esophageal stricture    ESRD on hemodialysis ([place])    ESRD due to HTN, started dialysis 2011 and gets HD at [facility] with Dr Hinda Lenis on MWF schedule.  Access is LUA AVF as of Sept 2014.    GERD (gastroesophageal reflux disease)    High cholesterol 12/2011   History of blood transfusion 07/2011; 12/2011; 01/2012 X 2; 04/2012   History of gout    History of lower GI bleeding    Hypertension    Iron deficiency anemia    Jugular vein occlusion, right (HCC)    Mitral regurgitation    a. Moderate by  echo, 02/2012   Mitral valve disease    NSVT (nonsustained ventricular tachycardia) ([place])    Ovarian cancer ([place]) 1992   PAF (paroxysmal atrial fibrillation) ([place])    Pneumonia ~ 2009   PUD (peptic ulcer disease)    TIA (transient ischemic attack)    Tricuspid valve disease     Past Surgical History:  Procedure Laterality Date   A/V SHUNTOGRAM Left 03/19/2019   Procedure: A/V SHUNTOGRAM;  Surgeon: Katha Cabal, MD;  Location: [place] [facility];  Service: Cardiovascular;  Laterality: Left;   ABDOMINAL HYSTERECTOMY  1992   APPENDECTOMY  06/1990   AV FISTULA PLACEMENT  07/2009   left upper arm   AV FISTULA PLACEMENT Right 09/06/2016   Procedure: RIGHT FOREARM ARTERIOVENOUS (AV) GRAFT;  Surgeon: Elam Dutch, MD;  Location: [place];  Service: Vascular;  Laterality: Right;   AV FISTULA PLACEMENT  N/A 02/24/2017   Procedure: INSERTION OF ARTERIOVENOUS (AV) GORE-TEX GRAFT ARM (BRACHIAL AXILLARY);  Surgeon: Katha Cabal, MD;  Location: [facility];  Service: Vascular;  Laterality: N/A;   [place] Right 09/06/2016   Procedure: REMOVAL OF Right Arm ARTERIOVENOUS GORETEX GRAFT and Vein Patch angioplasty of brachial artery;  Surgeon: Angelia Mould, MD;  Location: [place];  Service: Vascular;  Laterality: Right;   BIOPSY  09/26/2019   Procedure: BIOPSY;  Surgeon: Rogene Houston, MD;  [facility];  Service: Endoscopy;;   COLON RESECTION  1992   COLON SURGERY     COLONOSCOPY N/A 03/09/2019   Procedure: COLONOSCOPY;  Surgeon: Rogene Houston, MD;  [facility];  Service: Endoscopy;  Laterality: N/A;   COLONOSCOPY N/A 08/11/2022   Procedure: COLONOSCOPY;  Surgeon: Jerene Bears, MD;  Location: [place];  Service: Gastroenterology;  Laterality: N/A;   COLONOSCOPY WITH PROPOFOL N/A 06/08/2021   Procedure: COLONOSCOPY WITH PROPOFOL;  Surgeon: Harvel Quale, MD;  [facility];  Service: Gastroenterology;  Laterality: N/A;  9:05 /Patient is on dialysis Mon Wed Fri   CORONARY ANGIOPLASTY WITH STENT PLACEMENT  12/15/11   "2"   CORONARY ANGIOPLASTY WITH STENT PLACEMENT  y/2013   "1; makes total of 3" (05/02/2012)   CORONARY ARTERY BYPASS GRAFT  06/13/2012   Procedure: CORONARY ARTERY BYPASS GRAFTING (CABG);  Surgeon: Grace Isaac, MD;  Location: [place];  Service: Open Heart Surgery;  Laterality: N/A;  cabg x four;  using left internal mammary artery, and left leg greater saphenous vein harvested endoscopically   CORONARY STENT INTERVENTION N/A 10/13/2016   Procedure: Coronary Stent Intervention;  Surgeon: Troy Sine, MD;  Location: [place] [facility];  Service: Cardiovascular;  Laterality: N/A;   DIALYSIS/PERMA CATHETER REMOVAL N/A 04/18/2017   Procedure: DIALYSIS/PERMA CATHETER REMOVAL;  Surgeon: Katha Cabal, MD;  Location: [place] [facility];  Service: Cardiovascular;  Laterality: N/A;   DILATION AND CURETTAGE OF UTERUS     ENTEROSCOPY N/A 06/08/2021   Procedure: PUSH ENTEROSCOPY;  Surgeon: Harvel Quale, MD;  [facility];  Service: Gastroenterology;  Laterality: N/A;   ESOPHAGOGASTRODUODENOSCOPY  01/20/2012   Procedure: ESOPHAGOGASTRODUODENOSCOPY (EGD);  Surgeon: Ladene Artist, MD,FACG;  [facility]: [facility] ENDOSCOPY;  Service: Endoscopy;  Laterality: N/A;   ESOPHAGOGASTRODUODENOSCOPY N/A 03/26/2013   Procedure: ESOPHAGOGASTRODUODENOSCOPY (EGD);  Surgeon: Irene Shipper, MD;  Location: [facility] ENDOSCOPY;  Service: Endoscopy;  Laterality: N/A;   ESOPHAGOGASTRODUODENOSCOPY N/A 04/30/2015   Procedure: ESOPHAGOGASTRODUODENOSCOPY (EGD);  Surgeon: Rogene Houston, MD;  [facility];  Service: Endoscopy;  Laterality: N/A;  1pm - moved to 10/20 @ 1:10   ESOPHAGOGASTRODUODENOSCOPY N/A 07/29/2016   Procedure: ESOPHAGOGASTRODUODENOSCOPY (EGD);  Surgeon: Manus Gunning, MD;  Location: [place];  Service: Gastroenterology;  Laterality: N/A;  enteroscopy   ESOPHAGOGASTRODUODENOSCOPY N/A 09/26/2019   Procedure: ESOPHAGOGASTRODUODENOSCOPY (EGD);  Surgeon: Rogene Houston, MD;  [facility];  Service: Endoscopy;  Laterality: N/A;  1250   ESOPHAGOGASTRODUODENOSCOPY N/A 08/11/2022   Procedure: ESOPHAGOGASTRODUODENOSCOPY (EGD);  Surgeon: Jerene Bears, MD;  Location: [facility] ENDOSCOPY;  Service: Gastroenterology;  Laterality: N/A;   ESOPHAGOGASTRODUODENOSCOPY (EGD) WITH PROPOFOL N/A 02/05/2021   Procedure: ESOPHAGOGASTRODUODENOSCOPY (EGD) WITH PROPOFOL;  Surgeon: Eloise Harman, DO;  [facility];  Service: Endoscopy;  Laterality: N/A;   ESOPHAGOGASTRODUODENOSCOPY (EGD) WITH PROPOFOL N/A 08/27/2022   Procedure: ESOPHAGOGASTRODUODENOSCOPY (EGD) WITH PROPOFOL;  Surgeon: Eloise Harman, DO;  [facility];  Service: Endoscopy;  Laterality: N/A;   GIVENS CAPSULE STUDY N/A  03/07/2019   Procedure: GIVENS CAPSULE STUDY;  Surgeon: Rogene Houston, MD;  [facility];  Service: Endoscopy;  Laterality: N/A;  7:30   GIVENS CAPSULE STUDY N/A 04/22/2021   Procedure: GIVENS CAPSULE STUDY;  Surgeon: Rogene Houston, MD;  [facility];  Service: Endoscopy;  Laterality: N/A;  7:30   GIVENS CAPSULE STUDY N/A 08/27/2022   Procedure: GIVENS CAPSULE STUDY;  Surgeon: Eloise Harman, DO;  [facility];  Service: Endoscopy;  Laterality: N/A;   HOT HEMOSTASIS N/A 08/11/2022   Procedure: HOT HEMOSTASIS (ARGON PLASMA COAGULATION/BICAP);  Surgeon: Jerene Bears, MD;  Location: [facility] ENDOSCOPY;  Service: Gastroenterology;  Laterality: N/A;   INSERTION OF DIALYSIS CATHETER N/A 10/05/2020   Procedure: ABORTED TUNNELED DIALYSIS CATHETER PLACEMENT RIGHT INTERNAL JUGULAR VEIN ;  Surgeon: Virl Cagey, MD;  [facility];  Service: General;  Laterality: N/A;   INTRAOPERATIVE TRANSESOPHAGEAL ECHOCARDIOGRAM  06/13/2012   Procedure: INTRAOPERATIVE TRANSESOPHAGEAL ECHOCARDIOGRAM;  Surgeon: Grace Isaac, MD;  Location: [place];  Service: Open Heart Surgery;  Laterality: N/A;   IR DIALY SHUNT INTRO NEEDLE/INTRACATH INITIAL W/IMG LEFT Left 10/06/2020   IR FLUORO GUIDE CV LINE RIGHT  06/17/2020   IR GENERIC HISTORICAL  07/26/2016   IR FLUORO GUIDE CV LINE RIGHT 07/26/2016 Greggory Keen, MD [facility]   IR GENERIC HISTORICAL  07/26/2016   IR US GUIDE VASC ACCESS RIGHT 07/26/2016 Greggory Keen, MD [facility]   IR GENERIC HISTORICAL  08/02/2016   IR US GUIDE VASC ACCESS RIGHT 08/02/2016 Greggory Keen, MD [facility]   IR GENERIC HISTORICAL  08/02/2016   IR FLUORO GUIDE CV LINE RIGHT 08/02/2016 Greggory Keen, MD [facility]   IR RADIOLOGY PERIPHERAL GUIDED IV START  03/28/2017   IR REMOVAL TUN CV CATH W/O FL  08/11/2020   IR THROMBECTOMY AV FISTULA W/THROMBOLYSIS INC/SHUNT/IMG LEFT Left 06/17/2020   IR US GUIDE VASC ACCESS LEFT  06/17/2020   IR US GUIDE VASC ACCESS  RIGHT  03/28/2017   IR US GUIDE VASC ACCESS RIGHT  06/17/2020   LEFT HEART CATH AND CORONARY ANGIOGRAPHY N/A 09/20/2016   Procedure: Left Heart Cath and Coronary Angiography;  Surgeon: [name], MD;  Location: [place] [facility];  Service: Cardiovascular;  Laterality: N/A;   LEFT HEART CATH AND CORS/GRAFTS ANGIOGRAPHY N/A 10/13/2016   Procedure: Left Heart Cath and Cors/Grafts Angiography;  Surgeon: Troy Sine, MD;  Location: [place] [facility];  Service: Cardiovascular;  Laterality: N/A;  LEFT HEART CATH AND CORS/GRAFTS ANGIOGRAPHY N/A 07/13/2018   Procedure: LEFT HEART CATH AND CORS/GRAFTS ANGIOGRAPHY;  Surgeon: Martinique, Peter M, MD;  Location: [place] [facility];  Service: Cardiovascular;  Laterality: N/A;   LEFT HEART CATH AND CORS/GRAFTS ANGIOGRAPHY N/A 07/22/2021   Procedure: LEFT HEART CATH AND CORS/GRAFTS ANGIOGRAPHY;  Surgeon: Lorretta Harp, MD;  Location: [place] [facility];  Service: Cardiovascular;  Laterality: N/A;   LEFT HEART CATHETERIZATION WITH CORONARY ANGIOGRAM N/A 12/15/2011   Procedure: LEFT HEART CATHETERIZATION WITH CORONARY ANGIOGRAM;  Surgeon: Burnell Blanks, MD;  Location: [facility] CATH LAB;  Service: Cardiovascular;  Laterality: N/A;   LEFT HEART CATHETERIZATION WITH CORONARY ANGIOGRAM N/A 01/10/2012   Procedure: LEFT HEART CATHETERIZATION WITH CORONARY ANGIOGRAM;  Surgeon: Peter M Martinique, MD;  Location: [facility] CATH LAB;  Service: Cardiovascular;  Laterality: N/A;   LEFT HEART CATHETERIZATION WITH CORONARY ANGIOGRAM N/A 06/08/2012   Procedure: LEFT HEART CATHETERIZATION WITH CORONARY ANGIOGRAM;  Surgeon: Burnell Blanks, MD;  Location: [facility] CATH LAB;  Service: Cardiovascular;  Laterality: N/A;   LEFT HEART CATHETERIZATION WITH CORONARY/GRAFT ANGIOGRAM N/A 12/10/2013   Procedure: LEFT HEART CATHETERIZATION WITH Beatrix Fetters;  Surgeon: [name], MD;  Location: [facility] CATH LAB;  Service: Cardiovascular;  Laterality: N/A;   OVARY SURGERY     ovarian  cancer   POLYPECTOMY  03/09/2019   Procedure: POLYPECTOMY;  Surgeon: Rogene Houston, MD;  [facility];  Service: Endoscopy;;  cecal    POLYPECTOMY N/A 09/26/2019   Procedure: DUODENAL POLYPECTOMY;  Surgeon: Rogene Houston, MD;  [facility];  Service: Endoscopy;  Laterality: N/A;   POLYPECTOMY  08/11/2022   Procedure: POLYPECTOMY;  Surgeon: Jerene Bears, MD;  Location: [place];  Service: Gastroenterology;;   REVISION OF ARTERIOVENOUS GORETEX GRAFT N/A 02/24/2017   Procedure: REVISION OF ARTERIOVENOUS GORETEX GRAFT (RESECTION);  Surgeon: Katha Cabal, MD;  Location: [facility];  Service: Vascular;  Laterality: N/A;   REVISON OF ARTERIOVENOUS FISTULA Left 06/19/2020   Procedure: REVISION OF LEFT UPPER ARM AV GRAFT WITH INTERPOSITION JUMP GRAFT USING 6MM GORE LIMB;  Surgeon: Marty Heck, MD;  Location: [place];  Service: Vascular;  Laterality: Left;   SHUNTOGRAM N/A 10/15/2013   Procedure: Fistulogram;  Surgeon: Serafina Mitchell, MD;  Location: [facility] CATH LAB;  Service: Cardiovascular;  Laterality: N/A;   THROMBECTOMY / ARTERIOVENOUS GRAFT REVISION  2011   left upper arm   TUBAL LIGATION  1980's   UPPER EXTREMITY ANGIOGRAPHY Bilateral 12/06/2016   Procedure: Upper Extremity Angiography;  Surgeon: Katha Cabal, MD;  Location: [place] [facility];  Service: Cardiovascular;  Laterality: Bilateral;   UPPER EXTREMITY INTERVENTION Left 06/06/2017   Procedure: UPPER EXTREMITY INTERVENTION;  Surgeon: Katha Cabal, MD;  Location: [place] [facility];  Service: Cardiovascular;  Laterality: Left;    Social History Social History   Tobacco Use   Smoking status: Never   Smokeless tobacco: Never  Vaping Use   Vaping Use: Never used  Substance Use Topics   Alcohol use: No    Alcohol/week: 0.0 standard drinks of alcohol   Drug use: No    Family History Family History  Problem Relation Age of Onset   Heart disease Mother        Heart Disease before  age 18   Hyperlipidemia Mother    Hypertension Mother    Diabetes Mother    Heart attack Mother    Heart disease Father  Heart Disease before age 57   Hyperlipidemia Father    Hypertension Father    Diabetes Father    Diabetes Sister    Hypertension Sister    Diabetes Brother    Hyperlipidemia Brother    Heart attack Brother    Hypertension Sister    Heart attack Brother    Colon cancer Child 15   Other Other        noncontributory for early CAD   Esophageal cancer Neg Hx    Liver disease Neg Hx    Kidney disease Neg Hx    Colon polyps Neg Hx     Allergies  Allergen Reactions   Amlodipine Swelling   Aspirin Other (See Comments)    High Doses Mess up her stomach; "makes my bowels have blood in them". Takes 81 mg EC Aspirin    Nitrofurantoin Hives   Bactrim [Sulfamethoxazole-Trimethoprim] Rash   Contrast Media [Iodinated Contrast Media] Itching   Iron Itching and Other (See Comments)    "they gave me iron in dialysis; had to give me Benadryl cause I had to have the iron" (05/02/2012)   Tylenol [Acetaminophen] Itching and Other (See Comments)    Makes her feet on fire per pt - can tolerate per pt   Gabapentin Other (See Comments)    Unknown reaction   Iron Sucrose Other (See Comments)    Unknown   Ranexa [Ranolazine] Other (See Comments)    Myoclonus-hospitalized    Sucroferric Oxyhydroxide Other (See Comments)    Unknown   Dexilant [Dexlansoprazole] Other (See Comments)    Upset stomach   Hydralazine Itching    Has tolerated while inpatient Has tolerated while inpatient   Levaquin [Levofloxacin In D5w] Rash   Morphine And Related Itching and Other (See Comments)    Itching in feet   Pantoprazole Rash   Plavix [Clopidogrel Bisulfate] Rash   Protonix [Pantoprazole Sodium] Rash   Venofer [Ferric Oxide] Itching and Other (See Comments)    Patient reports using Benadryl prior to doses as [place] (Negative unless  checked)  Constitutional: [] Weight loss  [] Fever  [] Chills Cardiac: [] Chest pain   [] Chest pressure   [] Palpitations   [] Shortness of breath when laying flat   [] Shortness of breath with exertion. Vascular:  [] Pain in legs with walking   [] Pain in legs at rest  [] History of DVT   [] Phlebitis   [] Swelling in legs   [] Varicose veins   [] Non-healing ulcers Pulmonary:   [] Uses home oxygen   [] Productive cough   [] Hemoptysis   [] Wheeze  [] COPD   [] Asthma Neurologic:  [] Dizziness   [] Seizures   [] History of stroke   [] History of TIA  [] Aphasia   [] Vissual changes   [] Weakness or numbness in arm   [] Weakness or numbness in leg Musculoskeletal:   [] Joint swelling   [] Joint pain   [] Low back pain Hematologic:  [] Easy bruising  [] Easy bleeding   [] Hypercoagulable state   [] Anemic Gastrointestinal:  [] Diarrhea   [] Vomiting  [] Gastroesophageal reflux/heartburn   [] Difficulty swallowing. Genitourinary:  [x] Chronic kidney disease   [] Difficult urination  [] Frequent urination   [] Blood in urine Skin:  [] Rashes   [] Ulcers  Psychological:  [] History of anxiety   []  History of major depression.  Physical Examination  Vitals:   10/04/22 1156  BP: (!) 165/67  Pulse: (!) 53  Resp: 18  Temp: 98.1 F (36.7 C)  TempSrc: Oral  SpO2: 97%   There is no  height or weight on file to calculate BMI. Gen: WD/WN, NAD Head: [place]/AT, No temporalis wasting.  Ear/Nose/Throat: Hearing grossly intact, nares w/o erythema or drainage Eyes: PER, EOMI, sclera nonicteric.  Neck: Supple, no gross masses or lesions.  No JVD.  Pulmonary:  Good air movement, no audible wheezing, no use of accessory muscles.  Cardiac: RRR, precordium non-hyperdynamic. Vascular:   Left brachial axillary AV graft no thrill no bruit Vessel Right Left  Radial Palpable Palpable  Brachial Palpable Palpable  Gastrointestinal: soft, non-distended. No guarding/no peritoneal signs.  Musculoskeletal: M/S 5/5 throughout.  No deformity.  Neurologic: CN 2-12  intact. Pain and light touch intact in extremities.  Symmetrical.  Speech is fluent. Motor exam as listed above. Psychiatric: Judgment intact, Mood & affect appropriate for pt's clinical situation. Dermatologic: No rashes or ulcers noted.  No changes consistent with cellulitis.   CBC Lab Results  Component Value Date   WBC 7.4 10/02/2022   HGB 9.0 (L) 10/02/2022   HCT 27.9 (L) 10/02/2022   MCV 112.5 (H) 10/02/2022   PLT 239 10/02/2022    BMET    Component Value Date/Time   NA 135 10/02/2022 1841   K 3.9 10/02/2022 1841   CL 98 10/02/2022 1841   CO2 24 10/02/2022 1841   GLUCOSE 118 (H) 10/02/2022 1841   BUN 42 (H) 10/02/2022 1841   CREATININE 7.10 (H) 10/02/2022 1841   CREATININE 6.57 (H) 03/05/2019 1159   CALCIUM 8.5 (L) 10/02/2022 1841   CALCIUM 8.1 (L) 05/29/2013 1814   GFRNONAA 5 (L) 10/02/2022 1841   GFRNONAA 6 (L) 03/05/2019 1159   GFRAA 9 (L) 02/12/2020 1712   GFRAA 6 (L) 03/05/2019 1159   Estimated Creatinine Clearance: 4.8 mL/min (A) (by C-G formula based on SCr of 7.1 mg/dL (H)).  COAG Lab Results  Component Value Date   INR 1.3 (H) 11/08/2021   INR 1.0 02/03/2021   INR 1.1 06/12/2020    Radiology DG Chest 2 View  Result Date: 10/02/2022 CLINICAL DATA:  cough EXAM: CHEST - 2 VIEW COMPARISON:  Chest x-ray 08/26/2022, CT chest 07/16/2021 FINDINGS: Left chest wall dialysis catheter with tip overlying the right atrium. The heart and mediastinal contours are unchanged. Atherosclerotic plaque. Coronary artery calcification. No focal consolidation. Coarsened interstitial markings consistent with known bronchitic changes. No pleural effusion. No pneumothorax. No acute osseous abnormality.  Sternotomy wires are intact. IMPRESSION: 1. Bronchitic changes. 2.  Aortic Atherosclerosis (ICD10-I70.0). Electronically Signed   By: Iven Finn M.D.   On: 10/02/2022 19:08     Assessment/Plan 1. ESRD (end stage renal disease) on dialysis [facility]) Recommend:   The patient is  experiencing increasing problems with their dialysis access.   Patient does not likely have a salvageable AV graft as this has been occluded for months.  A fistulagram with the intention for intervention can be considered but is not likely to be successful.  The intention of the intervention would be to try to restore access in her left brachial axillary AV graft.  As well as improve the quality of dialysis therapy.  Venograms will be performed to see if other options are available.   Given the patient's heavy arterial calcification in the right upper extremity, this would be in less than optimal area and we will try to possibly revise the left upper extremity access if we are unable to declot.    Additionally, if the access is able to be revised into some sort of a hero graft, additional imaging will be necessary  to plan this intervention that can also be done at the time of fistulogram.    The risks, benefits and alternative therapies were reviewed in detail with the patient.  All questions were answered.  The patient agrees to proceed with angio/intervention.     The patient will follow up with me in the office after the procedure.     2. Primary hypertension Continue antihypertensive medications as already ordered, these medications have been reviewed and there are no changes at this time.   3. Hyperlipidemia, unspecified hyperlipidemia type Continue statin as ordered and reviewed, no changes at this time    Hortencia Pilar, MD  10/04/2022 12:28 PM

## 2022-10-04 NOTE — Progress Notes (Signed)
MRN : [id number]  Shawna [name] is a 82 y.o. ([birth year]) female who presents with chief complaint of check access.  History of Present Illness:   The patient presents to [facility] to plan for dialysis access.  Back in January she presented to the office at which time noninvasive studies showed an occluded brachial axillary AV graft. Based upon the patient's history I suspect this has been ongoing since prior to June 09, 2022. On that date the patient went to the [facility] emergency department due to a bleeding AV fistula and she had a stitch placed to help stop the bleeding. Following that she was unable to use her access. She notes that around that time she had a PermCath placed in the left chest. Since that time she has been utilizing that and has been working without any significant issue. The patient also underwent vein mapping of the right upper extremity but she has heavily calcified arteries with some significant disease in the brachial artery.   Current Meds  Medication Sig   amiodarone (PACERONE) 100 MG tablet Take 1 tablet (100 mg total) by mouth daily.   Aspirin 81 MG CAPS Take 1 capsule by mouth daily at 12 noon.   benzonatate (TESSALON) 100 MG capsule Take 100 mg by mouth 3 (three) times daily as needed for cough.   folic acid (FOLVITE) 1 MG tablet Take 1 mg by mouth daily.   hydrOXYzine (ATARAX) 25 MG tablet Take 25 mg by mouth every 6 (six) hours as needed for itching.   levothyroxine (SYNTHROID) 75 MCG tablet Take 1 tablet (75 mcg total) by mouth daily before breakfast.   lisinopril (ZESTRIL) 20 MG tablet Take 1 tablet (20 mg total) by mouth daily.   metoprolol tartrate (LOPRESSOR) 25 MG tablet Take 0.5 tablets (12.5 mg total) by mouth 2 (two) times daily.   multivitamin (RENA-VIT) TABS tablet Take 1 tablet by mouth daily.   sevelamer carbonate (RENVELA) 800 MG tablet Take 3,200 mg by mouth in the morning and at bedtime.     Past Medical History:  Diagnosis Date   Acute on chronic respiratory failure with hypoxia ([place]) 10/10/2016   Anxiety    Arthritis    AVM (arteriovenous malformation) of colon    CAD (coronary artery disease)    a. s/p CABG in 2013 b. DES to D1 in 10/2016. c. cath in 07/2018 showing patent grafts with occlusion of D1 at prior stent site and progression of PDA disease --> medical management recommended   Carotid artery disease ([place])    a. [id number] LICA, [id number]    Chronic bronchitis (HCC)    Chronic HFrEF (heart failure with reduced ejection fraction) ([place])    Colon cancer ([place]) 1992   Esophageal stricture    ESRD on hemodialysis ([place])    ESRD due to HTN, started dialysis 2011 and gets HD at [facility] with Dr Hinda Lenis on MWF schedule.  Access is LUA AVF as of Sept 2014.    GERD (gastroesophageal reflux disease)    High cholesterol 12/2011   History of blood transfusion 07/2011; 12/2011; 01/2012 X 2; 04/2012   History of gout    History of lower GI bleeding    Hypertension    Iron deficiency anemia    Jugular vein occlusion, right (HCC)    Mitral regurgitation    a. Moderate by  echo, 02/2012   Mitral valve disease    NSVT (nonsustained ventricular tachycardia) ([place])    Ovarian cancer ([place]) 1992   PAF (paroxysmal atrial fibrillation) ([place])    Pneumonia ~ 2009   PUD (peptic ulcer disease)    TIA (transient ischemic attack)    Tricuspid valve disease     Past Surgical History:  Procedure Laterality Date   A/V SHUNTOGRAM Left 03/19/2019   Procedure: A/V SHUNTOGRAM;  Surgeon: Katha Cabal, MD;  Location: [place] [facility];  Service: Cardiovascular;  Laterality: Left;   ABDOMINAL HYSTERECTOMY  1992   APPENDECTOMY  06/1990   AV FISTULA PLACEMENT  07/2009   left upper arm   AV FISTULA PLACEMENT Right 09/06/2016   Procedure: RIGHT FOREARM ARTERIOVENOUS (AV) GRAFT;  Surgeon: Elam Dutch, MD;  Location: [place];  Service: Vascular;  Laterality: Right;   AV FISTULA PLACEMENT  N/A 02/24/2017   Procedure: INSERTION OF ARTERIOVENOUS (AV) GORE-TEX GRAFT ARM (BRACHIAL AXILLARY);  Surgeon: Katha Cabal, MD;  Location: [facility];  Service: Vascular;  Laterality: N/A;   [place] Right 09/06/2016   Procedure: REMOVAL OF Right Arm ARTERIOVENOUS GORETEX GRAFT and Vein Patch angioplasty of brachial artery;  Surgeon: Angelia Mould, MD;  Location: [place];  Service: Vascular;  Laterality: Right;   BIOPSY  09/26/2019   Procedure: BIOPSY;  Surgeon: Rogene Houston, MD;  [facility];  Service: Endoscopy;;   COLON RESECTION  1992   COLON SURGERY     COLONOSCOPY N/A 03/09/2019   Procedure: COLONOSCOPY;  Surgeon: Rogene Houston, MD;  [facility];  Service: Endoscopy;  Laterality: N/A;   COLONOSCOPY N/A 08/11/2022   Procedure: COLONOSCOPY;  Surgeon: Jerene Bears, MD;  Location: [place];  Service: Gastroenterology;  Laterality: N/A;   COLONOSCOPY WITH PROPOFOL N/A 06/08/2021   Procedure: COLONOSCOPY WITH PROPOFOL;  Surgeon: Harvel Quale, MD;  [facility];  Service: Gastroenterology;  Laterality: N/A;  9:05 /Patient is on dialysis Mon Wed Fri   CORONARY ANGIOPLASTY WITH STENT PLACEMENT  12/15/11   "2"   CORONARY ANGIOPLASTY WITH STENT PLACEMENT  y/2013   "1; makes total of 3" (05/02/2012)   CORONARY ARTERY BYPASS GRAFT  06/13/2012   Procedure: CORONARY ARTERY BYPASS GRAFTING (CABG);  Surgeon: Grace Isaac, MD;  Location: [place];  Service: Open Heart Surgery;  Laterality: N/A;  cabg x four;  using left internal mammary artery, and left leg greater saphenous vein harvested endoscopically   CORONARY STENT INTERVENTION N/A 10/13/2016   Procedure: Coronary Stent Intervention;  Surgeon: Troy Sine, MD;  Location: [place] [facility];  Service: Cardiovascular;  Laterality: N/A;   DIALYSIS/PERMA CATHETER REMOVAL N/A 04/18/2017   Procedure: DIALYSIS/PERMA CATHETER REMOVAL;  Surgeon: Katha Cabal, MD;  Location: [place] [facility];  Service: Cardiovascular;  Laterality: N/A;   DILATION AND CURETTAGE OF UTERUS     ENTEROSCOPY N/A 06/08/2021   Procedure: PUSH ENTEROSCOPY;  Surgeon: Harvel Quale, MD;  [facility];  Service: Gastroenterology;  Laterality: N/A;   ESOPHAGOGASTRODUODENOSCOPY  01/20/2012   Procedure: ESOPHAGOGASTRODUODENOSCOPY (EGD);  Surgeon: Ladene Artist, MD,FACG;  Location: [facility];  Service: Endoscopy;  Laterality: N/A;   ESOPHAGOGASTRODUODENOSCOPY N/A 03/26/2013   Procedure: ESOPHAGOGASTRODUODENOSCOPY (EGD);  Surgeon: Irene Shipper, MD;  Location: [facility] ENDOSCOPY;  Service: Endoscopy;  Laterality: N/A;   ESOPHAGOGASTRODUODENOSCOPY N/A 04/30/2015   Procedure: ESOPHAGOGASTRODUODENOSCOPY (EGD);  Surgeon: Rogene Houston, MD;  [facility];  Service: Endoscopy;  Laterality: N/A;  1pm - moved to 10/20 @ 1:10   ESOPHAGOGASTRODUODENOSCOPY N/A 07/29/2016   Procedure: ESOPHAGOGASTRODUODENOSCOPY (EGD);  Surgeon: Manus Gunning, MD;  Location: [place];  Service: Gastroenterology;  Laterality: N/A;  enteroscopy   ESOPHAGOGASTRODUODENOSCOPY N/A 09/26/2019   Procedure: ESOPHAGOGASTRODUODENOSCOPY (EGD);  Surgeon: Rogene Houston, MD;  [facility];  Service: Endoscopy;  Laterality: N/A;  1250   ESOPHAGOGASTRODUODENOSCOPY N/A 08/11/2022   Procedure: ESOPHAGOGASTRODUODENOSCOPY (EGD);  Surgeon: Jerene Bears, MD;  Location: [facility] ENDOSCOPY;  Service: Gastroenterology;  Laterality: N/A;   ESOPHAGOGASTRODUODENOSCOPY (EGD) WITH PROPOFOL N/A 02/05/2021   Procedure: ESOPHAGOGASTRODUODENOSCOPY (EGD) WITH PROPOFOL;  Surgeon: Eloise Harman, DO;  [facility];  Service: Endoscopy;  Laterality: N/A;   ESOPHAGOGASTRODUODENOSCOPY (EGD) WITH PROPOFOL N/A 08/27/2022   Procedure: ESOPHAGOGASTRODUODENOSCOPY (EGD) WITH PROPOFOL;  Surgeon: Eloise Harman, DO;  [facility];  Service: Endoscopy;  Laterality: N/A;   GIVENS CAPSULE STUDY N/A  03/07/2019   Procedure: GIVENS CAPSULE STUDY;  Surgeon: Rogene Houston, MD;  [facility];  Service: Endoscopy;  Laterality: N/A;  7:30   GIVENS CAPSULE STUDY N/A 04/22/2021   Procedure: GIVENS CAPSULE STUDY;  Surgeon: Rogene Houston, MD;  [facility];  Service: Endoscopy;  Laterality: N/A;  7:30   GIVENS CAPSULE STUDY N/A 08/27/2022   Procedure: GIVENS CAPSULE STUDY;  Surgeon: Eloise Harman, DO;  [facility];  Service: Endoscopy;  Laterality: N/A;   HOT HEMOSTASIS N/A 08/11/2022   Procedure: HOT HEMOSTASIS (ARGON PLASMA COAGULATION/BICAP);  Surgeon: Jerene Bears, MD;  Location: [facility]COPY;  Service: Gastroenterology;  Laterality: N/A;   INSERTION OF DIALYSIS CATHETER N/A 10/05/2020   Procedure: ABORTED TUNNELED DIALYSIS CATHETER PLACEMENT RIGHT INTERNAL JUGULAR VEIN ;  Surgeon: Virl Cagey, MD;  Location: [facility];  Service: General;  Laterality: N/A;   INTRAOPERATIVE TRANSESOPHAGEAL ECHOCARDIOGRAM  06/13/2012   Procedure: INTRAOPERATIVE TRANSESOPHAGEAL ECHOCARDIOGRAM;  Surgeon: Grace Isaac, MD;  Location: [place];  Service: Open Heart Surgery;  Laterality: N/A;   IR DIALY SHUNT INTRO NEEDLE/INTRACATH INITIAL W/IMG LEFT Left 10/06/2020   IR FLUORO GUIDE CV LINE RIGHT  06/17/2020   IR GENERIC HISTORICAL  07/26/2016   IR FLUORO GUIDE CV LINE RIGHT 07/26/2016 Greggory Keen, MD [facility]   IR GENERIC HISTORICAL  07/26/2016   IR US GUIDE VASC ACCESS RIGHT 07/26/2016 Greggory Keen, MD [facility]   IR GENERIC HISTORICAL  08/02/2016   IR US GUIDE VASC ACCESS RIGHT 08/02/2016 Greggory Keen, MD [facility]   IR GENERIC HISTORICAL  08/02/2016   IR FLUORO GUIDE CV LINE RIGHT 08/02/2016 Greggory Keen, MD [facility]   IR RADIOLOGY PERIPHERAL GUIDED IV START  03/28/2017   IR REMOVAL TUN CV CATH W/O FL  08/11/2020   IR THROMBECTOMY AV FISTULA W/THROMBOLYSIS INC/SHUNT/IMG LEFT Left 06/17/2020   IR US GUIDE VASC ACCESS LEFT  06/17/2020   IR US GUIDE VASC ACCESS  RIGHT  03/28/2017   IR US GUIDE VASC ACCESS RIGHT  06/17/2020   LEFT HEART CATH AND CORONARY ANGIOGRAPHY N/A 09/20/2016   Procedure: Left Heart Cath and Coronary Angiography;  Surgeon: [name], MD;  Location: [place] [facility];  Service: Cardiovascular;  Laterality: N/A;   LEFT HEART CATH AND CORS/GRAFTS ANGIOGRAPHY N/A 10/13/2016   Procedure: Left Heart Cath and Cors/Grafts Angiography;  Surgeon: Troy Sine, MD;  Location: [place] [facility];  Service: Cardiovascular;  Laterality: N/A;  LEFT HEART CATH AND CORS/GRAFTS ANGIOGRAPHY N/A 07/13/2018   Procedure: LEFT HEART CATH AND CORS/GRAFTS ANGIOGRAPHY;  Surgeon: Martinique, Peter M, MD;  Location: [place] [facility];  Service: Cardiovascular;  Laterality: N/A;   LEFT HEART CATH AND CORS/GRAFTS ANGIOGRAPHY N/A 07/22/2021   Procedure: LEFT HEART CATH AND CORS/GRAFTS ANGIOGRAPHY;  Surgeon: Lorretta Harp, MD;  Location: [place] [facility];  Service: Cardiovascular;  Laterality: N/A;   LEFT HEART CATHETERIZATION WITH CORONARY ANGIOGRAM N/A 12/15/2011   Procedure: LEFT HEART CATHETERIZATION WITH CORONARY ANGIOGRAM;  Surgeon: Burnell Blanks, MD;  Location: [facility] CATH LAB;  Service: Cardiovascular;  Laterality: N/A;   LEFT HEART CATHETERIZATION WITH CORONARY ANGIOGRAM N/A 01/10/2012   Procedure: LEFT HEART CATHETERIZATION WITH CORONARY ANGIOGRAM;  Surgeon: Peter M Martinique, MD;  Location: [facility] CATH LAB;  Service: Cardiovascular;  Laterality: N/A;   LEFT HEART CATHETERIZATION WITH CORONARY ANGIOGRAM N/A 06/08/2012   Procedure: LEFT HEART CATHETERIZATION WITH CORONARY ANGIOGRAM;  Surgeon: Burnell Blanks, MD;  Location: [facility] CATH LAB;  Service: Cardiovascular;  Laterality: N/A;   LEFT HEART CATHETERIZATION WITH CORONARY/GRAFT ANGIOGRAM N/A 12/10/2013   Procedure: LEFT HEART CATHETERIZATION WITH Beatrix Fetters;  Surgeon: [name], MD;  Location: [name] CATH LAB;  Service: Cardiovascular;  Laterality: N/A;   OVARY SURGERY     ovarian  cancer   POLYPECTOMY  03/09/2019   Procedure: POLYPECTOMY;  Surgeon: Rogene Houston, MD;  [facility];  Service: Endoscopy;;  cecal    POLYPECTOMY N/A 09/26/2019   Procedure: DUODENAL POLYPECTOMY;  Surgeon: Rogene Houston, MD;  [facility];  Service: Endoscopy;  Laterality: N/A;   POLYPECTOMY  08/11/2022   Procedure: POLYPECTOMY;  Surgeon: Jerene Bears, MD;  Location: [place];  Service: Gastroenterology;;   REVISION OF ARTERIOVENOUS GORETEX GRAFT N/A 02/24/2017   Procedure: REVISION OF ARTERIOVENOUS GORETEX GRAFT (RESECTION);  Surgeon: Katha Cabal, MD;  Location: [facility];  Service: Vascular;  Laterality: N/A;   REVISON OF ARTERIOVENOUS FISTULA Left 06/19/2020   Procedure: REVISION OF LEFT UPPER ARM AV GRAFT WITH INTERPOSITION JUMP GRAFT USING 6MM GORE LIMB;  Surgeon: Marty Heck, MD;  Location: [place];  Service: Vascular;  Laterality: Left;   SHUNTOGRAM N/A 10/15/2013   Procedure: Fistulogram;  Surgeon: Serafina Mitchell, MD;  Location: [facility] CATH LAB;  Service: Cardiovascular;  Laterality: N/A;   THROMBECTOMY / ARTERIOVENOUS GRAFT REVISION  2011   left upper arm   TUBAL LIGATION  1980's   UPPER EXTREMITY ANGIOGRAPHY Bilateral 12/06/2016   Procedure: Upper Extremity Angiography;  Surgeon: Katha Cabal, MD;  Location: [place] [facility];  Service: Cardiovascular;  Laterality: Bilateral;   UPPER EXTREMITY INTERVENTION Left 06/06/2017   Procedure: UPPER EXTREMITY INTERVENTION;  Surgeon: Katha Cabal, MD;  Location: [place] [facility];  Service: Cardiovascular;  Laterality: Left;    Social History Social History   Tobacco Use   Smoking status: Never   Smokeless tobacco: Never  Vaping Use   Vaping Use: Never used  Substance Use Topics   Alcohol use: No    Alcohol/week: 0.0 standard drinks of alcohol   Drug use: No    Family History Family History  Problem Relation Age of Onset   Heart disease Mother        Heart Disease before  age 2   Hyperlipidemia Mother    Hypertension Mother    Diabetes Mother    Heart attack Mother    Heart disease Father  Heart Disease before age 82   Hyperlipidemia Father    Hypertension Father    Diabetes Father    Diabetes Sister    Hypertension Sister    Diabetes Brother    Hyperlipidemia Brother    Heart attack Brother    Hypertension Sister    Heart attack Brother    Colon cancer Child 45   Other Other        noncontributory for early CAD   Esophageal cancer Neg Hx    Liver disease Neg Hx    Kidney disease Neg Hx    Colon polyps Neg Hx     Allergies  Allergen Reactions   Amlodipine Swelling   Aspirin Other (See Comments)    High Doses Mess up her stomach; "makes my bowels have blood in them". Takes 81 mg EC Aspirin    Nitrofurantoin Hives   Bactrim [Sulfamethoxazole-Trimethoprim] Rash   Contrast Media [Iodinated Contrast Media] Itching   Iron Itching and Other (See Comments)    "they gave me iron in dialysis; had to give me Benadryl cause I had to have the iron" (05/02/2012)   Tylenol [Acetaminophen] Itching and Other (See Comments)    Makes her feet on fire per pt - can tolerate per pt   Gabapentin Other (See Comments)    Unknown reaction   Iron Sucrose Other (See Comments)    Unknown   Ranexa [Ranolazine] Other (See Comments)    Myoclonus-hospitalized    Sucroferric Oxyhydroxide Other (See Comments)    Unknown   Dexilant [Dexlansoprazole] Other (See Comments)    Upset stomach   Hydralazine Itching    Has tolerated while inpatient Has tolerated while inpatient   Levaquin [Levofloxacin In D5w] Rash   Morphine And Related Itching and Other (See Comments)    Itching in feet   Pantoprazole Rash   Plavix [Clopidogrel Bisulfate] Rash   Protonix [Pantoprazole Sodium] Rash   Venofer [Ferric Oxide] Itching and Other (See Comments)    Patient reports using Benadryl prior to doses as [place] (Negative unless  checked)  Constitutional: [] Weight loss  [] Fever  [] Chills Cardiac: [] Chest pain   [] Chest pressure   [] Palpitations   [] Shortness of breath when laying flat   [] Shortness of breath with exertion. Vascular:  [] Pain in legs with walking   [] Pain in legs at rest  [] History of DVT   [] Phlebitis   [] Swelling in legs   [] Varicose veins   [] Non-healing ulcers Pulmonary:   [] Uses home oxygen   [] Productive cough   [] Hemoptysis   [] Wheeze  [] COPD   [] Asthma Neurologic:  [] Dizziness   [] Seizures   [] History of stroke   [] History of TIA  [] Aphasia   [] Vissual changes   [] Weakness or numbness in arm   [] Weakness or numbness in leg Musculoskeletal:   [] Joint swelling   [] Joint pain   [] Low back pain Hematologic:  [] Easy bruising  [] Easy bleeding   [] Hypercoagulable state   [] Anemic Gastrointestinal:  [] Diarrhea   [] Vomiting  [] Gastroesophageal reflux/heartburn   [] Difficulty swallowing. Genitourinary:  [x] Chronic kidney disease   [] Difficult urination  [] Frequent urination   [] Blood in urine Skin:  [] Rashes   [] Ulcers  Psychological:  [] History of anxiety   []  History of major depression.  Physical Examination  Vitals:   10/04/22 1156  BP: (!) 165/67  Pulse: (!) 53  Resp: 18  Temp: 98.1 F (36.7 C)  TempSrc: Oral  SpO2: 97%   There is no  height or weight on file to calculate BMI. Gen: WD/WN, NAD Head: [place]/AT, No temporalis wasting.  Ear/Nose/Throat: Hearing grossly intact, nares w/o erythema or drainage Eyes: PER, EOMI, sclera nonicteric.  Neck: Supple, no gross masses or lesions.  No JVD.  Pulmonary:  Good air movement, no audible wheezing, no use of accessory muscles.  Cardiac: RRR, precordium non-hyperdynamic. Vascular:   Left brachial axillary AV graft no thrill no bruit Vessel Right Left  Radial Palpable Palpable  Brachial Palpable Palpable  Gastrointestinal: soft, non-distended. No guarding/no peritoneal signs.  Musculoskeletal: M/S 5/5 throughout.  No deformity.  Neurologic: CN 2-12  intact. Pain and light touch intact in extremities.  Symmetrical.  Speech is fluent. Motor exam as listed above. Psychiatric: Judgment intact, Mood & affect appropriate for pt's clinical situation. Dermatologic: No rashes or ulcers noted.  No changes consistent with cellulitis.   CBC Lab Results  Component Value Date   WBC 7.4 10/02/2022   HGB 9.0 (L) 10/02/2022   HCT 27.9 (L) 10/02/2022   MCV 112.5 (H) 10/02/2022   PLT 239 10/02/2022    BMET    Component Value Date/Time   NA 135 10/02/2022 1841   K 3.9 10/02/2022 1841   CL 98 10/02/2022 1841   CO2 24 10/02/2022 1841   GLUCOSE 118 (H) 10/02/2022 1841   BUN 42 (H) 10/02/2022 1841   CREATININE 7.10 (H) 10/02/2022 1841   CREATININE 6.57 (H) 03/05/2019 1159   CALCIUM 8.5 (L) 10/02/2022 1841   CALCIUM 8.1 (L) 05/29/2013 1814   GFRNONAA 5 (L) 10/02/2022 1841   GFRNONAA 6 (L) 03/05/2019 1159   GFRAA 9 (L) 02/12/2020 1712   GFRAA 6 (L) 03/05/2019 1159   Estimated Creatinine Clearance: 4.8 mL/min (A) (by C-G formula based on SCr of 7.1 mg/dL (H)).  COAG Lab Results  Component Value Date   INR 1.3 (H) 11/08/2021   INR 1.0 02/03/2021   INR 1.1 06/12/2020    Radiology DG Chest 2 View  Result Date: 10/02/2022 CLINICAL DATA:  cough EXAM: CHEST - 2 VIEW COMPARISON:  Chest x-ray 08/26/2022, CT chest 07/16/2021 FINDINGS: Left chest wall dialysis catheter with tip overlying the right atrium. The heart and mediastinal contours are unchanged. Atherosclerotic plaque. Coronary artery calcification. No focal consolidation. Coarsened interstitial markings consistent with known bronchitic changes. No pleural effusion. No pneumothorax. No acute osseous abnormality.  Sternotomy wires are intact. IMPRESSION: 1. Bronchitic changes. 2.  Aortic Atherosclerosis (ICD10-I70.0). Electronically Signed   By: Iven Finn M.D.   On: 10/02/2022 19:08     Assessment/Plan 1. ESRD (end stage renal disease) on dialysis [facility]) Recommend:   The patient is  experiencing increasing problems with their dialysis access.   Patient does not likely have a salvageable AV graft as this has been occluded for months.  A fistulagram with the intention for intervention can be considered but is not likely to be successful.  The intention of the intervention would be to try to restore access in her left brachial axillary AV graft.  As well as improve the quality of dialysis therapy.  Venograms will be performed to see if other options are available.   Given the patient's heavy arterial calcification in the right upper extremity, this would be in less than optimal area and we will try to possibly revise the left upper extremity access if we are unable to declot.    Additionally, if the access is able to be revised into some sort of a hero graft, additional imaging will be necessary  to plan this intervention that can also be done at the time of fistulogram.    The risks, benefits and alternative therapies were reviewed in detail with the patient.  All questions were answered.  The patient agrees to proceed with angio/intervention.     The patient will follow up with me in the office after the procedure.     2. Primary hypertension Continue antihypertensive medications as already ordered, these medications have been reviewed and there are no changes at this time.   3. Hyperlipidemia, unspecified hyperlipidemia type Continue statin as ordered and reviewed, no changes at this time    Hortencia Pilar, MD  10/04/2022 12:28 PM

## 2022-10-04 NOTE — Op Note (Signed)
[facility]  Percutaneous Study/Intervention Procedural Note   Date of Surgery: 10/04/2022,2:23 PM  Surgeon:Yaelis Scharfenberg, Dolores Lory   Pre-operative Diagnosis: Superior vena cava syndrome; Complication AV vascular device with multiple past accesses; and stage renal disease requiring hemodialysis  Post-operative diagnosis:  Same  Procedure(s) Performed:  1.  Ultrasound-guided access to the left brachial vein  2.  Left upper extremity venogram  4.  Superior venacavogram  Anesthesia: Conscious sedation was administered under my direct supervision by the interventional radiology RN. IV Versed plus fentanyl were utilized. Continuous ECG, pulse oximetry and blood pressure was monitored throughout the entire procedure. Conscious sedation was administered for a total of 8 minutes and 22 seconds.  Sheath: 5 [place] micropuncture  Contrast: 10 cc   Fluoroscopy Time: 0.0 minutes  Indications:  Patient presents for evaluation for possible hero graft placement. The patient has had multiple access disease in both arms and there is prior documentation of central venous stenosis. Venography is being performed to determine whether placement of a hero graft is feasible. The risks and benefits of been reviewed all questions answered patient agrees to proceed.  Procedure:  Shawna Overmiller Moyeris a 82 y.o. female who was identified and appropriate procedural time out was performed.  The patient was then placed supine on the table and prepped and draped in the usual sterile fashion.  Ultrasound was used to evaluate the existing AV graft and the left brachial vein.  The AV graft is thrombosed all the way to the brachial artery previously noted stents are placed.  Graft appears to be contracted in its midportion.  I do not believe that this is salvageable given the length of time it has been occluded and because there have been several stents placed already.  The brachial vein is imaged, it was patent .   A digital ultrasound image was acquired.  A micro-puncture needle was used to access the left brachial vein under direct ultrasound guidance and a permanent image was saved for the record.  The microwire was then advanced under fluoroscopic guidance followed by micro-sheath.  Hand injection of contrast was then performed to demonstrate the venous anatomy of the left upper arm.  The central images were adequate and therefore the sheath was pulled and pressure was held.  Findings:    Left upper extremity:   The brachial vein is widely patent.  Previously noted access with a venous outflow stent is present.  The axillary vein is reconstituted just proximal to the stent.  The subclavian innominate and superior vena cava's are widely patent.  Summary: Patient is a candidate for revision of her thrombosed left brachial axillary graft.  I am hopeful that the arterial portion of the thrombosed graft could be used as a cuff and then a more proximal axillary dissection could be performed or perhaps a hero graft would be required in which case the tunneled catheter on the left would be moved to the groin area.   Disposition: Patient was taken to the recovery room in stable condition having tolerated the procedure well.  Shawna [name] 10/04/2022,2:23 PM

## 2022-10-04 NOTE — Interval H&P Note (Signed)
History and Physical Interval Note:  10/04/2022 12:32 PM  Bertram Millard Shawna [name]  has presented today for surgery, with the diagnosis of L arm venogram and fistulagram w possible intervention    End Stage Renal.  The various methods of treatment have been discussed with the patient and family. After consideration of risks, benefits and other options for treatment, the patient has consented to  Procedure(s): UPPER EXTREMITY VENOGRAPHY (Left) A/V Fistulagram (Left) as a surgical intervention.  The patient's history has been reviewed, patient examined, no change in status, stable for surgery.  I have reviewed the patient's chart and labs.  Questions were answered to the patient's satisfaction.     Hortencia Pilar

## 2022-10-05 ENCOUNTER — Encounter: Payer: Self-pay | Admitting: Vascular Surgery

## 2022-10-05 NOTE — Progress Notes (Addendum)
GI Office Note    Referring Provider: Practice, [place] Primary Care Physician:  [facility] Primary Gastroenterologist: Harvel Quale, MD  Date:  10/07/2022  ID:  Shawna [name], DOB 1941-04-13, MRN [id number]   Chief Complaint   Chief Complaint  Patient presents with   Hospitalization Follow-up    Patient here today for a follow up on her recent hospitalization on 10/02/2022. Patient has a history of IDA, and End stage renal disease. She has Hemodialysis on Monday, Wednesday, Friday.   History of Present Illness  Shawna [name] is a 82 y.o. female with a history of dementia, ESRD on HD, Afib on ASA, CAD, heart failure, small bowel AVMs, recurrent GI bleeding, colon cancer s/p resection in 1992, IDA, duodenal adenoma presenting today for hospital follow up.   Admission to [facility] in January 2024 for anemia. Underwent EGD and colonoscopy 08/11/2022. 1 small bowel AVM ablated, small duodenal polyp removed (duodenal adenoma), colonoscopy with healthy-appearing anastomosis x 2, 6 polyps removed in the transverse descending sigmoid colons, tubular adenomas on pathology.  Seen by our service during hospitalization 08/27/22 for anemia. Patient with baseline dementia therefore history difficult to obtain. No melena, brbpr, abdominal pain. Hgb 6.6. on admission and received 2u PRBC. Underwent EGD and VCE as noted below. Advised to consider octreotide subcu outpatient.   Procedure history: 07/2016 EGD for anemia: -prior duodenal AVMs, recent NSTEMI and pending cardiac cath.  2 cm hiatal hernia.  Normal stomach and esophagus.  Solitary duodenal versus jejunal polyp was not removed.  Nonbleeding jejunal AVM treated with APC and may have been source for anemia.   02/2019 colonoscopy.  For evaluation gastrointestinal bleeding: -Pandiverticulosis.  Patent ileocolonic anastomosis.  Removed 2 small polyps (TAs) from ascending colon.  Piecemeal resection using a lift technique of  large polyp (TA) in transverse colon.   09/2019 EGD for evaluation duodenal polyp: Esophagus, Z-line normal.  3 cm hiatal hernia.  Normal stomach and duodenal bulb.  Solitary, diminutive, sessile polyp at D2 was biopsied (adenoma wo HGD).  Exam completed to third duodenum.   02/05/2021 EGD for melena, blood loss anemia: -Showed mild severity esophageal candidiasis without bleeding.  Gastritis, nonbleeding.  Normal examined duodenum to D2.  Treated with Diflucan for 14 days and continue daily oral PPI.   04/22/2021 VCE: -Successful, complete study.  Small, flat polyp in either second or third portion duodenum with no stigmata of bleeding.  2 polyps at distal small bowel, 1 small size the other polyp medium.  No bleeding stigmata in either of these polyps.  Dr Laural Golden rec push enteroscopy.     06/08/2021 colonoscopy, enteroscopy were cxld due to IV infiltration RUE.  LUE not amenable to IV as her fistula is in that arm..  Plans for outpt PICC line and proceed with GI procedures in January 2022, but procedures never happened. Meds include low-dose aspirin.  Prilosec 20 mg/day, Zofran prn.  Folic acid. Mircera, last dose 1/22.   08/11/2022 EGD: -2 cm hiatal hernia, normal esophagus otherwise, duodenal AVM status post APC, 5 mm duodenal polyp removed (duodenal adenoma)   08/11/2022 colonoscopy: -end-to-end ileocolonic anastomosis ascending colon, end-to-side colocolonic anastomosis rectosigmoid colon, normal neoterminal ileum, 6 polyps removed in the transverse, descending, sigmoid colons.  Tubular adenomas on pathology  EGD 08/27/2022: -multiple gastric AVMs treated with APC, small hiatal hernia, no active or stigmata of bleeding. VCE placed   VCE 08/27/22 read 08/28/22 -A few small AVMs in the small bowel, likely jejunum.  2 small  polyps in the distal small bowel previously noted.  No active or stigmata of bleeding.  Capsule appears to sit at the ileocolonic anastomosis for the last 2 to 3 hours of  study.  KUB 08/28/22 showed capsule in RLQ  KUB 08/29/22 with capsule likely within the rectum  (lower midline pelvis)  Hgb 10.6 on 09/19/22 (stable since discharge)  Today: Had pneumonia in the hospital. Still having sinus drainage.Shortness of breath is improved. Energy level is not great. Has been feeling a little week. Has issues with her left leg. No dizziness or chest pain. No abdominal pain. Feels a little dizzy today for the last 30 minutes. No PICA. Good appetite.  Denies melena or BRBPR.  Ate breakfast today but has not drank much and feeling a little dizzy. No headache.   Started some antibiotics this past Monday. Sometimes has diarrhea but not often. No fevers/chills. No N/V, dysphagia.   No chest pain. No recent blood work.   Husband reports her BP is up and down at times.   Current Outpatient Medications  Medication Sig Dispense Refill   ALPRAZolam (XANAX) 0.5 MG tablet Take 1 tablet (0.5 mg total) by mouth daily as needed for anxiety. 10 tablet 0   amiodarone (PACERONE) 100 MG tablet Take 1 tablet (100 mg total) by mouth daily. 30 tablet 0   Aspirin 81 MG CAPS Take 1 capsule by mouth daily at 12 noon.     azithromycin (ZITHROMAX) 250 MG tablet Take 1 tablet (250 mg total) by mouth daily. Take first 2 tablets together, then 1 every day until finished. 6 tablet 0   benzonatate (TESSALON) 100 MG capsule Take 100 mg by mouth 3 (three) times daily as needed for cough.     folic acid (FOLVITE) 1 MG tablet Take 1 mg by mouth daily.     hydrOXYzine (ATARAX) 25 MG tablet Take 25 mg by mouth every 6 (six) hours as needed for itching.     levothyroxine (SYNTHROID) 75 MCG tablet Take 1 tablet (75 mcg total) by mouth daily before breakfast. 30 tablet 0   lisinopril (ZESTRIL) 20 MG tablet Take 1 tablet (20 mg total) by mouth daily. 30 tablet 0   metoprolol tartrate (LOPRESSOR) 25 MG tablet Take 0.5 tablets (12.5 mg total) by mouth 2 (two) times daily. 30 tablet 6   multivitamin  (RENA-VIT) TABS tablet Take 1 tablet by mouth daily.     nitroGLYCERIN (NITROSTAT) 0.4 MG SL tablet Place 0.4 mg under the tongue every 5 (five) minutes as needed for chest pain.     omeprazole (PRILOSEC) 40 MG capsule Take 1 capsule (40 mg total) by mouth daily. 90 capsule 3   ondansetron (ZOFRAN-ODT) 4 MG disintegrating tablet Take 4 mg by mouth every 8 (eight) hours as needed for nausea or vomiting.     rosuvastatin (CRESTOR) 10 MG tablet Take 1 tablet by mouth once daily 90 tablet 0   sevelamer carbonate (RENVELA) 800 MG tablet Take 3,200 mg by mouth in the morning and at bedtime.     No current facility-administered medications for this visit.    Past Medical History:  Diagnosis Date   Acute on chronic respiratory failure with hypoxia ([place]) 10/10/2016   Anxiety    Arthritis    AVM (arteriovenous malformation) of colon    CAD (coronary artery disease)    a. s/p CABG in 2013 b. DES to D1 in 10/2016. c. cath in 07/2018 showing patent grafts with occlusion of D1 at prior stent  site and progression of PDA disease --> medical management recommended   Carotid artery disease ([place])    a. [id number] LICA, [id number]    Chronic bronchitis (HCC)    Chronic HFrEF (heart failure with reduced ejection fraction) (HCC)    Colon cancer ([place]) 1992   Esophageal stricture    ESRD on hemodialysis ([place])    ESRD due to HTN, started dialysis 2011 and gets HD at [facility] with Dr Hinda Lenis on MWF schedule.  Access is LUA AVF as of Sept 2014.    GERD (gastroesophageal reflux disease)    High cholesterol 12/2011   History of blood transfusion 07/2011; 12/2011; 01/2012 X 2; 04/2012   History of gout    History of lower GI bleeding    Hypertension    Iron deficiency anemia    Jugular vein occlusion, right (HCC)    Mitral regurgitation    a. Moderate by echo, 02/2012   Mitral valve disease    NSVT (nonsustained ventricular tachycardia) ([place])    Ovarian cancer ([place]) 1992   PAF (paroxysmal atrial fibrillation) ([place])     Pneumonia ~ 2009   PUD (peptic ulcer disease)    TIA (transient ischemic attack)    Tricuspid valve disease     Past Surgical History:  Procedure Laterality Date   A/V FISTULAGRAM Left 10/04/2022   Procedure: A/V Fistulagram;  Surgeon: Katha Cabal, MD;  Location: [place] [facility];  Service: Cardiovascular;  Laterality: Left;   A/V SHUNTOGRAM Left 03/19/2019   Procedure: A/V SHUNTOGRAM;  Surgeon: Katha Cabal, MD;  Location: [place] [facility];  Service: Cardiovascular;  Laterality: Left;   ABDOMINAL HYSTERECTOMY  1992   APPENDECTOMY  06/1990   AV FISTULA PLACEMENT  07/2009   left upper arm   AV FISTULA PLACEMENT Right 09/06/2016   Procedure: RIGHT FOREARM ARTERIOVENOUS (AV) GRAFT;  Surgeon: Elam Dutch, MD;  Location: [place];  Service: Vascular;  Laterality: Right;   AV FISTULA PLACEMENT N/A 02/24/2017   Procedure: INSERTION OF ARTERIOVENOUS (AV) GORE-TEX GRAFT ARM (BRACHIAL AXILLARY);  Surgeon: Katha Cabal, MD;  Location: [facility];  Service: Vascular;  Laterality: N/A;   [place] Right 09/06/2016   Procedure: REMOVAL OF Right Arm ARTERIOVENOUS GORETEX GRAFT and Vein Patch angioplasty of brachial artery;  Surgeon: Angelia Mould, MD;  Location: [place];  Service: Vascular;  Laterality: Right;   BIOPSY  09/26/2019   Procedure: BIOPSY;  Surgeon: Rogene Houston, MD;  [facility];  Service: Endoscopy;;   COLON RESECTION  1992   COLON SURGERY     COLONOSCOPY N/A 03/09/2019   Procedure: COLONOSCOPY;  Surgeon: Rogene Houston, MD;  [facility];  Service: Endoscopy;  Laterality: N/A;   COLONOSCOPY N/A 08/11/2022   Procedure: COLONOSCOPY;  Surgeon: Jerene Bears, MD;  Location: [place];  Service: Gastroenterology;  Laterality: N/A;   COLONOSCOPY WITH PROPOFOL N/A 06/08/2021   Procedure: COLONOSCOPY WITH PROPOFOL;  Surgeon: Harvel Quale, MD;  [facility];  Service: Gastroenterology;  Laterality: N/A;  9:05  /Patient is on dialysis Mon Wed Fri   CORONARY ANGIOPLASTY WITH STENT PLACEMENT  12/15/11   "2"   CORONARY ANGIOPLASTY WITH STENT PLACEMENT  y/2013   "1; makes total of 3" (05/02/2012)   CORONARY ARTERY BYPASS GRAFT  06/13/2012   Procedure: CORONARY ARTERY BYPASS GRAFTING (CABG);  Surgeon: Grace Isaac, MD;  Location: [place];  Service: Open Heart Surgery;  Laterality: N/A;  cabg  x four;  using left internal mammary artery, and left leg greater saphenous vein harvested endoscopically   CORONARY STENT INTERVENTION N/A 10/13/2016   Procedure: Coronary Stent Intervention;  Surgeon: Troy Sine, MD;  Location: [place] [facility];  Service: Cardiovascular;  Laterality: N/A;   DIALYSIS/PERMA CATHETER REMOVAL N/A 04/18/2017   Procedure: DIALYSIS/PERMA CATHETER REMOVAL;  Surgeon: Katha Cabal, MD;  Location: [place] [facility];  Service: Cardiovascular;  Laterality: N/A;   DILATION AND CURETTAGE OF UTERUS     ENTEROSCOPY N/A 06/08/2021   Procedure: PUSH ENTEROSCOPY;  Surgeon: Harvel Quale, MD;  [facility];  Service: Gastroenterology;  Laterality: N/A;   ESOPHAGOGASTRODUODENOSCOPY  01/20/2012   Procedure: ESOPHAGOGASTRODUODENOSCOPY (EGD);  Surgeon: Ladene Artist, MD,FACG;  [facility]COPY;  Service: Endoscopy;  Laterality: N/A;   ESOPHAGOGASTRODUODENOSCOPY N/A 03/26/2013   Procedure: ESOPHAGOGASTRODUODENOSCOPY (EGD);  Surgeon: Irene Shipper, MD;  Location: [facility] ENDOSCOPY;  Service: Endoscopy;  Laterality: N/A;   ESOPHAGOGASTRODUODENOSCOPY N/A 04/30/2015   Procedure: ESOPHAGOGASTRODUODENOSCOPY (EGD);  Surgeon: Rogene Houston, MD;  [facility];  Service: Endoscopy;  Laterality: N/A;  1pm - moved to 10/20 @ 1:10   ESOPHAGOGASTRODUODENOSCOPY N/A 07/29/2016   Procedure: ESOPHAGOGASTRODUODENOSCOPY (EGD);  Surgeon: Manus Gunning, MD;  Location: [place];  Service: Gastroenterology;  Laterality: N/A;  enteroscopy   ESOPHAGOGASTRODUODENOSCOPY N/A  09/26/2019   Procedure: ESOPHAGOGASTRODUODENOSCOPY (EGD);  Surgeon: Rogene Houston, MD;  [facility];  Service: Endoscopy;  Laterality: N/A;  1250   ESOPHAGOGASTRODUODENOSCOPY N/A 08/11/2022   Procedure: ESOPHAGOGASTRODUODENOSCOPY (EGD);  Surgeon: Jerene Bears, MD;  Location: [facility] ENDOSCOPY;  Service: Gastroenterology;  Laterality: N/A;   ESOPHAGOGASTRODUODENOSCOPY (EGD) WITH PROPOFOL N/A 02/05/2021   Procedure: ESOPHAGOGASTRODUODENOSCOPY (EGD) WITH PROPOFOL;  Surgeon: Eloise Harman, DO;  [facility];  Service: Endoscopy;  Laterality: N/A;   ESOPHAGOGASTRODUODENOSCOPY (EGD) WITH PROPOFOL N/A 08/27/2022   Procedure: ESOPHAGOGASTRODUODENOSCOPY (EGD) WITH PROPOFOL;  Surgeon: Eloise Harman, DO;  [facility];  Service: Endoscopy;  Laterality: N/A;   GIVENS CAPSULE STUDY N/A 03/07/2019   Procedure: GIVENS CAPSULE STUDY;  Surgeon: Rogene Houston, MD;  [facility];  Service: Endoscopy;  Laterality: N/A;  7:30   GIVENS CAPSULE STUDY N/A 04/22/2021   Procedure: GIVENS CAPSULE STUDY;  Surgeon: Rogene Houston, MD;  [facility];  Service: Endoscopy;  Laterality: N/A;  7:30   GIVENS CAPSULE STUDY N/A 08/27/2022   Procedure: GIVENS CAPSULE STUDY;  Surgeon: Eloise Harman, DO;  [facility];  Service: Endoscopy;  Laterality: N/A;   HOT HEMOSTASIS N/A 08/11/2022   Procedure: HOT HEMOSTASIS (ARGON PLASMA COAGULATION/BICAP);  Surgeon: Jerene Bears, MD;  Location: [facility] ENDOSCOPY;  Service: Gastroenterology;  Laterality: N/A;   INSERTION OF DIALYSIS CATHETER N/A 10/05/2020   Procedure: ABORTED TUNNELED DIALYSIS CATHETER PLACEMENT RIGHT INTERNAL JUGULAR VEIN ;  Surgeon: Virl Cagey, MD;  [facility];  Service: General;  Laterality: N/A;   INTRAOPERATIVE TRANSESOPHAGEAL ECHOCARDIOGRAM  06/13/2012   Procedure: INTRAOPERATIVE TRANSESOPHAGEAL ECHOCARDIOGRAM;  Surgeon: Grace Isaac, MD;  Location: [place];  Service: Open Heart Surgery;   Laterality: N/A;   IR DIALY SHUNT INTRO NEEDLE/INTRACATH INITIAL W/IMG LEFT Left 10/06/2020   IR FLUORO GUIDE CV LINE RIGHT  06/17/2020   IR GENERIC HISTORICAL  07/26/2016   IR FLUORO GUIDE CV LINE RIGHT 07/26/2016 Greggory Keen, MD [facility]   IR GENERIC HISTORICAL  07/26/2016   IR US GUIDE VASC ACCESS RIGHT  07/26/2016 Greggory Keen, MD [facility]   IR GENERIC HISTORICAL  08/02/2016   IR US GUIDE VASC ACCESS RIGHT 08/02/2016 Greggory Keen, MD [facility]   IR GENERIC HISTORICAL  08/02/2016   IR FLUORO GUIDE CV LINE RIGHT 08/02/2016 Greggory Keen, MD [facility]   IR RADIOLOGY PERIPHERAL GUIDED IV START  03/28/2017   IR REMOVAL TUN CV CATH W/O FL  08/11/2020   IR THROMBECTOMY AV FISTULA W/THROMBOLYSIS INC/SHUNT/IMG LEFT Left 06/17/2020   IR US GUIDE VASC ACCESS LEFT  06/17/2020   IR US GUIDE VASC ACCESS RIGHT  03/28/2017   IR US GUIDE VASC ACCESS RIGHT  06/17/2020   LEFT HEART CATH AND CORONARY ANGIOGRAPHY N/A 09/20/2016   Procedure: Left Heart Cath and Coronary Angiography;  Surgeon: Shawna Crome, MD;  Location: [place] [facility];  Service: Cardiovascular;  Laterality: N/A;   LEFT HEART CATH AND CORS/GRAFTS ANGIOGRAPHY N/A 10/13/2016   Procedure: Left Heart Cath and Cors/Grafts Angiography;  Surgeon: Troy Sine, MD;  Location: [place] [facility];  Service: Cardiovascular;  Laterality: N/A;   LEFT HEART CATH AND CORS/GRAFTS ANGIOGRAPHY N/A 07/13/2018   Procedure: LEFT HEART CATH AND CORS/GRAFTS ANGIOGRAPHY;  Surgeon: Martinique, Peter M, MD;  Location: [place] [facility];  Service: Cardiovascular;  Laterality: N/A;   LEFT HEART CATH AND CORS/GRAFTS ANGIOGRAPHY N/A 07/22/2021   Procedure: LEFT HEART CATH AND CORS/GRAFTS ANGIOGRAPHY;  Surgeon: Lorretta Harp, MD;  Location: [place] [facility];  Service: Cardiovascular;  Laterality: N/A;   LEFT HEART CATHETERIZATION WITH CORONARY ANGIOGRAM N/A 12/15/2011   Procedure: LEFT HEART CATHETERIZATION WITH CORONARY ANGIOGRAM;  Surgeon: Burnell Blanks,  MD;  Location: [facility] CATH LAB;  Service: Cardiovascular;  Laterality: N/A;   LEFT HEART CATHETERIZATION WITH CORONARY ANGIOGRAM N/A 01/10/2012   Procedure: LEFT HEART CATHETERIZATION WITH CORONARY ANGIOGRAM;  Surgeon: Peter M Martinique, MD;  Location: [facility] CATH LAB;  Service: Cardiovascular;  Laterality: N/A;   LEFT HEART CATHETERIZATION WITH CORONARY ANGIOGRAM N/A 06/08/2012   Procedure: LEFT HEART CATHETERIZATION WITH CORONARY ANGIOGRAM;  Surgeon: Burnell Blanks, MD;  Location: [facility] CATH LAB;  Service: Cardiovascular;  Laterality: N/A;   LEFT HEART CATHETERIZATION WITH CORONARY/GRAFT ANGIOGRAM N/A 12/10/2013   Procedure: LEFT HEART CATHETERIZATION WITH Beatrix Fetters;  Surgeon: [name], MD;  Location: [facility] CATH LAB;  Service: Cardiovascular;  Laterality: N/A;   OVARY SURGERY     ovarian cancer   POLYPECTOMY  03/09/2019   Procedure: POLYPECTOMY;  Surgeon: Rogene Houston, MD;  [facility];  Service: Endoscopy;;  cecal    POLYPECTOMY N/A 09/26/2019   Procedure: DUODENAL POLYPECTOMY;  Surgeon: Rogene Houston, MD;  [facility];  Service: Endoscopy;  Laterality: N/A;   POLYPECTOMY  08/11/2022   Procedure: POLYPECTOMY;  Surgeon: Jerene Bears, MD;  Location: [place];  Service: Gastroenterology;;   REVISION OF ARTERIOVENOUS GORETEX GRAFT N/A 02/24/2017   Procedure: REVISION OF ARTERIOVENOUS GORETEX GRAFT (RESECTION);  Surgeon: Katha Cabal, MD;  Location: [facility];  Service: Vascular;  Laterality: N/A;   REVISON OF ARTERIOVENOUS FISTULA Left 06/19/2020   Procedure: REVISION OF LEFT UPPER ARM AV GRAFT WITH INTERPOSITION JUMP GRAFT USING 6MM GORE LIMB;  Surgeon: Marty Heck, MD;  Location: [place];  Service: Vascular;  Laterality: Left;   SHUNTOGRAM N/A 10/15/2013   Procedure: Fistulogram;  Surgeon: Serafina Mitchell, MD;  Location: [facility] CATH LAB;  Service: Cardiovascular;  Laterality: N/A;   THROMBECTOMY / ARTERIOVENOUS GRAFT REVISION  2011  left  upper arm   TUBAL LIGATION  1980's   UPPER EXTREMITY ANGIOGRAPHY Bilateral 12/06/2016   Procedure: Upper Extremity Angiography;  Surgeon: Katha Cabal, MD;  Location: [place] [facility];  Service: Cardiovascular;  Laterality: Bilateral;   UPPER EXTREMITY INTERVENTION Left 06/06/2017   Procedure: UPPER EXTREMITY INTERVENTION;  Surgeon: Katha Cabal, MD;  Location: [place] [facility];  Service: Cardiovascular;  Laterality: Left;   UPPER EXTREMITY VENOGRAPHY Left 10/04/2022   Procedure: UPPER EXTREMITY VENOGRAPHY;  Surgeon: Katha Cabal, MD;  Location: [place] [facility];  Service: Cardiovascular;  Laterality: Left;    Family History  Problem Relation Age of Onset   Heart disease Mother        Heart Disease before age 42   Hyperlipidemia Mother    Hypertension Mother    Diabetes Mother    Heart attack Mother    Heart disease Father        Heart Disease before age 14   Hyperlipidemia Father    Hypertension Father    Diabetes Father    Diabetes Sister    Hypertension Sister    Diabetes Brother    Hyperlipidemia Brother    Heart attack Brother    Hypertension Sister    Heart attack Brother    Colon cancer Child 66   Other Other        noncontributory for early CAD   Esophageal cancer Neg Hx    Liver disease Neg Hx    Kidney disease Neg Hx    Colon polyps Neg Hx     Allergies as of 10/06/2022 - Review Complete 10/06/2022  Allergen Reaction Noted   Amlodipine Swelling 01/19/2015   Aspirin Other (See Comments) 12/15/2011   Nitrofurantoin Hives 07/08/2009   Bactrim [sulfamethoxazole-trimethoprim] Rash 12/15/2011   Contrast media [iodinated contrast media] Itching 12/15/2011   Iron Itching and Other (See Comments) 05/02/2012   Tylenol [acetaminophen] Itching and Other (See Comments) 07/08/2016   Gabapentin Other (See Comments) 02/08/2017   Iron sucrose Other (See Comments) 04/15/2021   Ranexa [ranolazine] Other (See Comments) 08/30/2020   Sucroferric  oxyhydroxide Other (See Comments) 04/15/2021   Dexilant [dexlansoprazole] Other (See Comments) 05/29/2013   Hydralazine Itching 12/14/2018   Levaquin [levofloxacin in d5w] Rash 10/04/2013   Morphine and related Itching and Other (See Comments) 09/16/2014   Pantoprazole Rash 03/21/2013   Plavix [clopidogrel bisulfate] Rash 01/05/2012   Protonix [pantoprazole sodium] Rash 03/21/2013   Venofer [ferric oxide] Itching and Other (See Comments) 05/02/2012    Social History   Socioeconomic History   Marital status: Married    Spouse name: Jalayiah Einspahr   Number of children: 1   Years of education: Not on file   Highest education level: GED or equivalent  Occupational History   Occupation: retired- Technical sales engineer- [employer]  Tobacco Use   Smoking status: Never   Smokeless tobacco: Never  Vaping Use   Vaping Use: Never used  Substance and Sexual Activity   Alcohol use: No    Alcohol/week: 0.0 standard drinks of alcohol   Drug use: No   Sexual activity: Not Currently    Birth control/protection: Surgical  Other Topics Concern   Not on file  Social History Narrative   Lives in [place], [place] with husband.  Dialysis pt - [facility].   Social Determinants of Health   Financial Resource Strain: Low Risk  (12/14/2018)   Overall Financial Resource Strain (CARDIA)    Difficulty of Paying Living Expenses: Not very  hard  Food Insecurity: No Food Insecurity (08/06/2022)   Hunger Vital Sign    Worried About Running Out of Food in the Last Year: Never true    Ran Out of Food in the Last Year: Never true  Transportation Needs: No Transportation Needs (08/06/2022)   PRAPARE - Hydrologist (Medical): No    Lack of Transportation (Non-Medical): No  Physical Activity: Unknown (12/14/2018)   Exercise Vital Sign    Days of Exercise per Week: Patient declined    Minutes of Exercise per Session: Patient declined  Stress: No Stress Concern Present (12/14/2018)   [place]    Feeling of Stress : Only a little  Social Connections: Unknown (12/14/2018)   Social Connection and Isolation Panel [NHANES]    Frequency of Communication with Friends and Family: Patient declined    Frequency of Social Gatherings with Friends and Family: Patient declined    Attends Religious Services: Patient declined    Marine scientist or Organizations: Patient declined    Attends Archivist Meetings: Patient declined    Marital Status: Patient declined     Review of Systems   Gen: + Fatigue.  Denies fever, chills, anorexia. Denies weakness, weight loss.  CV: + dizziness.  Denies chest pain, palpitations, syncope, peripheral edema, and claudication. Resp: + Shortness of breath and cough.  Denies  wheezing, coughing up blood, and pleurisy. GI: See HPI Derm: Denies rash, itching, dry skin Psych: Denies depression, anxiety, memory loss, confusion. No homicidal or suicidal ideation.  Heme: Denies bruising, bleeding, and enlarged lymph nodes.   Physical Exam   BP 139/71 (BP Location: Right Arm, Patient Position: Sitting, Cuff Size: Normal)   Pulse 66   Temp 97.8 F (36.6 C) (Temporal)   Ht 5\' 2"  (1.575 m)   Wt 118 lb 14.4 oz (53.9 kg)   BMI 21.75 kg/m   General:   Alert and oriented. No distress noted. Pleasant and cooperative.  Head:  Normocephalic and atraumatic. Eyes:  Conjuctiva clear without scleral icterus. Mouth:  Oral mucosa pink and moist. Good dentition. No lesions. Lungs: Rhonchi to bilateral upper lobes.  No tachypnea. Heart:  S1, S2 present without murmurs appreciated.  Abdomen:  +BS, soft, non-tender and non-distended. No rebound or guarding. No HSM or masses noted. Rectal: deferred Msk: Sitting in wheelchair, unable to assess gait. Extremities:  Without edema. Neurologic:  Alert and  oriented x4 Psych:  Alert and cooperative. Normal mood and affect.   Assessment  Shawna [name] is a 82 y.o. female with a history of dementia, ESRD on HD, Afib on ASA, CAD, heart failure, small bowel AVMs, recurrent GI bleeding, colon cancer s/p resection in 1992, IDA, duodenal adenoma presenting today for hospital follow up.   Acute on chronic anemia, small bowel AVMs: Most recent hemoglobin 10.6 on 09/19/2022.  Having shortness of breath and some intermittent dizziness.  Has history of intermittent dizziness/vertigo.  Has chronic fatigue given ESRD on dialysis and anemia.  Denies any chest pain.  Has good appetite, no pica.  Denies any melena or BRBPR.  Prior endoscopic workup as outlined in HPI.  She has had evidence of gastric and small bowel AVMs as well as duodenal polyps in the past.  Continues to receive B12 injections.  Anemia is of chronic disease with acute exacerbations likely from small bowel AVMs.  Given some dizziness today we will recheck CBC.  If hemoglobin  is downtrending from prior known will consider octreotide injections.  Discussed this with patient and her husband today.  She would prefer to take oral iron supplementation rather than injection if possible.  Explained that iron supplementation can help maintain hemoglobin but will not prevent recurrence of AVMs.  Remains on once daily PPI.  Follows regularly with nephrology and has dialysis M, W, F.  Chronic diarrhea: Has intermittent episodes of diarrhea however this is chronic for her.  Has had up to as many as 8 loose stools per day in the past, most notably after capsule study.  Uses Imodium as needed.  Advised to continue this.  PLAN   Continue omeprazole 40 mg once daily.  Refill today CBC today Consider octreotide if down trending Hgb.  Continue to follow with nephrology Continue B12 injections.  Okay with daily iron supplementation. Continue Imodium as needed Follow up in 2 months.     Venetia Night, MSN, FNP-BC, AGACNP-BC [facility]  I have reviewed the note and agree with  the APP's assessment as described in this progress note  Patient well known to me. May consider octreotide for recurrent anemia, but if anemia worsens, may need to repeat push enteroscopy and colonoscopy with neo TI intubation. Patient had a difficult IV access in the past (multiple attempts to canalize per anesthesia) and could not have endoscopic procedures in the past.  [name], MD [facility]

## 2022-10-06 ENCOUNTER — Ambulatory Visit (INDEPENDENT_AMBULATORY_CARE_PROVIDER_SITE_OTHER): Payer: Medicare HMO | Admitting: Gastroenterology

## 2022-10-06 ENCOUNTER — Encounter: Payer: Self-pay | Admitting: Gastroenterology

## 2022-10-06 VITALS — BP 139/71 | HR 66 | Temp 97.8°F | Ht 62.0 in | Wt 118.9 lb

## 2022-10-06 DIAGNOSIS — Z860100 Personal history of colon polyps, unspecified: Secondary | ICD-10-CM

## 2022-10-06 DIAGNOSIS — K5520 Angiodysplasia of colon without hemorrhage: Secondary | ICD-10-CM

## 2022-10-06 DIAGNOSIS — D509 Iron deficiency anemia, unspecified: Secondary | ICD-10-CM

## 2022-10-06 DIAGNOSIS — D500 Iron deficiency anemia secondary to blood loss (chronic): Secondary | ICD-10-CM | POA: Diagnosis not present

## 2022-10-06 DIAGNOSIS — K529 Noninfective gastroenteritis and colitis, unspecified: Secondary | ICD-10-CM | POA: Diagnosis not present

## 2022-10-06 MED ORDER — OMEPRAZOLE 40 MG PO CPDR: 40.0000 mg | DELAYED_RELEASE_CAPSULE | Freq: Every day | ORAL | 3 refills | Status: DC

## 2022-10-06 NOTE — Patient Instructions (Addendum)
Please have blood work completed at [facility].  We will call you with results once they have been received. Please allow 3-5 business days for review. 2 locations for [facility] in [place]:              1. [place] [address] [place]              [address]. [address] [place]   Continue getting her B12 injections.  Start taking a daily oral iron supplement.  Once I receive the results of your blood work I will be in touch with any further recommendations.  Please asked dialysis if they will perform this.  If not I want you to go to the lab on Monday to have your CBC taken.  Please ask your kidney doctor about your Sensipar that you are out of!  Continue Imodium as needed  I will see for follow-up in 2 months, sooner if needed.  It was a pleasure to see you today. I want to create trusting relationships with patients. If you receive a survey regarding your visit,  I greatly appreciate you taking time to fill this out on paper or through your MyChart. I value your feedback.  Venetia Night, MSN, FNP-BC, AGACNP-BC [facility]

## 2022-10-19 ENCOUNTER — Other Ambulatory Visit: Payer: Self-pay

## 2022-10-19 ENCOUNTER — Emergency Department (HOSPITAL_COMMUNITY)
Admission: EM | Admit: 2022-10-19 | Discharge: 2022-10-19 | Disposition: A | Discharge status: Home / Self Care | Payer: Medicare Other | Attending: Emergency Medicine | Admitting: Emergency Medicine

## 2022-10-19 DIAGNOSIS — J40 Bronchitis, not specified as acute or chronic: Secondary | ICD-10-CM | POA: Insufficient documentation

## 2022-10-19 DIAGNOSIS — Z7982 Long term (current) use of aspirin: Secondary | ICD-10-CM | POA: Insufficient documentation

## 2022-10-19 DIAGNOSIS — Z1152 Encounter for screening for COVID-19: Secondary | ICD-10-CM | POA: Diagnosis not present

## 2022-10-19 DIAGNOSIS — N186 End stage renal disease: Secondary | ICD-10-CM | POA: Insufficient documentation

## 2022-10-19 DIAGNOSIS — R0981 Nasal congestion: Secondary | ICD-10-CM | POA: Diagnosis present

## 2022-10-19 DIAGNOSIS — Z992 Dependence on renal dialysis: Secondary | ICD-10-CM | POA: Insufficient documentation

## 2022-10-19 LAB — CBC WITH DIFFERENTIAL/PLATELET
Abs Immature Granulocytes: 0.02 10*3/uL (ref 0.00–0.07)
Basophils Absolute: 0.1 10*3/uL (ref 0.0–0.1)
Basophils Relative: 1 %
Eosinophils Absolute: 0.5 10*3/uL (ref 0.0–0.5)
Eosinophils Relative: 7 %
HCT: 24.3 % — ABNORMAL LOW (ref 36.0–46.0)
Hemoglobin: 7.9 g/dL — ABNORMAL LOW (ref 12.0–15.0)
Immature Granulocytes: 0 %
Lymphocytes Relative: 9 %
Lymphs Abs: 0.6 10*3/uL — ABNORMAL LOW (ref 0.7–4.0)
MCH: 37.8 pg — ABNORMAL HIGH (ref 26.0–34.0)
MCHC: 32.5 g/dL (ref 30.0–36.0)
MCV: 116.3 fL — ABNORMAL HIGH (ref 80.0–100.0)
Monocytes Absolute: 0.8 10*3/uL (ref 0.1–1.0)
Monocytes Relative: 12 %
Neutro Abs: 4.9 10*3/uL (ref 1.7–7.7)
Neutrophils Relative %: 71 %
Platelets: 264 10*3/uL (ref 150–400)
RBC: 2.09 MIL/uL — ABNORMAL LOW (ref 3.87–5.11)
RDW: 23.2 % — ABNORMAL HIGH (ref 11.5–15.5)
WBC: 6.9 10*3/uL (ref 4.0–10.5)
nRBC: 0 % (ref 0.0–0.2)

## 2022-10-19 LAB — BASIC METABOLIC PANEL
Anion gap: 15 (ref 5–15)
BUN: 57 mg/dL — ABNORMAL HIGH (ref 8–23)
CO2: 26 mmol/L (ref 22–32)
Calcium: 8.1 mg/dL — ABNORMAL LOW (ref 8.9–10.3)
Chloride: 91 mmol/L — ABNORMAL LOW (ref 98–111)
Creatinine, Ser: 7.27 mg/dL — ABNORMAL HIGH (ref 0.44–1.00)
GFR, Estimated: 5 mL/min — ABNORMAL LOW (ref >60)
Glucose, Bld: 99 mg/dL (ref 70–99)
Potassium: 4.4 mmol/L (ref 3.5–5.1)
Sodium: 132 mmol/L — ABNORMAL LOW (ref 135–145)

## 2022-10-19 LAB — RESP PANEL BY RT-PCR (RSV, FLU A&B, COVID)  RVPGX2
Influenza A by PCR: NEGATIVE
Influenza B by PCR: NEGATIVE
Resp Syncytial Virus by PCR: NEGATIVE
SARS Coronavirus 2 by RT PCR: NEGATIVE

## 2022-10-19 MED ORDER — AEROCHAMBER PLUS FLO-VU MEDIUM MISC
1.0000 | Freq: Once | Status: AC
  Administered 2022-10-19: 1

## 2022-10-19 MED ORDER — ALBUTEROL SULFATE HFA 108 (90 BASE) MCG/ACT IN AERS
2.0000 | INHALATION_SPRAY | Freq: Once | RESPIRATORY_TRACT | Status: AC
  Administered 2022-10-19: 2 via RESPIRATORY_TRACT
  Filled 2022-10-19: qty 6.7

## 2022-10-19 MED ORDER — METHYLPREDNISOLONE 4 MG PO TBPK: ORAL_TABLET | ORAL | 0 refills | Status: DC

## 2022-10-19 NOTE — ED Provider Notes (Signed)
[place] EMERGENCY DEPARTMENT AT [facility] Provider Note   CSN: [id number] Arrival date & time: 10/19/22  1250     History  Chief Complaint  Patient presents with   Cough   Nasal Congestion    Shawna [name] is a 82 y.o. female.  Who presents the emergency department with a chief complaint of cough.  She was seen for the same at an urgent care 2 weeks ago.  She had a chest x-ray that showed bronchitis.  She was treated with Jerilynn Som and an antibiotic.  She states that her cough is no better despite taking over-the-counter Robitussin along with Tessalon and feels like it is worse at night.  She denies any fevers.  She is coughing up clear plegm.  She also complains of nodule to the left of her head that has been there for some time and is recently gotten a little bit larger.  He has no other complaints at this time patient has end-stage renal disease and did not go to dialysis Tuesday because she did not feel good.  Been on dialysis for 12 years   Cough      Home Medications Prior to Admission medications   Medication Sig Start Date End Date Taking? Authorizing Provider  methylPREDNISolone (MEDROL DOSEPAK) 4 MG TBPK tablet Use as directed 10/19/22  Yes Lasheka Kempner, PA-C  ALPRAZolam Prudy Feeler) 0.5 MG tablet Take 1 tablet (0.5 mg total) by mouth daily as needed for anxiety. 08/17/22   Jerald Kief, MD  amiodarone (PACERONE) 100 MG tablet Take 1 tablet (100 mg total) by mouth daily. 04/27/22   Rhetta Mura, MD  Aspirin 81 MG CAPS Take 1 capsule by mouth daily at 12 noon.    [provider]  azithromycin (ZITHROMAX) 250 MG tablet Take 1 tablet (250 mg total) by mouth daily. Take first 2 tablets together, then 1 every day until finished. 10/02/22   Vanetta Mulders, MD  benzonatate (TESSALON) 100 MG capsule Take 100 mg by mouth 3 (three) times daily as needed for cough. 05/10/22   [provider]  folic acid (FOLVITE) 1 MG tablet Take 1 mg by  mouth daily. 05/24/22   [provider]  hydrOXYzine (ATARAX) 25 MG tablet Take 25 mg by mouth every 6 (six) hours as needed for itching. 06/06/22   [provider]  levothyroxine (SYNTHROID) 75 MCG tablet Take 1 tablet (75 mcg total) by mouth daily before breakfast. 08/14/22 10/06/22  Hughie Closs, MD  lisinopril (ZESTRIL) 20 MG tablet Take 1 tablet (20 mg total) by mouth daily. 08/18/22 10/06/22  Jerald Kief, MD  metoprolol tartrate (LOPRESSOR) 25 MG tablet Take 0.5 tablets (12.5 mg total) by mouth 2 (two) times daily. 03/04/22   Antoine Poche, MD  multivitamin (RENA-VIT) TABS tablet Take 1 tablet by mouth daily.    [provider]  nitroGLYCERIN (NITROSTAT) 0.4 MG SL tablet Place 0.4 mg under the tongue every 5 (five) minutes as needed for chest pain.    [provider]  omeprazole (PRILOSEC) 40 MG capsule Take 1 capsule (40 mg total) by mouth daily. 10/06/22   Aida Raider, NP  ondansetron (ZOFRAN-ODT) 4 MG disintegrating tablet Take 4 mg by mouth every 8 (eight) hours as needed for nausea or vomiting. 05/05/22   [provider]  rosuvastatin (CRESTOR) 10 MG tablet Take 1 tablet by mouth once daily 06/06/22   Iran Ouch, [place] M, PA-C  sevelamer carbonate (RENVELA) 800 MG tablet Take 3,200 mg  by mouth in the morning and at bedtime. 12/27/21   [provider]      Allergies    Amlodipine, Aspirin, Nitrofurantoin, Bactrim [sulfamethoxazole-trimethoprim], Contrast media [iodinated contrast media], Iron, Tylenol [acetaminophen], Gabapentin, Iron sucrose, Ranexa [ranolazine], Sucroferric oxyhydroxide, Dexilant [dexlansoprazole], Hydralazine, Levaquin [levofloxacin in d5w], Morphine and related, Pantoprazole, Plavix [clopidogrel bisulfate], Protonix [pantoprazole sodium], and Venofer [ferric oxide]    Review of Systems   Review of Systems  Respiratory:  Positive for cough.     Physical Exam Updated Vital Signs BP (!) 141/51 (BP  Location: Right Arm)   Pulse (!) 56   Temp 97.8 F (36.6 C) (Oral)   Resp 18   Ht 5\' 5"  (1.651 m)   Wt 59 kg   SpO2 95%   BMI 21.63 kg/m  Physical Exam Vitals and nursing note reviewed.  Constitutional:      General: She is not in acute distress.    Appearance: She is well-developed. She is not diaphoretic.  HENT:     Head: Normocephalic and atraumatic.     Right Ear: External ear normal.     Left Ear: External ear normal.     Nose: Nose normal.     Mouth/Throat:     Mouth: Mucous membranes are moist.  Eyes:     General: No scleral icterus.    Conjunctiva/sclera: Conjunctivae normal.  Cardiovascular:     Rate and Rhythm: Normal rate and regular rhythm.     Heart sounds: Normal heart sounds. No murmur heard.    No friction rub. No gallop.  Pulmonary:     Effort: Pulmonary effort is normal. No respiratory distress.     Breath sounds: Rhonchi present.     Comments: Rhonchi clear with cough Abdominal:     General: Bowel sounds are normal. There is no distension.     Palpations: Abdomen is soft. There is no mass.     Tenderness: There is no abdominal tenderness. There is no guarding.  Musculoskeletal:     Cervical back: Normal range of motion.  Skin:    General: Skin is warm and dry.  Neurological:     Mental Status: She is alert and oriented to person, place, and time.  Psychiatric:        Behavior: Behavior normal.     ED Results / Procedures / Treatments   Labs (all labs ordered are listed, but only abnormal results are displayed) Labs Reviewed  BASIC METABOLIC PANEL - Abnormal; Notable for the following components:      Result Value   Sodium 132 (*)    Chloride 91 (*)    BUN 57 (*)    Creatinine, Ser 7.27 (*)    Calcium 8.1 (*)    GFR, Estimated 5 (*)    All other components within normal limits  CBC WITH DIFFERENTIAL/PLATELET - Abnormal; Notable for the following components:   RBC 2.09 (*)    Hemoglobin 7.9 (*)    HCT 24.3 (*)    MCV 116.3 (*)    MCH  37.8 (*)    RDW 23.2 (*)    Lymphs Abs 0.6 (*)    All other components within normal limits  RESP PANEL BY RT-PCR (RSV, FLU A&B, COVID)  [id number]    EKG None  Radiology No results found.  Procedures Procedures    Medications Ordered in ED Medications  albuterol (VENTOLIN HFA) 108 (90 Base) MCG/ACT inhaler 2 puff (2 puffs Inhalation Given 10/19/22 1605)  AeroChamber Plus Flo-Vu Medium MISC  1 each (1 each Other Given 10/19/22 1605)    ED Course/ Medical Decision Making/ A&P Clinical Course as of 10/19/22 1944  Wed Oct 19, 2022  1525 Potassium: 4.4 [AH]    Clinical Course User Index [AH] Arthor Captain, PA-C                             Medical Decision Making 82 year old female here with persistent cough.  She denies shortness of breath, wheezing, fevers.  Seems to be worse at night.  She had a chest x-ray that showed bronchitis recently.  I personally visualized and interpreted previous x-ray.  I ordered labs which shows baseline renal failure without hyperkalemia.  She is not diabetic.  Will discharge with albuterol and Medrol Dosepak for bronchospasm.  Patient appears otherwise appropriate for discharge at this time.  Follow-up with PCP.  Amount and/or Complexity of Data Reviewed Labs: ordered. Decision-making details documented in ED Course.  Risk Prescription drug management.           Final Clinical Impression(s) / ED Diagnoses Final diagnoses:  Bronchitis    Rx / DC Orders ED Discharge Orders          Ordered    methylPREDNISolone (MEDROL DOSEPAK) 4 MG TBPK tablet        10/19/22 1650              Arthor Captain, PA-C 10/19/22 1945    Gerhard Munch, MD 10/24/22 [phone]

## 2022-10-19 NOTE — Discharge Instructions (Addendum)
Use 1-2 puffs of a albuterol inhaler every 4-6 hours for coughing. Take the medrol dosepak as directed. Follow up with your primary care doctor  Contact a health care provider if: Your symptoms do not improve after 2 weeks. You have trouble coughing up the mucus. Your cough keeps you awake at night. You have a fever. Get help right away if you: Cough up blood. Feel pain in your chest. Have severe shortness of breath. Faint or keep feeling like you are going to faint. Have a severe headache. Have a fever or chills that get worse. These symptoms may represent a serious problem that is an emergency. Do not wait to see if the symptoms will go away. Get medical help right away. Call your local emergency services (911 in the [place] Do not drive yourself to the hospital.

## 2022-10-19 NOTE — ED Triage Notes (Signed)
Pt complains of cough and runny nose x 1 week. Denies SOB and Denies Pain. Pt states last night could not stop coughing.

## 2022-10-23 ENCOUNTER — Emergency Department (HOSPITAL_COMMUNITY): Payer: Medicare Other

## 2022-10-23 ENCOUNTER — Encounter (HOSPITAL_COMMUNITY): Payer: Self-pay | Admitting: Emergency Medicine

## 2022-10-23 ENCOUNTER — Emergency Department (HOSPITAL_COMMUNITY)
Admission: EM | Admit: 2022-10-23 | Discharge: 2022-10-23 | Disposition: A | Discharge status: Home / Self Care | Payer: Medicare Other | Attending: Emergency Medicine | Admitting: Emergency Medicine

## 2022-10-23 ENCOUNTER — Other Ambulatory Visit: Payer: Self-pay

## 2022-10-23 DIAGNOSIS — J189 Pneumonia, unspecified organism: Secondary | ICD-10-CM | POA: Insufficient documentation

## 2022-10-23 DIAGNOSIS — N186 End stage renal disease: Secondary | ICD-10-CM | POA: Insufficient documentation

## 2022-10-23 DIAGNOSIS — Z7982 Long term (current) use of aspirin: Secondary | ICD-10-CM | POA: Insufficient documentation

## 2022-10-23 DIAGNOSIS — I509 Heart failure, unspecified: Secondary | ICD-10-CM | POA: Diagnosis not present

## 2022-10-23 DIAGNOSIS — Z79899 Other long term (current) drug therapy: Secondary | ICD-10-CM | POA: Diagnosis not present

## 2022-10-23 DIAGNOSIS — I132 Hypertensive heart and chronic kidney disease with heart failure and with stage 5 chronic kidney disease, or end stage renal disease: Secondary | ICD-10-CM | POA: Diagnosis not present

## 2022-10-23 DIAGNOSIS — L0201 Cutaneous abscess of face: Secondary | ICD-10-CM | POA: Diagnosis not present

## 2022-10-23 DIAGNOSIS — Z8543 Personal history of malignant neoplasm of ovary: Secondary | ICD-10-CM | POA: Insufficient documentation

## 2022-10-23 DIAGNOSIS — I2510 Atherosclerotic heart disease of native coronary artery without angina pectoris: Secondary | ICD-10-CM | POA: Insufficient documentation

## 2022-10-23 DIAGNOSIS — R059 Cough, unspecified: Secondary | ICD-10-CM | POA: Diagnosis present

## 2022-10-23 DIAGNOSIS — Z85038 Personal history of other malignant neoplasm of large intestine: Secondary | ICD-10-CM | POA: Insufficient documentation

## 2022-10-23 DIAGNOSIS — Z992 Dependence on renal dialysis: Secondary | ICD-10-CM | POA: Diagnosis not present

## 2022-10-23 DIAGNOSIS — Z20822 Contact with and (suspected) exposure to covid-19: Secondary | ICD-10-CM | POA: Insufficient documentation

## 2022-10-23 LAB — CBC WITH DIFFERENTIAL/PLATELET
Abs Immature Granulocytes: 0 10*3/uL (ref 0.00–0.07)
Basophils Absolute: 0 10*3/uL (ref 0.0–0.1)
Basophils Relative: 0 %
Eosinophils Absolute: 0.3 10*3/uL (ref 0.0–0.5)
Eosinophils Relative: 4 %
HCT: 26.8 % — ABNORMAL LOW (ref 36.0–46.0)
Hemoglobin: 8.5 g/dL — ABNORMAL LOW (ref 12.0–15.0)
Lymphocytes Relative: 9 %
Lymphs Abs: 0.6 10*3/uL — ABNORMAL LOW (ref 0.7–4.0)
MCH: 38.1 pg — ABNORMAL HIGH (ref 26.0–34.0)
MCHC: 31.7 g/dL (ref 30.0–36.0)
MCV: 120.2 fL — ABNORMAL HIGH (ref 80.0–100.0)
Monocytes Absolute: 0.5 10*3/uL (ref 0.1–1.0)
Monocytes Relative: 7 %
Neutro Abs: 5.4 10*3/uL (ref 1.7–7.7)
Neutrophils Relative %: 80 %
Platelets: 309 10*3/uL (ref 150–400)
RBC: 2.23 MIL/uL — ABNORMAL LOW (ref 3.87–5.11)
RDW: 23.4 % — ABNORMAL HIGH (ref 11.5–15.5)
WBC: 6.8 10*3/uL (ref 4.0–10.5)
nRBC: 0 % (ref 0.0–0.2)

## 2022-10-23 LAB — BASIC METABOLIC PANEL
Anion gap: 16 — ABNORMAL HIGH (ref 5–15)
BUN: 44 mg/dL — ABNORMAL HIGH (ref 8–23)
CO2: 23 mmol/L (ref 22–32)
Calcium: 9.4 mg/dL (ref 8.9–10.3)
Chloride: 101 mmol/L (ref 98–111)
Creatinine, Ser: 6.19 mg/dL — ABNORMAL HIGH (ref 0.44–1.00)
GFR, Estimated: 6 mL/min — ABNORMAL LOW (ref >60)
Glucose, Bld: 108 mg/dL — ABNORMAL HIGH (ref 70–99)
Potassium: 4.9 mmol/L (ref 3.5–5.1)
Sodium: 140 mmol/L (ref 135–145)

## 2022-10-23 LAB — RESP PANEL BY RT-PCR (RSV, FLU A&B, COVID)  RVPGX2
Influenza A by PCR: NEGATIVE
Influenza B by PCR: NEGATIVE
Resp Syncytial Virus by PCR: NEGATIVE
SARS Coronavirus 2 by RT PCR: NEGATIVE

## 2022-10-23 LAB — BRAIN NATRIURETIC PEPTIDE: B Natriuretic Peptide: 2205 pg/mL — ABNORMAL HIGH (ref 0.0–100.0)

## 2022-10-23 MED ORDER — LIDOCAINE-EPINEPHRINE (PF) 2 %-1:200000 IJ SOLN
10.0000 mL | Freq: Once | INTRAMUSCULAR | Status: AC
  Administered 2022-10-23: 10 mL
  Filled 2022-10-23: qty 20

## 2022-10-23 MED ORDER — SODIUM CHLORIDE 0.9 % IV SOLN
1.0000 g | Freq: Once | INTRAVENOUS | Status: AC
  Administered 2022-10-23: 1 g via INTRAVENOUS
  Filled 2022-10-23: qty 10

## 2022-10-23 MED ORDER — DOXYCYCLINE HYCLATE 100 MG PO CAPS: 100.0000 mg | ORAL_CAPSULE | Freq: Two times a day (BID) | ORAL | 0 refills | Status: DC

## 2022-10-23 MED ORDER — GUAIFENESIN-CODEINE 100-10 MG/5ML PO SOLN
5.0000 mL | Freq: Once | ORAL | Status: AC
  Administered 2022-10-23: 5 mL via ORAL
  Filled 2022-10-23: qty 5

## 2022-10-23 MED ORDER — DOXYCYCLINE HYCLATE 100 MG PO TABS
100.0000 mg | ORAL_TABLET | Freq: Once | ORAL | Status: AC
  Administered 2022-10-23: 100 mg via ORAL
  Filled 2022-10-23: qty 1

## 2022-10-23 MED ORDER — GUAIFENESIN-CODEINE 100-10 MG/5ML PO SOLN: 5.0000 mL | ORAL | 0 refills | Status: DC | PRN

## 2022-10-23 NOTE — ED Provider Notes (Signed)
[place] EMERGENCY DEPARTMENT AT [facility] Provider Note   CSN: [id number] Arrival date & time: 10/23/22  3244     History  Chief Complaint  Patient presents with   Cough    Shawna [name] is a 82 y.o. female.  Pt is a 82 yo female with pmhx significant for ESRD on HD (MWF), GERD, PUD, GIB, CAD, HLD, anemia, anxiety, HTN, AVM colon, ovarian and colon cancer, NSVT, p. Afib (not on thinners), and CHF.  Pt has had a cough for several days.  She did come to the ED on 4/10.  She was d/c with a medrol dose pack.  This has not helped.  Pt did take an antibiotic that was rx'd by UC, but it has not helped.  She's also taken robitussin and tessalon perles.  Pt is also concerned about a knot to her left forehead.         Home Medications Prior to Admission medications   Medication Sig Start Date End Date Taking? Authorizing Provider  doxycycline (VIBRAMYCIN) 100 MG capsule Take 1 capsule (100 mg total) by mouth 2 (two) times daily. 10/23/22  Yes Jacalyn Lefevre, MD  guaiFENesin-codeine 100-10 MG/5ML syrup Take 5 mLs by mouth every 4 (four) hours as needed for cough. 10/23/22  Yes Jacalyn Lefevre, MD  ALPRAZolam Prudy Feeler) 0.5 MG tablet Take 1 tablet (0.5 mg total) by mouth daily as needed for anxiety. 08/17/22   Jerald Kief, MD  amiodarone (PACERONE) 100 MG tablet Take 1 tablet (100 mg total) by mouth daily. 04/27/22   Rhetta Mura, MD  Aspirin 81 MG CAPS Take 1 capsule by mouth daily at 12 noon.    [provider]  azithromycin (ZITHROMAX) 250 MG tablet Take 1 tablet (250 mg total) by mouth daily. Take first 2 tablets together, then 1 every day until finished. 10/02/22   Vanetta Mulders, MD  benzonatate (TESSALON) 100 MG capsule Take 100 mg by mouth 3 (three) times daily as needed for cough. 05/10/22   [provider]  folic acid (FOLVITE) 1 MG tablet Take 1 mg by mouth daily. 05/24/22   [provider]  hydrOXYzine (ATARAX) 25 MG tablet Take 25 mg  by mouth every 6 (six) hours as needed for itching. 06/06/22   [provider]  levothyroxine (SYNTHROID) 75 MCG tablet Take 1 tablet (75 mcg total) by mouth daily before breakfast. 08/14/22 10/06/22  Hughie Closs, MD  lisinopril (ZESTRIL) 20 MG tablet Take 1 tablet (20 mg total) by mouth daily. 08/18/22 10/06/22  Jerald Kief, MD  methylPREDNISolone (MEDROL DOSEPAK) 4 MG TBPK tablet Use as directed 10/19/22   Arthor Captain, PA-C  metoprolol tartrate (LOPRESSOR) 25 MG tablet Take 0.5 tablets (12.5 mg total) by mouth 2 (two) times daily. 03/04/22   Antoine Poche, MD  multivitamin (RENA-VIT) TABS tablet Take 1 tablet by mouth daily.    [provider]  nitroGLYCERIN (NITROSTAT) 0.4 MG SL tablet Place 0.4 mg under the tongue every 5 (five) minutes as needed for chest pain.    [provider]  omeprazole (PRILOSEC) 40 MG capsule Take 1 capsule (40 mg total) by mouth daily. 10/06/22   Aida Raider, NP  ondansetron (ZOFRAN-ODT) 4 MG disintegrating tablet Take 4 mg by mouth every 8 (eight) hours as needed for nausea or vomiting. 05/05/22   [provider]  rosuvastatin (CRESTOR) 10 MG tablet Take 1 tablet by mouth once daily 06/06/22   Iran Ouch, [place] M, PA-C  sevelamer carbonate (  RENVELA) 800 MG tablet Take 3,200 mg by mouth in the morning and at bedtime. 12/27/21   [provider]      Allergies    Amlodipine, Aspirin, Nitrofurantoin, Bactrim [sulfamethoxazole-trimethoprim], Contrast media [iodinated contrast media], Iron, Tylenol [acetaminophen], Gabapentin, Iron sucrose, Ranexa [ranolazine], Sucroferric oxyhydroxide, Dexilant [dexlansoprazole], Hydralazine, Levaquin [levofloxacin in d5w], Morphine and related, Pantoprazole, Plavix [clopidogrel bisulfate], Protonix [pantoprazole sodium], and Venofer [ferric oxide]    Review of Systems   Review of Systems  Respiratory:  Positive for cough and shortness of breath.   All other systems reviewed and are  negative.   Physical Exam Updated Vital Signs BP (!) 186/77   Pulse 80   Temp 98.3 F (36.8 C) (Oral)   Ht 5\' 5"  (1.651 m)   Wt 58 kg   SpO2 96%   BMI 21.28 kg/m  Physical Exam Vitals and nursing note reviewed.  Constitutional:      Appearance: Normal appearance.  HENT:     Head: Normocephalic and atraumatic.     Comments: Abscess left forehead    Right Ear: External ear normal.     Left Ear: External ear normal.     Nose: Nose normal.     Mouth/Throat:     Mouth: Mucous membranes are moist.     Pharynx: Oropharynx is clear.  Eyes:     Extraocular Movements: Extraocular movements intact.     Conjunctiva/sclera: Conjunctivae normal.     Pupils: Pupils are equal, round, and reactive to light.  Cardiovascular:     Rate and Rhythm: Normal rate and regular rhythm.     Pulses: Normal pulses.     Heart sounds: Normal heart sounds.  Pulmonary:     Effort: Pulmonary effort is normal.     Breath sounds: Normal breath sounds.  Chest:     Comments: Dialysis catheter left upper chest Abdominal:     General: Abdomen is flat. Bowel sounds are normal.     Palpations: Abdomen is soft.  Musculoskeletal:        General: Normal range of motion.     Cervical back: Normal range of motion and neck supple.  Skin:    General: Skin is warm.     Capillary Refill: Capillary refill takes less than 2 seconds.  Neurological:     General: No focal deficit present.     Mental Status: She is alert and oriented to person, place, and time.  Psychiatric:        Mood and Affect: Mood normal.        Behavior: Behavior normal.     ED Results / Procedures / Treatments   Labs (all labs ordered are listed, but only abnormal results are displayed) Labs Reviewed  BASIC METABOLIC PANEL - Abnormal; Notable for the following components:      Result Value   Glucose, Bld 108 (*)    BUN 44 (*)    Creatinine, Ser 6.19 (*)    GFR, Estimated 6 (*)    Anion gap 16 (*)    All other components within  normal limits  BRAIN NATRIURETIC PEPTIDE - Abnormal; Notable for the following components:   B Natriuretic Peptide 2,205.0 (*)    All other components within normal limits  CBC WITH DIFFERENTIAL/PLATELET - Abnormal; Notable for the following components:   RBC 2.23 (*)    Hemoglobin 8.5 (*)    HCT 26.8 (*)    MCV 120.2 (*)    MCH 38.1 (*)    RDW 23.4 (*)  Lymphs Abs 0.6 (*)    All other components within normal limits  RESP PANEL BY RT-PCR (RSV, FLU A&B, COVID)  [id number]    EKG EKG Interpretation  Date/Time:  Sunday October 23 2022 12:48:09 EDT Ventricular Rate:  73 PR Interval:  179 QRS Duration: 136 QT Interval:  480 QTC Calculation: 529 R Axis:   8 Text Interpretation: Sinus rhythm Left bundle branch block No significant change since last tracing Confirmed by Jacalyn Lefevre [phone]) on 10/23/2022 2:32:43 PM  Radiology CT Chest Wo Contrast  Result Date: 10/23/2022 CLINICAL DATA:  Pneumonia, complication suspected, xray done EXAM: CT CHEST WITHOUT CONTRAST TECHNIQUE: Multidetector CT imaging of the chest was performed following the standard protocol without IV contrast. RADIATION DOSE REDUCTION: This exam was performed according to the departmental dose-optimization program which includes automated exposure control, adjustment of the mA and/or kV according to patient size and/or use of iterative reconstruction technique. COMPARISON:  Radiograph earlier today. Additional prior radiographs reviewed. Most recent chest CT available 07/16/2021 FINDINGS: Cardiovascular: Left-sided dialysis catheter tip is in the atrial caval junction. Mild cardiomegaly. Post CABG with calcification of native coronary arteries. Decreased density of the blood pool typical of anemia. Advanced aortic atherosclerosis with aortic tortuosity. No aneurysm or periaortic stranding. No pericardial effusion. Mediastinum/Nodes: 14 mm lower anterior paratracheal node, similar to 2023 exam. Scattered additional shoddy  mediastinal lymph nodes are also unchanged. No progressive adenopathy. Patulous esophagus. Lungs/Pleura: Small bilateral pleural effusions, left slightly larger than right. Slight vascular congestion. Minimal fluid in the fissures. Mild patchy ground-glass opacity in the dependent left greater than right lower lobe. Moderate central bronchial thickening which may be congestive or bronchitic. no endobronchial debris. No pulmonary mass. Upper Abdomen: Sequela of chronic renal disease with renal atrophy and cysts. No specific imaging follow-up of these cysts is recommended. A peripherally calcified density in the right upper kidney is stable from 2023 exam and consistent with a complex cyst, also needing no further follow-up. Left colonic diverticulosis. Musculoskeletal: Exaggerated thoracic kyphosis with degenerative change throughout the thoracic spine. Chronic mild L1 compression deformity. Slight increase in Schmorl's node inferior endplate of T11. Prior median sternotomy. There are no acute or suspicious osseous abnormalities. IMPRESSION: 1. Small bilateral pleural effusions, left slightly larger than right. Slight vascular congestion. 2. Mild patchy ground-glass opacity in the dependent left greater than right lower lobe may be atelectasis or pneumonia. 3. Moderate central bronchial thickening may be congestive or bronchitic. Aortic Atherosclerosis (ICD10-I70.0). Electronically Signed   By: Narda Rutherford M.D.   On: 10/23/2022 15:35   DG Chest Port 1 View  Result Date: 10/23/2022 CLINICAL DATA:  82 year old female with cough. Productive cough for 2 weeks. Some shortness of breath. EXAM: PORTABLE CHEST 1 VIEW COMPARISON:  Chest radiographs 10/02/2022 and earlier. FINDINGS: Portable AP upright view at 1107 hours. Prior CABG. Stable cardiomegaly and mediastinal contours. Calcified aortic atherosclerosis. Stable left chest dual lumen dialysis type catheter. Patchy, nonspecific increased left lung base opacity  partially obscuring the diaphragm now. No superimposed pneumothorax or consolidation. Stable pulmonary vascularity without overt edema. No confluent right lung opacity. Stable visualized osseous structures.  Negative visible bowel gas. IMPRESSION: 1. Patchy, nonspecific increased left lung base opacity new since last month. PA and lateral views may be helpful if feasible. 2. Otherwise stable cardiomegaly, pulmonary vascularity, prior CABG, chest dialysis catheter, Aortic Atherosclerosis. Electronically Signed   By: Odessa Fleming M.D.   On: 10/23/2022 11:16    Procedures .Marland KitchenIncision and Drainage  Date/Time: 10/23/2022 2:51 PM  Performed by: Jacalyn Lefevre, MD Authorized by: Jacalyn Lefevre, MD   Consent:    Consent obtained:  Verbal   Consent given by:  Patient Universal protocol:    Patient identity confirmed:  Verbally with patient Location:    Type:  Abscess Pre-procedure details:    Skin preparation:  Chlorhexidine Sedation:    Sedation type:  None Anesthesia:    Anesthesia method:  Local infiltration   Local anesthetic:  Lidocaine 2% WITH epi Procedure type:    Complexity:  Simple Procedure details:    Incision types:  Cruciate   Wound management:  Probed and deloculated   Drainage:  Purulent   Drainage amount:  Scant   Wound treatment:  Wound left open   Packing materials:  None Post-procedure details:    Procedure completion:  Tolerated well, no immediate complications     Medications Ordered in ED Medications  doxycycline (VIBRA-TABS) tablet 100 mg (has no administration in time range)  guaiFENesin-codeine 100-10 MG/5ML solution 5 mL (5 mLs Oral Given 10/23/22 1116)  lidocaine-EPINEPHrine (XYLOCAINE W/EPI) 2 %-1:200000 (PF) injection 10 mL (10 mLs Infiltration Given 10/23/22 1123)  cefTRIAXone (ROCEPHIN) 1 g in sodium chloride 0.9 % 100 mL IVPB (0 g Intravenous Stopped 10/23/22 1550)    ED Course/ Medical Decision Making/ A&P                             Medical Decision  Making Amount and/or Complexity of Data Reviewed Labs: ordered. Radiology: ordered.  Risk OTC drugs. Prescription drug management.   This patient presents to the ED for concern of sob, this involves an extensive number of treatment options, and is a complaint that carries with it a high risk of complications and morbidity.  The differential diagnosis includes pna, bronchitis, covid/flu/rsv   Co morbidities that complicate the patient evaluation  ESRD on HD (MWF), GERD, PUD, GIB, CAD, HLD, anemia, anxiety, HTN, AVM colon, ovarian and colon cancer, NSVT, p. Afib (not on thinners), and CHF   Additional history obtained:  Additional history obtained from epic chart review External records from outside source obtained and reviewed including husband   Lab Tests:  I Ordered, and personally interpreted labs.  The pertinent results include:  covid/flu/rsv neg, cbc with hgb 8.5 (chronic) and cr 6.19 (chronic)   Imaging Studies ordered:  I ordered imaging studies including cxr and ct chest I independently visualized and interpreted imaging which showed  CXR: . Patchy, nonspecific increased left lung base opacity new since  last month. PA and lateral views may be helpful if feasible.  2. Otherwise stable cardiomegaly, pulmonary vascularity, prior CABG,  chest dialysis catheter, Aortic Atherosclerosis.   I agree with the radiologist interpretation   Cardiac Monitoring:  The patient was maintained on a cardiac monitor.  I personally viewed and interpreted the cardiac monitored which showed an underlying rhythm of: nsr   Medicines ordered and prescription drug management:  I ordered medication including robitussin with codeine  for cough  Reevaluation of the patient after these medicines showed that the patient improved I have reviewed the patients home medicines and have made adjustments as needed   Test Considered:  ct   Critical Interventions:  abx   Problem List /  ED Course:  Pna:  pt given rocephin in ED and is d/c with doxy and robitussin + codeine Abscess:  I&D'd in ED.  Pt d/c with doxy Mild pulmonary edema:  dialysis tomorrow.  She  is not hypoxic, looks comfortable, and k is nl, so no need for dialysis now.   Reevaluation:  After the interventions noted above, I reevaluated the patient and found that they have :improved   Social Determinants of Health:  Lives at home   Dispostion:  After consideration of the diagnostic results and the patients response to treatment, I feel that the patent would benefit from discharge with outpatient f/u.          Final Clinical Impression(s) / ED Diagnoses Final diagnoses:  Abscess of forehead  Community acquired pneumonia, unspecified laterality    Rx / DC Orders ED Discharge Orders          Ordered    doxycycline (VIBRAMYCIN) 100 MG capsule  2 times daily        10/23/22 1549    guaiFENesin-codeine 100-10 MG/5ML syrup  Every 4 hours PRN        10/23/22 1607              Jacalyn Lefevre, MD 10/23/22 1609

## 2022-10-23 NOTE — ED Triage Notes (Signed)
Pt c/o of productive cough x2weeks. Pt becomes SOB from the coughing spells she states. Breathing tx given this am by husband.

## 2022-10-28 ENCOUNTER — Other Ambulatory Visit: Payer: Self-pay

## 2022-10-28 ENCOUNTER — Encounter (HOSPITAL_COMMUNITY): Payer: Self-pay

## 2022-10-28 ENCOUNTER — Emergency Department (HOSPITAL_COMMUNITY): Payer: Medicare Other

## 2022-10-28 ENCOUNTER — Inpatient Hospital Stay (HOSPITAL_COMMUNITY)
Admission: EM | Admit: 2022-10-28 | Discharge: 2022-11-15 | DRG: 377 | Disposition: A | Discharge status: Home Health | Payer: Medicare Other | Attending: Internal Medicine | Admitting: Internal Medicine

## 2022-10-28 DIAGNOSIS — Z7982 Long term (current) use of aspirin: Secondary | ICD-10-CM

## 2022-10-28 DIAGNOSIS — R1084 Generalized abdominal pain: Secondary | ICD-10-CM

## 2022-10-28 DIAGNOSIS — R001 Bradycardia, unspecified: Secondary | ICD-10-CM | POA: Diagnosis not present

## 2022-10-28 DIAGNOSIS — R1033 Periumbilical pain: Secondary | ICD-10-CM

## 2022-10-28 DIAGNOSIS — N2581 Secondary hyperparathyroidism of renal origin: Secondary | ICD-10-CM | POA: Diagnosis present

## 2022-10-28 DIAGNOSIS — Z8719 Personal history of other diseases of the digestive system: Secondary | ICD-10-CM

## 2022-10-28 DIAGNOSIS — K922 Gastrointestinal hemorrhage, unspecified: Secondary | ICD-10-CM | POA: Diagnosis present

## 2022-10-28 DIAGNOSIS — R109 Unspecified abdominal pain: Secondary | ICD-10-CM | POA: Diagnosis present

## 2022-10-28 DIAGNOSIS — Z951 Presence of aortocoronary bypass graft: Secondary | ICD-10-CM

## 2022-10-28 DIAGNOSIS — Z8249 Family history of ischemic heart disease and other diseases of the circulatory system: Secondary | ICD-10-CM

## 2022-10-28 DIAGNOSIS — I700 Atherosclerosis of aorta: Secondary | ICD-10-CM | POA: Diagnosis present

## 2022-10-28 DIAGNOSIS — G9341 Metabolic encephalopathy: Secondary | ICD-10-CM | POA: Diagnosis present

## 2022-10-28 DIAGNOSIS — Z8711 Personal history of peptic ulcer disease: Secondary | ICD-10-CM

## 2022-10-28 DIAGNOSIS — I480 Paroxysmal atrial fibrillation: Secondary | ICD-10-CM | POA: Diagnosis present

## 2022-10-28 DIAGNOSIS — I2510 Atherosclerotic heart disease of native coronary artery without angina pectoris: Secondary | ICD-10-CM | POA: Diagnosis present

## 2022-10-28 DIAGNOSIS — Z7901 Long term (current) use of anticoagulants: Secondary | ICD-10-CM

## 2022-10-28 DIAGNOSIS — Z8774 Personal history of (corrected) congenital malformations of heart and circulatory system: Secondary | ICD-10-CM

## 2022-10-28 DIAGNOSIS — I5022 Chronic systolic (congestive) heart failure: Secondary | ICD-10-CM | POA: Diagnosis present

## 2022-10-28 DIAGNOSIS — K559 Vascular disorder of intestine, unspecified: Secondary | ICD-10-CM | POA: Diagnosis present

## 2022-10-28 DIAGNOSIS — G253 Myoclonus: Secondary | ICD-10-CM | POA: Diagnosis not present

## 2022-10-28 DIAGNOSIS — K219 Gastro-esophageal reflux disease without esophagitis: Secondary | ICD-10-CM | POA: Diagnosis present

## 2022-10-28 DIAGNOSIS — D638 Anemia in other chronic diseases classified elsewhere: Secondary | ICD-10-CM | POA: Diagnosis present

## 2022-10-28 DIAGNOSIS — Z992 Dependence on renal dialysis: Secondary | ICD-10-CM

## 2022-10-28 DIAGNOSIS — E1122 Type 2 diabetes mellitus with diabetic chronic kidney disease: Secondary | ICD-10-CM | POA: Diagnosis present

## 2022-10-28 DIAGNOSIS — D62 Acute posthemorrhagic anemia: Secondary | ICD-10-CM | POA: Diagnosis present

## 2022-10-28 DIAGNOSIS — I10 Essential (primary) hypertension: Secondary | ICD-10-CM

## 2022-10-28 DIAGNOSIS — I471 Supraventricular tachycardia, unspecified: Secondary | ICD-10-CM | POA: Diagnosis not present

## 2022-10-28 DIAGNOSIS — K5900 Constipation, unspecified: Secondary | ICD-10-CM | POA: Diagnosis present

## 2022-10-28 DIAGNOSIS — N186 End stage renal disease: Secondary | ICD-10-CM | POA: Diagnosis present

## 2022-10-28 DIAGNOSIS — Z833 Family history of diabetes mellitus: Secondary | ICD-10-CM

## 2022-10-28 DIAGNOSIS — Z881 Allergy status to other antibiotic agents status: Secondary | ICD-10-CM

## 2022-10-28 DIAGNOSIS — K551 Chronic vascular disorders of intestine: Secondary | ICD-10-CM | POA: Diagnosis present

## 2022-10-28 DIAGNOSIS — M109 Gout, unspecified: Secondary | ICD-10-CM | POA: Diagnosis present

## 2022-10-28 DIAGNOSIS — K828 Other specified diseases of gallbladder: Secondary | ICD-10-CM | POA: Diagnosis present

## 2022-10-28 DIAGNOSIS — Z8673 Personal history of transient ischemic attack (TIA), and cerebral infarction without residual deficits: Secondary | ICD-10-CM

## 2022-10-28 DIAGNOSIS — E119 Type 2 diabetes mellitus without complications: Secondary | ICD-10-CM

## 2022-10-28 DIAGNOSIS — D539 Nutritional anemia, unspecified: Secondary | ICD-10-CM | POA: Diagnosis present

## 2022-10-28 DIAGNOSIS — F0390 Unspecified dementia without behavioral disturbance: Secondary | ICD-10-CM | POA: Diagnosis present

## 2022-10-28 DIAGNOSIS — E039 Hypothyroidism, unspecified: Secondary | ICD-10-CM | POA: Diagnosis present

## 2022-10-28 DIAGNOSIS — D649 Anemia, unspecified: Principal | ICD-10-CM

## 2022-10-28 DIAGNOSIS — Z955 Presence of coronary angioplasty implant and graft: Secondary | ICD-10-CM

## 2022-10-28 DIAGNOSIS — K449 Diaphragmatic hernia without obstruction or gangrene: Secondary | ICD-10-CM | POA: Diagnosis present

## 2022-10-28 DIAGNOSIS — Z9049 Acquired absence of other specified parts of digestive tract: Secondary | ICD-10-CM

## 2022-10-28 DIAGNOSIS — Z79899 Other long term (current) drug therapy: Secondary | ICD-10-CM

## 2022-10-28 DIAGNOSIS — Z8543 Personal history of malignant neoplasm of ovary: Secondary | ICD-10-CM

## 2022-10-28 DIAGNOSIS — D631 Anemia in chronic kidney disease: Secondary | ICD-10-CM | POA: Diagnosis present

## 2022-10-28 DIAGNOSIS — Z885 Allergy status to narcotic agent status: Secondary | ICD-10-CM

## 2022-10-28 DIAGNOSIS — R4189 Other symptoms and signs involving cognitive functions and awareness: Secondary | ICD-10-CM | POA: Diagnosis present

## 2022-10-28 DIAGNOSIS — Z83438 Family history of other disorder of lipoprotein metabolism and other lipidemia: Secondary | ICD-10-CM

## 2022-10-28 DIAGNOSIS — K254 Chronic or unspecified gastric ulcer with hemorrhage: Secondary | ICD-10-CM | POA: Diagnosis not present

## 2022-10-28 DIAGNOSIS — G9608 Other cranial cerebrospinal fluid leak: Secondary | ICD-10-CM | POA: Diagnosis present

## 2022-10-28 DIAGNOSIS — K5750 Diverticulosis of both small and large intestine without perforation or abscess without bleeding: Secondary | ICD-10-CM | POA: Diagnosis present

## 2022-10-28 DIAGNOSIS — I447 Left bundle-branch block, unspecified: Secondary | ICD-10-CM | POA: Diagnosis present

## 2022-10-28 DIAGNOSIS — K567 Ileus, unspecified: Secondary | ICD-10-CM

## 2022-10-28 DIAGNOSIS — Z79890 Hormone replacement therapy (postmenopausal): Secondary | ICD-10-CM

## 2022-10-28 DIAGNOSIS — Z886 Allergy status to analgesic agent status: Secondary | ICD-10-CM

## 2022-10-28 DIAGNOSIS — M898X9 Other specified disorders of bone, unspecified site: Secondary | ICD-10-CM | POA: Diagnosis present

## 2022-10-28 DIAGNOSIS — R1310 Dysphagia, unspecified: Secondary | ICD-10-CM | POA: Diagnosis not present

## 2022-10-28 DIAGNOSIS — D1339 Benign neoplasm of other parts of small intestine: Secondary | ICD-10-CM | POA: Diagnosis present

## 2022-10-28 DIAGNOSIS — Z85038 Personal history of other malignant neoplasm of large intestine: Secondary | ICD-10-CM

## 2022-10-28 DIAGNOSIS — T426X5A Adverse effect of other antiepileptic and sedative-hypnotic drugs, initial encounter: Secondary | ICD-10-CM | POA: Diagnosis not present

## 2022-10-28 DIAGNOSIS — E875 Hyperkalemia: Secondary | ICD-10-CM | POA: Diagnosis not present

## 2022-10-28 DIAGNOSIS — Z800 Family history of malignant neoplasm of digestive organs: Secondary | ICD-10-CM

## 2022-10-28 DIAGNOSIS — E7800 Pure hypercholesterolemia, unspecified: Secondary | ICD-10-CM | POA: Diagnosis present

## 2022-10-28 DIAGNOSIS — I132 Hypertensive heart and chronic kidney disease with heart failure and with stage 5 chronic kidney disease, or end stage renal disease: Secondary | ICD-10-CM | POA: Diagnosis present

## 2022-10-28 DIAGNOSIS — Z888 Allergy status to other drugs, medicaments and biological substances status: Secondary | ICD-10-CM

## 2022-10-28 DIAGNOSIS — Z91041 Radiographic dye allergy status: Secondary | ICD-10-CM

## 2022-10-28 DIAGNOSIS — I482 Chronic atrial fibrillation, unspecified: Secondary | ICD-10-CM | POA: Diagnosis present

## 2022-10-28 DIAGNOSIS — F05 Delirium due to known physiological condition: Secondary | ICD-10-CM | POA: Diagnosis not present

## 2022-10-28 DIAGNOSIS — I252 Old myocardial infarction: Secondary | ICD-10-CM

## 2022-10-28 LAB — CBC WITH DIFFERENTIAL/PLATELET
Abs Immature Granulocytes: 0.04 10*3/uL (ref 0.00–0.07)
Basophils Absolute: 0.1 10*3/uL (ref 0.0–0.1)
Basophils Relative: 2 %
Eosinophils Absolute: 0.2 10*3/uL (ref 0.0–0.5)
Eosinophils Relative: 3 %
HCT: 22.1 % — ABNORMAL LOW (ref 36.0–46.0)
Hemoglobin: 7.1 g/dL — ABNORMAL LOW (ref 12.0–15.0)
Immature Granulocytes: 1 %
Lymphocytes Relative: 10 %
Lymphs Abs: 0.6 10*3/uL — ABNORMAL LOW (ref 0.7–4.0)
MCH: 38.8 pg — ABNORMAL HIGH (ref 26.0–34.0)
MCHC: 32.1 g/dL (ref 30.0–36.0)
MCV: 120.8 fL — ABNORMAL HIGH (ref 80.0–100.0)
Monocytes Absolute: 0.6 10*3/uL (ref 0.1–1.0)
Monocytes Relative: 10 %
Neutro Abs: 4.6 10*3/uL (ref 1.7–7.7)
Neutrophils Relative %: 74 %
Platelets: 215 10*3/uL (ref 150–400)
RBC: 1.83 MIL/uL — ABNORMAL LOW (ref 3.87–5.11)
RDW: 22.2 % — ABNORMAL HIGH (ref 11.5–15.5)
WBC: 6.2 10*3/uL (ref 4.0–10.5)
nRBC: 0 % (ref 0.0–0.2)

## 2022-10-28 LAB — BASIC METABOLIC PANEL
Anion gap: 12 (ref 5–15)
BUN: 23 mg/dL (ref 8–23)
CO2: 25 mmol/L (ref 22–32)
Calcium: 8.1 mg/dL — ABNORMAL LOW (ref 8.9–10.3)
Chloride: 99 mmol/L (ref 98–111)
Creatinine, Ser: 3.63 mg/dL — ABNORMAL HIGH (ref 0.44–1.00)
GFR, Estimated: 12 mL/min — ABNORMAL LOW (ref >60)
Glucose, Bld: 136 mg/dL — ABNORMAL HIGH (ref 70–99)
Potassium: 3.5 mmol/L (ref 3.5–5.1)
Sodium: 136 mmol/L (ref 135–145)

## 2022-10-28 MED ORDER — ENALAPRILAT 1.25 MG/ML IV SOLN
0.6250 mg | Freq: Once | INTRAVENOUS | Status: AC
  Administered 2022-10-28: 0.625 mg via INTRAVENOUS
  Filled 2022-10-28: qty 2

## 2022-10-28 MED ORDER — CLONIDINE HCL 0.2 MG PO TABS
0.2000 mg | ORAL_TABLET | ORAL | Status: AC
  Administered 2022-10-28: 0.2 mg via ORAL
  Filled 2022-10-28: qty 1

## 2022-10-28 MED ORDER — SODIUM CHLORIDE 0.9% IV SOLUTION: Freq: Once | INTRAVENOUS | Status: AC

## 2022-10-28 NOTE — ED Provider Notes (Signed)
[place] EMERGENCY DEPARTMENT AT [facility] Provider Note   CSN: [id number] Arrival date & time: 10/28/22  2032     History  Chief Complaint  Patient presents with   Weakness    Shawna [name] is a 82 y.o. female.   Weakness  This patient is an 82 year old female with a known history of chronic anemia, end-stage renal disease on dialysis, hypertension, she also has known cardiac disease status post bypass grafting.  She dialyzes on Monday Wednesday and Friday and was able to complete dialysis today.  She reports that after dialysis she was feeling "terrible" but cannot tell me what that means.  She denies any chest pain or shortness of breath or coughing and has had no fevers chills nausea or vomiting, when I ask her what terrible means she cannot tell me.  When I give her description of different things such as nausea pain difficulty breathing or weakness she cannot tell me if it is any of those things, she states that she feels a little bit better at this time.  I have reviewed the medical record and the patient has been here frequently, including a variety of complaints such as an abscess of her forehead for which she was seen 5 days ago, 4 days before that for bronchitis, chronic diarrhea office visit 2 weeks before that, multiple visits to the ER in the office over the last several months.  She has chronic blood loss and had acute on chronic blood loss anemia with an admission for that in February approximately 2 months ago.    Home Medications Prior to Admission medications   Medication Sig Start Date End Date Taking? Authorizing Provider  ALPRAZolam Prudy Feeler) 0.5 MG tablet Take 1 tablet (0.5 mg total) by mouth daily as needed for anxiety. 08/17/22   Jerald Kief, MD  amiodarone (PACERONE) 100 MG tablet Take 1 tablet (100 mg total) by mouth daily. 04/27/22   Rhetta Mura, MD  Aspirin 81 MG CAPS Take 1 capsule by mouth daily at 12 noon.    [provider]   azithromycin (ZITHROMAX) 250 MG tablet Take 1 tablet (250 mg total) by mouth daily. Take first 2 tablets together, then 1 every day until finished. 10/02/22   Vanetta Mulders, MD  benzonatate (TESSALON) 100 MG capsule Take 100 mg by mouth 3 (three) times daily as needed for cough. 05/10/22   [provider]  doxycycline (VIBRAMYCIN) 100 MG capsule Take 1 capsule (100 mg total) by mouth 2 (two) times daily. 10/23/22   Jacalyn Lefevre, MD  folic acid (FOLVITE) 1 MG tablet Take 1 mg by mouth daily. 05/24/22   [provider]  guaiFENesin-codeine 100-10 MG/5ML syrup Take 5 mLs by mouth every 4 (four) hours as needed for cough. 10/23/22   Jacalyn Lefevre, MD  hydrOXYzine (ATARAX) 25 MG tablet Take 25 mg by mouth every 6 (six) hours as needed for itching. 06/06/22   [provider]  levothyroxine (SYNTHROID) 75 MCG tablet Take 1 tablet (75 mcg total) by mouth daily before breakfast. 08/14/22 10/06/22  Hughie Closs, MD  lisinopril (ZESTRIL) 20 MG tablet Take 1 tablet (20 mg total) by mouth daily. 08/18/22 10/06/22  Jerald Kief, MD  methylPREDNISolone (MEDROL DOSEPAK) 4 MG TBPK tablet Use as directed 10/19/22   Arthor Captain, PA-C  metoprolol tartrate (LOPRESSOR) 25 MG tablet Take 0.5 tablets (12.5 mg total) by mouth 2 (two) times daily. 03/04/22   Antoine Poche, MD  multivitamin (RENA-VIT) TABS tablet  Take 1 tablet by mouth daily.    [provider]  nitroGLYCERIN (NITROSTAT) 0.4 MG SL tablet Place 0.4 mg under the tongue every 5 (five) minutes as needed for chest pain.    [provider]  omeprazole (PRILOSEC) 40 MG capsule Take 1 capsule (40 mg total) by mouth daily. 10/06/22   Aida Raider, NP  ondansetron (ZOFRAN-ODT) 4 MG disintegrating tablet Take 4 mg by mouth every 8 (eight) hours as needed for nausea or vomiting. 05/05/22   [provider]  rosuvastatin (CRESTOR) 10 MG tablet Take 1 tablet by mouth once daily 06/06/22   [place] Ouch, [place]  M, PA-C  sevelamer carbonate (RENVELA) 800 MG tablet Take 3,200 mg by mouth in the morning and at bedtime. 12/27/21   [provider]      Allergies    Amlodipine, Aspirin, Nitrofurantoin, Bactrim [sulfamethoxazole-trimethoprim], Contrast media [iodinated contrast media], Iron, Tylenol [acetaminophen], Gabapentin, Iron sucrose, Ranexa [ranolazine], Sucroferric oxyhydroxide, Dexilant [dexlansoprazole], Hydralazine, Levaquin [levofloxacin in d5w], Morphine and related, Pantoprazole, Plavix [clopidogrel bisulfate], Protonix [pantoprazole sodium], and Venofer [ferric oxide]    Review of Systems   Review of Systems  Neurological:  Positive for weakness.  All other systems reviewed and are negative.   Physical Exam Updated Vital Signs BP (!) 190/62   Pulse 60   Temp 98.3 F (36.8 C) (Oral)   Resp 18   Ht 1.651 m ( )   Wt 58 kg   SpO2 99%   BMI 21.28 kg/m  Physical Exam Vitals and nursing note reviewed.  Constitutional:      General: She is not in acute distress.    Appearance: She is well-developed.  HENT:     Head: Normocephalic and atraumatic.     Mouth/Throat:     Pharynx: No oropharyngeal exudate.  Eyes:     General: No scleral icterus.       Right eye: No discharge.        Left eye: No discharge.     Conjunctiva/sclera: Conjunctivae normal.     Pupils: Pupils are equal, round, and reactive to light.  Neck:     Thyroid: No thyromegaly.     Vascular: No JVD.  Cardiovascular:     Rate and Rhythm: Normal rate and regular rhythm.     Heart sounds: Normal heart sounds. No murmur heard.    No friction rub. No gallop.  Pulmonary:     Effort: Pulmonary effort is normal. No respiratory distress.     Breath sounds: Normal breath sounds. No wheezing or rales.  Abdominal:     General: Bowel sounds are normal. There is no distension.     Palpations: Abdomen is soft. There is no mass.     Tenderness: There is no abdominal tenderness.  Musculoskeletal:         General: No tenderness. Normal range of motion.     Cervical back: Normal range of motion and neck supple.     Right lower leg: Edema present.     Left lower leg: Edema present.  Lymphadenopathy:     Cervical: No cervical adenopathy.  Skin:    General: Skin is warm and dry.     Findings: No erythema or rash.  Neurological:     General: No focal deficit present.     Mental Status: She is alert.     Coordination: Coordination normal.     Comments: No deficits beyond the patient's baseline.  She states that she has a weak leg on  the left, she has some knee pain chronically and has difficulty moving the leg.  All other extremities are normal, all of her extremities are at their baseline according to the patient  Psychiatric:        Behavior: Behavior normal.     ED Results / Procedures / Treatments   Labs (all labs ordered are listed, but only abnormal results are displayed) Labs Reviewed  BASIC METABOLIC PANEL - Abnormal; Notable for the following components:      Result Value   Glucose, Bld 136 (*)    Creatinine, Ser 3.63 (*)    Calcium 8.1 (*)    GFR, Estimated 12 (*)    All other components within normal limits  CBC WITH DIFFERENTIAL/PLATELET - Abnormal; Notable for the following components:   RBC 1.83 (*)    Hemoglobin 7.1 (*)    HCT 22.1 (*)    MCV 120.8 (*)    MCH 38.8 (*)    RDW 22.2 (*)    Lymphs Abs 0.6 (*)    All other components within normal limits  OCCULT BLOOD X 1 CARD TO LAB, STOOL  TYPE AND SCREEN  PREPARE RBC (CROSSMATCH)    EKG EKG Interpretation  Date/Time:  Friday October 28 2022 20:59:42 EDT Ventricular Rate:  60 PR Interval:    QRS Duration: 130 QT Interval:  514 QTC Calculation: 514 R Axis:   18 Text Interpretation: Wide QRS rhythm Left bundle branch block Abnormal ECG When compared with ECG of 23-Oct-2022 12:48, PREVIOUS ECG IS PRESENT since last tracing no significant change Confirmed by Eber Hong ([id number]) on 10/28/2022 9:51:41  PM  Radiology DG Chest Port 1 View  Result Date: 10/28/2022 CLINICAL DATA:  Cough EXAM: PORTABLE CHEST 1 VIEW COMPARISON:  10/23/2022 FINDINGS: Prior CABG. Left dialysis catheter remains in place with the tip in the right atrium. Mild cardiomegaly. No overt edema. Bibasilar atelectasis. No effusions or acute bony abnormality. IMPRESSION: Cardiomegaly, bibasilar atelectasis. Electronically Signed   By: Charlett Nose M.D.   On: 10/28/2022 22:15    Procedures Procedures    Medications Ordered in ED Medications  0.9 %  sodium chloride infusion (Manually program via Guardrails IV Fluids) (has no administration in time range)  enalaprilat (VASOTEC) injection 0.625 mg (has no administration in time range)  cloNIDine (CATAPRES) tablet 0.2 mg (has no administration in time range)    ED Course/ Medical Decision Making/ A&P                             Medical Decision Making Amount and/or Complexity of Data Reviewed Labs: ordered. Radiology: ordered.  Risk Prescription drug management. Decision regarding hospitalization.   EKG is unchanged, chest x-ray unremarkable, labs show that the patient has some recurrent significant anemia.  In fact it shows a 1.4 g drop in hemoglobin over the last 5 days down to 7.1.  I would consider this consistent with symptomatic anemia  Hemoglobin  Date Value Ref Range Status  10/28/2022 7.1 (L) 12.0 - 15.0 g/dL Final  60/45/4098 8.5 (L) 12.0 - 15.0 g/dL Final  11/91/4782 7.9 (L) 12.0 - 15.0 g/dL Final  95/62/1308 9.0 (L) 12.0 - 15.0 g/dL Final   Will order type and screen and give a unit of blood, she will be admitted to the hospital.  Dr. Marletta Lor performed an upper endoscopy February 18 approximately 2 months ago, during that time she had a normal esophagus, multiple nonbleeding angiodysplastic lesions in the  stomach, they were treated with argon plasma at that time.  Vital signs reflect the patient is hypertensive, she will need her nighttime  medications.  Will discuss with hospitalist for admission  Imaging: No signs of acute infiltrate or abnormal findings on x-ray.  Cardiac monitoring: Normal sinus rhythm, rate controlled  I discussed the case with Dr. Thomes Dinning of the hospitalist service who will admit.        Final Clinical Impression(s) / ED Diagnoses Final diagnoses:  Symptomatic anemia  Severe hypertension    Rx / DC Orders ED Discharge Orders     None         Eber Hong, MD 10/28/22 2239

## 2022-10-28 NOTE — ED Triage Notes (Signed)
Pt arrived vi POV c/o weakness and not feeling well. Pt report being at dialysis earlier today and felt fine. Pt reports being seen here recently and is getting over pneumonia.

## 2022-10-29 DIAGNOSIS — R531 Weakness: Secondary | ICD-10-CM | POA: Diagnosis not present

## 2022-10-29 DIAGNOSIS — E039 Hypothyroidism, unspecified: Secondary | ICD-10-CM | POA: Diagnosis present

## 2022-10-29 DIAGNOSIS — D1339 Benign neoplasm of other parts of small intestine: Secondary | ICD-10-CM | POA: Diagnosis not present

## 2022-10-29 DIAGNOSIS — N186 End stage renal disease: Secondary | ICD-10-CM | POA: Diagnosis present

## 2022-10-29 DIAGNOSIS — D539 Nutritional anemia, unspecified: Secondary | ICD-10-CM | POA: Diagnosis not present

## 2022-10-29 DIAGNOSIS — D631 Anemia in chronic kidney disease: Secondary | ICD-10-CM | POA: Diagnosis present

## 2022-10-29 DIAGNOSIS — I471 Supraventricular tachycardia, unspecified: Secondary | ICD-10-CM | POA: Diagnosis not present

## 2022-10-29 DIAGNOSIS — I480 Paroxysmal atrial fibrillation: Secondary | ICD-10-CM | POA: Diagnosis present

## 2022-10-29 DIAGNOSIS — K559 Vascular disorder of intestine, unspecified: Secondary | ICD-10-CM | POA: Diagnosis not present

## 2022-10-29 DIAGNOSIS — K551 Chronic vascular disorders of intestine: Secondary | ICD-10-CM | POA: Diagnosis present

## 2022-10-29 DIAGNOSIS — K5900 Constipation, unspecified: Secondary | ICD-10-CM | POA: Diagnosis not present

## 2022-10-29 DIAGNOSIS — I2510 Atherosclerotic heart disease of native coronary artery without angina pectoris: Secondary | ICD-10-CM

## 2022-10-29 DIAGNOSIS — D649 Anemia, unspecified: Principal | ICD-10-CM | POA: Diagnosis present

## 2022-10-29 DIAGNOSIS — K5710 Diverticulosis of small intestine without perforation or abscess without bleeding: Secondary | ICD-10-CM | POA: Diagnosis not present

## 2022-10-29 DIAGNOSIS — R5383 Other fatigue: Secondary | ICD-10-CM | POA: Diagnosis not present

## 2022-10-29 DIAGNOSIS — D638 Anemia in other chronic diseases classified elsewhere: Secondary | ICD-10-CM | POA: Diagnosis present

## 2022-10-29 DIAGNOSIS — E1149 Type 2 diabetes mellitus with other diabetic neurological complication: Secondary | ICD-10-CM | POA: Diagnosis not present

## 2022-10-29 DIAGNOSIS — I10 Essential (primary) hypertension: Secondary | ICD-10-CM | POA: Diagnosis not present

## 2022-10-29 DIAGNOSIS — R1033 Periumbilical pain: Secondary | ICD-10-CM | POA: Diagnosis not present

## 2022-10-29 DIAGNOSIS — F05 Delirium due to known physiological condition: Secondary | ICD-10-CM | POA: Diagnosis not present

## 2022-10-29 DIAGNOSIS — K567 Ileus, unspecified: Secondary | ICD-10-CM | POA: Diagnosis not present

## 2022-10-29 DIAGNOSIS — Z992 Dependence on renal dialysis: Secondary | ICD-10-CM | POA: Diagnosis not present

## 2022-10-29 DIAGNOSIS — I25118 Atherosclerotic heart disease of native coronary artery with other forms of angina pectoris: Secondary | ICD-10-CM | POA: Diagnosis not present

## 2022-10-29 DIAGNOSIS — E7800 Pure hypercholesterolemia, unspecified: Secondary | ICD-10-CM | POA: Diagnosis present

## 2022-10-29 DIAGNOSIS — E1122 Type 2 diabetes mellitus with diabetic chronic kidney disease: Secondary | ICD-10-CM | POA: Diagnosis present

## 2022-10-29 DIAGNOSIS — D62 Acute posthemorrhagic anemia: Secondary | ICD-10-CM | POA: Diagnosis present

## 2022-10-29 DIAGNOSIS — F0390 Unspecified dementia without behavioral disturbance: Secondary | ICD-10-CM | POA: Diagnosis present

## 2022-10-29 DIAGNOSIS — K922 Gastrointestinal hemorrhage, unspecified: Secondary | ICD-10-CM | POA: Diagnosis present

## 2022-10-29 DIAGNOSIS — I5022 Chronic systolic (congestive) heart failure: Secondary | ICD-10-CM | POA: Diagnosis present

## 2022-10-29 DIAGNOSIS — Z7984 Long term (current) use of oral hypoglycemic drugs: Secondary | ICD-10-CM | POA: Diagnosis not present

## 2022-10-29 DIAGNOSIS — K219 Gastro-esophageal reflux disease without esophagitis: Secondary | ICD-10-CM | POA: Diagnosis present

## 2022-10-29 DIAGNOSIS — E119 Type 2 diabetes mellitus without complications: Secondary | ICD-10-CM | POA: Diagnosis not present

## 2022-10-29 DIAGNOSIS — R569 Unspecified convulsions: Secondary | ICD-10-CM | POA: Diagnosis not present

## 2022-10-29 DIAGNOSIS — Z8774 Personal history of (corrected) congenital malformations of heart and circulatory system: Secondary | ICD-10-CM | POA: Diagnosis not present

## 2022-10-29 DIAGNOSIS — K254 Chronic or unspecified gastric ulcer with hemorrhage: Secondary | ICD-10-CM | POA: Diagnosis present

## 2022-10-29 DIAGNOSIS — I482 Chronic atrial fibrillation, unspecified: Secondary | ICD-10-CM

## 2022-10-29 DIAGNOSIS — D509 Iron deficiency anemia, unspecified: Secondary | ICD-10-CM | POA: Diagnosis not present

## 2022-10-29 DIAGNOSIS — K449 Diaphragmatic hernia without obstruction or gangrene: Secondary | ICD-10-CM | POA: Diagnosis not present

## 2022-10-29 DIAGNOSIS — Z9189 Other specified personal risk factors, not elsewhere classified: Secondary | ICD-10-CM | POA: Diagnosis not present

## 2022-10-29 DIAGNOSIS — I700 Atherosclerosis of aorta: Secondary | ICD-10-CM | POA: Diagnosis present

## 2022-10-29 DIAGNOSIS — G9608 Other cranial cerebrospinal fluid leak: Secondary | ICD-10-CM | POA: Diagnosis present

## 2022-10-29 DIAGNOSIS — R109 Unspecified abdominal pain: Secondary | ICD-10-CM | POA: Diagnosis not present

## 2022-10-29 DIAGNOSIS — R1084 Generalized abdominal pain: Secondary | ICD-10-CM | POA: Diagnosis not present

## 2022-10-29 DIAGNOSIS — M109 Gout, unspecified: Secondary | ICD-10-CM | POA: Diagnosis present

## 2022-10-29 DIAGNOSIS — K259 Gastric ulcer, unspecified as acute or chronic, without hemorrhage or perforation: Secondary | ICD-10-CM | POA: Diagnosis not present

## 2022-10-29 DIAGNOSIS — R1013 Epigastric pain: Secondary | ICD-10-CM | POA: Diagnosis not present

## 2022-10-29 DIAGNOSIS — K5520 Angiodysplasia of colon without hemorrhage: Secondary | ICD-10-CM | POA: Diagnosis not present

## 2022-10-29 DIAGNOSIS — R1319 Other dysphagia: Secondary | ICD-10-CM | POA: Diagnosis not present

## 2022-10-29 DIAGNOSIS — N2581 Secondary hyperparathyroidism of renal origin: Secondary | ICD-10-CM | POA: Diagnosis present

## 2022-10-29 DIAGNOSIS — G253 Myoclonus: Secondary | ICD-10-CM | POA: Diagnosis not present

## 2022-10-29 DIAGNOSIS — G9341 Metabolic encephalopathy: Secondary | ICD-10-CM | POA: Diagnosis not present

## 2022-10-29 DIAGNOSIS — I132 Hypertensive heart and chronic kidney disease with heart failure and with stage 5 chronic kidney disease, or end stage renal disease: Secondary | ICD-10-CM | POA: Diagnosis present

## 2022-10-29 DIAGNOSIS — K317 Polyp of stomach and duodenum: Secondary | ICD-10-CM | POA: Diagnosis not present

## 2022-10-29 DIAGNOSIS — K31811 Angiodysplasia of stomach and duodenum with bleeding: Secondary | ICD-10-CM | POA: Diagnosis not present

## 2022-10-29 LAB — CBC
HCT: 28.9 % — ABNORMAL LOW (ref 36.0–46.0)
Hemoglobin: 9.9 g/dL — ABNORMAL LOW (ref 12.0–15.0)
MCH: 35.1 pg — ABNORMAL HIGH (ref 26.0–34.0)
MCHC: 34.3 g/dL (ref 30.0–36.0)
MCV: 102.5 fL — ABNORMAL HIGH (ref 80.0–100.0)
Platelets: 188 10*3/uL (ref 150–400)
RBC: 2.82 MIL/uL — ABNORMAL LOW (ref 3.87–5.11)
RDW: 28 % — ABNORMAL HIGH (ref 11.5–15.5)
WBC: 5.6 10*3/uL (ref 4.0–10.5)
nRBC: 0 % (ref 0.0–0.2)

## 2022-10-29 LAB — BPAM RBC: Blood Product Expiration Date: 202405272359

## 2022-10-29 LAB — TYPE AND SCREEN: Unit division: 0

## 2022-10-29 LAB — PREPARE RBC (CROSSMATCH)

## 2022-10-29 MED ORDER — CALCITRIOL 0.25 MCG PO CAPS
1.7500 ug | ORAL_CAPSULE | ORAL | Status: DC
  Administered 2022-10-31 – 2022-11-14 (×7): 1.75 ug via ORAL
  Filled 2022-10-29 (×7): qty 7

## 2022-10-29 MED ORDER — CHLORHEXIDINE GLUCONATE CLOTH 2 % EX PADS
6.0000 | MEDICATED_PAD | Freq: Every day | CUTANEOUS | Status: DC
  Administered 2022-10-29 – 2022-11-11 (×14): 6 via TOPICAL

## 2022-10-29 MED ORDER — OCTREOTIDE ACETATE 100 MCG/ML IJ SOLN
100.0000 ug | Freq: Two times a day (BID) | INTRAMUSCULAR | Status: DC
  Administered 2022-10-29 – 2022-11-14 (×31): 100 ug via SUBCUTANEOUS
  Filled 2022-10-29 (×42): qty 1

## 2022-10-29 MED ORDER — CINACALCET HCL 30 MG PO TABS
30.0000 mg | ORAL_TABLET | ORAL | Status: DC
  Administered 2022-10-31 – 2022-11-14 (×7): 30 mg via ORAL
  Filled 2022-10-29 (×7): qty 1

## 2022-10-29 MED ORDER — AMIODARONE HCL 200 MG PO TABS
100.0000 mg | ORAL_TABLET | Freq: Every day | ORAL | Status: DC
  Administered 2022-10-30 – 2022-11-15 (×16): 100 mg via ORAL
  Filled 2022-10-29 (×16): qty 1

## 2022-10-29 MED ORDER — LEVOTHYROXINE SODIUM 75 MCG PO TABS
75.0000 ug | ORAL_TABLET | Freq: Every day | ORAL | Status: DC
  Administered 2022-10-30 – 2022-11-15 (×16): 75 ug via ORAL
  Filled 2022-10-29 (×8): qty 1
  Filled 2022-10-29: qty 3
  Filled 2022-10-29 (×7): qty 1

## 2022-10-29 MED ORDER — ONDANSETRON HCL 4 MG/2ML IJ SOLN
4.0000 mg | Freq: Four times a day (QID) | INTRAMUSCULAR | Status: DC | PRN
  Administered 2022-10-29 – 2022-11-01 (×3): 4 mg via INTRAVENOUS
  Filled 2022-10-29 (×3): qty 2

## 2022-10-29 MED ORDER — METOPROLOL TARTRATE 12.5 MG HALF TABLET
12.5000 mg | ORAL_TABLET | Freq: Two times a day (BID) | ORAL | Status: DC
  Administered 2022-10-30 – 2022-11-14 (×25): 12.5 mg via ORAL
  Filled 2022-10-29 (×32): qty 1

## 2022-10-29 MED ORDER — ALPRAZOLAM 0.5 MG PO TABS
0.5000 mg | ORAL_TABLET | Freq: Every day | ORAL | Status: DC | PRN
  Administered 2022-10-31 – 2022-11-13 (×9): 0.5 mg via ORAL
  Filled 2022-10-29 (×9): qty 1

## 2022-10-29 MED ORDER — ONDANSETRON HCL 4 MG PO TABS
4.0000 mg | ORAL_TABLET | Freq: Four times a day (QID) | ORAL | Status: DC | PRN
  Administered 2022-10-30: 4 mg via ORAL
  Filled 2022-10-29: qty 1

## 2022-10-29 MED ORDER — ROSUVASTATIN CALCIUM 5 MG PO TABS
10.0000 mg | ORAL_TABLET | Freq: Every day | ORAL | Status: DC
  Administered 2022-10-29 – 2022-11-15 (×18): 10 mg via ORAL
  Filled 2022-10-29 (×2): qty 1
  Filled 2022-10-29: qty 2
  Filled 2022-10-29 (×3): qty 1
  Filled 2022-10-29 (×2): qty 2
  Filled 2022-10-29 (×5): qty 1
  Filled 2022-10-29: qty 2
  Filled 2022-10-29 (×4): qty 1

## 2022-10-29 MED ORDER — CHLORHEXIDINE GLUCONATE CLOTH 2 % EX PADS
6.0000 | MEDICATED_PAD | Freq: Every day | CUTANEOUS | Status: DC
  Administered 2022-10-30 – 2022-11-11 (×11): 6 via TOPICAL

## 2022-10-29 NOTE — H&P (Signed)
History and Physical    Patient: Shawna [name]:[id number] DOB: 1940-08-16 DOA: 10/28/2022 DOS: the patient was seen and examined on 10/29/2022 PCP: [facility]  Patient coming from: Home  Chief Complaint:  Chief Complaint  Patient presents with   Weakness   HPI: Shawna [name] is a 82 y.o. female with medical history significant of CAD s/p CABG, chronic systolic CHF, hypertension, hyperlipidemia, ESRD on HD (MWF) and afib on anticoagulation who presents to the emergency department due to weakness and not feeling well after dialysis.  She was recently seen in the ED due to dialysis and patient has had several visits to the ED within the last few months for various complaints. Patient was admitted from 2/16 to 2/19 due to acute on chronic blood loss anemia, symptomatic anemia and anemia of CKD.  Patient had EGD and colonoscopy-(08/10/2022), duodenal and colonic polyps were removed, duodenal AVM was ablated and 2 units of PRBC were given.  IV iron and Procrit also given at that time. He had an EGD (08/27/2022) during which  multiple gastric AVMs treated with APC, small hiatal hernia, no active or stigmata of bleeding. VCE placed during EGD and showed few small AVMs in the small bowel, likely jejunum.  2 small polyps in the distal small bowel previously noted.  No active or stigmata of bleeding.   ED Course:  In the emergency department, BP was elevated at 190/62, but other vital signs are within normal range.  Workup in the ED showed macrocytic anemia with H/H at 7.1/22.1 (this was 11.3 on 08/28/2022 and 8.5/26.8 on 10/23/2022). BMP was normal except for blood glucose of 136 and creatinine of 3.63.  eGFR was 12. Chest x-ray showed cardiomegaly, bibasilar atelectasis Clonidine and Vasotec were given due to elevated BP.  Hospitalist was asked to admit patient for further evaluation and management.  Review of Systems: Review of systems as noted in the HPI. All other systems reviewed and  are negative.   Past Medical History:  Diagnosis Date   Acute on chronic respiratory failure with hypoxia 10/10/2016   Anxiety    Arthritis    AVM (arteriovenous malformation) of colon    CAD (coronary artery disease)    a. s/p CABG in 2013 b. DES to D1 in 10/2016. c. cath in 07/2018 showing patent grafts with occlusion of D1 at prior stent site and progression of PDA disease --> medical management recommended   Carotid artery disease    a. 60-79% LICA, 03/2012    Chronic bronchitis    Chronic HFrEF (heart failure with reduced ejection fraction)    Colon cancer 1992   Esophageal stricture    ESRD on hemodialysis    ESRD due to HTN, started dialysis 2011 and gets HD at [facility] with Dr Fausto Skillern on MWF schedule.  Access is LUA AVF as of Sept 2014.    GERD (gastroesophageal reflux disease)    High cholesterol 12/2011   History of blood transfusion 07/2011; 12/2011; 01/2012 X 2; 04/2012   History of gout    History of lower GI bleeding    Hypertension    Iron deficiency anemia    Jugular vein occlusion, right    Mitral regurgitation    a. Moderate by echo, 02/2012   Mitral valve disease    NSVT (nonsustained ventricular tachycardia)    Ovarian cancer 1992   PAF (paroxysmal atrial fibrillation)    Pneumonia ~ 2009   PUD (peptic ulcer disease)    TIA (transient  ischemic attack)    Tricuspid valve disease    Past Surgical History:  Procedure Laterality Date   A/V FISTULAGRAM Left 10/04/2022   Procedure: A/V Fistulagram;  Surgeon: Renford Dills, MD;  Location: [facility] INVASIVE CV LAB;  Service: Cardiovascular;  Laterality: Left;   A/V SHUNTOGRAM Left 03/19/2019   Procedure: A/V SHUNTOGRAM;  Surgeon: Renford Dills, MD;  Location: [facility] INVASIVE CV LAB;  Service: Cardiovascular;  Laterality: Left;   ABDOMINAL HYSTERECTOMY  1992   APPENDECTOMY  06/1990   AV FISTULA PLACEMENT  07/2009   left upper arm   AV FISTULA PLACEMENT Right 09/06/2016   Procedure: RIGHT FOREARM ARTERIOVENOUS  (AV) GRAFT;  Surgeon: Sherren Kerns, MD;  [facility]: [facility] OR;  Service: Vascular;  Laterality: Right;   AV FISTULA PLACEMENT N/A 02/24/2017   Procedure: INSERTION OF ARTERIOVENOUS (AV) GORE-TEX GRAFT ARM (BRACHIAL AXILLARY);  Surgeon: Renford Dills, MD;  Location: [facility] ORS;  Service: Vascular;  Laterality: N/A;   AVGG REMOVAL Right 09/06/2016   Procedure: REMOVAL OF Right Arm ARTERIOVENOUS GORETEX GRAFT and Vein Patch angioplasty of brachial artery;  Surgeon: Chuck Hint, MD;  Location: [facility] OR;  Service: Vascular;  Laterality: Right;   BIOPSY  09/26/2019   Procedure: BIOPSY;  Surgeon: Malissa Hippo, MD;  [facility];  Service: Endoscopy;;   COLON RESECTION  1992   COLON SURGERY     COLONOSCOPY N/A 03/09/2019   Procedure: COLONOSCOPY;  Surgeon: Malissa Hippo, MD;  [facility];  Service: Endoscopy;  Laterality: N/A;   COLONOSCOPY N/A 08/11/2022   Procedure: COLONOSCOPY;  Surgeon: Beverley Fiedler, MD;  Location: [facility] ENDOSCOPY;  Service: Gastroenterology;  Laterality: N/A;   COLONOSCOPY WITH PROPOFOL N/A 06/08/2021   Procedure: COLONOSCOPY WITH PROPOFOL;  Surgeon: Dolores Frame, MD;  [facility];  Service: Gastroenterology;  Laterality: N/A;  9:05 /Patient is on dialysis Mon Wed Fri   CORONARY ANGIOPLASTY WITH STENT PLACEMENT  12/15/11   "2"   CORONARY ANGIOPLASTY WITH STENT PLACEMENT  y/2013   "1; makes total of 3" (05/02/2012)   CORONARY ARTERY BYPASS GRAFT  06/13/2012   Procedure: CORONARY ARTERY BYPASS GRAFTING (CABG);  Surgeon: Delight Ovens, MD;  Location: [facility] OR;  Service: Open Heart Surgery;  Laterality: N/A;  cabg x four;  using left internal mammary artery, and left leg greater saphenous vein harvested endoscopically   CORONARY STENT INTERVENTION N/A 10/13/2016   Procedure: Coronary Stent Intervention;  Surgeon: Lennette Bihari, MD;  [facility];  Service: Cardiovascular;  Laterality: N/A;   DIALYSIS/PERMA CATHETER  REMOVAL N/A 04/18/2017   Procedure: DIALYSIS/PERMA CATHETER REMOVAL;  Surgeon: Renford Dills, MD;  Location: [facility] INVAS[facility] CV LAB;  Service: Cardiovascular;  Laterality: N/A;   DILATION AND CURETTAGE OF UTERUS     ENTEROSCOPY N/A 06/08/2021   Procedure: PUSH ENTEROSCOPY;  Surgeon: Dolores Frame, MD;  [facility];  Service: Gastroenterology;  Laterality: N/A;   ESOPHAGOGASTRODUODENOSCOPY  01/20/2012   Procedure: ESOPHAGOGASTRODUODENOSCOPY (EGD);  Surgeon: Meryl Dare, MD,FACG;  Location: [facility] ENDOSCOPY;  Service: Endoscopy;  Laterality: N/A;   ESOPHAGOGASTRODUODENOSCOPY N/A 03/26/2013   Procedure: ESOPHAGOGASTRODUODENOSCOPY (EGD);  Surgeon: Hilarie Fredrickson, MD;  Location: [facility] ENDOSCOPY;  Service: Endoscopy;  Laterality: N/A;   ESOPHAGOGASTRODUODENOSCOPY N/A 04/30/2015   Procedure: ESOPHAGOGASTRODUODENOSCOPY (EGD);  Surgeon: Malissa Hippo, MD;  [facility];  Service: Endoscopy;  Laterality: N/A;  1pm - moved to 10/20 @ 1:10   ESOPHAGOGASTRODUODENOSCOPY  N/A 07/29/2016   Procedure: ESOPHAGOGASTRODUODENOSCOPY (EGD);  Surgeon: Ruffin Frederick, MD;  Location: [facility] ENDOSCOPY;  Service: Gastroenterology;  Laterality: N/A;  enteroscopy   ESOPHAGOGASTRODUODENOSCOPY N/A 09/26/2019   Procedure: ESOPHAGOGASTRODUODENOSCOPY (EGD);  Surgeon: Malissa Hippo, MD;  [facility];  Service: Endoscopy;  Laterality: N/A;  1250   ESOPHAGOGASTRODUODENOSCOPY N/A 08/11/2022   Procedure: ESOPHAGOGASTRODUODENOSCOPY (EGD);  Surgeon: Beverley Fiedler, MD;  Location: [facility] ENDOSCOPY;  Service: Gastroenterology;  Laterality: N/A;   ESOPHAGOGASTRODUODENOSCOPY (EGD) WITH PROPOFOL N/A 02/05/2021   Procedure: ESOPHAGOGASTRODUODENOSCOPY (EGD) WITH PROPOFOL;  Surgeon: Lanelle Bal, DO;  [facility];  Service: Endoscopy;  Laterality: N/A;   ESOPHAGOGASTRODUODENOSCOPY (EGD) WITH PROPOFOL N/A 08/27/2022   Procedure: ESOPHAGOGASTRODUODENOSCOPY (EGD) WITH PROPOFOL;  Surgeon:  Lanelle Bal, DO;  [facility];  Service: Endoscopy;  Laterality: N/A;   GIVENS CAPSULE STUDY N/A 03/07/2019   Procedure: GIVENS CAPSULE STUDY;  Surgeon: Malissa Hippo, MD;  [facility];  Service: Endoscopy;  Laterality: N/A;  7:30   GIVENS CAPSULE STUDY N/A 04/22/2021   Procedure: GIVENS CAPSULE STUDY;  Surgeon: Malissa Hippo, MD;  [facility];  Service: Endoscopy;  Laterality: N/A;  7:30   GIVENS CAPSULE STUDY N/A 08/27/2022   Procedure: GIVENS CAPSULE STUDY;  Surgeon: Lanelle Bal, DO;  [facility];  Service: Endoscopy;  Laterality: N/A;   HOT HEMOSTASIS N/A 08/11/2022   Procedure: HOT HEMOSTASIS (ARGON PLASMA COAGULATION/BICAP);  Surgeon: Beverley Fiedler, MD;  Location: [facility] ENDOSCOPY;  Service: Gastroenterology;  Laterality: N/A;   INSERTION OF DIALYSIS CATHETER N/A 10/05/2020   Procedure: ABORTED TUNNELED DIALYSIS CATHETER PLACEMENT RIGHT INTERNAL JUGULAR VEIN ;  Surgeon: Lucretia Roers, MD;  Location: [facility];  Service: General;  Laterality: N/A;   INTRAOPERATIVE TRANSESOPHAGEAL ECHOCARDIOGRAM  06/13/2012   Procedure: INTRAOPERATIVE TRANSESOPHAGEAL ECHOCARDIOGRAM;  Surgeon: Delight Ovens, MD;  Location: [facility] OR;  Service: Open Heart Surgery;  Laterality: N/A;   IR DIALY SHUNT INTRO NEEDLE/INTRACATH INITIAL W/IMG LEFT Left 10/06/2020   IR FLUORO GUIDE CV LINE RIGHT  06/17/2020   IR GENERIC HISTORICAL  07/26/2016   IR FLUORO GUIDE CV LINE RIGHT 07/26/2016 Berdine Dance, MD [facility]   IR GENERIC HISTORICAL  07/26/2016   IR US GUIDE VASC ACCESS RIGHT 07/26/2016 Berdine Dance, MD [facility]   IR GENERIC HISTORICAL  08/02/2016   IR US GUIDE VASC ACCESS RIGHT 08/02/2016 Berdine Dance, MD [facility]   IR GENERIC HISTORICAL  08/02/2016   IR FLUORO GUIDE CV LINE RIGHT 08/02/2016 Berdine Dance, MD [facility]   IR RADIOLOGY PERIPHERAL GUIDED IV START  03/28/2017   IR REMOVAL TUN CV CATH W/O FL  08/11/2020   IR THROMBECTOMY AV FISTULA  W/THROMBOLYSIS INC/SHUNT/IMG LEFT Left 06/17/2020   IR US GUIDE VASC ACCESS LEFT  06/17/2020   IR US GUIDE VASC ACCESS RIGHT  03/28/2017   IR US GUIDE VASC ACCESS RIGHT  06/17/2020   LEFT HEART CATH AND CORONARY ANGIOGRAPHY N/A 09/20/2016   Procedure: Left Heart Cath and Coronary Angiography;  Surgeon: Lyn Records, MD;  Location: [facility] INVASIVE CV LAB;  Service: Cardiovascular;  Laterality: N/A;   LEFT HEART CATH AND CORS/GRAFTS ANGIOGRAPHY N/A 10/13/2016   Procedure: Left Heart Cath and Cors/Grafts Angiography;  Surgeon: Lennette Bihari, MD;  [facility];  Service: Cardiovascular;  Laterality: N/A;   LEFT HEART CATH AND CORS/GRAFTS ANGIOGRAPHY N/A 07/13/2018   Procedure: LEFT HEART CATH AND CORS/GRAFTS ANGIOGRAPHY;  Surgeon: Swaziland, Peter M, MD;  Location: [facility] INVASIVE CV LAB;  Service: Cardiovascular;  Laterality: N/A;   LEFT HEART CATH AND CORS/GRAFTS ANGIOGRAPHY N/A 07/22/2021   Procedure: LEFT HEART CATH AND CORS/GRAFTS ANGIOGRAPHY;  Surgeon: Runell Gess, MD;  [facility]: [facility];  Service: Cardiovascular;  Laterality: N/A;   LEFT HEART CATHETERIZATION WITH CORONARY ANGIOGRAM N/A 12/15/2011   Procedure: LEFT HEART CATHETERIZATION WITH CORONARY ANGIOGRAM;  Surgeon: Kathleene Hazel, MD;  Location: [facility] CATH LAB;  Service: Cardiovascular;  Laterality: N/A;   LEFT HEART CATHETERIZATION WITH CORONARY ANGIOGRAM N/A 01/10/2012   Procedure: LEFT HEART CATHETERIZATION WITH CORONARY ANGIOGRAM;  Surgeon: Peter M Swaziland, MD;  Location: [facility] CATH LAB;  Service: Cardiovascular;  Laterality: N/A;   LEFT HEART CATHETERIZATION WITH CORONARY ANGIOGRAM N/A 06/08/2012   Procedure: LEFT HEART CATHETERIZATION WITH CORONARY ANGIOGRAM;  Surgeon: Kathleene Hazel, MD;  Location: [facility] CATH LAB;  Service: Cardiovascular;  Laterality: N/A;   LEFT HEART CATHETERIZATION WITH CORONARY/GRAFT ANGIOGRAM N/A 12/10/2013   Procedure: LEFT HEART CATHETERIZATION WITH Isabel Caprice;  Surgeon: Corky Crafts, MD;  Location: [facility] CATH LAB;  Service: Cardiovascular;  Laterality: N/A;   OVARY SURGERY     ovarian cancer   POLYPECTOMY  03/09/2019   Procedure: POLYPECTOMY;  Surgeon: Malissa Hippo, MD;  [facility];  Service: Endoscopy;;  cecal    POLYPECTOMY N/A 09/26/2019   Procedure: DUODENAL POLYPECTOMY;  Surgeon: Malissa Hippo, MD;  [facility];  Service: Endoscopy;  Laterality: N/A;   POLYPECTOMY  08/11/2022   Procedure: POLYPECTOMY;  Surgeon: Beverley Fiedler, MD;  Location: [facility] ENDOSCOPY;  Service: Gastroenterology;;   REVISION OF ARTERIOVENOUS GORETEX GRAFT N/A 02/24/2017   Procedure: REVISION OF ARTERIOVENOUS GORETEX GRAFT (RESECTION);  Surgeon: Renford Dills, MD;  Location: [facility] ORS;  Service: Vascular;  Laterality: N/A;   REVISON OF ARTERIOVENOUS FISTULA Left 06/19/2020   Procedure: REVISION OF LEFT UPPER ARM AV GRAFT WITH INTERPOSITION JUMP GRAFT USING GORE LIMB;  Surgeon: Cephus Shelling, MD;  Location: [facility] OR;  Service: Vascular;  Laterality: Left;   SHUNTOGRAM N/A 10/15/2013   Procedure: Fistulogram;  Surgeon: Nada Libman, MD;  Location: [facility] CATH LAB;  Service: Cardiovascular;  Laterality: N/A;   THROMBECTOMY / ARTERIOVENOUS GRAFT REVISION  2011   left upper arm   TUBAL LIGATION  1980's   UPPER EXTREMITY ANGIOGRAPHY Bilateral 12/06/2016   Procedure: Upper Extremity Angiography;  Surgeon: Renford Dills, MD;  Location: [facility] INVASIVE CV LAB;  Service: Cardiovascular;  Laterality: Bilateral;   UPPER EXTREMITY INTERVENTION Left 06/06/2017   Procedure: UPPER EXTREMITY INTERVENTION;  Surgeon: Renford Dills, MD;  Location: [facility] INVASIVE CV LAB;  Service: Cardiovascular;  Laterality: Left;   UPPER EXTREMITY VENOGRAPHY Left 10/04/2022   Procedure: UPPER EXTREMITY VENOGRAPHY;  Surgeon: Renford Dills, MD;  Location: [facility] INVASIVE CV LAB;  Service: Cardiovascular;  Laterality: Left;    Social History:  reports that she has never smoked. She  has never used smokeless tobacco. She reports that she does not drink alcohol and does not use drugs.   Allergies  Allergen Reactions   Amlodipine Swelling   Aspirin Other (See Comments)    High Doses Mess up her stomach; "makes my bowels have blood in them". Takes 81 mg EC Aspirin    Nitrofurantoin Hives   Bactrim [Sulfamethoxazole-Trimethoprim] Rash   Contrast Media [Iodinated Contrast Media] Itching   Iron Itching and Other (See Comments)    "they gave me  iron in dialysis; had to give me Benadryl cause I had to have the iron" (05/02/2012)   Tylenol [Acetaminophen] Itching and Other (See Comments)    Makes her feet on fire per pt - can tolerate per pt   Gabapentin Other (See Comments)    Unknown reaction   Iron Sucrose Other (See Comments)    Unknown   Ranexa [Ranolazine] Other (See Comments)    Myoclonus-hospitalized    Sucroferric Oxyhydroxide Other (See Comments)    Unknown   Dexilant [Dexlansoprazole] Other (See Comments)    Upset stomach   Hydralazine Itching    Has tolerated while inpatient Has tolerated while inpatient   Levaquin [Levofloxacin In D5w] Rash   Morphine And Related Itching and Other (See Comments)    Itching in feet   Pantoprazole Rash   Plavix [Clopidogrel Bisulfate] Rash   Protonix [Pantoprazole Sodium] Rash   Venofer [Ferric Oxide] Itching and Other (See Comments)    Patient reports using Benadryl prior to doses as [facility]    Family History  Problem Relation Age of Onset   Heart disease Mother        Heart Disease before age 18   Hyperlipidemia Mother    Hypertension Mother    Diabetes Mother    Heart attack Mother    Heart disease Father        Heart Disease before age 82   Hyperlipidemia Father    Hypertension Father    Diabetes Father    Diabetes Sister    Hypertension Sister    Diabetes Brother    Hyperlipidemia Brother    Heart attack Brother    Hypertension Sister    Heart attack Brother    Colon cancer Child 71    Other Other        noncontributory for early CAD   Esophageal cancer Neg Hx    Liver disease Neg Hx    Kidney disease Neg Hx    Colon polyps Neg Hx     Prior to Admission medications   Medication Sig Start Date End Date Taking? Authorizing Provider  amiodarone (PACERONE) 100 MG tablet Take 1 tablet (100 mg total) by mouth daily. 04/27/22  Yes Rhetta Mura, MD  Aspirin 81 MG CAPS Take 1 capsule by mouth daily at 12 noon.   Yes [provider]  azithromycin (ZITHROMAX) 250 MG tablet Take 1 tablet (250 mg total) by mouth daily. Take first 2 tablets together, then 1 every day until finished. 10/02/22  Yes Vanetta Mulders, MD  doxycycline (VIBRAMYCIN) 100 MG capsule Take 1 capsule (100 mg total) by mouth 2 (two) times daily. 10/23/22  Yes Jacalyn Lefevre, MD  folic acid (FOLVITE) 1 MG tablet Take 1 mg by mouth daily. 05/24/22  Yes [provider]  omeprazole (PRILOSEC) 40 MG capsule Take 1 capsule (40 mg total) by mouth daily. 10/06/22  Yes Aida Raider, NP  rosuvastatin (CRESTOR) 10 MG tablet Take 1 tablet by mouth once daily 06/06/22  Yes Strader, [place] M, PA-C  sevelamer carbonate (RENVELA) 800 MG tablet Take 3,200 mg by mouth in the morning and at bedtime. 12/27/21  Yes [provider]  ALPRAZolam Prudy Feeler) 0.5 MG tablet Take 1 tablet (0.5 mg total) by mouth daily as needed for anxiety. 08/17/22   Jerald Kief, MD  benzonatate (TESSALON) 100 MG capsule Take 100 mg by mouth 3 (three) times daily as needed for cough. 05/10/22   [provider]  guaiFENesin-codeine 100-10 MG/5ML syrup Take  5 mLs by mouth every 4 (four) hours as needed for cough. 10/23/22   Jacalyn Lefevre, MD  hydrOXYzine (ATARAX) 25 MG tablet Take 25 mg by mouth every 6 (six) hours as needed for itching. 06/06/22   [provider]  levothyroxine (SYNTHROID) 75 MCG tablet Take 1 tablet (75 mcg total) by mouth daily before breakfast. 08/14/22 10/06/22  Hughie Closs, MD   lisinopril (ZESTRIL) 20 MG tablet Take 1 tablet (20 mg total) by mouth daily. 08/18/22 10/06/22  Jerald Kief, MD  methylPREDNISolone (MEDROL DOSEPAK) 4 MG TBPK tablet Use as directed 10/19/22   Arthor Captain, PA-C  metoprolol tartrate (LOPRESSOR) 25 MG tablet Take 0.5 tablets (12.5 mg total) by mouth 2 (two) times daily. 03/04/22   Antoine Poche, MD  multivitamin (RENA-VIT) TABS tablet Take 1 tablet by mouth daily.    [provider]  nitroGLYCERIN (NITROSTAT) 0.4 MG SL tablet Place 0.4 mg under the tongue every 5 (five) minutes as needed for chest pain.    [provider]  ondansetron (ZOFRAN-ODT) 4 MG disintegrating tablet Take 4 mg by mouth every 8 (eight) hours as needed for nausea or vomiting. 05/05/22   [provider]    Physical Exam: BP (!) 144/69 (BP Location: Right Arm)   Pulse (!) 51   Temp 97.9 F (36.6 C) (Oral)   Resp 20   Ht 5\' 5"  (1.651 m)   Wt 56.2 kg   SpO2 99%   BMI 20.62 kg/m   General: 82 y.o. year-old female well developed well nourished in no acute distress.  Alert and oriented x3. HEENT: NCAT, EOMI Neck: Supple, trachea medial Cardiovascular: Regular rate and rhythm with no rubs or gallops.  No thyromegaly or JVD noted.  No lower extremity edema. 2/4 pulses in all 4 extremities. Respiratory: Clear to auscultation with no wheezes or rales. Good inspiratory effort. Abdomen: Soft, nontender nondistended with normal bowel sounds x4 quadrants. Muskuloskeletal: No cyanosis, clubbing or edema noted bilaterally Neuro: CN II-XII intact, strength 5/5 x 4, sensation, reflexes intact Skin: No ulcerative lesions noted or rashes Psychiatry: Judgement and insight appear normal. Mood is appropriate for condition and setting          Labs on Admission:  Basic Metabolic Panel: Recent Labs  Lab 10/23/22 1418 10/28/22 2129  NA 140 136  K 4.9 3.5  CL 101 99  CO2 23 25  GLUCOSE 108* 136*  BUN 44* 23  CREATININE 6.19* 3.63*  CALCIUM  9.4 8.1*   Liver Function Tests: No results for input(s): "AST", "ALT", "ALKPHOS", "BILITOT", "PROT", "ALBUMIN" in the last 168 hours. No results for input(s): "LIPASE", "AMYLASE" in the last 168 hours. No results for input(s): "AMMONIA" in the last 168 hours. CBC: Recent Labs  Lab 10/23/22 1418 10/28/22 2129  WBC 6.8 6.2  NEUTROABS 5.4 4.6  HGB 8.5* 7.1*  HCT 26.8* 22.1*  MCV 120.2* 120.8*  PLT 309 215   Cardiac Enzymes: No results for input(s): "CKTOTAL", "CKMB", "CKMBINDEX", "TROPONINI" in the last 168 hours.  BNP (last 3 results) Recent Labs    08/06/22 0139 08/26/22 1410 10/23/22 1420  BNP 1,666.2* 1,590.0* 2,205.0*    ProBNP (last 3 results) No results for input(s): "PROBNP" in the last 8760 hours.  CBG: No results for input(s): "GLUCAP" in the last 168 hours.  Radiological Exams on Admission: DG Chest Port 1 View  Result Date: 10/28/2022 CLINICAL DATA:  Cough EXAM: PORTABLE CHEST 1 VIEW COMPARISON:  10/23/2022 FINDINGS: Prior CABG. Left dialysis catheter  remains in place with the tip in the right atrium. Mild cardiomegaly. No overt edema. Bibasilar atelectasis. No effusions or acute bony abnormality. IMPRESSION: Cardiomegaly, bibasilar atelectasis. Electronically Signed   By: Charlett Nose M.D.   On: 10/28/2022 22:15    EKG: I independently viewed the EKG done and my findings are as followed: EKG showed wide QRS rhythm at a rate of 60 bpm with LBBB and QTc of 514 ms   Assessment/Plan Present on Admission:  Symptomatic anemia  Acute on chronic blood loss anemia  CAD (coronary artery disease)  Acquired hypothyroidism  Essential hypertension  Atrial fibrillation, chronic  Active Problems:   Essential hypertension   End-stage renal disease on hemodialysis   CAD (coronary artery disease)   Diabetes mellitus type 2 in nonobese   Acquired hypothyroidism   Atrial fibrillation, chronic   Acute on chronic blood loss anemia   Symptomatic anemia   Anemia of  chronic disease  Symptomatic anemia Acute on chronic blood loss anemia Anemia of chronic disease Type and screen was done, 1 unit of blood was ordered to be transfused in the ED FOBT pending Gastroenterology will be consulted and we shall await further recommendation  ESRD on HD (MWF) Last HD was yesterday per patient Nephrology will be consulted for maintenance dialysis  CAD Status post CABG, continue to hold aspirin due to above, continue with metoprolol and Crestor  Essential hypertension Continue with metoprolol for now, hold lisinopril    Atrial fibrillation Patient was not a candidate for anticoagulation Continue with amiodarone and metoprolol   Acquired hypothyroidism Continue with Synthroid   Diabetes mellitus Most recent A1c is 5.5 08/07/2022, at this point no indication for any medications     DVT prophylaxis: SCDs  Advance Care Planning: Full code  Consults: Gastroenterology  Family Communication: None at bedside  Severity of Illness: The appropriate patient status for this patient is OBSERVATION. Observation status is judged to be reasonable and necessary in order to provide the required intensity of service to ensure the patient's safety. The patient's presenting symptoms, physical exam findings, and initial radiographic and laboratory data in the context of their medical condition is felt to place them at decreased risk for further clinical deterioration. Furthermore, it is anticipated that the patient will be medically stable for discharge from the hospital within 2 midnights of admission.   Author: Frankey Shown, DO 10/29/2022 7:22 AM  For on call review [email or web address].

## 2022-10-29 NOTE — Progress Notes (Signed)
Phlebotomy unable to stick patient for morning labs. MD Benjamine Mola notified.

## 2022-10-29 NOTE — Consult Note (Signed)
[name], M.D. Gastroenterology & Hepatology                                           Patient Name: Shawna [name] Account #: @FLAACCTNO @   MRN: [id number] Admission Date: 10/28/2022 Date of Evaluation:  10/29/2022 Time of Evaluation: 10:54 AM   Referring Physician: Marlin Canary, DO  Chief Complaint: Recurrent iron deficiency anemia  HPI:  This is a 82 y.o. female with a history of dementia, ESRD on HD, Afib on ASA, CAD, congestive heart failure, small bowel AVMs, GERD, hypertension, colon cancer s/p resection in 1992,  duodenal adenoma, who came to the hospital after presenting weakness and fatigue after undergoing dialysis yesterday.  Gastroenterology was consulted for recurrent iron deficiency anemia.  The patient is a poor historian and does not remember exactly why she was brought to the hospital.  Per medical chart, the patient was presenting fatigue and was brought after her dialysis was performed.  The patient denies have any melena or hematochezia, nausea, vomiting, fever, chills, abdominal distention.  She has felt very tired frequently.  Notably, the patient was admitted to the hospital back in mid February 2024   Due to similar complaints.  At that time she underwent an EGD which showed mild gastric AVMs treated with APC.  Capsule endoscopy was deployed and showed a few small AVMs in the small bowel possibly in the jejunum, as well as 2 small polyps in the distal small bowel.  In the ED, she was hypertensive 190/62 with heart rate in the 60s, and afebrile. Labs were remarkable for hemoglobin of 7.1, WBC 6.2, platelets 215, BMP with creatinine 3.63, BUN 23 and normal electrolytes.  Ordered to have 1 unit of PRBC but given presence of antibodies this has not been given yet.   Past Medical History: SEE CHRONIC ISSSUES: Past Medical History:  Diagnosis Date   Acute on chronic respiratory failure with hypoxia 10/10/2016   Anxiety    Arthritis    AVM (arteriovenous  malformation) of colon    CAD (coronary artery disease)    a. s/p CABG in 2013 b. DES to D1 in 10/2016. c. cath in 07/2018 showing patent grafts with occlusion of D1 at prior stent site and progression of PDA disease --> medical management recommended   Carotid artery disease    a. 60-79% LICA, 03/2012    Chronic bronchitis    Chronic HFrEF (heart failure with reduced ejection fraction)    Colon cancer 1992   Esophageal stricture    ESRD on hemodialysis    ESRD due to HTN, started dialysis 2011 and gets HD at [facility] with Dr Fausto Skillern on MWF schedule.  Access is LUA AVF as of Sept 2014.    GERD (gastroesophageal reflux disease)    High cholesterol 12/2011   History of blood transfusion 07/2011; 12/2011; 01/2012 X 2; 04/2012   History of gout    History of lower GI bleeding    Hypertension    Iron deficiency anemia    Jugular vein occlusion, right    Mitral regurgitation    a. Moderate by echo, 02/2012   Mitral valve disease    NSVT (nonsustained ventricular tachycardia)    Ovarian cancer 1992   PAF (paroxysmal atrial fibrillation)    Pneumonia ~ 2009   PUD (peptic ulcer disease)    TIA (transient ischemic attack)  Tricuspid valve disease    Past Surgical History:  Past Surgical History:  Procedure Laterality Date   A/V FISTULAGRAM Left 10/04/2022   Procedure: A/V Fistulagram;  Surgeon: Renford Dills, MD;  Location: [facility] INVASIVE CV LAB;  Service: Cardiovascular;  Laterality: Left;   A/V SHUNTOGRAM Left 03/19/2019   Procedure: A/V SHUNTOGRAM;  Surgeon: Renford Dills, MD;  Location: [facility] INVASIVE CV LAB;  Service: Cardiovascular;  Laterality: Left;   ABDOMINAL HYSTERECTOMY  1992   APPENDECTOMY  06/1990   AV FISTULA PLACEMENT  07/2009   left upper arm   AV FISTULA PLACEMENT Right 09/06/2016   Procedure: RIGHT FOREARM ARTERIOVENOUS (AV) GRAFT;  Surgeon: Sherren Kerns, MD;  Location: [facility] OR;  Service: Vascular;  Laterality: Right;   AV FISTULA PLACEMENT N/A 02/24/2017    Procedure: INSERTION OF ARTERIOVENOUS (AV) GORE-TEX GRAFT ARM (BRACHIAL AXILLARY);  Surgeon: Renford Dills, MD;  Location: [facility] ORS;  Service: Vascular;  Laterality: N/A;   AVGG REMOVAL Right 09/06/2016   Procedure: REMOVAL OF Right Arm ARTERIOVENOUS GORETEX GRAFT and Vein Patch angioplasty of brachial artery;  Surgeon: Chuck Hint, MD;  Location: [facility] OR;  Service: Vascular;  Laterality: Right;   BIOPSY  09/26/2019   Procedure: BIOPSY;  Surgeon: Malissa Hippo, MD;  [facility];  Service: Endoscopy;;   COLON RESECTION  1992   COLON SURGERY     COLONOSCOPY N/A 03/09/2019   Procedure: COLONOSCOPY;  Surgeon: Malissa Hippo, MD;  [facility];  Service: Endoscopy;  Laterality: N/A;   COLONOSCOPY N/A 08/11/2022   Procedure: COLONOSCOPY;  Surgeon: Beverley Fiedler, MD;  Location: [facility] ENDOSCOPY;  Service: Gastroenterology;  Laterality: N/A;   COLONOSCOPY WITH PROPOFOL N/A 06/08/2021   Procedure: COLONOSCOPY WITH PROPOFOL;  Surgeon: Dolores Frame, MD;  [facility];  Service: Gastroenterology;  Laterality: N/A;  9:05 /Patient is on dialysis Mon Wed Fri   CORONARY ANGIOPLASTY WITH STENT PLACEMENT  12/15/11   "2"   CORONARY ANGIOPLASTY WITH STENT PLACEMENT  y/2013   "1; makes total of 3" (05/02/2012)   CORONARY ARTERY BYPASS GRAFT  06/13/2012   Procedure: CORONARY ARTERY BYPASS GRAFTING (CABG);  Surgeon: Delight Ovens, MD;  Location: [facility] OR;  Service: Open Heart Surgery;  Laterality: N/A;  cabg x four;  using left internal mammary artery, and left leg greater saphenous vein harvested endoscopically   CORONARY STENT INTERVENTION N/A 10/13/2016   Procedure: Coronary Stent Intervention;  Surgeon: Lennette Bihari, MD;  [facility];  Service: Cardiovascular;  Laterality: N/A;   DIALYSIS/PERMA CATHETER REMOVAL N/A 04/18/2017   Procedure: DIALYSIS/PERMA CATHETER REMOVAL;  Surgeon: Renford Dills, MD;  Location: [facility] INVASIVE CV LAB;   Service: Cardiovascular;  Laterality: N/A;   DILATION AND CURETTAGE OF UTERUS     ENTEROSCOPY N/A 06/08/2021   Procedure: PUSH ENTEROSCOPY;  Surgeon: Dolores Frame, MD;  [facility];  Service: Gastroenterology;  Laterality: N/A;   ESOPHAGOGASTRODUODENOSCOPY  01/20/2012   Procedure: ESOPHAGOGASTRODUODENOSCOPY (EGD);  Surgeon: Meryl Dare, MD,FACG;  Location: [facility] ENDOSCOPY;  Service: Endoscopy;  Laterality: N/A;   ESOPHAGOGASTRODUODENOSCOPY N/A 03/26/2013   Procedure: ESOPHAGOGASTRODUODENOSCOPY (EGD);  Surgeon: Hilarie Fredrickson, MD;  Location: [facility] ENDOSCOPY;  Service: Endoscopy;  Laterality: N/A;   ESOPHAGOGASTRODUODENOSCOPY N/A 04/30/2015   Procedure: ESOPHAGOGASTRODUODENOSCOPY (EGD);  Surgeon: Malissa Hippo, MD;  [facility];  Service: Endoscopy;  Laterality: N/A;  1pm - moved to 10/20 @ 1:10   ESOPHAGOGASTRODUODENOSCOPY N/A  07/29/2016   Procedure: ESOPHAGOGASTRODUODENOSCOPY (EGD);  Surgeon: Ruffin Frederick, MD;  Location: [facility] ENDOSCOPY;  Service: Gastroenterology;  Laterality: N/A;  enteroscopy   ESOPHAGOGASTRODUODENOSCOPY N/A 09/26/2019   Procedure: ESOPHAGOGASTRODUODENOSCOPY (EGD);  Surgeon: Malissa Hippo, MD;  [facility];  Service: Endoscopy;  Laterality: N/A;  1250   ESOPHAGOGASTRODUODENOSCOPY N/A 08/11/2022   Procedure: ESOPHAGOGASTRODUODENOSCOPY (EGD);  Surgeon: Beverley Fiedler, MD;  Location: [facility] ENDOSCOPY;  Service: Gastroenterology;  Laterality: N/A;   ESOPHAGOGASTRODUODENOSCOPY (EGD) WITH PROPOFOL N/A 02/05/2021   Procedure: ESOPHAGOGASTRODUODENOSCOPY (EGD) WITH PROPOFOL;  Surgeon: Lanelle Bal, DO;  [facility];  Service: Endoscopy;  Laterality: N/A;   ESOPHAGOGASTRODUODENOSCOPY (EGD) WITH PROPOFOL N/A 08/27/2022   Procedure: ESOPHAGOGASTRODUODENOSCOPY (EGD) WITH PROPOFOL;  Surgeon: Lanelle Bal, DO;  [facility];  Service: Endoscopy;  Laterality: N/A;   GIVENS CAPSULE STUDY N/A 03/07/2019   Procedure:  GIVENS CAPSULE STUDY;  Surgeon: Malissa Hippo, MD;  [facility];  Service: Endoscopy;  Laterality: N/A;  7:30   GIVENS CAPSULE STUDY N/A 04/22/2021   Procedure: GIVENS CAPSULE STUDY;  Surgeon: Malissa Hippo, MD;  [facility];  Service: Endoscopy;  Laterality: N/A;  7:30   GIVENS CAPSULE STUDY N/A 08/27/2022   Procedure: GIVENS CAPSULE STUDY;  Surgeon: Lanelle Bal, DO;  [facility];  Service: Endoscopy;  Laterality: N/A;   HOT HEMOSTASIS N/A 08/11/2022   Procedure: HOT HEMOSTASIS (ARGON PLASMA COAGULATION/BICAP);  Surgeon: Beverley Fiedler, MD;  Location: [facility] ENDOSCOPY;  Service: Gastroenterology;  Laterality: N/A;   INSERTION OF DIALYSIS CATHETER N/A 10/05/2020   Procedure: ABORTED TUNNELED DIALYSIS CATHETER PLACEMENT RIGHT INTERNAL JUGULAR VEIN ;  Surgeon: Lucretia Roers, MD;  Location: [facility] ORS;  Service: General;  Laterality: N/A;   INTRAOPERATIVE TRANSESOPHAGEAL ECHOCARDIOGRAM  06/13/2012   Procedure: INTRAOPERATIVE TRANSESOPHAGEAL ECHOCARDIOGRAM;  Surgeon: Delight Ovens, MD;  Location: [facility] OR;  Service: Open Heart Surgery;  Laterality: N/A;   IR DIALY SHUNT INTRO NEEDLE/INTRACATH INITIAL W/IMG LEFT Left 10/06/2020   IR FLUORO GUIDE CV LINE RIGHT  06/17/2020   IR GENERIC HISTORICAL  07/26/2016   IR FLUORO GUIDE CV LINE RIGHT 07/26/2016 Berdine Dance, MD [facility]   IR GENERIC HISTORICAL  07/26/2016   IR US GUIDE VASC ACCESS RIGHT 07/26/2016 Berdine Dance, MD [facility]   IR GENERIC HISTORICAL  08/02/2016   IR US GUIDE VASC ACCESS RIGHT 08/02/2016 Berdine Dance, MD [facility]   IR GENERIC HISTORICAL  08/02/2016   IR FLUORO GUIDE CV LINE RIGHT 08/02/2016 Berdine Dance, MD [facility]   IR RADIOLOGY PERIPHERAL GUIDED IV START  03/28/2017   IR REMOVAL TUN CV CATH W/O FL  08/11/2020   IR THROMBECTOMY AV FISTULA W/THROMBOLYSIS INC/SHUNT/IMG LEFT Left 06/17/2020   IR US GUIDE VASC ACCESS LEFT  06/17/2020   IR US GUIDE VASC ACCESS RIGHT  03/28/2017   IR  US GUIDE VASC ACCESS RIGHT  06/17/2020   LEFT HEART CATH AND CORONARY ANGIOGRAPHY N/A 09/20/2016   Procedure: Left Heart Cath and Coronary Angiography;  Surgeon: Lyn Records, MD;  Location: [facility] INVASIVE CV LAB;  Service: Cardiovascular;  Laterality: N/A;   LEFT HEART CATH AND CORS/GRAFTS ANGIOGRAPHY N/A 10/13/2016   Procedure: Left Heart Cath and Cors/Grafts Angiography;  Surgeon: Lennette Bihari, MD;  Location: [facility];  Service: Cardiovascular;  Laterality: N/A;   LEFT HEART CATH AND CORS/GRAFTS ANGIOGRAPHY N/A 07/13/2018   Procedure: LEFT HEART CATH AND CORS/GRAFTS ANGIOGRAPHY;  Surgeon:  Swaziland, Peter M, MD;  Location: [facility] INVASIVE CV LAB;  Service: Cardiovascular;  Laterality: N/A;   LEFT HEART CATH AND CORS/GRAFTS ANGIOGRAPHY N/A 07/22/2021   Procedure: LEFT HEART CATH AND CORS/GRAFTS ANGIOGRAPHY;  Surgeon: Runell Gess, MD;  Location: [facility];  Service: Cardiovascular;  Laterality: N/A;   LEFT HEART CATHETERIZATION WITH CORONARY ANGIOGRAM N/A 12/15/2011   Procedure: LEFT HEART CATHETERIZATION WITH CORONARY ANGIOGRAM;  Surgeon: Kathleene Hazel, MD;  Location: [facility] CATH LAB;  Service: Cardiovascular;  Laterality: N/A;   LEFT HEART CATHETERIZATION WITH CORONARY ANGIOGRAM N/A 01/10/2012   Procedure: LEFT HEART CATHETERIZATION WITH CORONARY ANGIOGRAM;  Surgeon: Peter M Swaziland, MD;  Location: [facility] CATH LAB;  Service: Cardiovascular;  Laterality: N/A;   LEFT HEART CATHETERIZATION WITH CORONARY ANGIOGRAM N/A 06/08/2012   Procedure: LEFT HEART CATHETERIZATION WITH CORONARY ANGIOGRAM;  Surgeon: Kathleene Hazel, MD;  Location: [facility] CATH LAB;  Service: Cardiovascular;  Laterality: N/A;   LEFT HEART CATHETERIZATION WITH CORONARY/GRAFT ANGIOGRAM N/A 12/10/2013   Procedure: LEFT HEART CATHETERIZATION WITH Isabel Caprice;  Surgeon: Corky Crafts, MD;  Location: [facility] CATH LAB;  Service: Cardiovascular;  Laterality: N/A;   OVARY SURGERY     ovarian cancer   POLYPECTOMY   03/09/2019   Procedure: POLYPECTOMY;  Surgeon: Malissa Hippo, MD;  [facility];  Service: Endoscopy;;  cecal    POLYPECTOMY N/A 09/26/2019   Procedure: DUODENAL POLYPECTOMY;  Surgeon: Malissa Hippo, MD;  [facility];  Service: Endoscopy;  Laterality: N/A;   POLYPECTOMY  08/11/2022   Procedure: POLYPECTOMY;  Surgeon: Beverley Fiedler, MD;  Location: [facility] ENDOSCOPY;  Service: Gastroenterology;;   REVISION OF ARTERIOVENOUS GORETEX GRAFT N/A 02/24/2017   Procedure: REVISION OF ARTERIOVENOUS GORETEX GRAFT (RESECTION);  Surgeon: Renford Dills, MD;  Location: [facility] ORS;  Service: Vascular;  Laterality: N/A;   REVISON OF ARTERIOVENOUS FISTULA Left 06/19/2020   Procedure: REVISION OF LEFT UPPER ARM AV GRAFT WITH INTERPOSITION JUMP GRAFT USING GORE LIMB;  Surgeon: Cephus Shelling, MD;  Location[facility] OR;  Service: Vascular;  Laterality: Left;   SHUNTOGRAM N/A 10/15/2013   Procedure: Fistulogram;  Surgeon: Nada Libman, MD;  Location: [facility] CATH LAB;  Service: Cardiovascular;  Laterality: N/A;   THROMBECTOMY / ARTERIOVENOUS GRAFT REVISION  2011   left upper arm   TUBAL LIGATION  1980's   UPPER EXTREMITY ANGIOGRAPHY Bilateral 12/06/2016   Procedure: Upper Extremity Angiography;  Surgeon: Renford Dills, MD;  Location: [facility] INVASIVE CV LAB;  Service: Cardiovascular;  Laterality: Bilateral;   UPPER EXTREMITY INTERVENTION Left 06/06/2017   Procedure: UPPER EXTREMITY INTERVENTION;  Surgeon: Renford Dills, MD;  Location: [facility] INVASIVE CV LAB;  Service: Cardiovascular;  Laterality: Left;   UPPER EXTREMITY VENOGRAPHY Left 10/04/2022   Procedure: UPPER EXTREMITY VENOGRAPHY;  Surgeon: Renford Dills, MD;  Location: [facility] INVASIVE CV LAB;  Service: Cardiovascular;  Laterality: Left;   Family History:  Family History  Problem Relation Age of Onset   Heart disease Mother        Heart Disease before age 46   Hyperlipidemia Mother    Hypertension Mother    Diabetes Mother     Heart attack Mother    Heart disease Father        Heart Disease before age 9   Hyperlipidemia Father    Hypertension Father    Diabetes Father    Diabetes Sister    Hypertension Sister    Diabetes Brother    Hyperlipidemia  Brother    Heart attack Brother    Hypertension Sister    Heart attack Brother    Colon cancer Child 10   Other Other        noncontributory for early CAD   Esophageal cancer Neg Hx    Liver disease Neg Hx    Kidney disease Neg Hx    Colon polyps Neg Hx    Social History:  Social History   Tobacco Use   Smoking status: Never   Smokeless tobacco: Never  Vaping Use   Vaping Use: Never used  Substance Use Topics   Alcohol use: No    Alcohol/week: 0.0 standard drinks of alcohol   Drug use: No    Home Medications:  Prior to Admission medications   Medication Sig Start Date End Date Taking? Authorizing Provider  amiodarone (PACERONE) 100 MG tablet Take 1 tablet (100 mg total) by mouth daily. 04/27/22  Yes Rhetta Mura, MD  Aspirin 81 MG CAPS Take 1 capsule by mouth daily at 12 noon.   Yes [provider]  azithromycin (ZITHROMAX) 250 MG tablet Take 1 tablet (250 mg total) by mouth daily. Take first 2 tablets together, then 1 every day until finished. 10/02/22  Yes Vanetta Mulders, MD  doxycycline (VIBRAMYCIN) 100 MG capsule Take 1 capsule (100 mg total) by mouth 2 (two) times daily. 10/23/22  Yes Jacalyn Lefevre, MD  folic acid (FOLVITE) 1 MG tablet Take 1 mg by mouth daily. 05/24/22  Yes [provider]  omeprazole (PRILOSEC) 40 MG capsule Take 1 capsule (40 mg total) by mouth daily. 10/06/22  Yes Aida Raider, NP  rosuvastatin (CRESTOR) 10 MG tablet Take 1 tablet by mouth once daily 06/06/22  Yes Strader, [place] M, PA-C  sevelamer carbonate (RENVELA) 800 MG tablet Take 3,200 mg by mouth in the morning and at bedtime. 12/27/21  Yes [provider]  ALPRAZolam Prudy Feeler) 0.5 MG tablet Take 1 tablet (0.5 mg total) by  mouth daily as needed for anxiety. 08/17/22   Jerald Kief, MD  benzonatate (TESSALON) 100 MG capsule Take 100 mg by mouth 3 (three) times daily as needed for cough. 05/10/22   [provider]  guaiFENesin-codeine 100-10 MG/5ML syrup Take 5 mLs by mouth every 4 (four) hours as needed for cough. 10/23/22   Jacalyn Lefevre, MD  hydrOXYzine (ATARAX) 25 MG tablet Take 25 mg by mouth every 6 (six) hours as needed for itching. 06/06/22   [provider]  levothyroxine (SYNTHROID) 75 MCG tablet Take 1 tablet (75 mcg total) by mouth daily before breakfast. 08/14/22 10/06/22  Hughie Closs, MD  lisinopril (ZESTRIL) 20 MG tablet Take 1 tablet (20 mg total) by mouth daily. 08/18/22 10/06/22  Jerald Kief, MD  methylPREDNISolone (MEDROL DOSEPAK) 4 MG TBPK tablet Use as directed 10/19/22   Arthor Captain, PA-C  metoprolol tartrate (LOPRESSOR) 25 MG tablet Take 0.5 tablets (12.5 mg total) by mouth 2 (two) times daily. 03/04/22   Antoine Poche, MD  multivitamin (RENA-VIT) TABS tablet Take 1 tablet by mouth daily.    [provider]  nitroGLYCERIN (NITROSTAT) 0.4 MG SL tablet Place 0.4 mg under the tongue every 5 (five) minutes as needed for chest pain.    [provider]  ondansetron (ZOFRAN-ODT) 4 MG disintegrating tablet Take 4 mg by mouth every 8 (eight) hours as needed for nausea or vomiting. 05/05/22   [provider]    Inpatient Medications:  Current Facility-Administered Medications:    amiodarone (PACERONE)  tablet 100 mg, 100 mg, Oral, Daily, Adefeso, Olad[place], DO   metoprolol tartrate (LOPRESSOR) tablet 12.5 mg, 12.5 mg, Oral, BID, Adefeso, Oladapo, DO   ondansetron (ZOFRAN) tablet 4 mg, 4 mg, Oral, Q6H PRN **OR** ondansetron (ZOFRAN) injection 4 mg, 4 mg, Intravenous, Q6H PRN, Adefeso, Oladapo, DO   rosuvastatin (CRESTOR) tablet 10 mg, 10 mg, Oral, Daily, Adefeso, Oladapo, DO Allergies: Amlodipine, Aspirin, Nitrofurantoin, Bactrim  [sulfamethoxazole-trimethoprim], Contrast media [iodinated contrast media], Iron, Tylenol [acetaminophen], Gabapentin, Iron sucrose, Ranexa [ranolazine], Sucroferric oxyhydroxide, Dexilant [dexlansoprazole], Hydralazine, Levaquin [levofloxacin in d5w], Morphine and related, Pantoprazole, Plavix [clopidogrel bisulfate], Protonix [pantoprazole sodium], and Venofer [ferric oxide]  Complete Review of Systems: GENERAL: negative for malaise, night sweats HEENT: No changes in hearing or vision, no nose bleeds or other nasal problems. NECK: Negative for lumps, goiter, pain and significant neck swelling RESPIRATORY: Negative for cough, wheezing CARDIOVASCULAR: Negative for chest pain, leg swelling, palpitations, orthopnea GI: SEE HPI MUSCULOSKELETAL: Negative for joint pain or swelling, back pain, and muscle pain. SKIN: Negative for lesions, rash PSYCH: Negative for sleep disturbance, mood disorder and recent psychosocial stressors. HEMATOLOGY Negative for prolonged bleeding, bruising easily, and swollen nodes. ENDOCRINE: Negative for cold or heat intolerance, polyuria, polydipsia and goiter. NEURO: negative for tremor, gait imbalance, syncope and seizures. The remainder of the review of systems is noncontributory.  Physical Exam: BP (!) 144/69 (BP Location: Right Arm)   Pulse (!) 51   Temp 97.9 F (36.6 C) (Oral)   Resp 20   Ht  (1.651 m)   Wt 56.2 kg   SpO2 99%   BMI 20.62 kg/m  GENERAL: The patient is AO x2-3, in no acute distress. HEENT: Head is normocephalic and atraumatic. EOMI are intact. Mouth is well hydrated and without lesions. NECK: Supple. No masses LUNGS: Clear to auscultation. No presence of rhonchi/wheezing/rales. Adequate chest expansion HEART: RRR, normal s1 and s2. ABDOMEN: Soft, nontender, no guarding, no peritoneal signs, and nondistended. BS +. No masses. EXTREMITIES: Without any cyanosis, clubbing, rash, lesions or edema. NEUROLOGIC: Aox2-3, no focal motor  deficit. SKIN: no jaundice, no rashes  Laboratory Data CBC:     Component Value Date/Time   WBC 6.2 10/28/2022 2129   RBC 1.83 (L) 10/28/2022 2129   HGB 7.1 (L) 10/28/2022 2129   HCT 22.1 (L) 10/28/2022 2129   PLT 215 10/28/2022 2129   MCV 120.8 (H) 10/28/2022 2129   MCH 38.8 (H) 10/28/2022 2129   MCHC 32.1 10/28/2022 2129   RDW 22.2 (H) 10/28/2022 2129   LYMPHSABS 0.6 (L) 10/28/2022 2129   MONOABS 0.6 10/28/2022 2129   EOSABS 0.2 10/28/2022 2129   BASOSABS 0.1 10/28/2022 2129   COAG:  Lab Results  Component Value Date   INR 1.3 (H) 11/08/2021   INR 1.0 02/03/2021   INR 1.1 06/12/2020    BMP:     Latest Ref Rng & Units 10/28/2022    9:29 PM 10/23/2022    2:18 PM 10/19/2022    2:41 PM  BMP  Glucose 70 - 99 mg/dL 161  096  99   BUN 8 - 23 mg/dL 23  44  57   Creatinine 0.44 - 1.00 mg/dL 0.45  4.09  8.11   Sodium 135 - 145 mmol/L 136  140  132   Potassium 3.5 - 5.1 mmol/L 3.5  4.9  4.4   Chloride 98 - 111 mmol/L 99  101  91   CO2 22 - 32 mmol/L Calcium 8.9 -  10.3 mg/dL 8.1  9.4  8.1     HEPATIC:     Latest Ref Rng & Units 08/29/2022    4:58 AM 08/26/2022    2:10 PM 08/08/2022    9:11 AM  Hepatic Function  Total Protein 6.5 - 8.1 g/dL  6.2    Albumin 3.5 - 5.0 g/dL 3.3  3.1  2.4   AST 15 - 41 U/L  26    ALT 0 - 44 U/L  17    Alk Phosphatase 38 - 126 U/L  85    Total Bilirubin 0.3 - 1.2 mg/dL  0.5      CARDIAC:  Lab Results  Component Value Date   CKTOTAL 47 12/03/2020   CKMB 1.5 05/19/2016   TROPONINI 0.37 (HH) 12/26/2018     Imaging: I personally reviewed and interpreted the available imaging.  Assessment & Plan: Shawna [name] is a 82 y.o. female with a history of dementia, ESRD on HD, Afib on ASA, CAD, congestive heart failure, small bowel AVMs, GERD, hypertension, colon cancer s/p resection in 1992,  duodenal adenoma, who came to the hospital after presenting weakness and fatigue after undergoing dialysis yesterday.  Gastroenterology was  consulted for recurrent iron deficiency anemia.    Patient has presented recurrent iron deficiency anemia without overt testing of bleeding.  It is very likely she has presented recurrent chronic losses from the AVM's she has in her small bowel.  Would not rule out some intermittent losses from the small bowel polyps.  Will recommend transfusing her 1 unit received for now and we will possibly proceed with a push enteroscopy on Monday after she undergoes her dialysis session.  depending on the findings, may consider doing an outpatient colonoscopy for evaluation of the small bowel polyps ( given her comorbidities  and dementia, I will only favor this if her anemia does not improve).  Will start her on octreotide 100 mcg twice daily and she should be started on this  medication as outpatient to decrease the risk of intermittent  blood losses.  - Transfuse 1 UPRBC - Repeat CBC qday, transfuse if Hb <8 - 2 large bore IV lines - Active T/S - Avoid NSAIDs - Will proceed with push enteroscopy possibly on Monday after HD - Start octreotide 100 mcg BID SQ  [name], MD Gastroenterology and Hepatology [address] [facility]astroenter[facility]

## 2022-10-29 NOTE — Progress Notes (Signed)
Admit for symptomatic anemia. Plan for EGD Mon, will need HD prior. Called outpatient HD unit. The following outpatient orders are:  [facility], MWF.  3.5 hours.  EDW 55.5 kg.  Left IJ TDC.  Nipro Elis 17h.  Flow rates 300/500.  2K/2.5 calcium.  Meds: Mircera 200 mcg every 2 weeks (last dose last 4/15), calcitriol 1.75 mcg q. treatment, Sensipar 30 mg q. treatment.  Plan: -Consult to follow on Monday, will be seen then. -Will arrange for HD first shift/a.m. on Monday prior to EGD -Please call with any questions or concerns in the interim  Anthony Sar, MD [facility]

## 2022-10-29 NOTE — ED Notes (Signed)
..ED TO INPATIENT HANDOFF REPORT  ED Nurse Name and Phone #: [phone]  S Name/Age/Gender Shawna [name] 82 y.o. female Room/Bed: APA03/APA03  Code Status   Code Status: Prior  Home/SNF/Other Home Patient oriented to: self, place, time, and situation Is this baseline? Yes   Triage Complete: Triage complete  Chief Complaint Symptomatic anemia [D64.9]  Triage Note Pt arrived vi POV c/o weakness and not feeling well. Pt report being at dialysis earlier today and felt fine. Pt reports being seen here recently and is getting over pneumonia.    Allergies Allergies  Allergen Reactions   Amlodipine Swelling   Aspirin Other (See Comments)    High Doses Mess up her stomach; "makes my bowels have blood in them". Takes 81 mg EC Aspirin    Nitrofurantoin Hives   Bactrim [Sulfamethoxazole-Trimethoprim] Rash   Contrast Media [Iodinated Contrast Media] Itching   Iron Itching and Other (See Comments)    "they gave me iron in dialysis; had to give me Benadryl cause I had to have the iron" (05/02/2012)   Tylenol [Acetaminophen] Itching and Other (See Comments)    Makes her feet on fire per pt - can tolerate per pt   Gabapentin Other (See Comments)    Unknown reaction   Iron Sucrose Other (See Comments)    Unknown   Ranexa [Ranolazine] Other (See Comments)    Myoclonus-hospitalized    Sucroferric Oxyhydroxide Other (See Comments)    Unknown   Dexilant [Dexlansoprazole] Other (See Comments)    Upset stomach   Hydralazine Itching    Has tolerated while inpatient Has tolerated while inpatient   Levaquin [Levofloxacin In D5w] Rash   Morphine And Related Itching and Other (See Comments)    Itching in feet   Pantoprazole Rash   Plavix [Clopidogrel Bisulfate] Rash   Protonix [Pantoprazole Sodium] Rash   Venofer [Ferric Oxide] Itching and Other (See Comments)    Patient reports using Benadryl prior to doses as [facility]    Level of Care/Admitting Diagnosis ED Disposition      ED Disposition  Admit   Condition  --   Comment  Hospital Area: [facility] [[id number]]  Level of Care: Telemetry [5]  Covid Evaluation: Asymptomatic - no recent exposure (last 10 days) testing not required  Diagnosis: Symptomatic anemia [[id number]]  Admitting Physician: Frankey Shown [[id number]]  Attending Physician: Frankey Shown [[id number]]          B Medical/Surgery History Past Medical History:  Diagnosis Date   Acute on chronic respiratory failure with hypoxia 10/10/2016   Anxiety    Arthritis    AVM (arteriovenous malformation) of colon    CAD (coronary artery disease)    a. s/p CABG in 2013 b. DES to D1 in 10/2016. c. cath in 07/2018 showing patent grafts with occlusion of D1 at prior stent site and progression of PDA disease --> medical management recommended   Carotid artery disease    a. 60-79% LICA, 03/2012    Chronic bronchitis    Chronic HFrEF (heart failure with reduced ejection fraction)    Colon cancer 1992   Esophageal stricture    ESRD on hemodialysis    ESRD due to HTN, started dialysis 2011 and gets HD at [facility] with Dr Fausto Skillern on MWF schedule.  Access is LUA AVF as of Sept 2014.    GERD (gastroesophageal reflux disease)    High cholesterol 12/2011   History of blood transfusion 07/2011; 12/2011; 01/2012 X 2; 04/2012   History  of gout    History of lower GI bleeding    Hypertension    Iron deficiency anemia    Jugular vein occlusion, right    Mitral regurgitation    a. Moderate by echo, 02/2012   Mitral valve disease    NSVT (nonsustained ventricular tachycardia)    Ovarian cancer 1992   PAF (paroxysmal atrial fibrillation)    Pneumonia ~ 2009   PUD (peptic ulcer disease)    TIA (transient ischemic attack)    Tricuspid valve disease    Past Surgical History:  Procedure Laterality Date   A/V FISTULAGRAM Left 10/04/2022   Procedure: A/V Fistulagram;  Surgeon: Renford Dills, MD;  Location: [facility] INVASIVE CV LAB;  Service:  Cardiovascular;  Laterality: Left;   A/V SHUNTOGRAM Left 03/19/2019   Procedure: A/V SHUNTOGRAM;  Surgeon: Renford Dills, MD;  Location: [facility] INVASIVE CV LAB;  Service: Cardiovascular;  Laterality: Left;   ABDOMINAL HYSTERECTOMY  1992   APPENDECTOMY  06/1990   AV FISTULA PLACEMENT  07/2009   left upper arm   AV FISTULA PLACEMENT Right 09/06/2016   Procedure: RIGHT FOREARM ARTERIOVENOUS (AV) GRAFT;  Surgeon: Sherren Kerns, MD;  [facility]: [facility] OR;  Service: Vascular;  Laterality: Right;   AV FISTULA PLACEMENT N/A 02/24/2017   Procedure: INSERTION OF ARTERIOVENOUS (AV) GORE-TEX GRAFT ARM (BRACHIAL AXILLARY);  Surgeon: Renford Dills, MD;  Location: [facility] ORS;  Service: Vascular;  Laterality: N/A;   AVGG REMOVAL Right 09/06/2016   Procedure: REMOVAL OF Right Arm ARTERIOVENOUS GORETEX GRAFT and Vein Patch angioplasty of brachial artery;  Surgeon: Chuck Hint, MD;  Location: [facility] OR;  Service: Vascular;  Laterality: Right;   BIOPSY  09/26/2019   Procedure: BIOPSY;  Surgeon: Malissa Hippo, MD;  [facility];  Service: Endoscopy;;   COLON RESECTION  1992   COLON SURGERY     COLONOSCOPY N/A 03/09/2019   Procedure: COLONOSCOPY;  Surgeon: Malissa Hippo, MD;  [facility];  Service: Endoscopy;  Laterality: N/A;   COLONOSCOPY N/A 08/11/2022   Procedure: COLONOSCOPY;  Surgeon: Beverley Fiedler, MD;  Location: [facility] ENDOSCOPY;  Service: Gastroenterology;  Laterality: N/A;   COLONOSCOPY WITH PROPOFOL N/A 06/08/2021   Procedure: COLONOSCOPY WITH PROPOFOL;  Surgeon: Dolores Frame, MD;  [facility];  Service: Gastroenterology;  Laterality: N/A;  9:05 /Patient is on dialysis Mon Wed Fri   CORONARY ANGIOPLASTY WITH STENT PLACEMENT  12/15/11   "2"   CORONARY ANGIOPLASTY WITH STENT PLACEMENT  y/2013   "1; makes total of 3" (05/02/2012)   CORONARY ARTERY BYPASS GRAFT  06/13/2012   Procedure: CORONARY ARTERY BYPASS GRAFTING (CABG);  Surgeon: Delight Ovens, MD;   Location: [facility] OR;  Service: Open Heart Surgery;  Laterality: N/A;  cabg x four;  using left internal mammary artery, and left leg greater saphenous vein harvested endoscopically   CORONARY STENT INTERVENTION N/A 10/13/2016   Procedure: Coronary Stent Intervention;  Surgeon: Lennette Bihari, MD;  [facility];  Service: Cardiovascular;  Laterality: N/A;   DIALYSIS/PERMA CATHETER REMOVAL N/A 04/18/2017   Procedure: DIALYSIS/PERMA CATHETER REMOVAL;  Surgeon: Renford Dills, MD;  Location: [facility] INVASIVE CV LAB;  Service: Cardiovascular;  Laterality: N/A;   DILATION AND CURETTAGE OF UTERUS     ENTEROSCOPY N/A 06/08/2021   Procedure: PUSH ENTEROSCOPY;  Surgeon: Dolores Frame, MD;  [facility];  Service: Gastroenterology;  Laterality: N/A;   ESOPHAGOGASTRODUODENOSCOPY  01/20/2012   Procedure: ESOPHAGOGASTRODUODENOSCOPY (  EGD);  Surgeon: Meryl Dare, MD,FACG;  Location: [facility] ENDOSCOPY;  Service: Endoscopy;  Laterality: N/A;   ESOPHAGOGASTRODUODENOSCOPY N/A 03/26/2013   Procedure: ESOPHAGOGASTRODUODENOSCOPY (EGD);  Surgeon: Hilarie Fredrickson, MD;  Location: [facility] ENDOSCOPY;  Service: Endoscopy;  Laterality: N/A;   ESOPHAGOGASTRODUODENOSCOPY N/A 04/30/2015   Procedure: ESOPHAGOGASTRODUODENOSCOPY (EGD);  Surgeon: Malissa Hippo, MD;  [facility];  Service: Endoscopy;  Laterality: N/A;  1pm - moved to 10/20 @ 1:10   ESOPHAGOGASTRODUODENOSCOPY N/A 07/29/2016   Procedure: ESOPHAGOGASTRODUODENOSCOPY (EGD);  Surgeon: Ruffin Frederick, MD;  Location: [facility] ENDOSCOPY;  Service: Gastroenterology;  Laterality: N/A;  enteroscopy   ESOPHAGOGASTRODUODENOSCOPY N/A 09/26/2019   Procedure: ESOPHAGOGASTRODUODENOSCOPY (EGD);  Surgeon: Malissa Hippo, MD;  [facility];  Service: Endoscopy;  Laterality: N/A;  1250   ESOPHAGOGASTRODUODENOSCOPY N/A 08/11/2022   Procedure: ESOPHAGOGASTRODUODENOSCOPY (EGD);  Surgeon: Beverley Fiedler, MD;  Location: [facility] ENDOSCOPY;  Service:  Gastroenterology;  Laterality: N/A;   ESOPHAGOGASTRODUODENOSCOPY (EGD) WITH PROPOFOL N/A 02/05/2021   Procedure: ESOPHAGOGASTRODUODENOSCOPY (EGD) WITH PROPOFOL;  Surgeon: Lanelle Bal, DO;  [facility];  Service: Endoscopy;  Laterality: N/A;   ESOPHAGOGASTRODUODENOSCOPY (EGD) WITH PROPOFOL N/A 08/27/2022   Procedure: ESOPHAGOGASTRODUODENOSCOPY (EGD) WITH PROPOFOL;  Surgeon: Lanelle Bal, DO;  [facility];  Service: Endoscopy;  Laterality: N/A;   GIVENS CAPSULE STUDY N/A 03/07/2019   Procedure: GIVENS CAPSULE STUDY;  Surgeon: Malissa Hippo, MD;  [facility];  Service: Endoscopy;  Laterality: N/A;  7:30   GIVENS CAPSULE STUDY N/A 04/22/2021   Procedure: GIVENS CAPSULE STUDY;  Surgeon: Malissa Hippo, MD;  [facility];  Service: Endoscopy;  Laterality: N/A;  7:30   GIVENS CAPSULE STUDY N/A 08/27/2022   Procedure: GIVENS CAPSULE STUDY;  Surgeon: Lanelle Bal, DO;  [facility];  Service: Endoscopy;  Laterality: N/A;   HOT HEMOSTASIS N/A 08/11/2022   Procedure: HOT HEMOSTASIS (ARGON PLASMA COAGULATION/BICAP);  Surgeon: Beverley Fiedler, MD;  Location: [facility] ENDOSCOPY;  Service: Gastroenterology;  Laterality: N/A;   INSERTION OF DIALYSIS CATHETER N/A 10/05/2020   Procedure: ABORTED TUNNELED DIALYSIS CATHETER PLACEMENT RIGHT INTERNAL JUGULAR VEIN ;  Surgeon: Lucretia Roers, MD;  Location: [facility] ORS;  Service: General;  Laterality: N/A;   INTRAOPERATIVE TRANSESOPHAGEAL ECHOCARDIOGRAM  06/13/2012   Procedure: INTRAOPERATIVE TRANSESOPHAGEAL ECHOCARDIOGRAM;  Surgeon: Delight Ovens, MD;  Location: [facility] OR;  Service: Open Heart Surgery;  Laterality: N/A;   IR DIALY SHUNT INTRO NEEDLE/INTRACATH INITIAL W/IMG LEFT Left 10/06/2020   IR FLUORO GUIDE CV LINE RIGHT  06/17/2020   IR GENERIC HISTORICAL  07/26/2016   IR FLUORO GUIDE CV LINE RIGHT 07/26/2016 Berdine Dance, MD [facility]   IR GENERIC HISTORICAL  07/26/2016   IR US GUIDE VASC ACCESS  RIGHT 07/26/2016 Berdine Dance, MD [facility]   IR GENERIC HISTORICAL  08/02/2016   IR US GUIDE VASC ACCESS RIGHT 08/02/2016 Berdine Dance, MD [facility]   IR GENERIC HISTORICAL  08/02/2016   IR FLUORO GUIDE CV LINE RIGHT 08/02/2016 Berdine Dance, MD [facility]   IR RADIOLOGY PERIPHERAL GUIDED IV START  03/28/2017   IR REMOVAL TUN CV CATH W/O FL  08/11/2020   IR THROMBECTOMY AV FISTULA W/THROMBOLYSIS INC/SHUNT/IMG LEFT Left 06/17/2020   IR US GUIDE VASC ACCESS LEFT  06/17/2020   IR US GUIDE VASC ACCESS RIGHT  03/28/2017   IR US GUIDE VASC ACCESS RIGHT  06/17/2020   LEFT HEART CATH AND CORONARY ANGIOGRAPHY N/A 09/20/2016   Procedure:  Left Heart Cath and Coronary Angiography;  Surgeon: Lyn Records, MD;  Location: [facility] INVASIVE CV LAB;  Service: Cardiovascular;  Laterality: N/A;   LEFT HEART CATH AND CORS/GRAFTS ANGIOGRAPHY N/A 10/13/2016   Procedure: Left Heart Cath and Cors/Grafts Angiography;  Surgeon: Lennette Bihari, MD;  [facility];  Service: Cardiovascular;  Laterality: N/A;   LEFT HEART CATH AND CORS/GRAFTS ANGIOGRAPHY N/A 07/13/2018   Procedure: LEFT HEART CATH AND CORS/GRAFTS ANGIOGRAPHY;  Surgeon: Swaziland, Peter M, MD;  Location: [facility] INVASIVE CV LAB;  Service: Cardiovascular;  Laterality: N/A;   LEFT HEART CATH AND CORS/GRAFTS ANGIOGRAPHY N/A 07/22/2021   Procedure: LEFT HEART CATH AND CORS/GRAFTS ANGIOGRAPHY;  Surgeon: Runell Gess, MD;  [facility] CV LAB;  Service: Cardiovascular;  Laterality: N/A;   LEFT HEART CATHETERIZATION WITH CORONARY ANGIOGRAM N/A 12/15/2011   Procedure: LEFT HEART CATHETERIZATION WITH CORONARY ANGIOGRAM;  Surgeon: Kathleene Hazel, MD;  Location: [facility] CATH LAB;  Service: Cardiovascular;  Laterality: N/A;   LEFT HEART CATHETERIZATION WITH CORONARY ANGIOGRAM N/A 01/10/2012   Procedure: LEFT HEART CATHETERIZATION WITH CORONARY ANGIOGRAM;  Surgeon: Peter M Swaziland, MD;  Location: [facility] CATH LAB;  Service: Cardiovascular;  Laterality: N/A;   LEFT  HEART CATHETERIZATION WITH CORONARY ANGIOGRAM N/A 06/08/2012   Procedure: LEFT HEART CATHETERIZATION WITH CORONARY ANGIOGRAM;  Surgeon: Kathleene Hazel, MD;  Location: [facility] CATH LAB;  Service: Cardiovascular;  Laterality: N/A;   LEFT HEART CATHETERIZATION WITH CORONARY/GRAFT ANGIOGRAM N/A 12/10/2013   Procedure: LEFT HEART CATHETERIZATION WITH Isabel Caprice;  Surgeon: Corky Crafts, MD;  Location: [facility] CATH LAB;  Service: Cardiovascular;  Laterality: N/A;   OVARY SURGERY     ovarian cancer   POLYPECTOMY  03/09/2019   Procedure: POLYPECTOMY;  Surgeon: Malissa Hippo, MD;  [facility];  Service: Endoscopy;;  cecal    POLYPECTOMY N/A 09/26/2019   Procedure: DUODENAL POLYPECTOMY;  Surgeon: Malissa Hippo, MD;  [facility];  Service: Endoscopy;  Laterality: N/A;   POLYPECTOMY  08/11/2022   Procedure: POLYPECTOMY;  Surgeon: Beverley Fiedler, MD;  Location: [facility] ENDOSCOPY;  Service: Gastroenterology;;   REVISION OF ARTERIOVENOUS GORETEX GRAFT N/A 02/24/2017   Procedure: REVISION OF ARTERIOVENOUS GORETEX GRAFT (RESECTION);  Surgeon: Renford Dills, MD;  Location: [facility] ORS;  Service: Vascular;  Laterality: N/A;   REVISON OF ARTERIOVENOUS FISTULA Left 06/19/2020   Procedure: REVISION OF LEFT UPPER ARM AV GRAFT WITH INTERPOSITION JUMP GRAFT USING GORE LIMB;  Surgeon: Cephus Shelling, MD;  Location: [facility] OR;  Service: Vascular;  Laterality: Left;   SHUNTOGRAM N/A 10/15/2013   Procedure: Fistulogram;  Surgeon: Nada Libman, MD;  Location: [facility] CATH LAB;  Service: Cardiovascular;  Laterality: N/A;   THROMBECTOMY / ARTERIOVENOUS GRAFT REVISION  2011   left upper arm   TUBAL LIGATION  1980's   UPPER EXTREMITY ANGIOGRAPHY Bilateral 12/06/2016   Procedure: Upper Extremity Angiography;  Surgeon: Renford Dills, MD;  Location: [facility] INVASIVE CV LAB;  Service: Cardiovascular;  Laterality: Bilateral;   UPPER EXTREMITY INTERVENTION Left 06/06/2017   Procedure: UPPER  EXTREMITY INTERVENTION;  Surgeon: Renford Dills, MD;  Location: [facility] INVASIVE CV LAB;  Service: Cardiovascular;  Laterality: Left;   UPPER EXTREMITY VENOGRAPHY Left 10/04/2022   Procedure: UPPER EXTREMITY VENOGRAPHY;  Surgeon: Renford Dills, MD;  Location: [facility] INVASIVE CV LAB;  Service: Cardiovascular;  Laterality: Left;     A IV Location/Drains/Wounds Patient Lines/Drains/Airways Status     Active Line/Drains/Airways  Name Placement date Placement time Site Days   Peripheral IV 10/28/22 20 G 1" Anterior;Proximal;Right Forearm 10/28/22  1100  Forearm  1   Fistula / Graft Left Upper arm Arteriovenous fistula --  --  Upper arm  --   Hemodialysis Catheter Left Subclavian Double lumen Temporary (Non-Tunneled) 06/09/22  --  Subclavian  142   Wound / Incision (Open or Dehisced) 11/14/21 Arm Left;Posterior;Proximal;Upper left proximal axilla large blackhead 11/14/21  1736  Arm  349            Intake/Output Last 24 hours No intake or output data in the 24 hours ending 10/29/22 0129  Labs/Imaging Results for orders placed or performed during the hospital encounter of 10/28/22 (from the past 48 hour(s))  Basic metabolic panel     Status: Abnormal   Collection Time: 10/28/22  9:29 PM  Result Value Ref Range   Sodium 136 135 - 145 mmol/L   Potassium 3.5 3.5 - 5.1 mmol/L   Chloride 99 98 - 111 mmol/L   CO2 25 22 - 32 mmol/L   Glucose, Bld 136 (H) 70 - 99 mg/dL    Comment: Glucose reference range applies only to samples taken after fasting for at least 8 hours.   BUN 23 8 - 23 mg/dL   Creatinine, Ser 0.10 (H) 0.44 - 1.00 mg/dL   Calcium 8.1 (L) 8.9 - 10.3 mg/dL   GFR, Estimated 12 (L) >60 mL/min    Comment: (NOTE) Calculated using the CKD-EPI Creatinine Equation (2021)    Anion gap 12 5 - 15    Comment: Performed at [facility], [address] [place], [place] [address]  CBC with Differential     Status: Abnormal   Collection Time: 10/28/22  9:29 PM  Result Value Ref  Range   WBC 6.2 4.0 - 10.5 K/uL   RBC 1.83 (L) 3.87 - 5.11 MIL/uL   Hemoglobin 7.1 (L) 12.0 - 15.0 g/dL   HCT 66.4 (L) 40.3 - 47.4 %   MCV 120.8 (H) 80.0 - 100.0 fL   MCH 38.8 (H) 26.0 - 34.0 pg   MCHC 32.1 30.0 - 36.0 g/dL   RDW 25.9 (H) 56.3 - 87.5 %   Platelets 215 150 - 400 K/uL   nRBC 0.0 0.0 - 0.2 %   Neutrophils Relative % 74 %   Neutro Abs 4.6 1.7 - 7.7 K/uL   Lymphocytes Relative 10 %   Lymphs Abs 0.6 (L) 0.7 - 4.0 K/uL   Monocytes Relative 10 %   Monocytes Absolute 0.6 0.1 - 1.0 K/uL   Eosinophils Relative 3 %   Eosinophils Absolute 0.2 0.0 - 0.5 K/uL   Basophils Relative 2 %   Basophils Absolute 0.1 0.0 - 0.1 K/uL   Immature Granulocytes 1 %   Abs Immature Granulocytes 0.04 0.00 - 0.07 K/uL    Comment: Performed at [facility], [address] [place], [place] [address]  Type and screen [facility]     Status: None   Collection Time: 10/28/22 11:05 PM  Result Value Ref Range   ABO/RH(D) O POS    Antibody Screen NEG    Sample Expiration      10/31/2022,2359 Performed at [facility], [address], [place], [place] [address]    *Note: Due to a large number of results and/or encounters for the requested time period, some results have not been displayed. A complete set of results can be found in Results Review.   DG Chest Port 1  View  Result Date: 10/28/2022 CLINICAL DATA:  Cough EXAM: PORTABLE CHEST 1 VIEW COMPARISON:  10/23/2022 FINDINGS: Prior CABG. Left dialysis catheter remains in place with the tip in the right atrium. Mild cardiomegaly. No overt edema. Bibasilar atelectasis. No effusions or acute bony abnormality. IMPRESSION: Cardiomegaly, bibasilar atelectasis. Electronically Signed   By: Charlett Nose M.D.   On: 10/28/2022 22:15    Pending Labs Unresulted Labs (From admission, onward)     Start     Ordered   10/28/22 2224  Occult blood card to lab, stool RN will collect  Once,   URGENT       Question:  Specimen to be collected by:  Answer:  RN will  collect   10/28/22 2224            Vitals/Pain Today's Vitals   10/29/22 0026 10/29/22 0030 10/29/22 0040 10/29/22 0100  BP: (!) 194/65  (!) 174/60 (!) 148/43  Pulse:   (!) 56 (!) 49  Resp: Temp:   98.1 F (36.7 C)   TempSrc:      SpO2:   98% 97%  Weight:      Height:      PainSc:  0-No pain      Isolation Precautions No active isolations  Medications Medications  0.9 %  sodium chloride infusion (Manually program via Guardrails IV Fluids) ( Intravenous New Bag/Given 10/28/22 2328)  enalaprilat (VASOTEC) injection 0.625 mg (0.625 mg Intravenous Given 10/28/22 2324)  cloNIDine (CATAPRES) tablet 0.2 mg (0.2 mg Oral Given 10/28/22 2323)    Mobility walks with device     Focused Assessments Renal Assessment Handoff:  Hemodialysis Schedule: Hemodialysis Schedule: Monday/Wednesday/Friday Last Hemodialysis date and time: 10/28/22   Restricted appendage: left arm   R Recommendations: See Admitting Provider Note  Report given to:   Additional Notes: pt is alert and oriented x 4 said that she was weak after dialysis yesterday. Does have antibiodies and will be a while on the blood.

## 2022-10-29 NOTE — Progress Notes (Signed)
Patient admitted after midnight, please see H&P.  Here with symptomatic anemia.   -still awaiting PRBCs -difficult to stick for labs-- will try to get h/h after cbc -GI consulted -renal consult for routine HD  Shawna Canary DO

## 2022-10-29 NOTE — TOC Initial Note (Signed)
Transition of Care [facility]) - Initial/Assessment Note    Patient Details  Name: Shawna [name] MRN: [id number] Date of Birth: October 15, 1940  Transition of Care [facility]) CM/SW Contact:    Catalina Gravel, LCSW Phone Number: 10/29/2022, 4:10 PM  Clinical Narrative:                 .. Transition of Care [facility]) Screening Note   Patient Details  Name: Shawna [name] Date of Birth: [birth year]   Transition of Care [facility]) CM/SW Contact:    Catalina Gravel, LCSW Phone Number: 10/29/2022, 4:10 PM    Transition of Care Department [facility]) has reviewed patient and no TOC needs have been identified at this time. We will continue to monitor patient advancement through interdisciplinary progression rounds. If new patient transition needs arise, please place a TOC consult.      Barriers to Discharge: Continued Medical Work up   Patient Goals and CMS Choice            Expected Discharge Plan and Services                                              Prior Living Arrangements/Services                       Activities of Daily Living Home Assistive Devices/Equipment: Cane (specify quad or straight) ADL Screening (condition at time of admission) Patient's cognitive ability adequate to safely complete daily activities?: Yes Is the patient deaf or have difficulty hearing?: No Does the patient have difficulty seeing, even when wearing glasses/contacts?: No Does the patient have difficulty concentrating, remembering, or making decisions?: No Patient able to express need for assistance with ADLs?: Yes Does the patient have difficulty dressing or bathing?: No Independently performs ADLs?: Yes (appropriate for developmental age) Does the patient have difficulty walking or climbing stairs?: No Weakness of Legs: Left Weakness of Arms/Hands: None  Permission Sought/Granted                  Emotional Assessment              Admission diagnosis:  Severe hypertension  [I10] Symptomatic anemia [D64.9] Patient Active Problem List   Diagnosis Date Noted   Symptomatic anemia 10/29/2022   Anemia of chronic disease 10/29/2022   Iron deficiency anemia due to chronic blood loss 08/26/2022   Benign neoplasm of transverse colon 08/11/2022   Benign neoplasm of descending colon 08/11/2022   Benign neoplasm of sigmoid colon 08/11/2022   Angiodysplasia of duodenum 08/11/2022   Delirium 04/22/2022   History of CAD (coronary artery disease) 04/22/2022   NSTEMI, initial episode of care 10/19/2021   Acute on chronic blood loss anemia 09/09/2021   Ischemic cardiomyopathy    Atrial fibrillation, chronic 05/07/2021   Neuropathy 04/23/2021   Anemia associated with chronic renal failure 04/13/2021   Left knee pain 03/15/2021   Moderate protein-calorie malnutrition 02/27/2021   COVID-19 virus infection 02/03/2021   Leukocytosis 02/03/2021   Acquired hypothyroidism 02/03/2021   Myositis 12/03/2020   Ataxia 12/02/2020   Diabetes mellitus type 2 in nonobese 11/10/2020   Failure to thrive in adult 11/09/2020   Dialysis AV fistula malfunction, initial encounter    Jugular vein occlusion, right    Failure of surgically constructed arteriovenous fistula 10/03/2020   Myoclonus 08/31/2020   Clotted renal  dialysis AV graft, initial encounter    Hemodialysis-associated hypotension    Irritable bowel syndrome 02/25/2020   Adenomatous duodenal polyp 09/10/2019   History of GI bleed 09/10/2019   Hand steal syndrome 08/01/2017   CAD (coronary artery disease) 06/05/2017   Complication of vascular access for dialysis 03/19/2017   Preoperative clearance 01/25/2017   H/O non-ST elevation myocardial infarction (NSTEMI) 10/24/2016   Non-ST elevation (NSTEMI) myocardial infarction    Heme positive stool    Cardiac arrest    Palliative care encounter    Goals of care, counseling/discussion    Flash pulmonary edema 04/06/2016   History of colon cancer 01/27/2016   History of  ovarian cancer 01/27/2016   PAF (paroxysmal atrial fibrillation) 10/14/2015   Malignant neoplasm of right ovary 10/14/2015   SVT (supraventricular tachycardia) 09/08/2015   Essential hypertension    Dyspnea    Occlusion and stenosis of carotid artery without mention of cerebral infarction 01/24/2013   Hx of CABG 07/05/2012   Carotid artery disease 07/05/2012   Mitral regurgitation 06/12/2012   Non-STEMI (non-ST elevated myocardial infarction) (HCC) 06/08/2012   Chronic combined systolic and diastolic CHF (congestive heart failure) 05/02/2012   AVM (arteriovenous malformation) of small bowel, acquired 01/20/2012   GERD (gastroesophageal reflux disease) 01/09/2012   HLD (hyperlipidemia) 01/05/2012   Atherosclerotic heart disease of native coronary artery without angina pectoris 12/16/2011   Anxiety disorder 05/04/2011   End-stage renal disease on hemodialysis 04/29/2011   Gout 04/29/2011   PCP:  [facility]:   [facility] [address], [place] - [address] [place] [address] Phone: [phone] Fax: [phone]  [facility] (ESRD Bundle Only) - [place], [place] - [address] [place] [address] Phone: [phone] Fax: [phone]  [facility] - [address] [place], [place] - [address] [place] [address] Phone: [phone] Fax: [phone]     Social Determinants of Health (SDOH) Social History: SDOH Screenings   Food Insecurity: No Food Insecurity (10/29/2022)  Housing: Low Risk  (10/29/2022)  Transportation Needs: No Transportation Needs (10/29/2022)  Utilities: Not At Risk (10/29/2022)  Financial Resource Strain: Low Risk  (12/14/2018)  Physical Activity: Unknown (12/14/2018)  Social Connections: Unknown (12/14/2018)  Stress: No Stress Concern Present (12/14/2018)  Tobacco Use: Low Risk  (10/28/2022)   SDOH Interventions:     Readmission Risk Interventions    08/29/2022    4:47 PM  11/12/2021    8:56 AM 10/20/2021    4:38 PM  Readmission Risk Prevention Plan  Transportation Screening Complete Complete Complete  Medication Review [employer]) Complete Complete Complete  PCP or Specialist appointment within 3-5 days of discharge Complete Complete Complete  HRI or Home Care Consult Complete Complete Complete  SW Recovery Care/Counseling Consult Complete Complete Complete  Palliative Care Screening Not Applicable Not Applicable Not Applicable  Skilled Nursing Facility Complete Complete Not Applicable

## 2022-10-29 NOTE — Progress Notes (Signed)
Contacted lab to determine the status of blood product that was ordered for transfusion prior to transfer to room 341 (from ED). Lab communicates that patient has antibodies' may be 1.5 hours minimum before ready for pickup. Lab communicates that the staff RN will be called when the unit is ready.

## 2022-10-30 ENCOUNTER — Inpatient Hospital Stay (HOSPITAL_COMMUNITY): Payer: Medicare Other

## 2022-10-30 DIAGNOSIS — R569 Unspecified convulsions: Secondary | ICD-10-CM | POA: Diagnosis not present

## 2022-10-30 DIAGNOSIS — D649 Anemia, unspecified: Secondary | ICD-10-CM | POA: Diagnosis not present

## 2022-10-30 DIAGNOSIS — D509 Iron deficiency anemia, unspecified: Secondary | ICD-10-CM | POA: Diagnosis not present

## 2022-10-30 DIAGNOSIS — E039 Hypothyroidism, unspecified: Secondary | ICD-10-CM | POA: Diagnosis not present

## 2022-10-30 LAB — COMPREHENSIVE METABOLIC PANEL
ALT: 13 U/L (ref 0–44)
AST: 15 U/L (ref 15–41)
Albumin: 3.4 g/dL — ABNORMAL LOW (ref 3.5–5.0)
Alkaline Phosphatase: 103 U/L (ref 38–126)
Anion gap: 18 — ABNORMAL HIGH (ref 5–15)
BUN: 55 mg/dL — ABNORMAL HIGH (ref 8–23)
CO2: 15 mmol/L — ABNORMAL LOW (ref 22–32)
Calcium: 7.4 mg/dL — ABNORMAL LOW (ref 8.9–10.3)
Chloride: 100 mmol/L (ref 98–111)
Creatinine, Ser: 6.72 mg/dL — ABNORMAL HIGH (ref 0.44–1.00)
GFR, Estimated: 6 mL/min — ABNORMAL LOW (ref >60)
Glucose, Bld: 193 mg/dL — ABNORMAL HIGH (ref 70–99)
Potassium: 5 mmol/L (ref 3.5–5.1)
Sodium: 133 mmol/L — ABNORMAL LOW (ref 135–145)
Total Bilirubin: 1 mg/dL (ref 0.3–1.2)
Total Protein: 6.6 g/dL (ref 6.5–8.1)

## 2022-10-30 LAB — RENAL FUNCTION PANEL
Albumin: 3.3 g/dL — ABNORMAL LOW (ref 3.5–5.0)
Anion gap: 12 (ref 5–15)
BUN: 44 mg/dL — ABNORMAL HIGH (ref 8–23)
CO2: 25 mmol/L (ref 22–32)
Calcium: 7.4 mg/dL — ABNORMAL LOW (ref 8.9–10.3)
Chloride: 100 mmol/L (ref 98–111)
Creatinine, Ser: 5.88 mg/dL — ABNORMAL HIGH (ref 0.44–1.00)
GFR, Estimated: 7 mL/min — ABNORMAL LOW (ref >60)
Glucose, Bld: 89 mg/dL (ref 70–99)
Phosphorus: 5.6 mg/dL — ABNORMAL HIGH (ref 2.5–4.6)
Potassium: 5.1 mmol/L (ref 3.5–5.1)
Sodium: 137 mmol/L (ref 135–145)

## 2022-10-30 LAB — CBC
HCT: 32.5 % — ABNORMAL LOW (ref 36.0–46.0)
HCT: 34.4 % — ABNORMAL LOW (ref 36.0–46.0)
Hemoglobin: 10.6 g/dL — ABNORMAL LOW (ref 12.0–15.0)
Hemoglobin: 10.9 g/dL — ABNORMAL LOW (ref 12.0–15.0)
MCH: 34.5 pg — ABNORMAL HIGH (ref 26.0–34.0)
MCH: 34.9 pg — ABNORMAL HIGH (ref 26.0–34.0)
MCHC: 32.6 g/dL (ref 30.0–36.0)
MCHC: 31.7 g/dL (ref 30.0–36.0)
MCV: 105.9 fL — ABNORMAL HIGH (ref 80.0–100.0)
MCV: 110.3 fL — ABNORMAL HIGH (ref 80.0–100.0)
Platelets: 192 10*3/uL (ref 150–400)
Platelets: 188 10*3/uL (ref 150–400)
RBC: 3.07 MIL/uL — ABNORMAL LOW (ref 3.87–5.11)
RBC: 3.12 MIL/uL — ABNORMAL LOW (ref 3.87–5.11)
RDW: 27.6 % — ABNORMAL HIGH (ref 11.5–15.5)
RDW: 27.3 % — ABNORMAL HIGH (ref 11.5–15.5)
WBC: 6.2 10*3/uL (ref 4.0–10.5)
WBC: 8.4 10*3/uL (ref 4.0–10.5)
nRBC: 0 % (ref 0.0–0.2)
nRBC: 0 % (ref 0.0–0.2)

## 2022-10-30 LAB — GLUCOSE, CAPILLARY: Glucose-Capillary: 126 mg/dL — ABNORMAL HIGH (ref 70–99)

## 2022-10-30 LAB — TYPE AND SCREEN
ABO/RH(D): O POS
Antibody Screen: NEGATIVE
Donor AG Type: NEGATIVE

## 2022-10-30 LAB — BPAM RBC
ISSUE DATE / TIME: 202404201329
Unit Type and Rh: 5100

## 2022-10-30 LAB — HEPATITIS B SURFACE ANTIGEN: Hepatitis B Surface Ag: NONREACTIVE

## 2022-10-30 LAB — MRSA NEXT GEN BY PCR, NASAL: MRSA by PCR Next Gen: NOT DETECTED

## 2022-10-30 LAB — MAGNESIUM: Magnesium: 1.8 mg/dL (ref 1.7–2.4)

## 2022-10-30 MED ORDER — LISINOPRIL 20 MG PO TABS
20.0000 mg | ORAL_TABLET | Freq: Every day | ORAL | Status: DC
  Administered 2022-10-30 – 2022-11-13 (×14): 20 mg via ORAL
  Filled 2022-10-30: qty 2
  Filled 2022-10-30: qty 1
  Filled 2022-10-30 (×11): qty 2
  Filled 2022-10-30: qty 1

## 2022-10-30 MED ORDER — LIDOCAINE HCL (PF) 1 % IJ SOLN: 5.0000 mL | INTRAMUSCULAR | Status: DC | PRN

## 2022-10-30 MED ORDER — LORAZEPAM 2 MG/ML IJ SOLN
INTRAMUSCULAR | Status: AC
  Filled 2022-10-30: qty 1

## 2022-10-30 MED ORDER — HEPARIN SODIUM (PORCINE) 1000 UNIT/ML DIALYSIS
1000.0000 [IU] | INTRAMUSCULAR | Status: DC | PRN
  Administered 2022-10-31: 3800 [IU]
  Administered 2022-11-02: 1000 [IU]
  Administered 2022-11-04 – 2022-11-07 (×2): 3800 [IU]
  Administered 2022-11-09: 1000 [IU]
  Administered 2022-11-11: 3800 [IU]
  Filled 2022-10-30 (×2): qty 1

## 2022-10-30 MED ORDER — LORAZEPAM 2 MG/ML IJ SOLN
1.0000 mg | Freq: Once | INTRAMUSCULAR | Status: AC
  Administered 2022-10-30: 1 mg via INTRAVENOUS

## 2022-10-30 MED ORDER — LIDOCAINE-PRILOCAINE 2.5-2.5 % EX CREA: 1.0000 | TOPICAL_CREAM | CUTANEOUS | Status: DC | PRN

## 2022-10-30 MED ORDER — LEVETIRACETAM 500 MG PO TABS
500.0000 mg | ORAL_TABLET | Freq: Every day | ORAL | Status: DC
  Administered 2022-10-31 – 2022-11-15 (×16): 500 mg via ORAL
  Filled 2022-10-30 (×16): qty 1

## 2022-10-30 MED ORDER — ALTEPLASE 2 MG IJ SOLR: 2.0000 mg | Freq: Once | INTRAMUSCULAR | Status: DC | PRN

## 2022-10-30 MED ORDER — PENTAFLUOROPROP-TETRAFLUOROETH EX AERO: 1.0000 | INHALATION_SPRAY | CUTANEOUS | Status: DC | PRN

## 2022-10-30 MED ORDER — ANTICOAGULANT SODIUM CITRATE 4% (200MG/5ML) IV SOLN: 5.0000 mL | Status: DC | PRN

## 2022-10-30 MED ORDER — HYDRALAZINE HCL 20 MG/ML IJ SOLN
5.0000 mg | Freq: Four times a day (QID) | INTRAMUSCULAR | Status: DC | PRN
  Administered 2022-10-30 – 2022-10-31 (×2): 5 mg via INTRAVENOUS
  Filled 2022-10-30 (×2): qty 1

## 2022-10-30 MED ORDER — LEVETIRACETAM IN NACL 1500 MG/100ML IV SOLN
1500.0000 mg | Freq: Once | INTRAVENOUS | Status: AC
  Administered 2022-10-30: 1500 mg via INTRAVENOUS
  Filled 2022-10-30: qty 100

## 2022-10-30 MED ORDER — SODIUM CHLORIDE 0.9 % IV SOLN: INTRAVENOUS | Status: DC

## 2022-10-30 NOTE — Progress Notes (Signed)
PROGRESS NOTE    Shawna [name]  XBJ:[id number] DOB: 02/29/1941 DOA: 10/28/2022 [facility]CP: [facility]    Brief Narrative:   Shawna [name] is a 82 y.o. female with medical history significant of CAD s/p CABG, chronic systolic CHF, hypertension, hyperlipidemia, ESRD on HD (MWF) and afib on anticoagulation who presents to the emergency department due to weakness and not feeling well after dialysis.  She was recently seen in the ED due to dialysis and patient has had several visits to the ED within the last few months for various complaints. Patient was admitted from 2/16 to 2/19 due to acute on chronic blood loss anemia, symptomatic anemia and anemia of CKD.  Patient had EGD and colonoscopy-(08/10/2022), duodenal and colonic polyps were removed, duodenal AVM was ablated and 2 units of PRBC were given.  IV iron and Procrit also given at that time. She had an EGD (08/27/2022) during which  multiple gastric AVMs treated with APC, small hiatal hernia, no active or stigmata of bleeding. VCE placed during EGD and showed few small AVMs in the small bowel, likely jejunum.  2 small polyps in the distal small bowel previously noted.    Assessment and Plan:  Symptomatic anemia/Acute on chronic blood loss anemia Anemia of chronic disease -had good response to 1 unit PRBC -plan for EGD tomorrow after HD   ESRD on HD (MWF) -nephrology consult -plan for HD in AM prior to EGD   CAD -continue with metoprolol and Crestor -holding ASA   Essential hypertension Continue home meds   Atrial fibrillation Patient was not a candidate for anticoagulation Continue with amiodarone and metoprolol   Acquired hypothyroidism Continue with Synthroid   Diabetes mellitus Most recent A1c is 5.5 08/07/2022, at this point no indication for any medications      DVT prophylaxis: SCDs Start: 10/29/22 0707    Code Status: Full Code  Disposition Plan:  Level of care: Med-Surg Status is: Inpatient Remains  inpatient appropriate because: needs HD in AM and then EGD    Consultants:  GI renal   Subjective: No SOB, no CP Eating well  Objective: Vitals:   10/29/22 1458 10/29/22 1626 10/29/22 2108 10/30/22 0601  BP:  (!) 153/65 (!) 146/84 (!) 179/75  Pulse:   (!) 55 (!) 55  Resp:  Temp:  97.8 F (36.6 C) 97.8 F (36.6 C) 97.9 F (36.6 C)  TempSrc:  Oral Oral   SpO2: 97% 100% 100% 100%  Weight:      Height:        Intake/Output Summary (Last 24 hours) at 10/30/2022 1021 Last data filed at 10/29/2022 1626 Gross per 24 hour  Intake 315 ml  Output --  Net 315 ml   Filed Weights   10/28/22 2056 10/29/22 0213  Weight: 58 kg 56.2 kg    Examination:   General: Appearance:    elderly female in no acute distress     Lungs:      respirations unlabored  Heart:    Bradycardic.    MS:   All extremities are intact.    Neurologic:   Awake, alert       Data Reviewed: I have personally reviewed following labs and imaging studies  CBC: Recent Labs  Lab 10/23/22 1418 10/28/22 2129 10/29/22 1931 10/30/22 0546  WBC 6.8 6.2 5.6 6.2  NEUTROABS 5.4 4.6  --   --   HGB 8.5* 7.1* 9.9* 10.6*  HCT 26.8* 22.1* 28.9* 32.5*  MCV 120.2*  120.8* 102.5* 105.9*  PLT 309 215 188 192   Basic Metabolic Panel: Recent Labs  Lab 10/23/22 1418 10/28/22 2129 10/30/22 0546  NA 140 136 137  K 4.9 3.5 5.1  CL 101 99 100  CO2 23 25 25   GLUCOSE 108* 136* 89  BUN 44* 23 44*  CREATININE 6.19* 3.63* 5.88*  CALCIUM 9.4 8.1* 7.4*  PHOS  --   --  5.6*   GFR: Estimated Creatinine Clearance: 6.5 mL/min (A) (by C-G formula based on SCr of 5.88 mg/dL (H)). Liver Function Tests: Recent Labs  Lab 10/30/22 0546  ALBUMIN 3.3*   No results for input(s): "LIPASE", "AMYLASE" in the last 168 hours. No results for input(s): "AMMONIA" in the last 168 hours. Coagulation Profile: No results for input(s): "INR", "PROTIME" in the last 168 hours. Cardiac Enzymes: No results for input(s):  "CKTOTAL", "CKMB", "CKMBINDEX", "TROPONINI" in the last 168 hours. BNP (last 3 results) No results for input(s): "PROBNP" in the last 8760 hours. HbA1C: No results for input(s): "HGBA1C" in the last 72 hours. CBG: No results for input(s): "GLUCAP" in the last 168 hours. Lipid Profile: No results for input(s): "CHOL", "HDL", "LDLCALC", "TRIG", "CHOLHDL", "LDLDIRECT" in the last 72 hours. Thyroid Function Tests: No results for input(s): "TSH", "T4TOTAL", "FREET4", "T3FREE", "THYROIDAB" in the last 72 hours. Anemia Panel: No results for input(s): "VITAMINB12", "FOLATE", "FERRITIN", "TIBC", "IRON", "RETICCTPCT" in the last 72 hours. Sepsis Labs: No results for input(s): "PROCALCITON", "LATICACIDVEN" in the last 168 hours.  Recent Results (from the past 240 hour(s))  Resp panel by RT-PCR (RSV, Flu A&B, Covid) Anterior Nasal Swab     Status: None   Collection Time: 10/23/22 10:34 AM   Specimen: Anterior Nasal Swab  Result Value Ref Range Status   SARS Coronavirus 2 by RT PCR NEGATIVE NEGATIVE Final    Comment: (NOTE) SARS-CoV-2 target nucleic acids are NOT DETECTED.  The SARS-CoV-2 RNA is generally detectable in upper respiratory specimens during the acute phase of infection. The lowest concentration of SARS-CoV-2 viral copies this assay can detect is 138 copies/mL. A negative result does not preclude SARS-Cov-2 infection and should not be used as the sole basis for treatment or other patient management decisions. A negative result may occur with  improper specimen collection/handling, submission of specimen other than nasopharyngeal swab, presence of viral mutation(s) within the areas targeted by this assay, and inadequate number of viral copies(<138 copies/mL). A negative result must be combined with clinical observations, patient history, and epidemiological information. The expected result is Negative.  Fact Sheet for Patients:  BloggerCourse.com  Fact  Sheet for Healthcare Providers:  SeriousBroker.it  This test is no t yet approved or cleared by the [place] FDA and  has been authorized for detection and/or diagnosis of SARS-CoV-2 by FDA under an Emergency Use Authorization (EUA). This EUA will remain  in effect (meaning this test can be used) for the duration of the COVID-19 declaration under Section 564(b)(1) of the Act, 21 U.S.C.section 360bbb-3(b)(1), unless the authorization is terminated  or revoked sooner.       Influenza A by PCR NEGATIVE NEGATIVE Final   Influenza B by PCR NEGATIVE NEGATIVE Final    Comment: (NOTE) The Xpert Xpress SARS-CoV-2/FLU/RSV plus assay is intended as an aid in the diagnosis of influenza from Nasopharyngeal swab specimens and should not be used as a sole basis for treatment. Nasal washings and aspirates are unacceptable for Xpert Xpress SARS-CoV-2/FLU/RSV testing.  Fact Sheet for Patients: BloggerCourse.com  Fact Sheet for  Healthcare Providers: SeriousBroker.it  This test is not yet approved or cleared by the [place] and has been authorized for detection and/or diagnosis of SARS-CoV-2 by FDA under an Emergency Use Authorization (EUA). This EUA will remain in effect (meaning this test can be used) for the duration of the COVID-19 declaration under Section 564(b)(1) of the Act, 21 U.S.C. section 360bbb-3(b)(1), unless the authorization is terminated or revoked.     Resp Syncytial Virus by PCR NEGATIVE NEGATIVE Final    Comment: (NOTE) Fact Sheet for Patients: BloggerCourse.com  Fact Sheet for Healthcare Providers: SeriousBroker.it  This test is not yet approved or cleared by the [place] FDA and has been authorized for detection and/or diagnosis of SARS-CoV-2 by FDA under an Emergency Use Authorization (EUA). This EUA will remain in effect  (meaning this test can be used) for the duration of the COVID-19 declaration under Section 564(b)(1) of the Act, 21 U.S.C. section 360bbb-3(b)(1), unless the authorization is terminated or revoked.  Performed at [facility], [address] [place], [place] [address]          Radiology Studies: [facility] 1 View  Result Date: 10/28/2022 CLINICAL DATA:  Cough EXAM: PORTABLE CHEST 1 VIEW COMPARISON:  10/23/2022 FINDINGS: Prior CABG. Left dialysis catheter remains in place with the tip in the right atrium. Mild cardiomegaly. No overt edema. Bibasilar atelectasis. No effusions or acute bony abnormality. IMPRESSION: Cardiomegaly, bibasilar atelectasis. Electronically Signed   By: Charlett Nose M.D.   On: 10/28/2022 22:15        Scheduled Meds:  amiodarone  100 mg Oral Daily   [START ON 10/31/2022] calcitRIOL  1.75 mcg Oral Q M,W,F-HD   Chlorhexidine Gluconate Cloth  6 each Topical Daily   Chlorhexidine Gluconate Cloth  6 each Topical Q0600   [START ON 10/31/2022] cinacalcet  30 mg Oral Q M,W,F-HD   levothyroxine  75 mcg Oral QAC breakfast   lisinopril  20 mg Oral Daily   metoprolol tartrate  12.5 mg Oral BID   octreotide  100 mcg Subcutaneous Q12H   rosuvastatin  10 mg Oral Daily   Continuous Infusions:  anticoagulant sodium citrate       LOS: 1 day    Time spent: 45 minutes spent on chart review, discussion with nursing staff, consultants, updating family and interview/physical exam; more than 50% of that time was spent in counseling and/or coordination of care.    Joseph Art, DO [facility] Available via Epic secure chat 7am-7pm After these hours, please refer to coverage provider listed on [email or web address] 10/30/2022, 10:21 AM

## 2022-10-30 NOTE — Progress Notes (Addendum)
1805: nurse notified by NT that pt appeared to be having seizure like activity. Staff in to room to find pt with generalized shaking of body and drooling. Pt not responsive to painful stimuli or voice.  1808: Rapid response called. Generalized shaking lasted approx 3 minutes. Pt then lying with eyes open, continues to repeat, "Jesus, Hallelujah Jesus". Pupils 3, round and sluggish. CBG done, reads 126 mg/dl.  1810: B/P 130/66, HR 126 ST, resp 36/min, SaO2 99%. Pt does not respond to painful stimuli, does not make eye contact and still does not respond to painful stimuli, does not have protective reflexes with finger flick to eyes or resist gravity when arm held up, staring gaze only.  1812: MD Courage at bedside and appraised of events and current assessment.  1815: Repeat VS: B/P 202/102 (MAP130), HR 105 ST, RR 28, SaO2 99%. Pt still saying "Jesus, Do it Jesus!", and when MD Courage turned pt's head to him she stated, "Leave me alone!" But still does not follow commands or make eye contact. Pupils 3, round and sluggish. Skin warm and dry.  1820: B/P 202/102, MAP 143, HR 98 and SR, RR 24/min, SaO2 98%. MD Courage spoke with MD Benjamine Mola, decision made for head CT. Pt's husband to bedside, pt does not respond to husband.  1825: Pt transported to CT via sttretcher by nursing staff.

## 2022-10-30 NOTE — Progress Notes (Signed)
Shawna [name], M.D. Gastroenterology & Hepatology   Interval History:  No acute events overnight. Patient is a poor historian and does not remember our conversation yesterday.  Denies having any complaints such as nausea, vomiting, fever or chills. No reported melena or hematochezia per nursing staff. Received 1 unit of PRBC yesterday.  Repeat hemoglobin was 10.6 today in the morning.  Inpatient Medications:  Current Facility-Administered Medications:    ALPRAZolam (XANAX) tablet 0.5 mg, 0.5 mg, Oral, Daily PRN, Vann, Jessica U, DO   alteplase (CATHFLO ACTIVASE) injection 2 mg, 2 mg, Intracatheter, Once PRN, Anthony Sar, MD   amiodarone (PACERONE) tablet 100 mg, 100 mg, Oral, Daily, Adefeso, Oladapo, DO, 100 mg at 10/30/22 0820   anticoagulant sodium citrate solution 5 mL, 5 mL, Intracatheter, PRN, Anthony Sar, MD   Melene Muller ON 10/31/2022] calcitRIOL (ROCALTROL) capsule 1.75 mcg, 1.75 mcg, Oral, Q M,W,F-HD, Anthony Sar, MD   Chlorhexidine Gluconate Cloth 2 % PADS 6 each, 6 each, Topical, Daily, Marlin Canary U, DO, 6 each at 10/30/22 1308   Chlorhexidine Gluconate Cloth 2 % PADS 6 each, 6 each, Topical, Q0600, Anthony Sar, MD, 6 each at 10/30/22 0557   [START ON 10/31/2022] cinacalcet (SENSIPAR) tablet 30 mg, 30 mg, Oral, Q M,W,F-HD, Anthony Sar, MD   heparin injection 1,000 Units, 1,000 Units, Intracatheter, PRN, Anthony Sar, MD   levothyroxine (SYNTHROID) tablet 75 mcg, 75 mcg, Oral, QAC breakfast, Vann, Jessica U, DO, 75 mcg at 10/30/22 0554   lidocaine (PF) (XYLOCAINE) 1 % injection 5 mL, 5 mL, Intradermal, PRN, Anthony Sar, MD   lidocaine-prilocaine (EMLA) cream 1 Application, 1 Application, Topical, PRN, Anthony Sar, MD   lisinopril (ZESTRIL) tablet 20 mg, 20 mg, Oral, Daily, Vann, Jessica U, DO, 20 mg at 10/30/22 6578   metoprolol tartrate (LOPRESSOR) tablet 12.5 mg, 12.5 mg, Oral, BID, Vann, Jessica U, DO, 12.5 mg at 10/30/22 0820   octreotide (SANDOSTATIN) injection 100  mcg, 100 mcg, Subcutaneous, Q12H, Marguerita Merles, Amilliana Hayworth, MD, 100 mcg at 10/30/22 0945   ondansetron (ZOFRAN) tablet 4 mg, 4 mg, Oral, Q6H PRN **OR** ondansetron (ZOFRAN) injection 4 mg, 4 mg, Intravenous, Q6H PRN, Adefeso, Oladapo, DO, 4 mg at 10/29/22 1133   pentafluoroprop-tetrafluoroeth (GEBAUERS) aerosol 1 Application, 1 Application, Topical, PRN, Anthony Sar, MD   rosuvastatin (CRESTOR) tablet 10 mg, 10 mg, Oral, Daily, Adefeso, Oladapo, DO, 10 mg at 10/30/22 0820   I/O    Intake/Output Summary (Last 24 hours) at 10/30/2022 1029 Last data filed at 10/29/2022 1626 Gross per 24 hour  Intake 315 ml  Output --  Net 315 ml     Physical Exam: Temp:  [97.8 F (36.6 C)-98.2 F (36.8 C)] 97.9 F (36.6 C) (04/21 0601) Pulse Rate:  [55] 55 (04/21 0601) Resp:  [15-18] 18 (04/21 0601) BP: (144-179)/(54-84) 179/75 (04/21 0601) SpO2:  [97 %-100 %] 100 % (04/21 0601)  Temp (24hrs), Avg:98 F (36.7 C), Min:97.8 F (36.6 C), Max:98.2 F (36.8 C) GENERAL: The patient is AO x2, in no acute distress. HEENT: Head is normocephalic and atraumatic. EOMI are intact. Mouth is well hydrated and without lesions. NECK: Supple. No masses LUNGS: Clear to auscultation. No presence of rhonchi/wheezing/rales. Adequate chest expansion HEART: RRR, normal s1 and s2. ABDOMEN: Soft, nontender, no guarding, no peritoneal signs, and nondistended. BS +. No masses. EXTREMITIES: Without any cyanosis, clubbing, rash, lesions or edema. NEUROLOGIC: AOx2, no focal motor deficit. SKIN: no jaundice, no rashes  Laboratory Data: CBC:     Component Value Date/Time  WBC 6.2 10/30/2022 0546   RBC 3.07 (L) 10/30/2022 0546   HGB 10.6 (L) 10/30/2022 0546   HCT 32.5 (L) 10/30/2022 0546   PLT 192 10/30/2022 0546   MCV 105.9 (H) 10/30/2022 0546   MCH 34.5 (H) 10/30/2022 0546   MCHC 32.6 10/30/2022 0546   RDW 27.6 (H) 10/30/2022 0546   LYMPHSABS 0.6 (L) 10/28/2022 2129   MONOABS 0.6 10/28/2022 2129   EOSABS 0.2  10/28/2022 2129   BASOSABS 0.1 10/28/2022 2129   COAG:  Lab Results  Component Value Date   INR 1.3 (H) 11/08/2021   INR 1.0 02/03/2021   INR 1.1 06/12/2020    BMP:     Latest Ref Rng & Units 10/30/2022    5:46 AM 10/28/2022    9:29 PM 10/23/2022    2:18 PM  BMP  Glucose 70 - 99 mg/dL 89  536  644   BUN 8 - 23 mg/dL 44  23  44   Creatinine 0.44 - 1.00 mg/dL 0.34  7.42  5.95   Sodium 135 - 145 mmol/L 137  136  140   Potassium 3.5 - 5.1 mmol/L 5.1  3.5  4.9   Chloride 98 - 111 mmol/L 100  99  101   CO2 22 - 32 mmol/L Calcium 8.9 - 10.3 mg/dL 7.4  8.1  9.4     HEPATIC:     Latest Ref Rng & Units 10/30/2022    5:46 AM 08/29/2022    4:58 AM 08/26/2022    2:10 PM  Hepatic Function  Total Protein 6.5 - 8.1 g/dL   6.2   Albumin 3.5 - 5.0 g/dL 3.3  3.3  3.1   AST 15 - 41 U/L   26   ALT 0 - 44 U/L   17   Alk Phosphatase 38 - 126 U/L   85   Total Bilirubin 0.3 - 1.2 mg/dL   0.5     CARDIAC:  Lab Results  Component Value Date   CKTOTAL 47 12/03/2020   CKMB 1.5 05/19/2016   TROPONINI 0.37 (HH) 12/26/2018      Imaging: I personally reviewed and interpreted the available labs, imaging and endoscopic files.   Assessment/Plan: Shawna [name] is a 82 y.o. female with a history of dementia, ESRD on HD, Afib on ASA, CAD, congestive heart failure, small bowel AVMs, GERD, hypertension, colon cancer s/p resection in 1992,  duodenal adenoma, who came to the hospital after presenting weakness and fatigue after undergoing dialysis yesterday.  Gastroenterology was consulted for recurrent iron deficiency anemia.   Patient has presented recurrent iron deficiency anemia without overt testing of bleeding.  It is very likely she has presented recurrent chronic losses from the AVMs she has in her small bowel.  Would not rule out some intermittent losses from the small bowel polyps.  Patient responded to transfusion of 1 unit of PRBC yesterday.  We will possibly proceed with a push  enteroscopy on Monday after she undergoes her dialysis session -I discussed with hospitalist the possibility of arranging her dialysis session earlier in the morning.  Depending on the findings, may consider doing an outpatient colonoscopy for evaluation of the small bowel polyps ( given her comorbidities  and dementia, I will only favor this if her anemia does not improve).  Will continue on octreotide 100 mcg twice daily and she should be started on this  medication as outpatient to decrease the risk of intermittent  blood losses.   - Transfuse 1 UPRBC - Repeat CBC qday, transfuse if Hb <8 - 2 large bore IV lines - Active T/S - Avoid NSAIDs - Will proceed with push enteroscopy possibly on Monday after HD - Continue octreotide 100 mcg BID SQ   Shawna Blazing, MD Gastroenterology and Hepatology [facility] Gastroenterology

## 2022-10-30 NOTE — Progress Notes (Signed)
1807 Called to room by NT due to patient shaking. On arrival patient turned to side and O2 applied. Suction set up at bedside. Patient not arousable to painful stimuli. Pupils 2 round and sluggish. MD and rapid response called. Patient transported to CT and taken to room 205 after CT.

## 2022-10-30 NOTE — Progress Notes (Signed)
HD ordered for tomorrow, hopefully can be done in AM prior to push enteroscopy. Consult/will be seen tomorrow. Call with any questions/concerns.  Anthony Sar, MD [facility]

## 2022-10-30 NOTE — Progress Notes (Signed)
I responded to rapid Response - Staff reports change in mental status - Primary attending notified - Code stroke activated - CT head without contrast shows IMPRESSION: 1. No evidence of acute intracranial hemorrhage or acute territorial infarct. 2. Low-density subdural hygromas overlying both cerebral hemispheres measuring up to 6 mm on the left and 3 mm on the right, new since the ct head from 08/26/2022 but without evidence of acute blood within the collections.  - -Repeat CBC and CMP pending Teleneuro consult requested - Patient transferred to stepdown unit - Patient is calm and resting after IV Ativan - Await neuroinput to decide how aggressive to treat elevated BP - Patient's husband updated at bedside - Total critical care time 42 minutes  Shon Hale, MD

## 2022-10-30 NOTE — Progress Notes (Signed)
Teleneuro consult ordered by MD, cart taken to room by this RN. Report given to neuro RN as well as neurologist. Per day shift RN and patient's husband, patient had been in normal state until 1805 when floor NT noticed convulsions. See note from floor RN. Upon assessment in ICU, patient is responsive to painful stimuli and speech has become progressively clearer over last couple hours. Patient refusing to follow commands but will speak when probed and speech remains clear. Patient did squeeze this RNs fingers and grip was strong and equal to BUE, patient refusing further requests but is moving extremities spontaneously without difficulty. Pupils equal and reactive. Husband at bedside refused contrast for CTA d/t itching. See neurology note. Neurologist also aware of patient's increasing BP, allowing for permissive hypertension. Will continue to monitor patient and mental status but alertness does seem to be improving over last 2 hours.

## 2022-10-30 NOTE — Consult Note (Signed)
[facility] Telemedicine Consult   Requesting Provider: Jayme Cloud Consult Participants: Patient, bedside nurse, husband Location of the provider: [place],  Location of the patient: [place]  This consult was provided via telemedicine with 2-way video and audio communication. The patient/family was informed that care would be provided in this way and agreed to receive care in this manner.   Chief Complaint: Altered Mental Status  HPI: Shawna [name] is an 82 year old female who I have seen in the past for episodes of altered mental status and an episode concerning for myoclonus.  At that time, she was treated with Keppra.  She was in her normal state of health until around 6 PM at which point she had sudden onset altered mental status with bilateral convulsion.  Unfortunately I was unable to speak with the nurse so the episode, but apparently lasted approximately 3 minutes and she has been confused since that time.  She has had similar episodes in the past always associated with anemia, but currently her hemoglobin is stable at 10.9.   There has, however, been concern for GI bleeding with a hemoglobin decreased from 8.5 7 days ago to 7.1 2 days ago prior to transfusion.  Unfortunately, she does have a contrast allergy with diffuse itching. I recommended proceeding with CTA given her symptoms, but with her history fo severe itching, her husband did not want to proceed with this. I indicated that we may be missing something, and he indicated understanding.   LKW: 1800 tpa given?: No, concern for recent GI bleed IR Thrombectomy? No, low suspicion for LVO, family refuesd CTA  Exam: Vitals:   10/30/22 1845 10/30/22 1851  BP: (!) 209/63   Pulse:    Resp: (!) 27   Temp:  97.6 F (36.4 C)  SpO2:      General: in bed, asleep  1A: Level of Consciousness - 2 1B: Ask Month and Age - 2 1C: 'Blink Eyes' & 'Squeeze Hands' - 2 2: Test Horizontal Extraocular Movements - 0 3: Test  Visual Fields - 0, does not cooperate, keeps eyes tightly shut precluding bink to threat  4: Test Facial Palsy - 0 5A: Test Left Arm Motor Drift - 3 5B: Test Right Arm Motor Drift - 3 6A: Test Left Leg Motor Drift - 3 6B: Test Right Leg Motor Drift - 3 7: Test Limb Ataxia - 0 8: Test Sensation - 0, responds to stimulation bilaterally 9: Test Language/Aphasia- 2 10: Test Dysarthria - 0 11: Test Extinction/Inattention - 0 NIHSS score: 20   Imaging Reviewed: CT head - negative  Labs reviewed in epic and pertinent values follow: Na 133 BUN 55 Ca 7.4 with an albumin of 3.4   Assessment: 82 yo F with generalized shaking for approximately 3 minutes with abrupt change in mental status. At this point, I do think I would recommend anti-epileptic therapy and she has keppra 1.5 g ordered. I would continue at  QD with additional  after dialysis.   As long as she continues to improve and begins following commands, I thin kshe could be observed here with routine EEG< but is she remains densely confused with inability to follow commands or answer questions, then may need to consider transfer to [facility] for more urgent EEG.   Recommendations:  1) LEV  x 1 followed by  qd and additional  after dialysis.  2) EEG 3) would check in a couple of hours, and if still not following commands or answering questions, consider transfer for more  urgent EEG.    Ritta Slot, MD [facility] [phone]  If 7pm- 7am, please page neurology on call as listed in AMION.

## 2022-10-30 NOTE — Progress Notes (Signed)
1825 call time 1834 exam started 1835 exam finished 1835 images sent to soc 1838 exam completed in epic 1837 [facility] radiology called

## 2022-10-30 NOTE — TOC Progression Note (Signed)
Transition of Care [facility]) - Progression Note    Patient Details  Name: Shawna [name] MRN: [id number] Date of Birth: 06-16-1941  Transition of Care [facility]) CM/SW Contact  Catalina Gravel, [place] Phone Number: 10/30/2022, 3:05 PM  Clinical Narrative:     Pt needs additional medical evaluation.  Dialysis Monday and EDG. DC 2-3 days.    Barriers to Discharge: Continued Medical Work up  Expected Discharge Plan and Services                                               Social Determinants of Health (SDOH) Interventions SDOH Screenings   Food Insecurity: No Food Insecurity (10/29/2022)  Housing: Low Risk  (10/29/2022)  Transportation Needs: No Transportation Needs (10/29/2022)  Utilities: Not At Risk (10/29/2022)  Financial Resource Strain: Low Risk  (12/14/2018)  Physical Activity: Unknown (12/14/2018)  Social Connections: Unknown (12/14/2018)  Stress: No Stress Concern Present (12/14/2018)  Tobacco Use: Low Risk  (10/28/2022)    Readmission Risk Interventions    08/29/2022    4:47 PM 11/12/2021    8:56 AM 10/20/2021    4:38 PM  Readmission Risk Prevention Plan  Transportation Screening Complete Complete Complete  Medication Review Oceanographer) Complete Complete Complete  PCP or Specialist appointment within 3-5 days of discharge Complete Complete Complete  HRI or Home Care Consult Complete Complete Complete  SW Recovery Care/Counseling Consult Complete Complete Complete  Palliative Care Screening Not Applicable Not Applicable Not Applicable  Skilled Nursing Facility Complete Complete Not Applicable

## 2022-10-30 NOTE — Consult Note (Addendum)
1917 Elert- Pt with sz like activity noted at 1805 while she was on the floor. Pt just admitted into ICU when cart was activated. Husband at bedside says patient has some baseline confusion. Patient post ictal appearing. TSRN noted NCCT code stroke order was placed. Pt returned from CT prior to Code stroke activation. Determined this is a code stroke call instead of consult. 92 Page sent to telespecialist 1933 Page sent to [facility] provider [phone] Page canceled with telespecialist due to it being before 8pm per [facility] protocol 1935 Telespecialist and [facility] provider Amada Jupiter on screen. Telespecialist informed that stroke alert for them was canceled. Reason given: before 8pm- in our protocol to page [facility] provider [phone] Kirkpatric on with patient. Report given by bedside nurse 1938 NCCT results given to Kirkpatric on camera by TSRN 1943 Pt with anemia and hx of gi bleed, not tnk candidate 1953 Dr Thomes Dinning contact information [phone] given to Telespecialist on camera for f/u   NIH20, MRS 4, LKW 1800

## 2022-10-31 ENCOUNTER — Encounter (HOSPITAL_COMMUNITY): Payer: Self-pay | Admitting: Anesthesiology

## 2022-10-31 ENCOUNTER — Inpatient Hospital Stay (HOSPITAL_COMMUNITY): Payer: Medicare Other

## 2022-10-31 ENCOUNTER — Encounter (HOSPITAL_COMMUNITY)
Admission: EM | Disposition: A | Discharge status: Home Health | Payer: Self-pay | Source: Home / Self Care | Attending: Family Medicine

## 2022-10-31 ENCOUNTER — Inpatient Hospital Stay (HOSPITAL_COMMUNITY)
Admit: 2022-10-31 | Discharge: 2022-10-31 | Disposition: A | Discharge status: Home / Self Care | Payer: Medicare Other | Attending: Internal Medicine | Admitting: Internal Medicine

## 2022-10-31 DIAGNOSIS — N186 End stage renal disease: Secondary | ICD-10-CM | POA: Diagnosis not present

## 2022-10-31 DIAGNOSIS — K31811 Angiodysplasia of stomach and duodenum with bleeding: Secondary | ICD-10-CM | POA: Diagnosis not present

## 2022-10-31 DIAGNOSIS — R569 Unspecified convulsions: Secondary | ICD-10-CM | POA: Diagnosis not present

## 2022-10-31 DIAGNOSIS — D62 Acute posthemorrhagic anemia: Secondary | ICD-10-CM | POA: Diagnosis not present

## 2022-10-31 DIAGNOSIS — D649 Anemia, unspecified: Secondary | ICD-10-CM | POA: Diagnosis not present

## 2022-10-31 DIAGNOSIS — I10 Essential (primary) hypertension: Secondary | ICD-10-CM | POA: Diagnosis not present

## 2022-10-31 LAB — BASIC METABOLIC PANEL
Anion gap: 14 (ref 5–15)
BUN: 61 mg/dL — ABNORMAL HIGH (ref 8–23)
CO2: 19 mmol/L — ABNORMAL LOW (ref 22–32)
Calcium: 7.5 mg/dL — ABNORMAL LOW (ref 8.9–10.3)
Chloride: 102 mmol/L (ref 98–111)
Creatinine, Ser: 7.24 mg/dL — ABNORMAL HIGH (ref 0.44–1.00)
GFR, Estimated: 5 mL/min — ABNORMAL LOW (ref >60)
Glucose, Bld: 130 mg/dL — ABNORMAL HIGH (ref 70–99)
Potassium: 4.6 mmol/L (ref 3.5–5.1)
Sodium: 135 mmol/L (ref 135–145)

## 2022-10-31 LAB — CBC
HCT: 30.8 % — ABNORMAL LOW (ref 36.0–46.0)
Hemoglobin: 10.1 g/dL — ABNORMAL LOW (ref 12.0–15.0)
MCH: 34.2 pg — ABNORMAL HIGH (ref 26.0–34.0)
MCHC: 32.8 g/dL (ref 30.0–36.0)
MCV: 104.4 fL — ABNORMAL HIGH (ref 80.0–100.0)
Platelets: 191 10*3/uL (ref 150–400)
RBC: 2.95 MIL/uL — ABNORMAL LOW (ref 3.87–5.11)
RDW: 26.1 % — ABNORMAL HIGH (ref 11.5–15.5)
WBC: 8.1 10*3/uL (ref 4.0–10.5)
nRBC: 0 % (ref 0.0–0.2)

## 2022-10-31 LAB — OCCULT BLOOD X 1 CARD TO LAB, STOOL: Fecal Occult Bld: POSITIVE — AB

## 2022-10-31 SURGERY — ENTEROSCOPY
Anesthesia: Monitor Anesthesia Care

## 2022-10-31 MED ORDER — NIFEDIPINE ER OSMOTIC RELEASE 30 MG PO TB24
60.0000 mg | ORAL_TABLET | Freq: Every day | ORAL | Status: DC
  Administered 2022-10-31: 60 mg via ORAL
  Filled 2022-10-31: qty 2

## 2022-10-31 MED ORDER — GUAIFENESIN-DM 100-10 MG/5ML PO SYRP
5.0000 mL | ORAL_SOLUTION | ORAL | Status: DC | PRN
  Administered 2022-11-06 – 2022-11-13 (×8): 5 mL via ORAL
  Filled 2022-10-31 (×10): qty 5

## 2022-10-31 MED ORDER — CLONIDINE HCL 0.1 MG PO TABS
0.1000 mg | ORAL_TABLET | Freq: Two times a day (BID) | ORAL | Status: DC
  Administered 2022-10-31: 0.1 mg via ORAL
  Filled 2022-10-31: qty 1

## 2022-10-31 MED ORDER — HYDRALAZINE HCL 20 MG/ML IJ SOLN
10.0000 mg | Freq: Four times a day (QID) | INTRAMUSCULAR | Status: DC | PRN
  Administered 2022-10-31 – 2022-11-06 (×6): 10 mg via INTRAVENOUS
  Filled 2022-10-31 (×7): qty 1

## 2022-10-31 NOTE — Progress Notes (Signed)
PRN hydralazine given at 1649 per Dr Tat orders for blood pressure 211/65. Decreased in blood pressure noted but still SBP remains >170. Patient denies any chest pain or discomfort. Dr Tat made aware of medication given and current blood pressure. New orders placed by Dr Tat. Patient had large, formed brown stool this afternoon. Occult stool obtained and result positive. Dr Tat made aware.

## 2022-10-31 NOTE — Procedures (Signed)
Patient Name: PRIM MORACE  MRN: [id number]  Epilepsy Attending: Charlsie Quest  Referring Physician/Provider: Rejeana Brock, MD  Date:10/31/2022 Duration: 22.39 mins  Patient history: 82yo F getting eeg to evaluate for seizure  Level of alertness: Awake, asleep  AEDs during EEG study: LEV  Technical aspects: This EEG study was done with scalp electrodes positioned according to the 10-20 International system of electrode placement. Electrical activity was reviewed with band pass filter of 1-70Hz , sensitivity of 7 uV/mm, display speed of 54mm/sec with a  notched filter applied as appropriate. EEG data were recorded continuously and digitally stored.  Video monitoring was available and reviewed as appropriate.  Description: The posterior dominant rhythm consists of 8-9 Hz activity of moderate voltage (25-35 uV) seen predominantly in posterior head regions, symmetric and reactive to eye opening and eye closing. Sleep was characterized by vertex waves, sleep spindles (12 to 14 Hz), maximal frontocentral region.  EEG showed intermittent generalized 3 to 6 Hz theta-delta slowing. Hyperventilation and photic stimulation were not performed.     ABNORMALITY - Intermittent slow, generalized  IMPRESSION: This study is suggestive of mild diffuse encephalopathy. No seizures or epileptiform discharges were seen throughout the recording.  Noam Franzen Annabelle Harman

## 2022-10-31 NOTE — TOC Initial Note (Signed)
Transition of Care [facility]) - Initial/Assessment Note    Patient Details  Name: Shawna [name] MRN: [id number] Date of Birth: 1941-05-08  Transition of Care [facility]) CM/SW Contact:    Karn Cassis, LCSW Phone Number: 10/31/2022, 12:45 PM  Clinical Narrative:  Pt admitted for symptomatic anemia. Assessment completed due to high risk readmission score. LCSW completed assessment with pt's daughter as pt oriented x2 per chart. Pt's daughter reports pt lives with her husband and he provides assist with ADLs as needed. Pt ambulates with a cane at baseline. Her dialysis is MWF at [facility] in [place]. Pt plans to return home when medically stable. Daughter asks about home health and said that it was supposed to be set up after last hospital stay, but never came out. Per Maralyn Sago with [employer], it was referred to [facility] but they were unable to accept due to non-compliance in the past with them. Per home health, husband was notified. Pt's daughter was not aware. LCSW called [facility] to see if they would reconsider. They are looking at chart and will follow up with TOC.                  Expected Discharge Plan: Home/Self Care Barriers to Discharge: Continued Medical Work up   Patient Goals and CMS Choice Patient states their goals for this hospitalization and ongoing recovery are:: return home   Choice offered to / list presented to : Adult Children [place] ownership interest in [facility].provided to::  (n/a)    Expected Discharge Plan and Services In-house Referral: Clinical Social Work     Living arrangements for the past 2 months: Single Family Home                                      Prior Living Arrangements/Services Living arrangements for the past 2 months: Single Family Home Lives with:: Spouse Patient language and need for interpreter reviewed:: Yes Do you feel safe going back to the place where you live?: Yes      Need for Family Participation in  Patient Care: Yes (Comment) Care giver support system in place?: Yes (comment) Current home services: DME (wheelchair, walker, rollator, cane) Criminal Activity/Legal Involvement Pertinent to Current Situation/Hospitalization: No - Comment as needed  Activities of Daily Living Home Assistive Devices/Equipment: Cane (specify quad or straight) ADL Screening (condition at time of admission) Patient's cognitive ability adequate to safely complete daily activities?: Yes Is the patient deaf or have difficulty hearing?: No Does the patient have difficulty seeing, even when wearing glasses/contacts?: No Does the patient have difficulty concentrating, remembering, or making decisions?: No Patient able to express need for assistance with ADLs?: Yes Does the patient have difficulty dressing or bathing?: No Independently performs ADLs?: Yes (appropriate for developmental age) Does the patient have difficulty walking or climbing stairs?: No Weakness of Legs: Left Weakness of Arms/Hands: None  Permission Sought/Granted                  Emotional Assessment       Orientation: : Oriented to Place, Oriented to Self Alcohol / Substance Use: Not Applicable Psych Involvement: No (comment)  Admission diagnosis:  Severe hypertension [I10] Symptomatic anemia [D64.9] GI bleed [K92.2] Patient Active Problem List   Diagnosis Date Noted   Symptomatic anemia 10/29/2022   Anemia of chronic disease 10/29/2022   GI bleed 10/29/2022   Iron deficiency anemia due  to chronic blood loss 08/26/2022   Benign neoplasm of transverse colon 08/11/2022   Benign neoplasm of descending colon 08/11/2022   Benign neoplasm of sigmoid colon 08/11/2022   Angiodysplasia of duodenum 08/11/2022   Delirium 04/22/2022   History of CAD (coronary artery disease) 04/22/2022   Acute metabolic encephalopathy 11/09/2021   NSTEMI, initial episode of care 10/19/2021   Acute on chronic blood loss anemia 09/09/2021   Ischemic  cardiomyopathy    Atrial fibrillation, chronic 05/07/2021   Neuropathy 04/23/2021   Anemia associated with chronic renal failure 04/13/2021   Left knee pain 03/15/2021   Moderate protein-calorie malnutrition 02/27/2021   COVID-19 virus infection 02/03/2021   Leukocytosis 02/03/2021   Acquired hypothyroidism 02/03/2021   Myositis 12/03/2020   Ataxia 12/02/2020   Diabetes mellitus type 2 in nonobese 11/10/2020   Failure to thrive in adult 11/09/2020   Dialysis AV fistula malfunction, initial encounter    Jugular vein occlusion, right    Failure of surgically constructed arteriovenous fistula 10/03/2020   Myoclonus 08/31/2020   Clotted renal dialysis AV graft, initial encounter    Hemodialysis-associated hypotension    Irritable bowel syndrome 02/25/2020   Adenomatous duodenal polyp 09/10/2019   History of GI bleed 09/10/2019   Hand steal syndrome 08/01/2017   CAD (coronary artery disease) 06/05/2017   Complication of vascular access for dialysis 03/19/2017   Preoperative clearance 01/25/2017   H/O non-ST elevation myocardial infarction (NSTEMI) 10/24/2016   Non-ST elevation (NSTEMI) myocardial infarction    Heme positive stool    Cardiac arrest    Palliative care encounter    Goals of care, counseling/discussion    Flash pulmonary edema 04/06/2016   History of colon cancer 01/27/2016   History of ovarian cancer 01/27/2016   PAF (paroxysmal atrial fibrillation) 10/14/2015   Malignant neoplasm of right ovary 10/14/2015   SVT (supraventricular tachycardia) 09/08/2015   Essential hypertension    Dyspnea    Occlusion and stenosis of carotid artery without mention of cerebral infarction 01/24/2013   Hx of CABG 07/05/2012   Carotid artery disease 07/05/2012   Mitral regurgitation 06/12/2012   Non-STEMI (non-ST elevated myocardial infarction) (HCC) 06/08/2012   Chronic combined systolic and diastolic CHF (congestive heart failure) 05/02/2012   AVM (arteriovenous malformation) of  small bowel, acquired 01/20/2012   GERD (gastroesophageal reflux disease) 01/09/2012   HLD (hyperlipidemia) 01/05/2012   Atherosclerotic heart disease of native coronary artery without angina pectoris 12/16/2011   Anxiety disorder 05/04/2011   End-stage renal disease on hemodialysis 04/29/2011   Gout 04/29/2011   PCP:  Practice, [facility]:   [facility] [address], [place] - [address] [place] [address] Phone: [phone] Fax: [phone]  [facility] (ESRD Bundle Only) - [place], [place] - [address] [place] [address] Phone: [phone] Fax: [phone]  [facility] - [address] [place], [place] - [address] [place] [address] Phone: [phone] Fax: [phone]     Social Determinants of Health (SDOH) Social History: SDOH Screenings   Food Insecurity: No Food Insecurity (10/29/2022)  Housing: Low Risk  (10/29/2022)  Transportation Needs: No Transportation Needs (10/29/2022)  Utilities: Not At Risk (10/29/2022)  Financial Resource Strain: Low Risk  (12/14/2018)  Physical Activity: Unknown (12/14/2018)  Social Connections: Unknown (12/14/2018)  Stress: No Stress Concern Present (12/14/2018)  Tobacco Use: Low Risk  (10/28/2022)   SDOH Interventions:     Readmission Risk Interventions    10/31/2022  12:45 PM 08/29/2022    4:47 PM 11/12/2021    8:56 AM  Readmission Risk Prevention Plan  Transportation Screening Complete Complete Complete  Medication Review Oceanographer) Complete Complete Complete  PCP or Specialist appointment within 3-5 days of discharge  Complete Complete  HRI or Home Care Consult Complete Complete Complete  SW Recovery Care/Counseling Consult Complete Complete Complete  Palliative Care Screening Not Applicable Not Applicable Not Applicable  Skilled Nursing Facility Not Applicable Complete Complete

## 2022-10-31 NOTE — Progress Notes (Signed)
  HEMODIALYSIS TREATMENT NOTE:  Pre-HD 189/57 and SB 58.  A/Ox3.  "I need my dialysis.  I haven't had it since Friday."  [facility] exit site is unremarkable.  HD was initiated with a goal of 2L.  25 minutes into treatment HR^ to 115, sustained.  Asymptomatic. EKG was obtained; reviewed by Dr. Arbutus Leas.  Scheduled amiodarone and metoprolol were ordered/given.   Goal was lowered to 1.7 as BP began down-trending.  Tachycardia persisted 115-126.   HD was stopped 6 minutes early when pt began retching / dry-heaving, so as to give fluid bolus (with blood return).  Primary nurse will give Zofran.  Post-dialysis:  10/31/22 1332  Vital Signs  Temp 97.8 F (36.6 C)  Temp Source Oral  Pulse Rate (!) 116  Pulse Rate Source Monitor  Resp 11  BP (!) 159/68  BP Location Right Arm  BP Method Automatic  Patient Position (if appropriate) Lying  Oxygen Therapy  SpO2 99 %  O2 Device Room Air  Pain Assessment  Pain Score 0  Dialysis Weight  Weight 54.2 kg  Type of Weight Post-Dialysis  Post Treatment  Dialyzer Clearance Lightly streaked  Duration of HD Treatment -hour(s) 3.29 hour(s)  Hemodialysis Intake (mL) 0 mL  Liters Processed 73.2  Fluid Removed (mL) 1700 mL  Tolerated HD Treatment Yes  Post-Hemodialysis Comments Goal met  Education / Care Plan  Dialysis Education Provided Yes  Documented Education in Care Plan Yes  Outpatient Plan of Care Reviewed and on Chart Yes    Arman Filter, RN AP KDU

## 2022-10-31 NOTE — Care Plan (Signed)
Dr. Amada Jupiter requested follow-up on this patient.  On chart check, patient is improving.  I also communicated with Dr. Arbutus Leas who agrees that patient is improving and does not need any further neuro input.  Please do not hesitate to reach out to neurology again if needed.  Lasaundra Riche Annabelle Harman

## 2022-10-31 NOTE — Consult Note (Signed)
[name] Admit Date: 10/28/2022 10/31/2022 Shawna [name] Requesting Physician:  Tat MD  Reason for Consult:  ESRD comanagement, anemia, AMS  HPI:  [id number] presented to the ED and admitted 4/19 after complaining of fatigue and found to have recurrent anemia with a hemoglobin of 7.1.  PMH includes ESRD MWF via left IJ TDC at [facility]; CAD with history of CABG; history of CHF; hypertension; hyperlipidemia; paroxysmal atrial fibrillation on anticoagulation; hyperlipidemia; history of recurrent GI bleeds with gastric and small bowel AVMs noted on previous imaging.  After admission on 4/21 patient had rapid response for encephalopathy and unresponsive state.  CT of the brain was negative for hemorrhage.  Concern for seizure and loaded on Keppra.  Telemetry neurology following.  Encephalopathy improved some.  This morning she is alert to self but not location or year.  She has no specific complaints or concerns.  She was transfused at presentation and hemoglobin has remained stable, 10.1 this morning.  Plan is for EGD/enteroscopy later today.  I/Os: I/O last 3 completed shifts: In: 240 [P.O.:240] Out: -    ROS Balance of 12 systems is negative w/ exceptions as above  PMH  Past Medical History:  Diagnosis Date   Acute on chronic respiratory failure with hypoxia 10/10/2016   Anxiety    Arthritis    AVM (arteriovenous malformation) of colon    CAD (coronary artery disease)    a. s/p CABG in 2013 b. DES to D1 in 10/2016. c. cath in 07/2018 showing patent grafts with occlusion of D1 at prior stent site and progression of PDA disease --> medical management recommended   Carotid artery disease    a. 60-79% LICA, 03/2012    Chronic bronchitis    Chronic HFrEF (heart failure with reduced ejection fraction)    Colon cancer 1992   Esophageal stricture    ESRD on hemodialysis    ESRD due to HTN, started dialysis 2011 and gets HD at [facility] with Dr Fausto Skillern on MWF schedule.  Access is LUA  AVF as of Sept 2014.    GERD (gastroesophageal reflux disease)    High cholesterol 12/2011   History of blood transfusion 07/2011; 12/2011; 01/2012 X 2; 04/2012   History of gout    History of lower GI bleeding    Hypertension    Iron deficiency anemia    Jugular vein occlusion, right    Mitral regurgitation    a. Moderate by echo, 02/2012   Mitral valve disease    NSVT (nonsustained ventricular tachycardia)    Ovarian cancer 1992   PAF (paroxysmal atrial fibrillation)    Pneumonia ~ 2009   PUD (peptic ulcer disease)    TIA (transient ischemic attack)    Tricuspid valve disease    PSH  Past Surgical History:  Procedure Laterality Date   A/V FISTULAGRAM Left 10/04/2022   Procedure: A/V Fistulagram;  Surgeon: Renford Dills, MD;  Location: [facility] INVASIVE CV LAB;  Service: Cardiovascular;  Laterality: Left;   A/V SHUNTOGRAM Left 03/19/2019   Procedure: A/V SHUNTOGRAM;  Surgeon: Renford Dills, MD;  Location: [facility] INVASIVE CV LAB;  Service: Cardiovascular;  Laterality: Left;   ABDOMINAL HYSTERECTOMY  1992   APPENDECTOMY  06/1990   AV FISTULA PLACEMENT  07/2009   left upper arm   AV FISTULA PLACEMENT Right 09/06/2016   Procedure: RIGHT FOREARM ARTERIOVENOUS (AV) GRAFT;  Surgeon: Sherren Kerns, MD;  Location: [facility] OR;  Service: Vascular;  Laterality: Right;   AV FISTULA  PLACEMENT N/A 02/24/2017   Procedure: INSERTION OF ARTERIOVENOUS (AV) GORE-TEX GRAFT ARM (BRACHIAL AXILLARY);  Surgeon: Renford Dills, MD;  Location: [facility] ORS;  Service: Vascular;  Laterality: N/A;   AVGG REMOVAL Right 09/06/2016   Procedure: REMOVAL OF Right Arm ARTERIOVENOUS GORETEX GRAFT and Vein Patch angioplasty of brachial artery;  Surgeon: Chuck Hint, MD;  Location: [facility] OR;  Service: Vascular;  Laterality: Right;   BIOPSY  09/26/2019   Procedure: BIOPSY;  Surgeon: Malissa Hippo, MD;  [facility];  Service: Endoscopy;;   COLON RESECTION  1992   COLON SURGERY     COLONOSCOPY N/A  03/09/2019   Procedure: COLONOSCOPY;  Surgeon: Malissa Hippo, MD;  [facility];  Service: Endoscopy;  Laterality: N/A;   COLONOSCOPY N/A 08/11/2022   Procedure: COLONOSCOPY;  Surgeon: Beverley Fiedler, MD;  Location: [facility] ENDOSCOPY;  Service: Gastroenterology;  Laterality: N/A;   COLONOSCOPY WITH PROPOFOL N/A 06/08/2021   Procedure: COLONOSCOPY WITH PROPOFOL;  Surgeon: Dolores Frame, MD;  [facility];  Service: Gastroenterology;  Laterality: N/A;  9:05 /Patient is on dialysis Mon Wed Fri   CORONARY ANGIOPLASTY WITH STENT PLACEMENT  12/15/11   "2"   CORONARY ANGIOPLASTY WITH STENT PLACEMENT  y/2013   "1; makes total of 3" (05/02/2012)   CORONARY ARTERY BYPASS GRAFT  06/13/2012   Procedure: CORONARY ARTERY BYPASS GRAFTING (CABG);  Surgeon: Delight Ovens, MD;  Location: [facility] OR;  Service: Open Heart Surgery;  Laterality: N/A;  cabg x four;  using left internal mammary artery, and left leg greater saphenous vein harvested endoscopically   CORONARY STENT INTERVENTION N/A 10/13/2016   Procedure: Coronary Stent Intervention;  Surgeon: Lennette Bihari, MD;  [facility] INV[facility] CV LAB;  Service: Cardiovascular;  Laterality: N/A;   DIALYSIS/PERMA CATHETER REMOVAL N/A 04/18/2017   Procedure: DIALYSIS/PERMA CATHETER REMOVAL;  Surgeon: Renford Dills, MD;  Location: [facility] INVASIVE CV LAB;  Service: Cardiovascular;  Laterality: N/A;   DILATION AND CURETTAGE OF UTERUS     ENTEROSCOPY N/A 06/08/2021   Procedure: PUSH ENTEROSCOPY;  Surgeon: Dolores Frame, MD;  [facility];  Service: Gastroenterology;  Laterality: N/A;   ESOPHAGOGASTRODUODENOSCOPY  01/20/2012   Procedure: ESOPHAGOGASTRODUODENOSCOPY (EGD);  Surgeon: Meryl Dare, MD,FACG;  Location: [facility] ENDOSCOPY;  Service: Endoscopy;  Laterality: N/A;   ESOPHAGOGASTRODUODENOSCOPY N/A 03/26/2013   Procedure: ESOPHAGOGASTRODUODENOSCOPY (EGD);  Surgeon: Hilarie Fredrickson, MD;  Location: [facility] ENDOSCOPY;  Service:  Endoscopy;  Laterality: N/A;   ESOPHAGOGASTRODUODENOSCOPY N/A 04/30/2015   Procedure: ESOPHAGOGASTRODUODENOSCOPY (EGD);  Surgeon: Malissa Hippo, MD;  [facility];  Service: Endoscopy;  Laterality: N/A;  1pm - moved to 10/20 @ 1:10   ESOPHAGOGASTRODUODENOSCOPY N/A 07/29/2016   Procedure: ESOPHAGOGASTRODUODENOSCOPY (EGD);  Surgeon: Ruffin Frederick, MD;  Location: [facility] ENDOSCOPY;  Service: Gastroenterology;  Laterality: N/A;  enteroscopy   ESOPHAGOGASTRODUODENOSCOPY N/A 09/26/2019   Procedure: ESOPHAGOGASTRODUODENOSCOPY (EGD);  Surgeon: Malissa Hippo, MD;  [facility];  Service: Endoscopy;  Laterality: N/A;  1250   ESOPHAGOGASTRODUODENOSCOPY N/A 08/11/2022   Procedure: ESOPHAGOGASTRODUODENOSCOPY (EGD);  Surgeon: Beverley Fiedler, MD;  Location: [facility] ENDOSCOPY;  Service: Gastroenterology;  Laterality: N/A;   ESOPHAGOGASTRODUODENOSCOPY (EGD) WITH PROPOFOL N/A 02/05/2021   Procedure: ESOPHAGOGASTRODUODENOSCOPY (EGD) WITH PROPOFOL;  Surgeon: Lanelle Bal, DO;  [facility];  Service: Endoscopy;  Laterality: N/A;   ESOPHAGOGASTRODUODENOSCOPY (EGD) WITH PROPOFOL N/A 08/27/2022   Procedure: ESOPHAGOGASTRODUODENOSCOPY (EGD) WITH PROPOFOL;  Surgeon: Lanelle Bal, DO;  [facility];  Service: Endoscopy;  Laterality: N/A;   GIVENS CAPSULE STUDY N/A 03/07/2019   Procedure: GIVENS CAPSULE STUDY;  Surgeon: Malissa Hippo, MD;  [facility];  Service: Endoscopy;  Laterality: N/A;  7:30   GIVENS CAPSULE STUDY N/A 04/22/2021   Procedure: GIVENS CAPSULE STUDY;  Surgeon: Malissa Hippo, MD;  [facility];  Service: Endoscopy;  Laterality: N/A;  7:30   GIVENS CAPSULE STUDY N/A 08/27/2022   Procedure: GIVENS CAPSULE STUDY;  Surgeon: Lanelle Bal, DO;  [facility];  Service: Endoscopy;  Laterality: N/A;   HOT HEMOSTASIS N/A 08/11/2022   Procedure: HOT HEMOSTASIS (ARGON PLASMA COAGULATION/BICAP);  Surgeon: Beverley Fiedler, MD;   Location: [facility] ENDOSCOPY;  Service: Gastroenterology;  Laterality: N/A;   INSERTION OF DIALYSIS CATHETER N/A 10/05/2020   Procedure: ABORTED TUNNELED DIALYSIS CATHETER PLACEMENT RIGHT INTERNAL JUGULAR VEIN ;  Surgeon: Lucretia Roers, MD;  Location: [facility]S;  Service: General;  Laterality: N/A;   INTRAOPERATIVE TRANSESOPHAGEAL ECHOCARDIOGRAM  06/13/2012   Procedure: INTRAOPERATIVE TRANSESOPHAGEAL ECHOCARDIOGRAM;  Surgeon: Delight Ovens, MD;  Location: [facility] OR;  Service: Open Heart Surgery;  Laterality: N/A;   IR DIALY SHUNT INTRO NEEDLE/INTRACATH INITIAL W/IMG LEFT Left 10/06/2020   IR FLUORO GUIDE CV LINE RIGHT  06/17/2020   IR GENERIC HISTORICAL  07/26/2016   IR FLUORO GUIDE CV LINE RIGHT 07/26/2016 Berdine Dance, MD [facility]   IR GENERIC HISTORICAL  07/26/2016   IR US GUIDE VASC ACCESS RIGHT 07/26/2016 Berdine Dance, MD [facility]   IR GENERIC HISTORICAL  08/02/2016   IR US GUIDE VASC ACCESS RIGHT 08/02/2016 Berdine Dance, MD [facility]   IR GENERIC HISTORICAL  08/02/2016   IR FLUORO GUIDE CV LINE RIGHT 08/02/2016 Berdine Dance, MD [facility]   IR RADIOLOGY PERIPHERAL GUIDED IV START  03/28/2017   IR REMOVAL TUN CV CATH W/O FL  08/11/2020   IR THROMBECTOMY AV FISTULA W/THROMBOLYSIS INC/SHUNT/IMG LEFT Left 06/17/2020   IR US GUIDE VASC ACCESS LEFT  06/17/2020   IR US GUIDE VASC ACCESS RIGHT  03/28/2017   IR US GUIDE VASC ACCESS RIGHT  06/17/2020   LEFT HEART CATH AND CORONARY ANGIOGRAPHY N/A 09/20/2016   Procedure: Left Heart Cath and Coronary Angiography;  Surgeon: Lyn Records, MD;  Location: [facility] INVASIVE CV LAB;  Service: Cardiovascular;  Laterality: N/A;   LEFT HEART CATH AND CORS/GRAFTS ANGIOGRAPHY N/A 10/13/2016   Procedure: Left Heart Cath and Cors/Grafts Angiography;  Surgeon: Lennette Bihari, MD;  [facility];  Service: Cardiovascular;  Laterality: N/A;   LEFT HEART CATH AND CORS/GRAFTS ANGIOGRAPHY N/A 07/13/2018   Procedure: LEFT HEART CATH AND CORS/GRAFTS ANGIOGRAPHY;   Surgeon: Swaziland, Peter M, MD;  Location: [facility] INVASIVE CV LAB;  Service: Cardiovascular;  Laterality: N/A;   LEFT HEART CATH AND CORS/GRAFTS ANGIOGRAPHY N/A 07/22/2021   Procedure: LEFT HEART CATH AND CORS/GRAFTS ANGIOGRAPHY;  Surgeon: Runell Gess, MD;  [facility]: [facility];  Service: Cardiovascular;  Laterality: N/A;   LEFT HEART CATHETERIZATION WITH CORONARY ANGIOGRAM N/A 12/15/2011   Procedure: LEFT HEART CATHETERIZATION WITH CORONARY ANGIOGRAM;  Surgeon: Kathleene Hazel, MD;  Location: [facility] CATH LAB;  Service: Cardiovascular;  Laterality: N/A;   LEFT HEART CATHETERIZATION WITH CORONARY ANGIOGRAM N/A 01/10/2012   Procedure: LEFT HEART CATHETERIZATION WITH CORONARY ANGIOGRAM;  Surgeon: Peter M Swaziland, MD;  Location: [facility] CATH LAB;  Service: Cardiovascular;  Laterality: N/A;   LEFT HEART CATHETERIZATION WITH CORONARY ANGIOGRAM  N/A 06/08/2012   Procedure: LEFT HEART CATHETERIZATION WITH CORONARY ANGIOGRAM;  Surgeon: Kathleene Hazel, MD;  Location: [facility] CATH LAB;  Service: Cardiovascular;  Laterality: N/A;   LEFT HEART CATHETERIZATION WITH CORONARY/GRAFT ANGIOGRAM N/A 12/10/2013   Procedure: LEFT HEART CATHETERIZATION WITH Isabel Caprice;  Surgeon: Corky Crafts, MD;  Location: [facility] CATH LAB;  Service: Cardiovascular;  Laterality: N/A;   OVARY SURGERY     ovarian cancer   POLYPECTOMY  03/09/2019   Procedure: POLYPECTOMY;  Surgeon: Malissa Hippo, MD;  [facility];  Service: Endoscopy;;  cecal    POLYPECTOMY N/A 09/26/2019   Procedure: DUODENAL POLYPECTOMY;  Surgeon: Malissa Hippo, MD;  [facility];  Service: Endoscopy;  Laterality: N/A;   POLYPECTOMY  08/11/2022   Procedure: POLYPECTOMY;  Surgeon: Beverley Fiedler, MD;  Location: [facility] ENDOSCOPY;  Service: Gastroenterology;;   REVISION OF ARTERIOVENOUS GORETEX GRAFT N/A 02/24/2017   Procedure: REVISION OF ARTERIOVENOUS GORETEX GRAFT (RESECTION);  Surgeon: Renford Dills, MD;  Location: [facility] ORS;   Service: Vascular;  Laterality: N/A;   REVISON OF ARTERIOVENOUS FISTULA Left 06/19/2020   Procedure: REVISION OF LEFT UPPER ARM AV GRAFT WITH INTERPOSITION JUMP GRAFT USING GORE LIMB;  Surgeon: Cephus Shelling, MD;  Location: [facility] OR;  Service: Vascular;  Laterality: Left;   SHUNTOGRAM N/A 10/15/2013   Procedure: Fistulogram;  Surgeon: Nada Libman, MD;  Location: [facility] CATH LAB;  Service: Cardiovascular;  Laterality: N/A;   THROMBECTOMY / ARTERIOVENOUS GRAFT REVISION  2011   left upper arm   TUBAL LIGATION  1980's   UPPER EXTREMITY ANGIOGRAPHY Bilateral 12/06/2016   Procedure: Upper Extremity Angiography;  Surgeon: Renford Dills, MD;  Location: [facility] INVASIVE CV LAB;  Service: Cardiovascular;  Laterality: Bilateral;   UPPER EXTREMITY INTERVENTION Left 06/06/2017   Procedure: UPPER EXTREMITY INTERVENTION;  Surgeon: Renford Dills, MD;  Location: [facility] INVASIVE CV LAB;  Service: Cardiovascular;  Laterality: Left;   UPPER EXTREMITY VENOGRAPHY Left 10/04/2022   Procedure: UPPER EXTREMITY VENOGRAPHY;  Surgeon: Renford Dills, MD;  Location: [facility] INVASIVE CV LAB;  Service: Cardiovascular;  Laterality: Left;   FH  Family History  Problem Relation Age of Onset   Heart disease Mother        Heart Disease before age 29   Hyperlipidemia Mother    Hypertension Mother    Diabetes Mother    Heart attack Mother    Heart disease Father        Heart Disease before age 28   Hyperlipidemia Father    Hypertension Father    Diabetes Father    Diabetes Sister    Hypertension Sister    Diabetes Brother    Hyperlipidemia Brother    Heart attack Brother    Hypertension Sister    Heart attack Brother    Colon cancer Child 47   Other Other        noncontributory for early CAD   Esophageal cancer Neg Hx    Liver disease Neg Hx    Kidney disease Neg Hx    Colon polyps Neg Hx    SH  reports that she has never smoked. She has never used smokeless tobacco. She reports that she does not  drink alcohol and does not use drugs. Allergies  Allergies  Allergen Reactions   Amlodipine Swelling   Aspirin Other (See Comments)    High Doses Mess up her stomach; "makes my bowels have blood in them". Takes 81 mg EC Aspirin  Nitrofurantoin Hives   Bactrim [Sulfamethoxazole-Trimethoprim] Rash   Contrast Media [Iodinated Contrast Media] Itching   Iron Itching and Other (See Comments)    "they gave me iron in dialysis; had to give me Benadryl cause I had to have the iron" (05/02/2012)   Tylenol [Acetaminophen] Itching and Other (See Comments)    Makes her feet on fire per pt - can tolerate per pt   Gabapentin Other (See Comments)    Unknown reaction   Iron Sucrose Other (See Comments)    Unknown   Ranexa [Ranolazine] Other (See Comments)    Myoclonus-hospitalized    Sucroferric Oxyhydroxide Other (See Comments)    Unknown   Dexilant [Dexlansoprazole] Other (See Comments)    Upset stomach   Hydralazine Itching    Has tolerated while inpatient Has tolerated while inpatient   Levaquin [Levofloxacin In D5w] Rash   Morphine And Related Itching and Other (See Comments)    Itching in feet   Pantoprazole Rash   Plavix [Clopidogrel Bisulfate] Rash   Protonix [Pantoprazole Sodium] Rash   Venofer [Ferric Oxide] Itching and Other (See Comments)    Patient reports using Benadryl prior to doses as [facility]   Home medications Prior to Admission medications   Medication Sig Start Date End Date Taking? Authorizing Provider  amiodarone (PACERONE) 100 MG tablet Take 1 tablet (100 mg total) by mouth daily. 04/27/22  Yes Rhetta Mura, MD  metoprolol tartrate (LOPRESSOR) 25 MG tablet Take 0.5 tablets (12.5 mg total) by mouth 2 (two) times daily. 03/04/22  Yes Branch, Dorothe Pea, MD  ALPRAZolam Prudy Feeler) 0.5 MG tablet Take 1 tablet (0.5 mg total) by mouth daily as needed for anxiety. 08/17/22   Jerald Kief, MD  Aspirin 81 MG CAPS Take 1 capsule by mouth daily at 12 noon.     [provider]  azithromycin (ZITHROMAX) 250 MG tablet Take 1 tablet (250 mg total) by mouth daily. Take first 2 tablets together, then 1 every day until finished. 10/02/22   Vanetta Mulders, MD  benzonatate (TESSALON) 100 MG capsule Take 100 mg by mouth 3 (three) times daily as needed for cough. 05/10/22   [provider]  doxycycline (VIBRAMYCIN) 100 MG capsule Take 1 capsule (100 mg total) by mouth 2 (two) times daily. 10/23/22   Jacalyn Lefevre, MD  folic acid (FOLVITE) 1 MG tablet Take 1 mg by mouth daily. 05/24/22   [provider]  guaiFENesin-codeine 100-10 MG/5ML syrup Take 5 mLs by mouth every 4 (four) hours as needed for cough. 10/23/22   Jacalyn Lefevre, MD  hydrOXYzine (ATARAX) 25 MG tablet Take 25 mg by mouth every 6 (six) hours as needed for itching. 06/06/22   [provider]  levothyroxine (SYNTHROID) 75 MCG tablet Take 1 tablet (75 mcg total) by mouth daily before breakfast. 08/14/22 10/06/22  Hughie Closs, MD  lisinopril (ZESTRIL) 20 MG tablet Take 1 tablet (20 mg total) by mouth daily. 08/18/22 10/06/22  Jerald Kief, MD  methylPREDNISolone (MEDROL DOSEPAK) 4 MG TBPK tablet Use as directed 10/19/22   Arthor Captain, PA-C  multivitamin (RENA-VIT) TABS tablet Take 1 tablet by mouth daily.    [provider]  nitroGLYCERIN (NITROSTAT) 0.4 MG SL tablet Place 0.4 mg under the tongue every 5 (five) minutes as needed for chest pain.    [provider]  omeprazole (PRILOSEC) 40 MG capsule Take 1 capsule (40 mg total) by mouth daily. 10/06/22   Aida Raider, NP  ondansetron (ZOFRAN-ODT) 4 MG  disintegrating tablet Take 4 mg by mouth every 8 (eight) hours as needed for nausea or vomiting. 05/05/22   [provider]  rosuvastatin (CRESTOR) 10 MG tablet Take 1 tablet by mouth once daily 06/06/22   Iran Ouch, [place] M, PA-C  sevelamer carbonate (RENVELA) 800 MG tablet Take 3,200 mg by mouth in the morning and at bedtime. 12/27/21    [provider]    Current Medications Scheduled Meds:  amiodarone  100 mg Oral Daily   calcitRIOL  1.75 mcg Oral Q M,W,F-HD   Chlorhexidine Gluconate Cloth  6 each Topical Daily   Chlorhexidine Gluconate Cloth  6 each Topical Q0600   cinacalcet  30 mg Oral Q M,W,F-HD   levETIRAcetam  500 mg Oral Daily   levothyroxine  75 mcg Oral QAC breakfast   lisinopril  20 mg Oral Daily   metoprolol tartrate  12.5 mg Oral BID   octreotide  100 mcg Subcutaneous Q12H   rosuvastatin  10 mg Oral Daily   Continuous Infusions:  anticoagulant sodium citrate     PRN Meds:.ALPRAZolam, alteplase, anticoagulant sodium citrate, heparin, hydrALAZINE, lidocaine (PF), lidocaine-prilocaine, ondansetron **OR** ondansetron (ZOFRAN) IV, pentafluoroprop-tetrafluoroeth  CBC Recent Labs  Lab 10/28/22 2129 10/29/22 1931 10/30/22 0546 10/30/22 1900 10/31/22 0541  WBC 6.2   < > 6.2 8.4 8.1  NEUTROABS 4.6  --   --   --   --   HGB 7.1*   < > 10.6* 10.9* 10.1*  HCT 22.1*   < > 32.5* 34.4* 30.8*  MCV 120.8*   < > 105.9* 110.3* 104.4*  PLT 215   < > 192 188 191   < > = values in this interval not displayed.   Basic Metabolic Panel Recent Labs  Lab 10/28/22 2129 10/30/22 0546 10/30/22 1900 10/31/22 0541  NA 136 137 133* 135  K 3.5 5.1 5.0 4.6  CL 99 100 100 102  CO2 25 25 15* 19*  GLUCOSE 136* 89 193* 130*  BUN 23 44* 55* 61*  CREATININE 3.63* 5.88* 6.72* 7.24*  CALCIUM 8.1* 7.4* 7.4* 7.5*  PHOS  --  5.6*  --   --     Physical Exam  Blood pressure (!) 181/47, pulse (!) 56, temperature 97.7 F (36.5 C), temperature source Oral, resp. rate 19, height 5\' 5"  (1.651 m), weight 56.8 kg, SpO2 98 %. GEN: Elderly female, chronically ill-appearing, NAD ENT: NCAT EYES: EOMI CV: Bradycardic, regular, normal S1 and S2, no rub PULM: Diminished in the bases, clear otherwise, normal work of breathing ABD: Soft, nontender, nondistended SKIN: No rashes or lesions, left IJ TDC site covered with  gauze EXT: No edema  Assessment 48F with recurrent GIB likely upper source, ESRD MWF, and waxing/waning encephalopathy/delirium with likely dementia.  ESRD MWF L IJ TDC [facility]: HD today, no heparin, Max UF 2L ABLA with likely UGI source, GI following. Transfused at presentation.  EGD/enteroscopy today HTN/Vol: Follow BPs with UF today,  on outpt Rx Anemia: as above CKD-BMD: Ca corrects to around 8, and P ok.  Continue calcitriol.  Cinacalcet is 3 times weekly.  If calcium follows further discontinue.  Resume binders once eating. AMS: Neurology following.  Waxing/waning.  Likely delirium.  Some concern for seizures.  Plan HD today on schedule Will continue to follow  Shawna Miss, MD   10/31/2022, 9:24 AM

## 2022-10-31 NOTE — Plan of Care (Signed)

## 2022-10-31 NOTE — Progress Notes (Addendum)
PROGRESS NOTE  LESSLY STIGLER BJY:[id number] DOB: [birth year] DOA: 10/28/2022 PCP: [facility]  Brief History:  82 year old female with a history of ESRD (MWF), coronary artery disease status post CABG, CHF, hypertension, hyperlipidemia, paroxysmal atrial fibrillation, hyperlipidemia, GI bleed presenting with generalized weakness and not feeling well after dialysis. At the time of admission, the patient was noted to have a hemoglobin of 7.1.  She was transfused 2 units PRBC.  GI was consulted to assist with management. Unfortunately, the patient had a rapid response called on the evening of 10/30/2022 secondary to altered mental status.  Apparently, the patient was noted to have seizure-like activity by nursing staff.  Apparently the patient was lethargic minimally responsive to painful stimuli.  There were shaking activity lasting 3 minutes noted by nursing staff.  Code stroke was activated.  CT of the brain was negative for hemorrhage.  There was some low-density subdural hygromas.  The patient was seen by teleneurology.  CTA of the head and neck was recommended, but family refused secondary to the patient's contrast allergy.  The patient was loaded with Keppra.  EEG was ordered.  They felt that the patient could stay at [facility] as long as she improves and follows commands.  She returns her to the ICU.  As the evening progressed, the patient was more responsive.  Apparently, the patient was refusing to follow commands but would speak when probed and speech was clear.  The patient did follow some simple commands.  The patient had recent hospitalization from 08/26/22 to 08/29/22 secondary to acute on chronic blood loss anemia.  Hemoccult was positive at that time.  The patmultiple gastric AVMs treated with APC, small hiatal hernia, no active or stigmata of bleeding.  Capsule was placed placed during EGD and showed few small AVMs in the small bowel, likely jejunum.  2 small polyps in  the distal small bowel previously noted. Capsule appears to sit at the ileocolonic anastomosis for the last 2 to 3 hours of study.  She was transfused 2 units PRBC during that hospital admission.  She is not felt to be a candidate for anticoagulation due to her recurrent GI bleeds.  The patient was noted to have hospital delirium and felt to have a degree of underlying cognitive impairment.  She also had a previous hospital admission from 08/06/2022 to 08/17/2022 due to acute on chronic encephalopathy during the hospitalization.  At best, it appears that the patient was oriented only to person at that time.  Family has noted that the patient has had confusion at home since wintertime 2023.  Once again, she had acute on chronic anemia.  She underwent EGD and colonoscopy on 08/10/2022.duodenal and colonic polyps which were removed. 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  She was seen by palliative medicine and full scope of care was continued.   Assessment/Plan: Acute on chronic blood loss anemia -GI consult appreciated -Transfused 1 unit PRBC this admission -Presented with hemoglobin 7.1 -Hemoglobin remained stable since transfusion -GI planning for possible push enteroscopy once stabilized -Blood loss felt to be secondary to the patient's gastric and small bowel AVMs -Continue octreotide  Myoclonus/Seizure like activity -The patient has had similar episodes of what was described on the evening of 10/30/2022 in the past previously felt to be secondary to anemia and gabapentin in the setting of ESRD patient -Appreciate neurology consult>>loaded keppra -Continue Keppra -10/31/22 AM-pt is lucid, A&Ox2, follows commands  ESRD -Dialyzes Monday, Wednesday, Friday -  Next dialysis 10/31/2022 -Appreciate nephrology follow-up  Paroxysmal Atrial fibrillation/SVT -Not a candidate for anticoagulation secondary to recurrent GI bleed -Continue amiodarone and metoprolol -currently in sinus  Essential hypertension -Continue  home medications -Well-documented that the patient has had uncontrolled hypertension  Coronary Artery Disease s/p CABG in 2013 with DES to D1 in 10/2016 with cath showing patent LIMA-LAD, SVG-RI, SVG-Mrg and SVG-RCA and repeat cath in 07/2018 showing patent grafts with occlusion of D1 at prior stent site and progression of PDA disease and medical management was recommended -continue imdur, ASA 81 mg, simavastatin  Hypothyroidism -Continue Synthroid  Cognitive impairment -Documented over the last several hospitalization the patient has had baseline cognitive impairment and hospital delirium -She is at high risk for recurrent delirium due to her metabolic derangement and hospital stays -10/30/2022 CT brain negative       Family Communication:  no Family at bedside  Consultants:  GI, neurology, renal  Code Status:  FULL /  DVT Prophylaxis:  SCDs   Procedures: As Listed in Progress Note Above  Antibiotics: None       Subjective: "That crazy girl kept sticking me last night"  Patient denies fevers, chills, headache, chest pain, dyspnea, nausea, vomiting, diarrhea, abdominal pain   Objective: Vitals:   10/31/22 0400 10/31/22 0500 10/31/22 0531 10/31/22 0630  BP: (!) 193/60  (!) 174/43 (!) 168/39  Pulse: 60  (!) 55 (!) 55  Resp: 17  16 20   Temp: 97.6 F (36.4 C)     TempSrc: Axillary     SpO2: 99%  100% 98%  Weight:  56.8 kg    Height:        Intake/Output Summary (Last 24 hours) at 10/31/2022 0706 Last data filed at 10/30/2022 1100 Gross per 24 hour  Intake 240 ml  Output --  Net 240 ml   Weight change:  Exam:  General:  Pt is alert, follows commands appropriately, not in acute distress HEENT: No icterus, No thrush, No neck mass, [place]/AT Cardiovascular: RRR, S1/S2, no rubs, no gallops Respiratory: bibasilar crackles.  No wheeze Abdomen: Soft/+BS, non tender, non distended, no guarding Extremities: No edema, No lymphangitis, No petechiae, No rashes, no  synovitis Neuro:  CN II-XII intact, strength 4/5 in RUE, RLE, strength 4/5 LUE, LLE; sensation intact bilateral; no dysmetria; babinski equivocal    Data Reviewed: I have personally reviewed following labs and imaging studies Basic Metabolic Panel: Recent Labs  Lab 10/28/22 2129 10/30/22 0546 10/30/22 1900 10/31/22 0541  NA 136 137 133* 135  K 3.5 5.1 5.0 4.6  CL 99 100 100 102  CO2 25 25 15* 19*  GLUCOSE 136* 89 193* 130*  BUN 23 44* 55* 61*  CREATININE 3.63* 5.88* 6.72* 7.24*  CALCIUM 8.1* 7.4* 7.4* 7.5*  MG  --   --  1.8  --   PHOS  --  5.6*  --   --    Liver Function Tests: Recent Labs  Lab 10/30/22 0546 10/30/22 1900  AST  --  15  ALT  --  13  ALKPHOS  --  103  BILITOT  --  1.0  PROT  --  6.6  ALBUMIN 3.3* 3.4*   No results for input(s): "LIPASE", "AMYLASE" in the last 168 hours. No results for input(s): "AMMONIA" in the last 168 hours. Coagulation Profile: No results for input(s): "INR", "PROTIME" in the last 168 hours. CBC: Recent Labs  Lab 10/28/22 2129 10/29/22 1931 10/30/22 0546 10/30/22 1900 10/31/22 0541  WBC 6.2 5.6 6.2  8.4 8.1  NEUTROABS 4.6  --   --   --   --   HGB 7.1* 9.9* 10.6* 10.9* 10.1*  HCT 22.1* 28.9* 32.5* 34.4* 30.8*  MCV 120.8* 102.5* 105.9* 110.3* 104.4*  PLT 215 188 192 188 191   Cardiac Enzymes: No results for input(s): "CKTOTAL", "CKMB", "CKMBINDEX", "TROPONINI" in the last 168 hours. BNP: Invalid input(s): "POCBNP" CBG: Recent Labs  Lab 10/30/22 1811  GLUCAP 126*   HbA1C: No results for input(s): "HGBA1C" in the last 72 hours. Urine analysis:    Component Value Date/Time   COLORURINE YELLOW 12/16/2012 1919   APPEARANCEUR CLOUDY (A) 12/16/2012 1919   LABSPEC 1.009 12/16/2012 1919   PHURINE 7.5 12/16/2012 1919   GLUCOSEU NEGATIVE 12/16/2012 1919   HGBUR TRACE (A) 12/16/2012 1919   BILIRUBINUR NEGATIVE 12/16/2012 1919   KETONESUR NEGATIVE 12/16/2012 1919   PROTEINUR 100 (A) 12/16/2012 1919   UROBILINOGEN 0.2  12/16/2012 1919   NITRITE NEGATIVE 12/16/2012 1919   LEUKOCYTESUR SMALL (A) 12/16/2012 1919   Sepsis Labs: (procalcitonin:4,lacticidven:4) ) Recent Results (from the past 240 hour(s))  Resp panel by RT-PCR (RSV, Flu A&B, Covid) Anterior Nasal Swab     Status: None   Collection Time: 10/23/22 10:34 AM   Specimen: Anterior Nasal Swab  Result Value Ref Range Status   SARS Coronavirus 2 by RT PCR NEGATIVE NEGATIVE Final    Comment: (NOTE) SARS-CoV-2 target nucleic acids are NOT DETECTED.  The SARS-CoV-2 RNA is generally detectable in upper respiratory specimens during the acute phase of infection. The lowest concentration of SARS-CoV-2 viral copies this assay can detect is 138 copies/mL. A negative result does not preclude SARS-Cov-2 infection and should not be used as the sole basis for treatment or other patient management decisions. A negative result may occur with  improper specimen collection/handling, submission of specimen other than nasopharyngeal swab, presence of viral mutation(s) within the areas targeted by this assay, and inadequate number of viral copies(<138 copies/mL). A negative result must be combined with clinical observations, patient history, and epidemiological information. The expected result is Negative.  Fact Sheet for Patients:  BloggerCourse.com  Fact Sheet for Healthcare Providers:  SeriousBroker.it  This test is no t yet approved or cleared by the [place] FDA and  has been authorized for detection and/or diagnosis of SARS-CoV-2 by FDA under an Emergency Use Authorization (EUA). This EUA will remain  in effect (meaning this test can be used) for the duration of the COVID-19 declaration under Section 564(b)(1) of the Act, 21 U.S.C.section 360bbb-3(b)(1), unless the authorization is terminated  or revoked sooner.       Influenza A by PCR NEGATIVE NEGATIVE Final   Influenza B by PCR  NEGATIVE NEGATIVE Final    Comment: (NOTE) The Xpert Xpress SARS-CoV-2/FLU/RSV plus assay is intended as an aid in the diagnosis of influenza from Nasopharyngeal swab specimens and should not be used as a sole basis for treatment. Nasal washings and aspirates are unacceptable for Xpert Xpress SARS-CoV-2/FLU/RSV testing.  Fact Sheet for Patients: BloggerCourse.com  Fact Sheet for Healthcare Providers: SeriousBroker.it  This test is not yet approved or cleared by the [place] FDA and has been authorized for detection and/or diagnosis of SARS-CoV-2 by FDA under an Emergency Use Authorization (EUA). This EUA will remain in effect (meaning this test can be used) for the duration of the COVID-19 declaration under Section 564(b)(1) of the Act, 21 U.S.C. section 360bbb-3(b)(1), unless the authorization is terminated or revoked.     Resp Syncytial  Virus by PCR NEGATIVE NEGATIVE Final    Comment: (NOTE) Fact Sheet for Patients: BloggerCourse.com  Fact Sheet for Healthcare Providers: SeriousBroker.it  This test is not yet approved or cleared by the [place] FDA and has been authorized for detection and/or diagnosis of SARS-CoV-2 by FDA under an Emergency Use Authorization (EUA). This EUA will remain in effect (meaning this test can be used) for the duration of the COVID-19 declaration under Section 564(b)(1) of the Act, 21 U.S.C. section 360bbb-3(b)(1), unless the authorization is terminated or revoked.  Performed at [facility], [address], [place], [place] [address]   MRSA Next Gen by PCR, Nasal     Status: None   Collection Time: 10/30/22  6:52 PM   Specimen: Nasal Mucosa; Nasal Swab  Result Value Ref Range Status   MRSA by PCR Next Gen NOT DETECTED NOT DETECTED Final    Comment: (NOTE) The GeneXpert MRSA Assay (FDA approved for NASAL specimens only), is one  component of a comprehensive MRSA colonization surveillance program. It is not intended to diagnose MRSA infection nor to guide or monitor treatment for MRSA infections. Test performance is not FDA approved in patients less than 63 years old. Performed at [facility], [address] [place], [place] [address]      Scheduled Meds:  amiodarone  100 mg Oral Daily   calcitRIOL  1.75 mcg Oral Q M,W,F-HD   Chlorhexidine Gluconate Cloth  6 each Topical Daily   Chlorhexidine Gluconate Cloth  6 each Topical Q0600   cinacalcet  30 mg Oral Q M,W,F-HD   levETIRAcetam  500 mg Oral Daily   levothyroxine  75 mcg Oral QAC breakfast   lisinopril  20 mg Oral Daily   metoprolol tartrate  12.5 mg Oral BID   octreotide  100 mcg Subcutaneous Q12H   rosuvastatin  10 mg Oral Daily   Continuous Infusions:  sodium chloride 100 mL/hr at 10/30/22 2118   anticoagulant sodium citrate      Procedures/Studies: CT HEAD CODE STROKE WO CONTRAST  Result Date: 10/30/2022 CLINICAL DATA:  Code stroke.  Seizure, altered mental status. EXAM: CT HEAD WITHOUT CONTRAST TECHNIQUE: Contiguous axial images were obtained from the base of the skull through the vertex without intravenous contrast. RADIATION DOSE REDUCTION: This exam was performed according to the departmental dose-optimization program which includes automated exposure control, adjustment of the mA and/or kV according to patient size and/or use of iterative reconstruction technique. COMPARISON:  CT head 08/26/2022. FINDINGS: Brain: There is no evidence of acute territorial infarct. There are low-density subdural collections overlying both cerebral hemispheres measuring up to 6 mm on the left and 3 mm on the right without evidence of acute blood products, new since the prior head CT from 08/26/2022. There is no mass effect on the underlying brain parenchyma. Parenchymal volume loss with prominence of the ventricular system and extra-axial CSF spaces is stable. The  ventricles are unchanged in size and configuration. A remote infarct in the left basal ganglia is unchanged. Additional confluent hypodensity in the supratentorial white matter likely reflects sequela of underlying chronic small-vessel ischemic change. The pituitary and suprasellar region are normal. There is no mass lesion. There is no midline shift. Vascular: There is calcification of the bilateral carotid siphons and vertebral arteries. Skull: Normal. Negative for fracture or focal lesion. Sinuses/Orbits: The paranasal sinuses are clear. The globes and orbits are unremarkable. Other: None. ASPECTS [facility] Stroke Program Early CT Score) - Ganglionic level infarction (caudate, lentiform nuclei, internal capsule, insula,  M1-M3 cortex): 7 - Supraganglionic infarction (M4-M6 cortex): 3 Total score (0-10 with 10 being normal): 10 IMPRESSION: 1. No evidence of acute intracranial hemorrhage or acute territorial infarct. 2. Low-density subdural hygromas overlying both cerebral hemispheres measuring up to 6 mm on the left and 3 mm on the right, new since the ct head from 08/26/2022 but without evidence of acute blood within the collections. These results communicated via Amion at the time of interpretation on 10/30/2022 at 6:44 pm to provider [facility] , who verbally acknowledged these results. Electronically Signed   By: Lesia Hausen M.D.   On: 10/30/2022 18:46   DG Chest Port 1 View  Result Date: 10/28/2022 CLINICAL DATA:  Cough EXAM: PORTABLE CHEST 1 VIEW COMPARISON:  10/23/2022 FINDINGS: Prior CABG. Left dialysis catheter remains in place with the tip in the right atrium. Mild cardiomegaly. No overt edema. Bibasilar atelectasis. No effusions or acute bony abnormality. IMPRESSION: Cardiomegaly, bibasilar atelectasis. Electronically Signed   By: Charlett Nose M.D.   On: 10/28/2022 22:15   CT Chest Wo Contrast  Result Date: 10/23/2022 CLINICAL DATA:  Pneumonia, complication suspected, xray done EXAM: CT CHEST  WITHOUT CONTRAST TECHNIQUE: Multidetector CT imaging of the chest was performed following the standard protocol without IV contrast. RADIATION DOSE REDUCTION: This exam was performed according to the departmental dose-optimization program which includes automated exposure control, adjustment of the mA and/or kV according to patient size and/or use of iterative reconstruction technique. COMPARISON:  Radiograph earlier today. Additional prior radiographs reviewed. Most recent chest CT available 07/16/2021 FINDINGS: Cardiovascular: Left-sided dialysis catheter tip is in the atrial caval junction. Mild cardiomegaly. Post CABG with calcification of native coronary arteries. Decreased density of the blood pool typical of anemia. Advanced aortic atherosclerosis with aortic tortuosity. No aneurysm or periaortic stranding. No pericardial effusion. Mediastinum/Nodes: 14 mm lower anterior paratracheal node, similar to 2023 exam. Scattered additional shoddy mediastinal lymph nodes are also unchanged. No progressive adenopathy. Patulous esophagus. Lungs/Pleura: Small bilateral pleural effusions, left slightly larger than right. Slight vascular congestion. Minimal fluid in the fissures. Mild patchy ground-glass opacity in the dependent left greater than right lower lobe. Moderate central bronchial thickening which may be congestive or bronchitic. no endobronchial debris. No pulmonary mass. Upper Abdomen: Sequela of chronic renal disease with renal atrophy and cysts. No specific imaging follow-up of these cysts is recommended. A peripherally calcified density in the right upper kidney is stable from 2023 exam and consistent with a complex cyst, also needing no further follow-up. Left colonic diverticulosis. Musculoskeletal: Exaggerated thoracic kyphosis with degenerative change throughout the thoracic spine. Chronic mild L1 compression deformity. Slight increase in Schmorl's node inferior endplate of T11. Prior median sternotomy.  There are no acute or suspicious osseous abnormalities. IMPRESSION: 1. Small bilateral pleural effusions, left slightly larger than right. Slight vascular congestion. 2. Mild patchy ground-glass opacity in the dependent left greater than right lower lobe may be atelectasis or pneumonia. 3. Moderate central bronchial thickening may be congestive or bronchitic. Aortic Atherosclerosis (ICD10-I70.0). Electronically Signed   By: Narda Rutherford M.D.   On: 10/23/2022 15:35   DG Chest Port 1 View  Result Date: 10/23/2022 CLINICAL DATA:  82 year old female with cough. Productive cough for 2 weeks. Some shortness of breath. EXAM: PORTABLE CHEST 1 VIEW COMPARISON:  Chest radiographs 10/02/2022 and earlier. FINDINGS: Portable AP upright view at 1107 hours. Prior CABG. Stable cardiomegaly and mediastinal contours. Calcified aortic atherosclerosis. Stable left chest dual lumen dialysis type catheter. Patchy, nonspecific increased left lung base opacity  partially obscuring the diaphragm now. No superimposed pneumothorax or consolidation. Stable pulmonary vascularity without overt edema. No confluent right lung opacity. Stable visualized osseous structures.  Negative visible bowel gas. IMPRESSION: 1. Patchy, nonspecific increased left lung base opacity new since last month. PA and lateral views may be helpful if feasible. 2. Otherwise stable cardiomegaly, pulmonary vascularity, prior CABG, chest dialysis catheter, Aortic Atherosclerosis. Electronically Signed   By: Odessa Fleming M.D.   On: 10/23/2022 11:16   PERIPHERAL VASCULAR CATHETERIZATION  Result Date: 10/04/2022 See surgical note for result.  DG Chest 2 View  Result Date: 10/02/2022 CLINICAL DATA:  cough EXAM: CHEST - 2 VIEW COMPARISON:  Chest x-ray 08/26/2022, CT chest 07/16/2021 FINDINGS: Left chest wall dialysis catheter with tip overlying the right atrium. The heart and mediastinal contours are unchanged. Atherosclerotic plaque. Coronary artery calcification. No  focal consolidation. Coarsened interstitial markings consistent with known bronchitic changes. No pleural effusion. No pneumothorax. No acute osseous abnormality.  Sternotomy wires are intact. IMPRESSION: 1. Bronchitic changes. 2.  Aortic Atherosclerosis (ICD10-I70.0). Electronically Signed   By: Tish Frederickson M.D.   On: 10/02/2022 19:08    [name], DO  [facility]  If 7PM-7AM, please contact night-coverage [email or web address] Password [email or web address] 10/31/2022, 7:06 AM   LOS: 2 days

## 2022-10-31 NOTE — Progress Notes (Signed)
EEG complete - results pending 

## 2022-10-31 NOTE — Progress Notes (Signed)
Gastroenterology Progress Note    Primary Gastroenterologist:  Dr. Levon Hedger   Patient ID: [name]; [id number]; 08/07/1940    Subjective   Talkative this morning. States abdomen is sore. No overt GI bleeding per nursing staff. Dialysis nurse at bedside. No N/V. Patient believes it is 82 and then reorients. She knows she is in [place] but not sure what city/town.    Objective   Vital signs in last 24 hours Temp:  [97.6 F (36.4 C)-98.4 F (36.9 C)] 97.7 F (36.5 C) (04/22 0727) Pulse Rate:  [47-60] 56 (04/22 0800) Resp:  [13-27] 19 (04/22 0800) BP: (147-221)/(39-77) 181/47 (04/22 0800) SpO2:  [95 %-100 %] 98 % (04/22 0800) FiO2 (%):  [28 %] 28 % (04/21 1913) Weight:  [56.8 kg] 56.8 kg (04/22 0500) Last BM Date : 10/28/22  Physical Exam General:   Alert and oriented to person Head:  Normocephalic and atraumatic. Abdomen:  Bowel sounds present, soft, non-tender, non-distended. No HSM or hernias noted. No rebound or guarding. No masses appreciated  Extremities:  Without  edema. Neurologic:  Alert and  oriented x4   Intake/Output from previous day: 04/21 0701 - 04/22 0700 In: 240 [P.O.:240] Out: -  Intake/Output this shift: No intake/output data recorded.  Lab Results  Recent Labs    10/30/22 0546 10/30/22 1900 10/31/22 0541  WBC 6.2 8.4 8.1  HGB 10.6* 10.9* 10.1*  HCT 32.5* 34.4* 30.8*  PLT 192 188 191   BMET Recent Labs    10/30/22 0546 10/30/22 1900 10/31/22 0541  NA 137 133* 135  K 5.1 5.0 4.6  CL 100 100 102  CO2 25 15* 19*  GLUCOSE 89 193* 130*  BUN 44* 55* 61*  CREATININE 5.88* 6.72* 7.24*  CALCIUM 7.4* 7.4* 7.5*   LFT Recent Labs    10/30/22 0546 10/30/22 1900  PROT  --  6.6  ALBUMIN 3.3* 3.4*  AST  --  15  ALT  --  13  ALKPHOS  --  103  BILITOT  --  1.0    Hepatitis Panel Recent Labs    10/30/22 0554  HEPBSAG NON REACTIVE   Studies/Results DG Abd 1 View  Result Date: 10/31/2022 CLINICAL DATA:  Abdominal pain EXAM:  ABDOMEN - 1 VIEW COMPARISON:  08/29/2022 FINDINGS: Bowel gas pattern is nonspecific. Gas and stool are noted in colon. Small amount of stool is seen in colon. There are surgical staples and clips in lower abdomen and pelvis. There is no identifiable electronic device in abdomen and pelvis in the current study. In the previous examination, an electronic device, capsule most noted in region of rectum suggesting interval passage. Arterial calcifications are seen. IMPRESSION: Nonspecific bowel gas pattern. There is no evidence of any metallic electronic capsule in the abdomen and pelvis. Electronically Signed   By: Ernie Avena M.D.   On: 10/31/2022 08:40   CT HEAD CODE STROKE WO CONTRAST  Result Date: 10/30/2022 CLINICAL DATA:  Code stroke.  Seizure, altered mental status. EXAM: CT HEAD WITHOUT CONTRAST TECHNIQUE: Contiguous axial images were obtained from the base of the skull through the vertex without intravenous contrast. RADIATION DOSE REDUCTION: This exam was performed according to the departmental dose-optimization program which includes automated exposure control, adjustment of the mA and/or kV according to patient size and/or use of iterative reconstruction technique. COMPARISON:  CT head 08/26/2022. FINDINGS: Brain: There is no evidence of acute territorial infarct. There are low-density subdural collections overlying both cerebral hemispheres measuring up to 6 mm on  the left and 3 mm on the right without evidence of acute blood products, new since the prior head CT from 08/26/2022. There is no mass effect on the underlying brain parenchyma. Parenchymal volume loss with prominence of the ventricular system and extra-axial CSF spaces is stable. The ventricles are unchanged in size and configuration. A remote infarct in the left basal ganglia is unchanged. Additional confluent hypodensity in the supratentorial white matter likely reflects sequela of underlying chronic small-vessel ischemic change.  The pituitary and suprasellar region are normal. There is no mass lesion. There is no midline shift. Vascular: There is calcification of the bilateral carotid siphons and vertebral arteries. Skull: Normal. Negative for fracture or focal lesion. Sinuses/Orbits: The paranasal sinuses are clear. The globes and orbits are unremarkable. Other: None. ASPECTS [facility] Stroke Program Early CT Score) - Ganglionic level infarction (caudate, lentiform nuclei, internal capsule, insula, M1-M3 cortex): 7 - Supraganglionic infarction (M4-M6 cortex): 3 Total score (0-10 with 10 being normal): 10 IMPRESSION: 1. No evidence of acute intracranial hemorrhage or acute territorial infarct. 2. Low-density subdural hygromas overlying both cerebral hemispheres measuring up to 6 mm on the left and 3 mm on the right, new since the ct head from 08/26/2022 but without evidence of acute blood within the collections. These results communicated via Amion at the time of interpretation on 10/30/2022 at 6:44 pm to provider [facility] , who verbally acknowledged these results. Electronically Signed   By: Lesia Hausen M.D.   On: 10/30/2022 18:46   DG Chest Port 1 View  Result Date: 10/28/2022 CLINICAL DATA:  Cough EXAM: PORTABLE CHEST 1 VIEW COMPARISON:  10/23/2022 FINDINGS: Prior CABG. Left dialysis catheter remains in place with the tip in the right atrium. Mild cardiomegaly. No overt edema. Bibasilar atelectasis. No effusions or acute bony abnormality. IMPRESSION: Cardiomegaly, bibasilar atelectasis. Electronically Signed   By: Charlett Nose M.D.   On: 10/28/2022 22:15   CT Chest Wo Contrast  Result Date: 10/23/2022 CLINICAL DATA:  Pneumonia, complication suspected, xray done EXAM: CT CHEST WITHOUT CONTRAST TECHNIQUE: Multidetector CT imaging of the chest was performed following the standard protocol without IV contrast. RADIATION DOSE REDUCTION: This exam was performed according to the departmental dose-optimization program which  includes automated exposure control, adjustment of the mA and/or kV according to patient size and/or use of iterative reconstruction technique. COMPARISON:  Radiograph earlier today. Additional prior radiographs reviewed. Most recent chest CT available 07/16/2021 FINDINGS: Cardiovascular: Left-sided dialysis catheter tip is in the atrial caval junction. Mild cardiomegaly. Post CABG with calcification of native coronary arteries. Decreased density of the blood pool typical of anemia. Advanced aortic atherosclerosis with aortic tortuosity. No aneurysm or periaortic stranding. No pericardial effusion. Mediastinum/Nodes: 14 mm lower anterior paratracheal node, similar to 2023 exam. Scattered additional shoddy mediastinal lymph nodes are also unchanged. No progressive adenopathy. Patulous esophagus. Lungs/Pleura: Small bilateral pleural effusions, left slightly larger than right. Slight vascular congestion. Minimal fluid in the fissures. Mild patchy ground-glass opacity in the dependent left greater than right lower lobe. Moderate central bronchial thickening which may be congestive or bronchitic. no endobronchial debris. No pulmonary mass. Upper Abdomen: Sequela of chronic renal disease with renal atrophy and cysts. No specific imaging follow-up of these cysts is recommended. A peripherally calcified density in the right upper kidney is stable from 2023 exam and consistent with a complex cyst, also needing no further follow-up. Left colonic diverticulosis. Musculoskeletal: Exaggerated thoracic kyphosis with degenerative change throughout the thoracic spine. Chronic mild L1 compression deformity. Slight increase  in Schmorl's node inferior endplate of T11. Prior median sternotomy. There are no acute or suspicious osseous abnormalities. IMPRESSION: 1. Small bilateral pleural effusions, left slightly larger than right. Slight vascular congestion. 2. Mild patchy ground-glass opacity in the dependent left greater than right  lower lobe may be atelectasis or pneumonia. 3. Moderate central bronchial thickening may be congestive or bronchitic. Aortic Atherosclerosis (ICD10-I70.0). Electronically Signed   By: Narda Rutherford M.D.   On: 10/23/2022 15:35   DG Chest Port 1 View  Result Date: 10/23/2022 CLINICAL DATA:  82 year old female with cough. Productive cough for 2 weeks. Some shortness of breath. EXAM: PORTABLE CHEST 1 VIEW COMPARISON:  Chest radiographs 10/02/2022 and earlier. FINDINGS: Portable AP upright view at 1107 hours. Prior CABG. Stable cardiomegaly and mediastinal contours. Calcified aortic atherosclerosis. Stable left chest dual lumen dialysis type catheter. Patchy, nonspecific increased left lung base opacity partially obscuring the diaphragm now. No superimposed pneumothorax or consolidation. Stable pulmonary vascularity without overt edema. No confluent right lung opacity. Stable visualized osseous structures.  Negative visible bowel gas. IMPRESSION: 1. Patchy, nonspecific increased left lung base opacity new since last month. PA and lateral views may be helpful if feasible. 2. Otherwise stable cardiomegaly, pulmonary vascularity, prior CABG, chest dialysis catheter, Aortic Atherosclerosis. Electronically Signed   By: Odessa Fleming M.D.   On: 10/23/2022 11:16   PERIPHERAL VASCULAR CATHETERIZATION  Result Date: 10/04/2022 See surgical note for result.  DG Chest 2 View  Result Date: 10/02/2022 CLINICAL DATA:  cough EXAM: CHEST - 2 VIEW COMPARISON:  Chest x-ray 08/26/2022, CT chest 07/16/2021 FINDINGS: Left chest wall dialysis catheter with tip overlying the right atrium. The heart and mediastinal contours are unchanged. Atherosclerotic plaque. Coronary artery calcification. No focal consolidation. Coarsened interstitial markings consistent with known bronchitic changes. No pleural effusion. No pneumothorax. No acute osseous abnormality.  Sternotomy wires are intact. IMPRESSION: 1. Bronchitic changes. 2.  Aortic  Atherosclerosis (ICD10-I70.0). Electronically Signed   By: Tish Frederickson M.D.   On: 10/02/2022 19:08    Assessment  82 y.o. female with a history of dementia, ESRD on HD, Afib on ASA, CAD, congestive heart failure, small bowel AVMs, GERD, hypertension, colon cancer s/p resection in 1992, duodenal adenoma, presenting this admission with weakness and fatigue following dialysis.   Hgb 7.1 on admission, receiving 1 unit PRBCs. Hgb 10.1 today without overt GI bleeding. Unknown hemoccult status. Prior history of gastric AVMs s/p APC therapy in Feb 2024. Capsule with small AVMs in small bowel, possible jejunum, and 2 small polyps in distal small bowel.   Undergoing dialysis this morning. Possible seizure yesterday evening. MRI brain to be completed following dialysis today. Neurology has been consulted.   Will hold off on EGD/enteroscopy today and plan for 4/23.     Plan / Recommendations  May have clear liquids, NPO after midnight Complete dialysis today MRI brain as planned after dialysis EGD 4/23  Neurology consult pending    LOS: 2 days    10/31/2022, 9:28 AM  Gelene Mink, PhD, ANP-BC [facility]

## 2022-10-31 NOTE — Progress Notes (Signed)
Verbal order to allow for permissive hypertension per neurology. Keep SBP<220, DBP <100. See vitals and MAR. Will continue to monitor.

## 2022-10-31 NOTE — Hospital Course (Signed)
82 year old female with a history of ESRD (MWF), coronary artery disease status post CABG, CHF, hypertension, hyperlipidemia, paroxysmal atrial fibrillation, hyperlipidemia, GI bleed presenting with generalized weakness and not feeling well after dialysis. At the time of admission, the patient was noted to have a hemoglobin of 7.1.  She was transfused 2 units PRBC.  GI was consulted to assist with management. Unfortunately, the patient had a rapid response called on the evening of 10/30/2022 secondary to altered mental status.  Apparently, the patient was noted to have seizure-like activity by nursing staff.  Apparently the patient was lethargic minimally responsive to painful stimuli.  There were shaking activity lasting 3 minutes noted by nursing staff.  Code stroke was activated.  CT of the brain was negative for hemorrhage.  There was some low-density subdural hygromas.  The patient was seen by teleneurology.  CTA of the head and neck was recommended, but family refused secondary to the patient's contrast allergy.  The patient was loaded with Keppra.  EEG was ordered.  They felt that the patient could stay at [facility] as long as she improves and follows commands.  She returns her to the ICU.  As the evening progressed, the patient was more responsive.  Apparently, the patient was refusing to follow commands but would speak when probed and speech was clear.  The patient did follow some simple commands.  The patient had recent hospitalization from 08/26/22 to 08/29/22 secondary to acute on chronic blood loss anemia.  Hemoccult was positive at that time.  The patmultiple gastric AVMs treated with APC, small hiatal hernia, no active or stigmata of bleeding.  Capsule was placed placed during EGD and showed few small AVMs in the small bowel, likely jejunum.  2 small polyps in the distal small bowel previously noted. Capsule appears to sit at the ileocolonic anastomosis for the last 2 to 3 hours of study.  She was  transfused 2 units PRBC during that hospital admission.  She is not felt to be a candidate for anticoagulation due to her recurrent GI bleeds.  The patient was noted to have hospital delirium and felt to have a degree of underlying cognitive impairment.  She also had a previous hospital admission from 08/06/2022 to 08/17/2022 due to acute on chronic encephalopathy during the hospitalization.  At best, it appears that the patient was oriented only to person at that time.  Family has noted that the patient has had confusion at home since wintertime 2023.  Once again, she had acute on chronic anemia.  She underwent EGD and colonoscopy on 08/10/2022.duodenal and colonic polyps which were removed. 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  She was seen by palliative medicine and full scope of care was continued.

## 2022-11-01 ENCOUNTER — Inpatient Hospital Stay (HOSPITAL_COMMUNITY): Payer: Medicare Other | Admitting: Anesthesiology

## 2022-11-01 ENCOUNTER — Encounter (HOSPITAL_COMMUNITY): Payer: Self-pay | Admitting: Internal Medicine

## 2022-11-01 ENCOUNTER — Encounter (HOSPITAL_COMMUNITY)
Admission: EM | Disposition: A | Discharge status: Home Health | Payer: Self-pay | Source: Home / Self Care | Attending: Family Medicine

## 2022-11-01 DIAGNOSIS — Z7984 Long term (current) use of oral hypoglycemic drugs: Secondary | ICD-10-CM

## 2022-11-01 DIAGNOSIS — K259 Gastric ulcer, unspecified as acute or chronic, without hemorrhage or perforation: Secondary | ICD-10-CM

## 2022-11-01 DIAGNOSIS — D1339 Benign neoplasm of other parts of small intestine: Secondary | ICD-10-CM

## 2022-11-01 DIAGNOSIS — E039 Hypothyroidism, unspecified: Secondary | ICD-10-CM

## 2022-11-01 DIAGNOSIS — D509 Iron deficiency anemia, unspecified: Secondary | ICD-10-CM | POA: Diagnosis not present

## 2022-11-01 DIAGNOSIS — K449 Diaphragmatic hernia without obstruction or gangrene: Secondary | ICD-10-CM

## 2022-11-01 DIAGNOSIS — E1149 Type 2 diabetes mellitus with other diabetic neurological complication: Secondary | ICD-10-CM

## 2022-11-01 DIAGNOSIS — N186 End stage renal disease: Secondary | ICD-10-CM | POA: Diagnosis not present

## 2022-11-01 DIAGNOSIS — K5710 Diverticulosis of small intestine without perforation or abscess without bleeding: Secondary | ICD-10-CM

## 2022-11-01 DIAGNOSIS — D649 Anemia, unspecified: Secondary | ICD-10-CM | POA: Diagnosis not present

## 2022-11-01 DIAGNOSIS — D62 Acute posthemorrhagic anemia: Secondary | ICD-10-CM | POA: Diagnosis not present

## 2022-11-01 DIAGNOSIS — G9341 Metabolic encephalopathy: Secondary | ICD-10-CM | POA: Diagnosis not present

## 2022-11-01 HISTORY — PX: ENTEROSCOPY: SHX5533

## 2022-11-01 HISTORY — PX: BIOPSY: SHX5522

## 2022-11-01 HISTORY — PX: SUBMUCOSAL TATTOO INJECTION: SHX6856

## 2022-11-01 LAB — BASIC METABOLIC PANEL
Anion gap: 10 (ref 5–15)
BUN: 30 mg/dL — ABNORMAL HIGH (ref 8–23)
CO2: 25 mmol/L (ref 22–32)
Calcium: 7.2 mg/dL — ABNORMAL LOW (ref 8.9–10.3)
Chloride: 97 mmol/L — ABNORMAL LOW (ref 98–111)
Creatinine, Ser: 5.05 mg/dL — ABNORMAL HIGH (ref 0.44–1.00)
GFR, Estimated: 8 mL/min — ABNORMAL LOW (ref >60)
Glucose, Bld: 87 mg/dL (ref 70–99)
Potassium: 3.9 mmol/L (ref 3.5–5.1)
Sodium: 132 mmol/L — ABNORMAL LOW (ref 135–145)

## 2022-11-01 LAB — HEPATITIS B SURFACE ANTIBODY, QUANTITATIVE: Hep B S AB Quant (Post): 392 m[IU]/mL (ref >9.9)

## 2022-11-01 LAB — GLUCOSE, CAPILLARY: Glucose-Capillary: 67 mg/dL — ABNORMAL LOW (ref 70–99)

## 2022-11-01 SURGERY — ENTEROSCOPY
Anesthesia: General

## 2022-11-01 MED ORDER — DEXTROSE 50 % IV SOLN
INTRAVENOUS | Status: AC
  Filled 2022-11-01: qty 50

## 2022-11-01 MED ORDER — SPOT INK MARKER SYRINGE KIT
PACK | SUBMUCOSAL | Status: DC | PRN
  Administered 2022-11-01: 2 mL via SUBMUCOSAL

## 2022-11-01 MED ORDER — SODIUM CHLORIDE 0.9 % IV SOLN: INTRAVENOUS | Status: DC

## 2022-11-01 MED ORDER — PROPOFOL 10 MG/ML IV BOLUS
INTRAVENOUS | Status: DC | PRN
  Administered 2022-11-01: 30 mg via INTRAVENOUS
  Administered 2022-11-01: 80 mg via INTRAVENOUS
  Administered 2022-11-01: 20 mg via INTRAVENOUS

## 2022-11-01 MED ORDER — EPHEDRINE SULFATE (PRESSORS) 50 MG/ML IJ SOLN
INTRAMUSCULAR | Status: DC | PRN
  Administered 2022-11-01: 5 mg via INTRAVENOUS
  Administered 2022-11-01: 10 mg via INTRAVENOUS
  Administered 2022-11-01 (×2): 5 mg via INTRAVENOUS

## 2022-11-01 MED ORDER — DEXTROSE 50 % IV SOLN
INTRAVENOUS | Status: DC | PRN
  Administered 2022-11-01: 12.5 g via INTRAVENOUS

## 2022-11-01 MED ORDER — HYDROMORPHONE HCL 1 MG/ML IJ SOLN
0.5000 mg | INTRAMUSCULAR | Status: DC | PRN
  Administered 2022-11-01 – 2022-11-08 (×17): 0.5 mg via INTRAVENOUS
  Filled 2022-11-01 (×17): qty 0.5

## 2022-11-01 MED ORDER — STERILE WATER FOR IRRIGATION IR SOLN
Status: DC | PRN
  Administered 2022-11-01: 60 mL

## 2022-11-01 NOTE — Op Note (Signed)
[facility] Patient Name: Shawna [name] Procedure Date: 11/01/2022 4:06 PM MRN: [id number] Date of Birth: [birth year] Attending MD: [name] , , [id number] CSN: [id number] Age: 82 Admit Type: Inpatient Procedure:                Small bowel enteroscopy Indications:              Iron deficiency anemia Providers:                [name], Angelica Ran, Lennice Sites                            Technician, Technician Referring MD:              Medicines:                Monitored Anesthesia Care Complications:            No immediate complications. Estimated Blood Loss:     Estimated blood loss: none. Procedure:                Pre-Anesthesia Assessment:                           - Prior to the procedure, a History and Physical                            was performed, and patient medications, allergies                            and sensitivities were reviewed. The patient's                            tolerance of previous anesthesia was reviewed.                           - The risks and benefits of the procedure and the                            sedation options and risks were discussed with the                            patient. All questions were answered and informed                            consent was obtained.                           - ASA Grade Assessment: III - A patient with severe                            systemic disease.                           After obtaining informed consent, the endoscope was                            passed under direct vision. Throughout the  procedure, the patient's blood pressure, pulse, and                            oxygen saturations were monitored continuously. The                            [id number] scope was introduced through                            the mouth and advanced to the proximal jejunum. The                            small bowel enteroscopy was accomplished without                             difficulty. The patient tolerated the procedure                            well. Scope In: 4:29:54 PM Scope Out: 4:44:37 PM Total Procedure Duration: 0 hours 14 minutes 43 seconds  Findings:      A 2 cm hiatal hernia was present.      One non-bleeding superficial gastric ulcer with a clean ulcer base       (Forrest Class III) was found in the gastric antrum. The lesion was 8 mm       in largest dimension. Biopsies were taken with a cold forceps for       Helicobacter pylori testing.      There was no evidence of significant pathology in the entire examined       duodenum.      A 10 mm sessile polyp with central depression with no bleeding was found       in the proximal jejunum. Biopsies were taken with a cold forceps for       histology. Area was tattooed with an injection of 2 mL of Spot (carbon       black).      A few diverticula were found in the proximal jejunum.      No presence of AVMs in this examination. Impression:               - 2 cm hiatal hernia.                           - Non-bleeding gastric ulcer with a clean ulcer                            base (Forrest Class III). Biopsied.                           - Normal examined duodenum.                           - Jejunal polyp(s). Biopsied. Tattooed.                           - Non-bleeding jejunal diverticula. Moderate Sedation:      Per Anesthesia Care Recommendation:           -  Return patient to hospital ward for ongoing care.                           - Renal diet.                           - Daily H/H                           - Await pathology results.                           - Continue octreotide 100 mcg BID, will need to be                            discharged on this medication. Procedure Code(s):        --- Professional ---                           [phone], Small intestinal endoscopy, enteroscopy                            beyond second portion of duodenum, not including                             ileum; with biopsy, single or multiple                           [id number], Unlisted procedure, small intestine Diagnosis Code(s):        --- Professional ---                           K44.9, Diaphragmatic hernia without obstruction or                            gangrene                           K25.9, Gastric ulcer, unspecified as acute or                            chronic, without hemorrhage or perforation                           D13.39, Benign neoplasm of other parts of small                            intestine                           D50.9, Iron deficiency anemia, unspecified                           K57.10, Diverticulosis of small intestine without                            perforation or abscess without bleeding CPT  copyright 2022 American Medical Association. All rights reserved. The codes documented in this report are preliminary and upon coder review may  be revised to meet current compliance requirements. [name], MD [name],  11/01/2022 4:51:46 PM This report has been signed electronically. Number of Addenda: 0

## 2022-11-01 NOTE — Progress Notes (Signed)
Patient transported down to preop at this time.  No s/s of distress noted denies pain.

## 2022-11-01 NOTE — Plan of Care (Signed)
  Problem: Education: Goal: Knowledge of General Education information will improve Description: Including pain rating scale, medication(s)/side effects and non-pharmacologic comfort measures Outcome: Not Progressing   Problem: Health Behavior/Discharge Planning: Goal: Ability to manage health-related needs will improve Outcome: Not Progressing   Problem: Clinical Measurements: Goal: Ability to maintain clinical measurements within normal limits will improve Outcome: Not Progressing Goal: Will remain free from infection Outcome: Not Progressing Goal: Diagnostic test results will improve Outcome: Not Progressing Goal: Respiratory complications will improve Outcome: Not Progressing Goal: Cardiovascular complication will be avoided Outcome: Not Progressing   Problem: Activity: Goal: Risk for activity intolerance will decrease Outcome: Not Progressing   Problem: Nutrition: Goal: Adequate nutrition will be maintained Outcome: Not Progressing   Problem: Coping: Goal: Level of anxiety will decrease Outcome: Not Progressing   Problem: Elimination: Goal: Will not experience complications related to bowel motility Outcome: Not Progressing Goal: Will not experience complications related to urinary retention Outcome: Not Progressing   Problem: Pain Managment: Goal: General experience of comfort will improve Outcome: Not Progressing   Problem: Safety: Goal: Ability to remain free from injury will improve Outcome: Not Progressing   Problem: Skin Integrity: Goal: Risk for impaired skin integrity will decrease Outcome: Not Progressing   

## 2022-11-01 NOTE — Brief Op Note (Signed)
11/01/2022  4:50 PM  PATIENT:  Shawna [name]  82 y.o. female  PRE-OPERATIVE DIAGNOSIS:  IDA, history of small bowel AVMs  POST-OPERATIVE DIAGNOSIS:  Gastric Ulcer , Gastric Biopsies, Diverticulosis of Small Bowel , Ulcerated Nodule of Small bowel with Jejunal biopsies, Hiatel Hernia  PROCEDURE:  Procedure(s): ENTEROSCOPY (N/A) BIOPSY SUBMUCOSAL TATTOO INJECTION  SURGEON:  Surgeon(s) and Role:    * Dolores Frame, MD - Primary  Patient underwent push enteroscopy under propofol sedation.  Tolerated the procedure adequately.    FINDINGS - 2 cm hiatal hernia.  - Non-bleeding gastric ulcer with a clean ulcer base (Forrest Class III).  Biopsied.  - Normal examined duodenum.  - Jejunal polyp(s).  Biopsied.  Tattooed.  - Non-bleeding jejunal diverticula.   RECOMMENDATIONS - Return patient to hospital ward for ongoing care.  - Renal diet.  - Daily H/H - Await pathology results.  - Continue octreotide 100 mcg BID, will need to be discharged on this medication.  [name], MD Gastroenterology and Hepatology [facility]

## 2022-11-01 NOTE — Progress Notes (Signed)
We will proceed with push enteroscopy as scheduled.  I thoroughly discussed with the patient the procedure, including the risks involved. Patient understands what the procedure involves including the benefits and any risks. Patient understands alternatives to the proposed procedure. Risks including (but not limited to) bleeding, tearing of the lining (perforation), rupture of adjacent organs, problems with heart and lung function, infection, and medication reactions. A small percentage of complications may require surgery, hospitalization, repeat endoscopic procedure, and/or transfusion.  Patient understood and agreed.  [name], MD Gastroenterology and Hepatology [facility]enter[facility]

## 2022-11-01 NOTE — Anesthesia Postprocedure Evaluation (Signed)
Anesthesia Post Note  Patient: RONNICA DREESE  Procedure(s) Performed: ENTEROSCOPY BIOPSY SUBMUCOSAL TATTOO INJECTION  Patient location during evaluation: Phase II Anesthesia Type: General Level of consciousness: awake and alert and oriented Pain management: pain level controlled Vital Signs Assessment: post-procedure vital signs reviewed and stable Respiratory status: spontaneous breathing, nonlabored ventilation and respiratory function stable Cardiovascular status: blood pressure returned to baseline and stable Postop Assessment: no apparent nausea or vomiting Anesthetic complications: no  No notable events documented.   Last Vitals:  Vitals:   11/01/22 1656 11/01/22 1700  BP: (!) 104/32 119/60  Pulse: (!) 51 (!) 50  Resp: (!) 23 19  Temp: (!) 36.4 C (!) 36.4 C  SpO2: 98% 98%    Last Pain:  Vitals:   11/01/22 1700  TempSrc:   PainSc: 0-No pain                 Enisa Runyan C Cassandra Harbold

## 2022-11-01 NOTE — Progress Notes (Signed)
Admit: 10/28/2022 LOS: 3  59F with recurrent GIB likely upper source, ESRD MWF, and waxing/waning encephalopathy/delirium with likely dementia.   Subjective:  HD yesterday with 1.7 L ultrafiltration, limited by tachycardia which resolved after dialysis. Blood pressures improved overnight and this morning EGD delayed to today No a.m. labs  04/22 0701 - 04/23 0700 In: 360 [P.O.:360] Out: 1700   Filed Weights   10/31/22 0500 10/31/22 0930 10/31/22 1332  Weight: 56.8 kg 56.8 kg 54.2 kg    Scheduled Meds:  amiodarone  100 mg Oral Daily   calcitRIOL  1.75 mcg Oral Q M,W,F-HD   Chlorhexidine Gluconate Cloth  6 each Topical Daily   Chlorhexidine Gluconate Cloth  6 each Topical Q0600   cinacalcet  30 mg Oral Q M,W,F-HD   cloNIDine  0.1 mg Oral BID   levETIRAcetam  500 mg Oral Daily   levothyroxine  75 mcg Oral QAC breakfast   lisinopril  20 mg Oral Daily   metoprolol tartrate  12.5 mg Oral BID   NIFEdipine  60 mg Oral Daily   octreotide  100 mcg Subcutaneous Q12H   rosuvastatin  10 mg Oral Daily   Continuous Infusions:  anticoagulant sodium citrate     PRN Meds:.ALPRAZolam, alteplase, anticoagulant sodium citrate, guaiFENesin-dextromethorphan, heparin, hydrALAZINE, lidocaine (PF), lidocaine-prilocaine, ondansetron **OR** ondansetron (ZOFRAN) IV, pentafluoroprop-tetrafluoroeth  Current Labs: reviewed    Physical Exam:  Blood pressure (!) 117/33, pulse (!) 51, temperature 98.4 F (36.9 C), temperature source Oral, resp. rate 18, height  (1.651 m), weight 54.2 kg, SpO2 100 %. GEN: Elderly female, chronically ill-appearing, NAD ENT: NCAT EYES: EOMI CV: Bradycardic, regular, normal S1 and S2, no rub PULM: Diminished in the bases, clear otherwise, normal work of breathing ABD: Soft, nontender, nondistended SKIN: No rashes or lesions, left IJ TDC  EXT: No edema  A ESRD MWF L IJ TDC [facility]: on scheudle., no heparin, Max UF 2L ABLA with likely UGI source, GI following.  Transfused at presentation.  EGD/enteroscopy 4/23 HTN/Vol:BPs improved with UF, on outpt Rx Anemia: as above CKD-BMD: Ca corrects to around 8, and P ok.  Continue calcitriol.  Cinacalcet is 3 times weekly.  If calcium cont to drop further discontinue cinacalcet.  Resume binders once eating. AMS: Neurology s/o.  Waxing/waning.  Likely delirium.  Some concern for seizures.  P HD tomorrow, 2L, no heparin. TDC, 3.5h Medication Issues; Preferred narcotic agents for pain control are hydromorphone, fentanyl, and methadone. Morphine should not be used.  Baclofen should be avoided Avoid oral sodium phosphate and magnesium citrate based laxatives / bowel preps    Sabra Heck MD 11/01/2022, 9:15 AM  Recent Labs  Lab 10/30/22 0546 10/30/22 1900 10/31/22 0541  NA 137 133* 135  K 5.1 5.0 4.6  CL 100 100 102  CO2 25 15* 19*  GLUCOSE 89 193* 130*  BUN 44* 55* 61*  CREATININE 5.88* 6.72* 7.24*  CALCIUM 7.4* 7.4* 7.5*  PHOS 5.6*  --   --    Recent Labs  Lab 10/28/22 2129 10/29/22 1931 10/30/22 0546 10/30/22 1900 10/31/22 0541  WBC 6.2   < > 6.2 8.4 8.1  NEUTROABS 4.6  --   --   --   --   HGB 7.1*   < > 10.6* 10.9* 10.1*  HCT 22.1*   < > 32.5* 34.4* 30.8*  MCV 120.8*   < > 105.9* 110.3* 104.4*  PLT 215   < > 192 188 191   < > = values in this  interval not displayed.

## 2022-11-01 NOTE — Anesthesia Preprocedure Evaluation (Signed)
Anesthesia Evaluation  Patient identified by MRN, date of birth, ID band Patient awake    Reviewed: Allergy & Precautions, H&P , NPO status , Patient's Chart, lab work & pertinent test results  Airway Mallampati: III  TM Distance: >3 FB Neck ROM: Full    Dental  (+) Missing, Dental Advisory Given   Pulmonary shortness of breath and with exertion, pneumonia   Pulmonary exam normal breath sounds clear to auscultation       Cardiovascular Exercise Tolerance: Good hypertension, Pt. on medications + CAD, + Past MI and +CHF  Normal cardiovascular exam Rhythm:Regular Rate:Normal  1. Left ventricular ejection fraction, by estimation, is 30 to 35%. The  left ventricle has moderately decreased function. The left ventricle  demonstrates regional wall motion abnormalities (see scoring  diagram/findings for description). There is mild  left ventricular hypertrophy. Left ventricular diastolic parameters are  consistent with Grade II diastolic dysfunction (pseudonormalization).  Elevated left atrial pressure.   2. Right ventricular systolic function is moderately reduced. The right  ventricular size is normal. There is mildly elevated pulmonary artery  systolic pressure. The estimated right ventricular systolic pressure is  43.4 mmHg.   3. Left atrial size was severely dilated.   4. Right atrial size was mildly dilated.   5. The mitral valve is degenerative. Moderate mitral valve regurgitation.  Mild to moderate mitral stenosis. MG at 70bpm, MVA 1.2 cm^2 by  continuity equation. Moderate mitral annular calcification.   6. Tricuspid valve regurgitation is mild to moderate.   7. The aortic valve is tricuspid. Aortic valve regurgitation is not  visualized. Aortic valve sclerosis/calcification is present, without any  evidence of aortic stenosis.   8. The inferior vena cava is normal in size with greater than 50%  respiratory variability,  suggesting right atrial pressure of 3 mmHg.      Neuro/Psych  PSYCHIATRIC DISORDERS Anxiety     TIA Neuromuscular disease    GI/Hepatic Neg liver ROS, PUD,GERD  Medicated and Controlled,,Upper GI bleeding   Endo/Other  diabetes, Well Controlled, Type 2, Oral Hypoglycemic AgentsHypothyroidism    Renal/GU ESRF and DialysisRenal disease  negative genitourinary   Musculoskeletal  (+) Arthritis , Osteoarthritis,    Abdominal   Peds negative pediatric ROS (+)  Hematology  (+) Blood dyscrasia, anemia   Anesthesia Other Findings   Reproductive/Obstetrics negative OB ROS                             Anesthesia Physical Anesthesia Plan  ASA: 4  Anesthesia Plan: General   Post-op Pain Management: Minimal or no pain anticipated   Induction: Intravenous  PONV Risk Score and Plan: Propofol infusion  Airway Management Planned: Nasal Cannula and Natural Airway  Additional Equipment:   Intra-op Plan:   Post-operative Plan:   Informed Consent: I have reviewed the patients History and Physical, chart, labs and discussed the procedure including the risks, benefits and alternatives for the proposed anesthesia with the patient or authorized representative who has indicated his/her understanding and acceptance.     Dental advisory given  Plan Discussed with: CRNA and Surgeon  Anesthesia Plan Comments:         Anesthesia Quick Evaluation

## 2022-11-01 NOTE — Progress Notes (Signed)
PROGRESS NOTE  Shawna [name]:[id number] DOB: 1940/11/25 DOA: 10/28/2022 PCP: [facility]  Brief History:  83 year old female with a history of ESRD (MWF), coronary artery disease status post CABG, CHF, hypertension, hyperlipidemia, paroxysmal atrial fibrillation, hyperlipidemia, GI bleed presenting with generalized weakness and not feeling well after dialysis. At the time of admission, the patient was noted to have a hemoglobin of 7.1.  She was transfused 2 units PRBC.  GI was consulted to assist with management. Unfortunately, the patient had a rapid response called on the evening of 10/30/2022 secondary to altered mental status.  Apparently, the patient was noted to have seizure-like activity by nursing staff.  Apparently the patient was lethargic minimally responsive to painful stimuli.  There were shaking activity lasting 3 minutes noted by nursing staff.  Code stroke was activated.  CT of the brain was negative for hemorrhage.  There was some low-density subdural hygromas.  The patient was seen by teleneurology.  CTA of the head and neck was recommended, but family refused secondary to the patient's contrast allergy.  The patient was loaded with Keppra.  EEG was ordered.  They felt that the patient could stay at [facility] as long as she improves and follows commands.  She returns her to the ICU.  As the evening progressed, the patient was more responsive.  Apparently, the patient was refusing to follow commands but would speak when probed and speech was clear.  The patient did follow some simple commands.  The patient had recent hospitalization from 08/26/22 to 08/29/22 secondary to acute on chronic blood loss anemia.  Hemoccult was positive at that time.  The patmultiple gastric AVMs treated with APC, small hiatal hernia, no active or stigmata of bleeding.  Capsule was placed placed during EGD and showed few small AVMs in the small bowel, likely jejunum.  2 small polyps in  the distal small bowel previously noted. Capsule appears to sit at the ileocolonic anastomosis for the last 2 to 3 hours of study.  She was transfused 2 units PRBC during that hospital admission.  She is not felt to be a candidate for anticoagulation due to her recurrent GI bleeds.  The patient was noted to have hospital delirium and felt to have a degree of underlying cognitive impairment.  She also had a previous hospital admission from 08/06/2022 to 08/17/2022 due to acute on chronic encephalopathy during the hospitalization.  At best, it appears that the patient was oriented only to person at that time.  Family has noted that the patient has had confusion at home since wintertime 2023.  Once again, she had acute on chronic anemia.  She underwent EGD and colonoscopy on 08/10/2022.duodenal and colonic polyps which were removed. 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  She was seen by palliative medicine and full scope of care was continued.   Assessment/Plan:  Acute on chronic blood loss anemia -GI consult appreciated -Transfused 1 unit PRBC this admission -Presented with hemoglobin 7.1 -Hemoglobin remained stable since transfusion -GI planning for possible push enteroscopy once stabilized -Blood loss felt to be secondary to the patient's gastric and small bowel AVMs -4/23 SB enteroscopy--nonbleeding gastric ulcer with clean base; non bleeding jejunal diverticula; no AVMs -Continue octreotide   Myoclonus/Seizure like activity -The patient has had similar episodes of what was described on the evening of 10/30/2022 in the past previously felt to be secondary to anemia and gabapentin in the setting of ESRD patient -Appreciate neurology consult>>loaded  keppra -Continue Keppra -10/31/22 AM-pt is lucid, A&Ox2, follows commands -11/01/22--pt remains lucid   ESRD -Dialyzes Monday, Wednesday, Friday -Next dialysis 10/31/2022 -Appreciate nephrology follow-up   Paroxysmal Atrial fibrillation/SVT -Not a candidate for  anticoagulation secondary to recurrent GI bleed -Continue amiodarone and metoprolol  -currently in sinus   Essential hypertension -Continue home medications-metoprolol and lisinopril -Well-documented that the patient has had uncontrolled hypertension   Coronary Artery Disease s/p CABG in 2013 with DES to D1 in 10/2016 with cath showing patent LIMA-LAD, SVG-RI, SVG-Mrg and SVG-RCA and repeat cath in 07/2018 showing patent grafts with occlusion of D1 at prior stent site and progression of PDA disease and medical management was recommended -continue imdur, ASA 81 mg, simavastatin   Hypothyroidism -Continue Synthroid   Cognitive impairment -Documented over the last several hospitalization the patient has had baseline cognitive impairment and hospital delirium -She is at high risk for recurrent delirium due to her metabolic derangement and hospital stays -10/30/2022 CT brain negative             Family Communication:  spouse updated 4/23   Consultants:  GI, neurology, renal   Code Status:  FULL /   DVT Prophylaxis:  SCDs     Procedures: As Listed in Progress Note Above   Antibiotics: None          Subjective: Patient denies fevers, chills, headache, chest pain, dyspnea, nausea, vomiting, diarrhea, abdominal pain, dysuria, hematuria, hematochezia, and melena.   Objective: Vitals:   11/01/22 1315 11/01/22 1656 11/01/22 1700 11/01/22 1715  BP: (!) 121/38 (!) 104/32 119/60 (!) 112/58  Pulse: (!) 50 (!) 51 (!) 50 (!) 53  Resp: 18 (!) 23 19 20   Temp: 97.9 F (36.6 C) (!) 97.5 F (36.4 C) (!) 97.5 F (36.4 C) 97.6 F (36.4 C)  TempSrc:      SpO2: 99% 98% 98% 99%  Weight:      Height:        Intake/Output Summary (Last 24 hours) at 11/01/2022 1735 Last data filed at 11/01/2022 1700 Gross per 24 hour  Intake 1060 ml  Output 0 ml  Net 1060 ml   Weight change: 0 kg Exam:  General:  Pt is alert, follows commands appropriately, not in acute distress HEENT: No  icterus, No thrush, No neck mass, [place]/AT Cardiovascular: RRR, S1/S2, no rubs, no gallops Respiratory: CTA bilaterally, no wheezing, no crackles, no rhonchi Abdomen: Soft/+BS, non tender, non distended, no guarding Extremities: No edema, No lymphangitis, No petechiae, No rashes, no synovitis   Data Reviewed: I have personally reviewed following labs and imaging studies Basic Metabolic Panel: Recent Labs  Lab 10/28/22 2129 10/30/22 0546 10/30/22 1900 10/31/22 0541 11/01/22 1055  NA 136 137 133* 135 132*  K 3.5 5.1 5.0 4.6 3.9  CL 99 100 100 102 97*  CO2 25 25 15* 19* 25  GLUCOSE 136* 89 193* 130* 87  BUN 23 44* 55* 61* 30*  CREATININE 3.63* 5.88* 6.72* 7.24* 5.05*  CALCIUM 8.1* 7.4* 7.4* 7.5* 7.2*  MG  --   --  1.8  --   --   PHOS  --  5.6*  --   --   --    Liver Function Tests: Recent Labs  Lab 10/30/22 0546 10/30/22 1900  AST  --  15  ALT  --  13  ALKPHOS  --  103  BILITOT  --  1.0  PROT  --  6.6  ALBUMIN 3.3* 3.4*   No results for input(s): "  LIPASE", "AMYLASE" in the last 168 hours. No results for input(s): "AMMONIA" in the last 168 hours. Coagulation Profile: No results for input(s): "INR", "PROTIME" in the last 168 hours. CBC: Recent Labs  Lab 10/28/22 2129 10/29/22 1931 10/30/22 0546 10/30/22 1900 10/31/22 0541  WBC 6.2 5.6 6.2 8.4 8.1  NEUTROABS 4.6  --   --   --   --   HGB 7.1* 9.9* 10.6* 10.9* 10.1*  HCT 22.1* 28.9* 32.5* 34.4* 30.8*  MCV 120.8* 102.5* 105.9* 110.3* 104.4*  PLT 215 188 192 188 191   Cardiac Enzymes: No results for input(s): "CKTOTAL", "CKMB", "CKMBINDEX", "TROPONINI" in the last 168 hours. BNP: Invalid input(s): "POCBNP" CBG: Recent Labs  Lab 10/30/22 1811 11/01/22 1704  GLUCAP 126* 67*   HbA1C: No results for input(s): "HGBA1C" in the last 72 hours. Urine analysis:    Component Value Date/Time   COLORURINE YELLOW 12/16/2012 1919   APPEARANCEUR CLOUDY (A) 12/16/2012 1919   LABSPEC 1.009 12/16/2012 1919   PHURINE 7.5  12/16/2012 1919   GLUCOSEU NEGATIVE 12/16/2012 1919   HGBUR TRACE (A) 12/16/2012 1919   BILIRUBINUR NEGATIVE 12/16/2012 1919   KETONESUR NEGATIVE 12/16/2012 1919   PROTEINUR 100 (A) 12/16/2012 1919   UROBILINOGEN 0.2 12/16/2012 1919   NITRITE NEGATIVE 12/16/2012 1919   LEUKOCYTESUR SMALL (A) 12/16/2012 1919   Sepsis Labs: (procalcitonin:4,lacticidven:4) ) Recent Results (from the past 240 hour(s))  Resp panel by RT-PCR (RSV, Flu A&B, Covid) Anterior Nasal Swab     Status: None   Collection Time: 10/23/22 10:34 AM   Specimen: Anterior Nasal Swab  Result Value Ref Range Status   SARS Coronavirus 2 by RT PCR NEGATIVE NEGATIVE Final    Comment: (NOTE) SARS-CoV-2 target nucleic acids are NOT DETECTED.  The SARS-CoV-2 RNA is generally detectable in upper respiratory specimens during the acute phase of infection. The lowest concentration of SARS-CoV-2 viral copies this assay can detect is 138 copies/mL. A negative result does not preclude SARS-Cov-2 infection and should not be used as the sole basis for treatment or other patient management decisions. A negative result may occur with  improper specimen collection/handling, submission of specimen other than nasopharyngeal swab, presence of viral mutation(s) within the areas targeted by this assay, and inadequate number of viral copies(<138 copies/mL). A negative result must be combined with clinical observations, patient history, and epidemiological information. The expected result is Negative.  Fact Sheet for Patients:  BloggerCourse.com  Fact Sheet for Healthcare Providers:  SeriousBroker.it  This test is no t yet approved or cleared by the [place] FDA and  has been authorized for detection and/or diagnosis of SARS-CoV-2 by FDA under an Emergency Use Authorization (EUA). This EUA will remain  in effect (meaning this test can be used) for the duration of  the COVID-19 declaration under Section 564(b)(1) of the Act, 21 U.S.C.section 360bbb-3(b)(1), unless the authorization is terminated  or revoked sooner.       Influenza A by PCR NEGATIVE NEGATIVE Final   Influenza B by PCR NEGATIVE NEGATIVE Final    Comment: (NOTE) The Xpert Xpress SARS-CoV-2/FLU/RSV plus assay is intended as an aid in the diagnosis of influenza from Nasopharyngeal swab specimens and should not be used as a sole basis for treatment. Nasal washings and aspirates are unacceptable for Xpert Xpress SARS-CoV-2/FLU/RSV testing.  Fact Sheet for Patients: BloggerCourse.com  Fact Sheet for Healthcare Providers: SeriousBroker.it  This test is not yet approved or cleared by the [place] FDA and has been authorized for detection and/or  diagnosis of SARS-CoV-2 by FDA under an Emergency Use Authorization (EUA). This EUA will remain in effect (meaning this test can be used) for the duration of the COVID-19 declaration under Section 564(b)(1) of the Act, 21 U.S.C. section 360bbb-3(b)(1), unless the authorization is terminated or revoked.     Resp Syncytial Virus by PCR NEGATIVE NEGATIVE Final    Comment: (NOTE) Fact Sheet for Patients: BloggerCourse.com  Fact Sheet for Healthcare Providers: SeriousBroker.it  This test is not yet approved or cleared by the [place] FDA and has been authorized for detection and/or diagnosis of SARS-CoV-2 by FDA under an Emergency Use Authorization (EUA). This EUA will remain in effect (meaning this test can be used) for the duration of the COVID-19 declaration under Section 564(b)(1) of the Act, 21 U.S.C. section 360bbb-3(b)(1), unless the authorization is terminated or revoked.  Performed at [facility], [address], [place], [place] [address]   MRSA Next Gen by PCR, Nasal     Status: None   Collection Time: 10/30/22  6:52  PM   Specimen: Nasal Mucosa; Nasal Swab  Result Value Ref Range Status   MRSA by PCR Next Gen NOT DETECTED NOT DETECTED Final    Comment: (NOTE) The GeneXpert MRSA Assay (FDA approved for NASAL specimens only), is one component of a comprehensive MRSA colonization surveillance program. It is not intended to diagnose MRSA infection nor to guide or monitor treatment for MRSA infections. Test performance is not FDA approved in patients less than 72 years old. Performed at [facility], [address] [place], [place] [address]      Scheduled Meds:  [MAR Hold] amiodarone  100 mg Oral Daily   [MAR Hold] calcitRIOL  1.75 mcg Oral Q M,W,F-HD   [MAR Hold] Chlorhexidine Gluconate Cloth  6 each Topical Daily   [MAR Hold] Chlorhexidine Gluconate Cloth  6 each Topical Q0600   [MAR Hold] cinacalcet  30 mg Oral Q M,W,F-HD   [MAR Hold] levETIRAcetam  500 mg Oral Daily   [MAR Hold] levothyroxine  75 mcg Oral QAC breakfast   [MAR Hold] lisinopril  20 mg Oral Daily   [MAR Hold] metoprolol tartrate  12.5 mg Oral BID   [MAR Hold] octreotide  100 mcg Subcutaneous Q12H   [MAR Hold] rosuvastatin  10 mg Oral Daily   Continuous Infusions:  sodium chloride     sodium chloride 20 mL/hr at 11/01/22 1407   sodium chloride 10 mL/hr at 11/01/22 1408   [MAR Hold] anticoagulant sodium citrate      Procedures/Studies: MR BRAIN WO CONTRAST  Result Date: 10/31/2022 CLINICAL DATA:  Altered mental status EXAM: MRI HEAD WITHOUT CONTRAST TECHNIQUE: Multiplanar, multiecho pulse sequences of the brain and surrounding structures were obtained without intravenous contrast. COMPARISON:  04/23/2022 MRI, correlation is also made with 10/30/2022 CT head FINDINGS: Brain: No restricted diffusion to suggest acute or subacute infarct. No acute hemorrhage, mass, or midline shift. No hydrocephalus. Redemonstrated left greater than right subdural hygroma, which measures up to 4 mm on the left and 1-2 mm on the right, as seen on the  prior CT but new from the prior MRI. Normal pituitary and craniocervical junction. Remote lacunar infarcts in the bilateral basal ganglia and left cerebellum. Punctate foci of hemosiderin deposition in the right cerebellum, right campus, and right frontal and parietal lobes, possibly sequela of prior microhemorrhage. No superficial siderosis. Confluent T2 hyperintense signal in the periventricular white matter, likely the sequela of moderate chronic small vessel ischemic disease. Vascular: Normal arterial flow  voids. Skull and upper cervical spine: Normal marrow signal. Sinuses/Orbits: Mucosal thickening in the left sphenoid sinus. No acute finding in the orbits. Other: Trace fluid in the mastoid air cells. IMPRESSION: 1. No acute intracranial process. No evidence of acute or subacute infarct. 2. 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 Electronically Signed   By: Wiliam Ke M.D.   On: 10/31/2022 19:31   EEG adult  Result Date: 10/31/2022 Charlsie Quest, MD     10/31/2022  2:57 PM Patient Name: SHERYLE VICE MRN: [id number] Epilepsy Attending: Charlsie Quest Referring Physician/Provider: Rejeana Brock, MD Date:10/31/2022 Duration: 22.39 mins Patient history: 82yo F getting eeg to evaluate for seizure Level of alertness: Awake, asleep AEDs during EEG study: LEV Technical aspects: This EEG study was done with scalp electrodes positioned according to the 10-20 International system of electrode placement. Electrical activity was reviewed with band pass filter of 1-70Hz , sensitivity of 7 uV/mm, display speed of 23mm/sec with a 60Hz  notched filter applied as appropriate. EEG data were recorded continuously and digitally stored.  Video monitoring was available and reviewed as appropriate. Description: The posterior dominant rhythm consists of 8-9 Hz activity of moderate voltage (25-35 uV) seen predominantly in posterior head regions,  symmetric and reactive to eye opening and eye closing. Sleep was characterized by vertex waves, sleep spindles (12 to 14 Hz), maximal frontocentral region.  EEG showed intermittent generalized 3 to 6 Hz theta-delta slowing. Hyperventilation and photic stimulation were not performed.   ABNORMALITY - Intermittent slow, generalized IMPRESSION: This study is suggestive of mild diffuse encephalopathy. No seizures or epileptiform discharges were seen throughout the recording. Charlsie Quest   DG Abd 1 View  Result Date: 10/31/2022 CLINICAL DATA:  Abdominal pain EXAM: ABDOMEN - 1 VIEW COMPARISON:  08/29/2022 FINDINGS: Bowel gas pattern is nonspecific. Gas and stool are noted in colon. Small amount of stool is seen in colon. There are surgical staples and clips in lower abdomen and pelvis. There is no identifiable electronic device in abdomen and pelvis in the current study. In the previous examination, an electronic device, capsule most noted in region of rectum suggesting interval passage. Arterial calcifications are seen. IMPRESSION: Nonspecific bowel gas pattern. There is no evidence of any metallic electronic capsule in the abdomen and pelvis. Electronically Signed   By: Ernie Avena M.D.   On: 10/31/2022 08:40   CT HEAD CODE STROKE WO CONTRAST  Result Date: 10/30/2022 CLINICAL DATA:  Code stroke.  Seizure, altered mental status. EXAM: CT HEAD WITHOUT CONTRAST TECHNIQUE: Contiguous axial images were obtained from the base of the skull through the vertex without intravenous contrast. RADIATION DOSE REDUCTION: This exam was performed according to the departmental dose-optimization program which includes automated exposure control, adjustment of the mA and/or kV according to patient size and/or use of iterative reconstruction technique. COMPARISON:  CT head 08/26/2022. FINDINGS: Brain: There is no evidence of acute territorial infarct. There are low-density subdural collections overlying both cerebral  hemispheres measuring up to 6 mm on the left and 3 mm on the right without evidence of acute blood products, new since the prior head CT from 08/26/2022. There is no mass effect on the underlying brain parenchyma. Parenchymal volume loss with prominence of the ventricular system and extra-axial CSF spaces is stable. The ventricles are unchanged in size and configuration. A remote infarct in the left basal ganglia is unchanged. Additional confluent hypodensity in the supratentorial  white matter likely reflects sequela of underlying chronic small-vessel ischemic change. The pituitary and suprasellar region are normal. There is no mass lesion. There is no midline shift. Vascular: There is calcification of the bilateral carotid siphons and vertebral arteries. Skull: Normal. Negative for fracture or focal lesion. Sinuses/Orbits: The paranasal sinuses are clear. The globes and orbits are unremarkable. Other: None. ASPECTS [facility] Stroke Program Early CT Score) - Ganglionic level infarction (caudate, lentiform nuclei, internal capsule, insula, M1-M3 cortex): 7 - Supraganglionic infarction (M4-M6 cortex): 3 Total score (0-10 with 10 being normal): 10 IMPRESSION: 1. No evidence of acute intracranial hemorrhage or acute territorial infarct. 2. Low-density subdural hygromas overlying both cerebral hemispheres measuring up to 6 mm on the left and 3 mm on the right, new since the ct head from 08/26/2022 but without evidence of acute blood within the collections. These results communicated via Amion at the time of interpretation on 10/30/2022 at 6:44 pm to provider [facility] , who verbally acknowledged these results. Electronically Signed   By: Lesia Hausen M.D.   On: 10/30/2022 18:46   DG Chest Port 1 View  Result Date: 10/28/2022 CLINICAL DATA:  Cough EXAM: PORTABLE CHEST 1 VIEW COMPARISON:  10/23/2022 FINDINGS: Prior CABG. Left dialysis catheter remains in place with the tip in the right atrium. Mild cardiomegaly. No  overt edema. Bibasilar atelectasis. No effusions or acute bony abnormality. IMPRESSION: Cardiomegaly, bibasilar atelectasis. Electronically Signed   By: Charlett Nose M.D.   On: 10/28/2022 22:15   CT Chest Wo Contrast  Result Date: 10/23/2022 CLINICAL DATA:  Pneumonia, complication suspected, xray done EXAM: CT CHEST WITHOUT CONTRAST TECHNIQUE: Multidetector CT imaging of the chest was performed following the standard protocol without IV contrast. RADIATION DOSE REDUCTION: This exam was performed according to the departmental dose-optimization program which includes automated exposure control, adjustment of the mA and/or kV according to patient size and/or use of iterative reconstruction technique. COMPARISON:  Radiograph earlier today. Additional prior radiographs reviewed. Most recent chest CT available 07/16/2021 FINDINGS: Cardiovascular: Left-sided dialysis catheter tip is in the atrial caval junction. Mild cardiomegaly. Post CABG with calcification of native coronary arteries. Decreased density of the blood pool typical of anemia. Advanced aortic atherosclerosis with aortic tortuosity. No aneurysm or periaortic stranding. No pericardial effusion. Mediastinum/Nodes: 14 mm lower anterior paratracheal node, similar to 2023 exam. Scattered additional shoddy mediastinal lymph nodes are also unchanged. No progressive adenopathy. Patulous esophagus. Lungs/Pleura: Small bilateral pleural effusions, left slightly larger than right. Slight vascular congestion. Minimal fluid in the fissures. Mild patchy ground-glass opacity in the dependent left greater than right lower lobe. Moderate central bronchial thickening which may be congestive or bronchitic. no endobronchial debris. No pulmonary mass. Upper Abdomen: Sequela of chronic renal disease with renal atrophy and cysts. No specific imaging follow-up of these cysts is recommended. A peripherally calcified density in the right upper kidney is stable from 2023 exam and  consistent with a complex cyst, also needing no further follow-up. Left colonic diverticulosis. Musculoskeletal: Exaggerated thoracic kyphosis with degenerative change throughout the thoracic spine. Chronic mild L1 compression deformity. Slight increase in Schmorl's node inferior endplate of T11. Prior median sternotomy. There are no acute or suspicious osseous abnormalities. IMPRESSION: 1. Small bilateral pleural effusions, left slightly larger than right. Slight vascular congestion. 2. Mild patchy ground-glass opacity in the dependent left greater than right lower lobe may be atelectasis or pneumonia. 3. Moderate central bronchial thickening may be congestive or bronchitic. Aortic Atherosclerosis (ICD10-I70.0). Electronically Signed   By: Shawna Orleans  Sanford M.D.   On: 10/23/2022 15:35   DG Chest Port 1 View  Result Date: 10/23/2022 CLINICAL DATA:  82 year old female with cough. Productive cough for 2 weeks. Some shortness of breath. EXAM: PORTABLE CHEST 1 VIEW COMPARISON:  Chest radiographs 10/02/2022 and earlier. FINDINGS: Portable AP upright view at 1107 hours. Prior CABG. Stable cardiomegaly and mediastinal contours. Calcified aortic atherosclerosis. Stable left chest dual lumen dialysis type catheter. Patchy, nonspecific increased left lung base opacity partially obscuring the diaphragm now. No superimposed pneumothorax or consolidation. Stable pulmonary vascularity without overt edema. No confluent right lung opacity. Stable visualized osseous structures.  Negative visible bowel gas. IMPRESSION: 1. Patchy, nonspecific increased left lung base opacity new since last month. PA and lateral views may be helpful if feasible. 2. Otherwise stable cardiomegaly, pulmonary vascularity, prior CABG, chest dialysis catheter, Aortic Atherosclerosis. Electronically Signed   By: Odessa Fleming M.D.   On: 10/23/2022 11:16   PERIPHERAL VASCULAR CATHETERIZATION  Result Date: 10/04/2022 See surgical note for result.  DG Chest 2  View  Result Date: 10/02/2022 CLINICAL DATA:  cough EXAM: CHEST - 2 VIEW COMPARISON:  Chest x-ray 08/26/2022, CT chest 07/16/2021 FINDINGS: Left chest wall dialysis catheter with tip overlying the right atrium. The heart and mediastinal contours are unchanged. Atherosclerotic plaque. Coronary artery calcification. No focal consolidation. Coarsened interstitial markings consistent with known bronchitic changes. No pleural effusion. No pneumothorax. No acute osseous abnormality.  Sternotomy wires are intact. IMPRESSION: 1. Bronchitic changes. 2.  Aortic Atherosclerosis (ICD10-I70.0). Electronically Signed   By: Tish Frederickson M.D.   On: 10/02/2022 19:08    [name], DO  [facility]  If 7PM-7AM, please contact night-coverage [email or web address] Password [facility] 11/01/2022, 5:35 PM   LOS: 3 days

## 2022-11-01 NOTE — Transfer of Care (Signed)
Immediate Anesthesia Transfer of Care Note  Patient: Shawna [name]  Procedure(s) Performed: ENTEROSCOPY BIOPSY SUBMUCOSAL TATTOO INJECTION  Patient Location: PACU  Anesthesia Type:General  Level of Consciousness: sedated  Airway & Oxygen Therapy: Patient Spontanous Breathing  Post-op Assessment: Report given to RN and Post -op Vital signs reviewed and stable  Post vital signs: Reviewed and stable  Last Vitals:  Vitals Value Taken Time  BP 104/32 11/01/22 1656  Temp 36.4 C 11/01/22 1656  Pulse 50 11/01/22 1657  Resp 24 11/01/22 1657  SpO2 97 % 11/01/22 1657  Vitals shown include unvalidated device data.  Last Pain:  Vitals:   11/01/22 1628  TempSrc:   PainSc: 0-No pain      Patients Stated Pain Goal: 0 (10/29/22 0213)  Complications: No notable events documented.

## 2022-11-02 ENCOUNTER — Telehealth (INDEPENDENT_AMBULATORY_CARE_PROVIDER_SITE_OTHER): Payer: Self-pay | Admitting: Gastroenterology

## 2022-11-02 ENCOUNTER — Inpatient Hospital Stay (HOSPITAL_COMMUNITY): Payer: Medicare Other

## 2022-11-02 DIAGNOSIS — R531 Weakness: Secondary | ICD-10-CM | POA: Diagnosis not present

## 2022-11-02 DIAGNOSIS — G9341 Metabolic encephalopathy: Secondary | ICD-10-CM | POA: Diagnosis not present

## 2022-11-02 DIAGNOSIS — R1319 Other dysphagia: Secondary | ICD-10-CM

## 2022-11-02 DIAGNOSIS — D649 Anemia, unspecified: Secondary | ICD-10-CM | POA: Diagnosis not present

## 2022-11-02 DIAGNOSIS — N186 End stage renal disease: Secondary | ICD-10-CM | POA: Diagnosis not present

## 2022-11-02 DIAGNOSIS — R1013 Epigastric pain: Secondary | ICD-10-CM | POA: Diagnosis not present

## 2022-11-02 DIAGNOSIS — R5383 Other fatigue: Secondary | ICD-10-CM

## 2022-11-02 DIAGNOSIS — D62 Acute posthemorrhagic anemia: Secondary | ICD-10-CM | POA: Diagnosis not present

## 2022-11-02 LAB — HEMOGLOBIN AND HEMATOCRIT, BLOOD
HCT: 30.3 % — ABNORMAL LOW (ref 36.0–46.0)
Hemoglobin: 10.3 g/dL — ABNORMAL LOW (ref 12.0–15.0)

## 2022-11-02 LAB — CULTURE, BLOOD (ROUTINE X 2): Special Requests: ADEQUATE

## 2022-11-02 LAB — RENAL FUNCTION PANEL
Albumin: 2.9 g/dL — ABNORMAL LOW (ref 3.5–5.0)
Anion gap: 12 (ref 5–15)
BUN: 34 mg/dL — ABNORMAL HIGH (ref 8–23)
CO2: 21 mmol/L — ABNORMAL LOW (ref 22–32)
Calcium: 7.2 mg/dL — ABNORMAL LOW (ref 8.9–10.3)
Chloride: 98 mmol/L (ref 98–111)
Creatinine, Ser: 5.97 mg/dL — ABNORMAL HIGH (ref 0.44–1.00)
GFR, Estimated: 7 mL/min — ABNORMAL LOW (ref >60)
Glucose, Bld: 103 mg/dL — ABNORMAL HIGH (ref 70–99)
Phosphorus: 5.3 mg/dL — ABNORMAL HIGH (ref 2.5–4.6)
Potassium: 3.9 mmol/L (ref 3.5–5.1)
Sodium: 131 mmol/L — ABNORMAL LOW (ref 135–145)

## 2022-11-02 LAB — CBC
HCT: 28.2 % — ABNORMAL LOW (ref 36.0–46.0)
Hemoglobin: 9.4 g/dL — ABNORMAL LOW (ref 12.0–15.0)
MCH: 35.2 pg — ABNORMAL HIGH (ref 26.0–34.0)
MCHC: 33.3 g/dL (ref 30.0–36.0)
MCV: 105.6 fL — ABNORMAL HIGH (ref 80.0–100.0)
Platelets: 151 10*3/uL (ref 150–400)
RBC: 2.67 MIL/uL — ABNORMAL LOW (ref 3.87–5.11)
RDW: 26.3 % — ABNORMAL HIGH (ref 11.5–15.5)
WBC: 9.6 10*3/uL (ref 4.0–10.5)
nRBC: 0 % (ref 0.0–0.2)

## 2022-11-02 LAB — GLUCOSE, CAPILLARY: Glucose-Capillary: 137 mg/dL — ABNORMAL HIGH (ref 70–99)

## 2022-11-02 MED ORDER — PIPERACILLIN-TAZOBACTAM IN DEX 2-0.25 GM/50ML IV SOLN
2.2500 g | Freq: Three times a day (TID) | INTRAVENOUS | Status: DC
  Administered 2022-11-02 – 2022-11-03 (×2): 2.25 g via INTRAVENOUS
  Filled 2022-11-02 (×6): qty 50

## 2022-11-02 MED ORDER — MAGIC MOUTHWASH
5.0000 mL | Freq: Three times a day (TID) | ORAL | Status: DC
  Administered 2022-11-02 – 2022-11-15 (×33): 5 mL via ORAL
  Filled 2022-11-02 (×45): qty 5

## 2022-11-02 NOTE — Progress Notes (Signed)
PROGRESS NOTE  Shawna [name] ZOX:[id number] DOB: 05/14/1941 DOA: 10/28/2022 PCP: [facility]  Brief History:  82 year old female with a history of ESRD (MWF), coronary artery disease status post CABG, CHF, hypertension, hyperlipidemia, paroxysmal atrial fibrillation, hyperlipidemia, GI bleed presenting with generalized weakness and not feeling well after dialysis. At the time of admission, the patient was noted to have a hemoglobin of 7.1.  She was transfused 2 units PRBC.  GI was consulted to assist with management. Unfortunately, the patient had a rapid response called on the evening of 10/30/2022 secondary to altered mental status.  Apparently, the patient was noted to have seizure-like activity by nursing staff.  Apparently the patient was lethargic minimally responsive to painful stimuli.  There were shaking activity lasting 3 minutes noted by nursing staff.  Code stroke was activated.  CT of the brain was negative for hemorrhage.  There was some low-density subdural hygromas.  The patient was seen by teleneurology.  CTA of the head and neck was recommended, but family refused secondary to the patient's contrast allergy.  The patient was loaded with Keppra.  EEG was ordered.  They felt that the patient could stay at [facility] as long as she improves and follows commands.  She returns her to the ICU.  As the evening progressed, the patient was more responsive.  Apparently, the patient was refusing to follow commands but would speak when probed and speech was clear.  The patient did follow some simple commands.  The patient had recent hospitalization from 08/26/22 to 08/29/22 secondary to acute on chronic blood loss anemia.  Hemoccult was positive at that time.  The patmultiple gastric AVMs treated with APC, small hiatal hernia, no active or stigmata of bleeding.  Capsule was placed placed during EGD and showed few small AVMs in the small bowel, likely jejunum.  2 small polyps in  the distal small bowel previously noted. Capsule appears to sit at the ileocolonic anastomosis for the last 2 to 3 hours of study.  She was transfused 2 units PRBC during that hospital admission.  She is not felt to be a candidate for anticoagulation due to her recurrent GI bleeds.  The patient was noted to have hospital delirium and felt to have a degree of underlying cognitive impairment.  She also had a previous hospital admission from 08/06/2022 to 08/17/2022 due to acute on chronic encephalopathy during the hospitalization.  At best, it appears that the patient was oriented only to person at that time.  Family has noted that the patient has had confusion at home since wintertime 2023.  Once again, she had acute on chronic anemia.  She underwent EGD and colonoscopy on 08/10/2022.duodenal and colonic polyps which were removed. 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  She was seen by palliative medicine and full scope of care was continued.   Assessment/Plan: Acute on chronic blood loss anemia -GI consult appreciated -Transfused 1 unit PRBC this admission -Presented with hemoglobin 7.1 -Hemoglobin remained stable since transfusion -Blood loss felt to be secondary to the patient's gastric and small bowel AVMs -4/23 SB enteroscopy--nonbleeding gastric ulcer with clean base; non bleeding jejunal diverticula; no AVMs -Continue octreotide -Hgb remains largely stable   Myoclonus/Seizure like activity -The patient has had similar episodes of what was described on the evening of 10/30/2022 in the past previously felt to be secondary to anemia and gabapentin in the setting of ESRD patient -Appreciate neurology consult>>loaded keppra -Continue Keppra -10/31/22 AM-pt  is lucid, A&Ox2, follows commands -11/01/22--pt remains lucid -11/02/22--lucid without any seizure like activity  Abdominal Pain -appears to be lower abd -CT abd/pelvis -etiology unclear presently -tolerating diet, but endorses post-prandial pain    ESRD -Dialyzes Monday, Wednesday, Friday -last dialysis 11/02/2022 -Appreciate nephrology follow-up   Paroxysmal Atrial fibrillation/SVT -Not a candidate for anticoagulation secondary to recurrent GI bleed -Continue amiodarone and metoprolol  -currently in sinus, but has intermittent episodes of SVT   Essential hypertension -Continue home medications-metoprolol and lisinopril -Well-documented that the patient has had uncontrolled hypertension   Coronary Artery Disease s/p CABG in 2013 with DES to D1 in 10/2016 with cath showing patent LIMA-LAD, SVG-RI, SVG-Mrg and SVG-RCA and repeat cath in 07/2018 showing patent grafts with occlusion of D1 at prior stent site and progression of PDA disease and medical management was recommended -continue imdur, ASA 81 mg, simavastatin   Hypothyroidism -Continue Synthroid   Cognitive impairment -Documented over the last several hospitalization the patient has had baseline cognitive impairment and hospital delirium -She is at high risk for recurrent delirium due to her metabolic derangement and hospital stays -10/30/2022 CT brain negative             Family Communication:  spouse updated 4/23   Consultants:  GI, neurology, renal   Code Status:  FULL    DVT Prophylaxis:  SCDs     Procedures: As Listed in Progress Note Above   Antibiotics: None        Subjective: Patient complains of abdominal pain, but denies f/c, cp, sob, n/v.  Had BM 4/23, none today  Objective: Vitals:   11/02/22 1555 11/02/22 1600 11/02/22 1615 11/02/22 1630  BP:  (!) 187/46 (!) 195/47 (!) 134/95  Pulse: 76 78 75 78  Resp: 14 12 12 16   Temp:      TempSrc:      SpO2: 100% 100% 100% 100%  Weight:      Height:        Intake/Output Summary (Last 24 hours) at 11/02/2022 1639 Last data filed at 11/02/2022 1300 Gross per 24 hour  Intake 353.67 ml  Output 0 ml  Net 353.67 ml   Weight change: 0.9 kg Exam:  General:  Pt is alert, follows commands  appropriately, not in acute distress HEENT: No icterus, No thrush, No neck mass, [place]/AT Cardiovascular: RRR, S1/S2, no rubs, no gallops Respiratory: CTA bilaterally, no wheezing, no crackles, no rhonchi Abdomen: Soft/+BS, lower abd tender, non distended, no guarding Extremities: No edema, No lymphangitis, No petechiae, No rashes, no synovitis   Data Reviewed: I have personally reviewed following labs and imaging studies Basic Metabolic Panel: Recent Labs  Lab 10/30/22 0546 10/30/22 1900 10/31/22 0541 11/01/22 1055 11/02/22 0537  NA 137 133* 135 132* 131*  K 5.1 5.0 4.6 3.9 3.9  CL 100 100 102 97* 98  CO2 25 15* 19* 25 21*  GLUCOSE 89 193* 130* 87 103*  BUN 44* 55* 61* 30* 34*  CREATININE 5.88* 6.72* 7.24* 5.05* 5.97*  CALCIUM 7.4* 7.4* 7.5* 7.2* 7.2*  MG  --  1.8  --   --   --   PHOS 5.6*  --   --   --  5.3*   Liver Function Tests: Recent Labs  Lab 10/30/22 0546 10/30/22 1900 11/02/22 0537  AST  --  15  --   ALT  --  13  --   ALKPHOS  --  103  --   BILITOT  --  1.0  --  PROT  --  6.6  --   ALBUMIN 3.3* 3.4* 2.9*   No results for input(s): "LIPASE", "AMYLASE" in the last 168 hours. No results for input(s): "AMMONIA" in the last 168 hours. Coagulation Profile: No results for input(s): "INR", "PROTIME" in the last 168 hours. CBC: Recent Labs  Lab 10/28/22 2129 10/29/22 1931 10/30/22 0546 10/30/22 1900 10/31/22 0541 11/02/22 0537  WBC 6.2 5.6 6.2 8.4 8.1 9.6  NEUTROABS 4.6  --   --   --   --   --   HGB 7.1* 9.9* 10.6* 10.9* 10.1* 9.4*  HCT 22.1* 28.9* 32.5* 34.4* 30.8* 28.2*  MCV 120.8* 102.5* 105.9* 110.3* 104.4* 105.6*  PLT 215 188 192 188 191 151   Cardiac Enzymes: No results for input(s): "CKTOTAL", "CKMB", "CKMBINDEX", "TROPONINI" in the last 168 hours. BNP: Invalid input(s): "POCBNP" CBG: Recent Labs  Lab 10/30/22 1811 11/01/22 1704  GLUCAP 126* 67*   HbA1C: No results for input(s): "HGBA1C" in the last 72 hours. Urine analysis:     Component Value Date/Time   COLORURINE YELLOW 12/16/2012 1919   APPEARANCEUR CLOUDY (A) 12/16/2012 1919   LABSPEC 1.009 12/16/2012 1919   PHURINE 7.5 12/16/2012 1919   GLUCOSEU NEGATIVE 12/16/2012 1919   HGBUR TRACE (A) 12/16/2012 1919   BILIRUBINUR NEGATIVE 12/16/2012 1919   KETONESUR NEGATIVE 12/16/2012 1919   PROTEINUR 100 (A) 12/16/2012 1919   UROBILINOGEN 0.2 12/16/2012 1919   NITRITE NEGATIVE 12/16/2012 1919   LEUKOCYTESUR SMALL (A) 12/16/2012 1919   Sepsis Labs: (procalcitonin:4,lacticidven:4) ) Recent Results (from the past 240 hour(s))  MRSA Next Gen by PCR, Nasal     Status: None   Collection Time: 10/30/22  6:52 PM   Specimen: Nasal Mucosa; Nasal Swab  Result Value Ref Range Status   MRSA by PCR Next Gen NOT DETECTED NOT DETECTED Final    Comment: (NOTE) The GeneXpert MRSA Assay (FDA approved for NASAL specimens only), is one component of a comprehensive MRSA colonization surveillance program. It is not intended to diagnose MRSA infection nor to guide or monitor treatment for MRSA infections. Test performance is not FDA approved in patients less than 97 years old. Performed at [facility], [address], [place], [place] [address]      Scheduled Meds:  amiodarone  100 mg Oral Daily   calcitRIOL  1.75 mcg Oral Q M,W,F-HD   Chlorhexidine Gluconate Cloth  6 each Topical Daily   Chlorhexidine Gluconate Cloth  6 each Topical Q0600   cinacalcet  30 mg Oral Q M,W,F-HD   levETIRAcetam  500 mg Oral Daily   levothyroxine  75 mcg Oral QAC breakfast   lisinopril  20 mg Oral Daily   magic mouthwash  5 mL Oral TID   metoprolol tartrate  12.5 mg Oral BID   octreotide  100 mcg Subcutaneous Q12H   rosuvastatin  10 mg Oral Daily   Continuous Infusions:  anticoagulant sodium citrate      Procedures/Studies: MR BRAIN WO CONTRAST  Result Date: 10/31/2022 CLINICAL DATA:  Altered mental status EXAM: MRI HEAD WITHOUT CONTRAST TECHNIQUE: Multiplanar, multiecho  pulse sequences of the brain and surrounding structures were obtained without intravenous contrast. COMPARISON:  04/23/2022 MRI, correlation is also made with 10/30/2022 CT head FINDINGS: Brain: No restricted diffusion to suggest acute or subacute infarct. No acute hemorrhage, mass, or midline shift. No hydrocephalus. Redemonstrated left greater than right subdural hygroma, which measures up to 4 mm on the left and 1-2 mm on the right, as seen on the  prior CT but new from the prior MRI. Normal pituitary and craniocervical junction. Remote lacunar infarcts in the bilateral basal ganglia and left cerebellum. Punctate foci of hemosiderin deposition in the right cerebellum, right campus, and right frontal and parietal lobes, possibly sequela of prior microhemorrhage. No superficial siderosis. Confluent T2 hyperintense signal in the periventricular white matter, likely the sequela of moderate chronic small vessel ischemic disease. Vascular: Normal arterial flow voids. Skull and upper cervical spine: Normal marrow signal. Sinuses/Orbits: Mucosal thickening in the left sphenoid sinus. No acute finding in the orbits. Other: Trace fluid in the mastoid air cells. IMPRESSION: 1. No acute intracranial process. No evidence of acute or subacute infarct. 2. 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 Electronically Signed   By: Wiliam Ke M.D.   On: 10/31/2022 19:31   EEG adult  Result Date: 10/31/2022 Charlsie Quest, MD     10/31/2022  2:57 PM Patient Name: RICKEY FARRIER MRN: [id number] Epilepsy Attending: Charlsie Quest Referring Physician/Provider: Rejeana Brock, MD Date:10/31/2022 Duration: 22.39 mins Patient history: 83yo F getting eeg to evaluate for seizure Level of alertness: Awake, asleep AEDs during EEG study: LEV Technical aspects: This EEG study was done with scalp electrodes positioned according to the 10-20 International system of  electrode placement. Electrical activity was reviewed with band pass filter of 1-70Hz , sensitivity of 7 uV/mm, display speed of 37mm/sec with a 60Hz  notched filter applied as appropriate. EEG data were recorded continuously and digitally stored.  Video monitoring was available and reviewed as appropriate. Description: The posterior dominant rhythm consists of 8-9 Hz activity of moderate voltage (25-35 uV) seen predominantly in posterior head regions, symmetric and reactive to eye opening and eye closing. Sleep was characterized by vertex waves, sleep spindles (12 to 14 Hz), maximal frontocentral region.  EEG showed intermittent generalized 3 to 6 Hz theta-delta slowing. Hyperventilation and photic stimulation were not performed.   ABNORMALITY - Intermittent slow, generalized IMPRESSION: This study is suggestive of mild diffuse encephalopathy. No seizures or epileptiform discharges were seen throughout the recording. Charlsie Quest   DG Abd 1 View  Result Date: 10/31/2022 CLINICAL DATA:  Abdominal pain EXAM: ABDOMEN - 1 VIEW COMPARISON:  08/29/2022 FINDINGS: Bowel gas pattern is nonspecific. Gas and stool are noted in colon. Small amount of stool is seen in colon. There are surgical staples and clips in lower abdomen and pelvis. There is no identifiable electronic device in abdomen and pelvis in the current study. In the previous examination, an electronic device, capsule most noted in region of rectum suggesting interval passage. Arterial calcifications are seen. IMPRESSION: Nonspecific bowel gas pattern. There is no evidence of any metallic electronic capsule in the abdomen and pelvis. Electronically Signed   By: Ernie Avena M.D.   On: 10/31/2022 08:40   CT HEAD CODE STROKE WO CONTRAST  Result Date: 10/30/2022 CLINICAL DATA:  Code stroke.  Seizure, altered mental status. EXAM: CT HEAD WITHOUT CONTRAST TECHNIQUE: Contiguous axial images were obtained from the base of the skull through the vertex  without intravenous contrast. RADIATION DOSE REDUCTION: This exam was performed according to the departmental dose-optimization program which includes automated exposure control, adjustment of the mA and/or kV according to patient size and/or use of iterative reconstruction technique. COMPARISON:  CT head 08/26/2022. FINDINGS: Brain: There is no evidence of acute territorial infarct. There are low-density subdural collections overlying both cerebral hemispheres measuring up to 6 mm  on the left and 3 mm on the right without evidence of acute blood products, new since the prior head CT from 08/26/2022. There is no mass effect on the underlying brain parenchyma. Parenchymal volume loss with prominence of the ventricular system and extra-axial CSF spaces is stable. The ventricles are unchanged in size and configuration. A remote infarct in the left basal ganglia is unchanged. Additional confluent hypodensity in the supratentorial white matter likely reflects sequela of underlying chronic small-vessel ischemic change. The pituitary and suprasellar region are normal. There is no mass lesion. There is no midline shift. Vascular: There is calcification of the bilateral carotid siphons and vertebral arteries. Skull: Normal. Negative for fracture or focal lesion. Sinuses/Orbits: The paranasal sinuses are clear. The globes and orbits are unremarkable. Other: None. ASPECTS [facility] Stroke Program Early CT Score) - Ganglionic level infarction (caudate, lentiform nuclei, internal capsule, insula, M1-M3 cortex): 7 - Supraganglionic infarction (M4-M6 cortex): 3 Total score (0-10 with 10 being normal): 10 IMPRESSION: 1. No evidence of acute intracranial hemorrhage or acute territorial infarct. 2. Low-density subdural hygromas overlying both cerebral hemispheres measuring up to 6 mm on the left and 3 mm on the right, new since the ct head from 08/26/2022 but without evidence of acute blood within the collections. These results  communicated via Amion at the time of interpretation on 10/30/2022 at 6:44 pm to provider [facility] , who verbally acknowledged these results. Electronically Signed   By: Lesia Hausen M.D.   On: 10/30/2022 18:46   DG Chest Port 1 View  Result Date: 10/28/2022 CLINICAL DATA:  Cough EXAM: PORTABLE CHEST 1 VIEW COMPARISON:  10/23/2022 FINDINGS: Prior CABG. Left dialysis catheter remains in place with the tip in the right atrium. Mild cardiomegaly. No overt edema. Bibasilar atelectasis. No effusions or acute bony abnormality. IMPRESSION: Cardiomegaly, bibasilar atelectasis. Electronically Signed   By: Charlett Nose M.D.   On: 10/28/2022 22:15   CT Chest Wo Contrast  Result Date: 10/23/2022 CLINICAL DATA:  Pneumonia, complication suspected, xray done EXAM: CT CHEST WITHOUT CONTRAST TECHNIQUE: Multidetector CT imaging of the chest was performed following the standard protocol without IV contrast. RADIATION DOSE REDUCTION: This exam was performed according to the departmental dose-optimization program which includes automated exposure control, adjustment of the mA and/or kV according to patient size and/or use of iterative reconstruction technique. COMPARISON:  Radiograph earlier today. Additional prior radiographs reviewed. Most recent chest CT available 07/16/2021 FINDINGS: Cardiovascular: Left-sided dialysis catheter tip is in the atrial caval junction. Mild cardiomegaly. Post CABG with calcification of native coronary arteries. Decreased density of the blood pool typical of anemia. Advanced aortic atherosclerosis with aortic tortuosity. No aneurysm or periaortic stranding. No pericardial effusion. Mediastinum/Nodes: 14 mm lower anterior paratracheal node, similar to 2023 exam. Scattered additional shoddy mediastinal lymph nodes are also unchanged. No progressive adenopathy. Patulous esophagus. Lungs/Pleura: Small bilateral pleural effusions, left slightly larger than right. Slight vascular congestion.  Minimal fluid in the fissures. Mild patchy ground-glass opacity in the dependent left greater than right lower lobe. Moderate central bronchial thickening which may be congestive or bronchitic. no endobronchial debris. No pulmonary mass. Upper Abdomen: Sequela of chronic renal disease with renal atrophy and cysts. No specific imaging follow-up of these cysts is recommended. A peripherally calcified density in the right upper kidney is stable from 2023 exam and consistent with a complex cyst, also needing no further follow-up. Left colonic diverticulosis. Musculoskeletal: Exaggerated thoracic kyphosis with degenerative change throughout the thoracic spine. Chronic mild L1 compression deformity. Slight  increase in Schmorl's node inferior endplate of T11. Prior median sternotomy. There are no acute or suspicious osseous abnormalities. IMPRESSION: 1. Small bilateral pleural effusions, left slightly larger than right. Slight vascular congestion. 2. Mild patchy ground-glass opacity in the dependent left greater than right lower lobe may be atelectasis or pneumonia. 3. Moderate central bronchial thickening may be congestive or bronchitic. Aortic Atherosclerosis (ICD10-I70.0). Electronically Signed   By: Narda Rutherford M.D.   On: 10/23/2022 15:35   DG Chest Port 1 View  Result Date: 10/23/2022 CLINICAL DATA:  82 year old female with cough. Productive cough for 2 weeks. Some shortness of breath. EXAM: PORTABLE CHEST 1 VIEW COMPARISON:  Chest radiographs 10/02/2022 and earlier. FINDINGS: Portable AP upright view at 1107 hours. Prior CABG. Stable cardiomegaly and mediastinal contours. Calcified aortic atherosclerosis. Stable left chest dual lumen dialysis type catheter. Patchy, nonspecific increased left lung base opacity partially obscuring the diaphragm now. No superimposed pneumothorax or consolidation. Stable pulmonary vascularity without overt edema. No confluent right lung opacity. Stable visualized osseous  structures.  Negative visible bowel gas. IMPRESSION: 1. Patchy, nonspecific increased left lung base opacity new since last month. PA and lateral views may be helpful if feasible. 2. Otherwise stable cardiomegaly, pulmonary vascularity, prior CABG, chest dialysis catheter, Aortic Atherosclerosis. Electronically Signed   By: Odessa Fleming M.D.   On: 10/23/2022 11:16   PERIPHERAL VASCULAR CATHETERIZATION  Result Date: 10/04/2022 See surgical note for result.   [name], DO  [facility]  If 7PM-7AM, please contact night-coverage [email or web address] Password [facility] 11/02/2022, 4:39 PM   LOS: 4 days

## 2022-11-02 NOTE — Progress Notes (Addendum)
Subjective: Patient reports she is feeling okay this morning. She reports continued abdominal pain though feels worse today than prior. No nausea, vomiting. Last BM yesterday. States she only Ate a little breakfast this morning though entire tray finished per nursing staff. She notes abdominal pain worsened after eating. Notes some odynophagia since procedure yesterday. Difficult historian due to history of dementia.   Objective: Vital signs in last 24 hours: Temp:  [97.5 F (36.4 C)-98.6 F (37 C)] 98.6 F (37 C) (04/24 0807) Pulse Rate:  [46-67] 58 (04/24 0700) Resp:  [13-24] 24 (04/24 0800) BP: (104-177)/(32-84) 158/47 (04/24 0800) SpO2:  [89 %-100 %] 99 % (04/24 0700) Weight:  [57.7 kg] 57.7 kg (04/24 0512) Last BM Date : 10/31/22 General:   Alert, disoriented  Head:  Normocephalic and atraumatic. Eyes:  No icterus, sclera clear. Conjuctiva pink.  Mouth:  Without lesions, mucosa pink and moist.  Heart:  S1, S2 present, no murmurs noted.  Lungs: Clear to auscultation bilaterally, without wheezing, rales, or rhonchi.  Abdomen:  Bowel sounds present, soft, non-distended. TTP of epigastric region. No HSM or hernias noted. No rebound or guarding. No masses appreciated  Msk:  Symmetrical without gross deformities. Normal posture. Pulses:  Normal pulses noted. Extremities:  Without clubbing or edema. Neurologic:  Alert. Pleasant  Skin:  Warm and dry, intact without significant lesions.  Psych:  Alert and cooperative. Normal mood and affect.  Intake/Output from previous day: 04/23 0701 - 04/24 0700 In: 738.7 [I.V.:738.7] Out: 0  Intake/Output this shift: No intake/output data recorded.  Lab Results: Recent Labs    10/30/22 1900 10/31/22 0541 11/02/22 0537  WBC 8.4 8.1 9.6  HGB 10.9* 10.1* 9.4*  HCT 34.4* 30.8* 28.2*  PLT 188 191 151   BMET Recent Labs    10/31/22 0541 11/01/22 1055 11/02/22 0537  NA 135 132* 131*  K 4.6 3.9 3.9  CL 102 97* 98  CO2 19* 25 21*   GLUCOSE 130* 87 103*  BUN 61* 30* 34*  CREATININE 7.24* 5.05* 5.97*  CALCIUM 7.5* 7.2* 7.2*   LFT Recent Labs    10/30/22 1900 11/02/22 0537  PROT 6.6  --   ALBUMIN 3.4* 2.9*  AST 15  --   ALT 13  --   ALKPHOS 103  --   BILITOT 1.0  --    Studies/Results: MR BRAIN WO CONTRAST  Result Date: 10/31/2022 CLINICAL DATA:  Altered mental status EXAM: MRI HEAD WITHOUT CONTRAST TECHNIQUE: Multiplanar, multiecho pulse sequences of the brain and surrounding structures were obtained without intravenous contrast. COMPARISON:  04/23/2022 MRI, correlation is also made with 10/30/2022 CT head FINDINGS: Brain: No restricted diffusion to suggest acute or subacute infarct. No acute hemorrhage, mass, or midline shift. No hydrocephalus. 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 Normal pituitary and craniocervical junction. Remote lacunar infarcts in the bilateral basal ganglia and left cerebellum. Punctate foci of hemosiderin deposition in the right cerebellum, right campus, and right frontal and parietal lobes, possibly sequela of prior microhemorrhage. No superficial siderosis. Confluent T2 hyperintense signal in the periventricular white matter, likely the sequela of moderate chronic small vessel ischemic disease. Vascular: Normal arterial flow voids. Skull and upper cervical spine: Normal marrow signal. Sinuses/Orbits: Mucosal thickening in the left sphenoid sinus. No acute finding in the orbits. Other: Trace fluid in the mastoid air cells. IMPRESSION:  1. No acute intracranial process. No evidence of acute or subacute infarct. 2. 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 Electronically Signed   By: Wiliam Ke M.D.   On: 10/31/2022 19:31   EEG adult  Result Date: 10/31/2022 Shawna Quest, MD     10/31/2022   2:57 PM Patient Name: Shawna [name] MRN: [id number] Epilepsy Attending: Charlsie [name] Referring Physician/Provider: Rejeana Brock, MD Date:10/31/2022 Duration: 22.39 mins Patient history: 82yo F getting eeg to evaluate for seizure Level of alertness: Awake, asleep AEDs during EEG study: LEV Technical aspects: This EEG study was done with scalp electrodes positioned according to the 10-20 International system of electrode placement. Electrical activity was reviewed with band pass filter of 1-70Hz , sensitivity of 7 uV/mm, display speed of 42mm/sec with a  notched filter applied as appropriate. EEG data were recorded continuously and digitally stored.  Video monitoring was available and reviewed as appropriate. Description: The posterior dominant rhythm consists of 8-9 Hz activity of moderate voltage (25-35 uV) seen predominantly in posterior head regions, symmetric and reactive to eye opening and eye closing. Sleep was characterized by vertex waves, sleep spindles (12 to 14 Hz), maximal frontocentral region.  EEG showed intermittent generalized 3 to 6 Hz theta-delta slowing. Hyperventilation and photic stimulation were not performed.   ABNORMALITY - Intermittent slow, generalized IMPRESSION: This study is suggestive of mild diffuse encephalopathy. No seizures or epileptiform discharges were seen throughout the recording. Shawna [name]    Assessment: Caterra K. Zenker is an 82 year old female with a history of dementia, ESRD on HD, Afib on ASA, CAD, congestive heart failure, small bowel AVMs, GERD, HTN, colon cancer s/p resection in 1992, duodenal adenoma, presenting this admission with weakness and fatigue following dialysis.    Hgb 7.1 on admission, s/p 1 unit PRBCs. Hgb 10.1 yesterday, 9.4 today, suspect some equilibration. No overt GI bleeding throughout admission. Prior history of gastric AVMs s/p APC therapy in Feb 2024. Capsule with small AVMs in small bowel, possible jejunum, and 2 small polyps  in distal small bowel. She underwent small bowel endoscopy yesterday which showed non bleeding gastric ulcer (biopsied), jejunal polyp and non bleeding  jejunal diverticula. Recommended continuing octreotide BID, will need to be discharged home on this as well.   She reports some epigastric pain and odynophagia today, odynophagia likely secondary to endo procedure yesterday, can do short course of magic mouthwash swish and swallow. Consider course of carafate 1g QID for her abdominal pain. Difficult historian due to dementia. She reported small intake of breakfast though appears she finished entire meal. Hospitalist planning to d/c today. Discussed subjective worsening abdominal pain, plans to do CT A/P, okay to discharge if unremarkable and no overt GI bleeding.    Plan: Continue Octreotide BID (request sent for outpatient PA so pt can continue on this at d/c) Follow for path results Magic mouthwash swish and swallow TID  Consider Carafate 1g QID for epigastric pain CT A/P, okay to dc if unremarkable and no overt GI bleeding   Once ready for d/c, plan for outpatient follow up with GI in 2-3 weeks, repeat h&h in 1 week   LOS: 4 days    11/02/2022, 9:17 AM   Chelsea L. Jeanmarie Hubert, MSN, APRN, AGNP-C Adult-Gerontology Nurse Practitioner [facility]   Attending note: CT findings reviewed.  Gallbladder does appear suspicious.  Given negative upper endoscopy yesterday (  as far as abdominal pain concerned)  I fully agree with pursuing an ultrasound.

## 2022-11-02 NOTE — Progress Notes (Deleted)
Patient Information  Patient Name Shawna [name], Shawna [name] ([id number]) Legal Sex Female DOB Sep 27, 1940  Room Bed  IC05 IC05-01    Patient Demographics  Address [address] [place] [address] Phone [phone] (Home) *Preferred*    Basic Information  Date Of Birth [birth year] Gender Identity Female Race Black or African American Ethnic Group Not Hispanic or Latino Preferred Language English    Patient Contacts  Name Relation Home Work Mobile  Dauzat,Nathaniel Spouse [phone]  [phone]  Trellis Moment Daughter [phone]  [phone]    Documents on File   Status Date Received Description  Documents for the Patient  [place] E-Signature HIPAA Notice of Privacy Received 01/05/12   Driver's License Received 09/07/15   Advance Directives/Living Will/HCPOA/POA Not Received    Insurance Card Received (Expired) 02/02/12 UHC  AMB Outside Hospital Record Not Received  06/13 d/s h\T\p [facility]  AMB Outside Consult Note Not Received  06/13 card [facility]  Release of Information Received 03/02/12 AUTH 2D ECHO 03/08/12  Financial Application Not Received    Release of Information Not Received  PSCAN ENTERED INFO: [id number]  AMB Outside Hospital Record Not Received  10/13 PN [facility]  AMB Outside Consult Note Not Received  10/13 Card [facility]  VVS Policy for Pain - E Signature Not Received    Release of Information Not Received    AMB Correspondence Not Received  12/13 med adherence UHC  AMB Outside Consult Note Not Received  08/13 [facility]  AMB Outside Hospital Record Not Received  08/13 ORDER [facility]  Insurance Card Not Received (Expired)  wmc  [place] HIPAA NOTICE OF PRIVACY - Scanned Not Received    Release of Information Not Received    AMB HH/NH/Hospice Not Received  02/14 Order [facility]  Release of Information Not Received    Release of Information Not Received    AMB HH/NH/Hospice Not Received  01/14 order [facility]  AMB HH/NH/Hospice Not Received  03/14 Order [facility]  AMB Outside ED Note Not Received  05/14 report [facility]  Insurance Card Not Received (Expired)    AMB Correspondence Not Received  07/14 [facility]  Eulogio Ditch  AMB Outside Consult Note Not Received  07/14 neph [facility]  AMB Outside Hospital Record Not Received  07/14 h/p [facility]  AMB Outside Consult Note Not Received  07/14 card [facility]  Release of Information Not Received    Release of Information Not Received    Release of Information Not Received    Insurance Card Received (Expired) 12/30/14 UHC/RCGD  Insurance Card Received (Expired) 08/08/13   AMB Correspondence  08/16/13 01/14-07-14 Rx UHC  HIM ROI Authorization  (Expired) 12/03/13 Please send H&P and D/C Summary for visit of 11/27/13 ASAP for MD & CN.  Release of Information  12/04/13   AMB Correspondence  12/05/13 OFFICE [facility]  Release of Information  03/17/14   Release of Information  04/07/14   Insurance Card Received (Expired) 08/14/14 UHC MEDICARE  - [facility]  Release of Information  10/06/14   HIM ROI Authorization  (Expired) 12/12/14 Patient was seen in ER on 12/11/14.  Please fax ER records over ASAP to be reviewed.  Release of Information  12/22/14   AMB Correspondence  11/20/14 OFFICE [facility]  Release of Information  02/17/15   HIM ROI Authorization  (Expired) 02/18/15 Authorization for batch [employer]/UnitedHealthCare  fbg          02/18/15  HIM ROI Authorization  04/01/15 FULFILLED VERBAL  HIM ROI Authorization  (Expired) 05/08/15 Please send H&P and discharge summary from most recent visit ASAP for MD & CN.  Release of Information  05/11/15   Insurance Card Received (Expired) 07/28/15 AETNA - [employer] [facility] [employer] Card Received (Expired) 08/13/15 Aetna Medicare 2017  HIM ROI Authorization  (Expired) 09/04/15 Most recent H&P & Discharge Summary ASAP for MD & CN for continuity of care.  Was discharged on 08/03/15.   Release of Information  09/07/15   AMB Correspondence  10/04/15   Other Photo ID Not Received    Release of Information   Drucie Opitz for [facility] 2017  [facility] E-Signature HIPAA Notice of Privacy Signed 03/09/16   HIM ROI Authorization  (Expired) 03/09/16 [facility]  HIM ROI Authorization  (Expired) 04/11/16 [facility]  AMB Correspondence  04/29/16 RECALL ASSESSMENT  STARK MD  HIM ROI Authorization  (Expired) 07/15/16 [facility]  HIM ROI Authorization  07/30/16 AETNA  HIM ROI Authorization  07/30/16 AETNA  HIM ROI Authorization  08/05/16 no [employer]  (Expired)  Monia Pouch, 08/11/16 mar  HIM ROI Authorization  08/13/16 AETNA  Insurance Card Received (Expired) 08/25/16 AETNA MEDICARE/RCGD/HHS  [place] E-Signature HIPAA Notice of Privacy Signed 09/07/16   HIM ROI Authorization  (Expired) 09/09/16 [facility] E-Signature HIPAA Notice of Privacy Spanish     HIM ROI Authorization  09/22/16 NO AUTHO  Release of Information Received 10/06/16 DPR ALL [facility]  2018  HIM ROI Authorization  11/01/16 NO AUTHO  HIM ROI Authorization  11/01/16 CONTINUITY OF CARE  HIM ROI Authorization  11/22/16 no auth  HIM ROI Authorization  12/16/16 [facility]  HIM ROI Authorization  12/22/16 no auth  HIM ROI Authorization  01/23/17 [facility]  Driver's License Received 02/21/17 RCGD/HHS  HIM ROI Authorization  (Expired) 02/28/17 Authorization for batch [employer]/Aetna Medicare Risk Adjustment         LM       02/28/17  HIM ROI Authorization  03/20/17 Aetna Medicare Denial  AMB Correspondence Received 05/03/17 ORDER [facility]  AMB Provider Completed Forms Received 05/03/17 ORDER [facility] [place]  AMB Provider Completed Forms Received 05/05/17 REQUEST FOR CLEARANCE [facility] [address] [facility]  Advanced Beneficiary Notice (ABN) Not Received    E-Signature AOB  Spanish Not Received     [place] E-Signature HIPAA Notice of Privacy Signed 07/14/17   HIM ROI Authorization  08/30/17 AETNA CLINICAL CHART VALIDATION  HIM ROI Authorization  09/08/17 EM PA  Insurance Card Received (Expired) 09/19/17 aetna mcr  HIM ROI Authorization  09/26/17 RCGD--09/22/17 labs to PCP (howard)  Release of Information Received 11/21/17 auth for Rehman 5-19  HIM ROI Authorization  (Expired) 01/30/18 Authorization for batch AETNA/EpiSource 2019 Chart Request   fbg   01/30/18  HIM ROI Authorization  02/02/18 CONTINUITY OF CARE  Insurance Card Received (Expired) 02/06/18   HIM ROI Authorization  (Expired) 02/16/18 [facility]  HIM ROI Authorization  (Expired) 06/13/18 Authorization for batch EPISource/AETNA 2019 MRA Review    fbg   06/13/18  [place] E-Signature HIPAA Notice of Privacy Signed 07/13/18   Insurance Card Received (Expired) 12/04/18 Aetna 2020 RCGD  HIM ROI Authorization Received 12/25/18 [facility] [place] [facility]  HIM ROI Authorization  (Expired) 01/16/19 Authorization for batch  EpiSource/AETNA MRA Review [id number]   fbg  01/16/19  HIM ROI Authorization Received 02/06/19 [facility]  HIM ROI Authorization Received 02/08/19 [facility]  AMB Provider Completed Forms Received 02/20/19 [facility]  HIM ROI Authorization  03/19/19 RCGD--03/07/19 op note & pathology to PCP (howard)  AMB Referral Received 02/27/19 [facility] VEIN & VASC[facility]  HIM ROI Authorization  07/09/19 HMS  Insurance Card Received (Expired) 09/10/19 Aetna Mcr 2021 RCGD  HIM ROI Authorization  09/13/19 RCGD--09/10/19 prog note to [facility]  Driver's License Received 09/26/19 03/2023  HIM ROI Authorization  10/08/19 RCGD--09/26/19 EGD op note & pathology to PCP (howard)  [place] E-Signature HIPAA Notice of Privacy Signed 11/22/19   HIM ROI Authorization Received 12/30/19 [facility]  AMB Correspondence Received 03/04/20  [facility]  HIM ROI Authorization Received 06/26/20 EPISOURCE/[facility] BJ#[id number]  Insurance Card Received (Expired) 07/14/20 AETNA 2022  [place] E-Signature HIPAA Notice of Privacy Signed 07/14/20   HIM ROI Authorization Received 08/13/20 [facility] [employer] Card Not Received 07/15/21 WILL BRING ON PROCEDURE DAY  HIM ROI Authorization Received 10/14/20 [facility]  HIM ROI Authorization  11/04/20 pt acct  HIM ROI Authorization Received 11/13/20 [facility]  HIM ROI Authorization  11/19/20 OMNICLAIM  Insurance Card Received (Expired) 12/01/20 Aetna Mcr  2022  RCGD  HIM ROI Authorization Received 12/18/20 [facility]  Insurance Card Received (Expired) 01/07/21 AETNA MEDICARE 22  Release of Information Received 01/07/21 [facility] DPR 2022  HIM ROI Authorization  02/01/21 COTIVITI - [id number]  HIM ROI Authorization Received 02/18/21 [facility]  [place] E-Signature HIPAA Notice of Privacy Signed 10/28/22   Insurance Card Received (Expired) 02/27/21 ins card  HIM ROI Authorization Received 03/01/21 OMNICLAIM  HIM ROI Authorization  03/18/21 pt acct  HIM ROI Authorization  05/03/21 Episource  HIM ROI Authorization Received 07/09/21 [facility]  HIM ROI Authorization Received 07/26/21 [facility]  ACP Flag Received 08/19/21   Goals of Care Received 08/19/21   HIM ROI Authorization Received 09/03/21 [facility]  HIM ROI Authorization  09/10/21   Insurance Card Received (Expired) 11/04/21 Aetna Medicare AVVS 4.27.23  HIM ROI Authorization Received 01/04/22 OMNICLAIM  HIM ROI Authorization Received 01/27/22 OMNICLAIM  HIM ROI Authorization Received 06/06/22 OMNICLAIM  HIM ROI Authorization  06/14/22   Insurance Card Received (Expired) 07/14/22 Aetna  HIM ROI Authorization Received 07/14/22 DPR 07/2022  HIM ROI Authorization Received 07/18/22 OMNICLAIM  HIM ROI  Authorization Received (Expired) 07/20/22 DPR 07/2022  HIM ROI Authorization Received 08/01/22 [facility] [facility]  HIM ROI Authorization Received 08/08/22 [facility]  HIM ROI Authorization Received 09/07/22 OMNICLAIM  AMB Correspondence Received 09/23/22 [facility]  AMB Correspondence Received 10/10/22 [facility]  [place] HIPAA NOTICE OF PRIVACY - Scanned Not Received (Deleted)    [place] E-Signature HIPAA Notice of Privacy Spanish  (Deleted) 11/26/13   Insurance Card Not Received (Deleted)    Financial Application Not Received (Deleted)    AMB Outside Consult Note  (Deleted) 01/13/12   HIM ROI Authorization Not Received (Deleted)  PA PP  AMB Outside Hospital Record  (Deleted) 05/08/12   AMB Outside Consult Note  (Deleted) 05/08/12   AMB Outside Consult Note  (Deleted) 05/08/12   AMB Outside Consult Note  (Deleted) 05/08/12 10/13 [facility]  HIM ROI Authorization Not Received (Deleted)    Insurance Card Not Received (  Deleted)    [place] HIPAA NOTICE OF PRIVACY - Scanned Not Received (Deleted)    HIM ROI Authorization Not Received (Deleted)    AMB HH/NH/Hospice  (Deleted) 09/12/12   HIM ROI Authorization Not Received (Deleted)    HIM ROI Authorization Not Received (Deleted)    HIM ROI Authorization Not Received (Deleted)    AMB HH/NH/Hospice  (Deleted) 09/27/12   AMB HH/NH/Hospice  (Deleted) 10/09/12   Insurance Card Received (Deleted) 01/24/13 VVS- UHC  AMB Correspondence  (Deleted) 03/20/13 07/14 OFFICE NOTE [facility]  F  HIM ROI Authorization Not Received (Deleted)    HIM ROI Authorization Not Received (Deleted)    HIM ROI Authorization Not Received (Deleted)    HIM ROI Authorization  (Deleted) 01/01/14 UHC Audit  HIM ROI Authorization  (Deleted) 01/19/14   HIM ROI Authorization  (Deleted) 02/14/14   HIM ROI Authorization  (Deleted) 03/26/14   HIM ROI Authorization  (Deleted) 05/22/14 ECS representing UnitedHealthCare/  [employer]  HIM ROI Authorization  (Deleted) 06/11/14   Other Photo ID Not Received (Deleted)    HIM ROI Authorization  (Deleted) 10/04/14   HIM ROI Authorization  (Deleted) 12/04/14 patient accounting - deidra thompson  HIM ROI Authorization  (Deleted) 12/31/14 RCGD--12/30/14 prog note to PCP (howard)  HIM ROI Authorization  (Deleted) 01/30/15   HIM ROI Authorization  (Deleted) 04/02/15 RCGD--04/02/15 prog note to PCP (howard)  HIM ROI Authorization  (Deleted) 05/08/15 RCGD--04/30/15 EGD op note & H pylori result to PCP (howard)  HIM ROI Authorization  (Deleted) 05/13/15 RCGD--05/12/15 prog note to PCP (howard)  HIM ROI Authorization  (Deleted) 08/27/15 RCGD--12/30/14 to 08/20/15 prog notes, labs & EGD OP note to Aetna for Clincial Review for PA on procedure  HIM ROI Authorization  (Deleted) 09/02/15 RCGD--08/04/11 TCS op note & pathology to Aetna for PA on GIVENS capsule  Advanced Beneficiary Notice (ABN) Not Received (Deleted)    E-Signature AOB  Spanish Not Received (Deleted)    HIM ROI Authorization  (Deleted) 12/22/16 no auth  HIM ROI Authorization  (Deleted) 01/05/17   HIM ROI Authorization  (Deleted) 04/25/17   HIM Release of Information Output  (Deleted) 08/30/17 Abstract of Health Information  HIM Release of Information Output  (Deleted) 09/08/17 Notes Individual, Entire Encounter  HIM Release of Information Output  (Deleted) 09/26/17 Orders/Results Individual  HIM Release of Information Output  (Deleted) 02/02/18 Requested records  HIM Release of Information Output  (Deleted) 02/16/18 Requested records  HIM Release of Information Output  (Deleted) 02/16/18 Requested records  HIM Release of Information Output  (Deleted) 05/04/18 Requested records  HIM Release of Information Output  08/29/18 Requested records  HIM Release of Information Output  12/26/18 Requested records  HIM Release of Information Output  12/28/18 Requested records  HIM Release of Information Output  02/07/19  Requested records  HIM Release of Information Output  02/07/19 Requested records  HIM Release of Information Output  02/11/19 Requested records  HIM Release of Information Output  03/19/19 Requested records  HIM Release of Information Output  07/09/19 Requested records  HIM Release of Information Output  09/13/19 Requested records  HIM Release of Information Output  10/08/19 Requested records  HIM Release of Information Output  12/30/19   AMB Correspondence Received (Deleted) 03/10/20 AETNA PA FOR CT SCAN  HIM Release of Information Output  06/26/20 Requested records  HIM Release of Information Output  06/26/20   HIM Release of Information Output  06/26/20   HIM Release of Information Output  08/13/20 Requested records  HIM Release of Information Output  10/14/20 Requested records  HIM Release of Information Output  (Deleted) 11/04/20 Requested records  HIM Release of Information Output  11/13/20 Requested records  HIM Release of Information Output  11/19/20 Requested records  HIM Release of Information Output  11/19/20 Requested records  HIM Release of Information Output  12/18/20 Requested records  HIM Release of Information Output  (Deleted) 02/01/21 Requested records  HIM Release of Information Output  02/19/21   HIM Release of Information Output  02/19/21   HIM Release of Information Output  03/04/21 Requested records  HIM Release of Information Output  (Deleted) 03/18/21 Requested records  HIM Release of Information Output  05/03/21 Requested records  HIM Release of Information Output  07/09/21 Requested records  HIM Release of Information Output  07/26/21 Requested records  HIM Release of Information Output  09/06/21 Requested records  Insurance Card  (Deleted)    HIM Release of Information Output  09/10/21 Requested records  HIM Release of Information Output  12/29/21 Requested records  HIM Release of Information Output  01/07/22 Requested records  HIM Release of Information  Output  02/02/22 Requested records  HIM Release of Information Output  (Deleted) 04/22/22 Requested records  HIM Release of Information Output  06/09/22 Requested records  HIM Release of Information Output  06/14/22 Requested records  HIM Release of Information Output  07/08/22 Requested records  HIM Release of Information Output  07/18/22 Requested records  HIM Release of Information Output  08/02/22 Requested records  HIM Release of Information Output  08/09/22 Requested records  HIM Release of Information Output  09/14/22 Requested records  Documents for the Encounter  AOB  (Assignment of Insurance Benefits) Not Received    E-signature AOB Signed 10/28/22   MEDICARE RIGHTS Not Received    E-signature Medicare Rights Signed 10/28/22   Annual Exam - E-Signature PCP     Waiver of MSE E-sig Signed 10/28/22   ED Patient Billing Extract   ED PB Billing Extract  Cardiac Monitoring Strip Received 10/29/22   Cardiac Monitoring Strip Shift Summary Received 10/29/22   EKG Received 10/31/22   Small Bowel Endoscopy Electronic Received 11/01/22     Admission Information   Current Information  Attending Provider Admitting Provider Admission Type Admission Status  Tat, Onalee Hua, MD Joseph Art, DO Emergency Confirmed Admission        Admission Date/Time Discharge Date Hospital Service Auth/Cert Status  10/28/22  2101  Acute Care Incomplete        Hospital Area Unit Room/Bed   [facility] AP-ICCUP NURSING IC05/IC05-01             Admission  Complaint  weakness    Hospital Account  Name Acct ID Class Status Primary Coverage  Leonie, Amacher [id number] Inpatient Open UNITED HEALTHCARE MEDICARE - [facility] MEDICARE        Guarantor Account (for Hospital Account [id number])  Name Relation to Pt Service Area Active? Acct Type  [name] Self CHSA Yes Personal/Family  Address Phone    [address] [place], [place] [address] [phone](H)          Coverage Information (for  Hospital Account [id number])  F/O Payor/Plan Precert #  [facility] MEDICARE/UHC MEDICARE   Subscriber Subscriber #  Kaylla, Cobos [id number]  Address Phone  [address] [place], [place] [address] [phone]         Care Everywhere ID:  [id number]

## 2022-11-02 NOTE — Progress Notes (Signed)
Reviewed results of CT abdomen today There are some mildly dilated loops of small bowel with some air-fluid levels but no abrupt transition. Ileus versus mild partial obstruction is in the differential recommend close follow-up.   Colonic diverticulosis.   Distended gallbladder with wall thickening and some stranding. In addition there is some high attenuation material in the gallbladder.   --suspect mild ileus due to acute medical issues --had BM 4/23, now still passing flatus --increase activity --will order [place] RUQ for am and add LFTs --obtain blood cultures x 2 --start empiric zosyn pending RUQ [place] and blood cultures  DTat

## 2022-11-02 NOTE — Progress Notes (Signed)
[facility] Progress Note   Assessment/ Plan:   ESRD MWF L IJ TDC [facility]: on scheudle., no heparin, Max UF 2L.  HD today ABLA with likely UGI source, GI following. Transfused at presentation.  EGD/enteroscopy 4/23 wit GI HTN/Vol:BPs improved with UF.  Still looks like a little volume to give. Anemia: as above CKD-BMD: Ca corrects to around 8, and P ok.  Continue calcitriol.  Cinacalcet is 3 times weekly.  If calcium cont to drop further discontinue cinacalcet.  Resume binders once eating. AMS: Neurology s/o.  Waxing/waning.  Likely delirium.  Some concern for seizures. Dispo: pending  Subjective:    Seen in room.  Had push enteroscopy yesterday.  For HD today.     Objective:   BP (!) 131/51   Pulse (!) 57   Temp 98.6 F (37 C) (Oral)   Resp 17   Ht  (1.651 m)   Wt 57.7 kg   SpO2 100%   BMI 21.17 kg/m   Physical Exam: Gen:NAD, sitting in bed CVS: RRR Resp: muffled at bases Abd: soft Ext: trace LE edema, 1+ UE edema, R > L ACCESS: clotted AVF, L TDC + T/B  Labs: BMET Recent Labs  Lab 10/28/22 2129 10/30/22 0546 10/30/22 1900 10/31/22 0541 11/01/22 1055 11/02/22 0537  NA 136 137 133* 135 132* 131*  K 3.5 5.1 5.0 4.6 3.9 3.9  CL 99 100 100 102 97* 98  CO2 25 25 15* 19* 25 21*  GLUCOSE 136* 89 193* 130* 87 103*  BUN 23 44* 55* 61* 30* 34*  CREATININE 3.63* 5.88* 6.72* 7.24* 5.05* 5.97*  CALCIUM 8.1* 7.4* 7.4* 7.5* 7.2* 7.2*  PHOS  --  5.6*  --   --   --  5.3*   CBC Recent Labs  Lab 10/28/22 2129 10/29/22 1931 10/30/22 0546 10/30/22 1900 10/31/22 0541 11/02/22 0537  WBC 6.2   < > 6.2 8.4 8.1 9.6  NEUTROABS 4.6  --   --   --   --   --   HGB 7.1*   < > 10.6* 10.9* 10.1* 9.4*  HCT 22.1*   < > 32.5* 34.4* 30.8* 28.2*  MCV 120.8*   < > 105.9* 110.3* 104.4* 105.6*  PLT 215   < > 192 188 191 151   < > = values in this interval not displayed.      Medications:     amiodarone  100 mg Oral Daily   calcitRIOL  1.75 mcg Oral Q  M,W,F-HD   Chlorhexidine Gluconate Cloth  6 each Topical Daily   Chlorhexidine Gluconate Cloth  6 each Topical Q0600   cinacalcet  30 mg Oral Q M,W,F-HD   levETIRAcetam  500 mg Oral Daily   levothyroxine  75 mcg Oral QAC breakfast   lisinopril  20 mg Oral Daily   metoprolol tartrate  12.5 mg Oral BID   octreotide  100 mcg Subcutaneous Q12H   rosuvastatin  10 mg Oral Daily     Bufford Buttner, MD 11/02/2022, 9:31 AM

## 2022-11-02 NOTE — Progress Notes (Signed)
See Hemodialysis Note for weight change.

## 2022-11-02 NOTE — TOC Progression Note (Addendum)
Transition of Care [facility]) - Progression Note    Patient Details  Name: Shawna [name] MRN: [id number] Date of Birth: [birth year]  [facility]) CM/SW Contact  Karn Cassis, [place] Phone Number: 11/02/2022, 3:29 PM  Clinical Narrative:  LCSW sent referral to [facility] office [phone]) for home health PT/RN. LCSW followed up this afternoon and they are currently reviewing.    Update: Per Maralyn Sago at [facility], they can see pt for PT/RN. Daughter notified by voicemail.    Expected Discharge Plan: Home/Self Care Barriers to Discharge: Continued Medical Work up  Expected Discharge Plan and Services In-house Referral: Clinical Social Work     Living arrangements for the past 2 months: Single Family Home                                       Social Determinants of Health (SDOH) Interventions SDOH Screenings   Food Insecurity: No Food Insecurity (10/29/2022)  Housing: Low Risk  (10/29/2022)  Transportation Needs: No Transportation Needs (10/29/2022)  Utilities: Not At Risk (10/29/2022)  Financial Resource Strain: Low Risk  (12/14/2018)  Physical Activity: Unknown (12/14/2018)  Social Connections: Unknown (12/14/2018)  Stress: No Stress Concern Present (12/14/2018)  Tobacco Use: Low Risk  (11/01/2022)    Readmission Risk Interventions    10/31/2022   12:45 PM 08/29/2022    4:47 PM 11/12/2021    8:56 AM  Readmission Risk Prevention Plan  Transportation Screening Complete Complete Complete  Medication Review Oceanographer) Complete Complete Complete  PCP or Specialist appointment within 3-5 days of discharge  Complete Complete  HRI or Home Care Consult Complete Complete Complete  SW Recovery Care/Counseling Consult Complete Complete Complete  Palliative Care Screening Not Applicable Not Applicable Not Applicable  Skilled Nursing Facility Not Applicable Complete Complete

## 2022-11-02 NOTE — Progress Notes (Signed)
Patient's MEWS jumped to yellow r/t HR 118 and high pain score. Xanax and dilaudid administered. Endorses to night shift RN and unit 300 receiving RN

## 2022-11-02 NOTE — Progress Notes (Signed)
Report given the unit 300 receiving nurse. Patient is currently being dialyzed in the current ICU room and will be take to her new room 304 when she is done.

## 2022-11-02 NOTE — Telephone Encounter (Signed)
Patient still in patient. I will submit rx to [facility] once patient discharged as they can show how to administer.

## 2022-11-02 NOTE — Procedures (Addendum)
Hemodialysis Treatment Note:  3.5 hour treatment completed as ordered using Left IJ HD catheter. Net fluid removal of . Patient started with elevated blood pressures pre treatment, but did improve during treatment. One episode of decreased BP (systolic 99) that required NS bolus and brief pause in UF. Nephrologist aware. Patient remained asymptomatic except for abdominal pain for which she received her ordered pain medication. Tolerated treatment overall. Report given to Primary RN.  Post weight 51.5kg. Primary RN and this RN re-weighed patient 2 times. Therefore, weight discrepancy is likely due to incorrect daily weight from today. Primary RN aware of increased BP post treatment and will administer medications as ordered.

## 2022-11-03 ENCOUNTER — Inpatient Hospital Stay (HOSPITAL_COMMUNITY): Payer: Medicare Other

## 2022-11-03 DIAGNOSIS — R1033 Periumbilical pain: Secondary | ICD-10-CM | POA: Diagnosis not present

## 2022-11-03 DIAGNOSIS — E119 Type 2 diabetes mellitus without complications: Secondary | ICD-10-CM | POA: Diagnosis not present

## 2022-11-03 DIAGNOSIS — I25118 Atherosclerotic heart disease of native coronary artery with other forms of angina pectoris: Secondary | ICD-10-CM | POA: Diagnosis not present

## 2022-11-03 DIAGNOSIS — K567 Ileus, unspecified: Secondary | ICD-10-CM

## 2022-11-03 DIAGNOSIS — E039 Hypothyroidism, unspecified: Secondary | ICD-10-CM | POA: Diagnosis not present

## 2022-11-03 DIAGNOSIS — K5520 Angiodysplasia of colon without hemorrhage: Secondary | ICD-10-CM | POA: Diagnosis not present

## 2022-11-03 DIAGNOSIS — D649 Anemia, unspecified: Secondary | ICD-10-CM | POA: Diagnosis not present

## 2022-11-03 DIAGNOSIS — D62 Acute posthemorrhagic anemia: Secondary | ICD-10-CM | POA: Diagnosis not present

## 2022-11-03 LAB — CBC
HCT: 30.5 % — ABNORMAL LOW (ref 36.0–46.0)
Hemoglobin: 9.9 g/dL — ABNORMAL LOW (ref 12.0–15.0)
MCH: 35.4 pg — ABNORMAL HIGH (ref 26.0–34.0)
MCHC: 32.5 g/dL (ref 30.0–36.0)
MCV: 108.9 fL — ABNORMAL HIGH (ref 80.0–100.0)
Platelets: 180 10*3/uL (ref 150–400)
RBC: 2.8 MIL/uL — ABNORMAL LOW (ref 3.87–5.11)
RDW: 26.2 % — ABNORMAL HIGH (ref 11.5–15.5)
WBC: 5.4 10*3/uL (ref 4.0–10.5)
nRBC: 0 % (ref 0.0–0.2)

## 2022-11-03 LAB — GLUCOSE, CAPILLARY: Glucose-Capillary: 200 mg/dL — ABNORMAL HIGH (ref 70–99)

## 2022-11-03 LAB — COMPREHENSIVE METABOLIC PANEL
ALT: 12 U/L (ref 0–44)
AST: 12 U/L — ABNORMAL LOW (ref 15–41)
Albumin: 3 g/dL — ABNORMAL LOW (ref 3.5–5.0)
Alkaline Phosphatase: 85 U/L (ref 38–126)
Anion gap: 10 (ref 5–15)
BUN: 24 mg/dL — ABNORMAL HIGH (ref 8–23)
CO2: 26 mmol/L (ref 22–32)
Calcium: 7.4 mg/dL — ABNORMAL LOW (ref 8.9–10.3)
Chloride: 97 mmol/L — ABNORMAL LOW (ref 98–111)
Creatinine, Ser: 4.62 mg/dL — ABNORMAL HIGH (ref 0.44–1.00)
GFR, Estimated: 9 mL/min — ABNORMAL LOW (ref >60)
Glucose, Bld: 138 mg/dL — ABNORMAL HIGH (ref 70–99)
Potassium: 3.6 mmol/L (ref 3.5–5.1)
Sodium: 133 mmol/L — ABNORMAL LOW (ref 135–145)
Total Bilirubin: 0.2 mg/dL — ABNORMAL LOW (ref 0.3–1.2)
Total Protein: 5.9 g/dL — ABNORMAL LOW (ref 6.5–8.1)

## 2022-11-03 LAB — CULTURE, BLOOD (ROUTINE X 2): Culture: NO GROWTH

## 2022-11-03 MED ORDER — SODIUM CHLORIDE 0.9 % IV SOLN
2.2500 g | Freq: Three times a day (TID) | INTRAVENOUS | Status: DC
  Administered 2022-11-03 – 2022-11-08 (×15): 2.25 g via INTRAVENOUS
  Filled 2022-11-03 (×20): qty 10

## 2022-11-03 MED ORDER — POLYETHYLENE GLYCOL 3350 17 G PO PACK
17.0000 g | PACK | Freq: Two times a day (BID) | ORAL | Status: DC
  Administered 2022-11-03 – 2022-11-04 (×2): 17 g via ORAL
  Filled 2022-11-03 (×2): qty 1

## 2022-11-03 NOTE — Progress Notes (Signed)
Patient arrived to unit alert and oriented x3 but drowsy. Patient has periods of confusion that come and go. Vitals stable. Patient husband at the bedside. Patient has no complaints of pain at this time. Bed alarm on. Call bell within reach. Will continue to monitor.

## 2022-11-03 NOTE — Progress Notes (Signed)
Patient scheduled metoprolol tartrate (lopressor) held due to HR <60. MD Zierle-Ghosh made aware.

## 2022-11-03 NOTE — Progress Notes (Signed)
Gastroenterology Progress Note   Referring Provider: No ref. provider found Primary Care Physician:  [facility] Patient ID: KRYSTALL KRUCKENBERG; [id number]; Apr 29, 1983   Subjective:    Sitting up in chair. Ate 75% of her lunch. States she is feeling ok. Says she has had some abdominal pain.   Objective:   Vital signs in last 24 hours: Temp:  [97.5 F (36.4 C)-98.4 F (36.9 C)] 98.4 F (36.9 C) (04/25 0432) Pulse Rate:  [33-119] 91 (04/25 0432) Resp:  [11-20] 18 (04/25 0432) BP: (99-195)/(42-116) 146/85 (04/25 0432) SpO2:  [98 %-100 %] 99 % (04/25 0432) Weight:  [51.5 kg-58 kg] 51.5 kg (04/24 1850) Last BM Date : 10/31/22 General:   Alert,  Well-developed, well-nourished, pleasant and cooperative in NAD Head:  Normocephalic and atraumatic. Eyes:  Sclera clear, no icterus.   Abdomen:  Soft, nontender and nondistended. Mild left sided tenderness Normal bowel sounds, without guarding, and without rebound.   Extremities:  Without clubbing, deformity or edema.    Intake/Output from previous day: 04/24 0701 - 04/25 0700 In: 638.9 [P.O.:600; IV Piggyback:38.9] Out: 1700  Intake/Output this shift: No intake/output data recorded.  Lab Results: CBC Recent Labs    11/02/22 0537 11/02/22 1643 11/03/22 1156  WBC 9.6  --  5.4  HGB 9.4* 10.3* 9.9*  HCT 28.2* 30.3* 30.5*  MCV 105.6*  --  108.9*  PLT 151  --  180   BMET Recent Labs    11/01/22 1055 11/02/22 0537 11/03/22 1156  NA 132* 131* 133*  K 3.9 3.9 3.6  CL 97* 98 97*  CO2 25 21* 26  GLUCOSE 87 103* 138*  BUN 30* 34* 24*  CREATININE 5.05* 5.97* 4.62*  CALCIUM 7.2* 7.2* 7.4*   LFTs Recent Labs    11/02/22 0537 11/03/22 1156  BILITOT  --  0.2*  ALKPHOS  --  85  AST  --  12*  ALT  --  12  PROT  --  5.9*  ALBUMIN 2.9* 3.0*   No results for input(s): "LIPASE" in the last 72 hours. PT/INR No results for input(s): "LABPROT", "INR" in the last 72 hours.     Lab Results  Component Value Date    VITAMINB12 1,981 (H) 08/07/2022   Lab Results  Component Value Date   FOLATE >40.0 08/07/2022      Imaging Studies: CT ABDOMEN PELVIS WO CONTRAST  Result Date: 11/02/2022 CLINICAL DATA:  Acute abdominal pain after endoscopy EXAM: CT ABDOMEN AND PELVIS WITHOUT CONTRAST TECHNIQUE: Multidetector CT imaging of the abdomen and pelvis was performed following the standard protocol without IV contrast. RADIATION DOSE REDUCTION: This exam was performed according to the departmental dose-optimization program which includes automated exposure control, adjustment of the mA and/or kV according to patient size and/or use of iterative reconstruction technique. COMPARISON:  CT 04/06/2020 without contrast FINDINGS: Lower chest: Small bilateral pleural effusions identified with some adjacent opacities. Atelectasis is favored over infiltrate recommend follow-up. The heart is enlarged. Status post median sternotomy. Coronary artery calcifications are seen. Hepatobiliary: On this non IV contrast exam, the liver has some small calcifications, possibly related to old granulomatous disease. However the gallbladder is distended with some slight wall thickening and stranding. There is also some luminal high density. Sludge or stones are possible. If there is concern further of acute cholecystitis an ultrasound may be of some benefit as the next step in the workup. Pancreas: Moderate atrophy of the pancreas.  No obvious mass. Spleen: Spleen is nonenlarged. Adrenals/Urinary  Tract: The adrenal glands are grossly preserved. There is severe atrophy of the kidneys with several vascular calcifications and tiny cystic areas. The more complex hyperdense area along the upper pole of the right kidney is stable. Previously measuring 2.8 x 2.0 cm and today 2.5 by 1.5 cm. Contracted urinary bladder. Stomach/Bowel: There is some fluid and debris in the stomach. Moderate diffuse scattered colonic stool. Left-sided colonic diverticulosis.  Surgical changes along the rectosigmoid colon. No obstruction. Surgical changes as well along loops of bowel in the right lower quadrant. The appendix is not well seen. There are several loops of small bowel in the midabdomen which have air-fluid levels and are mildly dilated measuring up to 3.2 cm. No abrupt caliber change. Vascular/Lymphatic: Extensive vascular calcifications. Normal caliber aorta and IVC. No specific abnormal lymph node enlargement identified in the abdomen and pelvis. Reproductive: Status post hysterectomy. No adnexal masses. Other: Anasarca. No obstruction, free air or free fluid. Mild areas of mesenteric stranding and presacral space stranding. Musculoskeletal: Osteopenia. Scattered degenerative changes. Schmorl's node changes along lumbar spine and thoracic spine. IMPRESSION: Once again severely atrophic kidneys with calcifications and cystic areas including complex area along the upper pole of the right kidney which is stable in size. Recommend continued surveillance. Developing small bilateral pleural effusion with adjacent lung opacities. Atelectasis versus infiltrate. Recommend follow-up. Increasing anasarca mesenteric stranding. There are some mildly dilated loops of small bowel with some air-fluid levels but no abrupt transition. Ileus versus mild partial obstruction is in the differential recommend close follow-up. Colonic diverticulosis. Distended gallbladder with wall thickening and some stranding. In addition there is some high attenuation material in the gallbladder. Please correlate with any sludge or stones. Please correlate with any prior contrast administration. Overall recommend further workup with ultrasound to further delineate when able. Electronically Signed   By: Karen Kays M.D.   On: 11/02/2022 14:27   MR BRAIN WO CONTRAST  Result Date: 10/31/2022 CLINICAL DATA:  Altered mental status EXAM: MRI HEAD WITHOUT CONTRAST TECHNIQUE: Multiplanar, multiecho pulse sequences  of the brain and surrounding structures were obtained without intravenous contrast. COMPARISON:  04/23/2022 MRI, correlation is also made with 10/30/2022 CT head FINDINGS: Brain: No restricted diffusion to suggest acute or subacute infarct. No acute hemorrhage, mass, or midline shift. No hydrocephalus. 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 Normal pituitary and craniocervical junction. Remote lacunar infarcts in the bilateral basal ganglia and left cerebellum. Punctate foci of hemosiderin deposition in the right cerebellum, right campus, and right frontal and parietal lobes, possibly sequela of prior microhemorrhage. No superficial siderosis. Confluent T2 hyperintense signal in the periventricular white matter, likely the sequela of moderate chronic small vessel ischemic disease. Vascular: Normal arterial flow voids. Skull and upper cervical spine: Normal marrow signal. Sinuses/Orbits: Mucosal thickening in the left sphenoid sinus. No acute finding in the orbits. Other: Trace fluid in the mastoid air cells. IMPRESSION: 1. No acute intracranial process. No evidence of acute or subacute infarct. 2. 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 Electronically Signed   By: Wiliam Ke M.D.   On: 10/31/2022 19:31   EEG adult  Result Date: 10/31/2022 Charlsie Quest, MD     10/31/2022  2:57 PM Patient Name: Shawna [name] MRN: [id number] Epilepsy Attending: Charlsie Quest  Referring Physician/Provider: Rejeana Brock, MD Date:10/31/2022 Duration: 22.39 mins Patient history: 82yo F getting eeg to evaluate for seizure Level of alertness: Awake, asleep AEDs during EEG study: LEV Technical aspects: This EEG study was done with scalp electrodes positioned according to the 10-20 International system of electrode placement.  Electrical activity was reviewed with band pass filter of 1-70Hz , sensitivity of 7 uV/mm, display speed of 10mm/sec with a 60Hz  notched filter applied as appropriate. EEG data were recorded continuously and digitally stored.  Video monitoring was available and reviewed as appropriate. Description: The posterior dominant rhythm consists of 8-9 Hz activity of moderate voltage (25-35 uV) seen predominantly in posterior head regions, symmetric and reactive to eye opening and eye closing. Sleep was characterized by vertex waves, sleep spindles (12 to 14 Hz), maximal frontocentral region.  EEG showed intermittent generalized 3 to 6 Hz theta-delta slowing. Hyperventilation and photic stimulation were not performed.   ABNORMALITY - Intermittent slow, generalized IMPRESSION: This study is suggestive of mild diffuse encephalopathy. No seizures or epileptiform discharges were seen throughout the recording. Charlsie Quest   DG Abd 1 View  Result Date: 10/31/2022 CLINICAL DATA:  Abdominal pain EXAM: ABDOMEN - 1 VIEW COMPARISON:  08/29/2022 FINDINGS: Bowel gas pattern is nonspecific. Gas and stool are noted in colon. Small amount of stool is seen in colon. There are surgical staples and clips in lower abdomen and pelvis. There is no identifiable electronic device in abdomen and pelvis in the current study. In the previous examination, an electronic device, capsule most noted in region of rectum suggesting interval passage. Arterial calcifications are seen. IMPRESSION: Nonspecific bowel gas pattern. There is no evidence of any metallic electronic capsule in the abdomen and pelvis. Electronically Signed   By: Ernie Avena M.D.   On: 10/31/2022 08:40   CT HEAD CODE STROKE WO CONTRAST  Result Date: 10/30/2022 CLINICAL DATA:  Code stroke.  Seizure, altered mental status. EXAM: CT HEAD WITHOUT CONTRAST TECHNIQUE: Contiguous axial images were obtained from the base of the skull through the vertex without intravenous  contrast. RADIATION DOSE REDUCTION: This exam was performed according to the departmental dose-optimization program which includes automated exposure control, adjustment of the mA and/or kV according to patient size and/or use of iterative reconstruction technique. COMPARISON:  CT head 08/26/2022. FINDINGS: Brain: There is no evidence of acute territorial infarct. There are low-density subdural collections overlying both cerebral hemispheres measuring up to 6 mm on the left and 3 mm on the right without evidence of acute blood products, new since the prior head CT from 08/26/2022. There is no mass effect on the underlying brain parenchyma. Parenchymal volume loss with prominence of the ventricular system and extra-axial CSF spaces is stable. The ventricles are unchanged in size and configuration. A remote infarct in the left basal ganglia is unchanged. Additional confluent hypodensity in the supratentorial white matter likely reflects sequela of underlying chronic small-vessel ischemic change. The pituitary and suprasellar region are normal. There is no mass lesion. There is no midline shift. Vascular: There is calcification of the bilateral carotid siphons and vertebral arteries. Skull: Normal. Negative for fracture or focal lesion. Sinuses/Orbits: The paranasal sinuses are clear. The globes and orbits are unremarkable. Other: None. ASPECTS [facility] Stroke Program Early CT Score) - Ganglionic level infarction (caudate, lentiform nuclei, internal capsule, insula, M1-M3 cortex): 7 - Supraganglionic infarction (M4-M6 cortex): 3 Total score (0-10 with 10 being normal): 10 IMPRESSION: 1. No evidence of acute intracranial hemorrhage or acute territorial infarct. 2. Low-density  subdural hygromas overlying both cerebral hemispheres measuring up to 6 mm on the left and 3 mm on the right, new since the ct head from 08/26/2022 but without evidence of acute blood within the collections. These results communicated via Amion at  the time of interpretation on 10/30/2022 at 6:44 pm to provider [facility] , who verbally acknowledged these results. Electronically Signed   By: Lesia Hausen M.D.   On: 10/30/2022 18:46   DG Chest Port 1 View  Result Date: 10/28/2022 CLINICAL DATA:  Cough EXAM: PORTABLE CHEST 1 VIEW COMPARISON:  10/23/2022 FINDINGS: Prior CABG. Left dialysis catheter remains in place with the tip in the right atrium. Mild cardiomegaly. No overt edema. Bibasilar atelectasis. No effusions or acute bony abnormality. IMPRESSION: Cardiomegaly, bibasilar atelectasis. Electronically Signed   By: Charlett Nose M.D.   On: 10/28/2022 22:15   CT Chest Wo Contrast  Result Date: 10/23/2022 CLINICAL DATA:  Pneumonia, complication suspected, xray done EXAM: CT CHEST WITHOUT CONTRAST TECHNIQUE: Multidetector CT imaging of the chest was performed following the standard protocol without IV contrast. RADIATION DOSE REDUCTION: This exam was performed according to the departmental dose-optimization program which includes automated exposure control, adjustment of the mA and/or kV according to patient size and/or use of iterative reconstruction technique. COMPARISON:  Radiograph earlier today. Additional prior radiographs reviewed. Most recent chest CT available 07/16/2021 FINDINGS: Cardiovascular: Left-sided dialysis catheter tip is in the atrial caval junction. Mild cardiomegaly. Post CABG with calcification of native coronary arteries. Decreased density of the blood pool typical of anemia. Advanced aortic atherosclerosis with aortic tortuosity. No aneurysm or periaortic stranding. No pericardial effusion. Mediastinum/Nodes: 14 mm lower anterior paratracheal node, similar to 2023 exam. Scattered additional shoddy mediastinal lymph nodes are also unchanged. No progressive adenopathy. Patulous esophagus. Lungs/Pleura: Small bilateral pleural effusions, left slightly larger than right. Slight vascular congestion. Minimal fluid in the fissures.  Mild patchy ground-glass opacity in the dependent left greater than right lower lobe. Moderate central bronchial thickening which may be congestive or bronchitic. no endobronchial debris. No pulmonary mass. Upper Abdomen: Sequela of chronic renal disease with renal atrophy and cysts. No specific imaging follow-up of these cysts is recommended. A peripherally calcified density in the right upper kidney is stable from 2023 exam and consistent with a complex cyst, also needing no further follow-up. Left colonic diverticulosis. Musculoskeletal: Exaggerated thoracic kyphosis with degenerative change throughout the thoracic spine. Chronic mild L1 compression deformity. Slight increase in Schmorl's node inferior endplate of T11. Prior median sternotomy. There are no acute or suspicious osseous abnormalities. IMPRESSION: 1. Small bilateral pleural effusions, left slightly larger than right. Slight vascular congestion. 2. Mild patchy ground-glass opacity in the dependent left greater than right lower lobe may be atelectasis or pneumonia. 3. Moderate central bronchial thickening may be congestive or bronchitic. Aortic Atherosclerosis (ICD10-I70.0). Electronically Signed   By: Narda Rutherford M.D.   On: 10/23/2022 15:35   DG Chest Port 1 View  Result Date: 10/23/2022 CLINICAL DATA:  82 year old female with cough. Productive cough for 2 weeks. Some shortness of breath. EXAM: PORTABLE CHEST 1 VIEW COMPARISON:  Chest radiographs 10/02/2022 and earlier. FINDINGS: Portable AP upright view at 1107 hours. Prior CABG. Stable cardiomegaly and mediastinal contours. Calcified aortic atherosclerosis. Stable left chest dual lumen dialysis type catheter. Patchy, nonspecific increased left lung base opacity partially obscuring the diaphragm now. No superimposed pneumothorax or consolidation. Stable pulmonary vascularity without overt edema. No confluent right lung opacity. Stable visualized osseous structures.  Negative visible bowel  gas.  IMPRESSION: 1. Patchy, nonspecific increased left lung base opacity new since last month. PA and lateral views may be helpful if feasible. 2. Otherwise stable cardiomegaly, pulmonary vascularity, prior CABG, chest dialysis catheter, Aortic Atherosclerosis. Electronically Signed   By: Odessa Fleming M.D.   On: 10/23/2022 11:16  [2 weeks]  Assessment:   Tyjah K. Aerts is an 82 year old female with a history of dementia, ESRD on HD, Afib on ASA, CAD, congestive heart failure, small bowel AVMs, GERD, HTN, colon cancer s/p resection in 1992, duodenal adenoma, presenting this admission with weakness and fatigue following dialysis.    Hgb 7.1 on admission, s/p 1 unit PRBCs.  Hemoglobin 9.9 today. No overt GI bleeding throughout admission. Prior history of gastric AVMs s/p APC therapy in Feb 2024. Capsule with small AVMs in small bowel, possible jejunum, and 2 small polyps in distal small bowel. She underwent small bowel endoscopy yesterday which showed non bleeding gastric ulcer (biopsied), jejunal polyp and non bleeding  jejunal diverticula. Recommended continuing octreotide BID, will need to be discharged home on this as well.    CT abdomen pelvis without contrast ordered yesterday for complaints of abdominal pain.  With concerns for ileus versus mild partial small bowel obstruction, distended gallbladder with wall thickening and some stranding, possible sludge or stone present within the gallbladder.    Right upper quadrant ultrasound pending.  LFTs unremarkable.  Plan:   Continue octreotide 100 mcg twice daily (request sent to outpatient PA so patient can continue on this at discharge). Follow-up Path results. Miralax 17g bid.    LOS: 5 days   Leanna Battles. [facility] [phone] 4/25/20242:18 PM

## 2022-11-03 NOTE — Progress Notes (Addendum)
[name] Progress Note   Assessment/ Plan:   ESRD MWF L IJ TDC [facility]: on scheudle., no heparin, Max UF 2L.  Next HD Friday ABLA with likely UGI source, GI following. Transfused at presentation.  EGD/enteroscopy 4/23 wit GI HTN/Vol:BPs improved with UF.  Still looks like a little volume to give. Anemia: as above CKD-BMD: Ca corrects to around 8, and P ok.  Continue calcitriol.  Cinacalcet is 3 times weekly.  If calcium cont to drop further discontinue cinacalcet.  Resume binders once eating. AMS: Neurology s/o.  Waxing/waning.  Likely delirium.  Some concern for seizures. Abd pain: Ct looks like ileus ? Cholecystitis- blood cultures drawn, started Zosyn  Dispo: pending  Subjective:    Seen in room.  HD yesterday. 1.7L removed.  A little delirious it seems today but not agitated or upset.     Objective:   BP (!) 146/85   Pulse 91   Temp 98.4 F (36.9 C)   Resp 18   Ht  (1.651 m)   Wt 51.5 kg   SpO2 99%   BMI 18.89 kg/m   Physical Exam: Gen:NAD, sitting in bed CVS: RRR Resp: muffled at bases Abd: soft Ext: trace LE edema, 1+ UE edema, R > L ACCESS: clotted AVF, L TDC + T/B  Labs: BMET Recent Labs  Lab 10/28/22 2129 10/30/22 0546 10/30/22 1900 10/31/22 0541 11/01/22 1055 11/02/22 0537  NA 136 137 133* 135 132* 131*  K 3.5 5.1 5.0 4.6 3.9 3.9  CL 99 100 100 102 97* 98  CO2 25 25 15* 19* 25 21*  GLUCOSE 136* 89 193* 130* 87 103*  BUN 23 44* 55* 61* 30* 34*  CREATININE 3.63* 5.88* 6.72* 7.24* 5.05* 5.97*  CALCIUM 8.1* 7.4* 7.4* 7.5* 7.2* 7.2*  PHOS  --  5.6*  --   --   --  5.3*   CBC Recent Labs  Lab 10/28/22 2129 10/29/22 1931 10/30/22 0546 10/30/22 1900 10/31/22 0541 11/02/22 0537 11/02/22 1643  WBC 6.2   < > 6.2 8.4 8.1 9.6  --   NEUTROABS 4.6  --   --   --   --   --   --   HGB 7.1*   < > 10.6* 10.9* 10.1* 9.4* 10.3*  HCT 22.1*   < > 32.5* 34.4* 30.8* 28.2* 30.3*  MCV 120.8*   < > 105.9* 110.3* 104.4* 105.6*  --   PLT  215   < > 192 188 191 151  --    < > = values in this interval not displayed.      Medications:     amiodarone  100 mg Oral Daily   calcitRIOL  1.75 mcg Oral Q M,W,F-HD   Chlorhexidine Gluconate Cloth  6 each Topical Daily   Chlorhexidine Gluconate Cloth  6 each Topical Q0600   cinacalcet  30 mg Oral Q M,W,F-HD   levETIRAcetam  500 mg Oral Daily   levothyroxine  75 mcg Oral QAC breakfast   lisinopril  20 mg Oral Daily   magic mouthwash  5 mL Oral TID   metoprolol tartrate  12.5 mg Oral BID   octreotide  100 mcg Subcutaneous Q12H   rosuvastatin  10 mg Oral Daily     Bufford Buttner, MD 11/03/2022, 10:24 AM

## 2022-11-03 NOTE — Progress Notes (Signed)
PROGRESS NOTE  Shawna [name] ZOX:[id number] DOB: 04/05/1941 DOA: 10/28/2022 PCP: [facility]  Brief History:  82 year old female with a history of ESRD (MWF), coronary artery disease status post CABG, CHF, hypertension, hyperlipidemia, paroxysmal atrial fibrillation, hyperlipidemia, GI bleed presenting with generalized weakness and not feeling well after dialysis. At the time of admission, the patient was noted to have a hemoglobin of 7.1.  She was transfused 2 units PRBC.  GI was consulted to assist with management. Unfortunately, the patient had a rapid response called on the evening of 10/30/2022 secondary to altered mental status.  Apparently, the patient was noted to have seizure-like activity by nursing staff.  Apparently the patient was lethargic minimally responsive to painful stimuli.  There were shaking activity lasting 3 minutes noted by nursing staff.  Code stroke was activated.  CT of the brain was negative for hemorrhage.  There was some low-density subdural hygromas.  The patient was seen by teleneurology.  CTA of the head and neck was recommended, but family refused secondary to the patient's contrast allergy.  The patient was loaded with Keppra.  EEG was ordered.  They felt that the patient could stay at [facility] as long as she improves and follows commands.  She returns her to the ICU.  As the evening progressed, the patient was more responsive.  Apparently, the patient was refusing to follow commands but would speak when probed and speech was clear.  The patient did follow some simple commands.  The patient had recent hospitalization from 08/26/22 to 08/29/22 secondary to acute on chronic blood loss anemia.  Hemoccult was positive at that time.  The patmultiple gastric AVMs treated with APC, small hiatal hernia, no active or stigmata of bleeding.  Capsule was placed placed during EGD and showed few small AVMs in the small bowel, likely jejunum.  2 small polyps in  the distal small bowel previously noted. Capsule appears to sit at the ileocolonic anastomosis for the last 2 to 3 hours of study.  She was transfused 2 units PRBC during that hospital admission.  She is not felt to be a candidate for anticoagulation due to her recurrent GI bleeds.  The patient was noted to have hospital delirium and felt to have a degree of underlying cognitive impairment.  She also had a previous hospital admission from 08/06/2022 to 08/17/2022 due to acute on chronic encephalopathy during the hospitalization.  At best, it appears that the patient was oriented only to person at that time.  Family has noted that the patient has had confusion at home since wintertime 2023.  Once again, she had acute on chronic anemia.  She underwent EGD and colonoscopy on 08/10/2022.duodenal and colonic polyps which were removed. 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  She was seen by palliative medicine and full scope of care was continued.   Assessment/Plan: Acute on chronic blood loss anemia -GI consult appreciated -Transfused 1 unit PRBC this admission -Presented with hemoglobin 7.1 -Hemoglobin remained stable since transfusion -Blood loss felt to be secondary to the patient's gastric and small bowel AVMs -4/23 SB enteroscopy--nonbleeding gastric ulcer with clean base; non bleeding jejunal diverticula; no AVMs -she is treated with BID octreotide injections -Hgb remains largely stable around 8    Myoclonus/Seizure like activity -The patient has had similar episodes of what was described on the evening of 10/30/2022 in the past previously felt to be secondary to anemia and gabapentin in the setting of ESRD patient -  Appreciate neurology consult>>loaded keppra -Continue Keppra per neurologist - no further episodes on keppra  -10/31/22 AM-pt is lucid, A&Ox2, follows commands -11/01/22--pt remains lucid -11/02/22--lucid without any seizure like activity  Abdominal Pain -appears to be lower abd -CT abd/pelvis -  suggesting mild ileus and cholelithiasis  -[place] RUQ pending for further gallbladder work up  -tolerating diet, but endorses post-prandial pain -laxatives added for constipation / mild ileus    ESRD -Dialyzes Monday, Wednesday, Friday -last dialysis 11/02/2022 -Appreciate nephrology follow-up   Paroxysmal Atrial fibrillation/SVT -Not a candidate for anticoagulation secondary to recurrent GI bleed -Continue amiodarone and metoprolol  -currently in sinus, but has intermittent episodes of SVT   Essential hypertension -Continue home medications-metoprolol and lisinopril -Well-documented that the patient has had uncontrolled hypertension   Coronary Artery Disease s/p CABG in 2013 with DES to D1 in 10/2016 with cath showing patent LIMA-LAD, SVG-RI, SVG-Mrg and SVG-RCA and repeat cath in 07/2018 showing patent grafts with occlusion of D1 at prior stent site and progression of PDA disease and medical management was recommended -continue imdur, ASA 81 mg, simavastatin   Hypothyroidism -Continue home dose levothyroxine     Cognitive impairment -Documented over the last several hospitalization the patient has had baseline cognitive impairment and hospital delirium -She is at high risk for recurrent delirium due to her metabolic derangement and hospital stays -10/30/2022 CT brain negative -continue delirium precautions and frequent family visitation       Family Communication:  spouse updated 4/23   Consultants:  GI, neurology, renal   Code Status:  FULL    DVT Prophylaxis:  SCDs     Procedures: As Listed in Progress Note Above   Antibiotics: None   Subjective: Tolerating breakfast with no abdominal pain, last BM was several days ago, no emesis;   Objective: Vitals:   11/02/22 2046 11/02/22 2224 11/02/22 2350 11/03/22 0432  BP: (!) 134/43 (!) 154/52 (!) 145/55 (!) 146/85  Pulse: (!) 59 (!) 59 (!) 54 91  Resp: 12  18 18   Temp: 97.7 F (36.5 C)  97.8 F (36.6 C) 98.4 F (36.9  C)  TempSrc: Oral  Oral   SpO2: 100%  100% 99%  Weight:      Height:        Intake/Output Summary (Last 24 hours) at 11/03/2022 1424 Last data filed at 11/03/2022 0500 Gross per 24 hour  Intake 398.94 ml  Output 1700 ml  Net -1301.06 ml   Weight change: 0.3 kg Exam:  General:  Pt appears chronically ill and intermittently confused, eating breakfast, no distress noted HEENT: supple neck, No icterus, No thrush, No neck mass, [place]/AT Cardiovascular: normal S1/S2, no rubs, no gallops Respiratory: CTA bilaterally, no wheezing, no crackles, no rhonchi Abdomen: Soft/+BS, lower abd tender, non distended, no guarding Extremities: trace pretibial edema, No lymphangitis, No petechiae, No rashes, no synovitis  Data Reviewed: I have personally reviewed following labs and imaging studies Basic Metabolic Panel: Recent Labs  Lab 10/30/22 0546 10/30/22 1900 10/31/22 0541 11/01/22 1055 11/02/22 0537 11/03/22 1156  NA 137 133* 135 132* 131* 133*  K 5.1 5.0 4.6 3.9 3.9 3.6  CL 100 100 102 97* 98 97*  CO2 25 15* 19* 25 21* 26  GLUCOSE 89 193* 130* 87 103* 138*  BUN 44* 55* 61* 30* 34* 24*  CREATININE 5.88* 6.72* 7.24* 5.05* 5.97* 4.62*  CALCIUM 7.4* 7.4* 7.5* 7.2* 7.2* 7.4*  MG  --  1.8  --   --   --   --  PHOS 5.6*  --   --   --  5.3*  --    Liver Function Tests: Recent Labs  Lab 10/30/22 0546 10/30/22 1900 11/02/22 0537 11/03/22 1156  AST  --  15  --  12*  ALT  --  13  --  12  ALKPHOS  --  103  --  85  BILITOT  --  1.0  --  0.2*  PROT  --  6.6  --  5.9*  ALBUMIN 3.3* 3.4* 2.9* 3.0*   No results for input(s): "LIPASE", "AMYLASE" in the last 168 hours. No results for input(s): "AMMONIA" in the last 168 hours. Coagulation Profile: No results for input(s): "INR", "PROTIME" in the last 168 hours. CBC: Recent Labs  Lab 10/28/22 2129 10/29/22 1931 10/30/22 0546 10/30/22 1900 10/31/22 0541 11/02/22 0537 11/02/22 1643 11/03/22 1156  WBC 6.2   < > 6.2 8.4 8.1 9.6  --  5.4   NEUTROABS 4.6  --   --   --   --   --   --   --   HGB 7.1*   < > 10.6* 10.9* 10.1* 9.4* 10.3* 9.9*  HCT 22.1*   < > 32.5* 34.4* 30.8* 28.2* 30.3* 30.5*  MCV 120.8*   < > 105.9* 110.3* 104.4* 105.6*  --  108.9*  PLT 215   < > 192 188 191 151  --  180   < > = values in this interval not displayed.   Cardiac Enzymes: No results for input(s): "CKTOTAL", "CKMB", "CKMBINDEX", "TROPONINI" in the last 168 hours. BNP: Invalid input(s): "POCBNP" CBG: Recent Labs  Lab 10/30/22 1811 11/01/22 1704 11/02/22 2126  GLUCAP 126* 67* 137*   HbA1C: No results for input(s): "HGBA1C" in the last 72 hours. Urine analysis:    Component Value Date/Time   COLORURINE YELLOW 12/16/2012 1919   APPEARANCEUR CLOUDY (A) 12/16/2012 1919   LABSPEC 1.009 12/16/2012 1919   PHURINE 7.5 12/16/2012 1919   GLUCOSEU NEGATIVE 12/16/2012 1919   HGBUR TRACE (A) 12/16/2012 1919   BILIRUBINUR NEGATIVE 12/16/2012 1919   KETONESUR NEGATIVE 12/16/2012 1919   PROTEINUR 100 (A) 12/16/2012 1919   UROBILINOGEN 0.2 12/16/2012 1919   NITRITE NEGATIVE 12/16/2012 1919   LEUKOCYTESUR SMALL (A) 12/16/2012 1919   Recent Results (from the past 240 hour(s))  MRSA Next Gen by PCR, Nasal     Status: None   Collection Time: 10/30/22  6:52 PM   Specimen: Nasal Mucosa; Nasal Swab  Result Value Ref Range Status   MRSA by PCR Next Gen NOT DETECTED NOT DETECTED Final    Comment: (NOTE) The GeneXpert MRSA Assay (FDA approved for NASAL specimens only), is one component of a comprehensive MRSA colonization surveillance program. It is not intended to diagnose MRSA infection nor to guide or monitor treatment for MRSA infections. Test performance is not FDA approved in patients less than 3 years old. Performed at [facility], [address], [place], [place] [address]   Culture, blood (Routine X 2) w Reflex to ID Panel     Status: None (Preliminary result)   Collection Time: 11/02/22  7:24 PM   Specimen: BLOOD  Result Value Ref Range  Status   Specimen Description BLOOD BLOOD RIGHT HAND  Final   Special Requests   Final    BOTTLES DRAWN AEROBIC AND ANAEROBIC Blood Culture adequate volume   Culture   Final    NO GROWTH < 12 HOURS Performed at [facility], [address], [place], [place] [address]  Report Status PENDING  Incomplete  Culture, blood (Routine X 2) w Reflex to ID Panel     Status: None (Preliminary result)   Collection Time: 11/02/22  7:32 PM   Specimen: BLOOD  Result Value Ref Range Status   Specimen Description BLOOD BLOOD RIGHT ARM  Final   Special Requests   Final    BOTTLES DRAWN AEROBIC AND ANAEROBIC Blood Culture adequate volume   Culture   Final    NO GROWTH < 12 HOURS Performed at [facility], [address] [place], [place] [address]    Report Status PENDING  Incomplete     Scheduled Meds:  amiodarone  100 mg Oral Daily   calcitRIOL  1.75 mcg Oral Q M,W,F-HD   Chlorhexidine Gluconate Cloth  6 each Topical Daily   Chlorhexidine Gluconate Cloth  6 each Topical Q0600   cinacalcet  30 mg Oral Q M,W,F-HD   levETIRAcetam  500 mg Oral Daily   levothyroxine  75 mcg Oral QAC breakfast   lisinopril  20 mg Oral Daily   magic mouthwash  5 mL Oral TID   metoprolol tartrate  12.5 mg Oral BID   octreotide  100 mcg Subcutaneous Q12H   rosuvastatin  10 mg Oral Daily   Continuous Infusions:  anticoagulant sodium citrate     piperacillin-tazobactam (ZOSYN)  IV      Procedures/Studies: CT ABDOMEN PELVIS WO CONTRAST  Result Date: 11/02/2022 CLINICAL DATA:  Acute abdominal pain after endoscopy EXAM: CT ABDOMEN AND PELVIS WITHOUT CONTRAST TECHNIQUE: Multidetector CT imaging of the abdomen and pelvis was performed following the standard protocol without IV contrast. RADIATION DOSE REDUCTION: This exam was performed according to the departmental dose-optimization program which includes automated exposure control, adjustment of the mA and/or kV according to patient size and/or use of iterative  reconstruction technique. COMPARISON:  CT 04/06/2020 without contrast FINDINGS: Lower chest: Small bilateral pleural effusions identified with some adjacent opacities. Atelectasis is favored over infiltrate recommend follow-up. The heart is enlarged. Status post median sternotomy. Coronary artery calcifications are seen. Hepatobiliary: On this non IV contrast exam, the liver has some small calcifications, possibly related to old granulomatous disease. However the gallbladder is distended with some slight wall thickening and stranding. There is also some luminal high density. Sludge or stones are possible. If there is concern further of acute cholecystitis an ultrasound may be of some benefit as the next step in the workup. Pancreas: Moderate atrophy of the pancreas.  No obvious mass. Spleen: Spleen is nonenlarged. Adrenals/Urinary Tract: The adrenal glands are grossly preserved. There is severe atrophy of the kidneys with several vascular calcifications and tiny cystic areas. The more complex hyperdense area along the upper pole of the right kidney is stable. Previously measuring 2.8 x 2.0 cm and today 2.5 by 1.5 cm. Contracted urinary bladder. Stomach/Bowel: There is some fluid and debris in the stomach. Moderate diffuse scattered colonic stool. Left-sided colonic diverticulosis. Surgical changes along the rectosigmoid colon. No obstruction. Surgical changes as well along loops of bowel in the right lower quadrant. The appendix is not well seen. There are several loops of small bowel in the midabdomen which have air-fluid levels and are mildly dilated measuring up to 3.2 cm. No abrupt caliber change. Vascular/Lymphatic: Extensive vascular calcifications. Normal caliber aorta and IVC. No specific abnormal lymph node enlargement identified in the abdomen and pelvis. Reproductive: Status post hysterectomy. No adnexal masses. Other: Anasarca. No obstruction, free air or free fluid. Mild areas of mesenteric stranding  and presacral  space stranding. Musculoskeletal: Osteopenia. Scattered degenerative changes. Schmorl's node changes along lumbar spine and thoracic spine. IMPRESSION: Once again severely atrophic kidneys with calcifications and cystic areas including complex area along the upper pole of the right kidney which is stable in size. Recommend continued surveillance. Developing small bilateral pleural effusion with adjacent lung opacities. Atelectasis versus infiltrate. Recommend follow-up. Increasing anasarca mesenteric stranding. There are some mildly dilated loops of small bowel with some air-fluid levels but no abrupt transition. Ileus versus mild partial obstruction is in the differential recommend close follow-up. Colonic diverticulosis. Distended gallbladder with wall thickening and some stranding. In addition there is some high attenuation material in the gallbladder. Please correlate with any sludge or stones. Please correlate with any prior contrast administration. Overall recommend further workup with ultrasound to further delineate when able. Electronically Signed   By: Karen Kays M.D.   On: 11/02/2022 14:27   MR BRAIN WO CONTRAST  Result Date: 10/31/2022 CLINICAL DATA:  Altered mental status EXAM: MRI HEAD WITHOUT CONTRAST TECHNIQUE: Multiplanar, multiecho pulse sequences of the brain and surrounding structures were obtained without intravenous contrast. COMPARISON:  04/23/2022 MRI, correlation is also made with 10/30/2022 CT head FINDINGS: Brain: No restricted diffusion to suggest acute or subacute infarct. No acute hemorrhage, mass, or midline shift. No hydrocephalus. 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 Normal pituitary and craniocervical junction. Remote lacunar infarcts in the bilateral basal ganglia and left cerebellum. Punctate foci of hemosiderin deposition in the  right cerebellum, right campus, and right frontal and parietal lobes, possibly sequela of prior microhemorrhage. No superficial siderosis. Confluent T2 hyperintense signal in the periventricular white matter, likely the sequela of moderate chronic small vessel ischemic disease. Vascular: Normal arterial flow voids. Skull and upper cervical spine: Normal marrow signal. Sinuses/Orbits: Mucosal thickening in the left sphenoid sinus. No acute finding in the orbits. Other: Trace fluid in the mastoid air cells. IMPRESSION: 1. No acute intracranial process. No evidence of acute or subacute infarct. 2. 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 Electronically Signed   By: Wiliam Ke M.D.   On: 10/31/2022 19:31   EEG adult  Result Date: 10/31/2022 Charlsie Quest, MD     10/31/2022  2:57 PM Patient Name: CHAVY AVERA MRN: [id number] Epilepsy Attending: Charlsie Quest Referring Physician/Provider: Rejeana Brock, MD Date:10/31/2022 Duration: 22.39 mins Patient history: 82yo F getting eeg to evaluate for seizure Level of alertness: Awake, asleep AEDs during EEG study: LEV Technical aspects: This EEG study was done with scalp electrodes positioned according to the 10-20 International system of electrode placement. Electrical activity was reviewed with band pass filter of 1-70Hz , sensitivity of 7 uV/mm, display speed of 37mm/sec with a 60Hz  notched filter applied as appropriate. EEG data were recorded continuously and digitally stored.  Video monitoring was available and reviewed as appropriate. Description: The posterior dominant rhythm consists of 8-9 Hz activity of moderate voltage (25-35 uV) seen predominantly in posterior head regions, symmetric and reactive to eye opening and eye closing. Sleep was characterized by vertex waves, sleep spindles (12 to 14 Hz), maximal frontocentral region.  EEG showed intermittent generalized 3 to 6 Hz  theta-delta slowing. Hyperventilation and photic stimulation were not performed.   ABNORMALITY - Intermittent slow, generalized IMPRESSION: This study is suggestive  of mild diffuse encephalopathy. No seizures or epileptiform discharges were seen throughout the recording. Charlsie Quest   DG Abd 1 View  Result Date: 10/31/2022 CLINICAL DATA:  Abdominal pain EXAM: ABDOMEN - 1 VIEW COMPARISON:  08/29/2022 FINDINGS: Bowel gas pattern is nonspecific. Gas and stool are noted in colon. Small amount of stool is seen in colon. There are surgical staples and clips in lower abdomen and pelvis. There is no identifiable electronic device in abdomen and pelvis in the current study. In the previous examination, an electronic device, capsule most noted in region of rectum suggesting interval passage. Arterial calcifications are seen. IMPRESSION: Nonspecific bowel gas pattern. There is no evidence of any metallic electronic capsule in the abdomen and pelvis. Electronically Signed   By: Ernie Avena M.D.   On: 10/31/2022 08:40   CT HEAD CODE STROKE WO CONTRAST  Result Date: 10/30/2022 CLINICAL DATA:  Code stroke.  Seizure, altered mental status. EXAM: CT HEAD WITHOUT CONTRAST TECHNIQUE: Contiguous axial images were obtained from the base of the skull through the vertex without intravenous contrast. RADIATION DOSE REDUCTION: This exam was performed according to the departmental dose-optimization program which includes automated exposure control, adjustment of the mA and/or kV according to patient size and/or use of iterative reconstruction technique. COMPARISON:  CT head 08/26/2022. FINDINGS: Brain: There is no evidence of acute territorial infarct. There are low-density subdural collections overlying both cerebral hemispheres measuring up to 6 mm on the left and 3 mm on the right without evidence of acute blood products, new since the prior head CT from 08/26/2022. There is no mass effect on the underlying brain  parenchyma. Parenchymal volume loss with prominence of the ventricular system and extra-axial CSF spaces is stable. The ventricles are unchanged in size and configuration. A remote infarct in the left basal ganglia is unchanged. Additional confluent hypodensity in the supratentorial white matter likely reflects sequela of underlying chronic small-vessel ischemic change. The pituitary and suprasellar region are normal. There is no mass lesion. There is no midline shift. Vascular: There is calcification of the bilateral carotid siphons and vertebral arteries. Skull: Normal. Negative for fracture or focal lesion. Sinuses/Orbits: The paranasal sinuses are clear. The globes and orbits are unremarkable. Other: None. ASPECTS [facility] Stroke Program Early CT Score) - Ganglionic level infarction (caudate, lentiform nuclei, internal capsule, insula, M1-M3 cortex): 7 - Supraganglionic infarction (M4-M6 cortex): 3 Total score (0-10 with 10 being normal): 10 IMPRESSION: 1. No evidence of acute intracranial hemorrhage or acute territorial infarct. 2. Low-density subdural hygromas overlying both cerebral hemispheres measuring up to 6 mm on the left and 3 mm on the right, new since the ct head from 08/26/2022 but without evidence of acute blood within the collections. These results communicated via Amion at the time of interpretation on 10/30/2022 at 6:44 pm to provider [facility] , who verbally acknowledged these results. Electronically Signed   By: Lesia Hausen M.D.   On: 10/30/2022 18:46   DG Chest Port 1 View  Result Date: 10/28/2022 CLINICAL DATA:  Cough EXAM: PORTABLE CHEST 1 VIEW COMPARISON:  10/23/2022 FINDINGS: Prior CABG. Left dialysis catheter remains in place with the tip in the right atrium. Mild cardiomegaly. No overt edema. Bibasilar atelectasis. No effusions or acute bony abnormality. IMPRESSION: Cardiomegaly, bibasilar atelectasis. Electronically Signed   By: Charlett Nose M.D.   On: 10/28/2022 22:15   CT  Chest Wo Contrast  Result Date: 10/23/2022 CLINICAL DATA:  Pneumonia, complication suspected, xray done EXAM: CT CHEST WITHOUT CONTRAST  TECHNIQUE: Multidetector CT imaging of the chest was performed following the standard protocol without IV contrast. RADIATION DOSE REDUCTION: This exam was performed according to the departmental dose-optimization program which includes automated exposure control, adjustment of the mA and/or kV according to patient size and/or use of iterative reconstruction technique. COMPARISON:  Radiograph earlier today. Additional prior radiographs reviewed. Most recent chest CT available 07/16/2021 FINDINGS: Cardiovascular: Left-sided dialysis catheter tip is in the atrial caval junction. Mild cardiomegaly. Post CABG with calcification of native coronary arteries. Decreased density of the blood pool typical of anemia. Advanced aortic atherosclerosis with aortic tortuosity. No aneurysm or periaortic stranding. No pericardial effusion. Mediastinum/Nodes: 14 mm lower anterior paratracheal node, similar to 2023 exam. Scattered additional shoddy mediastinal lymph nodes are also unchanged. No progressive adenopathy. Patulous esophagus. Lungs/Pleura: Small bilateral pleural effusions, left slightly larger than right. Slight vascular congestion. Minimal fluid in the fissures. Mild patchy ground-glass opacity in the dependent left greater than right lower lobe. Moderate central bronchial thickening which may be congestive or bronchitic. no endobronchial debris. No pulmonary mass. Upper Abdomen: Sequela of chronic renal disease with renal atrophy and cysts. No specific imaging follow-up of these cysts is recommended. A peripherally calcified density in the right upper kidney is stable from 2023 exam and consistent with a complex cyst, also needing no further follow-up. Left colonic diverticulosis. Musculoskeletal: Exaggerated thoracic kyphosis with degenerative change throughout the thoracic spine.  Chronic mild L1 compression deformity. Slight increase in Schmorl's node inferior endplate of T11. Prior median sternotomy. There are no acute or suspicious osseous abnormalities. IMPRESSION: 1. Small bilateral pleural effusions, left slightly larger than right. Slight vascular congestion. 2. Mild patchy ground-glass opacity in the dependent left greater than right lower lobe may be atelectasis or pneumonia. 3. Moderate central bronchial thickening may be congestive or bronchitic. Aortic Atherosclerosis (ICD10-I70.0). Electronically Signed   By: Narda Rutherford M.D.   On: 10/23/2022 15:35   DG Chest Port 1 View  Result Date: 10/23/2022 CLINICAL DATA:  82 year old female with cough. Productive cough for 2 weeks. Some shortness of breath. EXAM: PORTABLE CHEST 1 VIEW COMPARISON:  Chest radiographs 10/02/2022 and earlier. FINDINGS: Portable AP upright view at 1107 hours. Prior CABG. Stable cardiomegaly and mediastinal contours. Calcified aortic atherosclerosis. Stable left chest dual lumen dialysis type catheter. Patchy, nonspecific increased left lung base opacity partially obscuring the diaphragm now. No superimposed pneumothorax or consolidation. Stable pulmonary vascularity without overt edema. No confluent right lung opacity. Stable visualized osseous structures.  Negative visible bowel gas. IMPRESSION: 1. Patchy, nonspecific increased left lung base opacity new since last month. PA and lateral views may be helpful if feasible. 2. Otherwise stable cardiomegaly, pulmonary vascularity, prior CABG, chest dialysis catheter, Aortic Atherosclerosis. Electronically Signed   By: Odessa Fleming M.D.   On: 10/23/2022 11:16    Standley Dakins, MD  [facility]  If 7PM-7AM, please contact night-coverage [email or web address] Password [email or web address] 11/03/2022, 2:24 PM   LOS: 5 days

## 2022-11-04 ENCOUNTER — Encounter (HOSPITAL_COMMUNITY): Payer: Self-pay | Admitting: Gastroenterology

## 2022-11-04 ENCOUNTER — Other Ambulatory Visit (INDEPENDENT_AMBULATORY_CARE_PROVIDER_SITE_OTHER): Payer: Self-pay

## 2022-11-04 DIAGNOSIS — K5520 Angiodysplasia of colon without hemorrhage: Secondary | ICD-10-CM | POA: Diagnosis not present

## 2022-11-04 DIAGNOSIS — K567 Ileus, unspecified: Secondary | ICD-10-CM

## 2022-11-04 DIAGNOSIS — R1033 Periumbilical pain: Secondary | ICD-10-CM

## 2022-11-04 DIAGNOSIS — D62 Acute posthemorrhagic anemia: Secondary | ICD-10-CM | POA: Diagnosis not present

## 2022-11-04 LAB — RENAL FUNCTION PANEL
Albumin: 2.7 g/dL — ABNORMAL LOW (ref 3.5–5.0)
Anion gap: 12 (ref 5–15)
BUN: 35 mg/dL — ABNORMAL HIGH (ref 8–23)
CO2: 23 mmol/L (ref 22–32)
Calcium: 7.3 mg/dL — ABNORMAL LOW (ref 8.9–10.3)
Chloride: 97 mmol/L — ABNORMAL LOW (ref 98–111)
Creatinine, Ser: 5.67 mg/dL — ABNORMAL HIGH (ref 0.44–1.00)
GFR, Estimated: 7 mL/min — ABNORMAL LOW (ref >60)
Glucose, Bld: 74 mg/dL (ref 70–99)
Phosphorus: 5.6 mg/dL — ABNORMAL HIGH (ref 2.5–4.6)
Potassium: 4.4 mmol/L (ref 3.5–5.1)
Sodium: 132 mmol/L — ABNORMAL LOW (ref 135–145)

## 2022-11-04 LAB — HEPATIC FUNCTION PANEL
ALT: 11 U/L (ref 0–44)
AST: 11 U/L — ABNORMAL LOW (ref 15–41)
Albumin: 2.7 g/dL — ABNORMAL LOW (ref 3.5–5.0)
Alkaline Phosphatase: 74 U/L (ref 38–126)
Bilirubin, Direct: 0.2 mg/dL (ref 0.0–0.2)
Indirect Bilirubin: 0.6 mg/dL (ref 0.3–0.9)
Total Bilirubin: 0.8 mg/dL (ref 0.3–1.2)
Total Protein: 5.3 g/dL — ABNORMAL LOW (ref 6.5–8.1)

## 2022-11-04 LAB — SURGICAL PATHOLOGY

## 2022-11-04 LAB — CBC
HCT: 28.2 % — ABNORMAL LOW (ref 36.0–46.0)
Hemoglobin: 9.2 g/dL — ABNORMAL LOW (ref 12.0–15.0)
MCH: 35.1 pg — ABNORMAL HIGH (ref 26.0–34.0)
MCHC: 32.6 g/dL (ref 30.0–36.0)
MCV: 107.6 fL — ABNORMAL HIGH (ref 80.0–100.0)
Platelets: 183 10*3/uL (ref 150–400)
RBC: 2.62 MIL/uL — ABNORMAL LOW (ref 3.87–5.11)
RDW: 25.7 % — ABNORMAL HIGH (ref 11.5–15.5)
WBC: 5.6 10*3/uL (ref 4.0–10.5)
nRBC: 0 % (ref 0.0–0.2)

## 2022-11-04 MED ORDER — POLYETHYLENE GLYCOL 3350 17 G PO PACK
17.0000 g | PACK | Freq: Three times a day (TID) | ORAL | Status: DC
  Administered 2022-11-04 – 2022-11-13 (×13): 17 g via ORAL
  Filled 2022-11-04 (×22): qty 1

## 2022-11-04 MED ORDER — HEPARIN SODIUM (PORCINE) 1000 UNIT/ML IJ SOLN
INTRAMUSCULAR | Status: AC
  Filled 2022-11-04: qty 6

## 2022-11-04 NOTE — Progress Notes (Signed)
   HEMODIALYSIS TREATMENT NOTE:  Uneventful 3.5 hour heparin-free treatment completed using [facility] TDC. Goal met: 2 liters removed without interruption in UF.  All blood was returned.  Post-dialysis:  11/04/22 1730  Vital Signs  Temp 97.8 F (36.6 C)  Temp Source Oral  Pulse Rate (!) 54  Pulse Rate Source Monitor  Resp 18  BP (!) 164/59  BP Location Right Arm  BP Method Automatic  Patient Position (if appropriate) Lying  Oxygen Therapy  SpO2 100 %  O2 Device Room Air  Pain Assessment  Pain Score 0  Dialysis Weight  Weight 56.6 kg  Type of Weight Post-Dialysis  Post Treatment  Dialyzer Clearance Heavily streaked  Duration of HD Treatment -hour(s) 3.5 hour(s)  Hemodialysis Intake (mL) 0 mL  Liters Processed 70.3  Fluid Removed (mL) 2000 mL  Tolerated HD Treatment Yes  Post-Hemodialysis Comments Goal met    Pt was transported from [facility] back to [facility].  Bedside hand-off given to Charna Elizabeth, RN   Arman Filter, RN [facility]

## 2022-11-04 NOTE — Telephone Encounter (Signed)
11/04/2022: Patient still inpatient will send when patient discharged.

## 2022-11-04 NOTE — Progress Notes (Signed)
Pt A&O x 3. Pt reported 10/10 pain and cramping in lower abdomen, PRN Dilaudid given.

## 2022-11-04 NOTE — Care Management Important Message (Signed)
Important Message  Patient Details  Name: Shawna [name] MRN: [id number] Date of Birth: [birth year]   Medicare Important Message Given:  Yes     [name] 11/04/2022, 11:31 AM

## 2022-11-04 NOTE — Progress Notes (Signed)
PROGRESS NOTE  CLARIBEL SACHS [name]:[id number] DOB: 04/05/1941 DOA: 10/28/2022 PCP: [facility]  Brief History:  82 year old female with a history of ESRD (MWF), coronary artery disease status post CABG, CHF, hypertension, hyperlipidemia, paroxysmal atrial fibrillation, hyperlipidemia, GI bleed presenting with generalized weakness and not feeling well after dialysis. At the time of admission, the patient was noted to have a hemoglobin of 7.1.  She was transfused 2 units PRBC.  GI was consulted to assist with management. Unfortunately, the patient had a rapid response called on the evening of 10/30/2022 secondary to altered mental status.  Apparently, the patient was noted to have seizure-like activity by nursing staff.  Apparently the patient was lethargic minimally responsive to painful stimuli.  There were shaking activity lasting 3 minutes noted by nursing staff.  Code stroke was activated.  CT of the brain was negative for hemorrhage.  There was some low-density subdural hygromas.  The patient was seen by teleneurology.  CTA of the head and neck was recommended, but family refused secondary to the patient's contrast allergy.  The patient was loaded with Keppra.  EEG was ordered.  They felt that the patient could stay at [facility] as long as she improves and follows commands.  She returns her to the ICU.  As the evening progressed, the patient was more responsive.  Apparently, the patient was refusing to follow commands but would speak when probed and speech was clear.  The patient did follow some simple commands.  The patient had recent hospitalization from 08/26/22 to 08/29/22 secondary to acute on chronic blood loss anemia.  Hemoccult was positive at that time.  The patmultiple gastric AVMs treated with APC, small hiatal hernia, no active or stigmata of bleeding.  Capsule was placed placed during EGD and showed few small AVMs in the small bowel, likely jejunum.  2 small polyps in  the distal small bowel previously noted. Capsule appears to sit at the ileocolonic anastomosis for the last 2 to 3 hours of study.  She was transfused 2 units PRBC during that hospital admission.  She is not felt to be a candidate for anticoagulation due to her recurrent GI bleeds.  The patient was noted to have hospital delirium and felt to have a degree of underlying cognitive impairment.  She also had a previous hospital admission from 08/06/2022 to 08/17/2022 due to acute on chronic encephalopathy during the hospitalization.  At best, it appears that the patient was oriented only to person at that time.  Family has noted that the patient has had confusion at home since wintertime 2023.  Once again, she had acute on chronic anemia.  She underwent EGD and colonoscopy on 08/10/2022.duodenal and colonic polyps which were removed. 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  She was seen by palliative medicine and full scope of care was continued.   Assessment/Plan: Acute on chronic blood loss anemia -GI consult appreciated -Transfused 1 unit PRBC this admission -Presented with hemoglobin 7.1 -Hemoglobin remained stable since transfusion -Blood loss felt to be secondary to the patient's gastric and small bowel AVMs -4/23 SB enteroscopy--nonbleeding gastric ulcer with clean base; non bleeding jejunal diverticula; no AVMs -she is treated with BID octreotide injections -Hgb remains largely stable around 8    Myoclonus/Seizure like activity -The patient has had similar episodes of what was described on the evening of 10/30/2022 in the past previously felt to be secondary to anemia and gabapentin in the setting of ESRD patient -  Appreciate neurology consult>>loaded keppra -Continue Keppra per neurologist - no further episodes on keppra  -10/31/22 AM-pt is lucid, A&Ox2, follows commands -11/01/22--pt remains lucid -11/02/22--lucid without any seizure like activity  Abdominal Pain -appears to be lower abd -CT abd/pelvis -  suggesting mild ileus and cholelithiasis  -[place] RUQ pending for further gallbladder work up - may need HIDA scan -tolerating diet, but endorses post-prandial pain -laxatives added for constipation / mild ileus  -will ask for surgery eval   ESRD -Dialyzes Monday, Wednesday, Friday -last dialysis 11/02/2022 -Appreciate nephrology follow-up   Paroxysmal Atrial fibrillation/SVT -Not a candidate for anticoagulation secondary to recurrent GI bleed -Continue amiodarone and metoprolol  -currently in sinus, but has intermittent episodes of SVT   Essential hypertension -Continue home medications-metoprolol and lisinopril -Well-documented that the patient has had uncontrolled hypertension   Coronary Artery Disease s/p CABG in 2013 with DES to D1 in 10/2016 with cath showing patent LIMA-LAD, SVG-RI, SVG-Mrg and SVG-RCA and repeat cath in 07/2018 showing patent grafts with occlusion of D1 at prior stent site and progression of PDA disease and medical management was recommended -continue imdur, ASA 81 mg, simavastatin   Hypothyroidism -Continue home dose levothyroxine     Cognitive impairment -Documented over the last several hospitalization the patient has had baseline cognitive impairment and hospital delirium -She is at high risk for recurrent delirium due to her metabolic derangement and hospital stays -10/30/2022 CT brain negative -continue delirium precautions and frequent family visitation       Family Communication:  spouse updated 4/23   Consultants:  GI, neurology, renal   Code Status:  FULL    DVT Prophylaxis:  SCDs     Procedures: As Listed in Progress Note Above   Antibiotics: None   Subjective: Pt still having abdominal pain and able to eat and tolerate meal but still having pain.    Objective: Vitals:   11/04/22 1600 11/04/22 1630 11/04/22 1700 11/04/22 1730  BP: (!) 155/73 (!) 170/67 (!) 180/63 (!) 164/59  Pulse:    (!) 54  Resp:  20 18 18   Temp:    97.8 F  (36.6 C)  TempSrc:    Oral  SpO2:    100%  Weight:    56.6 kg  Height:        Intake/Output Summary (Last 24 hours) at 11/04/2022 1841 Last data filed at 11/04/2022 1730 Gross per 24 hour  Intake 480 ml  Output 2000 ml  Net -1520 ml   Weight change:  Exam:  General:  Pt appears chronically ill and intermittently confused, eating breakfast, no distress noted HEENT: supple neck, No icterus, No thrush, No neck mass, [place]/AT Cardiovascular: normal S1/S2, no rubs, no gallops Respiratory: CTA bilaterally, no wheezing, no crackles, no rhonchi Abdomen: Soft/+BS, lower abd tender, non distended, no guarding Extremities: trace pretibial edema, No lymphangitis, No petechiae, No rashes, no synovitis  Data Reviewed: I have personally reviewed following labs and imaging studies Basic Metabolic Panel: Recent Labs  Lab 10/30/22 0546 10/30/22 1900 10/31/22 0541 11/01/22 1055 11/02/22 0537 11/03/22 1156 11/04/22 0351  NA 137 133* 135 132* 131* 133* 132*  K 5.1 5.0 4.6 3.9 3.9 3.6 4.4  CL 100 100 102 97* 98 97* 97*  CO2 25 15* 19* 25 21* 26 23  GLUCOSE 89 193* 130* 87 103* 138* 74  BUN 44* 55* 61* 30* 34* 24* 35*  CREATININE 5.88* 6.72* 7.24* 5.05* 5.97* 4.62* 5.67*  CALCIUM 7.4* 7.4* 7.5* 7.2* 7.2* 7.4* 7.3*  MG  --  1.8  --   --   --   --   --   PHOS 5.6*  --   --   --  5.3*  --  5.6*   Liver Function Tests: Recent Labs  Lab 10/30/22 0546 10/30/22 1900 11/02/22 0537 11/03/22 1156 11/04/22 0351  AST  --  15  --  12* 11*  ALT  --  13  --  12 11  ALKPHOS  --  103  --  85 74  BILITOT  --  1.0  --  0.2* 0.8  PROT  --  6.6  --  5.9* 5.3*  ALBUMIN 3.3* 3.4* 2.9* 3.0* 2.7*  2.7*   No results for input(s): "LIPASE", "AMYLASE" in the last 168 hours. No results for input(s): "AMMONIA" in the last 168 hours. Coagulation Profile: No results for input(s): "INR", "PROTIME" in the last 168 hours. CBC: Recent Labs  Lab 10/28/22 2129 10/29/22 1931 10/30/22 1900 10/31/22 0541  11/02/22 0537 11/02/22 1643 11/03/22 1156 11/04/22 0351  WBC 6.2   < > 8.4 8.1 9.6  --  5.4 5.6  NEUTROABS 4.6  --   --   --   --   --   --   --   HGB 7.1*   < > 10.9* 10.1* 9.4* 10.3* 9.9* 9.2*  HCT 22.1*   < > 34.4* 30.8* 28.2* 30.3* 30.5* 28.2*  MCV 120.8*   < > 110.3* 104.4* 105.6*  --  108.9* 107.6*  PLT 215   < > 188 191 151  --  180 183   < > = values in this interval not displayed.   Cardiac Enzymes: No results for input(s): "CKTOTAL", "CKMB", "CKMBINDEX", "TROPONINI" in the last 168 hours. BNP: Invalid input(s): "POCBNP" CBG: Recent Labs  Lab 10/30/22 1811 11/01/22 1704 11/02/22 2126 11/03/22 2020  GLUCAP 126* 67* 137* 200*   HbA1C: No results for input(s): "HGBA1C" in the last 72 hours. Urine analysis:    Component Value Date/Time   COLORURINE YELLOW 12/16/2012 1919   APPEARANCEUR CLOUDY (A) 12/16/2012 1919   LABSPEC 1.009 12/16/2012 1919   PHURINE 7.5 12/16/2012 1919   GLUCOSEU NEGATIVE 12/16/2012 1919   HGBUR TRACE (A) 12/16/2012 1919   BILIRUBINUR NEGATIVE 12/16/2012 1919   KETONESUR NEGATIVE 12/16/2012 1919   PROTEINUR 100 (A) 12/16/2012 1919   UROBILINOGEN 0.2 12/16/2012 1919   NITRITE NEGATIVE 12/16/2012 1919   LEUKOCYTESUR SMALL (A) 12/16/2012 1919   Recent Results (from the past 240 hour(s))  MRSA Next Gen by PCR, Nasal     Status: None   Collection Time: 10/30/22  6:52 PM   Specimen: Nasal Mucosa; Nasal Swab  Result Value Ref Range Status   MRSA by PCR Next Gen NOT DETECTED NOT DETECTED Final    Comment: (NOTE) The GeneXpert MRSA Assay (FDA approved for NASAL specimens only), is one component of a comprehensive MRSA colonization surveillance program. It is not intended to diagnose MRSA infection nor to guide or monitor treatment for MRSA infections. Test performance is not FDA approved in patients less than 11 years old. Performed at [facility], [address] [place], [place] [address]   Culture, blood (Routine X 2) w Reflex to ID Panel      Status: None (Preliminary result)   Collection Time: 11/02/22  7:24 PM   Specimen: BLOOD  Result Value Ref Range Status   Specimen Description BLOOD BLOOD RIGHT HAND  Final   Special Requests   Final    BOTTLES DRAWN AEROBIC  AND ANAEROBIC Blood Culture adequate volume   Culture   Final    NO GROWTH 2 DAYS Performed at [facility], [address] [place], [place] [address]    Report Status PENDING  Incomplete  Culture, blood (Routine X 2) w Reflex to ID Panel     Status: None (Preliminary result)   Collection Time: 11/02/22  7:32 PM   Specimen: BLOOD  Result Value Ref Range Status   Specimen Description BLOOD BLOOD RIGHT ARM  Final   Special Requests   Final    BOTTLES DRAWN AEROBIC AND ANAEROBIC Blood Culture adequate volume   Culture   Final    NO GROWTH 2 DAYS Performed at [facility], [address], [place], [place] [address]    Report Status PENDING  Incomplete     Scheduled Meds:  amiodarone  100 mg Oral Daily   calcitRIOL  1.75 mcg Oral Q M,W,F-HD   Chlorhexidine Gluconate Cloth  6 each Topical Daily   Chlorhexidine Gluconate Cloth  6 each Topical Q0600   cinacalcet  30 mg Oral Q M,W,F-HD   levETIRAcetam  500 mg Oral Daily   levothyroxine  75 mcg Oral QAC breakfast   lisinopril  20 mg Oral Daily   magic mouthwash  5 mL Oral TID   metoprolol tartrate  12.5 mg Oral BID   octreotide  100 mcg Subcutaneous Q12H   polyethylene glycol  17 g Oral TID   rosuvastatin  10 mg Oral Daily   Continuous Infusions:  anticoagulant sodium citrate     piperacillin-tazobactam (ZOSYN)  IV 2.25 g (11/04/22 1258)    Procedures/Studies: US Abdomen Limited RUQ (LIVER/GB)  Result Date: 11/03/2022 CLINICAL DATA:  Right upper quadrant pain EXAM: ULTRASOUND ABDOMEN LIMITED RIGHT UPPER QUADRANT COMPARISON:  CT 11/02/2022.  Older exams as well. FINDINGS: Gallbladder: Gallbladder is mildly distended. No shadowing stones or adjacent fluid but there is gallbladder wall thickening. Common  bile duct: Diameter: 4 mm Liver: No focal lesion identified. Within normal limits in parenchymal echogenicity. Portal vein is patent on color Doppler imaging with normal direction of blood flow towards the liver. Other: None. IMPRESSION: No shadowing stones. The gallbladder wall is thickened which is nonspecific. No adjacent fluid. No reported sonographic Murphy sign. Please correlate with clinical symptomatology. If there is further concern, HIDA scan could be considered as clinically appropriate. No biliary ductal dilatation Electronically Signed   By: Karen Kays M.D.   On: 11/03/2022 14:59   CT ABDOMEN PELVIS WO CONTRAST  Result Date: 11/02/2022 CLINICAL DATA:  Acute abdominal pain after endoscopy EXAM: CT ABDOMEN AND PELVIS WITHOUT CONTRAST TECHNIQUE: Multidetector CT imaging of the abdomen and pelvis was performed following the standard protocol without IV contrast. RADIATION DOSE REDUCTION: This exam was performed according to the departmental dose-optimization program which includes automated exposure control, adjustment of the mA and/or kV according to patient size and/or use of iterative reconstruction technique. COMPARISON:  CT 04/06/2020 without contrast FINDINGS: Lower chest: Small bilateral pleural effusions identified with some adjacent opacities. Atelectasis is favored over infiltrate recommend follow-up. The heart is enlarged. Status post median sternotomy. Coronary artery calcifications are seen. Hepatobiliary: On this non IV contrast exam, the liver has some small calcifications, possibly related to old granulomatous disease. However the gallbladder is distended with some slight wall thickening and stranding. There is also some luminal high density. Sludge or stones are possible. If there is concern further of acute cholecystitis an ultrasound may be of some benefit as the next step  in the workup. Pancreas: Moderate atrophy of the pancreas.  No obvious mass. Spleen: Spleen is nonenlarged.  Adrenals/Urinary Tract: The adrenal glands are grossly preserved. There is severe atrophy of the kidneys with several vascular calcifications and tiny cystic areas. The more complex hyperdense area along the upper pole of the right kidney is stable. Previously measuring 2.8 x 2.0 cm and today 2.5 by 1.5 cm. Contracted urinary bladder. Stomach/Bowel: There is some fluid and debris in the stomach. Moderate diffuse scattered colonic stool. Left-sided colonic diverticulosis. Surgical changes along the rectosigmoid colon. No obstruction. Surgical changes as well along loops of bowel in the right lower quadrant. The appendix is not well seen. There are several loops of small bowel in the midabdomen which have air-fluid levels and are mildly dilated measuring up to 3.2 cm. No abrupt caliber change. Vascular/Lymphatic: Extensive vascular calcifications. Normal caliber aorta and IVC. No specific abnormal lymph node enlargement identified in the abdomen and pelvis. Reproductive: Status post hysterectomy. No adnexal masses. Other: Anasarca. No obstruction, free air or free fluid. Mild areas of mesenteric stranding and presacral space stranding. Musculoskeletal: Osteopenia. Scattered degenerative changes. Schmorl's node changes along lumbar spine and thoracic spine. IMPRESSION: Once again severely atrophic kidneys with calcifications and cystic areas including complex area along the upper pole of the right kidney which is stable in size. Recommend continued surveillance. Developing small bilateral pleural effusion with adjacent lung opacities. Atelectasis versus infiltrate. Recommend follow-up. Increasing anasarca mesenteric stranding. There are some mildly dilated loops of small bowel with some air-fluid levels but no abrupt transition. Ileus versus mild partial obstruction is in the differential recommend close follow-up. Colonic diverticulosis. Distended gallbladder with wall thickening and some stranding. In addition there  is some high attenuation material in the gallbladder. Please correlate with any sludge or stones. Please correlate with any prior contrast administration. Overall recommend further workup with ultrasound to further delineate when able. Electronically Signed   By: Karen Kays M.D.   On: 11/02/2022 14:27   MR BRAIN WO CONTRAST  Result Date: 10/31/2022 CLINICAL DATA:  Altered mental status EXAM: MRI HEAD WITHOUT CONTRAST TECHNIQUE: Multiplanar, multiecho pulse sequences of the brain and surrounding structures were obtained without intravenous contrast. COMPARISON:  04/23/2022 MRI, correlation is also made with 10/30/2022 CT head FINDINGS: Brain: No restricted diffusion to suggest acute or subacute infarct. No acute hemorrhage, mass, or midline shift. No hydrocephalus. 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 Normal pituitary and craniocervical junction. Remote lacunar infarcts in the bilateral basal ganglia and left cerebellum. Punctate foci of hemosiderin deposition in the right cerebellum, right campus, and right frontal and parietal lobes, possibly sequela of prior microhemorrhage. No superficial siderosis. Confluent T2 hyperintense signal in the periventricular white matter, likely the sequela of moderate chronic small vessel ischemic disease. Vascular: Normal arterial flow voids. Skull and upper cervical spine: Normal marrow signal. Sinuses/Orbits: Mucosal thickening in the left sphenoid sinus. No acute finding in the orbits. Other: Trace fluid in the mastoid air cells. IMPRESSION: 1. No acute intracranial process. No evidence of acute or subacute infarct. 2. 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 Electronically Signed   By: Wiliam Ke M.D.   On: 10/31/2022 19:31   EEG adult  Result Date:  10/31/2022 Charlsie Quest, MD  10/31/2022  2:57 PM Patient Name: Shawna [name] MRN: [id number] Epilepsy Attending: Charlsie Quest Referring Physician/Provider: Rejeana Brock, MD Date:10/31/2022 Duration: 22.39 mins Patient history: 82yo F getting eeg to evaluate for seizure Level of alertness: Awake, asleep AEDs during EEG study: LEV Technical aspects: This EEG study was done with scalp electrodes positioned according to the 10-20 International system of electrode placement. Electrical activity was reviewed with band pass filter of 1-70Hz , sensitivity of 7 uV/mm, display speed of 88mm/sec with a 60Hz  notched filter applied as appropriate. EEG data were recorded continuously and digitally stored.  Video monitoring was available and reviewed as appropriate. Description: The posterior dominant rhythm consists of 8-9 Hz activity of moderate voltage (25-35 uV) seen predominantly in posterior head regions, symmetric and reactive to eye opening and eye closing. Sleep was characterized by vertex waves, sleep spindles (12 to 14 Hz), maximal frontocentral region.  EEG showed intermittent generalized 3 to 6 Hz theta-delta slowing. Hyperventilation and photic stimulation were not performed.   ABNORMALITY - Intermittent slow, generalized IMPRESSION: This study is suggestive of mild diffuse encephalopathy. No seizures or epileptiform discharges were seen throughout the recording. Charlsie Quest   DG Abd 1 View  Result Date: 10/31/2022 CLINICAL DATA:  Abdominal pain EXAM: ABDOMEN - 1 VIEW COMPARISON:  08/29/2022 FINDINGS: Bowel gas pattern is nonspecific. Gas and stool are noted in colon. Small amount of stool is seen in colon. There are surgical staples and clips in lower abdomen and pelvis. There is no identifiable electronic device in abdomen and pelvis in the current study. In the previous examination, an electronic device, capsule most noted in region of rectum suggesting interval passage. Arterial  calcifications are seen. IMPRESSION: Nonspecific bowel gas pattern. There is no evidence of any metallic electronic capsule in the abdomen and pelvis. Electronically Signed   By: Ernie Avena M.D.   On: 10/31/2022 08:40   CT HEAD CODE STROKE WO CONTRAST  Result Date: 10/30/2022 CLINICAL DATA:  Code stroke.  Seizure, altered mental status. EXAM: CT HEAD WITHOUT CONTRAST TECHNIQUE: Contiguous axial images were obtained from the base of the skull through the vertex without intravenous contrast. RADIATION DOSE REDUCTION: This exam was performed according to the departmental dose-optimization program which includes automated exposure control, adjustment of the mA and/or kV according to patient size and/or use of iterative reconstruction technique. COMPARISON:  CT head 08/26/2022. FINDINGS: Brain: There is no evidence of acute territorial infarct. There are low-density subdural collections overlying both cerebral hemispheres measuring up to 6 mm on the left and 3 mm on the right without evidence of acute blood products, new since the prior head CT from 08/26/2022. There is no mass effect on the underlying brain parenchyma. Parenchymal volume loss with prominence of the ventricular system and extra-axial CSF spaces is stable. The ventricles are unchanged in size and configuration. A remote infarct in the left basal ganglia is unchanged. Additional confluent hypodensity in the supratentorial white matter likely reflects sequela of underlying chronic small-vessel ischemic change. The pituitary and suprasellar region are normal. There is no mass lesion. There is no midline shift. Vascular: There is calcification of the bilateral carotid siphons and vertebral arteries. Skull: Normal. Negative for fracture or focal lesion. Sinuses/Orbits: The paranasal sinuses are clear. The globes and orbits are unremarkable. Other: None. ASPECTS [facility] Stroke Program Early CT Score) - Ganglionic level infarction (caudate,  lentiform nuclei, internal capsule, insula, M1-M3 cortex): 7 - Supraganglionic infarction (M4-M6 cortex): 3 Total score (0-10 with 10 being  normal): 10 IMPRESSION: 1. No evidence of acute intracranial hemorrhage or acute territorial infarct. 2. Low-density subdural hygromas overlying both cerebral hemispheres measuring up to 6 mm on the left and 3 mm on the right, new since the ct head from 08/26/2022 but without evidence of acute blood within the collections. These results communicated via Amion at the time of interpretation on 10/30/2022 at 6:44 pm to provider [facility] , who verbally acknowledged these results. Electronically Signed   By: Lesia Hausen M.D.   On: 10/30/2022 18:46   DG Chest Port 1 View  Result Date: 10/28/2022 CLINICAL DATA:  Cough EXAM: PORTABLE CHEST 1 VIEW COMPARISON:  10/23/2022 FINDINGS: Prior CABG. Left dialysis catheter remains in place with the tip in the right atrium. Mild cardiomegaly. No overt edema. Bibasilar atelectasis. No effusions or acute bony abnormality. IMPRESSION: Cardiomegaly, bibasilar atelectasis. Electronically Signed   By: Charlett Nose M.D.   On: 10/28/2022 22:15   CT Chest Wo Contrast  Result Date: 10/23/2022 CLINICAL DATA:  Pneumonia, complication suspected, xray done EXAM: CT CHEST WITHOUT CONTRAST TECHNIQUE: Multidetector CT imaging of the chest was performed following the standard protocol without IV contrast. RADIATION DOSE REDUCTION: This exam was performed according to the departmental dose-optimization program which includes automated exposure control, adjustment of the mA and/or kV according to patient size and/or use of iterative reconstruction technique. COMPARISON:  Radiograph earlier today. Additional prior radiographs reviewed. Most recent chest CT available 07/16/2021 FINDINGS: Cardiovascular: Left-sided dialysis catheter tip is in the atrial caval junction. Mild cardiomegaly. Post CABG with calcification of native coronary arteries. Decreased  density of the blood pool typical of anemia. Advanced aortic atherosclerosis with aortic tortuosity. No aneurysm or periaortic stranding. No pericardial effusion. Mediastinum/Nodes: 14 mm lower anterior paratracheal node, similar to 2023 exam. Scattered additional shoddy mediastinal lymph nodes are also unchanged. No progressive adenopathy. Patulous esophagus. Lungs/Pleura: Small bilateral pleural effusions, left slightly larger than right. Slight vascular congestion. Minimal fluid in the fissures. Mild patchy ground-glass opacity in the dependent left greater than right lower lobe. Moderate central bronchial thickening which may be congestive or bronchitic. no endobronchial debris. No pulmonary mass. Upper Abdomen: Sequela of chronic renal disease with renal atrophy and cysts. No specific imaging follow-up of these cysts is recommended. A peripherally calcified density in the right upper kidney is stable from 2023 exam and consistent with a complex cyst, also needing no further follow-up. Left colonic diverticulosis. Musculoskeletal: Exaggerated thoracic kyphosis with degenerative change throughout the thoracic spine. Chronic mild L1 compression deformity. Slight increase in Schmorl's node inferior endplate of T11. Prior median sternotomy. There are no acute or suspicious osseous abnormalities. IMPRESSION: 1. Small bilateral pleural effusions, left slightly larger than right. Slight vascular congestion. 2. Mild patchy ground-glass opacity in the dependent left greater than right lower lobe may be atelectasis or pneumonia. 3. Moderate central bronchial thickening may be congestive or bronchitic. Aortic Atherosclerosis (ICD10-I70.0). Electronically Signed   By: Narda Rutherford M.D.   On: 10/23/2022 15:35   DG Chest Port 1 View  Result Date: 10/23/2022 CLINICAL DATA:  82 year old female with cough. Productive cough for 2 weeks. Some shortness of breath. EXAM: PORTABLE CHEST 1 VIEW COMPARISON:  Chest radiographs  10/02/2022 and earlier. FINDINGS: Portable AP upright view at 1107 hours. Prior CABG. Stable cardiomegaly and mediastinal contours. Calcified aortic atherosclerosis. Stable left chest dual lumen dialysis type catheter. Patchy, nonspecific increased left lung base opacity partially obscuring the diaphragm now. No superimposed pneumothorax or consolidation. Stable pulmonary vascularity without overt  edema. No confluent right lung opacity. Stable visualized osseous structures.  Negative visible bowel gas. IMPRESSION: 1. Patchy, nonspecific increased left lung base opacity new since last month. PA and lateral views may be helpful if feasible. 2. Otherwise stable cardiomegaly, pulmonary vascularity, prior CABG, chest dialysis catheter, Aortic Atherosclerosis. Electronically Signed   By: Odessa Fleming M.D.   On: 10/23/2022 11:16    Standley Dakins, MD  [facility]  If 7PM-7AM, please contact night-coverage [email or web address] Password [facility] 11/04/2022, 6:41 PM   LOS: 6 days

## 2022-11-04 NOTE — Progress Notes (Addendum)
Gastroenterology Progress Note   Referring Provider: No ref. provider found Primary Care Physician:  [facility]  Patient ID: Shawna [name]; [id number]; [birth year]    Subjective   Seen in dialysis. Tolerating diet.  Still having some abdominal pain to her left side.  + flatus but no BM yet   Objective   Vital signs in last 24 hours Temp:  [97.8 F (36.6 C)-98.6 F (37 C)] 97.8 F (36.6 C) (04/26 1345) Pulse Rate:  [51-67] 51 (04/26 1345) Resp:  [14-20] 17 (04/26 1530) BP: (123-164)/(49-72) 147/72 (04/26 1530) SpO2:  [98 %-100 %] 100 % (04/26 1345) Weight:  [60.2 kg] 60.2 kg (04/26 1345) Last BM Date : 11/01/22  Physical Exam General:   Alert and oriented, pleasant. NAD Head:  Normocephalic and atraumatic. Eyes:  No icterus, sclera clear. Conjuctiva pink.  Abdomen:  Bowel sounds present, soft,non-distended. Mild ttp to left side. No HSM or hernias noted. No rebound or guarding. No masses appreciated  Extremities:  Without clubbing or edema. Neurologic:  Alert and  oriented x4;  grossly normal neurologically. Skin:  Warm and dry, intact without significant lesions.  Psych:  Alert and cooperative. Normal mood and affect.  Intake/Output from previous day: 04/25 0701 - 04/26 0700 In: 240 [P.O.:240] Out: -  Intake/Output this shift: Total I/O In: 240 [P.O.:240] Out: -   Lab Results  Recent Labs    11/02/22 0537 11/02/22 1643 11/03/22 1156 11/04/22 0351  WBC 9.6  --  5.4 5.6  HGB 9.4* 10.3* 9.9* 9.2*  HCT 28.2* 30.3* 30.5* 28.2*  PLT 151  --  180 183   BMET Recent Labs    11/02/22 0537 11/03/22 1156 11/04/22 0351  NA 131* 133* 132*  K 3.9 3.6 4.4  CL 98 97* 97*  CO2 21* 26 23  GLUCOSE 103* 138* 74  BUN 34* 24* 35*  CREATININE 5.97* 4.62* 5.67*  CALCIUM 7.2* 7.4* 7.3*   LFT Recent Labs    11/02/22 0537 11/03/22 1156 11/04/22 0351  PROT  --  5.9* 5.3*  ALBUMIN 2.9* 3.0* 2.7*  2.7*  AST  --  12* 11*  ALT  --  12 11  ALKPHOS   --  85 74  BILITOT  --  0.2* 0.8  BILIDIR  --   --  0.2  IBILI  --   --  0.6   PT/INR No results for input(s): "LABPROT", "INR" in the last 72 hours. Hepatitis Panel No results for input(s): "HEPBSAG", "HCVAB", "HEPAIGM", "HEPBIGM" in the last 72 hours.   Studies/Results US Abdomen Limited RUQ (LIVER/GB)  Result Date: 11/03/2022 CLINICAL DATA:  Right upper quadrant pain EXAM: ULTRASOUND ABDOMEN LIMITED RIGHT UPPER QUADRANT COMPARISON:  CT 11/02/2022.  Older exams as well. FINDINGS: Gallbladder: Gallbladder is mildly distended. No shadowing stones or adjacent fluid but there is gallbladder wall thickening. Common bile duct: Diameter: 4 mm Liver: No focal lesion identified. Within normal limits in parenchymal echogenicity. Portal vein is patent on color Doppler imaging with normal direction of blood flow towards the liver. Other: None. IMPRESSION: No shadowing stones. The gallbladder wall is thickened which is nonspecific. No adjacent fluid. No reported sonographic Murphy sign. Please correlate with clinical symptomatology. If there is further concern, HIDA scan could be considered as clinically appropriate. No biliary ductal dilatation Electronically Signed   By: Karen Kays M.D.   On: 11/03/2022 14:59   CT ABDOMEN PELVIS WO CONTRAST  Result Date: 11/02/2022 CLINICAL DATA:  Acute abdominal pain after endoscopy  EXAM: CT ABDOMEN AND PELVIS WITHOUT CONTRAST TECHNIQUE: Multidetector CT imaging of the abdomen and pelvis was performed following the standard protocol without IV contrast. RADIATION DOSE REDUCTION: This exam was performed according to the departmental dose-optimization program which includes automated exposure control, adjustment of the mA and/or kV according to patient size and/or use of iterative reconstruction technique. COMPARISON:  CT 04/06/2020 without contrast FINDINGS: Lower chest: Small bilateral pleural effusions identified with some adjacent opacities. Atelectasis is favored over  infiltrate recommend follow-up. The heart is enlarged. Status post median sternotomy. Coronary artery calcifications are seen. Hepatobiliary: On this non IV contrast exam, the liver has some small calcifications, possibly related to old granulomatous disease. However the gallbladder is distended with some slight wall thickening and stranding. There is also some luminal high density. Sludge or stones are possible. If there is concern further of acute cholecystitis an ultrasound may be of some benefit as the next step in the workup. Pancreas: Moderate atrophy of the pancreas.  No obvious mass. Spleen: Spleen is nonenlarged. Adrenals/Urinary Tract: The adrenal glands are grossly preserved. There is severe atrophy of the kidneys with several vascular calcifications and tiny cystic areas. The more complex hyperdense area along the upper pole of the right kidney is stable. Previously measuring 2.8 x 2.0 cm and today 2.5 by 1.5 cm. Contracted urinary bladder. Stomach/Bowel: There is some fluid and debris in the stomach. Moderate diffuse scattered colonic stool. Left-sided colonic diverticulosis. Surgical changes along the rectosigmoid colon. No obstruction. Surgical changes as well along loops of bowel in the right lower quadrant. The appendix is not well seen. There are several loops of small bowel in the midabdomen which have air-fluid levels and are mildly dilated measuring up to 3.2 cm. No abrupt caliber change. Vascular/Lymphatic: Extensive vascular calcifications. Normal caliber aorta and IVC. No specific abnormal lymph node enlargement identified in the abdomen and pelvis. Reproductive: Status post hysterectomy. No adnexal masses. Other: Anasarca. No obstruction, free air or free fluid. Mild areas of mesenteric stranding and presacral space stranding. Musculoskeletal: Osteopenia. Scattered degenerative changes. Schmorl's node changes along lumbar spine and thoracic spine. IMPRESSION: Once again severely atrophic  kidneys with calcifications and cystic areas including complex area along the upper pole of the right kidney which is stable in size. Recommend continued surveillance. Developing small bilateral pleural effusion with adjacent lung opacities. Atelectasis versus infiltrate. Recommend follow-up. Increasing anasarca mesenteric stranding. There are some mildly dilated loops of small bowel with some air-fluid levels but no abrupt transition. Ileus versus mild partial obstruction is in the differential recommend close follow-up. Colonic diverticulosis. Distended gallbladder with wall thickening and some stranding. In addition there is some high attenuation material in the gallbladder. Please correlate with any sludge or stones. Please correlate with any prior contrast administration. Overall recommend further workup with ultrasound to further delineate when able. Electronically Signed   By: Karen Kays M.D.   On: 11/02/2022 14:27   MR BRAIN WO CONTRAST  Result Date: 10/31/2022 CLINICAL DATA:  Altered mental status EXAM: MRI HEAD WITHOUT CONTRAST TECHNIQUE: Multiplanar, multiecho pulse sequences of the brain and surrounding structures were obtained without intravenous contrast. COMPARISON:  04/23/2022 MRI, correlation is also made with 10/30/2022 CT head FINDINGS: Brain: No restricted diffusion to suggest acute or subacute infarct. No acute hemorrhage, mass, or midline shift. No hydrocephalus. Redemonstrated left greater than right subdural hygroma, which measures up to 4 mm on the left and 1-2 mm on the right, as seen on the prior CT but new from  the prior MRI. Normal pituitary and craniocervical junction. Remote lacunar infarcts in the bilateral basal ganglia and left cerebellum. Punctate foci of hemosiderin deposition in the right cerebellum, right campus, and right frontal and parietal lobes, possibly sequela of prior microhemorrhage. No superficial siderosis. Confluent T2 hyperintense signal in the periventricular  white matter, likely the sequela of moderate chronic small vessel ischemic disease. Vascular: Normal arterial flow voids. Skull and upper cervical spine: Normal marrow signal. Sinuses/Orbits: Mucosal thickening in the left sphenoid sinus. No acute finding in the orbits. Other: Trace fluid in the mastoid air cells. IMPRESSION: 1. No acute intracranial process. No evidence of acute or subacute infarct. 2. 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 Electronically Signed   By: Wiliam Ke M.D.   On: 10/31/2022 19:31   EEG adult  Result Date: 10/31/2022 Charlsie Quest, MD     10/31/2022  2:57 PM Patient Name: WREN PRYCE MRN: [id number] Epilepsy Attending: Charlsie Quest Referring Physician/Provider: Rejeana Brock, MD Date:10/31/2022 Duration: 22.39 mins Patient history: 82yo F getting eeg to evaluate for seizure Level of alertness: Awake, asleep AEDs during EEG study: LEV Technical aspects: This EEG study was done with scalp electrodes positioned according to the 10-20 International system of electrode placement. Electrical activity was reviewed with band pass filter of 1-70Hz , sensitivity of 7 uV/mm, display speed of 68mm/sec with a 60Hz  notched filter applied as appropriate. EEG data were recorded continuously and digitally stored.  Video monitoring was available and reviewed as appropriate. Description: The posterior dominant rhythm consists of 8-9 Hz activity of moderate voltage (25-35 uV) seen predominantly in posterior head regions, symmetric and reactive to eye opening and eye closing. Sleep was characterized by vertex waves, sleep spindles (12 to 14 Hz), maximal frontocentral region.  EEG showed intermittent generalized 3 to 6 Hz theta-delta slowing. Hyperventilation and photic stimulation were not performed.   ABNORMALITY - Intermittent slow, generalized IMPRESSION: This study is suggestive of mild diffuse encephalopathy. No  seizures or epileptiform discharges were seen throughout the recording. Charlsie Quest   DG Abd 1 View  Result Date: 10/31/2022 CLINICAL DATA:  Abdominal pain EXAM: ABDOMEN - 1 VIEW COMPARISON:  08/29/2022 FINDINGS: Bowel gas pattern is nonspecific. Gas and stool are noted in colon. Small amount of stool is seen in colon. There are surgical staples and clips in lower abdomen and pelvis. There is no identifiable electronic device in abdomen and pelvis in the current study. In the previous examination, an electronic device, capsule most noted in region of rectum suggesting interval passage. Arterial calcifications are seen. IMPRESSION: Nonspecific bowel gas pattern. There is no evidence of any metallic electronic capsule in the abdomen and pelvis. Electronically Signed   By: Ernie Avena M.D.   On: 10/31/2022 08:40   CT HEAD CODE STROKE WO CONTRAST  Result Date: 10/30/2022 CLINICAL DATA:  Code stroke.  Seizure, altered mental status. EXAM: CT HEAD WITHOUT CONTRAST TECHNIQUE: Contiguous axial images were obtained from the base of the skull through the vertex without intravenous contrast. RADIATION DOSE REDUCTION: This exam was performed according to the departmental dose-optimization program which includes automated exposure control, adjustment of the mA and/or kV according to patient size and/or use of iterative reconstruction technique. COMPARISON:  CT head 08/26/2022. FINDINGS: Brain: There is no evidence of acute territorial infarct. There are low-density subdural collections overlying both cerebral hemispheres measuring up to 6 mm on the left and 3  mm on the right without evidence of acute blood products, new since the prior head CT from 08/26/2022. There is no mass effect on the underlying brain parenchyma. Parenchymal volume loss with prominence of the ventricular system and extra-axial CSF spaces is stable. The ventricles are unchanged in size and configuration. A remote infarct in the left  basal ganglia is unchanged. Additional confluent hypodensity in the supratentorial white matter likely reflects sequela of underlying chronic small-vessel ischemic change. The pituitary and suprasellar region are normal. There is no mass lesion. There is no midline shift. Vascular: There is calcification of the bilateral carotid siphons and vertebral arteries. Skull: Normal. Negative for fracture or focal lesion. Sinuses/Orbits: The paranasal sinuses are clear. The globes and orbits are unremarkable. Other: None. ASPECTS [facility] Stroke Program Early CT Score) - Ganglionic level infarction (caudate, lentiform nuclei, internal capsule, insula, M1-M3 cortex): 7 - Supraganglionic infarction (M4-M6 cortex): 3 Total score (0-10 with 10 being normal): 10 IMPRESSION: 1. No evidence of acute intracranial hemorrhage or acute territorial infarct. 2. Low-density subdural hygromas overlying both cerebral hemispheres measuring up to 6 mm on the left and 3 mm on the right, new since the ct head from 08/26/2022 but without evidence of acute blood within the collections. These results communicated via Amion at the time of interpretation on 10/30/2022 at 6:44 pm to provider [facility] , who verbally acknowledged these results. Electronically Signed   By: Lesia Hausen M.D.   On: 10/30/2022 18:46   DG Chest Port 1 View  Result Date: 10/28/2022 CLINICAL DATA:  Cough EXAM: PORTABLE CHEST 1 VIEW COMPARISON:  10/23/2022 FINDINGS: Prior CABG. Left dialysis catheter remains in place with the tip in the right atrium. Mild cardiomegaly. No overt edema. Bibasilar atelectasis. No effusions or acute bony abnormality. IMPRESSION: Cardiomegaly, bibasilar atelectasis. Electronically Signed   By: Charlett Nose M.D.   On: 10/28/2022 22:15   CT Chest Wo Contrast  Result Date: 10/23/2022 CLINICAL DATA:  Pneumonia, complication suspected, xray done EXAM: CT CHEST WITHOUT CONTRAST TECHNIQUE: Multidetector CT imaging of the chest was performed  following the standard protocol without IV contrast. RADIATION DOSE REDUCTION: This exam was performed according to the departmental dose-optimization program which includes automated exposure control, adjustment of the mA and/or kV according to patient size and/or use of iterative reconstruction technique. COMPARISON:  Radiograph earlier today. Additional prior radiographs reviewed. Most recent chest CT available 07/16/2021 FINDINGS: Cardiovascular: Left-sided dialysis catheter tip is in the atrial caval junction. Mild cardiomegaly. Post CABG with calcification of native coronary arteries. Decreased density of the blood pool typical of anemia. Advanced aortic atherosclerosis with aortic tortuosity. No aneurysm or periaortic stranding. No pericardial effusion. Mediastinum/Nodes: 14 mm lower anterior paratracheal node, similar to 2023 exam. Scattered additional shoddy mediastinal lymph nodes are also unchanged. No progressive adenopathy. Patulous esophagus. Lungs/Pleura: Small bilateral pleural effusions, left slightly larger than right. Slight vascular congestion. Minimal fluid in the fissures. Mild patchy ground-glass opacity in the dependent left greater than right lower lobe. Moderate central bronchial thickening which may be congestive or bronchitic. no endobronchial debris. No pulmonary mass. Upper Abdomen: Sequela of chronic renal disease with renal atrophy and cysts. No specific imaging follow-up of these cysts is recommended. A peripherally calcified density in the right upper kidney is stable from 2023 exam and consistent with a complex cyst, also needing no further follow-up. Left colonic diverticulosis. Musculoskeletal: Exaggerated thoracic kyphosis with degenerative change throughout the thoracic spine. Chronic mild L1 compression deformity. Slight increase in Schmorl's node inferior  endplate of T11. Prior median sternotomy. There are no acute or suspicious osseous abnormalities. IMPRESSION: 1. Small  bilateral pleural effusions, left slightly larger than right. Slight vascular congestion. 2. Mild patchy ground-glass opacity in the dependent left greater than right lower lobe may be atelectasis or pneumonia. 3. Moderate central bronchial thickening may be congestive or bronchitic. Aortic Atherosclerosis (ICD10-I70.0). Electronically Signed   By: Narda Rutherford M.D.   On: 10/23/2022 15:35   DG Chest Port 1 View  Result Date: 10/23/2022 CLINICAL DATA:  82 year old female with cough. Productive cough for 2 weeks. Some shortness of breath. EXAM: PORTABLE CHEST 1 VIEW COMPARISON:  Chest radiographs 10/02/2022 and earlier. FINDINGS: Portable AP upright view at 1107 hours. Prior CABG. Stable cardiomegaly and mediastinal contours. Calcified aortic atherosclerosis. Stable left chest dual lumen dialysis type catheter. Patchy, nonspecific increased left lung base opacity partially obscuring the diaphragm now. No superimposed pneumothorax or consolidation. Stable pulmonary vascularity without overt edema. No confluent right lung opacity. Stable visualized osseous structures.  Negative visible bowel gas. IMPRESSION: 1. Patchy, nonspecific increased left lung base opacity new since last month. PA and lateral views may be helpful if feasible. 2. Otherwise stable cardiomegaly, pulmonary vascularity, prior CABG, chest dialysis catheter, Aortic Atherosclerosis. Electronically Signed   By: Odessa Fleming M.D.   On: 10/23/2022 11:16    Assessment  82 y.o. female with a history of dementia, ESRD on HD, A-fib on ASA, CAD, CHF, small bowel AVMs, GERD, HTN, colon cancer s/p resection in 1992, duodenal adenoma who presented to the hospital with weakness and fatigue following dialysis.  Anemia: Hemoglobin noted to be 7.1 on admission.  She received 1 unit PRBC.  Hemoglobin stable at 9.2 today.  Her baseline is the 9-10 range.  Prior history of gastric AVMs s/p APC therapy in February 2024.  Underwent small bowel capsule with small  bowel AVMs, possibly jejunum, and 2 small polyps in the distal small bowel.  She underwent small bowel endoscopy this admission which showed nonbleeding gastric ulcer s/p biopsy, jejunal polyp, and nonbleeding jejunal diverticula.  She was started on octreotide 100 mcg twice daily which will need to be continued on discharge.  Abdominal pain: Patient began having abdominal pain 4/24.  CT A/P without contrast revealed concern for ileus versus partial small bowel obstruction and distended gallbladder with wall thickening and some stranding, possible sludge or stone present within the gallbladder.  RUQ [place] without shadowing stones but with presence of thickened gallbladder wall that is nonspecific.  Recommended HIDA scan if clinically appropriate.  Hospitalist was consulted general surgery.  Her LFTs remain within normal limits.  Suspect majority of her abdominal pain is secondary to ileus/partial small bowel obstruction.  She has began to pass flatus but has not had a bowel movement on twice daily MiraLAX.  Her pain and tenderness is more located on the left side therefore suspect constipation/ileus is the most likely cause for her abdominal pain.  Will increase MiraLAX to 3 times daily and if no improvement could consider Linzess to assist in bowel movement.  Plan / Recommendations  Increase miralax to TID If no improvement with miralax could consider Linzess Follow up path results.  Continue Octreotide 100 mcg BID (need to notify office when patient discharged so PA can be processed).  May need general surgery consult for concern for cholecystitis    LOS: 6 days    11/04/2022, 4:07 PM   Brooke Bonito, MSN, FNP-BC, AGACNP-BC [facility]

## 2022-11-04 NOTE — Progress Notes (Signed)
[facility] Progress Note   Assessment/ Plan:   ESRD MWF L IJ TDC [facility]: on scheudle., no heparin, Max UF 2L.  Next Hd today ABLA with likely UGI source, GI following. Transfused at presentation.  EGD/enteroscopy 4/23 wit GI HTN/Vol:BPs improved with UF.  Still looks like a little volume to give. Anemia: as above CKD-BMD: Ca corrects to around 8, and P ok.  Continue calcitriol.  Cinacalcet is 3 times weekly.  If calcium cont to drop further discontinue cinacalcet.  Resume binders once eating. AMS: Neurology s/o.  Waxing/waning.  Likely delirium.  Some concern for seizures. Abd pain: Ct looks like ileus ? Cholecystitis- blood cultures drawn, started Zosyn  Dispo: pending  Subjective:    Seen in room, eating lunch     Objective:   BP (!) 123/53   Pulse (!) 51   Temp 98 F (36.7 C) (Oral)   Resp 14   Ht 5\' 5"  (1.651 m)   Wt 51.5 kg   SpO2 100%   BMI 18.89 kg/m   Physical Exam: Gen:NAD, sitting in bed CVS: RRR Resp: muffled at bases Abd: soft Ext: trace LE edema, 1+ UE edema, R > L ACCESS: clotted AVF, L TDC + T/B  Labs: BMET Recent Labs  Lab 10/30/22 0546 10/30/22 1900 10/31/22 0541 11/01/22 1055 11/02/22 0537 11/03/22 1156 11/04/22 0351  NA 137 133* 135 132* 131* 133* 132*  K 5.1 5.0 4.6 3.9 3.9 3.6 4.4  CL 100 100 102 97* 98 97* 97*  CO2 25 15* 19* 25 21* 26 23  GLUCOSE 89 193* 130* 87 103* 138* 74  BUN 44* 55* 61* 30* 34* 24* 35*  CREATININE 5.88* 6.72* 7.24* 5.05* 5.97* 4.62* 5.67*  CALCIUM 7.4* 7.4* 7.5* 7.2* 7.2* 7.4* 7.3*  PHOS 5.6*  --   --   --  5.3*  --  5.6*   CBC Recent Labs  Lab 10/28/22 2129 10/29/22 1931 10/31/22 0541 11/02/22 0537 11/02/22 1643 11/03/22 1156 11/04/22 0351  WBC 6.2   < > 8.1 9.6  --  5.4 5.6  NEUTROABS 4.6  --   --   --   --   --   --   HGB 7.1*   < > 10.1* 9.4* 10.3* 9.9* 9.2*  HCT 22.1*   < > 30.8* 28.2* 30.3* 30.5* 28.2*  MCV 120.8*   < > 104.4* 105.6*  --  108.9* 107.6*  PLT 215   < > 191  151  --  180 183   < > = values in this interval not displayed.      Medications:     amiodarone  100 mg Oral Daily   calcitRIOL  1.75 mcg Oral Q M,W,F-HD   Chlorhexidine Gluconate Cloth  6 each Topical Daily   Chlorhexidine Gluconate Cloth  6 each Topical Q0600   cinacalcet  30 mg Oral Q M,W,F-HD   levETIRAcetam  500 mg Oral Daily   levothyroxine  75 mcg Oral QAC breakfast   lisinopril  20 mg Oral Daily   magic mouthwash  5 mL Oral TID   metoprolol tartrate  12.5 mg Oral BID   octreotide  100 mcg Subcutaneous Q12H   polyethylene glycol  17 g Oral BID   rosuvastatin  10 mg Oral Daily     Bufford Buttner, MD 11/04/2022, 1:07 PM

## 2022-11-05 ENCOUNTER — Inpatient Hospital Stay (HOSPITAL_COMMUNITY): Payer: Medicare Other

## 2022-11-05 DIAGNOSIS — N186 End stage renal disease: Secondary | ICD-10-CM | POA: Diagnosis not present

## 2022-11-05 DIAGNOSIS — K259 Gastric ulcer, unspecified as acute or chronic, without hemorrhage or perforation: Secondary | ICD-10-CM | POA: Diagnosis not present

## 2022-11-05 DIAGNOSIS — D649 Anemia, unspecified: Secondary | ICD-10-CM | POA: Diagnosis not present

## 2022-11-05 DIAGNOSIS — I482 Chronic atrial fibrillation, unspecified: Secondary | ICD-10-CM | POA: Diagnosis not present

## 2022-11-05 DIAGNOSIS — K559 Vascular disorder of intestine, unspecified: Secondary | ICD-10-CM | POA: Diagnosis not present

## 2022-11-05 DIAGNOSIS — E119 Type 2 diabetes mellitus without complications: Secondary | ICD-10-CM | POA: Diagnosis not present

## 2022-11-05 LAB — RENAL FUNCTION PANEL
Albumin: 2.7 g/dL — ABNORMAL LOW (ref 3.5–5.0)
Anion gap: 11 (ref 5–15)
BUN: 25 mg/dL — ABNORMAL HIGH (ref 8–23)
CO2: 25 mmol/L (ref 22–32)
Calcium: 7.6 mg/dL — ABNORMAL LOW (ref 8.9–10.3)
Chloride: 97 mmol/L — ABNORMAL LOW (ref 98–111)
Creatinine, Ser: 4.36 mg/dL — ABNORMAL HIGH (ref 0.44–1.00)
GFR, Estimated: 10 mL/min — ABNORMAL LOW (ref >60)
Glucose, Bld: 83 mg/dL (ref 70–99)
Phosphorus: 4.9 mg/dL — ABNORMAL HIGH (ref 2.5–4.6)
Potassium: 4.3 mmol/L (ref 3.5–5.1)
Sodium: 133 mmol/L — ABNORMAL LOW (ref 135–145)

## 2022-11-05 LAB — CULTURE, BLOOD (ROUTINE X 2)

## 2022-11-05 MED ORDER — CHLORHEXIDINE GLUCONATE CLOTH 2 % EX PADS
6.0000 | MEDICATED_PAD | Freq: Every day | CUTANEOUS | Status: DC
  Administered 2022-11-06 – 2022-11-11 (×6): 6 via TOPICAL

## 2022-11-05 MED ORDER — MILK AND MOLASSES ENEMA
1.0000 | Freq: Once | RECTAL | Status: AC
  Administered 2022-11-05: 240 mL via RECTAL

## 2022-11-05 NOTE — Progress Notes (Signed)
IR was requested for image guided possible mesenteric artery stent placement.    Case was reviewed by Dr. Archer Asa, recommends CTA ABD/Pelvis to better visualize mesenteric arteries.  Per chart family refused CTA before due to contrast allergy.   Please note that if patient deems a candidate for stent placement, contrast will have to be used during the stent placement.    Ordering provider notified.  Please call IR for questions and concerns.   Lynann Bologna Alphus Zeck PA-C 11/05/2022 12:50 PM

## 2022-11-05 NOTE — Progress Notes (Addendum)
PROGRESS NOTE  CLARIBEL SACHS [name]:[id number] DOB: 04/05/1941 DOA: 10/28/2022 PCP: [facility]  Brief History:  82 year old female with a history of ESRD (MWF), coronary artery disease status post CABG, CHF, hypertension, hyperlipidemia, paroxysmal atrial fibrillation, hyperlipidemia, GI bleed presenting with generalized weakness and not feeling well after dialysis. At the time of admission, the patient was noted to have a hemoglobin of 7.1.  She was transfused 2 units PRBC.  GI was consulted to assist with management. Unfortunately, the patient had a rapid response called on the evening of 10/30/2022 secondary to altered mental status.  Apparently, the patient was noted to have seizure-like activity by nursing staff.  Apparently the patient was lethargic minimally responsive to painful stimuli.  There were shaking activity lasting 3 minutes noted by nursing staff.  Code stroke was activated.  CT of the brain was negative for hemorrhage.  There was some low-density subdural hygromas.  The patient was seen by teleneurology.  CTA of the head and neck was recommended, but family refused secondary to the patient's contrast allergy.  The patient was loaded with Keppra.  EEG was ordered.  They felt that the patient could stay at [facility] as long as she improves and follows commands.  She returns her to the ICU.  As the evening progressed, the patient was more responsive.  Apparently, the patient was refusing to follow commands but would speak when probed and speech was clear.  The patient did follow some simple commands.  The patient had recent hospitalization from 08/26/22 to 08/29/22 secondary to acute on chronic blood loss anemia.  Hemoccult was positive at that time.  The patmultiple gastric AVMs treated with APC, small hiatal hernia, no active or stigmata of bleeding.  Capsule was placed placed during EGD and showed few small AVMs in the small bowel, likely jejunum.  2 small polyps in  the distal small bowel previously noted. Capsule appears to sit at the ileocolonic anastomosis for the last 2 to 3 hours of study.  She was transfused 2 units PRBC during that hospital admission.  She is not felt to be a candidate for anticoagulation due to her recurrent GI bleeds.  The patient was noted to have hospital delirium and felt to have a degree of underlying cognitive impairment.  She also had a previous hospital admission from 08/06/2022 to 08/17/2022 due to acute on chronic encephalopathy during the hospitalization.  At best, it appears that the patient was oriented only to person at that time.  Family has noted that the patient has had confusion at home since wintertime 2023.  Once again, she had acute on chronic anemia.  She underwent EGD and colonoscopy on 08/10/2022.duodenal and colonic polyps which were removed. 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  She was seen by palliative medicine and full scope of care was continued.   Assessment/Plan: Acute on chronic blood loss anemia -GI consult appreciated -Transfused 1 unit PRBC this admission -Presented with hemoglobin 7.1 -Hemoglobin remained stable since transfusion -Blood loss felt to be secondary to the patient's gastric and small bowel AVMs -4/23 SB enteroscopy--nonbleeding gastric ulcer with clean base; non bleeding jejunal diverticula; no AVMs -she is treated with BID octreotide injections -Hgb remains largely stable around 8    Myoclonus/Seizure like activity -The patient has had similar episodes of what was described on the evening of 10/30/2022 in the past previously felt to be secondary to anemia and gabapentin in the setting of ESRD patient -  Appreciate neurology consult>>loaded keppra -Continue Keppra per neurologist - no further episodes on keppra   Generalized Abdominal Pain  -CT abd/pelvis - suggesting mild ileus and cholelithiasis  -[place] RUQ pending for further gallbladder work up - HIDA scan not recommended by surgical  team -tolerating diet, but endorses post-prandial pain -laxatives added for constipation / mild ileus  -will ask for surgery eval - see surgery consult and recs -likely has underlying mesenteric angina, poor candidate for surgical intervention, discussed with surgeon, will ask IR to evaluate if she could be a candidate for palliative stenting -treating constipation, MOM enema ordered 4/27   ESRD -Dialyzes Monday, Wednesday, Friday -last dialysis 11/02/2022 -Appreciate nephrology follow-up   Paroxysmal Atrial fibrillation/SVT -Not a candidate for anticoagulation secondary to recurrent GI bleed -Continue amiodarone and metoprolol  -currently in sinus, but has intermittent episodes of SVT   Essential hypertension -Continue home medications-metoprolol and lisinopril -Well-documented that the patient has had uncontrolled hypertension improved with HD volume removal   Coronary Artery Disease s/p CABG in 2013 with DES to D1 in 10/2016 with cath showing patent LIMA-LAD, SVG-RI, SVG-Mrg and SVG-RCA and repeat cath in 07/2018 showing patent grafts with occlusion of D1 at prior stent site and progression of PDA disease and medical management was recommended -continue imdur, ASA 81 mg, simavastatin   Hypothyroidism -Continue home dose levothyroxine     Cognitive impairment -Documented over the last several hospitalization the patient has had baseline cognitive impairment and hospital delirium -She is at high risk for recurrent delirium due to her metabolic derangement and hospital stays -10/30/2022 CT brain negative -continue delirium precautions and frequent family visitation       Family Communication:     Consultants:  GI, neurology, renal, surgery   Code Status:  FULL    DVT Prophylaxis:  SCDs     Procedures: As Listed in Progress Note Above   Antibiotics: None   Subjective: Pt still having abdominal pain, seems worse with eating, having bowel movements.     Objective: Vitals:   11/04/22 1700 11/04/22 1730 11/04/22 2029 11/05/22 0448  BP: (!) 180/63 (!) 164/59 (!) 184/59 (!) 166/55  Pulse:  (!) 54 61 (!) 56  Resp: 18 18 20 20   Temp:  97.8 F (36.6 C) 98.4 F (36.9 C) 98.1 F (36.7 C)  TempSrc:  Oral Oral   SpO2:  100% 99% 97%  Weight:  56.6 kg    Height:        Intake/Output Summary (Last 24 hours) at 11/05/2022 1149 Last data filed at 11/05/2022 0700 Gross per 24 hour  Intake 350 ml  Output 2000 ml  Net -1650 ml   Weight change:  Exam:  General:  Pt appears chronically ill and intermittently confused, eating breakfast, no distress noted HEENT: supple neck, No icterus, No thrush, No neck mass, [place]/AT Cardiovascular: normal S1/S2, no rubs, no gallops Respiratory: CTA bilaterally, no wheezing, no crackles, no rhonchi Abdomen: Soft/+BS, generalized abd tenderness, non distended, no guarding Extremities: trace pretibial edema, No lymphangitis, No petechiae, No rashes, no synovitis  Data Reviewed: I have personally reviewed following labs and imaging studies Basic Metabolic Panel: Recent Labs  Lab 10/30/22 0546 10/30/22 1900 10/31/22 0541 11/01/22 1055 11/02/22 0537 11/03/22 1156 11/04/22 0351 11/05/22 0426  NA 137 133*   < > 132* 131* 133* 132* 133*  K 5.1 5.0   < > 3.9 3.9 3.6 4.4 4.3  CL 100 100   < > 97* 98 97* 97* 97*  CO2 25  15*   < > 25 21* 26 23 25   GLUCOSE 89 193*   < > 87 103* 138* 74 83  BUN 44* 55*   < > 30* 34* 24* 35* 25*  CREATININE 5.88* 6.72*   < > 5.05* 5.97* 4.62* 5.67* 4.36*  CALCIUM 7.4* 7.4*   < > 7.2* 7.2* 7.4* 7.3* 7.6*  MG  --  1.8  --   --   --   --   --   --   PHOS 5.6*  --   --   --  5.3*  --  5.6* 4.9*   < > = values in this interval not displayed.   Liver Function Tests: Recent Labs  Lab 10/30/22 1900 11/02/22 0537 11/03/22 1156 11/04/22 0351 11/05/22 0426  AST 15  --  12* 11*  --   ALT 13  --  12 11  --   ALKPHOS 103  --  85 74  --   BILITOT 1.0  --  0.2* 0.8  --   PROT 6.6   --  5.9* 5.3*  --   ALBUMIN 3.4* 2.9* 3.0* 2.7*  2.7* 2.7*   No results for input(s): "LIPASE", "AMYLASE" in the last 168 hours. No results for input(s): "AMMONIA" in the last 168 hours. Coagulation Profile: No results for input(s): "INR", "PROTIME" in the last 168 hours. CBC: Recent Labs  Lab 10/30/22 1900 10/31/22 0541 11/02/22 0537 11/02/22 1643 11/03/22 1156 11/04/22 0351  WBC 8.4 8.1 9.6  --  5.4 5.6  HGB 10.9* 10.1* 9.4* 10.3* 9.9* 9.2*  HCT 34.4* 30.8* 28.2* 30.3* 30.5* 28.2*  MCV 110.3* 104.4* 105.6*  --  108.9* 107.6*  PLT 188 191 151  --  180 183   Cardiac Enzymes: No results for input(s): "CKTOTAL", "CKMB", "CKMBINDEX", "TROPONINI" in the last 168 hours. BNP: Invalid input(s): "POCBNP" CBG: Recent Labs  Lab 10/30/22 1811 11/01/22 1704 11/02/22 2126 11/03/22 2020  GLUCAP 126* 67* 137* 200*   HbA1C: No results for input(s): "HGBA1C" in the last 72 hours. Urine analysis:    Component Value Date/Time   COLORURINE YELLOW 12/16/2012 1919   APPEARANCEUR CLOUDY (A) 12/16/2012 1919   LABSPEC 1.009 12/16/2012 1919   PHURINE 7.5 12/16/2012 1919   GLUCOSEU NEGATIVE 12/16/2012 1919   HGBUR TRACE (A) 12/16/2012 1919   BILIRUBINUR NEGATIVE 12/16/2012 1919   KETONESUR NEGATIVE 12/16/2012 1919   PROTEINUR 100 (A) 12/16/2012 1919   UROBILINOGEN 0.2 12/16/2012 1919   NITRITE NEGATIVE 12/16/2012 1919   LEUKOCYTESUR SMALL (A) 12/16/2012 1919   Recent Results (from the past 240 hour(s))  MRSA Next Gen by PCR, Nasal     Status: None   Collection Time: 10/30/22  6:52 PM   Specimen: Nasal Mucosa; Nasal Swab  Result Value Ref Range Status   MRSA by PCR Next Gen NOT DETECTED NOT DETECTED Final    Comment: (NOTE) The GeneXpert MRSA Assay (FDA approved for NASAL specimens only), is one component of a comprehensive MRSA colonization surveillance program. It is not intended to diagnose MRSA infection nor to guide or monitor treatment for MRSA infections. Test performance  is not FDA approved in patients less than 92 years old. Performed at [facility], [address] [place], [place] [address]   Culture, blood (Routine X 2) w Reflex to ID Panel     Status: None (Preliminary result)   Collection Time: 11/02/22  7:24 PM   Specimen: BLOOD  Result Value Ref Range Status   Specimen Description BLOOD  BLOOD RIGHT HAND  Final   Special Requests   Final    BOTTLES DRAWN AEROBIC AND ANAEROBIC Blood Culture adequate volume   Culture   Final    NO GROWTH 3 DAYS Performed at [facility], [address] [place], [place] [address]    Report Status PENDING  Incomplete  Culture, blood (Routine X 2) w Reflex to ID Panel     Status: None (Preliminary result)   Collection Time: 11/02/22  7:32 PM   Specimen: BLOOD  Result Value Ref Range Status   Specimen Description BLOOD BLOOD RIGHT ARM  Final   Special Requests   Final    BOTTLES DRAWN AEROBIC AND ANAEROBIC Blood Culture adequate volume   Culture   Final    NO GROWTH 3 DAYS Performed at [facility], [address] [place], [place] [address]    Report Status PENDING  Incomplete     Scheduled Meds:  amiodarone  100 mg Oral Daily   calcitRIOL  1.75 mcg Oral Q M,W,F-HD   Chlorhexidine Gluconate Cloth  6 each Topical Daily   Chlorhexidine Gluconate Cloth  6 each Topical Q0600   cinacalcet  30 mg Oral Q M,W,F-HD   levETIRAcetam  500 mg Oral Daily   levothyroxine  75 mcg Oral QAC breakfast   lisinopril  20 mg Oral Daily   magic mouthwash  5 mL Oral TID   metoprolol tartrate  12.5 mg Oral BID   milk and molasses  1 enema Rectal Once   octreotide  100 mcg Subcutaneous Q12H   polyethylene glycol  17 g Oral TID   rosuvastatin  10 mg Oral Daily   Continuous Infusions:  anticoagulant sodium citrate     piperacillin-tazobactam (ZOSYN)  IV 2.25 g (11/05/22 0553)    Procedures/Studies: DG Abd Portable 2V  Result Date: 11/05/2022 CLINICAL DATA:  Abdominal pain EXAM: PORTABLE ABDOMEN - 2 VIEW COMPARISON:  CT  11/02/2022 FINDINGS: No dilated loops of large or small bowel identified. Increasing stool burden identified within the colon. No air-fluid levels identified. Surgical clips noted within the pelvis. Left lung base opacity identified within the imaged portions of the lower chest. Patient has a left chest wall dialysis catheter with tips in the right atrium. Signs of previous CABG procedure. IMPRESSION: 1. Nonobstructive bowel gas pattern. 2. Increasing stool burden within the colon. 3. Left base opacity identified within the imaged portions of the chest. Correlate for any clinical signs/symptoms of pneumonia. Electronically Signed   By: Signa Kell M.D.   On: 11/05/2022 10:27   US Abdomen Limited RUQ (LIVER/GB)  Result Date: 11/03/2022 CLINICAL DATA:  Right upper quadrant pain EXAM: ULTRASOUND ABDOMEN LIMITED RIGHT UPPER QUADRANT COMPARISON:  CT 11/02/2022.  Older exams as well. FINDINGS: Gallbladder: Gallbladder is mildly distended. No shadowing stones or adjacent fluid but there is gallbladder wall thickening. Common bile duct: Diameter: 4 mm Liver: No focal lesion identified. Within normal limits in parenchymal echogenicity. Portal vein is patent on color Doppler imaging with normal direction of blood flow towards the liver. Other: None. IMPRESSION: No shadowing stones. The gallbladder wall is thickened which is nonspecific. No adjacent fluid. No reported sonographic Murphy sign. Please correlate with clinical symptomatology. If there is further concern, HIDA scan could be considered as clinically appropriate. No biliary ductal dilatation Electronically Signed   By: Karen Kays M.D.   On: 11/03/2022 14:59   CT ABDOMEN PELVIS WO CONTRAST  Result Date: 11/02/2022 CLINICAL DATA:  Acute abdominal pain after endoscopy EXAM:  CT ABDOMEN AND PELVIS WITHOUT CONTRAST TECHNIQUE: Multidetector CT imaging of the abdomen and pelvis was performed following the standard protocol without IV contrast. RADIATION DOSE  REDUCTION: This exam was performed according to the departmental dose-optimization program which includes automated exposure control, adjustment of the mA and/or kV according to patient size and/or use of iterative reconstruction technique. COMPARISON:  CT 04/06/2020 without contrast FINDINGS: Lower chest: Small bilateral pleural effusions identified with some adjacent opacities. Atelectasis is favored over infiltrate recommend follow-up. The heart is enlarged. Status post median sternotomy. Coronary artery calcifications are seen. Hepatobiliary: On this non IV contrast exam, the liver has some small calcifications, possibly related to old granulomatous disease. However the gallbladder is distended with some slight wall thickening and stranding. There is also some luminal high density. Sludge or stones are possible. If there is concern further of acute cholecystitis an ultrasound may be of some benefit as the next step in the workup. Pancreas: Moderate atrophy of the pancreas.  No obvious mass. Spleen: Spleen is nonenlarged. Adrenals/Urinary Tract: The adrenal glands are grossly preserved. There is severe atrophy of the kidneys with several vascular calcifications and tiny cystic areas. The more complex hyperdense area along the upper pole of the right kidney is stable. Previously measuring 2.8 x 2.0 cm and today 2.5 by 1.5 cm. Contracted urinary bladder. Stomach/Bowel: There is some fluid and debris in the stomach. Moderate diffuse scattered colonic stool. Left-sided colonic diverticulosis. Surgical changes along the rectosigmoid colon. No obstruction. Surgical changes as well along loops of bowel in the right lower quadrant. The appendix is not well seen. There are several loops of small bowel in the midabdomen which have air-fluid levels and are mildly dilated measuring up to 3.2 cm. No abrupt caliber change. Vascular/Lymphatic: Extensive vascular calcifications. Normal caliber aorta and IVC. No specific abnormal  lymph node enlargement identified in the abdomen and pelvis. Reproductive: Status post hysterectomy. No adnexal masses. Other: Anasarca. No obstruction, free air or free fluid. Mild areas of mesenteric stranding and presacral space stranding. Musculoskeletal: Osteopenia. Scattered degenerative changes. Schmorl's node changes along lumbar spine and thoracic spine. IMPRESSION: Once again severely atrophic kidneys with calcifications and cystic areas including complex area along the upper pole of the right kidney which is stable in size. Recommend continued surveillance. Developing small bilateral pleural effusion with adjacent lung opacities. Atelectasis versus infiltrate. Recommend follow-up. Increasing anasarca mesenteric stranding. There are some mildly dilated loops of small bowel with some air-fluid levels but no abrupt transition. Ileus versus mild partial obstruction is in the differential recommend close follow-up. Colonic diverticulosis. Distended gallbladder with wall thickening and some stranding. In addition there is some high attenuation material in the gallbladder. Please correlate with any sludge or stones. Please correlate with any prior contrast administration. Overall recommend further workup with ultrasound to further delineate when able. Electronically Signed   By: Karen Kays M.D.   On: 11/02/2022 14:27   MR BRAIN WO CONTRAST  Result Date: 10/31/2022 CLINICAL DATA:  Altered mental status EXAM: MRI HEAD WITHOUT CONTRAST TECHNIQUE: Multiplanar, multiecho pulse sequences of the brain and surrounding structures were obtained without intravenous contrast. COMPARISON:  04/23/2022 MRI, correlation is also made with 10/30/2022 CT head FINDINGS: Brain: No restricted diffusion to suggest acute or subacute infarct. No acute hemorrhage, mass, or midline shift. No hydrocephalus. Redemonstrated left greater than right subdural hygroma, which measures up to 4 mm on the left and 1-2 mm on the right, as seen  on the prior CT but new from  the prior MRI. Normal pituitary and craniocervical junction. Remote lacunar infarcts in the bilateral basal ganglia and left cerebellum. Punctate foci of hemosiderin deposition in the right cerebellum, right campus, and right frontal and parietal lobes, possibly sequela of prior microhemorrhage. No superficial siderosis. Confluent T2 hyperintense signal in the periventricular white matter, likely the sequela of moderate chronic small vessel ischemic disease. Vascular: Normal arterial flow voids. Skull and upper cervical spine: Normal marrow signal. Sinuses/Orbits: Mucosal thickening in the left sphenoid sinus. No acute finding in the orbits. Other: Trace fluid in the mastoid air cells. IMPRESSION: 1. No acute intracranial process. No evidence of acute or subacute infarct. 2. 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 Electronically Signed   By: Wiliam Ke M.D.   On: 10/31/2022 19:31   EEG adult  Result Date: 10/31/2022 Charlsie Quest, MD     10/31/2022  2:57 PM Patient Name: Shawna [name] MRN: [id number] Epilepsy Attending: Charlsie Quest Referring Physician/Provider: Rejeana Brock, MD Date:10/31/2022 Duration: 22.39 mins Patient history: 82yo F getting eeg to evaluate for seizure Level of alertness: Awake, asleep AEDs during EEG study: LEV Technical aspects: This EEG study was done with scalp electrodes positioned according to the 10-20 International system of electrode placement. Electrical activity was reviewed with band pass filter of 1-70Hz , sensitivity of 7 uV/mm, display speed of 3mm/sec with a 60Hz  notched filter applied as appropriate. EEG data were recorded continuously and digitally stored.  Video monitoring was available and reviewed as appropriate. Description: The posterior dominant rhythm consists of 8-9 Hz activity of moderate voltage (25-35 uV) seen predominantly in posterior head  regions, symmetric and reactive to eye opening and eye closing. Sleep was characterized by vertex waves, sleep spindles (12 to 14 Hz), maximal frontocentral region.  EEG showed intermittent generalized 3 to 6 Hz theta-delta slowing. Hyperventilation and photic stimulation were not performed.   ABNORMALITY - Intermittent slow, generalized IMPRESSION: This study is suggestive of mild diffuse encephalopathy. No seizures or epileptiform discharges were seen throughout the recording. Charlsie Quest   DG Abd 1 View  Result Date: 10/31/2022 CLINICAL DATA:  Abdominal pain EXAM: ABDOMEN - 1 VIEW COMPARISON:  08/29/2022 FINDINGS: Bowel gas pattern is nonspecific. Gas and stool are noted in colon. Small amount of stool is seen in colon. There are surgical staples and clips in lower abdomen and pelvis. There is no identifiable electronic device in abdomen and pelvis in the current study. In the previous examination, an electronic device, capsule most noted in region of rectum suggesting interval passage. Arterial calcifications are seen. IMPRESSION: Nonspecific bowel gas pattern. There is no evidence of any metallic electronic capsule in the abdomen and pelvis. Electronically Signed   By: Ernie Avena M.D.   On: 10/31/2022 08:40   CT HEAD CODE STROKE WO CONTRAST  Result Date: 10/30/2022 CLINICAL DATA:  Code stroke.  Seizure, altered mental status. EXAM: CT HEAD WITHOUT CONTRAST TECHNIQUE: Contiguous axial images were obtained from the base of the skull through the vertex without intravenous contrast. RADIATION DOSE REDUCTION: This exam was performed according to the departmental dose-optimization program which includes automated exposure control, adjustment of the mA and/or kV according to patient size and/or use of iterative reconstruction technique. COMPARISON:  CT head 08/26/2022. FINDINGS: Brain: There is no evidence of acute territorial infarct. There are low-density subdural collections overlying both  cerebral hemispheres measuring up to 6 mm on the left and 3  mm on the right without evidence of acute blood products, new since the prior head CT from 08/26/2022. There is no mass effect on the underlying brain parenchyma. Parenchymal volume loss with prominence of the ventricular system and extra-axial CSF spaces is stable. The ventricles are unchanged in size and configuration. A remote infarct in the left basal ganglia is unchanged. Additional confluent hypodensity in the supratentorial white matter likely reflects sequela of underlying chronic small-vessel ischemic change. The pituitary and suprasellar region are normal. There is no mass lesion. There is no midline shift. Vascular: There is calcification of the bilateral carotid siphons and vertebral arteries. Skull: Normal. Negative for fracture or focal lesion. Sinuses/Orbits: The paranasal sinuses are clear. The globes and orbits are unremarkable. Other: None. ASPECTS [facility] Stroke Program Early CT Score) - Ganglionic level infarction (caudate, lentiform nuclei, internal capsule, insula, M1-M3 cortex): 7 - Supraganglionic infarction (M4-M6 cortex): 3 Total score (0-10 with 10 being normal): 10 IMPRESSION: 1. No evidence of acute intracranial hemorrhage or acute territorial infarct. 2. Low-density subdural hygromas overlying both cerebral hemispheres measuring up to 6 mm on the left and 3 mm on the right, new since the ct head from 08/26/2022 but without evidence of acute blood within the collections. These results communicated via Amion at the time of interpretation on 10/30/2022 at 6:44 pm to provider [facility] , who verbally acknowledged these results. Electronically Signed   By: Lesia Hausen M.D.   On: 10/30/2022 18:46   DG Chest Port 1 View  Result Date: 10/28/2022 CLINICAL DATA:  Cough EXAM: PORTABLE CHEST 1 VIEW COMPARISON:  10/23/2022 FINDINGS: Prior CABG. Left dialysis catheter remains in place with the tip in the right atrium. Mild  cardiomegaly. No overt edema. Bibasilar atelectasis. No effusions or acute bony abnormality. IMPRESSION: Cardiomegaly, bibasilar atelectasis. Electronically Signed   By: Charlett Nose M.D.   On: 10/28/2022 22:15   CT Chest Wo Contrast  Result Date: 10/23/2022 CLINICAL DATA:  Pneumonia, complication suspected, xray done EXAM: CT CHEST WITHOUT CONTRAST TECHNIQUE: Multidetector CT imaging of the chest was performed following the standard protocol without IV contrast. RADIATION DOSE REDUCTION: This exam was performed according to the departmental dose-optimization program which includes automated exposure control, adjustment of the mA and/or kV according to patient size and/or use of iterative reconstruction technique. COMPARISON:  Radiograph earlier today. Additional prior radiographs reviewed. Most recent chest CT available 07/16/2021 FINDINGS: Cardiovascular: Left-sided dialysis catheter tip is in the atrial caval junction. Mild cardiomegaly. Post CABG with calcification of native coronary arteries. Decreased density of the blood pool typical of anemia. Advanced aortic atherosclerosis with aortic tortuosity. No aneurysm or periaortic stranding. No pericardial effusion. Mediastinum/Nodes: 14 mm lower anterior paratracheal node, similar to 2023 exam. Scattered additional shoddy mediastinal lymph nodes are also unchanged. No progressive adenopathy. Patulous esophagus. Lungs/Pleura: Small bilateral pleural effusions, left slightly larger than right. Slight vascular congestion. Minimal fluid in the fissures. Mild patchy ground-glass opacity in the dependent left greater than right lower lobe. Moderate central bronchial thickening which may be congestive or bronchitic. no endobronchial debris. No pulmonary mass. Upper Abdomen: Sequela of chronic renal disease with renal atrophy and cysts. No specific imaging follow-up of these cysts is recommended. A peripherally calcified density in the right upper kidney is stable  from 2023 exam and consistent with a complex cyst, also needing no further follow-up. Left colonic diverticulosis. Musculoskeletal: Exaggerated thoracic kyphosis with degenerative change throughout the thoracic spine. Chronic mild L1 compression deformity. Slight increase in Schmorl's node inferior  endplate of T11. Prior median sternotomy. There are no acute or suspicious osseous abnormalities. IMPRESSION: 1. Small bilateral pleural effusions, left slightly larger than right. Slight vascular congestion. 2. Mild patchy ground-glass opacity in the dependent left greater than right lower lobe may be atelectasis or pneumonia. 3. Moderate central bronchial thickening may be congestive or bronchitic. Aortic Atherosclerosis (ICD10-I70.0). Electronically Signed   By: Narda Rutherford M.D.   On: 10/23/2022 15:35   DG Chest Port 1 View  Result Date: 10/23/2022 CLINICAL DATA:  82 year old female with cough. Productive cough for 2 weeks. Some shortness of breath. EXAM: PORTABLE CHEST 1 VIEW COMPARISON:  Chest radiographs 10/02/2022 and earlier. FINDINGS: Portable AP upright view at 1107 hours. Prior CABG. Stable cardiomegaly and mediastinal contours. Calcified aortic atherosclerosis. Stable left chest dual lumen dialysis type catheter. Patchy, nonspecific increased left lung base opacity partially obscuring the diaphragm now. No superimposed pneumothorax or consolidation. Stable pulmonary vascularity without overt edema. No confluent right lung opacity. Stable visualized osseous structures.  Negative visible bowel gas. IMPRESSION: 1. Patchy, nonspecific increased left lung base opacity new since last month. PA and lateral views may be helpful if feasible. 2. Otherwise stable cardiomegaly, pulmonary vascularity, prior CABG, chest dialysis catheter, Aortic Atherosclerosis. Electronically Signed   By: Odessa Fleming M.D.   On: 10/23/2022 11:16    Standley Dakins, MD  [facility]  If 7PM-7AM, please contact  night-coverage [email or web address] Password [facility] 11/05/2022, 11:49 AM   LOS: 7 days

## 2022-11-05 NOTE — Plan of Care (Signed)
  Problem: Pain Managment: Goal: General experience of comfort will improve Outcome: Progressing   

## 2022-11-05 NOTE — Progress Notes (Addendum)
Patient continues to have what she describes as generalized abdominal pain.  She has had flatus and multiple bowel movements with MiraLAX since yesterday.  States bowel movements have not really affected abdominal pain.  Vague postprandial component. Seen with nursing staff in attendance.  Vital signs in last 24 hours: Temp:  [97.8 F (36.6 C)-98.4 F (36.9 C)] 98.1 F (36.7 C) (04/27 0448) Pulse Rate:  [51-61] 56 (04/27 0448) Resp:  [14-20] 20 (04/27 0448) BP: (123-184)/(53-73) 166/55 (04/27 0448) SpO2:  [97 %-100 %] 97 % (04/27 0448) Weight:  [56.6 kg-60.2 kg] 56.6 kg (04/26 1730) Last BM Date : 11/01/22 General:   Shawna [name].  Very much conversant.  Appears entirely comfortable.   Abdomen: Nondistended.  Positive bowel sounds she does have mild periumbilical epigastric tenderness to palpation.  No rebound.  No mass.   Intake/Output from previous day: 04/26 0701 - 04/27 0700 In: 590 [P.O.:590] Out: 2000  Intake/Output this shift: No intake/output data recorded.  Lab Results: Recent Labs    11/02/22 1643 11/03/22 1156 11/04/22 0351  WBC  --  5.4 5.6  HGB 10.3* 9.9* 9.2*  HCT 30.3* 30.5* 28.2*  PLT  --  180 183   BMET Recent Labs    11/03/22 1156 11/04/22 0351 11/05/22 0426  NA 133* 132* 133*  K 3.6 4.4 4.3  CL 97* 97* 97*  CO2 26 23 25   GLUCOSE 138* 74 83  BUN 24* 35* 25*  CREATININE 4.62* 5.67* 4.36*  CALCIUM 7.4* 7.3* 7.6*   LFT Recent Labs    11/04/22 0351 11/05/22 0426  PROT 5.3*  --   ALBUMIN 2.7*  2.7* 2.7*  AST 11*  --   ALT 11  --   ALKPHOS 74  --   BILITOT 0.8  --   BILIDIR 0.2  --   IBILI 0.6  --    Impression: Elderly 83 year old [name] with end-stage renal disease on hemodialysis admitted with fatigue acute on chronic anemia.  Gastric ulcer and possible duodenal polyp found and recent EGD. Hemoglobin stable at 9.2 as of last assay yesterday. Gall bladder wall thickening on ultrasound.  I do not believe she has a significant  ileus or small bowel obstruction at this time. She has had good bowel function over the past 24 hours.  She says she continues to have abdominal pain with a vague postprandial component.  Recommendations:  -Continue MiraLAX  - Continue octreotide  - Follow-up on path.  - Recommend HIDA scan to evaluate for cholecystitis to complete her inpatient evaluation.  Addendum: Discussed case at length with Dr. Lovell Sheehan.  We reviewed the most recent CT.  Significant atherosclerosis of the aorta noted.  Mesenteric vasculature not demonstrated well on the study.  Given chronic heart failure and CT findings, agree need to be concerned about mesenteric ischemia as a cause of her abdominal pain.  Will hold off on further gallbladder evaluation pending further evaluation of mesenteric ischemia.

## 2022-11-05 NOTE — Progress Notes (Signed)
Patient rendered Molasses and milk enema with little effectiveness (very small BM) noted after administration, patient c/o stomach pain 8/10, PRN for pain  and high SBP >180 administered, patient currently resting quietly in bedside chair watching TV.

## 2022-11-05 NOTE — Consult Note (Addendum)
Reason for Consult: Abdominal pain Referring Physician: Dr. Caesar Chestnut Shawna [name] is an 82 y.o. female.  HPI: Patient is an 82 year old black female with multiple medical problems including congestive heart failure, chronic anemia, coronary artery disease, and end-stage renal disease on dialysis who presented to [facility] on 10/29/2022 with weakness and generalized malaise after dialysis.  She has had multiple ER visits and admissions in the recent past.  She has had multiple consultations from gastroenterology, nephrology, and neurology.  Patient is a poor historian.  She does states that she has generalized crampy lower abdominal pain that seems to occur in waves.  To me, she denies any right upper quadrant abdominal pain specifically.  She states she is wanting liquids.  She is variable as to whether she wants to eat.  She has been having multiple bowel movements but that does not seem to ease the pain.  Most of her complaints were reviewed on the chart.  Past Medical History:  Diagnosis Date   Acute on chronic respiratory failure with hypoxia (HCC) 10/10/2016   Anxiety    Arthritis    AVM (arteriovenous malformation) of colon    CAD (coronary artery disease)    a. s/p CABG in 2013 b. DES to D1 in 10/2016. c. cath in 07/2018 showing patent grafts with occlusion of D1 at prior stent site and progression of PDA disease --> medical management recommended   Carotid artery disease (HCC)    a. 60-79% LICA, 03/2012    Chronic bronchitis (HCC)    Chronic HFrEF (heart failure with reduced ejection fraction) (HCC)    Colon cancer (HCC) 1992   Esophageal stricture    ESRD on hemodialysis (HCC)    ESRD due to HTN, started dialysis 2011 and gets HD at [facility] with Dr Fausto Skillern on MWF schedule.  Access is LUA AVF as of Sept 2014.    GERD (gastroesophageal reflux disease)    High cholesterol 12/2011   History of blood transfusion 07/2011; 12/2011; 01/2012 X 2; 04/2012   History of gout     History of lower GI bleeding    Hypertension    Iron deficiency anemia    Jugular vein occlusion, right (HCC)    Mitral regurgitation    a. Moderate by echo, 02/2012   Mitral valve disease    NSVT (nonsustained ventricular tachycardia) (HCC)    Ovarian cancer (HCC) 1992   PAF (paroxysmal atrial fibrillation) (HCC)    Pneumonia ~ 2009   PUD (peptic ulcer disease)    TIA (transient ischemic attack)    Tricuspid valve disease     Past Surgical History:  Procedure Laterality Date   A/V FISTULAGRAM Left 10/04/2022   Procedure: A/V Fistulagram;  Surgeon: Renford Dills, MD;  Location: [facility] INVASIVE CV LAB;  Service: Cardiovascular;  Laterality: Left;   A/V SHUNTOGRAM Left 03/19/2019   Procedure: A/V SHUNTOGRAM;  Surgeon: Renford Dills, MD;  Location: [facility] INVASIVE CV LAB;  Service: Cardiovascular;  Laterality: Left;   ABDOMINAL HYSTERECTOMY  1992   APPENDECTOMY  06/1990   AV FISTULA PLACEMENT  07/2009   left upper arm   AV FISTULA PLACEMENT Right 09/06/2016   Procedure: RIGHT FOREARM ARTERIOVENOUS (AV) GRAFT;  Surgeon: Sherren Kerns, MD;  [facility] OR;  Service: Vascular;  Laterality: Right;   AV FISTULA PLACEMENT N/A 02/24/2017   Procedure: INSERTION OF ARTERIOVENOUS (AV) GORE-TEX GRAFT ARM (BRACHIAL AXILLARY);  Surgeon: Renford Dills, MD;  Location: [facility] ORS;  Service: Vascular;  Laterality: N/A;   AVGG REMOVAL Right 09/06/2016   Procedure: REMOVAL OF Right Arm ARTERIOVENOUS GORETEX GRAFT and Vein Patch angioplasty of brachial artery;  Surgeon: Chuck Hint, MD;  Location: [facility] OR;  Service: Vascular;  Laterality: Right;   BIOPSY  09/26/2019   Procedure: BIOPSY;  Surgeon: Malissa Hippo, MD;  [facility];  Service: Endoscopy;;   BIOPSY  11/01/2022   Procedure: BIOPSY;  Surgeon: Dolores Frame, MD;  [facility];  Service: Gastroenterology;;   COLON RESECTION  1992   COLON SURGERY     COLONOSCOPY N/A 03/09/2019   Procedure:  COLONOSCOPY;  Surgeon: Malissa Hippo, MD;  [facility];  Service: Endoscopy;  Laterality: N/A;   COLONOSCOPY N/A 08/11/2022   Procedure: COLONOSCOPY;  Surgeon: Beverley Fiedler, MD;  Location: [facility] ENDOSCOPY;  Service: Gastroenterology;  Laterality: N/A;   COLONOSCOPY WITH PROPOFOL N/A 06/08/2021   Procedure: COLONOSCOPY WITH PROPOFOL;  Surgeon: Dolores Frame, MD;  [facility];  Service: Gastroenterology;  Laterality: N/A;  9:05 /Patient is on dialysis Mon Wed Fri   CORONARY ANGIOPLASTY WITH STENT PLACEMENT  12/15/11   "2"   CORONARY ANGIOPLASTY WITH STENT PLACEMENT  y/2013   "1; makes total of 3" (05/02/2012)   CORONARY ARTERY BYPASS GRAFT  06/13/2012   Procedure: CORONARY ARTERY BYPASS GRAFTING (CABG);  Surgeon: Delight Ovens, MD;  Location: [facility] OR;  Service: Open Heart Surgery;  Laterality: N/A;  cabg x four;  using left internal mammary artery, and left leg greater saphenous vein harvested endoscopically   CORONARY STENT INTERVENTION N/A 10/13/2016   Procedure: Coronary Stent Intervention;  Surgeon: Lennette Bihari, MD;  [facility];  Service: Cardiovascular;  Laterality: N/A;   DIALYSIS/PERMA CATHETER REMOVAL N/A 04/18/2017   Procedure: DIALYSIS/PERMA CATHETER REMOVAL;  Surgeon: Renford Dills, MD;  Location: [facility] INVASIVE CV LAB;  Service: Cardiovascular;  Laterality: N/A;   DILATION AND CURETTAGE OF UTERUS     ENTEROSCOPY N/A 06/08/2021   Procedure: PUSH ENTEROSCOPY;  Surgeon: Dolores Frame, MD;  [facility];  Service: Gastroenterology;  Laterality: N/A;   ENTEROSCOPY N/A 11/01/2022   Procedure: ENTEROSCOPY;  Surgeon: Dolores Frame, MD;  [facility];  Service: Gastroenterology;  Laterality: N/A;   ESOPHAGOGASTRODUODENOSCOPY  01/20/2012   Procedure: ESOPHAGOGASTRODUODENOSCOPY (EGD);  Surgeon: Meryl Dare, MD,FACG;  Location: [facility] ENDOSCOPY;  Service: Endoscopy;  Laterality: N/A;    ESOPHAGOGASTRODUODENOSCOPY N/A 03/26/2013   Procedure: ESOPHAGOGASTRODUODENOSCOPY (EGD);  Surgeon: Hilarie Fredrickson, MD;  Location: [facility] ENDOSCOPY;  Service: Endoscopy;  Laterality: N/A;   ESOPHAGOGASTRODUODENOSCOPY N/A 04/30/2015   Procedure: ESOPHAGOGASTRODUODENOSCOPY (EGD);  Surgeon: Malissa Hippo, MD;  [facility];  Service: Endoscopy;  Laterality: N/A;  1pm - moved to 10/20 @ 1:10   ESOPHAGOGASTRODUODENOSCOPY N/A 07/29/2016   Procedure: ESOPHAGOGASTRODUODENOSCOPY (EGD);  Surgeon: Ruffin Frederick, MD;  Location: [facility] ENDOSCOPY;  Service: Gastroenterology;  Laterality: N/A;  enteroscopy   ESOPHAGOGASTRODUODENOSCOPY N/A 09/26/2019   Procedure: ESOPHAGOGASTRODUODENOSCOPY (EGD);  Surgeon: Malissa Hippo, MD;  [facility];  Service: Endoscopy;  Laterality: N/A;  1250   ESOPHAGOGASTRODUODENOSCOPY N/A 08/11/2022   Procedure: ESOPHAGOGASTRODUODENOSCOPY (EGD);  Surgeon: Beverley Fiedler, MD;  Location: [facility] ENDOSCOPY;  Service: Gastroenterology;  Laterality: N/A;   ESOPHAGOGASTRODUODENOSCOPY (EGD) WITH PROPOFOL N/A 02/05/2021   Procedure: ESOPHAGOGASTRODUODENOSCOPY (EGD) WITH PROPOFOL;  Surgeon: Lanelle Bal, DO;  [facility];  Service: Endoscopy;  Laterality: N/A;  ESOPHAGOGASTRODUODENOSCOPY (EGD) WITH PROPOFOL N/A 08/27/2022   Procedure: ESOPHAGOGASTRODUODENOSCOPY (EGD) WITH PROPOFOL;  Surgeon: Lanelle Bal, DO;  [facility];  Service: Endoscopy;  Laterality: N/A;   GIVENS CAPSULE STUDY N/A 03/07/2019   Procedure: GIVENS CAPSULE STUDY;  Surgeon: Malissa Hippo, MD;  [facility];  Service: Endoscopy;  Laterality: N/A;  7:30   GIVENS CAPSULE STUDY N/A 04/22/2021   Procedure: GIVENS CAPSULE STUDY;  Surgeon: Malissa Hippo, MD;  [facility];  Service: Endoscopy;  Laterality: N/A;  7:30   GIVENS CAPSULE STUDY N/A 08/27/2022   Procedure: GIVENS CAPSULE STUDY;  Surgeon: Lanelle Bal, DO;  [facility];  Service:  Endoscopy;  Laterality: N/A;   HOT HEMOSTASIS N/A 08/11/2022   Procedure: HOT HEMOSTASIS (ARGON PLASMA COAGULATION/BICAP);  Surgeon: Beverley Fiedler, MD;  Location: [facility] ENDOSCOPY;  Service: Gastroenterology;  Laterality: N/A;   INSERTION OF DIALYSIS CATHETER N/A 10/05/2020   Procedure: ABORTED TUNNELED DIALYSIS CATHETER PLACEMENT RIGHT INTERNAL JUGULAR VEIN ;  Surgeon: Lucretia Roers, MD;  Location: [facility]S;  Service: General;  Laterality: N/A;   INTRAOPERATIVE TRANSESOPHAGEAL ECHOCARDIOGRAM  06/13/2012   Procedure: INTRAOPERATIVE TRANSESOPHAGEAL ECHOCARDIOGRAM;  Surgeon: Delight Ovens, MD;  Location: [facility] OR;  Service: Open Heart Surgery;  Laterality: N/A;   IR DIALY SHUNT INTRO NEEDLE/INTRACATH INITIAL W/IMG LEFT Left 10/06/2020   IR FLUORO GUIDE CV LINE RIGHT  06/17/2020   IR GENERIC HISTORICAL  07/26/2016   IR FLUORO GUIDE CV LINE RIGHT 07/26/2016 Berdine Dance, MD [facility]   IR GENERIC HISTORICAL  07/26/2016   IR US GUIDE VASC ACCESS RIGHT 07/26/2016 Berdine Dance, MD [facility]   IR GENERIC HISTORICAL  08/02/2016   IR US GUIDE VASC ACCESS RIGHT 08/02/2016 Berdine Dance, MD [facility]   IR GENERIC HISTORICAL  08/02/2016   IR FLUORO GUIDE CV LINE RIGHT 08/02/2016 Berdine Dance, MD [facility]   IR RADIOLOGY PERIPHERAL GUIDED IV START  03/28/2017   IR REMOVAL TUN CV CATH W/O FL  08/11/2020   IR THROMBECTOMY AV FISTULA W/THROMBOLYSIS INC/SHUNT/IMG LEFT Left 06/17/2020   IR US GUIDE VASC ACCESS LEFT  06/17/2020   IR US GUIDE VASC ACCESS RIGHT  03/28/2017   IR US GUIDE VASC ACCESS RIGHT  06/17/2020   LEFT HEART CATH AND CORONARY ANGIOGRAPHY N/A 09/20/2016   Procedure: Left Heart Cath and Coronary Angiography;  Surgeon: Lyn Records, MD;  Location: [facility] INVASIVE CV LAB;  Service: Cardiovascular;  Laterality: N/A;   LEFT HEART CATH AND CORS/GRAFTS ANGIOGRAPHY N/A 10/13/2016   Procedure: Left Heart Cath and Cors/Grafts Angiography;  Surgeon: Lennette Bihari, MD;  [facility];  Service:  Cardiovascular;  Laterality: N/A;   LEFT HEART CATH AND CORS/GRAFTS ANGIOGRAPHY N/A 07/13/2018   Procedure: LEFT HEART CATH AND CORS/GRAFTS ANGIOGRAPHY;  Surgeon: Swaziland, Peter M, MD;  Location: [facility] INVASIVE CV LAB;  Service: Cardiovascular;  Laterality: N/A;   LEFT HEART CATH AND CORS/GRAFTS ANGIOGRAPHY N/A 07/22/2021   Procedure: LEFT HEART CATH AND CORS/GRAFTS ANGIOGRAPHY;  Surgeon: Runell Gess, MD;  [facility];  Service: Cardiovascular;  Laterality: N/A;   LEFT HEART CATHETERIZATION WITH CORONARY ANGIOGRAM N/A 12/15/2011   Procedure: LEFT HEART CATHETERIZATION WITH CORONARY ANGIOGRAM;  Surgeon: Kathleene Hazel, MD;  Location: [facility] CATH LAB;  Service: Cardiovascular;  Laterality: N/A;   LEFT HEART CATHETERIZATION WITH CORONARY ANGIOGRAM N/A 01/10/2012   Procedure: LEFT HEART CATHETERIZATION WITH CORONARY ANGIOGRAM;  Surgeon: Peter M Swaziland,  MD;  Location[facility] CATH LAB;  Service: Cardiovascular;  Laterality: N/A;   LEFT HEART CATHETERIZATION WITH CORONARY ANGIOGRAM N/A 06/08/2012   Procedure: LEFT HEART CATHETERIZATION WITH CORONARY ANGIOGRAM;  Surgeon: Kathleene Hazel, MD;  Location: [facility] CATH LAB;  Service: Cardiovascular;  Laterality: N/A;   LEFT HEART CATHETERIZATION WITH CORONARY/GRAFT ANGIOGRAM N/A 12/10/2013   Procedure: LEFT HEART CATHETERIZATION WITH Isabel Caprice;  Surgeon: Corky Crafts, MD;  Location: [facility] CATH LAB;  Service: Cardiovascular;  Laterality: N/A;   OVARY SURGERY     ovarian cancer   POLYPECTOMY  03/09/2019   Procedure: POLYPECTOMY;  Surgeon: Malissa Hippo, MD;  [facility];  Service: Endoscopy;;  cecal    POLYPECTOMY N/A 09/26/2019   Procedure: DUODENAL POLYPECTOMY;  Surgeon: Malissa Hippo, MD;  [facility];  Service: Endoscopy;  Laterality: N/A;   POLYPECTOMY  08/11/2022   Procedure: POLYPECTOMY;  Surgeon: Beverley Fiedler, MD;  Location: [facility] ENDOSCOPY;  Service: Gastroenterology;;   REVISION OF  ARTERIOVENOUS GORETEX GRAFT N/A 02/24/2017   Procedure: REVISION OF ARTERIOVENOUS GORETEX GRAFT (RESECTION);  Surgeon: Renford Dills, MD;  Location: [facility] ORS;  Service: Vascular;  Laterality: N/A;   REVISON OF ARTERIOVENOUS FISTULA Left 06/19/2020   Procedure: REVISION OF LEFT UPPER ARM AV GRAFT WITH INTERPOSITION JUMP GRAFT USING GORE LIMB;  Surgeon: Cephus Shelling, MD;  Location: [facility] OR;  Service: Vascular;  Laterality: Left;   SHUNTOGRAM N/A 10/15/2013   Procedure: Fistulogram;  Surgeon: Nada Libman, MD;  Location: [facility] CATH LAB;  Service: Cardiovascular;  Laterality: N/A;   SUBMUCOSAL TATTOO INJECTION  11/01/2022   Procedure: SUBMUCOSAL TATTOO INJECTION;  Surgeon: Dolores Frame, MD;  [facility];  Service: Gastroenterology;;   THROMBECTOMY / ARTERIOVENOUS GRAFT REVISION  2011   left upper arm   TUBAL LIGATION  1980's   UPPER EXTREMITY ANGIOGRAPHY Bilateral 12/06/2016   Procedure: Upper Extremity Angiography;  Surgeon: Renford Dills, MD;  Location: [facility] INVASIVE CV LAB;  Service: Cardiovascular;  Laterality: Bilateral;   UPPER EXTREMITY INTERVENTION Left 06/06/2017   Procedure: UPPER EXTREMITY INTERVENTION;  Surgeon: Renford Dills, MD;  Location: [facility] INVASIVE CV LAB;  Service: Cardiovascular;  Laterality: Left;   UPPER EXTREMITY VENOGRAPHY Left 10/04/2022   Procedure: UPPER EXTREMITY VENOGRAPHY;  Surgeon: Renford Dills, MD;  Location: [facility] INVASIVE CV LAB;  Service: Cardiovascular;  Laterality: Left;    Family History  Problem Relation Age of Onset   Heart disease Mother        Heart Disease before age 78   Hyperlipidemia Mother    Hypertension Mother    Diabetes Mother    Heart attack Mother    Heart disease Father        Heart Disease before age 87   Hyperlipidemia Father    Hypertension Father    Diabetes Father    Diabetes Sister    Hypertension Sister    Diabetes Brother    Hyperlipidemia Brother    Heart attack Brother     Hypertension Sister    Heart attack Brother    Colon cancer Child 58   Other Other        noncontributory for early CAD   Esophageal cancer Neg Hx    Liver disease Neg Hx    Kidney disease Neg Hx    Colon polyps Neg Hx     Social History:  reports that she has never smoked. She has never used smokeless tobacco. She reports  that she does not drink alcohol and does not use drugs.  Allergies:  Allergies  Allergen Reactions   Amlodipine Swelling   Aspirin Other (See Comments)    High Doses Mess up her stomach; "makes my bowels have blood in them". Takes 81 mg EC Aspirin    Nitrofurantoin Hives   Ranexa [Ranolazine] Other (See Comments)    Myoclonus-hospitalized    Bactrim [Sulfamethoxazole-Trimethoprim] Rash   Contrast Media [Iodinated Contrast Media] Itching   Iron Itching and Other (See Comments)    "they gave me iron in dialysis; had to give me Benadryl cause I had to have the iron" (05/02/2012)   Gabapentin Other (See Comments)    Unknown reaction   Iron Sucrose Other (See Comments)    Unknown   Sucroferric Oxyhydroxide Other (See Comments)    Unknown   Levaquin [Levofloxacin In D5w] Rash   Pantoprazole Rash   Plavix [Clopidogrel Bisulfate] Rash   Protonix [Pantoprazole Sodium] Rash   Venofer [Ferric Oxide] Itching and Other (See Comments)    Patient reports using Benadryl prior to doses as [facility]    Medications: I have reviewed the patient's current medications.  Results for orders placed or performed during the hospital encounter of 10/28/22 (from the past 48 hour(s))  Comprehensive metabolic panel     Status: Abnormal   Collection Time: 11/03/22 11:56 AM  Result Value Ref Range   Sodium 133 (L) 135 - 145 mmol/L   Potassium 3.6 3.5 - 5.1 mmol/L   Chloride 97 (L) 98 - 111 mmol/L   CO2 26 22 - 32 mmol/L   Glucose, Bld 138 (H) 70 - 99 mg/dL    Comment: Glucose reference range applies only to samples taken after fasting for at least 8 hours.   BUN 24 (H) 8 -  23 mg/dL   Creatinine, Ser 1.61 (H) 0.44 - 1.00 mg/dL   Calcium 7.4 (L) 8.9 - 10.3 mg/dL   Total Protein 5.9 (L) 6.5 - 8.1 g/dL   Albumin 3.0 (L) 3.5 - 5.0 g/dL   AST 12 (L) 15 - 41 U/L   ALT 12 0 - 44 U/L   Alkaline Phosphatase 85 38 - 126 U/L   Total Bilirubin 0.2 (L) 0.3 - 1.2 mg/dL   GFR, Estimated 9 (L) >60 mL/min    Comment: (NOTE) Calculated using the CKD-EPI Creatinine Equation (2021)    Anion gap 10 5 - 15    Comment: Performed at [facility], [address] [name], [place] [address]  CBC     Status: Abnormal   Collection Time: 11/03/22 11:56 AM  Result Value Ref Range   WBC 5.4 4.0 - 10.5 K/uL   RBC 2.80 (L) 3.87 - 5.11 MIL/uL   Hemoglobin 9.9 (L) 12.0 - 15.0 g/dL   HCT 54.0 (L) 98.1 - 19.1 %   MCV 108.9 (H) 80.0 - 100.0 fL   MCH 35.4 (H) 26.0 - 34.0 pg   MCHC 32.5 30.0 - 36.0 g/dL   RDW 47.8 (H) 29.5 - 62.1 %   Platelets 180 150 - 400 K/uL   nRBC 0.0 0.0 - 0.2 %    Comment: Performed at [facility], [address], [place], [place] [address]  Glucose, capillary     Status: Abnormal   Collection Time: 11/03/22  8:20 PM  Result Value Ref Range   Glucose-Capillary 200 (H) 70 - 99 mg/dL    Comment: Glucose reference range applies only to samples taken after fasting for at least  8 hours.  CBC     Status: Abnormal   Collection Time: 11/04/22  3:51 AM  Result Value Ref Range   WBC 5.6 4.0 - 10.5 K/uL   RBC 2.62 (L) 3.87 - 5.11 MIL/uL   Hemoglobin 9.2 (L) 12.0 - 15.0 g/dL   HCT 16.1 (L) 09.6 - 04.5 %   MCV 107.6 (H) 80.0 - 100.0 fL   MCH 35.1 (H) 26.0 - 34.0 pg   MCHC 32.6 30.0 - 36.0 g/dL   RDW 40.9 (H) 81.1 - 91.4 %   Platelets 183 150 - 400 K/uL    Comment: REPEATED TO VERIFY   nRBC 0.0 0.0 - 0.2 %    Comment: Performed at [facility], [address], [place], [place] [address]  Renal function panel     Status: Abnormal   Collection Time: 11/04/22  3:51 AM  Result Value Ref Range   Sodium 132 (L) 135 - 145 mmol/L   Potassium 4.4 3.5 - 5.1 mmol/L    Chloride 97 (L) 98 - 111 mmol/L   CO2 23 22 - 32 mmol/L   Glucose, Bld 74 70 - 99 mg/dL    Comment: Glucose reference range applies only to samples taken after fasting for at least 8 hours.   BUN 35 (H) 8 - 23 mg/dL   Creatinine, Ser 6.21 (H) 0.44 - 1.00 mg/dL   Calcium 7.3 (L) 8.9 - 10.3 mg/dL   Phosphorus 5.6 (H) 2.5 - 4.6 mg/dL   Albumin 2.7 (L) 3.5 - 5.0 g/dL   GFR, Estimated 7 (L) >60 mL/min    Comment: (NOTE) Calculated using the CKD-EPI Creatinine Equation (2021)    Anion gap 12 5 - 15    Comment: Performed at [facility], [address], [place], [place] [address]  Hepatic function panel     Status: Abnormal   Collection Time: 11/04/22  3:51 AM  Result Value Ref Range   Total Protein 5.3 (L) 6.5 - 8.1 g/dL   Albumin 2.7 (L) 3.5 - 5.0 g/dL   AST 11 (L) 15 - 41 U/L   ALT 11 0 - 44 U/L   Alkaline Phosphatase 74 38 - 126 U/L   Total Bilirubin 0.8 0.3 - 1.2 mg/dL   Bilirubin, Direct 0.2 0.0 - 0.2 mg/dL   Indirect Bilirubin 0.6 0.3 - 0.9 mg/dL    Comment: Performed at [facility], [address] [place], [place] [address]  Renal function panel     Status: Abnormal   Collection Time: 11/05/22  4:26 AM  Result Value Ref Range   Sodium 133 (L) 135 - 145 mmol/L   Potassium 4.3 3.5 - 5.1 mmol/L   Chloride 97 (L) 98 - 111 mmol/L   CO2 25 22 - 32 mmol/L   Glucose, Bld 83 70 - 99 mg/dL    Comment: Glucose reference range applies only to samples taken after fasting for at least 8 hours.   BUN 25 (H) 8 - 23 mg/dL   Creatinine, Ser 6.29 (H) 0.44 - 1.00 mg/dL   Calcium 7.6 (L) 8.9 - 10.3 mg/dL   Phosphorus 4.9 (H) 2.5 - 4.6 mg/dL   Albumin 2.7 (L) 3.5 - 5.0 g/dL   GFR, Estimated 10 (L) >60 mL/min    Comment: (NOTE) Calculated using the CKD-EPI Creatinine Equation (2021)    Anion gap 11 5 - 15    Comment: Performed at [facility], [address] [place], [place] [address]   *Note: Due to a large number  of results and/or encounters for the requested time period, some results  have not been displayed. A complete set of results can be found in Results Review.    DG Abd Portable 2V  Result Date: 11/05/2022 CLINICAL DATA:  Abdominal pain EXAM: PORTABLE ABDOMEN - 2 VIEW COMPARISON:  CT 11/02/2022 FINDINGS: No dilated loops of large or small bowel identified. Increasing stool burden identified within the colon. No air-fluid levels identified. Surgical clips noted within the pelvis. Left lung base opacity identified within the imaged portions of the lower chest. Patient has a left chest wall dialysis catheter with tips in the right atrium. Signs of previous CABG procedure. IMPRESSION: 1. Nonobstructive bowel gas pattern. 2. Increasing stool burden within the colon. 3. Left base opacity identified within the imaged portions of the chest. Correlate for any clinical signs/symptoms of pneumonia. Electronically Signed   By: Signa Kell M.D.   On: 11/05/2022 10:27   US Abdomen Limited RUQ (LIVER/GB)  Result Date: 11/03/2022 CLINICAL DATA:  Right upper quadrant pain EXAM: ULTRASOUND ABDOMEN LIMITED RIGHT UPPER QUADRANT COMPARISON:  CT 11/02/2022.  Older exams as well. FINDINGS: Gallbladder: Gallbladder is mildly distended. No shadowing stones or adjacent fluid but there is gallbladder wall thickening. Common bile duct: Diameter: 4 mm Liver: No focal lesion identified. Within normal limits in parenchymal echogenicity. Portal vein is patent on color Doppler imaging with normal direction of blood flow towards the liver. Other: None. IMPRESSION: No shadowing stones. The gallbladder wall is thickened which is nonspecific. No adjacent fluid. No reported sonographic Murphy sign. Please correlate with clinical symptomatology. If there is further concern, HIDA scan could be considered as clinically appropriate. No biliary ductal dilatation Electronically Signed   By: Karen Kays M.D.   On: 11/03/2022 14:59    ROS:  Review of systems not obtained due to patient factors.    Blood pressure (!)  166/55, pulse (!) 56, temperature 98.1 F (36.7 C), resp. rate 20, height 5\' 5"  (1.651 m), weight 56.6 kg, SpO2 97 %. Physical Exam: Pleasant black female no acute distress Head is normocephalic, atraumatic Eyes are without scleral icterus Lungs are relatively clear to auscultation but poor inspiratory effort was noted. Heart examination reveals a regular rate and rhythm Abdomen is distended but not tense.  She does have occasional bowel sounds.  She is somewhat bloated with discomfort to palpation in the lower half of the abdomen.  No rigidity is noted.  CT scan images personally reviewed, ultrasound and MRI reports reviewed.  Assessment/Plan: Impression: Nonspecific abdominal pain.  I suspect this is multifactorial in nature.  She does have a history of a cardiac ejection fraction of around 30%.  This may be causing some right-sided heart failure with liver and gallbladder congestion.  She does not seem to have specific symptoms of biliary colic.  Her LFTs are within normal limits.  Given her extensive atherosclerotic disease of the aorta and the superior mesenteric artery, I am concerned that she may have an element of mesenteric angina aggravated by her low ejection fraction.  She does not have an acute abdomen.  She is a poor candidate for any surgical intervention.  I doubt further evaluation of her mesenteric vessels would be of benefit given the extensive calcification.  I do not think a HIDA scan will be helpful as she has no liver abnormalities and would not require a cholecystostomy tube.  Will discuss with Dr. [name].  May need IR review.  Franky Macho 11/05/2022, 11:19 AM

## 2022-11-06 DIAGNOSIS — Z992 Dependence on renal dialysis: Secondary | ICD-10-CM | POA: Diagnosis not present

## 2022-11-06 DIAGNOSIS — K259 Gastric ulcer, unspecified as acute or chronic, without hemorrhage or perforation: Secondary | ICD-10-CM | POA: Diagnosis not present

## 2022-11-06 DIAGNOSIS — D649 Anemia, unspecified: Secondary | ICD-10-CM | POA: Diagnosis not present

## 2022-11-06 DIAGNOSIS — K559 Vascular disorder of intestine, unspecified: Secondary | ICD-10-CM | POA: Diagnosis not present

## 2022-11-06 DIAGNOSIS — K5900 Constipation, unspecified: Secondary | ICD-10-CM

## 2022-11-06 DIAGNOSIS — Z9189 Other specified personal risk factors, not elsewhere classified: Secondary | ICD-10-CM

## 2022-11-06 DIAGNOSIS — N186 End stage renal disease: Secondary | ICD-10-CM | POA: Diagnosis not present

## 2022-11-06 DIAGNOSIS — K31811 Angiodysplasia of stomach and duodenum with bleeding: Secondary | ICD-10-CM | POA: Diagnosis not present

## 2022-11-06 NOTE — Progress Notes (Addendum)
5 Days Post-Op  Subjective: Patient states she has had pain relief from her enemas and she reports having 2 bowel movements.  She ate a regular breakfast this morning and has no abdominal pain except for mild discomfort in the lower abdomen.  Eating did not aggravate her pain at the present time.  Objective: Vital signs in last 24 hours: Temp:  [98.7 F (37.1 C)] 98.7 F (37.1 C) (04/27 1318) Pulse Rate:  [59-64] 60 (04/28 0544) Resp:  [18-20] 18 (04/28 0544) BP: (142-188)/(49-84) 154/62 (04/28 0544) SpO2:  [98 %] 98 % (04/28 0544) Last BM Date : 11/05/22  Intake/Output from previous day: 04/27 0701 - 04/28 0700 In: 533.5 [P.O.:240; IV Piggyback:293.5] Out: -  Intake/Output this shift: No intake/output data recorded.  General appearance: alert, cooperative, and no distress GI: Soft, nontender.  Some discomfort to deep palpation lower abdomen which is significantly less than yesterday.  Lab Results:  Recent Labs    11/03/22 1156 11/04/22 0351  WBC 5.4 5.6  HGB 9.9* 9.2*  HCT 30.5* 28.2*  PLT 180 183   BMET Recent Labs    11/04/22 0351 11/05/22 0426  NA 132* 133*  K 4.4 4.3  CL 97* 97*  CO2 23 25  GLUCOSE 74 83  BUN 35* 25*  CREATININE 5.67* 4.36*  CALCIUM 7.3* 7.6*   PT/INR No results for input(s): "LABPROT", "INR" in the last 72 hours.  Studies/Results: DG Abd Portable 2V  Result Date: 11/05/2022 CLINICAL DATA:  Abdominal pain EXAM: PORTABLE ABDOMEN - 2 VIEW COMPARISON:  CT 11/02/2022 FINDINGS: No dilated loops of large or small bowel identified. Increasing stool burden identified within the colon. No air-fluid levels identified. Surgical clips noted within the pelvis. Left lung base opacity identified within the imaged portions of the lower chest. Patient has a left chest wall dialysis catheter with tips in the right atrium. Signs of previous CABG procedure. IMPRESSION: 1. Nonobstructive bowel gas pattern. 2. Increasing stool burden within the colon. 3. Left  base opacity identified within the imaged portions of the chest. Correlate for any clinical signs/symptoms of pneumonia. Electronically Signed   By: Signa Kell M.D.   On: 11/05/2022 10:27    Anti-infectives: Anti-infectives (From admission, onward)    Start     Dose/Rate Route Frequency Ordered Stop   11/03/22 1400  piperacillin-tazobactam (ZOSYN) 2.25 g in sodium chloride 0.9 % 50 mL IVPB        2.25 g 100 mL/hr over 30 Minutes Intravenous Every 8 hours 11/03/22 1025     11/02/22 2200  piperacillin-tazobactam (ZOSYN) IVPB 2.25 g  Status:  Discontinued        2.25 g 100 mL/hr over 30 Minutes Intravenous Every 8 hours 11/02/22 1826 11/03/22 1026       Assessment/Plan: s/p Procedure(s): ENTEROSCOPY BIOPSY SUBMUCOSAL TATTOO INJECTION Impression: Patient's abdominal pain has significantly decreased.  She is tolerating a regular diet well.  She has had some bowel movements and states she feels better.  IR note appreciated.  Doubt need for HIDA scan at the present time.  Will discuss further management with Dr. [name].  LOS: 8 days    Franky Macho 11/06/2022

## 2022-11-06 NOTE — Progress Notes (Signed)
Appreciate input from Dr. Lovell Sheehan and IR.  Tolerated pancakes and bacon this morning without any abdominal pain.  Bowel movements with enemas yesterday  Up in room today.  States her abdominal pain is better.  Biopsies from enteroscopy (no evidence of H. pylori, jejunal adenoma)   Vital signs in last 24 hours: Temp:  [98.7 F (37.1 C)] 98.7 F (37.1 C) (04/27 1318) Pulse Rate:  [59-64] 60 (04/28 0544) Resp:  [18-20] 18 (04/28 0544) BP: (142-188)/(49-84) 154/62 (04/28 0544) SpO2:  [98 %] 98 % (04/28 0544) Last BM Date : 11/05/22 General:   Alert,  Well-developed, well-nourished, pleasant and cooperative in NAD Abdomen:  Soft, nontender and nondistended.  Normal bowel sounds, without guarding, and without rebound.  No mass or organomegaly. Extremities:  Without clubbing or edema.    Intake/Output from previous day: 04/27 0701 - 04/28 0700 In: 533.5 [P.O.:240; IV Piggyback:293.5] Out: -  Intake/Output this shift: No intake/output data recorded.  Lab Results: Recent Labs    11/03/22 1156 11/04/22 0351  WBC 5.4 5.6  HGB 9.9* 9.2*  HCT 30.5* 28.2*  PLT 180 183   BMET Recent Labs    11/03/22 1156 11/04/22 0351 11/05/22 0426  NA 133* 132* 133*  K 3.6 4.4 4.3  CL 97* 97* 97*  CO2 26 23 25   GLUCOSE 138* 74 83  BUN 24* 35* 25*  CREATININE 4.62* 5.67* 4.36*  CALCIUM 7.4* 7.3* 7.6*   LFT Recent Labs    11/04/22 0351 11/05/22 0426  PROT 5.3*  --   ALBUMIN 2.7*  2.7* 2.7*  AST 11*  --   ALT 11  --   ALKPHOS 74  --   BILITOT 0.8  --   BILIDIR 0.2  --   IBILI 0.6  --    PT/INR No results for input(s): "LABPROT", "INR" in the last 72 hours. Hepatitis Panel No results for input(s): "HEPBSAG", "HCVAB", "HEPAIGM", "HEPBIGM" in the last 72 hours. C-Diff No results for input(s): "CDIFFTOX" in the last 72 hours.  Studies/Results: DG Abd Portable 2V  Result Date: 11/05/2022 CLINICAL DATA:  Abdominal pain EXAM: PORTABLE ABDOMEN - 2 VIEW COMPARISON:  CT 11/02/2022  FINDINGS: No dilated loops of large or small bowel identified. Increasing stool burden identified within the colon. No air-fluid levels identified. Surgical clips noted within the pelvis. Left lung base opacity identified within the imaged portions of the lower chest. Patient has a left chest wall dialysis catheter with tips in the right atrium. Signs of previous CABG procedure. IMPRESSION: 1. Nonobstructive bowel gas pattern. 2. Increasing stool burden within the colon. 3. Left base opacity identified within the imaged portions of the chest. Correlate for any clinical signs/symptoms of pneumonia. Electronically Signed   By: Signa Kell M.D.   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  Abdominal pain has improved with improved bowel function.  Concerns for possible underlying symptomatic gallbladder disease and mesenteric ischemia have been considered.  At this time, current clinical course is more consistent with constipation.  I had a lengthy discussion with patient and her husband, Mr. Tindall, in the room today.  If patient develops recurrent symptoms, will need to consider mesenteric ischemia further.  CTA would be needed.  Contrast allergy noted in the setting of chronic kidney disease on hemodialysis.  CTA could be done in coordination with dialysis along with utilizing prednisone/Benadryl protocol.  Recommendations -  For now, aggressively manage constipation.  MiraLAX twice daily to 3 times daily as needed. -Continue octreotide -Arrange follow-up with Dr. Levon Hedger in about 3 to 4 weeks -If recurrent abdominal pain in the setting of good bowel function, further evaluation of mesenteric vasculature would be indicated.

## 2022-11-06 NOTE — Progress Notes (Addendum)
PROGRESS NOTE  Shawna [name] ZOX:[id number] DOB: 04/05/1941 DOA: 10/28/2022 PCP: [facility]  Brief History:  82 year old female with a history of ESRD (MWF), coronary artery disease status post CABG, CHF, hypertension, hyperlipidemia, paroxysmal atrial fibrillation, hyperlipidemia, GI bleed presenting with generalized weakness and not feeling well after dialysis. At the time of admission, the patient was noted to have a hemoglobin of 7.1.  She was transfused 2 units PRBC.  GI was consulted to assist with management. Unfortunately, the patient had a rapid response called on the evening of 10/30/2022 secondary to altered mental status.  Apparently, the patient was noted to have seizure-like activity by nursing staff.  Apparently the patient was lethargic minimally responsive to painful stimuli.  There were shaking activity lasting 3 minutes noted by nursing staff.  Code stroke was activated.  CT of the brain was negative for hemorrhage.  There was some low-density subdural hygromas.  The patient was seen by teleneurology.  CTA of the head and neck was recommended, but family refused secondary to the patient's contrast allergy.  The patient was loaded with Keppra.  EEG was ordered.  They felt that the patient could stay at [facility] as long as she improves and follows commands.  She returns her to the ICU.  As the evening progressed, the patient was more responsive.  Apparently, the patient was refusing to follow commands but would speak when probed and speech was clear.  The patient did follow some simple commands.  The patient had recent hospitalization from 08/26/22 to 08/29/22 secondary to acute on chronic blood loss anemia.  Hemoccult was positive at that time.  The patmultiple gastric AVMs treated with APC, small hiatal hernia, no active or stigmata of bleeding.  Capsule was placed placed during EGD and showed few small AVMs in the small bowel, likely jejunum.  2 small polyps in  the distal small bowel previously noted. Capsule appears to sit at the ileocolonic anastomosis for the last 2 to 3 hours of study.  She was transfused 2 units PRBC during that hospital admission.  She is not felt to be a candidate for anticoagulation due to her recurrent GI bleeds.  The patient was noted to have hospital delirium and felt to have a degree of underlying cognitive impairment.  She also had a previous hospital admission from 08/06/2022 to 08/17/2022 due to acute on chronic encephalopathy during the hospitalization.  At best, it appears that the patient was oriented only to person at that time.  Family has noted that the patient has had confusion at home since wintertime 2023.  Once again, she had acute on chronic anemia.  She underwent EGD and colonoscopy on 08/10/2022.duodenal and colonic polyps which were removed. 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  She was seen by palliative medicine and full scope of care was continued.   Assessment/Plan: Acute on chronic blood loss anemia -GI consult appreciated -Transfused 1 unit PRBC this admission -Presented with hemoglobin 7.1 -Hemoglobin remained stable since transfusion -Blood loss felt to be secondary to the patient's gastric and small bowel AVMs -4/23 SB enteroscopy--nonbleeding gastric ulcer with clean base; non bleeding jejunal diverticula; no AVMs -she is treated with BID octreotide injections -Hgb remains largely stable around 8    Myoclonus/Seizure like activity -The patient has had similar episodes of what was described on the evening of 10/30/2022 in the past previously felt to be secondary to anemia and gabapentin in the setting of ESRD patient -  Appreciate neurology consult>>loaded keppra -Continue Keppra per neurologist - no further episodes on keppra   Generalized Abdominal Pain  -CT abd/pelvis - suggesting mild ileus and cholelithiasis  -[place] RUQ completed for further gallbladder work up - HIDA scan not recommended by surgical  team -tolerating diet, but endorses post-prandial pain - improved after enema given 4/27 -laxatives added for constipation / mild ileus  -will ask for surgery eval - see surgery consult and recs -considered underlying mesenteric angina, poor candidate for surgical intervention, discussed with surgeon, will ask IR to evaluate if she could be a candidate for palliative stenting, pt would need CTA study to further evaluate for mesenteric ischemia if pain returns and would likely need to be premedicated and coordinated with dialysis. -treating constipation, MOM enema ordered 4/27 -GI team intensified bowel regimen   ESRD -Dialyzes Monday, Wednesday, Friday -last dialysis 11/02/2022 -Appreciate nephrology follow-up   Paroxysmal Atrial fibrillation/SVT -Not a candidate for anticoagulation secondary to recurrent GI bleed -Continue amiodarone and metoprolol  -currently in sinus, but has intermittent episodes of SVT   Essential hypertension -Continue home medications-metoprolol and lisinopril -Well-documented that the patient has had uncontrolled hypertension improved with HD volume removal   Coronary Artery Disease s/p CABG in 2013 with DES to D1 in 10/2016 with cath showing patent LIMA-LAD, SVG-RI, SVG-Mrg and SVG-RCA and repeat cath in 07/2018 showing patent grafts with occlusion of D1 at prior stent site and progression of PDA disease and medical management was recommended -continue imdur, ASA 81 mg, simavastatin   Hypothyroidism -Continue home dose levothyroxine     Cognitive impairment -Documented over the last several hospitalization the patient has had baseline cognitive impairment and hospital delirium -She is at high risk for recurrent delirium due to her metabolic derangement and hospital stays -10/30/2022 CT brain negative -continue delirium precautions and frequent family visitation       Family Communication:     Consultants:  GI, neurology, renal, surgery   Code Status:   FULL    DVT Prophylaxis:  SCDs  DISPO:  Anticipate DC home tomorrow with husband after hemodialysis completed      Procedures: As Listed in Progress Note Above   Antibiotics: None   Subjective: Pt had a couple of good bowel movements after enema yesterday and abdominal pain resolved today.  Pt tolerated breakfast with no pain.     Objective: Vitals:   11/05/22 1944 11/05/22 1959 11/06/22 0544 11/06/22 1422  BP: (!) 180/84  (!) 154/62 (!) 196/78  Pulse: 60 64 60 (!) 56  Resp: 20  18 20   Temp:    (!) 97.5 F (36.4 C)  TempSrc:    Oral  SpO2: 98%  98% 100%  Weight:      Height:        Intake/Output Summary (Last 24 hours) at 11/06/2022 1646 Last data filed at 11/06/2022 1300 Gross per 24 hour  Intake 580 ml  Output --  Net 580 ml   Weight change:  Exam:  General:  Pt appears chronically ill and intermittently confused, eating breakfast, no distress noted HEENT: supple neck, No icterus, No thrush, No neck mass, [place]/AT Cardiovascular: normal S1/S2, no rubs, no gallops Respiratory: CTA bilaterally, no wheezing, no crackles, no rhonchi Abdomen: Soft/+BS, generalized abd tenderness, non distended, no guarding Extremities: trace pretibial edema, No lymphangitis, No petechiae, No rashes, no synovitis  Data Reviewed: I have personally reviewed following labs and imaging studies Basic Metabolic Panel: Recent Labs  Lab 10/30/22 1900 10/31/22 0541 11/01/22 1055 11/02/22  1610 11/03/22 1156 11/04/22 0351 11/05/22 0426  NA 133*   < > 132* 131* 133* 132* 133*  K 5.0   < > 3.9 3.9 3.6 4.4 4.3  CL 100   < > 97* 98 97* 97* 97*  CO2 15*   < > 25 21* 26 23 25   GLUCOSE 193*   < > 87 103* 138* 74 83  BUN 55*   < > 30* 34* 24* 35* 25*  CREATININE 6.72*   < > 5.05* 5.97* 4.62* 5.67* 4.36*  CALCIUM 7.4*   < > 7.2* 7.2* 7.4* 7.3* 7.6*  MG 1.8  --   --   --   --   --   --   PHOS  --   --   --  5.3*  --  5.6* 4.9*   < > = values in this interval not displayed.   Liver Function  Tests: Recent Labs  Lab 10/30/22 1900 11/02/22 0537 11/03/22 1156 11/04/22 0351 11/05/22 0426  AST 15  --  12* 11*  --   ALT 13  --  12 11  --   ALKPHOS 103  --  85 74  --   BILITOT 1.0  --  0.2* 0.8  --   PROT 6.6  --  5.9* 5.3*  --   ALBUMIN 3.4* 2.9* 3.0* 2.7*  2.7* 2.7*   No results for input(s): "LIPASE", "AMYLASE" in the last 168 hours. No results for input(s): "AMMONIA" in the last 168 hours. Coagulation Profile: No results for input(s): "INR", "PROTIME" in the last 168 hours. CBC: Recent Labs  Lab 10/30/22 1900 10/31/22 0541 11/02/22 0537 11/02/22 1643 11/03/22 1156 11/04/22 0351  WBC 8.4 8.1 9.6  --  5.4 5.6  HGB 10.9* 10.1* 9.4* 10.3* 9.9* 9.2*  HCT 34.4* 30.8* 28.2* 30.3* 30.5* 28.2*  MCV 110.3* 104.4* 105.6*  --  108.9* 107.6*  PLT 188 191 151  --  180 183   Cardiac Enzymes: No results for input(s): "CKTOTAL", "CKMB", "CKMBINDEX", "TROPONINI" in the last 168 hours. BNP: Invalid input(s): "POCBNP" CBG: Recent Labs  Lab 10/30/22 1811 11/01/22 1704 11/02/22 2126 11/03/22 2020  GLUCAP 126* 67* 137* 200*   HbA1C: No results for input(s): "HGBA1C" in the last 72 hours. Urine analysis:    Component Value Date/Time   COLORURINE YELLOW 12/16/2012 1919   APPEARANCEUR CLOUDY (A) 12/16/2012 1919   LABSPEC 1.009 12/16/2012 1919   PHURINE 7.5 12/16/2012 1919   GLUCOSEU NEGATIVE 12/16/2012 1919   HGBUR TRACE (A) 12/16/2012 1919   BILIRUBINUR NEGATIVE 12/16/2012 1919   KETONESUR NEGATIVE 12/16/2012 1919   PROTEINUR 100 (A) 12/16/2012 1919   UROBILINOGEN 0.2 12/16/2012 1919   NITRITE NEGATIVE 12/16/2012 1919   LEUKOCYTESUR SMALL (A) 12/16/2012 1919   Recent Results (from the past 240 hour(s))  MRSA Next Gen by PCR, Nasal     Status: None   Collection Time: 10/30/22  6:52 PM   Specimen: Nasal Mucosa; Nasal Swab  Result Value Ref Range Status   MRSA by PCR Next Gen NOT DETECTED NOT DETECTED Final    Comment: (NOTE) The GeneXpert MRSA Assay (FDA  approved for NASAL specimens only), is one component of a comprehensive MRSA colonization surveillance program. It is not intended to diagnose MRSA infection nor to guide or monitor treatment for MRSA infections. Test performance is not FDA approved in patients less than 14 years old. Performed at [facility], [address] [place], [place] [address]   Culture, blood (Routine X  2) w Reflex to ID Panel     Status: None (Preliminary result)   Collection Time: 11/02/22  7:24 PM   Specimen: BLOOD  Result Value Ref Range Status   Specimen Description BLOOD BLOOD RIGHT HAND  Final   Special Requests   Final    BOTTLES DRAWN AEROBIC AND ANAEROBIC Blood Culture adequate volume   Culture   Final    NO GROWTH 4 DAYS Performed at [facility], [address] [place], [place] [address]    Report Status PENDING  Incomplete  Culture, blood (Routine X 2) w Reflex to ID Panel     Status: None (Preliminary result)   Collection Time: 11/02/22  7:32 PM   Specimen: BLOOD  Result Value Ref Range Status   Specimen Description BLOOD BLOOD RIGHT ARM  Final   Special Requests   Final    BOTTLES DRAWN AEROBIC AND ANAEROBIC Blood Culture adequate volume   Culture   Final    NO GROWTH 4 DAYS Performed at [facility], [address], [place], [place] [address]    Report Status PENDING  Incomplete     Scheduled Meds:  amiodarone  100 mg Oral Daily   calcitRIOL  1.75 mcg Oral Q M,W,F-HD   Chlorhexidine Gluconate Cloth  6 each Topical Daily   Chlorhexidine Gluconate Cloth  6 each Topical Q0600   Chlorhexidine Gluconate Cloth  6 each Topical Q0600   cinacalcet  30 mg Oral Q M,W,F-HD   levETIRAcetam  500 mg Oral Daily   levothyroxine  75 mcg Oral QAC breakfast   lisinopril  20 mg Oral Daily   magic mouthwash  5 mL Oral TID   metoprolol tartrate  12.5 mg Oral BID   octreotide  100 mcg Subcutaneous Q12H   polyethylene glycol  17 g Oral TID   rosuvastatin  10 mg Oral Daily   Continuous Infusions:   anticoagulant sodium citrate     piperacillin-tazobactam (ZOSYN)  IV 2.25 g (11/06/22 1226)    Procedures/Studies: DG Abd Portable 2V  Result Date: 11/05/2022 CLINICAL DATA:  Abdominal pain EXAM: PORTABLE ABDOMEN - 2 VIEW COMPARISON:  CT 11/02/2022 FINDINGS: No dilated loops of large or small bowel identified. Increasing stool burden identified within the colon. No air-fluid levels identified. Surgical clips noted within the pelvis. Left lung base opacity identified within the imaged portions of the lower chest. Patient has a left chest wall dialysis catheter with tips in the right atrium. Signs of previous CABG procedure. IMPRESSION: 1. Nonobstructive bowel gas pattern. 2. Increasing stool burden within the colon. 3. Left base opacity identified within the imaged portions of the chest. Correlate for any clinical signs/symptoms of pneumonia. Electronically Signed   By: Signa Kell M.D.   On: 11/05/2022 10:27   US Abdomen Limited RUQ (LIVER/GB)  Result Date: 11/03/2022 CLINICAL DATA:  Right upper quadrant pain EXAM: ULTRASOUND ABDOMEN LIMITED RIGHT UPPER QUADRANT COMPARISON:  CT 11/02/2022.  Older exams as well. FINDINGS: Gallbladder: Gallbladder is mildly distended. No shadowing stones or adjacent fluid but there is gallbladder wall thickening. Common bile duct: Diameter: 4 mm Liver: No focal lesion identified. Within normal limits in parenchymal echogenicity. Portal vein is patent on color Doppler imaging with normal direction of blood flow towards the liver. Other: None. IMPRESSION: No shadowing stones. The gallbladder wall is thickened which is nonspecific. No adjacent fluid. No reported sonographic Murphy sign. Please correlate with clinical symptomatology. If there is further concern, HIDA scan could be considered as clinically appropriate. No  biliary ductal dilatation Electronically Signed   By: Karen Kays M.D.   On: 11/03/2022 14:59   CT ABDOMEN PELVIS WO CONTRAST  Result Date:  11/02/2022 CLINICAL DATA:  Acute abdominal pain after endoscopy EXAM: CT ABDOMEN AND PELVIS WITHOUT CONTRAST TECHNIQUE: Multidetector CT imaging of the abdomen and pelvis was performed following the standard protocol without IV contrast. RADIATION DOSE REDUCTION: This exam was performed according to the departmental dose-optimization program which includes automated exposure control, adjustment of the mA and/or kV according to patient size and/or use of iterative reconstruction technique. COMPARISON:  CT 04/06/2020 without contrast FINDINGS: Lower chest: Small bilateral pleural effusions identified with some adjacent opacities. Atelectasis is favored over infiltrate recommend follow-up. The heart is enlarged. Status post median sternotomy. Coronary artery calcifications are seen. Hepatobiliary: On this non IV contrast exam, the liver has some small calcifications, possibly related to old granulomatous disease. However the gallbladder is distended with some slight wall thickening and stranding. There is also some luminal high density. Sludge or stones are possible. If there is concern further of acute cholecystitis an ultrasound may be of some benefit as the next step in the workup. Pancreas: Moderate atrophy of the pancreas.  No obvious mass. Spleen: Spleen is nonenlarged. Adrenals/Urinary Tract: The adrenal glands are grossly preserved. There is severe atrophy of the kidneys with several vascular calcifications and tiny cystic areas. The more complex hyperdense area along the upper pole of the right kidney is stable. Previously measuring 2.8 x 2.0 cm and today 2.5 by 1.5 cm. Contracted urinary bladder. Stomach/Bowel: There is some fluid and debris in the stomach. Moderate diffuse scattered colonic stool. Left-sided colonic diverticulosis. Surgical changes along the rectosigmoid colon. No obstruction. Surgical changes as well along loops of bowel in the right lower quadrant. The appendix is not well seen. There are  several loops of small bowel in the midabdomen which have air-fluid levels and are mildly dilated measuring up to 3.2 cm. No abrupt caliber change. Vascular/Lymphatic: Extensive vascular calcifications. Normal caliber aorta and IVC. No specific abnormal lymph node enlargement identified in the abdomen and pelvis. Reproductive: Status post hysterectomy. No adnexal masses. Other: Anasarca. No obstruction, free air or free fluid. Mild areas of mesenteric stranding and presacral space stranding. Musculoskeletal: Osteopenia. Scattered degenerative changes. Schmorl's node changes along lumbar spine and thoracic spine. IMPRESSION: Once again severely atrophic kidneys with calcifications and cystic areas including complex area along the upper pole of the right kidney which is stable in size. Recommend continued surveillance. Developing small bilateral pleural effusion with adjacent lung opacities. Atelectasis versus infiltrate. Recommend follow-up. Increasing anasarca mesenteric stranding. There are some mildly dilated loops of small bowel with some air-fluid levels but no abrupt transition. Ileus versus mild partial obstruction is in the differential recommend close follow-up. Colonic diverticulosis. Distended gallbladder with wall thickening and some stranding. In addition there is some high attenuation material in the gallbladder. Please correlate with any sludge or stones. Please correlate with any prior contrast administration. Overall recommend further workup with ultrasound to further delineate when able. Electronically Signed   By: Karen Kays M.D.   On: 11/02/2022 14:27   MR BRAIN WO CONTRAST  Result Date: 10/31/2022 CLINICAL DATA:  Altered mental status EXAM: MRI HEAD WITHOUT CONTRAST TECHNIQUE: Multiplanar, multiecho pulse sequences of the brain and surrounding structures were obtained without intravenous contrast. COMPARISON:  04/23/2022 MRI, correlation is also made with 10/30/2022 CT head FINDINGS: Brain:  No restricted diffusion to suggest acute or subacute infarct. No acute hemorrhage,  mass, or midline shift. No hydrocephalus. 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 Normal pituitary and craniocervical junction. Remote lacunar infarcts in the bilateral basal ganglia and left cerebellum. Punctate foci of hemosiderin deposition in the right cerebellum, right campus, and right frontal and parietal lobes, possibly sequela of prior microhemorrhage. No superficial siderosis. Confluent T2 hyperintense signal in the periventricular white matter, likely the sequela of moderate chronic small vessel ischemic disease. Vascular: Normal arterial flow voids. Skull and upper cervical spine: Normal marrow signal. Sinuses/Orbits: Mucosal thickening in the left sphenoid sinus. No acute finding in the orbits. Other: Trace fluid in the mastoid air cells. IMPRESSION: 1. No acute intracranial process. No evidence of acute or subacute infarct. 2. 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 Electronically Signed   By: Wiliam Ke M.D.   On: 10/31/2022 19:31   EEG adult  Result Date: 10/31/2022 Charlsie Quest, MD     10/31/2022  2:57 PM Patient Name: [name] MRN: [id number] Epilepsy Attending: Charlsie Quest Referring Physician/Provider: Rejeana Brock, MD Date:10/31/2022 Duration: 22.39 mins Patient history: 82yo F getting eeg to evaluate for seizure Level of alertness: Awake, asleep AEDs during EEG study: LEV Technical aspects: This EEG study was done with scalp electrodes positioned according to the 10-20 International system of electrode placement. Electrical activity was reviewed with band pass filter of 1-70Hz , sensitivity of 7 uV/mm, display speed of 64mm/sec with a 60Hz  notched filter applied as appropriate. EEG data  were recorded continuously and digitally stored.  Video monitoring was available and reviewed as appropriate. Description: The posterior dominant rhythm consists of 8-9 Hz activity of moderate voltage (25-35 uV) seen predominantly in posterior head regions, symmetric and reactive to eye opening and eye closing. Sleep was characterized by vertex waves, sleep spindles (12 to 14 Hz), maximal frontocentral region.  EEG showed intermittent generalized 3 to 6 Hz theta-delta slowing. Hyperventilation and photic stimulation were not performed.   ABNORMALITY - Intermittent slow, generalized IMPRESSION: This study is suggestive of mild diffuse encephalopathy. No seizures or epileptiform discharges were seen throughout the recording. Charlsie Quest   DG Abd 1 View  Result Date: 10/31/2022 CLINICAL DATA:  Abdominal pain EXAM: ABDOMEN - 1 VIEW COMPARISON:  08/29/2022 FINDINGS: Bowel gas pattern is nonspecific. Gas and stool are noted in colon. Small amount of stool is seen in colon. There are surgical staples and clips in lower abdomen and pelvis. There is no identifiable electronic device in abdomen and pelvis in the current study. In the previous examination, an electronic device, capsule most noted in region of rectum suggesting interval passage. Arterial calcifications are seen. IMPRESSION: Nonspecific bowel gas pattern. There is no evidence of any metallic electronic capsule in the abdomen and pelvis. Electronically Signed   By: Ernie Avena M.D.   On: 10/31/2022 08:40   CT HEAD CODE STROKE WO CONTRAST  Result Date: 10/30/2022 CLINICAL DATA:  Code stroke.  Seizure, altered mental status. EXAM: CT HEAD WITHOUT CONTRAST TECHNIQUE: Contiguous axial images were obtained from the base of the skull through the vertex without intravenous contrast. RADIATION DOSE REDUCTION: This exam was performed according to the departmental dose-optimization program which includes automated exposure control, adjustment of the mA  and/or kV according to patient size and/or use of  iterative reconstruction technique. COMPARISON:  CT head 08/26/2022. FINDINGS: Brain: There is no evidence of acute territorial infarct. There are low-density subdural collections overlying both cerebral hemispheres measuring up to 6 mm on the left and 3 mm on the right without evidence of acute blood products, new since the prior head CT from 08/26/2022. There is no mass effect on the underlying brain parenchyma. Parenchymal volume loss with prominence of the ventricular system and extra-axial CSF spaces is stable. The ventricles are unchanged in size and configuration. A remote infarct in the left basal ganglia is unchanged. Additional confluent hypodensity in the supratentorial white matter likely reflects sequela of underlying chronic small-vessel ischemic change. The pituitary and suprasellar region are normal. There is no mass lesion. There is no midline shift. Vascular: There is calcification of the bilateral carotid siphons and vertebral arteries. Skull: Normal. Negative for fracture or focal lesion. Sinuses/Orbits: The paranasal sinuses are clear. The globes and orbits are unremarkable. Other: None. ASPECTS [facility] Stroke Program Early CT Score) - Ganglionic level infarction (caudate, lentiform nuclei, internal capsule, insula, M1-M3 cortex): 7 - Supraganglionic infarction (M4-M6 cortex): 3 Total score (0-10 with 10 being normal): 10 IMPRESSION: 1. No evidence of acute intracranial hemorrhage or acute territorial infarct. 2. Low-density subdural hygromas overlying both cerebral hemispheres measuring up to 6 mm on the left and 3 mm on the right, new since the ct head from 08/26/2022 but without evidence of acute blood within the collections. These results communicated via Amion at the time of interpretation on 10/30/2022 at 6:44 pm to provider [facility] , who verbally acknowledged these results. Electronically Signed   By: Lesia Hausen M.D.   On:  10/30/2022 18:46   DG Chest Port 1 View  Result Date: 10/28/2022 CLINICAL DATA:  Cough EXAM: PORTABLE CHEST 1 VIEW COMPARISON:  10/23/2022 FINDINGS: Prior CABG. Left dialysis catheter remains in place with the tip in the right atrium. Mild cardiomegaly. No overt edema. Bibasilar atelectasis. No effusions or acute bony abnormality. IMPRESSION: Cardiomegaly, bibasilar atelectasis. Electronically Signed   By: Charlett Nose M.D.   On: 10/28/2022 22:15   CT Chest Wo Contrast  Result Date: 10/23/2022 CLINICAL DATA:  Pneumonia, complication suspected, xray done EXAM: CT CHEST WITHOUT CONTRAST TECHNIQUE: Multidetector CT imaging of the chest was performed following the standard protocol without IV contrast. RADIATION DOSE REDUCTION: This exam was performed according to the departmental dose-optimization program which includes automated exposure control, adjustment of the mA and/or kV according to patient size and/or use of iterative reconstruction technique. COMPARISON:  Radiograph earlier today. Additional prior radiographs reviewed. Most recent chest CT available 07/16/2021 FINDINGS: Cardiovascular: Left-sided dialysis catheter tip is in the atrial caval junction. Mild cardiomegaly. Post CABG with calcification of native coronary arteries. Decreased density of the blood pool typical of anemia. Advanced aortic atherosclerosis with aortic tortuosity. No aneurysm or periaortic stranding. No pericardial effusion. Mediastinum/Nodes: 14 mm lower anterior paratracheal node, similar to 2023 exam. Scattered additional shoddy mediastinal lymph nodes are also unchanged. No progressive adenopathy. Patulous esophagus. Lungs/Pleura: Small bilateral pleural effusions, left slightly larger than right. Slight vascular congestion. Minimal fluid in the fissures. Mild patchy ground-glass opacity in the dependent left greater than right lower lobe. Moderate central bronchial thickening which may be congestive or bronchitic. no  endobronchial debris. No pulmonary mass. Upper Abdomen: Sequela of chronic renal disease with renal atrophy and cysts. No specific imaging follow-up of these cysts is recommended. A peripherally calcified density in the right upper kidney is stable from 2023  exam and consistent with a complex cyst, also needing no further follow-up. Left colonic diverticulosis. Musculoskeletal: Exaggerated thoracic kyphosis with degenerative change throughout the thoracic spine. Chronic mild L1 compression deformity. Slight increase in Schmorl's node inferior endplate of T11. Prior median sternotomy. There are no acute or suspicious osseous abnormalities. IMPRESSION: 1. Small bilateral pleural effusions, left slightly larger than right. Slight vascular congestion. 2. Mild patchy ground-glass opacity in the dependent left greater than right lower lobe may be atelectasis or pneumonia. 3. Moderate central bronchial thickening may be congestive or bronchitic. Aortic Atherosclerosis (ICD10-I70.0). Electronically Signed   By: Narda Rutherford M.D.   On: 10/23/2022 15:35   DG Chest Port 1 View  Result Date: 10/23/2022 CLINICAL DATA:  82 year old female with cough. Productive cough for 2 weeks. Some shortness of breath. EXAM: PORTABLE CHEST 1 VIEW COMPARISON:  Chest radiographs 10/02/2022 and earlier. FINDINGS: Portable AP upright view at 1107 hours. Prior CABG. Stable cardiomegaly and mediastinal contours. Calcified aortic atherosclerosis. Stable left chest dual lumen dialysis type catheter. Patchy, nonspecific increased left lung base opacity partially obscuring the diaphragm now. No superimposed pneumothorax or consolidation. Stable pulmonary vascularity without overt edema. No confluent right lung opacity. Stable visualized osseous structures.  Negative visible bowel gas. IMPRESSION: 1. Patchy, nonspecific increased left lung base opacity new since last month. PA and lateral views may be helpful if feasible. 2. Otherwise stable  cardiomegaly, pulmonary vascularity, prior CABG, chest dialysis catheter, Aortic Atherosclerosis. Electronically Signed   By: Odessa Fleming M.D.   On: 10/23/2022 11:16    Standley Dakins, MD  [facility]  If 7PM-7AM, please contact night-coverage [email or web address] Password [facility] 11/06/2022, 4:46 PM   LOS: 8 days

## 2022-11-07 ENCOUNTER — Inpatient Hospital Stay (HOSPITAL_COMMUNITY): Payer: Medicare Other

## 2022-11-07 DIAGNOSIS — K559 Vascular disorder of intestine, unspecified: Secondary | ICD-10-CM

## 2022-11-07 DIAGNOSIS — K317 Polyp of stomach and duodenum: Secondary | ICD-10-CM

## 2022-11-07 DIAGNOSIS — D649 Anemia, unspecified: Secondary | ICD-10-CM | POA: Diagnosis not present

## 2022-11-07 DIAGNOSIS — K259 Gastric ulcer, unspecified as acute or chronic, without hemorrhage or perforation: Secondary | ICD-10-CM | POA: Diagnosis not present

## 2022-11-07 DIAGNOSIS — K5710 Diverticulosis of small intestine without perforation or abscess without bleeding: Secondary | ICD-10-CM | POA: Diagnosis not present

## 2022-11-07 DIAGNOSIS — I482 Chronic atrial fibrillation, unspecified: Secondary | ICD-10-CM | POA: Diagnosis not present

## 2022-11-07 DIAGNOSIS — K5900 Constipation, unspecified: Secondary | ICD-10-CM | POA: Diagnosis not present

## 2022-11-07 LAB — CULTURE, BLOOD (ROUTINE X 2)
Culture: NO GROWTH
Special Requests: ADEQUATE

## 2022-11-07 LAB — CBC
HCT: 27.9 % — ABNORMAL LOW (ref 36.0–46.0)
Hemoglobin: 9.6 g/dL — ABNORMAL LOW (ref 12.0–15.0)
MCH: 36.4 pg — ABNORMAL HIGH (ref 26.0–34.0)
MCHC: 34.4 g/dL (ref 30.0–36.0)
MCV: 105.7 fL — ABNORMAL HIGH (ref 80.0–100.0)
Platelets: 200 10*3/uL (ref 150–400)
RBC: 2.64 MIL/uL — ABNORMAL LOW (ref 3.87–5.11)
RDW: 25 % — ABNORMAL HIGH (ref 11.5–15.5)
WBC: 7.5 10*3/uL (ref 4.0–10.5)
nRBC: 0 % (ref 0.0–0.2)

## 2022-11-07 LAB — RENAL FUNCTION PANEL
Albumin: 3 g/dL — ABNORMAL LOW (ref 3.5–5.0)
Anion gap: 16 — ABNORMAL HIGH (ref 5–15)
BUN: 53 mg/dL — ABNORMAL HIGH (ref 8–23)
CO2: 21 mmol/L — ABNORMAL LOW (ref 22–32)
Calcium: 8 mg/dL — ABNORMAL LOW (ref 8.9–10.3)
Chloride: 94 mmol/L — ABNORMAL LOW (ref 98–111)
Creatinine, Ser: 7.96 mg/dL — ABNORMAL HIGH (ref 0.44–1.00)
GFR, Estimated: 5 mL/min — ABNORMAL LOW (ref >60)
Glucose, Bld: 161 mg/dL — ABNORMAL HIGH (ref 70–99)
Phosphorus: 7.7 mg/dL — ABNORMAL HIGH (ref 2.5–4.6)
Potassium: 4.7 mmol/L (ref 3.5–5.1)
Sodium: 131 mmol/L — ABNORMAL LOW (ref 135–145)

## 2022-11-07 MED ORDER — METOPROLOL TARTRATE 25 MG PO TABS
25.0000 mg | ORAL_TABLET | Freq: Once | ORAL | Status: AC
  Administered 2022-11-07: 25 mg via ORAL
  Filled 2022-11-07: qty 1

## 2022-11-07 MED ORDER — HEPARIN SODIUM (PORCINE) 1000 UNIT/ML IJ SOLN
INTRAMUSCULAR | Status: AC
  Filled 2022-11-07: qty 6

## 2022-11-07 MED ORDER — SEVELAMER CARBONATE 800 MG PO TABS
800.0000 mg | ORAL_TABLET | Freq: Three times a day (TID) | ORAL | Status: DC
  Administered 2022-11-07 – 2022-11-15 (×20): 800 mg via ORAL
  Filled 2022-11-07 (×21): qty 1

## 2022-11-07 NOTE — Progress Notes (Signed)
PROGRESS NOTE  Shawna [name] ZHY:[id number] DOB: [birth year] DOA: 10/28/2022 PCP: [facility]  Brief History:  82 year old female with a history of ESRD (MWF), coronary artery disease status post CABG, CHF, hypertension, hyperlipidemia, paroxysmal atrial fibrillation, hyperlipidemia, GI bleed presenting with generalized weakness and not feeling well after dialysis. At the time of admission, the patient was noted to have a hemoglobin of 7.1.  She was transfused 2 units PRBC.  GI was consulted to assist with management. Unfortunately, the patient had a rapid response called on the evening of 10/30/2022 secondary to altered mental status.  Apparently, the patient was noted to have seizure-like activity by nursing staff.  Apparently the patient was lethargic minimally responsive to painful stimuli.  There were shaking activity lasting 3 minutes noted by nursing staff.  Code stroke was activated.  CT of the brain was negative for hemorrhage.  There was some low-density subdural hygromas.  The patient was seen by teleneurology.  CTA of the head and neck was recommended, but family refused secondary to the patient's contrast allergy.  The patient was loaded with Keppra.  EEG was ordered.  They felt that the patient could stay at [facility] as long as she improves and follows commands.  She returns her to the ICU.  As the evening progressed, the patient was more responsive.  Apparently, the patient was refusing to follow commands but would speak when probed and speech was clear.  The patient did follow some simple commands.  The patient had recent hospitalization from 08/26/22 to 08/29/22 secondary to acute on chronic blood loss anemia.  Hemoccult was positive at that time.  The patmultiple gastric AVMs treated with APC, small hiatal hernia, no active or stigmata of bleeding.  Capsule was placed placed during EGD and showed few small AVMs in the small bowel, likely jejunum.  2 small polyps in  the distal small bowel previously noted. Capsule appears to sit at the ileocolonic anastomosis for the last 2 to 3 hours of study.  She was transfused 2 units PRBC during that hospital admission.  She is not felt to be a candidate for anticoagulation due to her recurrent GI bleeds.  The patient was noted to have hospital delirium and felt to have a degree of underlying cognitive impairment.  She also had a previous hospital admission from 08/06/2022 to 08/17/2022 due to acute on chronic encephalopathy during the hospitalization.  At best, it appears that the patient was oriented only to person at that time.  Family has noted that the patient has had confusion at home since wintertime 2023.  Once again, she had acute on chronic anemia.  She underwent EGD and colonoscopy on 08/10/2022.duodenal and colonic polyps which were removed. 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  She was seen by palliative medicine and full scope of care was continued.   Assessment/Plan: Acute on chronic blood loss anemia -GI consult appreciated -Transfused 1 unit PRBC this admission -Presented with hemoglobin 7.1 -Hemoglobin remained stable since transfusion -Blood loss felt to be secondary to the patient's gastric and small bowel AVMs -4/23 SB enteroscopy--nonbleeding gastric ulcer with clean base; non bleeding jejunal diverticula; no AVMs -she is treated with BID octreotide injections -Hgb remains largely stable around 9 , recheck CBC in a couple of days   Myoclonus/Seizure like activity -The patient has had similar episodes of what was described on the evening of 10/30/2022 in the past previously felt to be secondary to anemia and  gabapentin in the setting of ESRD patient -Appreciate neurology consult>>loaded keppra -Continue Keppra per neurologist - no further episodes on keppra   Generalized Abdominal Pain  -CT abd/pelvis - suggesting mild ileus and cholelithiasis  -[place] RUQ completed for further gallbladder work up - HIDA scan not  recommended by surgical team -tolerating diet, but endorses post-prandial pain - improved after enema given 4/27 -laxatives added for constipation / mild ileus  -will ask for surgery eval - see surgery consult and recs -question of and concern for underlying mesenteric angina, poor candidate for surgical intervention, discussed with surgeon, will ask IR to evaluate if she could be a candidate for palliative stenting, pt would need CTA study to further evaluate for mesenteric ischemia if pain returns and would likely need to be premedicated and coordinated with dialysis.  GI team planning for CTA on Wed 5/1 with premedication (given history of contrast allergy)  -treating constipation, MOM enema ordered 4/27 -GI team intensified bowel regimen   ESRD -Dialyzes Monday, Wednesday, Friday -last dialysis 4/29 -Appreciate nephrology follow-up   Paroxysmal Atrial fibrillation/SVT -Not a candidate for anticoagulation secondary to recurrent GI bleed -Continue amiodarone and metoprolol  -currently in sinus, but has intermittent episodes of SVT   Essential hypertension -Continue home medications-metoprolol and lisinopril -Well-documented that the patient has had uncontrolled hypertension improved with HD volume removal - gave extra dose of metoprolol 25 mg 4/29 after HD due to HTN and tachycardia    Coronary Artery Disease s/p CABG in 2013 with DES to D1 in 10/2016 with cath showing patent LIMA-LAD, SVG-RI, SVG-Mrg and SVG-RCA and repeat cath in 07/2018 showing patent grafts with occlusion of D1 at prior stent site and progression of PDA disease and medical management was recommended -continue imdur, ASA 81 mg, simavastatin   Hypothyroidism -Continue home dose levothyroxine     Cognitive impairment -Documented over the last several hospitalization the patient has had baseline cognitive impairment and hospital delirium -She is at high risk for recurrent delirium due to her metabolic derangement  and hospital stays -10/30/2022 CT brain negative -continue delirium precautions and frequent family visitation       Family Communication:     Consultants:  GI, neurology, renal, surgery   Code Status:  FULL    DVT Prophylaxis:  SCDs  DISPO:  DC held due to ongoing abd pain, requiring further work up with CTA      Procedures: As Listed in Progress Note Above   Antibiotics: None   Subjective: Pt again having 10/10 abd pain when trying to ambulate with mobility team     Objective: Vitals:   11/07/22 1437 11/07/22 1445 11/07/22 1500 11/07/22 1515  BP:  115/84 131/78 (!) 147/93  Pulse:      Resp: 20 20 17    Temp:      TempSrc:      SpO2:      Weight:      Height:        Intake/Output Summary (Last 24 hours) at 11/07/2022 1721 Last data filed at 11/07/2022 1430 Gross per 24 hour  Intake 240 ml  Output 2501 ml  Net -2261 ml   Weight change:  Exam:  General:  Pt appears chronically ill and intermittently confused, eating breakfast, no distress noted HEENT: supple neck, No icterus, No thrush, No neck mass, [place]/AT Cardiovascular: normal S1/S2, no rubs, no gallops Respiratory: CTA bilaterally, no wheezing, no crackles, no rhonchi Abdomen: Soft/+BS, generalized abd tenderness, non distended, no guarding Extremities: trace pretibial edema, No  lymphangitis, No petechiae, No rashes, no synovitis  Data Reviewed: I have personally reviewed following labs and imaging studies Basic Metabolic Panel: Recent Labs  Lab 11/02/22 0537 11/03/22 1156 11/04/22 0351 11/05/22 0426 11/07/22 0955  NA 131* 133* 132* 133* 131*  K 3.9 3.6 4.4 4.3 4.7  CL 98 97* 97* 97* 94*  CO2 21* 26 23 25  21*  GLUCOSE 103* 138* 74 83 161*  BUN 34* 24* 35* 25* 53*  CREATININE 5.97* 4.62* 5.67* 4.36* 7.96*  CALCIUM 7.2* 7.4* 7.3* 7.6* 8.0*  PHOS 5.3*  --  5.6* 4.9* 7.7*   Liver Function Tests: Recent Labs  Lab 11/02/22 0537 11/03/22 1156 11/04/22 0351 11/05/22 0426 11/07/22 0955  AST  --   12* 11*  --   --   ALT  --  12 11  --   --   ALKPHOS  --  85 74  --   --   BILITOT  --  0.2* 0.8  --   --   PROT  --  5.9* 5.3*  --   --   ALBUMIN 2.9* 3.0* 2.7*  2.7* 2.7* 3.0*   No results for input(s): "LIPASE", "AMYLASE" in the last 168 hours. No results for input(s): "AMMONIA" in the last 168 hours. Coagulation Profile: No results for input(s): "INR", "PROTIME" in the last 168 hours. CBC: Recent Labs  Lab 11/02/22 0537 11/02/22 1643 11/03/22 1156 11/04/22 0351 11/07/22 0955  WBC 9.6  --  5.4 5.6 7.5  HGB 9.4* 10.3* 9.9* 9.2* 9.6*  HCT 28.2* 30.3* 30.5* 28.2* 27.9*  MCV 105.6*  --  108.9* 107.6* 105.7*  PLT 151  --  180 183 200   Cardiac Enzymes: No results for input(s): "CKTOTAL", "CKMB", "CKMBINDEX", "TROPONINI" in the last 168 hours. BNP: Invalid input(s): "POCBNP" CBG: Recent Labs  Lab 11/01/22 1704 11/02/22 2126 11/03/22 2020  GLUCAP 67* 137* 200*   HbA1C: No results for input(s): "HGBA1C" in the last 72 hours. Urine analysis:    Component Value Date/Time   COLORURINE YELLOW 12/16/2012 1919   APPEARANCEUR CLOUDY (A) 12/16/2012 1919   LABSPEC 1.009 12/16/2012 1919   PHURINE 7.5 12/16/2012 1919   GLUCOSEU NEGATIVE 12/16/2012 1919   HGBUR TRACE (A) 12/16/2012 1919   BILIRUBINUR NEGATIVE 12/16/2012 1919   KETONESUR NEGATIVE 12/16/2012 1919   PROTEINUR 100 (A) 12/16/2012 1919   UROBILINOGEN 0.2 12/16/2012 1919   NITRITE NEGATIVE 12/16/2012 1919   LEUKOCYTESUR SMALL (A) 12/16/2012 1919   Recent Results (from the past 240 hour(s))  MRSA Next Gen by PCR, Nasal     Status: None   Collection Time: 10/30/22  6:52 PM   Specimen: Nasal Mucosa; Nasal Swab  Result Value Ref Range Status   MRSA by PCR Next Gen NOT DETECTED NOT DETECTED Final    Comment: (NOTE) The GeneXpert MRSA Assay (FDA approved for NASAL specimens only), is one component of a comprehensive MRSA colonization surveillance program. It is not intended to diagnose MRSA infection nor to  guide or monitor treatment for MRSA infections. Test performance is not FDA approved in patients less than 11 years old. Performed at [facility], [address], [place], [place] [address]   Culture, blood (Routine X 2) w Reflex to ID Panel     Status: None   Collection Time: 11/02/22  7:24 PM   Specimen: BLOOD  Result Value Ref Range Status   Specimen Description BLOOD BLOOD RIGHT HAND  Final   Special Requests   Final    BOTTLES  DRAWN AEROBIC AND ANAEROBIC Blood Culture adequate volume   Culture   Final    NO GROWTH 5 DAYS Performed at [facility], [address] [place], [place] [address]    Report Status 11/07/2022 FINAL  Final  Culture, blood (Routine X 2) w Reflex to ID Panel     Status: None   Collection Time: 11/02/22  7:32 PM   Specimen: BLOOD  Result Value Ref Range Status   Specimen Description BLOOD BLOOD RIGHT ARM  Final   Special Requests   Final    BOTTLES DRAWN AEROBIC AND ANAEROBIC Blood Culture adequate volume   Culture   Final    NO GROWTH 5 DAYS Performed at [facility], [address], [place], [place] [address]    Report Status 11/07/2022 FINAL  Final     Scheduled Meds:  amiodarone  100 mg Oral Daily   calcitRIOL  1.75 mcg Oral Q M,W,F-HD   Chlorhexidine Gluconate Cloth  6 each Topical Daily   Chlorhexidine Gluconate Cloth  6 each Topical Q0600   Chlorhexidine Gluconate Cloth  6 each Topical Q0600   cinacalcet  30 mg Oral Q M,W,F-HD   levETIRAcetam  500 mg Oral Daily   levothyroxine  75 mcg Oral QAC breakfast   lisinopril  20 mg Oral Daily   magic mouthwash  5 mL Oral TID   metoprolol tartrate  12.5 mg Oral BID   octreotide  100 mcg Subcutaneous Q12H   polyethylene glycol  17 g Oral TID   rosuvastatin  10 mg Oral Daily   sevelamer carbonate  800 mg Oral TID WC   Continuous Infusions:  anticoagulant sodium citrate     piperacillin-tazobactam (ZOSYN)  IV 2.25 g (11/07/22 1533)    Procedures/Studies: DG Abd Portable 2V  Result Date:  11/05/2022 CLINICAL DATA:  Abdominal pain EXAM: PORTABLE ABDOMEN - 2 VIEW COMPARISON:  CT 11/02/2022 FINDINGS: No dilated loops of large or small bowel identified. Increasing stool burden identified within the colon. No air-fluid levels identified. Surgical clips noted within the pelvis. Left lung base opacity identified within the imaged portions of the lower chest. Patient has a left chest wall dialysis catheter with tips in the right atrium. Signs of previous CABG procedure. IMPRESSION: 1. Nonobstructive bowel gas pattern. 2. Increasing stool burden within the colon. 3. Left base opacity identified within the imaged portions of the chest. Correlate for any clinical signs/symptoms of pneumonia. Electronically Signed   By: Signa Kell M.D.   On: 11/05/2022 10:27   US Abdomen Limited RUQ (LIVER/GB)  Result Date: 11/03/2022 CLINICAL DATA:  Right upper quadrant pain EXAM: ULTRASOUND ABDOMEN LIMITED RIGHT UPPER QUADRANT COMPARISON:  CT 11/02/2022.  Older exams as well. FINDINGS: Gallbladder: Gallbladder is mildly distended. No shadowing stones or adjacent fluid but there is gallbladder wall thickening. Common bile duct: Diameter: 4 mm Liver: No focal lesion identified. Within normal limits in parenchymal echogenicity. Portal vein is patent on color Doppler imaging with normal direction of blood flow towards the liver. Other: None. IMPRESSION: No shadowing stones. The gallbladder wall is thickened which is nonspecific. No adjacent fluid. No reported sonographic Murphy sign. Please correlate with clinical symptomatology. If there is further concern, HIDA scan could be considered as clinically appropriate. No biliary ductal dilatation Electronically Signed   By: Karen Kays M.D.   On: 11/03/2022 14:59   CT ABDOMEN PELVIS WO CONTRAST  Result Date: 11/02/2022 CLINICAL DATA:  Acute abdominal pain after endoscopy EXAM: CT ABDOMEN AND PELVIS WITHOUT CONTRAST  TECHNIQUE: Multidetector CT imaging of the abdomen and  pelvis was performed following the standard protocol without IV contrast. RADIATION DOSE REDUCTION: This exam was performed according to the departmental dose-optimization program which includes automated exposure control, adjustment of the mA and/or kV according to patient size and/or use of iterative reconstruction technique. COMPARISON:  CT 04/06/2020 without contrast FINDINGS: Lower chest: Small bilateral pleural effusions identified with some adjacent opacities. Atelectasis is favored over infiltrate recommend follow-up. The heart is enlarged. Status post median sternotomy. Coronary artery calcifications are seen. Hepatobiliary: On this non IV contrast exam, the liver has some small calcifications, possibly related to old granulomatous disease. However the gallbladder is distended with some slight wall thickening and stranding. There is also some luminal high density. Sludge or stones are possible. If there is concern further of acute cholecystitis an ultrasound may be of some benefit as the next step in the workup. Pancreas: Moderate atrophy of the pancreas.  No obvious mass. Spleen: Spleen is nonenlarged. Adrenals/Urinary Tract: The adrenal glands are grossly preserved. There is severe atrophy of the kidneys with several vascular calcifications and tiny cystic areas. The more complex hyperdense area along the upper pole of the right kidney is stable. Previously measuring 2.8 x 2.0 cm and today 2.5 by 1.5 cm. Contracted urinary bladder. Stomach/Bowel: There is some fluid and debris in the stomach. Moderate diffuse scattered colonic stool. Left-sided colonic diverticulosis. Surgical changes along the rectosigmoid colon. No obstruction. Surgical changes as well along loops of bowel in the right lower quadrant. The appendix is not well seen. There are several loops of small bowel in the midabdomen which have air-fluid levels and are mildly dilated measuring up to 3.2 cm. No abrupt caliber change.  Vascular/Lymphatic: Extensive vascular calcifications. Normal caliber aorta and IVC. No specific abnormal lymph node enlargement identified in the abdomen and pelvis. Reproductive: Status post hysterectomy. No adnexal masses. Other: Anasarca. No obstruction, free air or free fluid. Mild areas of mesenteric stranding and presacral space stranding. Musculoskeletal: Osteopenia. Scattered degenerative changes. Schmorl's node changes along lumbar spine and thoracic spine. IMPRESSION: Once again severely atrophic kidneys with calcifications and cystic areas including complex area along the upper pole of the right kidney which is stable in size. Recommend continued surveillance. Developing small bilateral pleural effusion with adjacent lung opacities. Atelectasis versus infiltrate. Recommend follow-up. Increasing anasarca mesenteric stranding. There are some mildly dilated loops of small bowel with some air-fluid levels but no abrupt transition. Ileus versus mild partial obstruction is in the differential recommend close follow-up. Colonic diverticulosis. Distended gallbladder with wall thickening and some stranding. In addition there is some high attenuation material in the gallbladder. Please correlate with any sludge or stones. Please correlate with any prior contrast administration. Overall recommend further workup with ultrasound to further delineate when able. Electronically Signed   By: Karen Kays M.D.   On: 11/02/2022 14:27   MR BRAIN WO CONTRAST  Result Date: 10/31/2022 CLINICAL DATA:  Altered mental status EXAM: MRI HEAD WITHOUT CONTRAST TECHNIQUE: Multiplanar, multiecho pulse sequences of the brain and surrounding structures were obtained without intravenous contrast. COMPARISON:  04/23/2022 MRI, correlation is also made with 10/30/2022 CT head FINDINGS: Brain: No restricted diffusion to suggest acute or subacute infarct. No acute hemorrhage, mass, or midline shift. No hydrocephalus. Redemonstrated left  greater than right subdural hygroma, which measures up to 4 mm on the left and 1-2 mm on the right, as seen on the prior CT but new from the prior MRI. Normal pituitary and  craniocervical junction. Remote lacunar infarcts in the bilateral basal ganglia and left cerebellum. Punctate foci of hemosiderin deposition in the right cerebellum, right campus, and right frontal and parietal lobes, possibly sequela of prior microhemorrhage. No superficial siderosis. Confluent T2 hyperintense signal in the periventricular white matter, likely the sequela of moderate chronic small vessel ischemic disease. Vascular: Normal arterial flow voids. Skull and upper cervical spine: Normal marrow signal. Sinuses/Orbits: Mucosal thickening in the left sphenoid sinus. No acute finding in the orbits. Other: Trace fluid in the mastoid air cells. IMPRESSION: 1. No acute intracranial process. No evidence of acute or subacute infarct. 2. 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 Electronically Signed   By: Wiliam Ke M.D.   On: 10/31/2022 19:31   EEG adult  Result Date: 10/31/2022 Charlsie Quest, MD     10/31/2022  2:57 PM Patient Name: Shawna [name] MRN: [id number] Epilepsy Attending: Charlsie Quest Referring Physician/Provider: Rejeana Brock, MD Date:10/31/2022 Duration: 22.39 mins Patient history: 82yo F getting eeg to evaluate for seizure Level of alertness: Awake, asleep AEDs during EEG study: LEV Technical aspects: This EEG study was done with scalp electrodes positioned according to the 10-20 International system of electrode placement. Electrical activity was reviewed with band pass filter of 1-70Hz , sensitivity of 7 uV/mm, display speed of 51mm/sec with a 60Hz  notched filter applied as appropriate. EEG data were recorded continuously and digitally stored.  Video monitoring was available and reviewed as appropriate. Description: The posterior dominant  rhythm consists of 8-9 Hz activity of moderate voltage (25-35 uV) seen predominantly in posterior head regions, symmetric and reactive to eye opening and eye closing. Sleep was characterized by vertex waves, sleep spindles (12 to 14 Hz), maximal frontocentral region.  EEG showed intermittent generalized 3 to 6 Hz theta-delta slowing. Hyperventilation and photic stimulation were not performed.   ABNORMALITY - Intermittent slow, generalized IMPRESSION: This study is suggestive of mild diffuse encephalopathy. No seizures or epileptiform discharges were seen throughout the recording. Charlsie Quest   DG Abd 1 View  Result Date: 10/31/2022 CLINICAL DATA:  Abdominal pain EXAM: ABDOMEN - 1 VIEW COMPARISON:  08/29/2022 FINDINGS: Bowel gas pattern is nonspecific. Gas and stool are noted in colon. Small amount of stool is seen in colon. There are surgical staples and clips in lower abdomen and pelvis. There is no identifiable electronic device in abdomen and pelvis in the current study. In the previous examination, an electronic device, capsule most noted in region of rectum suggesting interval passage. Arterial calcifications are seen. IMPRESSION: Nonspecific bowel gas pattern. There is no evidence of any metallic electronic capsule in the abdomen and pelvis. Electronically Signed   By: Ernie Avena M.D.   On: 10/31/2022 08:40   CT HEAD CODE STROKE WO CONTRAST  Result Date: 10/30/2022 CLINICAL DATA:  Code stroke.  Seizure, altered mental status. EXAM: CT HEAD WITHOUT CONTRAST TECHNIQUE: Contiguous axial images were obtained from the base of the skull through the vertex without intravenous contrast. RADIATION DOSE REDUCTION: This exam was performed according to the departmental dose-optimization program which includes automated exposure control, adjustment of the mA and/or kV according to patient size and/or use of iterative reconstruction technique. COMPARISON:  CT head 08/26/2022. FINDINGS: Brain: There is  no evidence of acute territorial infarct. There are low-density subdural collections overlying both cerebral hemispheres measuring up to 6 mm on the left and 3 mm on the right without evidence  of acute blood products, new since the prior head CT from 08/26/2022. There is no mass effect on the underlying brain parenchyma. Parenchymal volume loss with prominence of the ventricular system and extra-axial CSF spaces is stable. The ventricles are unchanged in size and configuration. A remote infarct in the left basal ganglia is unchanged. Additional confluent hypodensity in the supratentorial white matter likely reflects sequela of underlying chronic small-vessel ischemic change. The pituitary and suprasellar region are normal. There is no mass lesion. There is no midline shift. Vascular: There is calcification of the bilateral carotid siphons and vertebral arteries. Skull: Normal. Negative for fracture or focal lesion. Sinuses/Orbits: The paranasal sinuses are clear. The globes and orbits are unremarkable. Other: None. ASPECTS [facility] Stroke Program Early CT Score) - Ganglionic level infarction (caudate, lentiform nuclei, internal capsule, insula, M1-M3 cortex): 7 - Supraganglionic infarction (M4-M6 cortex): 3 Total score (0-10 with 10 being normal): 10 IMPRESSION: 1. No evidence of acute intracranial hemorrhage or acute territorial infarct. 2. Low-density subdural hygromas overlying both cerebral hemispheres measuring up to 6 mm on the left and 3 mm on the right, new since the ct head from 08/26/2022 but without evidence of acute blood within the collections. These results communicated via Amion at the time of interpretation on 10/30/2022 at 6:44 pm to provider [facility] , who verbally acknowledged these results. Electronically Signed   By: Lesia Hausen M.D.   On: 10/30/2022 18:46   DG Chest Port 1 View  Result Date: 10/28/2022 CLINICAL DATA:  Cough EXAM: PORTABLE CHEST 1 VIEW COMPARISON:  10/23/2022  FINDINGS: Prior CABG. Left dialysis catheter remains in place with the tip in the right atrium. Mild cardiomegaly. No overt edema. Bibasilar atelectasis. No effusions or acute bony abnormality. IMPRESSION: Cardiomegaly, bibasilar atelectasis. Electronically Signed   By: Charlett Nose M.D.   On: 10/28/2022 22:15   CT Chest Wo Contrast  Result Date: 10/23/2022 CLINICAL DATA:  Pneumonia, complication suspected, xray done EXAM: CT CHEST WITHOUT CONTRAST TECHNIQUE: Multidetector CT imaging of the chest was performed following the standard protocol without IV contrast. RADIATION DOSE REDUCTION: This exam was performed according to the departmental dose-optimization program which includes automated exposure control, adjustment of the mA and/or kV according to patient size and/or use of iterative reconstruction technique. COMPARISON:  Radiograph earlier today. Additional prior radiographs reviewed. Most recent chest CT available 07/16/2021 FINDINGS: Cardiovascular: Left-sided dialysis catheter tip is in the atrial caval junction. Mild cardiomegaly. Post CABG with calcification of native coronary arteries. Decreased density of the blood pool typical of anemia. Advanced aortic atherosclerosis with aortic tortuosity. No aneurysm or periaortic stranding. No pericardial effusion. Mediastinum/Nodes: 14 mm lower anterior paratracheal node, similar to 2023 exam. Scattered additional shoddy mediastinal lymph nodes are also unchanged. No progressive adenopathy. Patulous esophagus. Lungs/Pleura: Small bilateral pleural effusions, left slightly larger than right. Slight vascular congestion. Minimal fluid in the fissures. Mild patchy ground-glass opacity in the dependent left greater than right lower lobe. Moderate central bronchial thickening which may be congestive or bronchitic. no endobronchial debris. No pulmonary mass. Upper Abdomen: Sequela of chronic renal disease with renal atrophy and cysts. No specific imaging follow-up of  these cysts is recommended. A peripherally calcified density in the right upper kidney is stable from 2023 exam and consistent with a complex cyst, also needing no further follow-up. Left colonic diverticulosis. Musculoskeletal: Exaggerated thoracic kyphosis with degenerative change throughout the thoracic spine. Chronic mild L1 compression deformity. Slight increase in Schmorl's node inferior endplate of T11. Prior median sternotomy.  There are no acute or suspicious osseous abnormalities. IMPRESSION: 1. Small bilateral pleural effusions, left slightly larger than right. Slight vascular congestion. 2. Mild patchy ground-glass opacity in the dependent left greater than right lower lobe may be atelectasis or pneumonia. 3. Moderate central bronchial thickening may be congestive or bronchitic. Aortic Atherosclerosis (ICD10-I70.0). Electronically Signed   By: Narda Rutherford M.D.   On: 10/23/2022 15:35   DG Chest Port 1 View  Result Date: 10/23/2022 CLINICAL DATA:  82 year old female with cough. Productive cough for 2 weeks. Some shortness of breath. EXAM: PORTABLE CHEST 1 VIEW COMPARISON:  Chest radiographs 10/02/2022 and earlier. FINDINGS: Portable AP upright view at 1107 hours. Prior CABG. Stable cardiomegaly and mediastinal contours. Calcified aortic atherosclerosis. Stable left chest dual lumen dialysis type catheter. Patchy, nonspecific increased left lung base opacity partially obscuring the diaphragm now. No superimposed pneumothorax or consolidation. Stable pulmonary vascularity without overt edema. No confluent right lung opacity. Stable visualized osseous structures.  Negative visible bowel gas. IMPRESSION: 1. Patchy, nonspecific increased left lung base opacity new since last month. PA and lateral views may be helpful if feasible. 2. Otherwise stable cardiomegaly, pulmonary vascularity, prior CABG, chest dialysis catheter, Aortic Atherosclerosis. Electronically Signed   By: Odessa Fleming M.D.   On: 10/23/2022  11:16    Standley Dakins, MD  [facility]  If 7PM-7AM, please contact night-coverage [email or web address] Password [email or web address] 11/07/2022, 5:21 PM   LOS: 9 days

## 2022-11-07 NOTE — Progress Notes (Signed)
[facility] Progress Note   Assessment/ Plan:   ESRD MWF L IJ TDC [facility]: on scheudle., no heparin, Max UF 2L.  Next Hd today ABLA with likely UGI source, GI following. Transfused at presentation.  EGD/enteroscopy 4/23 wit GI-  was trending down and has not been checked in a few days-  will put in order for CBC with HD HTN/Vol:BPs improved with UF.  Still looks like a little volume to give. Anemia: as above CKD-BMD: Ca corrects to around 8, and P ok.  Continue calcitriol.  Cinacalcet is 3 times weekly.  If calcium cont to drop further discontinue cinacalcet.  Resume binders  AMS: Neurology s/o.  Waxing/waning.  Likely delirium.  Some concern for seizures. Abd pain: Ct looks like ileus ? Cholecystitis- blood cultures drawn, started Zosyn -  per GI and surgery-  there was some talk about mesenteric stenting but then also mention of discharge ?  Dispo: pending  Subjective:    Trying to make a phone call in room-  very pleasant-  at end of interaction does say her belly is bothering her -  due for dialysis today then there is mention of her going home   Objective:   BP (!) 157/61 (BP Location: Right Arm)   Pulse (!) 55   Temp 97.8 F (36.6 C) (Oral)   Resp 18   Ht 5\' 5"  (1.651 m)   Wt 56.6 kg   SpO2 98%   BMI 20.76 kg/m   Physical Exam: Gen:NAD, sitting in bed CVS: RRR Resp: muffled at bases Abd: soft Ext: trace LE edema, 1+ UE edema, R > L ACCESS: clotted AVF, L TDC + T/B  Labs: BMET Recent Labs  Lab 11/01/22 1055 11/02/22 0537 11/03/22 1156 11/04/22 0351 11/05/22 0426  NA 132* 131* 133* 132* 133*  K 3.9 3.9 3.6 4.4 4.3  CL 97* 98 97* 97* 97*  CO2 25 21* 26 23 25   GLUCOSE 87 103* 138* 74 83  BUN 30* 34* 24* 35* 25*  CREATININE 5.05* 5.97* 4.62* 5.67* 4.36*  CALCIUM 7.2* 7.2* 7.4* 7.3* 7.6*  PHOS  --  5.3*  --  5.6* 4.9*   CBC Recent Labs  Lab 11/02/22 0537 11/02/22 1643 11/03/22 1156 11/04/22 0351  WBC 9.6  --  5.4 5.6  HGB 9.4* 10.3*  9.9* 9.2*  HCT 28.2* 30.3* 30.5* 28.2*  MCV 105.6*  --  108.9* 107.6*  PLT 151  --  180 183      Medications:     amiodarone  100 mg Oral Daily   calcitRIOL  1.75 mcg Oral Q M,W,F-HD   Chlorhexidine Gluconate Cloth  6 each Topical Daily   Chlorhexidine Gluconate Cloth  6 each Topical Q0600   Chlorhexidine Gluconate Cloth  6 each Topical Q0600   cinacalcet  30 mg Oral Q M,W,F-HD   levETIRAcetam  500 mg Oral Daily   levothyroxine  75 mcg Oral QAC breakfast   lisinopril  20 mg Oral Daily   magic mouthwash  5 mL Oral TID   metoprolol tartrate  12.5 mg Oral BID   octreotide  100 mcg Subcutaneous Q12H   polyethylene glycol  17 g Oral TID   rosuvastatin  10 mg Oral Daily     Demontae Antunes A Dineen Conradt  11/07/2022, 8:01 AM

## 2022-11-07 NOTE — Care Management Important Message (Signed)
Important Message  Patient Details  Name: Shawna [name] MRN: [id number] Date of Birth: 08-20-1940   Medicare Important Message Given:  Yes     [name] 11/07/2022, 11:19 AM

## 2022-11-07 NOTE — Progress Notes (Signed)
Mobility Specialist Progress Note:    11/07/22 1000  Mobility  Activity Dangled on edge of bed;Transferred from chair to bed  Level of Assistance Minimal assist, patient does 75% or more  Assistive Device None  Activity Response Tolerated well  Mobility Referral Yes  $Mobility charge 1 Mobility   Pt agreeable to mobility session, received sitting EOB. Pt not wearing O2, SpO2 96% on RA. Attempted to stand at bedside, pt reports 10/10 abdominal pain, deferred further mobility. Assisted pt back into bed, all needs met, NP in room.   Feliciana Rossetti Mobility Specialist Please contact via Special educational needs teacher or  Rehab office at [phone]

## 2022-11-07 NOTE — TOC Progression Note (Signed)
Transition of Care [facility]) - Progression Note    Patient Details  Name: Shawna [name] MRN: [id number] Date of Birth: 1941/05/05  Transition of Care [facility]) CM/SW Contact  Karn Cassis, [place] Phone Number: 11/07/2022, 3:28 PM  Clinical Narrative: LCSW faxed home health orders to [facility]. Sarah requests d/c summary as well when completed. Per MD, not ready for d/c today. TOC will continue to follow.       Expected Discharge Plan: Home/Self Care Barriers to Discharge: Continued Medical Work up  Expected Discharge Plan and Services In-house Referral: Clinical Social Work     Living arrangements for the past 2 months: Single Family Home                                       Social Determinants of Health (SDOH) Interventions SDOH Screenings   Food Insecurity: No Food Insecurity (10/29/2022)  Housing: Low Risk  (10/29/2022)  Transportation Needs: No Transportation Needs (10/29/2022)  Utilities: Not At Risk (10/29/2022)  Financial Resource Strain: Low Risk  (12/14/2018)  Physical Activity: Unknown (12/14/2018)  Social Connections: Unknown (12/14/2018)  Stress: No Stress Concern Present (12/14/2018)  Tobacco Use: Low Risk  (11/04/2022)    Readmission Risk Interventions    10/31/2022   12:45 PM 08/29/2022    4:47 PM 11/12/2021    8:56 AM  Readmission Risk Prevention Plan  Transportation Screening Complete Complete Complete  Medication Review Oceanographer) Complete Complete Complete  PCP or Specialist appointment within 3-5 days of discharge  Complete Complete  HRI or Home Care Consult Complete Complete Complete  SW Recovery Care/Counseling Consult Complete Complete Complete  Palliative Care Screening Not Applicable Not Applicable Not Applicable  Skilled Nursing Facility Not Applicable Complete Complete

## 2022-11-07 NOTE — Progress Notes (Addendum)
Gastroenterology Progress Note   Primary Care Physician:  [facility] Primary Gastroenterologist:  Dr. Levon Hedger   Patient ID: Shawna [name]; [id number]; 1940-09-28    Subjective   Difficult historian. Initially states abdominal pain improved with bowel movements, then reports pain 10/10.  No N/V.    Objective   Vital signs in last 24 hours Temp:  [97.5 F (36.4 C)-97.8 F (36.6 C)] 97.8 F (36.6 C) (04/29 0424) Pulse Rate:  [55-67] 55 (04/29 0424) Resp:  [18-20] 18 (04/29 0424) BP: (149-197)/(48-78) 157/61 (04/29 0424) SpO2:  [98 %-100 %] 98 % (04/29 0424) Last BM Date : 11/05/22  Physical Exam General:   Alert and oriented, pleasant Head:  Normocephalic and atraumatic. Abdomen:  Bowel sounds present, round, tender to palpation diffusely without guarding or rebound Msk:  Symmetrical without gross deformities. Normal posture. Pulses:  Normal pulses noted. Extremities:  Without  edema. Neurologic:  Alert and  oriented x4   Intake/Output from previous day: 04/28 0701 - 04/29 0700 In: 340 [P.O.:340] Out: 1 [Stool:1] Intake/Output this shift: No intake/output data recorded.  Lab Results  Lab Results  Component Value Date   WBC 7.5 11/07/2022   HGB 9.6 (L) 11/07/2022   HCT 27.9 (L) 11/07/2022   MCV 105.7 (H) 11/07/2022   PLT 200 11/07/2022    BMET Recent Labs    11/05/22 0426  NA 133*  K 4.3  CL 97*  CO2 25  GLUCOSE 83  BUN 25*  CREATININE 4.36*  CALCIUM 7.6*   LFT Recent Labs    11/05/22 0426  ALBUMIN 2.7*     Studies/Results DG Abd Portable 2V  Result Date: 11/05/2022 CLINICAL DATA:  Abdominal pain EXAM: PORTABLE ABDOMEN - 2 VIEW COMPARISON:  CT 11/02/2022 FINDINGS: No dilated loops of large or small bowel identified. Increasing stool burden identified within the colon. No air-fluid levels identified. Surgical clips noted within the pelvis. Left lung base opacity identified within the imaged portions of the lower chest.  Patient has a left chest wall dialysis catheter with tips in the right atrium. Signs of previous CABG procedure. IMPRESSION: 1. Nonobstructive bowel gas pattern. 2. Increasing stool burden within the colon. 3. Left base opacity identified within the imaged portions of the chest. Correlate for any clinical signs/symptoms of pneumonia. Electronically Signed   By: Signa Kell M.D.   On: 11/05/2022 10:27   US Abdomen Limited RUQ (LIVER/GB)  Result Date: 11/03/2022 CLINICAL DATA:  Right upper quadrant pain EXAM: ULTRASOUND ABDOMEN LIMITED RIGHT UPPER QUADRANT COMPARISON:  CT 11/02/2022.  Older exams as well. FINDINGS: Gallbladder: Gallbladder is mildly distended. No shadowing stones or adjacent fluid but there is gallbladder wall thickening. Common bile duct: Diameter: 4 mm Liver: No focal lesion identified. Within normal limits in parenchymal echogenicity. Portal vein is patent on color Doppler imaging with normal direction of blood flow towards the liver. Other: None. IMPRESSION: No shadowing stones. The gallbladder wall is thickened which is nonspecific. No adjacent fluid. No reported sonographic Murphy sign. Please correlate with clinical symptomatology. If there is further concern, HIDA scan could be considered as clinically appropriate. No biliary ductal dilatation Electronically Signed   By: Karen Kays M.D.   On: 11/03/2022 14:59   CT ABDOMEN PELVIS WO CONTRAST  Result Date: 11/02/2022 CLINICAL DATA:  Acute abdominal pain after endoscopy EXAM: CT ABDOMEN AND PELVIS WITHOUT CONTRAST TECHNIQUE: Multidetector CT imaging of the abdomen and pelvis was performed following the standard protocol without IV contrast. RADIATION DOSE REDUCTION: This exam  was performed according to the departmental dose-optimization program which includes automated exposure control, adjustment of the mA and/or kV according to patient size and/or use of iterative reconstruction technique. COMPARISON:  CT 04/06/2020 without  contrast FINDINGS: Lower chest: Small bilateral pleural effusions identified with some adjacent opacities. Atelectasis is favored over infiltrate recommend follow-up. The heart is enlarged. Status post median sternotomy. Coronary artery calcifications are seen. Hepatobiliary: On this non IV contrast exam, the liver has some small calcifications, possibly related to old granulomatous disease. However the gallbladder is distended with some slight wall thickening and stranding. There is also some luminal high density. Sludge or stones are possible. If there is concern further of acute cholecystitis an ultrasound may be of some benefit as the next step in the workup. Pancreas: Moderate atrophy of the pancreas.  No obvious mass. Spleen: Spleen is nonenlarged. Adrenals/Urinary Tract: The adrenal glands are grossly preserved. There is severe atrophy of the kidneys with several vascular calcifications and tiny cystic areas. The more complex hyperdense area along the upper pole of the right kidney is stable. Previously measuring 2.8 x 2.0 cm and today 2.5 by 1.5 cm. Contracted urinary bladder. Stomach/Bowel: There is some fluid and debris in the stomach. Moderate diffuse scattered colonic stool. Left-sided colonic diverticulosis. Surgical changes along the rectosigmoid colon. No obstruction. Surgical changes as well along loops of bowel in the right lower quadrant. The appendix is not well seen. There are several loops of small bowel in the midabdomen which have air-fluid levels and are mildly dilated measuring up to 3.2 cm. No abrupt caliber change. Vascular/Lymphatic: Extensive vascular calcifications. Normal caliber aorta and IVC. No specific abnormal lymph node enlargement identified in the abdomen and pelvis. Reproductive: Status post hysterectomy. No adnexal masses. Other: Anasarca. No obstruction, free air or free fluid. Mild areas of mesenteric stranding and presacral space stranding. Musculoskeletal: Osteopenia.  Scattered degenerative changes. Schmorl's node changes along lumbar spine and thoracic spine. IMPRESSION: Once again severely atrophic kidneys with calcifications and cystic areas including complex area along the upper pole of the right kidney which is stable in size. Recommend continued surveillance. Developing small bilateral pleural effusion with adjacent lung opacities. Atelectasis versus infiltrate. Recommend follow-up. Increasing anasarca mesenteric stranding. There are some mildly dilated loops of small bowel with some air-fluid levels but no abrupt transition. Ileus versus mild partial obstruction is in the differential recommend close follow-up. Colonic diverticulosis. Distended gallbladder with wall thickening and some stranding. In addition there is some high attenuation material in the gallbladder. Please correlate with any sludge or stones. Please correlate with any prior contrast administration. Overall recommend further workup with ultrasound to further delineate when able. Electronically Signed   By: Karen Kays M.D.   On: 11/02/2022 14:27   MR BRAIN WO CONTRAST  Result Date: 10/31/2022 CLINICAL DATA:  Altered mental status EXAM: MRI HEAD WITHOUT CONTRAST TECHNIQUE: Multiplanar, multiecho pulse sequences of the brain and surrounding structures were obtained without intravenous contrast. COMPARISON:  04/23/2022 MRI, correlation is also made with 10/30/2022 CT head FINDINGS: Brain: No restricted diffusion to suggest acute or subacute infarct. No acute hemorrhage, mass, or midline shift. No hydrocephalus. 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 Normal pituitary and craniocervical junction. Remote lacunar infarcts in the bilateral basal ganglia and left cerebellum. Punctate foci of hemosiderin deposition in the right cerebellum, right campus,  and right frontal and  parietal lobes, possibly sequela of prior microhemorrhage. No superficial siderosis. Confluent T2 hyperintense signal in the periventricular white matter, likely the sequela of moderate chronic small vessel ischemic disease. Vascular: Normal arterial flow voids. Skull and upper cervical spine: Normal marrow signal. Sinuses/Orbits: Mucosal thickening in the left sphenoid sinus. No acute finding in the orbits. Other: Trace fluid in the mastoid air cells. IMPRESSION: 1. No acute intracranial process. No evidence of acute or subacute infarct. 2. 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 Electronically Signed   By: Wiliam Ke M.D.   On: 10/31/2022 19:31   EEG adult  Result Date: 10/31/2022 Charlsie Quest, MD     10/31/2022  2:57 PM Patient Name: Shawna [name] MRN: [id number] Epilepsy Attending: Charlsie Quest Referring Physician/Provider: Rejeana Brock, MD Date:10/31/2022 Duration: 22.39 mins Patient history: 82yo F getting eeg to evaluate for seizure Level of alertness: Awake, asleep AEDs during EEG study: LEV Technical aspects: This EEG study was done with scalp electrodes positioned according to the 10-20 International system of electrode placement. Electrical activity was reviewed with band pass filter of 1-70Hz , sensitivity of 7 uV/mm, display speed of 47mm/sec with a 60Hz  notched filter applied as appropriate. EEG data were recorded continuously and digitally stored.  Video monitoring was available and reviewed as appropriate. Description: The posterior dominant rhythm consists of 8-9 Hz activity of moderate voltage (25-35 uV) seen predominantly in posterior head regions, symmetric and reactive to eye opening and eye closing. Sleep was characterized by vertex waves, sleep spindles (12 to 14 Hz), maximal frontocentral region.  EEG showed intermittent generalized 3 to 6 Hz theta-delta slowing. Hyperventilation and photic stimulation  were not performed.   ABNORMALITY - Intermittent slow, generalized IMPRESSION: This study is suggestive of mild diffuse encephalopathy. No seizures or epileptiform discharges were seen throughout the recording. Charlsie Quest   DG Abd 1 View  Result Date: 10/31/2022 CLINICAL DATA:  Abdominal pain EXAM: ABDOMEN - 1 VIEW COMPARISON:  08/29/2022 FINDINGS: Bowel gas pattern is nonspecific. Gas and stool are noted in colon. Small amount of stool is seen in colon. There are surgical staples and clips in lower abdomen and pelvis. There is no identifiable electronic device in abdomen and pelvis in the current study. In the previous examination, an electronic device, capsule most noted in region of rectum suggesting interval passage. Arterial calcifications are seen. IMPRESSION: Nonspecific bowel gas pattern. There is no evidence of any metallic electronic capsule in the abdomen and pelvis. Electronically Signed   By: Ernie Avena M.D.   On: 10/31/2022 08:40   CT HEAD CODE STROKE WO CONTRAST  Result Date: 10/30/2022 CLINICAL DATA:  Code stroke.  Seizure, altered mental status. EXAM: CT HEAD WITHOUT CONTRAST TECHNIQUE: Contiguous axial images were obtained from the base of the skull through the vertex without intravenous contrast. RADIATION DOSE REDUCTION: This exam was performed according to the departmental dose-optimization program which includes automated exposure control, adjustment of the mA and/or kV according to patient size and/or use of iterative reconstruction technique. COMPARISON:  CT head 08/26/2022. FINDINGS: Brain: There is no evidence of acute territorial infarct. There are low-density subdural collections overlying both cerebral hemispheres measuring up to 6 mm on the left and 3 mm on the right without evidence of acute blood products, new since the prior head CT from 08/26/2022. There is no mass effect on the underlying brain parenchyma. Parenchymal volume  loss with prominence of the  ventricular system and extra-axial CSF spaces is stable. The ventricles are unchanged in size and configuration. A remote infarct in the left basal ganglia is unchanged. Additional confluent hypodensity in the supratentorial white matter likely reflects sequela of underlying chronic small-vessel ischemic change. The pituitary and suprasellar region are normal. There is no mass lesion. There is no midline shift. Vascular: There is calcification of the bilateral carotid siphons and vertebral arteries. Skull: Normal. Negative for fracture or focal lesion. Sinuses/Orbits: The paranasal sinuses are clear. The globes and orbits are unremarkable. Other: None. ASPECTS [facility] Stroke Program Early CT Score) - Ganglionic level infarction (caudate, lentiform nuclei, internal capsule, insula, M1-M3 cortex): 7 - Supraganglionic infarction (M4-M6 cortex): 3 Total score (0-10 with 10 being normal): 10 IMPRESSION: 1. No evidence of acute intracranial hemorrhage or acute territorial infarct. 2. Low-density subdural hygromas overlying both cerebral hemispheres measuring up to 6 mm on the left and 3 mm on the right, new since the ct head from 08/26/2022 but without evidence of acute blood within the collections. These results communicated via Amion at the time of interpretation on 10/30/2022 at 6:44 pm to provider [facility] , who verbally acknowledged these results. Electronically Signed   By: Lesia Hausen M.D.   On: 10/30/2022 18:46   DG Chest Port 1 View  Result Date: 10/28/2022 CLINICAL DATA:  Cough EXAM: PORTABLE CHEST 1 VIEW COMPARISON:  10/23/2022 FINDINGS: Prior CABG. Left dialysis catheter remains in place with the tip in the right atrium. Mild cardiomegaly. No overt edema. Bibasilar atelectasis. No effusions or acute bony abnormality. IMPRESSION: Cardiomegaly, bibasilar atelectasis. Electronically Signed   By: Charlett Nose M.D.   On: 10/28/2022 22:15   CT Chest Wo Contrast  Result Date: 10/23/2022 CLINICAL DATA:   Pneumonia, complication suspected, xray done EXAM: CT CHEST WITHOUT CONTRAST TECHNIQUE: Multidetector CT imaging of the chest was performed following the standard protocol without IV contrast. RADIATION DOSE REDUCTION: This exam was performed according to the departmental dose-optimization program which includes automated exposure control, adjustment of the mA and/or kV according to patient size and/or use of iterative reconstruction technique. COMPARISON:  Radiograph earlier today. Additional prior radiographs reviewed. Most recent chest CT available 07/16/2021 FINDINGS: Cardiovascular: Left-sided dialysis catheter tip is in the atrial caval junction. Mild cardiomegaly. Post CABG with calcification of native coronary arteries. Decreased density of the blood pool typical of anemia. Advanced aortic atherosclerosis with aortic tortuosity. No aneurysm or periaortic stranding. No pericardial effusion. Mediastinum/Nodes: 14 mm lower anterior paratracheal node, similar to 2023 exam. Scattered additional shoddy mediastinal lymph nodes are also unchanged. No progressive adenopathy. Patulous esophagus. Lungs/Pleura: Small bilateral pleural effusions, left slightly larger than right. Slight vascular congestion. Minimal fluid in the fissures. Mild patchy ground-glass opacity in the dependent left greater than right lower lobe. Moderate central bronchial thickening which may be congestive or bronchitic. no endobronchial debris. No pulmonary mass. Upper Abdomen: Sequela of chronic renal disease with renal atrophy and cysts. No specific imaging follow-up of these cysts is recommended. A peripherally calcified density in the right upper kidney is stable from 2023 exam and consistent with a complex cyst, also needing no further follow-up. Left colonic diverticulosis. Musculoskeletal: Exaggerated thoracic kyphosis with degenerative change throughout the thoracic spine. Chronic mild L1 compression deformity. Slight increase in  Schmorl's node inferior endplate of T11. Prior median sternotomy. There are no acute or suspicious osseous abnormalities. IMPRESSION: 1. Small bilateral pleural effusions, left slightly larger than right. Slight vascular congestion. 2.  Mild patchy ground-glass opacity in the dependent left greater than right lower lobe may be atelectasis or pneumonia. 3. Moderate central bronchial thickening may be congestive or bronchitic. Aortic Atherosclerosis (ICD10-I70.0). Electronically Signed   By: Narda Rutherford M.D.   On: 10/23/2022 15:35   DG Chest Port 1 View  Result Date: 10/23/2022 CLINICAL DATA:  82 year old female with cough. Productive cough for 2 weeks. Some shortness of breath. EXAM: PORTABLE CHEST 1 VIEW COMPARISON:  Chest radiographs 10/02/2022 and earlier. FINDINGS: Portable AP upright view at 1107 hours. Prior CABG. Stable cardiomegaly and mediastinal contours. Calcified aortic atherosclerosis. Stable left chest dual lumen dialysis type catheter. Patchy, nonspecific increased left lung base opacity partially obscuring the diaphragm now. No superimposed pneumothorax or consolidation. Stable pulmonary vascularity without overt edema. No confluent right lung opacity. Stable visualized osseous structures.  Negative visible bowel gas. IMPRESSION: 1. Patchy, nonspecific increased left lung base opacity new since last month. PA and lateral views may be helpful if feasible. 2. Otherwise stable cardiomegaly, pulmonary vascularity, prior CABG, chest dialysis catheter, Aortic Atherosclerosis. Electronically Signed   By: Odessa Fleming M.D.   On: 10/23/2022 11:16    Assessment  82 y.o. female with a history of dementia, ESRD on HD, A-fib on ASA, CAD, CHF, small bowel AVMs, GERD, HTN, colon cancer s/p resection in 1992, duodenal adenoma who presented to the hospital with weakness and fatigue following dialysis.   Hgb 7.1 on admission, receiving 1 unit PRBCs. Hgb stable at 9.6 this morning and remaining stable for past  72 hours. EGD/enteroscopy this admission with non-bleeding gastric ulcer s/p biopsy, jejunal polyp s/p biopsy, non-bleeding jejunal diverticula. Known AVMs on capsule in the past. Clinically without overt GI bleeding and stabilized from this standpoint.  She remains with abdominal pain despite more aggressive bowel regimen. CT on 4/24 with possible ileus, distended gallbladder. [place] with non-specific gallbladder wall thickening. No gallstones. Gen Surgery has seen patient and felt likely not dealing with biliary colic. There is concern for chronic mesenteric ischemia. She remains without an acute abdomen. As per plan, will arrange CTA to be done Wednesday prior to next dialysis session. She will need to be pre-medicated with prednisone and Benadryl as outlined below. Discussed plan with Dr. [name]. Abdominal xray today to assess stool burden.      Plan / Recommendations  Abdominal xray to assess stool burden Continue Miralax TID Plans for CTA on Wednesday prior to dialysis. Will need to have prednisone 50 mg at 13 hours prior, 7 hours prior, and 1 hour prior to scan. Will need Benadryl 50 mg one hour prior to scan. Coordinating with radiology.  Continue octreotide injections    LOS: 9 days    11/07/2022, 9:33 AM  Gelene Mink, PhD, ANP-BC [facility] Gastroenterology

## 2022-11-07 NOTE — Progress Notes (Signed)
6 Days Post-Op  Subjective: Patient still with intermittent abdominal pain.  It is crampy and intermittent in nature.  No nausea or vomiting noted.  Patient is having bowel movements.  Objective: Vital signs in last 24 hours: Temp:  [97.5 F (36.4 C)-97.9 F (36.6 C)] 97.9 F (36.6 C) (04/29 1015) Pulse Rate:  [55-67] 60 (04/29 1015) Resp:  [18-21] 18 (04/29 1200) BP: (149-215)/(48-94) 208/94 (04/29 1200) SpO2:  [96 %-100 %] 96 % (04/29 1015) Last BM Date : 11/05/22  Intake/Output from previous day: 04/28 0701 - 04/29 0700 In: 340 [P.O.:340] Out: 1 [Stool:1] Intake/Output this shift: Total I/O In: 240 [P.O.:240] Out: -   General appearance: alert and cooperative GI: Soft with intermittent tenderness throughout the abdomen but no rigidity is noted.  Lab Results:  Recent Labs    11/07/22 0955  WBC 7.5  HGB 9.6*  HCT 27.9*  PLT 200   BMET Recent Labs    11/05/22 0426 11/07/22 0955  NA 133* 131*  K 4.3 4.7  CL 97* 94*  CO2 25 21*  GLUCOSE 83 161*  BUN 25* 53*  CREATININE 4.36* 7.96*  CALCIUM 7.6* 8.0*   PT/INR No results for input(s): "LABPROT", "INR" in the last 72 hours.  Studies/Results: No results found.  Anti-infectives: Anti-infectives (From admission, onward)    Start     Dose/Rate Route Frequency Ordered Stop   11/03/22 1400  piperacillin-tazobactam (ZOSYN) 2.25 g in sodium chloride 0.9 % 50 mL IVPB        2.25 g 100 mL/hr over 30 Minutes Intravenous Every 8 hours 11/03/22 1025     11/02/22 2200  piperacillin-tazobactam (ZOSYN) IVPB 2.25 g  Status:  Discontinued        2.25 g 100 mL/hr over 30 Minutes Intravenous Every 8 hours 11/02/22 1826 11/03/22 1026       Assessment/Plan: s/p Procedure(s): ENTEROSCOPY BIOPSY SUBMUCOSAL TATTOO INJECTION Impression: Still with intermittent abdominal pain which seems recurrent in nature.  Agree with CTA of abdomen after pretreatment.  Nothing further to add from the surgical standpoint.  LOS: 9 days     Franky Macho 11/07/2022

## 2022-11-07 NOTE — Progress Notes (Signed)
   HEMODIALYSIS TREATMENT NOTE:  3.5 hour heparin-free treatment completed using left IJ TDC.  Catheter exit site is unremarkable.  Goal met: 2.5 liters removed without interruption in UF.  All blood was returned.  At end of session pt had a dry coughing spell which triggered tachycardia 120.  She remained in [facility] for another 30 minutes for monitoring. Other than c/o headache, she is asymptomatic.  Dr. [name] ordered additional dose of metoprolol 25 mg.  Post-dialysis:  11/07/22 1430  Vitals  Temp 97.7 F (36.5 C)  Temp Source Oral  BP (!) 141/76  MAP (mmHg) 92  BP Location Right Arm  BP Method Automatic  Patient Position (if appropriate) Lying  Pulse Rate (!) 118  Pulse Rate Source Monitor  ECG Heart Rate (!) 120  Resp 14  Oxygen Therapy  SpO2 98 %  O2 Device Room Air  Post Treatment  Dialyzer Clearance Heavily streaked  Duration of HD Treatment -hour(s) 3.5 hour(s)  Hemodialysis Intake (mL) 0 mL  Liters Processed 83.6  Fluid Removed (mL) 2500 mL  Tolerated HD Treatment Yes  Post-Hemodialysis Comments Goal met  Hemodialysis Catheter Left Subclavian Double lumen Temporary (Non-Tunneled)  Placement Date: 06/09/22   Placed prior to admission: Yes  Orientation: Left  Access Location: Subclavian  Hemodialysis Catheter Type: Double lumen Temporary (Non-Tunneled)  Site Condition No complications  Blue Lumen Status Flushed;Heparin locked;Dead end cap in place  Red Lumen Status Flushed;Heparin locked;Dead end cap in place  Purple Lumen Status N/A  Catheter fill solution Heparin 1000 units/ml  Catheter fill volume (Arterial) 1.9 cc  Catheter fill volume (Venous) 1.9  Dressing Type Transparent;Tube stabilization device  Dressing Status Antimicrobial disc in place;Clean, Dry, Intact  Interventions New dressing  Drainage Description None  Dressing Change Due 11/14/22  Post treatment catheter status Capped and Clamped    Arman Filter, RN AP [facility]

## 2022-11-08 DIAGNOSIS — D62 Acute posthemorrhagic anemia: Secondary | ICD-10-CM | POA: Diagnosis not present

## 2022-11-08 DIAGNOSIS — R109 Unspecified abdominal pain: Secondary | ICD-10-CM

## 2022-11-08 DIAGNOSIS — D649 Anemia, unspecified: Secondary | ICD-10-CM | POA: Diagnosis not present

## 2022-11-08 MED ORDER — OXYCODONE HCL 5 MG PO TABS
5.0000 mg | ORAL_TABLET | ORAL | Status: DC | PRN
  Administered 2022-11-09 – 2022-11-14 (×2): 5 mg via ORAL
  Filled 2022-11-08 (×2): qty 1

## 2022-11-08 MED ORDER — DIPHENHYDRAMINE HCL 50 MG/ML IJ SOLN
50.0000 mg | Freq: Once | INTRAMUSCULAR | Status: DC
  Filled 2022-11-08: qty 1

## 2022-11-08 MED ORDER — DICYCLOMINE HCL 10 MG PO CAPS: 10.0000 mg | ORAL_CAPSULE | Freq: Two times a day (BID) | ORAL | Status: DC | PRN

## 2022-11-08 MED ORDER — GABAPENTIN 100 MG PO CAPS
100.0000 mg | ORAL_CAPSULE | Freq: Every day | ORAL | Status: DC
  Administered 2022-11-08 – 2022-11-14 (×7): 100 mg via ORAL
  Filled 2022-11-08 (×7): qty 1

## 2022-11-08 MED ORDER — HYDRALAZINE HCL 50 MG PO TABS
50.0000 mg | ORAL_TABLET | Freq: Three times a day (TID) | ORAL | Status: DC | PRN
  Administered 2022-11-08 – 2022-11-13 (×6): 50 mg via ORAL
  Filled 2022-11-08 (×2): qty 2
  Filled 2022-11-08: qty 1
  Filled 2022-11-08 (×2): qty 2
  Filled 2022-11-08: qty 1

## 2022-11-08 MED ORDER — DIPHENHYDRAMINE HCL 25 MG PO CAPS
50.0000 mg | ORAL_CAPSULE | Freq: Once | ORAL | Status: DC
  Filled 2022-11-08: qty 2

## 2022-11-08 MED ORDER — PREDNISONE 20 MG PO TABS
50.0000 mg | ORAL_TABLET | Freq: Four times a day (QID) | ORAL | Status: AC
  Administered 2022-11-08 – 2022-11-09 (×2): 50 mg via ORAL
  Filled 2022-11-08 (×3): qty 3

## 2022-11-08 NOTE — Progress Notes (Signed)
PROGRESS NOTE  Shawna [name]:[id number] DOB: 08-06-1940 DOA: 10/28/2022 PCP: [facility]  Brief History:  82 year old female with a history of ESRD (MWF), coronary artery disease status post CABG, CHF, hypertension, hyperlipidemia, paroxysmal atrial fibrillation, hyperlipidemia, GI bleed presenting with generalized weakness and not feeling well after dialysis. At the time of admission, the patient was noted to have a hemoglobin of 7.1.  She was transfused 2 units PRBC.  GI was consulted to assist with management. Unfortunately, the patient had a rapid response called on the evening of 10/30/2022 secondary to altered mental status.  Apparently, the patient was noted to have seizure-like activity by nursing staff.  Apparently the patient was lethargic minimally responsive to painful stimuli.  There were shaking activity lasting 3 minutes noted by nursing staff.  Code stroke was activated.  CT of the brain was negative for hemorrhage.  There was some low-density subdural hygromas.  The patient was seen by teleneurology.  CTA of the head and neck was recommended, but family refused secondary to the patient's contrast allergy.  The patient was loaded with Keppra.  EEG was ordered.  They felt that the patient could stay at [facility] as long as she improves and follows commands.  She returns her to the ICU.  As the evening progressed, the patient was more responsive.  Apparently, the patient was refusing to follow commands but would speak when probed and speech was clear.  The patient did follow some simple commands.  The patient had recent hospitalization from 08/26/22 to 08/29/22 secondary to acute on chronic blood loss anemia.  Hemoccult was positive at that time.  The patmultiple gastric AVMs treated with APC, small hiatal hernia, no active or stigmata of bleeding.  Capsule was placed placed during EGD and showed few small AVMs in the small bowel, likely jejunum.  2 small polyps in  the distal small bowel previously noted. Capsule appears to sit at the ileocolonic anastomosis for the last 2 to 3 hours of study.  She was transfused 2 units PRBC during that hospital admission.  She is not felt to be a candidate for anticoagulation due to her recurrent GI bleeds.  The patient was noted to have hospital delirium and felt to have a degree of underlying cognitive impairment.  She also had a previous hospital admission from 08/06/2022 to 08/17/2022 due to acute on chronic encephalopathy during the hospitalization.  At best, it appears that the patient was oriented only to person at that time.  Family has noted that the patient has had confusion at home since wintertime 2023.  Once again, she had acute on chronic anemia.  She underwent EGD and colonoscopy on 08/10/2022.duodenal and colonic polyps which were removed. 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  She was seen by palliative medicine and full scope of care was continued.   Assessment/Plan: Acute on chronic blood loss anemia -GI consult appreciated -Transfused 1 unit PRBC this admission -Presented with hemoglobin 7.1 -Hemoglobin remained stable since transfusion -Blood loss felt to be secondary to the patient's gastric and small bowel AVMs -4/23 SB enteroscopy--nonbleeding gastric ulcer with clean base; non bleeding jejunal diverticula; no AVMs -she is treated with BID octreotide injections -Hgb remains largely stable around 9, recheck CBC   Myoclonus/Seizure like activity -The patient has had similar episodes of what was described on the evening of 10/30/2022 in the past previously felt to be secondary to anemia and gabapentin in the setting of ESRD  patient -Appreciate neurology consult>>loaded keppra -Continue Keppra per neurologist - no further episodes on keppra   Generalized Abdominal Pain -CT abd/pelvis - suggesting mild ileus and cholelithiasis  -[place] RUQ completed for further gallbladder work up - HIDA scan not recommended by surgical  team -tolerating diet, but endorses post-prandial pain - improved after enema given 4/27 -laxatives added for constipation / mild ileus  -will ask for surgery eval - see surgery consult and recs -question of and concern for underlying mesenteric angina, poor candidate for surgical intervention, discussed with surgeon, will ask IR to evaluate if she could be a candidate for palliative stenting, pt would need CTA study to further evaluate for mesenteric ischemia if pain returns and would likely need to be premedicated and coordinated with dialysis.  GI team planning for CTA on Wed 5/1 with premedication (given history of contrast allergy)  -treating constipation, MOM enema ordered 4/27 and miralax TID ordered  -GI team intensified bowel regimen   ESRD -Dialyzes Monday, Wednesday, Friday -last dialysis 4/29 -Appreciate nephrology follow-up -HD 5/1   Paroxysmal Atrial fibrillation/SVT -Not a candidate for anticoagulation secondary to recurrent GI bleed -Continue amiodarone and metoprolol  -currently in sinus, but has intermittent episodes of SVT   Essential hypertension -Continue home medications-metoprolol and lisinopril -Well-documented that the patient has had uncontrolled hypertension improved with HD volume removal - gave extra dose of metoprolol 25 mg 4/29 after HD due to HTN and tachycardia    Coronary Artery Disease s/p CABG in 2013 with DES to D1 in 10/2016 with cath showing patent LIMA-LAD, SVG-RI, SVG-Mrg and SVG-RCA and repeat cath in 07/2018 showing patent grafts with occlusion of D1 at prior stent site and progression of PDA disease and medical management was recommended -continue imdur, ASA 81 mg, simavastatin   Hypothyroidism -Continue home dose levothyroxine     Cognitive impairment -Documented over the last several hospitalization the patient has had baseline cognitive impairment and hospital delirium -She is at high risk for recurrent delirium due to her metabolic  derangement and hospital stays -10/30/2022 CT brain negative -continue delirium precautions and frequent family visitation       Family Communication:     Consultants:  GI, neurology, renal, surgery   Code Status:  FULL    DVT Prophylaxis:  SCDs  DISPO:  DC held due to ongoing abd pain, requiring further work up with CTA      Procedures: As Listed in Progress Note Above   Antibiotics: None   Subjective: Pt again having 10/10 abd pain when trying to ambulate with mobility team     Objective: Vitals:   11/07/22 2009 11/08/22 0337 11/08/22 0804 11/08/22 1413  BP: (!) 187/75 (!) 160/52  (!) 176/58  Pulse: 64 (!) 55 (!) 56 (!) 58  Resp: 18 18  16   Temp: 98.7 F (37.1 C) 98.6 F (37 C)  98.3 F (36.8 C)  TempSrc: Oral   Oral  SpO2: 97% 95%  97%  Weight:      Height:        Intake/Output Summary (Last 24 hours) at 11/08/2022 1420 Last data filed at 11/08/2022 1353 Gross per 24 hour  Intake 1296.48 ml  Output 2500 ml  Net -1203.52 ml   Weight change:  Exam:  General:  Pt appears chronically ill and intermittently confused, eating breakfast, no distress noted HEENT: supple neck, No icterus, No thrush, No neck mass, Colp/AT Cardiovascular: normal S1/S2, no rubs, no gallops Respiratory: CTA bilaterally, no wheezing, no crackles, no rhonchi  Abdomen: Soft/+BS, generalized abd tenderness, non distended, no guarding Extremities: trace pretibial edema, No lymphangitis, No petechiae, No rashes, no synovitis  Data Reviewed: I have personally reviewed following labs and imaging studies Basic Metabolic Panel: Recent Labs  Lab 11/02/22 0537 11/03/22 1156 11/04/22 0351 11/05/22 0426 11/07/22 0955  NA 131* 133* 132* 133* 131*  K 3.9 3.6 4.4 4.3 4.7  CL 98 97* 97* 97* 94*  CO2 21* 26 23 25  21*  GLUCOSE 103* 138* 74 83 161*  BUN 34* 24* 35* 25* 53*  CREATININE 5.97* 4.62* 5.67* 4.36* 7.96*  CALCIUM 7.2* 7.4* 7.3* 7.6* 8.0*  PHOS 5.3*  --  5.6* 4.9* 7.7*   Liver  Function Tests: Recent Labs  Lab 11/02/22 0537 11/03/22 1156 11/04/22 0351 11/05/22 0426 11/07/22 0955  AST  --  12* 11*  --   --   ALT  --  12 11  --   --   ALKPHOS  --  85 74  --   --   BILITOT  --  0.2* 0.8  --   --   PROT  --  5.9* 5.3*  --   --   ALBUMIN 2.9* 3.0* 2.7*  2.7* 2.7* 3.0*   No results for input(s): "LIPASE", "AMYLASE" in the last 168 hours. No results for input(s): "AMMONIA" in the last 168 hours. Coagulation Profile: No results for input(s): "INR", "PROTIME" in the last 168 hours. CBC: Recent Labs  Lab 11/02/22 0537 11/02/22 1643 11/03/22 1156 11/04/22 0351 11/07/22 0955  WBC 9.6  --  5.4 5.6 7.5  HGB 9.4* 10.3* 9.9* 9.2* 9.6*  HCT 28.2* 30.3* 30.5* 28.2* 27.9*  MCV 105.6*  --  108.9* 107.6* 105.7*  PLT 151  --  180 183 200   Cardiac Enzymes: No results for input(s): "CKTOTAL", "CKMB", "CKMBINDEX", "TROPONINI" in the last 168 hours. BNP: Invalid input(s): "POCBNP" CBG: Recent Labs  Lab 11/01/22 1704 11/02/22 2126 11/03/22 2020  GLUCAP 67* 137* 200*   HbA1C: No results for input(s): "HGBA1C" in the last 72 hours. Urine analysis:    Component Value Date/Time   COLORURINE YELLOW 12/16/2012 1919   APPEARANCEUR CLOUDY (A) 12/16/2012 1919   LABSPEC 1.009 12/16/2012 1919   PHURINE 7.5 12/16/2012 1919   GLUCOSEU NEGATIVE 12/16/2012 1919   HGBUR TRACE (A) 12/16/2012 1919   BILIRUBINUR NEGATIVE 12/16/2012 1919   KETONESUR NEGATIVE 12/16/2012 1919   PROTEINUR 100 (A) 12/16/2012 1919   UROBILINOGEN 0.2 12/16/2012 1919   NITRITE NEGATIVE 12/16/2012 1919   LEUKOCYTESUR SMALL (A) 12/16/2012 1919   Recent Results (from the past 240 hour(s))  MRSA Next Gen by PCR, Nasal     Status: None   Collection Time: 10/30/22  6:52 PM   Specimen: Nasal Mucosa; Nasal Swab  Result Value Ref Range Status   MRSA by PCR Next Gen NOT DETECTED NOT DETECTED Final    Comment: (NOTE) The GeneXpert MRSA Assay (FDA approved for NASAL specimens only), is one component  of a comprehensive MRSA colonization surveillance program. It is not intended to diagnose MRSA infection nor to guide or monitor treatment for MRSA infections. Test performance is not FDA approved in patients less than 89 years old. Performed at [facility], [address] [place], [place] [address]   Culture, blood (Routine X 2) w Reflex to ID Panel     Status: None   Collection Time: 11/02/22  7:24 PM   Specimen: BLOOD  Result Value Ref Range Status   Specimen Description BLOOD BLOOD RIGHT  HAND  Final   Special Requests   Final    BOTTLES DRAWN AEROBIC AND ANAEROBIC Blood Culture adequate volume   Culture   Final    NO GROWTH 5 DAYS Performed at [facility], [address], [place], [place] [address]    Report Status 11/07/2022 FINAL  Final  Culture, blood (Routine X 2) w Reflex to ID Panel     Status: None   Collection Time: 11/02/22  7:32 PM   Specimen: BLOOD  Result Value Ref Range Status   Specimen Description BLOOD BLOOD RIGHT ARM  Final   Special Requests   Final    BOTTLES DRAWN AEROBIC AND ANAEROBIC Blood Culture adequate volume   Culture   Final    NO GROWTH 5 DAYS Performed at [facility], [address] [place], [place] [address]    Report Status 11/07/2022 FINAL  Final     Scheduled Meds:  amiodarone  100 mg Oral Daily   calcitRIOL  1.75 mcg Oral Q M,W,F-HD   Chlorhexidine Gluconate Cloth  6 each Topical Daily   Chlorhexidine Gluconate Cloth  6 each Topical Q0600   Chlorhexidine Gluconate Cloth  6 each Topical Q0600   cinacalcet  30 mg Oral Q M,W,F-HD   [START ON 11/09/2022] diphenhydrAMINE  50 mg Oral Once   Or   [START ON 11/09/2022] diphenhydrAMINE  50 mg Intravenous Once   levETIRAcetam  500 mg Oral Daily   levothyroxine  75 mcg Oral QAC breakfast   lisinopril  20 mg Oral Daily   magic mouthwash  5 mL Oral TID   metoprolol tartrate  12.5 mg Oral BID   octreotide  100 mcg Subcutaneous Q12H   polyethylene glycol  17 g Oral TID   predniSONE  50 mg Oral  Q6H   rosuvastatin  10 mg Oral Daily   sevelamer carbonate  800 mg Oral TID WC   Continuous Infusions:  anticoagulant sodium citrate     piperacillin-tazobactam (ZOSYN)  IV 100 mL/hr at 11/08/22 1353    Procedures/Studies: DG Abd 1 View  Result Date: 11/07/2022 CLINICAL DATA:  Abdominal pain/distention EXAM: ABDOMEN - 1 VIEW COMPARISON:  Abdominal radiograph dated April 27th 2020 FINDINGS: Nonobstructive bowel-gas pattern. Surgical clips seen in the pelvis. No acute osseous abnormality. IMPRESSION: Nonobstructive bowel-gas pattern. Electronically Signed   By: Allegra Lai M.D.   On: 11/07/2022 17:57   DG Abd Portable 2V  Result Date: 11/05/2022 CLINICAL DATA:  Abdominal pain EXAM: PORTABLE ABDOMEN - 2 VIEW COMPARISON:  CT 11/02/2022 FINDINGS: No dilated loops of large or small bowel identified. Increasing stool burden identified within the colon. No air-fluid levels identified. Surgical clips noted within the pelvis. Left lung base opacity identified within the imaged portions of the lower chest. Patient has a left chest wall dialysis catheter with tips in the right atrium. Signs of previous CABG procedure. IMPRESSION: 1. Nonobstructive bowel gas pattern. 2. Increasing stool burden within the colon. 3. Left base opacity identified within the imaged portions of the chest. Correlate for any clinical signs/symptoms of pneumonia. Electronically Signed   By: Signa Kell M.D.   On: 11/05/2022 10:27   US Abdomen Limited RUQ (LIVER/GB)  Result Date: 11/03/2022 CLINICAL DATA:  Right upper quadrant pain EXAM: ULTRASOUND ABDOMEN LIMITED RIGHT UPPER QUADRANT COMPARISON:  CT 11/02/2022.  Older exams as well. FINDINGS: Gallbladder: Gallbladder is mildly distended. No shadowing stones or adjacent fluid but there is gallbladder wall thickening. Common bile duct: Diameter: 4 mm Liver: No focal lesion  identified. Within normal limits in parenchymal echogenicity. Portal vein is patent on color Doppler  imaging with normal direction of blood flow towards the liver. Other: None. IMPRESSION: No shadowing stones. The gallbladder wall is thickened which is nonspecific. No adjacent fluid. No reported sonographic Murphy sign. Please correlate with clinical symptomatology. If there is further concern, HIDA scan could be considered as clinically appropriate. No biliary ductal dilatation Electronically Signed   By: Karen Kays M.D.   On: 11/03/2022 14:59   CT ABDOMEN PELVIS WO CONTRAST  Result Date: 11/02/2022 CLINICAL DATA:  Acute abdominal pain after endoscopy EXAM: CT ABDOMEN AND PELVIS WITHOUT CONTRAST TECHNIQUE: Multidetector CT imaging of the abdomen and pelvis was performed following the standard protocol without IV contrast. RADIATION DOSE REDUCTION: This exam was performed according to the departmental dose-optimization program which includes automated exposure control, adjustment of the mA and/or kV according to patient size and/or use of iterative reconstruction technique. COMPARISON:  CT 04/06/2020 without contrast FINDINGS: Lower chest: Small bilateral pleural effusions identified with some adjacent opacities. Atelectasis is favored over infiltrate recommend follow-up. The heart is enlarged. Status post median sternotomy. Coronary artery calcifications are seen. Hepatobiliary: On this non IV contrast exam, the liver has some small calcifications, possibly related to old granulomatous disease. However the gallbladder is distended with some slight wall thickening and stranding. There is also some luminal high density. Sludge or stones are possible. If there is concern further of acute cholecystitis an ultrasound may be of some benefit as the next step in the workup. Pancreas: Moderate atrophy of the pancreas.  No obvious mass. Spleen: Spleen is nonenlarged. Adrenals/Urinary Tract: The adrenal glands are grossly preserved. There is severe atrophy of the kidneys with several vascular calcifications and tiny  cystic areas. The more complex hyperdense area along the upper pole of the right kidney is stable. Previously measuring 2.8 x 2.0 cm and today 2.5 by 1.5 cm. Contracted urinary bladder. Stomach/Bowel: There is some fluid and debris in the stomach. Moderate diffuse scattered colonic stool. Left-sided colonic diverticulosis. Surgical changes along the rectosigmoid colon. No obstruction. Surgical changes as well along loops of bowel in the right lower quadrant. The appendix is not well seen. There are several loops of small bowel in the midabdomen which have air-fluid levels and are mildly dilated measuring up to 3.2 cm. No abrupt caliber change. Vascular/Lymphatic: Extensive vascular calcifications. Normal caliber aorta and IVC. No specific abnormal lymph node enlargement identified in the abdomen and pelvis. Reproductive: Status post hysterectomy. No adnexal masses. Other: Anasarca. No obstruction, free air or free fluid. Mild areas of mesenteric stranding and presacral space stranding. Musculoskeletal: Osteopenia. Scattered degenerative changes. Schmorl's node changes along lumbar spine and thoracic spine. IMPRESSION: Once again severely atrophic kidneys with calcifications and cystic areas including complex area along the upper pole of the right kidney which is stable in size. Recommend continued surveillance. Developing small bilateral pleural effusion with adjacent lung opacities. Atelectasis versus infiltrate. Recommend follow-up. Increasing anasarca mesenteric stranding. There are some mildly dilated loops of small bowel with some air-fluid levels but no abrupt transition. Ileus versus mild partial obstruction is in the differential recommend close follow-up. Colonic diverticulosis. Distended gallbladder with wall thickening and some stranding. In addition there is some high attenuation material in the gallbladder. Please correlate with any sludge or stones. Please correlate with any prior contrast  administration. Overall recommend further workup with ultrasound to further delineate when able. Electronically Signed   By: Karen Kays M.D.   On:  11/02/2022 14:27   MR BRAIN WO CONTRAST  Result Date: 10/31/2022 CLINICAL DATA:  Altered mental status EXAM: MRI HEAD WITHOUT CONTRAST TECHNIQUE: Multiplanar, multiecho pulse sequences of the brain and surrounding structures were obtained without intravenous contrast. COMPARISON:  04/23/2022 MRI, correlation is also made with 10/30/2022 CT head FINDINGS: Brain: No restricted diffusion to suggest acute or subacute infarct. No acute hemorrhage, mass, or midline shift. No hydrocephalus. 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 Normal pituitary and craniocervical junction. Remote lacunar infarcts in the bilateral basal ganglia and left cerebellum. Punctate foci of hemosiderin deposition in the right cerebellum, right campus, and right frontal and parietal lobes, possibly sequela of prior microhemorrhage. No superficial siderosis. Confluent T2 hyperintense signal in the periventricular white matter, likely the sequela of moderate chronic small vessel ischemic disease. Vascular: Normal arterial flow voids. Skull and upper cervical spine: Normal marrow signal. Sinuses/Orbits: Mucosal thickening in the left sphenoid sinus. No acute finding in the orbits. Other: Trace fluid in the mastoid air cells. IMPRESSION: 1. No acute intracranial process. No evidence of acute or subacute infarct. 2. 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 Electronically Signed   By: Wiliam Ke M.D.   On: 10/31/2022 19:31   EEG adult  Result Date: 10/31/2022 Charlsie Quest, MD     10/31/2022  2:57 PM Patient Name: Shawna [name] MRN: [id number] Epilepsy Attending: Charlsie Quest Referring  Physician/Provider: Rejeana Brock, MD Date:10/31/2022 Duration: 22.39 mins Patient history: 82yo F getting eeg to evaluate for seizure Level of alertness: Awake, asleep AEDs during EEG study: LEV Technical aspects: This EEG study was done with scalp electrodes positioned according to the 10-20 International system of electrode placement. Electrical activity was reviewed with band pass filter of 1-70Hz , sensitivity of 7 uV/mm, display speed of 9mm/sec with a 60Hz  notched filter applied as appropriate. EEG data were recorded continuously and digitally stored.  Video monitoring was available and reviewed as appropriate. Description: The posterior dominant rhythm consists of 8-9 Hz activity of moderate voltage (25-35 uV) seen predominantly in posterior head regions, symmetric and reactive to eye opening and eye closing. Sleep was characterized by vertex waves, sleep spindles (12 to 14 Hz), maximal frontocentral region.  EEG showed intermittent generalized 3 to 6 Hz theta-delta slowing. Hyperventilation and photic stimulation were not performed.   ABNORMALITY - Intermittent slow, generalized IMPRESSION: This study is suggestive of mild diffuse encephalopathy. No seizures or epileptiform discharges were seen throughout the recording. Charlsie Quest   DG Abd 1 View  Result Date: 10/31/2022 CLINICAL DATA:  Abdominal pain EXAM: ABDOMEN - 1 VIEW COMPARISON:  08/29/2022 FINDINGS: Bowel gas pattern is nonspecific. Gas and stool are noted in colon. Small amount of stool is seen in colon. There are surgical staples and clips in lower abdomen and pelvis. There is no identifiable electronic device in abdomen and pelvis in the current study. In the previous examination, an electronic device, capsule most noted in region of rectum suggesting interval passage. Arterial calcifications are seen. IMPRESSION: Nonspecific bowel gas pattern. There is no evidence of any metallic electronic capsule in the abdomen and pelvis.  Electronically Signed   By: Ernie Avena M.D.   On: 10/31/2022 08:40   CT HEAD CODE STROKE WO CONTRAST  Result Date:  10/30/2022 CLINICAL DATA:  Code stroke.  Seizure, altered mental status. EXAM: CT HEAD WITHOUT CONTRAST TECHNIQUE: Contiguous axial images were obtained from the base of the skull through the vertex without intravenous contrast. RADIATION DOSE REDUCTION: This exam was performed according to the departmental dose-optimization program which includes automated exposure control, adjustment of the mA and/or kV according to patient size and/or use of iterative reconstruction technique. COMPARISON:  CT head 08/26/2022. FINDINGS: Brain: There is no evidence of acute territorial infarct. There are low-density subdural collections overlying both cerebral hemispheres measuring up to 6 mm on the left and 3 mm on the right without evidence of acute blood products, new since the prior head CT from 08/26/2022. There is no mass effect on the underlying brain parenchyma. Parenchymal volume loss with prominence of the ventricular system and extra-axial CSF spaces is stable. The ventricles are unchanged in size and configuration. A remote infarct in the left basal ganglia is unchanged. Additional confluent hypodensity in the supratentorial white matter likely reflects sequela of underlying chronic small-vessel ischemic change. The pituitary and suprasellar region are normal. There is no mass lesion. There is no midline shift. Vascular: There is calcification of the bilateral carotid siphons and vertebral arteries. Skull: Normal. Negative for fracture or focal lesion. Sinuses/Orbits: The paranasal sinuses are clear. The globes and orbits are unremarkable. Other: None. ASPECTS [facility] Stroke Program Early CT Score) - Ganglionic level infarction (caudate, lentiform nuclei, internal capsule, insula, M1-M3 cortex): 7 - Supraganglionic infarction (M4-M6 cortex): 3 Total score (0-10 with 10 being normal): 10  IMPRESSION: 1. No evidence of acute intracranial hemorrhage or acute territorial infarct. 2. Low-density subdural hygromas overlying both cerebral hemispheres measuring up to 6 mm on the left and 3 mm on the right, new since the ct head from 08/26/2022 but without evidence of acute blood within the collections. These results communicated via Amion at the time of interpretation on 10/30/2022 at 6:44 pm to provider [facility] , who verbally acknowledged these results. Electronically Signed   By: Lesia Hausen M.D.   On: 10/30/2022 18:46   DG Chest Port 1 View  Result Date: 10/28/2022 CLINICAL DATA:  Cough EXAM: PORTABLE CHEST 1 VIEW COMPARISON:  10/23/2022 FINDINGS: Prior CABG. Left dialysis catheter remains in place with the tip in the right atrium. Mild cardiomegaly. No overt edema. Bibasilar atelectasis. No effusions or acute bony abnormality. IMPRESSION: Cardiomegaly, bibasilar atelectasis. Electronically Signed   By: Charlett Nose M.D.   On: 10/28/2022 22:15   CT Chest Wo Contrast  Result Date: 10/23/2022 CLINICAL DATA:  Pneumonia, complication suspected, xray done EXAM: CT CHEST WITHOUT CONTRAST TECHNIQUE: Multidetector CT imaging of the chest was performed following the standard protocol without IV contrast. RADIATION DOSE REDUCTION: This exam was performed according to the departmental dose-optimization program which includes automated exposure control, adjustment of the mA and/or kV according to patient size and/or use of iterative reconstruction technique. COMPARISON:  Radiograph earlier today. Additional prior radiographs reviewed. Most recent chest CT available 07/16/2021 FINDINGS: Cardiovascular: Left-sided dialysis catheter tip is in the atrial caval junction. Mild cardiomegaly. Post CABG with calcification of native coronary arteries. Decreased density of the blood pool typical of anemia. Advanced aortic atherosclerosis with aortic tortuosity. No aneurysm or periaortic stranding. No  pericardial effusion. Mediastinum/Nodes: 14 mm lower anterior paratracheal node, similar to 2023 exam. Scattered additional shoddy mediastinal lymph nodes are also unchanged. No progressive adenopathy. Patulous esophagus. Lungs/Pleura: Small bilateral pleural effusions, left slightly larger than right. Slight vascular congestion. Minimal fluid in  the fissures. Mild patchy ground-glass opacity in the dependent left greater than right lower lobe. Moderate central bronchial thickening which may be congestive or bronchitic. no endobronchial debris. No pulmonary mass. Upper Abdomen: Sequela of chronic renal disease with renal atrophy and cysts. No specific imaging follow-up of these cysts is recommended. A peripherally calcified density in the right upper kidney is stable from 2023 exam and consistent with a complex cyst, also needing no further follow-up. Left colonic diverticulosis. Musculoskeletal: Exaggerated thoracic kyphosis with degenerative change throughout the thoracic spine. Chronic mild L1 compression deformity. Slight increase in Schmorl's node inferior endplate of T11. Prior median sternotomy. There are no acute or suspicious osseous abnormalities. IMPRESSION: 1. Small bilateral pleural effusions, left slightly larger than right. Slight vascular congestion. 2. Mild patchy ground-glass opacity in the dependent left greater than right lower lobe may be atelectasis or pneumonia. 3. Moderate central bronchial thickening may be congestive or bronchitic. Aortic Atherosclerosis (ICD10-I70.0). Electronically Signed   By: Narda Rutherford M.D.   On: 10/23/2022 15:35   DG Chest Port 1 View  Result Date: 10/23/2022 CLINICAL DATA:  82 year old female with cough. Productive cough for 2 weeks. Some shortness of breath. EXAM: PORTABLE CHEST 1 VIEW COMPARISON:  Chest radiographs 10/02/2022 and earlier. FINDINGS: Portable AP upright view at 1107 hours. Prior CABG. Stable cardiomegaly and mediastinal contours. Calcified  aortic atherosclerosis. Stable left chest dual lumen dialysis type catheter. Patchy, nonspecific increased left lung base opacity partially obscuring the diaphragm now. No superimposed pneumothorax or consolidation. Stable pulmonary vascularity without overt edema. No confluent right lung opacity. Stable visualized osseous structures.  Negative visible bowel gas. IMPRESSION: 1. Patchy, nonspecific increased left lung base opacity new since last month. PA and lateral views may be helpful if feasible. 2. Otherwise stable cardiomegaly, pulmonary vascularity, prior CABG, chest dialysis catheter, Aortic Atherosclerosis. Electronically Signed   By: Odessa Fleming M.D.   On: 10/23/2022 11:16    Standley Dakins, MD  [facility]  If 7PM-7AM, please contact night-coverage [email or web address] Password [email or web address] 11/08/2022, 2:20 PM   LOS: 10 days

## 2022-11-08 NOTE — Progress Notes (Signed)
[facility] Progress Note   Assessment/ Plan:   ESRD MWF L IJ TDC [facility]: on schedule., no heparin, Max UF 2L.  Next Hd tomorrow ABLA with likely UGI source, GI following. Transfused at presentation.  EGD/enteroscopy 4/23 wit GI-  was trending down and has not been checked in a few days-  but yest was stable HTN/Vol:BPs improved with UF.  Still looks like a little volume to give.also on zestril and lopressor Anemia: as above CKD-BMD: Ca corrects to around 8, and P ok.  Continue calcitriol.  Cinacalcet is 3 times weekly.  Resume renvela  AMS: Neurology s/o.  Waxing/waning.  Likely delirium.  Some concern for seizures. Abd pain: Ct looks like ileus ? Cholecystitis- blood cultures drawn, started Zosyn -  per GI and surgery-  there was some talk about mesenteric stenting -  now for CTA Dispo: pending  Subjective:    HD yest-  removed 2500-  tolerated well.  As far as abd pain, the plan seems to be for her to get a CTA to rule out mesenteric ischemia.  She is very pleasant this AM-  cleaned her breakfast tray - says abd pain is better    Objective:   BP (!) 160/52 (BP Location: Right Arm)   Pulse (!) 56   Temp 98.6 F (37 C)   Resp 18   Ht 5\' 5"  (1.651 m)   Wt 57 kg   SpO2 95%   BMI 20.91 kg/m   Physical Exam: Gen:NAD, sitting in bed CVS: RRR Resp: muffled at bases Abd: soft Ext: trace LE edema, 1+ UE edema, R > L ACCESS: clotted AVF, L TDC + T/B  Labs: BMET Recent Labs  Lab 11/01/22 1055 11/02/22 0537 11/03/22 1156 11/04/22 0351 11/05/22 0426 11/07/22 0955  NA 132* 131* 133* 132* 133* 131*  K 3.9 3.9 3.6 4.4 4.3 4.7  CL 97* 98 97* 97* 97* 94*  CO2 25 21* 26 23 25  21*  GLUCOSE 87 103* 138* 74 83 161*  BUN 30* 34* 24* 35* 25* 53*  CREATININE 5.05* 5.97* 4.62* 5.67* 4.36* 7.96*  CALCIUM 7.2* 7.2* 7.4* 7.3* 7.6* 8.0*  PHOS  --  5.3*  --  5.6* 4.9* 7.7*   CBC Recent Labs  Lab 11/02/22 0537 11/02/22 1643 11/03/22 1156 11/04/22 0351  11/07/22 0955  WBC 9.6  --  5.4 5.6 7.5  HGB 9.4* 10.3* 9.9* 9.2* 9.6*  HCT 28.2* 30.3* 30.5* 28.2* 27.9*  MCV 105.6*  --  108.9* 107.6* 105.7*  PLT 151  --  180 183 200      Medications:     amiodarone  100 mg Oral Daily   calcitRIOL  1.75 mcg Oral Q M,W,F-HD   Chlorhexidine Gluconate Cloth  6 each Topical Daily   Chlorhexidine Gluconate Cloth  6 each Topical Q0600   Chlorhexidine Gluconate Cloth  6 each Topical Q0600   cinacalcet  30 mg Oral Q M,W,F-HD   [START ON 11/09/2022] diphenhydrAMINE  50 mg Oral Once   Or   [START ON 11/09/2022] diphenhydrAMINE  50 mg Intravenous Once   levETIRAcetam  500 mg Oral Daily   levothyroxine  75 mcg Oral QAC breakfast   lisinopril  20 mg Oral Daily   magic mouthwash  5 mL Oral TID   metoprolol tartrate  12.5 mg Oral BID   octreotide  100 mcg Subcutaneous Q12H   polyethylene glycol  17 g Oral TID   predniSONE  50 mg Oral Q6H  rosuvastatin  10 mg Oral Daily   sevelamer carbonate  800 mg Oral TID WC     Nechuma Boven A Marjory Meints  11/08/2022, 8:48 AM

## 2022-11-08 NOTE — Progress Notes (Signed)
Patient pulled out IV access 

## 2022-11-08 NOTE — Progress Notes (Signed)
Mobility Specialist Progress Note:   11/08/22 1308  Mobility  Activity Transferred from bed to chair  Level of Assistance Minimal assist, patient does 75% or more  Assistive Device Front wheel walker  Distance Ambulated (ft) 3 ft  Activity Response Tolerated well  Mobility Referral Yes  $Mobility charge 1 Mobility   Pt agreeable to mobility session, received in bed. Tolerated transfer well, very pleasant, asx throughout. Pt sitting up in chair, alarm on, all needs met.   Feliciana Rossetti Mobility Specialist Please contact via Special educational needs teacher or  Rehab office at [phone]

## 2022-11-08 NOTE — Telephone Encounter (Signed)
Patient still inpatient.

## 2022-11-08 NOTE — Progress Notes (Signed)
IV access restart via ultrasound guidance by [facility], RN. Patient tolerated fair.

## 2022-11-08 NOTE — Progress Notes (Signed)
Subjective: Drowsy this morning. Difficult getting patient to stay awake and not giving consistent history. Reports she continues with abdominal pain. Comes "when it wants to". Occurred after breakast this morning but ate 100%. No nausea or vomiting. Reports she is having bowel movements, but only 1 BM documented yesterday and she refused all miraLAX yesterday. She did take MiraLAX this morning, but no BM so far. Reports pain will improve some with a BM.   Objective: Vital signs in last 24 hours: Temp:  [97.7 F (36.5 C)-98.7 F (37.1 C)] 98.6 F (37 C) (04/30 0337) Pulse Rate:  [55-118] 56 (04/30 0804) Resp:  [14-21] 18 (04/30 0337) BP: (115-215)/(52-100) 160/52 (04/30 0337) SpO2:  [95 %-98 %] 95 % (04/30 0337) Weight:  [57 kg] 57 kg (04/29 1430) Last BM Date : 11/07/22 General:   Somnolent, arouses easily, but falls asleep during conversation intermittently, no acute distress. Head:  Normocephalic and atraumatic. Abdomen:  Bowel sounds present, soft, nondistended, mild generalized tenderness to palpation, greatest in the left abdomen.  No rebound or guarding. Msk:  Symmetrical without gross deformities. Normal posture. Extremities:  Without edema. Neurologic: Alert and oriented x 3. Psych:  Normal mood and affect.  Intake/Output from previous day: 04/29 0701 - 04/30 0700 In: 720 [P.O.:720] Out: 2500  Intake/Output this shift: Total I/O In: 240 [P.O.:240] Out: -   Lab Results: Recent Labs    11/07/22 0955  WBC 7.5  HGB 9.6*  HCT 27.9*  PLT 200   BMET Recent Labs    11/07/22 0955  NA 131*  K 4.7  CL 94*  CO2 21*  GLUCOSE 161*  BUN 53*  CREATININE 7.96*  CALCIUM 8.0*   LFT Recent Labs    11/07/22 0955  ALBUMIN 3.0*    Studies/Results: DG Abd 1 View  Result Date: 11/07/2022 CLINICAL DATA:  Abdominal pain/distention EXAM: ABDOMEN - 1 VIEW COMPARISON:  Abdominal radiograph dated April 27th 2020 FINDINGS: Nonobstructive bowel-gas pattern. Surgical  clips seen in the pelvis. No acute osseous abnormality. IMPRESSION: Nonobstructive bowel-gas pattern. Electronically Signed   By: Allegra Lai M.D.   On: 11/07/2022 17:57    Assessment: 82 y.o. female with a history of dementia, ESRD on HD, A-fib on ASA, CAD, CHF, small bowel AVMs, GERD, HTN, colon cancer s/p resection in 1992, duodenal adenoma who presented to the hospital with weakness and fatigue following dialysis.    Hgb 7.1 on admission, receiving 1 unit PRBCs. Hemoglobin has remained stable in the 9 range since 4/20. EGD/enteroscopy this admission with non-bleeding gastric ulcer s/p biopsy, jejunal polyp s/p biopsy, non-bleeding jejunal diverticula. Known AVMs on capsule in the past. Clinically without overt GI bleeding and stabilized from this standpoint.   She continues to report abdominal pain. CT on 4/24 with possible ileus, distended gallbladder. [place] with non-specific gallbladder wall thickening. No gallstones. Gen Surgery has seen patient and felt likely not dealing with biliary colic. She is ordered to have MiraLAX TID, but refused it the last 48 hours. She did take her dose this morning. 1 documented BM yesterday. No BM so far today. Bowel sounds present. DG abdomen yesterday with nonobstructive bowel gas pattern.  She does report some improvement in pain after a BM though history is not consistent. As she reports postprandial pain, there is concern for chronic mesenteric ischemia and CTA has been ordered for 5/1. However, I do note that she ate 100% of her breakfast this morning. She remains without an acute abdomen   Plan:.  Plans for CTA tomorrow prior to dialysis. Will need to have prednisone 50 mg at 13 hours prior, 7 hours prior, and 1 hour prior to scan. Will need Benadryl 50 mg one hour prior to scan.  MiraLAX TID. Discussed with patient that she needs to take this as prescribed.  Continue octreotide injections.     LOS: 10 days    11/08/2022, 10:19 AM   Ermalinda Memos,  PA-C [facility]

## 2022-11-09 DIAGNOSIS — D539 Nutritional anemia, unspecified: Secondary | ICD-10-CM

## 2022-11-09 DIAGNOSIS — R1033 Periumbilical pain: Secondary | ICD-10-CM | POA: Diagnosis not present

## 2022-11-09 LAB — RENAL FUNCTION PANEL
Albumin: 3.1 g/dL — ABNORMAL LOW (ref 3.5–5.0)
Anion gap: 16 — ABNORMAL HIGH (ref 5–15)
BUN: 60 mg/dL — ABNORMAL HIGH (ref 8–23)
CO2: 23 mmol/L (ref 22–32)
Calcium: 8.4 mg/dL — ABNORMAL LOW (ref 8.9–10.3)
Chloride: 90 mmol/L — ABNORMAL LOW (ref 98–111)
Creatinine, Ser: 7.55 mg/dL — ABNORMAL HIGH (ref 0.44–1.00)
GFR, Estimated: 5 mL/min — ABNORMAL LOW (ref >60)
Glucose, Bld: 198 mg/dL — ABNORMAL HIGH (ref 70–99)
Phosphorus: 6.9 mg/dL — ABNORMAL HIGH (ref 2.5–4.6)
Potassium: 4.1 mmol/L (ref 3.5–5.1)
Sodium: 129 mmol/L — ABNORMAL LOW (ref 135–145)

## 2022-11-09 LAB — CBC
HCT: 30.6 % — ABNORMAL LOW (ref 36.0–46.0)
Hemoglobin: 10.3 g/dL — ABNORMAL LOW (ref 12.0–15.0)
MCH: 35.8 pg — ABNORMAL HIGH (ref 26.0–34.0)
MCHC: 33.7 g/dL (ref 30.0–36.0)
MCV: 106.3 fL — ABNORMAL HIGH (ref 80.0–100.0)
Platelets: 220 10*3/uL (ref 150–400)
RBC: 2.88 MIL/uL — ABNORMAL LOW (ref 3.87–5.11)
RDW: 24.6 % — ABNORMAL HIGH (ref 11.5–15.5)
WBC: 9.1 10*3/uL (ref 4.0–10.5)
nRBC: 0 % (ref 0.0–0.2)

## 2022-11-09 MED ORDER — HEPARIN SODIUM (PORCINE) 1000 UNIT/ML IJ SOLN
INTRAMUSCULAR | Status: AC
  Filled 2022-11-09: qty 4

## 2022-11-09 MED ORDER — HYDRALAZINE HCL 20 MG/ML IJ SOLN
INTRAMUSCULAR | Status: AC
  Filled 2022-11-09: qty 1

## 2022-11-09 MED ORDER — HYDRALAZINE HCL 20 MG/ML IJ SOLN
10.0000 mg | Freq: Once | INTRAMUSCULAR | Status: AC
  Administered 2022-11-09: 10 mg via INTRAVENOUS

## 2022-11-09 NOTE — Progress Notes (Signed)
PROGRESS NOTE  Shawna [name] EAV:[id number] DOB: 14-Feb-1941 DOA: 10/28/2022 PCP: [facility]  Brief History:  82 year old female with a history of ESRD (MWF), coronary artery disease status post CABG, CHF, hypertension, hyperlipidemia, paroxysmal atrial fibrillation, hyperlipidemia, GI bleed presenting with generalized weakness and not feeling well after dialysis. At the time of admission, the patient was noted to have a hemoglobin of 7.1.  She was transfused 2 units PRBC.  GI was consulted to assist with management. Unfortunately, the patient had a rapid response called on the evening of 10/30/2022 secondary to altered mental status.  Apparently, the patient was noted to have seizure-like activity by nursing staff.  Apparently the patient was lethargic minimally responsive to painful stimuli.  There were shaking activity lasting 3 minutes noted by nursing staff.  Code stroke was activated.  CT of the brain was negative for hemorrhage.  There was some low-density subdural hygromas.  The patient was seen by teleneurology.  CTA of the head and neck was recommended, but family refused secondary to the patient's contrast allergy.  The patient was loaded with Keppra.  EEG was ordered.  They felt that the patient could stay at [facility] as long as she improves and follows commands.  She returns her to the ICU.  As the evening progressed, the patient was more responsive.  Apparently, the patient was refusing to follow commands but would speak when probed and speech was clear.  The patient did follow some simple commands.  The patient had recent hospitalization from 08/26/22 to 08/29/22 secondary to acute on chronic blood loss anemia.  Hemoccult was positive at that time.  The patmultiple gastric AVMs treated with APC, small hiatal hernia, no active or stigmata of bleeding.  Capsule was placed placed during EGD and showed few small AVMs in the small bowel, likely jejunum.  2 small polyps in  the distal small bowel previously noted. Capsule appears to sit at the ileocolonic anastomosis for the last 2 to 3 hours of study.  She was transfused 2 units PRBC during that hospital admission.  She is not felt to be a candidate for anticoagulation due to her recurrent GI bleeds.  The patient was noted to have hospital delirium and felt to have a degree of underlying cognitive impairment.  She also had a previous hospital admission from 08/06/2022 to 08/17/2022 due to acute on chronic encephalopathy during the hospitalization.  At best, it appears that the patient was oriented only to person at that time.  Family has noted that the patient has had confusion at home since wintertime 2023.  Once again, she had acute on chronic anemia.  She underwent EGD and colonoscopy on 08/10/2022.duodenal and colonic polyps which were removed. 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  She was seen by palliative medicine and full scope of care was continued.   Assessment/Plan: Acute on chronic blood loss anemia -GI consult appreciated -Transfused 1 unit PRBC this admission -Presented with hemoglobin 7.1 -Hemoglobin remained stable since transfusion -Blood loss felt to be secondary to the patient's gastric and small bowel AVMs -4/23 SB enteroscopy--nonbleeding gastric ulcer with clean base; non bleeding jejunal diverticula; no AVMs -she is treated with BID octreotide injections -Hgb remains largely stable around 10   Myoclonus/Seizure like activity -The patient has had similar episodes of what was described on the evening of 10/30/2022 in the past previously felt to be secondary to anemia and gabapentin in the setting of ESRD patient -Appreciate  neurology consult>>loaded keppra -Continue Keppra per neurologist - no further episodes on keppra   Generalized Abdominal Pain -CT abd/pelvis - suggesting mild ileus and cholelithiasis  -[place] RUQ completed for further gallbladder work up - HIDA scan not recommended by surgical  team -tolerating diet, but endorses post-prandial pain - improved after enema given 4/27 -laxatives added for constipation / mild ileus  -will ask for surgery eval - see surgery consult and recs -question of and concern for underlying mesenteric angina, poor candidate for surgical intervention, discussed with surgeon, will ask IR to evaluate if she could be a candidate for palliative stenting, pt would need CTA study to further evaluate for mesenteric ischemia if pain returns and would likely need to be premedicated and coordinated with dialysis.  GI team planning for CTA on Wed 5/1 with premedication (given history of contrast allergy) ** due to loss of IV access CTA canceled 5/1. GI considering [place] study of mesenteric arteries.  -treating constipation, MOM enema ordered 4/27 and miralax TID ordered  -GI team intensified bowel regimen  -also there is concern that octreotide injections causing the intermittent abdominal pain, Dr. Levon Hedger spoke with patient and family and they want to continue at this time.   ESRD -Dialyzes Monday, Wednesday, Friday -last dialysis 4/29 -Appreciate nephrology follow-up -HD 5/1   Paroxysmal Atrial fibrillation/SVT -Not a candidate for anticoagulation secondary to recurrent GI bleed -Continue amiodarone and metoprolol  -currently in sinus, but has intermittent episodes of SVT   Essential hypertension -Continue home medications-metoprolol and lisinopril -Well-documented that the patient has had uncontrolled hypertension improved with HD volume removal - gave extra dose of metoprolol 25 mg 4/29 after HD due to HTN and tachycardia    Coronary Artery Disease s/p CABG in 2013 with DES to D1 in 10/2016 with cath showing patent LIMA-LAD, SVG-RI, SVG-Mrg and SVG-RCA and repeat cath in 07/2018 showing patent grafts with occlusion of D1 at prior stent site and progression of PDA disease and medical management was recommended -continue imdur, ASA 81 mg, simavastatin    Hypothyroidism -Continue home dose levothyroxine     Cognitive impairment -Documented over the last several hospitalization the patient has had baseline cognitive impairment and hospital delirium -She is at high risk for recurrent delirium due to her metabolic derangement and hospital stays -10/30/2022 CT brain negative -continue delirium precautions and frequent family visitation       Family Communication:     Consultants:  GI, neurology, renal, surgery   Code Status:  FULL    DVT Prophylaxis:  SCDs  DISPO:  DC held due to ongoing abd pain, requiring further work up with CTA, GI considering getting an [place] study of mesenteric arteries, if no further testing planned, anticipate Can Discharge home 5/2     Procedures: As Listed in Progress Note Above   Antibiotics: None   Subjective: Pt upset at multiple attempts at IV access but not able to achieve any IV access and CTA had to be canceled. Intermittently having severe abdominal pain.     Objective: Vitals:   11/08/22 2055 11/09/22 0304 11/09/22 0750 11/09/22 1144  BP: (!) 187/73 (!) 170/65 (!) 185/72 (!) 179/71  Pulse: 65 (!) 56 64 60  Resp: 18 16    Temp: 99 F (37.2 C) 98.7 F (37.1 C)    TempSrc:      SpO2: 99% 99%  100%  Weight:      Height:        Intake/Output Summary (Last 24 hours) at 11/09/2022  1237 Last data filed at 11/09/2022 0609 Gross per 24 hour  Intake 1056.48 ml  Output --  Net 1056.48 ml   Weight change:  Exam:  General:  Pt appears chronically ill and intermittently confused, eating breakfast, no distress noted HEENT: supple neck, No icterus, No thrush, No neck mass, Coward/AT Cardiovascular: normal S1/S2, no rubs, no gallops Respiratory: CTA bilaterally, no wheezing, no crackles, no rhonchi Abdomen: Soft/+BS, generalized abd tenderness, non distended, no guarding Extremities: trace pretibial edema, No lymphangitis, No petechiae, No rashes, no synovitis  Data Reviewed: I have personally  reviewed following labs and imaging studies Basic Metabolic Panel: Recent Labs  Lab 11/03/22 1156 11/04/22 0351 11/05/22 0426 11/07/22 0955  NA 133* 132* 133* 131*  K 3.6 4.4 4.3 4.7  CL 97* 97* 97* 94*  CO2 26 23 25  21*  GLUCOSE 138* 74 83 161*  BUN 24* 35* 25* 53*  CREATININE 4.62* 5.67* 4.36* 7.96*  CALCIUM 7.4* 7.3* 7.6* 8.0*  PHOS  --  5.6* 4.9* 7.7*   Liver Function Tests: Recent Labs  Lab 11/03/22 1156 11/04/22 0351 11/05/22 0426 11/07/22 0955  AST 12* 11*  --   --   ALT 12 11  --   --   ALKPHOS 85 74  --   --   BILITOT 0.2* 0.8  --   --   PROT 5.9* 5.3*  --   --   ALBUMIN 3.0* 2.7*  2.7* 2.7* 3.0*   No results for input(s): "LIPASE", "AMYLASE" in the last 168 hours. No results for input(s): "AMMONIA" in the last 168 hours. Coagulation Profile: No results for input(s): "INR", "PROTIME" in the last 168 hours. CBC: Recent Labs  Lab 11/02/22 1643 11/03/22 1156 11/04/22 0351 11/07/22 0955 11/09/22 0351  WBC  --  5.4 5.6 7.5 9.1  HGB 10.3* 9.9* 9.2* 9.6* 10.3*  HCT 30.3* 30.5* 28.2* 27.9* 30.6*  MCV  --  108.9* 107.6* 105.7* 106.3*  PLT  --  180 183 200 220   Cardiac Enzymes: No results for input(s): "CKTOTAL", "CKMB", "CKMBINDEX", "TROPONINI" in the last 168 hours. BNP: Invalid input(s): "POCBNP" CBG: Recent Labs  Lab 11/02/22 2126 11/03/22 2020  GLUCAP 137* 200*   HbA1C: No results for input(s): "HGBA1C" in the last 72 hours. Urine analysis:    Component Value Date/Time   COLORURINE YELLOW 12/16/2012 1919   APPEARANCEUR CLOUDY (A) 12/16/2012 1919   LABSPEC 1.009 12/16/2012 1919   PHURINE 7.5 12/16/2012 1919   GLUCOSEU NEGATIVE 12/16/2012 1919   HGBUR TRACE (A) 12/16/2012 1919   BILIRUBINUR NEGATIVE 12/16/2012 1919   KETONESUR NEGATIVE 12/16/2012 1919   PROTEINUR 100 (A) 12/16/2012 1919   UROBILINOGEN 0.2 12/16/2012 1919   NITRITE NEGATIVE 12/16/2012 1919   LEUKOCYTESUR SMALL (A) 12/16/2012 1919   Recent Results (from the past 240  hour(s))  MRSA Next Gen by PCR, Nasal     Status: None   Collection Time: 10/30/22  6:52 PM   Specimen: Nasal Mucosa; Nasal Swab  Result Value Ref Range Status   MRSA by PCR Next Gen NOT DETECTED NOT DETECTED Final    Comment: (NOTE) The GeneXpert MRSA Assay (FDA approved for NASAL specimens only), is one component of a comprehensive MRSA colonization surveillance program. It is not intended to diagnose MRSA infection nor to guide or monitor treatment for MRSA infections. Test performance is not FDA approved in patients less than 63 years old. Performed at [facility], [address] [place], [place] [address]   Culture, blood (Routine  X 2) w Reflex to ID Panel     Status: None   Collection Time: 11/02/22  7:24 PM   Specimen: BLOOD  Result Value Ref Range Status   Specimen Description BLOOD BLOOD RIGHT HAND  Final   Special Requests   Final    BOTTLES DRAWN AEROBIC AND ANAEROBIC Blood Culture adequate volume   Culture   Final    NO GROWTH 5 DAYS Performed at [facility], [address], [place], [place] [address]    Report Status 11/07/2022 FINAL  Final  Culture, blood (Routine X 2) w Reflex to ID Panel     Status: None   Collection Time: 11/02/22  7:32 PM   Specimen: BLOOD  Result Value Ref Range Status   Specimen Description BLOOD BLOOD RIGHT ARM  Final   Special Requests   Final    BOTTLES DRAWN AEROBIC AND ANAEROBIC Blood Culture adequate volume   Culture   Final    NO GROWTH 5 DAYS Performed at [facility], [address] [place], [place] [address]    Report Status 11/07/2022 FINAL  Final     Scheduled Meds:  amiodarone  100 mg Oral Daily   calcitRIOL  1.75 mcg Oral Q M,W,F-HD   Chlorhexidine Gluconate Cloth  6 each Topical Daily   Chlorhexidine Gluconate Cloth  6 each Topical Q0600   Chlorhexidine Gluconate Cloth  6 each Topical Q0600   cinacalcet  30 mg Oral Q M,W,F-HD   diphenhydrAMINE  50 mg Oral Once   Or   diphenhydrAMINE  50 mg Intravenous Once    gabapentin  100 mg Oral QHS   levETIRAcetam  500 mg Oral Daily   levothyroxine  75 mcg Oral QAC breakfast   lisinopril  20 mg Oral Daily   magic mouthwash  5 mL Oral TID   metoprolol tartrate  12.5 mg Oral BID   octreotide  100 mcg Subcutaneous Q12H   polyethylene glycol  17 g Oral TID   predniSONE  50 mg Oral Q6H   rosuvastatin  10 mg Oral Daily   sevelamer carbonate  800 mg Oral TID WC   Continuous Infusions:  anticoagulant sodium citrate      Procedures/Studies: DG Abd 1 View  Result Date: 11/07/2022 CLINICAL DATA:  Abdominal pain/distention EXAM: ABDOMEN - 1 VIEW COMPARISON:  Abdominal radiograph dated April 27th 2020 FINDINGS: Nonobstructive bowel-gas pattern. Surgical clips seen in the pelvis. No acute osseous abnormality. IMPRESSION: Nonobstructive bowel-gas pattern. Electronically Signed   By: Allegra Lai M.D.   On: 11/07/2022 17:57   DG Abd Portable 2V  Result Date: 11/05/2022 CLINICAL DATA:  Abdominal pain EXAM: PORTABLE ABDOMEN - 2 VIEW COMPARISON:  CT 11/02/2022 FINDINGS: No dilated loops of large or small bowel identified. Increasing stool burden identified within the colon. No air-fluid levels identified. Surgical clips noted within the pelvis. Left lung base opacity identified within the imaged portions of the lower chest. Patient has a left chest wall dialysis catheter with tips in the right atrium. Signs of previous CABG procedure. IMPRESSION: 1. Nonobstructive bowel gas pattern. 2. Increasing stool burden within the colon. 3. Left base opacity identified within the imaged portions of the chest. Correlate for any clinical signs/symptoms of pneumonia. Electronically Signed   By: Signa Kell M.D.   On: 11/05/2022 10:27   US Abdomen Limited RUQ (LIVER/GB)  Result Date: 11/03/2022 CLINICAL DATA:  Right upper quadrant pain EXAM: ULTRASOUND ABDOMEN LIMITED RIGHT UPPER QUADRANT COMPARISON:  CT 11/02/2022.  Older exams as  well. FINDINGS: Gallbladder: Gallbladder is mildly  distended. No shadowing stones or adjacent fluid but there is gallbladder wall thickening. Common bile duct: Diameter: 4 mm Liver: No focal lesion identified. Within normal limits in parenchymal echogenicity. Portal vein is patent on color Doppler imaging with normal direction of blood flow towards the liver. Other: None. IMPRESSION: No shadowing stones. The gallbladder wall is thickened which is nonspecific. No adjacent fluid. No reported sonographic Murphy sign. Please correlate with clinical symptomatology. If there is further concern, HIDA scan could be considered as clinically appropriate. No biliary ductal dilatation Electronically Signed   By: Karen Kays M.D.   On: 11/03/2022 14:59   CT ABDOMEN PELVIS WO CONTRAST  Result Date: 11/02/2022 CLINICAL DATA:  Acute abdominal pain after endoscopy EXAM: CT ABDOMEN AND PELVIS WITHOUT CONTRAST TECHNIQUE: Multidetector CT imaging of the abdomen and pelvis was performed following the standard protocol without IV contrast. RADIATION DOSE REDUCTION: This exam was performed according to the departmental dose-optimization program which includes automated exposure control, adjustment of the mA and/or kV according to patient size and/or use of iterative reconstruction technique. COMPARISON:  CT 04/06/2020 without contrast FINDINGS: Lower chest: Small bilateral pleural effusions identified with some adjacent opacities. Atelectasis is favored over infiltrate recommend follow-up. The heart is enlarged. Status post median sternotomy. Coronary artery calcifications are seen. Hepatobiliary: On this non IV contrast exam, the liver has some small calcifications, possibly related to old granulomatous disease. However the gallbladder is distended with some slight wall thickening and stranding. There is also some luminal high density. Sludge or stones are possible. If there is concern further of acute cholecystitis an ultrasound may be of some benefit as the next step in the  workup. Pancreas: Moderate atrophy of the pancreas.  No obvious mass. Spleen: Spleen is nonenlarged. Adrenals/Urinary Tract: The adrenal glands are grossly preserved. There is severe atrophy of the kidneys with several vascular calcifications and tiny cystic areas. The more complex hyperdense area along the upper pole of the right kidney is stable. Previously measuring 2.8 x 2.0 cm and today 2.5 by 1.5 cm. Contracted urinary bladder. Stomach/Bowel: There is some fluid and debris in the stomach. Moderate diffuse scattered colonic stool. Left-sided colonic diverticulosis. Surgical changes along the rectosigmoid colon. No obstruction. Surgical changes as well along loops of bowel in the right lower quadrant. The appendix is not well seen. There are several loops of small bowel in the midabdomen which have air-fluid levels and are mildly dilated measuring up to 3.2 cm. No abrupt caliber change. Vascular/Lymphatic: Extensive vascular calcifications. Normal caliber aorta and IVC. No specific abnormal lymph node enlargement identified in the abdomen and pelvis. Reproductive: Status post hysterectomy. No adnexal masses. Other: Anasarca. No obstruction, free air or free fluid. Mild areas of mesenteric stranding and presacral space stranding. Musculoskeletal: Osteopenia. Scattered degenerative changes. Schmorl's node changes along lumbar spine and thoracic spine. IMPRESSION: Once again severely atrophic kidneys with calcifications and cystic areas including complex area along the upper pole of the right kidney which is stable in size. Recommend continued surveillance. Developing small bilateral pleural effusion with adjacent lung opacities. Atelectasis versus infiltrate. Recommend follow-up. Increasing anasarca mesenteric stranding. There are some mildly dilated loops of small bowel with some air-fluid levels but no abrupt transition. Ileus versus mild partial obstruction is in the differential recommend close follow-up.  Colonic diverticulosis. Distended gallbladder with wall thickening and some stranding. In addition there is some high attenuation material in the gallbladder. Please correlate with any sludge or stones. Please  correlate with any prior contrast administration. Overall recommend further workup with ultrasound to further delineate when able. Electronically Signed   By: Karen Kays M.D.   On: 11/02/2022 14:27   MR BRAIN WO CONTRAST  Result Date: 10/31/2022 CLINICAL DATA:  Altered mental status EXAM: MRI HEAD WITHOUT CONTRAST TECHNIQUE: Multiplanar, multiecho pulse sequences of the brain and surrounding structures were obtained without intravenous contrast. COMPARISON:  04/23/2022 MRI, correlation is also made with 10/30/2022 CT head FINDINGS: Brain: No restricted diffusion to suggest acute or subacute infarct. No acute hemorrhage, mass, or midline shift. No hydrocephalus. 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 Normal pituitary and craniocervical junction. Remote lacunar infarcts in the bilateral basal ganglia and left cerebellum. Punctate foci of hemosiderin deposition in the right cerebellum, right campus, and right frontal and parietal lobes, possibly sequela of prior microhemorrhage. No superficial siderosis. Confluent T2 hyperintense signal in the periventricular white matter, likely the sequela of moderate chronic small vessel ischemic disease. Vascular: Normal arterial flow voids. Skull and upper cervical spine: Normal marrow signal. Sinuses/Orbits: Mucosal thickening in the left sphenoid sinus. No acute finding in the orbits. Other: Trace fluid in the mastoid air cells. IMPRESSION: 1. No acute intracranial process. No evidence of acute or subacute infarct. 2. 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  Electronically Signed   By: Wiliam Ke M.D.   On: 10/31/2022 19:31   EEG adult  Result Date: 10/31/2022 Charlsie Quest, MD     10/31/2022  2:57 PM Patient Name: KYIRA VOLKERT MRN: [id number] Epilepsy Attending: Charlsie Quest Referring Physician/Provider: Rejeana Brock, MD Date:10/31/2022 Duration: 22.39 mins Patient history: 82yo F getting eeg to evaluate for seizure Level of alertness: Awake, asleep AEDs during EEG study: LEV Technical aspects: This EEG study was done with scalp electrodes positioned according to the 10-20 International system of electrode placement. Electrical activity was reviewed with band pass filter of 1-70Hz , sensitivity of 7 uV/mm, display speed of 72mm/sec with a 60Hz  notched filter applied as appropriate. EEG data were recorded continuously and digitally stored.  Video monitoring was available and reviewed as appropriate. Description: The posterior dominant rhythm consists of 8-9 Hz activity of moderate voltage (25-35 uV) seen predominantly in posterior head regions, symmetric and reactive to eye opening and eye closing. Sleep was characterized by vertex waves, sleep spindles (12 to 14 Hz), maximal frontocentral region.  EEG showed intermittent generalized 3 to 6 Hz theta-delta slowing. Hyperventilation and photic stimulation were not performed.   ABNORMALITY - Intermittent slow, generalized IMPRESSION: This study is suggestive of mild diffuse encephalopathy. No seizures or epileptiform discharges were seen throughout the recording. Charlsie Quest   DG Abd 1 View  Result Date: 10/31/2022 CLINICAL DATA:  Abdominal pain EXAM: ABDOMEN - 1 VIEW COMPARISON:  08/29/2022 FINDINGS: Bowel gas pattern is nonspecific. Gas and stool are noted in colon. Small amount of stool is seen in colon. There are surgical staples and clips in lower abdomen and pelvis. There is no identifiable electronic device in abdomen and pelvis in the current study. In the previous examination, an  electronic device, capsule most noted in region of rectum suggesting interval passage. Arterial calcifications are seen. IMPRESSION: Nonspecific bowel gas pattern. There is no evidence of any metallic electronic capsule in  the abdomen and pelvis. Electronically Signed   By: Ernie Avena M.D.   On: 10/31/2022 08:40   CT HEAD CODE STROKE WO CONTRAST  Result Date: 10/30/2022 CLINICAL DATA:  Code stroke.  Seizure, altered mental status. EXAM: CT HEAD WITHOUT CONTRAST TECHNIQUE: Contiguous axial images were obtained from the base of the skull through the vertex without intravenous contrast. RADIATION DOSE REDUCTION: This exam was performed according to the departmental dose-optimization program which includes automated exposure control, adjustment of the mA and/or kV according to patient size and/or use of iterative reconstruction technique. COMPARISON:  CT head 08/26/2022. FINDINGS: Brain: There is no evidence of acute territorial infarct. There are low-density subdural collections overlying both cerebral hemispheres measuring up to 6 mm on the left and 3 mm on the right without evidence of acute blood products, new since the prior head CT from 08/26/2022. There is no mass effect on the underlying brain parenchyma. Parenchymal volume loss with prominence of the ventricular system and extra-axial CSF spaces is stable. The ventricles are unchanged in size and configuration. A remote infarct in the left basal ganglia is unchanged. Additional confluent hypodensity in the supratentorial white matter likely reflects sequela of underlying chronic small-vessel ischemic change. The pituitary and suprasellar region are normal. There is no mass lesion. There is no midline shift. Vascular: There is calcification of the bilateral carotid siphons and vertebral arteries. Skull: Normal. Negative for fracture or focal lesion. Sinuses/Orbits: The paranasal sinuses are clear. The globes and orbits are unremarkable. Other: None.  ASPECTS [facility] Stroke Program Early CT Score) - Ganglionic level infarction (caudate, lentiform nuclei, internal capsule, insula, M1-M3 cortex): 7 - Supraganglionic infarction (M4-M6 cortex): 3 Total score (0-10 with 10 being normal): 10 IMPRESSION: 1. No evidence of acute intracranial hemorrhage or acute territorial infarct. 2. Low-density subdural hygromas overlying both cerebral hemispheres measuring up to 6 mm on the left and 3 mm on the right, new since the ct head from 08/26/2022 but without evidence of acute blood within the collections. These results communicated via Amion at the time of interpretation on 10/30/2022 at 6:44 pm to provider [facility] , who verbally acknowledged these results. Electronically Signed   By: Lesia Hausen M.D.   On: 10/30/2022 18:46   DG Chest Port 1 View  Result Date: 10/28/2022 CLINICAL DATA:  Cough EXAM: PORTABLE CHEST 1 VIEW COMPARISON:  10/23/2022 FINDINGS: Prior CABG. Left dialysis catheter remains in place with the tip in the right atrium. Mild cardiomegaly. No overt edema. Bibasilar atelectasis. No effusions or acute bony abnormality. IMPRESSION: Cardiomegaly, bibasilar atelectasis. Electronically Signed   By: Charlett Nose M.D.   On: 10/28/2022 22:15   CT Chest Wo Contrast  Result Date: 10/23/2022 CLINICAL DATA:  Pneumonia, complication suspected, xray done EXAM: CT CHEST WITHOUT CONTRAST TECHNIQUE: Multidetector CT imaging of the chest was performed following the standard protocol without IV contrast. RADIATION DOSE REDUCTION: This exam was performed according to the departmental dose-optimization program which includes automated exposure control, adjustment of the mA and/or kV according to patient size and/or use of iterative reconstruction technique. COMPARISON:  Radiograph earlier today. Additional prior radiographs reviewed. Most recent chest CT available 07/16/2021 FINDINGS: Cardiovascular: Left-sided dialysis catheter tip is in the atrial caval  junction. Mild cardiomegaly. Post CABG with calcification of native coronary arteries. Decreased density of the blood pool typical of anemia. Advanced aortic atherosclerosis with aortic tortuosity. No aneurysm or periaortic stranding. No pericardial effusion. Mediastinum/Nodes: 14 mm lower anterior paratracheal node, similar to 2023 exam. Scattered  additional shoddy mediastinal lymph nodes are also unchanged. No progressive adenopathy. Patulous esophagus. Lungs/Pleura: Small bilateral pleural effusions, left slightly larger than right. Slight vascular congestion. Minimal fluid in the fissures. Mild patchy ground-glass opacity in the dependent left greater than right lower lobe. Moderate central bronchial thickening which may be congestive or bronchitic. no endobronchial debris. No pulmonary mass. Upper Abdomen: Sequela of chronic renal disease with renal atrophy and cysts. No specific imaging follow-up of these cysts is recommended. A peripherally calcified density in the right upper kidney is stable from 2023 exam and consistent with a complex cyst, also needing no further follow-up. Left colonic diverticulosis. Musculoskeletal: Exaggerated thoracic kyphosis with degenerative change throughout the thoracic spine. Chronic mild L1 compression deformity. Slight increase in Schmorl's node inferior endplate of T11. Prior median sternotomy. There are no acute or suspicious osseous abnormalities. IMPRESSION: 1. Small bilateral pleural effusions, left slightly larger than right. Slight vascular congestion. 2. Mild patchy ground-glass opacity in the dependent left greater than right lower lobe may be atelectasis or pneumonia. 3. Moderate central bronchial thickening may be congestive or bronchitic. Aortic Atherosclerosis (ICD10-I70.0). Electronically Signed   By: Narda Rutherford M.D.   On: 10/23/2022 15:35   DG Chest Port 1 View  Result Date: 10/23/2022 CLINICAL DATA:  82 year old female with cough. Productive cough  for 2 weeks. Some shortness of breath. EXAM: PORTABLE CHEST 1 VIEW COMPARISON:  Chest radiographs 10/02/2022 and earlier. FINDINGS: Portable AP upright view at 1107 hours. Prior CABG. Stable cardiomegaly and mediastinal contours. Calcified aortic atherosclerosis. Stable left chest dual lumen dialysis type catheter. Patchy, nonspecific increased left lung base opacity partially obscuring the diaphragm now. No superimposed pneumothorax or consolidation. Stable pulmonary vascularity without overt edema. No confluent right lung opacity. Stable visualized osseous structures.  Negative visible bowel gas. IMPRESSION: 1. Patchy, nonspecific increased left lung base opacity new since last month. PA and lateral views may be helpful if feasible. 2. Otherwise stable cardiomegaly, pulmonary vascularity, prior CABG, chest dialysis catheter, Aortic Atherosclerosis. Electronically Signed   By: Odessa Fleming M.D.   On: 10/23/2022 11:16    Standley Dakins, MD  [facility]  If 7PM-7AM, please contact night-coverage [email or web address] Password [facility] 11/09/2022, 12:37 PM   LOS: 11 days

## 2022-11-09 NOTE — Progress Notes (Signed)
[name] NEPHR[facility]Y PROGRESS NOTE  Assessment/ Plan: Pt is a 82 y.o. yo female with history of HTN, CAD status post CABG, HLD, A-fib, GI bleed, ESRD on HD presented with generalized weakness/abdomen pain, following to manage ESRD.  # ESRD MWF at [facility]: Plan for HD today per schedule.  L IJ TDC for dialysis.  # ABLA likely GI source, required transfusion.  EGD/enteroscopy during this admission showing nonbleeding gastric ulcer, duodenal polyp status post biopsy.  On octreotide.  GI is following.  Hemoglobin 10.3 stable today.  # Abdomen pain: CT scan showing possible ileus.  Initial plan was CT angio of the abdomen with seems to be on hold at the moment.  Seen by general surgery and providing supportive/conservative treatment.  # Secondary hyperparathyroidism: Currently on calcitriol, Cinacalcet and just resumed Renvela.  Monitor lab.  # HTN/volume: UF with HD and continue current antihypertensives.  Subjective: Seen and examined at bedside.  She is sitting on chair comfortable.  Very pleasant.  Denies any nausea, vomiting, chest pain or shortness of breath.  GI team was presented at the bedside as well. Objective Vital signs in last 24 hours: Vitals:   11/08/22 1547 11/08/22 2055 11/09/22 0304 11/09/22 0750  BP: (!) 175/64 (!) 187/73 (!) 170/65 (!) 185/72  Pulse:  65 (!) 56 64  Resp:  18 16   Temp:  99 F (37.2 C) 98.7 F (37.1 C)   TempSrc:      SpO2:  99% 99%   Weight:      Height:       Weight change:   Intake/Output Summary (Last 24 hours) at 11/09/2022 1135 Last data filed at 11/09/2022 0981 Gross per 24 hour  Intake 1056.48 ml  Output --  Net 1056.48 ml       Labs: RENAL PANEL Recent Labs  Lab 11/03/22 1156 11/04/22 0351 11/05/22 0426 11/07/22 0955  NA 133* 132* 133* 131*  K 3.6 4.4 4.3 4.7  CL 97* 97* 97* 94*  CO2 26 23 25  21*  GLUCOSE 138* 74 83 161*  BUN 24* 35* 25* 53*  CREATININE 4.62* 5.67* 4.36* 7.96*  CALCIUM 7.4* 7.3* 7.6* 8.0*   PHOS  --  5.6* 4.9* 7.7*  ALBUMIN 3.0* 2.7*  2.7* 2.7* 3.0*    Liver Function Tests: Recent Labs  Lab 11/03/22 1156 11/04/22 0351 11/05/22 0426 11/07/22 0955  AST 12* 11*  --   --   ALT 12 11  --   --   ALKPHOS 85 74  --   --   BILITOT 0.2* 0.8  --   --   PROT 5.9* 5.3*  --   --   ALBUMIN 3.0* 2.7*  2.7* 2.7* 3.0*   No results for input(s): "LIPASE", "AMYLASE" in the last 168 hours. No results for input(s): "AMMONIA" in the last 168 hours. CBC: Recent Labs    04/20/22 2335 04/21/22 0636 04/21/22 2200 08/07/22 1148 08/08/22 0859 11/02/22 1643 11/03/22 1156 11/04/22 0351 11/07/22 0955 11/09/22 0351  HGB 6.7* 6.7*   < > 9.0*   < > 10.3* 9.9* 9.2* 9.6* 10.3*  MCV 122.9* 121.6*   < > 107.8*   < >  --  108.9* 107.6* 105.7* 106.3*  VITAMINB12 >7,500*  --   --  1,981*  --   --   --   --   --   --   FOLATE >40.0  --   --  >40.0  --   --   --   --   --   --  FERRITIN  --  912*  --  912*  --   --   --   --   --   --   TIBC  --  255  --  234*  --   --   --   --   --   --   IRON  --  58  --  63  --   --   --   --   --   --   RETICCTPCT 3.8* 3.7*  --  3.0  --   --   --   --   --   --    < > = values in this interval not displayed.    Cardiac Enzymes: No results for input(s): "CKTOTAL", "CKMB", "CKMBINDEX", "TROPONINI" in the last 168 hours. CBG: Recent Labs  Lab 11/02/22 2126 11/03/22 2020  GLUCAP 137* 200*    Iron Studies: No results for input(s): "IRON", "TIBC", "TRANSFERRIN", "FERRITIN" in the last 72 hours. Studies/Results: DG Abd 1 View  Result Date: 11/07/2022 CLINICAL DATA:  Abdominal pain/distention EXAM: ABDOMEN - 1 VIEW COMPARISON:  Abdominal radiograph dated April 27th 2020 FINDINGS: Nonobstructive bowel-gas pattern. Surgical clips seen in the pelvis. No acute osseous abnormality. IMPRESSION: Nonobstructive bowel-gas pattern. Electronically Signed   By: Allegra Lai M.D.   On: 11/07/2022 17:57    Medications: Infusions:  anticoagulant sodium citrate       Scheduled Medications:  amiodarone  100 mg Oral Daily   calcitRIOL  1.75 mcg Oral Q M,W,F-HD   Chlorhexidine Gluconate Cloth  6 each Topical Daily   Chlorhexidine Gluconate Cloth  6 each Topical Q0600   Chlorhexidine Gluconate Cloth  6 each Topical Q0600   cinacalcet  30 mg Oral Q M,W,F-HD   diphenhydrAMINE  50 mg Oral Once   Or   diphenhydrAMINE  50 mg Intravenous Once   gabapentin  100 mg Oral QHS   levETIRAcetam  500 mg Oral Daily   levothyroxine  75 mcg Oral QAC breakfast   lisinopril  20 mg Oral Daily   magic mouthwash  5 mL Oral TID   metoprolol tartrate  12.5 mg Oral BID   octreotide  100 mcg Subcutaneous Q12H   polyethylene glycol  17 g Oral TID   predniSONE  50 mg Oral Q6H   rosuvastatin  10 mg Oral Daily   sevelamer carbonate  800 mg Oral TID WC    have reviewed scheduled and prn medications.  Physical Exam: General:NAD, comfortable Heart:RRR, s1s2 nl Lungs:clear b/l, no crackle Abdomen:soft, Non-tender, non-distended Extremities:No edema Dialysis Access: Left IJ TDC in place.  Babe Anthis Prasad Juel Bellerose 11/09/2022,11:35 AM  LOS: 11 days

## 2022-11-09 NOTE — Procedures (Signed)
Hemodialysis Treatment Note:  Tolerated 3.5 hour treatment as ordered and removed net fluid. Elevated BP pre treatment and during treatment that required PRN medication and one time order (see eMAR). BP improved after administration of Hydralazine 10mg  IV. Patient remained asymptomatic throughout treatment. Post treatment, vital signs stable and within acceptable limits. Patient complains of headache 5/10 post treatment, but denies any other complaints. Lungs clear and no edema noted. Report given to primary RN. Transported back to room by this RN.

## 2022-11-09 NOTE — Progress Notes (Signed)
Subjective: Patient states she I feeling good today. Ate breakfast this morning, no abdominal pain after and none at this time. Alert and oriented x4.   Objective: Vital signs in last 24 hours: Temp:  [98.3 F (36.8 C)-99 F (37.2 C)] 98.7 F (37.1 C) (05/01 0304) Pulse Rate:  [56-65] 64 (05/01 0750) Resp:  [16-18] 16 (05/01 0304) BP: (170-187)/(58-73) 185/72 (05/01 0750) SpO2:  [97 %-99 %] 99 % (05/01 0304) Last BM Date : 11/07/22 General:   Alert and oriented, pleasant Head:  Normocephalic and atraumatic. Eyes:  No icterus, sclera clear. Conjuctiva pink.  Mouth:  Without lesions, mucosa pink and moist.  Heart:  S1, S2 present, no murmurs noted.  Lungs: Clear to auscultation bilaterally, without wheezing, rales, or rhonchi.  Abdomen:  Bowel sounds present, soft, non-tender, non-distended. No HSM or hernias noted. No rebound or guarding. No masses appreciated  Msk:  Symmetrical without gross deformities. Normal posture. Pulses:  Normal pulses noted. Extremities:  Without clubbing or edema. Neurologic:  Alert and  oriented x4;  grossly normal neurologically. Skin:  Warm and dry, intact without significant lesions.  Psych:  Alert and cooperative. Normal mood and affect.  Intake/Output from previous day: 04/30 0701 - 05/01 0700 In: 1296.5 [P.O.:840; IV Piggyback:456.5] Out: -  Intake/Output this shift: No intake/output data recorded.  Lab Results: Recent Labs    11/07/22 0955 11/09/22 0351  WBC 7.5 9.1  HGB 9.6* 10.3*  HCT 27.9* 30.6*  PLT 200 220   BMET Recent Labs    11/07/22 0955  NA 131*  K 4.7  CL 94*  CO2 21*  GLUCOSE 161*  BUN 53*  CREATININE 7.96*  CALCIUM 8.0*   LFT Recent Labs    11/07/22 0955  ALBUMIN 3.0*   Studies/Results: DG Abd 1 View  Result Date: 11/07/2022 CLINICAL DATA:  Abdominal pain/distention EXAM: ABDOMEN - 1 VIEW COMPARISON:  Abdominal radiograph dated April 27th 2020 FINDINGS: Nonobstructive bowel-gas pattern. Surgical clips  seen in the pelvis. No acute osseous abnormality. IMPRESSION: Nonobstructive bowel-gas pattern. Electronically Signed   By: Allegra Lai M.D.   On: 11/07/2022 17:57    Assessment: [name] is an 82 y.o. female with a history of dementia, ESRD on HD, A-fib on ASA, CAD, CHF, small bowel AVMs, GERD, HTN, colon cancer s/p resection in 1992, duodenal adenoma who presented to the hospital with weakness and fatigue following dialysis.   Anemia: Hgb 7.1 on admission. 10.3 today s/p 1 unit PRBCs. EGD/enteroscopy this admission showing non-bleeding gastric ulcer s/p biopsy, jejunal polyp s/p biopsy, non-bleeding jejunal diverticula. Known AVMs on capsule in the past. Clinically without overt GI bleeding and stabilized from this standpoint. Maintained on octreotide which will continue outpatient.    Abdominal Pain:  CT on 4/24 with possible ileus, distended gallbladder. [place] w/non-specific gallbladder wall thickening. No gallstones. Gen Surgery saw patient and felt likely not dealing with biliary colic. Has MiraLAX TID ordered but has been refusing doses, She did take morning dose yesterday.  1 documented BM Monday. DG abdomen Monday  w/nonobstructive bowel gas pattern. Reports some improvement in pain after a BM though history is not consistent.  concern for chronic mesenteric ischemia give post prandial pain, CTA ordered for 5/1, though IV access has been lost, staff is attempting to obtain access so that she can proceed with her scan this morning. She denies abdominal pain, tolerated breakfast this morning without issue.  It has been considered that octreotide could be contributing to her abdominal pain, however,  family is okay continuing this at this time as the medication has kept her IDA controlled.   Plan: Proceed with CTA if IV access obtained  Trend h&h, monitor for overt GI bleeding Continue miralax TID Continue octreotide injections (consider LAR injection as outpatient) Bentyl 10mg  q12h     LOS: 11 days    11/09/2022, 8:57 AM  Benoit Meech L. Jeanmarie Hubert, MSN, APRN, AGNP-C Adult-Gerontology Nurse Practitioner [facility]

## 2022-11-09 NOTE — Progress Notes (Signed)
Ultrasound IV attempted twice without success. Prednisone given as ordered. Pt continued to complain of abdominal pain. Pt had a small bm this a.m.

## 2022-11-09 NOTE — Progress Notes (Signed)
Patient lost IV access and the dual-lumen dialysis catheter is not rated 4-hour injection.  Given all this and her history of an allergy to IV contrast, I think the risks of doing a CT angio outweigh the benefits at the present time as she does not have evidence of bowel ischemia.  This was discussed with Dr. [name].  Patient may be a candidate for ultrasound evaluation of her mesenteric vessels.

## 2022-11-09 NOTE — Progress Notes (Signed)
Mobility Specialist Progress Note:   11/09/22 0930  Mobility  Activity Transferred from bed to chair  Level of Assistance Minimal assist, patient does 75% or more  Assistive Device Front wheel walker  Distance Ambulated (ft) 3 ft  Activity Response Tolerated well  Mobility Referral Yes  $Mobility charge 1 Mobility   Pt agreeable to mobility session. Tolerated transfer well with RW, asx throughout. Left pt in chair, all needs met, call bell in reach, alarm on.   Feliciana Rossetti Mobility Specialist Please contact via Special educational needs teacher or  Rehab office at [phone]

## 2022-11-10 DIAGNOSIS — R1084 Generalized abdominal pain: Secondary | ICD-10-CM | POA: Diagnosis not present

## 2022-11-10 DIAGNOSIS — D649 Anemia, unspecified: Secondary | ICD-10-CM | POA: Diagnosis not present

## 2022-11-10 LAB — GLUCOSE, CAPILLARY: Glucose-Capillary: 165 mg/dL — ABNORMAL HIGH (ref 70–99)

## 2022-11-10 MED ORDER — CHLORHEXIDINE GLUCONATE CLOTH 2 % EX PADS
6.0000 | MEDICATED_PAD | Freq: Every day | CUTANEOUS | Status: DC
  Administered 2022-11-10 – 2022-11-11 (×2): 6 via TOPICAL

## 2022-11-10 NOTE — Progress Notes (Signed)
PROGRESS NOTE    Shawna [name]  ZOX:[id number] DOB: Aug 28, 1940 DOA: 10/28/2022  PCP: [facility]   Brief Narrative:  This 82 year old female with PMH significant of ESRD (MWF), Coronary artery disease status post CABG, CHF, hypertension, hyperlipidemia, paroxysmal atrial fibrillation, hyperlipidemia, GI bleed presented in the ED with generalized weakness and not feeling well after dialysis.  Patient was found to have hemoglobin 7.1 on arrival,  She was given 2 units of PRBC.  GI was consulted and was admitted for further evaluation. 10/30/22: Patient was found to be altered mental status and was noted to have seizure-like activity.  Patient was minimally responsive to painful stimuli.  The shaking activity lasted 3 minutes.  Code stroke was activated.  CT head negative for hemorrhage.  Patient was seen by tele-neurologist,  CTA head and neck was recommended but family refused secondary to patient's contrast allergy.  Patient was loaded with Keppra, EEG was completed.  No evidence of seizures.  Patient subsequently improved and now following commands.  The patient had recent hospitalization from 08/26/22 to 08/29/22 secondary to acute on chronic blood loss anemia.  Hemoccult was positive at that time.  The patmultiple gastric AVMs treated with APC, small hiatal hernia, no active or stigmata of bleeding.  Capsule was placed placed during EGD and showed few small AVMs in the small bowel, likely jejunum.  2 small polyps in the distal small bowel previously noted. Capsule appears to sit at the ileocolonic anastomosis for the last 2 to 3 hours of study.  She was transfused 2 units PRBC during that hospital admission.  She is not felt to be a candidate for anticoagulation due to her recurrent GI bleeds.  The patient was noted to have hospital delirium and felt to have a degree of underlying cognitive impairment.    Assessment & Plan:   Principal Problem:   Symptomatic anemia Active Problems:    Acute on chronic blood loss anemia   Acute metabolic encephalopathy   Constipation   Abdominal wall pain in left flank   Essential hypertension   End-stage renal disease on hemodialysis (HCC)   Intestinal ischemia (HCC)   CAD (coronary artery disease)   Diabetes mellitus type 2 in nonobese [facility])   Acquired hypothyroidism   Atrial fibrillation, chronic (HCC)   Anemia of chronic disease   GI bleed   Ileus (HCC)   Periumbilical abdominal pain  Acute on chronic blood loss anemia: Patient presented with hemoglobin 7.1. Status post 1 unit PRBC.  Hemoglobin remained stable afterwards. GI consulted.  Blood loss felt to be secondary to the patient's gastric and small bowel AVMs 11/01/22: SB enteroscopy--nonbleeding gastric ulcer with clean base; non bleeding jejunal diverticula; no AVMs She is treated with BID octreotide injections. Hb remains largely stable around  10.0 GI recommended octreotide 100 mcg twice daily as an outpatient.   Seizure-like activity/myoclonus: Patient has had generalized seizures like activity.  Patient has had similar episodes in the past. It was felt secondary to anemia and gabapentin in the setting of ESRD. Appreciate Neurology consult>>loaded keppra Continue Keppra per neurologist - no further episodes on keppra.   Generalized Abdominal Pain: CT abd/pelvis - suggesting mild ileus and cholelithiasis  [place] RUQ completed for further gallbladder work up - HIDA scan not recommended by surgical team She is tolerating diet but endorses post-prandial pain - improved after enema given 4/27 Laxatives added for constipation / mild ileus  There was concern for underlying mesenteric Ischemia , poor candidate for surgical intervention,  discussed with surgeon, will ask IR to evaluate if she could be a candidate for palliative stenting, Patient would need CTA study to further evaluate for mesenteric ischemia if pain returns and would likely need to be premedicated and coordinated  with dialysis.  GI team planning for CTA on Wed 5/1 with premedication (given history of contrast allergy)  but due to loss of IV access CTA canceled 5/1. GI considering [place] study of mesenteric arteries.  For constipation MOM enema ordered 4/27 and miralax TID ordered  GI team intensified bowel regimen  Dr. Levon Hedger spoke with patient and family and they want to continue at this time.  Abdominal pain has resolved.  Continue octreotide 100 mcg twice daily as an outpatient. Obtain mesenteric Doppler if available if not could be done as an outpatient.   ESRD: Continue HD on Monday, Wednesday, Friday Last hemodialysis on 5/1 Appreciate nephrology follow-up  Paroxysmal Atrial fibrillation/SVT: Not a candidate for anticoagulation secondary to recurrent GI bleed Continue amiodarone and metoprolol  Currently in sinus, but has intermittent episodes of SVT   Essential hypertension Continue home medications-metoprolol and lisinopril Well-documented that the patient has had uncontrolled hypertension improved with HD volume removal She was given extra dose of metoprolol 25 mg 4/29 after HD due to HTN and tachycardia.   Coronary Artery Disease s/p CABG in 2013 with DES to D1 in 10/2016 with cath showing patent LIMA-LAD, SVG-RI, SVG-Mrg and SVG-RCA and repeat cath in 07/2018 showing patent grafts with occlusion of D1 at prior stent site and progression of PDA disease and medical management was recommended Continue imdur, ASA 81 mg, simavastatin   Hypothyroidism Continue levothyroxine     Cognitive impairment Documented over the last several hospitalization the patient has had baseline cognitive impairment and hospital delirium She is at high risk for recurrent delirium due to her metabolic derangement and hospital stays 10/30/2022 CT brain negative. Continue delirium precautions and frequent family visitation  .     DVT prophylaxis: SCDs Code Status: Full code Family Communication: No family  at bed side. Disposition Plan:   Status is: Inpatient Remains inpatient appropriate because:    DC held due to ongoing abd pain, requiring further work up with CTA, GI considering getting an [place] study of mesenteric arteries, if no further testing planned, anticipate Can Discharge home in 1-2 days.    Consultants:  Gastroenterology Nephrology Neurology General surgery  Procedures:    Antimicrobials: Anti-infectives (From admission, onward)    Start     Dose/Rate Route Frequency Ordered Stop   11/03/22 1400  piperacillin-tazobactam (ZOSYN) 2.25 g in sodium chloride 0.9 % 50 mL IVPB  Status:  Discontinued        2.25 g 100 mL/hr over 30 Minutes Intravenous Every 8 hours 11/03/22 1025 11/08/22 1544   11/02/22 2200  piperacillin-tazobactam (ZOSYN) IVPB 2.25 g  Status:  Discontinued        2.25 g 100 mL/hr over 30 Minutes Intravenous Every 8 hours 11/02/22 1826 11/03/22 1026       Subjective: Patient was seen and examined at bedside.  Overnight events noted.   Patient reports doing much better, abdominal pain has resolved.  She feels better and asks when she can be discharged.  Objective: Vitals:   11/09/22 2325 11/10/22 0407 11/10/22 0801 11/10/22 1045  BP: (!) 162/81 127/68  104/73  Pulse: 81  (!) 105 (!) 104  Resp: 18 19    Temp: 97.8 F (36.6 C) 98.5 F (36.9 C)    TempSrc:  SpO2: 100% 100%  98%  Weight:      Height:        Intake/Output Summary (Last 24 hours) at 11/10/2022 1147 Last data filed at 11/10/2022 0555 Gross per 24 hour  Intake 240 ml  Output 2000 ml  Net -1760 ml   Filed Weights   11/07/22 1430 11/09/22 1840 11/09/22 2253  Weight: 57 kg 60 kg 58 kg    Examination:  General exam: Appears calm and comfortable, not in any acute distress. Respiratory system: Clear to auscultation. Respiratory effort normal.  RR 16 Cardiovascular system: S1 & S2 heard, RRR. No JVD, murmurs, rubs, gallops or clicks.  Gastrointestinal system: Abdomen is, non  tender, non distended, BS+ Central nervous system: Alert and oriented X 3. No focal neurological deficits. Extremities: No edema , No cyanosis, no clubbing. Skin: No rashes, lesions or ulcers Psychiatry: Judgement and insight appear normal. Mood & affect appropriate.     Data Reviewed: I have personally reviewed following labs and imaging studies  CBC: Recent Labs  Lab 11/03/22 1156 11/04/22 0351 11/07/22 0955 11/09/22 0351  WBC 5.4 5.6 7.5 9.1  HGB 9.9* 9.2* 9.6* 10.3*  HCT 30.5* 28.2* 27.9* 30.6*  MCV 108.9* 107.6* 105.7* 106.3*  PLT 180 183 200 220   Basic Metabolic Panel: Recent Labs  Lab 11/03/22 1156 11/04/22 0351 11/05/22 0426 11/07/22 0955 11/09/22 1947  NA 133* 132* 133* 131* 129*  K 3.6 4.4 4.3 4.7 4.1  CL 97* 97* 97* 94* 90*  CO2 26 23 25  21* 23  GLUCOSE 138* 74 83 161* 198*  BUN 24* 35* 25* 53* 60*  CREATININE 4.62* 5.67* 4.36* 7.96* 7.55*  CALCIUM 7.4* 7.3* 7.6* 8.0* 8.4*  PHOS  --  5.6* 4.9* 7.7* 6.9*   GFR: Estimated Creatinine Clearance: 5.2 mL/min (A) (by C-G formula based on SCr of 7.55 mg/dL (H)). Liver Function Tests: Recent Labs  Lab 11/03/22 1156 11/04/22 0351 11/05/22 0426 11/07/22 0955 11/09/22 1947  AST 12* 11*  --   --   --   ALT 12 11  --   --   --   ALKPHOS 85 74  --   --   --   BILITOT 0.2* 0.8  --   --   --   PROT 5.9* 5.3*  --   --   --   ALBUMIN 3.0* 2.7*  2.7* 2.7* 3.0* 3.1*   No results for input(s): "LIPASE", "AMYLASE" in the last 168 hours. No results for input(s): "AMMONIA" in the last 168 hours. Coagulation Profile: No results for input(s): "INR", "PROTIME" in the last 168 hours. Cardiac Enzymes: No results for input(s): "CKTOTAL", "CKMB", "CKMBINDEX", "TROPONINI" in the last 168 hours. BNP (last 3 results) No results for input(s): "PROBNP" in the last 8760 hours. HbA1C: No results for input(s): "HGBA1C" in the last 72 hours. CBG: Recent Labs  Lab 11/03/22 2020  GLUCAP 200*   Lipid Profile: No results  for input(s): "CHOL", "HDL", "LDLCALC", "TRIG", "CHOLHDL", "LDLDIRECT" in the last 72 hours. Thyroid Function Tests: No results for input(s): "TSH", "T4TOTAL", "FREET4", "T3FREE", "THYROIDAB" in the last 72 hours. Anemia Panel: No results for input(s): "VITAMINB12", "FOLATE", "FERRITIN", "TIBC", "IRON", "RETICCTPCT" in the last 72 hours. Sepsis Labs: No results for input(s): "PROCALCITON", "LATICACIDVEN" in the last 168 hours.  Recent Results (from the past 240 hour(s))  Culture, blood (Routine X 2) w Reflex to ID Panel     Status: None   Collection Time: 11/02/22  7:24 PM   Specimen:  BLOOD  Result Value Ref Range Status   Specimen Description BLOOD BLOOD RIGHT HAND  Final   Special Requests   Final    BOTTLES DRAWN AEROBIC AND ANAEROBIC Blood Culture adequate volume   Culture   Final    NO GROWTH 5 DAYS Performed at [facility], [name], [place], [place] [address]    Report Status 11/07/2022 FINAL  Final  Culture, blood (Routine X 2) w Reflex to ID Panel     Status: None   Collection Time: 11/02/22  7:32 PM   Specimen: BLOOD  Result Value Ref Range Status   Specimen Description BLOOD BLOOD RIGHT ARM  Final   Special Requests   Final    BOTTLES DRAWN AEROBIC AND ANAEROBIC Blood Culture adequate volume   Culture   Final    NO GROWTH 5 DAYS Performed at [facility], [address] [place], [place] [address]    Report Status 11/07/2022 FINAL  Final    Radiology Studies: No results found.  Scheduled Meds:  amiodarone  100 mg Oral Daily   calcitRIOL  1.75 mcg Oral Q M,W,F-HD   Chlorhexidine Gluconate Cloth  6 each Topical Daily   Chlorhexidine Gluconate Cloth  6 each Topical Q0600   Chlorhexidine Gluconate Cloth  6 each Topical Q0600   Chlorhexidine Gluconate Cloth  6 each Topical Q0600   cinacalcet  30 mg Oral Q M,W,F-HD   diphenhydrAMINE  50 mg Oral Once   Or   diphenhydrAMINE  50 mg Intravenous Once   gabapentin  100 mg Oral QHS   levETIRAcetam  500 mg Oral  Daily   levothyroxine  75 mcg Oral QAC breakfast   lisinopril  20 mg Oral Daily   magic mouthwash  5 mL Oral TID   metoprolol tartrate  12.5 mg Oral BID   octreotide  100 mcg Subcutaneous Q12H   polyethylene glycol  17 g Oral TID   rosuvastatin  10 mg Oral Daily   sevelamer carbonate  800 mg Oral TID WC   Continuous Infusions:  anticoagulant sodium citrate       LOS: 12 days    Time spent: 50 mins    Willeen Niece, MD [facility]   If 7PM-7AM, please contact night-coverage

## 2022-11-10 NOTE — Progress Notes (Addendum)
Gastroenterology Progress Note   Referring Provider: No ref. provider found Primary Care Physician:  [facility] Patient ID: Shawna [name]; [id number]; 07-Aug-1940   Subjective:    Patient states she is feeling good. Ate all of her breakfast. No abdominal pain. Slept well.   Objective:   Vital signs in last 24 hours: Temp:  [97.6 F (36.4 C)-98.5 F (36.9 C)] 98.5 F (36.9 C) (05/02 0407) Pulse Rate:  [50-105] 105 (05/02 0801) Resp:  [16-22] 19 (05/02 0407) BP: (127-217)/(55-95) 127/68 (05/02 0407) SpO2:  [99 %-100 %] 100 % (05/02 0407) Weight:  [58 kg-60 kg] 58 kg (05/01 2253) Last BM Date : 11/09/22 General:   Alert,  Well-developed, well-nourished, pleasant and cooperative in NAD Head:  Normocephalic and atraumatic. Eyes:  Sclera clear, no icterus.   Abdomen:  Soft, nontender and nondistended. Normal bowel sounds, without guarding, and without rebound.   Extremities:  Without clubbing, deformity or edema. Neurologic:  Alert and  oriented x4;  grossly normal neurologically. Skin:  Intact without significant lesions or rashes. Psych:  Alert and cooperative. Normal mood and affect.  Intake/Output from previous day: 05/01 0701 - 05/02 0700 In: 240 [P.O.:240] Out: 2000  Intake/Output this shift: No intake/output data recorded.  Lab Results: CBC Recent Labs    11/07/22 0955 11/09/22 0351  WBC 7.5 9.1  HGB 9.6* 10.3*  HCT 27.9* 30.6*  MCV 105.7* 106.3*  PLT 200 220   BMET Recent Labs    11/07/22 0955 11/09/22 1947  NA 131* 129*  K 4.7 4.1  CL 94* 90*  CO2 21* 23  GLUCOSE 161* 198*  BUN 53* 60*  CREATININE 7.96* 7.55*  CALCIUM 8.0* 8.4*   LFTs Recent Labs    11/07/22 0955 11/09/22 1947  ALBUMIN 3.0* 3.1*   No results for input(s): "LIPASE" in the last 72 hours. PT/INR No results for input(s): "LABPROT", "INR" in the last 72 hours.       Imaging Studies: DG Abd 1 View  Result Date: 11/07/2022 CLINICAL DATA:  Abdominal  pain/distention EXAM: ABDOMEN - 1 VIEW COMPARISON:  Abdominal radiograph dated April 27th 2020 FINDINGS: Nonobstructive bowel-gas pattern. Surgical clips seen in the pelvis. No acute osseous abnormality. IMPRESSION: Nonobstructive bowel-gas pattern. Electronically Signed   By: Allegra Lai M.D.   On: 11/07/2022 17:57   DG Abd Portable 2V  Result Date: 11/05/2022 CLINICAL DATA:  Abdominal pain EXAM: PORTABLE ABDOMEN - 2 VIEW COMPARISON:  CT 11/02/2022 FINDINGS: No dilated loops of large or small bowel identified. Increasing stool burden identified within the colon. No air-fluid levels identified. Surgical clips noted within the pelvis. Left lung base opacity identified within the imaged portions of the lower chest. Patient has a left chest wall dialysis catheter with tips in the right atrium. Signs of previous CABG procedure. IMPRESSION: 1. Nonobstructive bowel gas pattern. 2. Increasing stool burden within the colon. 3. Left base opacity identified within the imaged portions of the chest. Correlate for any clinical signs/symptoms of pneumonia. Electronically Signed   By: Signa Kell M.D.   On: 11/05/2022 10:27   US Abdomen Limited RUQ (LIVER/GB)  Result Date: 11/03/2022 CLINICAL DATA:  Right upper quadrant pain EXAM: ULTRASOUND ABDOMEN LIMITED RIGHT UPPER QUADRANT COMPARISON:  CT 11/02/2022.  Older exams as well. FINDINGS: Gallbladder: Gallbladder is mildly distended. No shadowing stones or adjacent fluid but there is gallbladder wall thickening. Common bile duct: Diameter: 4 mm Liver: No focal lesion identified. Within normal limits in parenchymal echogenicity. Portal vein is  patent on color Doppler imaging with normal direction of blood flow towards the liver. Other: None. IMPRESSION: No shadowing stones. The gallbladder wall is thickened which is nonspecific. No adjacent fluid. No reported sonographic Murphy sign. Please correlate with clinical symptomatology. If there is further concern, HIDA scan  could be considered as clinically appropriate. No biliary ductal dilatation Electronically Signed   By: Karen Kays M.D.   On: 11/03/2022 14:59   CT ABDOMEN PELVIS WO CONTRAST  Result Date: 11/02/2022 CLINICAL DATA:  Acute abdominal pain after endoscopy EXAM: CT ABDOMEN AND PELVIS WITHOUT CONTRAST TECHNIQUE: Multidetector CT imaging of the abdomen and pelvis was performed following the standard protocol without IV contrast. RADIATION DOSE REDUCTION: This exam was performed according to the departmental dose-optimization program which includes automated exposure control, adjustment of the mA and/or kV according to patient size and/or use of iterative reconstruction technique. COMPARISON:  CT 04/06/2020 without contrast FINDINGS: Lower chest: Small bilateral pleural effusions identified with some adjacent opacities. Atelectasis is favored over infiltrate recommend follow-up. The heart is enlarged. Status post median sternotomy. Coronary artery calcifications are seen. Hepatobiliary: On this non IV contrast exam, the liver has some small calcifications, possibly related to old granulomatous disease. However the gallbladder is distended with some slight wall thickening and stranding. There is also some luminal high density. Sludge or stones are possible. If there is concern further of acute cholecystitis an ultrasound may be of some benefit as the next step in the workup. Pancreas: Moderate atrophy of the pancreas.  No obvious mass. Spleen: Spleen is nonenlarged. Adrenals/Urinary Tract: The adrenal glands are grossly preserved. There is severe atrophy of the kidneys with several vascular calcifications and tiny cystic areas. The more complex hyperdense area along the upper pole of the right kidney is stable. Previously measuring 2.8 x 2.0 cm and today 2.5 by 1.5 cm. Contracted urinary bladder. Stomach/Bowel: There is some fluid and debris in the stomach. Moderate diffuse scattered colonic stool. Left-sided colonic  diverticulosis. Surgical changes along the rectosigmoid colon. No obstruction. Surgical changes as well along loops of bowel in the right lower quadrant. The appendix is not well seen. There are several loops of small bowel in the midabdomen which have air-fluid levels and are mildly dilated measuring up to 3.2 cm. No abrupt caliber change. Vascular/Lymphatic: Extensive vascular calcifications. Normal caliber aorta and IVC. No specific abnormal lymph node enlargement identified in the abdomen and pelvis. Reproductive: Status post hysterectomy. No adnexal masses. Other: Anasarca. No obstruction, free air or free fluid. Mild areas of mesenteric stranding and presacral space stranding. Musculoskeletal: Osteopenia. Scattered degenerative changes. Schmorl's node changes along lumbar spine and thoracic spine. IMPRESSION: Once again severely atrophic kidneys with calcifications and cystic areas including complex area along the upper pole of the right kidney which is stable in size. Recommend continued surveillance. Developing small bilateral pleural effusion with adjacent lung opacities. Atelectasis versus infiltrate. Recommend follow-up. Increasing anasarca mesenteric stranding. There are some mildly dilated loops of small bowel with some air-fluid levels but no abrupt transition. Ileus versus mild partial obstruction is in the differential recommend close follow-up. Colonic diverticulosis. Distended gallbladder with wall thickening and some stranding. In addition there is some high attenuation material in the gallbladder. Please correlate with any sludge or stones. Please correlate with any prior contrast administration. Overall recommend further workup with ultrasound to further delineate when able. Electronically Signed   By: Karen Kays M.D.   On: 11/02/2022 14:27   MR BRAIN WO CONTRAST  Result Date:  10/31/2022 CLINICAL DATA:  Altered mental status EXAM: MRI HEAD WITHOUT CONTRAST TECHNIQUE: Multiplanar, multiecho  pulse sequences of the brain and surrounding structures were obtained without intravenous contrast. COMPARISON:  04/23/2022 MRI, correlation is also made with 10/30/2022 CT head FINDINGS: Brain: No restricted diffusion to suggest acute or subacute infarct. No acute hemorrhage, mass, or midline shift. No hydrocephalus. 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 Normal pituitary and craniocervical junction. Remote lacunar infarcts in the bilateral basal ganglia and left cerebellum. Punctate foci of hemosiderin deposition in the right cerebellum, right campus, and right frontal and parietal lobes, possibly sequela of prior microhemorrhage. No superficial siderosis. Confluent T2 hyperintense signal in the periventricular white matter, likely the sequela of moderate chronic small vessel ischemic disease. Vascular: Normal arterial flow voids. Skull and upper cervical spine: Normal marrow signal. Sinuses/Orbits: Mucosal thickening in the left sphenoid sinus. No acute finding in the orbits. Other: Trace fluid in the mastoid air cells. IMPRESSION: 1. No acute intracranial process. No evidence of acute or subacute infarct. 2. 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 Electronically Signed   By: Wiliam Ke M.D.   On: 10/31/2022 19:31   EEG adult  Result Date: 10/31/2022 Charlsie Quest, MD     10/31/2022  2:57 PM Patient Name: MAKHIYA COBURN MRN: [id number] Epilepsy Attending: Charlsie Quest Referring Physician/Provider: Rejeana Brock, MD Date:10/31/2022 Duration: 22.39 mins Patient history: 82yo F getting eeg to evaluate for seizure Level of alertness: Awake, asleep AEDs during EEG study: LEV Technical aspects: This EEG study was done with scalp electrodes positioned according to the 10-20 International system of  electrode placement. Electrical activity was reviewed with band pass filter of 1-70Hz , sensitivity of 7 uV/mm, display speed of 38mm/sec with a 60Hz  notched filter applied as appropriate. EEG data were recorded continuously and digitally stored.  Video monitoring was available and reviewed as appropriate. Description: The posterior dominant rhythm consists of 8-9 Hz activity of moderate voltage (25-35 uV) seen predominantly in posterior head regions, symmetric and reactive to eye opening and eye closing. Sleep was characterized by vertex waves, sleep spindles (12 to 14 Hz), maximal frontocentral region.  EEG showed intermittent generalized 3 to 6 Hz theta-delta slowing. Hyperventilation and photic stimulation were not performed.   ABNORMALITY - Intermittent slow, generalized IMPRESSION: This study is suggestive of mild diffuse encephalopathy. No seizures or epileptiform discharges were seen throughout the recording. Charlsie Quest   DG Abd 1 View  Result Date: 10/31/2022 CLINICAL DATA:  Abdominal pain EXAM: ABDOMEN - 1 VIEW COMPARISON:  08/29/2022 FINDINGS: Bowel gas pattern is nonspecific. Gas and stool are noted in colon. Small amount of stool is seen in colon. There are surgical staples and clips in lower abdomen and pelvis. There is no identifiable electronic device in abdomen and pelvis in the current study. In the previous examination, an electronic device, capsule most noted in region of rectum suggesting interval passage. Arterial calcifications are seen. IMPRESSION: Nonspecific bowel gas pattern. There is no evidence of any metallic electronic capsule in the abdomen and pelvis. Electronically Signed   By: Ernie Avena M.D.   On: 10/31/2022 08:40   CT HEAD CODE STROKE WO CONTRAST  Result Date: 10/30/2022 CLINICAL DATA:  Code stroke.  Seizure, altered mental status.  EXAM: CT HEAD WITHOUT CONTRAST TECHNIQUE: Contiguous axial images were obtained from the base of the skull through the vertex  without intravenous contrast. RADIATION DOSE REDUCTION: This exam was performed according to the departmental dose-optimization program which includes automated exposure control, adjustment of the mA and/or kV according to patient size and/or use of iterative reconstruction technique. COMPARISON:  CT head 08/26/2022. FINDINGS: Brain: There is no evidence of acute territorial infarct. There are low-density subdural collections overlying both cerebral hemispheres measuring up to 6 mm on the left and 3 mm on the right without evidence of acute blood products, new since the prior head CT from 08/26/2022. There is no mass effect on the underlying brain parenchyma. Parenchymal volume loss with prominence of the ventricular system and extra-axial CSF spaces is stable. The ventricles are unchanged in size and configuration. A remote infarct in the left basal ganglia is unchanged. Additional confluent hypodensity in the supratentorial white matter likely reflects sequela of underlying chronic small-vessel ischemic change. The pituitary and suprasellar region are normal. There is no mass lesion. There is no midline shift. Vascular: There is calcification of the bilateral carotid siphons and vertebral arteries. Skull: Normal. Negative for fracture or focal lesion. Sinuses/Orbits: The paranasal sinuses are clear. The globes and orbits are unremarkable. Other: None. ASPECTS [facility] Stroke Program Early CT Score) - Ganglionic level infarction (caudate, lentiform nuclei, internal capsule, insula, M1-M3 cortex): 7 - Supraganglionic infarction (M4-M6 cortex): 3 Total score (0-10 with 10 being normal): 10 IMPRESSION: 1. No evidence of acute intracranial hemorrhage or acute territorial infarct. 2. Low-density subdural hygromas overlying both cerebral hemispheres measuring up to 6 mm on the left and 3 mm on the right, new since the ct head from 08/26/2022 but without evidence of acute blood within the collections. These results  communicated via Amion at the time of interpretation on 10/30/2022 at 6:44 pm to provider [facility] , who verbally acknowledged these results. Electronically Signed   By: Lesia Hausen M.D.   On: 10/30/2022 18:46   DG Chest Port 1 View  Result Date: 10/28/2022 CLINICAL DATA:  Cough EXAM: PORTABLE CHEST 1 VIEW COMPARISON:  10/23/2022 FINDINGS: Prior CABG. Left dialysis catheter remains in place with the tip in the right atrium. Mild cardiomegaly. No overt edema. Bibasilar atelectasis. No effusions or acute bony abnormality. IMPRESSION: Cardiomegaly, bibasilar atelectasis. Electronically Signed   By: Charlett Nose M.D.   On: 10/28/2022 22:15   CT Chest Wo Contrast  Result Date: 10/23/2022 CLINICAL DATA:  Pneumonia, complication suspected, xray done EXAM: CT CHEST WITHOUT CONTRAST TECHNIQUE: Multidetector CT imaging of the chest was performed following the standard protocol without IV contrast. RADIATION DOSE REDUCTION: This exam was performed according to the departmental dose-optimization program which includes automated exposure control, adjustment of the mA and/or kV according to patient size and/or use of iterative reconstruction technique. COMPARISON:  Radiograph earlier today. Additional prior radiographs reviewed. Most recent chest CT available 07/16/2021 FINDINGS: Cardiovascular: Left-sided dialysis catheter tip is in the atrial caval junction. Mild cardiomegaly. Post CABG with calcification of native coronary arteries. Decreased density of the blood pool typical of anemia. Advanced aortic atherosclerosis with aortic tortuosity. No aneurysm or periaortic stranding. No pericardial effusion. Mediastinum/Nodes: 14 mm lower anterior paratracheal node, similar to 2023 exam. Scattered additional shoddy mediastinal lymph nodes are also unchanged. No progressive adenopathy. Patulous esophagus. Lungs/Pleura: Small bilateral pleural effusions, left slightly larger than right. Slight vascular congestion.  Minimal fluid in the fissures. Mild patchy ground-glass opacity in the dependent left  greater than right lower lobe. Moderate central bronchial thickening which may be congestive or bronchitic. no endobronchial debris. No pulmonary mass. Upper Abdomen: Sequela of chronic renal disease with renal atrophy and cysts. No specific imaging follow-up of these cysts is recommended. A peripherally calcified density in the right upper kidney is stable from 2023 exam and consistent with a complex cyst, also needing no further follow-up. Left colonic diverticulosis. Musculoskeletal: Exaggerated thoracic kyphosis with degenerative change throughout the thoracic spine. Chronic mild L1 compression deformity. Slight increase in Schmorl's node inferior endplate of T11. Prior median sternotomy. There are no acute or suspicious osseous abnormalities. IMPRESSION: 1. Small bilateral pleural effusions, left slightly larger than right. Slight vascular congestion. 2. Mild patchy ground-glass opacity in the dependent left greater than right lower lobe may be atelectasis or pneumonia. 3. Moderate central bronchial thickening may be congestive or bronchitic. Aortic Atherosclerosis (ICD10-I70.0). Electronically Signed   By: Narda Rutherford M.D.   On: 10/23/2022 15:35   DG Chest Port 1 View  Result Date: 10/23/2022 CLINICAL DATA:  82 year old female with cough. Productive cough for 2 weeks. Some shortness of breath. EXAM: PORTABLE CHEST 1 VIEW COMPARISON:  Chest radiographs 10/02/2022 and earlier. FINDINGS: Portable AP upright view at 1107 hours. Prior CABG. Stable cardiomegaly and mediastinal contours. Calcified aortic atherosclerosis. Stable left chest dual lumen dialysis type catheter. Patchy, nonspecific increased left lung base opacity partially obscuring the diaphragm now. No superimposed pneumothorax or consolidation. Stable pulmonary vascularity without overt edema. No confluent right lung opacity. Stable visualized osseous  structures.  Negative visible bowel gas. IMPRESSION: 1. Patchy, nonspecific increased left lung base opacity new since last month. PA and lateral views may be helpful if feasible. 2. Otherwise stable cardiomegaly, pulmonary vascularity, prior CABG, chest dialysis catheter, Aortic Atherosclerosis. Electronically Signed   By: Odessa Fleming M.D.   On: 10/23/2022 11:16  [2 weeks]  Assessment:   82 year old female with history of dementia, end-stage renal disease on hemodialysis, A-fib on aspirin, CAD, CHF, small bowel AVMs, GERD, hypertension, colon cancer status postresection in 1992, duodenal adenoma who presented to the hospital with weakness and fatigue following dialysis.  Anemia: Hemoglobin 7.1 on admission.  Received 1 unit of packed red blood cells this admission.  Hemoglobin has been stable,10.3 yesterday.  EGD/enteroscopy this admission showing nonbleeding gastric ulcer status post biopsy (inactive gastritis, no H. pylori), jejunal polyp status post biopsy (tubular adenoma), nonbleeding jejunal diverticulum.  Known AVMs on capsule in the past.  Clinically without overt GI bleeding and stabilized from the standpoint.  Maintained on octreotide which she will continue as an outpatient.  Abdominal pain: CT on April 24 with possible ileus, distended gallbladder.  Ultrasound with nonspecific gallbladder wall thickening.  No gallstones.  General surgery saw patient and felt likely not dealing with biliary colic.  DG abdomen Monday with nonobstructive bowel gas pattern.  She has MiraLAX 3 times daily ordered, refusing some doses.  Reports some improvement in pain after a bowel movement though history has been somewhat inconsistent at times. There was clinical concern for chronic mesenteric ischemia given postprandial pain, CTA ordered on May 1, not completed due to loss of IV access.  IR reports CTA would be needed prior to deciding about any intervention. General surgery feels like risks of CTA outweigh benefits  right now given lact of IV access, allergy to IV contrast. Could consider ultrasound to evaluate mesenteric vessels. But if intervention needed, she will still require contrast for angiography/stenting/etc. It has been considered that octreotide could  be contributing to her abdominal pain however family has been okay with continuing as this has been able to control her IDA.  Patient reports no abdominal pain after breakfast. States she feels better after bowel movements/Miralax.     Plan:   Continue octreotide 100 mcg BID as outpatient. Miralax 17 grams 2-3 times daily to maintain soft stool. Can titrate down if needed in the future.   Mesenteric doppler if available inpatient. Otherwise could order as outpatient.    LOS: 12 days   Leanna Battles. [place] [facility] [phone] 5/2/20248:23 AM

## 2022-11-10 NOTE — Progress Notes (Signed)
[facility] NEPHROLOGY PROGRESS NOTE  Assessment/ Plan: Pt is a 82 y.o. yo female with history of HTN, CAD status post CABG, HLD, A-fib, GI bleed, ESRD on HD presented with generalized weakness/abdomen pain, following to manage ESRD.  # ESRD MWF at [facility]: She had dialysis yesterday with 2 L ultrafiltration, tolerated well.  Plan for next HD tomorrow which can be done outpatient if she is discharged today.   L IJ TDC for dialysis.  # ABLA likely GI source, required transfusion.  EGD/enteroscopy during this admission showing nonbleeding gastric ulcer, duodenal polyp status post biopsy.  On octreotide.  GI is following with plan to do a mesenteric Doppler study.  Hemoglobin 10.3 stable today.  # Abdomen pain: CT scan showing possible ileus.  Clinically improved.  Seen by general surgery and providing supportive/conservative treatment.  # Secondary hyperparathyroidism: Currently on calcitriol, Cinacalcet and just resumed Renvela.  Monitor lab.  # HTN/volume: UF with HD and continue current antihypertensives.  Subjective: Seen and examined at bedside.  No new event.  Tolerated dialysis well.  Denies nausea, vomiting, chest pain, shortness of breath or abdominal pain.  Reports eating well without any problem.  Objective Vital signs in last 24 hours: Vitals:   11/09/22 2325 11/10/22 0407 11/10/22 0801 11/10/22 1045  BP: (!) 162/81 127/68  104/73  Pulse: 81  (!) 105 (!) 104  Resp: 18 19    Temp: 97.8 F (36.6 C) 98.5 F (36.9 C)    TempSrc:      SpO2: 100% 100%  98%  Weight:      Height:       Weight change:   Intake/Output Summary (Last 24 hours) at 11/10/2022 1050 Last data filed at 11/10/2022 0555 Gross per 24 hour  Intake 240 ml  Output 2000 ml  Net -1760 ml        Labs: RENAL PANEL Recent Labs  Lab 11/03/22 1156 11/04/22 0351 11/05/22 0426 11/07/22 0955 11/09/22 1947  NA 133* 132* 133* 131* 129*  K 3.6 4.4 4.3 4.7 4.1  CL 97* 97* 97* 94* 90*  CO2 26  23 25  21* 23  GLUCOSE 138* 74 83 161* 198*  BUN 24* 35* 25* 53* 60*  CREATININE 4.62* 5.67* 4.36* 7.96* 7.55*  CALCIUM 7.4* 7.3* 7.6* 8.0* 8.4*  PHOS  --  5.6* 4.9* 7.7* 6.9*  ALBUMIN 3.0* 2.7*  2.7* 2.7* 3.0* 3.1*     Liver Function Tests: Recent Labs  Lab 11/03/22 1156 11/04/22 0351 11/05/22 0426 11/07/22 0955 11/09/22 1947  AST 12* 11*  --   --   --   ALT 12 11  --   --   --   ALKPHOS 85 74  --   --   --   BILITOT 0.2* 0.8  --   --   --   PROT 5.9* 5.3*  --   --   --   ALBUMIN 3.0* 2.7*  2.7* 2.7* 3.0* 3.1*    No results for input(s): "LIPASE", "AMYLASE" in the last 168 hours. No results for input(s): "AMMONIA" in the last 168 hours. CBC: Recent Labs    04/20/22 2335 04/21/22 0636 04/21/22 2200 08/07/22 1148 08/08/22 0859 11/02/22 1643 11/03/22 1156 11/04/22 0351 11/07/22 0955 11/09/22 0351  HGB 6.7* 6.7*   < > 9.0*   < > 10.3* 9.9* 9.2* 9.6* 10.3*  MCV 122.9* 121.6*   < > 107.8*   < >  --  108.9* 107.6* 105.7* 106.3*  VITAMINB12 >7,500*  --   --  1,981*  --   --   --   --   --   --   FOLATE >40.0  --   --  >40.0  --   --   --   --   --   --   FERRITIN  --  912*  --  912*  --   --   --   --   --   --   TIBC  --  255  --  234*  --   --   --   --   --   --   IRON  --  58  --  63  --   --   --   --   --   --   RETICCTPCT 3.8* 3.7*  --  3.0  --   --   --   --   --   --    < > = values in this interval not displayed.     Cardiac Enzymes: No results for input(s): "CKTOTAL", "CKMB", "CKMBINDEX", "TROPONINI" in the last 168 hours. CBG: Recent Labs  Lab 11/03/22 2020  GLUCAP 200*     Iron Studies: No results for input(s): "IRON", "TIBC", "TRANSFERRIN", "FERRITIN" in the last 72 hours. Studies/Results: No results found.  Medications: Infusions:  anticoagulant sodium citrate      Scheduled Medications:  amiodarone  100 mg Oral Daily   calcitRIOL  1.75 mcg Oral Q M,W,F-HD   Chlorhexidine Gluconate Cloth  6 each Topical Daily   Chlorhexidine  Gluconate Cloth  6 each Topical Q0600   Chlorhexidine Gluconate Cloth  6 each Topical Q0600   cinacalcet  30 mg Oral Q M,W,F-HD   diphenhydrAMINE  50 mg Oral Once   Or   diphenhydrAMINE  50 mg Intravenous Once   gabapentin  100 mg Oral QHS   levETIRAcetam  500 mg Oral Daily   levothyroxine  75 mcg Oral QAC breakfast   lisinopril  20 mg Oral Daily   magic mouthwash  5 mL Oral TID   metoprolol tartrate  12.5 mg Oral BID   octreotide  100 mcg Subcutaneous Q12H   polyethylene glycol  17 g Oral TID   rosuvastatin  10 mg Oral Daily   sevelamer carbonate  800 mg Oral TID WC    have reviewed scheduled and prn medications.  Physical Exam: General:NAD, comfortable Heart:RRR, s1s2 nl Lungs:clear b/l, no crackle Abdomen:soft, Non-tender, non-distended Extremities:No edema Dialysis Access: Left IJ TDC in place.  Shawna [name] Shawna [name] 11/10/2022,10:50 AM  LOS: 12 days

## 2022-11-10 NOTE — Progress Notes (Signed)
Pt had 2 small bowel movements. Pt reported no pain in abdomen and slept through the night. Vitals stable this a.m.

## 2022-11-11 ENCOUNTER — Telehealth: Payer: Self-pay | Admitting: Gastroenterology

## 2022-11-11 ENCOUNTER — Inpatient Hospital Stay (HOSPITAL_COMMUNITY): Payer: Medicare Other

## 2022-11-11 DIAGNOSIS — R1084 Generalized abdominal pain: Secondary | ICD-10-CM | POA: Diagnosis not present

## 2022-11-11 DIAGNOSIS — Z8774 Personal history of (corrected) congenital malformations of heart and circulatory system: Secondary | ICD-10-CM

## 2022-11-11 DIAGNOSIS — D649 Anemia, unspecified: Secondary | ICD-10-CM | POA: Diagnosis not present

## 2022-11-11 LAB — BASIC METABOLIC PANEL
Anion gap: 13 (ref 5–15)
BUN: 47 mg/dL — ABNORMAL HIGH (ref 8–23)
CO2: 23 mmol/L (ref 22–32)
Calcium: 8 mg/dL — ABNORMAL LOW (ref 8.9–10.3)
Chloride: 95 mmol/L — ABNORMAL LOW (ref 98–111)
Creatinine, Ser: 5.56 mg/dL — ABNORMAL HIGH (ref 0.44–1.00)
GFR, Estimated: 7 mL/min — ABNORMAL LOW (ref >60)
Glucose, Bld: 98 mg/dL (ref 70–99)
Potassium: 4 mmol/L (ref 3.5–5.1)
Sodium: 131 mmol/L — ABNORMAL LOW (ref 135–145)

## 2022-11-11 LAB — CBC
HCT: 26.7 % — ABNORMAL LOW (ref 36.0–46.0)
Hemoglobin: 8.8 g/dL — ABNORMAL LOW (ref 12.0–15.0)
MCH: 35.6 pg — ABNORMAL HIGH (ref 26.0–34.0)
MCHC: 33 g/dL (ref 30.0–36.0)
MCV: 108.1 fL — ABNORMAL HIGH (ref 80.0–100.0)
Platelets: 229 10*3/uL (ref 150–400)
RBC: 2.47 MIL/uL — ABNORMAL LOW (ref 3.87–5.11)
RDW: 25.2 % — ABNORMAL HIGH (ref 11.5–15.5)
WBC: 7.8 10*3/uL (ref 4.0–10.5)
nRBC: 0 % (ref 0.0–0.2)

## 2022-11-11 LAB — MAGNESIUM: Magnesium: 1.3 mg/dL — ABNORMAL LOW (ref 1.7–2.4)

## 2022-11-11 LAB — PHOSPHORUS: Phosphorus: 4.9 mg/dL — ABNORMAL HIGH (ref 2.5–4.6)

## 2022-11-11 MED ORDER — CHLORHEXIDINE GLUCONATE CLOTH 2 % EX PADS: 6.0000 | MEDICATED_PAD | Freq: Every day | CUTANEOUS | Status: DC

## 2022-11-11 MED ORDER — MAGNESIUM SULFATE 2 GM/50ML IV SOLN
2.0000 g | Freq: Once | INTRAVENOUS | Status: AC
  Administered 2022-11-11: 2 g via INTRAVENOUS
  Filled 2022-11-11: qty 50

## 2022-11-11 MED ORDER — METOPROLOL TARTRATE 25 MG PO TABS
25.0000 mg | ORAL_TABLET | Freq: Once | ORAL | Status: AC
  Administered 2022-11-11: 25 mg via ORAL
  Filled 2022-11-11: qty 1

## 2022-11-11 MED ORDER — METOPROLOL TARTRATE 5 MG/5ML IV SOLN: 5.0000 mg | Freq: Once | INTRAVENOUS | Status: AC

## 2022-11-11 MED ORDER — HEPARIN SODIUM (PORCINE) 1000 UNIT/ML IJ SOLN
INTRAMUSCULAR | Status: AC
  Filled 2022-11-11: qty 5

## 2022-11-11 MED ORDER — CHLORHEXIDINE GLUCONATE CLOTH 2 % EX PADS
6.0000 | MEDICATED_PAD | Freq: Every day | CUTANEOUS | Status: DC
  Administered 2022-11-12 – 2022-11-15 (×4): 6 via TOPICAL

## 2022-11-11 MED ORDER — DARBEPOETIN ALFA 60 MCG/0.3ML IJ SOSY
60.0000 ug | PREFILLED_SYRINGE | Freq: Once | INTRAMUSCULAR | Status: AC
  Administered 2022-11-11: 60 ug via SUBCUTANEOUS
  Filled 2022-11-11: qty 0.3

## 2022-11-11 MED ORDER — DIPHENHYDRAMINE HCL 25 MG PO CAPS
50.0000 mg | ORAL_CAPSULE | Freq: Once | ORAL | Status: AC
  Administered 2022-11-12: 50 mg via ORAL

## 2022-11-11 MED ORDER — PREDNISONE 20 MG PO TABS
50.0000 mg | ORAL_TABLET | Freq: Four times a day (QID) | ORAL | Status: AC
  Administered 2022-11-12: 50 mg via ORAL
  Filled 2022-11-11 (×2): qty 3

## 2022-11-11 NOTE — Progress Notes (Signed)
Once patient returned back to the room after dialysis, pt HR became tachycardic 130s-140s, previous HR 60s-70s during dialysis, pt complained of head hurting and feeling dizzy, EKG obtained, notified MD, multiple unsuccessful attempts to get IV, MD aware of no IV at this time, will give PO metoprolol per MD request.

## 2022-11-11 NOTE — Progress Notes (Signed)
Report given to [facility]

## 2022-11-11 NOTE — Telephone Encounter (Signed)
Patient is still inpatient

## 2022-11-11 NOTE — Progress Notes (Signed)
Pt slept through the night. No c/o pain noted. Pt A&O x 2.

## 2022-11-11 NOTE — Telephone Encounter (Signed)
Unsure when patient will be discharged.  I know that we are awaiting discharge to submit for octreotide prior authorization however he will look into seeing if there is a representative or social worker/case management that can assist in helping [place] find out how patient can receive the injections at home given her husband does not feel as though he can do it.  Is a service that [facility] can help [place] with?  Brooke Bonito, MSN, APRN, FNP-BC, AGACNP-BC [facility] at [facility]

## 2022-11-11 NOTE — Progress Notes (Signed)
-  Apparently earlier this morning prior to hemodialysis patient was bradycardic so her metoprolol was held - Patient about to be transferred to [facility] for CT GI bleed study after prednisone prep for contrast allergy however she was found to be somewhat tachycardic with heart rate around 130 - -Patient completed hemodialysis earlier today 2 L removed  Patient's husband and daughter at bedside, questions answered - Patient denies chest pains or shortness of breath - Okay to give IV and p.o. metoprolol - Blood pressure systolic 140s to 161W during my visit at bedside - Heart rate 120s to 130s, irregular but not irregularly irregular - EKG with wide-complex tachycardia -LBBB is not new - Updated primary attending Dr. Marnette Burgess, MD

## 2022-11-11 NOTE — Telephone Encounter (Signed)
Please make patient a hospital follow-up in 2 to 3 weeks with Dr. Levon Hedger or any APP.  Brooke Bonito, MSN, APRN, FNP-BC, AGACNP-BC [facility] at [facility]

## 2022-11-11 NOTE — Progress Notes (Signed)
Metoprolol given per MD order for tachycardia. HR rechecked and is still elevated at 132. Pt still without IV access. Report given to [facility]. [facility] en route.

## 2022-11-11 NOTE — Progress Notes (Signed)
Gastroenterology Progress Note   Referring Provider: No ref. provider found Primary Care Physician:  [facility] Primary Gastroenterologist:  Dolores Frame, MD  Patient ID: [name]; [id number]; 03-17-1941    Subjective   Denies pain this morning. No pain with eating lunch meal. Slept well overnight. States she is ready to go home soon.   Objective   Vital signs in last 24 hours Temp:  [97.8 F (36.6 C)-99 F (37.2 C)] 97.8 F (36.6 C) (05/03 1044) Pulse Rate:  [53-102] 53 (05/03 1044) Resp:  [18-20] 18 (05/03 0545) BP: (102-133)/(53-76) 133/54 (05/03 1044) SpO2:  [95 %-100 %] 100 % (05/03 1044) Last BM Date : 11/10/22  Physical Exam General:   Alert and oriented, pleasant in NAD Head:  Normocephalic and atraumatic. Eyes:  No icterus, sclera clear. Conjuctiva pink.  Mouth:  Without lesions, mucosa pink and moist.  Abdomen:  Bowel sounds present, soft, non-tender, non-distended. No HSM or hernias noted. No rebound or guarding. No masses appreciated  Msk:  Symmetrical without gross deformities. Normal posture. Neurologic:  Alert and  oriented x4;  grossly normal neurologically. Psych:  Alert and cooperative. Normal mood and affect.  Intake/Output from previous day: 05/02 0701 - 05/03 0700 In: 720 [P.O.:720] Out: -  Intake/Output this shift: No intake/output data recorded.  Lab Results  Recent Labs    11/09/22 0351 11/11/22 0407  WBC 9.1 7.8  HGB 10.3* 8.8*  HCT 30.6* 26.7*  PLT 220 229   BMET Recent Labs    11/09/22 1947 11/11/22 0407  NA 129* 131*  K 4.1 4.0  CL 90* 95*  CO2 23 23  GLUCOSE 198* 98  BUN 60* 47*  CREATININE 7.55* 5.56*  CALCIUM 8.4* 8.0*   LFT Recent Labs    11/09/22 1947  ALBUMIN 3.1*   PT/INR No results for input(s): "LABPROT", "INR" in the last 72 hours. Hepatitis Panel No results for input(s): "HEPBSAG", "HCVAB", "HEPAIGM", "HEPBIGM" in the last 72 hours.  Studies/Results [place] MESENTERIC  ARTERIES  Result Date: 11/11/2022 CLINICAL DATA:  Abdominal pain EXAM: [place] MESENTERIC ARTERIAL DOPPLER COMPARISON:  CT abdomen pelvis without contrast 11/02/2022 FINDINGS: Celiac axis: 141 cm/sec Unable to perform inspiration and expiration evaluation due to patient condition. Splenic artery: 175 cm/sec Hepatic artery: 45 cm/sec SMA: 315 cm/sec IMA: Not visualized Aortic size: 1.6 cm proximally, 1.2 cm in the mid segment and 1.1 cm distally IMPRESSION: Elevated peak systolic velocity in the superior mesenteric artery is suspicious for significant stenosis. Electronically Signed   By: Acquanetta Belling M.D.   On: 11/11/2022 10:25   DG Abd 1 View  Result Date: 11/07/2022 CLINICAL DATA:  Abdominal pain/distention EXAM: ABDOMEN - 1 VIEW COMPARISON:  Abdominal radiograph dated April 27th 2020 FINDINGS: Nonobstructive bowel-gas pattern. Surgical clips seen in the pelvis. No acute osseous abnormality. IMPRESSION: Nonobstructive bowel-gas pattern. Electronically Signed   By: Allegra Lai M.D.   On: 11/07/2022 17:57   DG Abd Portable 2V  Result Date: 11/05/2022 CLINICAL DATA:  Abdominal pain EXAM: PORTABLE ABDOMEN - 2 VIEW COMPARISON:  CT 11/02/2022 FINDINGS: No dilated loops of large or small bowel identified. Increasing stool burden identified within the colon. No air-fluid levels identified. Surgical clips noted within the pelvis. Left lung base opacity identified within the imaged portions of the lower chest. Patient has a left chest wall dialysis catheter with tips in the right atrium. Signs of previous CABG procedure. IMPRESSION: 1. Nonobstructive bowel gas pattern. 2. Increasing stool burden within the colon. 3.  Left base opacity identified within the imaged portions of the chest. Correlate for any clinical signs/symptoms of pneumonia. Electronically Signed   By: Signa Kell M.D.   On: 11/05/2022 10:27   US Abdomen Limited RUQ (LIVER/GB)  Result Date: 11/03/2022 CLINICAL DATA:  Right upper quadrant pain  EXAM: ULTRASOUND ABDOMEN LIMITED RIGHT UPPER QUADRANT COMPARISON:  CT 11/02/2022.  Older exams as well. FINDINGS: Gallbladder: Gallbladder is mildly distended. No shadowing stones or adjacent fluid but there is gallbladder wall thickening. Common bile duct: Diameter: 4 mm Liver: No focal lesion identified. Within normal limits in parenchymal echogenicity. Portal vein is patent on color Doppler imaging with normal direction of blood flow towards the liver. Other: None. IMPRESSION: No shadowing stones. The gallbladder wall is thickened which is nonspecific. No adjacent fluid. No reported sonographic Murphy sign. Please correlate with clinical symptomatology. If there is further concern, HIDA scan could be considered as clinically appropriate. No biliary ductal dilatation Electronically Signed   By: Karen Kays M.D.   On: 11/03/2022 14:59   CT ABDOMEN PELVIS WO CONTRAST  Result Date: 11/02/2022 CLINICAL DATA:  Acute abdominal pain after endoscopy EXAM: CT ABDOMEN AND PELVIS WITHOUT CONTRAST TECHNIQUE: Multidetector CT imaging of the abdomen and pelvis was performed following the standard protocol without IV contrast. RADIATION DOSE REDUCTION: This exam was performed according to the departmental dose-optimization program which includes automated exposure control, adjustment of the mA and/or kV according to patient size and/or use of iterative reconstruction technique. COMPARISON:  CT 04/06/2020 without contrast FINDINGS: Lower chest: Small bilateral pleural effusions identified with some adjacent opacities. Atelectasis is favored over infiltrate recommend follow-up. The heart is enlarged. Status post median sternotomy. Coronary artery calcifications are seen. Hepatobiliary: On this non IV contrast exam, the liver has some small calcifications, possibly related to old granulomatous disease. However the gallbladder is distended with some slight wall thickening and stranding. There is also some luminal high density.  Sludge or stones are possible. If there is concern further of acute cholecystitis an ultrasound may be of some benefit as the next step in the workup. Pancreas: Moderate atrophy of the pancreas.  No obvious mass. Spleen: Spleen is nonenlarged. Adrenals/Urinary Tract: The adrenal glands are grossly preserved. There is severe atrophy of the kidneys with several vascular calcifications and tiny cystic areas. The more complex hyperdense area along the upper pole of the right kidney is stable. Previously measuring 2.8 x 2.0 cm and today 2.5 by 1.5 cm. Contracted urinary bladder. Stomach/Bowel: There is some fluid and debris in the stomach. Moderate diffuse scattered colonic stool. Left-sided colonic diverticulosis. Surgical changes along the rectosigmoid colon. No obstruction. Surgical changes as well along loops of bowel in the right lower quadrant. The appendix is not well seen. There are several loops of small bowel in the midabdomen which have air-fluid levels and are mildly dilated measuring up to 3.2 cm. No abrupt caliber change. Vascular/Lymphatic: Extensive vascular calcifications. Normal caliber aorta and IVC. No specific abnormal lymph node enlargement identified in the abdomen and pelvis. Reproductive: Status post hysterectomy. No adnexal masses. Other: Anasarca. No obstruction, free air or free fluid. Mild areas of mesenteric stranding and presacral space stranding. Musculoskeletal: Osteopenia. Scattered degenerative changes. Schmorl's node changes along lumbar spine and thoracic spine. IMPRESSION: Once again severely atrophic kidneys with calcifications and cystic areas including complex area along the upper pole of the right kidney which is stable in size. Recommend continued surveillance. Developing small bilateral pleural effusion with adjacent lung opacities. Atelectasis versus infiltrate.  Recommend follow-up. Increasing anasarca mesenteric stranding. There are some mildly dilated loops of small bowel  with some air-fluid levels but no abrupt transition. Ileus versus mild partial obstruction is in the differential recommend close follow-up. Colonic diverticulosis. Distended gallbladder with wall thickening and some stranding. In addition there is some high attenuation material in the gallbladder. Please correlate with any sludge or stones. Please correlate with any prior contrast administration. Overall recommend further workup with ultrasound to further delineate when able. Electronically Signed   By: Karen Kays M.D.   On: 11/02/2022 14:27   MR BRAIN WO CONTRAST  Result Date: 10/31/2022 CLINICAL DATA:  Altered mental status EXAM: MRI HEAD WITHOUT CONTRAST TECHNIQUE: Multiplanar, multiecho pulse sequences of the brain and surrounding structures were obtained without intravenous contrast. COMPARISON:  04/23/2022 MRI, correlation is also made with 10/30/2022 CT head FINDINGS: Brain: No restricted diffusion to suggest acute or subacute infarct. No acute hemorrhage, mass, or midline shift. No hydrocephalus. 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 Normal pituitary and craniocervical junction. Remote lacunar infarcts in the bilateral basal ganglia and left cerebellum. Punctate foci of hemosiderin deposition in the right cerebellum, right campus, and right frontal and parietal lobes, possibly sequela of prior microhemorrhage. No superficial siderosis. Confluent T2 hyperintense signal in the periventricular white matter, likely the sequela of moderate chronic small vessel ischemic disease. Vascular: Normal arterial flow voids. Skull and upper cervical spine: Normal marrow signal. Sinuses/Orbits: Mucosal thickening in the left sphenoid sinus. No acute finding in the orbits. Other: Trace fluid in the mastoid air cells. IMPRESSION: 1. No acute intracranial process. No evidence of acute or  subacute infarct. 2. 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 Electronically Signed   By: Wiliam Ke M.D.   On: 10/31/2022 19:31   EEG adult  Result Date: 10/31/2022 Charlsie Quest, MD     10/31/2022  2:57 PM Patient Name: RESHMA COOMBER MRN: [id number] Epilepsy Attending: Charlsie Quest Referring Physician/Provider: Rejeana Brock, MD Date:10/31/2022 Duration: 22.39 mins Patient history: 82yo F getting eeg to evaluate for seizure Level of alertness: Awake, asleep AEDs during EEG study: LEV Technical aspects: This EEG study was done with scalp electrodes positioned according to the 10-20 International system of electrode placement. Electrical activity was reviewed with band pass filter of 1-70Hz , sensitivity of 7 uV/mm, display speed of 21mm/sec with a 60Hz  notched filter applied as appropriate. EEG data were recorded continuously and digitally stored.  Video monitoring was available and reviewed as appropriate. Description: The posterior dominant rhythm consists of 8-9 Hz activity of moderate voltage (25-35 uV) seen predominantly in posterior head regions, symmetric and reactive to eye opening and eye closing. Sleep was characterized by vertex waves, sleep spindles (12 to 14 Hz), maximal frontocentral region.  EEG showed intermittent generalized 3 to 6 Hz theta-delta slowing. Hyperventilation and photic stimulation were not performed.   ABNORMALITY - Intermittent slow, generalized IMPRESSION: This study is suggestive of mild diffuse encephalopathy. No seizures or epileptiform discharges were seen throughout the recording. Charlsie Quest   DG Abd 1 View  Result Date: 10/31/2022 CLINICAL DATA:  Abdominal pain EXAM: ABDOMEN - 1 VIEW COMPARISON:  08/29/2022 FINDINGS: Bowel gas pattern is nonspecific. Gas and stool are noted in colon. Small amount of stool is  seen in colon. There are surgical staples and clips in lower abdomen and  pelvis. There is no identifiable electronic device in abdomen and pelvis in the current study. In the previous examination, an electronic device, capsule most noted in region of rectum suggesting interval passage. Arterial calcifications are seen. IMPRESSION: Nonspecific bowel gas pattern. There is no evidence of any metallic electronic capsule in the abdomen and pelvis. Electronically Signed   By: Ernie Avena M.D.   On: 10/31/2022 08:40   CT HEAD CODE STROKE WO CONTRAST  Result Date: 10/30/2022 CLINICAL DATA:  Code stroke.  Seizure, altered mental status. EXAM: CT HEAD WITHOUT CONTRAST TECHNIQUE: Contiguous axial images were obtained from the base of the skull through the vertex without intravenous contrast. RADIATION DOSE REDUCTION: This exam was performed according to the departmental dose-optimization program which includes automated exposure control, adjustment of the mA and/or kV according to patient size and/or use of iterative reconstruction technique. COMPARISON:  CT head 08/26/2022. FINDINGS: Brain: There is no evidence of acute territorial infarct. There are low-density subdural collections overlying both cerebral hemispheres measuring up to 6 mm on the left and 3 mm on the right without evidence of acute blood products, new since the prior head CT from 08/26/2022. There is no mass effect on the underlying brain parenchyma. Parenchymal volume loss with prominence of the ventricular system and extra-axial CSF spaces is stable. The ventricles are unchanged in size and configuration. A remote infarct in the left basal ganglia is unchanged. Additional confluent hypodensity in the supratentorial white matter likely reflects sequela of underlying chronic small-vessel ischemic change. The pituitary and suprasellar region are normal. There is no mass lesion. There is no midline shift. Vascular: There is calcification of the bilateral carotid siphons and vertebral arteries. Skull: Normal. Negative for  fracture or focal lesion. Sinuses/Orbits: The paranasal sinuses are clear. The globes and orbits are unremarkable. Other: None. ASPECTS [facility] Stroke Program Early CT Score) - Ganglionic level infarction (caudate, lentiform nuclei, internal capsule, insula, M1-M3 cortex): 7 - Supraganglionic infarction (M4-M6 cortex): 3 Total score (0-10 with 10 being normal): 10 IMPRESSION: 1. No evidence of acute intracranial hemorrhage or acute territorial infarct. 2. Low-density subdural hygromas overlying both cerebral hemispheres measuring up to 6 mm on the left and 3 mm on the right, new since the ct head from 08/26/2022 but without evidence of acute blood within the collections. These results communicated via Amion at the time of interpretation on 10/30/2022 at 6:44 pm to provider [facility] , who verbally acknowledged these results. Electronically Signed   By: Lesia Hausen M.D.   On: 10/30/2022 18:46   DG Chest Port 1 View  Result Date: 10/28/2022 CLINICAL DATA:  Cough EXAM: PORTABLE CHEST 1 VIEW COMPARISON:  10/23/2022 FINDINGS: Prior CABG. Left dialysis catheter remains in place with the tip in the right atrium. Mild cardiomegaly. No overt edema. Bibasilar atelectasis. No effusions or acute bony abnormality. IMPRESSION: Cardiomegaly, bibasilar atelectasis. Electronically Signed   By: Charlett Nose M.D.   On: 10/28/2022 22:15   CT Chest Wo Contrast  Result Date: 10/23/2022 CLINICAL DATA:  Pneumonia, complication suspected, xray done EXAM: CT CHEST WITHOUT CONTRAST TECHNIQUE: Multidetector CT imaging of the chest was performed following the standard protocol without IV contrast. RADIATION DOSE REDUCTION: This exam was performed according to the departmental dose-optimization program which includes automated exposure control, adjustment of the mA and/or kV according to patient size and/or use of iterative reconstruction technique. COMPARISON:  Radiograph earlier today. Additional prior radiographs  reviewed. Most  recent chest CT available 07/16/2021 FINDINGS: Cardiovascular: Left-sided dialysis catheter tip is in the atrial caval junction. Mild cardiomegaly. Post CABG with calcification of native coronary arteries. Decreased density of the blood pool typical of anemia. Advanced aortic atherosclerosis with aortic tortuosity. No aneurysm or periaortic stranding. No pericardial effusion. Mediastinum/Nodes: 14 mm lower anterior paratracheal node, similar to 2023 exam. Scattered additional shoddy mediastinal lymph nodes are also unchanged. No progressive adenopathy. Patulous esophagus. Lungs/Pleura: Small bilateral pleural effusions, left slightly larger than right. Slight vascular congestion. Minimal fluid in the fissures. Mild patchy ground-glass opacity in the dependent left greater than right lower lobe. Moderate central bronchial thickening which may be congestive or bronchitic. no endobronchial debris. No pulmonary mass. Upper Abdomen: Sequela of chronic renal disease with renal atrophy and cysts. No specific imaging follow-up of these cysts is recommended. A peripherally calcified density in the right upper kidney is stable from 2023 exam and consistent with a complex cyst, also needing no further follow-up. Left colonic diverticulosis. Musculoskeletal: Exaggerated thoracic kyphosis with degenerative change throughout the thoracic spine. Chronic mild L1 compression deformity. Slight increase in Schmorl's node inferior endplate of T11. Prior median sternotomy. There are no acute or suspicious osseous abnormalities. IMPRESSION: 1. Small bilateral pleural effusions, left slightly larger than right. Slight vascular congestion. 2. Mild patchy ground-glass opacity in the dependent left greater than right lower lobe may be atelectasis or pneumonia. 3. Moderate central bronchial thickening may be congestive or bronchitic. Aortic Atherosclerosis (ICD10-I70.0). Electronically Signed   By: Narda Rutherford M.D.   On: 10/23/2022 15:35    DG Chest Port 1 View  Result Date: 10/23/2022 CLINICAL DATA:  82 year old female with cough. Productive cough for 2 weeks. Some shortness of breath. EXAM: PORTABLE CHEST 1 VIEW COMPARISON:  Chest radiographs 10/02/2022 and earlier. FINDINGS: Portable AP upright view at 1107 hours. Prior CABG. Stable cardiomegaly and mediastinal contours. Calcified aortic atherosclerosis. Stable left chest dual lumen dialysis type catheter. Patchy, nonspecific increased left lung base opacity partially obscuring the diaphragm now. No superimposed pneumothorax or consolidation. Stable pulmonary vascularity without overt edema. No confluent right lung opacity. Stable visualized osseous structures.  Negative visible bowel gas. IMPRESSION: 1. Patchy, nonspecific increased left lung base opacity new since last month. PA and lateral views may be helpful if feasible. 2. Otherwise stable cardiomegaly, pulmonary vascularity, prior CABG, chest dialysis catheter, Aortic Atherosclerosis. Electronically Signed   By: Odessa Fleming M.D.   On: 10/23/2022 11:16    Assessment  82 y.o. female with a history of dementia, ESRD on hemodialysis, A-fib on aspirin, CAD, CHF, small bowel AVMs, GERD, HTN, colon cancer s/p resection in 1992, duodenal adenoma who presented to the hospital with weakness and fatigue following dialysis.  GI consulted for further evaluation of anemia and further evaluation of abdominal pain.  Anemia: Hgb 7.1 on admission.  Received 1 unit of blood this admission.  Drop in hemoglobin to 8.8 today, however has been stable at her baseline prior to this.  EGD/enteroscopy this admission with nonbleeding gastric ulcer with inactive gastritis, negative for H. pylori, jejunal polyp (tubular adenoma), and nonbleeding jejunal diverticulum.  She has had known AVMs in the past on capsule study.  Not having any overt GI bleeding currently.  Maintained on octreotide which she will continue outpatient.  She will need assistance with social  work/case management to have someone to be able to administer injections at home as her husband is not comfortable with doing this.  Abdominal pain: CT in April  2020 for possible ileus, distended gallbladder.  Ultrasound with nonspecific gallbladder wall thickening without cholelithiasis.  General surgery consulted and felt as though this was not biliary colic.  Abdominal x-ray earlier this week with nonobstructive bowel gas pattern.  She was started on MiraLAX 3 times daily and was initially noting some improvement with bowel movements however her description and history of describing pain has been inconsistent.  Given her reported postprandial abdominal pain there was concern for mesenteric ischemia and CTA was ordered however patient had lost IV access.  She underwent mesenteric Dopplers today revealing elevated peak systolic velocity in the superior mesenteric artery suspicious for significant stenosis but IMA not able to be visualized.  Suspect that her abdominal pain is multifactorial in the setting of constipation and octreotide.  Today she has had improvement in her abdominal pain, feeling very well.  Was able to eat lunch today without any abdominal pain.  Had a bowel movement yesterday.  Given concern for possible mesenteric ischemia and difficulty with IV access she is being transferred to [facility] to have CTA performed and receive dialysis afterwards.  She does have a contrast allergy therefore she will need pretreatment.  Plan / Recommendations  Continue miralax 17g 2-3 times daily Continue Octreotide 100 mcg BID outpatient After discussion with multiple disciplines it was decided to proceed with CTA of the abdomen and pelvis given incomplete evaluation with mesenteric dopplers. Hospitalist will transfer patient to [facility] in order to obtain IV access and receive dialysis post administration of contrast.  She will follow-up in the clinic and 3 to 4 weeks.  We will work to obtain prior  authorization to receive octreotide outpatient and our GI clinic will work toward obtaining resources for patient to receive injections.    LOS: 13 days    11/11/2022, 1:56 PM   Brooke Bonito, MSN, FNP-BC, AGACNP-BC [facility]

## 2022-11-11 NOTE — Progress Notes (Signed)
[facility] NEPHROLOGY PROGRESS NOTE  Assessment/ Plan: Pt is a 82 y.o. yo female with history of HTN, CAD status post CABG, HLD, A-fib, GI bleed, ESRD on HD presented with generalized weakness/abdomen pain, following to manage ESRD.  # ESRD MWF at [facility]: Plan for regular maintenance dialysis today. L IJ TDC for dialysis.  # ABLA likely GI source, required transfusion.  EGD/enteroscopy during this admission showing nonbleeding gastric ulcer, duodenal polyp status post biopsy.  On octreotide.  GI is following with plan to do a mesenteric Doppler study outpatient.  Monitor hemoglobin, will dose Aranesp with HD today.  # Abdomen pain: CT scan showing possible ileus.  Clinically improved.  Seen by general surgery and providing supportive/conservative treatment.  # Secondary hyperparathyroidism: Currently on calcitriol, Cinacalcet and just resumed Renvela.  Monitor lab.  # HTN/volume: UF with HD and continue current antihypertensives.  Please contact on-call nephrologist with any question or concern in the weekend.  Subjective: Seen and examined at bedside.  No new event.  Denies any nausea, vomiting, chest pain, shortness of breath. Objective Vital signs in last 24 hours: Vitals:   11/10/22 1330 11/10/22 2137 11/11/22 0545 11/11/22 1044  BP: 117/83 128/76 (!) 102/53 (!) 133/54  Pulse: (!) 108 91 (!) 102 (!) 53  Resp: 18 20 18    Temp: 97.8 F (36.6 C) 99 F (37.2 C) 98.1 F (36.7 C) 97.8 F (36.6 C)  TempSrc: Oral   Oral  SpO2: 100% 98% 95% 100%  Weight:      Height:       Weight change:   Intake/Output Summary (Last 24 hours) at 11/11/2022 1056 Last data filed at 11/11/2022 0900 Gross per 24 hour  Intake 720 ml  Output --  Net 720 ml        Labs: RENAL PANEL Recent Labs  Lab 11/05/22 0426 11/07/22 0955 11/09/22 1947 11/11/22 0407  NA 133* 131* 129* 131*  K 4.3 4.7 4.1 4.0  CL 97* 94* 90* 95*  CO2 25 21* 23 23  GLUCOSE 83 161* 198* 98  BUN 25* 53*  60* 47*  CREATININE 4.36* 7.96* 7.55* 5.56*  CALCIUM 7.6* 8.0* 8.4* 8.0*  MG  --   --   --  1.3*  PHOS 4.9* 7.7* 6.9* 4.9*  ALBUMIN 2.7* 3.0* 3.1*  --      Liver Function Tests: Recent Labs  Lab 11/05/22 0426 11/07/22 0955 11/09/22 1947  ALBUMIN 2.7* 3.0* 3.1*    No results for input(s): "LIPASE", "AMYLASE" in the last 168 hours. No results for input(s): "AMMONIA" in the last 168 hours. CBC: Recent Labs    04/20/22 2335 04/21/22 0636 04/21/22 2200 08/07/22 1148 08/08/22 0859 11/03/22 1156 11/04/22 0351 11/07/22 0955 11/09/22 0351 11/11/22 0407  HGB 6.7* 6.7*   < > 9.0*   < > 9.9* 9.2* 9.6* 10.3* 8.8*  MCV 122.9* 121.6*   < > 107.8*   < > 108.9* 107.6* 105.7* 106.3* 108.1*  VITAMINB12 >7,500*  --   --  1,981*  --   --   --   --   --   --   FOLATE >40.0  --   --  >40.0  --   --   --   --   --   --   FERRITIN  --  912*  --  912*  --   --   --   --   --   --   TIBC  --  255  --  234*  --   --   --   --   --   --   IRON  --  58  --  63  --   --   --   --   --   --   RETICCTPCT 3.8* 3.7*  --  3.0  --   --   --   --   --   --    < > = values in this interval not displayed.     Cardiac Enzymes: No results for input(s): "CKTOTAL", "CKMB", "CKMBINDEX", "TROPONINI" in the last 168 hours. CBG: Recent Labs  Lab 11/10/22 2140  GLUCAP 165*     Iron Studies: No results for input(s): "IRON", "TIBC", "TRANSFERRIN", "FERRITIN" in the last 72 hours. Studies/Results: [place] MESENTERIC ARTERIES  Result Date: 11/11/2022 CLINICAL DATA:  Abdominal pain EXAM: [place] MESENTERIC ARTERIAL DOPPLER COMPARISON:  CT abdomen pelvis without contrast 11/02/2022 FINDINGS: Celiac axis: 141 cm/sec Unable to perform inspiration and expiration evaluation due to patient condition. Splenic artery: 175 cm/sec Hepatic artery: 45 cm/sec SMA: 315 cm/sec IMA: Not visualized Aortic size: 1.6 cm proximally, 1.2 cm in the mid segment and 1.1 cm distally IMPRESSION: Elevated peak systolic velocity in the superior  mesenteric artery is suspicious for significant stenosis. Electronically Signed   By: Acquanetta Belling M.D.   On: 11/11/2022 10:25    Medications: Infusions:  anticoagulant sodium citrate     magnesium sulfate bolus IVPB      Scheduled Medications:  amiodarone  100 mg Oral Daily   calcitRIOL  1.75 mcg Oral Q M,W,F-HD   Chlorhexidine Gluconate Cloth  6 each Topical Daily   Chlorhexidine Gluconate Cloth  6 each Topical Q0600   Chlorhexidine Gluconate Cloth  6 each Topical Q0600   Chlorhexidine Gluconate Cloth  6 each Topical Q0600   cinacalcet  30 mg Oral Q M,W,F-HD   diphenhydrAMINE  50 mg Oral Once   Or   diphenhydrAMINE  50 mg Intravenous Once   gabapentin  100 mg Oral QHS   levETIRAcetam  500 mg Oral Daily   levothyroxine  75 mcg Oral QAC breakfast   lisinopril  20 mg Oral Daily   magic mouthwash  5 mL Oral TID   metoprolol tartrate  12.5 mg Oral BID   octreotide  100 mcg Subcutaneous Q12H   polyethylene glycol  17 g Oral TID   rosuvastatin  10 mg Oral Daily   sevelamer carbonate  800 mg Oral TID WC    have reviewed scheduled and prn medications.  Physical Exam: General:NAD, comfortable Heart:RRR, s1s2 nl Lungs:clear b/l, no crackle Abdomen:soft, Non-tender, non-distended Extremities:No edema Dialysis Access: Left IJ TDC in place.  Rollan Roger Prasad Vitoria Conyer 11/11/2022,10:56 AM  LOS: 13 days

## 2022-11-11 NOTE — Progress Notes (Signed)
Pt transferred to [facility] by [facility]. HR still elevated at 130.

## 2022-11-11 NOTE — Progress Notes (Signed)
   HEMODIALYSIS TREATMENT NOTE:   Uneventful 3.5 hour heparin-free treatment completed using LIJ TDC. Goal met: 2 liters removed without interruption in UF.  Hemodynamically stable throughout session.  All blood was returned.    Post-dialysis:  11/11/22 1750  Vitals  Temp 98 F (36.7 C)  Temp Source Oral  BP (!) 158/63  MAP (mmHg) 95  BP Location Right Arm  BP Method Automatic  Patient Position (if appropriate) Lying  Pulse Rate 72  Pulse Rate Source Monitor  ECG Heart Rate 71  Resp 18  Oxygen Therapy  SpO2 98 %  O2 Device Room Air  Post Treatment  Dialyzer Clearance Lightly streaked  Duration of HD Treatment -hour(s) 3.5 hour(s)  Hemodialysis Intake (mL) 0 mL  Liters Processed 75.3  Fluid Removed (mL) 2000 mL  Tolerated HD Treatment Yes  Post-Hemodialysis Comments Goal met  Hemodialysis Catheter Left Subclavian Double lumen Temporary (Non-Tunneled)  Placement Date: 06/09/22   Placed prior to admission: Yes  Orientation: Left  Access Location: Subclavian  Hemodialysis Catheter Type: Double lumen Temporary (Non-Tunneled)  Blue Lumen Status Flushed;Heparin locked;Dead end cap in place  Red Lumen Status Flushed;Heparin locked;Dead end cap in place  Purple Lumen Status N/A  Catheter fill solution Heparin 1000 units/ml  Catheter fill volume (Arterial) 1.9 cc  Catheter fill volume (Venous) 1.9  Dressing Type Transparent;Tube stabilization device  Dressing Status Antimicrobial disc in place;Clean, Dry, Intact  Drainage Description None  Dressing Change Due 11/14/22  Post treatment catheter status Capped and Clamped   Arman Filter, RN AP KDU

## 2022-11-11 NOTE — Progress Notes (Addendum)
PROGRESS NOTE    Shawna [name]  RUE:[id number] DOB: Dec 18, 1940 DOA: 10/28/2022  PCP: [facility]   Brief Narrative:  This 82 year old female with PMH significant of ESRD on HD (MWF), Coronary artery disease status post CABG, CHF, hypertension, hyperlipidemia, paroxysmal atrial fibrillation, hyperlipidemia, GI bleed presented in the ED with generalized weakness and not feeling well after dialysis.  Patient was found to have hemoglobin 7.1 on arrival,  She was given 2 units of PRBC.  GI was consulted and was admitted for further evaluation.  10/30/22: Patient was found to be altered mental status and was noted to have seizure-like activity.  Patient was minimally responsive to painful stimuli.  The shaking activity lasted 3 minutes.  Code stroke was activated.  CT head negative for hemorrhage.  Patient was seen by tele-neurologist,  CTA head and neck was recommended but family refused secondary to patient's contrast allergy.  Patient was loaded with Keppra, EEG was completed.  No evidence of seizures.  Patient subsequently improved and now following commands.  The patient had recent hospitalization from 08/26/22 to 08/29/22 secondary to acute on chronic blood loss anemia.  Hemoccult was positive at that time.   She has multiple gastric AVMs treated with APC, small hiatal hernia, no active or stigmata of bleeding. She is not felt to be a candidate for anticoagulation due to her recurrent GI bleeds.   Assessment & Plan:   Principal Problem:   Symptomatic anemia Active Problems:   Acute on chronic blood loss anemia   Acute metabolic encephalopathy   Constipation   Abdominal wall pain in left flank   Essential hypertension   End-stage renal disease on hemodialysis (HCC)   Intestinal ischemia (HCC)   CAD (coronary artery disease)   Diabetes mellitus type 2 in nonobese [facility])   Acquired hypothyroidism   Atrial fibrillation, chronic (HCC)   Anemia of chronic disease   GI bleed    Ileus (HCC)   Periumbilical abdominal pain   Generalized abdominal pain  Acute on chronic blood loss anemia: Patient presented with generalized weakness and found to have hemoglobin 7.1. Status post 1 unit PRBC.  Hemoglobin remained stable afterwards. GI consulted.  Blood loss felt to be secondary to the patient's gastric and small bowel AVMs 11/01/22: SB enteroscopy--nonbleeding gastric ulcer with clean base; non bleeding jejunal diverticula; no AVMs She is treated with BID octreotide injections. Hb remains largely stable around  10.0 GI recommended octreotide 100 mcg twice daily as an outpatient.   Seizure-like activity/myoclonus: Patient has had generalized seizures like activity.  Patient has had similar episodes in the past. It was felt secondary to anemia and gabapentin in the setting of ESRD. Appreciate Neurology consult>>loaded keppra Continue Keppra per neurologist - no further episodes on keppra. EEG : No evidence of seizures.   Generalized Abdominal Pain: Concern for mesenteric ischemia: CT abd/pelvis - suggesting mild ileus and cholelithiasis. [place] RUQ completed for further gallbladder work up - HIDA scan not recommended by surgical team She is tolerating diet but endorses post-prandial pain - improved after enema given 4/27 Laxatives added for constipation / mild ileus  There was concern for underlying mesenteric Ischemia , poor candidate for surgical intervention, discussed with surgeon, will ask IR to evaluate if she could be a candidate for palliative stenting, Patient would need CTA study to further evaluate for mesenteric ischemia if pain returns and would likely need to be premedicated and coordinated with dialysis.   GI team was planning for CTA on Wed 5/1  with premedication (given history of contrast allergy)  but due to loss of IV access CTA canceled 5/1. GI recommended ultrasound mesentric arteries which is significant for stenosis. GI team intensified bowel regimen   Continue octreotide 100 mcg twice daily as an outpatient. Discussed with GI, general surgery recommended transfer patient to [facility] for CTA to look for bleeding source, evaluate for mesenteric arteries.  IR is consulted.  Patient may need hemodialysis following CTA.   ESRD: Continue HD on Monday, Wednesday, Friday Last hemodialysis on 5/1 Appreciate nephrology follow-up  Paroxysmal Atrial fibrillation/SVT: Not a candidate for anticoagulation secondary to recurrent GI bleed Continue amiodarone and metoprolol. Currently in sinus, but has intermittent episodes of SVT   Essential hypertension Continue home medications-metoprolol and lisinopril Well-documented that the patient has had uncontrolled hypertension improved with HD volume removal She was given extra dose of metoprolol 25 mg 4/29 after HD due to HTN and tachycardia.   Coronary Artery Disease s/p CABG in 2013 with DES to D1 in 10/2016 with cath showing patent LIMA-LAD, SVG-RI, SVG-Mrg and SVG-RCA and repeat cath in 07/2018 showing patent grafts with occlusion of D1 at prior stent site and progression of PDA disease and medical management was recommended Continue imdur, ASA 81 mg, simavastatin   Hypothyroidism Continue levothyroxine     Cognitive impairment Documented over the last several hospitalization the patient has had baseline cognitive impairment and hospital delirium She is at high risk for recurrent delirium due to her metabolic derangement and hospital stays. 10/30/2022 CT brain negative. Continue delirium precautions and frequent family visitation  .   DVT prophylaxis: SCDs Code Status: Full code Family Communication: No family at bed side. Disposition Plan:   Status is: Inpatient Remains inpatient appropriate because:    Admitted for generalized weakness, found to have anemia, underwent EGD.  Continue to have ongoing pain requiring further work up with CTA, ultrasound present regardless suspicious for  significant stenosis.  GI general surgery recommended transfer to [facility] for IR evaluation.   Consultants:  Gastroenterology Nephrology Neurology General surgery  Procedures:    Antimicrobials: Anti-infectives (From admission, onward)    Start     Dose/Rate Route Frequency Ordered Stop   11/03/22 1400  piperacillin-tazobactam (ZOSYN) 2.25 g in sodium chloride 0.9 % 50 mL IVPB  Status:  Discontinued        2.25 g 100 mL/hr over 30 Minutes Intravenous Every 8 hours 11/03/22 1025 11/08/22 1544   11/02/22 2200  piperacillin-tazobactam (ZOSYN) IVPB 2.25 g  Status:  Discontinued        2.25 g 100 mL/hr over 30 Minutes Intravenous Every 8 hours 11/02/22 1826 11/03/22 1026       Subjective: Patient was seen and examined at bedside.  Overnight events noted.   Patient reports doing better still reports having abdominal pain. She underwent ultrasound mesenteric suspected for significant stenosis.  Objective: Vitals:   11/11/22 1044 11/11/22 1350 11/11/22 1400 11/11/22 1430  BP: (!) 133/54 (!) 142/58 (!) 157/63 (!) 182/67  Pulse: (!) 53 (!) 58 (!) 58 61  Resp:  18 16 16   Temp: 97.8 F (36.6 C) 97.9 F (36.6 C)    TempSrc: Oral Oral    SpO2: 100% 98%    Weight:  59.2 kg    Height:        Intake/Output Summary (Last 24 hours) at 11/11/2022 1447 Last data filed at 11/11/2022 0900 Gross per 24 hour  Intake 480 ml  Output --  Net 480 ml  Filed Weights   11/09/22 1840 11/09/22 2253 11/11/22 1350  Weight: 60 kg 58 kg 59.2 kg    Examination:  General exam: Appears calm and comfortable, not in any acute distress. Respiratory system: Clear to auscultation. Respiratory effort normal.  RR 16 Cardiovascular system: S1 & S2 heard, RRR. No JVD, murmurs, rubs, gallops or clicks.  Gastrointestinal system: Abdomen is soft, nondistended, mildly tender, BS+ Central nervous system: Alert and oriented X 3. No focal neurological deficits. Extremities: No edema , No cyanosis, no  clubbing. Skin: No rashes, lesions or ulcers Psychiatry: Judgement and insight appear normal. Mood & affect appropriate.     Data Reviewed: I have personally reviewed following labs and imaging studies  CBC: Recent Labs  Lab 11/07/22 0955 11/09/22 0351 11/11/22 0407  WBC 7.5 9.1 7.8  HGB 9.6* 10.3* 8.8*  HCT 27.9* 30.6* 26.7*  MCV 105.7* 106.3* 108.1*  PLT 200 220 229   Basic Metabolic Panel: Recent Labs  Lab 11/05/22 0426 11/07/22 0955 11/09/22 1947 11/11/22 0407  NA 133* 131* 129* 131*  K 4.3 4.7 4.1 4.0  CL 97* 94* 90* 95*  CO2 25 21* 23 23  GLUCOSE 83 161* 198* 98  BUN 25* 53* 60* 47*  CREATININE 4.36* 7.96* 7.55* 5.56*  CALCIUM 7.6* 8.0* 8.4* 8.0*  MG  --   --   --  1.3*  PHOS 4.9* 7.7* 6.9* 4.9*   GFR: Estimated Creatinine Clearance: 7 mL/min (A) (by C-G formula based on SCr of 5.56 mg/dL (H)). Liver Function Tests: Recent Labs  Lab 11/05/22 0426 11/07/22 0955 11/09/22 1947  ALBUMIN 2.7* 3.0* 3.1*   No results for input(s): "LIPASE", "AMYLASE" in the last 168 hours. No results for input(s): "AMMONIA" in the last 168 hours. Coagulation Profile: No results for input(s): "INR", "PROTIME" in the last 168 hours. Cardiac Enzymes: No results for input(s): "CKTOTAL", "CKMB", "CKMBINDEX", "TROPONINI" in the last 168 hours. BNP (last 3 results) No results for input(s): "PROBNP" in the last 8760 hours. HbA1C: No results for input(s): "HGBA1C" in the last 72 hours. CBG: Recent Labs  Lab 11/10/22 2140  GLUCAP 165*   Lipid Profile: No results for input(s): "CHOL", "HDL", "LDLCALC", "TRIG", "CHOLHDL", "LDLDIRECT" in the last 72 hours. Thyroid Function Tests: No results for input(s): "TSH", "T4TOTAL", "FREET4", "T3FREE", "THYROIDAB" in the last 72 hours. Anemia Panel: No results for input(s): "VITAMINB12", "FOLATE", "FERRITIN", "TIBC", "IRON", "RETICCTPCT" in the last 72 hours. Sepsis Labs: No results for input(s): "PROCALCITON", "LATICACIDVEN" in the last  168 hours.  Recent Results (from the past 240 hour(s))  Culture, blood (Routine X 2) w Reflex to ID Panel     Status: None   Collection Time: 11/02/22  7:24 PM   Specimen: BLOOD  Result Value Ref Range Status   Specimen Description BLOOD BLOOD RIGHT HAND  Final   Special Requests   Final    BOTTLES DRAWN AEROBIC AND ANAEROBIC Blood Culture adequate volume   Culture   Final    NO GROWTH 5 DAYS Performed at [facility], [address] [place], [place] [address]    Report Status 11/07/2022 FINAL  Final  Culture, blood (Routine X 2) w Reflex to ID Panel     Status: None   Collection Time: 11/02/22  7:32 PM   Specimen: BLOOD  Result Value Ref Range Status   Specimen Description BLOOD BLOOD RIGHT ARM  Final   Special Requests   Final    BOTTLES DRAWN AEROBIC AND ANAEROBIC Blood Culture adequate volume  Culture   Final    NO GROWTH 5 DAYS Performed at [facility], [address], [place], [place] [address]    Report Status 11/07/2022 FINAL  Final    Radiology Studies: [place] MESENTERIC ARTERIES  Result Date: 11/11/2022 CLINICAL DATA:  Abdominal pain EXAM: [place] MESENTERIC ARTERIAL DOPPLER COMPARISON:  CT abdomen pelvis without contrast 11/02/2022 FINDINGS: Celiac axis: 141 cm/sec Unable to perform inspiration and expiration evaluation due to patient condition. Splenic artery: 175 cm/sec Hepatic artery: 45 cm/sec SMA: 315 cm/sec IMA: Not visualized Aortic size: 1.6 cm proximally, 1.2 cm in the mid segment and 1.1 cm distally IMPRESSION: Elevated peak systolic velocity in the superior mesenteric artery is suspicious for significant stenosis. Electronically Signed   By: Acquanetta Belling M.D.   On: 11/11/2022 10:25    Scheduled Meds:  amiodarone  100 mg Oral Daily   calcitRIOL  1.75 mcg Oral Q M,W,F-HD   Chlorhexidine Gluconate Cloth  6 each Topical Daily   Chlorhexidine Gluconate Cloth  6 each Topical Q0600   Chlorhexidine Gluconate Cloth  6 each Topical Q0600   Chlorhexidine Gluconate Cloth  6  each Topical Q0600   cinacalcet  30 mg Oral Q M,W,F-HD   diphenhydrAMINE  50 mg Oral Once   Or   diphenhydrAMINE  50 mg Intravenous Once   gabapentin  100 mg Oral QHS   levETIRAcetam  500 mg Oral Daily   levothyroxine  75 mcg Oral QAC breakfast   lisinopril  20 mg Oral Daily   magic mouthwash  5 mL Oral TID   metoprolol tartrate  12.5 mg Oral BID   octreotide  100 mcg Subcutaneous Q12H   polyethylene glycol  17 g Oral TID   rosuvastatin  10 mg Oral Daily   sevelamer carbonate  800 mg Oral TID WC   Continuous Infusions:  anticoagulant sodium citrate     magnesium sulfate bolus IVPB       LOS: 13 days    Time spent: 35 mins    Willeen Niece, MD [facility]   If 7PM-7AM, please contact night-coverage

## 2022-11-11 NOTE — Progress Notes (Signed)
IV Team unable to obtain access for CT Angio. Dr. Antionette Char notified via secure chat and endorsed access options would be deferred to day team. Will continue to monitor.

## 2022-11-11 NOTE — Care Management Important Message (Signed)
Important Message  Patient Details  Name: Shawna [name] MRN: [id number] Date of Birth: Jan 12, 1941   Medicare Important Message Given:  Yes     [name] 11/11/2022, 10:41 AM

## 2022-11-12 ENCOUNTER — Encounter (HOSPITAL_COMMUNITY): Payer: Self-pay | Admitting: Internal Medicine

## 2022-11-12 ENCOUNTER — Inpatient Hospital Stay (HOSPITAL_COMMUNITY): Payer: Medicare Other

## 2022-11-12 DIAGNOSIS — D649 Anemia, unspecified: Secondary | ICD-10-CM | POA: Diagnosis not present

## 2022-11-12 DIAGNOSIS — N186 End stage renal disease: Secondary | ICD-10-CM | POA: Diagnosis not present

## 2022-11-12 DIAGNOSIS — I10 Essential (primary) hypertension: Secondary | ICD-10-CM | POA: Diagnosis not present

## 2022-11-12 DIAGNOSIS — K559 Vascular disorder of intestine, unspecified: Secondary | ICD-10-CM | POA: Diagnosis not present

## 2022-11-12 HISTORY — PX: IR FLUORO GUIDE CV LINE RIGHT: IMG2283

## 2022-11-12 HISTORY — PX: IR US GUIDE VASC ACCESS RIGHT: IMG2390

## 2022-11-12 MED ORDER — DIPHENHYDRAMINE HCL 25 MG PO CAPS
50.0000 mg | ORAL_CAPSULE | Freq: Once | ORAL | Status: AC
  Filled 2022-11-12: qty 2

## 2022-11-12 MED ORDER — DIPHENHYDRAMINE HCL 50 MG/ML IJ SOLN
50.0000 mg | Freq: Once | INTRAMUSCULAR | Status: AC
  Administered 2022-11-13: 50 mg via INTRAMUSCULAR
  Filled 2022-11-12: qty 1

## 2022-11-12 MED ORDER — LIDOCAINE HCL 1 % IJ SOLN
INTRAMUSCULAR | Status: AC
  Filled 2022-11-12: qty 20

## 2022-11-12 MED ORDER — LIDOCAINE HCL (PF) 1 % IJ SOLN
10.0000 mL | Freq: Once | INTRAMUSCULAR | Status: DC
  Filled 2022-11-12: qty 10

## 2022-11-12 MED ORDER — PREDNISONE 5 MG PO TABS
50.0000 mg | ORAL_TABLET | Freq: Two times a day (BID) | ORAL | Status: AC
  Administered 2022-11-12 – 2022-11-13 (×2): 50 mg via ORAL
  Filled 2022-11-12 (×2): qty 2

## 2022-11-12 NOTE — Plan of Care (Signed)

## 2022-11-12 NOTE — Plan of Care (Signed)
Brief IR note:  Request to IR for IV access, previously with PIV at [facility] however this was lost and has been unable to be re-established despite multiple attempts. She is HD dependent, currently has left IJ tunneled HD cath. IR also asked to evaluate for possible mesenteric stenting and it was recommended to obtain CTA which was ordered while at [facility] however patient with contrast allergy requiring 13 hour prep, subsequently lost IV access and appeared to be improving so this study was held. She has continued to have intermittent abdominal pain and US of the mesenteric arteries yesterday suspicious for significant SMA stenosis given elevated peak systolic velocity. Primary team at [facility] planning for CTA after 13 hour prep.  Plan: - Temporary CVC  placement today by Dr. Grace Isaac, likely will be femoral which will mean patient must be on bedrest. Discussed with primary team who is agreeable. Discussed procedure with patient's husband Shawna [name] via phone who gives verbal consent to proceed. - CTA for evaluation of SMA after 13 hour contrast allergy prep (Prednisone 50 mg PO 13 hours, 7 hours, 1 hour prior to study and Benadryl 50 mg PO 1 hour prior to exam. Medications ordered by primary team, RN to coordinate timing of prep with CT) - IR will review results of CTA and provide further recommendations  Please call on call IR MD (Dr. Grace Isaac) with questions or concerns.  Lynnette Caffey, PA-C

## 2022-11-12 NOTE — Progress Notes (Signed)
Spoke with ct and they said not to start pre med for cta until line obtained.

## 2022-11-12 NOTE — Progress Notes (Addendum)
PROGRESS NOTE        PATIENT DETAILS Name: Shawna [name] Age: 82 y.o. Sex: female Date of Birth: Nov 23, 1940 Admit Date: 10/28/2022 Admitting Physician Joseph Art, DO HCW:[id number], [facility]  Brief Summary: Patient is a 82 y.o.  female with history of ESRD on HD MWF, CAD s/p CABG, HTN, HLD, PAF, recurrent GI bleeding due to AVMs-who was initially admitted to [facility] on 4/19 with weakness-Hb showed hemoglobin of 7.1.  Patient was transfused PRBC-evaluated by GI-underwent small bowel enteroscopy which showed nonbleeding gastric ulcer, further hospital course complicated by seizures, and worsening abdominal pain with concern for chronic mesenteric ischemia.  While at [facility]-patient loss IV access-and any IV access was unable to be obtained, hence patient was unable to get a CT angiogram of the abdomen.  Ultimately, patient was  transferred to [facility] on 5/3 for placement of IV access ( CT angiogram abdomen) and IR evaluation.  See below for further details.  Significant events: 4/19>> admit to [facility] at [facility] for anemia/weakness. 4/21>> AMS-seizure-like activity-Keppra started-neurology consulted 4/23>> small bowel enteroscopy 5/03>> transferred to [facility]  Significant studies: 4/21>> CT head: No ICH/infarct.  Subdural hygromas overlying both cerebral hemispheres. 4/22>> EEG: No seizures 4/22>> MRI brain: No CVA-redemonstrated left> right subdural hygromas 4/24>> CT abdomen/pelvis no contrast: Ileus versus mild partial obstruction, distended gallbladder. 4/25>> RUQ ultrasound: No biliary ductal dilatation-gallbladder wall is thickened-no gallbladder stones.  Negative Murphy sign. 5/03>> [place] mesenteric arteries: Elevated peak systolic velocity in the superior mesenteric artery suspicious for significant stenosis.  Significant microbiology data: 4/24>> blood culture: No growth  Procedures: 4/23>> small bowel enteroscopy: Nonbleeding gastric ulcer with clean base, nonbleeding  duodenal diverticula, jejunal polyp.  Consults: GI General surgery Neurology IR Nephrology  Subjective: Lying comfortably in bed-denies any chest pain or shortness of breath.  Objective: Vitals: Blood pressure (!) 149/59, pulse (!) 54, temperature 98.4 F (36.9 C), temperature source Oral, resp. rate 20, height 5\' 2"  (1.575 m), weight 57.1 kg, SpO2 96 %.   Exam: Gen Exam:Alert awake-not in any distress HEENT:atraumatic, normocephalic Chest: B/L clear to auscultation anteriorly CVS:S1S2 regular Abdomen:soft non tender, non distended Extremities:no edema Neurology: Non focal Skin: no rash  Pertinent Labs/Radiology:    Latest Ref Rng & Units 11/11/2022    4:07 AM 11/09/2022    3:51 AM 11/07/2022    9:55 AM  CBC  WBC 4.0 - 10.5 K/uL 7.8  9.1  7.5   Hemoglobin 12.0 - 15.0 g/dL 8.8  51.7  9.6   Hematocrit 36.0 - 46.0 % 26.7  30.6  27.9   Platelets 150 - 400 K/uL 229  220  200     Lab Results  Component Value Date   NA 131 (L) 11/11/2022   K 4.0 11/11/2022   CL 95 (L) 11/11/2022   CO2 23 11/11/2022      Assessment/Plan: Recurrent GI bleeding with acute on chronic blood loss anemia Hb now stable-required 1 unit of PRBC on 4/20 Small bowel enteroscopy as above Continue octreotide-per GI-this will need to be continued on discharge  Seizure disorder No further seizures Evaluated by neuro at [facility]-continue Keppra Neuroimaging as above  Worsening generalized abdominal pain-possible chronic mesenteric ischemia No abdominal pain this morning-had some abdominal pain yesterday.  Abdominal exam is benign. Followed closely by GI/general surgery at [facility]-patient lost IV access several days back- and hence unable to  perform CT angio at [facility]. Discussed this morning with IR MD Dr. Almyra Free will place central venous access-CT angio abdomen will be subsequently obtained (contrast allergy-prednisone/Benadryl already ordered by prior MD on 5/3)-following which IR will provide further  recommendations.  ESRD on HD MWF Nephrology at [facility] made aware of patient's transfer HD per nephrology  PAF/history of SVT Sinus rhythm Continue amiodarone/metoprolol Not a candidate for anticoagulation due to recurrent GI bleeding  HTN BP relatively stable Continue lisinopril/metoprolol  HLD Statin  CAD s/p CABG/PCI No anginal symptoms ASA on hold due to GI bleeding/anemia Continue statin/beta-blocker  Hypothyroidism Continue Synthroid  Cognitive impairment Per prior hospitalist-this is a chronic issue Maintain delirium precautions  BMI: Estimated body mass index is 23.02 kg/m as calculated from the following:   Height as of this encounter: 5\' 2"  (1.575 m).   Weight as of this encounter: 57.1 kg.   Code status:   Code Status: Full Code   DVT Prophylaxis: SCDs Start: 10/29/22 0707   Family Communication:  Daughter-Mareka [phone]-left voicemail on 5/4  Addendum-daughter called back-Home number-[phone].  Updated daughter in detail over the phone.  Disposition Plan: Status is: Inpatient Remains inpatient appropriate because: Severity of illness   Planned Discharge Destination:Home health   Diet: Diet Order             DIET SOFT Room service appropriate? Yes; Fluid consistency: Thin  Diet effective now                     Antimicrobial agents: Anti-infectives (From admission, onward)    Start     Dose/Rate Route Frequency Ordered Stop   11/03/22 1400  piperacillin-tazobactam (ZOSYN) 2.25 g in sodium chloride 0.9 % 50 mL IVPB  Status:  Discontinued        2.25 g 100 mL/hr over 30 Minutes Intravenous Every 8 hours 11/03/22 1025 11/08/22 1544   11/02/22 2200  piperacillin-tazobactam (ZOSYN) IVPB 2.25 g  Status:  Discontinued        2.25 g 100 mL/hr over 30 Minutes Intravenous Every 8 hours 11/02/22 1826 11/03/22 1026        MEDICATIONS: Scheduled Meds:  amiodarone  100 mg Oral Daily   calcitRIOL  1.75 mcg Oral Q M,W,F-HD    Chlorhexidine Gluconate Cloth  6 each Topical Daily   cinacalcet  30 mg Oral Q M,W,F-HD   diphenhydrAMINE  50 mg Oral Once   Or   diphenhydrAMINE  50 mg Intravenous Once   diphenhydrAMINE  50 mg Oral Once   gabapentin  100 mg Oral QHS   levETIRAcetam  500 mg Oral Daily   levothyroxine  75 mcg Oral QAC breakfast   lisinopril  20 mg Oral Daily   magic mouthwash  5 mL Oral TID   metoprolol tartrate  12.5 mg Oral BID   octreotide  100 mcg Subcutaneous Q12H   polyethylene glycol  17 g Oral TID   predniSONE  50 mg Oral Q6H   rosuvastatin  10 mg Oral Daily   sevelamer carbonate  800 mg Oral TID WC   Continuous Infusions:  anticoagulant sodium citrate     PRN Meds:.ALPRAZolam, alteplase, anticoagulant sodium citrate, dicyclomine, guaiFENesin-dextromethorphan, heparin, hydrALAZINE, HYDROmorphone (DILAUDID) injection, lidocaine (PF), lidocaine-prilocaine, ondansetron **OR** ondansetron (ZOFRAN) IV, oxyCODONE, pentafluoroprop-tetrafluoroeth   I have personally reviewed following labs and imaging studies  LABORATORY DATA: CBC: Recent Labs  Lab 11/07/22 0955 11/09/22 0351 11/11/22 0407  WBC 7.5 9.1 7.8  HGB 9.6* 10.3* 8.8*  HCT 27.9* 30.6*  26.7*  MCV 105.7* 106.3* 108.1*  PLT 200 220 229    Basic Metabolic Panel: Recent Labs  Lab 11/07/22 0955 11/09/22 1947 11/11/22 0407  NA 131* 129* 131*  K 4.7 4.1 4.0  CL 94* 90* 95*  CO2 21* 23 23  GLUCOSE 161* 198* 98  BUN 53* 60* 47*  CREATININE 7.96* 7.55* 5.56*  CALCIUM 8.0* 8.4* 8.0*  MG  --   --  1.3*  PHOS 7.7* 6.9* 4.9*    GFR: Estimated Creatinine Clearance: 6.2 mL/min (A) (by C-G formula based on SCr of 5.56 mg/dL (H)).  Liver Function Tests: Recent Labs  Lab 11/07/22 0955 11/09/22 1947  ALBUMIN 3.0* 3.1*   No results for input(s): "LIPASE", "AMYLASE" in the last 168 hours. No results for input(s): "AMMONIA" in the last 168 hours.  Coagulation Profile: No results for input(s): "INR", "PROTIME" in the last 168  hours.  Cardiac Enzymes: No results for input(s): "CKTOTAL", "CKMB", "CKMBINDEX", "TROPONINI" in the last 168 hours.  BNP (last 3 results) No results for input(s): "PROBNP" in the last 8760 hours.  Lipid Profile: No results for input(s): "CHOL", "HDL", "LDLCALC", "TRIG", "CHOLHDL", "LDLDIRECT" in the last 72 hours.  Thyroid Function Tests: No results for input(s): "TSH", "T4TOTAL", "FREET4", "T3FREE", "THYROIDAB" in the last 72 hours.  Anemia Panel: No results for input(s): "VITAMINB12", "FOLATE", "FERRITIN", "TIBC", "IRON", "RETICCTPCT" in the last 72 hours.  Urine analysis:    Component Value Date/Time   COLORURINE YELLOW 12/16/2012 1919   APPEARANCEUR CLOUDY (A) 12/16/2012 1919   LABSPEC 1.009 12/16/2012 1919   PHURINE 7.5 12/16/2012 1919   GLUCOSEU NEGATIVE 12/16/2012 1919   HGBUR TRACE (A) 12/16/2012 1919   BILIRUBINUR NEGATIVE 12/16/2012 1919   KETONESUR NEGATIVE 12/16/2012 1919   PROTEINUR 100 (A) 12/16/2012 1919   UROBILINOGEN 0.2 12/16/2012 1919   NITRITE NEGATIVE 12/16/2012 1919   LEUKOCYTESUR SMALL (A) 12/16/2012 1919    Sepsis Labs: Lactic Acid, Venous    Component Value Date/Time   LATICACIDVEN 1.3 08/26/2022 1736    MICROBIOLOGY: Recent Results (from the past 240 hour(s))  Culture, blood (Routine X 2) w Reflex to ID Panel     Status: None   Collection Time: 11/02/22  7:24 PM   Specimen: BLOOD  Result Value Ref Range Status   Specimen Description BLOOD BLOOD RIGHT HAND  Final   Special Requests   Final    BOTTLES DRAWN AEROBIC AND ANAEROBIC Blood Culture adequate volume   Culture   Final    NO GROWTH 5 DAYS Performed at [facility], [address] [place], [place] [address]    Report Status 11/07/2022 FINAL  Final  Culture, blood (Routine X 2) w Reflex to ID Panel     Status: None   Collection Time: 11/02/22  7:32 PM   Specimen: BLOOD  Result Value Ref Range Status   Specimen Description BLOOD BLOOD RIGHT ARM  Final   Special Requests   Final     BOTTLES DRAWN AEROBIC AND ANAEROBIC Blood Culture adequate volume   Culture   Final    NO GROWTH 5 DAYS Performed at [facility], [address], [place], [place] [address]    Report Status 11/07/2022 FINAL  Final    RADIOLOGY STUDIES/RESULTS: [place] MESENTERIC ARTERIES  Result Date: 11/11/2022 CLINICAL DATA:  Abdominal pain EXAM: [place] MESENTERIC ARTERIAL DOPPLER COMPARISON:  CT abdomen pelvis without contrast 11/02/2022 FINDINGS: Celiac axis: 141 cm/sec Unable to perform inspiration and expiration evaluation due to patient condition. Splenic artery: 175  cm/sec Hepatic artery: 45 cm/sec SMA: 315 cm/sec IMA: Not visualized Aortic size: 1.6 cm proximally, 1.2 cm in the mid segment and 1.1 cm distally IMPRESSION: Elevated peak systolic velocity in the superior mesenteric artery is suspicious for significant stenosis. Electronically Signed   By: Acquanetta Belling M.D.   On: 11/11/2022 10:25     LOS: 14 days   Jeoffrey Massed, MD  [facility]    To contact the attending provider between 7A-7P or the covering provider during after hours 7P-7A, please log into the web site [email or web address] and access using universal [place] password for that web site. If you do not have the password, please call the hospital operator.  11/12/2022, 9:46 AM

## 2022-11-12 NOTE — Procedures (Signed)
Pre procedural Diagnosis: Poor venous access Post Procedural Diagnosis: Same  Successful placement of right common femoral vein approach dual lumen non-tunneled CVC line with tip at the inferior caval-atrial junction.    EBL: None  No immediate post procedural complication.  The non tunneled CVC is ready for immediate use.  Katherina Right, MD Pager #: [phone]

## 2022-11-12 NOTE — Progress Notes (Signed)
[facility] NEPHROLOGY PROGRESS NOTE  Subjective: Seen and examined at bedside.  No new event.  Denies any nausea, vomiting, chest pain, shortness of breath.   Objective Vital signs in last 24 hours: Vitals:   11/12/22 0834 11/12/22 1238 11/12/22 1600 11/12/22 1956  BP:   (!) 152/57 (!) 170/67  Pulse:    62  Resp:    19  Temp:   98 F (36.7 C)   TempSrc: Oral Oral Oral Oral  SpO2:   96%   Weight:      Height:        Physical Exam: General:NAD, comfortable Heart:RRR, s1s2 nl Lungs:clear b/l, no crackle Abdomen:soft, Non-tender, non-distended Extremities:No edema Dialysis Access: Left IJ TDC in place.  OP HD: MWF [facility]  Needs updating (jan 2024) --> 3.5h  59.5kg  bfr 300  2/2.5 bath LIJ TDC    Home meds - xanax, amiodarone, synthroid, metoprolol 12.5 bid, midodrine 5mg  tid and pre hd tiw, renavite, prilosec, crestor, renvela 4 ac tid, ultram, lipitor, gabapentin, lisinopril 20 mg qd, prns/ vits/ supps    Summary: Pt is a 82 y.o. yo female with history of HTN, CAD status post CABG, HLD, A-fib, GI bleed, ESRD on HD presented with generalized weakness/abdomen pain, following to manage ESRD.    Assessment/ Plan: Recurrent GI bleed w/ ABL anemia - likely GI source, required transfusion.  EGD/enteroscopy during this admission showing nonbleeding gastric ulcer, duodenal polyp sp biopsy. Getting octreotide.  Abd pain - possible mesenteric ischemia. Tx'd to [facility] for IR services to get IV access for a CT angio abdomen.  ESRD - MWF at [facility]. Last HD was Friday 5/3 at [facility]. Next HD Monday at [facility]. L IJ TDC for dialysis. MBD ckd - is on calcitriol, Cinacalcet and just resumed Renvela. Follow.  HTN/volume- UF with HD and continue current antihypertensives. Afib - on amio, BB CAD h/o CABG Hypothyroidism Cognitive impairment - chronic issue  Vinson Moselle, MD 11/12/2022, 8:04 PM    Recent Labs  Lab 11/07/22 0955 11/09/22 0351 11/09/22 1947 11/11/22 0407  HGB  9.6* 10.3*  --  8.8*  ALBUMIN 3.0*  --  3.1*  --   CALCIUM 8.0*  --  8.4* 8.0*  PHOS 7.7*  --  6.9* 4.9*  CREATININE 7.96*  --  7.55* 5.56*  K 4.7  --  4.1 4.0    Inpatient medications:  amiodarone  100 mg Oral Daily   calcitRIOL  1.75 mcg Oral Q M,W,F-HD   Chlorhexidine Gluconate Cloth  6 each Topical Daily   cinacalcet  30 mg Oral Q M,W,F-HD   diphenhydrAMINE  50 mg Oral Once   Or   diphenhydrAMINE  50 mg Intravenous Once   diphenhydrAMINE  50 mg Oral Once   [START ON 11/13/2022] diphenhydrAMINE  50 mg Oral Once   Or   [START ON 11/13/2022] diphenhydrAMINE  50 mg Intramuscular Once   gabapentin  100 mg Oral QHS   levETIRAcetam  500 mg Oral Daily   levothyroxine  75 mcg Oral QAC breakfast   lidocaine (PF)  10 mL Infiltration Once   lisinopril  20 mg Oral Daily   magic mouthwash  5 mL Oral TID   metoprolol tartrate  12.5 mg Oral BID   octreotide  100 mcg Subcutaneous Q12H   polyethylene glycol  17 g Oral TID   predniSONE  50 mg Oral Q6H   rosuvastatin  10 mg Oral Daily   sevelamer carbonate  800 mg Oral TID WC  anticoagulant sodium citrate     ALPRAZolam, alteplase, anticoagulant sodium citrate, dicyclomine, guaiFENesin-dextromethorphan, heparin, hydrALAZINE, HYDROmorphone (DILAUDID) injection, lidocaine (PF), lidocaine-prilocaine, ondansetron **OR** ondansetron (ZOFRAN) IV, oxyCODONE, pentafluoroprop-tetrafluoroeth

## 2022-11-12 NOTE — Progress Notes (Signed)
Patient off the floor for central line placement in IR.

## 2022-11-13 DIAGNOSIS — D649 Anemia, unspecified: Secondary | ICD-10-CM | POA: Diagnosis not present

## 2022-11-13 LAB — CBC
HCT: 27.5 % — ABNORMAL LOW (ref 36.0–46.0)
Hemoglobin: 9.2 g/dL — ABNORMAL LOW (ref 12.0–15.0)
MCH: 35.2 pg — ABNORMAL HIGH (ref 26.0–34.0)
MCHC: 33.5 g/dL (ref 30.0–36.0)
MCV: 105.4 fL — ABNORMAL HIGH (ref 80.0–100.0)
Platelets: 271 10*3/uL (ref 150–400)
RBC: 2.61 MIL/uL — ABNORMAL LOW (ref 3.87–5.11)
RDW: 24.5 % — ABNORMAL HIGH (ref 11.5–15.5)
WBC: 13 10*3/uL — ABNORMAL HIGH (ref 4.0–10.5)
nRBC: 0 % (ref 0.0–0.2)

## 2022-11-13 LAB — RENAL FUNCTION PANEL
Albumin: 2.9 g/dL — ABNORMAL LOW (ref 3.5–5.0)
Anion gap: 12 (ref 5–15)
BUN: 45 mg/dL — ABNORMAL HIGH (ref 8–23)
CO2: 22 mmol/L (ref 22–32)
Calcium: 8.6 mg/dL — ABNORMAL LOW (ref 8.9–10.3)
Chloride: 94 mmol/L — ABNORMAL LOW (ref 98–111)
Creatinine, Ser: 5.78 mg/dL — ABNORMAL HIGH (ref 0.44–1.00)
GFR, Estimated: 7 mL/min — ABNORMAL LOW (ref >60)
Glucose, Bld: 182 mg/dL — ABNORMAL HIGH (ref 70–99)
Phosphorus: 5.3 mg/dL — ABNORMAL HIGH (ref 2.5–4.6)
Potassium: 5.2 mmol/L — ABNORMAL HIGH (ref 3.5–5.1)
Sodium: 128 mmol/L — ABNORMAL LOW (ref 135–145)

## 2022-11-13 LAB — GLUCOSE, CAPILLARY: Glucose-Capillary: 229 mg/dL — ABNORMAL HIGH (ref 70–99)

## 2022-11-13 LAB — MAGNESIUM: Magnesium: 1.6 mg/dL — ABNORMAL LOW (ref 1.7–2.4)

## 2022-11-13 MED ORDER — CHLORHEXIDINE GLUCONATE CLOTH 2 % EX PADS: 6.0000 | MEDICATED_PAD | Freq: Every day | CUTANEOUS | Status: DC

## 2022-11-13 MED ORDER — MAGNESIUM SULFATE 2 GM/50ML IV SOLN
2.0000 g | Freq: Once | INTRAVENOUS | Status: AC
  Administered 2022-11-13: 2 g via INTRAVENOUS
  Filled 2022-11-13: qty 50

## 2022-11-13 MED ORDER — HYDRALAZINE HCL 50 MG PO TABS
100.0000 mg | ORAL_TABLET | Freq: Three times a day (TID) | ORAL | Status: DC
  Administered 2022-11-13 – 2022-11-15 (×7): 100 mg via ORAL
  Filled 2022-11-13 (×7): qty 2

## 2022-11-13 MED ORDER — SODIUM ZIRCONIUM CYCLOSILICATE 10 G PO PACK
10.0000 g | PACK | Freq: Once | ORAL | Status: AC
  Administered 2022-11-13: 10 g via ORAL
  Filled 2022-11-13: qty 1

## 2022-11-13 MED ORDER — IOHEXOL 350 MG/ML SOLN
80.0000 mL | Freq: Once | INTRAVENOUS | Status: AC | PRN
  Administered 2022-11-13: 80 mL via INTRAVENOUS

## 2022-11-13 NOTE — Evaluation (Signed)
Physical Therapy Evaluation Patient Details Name: Shawna [name] MRN: [id number] DOB: 1940-11-20 Today's Date: 11/13/2022  History of Present Illness  Pt is 82 yo female admitted to [facility]  on 10/28/22 with weakness and Hgb of 7.1.   Patient was transfused PRBC-evaluated by GI-underwent small bowel enteroscopy which showed nonbleeding gastric ulcer, further hospital course complicated by seizures, and worsening abdominal pain with concern for chronic mesenteric ischemia.  While at [facility]-patient loss IV access. Ultimately, patient was  transferred to [facility] on 5/3 for placement of IV access and is s/p R femoral non-tunneled CVC on 11/12/22. She has hx including but not limited to ESRD on HD MWF, CAD , CABG, HTN, HLD, PAF, recurrent GI bleeds.  Clinical Impression  Pt admitted with above diagnosis. At baseline, pt resides with spouse and ambulatory with her hurri-cane.  Spouse assist as needed for ADLs and mobility.  Today, despite being hospitalized 16 days, pt was able to transfer with min guard/min A level.  She ambulated 30' in room with RW and min guard/min A for safety.  Pt did have elevated BP so distance was limited.  Pt has DME and home support.  Agreeable to HHPT at d/c.  Pt currently with functional limitations due to the deficits listed below (see PT Problem List). Pt will benefit from acute skilled PT to increase their independence and safety with mobility to allow discharge.          Recommendations for follow up therapy are one component of a multi-disciplinary discharge planning process, led by the attending physician.  Recommendations may be updated based on patient status, additional functional criteria and insurance authorization.  Follow Up Recommendations       Assistance Recommended at Discharge Frequent or constant Supervision/Assistance  Patient can return home with the following  A little help with walking and/or transfers;A little help with bathing/dressing/bathroom;Assistance  with cooking/housework    Equipment Recommendations None recommended by PT  Recommendations for Other Services       Functional Status Assessment Patient has had a recent decline in their functional status and demonstrates the ability to make significant improvements in function in a reasonable and predictable amount of time.     Precautions / Restrictions Precautions Precautions: Fall      Mobility  Bed Mobility Overal bed mobility: Needs Assistance Bed Mobility: Supine to Sit, Sit to Supine     Supine to sit: Min guard Sit to supine: Min guard   General bed mobility comments: Increased time but no physical assist    Transfers Overall transfer level: Needs assistance Equipment used: Rolling walker (2 wheels) Transfers: Sit to/from Stand Sit to Stand: Min guard           General transfer comment: Increased time and min guard for safety    Ambulation/Gait Ambulation/Gait assistance: Min assist Gait Distance (Feet): 30 Feet Assistive device: Rolling walker (2 wheels) Gait Pattern/deviations: Step-to pattern, Decreased stride length, Trunk flexed Gait velocity: decreased     General Gait Details: Min A for RW with turns; cues to stay close to RW; distance limited by therapist due to elevated BP  Stairs            Wheelchair Mobility    Modified Rankin (Stroke Patients Only)       Balance Overall balance assessment: Needs assistance Sitting-balance support: Bilateral upper extremity supported Sitting balance-Leahy Scale: Poor Sitting balance - Comments: Min guard at most times but with fatigue begins leaning to the right requiring min A,  could correct when cued   Standing balance support: Bilateral upper extremity supported Standing balance-Leahy Scale: Poor Standing balance comment: RW and min guard/min A                             Pertinent Vitals/Pain Pain Assessment Pain Assessment: No/denies pain    Home Living  Family/patient expects to be discharged to:: Private residence Living Arrangements: Spouse/significant other Available Help at Discharge: Family;Available 24 hours/day Type of Home: House Home Access: Stairs to enter Entrance Stairs-Rails: Right Entrance Stairs-Number of Steps: 2   Home Layout: One level Home Equipment: Rollator (4 wheels);Cane - single point;Shower seat;BSC/3in1      Prior Function Prior Level of Function : Needs assist             Mobility Comments: Pt typically uses her hurri-cane to ambulate in house but will occasionally use rollator.  Spouse assist with stairs. ADLs Comments: Spouse provides min A for ADLs and total assist for IADLs     Hand Dominance   Dominant Hand: Right    Extremity/Trunk Assessment   Upper Extremity Assessment Upper Extremity Assessment: Defer to OT evaluation    Lower Extremity Assessment Lower Extremity Assessment: LLE deficits/detail;RLE deficits/detail RLE Deficits / Details: ROM WFL; MMT: ankle 5/5, knee and hip at least 3/5 but not further assessed due to femoral line LLE Deficits / Details: ROM WFL; MMT 5/5    Cervical / Trunk Assessment Cervical / Trunk Assessment: Normal  Communication   Communication: No difficulties  Cognition Arousal/Alertness: Awake/alert Behavior During Therapy: WFL for tasks assessed/performed Overall Cognitive Status: History of cognitive impairments - at baseline                                 General Comments: Overall followed commands and oriented, but did have episodes of confused time line and short term memory loss. Family reports close to baseline        General Comments General comments (skin integrity, edema, etc.): Pt with elevated BP (has been elevated).  BP sitting EOB 196/78 after return to supine 185/82. Notified RN    Exercises     Assessment/Plan    PT Assessment Patient needs continued PT services  PT Problem List Decreased strength;Cardiopulmonary  status limiting activity;Decreased range of motion;Decreased activity tolerance;Decreased balance;Decreased mobility;Decreased knowledge of precautions;Decreased cognition;Decreased knowledge of use of DME       PT Treatment Interventions DME instruction;Gait training;Balance training;Stair training;Functional mobility training;Therapeutic activities;Patient/family education;Modalities;Therapeutic exercise    PT Goals (Current goals can be found in the Care Plan section)  Acute Rehab PT Goals Patient Stated Goal: return home PT Goal Formulation: With patient/family Time For Goal Achievement: 11/27/22 Potential to Achieve Goals: Good    Frequency Min 3X/week     Co-evaluation PT/OT/SLP Co-Evaluation/Treatment: Yes Reason for Co-Treatment: Complexity of the patient's impairments (multi-system involvement);For patient/therapist safety (no tech , 16 days hospitalized)           AM-PAC PT "6 Clicks" Mobility  Outcome Measure Help needed turning from your back to your side while in a flat bed without using bedrails?: A Little Help needed moving from lying on your back to sitting on the side of a flat bed without using bedrails?: A Little Help needed moving to and from a bed to a chair (including a wheelchair)?: A Little Help needed standing up from a chair using your  arms (e.g., wheelchair or bedside chair)?: A Little Help needed to walk in hospital room?: A Little Help needed climbing 3-5 steps with a railing? : A Little 6 Click Score: 18    End of Session Equipment Utilized During Treatment: Gait belt Activity Tolerance: Patient tolerated treatment well Patient left: in bed;with call bell/phone within reach;with family/visitor present;with bed alarm set Nurse Communication: Mobility status PT Visit Diagnosis: Other abnormalities of gait and mobility (R26.89);Muscle weakness (generalized) (M62.81)    Time: 1610-9604 PT Time Calculation (min) (ACUTE ONLY): 29 min   Charges:    PT Evaluation $PT Eval Low Complexity: 1 Low          Shawna [name], PT Acute Rehab [facility] [phone]   Rayetta Humphrey 11/13/2022, 3:51 PM

## 2022-11-13 NOTE — Progress Notes (Signed)
PROGRESS NOTE        PATIENT DETAILS Name: Shawna [name] Age: 82 y.o. Sex: female Date of Birth: 08-30-1940 Admit Date: 10/28/2022 Admitting Physician Joseph Art, DO BJY:[id number], [facility]  Brief Summary: Patient is a 82 y.o.  female with history of ESRD on HD MWF, CAD s/p CABG, HTN, HLD, PAF, recurrent GI bleeding due to AVMs-who was initially admitted to [facility] on 4/19 with weakness-Hb showed hemoglobin of 7.1.  Patient was transfused PRBC-evaluated by GI-underwent small bowel enteroscopy which showed nonbleeding gastric ulcer, further hospital course complicated by seizures, and worsening abdominal pain with concern for chronic mesenteric ischemia.  While at [facility]-patient loss IV access-and any IV access was unable to be obtained, hence patient was unable to get a CT angiogram of the abdomen.  Ultimately, patient was  transferred to [facility] on 5/3 for placement of IV access ( CT angiogram abdomen) and IR evaluation.  See below for further details.  Significant events: 4/19>> admit to [facility] for anemia/weakness. 4/21>> AMS-seizure-like activity-Keppra started-neurology consulted 4/23>> small bowel enteroscopy 5/03>> transferred to [facility]  Significant studies: 4/21>> CT head: No ICH/infarct.  Subdural hygromas overlying both cerebral hemispheres. 4/22>> EEG: No seizures 4/22>> MRI brain: No CVA-redemonstrated left> right subdural hygromas 4/24>> CT abdomen/pelvis no contrast: Ileus versus mild partial obstruction, distended gallbladder. 4/25>> RUQ ultrasound: No biliary ductal dilatation-gallbladder wall is thickened-no gallbladder stones.  Negative Murphy sign. 5/03>> [place] mesenteric arteries: Elevated peak systolic velocity in the superior mesenteric artery suspicious for significant stenosis.  Significant microbiology data: 4/24>> blood culture: No growth  Procedures: 4/23>> small bowel enteroscopy: Nonbleeding gastric ulcer with clean base, nonbleeding  duodenal diverticula, jejunal polyp.  Consults: GI General surgery Neurology IR Nephrology  Subjective:   Patient in bed, appears comfortable, denies any headache, no fever, no chest pain or pressure, no shortness of breath , no abdominal pain. No new focal weakness.   Objective: Vitals: Blood pressure (!) 156/76, pulse (!) 55, temperature 98.1 F (36.7 C), temperature source Oral, resp. rate 18, height 5\' 2"  (1.575 m), weight 57.1 kg, SpO2 97 %.   Exam:  Awake Alert, No new F.N deficits, Normal affect [place].AT,PERRAL Supple Neck, No JVD,   Symmetrical Chest wall movement, Good air movement bilaterally, CTAB RRR,No Gallops, Rubs or new Murmurs,  +ve B.Sounds, Abd Soft, No tenderness,   No Cyanosis, Clubbing or edema    Assessment/Plan: Recurrent GI bleeding with acute on chronic blood loss anemia Hb now stable-required 1 unit of PRBC on 4/20 Small bowel enteroscopy as above Continue octreotide-per GI-this will need to be continued on discharge  Seizure disorder No further seizures Evaluated by neuro at [facility]-continue Keppra Neuroimaging as above  Worsening generalized abdominal pain-possible chronic mesenteric ischemia CTA done, case discussed with Dr. Grace Isaac IR, patient is completely symptom-free currently, abdominal exam is benign, for now outpatient follow-up with IR and 1 to 2 weeks, will plan for discharge, right femoral catheter to be removed, close to discharge.  Will consult IR for the same.  ESRD on HD MWF Nephrology at [facility] made aware of patient's transfer HD per nephrology  PAF/history of SVT Sinus rhythm Continue amiodarone/metoprolol Not a candidate for anticoagulation due to recurrent GI bleeding  HTN BP relatively stable Continue lisinopril/metoprolol  HLD Statin  CAD s/p CABG/PCI No anginal symptoms ASA on hold due to GI bleeding/anemia Continue statin/beta-blocker  Hypothyroidism Continue Synthroid  Cognitive impairment Per prior  hospitalist-this is a chronic issue Maintain delirium precautions  BMI: Estimated body mass index is 23.02 kg/m as calculated from the following:   Height as of this encounter: 5\' 2"  (1.575 m).   Weight as of this encounter: 57.1 kg.   Code status:   Code Status: Full Code   DVT Prophylaxis: SCDs Start: 10/29/22 0707   Family Communication:  Daughter-Mareka [phone]- on 5/4    Disposition Plan: Status is: Inpatient Remains inpatient appropriate because: Severity of illness   Planned Discharge Destination:Home health   Diet: Diet Order             DIET SOFT Room service appropriate? Yes; Fluid consistency: Thin  Diet effective now                   MEDICATIONS: Scheduled Meds:  amiodarone  100 mg Oral Daily   calcitRIOL  1.75 mcg Oral Q M,W,F-HD   Chlorhexidine Gluconate Cloth  6 each Topical Daily   cinacalcet  30 mg Oral Q M,W,F-HD   diphenhydrAMINE  50 mg Oral Once   Or   diphenhydrAMINE  50 mg Intravenous Once   gabapentin  100 mg Oral QHS   levETIRAcetam  500 mg Oral Daily   levothyroxine  75 mcg Oral QAC breakfast   lidocaine (PF)  10 mL Infiltration Once   lisinopril  20 mg Oral Daily   magic mouthwash  5 mL Oral TID   metoprolol tartrate  12.5 mg Oral BID   octreotide  100 mcg Subcutaneous Q12H   polyethylene glycol  17 g Oral TID   rosuvastatin  10 mg Oral Daily   sevelamer carbonate  800 mg Oral TID WC   sodium zirconium cyclosilicate  10 g Oral Once   Continuous Infusions:  anticoagulant sodium citrate     PRN Meds:.ALPRAZolam, alteplase, anticoagulant sodium citrate, dicyclomine, guaiFENesin-dextromethorphan, heparin, hydrALAZINE, HYDROmorphone (DILAUDID) injection, lidocaine (PF), lidocaine-prilocaine, ondansetron **OR** ondansetron (ZOFRAN) IV, oxyCODONE, pentafluoroprop-tetrafluoroeth   I have personally reviewed following labs and imaging studies  LABORATORY DATA:  Awake Alert, No new F.N deficits, Normal affect, left IJ  HD catheter, left arm AV fistula, right groin femoral vein catheter [employer].AT,PERRAL Supple Neck, No JVD,   Symmetrical Chest wall movement, Good air movement bilaterally, CTAB RRR,No Gallops, Rubs or new Murmurs,  +ve B.Sounds, Abd Soft, No tenderness,   No Cyanosis, Clubbing or edema     RADIOLOGY STUDIES/RESULTS: CT ANGIO GI BLEED  Result Date: 11/13/2022 CLINICAL DATA:  83 year old female with history of possible mesenteric ischemia. EXAM: CTA ABDOMEN AND PELVIS WITHOUT AND WITH CONTRAST TECHNIQUE: Multidetector CT imaging of the abdomen and pelvis was performed using the standard protocol during bolus administration of intravenous contrast. Multiplanar reconstructed images and MIPs were obtained and reviewed to evaluate the vascular anatomy. RADIATION DOSE REDUCTION: This exam was performed according to the departmental dose-optimization program which includes automated exposure control, adjustment of the mA and/or kV according to patient size and/or use of iterative reconstruction technique. CONTRAST:  80mL OMNIPAQUE IOHEXOL 350 MG/ML SOLN COMPARISON:  CT of the abdomen and pelvis 11/01/2012. FINDINGS: VASCULAR Aorta: Extensive aortic atherosclerosis. Normal caliber aorta without aneurysm, dissection, vasculitis or significant stenosis. Celiac: Atherosclerosis resulting in moderate stenosis at the ostium. Otherwise patent without evidence of aneurysm, dissection, vasculitis or other significant stenosis. SMA: Atherosclerosis resulting in moderate stenosis at the ostium. Otherwise patent without evidence of aneurysm, dissection, vasculitis or significant stenosis. Renals: Both renal arteries are patent  but small caliber, with extensive atherosclerosis at the ostium of both vessels. IMA: Patent without evidence of aneurysm, dissection, vasculitis or significant stenosis. Inflow: Patent without evidence of aneurysm, dissection, vasculitis or significant stenosis. Proximal Outflow: Bilateral common femoral  and visualized portions of the superficial and profunda femoral arteries are patent without evidence of aneurysm, dissection, vasculitis or significant stenosis. Veins: No obvious venous abnormality within the limitations of this arterial phase study. Right femoral central venous catheter with tip extending into the intrahepatic portion of the inferior vena cava. Review of the MIP images confirms the above findings. NON-VASCULAR Lower chest: Small bilateral pleural effusions lying dependently with areas of passive subsegmental atelectasis in the lower lobes of the lungs bilaterally. Cardiomegaly. Atherosclerotic calcifications are noted in the descending thoracic aorta as well as the left anterior descending, left circumflex and right coronary arteries. Hepatobiliary: Mild periportal edema in the liver. No suspicious cystic or solid hepatic lesions. No intra or extrahepatic biliary ductal dilatation. Amorphous intermediate attenuation material lying dependently in the gallbladder, compatible with biliary sludge and/or noncalcified gallstones. Gallbladder is not distended. No pericholecystic fluid or surrounding inflammatory changes. Pancreas: No pancreatic mass. No pancreatic ductal dilatation. No pancreatic or peripancreatic fluid collections or inflammatory changes. Spleen: Unremarkable. Adrenals/Urinary Tract: Severe atrophy of the kidneys bilaterally. Innumerable small subcentimeter low-attenuation lesions in both kidneys, too small to definitively characterize, but statistically likely to represent tiny cysts. In addition, in the upper pole of the right kidney (axial image 15 of series 12 and coronal image 88 of series 15) there is a partially exophytic partially calcified 2.5 x 1.7 x 2.1 cm lesion which demonstrates heterogeneous enhancement, concerning for possible neoplasm such as a renal cell carcinoma. No hydroureteronephrosis. Urinary bladder is nearly completely decompressed, but otherwise unremarkable  in appearance. Bilateral adrenal glands are normal in appearance. Stomach/Bowel: The appearance of the stomach is unremarkable. There is no pathologic dilatation of small bowel or colon. Suture lines are evident in the region of the rectosigmoid junction and the cecum, presumably from prior partial colectomies. No high attenuation is noted in the lumen of the colon to suggest active extravasation on today's examination. Numerous colonic diverticula are noted, without surrounding inflammatory changes to indicate an acute diverticulitis at this time. Lymphatic: No lymphadenopathy noted in the abdomen or pelvis. Reproductive: Status post hysterectomy. Ovaries are not confidently identified may be surgically absent or atrophic. Other: No significant volume of ascites.  No pneumoperitoneum. Musculoskeletal: There are no aggressive appearing lytic or blastic lesions noted in the visualized portions of the skeleton. IMPRESSION: VASCULAR 1. No acute findings. 2. Extensive aortic atherosclerosis and atherosclerosis in the visceral arteries with moderate stenosis at the ostium of the celiac axis and superior mesenteric arteries, and severe chronic stenosis of the renal arteries bilaterally. 3. Coronary artery disease. NON-VASCULAR 1. No evidence of intraluminal extravasation of contrast within the colon. 2. Extensive colonic diverticulosis, without evidence of acute diverticulitis at this time. 3. Partially calcified heterogeneously enhancing lesion extending off the medial aspect of the upper pole of the right kidney, concerning for potential renal cell carcinoma. Further evaluation with nonemergent outpatient abdominal MRI with and without IV gadolinium is recommended in the near future to provide definitive characterization. 4. Biliary sludge and/or noncalcified gallstones lying dependently in the gallbladder. No imaging findings to suggest an acute cholecystitis at this time. 5. Severe bilateral renal atrophy. 6. Small  bilateral pleural effusions. 7. Cardiomegaly. 8. Additional incidental findings, as above. Electronically Signed   By: Brayton Mars.D.  On: 11/13/2022 07:44   IR Fluoro Guide CV Line Right  Result Date: 11/12/2022 INDICATION: Poor venous access. In need of durable intravenous access for contrast administration for impending CTA to evaluate for chronic mesenteric ischemia Patient with history of end-stage renal disease with multiple previous central venous accesses, currently receiving dialysis via left jugular approach dialysis catheter. Patient with history of contrast allergy, not presently premedicated. As such, the decision was made to proceed with placement of a non tunneled femoral approach central venous catheter given concern for difficulty/inability to place a right jugular approach central venous catheter given multiple previous hemodialysis catheters. EXAM: ULTRASOUND AND FLUOROSCOPIC GUIDED NON TUNNELED CENTRAL VENOUS CATHETER INSERTION MEDICATIONS: None. CONTRAST:  None FLUOROSCOPY TIME:  18 seconds (1 mGy) COMPLICATIONS: None immediate. TECHNIQUE: The procedure, risks, benefits, and alternatives were explained to the patient and informed written consent was obtained. A timeout was performed prior to the initiation of the procedure. The right groin was prepped with chlorhexidine in a sterile fashion, and a sterile drape was applied covering the operative field. Maximum barrier sterile technique with sterile gowns and gloves were used for the procedure. A timeout was performed prior to the initiation of the procedure. Local anesthesia was provided with 1% lidocaine. Right femoral head was marked fluoroscopically. Under direct ultrasound guidance, the right common femoral vein vein was accessed with a micropuncture kit after the overlying soft tissues were anesthetized with 1% lidocaine. Real-time ultrasound guidance was utilized for vascular access including the acquisition of a permanent  ultrasound image documenting patency of the accessed vessel. A guidewire was advanced to the level of the inferior caval-atrial junction for measurement purposes and the non tunneled central venous catheter was cut to length. A peel-away sheath was placed and a 5 [place], dual lumen was inserted to level of the inferior caval-atrial junction. A post procedure spot fluoroscopic was obtained. The catheter easily aspirated and flushed and was secured in place with an interrupted suture. A dressing was applied. The patient tolerated the procedure well without immediate post procedural complication. FINDINGS: After catheter placement, the tip lies within the inferior cavoatrial junction. The catheter aspirates and flushes normally and is ready for immediate use. IMPRESSION: Successful ultrasound and fluoroscopic guided placement of a right common femoral vein approach, 5 [place], dual lumen non tunneled central venous catheter with tip at the inferior caval-atrial junction. The central venous catheter is ready for immediate use. Electronically Signed   By: Simonne Come M.D.   On: 11/12/2022 14:03   IR US Guide Vasc Access Right  Result Date: 11/12/2022 INDICATION: Poor venous access. In need of durable intravenous access for contrast administration for impending CTA to evaluate for chronic mesenteric ischemia Patient with history of end-stage renal disease with multiple previous central venous accesses, currently receiving dialysis via left jugular approach dialysis catheter. Patient with history of contrast allergy, not presently premedicated. As such, the decision was made to proceed with placement of a non tunneled femoral approach central venous catheter given concern for difficulty/inability to place a right jugular approach central venous catheter given multiple previous hemodialysis catheters. EXAM: ULTRASOUND AND FLUOROSCOPIC GUIDED NON TUNNELED CENTRAL VENOUS CATHETER INSERTION MEDICATIONS: None. CONTRAST:  None  FLUOROSCOPY TIME:  18 seconds (1 mGy) COMPLICATIONS: None immediate. TECHNIQUE: The procedure, risks, benefits, and alternatives were explained to the patient and informed written consent was obtained. A timeout was performed prior to the initiation of the procedure. The right groin was prepped with chlorhexidine in a sterile fashion, and a sterile  drape was applied covering the operative field. Maximum barrier sterile technique with sterile gowns and gloves were used for the procedure. A timeout was performed prior to the initiation of the procedure. Local anesthesia was provided with 1% lidocaine. Right femoral head was marked fluoroscopically. Under direct ultrasound guidance, the right common femoral vein vein was accessed with a micropuncture kit after the overlying soft tissues were anesthetized with 1% lidocaine. Real-time ultrasound guidance was utilized for vascular access including the acquisition of a permanent ultrasound image documenting patency of the accessed vessel. A guidewire was advanced to the level of the inferior caval-atrial junction for measurement purposes and the non tunneled central venous catheter was cut to length. A peel-away sheath was placed and a 5 [place], dual lumen was inserted to level of the inferior caval-atrial junction. A post procedure spot fluoroscopic was obtained. The catheter easily aspirated and flushed and was secured in place with an interrupted suture. A dressing was applied. The patient tolerated the procedure well without immediate post procedural complication. FINDINGS: After catheter placement, the tip lies within the inferior cavoatrial junction. The catheter aspirates and flushes normally and is ready for immediate use. IMPRESSION: Successful ultrasound and fluoroscopic guided placement of a right common femoral vein approach, 5 [place], dual lumen non tunneled central venous catheter with tip at the inferior caval-atrial junction. The central venous catheter is  ready for immediate use. Electronically Signed   By: Simonne Come M.D.   On: 11/12/2022 14:03     LOS: 15 days   Signature  -    Susa Raring M.D on 11/13/2022 at 10:30 AM   -  To page go to [email or web address]

## 2022-11-13 NOTE — Progress Notes (Signed)
[facility] NEPHROLOGY PROGRESS NOTE  Subjective: Seen and examined at bedside.  No new /co's.    Objective Vital signs in last 24 hours: Vitals:   11/12/22 0834 11/12/22 1238 11/12/22 1600 11/12/22 1956  BP:   (!) 152/57 (!) 170/67  Pulse:    62  Resp:    19  Temp:   98 F (36.7 C)   TempSrc: Oral Oral Oral Oral  SpO2:   96%   Weight:      Height:        Physical Exam: General:NAD, comfortable Heart:RRR, s1s2 nl Lungs:clear b/l, no crackle Abdomen:soft, Non-tender, non-distended Extremities:No edema Dialysis Access: Left IJ TDC in place.  OP HD: MWF [facility]  Needs updating (jan 2024) --> 3.5h  59.5kg  bfr 300  2/2.5 bath LIJ TDC    Home meds - xanax, amiodarone, synthroid, metoprolol 12.5 bid, midodrine 5mg  tid and pre hd tiw, renavite, prilosec, crestor, renvela 4 ac tid, ultram, lipitor, gabapentin, lisinopril 20 mg qd, prns/ vits/ supps    Summary: Pt is a 82 y.o. yo female with history of HTN, CAD status post CABG, HLD, A-fib, GI bleed, ESRD on HD presented with generalized weakness/abdomen pain, following to manage ESRD.    Assessment/ Plan: Recurrent GI bleed w/ ABL anemia - likely GI source, required transfusion.  EGD/enteroscopy during this admission showing nonbleeding gastric ulcer, duodenal polyp sp biopsy. Getting octreotide.  Abd pain - possible mesenteric ischemia. Tx'd to [facility] for IR services to get IV access for a CT angio abdomen.  Hyperkalemia - K+ 5.2 this am. Got lokelma x 1. Recheck w/ HD tomorrow and adjust K+ bath if needed.  ESRD - MWF at [facility]. Next HD tomorrow.  MBD ckd - is on calcitriol, Cinacalcet and just resumed Renvela. Follow.  HTN/volume- UF with HD and continue current antihypertensives. Wt's have been stable between 56- 60kg while at [facility] and here at [facility].  Afib - on amio, BB CAD h/o CABG Hypothyroidism Cognitive impairment - chronic issue  Vinson Moselle, MD 11/13/2022, 6:43 PM    Recent Labs  Lab  11/09/22 1947 11/11/22 0407 11/13/22 0219  HGB  --  8.8* 9.2*  ALBUMIN 3.1*  --  2.9*  CALCIUM 8.4* 8.0* 8.6*  PHOS 6.9* 4.9* 5.3*  CREATININE 7.55* 5.56* 5.78*  K 4.1 4.0 5.2*     Inpatient medications:  amiodarone  100 mg Oral Daily   calcitRIOL  1.75 mcg Oral Q M,W,F-HD   Chlorhexidine Gluconate Cloth  6 each Topical Daily   cinacalcet  30 mg Oral Q M,W,F-HD   diphenhydrAMINE  50 mg Oral Once   Or   diphenhydrAMINE  50 mg Intravenous Once   gabapentin  100 mg Oral QHS   hydrALAZINE  100 mg Oral Q8H   levETIRAcetam  500 mg Oral Daily   levothyroxine  75 mcg Oral QAC breakfast   lidocaine (PF)  10 mL Infiltration Once   lisinopril  20 mg Oral Daily   magic mouthwash  5 mL Oral TID   metoprolol tartrate  12.5 mg Oral BID   octreotide  100 mcg Subcutaneous Q12H   polyethylene glycol  17 g Oral TID   rosuvastatin  10 mg Oral Daily   sevelamer carbonate  800 mg Oral TID WC    anticoagulant sodium citrate     ALPRAZolam, alteplase, anticoagulant sodium citrate, dicyclomine, guaiFENesin-dextromethorphan, heparin, HYDROmorphone (DILAUDID) injection, lidocaine (PF), lidocaine-prilocaine, ondansetron **OR** ondansetron (ZOFRAN) IV, oxyCODONE, pentafluoroprop-tetrafluoroeth

## 2022-11-13 NOTE — Plan of Care (Signed)
CTA reviewed by Dr. Grace Isaac today who notes questionable narrowing at the celiac artery/SMA, it is not clear that this is the cause of patient's intermittent abdominal pain. Per chart and discussion with hospitalist abdominal pain has largely resolved.   No plans for inpatient IR intervention -- if family wishes to proceed with additional work up for mesenteric ischemia we are happy to see the patient in IR clinic after discharge. I have placed our contact information in the patient's AVS, she may call at any time to schedule a consultation.   No further IR needs at this time. Plans per primary team.  Please call with questions or concerns.  Lynnette Caffey, PA-C

## 2022-11-13 NOTE — Evaluation (Signed)
Occupational Therapy Evaluation Patient Details Name: Shawna [name] MRN: [id number] DOB: [birth year] Today's Date: 11/13/2022   History of Present Illness Pt is 82 yo female admitted to [facility]  on 10/28/22 with weakness and Hgb of 7.1.   Patient was transfused PRBC-evaluated by GI-underwent small bowel enteroscopy which showed nonbleeding gastric ulcer, further hospital course complicated by seizures, and worsening abdominal pain with concern for chronic mesenteric ischemia.  While at [facility]-patient loss IV access. Ultimately, patient was  transferred to [facility] on 5/3 for placement of IV access and is s/p R femoral non-tunneled CVC on 11/12/22. She has hx including but not limited to ESRD on HD MWF, CAD , CABG, HTN, HLD, PAF, recurrent GI bleeds.   Clinical Impression   Pt reports using cane at baseline for mobility, spouse assists with ADLs as needed. Pt limited by elevated BP this session, needing set up -mod A for ADLs, min guard for bed mobility and min guard for transfers with RW. Pt with R lateral lean in sitting when fatigued. Pt presenting with impairments listed below, will follow acutely. Recommend HHOT at d/c.      Recommendations for follow up therapy are one component of a multi-disciplinary discharge planning process, led by the attending physician.  Recommendations may be updated based on patient status, additional functional criteria and insurance authorization.   Assistance Recommended at Discharge Intermittent Supervision/Assistance  Patient can return home with the following A little help with walking and/or transfers;Assistance with cooking/housework;A lot of help with bathing/dressing/bathroom;Direct supervision/assist for financial management;Direct supervision/assist for medications management;Assist for transportation;Help with stairs or ramp for entrance    Functional Status Assessment  Patient has had a recent decline in their functional status and demonstrates the ability to  make significant improvements in function in a reasonable and predictable amount of time.  Equipment Recommendations  None recommended by OT (pt has all needed DME)    Recommendations for Other Services PT consult     Precautions / Restrictions Precautions Precautions: Fall Precaution Comments: R femoral catheter 5/4 Restrictions Weight Bearing Restrictions: No      Mobility Bed Mobility Overal bed mobility: Needs Assistance Bed Mobility: Supine to Sit, Sit to Supine     Supine to sit: Min guard Sit to supine: Min guard   General bed mobility comments: Increased time but no physical assist    Transfers Overall transfer level: Needs assistance Equipment used: Rolling walker (2 wheels) Transfers: Sit to/from Stand Sit to Stand: Min guard           General transfer comment: Increased time and min guard for safety      Balance Overall balance assessment: Needs assistance Sitting-balance support: Bilateral upper extremity supported Sitting balance-Leahy Scale: Poor Sitting balance - Comments: R lateral lean at times seated EOB   Standing balance support: Bilateral upper extremity supported Standing balance-Leahy Scale: Poor Standing balance comment: RW and min guard/min A                           ADL either performed or assessed with clinical judgement   ADL Overall ADL's : Needs assistance/impaired Eating/Feeding: Set up;Sitting;Bed level   Grooming: Set up;Sitting   Upper Body Bathing: Sitting;Minimal assistance   Lower Body Bathing: Moderate assistance   Upper Body Dressing : Minimal assistance;Sitting   Lower Body Dressing: Moderate assistance;Sitting/lateral leans   Toilet Transfer: Min guard;Ambulation;Regular Toilet;Rolling walker (2 wheels)   Toileting- Clothing Manipulation and Hygiene: Min guard  Functional mobility during ADLs: Min guard;Rolling walker (2 wheels)       Vision Baseline Vision/History: 1 Wears  glasses Vision Assessment?: No apparent visual deficits     Perception Perception Perception Tested?: No   Praxis Praxis Praxis tested?: Not tested    Pertinent Vitals/Pain Pain Assessment Pain Assessment: No/denies pain     Hand Dominance Right   Extremity/Trunk Assessment Upper Extremity Assessment Upper Extremity Assessment: Generalized weakness   Lower Extremity Assessment Lower Extremity Assessment: Defer to PT evaluation RLE Deficits / Details: ROM WFL; MMT: ankle 5/5, knee and hip at least 3/5 but not further assessed due to femoral line LLE Deficits / Details: ROM WFL; MMT 5/5   Cervical / Trunk Assessment Cervical / Trunk Assessment: Normal   Communication Communication Communication: No difficulties   Cognition Arousal/Alertness: Awake/alert Behavior During Therapy: WFL for tasks assessed/performed Overall Cognitive Status: History of cognitive impairments - at baseline                                 General Comments: Overall followed commands and oriented, but did have episodes of confused time line and short term memory loss. Family reports close to baseline     General Comments  elevated BP (see PT eval for vitals)    Exercises     Shoulder Instructions      Home Living Family/patient expects to be discharged to:: Private residence Living Arrangements: Spouse/significant other Available Help at Discharge: Family;Available 24 hours/day Type of Home: House Home Access: Stairs to enter [employer] of Steps: 2 Entrance Stairs-Rails: Right Home Layout: One level     Bathroom Shower/Tub: Chief Strategy Officer: Standard Bathroom Accessibility: Yes   Home Equipment: Rollator (4 wheels);Cane - single point;Shower seat;BSC/3in1          Prior Functioning/Environment Prior Level of Function : Needs assist             Mobility Comments: Pt typically uses her hurri-cane to ambulate in house but will  occasionally use rollator.  Spouse assist with stairs. ADLs Comments: Spouse provides min A for ADLs and total assist for IADLs        OT Problem List: Decreased strength;Decreased range of motion;Decreased activity tolerance;Impaired balance (sitting and/or standing);Decreased cognition;Decreased safety awareness;Cardiopulmonary status limiting activity      OT Treatment/Interventions: Self-care/ADL training;Therapeutic exercise;Energy conservation;DME and/or AE instruction;Therapeutic activities;Patient/family education;Balance training    OT Goals(Current goals can be found in the care plan section) Acute Rehab OT Goals Patient Stated Goal: none stated OT Goal Formulation: With patient Time For Goal Achievement: 11/27/22 Potential to Achieve Goals: Good ADL Goals Pt Will Perform Upper Body Dressing: with supervision;sitting Pt Will Perform Lower Body Dressing: with supervision;sitting/lateral leans;sit to/from stand Pt Will Transfer to Toilet: with supervision;ambulating;regular height toilet Pt Will Perform Tub/Shower Transfer: with supervision;ambulating;shower seat;rolling walker;Shower transfer;Tub transfer  OT Frequency: Min 1X/week    Co-evaluation   Reason for Co-Treatment: Complexity of the patient's impairments (multi-system involvement) (no tech, 16 day hospitalization)          AM-PAC OT "6 Clicks" Daily Activity     Outcome Measure Help from another person eating meals?: None Help from another person taking care of personal grooming?: A Little Help from another person toileting, which includes using toliet, bedpan, or urinal?: A Little Help from another person bathing (including washing, rinsing, drying)?: A Lot Help from another person to put on and taking  off regular upper body clothing?: A Little Help from another person to put on and taking off regular lower body clothing?: A Lot 6 Click Score: 17   End of Session Equipment Utilized During Treatment: Gait  belt;Rolling walker (2 wheels) Nurse Communication: Mobility status  Activity Tolerance: Patient tolerated treatment well Patient left: in bed;with call bell/phone within reach;with bed alarm set;with family/visitor present  OT Visit Diagnosis: Unsteadiness on feet (R26.81);Other abnormalities of gait and mobility (R26.89);Muscle weakness (generalized) (M62.81)                Time: [id number] OT Time Calculation (min): 29 min Charges:  OT General Charges $OT Visit: 1 Visit OT Evaluation $OT Eval Moderate Complexity: 1 Mod  Cleto Claggett K, OTD, OTR/L SecureChat Preferred Acute Rehab [phone]   Carver Fila Koonce 11/13/2022, 4:37 PM

## 2022-11-13 NOTE — Plan of Care (Signed)

## 2022-11-14 ENCOUNTER — Other Ambulatory Visit (HOSPITAL_COMMUNITY): Payer: Self-pay

## 2022-11-14 ENCOUNTER — Telehealth (HOSPITAL_COMMUNITY): Payer: Self-pay | Admitting: Pharmacy Technician

## 2022-11-14 DIAGNOSIS — D649 Anemia, unspecified: Secondary | ICD-10-CM | POA: Diagnosis not present

## 2022-11-14 LAB — RENAL FUNCTION PANEL
Albumin: 3 g/dL — ABNORMAL LOW (ref 3.5–5.0)
Anion gap: 16 — ABNORMAL HIGH (ref 5–15)
BUN: 76 mg/dL — ABNORMAL HIGH (ref 8–23)
CO2: 22 mmol/L (ref 22–32)
Calcium: 8.5 mg/dL — ABNORMAL LOW (ref 8.9–10.3)
Chloride: 89 mmol/L — ABNORMAL LOW (ref 98–111)
Creatinine, Ser: 7.76 mg/dL — ABNORMAL HIGH (ref 0.44–1.00)
GFR, Estimated: 5 mL/min — ABNORMAL LOW (ref >60)
Glucose, Bld: 134 mg/dL — ABNORMAL HIGH (ref 70–99)
Phosphorus: 7 mg/dL — ABNORMAL HIGH (ref 2.5–4.6)
Potassium: 4.8 mmol/L (ref 3.5–5.1)
Sodium: 127 mmol/L — ABNORMAL LOW (ref 135–145)

## 2022-11-14 LAB — CBC
HCT: 26 % — ABNORMAL LOW (ref 36.0–46.0)
Hemoglobin: 8.9 g/dL — ABNORMAL LOW (ref 12.0–15.0)
MCH: 36.2 pg — ABNORMAL HIGH (ref 26.0–34.0)
MCHC: 34.2 g/dL (ref 30.0–36.0)
MCV: 105.7 fL — ABNORMAL HIGH (ref 80.0–100.0)
Platelets: 297 10*3/uL (ref 150–400)
RBC: 2.46 MIL/uL — ABNORMAL LOW (ref 3.87–5.11)
RDW: 25 % — ABNORMAL HIGH (ref 11.5–15.5)
WBC: 18.7 10*3/uL — ABNORMAL HIGH (ref 4.0–10.5)
nRBC: 0 % (ref 0.0–0.2)

## 2022-11-14 LAB — GLUCOSE, CAPILLARY
Glucose-Capillary: 141 mg/dL — ABNORMAL HIGH (ref 70–99)
Glucose-Capillary: 123 mg/dL — ABNORMAL HIGH (ref 70–99)

## 2022-11-14 LAB — MAGNESIUM: Magnesium: 2.2 mg/dL (ref 1.7–2.4)

## 2022-11-14 MED ORDER — HYDRALAZINE HCL 20 MG/ML IJ SOLN: 10.0000 mg | INTRAMUSCULAR | Status: DC | PRN

## 2022-11-14 MED ORDER — SODIUM ZIRCONIUM CYCLOSILICATE 5 G PO PACK
5.0000 g | PACK | ORAL | Status: DC
  Filled 2022-11-14: qty 1

## 2022-11-14 MED ORDER — HEPARIN SODIUM (PORCINE) 1000 UNIT/ML DIALYSIS
2000.0000 [IU] | Freq: Once | INTRAMUSCULAR | Status: AC
  Administered 2022-11-14: 2000 [IU] via INTRAVENOUS_CENTRAL
  Filled 2022-11-14: qty 2

## 2022-11-14 MED ORDER — LISINOPRIL 20 MG PO TABS
40.0000 mg | ORAL_TABLET | Freq: Every day | ORAL | Status: DC
  Administered 2022-11-14 – 2022-11-15 (×2): 40 mg via ORAL
  Filled 2022-11-14 (×3): qty 2

## 2022-11-14 MED ORDER — HEPARIN SODIUM (PORCINE) 1000 UNIT/ML IJ SOLN
INTRAMUSCULAR | Status: AC
  Administered 2022-11-14: 1000 [IU]
  Filled 2022-11-14: qty 4

## 2022-11-14 NOTE — Progress Notes (Signed)
PROGRESS NOTE        PATIENT DETAILS Name: Shawna [name] Age: 82 y.o. Sex: female Date of Birth: 1940/09/25 Admit Date: 10/28/2022 Admitting Physician Joseph Art, DO WUJ:[id number], [facility]  Brief Summary: Patient is a 82 y.o.  female with history of ESRD on HD MWF, CAD s/p CABG, HTN, HLD, PAF, recurrent GI bleeding due to AVMs-who was initially admitted to [facility] on 4/19 with weakness-Hb showed hemoglobin of 7.1.  Patient was transfused PRBC-evaluated by GI-underwent small bowel enteroscopy which showed nonbleeding gastric ulcer, further hospital course complicated by seizures, and worsening abdominal pain with concern for chronic mesenteric ischemia.  While at [facility]-patient loss IV access-and any IV access was unable to be obtained, hence patient was unable to get a CT angiogram of the abdomen.  Ultimately, patient was  transferred to [facility] on 5/3 for placement of IV access ( CT angiogram abdomen) and IR evaluation.  See below for further details.  Significant events: 4/19>> admit to [facility] for anemia/weakness. 4/21>> AMS-seizure-like activity-Keppra started-neurology consulted 4/23>> small bowel enteroscopy 5/03>> transferred to [facility]  Significant studies: 4/21>> CT head: No ICH/infarct.  Subdural hygromas overlying both cerebral hemispheres. 4/22>> EEG: No seizures 4/22>> MRI brain: No CVA-redemonstrated left> right subdural hygromas 4/24>> CT abdomen/pelvis no contrast: Ileus versus mild partial obstruction, distended gallbladder. 4/25>> RUQ ultrasound: No biliary ductal dilatation-gallbladder wall is thickened-no gallbladder stones.  Negative Murphy sign. 5/03>> [place] mesenteric arteries: Elevated peak systolic velocity in the superior mesenteric artery suspicious for significant stenosis.  Significant microbiology data: 4/24>> blood culture: No growth  Procedures: 4/23>> small bowel enteroscopy: Nonbleeding gastric ulcer with clean base, nonbleeding  duodenal diverticula, jejunal polyp.  Consults: GI General surgery Neurology IR Nephrology  Subjective:   Patient in bed, appears comfortable, denies any headache, no fever, no chest pain or pressure, no shortness of breath , no abdominal pain. No new focal weakness.   Objective: Vitals: Blood pressure (!) 183/71, pulse 60, temperature 98.2 F (36.8 C), temperature source Oral, resp. rate 18, height 5\' 2"  (1.575 m), weight 57.1 kg, SpO2 96 %.   Exam:  Awake Alert, No new F.N deficits, Normal affect [place].AT,PERRAL Supple Neck, No JVD,   Symmetrical Chest wall movement, Good air movement bilaterally, CTAB RRR,No Gallops, Rubs or new Murmurs,  +ve B.Sounds, Abd Soft, No tenderness,   No Cyanosis, Clubbing or edema    Assessment/Plan:  Recurrent GI bleeding with acute on chronic blood loss anemia Hb now stable-required 1 unit of PRBC on 4/20 Small bowel enteroscopy as above Continue octreotide-per GI-this will need to be continued on discharge  Seizure disorder No further seizures Evaluated by neuro at [facility]-continue Keppra Neuroimaging as above  Worsening generalized abdominal pain-possible chronic mesenteric ischemia CTA done, case discussed with Dr. Grace Isaac IR, patient is completely symptom-free currently, abdominal exam is benign, for now outpatient follow-up with IR and 1 to 2 weeks, will plan for discharge, right femoral catheter to be removed, close to discharge.  Will consult IR for the same.  ESRD on HD MWF Nephrology at [facility] made aware of patient's transfer HD per nephrology Since she is now on high dose of ACE inhibitor with intermittent hyperkalemia will place her on low-dose Kayexalate every 48 hours and monitor electrolytes closely  PAF/history of SVT Sinus rhythm Continue amiodarone/metoprolol Not a candidate for anticoagulation due to recurrent GI bleeding  HTN BP relatively high currently on combination of beta-blocker, hydralazine, ACE inhibitor, dose  adjusted on 11/14/2022 for better control likely running high due to octreotide.   HLD Statin  CAD s/p CABG/PCI No anginal symptoms ASA on hold due to GI bleeding/anemia Continue statin/beta-blocker  Hypothyroidism Continue Synthroid  Cognitive impairment Per prior hospitalist-this is a chronic issue Maintain delirium precautions  BMI: Estimated body mass index is 23.02 kg/m as calculated from the following:   Height as of this encounter: 5\' 2"  (1.575 m).   Weight as of this encounter: 57.1 kg.   Code status:   Code Status: Full Code   DVT Prophylaxis: SCDs Start: 10/29/22 0707   Family Communication:  Daughter-Mareka [phone]- on 5/4    Disposition Plan: Status is: Inpatient Remains inpatient appropriate because: Severity of illness   Planned Discharge Destination:Home health   Diet: Diet Order             Diet renal with fluid restriction Fluid restriction: 1200 mL Fluid; Room service appropriate? Yes; Fluid consistency: Thin  Diet effective now                   MEDICATIONS: Scheduled Meds:  amiodarone  100 mg Oral Daily   calcitRIOL  1.75 mcg Oral Q M,W,F-HD   Chlorhexidine Gluconate Cloth  6 each Topical Daily   cinacalcet  30 mg Oral Q M,W,F-HD   diphenhydrAMINE  50 mg Oral Once   Or   diphenhydrAMINE  50 mg Intravenous Once   gabapentin  100 mg Oral QHS   [START ON 11/15/2022] heparin  2,000 Units Dialysis Once in dialysis   hydrALAZINE  100 mg Oral Q8H   levETIRAcetam  500 mg Oral Daily   levothyroxine  75 mcg Oral QAC breakfast   lidocaine (PF)  10 mL Infiltration Once   lisinopril  20 mg Oral Daily   magic mouthwash  5 mL Oral TID   metoprolol tartrate  12.5 mg Oral BID   octreotide  100 mcg Subcutaneous Q12H   polyethylene glycol  17 g Oral TID   rosuvastatin  10 mg Oral Daily   sevelamer carbonate  800 mg Oral TID WC   Continuous Infusions:  anticoagulant sodium citrate     PRN Meds:.ALPRAZolam, alteplase, anticoagulant  sodium citrate, dicyclomine, guaiFENesin-dextromethorphan, heparin, hydrALAZINE, HYDROmorphone (DILAUDID) injection, lidocaine (PF), lidocaine-prilocaine, ondansetron **OR** ondansetron (ZOFRAN) IV, oxyCODONE, pentafluoroprop-tetrafluoroeth   I have personally reviewed following labs and imaging studies  LABORATORY DATA:  Awake Alert, No new F.N deficits, Normal affect, left IJ HD catheter, left arm AV fistula, right groin femoral vein catheter [place].AT,PERRAL Supple Neck, No JVD,   Symmetrical Chest wall movement, Good air movement bilaterally, CTAB RRR,No Gallops, Rubs or new Murmurs,  +ve B.Sounds, Abd Soft, No tenderness,   No Cyanosis, Clubbing or edema     RADIOLOGY STUDIES/RESULTS: CT ANGIO GI BLEED  Result Date: 11/13/2022 CLINICAL DATA:  82 year old female with history of possible mesenteric ischemia. EXAM: CTA ABDOMEN AND PELVIS WITHOUT AND WITH CONTRAST TECHNIQUE: Multidetector CT imaging of the abdomen and pelvis was performed using the standard protocol during bolus administration of intravenous contrast. Multiplanar reconstructed images and MIPs were obtained and reviewed to evaluate the vascular anatomy. RADIATION DOSE REDUCTION: This exam was performed according to the departmental dose-optimization program which includes automated exposure control, adjustment of the mA and/or kV according to patient size and/or use of iterative reconstruction technique. CONTRAST:  80mL OMNIPAQUE IOHEXOL 350 MG/ML SOLN COMPARISON:  CT of the abdomen and  pelvis 11/01/2012. FINDINGS: VASCULAR Aorta: Extensive aortic atherosclerosis. Normal caliber aorta without aneurysm, dissection, vasculitis or significant stenosis. Celiac: Atherosclerosis resulting in moderate stenosis at the ostium. Otherwise patent without evidence of aneurysm, dissection, vasculitis or other significant stenosis. SMA: Atherosclerosis resulting in moderate stenosis at the ostium. Otherwise patent without evidence of aneurysm,  dissection, vasculitis or significant stenosis. Renals: Both renal arteries are patent but small caliber, with extensive atherosclerosis at the ostium of both vessels. IMA: Patent without evidence of aneurysm, dissection, vasculitis or significant stenosis. Inflow: Patent without evidence of aneurysm, dissection, vasculitis or significant stenosis. Proximal Outflow: Bilateral common femoral and visualized portions of the superficial and profunda femoral arteries are patent without evidence of aneurysm, dissection, vasculitis or significant stenosis. Veins: No obvious venous abnormality within the limitations of this arterial phase study. Right femoral central venous catheter with tip extending into the intrahepatic portion of the inferior vena cava. Review of the MIP images confirms the above findings. NON-VASCULAR Lower chest: Small bilateral pleural effusions lying dependently with areas of passive subsegmental atelectasis in the lower lobes of the lungs bilaterally. Cardiomegaly. Atherosclerotic calcifications are noted in the descending thoracic aorta as well as the left anterior descending, left circumflex and right coronary arteries. Hepatobiliary: Mild periportal edema in the liver. No suspicious cystic or solid hepatic lesions. No intra or extrahepatic biliary ductal dilatation. Amorphous intermediate attenuation material lying dependently in the gallbladder, compatible with biliary sludge and/or noncalcified gallstones. Gallbladder is not distended. No pericholecystic fluid or surrounding inflammatory changes. Pancreas: No pancreatic mass. No pancreatic ductal dilatation. No pancreatic or peripancreatic fluid collections or inflammatory changes. Spleen: Unremarkable. Adrenals/Urinary Tract: Severe atrophy of the kidneys bilaterally. Innumerable small subcentimeter low-attenuation lesions in both kidneys, too small to definitively characterize, but statistically likely to represent tiny cysts. In addition,  in the upper pole of the right kidney (axial image 15 of series 12 and coronal image 88 of series 15) there is a partially exophytic partially calcified 2.5 x 1.7 x 2.1 cm lesion which demonstrates heterogeneous enhancement, concerning for possible neoplasm such as a renal cell carcinoma. No hydroureteronephrosis. Urinary bladder is nearly completely decompressed, but otherwise unremarkable in appearance. Bilateral adrenal glands are normal in appearance. Stomach/Bowel: The appearance of the stomach is unremarkable. There is no pathologic dilatation of small bowel or colon. Suture lines are evident in the region of the rectosigmoid junction and the cecum, presumably from prior partial colectomies. No high attenuation is noted in the lumen of the colon to suggest active extravasation on today's examination. Numerous colonic diverticula are noted, without surrounding inflammatory changes to indicate an acute diverticulitis at this time. Lymphatic: No lymphadenopathy noted in the abdomen or pelvis. Reproductive: Status post hysterectomy. Ovaries are not confidently identified may be surgically absent or atrophic. Other: No significant volume of ascites.  No pneumoperitoneum. Musculoskeletal: There are no aggressive appearing lytic or blastic lesions noted in the visualized portions of the skeleton. IMPRESSION: VASCULAR 1. No acute findings. 2. Extensive aortic atherosclerosis and atherosclerosis in the visceral arteries with moderate stenosis at the ostium of the celiac axis and superior mesenteric arteries, and severe chronic stenosis of the renal arteries bilaterally. 3. Coronary artery disease. NON-VASCULAR 1. No evidence of intraluminal extravasation of contrast within the colon. 2. Extensive colonic diverticulosis, without evidence of acute diverticulitis at this time. 3. Partially calcified heterogeneously enhancing lesion extending off the medial aspect of the upper pole of the right kidney, concerning for  potential renal cell carcinoma. Further evaluation with nonemergent outpatient abdominal  MRI with and without IV gadolinium is recommended in the near future to provide definitive characterization. 4. Biliary sludge and/or noncalcified gallstones lying dependently in the gallbladder. No imaging findings to suggest an acute cholecystitis at this time. 5. Severe bilateral renal atrophy. 6. Small bilateral pleural effusions. 7. Cardiomegaly. 8. Additional incidental findings, as above. Electronically Signed   By: Trudie Reed M.D.   On: 11/13/2022 07:44   IR Fluoro Guide CV Line Right  Result Date: 11/12/2022 INDICATION: Poor venous access. In need of durable intravenous access for contrast administration for impending CTA to evaluate for chronic mesenteric ischemia Patient with history of end-stage renal disease with multiple previous central venous accesses, currently receiving dialysis via left jugular approach dialysis catheter. Patient with history of contrast allergy, not presently premedicated. As such, the decision was made to proceed with placement of a non tunneled femoral approach central venous catheter given concern for difficulty/inability to place a right jugular approach central venous catheter given multiple previous hemodialysis catheters. EXAM: ULTRASOUND AND FLUOROSCOPIC GUIDED NON TUNNELED CENTRAL VENOUS CATHETER INSERTION MEDICATIONS: None. CONTRAST:  None FLUOROSCOPY TIME:  18 seconds (1 mGy) COMPLICATIONS: None immediate. TECHNIQUE: The procedure, risks, benefits, and alternatives were explained to the patient and informed written consent was obtained. A timeout was performed prior to the initiation of the procedure. The right groin was prepped with chlorhexidine in a sterile fashion, and a sterile drape was applied covering the operative field. Maximum barrier sterile technique with sterile gowns and gloves were used for the procedure. A timeout was performed prior to the initiation of  the procedure. Local anesthesia was provided with 1% lidocaine. Right femoral head was marked fluoroscopically. Under direct ultrasound guidance, the right common femoral vein vein was accessed with a micropuncture kit after the overlying soft tissues were anesthetized with 1% lidocaine. Real-time ultrasound guidance was utilized for vascular access including the acquisition of a permanent ultrasound image documenting patency of the accessed vessel. A guidewire was advanced to the level of the inferior caval-atrial junction for measurement purposes and the non tunneled central venous catheter was cut to length. A peel-away sheath was placed and a 5 [place], dual lumen was inserted to level of the inferior caval-atrial junction. A post procedure spot fluoroscopic was obtained. The catheter easily aspirated and flushed and was secured in place with an interrupted suture. A dressing was applied. The patient tolerated the procedure well without immediate post procedural complication. FINDINGS: After catheter placement, the tip lies within the inferior cavoatrial junction. The catheter aspirates and flushes normally and is ready for immediate use. IMPRESSION: Successful ultrasound and fluoroscopic guided placement of a right common femoral vein approach, 5 [place], dual lumen non tunneled central venous catheter with tip at the inferior caval-atrial junction. The central venous catheter is ready for immediate use. Electronically Signed   By: Simonne Come M.D.   On: 11/12/2022 14:03   IR US Guide Vasc Access Right  Result Date: 11/12/2022 INDICATION: Poor venous access. In need of durable intravenous access for contrast administration for impending CTA to evaluate for chronic mesenteric ischemia Patient with history of end-stage renal disease with multiple previous central venous accesses, currently receiving dialysis via left jugular approach dialysis catheter. Patient with history of contrast allergy, not presently  premedicated. As such, the decision was made to proceed with placement of a non tunneled femoral approach central venous catheter given concern for difficulty/inability to place a right jugular approach central venous catheter given multiple previous hemodialysis catheters. EXAM: ULTRASOUND  AND FLUOROSCOPIC GUIDED NON TUNNELED CENTRAL VENOUS CATHETER INSERTION MEDICATIONS: None. CONTRAST:  None FLUOROSCOPY TIME:  18 seconds (1 mGy) COMPLICATIONS: None immediate. TECHNIQUE: The procedure, risks, benefits, and alternatives were explained to the patient and informed written consent was obtained. A timeout was performed prior to the initiation of the procedure. The right groin was prepped with chlorhexidine in a sterile fashion, and a sterile drape was applied covering the operative field. Maximum barrier sterile technique with sterile gowns and gloves were used for the procedure. A timeout was performed prior to the initiation of the procedure. Local anesthesia was provided with 1% lidocaine. Right femoral head was marked fluoroscopically. Under direct ultrasound guidance, the right common femoral vein vein was accessed with a micropuncture kit after the overlying soft tissues were anesthetized with 1% lidocaine. Real-time ultrasound guidance was utilized for vascular access including the acquisition of a permanent ultrasound image documenting patency of the accessed vessel. A guidewire was advanced to the level of the inferior caval-atrial junction for measurement purposes and the non tunneled central venous catheter was cut to length. A peel-away sheath was placed and a 5 [place], dual lumen was inserted to level of the inferior caval-atrial junction. A post procedure spot fluoroscopic was obtained. The catheter easily aspirated and flushed and was secured in place with an interrupted suture. A dressing was applied. The patient tolerated the procedure well without immediate post procedural complication. FINDINGS:  After catheter placement, the tip lies within the inferior cavoatrial junction. The catheter aspirates and flushes normally and is ready for immediate use. IMPRESSION: Successful ultrasound and fluoroscopic guided placement of a right common femoral vein approach, 5 [place], dual lumen non tunneled central venous catheter with tip at the inferior caval-atrial junction. The central venous catheter is ready for immediate use. Electronically Signed   By: Simonne Come M.D.   On: 11/12/2022 14:03     LOS: 16 days   Signature  -    Susa Raring M.D on 11/14/2022 at 8:25 AM   -  To page go to [email or web address]

## 2022-11-14 NOTE — Progress Notes (Signed)
[facility] night cross cover note:   I was notified by RN of patient's current BP of 187/71. No new reported symptoms. HR's in the 60's. I added prn iv hydralazine for sbp greater than 180 mmHg.     Newton Pigg, DO Hospitalist

## 2022-11-14 NOTE — Telephone Encounter (Signed)
I will reach out to Milon Dikes with [facility].

## 2022-11-14 NOTE — Procedures (Signed)
I was present at this dialysis session. I have reviewed the session itself and made appropriate changes.   Filed Weights   11/11/22 1350 11/11/22 1750 11/14/22 0833  Weight: 59.2 kg 57.1 kg 61.9 kg    Recent Labs  Lab 11/13/22 0219  NA 128*  K 5.2*  CL 94*  CO2 22  GLUCOSE 182*  [name] 45*  CREATININE 5.78*  CALCIUM 8.6*  PHOS 5.3*    Recent Labs  Lab 11/09/22 0351 11/11/22 0407 11/13/22 0219  WBC 9.1 7.8 13.0*  HGB 10.3* 8.8* 9.2*  HCT 30.6* 26.7* 27.5*  MCV 106.3* 108.1* 105.4*  PLT 220 229 271    Scheduled Meds:  amiodarone  100 mg Oral Daily   calcitRIOL  1.75 mcg Oral Q M,W,F-HD   Chlorhexidine Gluconate Cloth  6 each Topical Daily   cinacalcet  30 mg Oral Q M,W,F-HD   diphenhydrAMINE  50 mg Oral Once   Or   diphenhydrAMINE  50 mg Intravenous Once   gabapentin  100 mg Oral QHS   hydrALAZINE  100 mg Oral Q8H   levETIRAcetam  500 mg Oral Daily   levothyroxine  75 mcg Oral QAC breakfast   lidocaine (PF)  10 mL Infiltration Once   lisinopril  40 mg Oral Daily   magic mouthwash  5 mL Oral TID   metoprolol tartrate  12.5 mg Oral BID   octreotide  100 mcg Subcutaneous Q12H   polyethylene glycol  17 g Oral TID   rosuvastatin  10 mg Oral Daily   sevelamer carbonate  800 mg Oral TID WC   [START ON 11/15/2022] sodium zirconium cyclosilicate  5 g Oral Q48H   Continuous Infusions:  anticoagulant sodium citrate     PRN Meds:.ALPRAZolam, alteplase, anticoagulant sodium citrate, dicyclomine, guaiFENesin-dextromethorphan, heparin, hydrALAZINE, HYDROmorphone (DILAUDID) injection, lidocaine (PF), lidocaine-prilocaine, ondansetron **OR** ondansetron (ZOFRAN) IV, oxyCODONE, pentafluoroprop-tetrafluoroeth   Shawna Bun,  MD 11/14/2022, 11:45 AM

## 2022-11-14 NOTE — Plan of Care (Signed)

## 2022-11-14 NOTE — Progress Notes (Signed)
Received patient in bed.Alert and oriented to person,place at times situation.She consented her treatment .  Access used : Left Hd catheter that worked well.Dressing changed done today.  Medicine given : Heparin 2,000 units pre run dose.  Duration of treatment;3.25 hours.  Fluid removed : Achieved prescribed UF goal of 2.5 liters.  Hemo issue:Tolerated treatment well.  Hand off to the patient's nurse.

## 2022-11-14 NOTE — Progress Notes (Signed)
Nephrology Follow-Up Consult note   Assessment/Recommendations: Shawna [name] is a/an 82 y.o. female with a past medical history significant for HTN, CAD status post CABG, HLD, atrial fibrillation, GI bleeding, ESRD admitted for GI bleeding.       OP HD: MWF [facility]  Needs updating (jan 2024) --> 3.5h  59.5kg  bfr 300  2/2.5 bath [facility]        Summary: Pt is a 82 y.o. yo female with history of HTN, CAD status post CABG, HLD, A-fib, GI bleed, ESRD on HD presented with generalized weakness/abdomen pain, following to manage ESRD.      Assessment/ Plan: Recurrent GI bleed w/ ABL anemia - likely GI source, required transfusion.  EGD/enteroscopy during this admission showing nonbleeding gastric ulcer, duodenal polyp sp biopsy. Getting octreotide.  Abd pain - possible mesenteric ischemia.  Management per primary Hyperkalemia -management with dialysis ESRD - MWF at [facility].  Maintain schedule MBD ckd - is on calcitriol, Cinacalcet and just resumed Renvela. Follow.  HTN/volume- UF with HD and continue current antihypertensives. Wt's have been stable between 56- 60kg while at [facility] and here at [facility].  Afib - on amio, BB CAD h/o CABG Hypothyroidism Cognitive impairment - chronic issue     Recommendations conveyed to primary service.    Darnell Level  [facility] 11/14/2022 11:39 AM  ___________________________________________________________  CC: GI bleed  Interval History/Subjective: Patient was getting dialysis today with no complaints.   Medications:  Current Facility-Administered Medications  Medication Dose Route Frequency Provider Last Rate Last Admin   ALPRAZolam Prudy Feeler) tablet 0.5 mg  0.5 mg Oral Daily PRN Tat, Onalee Hua, MD   0.5 mg at 11/13/22 2215   alteplase (CATHFLO ACTIVASE) injection 2 mg  2 mg Intracatheter Once PRN Tat, Onalee Hua, MD       amiodarone (PACERONE) tablet 100 mg  100 mg Oral Daily Tat, David, MD   100 mg at 11/13/22 9147   anticoagulant  sodium citrate solution 5 mL  5 mL Intracatheter PRN Tat, Onalee Hua, MD       calcitRIOL (ROCALTROL) capsule 1.75 mcg  1.75 mcg Oral Q M,W,F-HD [name], MD   1.75 mcg at 11/11/22 1335   Chlorhexidine Gluconate Cloth 2 % PADS 6 each  6 each Topical Daily Willeen Niece, MD   6 each at 11/13/22 1250   cinacalcet (SENSIPAR) tablet 30 mg  30 mg Oral Q M,W,F-HD [name], MD   30 mg at 11/11/22 1341   dicyclomine (BENTYL) capsule 10 mg  10 mg Oral Q12H PRN Dolores Frame, MD       diphenhydrAMINE (BENADRYL) capsule 50 mg  50 mg Oral Once Gelene Mink, NP       Or   diphenhydrAMINE (BENADRYL) injection 50 mg  50 mg Intravenous Once Gelene Mink, NP       gabapentin (NEURONTIN) capsule 100 mg  100 mg Oral QHS Johnson, Clanford L, MD   100 mg at 11/13/22 2211   guaiFENesin-dextromethorphan (ROBITUSSIN DM) 100-10 MG/5ML syrup 5 mL  5 mL Oral Q4H PRN Tat, Onalee Hua, MD   5 mL at 11/13/22 2210   heparin injection 1,000 Units  1,000 Units Intracatheter PRN [name], MD   3,800 Units at 11/11/22 1740   hydrALAZINE (APRESOLINE) injection 10 mg  10 mg Intravenous Q4H PRN Howerter, Justin B, DO       hydrALAZINE (APRESOLINE) tablet 100 mg  100 mg Oral Q8H Leroy Sea, MD   100 mg at  11/14/22 0525   HYDROmorphone (DILAUDID) injection 0.5 mg  0.5 mg Intravenous Q4H PRN Tat, Onalee Hua, MD   0.5 mg at 11/08/22 0757   levETIRAcetam (KEPPRA) tablet 500 mg  500 mg Oral Daily Tat, David, MD   500 mg at 11/13/22 0825   levothyroxine (SYNTHROID) tablet 75 mcg  75 mcg Oral QAC breakfast Tat, Onalee Hua, MD   75 mcg at 11/14/22 0525   lidocaine (PF) (XYLOCAINE) 1 % injection 10 mL  10 mL Infiltration Once Simonne Come, MD       lidocaine (PF) (XYLOCAINE) 1 % injection 5 mL  5 mL Intradermal PRN Tat, Onalee Hua, MD       lidocaine-prilocaine (EMLA) cream 1 Application  1 Application Topical PRN Tat, Onalee Hua, MD       lisinopril (ZESTRIL) tablet 40 mg  40 mg Oral Daily Leroy Sea, MD       magic mouthwash  5 mL Oral  TID [name], MD   5 mL at 11/13/22 2211   metoprolol tartrate (LOPRESSOR) tablet 12.5 mg  12.5 mg Oral BID Tat, Onalee Hua, MD   12.5 mg at 11/13/22 2210   octreotide (SANDOSTATIN) injection 100 mcg  100 mcg Subcutaneous Q12H Tat, Onalee Hua, MD   100 mcg at 11/13/22 2230   ondansetron (ZOFRAN) tablet 4 mg  4 mg Oral Q6H PRN Tat, Onalee Hua, MD   4 mg at 10/30/22 1501   Or   ondansetron (ZOFRAN) injection 4 mg  4 mg Intravenous Q6H PRN Tat, Onalee Hua, MD   4 mg at 11/01/22 1936   oxyCODONE (Oxy IR/ROXICODONE) immediate release tablet 5-7.5 mg  5-7.5 mg Oral Q4H PRN [name], Clanford L, MD   5 mg at 11/09/22 2328   pentafluoroprop-tetrafluoroeth (GEBAUERS) aerosol 1 Application  1 Application Topical PRN Tat, Onalee Hua, MD       polyethylene glycol (MIRALAX / GLYCOLAX) packet 17 g  17 g Oral TID Brooke Bonito L, NP   17 g at 11/13/22 2211   rosuvastatin (CRESTOR) tablet 10 mg  10 mg Oral Daily Tat, David, MD   10 mg at 11/13/22 0826   sevelamer carbonate (RENVELA) tablet 800 mg  800 mg Oral TID WC Annie Sable, MD   800 mg at 11/13/22 1735   [START ON 11/15/2022] sodium zirconium cyclosilicate (LOKELMA) packet 5 g  5 g Oral Q48H Leroy Sea, MD          Review of Systems: 10 systems reviewed and negative except per interval history/subjective  Physical Exam: Vitals:   11/14/22 1130 11/14/22 1132  BP: (!) 144/59 (!) 141/59  Pulse: 60 61  Resp: 13 11  Temp:    SpO2:  100%   No intake/output data recorded. No intake or output data in the 24 hours ending 11/14/22 1139 Constitutional: well-appearing, no acute distress ENMT: ears and nose without scars or lesions, MMM CV: normal rate, no edema Respiratory: clear to auscultation, normal work of breathing Gastrointestinal: soft, non-tender, no palpable masses or hernias Skin: no visible lesions or rashes   Test Results I personally reviewed new and old clinical labs and radiology tests Lab Results  Component Value Date   NA 128 (L)  11/13/2022   K 5.2 (H) 11/13/2022   CL 94 (L) 11/13/2022   CO2 22 11/13/2022   BUN 45 (H) 11/13/2022   CREATININE 5.78 (H) 11/13/2022   CALCIUM 8.6 (L) 11/13/2022   ALBUMIN 2.9 (L) 11/13/2022   PHOS 5.3 (H) 11/13/2022    CBC Recent Labs  Lab 11/09/22 0351 11/11/22 0407 11/13/22 0219  WBC 9.1 7.8 13.0*  HGB 10.3* 8.8* 9.2*  HCT 30.6* 26.7* 27.5*  MCV 106.3* 108.1* 105.4*  PLT 220 229 271

## 2022-11-14 NOTE — TOC Benefit Eligibility Note (Signed)
Patient Product/process development scientist completed.    The patient is currently admitted and upon discharge could be taking Sandostatin LAR Depot 20 mg Injection.  The current 30 day co-pay is $1,416.51 due to being in Coverage Gap (donut hole).   The patient is insured through [employer] Part D  This test claim was processed through [facility]- copay amounts may vary at other pharmacies due to pharmacy/plan contracts, or as the patient moves through the different stages of their insurance plan.  Roland Earl, CPHT Pharmacy Patient Advocate Specialist [facility] Patient Advocate Team Direct Number: [phone]  Fax: [phone]

## 2022-11-14 NOTE — Telephone Encounter (Signed)
Pharmacy Patient Advocate Encounter  Insurance verification completed.    The patient is insured through [employer] Part D   The patient is currently admitted and ran test claims for the following: Sandostatin LAR Depot .  Copays and coinsurance results were relayed to Inpatient clinical team.

## 2022-11-15 ENCOUNTER — Inpatient Hospital Stay (HOSPITAL_COMMUNITY): Payer: Medicare Other

## 2022-11-15 ENCOUNTER — Other Ambulatory Visit (HOSPITAL_COMMUNITY): Payer: Self-pay

## 2022-11-15 DIAGNOSIS — D649 Anemia, unspecified: Secondary | ICD-10-CM | POA: Diagnosis not present

## 2022-11-15 HISTORY — PX: IR REMOVAL TUN CV CATH W/O FL: IMG2289

## 2022-11-15 MED ORDER — LISINOPRIL 40 MG PO TABS: 40.0000 mg | ORAL_TABLET | Freq: Every day | ORAL | Status: DC

## 2022-11-15 MED ORDER — AMOXICILLIN-POT CLAVULANATE 500-125 MG PO TABS
1.0000 | ORAL_TABLET | Freq: Two times a day (BID) | ORAL | 0 refills | Status: DC
  Filled 2022-11-15: qty 10, 5d supply, fill #0

## 2022-11-15 MED ORDER — OCTREOTIDE ACETATE 20 MG IM KIT
20.0000 mg | PACK | INTRAMUSCULAR | 0 refills | Status: DC
  Filled 2022-11-15: qty 1, 28d supply, fill #0

## 2022-11-15 MED ORDER — OCTREOTIDE ACETATE 20 MG IM KIT
20.0000 mg | PACK | Freq: Once | INTRAMUSCULAR | Status: AC
  Administered 2022-11-15: 20 mg via INTRAMUSCULAR

## 2022-11-15 MED ORDER — HYDRALAZINE HCL 100 MG PO TABS: 100.0000 mg | ORAL_TABLET | Freq: Three times a day (TID) | ORAL | Status: DC

## 2022-11-15 MED ORDER — OCTREOTIDE ACETATE 20 MG IM KIT
30.0000 mg | PACK | INTRAMUSCULAR | 0 refills | Status: DC
  Filled 2022-11-15: qty 1, 28d supply, fill #0

## 2022-11-15 MED ORDER — TRAMADOL HCL 50 MG PO TABS
50.0000 mg | ORAL_TABLET | Freq: Two times a day (BID) | ORAL | 0 refills | Status: DC | PRN
  Filled 2022-11-15: qty 4, 2d supply, fill #0

## 2022-11-15 MED ORDER — OCTREOTIDE ACETATE 30 MG IM KIT: 30.0000 mg | PACK | INTRAMUSCULAR | 0 refills | Status: DC

## 2022-11-15 MED ORDER — ALPRAZOLAM 0.5 MG PO TABS: 0.5000 mg | ORAL_TABLET | Freq: Every day | ORAL | 0 refills | Status: DC | PRN

## 2022-11-15 MED ORDER — AMOXICILLIN-POT CLAVULANATE 500-125 MG PO TABS
1.0000 | ORAL_TABLET | Freq: Two times a day (BID) | ORAL | Status: DC
  Administered 2022-11-15: 1 via ORAL
  Filled 2022-11-15 (×2): qty 1

## 2022-11-15 MED ORDER — OCTREOTIDE ACETATE 30 MG IM KIT: 30.0000 mg | PACK | Freq: Once | INTRAMUSCULAR | Status: DC

## 2022-11-15 MED ORDER — LEVETIRACETAM 500 MG PO TABS: 500.0000 mg | ORAL_TABLET | Freq: Every day | ORAL | Status: DC

## 2022-11-15 MED ORDER — ALPRAZOLAM 0.5 MG PO TABS
0.5000 mg | ORAL_TABLET | Freq: Every day | ORAL | 0 refills | Status: DC | PRN
  Filled 2022-11-15: qty 5, 5d supply, fill #0

## 2022-11-15 MED ORDER — CARVEDILOL 3.125 MG PO TABS
3.1250 mg | ORAL_TABLET | Freq: Two times a day (BID) | ORAL | 0 refills | Status: DC
  Filled 2022-11-15: qty 60, 30d supply, fill #0

## 2022-11-15 MED ORDER — TRAMADOL HCL 50 MG PO TABS: 50.0000 mg | ORAL_TABLET | Freq: Two times a day (BID) | ORAL | 0 refills | Status: DC | PRN

## 2022-11-15 MED ORDER — LEVETIRACETAM 500 MG PO TABS
500.0000 mg | ORAL_TABLET | Freq: Every day | ORAL | 0 refills | Status: DC
  Filled 2022-11-15: qty 30, 30d supply, fill #0

## 2022-11-15 MED ORDER — CARVEDILOL 3.125 MG PO TABS: 3.1250 mg | ORAL_TABLET | Freq: Two times a day (BID) | ORAL | Status: DC

## 2022-11-15 MED ORDER — OCTREOTIDE ACETATE 100 MCG/ML IJ SOLN
100.0000 ug | Freq: Two times a day (BID) | INTRAMUSCULAR | Status: DC
  Administered 2022-11-15: 100 ug via SUBCUTANEOUS
  Filled 2022-11-15: qty 1

## 2022-11-15 MED ORDER — LISINOPRIL 40 MG PO TABS
40.0000 mg | ORAL_TABLET | Freq: Every day | ORAL | 0 refills | Status: DC
  Filled 2022-11-15: qty 30, 30d supply, fill #0

## 2022-11-15 MED ORDER — AMOXICILLIN-POT CLAVULANATE 500-125 MG PO TABS: 1.0000 | ORAL_TABLET | Freq: Two times a day (BID) | ORAL | Status: DC

## 2022-11-15 NOTE — TOC Transition Note (Addendum)
Transition of Care [facility]) - CM/SW Discharge Note   Patient Details  Name: Shawna [name] MRN: [id number] Date of Birth: 1940-09-29  [facility] CM/SW Contact:  Gordy Clement, RN Phone Number: 11/15/2022, 9:34 AM   Clinical Narrative:     Update:4:02 PM   CM called patients husband to discuss concerns brought to her attention regarding DC. Although previous CM had discussed the DC plan with Husband and Daughter, it was brought to CM attention this afternoon that Husband had concerns  When CM called, Husband voiced that his wife :can't walk". CM reassured Husband that note states she needs some assistance with walking and transferring bt that she is walking.  CM also reassured Husband that Home Health has been oprdered and his Wife will get the support in home with PT and OT.  Husband questioned why Wife was not going to SNF.  CM shared that per CSW, his Wife did not qualify- It was not the recommendation. Husband did agree to comepick up Wife    Update: 1:33 PM  HH Orders have been faxed to [facility]   Patient to DC to home today with Home Health. DC Summary has been faxed to [facility]- CM has spoken to office to notify of DC today.  Family to transport       Barriers to Discharge: Continued Medical Work up   Patient Goals and CMS Choice   Choice offered to / list presented to : Adult Children  Discharge Placement                         Discharge Plan and Services Additional resources added to the After Visit Summary for   In-house Referral: Clinical Social Work                                   Social Determinants of Health (SDOH) Interventions SDOH Screenings   Food Insecurity: No Food Insecurity (10/29/2022)  Housing: Low Risk  (10/29/2022)  Transportation Needs: No Transportation Needs (10/29/2022)  Utilities: Not At Risk (10/29/2022)  Financial Resource Strain: Low Risk  (12/14/2018)  Physical Activity: Unknown (12/14/2018)  Social  Connections: Unknown (12/14/2018)  Stress: No Stress Concern Present (12/14/2018)  Tobacco Use: Low Risk  (11/12/2022)     Readmission Risk Interventions    10/31/2022   12:45 PM 08/29/2022    4:47 PM 11/12/2021    8:56 AM  Readmission Risk Prevention Plan  Transportation Screening Complete Complete Complete  Medication Review Oceanographer) Complete Complete Complete  PCP or Specialist appointment within 3-5 days of discharge  Complete Complete  HRI or Home Care Consult Complete Complete Complete  SW Recovery Care/Counseling Consult Complete Complete Complete  Palliative Care Screening Not Applicable Not Applicable Not Applicable  Skilled Nursing Facility Not Applicable Complete Complete

## 2022-11-15 NOTE — Discharge Instructions (Signed)
Follow with Primary MD Practice, [facility] in 7 days   Get CBC, CMP, 2 view Chest X ray -  checked next visit with your primary MD or SNF MD   Activity: As tolerated with Full fall precautions use walker/cane & assistance as needed  Disposition SNF  Diet: Renal diet with 1200 cc of fluid restriction per day  Special Instructions: If you have smoked or chewed Tobacco  in the last 2 yrs please stop smoking, stop any regular Alcohol  and or any Recreational drug use.  On your next visit with your primary care physician please Get Medicines reviewed and adjusted.  Please request your Prim.MD to go over all Hospital Tests and Procedure/Radiological results at the follow up, please get all Hospital records sent to your Prim MD by signing hospital release before you go home.  If you experience worsening of your admission symptoms, develop shortness of breath, life threatening emergency, suicidal or homicidal thoughts you must seek medical attention immediately by calling 911 or calling your MD immediately  if symptoms less severe.  You Must read complete instructions/literature along with all the possible adverse reactions/side effects for all the Medicines you take and that have been prescribed to you. Take any new Medicines after you have completely understood and accpet all the possible adverse reactions/side effects.

## 2022-11-15 NOTE — Telephone Encounter (Signed)
Sw at Hospital to fill out forms for patient assistance for the patient while she is in patient. I have reached out to vital care to see if they are able to help with administration.  

## 2022-11-15 NOTE — Progress Notes (Signed)
Nephrology Follow-Up Consult note   Assessment/Recommendations: Shawna [name] is a/an 82 y.o. female with a past medical history significant for HTN, CAD status post CABG, HLD, atrial fibrillation, GI bleeding, ESRD admitted for GI bleeding.       OP HD: MWF [facility]  Needs updating (jan 2024) --> 3.5h  59.5kg  bfr 300  2/2.5 bath [facility]        Summary: Pt is a 82 y.o. yo female with history of HTN, CAD status post CABG, HLD, A-fib, GI bleed, ESRD on HD presented with generalized weakness/abdomen pain, following to manage ESRD.      Assessment/ Plan: Recurrent GI bleed w/ ABL anemia - likely GI source, required transfusion.  EGD/enteroscopy during this admission showing nonbleeding gastric ulcer, duodenal polyp sp biopsy. Getting octreotide.  Abd pain - possible mesenteric ischemia.  Management per primary Hyperkalemia -management with dialysis.  Resolved ESRD - MWF at [facility].  Maintain schedule MBD ckd - is on calcitriol, Cinacalcet and just resumed Renvela. Follow.  HTN/volume- UF with HD and continue current antihypertensives. Wt's have been stable between 56- 60kg while at [facility] and here at [facility].  Afib - on amio, BB CAD h/o CABG Hypothyroidism Cognitive impairment - chronic issue     Recommendations conveyed to primary service.    Darnell Level [facility] 11/15/2022 10:46 AM  ___________________________________________________________  CC: GI bleed  Interval History/Subjective: Patient has no complaints today.  Patient being discharged today   Medications:  Current Facility-Administered Medications  Medication Dose Route Frequency Provider Last Rate Last Admin   ALPRAZolam Prudy Feeler) tablet 0.5 mg  0.5 mg Oral Daily PRN Tat, Onalee Hua, MD   0.5 mg at 11/13/22 2215   alteplase (CATHFLO ACTIVASE) injection 2 mg  2 mg Intracatheter Once PRN Tat, Onalee Hua, MD       amiodarone (PACERONE) tablet 100 mg  100 mg Oral Daily Tat, David, MD   100 mg at 11/15/22 1013    amoxicillin-clavulanate (AUGMENTIN) 500-125 MG per tablet 1 tablet  1 tablet Oral BID Leroy Sea, MD   1 tablet at 11/15/22 1011   anticoagulant sodium citrate solution 5 mL  5 mL Intracatheter PRN Tat, Onalee Hua, MD       calcitRIOL (ROCALTROL) capsule 1.75 mcg  1.75 mcg Oral Q M,W,F-HD [name], MD   1.75 mcg at 11/14/22 1321   carvedilol (COREG) tablet 3.125 mg  3.125 mg Oral BID WC Leroy Sea, MD       Chlorhexidine Gluconate Cloth 2 % PADS 6 each  6 each Topical Daily Willeen Niece, MD   6 each at 11/15/22 1032   cinacalcet (SENSIPAR) tablet 30 mg  30 mg Oral Q M,W,F-HD [name], MD   30 mg at 11/14/22 1322   dicyclomine (BENTYL) capsule 10 mg  10 mg Oral Q12H PRN Dolores Frame, MD       diphenhydrAMINE (BENADRYL) capsule 50 mg  50 mg Oral Once Gelene Mink, NP       Or   diphenhydrAMINE (BENADRYL) injection 50 mg  50 mg Intravenous Once Gelene Mink, NP       gabapentin (NEURONTIN) capsule 100 mg  100 mg Oral QHS Johnson, Clanford L, MD   100 mg at 11/14/22 2116   guaiFENesin-dextromethorphan (ROBITUSSIN DM) 100-10 MG/5ML syrup 5 mL  5 mL Oral Q4H PRN Tat, Onalee Hua, MD   5 mL at 11/13/22 2210   heparin injection 1,000 Units  1,000 Units Intracatheter PRN Tat,  Onalee Hua, MD   3,800 Units at 11/11/22 1740   hydrALAZINE (APRESOLINE) injection 10 mg  10 mg Intravenous Q4H PRN Howerter, Justin B, DO       hydrALAZINE (APRESOLINE) tablet 100 mg  100 mg Oral Q8H Leroy Sea, MD   100 mg at 11/15/22 2956   HYDROmorphone (DILAUDID) injection 0.5 mg  0.5 mg Intravenous Q4H PRN Tat, Onalee Hua, MD   0.5 mg at 11/08/22 0757   levETIRAcetam (KEPPRA) tablet 500 mg  500 mg Oral Daily Tat, David, MD   500 mg at 11/15/22 1012   levothyroxine (SYNTHROID) tablet 75 mcg  75 mcg Oral QAC breakfast Tat, Onalee Hua, MD   75 mcg at 11/15/22 0636   lidocaine (PF) (XYLOCAINE) 1 % injection 10 mL  10 mL Infiltration Once Simonne Come, MD       lidocaine (PF) (XYLOCAINE) 1 % injection 5 mL  5 mL  Intradermal PRN Tat, Onalee Hua, MD       lidocaine-prilocaine (EMLA) cream 1 Application  1 Application Topical PRN Tat, Onalee Hua, MD       lisinopril (ZESTRIL) tablet 40 mg  40 mg Oral Daily Leroy Sea, MD   40 mg at 11/15/22 1012   magic mouthwash  5 mL Oral TID Tat, Onalee Hua, MD   5 mL at 11/15/22 1013   octreotide (SANDOSTATIN) injection 100 mcg  100 mcg Subcutaneous BID Leroy Sea, MD   100 mcg at 11/15/22 1012   ondansetron (ZOFRAN) tablet 4 mg  4 mg Oral Q6H PRN Tat, Onalee Hua, MD   4 mg at 10/30/22 1501   Or   ondansetron (ZOFRAN) injection 4 mg  4 mg Intravenous Q6H PRN Tat, Onalee Hua, MD   4 mg at 11/01/22 1936   oxyCODONE (Oxy IR/ROXICODONE) immediate release tablet 5-7.5 mg  5-7.5 mg Oral Q4H PRN Johnson, Clanford L, MD   5 mg at 11/14/22 1153   pentafluoroprop-tetrafluoroeth (GEBAUERS) aerosol 1 Application  1 Application Topical PRN Tat, Onalee Hua, MD       polyethylene glycol (MIRALAX / GLYCOLAX) packet 17 g  17 g Oral TID Brooke Bonito L, NP   17 g at 11/13/22 2211   rosuvastatin (CRESTOR) tablet 10 mg  10 mg Oral Daily Tat, David, MD   10 mg at 11/15/22 1012   sevelamer carbonate (RENVELA) tablet 800 mg  800 mg Oral TID WC Annie Sable, MD   800 mg at 11/15/22 1013      Review of Systems: 10 systems reviewed and negative except per interval history/subjective  Physical Exam: Vitals:   11/15/22 0000 11/15/22 0809  BP: (!) 158/54 (!) 154/55  Pulse: (!) 56   Resp: 13   Temp:    SpO2: 99% 99%   No intake/output data recorded.  Intake/Output Summary (Last 24 hours) at 11/15/2022 1046 Last data filed at 11/14/2022 1200 Gross per 24 hour  Intake --  Output 2500 ml  Net -2500 ml   Constitutional: well-appearing, no acute distress ENMT: ears and nose without scars or lesions, MMM CV: normal rate, no edema Respiratory: clear to auscultation, normal work of breathing Gastrointestinal: soft, non-tender, no palpable masses or hernias Skin: no visible lesions or  rashes   Test Results I personally reviewed new and old clinical labs and radiology tests Lab Results  Component Value Date   NA 127 (L) 11/14/2022   K 4.8 11/14/2022   CL 89 (L) 11/14/2022   CO2 22 11/14/2022   BUN 76 (H) 11/14/2022   CREATININE 7.76 (  H) 11/14/2022   CALCIUM 8.5 (L) 11/14/2022   ALBUMIN 3.0 (L) 11/14/2022   PHOS 7.0 (H) 11/14/2022    CBC Recent Labs  Lab 11/11/22 0407 11/13/22 0219 11/14/22 0840  WBC 7.8 13.0* 18.7*  HGB 8.8* 9.2* 8.9*  HCT 26.7* 27.5* 26.0*  MCV 108.1* 105.4* 105.7*  PLT 229 271 297

## 2022-11-15 NOTE — Progress Notes (Signed)
D/C order noted. Contacted [facility] and spoke to [place]. Clinic advised pt will d/c today and should resume care tomorrow. D/C summary and last renal note faxed to clinic for continuation of care.   Olivia Canter Renal Navigator [phone]

## 2022-11-15 NOTE — Progress Notes (Signed)
Physical Therapy Treatment Patient Details Name: Shawna [name] MRN: [id number] DOB: 21-Jan-1941 Today's Date: 11/15/2022   History of Present Illness Pt is 82 yo female admitted to [facility]  on 10/28/22 with weakness and Hgb of 7.1.   Patient was transfused PRBC-evaluated by GI-underwent small bowel enteroscopy which showed nonbleeding gastric ulcer, further hospital course complicated by seizures, and worsening abdominal pain with concern for chronic mesenteric ischemia.  While at [facility]-patient loss IV access. Ultimately, patient was  transferred to [facility] on 5/3 for placement of IV access and is s/p R femoral non-tunneled CVC on 11/12/22. She has hx including but not limited to ESRD on HD MWF, CAD , CABG, HTN, HLD, PAF, recurrent GI bleeds.    PT Comments    Pt tolerated today's session well, able to progress ambulation distance and focused on proper hand placement for safe sit<>stand transfers. Pt ambulating with minG-minA, assist required for RW management, noted poor obstacle negotiation with fatigue. Pt noted to have poorly controlled descent into sitting with hands remaining on RW, cued and with repetition, pt able to reach back for safe descent into chair. Acute PT will continue to follow up with pt to progress mobility, discharge recommendations remain appropriate.     Recommendations for follow up therapy are one component of a multi-disciplinary discharge planning process, led by the attending physician.  Recommendations may be updated based on patient status, additional functional criteria and insurance authorization.  Follow Up Recommendations       Assistance Recommended at Discharge Frequent or constant Supervision/Assistance  Patient can return home with the following A little help with walking and/or transfers;A little help with bathing/dressing/bathroom;Assistance with cooking/housework   Equipment Recommendations  None recommended by PT    Recommendations for Other Services        Precautions / Restrictions Precautions Precautions: Fall Precaution Comments: R femoral catheter 5/4 Restrictions Weight Bearing Restrictions: No     Mobility  Bed Mobility Overal bed mobility: Needs Assistance Bed Mobility: Supine to Sit     Supine to sit: HOB elevated, Supervision     General bed mobility comments: supervision for safety with increased time and use of bedrails    Transfers Overall transfer level: Needs assistance Equipment used: Rolling walker (2 wheels) Transfers: Sit to/from Stand Sit to Stand: Min guard           General transfer comment: increased time but completing with minG, cueing throughout for proper hand placement as pt has uncontrolled descent initially. Performing 5 trials from chair with fading cueing, able to properly place hands without cueing by last trial    Ambulation/Gait Ambulation/Gait assistance: Min guard, Min assist Gait Distance (Feet): 75 Feet Assistive device: Rolling walker (2 wheels) Gait Pattern/deviations: Step-through pattern, Decreased stride length, Trunk flexed, Drifts right/left Gait velocity: decreased     General Gait Details: cueing for upright posture and proximity to RW as pt ambulating at an increased distance behind walker. inermittent minA for RW management as pt with path deviation and poor obstacle management, noted especially with fatigue   Stairs             Wheelchair Mobility    Modified Rankin (Stroke Patients Only)       Balance Overall balance assessment: Needs assistance Sitting-balance support: Feet supported, Bilateral upper extremity supported Sitting balance-Leahy Scale: Fair     Standing balance support: Bilateral upper extremity supported, During functional activity, Reliant on assistive device for balance Standing balance-Leahy Scale: Poor Standing balance comment: RW  and min guard/min A                            Cognition Arousal/Alertness:  Awake/alert Behavior During Therapy: WFL for tasks assessed/performed Overall Cognitive Status: History of cognitive impairments - at baseline                                 General Comments: follows commands well and is oriented, looking for her daughter initially, wanting to check the bathroom a few times but daughter not seen        Exercises      General Comments General comments (skin integrity, edema, etc.): VSS on room air      Pertinent Vitals/Pain Pain Assessment Pain Assessment: No/denies pain    Home Living                          Prior Function            PT Goals (current goals can now be found in the care plan section) Acute Rehab PT Goals Patient Stated Goal: return home PT Goal Formulation: With patient/family Time For Goal Achievement: 11/27/22 Potential to Achieve Goals: Good Progress towards PT goals: Progressing toward goals    Frequency    Min 3X/week      PT Plan Current plan remains appropriate    Co-evaluation              AM-PAC PT "6 Clicks" Mobility   Outcome Measure  Help needed turning from your back to your side while in a flat bed without using bedrails?: A Little Help needed moving from lying on your back to sitting on the side of a flat bed without using bedrails?: A Little Help needed moving to and from a bed to a chair (including a wheelchair)?: A Little Help needed standing up from a chair using your arms (e.g., wheelchair or bedside chair)?: A Little Help needed to walk in hospital room?: A Little Help needed climbing 3-5 steps with a railing? : A Little 6 Click Score: 18    End of Session Equipment Utilized During Treatment: Gait belt Activity Tolerance: Patient tolerated treatment well Patient left: in chair;with call bell/phone within reach;with chair alarm set Nurse Communication: Mobility status PT Visit Diagnosis: Other abnormalities of gait and mobility (R26.89);Muscle weakness  (generalized) (M62.81)     Time: 1610-9604 PT Time Calculation (min) (ACUTE ONLY): 28 min  Charges:  $Gait Training: 8-22 mins $Therapeutic Activity: 8-22 mins                     Shawna [name], PT DPT Acute Rehabilitation Services Office [phone]    Shawna [name] 11/15/2022, 3:38 PM

## 2022-11-15 NOTE — Discharge Summary (Addendum)
Shawna [name]:[id number] DOB: [birth year] DOA: 10/28/2022  PCP: [facility]  Admit date: 10/28/2022  Discharge date: 11/15/2022  Admitted From: SNF   Disposition:  Home   Recommendations for Outpatient Follow-up:   Follow up with PCP in 1-2 weeks  PCP Please obtain BMP/CBC, 2 view CXR in 1week,  (see Discharge instructions)   PCP Please follow up on the following pending results:    Home Health: None   Equipment/Devices: None  Discharge Condition: Stable    CODE STATUS: Full    Diet Recommendation: Renal, 1.2 L fluid restriction per day  Chief Complaint  Patient presents with   Weakness     Brief history of present illness from the day of admission and additional interim summary    82 y.o.  female with history of ESRD on HD MWF, CAD s/p CABG, HTN, HLD, PAF, recurrent GI bleeding due to AVMs-who was initially admitted to [facility] on 4/19 with weakness-Hb showed hemoglobin of 7.1.  Patient was transfused PRBC-evaluated by GI-underwent small bowel enteroscopy which showed nonbleeding gastric ulcer, further hospital course complicated by seizures, and worsening abdominal pain with concern for chronic mesenteric ischemia.  While at [facility]-patient loss IV access-and any IV access was unable to be obtained, hence patient was unable to get a CT angiogram of the abdomen.  Ultimately, patient was  transferred to [facility] on 5/3 for placement of IV access ( CT angiogram abdomen) and IR evaluation.  See below for further details.   Significant events: 4/19>> admit to [facility] for anemia/weakness. 4/21>> AMS-seizure-like activity-Keppra started-neurology consulted 4/23>> small bowel enteroscopy 5/03>> transferred to [facility]   Significant studies: 4/21>> CT head: No ICH/infarct.  Subdural hygromas overlying both cerebral  hemispheres. 4/22>> EEG: No seizures 4/22>> MRI brain: No CVA-redemonstrated left> right subdural hygromas 4/24>> CT abdomen/pelvis no contrast: Ileus versus mild partial obstruction, distended gallbladder. 4/25>> RUQ ultrasound: No biliary ductal dilatation-gallbladder wall is thickened-no gallbladder stones.  Negative Murphy sign. 5/03>> [place] mesenteric arteries: Elevated peak systolic velocity in the superior mesenteric artery suspicious for significant stenosis.   Significant microbiology data: 4/24>> blood culture: No growth   Procedures: 4/23>> small bowel enteroscopy: Nonbleeding gastric ulcer with clean base, nonbleeding duodenal diverticula, jejunal polyp.   Consults: GI General surgery Neurology IR Nephrology                                                                    Hospital Course   Recurrent GI bleeding with acute on chronic blood loss anemia Hb now stable-required 1 unit of PRBC on 4/20 Small bowel enteroscopy as above Continue octreotide-per GI-this will need to be continued on discharge, long-acting form will be given 1 dose prior to discharge thereafter follow-up with her primary gastroenterologist at [facility] every 4 weeks, follow-up  with GI within a week of discharge.  No signs of ongoing bleeding.   Seizure disorder No further seizures Evaluated by neuro at [facility]-continue Keppra Neuroimaging as above   Worsening generalized abdominal pain-possible chronic mesenteric ischemia CTA done, case discussed with Dr. Grace Isaac IR, patient is completely symptom-free currently, abdominal exam is benign, for now outpatient follow-up with IR and 1 to 2 weeks, will plan for discharge, right femoral catheter to be removed today prior to discharge.  Will consult IR for the same.   ESRD on HD MWF Nephrology at [facility] made aware of patient's transfer HD per nephrology Monitor electrolytes intermittently at [facility]   PAF/history of SVT Sinus rhythm Continue  amiodarone/metoprolol Not a candidate for anticoagulation due to recurrent GI bleeding   HTN BP relatively high currently on combination of beta-blocker, hydralazine, ACE inhibitor, dose adjusted on 11/14/2022 for better control likely running high due to octreotide.  Monitor and adjust at SNF.   HLD Statin   CAD s/p CABG/PCI No anginal symptoms ASA on hold due to GI bleeding/anemia Continue statin/beta-blocker   Hypothyroidism Continue Synthroid   Cognitive impairment Minimal, avoid excessive use of narcotics and benzodiazepines, supportive care.    Discharge diagnosis     Principal Problem:   Symptomatic anemia Active Problems:   Acute on chronic blood loss anemia   Acute metabolic encephalopathy   Constipation   Abdominal wall pain in left flank   Essential hypertension   End-stage renal disease on hemodialysis (HCC)   Intestinal ischemia (HCC)   CAD (coronary artery disease)   Diabetes mellitus type 2 in nonobese [facility])   Acquired hypothyroidism   Atrial fibrillation, chronic (HCC)   History of arteriovenous malformation (AVM)   Anemia of chronic disease   GI bleed   Ileus (HCC)   Periumbilical abdominal pain   Generalized abdominal pain    Discharge instructions    Discharge Instructions     Discharge instructions   Complete by: As directed    Follow with Primary MD [facility] in 7 days   Get CBC, CMP, 2 view Chest X ray -  checked next visit with your primary MD or SNF MD   Activity: As tolerated with Full fall precautions use walker/cane & assistance as needed  Disposition SNF  Diet: Renal diet with 1200 cc of fluid restriction per day  Special Instructions: If you have smoked or chewed Tobacco  in the last 2 yrs please stop smoking, stop any regular Alcohol  and or any Recreational drug use.  On your next visit with your primary care physician please Get Medicines reviewed and adjusted.  Please request your Prim.MD to go over all  Hospital Tests and Procedure/Radiological results at the follow up, please get all Hospital records sent to your Prim MD by signing hospital release before you go home.  If you experience worsening of your admission symptoms, develop shortness of breath, life threatening emergency, suicidal or homicidal thoughts you must seek medical attention immediately by calling 911 or calling your MD immediately  if symptoms less severe.  You Must read complete instructions/literature along with all the possible adverse reactions/side effects for all the Medicines you take and that have been prescribed to you. Take any new Medicines after you have completely understood and accpet all the possible adverse reactions/side effects.   Increase activity slowly   Complete by: As directed    No wound care   Complete by: As directed        Discharge  Medications   Allergies as of 11/15/2022       Reactions   Amlodipine Swelling   Aspirin Other (See Comments)   High Doses Mess up her stomach; "makes my bowels have blood in them". Takes 81 mg EC Aspirin    Nitrofurantoin Hives   Ranexa [ranolazine] Other (See Comments)   Myoclonus-hospitalized    Bactrim [sulfamethoxazole-trimethoprim] Rash   Contrast Media [iodinated Contrast Media] Itching   Iron Itching, Other (See Comments)   "they gave me iron in dialysis; had to give me Benadryl cause I had to have the iron" (05/02/2012)   Gabapentin Other (See Comments)   Unknown reaction   Iron Sucrose Other (See Comments)   Unknown   Sucroferric Oxyhydroxide Other (See Comments)   Unknown   Levaquin [levofloxacin In D5w] Rash   Pantoprazole Rash   Plavix [clopidogrel Bisulfate] Rash   Protonix [pantoprazole Sodium] Rash   Venofer [ferric Oxide] Itching, Other (See Comments)   Patient reports using Benadryl prior to doses as [facility]        Medication List     STOP taking these medications    azithromycin 250 MG tablet Commonly known as:  ZITHROMAX   cefUROXime 250 MG tablet Commonly known as: CEFTIN   doxycycline 100 MG capsule Commonly known as: VIBRAMYCIN   hydrOXYzine 100 MG capsule Commonly known as: VISTARIL   methylPREDNISolone 4 MG Tbpk tablet Commonly known as: MEDROL DOSEPAK   metoprolol tartrate 25 MG tablet Commonly known as: LOPRESSOR   midodrine 5 MG tablet Commonly known as: PROAMATINE       TAKE these medications    ALPRAZolam 0.5 MG tablet Commonly known as: XANAX Take 1 tablet (0.5 mg total) by mouth daily as needed for anxiety.   amiodarone 100 MG tablet Commonly known as: PACERONE Take 1 tablet (100 mg total) by mouth daily.   amoxicillin-clavulanate 500-125 MG tablet Commonly known as: AUGMENTIN Take 1 tablet by mouth 2 (two) times daily.   Aspirin 81 MG Caps Take 1 capsule by mouth daily at 12 noon.   atorvastatin 10 MG tablet Commonly known as: LIPITOR Take 1 tablet by mouth at bedtime.   benzonatate 100 MG capsule Commonly known as: TESSALON Take 100 mg by mouth 3 (three) times daily as needed for cough.   carvedilol 3.125 MG tablet Commonly known as: COREG Take 1 tablet (3.125 mg total) by mouth 2 (two) times daily with a meal.   cyclobenzaprine 5 MG tablet Commonly known as: FLEXERIL Take 5 mg by mouth 3 (three) times daily as needed.   folic acid 1 MG tablet Commonly known as: FOLVITE Take 1 mg by mouth daily.   gabapentin 100 MG capsule Commonly known as: NEURONTIN TAKE 1 CAPSULE BY MOUTH IN THE EVENING   guaiFENesin-codeine 100-10 MG/5ML syrup Take 5 mLs by mouth every 4 (four) hours as needed for cough.   hydrALAZINE 100 MG tablet Commonly known as: APRESOLINE Take 1 tablet (100 mg total) by mouth every 8 (eight) hours.   hydrOXYzine 25 MG tablet Commonly known as: ATARAX Take 25 mg by mouth every 6 (six) hours as needed for itching.   levETIRAcetam 500 MG tablet Commonly known as: KEPPRA Take 1 tablet (500 mg total) by mouth daily.    levothyroxine 75 MCG tablet Commonly known as: SYNTHROID Take 1 tablet (75 mcg total) by mouth daily before breakfast.   lisinopril 40 MG tablet Commonly known as: ZESTRIL Take 1 tablet (40 mg total) by mouth daily. What  changed:  medication strength how much to take   multivitamin Tabs tablet Take 1 tablet by mouth daily.   nitroGLYCERIN 0.4 MG SL tablet Commonly known as: NITROSTAT Place 0.4 mg under the tongue every 5 (five) minutes as needed for chest pain.   octreotide 20 MG injection Commonly known as: SANDOSTATIN LAR Inject 20 mg into the muscle every 28 (twenty-eight) days.   omeprazole 40 MG capsule Commonly known as: PRILOSEC Take 1 capsule (40 mg total) by mouth daily.   ondansetron 4 MG disintegrating tablet Commonly known as: ZOFRAN-ODT Take 4 mg by mouth every 8 (eight) hours as needed for nausea or vomiting.   rosuvastatin 10 MG tablet Commonly known as: CRESTOR Take 1 tablet by mouth once daily   sevelamer carbonate 800 MG tablet Commonly known as: RENVELA Take 3,200 mg by mouth in the morning and at bedtime.   traMADol 50 MG tablet Commonly known as: ULTRAM Take 1 tablet (50 mg total) by mouth every 12 (twelve) hours as needed for moderate pain. What changed: when to take this          Follow-up Information     [facility] Follow up.   Why: Will contact you to schedule home health visits. Contact information: [phone]        Simonne Come, MD Follow up.   Specialties: Interventional Radiology, Radiology Why: If you would like to proceed with further work up/discussion of possible mesenteric ischemia please call [phone] Monday-Friday between 8a-4p to schedule a consultation. Contact information: [address], [address] [place] [address] [phone]         Marguerita Merles, Reuel Boom, MD. Schedule an appointment as soon as possible for a visit in 1 week(s).   Specialty: Gastroenterology Contact  information: [address] [place] [address] [phone]         [facility]. Schedule an appointment as soon as possible for a visit in 1 week(s).   Contact information: [address] [place] [address] [phone]                 Major procedures and Radiology Reports - PLEASE review detailed and final reports thoroughly  -     DG Chest [facility] 1 View  Result Date: 11/15/2022 CLINICAL DATA:  Shortness of breath.  History of anemia. EXAM: PORTABLE CHEST 1 VIEW COMPARISON:  10/28/2022 FINDINGS: Status post median sternotomy and CABG procedure. Left sided dialysis catheter noted with tips in the right atrium. Stable mild cardiac enlargement. No pleural effusion, interstitial edema or airspace disease. No acute bone abnormality noted. IMPRESSION: No acute cardiopulmonary abnormalities. Electronically Signed   By: Signa Kell M.D.   On: 11/15/2022 06:53   CT ANGIO GI BLEED  Result Date: 11/13/2022 CLINICAL DATA:  82 year old female with history of possible mesenteric ischemia. EXAM: CTA ABDOMEN AND PELVIS WITHOUT AND WITH CONTRAST TECHNIQUE: Multidetector CT imaging of the abdomen and pelvis was performed using the standard protocol during bolus administration of intravenous contrast. Multiplanar reconstructed images and MIPs were obtained and reviewed to evaluate the vascular anatomy. RADIATION DOSE REDUCTION: This exam was performed according to the departmental dose-optimization program which includes automated exposure control, adjustment of the mA and/or kV according to patient size and/or use of iterative reconstruction technique. CONTRAST:  80mL OMNIPAQUE IOHEXOL 350 MG/ML SOLN COMPARISON:  CT of the abdomen and pelvis 11/01/2012. FINDINGS: VASCULAR Aorta: Extensive aortic atherosclerosis. Normal caliber aorta without aneurysm, dissection, vasculitis or significant stenosis. Celiac:  Atherosclerosis resulting in moderate stenosis at the ostium.  Otherwise patent without evidence of aneurysm, dissection, vasculitis or other significant stenosis. SMA: Atherosclerosis resulting in moderate stenosis at the ostium. Otherwise patent without evidence of aneurysm, dissection, vasculitis or significant stenosis. Renals: Both renal arteries are patent but small caliber, with extensive atherosclerosis at the ostium of both vessels. IMA: Patent without evidence of aneurysm, dissection, vasculitis or significant stenosis. Inflow: Patent without evidence of aneurysm, dissection, vasculitis or significant stenosis. Proximal Outflow: Bilateral common femoral and visualized portions of the superficial and profunda femoral arteries are patent without evidence of aneurysm, dissection, vasculitis or significant stenosis. Veins: No obvious venous abnormality within the limitations of this arterial phase study. Right femoral central venous catheter with tip extending into the intrahepatic portion of the inferior vena cava. Review of the MIP images confirms the above findings. NON-VASCULAR Lower chest: Small bilateral pleural effusions lying dependently with areas of passive subsegmental atelectasis in the lower lobes of the lungs bilaterally. Cardiomegaly. Atherosclerotic calcifications are noted in the descending thoracic aorta as well as the left anterior descending, left circumflex and right coronary arteries. Hepatobiliary: Mild periportal edema in the liver. No suspicious cystic or solid hepatic lesions. No intra or extrahepatic biliary ductal dilatation. Amorphous intermediate attenuation material lying dependently in the gallbladder, compatible with biliary sludge and/or noncalcified gallstones. Gallbladder is not distended. No pericholecystic fluid or surrounding inflammatory changes. Pancreas: No pancreatic mass. No pancreatic ductal dilatation. No pancreatic or peripancreatic fluid collections or inflammatory changes. Spleen: Unremarkable. Adrenals/Urinary Tract:  Severe atrophy of the kidneys bilaterally. Innumerable small subcentimeter low-attenuation lesions in both kidneys, too small to definitively characterize, but statistically likely to represent tiny cysts. In addition, in the upper pole of the right kidney (axial image 15 of series 12 and coronal image 88 of series 15) there is a partially exophytic partially calcified 2.5 x 1.7 x 2.1 cm lesion which demonstrates heterogeneous enhancement, concerning for possible neoplasm such as a renal cell carcinoma. No hydroureteronephrosis. Urinary bladder is nearly completely decompressed, but otherwise unremarkable in appearance. Bilateral adrenal glands are normal in appearance. Stomach/Bowel: The appearance of the stomach is unremarkable. There is no pathologic dilatation of small bowel or colon. Suture lines are evident in the region of the rectosigmoid junction and the cecum, presumably from prior partial colectomies. No high attenuation is noted in the lumen of the colon to suggest active extravasation on today's examination. Numerous colonic diverticula are noted, without surrounding inflammatory changes to indicate an acute diverticulitis at this time. Lymphatic: No lymphadenopathy noted in the abdomen or pelvis. Reproductive: Status post hysterectomy. Ovaries are not confidently identified may be surgically absent or atrophic. Other: No significant volume of ascites.  No pneumoperitoneum. Musculoskeletal: There are no aggressive appearing lytic or blastic lesions noted in the visualized portions of the skeleton. IMPRESSION: VASCULAR 1. No acute findings. 2. Extensive aortic atherosclerosis and atherosclerosis in the visceral arteries with moderate stenosis at the ostium of the celiac axis and superior mesenteric arteries, and severe chronic stenosis of the renal arteries bilaterally. 3. Coronary artery disease. NON-VASCULAR 1. No evidence of intraluminal extravasation of contrast within the colon. 2. Extensive colonic  diverticulosis, without evidence of acute diverticulitis at this time. 3. Partially calcified heterogeneously enhancing lesion extending off the medial aspect of the upper pole of the right kidney, concerning for potential renal cell carcinoma. Further evaluation with nonemergent outpatient abdominal MRI with and without IV gadolinium is recommended in the near future to provide definitive characterization. 4. Biliary sludge  and/or noncalcified gallstones lying dependently in the gallbladder. No imaging findings to suggest an acute cholecystitis at this time. 5. Severe bilateral renal atrophy. 6. Small bilateral pleural effusions. 7. Cardiomegaly. 8. Additional incidental findings, as above. Electronically Signed   By: Trudie Reed M.D.   On: 11/13/2022 07:44   IR Fluoro Guide CV Line Right  Result Date: 11/12/2022 INDICATION: Poor venous access. In need of durable intravenous access for contrast administration for impending CTA to evaluate for chronic mesenteric ischemia Patient with history of end-stage renal disease with multiple previous central venous accesses, currently receiving dialysis via left jugular approach dialysis catheter. Patient with history of contrast allergy, not presently premedicated. As such, the decision was made to proceed with placement of a non tunneled femoral approach central venous catheter given concern for difficulty/inability to place a right jugular approach central venous catheter given multiple previous hemodialysis catheters. EXAM: ULTRASOUND AND FLUOROSCOPIC GUIDED NON TUNNELED CENTRAL VENOUS CATHETER INSERTION MEDICATIONS: None. CONTRAST:  None FLUOROSCOPY TIME:  18 seconds (1 mGy) COMPLICATIONS: None immediate. TECHNIQUE: The procedure, risks, benefits, and alternatives were explained to the patient and informed written consent was obtained. A timeout was performed prior to the initiation of the procedure. The right groin was prepped with chlorhexidine in a sterile  fashion, and a sterile drape was applied covering the operative field. Maximum barrier sterile technique with sterile gowns and gloves were used for the procedure. A timeout was performed prior to the initiation of the procedure. Local anesthesia was provided with 1% lidocaine. Right femoral head was marked fluoroscopically. Under direct ultrasound guidance, the right common femoral vein vein was accessed with a micropuncture kit after the overlying soft tissues were anesthetized with 1% lidocaine. Real-time ultrasound guidance was utilized for vascular access including the acquisition of a permanent ultrasound image documenting patency of the accessed vessel. A guidewire was advanced to the level of the inferior caval-atrial junction for measurement purposes and the non tunneled central venous catheter was cut to length. A peel-away sheath was placed and a 5 [place], dual lumen was inserted to level of the inferior caval-atrial junction. A post procedure spot fluoroscopic was obtained. The catheter easily aspirated and flushed and was secured in place with an interrupted suture. A dressing was applied. The patient tolerated the procedure well without immediate post procedural complication. FINDINGS: After catheter placement, the tip lies within the inferior cavoatrial junction. The catheter aspirates and flushes normally and is ready for immediate use. IMPRESSION: Successful ultrasound and fluoroscopic guided placement of a right common femoral vein approach, 5 [place], dual lumen non tunneled central venous catheter with tip at the inferior caval-atrial junction. The central venous catheter is ready for immediate use. Electronically Signed   By: Simonne Come M.D.   On: 11/12/2022 14:03   IR US Guide Vasc Access Right  Result Date: 11/12/2022 INDICATION: Poor venous access. In need of durable intravenous access for contrast administration for impending CTA to evaluate for chronic mesenteric ischemia Patient with  history of end-stage renal disease with multiple previous central venous accesses, currently receiving dialysis via left jugular approach dialysis catheter. Patient with history of contrast allergy, not presently premedicated. As such, the decision was made to proceed with placement of a non tunneled femoral approach central venous catheter given concern for difficulty/inability to place a right jugular approach central venous catheter given multiple previous hemodialysis catheters. EXAM: ULTRASOUND AND FLUOROSCOPIC GUIDED NON TUNNELED CENTRAL VENOUS CATHETER INSERTION MEDICATIONS: None. CONTRAST:  None FLUOROSCOPY TIME:  18 seconds (  1 mGy) COMPLICATIONS: None immediate. TECHNIQUE: The procedure, risks, benefits, and alternatives were explained to the patient and informed written consent was obtained. A timeout was performed prior to the initiation of the procedure. The right groin was prepped with chlorhexidine in a sterile fashion, and a sterile drape was applied covering the operative field. Maximum barrier sterile technique with sterile gowns and gloves were used for the procedure. A timeout was performed prior to the initiation of the procedure. Local anesthesia was provided with 1% lidocaine. Right femoral head was marked fluoroscopically. Under direct ultrasound guidance, the right common femoral vein vein was accessed with a micropuncture kit after the overlying soft tissues were anesthetized with 1% lidocaine. Real-time ultrasound guidance was utilized for vascular access including the acquisition of a permanent ultrasound image documenting patency of the accessed vessel. A guidewire was advanced to the level of the inferior caval-atrial junction for measurement purposes and the non tunneled central venous catheter was cut to length. A peel-away sheath was placed and a 5 [place], dual lumen was inserted to level of the inferior caval-atrial junction. A post procedure spot fluoroscopic was obtained. The  catheter easily aspirated and flushed and was secured in place with an interrupted suture. A dressing was applied. The patient tolerated the procedure well without immediate post procedural complication. FINDINGS: After catheter placement, the tip lies within the inferior cavoatrial junction. The catheter aspirates and flushes normally and is ready for immediate use. IMPRESSION: Successful ultrasound and fluoroscopic guided placement of a right common femoral vein approach, 5 [place], dual lumen non tunneled central venous catheter with tip at the inferior caval-atrial junction. The central venous catheter is ready for immediate use. Electronically Signed   By: Simonne Come M.D.   On: 11/12/2022 14:03   [place] MESENTERIC ARTERIES  Result Date: 11/11/2022 CLINICAL DATA:  Abdominal pain EXAM: [place] MESENTERIC ARTERIAL DOPPLER COMPARISON:  CT abdomen pelvis without contrast 11/02/2022 FINDINGS: Celiac axis: 141 cm/sec Unable to perform inspiration and expiration evaluation due to patient condition. Splenic artery: 175 cm/sec Hepatic artery: 45 cm/sec SMA: 315 cm/sec IMA: Not visualized Aortic size: 1.6 cm proximally, 1.2 cm in the mid segment and 1.1 cm distally IMPRESSION: Elevated peak systolic velocity in the superior mesenteric artery is suspicious for significant stenosis. Electronically Signed   By: Acquanetta Belling M.D.   On: 11/11/2022 10:25   DG Abd 1 View  Result Date: 11/07/2022 CLINICAL DATA:  Abdominal pain/distention EXAM: ABDOMEN - 1 VIEW COMPARISON:  Abdominal radiograph dated April 27th 2020 FINDINGS: Nonobstructive bowel-gas pattern. Surgical clips seen in the pelvis. No acute osseous abnormality. IMPRESSION: Nonobstructive bowel-gas pattern. Electronically Signed   By: Allegra Lai M.D.   On: 11/07/2022 17:57   DG Abd Portable 2V  Result Date: 11/05/2022 CLINICAL DATA:  Abdominal pain EXAM: PORTABLE ABDOMEN - 2 VIEW COMPARISON:  CT 11/02/2022 FINDINGS: No dilated loops of large or small bowel  identified. Increasing stool burden identified within the colon. No air-fluid levels identified. Surgical clips noted within the pelvis. Left lung base opacity identified within the imaged portions of the lower chest. Patient has a left chest wall dialysis catheter with tips in the right atrium. Signs of previous CABG procedure. IMPRESSION: 1. Nonobstructive bowel gas pattern. 2. Increasing stool burden within the colon. 3. Left base opacity identified within the imaged portions of the chest. Correlate for any clinical signs/symptoms of pneumonia. Electronically Signed   By: Signa Kell M.D.   On: 11/05/2022 10:27   US Abdomen Limited RUQ (  LIVER/GB)  Result Date: 11/03/2022 CLINICAL DATA:  Right upper quadrant pain EXAM: ULTRASOUND ABDOMEN LIMITED RIGHT UPPER QUADRANT COMPARISON:  CT 11/02/2022.  Older exams as well. FINDINGS: Gallbladder: Gallbladder is mildly distended. No shadowing stones or adjacent fluid but there is gallbladder wall thickening. Common bile duct: Diameter: 4 mm Liver: No focal lesion identified. Within normal limits in parenchymal echogenicity. Portal vein is patent on color Doppler imaging with normal direction of blood flow towards the liver. Other: None. IMPRESSION: No shadowing stones. The gallbladder wall is thickened which is nonspecific. No adjacent fluid. No reported sonographic Murphy sign. Please correlate with clinical symptomatology. If there is further concern, HIDA scan could be considered as clinically appropriate. No biliary ductal dilatation Electronically Signed   By: Karen Kays M.D.   On: 11/03/2022 14:59   CT ABDOMEN PELVIS WO CONTRAST  Result Date: 11/02/2022 CLINICAL DATA:  Acute abdominal pain after endoscopy EXAM: CT ABDOMEN AND PELVIS WITHOUT CONTRAST TECHNIQUE: Multidetector CT imaging of the abdomen and pelvis was performed following the standard protocol without IV contrast. RADIATION DOSE REDUCTION: This exam was performed according to the departmental  dose-optimization program which includes automated exposure control, adjustment of the mA and/or kV according to patient size and/or use of iterative reconstruction technique. COMPARISON:  CT 04/06/2020 without contrast FINDINGS: Lower chest: Small bilateral pleural effusions identified with some adjacent opacities. Atelectasis is favored over infiltrate recommend follow-up. The heart is enlarged. Status post median sternotomy. Coronary artery calcifications are seen. Hepatobiliary: On this non IV contrast exam, the liver has some small calcifications, possibly related to old granulomatous disease. However the gallbladder is distended with some slight wall thickening and stranding. There is also some luminal high density. Sludge or stones are possible. If there is concern further of acute cholecystitis an ultrasound may be of some benefit as the next step in the workup. Pancreas: Moderate atrophy of the pancreas.  No obvious mass. Spleen: Spleen is nonenlarged. Adrenals/Urinary Tract: The adrenal glands are grossly preserved. There is severe atrophy of the kidneys with several vascular calcifications and tiny cystic areas. The more complex hyperdense area along the upper pole of the right kidney is stable. Previously measuring 2.8 x 2.0 cm and today 2.5 by 1.5 cm. Contracted urinary bladder. Stomach/Bowel: There is some fluid and debris in the stomach. Moderate diffuse scattered colonic stool. Left-sided colonic diverticulosis. Surgical changes along the rectosigmoid colon. No obstruction. Surgical changes as well along loops of bowel in the right lower quadrant. The appendix is not well seen. There are several loops of small bowel in the midabdomen which have air-fluid levels and are mildly dilated measuring up to 3.2 cm. No abrupt caliber change. Vascular/Lymphatic: Extensive vascular calcifications. Normal caliber aorta and IVC. No specific abnormal lymph node enlargement identified in the abdomen and pelvis.  Reproductive: Status post hysterectomy. No adnexal masses. Other: Anasarca. No obstruction, free air or free fluid. Mild areas of mesenteric stranding and presacral space stranding. Musculoskeletal: Osteopenia. Scattered degenerative changes. Schmorl's node changes along lumbar spine and thoracic spine. IMPRESSION: Once again severely atrophic kidneys with calcifications and cystic areas including complex area along the upper pole of the right kidney which is stable in size. Recommend continued surveillance. Developing small bilateral pleural effusion with adjacent lung opacities. Atelectasis versus infiltrate. Recommend follow-up. Increasing anasarca mesenteric stranding. There are some mildly dilated loops of small bowel with some air-fluid levels but no abrupt transition. Ileus versus mild partial obstruction is in the differential recommend close follow-up. Colonic diverticulosis. Distended  gallbladder with wall thickening and some stranding. In addition there is some high attenuation material in the gallbladder. Please correlate with any sludge or stones. Please correlate with any prior contrast administration. Overall recommend further workup with ultrasound to further delineate when able. Electronically Signed   By: Karen Kays M.D.   On: 11/02/2022 14:27   MR BRAIN WO CONTRAST  Result Date: 10/31/2022 CLINICAL DATA:  Altered mental status EXAM: MRI HEAD WITHOUT CONTRAST TECHNIQUE: Multiplanar, multiecho pulse sequences of the brain and surrounding structures were obtained without intravenous contrast. COMPARISON:  04/23/2022 MRI, correlation is also made with 10/30/2022 CT head FINDINGS: Brain: No restricted diffusion to suggest acute or subacute infarct. No acute hemorrhage, mass, or midline shift. No hydrocephalus. 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 Normal pituitary  and craniocervical junction. Remote lacunar infarcts in the bilateral basal ganglia and left cerebellum. Punctate foci of hemosiderin deposition in the right cerebellum, right campus, and right frontal and parietal lobes, possibly sequela of prior microhemorrhage. No superficial siderosis. Confluent T2 hyperintense signal in the periventricular white matter, likely the sequela of moderate chronic small vessel ischemic disease. Vascular: Normal arterial flow voids. Skull and upper cervical spine: Normal marrow signal. Sinuses/Orbits: Mucosal thickening in the left sphenoid sinus. No acute finding in the orbits. Other: Trace fluid in the mastoid air cells. IMPRESSION: 1. No acute intracranial process. No evidence of acute or subacute infarct. 2. 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 Electronically Signed   By: Wiliam Ke M.D.   On: 10/31/2022 19:31   EEG adult  Result Date: 10/31/2022 Charlsie Quest, MD     10/31/2022  2:57 PM Patient Name: Shawna [name] MRN: [id number] Epilepsy Attending: Charlsie Quest Referring Physician/Provider: Rejeana Brock, MD Date:10/31/2022 Duration: 22.39 mins Patient history: 82yo F getting eeg to evaluate for seizure Level of alertness: Awake, asleep AEDs during EEG study: LEV Technical aspects: This EEG study was done with scalp electrodes positioned according to the 10-20 International system of electrode placement. Electrical activity was reviewed with band pass filter of 1-70Hz , sensitivity of 7 uV/mm, display speed of 75mm/sec with a 60Hz  notched filter applied as appropriate. EEG data were recorded continuously and digitally stored.  Video monitoring was available and reviewed as appropriate. Description: The posterior dominant rhythm consists of 8-9 Hz activity of moderate voltage (25-35 uV) seen predominantly in posterior head regions, symmetric and reactive to eye opening and eye closing.  Sleep was characterized by vertex waves, sleep spindles (12 to 14 Hz), maximal frontocentral region.  EEG showed intermittent generalized 3 to 6 Hz theta-delta slowing. Hyperventilation and photic stimulation were not performed.   ABNORMALITY - Intermittent slow, generalized IMPRESSION: This study is suggestive of mild diffuse encephalopathy. No seizures or epileptiform discharges were seen throughout the recording. Charlsie Quest   DG Abd 1 View  Result Date: 10/31/2022 CLINICAL DATA:  Abdominal pain EXAM: ABDOMEN - 1 VIEW COMPARISON:  08/29/2022 FINDINGS: Bowel gas pattern is nonspecific. Gas and stool are noted in colon. Small amount of stool is seen in colon. There are surgical staples and clips in lower abdomen and pelvis. There is no identifiable electronic device in abdomen and pelvis in the current study. In the previous examination, an electronic device, capsule most noted  in region of rectum suggesting interval passage. Arterial calcifications are seen. IMPRESSION: Nonspecific bowel gas pattern. There is no evidence of any metallic electronic capsule in the abdomen and pelvis. Electronically Signed   By: Ernie Avena M.D.   On: 10/31/2022 08:40   CT HEAD CODE STROKE WO CONTRAST  Result Date: 10/30/2022 CLINICAL DATA:  Code stroke.  Seizure, altered mental status. EXAM: CT HEAD WITHOUT CONTRAST TECHNIQUE: Contiguous axial images were obtained from the base of the skull through the vertex without intravenous contrast. RADIATION DOSE REDUCTION: This exam was performed according to the departmental dose-optimization program which includes automated exposure control, adjustment of the mA and/or kV according to patient size and/or use of iterative reconstruction technique. COMPARISON:  CT head 08/26/2022. FINDINGS: Brain: There is no evidence of acute territorial infarct. There are low-density subdural collections overlying both cerebral hemispheres measuring up to 6 mm on the left and 3 mm on  the right without evidence of acute blood products, new since the prior head CT from 08/26/2022. There is no mass effect on the underlying brain parenchyma. Parenchymal volume loss with prominence of the ventricular system and extra-axial CSF spaces is stable. The ventricles are unchanged in size and configuration. A remote infarct in the left basal ganglia is unchanged. Additional confluent hypodensity in the supratentorial white matter likely reflects sequela of underlying chronic small-vessel ischemic change. The pituitary and suprasellar region are normal. There is no mass lesion. There is no midline shift. Vascular: There is calcification of the bilateral carotid siphons and vertebral arteries. Skull: Normal. Negative for fracture or focal lesion. Sinuses/Orbits: The paranasal sinuses are clear. The globes and orbits are unremarkable. Other: None. ASPECTS [facility] Stroke Program Early CT Score) - Ganglionic level infarction (caudate, lentiform nuclei, internal capsule, insula, M1-M3 cortex): 7 - Supraganglionic infarction (M4-M6 cortex): 3 Total score (0-10 with 10 being normal): 10 IMPRESSION: 1. No evidence of acute intracranial hemorrhage or acute territorial infarct. 2. Low-density subdural hygromas overlying both cerebral hemispheres measuring up to 6 mm on the left and 3 mm on the right, new since the ct head from 08/26/2022 but without evidence of acute blood within the collections. These results communicated via Amion at the time of interpretation on 10/30/2022 at 6:44 pm to provider [facility] , who verbally acknowledged these results. Electronically Signed   By: Lesia Hausen M.D.   On: 10/30/2022 18:46   DG Chest Port 1 View  Result Date: 10/28/2022 CLINICAL DATA:  Cough EXAM: PORTABLE CHEST 1 VIEW COMPARISON:  10/23/2022 FINDINGS: Prior CABG. Left dialysis catheter remains in place with the tip in the right atrium. Mild cardiomegaly. No overt edema. Bibasilar atelectasis. No effusions or  acute bony abnormality. IMPRESSION: Cardiomegaly, bibasilar atelectasis. Electronically Signed   By: Charlett Nose M.D.   On: 10/28/2022 22:15   CT Chest Wo Contrast  Result Date: 10/23/2022 CLINICAL DATA:  Pneumonia, complication suspected, xray done EXAM: CT CHEST WITHOUT CONTRAST TECHNIQUE: Multidetector CT imaging of the chest was performed following the standard protocol without IV contrast. RADIATION DOSE REDUCTION: This exam was performed according to the departmental dose-optimization program which includes automated exposure control, adjustment of the mA and/or kV according to patient size and/or use of iterative reconstruction technique. COMPARISON:  Radiograph earlier today. Additional prior radiographs reviewed. Most recent chest CT available 07/16/2021 FINDINGS: Cardiovascular: Left-sided dialysis catheter tip is in the atrial caval junction. Mild cardiomegaly. Post CABG with calcification of native coronary arteries. Decreased density of the blood pool typical of anemia.  Advanced aortic atherosclerosis with aortic tortuosity. No aneurysm or periaortic stranding. No pericardial effusion. Mediastinum/Nodes: 14 mm lower anterior paratracheal node, similar to 2023 exam. Scattered additional shoddy mediastinal lymph nodes are also unchanged. No progressive adenopathy. Patulous esophagus. Lungs/Pleura: Small bilateral pleural effusions, left slightly larger than right. Slight vascular congestion. Minimal fluid in the fissures. Mild patchy ground-glass opacity in the dependent left greater than right lower lobe. Moderate central bronchial thickening which may be congestive or bronchitic. no endobronchial debris. No pulmonary mass. Upper Abdomen: Sequela of chronic renal disease with renal atrophy and cysts. No specific imaging follow-up of these cysts is recommended. A peripherally calcified density in the right upper kidney is stable from 2023 exam and consistent with a complex cyst, also needing no  further follow-up. Left colonic diverticulosis. Musculoskeletal: Exaggerated thoracic kyphosis with degenerative change throughout the thoracic spine. Chronic mild L1 compression deformity. Slight increase in Schmorl's node inferior endplate of T11. Prior median sternotomy. There are no acute or suspicious osseous abnormalities. IMPRESSION: 1. Small bilateral pleural effusions, left slightly larger than right. Slight vascular congestion. 2. Mild patchy ground-glass opacity in the dependent left greater than right lower lobe may be atelectasis or pneumonia. 3. Moderate central bronchial thickening may be congestive or bronchitic. Aortic Atherosclerosis (ICD10-I70.0). Electronically Signed   By: Narda Rutherford M.D.   On: 10/23/2022 15:35   DG Chest Port 1 View  Result Date: 10/23/2022 CLINICAL DATA:  82 year old female with cough. Productive cough for 2 weeks. Some shortness of breath. EXAM: PORTABLE CHEST 1 VIEW COMPARISON:  Chest radiographs 10/02/2022 and earlier. FINDINGS: Portable AP upright view at 1107 hours. Prior CABG. Stable cardiomegaly and mediastinal contours. Calcified aortic atherosclerosis. Stable left chest dual lumen dialysis type catheter. Patchy, nonspecific increased left lung base opacity partially obscuring the diaphragm now. No superimposed pneumothorax or consolidation. Stable pulmonary vascularity without overt edema. No confluent right lung opacity. Stable visualized osseous structures.  Negative visible bowel gas. IMPRESSION: 1. Patchy, nonspecific increased left lung base opacity new since last month. PA and lateral views may be helpful if feasible. 2. Otherwise stable cardiomegaly, pulmonary vascularity, prior CABG, chest dialysis catheter, Aortic Atherosclerosis. Electronically Signed   By: Odessa Fleming M.D.   On: 10/23/2022 11:16    Micro Results    No results found for this or any previous visit (from the past 240 hour(s)).  Today   Subjective    Fidel Levy today has no  headache,no chest abdominal pain,no new weakness tingling or numbness, feels much better    Objective   Blood pressure (!) 154/55, pulse (!) 56, temperature 97.8 F (36.6 C), temperature source Oral, resp. rate 13, height 5\' 2"  (1.575 m), weight 59.4 kg, SpO2 99 %.   Intake/Output Summary (Last 24 hours) at 11/15/2022 1100 Last data filed at 11/14/2022 1200 Gross per 24 hour  Intake --  Output 2500 ml  Net -2500 ml    Exam  Awake, mildly and pleasantly confused, no new F.N deficits,    [place].AT,PERRAL Supple Neck,   Symmetrical Chest wall movement, Good air movement bilaterally, CTAB RRR,No Gallops,   +ve B.Sounds, Abd Soft, Non tender,  No Cyanosis, Clubbing or edema    Data Review   Recent Labs  Lab 11/09/22 0351 11/11/22 0407 11/13/22 0219 11/14/22 0840  WBC 9.1 7.8 13.0* 18.7*  HGB 10.3* 8.8* 9.2* 8.9*  HCT 30.6* 26.7* 27.5* 26.0*  PLT 220 229 271 297  MCV 106.3* 108.1* 105.4* 105.7*  MCH 35.8* 35.6* 35.2* 36.2*  MCHC 33.7 33.0 33.5 34.2  RDW 24.6* 25.2* 24.5* 25.0*    Recent Labs  Lab 11/09/22 1947 11/11/22 0407 11/13/22 0219 11/14/22 0330 11/14/22 0840  NA 129* 131* 128*  --  127*  K 4.1 4.0 5.2*  --  4.8  CL 90* 95* 94*  --  89*  CO2 23 23 22   --  22  ANIONGAP 16* 13 12  --  16*  GLUCOSE 198* 98 182*  --  134*  BUN 60* 47* 45*  --  76*  CREATININE 7.55* 5.56* 5.78*  --  7.76*  ALBUMIN 3.1*  --  2.9*  --  3.0*  MG  --  1.3* 1.6* 2.2  --   CALCIUM 8.4* 8.0* 8.6*  --  8.5*    Total Time in preparing paper work, data evaluation and todays exam - 35 minutes  Signature  -    Susa Raring M.D on 11/15/2022 at 11:00 AM   -  To page go to [email or web address]

## 2022-11-15 NOTE — Telephone Encounter (Signed)
Sw at Hospital to fill out forms for patient assistance for the patient while she is in patient. I have reached out to [employer] to see if they are able to help with administration.

## 2022-11-15 NOTE — Procedures (Addendum)
Interventional Radiology Procedure Note  PROCEDURE SUMMARY:  Successful removal of non-tunneled right common femoral central venous catheter   No complications.   EBL = trace  Please see full dictation in imaging section of Epic for procedure details.    Alex Gardener, AGNP-BC 11/15/2022, 1:23 PM

## 2022-11-15 NOTE — Care Management Important Message (Signed)
Important Message  Patient Details  Name: Shawna [name] MRN: [id number] Date of Birth: 12-09-1940   Medicare Important Message Given:  Yes     Anquinette Pierro 11/15/2022, 1:16 PM

## 2022-11-16 NOTE — Telephone Encounter (Signed)
Per Windy Fast with [employer]. The fax number is [phone]

## 2022-11-16 NOTE — Telephone Encounter (Signed)
Faxed patient records with demographics and insurance to [employer] Atten Windy Fast and Gerilyn Pilgrim.

## 2022-11-16 NOTE — Telephone Encounter (Signed)
Ann to pull patient records for me to submit to Windy Fast at [facility].

## 2022-11-18 ENCOUNTER — Other Ambulatory Visit: Payer: Self-pay

## 2022-11-18 ENCOUNTER — Encounter (HOSPITAL_COMMUNITY): Payer: Self-pay | Admitting: *Deleted

## 2022-11-18 ENCOUNTER — Other Ambulatory Visit (HOSPITAL_COMMUNITY): Payer: Self-pay

## 2022-11-18 ENCOUNTER — Emergency Department (HOSPITAL_COMMUNITY)
Admission: EM | Admit: 2022-11-18 | Discharge: 2022-11-18 | Disposition: A | Discharge status: Home / Self Care | Payer: Medicare Other | Attending: Emergency Medicine | Admitting: Emergency Medicine

## 2022-11-18 DIAGNOSIS — M79671 Pain in right foot: Secondary | ICD-10-CM | POA: Diagnosis not present

## 2022-11-18 DIAGNOSIS — M79672 Pain in left foot: Secondary | ICD-10-CM | POA: Insufficient documentation

## 2022-11-18 DIAGNOSIS — Z7982 Long term (current) use of aspirin: Secondary | ICD-10-CM | POA: Insufficient documentation

## 2022-11-18 LAB — COMPREHENSIVE METABOLIC PANEL
ALT: 9 U/L (ref 0–44)
AST: <5 U/L — ABNORMAL LOW (ref 15–41)
Albumin: 3.4 g/dL — ABNORMAL LOW (ref 3.5–5.0)
Alkaline Phosphatase: 67 U/L (ref 38–126)
Anion gap: 15 (ref 5–15)
BUN: 59 mg/dL — ABNORMAL HIGH (ref 8–23)
CO2: 20 mmol/L — ABNORMAL LOW (ref 22–32)
Calcium: 8.8 mg/dL — ABNORMAL LOW (ref 8.9–10.3)
Chloride: 95 mmol/L — ABNORMAL LOW (ref 98–111)
Creatinine, Ser: 8.18 mg/dL — ABNORMAL HIGH (ref 0.44–1.00)
GFR, Estimated: 5 mL/min — ABNORMAL LOW (ref >60)
Glucose, Bld: 122 mg/dL — ABNORMAL HIGH (ref 70–99)
Potassium: 3.8 mmol/L (ref 3.5–5.1)
Sodium: 130 mmol/L — ABNORMAL LOW (ref 135–145)
Total Bilirubin: 0.9 mg/dL (ref 0.3–1.2)
Total Protein: 6.7 g/dL (ref 6.5–8.1)

## 2022-11-18 LAB — CBC WITH DIFFERENTIAL/PLATELET
Abs Immature Granulocytes: 0.07 10*3/uL (ref 0.00–0.07)
Basophils Absolute: 0.1 10*3/uL (ref 0.0–0.1)
Basophils Relative: 1 %
Eosinophils Absolute: 0.1 10*3/uL (ref 0.0–0.5)
Eosinophils Relative: 1 %
HCT: 27.9 % — ABNORMAL LOW (ref 36.0–46.0)
Hemoglobin: 9.2 g/dL — ABNORMAL LOW (ref 12.0–15.0)
Immature Granulocytes: 1 %
Lymphocytes Relative: 4 %
Lymphs Abs: 0.5 10*3/uL — ABNORMAL LOW (ref 0.7–4.0)
MCH: 35.9 pg — ABNORMAL HIGH (ref 26.0–34.0)
MCHC: 33 g/dL (ref 30.0–36.0)
MCV: 109 fL — ABNORMAL HIGH (ref 80.0–100.0)
Monocytes Absolute: 1.6 10*3/uL — ABNORMAL HIGH (ref 0.1–1.0)
Monocytes Relative: 14 %
Neutro Abs: 9.3 10*3/uL — ABNORMAL HIGH (ref 1.7–7.7)
Neutrophils Relative %: 79 %
Platelets: 292 10*3/uL (ref 150–400)
RBC: 2.56 MIL/uL — ABNORMAL LOW (ref 3.87–5.11)
WBC: 11.7 10*3/uL — ABNORMAL HIGH (ref 4.0–10.5)
nRBC: 0 % (ref 0.0–0.2)

## 2022-11-18 LAB — URIC ACID: Uric Acid, Serum: 5.8 mg/dL (ref 2.5–7.1)

## 2022-11-18 MED ORDER — OXYCODONE-ACETAMINOPHEN 5-325 MG PO TABS: 1.0000 | ORAL_TABLET | Freq: Four times a day (QID) | ORAL | 0 refills | Status: DC | PRN

## 2022-11-18 MED ORDER — OXYCODONE-ACETAMINOPHEN 5-325 MG PO TABS
1.0000 | ORAL_TABLET | Freq: Once | ORAL | Status: AC
  Administered 2022-11-18: 1 via ORAL
  Filled 2022-11-18: qty 1

## 2022-11-18 MED ORDER — METHYLPREDNISOLONE SODIUM SUCC 125 MG IJ SOLR
125.0000 mg | Freq: Once | INTRAMUSCULAR | Status: AC
  Administered 2022-11-18: 125 mg via INTRAMUSCULAR
  Filled 2022-11-18: qty 2

## 2022-11-18 MED ORDER — PREDNISONE 10 MG PO TABS: 20.0000 mg | ORAL_TABLET | Freq: Every day | ORAL | 0 refills | Status: DC

## 2022-11-18 NOTE — ED Triage Notes (Signed)
Pt c/o bilateral feet pain and states today she is unable to walk on her feet  Pt had dialysis last on Wednesday. States she didn't feel like going to dialysis today

## 2022-11-18 NOTE — Discharge Instructions (Signed)
Rest at home over the weekend and that your family doctor check you Monday or Tuesday

## 2022-11-18 NOTE — ED Provider Notes (Signed)
[place] EMERGENCY DEPARTMENT AT [facility] Provider Note   CSN: [id number] Arrival date & time: 11/18/22  1256     History {Add pertinent medical, surgical, social history, OB history to HPI:1} Chief Complaint  Patient presents with   Foot Pain    Shawna [name] is a 82 y.o. female.  Patient complains of bilateral feet pain.  She is a dialysis patient   Foot Pain       Home Medications Prior to Admission medications   Medication Sig Start Date End Date Taking? Authorizing Provider  oxyCODONE-acetaminophen (PERCOCET/ROXICET) 5-325 MG tablet Take 1 tablet by mouth every 6 (six) hours as needed for severe pain. 11/18/22  Yes Bethann Berkshire, MD  oxyCODONE-acetaminophen (PERCOCET/ROXICET) 5-325 MG tablet Take 1 tablet by mouth every 6 (six) hours as needed for severe pain. 11/18/22  Yes Bethann Berkshire, MD  predniSONE (DELTASONE) 10 MG tablet Take 2 tablets (20 mg total) by mouth daily. 11/18/22  Yes Bethann Berkshire, MD  ALPRAZolam Prudy Feeler) 0.5 MG tablet Take 1 tablet (0.5 mg total) by mouth daily as needed for anxiety. 11/15/22   Leroy Sea, MD  amiodarone (PACERONE) 100 MG tablet Take 1 tablet (100 mg total) by mouth daily. 04/27/22   Rhetta Mura, MD  amoxicillin-clavulanate (AUGMENTIN) 500-125 MG tablet Take 1 tablet by mouth 2 (two) times daily. 11/15/22   Leroy Sea, MD  Aspirin 81 MG CAPS Take 1 capsule by mouth daily at 12 noon.    [provider]  atorvastatin (LIPITOR) 10 MG tablet Take 1 tablet by mouth at bedtime.    [provider]  benzonatate (TESSALON) 100 MG capsule Take 100 mg by mouth 3 (three) times daily as needed for cough. 05/10/22   [provider]  carvedilol (COREG) 3.125 MG tablet Take 1 tablet (3.125 mg total) by mouth 2 (two) times daily with a meal. 11/15/22   Leroy Sea, MD  cyclobenzaprine (FLEXERIL) 5 MG tablet Take 5 mg by mouth 3 (three) times daily as needed.    [provider]   folic acid (FOLVITE) 1 MG tablet Take 1 mg by mouth daily. 05/24/22   [provider]  gabapentin (NEURONTIN) 100 MG capsule TAKE 1 CAPSULE BY MOUTH IN THE EVENING    [provider]  guaiFENesin-codeine 100-10 MG/5ML syrup Take 5 mLs by mouth every 4 (four) hours as needed for cough. 10/23/22   Jacalyn Lefevre, MD  hydrALAZINE (APRESOLINE) 100 MG tablet Take 1 tablet (100 mg total) by mouth every 8 (eight) hours. 11/15/22   Leroy Sea, MD  hydrOXYzine (ATARAX) 25 MG tablet Take 25 mg by mouth every 6 (six) hours as needed for itching. 06/06/22   [provider]  levETIRAcetam (KEPPRA) 500 MG tablet Take 1 tablet (500 mg total) by mouth daily. 11/15/22   Leroy Sea, MD  levothyroxine (SYNTHROID) 75 MCG tablet Take 1 tablet (75 mcg total) by mouth daily before breakfast. 08/14/22 11/01/22  Hughie Closs, MD  lisinopril (ZESTRIL) 40 MG tablet Take 1 tablet (40 mg total) by mouth daily. 11/15/22   Leroy Sea, MD  multivitamin (RENA-VIT) TABS tablet Take 1 tablet by mouth daily.    [provider]  nitroGLYCERIN (NITROSTAT) 0.4 MG SL tablet Place 0.4 mg under the tongue every 5 (five) minutes as needed for chest pain.    [provider]  octreotide (SANDOSTATIN LAR) 20 MG injection Inject 20 mg into the muscle every 28 (twenty-eight) days. 11/15/22  Leroy Sea, MD  omeprazole (PRILOSEC) 40 MG capsule Take 1 capsule (40 mg total) by mouth daily. 10/06/22   Aida Raider, NP  ondansetron (ZOFRAN-ODT) 4 MG disintegrating tablet Take 4 mg by mouth every 8 (eight) hours as needed for nausea or vomiting. 05/05/22   [provider]  rosuvastatin (CRESTOR) 10 MG tablet Take 1 tablet by mouth once daily 06/06/22   [place] Ouch, [place] M, PA-C  sevelamer carbonate (RENVELA) 800 MG tablet Take 3,200 mg by mouth in the morning and at bedtime. 12/27/21   [provider]  traMADol (ULTRAM) 50 MG tablet Take 1 tablet (50 mg total) by mouth  every 12 (twelve) hours as needed for moderate pain. 11/15/22   Leroy Sea, MD      Allergies    Amlodipine, Aspirin, Nitrofurantoin, Ranexa [ranolazine], Bactrim [sulfamethoxazole-trimethoprim], Contrast media [iodinated contrast media], Iron, Gabapentin, Iron sucrose, Sucroferric oxyhydroxide, Levaquin [levofloxacin in d5w], Pantoprazole, Plavix [clopidogrel bisulfate], Protonix [pantoprazole sodium], and Venofer [ferric oxide]    Review of Systems   Review of Systems  Physical Exam Updated Vital Signs BP (!) 119/44 (BP Location: Right Arm)   Pulse (!) 50   Temp 98.1 F (36.7 C) (Oral)   Resp 18   Ht 5\' 2"  (1.575 m)   Wt 59.4 kg   SpO2 96%   BMI 23.95 kg/m  Physical Exam  ED Results / Procedures / Treatments   Labs (all labs ordered are listed, but only abnormal results are displayed) Labs Reviewed  CBC WITH DIFFERENTIAL/PLATELET - Abnormal; Notable for the following components:      Result Value   WBC 11.7 (*)    RBC 2.56 (*)    Hemoglobin 9.2 (*)    HCT 27.9 (*)    MCV 109.0 (*)    MCH 35.9 (*)    Neutro Abs 9.3 (*)    Lymphs Abs 0.5 (*)    Monocytes Absolute 1.6 (*)    All other components within normal limits  COMPREHENSIVE METABOLIC PANEL - Abnormal; Notable for the following components:   Sodium 130 (*)    Chloride 95 (*)    CO2 20 (*)    Glucose, Bld 122 (*)    BUN 59 (*)    Creatinine, Ser 8.18 (*)    Calcium 8.8 (*)    Albumin 3.4 (*)    AST <5 (*)    GFR, Estimated 5 (*)    All other components within normal limits  URIC ACID    EKG None  Radiology No results found.  Procedures Procedures  {Document cardiac monitor, telemetry assessment procedure when appropriate:1}  Medications Ordered in ED Medications  methylPREDNISolone sodium succinate (SOLU-MEDROL) 125 mg/2 mL injection 125 mg (has no administration in time range)  oxyCODONE-acetaminophen (PERCOCET/ROXICET) 5-325 MG per tablet 1 tablet (1 tablet Oral Given 11/18/22 1926)     ED Course/ Medical Decision Making/ A&P   {   Click here for ABCD2, HEART and other calculatorsREFRESH Note before signing :1}                          Medical Decision Making Amount and/or Complexity of Data Reviewed Labs: ordered.  Risk Prescription drug management.   Patient with moderate swelling and tenderness to both feet.  She is sent home with prednisone and Percocet  {Document critical care time when appropriate:1} {Document review of labs and clinical decision tools ie heart score, Chads2Vasc2 etc:1}  {Document your independent review  of radiology images, and any outside records:1} {Document your discussion with family members, caretakers, and with consultants:1} {Document social determinants of health affecting pt's care:1} {Document your decision making why or why not admission, treatments were needed:1} Final Clinical Impression(s) / ED Diagnoses Final diagnoses:  Foot pain, left    Rx / DC Orders ED Discharge Orders          Ordered    oxyCODONE-acetaminophen (PERCOCET/ROXICET) 5-325 MG tablet  Every 6 hours PRN        11/18/22 2229    predniSONE (DELTASONE) 10 MG tablet  Daily        11/18/22 2229    oxyCODONE-acetaminophen (PERCOCET/ROXICET) 5-325 MG tablet  Every 6 hours PRN        11/18/22 2229

## 2022-11-21 NOTE — Telephone Encounter (Signed)
Faxed [employer] the updated insurance card.

## 2022-11-22 MED FILL — Oxycodone w/ Acetaminophen Tab 5-325 MG: ORAL | Qty: 6 | Status: AC

## 2022-11-22 NOTE — Telephone Encounter (Signed)
Milon Dikes called from [facility], he says he has all he needs for now,but the patient insurance will not allow them to fill the generic Sandostatin/Octreotide until the end of the month as it is too early to fill according to insurance as the patient recently had it filled while in hospital.

## 2022-11-23 NOTE — Telephone Encounter (Signed)
Orders on Dr.Castaneda's desk for signature.

## 2022-11-23 NOTE — Telephone Encounter (Signed)
Faxed the forms to [facility] 11/23/2022.

## 2022-11-23 NOTE — Telephone Encounter (Signed)
Thanks, will sign papers today

## 2022-11-24 ENCOUNTER — Ambulatory Visit: Payer: Medicare Other | Admitting: Gastroenterology

## 2022-11-24 ENCOUNTER — Ambulatory Visit
Admission: RE | Admit: 2022-11-24 | Discharge: 2022-11-24 | Disposition: A | Discharge status: Home / Self Care | Payer: Medicare Other | Source: Ambulatory Visit | Attending: Student | Admitting: Student

## 2022-11-24 DIAGNOSIS — R1084 Generalized abdominal pain: Secondary | ICD-10-CM

## 2022-11-24 NOTE — Consult Note (Signed)
Chief Complaint: Abdominal pain  Referring Physician(s): Castaneda  History of Present Illness: Shawna [name] is a 82 y.o. female With complex past medical history significant for CAD, post coronary stent placement), chronic bronchitis, end-stage renal disease, on dialysis, GI bleeding due to AVMs, hypertension, hyperlipidemia, gout, ovarian cancer, colon cancer and TIA who was recently admitted to [facility] on 4/19 with weakness-Hb showed hemoglobin of 7.1. Patient was transfused PRBC-evaluated by GI-underwent small bowel enteroscopy which showed nonbleeding gastric ulcer, further hospital course complicated by seizures, and worsening abdominal pain with concern for chronic mesenteric ischemia. While at [facility]-patient loss IV access-and any IV access was unable to be obtained, hence patient was unable to get a CT angiogram of the abdomen. Ultimately, patient was transferred to [facility] on 5/3 for placement of IV access ( CT angiogram abdomen) and IR evaluation.   The patient was discharged on 11/15/2022 and is seen today in telemedicine consultation for posthospitalization evaluation.  The patient is accompanied on the phone call by her husband.  The patient states that since she has been discharged from the hospital she has not experienced any additional abdominal pain though does report persistent problems with diarrhea and loose stools.  She denies melena or hematochezia but does report pain associated with excessive wiping.  Patient denies change in appetite or energy level.  No "fear of food."  Patient denies any recent changes in her weight.   Past Medical History:  Diagnosis Date   Acute on chronic respiratory failure with hypoxia (HCC) 10/10/2016   Anxiety    Arthritis    AVM (arteriovenous malformation) of colon    CAD (coronary artery disease)    a. s/p CABG in 2013 b. DES to D1 in 10/2016. c. cath in 07/2018 showing patent grafts with occlusion of D1 at prior stent site and progression of  PDA disease --> medical management recommended   Carotid artery disease (HCC)    a. 60-79% LICA, 03/2012    Chronic bronchitis (HCC)    Chronic HFrEF (heart failure with reduced ejection fraction) (HCC)    Colon cancer (HCC) 1992   Esophageal stricture    ESRD on hemodialysis (HCC)    ESRD due to HTN, started dialysis 2011 and gets HD at [facility] with Dr Fausto Skillern on MWF schedule.  Access is LUA AVF as of Sept 2014.    GERD (gastroesophageal reflux disease)    High cholesterol 12/2011   History of blood transfusion 07/2011; 12/2011; 01/2012 X 2; 04/2012   History of gout    History of lower GI bleeding    Hypertension    Iron deficiency anemia    Jugular vein occlusion, right (HCC)    Mitral regurgitation    a. Moderate by echo, 02/2012   Mitral valve disease    NSVT (nonsustained ventricular tachycardia) (HCC)    Ovarian cancer (HCC) 1992   PAF (paroxysmal atrial fibrillation) (HCC)    Pneumonia ~ 2009   PUD (peptic ulcer disease)    TIA (transient ischemic attack)    Tricuspid valve disease     Past Surgical History:  Procedure Laterality Date   A/V FISTULAGRAM Left 10/04/2022   Procedure: A/V Fistulagram;  Surgeon: Renford Dills, MD;  Location: [facility] INVASIVE CV LAB;  Service: Cardiovascular;  Laterality: Left;   A/V SHUNTOGRAM Left 03/19/2019   Procedure: A/V SHUNTOGRAM;  Surgeon: Renford Dills, MD;  Location: [facility] INVASIVE CV LAB;  Service: Cardiovascular;  Laterality: Left;   ABDOMINAL HYSTERECTOMY  1992  APPENDECTOMY  06/1990   AV FISTULA PLACEMENT  07/2009   left upper arm   AV FISTULA PLACEMENT Right 09/06/2016   Procedure: RIGHT FOREARM ARTERIOVENOUS (AV) GRAFT;  Surgeon: Sherren Kerns, MD;  Location: [facility] OR;  Service: Vascular;  Laterality: Right;   AV FISTULA PLACEMENT N/A 02/24/2017   Procedure: INSERTION OF ARTERIOVENOUS (AV) GORE-TEX GRAFT ARM (BRACHIAL AXILLARY);  Surgeon: Renford Dills, MD;  Location: [facility] ORS;  Service: Vascular;  Laterality: N/A;    AVGG REMOVAL Right 09/06/2016   Procedure: REMOVAL OF Right Arm ARTERIOVENOUS GORETEX GRAFT and Vein Patch angioplasty of brachial artery;  Surgeon: Chuck Hint, MD;  Location: [facility] OR;  Service: Vascular;  Laterality: Right;   BIOPSY  09/26/2019   Procedure: BIOPSY;  Surgeon: Malissa Hippo, MD;  [facility];  Service: Endoscopy;;   BIOPSY  11/01/2022   Procedure: BIOPSY;  Surgeon: Dolores Frame, MD;  [facility];  Service: Gastroenterology;;   COLON RESECTION  1992   COLON SURGERY     COLONOSCOPY N/A 03/09/2019   Procedure: COLONOSCOPY;  Surgeon: Malissa Hippo, MD;  [facility];  Service: Endoscopy;  Laterality: N/A;   COLONOSCOPY N/A 08/11/2022   Procedure: COLONOSCOPY;  Surgeon: Beverley Fiedler, MD;  Location: [facility] ENDOSCOPY;  Service: Gastroenterology;  Laterality: N/A;   COLONOSCOPY WITH PROPOFOL N/A 06/08/2021   Procedure: COLONOSCOPY WITH PROPOFOL;  Surgeon: Dolores Frame, MD;  [facility];  Service: Gastroenterology;  Laterality: N/A;  9:05 /Patient is on dialysis Mon Wed Fri   CORONARY ANGIOPLASTY WITH STENT PLACEMENT  12/15/11   "2"   CORONARY ANGIOPLASTY WITH STENT PLACEMENT  y/2013   "1; makes total of 3" (05/02/2012)   CORONARY ARTERY BYPASS GRAFT  06/13/2012   Procedure: CORONARY ARTERY BYPASS GRAFTING (CABG);  Surgeon: Delight Ovens, MD;  Location: [facility] OR;  Service: Open Heart Surgery;  Laterality: N/A;  cabg x four;  using left internal mammary artery, and left leg greater saphenous vein harvested endoscopically   CORONARY STENT INTERVENTION N/A 10/13/2016   Procedure: Coronary Stent Intervention;  Surgeon: Lennette Bihari, MD;  [facility] INV[facility] CV LAB;  Service: Cardiovascular;  Laterality: N/A;   DIALYSIS/PERMA CATHETER REMOVAL N/A 04/18/2017   Procedure: DIALYSIS/PERMA CATHETER REMOVAL;  Surgeon: Renford Dills, MD;  Location: [facility] INVASIVE CV LAB;  Service: Cardiovascular;  Laterality: N/A;    DILATION AND CURETTAGE OF UTERUS     ENTEROSCOPY N/A 06/08/2021   Procedure: PUSH ENTEROSCOPY;  Surgeon: Dolores Frame, MD;  [facility];  Service: Gastroenterology;  Laterality: N/A;   ENTEROSCOPY N/A 11/01/2022   Procedure: ENTEROSCOPY;  Surgeon: Dolores Frame, MD;  [facility];  Service: Gastroenterology;  Laterality: N/A;   ESOPHAGOGASTRODUODENOSCOPY  01/20/2012   Procedure: ESOPHAGOGASTRODUODENOSCOPY (EGD);  Surgeon: Meryl Dare, MD,FACG;  Location: [facility] ENDOSCOPY;  Service: Endoscopy;  Laterality: N/A;   ESOPHAGOGASTRODUODENOSCOPY N/A 03/26/2013   Procedure: ESOPHAGOGASTRODUODENOSCOPY (EGD);  Surgeon: Hilarie Fredrickson, MD;  Location: [facility] ENDOSCOPY;  Service: Endoscopy;  Laterality: N/A;   ESOPHAGOGASTRODUODENOSCOPY N/A 04/30/2015   Procedure: ESOPHAGOGASTRODUODENOSCOPY (EGD);  Surgeon: Malissa Hippo, MD;  [facility];  Service: Endoscopy;  Laterality: N/A;  1pm - moved to 10/20 @ 1:10   ESOPHAGOGASTRODUODENOSCOPY N/A 07/29/2016   Procedure: ESOPHAGOGASTRODUODENOSCOPY (EGD);  Surgeon: Ruffin Frederick, MD;  Location: [facility] ENDOSCOPY;  Service: Gastroenterology;  Laterality: N/A;  enteroscopy   ESOPHAGOGASTRODUODENOSCOPY N/A 09/26/2019   Procedure: ESOPHAGOGASTRODUODENOSCOPY (EGD);  Surgeon: Malissa Hippo, MD;  [facility];  Service: Endoscopy;  Laterality: N/A;  1250   ESOPHAGOGASTRODUODENOSCOPY N/A 08/11/2022   Procedure: ESOPHAGOGASTRODUODENOSCOPY (EGD);  Surgeon: Beverley Fiedler, MD;  Location: [facility] ENDOSCOPY;  Service: Gastroenterology;  Laterality: N/A;   ESOPHAGOGASTRODUODENOSCOPY (EGD) WITH PROPOFOL N/A 02/05/2021   Procedure: ESOPHAGOGASTRODUODENOSCOPY (EGD) WITH PROPOFOL;  Surgeon: Lanelle Bal, DO;  [facility];  Service: Endoscopy;  Laterality: N/A;   ESOPHAGOGASTRODUODENOSCOPY (EGD) WITH PROPOFOL N/A 08/27/2022   Procedure: ESOPHAGOGASTRODUODENOSCOPY (EGD) WITH PROPOFOL;  Surgeon: Lanelle Bal, DO;  [facility];  Service: Endoscopy;  Laterality: N/A;   GIVENS CAPSULE STUDY N/A 03/07/2019   Procedure: GIVENS CAPSULE STUDY;  Surgeon: Malissa Hippo, MD;  [facility];  Service: Endoscopy;  Laterality: N/A;  7:30   GIVENS CAPSULE STUDY N/A 04/22/2021   Procedure: GIVENS CAPSULE STUDY;  Surgeon: Malissa Hippo, MD;  [facility];  Service: Endoscopy;  Laterality: N/A;  7:30   GIVENS CAPSULE STUDY N/A 08/27/2022   Procedure: GIVENS CAPSULE STUDY;  Surgeon: Lanelle Bal, DO;  [facility];  Service: Endoscopy;  Laterality: N/A;   HOT HEMOSTASIS N/A 08/11/2022   Procedure: HOT HEMOSTASIS (ARGON PLASMA COAGULATION/BICAP);  Surgeon: Beverley Fiedler, MD;  Location: [facility] ENDOSCOPY;  Service: Gastroenterology;  Laterality: N/A;   INSERTION OF DIALYSIS CATHETER N/A 10/05/2020   Procedure: ABORTED TUNNELED DIALYSIS CATHETER PLACEMENT RIGHT INTERNAL JUGULAR VEIN ;  Surgeon: Lucretia Roers, MD;  Location: [facility] ORS;  Service: General;  Laterality: N/A;   INTRAOPERATIVE TRANSESOPHAGEAL ECHOCARDIOGRAM  06/13/2012   Procedure: INTRAOPERATIVE TRANSESOPHAGEAL ECHOCARDIOGRAM;  Surgeon: Delight Ovens, MD;  Location: [facility] OR;  Service: Open Heart Surgery;  Laterality: N/A;   IR DIALY SHUNT INTRO NEEDLE/INTRACATH INITIAL W/IMG LEFT Left 10/06/2020   IR FLUORO GUIDE CV LINE RIGHT  06/17/2020   IR FLUORO GUIDE CV LINE RIGHT  11/12/2022   IR GENERIC HISTORICAL  07/26/2016   IR FLUORO GUIDE CV LINE RIGHT 07/26/2016 Berdine Dance, MD [facility]   IR GENERIC HISTORICAL  07/26/2016   IR US GUIDE VASC ACCESS RIGHT 07/26/2016 Berdine Dance, MD [facility]   IR GENERIC HISTORICAL  08/02/2016   IR US GUIDE VASC ACCESS RIGHT 08/02/2016 Berdine Dance, MD [facility]   IR GENERIC HISTORICAL  08/02/2016   IR FLUORO GUIDE CV LINE RIGHT 08/02/2016 Berdine Dance, MD [facility]   IR RADIOLOGY PERIPHERAL GUIDED IV START  03/28/2017   IR REMOVAL TUN CV CATH W/O FL  08/11/2020   IR  REMOVAL TUN CV CATH W/O FL  11/15/2022   IR THROMBECTOMY AV FISTULA W/THROMBOLYSIS INC/SHUNT/IMG LEFT Left 06/17/2020   IR US GUIDE VASC ACCESS LEFT  06/17/2020   IR US GUIDE VASC ACCESS RIGHT  03/28/2017   IR US GUIDE VASC ACCESS RIGHT  06/17/2020   IR US GUIDE VASC ACCESS RIGHT  11/12/2022   LEFT HEART CATH AND CORONARY ANGIOGRAPHY N/A 09/20/2016   Procedure: Left Heart Cath and Coronary Angiography;  Surgeon: Lyn Records, MD;  Location: [facility] INVASIVE CV LAB;  Service: Cardiovascular;  Laterality: N/A;   LEFT HEART CATH AND CORS/GRAFTS ANGIOGRAPHY N/A 10/13/2016   Procedure: Left Heart Cath and Cors/Grafts Angiography;  Surgeon: Lennette Bihari, MD;  [facility];  Service: Cardiovascular;  Laterality: N/A;   LEFT HEART CATH AND CORS/GRAFTS ANGIOGRAPHY N/A 07/13/2018   Procedure: LEFT HEART CATH AND CORS/GRAFTS ANGIOGRAPHY;  Surgeon: Swaziland, Peter M, MD;  [facility]: [facility] CV LAB;  Service: Cardiovascular;  Laterality: N/A;   LEFT HEART CATH AND CORS/GRAFTS ANGIOGRAPHY N/A 07/22/2021   Procedure: LEFT HEART CATH AND CORS/GRAFTS ANGIOGRAPHY;  Surgeon: Runell Gess, MD;  [facility];  Service: Cardiovascular;  Laterality: N/A;   LEFT HEART CATHETERIZATION WITH CORONARY ANGIOGRAM N/A 12/15/2011   Procedure: LEFT HEART CATHETERIZATION WITH CORONARY ANGIOGRAM;  Surgeon: Kathleene Hazel, MD;  Location: [facility] CATH LAB;  Service: Cardiovascular;  Laterality: N/A;   LEFT HEART CATHETERIZATION WITH CORONARY ANGIOGRAM N/A 01/10/2012   Procedure: LEFT HEART CATHETERIZATION WITH CORONARY ANGIOGRAM;  Surgeon: Peter M Swaziland, MD;  Location: [facility] CATH LAB;  Service: Cardiovascular;  Laterality: N/A;   LEFT HEART CATHETERIZATION WITH CORONARY ANGIOGRAM N/A 06/08/2012   Procedure: LEFT HEART CATHETERIZATION WITH CORONARY ANGIOGRAM;  Surgeon: Kathleene Hazel, MD;  Location: [facility] CATH LAB;  Service: Cardiovascular;  Laterality: N/A;   LEFT HEART CATHETERIZATION WITH CORONARY/GRAFT  ANGIOGRAM N/A 12/10/2013   Procedure: LEFT HEART CATHETERIZATION WITH Isabel Caprice;  Surgeon: Corky Crafts, MD;  Location: [facility] CATH LAB;  Service: Cardiovascular;  Laterality: N/A;   OVARY SURGERY     ovarian cancer   POLYPECTOMY  03/09/2019   Procedure: POLYPECTOMY;  Surgeon: Malissa Hippo, MD;  [facility];  Service: Endoscopy;;  cecal    POLYPECTOMY N/A 09/26/2019   Procedure: DUODENAL POLYPECTOMY;  Surgeon: Malissa Hippo, MD;  [facility];  Service: Endoscopy;  Laterality: N/A;   POLYPECTOMY  08/11/2022   Procedure: POLYPECTOMY;  Surgeon: Beverley Fiedler, MD;  Location: [facility] ENDOSCOPY;  Service: Gastroenterology;;   REVISION OF ARTERIOVENOUS GORETEX GRAFT N/A 02/24/2017   Procedure: REVISION OF ARTERIOVENOUS GORETEX GRAFT (RESECTION);  Surgeon: Renford Dills, MD;  Location: [facility] ORS;  Service: Vascular;  Laterality: N/A;   REVISON OF ARTERIOVENOUS FISTULA Left 06/19/2020   Procedure: REVISION OF LEFT UPPER ARM AV GRAFT WITH INTERPOSITION JUMP GRAFT USING GORE LIMB;  Surgeon: Cephus Shelling, MD;  Location: [facility] OR;  Service: Vascular;  Laterality: Left;   SHUNTOGRAM N/A 10/15/2013   Procedure: Fistulogram;  Surgeon: Nada Libman, MD;  Location: [facility] CATH LAB;  Service: Cardiovascular;  Laterality: N/A;   SUBMUCOSAL TATTOO INJECTION  11/01/2022   Procedure: SUBMUCOSAL TATTOO INJECTION;  Surgeon: Dolores Frame, MD;  [facility];  Service: Gastroenterology;;   THROMBECTOMY / ARTERIOVENOUS GRAFT REVISION  2011   left upper arm   TUBAL LIGATION  1980's   UPPER EXTREMITY ANGIOGRAPHY Bilateral 12/06/2016   Procedure: Upper Extremity Angiography;  Surgeon: Renford Dills, MD;  Location: [facility] INVASIVE CV LAB;  Service: Cardiovascular;  Laterality: Bilateral;   UPPER EXTREMITY INTERVENTION Left 06/06/2017   Procedure: UPPER EXTREMITY INTERVENTION;  Surgeon: Renford Dills, MD;  Location: [facility] INVASIVE CV LAB;  Service:  Cardiovascular;  Laterality: Left;   UPPER EXTREMITY VENOGRAPHY Left 10/04/2022   Procedure: UPPER EXTREMITY VENOGRAPHY;  Surgeon: Renford Dills, MD;  Location: [facility] INVASIVE CV LAB;  Service: Cardiovascular;  Laterality: Left;    Allergies: Amlodipine, Aspirin, Nitrofurantoin, Ranexa [ranolazine], Bactrim [sulfamethoxazole-trimethoprim], Contrast media [iodinated contrast media], Iron, Gabapentin, Iron sucrose, Sucroferric oxyhydroxide, Levaquin [levofloxacin in d5w], Pantoprazole, Plavix [clopidogrel bisulfate], Protonix [pantoprazole sodium], and Venofer [ferric oxide]  Medications: Prior to Admission medications   Medication Sig Start Date End Date Taking? Authorizing Provider  ALPRAZolam Prudy Feeler) 0.5 MG tablet Take 1 tablet (0.5 mg total) by mouth daily as needed for anxiety. 11/15/22   Leroy Sea,  MD  amiodarone (PACERONE) 100 MG tablet Take 1 tablet (100 mg total) by mouth daily. 04/27/22   Rhetta Mura, MD  amoxicillin-clavulanate (AUGMENTIN) 500-125 MG tablet Take 1 tablet by mouth 2 (two) times daily. 11/15/22   Leroy Sea, MD  Aspirin 81 MG CAPS Take 1 capsule by mouth daily at 12 noon.    [provider]  atorvastatin (LIPITOR) 10 MG tablet Take 1 tablet by mouth at bedtime.    [provider]  benzonatate (TESSALON) 100 MG capsule Take 100 mg by mouth 3 (three) times daily as needed for cough. 05/10/22   [provider]  carvedilol (COREG) 3.125 MG tablet Take 1 tablet (3.125 mg total) by mouth 2 (two) times daily with a meal. 11/15/22   Leroy Sea, MD  cyclobenzaprine (FLEXERIL) 5 MG tablet Take 5 mg by mouth 3 (three) times daily as needed.    [provider]  folic acid (FOLVITE) 1 MG tablet Take 1 mg by mouth daily. 05/24/22   [provider]  gabapentin (NEURONTIN) 100 MG capsule TAKE 1 CAPSULE BY MOUTH IN THE EVENING    [provider]  guaiFENesin-codeine 100-10 MG/5ML syrup Take 5 mLs by mouth  every 4 (four) hours as needed for cough. 10/23/22   Jacalyn Lefevre, MD  hydrALAZINE (APRESOLINE) 100 MG tablet Take 1 tablet (100 mg total) by mouth every 8 (eight) hours. 11/15/22   Leroy Sea, MD  hydrOXYzine (ATARAX) 25 MG tablet Take 25 mg by mouth every 6 (six) hours as needed for itching. 06/06/22   [provider]  levETIRAcetam (KEPPRA) 500 MG tablet Take 1 tablet (500 mg total) by mouth daily. 11/15/22   Leroy Sea, MD  levothyroxine (SYNTHROID) 75 MCG tablet Take 1 tablet (75 mcg total) by mouth daily before breakfast. 08/14/22 11/01/22  Hughie Closs, MD  lisinopril (ZESTRIL) 40 MG tablet Take 1 tablet (40 mg total) by mouth daily. 11/15/22   Leroy Sea, MD  multivitamin (RENA-VIT) TABS tablet Take 1 tablet by mouth daily.    [provider]  nitroGLYCERIN (NITROSTAT) 0.4 MG SL tablet Place 0.4 mg under the tongue every 5 (five) minutes as needed for chest pain.    [provider]  octreotide (SANDOSTATIN LAR) 20 MG injection Inject 20 mg into the muscle every 28 (twenty-eight) days. 11/15/22   Leroy Sea, MD  omeprazole (PRILOSEC) 40 MG capsule Take 1 capsule (40 mg total) by mouth daily. 10/06/22   Aida Raider, NP  ondansetron (ZOFRAN-ODT) 4 MG disintegrating tablet Take 4 mg by mouth every 8 (eight) hours as needed for nausea or vomiting. 05/05/22   [provider]  oxyCODONE-acetaminophen (PERCOCET/ROXICET) 5-325 MG tablet Take 1 tablet by mouth every 6 (six) hours as needed for severe pain. 11/18/22   Bethann Berkshire, MD  oxyCODONE-acetaminophen (PERCOCET/ROXICET) 5-325 MG tablet Take 1 tablet by mouth every 6 (six) hours as needed for severe pain. 11/18/22   Bethann Berkshire, MD  predniSONE (DELTASONE) 10 MG tablet Take 2 tablets (20 mg total) by mouth daily. 11/18/22   Bethann Berkshire, MD  rosuvastatin (CRESTOR) 10 MG tablet Take 1 tablet by mouth once daily 06/06/22   Iran Ouch, [place] M, PA-C  sevelamer carbonate (RENVELA) 800 MG  tablet Take 3,200 mg by mouth in the morning and at bedtime. 12/27/21   [provider]  traMADol (ULTRAM) 50 MG tablet Take 1 tablet (50 mg total) by mouth every 12 (twelve) hours as needed for moderate pain.  11/15/22   Leroy Sea, MD     Family History  Problem Relation Age of Onset   Heart disease Mother        Heart Disease before age 26   Hyperlipidemia Mother    Hypertension Mother    Diabetes Mother    Heart attack Mother    Heart disease Father        Heart Disease before age 48   Hyperlipidemia Father    Hypertension Father    Diabetes Father    Diabetes Sister    Hypertension Sister    Diabetes Brother    Hyperlipidemia Brother    Heart attack Brother    Hypertension Sister    Heart attack Brother    Colon cancer Child 26   Other Other        noncontributory for early CAD   Esophageal cancer Neg Hx    Liver disease Neg Hx    Kidney disease Neg Hx    Colon polyps Neg Hx     Social History   Socioeconomic History   Marital status: Married    Spouse name: Kjersti Diebold   Number of children: 1   Years of education: Not on file   Highest education level: GED or equivalent  Occupational History   Occupation: retired- Architectural technologist- sewed  Tobacco Use   Smoking status: Never   Smokeless tobacco: Never  Vaping Use   Vaping Use: Never used  Substance and Sexual Activity   Alcohol use: No    Alcohol/week: 0.0 standard drinks of alcohol   Drug use: No   Sexual activity: Not Currently    Birth control/protection: Surgical  Other Topics Concern   Not on file  Social History Narrative   Lives in [place], [place] with husband.  Dialysis pt - [facility].   Social Determinants of Health   Financial Resource Strain: Low Risk  (12/14/2018)   Overall Financial Resource Strain (CARDIA)    Difficulty of Paying Living Expenses: Not very hard  Food Insecurity: No Food Insecurity (10/29/2022)   Hunger Vital Sign    Worried About Running Out of Food in the  Last Year: Never true    Ran Out of Food in the Last Year: Never true  Transportation Needs: No Transportation Needs (10/29/2022)   PRAPARE - Administrator, Civil Service (Medical): No    Lack of Transportation (Non-Medical): No  Physical Activity: Unknown (12/14/2018)   Exercise Vital Sign    Days of Exercise per Week: Patient declined    Minutes of Exercise per Session: Patient declined  Stress: No Stress Concern Present (12/14/2018)   [employer] of Occupational Health - Occupational Stress Questionnaire    Feeling of Stress : Only a little  Social Connections: Unknown (12/14/2018)   Social Connection and Isolation Panel [NHANES]    Frequency of Communication with Friends and Family: Patient declined    Frequency of Social Gatherings with Friends and Family: Patient declined    Attends Religious Services: Patient declined    Database administrator or Organizations: Patient declined    Attends Engineer, structural: Patient declined    Marital Status: Patient declined    ECOG Status: 2 - Symptomatic, <50% confined to bed  Review of Systems  Review of Systems: A 12 point ROS discussed and pertinent positives are indicated in the HPI above.  All other systems are negative.  Physical Exam No direct physical exam was performed (except for noted visual  exam findings with Video Visits).   Vital Signs: There were no vitals taken for this visit.  Imaging:  Mesenteric ultrasound - 11/11/2022 - Borderline elevated peak systolic velocities are demonstrated within the proximal aspect of the SMA (representative images 37 through 52).  No evidence of a hemodynamically significant narrowing affecting the celiac artery.  The IMA was not imaged.  CTA abdomen and pelvis - 11/13/2022 - There is a minimal amount of eccentric noncalcified atherosclerotic plaque involving the origin of the celiac artery, not felt to result in a hemodynamically significant narrowing.  There is a  moderate amount of mixed calcified and noncalcified atherosclerotic plaque involving the origin of the SMA, not a failure resulting in hemodynamically significant narrowing (best appreciated on sagittal image 106, series 9).  The IMA appears patent.  There is a minimal amount of calcified atherosclerotic plaque involving in the bilateral normal caliber common iliac arteries, not resulting in a hemodynamically significant stenosis.  IR Removal Tun Cv Cath W/O FL  Result Date: 11/15/2022 INDICATION: IR placed non tunneled right common femoral central venous catheter due to poor venous access 11/12/2022. Request received to remove non tunneled right femoral catheter for discharge today. EXAM: REMOVAL NON TUNNELED RIGHT COMMON FEMORAL CENTRAL VENOUS CATHETER MEDICATIONS: None COMPLICATIONS: None immediate. PROCEDURE: Verbal consent was obtained from the patient following an explanation of the procedure, risks, benefits and alternatives to treatment. A time out was performed prior to the initiation of the procedure. Aseptic technique was utilized including mask, gloves and hand hygiene. Single suture was removed. The catheter was removed intact. Hemostasis was obtained with manual compression. A dressing was placed. The patient tolerated the procedure well without immediate post procedural complication. IMPRESSION: Successful removal non tunneled right common femoral central venous catheter. Read by: Alex Gardener, AGNP-BC Electronically Signed   By: Acquanetta Belling M.D.   On: 11/15/2022 14:03   DG Chest Port 1 View  Result Date: 11/15/2022 CLINICAL DATA:  Shortness of breath.  History of anemia. EXAM: PORTABLE CHEST 1 VIEW COMPARISON:  10/28/2022 FINDINGS: Status post median sternotomy and CABG procedure. Left sided dialysis catheter noted with tips in the right atrium. Stable mild cardiac enlargement. No pleural effusion, interstitial edema or airspace disease. No acute bone abnormality noted. IMPRESSION: No acute  cardiopulmonary abnormalities. Electronically Signed   By: Signa Kell M.D.   On: 11/15/2022 06:53   CT ANGIO GI BLEED  Result Date: 11/13/2022 CLINICAL DATA:  82 year old female with history of possible mesenteric ischemia. EXAM: CTA ABDOMEN AND PELVIS WITHOUT AND WITH CONTRAST TECHNIQUE: Multidetector CT imaging of the abdomen and pelvis was performed using the standard protocol during bolus administration of intravenous contrast. Multiplanar reconstructed images and MIPs were obtained and reviewed to evaluate the vascular anatomy. RADIATION DOSE REDUCTION: This exam was performed according to the departmental dose-optimization program which includes automated exposure control, adjustment of the mA and/or kV according to patient size and/or use of iterative reconstruction technique. CONTRAST:  80mL OMNIPAQUE IOHEXOL 350 MG/ML SOLN COMPARISON:  CT of the abdomen and pelvis 11/01/2012. FINDINGS: VASCULAR Aorta: Extensive aortic atherosclerosis. Normal caliber aorta without aneurysm, dissection, vasculitis or significant stenosis. Celiac: Atherosclerosis resulting in moderate stenosis at the ostium. Otherwise patent without evidence of aneurysm, dissection, vasculitis or other significant stenosis. SMA: Atherosclerosis resulting in moderate stenosis at the ostium. Otherwise patent without evidence of aneurysm, dissection, vasculitis or significant stenosis. Renals: Both renal arteries are patent but small caliber, with extensive atherosclerosis at the ostium of both vessels. IMA: Patent without  evidence of aneurysm, dissection, vasculitis or significant stenosis. Inflow: Patent without evidence of aneurysm, dissection, vasculitis or significant stenosis. Proximal Outflow: Bilateral common femoral and visualized portions of the superficial and profunda femoral arteries are patent without evidence of aneurysm, dissection, vasculitis or significant stenosis. Veins: No obvious venous abnormality within the  limitations of this arterial phase study. Right femoral central venous catheter with tip extending into the intrahepatic portion of the inferior vena cava. Review of the MIP images confirms the above findings. NON-VASCULAR Lower chest: Small bilateral pleural effusions lying dependently with areas of passive subsegmental atelectasis in the lower lobes of the lungs bilaterally. Cardiomegaly. Atherosclerotic calcifications are noted in the descending thoracic aorta as well as the left anterior descending, left circumflex and right coronary arteries. Hepatobiliary: Mild periportal edema in the liver. No suspicious cystic or solid hepatic lesions. No intra or extrahepatic biliary ductal dilatation. Amorphous intermediate attenuation material lying dependently in the gallbladder, compatible with biliary sludge and/or noncalcified gallstones. Gallbladder is not distended. No pericholecystic fluid or surrounding inflammatory changes. Pancreas: No pancreatic mass. No pancreatic ductal dilatation. No pancreatic or peripancreatic fluid collections or inflammatory changes. Spleen: Unremarkable. Adrenals/Urinary Tract: Severe atrophy of the kidneys bilaterally. Innumerable small subcentimeter low-attenuation lesions in both kidneys, too small to definitively characterize, but statistically likely to represent tiny cysts. In addition, in the upper pole of the right kidney (axial image 15 of series 12 and coronal image 88 of series 15) there is a partially exophytic partially calcified 2.5 x 1.7 x 2.1 cm lesion which demonstrates heterogeneous enhancement, concerning for possible neoplasm such as a renal cell carcinoma. No hydroureteronephrosis. Urinary bladder is nearly completely decompressed, but otherwise unremarkable in appearance. Bilateral adrenal glands are normal in appearance. Stomach/Bowel: The appearance of the stomach is unremarkable. There is no pathologic dilatation of small bowel or colon. Suture lines are evident  in the region of the rectosigmoid junction and the cecum, presumably from prior partial colectomies. No high attenuation is noted in the lumen of the colon to suggest active extravasation on today's examination. Numerous colonic diverticula are noted, without surrounding inflammatory changes to indicate an acute diverticulitis at this time. Lymphatic: No lymphadenopathy noted in the abdomen or pelvis. Reproductive: Status post hysterectomy. Ovaries are not confidently identified may be surgically absent or atrophic. Other: No significant volume of ascites.  No pneumoperitoneum. Musculoskeletal: There are no aggressive appearing lytic or blastic lesions noted in the visualized portions of the skeleton. IMPRESSION: VASCULAR 1. No acute findings. 2. Extensive aortic atherosclerosis and atherosclerosis in the visceral arteries with moderate stenosis at the ostium of the celiac axis and superior mesenteric arteries, and severe chronic stenosis of the renal arteries bilaterally. 3. Coronary artery disease. NON-VASCULAR 1. No evidence of intraluminal extravasation of contrast within the colon. 2. Extensive colonic diverticulosis, without evidence of acute diverticulitis at this time. 3. Partially calcified heterogeneously enhancing lesion extending off the medial aspect of the upper pole of the right kidney, concerning for potential renal cell carcinoma. Further evaluation with nonemergent outpatient abdominal MRI with and without IV gadolinium is recommended in the near future to provide definitive characterization. 4. Biliary sludge and/or noncalcified gallstones lying dependently in the gallbladder. No imaging findings to suggest an acute cholecystitis at this time. 5. Severe bilateral renal atrophy. 6. Small bilateral pleural effusions. 7. Cardiomegaly. 8. Additional incidental findings, as above. Electronically Signed   By: Trudie Reed M.D.   On: 11/13/2022 07:44   IR Fluoro Guide CV Line Right  Result Date:  11/12/2022 INDICATION: Poor venous access. In need of durable intravenous access for contrast administration for impending CTA to evaluate for chronic mesenteric ischemia Patient with history of end-stage renal disease with multiple previous central venous accesses, currently receiving dialysis via left jugular approach dialysis catheter. Patient with history of contrast allergy, not presently premedicated. As such, the decision was made to proceed with placement of a non tunneled femoral approach central venous catheter given concern for difficulty/inability to place a right jugular approach central venous catheter given multiple previous hemodialysis catheters. EXAM: ULTRASOUND AND FLUOROSCOPIC GUIDED NON TUNNELED CENTRAL VENOUS CATHETER INSERTION MEDICATIONS: None. CONTRAST:  None FLUOROSCOPY TIME:  18 seconds (1 mGy) COMPLICATIONS: None immediate. TECHNIQUE: The procedure, risks, benefits, and alternatives were explained to the patient and informed written consent was obtained. A timeout was performed prior to the initiation of the procedure. The right groin was prepped with chlorhexidine in a sterile fashion, and a sterile drape was applied covering the operative field. Maximum barrier sterile technique with sterile gowns and gloves were used for the procedure. A timeout was performed prior to the initiation of the procedure. Local anesthesia was provided with 1% lidocaine. Right femoral head was marked fluoroscopically. Under direct ultrasound guidance, the right common femoral vein vein was accessed with a micropuncture kit after the overlying soft tissues were anesthetized with 1% lidocaine. Real-time ultrasound guidance was utilized for vascular access including the acquisition of a permanent ultrasound image documenting patency of the accessed vessel. A guidewire was advanced to the level of the inferior caval-atrial junction for measurement purposes and the non tunneled central venous catheter was cut to  length. A peel-away sheath was placed and a 5 [place], dual lumen was inserted to level of the inferior caval-atrial junction. A post procedure spot fluoroscopic was obtained. The catheter easily aspirated and flushed and was secured in place with an interrupted suture. A dressing was applied. The patient tolerated the procedure well without immediate post procedural complication. FINDINGS: After catheter placement, the tip lies within the inferior cavoatrial junction. The catheter aspirates and flushes normally and is ready for immediate use. IMPRESSION: Successful ultrasound and fluoroscopic guided placement of a right common femoral vein approach, 5 [place], dual lumen non tunneled central venous catheter with tip at the inferior caval-atrial junction. The central venous catheter is ready for immediate use. Electronically Signed   By: Simonne Come M.D.   On: 11/12/2022 14:03   IR US Guide Vasc Access Right  Result Date: 11/12/2022 INDICATION: Poor venous access. In need of durable intravenous access for contrast administration for impending CTA to evaluate for chronic mesenteric ischemia Patient with history of end-stage renal disease with multiple previous central venous accesses, currently receiving dialysis via left jugular approach dialysis catheter. Patient with history of contrast allergy, not presently premedicated. As such, the decision was made to proceed with placement of a non tunneled femoral approach central venous catheter given concern for difficulty/inability to place a right jugular approach central venous catheter given multiple previous hemodialysis catheters. EXAM: ULTRASOUND AND FLUOROSCOPIC GUIDED NON TUNNELED CENTRAL VENOUS CATHETER INSERTION MEDICATIONS: None. CONTRAST:  None FLUOROSCOPY TIME:  18 seconds (1 mGy) COMPLICATIONS: None immediate. TECHNIQUE: The procedure, risks, benefits, and alternatives were explained to the patient and informed written consent was obtained. A timeout was  performed prior to the initiation of the procedure. The right groin was prepped with chlorhexidine in a sterile fashion, and a sterile drape was applied covering the operative field. Maximum barrier sterile technique with sterile gowns  and gloves were used for the procedure. A timeout was performed prior to the initiation of the procedure. Local anesthesia was provided with 1% lidocaine. Right femoral head was marked fluoroscopically. Under direct ultrasound guidance, the right common femoral vein vein was accessed with a micropuncture kit after the overlying soft tissues were anesthetized with 1% lidocaine. Real-time ultrasound guidance was utilized for vascular access including the acquisition of a permanent ultrasound image documenting patency of the accessed vessel. A guidewire was advanced to the level of the inferior caval-atrial junction for measurement purposes and the non tunneled central venous catheter was cut to length. A peel-away sheath was placed and a 5 [place], dual lumen was inserted to level of the inferior caval-atrial junction. A post procedure spot fluoroscopic was obtained. The catheter easily aspirated and flushed and was secured in place with an interrupted suture. A dressing was applied. The patient tolerated the procedure well without immediate post procedural complication. FINDINGS: After catheter placement, the tip lies within the inferior cavoatrial junction. The catheter aspirates and flushes normally and is ready for immediate use. IMPRESSION: Successful ultrasound and fluoroscopic guided placement of a right common femoral vein approach, 5 [place], dual lumen non tunneled central venous catheter with tip at the inferior caval-atrial junction. The central venous catheter is ready for immediate use. Electronically Signed   By: Simonne Come M.D.   On: 11/12/2022 14:03   [place] MESENTERIC ARTERIES  Result Date: 11/11/2022 CLINICAL DATA:  Abdominal pain EXAM: [place] MESENTERIC ARTERIAL DOPPLER  COMPARISON:  CT abdomen pelvis without contrast 11/02/2022 FINDINGS: Celiac axis: 141 cm/sec Unable to perform inspiration and expiration evaluation due to patient condition. Splenic artery: 175 cm/sec Hepatic artery: 45 cm/sec SMA: 315 cm/sec IMA: Not visualized Aortic size: 1.6 cm proximally, 1.2 cm in the mid segment and 1.1 cm distally IMPRESSION: Elevated peak systolic velocity in the superior mesenteric artery is suspicious for significant stenosis. Electronically Signed   By: Acquanetta Belling M.D.   On: 11/11/2022 10:25   DG Abd 1 View  Result Date: 11/07/2022 CLINICAL DATA:  Abdominal pain/distention EXAM: ABDOMEN - 1 VIEW COMPARISON:  Abdominal radiograph dated April 27th 2020 FINDINGS: Nonobstructive bowel-gas pattern. Surgical clips seen in the pelvis. No acute osseous abnormality. IMPRESSION: Nonobstructive bowel-gas pattern. Electronically Signed   By: Allegra Lai M.D.   On: 11/07/2022 17:57   DG Abd Portable 2V  Result Date: 11/05/2022 CLINICAL DATA:  Abdominal pain EXAM: PORTABLE ABDOMEN - 2 VIEW COMPARISON:  CT 11/02/2022 FINDINGS: No dilated loops of large or small bowel identified. Increasing stool burden identified within the colon. No air-fluid levels identified. Surgical clips noted within the pelvis. Left lung base opacity identified within the imaged portions of the lower chest. Patient has a left chest wall dialysis catheter with tips in the right atrium. Signs of previous CABG procedure. IMPRESSION: 1. Nonobstructive bowel gas pattern. 2. Increasing stool burden within the colon. 3. Left base opacity identified within the imaged portions of the chest. Correlate for any clinical signs/symptoms of pneumonia. Electronically Signed   By: Signa Kell M.D.   On: 11/05/2022 10:27   US Abdomen Limited RUQ (LIVER/GB)  Result Date: 11/03/2022 CLINICAL DATA:  Right upper quadrant pain EXAM: ULTRASOUND ABDOMEN LIMITED RIGHT UPPER QUADRANT COMPARISON:  CT 11/02/2022.  Older exams as well.  FINDINGS: Gallbladder: Gallbladder is mildly distended. No shadowing stones or adjacent fluid but there is gallbladder wall thickening. Common bile duct: Diameter: 4 mm Liver: No focal lesion identified. Within normal limits in parenchymal  echogenicity. Portal vein is patent on color Doppler imaging with normal direction of blood flow towards the liver. Other: None. IMPRESSION: No shadowing stones. The gallbladder wall is thickened which is nonspecific. No adjacent fluid. No reported sonographic Murphy sign. Please correlate with clinical symptomatology. If there is further concern, HIDA scan could be considered as clinically appropriate. No biliary ductal dilatation Electronically Signed   By: Karen Kays M.D.   On: 11/03/2022 14:59   CT ABDOMEN PELVIS WO CONTRAST  Result Date: 11/02/2022 CLINICAL DATA:  Acute abdominal pain after endoscopy EXAM: CT ABDOMEN AND PELVIS WITHOUT CONTRAST TECHNIQUE: Multidetector CT imaging of the abdomen and pelvis was performed following the standard protocol without IV contrast. RADIATION DOSE REDUCTION: This exam was performed according to the departmental dose-optimization program which includes automated exposure control, adjustment of the mA and/or kV according to patient size and/or use of iterative reconstruction technique. COMPARISON:  CT 04/06/2020 without contrast FINDINGS: Lower chest: Small bilateral pleural effusions identified with some adjacent opacities. Atelectasis is favored over infiltrate recommend follow-up. The heart is enlarged. Status post median sternotomy. Coronary artery calcifications are seen. Hepatobiliary: On this non IV contrast exam, the liver has some small calcifications, possibly related to old granulomatous disease. However the gallbladder is distended with some slight wall thickening and stranding. There is also some luminal high density. Sludge or stones are possible. If there is concern further of acute cholecystitis an ultrasound may be  of some benefit as the next step in the workup. Pancreas: Moderate atrophy of the pancreas.  No obvious mass. Spleen: Spleen is nonenlarged. Adrenals/Urinary Tract: The adrenal glands are grossly preserved. There is severe atrophy of the kidneys with several vascular calcifications and tiny cystic areas. The more complex hyperdense area along the upper pole of the right kidney is stable. Previously measuring 2.8 x 2.0 cm and today 2.5 by 1.5 cm. Contracted urinary bladder. Stomach/Bowel: There is some fluid and debris in the stomach. Moderate diffuse scattered colonic stool. Left-sided colonic diverticulosis. Surgical changes along the rectosigmoid colon. No obstruction. Surgical changes as well along loops of bowel in the right lower quadrant. The appendix is not well seen. There are several loops of small bowel in the midabdomen which have air-fluid levels and are mildly dilated measuring up to 3.2 cm. No abrupt caliber change. Vascular/Lymphatic: Extensive vascular calcifications. Normal caliber aorta and IVC. No specific abnormal lymph node enlargement identified in the abdomen and pelvis. Reproductive: Status post hysterectomy. No adnexal masses. Other: Anasarca. No obstruction, free air or free fluid. Mild areas of mesenteric stranding and presacral space stranding. Musculoskeletal: Osteopenia. Scattered degenerative changes. Schmorl's node changes along lumbar spine and thoracic spine. IMPRESSION: Once again severely atrophic kidneys with calcifications and cystic areas including complex area along the upper pole of the right kidney which is stable in size. Recommend continued surveillance. Developing small bilateral pleural effusion with adjacent lung opacities. Atelectasis versus infiltrate. Recommend follow-up. Increasing anasarca mesenteric stranding. There are some mildly dilated loops of small bowel with some air-fluid levels but no abrupt transition. Ileus versus mild partial obstruction is in the  differential recommend close follow-up. Colonic diverticulosis. Distended gallbladder with wall thickening and some stranding. In addition there is some high attenuation material in the gallbladder. Please correlate with any sludge or stones. Please correlate with any prior contrast administration. Overall recommend further workup with ultrasound to further delineate when able. Electronically Signed   By: Karen Kays M.D.   On: 11/02/2022 14:27   MR BRAIN WO  CONTRAST  Result Date: 10/31/2022 CLINICAL DATA:  Altered mental status EXAM: MRI HEAD WITHOUT CONTRAST TECHNIQUE: Multiplanar, multiecho pulse sequences of the brain and surrounding structures were obtained without intravenous contrast. COMPARISON:  04/23/2022 MRI, correlation is also made with 10/30/2022 CT head FINDINGS: Brain: No restricted diffusion to suggest acute or subacute infarct. No acute hemorrhage, mass, or midline shift. No hydrocephalus. 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 Normal pituitary and craniocervical junction. Remote lacunar infarcts in the bilateral basal ganglia and left cerebellum. Punctate foci of hemosiderin deposition in the right cerebellum, right campus, and right frontal and parietal lobes, possibly sequela of prior microhemorrhage. No superficial siderosis. Confluent T2 hyperintense signal in the periventricular white matter, likely the sequela of moderate chronic small vessel ischemic disease. Vascular: Normal arterial flow voids. Skull and upper cervical spine: Normal marrow signal. Sinuses/Orbits: Mucosal thickening in the left sphenoid sinus. No acute finding in the orbits. Other: Trace fluid in the mastoid air cells. IMPRESSION: 1. No acute intracranial process. No evidence of acute or subacute infarct. 2. 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 Electronically Signed   By: Wiliam Ke M.D.   On: 10/31/2022 19:31   EEG adult  Result Date: 10/31/2022 Charlsie Quest, MD     10/31/2022  2:57 PM Patient Name: LATEKA TEODOSIO MRN: [id number] Epilepsy Attending: Charlsie Quest Referring Physician/Provider: Rejeana Brock, MD Date:10/31/2022 Duration: 22.39 mins Patient history: 82yo F getting eeg to evaluate for seizure Level of alertness: Awake, asleep AEDs during EEG study: LEV Technical aspects: This EEG study was done with scalp electrodes positioned according to the 10-20 International system of electrode placement. Electrical activity was reviewed with band pass filter of 1-70Hz , sensitivity of 7 uV/mm, display speed of 39mm/sec with a 60Hz  notched filter applied as appropriate. EEG data were recorded continuously and digitally stored.  Video monitoring was available and reviewed as appropriate. Description: The posterior dominant rhythm consists of 8-9 Hz activity of moderate voltage (25-35 uV) seen predominantly in posterior head regions, symmetric and reactive to eye opening and eye closing. Sleep was characterized by vertex waves, sleep spindles (12 to 14 Hz), maximal frontocentral region.  EEG showed intermittent generalized 3 to 6 Hz theta-delta slowing. Hyperventilation and photic stimulation were not performed.   ABNORMALITY - Intermittent slow, generalized IMPRESSION: This study is suggestive of mild diffuse encephalopathy. No seizures or epileptiform discharges were seen throughout the recording. Charlsie Quest   DG Abd 1 View  Result Date: 10/31/2022 CLINICAL DATA:  Abdominal pain EXAM: ABDOMEN - 1 VIEW COMPARISON:  08/29/2022 FINDINGS: Bowel gas pattern is nonspecific. Gas and stool are noted in colon. Small amount of stool is seen in colon. There are surgical staples and clips in lower abdomen and pelvis. There is no identifiable electronic device in abdomen and pelvis in the current study. In  the previous examination, an electronic device, capsule most noted in region of rectum suggesting interval passage. Arterial calcifications are seen. IMPRESSION: Nonspecific bowel gas pattern. There is no evidence of any metallic electronic capsule in the abdomen and pelvis. Electronically Signed   By: Ernie Avena M.D.   On: 10/31/2022 08:40   CT HEAD CODE STROKE WO CONTRAST  Result Date: 10/30/2022 CLINICAL DATA:  Code stroke.  Seizure, altered mental status. EXAM: CT HEAD WITHOUT CONTRAST TECHNIQUE: Contiguous axial images were obtained from the base of the skull through the vertex without intravenous contrast. RADIATION DOSE REDUCTION: This exam was performed according to the departmental dose-optimization program which includes automated exposure control, adjustment of the mA and/or kV according to patient size and/or use of iterative reconstruction technique. COMPARISON:  CT head 08/26/2022. FINDINGS: Brain: There is no evidence of acute territorial infarct. There are low-density subdural collections overlying both cerebral hemispheres measuring up to 6 mm on the left and 3 mm on the right without evidence of acute blood products, new since the prior head CT from 08/26/2022. There is no mass effect on the underlying brain parenchyma. Parenchymal volume loss with prominence of the ventricular system and extra-axial CSF spaces is stable. The ventricles are unchanged in size and configuration. A remote infarct in the left basal ganglia is unchanged. Additional confluent hypodensity in the supratentorial white matter likely reflects sequela of underlying chronic small-vessel ischemic change. The pituitary and suprasellar region are normal. There is no mass lesion. There is no midline shift. Vascular: There is calcification of the bilateral carotid siphons and vertebral arteries. Skull: Normal. Negative for fracture or focal lesion. Sinuses/Orbits: The paranasal sinuses are clear. The globes and orbits  are unremarkable. Other: None. ASPECTS [facility] Stroke Program Early CT Score) - Ganglionic level infarction (caudate, lentiform nuclei, internal capsule, insula, M1-M3 cortex): 7 - Supraganglionic infarction (M4-M6 cortex): 3 Total score (0-10 with 10 being normal): 10 IMPRESSION: 1. No evidence of acute intracranial hemorrhage or acute territorial infarct. 2. Low-density subdural hygromas overlying both cerebral hemispheres measuring up to 6 mm on the left and 3 mm on the right, new since the ct head from 08/26/2022 but without evidence of acute blood within the collections. These results communicated via Amion at the time of interpretation on 10/30/2022 at 6:44 pm to provider [facility] , who verbally acknowledged these results. Electronically Signed   By: Lesia Hausen M.D.   On: 10/30/2022 18:46   DG Chest Port 1 View  Result Date: 10/28/2022 CLINICAL DATA:  Cough EXAM: PORTABLE CHEST 1 VIEW COMPARISON:  10/23/2022 FINDINGS: Prior CABG. Left dialysis catheter remains in place with the tip in the right atrium. Mild cardiomegaly. No overt edema. Bibasilar atelectasis. No effusions or acute bony abnormality. IMPRESSION: Cardiomegaly, bibasilar atelectasis. Electronically Signed   By: Charlett Nose M.D.   On: 10/28/2022 22:15    Labs:  CBC: Recent Labs    11/11/22 0407 11/13/22 0219 11/14/22 0840 11/18/22 1957  WBC 7.8 13.0* 18.7* 11.7*  HGB 8.8* 9.2* 8.9* 9.2*  HCT 26.7* 27.5* 26.0* 27.9*  PLT 229 271 297 292    COAGS: No results for input(s): "INR", "APTT" in the last 8760 hours.  BMP: Recent Labs    11/11/22 0407 11/13/22 0219 11/14/22 0840 11/18/22 1957  NA 131* 128* 127* 130*  K 4.0 5.2* 4.8 3.8  CL 95* 94* 89* 95*  CO2 23 22 22  20*  GLUCOSE 98 182* 134* 122*  BUN 47* 45* 76* 59*  CALCIUM 8.0* 8.6* 8.5* 8.8*  CREATININE 5.56* 5.78* 7.76* 8.18*  GFRNONAA 7* 7* 5* 5*    LIVER FUNCTION TESTS: Recent Labs    10/30/22 1900 11/02/22 0537 11/03/22 1156 11/04/22 0351  11/05/22 0426 11/09/22 1947 11/13/22 0219 11/14/22 0840 11/18/22 1957  BILITOT 1.0  --  0.2* 0.8  --   --   --   --  0.9  AST 15  --  12* 11*  --   --   --   --  <5*  ALT 13  --  12 11  --   --   --   --  9  ALKPHOS 103  --  85 74  --   --   --   --  67  PROT 6.6  --  5.9* 5.3*  --   --   --   --  6.7  ALBUMIN 3.4*   < > 3.0* 2.7*  2.7*   < > 3.1* 2.9* 3.0* 3.4*   < > = values in this interval not displayed.    TUMOR MARKERS: No results for input(s): "AFPTM", "CEA", "CA199", "CHROMGRNA" in the last 8760 hours.  Assessment and Plan:  Shawna [name] is a 82 y.o. female With complex past medical history significant for CAD, post coronary stent placement), chronic bronchitis, end-stage renal disease, on dialysis, GI bleeding due to AVMs, hypertension, hyperlipidemia, gout, ovarian cancer, colon cancer and TIA who is seen today in telemedicine consultation for posthospitalization evaluation.  The patient states that since she has been discharged from the hospital she has NOT experienced any additional abdominal pain though does report persistent problems with diarrhea and loose stools.  She denies melena or hematochezia but does report pain associated with excessive wiping.  Patient denies change in appetite or energy level.  No "fear of food."  Patient denies any recent changes in her weight.  The following examination were personally reviewed in detail:  Mesenteric ultrasound - 11/11/2022 - Borderline elevated peak systolic velocities are demonstrated within the proximal aspect of the SMA (representative images 37 through 52).  No evidence of a hemodynamically significant narrowing affecting the celiac artery.  The IMA was not imaged.  CTA abdomen and pelvis - 11/13/2022 - There is a minimal amount of eccentric noncalcified atherosclerotic plaque involving the origin of the celiac artery, not felt to result in a hemodynamically significant narrowing.  There is a moderate amount of mixed  calcified and noncalcified atherosclerotic plaque involving the origin of the SMA, not a failure resulting in hemodynamically significant narrowing (best appreciated on sagittal image 106, series 9).  The IMA appears patent.  There is a minimal amount of calcified atherosclerotic plaque involving in the bilateral normal caliber common iliac arteries, not resulting in a hemodynamically significant stenosis. ____________________________________________________________________________  The patient's abdominal pain has fortunately resolved since her admission.  She does report persistent issues with diarrhea though denies classic symptoms of chronic mesenteric ischemia such as postprandial abdominal pain, fear of food and unintentional weight loss.  These findings are associated by lack of definitive evidence of a hemodynamically significant narrowing affecting at least 2 of the 3 major mesenteric vessels.  It appears her persistent symptoms may be attributable to a enteritis and is unlikely to be associated with an ischemic etiology.  Given above, dedicated intervention such as an arteriogram is not warranted at this time.  Recommend continued follow-up with patient's primary care, nephrologist and GI physician.  A copy of this report was sent to the requesting provider on this date.  Electronically Signed: Simonne Come 11/24/2022, 2:24 PM   I spent a total of 15 Minutes in remote  clinical consultation, greater than 50% of which was counseling/coordinating care for abdominal pain.    Visit type: Audio only (telephone). Audio (no video) only due to patient's lack of internet/smartphone capability. Alternative for in-person consultation at [facility], [address] [name], [place], [place]. This visit type  was conducted due to national recommendations for restrictions regarding the COVID-19 Pandemic (e.g. social distancing).  This format is felt to be most appropriate for this patient at this  time.  All issues noted in this document were discussed and addressed.

## 2022-11-25 NOTE — Telephone Encounter (Signed)
Per Milon Dikes with [facility],  Happy Friday Shawna [name],  Wanted to provide a quick update on [name].  Everything looks great with the referral.  We have to wait until 5/28 before insurance will allow [place] to bill for next refill. We should be able to get her scheduled soon after  5/28.  Let me know if you have any questions or additional referrals.  Have a fantastic weekend.

## 2022-11-25 NOTE — Telephone Encounter (Signed)
Thanks for the update, great to hear

## 2022-11-30 ENCOUNTER — Other Ambulatory Visit: Payer: Self-pay

## 2022-11-30 ENCOUNTER — Inpatient Hospital Stay (HOSPITAL_COMMUNITY)
Admission: EM | Admit: 2022-11-30 | Discharge: 2022-12-13 | DRG: 377 | Disposition: A | Discharge status: Skilled Nursing Facility | Payer: Medicare Other | Attending: Internal Medicine | Admitting: Internal Medicine

## 2022-11-30 DIAGNOSIS — K625 Hemorrhage of anus and rectum: Secondary | ICD-10-CM | POA: Diagnosis present

## 2022-11-30 DIAGNOSIS — Z888 Allergy status to other drugs, medicaments and biological substances status: Secondary | ICD-10-CM

## 2022-11-30 DIAGNOSIS — D631 Anemia in chronic kidney disease: Secondary | ICD-10-CM | POA: Diagnosis present

## 2022-11-30 DIAGNOSIS — Z79890 Hormone replacement therapy (postmenopausal): Secondary | ICD-10-CM

## 2022-11-30 DIAGNOSIS — Z79899 Other long term (current) drug therapy: Secondary | ICD-10-CM

## 2022-11-30 DIAGNOSIS — D649 Anemia, unspecified: Secondary | ICD-10-CM

## 2022-11-30 DIAGNOSIS — E119 Type 2 diabetes mellitus without complications: Secondary | ICD-10-CM | POA: Diagnosis not present

## 2022-11-30 DIAGNOSIS — K219 Gastro-esophageal reflux disease without esophagitis: Secondary | ICD-10-CM | POA: Diagnosis present

## 2022-11-30 DIAGNOSIS — E1122 Type 2 diabetes mellitus with diabetic chronic kidney disease: Secondary | ICD-10-CM | POA: Diagnosis present

## 2022-11-30 DIAGNOSIS — I081 Rheumatic disorders of both mitral and tricuspid valves: Secondary | ICD-10-CM | POA: Diagnosis present

## 2022-11-30 DIAGNOSIS — J42 Unspecified chronic bronchitis: Secondary | ICD-10-CM | POA: Diagnosis present

## 2022-11-30 DIAGNOSIS — K5711 Diverticulosis of small intestine without perforation or abscess with bleeding: Secondary | ICD-10-CM | POA: Diagnosis present

## 2022-11-30 DIAGNOSIS — I5022 Chronic systolic (congestive) heart failure: Secondary | ICD-10-CM | POA: Diagnosis present

## 2022-11-30 DIAGNOSIS — I2510 Atherosclerotic heart disease of native coronary artery without angina pectoris: Secondary | ICD-10-CM | POA: Diagnosis present

## 2022-11-30 DIAGNOSIS — K31811 Angiodysplasia of stomach and duodenum with bleeding: Secondary | ICD-10-CM | POA: Diagnosis present

## 2022-11-30 DIAGNOSIS — R1084 Generalized abdominal pain: Secondary | ICD-10-CM

## 2022-11-30 DIAGNOSIS — F419 Anxiety disorder, unspecified: Secondary | ICD-10-CM | POA: Diagnosis present

## 2022-11-30 DIAGNOSIS — L89302 Pressure ulcer of unspecified buttock, stage 2: Secondary | ICD-10-CM | POA: Diagnosis not present

## 2022-11-30 DIAGNOSIS — K921 Melena: Secondary | ICD-10-CM | POA: Diagnosis not present

## 2022-11-30 DIAGNOSIS — E039 Hypothyroidism, unspecified: Secondary | ICD-10-CM | POA: Diagnosis present

## 2022-11-30 DIAGNOSIS — K5521 Angiodysplasia of colon with hemorrhage: Secondary | ICD-10-CM | POA: Diagnosis present

## 2022-11-30 DIAGNOSIS — G40909 Epilepsy, unspecified, not intractable, without status epilepticus: Secondary | ICD-10-CM | POA: Diagnosis present

## 2022-11-30 DIAGNOSIS — Z881 Allergy status to other antibiotic agents status: Secondary | ICD-10-CM

## 2022-11-30 DIAGNOSIS — K529 Noninfective gastroenteritis and colitis, unspecified: Secondary | ICD-10-CM | POA: Diagnosis present

## 2022-11-30 DIAGNOSIS — R509 Fever, unspecified: Secondary | ICD-10-CM | POA: Diagnosis not present

## 2022-11-30 DIAGNOSIS — R197 Diarrhea, unspecified: Secondary | ICD-10-CM | POA: Diagnosis not present

## 2022-11-30 DIAGNOSIS — I953 Hypotension of hemodialysis: Secondary | ICD-10-CM | POA: Diagnosis present

## 2022-11-30 DIAGNOSIS — Z882 Allergy status to sulfonamides status: Secondary | ICD-10-CM

## 2022-11-30 DIAGNOSIS — D62 Acute posthemorrhagic anemia: Secondary | ICD-10-CM | POA: Diagnosis present

## 2022-11-30 DIAGNOSIS — K5520 Angiodysplasia of colon without hemorrhage: Secondary | ICD-10-CM | POA: Diagnosis not present

## 2022-11-30 DIAGNOSIS — K5710 Diverticulosis of small intestine without perforation or abscess without bleeding: Secondary | ICD-10-CM | POA: Diagnosis not present

## 2022-11-30 DIAGNOSIS — I447 Left bundle-branch block, unspecified: Secondary | ICD-10-CM | POA: Diagnosis present

## 2022-11-30 DIAGNOSIS — Z8673 Personal history of transient ischemic attack (TIA), and cerebral infarction without residual deficits: Secondary | ICD-10-CM

## 2022-11-30 DIAGNOSIS — Y832 Surgical operation with anastomosis, bypass or graft as the cause of abnormal reaction of the patient, or of later complication, without mention of misadventure at the time of the procedure: Secondary | ICD-10-CM | POA: Diagnosis present

## 2022-11-30 DIAGNOSIS — Z8543 Personal history of malignant neoplasm of ovary: Secondary | ICD-10-CM

## 2022-11-30 DIAGNOSIS — N186 End stage renal disease: Secondary | ICD-10-CM | POA: Diagnosis present

## 2022-11-30 DIAGNOSIS — I132 Hypertensive heart and chronic kidney disease with heart failure and with stage 5 chronic kidney disease, or end stage renal disease: Secondary | ICD-10-CM | POA: Diagnosis present

## 2022-11-30 DIAGNOSIS — Z8249 Family history of ischemic heart disease and other diseases of the circulatory system: Secondary | ICD-10-CM

## 2022-11-30 DIAGNOSIS — Z955 Presence of coronary angioplasty implant and graft: Secondary | ICD-10-CM

## 2022-11-30 DIAGNOSIS — Z85038 Personal history of other malignant neoplasm of large intestine: Secondary | ICD-10-CM

## 2022-11-30 DIAGNOSIS — Z833 Family history of diabetes mellitus: Secondary | ICD-10-CM

## 2022-11-30 DIAGNOSIS — Z886 Allergy status to analgesic agent status: Secondary | ICD-10-CM

## 2022-11-30 DIAGNOSIS — Z90710 Acquired absence of both cervix and uterus: Secondary | ICD-10-CM

## 2022-11-30 DIAGNOSIS — I10 Essential (primary) hypertension: Secondary | ICD-10-CM | POA: Diagnosis not present

## 2022-11-30 DIAGNOSIS — Z8719 Personal history of other diseases of the digestive system: Secondary | ICD-10-CM | POA: Diagnosis not present

## 2022-11-30 DIAGNOSIS — K449 Diaphragmatic hernia without obstruction or gangrene: Secondary | ICD-10-CM | POA: Diagnosis present

## 2022-11-30 DIAGNOSIS — I480 Paroxysmal atrial fibrillation: Secondary | ICD-10-CM | POA: Diagnosis present

## 2022-11-30 DIAGNOSIS — Z951 Presence of aortocoronary bypass graft: Secondary | ICD-10-CM | POA: Diagnosis not present

## 2022-11-30 DIAGNOSIS — Z7982 Long term (current) use of aspirin: Secondary | ICD-10-CM

## 2022-11-30 DIAGNOSIS — Z9049 Acquired absence of other specified parts of digestive tract: Secondary | ICD-10-CM

## 2022-11-30 DIAGNOSIS — E876 Hypokalemia: Secondary | ICD-10-CM | POA: Diagnosis present

## 2022-11-30 DIAGNOSIS — Z992 Dependence on renal dialysis: Secondary | ICD-10-CM

## 2022-11-30 DIAGNOSIS — N2581 Secondary hyperparathyroidism of renal origin: Secondary | ICD-10-CM | POA: Diagnosis present

## 2022-11-30 DIAGNOSIS — Z7952 Long term (current) use of systemic steroids: Secondary | ICD-10-CM

## 2022-11-30 DIAGNOSIS — T82868A Thrombosis of vascular prosthetic devices, implants and grafts, initial encounter: Secondary | ICD-10-CM | POA: Diagnosis present

## 2022-11-30 DIAGNOSIS — Z86010 Personal history of colonic polyps: Secondary | ICD-10-CM

## 2022-11-30 DIAGNOSIS — Z8711 Personal history of peptic ulcer disease: Secondary | ICD-10-CM

## 2022-11-30 DIAGNOSIS — L89892 Pressure ulcer of other site, stage 2: Secondary | ICD-10-CM | POA: Diagnosis present

## 2022-11-30 DIAGNOSIS — E872 Acidosis, unspecified: Secondary | ICD-10-CM | POA: Diagnosis present

## 2022-11-30 DIAGNOSIS — E7800 Pure hypercholesterolemia, unspecified: Secondary | ICD-10-CM | POA: Diagnosis present

## 2022-11-30 DIAGNOSIS — L89300 Pressure ulcer of unspecified buttock, unstageable: Secondary | ICD-10-CM | POA: Diagnosis not present

## 2022-11-30 DIAGNOSIS — K31819 Angiodysplasia of stomach and duodenum without bleeding: Secondary | ICD-10-CM | POA: Diagnosis not present

## 2022-11-30 DIAGNOSIS — Z91041 Radiographic dye allergy status: Secondary | ICD-10-CM

## 2022-11-30 DIAGNOSIS — K922 Gastrointestinal hemorrhage, unspecified: Secondary | ICD-10-CM | POA: Diagnosis present

## 2022-11-30 DIAGNOSIS — Z9851 Tubal ligation status: Secondary | ICD-10-CM

## 2022-11-30 DIAGNOSIS — L8990 Pressure ulcer of unspecified site, unspecified stage: Secondary | ICD-10-CM

## 2022-11-30 LAB — TYPE AND SCREEN
Donor AG Type: NEGATIVE
Donor AG Type: NEGATIVE
Unit division: 0

## 2022-11-30 LAB — COMPREHENSIVE METABOLIC PANEL
ALT: 11 U/L (ref 0–44)
AST: 6 U/L — ABNORMAL LOW (ref 15–41)
Albumin: 3.3 g/dL — ABNORMAL LOW (ref 3.5–5.0)
Alkaline Phosphatase: 62 U/L (ref 38–126)
Anion gap: 20 — ABNORMAL HIGH (ref 5–15)
BUN: 59 mg/dL — ABNORMAL HIGH (ref 8–23)
CO2: 14 mmol/L — ABNORMAL LOW (ref 22–32)
Calcium: 7.9 mg/dL — ABNORMAL LOW (ref 8.9–10.3)
Chloride: 101 mmol/L (ref 98–111)
Creatinine, Ser: 7.84 mg/dL — ABNORMAL HIGH (ref 0.44–1.00)
GFR, Estimated: 5 mL/min — ABNORMAL LOW (ref >60)
Glucose, Bld: 118 mg/dL — ABNORMAL HIGH (ref 70–99)
Potassium: 3.4 mmol/L — ABNORMAL LOW (ref 3.5–5.1)
Sodium: 135 mmol/L (ref 135–145)
Total Bilirubin: 0.7 mg/dL (ref 0.3–1.2)
Total Protein: 7.1 g/dL (ref 6.5–8.1)

## 2022-11-30 LAB — CBC WITH DIFFERENTIAL/PLATELET
Abs Immature Granulocytes: 0.04 10*3/uL (ref 0.00–0.07)
Basophils Absolute: 0.1 10*3/uL (ref 0.0–0.1)
Basophils Relative: 1 %
Eosinophils Absolute: 0.1 10*3/uL (ref 0.0–0.5)
Eosinophils Relative: 1 %
HCT: 23 % — ABNORMAL LOW (ref 36.0–46.0)
Hemoglobin: 7.1 g/dL — ABNORMAL LOW (ref 12.0–15.0)
Immature Granulocytes: 0 %
Lymphocytes Relative: 6 %
Lymphs Abs: 0.6 10*3/uL — ABNORMAL LOW (ref 0.7–4.0)
MCH: 35.9 pg — ABNORMAL HIGH (ref 26.0–34.0)
MCHC: 30.9 g/dL (ref 30.0–36.0)
MCV: 116.2 fL — ABNORMAL HIGH (ref 80.0–100.0)
Monocytes Absolute: 0.8 10*3/uL (ref 0.1–1.0)
Monocytes Relative: 9 %
Neutro Abs: 7.8 10*3/uL — ABNORMAL HIGH (ref 1.7–7.7)
Neutrophils Relative %: 83 %
Platelets: 261 10*3/uL (ref 150–400)
RBC: 1.98 MIL/uL — ABNORMAL LOW (ref 3.87–5.11)
RDW: 25.7 % — ABNORMAL HIGH (ref 11.5–15.5)
WBC: 9.5 10*3/uL (ref 4.0–10.5)
nRBC: 0 % (ref 0.0–0.2)

## 2022-11-30 LAB — PREPARE RBC (CROSSMATCH)

## 2022-11-30 LAB — CLOSTRIDIUM DIFFICILE BY PCR, REFLEXED: Toxigenic C. Difficile by PCR: NEGATIVE

## 2022-11-30 LAB — C DIFFICILE QUICK SCREEN W PCR REFLEX
C Diff antigen: POSITIVE — AB
C Diff toxin: NEGATIVE

## 2022-11-30 LAB — POC OCCULT BLOOD, ED: Fecal Occult Bld: POSITIVE — AB

## 2022-11-30 LAB — BPAM RBC: Blood Product Expiration Date: 202406292359

## 2022-11-30 LAB — HEPATITIS B SURFACE ANTIGEN: Hepatitis B Surface Ag: NONREACTIVE

## 2022-11-30 MED ORDER — ACETAMINOPHEN 325 MG PO TABS
650.0000 mg | ORAL_TABLET | Freq: Four times a day (QID) | ORAL | Status: DC | PRN
  Administered 2022-12-03 – 2022-12-12 (×8): 650 mg via ORAL
  Filled 2022-11-30 (×10): qty 2

## 2022-11-30 MED ORDER — FOLIC ACID 1 MG PO TABS
1.0000 mg | ORAL_TABLET | Freq: Every day | ORAL | Status: DC
  Administered 2022-11-30 – 2022-12-13 (×14): 1 mg via ORAL
  Filled 2022-11-30 (×14): qty 1

## 2022-11-30 MED ORDER — LEVOTHYROXINE SODIUM 75 MCG PO TABS
75.0000 ug | ORAL_TABLET | Freq: Every day | ORAL | Status: DC
  Administered 2022-12-01 – 2022-12-13 (×13): 75 ug via ORAL
  Filled 2022-11-30 (×13): qty 1

## 2022-11-30 MED ORDER — PANTOPRAZOLE SODIUM 40 MG IV SOLR
40.0000 mg | Freq: Two times a day (BID) | INTRAVENOUS | Status: DC
  Administered 2022-11-30 – 2022-12-06 (×14): 40 mg via INTRAVENOUS
  Filled 2022-11-30 (×16): qty 10

## 2022-11-30 MED ORDER — ATORVASTATIN CALCIUM 10 MG PO TABS
10.0000 mg | ORAL_TABLET | Freq: Every day | ORAL | Status: DC
  Administered 2022-11-30 – 2022-12-12 (×13): 10 mg via ORAL
  Filled 2022-11-30 (×13): qty 1

## 2022-11-30 MED ORDER — ALTEPLASE 2 MG IJ SOLR: 2.0000 mg | Freq: Once | INTRAMUSCULAR | Status: DC | PRN

## 2022-11-30 MED ORDER — CHLORHEXIDINE GLUCONATE CLOTH 2 % EX PADS
6.0000 | MEDICATED_PAD | Freq: Every day | CUTANEOUS | Status: DC
  Administered 2022-11-30 – 2022-12-13 (×13): 6 via TOPICAL

## 2022-11-30 MED ORDER — SODIUM CHLORIDE 0.9% IV SOLUTION: Freq: Once | INTRAVENOUS | Status: AC

## 2022-11-30 MED ORDER — HEPARIN SODIUM (PORCINE) 1000 UNIT/ML DIALYSIS
1000.0000 [IU] | INTRAMUSCULAR | Status: DC | PRN
  Administered 2022-11-30: 3800 [IU]

## 2022-11-30 MED ORDER — GABAPENTIN 100 MG PO CAPS
100.0000 mg | ORAL_CAPSULE | Freq: Every day | ORAL | Status: DC
  Administered 2022-11-30 – 2022-12-07 (×8): 100 mg via ORAL
  Filled 2022-11-30 (×8): qty 1

## 2022-11-30 MED ORDER — HYDRALAZINE HCL 20 MG/ML IJ SOLN
10.0000 mg | Freq: Four times a day (QID) | INTRAMUSCULAR | Status: DC | PRN
  Administered 2022-11-30: 10 mg via INTRAVENOUS
  Filled 2022-11-30 (×3): qty 1

## 2022-11-30 MED ORDER — FAMOTIDINE IN NACL 20-0.9 MG/50ML-% IV SOLN: 20.0000 mg | Freq: Every day | INTRAVENOUS | Status: DC

## 2022-11-30 MED ORDER — ANTICOAGULANT SODIUM CITRATE 4% (200MG/5ML) IV SOLN: 5.0000 mL | Status: DC | PRN

## 2022-11-30 MED ORDER — PANTOPRAZOLE SODIUM 40 MG IV SOLR: 40.0000 mg | Freq: Two times a day (BID) | INTRAVENOUS | Status: DC

## 2022-11-30 MED ORDER — PROCHLORPERAZINE EDISYLATE 10 MG/2ML IJ SOLN
5.0000 mg | Freq: Four times a day (QID) | INTRAMUSCULAR | Status: DC | PRN
  Administered 2022-12-12: 5 mg via INTRAVENOUS
  Filled 2022-11-30 (×2): qty 2

## 2022-11-30 MED ORDER — SEVELAMER CARBONATE 800 MG PO TABS
3200.0000 mg | ORAL_TABLET | Freq: Three times a day (TID) | ORAL | Status: DC
  Administered 2022-11-30 – 2022-12-13 (×28): 3200 mg via ORAL
  Filled 2022-11-30 (×32): qty 4

## 2022-11-30 MED ORDER — LEVETIRACETAM 500 MG PO TABS
500.0000 mg | ORAL_TABLET | Freq: Every day | ORAL | Status: DC
  Administered 2022-11-30 – 2022-12-13 (×14): 500 mg via ORAL
  Filled 2022-11-30 (×14): qty 1

## 2022-11-30 MED ORDER — HYDROXYZINE HCL 25 MG PO TABS
25.0000 mg | ORAL_TABLET | Freq: Four times a day (QID) | ORAL | Status: DC | PRN
  Administered 2022-12-07 – 2022-12-13 (×2): 25 mg via ORAL
  Filled 2022-11-30 (×2): qty 1

## 2022-11-30 NOTE — Hospital Course (Addendum)
Shawna [name] was admitted to the hospital with the working diagnosis of recurrent upper gastrointestinal bleeding.   82 y.o. female with past medical history of end-stage renal disease on hemodialysis Monday Wednesday Friday, seizure disorder, paroxysmal atrial fibrillation not on anticoagulation secondary to history of GI bleed, hypertension, hyperlipidemia, CAD status post CABG and PCI, hypothyroidism, small bowel AVM who was recently admitted for  recurrent GI bleeding with acute on chronic blood loss anemia, to the hospital with frequent loose bowel movements for last 3 days with some blood when wiping wiping. On her initial physical examination her blood pressure was 156/62, HR 62, RR 19 and 02 saturation 98%, lungs with no wheezing or rales, heart with S1 and S2 present irregularly irregular, abdomen with no distention or tenderness, no lower extremity edema.   Na 135, K 3,4 Cl 101, bicarbonate 14 glucose 118, bun 59 cr 7,84 AST 6 ALT 11  Wbc 1,98 hgb 7,1 plt 261  C diff antigen positive, C diff toxin negative, PCR negative.  GI panel infectious negative.   Chest radiograph with cardiomegaly, small bilateral pleural effusions, sternotomy wires in place, HD catheter left subclavian vein.   EKG 113 bpm, left axis deviation, left bundle branch block, prolonged qtc, atrial fibrillation rhythm with no significant ST segment or T wave changes.   Fecal occult test was positive.   1 unit of packed RBC was transfused.   GI consulted and patient underwent small bowel enteroscopy, finding numerous jejunal AVMs noted and treated with APC.  05/28 no further bleeding, plan to discharge home today, follow up as outpatient.

## 2022-11-30 NOTE — Progress Notes (Signed)
Received patient in bed.Alert and oriented x 4.  Accessed used : Right HD catheter that worked well.Dressing changed done today.  Duration of treatment : 3.5 hours.  Fluid removed : Achieved prescribed 1.5 liters UF goal.  Hemo comment: Tolerated treatment.  Hand off to the patient's nurse.

## 2022-11-30 NOTE — ED Provider Notes (Signed)
[place] EMERGENCY DEPARTMENT AT [facility] Provider Note   CSN: [id number] Arrival date & time: 11/30/22  4098     History  Chief Complaint  Patient presents with   Diarrhea    Shawna [name] is a 82 y.o. female with a PMHx of recurrent GI bleeds, dialysis (MWF), who presents to the ED with concerns for diarrhea x 4 weeks. Has had soft stools daily for the past month. Notes blood when wiping and on the tissue. Denies blood in the toilet. Denies history of hemorrhoids. Denies melena, nausea, vomiting, constipation, urinary symptoms, vaginal bleeding, hemoptysis, chest pain, shortness of breath, palpitations.    The history is provided by the patient. No language interpreter was used.       Home Medications Prior to Admission medications   Medication Sig Start Date End Date Taking? Authorizing Provider  ALPRAZolam Prudy Feeler) 0.5 MG tablet Take 1 tablet (0.5 mg total) by mouth daily as needed for anxiety. 11/15/22   Leroy Sea, MD  amiodarone (PACERONE) 100 MG tablet Take 1 tablet (100 mg total) by mouth daily. 04/27/22   Rhetta Mura, MD  amoxicillin-clavulanate (AUGMENTIN) 500-125 MG tablet Take 1 tablet by mouth 2 (two) times daily. 11/15/22   Leroy Sea, MD  Aspirin 81 MG CAPS Take 1 capsule by mouth daily at 12 noon.    [provider]  atorvastatin (LIPITOR) 10 MG tablet Take 1 tablet by mouth at bedtime.    [provider]  benzonatate (TESSALON) 100 MG capsule Take 100 mg by mouth 3 (three) times daily as needed for cough. 05/10/22   [provider]  carvedilol (COREG) 3.125 MG tablet Take 1 tablet (3.125 mg total) by mouth 2 (two) times daily with a meal. 11/15/22   Leroy Sea, MD  cyclobenzaprine (FLEXERIL) 5 MG tablet Take 5 mg by mouth 3 (three) times daily as needed.    [provider]  folic acid (FOLVITE) 1 MG tablet Take 1 mg by mouth daily. 05/24/22   [provider]  gabapentin (NEURONTIN)  100 MG capsule TAKE 1 CAPSULE BY MOUTH IN THE EVENING    [provider]  guaiFENesin-codeine 100-10 MG/5ML syrup Take 5 mLs by mouth every 4 (four) hours as needed for cough. 10/23/22   Jacalyn Lefevre, MD  hydrALAZINE (APRESOLINE) 100 MG tablet Take 1 tablet (100 mg total) by mouth every 8 (eight) hours. 11/15/22   Leroy Sea, MD  hydrOXYzine (ATARAX) 25 MG tablet Take 25 mg by mouth every 6 (six) hours as needed for itching. 06/06/22   [provider]  levETIRAcetam (KEPPRA) 500 MG tablet Take 1 tablet (500 mg total) by mouth daily. 11/15/22   Leroy Sea, MD  levothyroxine (SYNTHROID) 75 MCG tablet Take 1 tablet (75 mcg total) by mouth daily before breakfast. 08/14/22 11/01/22  Hughie Closs, MD  lisinopril (ZESTRIL) 40 MG tablet Take 1 tablet (40 mg total) by mouth daily. 11/15/22   Leroy Sea, MD  multivitamin (RENA-VIT) TABS tablet Take 1 tablet by mouth daily.    [provider]  nitroGLYCERIN (NITROSTAT) 0.4 MG SL tablet Place 0.4 mg under the tongue every 5 (five) minutes as needed for chest pain.    [provider]  octreotide (SANDOSTATIN LAR) 20 MG injection Inject 20 mg into the muscle every 28 (twenty-eight) days. 11/15/22   Leroy Sea, MD  omeprazole (PRILOSEC) 40 MG capsule Take 1 capsule (40 mg total) by mouth daily. 10/06/22  Brooke Bonito L, NP  ondansetron (ZOFRAN-ODT) 4 MG disintegrating tablet Take 4 mg by mouth every 8 (eight) hours as needed for nausea or vomiting. 05/05/22   [provider]  oxyCODONE-acetaminophen (PERCOCET/ROXICET) 5-325 MG tablet Take 1 tablet by mouth every 6 (six) hours as needed for severe pain. 11/18/22   Bethann Berkshire, MD  oxyCODONE-acetaminophen (PERCOCET/ROXICET) 5-325 MG tablet Take 1 tablet by mouth every 6 (six) hours as needed for severe pain. 11/18/22   Bethann Berkshire, MD  predniSONE (DELTASONE) 10 MG tablet Take 2 tablets (20 mg total) by mouth daily. 11/18/22   Bethann Berkshire, MD   rosuvastatin (CRESTOR) 10 MG tablet Take 1 tablet by mouth once daily 06/06/22   [place] Ouch, [place] M, PA-C  sevelamer carbonate (RENVELA) 800 MG tablet Take 3,200 mg by mouth in the morning and at bedtime. 12/27/21   [provider]  traMADol (ULTRAM) 50 MG tablet Take 1 tablet (50 mg total) by mouth every 12 (twelve) hours as needed for moderate pain. 11/15/22   Leroy Sea, MD      Allergies    Amlodipine, Aspirin, Nitrofurantoin, Ranexa [ranolazine], Bactrim [sulfamethoxazole-trimethoprim], Contrast media [iodinated contrast media], Iron, Gabapentin, Iron sucrose, Sucroferric oxyhydroxide, Levaquin [levofloxacin in d5w], Pantoprazole, Plavix [clopidogrel bisulfate], Protonix [pantoprazole sodium], and Venofer [ferric oxide]    Review of Systems   Review of Systems  Gastrointestinal:  Positive for diarrhea.  All other systems reviewed and are negative.   Physical Exam Updated Vital Signs BP (!) 156/62   Pulse (!) 54   Temp 98.1 F (36.7 C) (Oral)   Resp 15   SpO2 100%  Physical Exam Vitals and nursing note reviewed. Exam conducted with a chaperone present.  Constitutional:      General: She is not in acute distress.    Appearance: She is not diaphoretic.  HENT:     Head: Normocephalic and atraumatic.     Mouth/Throat:     Pharynx: No oropharyngeal exudate.  Eyes:     General: No scleral icterus.    Conjunctiva/sclera: Conjunctivae normal.  Cardiovascular:     Rate and Rhythm: Normal rate and regular rhythm.     Pulses: Normal pulses.     Heart sounds: Normal heart sounds.  Pulmonary:     Effort: Pulmonary effort is normal. No respiratory distress.     Breath sounds: Normal breath sounds. No wheezing.  Abdominal:     General: Bowel sounds are normal.     Palpations: Abdomen is soft. There is no mass.     Tenderness: There is no abdominal tenderness. There is no guarding or rebound.  Genitourinary:    Rectum: External hemorrhoid (non-thrombosed) present. No  mass, tenderness, anal fissure or internal hemorrhoid.     Comments: RN chaperone Burman Nieves) present for rectal exam.  No appreciable tenderness to palpation noted to rectum.  No appreciable internal hemorrhoid, mass, anal fissure, abnormal anal tone.  No gross blood noted to rectal vault. External non-thrombosed hemorrhoid noted. Stage 1 pressure ulcers noted to sacral region. Musculoskeletal:        General: Normal range of motion.     Cervical back: Normal range of motion and neck supple.  Skin:    General: Skin is warm and dry.  Neurological:     Mental Status: She is alert.  Psychiatric:        Behavior: Behavior normal.     ED Results / Procedures / Treatments   Labs (all labs ordered are listed, but only abnormal  results are displayed) Labs Reviewed  CBC WITH DIFFERENTIAL/PLATELET - Abnormal; Notable for the following components:      Result Value   RBC 1.98 (*)    Hemoglobin 7.1 (*)    HCT 23.0 (*)    MCV 116.2 (*)    MCH 35.9 (*)    RDW 25.7 (*)    Neutro Abs 7.8 (*)    Lymphs Abs 0.6 (*)    All other components within normal limits  COMPREHENSIVE METABOLIC PANEL - Abnormal; Notable for the following components:   Potassium 3.4 (*)    CO2 14 (*)    Glucose, Bld 118 (*)    BUN 59 (*)    Creatinine, Ser 7.84 (*)    Calcium 7.9 (*)    Albumin 3.3 (*)    AST 6 (*)    GFR, Estimated 5 (*)    Anion gap 20 (*)    All other components within normal limits  POC OCCULT BLOOD, ED - Abnormal; Notable for the following components:   Fecal Occult Bld POSITIVE (*)    All other components within normal limits  TYPE AND SCREEN    EKG None  Radiology No results found.  Procedures Procedures    Medications Ordered in ED Medications  pantoprazole (PROTONIX) injection 40 mg (has no administration in time range)    ED Course/ Medical Decision Making/ A&P Clinical Course as of 11/30/22 0347  Wed Nov 30, 2022  0300 Fecal Occult Blood, POC(!): POSITIVE [SB]   8295 Discussed with patient lab findings. Discussed with patient plans for admission. Pt agreeable at this time. Pt appears safe for admission. [SB]  0320 Consult with hospitalist, Dr. Margo Aye who will evaluate for admission. [SB]  0321 Secure chat sent to [facility]. Dr. Tomasa Rand regarding patient case. [SB]    Clinical Course User Index [SB] Dalina Samara A, PA-C                             Medical Decision Making Amount and/or Complexity of Data Reviewed Labs: ordered. Decision-making details documented in ED Course.  Risk Decision regarding hospitalization.   Pt presents with concerns for soft stools onset 4 weeks. Vital signs, pt afebrile. On exam, pt with RN chaperone Burman Nieves) present for rectal exam.  No appreciable tenderness to palpation noted to rectum.  No appreciable internal hemorrhoid, mass, anal fissure, abnormal anal tone.  No gross blood noted to rectal vault. External non-thrombosed hemorrhoid noted. Stage 1 pressure ulcers noted to sacral region. No acute cardiovascular, respiratory, abdominal exam findings. Differential diagnosis includes upper GI bleed, lower GI bleed, external hemorrhoid, internal hemorrhoid, anal fissure.    Co morbidities that complicate the patient evaluation: Recurrent GI bleeds, dialysis (MWF),   Additional history obtained:  External records from outside source obtained and reviewed including: pt was evaluated and admitted to the hospital for symptomatic anemia on 10/28/22  Labs:  I ordered, and personally interpreted labs.  The pertinent results include:   Fecal occult positive Type and screen ordered at time of admission CMP with   Consultations: I requested consultation with the Hospitalist, Dr. Margo Aye and discussed lab and imaging findings as well as pertinent plan - they recommend: will evaluate for admission -Secure chat sent to the  GI, Dr. Tomasa Rand regarding patient case  Disposition: Presenting suspicious for  GI bleed.  Doubt concerns at this time for external thrombosed hemorrhoid, anal fissure, internal hemorrhoid. After consideration of the  diagnostic results and the patients response to treatment, I feel that the patient would benefit from Admission to the hospital.  Discussed with patient plans for admission.  Patient agreeable this time.  Patient appears safe for admission at this time.   This chart was dictated using voice recognition software, Dragon. Despite the best efforts of this provider to proofread and correct errors, errors may still occur which can change documentation meaning.   Final Clinical Impression(s) / ED Diagnoses Final diagnoses:  Diarrhea, unspecified type  Symptomatic anemia    Rx / DC Orders ED Discharge Orders     None         Bralyn Folkert A, PA-C 11/30/22 0347    Palumbo, April, MD 11/30/22 1610

## 2022-11-30 NOTE — H&P (View-Only) (Signed)
Consultation Note   Referring Provider:  [facility] PCP: [facility] Primary Gastroenterologist: Dr. Levon Hedger        Reason for consultation: rectal bleeding, drop in hgb  DOA: 11/30/2022         Hospital Day: 1   Attending physician's note  I have taken a history, reviewed the chart and examined the patient. I performed a substantive portion of this encounter, including complete performance of at least one of the key components, in conjunction with the APP. I agree with the APP's note, impression and recommendations.   82 year old very pleasant female with end-stage renal disease on dialysis admitted with worsening anemia.  Patient gives history of having pieces of blood in stool " looks like raisins"  She has history of gastric ulcer and also jejunal adenomatous polyp.  She will benefit from push enteroscopy for further evaluation  The risks and benefits as well as alternatives of endoscopic procedure(s) have been discussed and reviewed. All questions answered. The patient agrees to proceed.  Monitor hemoglobin and transfuse to >7 N.p.o. after midnight   The patient was provided an opportunity to ask questions and all were answered. The patient agreed with the plan and demonstrated an understanding of the instructions.  Iona Beard , MD [phone]     Assessment and Plan   82 yo female with a very complex medical history as listed in PMH. She has a history of IDA / GI bleeding / small bowel AVMs, PUD. She had a non-bleeding gastric ulcer and an adenomatous jejunal polyp ( not removed) on most recent EGD 4/24. Currently admitted with dark blood in stool with drop in hgb from 9 >>7. -To get hemodialysis today.  - Continue BID IV PPI  - A unit of blood has been ordered.  - Consider repeat EGD or enteroscopy this admission.  - Clear liquids okay from GI standpoint  -  She takes Octreotide injections at  home ( ? For history of AVMs)  Acute on chronic diarrhea. Infectious? Secondary to GI bleeding? If having diarrhea inpatient then will check stool studies given recent antibiotics  Generalized abdominal pain . Etiology unclear.  Recently evaluated by IR for possible mesenteric ischemia but it was not felt to be source of her pain. Continues to have periodic generalized abdominal pain but not meal related.   Remote colon cancer, s/p resection in 1990's / history of adenomatous colon polyps on last colonoscopy in Aug 2020, including a large transverse colon polyp.   Jejunal tubular adenoma ( left (intact and area tattooed on enteroscopy 11/02/22)  ESRD on HD Nephrology to see, will dialyze today   History of Present Illness Patient is a 82 y.o. year old female whose past medical history includes but is not necessarily limited to chronic diarrhea, colon polyps, GERD, iron deficiency anemia, colon cancer s/p resection in 1992, small bowel AVMs, duodenal adenoma, PUD, A-fib on ASA, CAD s/p stenting, ESRD, dementia, ovarian cancer, TIA  Patient has had previous admissions for anemia / GI bleeding. Followed by [facility]. Last office note from 10/06/22 gives an extensive outline of previous GI procedures which I will transport to this note.   Since above last office visit patient was hospitalized again and  seen by Gi on 10/29/22 for IDA. Inpatient enteroscopy remarkable for a non-bleeding gastric ulcer ( see full report below) and a 10 cm jejunal polyp which was biopsied and proved to be a tubular adenoma.  Gastric ulcer biopsies >> chronic inactive gastritis without H.pylori. She did receive a unit of PRBCs on 4/20 with improvement in hgb into 9-10 range.   On 5/16 she was seen outpatient by IR for abdominal pain and concern for mesenteric ischemia. However, her abdominal pain  resolved. There was no definitive evidence of a hemodynamically significant narrowing affecting at least 2 of the 3 major  mesenteric vessels.  It appeared her symptoms may be attributable to a enteritis and unlikely to be associated with an ischemic etiology.   Patient currently admitted diarrhea with blood. She has a history of CHRONIC diarrhea but it got worse a few weeks ago. She did recently have antibiotics. She describes dark, nearly black blood in stool last night and again this am. Denies any red blood.  Stool FOBT +. No N/V. She takes a daily baby asa , no other NSAIDs. Also on omeprazole. Getting transfused 1 u PRBCs Hgb 7.1  Kyndel had severe generalized abdominal pain following BM this am. Pain has since resolved.    Labs and Imaging: Recent Labs    11/30/22 0130  WBC 9.5  HGB 7.1*  HCT 23.0*  PLT 261   Recent Labs    11/30/22 0130  NA 135  K 3.4*  CL 101  CO2 14*  GLUCOSE 118*  BUN 59*  CREATININE 7.84*  CALCIUM 7.9*   Recent Labs    11/30/22 0130  PROT 7.1  ALBUMIN 3.3*  AST 6*  ALT 11  ALKPHOS 62  BILITOT 0.7   No results for input(s): "HEPBSAG", "HCVAB", "HEPAIGM", "HEPBIGM" in the last 72 hours. No results for input(s): "LABPROT", "INR" in the last 72 hours.  Previous GI Evaluation:   Per [facility] office note 10/06/22 07/2016 EGD for anemia: -prior duodenal AVMs, recent NSTEMI and pending cardiac cath.  2 cm hiatal hernia.  Normal stomach and esophagus.  Solitary duodenal versus jejunal polyp was not removed.  Nonbleeding jejunal AVM treated with APC and may have been source for anemia.   02/2019 colonoscopy.  For evaluation gastrointestinal bleeding: -Pandiverticulosis.  Patent ileocolonic anastomosis.  Removed 2 small polyps (TAs) from ascending colon.  Piecemeal resection using a lift technique of large polyp (TA) in transverse colon.   09/2019 EGD for evaluation duodenal polyp: Esophagus, Z-line normal.  3 cm hiatal hernia.  Normal stomach and duodenal bulb.  Solitary, diminutive, sessile polyp at D2 was biopsied (adenoma wo HGD).  Exam completed to third  duodenum.   02/05/2021 EGD for melena, blood loss anemia: -Showed mild severity esophageal candidiasis without bleeding.  Gastritis, nonbleeding.  Normal examined duodenum to D2.  Treated with Diflucan for 14 days and continue daily oral PPI.   04/22/2021 VCE: -Successful, complete study.  Small, flat polyp in either second or third portion duodenum with no stigmata of bleeding.  2 polyps at distal small bowel, 1 small size the other polyp medium.  No bleeding stigmata in either of these polyps.  Dr Karilyn Cota rec push enteroscopy.     06/08/2021 colonoscopy, enteroscopy were cxld due to IV infiltration RUE.  LUE not amenable to IV as her fistula is in that arm. Plans for outpt PICC line and proceed with GI procedures in January 2022, but procedures never happened. Meds include low-dose aspirin.  Prilosec  20 mg/day, Zofran prn.  Folic acid. Mircera, last dose 1/22.    08/11/2022 EGD: -2 cm hiatal hernia, normal esophagus otherwise, duodenal AVM status post APC, 5 mm duodenal polyp removed (duodenal adenoma)   08/11/2022 colonoscopy: -end-to-end ileocolonic anastomosis ascending colon, end-to-side colocolonic anastomosis rectosigmoid colon, normal neoterminal ileum, 6 polyps removed in the transverse, descending, sigmoid colons.  Tubular adenomas on pathology   EGD 08/27/2022: -multiple gastric AVMs treated with APC, small hiatal hernia, no active or stigmata of bleeding. VCE placed   VCE 08/27/22 read 08/28/22 -A few small AVMs in the small bowel, likely jejunum.  2 small polyps in the distal small bowel previously noted.  No active or stigmata of bleeding.  Capsule appears to sit at the ileocolonic anastomosis for the last 2 to 3 hours of study.  Enteroscopy 11/01/22 - 2 cm hiatal hernia.  - Non-bleeding gastric ulcer with a clean ulcer base (Forrest Class III).  Biopsied.  - Normal examined duodenum.  - Jejunal polyp(s).  Biopsied.  Tattooed.  - Non-bleeding jejunal diverticula.    A. STOMACH,  BIOPSY:  -  Antral mucosa with area of erosion as well as histiocytosis  suggestive of repair in the background of a mild to moderate chronic  inactive gastritis.  -  Oxyntic mucosa with mild chronic inactive gastritis.  -  An immunohistochemical stain for Helicobacter pylori organisms is negative.   Note: Immunohistochemical stains for CD68 highlight histiocytes and  keratin cocktail highlight the epithelium, negative for malignancy.   B. JEJUNUM, BIOPSY:  -  Tubular adenoma.   Past Medical History:  Diagnosis Date   Acute on chronic respiratory failure with hypoxia (HCC) 10/10/2016   Anxiety    Arthritis    AVM (arteriovenous malformation) of colon    CAD (coronary artery disease)    a. s/p CABG in 2013 b. DES to D1 in 10/2016. c. cath in 07/2018 showing patent grafts with occlusion of D1 at prior stent site and progression of PDA disease --> medical management recommended   Carotid artery disease (HCC)    a. 60-79% LICA, 03/2012    Chronic bronchitis (HCC)    Chronic HFrEF (heart failure with reduced ejection fraction) (HCC)    Colon cancer (HCC) 1992   Esophageal stricture    ESRD on hemodialysis (HCC)    ESRD due to HTN, started dialysis 2011 and gets HD at [facility] with Dr Fausto Skillern on MWF schedule.  Access is LUA AVF as of Sept 2014.    GERD (gastroesophageal reflux disease)    High cholesterol 12/2011   History of blood transfusion 07/2011; 12/2011; 01/2012 X 2; 04/2012   History of gout    History of lower GI bleeding    Hypertension    Iron deficiency anemia    Jugular vein occlusion, right (HCC)    Mitral regurgitation    a. Moderate by echo, 02/2012   Mitral valve disease    NSVT (nonsustained ventricular tachycardia) (HCC)    Ovarian cancer (HCC) 1992   PAF (paroxysmal atrial fibrillation) (HCC)    Pneumonia ~ 2009   PUD (peptic ulcer disease)    TIA (transient ischemic attack)    Tricuspid valve disease     Past Surgical History:  Procedure Laterality Date    A/V FISTULAGRAM Left 10/04/2022   Procedure: A/V Fistulagram;  Surgeon: Renford Dills, MD;  Location: [facility] INVASIVE CV LAB;  Service: Cardiovascular;  Laterality: Left;   A/V SHUNTOGRAM Left 03/19/2019   Procedure: A/V SHUNTOGRAM;  Surgeon: Renford Dills, MD;  Location: [facility] INVASIVE CV LAB;  Service: Cardiovascular;  Laterality: Left;   ABDOMINAL HYSTERECTOMY  1992   APPENDECTOMY  06/1990   AV FISTULA PLACEMENT  07/2009   left upper arm   AV FISTULA PLACEMENT Right 09/06/2016   Procedure: RIGHT FOREARM ARTERIOVENOUS (AV) GRAFT;  Surgeon: Sherren Kerns, MD;  [facility]: [facility] OR;  Service: Vascular;  Laterality: Right;   AV FISTULA PLACEMENT N/A 02/24/2017   Procedure: INSERTION OF ARTERIOVENOUS (AV) GORE-TEX GRAFT ARM (BRACHIAL AXILLARY);  Surgeon: Renford Dills, MD;  Location: [facility] ORS;  Service: Vascular;  Laterality: N/A;   AVGG REMOVAL Right 09/06/2016   Procedure: REMOVAL OF Right Arm ARTERIOVENOUS GORETEX GRAFT and Vein Patch angioplasty of brachial artery;  Surgeon: Chuck Hint, MD;  Location: [facility] OR;  Service: Vascular;  Laterality: Right;   BIOPSY  09/26/2019   Procedure: BIOPSY;  Surgeon: Malissa Hippo, MD;  [facility];  Service: Endoscopy;;   BIOPSY  11/01/2022   Procedure: BIOPSY;  Surgeon: Dolores Frame, MD;  [facility];  Service: Gastroenterology;;   COLON RESECTION  1992   COLON SURGERY     COLONOSCOPY N/A 03/09/2019   Procedure: COLONOSCOPY;  Surgeon: Malissa Hippo, MD;  [facility];  Service: Endoscopy;  Laterality: N/A;   COLONOSCOPY N/A 08/11/2022   Procedure: COLONOSCOPY;  Surgeon: Beverley Fiedler, MD;  Location: [facility] ENDOSCOPY;  Service: Gastroenterology;  Laterality: N/A;   COLONOSCOPY WITH PROPOFOL N/A 06/08/2021   Procedure: COLONOSCOPY WITH PROPOFOL;  Surgeon: Dolores Frame, MD;  [facility];  Service: Gastroenterology;  Laterality: N/A;  9:05 /Patient is on dialysis Mon Wed Fri    CORONARY ANGIOPLASTY WITH STENT PLACEMENT  12/15/11   "2"   CORONARY ANGIOPLASTY WITH STENT PLACEMENT  y/2013   "1; makes total of 3" (05/02/2012)   CORONARY ARTERY BYPASS GRAFT  06/13/2012   Procedure: CORONARY ARTERY BYPASS GRAFTING (CABG);  Surgeon: Delight Ovens, MD;  Location: [facility] OR;  Service: Open Heart Surgery;  Laterality: N/A;  cabg x four;  using left internal mammary artery, and left leg greater saphenous vein harvested endoscopically   CORONARY STENT INTERVENTION N/A 10/13/2016   Procedure: Coronary Stent Intervention;  Surgeon: Lennette Bihari, MD;  [facility] INVAS[facility] CV LAB;  Service: Cardiovascular;  Laterality: N/A;   DIALYSIS/PERMA CATHETER REMOVAL N/A 04/18/2017   Procedure: DIALYSIS/PERMA CATHETER REMOVAL;  Surgeon: Renford Dills, MD;  Location: [facility] INVASIVE CV LAB;  Service: Cardiovascular;  Laterality: N/A;   DILATION AND CURETTAGE OF UTERUS     ENTEROSCOPY N/A 06/08/2021   Procedure: PUSH ENTEROSCOPY;  Surgeon: Dolores Frame, MD;  [facility];  Service: Gastroenterology;  Laterality: N/A;   ENTEROSCOPY N/A 11/01/2022   Procedure: ENTEROSCOPY;  Surgeon: Dolores Frame, MD;  [facility];  Service: Gastroenterology;  Laterality: N/A;   ESOPHAGOGASTRODUODENOSCOPY  01/20/2012   Procedure: ESOPHAGOGASTRODUODENOSCOPY (EGD);  Surgeon: Meryl Dare, MD,FACG;  Location: [facility] ENDOSCOPY;  Service: Endoscopy;  Laterality: N/A;   ESOPHAGOGASTRODUODENOSCOPY N/A 03/26/2013   Procedure: ESOPHAGOGASTRODUODENOSCOPY (EGD);  Surgeon: Hilarie Fredrickson, MD;  Location: [facility] ENDOSCOPY;  Service: Endoscopy;  Laterality: N/A;   ESOPHAGOGASTRODUODENOSCOPY N/A 04/30/2015   Procedure: ESOPHAGOGASTRODUODENOSCOPY (EGD);  Surgeon: Malissa Hippo, MD;  [facility];  Service: Endoscopy;  Laterality: N/A;  1pm - moved to 10/20 @ 1:10   ESOPHAGOGASTRODUODENOSCOPY N/A 07/29/2016   Procedure: ESOPHAGOGASTRODUODENOSCOPY (EGD);  Surgeon: Reeves Forth  Adela Lank, MD;  Location: [facility] ENDOSCOPY;  Service: Gastroenterology;  Laterality: N/A;  enteroscopy   ESOPHAGOGASTRODUODENOSCOPY N/A 09/26/2019   Procedure: ESOPHAGOGASTRODUODENOSCOPY (EGD);  Surgeon: Malissa Hippo, MD;  [facility];  Service: Endoscopy;  Laterality: N/A;  1250   ESOPHAGOGASTRODUODENOSCOPY N/A 08/11/2022   Procedure: ESOPHAGOGASTRODUODENOSCOPY (EGD);  Surgeon: Beverley Fiedler, MD;  Location: [facility] ENDOSCOPY;  Service: Gastroenterology;  Laterality: N/A;   ESOPHAGOGASTRODUODENOSCOPY (EGD) WITH PROPOFOL N/A 02/05/2021   Procedure: ESOPHAGOGASTRODUODENOSCOPY (EGD) WITH PROPOFOL;  Surgeon: Lanelle Bal, DO;  [facility];  Service: Endoscopy;  Laterality: N/A;   ESOPHAGOGASTRODUODENOSCOPY (EGD) WITH PROPOFOL N/A 08/27/2022   Procedure: ESOPHAGOGASTRODUODENOSCOPY (EGD) WITH PROPOFOL;  Surgeon: Lanelle Bal, DO;  [facility];  Service: Endoscopy;  Laterality: N/A;   GIVENS CAPSULE STUDY N/A 03/07/2019   Procedure: GIVENS CAPSULE STUDY;  Surgeon: Malissa Hippo, MD;  [facility];  Service: Endoscopy;  Laterality: N/A;  7:30   GIVENS CAPSULE STUDY N/A 04/22/2021   Procedure: GIVENS CAPSULE STUDY;  Surgeon: Malissa Hippo, MD;  [facility];  Service: Endoscopy;  Laterality: N/A;  7:30   GIVENS CAPSULE STUDY N/A 08/27/2022   Procedure: GIVENS CAPSULE STUDY;  Surgeon: Lanelle Bal, DO;  [facility];  Service: Endoscopy;  Laterality: N/A;   HOT HEMOSTASIS N/A 08/11/2022   Procedure: HOT HEMOSTASIS (ARGON PLASMA COAGULATION/BICAP);  Surgeon: Beverley Fiedler, MD;  Location: [facility] ENDOSCOPY;  Service: Gastroenterology;  Laterality: N/A;   INSERTION OF DIALYSIS CATHETER N/A 10/05/2020   Procedure: ABORTED TUNNELED DIALYSIS CATHETER PLACEMENT RIGHT INTERNAL JUGULAR VEIN ;  Surgeon: Lucretia Roers, MD;  Location: [facility]S;  Service: General;  Laterality: N/A;   INTRAOPERATIVE TRANSESOPHAGEAL ECHOCARDIOGRAM  06/13/2012    Procedure: INTRAOPERATIVE TRANSESOPHAGEAL ECHOCARDIOGRAM;  Surgeon: Delight Ovens, MD;  Location: [facility] OR;  Service: Open Heart Surgery;  Laterality: N/A;   IR DIALY SHUNT INTRO NEEDLE/INTRACATH INITIAL W/IMG LEFT Left 10/06/2020   IR FLUORO GUIDE CV LINE RIGHT  06/17/2020   IR FLUORO GUIDE CV LINE RIGHT  11/12/2022   IR GENERIC HISTORICAL  07/26/2016   IR FLUORO GUIDE CV LINE RIGHT 07/26/2016 Berdine Dance, MD [facility]   IR GENERIC HISTORICAL  07/26/2016   IR US GUIDE VASC ACCESS RIGHT 07/26/2016 Berdine Dance, MD [facility]   IR GENERIC HISTORICAL  08/02/2016   IR US GUIDE VASC ACCESS RIGHT 08/02/2016 Berdine Dance, MD [facility]   IR GENERIC HISTORICAL  08/02/2016   IR FLUORO GUIDE CV LINE RIGHT 08/02/2016 Berdine Dance, MD [facility]   IR RADIOLOGY PERIPHERAL GUIDED IV START  03/28/2017   IR REMOVAL TUN CV CATH W/O FL  08/11/2020   IR REMOVAL TUN CV CATH W/O FL  11/15/2022   IR THROMBECTOMY AV FISTULA W/THROMBOLYSIS INC/SHUNT/IMG LEFT Left 06/17/2020   IR US GUIDE VASC ACCESS LEFT  06/17/2020   IR US GUIDE VASC ACCESS RIGHT  03/28/2017   IR US GUIDE VASC ACCESS RIGHT  06/17/2020   IR US GUIDE VASC ACCESS RIGHT  11/12/2022   LEFT HEART CATH AND CORONARY ANGIOGRAPHY N/A 09/20/2016   Procedure: Left Heart Cath and Coronary Angiography;  Surgeon: Lyn Records, MD;  Location: [facility] INVASIVE CV LAB;  Service: Cardiovascular;  Laterality: N/A;   LEFT HEART CATH AND CORS/GRAFTS ANGIOGRAPHY N/A 10/13/2016   Procedure: Left Heart Cath and Cors/Grafts Angiography;  Surgeon: Lennette Bihari, MD;  Location: [facility] INVAS[facility] CV LAB;  Service: Cardiovascular;  Laterality: N/A;  LEFT HEART CATH AND CORS/GRAFTS ANGIOGRAPHY N/A 07/13/2018   Procedure: LEFT HEART CATH AND CORS/GRAFTS ANGIOGRAPHY;  Surgeon: Swaziland, Peter M, MD;  Location: [facility] INVASIVE CV LAB;  Service: Cardiovascular;  Laterality: N/A;   LEFT HEART CATH AND CORS/GRAFTS ANGIOGRAPHY N/A 07/22/2021   Procedure: LEFT HEART CATH AND CORS/GRAFTS ANGIOGRAPHY;   Surgeon: Runell Gess, MD;  Location: [facility] INVASIVE CV LAB;  Service: Cardiovascular;  Laterality: N/A;   LEFT HEART CATHETERIZATION WITH CORONARY ANGIOGRAM N/A 12/15/2011   Procedure: LEFT HEART CATHETERIZATION WITH CORONARY ANGIOGRAM;  Surgeon: Kathleene Hazel, MD;  Location: [facility] CATH LAB;  Service: Cardiovascular;  Laterality: N/A;   LEFT HEART CATHETERIZATION WITH CORONARY ANGIOGRAM N/A 01/10/2012   Procedure: LEFT HEART CATHETERIZATION WITH CORONARY ANGIOGRAM;  Surgeon: Peter M Swaziland, MD;  Location: [facility] CATH LAB;  Service: Cardiovascular;  Laterality: N/A;   LEFT HEART CATHETERIZATION WITH CORONARY ANGIOGRAM N/A 06/08/2012   Procedure: LEFT HEART CATHETERIZATION WITH CORONARY ANGIOGRAM;  Surgeon: Kathleene Hazel, MD;  Location: [facility] CATH LAB;  Service: Cardiovascular;  Laterality: N/A;   LEFT HEART CATHETERIZATION WITH CORONARY/GRAFT ANGIOGRAM N/A 12/10/2013   Procedure: LEFT HEART CATHETERIZATION WITH Isabel Caprice;  Surgeon: Corky Crafts, MD;  Location: [facility] CATH LAB;  Service: Cardiovascular;  Laterality: N/A;   OVARY SURGERY     ovarian cancer   POLYPECTOMY  03/09/2019   Procedure: POLYPECTOMY;  Surgeon: Malissa Hippo, MD;  [facility];  Service: Endoscopy;;  cecal    POLYPECTOMY N/A 09/26/2019   Procedure: DUODENAL POLYPECTOMY;  Surgeon: Malissa Hippo, MD;  [facility];  Service: Endoscopy;  Laterality: N/A;   POLYPECTOMY  08/11/2022   Procedure: POLYPECTOMY;  Surgeon: Beverley Fiedler, MD;  Location: [facility] ENDOSCOPY;  Service: Gastroenterology;;   REVISION OF ARTERIOVENOUS GORETEX GRAFT N/A 02/24/2017   Procedure: REVISION OF ARTERIOVENOUS GORETEX GRAFT (RESECTION);  Surgeon: Renford Dills, MD;  Location: [facility] ORS;  Service: Vascular;  Laterality: N/A;   REVISON OF ARTERIOVENOUS FISTULA Left 06/19/2020   Procedure: REVISION OF LEFT UPPER ARM AV GRAFT WITH INTERPOSITION JUMP GRAFT USING GORE LIMB;  Surgeon: Cephus Shelling, MD;   Location: [facility] OR;  Service: Vascular;  Laterality: Left;   SHUNTOGRAM N/A 10/15/2013   Procedure: Fistulogram;  Surgeon: Nada Libman, MD;  Location: [facility] CATH LAB;  Service: Cardiovascular;  Laterality: N/A;   SUBMUCOSAL TATTOO INJECTION  11/01/2022   Procedure: SUBMUCOSAL TATTOO INJECTION;  Surgeon: Dolores Frame, MD;  [facility];  Service: Gastroenterology;;   THROMBECTOMY / ARTERIOVENOUS GRAFT REVISION  2011   left upper arm   TUBAL LIGATION  1980's   UPPER EXTREMITY ANGIOGRAPHY Bilateral 12/06/2016   Procedure: Upper Extremity Angiography;  Surgeon: Renford Dills, MD;  Location: [facility] INVASIVE CV LAB;  Service: Cardiovascular;  Laterality: Bilateral;   UPPER EXTREMITY INTERVENTION Left 06/06/2017   Procedure: UPPER EXTREMITY INTERVENTION;  Surgeon: Renford Dills, MD;  Location: [facility] INVASIVE CV LAB;  Service: Cardiovascular;  Laterality: Left;   UPPER EXTREMITY VENOGRAPHY Left 10/04/2022   Procedure: UPPER EXTREMITY VENOGRAPHY;  Surgeon: Renford Dills, MD;  Location: [facility] INVASIVE CV LAB;  Service: Cardiovascular;  Laterality: Left;    Family History  Problem Relation Age of Onset   Heart disease Mother        Heart Disease before age 33   Hyperlipidemia Mother    Hypertension Mother    Diabetes Mother    Heart attack Mother    Heart disease Father  Heart Disease before age 26   Hyperlipidemia Father    Hypertension Father    Diabetes Father    Diabetes Sister    Hypertension Sister    Diabetes Brother    Hyperlipidemia Brother    Heart attack Brother    Hypertension Sister    Heart attack Brother    Colon cancer Child 38   Other Other        noncontributory for early CAD   Esophageal cancer Neg Hx    Liver disease Neg Hx    Kidney disease Neg Hx    Colon polyps Neg Hx     Prior to Admission medications   Medication Sig Start Date End Date Taking? Authorizing Provider  ALPRAZolam Prudy Feeler) 0.5 MG tablet Take 1 tablet (0.5 mg  total) by mouth daily as needed for anxiety. 11/15/22   Leroy Sea, MD  amiodarone (PACERONE) 100 MG tablet Take 1 tablet (100 mg total) by mouth daily. 04/27/22   Rhetta Mura, MD  amoxicillin-clavulanate (AUGMENTIN) 500-125 MG tablet Take 1 tablet by mouth 2 (two) times daily. 11/15/22   Leroy Sea, MD  Aspirin 81 MG CAPS Take 1 capsule by mouth daily at 12 noon.    [provider]  atorvastatin (LIPITOR) 10 MG tablet Take 1 tablet by mouth at bedtime.    [provider]  benzonatate (TESSALON) 100 MG capsule Take 100 mg by mouth 3 (three) times daily as needed for cough. 05/10/22   [provider]  carvedilol (COREG) 3.125 MG tablet Take 1 tablet (3.125 mg total) by mouth 2 (two) times daily with a meal. 11/15/22   Leroy Sea, MD  cyclobenzaprine (FLEXERIL) 5 MG tablet Take 5 mg by mouth 3 (three) times daily as needed.    [provider]  folic acid (FOLVITE) 1 MG tablet Take 1 mg by mouth daily. 05/24/22   [provider]  gabapentin (NEURONTIN) 100 MG capsule TAKE 1 CAPSULE BY MOUTH IN THE EVENING    [provider]  guaiFENesin-codeine 100-10 MG/5ML syrup Take 5 mLs by mouth every 4 (four) hours as needed for cough. 10/23/22   Jacalyn Lefevre, MD  hydrALAZINE (APRESOLINE) 100 MG tablet Take 1 tablet (100 mg total) by mouth every 8 (eight) hours. 11/15/22   Leroy Sea, MD  hydrOXYzine (ATARAX) 25 MG tablet Take 25 mg by mouth every 6 (six) hours as needed for itching. 06/06/22   [provider]  levETIRAcetam (KEPPRA) 500 MG tablet Take 1 tablet (500 mg total) by mouth daily. 11/15/22   Leroy Sea, MD  levothyroxine (SYNTHROID) 75 MCG tablet Take 1 tablet (75 mcg total) by mouth daily before breakfast. 08/14/22 11/01/22  Hughie Closs, MD  lisinopril (ZESTRIL) 40 MG tablet Take 1 tablet (40 mg total) by mouth daily. 11/15/22   Leroy Sea, MD  multivitamin (RENA-VIT) TABS tablet Take 1 tablet by  mouth daily.    [provider]  nitroGLYCERIN (NITROSTAT) 0.4 MG SL tablet Place 0.4 mg under the tongue every 5 (five) minutes as needed for chest pain.    [provider]  octreotide (SANDOSTATIN LAR) 20 MG injection Inject 20 mg into the muscle every 28 (twenty-eight) days. 11/15/22   Leroy Sea, MD  omeprazole (PRILOSEC) 40 MG capsule Take 1 capsule (40 mg total) by mouth daily. 10/06/22   Aida Raider, NP  ondansetron (ZOFRAN-ODT) 4 MG disintegrating tablet Take 4 mg by mouth every 8 (eight) hours as needed for  nausea or vomiting. 05/05/22   [provider]  oxyCODONE-acetaminophen (PERCOCET/ROXICET) 5-325 MG tablet Take 1 tablet by mouth every 6 (six) hours as needed for severe pain. 11/18/22   Bethann Berkshire, MD  oxyCODONE-acetaminophen (PERCOCET/ROXICET) 5-325 MG tablet Take 1 tablet by mouth every 6 (six) hours as needed for severe pain. 11/18/22   Bethann Berkshire, MD  predniSONE (DELTASONE) 10 MG tablet Take 2 tablets (20 mg total) by mouth daily. 11/18/22   Bethann Berkshire, MD  rosuvastatin (CRESTOR) 10 MG tablet Take 1 tablet by mouth once daily 06/06/22   Iran Ouch, [place] M, PA-C  sevelamer carbonate (RENVELA) 800 MG tablet Take 3,200 mg by mouth in the morning and at bedtime. 12/27/21   [provider]  traMADol (ULTRAM) 50 MG tablet Take 1 tablet (50 mg total) by mouth every 12 (twelve) hours as needed for moderate pain. 11/15/22   Leroy Sea, MD    Current Facility-Administered Medications  Medication Dose Route Frequency Provider Last Rate Last Admin   acetaminophen (TYLENOL) tablet 650 mg  650 mg Oral Q6H PRN Dow Adolph N, DO       atorvastatin (LIPITOR) tablet 10 mg  10 mg Oral QHS Hall, Carole N, DO       Chlorhexidine Gluconate Cloth 2 % PADS 6 each  6 each Topical Q0600 Zetta Bills, MD       folic acid (FOLVITE) tablet 1 mg  1 mg Oral Daily Hall, Carole N, DO       gabapentin (NEURONTIN) capsule 100 mg  100 mg Oral QHS Hall,  Carole N, DO       hydrOXYzine (ATARAX) tablet 25 mg  25 mg Oral Q6H PRN Dow Adolph N, DO       levETIRAcetam (KEPPRA) tablet 500 mg  500 mg Oral Daily Hall, Carole N, DO       levothyroxine (SYNTHROID) tablet 75 mcg  75 mcg Oral QAC breakfast Hall, Carole N, DO       pantoprazole (PROTONIX) injection 40 mg  40 mg Intravenous Q12H Dow Adolph N, DO   40 mg at 11/30/22 1610   prochlorperazine (COMPAZINE) injection 5 mg  5 mg Intravenous Q6H PRN Dow Adolph N, DO       sevelamer carbonate (RENVELA) tablet 3,200 mg  3,200 mg Oral TID WC Hall, Carole N, DO        Allergies as of 11/30/2022 - Reviewed 11/30/2022  Allergen Reaction Noted   Amlodipine Swelling 01/19/2015   Aspirin Other (See Comments) 12/15/2011   Nitrofurantoin Hives 07/08/2009   Ranexa [ranolazine] Other (See Comments) 08/30/2020   Bactrim [sulfamethoxazole-trimethoprim] Rash 12/15/2011   Contrast media [iodinated contrast media] Itching 12/15/2011   Iron Itching and Other (See Comments) 05/02/2012   Gabapentin Other (See Comments) 02/08/2017   Iron sucrose Other (See Comments) 04/15/2021   Sucroferric oxyhydroxide Other (See Comments) 04/15/2021   Levaquin [levofloxacin in d5w] Rash 10/04/2013   Pantoprazole Rash 03/21/2013   Plavix [clopidogrel bisulfate] Rash 01/05/2012   Protonix [pantoprazole sodium] Rash 03/21/2013   Venofer [ferric oxide] Itching and Other (See Comments) 05/02/2012    Social History   Socioeconomic History   Marital status: Married    Spouse name: Olivya Jim   Number of children: 1   Years of education: Not on file   Highest education level: GED or equivalent  Occupational History   Occupation: retired- Architectural technologist- sewed  Tobacco Use   Smoking status: Never   Smokeless tobacco: Never  Vaping Use  Vaping Use: Never used  Substance and Sexual Activity   Alcohol use: No    Alcohol/week: 0.0 standard drinks of alcohol   Drug use: No   Sexual activity: Not  Currently    Birth control/protection: Surgical  Other Topics Concern   Not on file  Social History Narrative   Lives in [place], [place] with husband.  Dialysis pt - [facility].   Social Determinants of Health   Financial Resource Strain: Low Risk  (12/14/2018)   Overall Financial Resource Strain (CARDIA)    Difficulty of Paying Living Expenses: Not very hard  Food Insecurity: No Food Insecurity (10/29/2022)   Hunger Vital Sign    Worried About Running Out of Food in the Last Year: Never true    Ran Out of Food in the Last Year: Never true  Transportation Needs: No Transportation Needs (10/29/2022)   PRAPARE - Administrator, Civil Service (Medical): No    Lack of Transportation (Non-Medical): No  Physical Activity: Unknown (12/14/2018)   Exercise Vital Sign    Days of Exercise per Week: Patient declined    Minutes of Exercise per Session: Patient declined  Stress: No Stress Concern Present (12/14/2018)   [employer] of Occupational Health - Occupational Stress Questionnaire    Feeling of Stress : Only a little  Social Connections: Unknown (12/14/2018)   Social Connection and Isolation Panel [NHANES]    Frequency of Communication with Friends and Family: Patient declined    Frequency of Social Gatherings with Friends and Family: Patient declined    Attends Religious Services: Patient declined    Database administrator or Organizations: Patient declined    Attends Banker Meetings: Patient declined    Marital Status: Patient declined  Intimate Partner Violence: Not At Risk (10/29/2022)   Humiliation, Afraid, Rape, and Kick questionnaire    Fear of Current or Ex-Partner: No    Emotionally Abused: No    Physically Abused: No    Sexually Abused: No     Code Status   Code Status: Full Code  Review of Systems: All systems reviewed and negative except where noted in HPI.  Physical Exam: Vital signs in last 24 hours: Temp:  [97.6 F (36.4 C)-98.1 F (36.7 C)] 97.6  F (36.4 C) (05/22 0629) Pulse Rate:  [35-65] 62 (05/22 0629) Resp:  [12-24] 17 (05/22 0629) BP: (149-177)/(45-71) 177/65 (05/22 0629) SpO2:  [95 %-100 %] 99 % (05/22 0629) Last BM Date : 11/30/22  General:  Pleasant female in NAD Psych:  Cooperative. Normal mood and affect Eyes: Pupils equal Ears:  Normal auditory acuity Nose: No deformity, discharge or lesions Neck:  Supple, no masses felt Lungs:  Clear to auscultation.  Heart:  Regular rate, regular rhythm.  Abdomen:  Soft, nondistended, nontender, active bowel sounds, no masses felt Rectal :  Deferred Msk: Symmetrical without gross deformities.  Neurologic:  Alert, oriented, grossly normal neurologically Extremities : No edema Skin:  Intact without significant lesions.    Intake/Output from previous day: No intake/output data recorded. Intake/Output this shift:  No intake/output data recorded.     Willette Cluster, NP-C @  11/30/2022, 9:51 AM

## 2022-11-30 NOTE — TOC Initial Note (Signed)
Transition of Care [facility]) - Initial/Assessment Note    Patient Details  Name: Shawna [name] MRN: [id number] Date of Birth: 06-10-1941  Transition [facility]) CM/SW Contact:    Tom-Johnson, Hershal Coria, RN Phone Number: 11/30/2022, 11:10 AM  Clinical Narrative:                  CM spoke with patient and husband, [name] at bedside about needs for post hospital transition. Admitted for Rectal Bleed, hgb on admit was 7.1. 1 U PRBC ordered.  Patient has hx or recurrent bleed, GI following.   From home with husband, has one living supportive daughter. Patient goes to outpatient dialysis on a MWF schedule at [facility] in [place]. Husband transports to and from appointments. Has a cane, walker, rollator, wheelchair, shower seat and grab bars at home.  Has a PCP in [place] and uses [facility] in [place].  Husband requesting patient go to rehab at discharge, stating patient has not been able to walk since last admit. Awaiting PT/OT eval for disposition.     CM will continue to follow as patient progresses with care towards discharge.           Barriers to Discharge: Continued Medical Work up   Patient Goals and CMS Choice Patient states their goals for this hospitalization and ongoing recovery are:: Husband requests rehab then return home. CMS Medicare.gov Compare Post Acute Care list provided to:: Patient Choice offered to / list presented to : Patient, Spouse      Expected Discharge Plan and Services       Living arrangements for the past 2 months: Single Family Home                                      Prior Living Arrangements/Services Living arrangements for the past 2 months: Single Family Home Lives with:: Spouse Patient language and need for interpreter reviewed:: Yes Do you feel safe going back to the place where you live?: Yes      Need for Family Participation in Patient Care: Yes (Comment) Care giver support system in place?: Yes  (comment) Current home services: DME (Cane, walker, rollator, shower seat, grab bars, wheelchair.) Criminal Activity/Legal Involvement Pertinent to Current Situation/Hospitalization: No - Comment as needed  Activities of Daily Living      Permission Sought/Granted Permission sought to share information with : Case Manager, Family Supports Permission granted to share information with : Yes, Verbal Permission Granted              Emotional Assessment Appearance:: Appears stated age Attitude/Demeanor/Rapport: Engaged, Gracious Affect (typically observed): Accepting, Appropriate, Calm, Hopeful, Pleasant Orientation: : Oriented to Self, Oriented to Place Alcohol / Substance Use: Not Applicable Psych Involvement: No (comment)  Admission diagnosis:  Rectal bleeding [K62.5] Symptomatic anemia [D64.9] Diarrhea, unspecified type [R19.7] Patient Active Problem List   Diagnosis Date Noted   Rectal bleeding 11/30/2022   Generalized abdominal pain 11/10/2022   Ileus (HCC) 11/04/2022   Periumbilical abdominal pain 11/04/2022   Symptomatic anemia 10/29/2022   Anemia of chronic disease 10/29/2022   GI bleed 10/29/2022   Iron deficiency anemia due to chronic blood loss 08/26/2022   Benign neoplasm of transverse colon 08/11/2022   Benign neoplasm of descending colon 08/11/2022   Benign neoplasm of sigmoid colon 08/11/2022   Angiodysplasia of duodenum 08/11/2022   Delirium 04/22/2022   History of arteriovenous malformation (AVM)  04/22/2022   Acute metabolic encephalopathy 11/09/2021   NSTEMI, initial episode of care (HCC) 10/19/2021   Acute on chronic blood loss anemia 09/09/2021   Ischemic cardiomyopathy    Atrial fibrillation, chronic (HCC) 05/07/2021   Neuropathy 04/23/2021   Anemia associated with chronic renal failure 04/13/2021   Left knee pain 03/15/2021   Moderate protein-calorie malnutrition (HCC) 02/27/2021   COVID-19 virus infection 02/03/2021   Leukocytosis 02/03/2021    Acquired hypothyroidism 02/03/2021   Myositis 12/03/2020   Ataxia 12/02/2020   Diabetes mellitus type 2 in nonobese (HCC) 11/10/2020   Failure to thrive in adult 11/09/2020   Dialysis AV fistula malfunction, initial encounter (HCC)    Jugular vein occlusion, right (HCC)    Failure of surgically constructed arteriovenous fistula (HCC) 10/03/2020   Myoclonus 08/31/2020   Clotted renal dialysis AV graft, initial encounter (HCC)    Hemodialysis-associated hypotension    Irritable bowel syndrome 02/25/2020   Adenomatous duodenal polyp 09/10/2019   History of GI bleed 09/10/2019   Hand steal syndrome (HCC) 08/01/2017   CAD (coronary artery disease) 06/05/2017   Intestinal ischemia (HCC)    Complication of vascular access for dialysis 03/19/2017   Preoperative clearance 01/25/2017   H/O non-ST elevation myocardial infarction (NSTEMI) 10/24/2016   Non-ST elevation (NSTEMI) myocardial infarction [facility])    Heme positive stool    Cardiac arrest [facility])    Palliative care encounter    Goals of care, counseling/discussion    Flash pulmonary edema (HCC) 04/06/2016   History of colon cancer 01/27/2016   History of ovarian cancer 01/27/2016   PAF (paroxysmal atrial fibrillation) (HCC) 10/14/2015   Malignant neoplasm of right ovary (HCC) 10/14/2015   SVT (supraventricular tachycardia) 09/08/2015   Essential hypertension    Dyspnea    Constipation 03/12/2013   Abdominal wall pain in left flank 03/12/2013   Occlusion and stenosis of carotid artery without mention of cerebral infarction 01/24/2013   Hx of CABG 07/05/2012   Carotid artery disease (HCC) 07/05/2012   Mitral regurgitation 06/12/2012   Non-STEMI (non-ST elevated myocardial infarction) (HCC) 06/08/2012   Chronic combined systolic and diastolic CHF (congestive heart failure) (HCC) 05/02/2012   AVM (arteriovenous malformation) of small bowel, acquired 01/20/2012   GERD (gastroesophageal reflux disease) 01/09/2012   HLD (hyperlipidemia)  01/05/2012   Atherosclerotic heart disease of native coronary artery without angina pectoris 12/16/2011   Anxiety disorder 05/04/2011   End-stage renal disease on hemodialysis (HCC) 04/29/2011   Gout 04/29/2011   PCP:  Practice, [facility]:   [facility] [address] [place] - [address] [place] [address] Phone: [phone] Fax: [phone]  [facility] (ESRD Bundle Only) - [place], [place] - [address] [place] [address] Phone: [phone] Fax: [phone]  [facility] - [address] [place], [place] - [address] [place] [address] Phone: [phone] Fax: [phone]  [facility] [address] [place] [address] Phone: [phone] Fax: [phone]     Social Determinants of Health (SDOH) Social History: SDOH Screenings   Food Insecurity: No Food Insecurity (10/29/2022)  Housing: Low Risk  (10/29/2022)  Transportation Needs: No Transportation Needs (10/29/2022)  Utilities: Not At Risk (10/29/2022)  Financial Resource Strain: Low Risk  (12/14/2018)  Physical Activity: Unknown (12/14/2018)  Social Connections: Unknown (12/14/2018)  Stress: No Stress Concern Present (12/14/2018)  Tobacco Use: Low Risk  (11/18/2022)   SDOH Interventions: Transportation Interventions: Intervention Not Indicated,  Inpatient TOC, Patient Resources (Friends/Family)   Readmission Risk Interventions    10/31/2022   12:45 PM 08/29/2022    4:47 PM 11/12/2021    8:56 AM  Readmission Risk Prevention Plan  Transportation Screening Complete Complete Complete  Medication Review (RN Care Manager) Complete Complete Complete  PCP or Specialist appointment within 3-5 days of discharge  Complete Complete  HRI or Home Care Consult Complete Complete Complete  SW Recovery Care/Counseling Consult Complete Complete Complete  Palliative Care Screening Not Applicable Not Applicable Not Applicable   Skilled Nursing Facility Not Applicable Complete Complete

## 2022-11-30 NOTE — ED Triage Notes (Signed)
Patient reports persistent diarrhea for 3 weeks and bloody stools today .

## 2022-11-30 NOTE — Progress Notes (Signed)
Same day note  Shawna [name] is a 82 y.o. female with past medical history of end-stage renal disease on hemodialysis Monday Wednesday Friday, seizure disorder, paroxysmal atrial fibrillation not on anticoagulation secondary to history of GI bleed, hypertension, hyperlipidemia, CAD status post CABG and PCI, hypothyroidism, small bowel AVM who was recently admitted for  recurrent GI bleeding with acute on chronic blood loss anemia, to the hospital with frequent loose bowel movements for last 3 days with some blood when wiping wiping.  In the ED, vitals were stable.  Hemoglobin was p7.1 from 9.2, 12 days back.  FOBT was positive.  1 unit of packed RBC was transfused.  BMP showed potassium of 3.4 with creatinine of 7.8.  [facility] was consulted and was admitted hospital for further evaluation and treatment.    Patient seen and examined at bedside.  Patient was admitted to the hospital for frequent loose stools.  At the time of my evaluation, patient complains no abdominal pain nausea or vomiting.  Had 1 episode of loose bowel movement  Physical examination reveals elderly female, not in obvious distress, pallor noted.  Abdomen nontender.  Laboratory data and imaging was reviewed,  Assessment/Plan  Recurrent rectal bleeding/hx of small bowel AVM peptic ulcer disease.  Remote history of colon cancer s/p resection in the 90s.  EGD done 08/28/2022, continue IV Protonix.  GI has been consulted.  Follow recommendation.   Acute blood loss anemia, history of anemia of chronic disease and ESRD Latest hemoglobin of 7.1.  FOBT positive.  1 unit of packed RBC ordered.  ESRD on HD MWF Will consult nephrology for resumption of hemodialysis.  On clear liquids.   Hypokalemia/High anion gap metabolic acidosis Consult nephrology.  Can be managed with hemodialysis.  Check BMP in AM   Diarrhea.   GI consult for further evaluation.  Will get GI pathogen panel and C. difficile due to loose watery stools.    Coronary artery disease status post CABG and PCI Hold aspirin from home.   Hyperlipidemia On Lipitor.   Hypertension Antihypertensives on hold.   Seizure disorder Continue Keppra.    No Charge  Signed,  Hasna Stefanik Raj Lynix Bonine, MD [facility]  

## 2022-11-30 NOTE — Progress Notes (Signed)
New Admission Note:   Arrival Method: ED stretcher Mental Orientation: alert x 4 Telemetry: yes Assessment: Completed Skin: see flowsheet IV: rt upper arm Pain: none Tubes: none Safety Measures: Safety Fall Prevention Plan has been discussed Admission: Completed [facility] Orientation: Patient has been orientated to the room, unit and staff.  Family: husband at bedside  Orders have been reviewed and implemented. Will continue to monitor the patient. Call light has been placed within reach and bed alarm has been activated.   Artemio Aly BSN, RN Phone number: [phone]

## 2022-11-30 NOTE — Progress Notes (Signed)
Pt has delayed blood transfusion due to IV was infiltrated, IV team came by and got a new IV, but Dialysis got her for HD. They were inform to give the 1 unit of blood but HD was not able to give the blood due to busy assignment. Blood transfusion is currently running, no reaction noted.

## 2022-11-30 NOTE — Consult Note (Addendum)
                                             Consultation Note   Referring Provider:  [facility] PCP: [facility] Primary Gastroenterologist: Dr. Castaneda        Reason for consultation: rectal bleeding, drop in hgb  DOA: 11/30/2022         Hospital Day: 1   Attending physician's note  I have taken a history, reviewed the chart and examined the patient. I performed a substantive portion of this encounter, including complete performance of at least one of the key components, in conjunction with the APP. I agree with the APP's note, impression and recommendations.   82-year-old very pleasant female with end-stage renal disease on dialysis admitted with worsening anemia.  Patient gives history of having pieces of blood in stool " looks like raisins"  She has history of gastric ulcer and also jejunal adenomatous polyp.  She will benefit from push enteroscopy for further evaluation  The risks and benefits as well as alternatives of endoscopic procedure(s) have been discussed and reviewed. All questions answered. The patient agrees to proceed.  Monitor hemoglobin and transfuse to >7 N.p.o. after midnight   The patient was provided an opportunity to ask questions and all were answered. The patient agreed with the plan and demonstrated an understanding of the instructions.  K. Veena Saxon Barich , MD [phone]     Assessment and Plan   82 yo female with a very complex medical history as listed in PMH. She has a history of IDA / GI bleeding / small bowel AVMs, PUD. She had a non-bleeding gastric ulcer and an adenomatous jejunal polyp ( not removed) on most recent EGD 4/24. Currently admitted with dark blood in stool with drop in hgb from 9 >>7. -To get hemodialysis today.  - Continue BID IV PPI  - A unit of blood has been ordered.  - Consider repeat EGD or enteroscopy this admission.  - Clear liquids okay from GI standpoint  -  She takes Octreotide injections at  home ( ? For history of AVMs)  Acute on chronic diarrhea. Infectious? Secondary to GI bleeding? If having diarrhea inpatient then will check stool studies given recent antibiotics  Generalized abdominal pain . Etiology unclear.  Recently evaluated by IR for possible mesenteric ischemia but it was not felt to be source of her pain. Continues to have periodic generalized abdominal pain but not meal related.   Remote colon cancer, s/p resection in 1990's / history of adenomatous colon polyps on last colonoscopy in Aug 2020, including a large transverse colon polyp.   Jejunal tubular adenoma ( left (intact and area tattooed on enteroscopy 11/02/22)  ESRD on HD Nephrology to see, will dialyze today   History of Present Illness Patient is a 82 y.o. year old female whose past medical history includes but is not necessarily limited to chronic diarrhea, colon polyps, GERD, iron deficiency anemia, colon cancer s/p resection in 1992, small bowel AVMs, duodenal adenoma, PUD, A-fib on ASA, CAD s/p stenting, ESRD, dementia, ovarian cancer, TIA  Patient has had previous admissions for anemia / GI bleeding. Followed by [facility]. Last office note from 10/06/22 gives an extensive outline of previous GI procedures which I will transport to this note.   Since above last office visit patient was hospitalized again and   seen by Gi on 10/29/22 for IDA. Inpatient enteroscopy remarkable for a non-bleeding gastric ulcer ( see full report below) and a 10 cm jejunal polyp which was biopsied and proved to be a tubular adenoma.  Gastric ulcer biopsies >> chronic inactive gastritis without H.pylori. She did receive a unit of PRBCs on 4/20 with improvement in hgb into 9-10 range.   On 5/16 she was seen outpatient by IR for abdominal pain and concern for mesenteric ischemia. However, her abdominal pain  resolved. There was no definitive evidence of a hemodynamically significant narrowing affecting at least 2 of the 3 major  mesenteric vessels.  It appeared her symptoms may be attributable to a enteritis and unlikely to be associated with an ischemic etiology.   Patient currently admitted diarrhea with blood. She has a history of CHRONIC diarrhea but it got worse a few weeks ago. She did recently have antibiotics. She describes dark, nearly black blood in stool last night and again this am. Denies any red blood.  Stool FOBT +. No N/V. She takes a daily baby asa , no other NSAIDs. Also on omeprazole. Getting transfused 1 u PRBCs Hgb 7.1  Kyrin had severe generalized abdominal pain following BM this am. Pain has since resolved.    Labs and Imaging: Recent Labs    11/30/22 0130  WBC 9.5  HGB 7.1*  HCT 23.0*  PLT 261   Recent Labs    11/30/22 0130  NA 135  K 3.4*  CL 101  CO2 14*  GLUCOSE 118*  BUN 59*  CREATININE 7.84*  CALCIUM 7.9*   Recent Labs    11/30/22 0130  PROT 7.1  ALBUMIN 3.3*  AST 6*  ALT 11  ALKPHOS 62  BILITOT 0.7   No results for input(s): "HEPBSAG", "HCVAB", "HEPAIGM", "HEPBIGM" in the last 72 hours. No results for input(s): "LABPROT", "INR" in the last 72 hours.  Previous GI Evaluation:   Per [facility]'s office note 10/06/22 07/2016 EGD for anemia: -prior duodenal AVMs, recent NSTEMI and pending cardiac cath.  2 cm hiatal hernia.  Normal stomach and esophagus.  Solitary duodenal versus jejunal polyp was not removed.  Nonbleeding jejunal AVM treated with APC and may have been source for anemia.   02/2019 colonoscopy.  For evaluation gastrointestinal bleeding: -Pandiverticulosis.  Patent ileocolonic anastomosis.  Removed 2 small polyps (TAs) from ascending colon.  Piecemeal resection using a lift technique of large polyp (TA) in transverse colon.   09/2019 EGD for evaluation duodenal polyp: Esophagus, Z-line normal.  3 cm hiatal hernia.  Normal stomach and duodenal bulb.  Solitary, diminutive, sessile polyp at D2 was biopsied (adenoma wo HGD).  Exam completed to third  duodenum.   02/05/2021 EGD for melena, blood loss anemia: -Showed mild severity esophageal candidiasis without bleeding.  Gastritis, nonbleeding.  Normal examined duodenum to D2.  Treated with Diflucan for 14 days and continue daily oral PPI.   04/22/2021 VCE: -Successful, complete study.  Small, flat polyp in either second or third portion duodenum with no stigmata of bleeding.  2 polyps at distal small bowel, 1 small size the other polyp medium.  No bleeding stigmata in either of these polyps.  Dr Rehman rec push enteroscopy.     06/08/2021 colonoscopy, enteroscopy were cxld due to IV infiltration RUE.  LUE not amenable to IV as her fistula is in that arm. Plans for outpt PICC line and proceed with GI procedures in January 2022, but procedures never happened. Meds include low-dose aspirin.  Prilosec   20 mg/day, Zofran prn.  Folic acid. Mircera, last dose 1/22.    08/11/2022 EGD: -2 cm hiatal hernia, normal esophagus otherwise, duodenal AVM status post APC, 5 mm duodenal polyp removed (duodenal adenoma)   08/11/2022 colonoscopy: -end-to-end ileocolonic anastomosis ascending colon, end-to-side colocolonic anastomosis rectosigmoid colon, normal neoterminal ileum, 6 polyps removed in the transverse, descending, sigmoid colons.  Tubular adenomas on pathology   EGD 08/27/2022: -multiple gastric AVMs treated with APC, small hiatal hernia, no active or stigmata of bleeding. VCE placed   VCE 08/27/22 read 08/28/22 -A few small AVMs in the small bowel, likely jejunum.  2 small polyps in the distal small bowel previously noted.  No active or stigmata of bleeding.  Capsule appears to sit at the ileocolonic anastomosis for the last 2 to 3 hours of study.  Enteroscopy 11/01/22 - 2 cm hiatal hernia.  - Non-bleeding gastric ulcer with a clean ulcer base (Forrest Class III).  Biopsied.  - Normal examined duodenum.  - Jejunal polyp(s).  Biopsied.  Tattooed.  - Non-bleeding jejunal diverticula.    A. STOMACH,  BIOPSY:  -  Antral mucosa with area of erosion as well as histiocytosis  suggestive of repair in the background of a mild to moderate chronic  inactive gastritis.  -  Oxyntic mucosa with mild chronic inactive gastritis.  -  An immunohistochemical stain for Helicobacter pylori organisms is negative.   Note: Immunohistochemical stains for CD68 highlight histiocytes and  keratin cocktail highlight the epithelium, negative for malignancy.   B. JEJUNUM, BIOPSY:  -  Tubular adenoma.   Past Medical History:  Diagnosis Date   Acute on chronic respiratory failure with hypoxia (HCC) 10/10/2016   Anxiety    Arthritis    AVM (arteriovenous malformation) of colon    CAD (coronary artery disease)    a. s/p CABG in 2013 b. DES to D1 in 10/2016. c. cath in 07/2018 showing patent grafts with occlusion of D1 at prior stent site and progression of PDA disease --> medical management recommended   Carotid artery disease (HCC)    a. 60-79% LICA, 03/2012    Chronic bronchitis (HCC)    Chronic HFrEF (heart failure with reduced ejection fraction) (HCC)    Colon cancer (HCC) 1992   Esophageal stricture    ESRD on hemodialysis (HCC)    ESRD due to HTN, started dialysis 2011 and gets HD at [facility] with Dr Befakadu on MWF schedule.  Access is LUA AVF as of Sept 2014.    GERD (gastroesophageal reflux disease)    High cholesterol 12/2011   History of blood transfusion 07/2011; 12/2011; 01/2012 X 2; 04/2012   History of gout    History of lower GI bleeding    Hypertension    Iron deficiency anemia    Jugular vein occlusion, right (HCC)    Mitral regurgitation    a. Moderate by echo, 02/2012   Mitral valve disease    NSVT (nonsustained ventricular tachycardia) (HCC)    Ovarian cancer (HCC) 1992   PAF (paroxysmal atrial fibrillation) (HCC)    Pneumonia ~ 2009   PUD (peptic ulcer disease)    TIA (transient ischemic attack)    Tricuspid valve disease     Past Surgical History:  Procedure Laterality Date    A/V FISTULAGRAM Left 10/04/2022   Procedure: A/V Fistulagram;  Surgeon: Schnier, Gregory G, MD;  Location: [facility];  Service: Cardiovascular;  Laterality: Left;   A/V SHUNTOGRAM Left 03/19/2019   Procedure: A/V SHUNTOGRAM;    Surgeon: Schnier, Gregory G, MD;  Location: [facility];  Service: Cardiovascular;  Laterality: Left;   ABDOMINAL HYSTERECTOMY  1992   APPENDECTOMY  06/1990   AV FISTULA PLACEMENT  07/2009   left upper arm   AV FISTULA PLACEMENT Right 09/06/2016   Procedure: RIGHT FOREARM ARTERIOVENOUS (AV) GRAFT;  Surgeon: Charles E Fields, MD;  [facility]: [facility];  Service: Vascular;  Laterality: Right;   AV FISTULA PLACEMENT N/A 02/24/2017   Procedure: INSERTION OF ARTERIOVENOUS (AV) GORE-TEX GRAFT ARM (BRACHIAL AXILLARY);  Surgeon: Schnier, Gregory G, MD;  Location: [facility];  Service: Vascular;  Laterality: N/A;   AVGG REMOVAL Right 09/06/2016   Procedure: REMOVAL OF Right Arm ARTERIOVENOUS GORETEX GRAFT and Vein Patch angioplasty of brachial artery;  Surgeon: Christopher S Dickson, MD;  [facility]: [facility];  Service: Vascular;  Laterality: Right;   BIOPSY  09/26/2019   Procedure: BIOPSY;  Surgeon: Rehman, Najeeb U, MD;  [facility];  Service: Endoscopy;;   BIOPSY  11/01/2022   Procedure: BIOPSY;  Surgeon: Castaneda Mayorga, Daniel, MD;  [facility];  Service: Gastroenterology;;   COLON RESECTION  1992   COLON SURGERY     COLONOSCOPY N/A 03/09/2019   Procedure: COLONOSCOPY;  Surgeon: Rehman, Najeeb U, MD;  [facility];  Service: Endoscopy;  Laterality: N/A;   COLONOSCOPY N/A 08/11/2022   Procedure: COLONOSCOPY;  Surgeon: Pyrtle, Jay M, MD;  [facility];  Service: Gastroenterology;  Laterality: N/A;   COLONOSCOPY WITH PROPOFOL N/A 06/08/2021   Procedure: COLONOSCOPY WITH PROPOFOL;  Surgeon: Castaneda Mayorga, Daniel, MD;  [facility];  Service: Gastroenterology;  Laterality: N/A;  9:05 /Patient is on dialysis Mon Wed Fri    CORONARY ANGIOPLASTY WITH STENT PLACEMENT  12/15/11   "2"   CORONARY ANGIOPLASTY WITH STENT PLACEMENT  y/2013   "1; makes total of 3" (05/02/2012)   CORONARY ARTERY BYPASS GRAFT  06/13/2012   Procedure: CORONARY ARTERY BYPASS GRAFTING (CABG);  Surgeon: Edward B Gerhardt, MD;  [facility]: [facility];  Service: Open Heart Surgery;  Laterality: N/A;  cabg x four;  using left internal mammary artery, and left leg greater saphenous vein harvested endoscopically   CORONARY STENT INTERVENTION N/A 10/13/2016   Procedure: Coronary Stent Intervention;  Surgeon: Thomas A Kelly, MD;  [facility];  Service: Cardiovascular;  Laterality: N/A;   DIALYSIS/PERMA CATHETER REMOVAL N/A 04/18/2017   Procedure: DIALYSIS/PERMA CATHETER REMOVAL;  Surgeon: Schnier, Gregory G, MD;  Location: [facility];  Service: Cardiovascular;  Laterality: N/A;   DILATION AND CURETTAGE OF UTERUS     ENTEROSCOPY N/A 06/08/2021   Procedure: PUSH ENTEROSCOPY;  Surgeon: Castaneda Mayorga, Daniel, MD;  [facility];  Service: Gastroenterology;  Laterality: N/A;   ENTEROSCOPY N/A 11/01/2022   Procedure: ENTEROSCOPY;  Surgeon: Castaneda Mayorga, Daniel, MD;  [facility];  Service: Gastroenterology;  Laterality: N/A;   ESOPHAGOGASTRODUODENOSCOPY  01/20/2012   Procedure: ESOPHAGOGASTRODUODENOSCOPY (EGD);  Surgeon: Malcolm T Stark, MD,FACG;  [facility];  Service: Endoscopy;  Laterality: N/A;   ESOPHAGOGASTRODUODENOSCOPY N/A 03/26/2013   Procedure: ESOPHAGOGASTRODUODENOSCOPY (EGD);  Surgeon: John N Perry, MD;  [facility];  Service: Endoscopy;  Laterality: N/A;   ESOPHAGOGASTRODUODENOSCOPY N/A 04/30/2015   Procedure: ESOPHAGOGASTRODUODENOSCOPY (EGD);  Surgeon: Najeeb U Rehman, MD;  [facility];  Service: Endoscopy;  Laterality: N/A;  1pm - moved to 10/20 @ 1:10   ESOPHAGOGASTRODUODENOSCOPY N/A 07/29/2016   Procedure: ESOPHAGOGASTRODUODENOSCOPY (EGD);  Surgeon: Steven Paul    Armbruster, MD;  [facility];  Service: Gastroenterology;  Laterality: N/A;  enteroscopy   ESOPHAGOGASTRODUODENOSCOPY N/A 09/26/2019   Procedure: ESOPHAGOGASTRODUODENOSCOPY (EGD);  Surgeon: Rehman, Najeeb U, MD;  [facility];  Service: Endoscopy;  Laterality: N/A;  1250   ESOPHAGOGASTRODUODENOSCOPY N/A 08/11/2022   Procedure: ESOPHAGOGASTRODUODENOSCOPY (EGD);  Surgeon: Pyrtle, Jay M, MD;  [facility];  Service: Gastroenterology;  Laterality: N/A;   ESOPHAGOGASTRODUODENOSCOPY (EGD) WITH PROPOFOL N/A 02/05/2021   Procedure: ESOPHAGOGASTRODUODENOSCOPY (EGD) WITH PROPOFOL;  Surgeon: Carver, Charles K, DO;  [facility];  Service: Endoscopy;  Laterality: N/A;   ESOPHAGOGASTRODUODENOSCOPY (EGD) WITH PROPOFOL N/A 08/27/2022   Procedure: ESOPHAGOGASTRODUODENOSCOPY (EGD) WITH PROPOFOL;  Surgeon: Carver, Charles K, DO;  [facility];  Service: Endoscopy;  Laterality: N/A;   GIVENS CAPSULE STUDY N/A 03/07/2019   Procedure: GIVENS CAPSULE STUDY;  Surgeon: Rehman, Najeeb U, MD;  [facility];  Service: Endoscopy;  Laterality: N/A;  7:30   GIVENS CAPSULE STUDY N/A 04/22/2021   Procedure: GIVENS CAPSULE STUDY;  Surgeon: Rehman, Najeeb U, MD;  [facility];  Service: Endoscopy;  Laterality: N/A;  7:30   GIVENS CAPSULE STUDY N/A 08/27/2022   Procedure: GIVENS CAPSULE STUDY;  Surgeon: Carver, Charles K, DO;  [facility];  Service: Endoscopy;  Laterality: N/A;   HOT HEMOSTASIS N/A 08/11/2022   Procedure: HOT HEMOSTASIS (ARGON PLASMA COAGULATION/BICAP);  Surgeon: Pyrtle, Jay M, MD;  [facility];  Service: Gastroenterology;  Laterality: N/A;   INSERTION OF DIALYSIS CATHETER N/A 10/05/2020   Procedure: ABORTED TUNNELED DIALYSIS CATHETER PLACEMENT RIGHT INTERNAL JUGULAR VEIN ;  Surgeon: Bridges, Lindsay C, MD;  Location: [facility];  Service: General;  Laterality: N/A;   INTRAOPERATIVE TRANSESOPHAGEAL ECHOCARDIOGRAM  06/13/2012    Procedure: INTRAOPERATIVE TRANSESOPHAGEAL ECHOCARDIOGRAM;  Surgeon: Edward B Gerhardt, MD;  [facility];  Service: Open Heart Surgery;  Laterality: N/A;   IR DIALY SHUNT INTRO NEEDLE/INTRACATH INITIAL W/IMG LEFT Left 10/06/2020   IR FLUORO GUIDE CV LINE RIGHT  06/17/2020   IR FLUORO GUIDE CV LINE RIGHT  11/12/2022   IR GENERIC HISTORICAL  07/26/2016   IR FLUORO GUIDE CV LINE RIGHT 07/26/2016 Michael Shick, MD [facility]   IR GENERIC HISTORICAL  07/26/2016   IR US GUIDE VASC ACCESS RIGHT 07/26/2016 Michael Shick, MD [facility]   IR GENERIC HISTORICAL  08/02/2016   IR US GUIDE VASC ACCESS RIGHT 08/02/2016 Michael Shick, MD [facility]   IR GENERIC HISTORICAL  08/02/2016   IR FLUORO GUIDE CV LINE RIGHT 08/02/2016 Michael Shick, MD [facility]   IR RADIOLOGY PERIPHERAL GUIDED IV START  03/28/2017   IR REMOVAL TUN CV CATH W/O FL  08/11/2020   IR REMOVAL TUN CV CATH W/O FL  11/15/2022   IR THROMBECTOMY AV FISTULA W/THROMBOLYSIS INC/SHUNT/IMG LEFT Left 06/17/2020   IR US GUIDE VASC ACCESS LEFT  06/17/2020   IR US GUIDE VASC ACCESS RIGHT  03/28/2017   IR US GUIDE VASC ACCESS RIGHT  06/17/2020   IR US GUIDE VASC ACCESS RIGHT  11/12/2022   LEFT HEART CATH AND CORONARY ANGIOGRAPHY N/A 09/20/2016   Procedure: Left Heart Cath and Coronary Angiography;  Surgeon: Henry W Smith, MD;  [facility];  Service: Cardiovascular;  Laterality: N/A;   LEFT HEART CATH AND CORS/GRAFTS ANGIOGRAPHY N/A 10/13/2016   Procedure: Left Heart Cath and Cors/Grafts Angiography;  Surgeon: Thomas A Kelly, MD;  [facility];  Service: Cardiovascular;  Laterality: N/A;     LEFT HEART CATH AND CORS/GRAFTS ANGIOGRAPHY N/A 07/13/2018   Procedure: LEFT HEART CATH AND CORS/GRAFTS ANGIOGRAPHY;  Surgeon: Jordan, Peter M, MD;  [facility];  Service: Cardiovascular;  Laterality: N/A;   LEFT HEART CATH AND CORS/GRAFTS ANGIOGRAPHY N/A 07/22/2021   Procedure: LEFT HEART CATH AND CORS/GRAFTS ANGIOGRAPHY;   Surgeon: Berry, Jonathan J, MD;  [facility];  Service: Cardiovascular;  Laterality: N/A;   LEFT HEART CATHETERIZATION WITH CORONARY ANGIOGRAM N/A 12/15/2011   Procedure: LEFT HEART CATHETERIZATION WITH CORONARY ANGIOGRAM;  Surgeon: Christopher D [name], MD;  [facility];  Service: Cardiovascular;  Laterality: N/A;   LEFT HEART CATHETERIZATION WITH CORONARY ANGIOGRAM N/A 01/10/2012   Procedure: LEFT HEART CATHETERIZATION WITH CORONARY ANGIOGRAM;  Surgeon: Peter M Jordan, MD;  [facility];  Service: Cardiovascular;  Laterality: N/A;   LEFT HEART CATHETERIZATION WITH CORONARY ANGIOGRAM N/A 06/08/2012   Procedure: LEFT HEART CATHETERIZATION WITH CORONARY ANGIOGRAM;  Surgeon: Christopher D [name], MD;  [facility];  Service: Cardiovascular;  Laterality: N/A;   LEFT HEART CATHETERIZATION WITH CORONARY/GRAFT ANGIOGRAM N/A 12/10/2013   Procedure: LEFT HEART CATHETERIZATION WITH CORONARY/GRAFT ANGIOGRAM;  Surgeon: Jayadeep S Varanasi, MD;  [facility];  Service: Cardiovascular;  Laterality: N/A;   OVARY SURGERY     ovarian cancer   POLYPECTOMY  03/09/2019   Procedure: POLYPECTOMY;  Surgeon: Rehman, Najeeb U, MD;  [facility];  Service: Endoscopy;;  cecal    POLYPECTOMY N/A 09/26/2019   Procedure: DUODENAL POLYPECTOMY;  Surgeon: Rehman, Najeeb U, MD;  [facility];  Service: Endoscopy;  Laterality: N/A;   POLYPECTOMY  08/11/2022   Procedure: POLYPECTOMY;  Surgeon: Pyrtle, Jay M, MD;  [facility];  Service: Gastroenterology;;   REVISION OF ARTERIOVENOUS GORETEX GRAFT N/A 02/24/2017   Procedure: REVISION OF ARTERIOVENOUS GORETEX GRAFT (RESECTION);  Surgeon: Schnier, Gregory G, MD;  Location: [facility];  Service: Vascular;  Laterality: N/A;   REVISON OF ARTERIOVENOUS FISTULA Left 06/19/2020   Procedure: REVISION OF LEFT UPPER ARM AV GRAFT WITH INTERPOSITION JUMP GRAFT USING 6MM GORE LIMB;  Surgeon: Clark, Christopher J, MD;   [facility];  Service: Vascular;  Laterality: Left;   SHUNTOGRAM N/A 10/15/2013   Procedure: Fistulogram;  Surgeon: Vance W Brabham, MD;  [facility];  Service: Cardiovascular;  Laterality: N/A;   SUBMUCOSAL TATTOO INJECTION  11/01/2022   Procedure: SUBMUCOSAL TATTOO INJECTION;  Surgeon: Castaneda Mayorga, Daniel, MD;  [facility];  Service: Gastroenterology;;   THROMBECTOMY / ARTERIOVENOUS GRAFT REVISION  2011   left upper arm   TUBAL LIGATION  1980's   UPPER EXTREMITY ANGIOGRAPHY Bilateral 12/06/2016   Procedure: Upper Extremity Angiography;  Surgeon: Schnier, Gregory G, MD;  Location: [facility];  Service: Cardiovascular;  Laterality: Bilateral;   UPPER EXTREMITY INTERVENTION Left 06/06/2017   Procedure: UPPER EXTREMITY INTERVENTION;  Surgeon: Schnier, Gregory G, MD;  Location: [facility];  Service: Cardiovascular;  Laterality: Left;   UPPER EXTREMITY VENOGRAPHY Left 10/04/2022   Procedure: UPPER EXTREMITY VENOGRAPHY;  Surgeon: Schnier, Gregory G, MD;  Location: [facility];  Service: Cardiovascular;  Laterality: Left;    Family History  Problem Relation Age of Onset   Heart disease Mother        Heart Disease before age 60   Hyperlipidemia Mother    Hypertension Mother    Diabetes Mother    Heart attack Mother    Heart disease Father          Heart Disease before age 60   Hyperlipidemia Father    Hypertension Father    Diabetes Father    Diabetes Sister    Hypertension Sister    Diabetes Brother    Hyperlipidemia Brother    Heart attack Brother    Hypertension Sister    Heart attack Brother    Colon cancer Child 57   Other Other        noncontributory for early CAD   Esophageal cancer Neg Hx    Liver disease Neg Hx    Kidney disease Neg Hx    Colon polyps Neg Hx     Prior to Admission medications   Medication Sig Start Date End Date Taking? Authorizing Provider  ALPRAZolam (XANAX) 0.5 MG tablet Take 1 tablet (0.5 mg  total) by mouth daily as needed for anxiety. 11/15/22   Singh, Prashant K, MD  amiodarone (PACERONE) 100 MG tablet Take 1 tablet (100 mg total) by mouth daily. 04/27/22   Samtani, Jai-Gurmukh, MD  amoxicillin-clavulanate (AUGMENTIN) 500-125 MG tablet Take 1 tablet by mouth 2 (two) times daily. 11/15/22   Singh, Prashant K, MD  Aspirin 81 MG CAPS Take 1 capsule by mouth daily at 12 noon.    [provider]  atorvastatin (LIPITOR) 10 MG tablet Take 1 tablet by mouth at bedtime.    [provider]  benzonatate (TESSALON) 100 MG capsule Take 100 mg by mouth 3 (three) times daily as needed for cough. 05/10/22   [provider]  carvedilol (COREG) 3.125 MG tablet Take 1 tablet (3.125 mg total) by mouth 2 (two) times daily with a meal. 11/15/22   Singh, Prashant K, MD  cyclobenzaprine (FLEXERIL) 5 MG tablet Take 5 mg by mouth 3 (three) times daily as needed.    [provider]  folic acid (FOLVITE) 1 MG tablet Take 1 mg by mouth daily. 05/24/22   [provider]  gabapentin (NEURONTIN) 100 MG capsule TAKE 1 CAPSULE BY MOUTH IN THE EVENING    [provider]  guaiFENesin-codeine 100-10 MG/5ML syrup Take 5 mLs by mouth every 4 (four) hours as needed for cough. 10/23/22   Haviland, Julie, MD  hydrALAZINE (APRESOLINE) 100 MG tablet Take 1 tablet (100 mg total) by mouth every 8 (eight) hours. 11/15/22   Singh, Prashant K, MD  hydrOXYzine (ATARAX) 25 MG tablet Take 25 mg by mouth every 6 (six) hours as needed for itching. 06/06/22   [provider]  levETIRAcetam (KEPPRA) 500 MG tablet Take 1 tablet (500 mg total) by mouth daily. 11/15/22   Singh, Prashant K, MD  levothyroxine (SYNTHROID) 75 MCG tablet Take 1 tablet (75 mcg total) by mouth daily before breakfast. 08/14/22 11/01/22  Pahwani, Ravi, MD  lisinopril (ZESTRIL) 40 MG tablet Take 1 tablet (40 mg total) by mouth daily. 11/15/22   Singh, Prashant K, MD  multivitamin (RENA-VIT) TABS tablet Take 1 tablet by  mouth daily.    [provider]  nitroGLYCERIN (NITROSTAT) 0.4 MG SL tablet Place 0.4 mg under the tongue every 5 (five) minutes as needed for chest pain.    [provider]  octreotide (SANDOSTATIN LAR) 20 MG injection Inject 20 mg into the muscle every 28 (twenty-eight) days. 11/15/22   Singh, Prashant K, MD  omeprazole (PRILOSEC) 40 MG capsule Take 1 capsule (40 mg total) by mouth daily. 10/06/22   Mahon, Courtney L, NP  ondansetron (ZOFRAN-ODT) 4 MG disintegrating tablet Take 4 mg by mouth every 8 (eight) hours as needed for   nausea or vomiting. 05/05/22   [provider]  oxyCODONE-acetaminophen (PERCOCET/ROXICET) 5-325 MG tablet Take 1 tablet by mouth every 6 (six) hours as needed for severe pain. 11/18/22   Zammit, Joseph, MD  oxyCODONE-acetaminophen (PERCOCET/ROXICET) 5-325 MG tablet Take 1 tablet by mouth every 6 (six) hours as needed for severe pain. 11/18/22   Zammit, Joseph, MD  predniSONE (DELTASONE) 10 MG tablet Take 2 tablets (20 mg total) by mouth daily. 11/18/22   Zammit, Joseph, MD  rosuvastatin (CRESTOR) 10 MG tablet Take 1 tablet by mouth once daily 06/06/22   Strader, Brittany M, PA-C  sevelamer carbonate (RENVELA) 800 MG tablet Take 3,200 mg by mouth in the morning and at bedtime. 12/27/21   [provider]  traMADol (ULTRAM) 50 MG tablet Take 1 tablet (50 mg total) by mouth every 12 (twelve) hours as needed for moderate pain. 11/15/22   Singh, Prashant K, MD    Current Facility-Administered Medications  Medication Dose Route Frequency Provider Last Rate Last Admin   acetaminophen (TYLENOL) tablet 650 mg  650 mg Oral Q6H PRN Hall, Carole N, DO       atorvastatin (LIPITOR) tablet 10 mg  10 mg Oral QHS Hall, Carole N, DO       Chlorhexidine Gluconate Cloth 2 % PADS 6 each  6 each Topical Q0600 Patel, Jay, MD       folic acid (FOLVITE) tablet 1 mg  1 mg Oral Daily Hall, Carole N, DO       gabapentin (NEURONTIN) capsule 100 mg  100 mg Oral QHS Hall,  Carole N, DO       hydrOXYzine (ATARAX) tablet 25 mg  25 mg Oral Q6H PRN Hall, Carole N, DO       levETIRAcetam (KEPPRA) tablet 500 mg  500 mg Oral Daily Hall, Carole N, DO       levothyroxine (SYNTHROID) tablet 75 mcg  75 mcg Oral QAC breakfast Hall, Carole N, DO       pantoprazole (PROTONIX) injection 40 mg  40 mg Intravenous Q12H Hall, Carole N, DO   40 mg at 11/30/22 0506   prochlorperazine (COMPAZINE) injection 5 mg  5 mg Intravenous Q6H PRN Hall, Carole N, DO       sevelamer carbonate (RENVELA) tablet 3,200 mg  3,200 mg Oral TID WC Hall, Carole N, DO        Allergies as of 11/30/2022 - Reviewed 11/30/2022  Allergen Reaction Noted   Amlodipine Swelling 01/19/2015   Aspirin Other (See Comments) 12/15/2011   Nitrofurantoin Hives 07/08/2009   Ranexa [ranolazine] Other (See Comments) 08/30/2020   Bactrim [sulfamethoxazole-trimethoprim] Rash 12/15/2011   Contrast media [iodinated contrast media] Itching 12/15/2011   Iron Itching and Other (See Comments) 05/02/2012   Gabapentin Other (See Comments) 02/08/2017   Iron sucrose Other (See Comments) 04/15/2021   Sucroferric oxyhydroxide Other (See Comments) 04/15/2021   Levaquin [levofloxacin in d5w] Rash 10/04/2013   Pantoprazole Rash 03/21/2013   Plavix [clopidogrel bisulfate] Rash 01/05/2012   Protonix [pantoprazole sodium] Rash 03/21/2013   Venofer [ferric oxide] Itching and Other (See Comments) 05/02/2012    Social History   Socioeconomic History   Marital status: Married    Spouse name: Nathaniel Mannella   Number of children: 1   Years of education: Not on file   Highest education level: GED or equivalent  Occupational History   Occupation: retired- fabrication factory worker- sewed  Tobacco Use   Smoking status: Never   Smokeless tobacco: Never  Vaping Use     Vaping Use: Never used  Substance and Sexual Activity   Alcohol use: No    Alcohol/week: 0.0 standard drinks of alcohol   Drug use: No   Sexual activity: Not  Currently    Birth control/protection: Surgical  Other Topics Concern   Not on file  Social History Narrative   Lives in [place], [place] with husband.  Dialysis pt - [facility].   Social Determinants of Health   Financial Resource Strain: Low Risk  (12/14/2018)   Overall Financial Resource Strain (CARDIA)    Difficulty of Paying Living Expenses: Not very hard  Food Insecurity: No Food Insecurity (10/29/2022)   Hunger Vital Sign    Worried About Running Out of Food in the Last Year: Never true    Ran Out of Food in the Last Year: Never true  Transportation Needs: No Transportation Needs (10/29/2022)   PRAPARE - Transportation    Lack of Transportation (Medical): No    Lack of Transportation (Non-Medical): No  Physical Activity: Unknown (12/14/2018)   Exercise Vital Sign    Days of Exercise per Week: Patient declined    Minutes of Exercise per Session: Patient declined  Stress: No Stress Concern Present (12/14/2018)   [employer] - Occupational Stress Questionnaire    Feeling of Stress : Only a little  Social Connections: Unknown (12/14/2018)   Social Connection and Isolation Panel [NHANES]    Frequency of Communication with Friends and Family: Patient declined    Frequency of Social Gatherings with Friends and Family: Patient declined    Attends Religious Services: Patient declined    Active Member of Clubs or Organizations: Patient declined    Attends Club or Organization Meetings: Patient declined    Marital Status: Patient declined  Intimate Partner Violence: Not At Risk (10/29/2022)   Humiliation, Afraid, Rape, and Kick questionnaire    Fear of Current or Ex-Partner: No    Emotionally Abused: No    Physically Abused: No    Sexually Abused: No     Code Status   Code Status: Full Code  Review of Systems: All systems reviewed and negative except where noted in HPI.  Physical Exam: Vital signs in last 24 hours: Temp:  [97.6 F (36.4 C)-98.1 F (36.7 C)] 97.6  F (36.4 C) (05/22 0629) Pulse Rate:  [35-65] 62 (05/22 0629) Resp:  [12-24] 17 (05/22 0629) BP: (149-177)/(45-71) 177/65 (05/22 0629) SpO2:  [95 %-100 %] 99 % (05/22 0629) Last BM Date : 11/30/22  General:  Pleasant female in NAD Psych:  Cooperative. Normal mood and affect Eyes: Pupils equal Ears:  Normal auditory acuity Nose: No deformity, discharge or lesions Neck:  Supple, no masses felt Lungs:  Clear to auscultation.  Heart:  Regular rate, regular rhythm.  Abdomen:  Soft, nondistended, nontender, active bowel sounds, no masses felt Rectal :  Deferred Msk: Symmetrical without gross deformities.  Neurologic:  Alert, oriented, grossly normal neurologically Extremities : No edema Skin:  Intact without significant lesions.    Intake/Output from previous day: No intake/output data recorded. Intake/Output this shift:  No intake/output data recorded.     Paula Guenther, NP-C @  11/30/2022, 9:51 AM     

## 2022-11-30 NOTE — ED Notes (Signed)
ED TO INPATIENT HANDOFF REPORT  ED Nurse Name and Phone #: [phone]   S Name/Age/Gender Shawna [name] 82 y.o. female Room/Bed: 025C/025C  Code Status   Code Status: Full Code  Home/SNF/Other Home Patient oriented to: self, place, time, and situation Is this baseline? Yes   Triage Complete: Triage complete  Chief Complaint Rectal bleeding [K62.5]  Triage Note Patient reports persistent diarrhea for 3 weeks and bloody stools today .    Allergies Allergies  Allergen Reactions   Amlodipine Swelling   Aspirin Other (See Comments)    High Doses Mess up her stomach; "makes my bowels have blood in them". Takes 81 mg EC Aspirin    Nitrofurantoin Hives   Ranexa [Ranolazine] Other (See Comments)    Myoclonus-hospitalized    Bactrim [Sulfamethoxazole-Trimethoprim] Rash   Contrast Media [Iodinated Contrast Media] Itching   Iron Itching and Other (See Comments)    "they gave me iron in dialysis; had to give me Benadryl cause I had to have the iron" (05/02/2012)   Gabapentin Other (See Comments)    Unknown reaction   Iron Sucrose Other (See Comments)    Unknown   Sucroferric Oxyhydroxide Other (See Comments)    Unknown   Levaquin [Levofloxacin In D5w] Rash   Pantoprazole Rash   Plavix [Clopidogrel Bisulfate] Rash   Protonix [Pantoprazole Sodium] Rash   Venofer [Ferric Oxide] Itching and Other (See Comments)    Patient reports using Benadryl prior to doses as [facility]    Level of Care/Admitting Diagnosis ED Disposition     ED Disposition  Admit   Condition  --   Comment  Hospital Area: [facility] [[id number]]  Level of Care: Telemetry Surgical [[id number]]  May admit patient to [facility] or [facility] if equivalent level of care is available:: No  Covid Evaluation: Asymptomatic - no recent exposure (last 10 days) testing not required  Diagnosis: Rectal bleeding [217577]  Admitting Physician: Darlin Drop [[id number]]  Attending Physician: Darlin Drop [[id number]]  Certification:: I certify this patient will need inpatient services for at least 2 midnights  Estimated Length of Stay: 2          B Medical/Surgery History Past Medical History:  Diagnosis Date   Acute on chronic respiratory failure with hypoxia (HCC) 10/10/2016   Anxiety    Arthritis    AVM (arteriovenous malformation) of colon    CAD (coronary artery disease)    a. s/p CABG in 2013 b. DES to D1 in 10/2016. c. cath in 07/2018 showing patent grafts with occlusion of D1 at prior stent site and progression of PDA disease --> medical management recommended   Carotid artery disease (HCC)    a. 60-79% LICA, 03/2012    Chronic bronchitis (HCC)    Chronic HFrEF (heart failure with reduced ejection fraction) (HCC)    Colon cancer (HCC) 1992   Esophageal stricture    ESRD on hemodialysis (HCC)    ESRD due to HTN, started dialysis 2011 and gets HD at [facility] with Dr Fausto Skillern on MWF schedule.  Access is LUA AVF as of Sept 2014.    GERD (gastroesophageal reflux disease)    High cholesterol 12/2011   History of blood transfusion 07/2011; 12/2011; 01/2012 X 2; 04/2012   History of gout    History of lower GI bleeding    Hypertension    Iron deficiency anemia    Jugular vein occlusion, right (HCC)    Mitral regurgitation  a. Moderate by echo, 02/2012   Mitral valve disease    NSVT (nonsustained ventricular tachycardia) (HCC)    Ovarian cancer (HCC) 1992   PAF (paroxysmal atrial fibrillation) (HCC)    Pneumonia ~ 2009   PUD (peptic ulcer disease)    TIA (transient ischemic attack)    Tricuspid valve disease    Past Surgical History:  Procedure Laterality Date   A/V FISTULAGRAM Left 10/04/2022   Procedure: A/V Fistulagram;  Surgeon: Renford Dills, MD;  Location: [facility] INVASIVE CV LAB;  Service: Cardiovascular;  Laterality: Left;   A/V SHUNTOGRAM Left 03/19/2019   Procedure: A/V SHUNTOGRAM;  Surgeon: Renford Dills, MD;  Location: [facility] INVASIVE CV LAB;  Service:  Cardiovascular;  Laterality: Left;   ABDOMINAL HYSTERECTOMY  1992   APPENDECTOMY  06/1990   AV FISTULA PLACEMENT  07/2009   left upper arm   AV FISTULA PLACEMENT Right 09/06/2016   Procedure: RIGHT FOREARM ARTERIOVENOUS (AV) GRAFT;  Surgeon: Sherren Kerns, MD;  [facility];  Service: Vascular;  Laterality: Right;   AV FISTULA PLACEMENT N/A 02/24/2017   Procedure: INSERTION OF ARTERIOVENOUS (AV) GORE-TEX GRAFT ARM (BRACHIAL AXILLARY);  Surgeon: Renford Dills, MD;  Location: [facility] ORS;  Service: Vascular;  Laterality: N/A;   AVGG REMOVAL Right 09/06/2016   Procedure: REMOVAL OF Right Arm ARTERIOVENOUS GORETEX GRAFT and Vein Patch angioplasty of brachial artery;  Surgeon: Chuck Hint, MD;  Location: [facility] OR;  Service: Vascular;  Laterality: Right;   BIOPSY  09/26/2019   Procedure: BIOPSY;  Surgeon: Malissa Hippo, MD;  [facility];  Service: Endoscopy;;   BIOPSY  11/01/2022   Procedure: BIOPSY;  Surgeon: Dolores Frame, MD;  [facility];  Service: Gastroenterology;;   COLON RESECTION  1992   COLON SURGERY     COLONOSCOPY N/A 03/09/2019   Procedure: COLONOSCOPY;  Surgeon: Malissa Hippo, MD;  [facility];  Service: Endoscopy;  Laterality: N/A;   COLONOSCOPY N/A 08/11/2022   Procedure: COLONOSCOPY;  Surgeon: Beverley Fiedler, MD;  Location: [facility] ENDOSCOPY;  Service: Gastroenterology;  Laterality: N/A;   COLONOSCOPY WITH PROPOFOL N/A 06/08/2021   Procedure: COLONOSCOPY WITH PROPOFOL;  Surgeon: Dolores Frame, MD;  [facility];  Service: Gastroenterology;  Laterality: N/A;  9:05 /Patient is on dialysis Mon Wed Fri   CORONARY ANGIOPLASTY WITH STENT PLACEMENT  12/15/11   "2"   CORONARY ANGIOPLASTY WITH STENT PLACEMENT  y/2013   "1; makes total of 3" (05/02/2012)   CORONARY ARTERY BYPASS GRAFT  06/13/2012   Procedure: CORONARY ARTERY BYPASS GRAFTING (CABG);  Surgeon: Delight Ovens, MD;  Location: [facility] OR;  Service: Open  Heart Surgery;  Laterality: N/A;  cabg x four;  using left internal mammary artery, and left leg greater saphenous vein harvested endoscopically   CORONARY STENT INTERVENTION N/A 10/13/2016   Procedure: Coronary Stent Intervention;  Surgeon: Lennette Bihari, MD;  [facility];  Service: Cardiovascular;  Laterality: N/A;   DIALYSIS/PERMA CATHETER REMOVAL N/A 04/18/2017   Procedure: DIALYSIS/PERMA CATHETER REMOVAL;  Surgeon: Renford Dills, MD;  Location: [facility] INVASIVE CV LAB;  Service: Cardiovascular;  Laterality: N/A;   DILATION AND CURETTAGE OF UTERUS     ENTEROSCOPY N/A 06/08/2021   Procedure: PUSH ENTEROSCOPY;  Surgeon: Dolores Frame, MD;  [facility];  Service: Gastroenterology;  Laterality: N/A;   ENTEROSCOPY N/A 11/01/2022   Procedure: ENTEROSCOPY;  Surgeon: Dolores Frame, MD;  [facility];  Service: Gastroenterology;  Laterality: N/A;   ESOPHAGOGASTRODUODENOSCOPY  01/20/2012   Procedure: ESOPHAGOGASTRODUODENOSCOPY (EGD);  Surgeon: Meryl Dare, MD,FACG;  Location: [facility] ENDOSCOPY;  Service: Endoscopy;  Laterality: N/A;   ESOPHAGOGASTRODUODENOSCOPY N/A 03/26/2013   Procedure: ESOPHAGOGASTRODUODENOSCOPY (EGD);  Surgeon: Hilarie Fredrickson, MD;  Location: [facility] ENDOSCOPY;  Service: Endoscopy;  Laterality: N/A;   ESOPHAGOGASTRODUODENOSCOPY N/A 04/30/2015   Procedure: ESOPHAGOGASTRODUODENOSCOPY (EGD);  Surgeon: Malissa Hippo, MD;  [facility];  Service: Endoscopy;  Laterality: N/A;  1pm - moved to 10/20 @ 1:10   ESOPHAGOGASTRODUODENOSCOPY N/A 07/29/2016   Procedure: ESOPHAGOGASTRODUODENOSCOPY (EGD);  Surgeon: Ruffin Frederick, MD;  Location: [facility] ENDOSCOPY;  Service: Gastroenterology;  Laterality: N/A;  enteroscopy   ESOPHAGOGASTRODUODENOSCOPY N/A 09/26/2019   Procedure: ESOPHAGOGASTRODUODENOSCOPY (EGD);  Surgeon: Malissa Hippo, MD;  [facility];  Service: Endoscopy;  Laterality: N/A;  1250    ESOPHAGOGASTRODUODENOSCOPY N/A 08/11/2022   Procedure: ESOPHAGOGASTRODUODENOSCOPY (EGD);  Surgeon: Beverley Fiedler, MD;  Location: [facility] ENDOSCOPY;  Service: Gastroenterology;  Laterality: N/A;   ESOPHAGOGASTRODUODENOSCOPY (EGD) WITH PROPOFOL N/A 02/05/2021   Procedure: ESOPHAGOGASTRODUODENOSCOPY (EGD) WITH PROPOFOL;  Surgeon: Lanelle Bal, DO;  [facility];  Service: Endoscopy;  Laterality: N/A;   ESOPHAGOGASTRODUODENOSCOPY (EGD) WITH PROPOFOL N/A 08/27/2022   Procedure: ESOPHAGOGASTRODUODENOSCOPY (EGD) WITH PROPOFOL;  Surgeon: Lanelle Bal, DO;  [facility];  Service: Endoscopy;  Laterality: N/A;   GIVENS CAPSULE STUDY N/A 03/07/2019   Procedure: GIVENS CAPSULE STUDY;  Surgeon: Malissa Hippo, MD;  [facility];  Service: Endoscopy;  Laterality: N/A;  7:30   GIVENS CAPSULE STUDY N/A 04/22/2021   Procedure: GIVENS CAPSULE STUDY;  Surgeon: Malissa Hippo, MD;  [facility];  Service: Endoscopy;  Laterality: N/A;  7:30   GIVENS CAPSULE STUDY N/A 08/27/2022   Procedure: GIVENS CAPSULE STUDY;  Surgeon: Lanelle Bal, DO;  [facility];  Service: Endoscopy;  Laterality: N/A;   HOT HEMOSTASIS N/A 08/11/2022   Procedure: HOT HEMOSTASIS (ARGON PLASMA COAGULATION/BICAP);  Surgeon: Beverley Fiedler, MD;  Location: [facility] ENDOSCOPY;  Service: Gastroenterology;  Laterality: N/A;   INSERTION OF DIALYSIS CATHETER N/A 10/05/2020   Procedure: ABORTED TUNNELED DIALYSIS CATHETER PLACEMENT RIGHT INTERNAL JUGULAR VEIN ;  Surgeon: Lucretia Roers, MD;  [facility];  Service: General;  Laterality: N/A;   INTRAOPERATIVE TRANSESOPHAGEAL ECHOCARDIOGRAM  06/13/2012   Procedure: INTRAOPERATIVE TRANSESOPHAGEAL ECHOCARDIOGRAM;  Surgeon: Delight Ovens, MD;  Location: [facility] OR;  Service: Open Heart Surgery;  Laterality: N/A;   IR DIALY SHUNT INTRO NEEDLE/INTRACATH INITIAL W/IMG LEFT Left 10/06/2020   IR FLUORO GUIDE CV LINE RIGHT  06/17/2020   IR FLUORO GUIDE CV LINE  RIGHT  11/12/2022   IR GENERIC HISTORICAL  07/26/2016   IR FLUORO GUIDE CV LINE RIGHT 07/26/2016 Berdine Dance, MD [facility]   IR GENERIC HISTORICAL  07/26/2016   IR US GUIDE VASC ACCESS RIGHT 07/26/2016 Berdine Dance, MD [facility]   IR GENERIC HISTORICAL  08/02/2016   IR US GUIDE VASC ACCESS RIGHT 08/02/2016 Berdine Dance, MD [facility]   IR GENERIC HISTORICAL  08/02/2016   IR FLUORO GUIDE CV LINE RIGHT 08/02/2016 Berdine Dance, MD [facility]   IR RADIOLOGY PERIPHERAL GUIDED IV START  03/28/2017   IR REMOVAL TUN CV CATH W/O FL  08/11/2020   IR REMOVAL TUN CV CATH W/O FL  11/15/2022   IR THROMBECTOMY AV FISTULA W/THROMBOLYSIS INC/SHUNT/IMG LEFT Left 06/17/2020   IR US GUIDE  VASC ACCESS LEFT  06/17/2020   IR US GUIDE VASC ACCESS RIGHT  03/28/2017   IR US GUIDE VASC ACCESS RIGHT  06/17/2020   IR US GUIDE VASC ACCESS RIGHT  11/12/2022   LEFT HEART CATH AND CORONARY ANGIOGRAPHY N/A 09/20/2016   Procedure: Left Heart Cath and Coronary Angiography;  Surgeon: Lyn Records, MD;  Location: [facility] INVASIVE CV LAB;  Service: Cardiovascular;  Laterality: N/A;   LEFT HEART CATH AND CORS/GRAFTS ANGIOGRAPHY N/A 10/13/2016   Procedure: Left Heart Cath and Cors/Grafts Angiography;  Surgeon: Lennette Bihari, MD;  [facility];  Service: Cardiovascular;  Laterality: N/A;   LEFT HEART CATH AND CORS/GRAFTS ANGIOGRAPHY N/A 07/13/2018   Procedure: LEFT HEART CATH AND CORS/GRAFTS ANGIOGRAPHY;  Surgeon: Swaziland, Peter M, MD;  Location: [facility] INVASIVE CV LAB;  Service: Cardiovascular;  Laterality: N/A;   LEFT HEART CATH AND CORS/GRAFTS ANGIOGRAPHY N/A 07/22/2021   Procedure: LEFT HEART CATH AND CORS/GRAFTS ANGIOGRAPHY;  Surgeon: Runell Gess, MD;  [facility];  Service: Cardiovascular;  Laterality: N/A;   LEFT HEART CATHETERIZATION WITH CORONARY ANGIOGRAM N/A 12/15/2011   Procedure: LEFT HEART CATHETERIZATION WITH CORONARY ANGIOGRAM;  Surgeon: Kathleene Hazel, MD;  Location: [facility] CATH LAB;   Service: Cardiovascular;  Laterality: N/A;   LEFT HEART CATHETERIZATION WITH CORONARY ANGIOGRAM N/A 01/10/2012   Procedure: LEFT HEART CATHETERIZATION WITH CORONARY ANGIOGRAM;  Surgeon: Peter M Swaziland, MD;  Location: [facility] CATH LAB;  Service: Cardiovascular;  Laterality: N/A;   LEFT HEART CATHETERIZATION WITH CORONARY ANGIOGRAM N/A 06/08/2012   Procedure: LEFT HEART CATHETERIZATION WITH CORONARY ANGIOGRAM;  Surgeon: Kathleene Hazel, MD;  Location: [facility] CATH LAB;  Service: Cardiovascular;  Laterality: N/A;   LEFT HEART CATHETERIZATION WITH CORONARY/GRAFT ANGIOGRAM N/A 12/10/2013   Procedure: LEFT HEART CATHETERIZATION WITH Isabel Caprice;  Surgeon: Corky Crafts, MD;  Location: [facility] CATH LAB;  Service: Cardiovascular;  Laterality: N/A;   OVARY SURGERY     ovarian cancer   POLYPECTOMY  03/09/2019   Procedure: POLYPECTOMY;  Surgeon: Malissa Hippo, MD;  [facility];  Service: Endoscopy;;  cecal    POLYPECTOMY N/A 09/26/2019   Procedure: DUODENAL POLYPECTOMY;  Surgeon: Malissa Hippo, MD;  [facility];  Service: Endoscopy;  Laterality: N/A;   POLYPECTOMY  08/11/2022   Procedure: POLYPECTOMY;  Surgeon: Beverley Fiedler, MD;  Location: [facility] ENDOSCOPY;  Service: Gastroenterology;;   REVISION OF ARTERIOVENOUS GORETEX GRAFT N/A 02/24/2017   Procedure: REVISION OF ARTERIOVENOUS GORETEX GRAFT (RESECTION);  Surgeon: Renford Dills, MD;  Location: [facility] ORS;  Service: Vascular;  Laterality: N/A;   REVISON OF ARTERIOVENOUS FISTULA Left 06/19/2020   Procedure: REVISION OF LEFT UPPER ARM AV GRAFT WITH INTERPOSITION JUMP GRAFT USING GORE LIMB;  Surgeon: Cephus Shelling, MD;  Location: [facility] OR;  Service: Vascular;  Laterality: Left;   SHUNTOGRAM N/A 10/15/2013   Procedure: Fistulogram;  Surgeon: Nada Libman, MD;  Location: [facility] CATH LAB;  Service: Cardiovascular;  Laterality: N/A;   SUBMUCOSAL TATTOO INJECTION  11/01/2022   Procedure: SUBMUCOSAL TATTOO INJECTION;  Surgeon:  Dolores Frame, MD;  [facility];  Service: Gastroenterology;;   THROMBECTOMY / ARTERIOVENOUS GRAFT REVISION  2011   left upper arm   TUBAL LIGATION  1980's   UPPER EXTREMITY ANGIOGRAPHY Bilateral 12/06/2016   Procedure: Upper Extremity Angiography;  Surgeon: Renford Dills, MD;  Location: [facility] INVASIVE CV LAB;  Service: Cardiovascular;  Laterality: Bilateral;   UPPER EXTREMITY INTERVENTION Left  06/06/2017   Procedure: UPPER EXTREMITY INTERVENTION;  Surgeon: Renford Dills, MD;  Location: [facility] INVASIVE CV LAB;  Service: Cardiovascular;  Laterality: Left;   UPPER EXTREMITY VENOGRAPHY Left 10/04/2022   Procedure: UPPER EXTREMITY VENOGRAPHY;  Surgeon: Renford Dills, MD;  Location: [facility] INVASIVE CV LAB;  Service: Cardiovascular;  Laterality: Left;     A IV Location/Drains/Wounds Patient Lines/Drains/Airways Status     Active Line/Drains/Airways     Name Placement date Placement time Site Days   Peripheral IV --  --  --  --   Peripheral IV 11/30/22 20 G 1.88" Anterior;Right;Upper Arm 11/30/22  0500  Arm  less than 1   Fistula / Graft Left Upper arm Arteriovenous fistula 03/19/19  --  Upper arm  1352   Hemodialysis Catheter Left Subclavian Double lumen Temporary (Non-Tunneled) 06/09/22  --  Subclavian  174   Wound / Incision (Open or Dehisced) 11/14/21 Arm Left;Posterior;Proximal;Upper left proximal axilla large blackhead 11/14/21  1736  Arm  381            Intake/Output Last 24 hours No intake or output data in the 24 hours ending 11/30/22 0532  Labs/Imaging Results for orders placed or performed during the hospital encounter of 11/30/22 (from the past 48 hour(s))  CBC with Differential     Status: Abnormal   Collection Time: 11/30/22  1:30 AM  Result Value Ref Range   WBC 9.5 4.0 - 10.5 K/uL   RBC 1.98 (L) 3.87 - 5.11 MIL/uL   Hemoglobin 7.1 (L) 12.0 - 15.0 g/dL   HCT 16.1 (L) 09.6 - 04.5 %   MCV 116.2 (H) 80.0 - 100.0 fL   MCH 35.9 (H) 26.0 -  34.0 pg   MCHC 30.9 30.0 - 36.0 g/dL   RDW 40.9 (H) 81.1 - 91.4 %   Platelets 261 150 - 400 K/uL    Comment: REPEATED TO VERIFY   nRBC 0.0 0.0 - 0.2 %   Neutrophils Relative % 83 %   Neutro Abs 7.8 (H) 1.7 - 7.7 K/uL   Lymphocytes Relative 6 %   Lymphs Abs 0.6 (L) 0.7 - 4.0 K/uL   Monocytes Relative 9 %   Monocytes Absolute 0.8 0.1 - 1.0 K/uL   Eosinophils Relative 1 %   Eosinophils Absolute 0.1 0.0 - 0.5 K/uL   Basophils Relative 1 %   Basophils Absolute 0.1 0.0 - 0.1 K/uL   Immature Granulocytes 0 %   Abs Immature Granulocytes 0.04 0.00 - 0.07 K/uL    Comment: Performed at [facility] Lab, [name], [place], [place] [address]  Comprehensive metabolic panel     Status: Abnormal   Collection Time: 11/30/22  1:30 AM  Result Value Ref Range   Sodium 135 135 - 145 mmol/L   Potassium 3.4 (L) 3.5 - 5.1 mmol/L   Chloride 101 98 - 111 mmol/L   CO2 14 (L) 22 - 32 mmol/L   Glucose, Bld 118 (H) 70 - 99 mg/dL    Comment: Glucose reference range applies only to samples taken after fasting for at least 8 hours.   BUN 59 (H) 8 - 23 mg/dL   Creatinine, Ser 6.21 (H) 0.44 - 1.00 mg/dL   Calcium 7.9 (L) 8.9 - 10.3 mg/dL   Total Protein 7.1 6.5 - 8.1 g/dL   Albumin 3.3 (L) 3.5 - 5.0 g/dL   AST 6 (L) 15 - 41 U/L   ALT 11 0 - 44 U/L   Alkaline Phosphatase 62 38 -  126 U/L   Total Bilirubin 0.7 0.3 - 1.2 mg/dL   GFR, Estimated 5 (L) >60 mL/min    Comment: (NOTE) Calculated using the CKD-EPI Creatinine Equation (2021)    Anion gap 20 (H) 5 - 15    Comment: Performed at [facility] Lab, [name], [place], [place] [address]  POC occult blood, ED     Status: Abnormal   Collection Time: 11/30/22  2:42 AM  Result Value Ref Range   Fecal Occult Bld POSITIVE (A) NEGATIVE  Type and screen [facility]     Status: None (Preliminary result)   Collection Time: 11/30/22  4:50 AM  Result Value Ref Range   ABO/RH(D) PENDING    Antibody Screen PENDING    Sample  Expiration      12/03/2022,2359 Performed at [facility] Lab, [address], [place], [place] [address]    *Note: Due to a large number of results and/or encounters for the requested time period, some results have not been displayed. A complete set of results can be found in Results Review.   No results found.  Pending Labs Unresulted Labs (From admission, onward)    None       Vitals/Pain Today's Vitals   11/30/22 0215 11/30/22 0230 11/30/22 0245 11/30/22 0512  BP: (!) 163/55 (!) 164/71 (!) 156/62   Pulse: 61 64 (!) 54 60  Resp: (!) 24  15   Temp:    97.9 F (36.6 C)  TempSrc:    Oral  SpO2: 97% 97% 100%   PainSc:        Isolation Precautions No active isolations  Medications Medications  pantoprazole (PROTONIX) injection 40 mg (40 mg Intravenous Given 11/30/22 0506)  atorvastatin (LIPITOR) tablet 10 mg (has no administration in time range)  sevelamer carbonate (RENVELA) tablet 3,200 mg (has no administration in time range)  levETIRAcetam (KEPPRA) tablet 500 mg (has no administration in time range)  levothyroxine (SYNTHROID) tablet 75 mcg (has no administration in time range)  hydrOXYzine (ATARAX) tablet 25 mg (has no administration in time range)  folic acid (FOLVITE) tablet 1 mg (has no administration in time range)  gabapentin (NEURONTIN) capsule 100 mg (has no administration in time range)  acetaminophen (TYLENOL) tablet 650 mg (has no administration in time range)  prochlorperazine (COMPAZINE) injection 5 mg (has no administration in time range)    Mobility walks     Focused Assessments GI-   R Recommendations: See Admitting Provider Note  Report given to:   Additional Notes: please call with any questions

## 2022-11-30 NOTE — H&P (Addendum)
History and Physical  ANALYCIA STIVERSON ZOX:[id number] DOB: [birth year] DOA: 11/30/2022  Referring physician: Chestine Spore, PA-EDP  PCP: [facility]  Outpatient Specialists: GI, Cardiology, Nephrology Patient coming from: Home  Chief Complaint: Frequent loose stools.  HPI: Shawna [name] is a 82 y.o. female with medical history significant for ESRD on HD MWF, seizure disorder, paroxysmal A-fib no longer on oral anticoagulation due to history of recurrent GI bleed, hypertension, hyperlipidemia, coronary artery disease status post CABG and PCI, hypothyroidism, Hx of small bowel AVMs, recently discharged after being admitted for recurrent GI bleeding with acute on chronic blood loss anemia, who presents to [facility] ED from home, with frequent loose stools for the past 3 weeks.  Associated with sight of blood when she wipes.  In the ED, Hg 7.1 k from 9.2 k 12 days ago.  FOBT positive.  1 U PRBCs ordered to be transfused.  [facility] consulted by EDP. Admitted by [facility], hospitalist service, to telemetry surgical unit as inpatient status.  ED Course: Temperature 98.1.  BP 156/62, pulse 62, respiration rate 19, O2 saturation 98% on room air.  Serum potassium 3.4, serum bicarb 14 serum glucose 118, BUN 59, creatinine 7.84, anion gap 20.  Review of Systems: Review of systems as noted in the HPI. All other systems reviewed and are negative.   Past Medical History:  Diagnosis Date   Acute on chronic respiratory failure with hypoxia (HCC) 10/10/2016   Anxiety    Arthritis    AVM (arteriovenous malformation) of colon    CAD (coronary artery disease)    a. s/p CABG in 2013 b. DES to D1 in 10/2016. c. cath in 07/2018 showing patent grafts with occlusion of D1 at prior stent site and progression of PDA disease --> medical management recommended   Carotid artery disease (HCC)    a. 60-79% LICA, 03/2012    Chronic bronchitis (HCC)    Chronic HFrEF (heart failure with reduced ejection fraction) (HCC)     Colon cancer (HCC) 1992   Esophageal stricture    ESRD on hemodialysis (HCC)    ESRD due to HTN, started dialysis 2011 and gets HD at [facility] with Dr Fausto Skillern on MWF schedule.  Access is LUA AVF as of Sept 2014.    GERD (gastroesophageal reflux disease)    High cholesterol 12/2011   History of blood transfusion 07/2011; 12/2011; 01/2012 X 2; 04/2012   History of gout    History of lower GI bleeding    Hypertension    Iron deficiency anemia    Jugular vein occlusion, right (HCC)    Mitral regurgitation    a. Moderate by echo, 02/2012   Mitral valve disease    NSVT (nonsustained ventricular tachycardia) (HCC)    Ovarian cancer (HCC) 1992   PAF (paroxysmal atrial fibrillation) (HCC)    Pneumonia ~ 2009   PUD (peptic ulcer disease)    TIA (transient ischemic attack)    Tricuspid valve disease    Past Surgical History:  Procedure Laterality Date   A/V FISTULAGRAM Left 10/04/2022   Procedure: A/V Fistulagram;  Surgeon: Renford Dills, MD;  Location: [facility] INVASIVE CV LAB;  Service: Cardiovascular;  Laterality: Left;   A/V SHUNTOGRAM Left 03/19/2019   Procedure: A/V SHUNTOGRAM;  Surgeon: Renford Dills, MD;  Location: [facility] INVASIVE CV LAB;  Service: Cardiovascular;  Laterality: Left;   ABDOMINAL HYSTERECTOMY  1992   APPENDECTOMY  06/1990   AV FISTULA PLACEMENT  07/2009   left upper arm  AV FISTULA PLACEMENT Right 09/06/2016   Procedure: RIGHT FOREARM ARTERIOVENOUS (AV) GRAFT;  Surgeon: Sherren Kerns, MD;  Location: [facility] OR;  Service: Vascular;  Laterality: Right;   AV FISTULA PLACEMENT N/A 02/24/2017   Procedure: INSERTION OF ARTERIOVENOUS (AV) GORE-TEX GRAFT ARM (BRACHIAL AXILLARY);  Surgeon: Renford Dills, MD;  Location: [facility] ORS;  Service: Vascular;  Laterality: N/A;   AVGG REMOVAL Right 09/06/2016   Procedure: REMOVAL OF Right Arm ARTERIOVENOUS GORETEX GRAFT and Vein Patch angioplasty of brachial artery;  Surgeon: Chuck Hint, MD;  Location: [facility] OR;  Service:  Vascular;  Laterality: Right;   BIOPSY  09/26/2019   Procedure: BIOPSY;  Surgeon: Malissa Hippo, MD;  [facility];  Service: Endoscopy;;   BIOPSY  11/01/2022   Procedure: BIOPSY;  Surgeon: Dolores Frame, MD;  [facility];  Service: Gastroenterology;;   COLON RESECTION  1992   COLON SURGERY     COLONOSCOPY N/A 03/09/2019   Procedure: COLONOSCOPY;  Surgeon: Malissa Hippo, MD;  [facility];  Service: Endoscopy;  Laterality: N/A;   COLONOSCOPY N/A 08/11/2022   Procedure: COLONOSCOPY;  Surgeon: Beverley Fiedler, MD;  Location: [facility] ENDOSCOPY;  Service: Gastroenterology;  Laterality: N/A;   COLONOSCOPY WITH PROPOFOL N/A 06/08/2021   Procedure: COLONOSCOPY WITH PROPOFOL;  Surgeon: Dolores Frame, MD;  [facility];  Service: Gastroenterology;  Laterality: N/A;  9:05 /Patient is on dialysis Mon Wed Fri   CORONARY ANGIOPLASTY WITH STENT PLACEMENT  12/15/11   "2"   CORONARY ANGIOPLASTY WITH STENT PLACEMENT  y/2013   "1; makes total of 3" (05/02/2012)   CORONARY ARTERY BYPASS GRAFT  06/13/2012   Procedure: CORONARY ARTERY BYPASS GRAFTING (CABG);  Surgeon: Delight Ovens, MD;  Location[facility] OR;  Service: Open Heart Surgery;  Laterality: N/A;  cabg x four;  using left internal mammary artery, and left leg greater saphenous vein harvested endoscopically   CORONARY STENT INTERVENTION N/A 10/13/2016   Procedure: Coronary Stent Intervention;  Surgeon: Lennette Bihari, MD;  [facility] CV LAB;  Service: Cardiovascular;  Laterality: N/A;   DIALYSIS/PERMA CATHETER REMOVAL N/A 04/18/2017   Procedure: DIALYSIS/PERMA CATHETER REMOVAL;  Surgeon: Renford Dills, MD;  Location: [facility] INVASIVE CV LAB;  Service: Cardiovascular;  Laterality: N/A;   DILATION AND CURETTAGE OF UTERUS     ENTEROSCOPY N/A 06/08/2021   Procedure: PUSH ENTEROSCOPY;  Surgeon: Dolores Frame, MD;  [facility];  Service: Gastroenterology;  Laterality:  N/A;   ENTEROSCOPY N/A 11/01/2022   Procedure: ENTEROSCOPY;  Surgeon: Dolores Frame, MD;  [facility];  Service: Gastroenterology;  Laterality: N/A;   ESOPHAGOGASTRODUODENOSCOPY  01/20/2012   Procedure: ESOPHAGOGASTRODUODENOSCOPY (EGD);  Surgeon: Meryl Dare, MD,FACG;  Location: [facility] ENDOSCOPY;  Service: Endoscopy;  Laterality: N/A;   ESOPHAGOGASTRODUODENOSCOPY N/A 03/26/2013   Procedure: ESOPHAGOGASTRODUODENOSCOPY (EGD);  Surgeon: Hilarie Fredrickson, MD;  Location: [facility] ENDOSCOPY;  Service: Endoscopy;  Laterality: N/A;   ESOPHAGOGASTRODUODENOSCOPY N/A 04/30/2015   Procedure: ESOPHAGOGASTRODUODENOSCOPY (EGD);  Surgeon: Malissa Hippo, MD;  [facility];  Service: Endoscopy;  Laterality: N/A;  1pm - moved to 10/20 @ 1:10   ESOPHAGOGASTRODUODENOSCOPY N/A 07/29/2016   Procedure: ESOPHAGOGASTRODUODENOSCOPY (EGD);  Surgeon: Ruffin Frederick, MD;  Location: [facility] ENDOSCOPY;  Service: Gastroenterology;  Laterality: N/A;  enteroscopy   ESOPHAGOGASTRODUODENOSCOPY N/A 09/26/2019   Procedure: ESOPHAGOGASTRODUODENOSCOPY (EGD);  Surgeon: Malissa Hippo, MD;  [facility];  Service: Endoscopy;  Laterality: N/A;  1250   ESOPHAGOGASTRODUODENOSCOPY N/A 08/11/2022   Procedure: ESOPHAGOGASTRODUODENOSCOPY (EGD);  Surgeon: Beverley Fiedler, MD;  Location: [facility] ENDOSCOPY;  Service: Gastroenterology;  Laterality: N/A;   ESOPHAGOGASTRODUODENOSCOPY (EGD) WITH PROPOFOL N/A 02/05/2021   Procedure: ESOPHAGOGASTRODUODENOSCOPY (EGD) WITH PROPOFOL;  Surgeon: Lanelle Bal, DO;  [facility];  Service: Endoscopy;  Laterality: N/A;   ESOPHAGOGASTRODUODENOSCOPY (EGD) WITH PROPOFOL N/A 08/27/2022   Procedure: ESOPHAGOGASTRODUODENOSCOPY (EGD) WITH PROPOFOL;  Surgeon: Lanelle Bal, DO;  [facility];  Service: Endoscopy;  Laterality: N/A;   GIVENS CAPSULE STUDY N/A 03/07/2019   Procedure: GIVENS CAPSULE STUDY;  Surgeon: Malissa Hippo, MD;  [facility];   Service: Endoscopy;  Laterality: N/A;  7:30   GIVENS CAPSULE STUDY N/A 04/22/2021   Procedure: GIVENS CAPSULE STUDY;  Surgeon: Malissa Hippo, MD;  [facility];  Service: Endoscopy;  Laterality: N/A;  7:30   GIVENS CAPSULE STUDY N/A 08/27/2022   Procedure: GIVENS CAPSULE STUDY;  Surgeon: Lanelle Bal, DO;  [facility];  Service: Endoscopy;  Laterality: N/A;   HOT HEMOSTASIS N/A 08/11/2022   Procedure: HOT HEMOSTASIS (ARGON PLASMA COAGULATION/BICAP);  Surgeon: Beverley Fiedler, MD;  Location: [facility] ENDOSCOPY;  Service: Gastroenterology;  Laterality: N/A;   INSERTION OF DIALYSIS CATHETER N/A 10/05/2020   Procedure: ABORTED TUNNELED DIALYSIS CATHETER PLACEMENT RIGHT INTERNAL JUGULAR VEIN ;  Surgeon: Lucretia Roers, MD;  Location: [facility]S;  Service: General;  Laterality: N/A;   INTRAOPERATIVE TRANSESOPHAGEAL ECHOCARDIOGRAM  06/13/2012   Procedure: INTRAOPERATIVE TRANSESOPHAGEAL ECHOCARDIOGRAM;  Surgeon: Delight Ovens, MD;  Location: [facility] OR;  Service: Open Heart Surgery;  Laterality: N/A;   IR DIALY SHUNT INTRO NEEDLE/INTRACATH INITIAL W/IMG LEFT Left 10/06/2020   IR FLUORO GUIDE CV LINE RIGHT  06/17/2020   IR FLUORO GUIDE CV LINE RIGHT  11/12/2022   IR GENERIC HISTORICAL  07/26/2016   IR FLUORO GUIDE CV LINE RIGHT 07/26/2016 Berdine Dance, MD [facility]   IR GENERIC HISTORICAL  07/26/2016   IR US GUIDE VASC ACCESS RIGHT 07/26/2016 Berdine Dance, MD [facility]   IR GENERIC HISTORICAL  08/02/2016   IR US GUIDE VASC ACCESS RIGHT 08/02/2016 Berdine Dance, MD [facility]   IR GENERIC HISTORICAL  08/02/2016   IR FLUORO GUIDE CV LINE RIGHT 08/02/2016 Berdine Dance, MD [facility]   IR RADIOLOGY PERIPHERAL GUIDED IV START  03/28/2017   IR REMOVAL TUN CV CATH W/O FL  08/11/2020   IR REMOVAL TUN CV CATH W/O FL  11/15/2022   IR THROMBECTOMY AV FISTULA W/THROMBOLYSIS INC/SHUNT/IMG LEFT Left 06/17/2020   IR US GUIDE VASC ACCESS LEFT  06/17/2020   IR US GUIDE VASC ACCESS RIGHT  03/28/2017    IR US GUIDE VASC ACCESS RIGHT  06/17/2020   IR US GUIDE VASC ACCESS RIGHT  11/12/2022   LEFT HEART CATH AND CORONARY ANGIOGRAPHY N/A 09/20/2016   Procedure: Left Heart Cath and Coronary Angiography;  Surgeon: Lyn Records, MD;  Location: [facility] INVASIVE CV LAB;  Service: Cardiovascular;  Laterality: N/A;   LEFT HEART CATH AND CORS/GRAFTS ANGIOGRAPHY N/A 10/13/2016   Procedure: Left Heart Cath and Cors/Grafts Angiography;  Surgeon: Lennette Bihari, MD;  [facility];  Service: Cardiovascular;  Laterality: N/A;   LEFT HEART CATH AND CORS/GRAFTS ANGIOGRAPHY N/A 07/13/2018   Procedure: LEFT HEART CATH AND CORS/GRAFTS ANGIOGRAPHY;  Surgeon: Swaziland, Peter M, MD;  Location: [facility] INVASIVE CV LAB;  Service: Cardiovascular;  Laterality: N/A;   LEFT HEART CATH  AND CORS/GRAFTS ANGIOGRAPHY N/A 07/22/2021   Procedure: LEFT HEART CATH AND CORS/GRAFTS ANGIOGRAPHY;  Surgeon: Runell Gess, MD;  [facility]: [facility] LAB;  Service: Cardiovascular;  Laterality: N/A;   LEFT HEART CATHETERIZATION WITH CORONARY ANGIOGRAM N/A 12/15/2011   Procedure: LEFT HEART CATHETERIZATION WITH CORONARY ANGIOGRAM;  Surgeon: Kathleene Hazel, MD;  Location: [facility] CATH LAB;  Service: Cardiovascular;  Laterality: N/A;   LEFT HEART CATHETERIZATION WITH CORONARY ANGIOGRAM N/A 01/10/2012   Procedure: LEFT HEART CATHETERIZATION WITH CORONARY ANGIOGRAM;  Surgeon: Peter M Swaziland, MD;  Location: [facility] CATH LAB;  Service: Cardiovascular;  Laterality: N/A;   LEFT HEART CATHETERIZATION WITH CORONARY ANGIOGRAM N/A 06/08/2012   Procedure: LEFT HEART CATHETERIZATION WITH CORONARY ANGIOGRAM;  Surgeon: Kathleene Hazel, MD;  Location: [facility] CATH LAB;  Service: Cardiovascular;  Laterality: N/A;   LEFT HEART CATHETERIZATION WITH CORONARY/GRAFT ANGIOGRAM N/A 12/10/2013   Procedure: LEFT HEART CATHETERIZATION WITH Isabel Caprice;  Surgeon: Corky Crafts, MD;  Location: [facility] CATH LAB;  Service: Cardiovascular;  Laterality: N/A;   OVARY  SURGERY     ovarian cancer   POLYPECTOMY  03/09/2019   Procedure: POLYPECTOMY;  Surgeon: Malissa Hippo, MD;  [facility];  Service: Endoscopy;;  cecal    POLYPECTOMY N/A 09/26/2019   Procedure: DUODENAL POLYPECTOMY;  Surgeon: Malissa Hippo, MD;  [facility];  Service: Endoscopy;  Laterality: N/A;   POLYPECTOMY  08/11/2022   Procedure: POLYPECTOMY;  Surgeon: Beverley Fiedler, MD;  Location: [facility] ENDOSCOPY;  Service: Gastroenterology;;   REVISION OF ARTERIOVENOUS GORETEX GRAFT N/A 02/24/2017   Procedure: REVISION OF ARTERIOVENOUS GORETEX GRAFT (RESECTION);  Surgeon: Renford Dills, MD;  Location: [facility] ORS;  Service: Vascular;  Laterality: N/A;   REVISON OF ARTERIOVENOUS FISTULA Left 06/19/2020   Procedure: REVISION OF LEFT UPPER ARM AV GRAFT WITH INTERPOSITION JUMP GRAFT USING GORE LIMB;  Surgeon: Cephus Shelling, MD;  Location: [facility] OR;  Service: Vascular;  Laterality: Left;   SHUNTOGRAM N/A 10/15/2013   Procedure: Fistulogram;  Surgeon: Nada Libman, MD;  Location: [facility] CATH LAB;  Service: Cardiovascular;  Laterality: N/A;   SUBMUCOSAL TATTOO INJECTION  11/01/2022   Procedure: SUBMUCOSAL TATTOO INJECTION;  Surgeon: Dolores Frame, MD;  [facility];  Service: Gastroenterology;;   THROMBECTOMY / ARTERIOVENOUS GRAFT REVISION  2011   left upper arm   TUBAL LIGATION  1980's   UPPER EXTREMITY ANGIOGRAPHY Bilateral 12/06/2016   Procedure: Upper Extremity Angiography;  Surgeon: Renford Dills, MD;  Location: [facility] INVASIVE CV LAB;  Service: Cardiovascular;  Laterality: Bilateral;   UPPER EXTREMITY INTERVENTION Left 06/06/2017   Procedure: UPPER EXTREMITY INTERVENTION;  Surgeon: Renford Dills, MD;  Location: [facility] INVASIVE CV LAB;  Service: Cardiovascular;  Laterality: Left;   UPPER EXTREMITY VENOGRAPHY Left 10/04/2022   Procedure: UPPER EXTREMITY VENOGRAPHY;  Surgeon: Renford Dills, MD;  Location: [facility] INVASIVE CV LAB;  Service:  Cardiovascular;  Laterality: Left;    Social History:  reports that she has never smoked. She has never used smokeless tobacco. She reports that she does not drink alcohol and does not use drugs.   Allergies  Allergen Reactions   Amlodipine Swelling   Aspirin Other (See Comments)    High Doses Mess up her stomach; "makes my bowels have blood in them". Takes 81 mg EC Aspirin    Nitrofurantoin Hives   Ranexa [Ranolazine] Other (See Comments)    Myoclonus-hospitalized    Bactrim [Sulfamethoxazole-Trimethoprim] Rash   Contrast Media [Iodinated  Contrast Media] Itching   Iron Itching and Other (See Comments)    "they gave me iron in dialysis; had to give me Benadryl cause I had to have the iron" (05/02/2012)   Gabapentin Other (See Comments)    Unknown reaction   Iron Sucrose Other (See Comments)    Unknown   Sucroferric Oxyhydroxide Other (See Comments)    Unknown   Levaquin [Levofloxacin In D5w] Rash   Pantoprazole Rash   Plavix [Clopidogrel Bisulfate] Rash   Protonix [Pantoprazole Sodium] Rash   Venofer [Ferric Oxide] Itching and Other (See Comments)    Patient reports using Benadryl prior to doses as [facility]    Family History  Problem Relation Age of Onset   Heart disease Mother        Heart Disease before age 83   Hyperlipidemia Mother    Hypertension Mother    Diabetes Mother    Heart attack Mother    Heart disease Father        Heart Disease before age 74   Hyperlipidemia Father    Hypertension Father    Diabetes Father    Diabetes Sister    Hypertension Sister    Diabetes Brother    Hyperlipidemia Brother    Heart attack Brother    Hypertension Sister    Heart attack Brother    Colon cancer Child 80   Other Other        noncontributory for early CAD   Esophageal cancer Neg Hx    Liver disease Neg Hx    Kidney disease Neg Hx    Colon polyps Neg Hx       Prior to Admission medications   Medication Sig Start Date End Date Taking? Authorizing  Provider  ALPRAZolam Prudy Feeler) 0.5 MG tablet Take 1 tablet (0.5 mg total) by mouth daily as needed for anxiety. 11/15/22   Leroy Sea, MD  amiodarone (PACERONE) 100 MG tablet Take 1 tablet (100 mg total) by mouth daily. 04/27/22   Rhetta Mura, MD  amoxicillin-clavulanate (AUGMENTIN) 500-125 MG tablet Take 1 tablet by mouth 2 (two) times daily. 11/15/22   Leroy Sea, MD  Aspirin 81 MG CAPS Take 1 capsule by mouth daily at 12 noon.    [provider]  atorvastatin (LIPITOR) 10 MG tablet Take 1 tablet by mouth at bedtime.    [provider]  benzonatate (TESSALON) 100 MG capsule Take 100 mg by mouth 3 (three) times daily as needed for cough. 05/10/22   [provider]  carvedilol (COREG) 3.125 MG tablet Take 1 tablet (3.125 mg total) by mouth 2 (two) times daily with a meal. 11/15/22   Leroy Sea, MD  cyclobenzaprine (FLEXERIL) 5 MG tablet Take 5 mg by mouth 3 (three) times daily as needed.    [provider]  folic acid (FOLVITE) 1 MG tablet Take 1 mg by mouth daily. 05/24/22   [provider]  gabapentin (NEURONTIN) 100 MG capsule TAKE 1 CAPSULE BY MOUTH IN THE EVENING    [provider]  guaiFENesin-codeine 100-10 MG/5ML syrup Take 5 mLs by mouth every 4 (four) hours as needed for cough. 10/23/22   Jacalyn Lefevre, MD  hydrALAZINE (APRESOLINE) 100 MG tablet Take 1 tablet (100 mg total) by mouth every 8 (eight) hours. 11/15/22   Leroy Sea, MD  hydrOXYzine (ATARAX) 25 MG tablet Take 25 mg by mouth every 6 (six) hours as needed for itching. 06/06/22   [provider]  levETIRAcetam (  KEPPRA) 500 MG tablet Take 1 tablet (500 mg total) by mouth daily. 11/15/22   Leroy Sea, MD  levothyroxine (SYNTHROID) 75 MCG tablet Take 1 tablet (75 mcg total) by mouth daily before breakfast. 08/14/22 11/01/22  Hughie Closs, MD  lisinopril (ZESTRIL) 40 MG tablet Take 1 tablet (40 mg total) by mouth daily. 11/15/22   Leroy Sea, MD  multivitamin (RENA-VIT) TABS tablet Take 1 tablet by mouth daily.    [provider]  nitroGLYCERIN (NITROSTAT) 0.4 MG SL tablet Place 0.4 mg under the tongue every 5 (five) minutes as needed for chest pain.    [provider]  octreotide (SANDOSTATIN LAR) 20 MG injection Inject 20 mg into the muscle every 28 (twenty-eight) days. 11/15/22   Leroy Sea, MD  omeprazole (PRILOSEC) 40 MG capsule Take 1 capsule (40 mg total) by mouth daily. 10/06/22   Aida Raider, NP  ondansetron (ZOFRAN-ODT) 4 MG disintegrating tablet Take 4 mg by mouth every 8 (eight) hours as needed for nausea or vomiting. 05/05/22   [provider]  oxyCODONE-acetaminophen (PERCOCET/ROXICET) 5-325 MG tablet Take 1 tablet by mouth every 6 (six) hours as needed for severe pain. 11/18/22   Bethann Berkshire, MD  oxyCODONE-acetaminophen (PERCOCET/ROXICET) 5-325 MG tablet Take 1 tablet by mouth every 6 (six) hours as needed for severe pain. 11/18/22   Bethann Berkshire, MD  predniSONE (DELTASONE) 10 MG tablet Take 2 tablets (20 mg total) by mouth daily. 11/18/22   Bethann Berkshire, MD  rosuvastatin (CRESTOR) 10 MG tablet Take 1 tablet by mouth once daily 06/06/22   Iran Ouch, [place] M, PA-C  sevelamer carbonate (RENVELA) 800 MG tablet Take 3,200 mg by mouth in the morning and at bedtime. 12/27/21   [provider]  traMADol (ULTRAM) 50 MG tablet Take 1 tablet (50 mg total) by mouth every 12 (twelve) hours as needed for moderate pain. 11/15/22   Leroy Sea, MD    Physical Exam: BP (!) 156/62   Pulse (!) 54   Temp 98.1 F (36.7 C) (Oral)   Resp 15   SpO2 100%   General: 82 y.o. year-old female well developed well nourished in no acute distress.  Alert and oriented x3. Cardiovascular: Regular rate and rhythm with no rubs or gallops.  No thyromegaly or JVD noted.  No lower extremity edema. 2/4 pulses in all 4 extremities. Respiratory: Clear to auscultation with no wheezes or rales. Good  inspiratory effort. Abdomen: Soft nontender nondistended with normal bowel sounds x4 quadrants. Muskuloskeletal: No cyanosis, clubbing or edema noted bilaterally Neuro: CN II-XII intact, strength, sensation, reflexes Skin: No ulcerative lesions noted or rashes Psychiatry: Judgement and insight appear normal. Mood is appropriate for condition and setting          Labs on Admission:  Basic Metabolic Panel: Recent Labs  Lab 11/30/22 0130  NA 135  K 3.4*  CL 101  CO2 14*  GLUCOSE 118*  BUN 59*  CREATININE 7.84*  CALCIUM 7.9*   Liver Function Tests: Recent Labs  Lab 11/30/22 0130  AST 6*  ALT 11  ALKPHOS 62  BILITOT 0.7  PROT 7.1  ALBUMIN 3.3*   No results for input(s): "LIPASE", "AMYLASE" in the last 168 hours. No results for input(s): "AMMONIA" in the last 168 hours. CBC: Recent Labs  Lab 11/30/22 0130  WBC 9.5  NEUTROABS 7.8*  HGB 7.1*  HCT 23.0*  MCV 116.2*  PLT 261   Cardiac Enzymes: No results for input(s): "CKTOTAL", "CKMB", "CKMBINDEX", "  TROPONINI" in the last 168 hours.  BNP (last 3 results) Recent Labs    08/06/22 0139 08/26/22 1410 10/23/22 1420  BNP 1,666.2* 1,590.0* 2,205.0*    ProBNP (last 3 results) No results for input(s): "PROBNP" in the last 8760 hours.  CBG: No results for input(s): "GLUCAP" in the last 168 hours.  Radiological Exams on Admission: No results found.  EKG: I independently viewed the EKG done and my findings are as followed:  None available  Assessment/Plan Present on Admission:  Rectal bleeding  Active Problems:   Rectal bleeding  Rectal bleeding/hx of angiodysplastic lesion in the stomach seen on EGD done 08/28/2022 IV Protonix 40 mg twice daily [facility] consulted  Acute blood loss anemia in the setting of anemia of chronic disease and ESRD Baseline hemoglobin appears to be 9, presented with hemoglobin of 7.1 Positive FOBT 1 unit PRBC ordered to be transfused. Repeat CBC post blood transfusion  ESRD  on HD MWF Consult nephrology in the morning to resume hemodialysis. Electrolytes and volume status managed with hemodialysis  Hypokalemia High anion gap metabolic acidosis Electrolytes managed with hemodialysis.  Loose stools, suspect in the setting of ongoing GI bleed Treat underlying condition  Coronary artery disease status post CABG and PCI Hold off home aspirin due to ongoing GI bleed  Hyperlipidemia Resume home Lipitor  Hypertension Resume home oral antihypertensive judiciously to avoid hypotension Closely monitor vital signs.  Seizure disorder Resume home Keppra    DVT prophylaxis: SCDs  Code Status: Full code.  Family Communication: Husband at bedside.  Disposition Plan: Admitted to telemetry surgical unit.  Consults called: [facility].  Admission status: Inpatient status.   Status is: Inpatient The patient requires at least 2 midnights for further evaluation and treatment of present condition.   Darlin Drop MD [facility] Pager [phone]  If 7PM-7AM, please contact night-coverage [email or web address] Password [email or web address]  11/30/2022, 4:05 AM

## 2022-11-30 NOTE — Consult Note (Signed)
Reason for Consult: Continuity of ESRD care Referring Physician: Joycelyn Das M.D. [facility])  HPI:  82 year old woman with past medical history significant for coronary artery disease status post CABG/PCI, history of congestive heart failure, hypertension, dyslipidemia, paroxysmal atrial fibrillation not on anticoagulation (recurrent GI bleed), history of recurrent GI bleeds with gastric and small bowel arteriovenous malformations and end-stage renal disease on hemodialysis on a Monday/Wednesday/Friday schedule at the [facility] unit in [place].  She was admitted yesterday after developing frequent loose stools with passing some blood clots in them along with some transient lightheadedness.  She reports that this started during her recent admission to the hospital but passing blood clots was a new event.  In the emergency room she was noted to have an acute hemoglobin drop from 9.2 to 7.1 and was Hemoccult positive.  She otherwise denies any complaints including chest pain, shortness of breath, nausea, vomiting or abdominal pain and has not had any syncopal event.  She denies any worsening lower extremity edema and has been adherent to her hemodialysis treatments.  Dialysis prescription: [facility], MWF, 3.5 hours, EDW 59.5 kg, BFR 300/DFR 600, 2K/2.5 calcium, left IJ TDC.  No heparin  Past Medical History:  Diagnosis Date   Acute on chronic respiratory failure with hypoxia (HCC) 10/10/2016   Anxiety    Arthritis    AVM (arteriovenous malformation) of colon    CAD (coronary artery disease)    a. s/p CABG in 2013 b. DES to D1 in 10/2016. c. cath in 07/2018 showing patent grafts with occlusion of D1 at prior stent site and progression of PDA disease --> medical management recommended   Carotid artery disease (HCC)    a. 60-79% LICA, 03/2012    Chronic bronchitis (HCC)    Chronic HFrEF (heart failure with reduced ejection fraction) (HCC)    Colon cancer (HCC) 1992   Esophageal stricture    ESRD on  hemodialysis (HCC)    ESRD due to HTN, started dialysis 2011 and gets HD at [facility] with Dr Fausto Skillern on MWF schedule.  Access is LUA AVF as of Sept 2014.    GERD (gastroesophageal reflux disease)    High cholesterol 12/2011   History of blood transfusion 07/2011; 12/2011; 01/2012 X 2; 04/2012   History of gout    History of lower GI bleeding    Hypertension    Iron deficiency anemia    Jugular vein occlusion, right (HCC)    Mitral regurgitation    a. Moderate by echo, 02/2012   Mitral valve disease    NSVT (nonsustained ventricular tachycardia) (HCC)    Ovarian cancer (HCC) 1992   PAF (paroxysmal atrial fibrillation) (HCC)    Pneumonia ~ 2009   PUD (peptic ulcer disease)    TIA (transient ischemic attack)    Tricuspid valve disease     Past Surgical History:  Procedure Laterality Date   A/V FISTULAGRAM Left 10/04/2022   Procedure: A/V Fistulagram;  Surgeon: Renford Dills, MD;  Location: [facility] INVASIVE CV LAB;  Service: Cardiovascular;  Laterality: Left;   A/V SHUNTOGRAM Left 03/19/2019   Procedure: A/V SHUNTOGRAM;  Surgeon: Renford Dills, MD;  Location: [facility] INVASIVE CV LAB;  Service: Cardiovascular;  Laterality: Left;   ABDOMINAL HYSTERECTOMY  1992   APPENDECTOMY  06/1990   AV FISTULA PLACEMENT  07/2009   left upper arm   AV FISTULA PLACEMENT Right 09/06/2016   Procedure: RIGHT FOREARM ARTERIOVENOUS (AV) GRAFT;  Surgeon: Sherren Kerns, MD;  Location: [facility] OR;  Service:  Vascular;  Laterality: Right;   AV FISTULA PLACEMENT N/A 02/24/2017   Procedure: INSERTION OF ARTERIOVENOUS (AV) GORE-TEX GRAFT ARM (BRACHIAL AXILLARY);  Surgeon: Renford Dills, MD;  Location: [facility] ORS;  Service: Vascular;  Laterality: N/A;   AVGG REMOVAL Right 09/06/2016   Procedure: REMOVAL OF Right Arm ARTERIOVENOUS GORETEX GRAFT and Vein Patch angioplasty of brachial artery;  Surgeon: Chuck Hint, MD;  Location: [facility] OR;  Service: Vascular;  Laterality: Right;   BIOPSY  09/26/2019   Procedure:  BIOPSY;  Surgeon: Malissa Hippo, MD;  [facility]: [facility];  Service: Endoscopy;;   BIOPSY  11/01/2022   Procedure: BIOPSY;  Surgeon: Dolores Frame, MD;  [facility]: [facility];  Service: Gastroenterology;;   COLON RESECTION  1992   COLON SURGERY     COLONOSCOPY N/A 03/09/2019   Procedure: COLONOSCOPY;  Surgeon: Malissa Hippo, MD;  [facility]: [facility];  Service: Endoscopy;  Laterality: N/A;   COLONOSCOPY N/A 08/11/2022   Procedure: COLONOSCOPY;  Surgeon: Beverley Fiedler, MD;  Location: [facility] ENDOSCOPY;  Service: Gastroenterology;  Laterality: N/A;   COLONOSCOPY WITH PROPOFOL N/A 06/08/2021   Procedure: COLONOSCOPY WITH PROPOFOL;  Surgeon: Dolores Frame, MD;  [facility]: [facility];  Service: Gastroenterology;  Laterality: N/A;  9:05 /Patient is on dialysis Mon Wed Fri   CORONARY ANGIOPLASTY WITH STENT PLACEMENT  12/15/11   "2"   CORONARY ANGIOPLASTY WITH STENT PLACEMENT  y/2013   "1; makes total of 3" (05/02/2012)   CORONARY ARTERY BYPASS GRAFT  06/13/2012   Procedure: CORONARY ARTERY BYPASS GRAFTING (CABG);  Surgeon: Delight Ovens, MD;  Location: [facility] OR;  Service: Open Heart Surgery;  Laterality: N/A;  cabg x four;  using left internal mammary artery, and left leg greater saphenous vein harvested endoscopically   CORONARY STENT INTERVENTION N/A 10/13/2016   Procedure: Coronary Stent Intervention;  Surgeon: Lennette Bihari, MD;  [facility]: [facility] CV LAB;  Service: Cardiovascular;  Laterality: N/A;   DIALYSIS/PERMA CATHETER REMOVAL N/A 04/18/2017   Procedure: DIALYSIS/PERMA CATHETER REMOVAL;  Surgeon: Renford Dills, MD;  Location: [facility] INVASIVE CV LAB;  Service: Cardiovascular;  Laterality: N/A;   DILATION AND CURETTAGE OF UTERUS     ENTEROSCOPY N/A 06/08/2021   Procedure: PUSH ENTEROSCOPY;  Surgeon: Dolores Frame, MD;  [facility];  Service: Gastroenterology;  Laterality: N/A;   ENTEROSCOPY N/A 11/01/2022   Procedure: ENTEROSCOPY;   Surgeon: Dolores Frame, MD;  [facility];  Service: Gastroenterology;  Laterality: N/A;   ESOPHAGOGASTRODUODENOSCOPY  01/20/2012   Procedure: ESOPHAGOGASTRODUODENOSCOPY (EGD);  Surgeon: Meryl Dare, MD,FACG;  Location: [facility] ENDOSCOPY;  Service: Endoscopy;  Laterality: N/A;   ESOPHAGOGASTRODUODENOSCOPY N/A 03/26/2013   Procedure: ESOPHAGOGASTRODUODENOSCOPY (EGD);  Surgeon: Hilarie Fredrickson, MD;  Location: [facility] ENDOSCOPY;  Service: Endoscopy;  Laterality: N/A;   ESOPHAGOGASTRODUODENOSCOPY N/A 04/30/2015   Procedure: ESOPHAGOGASTRODUODENOSCOPY (EGD);  Surgeon: Malissa Hippo, MD;  [facility];  Service: Endoscopy;  Laterality: N/A;  1pm - moved to 10/20 @ 1:10   ESOPHAGOGASTRODUODENOSCOPY N/A 07/29/2016   Procedure: ESOPHAGOGASTRODUODENOSCOPY (EGD);  Surgeon: Ruffin Frederick, MD;  Location: [facility] ENDOSCOPY;  Service: Gastroenterology;  Laterality: N/A;  enteroscopy   ESOPHAGOGASTRODUODENOSCOPY N/A 09/26/2019   Procedure: ESOPHAGOGASTRODUODENOSCOPY (EGD);  Surgeon: Malissa Hippo, MD;  [facility];  Service: Endoscopy;  Laterality: N/A;  1250   ESOPHAGOGASTRODUODENOSCOPY N/A 08/11/2022   Procedure: ESOPHAGOGASTRODUODENOSCOPY (EGD);  Surgeon: Beverley Fiedler, MD;  Location: [facility] ENDOSCOPY;  Service: Gastroenterology;  Laterality: N/A;   ESOPHAGOGASTRODUODENOSCOPY (EGD) WITH PROPOFOL N/A 02/05/2021   Procedure: ESOPHAGOGASTRODUODENOSCOPY (EGD) WITH PROPOFOL;  Surgeon: Lanelle Bal, DO;  [facility];  Service: Endoscopy;  Laterality: N/A;   ESOPHAGOGASTRODUODENOSCOPY (EGD) WITH PROPOFOL N/A 08/27/2022   Procedure: ESOPHAGOGASTRODUODENOSCOPY (EGD) WITH PROPOFOL;  Surgeon: Lanelle Bal, DO;  [facility];  Service: Endoscopy;  Laterality: N/A;   GIVENS CAPSULE STUDY N/A 03/07/2019   Procedure: GIVENS CAPSULE STUDY;  Surgeon: Malissa Hippo, MD;  [facility];  Service: Endoscopy;  Laterality: N/A;  7:30   GIVENS CAPSULE  STUDY N/A 04/22/2021   Procedure: GIVENS CAPSULE STUDY;  Surgeon: Malissa Hippo, MD;  [facility];  Service: Endoscopy;  Laterality: N/A;  7:30   GIVENS CAPSULE STUDY N/A 08/27/2022   Procedure: GIVENS CAPSULE STUDY;  Surgeon: Lanelle Bal, DO;  [facility];  Service: Endoscopy;  Laterality: N/A;   HOT HEMOSTASIS N/A 08/11/2022   Procedure: HOT HEMOSTASIS (ARGON PLASMA COAGULATION/BICAP);  Surgeon: Beverley Fiedler, MD;  Location: [facility] ENDOSCOPY;  Service: Gastroenterology;  Laterality: N/A;   INSERTION OF DIALYSIS CATHETER N/A 10/05/2020   Procedure: ABORTED TUNNELED DIALYSIS CATHETER PLACEMENT RIGHT INTERNAL JUGULAR VEIN ;  Surgeon: Lucretia Roers, MD;  Location: [facility] ORS;  Service: General;  Laterality: N/A;   INTRAOPERATIVE TRANSESOPHAGEAL ECHOCARDIOGRAM  06/13/2012   Procedure: INTRAOPERATIVE TRANSESOPHAGEAL ECHOCARDIOGRAM;  Surgeon: Delight Ovens, MD;  Location: [facility] OR;  Service: Open Heart Surgery;  Laterality: N/A;   IR DIALY SHUNT INTRO NEEDLE/INTRACATH INITIAL W/IMG LEFT Left 10/06/2020   IR FLUORO GUIDE CV LINE RIGHT  06/17/2020   IR FLUORO GUIDE CV LINE RIGHT  11/12/2022   IR GENERIC HISTORICAL  07/26/2016   IR FLUORO GUIDE CV LINE RIGHT 07/26/2016 Berdine Dance, MD [facility]   IR GENERIC HISTORICAL  07/26/2016   IR US GUIDE VASC ACCESS RIGHT 07/26/2016 Berdine Dance, MD [facility]   IR GENERIC HISTORICAL  08/02/2016   IR US GUIDE VASC ACCESS RIGHT 08/02/2016 Berdine Dance, MD [facility]   IR GENERIC HISTORICAL  08/02/2016   IR FLUORO GUIDE CV LINE RIGHT 08/02/2016 Berdine Dance, MD [facility]   IR RADIOLOGY PERIPHERAL GUIDED IV START  03/28/2017   IR REMOVAL TUN CV CATH W/O FL  08/11/2020   IR REMOVAL TUN CV CATH W/O FL  11/15/2022   IR THROMBECTOMY AV FISTULA W/THROMBOLYSIS INC/SHUNT/IMG LEFT Left 06/17/2020   IR US GUIDE VASC ACCESS LEFT  06/17/2020   IR US GUIDE VASC ACCESS RIGHT  03/28/2017   IR US GUIDE VASC ACCESS RIGHT  06/17/2020   IR US GUIDE VASC  ACCESS RIGHT  11/12/2022   LEFT HEART CATH AND CORONARY ANGIOGRAPHY N/A 09/20/2016   Procedure: Left Heart Cath and Coronary Angiography;  Surgeon: Lyn Records, MD;  Location: [facility] INVASIVE CV LAB;  Service: Cardiovascular;  Laterality: N/A;   LEFT HEART CATH AND CORS/GRAFTS ANGIOGRAPHY N/A 10/13/2016   Procedure: Left Heart Cath and Cors/Grafts Angiography;  Surgeon: Lennette Bihari, MD;  Location: [facility] INVAS[facility] CV LAB;  Service: Cardiovascular;  Laterality: N/A;   LEFT HEART CATH AND CORS/GRAFTS ANGIOGRAPHY N/A 07/13/2018   Procedure: LEFT HEART CATH AND CORS/GRAFTS ANGIOGRAPHY;  Surgeon: Swaziland, Peter M, MD;  Location: [facility] INVASIVE CV LAB;  Service: Cardiovascular;  Laterality: N/A;   LEFT HEART CATH AND CORS/GRAFTS ANGIOGRAPHY N/A 07/22/2021   Procedure: LEFT HEART CATH AND CORS/GRAFTS ANGIOGRAPHY;  Surgeon: Runell Gess, MD;  Location: [facility] INVASIVE CV  LAB;  Service: Cardiovascular;  Laterality: N/A;   LEFT HEART CATHETERIZATION WITH CORONARY ANGIOGRAM N/A 12/15/2011   Procedure: LEFT HEART CATHETERIZATION WITH CORONARY ANGIOGRAM;  Surgeon: Kathleene Hazel, MD;  Location: [facility] CATH LAB;  Service: Cardiovascular;  Laterality: N/A;   LEFT HEART CATHETERIZATION WITH CORONARY ANGIOGRAM N/A 01/10/2012   Procedure: LEFT HEART CATHETERIZATION WITH CORONARY ANGIOGRAM;  Surgeon: Peter M Swaziland, MD;  Location: [facility] CATH LAB;  Service: Cardiovascular;  Laterality: N/A;   LEFT HEART CATHETERIZATION WITH CORONARY ANGIOGRAM N/A 06/08/2012   Procedure: LEFT HEART CATHETERIZATION WITH CORONARY ANGIOGRAM;  Surgeon: Kathleene Hazel, MD;  Location: [facility] CATH LAB;  Service: Cardiovascular;  Laterality: N/A;   LEFT HEART CATHETERIZATION WITH CORONARY/GRAFT ANGIOGRAM N/A 12/10/2013   Procedure: LEFT HEART CATHETERIZATION WITH Isabel Caprice;  Surgeon: Corky Crafts, MD;  Location: [facility] CATH LAB;  Service: Cardiovascular;  Laterality: N/A;   OVARY SURGERY     ovarian cancer   POLYPECTOMY  03/09/2019    Procedure: POLYPECTOMY;  Surgeon: Malissa Hippo, MD;  [facility];  Service: Endoscopy;;  cecal    POLYPECTOMY N/A 09/26/2019   Procedure: DUODENAL POLYPECTOMY;  Surgeon: Malissa Hippo, MD;  [facility];  Service: Endoscopy;  Laterality: N/A;   POLYPECTOMY  08/11/2022   Procedure: POLYPECTOMY;  Surgeon: Beverley Fiedler, MD;  Location: [facility] ENDOSCOPY;  Service: Gastroenterology;;   REVISION OF ARTERIOVENOUS GORETEX GRAFT N/A 02/24/2017   Procedure: REVISION OF ARTERIOVENOUS GORETEX GRAFT (RESECTION);  Surgeon: Renford Dills, MD;  Location: [facility] ORS;  Service: Vascular;  Laterality: N/A;   REVISON OF ARTERIOVENOUS FISTULA Left 06/19/2020   Procedure: REVISION OF LEFT UPPER ARM AV GRAFT WITH INTERPOSITION JUMP GRAFT USING GORE LIMB;  Surgeon: Cephus Shelling, MD;  Location: [facility] OR;  Service: Vascular;  Laterality: Left;   SHUNTOGRAM N/A 10/15/2013   Procedure: Fistulogram;  Surgeon: Nada Libman, MD;  Location: [facility] CATH LAB;  Service: Cardiovascular;  Laterality: N/A;   SUBMUCOSAL TATTOO INJECTION  11/01/2022   Procedure: SUBMUCOSAL TATTOO INJECTION;  Surgeon: Dolores Frame, MD;  [facility];  Service: Gastroenterology;;   THROMBECTOMY / ARTERIOVENOUS GRAFT REVISION  2011   left upper arm   TUBAL LIGATION  1980's   UPPER EXTREMITY ANGIOGRAPHY Bilateral 12/06/2016   Procedure: Upper Extremity Angiography;  Surgeon: Renford Dills, MD;  Location: [facility] INVASIVE CV LAB;  Service: Cardiovascular;  Laterality: Bilateral;   UPPER EXTREMITY INTERVENTION Left 06/06/2017   Procedure: UPPER EXTREMITY INTERVENTION;  Surgeon: Renford Dills, MD;  Location: [facility] INVASIVE CV LAB;  Service: Cardiovascular;  Laterality: Left;   UPPER EXTREMITY VENOGRAPHY Left 10/04/2022   Procedure: UPPER EXTREMITY VENOGRAPHY;  Surgeon: Renford Dills, MD;  Location: [facility] INVASIVE CV LAB;  Service: Cardiovascular;  Laterality: Left;    Family History  Problem  Relation Age of Onset   Heart disease Mother        Heart Disease before age 85   Hyperlipidemia Mother    Hypertension Mother    Diabetes Mother    Heart attack Mother    Heart disease Father        Heart Disease before age 13   Hyperlipidemia Father    Hypertension Father    Diabetes Father    Diabetes Sister    Hypertension Sister    Diabetes Brother    Hyperlipidemia Brother    Heart attack Brother    Hypertension Sister    Heart attack Brother  Colon cancer Child 53   Other Other        noncontributory for early CAD   Esophageal cancer Neg Hx    Liver disease Neg Hx    Kidney disease Neg Hx    Colon polyps Neg Hx     Social History:  reports that she has never smoked. She has never used smokeless tobacco. She reports that she does not drink alcohol and does not use drugs.  Allergies:  Allergies  Allergen Reactions   Amlodipine Swelling   Aspirin Other (See Comments)    High Doses Mess up her stomach; "makes my bowels have blood in them". Takes 81 mg EC Aspirin    Nitrofurantoin Hives   Ranexa [Ranolazine] Other (See Comments)    Myoclonus-hospitalized    Bactrim [Sulfamethoxazole-Trimethoprim] Rash   Contrast Media [Iodinated Contrast Media] Itching   Iron Itching and Other (See Comments)    "they gave me iron in dialysis; had to give me Benadryl cause I had to have the iron" (05/02/2012)   Gabapentin Other (See Comments)    Unknown reaction   Iron Sucrose Other (See Comments)    Unknown   Sucroferric Oxyhydroxide Other (See Comments)    Unknown   Levaquin [Levofloxacin In D5w] Rash   Pantoprazole Rash   Plavix [Clopidogrel Bisulfate] Rash   Protonix [Pantoprazole Sodium] Rash   Venofer [Ferric Oxide] Itching and Other (See Comments)    Patient reports using Benadryl prior to doses as [facility]    Medications: I have reviewed the patient's current medications. Scheduled:  atorvastatin  10 mg Oral QHS   Chlorhexidine Gluconate Cloth  6 each  Topical Q0600   folic acid  1 mg Oral Daily   gabapentin  100 mg Oral QHS   levETIRAcetam  500 mg Oral Daily   levothyroxine  75 mcg Oral QAC breakfast   pantoprazole (PROTONIX) IV  40 mg Intravenous Q12H   sevelamer carbonate  3,200 mg Oral TID WC      Latest Ref Rng & Units 11/30/2022    1:30 AM 11/18/2022    7:57 PM 11/14/2022    8:40 AM  BMP  Glucose 70 - 99 mg/dL 161  096  045   BUN 8 - 23 mg/dL 59  59  76   Creatinine 0.44 - 1.00 mg/dL 4.09  8.11  9.14   Sodium 135 - 145 mmol/L 135  130  127   Potassium 3.5 - 5.1 mmol/L 3.4  3.8  4.8   Chloride 98 - 111 mmol/L 101  95  89   CO2 22 - 32 mmol/L 14  20  22    Calcium 8.9 - 10.3 mg/dL 7.9  8.8  8.5       Latest Ref Rng & Units 11/30/2022    1:30 AM 11/18/2022    7:57 PM 11/14/2022    8:40 AM  CBC  WBC 4.0 - 10.5 K/uL 9.5  11.7  18.7   Hemoglobin 12.0 - 15.0 g/dL 7.1  9.2  8.9   Hematocrit 36.0 - 46.0 % 23.0  27.9  26.0   Platelets 150 - 400 K/uL 261  292  297    No results found.  Review of Systems  Constitutional:  Positive for fatigue. Negative for chills and fever.  HENT:  Negative for congestion, nosebleeds, sinus pressure and sore throat.   Eyes:  Negative for redness and visual disturbance.  Respiratory:  Negative for cough, chest tightness and shortness of breath.  Cardiovascular:  Negative for chest pain and leg swelling.  Gastrointestinal:  Positive for blood in stool and diarrhea. Negative for abdominal distention, abdominal pain, nausea and vomiting.  Endocrine: Negative for polydipsia and polyuria.  Genitourinary:  Negative for dysuria and hematuria.  Musculoskeletal:  Negative for arthralgias, back pain and myalgias.  Neurological:  Positive for weakness and light-headedness. Negative for dizziness and syncope.   Blood pressure (!) 174/63, pulse 63, temperature 98.3 F (36.8 C), resp. rate 18, SpO2 100 %. Physical Exam Vitals and nursing note reviewed.  Constitutional:      General: She is not in acute  distress.    Appearance: Normal appearance. She is normal weight. She is not ill-appearing.  HENT:     Head: Normocephalic and atraumatic.     Right Ear: External ear normal.     Left Ear: External ear normal.     Nose: Nose normal. No congestion.     Mouth/Throat:     Mouth: Mucous membranes are dry.     Pharynx: Oropharynx is clear.  Eyes:     General: No scleral icterus.    Extraocular Movements: Extraocular movements intact.     Conjunctiva/sclera: Conjunctivae normal.  Cardiovascular:     Rate and Rhythm: Normal rate and regular rhythm.     Pulses: Normal pulses.     Heart sounds: Normal heart sounds.     Comments: Left IJ TDC Pulmonary:     Effort: Pulmonary effort is normal.     Breath sounds: Normal breath sounds. No wheezing or rales.  Abdominal:     General: Abdomen is flat. Bowel sounds are normal.     Palpations: Abdomen is soft.     Tenderness: There is no abdominal tenderness.  Musculoskeletal:     Cervical back: Normal range of motion and neck supple.     Right lower leg: No edema.     Left lower leg: No edema.     Comments: Thrombosed left upper arm AV graft  Skin:    General: Skin is warm and dry.  Neurological:     Mental Status: She is alert. Mental status is at baseline.     Assessment/Plan: 1.  Acute blood loss anemia secondary to rectal bleeding: With history of arteriovenous malformations/ angiodysplasia seen on previous EGD.  Status post PRBC transfusion and ongoing reevaluation by gastroenterology with no acute indications at this time for repeat endoscopy/colonoscopy. 2.  End-stage renal disease: Usually on a Monday/Wednesday/Friday schedule and will resume dialysis again today via her left IJ tunneled dialysis catheter.  Dialysis will be heparin free. 3.  Hypokalemia: Likely secondary to diarrhea/GI losses, monitor with hemodialysis (using a 4K bath). 4.  Secondary hyperparathyroidism: Phosphorus level will be added on to earlier drawn labs.   Calcium level at goal.  Resume sevelamer with meals for phosphorus binding. 5.  Hypertension: Blood pressure elevated, resume oral antihypertensive therapy and monitor with ultrafiltration on hemodialysis. 6.  Atrial fibrillation: She is currently rate controlled and appears to be in sinus rhythm, not a candidate for anticoagulation due to recurrent GI bleed.  Wilmoth Rasnic K. 11/30/2022, 10:36 AM

## 2022-12-01 ENCOUNTER — Inpatient Hospital Stay (HOSPITAL_COMMUNITY): Payer: Medicare Other | Admitting: Anesthesiology

## 2022-12-01 ENCOUNTER — Encounter (HOSPITAL_COMMUNITY)
Admission: EM | Disposition: A | Discharge status: Skilled Nursing Facility | Payer: Self-pay | Source: Home / Self Care | Attending: Internal Medicine

## 2022-12-01 ENCOUNTER — Encounter (HOSPITAL_COMMUNITY): Payer: Self-pay | Admitting: Internal Medicine

## 2022-12-01 DIAGNOSIS — K921 Melena: Secondary | ICD-10-CM

## 2022-12-01 DIAGNOSIS — E876 Hypokalemia: Secondary | ICD-10-CM

## 2022-12-01 DIAGNOSIS — F419 Anxiety disorder, unspecified: Secondary | ICD-10-CM

## 2022-12-01 DIAGNOSIS — D649 Anemia, unspecified: Secondary | ICD-10-CM

## 2022-12-01 DIAGNOSIS — K449 Diaphragmatic hernia without obstruction or gangrene: Secondary | ICD-10-CM

## 2022-12-01 DIAGNOSIS — K5521 Angiodysplasia of colon with hemorrhage: Secondary | ICD-10-CM

## 2022-12-01 DIAGNOSIS — K31819 Angiodysplasia of stomach and duodenum without bleeding: Secondary | ICD-10-CM

## 2022-12-01 DIAGNOSIS — L8990 Pressure ulcer of unspecified site, unspecified stage: Secondary | ICD-10-CM

## 2022-12-01 DIAGNOSIS — K5710 Diverticulosis of small intestine without perforation or abscess without bleeding: Secondary | ICD-10-CM | POA: Diagnosis not present

## 2022-12-01 DIAGNOSIS — K5520 Angiodysplasia of colon without hemorrhage: Secondary | ICD-10-CM | POA: Diagnosis not present

## 2022-12-01 DIAGNOSIS — K5711 Diverticulosis of small intestine without perforation or abscess with bleeding: Secondary | ICD-10-CM | POA: Diagnosis not present

## 2022-12-01 DIAGNOSIS — K625 Hemorrhage of anus and rectum: Secondary | ICD-10-CM | POA: Diagnosis not present

## 2022-12-01 HISTORY — PX: ENTEROSCOPY: SHX5533

## 2022-12-01 HISTORY — PX: HOT HEMOSTASIS: SHX5433

## 2022-12-01 LAB — GASTROINTESTINAL PANEL BY PCR, STOOL (REPLACES STOOL CULTURE)
Adenovirus F40/41: NOT DETECTED
Astrovirus: NOT DETECTED
Campylobacter species: NOT DETECTED
Cryptosporidium: NOT DETECTED
Cyclospora cayetanensis: NOT DETECTED
Entamoeba histolytica: NOT DETECTED
Enteroaggregative E coli (EAEC): NOT DETECTED
Enteropathogenic E coli (EPEC): NOT DETECTED
Enterotoxigenic E coli (ETEC): NOT DETECTED
Giardia lamblia: NOT DETECTED
Norovirus GI/GII: NOT DETECTED
Plesimonas shigelloides: NOT DETECTED
Rotavirus A: NOT DETECTED
Salmonella species: NOT DETECTED
Sapovirus (I, II, IV, and V): NOT DETECTED
Shiga like toxin producing E coli (STEC): NOT DETECTED
Shigella/Enteroinvasive E coli (EIEC): NOT DETECTED
Vibrio cholerae: NOT DETECTED
Vibrio species: NOT DETECTED
Yersinia enterocolitica: NOT DETECTED

## 2022-12-01 LAB — CBC
HCT: 23 % — ABNORMAL LOW (ref 36.0–46.0)
Hemoglobin: 7.8 g/dL — ABNORMAL LOW (ref 12.0–15.0)
MCH: 34.5 pg — ABNORMAL HIGH (ref 26.0–34.0)
MCHC: 33.9 g/dL (ref 30.0–36.0)
MCV: 101.8 fL — ABNORMAL HIGH (ref 80.0–100.0)
Platelets: 198 10*3/uL (ref 150–400)
RBC: 2.26 MIL/uL — ABNORMAL LOW (ref 3.87–5.11)
RDW: 24 % — ABNORMAL HIGH (ref 11.5–15.5)
WBC: 6.5 10*3/uL (ref 4.0–10.5)
nRBC: 0.3 % — ABNORMAL HIGH (ref 0.0–0.2)

## 2022-12-01 LAB — BASIC METABOLIC PANEL
Anion gap: 13 (ref 5–15)
BUN: 24 mg/dL — ABNORMAL HIGH (ref 8–23)
CO2: 25 mmol/L (ref 22–32)
Calcium: 7.7 mg/dL — ABNORMAL LOW (ref 8.9–10.3)
Chloride: 98 mmol/L (ref 98–111)
Creatinine, Ser: 4.57 mg/dL — ABNORMAL HIGH (ref 0.44–1.00)
GFR, Estimated: 9 mL/min — ABNORMAL LOW (ref >60)
Glucose, Bld: 78 mg/dL (ref 70–99)
Potassium: 2.8 mmol/L — ABNORMAL LOW (ref 3.5–5.1)
Sodium: 136 mmol/L (ref 135–145)

## 2022-12-01 LAB — GLUCOSE, CAPILLARY
Glucose-Capillary: 68 mg/dL — ABNORMAL LOW (ref 70–99)
Glucose-Capillary: 79 mg/dL (ref 70–99)

## 2022-12-01 LAB — BPAM RBC
ISSUE DATE / TIME: 202405221747
Unit Type and Rh: 5100
Unit Type and Rh: 9500

## 2022-12-01 LAB — TYPE AND SCREEN: ABO/RH(D): O POS

## 2022-12-01 LAB — HEPATITIS B SURFACE ANTIBODY, QUANTITATIVE: Hep B S AB Quant (Post): 261 m[IU]/mL (ref >9.9)

## 2022-12-01 SURGERY — ENTEROSCOPY
Anesthesia: Monitor Anesthesia Care

## 2022-12-01 MED ORDER — SODIUM CHLORIDE 0.9 % IV SOLN
INTRAVENOUS | Status: AC | PRN
  Administered 2022-12-01: 500 mL via INTRAVENOUS

## 2022-12-01 MED ORDER — POTASSIUM CHLORIDE 10 MEQ/100ML IV SOLN
10.0000 meq | Freq: Once | INTRAVENOUS | Status: AC
  Administered 2022-12-01: 10 meq via INTRAVENOUS
  Filled 2022-12-01: qty 100

## 2022-12-01 MED ORDER — DEXTROSE 50 % IV SOLN
INTRAVENOUS | Status: AC
  Filled 2022-12-01: qty 50

## 2022-12-01 MED ORDER — DEXTROSE 50 % IV SOLN
12.5000 g | INTRAVENOUS | Status: AC
  Administered 2022-12-01: 12.5 g via INTRAVENOUS

## 2022-12-01 MED ORDER — PROPOFOL 500 MG/50ML IV EMUL
INTRAVENOUS | Status: DC | PRN
  Administered 2022-12-01: 100 ug/kg/min via INTRAVENOUS

## 2022-12-01 MED ORDER — LIDOCAINE 2% (20 MG/ML) 5 ML SYRINGE
INTRAMUSCULAR | Status: DC | PRN
  Administered 2022-12-01: 20 mg via INTRAVENOUS

## 2022-12-01 NOTE — Progress Notes (Addendum)
PROGRESS NOTE    Shawna [name]  HYQ:[id number] DOB: 08-22-1940 DOA: 11/30/2022 PCP: [facility]    Brief Narrative:   Shawna [name] is a 82 y.o. female with past medical history of end-stage renal disease on hemodialysis Monday, Wednesday, Friday, seizure disorder, paroxysmal atrial fibrillation not on anticoagulation secondary to history of GI bleed, hypertension, hyperlipidemia, CAD status post CABG and PCI, hypothyroidism, small bowel AVM who was recently admitted to the hospital for  recurrent GI bleeding with acute on chronic blood loss anemia, to the hospital with frequent loose bowel movements for last 3 days with some blood when wiping wiping.  In the ED, vitals were stable.  Hemoglobin was p7.1 from 9.2, 12 days back.  FOBT was positive.  1 unit of packed RBC was transfused.  BMP showed potassium of 3.4 with creatinine of 7.8.  [facility] was consulted and was admitted hospital for further evaluation and treatment.    Assessment/Plan  Recurrent rectal bleeding/hx of small bowel AVM, history of peptic ulcer disease.   Remote history of colon cancer s/p resection in the 90s.  GI was consulted and underwent endoscopic evaluation with findings of several AVMs in the stomach and jejunum, which were fulgurated. Likely reason for GIB.  Advance diet today. Monitor for further bleed, check hemoglobin in am.  C. difficile and GI pathogen panel negative.   Acute blood loss anemia, history of anemia of chronic disease and ESRD Latest hemoglobin of 7.1.  FOBT positive.  Status post 1 unit of packed RBC, recent hemoglobin of 7.8.  ESRD on HD MWF nephrology was consulted for hemodialysis.  Getting hemodialysis as per nephrology.   Hypokalemia/High anion gap metabolic acidosis Follows on board.  Received 10 meq of IV potassium.  Volume management/electrolyte management with dialysis.  Will check BMP in p.m. and replenish orally as needed.   Diarrhea.   GI pathogen panel was negative.   C. difficile PCR was negative..  Status post EGD today with AVMs which were ablated.  Coronary artery disease status post CABG and PCI Continue to hold aspirin from home.   Hyperlipidemia On Lipitor.   Hypertension Antihypertensives on hold.   Seizure disorder Continue Keppra.   Pressure injury stage II rectum.  Present on admission.  Continue wound care. Pressure Injury 11/30/22 Rectum Stage 2 -  Partial thickness loss of dermis presenting as a shallow open injury with a red, pink wound bed without slough. open shallow ulcer with pink wound bed (Active)  11/30/22 0640  Location: Rectum  Location Orientation:   Staging: Stage 2 -  Partial thickness loss of dermis presenting as a shallow open injury with a red, pink wound bed without slough.  Wound Description (Comments): open shallow ulcer with pink wound bed  Present on Admission:       DVT prophylaxis: SCDs Start: 11/30/22 0324   Code Status:     Code Status: Full Code  Disposition: Home likely 12/02/2022  Status is: Inpatient Remains inpatient appropriate because: Status post endoscopy with AVM.   Family Communication: None at bedside  Consultants:  GI  Procedures:  Upper GI endoscopy with ablation of gastric and proximal jejunal AVM on 12/01/2022  Antimicrobials:  None  Anti-infectives (From admission, onward)    None       Subjective: Today, patient was seen and examined at bedside, seen after EGD.Marland Kitchen  Patient denies any nausea vomiting abdominal pain fever or chills.  Eating lunch.  Objective: Vitals:   12/01/22 1018 12/01/22 1020  12/01/22 1030 12/01/22 1040  BP: (!) 102/36 108/69 (!) 121/92 (!) 102/59  Pulse: 66 79 83 80  Resp: (!) 24 (!) 32 15 16  Temp: 98.5 F (36.9 C)     TempSrc: Temporal     SpO2: 95% 96% 100% 99%  Weight:        Intake/Output Summary (Last 24 hours) at 12/01/2022 1130 Last data filed at 12/01/2022 0926 Gross per 24 hour  Intake 857.67 ml  Output 1.5 ml  Net 856.17 ml    Filed Weights   11/30/22 1230  Weight: 55.9 kg    Physical Examination: Body mass index is 22.54 kg/m.   General:  Average built, not in obvious distress, alert awake and Communicative, elderly female. HENT: Mild pallor noted.  Oral mucosa is moist.  Chest:  Clear breath sounds.   No crackles or wheezes.  CVS: S1 &S2 heard. No murmur.  Regular rate and rhythm. Abdomen: Soft, nontender, nondistended.  Bowel sounds are heard.   Extremities: No cyanosis, clubbing or edema.  Peripheral pulses are palpable. Psych: Alert, awake and oriented, normal mood CNS:  No cranial nerve deficits.  Power equal in all extremities.   Skin: Warm and dry.  No rashes noted.  Data Reviewed:   CBC: Recent Labs  Lab 11/30/22 0130 12/01/22 0751  WBC 9.5 6.5  NEUTROABS 7.8*  --   HGB 7.1* 7.8*  HCT 23.0* 23.0*  MCV 116.2* 101.8*  PLT 261 198    Basic Metabolic Panel: Recent Labs  Lab 11/30/22 0130 12/01/22 0751  NA 135 136  K 3.4* 2.8*  CL 101 98  CO2 14* 25  GLUCOSE 118* 78  BUN 59* 24*  CREATININE 7.84* 4.57*  CALCIUM 7.9* 7.7*    Liver Function Tests: Recent Labs  Lab 11/30/22 0130  AST 6*  ALT 11  ALKPHOS 62  BILITOT 0.7  PROT 7.1  ALBUMIN 3.3*     Radiology Studies: No results found.    LOS: 1 day     Joycelyn Das, MD [facility] Available via Epic secure chat 7am-7pm After these hours, please refer to coverage provider listed on [email or web address] 12/01/2022, 11:30 AM

## 2022-12-01 NOTE — Op Note (Signed)
[facility] Patient Name: Shawna [name] Procedure Date : 12/01/2022 MRN: [id number] Attending MD: Willaim Rayas. Adela Lank , MD, [id number] Date of Birth: 1941/06/05 CSN: [id number] Age: 82 Admit Type: Inpatient Procedure:                Small bowel enteroscopy Indications:              Melena - dark stool, anemia. History of AVMs,                            history of jejunal polyp (adenoma) reported on last                            enteroscopy April 2024 Providers:                Willaim Rayas. Adela Lank, MD, Adolph Pollack, RN,                            Salley Scarlet, Technician, Ginette Otto, CRNA Referring MD:              Medicines:                Monitored Anesthesia Care Complications:            No immediate complications. Estimated blood loss:                            Minimal. Estimated Blood Loss:     Estimated blood loss was minimal. Procedure:                Pre-Anesthesia Assessment:                           - Prior to the procedure, a History and Physical                            was performed, and patient medications and                            allergies were reviewed. The patient's tolerance of                            previous anesthesia was also reviewed. The risks                            and benefits of the procedure and the sedation                            options and risks were discussed with the patient.                            All questions were answered, and informed consent                            was obtained. Prior Anticoagulants: The patient has  taken no anticoagulant or antiplatelet agents. ASA                            Grade Assessment: IV - A patient with severe                            systemic disease that is a constant threat to life.                            After reviewing the risks and benefits, the patient                            was deemed in satisfactory condition to undergo the                             procedure.                           After obtaining informed consent, the endoscope was                            passed under direct vision. Throughout the                            procedure, the patient's blood pressure, pulse, and                            oxygen saturations were monitored continuously. The                            PCF-H190TL ([id number]) Olympus slim colonoscope was                            introduced through the mouth and advanced to the                            proximal jejunum. The small bowel enteroscopy was                            accomplished without difficulty. The patient                            tolerated the procedure well. Scope In: Scope Out: Findings:      Esophagogastric landmarks were identified: the Z-line was found at 35       cm, the gastroesophageal junction was found at 35 cm and the upper       extent of the gastric folds was found at 37 cm from the incisors.      A 2 cm hiatal hernia was present.      The exam of the esophagus was otherwise normal.      A cluster of a few diminutive angiodysplastic lesions with no bleeding       were found in the gastric body. Fulguration to ablate the lesion to       prevent bleeding by  argon plasma was successful.      The exam of the stomach was otherwise normal.      There was no evidence of significant pathology in the entire examined       duodenum.      A tattoo was seen in the proximal jejunum. In close proximity to this       was a roughly 4mm polyps. I did not appreciate any larger polyps in this       area (however area was extremely spastic)      Mtuliple diverticulum was found in the proximal jejunum, one was quite       large.      Six angiodysplastic lesions with no bleeding were found in the proximal       jejunum BEYOND the previous tattoo that was placed for the polyp.       Fulguration to ablate the lesions to prevent bleeding by argon plasma        was successful.      Exam of the jejunum was otherwise normal. Impression:               - Esophagogastric landmarks identified.                           - 2 cm hiatal hernia.                           - A few non-bleeding angiodysplastic lesions in the                            stomach. Treated with argon plasma coagulation                            (APC).                           - Normal stomach otherwise                           - Normal examined duodenum.                           - A tattoo was seen in the jejunum with a small                            polyp.                           - Jejunal diverticulum - several, one large.                           - Six non-bleeding angiodysplastic lesions in the                            proximal jejunum beyond the tattoo. Treated with                            argon plasma coagulation (APC).  AVMs are the likely cause of bleeding / anemia for                            this patient with ESRD. Ablated. NO active bleeding                            on this exam. Recommendation:           - Return patient to hospital ward for ongoing care.                           - Advance diet as tolerated.                           - Continue present medications.                           - Trend Hgb, monitor for recurrent bleeding                           - Patient can follow up with primary GI MD for                            removal of jejunal polyp at another time                           - Continue octreotide depot injection as outpatient Procedure Code(s):        --- Professional ---                           [phone], Small intestinal endoscopy, enteroscopy                            beyond second portion of duodenum, not including                            ileum; with control of bleeding (eg, injection,                            bipolar cautery, unipolar cautery, laser, heater                            probe,  stapler, plasma coagulator) Diagnosis Code(s):        --- Professional ---                           K31.819, Angiodysplasia of stomach and duodenum                            without bleeding                           K92.1, Melena (includes Hematochezia)                           K57.10, Diverticulosis of  small intestine without                            perforation or abscess without bleeding CPT copyright 2022 American [employer] Association. All rights reserved. The codes documented in this report are preliminary and upon coder review may  be revised to meet current compliance requirements. Viviann Spare P. Emmalie Haigh, MD 12/01/2022 10:25:04 AM This report has been signed electronically. Number of Addenda: 0

## 2022-12-01 NOTE — Progress Notes (Signed)
Pt receives out-pt HD at [facility] on MWF. Pt arrives at 10:30 am for 10:45 am chair time. Clinic states pt is very weak and it appears that pt's husband is having a difficult time meeting pt's needs. This info was provided to [facility] staff for d/c planning purposes. Will assist as needed.   Olivia Canter Renal Navigator [phone]

## 2022-12-01 NOTE — Transfer of Care (Signed)
Immediate Anesthesia Transfer of Care Note  Patient: Shawna [name]  Procedure(s) Performed: ENTEROSCOPY HOT HEMOSTASIS (ARGON PLASMA COAGULATION/BICAP)  Patient Location: Endoscopy Unit  Anesthesia Type:MAC  Level of Consciousness: sedated  Airway & Oxygen Therapy: Patient connected to nasal cannula oxygen  Post-op Assessment: Post -op Vital signs reviewed and stable  Post vital signs: stable  Last Vitals:  Vitals Value Taken Time  BP 102/36 12/01/22 1018  Temp    Pulse 71 12/01/22 1018  Resp 19 12/01/22 1018  SpO2 98 % 12/01/22 1018  Vitals shown include unvalidated device data.  Last Pain:  Vitals:   12/01/22 0831  TempSrc: Temporal  PainSc: 0-No pain         Complications: No notable events documented.

## 2022-12-01 NOTE — Progress Notes (Signed)
Patient husband will pick her up at discharge Delfin Gant

## 2022-12-01 NOTE — Anesthesia Preprocedure Evaluation (Addendum)
Anesthesia Evaluation  Patient identified by MRN, date of birth, ID band Patient awake    Reviewed: Allergy & Precautions, NPO status , Patient's Chart, lab work & pertinent test results, reviewed documented beta blocker date and time   History of Anesthesia Complications Negative for: history of anesthetic complications  Airway Mallampati: III  TM Distance: >3 FB Neck ROM: Full    Dental  (+) Dental Advisory Given, Missing   Pulmonary neg pulmonary ROS   Pulmonary exam normal        Cardiovascular hypertension, Pt. on home beta blockers and Pt. on medications + CAD, + Cardiac Stents, + CABG, + Peripheral Vascular Disease and +CHF  Normal cardiovascular exam+ dysrhythmias Atrial Fibrillation and Ventricular Tachycardia + Valvular Problems/Murmurs MR    '23 Cath -  Mid LM to Dist LM lesion is 60% stenosed.   Prox LAD lesion is 90% stenosed.   Ost Cx to Mid Cx lesion is 99% stenosed.   Ost RCA to Prox RCA lesion is 100% stenosed.   Ost Ramus to Ramus lesion is 80% stenosed.   Ost 1st Diag to 1st Diag lesion is 100% stenosed.   Ost RPDA to RPDA lesion is 75% stenosed.   2nd Mrg lesion is 60% stenosed.   Dist LAD lesion is 70% stenosed.   LIMA and is large.   SVG and is large.   SVG and is normal in caliber.   SVG and is large.   The graft exhibits no disease.   The graft exhibits no disease.   The graft exhibits no disease.   LV end diastolic pressure is mildly elevated.   The left ventricular ejection fraction is 35-45% by visual estimate.  '23 TTE - EF 30 to 35%. There is mild left ventricular hypertrophy. Grade II diastolic dysfunction (pseudonormalization). Right ventricular systolic function is moderately reduced. There is mildly elevated pulmonary artery systolic pressure. Left atrial size was severely dilated. Right atrial size was mildly dilated. Moderate mitral valve regurgitation. Mild to moderate  mitral stenosis. MG at 70bpm, MVA 1.2 cm^2 by continuity equation. Tricuspid valve regurgitation is mild to moderate.      Neuro/Psych  PSYCHIATRIC DISORDERS Anxiety     TIA   GI/Hepatic Neg liver ROS, PUD,GERD  Medicated and Controlled,,  Endo/Other  diabetes, Type 2Hypothyroidism   Hypokalemia, K being replaced    Renal/GU ESRF and DialysisRenal disease     Musculoskeletal  (+) Arthritis ,    Abdominal   Peds  Hematology negative hematology ROS (+)   Anesthesia Other Findings   Reproductive/Obstetrics                             Anesthesia Physical Anesthesia Plan  ASA: 4  Anesthesia Plan: MAC   Post-op Pain Management: Minimal or no pain anticipated   Induction:   PONV Risk Score and Plan: 2 and Propofol infusion and Treatment may vary due to age or medical condition  Airway Management Planned: Nasal Cannula and Natural Airway  Additional Equipment: None  Intra-op Plan:   Post-operative Plan:   Informed Consent: I have reviewed the patients History and Physical, chart, labs and discussed the procedure including the risks, benefits and alternatives for the proposed anesthesia with the patient or authorized representative who has indicated his/her understanding and acceptance.     Consent reviewed with POA  Plan Discussed with: CRNA and Anesthesiologist  Anesthesia Plan Comments: (Consent obtained from husband, Lessly Chiang, via  telephone )        Anesthesia Quick Evaluation

## 2022-12-01 NOTE — Anesthesia Postprocedure Evaluation (Signed)
Anesthesia Post Note  Patient: Shawna [name]  Procedure(s) Performed: ENTEROSCOPY HOT HEMOSTASIS (ARGON PLASMA COAGULATION/BICAP)     Patient location during evaluation: PACU Anesthesia Type: MAC Level of consciousness: awake and alert Pain management: pain level controlled Vital Signs Assessment: post-procedure vital signs reviewed and stable Respiratory status: spontaneous breathing, nonlabored ventilation and respiratory function stable Cardiovascular status: stable and blood pressure returned to baseline Anesthetic complications: no   No notable events documented.  Last Vitals:  Vitals:   12/01/22 1040 12/01/22 1153  BP: (!) 102/59 (!) 136/48  Pulse: 80 66  Resp: 16 18  Temp:  37.1 C  SpO2: 99% 99%    Last Pain:  Vitals:   12/01/22 1153  TempSrc: Oral  PainSc:                  Beryle Lathe

## 2022-12-01 NOTE — Interval H&P Note (Signed)
History and Physical Interval Note: Patient stable overnight. History reviewed. Plan for enteroscopy today to re-evaluate her bleeding symptoms. She has a known adenoma in the jejunum vs. Distal duodenum not removed on the last exam. Will not plan on removing that today unless it is clearly the source for her anemia. She has had ulcers / AVMs in the past causing her anemia. We discussed risks / benefits of the exam and anesthesia with her and she is agreeable and wishes to proceed. Getting some potassium preprocedure per anesthesia  12/01/2022 9:14 AM  Shawna [name]  has presented today for surgery, with the diagnosis of Anemia, dark stool.  The various methods of treatment have been discussed with the patient and family. After consideration of risks, benefits and other options for treatment, the patient has consented to  Procedure(s): ENTEROSCOPY (N/A) as a surgical intervention.  The patient's history has been reviewed, patient examined, no change in status, stable for surgery.  I have reviewed the patient's chart and labs.  Questions were answered to the patient's satisfaction.     Viviann Spare P Mahlia Fernando

## 2022-12-01 NOTE — TOC Progression Note (Signed)
Transition of Care [facility]) - Initial/Assessment Note    Patient Details  Name: Shawna [name] MRN: [id number] Date of Birth: [birth year]  Transition of Care [facility]) CM/SW Contact:    Ralene Bathe, LCSWA Phone Number: 12/01/2022, 4:19 PM  Clinical Narrative:                 LCSW informed by Renal Navigator that, per the patient's dialysis clinic, the patient's spouse is having a difficult time meeting pt's needs and is requesting SNF.  LCSW messaged MD and requested that PT/OT orders be placed so that the patient can be assessed.   TOC following.     Barriers to Discharge: Continued Medical Work up   Patient Goals and CMS Choice Patient states their goals for this hospitalization and ongoing recovery are:: Husband requests rehab then return home. CMS Medicare.gov Compare Post Acute Care list provided to:: Patient Choice offered to / list presented to : Patient, Spouse      Expected Discharge Plan and Services       Living arrangements for the past 2 months: Single Family Home                                      Prior Living Arrangements/Services Living arrangements for the past 2 months: Single Family Home Lives with:: Spouse Patient language and need for interpreter reviewed:: Yes Do you feel safe going back to the place where you live?: Yes      Need for Family Participation in Patient Care: Yes (Comment) Care giver support system in place?: Yes (comment) Current home services: DME (Cane, walker, rollator, shower seat, grab bars, wheelchair.) Criminal Activity/Legal Involvement Pertinent to Current Situation/Hospitalization: No - Comment as needed  Activities of Daily Living Home Assistive Devices/Equipment: Cane (specify quad or straight) ADL Screening (condition at time of admission) Patient's cognitive ability adequate to safely complete daily activities?: Yes Is the patient deaf or have difficulty hearing?: No Does the patient have difficulty seeing, even  when wearing glasses/contacts?: No Does the patient have difficulty concentrating, remembering, or making decisions?: No Patient able to express need for assistance with ADLs?: No Does the patient have difficulty dressing or bathing?: No Independently performs ADLs?: Yes (appropriate for developmental age) Does the patient have difficulty walking or climbing stairs?: No Weakness of Legs: None Weakness of Arms/Hands: None  Permission Sought/Granted Permission sought to share information with : Case Manager, Family Supports Permission granted to share information with : Yes, Verbal Permission Granted              Emotional Assessment Appearance:: Appears stated age Attitude/Demeanor/Rapport: Engaged, Gracious Affect (typically observed): Accepting, Appropriate, Calm, Hopeful, Pleasant Orientation: : Oriented to Self, Oriented to Place Alcohol / Substance Use: Not Applicable Psych Involvement: No (comment)  Admission diagnosis:  Rectal bleeding [K62.5] Symptomatic anemia [D64.9] Diarrhea, unspecified type [R19.7] Patient Active Problem List   Diagnosis Date Noted   Pressure injury of skin 12/01/2022   Rectal bleeding 11/30/2022   Generalized abdominal pain 11/10/2022   Ileus (HCC) 11/04/2022   Periumbilical abdominal pain 11/04/2022   Symptomatic anemia 10/29/2022   Anemia of chronic disease 10/29/2022   Upper GI bleed 10/29/2022   Iron deficiency anemia due to chronic blood loss 08/26/2022   Benign neoplasm of transverse colon 08/11/2022   Benign neoplasm of descending colon 08/11/2022   Benign neoplasm of sigmoid colon 08/11/2022   Angiodysplasia of duodenum  08/11/2022   Delirium 04/22/2022   History of arteriovenous malformation (AVM) 04/22/2022   Acute metabolic encephalopathy 11/09/2021   NSTEMI, initial episode of care (HCC) 10/19/2021   Acute on chronic blood loss anemia 09/09/2021   Ischemic cardiomyopathy    Atrial fibrillation, chronic (HCC) 05/07/2021    Neuropathy 04/23/2021   Anemia associated with chronic renal failure 04/13/2021   Left knee pain 03/15/2021   Moderate protein-calorie malnutrition (HCC) 02/27/2021   Hypokalemia 02/27/2021   COVID-19 virus infection 02/03/2021   Leukocytosis 02/03/2021   Acquired hypothyroidism 02/03/2021   Myositis 12/03/2020   Ataxia 12/02/2020   Diabetes mellitus type 2 in nonobese (HCC) 11/10/2020   Failure to thrive in adult 11/09/2020   Dialysis AV fistula malfunction, initial encounter (HCC)    Jugular vein occlusion, right (HCC)    Failure of surgically constructed arteriovenous fistula (HCC) 10/03/2020   Myoclonus 08/31/2020   Clotted renal dialysis AV graft, initial encounter (HCC)    Hemodialysis-associated hypotension    Irritable bowel syndrome 02/25/2020   Adenomatous duodenal polyp 09/10/2019   History of GI bleed 09/10/2019   Hand steal syndrome (HCC) 08/01/2017   CAD (coronary artery disease) 06/05/2017   Intestinal ischemia (HCC)    Complication of vascular access for dialysis 03/19/2017   Preoperative clearance 01/25/2017   H/O non-ST elevation myocardial infarction (NSTEMI) 10/24/2016   Non-ST elevation (NSTEMI) myocardial infarction [facility])    Heme positive stool    Cardiac arrest [facility])    Palliative care encounter    Goals of care, counseling/discussion    Flash pulmonary edema (HCC) 04/06/2016   History of colon cancer 01/27/2016   History of ovarian cancer 01/27/2016   PAF (paroxysmal atrial fibrillation) (HCC) 10/14/2015   Malignant neoplasm of right ovary (HCC) 10/14/2015   SVT (supraventricular tachycardia) 09/08/2015   Essential hypertension    Dyspnea    Constipation 03/12/2013   Abdominal wall pain in left flank 03/12/2013   Occlusion and stenosis of carotid artery without mention of cerebral infarction 01/24/2013   Hx of CABG 07/05/2012   Carotid artery disease (HCC) 07/05/2012   Mitral regurgitation 06/12/2012   Non-STEMI (non-ST elevated myocardial  infarction) (HCC) 06/08/2012   Chronic combined systolic and diastolic CHF (congestive heart failure) (HCC) 05/02/2012   AVM (arteriovenous malformation) of small bowel, acquired with hemorrhage 01/20/2012   GERD (gastroesophageal reflux disease) 01/09/2012   HLD (hyperlipidemia) 01/05/2012   Atherosclerotic heart disease of native coronary artery without angina pectoris 12/16/2011   Anxiety disorder 05/04/2011   End-stage renal disease on hemodialysis (HCC) 04/29/2011   Gout 04/29/2011   PCP:  Practice, [facility]:   [facility] [address] [place] - [address] [place] [address] Phone: [phone] Fax: [phone]  [facility] (ESRD Bundle Only) - [place], [place] - [address] [place] [address] Phone: [phone] Fax: [phone]  [facility] - [address] [place], [place] - [address] [facility] [address] [place] [address] Phone: [phone] Fax: [phone]  [facility] [address] [place] [address] Phone: [phone] Fax: [phone]     Social Determinants of Health (SDOH) Social History: SDOH Screenings   Food Insecurity: No Food Insecurity (10/29/2022)  Housing: Low Risk  (10/29/2022)  Transportation Needs: No Transportation Needs (10/29/2022)  Utilities: Not At Risk (10/29/2022)  Financial Resource Strain: Low Risk  (12/14/2018)  Physical Activity: Unknown (12/14/2018)  Social Connections: Unknown (12/14/2018)  Stress: No Stress Concern  Present (12/14/2018)  Tobacco Use: Low Risk  (12/01/2022)   SDOH Interventions: Transportation Interventions: Intervention Not Indicated, Inpatient TOC, Patient Resources (Friends/Family)   Readmission Risk Interventions    10/31/2022   12:45 PM 08/29/2022    4:47 PM 11/12/2021    8:56 AM  Readmission Risk Prevention Plan  Transportation Screening Complete Complete Complete  Medication Review [facility]) Complete Complete  Complete  PCP or Specialist appointment within 3-5 days of discharge  Complete Complete  HRI or Home Care Consult Complete Complete Complete  SW Recovery Care/Counseling Consult Complete Complete Complete  Palliative Care Screening Not Applicable Not Applicable Not Applicable  Skilled Nursing Facility Not Applicable Complete Complete

## 2022-12-01 NOTE — Progress Notes (Signed)
Hypoglycemic Event  CBG: 68  Treatment: D50 25 mL (12.5 gm)  Symptoms:  Somnolence  Follow-up CBG: Time:0913 CBG Result:79  Possible Reasons for Event: Inadequate meal intake  Comments/MD notified:MDA Mal Amabile notified. VO for 12.5 g D50 IV push. Medication given as ordered. MDA Brock notified of repeat CBG. No new orders.    Shawna [name] 12/01/22 9:28 AM

## 2022-12-02 DIAGNOSIS — Z8719 Personal history of other diseases of the digestive system: Secondary | ICD-10-CM

## 2022-12-02 DIAGNOSIS — Z992 Dependence on renal dialysis: Secondary | ICD-10-CM

## 2022-12-02 DIAGNOSIS — L89302 Pressure ulcer of unspecified buttock, stage 2: Secondary | ICD-10-CM | POA: Diagnosis not present

## 2022-12-02 DIAGNOSIS — K5521 Angiodysplasia of colon with hemorrhage: Secondary | ICD-10-CM | POA: Diagnosis not present

## 2022-12-02 DIAGNOSIS — N186 End stage renal disease: Secondary | ICD-10-CM | POA: Diagnosis not present

## 2022-12-02 DIAGNOSIS — K625 Hemorrhage of anus and rectum: Secondary | ICD-10-CM | POA: Diagnosis not present

## 2022-12-02 DIAGNOSIS — E876 Hypokalemia: Secondary | ICD-10-CM | POA: Diagnosis not present

## 2022-12-02 DIAGNOSIS — Z86010 Personal history of colonic polyps: Secondary | ICD-10-CM

## 2022-12-02 LAB — RENAL FUNCTION PANEL
Albumin: 2.3 g/dL — ABNORMAL LOW (ref 3.5–5.0)
Anion gap: 13 (ref 5–15)
BUN: 41 mg/dL — ABNORMAL HIGH (ref 8–23)
CO2: 24 mmol/L (ref 22–32)
Calcium: 7.5 mg/dL — ABNORMAL LOW (ref 8.9–10.3)
Chloride: 95 mmol/L — ABNORMAL LOW (ref 98–111)
Creatinine, Ser: 6.83 mg/dL — ABNORMAL HIGH (ref 0.44–1.00)
GFR, Estimated: 6 mL/min — ABNORMAL LOW (ref >60)
Glucose, Bld: 87 mg/dL (ref 70–99)
Phosphorus: 5.3 mg/dL — ABNORMAL HIGH (ref 2.5–4.6)
Potassium: 3.1 mmol/L — ABNORMAL LOW (ref 3.5–5.1)
Sodium: 132 mmol/L — ABNORMAL LOW (ref 135–145)

## 2022-12-02 LAB — CBC
HCT: 22.3 % — ABNORMAL LOW (ref 36.0–46.0)
Hemoglobin: 7.4 g/dL — ABNORMAL LOW (ref 12.0–15.0)
MCH: 35.4 pg — ABNORMAL HIGH (ref 26.0–34.0)
MCHC: 33.2 g/dL (ref 30.0–36.0)
MCV: 106.7 fL — ABNORMAL HIGH (ref 80.0–100.0)
Platelets: 187 10*3/uL (ref 150–400)
RBC: 2.09 MIL/uL — ABNORMAL LOW (ref 3.87–5.11)
RDW: 23.8 % — ABNORMAL HIGH (ref 11.5–15.5)
WBC: 7.1 10*3/uL (ref 4.0–10.5)
nRBC: 0 % (ref 0.0–0.2)

## 2022-12-02 MED ORDER — CARVEDILOL 3.125 MG PO TABS
3.1250 mg | ORAL_TABLET | Freq: Two times a day (BID) | ORAL | Status: DC
  Administered 2022-12-02 – 2022-12-04 (×3): 3.125 mg via ORAL
  Filled 2022-12-02 (×3): qty 1

## 2022-12-02 MED ORDER — HEPARIN SODIUM (PORCINE) 1000 UNIT/ML IJ SOLN
INTRAMUSCULAR | Status: AC
  Filled 2022-12-02: qty 4

## 2022-12-02 MED ORDER — AMIODARONE HCL 200 MG PO TABS
100.0000 mg | ORAL_TABLET | Freq: Every day | ORAL | Status: DC
  Administered 2022-12-02 – 2022-12-13 (×12): 100 mg via ORAL
  Filled 2022-12-02 (×12): qty 1

## 2022-12-02 MED ORDER — HEPARIN SODIUM (PORCINE) 1000 UNIT/ML IJ SOLN
3800.0000 [IU] | Freq: Once | INTRAMUSCULAR | Status: AC
  Administered 2022-12-07: 3800 [IU]
  Filled 2022-12-02: qty 4

## 2022-12-02 MED ORDER — METOPROLOL TARTRATE 5 MG/5ML IV SOLN: 5.0000 mg | INTRAVENOUS | Status: DC | PRN

## 2022-12-02 NOTE — Progress Notes (Signed)
Patient ID: Shawna [name], female   DOB: Jul 01, 1941, 82 y.o.   MRN: [id number] [facility] Progress Note   Assessment/ Plan:   1.  Acute blood loss anemia secondary to rectal bleeding: With EGD yesterday and APC of jejunal arteriovenous malformations/ angiodysplasia by Dr. Adela Lank. She does not recall any hematochezia from overnight and has not had BM this AM. Hgb slightly lower this AM but hemodynamically stable.  2.  End-stage renal disease: Usually on a Monday/Wednesday/Friday schedule and ongoing dialysis today via her left IJ tunneled dialysis catheter.  Will continue to follow for dialysis needs.  3.  Hypokalemia: Likely secondary to limited intake- will monitor with hemodialysis (using a 4K bath). 4.  Secondary hyperparathyroidism: Phosphorus level will be added on to earlier drawn labs.  Calcium level at goal.  Resumed sevelamer with meals for phosphorus binding. 5.  Hypertension: Blood pressure elevated, resumed on oral antihypertensive therapy and monitor with ultrafiltration on hemodialysis. 6.  Atrial fibrillation: She is currently rate controlled and appears to be in sinus rhythm, not a candidate for anticoagulation due to recurrent GI bleed.  Subjective:   Denies any acute events overnight including hematochezia. Underwent EGD yesterday with argon laser photocoagulation of jejunal angiodysplasias.    Objective:   BP (!) 156/60   Pulse 64   Temp 98.3 F (36.8 C)   Resp 20   Wt 56.9 kg   SpO2 99%   BMI 22.94 kg/m   Physical Exam: WUJ:[id number] resting in dialysis, awake/alert CVS: Pulse regular rhythm and normal rate, no murmur Resp:Clear to auscultation bilaterally, no rales/rhonchi. LIJ TDC connected to HD AOZ:HYQM, flat, non-tender, bowel sounds normal Ext:No lower extremity edema. Thrombosed  LUA AVG  Labs: BMET Recent Labs  Lab 11/30/22 0130 12/01/22 0751 12/02/22 0800  NA 135 136 132*  K 3.4* 2.8* 3.1*  CL 101 98 95*  CO2 14* 25 24   GLUCOSE 118* 78 87  BUN 59* 24* 41*  CREATININE 7.84* 4.57* 6.83*  CALCIUM 7.9* 7.7* 7.5*  PHOS  --   --  5.3*   CBC Recent Labs  Lab 11/30/22 0130 12/01/22 0751 12/02/22 0800  WBC 9.5 6.5 7.1  NEUTROABS 7.8*  --   --   HGB 7.1* 7.8* 7.4*  HCT 23.0* 23.0* 22.3*  MCV 116.2* 101.8* 106.7*  PLT 261 198 187      Medications:     atorvastatin  10 mg Oral QHS   Chlorhexidine Gluconate Cloth  6 each Topical Q0600   folic acid  1 mg Oral Daily   gabapentin  100 mg Oral QHS   levETIRAcetam  500 mg Oral Daily   levothyroxine  75 mcg Oral QAC breakfast   pantoprazole (PROTONIX) IV  40 mg Intravenous Q12H   sevelamer carbonate  3,200 mg Oral TID WC   Zetta Bills, MD 12/02/2022, 9:41 AM

## 2022-12-02 NOTE — Progress Notes (Signed)
Progress Note  Primary GI: Dr. Levon Hedger  LOS: 2 days   Chief Complaint: Rectal bleeding, anemia, history AVMs   Subjective   Patient states she is doing well today.  Had a bowel movement this morning that was brown.  Denies melena/hematochezia.  Denies nausea, vomiting, abdominal pain.   Objective   Vital signs in last 24 hours: Temp:  [98.3 F (36.8 C)-100.6 F (38.1 C)] 98.3 F (36.8 C) (05/24 0755) Pulse Rate:  [55-71] 66 (05/24 1100) Resp:  [16-23] 17 (05/24 1100) BP: (112-177)/(44-66) 156/58 (05/24 1100) SpO2:  [97 %-100 %] 100 % (05/24 1100) Weight:  [56.9 kg] 56.9 kg (05/24 0755) Last BM Date : 11/30/22 Last BM recorded by nurses in past 5 days Stool Type: Type 6 (Mushy consistency with ragged edges) (11/30/2022  7:00 PM)  General:   female in no acute distress  Heart:  Regular rate and rhythm; no murmurs Pulm: Clear anteriorly; no wheezing Abdomen: soft, nondistended, normal bowel sounds in all quadrants. Nontender without guarding. No organomegaly appreciated. Neurologic:  Alert and  oriented x4;  No focal deficits.  Psych:  Cooperative. Normal mood and affect.  Intake/Output from previous day: 05/23 0701 - 05/24 0700 In: 580 [P.O.:480; IV Piggyback:100] Out: 0  Intake/Output this shift: No intake/output data recorded.  Studies/Results: No results found.  Lab Results: Recent Labs    11/30/22 0130 12/01/22 0751 12/02/22 0800  WBC 9.5 6.5 7.1  HGB 7.1* 7.8* 7.4*  HCT 23.0* 23.0* 22.3*  PLT 261 198 187   BMET Recent Labs    11/30/22 0130 12/01/22 0751 12/02/22 0800  NA 135 136 132*  K 3.4* 2.8* 3.1*  CL 101 98 95*  CO2 14* 25 24  GLUCOSE 118* 78 87  BUN 59* 24* 41*  CREATININE 7.84* 4.57* 6.83*  CALCIUM 7.9* 7.7* 7.5*   LFT Recent Labs    11/30/22 0130 12/02/22 0800  PROT 7.1  --   ALBUMIN 3.3* 2.3*  AST 6*  --   ALT 11  --   ALKPHOS 62  --   BILITOT 0.7  --    PT/INR No results for input(s): "LABPROT", "INR" in the last 72  hours.   Scheduled Meds:  atorvastatin  10 mg Oral QHS   Chlorhexidine Gluconate Cloth  6 each Topical Q0600   folic acid  1 mg Oral Daily   gabapentin  100 mg Oral QHS   heparin sodium (porcine)       heparin sodium (porcine)  3,800 Units Intracatheter Once   levETIRAcetam  500 mg Oral Daily   levothyroxine  75 mcg Oral QAC breakfast   pantoprazole (PROTONIX) IV  40 mg Intravenous Q12H   sevelamer carbonate  3,200 mg Oral TID WC   Continuous Infusions:    Impression:   GI bleed, history of small bowel AVMs Multifactorial anemia -Small bowel enteroscopy 12/01/2022: 2 cm hiatal hernia, few nonbleeding angiodysplastic lesions in stomach (treated with APC), normal stomach, normal duodenum.  Tattoo seen in jejunum with small polyp.  Jejunal diverticulum (several).  6 nonbleeding angiodysplastic lesions in the proximal jejunum beyond the tattoo (treated with APC) -Hgb 7.4, stable (7.8 yesterday)  ESRD on HD  Remote colon cancer, s/p resection in 1990's / history of adenomatous colon polyps on last colonoscopy in Aug 2020, including a large transverse colon polyp.    Jejunal tubular adenoma ( left (intact and area tattooed on enteroscopy 11/02/22)   Plan:   -Continue octreotide Depo injection as outpatient -  hgb 7.4, stable.  Continue daily CBC and monitor Hgb.  Transfuse as needed to keep Hgb above 7. -Continue Protonix 40 Mg IV twice daily -Continue supportive care   Shawna [name] Leanna Sato  12/02/2022, 11:26 AM

## 2022-12-02 NOTE — Progress Notes (Signed)
PT Cancellation Note  Patient Details Name: Shawna [name] MRN: [id number] DOB: 11-13-1940   Cancelled Treatment:    Reason Eval/Treat Not Completed: Patient at procedure or test/unavailable (Pt off unit at HD, will follow up at later date/time as schedule allows and pt able.)   Renaldo Fiddler PT, DPT Acute Rehabilitation Services Office [phone]  12/02/22 10:33 AM

## 2022-12-02 NOTE — Progress Notes (Signed)
MEWS Progress Note  Patient Details Name: Shawna [name] MRN: [id number] DOB: 08-Jul-1941 Today's Date: 12/02/2022   MEWS Flowsheet Documentation:  Assess: MEWS Score Temp: 98.4 F (36.9 C) BP: (!) 123/94 MAP (mmHg): 105 Pulse Rate: (!) 55 ECG Heart Rate: 81 Resp: 20 Level of Consciousness: Alert SpO2: 97 % O2 Device: Room Air Patient Activity (if Appropriate): In bed O2 Flow Rate (L/min): 2 L/min Assess: MEWS Score MEWS Temp: 0 MEWS Systolic: 0 MEWS Pulse: 0 MEWS RR: 0 MEWS LOC: 0 MEWS Score: 0 MEWS Score Color: Green Assess: SIRS CRITERIA SIRS Temperature : 0 SIRS Respirations : 0 SIRS Pulse: 0 SIRS WBC: 0 SIRS Score Sum : 0 SIRS Temperature : 0 SIRS Pulse: 0 SIRS Respirations : 0 SIRS WBC: 0 SIRS Score Sum : 0 Assess: if the MEWS score is Yellow or Red Were vital signs taken at a resting state?: Yes Focused Assessment: Change from prior assessment (see assessment flowsheet) Does the patient meet 2 or more of the SIRS criteria?: No MEWS guidelines implemented : Yes, yellow Treat MEWS Interventions: Considered administering scheduled or prn medications/treatments as ordered Take Vital Signs Increase Vital Sign Frequency : Yellow: Q2hr x1, continue Q4hrs until patient remains green for 12hrs Escalate MEWS: Escalate: Yellow: Discuss with charge nurse and consider notifying provider and/or RRT Notify: Charge Nurse/RN Name of Charge Nurse/RN Notified: [facility] Provider Notification Provider Name/Title: MD Pokrel Date Provider Notified: 12/02/22 Time Provider Notified: 1518 Method of Notification: Page Notification Reason: Change in status Provider response: See new orders Date of Provider Response: 12/02/22 Time of Provider Response: 1521      Ashyra Cantin K Georgian Mcclory 12/02/2022, 4:03 PM

## 2022-12-02 NOTE — Procedures (Signed)
Patient seen on Hemodialysis. BP (!) 156/60   Pulse 64   Temp 98.3 F (36.8 C)   Resp 20   Wt 56.9 kg   SpO2 99%   BMI 22.94 kg/m   QB 400, UF goal 1L Tolerating treatment without complaints at this time.   Zetta Bills MD [facility] Office # [phone] Pager # [phone] 9:50 AM

## 2022-12-02 NOTE — Progress Notes (Signed)
   12/02/22 1150  Vitals  Temp (!) 97.4 F (36.3 C)  Pulse Rate 68  Resp 18  BP (!) 150/66  SpO2 100 %  O2 Device Room Air  Weight 55.6 kg  Type of Weight Post-Dialysis  Oxygen Therapy  Patient Activity (if Appropriate) In bed  Pulse Oximetry Type Continuous  Oximetry Probe Site Changed No  Post Treatment  Dialyzer Clearance Lightly streaked  Duration of HD Treatment -hour(s) 3.5 hour(s)  Hemodialysis Intake (mL) 0 mL  Liters Processed 84  Fluid Removed (mL) 1000 mL  Tolerated HD Treatment Yes   Received patient in bed to unit.  Alert and oriented.  Informed consent signed and in chart.   TX duration:3.5  Patient tolerated well.  Transported back to the room  Alert, without acute distress.  Hand-off given to patient's nurse.   Access used: DLC--- Access issues: no complications  Total UF removed: 1000 Medication(s) given: none   [facility]

## 2022-12-02 NOTE — Telephone Encounter (Signed)
Patient has a follow up appointment with Brooke Bonito, Np on 12/06/2022. ( Made her aware the referral is being worked on by [facility].

## 2022-12-02 NOTE — Progress Notes (Signed)
OT Cancellation Note  Patient Details Name: Shawna [name] MRN: [id number] DOB: Sep 19, 1940   Cancelled Treatment:    Reason Eval/Treat Not Completed: Patient at procedure or test/ unavailable (HD currently)  Mateo Flow 12/02/2022, 7:46 AM

## 2022-12-02 NOTE — Progress Notes (Signed)
PROGRESS NOTE    BRADEE ZHAI  ZOX:[id number] DOB: 11-10-1940 DOA: 11/30/2022 PCP: [facility]    Brief Narrative:   Shawna [name] is a 82 y.o. female with past medical history of end-stage renal disease on hemodialysis Monday, Wednesday, Friday, seizure disorder, paroxysmal atrial fibrillation not on anticoagulation secondary to history of GI bleed, hypertension, hyperlipidemia, CAD status post CABG and PCI, hypothyroidism, small bowel AVM who was recently admitted to the hospital for  recurrent GI bleeding with acute on chronic blood loss anemia, to the hospital with frequent loose bowel movements for last 3 days with some blood when wiping wiping.  In the ED, vitals were stable.  Hemoglobin was p7.1 from 9.2, 12 days back.  FOBT was positive.  1 unit of packed RBC was transfused.  BMP showed potassium of 3.4 with creatinine of 7.8.  [facility] was consulted and was admitted hospital for further evaluation and treatment.    During hospitalization, patient underwent EGD with fulguration of multiple AVMs in the stomach and duodenum.  Family has requested to skilled nursing facility placement at this time.  Physical therapy has been consulted.  Assessment/Plan  Recurrent rectal bleeding/hx of small bowel AVM, history of peptic ulcer disease.   Remote history of colon cancer s/p resection in the 90s.  GI was consulted and underwent endoscopic evaluation with findings of several AVMs in the stomach and jejunum, which were fulgurated.  AVMs likely the reason for the GI bleed.Marland Kitchen  Has been advanced and patient has remained stable.  Will monitor for further bleeding. C. difficile and GI pathogen panel negative.   Acute blood loss anemia, history of anemia of chronic disease and ESRD Latest hemoglobin of 7.4 from 7.1.  FOBT positive.  Status post 1 unit of packed RBC.  Status post endoscopic evaluation with treatment of AVMs.  ESRD on HD MWF Nephrology on board for hemodialysis.  Seen  during hemodialysis today.  Hypokalemia/High anion gap metabolic acidosis  Volume management/electrolyte management with dialysis.  Will check BMP in AM.  Diarrhea.   GI pathogen panel was negative.  C. difficile PCR was negative.  Status post EGD  with AVMs which were ablated.  No further episodes.  Coronary artery disease status post CABG and PCI Continue to hold aspirin from home.   Hyperlipidemia On Lipitor.   Hypertension Antihypertensives on hold.   Seizure disorder Continue Keppra.   Pressure injury stage II rectum.  Present on admission.  Continue wound care. Pressure Injury 11/30/22 Rectum Stage 2 -  Partial thickness loss of dermis presenting as a shallow open injury with a red, pink wound bed without slough. open shallow ulcer with pink wound bed (Active)  11/30/22 0640  Location: Rectum  Location Orientation:   Staging: Stage 2 -  Partial thickness loss of dermis presenting as a shallow open injury with a red, pink wound bed without slough.  Wound Description (Comments): open shallow ulcer with pink wound bed  Present on Admission:      Deconditioning, debility. Patient has been states that she has been having weakness and fatigue.  Will receive PT OT evaluation.  Might need rehabilitation.   DVT prophylaxis: SCDs Start: 11/30/22 0324   Code Status:     Code Status: Full Code  Disposition: Uncertain, PT OT evaluation pending.  Family requesting skilled nursing facility.  Status is: Inpatient  Remains inpatient appropriate because: Status post endoscopy with AVM, possible need for rehabilitation..   Family Communication:  Spoke with the patient's  husband on the phone and updated her about the clinical condition of the patient.  Consultants:  GI  Procedures:  Upper GI endoscopy with ablation of gastric and proximal jejunal AVM on 12/01/2022  Antimicrobials:  None  Anti-infectives (From admission, onward)    None      Subjective: Today, patient  was seen and examined at bedside.  Seen during hemodialysis.  Denies any nausea vomiting fever, chills or rigor.  Complains of fatigue and weakness.  Was able to tolerate oral diet.    Objective: Vitals:   12/02/22 0930 12/02/22 1000 12/02/22 1030 12/02/22 1100  BP: (!) 156/60 (!) 157/55 (!) 167/66 (!) 156/58  Pulse: 64 67 69 66  Resp: 20 20 17 17   Temp:      TempSrc:      SpO2: 99% 97% 100% 100%  Weight:        Intake/Output Summary (Last 24 hours) at 12/02/2022 1101 Last data filed at 12/02/2022 0010 Gross per 24 hour  Intake 480 ml  Output 0 ml  Net 480 ml    Filed Weights   11/30/22 1230 12/02/22 0755  Weight: 55.9 kg 56.9 kg    Physical Examination: Body mass index is 22.94 kg/m.   General:  Average built, not in obvious distress, Communicative, elderly female, HENT:   Pallor noted.  Oral mucosa is moist.  Subclavian hemodialysis catheter in place. Chest:  Clear breath sounds.  Diminished breath sounds bilaterally. No crackles or wheezes.  CABG scar present. CVS: S1 &S2 heard. No murmur.  Regular rate and rhythm. Abdomen: Soft, nontender, nondistended.  Bowel sounds are heard.   Extremities: No cyanosis, clubbing or edema.   Left upper extremity fistula in place. Psych: Alert, awake and oriented, normal mood CNS:  No cranial nerve deficits.  Power equal in all extremities.   Skin: Warm and dry.  No rashes noted.  Pressure injury stage II over  rectum, present on admission.   Data Reviewed:   CBC: Recent Labs  Lab 11/30/22 0130 12/01/22 0751 12/02/22 0800  WBC 9.5 6.5 7.1  NEUTROABS 7.8*  --   --   HGB 7.1* 7.8* 7.4*  HCT 23.0* 23.0* 22.3*  MCV 116.2* 101.8* 106.7*  PLT 261 198 187     Basic Metabolic Panel: Recent Labs  Lab 11/30/22 0130 12/01/22 0751 12/02/22 0800  NA 135 136 132*  K 3.4* 2.8* 3.1*  CL 101 98 95*  CO2 14* 25 24  GLUCOSE 118* 78 87  BUN 59* 24* 41*  CREATININE 7.84* 4.57* 6.83*  CALCIUM 7.9* 7.7* 7.5*  PHOS  --   --  5.3*      Liver Function Tests: Recent Labs  Lab 11/30/22 0130 12/02/22 0800  AST 6*  --   ALT 11  --   ALKPHOS 62  --   BILITOT 0.7  --   PROT 7.1  --   ALBUMIN 3.3* 2.3*      Radiology Studies: No results found.    LOS: 2 days     Joycelyn Das, MD [facility] Available via Epic secure chat 7am-7pm After these hours, please refer to coverage provider listed on [email or web address] 12/02/2022, 11:01 AM

## 2022-12-02 NOTE — Evaluation (Signed)
Physical Therapy Evaluation Patient Details Name: Shawna [name] MRN: [id number] DOB: September 26, 1940 Today's Date: 12/02/2022  History of Present Illness  82 yo female admitted due to rectal bleed and x4 weeks of diarrhea.  PMH ESRD on HD MWF, seizure disorder, afib, HTN, HLD, CAD s/p CABG and PCI, hypothyroidism, , Small bowel AVMS, GIB, Hx of colon CA, TIA, PUD, Gout.   Clinical Impression  Shawna [name] is 82 y.o. female admitted with above HPI and diagnosis. Patient is currently limited by functional impairments below (see PT problem list). Patient lives with spouse and is mod I with SPC and occasional use of RW at home, she requires assist for ADL's from spouse at baseline. Currently pt requires min assist for transfers and gait with RW.Patient will benefit from continued skilled PT interventions to address impairments and progress independence with mobility. Acute PT will follow and progress as able.        Recommendations for follow up therapy are one component of a multi-disciplinary discharge planning process, led by the attending physician.  Recommendations may be updated based on patient status, additional functional criteria and insurance authorization.  Follow Up Recommendations       Assistance Recommended at Discharge Frequent or constant Supervision/Assistance  Patient can return home with the following  A little help with walking and/or transfers;A little help with bathing/dressing/bathroom;Assistance with cooking/housework;Help with stairs or ramp for entrance;Assist for transportation;Direct supervision/assist for medications management    Equipment Recommendations None recommended by PT  Recommendations for Other Services       Functional Status Assessment Patient has had a recent decline in their functional status and demonstrates the ability to make significant improvements in function in a reasonable and predictable amount of time.     Precautions / Restrictions  Precautions Precautions: Fall Restrictions Weight Bearing Restrictions: No      Mobility  Bed Mobility Overal bed mobility: Needs Assistance Bed Mobility: Supine to Sit     Supine to sit: HOB elevated, Supervision     General bed mobility comments: sup for safety, pt using bed features.    Transfers Overall transfer level: Needs assistance Equipment used: Rolling walker (2 wheels) Transfers: Sit to/from Stand Sit to Stand: Min guard           General transfer comment: close guarding for safety.    Ambulation/Gait Ambulation/Gait assistance: Min guard, Min assist   Assistive device: Rolling walker (2 wheels) Gait Pattern/deviations: Step-through pattern, Decreased stride length, Trunk flexed, Drifts right/left Gait velocity: decreased     General Gait Details: cueing for upright posture and proximity to RW as pt has tendency to advance walker too far ahead. inermittent minA for RW management and to guide turns.  Stairs            Wheelchair Mobility    Modified Rankin (Stroke Patients Only)       Balance Overall balance assessment: Needs assistance Sitting-balance support: Feet supported, Bilateral upper extremity supported Sitting balance-Leahy Scale: Fair     Standing balance support: Bilateral upper extremity supported, During functional activity, Reliant on assistive device for balance Standing balance-Leahy Scale: Poor Standing balance comment: RW and min guard/min A                             Pertinent Vitals/Pain Pain Assessment Pain Assessment: No/denies pain    Home Living Family/patient expects to be discharged to:: Private residence Living Arrangements: Spouse/significant other Available Help at  Discharge: Family;Available 24 hours/day Type of Home: House Home Access: Stairs to enter Entrance Stairs-Rails: Right Entrance Stairs-Number of Steps: 2   Home Layout: One level Home Equipment: Rollator (4 wheels);Cane -  single point;Shower seat;BSC/3in1 Additional Comments: Per patient; patient appears to answer questions appropriately but.    Prior Function Prior Level of Function : Needs assist             Mobility Comments: Pt typically uses her hurri-cane to ambulate in house but will occasionally use rollator.  Spouse assist with stairs. ADLs Comments: Spouse provides min A for ADLs and total assist for IADLs     Hand Dominance   Dominant Hand: Right    Extremity/Trunk Assessment   Upper Extremity Assessment Upper Extremity Assessment: Generalized weakness;Defer to OT evaluation    Lower Extremity Assessment Lower Extremity Assessment: Generalized weakness    Cervical / Trunk Assessment Cervical / Trunk Assessment: Normal  Communication   Communication: No difficulties  Cognition Arousal/Alertness: Awake/alert Behavior During Therapy: WFL for tasks assessed/performed Overall Cognitive Status: History of cognitive impairments - at baseline                                 General Comments: follows commands well and is oriented, pt focused on getting lunch and singing gospel throughout. very pleasant.        General Comments      Exercises     Assessment/Plan    PT Assessment Patient needs continued PT services  PT Problem List Decreased strength;Cardiopulmonary status limiting activity;Decreased range of motion;Decreased activity tolerance;Decreased balance;Decreased mobility;Decreased knowledge of precautions;Decreased cognition;Decreased knowledge of use of DME       PT Treatment Interventions DME instruction;Gait training;Balance training;Stair training;Functional mobility training;Therapeutic activities;Patient/family education;Therapeutic exercise;Neuromuscular re-education;Cognitive remediation    PT Goals (Current goals can be found in the Care Plan section)  Acute Rehab PT Goals Patient Stated Goal: get better and back to family PT Goal Formulation:  With patient Time For Goal Achievement: 12/16/22 Potential to Achieve Goals: Good    Frequency Min 3X/week     Co-evaluation               AM-PAC PT "6 Clicks" Mobility  Outcome Measure Help needed turning from your back to your side while in a flat bed without using bedrails?: A Little Help needed moving from lying on your back to sitting on the side of a flat bed without using bedrails?: A Little Help needed moving to and from a bed to a chair (including a wheelchair)?: A Little Help needed standing up from a chair using your arms (e.g., wheelchair or bedside chair)?: A Little Help needed to walk in hospital room?: A Little Help needed climbing 3-5 steps with a railing? : A Lot 6 Click Score: 17    End of Session Equipment Utilized During Treatment: Gait belt Activity Tolerance: Patient tolerated treatment well Patient left: in chair;with call bell/phone within reach;with chair alarm set Nurse Communication: Mobility status PT Visit Diagnosis: Other abnormalities of gait and mobility (R26.89);Muscle weakness (generalized) (M62.81);Difficulty in walking, not elsewhere classified (R26.2)    Time: 1340-1403 PT Time Calculation (min) (ACUTE ONLY): 23 min   Charges:   PT Evaluation $PT Eval Low Complexity: 1 Low PT Treatments $Gait Training: 8-22 mins       Wynn Maudlin, DPT Acute Rehabilitation Services Office [phone]  12/02/22 4:12 PM

## 2022-12-03 DIAGNOSIS — K5521 Angiodysplasia of colon with hemorrhage: Secondary | ICD-10-CM | POA: Diagnosis not present

## 2022-12-03 DIAGNOSIS — K625 Hemorrhage of anus and rectum: Secondary | ICD-10-CM | POA: Diagnosis not present

## 2022-12-03 LAB — BASIC METABOLIC PANEL
Anion gap: 10 (ref 5–15)
BUN: 26 mg/dL — ABNORMAL HIGH (ref 8–23)
CO2: 26 mmol/L (ref 22–32)
Calcium: 8.1 mg/dL — ABNORMAL LOW (ref 8.9–10.3)
Chloride: 97 mmol/L — ABNORMAL LOW (ref 98–111)
Creatinine, Ser: 4.1 mg/dL — ABNORMAL HIGH (ref 0.44–1.00)
GFR, Estimated: 10 mL/min — ABNORMAL LOW (ref >60)
Glucose, Bld: 79 mg/dL (ref 70–99)
Potassium: 3.5 mmol/L (ref 3.5–5.1)
Sodium: 133 mmol/L — ABNORMAL LOW (ref 135–145)

## 2022-12-03 LAB — CBC
HCT: 23 % — ABNORMAL LOW (ref 36.0–46.0)
Hemoglobin: 7.5 g/dL — ABNORMAL LOW (ref 12.0–15.0)
MCH: 34.4 pg — ABNORMAL HIGH (ref 26.0–34.0)
MCHC: 32.6 g/dL (ref 30.0–36.0)
MCV: 105.5 fL — ABNORMAL HIGH (ref 80.0–100.0)
Platelets: 202 10*3/uL (ref 150–400)
RBC: 2.18 MIL/uL — ABNORMAL LOW (ref 3.87–5.11)
RDW: 23.1 % — ABNORMAL HIGH (ref 11.5–15.5)
WBC: 6.9 10*3/uL (ref 4.0–10.5)
nRBC: 0 % (ref 0.0–0.2)

## 2022-12-03 LAB — MAGNESIUM: Magnesium: 1.4 mg/dL — ABNORMAL LOW (ref 1.7–2.4)

## 2022-12-03 NOTE — Progress Notes (Signed)
PROGRESS NOTE    Shawna [name]  ZOX:[id number] DOB: 05-04-1941 DOA: 11/30/2022 PCP: [facility]    Brief Narrative:   Shawna [name] is a 82 y.o. female with past medical history of end-stage renal disease on hemodialysis Monday, Wednesday, Friday, seizure disorder, paroxysmal atrial fibrillation not on anticoagulation secondary to history of GI bleed, hypertension, hyperlipidemia, CAD status post CABG and PCI, hypothyroidism, small bowel AVM who was recently admitted to the hospital for  recurrent GI bleeding with acute on chronic blood loss anemia, to the hospital with frequent loose bowel movements for last 3 days with some blood when wiping wiping.  In the ED, vitals were stable.  Hemoglobin was p7.1 from 9.2, 12 days back.  FOBT was positive.  1 unit of packed RBC was transfused.  BMP showed potassium of 3.4 with creatinine of 7.8.  [facility] was consulted and was admitted hospital for further evaluation and treatment.    During hospitalization, patient underwent EGD with fulguration of multiple AVMs in the stomach and duodenum.  Family has requested to skilled nursing facility placement at this time.  Physical therapy has been consulted.  12/03/2022: Patient seen alongside patient's sister-in-law and niece.  No further bleeding reported.  PT OT input is appreciated.  Transition of care team to assist with disposition.  Assessment/Plan Recurrent rectal bleeding/hx of small bowel AVM, history of peptic ulcer disease.   Remote history of colon cancer s/p resection in the 90s.  GI was consulted and underwent endoscopic evaluation with findings of several AVMs in the stomach and jejunum, which were fulgurated.  AVMs likely the reason for the GI bleed.Marland Kitchen  Has been advanced and patient has remained stable.  Will monitor for further bleeding. C. difficile and GI pathogen panel negative. 12/03/2022: No further bleeding reported.  GI input is highly appreciated.   Acute blood loss anemia,  history of anemia of chronic disease and ESRD Latest hemoglobin of 7.4 from 7.1.  FOBT positive.  Status post 1 unit of packed RBC.  Status post endoscopic evaluation with treatment of AVMs. 12/03/2022: No further bleeding reported.  H/H remained stable.  Hemoglobin today is 7.5 g/dL.  ESRD on HD MWF Nephrology on board for hemodialysis.  12/03/2022: Nephrology team is directing care.  Nephrology input is highly appreciated.  Hypokalemia/High anion gap metabolic acidosis Volume management/electrolyte management with dialysis.   12/03/2022: Potassium today is 3.5.  Continue to monitor.  Diarrhea.   GI pathogen panel was negative.  C. difficile PCR was negative.  Status post EGD  with AVMs which were ablated.  No further episodes.  Coronary artery disease status post CABG and PCI Continue to hold aspirin from home.   Hyperlipidemia On Lipitor.   Hypertension Antihypertensives on hold.   Seizure disorder Continue Keppra.   Pressure injury stage II rectum.  Present on admission.  Continue wound care. Pressure Injury 11/30/22 Rectum Stage 2 -  Partial thickness loss of dermis presenting as a shallow open injury with a red, pink wound bed without slough. open shallow ulcer with pink wound bed (Active)  11/30/22 0640  Location: Rectum  Location Orientation:   Staging: Stage 2 -  Partial thickness loss of dermis presenting as a shallow open injury with a red, pink wound bed without slough.  Wound Description (Comments): open shallow ulcer with pink wound bed  Present on Admission:      Deconditioning, debility. Patient has been states that she has been having weakness and fatigue.  Will receive PT  OT evaluation.  Might need rehabilitation.   DVT prophylaxis: SCDs Start: 11/30/22 0324   Code Status:     Code Status: Full Code  Disposition: Uncertain, patient may need short-term rehab.  Status is: Inpatient  Remains inpatient appropriate because: Status post endoscopy with AVM,  possible need for rehabilitation..   Family Communication:  Sister-in-law and niece.    Consultants:  GI  Procedures:  Upper GI endoscopy with ablation of gastric and proximal jejunal AVM on 12/01/2022  Antimicrobials:  None  Anti-infectives (From admission, onward)    None      Subjective: -No new complaints. -No diarrhea. -No fever or chills. -No shortness of breath or chest pain.    Objective: Vitals:   12/02/22 2013 12/03/22 0614 12/03/22 0939 12/03/22 1600  BP: (!) 158/46 (!) 142/44 (!) 123/43   Pulse: 70 71 65   Resp: 17 16 19 17   Temp: 98.9 F (37.2 C) 99.2 F (37.3 C) 98.7 F (37.1 C)   TempSrc:  Oral Oral   SpO2: 96% 96% 98%   Weight:        Intake/Output Summary (Last 24 hours) at 12/03/2022 1627 Last data filed at 12/03/2022 1300 Gross per 24 hour  Intake 480 ml  Output --  Net 480 ml    Filed Weights   11/30/22 1230 12/02/22 0755 12/02/22 1150  Weight: 55.9 kg 56.9 kg 55.6 kg    Physical Examination: Body mass index is 22.42 kg/m.   General: Not in any distress.  Awake and alert. HEENT: Patient is pale.  No jaundice. Neck: Supple. CVS: S1-S2. Lungs: Decreased air entry. Neuro: Awake and alert.  Moves all extremities. Extremities: Mild ankle edema bilaterally.    Data Reviewed:   CBC: Recent Labs  Lab 11/30/22 0130 12/01/22 0751 12/02/22 0800 12/03/22 0301  WBC 9.5 6.5 7.1 6.9  NEUTROABS 7.8*  --   --   --   HGB 7.1* 7.8* 7.4* 7.5*  HCT 23.0* 23.0* 22.3* 23.0*  MCV 116.2* 101.8* 106.7* 105.5*  PLT 261 198 187 202     Basic Metabolic Panel: Recent Labs  Lab 11/30/22 0130 12/01/22 0751 12/02/22 0800 12/03/22 0301  NA 135 136 132* 133*  K 3.4* 2.8* 3.1* 3.5  CL 101 98 95* 97*  CO2 14* 25 24 26   GLUCOSE 118* 78 87 79  BUN 59* 24* 41* 26*  CREATININE 7.84* 4.57* 6.83* 4.10*  CALCIUM 7.9* 7.7* 7.5* 8.1*  MG  --   --   --  1.4*  PHOS  --   --  5.3*  --      Liver Function Tests: Recent Labs  Lab 11/30/22 0130  12/02/22 0800  AST 6*  --   ALT 11  --   ALKPHOS 62  --   BILITOT 0.7  --   PROT 7.1  --   ALBUMIN 3.3* 2.3*      Radiology Studies: No results found.    LOS: 3 days   Time spent: 35 minutes.   Barnetta Chapel, MD [facility] Available via Epic secure chat 7am-7pm After these hours, please refer to coverage provider listed on [email or web address] 12/03/2022, 4:27 PM

## 2022-12-03 NOTE — Progress Notes (Signed)
Patient ID: Shawna [name], female   DOB: 1941/01/11, 82 y.o.   MRN: [id number] [facility] Progress Note   Assessment/ Plan:   1.  Acute blood loss anemia secondary to rectal bleeding: With EGD yesterday and APC of jejunal arteriovenous malformations/ angiodysplasia by Dr. Adela Lank.  Without recurrence of hematochezia/melena overnight and with stable hemoglobin and hematocrit. 2.  End-stage renal disease: Usually on a Monday/Wednesday/Friday schedule and underwent hemodialysis uneventfully yesterday.  With stable hemoglobin/hematocrit and no recurrence of hematochezia along with resolution of diarrhea, appears stable from renal standpoint to discharge home.  3.  Hypokalemia: Likely secondary to limited intake- will monitor with hemodialysis (using a 4K bath). 4.  Secondary hyperparathyroidism: Phosphorus level will be added on to earlier drawn labs.  Calcium level at goal.  Resumed sevelamer with meals for phosphorus binding. 5.  Hypertension: Blood pressure elevated, resumed on oral antihypertensive therapy and monitor with ultrafiltration on hemodialysis. 6.  Atrial fibrillation: She is currently rate controlled and appears to be in sinus rhythm, not a candidate for anticoagulation due to recurrent GI bleed.  Subjective:   Had 2 episodes of diarrhea yesterday for which she was placed on enteric precautions.  No bowel movements overnight or this morning.  Denies hematochezia or melena.  C. difficile antigen positive on 5/22 with negative C. difficile toxin or PCR.  GI panel negative.   Objective:   BP (!) 123/43 (BP Location: Right Arm)   Pulse 65   Temp 98.7 F (37.1 C) (Oral)   Resp 19   Wt 55.6 kg   SpO2 98%   BMI 22.42 kg/m   Physical Exam: Gen: Up in recliner, watching television CVS: Pulse regular rhythm and normal rate, no murmur Resp:Clear to auscultation bilaterally, no rales/rhonchi. LIJ TDC with intact dressing HQI:ONGE, flat, non-tender, bowel sounds  normal Ext:No lower extremity edema. Thrombosed  LUA AVG  Labs: BMET Recent Labs  Lab 11/30/22 0130 12/01/22 0751 12/02/22 0800 12/03/22 0301  NA 135 136 132* 133*  K 3.4* 2.8* 3.1* 3.5  CL 101 98 95* 97*  CO2 14* 25 24 26   GLUCOSE 118* 78 87 79  BUN 59* 24* 41* 26*  CREATININE 7.84* 4.57* 6.83* 4.10*  CALCIUM 7.9* 7.7* 7.5* 8.1*  PHOS  --   --  5.3*  --    CBC Recent Labs  Lab 11/30/22 0130 12/01/22 0751 12/02/22 0800 12/03/22 0301  WBC 9.5 6.5 7.1 6.9  NEUTROABS 7.8*  --   --   --   HGB 7.1* 7.8* 7.4* 7.5*  HCT 23.0* 23.0* 22.3* 23.0*  MCV 116.2* 101.8* 106.7* 105.5*  PLT 261 198 187 202      Medications:     amiodarone  100 mg Oral Daily   atorvastatin  10 mg Oral QHS   carvedilol  3.125 mg Oral BID WC   Chlorhexidine Gluconate Cloth  6 each Topical Q0600   folic acid  1 mg Oral Daily   gabapentin  100 mg Oral QHS   heparin sodium (porcine)  3,800 Units Intracatheter Once   levETIRAcetam  500 mg Oral Daily   levothyroxine  75 mcg Oral QAC breakfast   pantoprazole (PROTONIX) IV  40 mg Intravenous Q12H   sevelamer carbonate  3,200 mg Oral TID WC   Zetta Bills, MD 12/03/2022, 9:53 AM

## 2022-12-03 NOTE — Evaluation (Signed)
Occupational Therapy Evaluation Patient Details Name: Shawna [name] MRN: [id number] DOB: [birth year] Today's Date: 12/03/2022   History of Present Illness 82 yo female admitted due to rectal bleed and x4 weeks of diarrhea.  PMH ESRD on HD MWF, seizure disorder, afib, HTN, HLD, CAD s/p CABG and PCI, hypothyroidism, , Small bowel AVMS, GIB, Hx of colon CA, TIA, PUD, Gout.   Clinical Impression   Pt currently at min to min guard assist for selfcare tasks and toileting sit to stand.  PTA she lives with her spouse and completes most of her ADL tasks at modified independent.  Feel she will benefit from acute care OT at this time to help progress ADL independence to a more independent level for return home with spouse, who can assist as needed.       Recommendations for follow up therapy are one component of a multi-disciplinary discharge planning process, led by the attending physician.  Recommendations may be updated based on patient status, additional functional criteria and insurance authorization.   Assistance Recommended at Discharge Intermittent Supervision/Assistance  Patient can return home with the following A little help with walking and/or transfers;Assistance with cooking/housework;Direct supervision/assist for financial management;Direct supervision/assist for medications management;Assist for transportation;Help with stairs or ramp for entrance;A little help with bathing/dressing/bathroom    Functional Status Assessment  Patient has had a recent decline in their functional status and demonstrates the ability to make significant improvements in function in a reasonable and predictable amount of time.  Equipment Recommendations  None recommended by OT       Precautions / Restrictions Restrictions Weight Bearing Restrictions: No      Mobility Bed Mobility Overal bed mobility: Needs Assistance Bed Mobility: Supine to Sit     Supine to sit: Supervision           Transfers Overall transfer level: Needs assistance Equipment used: Rolling walker (2 wheels) Transfers: Sit to/from Stand, Bed to chair/wheelchair/BSC Sit to Stand: Min guard     Step pivot transfers: Min assist     General transfer comment: Mod instructional cueing to turn the RW around with her when sitting and not to leave it to the side.      Balance Overall balance assessment: Needs assistance Sitting-balance support: Feet supported, Bilateral upper extremity supported Sitting balance-Leahy Scale: Fair     Standing balance support: Bilateral upper extremity supported, During functional activity, Reliant on assistive device for balance Standing balance-Leahy Scale: Poor Standing balance comment: RW needed for balance with mobility                           ADL either performed or assessed with clinical judgement   ADL Overall ADL's : Needs assistance/impaired Eating/Feeding: Independent;Sitting Eating/Feeding Details (indicate cue type and reason): simulated Grooming: Supervision/safety;Sitting;Wash/dry hands       Lower Body Bathing: Minimal assistance;Sit to/from stand Lower Body Bathing Details (indicate cue type and reason): simulated     Lower Body Dressing: Minimal assistance;Sit to/from stand   Toilet Transfer: Minimal assistance;Ambulation;Rolling walker (2 wheels)   Toileting- Clothing Manipulation and Hygiene: Minimal assistance;Sit to/from stand       Functional mobility during ADLs: Minimal assistance;Rollator (4 wheels) General ADL Comments: Pt reports having assist from her spouse when needed, but mostly she was able to complete ADLs on her on and only used her cane in the house. Has shower seat and 3:1 she used PTA.     Vision Baseline Vision/History: 1 Wears  glasses (did not have them present with her at this time) Ability to See in Adequate Light: 0 Adequate Vision Assessment?: No apparent visual deficits                Hand Dominance Right   Extremity/Trunk Assessment Upper Extremity Assessment Upper Extremity Assessment: Generalized weakness (bilateral shoulder flexion 0-90 degrees)   Lower Extremity Assessment Lower Extremity Assessment: Defer to PT evaluation       Communication Communication Communication: No difficulties   Cognition Arousal/Alertness: Awake/alert Behavior During Therapy: WFL for tasks assessed/performed Overall Cognitive Status: History of cognitive impairments - at baseline                                 General Comments: Able to recall 2/3 items without cueing after 1 minute delay, recalled the 3rd with min questioning cueing.  Oriented to place, month, day of week, and year.                Home Living Family/patient expects to be discharged to:: Private residence Living Arrangements: Spouse/significant other Available Help at Discharge: Family;Available 24 hours/day Type of Home: House Home Access: Stairs to enter [employer] of Steps: 2 Entrance Stairs-Rails: Right Home Layout: One level     Bathroom Shower/Tub: Chief Strategy Officer: Standard Bathroom Accessibility: Yes   Home Equipment: Rollator (4 wheels);Cane - single point;Shower seat;BSC/3in1   Additional Comments: Per patient; patient appears to answer questions appropriately but.      Prior Functioning/Environment Prior Level of Function : Needs assist             Mobility Comments: Pt typically uses her hurri-cane to ambulate in house but will occasionally use rollator.  Spouse assist with stairs. ADLs Comments: Spouse provides min A for ADLs and total assist for IADLs        OT Problem List: Decreased strength;Decreased range of motion;Decreased activity tolerance;Impaired balance (sitting and/or standing);Decreased cognition;Decreased safety awareness;Cardiopulmonary status limiting activity      OT Treatment/Interventions:  Self-care/ADL training;Therapeutic exercise;Energy conservation;DME and/or AE instruction;Therapeutic activities;Patient/family education;Balance training    OT Goals(Current goals can be found in the care plan section) Acute Rehab OT Goals Patient Stated Goal: Pt did not state but pleasant and cooperative OT Goal Formulation: With patient Time For Goal Achievement: 12/17/22 Potential to Achieve Goals: Good  OT Frequency: Min 2X/week       AM-PAC OT "6 Clicks" Daily Activity     Outcome Measure Help from another person eating meals?: None Help from another person taking care of personal grooming?: A Little Help from another person toileting, which includes using toliet, bedpan, or urinal?: A Little Help from another person bathing (including washing, rinsing, drying)?: A Little Help from another person to put on and taking off regular upper body clothing?: A Little Help from another person to put on and taking off regular lower body clothing?: A Little 6 Click Score: 19   End of Session Equipment Utilized During Treatment: Rolling walker (2 wheels) Nurse Communication: Mobility status  Activity Tolerance: Patient tolerated treatment well Patient left: in chair;with call bell/phone within reach  OT Visit Diagnosis: Unsteadiness on feet (R26.81);Other abnormalities of gait and mobility (R26.89);Muscle weakness (generalized) (M62.81);Pain Pain - Right/Left: Right Pain - part of body:  (foot)                Time: 1610-9604 OT Time Calculation (min): 35 min Charges:  OT General Charges $OT Visit: 1 Visit OT Evaluation $OT Eval Moderate Complexity: 1 Mod OT Treatments $Self Care/Home Management : 8-22 mins Perrin Maltese, OTR/L Acute Rehabilitation Services  Office [phone] 12/03/2022

## 2022-12-04 ENCOUNTER — Inpatient Hospital Stay (HOSPITAL_COMMUNITY): Payer: Medicare Other

## 2022-12-04 ENCOUNTER — Encounter (HOSPITAL_COMMUNITY): Payer: Self-pay | Admitting: Gastroenterology

## 2022-12-04 DIAGNOSIS — R509 Fever, unspecified: Secondary | ICD-10-CM | POA: Diagnosis not present

## 2022-12-04 LAB — TYPE AND SCREEN
Antibody Screen: NEGATIVE
Unit division: 0

## 2022-12-04 LAB — BPAM RBC: Blood Product Expiration Date: 202406122359

## 2022-12-04 MED ORDER — CARVEDILOL 6.25 MG PO TABS
6.2500 mg | ORAL_TABLET | Freq: Two times a day (BID) | ORAL | Status: DC
  Administered 2022-12-04 – 2022-12-13 (×14): 6.25 mg via ORAL
  Filled 2022-12-04 (×16): qty 1

## 2022-12-04 MED ORDER — HYDRALAZINE HCL 50 MG PO TABS
50.0000 mg | ORAL_TABLET | Freq: Three times a day (TID) | ORAL | Status: DC
  Administered 2022-12-04 – 2022-12-13 (×20): 50 mg via ORAL
  Filled 2022-12-04 (×22): qty 1

## 2022-12-04 NOTE — Plan of Care (Signed)
  Problem: Health Behavior/Discharge Planning: Goal: Ability to manage health-related needs will improve Outcome: Progressing   Problem: Clinical Measurements: Goal: Ability to maintain clinical measurements within normal limits will improve Outcome: Progressing Goal: Will remain free from infection Outcome: Progressing Goal: Diagnostic test results will improve Outcome: Progressing Goal: Respiratory complications will improve Outcome: Progressing Goal: Cardiovascular complication will be avoided Outcome: Progressing   Problem: Activity: Goal: Risk for activity intolerance will decrease Outcome: Progressing   Problem: Nutrition: Goal: Adequate nutrition will be maintained Outcome: Progressing   Problem: Coping: Goal: Level of anxiety will decrease Outcome: Progressing   Problem: Elimination: Goal: Will not experience complications related to bowel motility Outcome: Progressing Goal: Will not experience complications related to urinary retention Outcome: Progressing   Problem: Pain Managment: Goal: General experience of comfort will improve Outcome: Progressing   Problem: Safety: Goal: Ability to remain free from injury will improve Outcome: Progressing   Problem: Skin Integrity: Goal: Risk for impaired skin integrity will decrease Outcome: Progressing   

## 2022-12-04 NOTE — Progress Notes (Deleted)
GI Office Note    Referring Provider: [facility] Primary Care Physician:  [facility] Primary Gastroenterologist: Dolores Frame, MD   Date:  12/06/2022  ID:  Shawna [name], DOB May 10, 1941, MRN [id number]   Chief Complaint   No chief complaint on file.  History of Present Illness  Shawna [name] is a 82 y.o. female with a history of  dementia, ESRD on HD, Afib on ASA, CAD, heart failure, small bowel AVMs, recurrent GI bleeding, colon cancer s/p resection in 1992, IDA, duodenal adenoma presenting today for hospital follow up.   Admission to [facility] in January 2024 for anemia. Underwent EGD and colonoscopy 08/11/2022. 1 small bowel AVM ablated, small duodenal polyp removed (duodenal adenoma), colonoscopy with healthy-appearing anastomosis x 2, 6 polyps removed in the transverse descending sigmoid colons, tubular adenomas on pathology.   Seen by our service during hospitalization 08/27/22 for anemia. Patient with baseline dementia therefore history difficult to obtain. No melena, brbpr, abdominal pain. Hgb 6.6. on admission and received 2u PRBC. Underwent EGD and VCE as noted below. Advised to consider octreotide subcu outpatient.    Procedure history: 07/2016 EGD for anemia: -prior duodenal AVMs, recent NSTEMI and pending cardiac cath.  2 cm hiatal hernia.  Normal stomach and esophagus.  Solitary duodenal versus jejunal polyp was not removed.  Nonbleeding jejunal AVM treated with APC and may have been source for anemia.   02/2019 colonoscopy.  For evaluation gastrointestinal bleeding: -Pandiverticulosis.  Patent ileocolonic anastomosis.  Removed 2 small polyps (TAs) from ascending colon.  Piecemeal resection using a lift technique of large polyp (TA) in transverse colon.   09/2019 EGD for evaluation duodenal polyp: Esophagus, Z-line normal.  3 cm hiatal hernia.  Normal stomach and duodenal bulb.  Solitary, diminutive, sessile polyp at D2 was biopsied (adenoma wo  HGD).  Exam completed to third duodenum.   02/05/2021 EGD for melena, blood loss anemia: -Showed mild severity esophageal candidiasis without bleeding.  Gastritis, nonbleeding.  Normal examined duodenum to D2.  Treated with Diflucan for 14 days and continue daily oral PPI.   04/22/2021 VCE: -Successful, complete study.  Small, flat polyp in either second or third portion duodenum with no stigmata of bleeding.  2 polyps at distal small bowel, 1 small size the other polyp medium.  No bleeding stigmata in either of these polyps.  Dr Karilyn Cota rec push enteroscopy.     06/08/2021 colonoscopy, enteroscopy were cxld due to IV infiltration RUE.  LUE not amenable to IV as her fistula is in that arm..  Plans for outpt PICC line and proceed with GI procedures in January 2022, but procedures never happened. Meds include low-dose aspirin.  Prilosec 20 mg/day, Zofran prn.  Folic acid. Mircera, last dose 1/22.    08/11/2022 EGD: -2 cm hiatal hernia, normal esophagus otherwise, duodenal AVM status post APC, 5 mm duodenal polyp removed (duodenal adenoma)   08/11/2022 colonoscopy: -end-to-end ileocolonic anastomosis ascending colon, end-to-side colocolonic anastomosis rectosigmoid colon, normal neoterminal ileum, 6 polyps removed in the transverse, descending, sigmoid colons.  Tubular adenomas on pathology   EGD 08/27/2022: -multiple gastric AVMs treated with APC, small hiatal hernia, no active or stigmata of bleeding. VCE placed   VCE 08/27/22 read 08/28/22 -A few small AVMs in the small bowel, likely jejunum.  2 small polyps in the distal small bowel previously noted.  No active or stigmata of bleeding.  Capsule appears to sit at the ileocolonic anastomosis for the last 2 to 3 hours of study.  KUB 08/28/22 showed capsule in RLQ   KUB 08/29/22 with capsule likely within the rectum  (lower midline pelvis)   Hgb 10.6 on 09/19/22 (stable since discharge)  Hospital admission 10/28/22-11/15/22. Initially presented to [facility]  with weakness and Hgb 7.1. She was transfused with blood and underwent enteroscopy 4/23. Hospital course also complicated by potential seizure and abdominal pain. She was ultimately transferred to [facility] to obtain IV access to undergo CTA for evaluation of mesenteric ischemia given her abdominal pain. CTA was recommended by our service due to the abdominal pain but high suspicion for abdominal pain being secondary to octreotide.   Enteroscopy 4/23: -non bleeding gastric ulcer with clean base -2cm hiatal hernia -normal duodenum -jejunal polyps s/p biopsy and tattoo -nonbleeding jejunal diverticula  CT abdomen/pelvis no contrast 4/24: Ileus versus mild partial obstruction, distended gallbladder.  RUQ ultrasound 4/25: No biliary ductal dilatation-gallbladder wall is thickened-no gallbladder stones.  Negative Murphy sign.  [place] mesenteric arteries 5/3: Elevated peak systolic velocity in the superior mesenteric artery suspicious for significant stenosis.  No current facility-administered medications for this visit.   No current outpatient medications on file.   Facility-Administered Medications Ordered in Other Visits  Medication Dose Route Frequency Provider Last Rate Last Admin   acetaminophen (TYLENOL) tablet 650 mg  650 mg Oral Q6H PRN Dow Adolph N, DO   650 mg at 12/06/22 0015   amiodarone (PACERONE) tablet 100 mg  100 mg Oral Daily Pokhrel, Laxman, MD   100 mg at 12/05/22 1319   atorvastatin (LIPITOR) tablet 10 mg  10 mg Oral QHS Dow Adolph N, DO   10 mg at 12/05/22 2145   carvedilol (COREG) tablet 6.25 mg  6.25 mg Oral BID WC Berton Mount I, MD   6.25 mg at 12/05/22 1709   Chlorhexidine Gluconate Cloth 2 % PADS 6 each  6 each Topical Q0600 Zetta Bills, MD   6 each at 12/05/22 0751   folic acid (FOLVITE) tablet 1 mg  1 mg Oral Daily Dow Adolph N, DO   1 mg at 12/05/22 1319   gabapentin (NEURONTIN) capsule 100 mg  100 mg Oral QHS Dow Adolph N, DO   100 mg at 12/05/22 2144    guaiFENesin (MUCINEX) 12 hr tablet 600 mg  600 mg Oral BID PRN Berton Mount I, MD   600 mg at 12/05/22 1832   heparin sodium (porcine) injection 3,800 Units  3,800 Units Intracatheter Once Zetta Bills, MD       hydrALAZINE (APRESOLINE) injection 10 mg  10 mg Intravenous Q6H PRN Pokhrel, Laxman, MD   10 mg at 11/30/22 1845   hydrALAZINE (APRESOLINE) tablet 50 mg  50 mg Oral Q8H Berton Mount I, MD   50 mg at 12/06/22 0523   hydrOXYzine (ATARAX) tablet 25 mg  25 mg Oral Q6H PRN Dow Adolph N, DO       levETIRAcetam (KEPPRA) tablet 500 mg  500 mg Oral Daily Dow Adolph N, DO   500 mg at 12/05/22 1319   levothyroxine (SYNTHROID) tablet 75 mcg  75 mcg Oral QAC breakfast Darlin Drop, DO   75 mcg at 12/05/22 0740   pantoprazole (PROTONIX) injection 40 mg  40 mg Intravenous Q12H Dow Adolph N, DO   40 mg at 12/05/22 2145   prochlorperazine (COMPAZINE) injection 5 mg  5 mg Intravenous Q6H PRN Dow Adolph N, DO       sevelamer carbonate (RENVELA) tablet 3,200 mg  3,200 mg Oral TID WC Darlin Drop, DO  3,200 mg at 12/05/22 1709    Past Medical History:  Diagnosis Date   Acute on chronic respiratory failure with hypoxia (HCC) 10/10/2016   Anxiety    Arthritis    AVM (arteriovenous malformation) of colon    CAD (coronary artery disease)    a. s/p CABG in 2013 b. DES to D1 in 10/2016. c. cath in 07/2018 showing patent grafts with occlusion of D1 at prior stent site and progression of PDA disease --> medical management recommended   Carotid artery disease (HCC)    a. 60-79% LICA, 03/2012    Chronic bronchitis (HCC)    Chronic HFrEF (heart failure with reduced ejection fraction) (HCC)    Colon cancer (HCC) 1992   Esophageal stricture    ESRD on hemodialysis (HCC)    ESRD due to HTN, started dialysis 2011 and gets HD at [facility] with Dr Fausto Skillern on MWF schedule.  Access is LUA AVF as of Sept 2014.    GERD (gastroesophageal reflux disease)    High cholesterol 12/2011   History of  blood transfusion 07/2011; 12/2011; 01/2012 X 2; 04/2012   History of gout    History of lower GI bleeding    Hypertension    Iron deficiency anemia    Jugular vein occlusion, right (HCC)    Mitral regurgitation    a. Moderate by echo, 02/2012   Mitral valve disease    NSVT (nonsustained ventricular tachycardia) (HCC)    Ovarian cancer (HCC) 1992   PAF (paroxysmal atrial fibrillation) (HCC)    Pneumonia ~ 2009   PUD (peptic ulcer disease)    TIA (transient ischemic attack)    Tricuspid valve disease     Past Surgical History:  Procedure Laterality Date   A/V FISTULAGRAM Left 10/04/2022   Procedure: A/V Fistulagram;  Surgeon: Renford Dills, MD;  Location: [facility] INVASIVE CV LAB;  Service: Cardiovascular;  Laterality: Left;   A/V SHUNTOGRAM Left 03/19/2019   Procedure: A/V SHUNTOGRAM;  Surgeon: Renford Dills, MD;  Location: [facility] INVASIVE CV LAB;  Service: Cardiovascular;  Laterality: Left;   ABDOMINAL HYSTERECTOMY  1992   APPENDECTOMY  06/1990   AV FISTULA PLACEMENT  07/2009   left upper arm   AV FISTULA PLACEMENT Right 09/06/2016   Procedure: RIGHT FOREARM ARTERIOVENOUS (AV) GRAFT;  Surgeon: Sherren Kerns, MD;  [facility] OR;  Service: Vascular;  Laterality: Right;   AV FISTULA PLACEMENT N/A 02/24/2017   Procedure: INSERTION OF ARTERIOVENOUS (AV) GORE-TEX GRAFT ARM (BRACHIAL AXILLARY);  Surgeon: Renford Dills, MD;  Location: [facility] ORS;  Service: Vascular;  Laterality: N/A;   AVGG REMOVAL Right 09/06/2016   Procedure: REMOVAL OF Right Arm ARTERIOVENOUS GORETEX GRAFT and Vein Patch angioplasty of brachial artery;  Surgeon: Chuck Hint, MD;  Location: [facility] OR;  Service: Vascular;  Laterality: Right;   BIOPSY  09/26/2019   Procedure: BIOPSY;  Surgeon: Malissa Hippo, MD;  [facility];  Service: Endoscopy;;   BIOPSY  11/01/2022   Procedure: BIOPSY;  Surgeon: Dolores Frame, MD;  [facility];  Service: Gastroenterology;;   COLON  RESECTION  1992   COLON SURGERY     COLONOSCOPY N/A 03/09/2019   Procedure: COLONOSCOPY;  Surgeon: Malissa Hippo, MD;  [facility];  Service: Endoscopy;  Laterality: N/A;   COLONOSCOPY N/A 08/11/2022   Procedure: COLONOSCOPY;  Surgeon: Beverley Fiedler, MD;  Location: [facility] ENDOSCOPY;  Service: Gastroenterology;  Laterality: N/A;   COLONOSCOPY WITH PROPOFOL N/A  06/08/2021   Procedure: COLONOSCOPY WITH PROPOFOL;  Surgeon: Dolores Frame, MD;  [facility];  Service: Gastroenterology;  Laterality: N/A;  9:05 /Patient is on dialysis Mon Wed Fri   CORONARY ANGIOPLASTY WITH STENT PLACEMENT  12/15/11   "2"   CORONARY ANGIOPLASTY WITH STENT PLACEMENT  y/2013   "1; makes total of 3" (05/02/2012)   CORONARY ARTERY BYPASS GRAFT  06/13/2012   Procedure: CORONARY ARTERY BYPASS GRAFTING (CABG);  Surgeon: Delight Ovens, MD;  Location: [facility] OR;  Service: Open Heart Surgery;  Laterality: N/A;  cabg x four;  using left internal mammary artery, and left leg greater saphenous vein harvested endoscopically   CORONARY STENT INTERVENTION N/A 10/13/2016   Procedure: Coronary Stent Intervention;  Surgeon: Lennette Bihari, MD;  [facility] IN[facility];  Service: Cardiovascular;  Laterality: N/A;   DIALYSIS/PERMA CATHETER REMOVAL N/A 04/18/2017   Procedure: DIALYSIS/PERMA CATHETER REMOVAL;  Surgeon: Renford Dills, MD;  Location: [facility] INVASIVE CV LAB;  Service: Cardiovascular;  Laterality: N/A;   DILATION AND CURETTAGE OF UTERUS     ENTEROSCOPY N/A 06/08/2021   Procedure: PUSH ENTEROSCOPY;  Surgeon: Dolores Frame, MD;  [facility];  Service: Gastroenterology;  Laterality: N/A;   ENTEROSCOPY N/A 11/01/2022   Procedure: ENTEROSCOPY;  Surgeon: Dolores Frame, MD;  [facility];  Service: Gastroenterology;  Laterality: N/A;   ENTEROSCOPY N/A 12/01/2022   Procedure: ENTEROSCOPY;  Surgeon: Benancio Deeds, MD;  Location: [facility] ENDOSCOPY;  Service:  Gastroenterology;  Laterality: N/A;   ESOPHAGOGASTRODUODENOSCOPY  01/20/2012   Procedure: ESOPHAGOGASTRODUODENOSCOPY (EGD);  Surgeon: Meryl Dare, MD,FACG;  Location: [facility] ENDOSCOPY;  Service: Endoscopy;  Laterality: N/A;   ESOPHAGOGASTRODUODENOSCOPY N/A 03/26/2013   Procedure: ESOPHAGOGASTRODUODENOSCOPY (EGD);  Surgeon: Hilarie Fredrickson, MD;  Location: [facility] ENDOSCOPY;  Service: Endoscopy;  Laterality: N/A;   ESOPHAGOGASTRODUODENOSCOPY N/A 04/30/2015   Procedure: ESOPHAGOGASTRODUODENOSCOPY (EGD);  Surgeon: Malissa Hippo, MD;  [facility];  Service: Endoscopy;  Laterality: N/A;  1pm - moved to 10/20 @ 1:10   ESOPHAGOGASTRODUODENOSCOPY N/A 07/29/2016   Procedure: ESOPHAGOGASTRODUODENOSCOPY (EGD);  Surgeon: Ruffin Frederick, MD;  Location: [facility] ENDOSCOPY;  Service: Gastroenterology;  Laterality: N/A;  enteroscopy   ESOPHAGOGASTRODUODENOSCOPY N/A 09/26/2019   Procedure: ESOPHAGOGASTRODUODENOSCOPY (EGD);  Surgeon: Malissa Hippo, MD;  [facility];  Service: Endoscopy;  Laterality: N/A;  1250   ESOPHAGOGASTRODUODENOSCOPY N/A 08/11/2022   Procedure: ESOPHAGOGASTRODUODENOSCOPY (EGD);  Surgeon: Beverley Fiedler, MD;  Location: [facility] ENDOSCOPY;  Service: Gastroenterology;  Laterality: N/A;   ESOPHAGOGASTRODUODENOSCOPY (EGD) WITH PROPOFOL N/A 02/05/2021   Procedure: ESOPHAGOGASTRODUODENOSCOPY (EGD) WITH PROPOFOL;  Surgeon: Lanelle Bal, DO;  [facility];  Service: Endoscopy;  Laterality: N/A;   ESOPHAGOGASTRODUODENOSCOPY (EGD) WITH PROPOFOL N/A 08/27/2022   Procedure: ESOPHAGOGASTRODUODENOSCOPY (EGD) WITH PROPOFOL;  Surgeon: Lanelle Bal, DO;  [facility];  Service: Endoscopy;  Laterality: N/A;   GIVENS CAPSULE STUDY N/A 03/07/2019   Procedure: GIVENS CAPSULE STUDY;  Surgeon: Malissa Hippo, MD;  [facility];  Service: Endoscopy;  Laterality: N/A;  7:30   GIVENS CAPSULE STUDY N/A 04/22/2021   Procedure: GIVENS CAPSULE STUDY;  Surgeon: Malissa Hippo, MD;  [facility];  Service: Endoscopy;  Laterality: N/A;  7:30   GIVENS CAPSULE STUDY N/A 08/27/2022   Procedure: GIVENS CAPSULE STUDY;  Surgeon: Lanelle Bal, DO;  [facility];  Service: Endoscopy;  Laterality: N/A;   HOT HEMOSTASIS N/A 08/11/2022  Procedure: HOT HEMOSTASIS (ARGON PLASMA COAGULATION/BICAP);  Surgeon: Beverley Fiedler, MD;  Location: [facility] ENDOSCOPY;  Service: Gastroenterology;  Laterality: N/A;   HOT HEMOSTASIS N/A 12/01/2022   Procedure: HOT HEMOSTASIS (ARGON PLASMA COAGULATION/BICAP);  Surgeon: Benancio Deeds, MD;  Location: [facility] ENDOSCOPY;  Service: Gastroenterology;  Laterality: N/A;   INSERTION OF DIALYSIS CATHETER N/A 10/05/2020   Procedure: ABORTED TUNNELED DIALYSIS CATHETER PLACEMENT RIGHT INTERNAL JUGULAR VEIN ;  Surgeon: Lucretia Roers, MD;  Location: [facility];  Service: General;  Laterality: N/A;   INTRAOPERATIVE TRANSESOPHAGEAL ECHOCARDIOGRAM  06/13/2012   Procedure: INTRAOPERATIVE TRANSESOPHAGEAL ECHOCARDIOGRAM;  Surgeon: Delight Ovens, MD;  Location: [facility] OR;  Service: Open Heart Surgery;  Laterality: N/A;   IR DIALY SHUNT INTRO NEEDLE/INTRACATH INITIAL W/IMG LEFT Left 10/06/2020   IR FLUORO GUIDE CV LINE RIGHT  06/17/2020   IR FLUORO GUIDE CV LINE RIGHT  11/12/2022   IR GENERIC HISTORICAL  07/26/2016   IR FLUORO GUIDE CV LINE RIGHT 07/26/2016 Berdine Dance, MD [facility]   IR GENERIC HISTORICAL  07/26/2016   IR US GUIDE VASC ACCESS RIGHT 07/26/2016 Berdine Dance, MD [facility]   IR GENERIC HISTORICAL  08/02/2016   IR US GUIDE VASC ACCESS RIGHT 08/02/2016 Berdine Dance, MD [facility]   IR GENERIC HISTORICAL  08/02/2016   IR FLUORO GUIDE CV LINE RIGHT 08/02/2016 Berdine Dance, MD [facility]   IR RADIOLOGY PERIPHERAL GUIDED IV START  03/28/2017   IR REMOVAL TUN CV CATH W/O FL  08/11/2020   IR REMOVAL TUN CV CATH W/O FL  11/15/2022   IR THROMBECTOMY AV FISTULA W/THROMBOLYSIS INC/SHUNT/IMG LEFT Left 06/17/2020   IR US GUIDE VASC  ACCESS LEFT  06/17/2020   IR US GUIDE VASC ACCESS RIGHT  03/28/2017   IR US GUIDE VASC ACCESS RIGHT  06/17/2020   IR US GUIDE VASC ACCESS RIGHT  11/12/2022   LEFT HEART CATH AND CORONARY ANGIOGRAPHY N/A 09/20/2016   Procedure: Left Heart Cath and Coronary Angiography;  Surgeon: Lyn Records, MD;  Location: [facility] INVASIVE CV LAB;  Service: Cardiovascular;  Laterality: N/A;   LEFT HEART CATH AND CORS/GRAFTS ANGIOGRAPHY N/A 10/13/2016   Procedure: Left Heart Cath and Cors/Grafts Angiography;  Surgeon: Lennette Bihari, MD;  [facility]: [facility] INVAS[facility] CV LAB;  Service: Cardiovascular;  Laterality: N/A;   LEFT HEART CATH AND CORS/GRAFTS ANGIOGRAPHY N/A 07/13/2018   Procedure: LEFT HEART CATH AND CORS/GRAFTS ANGIOGRAPHY;  Surgeon: Swaziland, Peter M, MD;  Location: [facility] INVASIVE CV LAB;  Service: Cardiovascular;  Laterality: N/A;   LEFT HEART CATH AND CORS/GRAFTS ANGIOGRAPHY N/A 07/22/2021   Procedure: LEFT HEART CATH AND CORS/GRAFTS ANGIOGRAPHY;  Surgeon: Runell Gess, MD;  [facility] INVASIVE CV LAB;  Service: Cardiovascular;  Laterality: N/A;   LEFT HEART CATHETERIZATION WITH CORONARY ANGIOGRAM N/A 12/15/2011   Procedure: LEFT HEART CATHETERIZATION WITH CORONARY ANGIOGRAM;  Surgeon: Kathleene Hazel, MD;  Location: [facility] CATH LAB;  Service: Cardiovascular;  Laterality: N/A;   LEFT HEART CATHETERIZATION WITH CORONARY ANGIOGRAM N/A 01/10/2012   Procedure: LEFT HEART CATHETERIZATION WITH CORONARY ANGIOGRAM;  Surgeon: Peter M Swaziland, MD;  Location: [facility] CATH LAB;  Service: Cardiovascular;  Laterality: N/A;   LEFT HEART CATHETERIZATION WITH CORONARY ANGIOGRAM N/A 06/08/2012   Procedure: LEFT HEART CATHETERIZATION WITH CORONARY ANGIOGRAM;  Surgeon: Kathleene Hazel, MD;  Location: [facility] CATH LAB;  Service: Cardiovascular;  Laterality: N/A;   LEFT HEART CATHETERIZATION WITH CORONARY/GRAFT ANGIOGRAM N/A 12/10/2013   Procedure: LEFT HEART CATHETERIZATION WITH Isabel Caprice;  Surgeon: Corky Crafts,  MD;   Location: [facility] CATH LAB;  Service: Cardiovascular;  Laterality: N/A;   OVARY SURGERY     ovarian cancer   POLYPECTOMY  03/09/2019   Procedure: POLYPECTOMY;  Surgeon: Malissa Hippo, MD;  [facility];  Service: Endoscopy;;  cecal    POLYPECTOMY N/A 09/26/2019   Procedure: DUODENAL POLYPECTOMY;  Surgeon: Malissa Hippo, MD;  [facility];  Service: Endoscopy;  Laterality: N/A;   POLYPECTOMY  08/11/2022   Procedure: POLYPECTOMY;  Surgeon: Beverley Fiedler, MD;  Location: [facility] ENDOSCOPY;  Service: Gastroenterology;;   REVISION OF ARTERIOVENOUS GORETEX GRAFT N/A 02/24/2017   Procedure: REVISION OF ARTERIOVENOUS GORETEX GRAFT (RESECTION);  Surgeon: Renford Dills, MD;  Location: [facility] ORS;  Service: Vascular;  Laterality: N/A;   REVISON OF ARTERIOVENOUS FISTULA Left 06/19/2020   Procedure: REVISION OF LEFT UPPER ARM AV GRAFT WITH INTERPOSITION JUMP GRAFT USING GORE LIMB;  Surgeon: Cephus Shelling, MD;  Location: [facility] OR;  Service: Vascular;  Laterality: Left;   SHUNTOGRAM N/A 10/15/2013   Procedure: Fistulogram;  Surgeon: Nada Libman, MD;  Location: [facility] CATH LAB;  Service: Cardiovascular;  Laterality: N/A;   SUBMUCOSAL TATTOO INJECTION  11/01/2022   Procedure: SUBMUCOSAL TATTOO INJECTION;  Surgeon: Dolores Frame, MD;  [facility];  Service: Gastroenterology;;   THROMBECTOMY / ARTERIOVENOUS GRAFT REVISION  2011   left upper arm   TUBAL LIGATION  1980's   UPPER EXTREMITY ANGIOGRAPHY Bilateral 12/06/2016   Procedure: Upper Extremity Angiography;  Surgeon: Renford Dills, MD;  Location: [facility] INVASIVE CV LAB;  Service: Cardiovascular;  Laterality: Bilateral;   UPPER EXTREMITY INTERVENTION Left 06/06/2017   Procedure: UPPER EXTREMITY INTERVENTION;  Surgeon: Renford Dills, MD;  Location: [facility] INVASIVE CV LAB;  Service: Cardiovascular;  Laterality: Left;   UPPER EXTREMITY VENOGRAPHY Left 10/04/2022   Procedure: UPPER EXTREMITY VENOGRAPHY;  Surgeon:  Renford Dills, MD;  Location: [facility] INVASIVE CV LAB;  Service: Cardiovascular;  Laterality: Left;    Family History  Problem Relation Age of Onset   Heart disease Mother        Heart Disease before age 22   Hyperlipidemia Mother    Hypertension Mother    Diabetes Mother    Heart attack Mother    Heart disease Father        Heart Disease before age 62   Hyperlipidemia Father    Hypertension Father    Diabetes Father    Diabetes Sister    Hypertension Sister    Diabetes Brother    Hyperlipidemia Brother    Heart attack Brother    Hypertension Sister    Heart attack Brother    Colon cancer Child 96   Other Other        noncontributory for early CAD   Esophageal cancer Neg Hx    Liver disease Neg Hx    Kidney disease Neg Hx    Colon polyps Neg Hx     Allergies as of 12/06/2022 - Review Complete 12/01/2022  Allergen Reaction Noted   Amlodipine Swelling 01/19/2015   Aspirin Other (See Comments) 12/15/2011   Nitrofurantoin Hives 07/08/2009   Ranexa [ranolazine] Other (See Comments) 08/30/2020   Bactrim [sulfamethoxazole-trimethoprim] Rash 12/15/2011   Contrast media [iodinated contrast media] Itching 12/15/2011   Iron Itching and Other (See Comments) 05/02/2012   Gabapentin Other (See Comments) 02/08/2017   Iron sucrose Other (See Comments) 04/15/2021   Sucroferric oxyhydroxide Other (See Comments) 04/15/2021   Levaquin [levofloxacin in  d5w] Rash 10/04/2013   Pantoprazole Rash 03/21/2013   Plavix [clopidogrel bisulfate] Rash 01/05/2012   Protonix [pantoprazole sodium] Rash 03/21/2013   Venofer [ferric oxide] Itching and Other (See Comments) 05/02/2012    Social History   Socioeconomic History   Marital status: Married    Spouse name: Emmalia Hiler   Number of children: 1   Years of education: Not on file   Highest education level: GED or equivalent  Occupational History   Occupation: retired- Architectural technologist- sewed  Tobacco Use   Smoking status:  Never   Smokeless tobacco: Never  Vaping Use   Vaping Use: Never used  Substance and Sexual Activity   Alcohol use: No    Alcohol/week: 0.0 standard drinks of alcohol   Drug use: No   Sexual activity: Not Currently    Birth control/protection: Surgical  Other Topics Concern   Not on file  Social History Narrative   Lives in [place], [place] with husband.  Dialysis pt - mwf.   Social Determinants of Health   Financial Resource Strain: Low Risk  (12/14/2018)   Overall Financial Resource Strain (CARDIA)    Difficulty of Paying Living Expenses: Not very hard  Food Insecurity: No Food Insecurity (10/29/2022)   Hunger Vital Sign    Worried About Running Out of Food in the Last Year: Never true    Ran Out of Food in the Last Year: Never true  Transportation Needs: No Transportation Needs (10/29/2022)   PRAPARE - Administrator, Civil Service (Medical): No    Lack of Transportation (Non-Medical): No  Physical Activity: Unknown (12/14/2018)   Exercise Vital Sign    Days of Exercise per Week: Patient declined    Minutes of Exercise per Session: Patient declined  Stress: No Stress Concern Present (12/14/2018)   [employer] of Occupational Health - Occupational Stress Questionnaire    Feeling of Stress : Only a little  Social Connections: Unknown (12/14/2018)   Social Connection and Isolation Panel [NHANES]    Frequency of Communication with Friends and Family: Patient declined    Frequency of Social Gatherings with Friends and Family: Patient declined    Attends Religious Services: Patient declined    Database administrator or Organizations: Patient declined    Attends Banker Meetings: Patient declined    Marital Status: Patient declined     Review of Systems   Gen: Denies fever, chills, anorexia. Denies fatigue, weakness, weight loss.  CV: Denies chest pain, palpitations, syncope, peripheral edema, and claudication. Resp: Denies dyspnea at rest, cough, wheezing,  coughing up blood, and pleurisy. GI: See HPI Derm: Denies rash, itching, dry skin Psych: Denies depression, anxiety, memory loss, confusion. No homicidal or suicidal ideation.  Heme: Denies bruising, bleeding, and enlarged lymph nodes.   Physical Exam   There were no vitals taken for this visit.  General:   Alert and oriented. No distress noted. Pleasant and cooperative.  Head:  Normocephalic and atraumatic. Eyes:  Conjuctiva clear without scleral icterus. Mouth:  Oral mucosa pink and moist. Good dentition. No lesions. Lungs:  Clear to auscultation bilaterally. No wheezes, rales, or rhonchi. No distress.  Heart:  S1, S2 present without murmurs appreciated.  Abdomen:  +BS, soft, non-tender and non-distended. No rebound or guarding. No HSM or masses noted. Rectal: *** Msk:  Symmetrical without gross deformities. Normal posture. Extremities:  Without edema. Neurologic:  Alert and  oriented x4 Psych:  Alert and cooperative. Normal mood and affect.  Assessment  Shawna [name] is a 82 y.o. female with a history of *** presenting today with   GI AVMs, anemia:  Jejunal tubular adenoma:   PLAN   ***     Brooke Bonito, MSN, FNP-BC, AGACNP-BC [facility]

## 2022-12-04 NOTE — Progress Notes (Addendum)
Patient ID: Shawna [name], female   DOB: [birth year], 82 y.o.   MRN: [id number] [facility] Progress Note   Assessment/ Plan:   1.  Acute blood loss anemia secondary to rectal bleeding: With EGD 5/23 and APC of jejunal arteriovenous malformations/ angiodysplasia by Dr. Adela Lank.  She has not had any additional hematochezia or melena overnight and she has stable hemoglobin/hematocrit. 2.  End-stage renal disease: Usually on a Monday/Wednesday/Friday schedule and underwent hemodialysis uneventfully on Friday.  Possibly may be discharged home today given clinical stability and can resume outpatient hemodialysis. 3.  Hypokalemia: Likely secondary to limited intake and diarrhea prior to admission-improved with supplementation and 4K dialysate. 4.  Secondary hyperparathyroidism: On sevelamer for phosphorus binding with acceptable calcium and phosphorus level. 5.  Hypertension: Blood pressure elevated, resumed on oral antihypertensive therapy and monitor with ultrafiltration on hemodialysis. 6.  Atrial fibrillation: She is currently rate controlled and appears to be in sinus rhythm, not a candidate for anticoagulation due to recurrent GI bleed.  Subjective:   Denies any acute events overnight including diarrhea.  No additional hematochezia or melena.   Objective:   BP (!) 171/63 (BP Location: Right Arm)   Pulse 78   Temp 98.5 F (36.9 C) (Oral)   Resp 19   Wt 58.2 kg   SpO2 98%   BMI 23.47 kg/m   Physical Exam: Gen: Sitting up in recliner, awake/alert and watching television CVS: Pulse regular rhythm and normal rate, no murmur Resp:Clear to auscultation bilaterally, no rales/rhonchi. LIJ TDC with intact dressing WUJ:WJXB, flat, non-tender, bowel sounds normal Ext:No lower extremity edema. Thrombosed  LUA AVG  Labs: BMET Recent Labs  Lab 11/30/22 0130 12/01/22 0751 12/02/22 0800 12/03/22 0301  NA 135 136 132* 133*  K 3.4* 2.8* 3.1* 3.5  CL 101 98 95* 97*  CO2 14* 25  24 26   GLUCOSE 118* 78 87 79  BUN 59* 24* 41* 26*  CREATININE 7.84* 4.57* 6.83* 4.10*  CALCIUM 7.9* 7.7* 7.5* 8.1*  PHOS  --   --  5.3*  --    CBC Recent Labs  Lab 11/30/22 0130 12/01/22 0751 12/02/22 0800 12/03/22 0301  WBC 9.5 6.5 7.1 6.9  NEUTROABS 7.8*  --   --   --   HGB 7.1* 7.8* 7.4* 7.5*  HCT 23.0* 23.0* 22.3* 23.0*  MCV 116.2* 101.8* 106.7* 105.5*  PLT 261 198 187 202      Medications:     amiodarone  100 mg Oral Daily   atorvastatin  10 mg Oral QHS   carvedilol  3.125 mg Oral BID WC   Chlorhexidine Gluconate Cloth  6 each Topical Q0600   folic acid  1 mg Oral Daily   gabapentin  100 mg Oral QHS   heparin sodium (porcine)  3,800 Units Intracatheter Once   levETIRAcetam  500 mg Oral Daily   levothyroxine  75 mcg Oral QAC breakfast   pantoprazole (PROTONIX) IV  40 mg Intravenous Q12H   sevelamer carbonate  3,200 mg Oral TID WC   Zetta Bills, MD 12/04/2022, 9:40 AM

## 2022-12-04 NOTE — Progress Notes (Signed)
PROGRESS NOTE    Shawna [name]  ZOX:[id number] DOB: 1940-12-11 DOA: 11/30/2022 PCP: [facility]    Brief Narrative:   Shawna [name] is a 81 y.o. female with past medical history of end-stage renal disease on hemodialysis Monday, Wednesday, Friday, seizure disorder, paroxysmal atrial fibrillation not on anticoagulation secondary to history of GI bleed, hypertension, hyperlipidemia, CAD status post CABG and PCI, hypothyroidism, small bowel AVM who was recently admitted to the hospital for  recurrent GI bleeding with acute on chronic blood loss anemia, to the hospital with frequent loose bowel movements for last 3 days with some blood when wiping wiping.  In the ED, vitals were stable.  Hemoglobin was p7.1 from 9.2, 12 days back.  FOBT was positive.  1 unit of packed RBC was transfused.  BMP showed potassium of 3.4 with creatinine of 7.8.  [facility] was consulted and was admitted hospital for further evaluation and treatment.    During hospitalization, patient underwent EGD with fulguration of multiple AVMs in the stomach and duodenum.  Family has requested to skilled nursing facility placement at this time.  Physical therapy has been consulted.  12/03/2022: Patient seen alongside patient's sister-in-law and niece.  No further bleeding reported.  PT OT input is appreciated.  Transition of care team to assist with disposition.  12/04/2022: Patient seen.  No new complaints.  Blood pressure significantly elevated, with systolic blood pressure greater than 180s.  There seems to be foul smelling urine (Not certain if patient still makes urine).  Tmax of 100.5 F noted.  Will send urine for urinalysis and culture, blood for culture and chest x-ray.  Assessment/Plan Recurrent rectal bleeding/hx of small bowel AVM, history of peptic ulcer disease.   Remote history of colon cancer s/p resection in the 90s.  GI was consulted and underwent endoscopic evaluation with findings of several AVMs in the  stomach and jejunum, which were fulgurated.  AVMs likely the reason for the GI bleed.Marland Kitchen  Has been advanced and patient has remained stable.  Will monitor for further bleeding. C. difficile and GI pathogen panel negative. 12/03/2022: No further bleeding reported.  GI input is highly appreciated. 526/2024: Stable.  No further bleeding reported.   Acute blood loss anemia, history of anemia of chronic disease and ESRD Latest hemoglobin of 7.4 from 7.1.  FOBT positive.  Status post 1 unit of packed RBC.  Status post endoscopic evaluation with treatment of AVMs. 12/03/2022: No further bleeding reported.  H/H remained stable.  Hemoglobin today is 7.5 g/dL. 12/04/2022: H&H remained stable.  ESRD on HD MWF Nephrology on board for hemodialysis.  12/03/2022: Nephrology team is directing care.  Nephrology input is highly appreciated.  Hypokalemia/High anion gap metabolic acidosis Volume management/electrolyte management with dialysis.   12/03/2022: Potassium today is 3.5.  Continue to monitor.  Diarrhea.   GI pathogen panel was negative.  C. difficile PCR was negative.  Status post EGD  with AVMs which were ablated.  No further episodes.  Coronary artery disease status post CABG and PCI Continue to hold aspirin from home.   Hyperlipidemia On Lipitor.   Hypertension Antihypertensives on hold.   Seizure disorder Continue Keppra.   Pressure injury stage II rectum.  Present on admission.  Continue wound care. Pressure Injury 11/30/22 Rectum Stage 2 -  Partial thickness loss of dermis presenting as a shallow open injury with a red, pink wound bed without slough. open shallow ulcer with pink wound bed (Active)  11/30/22 0640  Location: Rectum  Location Orientation:   Staging: Stage 2 -  Partial thickness loss of dermis presenting as a shallow open injury with a red, pink wound bed without slough.  Wound Description (Comments): open shallow ulcer with pink wound bed  Present on Admission:       Deconditioning, debility. Patient has been states that she has been having weakness and fatigue.  Will receive PT OT evaluation.  Might need rehabilitation.   DVT prophylaxis: SCDs Start: 11/30/22 0324   Code Status:     Code Status: Full Code  Disposition: Uncertain, patient may need short-term rehab.  Status is: Inpatient  Remains inpatient appropriate because: Status post endoscopy with AVM, possible need for rehabilitation..   Family Communication:  Sister-in-law and niece.    Consultants:  GI  Procedures:  Upper GI endoscopy with ablation of gastric and proximal jejunal AVM on 12/01/2022  Antimicrobials:  None  Anti-infectives (From admission, onward)    None      Subjective: -Documented temperature of 100.5 F. -Significantly elevated blood pressure. -No diarrhea. -No shortness of breath or chest pain.    Objective: Vitals:   12/03/22 2037 12/04/22 0439 12/04/22 0842 12/04/22 1329  BP: (!) 149/53 (!) 154/62 (!) 171/63 (!) 186/69  Pulse: (!) 58 69 78 68  Resp: 17 18 19 19   Temp: 98.1 F (36.7 C) 98.5 F (36.9 C) 98.5 F (36.9 C) (!) 100.5 F (38.1 C)  TempSrc: Oral Oral Oral Oral  SpO2: 96% 99% 98% 100%  Weight: 58.2 kg       Intake/Output Summary (Last 24 hours) at 12/04/2022 1502 Last data filed at 12/04/2022 1459 Gross per 24 hour  Intake 840 ml  Output 0 ml  Net 840 ml    Filed Weights   12/02/22 0755 12/02/22 1150 12/03/22 2037  Weight: 56.9 kg 55.6 kg 58.2 kg    Physical Examination: Body mass index is 23.47 kg/m.   General: Not in any distress.  Awake and alert. HEENT: Patient is pale.  No jaundice. Neck: Supple. CVS: S1-S2. Lungs: Decreased air entry. Neuro: Awake and alert.  Moves all extremities. Extremities: Mild ankle edema bilaterally.    Data Reviewed:   CBC: Recent Labs  Lab 11/30/22 0130 12/01/22 0751 12/02/22 0800 12/03/22 0301  WBC 9.5 6.5 7.1 6.9  NEUTROABS 7.8*  --   --   --   HGB 7.1* 7.8* 7.4* 7.5*   HCT 23.0* 23.0* 22.3* 23.0*  MCV 116.2* 101.8* 106.7* 105.5*  PLT 261 198 187 202     Basic Metabolic Panel: Recent Labs  Lab 11/30/22 0130 12/01/22 0751 12/02/22 0800 12/03/22 0301  NA 135 136 132* 133*  K 3.4* 2.8* 3.1* 3.5  CL 101 98 95* 97*  CO2 14* 25 24 26   GLUCOSE 118* 78 87 79  BUN 59* 24* 41* 26*  CREATININE 7.84* 4.57* 6.83* 4.10*  CALCIUM 7.9* 7.7* 7.5* 8.1*  MG  --   --   --  1.4*  PHOS  --   --  5.3*  --      Liver Function Tests: Recent Labs  Lab 11/30/22 0130 12/02/22 0800  AST 6*  --   ALT 11  --   ALKPHOS 62  --   BILITOT 0.7  --   PROT 7.1  --   ALBUMIN 3.3* 2.3*      Radiology Studies: No results found.    LOS: 4 days   Time spent: 35 minutes.   Barnetta Chapel, MD [facility] Available  via Epic secure chat 7am-7pm After these hours, please refer to coverage provider listed on [email or web address] 12/04/2022, 3:02 PM

## 2022-12-04 NOTE — TOC Transition Note (Signed)
Transition of Care [facility]) - CM/SW Discharge Note   Patient Details  Name: Shawna [name] MRN: [id number] Date of Birth: July 21, 1940  [facility]) CM/SW Contact:  Gordy Clement, RN Phone Number: 12/04/2022, 12:48 PM   Clinical Narrative:  Patient plans on returning home with Home Health. Patient had been established with [facility] and will resume services  Order put in. Family to transport .  No additional TOC needs       Barriers to Discharge: Continued Medical Work up   Patient Goals and CMS Choice CMS Medicare.gov Compare Post Acute Care list provided to:: Patient Choice offered to / list presented to : Patient, Spouse  Discharge Placement                         Discharge Plan and Services Additional resources added to the After Visit Summary for                                       Social Determinants of Health (SDOH) Interventions SDOH Screenings   Food Insecurity: No Food Insecurity (10/29/2022)  Housing: Low Risk  (10/29/2022)  Transportation Needs: No Transportation Needs (10/29/2022)  Utilities: Not At Risk (10/29/2022)  Financial Resource Strain: Low Risk  (12/14/2018)  Physical Activity: Unknown (12/14/2018)  Social Connections: Unknown (12/14/2018)  Stress: No Stress Concern Present (12/14/2018)  Tobacco Use: Low Risk  (12/01/2022)     Readmission Risk Interventions    10/31/2022   12:45 PM 08/29/2022    4:47 PM 11/12/2021    8:56 AM  Readmission Risk Prevention Plan  Transportation Screening Complete Complete Complete  Medication Review Oceanographer) Complete Complete Complete  PCP or Specialist appointment within 3-5 days of discharge  Complete Complete  HRI or Home Care Consult Complete Complete Complete  SW Recovery Care/Counseling Consult Complete Complete Complete  Palliative Care Screening Not Applicable Not Applicable Not Applicable  Skilled Nursing Facility Not Applicable Complete Complete

## 2022-12-05 DIAGNOSIS — K5521 Angiodysplasia of colon with hemorrhage: Secondary | ICD-10-CM | POA: Diagnosis not present

## 2022-12-05 LAB — RENAL FUNCTION PANEL
Albumin: 2.3 g/dL — ABNORMAL LOW (ref 3.5–5.0)
Anion gap: 14 (ref 5–15)
BUN: 57 mg/dL — ABNORMAL HIGH (ref 8–23)
CO2: 22 mmol/L (ref 22–32)
Calcium: 8.3 mg/dL — ABNORMAL LOW (ref 8.9–10.3)
Chloride: 96 mmol/L — ABNORMAL LOW (ref 98–111)
Creatinine, Ser: 7.89 mg/dL — ABNORMAL HIGH (ref 0.44–1.00)
GFR, Estimated: 5 mL/min — ABNORMAL LOW (ref >60)
Glucose, Bld: 142 mg/dL — ABNORMAL HIGH (ref 70–99)
Phosphorus: 4.8 mg/dL — ABNORMAL HIGH (ref 2.5–4.6)
Potassium: 4.7 mmol/L (ref 3.5–5.1)
Sodium: 132 mmol/L — ABNORMAL LOW (ref 135–145)

## 2022-12-05 LAB — CBC
HCT: 22.9 % — ABNORMAL LOW (ref 36.0–46.0)
Hemoglobin: 7.3 g/dL — ABNORMAL LOW (ref 12.0–15.0)
MCH: 34.3 pg — ABNORMAL HIGH (ref 26.0–34.0)
MCHC: 31.9 g/dL (ref 30.0–36.0)
MCV: 107.5 fL — ABNORMAL HIGH (ref 80.0–100.0)
Platelets: 239 10*3/uL (ref 150–400)
RBC: 2.13 MIL/uL — ABNORMAL LOW (ref 3.87–5.11)
RDW: 22.3 % — ABNORMAL HIGH (ref 11.5–15.5)
WBC: 6.8 10*3/uL (ref 4.0–10.5)
nRBC: 0 % (ref 0.0–0.2)

## 2022-12-05 LAB — GLUCOSE, CAPILLARY: Glucose-Capillary: 134 mg/dL — ABNORMAL HIGH (ref 70–99)

## 2022-12-05 LAB — CULTURE, BLOOD (ROUTINE X 2): Culture: NO GROWTH

## 2022-12-05 LAB — MRSA NEXT GEN BY PCR, NASAL: MRSA by PCR Next Gen: NOT DETECTED

## 2022-12-05 MED ORDER — GUAIFENESIN ER 600 MG PO TB12
600.0000 mg | ORAL_TABLET | Freq: Two times a day (BID) | ORAL | Status: AC | PRN
  Administered 2022-12-05 – 2022-12-12 (×2): 600 mg via ORAL
  Filled 2022-12-05 (×2): qty 1

## 2022-12-05 NOTE — Progress Notes (Addendum)
Patient ID: FELINA GREYNOLDS, female   DOB: [birth year], 82 y.o.   MRN: [id number] [facility] Progress Note   Assessment/ Plan:   1.  Acute blood loss anemia secondary to rectal bleeding: Recurrent issue with EGD 5/23 and APC of jejunal arteriovenous malformations/ angiodysplasia by Dr. Adela Lank.  She has not had any additional hematochezia or melena overnight . Transfused 1 unit prbcs on 5/22.  Hemoglobin 7.3-- trending down. On high dose ESA at her outpatient clinic - got Mircera 200 on 5/20.  2.  End-stage renal disease: Usually on a Monday/Wednesday/Friday schedule. Continue on schedule. Dialysis today.  3.  Hypokalemia: Likely secondary to limited intake and diarrhea prior to admission-improved with supplementation and 4K dialysate. Resolved.  4.  Secondary hyperparathyroidism: On sevelamer for phosphorus binding with acceptable calcium and phosphorus level. 5.  Hypertension: Blood pressure improved this am.  Resumed on oral antihypertensive therapy and monitor with ultrafiltration on hemodialysis. 6.  Atrial fibrillation: She is currently rate controlled and appears to be in sinus rhythm, not a candidate for anticoagulation due to recurrent GI bleed. 7. Dispo: Pending   Subjective:   Seen in [facility]. Tolerating UF. Feels weak, tired today. Denies any further bleeding. Denies cp, dyspnea.    Objective:   BP (!) 131/58   Pulse 76   Temp 98.3 F (36.8 C)   Resp 18   Wt 59.1 kg   SpO2 97%   BMI 23.83 kg/m   Physical Exam: Gen: Sitting up in bed, on dialysis, nad  CVS: Pulse regular rhythm and normal rate, no murmur Resp:Clear to auscultation bilaterally, no rales/rhonchi. LIJ TDC with intact dressing WUJ:WJXB, flat, non-tender, bowel sounds normal Ext:No lower extremity edema. Thrombosed  LUA AVG  Labs: BMET Recent Labs  Lab 11/30/22 0130 12/01/22 0751 12/02/22 0800 12/03/22 0301 12/05/22 0859  NA 135 136 132* 133* 132*  K 3.4* 2.8* 3.1* 3.5 4.7  CL 101 98 95*  97* 96*  CO2 14* 25 24 26 22   GLUCOSE 118* 78 87 79 142*  BUN 59* 24* 41* 26* 57*  CREATININE 7.84* 4.57* 6.83* 4.10* 7.89*  CALCIUM 7.9* 7.7* 7.5* 8.1* 8.3*  PHOS  --   --  5.3*  --  4.8*    CBC Recent Labs  Lab 11/30/22 0130 12/01/22 0751 12/02/22 0800 12/03/22 0301 12/05/22 0900  WBC 9.5 6.5 7.1 6.9 6.8  NEUTROABS 7.8*  --   --   --   --   HGB 7.1* 7.8* 7.4* 7.5* 7.3*  HCT 23.0* 23.0* 22.3* 23.0* 22.9*  MCV 116.2* 101.8* 106.7* 105.5* 107.5*  PLT 261 198 187 202 239       Medications:     amiodarone  100 mg Oral Daily   atorvastatin  10 mg Oral QHS   carvedilol  6.25 mg Oral BID WC   Chlorhexidine Gluconate Cloth  6 each Topical Q0600   folic acid  1 mg Oral Daily   gabapentin  100 mg Oral QHS   heparin sodium (porcine)  3,800 Units Intracatheter Once   hydrALAZINE  50 mg Oral Q8H   levETIRAcetam  500 mg Oral Daily   levothyroxine  75 mcg Oral QAC breakfast   pantoprazole (PROTONIX) IV  40 mg Intravenous Q12H   sevelamer carbonate  3,200 mg Oral TID WC   Ogechi Susann Givens PA-C [facility] 12/05/2022,9:39 AM  Pt seen, examined and agree w A/P as above.  Vinson Moselle  MD 12/05/2022, 4:54 PM

## 2022-12-05 NOTE — Progress Notes (Signed)
PROGRESS NOTE    Shawna [name]  NWG:[id number] DOB: 1940/09/22 DOA: 11/30/2022 PCP: [facility]    Brief Narrative:  Shawna [name] is a 82 y.o. female with past medical history of end-stage renal disease on hemodialysis Monday, Wednesday, Friday, seizure disorder, paroxysmal atrial fibrillation not on anticoagulation secondary to history of GI bleed, hypertension, hyperlipidemia, CAD status post CABG and PCI, hypothyroidism, small bowel AVM who was recently admitted to the hospital for  recurrent GI bleeding with acute on chronic blood loss anemia, frequent loose bowel movements and some blood when wiping.  In the ED, vitals were stable.  Hemoglobin was p7.1 from 9.2, 12 days back.  FOBT was positive.  1 unit of packed RBC was transfused.  BMP showed potassium of 3.4 with creatinine of 7.8.  [facility] was consulted and was admitted hospital for further evaluation and treatment.    During hospitalization, patient underwent EGD with fulguration of multiple AVMs in the stomach and duodenum.  During the hospital stay, patient developed low-grade fever and significantly elevated blood pressure.  Blood pressure control has been optimized.  Cultures are pending.  Physical therapy and Occupational Therapy recommended home with PT OT.   12/03/2022: Patient seen alongside patient's sister-in-law and niece.  No further bleeding reported.  PT OT input is appreciated.  Transition of care team to assist with disposition.  12/04/2022: Patient seen.  No new complaints.  Blood pressure significantly elevated, with systolic blood pressure greater than 180s.   Tmax of 100.5 F noted.  Will send blood for culture.  Will also order chest x-ray.   12/05/2022: Seen on hemodialysis today.  Goal UF of 2.5 L.  Blood cultures are still pending.  Chest x-ray revealed persistent retrocardiac infiltrate.  No fever or chills.  No productive cough.  No fever documented overnight.  Patient was said to have had significant  abdominal pain last night.  Currently, patient denies abdominal pain.  No nausea or vomiting.  No diarrhea.  Blood pressure control has improved.  Follow final cultures.  Likely discharge in the next 1 to 2 days.  Assessment/Plan Recurrent rectal bleeding/hx of small bowel AVM, history of peptic ulcer disease.   Remote history of colon cancer s/p resection in the 90s.  GI was consulted and underwent endoscopic evaluation with findings of several AVMs in the stomach and jejunum, which were fulgurated.  AVMs likely the reason for the GI bleed.Marland Kitchen  Has been advanced and patient has remained stable.  Will monitor for further bleeding. C. difficile and GI pathogen panel negative. 12/03/2022: No further bleeding reported.  GI input is highly appreciated. 527/2024: Stable.  No further bleeding reported.   Acute blood loss anemia, history of anemia of chronic disease and ESRD Latest hemoglobin of 7.4 from 7.1.  FOBT positive.  Status post 1 unit of packed RBC.  Status post endoscopic evaluation with treatment of AVMs. 12/03/2022: No further bleeding reported.  H/H remained stable.  Hemoglobin today is 7.5 g/dL. 12/05/2022: H&H remained stable.  ESRD on HD MWF Nephrology on board for hemodialysis.  12/03/2022: Nephrology team is directing care.  Nephrology input is highly appreciated.  Hypokalemia/High anion gap metabolic acidosis Volume management/electrolyte management with dialysis.   12/05/2022: Potassium is 4.7 today.  Diarrhea.   GI pathogen panel was negative.  C. difficile PCR was negative.  Status post EGD  with AVMs which were ablated.  No further episodes.  Coronary artery disease status post CABG and PCI Continue to hold aspirin from  home.  Fever: -Negative blood cultures today. -Fever has resolved.  No further fever documented.  Abdominal pain: -Reported last night. -Chronic, patient denies abdominal pain.   Hyperlipidemia On Lipitor.   Hypertension Antihypertensives on hold.    Seizure disorder Continue Keppra.   Pressure injury stage II rectum.  Present on admission.  Continue wound care. Pressure Injury 11/30/22 Rectum Stage 2 -  Partial thickness loss of dermis presenting as a shallow open injury with a red, pink wound bed without slough. open shallow ulcer with pink wound bed (Active)  11/30/22 0640  Location: Rectum  Location Orientation:   Staging: Stage 2 -  Partial thickness loss of dermis presenting as a shallow open injury with a red, pink wound bed without slough.  Wound Description (Comments): open shallow ulcer with pink wound bed  Present on Admission:      Deconditioning, debility. Patient has been states that she has been having weakness and fatigue.  Will receive PT OT evaluation.  Might need rehabilitation.   DVT prophylaxis: SCDs Start: 11/30/22 0324   Code Status:     Code Status: Full Code  Disposition: Uncertain, patient may need short-term rehab.  Status is: Inpatient  Remains inpatient appropriate because: Status post endoscopy with AVM, possible need for rehabilitation..   Family Communication:  Sister-in-law and niece.    Consultants:  GI  Procedures:  Upper GI endoscopy with ablation of gastric and proximal jejunal AVM on 12/01/2022  Antimicrobials:  None  Anti-infectives (From admission, onward)    None      Subjective: -No fever. -Abdominal pain reported last night, but none resolved. -No fever or chills. -No nausea or vomiting. -No diarrhea.   -No shortness of breath or chest pain.   Objective: Vitals:   12/05/22 1230 12/05/22 1311 12/05/22 1530 12/05/22 1532  BP: (!) 160/57 (!) 153/57 (!) 153/56 (!) 140/42  Pulse: 71 72 71 70  Resp: 16 18 18 18   Temp: 97.7 F (36.5 C) 97.8 F (36.6 C) 97.8 F (36.6 C)   TempSrc:  Oral Oral   SpO2: 96% 100% 95% 95%  Weight:        Intake/Output Summary (Last 24 hours) at 12/05/2022 1645 Last data filed at 12/05/2022 1500 Gross per 24 hour  Intake 720 ml   Output 2000 ml  Net -1280 ml    Filed Weights   12/02/22 1150 12/03/22 2037 12/05/22 0835  Weight: 55.6 kg 58.2 kg 59.1 kg    Physical Examination: Body mass index is 23.83 kg/m.   General: Not in any distress.  Awake and alert. HEENT: Patient is pale.  No jaundice. Neck: Supple. CVS: S1-S2. Lungs: Decreased air entry. Neuro: Awake and alert.  Moves all extremities. Extremities: Mild ankle edema bilaterally.    Data Reviewed:   CBC: Recent Labs  Lab 11/30/22 0130 12/01/22 0751 12/02/22 0800 12/03/22 0301 12/05/22 0900  WBC 9.5 6.5 7.1 6.9 6.8  NEUTROABS 7.8*  --   --   --   --   HGB 7.1* 7.8* 7.4* 7.5* 7.3*  HCT 23.0* 23.0* 22.3* 23.0* 22.9*  MCV 116.2* 101.8* 106.7* 105.5* 107.5*  PLT 261 198 187 202 239     Basic Metabolic Panel: Recent Labs  Lab 11/30/22 0130 12/01/22 0751 12/02/22 0800 12/03/22 0301 12/05/22 0859  NA 135 136 132* 133* 132*  K 3.4* 2.8* 3.1* 3.5 4.7  CL 101 98 95* 97* 96*  CO2 14* 25 24 26 22   GLUCOSE 118* 78 87 79 142*  BUN 59* 24* 41* 26* 57*  CREATININE 7.84* 4.57* 6.83* 4.10* 7.89*  CALCIUM 7.9* 7.7* 7.5* 8.1* 8.3*  MG  --   --   --  1.4*  --   PHOS  --   --  5.3*  --  4.8*     Liver Function Tests: Recent Labs  Lab 11/30/22 0130 12/02/22 0800 12/05/22 0859  AST 6*  --   --   ALT 11  --   --   ALKPHOS 62  --   --   BILITOT 0.7  --   --   PROT 7.1  --   --   ALBUMIN 3.3* 2.3* 2.3*      Radiology Studies: DG CHEST PORT 1 VIEW  Result Date: 12/04/2022 CLINICAL DATA:  [id number] Fever [id number] EXAM: PORTABLE CHEST 1 VIEW COMPARISON:  CTA GI bleed 11/13/2022 FINDINGS: Left chest wall dialysis catheter with tip overlying the right atrium. The heart and mediastinal contours are unchanged. Surgical changes likely mediastinum. Coronary artery stent. Aortic calcification. Bibasilar atelectasis. Persistent retrocardiac airspace opacity. No pulmonary edema. Persistent trace right and trace to small left pleural effusions. No  pneumothorax. No acute osseous abnormality. Degenerative changes of the right shoulder. Intact sternotomy wires. IMPRESSION: 1. Persistent retrocardiac airspace opacity. 2. Persistent trace right and trace to small left pleural effusions. Electronically Signed   By: Tish Frederickson M.D.   On: 12/04/2022 22:07      LOS: 5 days   Time spent: 35 minutes.   Barnetta Chapel, MD [facility] Available via Epic secure chat 7am-7pm After these hours, please refer to coverage provider listed on [email or web address] 12/05/2022, 4:45 PM

## 2022-12-05 NOTE — Progress Notes (Signed)
   12/05/22 1230  Vitals  Temp 97.7 F (36.5 C)  Pulse Rate 71  Resp 16  BP (!) 160/57  SpO2 96 %  Post Treatment  Dialyzer Clearance Clear  Duration of HD Treatment -hour(s) 3.5 hour(s)  Hemodialysis Intake (mL) 200 mL  Liters Processed 84  Fluid Removed (mL) 2000 mL  Tolerated HD Treatment Yes   Received patient in bed to unit.  Alert and oriented.  Informed consent signed and in chart.   TX duration:3.5hrs  Patient tolerated well.  Transported back to the room  Alert, without acute distress.  Hand-off given to patient's nurse.   Access used: [facility] Access issues: none  Total UF removed: 1.8L Medication(s) given: none    [name]

## 2022-12-05 NOTE — Progress Notes (Signed)
PT Cancellation Note  Patient Details Name: Shawna [name] MRN: [id number] DOB: 07-03-1941   Cancelled Treatment:    Reason Eval/Treat Not Completed: Patient at procedure or test/unavailable. Pt currently off unit in HD. Will check back as schedule allows to continue with PT POC.    Marylynn Pearson 12/05/2022, 9:48 AM  Conni Slipper, PT, DPT [facility] Secure Chat Preferred Office: [phone]

## 2022-12-06 ENCOUNTER — Ambulatory Visit: Payer: Medicare HMO | Admitting: Gastroenterology

## 2022-12-06 ENCOUNTER — Other Ambulatory Visit (HOSPITAL_COMMUNITY): Payer: Self-pay

## 2022-12-06 ENCOUNTER — Ambulatory Visit: Payer: Medicare Other | Admitting: Gastroenterology

## 2022-12-06 DIAGNOSIS — I480 Paroxysmal atrial fibrillation: Secondary | ICD-10-CM | POA: Diagnosis not present

## 2022-12-06 DIAGNOSIS — L89300 Pressure ulcer of unspecified buttock, unstageable: Secondary | ICD-10-CM

## 2022-12-06 DIAGNOSIS — N186 End stage renal disease: Secondary | ICD-10-CM | POA: Diagnosis not present

## 2022-12-06 DIAGNOSIS — I10 Essential (primary) hypertension: Secondary | ICD-10-CM | POA: Diagnosis not present

## 2022-12-06 DIAGNOSIS — Z951 Presence of aortocoronary bypass graft: Secondary | ICD-10-CM

## 2022-12-06 DIAGNOSIS — K5521 Angiodysplasia of colon with hemorrhage: Secondary | ICD-10-CM | POA: Diagnosis not present

## 2022-12-06 DIAGNOSIS — E119 Type 2 diabetes mellitus without complications: Secondary | ICD-10-CM

## 2022-12-06 LAB — RENAL FUNCTION PANEL
Albumin: 2.2 g/dL — ABNORMAL LOW (ref 3.5–5.0)
Anion gap: 12 (ref 5–15)
BUN: 28 mg/dL — ABNORMAL HIGH (ref 8–23)
CO2: 24 mmol/L (ref 22–32)
Calcium: 8.1 mg/dL — ABNORMAL LOW (ref 8.9–10.3)
Chloride: 95 mmol/L — ABNORMAL LOW (ref 98–111)
Creatinine, Ser: 4.46 mg/dL — ABNORMAL HIGH (ref 0.44–1.00)
GFR, Estimated: 9 mL/min — ABNORMAL LOW (ref >60)
Glucose, Bld: 101 mg/dL — ABNORMAL HIGH (ref 70–99)
Phosphorus: 3.1 mg/dL (ref 2.5–4.6)
Potassium: 4.2 mmol/L (ref 3.5–5.1)
Sodium: 131 mmol/L — ABNORMAL LOW (ref 135–145)

## 2022-12-06 LAB — GLUCOSE, CAPILLARY
Glucose-Capillary: 88 mg/dL (ref 70–99)
Glucose-Capillary: 167 mg/dL — ABNORMAL HIGH (ref 70–99)

## 2022-12-06 LAB — CBC WITH DIFFERENTIAL/PLATELET
Abs Immature Granulocytes: 0.03 10*3/uL (ref 0.00–0.07)
Basophils Absolute: 0.1 10*3/uL (ref 0.0–0.1)
Basophils Relative: 1 %
Eosinophils Absolute: 0.2 10*3/uL (ref 0.0–0.5)
Eosinophils Relative: 3 %
HCT: 23.5 % — ABNORMAL LOW (ref 36.0–46.0)
Hemoglobin: 7.7 g/dL — ABNORMAL LOW (ref 12.0–15.0)
Immature Granulocytes: 0 %
Lymphocytes Relative: 9 %
Lymphs Abs: 0.6 10*3/uL — ABNORMAL LOW (ref 0.7–4.0)
MCH: 35.8 pg — ABNORMAL HIGH (ref 26.0–34.0)
MCHC: 32.8 g/dL (ref 30.0–36.0)
MCV: 109.3 fL — ABNORMAL HIGH (ref 80.0–100.0)
Monocytes Absolute: 0.8 10*3/uL (ref 0.1–1.0)
Monocytes Relative: 11 %
Neutro Abs: 5.1 10*3/uL (ref 1.7–7.7)
Neutrophils Relative %: 76 %
Platelets: 206 10*3/uL (ref 150–400)
RBC: 2.15 MIL/uL — ABNORMAL LOW (ref 3.87–5.11)
RDW: 22.5 % — ABNORMAL HIGH (ref 11.5–15.5)
WBC: 6.7 10*3/uL (ref 4.0–10.5)
nRBC: 0 % (ref 0.0–0.2)

## 2022-12-06 LAB — CULTURE, BLOOD (ROUTINE X 2)

## 2022-12-06 MED ORDER — CARVEDILOL 6.25 MG PO TABS
6.2500 mg | ORAL_TABLET | Freq: Two times a day (BID) | ORAL | 0 refills | Status: DC
  Filled 2022-12-06: qty 60, 30d supply, fill #0

## 2022-12-06 NOTE — Assessment & Plan Note (Signed)
Glucose remained stable during her hospitalization. She was placed on insulin sliding scale.

## 2022-12-06 NOTE — Assessment & Plan Note (Signed)
Continue blood pressure control with carvedilol and hydralazine.  Hold on Lisinopril for now.

## 2022-12-06 NOTE — Discharge Summary (Addendum)
Physician Discharge Summary   Patient: Shawna [name] MRN: [id number] DOB: March 27, 1941  Admit date:     11/30/2022  Discharge date: 12/06/22  Discharge Physician: Coralie Keens   PCP: [facility]   Recommendations at discharge:    Patient will continue antiacid therapy with proton pump inhibition.  Continue outpatient octreotide injections. Follow up Hgb and Hct as outpatient Continue blood pressure control with carvedilol and hydralazine, stopped lisinopril.  Follow up with primary care in 7 to 10 days. Follow up with GI as scheduled.   I spoke with patient's husband over the phone, we talked in detail about patient's condition, plan of care and prognosis and all questions were addressed. He would like to check if patient can go to SNF for short term.    Discharge Diagnoses: Active Problems:   AVM (arteriovenous malformation) of small bowel, acquired with hemorrhage   End-stage renal disease on hemodialysis (HCC)   Essential hypertension   PAF (paroxysmal atrial fibrillation) (HCC)   Hx of CABG   Diabetes mellitus type 2 in nonobese (HCC)   Pressure injury of skin   Hypothyroidism  Resolved Problems:   * No resolved hospital problems. *  Hospital Course: NAHDIA PREYER was admitted to the hospital with the working diagnosis of recurrent upper gastrointestinal bleeding.   82 y.o. female with past medical history of end-stage renal disease on hemodialysis Monday Wednesday Friday, seizure disorder, paroxysmal atrial fibrillation not on anticoagulation secondary to history of GI bleed, hypertension, hyperlipidemia, CAD status post CABG and PCI, hypothyroidism, small bowel AVM who was recently admitted for  recurrent GI bleeding with acute on chronic blood loss anemia, to the hospital with frequent loose bowel movements for last 3 days with some blood when wiping wiping. On her initial physical examination her blood pressure was 156/62, HR 62, RR 19 and 02  saturation 98%, lungs with no wheezing or rales, heart with S1 and S2 present irregularly irregular, abdomen with no distention or tenderness, no lower extremity edema.   Na 135, K 3,4 Cl 101, bicarbonate 14 glucose 118, bun 59 cr 7,84 AST 6 ALT 11  Wbc 1,98 hgb 7,1 plt 261  C diff antigen positive, C diff toxin negative, PCR negative.  GI panel infectious negative.   Chest radiograph with cardiomegaly, small bilateral pleural effusions, sternotomy wires in place, HD catheter left subclavian vein.   EKG 113 bpm, left axis deviation, left bundle branch block, prolonged qtc, atrial fibrillation rhythm with no significant ST segment or T wave changes.   Fecal occult test was positive.   1 unit of packed RBC was transfused.   GI consulted and patient underwent small bowel enteroscopy, finding numerous jejunal AVMs noted and treated with APC.  05/28 no further bleeding, plan to discharge home today, follow up as outpatient.   Assessment and Plan: AVM (arteriovenous malformation) of small bowel, acquired with hemorrhage Patient requires one unit PRBC transfusion with good toleration.  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 Six non bleeding angiodysplastic lesions in the proximal jejunum treated with argon plasma coagulation.   Plan to continue antiacid therapy and outpatient octreotide injections.  Follow up with Dr Levon Hedger as outpatient from GI.  Her discharge hgb is 7.7, follow up cell count as outpatient.    End-stage renal disease on hemodialysis [place] [facility]) Patient underwent renal replacement therapy during her hospitalization.  Her discharge BUN is 28 down from 57. She will continue renal replacement therapy with Monday, Wednesday and Friday schedule.  Essential hypertension Continue blood pressure control with carvedilol and hydralazine.  Hold on Lisinopril for now.   PAF (paroxysmal atrial fibrillation) (HCC) Continue rate control with carvedilol, no  anticoagulation or antiplatelet therapy due to recurrent GI bleeding.  Hx of CABG No chest pain.  Continue blood pressure control.   Diabetes mellitus type 2 in nonobese (HCC) Glucose remained stable during her hospitalization. She was placed on insulin sliding scale.   Pressure injury of skin Pressure Injury 11/30/22 Rectum Stage 2 - Partial thickness loss of dermis presenting as a shallow open injury with a red, pink wound bed without slough. open shallow ulcer with pink wound bed   Hypothyroidism Continue with levothyroxine.          Consultants: GI  Procedures performed: small bowel enteroscopy   Disposition: Home Diet recommendation:  Discharge Diet Orders (From admission, onward)     Start     Ordered   12/06/22 0000  Diet - low sodium heart healthy        12/06/22 1536           Cardiac and Carb modified diet DISCHARGE MEDICATION: Allergies as of 12/06/2022       Reactions   Amlodipine Swelling   Aspirin Other (See Comments)   High Doses Mess up her stomach; "makes my bowels have blood in them". Takes 81 mg EC Aspirin    Nitrofurantoin Hives   Ranexa [ranolazine] Other (See Comments)   Myoclonus-hospitalized    Bactrim [sulfamethoxazole-trimethoprim] Rash   Contrast Media [iodinated Contrast Media] Itching   Iron Itching, Other (See Comments)   "they gave me iron in dialysis; had to give me Benadryl cause I had to have the iron" (05/02/2012)   Gabapentin Other (See Comments)   Unknown reaction   Iron Sucrose Other (See Comments)   Unknown   Sucroferric Oxyhydroxide Other (See Comments)   Unknown   Levaquin [levofloxacin In D5w] Rash   Pantoprazole Rash   Plavix [clopidogrel Bisulfate] Rash   Protonix [pantoprazole Sodium] Rash   Venofer [ferric Oxide] Itching, Other (See Comments)   Patient reports using Benadryl prior to doses as [facility]        Medication List     STOP taking these medications    amoxicillin-clavulanate 500-125  MG tablet Commonly known as: AUGMENTIN   Aspirin 81 MG Caps   lisinopril 40 MG tablet Commonly known as: ZESTRIL       TAKE these medications    ALPRAZolam 0.5 MG tablet Commonly known as: XANAX Take 1 tablet (0.5 mg total) by mouth daily as needed for anxiety.   amiodarone 100 MG tablet Commonly known as: PACERONE Take 1 tablet (100 mg total) by mouth daily.   atorvastatin 10 MG tablet Commonly known as: LIPITOR Take 1 tablet by mouth at bedtime.   benzonatate 100 MG capsule Commonly known as: TESSALON Take 100 mg by mouth 3 (three) times daily as needed for cough.   carvedilol 6.25 MG tablet Commonly known as: COREG Take 1 tablet (6.25 mg total) by mouth 2 (two) times daily with a meal. What changed:  medication strength how much to take   cyclobenzaprine 5 MG tablet Commonly known as: FLEXERIL Take 5 mg by mouth 3 (three) times daily as needed.   folic acid 1 MG tablet Commonly known as: FOLVITE Take 1 mg by mouth daily.   gabapentin 100 MG capsule Commonly known as: NEURONTIN Take 100 mg by mouth every evening.   guaiFENesin-codeine 100-10 MG/5ML syrup  Take 5 mLs by mouth every 4 (four) hours as needed for cough.   hydrALAZINE 100 MG tablet Commonly known as: APRESOLINE Take 1 tablet (100 mg total) by mouth every 8 (eight) hours.   hydrOXYzine 25 MG tablet Commonly known as: ATARAX Take 25 mg by mouth every 6 (six) hours as needed for itching.   levETIRAcetam 500 MG tablet Commonly known as: KEPPRA Take 1 tablet (500 mg total) by mouth daily.   levothyroxine 75 MCG tablet Commonly known as: SYNTHROID Take 1 tablet (75 mcg total) by mouth daily before breakfast.   multivitamin Tabs tablet Take 1 tablet by mouth daily.   nitroGLYCERIN 0.4 MG SL tablet Commonly known as: NITROSTAT Place 0.4 mg under the tongue every 5 (five) minutes as needed for chest pain.   omeprazole 40 MG capsule Commonly known as: PRILOSEC Take 1 capsule (40 mg total)  by mouth daily.   ondansetron 4 MG disintegrating tablet Commonly known as: ZOFRAN-ODT Take 4 mg by mouth every 8 (eight) hours as needed for nausea or vomiting.   rosuvastatin 10 MG tablet Commonly known as: CRESTOR Take 1 tablet by mouth once daily   SandoSTATIN LAR [place] 20 MG injection Generic drug: octreotide Inject 20 mg into the muscle every 28 (twenty-eight) days.   sevelamer carbonate 800 MG tablet Commonly known as: RENVELA Take 3,200 mg by mouth in the morning and at bedtime.        Follow-up Information     [facility] Follow up.   Why: [facility] in [place] will be providing you with home health PT and OT and will contact yo within 48 hours of dc Contact information: [address] [place] [address] [phone]                Discharge Exam: Filed Weights   12/02/22 1150 12/03/22 2037 12/05/22 0835  Weight: 55.6 kg 58.2 kg 59.1 kg   BP (!) 122/47 (BP Location: Right Arm)   Pulse 63   Temp 99.5 F (37.5 C) (Oral)   Resp 16   Wt 59.1 kg   SpO2 100%   BMI 23.83 kg/m   Patient with no chest pain or dyspnea, no further melena or hematochezia.   Neurology awake and alert ENT with mild pallor Cardiovascular with S1 and S2 present, irregularly irregular with no gallops or rubs No JVD No lower extremity edema Respiratory with no rales or wheezing Abdomen with no distention   Condition at discharge: stable  The results of significant diagnostics from this hospitalization (including imaging, microbiology, ancillary and laboratory) are listed below for reference.   Imaging Studies: DG CHEST PORT 1 VIEW  Result Date: 12/04/2022 CLINICAL DATA:  [id number] Fever [id number] EXAM: PORTABLE CHEST 1 VIEW COMPARISON:  CTA GI bleed 11/13/2022 FINDINGS: Left chest wall dialysis catheter with tip overlying the right atrium. The heart and mediastinal contours are unchanged. Surgical changes likely mediastinum. Coronary artery stent. Aortic  calcification. Bibasilar atelectasis. Persistent retrocardiac airspace opacity. No pulmonary edema. Persistent trace right and trace to small left pleural effusions. No pneumothorax. No acute osseous abnormality. Degenerative changes of the right shoulder. Intact sternotomy wires. IMPRESSION: 1. Persistent retrocardiac airspace opacity. 2. Persistent trace right and trace to small left pleural effusions. Electronically Signed   By: Tish Frederickson M.D.   On: 12/04/2022 22:07   IR Removal Tun Cv Cath W/O FL  Result Date: 11/15/2022 INDICATION: IR placed non tunneled right common femoral central venous catheter due to poor venous access  11/12/2022. Request received to remove non tunneled right femoral catheter for discharge today. EXAM: REMOVAL NON TUNNELED RIGHT COMMON FEMORAL CENTRAL VENOUS CATHETER MEDICATIONS: None COMPLICATIONS: None immediate. PROCEDURE: Verbal consent was obtained from the patient following an explanation of the procedure, risks, benefits and alternatives to treatment. A time out was performed prior to the initiation of the procedure. Aseptic technique was utilized including mask, gloves and hand hygiene. Single suture was removed. The catheter was removed intact. Hemostasis was obtained with manual compression. A dressing was placed. The patient tolerated the procedure well without immediate post procedural complication. IMPRESSION: Successful removal non tunneled right common femoral central venous catheter. Read by: Alex Gardener, AGNP-BC Electronically Signed   By: Acquanetta Belling M.D.   On: 11/15/2022 14:03   DG Chest Port 1 View  Result Date: 11/15/2022 CLINICAL DATA:  Shortness of breath.  History of anemia. EXAM: PORTABLE CHEST 1 VIEW COMPARISON:  10/28/2022 FINDINGS: Status post median sternotomy and CABG procedure. Left sided dialysis catheter noted with tips in the right atrium. Stable mild cardiac enlargement. No pleural effusion, interstitial edema or airspace disease. No acute  bone abnormality noted. IMPRESSION: No acute cardiopulmonary abnormalities. Electronically Signed   By: Signa Kell M.D.   On: 11/15/2022 06:53   CT ANGIO GI BLEED  Result Date: 11/13/2022 CLINICAL DATA:  82 year old female with history of possible mesenteric ischemia. EXAM: CTA ABDOMEN AND PELVIS WITHOUT AND WITH CONTRAST TECHNIQUE: Multidetector CT imaging of the abdomen and pelvis was performed using the standard protocol during bolus administration of intravenous contrast. Multiplanar reconstructed images and MIPs were obtained and reviewed to evaluate the vascular anatomy. RADIATION DOSE REDUCTION: This exam was performed according to the departmental dose-optimization program which includes automated exposure control, adjustment of the mA and/or kV according to patient size and/or use of iterative reconstruction technique. CONTRAST:  80mL OMNIPAQUE IOHEXOL 350 MG/ML SOLN COMPARISON:  CT of the abdomen and pelvis 11/01/2012. FINDINGS: VASCULAR Aorta: Extensive aortic atherosclerosis. Normal caliber aorta without aneurysm, dissection, vasculitis or significant stenosis. Celiac: Atherosclerosis resulting in moderate stenosis at the ostium. Otherwise patent without evidence of aneurysm, dissection, vasculitis or other significant stenosis. SMA: Atherosclerosis resulting in moderate stenosis at the ostium. Otherwise patent without evidence of aneurysm, dissection, vasculitis or significant stenosis. Renals: Both renal arteries are patent but small caliber, with extensive atherosclerosis at the ostium of both vessels. IMA: Patent without evidence of aneurysm, dissection, vasculitis or significant stenosis. Inflow: Patent without evidence of aneurysm, dissection, vasculitis or significant stenosis. Proximal Outflow: Bilateral common femoral and visualized portions of the superficial and profunda femoral arteries are patent without evidence of aneurysm, dissection, vasculitis or significant stenosis. Veins: No  obvious venous abnormality within the limitations of this arterial phase study. Right femoral central venous catheter with tip extending into the intrahepatic portion of the inferior vena cava. Review of the MIP images confirms the above findings. NON-VASCULAR Lower chest: Small bilateral pleural effusions lying dependently with areas of passive subsegmental atelectasis in the lower lobes of the lungs bilaterally. Cardiomegaly. Atherosclerotic calcifications are noted in the descending thoracic aorta as well as the left anterior descending, left circumflex and right coronary arteries. Hepatobiliary: Mild periportal edema in the liver. No suspicious cystic or solid hepatic lesions. No intra or extrahepatic biliary ductal dilatation. Amorphous intermediate attenuation material lying dependently in the gallbladder, compatible with biliary sludge and/or noncalcified gallstones. Gallbladder is not distended. No pericholecystic fluid or surrounding inflammatory changes. Pancreas: No pancreatic mass. No pancreatic ductal dilatation. No pancreatic or peripancreatic  fluid collections or inflammatory changes. Spleen: Unremarkable. Adrenals/Urinary Tract: Severe atrophy of the kidneys bilaterally. Innumerable small subcentimeter low-attenuation lesions in both kidneys, too small to definitively characterize, but statistically likely to represent tiny cysts. In addition, in the upper pole of the right kidney (axial image 15 of series 12 and coronal image 88 of series 15) there is a partially exophytic partially calcified 2.5 x 1.7 x 2.1 cm lesion which demonstrates heterogeneous enhancement, concerning for possible neoplasm such as a renal cell carcinoma. No hydroureteronephrosis. Urinary bladder is nearly completely decompressed, but otherwise unremarkable in appearance. Bilateral adrenal glands are normal in appearance. Stomach/Bowel: The appearance of the stomach is unremarkable. There is no pathologic dilatation of small  bowel or colon. Suture lines are evident in the region of the rectosigmoid junction and the cecum, presumably from prior partial colectomies. No high attenuation is noted in the lumen of the colon to suggest active extravasation on today's examination. Numerous colonic diverticula are noted, without surrounding inflammatory changes to indicate an acute diverticulitis at this time. Lymphatic: No lymphadenopathy noted in the abdomen or pelvis. Reproductive: Status post hysterectomy. Ovaries are not confidently identified may be surgically absent or atrophic. Other: No significant volume of ascites.  No pneumoperitoneum. Musculoskeletal: There are no aggressive appearing lytic or blastic lesions noted in the visualized portions of the skeleton. IMPRESSION: VASCULAR 1. No acute findings. 2. Extensive aortic atherosclerosis and atherosclerosis in the visceral arteries with moderate stenosis at the ostium of the celiac axis and superior mesenteric arteries, and severe chronic stenosis of the renal arteries bilaterally. 3. Coronary artery disease. NON-VASCULAR 1. No evidence of intraluminal extravasation of contrast within the colon. 2. Extensive colonic diverticulosis, without evidence of acute diverticulitis at this time. 3. Partially calcified heterogeneously enhancing lesion extending off the medial aspect of the upper pole of the right kidney, concerning for potential renal cell carcinoma. Further evaluation with nonemergent outpatient abdominal MRI with and without IV gadolinium is recommended in the near future to provide definitive characterization. 4. Biliary sludge and/or noncalcified gallstones lying dependently in the gallbladder. No imaging findings to suggest an acute cholecystitis at this time. 5. Severe bilateral renal atrophy. 6. Small bilateral pleural effusions. 7. Cardiomegaly. 8. Additional incidental findings, as above. Electronically Signed   By: Trudie Reed M.D.   On: 11/13/2022 07:44   IR  Fluoro Guide CV Line Right  Result Date: 11/12/2022 INDICATION: Poor venous access. In need of durable intravenous access for contrast administration for impending CTA to evaluate for chronic mesenteric ischemia Patient with history of end-stage renal disease with multiple previous central venous accesses, currently receiving dialysis via left jugular approach dialysis catheter. Patient with history of contrast allergy, not presently premedicated. As such, the decision was made to proceed with placement of a non tunneled femoral approach central venous catheter given concern for difficulty/inability to place a right jugular approach central venous catheter given multiple previous hemodialysis catheters. EXAM: ULTRASOUND AND FLUOROSCOPIC GUIDED NON TUNNELED CENTRAL VENOUS CATHETER INSERTION MEDICATIONS: None. CONTRAST:  None FLUOROSCOPY TIME:  18 seconds (1 mGy) COMPLICATIONS: None immediate. TECHNIQUE: The procedure, risks, benefits, and alternatives were explained to the patient and informed written consent was obtained. A timeout was performed prior to the initiation of the procedure. The right groin was prepped with chlorhexidine in a sterile fashion, and a sterile drape was applied covering the operative field. Maximum barrier sterile technique with sterile gowns and gloves were used for the procedure. A timeout was performed prior to the initiation of  the procedure. Local anesthesia was provided with 1% lidocaine. Right femoral head was marked fluoroscopically. Under direct ultrasound guidance, the right common femoral vein vein was accessed with a micropuncture kit after the overlying soft tissues were anesthetized with 1% lidocaine. Real-time ultrasound guidance was utilized for vascular access including the acquisition of a permanent ultrasound image documenting patency of the accessed vessel. A guidewire was advanced to the level of the inferior caval-atrial junction for measurement purposes and the non  tunneled central venous catheter was cut to length. A peel-away sheath was placed and a 5 [place], dual lumen was inserted to level of the inferior caval-atrial junction. A post procedure spot fluoroscopic was obtained. The catheter easily aspirated and flushed and was secured in place with an interrupted suture. A dressing was applied. The patient tolerated the procedure well without immediate post procedural complication. FINDINGS: After catheter placement, the tip lies within the inferior cavoatrial junction. The catheter aspirates and flushes normally and is ready for immediate use. IMPRESSION: Successful ultrasound and fluoroscopic guided placement of a right common femoral vein approach, 5 [place], dual lumen non tunneled central venous catheter with tip at the inferior caval-atrial junction. The central venous catheter is ready for immediate use. Electronically Signed   By: Simonne Come M.D.   On: 11/12/2022 14:03   IR US Guide Vasc Access Right  Result Date: 11/12/2022 INDICATION: Poor venous access. In need of durable intravenous access for contrast administration for impending CTA to evaluate for chronic mesenteric ischemia Patient with history of end-stage renal disease with multiple previous central venous accesses, currently receiving dialysis via left jugular approach dialysis catheter. Patient with history of contrast allergy, not presently premedicated. As such, the decision was made to proceed with placement of a non tunneled femoral approach central venous catheter given concern for difficulty/inability to place a right jugular approach central venous catheter given multiple previous hemodialysis catheters. EXAM: ULTRASOUND AND FLUOROSCOPIC GUIDED NON TUNNELED CENTRAL VENOUS CATHETER INSERTION MEDICATIONS: None. CONTRAST:  None FLUOROSCOPY TIME:  18 seconds (1 mGy) COMPLICATIONS: None immediate. TECHNIQUE: The procedure, risks, benefits, and alternatives were explained to the patient and informed  written consent was obtained. A timeout was performed prior to the initiation of the procedure. The right groin was prepped with chlorhexidine in a sterile fashion, and a sterile drape was applied covering the operative field. Maximum barrier sterile technique with sterile gowns and gloves were used for the procedure. A timeout was performed prior to the initiation of the procedure. Local anesthesia was provided with 1% lidocaine. Right femoral head was marked fluoroscopically. Under direct ultrasound guidance, the right common femoral vein vein was accessed with a micropuncture kit after the overlying soft tissues were anesthetized with 1% lidocaine. Real-time ultrasound guidance was utilized for vascular access including the acquisition of a permanent ultrasound image documenting patency of the accessed vessel. A guidewire was advanced to the level of the inferior caval-atrial junction for measurement purposes and the non tunneled central venous catheter was cut to length. A peel-away sheath was placed and a 5 [place], dual lumen was inserted to level of the inferior caval-atrial junction. A post procedure spot fluoroscopic was obtained. The catheter easily aspirated and flushed and was secured in place with an interrupted suture. A dressing was applied. The patient tolerated the procedure well without immediate post procedural complication. FINDINGS: After catheter placement, the tip lies within the inferior cavoatrial junction. The catheter aspirates and flushes normally and is ready for immediate use. IMPRESSION: Successful ultrasound and  fluoroscopic guided placement of a right common femoral vein approach, 5 [place], dual lumen non tunneled central venous catheter with tip at the inferior caval-atrial junction. The central venous catheter is ready for immediate use. Electronically Signed   By: Simonne Come M.D.   On: 11/12/2022 14:03   [place] MESENTERIC ARTERIES  Result Date: 11/11/2022 CLINICAL DATA:  Abdominal  pain EXAM: [place] MESENTERIC ARTERIAL DOPPLER COMPARISON:  CT abdomen pelvis without contrast 11/02/2022 FINDINGS: Celiac axis: 141 cm/sec Unable to perform inspiration and expiration evaluation due to patient condition. Splenic artery: 175 cm/sec Hepatic artery: 45 cm/sec SMA: 315 cm/sec IMA: Not visualized Aortic size: 1.6 cm proximally, 1.2 cm in the mid segment and 1.1 cm distally IMPRESSION: Elevated peak systolic velocity in the superior mesenteric artery is suspicious for significant stenosis. Electronically Signed   By: Acquanetta Belling M.D.   On: 11/11/2022 10:25   DG Abd 1 View  Result Date: 11/07/2022 CLINICAL DATA:  Abdominal pain/distention EXAM: ABDOMEN - 1 VIEW COMPARISON:  Abdominal radiograph dated April 27th 2020 FINDINGS: Nonobstructive bowel-gas pattern. Surgical clips seen in the pelvis. No acute osseous abnormality. IMPRESSION: Nonobstructive bowel-gas pattern. Electronically Signed   By: Allegra Lai M.D.   On: 11/07/2022 17:57    Microbiology: Results for orders placed or performed during the hospital encounter of 11/30/22  C Difficile Quick Screen w PCR reflex     Status: Abnormal   Collection Time: 11/30/22  6:24 PM   Specimen: STOOL  Result Value Ref Range Status   C Diff antigen POSITIVE (A) NEGATIVE Final   C Diff toxin NEGATIVE NEGATIVE Final   C Diff interpretation Results are indeterminate. See PCR results.  Final    Comment: Performed at [facility], [address], [place], [place] [address]  Gastrointestinal Panel by PCR , Stool     Status: None   Collection Time: 11/30/22  6:24 PM   Specimen: STOOL  Result Value Ref Range Status   Campylobacter species NOT DETECTED NOT DETECTED Final   Plesimonas shigelloides NOT DETECTED NOT DETECTED Final   Salmonella species NOT DETECTED NOT DETECTED Final   Yersinia enterocolitica NOT DETECTED NOT DETECTED Final   Vibrio species NOT DETECTED NOT DETECTED Final   Vibrio cholerae NOT DETECTED NOT DETECTED Final    Enteroaggregative E coli (EAEC) NOT DETECTED NOT DETECTED Final   Enteropathogenic E coli (EPEC) NOT DETECTED NOT DETECTED Final   Enterotoxigenic E coli (ETEC) NOT DETECTED NOT DETECTED Final   Shiga like toxin producing E coli (STEC) NOT DETECTED NOT DETECTED Final   Shigella/Enteroinvasive E coli (EIEC) NOT DETECTED NOT DETECTED Final   Cryptosporidium NOT DETECTED NOT DETECTED Final   Cyclospora cayetanensis NOT DETECTED NOT DETECTED Final   Entamoeba histolytica NOT DETECTED NOT DETECTED Final   Giardia lamblia NOT DETECTED NOT DETECTED Final   Adenovirus F40/41 NOT DETECTED NOT DETECTED Final   Astrovirus NOT DETECTED NOT DETECTED Final   Norovirus GI/GII NOT DETECTED NOT DETECTED Final   Rotavirus A NOT DETECTED NOT DETECTED Final   Sapovirus (I, II, IV, and V) NOT DETECTED NOT DETECTED Final    Comment: Performed at [facility], [address] [place], [place] [address]  C. Diff by PCR, Reflexed     Status: None   Collection Time: 11/30/22  6:24 PM  Result Value Ref Range Status   Toxigenic C. Difficile by PCR NEGATIVE NEGATIVE Final    Comment: Patient is colonized with non toxigenic C. difficile. May not need  treatment unless significant symptoms are present. Performed at [facility], [address], [place], [place] [address]   Culture, blood (Routine X 2) w Reflex to ID Panel     Status: None (Preliminary result)   Collection Time: 12/04/22  3:38 PM   Specimen: BLOOD  Result Value Ref Range Status   Specimen Description BLOOD SITE NOT SPECIFIED  Final   Special Requests   Final    BOTTLES DRAWN AEROBIC ONLY Blood Culture results may not be optimal due to an inadequate volume of blood received in culture bottles   Culture   Final    NO GROWTH 2 DAYS Performed at [facility], [address] [place], [place] [address]    Report Status PENDING  Incomplete  Culture, blood (Routine X 2) w Reflex to ID Panel     Status: None (Preliminary result)    Collection Time: 12/04/22  8:13 PM   Specimen: BLOOD LEFT HAND  Result Value Ref Range Status   Specimen Description BLOOD LEFT HAND  Final   Special Requests   Final    BOTTLES DRAWN AEROBIC AND ANAEROBIC Blood Culture adequate volume   Culture   Final    NO GROWTH 2 DAYS Performed at [facility], [address], [place], [place] [address]    Report Status PENDING  Incomplete  MRSA Next Gen by PCR, Nasal     Status: None   Collection Time: 12/05/22  1:37 PM   Specimen: Nasal Mucosa; Nasal Swab  Result Value Ref Range Status   MRSA by PCR Next Gen NOT DETECTED NOT DETECTED Final    Comment: (NOTE) The GeneXpert MRSA Assay (FDA approved for NASAL specimens only), is one component of a comprehensive MRSA colonization surveillance program. It is not intended to diagnose MRSA infection nor to guide or monitor treatment for MRSA infections. Test performance is not FDA approved in patients less than 81 years old. Performed at [facility], [address] [place], [place] [address]    *Note: Due to a large number of results and/or encounters for the requested time period, some results have not been displayed. A complete set of results can be found in Results Review.    Labs: CBC: Recent Labs  Lab 11/30/22 0130 12/01/22 0751 12/02/22 0800 12/03/22 0301 12/05/22 0900 12/06/22 0235  WBC 9.5 6.5 7.1 6.9 6.8 6.7  NEUTROABS 7.8*  --   --   --   --  5.1  HGB 7.1* 7.8* 7.4* 7.5* 7.3* 7.7*  HCT 23.0* 23.0* 22.3* 23.0* 22.9* 23.5*  MCV 116.2* 101.8* 106.7* 105.5* 107.5* 109.3*  PLT 261 198 187 202 239 206   Basic Metabolic Panel: Recent Labs  Lab 12/01/22 0751 12/02/22 0800 12/03/22 0301 12/05/22 0859 12/06/22 0235  NA 136 132* 133* 132* 131*  K 2.8* 3.1* 3.5 4.7 4.2  CL 98 95* 97* 96* 95*  CO2 25 24 26 22 24   GLUCOSE 78 87 79 142* 101*  BUN 24* 41* 26* 57* 28*  CREATININE 4.57* 6.83* 4.10* 7.89* 4.46*  CALCIUM 7.7* 7.5* 8.1* 8.3* 8.1*  MG  --   --  1.4*  --    --   PHOS  --  5.3*  --  4.8* 3.1   Liver Function Tests: Recent Labs  Lab 11/30/22 0130 12/02/22 0800 12/05/22 0859 12/06/22 0235  AST 6*  --   --   --   ALT 11  --   --   --  ALKPHOS 62  --   --   --   BILITOT 0.7  --   --   --   PROT 7.1  --   --   --   ALBUMIN 3.3* 2.3* 2.3* 2.2*   CBG: Recent Labs  Lab 12/01/22 0852 12/01/22 0913 12/05/22 1528  GLUCAP 68* 79 134*    Discharge time spent: greater than 30 minutes.  Signed: Coralie Keens, MD [facility] 12/06/2022

## 2022-12-06 NOTE — Progress Notes (Signed)
[facility] Progress Note   Subjective:    Seen and examined patient at bedside. Patient finishing up working with PT. PT informed me she was c/o dizziness while walking today. BP re-checked and SBP was in the 150s. She denies SOB, CP, and N/V. Tolerated yesterday's HD with net UF 1.8L. I didn't see documented episodes of hypotension during HD yesterday. Next HD 5/29.  Objective Vitals:   12/05/22 2032 12/06/22 0523 12/06/22 0837 12/06/22 1029  BP: 120/61 (!) 129/53 (!) 143/70 (!) 158/72  Pulse: 71 71 73 82  Resp: 18 17 16    Temp: 98.1 F (36.7 C) 98.3 F (36.8 C) 98.2 F (36.8 C)   TempSrc: Oral Oral Oral   SpO2: 99% 98% 100% 100%  Weight:       Physical Exam General: Awake, alert, on RA, NAD Heart: S1 and S2; No murmurs, gallops, or rubs Lungs:Clear bilaterally; No wheezing, rales, and rhonchi Abdomen: Soft and non-tender Extremities: No LE edema Dialysis Access: LIJ TDC; L AVG (thrombosed)   Filed Weights   12/02/22 1150 12/03/22 2037 12/05/22 0835  Weight: 55.6 kg 58.2 kg 59.1 kg    Intake/Output Summary (Last 24 hours) at 12/06/2022 1130 Last data filed at 12/06/2022 0900 Gross per 24 hour  Intake 600 ml  Output 2000 ml  Net -1400 ml    Additional Objective Labs: Basic Metabolic Panel: Recent Labs  Lab 12/02/22 0800 12/03/22 0301 12/05/22 0859 12/06/22 0235  NA 132* 133* 132* 131*  K 3.1* 3.5 4.7 4.2  CL 95* 97* 96* 95*  CO2 24 26 22 24   GLUCOSE 87 79 142* 101*  BUN 41* 26* 57* 28*  CREATININE 6.83* 4.10* 7.89* 4.46*  CALCIUM 7.5* 8.1* 8.3* 8.1*  PHOS 5.3*  --  4.8* 3.1   Liver Function Tests: Recent Labs  Lab 11/30/22 0130 12/02/22 0800 12/05/22 0859 12/06/22 0235  AST 6*  --   --   --   ALT 11  --   --   --   ALKPHOS 62  --   --   --   BILITOT 0.7  --   --   --   PROT 7.1  --   --   --   ALBUMIN 3.3* 2.3* 2.3* 2.2*   No results for input(s): "LIPASE", "AMYLASE" in the last 168 hours. CBC: Recent Labs  Lab  11/30/22 0130 12/01/22 0751 12/02/22 0800 12/03/22 0301 12/05/22 0900 12/06/22 0235  WBC 9.5 6.5 7.1 6.9 6.8 6.7  NEUTROABS 7.8*  --   --   --   --  5.1  HGB 7.1* 7.8* 7.4* 7.5* 7.3* 7.7*  HCT 23.0* 23.0* 22.3* 23.0* 22.9* 23.5*  MCV 116.2* 101.8* 106.7* 105.5* 107.5* 109.3*  PLT 261 198 187 202 239 206   Blood Culture    Component Value Date/Time   SDES BLOOD LEFT HAND 12/04/2022 2013   SPECREQUEST  12/04/2022 2013    BOTTLES DRAWN AEROBIC AND ANAEROBIC Blood Culture adequate volume   CULT  12/04/2022 2013    NO GROWTH 2 DAYS Performed at [facility], [address], [place], [place] [address]    REPTSTATUS PENDING 12/04/2022 2013    Cardiac Enzymes: No results for input(s): "CKTOTAL", "CKMB", "CKMBINDEX", "TROPONINI" in the last 168 hours. CBG: Recent Labs  Lab 12/01/22 0852 12/01/22 0913 12/05/22 1528  GLUCAP 68* 79 134*   Iron Studies: No results for input(s): "IRON", "TIBC", "TRANSFERRIN", "FERRITIN" in the last 72 hours. Lab Results  Component Value  Date   INR 1.3 (H) 11/08/2021   INR 1.0 02/03/2021   INR 1.1 06/12/2020   Studies/Results: DG CHEST PORT 1 VIEW  Result Date: 12/04/2022 CLINICAL DATA:  [id number] Fever [id number] EXAM: PORTABLE CHEST 1 VIEW COMPARISON:  CTA GI bleed 11/13/2022 FINDINGS: Left chest wall dialysis catheter with tip overlying the right atrium. The heart and mediastinal contours are unchanged. Surgical changes likely mediastinum. Coronary artery stent. Aortic calcification. Bibasilar atelectasis. Persistent retrocardiac airspace opacity. No pulmonary edema. Persistent trace right and trace to small left pleural effusions. No pneumothorax. No acute osseous abnormality. Degenerative changes of the right shoulder. Intact sternotomy wires. IMPRESSION: 1. Persistent retrocardiac airspace opacity. 2. Persistent trace right and trace to small left pleural effusions. Electronically Signed   By: Tish Frederickson M.D.   On: 12/04/2022 22:07     Medications:   amiodarone  100 mg Oral Daily   atorvastatin  10 mg Oral QHS   carvedilol  6.25 mg Oral BID WC   Chlorhexidine Gluconate Cloth  6 each Topical Q0600   folic acid  1 mg Oral Daily   gabapentin  100 mg Oral QHS   heparin sodium (porcine)  3,800 Units Intracatheter Once   hydrALAZINE  50 mg Oral Q8H   levETIRAcetam  500 mg Oral Daily   levothyroxine  75 mcg Oral QAC breakfast   pantoprazole (PROTONIX) IV  40 mg Intravenous Q12H   sevelamer carbonate  3,200 mg Oral TID WC    Dialysis Orders: She receives her HD at [facility] M[facility]  Assessment/Plan: 1.  Acute blood loss anemia secondary to rectal bleeding: Recurrent issue with EGD 5/23 and APC of jejunal arteriovenous malformations/ angiodysplasia by Dr. Adela Lank.  She has not had any additional hematochezia or melena overnight . Transfused 1 unit prbcs on 5/22.  Hemoglobin now 7.7. On high dose ESA at her outpatient clinic - got Mircera 200 on 5/20.  2.  End-stage renal disease: Usually on a Monday/Wednesday/Friday schedule. Next HD 5/29. 3.  Hypokalemia: Likely secondary to limited intake and diarrhea prior to admission-improved with supplementation and 4K dialysate. Resolved. K+ now 4.2 4.  Secondary hyperparathyroidism: On sevelamer for phosphorus binding with acceptable calcium and phosphorus level. 5.  Hypertension: Blood pressure improved this am.  Resumed on oral antihypertensive therapy and monitor with ultrafiltration on hemodialysis. 6.  Atrial fibrillation: She is currently rate controlled and appears to be in sinus rhythm, not a candidate for anticoagulation due to recurrent GI bleed. 7. Dispo: Per Primary note from 5/27: may need rehabilitation  Salome Holmes, NP [facility] 12/06/2022,11:30 AM  LOS: 6 days

## 2022-12-06 NOTE — TOC Progression Note (Signed)
Transition of Care [facility]) - Progression Note    Patient Details  Name: CAROLE KRILL MRN: [id number] Date of Birth: [birth year]  [facility]) CM/SW Contact  Tom-Johnson, Hershal Coria, RN Phone Number: 12/06/2022, 4:04 PM  Clinical Narrative:     Patient's husband requesting patient go to SNF for short term. LCSW notified. TOC will continue to follow.         Barriers to Discharge: Continued Medical Work up  Expected Discharge Plan and Services       Living arrangements for the past 2 months: Single Family Home Expected Discharge Date: 12/06/22                                     Social Determinants of Health (SDOH) Interventions SDOH Screenings   Food Insecurity: No Food Insecurity (10/29/2022)  Housing: Low Risk  (10/29/2022)  Transportation Needs: No Transportation Needs (10/29/2022)  Utilities: Not At Risk (10/29/2022)  Financial Resource Strain: Low Risk  (12/14/2018)  Physical Activity: Unknown (12/14/2018)  Social Connections: Unknown (12/14/2018)  Stress: No Stress Concern Present (12/14/2018)  Tobacco Use: Low Risk  (12/04/2022)    Readmission Risk Interventions    10/31/2022   12:45 PM 08/29/2022    4:47 PM 11/12/2021    8:56 AM  Readmission Risk Prevention Plan  Transportation Screening Complete Complete Complete  Medication Review Oceanographer) Complete Complete Complete  PCP or Specialist appointment within 3-5 days of discharge  Complete Complete  HRI or Home Care Consult Complete Complete Complete  SW Recovery Care/Counseling Consult Complete Complete Complete  Palliative Care Screening Not Applicable Not Applicable Not Applicable  Skilled Nursing Facility Not Applicable Complete Complete

## 2022-12-06 NOTE — Assessment & Plan Note (Signed)
No chest pain.  Continue blood pressure control.

## 2022-12-06 NOTE — Progress Notes (Signed)
Occupational Therapy Treatment Patient Details Name: Shawna [name] MRN: [id number] DOB: Nov 24, 1940 Today's Date: 12/06/2022   History of present illness 82 yo female admitted due to rectal bleed and x4 weeks of diarrhea.  PMH ESRD on HD MWF, seizure disorder, afib, HTN, HLD, CAD s/p CABG and PCI, hypothyroidism, , Small bowel AVMS, GIB, Hx of colon CA, TIA, PUD, Gout.   OT comments  Pt sitting in recliner finishing lunch upon OT arrival. Pt agreeable to participation in skilled OT session. OT instructed pt in techniques for increased safety and independence with ADLs, functional transfers, and functional mobility. OT also educated pt in energy conservation strategies (including taking rest breaks, sitting during tasks, and planning out the day to allow for rest) with pt verbalizing understanding. Pt will benefit from reinforcement of education. Pt participated well and demonstrated ability to complete grooming tasks in standing with Min guard assist, LB clothing management/hygiene in simulated toileting task with Min assist, functional transfers with Min guard to Min assist, and functional mobility with a RW (2 wheels) with Min guard assist and cues for safety. Pt demonstrated improved standing balance this session and is making progress toward OT goals. Pt will benefit from continued acute skilled OT services. Discharge plan remains appropriate.    Recommendations for follow up therapy are one component of a multi-disciplinary discharge planning process, led by the attending physician.  Recommendations may be updated based on patient status, additional functional criteria and insurance authorization.    Assistance Recommended at Discharge Intermittent Supervision/Assistance  Patient can return home with the following  A little help with walking and/or transfers;Assistance with cooking/housework;Direct supervision/assist for financial management;Direct supervision/assist for medications  management;Assist for transportation;Help with stairs or ramp for entrance;A little help with bathing/dressing/bathroom   Equipment Recommendations  None recommended by OT    Recommendations for Other Services      Precautions / Restrictions Precautions Precautions: Fall Precaution Comments: R femoral catheter 5/4 Restrictions Weight Bearing Restrictions: No       Mobility Bed Mobility               General bed mobility comments: Pt sitting up in recliner upon OT arrival.    Transfers Overall transfer level: Needs assistance Equipment used: Rolling walker (2 wheels) Transfers: Sit to/from Stand, Bed to chair/wheelchair/BSC Sit to Stand: Min guard, Min assist (Min assist to power up with cues for hand placement)     Step pivot transfers: Min guard (with cues for safety)           Balance Overall balance assessment: Needs assistance Sitting-balance support: No upper extremity supported, Feet supported Sitting balance-Leahy Scale: Fair     Standing balance support: Single extremity supported, Bilateral upper extremity supported, During functional activity, Reliant on assistive device for balance Standing balance-Leahy Scale: Poor                             ADL either performed or assessed with clinical judgement   ADL Overall ADL's : Needs assistance/impaired Eating/Feeding: Independent;Sitting   Grooming: Min guard;Wash/dry hands;Wash/dry face;Oral care;Standing Grooming Details (indicate cue type and reason): Pt requiring unilateral UE support to maintain balance in standing at sink during grooming tasks.                 Toilet Transfer: Min guard;Minimal assistance;BSC/3in1;Rolling walker (2 wheels) (Min assist to power up with cues for hand placement)   Toileting- Clothing Manipulation and Hygiene: Minimal assistance;Sit to/from  stand Toileting - Clothing Manipulation Details (indicate cue type and reason): Simulated task      Functional mobility during ADLs: Min guard;Rolling walker (2 wheels)      Extremity/Trunk Assessment Upper Extremity Assessment Upper Extremity Assessment: Generalized weakness   Lower Extremity Assessment Lower Extremity Assessment: Defer to PT evaluation        Vision       Perception     Praxis      Cognition Arousal/Alertness: Awake/alert Behavior During Therapy: WFL for tasks assessed/performed Overall Cognitive Status: History of cognitive impairments - at baseline                                          Exercises      Shoulder Instructions       General Comments VSS on RA.    Pertinent Vitals/ Pain       Pain Assessment Pain Assessment: No/denies pain  Home Living                                          Prior Functioning/Environment              Frequency  Min 2X/week        Progress Toward Goals  OT Goals(current goals can now be found in the care plan section)  Progress towards OT goals: Progressing toward goals  Acute Rehab OT Goals Patient Stated Goal: to return home with husband  Plan Discharge plan remains appropriate    Co-evaluation                 AM-PAC OT "6 Clicks" Daily Activity     Outcome Measure   Help from another person eating meals?: None Help from another person taking care of personal grooming?: A Little Help from another person toileting, which includes using toliet, bedpan, or urinal?: A Little Help from another person bathing (including washing, rinsing, drying)?: A Little Help from another person to put on and taking off regular upper body clothing?: A Little Help from another person to put on and taking off regular lower body clothing?: A Little 6 Click Score: 19    End of Session Equipment Utilized During Treatment: Gait belt;Rolling walker (2 wheels)  OT Visit Diagnosis: Unsteadiness on feet (R26.81);Other abnormalities of gait and mobility (R26.89);Muscle  weakness (generalized) (M62.81);Pain   Activity Tolerance Patient tolerated treatment well   Patient Left in chair;with call bell/phone within reach;Other (comment) (with MD present)   Nurse Communication Mobility status        Time: 1340-1420 OT Time Calculation (min): 40 min  Charges: OT General Charges $OT Visit: 1 Visit OT Treatments $Self Care/Home Management : 38-52 mins  Andilyn Bettcher "Orson Eva., OTR/L, MA Acute Rehab [phone]   Lendon Colonel 12/06/2022, 4:20 PM

## 2022-12-06 NOTE — Progress Notes (Signed)
Initial Nutrition Assessment  DOCUMENTATION CODES:   Not applicable  INTERVENTION:  - Continue Heart Healthy diet.   NUTRITION DIAGNOSIS:   Increased nutrient needs related to wound healing as evidenced by estimated needs.  GOAL:   Patient will meet greater than or equal to 90% of their needs  MONITOR:   PO intake  REASON FOR ASSESSMENT:   Other (Comment) (Pressure Injury)    ASSESSMENT:   82 y.o. female admits related to frequent loose stools. PMH includes: acute on chronic respiratory failure, CAD, HFrEF, esophageal stricture, ESRD on HD, GERD. Pt is currently receiving medical management related to recurrent rectal bleeding.  Meds reviewed: lipitor, folic acid, renvela. Labs reviewed:  Na low, BUN/Creatinine elevated.   Pt was being seen by another provider at time of assessment. The pt is pending discharge potentially for today. Per record, pt ate 75% of her breakfast this am. Per record, pt has eaten 50-100% of her meals over the past 7 days. Pt with increased nutrient needs related to wound healing. Pt likely meeting her needs at this time. Since pt is planning to discharge today, RD will hold off on adding supplements for wound healing.   NUTRITION - FOCUSED PHYSICAL EXAM:  Attempt at follow up.   Diet Order:   Diet Order             Diet - low sodium heart healthy           Diet Heart Room service appropriate? Yes; Fluid consistency: Thin  Diet effective now                   EDUCATION NEEDS:   Not appropriate for education at this time  Skin:  Skin Assessment: Skin Integrity Issues: Skin Integrity Issues:: Stage II Stage II: sacrum  Last BM:  5/27  Height:   Ht Readings from Last 1 Encounters:  11/18/22 5\' 2"  (1.575 m)    Weight:   Wt Readings from Last 1 Encounters:  12/05/22 59.1 kg    Ideal Body Weight:     BMI:  Body mass index is 23.83 kg/m.  Estimated Nutritional Needs:   Kcal:  4332-9518 kcals  Protein:  90-105  gm  Fluid:  >/= 1.7 L  Bethann Humble, RD, LDN, CNSC.

## 2022-12-06 NOTE — Progress Notes (Signed)
Spoke to Joy at [employer] to provide update. Pt's husband requesting snf at d/c. Will assist as needed.   Olivia Canter Renal Navigator [phone]

## 2022-12-06 NOTE — Assessment & Plan Note (Signed)
Patient requires one unit PRBC transfusion with good toleration.  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 Six non bleeding angiodysplastic lesions in the proximal jejunum treated with argon plasma coagulation.   Plan to continue antiacid therapy and outpatient octreotide injections.  Follow up with Dr Levon Hedger as outpatient from GI.  Her discharge hgb is 7.7, follow up cell count as outpatient.

## 2022-12-06 NOTE — Progress Notes (Signed)
Physical Therapy Treatment Patient Details Name: Shawna [name] MRN: [id number] DOB: [birth year] Today's Date: 12/06/2022   History of Present Illness 82 yo female admitted due to rectal bleed and x4 weeks of diarrhea.  PMH ESRD on HD MWF, seizure disorder, afib, HTN, HLD, CAD s/p CABG and PCI, hypothyroidism, , Small bowel AVMS, GIB, Hx of colon CA, TIA, PUD, Gout.    PT Comments    Pt progressing towards physical therapy goals. Was able to ambulate in the hall with up to min guard assist and RW for support. Pt reports feeling lightheaded and dizzy during OOB mobility. VSS at end of gait training: 158/72, 82 bpm, SpO2 100% on RA. Will continue to follow and progress as able per POC.    Recommendations for follow up therapy are one component of a multi-disciplinary discharge planning process, led by the attending physician.  Recommendations may be updated based on patient status, additional functional criteria and insurance authorization.  Follow Up Recommendations       Assistance Recommended at Discharge Frequent or constant Supervision/Assistance  Patient can return home with the following A little help with walking and/or transfers;A little help with bathing/dressing/bathroom;Assistance with cooking/housework;Help with stairs or ramp for entrance;Assist for transportation;Direct supervision/assist for medications management   Equipment Recommendations  None recommended by PT    Recommendations for Other Services       Precautions / Restrictions Precautions Precautions: Fall Precaution Comments: R femoral catheter 5/4 Restrictions Weight Bearing Restrictions: No     Mobility  Bed Mobility Overal bed mobility: Modified Independent Bed Mobility: Supine to Sit     Supine to sit: Supervision Sit to supine: Min guard   General bed mobility comments: Supervision for safety, cue for EOB feet flat on floor    Transfers Overall transfer level: Needs assistance Equipment used:  Rolling walker (2 wheels) Transfers: Sit to/from Stand, Bed to chair/wheelchair/BSC Sit to Stand: Min guard   Step pivot transfers: Min assist       General transfer comment: Cue to push from bed with upper extremities to RW    Ambulation/Gait Ambulation/Gait assistance: Min guard Gait Distance (Feet): 100 Feet (x2 with 2 standing rest breaks) Assistive device: Rolling walker (2 wheels) Gait Pattern/deviations: Step-through pattern, Decreased stride length, Trunk flexed, Drifts right/left Gait velocity: decreased Gait velocity interpretation: 1.31 - 2.62 ft/sec, indicative of limited community ambulator   General Gait Details: Cue for proximity to RW, turning and upright stance   Stairs             Wheelchair Mobility    Modified Rankin (Stroke Patients Only)       Balance Overall balance assessment: Needs assistance Sitting-balance support: Feet supported, Bilateral upper extremity supported Sitting balance-Leahy Scale: Fair     Standing balance support: Bilateral upper extremity supported, During functional activity, Reliant on assistive device for balance Standing balance-Leahy Scale: Poor                              Cognition Arousal/Alertness: Awake/alert Behavior During Therapy: WFL for tasks assessed/performed Overall Cognitive Status: History of cognitive impairments - at baseline                                          Exercises      General Comments        Pertinent Vitals/Pain  Pain Assessment Pain Assessment: No/denies pain    Home Living                          Prior Function            PT Goals (current goals can now be found in the care plan section) Acute Rehab PT Goals Patient Stated Goal: get better and back to family PT Goal Formulation: With patient Time For Goal Achievement: 12/16/22 Potential to Achieve Goals: Good Progress towards PT goals: Progressing toward goals     Frequency    Min 3X/week      PT Plan Current plan remains appropriate    Co-evaluation PT/OT/SLP Co-Evaluation/Treatment: Yes Reason for Co-Treatment: Complexity of the patient's impairments (multi-system involvement) (no tech, 16 day hospitalization)          AM-PAC PT "6 Clicks" Mobility   Outcome Measure  Help needed turning from your back to your side while in a flat bed without using bedrails?: A Little Help needed moving from lying on your back to sitting on the side of a flat bed without using bedrails?: A Little Help needed moving to and from a bed to a chair (including a wheelchair)?: A Little Help needed standing up from a chair using your arms (e.g., wheelchair or bedside chair)?: A Little Help needed to walk in hospital room?: A Little Help needed climbing 3-5 steps with a railing? : A Lot 6 Click Score: 17    End of Session Equipment Utilized During Treatment: Gait belt Activity Tolerance: Patient tolerated treatment well Patient left: in chair;with call bell/phone within reach;with chair alarm set Nurse Communication: Mobility status PT Visit Diagnosis: Other abnormalities of gait and mobility (R26.89);Muscle weakness (generalized) (M62.81);Difficulty in walking, not elsewhere classified (R26.2)     Time: 1010-1036 PT Time Calculation (min) (ACUTE ONLY): 26 min  Charges:  $Gait Training: 23-37 mins                     Shawna [name], PT, DPT Acute Rehabilitation Services Secure Chat Preferred Office: [phone]    Shawna [name] 12/06/2022, 11:40 AM

## 2022-12-06 NOTE — Assessment & Plan Note (Signed)
Pressure Injury 11/30/22 Rectum Stage 2 - Partial thickness loss of dermis presenting as a shallow open injury with a red, pink wound bed without slough. open shallow ulcer with pink wound bed

## 2022-12-06 NOTE — Assessment & Plan Note (Signed)
Continue with levothyroxine  

## 2022-12-06 NOTE — Assessment & Plan Note (Signed)
Continue rate control with carvedilol, no anticoagulation or antiplatelet therapy due to recurrent GI bleeding.

## 2022-12-06 NOTE — Assessment & Plan Note (Signed)
Patient underwent renal replacement therapy during her hospitalization.  Her discharge BUN is 28 down from 57. She will continue renal replacement therapy with Monday, Wednesday and Friday schedule.

## 2022-12-07 DIAGNOSIS — R197 Diarrhea, unspecified: Secondary | ICD-10-CM | POA: Diagnosis not present

## 2022-12-07 DIAGNOSIS — D649 Anemia, unspecified: Secondary | ICD-10-CM | POA: Diagnosis not present

## 2022-12-07 LAB — CULTURE, BLOOD (ROUTINE X 2)

## 2022-12-07 LAB — RENAL FUNCTION PANEL
Albumin: 2.3 g/dL — ABNORMAL LOW (ref 3.5–5.0)
Anion gap: 13 (ref 5–15)
BUN: 39 mg/dL — ABNORMAL HIGH (ref 8–23)
CO2: 24 mmol/L (ref 22–32)
Calcium: 8.7 mg/dL — ABNORMAL LOW (ref 8.9–10.3)
Chloride: 95 mmol/L — ABNORMAL LOW (ref 98–111)
Creatinine, Ser: 6.49 mg/dL — ABNORMAL HIGH (ref 0.44–1.00)
GFR, Estimated: 6 mL/min — ABNORMAL LOW (ref >60)
Glucose, Bld: 82 mg/dL (ref 70–99)
Phosphorus: 4.1 mg/dL (ref 2.5–4.6)
Potassium: 4.4 mmol/L (ref 3.5–5.1)
Sodium: 132 mmol/L — ABNORMAL LOW (ref 135–145)

## 2022-12-07 LAB — MAGNESIUM: Magnesium: 1.4 mg/dL — ABNORMAL LOW (ref 1.7–2.4)

## 2022-12-07 MED ORDER — LABETALOL HCL 5 MG/ML IV SOLN: 5.0000 mg | INTRAVENOUS | Status: DC | PRN

## 2022-12-07 MED ORDER — HEPARIN SODIUM (PORCINE) 1000 UNIT/ML DIALYSIS
1000.0000 [IU] | INTRAMUSCULAR | Status: DC | PRN
  Filled 2022-12-07: qty 1

## 2022-12-07 MED ORDER — ALTEPLASE 2 MG IJ SOLR: 2.0000 mg | Freq: Once | INTRAMUSCULAR | Status: DC | PRN

## 2022-12-07 MED ORDER — PANTOPRAZOLE SODIUM 40 MG PO TBEC
40.0000 mg | DELAYED_RELEASE_TABLET | Freq: Every day | ORAL | Status: DC
  Administered 2022-12-07 – 2022-12-13 (×7): 40 mg via ORAL
  Filled 2022-12-07 (×7): qty 1

## 2022-12-07 MED ORDER — PENTAFLUOROPROP-TETRAFLUOROETH EX AERO: 1.0000 | INHALATION_SPRAY | CUTANEOUS | Status: DC | PRN

## 2022-12-07 MED ORDER — LIDOCAINE-PRILOCAINE 2.5-2.5 % EX CREA: 1.0000 | TOPICAL_CREAM | CUTANEOUS | Status: DC | PRN

## 2022-12-07 MED ORDER — LIDOCAINE HCL (PF) 1 % IJ SOLN: 5.0000 mL | INTRAMUSCULAR | Status: DC | PRN

## 2022-12-07 NOTE — [place] FL2 (Signed)
[place] MEDICAID FL2 LEVEL OF CARE FORM     IDENTIFICATION  Patient Name: Shawna [name] Birthdate: [birth year] Sex: female Admission Date (Current Location): 11/30/2022  [facility] [place] and [place] Number:  Producer, television/film/video and Address:  The . [facility], [address], [place], [place] [address]      Provider Number: [id number]  Attending Physician Name and Address:  Jerald Kief, MD  Relative Name and Phone Number:  Zakrzewski,Nathaniel (Spouse) [phone]    Current Level of Care: Hospital Recommended Level of Care: Skilled Nursing Facility Prior Approval Number:    Date Approved/Denied:   PASRR Number: [id number]  Discharge Plan: SNF    Current Diagnoses: Patient Active Problem List   Diagnosis Date Noted   Pressure injury of skin 12/01/2022   Rectal bleeding 11/30/2022   Generalized abdominal pain 11/10/2022   Ileus (HCC) 11/04/2022   Periumbilical abdominal pain 11/04/2022   Symptomatic anemia 10/29/2022   Anemia of chronic disease 10/29/2022   Upper GI bleed 10/29/2022   Iron deficiency anemia due to chronic blood loss 08/26/2022   Benign neoplasm of transverse colon 08/11/2022   Benign neoplasm of descending colon 08/11/2022   Benign neoplasm of sigmoid colon 08/11/2022   Angiodysplasia of duodenum 08/11/2022   Delirium 04/22/2022   History of arteriovenous malformation (AVM) 04/22/2022   Acute metabolic encephalopathy 11/09/2021   NSTEMI, initial episode of care (HCC) 10/19/2021   Acute on chronic blood loss anemia 09/09/2021   Ischemic cardiomyopathy    Atrial fibrillation, chronic (HCC) 05/07/2021   Neuropathy 04/23/2021   Anemia associated with chronic renal failure 04/13/2021   Left knee pain 03/15/2021   Moderate protein-calorie malnutrition (HCC) 02/27/2021   Hypokalemia 02/27/2021   COVID-19 virus infection 02/03/2021   Leukocytosis 02/03/2021   Hypothyroidism 02/03/2021   Myositis 12/03/2020   Ataxia 12/02/2020    Diabetes mellitus type 2 in nonobese (HCC) 11/10/2020   Failure to thrive in adult 11/09/2020   Dialysis AV fistula malfunction, initial encounter (HCC)    Jugular vein occlusion, right (HCC)    Failure of surgically constructed arteriovenous fistula (HCC) 10/03/2020   Myoclonus 08/31/2020   Clotted renal dialysis AV graft, initial encounter (HCC)    Hemodialysis-associated hypotension    Irritable bowel syndrome 02/25/2020   Adenomatous duodenal polyp 09/10/2019   History of GI bleed 09/10/2019   Hand steal syndrome (HCC) 08/01/2017   CAD (coronary artery disease) 06/05/2017   Intestinal ischemia (HCC)    Complication of vascular access for dialysis 03/19/2017   Preoperative clearance 01/25/2017   H/O non-ST elevation myocardial infarction (NSTEMI) 10/24/2016   Non-ST elevation (NSTEMI) myocardial infarction [facility])    Heme positive stool    Cardiac arrest [facility])    Palliative care encounter    Goals of care, counseling/discussion    Flash pulmonary edema (HCC) 04/06/2016   History of colon cancer 01/27/2016   History of ovarian cancer 01/27/2016   PAF (paroxysmal atrial fibrillation) (HCC) 10/14/2015   Malignant neoplasm of right ovary (HCC) 10/14/2015   SVT (supraventricular tachycardia) 09/08/2015   Essential hypertension    Dyspnea    Constipation 03/12/2013   Abdominal wall pain in left flank 03/12/2013   Occlusion and stenosis of carotid artery without mention of cerebral infarction 01/24/2013   Hx of CABG 07/05/2012   Carotid artery disease (HCC) 07/05/2012   Mitral regurgitation 06/12/2012   Non-STEMI (non-ST elevated myocardial infarction) (HCC) 06/08/2012   Chronic combined systolic and diastolic CHF (congestive heart failure) (HCC) 05/02/2012  AVM (arteriovenous malformation) of small bowel, acquired with hemorrhage 01/20/2012   GERD (gastroesophageal reflux disease) 01/09/2012   HLD (hyperlipidemia) 01/05/2012   Atherosclerotic heart disease of native coronary  artery without angina pectoris 12/16/2011   Anxiety disorder 05/04/2011   End-stage renal disease on hemodialysis (HCC) 04/29/2011   Gout 04/29/2011    Orientation RESPIRATION BLADDER Height & Weight     Self, Place, Situation  Normal Continent Weight: 124 lb 5.4 oz (56.4 kg) (bed) Height:  5\' 2"  (157.5 cm)  BEHAVIORAL SYMPTOMS/MOOD NEUROLOGICAL BOWEL NUTRITION STATUS      Continent Diet (see d/c summary)  AMBULATORY STATUS COMMUNICATION OF NEEDS Skin   Limited Assist Verbally PU Stage and Appropriate Care (Stage 2- Rectum;  open area coccyx mid)                       Personal Care Assistance Level of Assistance              Functional Limitations Info  Sight, Hearing, Speech Sight Info: Adequate Hearing Info: Adequate Speech Info: Adequate    SPECIAL CARE FACTORS FREQUENCY  PT (By licensed PT), OT (By licensed OT)     PT Frequency: 5x/ week OT Frequency: 5x/ week            Contractures Contractures Info: Not present    Additional Factors Info  Code Status, Allergies, Isolation Precautions Code Status Info: Full Allergies Info: Amlodipine  Aspirin  Nitrofurantoin  Ranexa (Ranolazine)  Bactrim (Sulfamethoxazole-trimethoprim)  Contrast Media (Iodinated Contrast Media)  Iron  Gabapentin  Iron Sucrose  Sucroferric Oxyhydroxide  Levaquin (Levofloxacin In D5w)  Pantoprazole  Plavix (Clopidogrel Bisulfate)  Protonix (Pantoprazole Sodium)  Venofer (Ferric Oxide)     Isolation Precautions Info: enteric pre     Current Medications (12/07/2022):  This is the current hospital active medication list Current Facility-Administered Medications  Medication Dose Route Frequency Provider Last Rate Last Admin   acetaminophen (TYLENOL) tablet 650 mg  650 mg Oral Q6H PRN Dow Adolph N, DO   650 mg at 12/07/22 1030   amiodarone (PACERONE) tablet 100 mg  100 mg Oral Daily Pokhrel, Laxman, MD   100 mg at 12/07/22 1159   atorvastatin (LIPITOR) tablet 10 mg  10 mg Oral QHS Hall,  Carole N, DO   10 mg at 12/06/22 2153   carvedilol (COREG) tablet 6.25 mg  6.25 mg Oral BID WC Berton Mount I, MD   6.25 mg at 12/07/22 1446   Chlorhexidine Gluconate Cloth 2 % PADS 6 each  6 each Topical Q0600 Zetta Bills, MD   6 each at 12/07/22 1200   folic acid (FOLVITE) tablet 1 mg  1 mg Oral Daily Richardton, Carole N, DO   1 mg at 12/07/22 1159   gabapentin (NEURONTIN) capsule 100 mg  100 mg Oral QHS Hall, Carole N, DO   100 mg at 12/06/22 2152   guaiFENesin (MUCINEX) 12 hr tablet 600 mg  600 mg Oral BID PRN Berton Mount I, MD   600 mg at 12/05/22 1832   hydrALAZINE (APRESOLINE) injection 10 mg  10 mg Intravenous Q6H PRN Pokhrel, Laxman, MD   10 mg at 11/30/22 1845   hydrALAZINE (APRESOLINE) tablet 50 mg  50 mg Oral Q8H Berton Mount I, MD   50 mg at 12/07/22 1312   hydrOXYzine (ATARAX) tablet 25 mg  25 mg Oral Q6H PRN Dow Adolph N, DO       labetalol (NORMODYNE) injection 5 mg  5  mg Intravenous Q2H PRN Jerald Kief, MD       levETIRAcetam (KEPPRA) tablet 500 mg  500 mg Oral Daily Dow Adolph N, DO   500 mg at 12/07/22 1159   levothyroxine (SYNTHROID) tablet 75 mcg  75 mcg Oral QAC breakfast Darlin Drop, DO   75 mcg at 12/07/22 1159   pantoprazole (PROTONIX) EC tablet 40 mg  40 mg Oral Daily Jerald Kief, MD   40 mg at 12/07/22 1200   prochlorperazine (COMPAZINE) injection 5 mg  5 mg Intravenous Q6H PRN Dow Adolph N, DO       sevelamer carbonate (RENVELA) tablet 3,200 mg  3,200 mg Oral TID WC Hall, Carole N, DO   3,200 mg at 12/07/22 1159     Discharge Medications: Please see discharge summary for a list of discharge medications.  Relevant Imaging Results:  Relevant Lab Results:   Additional Information ss#[id number].  Dialysis patient MWF at [facility] (10:30am arrival time, 10:45am chair).  Catalina Pizza Dymond Gutt, LCSW

## 2022-12-07 NOTE — Progress Notes (Signed)
Progress Note   Patient: Shawna [name]:[id number] DOB: 08-Oct-1940 DOA: 11/30/2022     7 DOS: the patient was seen and examined on 12/07/2022   Brief hospital course: Shawna [name] was admitted to the hospital with the working diagnosis of recurrent upper gastrointestinal bleeding.    82 y.o. female with past medical history of end-stage renal disease on hemodialysis Monday Wednesday Friday, seizure disorder, paroxysmal atrial fibrillation not on anticoagulation secondary to history of GI bleed, hypertension, hyperlipidemia, CAD status post CABG and PCI, hypothyroidism, small bowel AVM who was recently admitted for  recurrent GI bleeding with acute on chronic blood loss anemia, to the hospital with frequent loose bowel movements for last 3 days with some blood when wiping wiping. On her initial physical examination her blood pressure was 156/62, HR 62, RR 19 and 02 saturation 98%, lungs with no wheezing or rales, heart with S1 and S2 present irregularly irregular, abdomen with no distention or tenderness, no lower extremity edema.    Na 135, K 3,4 Cl 101, bicarbonate 14 glucose 118, bun 59 cr 7,84 AST 6 ALT 11  Wbc 1,98 hgb 7,1 plt 261  C diff antigen positive, C diff toxin negative, PCR negative.  GI panel infectious negative.    Chest radiograph with cardiomegaly, small bilateral pleural effusions, sternotomy wires in place, HD catheter left subclavian vein.    EKG 113 bpm, left axis deviation, left bundle branch block, prolonged qtc, atrial fibrillation rhythm with no significant ST segment or T wave changes.    Fecal occult test was positive.   1 unit of packed RBC was transfused.    GI consulted and patient underwent small bowel enteroscopy, finding numerous jejunal AVMs noted and treated with APC.  Assessment and Plan: AVM (arteriovenous malformation) of small bowel, acquired with hemorrhage Patient requires one unit PRBC transfusion with good toleration.   Small bowel enteroscopy  with jejunal diverticulum several. Six non bleeding angiodysplastic lesions in the proximal jejunum treated with argon plasma coagulation.    Plan to continue antiacid therapy and outpatient octreotide injections.  Follow up with Dr Levon Hedger as outpatient from GI.  Hgb had remained stable     End-stage renal disease on hemodialysis [facility]) Patient underwent renal replacement therapy during her hospitalization.  Her discharge BUN is 28 down from 57. She will continue renal replacement therapy with Monday, Wednesday and Friday schedule.    Essential hypertension Continue blood pressure control with carvedilol and hydralazine.  Hold on Lisinopril for now.    PAF (paroxysmal atrial fibrillation) (HCC) Continue rate control with carvedilol, no anticoagulation or antiplatelet therapy due to recurrent GI bleeding. Tachycardic this afternoon. PRN labetalol ordered   Hx of CABG No chest pain.  Continue blood pressure control.    Diabetes mellitus type 2 in nonobese (HCC) Glucose remained stable during her hospitalization. She was placed on insulin sliding scale.    Pressure injury of skin Pressure Injury 11/30/22 Rectum Stage 2 - Partial thickness loss of dermis presenting as a shallow open injury with a red, pink wound bed without slough. open shallow ulcer with pink wound bed    Hypothyroidism Continue with levothyroxine.       Subjective: Pleasantly confused this afternoon  Physical Exam: Vitals:   12/07/22 1102 12/07/22 1105 12/07/22 1125 12/07/22 1427  BP:  (!) 123/56 118/63 (!) 94/45  Pulse:  84 84 (!) 133  Resp:  (!) 21 19 18   Temp:   98.3 F (36.8 C) 98.2 F (36.8 C)  TempSrc:   Oral Oral  SpO2:  99% 98% 100%  Weight: 56.4 kg     Height:       General exam: Awake, laying in bed, in nad Respiratory system: Normal respiratory effort, no wheezing Cardiovascular system: regular rate, s1, s2 Gastrointestinal system: Soft, nondistended, positive BS Central nervous  system: CN2-12 grossly intact, strength intact Extremities: Perfused, no clubbing Skin: Normal skin turgor, no notable skin lesions seen Psychiatry: Mood normal // no visual hallucinations   Data Reviewed:  Labs reviewed: na 132, K 4.4, Cr 6.49  Family Communication: Pt in room, family not at bedside  Disposition: Status is: Inpatient Remains inpatient appropriate because: Severity of illness  Planned Discharge Destination: Skilled nursing facility     Author: Rickey Barbara, MD 12/07/2022 3:22 PM  For on call review [email or web address].

## 2022-12-07 NOTE — Progress Notes (Signed)
Received patient in bed to unit.  Alert and oriented.  Informed consent signed and in chart.   TX duration:3.5  Patient tolerated well.  Transported back to the room  Alert, without acute distress.  Hand-off given to patient's nurse.   Access used: left [facility] Access issues: none  Total UF removed: 1.5L Medication(s) given: tylenol   12/07/22 1100  Vitals  Temp 98 F (36.7 C)  Temp Source Oral  BP (!) 131/48  MAP (mmHg) 72  BP Location Right Arm  BP Method Automatic  Patient Position (if appropriate) Lying  Pulse Rate 80  Pulse Rate Source Monitor  ECG Heart Rate 80  Resp 18  Oxygen Therapy  SpO2 100 %  O2 Device Room Air  During Treatment Monitoring  HD Safety Checks Performed Yes  Intra-Hemodialysis Comments Tolerated well  Dialysis Fluid Bolus Normal Saline  Bolus Amount (mL) 300 mL  Fistula / Graft Left Upper arm Arteriovenous fistula  Placement Date: 03/19/19   Orientation: Left  Access Location: Upper arm  Access Type: Arteriovenous fistula  Site Condition Other (Comment) (not working)  Fistula / Graft Assessment Absent;Thrill;Bruit  Drainage Description None  Hemodialysis Catheter Left Subclavian Double lumen Temporary (Non-Tunneled)  Placement Date: 06/09/22   Placed prior to admission: Yes  Orientation: Left  Access Location: Subclavian  Hemodialysis Catheter Type: Double lumen Temporary (Non-Tunneled)  Site Condition No complications  Blue Lumen Status Flushed;Heparin locked  Red Lumen Status Flushed;Heparin locked  Purple Lumen Status N/A  Catheter fill solution Heparin 1000 units/ml  Catheter fill volume (Arterial) 1.9 cc  Catheter fill volume (Venous) 1.9  Dressing Type Transparent  Dressing Status Antimicrobial disc in place;Clean, Dry, Intact  Drainage Description None  Dressing Change Due 12/14/22  Post treatment catheter status Capped and Clamped      Lorine Iannaccone [facility]

## 2022-12-07 NOTE — Progress Notes (Signed)
HR 132, ST, BP 94/45, pt. Asymptomatic, MD aware, ok to give 1700 dose of Coreg now, see new order  Margarita Grizzle

## 2022-12-07 NOTE — Telephone Encounter (Signed)
I called the patient home her husband informed me the patient was back in the hospital at [facility]

## 2022-12-07 NOTE — Plan of Care (Signed)
  Problem: Health Behavior/Discharge Planning: Goal: Ability to manage health-related needs will improve Outcome: Progressing   Problem: Clinical Measurements: Goal: Ability to maintain clinical measurements within normal limits will improve Outcome: Progressing Goal: Will remain free from infection Outcome: Progressing Goal: Diagnostic test results will improve Outcome: Progressing Goal: Respiratory complications will improve Outcome: Progressing Goal: Cardiovascular complication will be avoided Outcome: Progressing   Problem: Activity: Goal: Risk for activity intolerance will decrease Outcome: Progressing   Problem: Nutrition: Goal: Adequate nutrition will be maintained Outcome: Progressing   Problem: Coping: Goal: Level of anxiety will decrease Outcome: Progressing   Problem: Elimination: Goal: Will not experience complications related to bowel motility Outcome: Progressing Goal: Will not experience complications related to urinary retention Outcome: Progressing   Problem: Pain Managment: Goal: General experience of comfort will improve Outcome: Progressing   Problem: Safety: Goal: Ability to remain free from injury will improve Outcome: Progressing   Problem: Skin Integrity: Goal: Risk for impaired skin integrity will decrease Outcome: Progressing   

## 2022-12-07 NOTE — Progress Notes (Addendum)
[facility] Progress Note   Subjective:    Seen and examined patient on HD. She is tolerating UFG 1.5L. BP 140/46. She denies any acute complaints at this time. Per previous notes, patient's husband is requesting patient go to a SNF for short-term.  Objective Vitals:   12/07/22 0830 12/07/22 0900 12/07/22 0930 12/07/22 1000  BP: (!) 138/59 (!) 140/49 (!) 124/54 (!) 108/48  Pulse: 80 78 81 82  Resp: 18 20 16  (!) 24  Temp:      TempSrc:      SpO2: 99% 100% 100% 100%  Weight:      Height:       Physical Exam General: Awake, alert, on RA, NAD Heart: S1 and S2; No murmurs, gallops, or rubs Lungs:Clear anteriorly; No wheezing, rales, and rhonchi Abdomen: Soft and non-tender Extremities: No LE edema Dialysis Access: LIJ TDC; L AVG (thrombosed)   Filed Weights   12/05/22 0835 12/06/22 1932 12/07/22 0715  Weight: 59.1 kg 60 kg 57.6 kg    Intake/Output Summary (Last 24 hours) at 12/07/2022 1008 Last data filed at 12/07/2022 0600 Gross per 24 hour  Intake 680 ml  Output 0 ml  Net 680 ml    Additional Objective Labs: Basic Metabolic Panel: Recent Labs  Lab 12/05/22 0859 12/06/22 0235 12/07/22 0703  NA 132* 131* 132*  K 4.7 4.2 4.4  CL 96* 95* 95*  CO2 22 24 24   GLUCOSE 142* 101* 82  BUN 57* 28* 39*  CREATININE 7.89* 4.46* 6.49*  CALCIUM 8.3* 8.1* 8.7*  PHOS 4.8* 3.1 4.1   Liver Function Tests: Recent Labs  Lab 12/05/22 0859 12/06/22 0235 12/07/22 0703  ALBUMIN 2.3* 2.2* 2.3*   No results for input(s): "LIPASE", "AMYLASE" in the last 168 hours. CBC: Recent Labs  Lab 12/01/22 0751 12/02/22 0800 12/03/22 0301 12/05/22 0900 12/06/22 0235  WBC 6.5 7.1 6.9 6.8 6.7  NEUTROABS  --   --   --   --  5.1  HGB 7.8* 7.4* 7.5* 7.3* 7.7*  HCT 23.0* 22.3* 23.0* 22.9* 23.5*  MCV 101.8* 106.7* 105.5* 107.5* 109.3*  PLT 198 187 202 239 206   Blood Culture    Component Value Date/Time   SDES BLOOD LEFT HAND 12/04/2022 2013   SPECREQUEST  12/04/2022  2013    BOTTLES DRAWN AEROBIC AND ANAEROBIC Blood Culture adequate volume   CULT  12/04/2022 2013    NO GROWTH 3 DAYS Performed at [facility], [address] [place], [place] [address]    REPTSTATUS PENDING 12/04/2022 2013    Cardiac Enzymes: No results for input(s): "CKTOTAL", "CKMB", "CKMBINDEX", "TROPONINI" in the last 168 hours. CBG: Recent Labs  Lab 12/01/22 0852 12/01/22 0913 12/05/22 1528 12/06/22 1637 12/06/22 2043  GLUCAP 68* 79 134* 88 167*   Iron Studies: No results for input(s): "IRON", "TIBC", "TRANSFERRIN", "FERRITIN" in the last 72 hours. Lab Results  Component Value Date   INR 1.3 (H) 11/08/2021   INR 1.0 02/03/2021   INR 1.1 06/12/2020   Studies/Results: No results found.  Medications:   amiodarone  100 mg Oral Daily   atorvastatin  10 mg Oral QHS   carvedilol  6.25 mg Oral BID WC   Chlorhexidine Gluconate Cloth  6 each Topical Q0600   folic acid  1 mg Oral Daily   gabapentin  100 mg Oral QHS   heparin sodium (porcine)  3,800 Units Intracatheter Once   hydrALAZINE  50 mg Oral Q8H   levETIRAcetam  500  mg Oral Daily   levothyroxine  75 mcg Oral QAC breakfast   pantoprazole (PROTONIX) IV  40 mg Intravenous Q12H   sevelamer carbonate  3,200 mg Oral TID WC    Dialysis Orders: She receives her HD at [facility] M[facility]   Assessment/Plan: 1.  Acute blood loss anemia secondary to rectal bleeding: Recurrent issue with EGD 5/23 and APC of jejunal arteriovenous malformations/ angiodysplasia by Dr. Adela Lank.  She has not had any additional hematochezia or melena overnight . Transfused 1 unit prbcs on 5/22.  Hemoglobin now 7.7. On high dose ESA at her outpatient clinic - got Mircera 200 on 5/20.  2.  End-stage renal disease: Usually on a Monday/Wednesday/Friday schedule. On HD. 3.  Hypokalemia: Likely secondary to limited intake and diarrhea prior to admission-improved with supplementation and 4K dialysate. Resolved. K+ now 4.4 4.  Secondary  hyperparathyroidism: On sevelamer for phosphorus binding with acceptable calcium and phosphorus level. 5.  Hypertension: Blood pressures variable.  Resumed on oral antihypertensive therapy and monitor with ultrafiltration on hemodialysis. 6.  Atrial fibrillation: She is currently rate controlled and appears to be in sinus rhythm, not a candidate for anticoagulation due to recurrent GI bleed. 7. Dispo: Patient's husband is requesting she be sent to a SNF for short-term.   Salome Holmes, NP [facility] 12/07/2022,10:08 AM  LOS: 7 days

## 2022-12-07 NOTE — Telephone Encounter (Signed)
Shawna [name] came by the office today, says they are on track with patient, and will update [place] with new information as it comes in.

## 2022-12-08 DIAGNOSIS — D649 Anemia, unspecified: Secondary | ICD-10-CM | POA: Diagnosis not present

## 2022-12-08 DIAGNOSIS — R197 Diarrhea, unspecified: Secondary | ICD-10-CM | POA: Diagnosis not present

## 2022-12-08 LAB — CBC WITH DIFFERENTIAL/PLATELET
Abs Immature Granulocytes: 0 10*3/uL (ref 0.00–0.07)
Basophils Absolute: 0.3 10*3/uL — ABNORMAL HIGH (ref 0.0–0.1)
Basophils Relative: 4 %
Eosinophils Absolute: 0.2 10*3/uL (ref 0.0–0.5)
Eosinophils Relative: 3 %
HCT: 23.5 % — ABNORMAL LOW (ref 36.0–46.0)
Hemoglobin: 7.3 g/dL — ABNORMAL LOW (ref 12.0–15.0)
Lymphocytes Relative: 10 %
Lymphs Abs: 0.6 10*3/uL — ABNORMAL LOW (ref 0.7–4.0)
MCH: 34.1 pg — ABNORMAL HIGH (ref 26.0–34.0)
MCHC: 31.1 g/dL (ref 30.0–36.0)
MCV: 109.8 fL — ABNORMAL HIGH (ref 80.0–100.0)
Monocytes Absolute: 0.3 10*3/uL (ref 0.1–1.0)
Monocytes Relative: 5 %
Neutro Abs: 4.9 10*3/uL (ref 1.7–7.7)
Neutrophils Relative %: 78 %
Platelets: 214 10*3/uL (ref 150–400)
RBC: 2.14 MIL/uL — ABNORMAL LOW (ref 3.87–5.11)
RDW: 22.6 % — ABNORMAL HIGH (ref 11.5–15.5)
WBC: 6.3 10*3/uL (ref 4.0–10.5)
nRBC: 0 % (ref 0.0–0.2)
nRBC: 0 /100 WBC

## 2022-12-08 LAB — GLUCOSE, CAPILLARY: Glucose-Capillary: 92 mg/dL (ref 70–99)

## 2022-12-08 LAB — CULTURE, BLOOD (ROUTINE X 2): Culture: NO GROWTH

## 2022-12-08 MED ORDER — HEPARIN SODIUM (PORCINE) 1000 UNIT/ML DIALYSIS
1000.0000 [IU] | INTRAMUSCULAR | Status: DC | PRN
  Administered 2022-12-09: 1000 [IU] via INTRAVENOUS_CENTRAL
  Filled 2022-12-08 (×2): qty 1

## 2022-12-08 MED ORDER — LIDOCAINE-PRILOCAINE 2.5-2.5 % EX CREA: 1.0000 | TOPICAL_CREAM | CUTANEOUS | Status: DC | PRN

## 2022-12-08 MED ORDER — ALTEPLASE 2 MG IJ SOLR: 2.0000 mg | Freq: Once | INTRAMUSCULAR | Status: DC | PRN

## 2022-12-08 MED ORDER — LIDOCAINE HCL (PF) 1 % IJ SOLN: 5.0000 mL | INTRAMUSCULAR | Status: DC | PRN

## 2022-12-08 MED ORDER — MAGNESIUM SULFATE 4 GM/100ML IV SOLN
4.0000 g | Freq: Once | INTRAVENOUS | Status: AC
  Administered 2022-12-08: 4 g via INTRAVENOUS
  Filled 2022-12-08: qty 100

## 2022-12-08 MED ORDER — PENTAFLUOROPROP-TETRAFLUOROETH EX AERO: 1.0000 | INHALATION_SPRAY | CUTANEOUS | Status: DC | PRN

## 2022-12-08 NOTE — Telephone Encounter (Signed)
Shawna [name] from [facility] called today says the patient co pay for the Generic sandostatin is $1,137.29. Patient does not qualify for co pay card as she is on medicare. They will try to get free drug with the manufacture and will let us know if they need any thing else. I did advise the patient was currently hospitalized and we would let them know of any changes.

## 2022-12-08 NOTE — Progress Notes (Signed)
Occupational Therapy Treatment Patient Details Name: Shawna [name] MRN: [id number] DOB: 1940/09/25 Today's Date: 12/08/2022   History of present illness 82 yo female admitted due to rectal bleed and x4 weeks of diarrhea.  PMH ESRD on HD MWF, seizure disorder, afib, HTN, HLD, CAD s/p CABG and PCI, hypothyroidism, , Small bowel AVMS, GIB, Hx of colon CA, TIA, PUD, Gout.   OT comments  Pt supine in bed with HOB elevated and husband present upon OT arrival. Pt was agreeable to participation in skilled OT session and made good attempt to participate throughout session. However, pt currently presents with increased lethargy, fatigue, and frequently falling asleep throughout OT session. Prior to this session, pt had been making good progress toward OT goals. However, this session, pt presents with limited ability to participate and decline in safety and independence with ADLs, functional transfers, and functional mobility due to alertness level. On this day, pt required Min to Mod assist for bed mobility, Min assist for sit/stand transfers and to maintain balance in sitting and standing, and Max assist +2 for safety for toileting hygiene sit/stand. MD and RN are aware of pt status. Pt will benefit from continued acute skilled OT services to address deficits and increase safety and independence with ADLs and functional transfers/mobility. OT to reassess pt next session to ensure goals and discharge plan remain appropriate.    Recommendations for follow up therapy are one component of a multi-disciplinary discharge planning process, led by the attending physician.  Recommendations may be updated based on patient status, additional functional criteria and insurance authorization.    Assistance Recommended at Discharge Frequent or constant Supervision/Assistance  Patient can return home with the following  Two people to help with walking and/or transfers;Two people to help with bathing/dressing/bathroom;Assistance  with cooking/housework;Direct supervision/assist for medications management;Direct supervision/assist for financial management;Assist for transportation;Help with stairs or ramp for entrance   Equipment Recommendations       Recommendations for Other Services      Precautions / Restrictions Precautions Precautions: Fall Precaution Comments: R femoral catheter 5/4 Restrictions Weight Bearing Restrictions: No       Mobility Bed Mobility Overal bed mobility: Needs Assistance Bed Mobility: Supine to Sit, Sit to Supine     Supine to sit: Mod assist, HOB elevated Sit to supine: Mod assist   General bed mobility comments: Pt with decreased independence with bed mobility this day secondary to lethergy and low level of alertness.    Transfers Overall transfer level: Needs assistance Equipment used: Rolling walker (2 wheels) Transfers: Sit to/from Stand Sit to Stand: Min assist           General transfer comment: Pt with decreased independence with functional transfers this day secondary to lethergy and low level of alertness.     Balance Overall balance assessment: Needs assistance Sitting-balance support: Feet supported, Bilateral upper extremity supported Sitting balance-Leahy Scale: Poor Sitting balance - Comments: Pt with decreased siting balance this day with noted significant lethergy and frequent low level of alertness.   Standing balance support: Bilateral upper extremity supported, During functional activity, Reliant on assistive device for balance Standing balance-Leahy Scale: Poor Standing balance comment: Pt with decreased standing balance this day with noted significant lethergy and frequent low level of alertness.                           ADL either performed or assessed with clinical judgement   ADL Overall ADL's : Needs assistance/impaired  Toileting- Clothing Manipulation and Hygiene: Maximal  assistance;+2 for safety/equipment;Sit to/from stand Toileting - Clothing Manipulation Details (indicate cue type and reason): Pt stood at EOB for toileting hygeine following an episode of bowel incontinence in bed. Pt requierd +2 assist for safety secondary to pt lethergy and falling asleep in standing.       General ADL Comments: Pt requiring an increased level of assistance with ADLs this session secondary to lethergy and low level of alertness.    Extremity/Trunk Assessment Upper Extremity Assessment Upper Extremity Assessment: Generalized weakness (worse on R this session)   Lower Extremity Assessment Lower Extremity Assessment: Defer to PT evaluation        Vision       Perception     Praxis      Cognition Arousal/Alertness: Lethargic Behavior During Therapy: WFL for tasks assessed/performed Overall Cognitive Status: History of cognitive impairments - at baseline                                 General Comments: Pt with significant lethergy this session. Pt fell asleep sitting EOB and in standing this session. Pt was easily alerted with verbal cues but quickly fell back to sleep.        Exercises      Shoulder Instructions       General Comments Pt limited this session due to significant lethergy and frequent low level of alertness throughout session. Pt's husband present throughout session.    Pertinent Vitals/ Pain       Pain Assessment Pain Assessment: No/denies pain  Home Living                                          Prior Functioning/Environment              Frequency  Min 2X/week        Progress Toward Goals  OT Goals(current goals can now be found in the care plan section)  Progress towards OT goals: Not progressing toward goals - comment;OT to reassess next treatment (Pt had been making good progress toward OT goals. However, this session, pt presents with increased lethergy and decreased alertness level  leading to decline in safety and independence with ADLs this session. OT to reassess pt progress next session.)  Acute Rehab OT Goals Patient Stated Goal: To have more energy and not be so tired OT Goal Formulation: With patient  Plan Other (comment) (OT to reassess next session secondary to change in pt alertness and ability to participate is skilled OT session this day.)    Co-evaluation                 AM-PAC OT "6 Clicks" Daily Activity     Outcome Measure   Help from another person eating meals?: None Help from another person taking care of personal grooming?: A Little Help from another person toileting, which includes using toliet, bedpan, or urinal?: Total Help from another person bathing (including washing, rinsing, drying)?: A Lot Help from another person to put on and taking off regular upper body clothing?: A Lot Help from another person to put on and taking off regular lower body clothing?: Total 6 Click Score: 13    End of Session Equipment Utilized During Treatment: Gait belt;Rolling walker (2 wheels)  OT Visit Diagnosis: Unsteadiness on  feet (R26.81);Other abnormalities of gait and mobility (R26.89);Muscle weakness (generalized) (M62.81);Pain   Activity Tolerance Patient limited by fatigue;Patient limited by lethargy;Other (comment) (Pt limited by frequent low level of alertness.)   Patient Left in bed;with call bell/phone within reach;with bed alarm set;with family/visitor present   Nurse Communication Mobility status;Other (comment) (Decreased safety and independence this session. Increased lethergy, fatigue, and frequent low level of alertness this session with RN reporting MD is aware.)        Time: 1610-9604 OT Time Calculation (min): 18 min  Charges: OT General Charges $OT Visit: 1 Visit OT Treatments $Self Care/Home Management : 8-22 mins  Shawna [name] "Orson Eva., OTR/L, MA Acute Rehab [phone]   Lendon Colonel 12/08/2022, 6:38 PM

## 2022-12-08 NOTE — TOC Progression Note (Addendum)
Transition of Care [facility]) - Initial/Assessment Note    Patient Details  Name: Shawna [name] MRN: [id number] Date of Birth: Dec 15, 1940  Transition of Care [facility]) CM/SW Contact:    Ralene Bathe, LCSW Phone Number: 12/08/2022, 8:58 AM  Clinical Narrative:                 LCSW met with the patient at bedside.  The patient was drowsy, but answered questions appropriately.  LCSW inquired as to whether the patient is agreeable to receiving short term physical therapy at a skilled nursing facility.  The patient replied "no. I want to go home." LCSW inquired as to whether the patient has spoken to her spouse in reference to this and the patient reports that the spouse is aware that she does not want to go to a facility.   LCSW contacted the patient's husband.  He reports that he and the patient agreed that patient would go to a facility.  LCSW encouraged the spouse to speak with patient as patient is alert and oriented and if she refuses SNF, LCSW will have to follow her wishes.  The spouse plans to speak with the patient and LCSW will follow up with the family this afternoon.  Addendum 13:38-  LCSW went to meet with patient at bedside.  Patient is still very sleepy and falls asleep during conversation.  LCSW will attempt to receive a more definite answer in reference to SNF placement when patient is more alert.   14:20-  LCSW informed by MD that during MD's encounter with patient, patient reported that she did not know who she was.  As patient's mentation appears to be waxing and waning, LCSW contacted the patient's husband.  The husband reports that the patient has been "confused a lot" at home and he and the daughter have been assisting the patient.  The husband continues to request that the patient go to a SNF to receive more assistance.  LCSW presented bed offers and the spouse choose [facility].  LCSW contacted admissions at [facility] to secure bed offer and is awaiting a response.  TOC  following.     Barriers to Discharge: Continued Medical Work up   Patient Goals and CMS Choice Patient states their goals for this hospitalization and ongoing recovery are:: Husband requests rehab then return home. CMS Medicare.gov Compare Post Acute Care list provided to:: Patient Choice offered to / list presented to : Patient, Spouse      Expected Discharge Plan and Services       Living arrangements for the past 2 months: Single Family Home Expected Discharge Date: 12/06/22                                    Prior Living Arrangements/Services Living arrangements for the past 2 months: Single Family Home Lives with:: Spouse Patient language and need for interpreter reviewed:: Yes Do you feel safe going back to the place where you live?: Yes      Need for Family Participation in Patient Care: Yes (Comment) Care giver support system in place?: Yes (comment) Current home services: DME (Cane, walker, rollator, shower seat, grab bars, wheelchair.) Criminal Activity/Legal Involvement Pertinent to Current Situation/Hospitalization: No - Comment as needed  Activities of Daily Living Home Assistive Devices/Equipment: Cane (specify quad or straight) ADL Screening (condition at time of admission) Patient's cognitive ability adequate to safely complete daily activities?: Yes Is  the patient deaf or have difficulty hearing?: No Does the patient have difficulty seeing, even when wearing glasses/contacts?: No Does the patient have difficulty concentrating, remembering, or making decisions?: No Patient able to express need for assistance with ADLs?: No Does the patient have difficulty dressing or bathing?: No Independently performs ADLs?: Yes (appropriate for developmental age) Does the patient have difficulty walking or climbing stairs?: No Weakness of Legs: None Weakness of Arms/Hands: None  Permission Sought/Granted Permission sought to share information with : Case  Manager, Family Supports Permission granted to share information with : Yes, Verbal Permission Granted              Emotional Assessment Appearance:: Appears stated age Attitude/Demeanor/Rapport: Engaged, Gracious Affect (typically observed): Accepting, Appropriate, Calm, Hopeful, Pleasant Orientation: : Oriented to Self, Oriented to Place Alcohol / Substance Use: Not Applicable Psych Involvement: No (comment)  Admission diagnosis:  Rectal bleeding [K62.5] Symptomatic anemia [D64.9] Diarrhea, unspecified type [R19.7] Patient Active Problem List   Diagnosis Date Noted   Pressure injury of skin 12/01/2022   Rectal bleeding 11/30/2022   Generalized abdominal pain 11/10/2022   Ileus (HCC) 11/04/2022   Periumbilical abdominal pain 11/04/2022   Symptomatic anemia 10/29/2022   Anemia of chronic disease 10/29/2022   Upper GI bleed 10/29/2022   Iron deficiency anemia due to chronic blood loss 08/26/2022   Benign neoplasm of transverse colon 08/11/2022   Benign neoplasm of descending colon 08/11/2022   Benign neoplasm of sigmoid colon 08/11/2022   Angiodysplasia of duodenum 08/11/2022   Delirium 04/22/2022   History of arteriovenous malformation (AVM) 04/22/2022   Acute metabolic encephalopathy 11/09/2021   NSTEMI, initial episode of care (HCC) 10/19/2021   Acute on chronic blood loss anemia 09/09/2021   Ischemic cardiomyopathy    Atrial fibrillation, chronic (HCC) 05/07/2021   Neuropathy 04/23/2021   Anemia associated with chronic renal failure 04/13/2021   Left knee pain 03/15/2021   Moderate protein-calorie malnutrition (HCC) 02/27/2021   Hypokalemia 02/27/2021   COVID-19 virus infection 02/03/2021   Leukocytosis 02/03/2021   Hypothyroidism 02/03/2021   Myositis 12/03/2020   Ataxia 12/02/2020   Diabetes mellitus type 2 in nonobese (HCC) 11/10/2020   Failure to thrive in adult 11/09/2020   Dialysis AV fistula malfunction, initial encounter (HCC)    Jugular vein occlusion,  right (HCC)    Failure of surgically constructed arteriovenous fistula (HCC) 10/03/2020   Myoclonus 08/31/2020   Clotted renal dialysis AV graft, initial encounter (HCC)    Hemodialysis-associated hypotension    Irritable bowel syndrome 02/25/2020   Adenomatous duodenal polyp 09/10/2019   History of GI bleed 09/10/2019   Hand steal syndrome (HCC) 08/01/2017   CAD (coronary artery disease) 06/05/2017   Intestinal ischemia (HCC)    Complication of vascular access for dialysis 03/19/2017   Preoperative clearance 01/25/2017   H/O non-ST elevation myocardial infarction (NSTEMI) 10/24/2016   Non-ST elevation (NSTEMI) myocardial infarction [facility])    Heme positive stool    Cardiac arrest [facility])    Palliative care encounter    Goals of care, counseling/discussion    Flash pulmonary edema (HCC) 04/06/2016   History of colon cancer 01/27/2016   History of ovarian cancer 01/27/2016   PAF (paroxysmal atrial fibrillation) (HCC) 10/14/2015   Malignant neoplasm of right ovary (HCC) 10/14/2015   SVT (supraventricular tachycardia) 09/08/2015   Essential hypertension    Dyspnea    Constipation 03/12/2013   Abdominal wall pain in left flank 03/12/2013   Occlusion and stenosis of carotid artery without mention of  cerebral infarction 01/24/2013   Hx of CABG 07/05/2012   Carotid artery disease (HCC) 07/05/2012   Mitral regurgitation 06/12/2012   Non-STEMI (non-ST elevated myocardial infarction) (HCC) 06/08/2012   Chronic combined systolic and diastolic CHF (congestive heart failure) (HCC) 05/02/2012   AVM (arteriovenous malformation) of small bowel, acquired with hemorrhage 01/20/2012   GERD (gastroesophageal reflux disease) 01/09/2012   HLD (hyperlipidemia) 01/05/2012   Atherosclerotic heart disease of native coronary artery without angina pectoris 12/16/2011   Anxiety disorder 05/04/2011   End-stage renal disease on hemodialysis (HCC) 04/29/2011   Gout 04/29/2011   PCP:  [facility]  Family Pharmacy:   [facility] [address], [place] - [address] [place] [address] Phone: [phone] Fax: [phone]  [facility] (ESRD Bundle Only) - [place], [place] - [address] [place] [address] Phone: [phone] Fax: [phone]  [facility] - [address] [place], [place] - [address] [place] [address] Phone: [phone] Fax: [phone]  [facility] [address] [place] [address] Phone: [phone] Fax: [phone]     Social Determinants of Health (SDOH) Social History: SDOH Screenings   Food Insecurity: No Food Insecurity (10/29/2022)  Housing: Low Risk  (10/29/2022)  Transportation Needs: No Transportation Needs (10/29/2022)  Utilities: Not At Risk (10/29/2022)  Financial Resource Strain: Low Risk  (12/14/2018)  Physical Activity: Unknown (12/14/2018)  Social Connections: Unknown (12/14/2018)  Stress: No Stress Concern Present (12/14/2018)  Tobacco Use: Low Risk  (12/04/2022)   SDOH Interventions: Transportation Interventions: Intervention Not Indicated, Inpatient TOC, Patient Resources (Friends/Family)   Readmission Risk Interventions    10/31/2022   12:45 PM 08/29/2022    4:47 PM 11/12/2021    8:56 AM  Readmission Risk Prevention Plan  Transportation Screening Complete Complete Complete  Medication Review [employer]) Complete Complete Complete  PCP or Specialist appointment within 3-5 days of discharge  Complete Complete  HRI or Home Care Consult Complete Complete Complete  SW Recovery Care/Counseling Consult Complete Complete Complete  Palliative Care Screening Not Applicable Not Applicable Not Applicable  Skilled Nursing Facility Not Applicable Complete Complete

## 2022-12-08 NOTE — Progress Notes (Signed)
Patient appears more drowsy than usual upon assessment earlier today. I will also stop her low-dose Gabapentin.   Salome Holmes, NP [facility]

## 2022-12-08 NOTE — Progress Notes (Addendum)
[facility] Progress Note   Subjective:    Seen and examined patient at bedside. She appears more sleepy than usual. She was dozing off during our conversation today. She is however responding to questions appropriately. Noted net UF 1.5L during yesterday's HD. Blood pressures remain stable. Discussed with bedside RN. Informed blood sugar was also stable. Will check another CBC today. Also noted recent Mg of 1.4 which was repleted today. Next HD 5/31. Planning to discharge to SNF for rehab.  Objective Vitals:   12/07/22 1625 12/07/22 2123 12/08/22 0541 12/08/22 0812  BP: (!) 114/47 (!) 119/51 (!) 115/33 (!) 141/52  Pulse: 76 71 74 83  Resp: 18 17 18 18   Temp: 98.2 F (36.8 C) 98.1 F (36.7 C) 98.4 F (36.9 C) 98 F (36.7 C)  TempSrc: Oral Oral    SpO2: 100% 100% 100% 98%  Weight:      Height:       Physical Exam General: More sleepy than usual but still responding to questions appropriately, alert, on RA, NAD Heart: S1 and S2; No murmurs, gallops, or rubs Lungs:Clear bilaterally; No wheezing, rales, and rhonchi Abdomen: Soft and non-tender Extremities: No LE edema Dialysis Access: LIJ TDC; L AVG (thrombosed)   Filed Weights   12/06/22 1932 12/07/22 0715 12/07/22 1102  Weight: 60 kg 57.6 kg 56.4 kg    Intake/Output Summary (Last 24 hours) at 12/08/2022 1316 Last data filed at 12/08/2022 0900 Gross per 24 hour  Intake 540 ml  Output 0 ml  Net 540 ml    Additional Objective Labs: Basic Metabolic Panel: Recent Labs  Lab 12/05/22 0859 12/06/22 0235 12/07/22 0703  NA 132* 131* 132*  K 4.7 4.2 4.4  CL 96* 95* 95*  CO2 22 24 24   GLUCOSE 142* 101* 82  BUN 57* 28* 39*  CREATININE 7.89* 4.46* 6.49*  CALCIUM 8.3* 8.1* 8.7*  PHOS 4.8* 3.1 4.1   Liver Function Tests: Recent Labs  Lab 12/05/22 0859 12/06/22 0235 12/07/22 0703  ALBUMIN 2.3* 2.2* 2.3*   No results for input(s): "LIPASE", "AMYLASE" in the last 168 hours. CBC: Recent Labs  Lab  12/02/22 0800 12/03/22 0301 12/05/22 0900 12/06/22 0235  WBC 7.1 6.9 6.8 6.7  NEUTROABS  --   --   --  5.1  HGB 7.4* 7.5* 7.3* 7.7*  HCT 22.3* 23.0* 22.9* 23.5*  MCV 106.7* 105.5* 107.5* 109.3*  PLT 187 202 239 206   Blood Culture    Component Value Date/Time   SDES BLOOD LEFT HAND 12/04/2022 2013   SPECREQUEST  12/04/2022 2013    BOTTLES DRAWN AEROBIC AND ANAEROBIC Blood Culture adequate volume   CULT  12/04/2022 2013    NO GROWTH 4 DAYS Performed at [facility], [address] [place], [place] [address]    REPTSTATUS PENDING 12/04/2022 2013    Cardiac Enzymes: No results for input(s): "CKTOTAL", "CKMB", "CKMBINDEX", "TROPONINI" in the last 168 hours. CBG: Recent Labs  Lab 12/05/22 1528 12/06/22 1637 12/06/22 2043 12/08/22 1149  GLUCAP 134* 88 167* 92   Iron Studies: No results for input(s): "IRON", "TIBC", "TRANSFERRIN", "FERRITIN" in the last 72 hours. Lab Results  Component Value Date   INR 1.3 (H) 11/08/2021   INR 1.0 02/03/2021   INR 1.1 06/12/2020   Studies/Results: No results found.  Medications:  magnesium sulfate bolus IVPB 4 g (12/08/22 1139)    amiodarone  100 mg Oral Daily   atorvastatin  10 mg Oral QHS   carvedilol  6.25 mg Oral BID WC   Chlorhexidine Gluconate Cloth  6 each Topical Q0600   folic acid  1 mg Oral Daily   gabapentin  100 mg Oral QHS   hydrALAZINE  50 mg Oral Q8H   levETIRAcetam  500 mg Oral Daily   levothyroxine  75 mcg Oral QAC breakfast   pantoprazole  40 mg Oral Daily   sevelamer carbonate  3,200 mg Oral TID WC    Dialysis Orders: She receives her HD at [facility] MWF-requested today to have outpatient records faxed here today.  Assessment/Plan: 1.  Acute blood loss anemia secondary to rectal bleeding: Recurrent issue with EGD 5/23 and APC of jejunal arteriovenous malformations/ angiodysplasia by Dr. Adela Lank.  She has not had any additional hematochezia or melena overnight . Transfused 1 unit prbcs on 5/22.   Hemoglobin now 7.7. On high dose ESA at her outpatient clinic - got Mircera 200 on 5/20. She appears more sleepy than usual. Re-checking another CBC today. Transfuse for Hgb < 7. 2.  End-stage renal disease: Usually on a Monday/Wednesday/Friday schedule. Repleted with 4GM IV Mg for Mg of 1.4 today. Will re-check Mg in AM. Next HD 5/31. She is euvolemic on exam and Bps are stable. Given frailty, will use minimal UF at next HD. 3.  Hypokalemia: Likely secondary to limited intake and diarrhea prior to admission-improved with supplementation and 4K dialysate. Resolved. K+ now 4.4 4.  Secondary hyperparathyroidism: On sevelamer for phosphorus binding with acceptable calcium and phosphorus level. 5.  Hypertension: Blood pressures variable but acceptable.  Resumed on oral antihypertensive therapy and monitor with ultrafiltration on hemodialysis. 6.  Atrial fibrillation: She is currently rate controlled and appears to be in sinus rhythm, not a candidate for anticoagulation due to recurrent GI bleed. 7. Dispo: Plan to discharge to SNF for rehab  Salome Holmes, NP  [facility] 12/08/2022,1:16 PM  LOS: 8 days

## 2022-12-08 NOTE — Progress Notes (Signed)
Physical Therapy Treatment Patient Details Name: Shawna [name] MRN: [id number] DOB: [birth year] Today's Date: 12/08/2022   History of Present Illness 82 yo female admitted due to rectal bleed and x4 weeks of diarrhea.  PMH ESRD on HD MWF, seizure disorder, afib, HTN, HLD, CAD s/p CABG and PCI, hypothyroidism, , Small bowel AVMS, GIB, Hx of colon CA, TIA, PUD, Gout.    PT Comments    Patient resting in bed and lethargic keeping eyes closed at start. Pt required tactile cues to become more alert but easily drifting back to sleep.  Pt required increased assist for bed mobility today needing Mod Assist to move supine<>sit and then Min assist for sit<>stand. Pt with greater difficulty sequencing side steps along EOB to step towards head and posture flexed throughout. EOS pt returned to supine and bed placed in chair position with lunch set up. Pt required Max encouragement to engage in self feeding tasks and cues to ensure pt swallowing before taking more food. NTx2 outside room and alerted pt needs supervision to eat, NT's aware and agreeable to assist. Recommending increased assistance and follow up therapy at current level. Will progress as able.   Recommendations for follow up therapy are one component of a multi-disciplinary discharge planning process, led by the attending physician.  Recommendations may be updated based on patient status, additional functional criteria and insurance authorization.  Follow Up Recommendations  Can patient physically be transported by private vehicle: Yes    Assistance Recommended at Discharge Frequent or constant Supervision/Assistance  Patient can return home with the following A little help with walking and/or transfers;A little help with bathing/dressing/bathroom;Assistance with cooking/housework;Help with stairs or ramp for entrance;Assist for transportation;Direct supervision/assist for medications management   Equipment Recommendations  None recommended by  PT    Recommendations for Other Services       Precautions / Restrictions Precautions Precautions: Fall Precaution Comments: R femoral catheter 5/4 Restrictions Weight Bearing Restrictions: No     Mobility  Bed Mobility Overal bed mobility: Modified Independent Bed Mobility: Supine to Sit, Sit to Supine     Supine to sit: HOB elevated, Mod assist Sit to supine: Mod assist   General bed mobility comments: Mod assist to guide hand placement on bed rail and bring LE's off EOB. pt required assist to complete rising trunk up to EOB and intermittent cues while sitting to maintain alertness and upright balance. EOS pt requried Mod assist to return to supine and +2 to boost superiorly.    Transfers Overall transfer level: Needs assistance Equipment used: Rolling walker (2 wheels) Transfers: Sit to/from Stand Sit to Stand: Min assist           General transfer comment: Min assist to power up, pt using bil UE on RW to rise, feet anterior to BOS and assist needed to facilitate anterior lean at trunk.    Ambulation/Gait                   Stairs             Wheelchair Mobility    Modified Rankin (Stroke Patients Only)       Balance Overall balance assessment: Needs assistance Sitting-balance support: Feet supported, Bilateral upper extremity supported Sitting balance-Leahy Scale: Fair Sitting balance - Comments: R lateral lean at times seated EOB   Standing balance support: Bilateral upper extremity supported, During functional activity, Reliant on assistive device for balance Standing balance-Leahy Scale: Poor Standing balance comment: RW needed for balance with mobility  Cognition Arousal/Alertness: Lethargic Behavior During Therapy: WFL for tasks assessed/performed Overall Cognitive Status: History of cognitive impairments - at baseline                                 General Comments: pt  lethargic.groggy this session, more alert once sitting EOB but then falling asleep at EOB as well required constant verbal/tactile cues.        Exercises      General Comments        Pertinent Vitals/Pain Pain Assessment Pain Assessment: PAINAD Breathing: normal Negative Vocalization: none Facial Expression: smiling or inexpressive Body Language: relaxed Consolability: no need to console PAINAD Score: 0 Pain Intervention(s): Monitored during session, Repositioned, Limited activity within patient's tolerance    Home Living                          Prior Function            PT Goals (current goals can now be found in the care plan section) Acute Rehab PT Goals Patient Stated Goal: get better and back to family PT Goal Formulation: With patient Time For Goal Achievement: 12/16/22 Potential to Achieve Goals: Good Progress towards PT goals: Progressing toward goals (slow)    Frequency    Min 3X/week      PT Plan Current plan remains appropriate    Co-evaluation              AM-PAC PT "6 Clicks" Mobility   Outcome Measure  Help needed turning from your back to your side while in a flat bed without using bedrails?: A Little Help needed moving from lying on your back to sitting on the side of a flat bed without using bedrails?: A Little Help needed moving to and from a bed to a chair (including a wheelchair)?: A Little Help needed standing up from a chair using your arms (e.g., wheelchair or bedside chair)?: A Little Help needed to walk in hospital room?: A Little Help needed climbing 3-5 steps with a railing? : A Lot 6 Click Score: 17    End of Session Equipment Utilized During Treatment: Gait belt Activity Tolerance: Patient tolerated treatment well Patient left: in chair;with call bell/phone within reach;with chair alarm set Nurse Communication: Mobility status PT Visit Diagnosis: Other abnormalities of gait and mobility (R26.89);Muscle  weakness (generalized) (M62.81);Difficulty in walking, not elsewhere classified (R26.2)     Time: 1140-1201 PT Time Calculation (min) (ACUTE ONLY): 21 min  Charges:  $Therapeutic Activity: 8-22 mins                     Wynn Maudlin, DPT Acute Rehabilitation Services Office [phone]  12/08/22 12:16 PM

## 2022-12-08 NOTE — Progress Notes (Signed)
Progress Note   Patient: Shawna [name]:[id number] DOB: [birth year] DOA: 11/30/2022     8 DOS: the patient was seen and examined on 12/08/2022   Brief hospital course: ABIEL LINKO was admitted to the hospital with the working diagnosis of recurrent upper gastrointestinal bleeding.    82 y.o. female with past medical history of end-stage renal disease on hemodialysis Monday Wednesday Friday, seizure disorder, paroxysmal atrial fibrillation not on anticoagulation secondary to history of GI bleed, hypertension, hyperlipidemia, CAD status post CABG and PCI, hypothyroidism, small bowel AVM who was recently admitted for  recurrent GI bleeding with acute on chronic blood loss anemia, to the hospital with frequent loose bowel movements for last 3 days with some blood when wiping wiping. On her initial physical examination her blood pressure was 156/62, HR 62, RR 19 and 02 saturation 98%, lungs with no wheezing or rales, heart with S1 and S2 present irregularly irregular, abdomen with no distention or tenderness, no lower extremity edema.    Na 135, K 3,4 Cl 101, bicarbonate 14 glucose 118, bun 59 cr 7,84 AST 6 ALT 11  Wbc 1,98 hgb 7,1 plt 261  C diff antigen positive, C diff toxin negative, PCR negative.  GI panel infectious negative.    Chest radiograph with cardiomegaly, small bilateral pleural effusions, sternotomy wires in place, HD catheter left subclavian vein.    EKG 113 bpm, left axis deviation, left bundle branch block, prolonged qtc, atrial fibrillation rhythm with no significant ST segment or T wave changes.    Fecal occult test was positive.   1 unit of packed RBC was transfused.    GI consulted and patient underwent small bowel enteroscopy, finding numerous jejunal AVMs noted and treated with APC.  Assessment and Plan: AVM (arteriovenous malformation) of small bowel, acquired with hemorrhage Patient requires one unit PRBC transfusion with good toleration.   Small bowel enteroscopy  with jejunal diverticulum several. Six non bleeding angiodysplastic lesions in the proximal jejunum treated with argon plasma coagulation.    Plan to continue antiacid therapy and outpatient octreotide injections.  Follow up with Dr Levon Hedger as outpatient from GI.  Hgb has remained stable     End-stage renal disease on hemodialysis [facility]) Patient underwent renal replacement therapy during her hospitalization.  Her discharge BUN is 28 down from 57. She will continue renal replacement therapy with Monday, Wednesday and Friday schedule.    Essential hypertension Continue blood pressure control with carvedilol and hydralazine.   Lisinopril on hold for now.    PAF (paroxysmal atrial fibrillation) (HCC) Continue rate control with carvedilol, no anticoagulation or antiplatelet therapy due to recurrent GI bleeding. Rate controlled this AM   Hx of CABG No chest pain.  Continue blood pressure control.    Diabetes mellitus type 2 in nonobese (HCC) Glucose remained stable during her hospitalization. She was placed on insulin sliding scale.    Pressure injury of skin Pressure Injury 11/30/22 Rectum Stage 2 - Partial thickness loss of dermis presenting as a shallow open injury with a red, pink wound bed without slough. open shallow ulcer with pink wound bed    Hypothyroidism Continue with levothyroxine.       Subjective: Remains pleasantly confused this AM  Physical Exam: Vitals:   12/07/22 2123 12/08/22 0541 12/08/22 0812 12/08/22 1511  BP: (!) 119/51 (!) 115/33 (!) 141/52 (!) 115/44  Pulse: 71 74 83 64  Resp: 17 18 18 18   Temp: 98.1 F (36.7 C) 98.4 F (36.9 C) 98 F (  36.7 C) 98.1 F (36.7 C)  TempSrc: Oral   Oral  SpO2: 100% 100% 98% 100%  Weight:      Height:       General exam: Conversant, in no acute distress Respiratory system: normal chest rise, clear, no audible wheezing Cardiovascular system: regular rhythm, s1-s2 Gastrointestinal system: Nondistended, nontender,  pos BS Central nervous system: No seizures, no tremors Extremities: No cyanosis, no joint deformities Skin: No rashes, no pallor Psychiatry: difficult to assess given mentation  Data Reviewed:  Labs reviewed: WBC 7.3, Plts 214  Family Communication: Pt in room, family not at bedside  Disposition: Status is: Inpatient Remains inpatient appropriate because: Severity of illness  Planned Discharge Destination: Skilled nursing facility     Author: Rickey Barbara, MD 12/08/2022 3:32 PM  For on call review [email or web address].

## 2022-12-09 DIAGNOSIS — R197 Diarrhea, unspecified: Secondary | ICD-10-CM | POA: Diagnosis not present

## 2022-12-09 DIAGNOSIS — D649 Anemia, unspecified: Secondary | ICD-10-CM | POA: Diagnosis not present

## 2022-12-09 LAB — RENAL FUNCTION PANEL
Albumin: 2.3 g/dL — ABNORMAL LOW (ref 3.5–5.0)
Anion gap: 10 (ref 5–15)
BUN: 30 mg/dL — ABNORMAL HIGH (ref 8–23)
CO2: 27 mmol/L (ref 22–32)
Calcium: 8.6 mg/dL — ABNORMAL LOW (ref 8.9–10.3)
Chloride: 94 mmol/L — ABNORMAL LOW (ref 98–111)
Creatinine, Ser: 6.11 mg/dL — ABNORMAL HIGH (ref 0.44–1.00)
GFR, Estimated: 6 mL/min — ABNORMAL LOW (ref >60)
Glucose, Bld: 98 mg/dL (ref 70–99)
Phosphorus: 3.7 mg/dL (ref 2.5–4.6)
Potassium: 4.8 mmol/L (ref 3.5–5.1)
Sodium: 131 mmol/L — ABNORMAL LOW (ref 135–145)

## 2022-12-09 LAB — CBC WITH DIFFERENTIAL/PLATELET
Abs Immature Granulocytes: 0.02 10*3/uL (ref 0.00–0.07)
Basophils Absolute: 0.1 10*3/uL (ref 0.0–0.1)
Basophils Relative: 1 %
Eosinophils Absolute: 0.2 10*3/uL (ref 0.0–0.5)
Eosinophils Relative: 4 %
HCT: 22.8 % — ABNORMAL LOW (ref 36.0–46.0)
Hemoglobin: 7.3 g/dL — ABNORMAL LOW (ref 12.0–15.0)
Immature Granulocytes: 0 %
Lymphocytes Relative: 11 %
Lymphs Abs: 0.6 10*3/uL — ABNORMAL LOW (ref 0.7–4.0)
MCH: 35.1 pg — ABNORMAL HIGH (ref 26.0–34.0)
MCHC: 32 g/dL (ref 30.0–36.0)
MCV: 109.6 fL — ABNORMAL HIGH (ref 80.0–100.0)
Monocytes Absolute: 0.7 10*3/uL (ref 0.1–1.0)
Monocytes Relative: 12 %
Neutro Abs: 4.2 10*3/uL (ref 1.7–7.7)
Neutrophils Relative %: 72 %
Platelets: 229 10*3/uL (ref 150–400)
RBC: 2.08 MIL/uL — ABNORMAL LOW (ref 3.87–5.11)
RDW: 22.3 % — ABNORMAL HIGH (ref 11.5–15.5)
WBC: 5.9 10*3/uL (ref 4.0–10.5)
nRBC: 0 % (ref 0.0–0.2)

## 2022-12-09 LAB — CULTURE, BLOOD (ROUTINE X 2): Special Requests: ADEQUATE

## 2022-12-09 LAB — MAGNESIUM: Magnesium: 2.7 mg/dL — ABNORMAL HIGH (ref 1.7–2.4)

## 2022-12-09 MED ORDER — CALCITRIOL 0.5 MCG PO CAPS
1.7500 ug | ORAL_CAPSULE | ORAL | Status: DC
  Administered 2022-12-09 – 2022-12-12 (×2): 1.75 ug via ORAL
  Filled 2022-12-09 (×2): qty 1

## 2022-12-09 MED ORDER — CINACALCET HCL 30 MG PO TABS
30.0000 mg | ORAL_TABLET | ORAL | Status: DC
  Administered 2022-12-09 – 2022-12-12 (×2): 30 mg via ORAL
  Filled 2022-12-09 (×2): qty 1

## 2022-12-09 NOTE — Progress Notes (Signed)
Met patient in Hemodialysis.  She has complained of Chest Pain lasting about 15 min.  Per RN about the same time the patient's HR had increased to the 120s and she was given her amiodarone and coreg.  She also had some hypotension.  PA also at bedside: order received for 100cc bolus  BP 93/45   HR 120  RR 18  O2 sat 98% She is currently pain free.

## 2022-12-09 NOTE — TOC Progression Note (Signed)
Transition of Care [facility]) - Initial/Assessment Note    Patient Details  Name: Shawna [name] MRN: [id number] Date of Birth: 1941-01-29  Transition of Care [facility]) CM/SW Contact:    Ralene Bathe, LCSW Phone Number: 12/09/2022, 1:35 PM  Clinical Narrative:                 LCSW contacted Debbie in admissions at [facility] (SNF), secured bed offer, and confirmed that the facility can transport the patient to dialysis.   LCSW contacted the patient's spouse and provided update.  The spouse inquired as to whether [facility] (SNF) could accept and was informed that the facility is unable to accept the patient.  The spouse was presented with bed offers again and continues to prefer [facility].  Insurance authorization has been requested and determination is pending at this time.    TOC following.    Barriers to Discharge: Continued Medical Work up   Patient Goals and CMS Choice Patient states their goals for this hospitalization and ongoing recovery are:: Husband requests rehab then return home. CMS Medicare.gov Compare Post Acute Care list provided to:: Patient Choice offered to / list presented to : Patient, Spouse      Expected Discharge Plan and Services       Living arrangements for the past 2 months: Single Family Home Expected Discharge Date: 12/06/22                                    Prior Living Arrangements/Services Living arrangements for the past 2 months: Single Family Home Lives with:: Spouse Patient language and need for interpreter reviewed:: Yes Do you feel safe going back to the place where you live?: Yes      Need for Family Participation in Patient Care: Yes (Comment) Care giver support system in place?: Yes (comment) Current home services: DME (Cane, walker, rollator, shower seat, grab bars, wheelchair.) Criminal Activity/Legal Involvement Pertinent to Current Situation/Hospitalization: No - Comment as needed  Activities of Daily  Living Home Assistive Devices/Equipment: Cane (specify quad or straight) ADL Screening (condition at time of admission) Patient's cognitive ability adequate to safely complete daily activities?: Yes Is the patient deaf or have difficulty hearing?: No Does the patient have difficulty seeing, even when wearing glasses/contacts?: No Does the patient have difficulty concentrating, remembering, or making decisions?: No Patient able to express need for assistance with ADLs?: No Does the patient have difficulty dressing or bathing?: No Independently performs ADLs?: Yes (appropriate for developmental age) Does the patient have difficulty walking or climbing stairs?: No Weakness of Legs: None Weakness of Arms/Hands: None  Permission Sought/Granted Permission sought to share information with : Case Manager, Family Supports Permission granted to share information with : Yes, Verbal Permission Granted              Emotional Assessment Appearance:: Appears stated age Attitude/Demeanor/Rapport: Engaged, Gracious Affect (typically observed): Accepting, Appropriate, Calm, Hopeful, Pleasant Orientation: : Oriented to Self, Oriented to Place Alcohol / Substance Use: Not Applicable Psych Involvement: No (comment)  Admission diagnosis:  Rectal bleeding [K62.5] Symptomatic anemia [D64.9] Diarrhea, unspecified type [R19.7] Patient Active Problem List   Diagnosis Date Noted   Pressure injury of skin 12/01/2022   Rectal bleeding 11/30/2022   Generalized abdominal pain 11/10/2022   Ileus (HCC) 11/04/2022   Periumbilical abdominal pain 11/04/2022   Symptomatic anemia 10/29/2022   Anemia of chronic disease 10/29/2022   Upper  GI bleed 10/29/2022   Iron deficiency anemia due to chronic blood loss 08/26/2022   Benign neoplasm of transverse colon 08/11/2022   Benign neoplasm of descending colon 08/11/2022   Benign neoplasm of sigmoid colon 08/11/2022   Angiodysplasia of duodenum 08/11/2022    Delirium 04/22/2022   History of arteriovenous malformation (AVM) 04/22/2022   Acute metabolic encephalopathy 11/09/2021   NSTEMI, initial episode of care (HCC) 10/19/2021   Acute on chronic blood loss anemia 09/09/2021   Ischemic cardiomyopathy    Atrial fibrillation, chronic (HCC) 05/07/2021   Neuropathy 04/23/2021   Anemia associated with chronic renal failure 04/13/2021   Left knee pain 03/15/2021   Moderate protein-calorie malnutrition (HCC) 02/27/2021   Hypokalemia 02/27/2021   COVID-19 virus infection 02/03/2021   Leukocytosis 02/03/2021   Hypothyroidism 02/03/2021   Myositis 12/03/2020   Ataxia 12/02/2020   Diabetes mellitus type 2 in nonobese (HCC) 11/10/2020   Failure to thrive in adult 11/09/2020   Dialysis AV fistula malfunction, initial encounter (HCC)    Jugular vein occlusion, right (HCC)    Failure of surgically constructed arteriovenous fistula (HCC) 10/03/2020   Myoclonus 08/31/2020   Clotted renal dialysis AV graft, initial encounter (HCC)    Hemodialysis-associated hypotension    Irritable bowel syndrome 02/25/2020   Adenomatous duodenal polyp 09/10/2019   History of GI bleed 09/10/2019   Hand steal syndrome (HCC) 08/01/2017   CAD (coronary artery disease) 06/05/2017   Intestinal ischemia (HCC)    Complication of vascular access for dialysis 03/19/2017   Preoperative clearance 01/25/2017   H/O non-ST elevation myocardial infarction (NSTEMI) 10/24/2016   Non-ST elevation (NSTEMI) myocardial infarction [facility])    Heme positive stool    Cardiac arrest [facility])    Palliative care encounter    Goals of care, counseling/discussion    Flash pulmonary edema (HCC) 04/06/2016   History of colon cancer 01/27/2016   History of ovarian cancer 01/27/2016   PAF (paroxysmal atrial fibrillation) (HCC) 10/14/2015   Malignant neoplasm of right ovary (HCC) 10/14/2015   SVT (supraventricular tachycardia) 09/08/2015   Essential hypertension    Dyspnea    Constipation  03/12/2013   Abdominal wall pain in left flank 03/12/2013   Occlusion and stenosis of carotid artery without mention of cerebral infarction 01/24/2013   Hx of CABG 07/05/2012   Carotid artery disease (HCC) 07/05/2012   Mitral regurgitation 06/12/2012   Non-STEMI (non-ST elevated myocardial infarction) (HCC) 06/08/2012   Chronic combined systolic and diastolic CHF (congestive heart failure) (HCC) 05/02/2012   AVM (arteriovenous malformation) of small bowel, acquired with hemorrhage 01/20/2012   GERD (gastroesophageal reflux disease) 01/09/2012   HLD (hyperlipidemia) 01/05/2012   Atherosclerotic heart disease of native coronary artery without angina pectoris 12/16/2011   Anxiety disorder 05/04/2011   End-stage renal disease on hemodialysis (HCC) 04/29/2011   Gout 04/29/2011   PCP:  Practice, [facility]:   [facility] [address], [place] - [address] [place] [address] Phone: [phone] Fax: [phone]  [facility] (ESRD Bundle Only) - [place], [place] - [address] [place] [address] Phone: [phone] Fax: [phone]  [facility] - [address] [place], [place] - [address] [place] [address] Phone: [phone] Fax: [phone]  [facility] [address] [place] [address] Phone: [phone] Fax: [phone]     Social Determinants of Health (SDOH) Social History: SDOH Screenings   Food Insecurity: No Food Insecurity (10/29/2022)  Housing: Low Risk  (10/29/2022)  Transportation Needs: No Transportation Needs (10/29/2022)  Utilities: Not At Risk (10/29/2022)  Financial Resource Strain: Low Risk  (12/14/2018)  Physical Activity: Unknown (12/14/2018)  Social Connections: Unknown (12/14/2018)  Stress: No Stress Concern Present (12/14/2018)  Tobacco Use: Low Risk  (12/04/2022)   SDOH Interventions: Transportation Interventions: Intervention Not Indicated,  Inpatient TOC, Patient Resources (Friends/Family)   Readmission Risk Interventions    10/31/2022   12:45 PM 08/29/2022    4:47 PM 11/12/2021    8:56 AM  Readmission Risk Prevention Plan  Transportation Screening Complete Complete Complete  Medication Review [employer]) Complete Complete Complete  PCP or Specialist appointment within 3-5 days of discharge  Complete Complete  HRI or Home Care Consult Complete Complete Complete  SW Recovery Care/Counseling Consult Complete Complete Complete  Palliative Care Screening Not Applicable Not Applicable Not Applicable  Skilled Nursing Facility Not Applicable Complete Complete

## 2022-12-09 NOTE — Plan of Care (Signed)
  Problem: Health Behavior/Discharge Planning: Goal: Ability to manage health-related needs will improve Outcome: Progressing   Problem: Clinical Measurements: Goal: Ability to maintain clinical measurements within normal limits will improve Outcome: Progressing Goal: Will remain free from infection Outcome: Progressing Goal: Diagnostic test results will improve Outcome: Progressing Goal: Respiratory complications will improve Outcome: Progressing Goal: Cardiovascular complication will be avoided Outcome: Progressing   Problem: Activity: Goal: Risk for activity intolerance will decrease Outcome: Progressing   Problem: Nutrition: Goal: Adequate nutrition will be maintained Outcome: Progressing   Problem: Coping: Goal: Level of anxiety will decrease Outcome: Progressing   Problem: Elimination: Goal: Will not experience complications related to bowel motility Outcome: Progressing Goal: Will not experience complications related to urinary retention Outcome: Progressing   Problem: Pain Managment: Goal: General experience of comfort will improve Outcome: Progressing   Problem: Safety: Goal: Ability to remain free from injury will improve Outcome: Progressing   Problem: Skin Integrity: Goal: Risk for impaired skin integrity will decrease Outcome: Progressing   

## 2022-12-09 NOTE — Progress Notes (Signed)
Contacted by CSW regarding pt's possible d/c to snf this weekend if insurance auth for snf is received. Contacted [facility] and spoke to [place]. Clinic advised that pt may d/c to snf this weekend and resume care on Monday. Clinic agreeable to pt resuming on Monday should pt d/c this weekend. Navigator will faxed d/c summary and last renal note to clinic on Monday if pt d/c to snf this weekend.   Olivia Canter Renal Navigator [phone]

## 2022-12-09 NOTE — Progress Notes (Signed)
POST HD TX NOTE  12/09/22 1115  Vitals  Temp 97.7 F (36.5 C)  Temp Source Oral  BP (!) 98/48  MAP (mmHg) (!) 63  BP Location Right Arm  BP Method Automatic  Patient Position (if appropriate) Lying  Pulse Rate 75  Pulse Rate Source Monitor  ECG Heart Rate 75  Resp 18  Oxygen Therapy  SpO2 98 %  O2 Device Room Air  Pulse Oximetry Type Continuous  During Treatment Monitoring  Intra-Hemodialysis Comments (S)   (post HD tx VS check)  Post Treatment  Dialyzer Clearance Clear  Duration of HD Treatment -hour(s) 3.5 hour(s) (3hr 25 min)  Hemodialysis Intake (mL) 300 mL ( NS bolus during tx and extra NS bolus after rinse back)  Liters Processed 81.8  Fluid Removed (mL) -200 mL (+283ml, gained )  Tolerated HD Treatment No (Comment)  Post-Hemodialysis Comments (S)  tx ended 5 min early d/t another drop in bp into the 70's and c/;o head hurting. pt HR increased and sustained in the high 120's all the sudden during tx, called to get pt's ammiodorone and coreg, primary nurse brought them to me and i admin, w/in 5 min, pt's bp in the 70's and pt c/o cp. PA was called and made aware of the situation and instructed me to call RR. RR was called and PA came to bedside. pt then stated cp gone. PA instructed me to admin another NS bolus at that time and keep UF off the rest of the tx. UF goal not bed, pt was positive , blood rinsed back, VSS. Medication Admin: Ammiodorone 100mg  po, coreg 6.25mg  po, Heparin Dwells 3800 units  Hemodialysis Catheter Left Subclavian Double lumen Temporary (Non-Tunneled)  Placement Date: 06/09/22   Placed prior to admission: Yes  Orientation: Left  Access Location: Subclavian  Hemodialysis Catheter Type: Double lumen Temporary (Non-Tunneled)  Site Condition No complications  Blue Lumen Status Heparin locked;Dead end cap in place  Red Lumen Status Heparin locked;Dead end cap in place  Purple Lumen Status N/A  Catheter fill solution Heparin 1000  units/ml  Catheter fill volume (Arterial) 1.9 cc  Catheter fill volume (Venous) 1.9  Dressing Type Transparent  Dressing Status Antimicrobial disc in place;Clean, Dry, Intact  Drainage Description None  Post treatment catheter status Capped and Clamped

## 2022-12-09 NOTE — Progress Notes (Signed)
[facility] Progress Note   Subjective:   Patient seen on HD. Tolerating well so far. Denies SOB, CP, dizziness, nausea. Does not seem overly drowsy.   Objective Vitals:   12/08/22 2050 12/09/22 0445 12/09/22 0721 12/09/22 0800  BP: (!) 115/40 (!) 129/48 (!) 135/43 (!) 143/55  Pulse: 61 67 67 72  Resp: 18 17 (!) 21 16  Temp: 98.3 F (36.8 C) 98.6 F (37 C) 98.1 F (36.7 C)   TempSrc: Oral Oral Oral   SpO2: 97% 100% 95% 97%  Weight:      Height:       Physical Exam General: Alert female in NAD Heart: RRR, no murmurs, rubs or gallops Lungs: CTA bilaterally, respirations unlabored  Abdomen: Soft, non-distended, +BS Extremities: No edema b/l lower extremities Dialysis Access:  [facility] [place] [facility] accessed  Additional Objective Labs: Basic Metabolic Panel: Recent Labs  Lab 12/06/22 0235 12/07/22 0703 12/09/22 0206  NA 131* 132* 131*  K 4.2 4.4 4.8  CL 95* 95* 94*  CO2 24 24 27   GLUCOSE 101* 82 98  BUN 28* 39* 30*  CREATININE 4.46* 6.49* 6.11*  CALCIUM 8.1* 8.7* 8.6*  PHOS 3.1 4.1 3.7   Liver Function Tests: Recent Labs  Lab 12/06/22 0235 12/07/22 0703 12/09/22 0206  ALBUMIN 2.2* 2.3* 2.3*   No results for input(s): "LIPASE", "AMYLASE" in the last 168 hours. CBC: Recent Labs  Lab 12/03/22 0301 12/05/22 0900 12/06/22 0235 12/08/22 1340 12/09/22 0206  WBC 6.9 6.8 6.7 6.3 5.9  NEUTROABS  --   --  5.1 4.9 4.2  HGB 7.5* 7.3* 7.7* 7.3* 7.3*  HCT 23.0* 22.9* 23.5* 23.5* 22.8*  MCV 105.5* 107.5* 109.3* 109.8* 109.6*  PLT 202 239 206 214 229   Blood Culture    Component Value Date/Time   SDES BLOOD LEFT HAND 12/04/2022 2013   SPECREQUEST  12/04/2022 2013    BOTTLES DRAWN AEROBIC AND ANAEROBIC Blood Culture adequate volume   CULT  12/04/2022 2013    NO GROWTH 4 DAYS Performed at [facility], [address], [place], [place] [address]    REPTSTATUS PENDING 12/04/2022 2013    Cardiac Enzymes: No results for input(s): "CKTOTAL", "CKMB",  "CKMBINDEX", "TROPONINI" in the last 168 hours. CBG: Recent Labs  Lab 12/05/22 1528 12/06/22 1637 12/06/22 2043 12/08/22 1149  GLUCAP 134* 88 167* 92   Iron Studies: No results for input(s): "IRON", "TIBC", "TRANSFERRIN", "FERRITIN" in the last 72 hours. @lablastinr3 @ Studies/Results: No results found. Medications:   amiodarone  100 mg Oral Daily   atorvastatin  10 mg Oral QHS   carvedilol  6.25 mg Oral BID WC   Chlorhexidine Gluconate Cloth  6 each Topical Q0600   folic acid  1 mg Oral Daily   hydrALAZINE  50 mg Oral Q8H   levETIRAcetam  500 mg Oral Daily   levothyroxine  75 mcg Oral QAC breakfast   pantoprazole  40 mg Oral Daily   sevelamer carbonate  3,200 mg Oral TID WC    OP Dialysis Orders: [facility] EDW 58.5kg TDC 2K 2.5Ca BFR 300 DFR 500 Calcitrol 1.7mcg PO q HD Sensipar 30mg  PO q HD Mircera 200 mcg IV q 2 weeks- Last dose 11/28/22  Assessment/Plan: 1.  Acute blood loss anemia secondary to rectal bleeding: Recurrent issue with EGD 5/23 and APC of jejunal arteriovenous malformations/ angiodysplasia by Dr. Adela Lank.  She has not had any additional hematochezia or melena overnight . Transfused 1 unit prbcs on 5/22.  Hemoglobin now 7.3.  On high dose ESA at her outpatient clinic - got Mircera 200 on 5/20.  2.  End-stage renal disease: Usually on a Monday/Wednesday/Friday schedule. Repleted with 4GM IV Mg for Mg of 1.4 yesterday, repeat Mg 2.7.  3.  Hypokalemia: Likely secondary to limited intake and diarrhea prior to admission-improved with supplementation and 4K dialysate. Resolved. K+ now 4.8 4.  Secondary hyperparathyroidism: On sevelamer for phosphorus binding with acceptable calcium and phosphorus level. Will resume her calcitriol and sensipar TIW 5.  Hypertension: Blood pressures variable but acceptable.  Resumed on oral antihypertensive therapy and monitor with ultrafiltration on hemodialysis. Appears euvolemic today.  6.  Atrial fibrillation: She is currently  rate controlled and appears to be in sinus rhythm, not a candidate for anticoagulation due to recurrent GI bleed. 7. Dispo: Plan to discharge to SNF for rehab  Rogers Blocker, PA-C 12/09/2022, 8:09 AM  [facility] Pager: [phone]

## 2022-12-09 NOTE — Progress Notes (Signed)
Progress Note   Patient: Shawna [name] ZOX:[id number] DOB: 09-26-1940 DOA: 11/30/2022     9 DOS: the patient was seen and examined on 12/09/2022   Brief hospital course: Shawna [name] was admitted to the hospital with the working diagnosis of recurrent upper gastrointestinal bleeding.    82 y.o. female with past medical history of end-stage renal disease on hemodialysis Monday Wednesday Friday, seizure disorder, paroxysmal atrial fibrillation not on anticoagulation secondary to history of GI bleed, hypertension, hyperlipidemia, CAD status post CABG and PCI, hypothyroidism, small bowel AVM who was recently admitted for  recurrent GI bleeding with acute on chronic blood loss anemia, to the hospital with frequent loose bowel movements for last 3 days with some blood when wiping wiping. On her initial physical examination her blood pressure was 156/62, HR 62, RR 19 and 02 saturation 98%, lungs with no wheezing or rales, heart with S1 and S2 present irregularly irregular, abdomen with no distention or tenderness, no lower extremity edema.    Na 135, K 3,4 Cl 101, bicarbonate 14 glucose 118, bun 59 cr 7,84 AST 6 ALT 11  Wbc 1,98 hgb 7,1 plt 261  C diff antigen positive, C diff toxin negative, PCR negative.  GI panel infectious negative.    Chest radiograph with cardiomegaly, small bilateral pleural effusions, sternotomy wires in place, HD catheter left subclavian vein.    EKG 113 bpm, left axis deviation, left bundle branch block, prolonged qtc, atrial fibrillation rhythm with no significant ST segment or T wave changes.    Fecal occult test was positive.   1 unit of packed RBC was transfused.    GI consulted and patient underwent small bowel enteroscopy, finding numerous jejunal AVMs noted and treated with APC.  Assessment and Plan: AVM (arteriovenous malformation) of small bowel, acquired with hemorrhage Patient requires one unit PRBC transfusion with good toleration.   Small bowel enteroscopy  with jejunal diverticulum several. Six non bleeding angiodysplastic lesions in the proximal jejunum treated with argon plasma coagulation.    Plan to continue antiacid therapy and outpatient octreotide injections.  Follow up with Dr Shawna [name] as outpatient from GI.  Hgb has remained stable     End-stage renal disease on hemodialysis [facility]) Patient underwent renal replacement therapy during her hospitalization.  She will continue renal replacement therapy with Monday, Wednesday and Friday schedule.  -Cont volume management with HD   Essential hypertension Continue blood pressure control with carvedilol and hydralazine.   Lisinopril remains on hold for now.    PAF (paroxysmal atrial fibrillation) (HCC) Continue rate control with carvedilol, no anticoagulation or antiplatelet therapy due to recurrent GI bleeding. Rate controlled this AM   Hx of CABG No chest pain.  Continue blood pressure control.    Diabetes mellitus type 2 in nonobese (HCC) Glucose remained stable during her hospitalization. She was placed on insulin sliding scale.    Pressure injury of skin, Rectum Stage 2 present on admit Pressure Injury 11/30/22 Rectum Stage 2 - Partial thickness loss of dermis presenting as a shallow open injury with a red, pink wound bed without slough. open shallow ulcer with pink wound bed    Hypothyroidism Continue with levothyroxine.       Subjective: Pleasantly confused this AM. Eagerly hoping to go home  Physical Exam: Vitals:   12/09/22 1107 12/09/22 1115 12/09/22 1223 12/09/22 1618  BP: (!) 103/52 (!) 98/48 (!) 130/49 (!) 127/41  Pulse: (!) 117 75 74 71  Resp: 20 18 18 16   Temp:  97.7 F (36.5 C) 98.2 F (36.8 C) 98.1 F (36.7 C)  TempSrc:  Oral  Oral  SpO2: 99% 98% 98% 98%  Weight:  58.9 kg    Height:       General exam: Awake, laying in bed, in nad Respiratory system: Normal respiratory effort, no wheezing Cardiovascular system: regular rate, s1,  s2 Gastrointestinal system: Soft, nondistended, positive BS Central nervous system: CN2-12 grossly intact, strength intact Extremities: Perfused, no clubbing Skin: Normal skin turgor, no notable skin lesions seen Psychiatry: Mood normal // no visual hallucinations   Data Reviewed:  Labs reviewed: WBC 7.3, Plts 229  Family Communication: Pt in room, family not at bedside  Disposition: Status is: Inpatient Remains inpatient appropriate because: Severity of illness  Planned Discharge Destination: Skilled nursing facility     Author: Rickey Barbara, MD 12/09/2022 4:27 PM  For on call review [email or web address].

## 2022-12-10 DIAGNOSIS — D649 Anemia, unspecified: Secondary | ICD-10-CM | POA: Diagnosis not present

## 2022-12-10 DIAGNOSIS — R197 Diarrhea, unspecified: Secondary | ICD-10-CM | POA: Diagnosis not present

## 2022-12-10 MED ORDER — ONDANSETRON HCL 4 MG PO TABS
4.0000 mg | ORAL_TABLET | Freq: Three times a day (TID) | ORAL | Status: DC | PRN
  Administered 2022-12-10 (×2): 4 mg via ORAL
  Filled 2022-12-10 (×3): qty 1

## 2022-12-10 NOTE — Progress Notes (Signed)
Progress Note   Patient: Shawna [name] UEA:[id number] DOB: 1941-03-30 DOA: 11/30/2022     10 DOS: the patient was seen and examined on 12/10/2022   Brief hospital course: LISETTE VANDENBROEK was admitted to the hospital with the working diagnosis of recurrent upper gastrointestinal bleeding.    82 y.o. female with past medical history of end-stage renal disease on hemodialysis Monday Wednesday Friday, seizure disorder, paroxysmal atrial fibrillation not on anticoagulation secondary to history of GI bleed, hypertension, hyperlipidemia, CAD status post CABG and PCI, hypothyroidism, small bowel AVM who was recently admitted for  recurrent GI bleeding with acute on chronic blood loss anemia, to the hospital with frequent loose bowel movements for last 3 days with some blood when wiping wiping. On her initial physical examination her blood pressure was 156/62, HR 62, RR 19 and 02 saturation 98%, lungs with no wheezing or rales, heart with S1 and S2 present irregularly irregular, abdomen with no distention or tenderness, no lower extremity edema.    Na 135, K 3,4 Cl 101, bicarbonate 14 glucose 118, bun 59 cr 7,84 AST 6 ALT 11  Wbc 1,98 hgb 7,1 plt 261  C diff antigen positive, C diff toxin negative, PCR negative.  GI panel infectious negative.    Chest radiograph with cardiomegaly, small bilateral pleural effusions, sternotomy wires in place, HD catheter left subclavian vein.    EKG 113 bpm, left axis deviation, left bundle branch block, prolonged qtc, atrial fibrillation rhythm with no significant ST segment or T wave changes.    Fecal occult test was positive.   1 unit of packed RBC was transfused.    GI consulted and patient underwent small bowel enteroscopy, finding numerous jejunal AVMs noted and treated with APC.  Assessment and Plan: AVM (arteriovenous malformation) of small bowel, acquired with hemorrhage Patient requires one unit PRBC transfusion with good toleration.   Small bowel enteroscopy  with jejunal diverticulum several. Six non bleeding angiodysplastic lesions in the proximal jejunum treated with argon plasma coagulation.    Plan to continue antiacid therapy and outpatient octreotide injections.  Follow up with Dr Levon Hedger as outpatient from GI.  Hgb remains stable     End-stage renal disease on hemodialysis [facility]) Patient underwent renal replacement therapy during her hospitalization.  She will continue renal replacement therapy with Monday, Wednesday and Friday schedule.  -Cont volume management with HD   Essential hypertension Continued on carvedilol and hydralazine.   Lisinopril remains on hold for now.  Noted to be hypotensive during HD sessions. Agree with holding off on coreg prior to HD as tolerated   PAF (paroxysmal atrial fibrillation) (HCC) Continue rate control with carvedilol and amiodarone, no anticoagulation or antiplatelet therapy due to recurrent GI bleeding. Rate controlled this AM   Hx of CABG No chest pain.  Continue blood pressure control.    Diabetes mellitus type 2 in nonobese (HCC) Glucose remained stable during her hospitalization. She was placed on insulin sliding scale.    Pressure injury of skin, Rectum Stage 2 present on admit Pressure Injury 11/30/22 Rectum Stage 2 - Partial thickness loss of dermis presenting as a shallow open injury with a red, pink wound bed without slough. open shallow ulcer with pink wound bed    Hypothyroidism Continue with levothyroxine.       Subjective: When seen, complaining of wanting to have BM  Physical Exam: Vitals:   12/10/22 0320 12/10/22 0851 12/10/22 1428 12/10/22 1632  BP: (!) 140/51 (!) 132/58 (!) 134/43 (!) 165/71  Pulse: 69 71  66  Resp: 19 18  18   Temp: 99 F (37.2 C) 98 F (36.7 C)  98.1 F (36.7 C)  TempSrc: Oral Oral  Oral  SpO2: 100% 100%  100%  Weight:      Height:       General exam: Conversant, in no acute distress Respiratory system: normal chest rise, clear, no  audible wheezing Cardiovascular system: regular rhythm, s1-s2 Gastrointestinal system: Nondistended, nontender, pos BS Central nervous system: No seizures, no tremors Extremities: No cyanosis, no joint deformities Skin: No rashes, no pallor Psychiatry: Affect normal // no auditory hallucinations   Data Reviewed:  There are no new results to review at this time.  Family Communication: Pt in room, family not at bedside  Disposition: Status is: Inpatient Remains inpatient appropriate because: Severity of illness  Planned Discharge Destination: Skilled nursing facility     Author: Rickey Barbara, MD 12/10/2022 5:29 PM  For on call review [email or web address].

## 2022-12-10 NOTE — Progress Notes (Signed)
Shawna [name] Progress Note   Subjective:   Seen in room, tired after working with therapy but no other complaints. Denies SOB, CP, palpitations, dizziness, nausea.   Had episode of a.fib on HD yesterday, given her regular dose of amiodarone and carvedilol and BP dropped, pt began having chest pain. Given 200cc NS. Chest pain resolved once BP improved.   Objective Vitals:   12/09/22 1618 12/09/22 2049 12/10/22 0320 12/10/22 0851  BP: (!) 127/41 (!) 161/54 (!) 140/51 (!) 132/58  Pulse: 71 76 69 71  Resp: 16 18 19 18   Temp: 98.1 F (36.7 C) 98.4 F (36.9 C) 99 F (37.2 C) 98 F (36.7 C)  TempSrc: Oral Oral Oral Oral  SpO2: 98% 100% 100% 100%  Weight:      Height:       Physical Exam General: Alert female in NAD Heart: RRR, no murmurs, rubs or gallops Lungs: CTA bilaterally, respirations unlabored  Abdomen: Soft, non-distended, +BS Extremities: No edema b/l lower extremities Dialysis Access:  [facility]    Additional Objective Labs: Basic Metabolic Panel: Recent Labs  Lab 12/06/22 0235 12/07/22 0703 12/09/22 0206  NA 131* 132* 131*  K 4.2 4.4 4.8  CL 95* 95* 94*  CO2 24 24 27   GLUCOSE 101* 82 98  BUN 28* 39* 30*  CREATININE 4.46* 6.49* 6.11*  CALCIUM 8.1* 8.7* 8.6*  PHOS 3.1 4.1 3.7   Liver Function Tests: Recent Labs  Lab 12/06/22 0235 12/07/22 0703 12/09/22 0206  ALBUMIN 2.2* 2.3* 2.3*   No results for input(s): "LIPASE", "AMYLASE" in the last 168 hours. CBC: Recent Labs  Lab 12/05/22 0900 12/06/22 0235 12/08/22 1340 12/09/22 0206  WBC 6.8 6.7 6.3 5.9  NEUTROABS  --  5.1 4.9 4.2  HGB 7.3* 7.7* 7.3* 7.3*  HCT 22.9* 23.5* 23.5* 22.8*  MCV 107.5* 109.3* 109.8* 109.6*  PLT 239 206 214 229   Blood Culture    Component Value Date/Time   SDES BLOOD LEFT HAND 12/04/2022 2013   SPECREQUEST  12/04/2022 2013    BOTTLES DRAWN AEROBIC AND ANAEROBIC Blood Culture adequate volume   CULT  12/04/2022 2013    NO GROWTH 5 DAYS Performed at [facility], [address] [place], [place] [address]    REPTSTATUS 12/09/2022 FINAL 12/04/2022 2013    Cardiac Enzymes: No results for input(s): "CKTOTAL", "CKMB", "CKMBINDEX", "TROPONINI" in the last 168 hours. CBG: Recent Labs  Lab 12/05/22 1528 12/06/22 1637 12/06/22 2043 12/08/22 1149  GLUCAP 134* 88 167* 92   Iron Studies: No results for input(s): "IRON", "TIBC", "TRANSFERRIN", "FERRITIN" in the last 72 hours. @lablastinr3 @ Studies/Results: No results found. Medications:   amiodarone  100 mg Oral Daily   atorvastatin  10 mg Oral QHS   calcitRIOL  1.75 mcg Oral Q M,W,F-1800   carvedilol  6.25 mg Oral BID WC   Chlorhexidine Gluconate Cloth  6 each Topical Q0600   cinacalcet  30 mg Oral Q M,W,F-1800   folic acid  1 mg Oral Daily   hydrALAZINE  50 mg Oral Q8H   levETIRAcetam  500 mg Oral Daily   levothyroxine  75 mcg Oral QAC breakfast   pantoprazole  40 mg Oral Daily   sevelamer carbonate  3,200 mg Oral TID WC    OP Dialysis Orders: [facility] EDW 58.5kg TDC 2K 2.5Ca BFR 300 DFR 500 Calcitrol 1.43mcg PO q HD Sensipar 30mg  PO q HD Mircera 200 mcg IV q 2 weeks- Last dose 11/28/22  Assessment/Plan: 1.  Acute blood loss anemia secondary to rectal bleeding: Recurrent issue with EGD 5/23 and APC of jejunal arteriovenous malformations/ angiodysplasia by Dr. Adela Lank.  She has not had any additional hematochezia or melena overnight . Transfused 1 unit prbcs on 5/22.  Hemoglobin now 7.3. On high dose ESA at her outpatient clinic - got Mircera 200 on 5/20.  2.  End-stage renal disease: Usually on a Monday/Wednesday/Friday schedule. Repleted with 4GM IV Mg for Mg of 1.4 yesterday, repeat Mg 2.7.  3.  Hypokalemia: Likely secondary to limited intake and diarrhea prior to admission-improved with supplementation and 4K dialysate. Resolved. K+ now 4.8 4.  Secondary hyperparathyroidism: On sevelamer for phosphorus binding with acceptable calcium and phosphorus level. Will resume  her calcitriol and sensipar TIW 5.  Hypertension: Blood pressures variable but acceptable.  Resumed on oral antihypertensive therapy. Net + fluid status on HD yesterday but appears euvolemic 6.  Atrial fibrillation: Pt had an episode of a.fib, hypotension and chest discomfort on HD yesterday which resolved after receiving her amiodarone and coreg. Would not hold amiodarone prior to HD, but she does not typically take her coreg pre-HD due to hypotension. Not a candidate for anticoagulation due to recurrent GI bleed. 7. Dispo: Plan to discharge to SNF for rehab   Rogers Blocker, PA-C 12/10/2022, 10:47 AM  [name] Pager: [phone]

## 2022-12-10 NOTE — Progress Notes (Signed)
Occupational Therapy Treatment Patient Details Name: Shawna [name] MRN: [id number] DOB: 02/09/1941 Today's Date: 12/10/2022   History of present illness 82 yo female admitted due to rectal bleed and x4 weeks of diarrhea.  PMH ESRD on HD MWF, seizure disorder, afib, HTN, HLD, CAD s/p CABG and PCI, hypothyroidism, , Small bowel AVMS, GIB, Hx of colon CA, TIA, PUD, Gout.   OT comments  Patient progressing s evidenced by an increased score on 6-Clicks AM-PAC measure of occupational Performance with previous score of 13/24, and current score of 16/24.and showed improved cognition and alertness for ADLs, following instructions well, smiling and singing when ambulating in room, compared to previous session when pt was very lethargic and unable to safely mobilize to bathroom or sink with concerns for a change in status. Patient remains limited by generalized weakness and decreased activity tolerance along with deficits noted below. Pt continues to demonstrate good rehab potential, OT goals remain appropriate, and would benefit from continued skilled OT to increase safety and independence with ADLs and functional transfers to allow pt to return home safely and reduce caregiver burden and fall risk.    Recommendations for follow up therapy are one component of a multi-disciplinary discharge planning process, led by the attending physician.  Recommendations may be updated based on patient status, additional functional criteria and insurance authorization.    Assistance Recommended at Discharge Frequent or constant Supervision/Assistance  Patient can return home with the following  Two people to help with walking and/or transfers;Two people to help with bathing/dressing/bathroom;Assistance with cooking/housework;Direct supervision/assist for medications management;Direct supervision/assist for financial management;Assist for transportation;Help with stairs or ramp for entrance   Equipment Recommendations  None  recommended by OT    Recommendations for Other Services      Precautions / Restrictions Precautions Precautions: Fall Precaution Comments: R femoral catheter 5/4 Restrictions Weight Bearing Restrictions: No       Mobility Bed Mobility               General bed mobility comments: Pt received in recliner.    Transfers Overall transfer level: Needs assistance   Transfers: Sit to/from Stand Sit to Stand: Min guard                 Balance Overall balance assessment: Needs assistance Sitting-balance support: Feet supported, Bilateral upper extremity supported Sitting balance-Leahy Scale: Good Sitting balance - Comments: In recliner     Standing balance-Leahy Scale: Poor (Progressing to fair with static standing at sink.)                             ADL either performed or assessed with clinical judgement   ADL Overall ADL's : Needs assistance/impaired Eating/Feeding: Independent;Sitting   Grooming: Wash/dry hands;Oral care;Supervision/safety;Min guard;Cueing for safety Grooming Details (indicate cue type and reason): Pt stood from her recliner with Min guard assist to power up. Pt used RW to ambulate to sink and cued on safe positoning of RW anterior to sink which pt demonstrated back correctly. Pt stood at sink for multiple grooming tasks with supervision. Pt leaning forearms on sink as needed for support.                 Toilet Transfer: Min guard;Rolling walker (2 wheels);Ambulation;Comfort height toilet     Toileting - Clothing Manipulation Details (indicate cue type and reason): Pt transferred but did not void.            Extremity/Trunk Assessment  Vision Baseline Vision/History: 1 Wears glasses (Wearing bifocals) Ability to See in Adequate Light: 0 Adequate Vision Assessment?: No apparent visual deficits   Perception     Praxis      Cognition Arousal/Alertness: Awake/alert Behavior During Therapy: WFL  for tasks assessed/performed Overall Cognitive Status: History of cognitive impairments - at baseline                                 General Comments: Pt very alert and smiling, singing at times while working. Follwoing instructions well.        Exercises      Shoulder Instructions       General Comments      Pertinent Vitals/ Pain       Pain Assessment Pain Assessment: No/denies pain  Home Living                                          Prior Functioning/Environment              Frequency  Min 2X/week        Progress Toward Goals  OT Goals(current goals can now be found in the care plan section)  Progress towards OT goals: Progressing toward goals  Acute Rehab OT Goals Patient Stated Goal: "Let's go!" OT Goal Formulation: With patient Time For Goal Achievement: 12/17/22 Potential to Achieve Goals: Good  Plan Discharge plan remains appropriate;Other (comment) (Reassessment not performed as pt very alert, appropropriate and tolerting standing ADLs well.)    Co-evaluation                 AM-PAC OT "6 Clicks" Daily Activity     Outcome Measure   Help from another person eating meals?: None Help from another person taking care of personal grooming?: A Little Help from another person toileting, which includes using toliet, bedpan, or urinal?: A Lot Help from another person bathing (including washing, rinsing, drying)?: A Lot Help from another person to put on and taking off regular upper body clothing?: A Little Help from another person to put on and taking off regular lower body clothing?: A Lot 6 Click Score: 16    End of Session Equipment Utilized During Treatment: Gait belt;Rolling walker (2 wheels)      Activity Tolerance Patient tolerated treatment well   Patient Left in chair;with call bell/phone within reach;with chair alarm set   Nurse Communication Other (comment) (CNA requests door stay open.  Informed that chair alarm now engaged)        Time: [phone] OT Time Calculation (min): 20 min  Charges: OT General Charges $OT Visit: 1 Visit OT Treatments $Self Care/Home Management : 8-22 mins  Victorino Dike, OT Acute Rehab Services Office: [phone] 12/10/2022  Theodoro Clock 12/10/2022, 9:52 AM

## 2022-12-11 DIAGNOSIS — D649 Anemia, unspecified: Secondary | ICD-10-CM | POA: Diagnosis not present

## 2022-12-11 DIAGNOSIS — R197 Diarrhea, unspecified: Secondary | ICD-10-CM | POA: Diagnosis not present

## 2022-12-11 LAB — COMPREHENSIVE METABOLIC PANEL
ALT: 9 U/L (ref 0–44)
AST: 18 U/L (ref 15–41)
Albumin: 2.3 g/dL — ABNORMAL LOW (ref 3.5–5.0)
Alkaline Phosphatase: 60 U/L (ref 38–126)
Anion gap: 14 (ref 5–15)
BUN: 28 mg/dL — ABNORMAL HIGH (ref 8–23)
CO2: 23 mmol/L (ref 22–32)
Calcium: 8.3 mg/dL — ABNORMAL LOW (ref 8.9–10.3)
Chloride: 93 mmol/L — ABNORMAL LOW (ref 98–111)
Creatinine, Ser: 5.28 mg/dL — ABNORMAL HIGH (ref 0.44–1.00)
GFR, Estimated: 8 mL/min — ABNORMAL LOW (ref >60)
Glucose, Bld: 88 mg/dL (ref 70–99)
Potassium: 4.7 mmol/L (ref 3.5–5.1)
Sodium: 130 mmol/L — ABNORMAL LOW (ref 135–145)
Total Bilirubin: 0.8 mg/dL (ref 0.3–1.2)
Total Protein: 4.7 g/dL — ABNORMAL LOW (ref 6.5–8.1)

## 2022-12-11 MED ORDER — CHLORHEXIDINE GLUCONATE CLOTH 2 % EX PADS
6.0000 | MEDICATED_PAD | Freq: Every day | CUTANEOUS | Status: DC
  Administered 2022-12-11 – 2022-12-13 (×3): 6 via TOPICAL

## 2022-12-11 NOTE — Progress Notes (Signed)
Progress Note   Patient: Shawna [name] ZOX:[id number] DOB: 1940/10/11 DOA: 11/30/2022     11 DOS: the patient was seen and examined on 12/11/2022   Brief hospital course: Shawna [name] was admitted to the hospital with the working diagnosis of recurrent upper gastrointestinal bleeding.    82 y.o. female with past medical history of end-stage renal disease on hemodialysis Monday Wednesday Friday, seizure disorder, paroxysmal atrial fibrillation not on anticoagulation secondary to history of GI bleed, hypertension, hyperlipidemia, CAD status post CABG and PCI, hypothyroidism, small bowel AVM who was recently admitted for  recurrent GI bleeding with acute on chronic blood loss anemia, to the hospital with frequent loose bowel movements for last 3 days with some blood when wiping wiping. On her initial physical examination her blood pressure was 156/62, HR 62, RR 19 and 02 saturation 98%, lungs with no wheezing or rales, heart with S1 and S2 present irregularly irregular, abdomen with no distention or tenderness, no lower extremity edema.    Na 135, K 3,4 Cl 101, bicarbonate 14 glucose 118, bun 59 cr 7,84 AST 6 ALT 11  Wbc 1,98 hgb 7,1 plt 261  C diff antigen positive, C diff toxin negative, PCR negative.  GI panel infectious negative.    Chest radiograph with cardiomegaly, small bilateral pleural effusions, sternotomy wires in place, HD catheter left subclavian vein.    EKG 113 bpm, left axis deviation, left bundle branch block, prolonged qtc, atrial fibrillation rhythm with no significant ST segment or T wave changes.    Fecal occult test was positive.   1 unit of packed RBC was transfused.    GI consulted and patient underwent small bowel enteroscopy, finding numerous jejunal AVMs noted and treated with APC.  Assessment and Plan: AVM (arteriovenous malformation) of small bowel, acquired with hemorrhage Patient requires one unit PRBC transfusion with good toleration.   Small bowel enteroscopy  with jejunal diverticulum several. Six non bleeding angiodysplastic lesions in the proximal jejunum treated with argon plasma coagulation.    Plan to continue antiacid therapy and outpatient octreotide injections.  Follow up with Shawna [name] as outpatient from GI.  Hgb continues to remain stable     End-stage renal disease on hemodialysis [facility]) Patient underwent renal replacement therapy during her hospitalization.  She will continue renal replacement therapy with Monday, Wednesday and Friday schedule.  -Cont volume management with HD   Essential hypertension Continued on carvedilol and hydralazine.   Lisinopril remains on hold for now.  Noted to be hypotensive during HD sessions. Agree with holding off on coreg prior to HD as tolerated   PAF (paroxysmal atrial fibrillation) (HCC) Continue rate control with carvedilol and amiodarone, no anticoagulation or antiplatelet therapy due to recurrent GI bleeding. Remains rate controlled this AM   Hx of CABG No chest pain.  Continue blood pressure control.    Diabetes mellitus type 2 in nonobese (HCC) Glucose remained stable during her hospitalization. She was placed on insulin sliding scale.    Pressure injury of skin, Rectum Stage 2 present on admit Pressure Injury 11/30/22 Rectum Stage 2 - Partial thickness loss of dermis presenting as a shallow open injury with a red, pink wound bed without slough. open shallow ulcer with pink wound bed    Hypothyroidism Continue with levothyroxine.       Subjective: Very pleasant, eating lunch when seen. Pt reportedly had solid stools this AM  Physical Exam: Vitals:   12/10/22 1936 12/11/22 0550 12/11/22 0905 12/11/22 1420  BP: Marland Kitchen)  132/41 (!) 104/49 (!) 116/55 (!) 154/62  Pulse: 67 (!) 53 66   Resp: 16 15 16    Temp: 98 F (36.7 C) 98.2 F (36.8 C) 98 F (36.7 C)   TempSrc:   Oral   SpO2: 99% 100% 100%   Weight:      Height:       General exam: Awake, laying in bed, in  nad Respiratory system: Normal respiratory effort, no wheezing Cardiovascular system: regular rate, s1, s2 Gastrointestinal system: Soft, nondistended, positive BS Central nervous system: CN2-12 grossly intact, strength intact Extremities: Perfused, no clubbing Skin: Normal skin turgor, no notable skin lesions seen Psychiatry: Mood normal // no visual hallucinations   Data Reviewed:  Labs reviewed: na 130, K 4.7, Cr 5.28  Family Communication: Pt in room, family not at bedside  Disposition: Status is: Inpatient Remains inpatient appropriate because: Severity of illness  Planned Discharge Destination: Skilled nursing facility     Author: Rickey Barbara, MD 12/11/2022 4:18 PM  For on call review [email or web address].

## 2022-12-11 NOTE — Progress Notes (Signed)
[facility] Progress Note   Subjective:   Pt seen in room, no concerns today. Denies SOB, CP, dizziness, nausea.   Objective Vitals:   12/10/22 1632 12/10/22 1936 12/11/22 0550 12/11/22 0905  BP: (!) 165/71 (!) 132/41 (!) 104/49 (!) 116/55  Pulse: 66 67 (!) 53 66  Resp: 18 16 15 16   Temp: 98.1 F (36.7 C) 98 F (36.7 C) 98.2 F (36.8 C) 98 F (36.7 C)  TempSrc: Oral   Oral  SpO2: 100% 99% 100% 100%  Weight:      Height:       Physical Exam General: Elderly female in NAD Heart: RRR, no murmurs, rubs or gallops Lungs: CTA bilaterally Abdomen: Non-distended, +BS Extremities: No edema b/l lower extremities Dialysis Access: [facility]  Additional Objective Labs: Basic Metabolic Panel: Recent Labs  Lab 12/06/22 0235 12/07/22 0703 12/09/22 0206 12/11/22 0231  NA 131* 132* 131* 130*  K 4.2 4.4 4.8 4.7  CL 95* 95* 94* 93*  CO2 24 24 27 23   GLUCOSE 101* 82 98 88  BUN 28* 39* 30* 28*  CREATININE 4.46* 6.49* 6.11* 5.28*  CALCIUM 8.1* 8.7* 8.6* 8.3*  PHOS 3.1 4.1 3.7  --    Liver Function Tests: Recent Labs  Lab 12/07/22 0703 12/09/22 0206 12/11/22 0231  AST  --   --  18  ALT  --   --  9  ALKPHOS  --   --  60  BILITOT  --   --  0.8  PROT  --   --  4.7*  ALBUMIN 2.3* 2.3* 2.3*   No results for input(s): "LIPASE", "AMYLASE" in the last 168 hours. CBC: Recent Labs  Lab 12/05/22 0900 12/06/22 0235 12/08/22 1340 12/09/22 0206  WBC 6.8 6.7 6.3 5.9  NEUTROABS  --  5.1 4.9 4.2  HGB 7.3* 7.7* 7.3* 7.3*  HCT 22.9* 23.5* 23.5* 22.8*  MCV 107.5* 109.3* 109.8* 109.6*  PLT 239 206 214 229   Blood Culture    Component Value Date/Time   SDES BLOOD LEFT HAND 12/04/2022 2013   SPECREQUEST  12/04/2022 2013    BOTTLES DRAWN AEROBIC AND ANAEROBIC Blood Culture adequate volume   CULT  12/04/2022 2013    NO GROWTH 5 DAYS Performed at [facility], [address], [place], [place] [address]    REPTSTATUS 12/09/2022 FINAL 12/04/2022 2013    Cardiac  Enzymes: No results for input(s): "CKTOTAL", "CKMB", "CKMBINDEX", "TROPONINI" in the last 168 hours. CBG: Recent Labs  Lab 12/05/22 1528 12/06/22 1637 12/06/22 2043 12/08/22 1149  GLUCAP 134* 88 167* 92   Iron Studies: No results for input(s): "IRON", "TIBC", "TRANSFERRIN", "FERRITIN" in the last 72 hours. @lablastinr3 @ Studies/Results: No results found. Medications:   amiodarone  100 mg Oral Daily   atorvastatin  10 mg Oral QHS   calcitRIOL  1.75 mcg Oral Q M,W,F-1800   carvedilol  6.25 mg Oral BID WC   Chlorhexidine Gluconate Cloth  6 each Topical Q0600   cinacalcet  30 mg Oral Q M,W,F-1800   folic acid  1 mg Oral Daily   hydrALAZINE  50 mg Oral Q8H   levETIRAcetam  500 mg Oral Daily   levothyroxine  75 mcg Oral QAC breakfast   pantoprazole  40 mg Oral Daily   sevelamer carbonate  3,200 mg Oral TID WC    OP Dialysis Orders: [facility] EDW 58.5kg TDC 2K 2.5Ca BFR 300 DFR 500 Calcitrol 1.20mcg PO q HD Sensipar 30mg  PO q HD Mircera  200 mcg IV q 2 weeks- Last dose 11/28/22  Assessment/Plan: 1.  Acute blood loss anemia secondary to rectal bleeding: Recurrent issue with EGD 5/23 and APC of jejunal arteriovenous malformations/ angiodysplasia by Dr. Adela Lank.  She has not had any additional hematochezia or melena overnight . Transfused 1 unit prbcs on 5/22.  Hemoglobin now 7.3. On high dose ESA at her outpatient clinic - got Mircera 200 on 5/20.  2.  End-stage renal disease: Usually on a Monday/Wednesday/Friday schedule. Repleted with 4GM IV Mg for Mg of 1.4 yesterday, repeat Mg 2.7.  3.  Hypokalemia: Likely secondary to limited intake and diarrhea prior to admission-improved with supplementation and 4K dialysate. Resolved.  4.  Secondary hyperparathyroidism: On sevelamer for phosphorus binding with acceptable calcium and phosphorus level. Will resume her calcitriol and sensipar TIW 5.  Hypertension: Blood pressures variable but acceptable.  Resumed on oral antihypertensive  therapy. Net + fluid status last HD due to hypotension after receiving carvedilol, but appears euvolemic 6.  Atrial fibrillation: Pt had an episode of a.fib, hypotension and chest discomfort on HD yesterday which resolved after receiving her amiodarone and coreg. Would not hold amiodarone prior to HD, but she does not typically take her coreg pre-HD due to hypotension. Not a candidate for anticoagulation due to recurrent GI bleed. 7. Dispo: Plan to discharge to SNF for rehab     Rogers Blocker, PA-C 12/11/2022, 10:30 AM  [facility] Pager: [phone]

## 2022-12-12 ENCOUNTER — Encounter (HOSPITAL_COMMUNITY): Payer: Self-pay | Admitting: Internal Medicine

## 2022-12-12 DIAGNOSIS — R197 Diarrhea, unspecified: Secondary | ICD-10-CM | POA: Diagnosis not present

## 2022-12-12 DIAGNOSIS — D649 Anemia, unspecified: Secondary | ICD-10-CM | POA: Diagnosis not present

## 2022-12-12 LAB — COMPREHENSIVE METABOLIC PANEL
ALT: 10 U/L (ref 0–44)
AST: 13 U/L — ABNORMAL LOW (ref 15–41)
Albumin: 2.4 g/dL — ABNORMAL LOW (ref 3.5–5.0)
Alkaline Phosphatase: 77 U/L (ref 38–126)
Anion gap: 12 (ref 5–15)
BUN: 40 mg/dL — ABNORMAL HIGH (ref 8–23)
CO2: 27 mmol/L (ref 22–32)
Calcium: 8.7 mg/dL — ABNORMAL LOW (ref 8.9–10.3)
Chloride: 92 mmol/L — ABNORMAL LOW (ref 98–111)
Creatinine, Ser: 7.59 mg/dL — ABNORMAL HIGH (ref 0.44–1.00)
GFR, Estimated: 5 mL/min — ABNORMAL LOW (ref >60)
Glucose, Bld: 106 mg/dL — ABNORMAL HIGH (ref 70–99)
Potassium: 4.6 mmol/L (ref 3.5–5.1)
Sodium: 131 mmol/L — ABNORMAL LOW (ref 135–145)
Total Bilirubin: 0.6 mg/dL (ref 0.3–1.2)
Total Protein: 5 g/dL — ABNORMAL LOW (ref 6.5–8.1)

## 2022-12-12 LAB — CBC
HCT: 22.2 % — ABNORMAL LOW (ref 36.0–46.0)
Hemoglobin: 7.1 g/dL — ABNORMAL LOW (ref 12.0–15.0)
MCH: 35.9 pg — ABNORMAL HIGH (ref 26.0–34.0)
MCHC: 32 g/dL (ref 30.0–36.0)
MCV: 112.1 fL — ABNORMAL HIGH (ref 80.0–100.0)
Platelets: 234 10*3/uL (ref 150–400)
RBC: 1.98 MIL/uL — ABNORMAL LOW (ref 3.87–5.11)
RDW: 21.9 % — ABNORMAL HIGH (ref 11.5–15.5)
WBC: 5.5 10*3/uL (ref 4.0–10.5)
nRBC: 0 % (ref 0.0–0.2)

## 2022-12-12 MED ORDER — ALUM & MAG HYDROXIDE-SIMETH 200-200-20 MG/5ML PO SUSP
30.0000 mL | ORAL | Status: DC | PRN
  Administered 2022-12-12: 30 mL via ORAL
  Filled 2022-12-12: qty 30

## 2022-12-12 MED ORDER — HEPARIN SODIUM (PORCINE) 1000 UNIT/ML IJ SOLN
3800.0000 [IU] | Freq: Once | INTRAMUSCULAR | Status: DC
  Filled 2022-12-12: qty 4

## 2022-12-12 NOTE — Progress Notes (Addendum)
Physical Therapy Treatment Patient Details Name: Shawna [name] MRN: [id number] DOB: [birth year] Today's Date: 12/12/2022   History of Present Illness 82 yo female admitted due to rectal bleed and x4 weeks of diarrhea.  PMH ESRD on HD MWF, seizure disorder, afib, HTN, HLD, CAD s/p CABG and PCI, hypothyroidism, , Small bowel AVMS, GIB, Hx of colon CA, TIA, PUD, Gout.   PT Comments    Pt received supine with HOB slightly elevated, slightly lethargic due to minimal rest from previous night.  Pt able to tolerate therapy session with a main focus on gait training. No physical assistance provided with bed mobility; but verbal cue provided for scooting and foot placement. Pt able to tolerate walking ~80 feet before requiring seated rest break. Verbal cues provided for proximity to RW, turning and upright stance throughout gait training. Ended session with patient reclined in hospital chair. Will continue to benefit from skilled physical therapy in order to facilitate functional mobility and discharge.     Recommendations for follow up therapy are one component of a multi-disciplinary discharge planning process, led by the attending physician.  Recommendations may be updated based on patient status, additional functional criteria and insurance authorization.  Follow Up Recommendations  Can patient physically be transported by private vehicle: Yes    Assistance Recommended at Discharge Frequent or constant Supervision/Assistance  Patient can return home with the following A little help with walking and/or transfers;A little help with bathing/dressing/bathroom;Assistance with cooking/housework;Help with stairs or ramp for entrance;Assist for transportation;Direct supervision/assist for medications management   Equipment Recommendations  None recommended by PT    Recommendations for Other Services       Precautions / Restrictions Precautions Precautions: Fall Restrictions Weight Bearing Restrictions:  No     Mobility  Bed Mobility Overal bed mobility: Needs Assistance Bed Mobility: Supine to Sit     Supine to sit: Min guard, HOB elevated     General bed mobility comments: Increased time with HOB elevated. No physical assist with transfer to EOB. Verbal cue to scoot to the edge.    Transfers Overall transfer level: Needs assistance Equipment used: Rolling walker (2 wheels) Transfers: Sit to/from Stand Sit to Stand: Min guard           General transfer comment: Pt able to transfer from sit to stand with verbal cue on hand placement. Able to power up and remain standing without physical assistance.    Ambulation/Gait Ambulation/Gait assistance: Min guard Gait Distance (Feet): 80 Feet Assistive device: Rolling walker (2 wheels) Gait Pattern/deviations: Step-through pattern, Decreased stride length, Trunk flexed Gait velocity: decreased Gait velocity interpretation: <1.31 ft/sec, indicative of household ambulator Pre-gait activities: Marching in standing at EOB, demonstrated good foot clearance. General Gait Details: Cued for proximity to RW and upright stance.   Stairs             Wheelchair Mobility    Modified Rankin (Stroke Patients Only)       Balance Overall balance assessment: Needs assistance Sitting-balance support: Feet supported Sitting balance-Leahy Scale: Good     Standing balance support: Bilateral upper extremity supported, Reliant on assistive device for balance Standing balance-Leahy Scale: Poor                              Cognition Arousal/Alertness: Awake/alert Behavior During Therapy: WFL for tasks assessed/performed Overall Cognitive Status: History of cognitive impairments - at baseline  General Comments: Pt alert and able to follow commands. Sang throughout therapy.        Exercises      General Comments        Pertinent Vitals/Pain Pain Assessment Pain  Assessment: No/denies pain    Home Living                          Prior Function            PT Goals (current goals can now be found in the care plan section) Acute Rehab PT Goals Patient Stated Goal: get better and back to family PT Goal Formulation: With patient Time For Goal Achievement: 12/16/22 Potential to Achieve Goals: Good Progress towards PT goals: Progressing toward goals    Frequency    Min 3X/week      PT Plan Current plan remains appropriate    Co-evaluation              AM-PAC PT "6 Clicks" Mobility   Outcome Measure  Help needed turning from your back to your side while in a flat bed without using bedrails?: A Little Help needed moving from lying on your back to sitting on the side of a flat bed without using bedrails?: A Little Help needed moving to and from a bed to a chair (including a wheelchair)?: A Little Help needed standing up from a chair using your arms (e.g., wheelchair or bedside chair)?: A Little Help needed to walk in hospital room?: A Little Help needed climbing 3-5 steps with a railing? : A Lot 6 Click Score: 17    End of Session Equipment Utilized During Treatment: Gait belt Activity Tolerance: Patient tolerated treatment well Patient left: in chair;with call bell/phone within reach;with chair alarm set Nurse Communication: Mobility status PT Visit Diagnosis: Other abnormalities of gait and mobility (R26.89);Muscle weakness (generalized) (M62.81);Difficulty in walking, not elsewhere classified (R26.2)     Time: 2956-2130 PT Time Calculation (min) (ACUTE ONLY): 22 min  Charges:  $Gait Training: 8-22 mins                     Christene Lye, SPT Acute Rehabilitation Services [phone] Secure chat preferred    Christene Lye 12/12/2022, 1:28 PM

## 2022-12-12 NOTE — Telephone Encounter (Signed)
Patient still inpatient.

## 2022-12-12 NOTE — Progress Notes (Signed)
Received patient in bed to unit.  Alert and oriented.  Informed consent signed and in chart.   TX duration:3.5  Patient tolerated well.  Transported back to the room  Alert, without acute distress.  Hand-off given to patient's nurse.   Access used: left [facility] Access issues: none  Total UF removed: 2L Medication(s) given: compazine   12/12/22 1717  Vitals  Temp Source Oral  BP (!) 145/50  MAP (mmHg) 78  BP Location Right Arm  BP Method Automatic  Patient Position (if appropriate) Lying  Pulse Rate 76  Pulse Rate Source Monitor  ECG Heart Rate 75  Resp 20  Oxygen Therapy  SpO2 100 %  O2 Device Room Air  During Treatment Monitoring  HD Safety Checks Performed Yes  Intra-Hemodialysis Comments Tx completed  Dialysis Fluid Bolus Normal Saline  Bolus Amount (mL) 300 mL  Fistula / Graft Left Upper arm Arteriovenous fistula  Placement Date: 03/19/19   Orientation: Left  Access Location: Upper arm  Access Type: Arteriovenous fistula  Site Condition  (old not working)  Fistula / Graft Assessment Absent;Thrill;Bruit    Marrell Dicaprio [facility]

## 2022-12-12 NOTE — TOC Progression Note (Signed)
Transition of Care [facility]) - Initial/Assessment Note    Patient Details  Name: Shawna [name] MRN: [id number] Date of Birth: 1941/03/13  Transition of Care [facility]) CM/SW Contact:    Ralene Bathe, LCSW Phone Number: 12/12/2022, 8:58 AM  Clinical Narrative:                 LCSW contacted the insurance company to receive an update on authorization for SNF as the determination is still pending.  LCSW [name] informed that the request [name] "under clinical review".  No time frame [name] given for when a decision would be received.    TOC following.     Barriers to Discharge: Continued Medical Work up   Patient Goals and CMS Choice Patient states their goals for this hospitalization and ongoing recovery are:: Husband requests rehab then return home. CMS Medicare.gov Compare Post Acute Care list provided to:: Patient Choice offered to / list presented to : Patient, Spouse      Expected Discharge Plan and Services       Living arrangements for the past 2 months: Single Family Home Expected Discharge Date: 12/06/22                                    Prior Living Arrangements/Services Living arrangements for the past 2 months: Single Family Home Lives with:: Spouse Patient language and need for interpreter reviewed:: Yes Do you feel safe going back to the place where you live?: Yes      Need for Family Participation in Patient Care: Yes (Comment) Care giver support system in place?: Yes (comment) Current home services: DME (Cane, walker, rollator, shower seat, grab bars, wheelchair.) Criminal Activity/Legal Involvement Pertinent to Current Situation/Hospitalization: No - Comment as needed  Activities of Daily Living Home Assistive Devices/Equipment: Cane (specify quad or straight) ADL Screening (condition at time of admission) Patient's cognitive ability adequate to safely complete daily activities?: Yes Is the patient deaf or have difficulty hearing?: No Does the patient have  difficulty seeing, even when wearing glasses/contacts?: No Does the patient have difficulty concentrating, remembering, or making decisions?: No Patient able to express need for assistance with ADLs?: No Does the patient have difficulty dressing or bathing?: No Independently performs ADLs?: Yes (appropriate for developmental age) Does the patient have difficulty walking or climbing stairs?: No Weakness of Legs: None Weakness of Arms/Hands: None  Permission Sought/Granted Permission sought to share information with : Case Manager, Family Supports Permission granted to share information with : Yes, Verbal Permission Granted              Emotional Assessment Appearance:: Appears stated age Attitude/Demeanor/Rapport: Engaged, Gracious Affect (typically observed): Accepting, Appropriate, Calm, Hopeful, Pleasant Orientation: : Oriented to Self, Oriented to Place Alcohol / Substance Use: Not Applicable Psych Involvement: No (comment)  Admission diagnosis:  Rectal bleeding [K62.5] Symptomatic anemia [D64.9] Diarrhea, unspecified type [R19.7] Patient Active Problem List   Diagnosis Date Noted   Pressure injury of skin 12/01/2022   Rectal bleeding 11/30/2022   Generalized abdominal pain 11/10/2022   Ileus (HCC) 11/04/2022   Periumbilical abdominal pain 11/04/2022   Symptomatic anemia 10/29/2022   Anemia of chronic disease 10/29/2022   Upper GI bleed 10/29/2022   Iron deficiency anemia due to chronic blood loss 08/26/2022   Benign neoplasm of transverse colon 08/11/2022   Benign neoplasm of descending colon 08/11/2022   Benign neoplasm of sigmoid colon 08/11/2022   Angiodysplasia  of duodenum 08/11/2022   Delirium 04/22/2022   History of arteriovenous malformation (AVM) 04/22/2022   Acute metabolic encephalopathy 11/09/2021   NSTEMI, initial episode of care (HCC) 10/19/2021   Acute on chronic blood loss anemia 09/09/2021   Ischemic cardiomyopathy    Atrial fibrillation, chronic  (HCC) 05/07/2021   Neuropathy 04/23/2021   Anemia associated with chronic renal failure 04/13/2021   Left knee pain 03/15/2021   Moderate protein-calorie malnutrition (HCC) 02/27/2021   Hypokalemia 02/27/2021   COVID-19 virus infection 02/03/2021   Leukocytosis 02/03/2021   Hypothyroidism 02/03/2021   Myositis 12/03/2020   Ataxia 12/02/2020   Diabetes mellitus type 2 in nonobese (HCC) 11/10/2020   Failure to thrive in adult 11/09/2020   Dialysis AV fistula malfunction, initial encounter (HCC)    Jugular vein occlusion, right (HCC)    Failure of surgically constructed arteriovenous fistula (HCC) 10/03/2020   Myoclonus 08/31/2020   Clotted renal dialysis AV graft, initial encounter (HCC)    Hemodialysis-associated hypotension    Irritable bowel syndrome 02/25/2020   Adenomatous duodenal polyp 09/10/2019   History of GI bleed 09/10/2019   Hand steal syndrome (HCC) 08/01/2017   CAD (coronary artery disease) 06/05/2017   Intestinal ischemia (HCC)    Complication of vascular access for dialysis 03/19/2017   Preoperative clearance 01/25/2017   H/O non-ST elevation myocardial infarction (NSTEMI) 10/24/2016   Non-ST elevation (NSTEMI) myocardial infarction [facility])    Heme positive stool    Cardiac arrest [facility])    Palliative care encounter    Goals of care, counseling/discussion    Flash pulmonary edema (HCC) 04/06/2016   History of colon cancer 01/27/2016   History of ovarian cancer 01/27/2016   PAF (paroxysmal atrial fibrillation) (HCC) 10/14/2015   Malignant neoplasm of right ovary (HCC) 10/14/2015   SVT (supraventricular tachycardia) 09/08/2015   Essential hypertension    Dyspnea    Constipation 03/12/2013   Abdominal wall pain in left flank 03/12/2013   Occlusion and stenosis of carotid artery without mention of cerebral infarction 01/24/2013   Hx of CABG 07/05/2012   Carotid artery disease (HCC) 07/05/2012   Mitral regurgitation 06/12/2012   Non-STEMI (non-ST elevated  myocardial infarction) (HCC) 06/08/2012   Chronic combined systolic and diastolic CHF (congestive heart failure) (HCC) 05/02/2012   AVM (arteriovenous malformation) of small bowel, acquired with hemorrhage 01/20/2012   GERD (gastroesophageal reflux disease) 01/09/2012   HLD (hyperlipidemia) 01/05/2012   Atherosclerotic heart disease of native coronary artery without angina pectoris 12/16/2011   Anxiety disorder 05/04/2011   End-stage renal disease on hemodialysis (HCC) 04/29/2011   Gout 04/29/2011   PCP:  Practice, [facility]:   [facility] [address] [place] - [address] [place] [address] Phone: [phone] Fax: [phone]  [facility] (ESRD Bundle Only) - [place], [place] - [address] [place] [address] Phone: [phone] Fax: [phone]  [facility] - [address] [place], [place] - [address] [place] [address] Phone: [phone] Fax: [phone]  [facility] [address] [place] [address] Phone: [phone] Fax: [phone]     Social Determinants of Health (SDOH) Social History: SDOH Screenings   Food Insecurity: No Food Insecurity (12/12/2022)  Housing: Low Risk  (12/12/2022)  Transportation Needs: No Transportation Needs (12/12/2022)  Utilities: Not At Risk (12/12/2022)  Financial Resource Strain: Low Risk  (12/14/2018)  Physical Activity: Unknown (12/14/2018)  Social Connections: Unknown (12/14/2018)  Stress: No Stress  Concern Present (12/14/2018)  Tobacco Use: Low Risk  (12/12/2022)   SDOH Interventions: Transportation Interventions: Intervention Not Indicated, Inpatient TOC, Patient Resources (Friends/Family)   Readmission Risk Interventions    10/31/2022   12:45 PM 08/29/2022    4:47 PM 11/12/2021    8:56 AM  Readmission Risk Prevention Plan  Transportation Screening Complete Complete Complete  Medication Review [employer]) Complete  Complete Complete  PCP or Specialist appointment within 3-5 days of discharge  Complete Complete  HRI or Home Care Consult Complete Complete Complete  SW Recovery Care/Counseling Consult Complete Complete Complete  Palliative Care Screening Not Applicable Not Applicable Not Applicable  Skilled Nursing Facility Not Applicable Complete Complete

## 2022-12-12 NOTE — Progress Notes (Signed)
Progress Note   Patient: Shawna [name]:[id number] DOB: Dec 14, 1940 DOA: 11/30/2022     12 DOS: the patient was seen and examined on 12/12/2022   Brief hospital course: SANDEE PORTES was admitted to the hospital with the working diagnosis of recurrent upper gastrointestinal bleeding.    82 y.o. female with past medical history of end-stage renal disease on hemodialysis Monday Wednesday Friday, seizure disorder, paroxysmal atrial fibrillation not on anticoagulation secondary to history of GI bleed, hypertension, hyperlipidemia, CAD status post CABG and PCI, hypothyroidism, small bowel AVM who was recently admitted for  recurrent GI bleeding with acute on chronic blood loss anemia, to the hospital with frequent loose bowel movements for last 3 days with some blood when wiping wiping. On her initial physical examination her blood pressure was 156/62, HR 62, RR 19 and 02 saturation 98%, lungs with no wheezing or rales, heart with S1 and S2 present irregularly irregular, abdomen with no distention or tenderness, no lower extremity edema.    Na 135, K 3,4 Cl 101, bicarbonate 14 glucose 118, bun 59 cr 7,84 AST 6 ALT 11  Wbc 1,98 hgb 7,1 plt 261  C diff antigen positive, C diff toxin negative, PCR negative.  GI panel infectious negative.    Chest radiograph with cardiomegaly, small bilateral pleural effusions, sternotomy wires in place, HD catheter left subclavian vein.    EKG 113 bpm, left axis deviation, left bundle branch block, prolonged qtc, atrial fibrillation rhythm with no significant ST segment or T wave changes.    Fecal occult test was positive.   1 unit of packed RBC was transfused.    GI consulted and patient underwent small bowel enteroscopy, finding numerous jejunal AVMs noted and treated with APC.  Assessment and Plan: AVM (arteriovenous malformation) of small bowel, acquired with hemorrhage Patient requires one unit PRBC transfusion with good toleration.   Small bowel enteroscopy  with jejunal diverticulum several. Six non bleeding angiodysplastic lesions in the proximal jejunum treated with argon plasma coagulation.    Plan to continue antiacid therapy and outpatient octreotide injections.  Follow up with Dr Levon Hedger as outpatient from GI.  Hgb continues to remain stable     End-stage renal disease on hemodialysis [facility]) Patient underwent renal replacement therapy during her hospitalization.  She will continue renal replacement therapy with Monday, Wednesday and Friday schedule.  -Cont volume management with HD   Essential hypertension Continued on carvedilol and hydralazine.   Lisinopril remains on hold for now.  Noted to be hypotensive during HD sessions. Agree with holding off on coreg prior to HD as tolerated -BP stable this AM   PAF (paroxysmal atrial fibrillation) (HCC) Continue rate control with carvedilol and amiodarone, no anticoagulation or antiplatelet therapy due to recurrent GI bleeding. Remains rate controlled this AM   Hx of CABG No chest pain.  Continue blood pressure control.    Diabetes mellitus type 2 in nonobese (HCC) Glucose remained stable during her hospitalization. She was placed on insulin sliding scale.    Pressure injury of skin, Rectum Stage 2 present on admit Pressure Injury 11/30/22 Rectum Stage 2 - Partial thickness loss of dermis presenting as a shallow open injury with a red, pink wound bed without slough. open shallow ulcer with pink wound bed    Hypothyroidism Continue with levothyroxine.       Subjective: Pt eating lunch. Very pleasant. Baseline confused  Physical Exam: Vitals:   12/12/22 1400 12/12/22 1430 12/12/22 1500 12/12/22 1530  BP: (!) 165/63 Marland Kitchen)  162/57 (!) 155/57 (!) 160/58  Pulse: 69 72 74 76  Resp: (!) 24 13 15  (!) 22  Temp:      TempSrc:      SpO2: 100% 98% 98% 99%  Weight:      Height:       General exam: Conversant, in no acute distress Respiratory system: normal chest rise, clear, no  audible wheezing Cardiovascular system: regular rhythm, s1-s2 Gastrointestinal system: Nondistended, nontender, pos BS Central nervous system: No seizures, no tremors Extremities: No cyanosis, no joint deformities Skin: No rashes, no pallor Psychiatry: Affect normal // no auditory hallucinations   Data Reviewed:  Labs reviewed: Na 131, K 4.6  Family Communication: Pt in room, family not at bedside  Disposition: Status is: Inpatient Remains inpatient appropriate because: Severity of illness  Planned Discharge Destination: Skilled nursing facility     Author: Rickey Barbara, MD 12/12/2022 3:38 PM  For on call review [email or web address].

## 2022-12-12 NOTE — Progress Notes (Signed)
[facility] Progress Note   Subjective: Seen in room. Denies rectal bleeding over night. HD today on schedule.     Objective Vitals:   12/11/22 1627 12/11/22 2057 12/12/22 0616 12/12/22 0843  BP: (!) 160/61 (!) 139/49 (!) 149/56 (!) 154/56  Pulse: 65  63 71  Resp: 18 16 18 18   Temp: 98.5 F (36.9 C) 98.4 F (36.9 C) 98.2 F (36.8 C) 98.3 F (36.8 C)  TempSrc: Oral Oral Oral Oral  SpO2: 99% 99% 96% 99%  Weight:      Height:       Physical Exam General: Pleasant elderly female, alert, NAD Heart: S1,S2 RRR SR BBB Lungs: CTAB Abdomen: NABS, NT Extremities: No LE edema Dialysis Access:  [facility] drsg intact    Additional Objective Labs: Basic Metabolic Panel: Recent Labs  Lab 12/06/22 0235 12/07/22 0703 12/09/22 0206 12/11/22 0231  NA 131* 132* 131* 130*  K 4.2 4.4 4.8 4.7  CL 95* 95* 94* 93*  CO2 24 24 27 23   GLUCOSE 101* 82 98 88  BUN 28* 39* 30* 28*  CREATININE 4.46* 6.49* 6.11* 5.28*  CALCIUM 8.1* 8.7* 8.6* 8.3*  PHOS 3.1 4.1 3.7  --    Liver Function Tests: Recent Labs  Lab 12/07/22 0703 12/09/22 0206 12/11/22 0231  AST  --   --  18  ALT  --   --  9  ALKPHOS  --   --  60  BILITOT  --   --  0.8  PROT  --   --  4.7*  ALBUMIN 2.3* 2.3* 2.3*   No results for input(s): "LIPASE", "AMYLASE" in the last 168 hours. CBC: Recent Labs  Lab 12/06/22 0235 12/08/22 1340 12/09/22 0206  WBC 6.7 6.3 5.9  NEUTROABS 5.1 4.9 4.2  HGB 7.7* 7.3* 7.3*  HCT 23.5* 23.5* 22.8*  MCV 109.3* 109.8* 109.6*  PLT 206 214 229   Blood Culture    Component Value Date/Time   SDES BLOOD LEFT HAND 12/04/2022 2013   SPECREQUEST  12/04/2022 2013    BOTTLES DRAWN AEROBIC AND ANAEROBIC Blood Culture adequate volume   CULT  12/04/2022 2013    NO GROWTH 5 DAYS Performed at [facility], [address], [place], [place] [address]    REPTSTATUS 12/09/2022 FINAL 12/04/2022 2013    Cardiac Enzymes: No results for input(s): "CKTOTAL", "CKMB", "CKMBINDEX",  "TROPONINI" in the last 168 hours. CBG: Recent Labs  Lab 12/05/22 1528 12/06/22 1637 12/06/22 2043 12/08/22 1149  GLUCAP 134* 88 167* 92   Iron Studies: No results for input(s): "IRON", "TIBC", "TRANSFERRIN", "FERRITIN" in the last 72 hours. @lablastinr3 @ Studies/Results: No results found. Medications:   amiodarone  100 mg Oral Daily   atorvastatin  10 mg Oral QHS   calcitRIOL  1.75 mcg Oral Q M,W,F-1800   carvedilol  6.25 mg Oral BID WC   Chlorhexidine Gluconate Cloth  6 each Topical Q0600   Chlorhexidine Gluconate Cloth  6 each Topical Q0600   cinacalcet  30 mg Oral Q M,W,F-1800   folic acid  1 mg Oral Daily   hydrALAZINE  50 mg Oral Q8H   levETIRAcetam  500 mg Oral Daily   levothyroxine  75 mcg Oral QAC breakfast   pantoprazole  40 mg Oral Daily   sevelamer carbonate  3,200 mg Oral TID WC     OP Dialysis Orders: [name] EDW 58.5kg TDC 2K 2.5Ca BFR 300 DFR 500 Calcitrol 1.42mcg PO q HD Sensipar 30mg  PO q  HD Mircera 200 mcg IV q 2 weeks- Last dose 11/28/22   Assessment/Plan: 1.  Acute blood loss anemia secondary to rectal bleeding: Recurrent issue with EGD 5/23 and APC of jejunal arteriovenous malformations/ angiodysplasia by Dr. Adela Lank.  She has not had any additional hematochezia or melena overnight . Transfused 1 unit prbcs on 5/22.  Hemoglobin now 7.3. On high dose ESA at her outpatient clinic - got Mircera 200 on 5/20.  2.  End-stage renal disease: Usually on a Monday/Wednesday/Friday schedule. HD 12/12/2022 3.  Hypokalemia: Likely secondary to limited intake and diarrhea prior to admission-improved with supplementation and 4K dialysate. Resolved.  4.  Secondary hyperparathyroidism: On sevelamer for phosphorus binding with acceptable calcium and phosphorus level. Will resume her calcitriol and Sensipar TIW 5.  Hypertension: Blood pressures variable but acceptable.  Resumed on oral antihypertensive therapy. Net + fluid status last HD due to hypotension  after receiving carvedilol, but appears euvolemic 6.  Atrial fibrillation: Pt had an episode of a.fib, hypotension and chest discomfort on HD yesterday which resolved after receiving her amiodarone and coreg. Would not hold amiodarone prior to HD, but she does not typically take her coreg pre-HD due to hypotension. Not a candidate for anticoagulation due to recurrent GI bleed. 7. Hypomagnesium: Repleted with 4GM IV Mg for Mg of 1.4, repeat Mg 2.7 12/09/22 7. Dispo: Plan to discharge to SNF for rehab     Desean Heemstra H. Herchel Hopkin NP-C 12/12/2022, 9:25 AM  [employer] [phone]

## 2022-12-13 ENCOUNTER — Inpatient Hospital Stay (HOSPITAL_COMMUNITY): Payer: Medicare Other

## 2022-12-13 DIAGNOSIS — D649 Anemia, unspecified: Secondary | ICD-10-CM | POA: Diagnosis not present

## 2022-12-13 DIAGNOSIS — R197 Diarrhea, unspecified: Secondary | ICD-10-CM | POA: Diagnosis not present

## 2022-12-13 LAB — COMPREHENSIVE METABOLIC PANEL
ALT: 10 U/L (ref 0–44)
AST: 15 U/L (ref 15–41)
Albumin: 2.5 g/dL — ABNORMAL LOW (ref 3.5–5.0)
Alkaline Phosphatase: 73 U/L (ref 38–126)
Anion gap: 13 (ref 5–15)
BUN: 14 mg/dL (ref 8–23)
CO2: 25 mmol/L (ref 22–32)
Calcium: 8.2 mg/dL — ABNORMAL LOW (ref 8.9–10.3)
Chloride: 94 mmol/L — ABNORMAL LOW (ref 98–111)
Creatinine, Ser: 4.18 mg/dL — ABNORMAL HIGH (ref 0.44–1.00)
GFR, Estimated: 10 mL/min — ABNORMAL LOW (ref >60)
Glucose, Bld: 112 mg/dL — ABNORMAL HIGH (ref 70–99)
Potassium: 3.9 mmol/L (ref 3.5–5.1)
Sodium: 132 mmol/L — ABNORMAL LOW (ref 135–145)
Total Bilirubin: 0.7 mg/dL (ref 0.3–1.2)
Total Protein: 5.1 g/dL — ABNORMAL LOW (ref 6.5–8.1)

## 2022-12-13 LAB — CBC WITH DIFFERENTIAL/PLATELET
Abs Immature Granulocytes: 0.02 10*3/uL (ref 0.00–0.07)
Basophils Absolute: 0.1 10*3/uL (ref 0.0–0.1)
Basophils Relative: 2 %
Eosinophils Absolute: 0.1 10*3/uL (ref 0.0–0.5)
Eosinophils Relative: 2 %
HCT: 22.7 % — ABNORMAL LOW (ref 36.0–46.0)
Hemoglobin: 7.4 g/dL — ABNORMAL LOW (ref 12.0–15.0)
Immature Granulocytes: 0 %
Lymphocytes Relative: 9 %
Lymphs Abs: 0.5 10*3/uL — ABNORMAL LOW (ref 0.7–4.0)
MCH: 35.4 pg — ABNORMAL HIGH (ref 26.0–34.0)
MCHC: 32.6 g/dL (ref 30.0–36.0)
MCV: 108.6 fL — ABNORMAL HIGH (ref 80.0–100.0)
Monocytes Absolute: 0.5 10*3/uL (ref 0.1–1.0)
Monocytes Relative: 11 %
Neutro Abs: 3.8 10*3/uL (ref 1.7–7.7)
Neutrophils Relative %: 76 %
Platelets: 210 10*3/uL (ref 150–400)
RBC: 2.09 MIL/uL — ABNORMAL LOW (ref 3.87–5.11)
RDW: 21.5 % — ABNORMAL HIGH (ref 11.5–15.5)
WBC: 5 10*3/uL (ref 4.0–10.5)
nRBC: 0 % (ref 0.0–0.2)

## 2022-12-13 MED ORDER — ALPRAZOLAM 0.5 MG PO TABS: 0.5000 mg | ORAL_TABLET | Freq: Every day | ORAL | 0 refills | Status: DC | PRN

## 2022-12-13 MED ORDER — AMIODARONE HCL 200 MG PO TABS
100.0000 mg | ORAL_TABLET | Freq: Once | ORAL | Status: DC
  Filled 2022-12-13: qty 1

## 2022-12-13 MED ORDER — DARBEPOETIN ALFA 200 MCG/0.4ML IJ SOSY: 200.0000 ug | PREFILLED_SYRINGE | INTRAMUSCULAR | Status: DC

## 2022-12-13 MED ORDER — METOPROLOL TARTRATE 5 MG/5ML IV SOLN
5.0000 mg | Freq: Once | INTRAVENOUS | Status: AC
  Administered 2022-12-13: 5 mg via INTRAVENOUS
  Filled 2022-12-13: qty 5

## 2022-12-13 MED ORDER — HYDROMORPHONE HCL 1 MG/ML IJ SOLN: 1.0000 mg | Freq: Once | INTRAMUSCULAR | Status: DC

## 2022-12-13 MED ORDER — GABAPENTIN 100 MG PO CAPS: 100.0000 mg | ORAL_CAPSULE | Freq: Every evening | ORAL | 0 refills | Status: DC

## 2022-12-13 MED ORDER — CARVEDILOL 6.25 MG PO TABS
6.2500 mg | ORAL_TABLET | Freq: Once | ORAL | Status: AC
  Administered 2022-12-13: 6.25 mg via ORAL
  Filled 2022-12-13: qty 1

## 2022-12-13 MED ORDER — OXYCODONE HCL 5 MG PO TABS: 10.0000 mg | ORAL_TABLET | Freq: Once | ORAL | Status: DC

## 2022-12-13 MED ORDER — HYDRALAZINE HCL 50 MG PO TABS: 50.0000 mg | ORAL_TABLET | Freq: Three times a day (TID) | ORAL | 0 refills | Status: DC

## 2022-12-13 MED ORDER — ONDANSETRON 4 MG PO TBDP: 4.0000 mg | ORAL_TABLET | Freq: Three times a day (TID) | ORAL | Status: DC | PRN

## 2022-12-13 MED ORDER — SIMETHICONE 80 MG PO CHEW
80.0000 mg | CHEWABLE_TABLET | Freq: Four times a day (QID) | ORAL | Status: DC | PRN
  Administered 2022-12-13: 80 mg via ORAL
  Filled 2022-12-13: qty 1

## 2022-12-13 NOTE — Progress Notes (Signed)
   12/12/22 2356  Assess: MEWS Score  BP 112/68  MAP (mmHg) 81  Pulse Rate (!) 132  Resp 16  SpO2 99 %  O2 Device Room Air  Assess: MEWS Score  MEWS Temp 0  MEWS Systolic 0  MEWS Pulse 3  MEWS RR 0  MEWS LOC 0  MEWS Score 3  MEWS Score Color Yellow  Assess: SIRS CRITERIA  SIRS Temperature  0  SIRS Pulse 1  SIRS Respirations  0  SIRS WBC 0  SIRS Score Sum  1   Md notified, see orders

## 2022-12-13 NOTE — Progress Notes (Signed)
Report called to [employer] from [place].

## 2022-12-13 NOTE — Discharge Summary (Signed)
Physician Discharge Summary   Patient: Shawna [name] MRN: [id number] DOB: 1940-12-06  Admit date:     11/30/2022  Discharge date: 12/13/22  Discharge Physician: Rickey Barbara   PCP: [facility]   Recommendations at discharge:    Follow up with PCP in 1-2 weeks Continue HD as scheduled MWF  Discharge Diagnoses: Active Problems:   AVM (arteriovenous malformation) of small bowel, acquired with hemorrhage   End-stage renal disease on hemodialysis (HCC)   Essential hypertension   PAF (paroxysmal atrial fibrillation) (HCC)   Hx of CABG   Diabetes mellitus type 2 in nonobese (HCC)   Pressure injury of skin   Hypothyroidism  Resolved Problems:   * No resolved hospital problems. *  Hospital Course: Shawna [name] was admitted to the hospital with the working diagnosis of recurrent upper gastrointestinal bleeding.   82 y.o. female with past medical history of end-stage renal disease on hemodialysis Monday Wednesday Friday, seizure disorder, paroxysmal atrial fibrillation not on anticoagulation secondary to history of GI bleed, hypertension, hyperlipidemia, CAD status post CABG and PCI, hypothyroidism, small bowel AVM who was recently admitted for  recurrent GI bleeding with acute on chronic blood loss anemia, to the hospital with frequent loose bowel movements for last 3 days with some blood when wiping wiping. On her initial physical examination her blood pressure was 156/62, HR 62, RR 19 and 02 saturation 98%, lungs with no wheezing or rales, heart with S1 and S2 present irregularly irregular, abdomen with no distention or tenderness, no lower extremity edema.   Na 135, K 3,4 Cl 101, bicarbonate 14 glucose 118, bun 59 cr 7,84 AST 6 ALT 11  Wbc 1,98 hgb 7,1 plt 261  C diff antigen positive, C diff toxin negative, PCR negative.  GI panel infectious negative.   Chest radiograph with cardiomegaly, small bilateral pleural effusions, sternotomy wires in place, HD catheter left  subclavian vein.   EKG 113 bpm, left axis deviation, left bundle branch block, prolonged qtc, atrial fibrillation rhythm with no significant ST segment or T wave changes.   Fecal occult test was positive.   1 unit of packed RBC was transfused.   GI consulted and patient underwent small bowel enteroscopy, finding numerous jejunal AVMs noted and treated with APC.  05/28 no further bleeding, plan to discharge home today, follow up as outpatient.   Assessment and Plan: AVM (arteriovenous malformation) of small bowel, acquired with hemorrhage Patient required one unit PRBC transfusion with good toleration.   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 Six non bleeding angiodysplastic lesions in the proximal jejunum treated with argon plasma coagulation.    Plan to continue antiacid therapy and outpatient octreotide injections.  Follow up with Dr Levon Hedger as outpatient from GI.  Hgb continues to remain stable     End-stage renal disease on hemodialysis [facility] Patient underwent renal replacement therapy during her hospitalization.  She will continue renal replacement therapy with Monday, Wednesday and Friday schedule.  -Cont volume management with HD   Essential hypertension Continued on carvedilol and hydralazine.   Lisinopril remained on hold for now.  Noted to be hypotensive during HD sessions. Agree with holding off on coreg prior to HD as tolerated, hydralazine dose was decreased to 50mg  -BP stable this AM   PAF (paroxysmal atrial fibrillation) (HCC) Continue rate control with carvedilol and amiodarone, no anticoagulation or antiplatelet therapy due to recurrent GI bleeding. Continue beta blocker and amiodarone   Hx of CABG No chest pain.  Continue blood  pressure control.    Diabetes mellitus type 2 in nonobese (HCC) Glucose remained stable during her hospitalization. On SSI while in hospital   Pressure injury of skin, Rectum Stage 2 present on  admit Pressure Injury 11/30/22 Rectum Stage 2 - Partial thickness loss of dermis presenting as a shallow open injury with a red, pink wound bed without slough. open shallow ulcer with pink wound bed    Hypothyroidism Continue with levothyroxine.        Consultants: Nephrology, GI Procedures performed:   Disposition: Skilled nursing facility Diet recommendation:  Discharge Diet Orders (From admission, onward)     Start     Ordered   12/06/22 0000  Diet - low sodium heart healthy        12/06/22 1536           Renal diet DISCHARGE MEDICATION: Allergies as of 12/13/2022       Reactions   Amlodipine Swelling   Aspirin Other (See Comments)   High Doses Mess up her stomach; "makes my bowels have blood in them". Takes 81 mg EC Aspirin    Nitrofurantoin Hives   Ranexa [ranolazine] Other (See Comments)   Myoclonus-hospitalized    Bactrim [sulfamethoxazole-trimethoprim] Rash   Contrast Media [iodinated Contrast Media] Itching   Iron Itching, Other (See Comments)   "they gave me iron in dialysis; had to give me Benadryl cause I had to have the iron" (05/02/2012)   Gabapentin Other (See Comments)   Unknown reaction   Iron Sucrose Other (See Comments)   Unknown   Sucroferric Oxyhydroxide Other (See Comments)   Unknown   Levaquin [levofloxacin In D5w] Rash   Pantoprazole Rash   Plavix [clopidogrel Bisulfate] Rash   Protonix [pantoprazole Sodium] Rash   Venofer [ferric Oxide] Itching, Other (See Comments)   Patient reports using Benadryl prior to doses as [facility]        Medication List     STOP taking these medications    amoxicillin-clavulanate 500-125 MG tablet Commonly known as: AUGMENTIN   Aspirin 81 MG Caps   lisinopril 40 MG tablet Commonly known as: ZESTRIL       TAKE these medications    ALPRAZolam 0.5 MG tablet Commonly known as: XANAX Take 1 tablet (0.5 mg total) by mouth daily as needed for anxiety.   amiodarone 100 MG tablet Commonly  known as: PACERONE Take 1 tablet (100 mg total) by mouth daily.   atorvastatin 10 MG tablet Commonly known as: LIPITOR Take 1 tablet by mouth at bedtime.   benzonatate 100 MG capsule Commonly known as: TESSALON Take 100 mg by mouth 3 (three) times daily as needed for cough.   carvedilol 6.25 MG tablet Commonly known as: COREG Take 1 tablet (6.25 mg total) by mouth 2 (two) times daily with a meal. What changed:  medication strength how much to take   cyclobenzaprine 5 MG tablet Commonly known as: FLEXERIL Take 5 mg by mouth 3 (three) times daily as needed.   folic acid 1 MG tablet Commonly known as: FOLVITE Take 1 mg by mouth daily.   gabapentin 100 MG capsule Commonly known as: NEURONTIN Take 1 capsule (100 mg total) by mouth every evening.   guaiFENesin-codeine 100-10 MG/5ML syrup Take 5 mLs by mouth every 4 (four) hours as needed for cough.   hydrALAZINE 50 MG tablet Commonly known as: APRESOLINE Take 1 tablet (50 mg total) by mouth every 8 (eight) hours. What changed:  medication strength how much to take  hydrOXYzine 25 MG tablet Commonly known as: ATARAX Take 25 mg by mouth every 6 (six) hours as needed for itching.   levETIRAcetam 500 MG tablet Commonly known as: KEPPRA Take 1 tablet (500 mg total) by mouth daily.   levothyroxine 75 MCG tablet Commonly known as: SYNTHROID Take 1 tablet (75 mcg total) by mouth daily before breakfast.   multivitamin Tabs tablet Take 1 tablet by mouth daily.   nitroGLYCERIN 0.4 MG SL tablet Commonly known as: NITROSTAT Place 0.4 mg under the tongue every 5 (five) minutes as needed for chest pain.   omeprazole 40 MG capsule Commonly known as: PRILOSEC Take 1 capsule (40 mg total) by mouth daily.   ondansetron 4 MG disintegrating tablet Commonly known as: ZOFRAN-ODT Take 4 mg by mouth every 8 (eight) hours as needed for nausea or vomiting.   rosuvastatin 10 MG tablet Commonly known as: CRESTOR Take 1 tablet by  mouth once daily   SandoSTATIN LAR [place] 20 MG injection Generic drug: octreotide Inject 20 mg into the muscle every 28 (twenty-eight) days.   sevelamer carbonate 800 MG tablet Commonly known as: RENVELA Take 3,200 mg by mouth in the morning and at bedtime.        Follow-up Information     [place]. Go on 12/12/2022.   Why: Schedule is Monday,Wednesday,Friday. Arrive at 10:30 am for 10:45 am chair time. Contact information: [address] [place] [address] [phone]         [facility] Follow up in 2 week(s).   Contact information: [address] [place] [address] [phone]                Discharge Exam: Ceasar Mons Weights   12/09/22 1115 12/12/22 1333 12/12/22 1726  Weight: 58.9 kg 60.3 kg 56.9 kg   General exam: Awake, laying in bed, in nad Respiratory system: Normal respiratory effort, no wheezing Cardiovascular system: regular rate, s1, s2 Gastrointestinal system: Soft, nondistended, positive BS Central nervous system: CN2-12 grossly intact, strength intact Extremities: Perfused, no clubbing Skin: Normal skin turgor, no notable skin lesions seen Psychiatry: Mood normal // no visual hallucinations   Condition at discharge: fair  The results of significant diagnostics from this hospitalization (including imaging, microbiology, ancillary and laboratory) are listed below for reference.   Imaging Studies: DG CHEST PORT 1 VIEW  Result Date: 12/04/2022 CLINICAL DATA:  [id number] Fever [id number] EXAM: PORTABLE CHEST 1 VIEW COMPARISON:  CTA GI bleed 11/13/2022 FINDINGS: Left chest wall dialysis catheter with tip overlying the right atrium. The heart and mediastinal contours are unchanged. Surgical changes likely mediastinum. Coronary artery stent. Aortic calcification. Bibasilar atelectasis. Persistent retrocardiac airspace opacity. No pulmonary edema. Persistent trace right and trace to small left pleural effusions. No  pneumothorax. No acute osseous abnormality. Degenerative changes of the right shoulder. Intact sternotomy wires. IMPRESSION: 1. Persistent retrocardiac airspace opacity. 2. Persistent trace right and trace to small left pleural effusions. Electronically Signed   By: Tish Frederickson M.D.   On: 12/04/2022 22:07   IR Removal Tun Cv Cath W/O FL  Result Date: 11/15/2022 INDICATION: IR placed non tunneled right common femoral central venous catheter due to poor venous access 11/12/2022. Request received to remove non tunneled right femoral catheter for discharge today. EXAM: REMOVAL NON TUNNELED RIGHT COMMON FEMORAL CENTRAL VENOUS CATHETER MEDICATIONS: None COMPLICATIONS: None immediate. PROCEDURE: Verbal consent was obtained from the patient following an explanation of the procedure, risks, benefits and alternatives to treatment. A time  out was performed prior to the initiation of the procedure. Aseptic technique was utilized including mask, gloves and hand hygiene. Single suture was removed. The catheter was removed intact. Hemostasis was obtained with manual compression. A dressing was placed. The patient tolerated the procedure well without immediate post procedural complication. IMPRESSION: Successful removal non tunneled right common femoral central venous catheter. Read by: Alex Gardener, AGNP-BC Electronically Signed   By: Acquanetta Belling M.D.   On: 11/15/2022 14:03   DG Chest Port 1 View  Result Date: 11/15/2022 CLINICAL DATA:  Shortness of breath.  History of anemia. EXAM: PORTABLE CHEST 1 VIEW COMPARISON:  10/28/2022 FINDINGS: Status post median sternotomy and CABG procedure. Left sided dialysis catheter noted with tips in the right atrium. Stable mild cardiac enlargement. No pleural effusion, interstitial edema or airspace disease. No acute bone abnormality noted. IMPRESSION: No acute cardiopulmonary abnormalities. Electronically Signed   By: Signa Kell M.D.   On: 11/15/2022 06:53     Microbiology: Results for orders placed or performed during the hospital encounter of 11/30/22  C Difficile Quick Screen w PCR reflex     Status: Abnormal   Collection Time: 11/30/22  6:24 PM   Specimen: STOOL  Result Value Ref Range Status   C Diff antigen POSITIVE (A) NEGATIVE Final   C Diff toxin NEGATIVE NEGATIVE Final   C Diff interpretation Results are indeterminate. See PCR results.  Final    Comment: Performed at [facility], [address], [place], [place] [address]  Gastrointestinal Panel by PCR , Stool     Status: None   Collection Time: 11/30/22  6:24 PM   Specimen: STOOL  Result Value Ref Range Status   Campylobacter species NOT DETECTED NOT DETECTED Final   Plesimonas shigelloides NOT DETECTED NOT DETECTED Final   Salmonella species NOT DETECTED NOT DETECTED Final   Yersinia enterocolitica NOT DETECTED NOT DETECTED Final   Vibrio species NOT DETECTED NOT DETECTED Final   Vibrio cholerae NOT DETECTED NOT DETECTED Final   Enteroaggregative E coli (EAEC) NOT DETECTED NOT DETECTED Final   Enteropathogenic E coli (EPEC) NOT DETECTED NOT DETECTED Final   Enterotoxigenic E coli (ETEC) NOT DETECTED NOT DETECTED Final   Shiga like toxin producing E coli (STEC) NOT DETECTED NOT DETECTED Final   Shigella/Enteroinvasive E coli (EIEC) NOT DETECTED NOT DETECTED Final   Cryptosporidium NOT DETECTED NOT DETECTED Final   Cyclospora cayetanensis NOT DETECTED NOT DETECTED Final   Entamoeba histolytica NOT DETECTED NOT DETECTED Final   Giardia lamblia NOT DETECTED NOT DETECTED Final   Adenovirus F40/41 NOT DETECTED NOT DETECTED Final   Astrovirus NOT DETECTED NOT DETECTED Final   Norovirus GI/GII NOT DETECTED NOT DETECTED Final   Rotavirus A NOT DETECTED NOT DETECTED Final   Sapovirus (I, II, IV, and V) NOT DETECTED NOT DETECTED Final    Comment: Performed at [facility], [address] [place], [place] [address]  C. Diff by PCR, Reflexed     Status: None    Collection Time: 11/30/22  6:24 PM  Result Value Ref Range Status   Toxigenic C. Difficile by PCR NEGATIVE NEGATIVE Final    Comment: Patient is colonized with non toxigenic C. difficile. May not need treatment unless significant symptoms are present. Performed at [facility], [address] [place], [place] [address]   Culture, blood (Routine X 2) w Reflex to ID Panel     Status: None   Collection Time: 12/04/22  3:38 PM  Specimen: BLOOD  Result Value Ref Range Status   Specimen Description BLOOD SITE NOT SPECIFIED  Final   Special Requests   Final    BOTTLES DRAWN AEROBIC ONLY Blood Culture results may not be optimal due to an inadequate volume of blood received in culture bottles   Culture   Final    NO GROWTH 5 DAYS Performed at [facility], [address] [place], [place] [address]    Report Status 12/09/2022 FINAL  Final  Culture, blood (Routine X 2) w Reflex to ID Panel     Status: None   Collection Time: 12/04/22  8:13 PM   Specimen: BLOOD LEFT HAND  Result Value Ref Range Status   Specimen Description BLOOD LEFT HAND  Final   Special Requests   Final    BOTTLES DRAWN AEROBIC AND ANAEROBIC Blood Culture adequate volume   Culture   Final    NO GROWTH 5 DAYS Performed at [facility], [address] [place], [place] [address]    Report Status 12/09/2022 FINAL  Final  MRSA Next Gen by PCR, Nasal     Status: None   Collection Time: 12/05/22  1:37 PM   Specimen: Nasal Mucosa; Nasal Swab  Result Value Ref Range Status   MRSA by PCR Next Gen NOT DETECTED NOT DETECTED Final    Comment: (NOTE) The GeneXpert MRSA Assay (FDA approved for NASAL specimens only), is one component of a comprehensive MRSA colonization surveillance program. It is not intended to diagnose MRSA infection nor to guide or monitor treatment for MRSA infections. Test performance is not FDA approved in patients less than 80 years old. Performed at [facility], [address] [place], [place] [address]    *Note: Due to a large number of results and/or encounters for the requested time period, some results have not been displayed. A complete set of results can be found in Results Review.    Labs: CBC: Recent Labs  Lab 12/08/22 1340 12/09/22 0206 12/12/22 1401 12/13/22 0337  WBC 6.3 5.9 5.5 5.0  NEUTROABS 4.9 4.2  --  3.8  HGB 7.3* 7.3* 7.1* 7.4*  HCT 23.5* 22.8* 22.2* 22.7*  MCV 109.8* 109.6* 112.1* 108.6*  PLT 214 229 234 210   Basic Metabolic Panel: Recent Labs  Lab 12/07/22 0703 12/09/22 0206 12/11/22 0231 12/12/22 1404 12/13/22 0337  NA 132* 131* 130* 131* 132*  K 4.4 4.8 4.7 4.6 3.9  CL 95* 94* 93* 92* 94*  CO2 24 27 23 27 25   GLUCOSE 82 98 88 106* 112*  BUN 39* 30* 28* 40* 14  CREATININE 6.49* 6.11* 5.28* 7.59* 4.18*  CALCIUM 8.7* 8.6* 8.3* 8.7* 8.2*  MG 1.4* 2.7*  --   --   --   PHOS 4.1 3.7  --   --   --    Liver Function Tests: Recent Labs  Lab 12/07/22 0703 12/09/22 0206 12/11/22 0231 12/12/22 1404 12/13/22 0337  AST  --   --  18 13* 15  ALT  --   --  9 10 10   ALKPHOS  --   --  60 77 73  BILITOT  --   --  0.8 0.6 0.7  PROT  --   --  4.7* 5.0* 5.1*  ALBUMIN 2.3* 2.3* 2.3* 2.4* 2.5*   CBG: Recent Labs  Lab 12/06/22 1637 12/06/22 2043 12/08/22 1149  GLUCAP 88 167* 92    Discharge time spent: less than 30 minutes.  Signed: Rickey Barbara, MD [facility] 12/13/2022

## 2022-12-13 NOTE — Progress Notes (Signed)
[facility] Progress Note   Subjective: Pleasant elderly female, wants to go home. Doesn't understand "Why do I have to go to a nursing home when I have a six room house?". HGB 7.4 this AM. HD tomorrow on schedule.   Objective Vitals:   12/13/22 0300 12/13/22 0555 12/13/22 0833 12/13/22 0915  BP: 100/64 107/70  (!) 148/70  Pulse: (!) 122 (!) 126 72 70  Resp:  16  18  Temp:  98.7 F (37.1 C)  99 F (37.2 C)  TempSrc:  Oral  Oral  SpO2:  98%  95%  Weight:      Height:       Physical Exam General: Pleasant elderly female, alert, NAD Heart: S1,S2 RRR SR BBB Lungs: CTAB Abdomen: NABS, NT Extremities: No LE edema Dialysis Access:  [facility] dr[facility] intact  Additional Objective Labs: Basic Metabolic Panel: Recent Labs  Lab 12/07/22 0703 12/09/22 0206 12/11/22 0231 12/12/22 1404 12/13/22 0337  NA 132* 131* 130* 131* 132*  K 4.4 4.8 4.7 4.6 3.9  CL 95* 94* 93* 92* 94*  CO2 24 27 23 27 25   GLUCOSE 82 98 88 106* 112*  BUN 39* 30* 28* 40* 14  CREATININE 6.49* 6.11* 5.28* 7.59* 4.18*  CALCIUM 8.7* 8.6* 8.3* 8.7* 8.2*  PHOS 4.1 3.7  --   --   --    Liver Function Tests: Recent Labs  Lab 12/11/22 0231 12/12/22 1404 12/13/22 0337  AST 18 13* 15  ALT 9 10 10   ALKPHOS 60 77 73  BILITOT 0.8 0.6 0.7  PROT 4.7* 5.0* 5.1*  ALBUMIN 2.3* 2.4* 2.5*   No results for input(s): "LIPASE", "AMYLASE" in the last 168 hours. CBC: Recent Labs  Lab 12/08/22 1340 12/09/22 0206 12/12/22 1401 12/13/22 0337  WBC 6.3 5.9 5.5 5.0  NEUTROABS 4.9 4.2  --  3.8  HGB 7.3* 7.3* 7.1* 7.4*  HCT 23.5* 22.8* 22.2* 22.7*  MCV 109.8* 109.6* 112.1* 108.6*  PLT 214 229 234 210   Blood Culture    Component Value Date/Time   SDES BLOOD LEFT HAND 12/04/2022 2013   SPECREQUEST  12/04/2022 2013    BOTTLES DRAWN AEROBIC AND ANAEROBIC Blood Culture adequate volume   CULT  12/04/2022 2013    NO GROWTH 5 DAYS Performed at [facility], [address], [place], [place] [address]     REPTSTATUS 12/09/2022 FINAL 12/04/2022 2013    Cardiac Enzymes: No results for input(s): "CKTOTAL", "CKMB", "CKMBINDEX", "TROPONINI" in the last 168 hours. CBG: Recent Labs  Lab 12/06/22 1637 12/06/22 2043 12/08/22 1149  GLUCAP 88 167* 92   Iron Studies: No results for input(s): "IRON", "TIBC", "TRANSFERRIN", "FERRITIN" in the last 72 hours. @lablastinr3 @ Studies/Results: No results found. Medications:   amiodarone  100 mg Oral Daily   amiodarone  100 mg Oral Once   atorvastatin  10 mg Oral QHS   calcitRIOL  1.75 mcg Oral Q M,W,F-1800   carvedilol  6.25 mg Oral BID WC   Chlorhexidine Gluconate Cloth  6 each Topical Q0600   Chlorhexidine Gluconate Cloth  6 each Topical Q0600   cinacalcet  30 mg Oral Q M,W,F-1800   folic acid  1 mg Oral Daily   heparin sodium (porcine)  3,800 Units Intracatheter Once   hydrALAZINE  50 mg Oral Q8H   levETIRAcetam  500 mg Oral Daily   levothyroxine  75 mcg Oral QAC breakfast   oxyCODONE  10 mg Oral Once   pantoprazole  40 mg Oral  Daily   sevelamer carbonate  3,200 mg Oral TID WC     OP Dialysis Orders: [name] EDW 58.5kg TDC 2K 2.5Ca BFR 300 DFR 500 Calcitrol 1.44mcg PO q HD Sensipar 30mg  PO q HD Mircera 200 mcg IV q 2 weeks- Last dose 11/28/22   Assessment/Plan: 1.  Acute blood loss anemia secondary to rectal bleeding: Recurrent issue with EGD 5/23 and APC of jejunal arteriovenous malformations/ angiodysplasia by Dr. Adela Lank.  She has not had any additional hematochezia or melena overnight . Transfused 1 unit prbcs on 5/22.  Hemoglobin now 7.4 On high dose ESA at her outpatient clinic - got Mircera 200 on 5/20.  Give Aranesp 200 mcg SQ with HD tomorrow. Transfuse if HGB < 7.0.  2.  End-stage renal disease: Usually on a Monday/Wednesday/Friday schedule. HD 12/14/2022 3.  Hypokalemia: Likely secondary to limited intake and diarrhea prior to admission-improved with supplementation and 4K dialysate. Resolved.  4.  Secondary  hyperparathyroidism: On sevelamer for phosphorus binding with acceptable calcium and phosphorus level. Will resume her calcitriol and Sensipar TIW 5.  Hypertension: Blood pressures variable but acceptable.  Resumed on oral antihypertensive therapy. Net UF with HD 2 liters yesterday. Post wt 56.9 kg. Will need lower EDW on discharge.  6.  Atrial fibrillation: Pt had an episode of a.fib, hypotension and chest discomfort on HD yesterday which resolved after receiving her amiodarone and coreg. Would not hold amiodarone prior to HD, but she does not typically take her coreg pre-HD due to hypotension. Not a candidate for anticoagulation due to recurrent GI bleed. 7. Hypomagnesium: Repleted with 4GM IV Mg for Mg of 1.4, repeat Mg 2.7 12/09/22. Resolved.  7. Dispo: Plan to discharge to SNF for rehab   Nile Prisk H. Jatavious Peppard NP-C 12/13/2022, 11:15 AM  [employer] [employer] [phone]

## 2022-12-13 NOTE — TOC Transition Note (Signed)
Transition of Care [facility]) - CM/SW Discharge Note   Patient Details  Name: Shawna [name] MRN: [id number] Date of Birth: [birth year]  Transition of Care [facility]) CM/SW Contact:  Ralene Bathe, LCSW Phone Number: 12/13/2022, 2:37 PM   Clinical Narrative:    Patient will DC to:  [facility] SNF Anticipated DC date: 12/13/2022 Family notified: yes, patient's spouse Transport by:  Sharin Mons   Per MD patient ready for DC to SNF. RN to call report prior to discharge [phone] room A2-1). RN, patient, patient's family, and facility notified of DC. Discharge Summary  sent to facility. Ambulance transport requested for patient.   CSW will sign off for now as social work intervention is no longer needed. Please consult [place] again if new needs arise.    Final next level of care: Skilled Nursing Facility Barriers to Discharge: Barriers Resolved   Patient Goals and CMS Choice CMS Medicare.gov Compare Post Acute Care list provided to:: Patient Choice offered to / list presented to : Patient, Spouse  Discharge Placement                Patient chooses bed at:  [facility]) Patient to be transferred to facility by: [facility] Name of family member notified: Alles,Nathaniel (Spouse) [phone] Patient and family notified of of transfer: 12/13/22  Discharge Plan and Services Additional resources added to the After Visit Summary for                                       Social Determinants of Health (SDOH) Interventions SDOH Screenings   Food Insecurity: No Food Insecurity (12/12/2022)  Housing: Low Risk  (12/12/2022)  Transportation Needs: No Transportation Needs (12/12/2022)  Utilities: Not At Risk (12/12/2022)  Financial Resource Strain: Low Risk  (12/14/2018)  Physical Activity: Unknown (12/14/2018)  Social Connections: Unknown (12/14/2018)  Stress: No Stress Concern Present (12/14/2018)  Tobacco Use: Low Risk  (12/12/2022)     Readmission Risk Interventions    10/31/2022   12:45 PM  08/29/2022    4:47 PM 11/12/2021    8:56 AM  Readmission Risk Prevention Plan  Transportation Screening Complete Complete Complete  Medication Review Oceanographer) Complete Complete Complete  PCP or Specialist appointment within 3-5 days of discharge  Complete Complete  HRI or Home Care Consult Complete Complete Complete  SW Recovery Care/Counseling Consult Complete Complete Complete  Palliative Care Screening Not Applicable Not Applicable Not Applicable  Skilled Nursing Facility Not Applicable Complete Complete

## 2022-12-13 NOTE — Progress Notes (Signed)
Pt discharged via [facility] to [facility], belongings are with pt including clothes and eyeglasses

## 2022-12-13 NOTE — Progress Notes (Signed)
D/C order noted. Contacted [facility] and spoke to [place]. Clinic advised of pt's d/c today and that pt should resume care tomorrow. Clinic advised that pt will be going to [facility] at d/c. D/C summary and today's renal note faxed to clinic for continuation of care.   Olivia Canter Renal Navigator [phone]

## 2022-12-13 NOTE — TOC Progression Note (Signed)
Transition of Care [facility]) - Initial/Assessment Note    Patient Details  Name: Shawna [name] MRN: [id number] Date of Birth: 1941-06-17  Transition of Care [facility]) CM/SW Contact:    Ralene Bathe, LCSW Phone Number: 12/13/2022, 8:59 AM  Clinical Narrative:                 Insurance authorization has been approved for SNF.  MD notified.  TOC following.     Barriers to Discharge: Continued Medical Work up   Patient Goals and CMS Choice Patient states their goals for this hospitalization and ongoing recovery are:: Husband requests rehab then return home. CMS Medicare.gov Compare Post Acute Care list provided to:: Patient Choice offered to / list presented to : Patient, Spouse      Expected Discharge Plan and Services       Living arrangements for the past 2 months: Single Family Home Expected Discharge Date: 12/06/22                                    Prior Living Arrangements/Services Living arrangements for the past 2 months: Single Family Home Lives with:: Spouse Patient language and need for interpreter reviewed:: Yes Do you feel safe going back to the place where you live?: Yes      Need for Family Participation in Patient Care: Yes (Comment) Care giver support system in place?: Yes (comment) Current home services: DME (Cane, walker, rollator, shower seat, grab bars, wheelchair.) Criminal Activity/Legal Involvement Pertinent to Current Situation/Hospitalization: No - Comment as needed  Activities of Daily Living Home Assistive Devices/Equipment: Cane (specify quad or straight) ADL Screening (condition at time of admission) Patient's cognitive ability adequate to safely complete daily activities?: Yes Is the patient deaf or have difficulty hearing?: No Does the patient have difficulty seeing, even when wearing glasses/contacts?: No Does the patient have difficulty concentrating, remembering, or making decisions?: No Patient able to express need for assistance  with ADLs?: No Does the patient have difficulty dressing or bathing?: No Independently performs ADLs?: Yes (appropriate for developmental age) Does the patient have difficulty walking or climbing stairs?: No Weakness of Legs: None Weakness of Arms/Hands: None  Permission Sought/Granted Permission sought to share information with : Case Manager, Family Supports Permission granted to share information with : Yes, Verbal Permission Granted              Emotional Assessment Appearance:: Appears stated age Attitude/Demeanor/Rapport: Engaged, Gracious Affect (typically observed): Accepting, Appropriate, Calm, Hopeful, Pleasant Orientation: : Oriented to Self, Oriented to Place Alcohol / Substance Use: Not Applicable Psych Involvement: No (comment)  Admission diagnosis:  Rectal bleeding [K62.5] Symptomatic anemia [D64.9] Diarrhea, unspecified type [R19.7] Patient Active Problem List   Diagnosis Date Noted   Pressure injury of skin 12/01/2022   Rectal bleeding 11/30/2022   Generalized abdominal pain 11/10/2022   Ileus (HCC) 11/04/2022   Periumbilical abdominal pain 11/04/2022   Symptomatic anemia 10/29/2022   Anemia of chronic disease 10/29/2022   Upper GI bleed 10/29/2022   Iron deficiency anemia due to chronic blood loss 08/26/2022   Benign neoplasm of transverse colon 08/11/2022   Benign neoplasm of descending colon 08/11/2022   Benign neoplasm of sigmoid colon 08/11/2022   Angiodysplasia of duodenum 08/11/2022   Delirium 04/22/2022   History of arteriovenous malformation (AVM) 04/22/2022   Acute metabolic encephalopathy 11/09/2021   NSTEMI, initial episode of care (HCC) 10/19/2021   Acute on chronic  blood loss anemia 09/09/2021   Ischemic cardiomyopathy    Atrial fibrillation, chronic (HCC) 05/07/2021   Neuropathy 04/23/2021   Anemia associated with chronic renal failure 04/13/2021   Left knee pain 03/15/2021   Moderate protein-calorie malnutrition (HCC) 02/27/2021    Hypokalemia 02/27/2021   COVID-19 virus infection 02/03/2021   Leukocytosis 02/03/2021   Hypothyroidism 02/03/2021   Myositis 12/03/2020   Ataxia 12/02/2020   Diabetes mellitus type 2 in nonobese (HCC) 11/10/2020   Failure to thrive in adult 11/09/2020   Dialysis AV fistula malfunction, initial encounter (HCC)    Jugular vein occlusion, right (HCC)    Failure of surgically constructed arteriovenous fistula (HCC) 10/03/2020   Myoclonus 08/31/2020   Clotted renal dialysis AV graft, initial encounter (HCC)    Hemodialysis-associated hypotension    Irritable bowel syndrome 02/25/2020   Adenomatous duodenal polyp 09/10/2019   History of GI bleed 09/10/2019   Hand steal syndrome (HCC) 08/01/2017   CAD (coronary artery disease) 06/05/2017   Intestinal ischemia (HCC)    Complication of vascular access for dialysis 03/19/2017   Preoperative clearance 01/25/2017   H/O non-ST elevation myocardial infarction (NSTEMI) 10/24/2016   Non-ST elevation (NSTEMI) myocardial infarction [facility])    Heme positive stool    Cardiac arrest [facility])    Palliative care encounter    Goals of care, counseling/discussion    Flash pulmonary edema (HCC) 04/06/2016   History of colon cancer 01/27/2016   History of ovarian cancer 01/27/2016   PAF (paroxysmal atrial fibrillation) (HCC) 10/14/2015   Malignant neoplasm of right ovary (HCC) 10/14/2015   SVT (supraventricular tachycardia) 09/08/2015   Essential hypertension    Dyspnea    Constipation 03/12/2013   Abdominal wall pain in left flank 03/12/2013   Occlusion and stenosis of carotid artery without mention of cerebral infarction 01/24/2013   Hx of CABG 07/05/2012   Carotid artery disease (HCC) 07/05/2012   Mitral regurgitation 06/12/2012   Non-STEMI (non-ST elevated myocardial infarction) (HCC) 06/08/2012   Chronic combined systolic and diastolic CHF (congestive heart failure) (HCC) 05/02/2012   AVM (arteriovenous malformation) of small bowel, acquired with  hemorrhage 01/20/2012   GERD (gastroesophageal reflux disease) 01/09/2012   HLD (hyperlipidemia) 01/05/2012   Atherosclerotic heart disease of native coronary artery without angina pectoris 12/16/2011   Anxiety disorder 05/04/2011   End-stage renal disease on hemodialysis (HCC) 04/29/2011   Gout 04/29/2011   PCP:  Practice, [facility]:   [facility] [address], [place] - [address] [place] [address] Phone: [phone] Fax: [phone]  [facility] (ESRD Bundle Only) - [place], [place] - [address] [place] [address] Phone: [phone] Fax: [phone]  [facility] - [address] [place], [place] - [address] [place] [address] Phone: [phone] Fax: [phone]  [facility] [address] [place] [address] Phone: [phone] Fax: [phone]     Social Determinants of Health (SDOH) Social History: SDOH Screenings   Food Insecurity: No Food Insecurity (12/12/2022)  Housing: Low Risk  (12/12/2022)  Transportation Needs: No Transportation Needs (12/12/2022)  Utilities: Not At Risk (12/12/2022)  Financial Resource Strain: Low Risk  (12/14/2018)  Physical Activity: Unknown (12/14/2018)  Social Connections: Unknown (12/14/2018)  Stress: No Stress Concern Present (12/14/2018)  Tobacco Use: Low Risk  (12/12/2022)   SDOH Interventions: Transportation Interventions: Intervention Not Indicated, Inpatient TOC, Patient Resources (Friends/Family)   Readmission Risk Interventions    10/31/2022  12:45 PM 08/29/2022    4:47 PM 11/12/2021    8:56 AM  Readmission Risk Prevention Plan  Transportation Screening Complete Complete Complete  Medication Review Oceanographer) Complete Complete Complete  PCP or Specialist appointment within 3-5 days of discharge  Complete Complete  HRI or Home Care Consult Complete Complete Complete  SW Recovery Care/Counseling Consult Complete  Complete Complete  Palliative Care Screening Not Applicable Not Applicable Not Applicable  Skilled Nursing Facility Not Applicable Complete Complete

## 2022-12-14 ENCOUNTER — Other Ambulatory Visit: Payer: Self-pay

## 2022-12-14 ENCOUNTER — Emergency Department (HOSPITAL_COMMUNITY): Payer: Medicare Other

## 2022-12-14 ENCOUNTER — Encounter (HOSPITAL_COMMUNITY): Payer: Self-pay

## 2022-12-14 ENCOUNTER — Inpatient Hospital Stay (HOSPITAL_COMMUNITY)
Admission: EM | Admit: 2022-12-14 | Discharge: 2022-12-28 | DRG: 308 | Disposition: A | Discharge status: Skilled Nursing Facility | Payer: Medicare Other | Source: Skilled Nursing Facility | Attending: Internal Medicine | Admitting: Internal Medicine

## 2022-12-14 DIAGNOSIS — N2581 Secondary hyperparathyroidism of renal origin: Secondary | ICD-10-CM | POA: Diagnosis present

## 2022-12-14 DIAGNOSIS — N186 End stage renal disease: Secondary | ICD-10-CM

## 2022-12-14 DIAGNOSIS — J810 Acute pulmonary edema: Secondary | ICD-10-CM | POA: Diagnosis not present

## 2022-12-14 DIAGNOSIS — E782 Mixed hyperlipidemia: Secondary | ICD-10-CM | POA: Diagnosis present

## 2022-12-14 DIAGNOSIS — I081 Rheumatic disorders of both mitral and tricuspid valves: Secondary | ICD-10-CM | POA: Diagnosis present

## 2022-12-14 DIAGNOSIS — Z515 Encounter for palliative care: Secondary | ICD-10-CM | POA: Diagnosis not present

## 2022-12-14 DIAGNOSIS — E119 Type 2 diabetes mellitus without complications: Secondary | ICD-10-CM | POA: Diagnosis not present

## 2022-12-14 DIAGNOSIS — Z79899 Other long term (current) drug therapy: Secondary | ICD-10-CM | POA: Diagnosis not present

## 2022-12-14 DIAGNOSIS — Z7189 Other specified counseling: Secondary | ICD-10-CM | POA: Diagnosis not present

## 2022-12-14 DIAGNOSIS — Z951 Presence of aortocoronary bypass graft: Secondary | ICD-10-CM

## 2022-12-14 DIAGNOSIS — E1122 Type 2 diabetes mellitus with diabetic chronic kidney disease: Secondary | ICD-10-CM | POA: Diagnosis present

## 2022-12-14 DIAGNOSIS — Z955 Presence of coronary angioplasty implant and graft: Secondary | ICD-10-CM

## 2022-12-14 DIAGNOSIS — Z881 Allergy status to other antibiotic agents status: Secondary | ICD-10-CM

## 2022-12-14 DIAGNOSIS — I4719 Other supraventricular tachycardia: Secondary | ICD-10-CM | POA: Diagnosis present

## 2022-12-14 DIAGNOSIS — I480 Paroxysmal atrial fibrillation: Secondary | ICD-10-CM | POA: Diagnosis present

## 2022-12-14 DIAGNOSIS — I447 Left bundle-branch block, unspecified: Secondary | ICD-10-CM | POA: Diagnosis present

## 2022-12-14 DIAGNOSIS — R0789 Other chest pain: Secondary | ICD-10-CM

## 2022-12-14 DIAGNOSIS — D509 Iron deficiency anemia, unspecified: Secondary | ICD-10-CM | POA: Diagnosis present

## 2022-12-14 DIAGNOSIS — Z800 Family history of malignant neoplasm of digestive organs: Secondary | ICD-10-CM

## 2022-12-14 DIAGNOSIS — Z751 Person awaiting admission to adequate facility elsewhere: Secondary | ICD-10-CM

## 2022-12-14 DIAGNOSIS — Z833 Family history of diabetes mellitus: Secondary | ICD-10-CM

## 2022-12-14 DIAGNOSIS — Z789 Other specified health status: Secondary | ICD-10-CM | POA: Diagnosis not present

## 2022-12-14 DIAGNOSIS — Z8249 Family history of ischemic heart disease and other diseases of the circulatory system: Secondary | ICD-10-CM

## 2022-12-14 DIAGNOSIS — G9341 Metabolic encephalopathy: Secondary | ICD-10-CM | POA: Diagnosis not present

## 2022-12-14 DIAGNOSIS — R4189 Other symptoms and signs involving cognitive functions and awareness: Secondary | ICD-10-CM | POA: Diagnosis present

## 2022-12-14 DIAGNOSIS — I5022 Chronic systolic (congestive) heart failure: Secondary | ICD-10-CM | POA: Diagnosis present

## 2022-12-14 DIAGNOSIS — D539 Nutritional anemia, unspecified: Secondary | ICD-10-CM | POA: Diagnosis not present

## 2022-12-14 DIAGNOSIS — Z711 Person with feared health complaint in whom no diagnosis is made: Secondary | ICD-10-CM | POA: Diagnosis not present

## 2022-12-14 DIAGNOSIS — G40909 Epilepsy, unspecified, not intractable, without status epilepticus: Secondary | ICD-10-CM | POA: Diagnosis present

## 2022-12-14 DIAGNOSIS — Z79890 Hormone replacement therapy (postmenopausal): Secondary | ICD-10-CM

## 2022-12-14 DIAGNOSIS — Z91041 Radiographic dye allergy status: Secondary | ICD-10-CM

## 2022-12-14 DIAGNOSIS — Z8616 Personal history of COVID-19: Secondary | ICD-10-CM

## 2022-12-14 DIAGNOSIS — Z888 Allergy status to other drugs, medicaments and biological substances status: Secondary | ICD-10-CM

## 2022-12-14 DIAGNOSIS — R509 Fever, unspecified: Secondary | ICD-10-CM | POA: Diagnosis not present

## 2022-12-14 DIAGNOSIS — F419 Anxiety disorder, unspecified: Secondary | ICD-10-CM | POA: Diagnosis present

## 2022-12-14 DIAGNOSIS — I953 Hypotension of hemodialysis: Secondary | ICD-10-CM | POA: Diagnosis not present

## 2022-12-14 DIAGNOSIS — R5383 Other fatigue: Secondary | ICD-10-CM | POA: Diagnosis present

## 2022-12-14 DIAGNOSIS — Z85038 Personal history of other malignant neoplasm of large intestine: Secondary | ICD-10-CM

## 2022-12-14 DIAGNOSIS — E039 Hypothyroidism, unspecified: Secondary | ICD-10-CM | POA: Diagnosis present

## 2022-12-14 DIAGNOSIS — Z8711 Personal history of peptic ulcer disease: Secondary | ICD-10-CM

## 2022-12-14 DIAGNOSIS — Z886 Allergy status to analgesic agent status: Secondary | ICD-10-CM

## 2022-12-14 DIAGNOSIS — I132 Hypertensive heart and chronic kidney disease with heart failure and with stage 5 chronic kidney disease, or end stage renal disease: Secondary | ICD-10-CM | POA: Diagnosis present

## 2022-12-14 DIAGNOSIS — Z83438 Family history of other disorder of lipoprotein metabolism and other lipidemia: Secondary | ICD-10-CM

## 2022-12-14 DIAGNOSIS — Z8543 Personal history of malignant neoplasm of ovary: Secondary | ICD-10-CM

## 2022-12-14 DIAGNOSIS — R000 Tachycardia, unspecified: Secondary | ICD-10-CM | POA: Diagnosis present

## 2022-12-14 DIAGNOSIS — I10 Essential (primary) hypertension: Secondary | ICD-10-CM | POA: Diagnosis not present

## 2022-12-14 DIAGNOSIS — I4892 Unspecified atrial flutter: Secondary | ICD-10-CM | POA: Diagnosis not present

## 2022-12-14 DIAGNOSIS — I25119 Atherosclerotic heart disease of native coronary artery with unspecified angina pectoris: Secondary | ICD-10-CM | POA: Diagnosis present

## 2022-12-14 DIAGNOSIS — E8770 Fluid overload, unspecified: Secondary | ICD-10-CM | POA: Diagnosis present

## 2022-12-14 DIAGNOSIS — R001 Bradycardia, unspecified: Secondary | ICD-10-CM | POA: Diagnosis present

## 2022-12-14 DIAGNOSIS — Z66 Do not resuscitate: Secondary | ICD-10-CM | POA: Diagnosis not present

## 2022-12-14 DIAGNOSIS — Z992 Dependence on renal dialysis: Secondary | ICD-10-CM | POA: Diagnosis not present

## 2022-12-14 DIAGNOSIS — I2489 Other forms of acute ischemic heart disease: Secondary | ICD-10-CM | POA: Diagnosis not present

## 2022-12-14 DIAGNOSIS — Z8673 Personal history of transient ischemic attack (TIA), and cerebral infarction without residual deficits: Secondary | ICD-10-CM

## 2022-12-14 DIAGNOSIS — D631 Anemia in chronic kidney disease: Secondary | ICD-10-CM | POA: Diagnosis present

## 2022-12-14 DIAGNOSIS — Z90710 Acquired absence of both cervix and uterus: Secondary | ICD-10-CM

## 2022-12-14 DIAGNOSIS — B9781 Human metapneumovirus as the cause of diseases classified elsewhere: Secondary | ICD-10-CM | POA: Diagnosis not present

## 2022-12-14 LAB — COMPREHENSIVE METABOLIC PANEL
ALT: 12 U/L (ref 0–44)
AST: 18 U/L (ref 15–41)
Albumin: 2.8 g/dL — ABNORMAL LOW (ref 3.5–5.0)
Alkaline Phosphatase: 85 U/L (ref 38–126)
Anion gap: 12 (ref 5–15)
BUN: 10 mg/dL (ref 8–23)
CO2: 26 mmol/L (ref 22–32)
Calcium: 8 mg/dL — ABNORMAL LOW (ref 8.9–10.3)
Chloride: 96 mmol/L — ABNORMAL LOW (ref 98–111)
Creatinine, Ser: 2.95 mg/dL — ABNORMAL HIGH (ref 0.44–1.00)
GFR, Estimated: 15 mL/min — ABNORMAL LOW (ref >60)
Glucose, Bld: 120 mg/dL — ABNORMAL HIGH (ref 70–99)
Potassium: 3.5 mmol/L (ref 3.5–5.1)
Sodium: 134 mmol/L — ABNORMAL LOW (ref 135–145)
Total Bilirubin: 0.8 mg/dL (ref 0.3–1.2)
Total Protein: 5.8 g/dL — ABNORMAL LOW (ref 6.5–8.1)

## 2022-12-14 LAB — CBC WITH DIFFERENTIAL/PLATELET
Abs Immature Granulocytes: 0.03 10*3/uL (ref 0.00–0.07)
Basophils Absolute: 0.1 10*3/uL (ref 0.0–0.1)
Basophils Relative: 1 %
Eosinophils Absolute: 0.1 10*3/uL (ref 0.0–0.5)
Eosinophils Relative: 1 %
HCT: 24 % — ABNORMAL LOW (ref 36.0–46.0)
Hemoglobin: 7.7 g/dL — ABNORMAL LOW (ref 12.0–15.0)
Immature Granulocytes: 0 %
Lymphocytes Relative: 7 %
Lymphs Abs: 0.5 10*3/uL — ABNORMAL LOW (ref 0.7–4.0)
MCH: 35.5 pg — ABNORMAL HIGH (ref 26.0–34.0)
MCHC: 32.1 g/dL (ref 30.0–36.0)
MCV: 110.6 fL — ABNORMAL HIGH (ref 80.0–100.0)
Monocytes Absolute: 0.7 10*3/uL (ref 0.1–1.0)
Monocytes Relative: 9 %
Neutro Abs: 5.6 10*3/uL (ref 1.7–7.7)
Neutrophils Relative %: 82 %
Platelets: 259 10*3/uL (ref 150–400)
RBC: 2.17 MIL/uL — ABNORMAL LOW (ref 3.87–5.11)
RDW: 21.5 % — ABNORMAL HIGH (ref 11.5–15.5)
WBC: 7 10*3/uL (ref 4.0–10.5)
nRBC: 0 % (ref 0.0–0.2)

## 2022-12-14 LAB — TROPONIN I (HIGH SENSITIVITY): Troponin I (High Sensitivity): 66 ng/L — ABNORMAL HIGH (ref <18)

## 2022-12-14 MED ORDER — ACETAMINOPHEN 325 MG PO TABS
650.0000 mg | ORAL_TABLET | Freq: Four times a day (QID) | ORAL | Status: DC | PRN
  Administered 2022-12-16 – 2022-12-23 (×3): 650 mg via ORAL
  Filled 2022-12-14 (×3): qty 2

## 2022-12-14 MED ORDER — ONDANSETRON HCL 4 MG PO TABS
4.0000 mg | ORAL_TABLET | Freq: Four times a day (QID) | ORAL | Status: DC | PRN
  Administered 2022-12-16: 4 mg via ORAL
  Filled 2022-12-14: qty 1

## 2022-12-14 MED ORDER — ONDANSETRON HCL 4 MG/2ML IJ SOLN
4.0000 mg | Freq: Four times a day (QID) | INTRAMUSCULAR | Status: DC | PRN
  Administered 2022-12-16 – 2022-12-20 (×2): 4 mg via INTRAVENOUS
  Filled 2022-12-14 (×2): qty 2

## 2022-12-14 MED ORDER — ACETAMINOPHEN 650 MG RE SUPP
650.0000 mg | Freq: Four times a day (QID) | RECTAL | Status: DC | PRN
  Administered 2022-12-23: 650 mg via RECTAL
  Filled 2022-12-14: qty 1

## 2022-12-14 MED ORDER — AMIODARONE HCL IN DEXTROSE 360-4.14 MG/200ML-% IV SOLN
30.0000 mg/h | INTRAVENOUS | Status: DC
  Administered 2022-12-15 – 2022-12-16 (×3): 30 mg/h via INTRAVENOUS
  Filled 2022-12-14 (×2): qty 200

## 2022-12-14 MED ORDER — AMIODARONE LOAD VIA INFUSION
150.0000 mg | Freq: Once | INTRAVENOUS | Status: AC
  Administered 2022-12-14: 150 mg via INTRAVENOUS
  Filled 2022-12-14: qty 83.34

## 2022-12-14 MED ORDER — AMIODARONE HCL IN DEXTROSE 360-4.14 MG/200ML-% IV SOLN
60.0000 mg/h | INTRAVENOUS | Status: AC
  Administered 2022-12-14: 60 mg/h via INTRAVENOUS
  Filled 2022-12-14 (×2): qty 200

## 2022-12-14 NOTE — ED Notes (Signed)
ED TO INPATIENT HANDOFF REPORT  ED Nurse Name and Phone #:  Jess F   S Name/Age/Gender [name] 82 y.o. female Room/Bed: APA01/APA01  Code Status   Code Status: Prior  Home/SNF/Other Nursing Home Patient oriented to: self, place, time, and situation Is this baseline? Yes   Triage Complete: Triage complete  Chief Complaint Paroxysmal atrial flutter [facility]) [I48.92]  Triage Note Pt BIB [facility] from [facility], staff report pt just got there yesterday, is a new resident. Pt reports chest pain that started after dialysis today. Pt also reports headache. Pt points to abdomen when asked where chest pain is located.    Allergies Allergies  Allergen Reactions   Amlodipine Swelling   Aspirin Other (See Comments)    High Doses Mess up her stomach; "makes my bowels have blood in them". Takes 81 mg EC Aspirin    Nitrofurantoin Hives   Ranexa [Ranolazine] Other (See Comments)    Myoclonus-hospitalized    Bactrim [Sulfamethoxazole-Trimethoprim] Rash   Contrast Media [Iodinated Contrast Media] Itching   Iron Itching and Other (See Comments)    "they gave me iron in dialysis; had to give me Benadryl cause I had to have the iron" (05/02/2012)   Gabapentin Other (See Comments)    Unknown reaction   Iron Sucrose Other (See Comments)    Unknown   Sucroferric Oxyhydroxide Other (See Comments)    Unknown   Levaquin [Levofloxacin In D5w] Rash   Pantoprazole Rash   Plavix [Clopidogrel Bisulfate] Rash   Protonix [Pantoprazole Sodium] Rash   Venofer [Ferric Oxide] Itching and Other (See Comments)    Patient reports using Benadryl prior to doses as [facility]    Level of Care/Admitting Diagnosis ED Disposition     ED Disposition  Admit   Condition  --   Comment  Hospital Area: [facility] [[id number]]  Level of Care: Telemetry Medical [104]  May admit patient to [facility] or [facility] if equivalent level of care is available:: Yes  Covid Evaluation:  Asymptomatic - no recent exposure (last 10 days) testing not required  Diagnosis: Paroxysmal atrial flutter [facility]) [[id number]]  Admitting Physician: Frankey Shown [[id number]]  Attending Physician: Frankey Shown [[id number]]  Certification:: I certify this patient will need inpatient services for at least 2 midnights  Estimated Length of Stay: 3          B Medical/Surgery History Past Medical History:  Diagnosis Date   Acute on chronic respiratory failure with hypoxia (HCC) 10/10/2016   Anxiety    Arthritis    AVM (arteriovenous malformation) of colon    CAD (coronary artery disease)    a. s/p CABG in 2013 b. DES to D1 in 10/2016. c. cath in 07/2018 showing patent grafts with occlusion of D1 at prior stent site and progression of PDA disease --> medical management recommended   Carotid artery disease (HCC)    a. 60-79% LICA, 03/2012    Chronic bronchitis (HCC)    Chronic HFrEF (heart failure with reduced ejection fraction) (HCC)    Colon cancer (HCC) 1992   Esophageal stricture    ESRD on hemodialysis (HCC)    ESRD due to HTN, started dialysis 2011 and gets HD at [facility] with Dr Fausto Skillern on MWF schedule.  Access is LUA AVF as of Sept 2014.    GERD (gastroesophageal reflux disease)    High cholesterol 12/2011   History of blood transfusion 07/2011; 12/2011; 01/2012 X 2; 04/2012   History of gout  History of lower GI bleeding    Hypertension    Iron deficiency anemia    Jugular vein occlusion, right (HCC)    Mitral regurgitation    a. Moderate by echo, 02/2012   Mitral valve disease    NSVT (nonsustained ventricular tachycardia) (HCC)    Ovarian cancer (HCC) 1992   PAF (paroxysmal atrial fibrillation) (HCC)    Pneumonia ~ 2009   PUD (peptic ulcer disease)    TIA (transient ischemic attack)    Tricuspid valve disease    Past Surgical History:  Procedure Laterality Date   A/V FISTULAGRAM Left 10/04/2022   Procedure: A/V Fistulagram;  Surgeon: Renford Dills, MD;   Location: [facility] INVASIVE CV LAB;  Service: Cardiovascular;  Laterality: Left;   A/V SHUNTOGRAM Left 03/19/2019   Procedure: A/V SHUNTOGRAM;  Surgeon: Renford Dills, MD;  Location: [facility] INVASIVE CV LAB;  Service: Cardiovascular;  Laterality: Left;   ABDOMINAL HYSTERECTOMY  1992   APPENDECTOMY  06/1990   AV FISTULA PLACEMENT  07/2009   left upper arm   AV FISTULA PLACEMENT Right 09/06/2016   Procedure: RIGHT FOREARM ARTERIOVENOUS (AV) GRAFT;  Surgeon: Sherren Kerns, MD;  [facility] OR;  Service: Vascular;  Laterality: Right;   AV FISTULA PLACEMENT N/A 02/24/2017   Procedure: INSERTION OF ARTERIOVENOUS (AV) GORE-TEX GRAFT ARM (BRACHIAL AXILLARY);  Surgeon: Renford Dills, MD;  Location: [facility] ORS;  Service: Vascular;  Laterality: N/A;   AVGG REMOVAL Right 09/06/2016   Procedure: REMOVAL OF Right Arm ARTERIOVENOUS GORETEX GRAFT and Vein Patch angioplasty of brachial artery;  Surgeon: Chuck Hint, MD;  Location[facility] OR;  Service: Vascular;  Laterality: Right;   BIOPSY  09/26/2019   Procedure: BIOPSY;  Surgeon: Malissa Hippo, MD;  [facility];  Service: Endoscopy;;   BIOPSY  11/01/2022   Procedure: BIOPSY;  Surgeon: Dolores Frame, MD;  [facility];  Service: Gastroenterology;;   COLON RESECTION  1992   COLON SURGERY     COLONOSCOPY N/A 03/09/2019   Procedure: COLONOSCOPY;  Surgeon: Malissa Hippo, MD;  [facility];  Service: Endoscopy;  Laterality: N/A;   COLONOSCOPY N/A 08/11/2022   Procedure: COLONOSCOPY;  Surgeon: Beverley Fiedler, MD;  Location: [facility] ENDOSCOPY;  Service: Gastroenterology;  Laterality: N/A;   COLONOSCOPY WITH PROPOFOL N/A 06/08/2021   Procedure: COLONOSCOPY WITH PROPOFOL;  Surgeon: Dolores Frame, MD;  [facility];  Service: Gastroenterology;  Laterality: N/A;  9:05 /Patient is on dialysis Mon Wed Fri   CORONARY ANGIOPLASTY WITH STENT PLACEMENT  12/15/11   "2"   CORONARY ANGIOPLASTY WITH STENT  PLACEMENT  y/2013   "1; makes total of 3" (05/02/2012)   CORONARY ARTERY BYPASS GRAFT  06/13/2012   Procedure: CORONARY ARTERY BYPASS GRAFTING (CABG);  Surgeon: Delight Ovens, MD;  Location: [facility] OR;  Service: Open Heart Surgery;  Laterality: N/A;  cabg x four;  using left internal mammary artery, and left leg greater saphenous vein harvested endoscopically   CORONARY STENT INTERVENTION N/A 10/13/2016   Procedure: Coronary Stent Intervention;  Surgeon: Lennette Bihari, MD;  [facility] LAB;  Service: Cardiovascular;  Laterality: N/A;   DIALYSIS/PERMA CATHETER REMOVAL N/A 04/18/2017   Procedure: DIALYSIS/PERMA CATHETER REMOVAL;  Surgeon: Renford Dills, MD;  Location: [facility] INVASIVE CV LAB;  Service: Cardiovascular;  Laterality: N/A;   DILATION AND CURETTAGE OF UTERUS     ENTEROSCOPY N/A 06/08/2021   Procedure: PUSH ENTEROSCOPY;  Surgeon: Marguerita Merles,  Reuel Boom, MD;  [facility];  Service: Gastroenterology;  Laterality: N/A;   ENTEROSCOPY N/A 11/01/2022   Procedure: ENTEROSCOPY;  Surgeon: Dolores Frame, MD;  [facility];  Service: Gastroenterology;  Laterality: N/A;   ENTEROSCOPY N/A 12/01/2022   Procedure: ENTEROSCOPY;  Surgeon: Benancio Deeds, MD;  Location: [facility] ENDOSCOPY;  Service: Gastroenterology;  Laterality: N/A;   ESOPHAGOGASTRODUODENOSCOPY  01/20/2012   Procedure: ESOPHAGOGASTRODUODENOSCOPY (EGD);  Surgeon: Meryl Dare, MD,FACG;  Location: [facility] ENDOSCOPY;  Service: Endoscopy;  Laterality: N/A;   ESOPHAGOGASTRODUODENOSCOPY N/A 03/26/2013   Procedure: ESOPHAGOGASTRODUODENOSCOPY (EGD);  Surgeon: Hilarie Fredrickson, MD;  Location: [facility] ENDOSCOPY;  Service: Endoscopy;  Laterality: N/A;   ESOPHAGOGASTRODUODENOSCOPY N/A 04/30/2015   Procedure: ESOPHAGOGASTRODUODENOSCOPY (EGD);  Surgeon: Malissa Hippo, MD;  [facility];  Service: Endoscopy;  Laterality: N/A;  1pm - moved to 10/20 @ 1:10   ESOPHAGOGASTRODUODENOSCOPY N/A 07/29/2016    Procedure: ESOPHAGOGASTRODUODENOSCOPY (EGD);  Surgeon: Ruffin Frederick, MD;  Location: [facility] ENDOSCOPY;  Service: Gastroenterology;  Laterality: N/A;  enteroscopy   ESOPHAGOGASTRODUODENOSCOPY N/A 09/26/2019   Procedure: ESOPHAGOGASTRODUODENOSCOPY (EGD);  Surgeon: Malissa Hippo, MD;  [facility];  Service: Endoscopy;  Laterality: N/A;  1250   ESOPHAGOGASTRODUODENOSCOPY N/A 08/11/2022   Procedure: ESOPHAGOGASTRODUODENOSCOPY (EGD);  Surgeon: Beverley Fiedler, MD;  Location: [facility] ENDOSCOPY;  Service: Gastroenterology;  Laterality: N/A;   ESOPHAGOGASTRODUODENOSCOPY (EGD) WITH PROPOFOL N/A 02/05/2021   Procedure: ESOPHAGOGASTRODUODENOSCOPY (EGD) WITH PROPOFOL;  Surgeon: Lanelle Bal, DO;  [facility];  Service: Endoscopy;  Laterality: N/A;   ESOPHAGOGASTRODUODENOSCOPY (EGD) WITH PROPOFOL N/A 08/27/2022   Procedure: ESOPHAGOGASTRODUODENOSCOPY (EGD) WITH PROPOFOL;  Surgeon: Lanelle Bal, DO;  [facility];  Service: Endoscopy;  Laterality: N/A;   GIVENS CAPSULE STUDY N/A 03/07/2019   Procedure: GIVENS CAPSULE STUDY;  Surgeon: Malissa Hippo, MD;  [facility];  Service: Endoscopy;  Laterality: N/A;  7:30   GIVENS CAPSULE STUDY N/A 04/22/2021   Procedure: GIVENS CAPSULE STUDY;  Surgeon: Malissa Hippo, MD;  [facility];  Service: Endoscopy;  Laterality: N/A;  7:30   GIVENS CAPSULE STUDY N/A 08/27/2022   Procedure: GIVENS CAPSULE STUDY;  Surgeon: Lanelle Bal, DO;  [facility];  Service: Endoscopy;  Laterality: N/A;   HOT HEMOSTASIS N/A 08/11/2022   Procedure: HOT HEMOSTASIS (ARGON PLASMA COAGULATION/BICAP);  Surgeon: Beverley Fiedler, MD;  Location: [facility] ENDOSCOPY;  Service: Gastroenterology;  Laterality: N/A;   HOT HEMOSTASIS N/A 12/01/2022   Procedure: HOT HEMOSTASIS (ARGON PLASMA COAGULATION/BICAP);  Surgeon: Benancio Deeds, MD;  Location: [facility] ENDOSCOPY;  Service: Gastroenterology;  Laterality: N/A;   INSERTION OF DIALYSIS  CATHETER N/A 10/05/2020   Procedure: ABORTED TUNNELED DIALYSIS CATHETER PLACEMENT RIGHT INTERNAL JUGULAR VEIN ;  Surgeon: Lucretia Roers, MD;  Location: [facility] ORS;  Service: General;  Laterality: N/A;   INTRAOPERATIVE TRANSESOPHAGEAL ECHOCARDIOGRAM  06/13/2012   Procedure: INTRAOPERATIVE TRANSESOPHAGEAL ECHOCARDIOGRAM;  Surgeon: Delight Ovens, MD;  Location: [facility] OR;  Service: Open Heart Surgery;  Laterality: N/A;   IR DIALY SHUNT INTRO NEEDLE/INTRACATH INITIAL W/IMG LEFT Left 10/06/2020   IR FLUORO GUIDE CV LINE RIGHT  06/17/2020   IR FLUORO GUIDE CV LINE RIGHT  11/12/2022   IR GENERIC HISTORICAL  07/26/2016   IR FLUORO GUIDE CV LINE RIGHT 07/26/2016 Berdine Dance, MD [facility]   IR GENERIC HISTORICAL  07/26/2016   IR US GUIDE VASC ACCESS RIGHT 07/26/2016 Berdine Dance, MD [facility]   IR [facility]  HISTORICAL  08/02/2016   IR US GUIDE VASC ACCESS RIGHT 08/02/2016 Berdine Dance, MD [facility]   IR GENERIC HISTORICAL  08/02/2016   IR FLUORO GUIDE CV LINE RIGHT 08/02/2016 Berdine Dance, MD [facility]   IR RADIOLOGY PERIPHERAL GUIDED IV START  03/28/2017   IR REMOVAL TUN CV CATH W/O FL  08/11/2020   IR REMOVAL TUN CV CATH W/O FL  11/15/2022   IR THROMBECTOMY AV FISTULA W/THROMBOLYSIS INC/SHUNT/IMG LEFT Left 06/17/2020   IR US GUIDE VASC ACCESS LEFT  06/17/2020   IR US GUIDE VASC ACCESS RIGHT  03/28/2017   IR US GUIDE VASC ACCESS RIGHT  06/17/2020   IR US GUIDE VASC ACCESS RIGHT  11/12/2022   LEFT HEART CATH AND CORONARY ANGIOGRAPHY N/A 09/20/2016   Procedure: Left Heart Cath and Coronary Angiography;  Surgeon: Lyn Records, MD;  Location: [facility] INVASIVE CV LAB;  Service: Cardiovascular;  Laterality: N/A;   LEFT HEART CATH AND CORS/GRAFTS ANGIOGRAPHY N/A 10/13/2016   Procedure: Left Heart Cath and Cors/Grafts Angiography;  Surgeon: Lennette Bihari, MD;  [facility] CV LAB;  Service: Cardiovascular;  Laterality: N/A;   LEFT HEART CATH AND CORS/GRAFTS ANGIOGRAPHY N/A 07/13/2018   Procedure: LEFT HEART  CATH AND CORS/GRAFTS ANGIOGRAPHY;  Surgeon: Swaziland, Peter M, MD;  Location: [facility] INVASIVE CV LAB;  Service: Cardiovascular;  Laterality: N/A;   LEFT HEART CATH AND CORS/GRAFTS ANGIOGRAPHY N/A 07/22/2021   Procedure: LEFT HEART CATH AND CORS/GRAFTS ANGIOGRAPHY;  Surgeon: Runell Gess, MD;  [facility] INVASIVE CV LAB;  Service: Cardiovascular;  Laterality: N/A;   LEFT HEART CATHETERIZATION WITH CORONARY ANGIOGRAM N/A 12/15/2011   Procedure: LEFT HEART CATHETERIZATION WITH CORONARY ANGIOGRAM;  Surgeon: Kathleene Hazel, MD;  Location: [facility] CATH LAB;  Service: Cardiovascular;  Laterality: N/A;   LEFT HEART CATHETERIZATION WITH CORONARY ANGIOGRAM N/A 01/10/2012   Procedure: LEFT HEART CATHETERIZATION WITH CORONARY ANGIOGRAM;  Surgeon: Peter M Swaziland, MD;  Location: [facility] CATH LAB;  Service: Cardiovascular;  Laterality: N/A;   LEFT HEART CATHETERIZATION WITH CORONARY ANGIOGRAM N/A 06/08/2012   Procedure: LEFT HEART CATHETERIZATION WITH CORONARY ANGIOGRAM;  Surgeon: Kathleene Hazel, MD;  Location: [facility] CATH LAB;  Service: Cardiovascular;  Laterality: N/A;   LEFT HEART CATHETERIZATION WITH CORONARY/GRAFT ANGIOGRAM N/A 12/10/2013   Procedure: LEFT HEART CATHETERIZATION WITH Isabel Caprice;  Surgeon: Corky Crafts, MD;  Location: [facility] CATH LAB;  Service: Cardiovascular;  Laterality: N/A;   OVARY SURGERY     ovarian cancer   POLYPECTOMY  03/09/2019   Procedure: POLYPECTOMY;  Surgeon: Malissa Hippo, MD;  [facility];  Service: Endoscopy;;  cecal    POLYPECTOMY N/A 09/26/2019   Procedure: DUODENAL POLYPECTOMY;  Surgeon: Malissa Hippo, MD;  [facility];  Service: Endoscopy;  Laterality: N/A;   POLYPECTOMY  08/11/2022   Procedure: POLYPECTOMY;  Surgeon: Beverley Fiedler, MD;  Location: [facility] ENDOSCOPY;  Service: Gastroenterology;;   REVISION OF ARTERIOVENOUS GORETEX GRAFT N/A 02/24/2017   Procedure: REVISION OF ARTERIOVENOUS GORETEX GRAFT (RESECTION);  Surgeon: Renford Dills, MD;  Location: [facility] ORS;  Service: Vascular;  Laterality: N/A;   REVISON OF ARTERIOVENOUS FISTULA Left 06/19/2020   Procedure: REVISION OF LEFT UPPER ARM AV GRAFT WITH INTERPOSITION JUMP GRAFT USING GORE LIMB;  Surgeon: Cephus Shelling, MD;  Location: [facility] OR;  Service: Vascular;  Laterality: Left;   SHUNTOGRAM N/A 10/15/2013   Procedure: Fistulogram;  Surgeon: Nada Libman, MD;  Location: [facility] CATH LAB;  Service: Cardiovascular;  Laterality: N/A;   SUBMUCOSAL TATTOO INJECTION  11/01/2022   Procedure: SUBMUCOSAL TATTOO INJECTION;  Surgeon: Dolores Frame, MD;  [facility];  Service: Gastroenterology;;   THROMBECTOMY / ARTERIOVENOUS GRAFT REVISION  2011   left upper arm   TUBAL LIGATION  1980's   UPPER EXTREMITY ANGIOGRAPHY Bilateral 12/06/2016   Procedure: Upper Extremity Angiography;  Surgeon: Renford Dills, MD;  Location: [facility] INVASIVE CV LAB;  Service: Cardiovascular;  Laterality: Bilateral;   UPPER EXTREMITY INTERVENTION Left 06/06/2017   Procedure: UPPER EXTREMITY INTERVENTION;  Surgeon: Renford Dills, MD;  Location: [facility] INVASIVE CV LAB;  Service: Cardiovascular;  Laterality: Left;   UPPER EXTREMITY VENOGRAPHY Left 10/04/2022   Procedure: UPPER EXTREMITY VENOGRAPHY;  Surgeon: Renford Dills, MD;  Location: [facility] INVASIVE CV LAB;  Service: Cardiovascular;  Laterality: Left;     A IV Location/Drains/Wounds Patient Lines/Drains/Airways Status     Active Line/Drains/Airways     Name Placement date Placement time Site Days   Peripheral IV 12/14/22 20 G Anterior;Right External jugular 12/14/22  2035  External jugular  less than 1   Fistula / Graft Left Upper arm Arteriovenous fistula 03/19/19  --  Upper arm  1366   Hemodialysis Catheter Left Subclavian Double lumen Temporary (Non-Tunneled) 06/09/22  --  Subclavian  188   Pressure Injury 11/30/22 Rectum Stage 2 -  Partial thickness loss of dermis presenting as a shallow open injury with a  red, pink wound bed without slough. open shallow ulcer with pink wound bed 11/30/22  0640  -- 14   Wound / Incision (Open or Dehisced) 11/14/21 Arm Left;Posterior;Proximal;Upper left proximal axilla large blackhead 11/14/21  1736  Arm  395   Wound / Incision (Open or Dehisced) 11/30/22 Other (Comment) Coccyx Mid open area to coccyx 11/30/22  0215  Coccyx  14   Wound / Incision (Open or Dehisced) 11/30/22 Other (Comment) fissure in coccyx area 11/30/22  --  --  14            Intake/Output Last 24 hours No intake or output data in the 24 hours ending 12/14/22 2048  Labs/Imaging Results for orders placed or performed during the hospital encounter of 12/14/22 (from the past 48 hour(s))  CBC with Differential/Platelet     Status: Abnormal   Collection Time: 12/14/22  6:57 PM  Result Value Ref Range   WBC 7.0 4.0 - 10.5 K/uL   RBC 2.17 (L) 3.87 - 5.11 MIL/uL   Hemoglobin 7.7 (L) 12.0 - 15.0 g/dL   HCT 11.9 (L) 14.7 - 82.9 %   MCV 110.6 (H) 80.0 - 100.0 fL   MCH 35.5 (H) 26.0 - 34.0 pg   MCHC 32.1 30.0 - 36.0 g/dL   RDW 56.2 (H) 13.0 - 86.5 %   Platelets 259 150 - 400 K/uL    Comment: REPEATED TO VERIFY   nRBC 0.0 0.0 - 0.2 %   Neutrophils Relative % 82 %   Neutro Abs 5.6 1.7 - 7.7 K/uL   Lymphocytes Relative 7 %   Lymphs Abs 0.5 (L) 0.7 - 4.0 K/uL   Monocytes Relative 9 %   Monocytes Absolute 0.7 0.1 - 1.0 K/uL   Eosinophils Relative 1 %   Eosinophils Absolute 0.1 0.0 - 0.5 K/uL   Basophils Relative 1 %   Basophils Absolute 0.1 0.0 - 0.1 K/uL   Smear Review MORPHOLOGY UNREMARKABLE    Immature Granulocytes 0 %   Abs Immature Granulocytes 0.03 0.00 - 0.07 K/uL  Reactive, Benign Lymphocytes PRESENT    Schistocytes PRESENT    Tear Drop Cells PRESENT     Comment: Performed at [facility], [address], [place], [place] [address]  Comprehensive metabolic panel     Status: Abnormal   Collection Time: 12/14/22  6:57 PM  Result Value Ref Range   Sodium 134 (L) 135 - 145 mmol/L    Potassium 3.5 3.5 - 5.1 mmol/L   Chloride 96 (L) 98 - 111 mmol/L   CO2 26 22 - 32 mmol/L   Glucose, Bld 120 (H) 70 - 99 mg/dL    Comment: Glucose reference range applies only to samples taken after fasting for at least 8 hours.   BUN 10 8 - 23 mg/dL   Creatinine, Ser 0.98 (H) 0.44 - 1.00 mg/dL   Calcium 8.0 (L) 8.9 - 10.3 mg/dL   Total Protein 5.8 (L) 6.5 - 8.1 g/dL   Albumin 2.8 (L) 3.5 - 5.0 g/dL   AST 18 15 - 41 U/L   ALT 12 0 - 44 U/L   Alkaline Phosphatase 85 38 - 126 U/L   Total Bilirubin 0.8 0.3 - 1.2 mg/dL   GFR, Estimated 15 (L) >60 mL/min    Comment: (NOTE) Calculated using the CKD-EPI Creatinine Equation (2021)    Anion gap 12 5 - 15    Comment: Performed at [facility], [address], [place], [place] [address]   *Note: Due to a large number of results and/or encounters for the requested time period, some results have not been displayed. A complete set of results can be found in Results Review.   DG Chest Port 1 View  Result Date: 12/14/2022 CLINICAL DATA:  Altered mental status tachycardia EXAM: PORTABLE CHEST 1 VIEW COMPARISON:  Previous studies including the examination of 12/04/2022 FINDINGS: Transverse diameter of heart is increased. There are no signs of alveolar pulmonary edema. Small bilateral pleural effusions are noted, more so on the left side with interval increase. Linear densities are seen in left lower lung field. There is no pneumothorax. Tip of left IJ dialysis catheter is seen in the region of junction of superior vena cava and right atrium. Vascular stents are noted in the left upper arm. Degenerative changes are noted in right shoulder. There is previous coronary bypass surgery. IMPRESSION: Small bilateral pleural effusions, more so on the left side with interval increase. Cardiomegaly. There are no signs of alveolar pulmonary edema. Linear densities in the left lower lung field may suggest subsegmental atelectasis. Electronically Signed   By: Ernie Avena M.D.   On: 12/14/2022 18:28    Pending Labs Unresulted Labs (From admission, onward)    None       Vitals/Pain Today's Vitals   12/14/22 1740 12/14/22 1926 12/14/22 2015 12/14/22 2030  BP:   (!) 147/82 103/69  Pulse: (!) 147  (!) 139 (!) 138  Resp: 17  (!) 23 (!) 23  Temp:      TempSrc:      SpO2: 97%  96% 97%  Weight:      Height:      PainSc:  0-No pain      Isolation Precautions No active isolations  Medications Medications  amiodarone (NEXTERONE) 1.8 mg/mL load via infusion 150 mg (150 mg Intravenous Bolus from Bag 12/14/22 2037)    Followed by  amiodarone (NEXTERONE PREMIX) 360-4.14 MG/200ML-% (1.8 mg/mL) IV infusion (60 mg/hr Intravenous New Bag/Given 12/14/22 2036)    Followed by  amiodarone (NEXTERONE PREMIX) 360-4.14 MG/200ML-% (  1.8 mg/mL) IV infusion (has no administration in time range)    Mobility non-ambulatory     Focused Assessments Cardiac Assessment Handoff:  Cardiac Rhythm: Sinus tachycardia Lab Results  Component Value Date   CKTOTAL 47 12/03/2020   CKMB 1.5 05/19/2016   TROPONINI 0.37 (HH) 12/26/2018   Lab Results  Component Value Date   DDIMER 0.46 02/07/2021   Does the Patient currently have chest pain? No    R Recommendations: See Admitting Provider Note  Report given to:   Additional Notes: a fib RVR on amio

## 2022-12-14 NOTE — ED Notes (Signed)
Report given to 3E and carelink

## 2022-12-14 NOTE — ED Triage Notes (Addendum)
Pt BIB [facility] from [facility], staff report pt just got there yesterday, is a new resident. Pt reports chest pain that started after dialysis today. Pt also reports headache. Pt points to abdomen when asked where chest pain is located.

## 2022-12-14 NOTE — ED Notes (Signed)
Attempted IV start unsuccessful 

## 2022-12-14 NOTE — Telephone Encounter (Signed)
Hi Shawna [name],  Thank you for the update. I know they were working on the free drug. I will let them know she is out of hospital and see when we should expect free drug. I will follow up with you by end of day.  Milon Dikes Account Executive Cell [phone] [email or web address]  December 14, 2022

## 2022-12-14 NOTE — ED Provider Notes (Signed)
[facility] AT [facility] Provider Note   CSN: [id number] Arrival date & time: 12/14/22  1721     History  Chief Complaint  Patient presents with   Chest Pain    Shawna [name] is a 82 y.o. female.  Patient just discharged from [facility] patient was admitted May 22 to June 4.  Patient originally was admitted here and then transferred down there.  Patient had a GI bleed complicated by an AVM of the small bowel.  Patient cannot be anticoagulated because of that.  Patient is end-stage renal disease on hemodialysis normally dialyzed Monday Wednesdays and Fridays.  Patient was discharged to the [facility] facility and was dialyzed some today patient was a little confused about that she developed a rapid heart rate and is why they sent her over.  During her last hospitalization she had hypertension she had proximal atrial fibrillation which was difficult to control she is on amiodarone and she is on Coreg for that.  Patient had difficulties while in the hospital that when she was dialyzed her heart would go fast.  It is a wide-complex tachycardia cardiology thinks today's EKG probably looks like atrial flutter but it could be atrial fibs.  Patient's blood pressure is actually little elevated here today 146/84 heart rate was in the 140s temp 98.7.  They sent her in for the rapid heart rate altered mental status but patient's husband is here and patient is talking fine now patient seems to be baseline for mental status.  The main part problems been the rapid heart rate.  I's discussed with cardiology on-call at [facility] who knew her well.  They recommended starting amiodarone bolus and drip.  And to watch her blood pressures.  We struggled with getting an IV but eventually got IV established.       Home Medications Prior to Admission medications   Medication Sig Start Date End Date Taking? Authorizing Provider  amiodarone (PACERONE) 200 MG tablet Take 200 mg by mouth daily. 11/21/22   Yes [provider]  ALPRAZolam Prudy Feeler) 0.5 MG tablet Take 1 tablet (0.5 mg total) by mouth daily as needed for anxiety. 12/13/22   Jerald Kief, MD  amiodarone (PACERONE) 100 MG tablet Take 1 tablet (100 mg total) by mouth daily. 04/27/22   Rhetta Mura, MD  atorvastatin (LIPITOR) 10 MG tablet Take 1 tablet by mouth at bedtime.    [provider]  benzonatate (TESSALON) 100 MG capsule Take 100 mg by mouth 3 (three) times daily as needed for cough. 05/10/22   [provider]  carvedilol (COREG) 6.25 MG tablet Take 1 tablet (6.25 mg total) by mouth 2 (two) times daily with a meal. 12/06/22 01/05/23  Arrien, York Ram, MD  cyclobenzaprine (FLEXERIL) 5 MG tablet Take 5 mg by mouth 3 (three) times daily as needed.    [provider]  folic acid (FOLVITE) 1 MG tablet Take 1 mg by mouth daily. 05/24/22   [provider]  gabapentin (NEURONTIN) 100 MG capsule Take 1 capsule (100 mg total) by mouth every evening. 12/13/22   Jerald Kief, MD  guaiFENesin-codeine 100-10 MG/5ML syrup Take 5 mLs by mouth every 4 (four) hours as needed for cough. 10/23/22   Jacalyn Lefevre, MD  hydrALAZINE (APRESOLINE) 50 MG tablet Take 1 tablet (50 mg total) by mouth every 8 (eight) hours. 12/13/22 01/12/23  Jerald Kief, MD  hydrOXYzine (ATARAX) 25 MG tablet Take 25 mg by mouth every 6 (six) hours  as needed for itching. 06/06/22   [provider]  levETIRAcetam (KEPPRA) 500 MG tablet Take 1 tablet (500 mg total) by mouth daily. 11/15/22   Leroy Sea, MD  levothyroxine (SYNTHROID) 75 MCG tablet Take 1 tablet (75 mcg total) by mouth daily before breakfast. 08/14/22 12/02/22  Hughie Closs, MD  multivitamin (RENA-VIT) TABS tablet Take 1 tablet by mouth daily.    [provider]  nitroGLYCERIN (NITROSTAT) 0.4 MG SL tablet Place 0.4 mg under the tongue every 5 (five) minutes as needed for chest pain.    [provider]  octreotide (SANDOSTATIN LAR)  20 MG injection Inject 20 mg into the muscle every 28 (twenty-eight) days. 11/15/22   Leroy Sea, MD  omeprazole (PRILOSEC) 40 MG capsule Take 1 capsule (40 mg total) by mouth daily. 10/06/22   Aida Raider, NP  ondansetron (ZOFRAN-ODT) 4 MG disintegrating tablet Take 4 mg by mouth every 8 (eight) hours as needed for nausea or vomiting. 05/05/22   [provider]  rosuvastatin (CRESTOR) 10 MG tablet Take 1 tablet by mouth once daily 06/06/22   [place] Ouch, [place] M, PA-C  sevelamer carbonate (RENVELA) 800 MG tablet Take 3,200 mg by mouth in the morning and at bedtime. 12/27/21   [provider]      Allergies    Amlodipine, Aspirin, Nitrofurantoin, Ranexa [ranolazine], Bactrim [sulfamethoxazole-trimethoprim], Contrast media [iodinated contrast media], Iron, Gabapentin, Iron sucrose, Sucroferric oxyhydroxide, Levaquin [levofloxacin in d5w], Pantoprazole, Plavix [clopidogrel bisulfate], Protonix [pantoprazole sodium], and Venofer [ferric oxide]    Review of Systems   Review of Systems  Constitutional:  Negative for chills and fever.  HENT:  Negative for ear pain and sore throat.   Eyes:  Negative for pain and visual disturbance.  Respiratory:  Negative for cough and shortness of breath.   Cardiovascular:  Positive for palpitations. Negative for chest pain.  Gastrointestinal:  Negative for abdominal pain and vomiting.  Genitourinary:  Negative for dysuria and hematuria.  Musculoskeletal:  Negative for arthralgias and back pain.  Skin:  Negative for color change and rash.  Neurological:  Negative for seizures and syncope.  All other systems reviewed and are negative.   Physical Exam Updated Vital Signs BP (!) 146/84   Pulse (!) 147   Temp 98.7 F (37.1 C) (Oral)   Resp 17   Ht 1.575 m (5\' 2" )   Wt 56.9 kg   SpO2 97%   BMI 22.94 kg/m  Physical Exam Vitals and nursing note reviewed.  Constitutional:      General: She is not in acute distress.    Appearance:  She is well-developed. She is obese.  HENT:     Head: Normocephalic and atraumatic.  Eyes:     Extraocular Movements: Extraocular movements intact.     Conjunctiva/sclera: Conjunctivae normal.     Pupils: Pupils are equal, round, and reactive to light.  Cardiovascular:     Rate and Rhythm: Regular rhythm. Tachycardia present.     Heart sounds: No murmur heard. Pulmonary:     Effort: Pulmonary effort is normal. No respiratory distress.     Breath sounds: Normal breath sounds. No wheezing or rales.     Comments: Dialysis catheter left subclavian area.  Patient with old AV fistula left upper extremity. Abdominal:     Palpations: Abdomen is soft.     Tenderness: There is no abdominal tenderness.  Musculoskeletal:        General: No swelling.     Cervical back: Neck supple.  Right lower leg: No edema.     Left lower leg: No edema.  Skin:    General: Skin is warm and dry.     Capillary Refill: Capillary refill takes less than 2 seconds.  Neurological:     Mental Status: She is alert.  Psychiatric:        Mood and Affect: Mood normal.     ED Results / Procedures / Treatments   Labs (all labs ordered are listed, but only abnormal results are displayed) Labs Reviewed  CBC WITH DIFFERENTIAL/PLATELET - Abnormal; Notable for the following components:      Result Value   RBC 2.17 (*)    Hemoglobin 7.7 (*)    HCT 24.0 (*)    MCV 110.6 (*)    MCH 35.5 (*)    RDW 21.5 (*)    Lymphs Abs 0.5 (*)    All other components within normal limits  COMPREHENSIVE METABOLIC PANEL - Abnormal; Notable for the following components:   Sodium 134 (*)    Chloride 96 (*)    Glucose, Bld 120 (*)    Creatinine, Ser 2.95 (*)    Calcium 8.0 (*)    Total Protein 5.8 (*)    Albumin 2.8 (*)    GFR, Estimated 15 (*)    All other components within normal limits    EKG EKG Interpretation  Date/Time:  Wednesday December 14 2022 17:37:51 EDT Ventricular Rate:  146 PR Interval:    QRS  Duration: 135 QT Interval:  359 QTC Calculation: 560 R Axis:   -36 Text Interpretation: Wide-QRS tachycardia Left bundle branch block Confirmed by Vanetta Mulders [phone]) on 12/14/2022 5:43:59 PM  Radiology DG Chest Port 1 View  Result Date: 12/14/2022 CLINICAL DATA:  Altered mental status tachycardia EXAM: PORTABLE CHEST 1 VIEW COMPARISON:  Previous studies including the examination of 12/04/2022 FINDINGS: Transverse diameter of heart is increased. There are no signs of alveolar pulmonary edema. Small bilateral pleural effusions are noted, more so on the left side with interval increase. Linear densities are seen in left lower lung field. There is no pneumothorax. Tip of left IJ dialysis catheter is seen in the region of junction of superior vena cava and right atrium. Vascular stents are noted in the left upper arm. Degenerative changes are noted in right shoulder. There is previous coronary bypass surgery. IMPRESSION: Small bilateral pleural effusions, more so on the left side with interval increase. Cardiomegaly. There are no signs of alveolar pulmonary edema. Linear densities in the left lower lung field may suggest subsegmental atelectasis. Electronically Signed   By: Ernie Avena M.D.   On: 12/14/2022 18:28    Procedures Procedures    Medications Ordered in ED Medications  amiodarone (NEXTERONE) 1.8 mg/mL load via infusion 150 mg (has no administration in time range)    Followed by  amiodarone (NEXTERONE PREMIX) 360-4.14 MG/200ML-% (1.8 mg/mL) IV infusion (has no administration in time range)    Followed by  amiodarone (NEXTERONE PREMIX) 360-4.14 MG/200ML-% (1.8 mg/mL) IV infusion (has no administration in time range)    ED Course/ Medical Decision Making/ A&P                             Medical Decision Making Amount and/or Complexity of Data Reviewed Labs: ordered. Radiology: ordered.  Risk Prescription drug management. Decision regarding  hospitalization.   CRITICAL CARE Performed by: Vanetta Mulders Total critical care time: 60 minutes Critical care time was  exclusive of separately billable procedures and treating other patients. Critical care was necessary to treat or prevent imminent or life-threatening deterioration. Critical care was time spent personally by me on the following activities: development of treatment plan with patient and/or surrogate as well as nursing, discussions with consultants, evaluation of patient's response to treatment, examination of patient, obtaining history from patient or surrogate, ordering and performing treatments and interventions, ordering and review of laboratory studies, ordering and review of radiographic studies, pulse oximetry and re-evaluation of patient's condition.  See HPI for details.  Long discussion with cardiology at [facility].  We are able to establish an IV will start the MRI and bolus and drip.  Patient's chest x-ray with some small pleural effusions.  Patient's labs here today look like she probably was dialyzed.  Hemoglobin is actually good at 7.7 she was discharged yesterday at 7.4.  Platelets are 259.  Complete metabolic panel GFR 15 but potassium 3.5 and her creatinine is 2.95 better than it was yesterday so it certainly goes along with her being dialyzed today.  Cardiac monitor consistent with atrial fib or flutter looks like a wide-complex tachycardia rate around 140.  Discussed with hospitalist here they will admit her to the hospital service at [facility] cardiology will consult they are aware of her.  Patient with some mild chest discomfort but it could be rate related.   Final Clinical Impression(s) / ED Diagnoses Final diagnoses:  Wide-complex tachycardia  Atrial flutter with rapid ventricular response (HCC)  End stage renal disease on dialysis [facility])    Rx / DC Orders ED Discharge Orders     None         Vanetta Mulders, MD 12/14/22 2009

## 2022-12-14 NOTE — H&P (Signed)
History and Physical    Patient: Shawna [name]:[id number] DOB: [birth year] DOA: 12/14/2022 DOS: the patient was seen and examined on 12/14/2022 PCP: [facility]  Patient coming from: SNF  Chief Complaint:  Chief Complaint  Patient presents with   Chest Pain   HPI: Shawna [name] is a 82 y.o. female with medical history significant of hypertension, hyperlipidemia, CAD s/p CABG and PCI, paroxysmal atrial fibrillation no longer on oral anticoagulation due to history of recurrent GI bleed, ESRD on HD (MWF), seizure disorder, hypothyroidism, history of small bowel AVMs who presents to the emergency department via EMS from [facility] due to chest pain.  Patient states that chest pain started shortly after returning from dialysis today, she states that she was at rest when the pain started, it was left-sided chest pain, nonradiating, sharp in nature, 10/10 on pain scale and was associated with palpitations. Patient was recently admitted to [facility] from 5/22 to 6/4 due to AVM (arteriovenous malformation) of small bowel, acquired with hemorrhage with which patient received 1 unit PRBC transfusion with good toleration. Small bowel enteroscopy with jejunal diverticulum showed six non bleeding angiodysplastic lesions in the proximal jejunum treated with argon plasma coagulation.  She was continued with antiacid and outpatient octreotide injection with follow-up with Dr. Levon Hedger as outpatient from GI. During this hospitalization, she had paroxysmal atrial fibrilation which was difficult to control and she was placed on amiodarone and Coreg.   ED Course:  In the emergency department, she was tachycardic, BP was 140/84, other vital signs were within normal range.  Workup in the ED showed macrocytic anemia and iron deficiency anemia.  BMP showed sodium 134, potassium 3.5, chloride 96, bicarb 26, blood glucose 120, BUN 10, creatinine 2.95, albumin 2.8 Chest x-ray showed small bilateral pleural  effusions,  more so on the left side with interval increase. Cardiomegaly. There are no signs of alveolar pulmonary edema. Linear densities in the left lower lung field may suggest subsegmental atelectasis.  Cardiologist at [facility] was consulted and recommended admitting patient to [facility] and to start patient on IV amiodarone drip with plan for cardiology team to consult on patient on arrival to [facility]. Hospitalist was asked admit patient for further evaluation and management.  Review of Systems: Review of systems as noted in the HPI. All other systems reviewed and are negative.   Past Medical History:  Diagnosis Date   Acute on chronic respiratory failure with hypoxia (HCC) 10/10/2016   Anxiety    Arthritis    AVM (arteriovenous malformation) of colon    CAD (coronary artery disease)    a. s/p CABG in 2013 b. DES to D1 in 10/2016. c. cath in 07/2018 showing patent grafts with occlusion of D1 at prior stent site and progression of PDA disease --> medical management recommended   Carotid artery disease (HCC)    a. 60-79% LICA, 03/2012    Chronic bronchitis (HCC)    Chronic HFrEF (heart failure with reduced ejection fraction) (HCC)    Colon cancer (HCC) 1992   Esophageal stricture    ESRD on hemodialysis (HCC)    ESRD due to HTN, started dialysis 2011 and gets HD at [facility] with Dr Fausto Skillern on MWF schedule.  Access is LUA AVF as of Sept 2014.    GERD (gastroesophageal reflux disease)    High cholesterol 12/2011   History of blood transfusion 07/2011; 12/2011; 01/2012 X 2; 04/2012   History of gout    History of lower  GI bleeding    Hypertension    Iron deficiency anemia    Jugular vein occlusion, right (HCC)    Mitral regurgitation    a. Moderate by echo, 02/2012   Mitral valve disease    NSVT (nonsustained ventricular tachycardia) (HCC)    Ovarian cancer (HCC) 1992   PAF (paroxysmal atrial fibrillation) (HCC)    Pneumonia ~ 2009   PUD (peptic ulcer disease)    TIA (transient  ischemic attack)    Tricuspid valve disease    Past Surgical History:  Procedure Laterality Date   A/V FISTULAGRAM Left 10/04/2022   Procedure: A/V Fistulagram;  Surgeon: Renford Dills, MD;  Location: [facility] INVASIVE CV LAB;  Service: Cardiovascular;  Laterality: Left;   A/V SHUNTOGRAM Left 03/19/2019   Procedure: A/V SHUNTOGRAM;  Surgeon: Renford Dills, MD;  Location: [facility] INVASIVE CV LAB;  Service: Cardiovascular;  Laterality: Left;   ABDOMINAL HYSTERECTOMY  1992   APPENDECTOMY  06/1990   AV FISTULA PLACEMENT  07/2009   left upper arm   AV FISTULA PLACEMENT Right 09/06/2016   Procedure: RIGHT FOREARM ARTERIOVENOUS (AV) GRAFT;  Surgeon: Sherren Kerns, MD;  [facility]: [facility] OR;  Service: Vascular;  Laterality: Right;   AV FISTULA PLACEMENT N/A 02/24/2017   Procedure: INSERTION OF ARTERIOVENOUS (AV) GORE-TEX GRAFT ARM (BRACHIAL AXILLARY);  Surgeon: Renford Dills, MD;  Location: [facility] ORS;  Service: Vascular;  Laterality: N/A;   AVGG REMOVAL Right 09/06/2016   Procedure: REMOVAL OF Right Arm ARTERIOVENOUS GORETEX GRAFT and Vein Patch angioplasty of brachial artery;  Surgeon: Chuck Hint, MD;  Location: [facility] OR;  Service: Vascular;  Laterality: Right;   BIOPSY  09/26/2019   Procedure: BIOPSY;  Surgeon: Malissa Hippo, MD;  [facility];  Service: Endoscopy;;   BIOPSY  11/01/2022   Procedure: BIOPSY;  Surgeon: Dolores Frame, MD;  [facility];  Service: Gastroenterology;;   COLON RESECTION  1992   COLON SURGERY     COLONOSCOPY N/A 03/09/2019   Procedure: COLONOSCOPY;  Surgeon: Malissa Hippo, MD;  [facility];  Service: Endoscopy;  Laterality: N/A;   COLONOSCOPY N/A 08/11/2022   Procedure: COLONOSCOPY;  Surgeon: Beverley Fiedler, MD;  Location: [facility] ENDOSCOPY;  Service: Gastroenterology;  Laterality: N/A;   COLONOSCOPY WITH PROPOFOL N/A 06/08/2021   Procedure: COLONOSCOPY WITH PROPOFOL;  Surgeon: Dolores Frame, MD;  [facility];  Service: Gastroenterology;  Laterality: N/A;  9:05 /Patient is on dialysis Mon Wed Fri   CORONARY ANGIOPLASTY WITH STENT PLACEMENT  12/15/11   "2"   CORONARY ANGIOPLASTY WITH STENT PLACEMENT  y/2013   "1; makes total of 3" (05/02/2012)   CORONARY ARTERY BYPASS GRAFT  06/13/2012   Procedure: CORONARY ARTERY BYPASS GRAFTING (CABG);  Surgeon: Delight Ovens, MD;  Location: [facility] OR;  Service: Open Heart Surgery;  Laterality: N/A;  cabg x four;  using left internal mammary artery, and left leg greater saphenous vein harvested endoscopically   CORONARY STENT INTERVENTION N/A 10/13/2016   Procedure: Coronary Stent Intervention;  Surgeon: Lennette Bihari, MD;  [facility] INV[facility] CV LAB;  Service: Cardiovascular;  Laterality: N/A;   DIALYSIS/PERMA CATHETER REMOVAL N/A 04/18/2017   Procedure: DIALYSIS/PERMA CATHETER REMOVAL;  Surgeon: Renford Dills, MD;  Location: [facility] INVASIVE CV LAB;  Service: Cardiovascular;  Laterality: N/A;   DILATION AND CURETTAGE OF UTERUS     ENTEROSCOPY N/A 06/08/2021   Procedure: PUSH ENTEROSCOPY;  Surgeon: Dolores Frame, MD;  [facility];  Service: Gastroenterology;  Laterality: N/A;   ENTEROSCOPY N/A 11/01/2022   Procedure: ENTEROSCOPY;  Surgeon: Dolores Frame, MD;  [facility];  Service: Gastroenterology;  Laterality: N/A;   ENTEROSCOPY N/A 12/01/2022   Procedure: ENTEROSCOPY;  Surgeon: Benancio Deeds, MD;  Location: [facility] ENDOSCOPY;  Service: Gastroenterology;  Laterality: N/A;   ESOPHAGOGASTRODUODENOSCOPY  01/20/2012   Procedure: ESOPHAGOGASTRODUODENOSCOPY (EGD);  Surgeon: Meryl Dare, MD,FACG;  Location: [facility] ENDOSCOPY;  Service: Endoscopy;  Laterality: N/A;   ESOPHAGOGASTRODUODENOSCOPY N/A 03/26/2013   Procedure: ESOPHAGOGASTRODUODENOSCOPY (EGD);  Surgeon: Hilarie Fredrickson, MD;  Location: [facility] ENDOSCOPY;  Service: Endoscopy;  Laterality: N/A;   ESOPHAGOGASTRODUODENOSCOPY N/A 04/30/2015   Procedure:  ESOPHAGOGASTRODUODENOSCOPY (EGD);  Surgeon: Malissa Hippo, MD;  [facility];  Service: Endoscopy;  Laterality: N/A;  1pm - moved to 10/20 @ 1:10   ESOPHAGOGASTRODUODENOSCOPY N/A 07/29/2016   Procedure: ESOPHAGOGASTRODUODENOSCOPY (EGD);  Surgeon: Ruffin Frederick, MD;  Location: [facility] ENDOSCOPY;  Service: Gastroenterology;  Laterality: N/A;  enteroscopy   ESOPHAGOGASTRODUODENOSCOPY N/A 09/26/2019   Procedure: ESOPHAGOGASTRODUODENOSCOPY (EGD);  Surgeon: Malissa Hippo, MD;  [facility];  Service: Endoscopy;  Laterality: N/A;  1250   ESOPHAGOGASTRODUODENOSCOPY N/A 08/11/2022   Procedure: ESOPHAGOGASTRODUODENOSCOPY (EGD);  Surgeon: Beverley Fiedler, MD;  Location: [facility] ENDOSCOPY;  Service: Gastroenterology;  Laterality: N/A;   ESOPHAGOGASTRODUODENOSCOPY (EGD) WITH PROPOFOL N/A 02/05/2021   Procedure: ESOPHAGOGASTRODUODENOSCOPY (EGD) WITH PROPOFOL;  Surgeon: Lanelle Bal, DO;  [facility];  Service: Endoscopy;  Laterality: N/A;   ESOPHAGOGASTRODUODENOSCOPY (EGD) WITH PROPOFOL N/A 08/27/2022   Procedure: ESOPHAGOGASTRODUODENOSCOPY (EGD) WITH PROPOFOL;  Surgeon: Lanelle Bal, DO;  [facility];  Service: Endoscopy;  Laterality: N/A;   GIVENS CAPSULE STUDY N/A 03/07/2019   Procedure: GIVENS CAPSULE STUDY;  Surgeon: Malissa Hippo, MD;  [facility];  Service: Endoscopy;  Laterality: N/A;  7:30   GIVENS CAPSULE STUDY N/A 04/22/2021   Procedure: GIVENS CAPSULE STUDY;  Surgeon: Malissa Hippo, MD;  [facility];  Service: Endoscopy;  Laterality: N/A;  7:30   GIVENS CAPSULE STUDY N/A 08/27/2022   Procedure: GIVENS CAPSULE STUDY;  Surgeon: Lanelle Bal, DO;  [facility];  Service: Endoscopy;  Laterality: N/A;   HOT HEMOSTASIS N/A 08/11/2022   Procedure: HOT HEMOSTASIS (ARGON PLASMA COAGULATION/BICAP);  Surgeon: Beverley Fiedler, MD;  Location: [facility] ENDOSCOPY;  Service: Gastroenterology;  Laterality: N/A;   HOT HEMOSTASIS N/A  12/01/2022   Procedure: HOT HEMOSTASIS (ARGON PLASMA COAGULATION/BICAP);  Surgeon: Benancio Deeds, MD;  Location: [facility] ENDOSCOPY;  Service: Gastroenterology;  Laterality: N/A;   INSERTION OF DIALYSIS CATHETER N/A 10/05/2020   Procedure: ABORTED TUNNELED DIALYSIS CATHETER PLACEMENT RIGHT INTERNAL JUGULAR VEIN ;  Surgeon: Lucretia Roers, MD;  Location: [facility] ORS;  Service: General;  Laterality: N/A;   INTRAOPERATIVE TRANSESOPHAGEAL ECHOCARDIOGRAM  06/13/2012   Procedure: INTRAOPERATIVE TRANSESOPHAGEAL ECHOCARDIOGRAM;  Surgeon: Delight Ovens, MD;  Location: [facility] OR;  Service: Open Heart Surgery;  Laterality: N/A;   IR DIALY SHUNT INTRO NEEDLE/INTRACATH INITIAL W/IMG LEFT Left 10/06/2020   IR FLUORO GUIDE CV LINE RIGHT  06/17/2020   IR FLUORO GUIDE CV LINE RIGHT  11/12/2022   IR GENERIC HISTORICAL  07/26/2016   IR FLUORO GUIDE CV LINE RIGHT 07/26/2016 Berdine Dance, MD [facility]   IR GENERIC HISTORICAL  07/26/2016   IR US GUIDE VASC ACCESS RIGHT 07/26/2016 Berdine Dance, MD [facility]   IR GENERIC HISTORICAL  08/02/2016  IR US GUIDE VASC ACCESS RIGHT 08/02/2016 Berdine Dance, MD [facility]   IR GENERIC HISTORICAL  08/02/2016   IR FLUORO GUIDE CV LINE RIGHT 08/02/2016 Berdine Dance, MD [facility]   IR RADIOLOGY PERIPHERAL GUIDED IV START  03/28/2017   IR REMOVAL TUN CV CATH W/O FL  08/11/2020   IR REMOVAL TUN CV CATH W/O FL  11/15/2022   IR THROMBECTOMY AV FISTULA W/THROMBOLYSIS INC/SHUNT/IMG LEFT Left 06/17/2020   IR US GUIDE VASC ACCESS LEFT  06/17/2020   IR US GUIDE VASC ACCESS RIGHT  03/28/2017   IR US GUIDE VASC ACCESS RIGHT  06/17/2020   IR US GUIDE VASC ACCESS RIGHT  11/12/2022   LEFT HEART CATH AND CORONARY ANGIOGRAPHY N/A 09/20/2016   Procedure: Left Heart Cath and Coronary Angiography;  Surgeon: Lyn Records, MD;  Location: [facility] INVASIVE CV LAB;  Service: Cardiovascular;  Laterality: N/A;   LEFT HEART CATH AND CORS/GRAFTS ANGIOGRAPHY N/A 10/13/2016   Procedure: Left Heart Cath and Cors/Grafts  Angiography;  Surgeon: Lennette Bihari, MD;  Location: [facility] INVAS[facility] CV LAB;  Service: Cardiovascular;  Laterality: N/A;   LEFT HEART CATH AND CORS/GRAFTS ANGIOGRAPHY N/A 07/13/2018   Procedure: LEFT HEART CATH AND CORS/GRAFTS ANGIOGRAPHY;  Surgeon: Swaziland, Peter M, MD;  Location: [facility] INVASIVE CV LAB;  Service: Cardiovascular;  Laterality: N/A;   LEFT HEART CATH AND CORS/GRAFTS ANGIOGRAPHY N/A 07/22/2021   Procedure: LEFT HEART CATH AND CORS/GRAFTS ANGIOGRAPHY;  Surgeon: Runell Gess, MD;  Location: [facility] INVASIVE CV LAB;  Service: Cardiovascular;  Laterality: N/A;   LEFT HEART CATHETERIZATION WITH CORONARY ANGIOGRAM N/A 12/15/2011   Procedure: LEFT HEART CATHETERIZATION WITH CORONARY ANGIOGRAM;  Surgeon: Kathleene Hazel, MD;  Location: [facility] CATH LAB;  Service: Cardiovascular;  Laterality: N/A;   LEFT HEART CATHETERIZATION WITH CORONARY ANGIOGRAM N/A 01/10/2012   Procedure: LEFT HEART CATHETERIZATION WITH CORONARY ANGIOGRAM;  Surgeon: Peter M Swaziland, MD;  Location: [facility] CATH LAB;  Service: Cardiovascular;  Laterality: N/A;   LEFT HEART CATHETERIZATION WITH CORONARY ANGIOGRAM N/A 06/08/2012   Procedure: LEFT HEART CATHETERIZATION WITH CORONARY ANGIOGRAM;  Surgeon: Kathleene Hazel, MD;  Location: [facility] CATH LAB;  Service: Cardiovascular;  Laterality: N/A;   LEFT HEART CATHETERIZATION WITH CORONARY/GRAFT ANGIOGRAM N/A 12/10/2013   Procedure: LEFT HEART CATHETERIZATION WITH Isabel Caprice;  Surgeon: Corky Crafts, MD;  Location: [facility] CATH LAB;  Service: Cardiovascular;  Laterality: N/A;   OVARY SURGERY     ovarian cancer   POLYPECTOMY  03/09/2019   Procedure: POLYPECTOMY;  Surgeon: Malissa Hippo, MD;  [facility];  Service: Endoscopy;;  cecal    POLYPECTOMY N/A 09/26/2019   Procedure: DUODENAL POLYPECTOMY;  Surgeon: Malissa Hippo, MD;  [facility];  Service: Endoscopy;  Laterality: N/A;   POLYPECTOMY  08/11/2022   Procedure: POLYPECTOMY;  Surgeon: Beverley Fiedler,  MD;  Location: [facility] ENDOSCOPY;  Service: Gastroenterology;;   REVISION OF ARTERIOVENOUS GORETEX GRAFT N/A 02/24/2017   Procedure: REVISION OF ARTERIOVENOUS GORETEX GRAFT (RESECTION);  Surgeon: Renford Dills, MD;  Location: [facility] ORS;  Service: Vascular;  Laterality: N/A;   REVISON OF ARTERIOVENOUS FISTULA Left 06/19/2020   Procedure: REVISION OF LEFT UPPER ARM AV GRAFT WITH INTERPOSITION JUMP GRAFT USING GORE LIMB;  Surgeon: Cephus Shelling, MD;  Location: [facility] OR;  Service: Vascular;  Laterality: Left;   SHUNTOGRAM N/A 10/15/2013   Procedure: Fistulogram;  Surgeon: Nada Libman, MD;  Location: [facility] CATH LAB;  Service: Cardiovascular;  Laterality: N/A;   SUBMUCOSAL  TATTOO INJECTION  11/01/2022   Procedure: SUBMUCOSAL TATTOO INJECTION;  Surgeon: Dolores Frame, MD;  [facility];  Service: Gastroenterology;;   THROMBECTOMY / ARTERIOVENOUS GRAFT REVISION  2011   left upper arm   TUBAL LIGATION  1980's   UPPER EXTREMITY ANGIOGRAPHY Bilateral 12/06/2016   Procedure: Upper Extremity Angiography;  Surgeon: Renford Dills, MD;  Location: [facility] INVASIVE CV LAB;  Service: Cardiovascular;  Laterality: Bilateral;   UPPER EXTREMITY INTERVENTION Left 06/06/2017   Procedure: UPPER EXTREMITY INTERVENTION;  Surgeon: Renford Dills, MD;  Location: [facility] INVASIVE CV LAB;  Service: Cardiovascular;  Laterality: Left;   UPPER EXTREMITY VENOGRAPHY Left 10/04/2022   Procedure: UPPER EXTREMITY VENOGRAPHY;  Surgeon: Renford Dills, MD;  Location: [facility] INVASIVE CV LAB;  Service: Cardiovascular;  Laterality: Left;    Social History:  reports that she has never smoked. She has never used smokeless tobacco. She reports that she does not drink alcohol and does not use drugs.   Allergies  Allergen Reactions   Amlodipine Swelling   Aspirin Other (See Comments)    High Doses Mess up her stomach; "makes my bowels have blood in them". Takes 81 mg EC Aspirin    Nitrofurantoin Hives    Ranexa [Ranolazine] Other (See Comments)    Myoclonus-hospitalized    Bactrim [Sulfamethoxazole-Trimethoprim] Rash   Contrast Media [Iodinated Contrast Media] Itching   Iron Itching and Other (See Comments)    "they gave me iron in dialysis; had to give me Benadryl cause I had to have the iron" (05/02/2012)   Gabapentin Other (See Comments)    Unknown reaction   Iron Sucrose Other (See Comments)    Unknown   Sucroferric Oxyhydroxide Other (See Comments)    Unknown   Levaquin [Levofloxacin In D5w] Rash   Pantoprazole Rash   Plavix [Clopidogrel Bisulfate] Rash   Protonix [Pantoprazole Sodium] Rash   Venofer [Ferric Oxide] Itching and Other (See Comments)    Patient reports using Benadryl prior to doses as [facility]    Family History  Problem Relation Age of Onset   Heart disease Mother        Heart Disease before age 88   Hyperlipidemia Mother    Hypertension Mother    Diabetes Mother    Heart attack Mother    Heart disease Father        Heart Disease before age 82   Hyperlipidemia Father    Hypertension Father    Diabetes Father    Diabetes Sister    Hypertension Sister    Diabetes Brother    Hyperlipidemia Brother    Heart attack Brother    Hypertension Sister    Heart attack Brother    Colon cancer Child 65   Other Other        noncontributory for early CAD   Esophageal cancer Neg Hx    Liver disease Neg Hx    Kidney disease Neg Hx    Colon polyps Neg Hx      Prior to Admission medications   Medication Sig Start Date End Date Taking? Authorizing Provider  amiodarone (PACERONE) 200 MG tablet Take 200 mg by mouth daily. 11/21/22  Yes [provider]  ALPRAZolam Prudy Feeler) 0.5 MG tablet Take 1 tablet (0.5 mg total) by mouth daily as needed for anxiety. 12/13/22   Jerald Kief, MD  amiodarone (PACERONE) 100 MG tablet Take 1 tablet (100 mg total) by mouth daily. 04/27/22   Rhetta Mura, MD  atorvastatin (LIPITOR)  10 MG tablet Take 1 tablet by mouth  at bedtime.    [provider]  benzonatate (TESSALON) 100 MG capsule Take 100 mg by mouth 3 (three) times daily as needed for cough. 05/10/22   [provider]  carvedilol (COREG) 6.25 MG tablet Take 1 tablet (6.25 mg total) by mouth 2 (two) times daily with a meal. 12/06/22 01/05/23  Arrien, York Ram, MD  cyclobenzaprine (FLEXERIL) 5 MG tablet Take 5 mg by mouth 3 (three) times daily as needed.    [provider]  folic acid (FOLVITE) 1 MG tablet Take 1 mg by mouth daily. 05/24/22   [provider]  gabapentin (NEURONTIN) 100 MG capsule Take 1 capsule (100 mg total) by mouth every evening. 12/13/22   Jerald Kief, MD  guaiFENesin-codeine 100-10 MG/5ML syrup Take 5 mLs by mouth every 4 (four) hours as needed for cough. 10/23/22   Jacalyn Lefevre, MD  hydrALAZINE (APRESOLINE) 50 MG tablet Take 1 tablet (50 mg total) by mouth every 8 (eight) hours. 12/13/22 01/12/23  Jerald Kief, MD  hydrOXYzine (ATARAX) 25 MG tablet Take 25 mg by mouth every 6 (six) hours as needed for itching. 06/06/22   [provider]  levETIRAcetam (KEPPRA) 500 MG tablet Take 1 tablet (500 mg total) by mouth daily. 11/15/22   Leroy Sea, MD  levothyroxine (SYNTHROID) 75 MCG tablet Take 1 tablet (75 mcg total) by mouth daily before breakfast. 08/14/22 12/02/22  Hughie Closs, MD  multivitamin (RENA-VIT) TABS tablet Take 1 tablet by mouth daily.    [provider]  nitroGLYCERIN (NITROSTAT) 0.4 MG SL tablet Place 0.4 mg under the tongue every 5 (five) minutes as needed for chest pain.    [provider]  octreotide (SANDOSTATIN LAR) 20 MG injection Inject 20 mg into the muscle every 28 (twenty-eight) days. 11/15/22   Leroy Sea, MD  omeprazole (PRILOSEC) 40 MG capsule Take 1 capsule (40 mg total) by mouth daily. 10/06/22   Aida Raider, NP  ondansetron (ZOFRAN-ODT) 4 MG disintegrating tablet Take 4 mg by mouth every 8 (eight) hours as needed for nausea or  vomiting. 05/05/22   [provider]  rosuvastatin (CRESTOR) 10 MG tablet Take 1 tablet by mouth once daily 06/06/22   Iran Ouch, [place] M, PA-C  sevelamer carbonate (RENVELA) 800 MG tablet Take 3,200 mg by mouth in the morning and at bedtime. 12/27/21   [provider]    Physical Exam: BP (!) 173/67   Pulse 71   Temp 98.7 F (37.1 C) (Oral)   Resp 20   Ht 5\' 2"  (1.575 m)   Wt 56.9 kg   SpO2 94%   BMI 22.94 kg/m   General: 82 y.o. year-old female well developed well nourished in no acute distress.  Alert and oriented x3. HEENT: NCAT, EOMI Neck: Supple, trachea medial Cardiovascular: Tachycardia, irregularly irregular rate and rhythm with no rubs or gallops.  No thyromegaly or JVD noted.  No lower extremity edema. 2/4 pulses in all 4 extremities.  Dialysis catheter in the subcu area and old AV fistula in left upper extremity   Respiratory: Clear to auscultation with no wheezes or rales. Good inspiratory effort. Abdomen: Soft, nontender nondistended with normal bowel sounds x4 quadrants. Muskuloskeletal: No cyanosis, clubbing or edema noted bilaterally Neuro: CN II-XII intact, strength 5/5 x 4, sensation, reflexes intact Skin: No ulcerative lesions noted or rashes Psychiatry: Judgement and insight appear normal. Mood is appropriate for condition and setting  Labs on Admission:  Basic Metabolic Panel: Recent Labs  Lab 12/09/22 0206 12/11/22 0231 12/12/22 1404 12/13/22 0337 12/14/22 1857  NA 131* 130* 131* 132* 134*  K 4.8 4.7 4.6 3.9 3.5  CL 94* 93* 92* 94* 96*  CO2 27 23 27 25 26   GLUCOSE 98 88 106* 112* 120*  BUN 30* 28* 40* 14 10  CREATININE 6.11* 5.28* 7.59* 4.18* 2.95*  CALCIUM 8.6* 8.3* 8.7* 8.2* 8.0*  MG 2.7*  --   --   --   --   PHOS 3.7  --   --   --   --    Liver Function Tests: Recent Labs  Lab 12/09/22 0206 12/11/22 0231 12/12/22 1404 12/13/22 0337 12/14/22 1857  AST  --  18 13* 15 18  ALT  --  9 10 10 12   ALKPHOS  --  60  77 73 85  BILITOT  --  0.8 0.6 0.7 0.8  PROT  --  4.7* 5.0* 5.1* 5.8*  ALBUMIN 2.3* 2.3* 2.4* 2.5* 2.8*   No results for input(s): "LIPASE", "AMYLASE" in the last 168 hours. No results for input(s): "AMMONIA" in the last 168 hours. CBC: Recent Labs  Lab 12/08/22 1340 12/09/22 0206 12/12/22 1401 12/13/22 0337 12/14/22 1857  WBC 6.3 5.9 5.5 5.0 7.0  NEUTROABS 4.9 4.2  --  3.8 5.6  HGB 7.3* 7.3* 7.1* 7.4* 7.7*  HCT 23.5* 22.8* 22.2* 22.7* 24.0*  MCV 109.8* 109.6* 112.1* 108.6* 110.6*  PLT 214 229 234 210 259   Cardiac Enzymes: No results for input(s): "CKTOTAL", "CKMB", "CKMBINDEX", "TROPONINI" in the last 168 hours.  BNP (last 3 results) Recent Labs    08/06/22 0139 08/26/22 1410 10/23/22 1420  BNP 1,666.2* 1,590.0* 2,205.0*    ProBNP (last 3 results) No results for input(s): "PROBNP" in the last 8760 hours.  CBG: Recent Labs  Lab 12/08/22 1149  GLUCAP 92    Radiological Exams on Admission: DG Chest Port 1 View  Result Date: 12/14/2022 CLINICAL DATA:  Altered mental status tachycardia EXAM: PORTABLE CHEST 1 VIEW COMPARISON:  Previous studies including the examination of 12/04/2022 FINDINGS: Transverse diameter of heart is increased. There are no signs of alveolar pulmonary edema. Small bilateral pleural effusions are noted, more so on the left side with interval increase. Linear densities are seen in left lower lung field. There is no pneumothorax. Tip of left IJ dialysis catheter is seen in the region of junction of superior vena cava and right atrium. Vascular stents are noted in the left upper arm. Degenerative changes are noted in right shoulder. There is previous coronary bypass surgery. IMPRESSION: Small bilateral pleural effusions, more so on the left side with interval increase. Cardiomegaly. There are no signs of alveolar pulmonary edema. Linear densities in the left lower lung field may suggest subsegmental atelectasis. Electronically Signed   By: Ernie Avena M.D.   On: 12/14/2022 18:28    EKG: I independently viewed the EKG done and my findings are as followed: Wide QRS tachycardia with QTc 560 ms  Assessment/Plan Present on Admission:  Paroxysmal atrial flutter (HCC)  Wide-complex tachycardia  Essential hypertension  Mixed hyperlipidemia  Acquired hypothyroidism  Macrocytic anemia  Principal Problem:   Paroxysmal atrial flutter (HCC) Active Problems:   Macrocytic anemia   End-stage renal disease on hemodialysis (HCC)   Essential hypertension   Diabetes mellitus type 2 in nonobese [facility])   Acquired hypothyroidism   Mixed hyperlipidemia   Wide-complex tachycardia  Paroxysmal atrial flutter Wide-complex  tachycardia Patient was started on IV amiodarone per cardiologist recommendation as reported by EDP Patient was [facility] per cardiologist recommendation Please notify cardiology team when patient arrives to [facility]  Atypical chest pain Patient's chest pain appears to be related to palpitations Troponin will be checked Chest pain improved since HR is better controlled with amiodarone Continue management as described above   ESRD on HD (MWF) Last dialysis was today per patient Nephrology will be consulted for maintenance dialysis while at [facility]  Macrocytic Anemia MCV 110.6 Folate and vitamin B12 levels will be checked  Essential hypertension Continue Coreg and hydralazine  T2DM in nonobese Hemoglobin A1c on 07/30/2022 was 5.5 Hemoglobin A1c will be checked  Acquired hypothyroidism Continue levothyroxine  Mixed hyperlipidemia Continue statin  DVT prophylaxis: SCDs   Advance Care Planning: Full code  Consults: Cardiology (by [facility] EDP), nephrology  Family Communication: Husband at bedside (all questions answered to satisfaction)  Severity of Illness: The appropriate patient status for this patient is INPATIENT. Inpatient status is judged to be reasonable and necessary in order to provide  the required intensity of service to ensure the patient's safety. The patient's presenting symptoms, physical exam findings, and initial radiographic and laboratory data in the context of their chronic comorbidities is felt to place them at high risk for further clinical deterioration. Furthermore, it is not anticipated that the patient will be medically stable for discharge from the hospital within 2 midnights of admission.   * I certify that at the point of admission it is my clinical judgment that the patient will require inpatient hospital care spanning beyond 2 midnights from the point of admission due to high intensity of service, high risk for further deterioration and high frequency of surveillance required.*  Author: Frankey Shown, DO 12/14/2022 10:42 PM  For on call review [email or web address].

## 2022-12-14 NOTE — Telephone Encounter (Signed)
Patient released from hospital on 12/13/2022. I have sent Milon Dikes a message asked that he please follow up on the status of the patient assistance for Shawna [name].

## 2022-12-15 ENCOUNTER — Other Ambulatory Visit (HOSPITAL_COMMUNITY): Payer: Self-pay

## 2022-12-15 DIAGNOSIS — I4719 Other supraventricular tachycardia: Secondary | ICD-10-CM | POA: Diagnosis not present

## 2022-12-15 DIAGNOSIS — I4892 Unspecified atrial flutter: Secondary | ICD-10-CM | POA: Diagnosis not present

## 2022-12-15 LAB — HEPATITIS B SURFACE ANTIGEN: Hepatitis B Surface Ag: NONREACTIVE

## 2022-12-15 MED ORDER — CALCITRIOL 0.5 MCG PO CAPS
1.7500 ug | ORAL_CAPSULE | ORAL | Status: DC
  Administered 2022-12-16 – 2022-12-28 (×5): 1.75 ug via ORAL
  Filled 2022-12-15 (×5): qty 1

## 2022-12-15 MED ORDER — CHLORHEXIDINE GLUCONATE CLOTH 2 % EX PADS
6.0000 | MEDICATED_PAD | Freq: Every day | CUTANEOUS | Status: DC
  Administered 2022-12-16 – 2022-12-20 (×5): 6 via TOPICAL

## 2022-12-15 MED ORDER — METOPROLOL TARTRATE 25 MG PO TABS
25.0000 mg | ORAL_TABLET | Freq: Two times a day (BID) | ORAL | Status: DC
  Administered 2022-12-15 – 2022-12-19 (×7): 25 mg via ORAL
  Filled 2022-12-15 (×7): qty 1

## 2022-12-15 MED ORDER — GUAIFENESIN-DM 100-10 MG/5ML PO SYRP
5.0000 mL | ORAL_SOLUTION | ORAL | Status: DC | PRN
  Administered 2022-12-15 – 2022-12-27 (×10): 5 mL via ORAL
  Filled 2022-12-15 (×10): qty 5

## 2022-12-15 MED ORDER — CINACALCET HCL 30 MG PO TABS
30.0000 mg | ORAL_TABLET | ORAL | Status: DC
  Administered 2022-12-16 – 2022-12-28 (×5): 30 mg via ORAL
  Filled 2022-12-15 (×5): qty 1

## 2022-12-15 NOTE — Progress Notes (Signed)
Pt refused lab draw for stat troponin.

## 2022-12-15 NOTE — Progress Notes (Signed)
Pt had skin assessment done with Raven as second verifier.  Pt has two denuded areas on the buttocks but no wounds.

## 2022-12-15 NOTE — Progress Notes (Signed)
PROGRESS NOTE    Shawna [name]  WUJ:[id number] DOB: 12/27/1940 DOA: 12/14/2022 PCP: [facility]   Brief Narrative: 82 year old with past medical history significant for hypertension, hyperlipidemia, CAD status post CABG and PCI, paroxysmal A-fib, no longer on anticoagulation due to recurrent GI bleed, ESRD on hemodialysis (MWF), seizure disorder, hypothyroidism, history of pulmonary.  Presented to the ED complaining of chest pain while she was returning from dialysis the day of admission.  Reports chest pain sharp in nature, 10 out of 10 associated with palpitation.  Patient admission to [facility]/due to AVM, received blood transfusion and was treated with argon plasma hospitalization.  Developed paroxysmal A-fib which was difficult to control she was placed on amiodarone and Coreg.     Assessment & Plan:   Principal Problem:   Paroxysmal atrial flutter (HCC) Active Problems:   Macrocytic anemia   End-stage renal disease on hemodialysis (HCC)   Essential hypertension   Diabetes mellitus type 2 in nonobese (HCC)   Acquired hypothyroidism   Mixed hyperlipidemia   Wide-complex tachycardia   1-Atrial Tachycardia:  -Patient was started on Amiodarone Gtt.  -Not on anticoagulation due to history of GI bleed. AVM.  -Cardiology has been consulted.  Started on metoprolol.   Atypical chest pain Mild elevation of troponin in setting of tachycardia.  Chest pain free this am.  Cardiology following   ESRD on hemodialysis Had HD 6/05. Nephrology consulted.   Microcytic anemia Check Folic acid and B 12.  Monitor hb. 7.7 History of GI bleed.   Essential hypertension On BB  Holding Hydralazine.   Mixed hyperlipidemia On statins.   Hypothyroidism; on synthroid.      Pressure Injury 11/30/22 Rectum Stage 2 -  Partial thickness loss of dermis presenting as a shallow open injury with a red, pink wound bed without slough. open shallow ulcer with pink wound bed (Active)   11/30/22 0640  Location: Rectum  Location Orientation:   Staging: Stage 2 -  Partial thickness loss of dermis presenting as a shallow open injury with a red, pink wound bed without slough.  Wound Description (Comments): open shallow ulcer with pink wound bed  Present on Admission:   Dressing Type Foam - Lift dressing to assess site every shift 12/13/22 0900                  Estimated body mass index is 24.19 kg/m as calculated from the following:   Height as of this encounter: 5\' 2"  (1.575 m).   Weight as of this encounter: 60 kg.   DVT prophylaxis: SCD Code Status: Full code Family Communication: Care discussed with patient.  Disposition Plan:  Status is: Inpatient Remains inpatient appropriate because: management of Tachycardia.     Consultants:  Cardiology Nephrology   Procedures:  None  Antimicrobials:    Subjective: She is alert, conversant. Feels better than yesterday. She denies chest pain this am.   Objective: Vitals:   12/14/22 2250 12/15/22 0420 12/15/22 0600 12/15/22 0743  BP: (!) 172/97 (!) 159/84 (!) 160/79 (!) 151/84  Pulse: 74 (!) 122 (!) 120 (!) 127  Resp: 16 17 20 14   Temp: 98.6 F (37 C) 98.1 F (36.7 C)  98.7 F (37.1 C)  TempSrc: Oral Oral  Oral  SpO2: 100% 100% 100% 99%  Weight: 60 kg     Height: 5\' 2"  (1.575 m)       Intake/Output Summary (Last 24 hours) at 12/15/2022 0810 Last data filed at 12/15/2022 0804 Gross per  24 hour  Intake --  Output 0 ml  Net 0 ml   Filed Weights   12/14/22 1730 12/14/22 2250  Weight: 56.9 kg 60 kg    Examination:  General exam: Appears calm and comfortable  Respiratory system: Clear to auscultation. Respiratory effort normal. Cardiovascular system: S1 & S2 heard, RRR.  Gastrointestinal system: Abdomen is nondistended, soft and nontender. No organomegaly or masses felt. Normal bowel sounds heard. Central nervous system: Alert and oriented.  Extremities: Symmetric 5 x 5 power.    Data  Reviewed: I have personally reviewed following labs and imaging studies  CBC: Recent Labs  Lab 12/08/22 1340 12/09/22 0206 12/12/22 1401 12/13/22 0337 12/14/22 1857  WBC 6.3 5.9 5.5 5.0 7.0  NEUTROABS 4.9 4.2  --  3.8 5.6  HGB 7.3* 7.3* 7.1* 7.4* 7.7*  HCT 23.5* 22.8* 22.2* 22.7* 24.0*  MCV 109.8* 109.6* 112.1* 108.6* 110.6*  PLT 214 229 234 210 259   Basic Metabolic Panel: Recent Labs  Lab 12/09/22 0206 12/11/22 0231 12/12/22 1404 12/13/22 0337 12/14/22 1857  NA 131* 130* 131* 132* 134*  K 4.8 4.7 4.6 3.9 3.5  CL 94* 93* 92* 94* 96*  CO2 27 23 27 25 26   GLUCOSE 98 88 106* 112* 120*  BUN 30* 28* 40* 14 10  CREATININE 6.11* 5.28* 7.59* 4.18* 2.95*  CALCIUM 8.6* 8.3* 8.7* 8.2* 8.0*  MG 2.7*  --   --   --   --   PHOS 3.7  --   --   --   --    GFR: Estimated Creatinine Clearance: 11.6 mL/min (A) (by C-G formula based on SCr of 2.95 mg/dL (H)). Liver Function Tests: Recent Labs  Lab 12/09/22 0206 12/11/22 0231 12/12/22 1404 12/13/22 0337 12/14/22 1857  AST  --  18 13* 15 18  ALT  --  9 10 10 12   ALKPHOS  --  60 77 73 85  BILITOT  --  0.8 0.6 0.7 0.8  PROT  --  4.7* 5.0* 5.1* 5.8*  ALBUMIN 2.3* 2.3* 2.4* 2.5* 2.8*   No results for input(s): "LIPASE", "AMYLASE" in the last 168 hours. No results for input(s): "AMMONIA" in the last 168 hours. Coagulation Profile: No results for input(s): "INR", "PROTIME" in the last 168 hours. Cardiac Enzymes: No results for input(s): "CKTOTAL", "CKMB", "CKMBINDEX", "TROPONINI" in the last 168 hours. BNP (last 3 results) No results for input(s): "PROBNP" in the last 8760 hours. HbA1C: No results for input(s): "HGBA1C" in the last 72 hours. CBG: Recent Labs  Lab 12/08/22 1149  GLUCAP 92   Lipid Profile: No results for input(s): "CHOL", "HDL", "LDLCALC", "TRIG", "CHOLHDL", "LDLDIRECT" in the last 72 hours. Thyroid Function Tests: No results for input(s): "TSH", "T4TOTAL", "FREET4", "T3FREE", "THYROIDAB" in the last 72  hours. Anemia Panel: No results for input(s): "VITAMINB12", "FOLATE", "FERRITIN", "TIBC", "IRON", "RETICCTPCT" in the last 72 hours. Sepsis Labs: No results for input(s): "PROCALCITON", "LATICACIDVEN" in the last 168 hours.  Recent Results (from the past 240 hour(s))  MRSA Next Gen by PCR, Nasal     Status: None   Collection Time: 12/05/22  1:37 PM   Specimen: Nasal Mucosa; Nasal Swab  Result Value Ref Range Status   MRSA by PCR Next Gen NOT DETECTED NOT DETECTED Final    Comment: (NOTE) The GeneXpert MRSA Assay (FDA approved for NASAL specimens only), is one component of a comprehensive MRSA colonization surveillance program. It is not intended to diagnose MRSA infection nor to guide or monitor  treatment for MRSA infections. Test performance is not FDA approved in patients less than 60 years old. Performed at [facility], [address], [place], [place] [address]          Radiology Studies: DG Chest Port 1 View  Result Date: 12/14/2022 CLINICAL DATA:  Altered mental status tachycardia EXAM: PORTABLE CHEST 1 VIEW COMPARISON:  Previous studies including the examination of 12/04/2022 FINDINGS: Transverse diameter of heart is increased. There are no signs of alveolar pulmonary edema. Small bilateral pleural effusions are noted, more so on the left side with interval increase. Linear densities are seen in left lower lung field. There is no pneumothorax. Tip of left IJ dialysis catheter is seen in the region of junction of superior vena cava and right atrium. Vascular stents are noted in the left upper arm. Degenerative changes are noted in right shoulder. There is previous coronary bypass surgery. IMPRESSION: Small bilateral pleural effusions, more so on the left side with interval increase. Cardiomegaly. There are no signs of alveolar pulmonary edema. Linear densities in the left lower lung field may suggest subsegmental atelectasis. Electronically Signed   By: Ernie Avena  M.D.   On: 12/14/2022 18:28        Scheduled Meds: Continuous Infusions:  amiodarone 30 mg/hr (12/15/22 0043)     LOS: 1 day    Time spent: 35 minutes    Rylei Codispoti A Jahzara Slattery, MD [facility]   If 7PM-7AM, please contact night-coverage [email or web address]  12/15/2022, 8:10 AM

## 2022-12-15 NOTE — Progress Notes (Signed)
Pt refused lab blood draw. 

## 2022-12-15 NOTE — Consult Note (Addendum)
Cardiology Consultation   Patient ID: Shawna [name] MRN: [id number]; DOB: 1940-07-14  Admit date: 12/14/2022 Date of Consult: 12/15/2022  PCP:  [facility]   [facility] Providers Cardiologist:  Dina Rich, MD   {   Patient Profile:   Shawna [name] is a 82 y.o. female with a hx of  CAD (s/p CABG in 2013, DES to D1 in 10/2016, cath in 07/2018 and 07/2021 managed medically), HTN, HLD, PAF/flutter (not on anticoagulation given history of GIB), chronic anemia, NSVT (treated with amiodarone), ESRD on HD, chronic HFrEF, mitral regurgitation and stenosis/tricuspid regurgitation, LBBB, colon CA, esophageal stricture, carotid artery disease (<40% by duplex 2016), PUD, TIA, seizures, hypothyroidism, recurrent GI bleeding due to small bowel AVM,  who is being seen 12/15/2022 for the evaluation of wide-complex tachycardia at the request of Dr. Sunnie Nielsen.  History of Present Illness:   Chart review: Pt was seen by our office in January 2024. Patient does have history of PAF/flutter and not deemed a candidate for anticoagulation given GI bleeds requiring multiple blood transfusions.  Also would not be a good candidate for watchman's device given end-stage renal disease and inability to tolerate blood thinners.  She had been maintaining normal sinus rhythm on amiodarone and metoprolol.  She has known coronary artery disease with history as above and generally has been stable without any anginal complaints.  In January 2023 she had a decline of LVEF 35% by echo and elevated troponin's with cath showing no real difference from 2020 results so medical therapy was recommended and thought to be related to demand ischemia.  Jan 2023 showing EF of 30 to 35% with regional wall motion abnormalities and moderately reduced RV function mildly elevated pulmonary arterial systolic pressure.  Moderate mitral valve regurgitation.  Mild to moderate TR. Fluid status is managed by nephro and gets HD.  GDMT  has been limited by ESRD.  Ms. Gurule has extensive medical and cardiac history as above and has had multiple hospital admissions this year for recurrent AVM of the small bowel with hemorrhage and was just recently DC after 13 day hospitalization on 12/13/2022 where she required another blood transfusion and small bowel enteroscopy with jejunal diverticulum that showed six non bleeding angiodysplastic lesions in the proximal jejunum treated with argon plasma coagulation.   Patient is a very poor historian and often speaks tangentially and does not answer questions directly/appropriately. Does not appear to be acutely AMS, but gives conflicting reports from own previous statements and has a poor recollection of any recent events.  Per notes she came in with chest pain however patient adamantly denies any chest pain, but has been complaining of some shortness of breath that has since resolved. Now she is feeling tired/fatigued and not able to express her symptoms further than this.   Sodium 134, creatinine 2.95, albumin 2.8, total protein 5.8, RBC 2.17, hemoglobin 7.7.  Chest x-ray showed small bilateral pleural effusions. EKG showing wide complex tachycardia in the presence of left bundle branch block.  Heart rate 129. Trops slightly elevated in the 60s.   Past Medical History:  Diagnosis Date   Acute on chronic respiratory failure with hypoxia (HCC) 10/10/2016   Anxiety    Arthritis    AVM (arteriovenous malformation) of colon    CAD (coronary artery disease)    a. s/p CABG in 2013 b. DES to D1 in 10/2016. c. cath in 07/2018 showing patent grafts with occlusion of D1 at prior stent site and progression of  PDA disease --> medical management recommended   Carotid artery disease (HCC)    a. 60-79% LICA, 03/2012    Chronic bronchitis (HCC)    Chronic HFrEF (heart failure with reduced ejection fraction) (HCC)    Colon cancer (HCC) 1992   Esophageal stricture    ESRD on hemodialysis (HCC)    ESRD due  to HTN, started dialysis 2011 and gets HD at [facility] with Dr Fausto Skillern on MWF schedule.  Access is LUA AVF as of Sept 2014.    GERD (gastroesophageal reflux disease)    High cholesterol 12/2011   History of blood transfusion 07/2011; 12/2011; 01/2012 X 2; 04/2012   History of gout    History of lower GI bleeding    Hypertension    Iron deficiency anemia    Jugular vein occlusion, right (HCC)    Mitral regurgitation    a. Moderate by echo, 02/2012   Mitral valve disease    NSVT (nonsustained ventricular tachycardia) (HCC)    Ovarian cancer (HCC) 1992   PAF (paroxysmal atrial fibrillation) (HCC)    Pneumonia ~ 2009   PUD (peptic ulcer disease)    TIA (transient ischemic attack)    Tricuspid valve disease     Past Surgical History:  Procedure Laterality Date   A/V FISTULAGRAM Left 10/04/2022   Procedure: A/V Fistulagram;  Surgeon: Renford Dills, MD;  Location: [facility] INVASIVE CV LAB;  Service: Cardiovascular;  Laterality: Left;   A/V SHUNTOGRAM Left 03/19/2019   Procedure: A/V SHUNTOGRAM;  Surgeon: Renford Dills, MD;  Location: [facility] INVASIVE CV LAB;  Service: Cardiovascular;  Laterality: Left;   ABDOMINAL HYSTERECTOMY  1992   APPENDECTOMY  06/1990   AV FISTULA PLACEMENT  07/2009   left upper arm   AV FISTULA PLACEMENT Right 09/06/2016   Procedure: RIGHT FOREARM ARTERIOVENOUS (AV) GRAFT;  Surgeon: Sherren Kerns, MD;  [facility];  Service: Vascular;  Laterality: Right;   AV FISTULA PLACEMENT N/A 02/24/2017   Procedure: INSERTION OF ARTERIOVENOUS (AV) GORE-TEX GRAFT ARM (BRACHIAL AXILLARY);  Surgeon: Renford Dills, MD;  Location: [facility] ORS;  Service: Vascular;  Laterality: N/A;   AVGG REMOVAL Right 09/06/2016   Procedure: REMOVAL OF Right Arm ARTERIOVENOUS GORETEX GRAFT and Vein Patch angioplasty of brachial artery;  Surgeon: Chuck Hint, MD;  Location: [facility] OR;  Service: Vascular;  Laterality: Right;   BIOPSY  09/26/2019   Procedure: BIOPSY;  Surgeon: Malissa Hippo, MD;  [facility];  Service: Endoscopy;;   BIOPSY  11/01/2022   Procedure: BIOPSY;  Surgeon: Dolores Frame, MD;  [facility];  Service: Gastroenterology;;   COLON RESECTION  1992   COLON SURGERY     COLONOSCOPY N/A 03/09/2019   Procedure: COLONOSCOPY;  Surgeon: Malissa Hippo, MD;  [facility];  Service: Endoscopy;  Laterality: N/A;   COLONOSCOPY N/A 08/11/2022   Procedure: COLONOSCOPY;  Surgeon: Beverley Fiedler, MD;  Location: [facility] ENDOSCOPY;  Service: Gastroenterology;  Laterality: N/A;   COLONOSCOPY WITH PROPOFOL N/A 06/08/2021   Procedure: COLONOSCOPY WITH PROPOFOL;  Surgeon: Dolores Frame, MD;  [facility];  Service: Gastroenterology;  Laterality: N/A;  9:05 /Patient is on dialysis Mon Wed Fri   CORONARY ANGIOPLASTY WITH STENT PLACEMENT  12/15/11   "2"   CORONARY ANGIOPLASTY WITH STENT PLACEMENT  y/2013   "1; makes total of 3" (05/02/2012)   CORONARY ARTERY BYPASS GRAFT  06/13/2012   Procedure: CORONARY ARTERY BYPASS GRAFTING (CABG);  Surgeon: Delight Ovens, MD;  Location: [facility] OR;  Service: Open Heart Surgery;  Laterality: N/A;  cabg x four;  using left internal mammary artery, and left leg greater saphenous vein harvested endoscopically   CORONARY STENT INTERVENTION N/A 10/13/2016   Procedure: Coronary Stent Intervention;  Surgeon: Lennette Bihari, MD;  [facility];  Service: Cardiovascular;  Laterality: N/A;   DIALYSIS/PERMA CATHETER REMOVAL N/A 04/18/2017   Procedure: DIALYSIS/PERMA CATHETER REMOVAL;  Surgeon: Renford Dills, MD;  Location: [facility] INVASIVE CV LAB;  Service: Cardiovascular;  Laterality: N/A;   DILATION AND CURETTAGE OF UTERUS     ENTEROSCOPY N/A 06/08/2021   Procedure: PUSH ENTEROSCOPY;  Surgeon: Dolores Frame, MD;  [facility];  Service: Gastroenterology;  Laterality: N/A;   ENTEROSCOPY N/A 11/01/2022   Procedure: ENTEROSCOPY;  Surgeon: Dolores Frame, MD;  [facility];  Service: Gastroenterology;  Laterality: N/A;   ENTEROSCOPY N/A 12/01/2022   Procedure: ENTEROSCOPY;  Surgeon: Benancio Deeds, MD;  Location: [facility] ENDOSCOPY;  Service: Gastroenterology;  Laterality: N/A;   ESOPHAGOGASTRODUODENOSCOPY  01/20/2012   Procedure: ESOPHAGOGASTRODUODENOSCOPY (EGD);  Surgeon: Meryl Dare, MD,FACG;  Location: [facility] ENDOSCOPY;  Service: Endoscopy;  Laterality: N/A;   ESOPHAGOGASTRODUODENOSCOPY N/A 03/26/2013   Procedure: ESOPHAGOGASTRODUODENOSCOPY (EGD);  Surgeon: Hilarie Fredrickson, MD;  Location: [facility] ENDOSCOPY;  Service: Endoscopy;  Laterality: N/A;   ESOPHAGOGASTRODUODENOSCOPY N/A 04/30/2015   Procedure: ESOPHAGOGASTRODUODENOSCOPY (EGD);  Surgeon: Malissa Hippo, MD;  [facility];  Service: Endoscopy;  Laterality: N/A;  1pm - moved to 10/20 @ 1:10   ESOPHAGOGASTRODUODENOSCOPY N/A 07/29/2016   Procedure: ESOPHAGOGASTRODUODENOSCOPY (EGD);  Surgeon: Ruffin Frederick, MD;  Location: [facility] ENDOSCOPY;  Service: Gastroenterology;  Laterality: N/A;  enteroscopy   ESOPHAGOGASTRODUODENOSCOPY N/A 09/26/2019   Procedure: ESOPHAGOGASTRODUODENOSCOPY (EGD);  Surgeon: Malissa Hippo, MD;  [facility];  Service: Endoscopy;  Laterality: N/A;  1250   ESOPHAGOGASTRODUODENOSCOPY N/A 08/11/2022   Procedure: ESOPHAGOGASTRODUODENOSCOPY (EGD);  Surgeon: Beverley Fiedler, MD;  Location: [facility] ENDOSCOPY;  Service: Gastroenterology;  Laterality: N/A;   ESOPHAGOGASTRODUODENOSCOPY (EGD) WITH PROPOFOL N/A 02/05/2021   Procedure: ESOPHAGOGASTRODUODENOSCOPY (EGD) WITH PROPOFOL;  Surgeon: Lanelle Bal, DO;  [facility];  Service: Endoscopy;  Laterality: N/A;   ESOPHAGOGASTRODUODENOSCOPY (EGD) WITH PROPOFOL N/A 08/27/2022   Procedure: ESOPHAGOGASTRODUODENOSCOPY (EGD) WITH PROPOFOL;  Surgeon: Lanelle Bal, DO;  [facility];  Service: Endoscopy;  Laterality: N/A;   GIVENS CAPSULE STUDY N/A 03/07/2019   Procedure: GIVENS  CAPSULE STUDY;  Surgeon: Malissa Hippo, MD;  [facility];  Service: Endoscopy;  Laterality: N/A;  7:30   GIVENS CAPSULE STUDY N/A 04/22/2021   Procedure: GIVENS CAPSULE STUDY;  Surgeon: Malissa Hippo, MD;  [facility];  Service: Endoscopy;  Laterality: N/A;  7:30   GIVENS CAPSULE STUDY N/A 08/27/2022   Procedure: GIVENS CAPSULE STUDY;  Surgeon: Lanelle Bal, DO;  [facility];  Service: Endoscopy;  Laterality: N/A;   HOT HEMOSTASIS N/A 08/11/2022   Procedure: HOT HEMOSTASIS (ARGON PLASMA COAGULATION/BICAP);  Surgeon: Beverley Fiedler, MD;  Location: [facility] ENDOSCOPY;  Service: Gastroenterology;  Laterality: N/A;   HOT HEMOSTASIS N/A 12/01/2022   Procedure: HOT HEMOSTASIS (ARGON PLASMA COAGULATION/BICAP);  Surgeon: Benancio Deeds, MD;  Location: [facility] ENDOSCOPY;  Service: Gastroenterology;  Laterality: N/A;   INSERTION OF DIALYSIS CATHETER N/A 10/05/2020   Procedure: ABORTED TUNNELED DIALYSIS CATHETER PLACEMENT RIGHT INTERNAL JUGULAR VEIN ;  Surgeon: Lucretia Roers,  MD;  Location: [facility];  Service: General;  Laterality: N/A;   INTRAOPERATIVE TRANSESOPHAGEAL ECHOCARDIOGRAM  06/13/2012   Procedure: INTRAOPERATIVE TRANSESOPHAGEAL ECHOCARDIOGRAM;  Surgeon: Delight Ovens, MD;  Location: [facility] OR;  Service: Open Heart Surgery;  Laterality: N/A;   IR DIALY SHUNT INTRO NEEDLE/INTRACATH INITIAL W/IMG LEFT Left 10/06/2020   IR FLUORO GUIDE CV LINE RIGHT  06/17/2020   IR FLUORO GUIDE CV LINE RIGHT  11/12/2022   IR GENERIC HISTORICAL  07/26/2016   IR FLUORO GUIDE CV LINE RIGHT 07/26/2016 Berdine Dance, MD [facility]   IR GENERIC HISTORICAL  07/26/2016   IR US GUIDE VASC ACCESS RIGHT 07/26/2016 Berdine Dance, MD [facility]   IR GENERIC HISTORICAL  08/02/2016   IR US GUIDE VASC ACCESS RIGHT 08/02/2016 Berdine Dance, MD [facility]   IR GENERIC HISTORICAL  08/02/2016   IR FLUORO GUIDE CV LINE RIGHT 08/02/2016 Berdine Dance, MD [facility]   IR RADIOLOGY PERIPHERAL  GUIDED IV START  03/28/2017   IR REMOVAL TUN CV CATH W/O FL  08/11/2020   IR REMOVAL TUN CV CATH W/O FL  11/15/2022   IR THROMBECTOMY AV FISTULA W/THROMBOLYSIS INC/SHUNT/IMG LEFT Left 06/17/2020   IR US GUIDE VASC ACCESS LEFT  06/17/2020   IR US GUIDE VASC ACCESS RIGHT  03/28/2017   IR US GUIDE VASC ACCESS RIGHT  06/17/2020   IR US GUIDE VASC ACCESS RIGHT  11/12/2022   LEFT HEART CATH AND CORONARY ANGIOGRAPHY N/A 09/20/2016   Procedure: Left Heart Cath and Coronary Angiography;  Surgeon: Lyn Records, MD;  Location: [facility] INVASIVE CV LAB;  Service: Cardiovascular;  Laterality: N/A;   LEFT HEART CATH AND CORS/GRAFTS ANGIOGRAPHY N/A 10/13/2016   Procedure: Left Heart Cath and Cors/Grafts Angiography;  Surgeon: Lennette Bihari, MD;  [facility];  Service: Cardiovascular;  Laterality: N/A;   LEFT HEART CATH AND CORS/GRAFTS ANGIOGRAPHY N/A 07/13/2018   Procedure: LEFT HEART CATH AND CORS/GRAFTS ANGIOGRAPHY;  Surgeon: Swaziland, Peter M, MD;  Location: [facility] INVASIVE CV LAB;  Service: Cardiovascular;  Laterality: N/A;   LEFT HEART CATH AND CORS/GRAFTS ANGIOGRAPHY N/A 07/22/2021   Procedure: LEFT HEART CATH AND CORS/GRAFTS ANGIOGRAPHY;  Surgeon: Runell Gess, MD;  [facility] INVAS[facility] CV LAB;  Service: Cardiovascular;  Laterality: N/A;   LEFT HEART CATHETERIZATION WITH CORONARY ANGIOGRAM N/A 12/15/2011   Procedure: LEFT HEART CATHETERIZATION WITH CORONARY ANGIOGRAM;  Surgeon: Kathleene Hazel, MD;  Location: [facility] CATH LAB;  Service: Cardiovascular;  Laterality: N/A;   LEFT HEART CATHETERIZATION WITH CORONARY ANGIOGRAM N/A 01/10/2012   Procedure: LEFT HEART CATHETERIZATION WITH CORONARY ANGIOGRAM;  Surgeon: Peter M Swaziland, MD;  Location: [facility] CATH LAB;  Service: Cardiovascular;  Laterality: N/A;   LEFT HEART CATHETERIZATION WITH CORONARY ANGIOGRAM N/A 06/08/2012   Procedure: LEFT HEART CATHETERIZATION WITH CORONARY ANGIOGRAM;  Surgeon: Kathleene Hazel, MD;  Location: [facility] CATH LAB;  Service:  Cardiovascular;  Laterality: N/A;   LEFT HEART CATHETERIZATION WITH CORONARY/GRAFT ANGIOGRAM N/A 12/10/2013   Procedure: LEFT HEART CATHETERIZATION WITH Isabel Caprice;  Surgeon: Corky Crafts, MD;  Location: [facility] CATH LAB;  Service: Cardiovascular;  Laterality: N/A;   OVARY SURGERY     ovarian cancer   POLYPECTOMY  03/09/2019   Procedure: POLYPECTOMY;  Surgeon: Malissa Hippo, MD;  [facility];  Service: Endoscopy;;  cecal    POLYPECTOMY N/A 09/26/2019   Procedure: DUODENAL POLYPECTOMY;  Surgeon: Malissa Hippo, MD;  [facility];  Service: Endoscopy;  Laterality: N/A;  POLYPECTOMY  08/11/2022   Procedure: POLYPECTOMY;  Surgeon: Beverley Fiedler, MD;  Location: [facility] ENDOSCOPY;  Service: Gastroenterology;;   REVISION OF ARTERIOVENOUS GORETEX GRAFT N/A 02/24/2017   Procedure: REVISION OF ARTERIOVENOUS GORETEX GRAFT (RESECTION);  Surgeon: Renford Dills, MD;  Location: [facility] ORS;  Service: Vascular;  Laterality: N/A;   REVISON OF ARTERIOVENOUS FISTULA Left 06/19/2020   Procedure: REVISION OF LEFT UPPER ARM AV GRAFT WITH INTERPOSITION JUMP GRAFT USING GORE LIMB;  Surgeon: Cephus Shelling, MD;  Location: [facility] OR;  Service: Vascular;  Laterality: Left;   SHUNTOGRAM N/A 10/15/2013   Procedure: Fistulogram;  Surgeon: Nada Libman, MD;  Location: [facility] CATH LAB;  Service: Cardiovascular;  Laterality: N/A;   SUBMUCOSAL TATTOO INJECTION  11/01/2022   Procedure: SUBMUCOSAL TATTOO INJECTION;  Surgeon: Dolores Frame, MD;  [facility];  Service: Gastroenterology;;   THROMBECTOMY / ARTERIOVENOUS GRAFT REVISION  2011   left upper arm   TUBAL LIGATION  1980's   UPPER EXTREMITY ANGIOGRAPHY Bilateral 12/06/2016   Procedure: Upper Extremity Angiography;  Surgeon: Renford Dills, MD;  Location: [facility] INVASIVE CV LAB;  Service: Cardiovascular;  Laterality: Bilateral;   UPPER EXTREMITY INTERVENTION Left 06/06/2017   Procedure: UPPER EXTREMITY  INTERVENTION;  Surgeon: Renford Dills, MD;  Location: [facility] INVASIVE CV LAB;  Service: Cardiovascular;  Laterality: Left;   UPPER EXTREMITY VENOGRAPHY Left 10/04/2022   Procedure: UPPER EXTREMITY VENOGRAPHY;  Surgeon: Renford Dills, MD;  Location: [facility] INVASIVE CV LAB;  Service: Cardiovascular;  Laterality: Left;     Home Medications:  Prior to Admission medications   Medication Sig Start Date End Date Taking? Authorizing Provider  ALPRAZolam Prudy Feeler) 0.5 MG tablet Take 1 tablet (0.5 mg total) by mouth daily as needed for anxiety. 12/13/22   Jerald Kief, MD  amiodarone (PACERONE) 100 MG tablet Take 1 tablet (100 mg total) by mouth daily. 04/27/22   Rhetta Mura, MD  amiodarone (PACERONE) 200 MG tablet Take 200 mg by mouth daily. 11/21/22   [provider]  atorvastatin (LIPITOR) 10 MG tablet Take 1 tablet by mouth at bedtime.    [provider]  benzonatate (TESSALON) 100 MG capsule Take 100 mg by mouth 3 (three) times daily as needed for cough. 05/10/22   [provider]  carvedilol (COREG) 6.25 MG tablet Take 1 tablet (6.25 mg total) by mouth 2 (two) times daily with a meal. 12/06/22 01/05/23  Arrien, York Ram, MD  cyclobenzaprine (FLEXERIL) 5 MG tablet Take 5 mg by mouth 3 (three) times daily as needed.    [provider]  folic acid (FOLVITE) 1 MG tablet Take 1 mg by mouth daily. 05/24/22   [provider]  gabapentin (NEURONTIN) 100 MG capsule Take 1 capsule (100 mg total) by mouth every evening. 12/13/22   Jerald Kief, MD  guaiFENesin-codeine 100-10 MG/5ML syrup Take 5 mLs by mouth every 4 (four) hours as needed for cough. 10/23/22   Jacalyn Lefevre, MD  hydrALAZINE (APRESOLINE) 50 MG tablet Take 1 tablet (50 mg total) by mouth every 8 (eight) hours. 12/13/22 01/12/23  Jerald Kief, MD  hydrOXYzine (ATARAX) 25 MG tablet Take 25 mg by mouth every 6 (six) hours as needed for itching. 06/06/22   [provider]   levETIRAcetam (KEPPRA) 500 MG tablet Take 1 tablet (500 mg total) by mouth daily. 11/15/22   Leroy Sea, MD  levothyroxine (SYNTHROID) 75 MCG tablet Take 1 tablet (75 mcg total) by mouth daily before  breakfast. 08/14/22 12/02/22  Hughie Closs, MD  multivitamin (RENA-VIT) TABS tablet Take 1 tablet by mouth daily.    [provider]  nitroGLYCERIN (NITROSTAT) 0.4 MG SL tablet Place 0.4 mg under the tongue every 5 (five) minutes as needed for chest pain.    [provider]  octreotide (SANDOSTATIN LAR) 20 MG injection Inject 20 mg into the muscle every 28 (twenty-eight) days. 11/15/22   Leroy Sea, MD  omeprazole (PRILOSEC) 40 MG capsule Take 1 capsule (40 mg total) by mouth daily. 10/06/22   Aida Raider, NP  ondansetron (ZOFRAN-ODT) 4 MG disintegrating tablet Take 4 mg by mouth every 8 (eight) hours as needed for nausea or vomiting. 05/05/22   [provider]  rosuvastatin (CRESTOR) 10 MG tablet Take 1 tablet by mouth once daily 06/06/22   Iran Ouch, [place] M, PA-C  sevelamer carbonate (RENVELA) 800 MG tablet Take 3,200 mg by mouth in the morning and at bedtime. 12/27/21   [provider]    Inpatient Medications: Scheduled Meds:  Continuous Infusions:  amiodarone 30 mg/hr (12/15/22 1054)   PRN Meds: acetaminophen **OR** acetaminophen, ondansetron **OR** ondansetron (ZOFRAN) IV  Allergies:    Allergies  Allergen Reactions   Amlodipine Swelling   Aspirin Other (See Comments)    High Doses Mess up her stomach; "makes my bowels have blood in them". Takes 81 mg EC Aspirin    Nitrofurantoin Hives   Ranexa [Ranolazine] Other (See Comments)    Myoclonus-hospitalized    Bactrim [Sulfamethoxazole-Trimethoprim] Rash   Contrast Media [Iodinated Contrast Media] Itching   Iron Itching and Other (See Comments)    "they gave me iron in dialysis; had to give me Benadryl cause I had to have the iron" (05/02/2012)   Gabapentin Other (See Comments)     Unknown reaction   Iron Sucrose Other (See Comments)    Unknown   Sucroferric Oxyhydroxide Other (See Comments)    Unknown   Levaquin [Levofloxacin In D5w] Rash   Pantoprazole Rash   Plavix [Clopidogrel Bisulfate] Rash   Protonix [Pantoprazole Sodium] Rash   Venofer [Ferric Oxide] Itching and Other (See Comments)    Patient reports using Benadryl prior to doses as [facility]    Social History:   Social History   Socioeconomic History   Marital status: Married    Spouse name: Willean Hornburg   Number of children: 1   Years of education: Not on file   Highest education level: GED or equivalent  Occupational History   Occupation: retired- Architectural technologist- sewed  Tobacco Use   Smoking status: Never   Smokeless tobacco: Never  Vaping Use   Vaping Use: Never used  Substance and Sexual Activity   Alcohol use: No    Alcohol/week: 0.0 standard drinks of alcohol   Drug use: No   Sexual activity: Not Currently    Birth control/protection: Surgical  Other Topics Concern   Not on file  Social History Narrative   Lives in [place], [place] with husband.  Dialysis pt - [facility].   Social Determinants of Health   Financial Resource Strain: Low Risk  (12/14/2018)   Overall Financial Resource Strain (CARDIA)    Difficulty of Paying Living Expenses: Not very hard  Food Insecurity: No Food Insecurity (12/12/2022)   Hunger Vital Sign    Worried About Running Out of Food in the Last Year: Never true    Ran Out of Food in the Last Year: Never true  Transportation Needs: No Transportation Needs (  12/12/2022)   PRAPARE - Administrator, Civil Service (Medical): No    Lack of Transportation (Non-Medical): No  Physical Activity: Unknown (12/14/2018)   Exercise Vital Sign    Days of Exercise per Week: Patient declined    Minutes of Exercise per Session: Patient declined  Stress: No Stress Concern Present (12/14/2018)   [employer] of Occupational Health - Occupational Stress  Questionnaire    Feeling of Stress : Only a little  Social Connections: Unknown (12/14/2018)   Social Connection and Isolation Panel [NHANES]    Frequency of Communication with Friends and Family: Patient declined    Frequency of Social Gatherings with Friends and Family: Patient declined    Attends Religious Services: Patient declined    Database administrator or Organizations: Patient declined    Attends Banker Meetings: Patient declined    Marital Status: Patient declined  Intimate Partner Violence: Not At Risk (12/12/2022)   Humiliation, Afraid, Rape, and Kick questionnaire    Fear of Current or Ex-Partner: No    Emotionally Abused: No    Physically Abused: No    Sexually Abused: No    Family History:    Family History  Problem Relation Age of Onset   Heart disease Mother        Heart Disease before age 64   Hyperlipidemia Mother    Hypertension Mother    Diabetes Mother    Heart attack Mother    Heart disease Father        Heart Disease before age 33   Hyperlipidemia Father    Hypertension Father    Diabetes Father    Diabetes Sister    Hypertension Sister    Diabetes Brother    Hyperlipidemia Brother    Heart attack Brother    Hypertension Sister    Heart attack Brother    Colon cancer Child 29   Other Other        noncontributory for early CAD   Esophageal cancer Neg Hx    Liver disease Neg Hx    Kidney disease Neg Hx    Colon polyps Neg Hx      ROS:  Please see the history of present illness.   All other ROS reviewed and negative.     Physical Exam/Data:   Vitals:   12/14/22 2250 12/15/22 0420 12/15/22 0600 12/15/22 0743  BP: (!) 172/97 (!) 159/84 (!) 160/79 (!) 151/84  Pulse: 74 (!) 122 (!) 120 (!) 127  Resp: 16 17 20 14   Temp: 98.6 F (37 C) 98.1 F (36.7 C)  98.7 F (37.1 C)  TempSrc: Oral Oral  Oral  SpO2: 100% 100% 100% 99%  Weight: 60 kg     Height: 5\' 2"  (1.575 m)       Intake/Output Summary (Last 24 hours) at 12/15/2022  1220 Last data filed at 12/15/2022 0804 Gross per 24 hour  Intake --  Output 0 ml  Net 0 ml      12/14/2022   10:50 PM 12/14/2022    5:30 PM 12/12/2022    5:26 PM  Last 3 Weights  Weight (lbs) 132 lb 4.4 oz 125 lb 7.1 oz 125 lb 7.1 oz  Weight (kg) 60 kg 56.9 kg 56.9 kg     Body mass index is 24.19 kg/m.  General: elderly female , thin ,  in no acute distress HEENT: normal Neck: no JVD,  Right EJ IV ,   left subclavian HD catheter in  place  Vascular: No carotid bruits; Distal pulses 2+ bilaterally Cardiac:  normal S1, S2; RRR; no murmur , tachy  Lungs:  clear to auscultation bilaterally, no wheezing, rhonchi or rales  Abd: soft, nontender, no hepatomegaly  Ext: no edema Musculoskeletal:  No deformities, BUE and BLE strength normal and equal Skin: warm and dry  Neuro:  CNs 2-12 intact, no focal abnormalities noted Psych:  Normal affect   EKG:  The EKG was personally reviewed and demonstrates:  LBBB,   atrial tach  Telemetry:  Sinus rhythm with episodes of atrial tach   Relevant CV Studies:  Left heart catheterization 07/22/2021   Mid LM to Dist LM lesion is 60% stenosed.   Prox LAD lesion is 90% stenosed.   Ost Cx to Mid Cx lesion is 99% stenosed.   Ost RCA to Prox RCA lesion is 100% stenosed.   Ost Ramus to Ramus lesion is 80% stenosed.   Ost 1st Diag to 1st Diag lesion is 100% stenosed.   Ost RPDA to RPDA lesion is 75% stenosed.   2nd Mrg lesion is 60% stenosed.   Dist LAD lesion is 70% stenosed.   LIMA and is large.   SVG and is large.   SVG and is normal in caliber.   SVG and is large.   The graft exhibits no disease.   The graft exhibits no disease.   The graft exhibits no disease.   LV end diastolic pressure is mildly elevated.   The left ventricular ejection fraction is 35-45% by visual estimate.  Echocardiogram 07/16/2021 1. Left ventricular ejection fraction, by estimation, is 30 to 35% . The left ventricle has moderately decreased function. The left ventricle  demonstrates regional wall motion abnormalities ( see scoring diagram/ findings for description) . There is mild left ventricular hypertrophy. Left ventricular diastolic parameters are consistent with Grade II diastolic dysfunction ( pseudonormalization) . Elevated left atrial pressure. 2. Right ventricular systolic function is moderately reduced. The right ventricular size is normal. There is mildly elevated pulmonary artery systolic pressure. The estimated right ventricular systolic pressure is 43. 4 mmHg. 3. Left atrial size was severely dilated. 4. Right atrial size was mildly dilated. 5. The mitral valve is degenerative. Moderate mitral valve regurgitation. Mild to moderate mitral stenosis. MG at 70bpm, MVA 1. 2 cm^ 2 by continuity equation. Moderate mitral annular calcification. 6. Tricuspid valve regurgitation is mild to moderate. 7. The aortic valve is tricuspid. Aortic valve regurgitation is not visualized. Aortic valve sclerosis/ calcification is present, without any evidence of aortic stenosis. 8. The inferior vena cava is normal in size with greater than 50% respiratory variability, suggesting right atrial pressure of 3 mmHg.  Laboratory Data:  High Sensitivity Troponin:   Recent Labs  Lab 12/14/22 1857  TROPONINIHS 66*     Chemistry Recent Labs  Lab 12/09/22 0206 12/11/22 0231 12/12/22 1404 12/13/22 0337 12/14/22 1857  NA 131*   < > 131* 132* 134*  K 4.8   < > 4.6 3.9 3.5  CL 94*   < > 92* 94* 96*  CO2 27   < > 27 25 26   GLUCOSE 98   < > 106* 112* 120*  BUN 30*   < > 40* 14 10  CREATININE 6.11*   < > 7.59* 4.18* 2.95*  CALCIUM 8.6*   < > 8.7* 8.2* 8.0*  MG 2.7*  --   --   --   --   GFRNONAA 6*   < > 5* 10*  15*  ANIONGAP 10   < > 12 13 12    < > = values in this interval not displayed.    Recent Labs  Lab 12/12/22 1404 12/13/22 0337 12/14/22 1857  PROT 5.0* 5.1* 5.8*  ALBUMIN 2.4* 2.5* 2.8*  AST 13* 15 18  ALT 10 10 12   ALKPHOS 77 73 85  BILITOT 0.6 0.7 0.8    Lipids No results for input(s): "CHOL", "TRIG", "HDL", "LABVLDL", "LDLCALC", "CHOLHDL" in the last 168 hours.  Hematology Recent Labs  Lab 12/12/22 1401 12/13/22 0337 12/14/22 1857  WBC 5.5 5.0 7.0  RBC 1.98* 2.09* 2.17*  HGB 7.1* 7.4* 7.7*  HCT 22.2* 22.7* 24.0*  MCV 112.1* 108.6* 110.6*  MCH 35.9* 35.4* 35.5*  MCHC 32.0 32.6 32.1  RDW 21.9* 21.5* 21.5*  PLT 234 210 259   Thyroid No results for input(s): "TSH", "FREET4" in the last 168 hours.  BNPNo results for input(s): "BNP", "PROBNP" in the last 168 hours.  DDimer No results for input(s): "DDIMER" in the last 168 hours.   Radiology/Studies:  DG Chest Port 1 View  Result Date: 12/14/2022 CLINICAL DATA:  Altered mental status tachycardia EXAM: PORTABLE CHEST 1 VIEW COMPARISON:  Previous studies including the examination of 12/04/2022 FINDINGS: Transverse diameter of heart is increased. There are no signs of alveolar pulmonary edema. Small bilateral pleural effusions are noted, more so on the left side with interval increase. Linear densities are seen in left lower lung field. There is no pneumothorax. Tip of left IJ dialysis catheter is seen in the region of junction of superior vena cava and right atrium. Vascular stents are noted in the left upper arm. Degenerative changes are noted in right shoulder. There is previous coronary bypass surgery. IMPRESSION: Small bilateral pleural effusions, more so on the left side with interval increase. Cardiomegaly. There are no signs of alveolar pulmonary edema. Linear densities in the left lower lung field may suggest subsegmental atelectasis. Electronically Signed   By: Ernie Avena M.D.   On: 12/14/2022 18:28     Assessment and Plan:   Atrial tachycardia: The patient presents and is found to have episodes of atrial tachycardia.  She has a left bundle branch block at baseline so I would not necessarily call this a new wide complex tachycardia.   She does have sudden step up and  step down of her HR so this does appear to be an atrial tachycardia / reentry tachycardia   She was on carvedilol at home and the beta-blocker has been held.  Will start her on metoprolol 25 mg twice a day.  Continue amiodarone for now.  2..  History of coronary artery disease: She denies having any episodes of angina.  3.  History of atrial fibrillation/atrial flutter: She is not on anticoagulation due to long history of complex GI bleeds.  She apparently has bleeding AVMs diagnosed on multiple occasions.  4.  Left bundle branch block: Chronic     Risk Assessment/Risk Scores:     For questions or updates, please contact [facility] Please consult [email or web address] for contact info under     Kristeen Miss, MD  12/15/2022 2:43 PM    [facility] [address] [place],  [address] [place], [place]  [address] Phone: [phone]; Fax: [phone]

## 2022-12-16 ENCOUNTER — Other Ambulatory Visit (HOSPITAL_COMMUNITY): Payer: Self-pay

## 2022-12-16 DIAGNOSIS — I4892 Unspecified atrial flutter: Secondary | ICD-10-CM | POA: Diagnosis not present

## 2022-12-16 DIAGNOSIS — I4719 Other supraventricular tachycardia: Secondary | ICD-10-CM | POA: Diagnosis not present

## 2022-12-16 LAB — HEPATITIS B SURFACE ANTIBODY, QUANTITATIVE: Hep B S AB Quant (Post): 319 m[IU]/mL (ref >9.9)

## 2022-12-16 LAB — FOLATE: Folate: 37.9 ng/mL (ref >5.9)

## 2022-12-16 MED ORDER — AMIODARONE HCL 200 MG PO TABS
200.0000 mg | ORAL_TABLET | Freq: Every day | ORAL | Status: DC
  Administered 2022-12-24 – 2022-12-28 (×5): 200 mg via ORAL
  Filled 2022-12-16 (×6): qty 1

## 2022-12-16 MED ORDER — LIDOCAINE HCL (PF) 1 % IJ SOLN: 5.0000 mL | INTRAMUSCULAR | Status: DC | PRN

## 2022-12-16 MED ORDER — HEPARIN SODIUM (PORCINE) 1000 UNIT/ML DIALYSIS: 1000.0000 [IU] | INTRAMUSCULAR | Status: DC | PRN

## 2022-12-16 MED ORDER — ANTICOAGULANT SODIUM CITRATE 4% (200MG/5ML) IV SOLN: 5.0000 mL | Status: DC | PRN

## 2022-12-16 MED ORDER — GABAPENTIN 100 MG PO CAPS
100.0000 mg | ORAL_CAPSULE | Freq: Every evening | ORAL | Status: DC
  Administered 2022-12-16 – 2022-12-27 (×12): 100 mg via ORAL
  Filled 2022-12-16 (×12): qty 1

## 2022-12-16 MED ORDER — LEVOTHYROXINE SODIUM 75 MCG PO TABS
75.0000 ug | ORAL_TABLET | Freq: Every day | ORAL | Status: DC
  Administered 2022-12-17 – 2022-12-28 (×12): 75 ug via ORAL
  Filled 2022-12-16 (×12): qty 1

## 2022-12-16 MED ORDER — LIDOCAINE-PRILOCAINE 2.5-2.5 % EX CREA: 1.0000 | TOPICAL_CREAM | CUTANEOUS | Status: DC | PRN

## 2022-12-16 MED ORDER — HEPARIN SODIUM (PORCINE) 1000 UNIT/ML IJ SOLN
3800.0000 [IU] | Freq: Once | INTRAMUSCULAR | Status: AC
  Administered 2022-12-16: 3800 [IU]
  Filled 2022-12-16: qty 4

## 2022-12-16 MED ORDER — AMIODARONE HCL 200 MG PO TABS
200.0000 mg | ORAL_TABLET | Freq: Two times a day (BID) | ORAL | Status: AC
  Administered 2022-12-16 – 2022-12-23 (×14): 200 mg via ORAL
  Filled 2022-12-16 (×13): qty 1

## 2022-12-16 MED ORDER — LEVETIRACETAM 500 MG PO TABS
500.0000 mg | ORAL_TABLET | Freq: Every day | ORAL | Status: DC
  Administered 2022-12-16 – 2022-12-27 (×12): 500 mg via ORAL
  Filled 2022-12-16 (×13): qty 1

## 2022-12-16 MED ORDER — ALPRAZOLAM 0.5 MG PO TABS
0.5000 mg | ORAL_TABLET | Freq: Every day | ORAL | Status: DC | PRN
  Administered 2022-12-19: 0.5 mg via ORAL
  Filled 2022-12-16: qty 1

## 2022-12-16 MED ORDER — ATORVASTATIN CALCIUM 10 MG PO TABS
10.0000 mg | ORAL_TABLET | Freq: Every day | ORAL | Status: DC
  Administered 2022-12-16 – 2022-12-27 (×12): 10 mg via ORAL
  Filled 2022-12-16 (×12): qty 1

## 2022-12-16 MED ORDER — ALTEPLASE 2 MG IJ SOLR: 2.0000 mg | Freq: Once | INTRAMUSCULAR | Status: DC | PRN

## 2022-12-16 MED ORDER — PENTAFLUOROPROP-TETRAFLUOROETH EX AERO: 1.0000 | INHALATION_SPRAY | CUTANEOUS | Status: DC | PRN

## 2022-12-16 NOTE — Telephone Encounter (Signed)
Free drug program forms on Dr. Wilburt Finlay desk for signature.

## 2022-12-16 NOTE — Progress Notes (Signed)
Pt known to navigator from past admissions. Pt receives out-pt HD at [facility] on MWF. Pt was d/c to snf on pt's last d/c from hospital. Will assist as needed.   Olivia Canter Renal Navigator [phone]

## 2022-12-16 NOTE — Procedures (Signed)
I was present at this dialysis session. I have reviewed the session itself and made appropriate changes.   Vital signs in last 24 hours:  Temp:  [97.7 F (36.5 C)-99.3 F (37.4 C)] 98.3 F (36.8 C) (06/07 0717) Pulse Rate:  [53-125] 67 (06/07 0800) Resp:  [14-25] 22 (06/07 0800) BP: (146-197)/(49-83) 197/79 (06/07 0800) SpO2:  [96 %-100 %] 98 % (06/07 0800) Weight:  [57.4 kg-62.1 kg] 62.1 kg (06/07 0721) Weight change: 0.525 kg Filed Weights   12/14/22 2250 12/16/22 0445 12/16/22 0721  Weight: 60 kg 57.4 kg 62.1 kg    Recent Labs  Lab 12/14/22 1857  NA 134*  K 3.5  CL 96*  CO2 26  GLUCOSE 120*  BUN 10  CREATININE 2.95*  CALCIUM 8.0*    Recent Labs  Lab 12/12/22 1401 12/13/22 0337 12/14/22 1857  WBC 5.5 5.0 7.0  NEUTROABS  --  3.8 5.6  HGB 7.1* 7.4* 7.7*  HCT 22.2* 22.7* 24.0*  MCV 112.1* 108.6* 110.6*  PLT 234 210 259    Scheduled Meds:  calcitRIOL  1.75 mcg Oral Q M,W,F-HD   Chlorhexidine Gluconate Cloth  6 each Topical Q0600   cinacalcet  30 mg Oral Q M,W,F-HD   metoprolol tartrate  25 mg Oral BID   Continuous Infusions:  amiodarone 30 mg/hr (12/16/22 0102)   anticoagulant sodium citrate     PRN Meds:.acetaminophen **OR** acetaminophen, alteplase, anticoagulant sodium citrate, guaiFENesin-dextromethorphan, heparin, lidocaine (PF), lidocaine-prilocaine, ondansetron **OR** ondansetron (ZOFRAN) IV, pentafluoroprop-tetrafluoroeth   Irena Cords,  MD 12/16/2022, 8:30 AM

## 2022-12-16 NOTE — Progress Notes (Signed)
PROGRESS NOTE    SIRENA KISSELBURG  ZOX:[id number] DOB: 29-Jan-1941 DOA: 12/14/2022 PCP: [facility]   Brief Narrative: 82 year old with past medical history significant for hypertension, hyperlipidemia, CAD status post CABG and PCI, paroxysmal A-fib, no longer on anticoagulation due to recurrent GI bleed, ESRD on hemodialysis (MWF), seizure disorder, hypothyroidism, history of pulmonary.  Presented to the ED complaining of chest pain while she was returning from dialysis the day of admission.  Reports chest pain sharp in nature, 10 out of 10 associated with palpitation.  Patient admission to [facility]/due to AVM, received blood transfusion and was treated with argon plasma hospitalization. Developed paroxysmal Atrial flutter  which was difficult to control she was placed on amiodarone and Coreg.  Readmitted 6/05 with chest pain and atrial tachycardia. Cardiology consulted. Started on Amiodarone IV., she will be transition to oral amiodarone 6/07   Assessment & Plan:   Principal Problem:   Paroxysmal atrial flutter (HCC) Active Problems:   Macrocytic anemia   End-stage renal disease on hemodialysis (HCC)   Essential hypertension   Diabetes mellitus type 2 in nonobese (HCC)   Acquired hypothyroidism   Mixed hyperlipidemia   Wide-complex tachycardia   1-Atrial Tachycardia:  -Patient was started on Amiodarone Gtt. Transition to oral 6/07. Monitor overnight, on transition from IV to oral amiodarone.  -Not on anticoagulation due to history of GI bleed. AVM.  -Cardiology has been consulted.  -Started on metoprolol.   Atypical chest pain Mild elevation of troponin in setting of tachycardia.  Chest pain free.  Cardiology following   ESRD on hemodialysis:  Had HD 6/05. Nephrology consulted.   Microcytic anemia: Check Folic acid and B 12.  Monitor hb. 7.7 History of GI bleed.   Essential hypertension On BB  Holding Hydralazine.   Mixed hyperlipidemia On statins.    Hypothyroidism; on synthroid.     Estimated body mass index is 25.36 kg/m as calculated from the following:   Height as of this encounter: 5\' 2"  (1.575 m).   Weight as of this encounter: 62.9 kg.   DVT prophylaxis: SCD Code Status: Full code Family Communication: Care discussed with patient.  Disposition Plan:  Status is: Inpatient Remains inpatient appropriate because: management of Tachycardia.     Consultants:  Cardiology Nephrology   Procedures:  None  Antimicrobials:    Subjective: She denies chest pain.  She felt bad at HD today., had SOB   Objective: Vitals:   12/16/22 1058 12/16/22 1107 12/16/22 1129 12/16/22 1136  BP: (!) 183/67  (!) 191/71 (!) 191/71  Pulse: 72  74   Resp: (!) 22  20   Temp:   98.1 F (36.7 C)   TempSrc:   Axillary   SpO2: 96%  98%   Weight:  62.9 kg    Height:        Intake/Output Summary (Last 24 hours) at 12/16/2022 1305 Last data filed at 12/16/2022 1156 Gross per 24 hour  Intake 330.44 ml  Output 700 ml  Net -369.56 ml    Filed Weights   12/16/22 0445 12/16/22 0721 12/16/22 1107  Weight: 57.4 kg 62.1 kg 62.9 kg    Examination:  General exam: NAD Respiratory system: CTA Cardiovascular system: S 1, S 2 RRR Gastrointestinal system: BS present, soft, nt Central nervous system: Alert Extremities: no edema    Data Reviewed: I have personally reviewed following labs and imaging studies  CBC: Recent Labs  Lab 12/12/22 1401 12/13/22 0337 12/14/22 1857  WBC 5.5 5.0 7.0  NEUTROABS  --  3.8 5.6  HGB 7.1* 7.4* 7.7*  HCT 22.2* 22.7* 24.0*  MCV 112.1* 108.6* 110.6*  PLT 234 210 259    Basic Metabolic Panel: Recent Labs  Lab 12/11/22 0231 12/12/22 1404 12/13/22 0337 12/14/22 1857  NA 130* 131* 132* 134*  K 4.7 4.6 3.9 3.5  CL 93* 92* 94* 96*  CO2 23 27 25 26   GLUCOSE 88 106* 112* 120*  BUN 28* 40* 14 10  CREATININE 5.28* 7.59* 4.18* 2.95*  CALCIUM 8.3* 8.7* 8.2* 8.0*    GFR: Estimated Creatinine  Clearance: 12.8 mL/min (A) (by C-G formula based on SCr of 2.95 mg/dL (H)). Liver Function Tests: Recent Labs  Lab 12/11/22 0231 12/12/22 1404 12/13/22 0337 12/14/22 1857  AST 18 13* 15 18  ALT 9 10 10 12   ALKPHOS 60 77 73 85  BILITOT 0.8 0.6 0.7 0.8  PROT 4.7* 5.0* 5.1* 5.8*  ALBUMIN 2.3* 2.4* 2.5* 2.8*    No results for input(s): "LIPASE", "AMYLASE" in the last 168 hours. No results for input(s): "AMMONIA" in the last 168 hours. Coagulation Profile: No results for input(s): "INR", "PROTIME" in the last 168 hours. Cardiac Enzymes: No results for input(s): "CKTOTAL", "CKMB", "CKMBINDEX", "TROPONINI" in the last 168 hours. BNP (last 3 results) No results for input(s): "PROBNP" in the last 8760 hours. HbA1C: No results for input(s): "HGBA1C" in the last 72 hours. CBG: No results for input(s): "GLUCAP" in the last 168 hours.  Lipid Profile: No results for input(s): "CHOL", "HDL", "LDLCALC", "TRIG", "CHOLHDL", "LDLDIRECT" in the last 72 hours. Thyroid Function Tests: No results for input(s): "TSH", "T4TOTAL", "FREET4", "T3FREE", "THYROIDAB" in the last 72 hours. Anemia Panel: Recent Labs    12/16/22 0727  FOLATE 37.9   Sepsis Labs: No results for input(s): "PROCALCITON", "LATICACIDVEN" in the last 168 hours.  No results found for this or any previous visit (from the past 240 hour(s)).        Radiology Studies: DG Chest Port 1 View  Result Date: 12/14/2022 CLINICAL DATA:  Altered mental status tachycardia EXAM: PORTABLE CHEST 1 VIEW COMPARISON:  Previous studies including the examination of 12/04/2022 FINDINGS: Transverse diameter of heart is increased. There are no signs of alveolar pulmonary edema. Small bilateral pleural effusions are noted, more so on the left side with interval increase. Linear densities are seen in left lower lung field. There is no pneumothorax. Tip of left IJ dialysis catheter is seen in the region of junction of superior vena cava and right  atrium. Vascular stents are noted in the left upper arm. Degenerative changes are noted in right shoulder. There is previous coronary bypass surgery. IMPRESSION: Small bilateral pleural effusions, more so on the left side with interval increase. Cardiomegaly. There are no signs of alveolar pulmonary edema. Linear densities in the left lower lung field may suggest subsegmental atelectasis. Electronically Signed   By: Ernie Avena M.D.   On: 12/14/2022 18:28        Scheduled Meds:  amiodarone  200 mg Oral BID   [START ON 12/23/2022] amiodarone  200 mg Oral Daily   calcitRIOL  1.75 mcg Oral Q M,W,F-HD   Chlorhexidine Gluconate Cloth  6 each Topical Q0600   cinacalcet  30 mg Oral Q M,W,F-HD   metoprolol tartrate  25 mg Oral BID   Continuous Infusions:     LOS: 2 days    Time spent: 35 minutes    Navpreet Szczygiel A Nubia Ziesmer, MD [facility]   If 7PM-7AM, please contact  night-coverage [email or web address]  12/16/2022, 1:05 PM

## 2022-12-16 NOTE — Progress Notes (Signed)
Received patient in bed to unit.  Alert and oriented.  Informed consent signed and in chart.   TX duration:3:08  Patient tolerated well.  Transported back to the room  Alert, without acute distress.  Hand-off given to patient's nurse.   Access used: left [facility] Access issues: none  Total UF removed: Medication(s) given: zofran, calcitriol   12/16/22 1055  Vitals  BP (!) 196/64  MAP (mmHg) 100  BP Location Right Arm  BP Method Automatic  Patient Position (if appropriate) Lying  Pulse Rate 64  Pulse Rate Source Monitor  ECG Heart Rate 65  Resp (!) 22  Oxygen Therapy  SpO2 96 %  O2 Device Room Air  During Treatment Monitoring  HD Safety Checks Performed Yes  Intra-Hemodialysis Comments  (tx ended due to cartridge clotting. about 22 minutes remaining in tx, blood rinsed back, Salome Holmes, PA notified)      Shawna [name] S [facility]

## 2022-12-16 NOTE — Progress Notes (Addendum)
Rounding Note    Patient Name: Shawna [name] Date of Encounter: 12/16/2022  [facility] Cardiologist: Dina Rich, MD   Subjective   Pt feeling much better today and does not feel as "yucky" after converting back to sinus bradycardia yesterday at approximately 8 in the morning.  No complaints today.  Currently getting dialysis today.  Inpatient Medications    Scheduled Meds:  calcitRIOL  1.75 mcg Oral Q M,W,F-HD   Chlorhexidine Gluconate Cloth  6 each Topical Q0600   cinacalcet  30 mg Oral Q M,W,F-HD   metoprolol tartrate  25 mg Oral BID   Continuous Infusions:  amiodarone 30 mg/hr (12/16/22 0102)   anticoagulant sodium citrate     PRN Meds: acetaminophen **OR** acetaminophen, alteplase, anticoagulant sodium citrate, guaiFENesin-dextromethorphan, heparin, lidocaine (PF), lidocaine-prilocaine, ondansetron **OR** ondansetron (ZOFRAN) IV, pentafluoroprop-tetrafluoroeth   Vital Signs    Vitals:   12/16/22 0445 12/16/22 0717 12/16/22 0721 12/16/22 0730  BP: (!) 166/56 (!) 163/67  (!) 180/71  Pulse: (!) 55 (!) 55  62  Resp: 17 (!) 25  20  Temp: 97.7 F (36.5 C) 98.3 F (36.8 C)    TempSrc: Oral Oral    SpO2: 98% 96%  100%  Weight: 57.4 kg  62.1 kg   Height:        Intake/Output Summary (Last 24 hours) at 12/16/2022 0741 Last data filed at 12/15/2022 1851 Gross per 24 hour  Intake 719.61 ml  Output 0 ml  Net 719.61 ml      12/16/2022    7:21 AM 12/16/2022    4:45 AM 12/14/2022   10:50 PM  Last 3 Weights  Weight (lbs) 136 lb 14.5 oz 126 lb 9.6 oz 132 lb 4.4 oz  Weight (kg) 62.1 kg 57.425 kg 60 kg      Telemetry    Sinus bradycardia with heart rates in the 50s and 60s.- Personally Reviewed  ECG    Will obtain EKG later today once patient is of dialysis- Personally Reviewed  Physical Exam   GEN: No acute distress.   Neck: No JVD Cardiac: RRR, no murmurs, rubs, or gallops.  Respiratory: Clear to auscultation bilaterally. GI: Soft, nontender,  non-distended  MS: No edema; No deformity. Neuro:  Nonfocal  Psych: Normal affect   Labs    High Sensitivity Troponin:   Recent Labs  Lab 12/14/22 1857  TROPONINIHS 66*     Chemistry Recent Labs  Lab 12/12/22 1404 12/13/22 0337 12/14/22 1857  NA 131* 132* 134*  K 4.6 3.9 3.5  CL 92* 94* 96*  CO2 27 25 26   GLUCOSE 106* 112* 120*  BUN 40* 14 10  CREATININE 7.59* 4.18* 2.95*  CALCIUM 8.7* 8.2* 8.0*  PROT 5.0* 5.1* 5.8*  ALBUMIN 2.4* 2.5* 2.8*  AST 13* 15 18  ALT 10 10 12   ALKPHOS 77 73 85  BILITOT 0.6 0.7 0.8  GFRNONAA 5* 10* 15*  ANIONGAP 12 13 12     Lipids No results for input(s): "CHOL", "TRIG", "HDL", "LABVLDL", "LDLCALC", "CHOLHDL" in the last 168 hours.  Hematology Recent Labs  Lab 12/12/22 1401 12/13/22 0337 12/14/22 1857  WBC 5.5 5.0 7.0  RBC 1.98* 2.09* 2.17*  HGB 7.1* 7.4* 7.7*  HCT 22.2* 22.7* 24.0*  MCV 112.1* 108.6* 110.6*  MCH 35.9* 35.4* 35.5*  MCHC 32.0 32.6 32.1  RDW 21.9* 21.5* 21.5*  PLT 234 210 259   Thyroid No results for input(s): "TSH", "FREET4" in the last 168 hours.  BNPNo results for input(s): "  BNP", "PROBNP" in the last 168 hours.  DDimer No results for input(s): "DDIMER" in the last 168 hours.   Radiology    DG Chest Port 1 View  Result Date: 12/14/2022 CLINICAL DATA:  Altered mental status tachycardia EXAM: PORTABLE CHEST 1 VIEW COMPARISON:  Previous studies including the examination of 12/04/2022 FINDINGS: Transverse diameter of heart is increased. There are no signs of alveolar pulmonary edema. Small bilateral pleural effusions are noted, more so on the left side with interval increase. Linear densities are seen in left lower lung field. There is no pneumothorax. Tip of left IJ dialysis catheter is seen in the region of junction of superior vena cava and right atrium. Vascular stents are noted in the left upper arm. Degenerative changes are noted in right shoulder. There is previous coronary bypass surgery. IMPRESSION: Small  bilateral pleural effusions, more so on the left side with interval increase. Cardiomegaly. There are no signs of alveolar pulmonary edema. Linear densities in the left lower lung field may suggest subsegmental atelectasis. Electronically Signed   By: Ernie Avena M.D.   On: 12/14/2022 18:28    Cardiac Studies   Left heart catheterization 07/22/2021   Mid LM to Dist LM lesion is 60% stenosed.   Prox LAD lesion is 90% stenosed.   Ost Cx to Mid Cx lesion is 99% stenosed.   Ost RCA to Prox RCA lesion is 100% stenosed.   Ost Ramus to Ramus lesion is 80% stenosed.   Ost 1st Diag to 1st Diag lesion is 100% stenosed.   Ost RPDA to RPDA lesion is 75% stenosed.   2nd Mrg lesion is 60% stenosed.   Dist LAD lesion is 70% stenosed.   LIMA and is large.   SVG and is large.   SVG and is normal in caliber.   SVG and is large.   The graft exhibits no disease.   The graft exhibits no disease.   The graft exhibits no disease.   LV end diastolic pressure is mildly elevated.   The left ventricular ejection fraction is 35-45% by visual estimate.   Echocardiogram 07/16/2021 1. Left ventricular ejection fraction, by estimation, is 30 to 35% . The left ventricle has moderately decreased function. The left ventricle demonstrates regional wall motion abnormalities ( see scoring diagram/ findings for description) . There is mild left ventricular hypertrophy. Left ventricular diastolic parameters are consistent with Grade II diastolic dysfunction ( pseudonormalization) . Elevated left atrial pressure. 2. Right ventricular systolic function is moderately reduced. The right ventricular size is normal. There is mildly elevated pulmonary artery systolic pressure. The estimated right ventricular systolic pressure is 43. 4 mmHg. 3. Left atrial size was severely dilated. 4. Right atrial size was mildly dilated. 5. The mitral valve is degenerative. Moderate mitral valve regurgitation. Mild to moderate mitral stenosis. MG  at 70bpm, MVA 1. 2 cm^ 2 by continuity equation. Moderate mitral annular calcification. 6. Tricuspid valve regurgitation is mild to moderate. 7. The aortic valve is tricuspid. Aortic valve regurgitation is not visualized. Aortic valve sclerosis/ calcification is present, without any evidence of aortic stenosis. 8. The inferior vena cava is normal in size with greater than 50% respiratory variability, suggesting right atrial pressure of 3 mmHg.  Patient Profile     82 y.o. female x of  CAD (s/p CABG in 2013, DES to D1 in 10/2016, cath in 07/2018 and 07/2021 managed medically), HTN, HLD, PAF/flutter (not on anticoagulation given history of GIB), chronic anemia, NSVT (treated with amiodarone), ESRD  on HD, chronic HFrEF, mitral regurgitation and stenosis/tricuspid regurgitation, LBBB, colon CA, esophageal stricture, carotid artery disease (<40% by duplex 2016), PUD, TIA, seizures, hypothyroidism, recurrent GI bleeding due to small bowel AVM,  who was admitted on 12/15/2022 for the evaluation of atrial tachycardia at the request of Dr. Sunnie Nielsen.    Assessment & Plan    Atrial tachycardia/reentry tachycardia Chronic LBBB History of NSVT Patient converted to NSR (currently sinus bradycardia with heart rates in the 50s) at approximately 0800 yesterday on 12/15/2022 after receiving Lopressor 25 mg now with improvement in symptoms.  Currently in dialysis right now so we will obtain EKG later today. Will discuss with MD plans for amiodarone Currently on Lopressor 25 mg twice daily.  Do not give with heart rates less than 60.  Mildly elevated troponins Troponins in the 60s, in the absence of any anginal symptoms and ESRD there is low suspicion for any acute events.  Chronic HFrEF GDMT significantly limited by patient's ESRD.  Currently PTA medications include carvedilol 6.25 mg, hydralazine 50 mg.  Will initiate PTA meds once baseline blood pressure can be better assessed.  CAD (s/p CABG in 2013, DES to D1  in 10/2016, cath in 07/2018 and 07/2021 managed medically) Has no anginal complaints.  Not on any antiplatelet medications due to history of recurrent GI bleeds.  Hypertension Patient's blood pressures are elevated today while in dialysis.  Would suspect blood pressures to improve.  Will reevaluate once dialysis is done.  History of atrial fibrillation/flutter Not on anticoagulation due to recurrent episodes of AVM hemorrhaging.  Maintaining sinus rhythm right now with heart rates in the 50s and 60s.  Hyperlipidemia Will obtain lipid panel for tomorrow.  Continue atorvastatin 10 mg  ESRD on HD Chronic anemia Seizures Recurrent GI bleeding due to small bowel AVM    For questions or updates, please contact [facility] Please consult [email or web address] for contact info under        Signed, Abagail Kitchens, PA-C  12/16/2022, 7:41 AM     Attending Note:   The patient was seen and examined.  Agree with assessment and plan as noted above.  Changes made to the above note as needed.  Patient seen and independently examined with Yvonna Alanis, PA .   We discussed all aspects of the encounter. I agree with the assessment and plan as stated above.    Atrial tach : I think we should continue amiodarone.  She has a known history of paroxysmal atrial flutter although this current admission was an atrial tachycardia.  Will give her amiodarone 200 mg twice a day for at least a week and then convert her to 200 mg once a day.  Continue metoprolol.  2.  Coronary artery disease: She status post coronary artery bypass grafting.  Her troponin levels were mildly elevated.  I suspect that this is in the setting of tachycardia from her atrial tachycardia and with ESRD.  She is not having any angina.  3.  Hyperlipidemia: Continue atorvastatin 10 mg a day.   I have spent a total of 40 minutes with patient reviewing hospital  notes , telemetry, EKGs, labs and examining patient as well as establishing an  assessment and plan that was discussed with the patient.  > 50% of time was spent in direct patient care.    Vesta Mixer, Montez Hageman., MD, [facility] 12/16/2022, 10:19 AM [address],  [address] Office [phone] Pager [phone]

## 2022-12-17 DIAGNOSIS — R000 Tachycardia, unspecified: Secondary | ICD-10-CM | POA: Diagnosis not present

## 2022-12-17 DIAGNOSIS — I5022 Chronic systolic (congestive) heart failure: Secondary | ICD-10-CM | POA: Diagnosis not present

## 2022-12-17 DIAGNOSIS — I4892 Unspecified atrial flutter: Secondary | ICD-10-CM | POA: Diagnosis not present

## 2022-12-17 LAB — LIPID PANEL
Cholesterol: 95 mg/dL (ref 0–200)
HDL: 46 mg/dL (ref >40)
LDL Cholesterol: 34 mg/dL (ref 0–99)
Total CHOL/HDL Ratio: 2.1 RATIO
Triglycerides: 77 mg/dL (ref <150)
VLDL: 15 mg/dL (ref 0–40)

## 2022-12-17 MED ORDER — BENZONATATE 100 MG PO CAPS
100.0000 mg | ORAL_CAPSULE | Freq: Three times a day (TID) | ORAL | Status: DC | PRN
  Administered 2022-12-17 – 2022-12-27 (×10): 100 mg via ORAL
  Filled 2022-12-17 (×10): qty 1

## 2022-12-17 NOTE — Progress Notes (Signed)
PROGRESS NOTE    Shawna [name]  ZOX:[id number] DOB: 1940/09/30 DOA: 12/14/2022 PCP: [facility]     Brief Narrative:  82 year old with past medical history significant for hypertension, hyperlipidemia, CAD status post CABG and PCI, paroxysmal A-fib, no longer on anticoagulation due to recurrent GI bleed, ESRD on hemodialysis (MWF), seizure disorder, hypothyroidism, history of pulmonary.  Presented to the ED complaining of chest pain while she was returning from dialysis the day of admission.  Reports chest pain sharp in nature, 10 out of 10 associated with palpitation.  Patient admission to [facility]/due to AVM, received blood transfusion and was treated with argon plasma hospitalization. Developed paroxysmal Atrial flutter which was difficult to control she was placed on amiodarone and Coreg.   New events last 24 hours / Subjective: Reports feeling better overall but still a little fatigued.  Currently on amiodarone and metoprolol.  Not on anticoagulation due to a GI bleed.  No blood in her stool.  No chest pain or shortness of breath.  She did come from SNF after recent hospitalization and will likely need to go back.  Assessment & Plan:   Principal Problem:   Paroxysmal atrial flutter (HCC)  Appreciate cardiology  Metoprolol titrate 25 mg twice daily  Amiodarone 200 mg twice daily for 7 days and then transition to 200 mg daily on 12/23/2022  Active Problems:   Mixed hyperlipidemia  Lipitor 10 mg daily    Essential hypertension  Currently stable    Wide-complex tachycardia  Currently resolved    End-stage renal disease on hemodialysis (HCC)  HD M/W/F  Sensipar and Rocalcitrol on dialysis days    Diabetes mellitus type 2 in nonobese (HCC)  Diet controlled   Acquired hypothyroidism  Levothyroxine 75 mcg daily    Macrocytic anemia  DVT prophylaxis: SCDs Code Status: Full Family Communication: None bedside Coming From: SNF Disposition Plan: SNF Barriers to  Discharge: SNF placement  Consultants:  Cardiology  Objective: Vitals:   12/17/22 0031 12/17/22 0441 12/17/22 0443 12/17/22 1000  BP: (!) 126/45 (!) 163/55  117/69  Pulse: (!) 54 (!) 51 68 99  Resp: 17 13 20 18   Temp: 98.2 F (36.8 C)  98.2 F (36.8 C) 97.9 F (36.6 C)  TempSrc: Oral  Oral Oral  SpO2: 95% 99% 100% 97%  Weight:   60.2 kg   Height:        Intake/Output Summary (Last 24 hours) at 12/17/2022 1206 Last data filed at 12/16/2022 2200 Gross per 24 hour  Intake 120 ml  Output --  Net 120 ml   Filed Weights   12/16/22 0721 12/16/22 1107 12/17/22 0443  Weight: 62.1 kg 62.9 kg 60.2 kg    Examination:  General exam: Appears calm and comfortable  Respiratory system: Clear to auscultation. Respiratory effort normal. No respiratory distress. No conversational dyspnea.  Cardiovascular system: S1 & S2 heard, regular rate, irregularly irregular rhythm Gastrointestinal system: Abdomen is nondistended, soft and nontender. Normal bowel sounds heard. Central nervous system: Alert and oriented. No focal neurological deficits. Speech clear.  Extremities: Symmetric in appearance  Skin: No rashes, lesions or ulcers on exposed skin  Psychiatry: Judgement and insight appear normal. Mood & affect appropriate.   Data Reviewed: I have personally reviewed following labs and imaging studies  CBC: Recent Labs  Lab 12/12/22 1401 12/13/22 0337 12/14/22 1857  WBC 5.5 5.0 7.0  NEUTROABS  --  3.8 5.6  HGB 7.1* 7.4* 7.7*  HCT 22.2* 22.7* 24.0*  MCV 112.1* 108.6*  110.6*  PLT 234 210 259   Basic Metabolic Panel: Recent Labs  Lab 12/11/22 0231 12/12/22 1404 12/13/22 0337 12/14/22 1857  NA 130* 131* 132* 134*  K 4.7 4.6 3.9 3.5  CL 93* 92* 94* 96*  CO2 23 27 25 26   GLUCOSE 88 106* 112* 120*  BUN 28* 40* 14 10  CREATININE 5.28* 7.59* 4.18* 2.95*  CALCIUM 8.3* 8.7* 8.2* 8.0*   GFR: Estimated Creatinine Clearance: 12.6 mL/min (A) (by C-G formula based on SCr of 2.95 mg/dL  (H)).  Liver Function Tests: Recent Labs  Lab 12/11/22 0231 12/12/22 1404 12/13/22 0337 12/14/22 1857  AST 18 13* 15 18  ALT 9 10 10 12   ALKPHOS 60 77 73 85  BILITOT 0.8 0.6 0.7 0.8  PROT 4.7* 5.0* 5.1* 5.8*  ALBUMIN 2.3* 2.4* 2.5* 2.8*   Lipid Profile: Recent Labs    12/17/22 0121  CHOL 95  HDL 46  LDLCALC 34  TRIG 77  CHOLHDL 2.1   Anemia Panel: Recent Labs    12/16/22 0727  FOLATE 37.9   Scheduled Meds:  amiodarone  200 mg Oral BID   [START ON 12/23/2022] amiodarone  200 mg Oral Daily   atorvastatin  10 mg Oral QHS   calcitRIOL  1.75 mcg Oral Q M,W,F-HD   Chlorhexidine Gluconate Cloth  6 each Topical Q0600   cinacalcet  30 mg Oral Q M,W,F-HD   gabapentin  100 mg Oral QPM   levETIRAcetam  500 mg Oral Daily   levothyroxine  75 mcg Oral QAC breakfast   metoprolol tartrate  25 mg Oral BID   Continuous Infusions:   LOS: 3 days    Time spent: 30 minutes   Sharlene Dory, DO [facility] 12/17/2022, 12:06 PM   Available via Epic secure chat 7am-7pm After these hours, please refer to coverage provider listed on [email or web address]

## 2022-12-17 NOTE — Progress Notes (Signed)
Cardiology Progress Note  Patient ID: Shawna [name] MRN: [id number] DOB: 1941-05-09 Date of Encounter: 12/17/2022  Primary Cardiologist: Dina Rich, MD  Subjective   Chief Complaint: None.   HPI: Appears to be in atrial fibrillation.  Rate controlled.  No further atrial tachycardia noted.  Rates are controlled.  ROS:  All other ROS reviewed and negative. Pertinent positives noted in the HPI.     Inpatient Medications  Scheduled Meds:  amiodarone  200 mg Oral BID   [START ON 12/23/2022] amiodarone  200 mg Oral Daily   atorvastatin  10 mg Oral QHS   calcitRIOL  1.75 mcg Oral Q M,W,F-HD   Chlorhexidine Gluconate Cloth  6 each Topical Q0600   cinacalcet  30 mg Oral Q M,W,F-HD   gabapentin  100 mg Oral QPM   levETIRAcetam  500 mg Oral Daily   levothyroxine  75 mcg Oral QAC breakfast   metoprolol tartrate  25 mg Oral BID   Continuous Infusions:  PRN Meds: acetaminophen **OR** acetaminophen, ALPRAZolam, guaiFENesin-dextromethorphan, ondansetron **OR** ondansetron (ZOFRAN) IV   Vital Signs   Vitals:   12/16/22 1949 12/17/22 0031 12/17/22 0441 12/17/22 0443  BP: (!) 166/73 (!) 126/45 (!) 163/55   Pulse: 62 (!) 54 (!) 51 68  Resp: 14 17 13 20   Temp: 98.6 F (37 C) 98.2 F (36.8 C)  98.2 F (36.8 C)  TempSrc: Oral Oral  Oral  SpO2: 97% 95% 99% 100%  Weight:    60.2 kg  Height:        Intake/Output Summary (Last 24 hours) at 12/17/2022 1006 Last data filed at 12/16/2022 2200 Gross per 24 hour  Intake 374.29 ml  Output 700 ml  Net -325.71 ml      12/17/2022    4:43 AM 12/16/2022   11:07 AM 12/16/2022    7:21 AM  Last 3 Weights  Weight (lbs) 132 lb 11.5 oz 138 lb 10.7 oz 136 lb 14.5 oz  Weight (kg) 60.2 kg 62.9 kg 62.1 kg      Telemetry  Overnight telemetry shows paroxysmal A-fib heart rate 80 to 90 bpm, which I personally reviewed.    Physical Exam   Vitals:   12/16/22 1949 12/17/22 0031 12/17/22 0441 12/17/22 0443  BP: (!) 166/73 (!) 126/45 (!) 163/55    Pulse: 62 (!) 54 (!) 51 68  Resp: 14 17 13 20   Temp: 98.6 F (37 C) 98.2 F (36.8 C)  98.2 F (36.8 C)  TempSrc: Oral Oral  Oral  SpO2: 97% 95% 99% 100%  Weight:    60.2 kg  Height:        Intake/Output Summary (Last 24 hours) at 12/17/2022 1006 Last data filed at 12/16/2022 2200 Gross per 24 hour  Intake 374.29 ml  Output 700 ml  Net -325.71 ml       12/17/2022    4:43 AM 12/16/2022   11:07 AM 12/16/2022    7:21 AM  Last 3 Weights  Weight (lbs) 132 lb 11.5 oz 138 lb 10.7 oz 136 lb 14.5 oz  Weight (kg) 60.2 kg 62.9 kg 62.1 kg    Body mass index is 24.27 kg/m.  General: Well nourished, well developed, in no acute distress Head: Atraumatic, normal size  Eyes: PEERLA, EOMI  Neck: Supple, no JVD Endocrine: No thryomegaly Cardiac: Normal S1, S2; irregular rhythm, no murmurs rubs or gallops Lungs: Clear to auscultation bilaterally, no wheezing, rhonchi or rales  Abd: Soft, nontender, no hepatomegaly  Ext: No edema, pulses 2+  Musculoskeletal: No deformities, BUE and BLE strength normal and equal Skin: Warm and dry, no rashes   Neuro: Alert and oriented to person, place, time, and situation, CNII-XII grossly intact, no focal deficits  Psych: Normal mood and affect   Labs  High Sensitivity Troponin:   Recent Labs  Lab 12/14/22 1857  TROPONINIHS 66*     Cardiac EnzymesNo results for input(s): "TROPONINI" in the last 168 hours. No results for input(s): "TROPIPOC" in the last 168 hours.  Chemistry Recent Labs  Lab 12/12/22 1404 12/13/22 0337 12/14/22 1857  NA 131* 132* 134*  K 4.6 3.9 3.5  CL 92* 94* 96*  CO2 27 25 26   GLUCOSE 106* 112* 120*  BUN 40* 14 10  CREATININE 7.59* 4.18* 2.95*  CALCIUM 8.7* 8.2* 8.0*  PROT 5.0* 5.1* 5.8*  ALBUMIN 2.4* 2.5* 2.8*  AST 13* 15 18  ALT 10 10 12   ALKPHOS 77 73 85  BILITOT 0.6 0.7 0.8  GFRNONAA 5* 10* 15*  ANIONGAP 12 13 12     Hematology Recent Labs  Lab 12/12/22 1401 12/13/22 0337 12/14/22 1857  WBC 5.5 5.0 7.0  RBC  1.98* 2.09* 2.17*  HGB 7.1* 7.4* 7.7*  HCT 22.2* 22.7* 24.0*  MCV 112.1* 108.6* 110.6*  MCH 35.9* 35.4* 35.5*  MCHC 32.0 32.6 32.1  RDW 21.9* 21.5* 21.5*  PLT 234 210 259   BNPNo results for input(s): "BNP", "PROBNP" in the last 168 hours.  DDimer No results for input(s): "DDIMER" in the last 168 hours.   Radiology  No results found.  Cardiac Studies   TTE 07/16/2021 1. Left ventricular ejection fraction, by estimation, is 30 to 35%. The  left ventricle has moderately decreased function. The left ventricle  demonstrates regional wall motion abnormalities (see scoring  diagram/findings for description). There is mild  left ventricular hypertrophy. Left ventricular diastolic parameters are  consistent with Grade II diastolic dysfunction (pseudonormalization).  Elevated left atrial pressure.   2. Right ventricular systolic function is moderately reduced. The right  ventricular size is normal. There is mildly elevated pulmonary artery  systolic pressure. The estimated right ventricular systolic pressure is  43.4 mmHg.   3. Left atrial size was severely dilated.   4. Right atrial size was mildly dilated.   5. The mitral valve is degenerative. Moderate mitral valve regurgitation.  Mild to moderate mitral stenosis. MG at 70bpm, MVA 1.2 cm^2 by  continuity equation. Moderate mitral annular calcification.   6. Tricuspid valve regurgitation is mild to moderate.   7. The aortic valve is tricuspid. Aortic valve regurgitation is not  visualized. Aortic valve sclerosis/calcification is present, without any  evidence of aortic stenosis.   8. The inferior vena cava is normal in size with greater than 50%  respiratory variability, suggesting right atrial pressure of 3 mmHg.   LHC 07/22/2021   Mid LM to Dist LM lesion is 60% stenosed.   Prox LAD lesion is 90% stenosed.   Ost Cx to Mid Cx lesion is 99% stenosed.   Ost RCA to Prox RCA lesion is 100% stenosed.   Ost Ramus to Ramus lesion  is 80% stenosed.   Ost 1st Diag to 1st Diag lesion is 100% stenosed.   Ost RPDA to RPDA lesion is 75% stenosed.   2nd Mrg lesion is 60% stenosed.   Dist LAD lesion is 70% stenosed.   LIMA and is large.   SVG and is large.   SVG and is normal in caliber.   SVG and  is large.   The graft exhibits no disease.   The graft exhibits no disease.   The graft exhibits no disease.   LV end diastolic pressure is mildly elevated.   The left ventricular ejection fraction is 35-45% by visual estimate.     Patient Profile  Shawna [name] is a 82 y.o. female with CAD status post CABG, ESRD hemodialysis, systolic heart failure, left bundle branch block, paroxysmal atrial fibrillation/flutter not on anticoagulation, history of GI bleed, TIA admitted on 12/14/2022 with chest pain and atrial tachycardia.  Assessment & Plan   # Atrial tachycardia # Paroxysmal atrial fibrillation/flutter -Admitted with wide-complex tachycardia.  Axis is not changed.  This is just related to her left bundle branch block.  Suspicious for atrial tachycardia versus atrial flutter. -Agree with rhythm control strategy with amiodarone.  Continue 200 mg twice daily for 7 days and then transition to 200 mg daily.  She has limited options for rhythm control strategies given ESRD. -Continue metoprolol to tartrate 25 mg twice daily. -Not on anticoagulation due to prior history of GI bleed.  # Chronic systolic heart failure, EF 30-35% # CAD status post CABG, residual small vessel disease # Chest pain, demand ischemia -Known history of CABG.  Vein grafts are patent however she does have residual small vessel disease that is being managed medically.  Suspect her angina was related to tachycardia.  Seems to be much improved.  Rhythm control strategy as above. -Does not appear to be on aspirin due to history of GI bleed.  Would continue statin.  # ESRD on HD -volume management per nephrology   [facility] will sign off.    Medication Recommendations:  As above Other recommendations (labs, testing, etc):  none Follow up as an outpatient:  2-4 weeks with Dr. Wyline Mood  For questions or updates, please contact [facility] Please consult [email or web address] for contact info under        Signed, Gerri Spore T. Flora Lipps, MD, [facility]  [facility]  12/17/2022 10:06 AM

## 2022-12-17 NOTE — Progress Notes (Signed)
[facility] Progress Note   Subjective:    Seen and examined patient at bedside. Informed yesterday HD machine clotted off near end of treatment. Noted removed. She reports ongoing fatigue. Next HD 6/10. More likely will return back to SNF upon discharge.  Objective Vitals:   12/17/22 0441 12/17/22 0443 12/17/22 1000 12/17/22 1014  BP: (!) 163/55  117/69 117/69  Pulse: (!) 51 68 99 (!) 103  Resp: 13 20 18 20   Temp:  98.2 F (36.8 C) 97.9 F (36.6 C)   TempSrc:  Oral Oral   SpO2: 99% 100% 97% 97%  Weight:  60.2 kg    Height:       Physical Exam General: Elderly woman; appears sleepy but responds to voice; NAD Heart: Irreg; No murmurs, gallops, or rubs Lungs: Clear bilaterally Abdomen: Soft and non-tender Extremities: No LE edema Dialysis Access: [facility]   Filed Weights   12/16/22 0721 12/16/22 1107 12/17/22 0443  Weight: 62.1 kg 62.9 kg 60.2 kg    Intake/Output Summary (Last 24 hours) at 12/17/2022 1310 Last data filed at 12/16/2022 2200 Gross per 24 hour  Intake 120 ml  Output --  Net 120 ml    Additional Objective Labs: Basic Metabolic Panel: Recent Labs  Lab 12/12/22 1404 12/13/22 0337 12/14/22 1857  NA 131* 132* 134*  K 4.6 3.9 3.5  CL 92* 94* 96*  CO2 27 25 26   GLUCOSE 106* 112* 120*  BUN 40* 14 10  CREATININE 7.59* 4.18* 2.95*  CALCIUM 8.7* 8.2* 8.0*   Liver Function Tests: Recent Labs  Lab 12/12/22 1404 12/13/22 0337 12/14/22 1857  AST 13* 15 18  ALT 10 10 12   ALKPHOS 77 73 85  BILITOT 0.6 0.7 0.8  PROT 5.0* 5.1* 5.8*  ALBUMIN 2.4* 2.5* 2.8*   No results for input(s): "LIPASE", "AMYLASE" in the last 168 hours. CBC: Recent Labs  Lab 12/12/22 1401 12/13/22 0337 12/14/22 1857  WBC 5.5 5.0 7.0  NEUTROABS  --  3.8 5.6  HGB 7.1* 7.4* 7.7*  HCT 22.2* 22.7* 24.0*  MCV 112.1* 108.6* 110.6*  PLT 234 210 259   Blood Culture    Component Value Date/Time   SDES BLOOD LEFT HAND 12/04/2022 2013   SPECREQUEST  12/04/2022  2013    BOTTLES DRAWN AEROBIC AND ANAEROBIC Blood Culture adequate volume   CULT  12/04/2022 2013    NO GROWTH 5 DAYS Performed at [facility], [address] [place], [place] [address]    REPTSTATUS 12/09/2022 FINAL 12/04/2022 2013    Cardiac Enzymes: No results for input(s): "CKTOTAL", "CKMB", "CKMBINDEX", "TROPONINI" in the last 168 hours. CBG: No results for input(s): "GLUCAP" in the last 168 hours. Iron Studies: No results for input(s): "IRON", "TIBC", "TRANSFERRIN", "FERRITIN" in the last 72 hours. Lab Results  Component Value Date   INR 1.3 (H) 11/08/2021   INR 1.0 02/03/2021   INR 1.1 06/12/2020   Studies/Results: No results found.  Medications:   amiodarone  200 mg Oral BID   [START ON 12/23/2022] amiodarone  200 mg Oral Daily   atorvastatin  10 mg Oral QHS   calcitRIOL  1.75 mcg Oral Q M,W,F-HD   Chlorhexidine Gluconate Cloth  6 each Topical Q0600   cinacalcet  30 mg Oral Q M,W,F-HD   gabapentin  100 mg Oral QPM   levETIRAcetam  500 mg Oral Daily   levothyroxine  75 mcg Oral QAC breakfast   metoprolol tartrate  25 mg Oral BID  Dialysis Orders: [facility] EDW 58.5kg TDC 2K 2.5Ca BFR 300 DFR 500 Calcitrol 1.8mcg PO q HD Sensipar 30mg  PO q HD Mircera 200 mcg IV q 2 weeks- Last dose 11/28/22  Assessment/Plan: 1.  Paroxysmal Afib - Cardiology following; Continue Metoprolol and Amiodarone. Not on AC d/t recent history GIB 2.  End-stage renal disease: Usually on a Monday/Wednesday/Friday schedule. Informed HD clotted off yesterday. Pt with recent hx of GIB. May need to use tight heparin with HD to prevent ongoing clotting. Next HD 6/10. 3.  Secondary hyperparathyroidism: Checking a phos in AM. Will restart Sevelamer if indicated. Continue calcitriol and Sensipar TIW 4.  Hypertension: Blood pressures variable. On Metoprolol. UF as tolerated. 5. Anemia - Hgb 7.7. Will check iron studies. 6. Dispo: inpatient  Salome Holmes, NP [facility] 12/17/2022,1:10 PM  LOS: 3 days

## 2022-12-17 NOTE — Evaluation (Signed)
Occupational Therapy Evaluation Patient Details Name: Shawna [name] MRN: [id number] DOB: 29-Sep-1940 Today's Date: 12/17/2022   History of Present Illness 82 yo female presenting 12/14/22 from [facility] due to CP after HD. Admitted with atrial tachycardia. Recently admitted 11/30/22-12/13/22 for rectal bleed. PMH ESRD on HD MWF, seizure disorder, afib, HTN, HLD, CAD s/p CABG and PCI, hypothyroidism, , Small bowel AVMS, GIB, Hx of colon CA, TIA, PUD, Gout.   Clinical Impression   Pt presents with problem above with deficits listed below. Pt recently discharged to a SNF for rehabilitation and was there prior to this admission. Requiring up to min A for short distance ambulation. Currently limited by activity tolerance, strength, and balance. Recommending inpatient rehab<3 hours per day. Pt reports husband can assist at home if she can progress to do so, but baseline cognitive difficulty, so unsure of accuracy of this report. Will continue to follow.      Recommendations for follow up therapy are one component of a multi-disciplinary discharge planning process, led by the attending physician.  Recommendations may be updated based on patient status, additional functional criteria and insurance authorization.   Assistance Recommended at Discharge Frequent or constant Supervision/Assistance  Patient can return home with the following Assistance with cooking/housework;Direct supervision/assist for medications management;Direct supervision/assist for financial management;Assist for transportation;Help with stairs or ramp for entrance;A little help with walking and/or transfers;A little help with bathing/dressing/bathroom;A lot of help with bathing/dressing/bathroom    Functional Status Assessment  Patient has had a recent decline in their functional status and demonstrates the ability to make significant improvements in function in a reasonable and predictable amount of time.  Equipment Recommendations   None recommended by OT    Recommendations for Other Services       Precautions / Restrictions Precautions Precautions: Fall;Other (comment) Precaution Comments: watch HR Restrictions Weight Bearing Restrictions: No      Mobility Bed Mobility Overal bed mobility: Needs Assistance Bed Mobility: Supine to Sit, Sit to Supine     Supine to sit: Min guard, HOB elevated Sit to supine: Min guard, HOB elevated   General bed mobility comments: Extra time and effort with use of bed rails and HOB elevated, min guard for safety    Transfers Overall transfer level: Needs assistance Equipment used: Rolling walker (2 wheels) Transfers: Sit to/from Stand Sit to Stand: Min guard           General transfer comment: Min guard for safety to stand from EOB, no LOB      Balance Overall balance assessment: Needs assistance Sitting-balance support: Feet supported Sitting balance-Leahy Scale: Fair Sitting balance - Comments: Supervision sitting EOB statically   Standing balance support: Bilateral upper extremity supported, During functional activity, Reliant on assistive device for balance Standing balance-Leahy Scale: Poor Standing balance comment: Reliant on RW                           ADL either performed or assessed with clinical judgement   ADL Overall ADL's : Needs assistance/impaired Eating/Feeding: Independent;Sitting   Grooming: Wash/dry hands;Oral care;Supervision/safety;Cueing for safety;Sitting   Upper Body Bathing: Set up;Sitting   Lower Body Bathing: Minimal assistance;Sit to/from stand Lower Body Bathing Details (indicate cue type and reason): simulated     Lower Body Dressing: Minimal assistance;Sit to/from stand   Toilet Transfer: Minimal assistance;Rolling walker (2 wheels);Comfort height toilet           Functional mobility during ADLs: Min guard;Minimal assistance  Vision Baseline Vision/History: 1 Wears glasses Ability to See in  Adequate Light: 0 Adequate Patient Visual Report: No change from baseline Vision Assessment?: No apparent visual deficits     Perception     Praxis      Pertinent Vitals/Pain Pain Assessment Pain Assessment: Faces Faces Pain Scale: Hurts a little bit Pain Location: fatigue discomfort Pain Descriptors / Indicators: Discomfort Pain Intervention(s): Limited activity within patient's tolerance, Monitored during session     Hand Dominance Right   Extremity/Trunk Assessment Upper Extremity Assessment Upper Extremity Assessment: Generalized weakness   Lower Extremity Assessment Lower Extremity Assessment: Defer to PT evaluation   Cervical / Trunk Assessment Cervical / Trunk Assessment: Kyphotic   Communication Communication Communication: No difficulties   Cognition Arousal/Alertness: Awake/alert Behavior During Therapy: WFL for tasks assessed/performed Overall Cognitive Status: History of cognitive impairments - at baseline                                 General Comments: Pt alert and able to follow commands. Tangential speech at times to delay progressing mobility due to endurance deficits as well as not wanting to engage in incentive spirometer, etc. Unable to recall having walked with PT ~15 minutes prior.     General Comments  initially needing max multimodal cues to demo use of IS    Exercises     Shoulder Instructions      Home Living Family/patient expects to be discharged to:: Private residence Living Arrangements: Spouse/significant other Available Help at Discharge: Family;Available 24 hours/day Type of Home: House Home Access: Stairs to enter [employer] of Steps: 2 Entrance Stairs-Rails: Right Home Layout: One level     Bathroom Shower/Tub: Chief Strategy Officer: Standard Bathroom Accessibility: Yes   Home Equipment: Rollator (4 wheels);Cane - single point;Shower seat;BSC/3in1          Prior  Functioning/Environment Prior Level of Function : Needs assist             Mobility Comments: Pt typically uses her hurri-cane to ambulate in house but will occasionally use rollator.  Spouse assist with stairs. ADLs Comments: Spouse provides min A for ADLs and total assist for IADLs. Likes to garden        OT Problem List: Decreased strength;Decreased range of motion;Decreased activity tolerance;Impaired balance (sitting and/or standing);Decreased cognition;Decreased safety awareness;Cardiopulmonary status limiting activity      OT Treatment/Interventions: Self-care/ADL training;Therapeutic exercise;Energy conservation;DME and/or AE instruction;Therapeutic activities;Patient/family education;Balance training    OT Goals(Current goals can be found in the care plan section) Acute Rehab OT Goals Patient Stated Goal: "I want to go home instead of rehab" OT Goal Formulation: With patient Time For Goal Achievement: 12/31/22 Potential to Achieve Goals: Good  OT Frequency: Min 2X/week    Co-evaluation              AM-PAC OT "6 Clicks" Daily Activity     Outcome Measure Help from another person eating meals?: None Help from another person taking care of personal grooming?: A Little Help from another person toileting, which includes using toliet, bedpan, or urinal?: A Lot Help from another person bathing (including washing, rinsing, drying)?: A Lot Help from another person to put on and taking off regular upper body clothing?: A Little Help from another person to put on and taking off regular lower body clothing?: A Lot 6 Click Score: 16   End of Session Equipment Utilized During Treatment:  Gait belt;Rolling walker (2 wheels) Nurse Communication: Mobility status  Activity Tolerance: Patient tolerated treatment well Patient left: with call bell/phone within reach;in bed;with bed alarm set  OT Visit Diagnosis: Unsteadiness on feet (R26.81);Other abnormalities of gait and mobility  (R26.89);Muscle weakness (generalized) (M62.81);Pain Pain - Right/Left: Right                Time: 1020-1040 OT Time Calculation (min): 20 min Charges:  OT General Charges $OT Visit: 1 Visit OT Evaluation $OT Eval Moderate Complexity: 1 Mod  Tyler Deis, OTR/L [facility] Acute Rehabilitation Office: [phone]   Myrla Halsted 12/17/2022, 11:29 AM

## 2022-12-17 NOTE — Evaluation (Signed)
Physical Therapy Evaluation Patient Details Name: Shawna [name] MRN: [id number] DOB: 06/01/1941 Today's Date: 12/17/2022  History of Present Illness  82 yo female presenting 12/14/22 from [facility] due to CP after HD. Admitted with atrial tachycardia. Recently admitted 11/30/22-12/13/22 for rectal bleed. PMH ESRD on HD MWF, seizure disorder, afib, HTN, HLD, CAD s/p CABG and PCI, hypothyroidism, , Small bowel AVMS, GIB, Hx of colon CA, TIA, PUD, Gout.   Clinical Impression  Pt presents with condition above and deficits mentioned below, see PT Problem List. Pt just recently discharged to a SNF for short-term rehab after her recent admission. Currently, pt is requiring min guard for bed mobility and transfers and minA to ambulate short bedroom distances with RW support. She displays deficits in gross strength, endurance, and balance and is at risk for falls. She could benefit from short-term inpatient rehab, <3 hours/day. Pt reporting the prior SNF was "too cold" due to overhead fans which they could/would not turn off and pt may reporting preference for a warmer room or different facility due to this. Will continue to follow acutely.     Recommendations for follow up therapy are one component of a multi-disciplinary discharge planning process, led by the attending physician.  Recommendations may be updated based on patient status, additional functional criteria and insurance authorization.  Follow Up Recommendations Can patient physically be transported by private vehicle: Yes     Assistance Recommended at Discharge Frequent or constant Supervision/Assistance  Patient can return home with the following  A little help with walking and/or transfers;A little help with bathing/dressing/bathroom;Assistance with cooking/housework;Help with stairs or ramp for entrance;Assist for transportation;Direct supervision/assist for medications management;Direct supervision/assist for financial management     Equipment Recommendations None recommended by PT  Recommendations for Other Services       Functional Status Assessment Patient has had a recent decline in their functional status and demonstrates the ability to make significant improvements in function in a reasonable and predictable amount of time.     Precautions / Restrictions Precautions Precautions: Fall;Other (comment) Precaution Comments: watch HR Restrictions Weight Bearing Restrictions: No      Mobility  Bed Mobility Overal bed mobility: Needs Assistance Bed Mobility: Supine to Sit, Sit to Supine     Supine to sit: Min guard, HOB elevated Sit to supine: Min guard, HOB elevated   General bed mobility comments: Extra time and effort with use of bed rails and HOB elevated, min guard for safety    Transfers Overall transfer level: Needs assistance Equipment used: Rolling walker (2 wheels) Transfers: Sit to/from Stand Sit to Stand: Min guard           General transfer comment: Min guard for safety to stand from EOB, no LOB    Ambulation/Gait Ambulation/Gait assistance: Editor, commissioning (Feet): 12 Feet Assistive device: Rolling walker (2 wheels) Gait Pattern/deviations: Step-through pattern, Decreased stride length, Trunk flexed, Shuffle Gait velocity: reduced Gait velocity interpretation: <1.31 ft/sec, indicative of household ambulator   General Gait Details: Pt with slow, small, shuffling steps and flexed posture, needing minA intermittently for balance and minA to guide RW to advance it when turning.  Stairs            Wheelchair Mobility    Modified Rankin (Stroke Patients Only)       Balance Overall balance assessment: Needs assistance Sitting-balance support: Feet supported Sitting balance-Leahy Scale: Fair Sitting balance - Comments: Supervision sitting EOB statically   Standing balance support: Bilateral upper extremity supported,  During functional activity, Reliant on  assistive device for balance Standing balance-Leahy Scale: Poor Standing balance comment: Reliant on RW                             Pertinent Vitals/Pain Pain Assessment Pain Assessment: Faces Faces Pain Scale: Hurts a little bit Pain Location: fatigue discomfort Pain Descriptors / Indicators: Discomfort Pain Intervention(s): Limited activity within patient's tolerance, Monitored during session, Repositioned    Home Living Family/patient expects to be discharged to:: Private residence Living Arrangements: Spouse/significant other Available Help at Discharge: Family;Available 24 hours/day Type of Home: House Home Access: Stairs to enter Entrance Stairs-Rails: Right Entrance Stairs-Number of Steps: 2   Home Layout: One level Home Equipment: Rollator (4 wheels);Cane - single point;Shower seat;BSC/3in1      Prior Function Prior Level of Function : Needs assist             Mobility Comments: Pt typically uses her hurri-cane to ambulate in house but will occasionally use rollator.  Spouse assist with stairs. ADLs Comments: Spouse provides min A for ADLs and total assist for IADLs. Likes to garden     Higher education careers adviser   Dominant Hand: Right    Extremity/Trunk Assessment   Upper Extremity Assessment Upper Extremity Assessment: Defer to OT evaluation    Lower Extremity Assessment Lower Extremity Assessment: Generalized weakness    Cervical / Trunk Assessment Cervical / Trunk Assessment: Kyphotic  Communication   Communication: No difficulties  Cognition Arousal/Alertness: Awake/alert Behavior During Therapy: WFL for tasks assessed/performed Overall Cognitive Status: History of cognitive impairments - at baseline                                 General Comments: Pt alert and able to follow commands. Tangential speech at times to delay progressing mobility due to endurance deficits.        General Comments General comments (skin integrity,  edema, etc.): HR up to 100s, SpO2 >/= 93% on RA; provided IS to OT to educate pt on use    Exercises     Assessment/Plan    PT Assessment Patient needs continued PT services  PT Problem List Decreased strength;Cardiopulmonary status limiting activity;Decreased activity tolerance;Decreased balance;Decreased mobility;Decreased cognition;Decreased knowledge of use of DME       PT Treatment Interventions DME instruction;Gait training;Balance training;Stair training;Functional mobility training;Therapeutic activities;Patient/family education;Therapeutic exercise;Neuromuscular re-education;Cognitive remediation    PT Goals (Current goals can be found in the Care Plan section)  Acute Rehab PT Goals Patient Stated Goal: to improve her endurance PT Goal Formulation: With patient Time For Goal Achievement: 12/31/22 Potential to Achieve Goals: Good    Frequency Min 1X/week     Co-evaluation               AM-PAC PT "6 Clicks" Mobility  Outcome Measure Help needed turning from your back to your side while in a flat bed without using bedrails?: A Little Help needed moving from lying on your back to sitting on the side of a flat bed without using bedrails?: A Little Help needed moving to and from a bed to a chair (including a wheelchair)?: A Little Help needed standing up from a chair using your arms (e.g., wheelchair or bedside chair)?: A Little Help needed to walk in hospital room?: Total Help needed climbing 3-5 steps with a railing? : Total 6 Click Score: 14  End of Session Equipment Utilized During Treatment: Gait belt Activity Tolerance: Patient tolerated treatment well Patient left: in bed;with call bell/phone within reach;with bed alarm set Nurse Communication: Mobility status;Other (comment) (pt reported feeling worse as day has progressed and a cough) PT Visit Diagnosis: Other abnormalities of gait and mobility (R26.89);Muscle weakness (generalized) (M62.81);Difficulty in  walking, not elsewhere classified (R26.2);Unsteadiness on feet (R26.81)    Time: 1610-9604 PT Time Calculation (min) (ACUTE ONLY): 21 min   Charges:   PT Evaluation $PT Eval Moderate Complexity: 1 Mod          Raymond Gurney, PT, DPT Acute Rehabilitation Services  Office: [phone]   Jewel Baize 12/17/2022, 10:18 AM

## 2022-12-18 DIAGNOSIS — I4892 Unspecified atrial flutter: Secondary | ICD-10-CM | POA: Diagnosis not present

## 2022-12-18 LAB — BASIC METABOLIC PANEL
Anion gap: 13 (ref 5–15)
BUN: 18 mg/dL (ref 8–23)
CO2: 27 mmol/L (ref 22–32)
Calcium: 8.5 mg/dL — ABNORMAL LOW (ref 8.9–10.3)
Chloride: 91 mmol/L — ABNORMAL LOW (ref 98–111)
Creatinine, Ser: 5.31 mg/dL — ABNORMAL HIGH (ref 0.44–1.00)
GFR, Estimated: 8 mL/min — ABNORMAL LOW (ref >60)
Glucose, Bld: 100 mg/dL — ABNORMAL HIGH (ref 70–99)
Potassium: 3.9 mmol/L (ref 3.5–5.1)
Sodium: 131 mmol/L — ABNORMAL LOW (ref 135–145)

## 2022-12-18 LAB — CBC
HCT: 23.3 % — ABNORMAL LOW (ref 36.0–46.0)
Hemoglobin: 7.5 g/dL — ABNORMAL LOW (ref 12.0–15.0)
MCH: 35.5 pg — ABNORMAL HIGH (ref 26.0–34.0)
MCHC: 32.2 g/dL (ref 30.0–36.0)
MCV: 110.4 fL — ABNORMAL HIGH (ref 80.0–100.0)
Platelets: 236 10*3/uL (ref 150–400)
RBC: 2.11 MIL/uL — ABNORMAL LOW (ref 3.87–5.11)
RDW: 21.4 % — ABNORMAL HIGH (ref 11.5–15.5)
WBC: 5.6 10*3/uL (ref 4.0–10.5)
nRBC: 0 % (ref 0.0–0.2)

## 2022-12-18 LAB — IRON AND TIBC
Iron: 118 ug/dL (ref 28–170)
Saturation Ratios: 88 % — ABNORMAL HIGH (ref 10.4–31.8)
TIBC: 134 ug/dL — ABNORMAL LOW (ref 250–450)
UIBC: 16 ug/dL

## 2022-12-18 LAB — FERRITIN: Ferritin: 1398 ng/mL — ABNORMAL HIGH (ref 11–307)

## 2022-12-18 LAB — PHOSPHORUS: Phosphorus: 3.2 mg/dL (ref 2.5–4.6)

## 2022-12-18 MED ORDER — LIDOCAINE HCL (PF) 1 % IJ SOLN: 5.0000 mL | INTRAMUSCULAR | Status: DC | PRN

## 2022-12-18 MED ORDER — ALTEPLASE 2 MG IJ SOLR: 2.0000 mg | Freq: Once | INTRAMUSCULAR | Status: DC | PRN

## 2022-12-18 MED ORDER — PENTAFLUOROPROP-TETRAFLUOROETH EX AERO: 1.0000 | INHALATION_SPRAY | CUTANEOUS | Status: DC | PRN

## 2022-12-18 MED ORDER — HEPARIN SODIUM (PORCINE) 1000 UNIT/ML DIALYSIS
1000.0000 [IU] | INTRAMUSCULAR | Status: DC | PRN
  Administered 2022-12-19: 3800 [IU] via INTRAVENOUS_CENTRAL
  Filled 2022-12-18 (×2): qty 1

## 2022-12-18 MED ORDER — LIDOCAINE-PRILOCAINE 2.5-2.5 % EX CREA: 1.0000 | TOPICAL_CREAM | CUTANEOUS | Status: DC | PRN

## 2022-12-18 MED ORDER — DARBEPOETIN ALFA 150 MCG/0.3ML IJ SOSY
150.0000 ug | PREFILLED_SYRINGE | INTRAMUSCULAR | Status: DC
  Administered 2022-12-19 – 2022-12-26 (×2): 150 ug via SUBCUTANEOUS
  Filled 2022-12-18 (×3): qty 0.3

## 2022-12-18 NOTE — Progress Notes (Signed)
PROGRESS NOTE    Shawna [name]  ZOX:[id number] DOB: 10-30-1940 DOA: 12/14/2022 PCP: [facility]     Brief Narrative:  82 year old with past medical history significant for hypertension, hyperlipidemia, CAD status post CABG and PCI, paroxysmal A-fib, no longer on anticoagulation due to recurrent GI bleed, ESRD on hemodialysis (MWF), seizure disorder, hypothyroidism, history of pulmonary. Presented to the ED complaining of chest pain while she was returning from dialysis the day of admission. Reports chest pain sharp in nature, 10 out of 10 associated with palpitation. Patient admission to [facility]/due to AVM, received blood transfusion and was treated with argon plasma hospitalization. Developed paroxysmal Atrial flutter which was difficult to control she was placed on amiodarone and Coreg.   New events last 24 hours / Subjective: Pt's breathing was worse yesterday evening so she was given supp O2 via My[place]. Reports energy better today. No concerns otherwise. No CP, SOB, palpitations.   Assessment & Plan:   Principal Problem:   Paroxysmal atrial flutter (HCC)             Appreciate cardiology             Metoprolol titrate 25 mg twice daily             Amiodarone 200 mg twice daily for 7 days and then transition to 200 mg daily on 12/23/2022  Issue is currently resolved   Active Problems:   Mixed hyperlipidemia             Lipitor 10 mg daily     Essential hypertension             Currently stable     Wide-complex tachycardia             Currently resolved     End-stage renal disease on hemodialysis (HCC)             HD M/W/F  Appreciate nephrology              Sensipar and Rocalcitrol on dialysis days     Diabetes mellitus type 2 in nonobese (HCC)             Diet controlled   Acquired hypothyroidism             Levothyroxine 75 mcg daily     Macrocytic anemia   DVT prophylaxis: SCDs Code Status: Full Family Communication: None bedside Coming From:  SNF Disposition Plan: SNF Barriers to Discharge: SNF placement   Consultants:  Cardiology Nephrology  Objective: Vitals:   12/17/22 1952 12/17/22 2319 12/18/22 0442 12/18/22 0744  BP: (!) 144/58 (!) 153/53 (!) 175/74 (!) 173/63  Pulse: (!) 57   60  Resp: 17 (!) 21 20 (!) 21  Temp: 97.7 F (36.5 C) 99.6 F (37.6 C) 98.2 F (36.8 C) 98.2 F (36.8 C)  TempSrc: Axillary  Oral Axillary  SpO2: 100% 98% 100% 100%  Weight:   60.4 kg   Height:        Intake/Output Summary (Last 24 hours) at 12/18/2022 1210 Last data filed at 12/17/2022 1830 Gross per 24 hour  Intake --  Output 2 ml  Net -2 ml   Filed Weights   12/16/22 1107 12/17/22 0443 12/18/22 0442  Weight: 62.9 kg 60.2 kg 60.4 kg    Examination:  General exam: Appears calm and comfortable  Respiratory system: Clear to auscultation. Respiratory effort normal. No respiratory distress. No conversational dyspnea.  Cardiovascular system: S1 & S2 heard, RRR. No murmurs.  No pedal edema. Central nervous system: Alert and oriented. No focal neurological deficits. Speech clear.  Extremities: Symmetric in appearance  Skin: No rashes, lesions or ulcers on exposed skin  Psychiatry: Judgement and insight appear limited as she does not remember me from yesterday. Mood & affect appropriate.   Data Reviewed: I have personally reviewed following labs and imaging studies  CBC: Recent Labs  Lab 12/12/22 1401 12/13/22 0337 12/14/22 1857 12/18/22 0138  WBC 5.5 5.0 7.0 5.6  NEUTROABS  --  3.8 5.6  --   HGB 7.1* 7.4* 7.7* 7.5*  HCT 22.2* 22.7* 24.0* 23.3*  MCV 112.1* 108.6* 110.6* 110.4*  PLT 234 210 259 236   Basic Metabolic Panel: Recent Labs  Lab 12/12/22 1404 12/13/22 0337 12/14/22 1857 12/18/22 0138  NA 131* 132* 134* 131*  K 4.6 3.9 3.5 3.9  CL 92* 94* 96* 91*  CO2 27 25 26 27   GLUCOSE 106* 112* 120* 100*  BUN 40* 14 10 18   CREATININE 7.59* 4.18* 2.95* 5.31*  CALCIUM 8.7* 8.2* 8.0* 8.5*  PHOS  --   --   --  3.2    GFR: Estimated Creatinine Clearance: 7 mL/min (A) (by C-G formula based on SCr of 5.31 mg/dL (H)).  Liver Function Tests: Recent Labs  Lab 12/12/22 1404 12/13/22 0337 12/14/22 1857  AST 13* 15 18  ALT 10 10 12   ALKPHOS 77 73 85  BILITOT 0.6 0.7 0.8  PROT 5.0* 5.1* 5.8*  ALBUMIN 2.4* 2.5* 2.8*   Lipid Profile: Recent Labs    12/17/22 0121  CHOL 95  HDL 46  LDLCALC 34  TRIG 77  CHOLHDL 2.1   Anemia Panel: Recent Labs    12/16/22 0727 12/18/22 0138  FOLATE 37.9  --   FERRITIN  --  1,398*  TIBC  --  134*  IRON  --  118    Scheduled Meds:  amiodarone  200 mg Oral BID   [START ON 12/23/2022] amiodarone  200 mg Oral Daily   atorvastatin  10 mg Oral QHS   calcitRIOL  1.75 mcg Oral Q M,W,F-HD   Chlorhexidine Gluconate Cloth  6 each Topical Q0600   cinacalcet  30 mg Oral Q M,W,F-HD   [START ON 12/19/2022] darbepoetin (ARANESP) injection - DIALYSIS  150 mcg Subcutaneous Q Mon-1800   gabapentin  100 mg Oral QPM   levETIRAcetam  500 mg Oral Daily   levothyroxine  75 mcg Oral QAC breakfast   metoprolol tartrate  25 mg Oral BID   Continuous Infusions:   LOS: 4 days    Time spent: 25 minutes   Sharlene Dory, DO [facility] 12/18/2022, 12:10 PM   Available via Epic secure chat 7am-7pm After these hours, please refer to coverage provider listed on [email or web address]

## 2022-12-18 NOTE — Progress Notes (Addendum)
[facility] Progress Note   Subjective:    Seen and examined patient in room. Patient seen sleeping and appears comfortable. Next HD 6/10.  Objective Vitals:   12/17/22 1952 12/17/22 2319 12/18/22 0442 12/18/22 0744  BP: (!) 144/58 (!) 153/53 (!) 175/74 (!) 173/63  Pulse: (!) 57   60  Resp: 17 (!) 21 20 (!) 21  Temp: 97.7 F (36.5 C) 99.6 F (37.6 C) 98.2 F (36.8 C) 98.2 F (36.8 C)  TempSrc: Axillary  Oral Axillary  SpO2: 100% 98% 100% 100%  Weight:   60.4 kg   Height:       Physical Exam General: Elderly woman; sleeping but responds to voice; NAD Heart: Irreg; No murmurs, gallops, or rubs Lungs: Clear bilaterally Abdomen: Soft and non-tender Extremities: No LE edema Dialysis Access: [facility]  Filed Weights   12/16/22 1107 12/17/22 0443 12/18/22 0442  Weight: 62.9 kg 60.2 kg 60.4 kg    Intake/Output Summary (Last 24 hours) at 12/18/2022 1146 Last data filed at 12/17/2022 1830 Gross per 24 hour  Intake --  Output 2 ml  Net -2 ml    Additional Objective Labs: Basic Metabolic Panel: Recent Labs  Lab 12/13/22 0337 12/14/22 1857 12/18/22 0138  NA 132* 134* 131*  K 3.9 3.5 3.9  CL 94* 96* 91*  CO2 25 26 27   GLUCOSE 112* 120* 100*  BUN 14 10 18   CREATININE 4.18* 2.95* 5.31*  CALCIUM 8.2* 8.0* 8.5*  PHOS  --   --  3.2   Liver Function Tests: Recent Labs  Lab 12/12/22 1404 12/13/22 0337 12/14/22 1857  AST 13* 15 18  ALT 10 10 12   ALKPHOS 77 73 85  BILITOT 0.6 0.7 0.8  PROT 5.0* 5.1* 5.8*  ALBUMIN 2.4* 2.5* 2.8*   No results for input(s): "LIPASE", "AMYLASE" in the last 168 hours. CBC: Recent Labs  Lab 12/12/22 1401 12/13/22 0337 12/14/22 1857 12/18/22 0138  WBC 5.5 5.0 7.0 5.6  NEUTROABS  --  3.8 5.6  --   HGB 7.1* 7.4* 7.7* 7.5*  HCT 22.2* 22.7* 24.0* 23.3*  MCV 112.1* 108.6* 110.6* 110.4*  PLT 234 210 259 236   Blood Culture    Component Value Date/Time   SDES BLOOD LEFT HAND 12/04/2022 2013   SPECREQUEST  12/04/2022 2013     BOTTLES DRAWN AEROBIC AND ANAEROBIC Blood Culture adequate volume   CULT  12/04/2022 2013    NO GROWTH 5 DAYS Performed at [facility], [address], [place], [place] [address]    REPTSTATUS 12/09/2022 FINAL 12/04/2022 2013    Cardiac Enzymes: No results for input(s): "CKTOTAL", "CKMB", "CKMBINDEX", "TROPONINI" in the last 168 hours. CBG: No results for input(s): "GLUCAP" in the last 168 hours. Iron Studies:  Recent Labs    12/18/22 0138  IRON 118  TIBC 134*  FERRITIN 1,398*   Lab Results  Component Value Date   INR 1.3 (H) 11/08/2021   INR 1.0 02/03/2021   INR 1.1 06/12/2020   Studies/Results: No results found.  Medications:   amiodarone  200 mg Oral BID   [START ON 12/23/2022] amiodarone  200 mg Oral Daily   atorvastatin  10 mg Oral QHS   calcitRIOL  1.75 mcg Oral Q M,W,F-HD   Chlorhexidine Gluconate Cloth  6 each Topical Q0600   cinacalcet  30 mg Oral Q M,W,F-HD   gabapentin  100 mg Oral QPM   levETIRAcetam  500 mg Oral Daily   levothyroxine  75 mcg Oral  QAC breakfast   metoprolol tartrate  25 mg Oral BID    Dialysis Orders: [facility] EDW 58.5kg TDC 2K 2.5Ca BFR 300 DFR 500 Calcitrol 1.62mcg PO q HD Sensipar 30mg  PO q HD Mircera 200 mcg IV q 2 weeks- Last dose 11/28/22  Assessment/Plan: 1.  Paroxysmal Afib - Cardiology following; Continue Metoprolol and Amiodarone. Not on AC d/t recent history GIB 2.  End-stage renal disease: Usually on a Monday/Wednesday/Friday schedule. Informed HD clotted off on 6/7. Pt with recent hx of GIB. Plan to use tight heparin with HD to prevent ongoing clotting. Next HD 6/10. 3.  Secondary hyperparathyroidism: Phos at goal. Hold Sevelamer for now. Continue calcitriol and Sensipar TIW 4.  Hypertension: Blood pressures variable. On Metoprolol. UF as tolerated. 5. Anemia - Hgb 7.5. Iron studies: Iron 118, Tsat 88%, and Ferritin 1398. Fe is not indicated. Will resume ESA here. 6. Dispo: inpatient, anticipate SNF  placement  Salome Holmes, NP [facility] 12/18/2022,11:46 AM  LOS: 4 days

## 2022-12-19 DIAGNOSIS — I4892 Unspecified atrial flutter: Secondary | ICD-10-CM | POA: Diagnosis not present

## 2022-12-19 MED ORDER — AMIODARONE HCL 200 MG PO TABS: ORAL_TABLET | ORAL | 0 refills | Status: DC

## 2022-12-19 MED ORDER — METOPROLOL TARTRATE 25 MG PO TABS: 25.0000 mg | ORAL_TABLET | Freq: Two times a day (BID) | ORAL | Status: DC

## 2022-12-19 MED ORDER — CINACALCET HCL 30 MG PO TABS: 30.0000 mg | ORAL_TABLET | ORAL | Status: DC

## 2022-12-19 MED ORDER — HEPARIN SODIUM (PORCINE) 1000 UNIT/ML IJ SOLN
1200.0000 [IU] | Freq: Once | INTRAMUSCULAR | Status: AC
  Administered 2022-12-19: 1200 [IU]

## 2022-12-19 MED ORDER — CALCITRIOL 0.25 MCG PO CAPS: 1.7500 ug | ORAL_CAPSULE | ORAL | Status: DC

## 2022-12-19 MED ORDER — ALPRAZOLAM 0.5 MG PO TABS: 0.5000 mg | ORAL_TABLET | Freq: Every day | ORAL | 0 refills | Status: DC | PRN

## 2022-12-19 NOTE — Progress Notes (Addendum)
CSW was informed that pt is ready for dc back to [facility]. CSW called [facility] to receive confirmation pt can come back today. No one answered and VM was left. CSW sent dc summary to SNF. Awaiting confirmation pt can return.  Need updated PT note to obtain insurance auth for STR at [facility].  4:00PM CSW spoke with [facility] and they confirmed pt can return once insurance Berkley Harvey is received.

## 2022-12-19 NOTE — Progress Notes (Addendum)
[facility] Progress Note   Subjective:   Patient seen and examined at bedside in dialysis.  Reports SOB starting last night.  Some improvement with O2 but continues to struggle a little.  Increased UF goal with HD today.    Objective Vitals:   12/19/22 0930 12/19/22 1000 12/19/22 1030 12/19/22 1100  BP: (!) 111/50 (!) 121/54 (!) 119/52 (!) 98/56  Pulse: (!) 57 (!) 58 (!) 39 (!) 59  Resp: 18 17 18 17   Temp:      TempSrc:      SpO2: 100% 100% 100% 100%  Weight:      Height:       Physical Exam General:well appearing female in NAD Heart:IRIR, no mrg Lungs:scattered rhonchi, increased WOB with conversation, on 2L O2 Abdomen:soft, NTND Extremities:no LE edema Dialysis Access: [facility] in use   Filed Weights   12/17/22 0443 12/18/22 0442 12/19/22 0757  Weight: 60.2 kg 60.4 kg 59.2 kg    Intake/Output Summary (Last 24 hours) at 12/19/2022 1129 Last data filed at 12/18/2022 1347 Gross per 24 hour  Intake --  Output 1 ml  Net -1 ml    Additional Objective Labs: Basic Metabolic Panel: Recent Labs  Lab 12/13/22 0337 12/14/22 1857 12/18/22 0138  NA 132* 134* 131*  K 3.9 3.5 3.9  CL 94* 96* 91*  CO2 25 26 27   GLUCOSE 112* 120* 100*  BUN 14 10 18   CREATININE 4.18* 2.95* 5.31*  CALCIUM 8.2* 8.0* 8.5*  PHOS  --   --  3.2   Liver Function Tests: Recent Labs  Lab 12/12/22 1404 12/13/22 0337 12/14/22 1857  AST 13* 15 18  ALT 10 10 12   ALKPHOS 77 73 85  BILITOT 0.6 0.7 0.8  PROT 5.0* 5.1* 5.8*  ALBUMIN 2.4* 2.5* 2.8*    CBC: Recent Labs  Lab 12/12/22 1401 12/13/22 0337 12/14/22 1857 12/18/22 0138  WBC 5.5 5.0 7.0 5.6  NEUTROABS  --  3.8 5.6  --   HGB 7.1* 7.4* 7.7* 7.5*  HCT 22.2* 22.7* 24.0* 23.3*  MCV 112.1* 108.6* 110.6* 110.4*  PLT 234 210 259 236   Blood Culture    Component Value Date/Time   SDES BLOOD LEFT HAND 12/04/2022 2013   SPECREQUEST  12/04/2022 2013    BOTTLES DRAWN AEROBIC AND ANAEROBIC Blood Culture adequate volume    CULT  12/04/2022 2013    NO GROWTH 5 DAYS Performed at [facility], [address] [place], [place] [address]    REPTSTATUS 12/09/2022 FINAL 12/04/2022 2013    Iron Studies:  Recent Labs    12/18/22 0138  IRON 118  TIBC 134*  FERRITIN 1,398*   Lab Results  Component Value Date   INR 1.3 (H) 11/08/2021   INR 1.0 02/03/2021   INR 1.1 06/12/2020   Studies/Results: No results found.  Medications:   amiodarone  200 mg Oral BID   [START ON 12/23/2022] amiodarone  200 mg Oral Daily   atorvastatin  10 mg Oral QHS   calcitRIOL  1.75 mcg Oral Q M,W,F-HD   Chlorhexidine Gluconate Cloth  6 each Topical Q0600   cinacalcet  30 mg Oral Q M,W,F-HD   darbepoetin (ARANESP) injection - DIALYSIS  150 mcg Subcutaneous Q Mon-1800   gabapentin  100 mg Oral QPM   levETIRAcetam  500 mg Oral Daily   levothyroxine  75 mcg Oral QAC breakfast   metoprolol tartrate  25 mg Oral BID    Dialysis Orders: [facility] EDW  58.5kg TDC 2K 2.5Ca BFR 300 DFR 500 Calcitrol 1.14mcg PO q HD Sensipar 30mg  PO q HD Mircera 200 mcg IV q 2 weeks- Last dose 11/28/22   Assessment/Plan: 1.  Paroxysmal Afib - Cardiology following; Continue Metoprolol and Amiodarone. Not on AC d/t recent history GIB 2.  End-stage renal disease: Usually on a Monday/Wednesday/Friday schedule. Informed HD clotted off on 6/7. Pt with recent hx of GIB. Plan to use tight heparin with HD to prevent ongoing clotting. Next HD 6/10. 3.  Secondary hyperparathyroidism: Phos at goal. Hold Sevelamer for now. Continue calcitriol and Sensipar TIW 4.  Hypertension/Volume: Blood pressures elevated this AM.  On Metoprolol. Does not appear volume overloaded but SOB w/new O2 requirement.  Increased UFG to 3L today.  5. Anemia - Hgb 7.5. Iron studies: Iron 118, Tsat 88%, and Ferritin 1398. Fe is not indicated. Will resume ESA here. 6. Dispo: inpatient, anticipate SNF placement  Virgina Norfolk, PA-C [facility] 12/19/2022,11:29  AM  LOS: 5 days   Pt seen, examined and agree w A/P as above.  Vinson Moselle  MD 12/19/2022, 4:32 PM

## 2022-12-19 NOTE — Progress Notes (Signed)
Physical Therapy Treatment Patient Details Name: Shawna [name] MRN: [id number] DOB: May 06, 1941 Today's Date: 12/19/2022   History of Present Illness 82 yo female presenting 12/14/22 from [facility] due to CP after HD. Admitted with atrial tachycardia. Recently admitted 11/30/22-12/13/22 for rectal bleed. PMH ESRD on HD MWF, seizure disorder, afib, HTN, HLD, CAD s/p CABG and PCI, hypothyroidism, , Small bowel AVMS, GIB, Hx of colon CA, TIA, PUD, Gout.    PT Comments    Pt remains confused but was agreeable to therapy. Only able to stand at bedside due to weakness, fatigue after HD. Unable to attempt amb today. Patient will benefit from continued inpatient follow up therapy, <3 hours/day    Recommendations for follow up therapy are one component of a multi-disciplinary discharge planning process, led by the attending physician.  Recommendations may be updated based on patient status, additional functional criteria and insurance authorization.  Follow Up Recommendations  Can patient physically be transported by private vehicle: No    Assistance Recommended at Discharge Frequent or constant Supervision/Assistance  Patient can return home with the following Assistance with cooking/housework;Help with stairs or ramp for entrance;Assist for transportation;Direct supervision/assist for medications management;Direct supervision/assist for financial management;A lot of help with walking and/or transfers;A lot of help with bathing/dressing/bathroom   Equipment Recommendations  None recommended by PT    Recommendations for Other Services       Precautions / Restrictions Precautions Precautions: Fall;Other (comment) Precaution Comments: watch HR Restrictions Weight Bearing Restrictions: No     Mobility  Bed Mobility Overal bed mobility: Needs Assistance Bed Mobility: Supine to Sit, Sit to Supine     Supine to sit: Mod assist, HOB elevated Sit to supine: Mod assist   General bed mobility  comments: Assist to elevate trunk and bring hips to EOB. Assist to bring legs back up into bed    Transfers Overall transfer level: Needs assistance Equipment used: Rolling walker (2 wheels), Rollator (4 wheels) Transfers: Sit to/from Stand Sit to Stand: Mod assist           General transfer comment: Assist to power up. Pt leaning posteriorly on bed with posterior legs braced against bed. Incr time and assist to stand more erect without legs braced on bed.    Ambulation/Gait               General Gait Details: Attempted x 2 but pt too weak   Stairs             Wheelchair Mobility    Modified Rankin (Stroke Patients Only)       Balance Overall balance assessment: Needs assistance Sitting-balance support: Feet supported Sitting balance-Leahy Scale: Fair Sitting balance - Comments: Supervision sitting EOB statically   Standing balance support: Bilateral upper extremity supported, During functional activity, Reliant on assistive device for balance Standing balance-Leahy Scale: Poor Standing balance comment: walker and mod assist for static standing                            Cognition Arousal/Alertness: Awake/alert Behavior During Therapy: WFL for tasks assessed/performed Overall Cognitive Status: No family/caregiver present to determine baseline cognitive functioning Area of Impairment: Orientation, Attention, Memory, Following commands, Safety/judgement, Awareness, Problem solving                 Orientation Level: Disoriented to, Place, Time, Situation Current Attention Level: Sustained Memory: Decreased recall of precautions, Decreased short-term memory Following Commands: Follows one step commands inconsistently,  Follows one step commands with increased time Safety/Judgement: Decreased awareness of safety, Decreased awareness of deficits Awareness: Intellectual Problem Solving: Slow processing, Decreased initiation, Difficulty  sequencing, Requires verbal cues, Requires tactile cues General Comments: Pt with baseline cognitive deficits but seems more confused today        Exercises      General Comments        Pertinent Vitals/Pain Pain Assessment Pain Assessment: No/denies pain    Home Living                          Prior Function            PT Goals (current goals can now be found in the care plan section) Acute Rehab PT Goals Patient Stated Goal: not stated, pt very confused Progress towards PT goals: Not progressing toward goals - comment    Frequency    Min 1X/week      PT Plan Current plan remains appropriate    Co-evaluation              AM-PAC PT "6 Clicks" Mobility   Outcome Measure  Help needed turning from your back to your side while in a flat bed without using bedrails?: A Lot Help needed moving from lying on your back to sitting on the side of a flat bed without using bedrails?: A Lot Help needed moving to and from a bed to a chair (including a wheelchair)?: Total Help needed standing up from a chair using your arms (e.g., wheelchair or bedside chair)?: A Lot Help needed to walk in hospital room?: Total Help needed climbing 3-5 steps with a railing? : Total 6 Click Score: 9    End of Session Equipment Utilized During Treatment: Gait belt Activity Tolerance: Patient limited by fatigue Patient left: in bed;with call bell/phone within reach;with bed alarm set   PT Visit Diagnosis: Other abnormalities of gait and mobility (R26.89);Muscle weakness (generalized) (M62.81);Difficulty in walking, not elsewhere classified (R26.2);Unsteadiness on feet (R26.81)     Time: 1610-9604 PT Time Calculation (min) (ACUTE ONLY): 19 min  Charges:  $Therapeutic Activity: 8-22 mins                     [facility] PT Acute Rehabilitation Services Office [phone]    Angelina Ok [facility] 12/19/2022, 4:21 PM

## 2022-12-19 NOTE — Hospital Course (Addendum)
82 year old with past medical history significant for hypertension, hyperlipidemia, CAD status post CABG and PCI, paroxysmal A-fib, no longer on anticoagulation due to recurrent GI bleed, ESRD on hemodialysis (MWF), seizure disorder, hypothyroidism, history of pulmonary. Presented to the ED complaining of chest pain while she was returning from dialysis the day of admission. Reports chest pain sharp in nature, 10 out of 10 associated with palpitation. Patient admission to [facility]/due to AVM, received blood transfusion and was treated with argon plasma hospitalization. Developed paroxysmal Atrial flutter which was difficult to control she was placed on amiodarone and Coreg.  Seen by cardiology now on metoprolol, amiodarone.  Patient not on anticoagulation due to prior history of GI bleeding. ? Of Wide complex tachycardia, axis no change this is related to her left bundle branch block suspicious for atrial tachycardia versus atrial flutter- seen by cardiology Complicated hospitalization due to acute pulmonary edema from fluid overload needing DAILY HD Patient had lethargy acute metabolic encephalopathy with multiple comorbidities, She also tested positive for metapneumovirus, blood culture 6/13 done for fever was no growth so far Still deconditioned and weak but alert awake clinically improving. Waiting for skilled nursing facility approval. Patient remains medically stable for discharge

## 2022-12-19 NOTE — Progress Notes (Signed)
Received patient in bed to unit.  Alert and oriented.  Informed consent signed and in chart.   TX duration:3.5  Patient tolerated well.  Transported back to the room  Alert, without acute distress.  Hand-off given to patient's nurse.   Access used: LEFT TDC Access issues: NONE  Total UF removed: 3L Medication(s) given: CALCITRIOL     Bernarr Longsworth [facility]

## 2022-12-19 NOTE — Discharge Summary (Signed)
Physician Discharge Summary  Shawna [name] WUJ:[id number] DOB: Oct 09, 1940 DOA: 12/14/2022  PCP: [facility]  Admit date: 12/14/2022 Discharge date: 12/19/2022 Recommendations for Outpatient Follow-up:  Follow up with PCP in 1 weeks-call for appointment Please obtain BMP/CBC in one week Fu with cardiology in 2 wk  Discharge Dispo: SNF Discharge Condition: Stable Code Status:   Code Status: Full Code Diet recommendation:  Diet Order             Diet heart healthy/carb modified Room service appropriate? Yes; Fluid consistency: Thin  Diet effective now                    Brief/Interim Summary: 82 year old with past medical history significant for hypertension, hyperlipidemia, CAD status post CABG and PCI, paroxysmal A-fib, no longer on anticoagulation due to recurrent GI bleed, ESRD on hemodialysis (MWF), seizure disorder, hypothyroidism, history of pulmonary. Presented to the ED complaining of chest pain while she was returning from dialysis the day of admission. Reports chest pain sharp in nature, 10 out of 10 associated with palpitation. Patient admission to [facility]/due to AVM, received blood transfusion and was treated with argon plasma hospitalization. Developed paroxysmal Atrial flutter which was difficult to control she was placed on amiodarone and Coreg.  Seen by cardiology now on metoprolol, amiodarone.  Patient not on anticoagulation due to prior history of GI bleeding. ? Of Wide complex tachycardia, axis no change this is related to her left bundle branch block suspicious for atrial tachycardia versus atrial flutter- seen by cardiology. Patient underwent dialysis 6/10.  Seen by cardiology and signed off with planned outpatient follow-up in 2 weeks with Dr. Theora Gianotti nephrology no further needs and patient to be discharged to skilled nursing facility   Discharge Diagnoses:  Principal Problem:   Paroxysmal atrial flutter (HCC) Active Problems:   Macrocytic anemia    End-stage renal disease on hemodialysis (HCC)   Essential hypertension   Diabetes mellitus type 2 in nonobese [facility])   Acquired hypothyroidism   Mixed hyperlipidemia   Wide-complex tachycardia Atrial tachycardia/paroxysmal atrial fibrillation/flutter: ?WCT, axis no change so this is related to her left bundle branch block likely from atrial tachycardia versus atrial flutter- seen by cardiology.  At this time advised rhythm control strategy with amiodarone 200 twice daily x 7 days then 200 mg daily, continue metoprolol 25 twice daily not on anticoagulation due to history of GI bleed.  Chronic systolic CHF with EF 30-25% CAD status post CABG with residual small vessel disease Chest pain due to demand ischemia: Seen by cardiology known history of CABG vein grafts are patent however has residual small vessel disease that is being managed medically suspect her angina was related to tachycardia improved.  Continue blood pressure control, continue statin, not on aspirin due to history of GI bleeding.  ESRD on HD MWF nephrology following for HD on schedule today Secondary hyperparathyroidism Phos at goal holding sevelamer for now continue calcitriol and Sensipar 3 times weekly per nephrology  Anemia of chronic disease hemoglobin stable in mid 7 to 8 g range resume ESA per nephrology and iron not indicated  Hypertension: BP borderline controlled continue meds for A-fib and per cardio/nephro Hyperlipidemia: Continue statin Type diabetes mellitus diet controlled Acquired hypothyroidism on levothyroxine  Consults: Cardio nephro  Subjective: Alert awake oriented no complaints resting well,  Discharge Exam: Vitals:   12/19/22 1144 12/19/22 1152  BP: 118/65 (!) 171/64  Pulse: 68 65  Resp: 16 20  Temp: 97.8 F (36.6 C)  SpO2: 100% 100%   General: Pt is alert, awake, not in acute distress Cardiovascular: RRR, S1/S2 +, no rubs, no gallops Respiratory: CTA bilaterally, no wheezing, no  rhonchi Abdominal: Soft, NT, ND, bowel sounds + Extremities: no edema, no cyanosis  Discharge Instructions  Discharge Instructions     (HEART FAILURE PATIENTS) Call MD:  Anytime you have any of the following symptoms: 1) 3 pound weight gain in 24 hours or 5 pounds in 1 week 2) shortness of breath, with or without a dry hacking cough 3) swelling in the hands, feet or stomach 4) if you have to sleep on extra pillows at night in order to breathe.   Complete by: As directed    Discharge instructions   Complete by: As directed    Please call call MD or return to ER for similar or worsening recurring problem that brought you to hospital or if any fever,nausea/vomiting,abdominal pain, uncontrolled pain, chest pain,  shortness of breath or any other alarming symptoms.  Please follow-up your doctor as instructed in a week time and call the office for appointment.  Please avoid alcohol, smoking, or any other illicit substance and maintain healthy habits including taking your regular medications as prescribed.  You were cared for by a hospitalist during your hospital stay. If you have any questions about your discharge medications or the care you received while you were in the hospital after you are discharged, you can call the unit and ask to speak with the hospitalist on call if the hospitalist that took care of you is not available.  Once you are discharged, your primary care physician will handle any further medical issues. Please note that NO REFILLS for any discharge medications will be authorized once you are discharged, as it is imperative that you return to your primary care physician (or establish a relationship with a primary care physician if you do not have one) for your aftercare needs so that they can reassess your need for medications and monitor your lab values   Increase activity slowly   Complete by: As directed       Allergies as of 12/19/2022       Reactions   Amlodipine  Swelling   Aspirin Other (See Comments)   High Doses Mess up her stomach; "makes my bowels have blood in them". Takes 81 mg EC Aspirin    Nitrofurantoin Hives   Ranexa [ranolazine] Other (See Comments)   Myoclonus-hospitalized    Bactrim [sulfamethoxazole-trimethoprim] Rash   Contrast Media [iodinated Contrast Media] Itching   Iron Itching, Other (See Comments)   "they gave me iron in dialysis; had to give me Benadryl cause I had to have the iron" (05/02/2012)   Gabapentin Other (See Comments)   Unknown reaction   Iron Sucrose Other (See Comments)   Unknown   Sucroferric Oxyhydroxide Other (See Comments)   Unknown   Levaquin [levofloxacin In D5w] Rash   Pantoprazole Rash   Plavix [clopidogrel Bisulfate] Rash   Protonix [pantoprazole Sodium] Rash   Venofer [ferric Oxide] Itching, Other (See Comments)   Patient reports using Benadryl prior to doses as [facility]        Medication List     STOP taking these medications    sevelamer carbonate 800 MG tablet Commonly known as: RENVELA       TAKE these medications    ALPRAZolam 0.5 MG tablet Commonly known as: XANAX Take 1 tablet (0.5 mg total) by mouth daily as needed for up to  4 doses for anxiety.   amiodarone 200 MG tablet Commonly known as: PACERONE 200 mg bid till 12/23/22 after which 200 mg daily What changed:  medication strength how much to take how to take this when to take this additional instructions Another medication with the same name was removed. Continue taking this medication, and follow the directions you see here.   atorvastatin 10 MG tablet Commonly known as: LIPITOR Take 1 tablet by mouth at bedtime.   benzonatate 100 MG capsule Commonly known as: TESSALON Take 100 mg by mouth 3 (three) times daily as needed for cough.   calcitRIOL 0.25 MCG capsule Commonly known as: ROCALTROL Take 7 capsules (1.75 mcg total) by mouth every Monday, Wednesday, and Friday with hemodialysis. Start taking  on: December 21, 2022   cinacalcet 30 MG tablet Commonly known as: SENSIPAR Take 1 tablet (30 mg total) by mouth every Monday, Wednesday, and Friday with hemodialysis.   cyclobenzaprine 5 MG tablet Commonly known as: FLEXERIL Take 5 mg by mouth 3 (three) times daily as needed.   folic acid 1 MG tablet Commonly known as: FOLVITE Take 1 mg by mouth daily.   gabapentin 100 MG capsule Commonly known as: NEURONTIN Take 1 capsule (100 mg total) by mouth every evening.   guaiFENesin-codeine 100-10 MG/5ML syrup Take 5 mLs by mouth every 4 (four) hours as needed for cough.   hydrALAZINE 50 MG tablet Commonly known as: APRESOLINE Take 1 tablet (50 mg total) by mouth every 8 (eight) hours.   hydrOXYzine 25 MG tablet Commonly known as: ATARAX Take 25 mg by mouth every 6 (six) hours as needed for itching.   levETIRAcetam 500 MG tablet Commonly known as: KEPPRA Take 1 tablet (500 mg total) by mouth daily.   levothyroxine 75 MCG tablet Commonly known as: SYNTHROID Take 1 tablet (75 mcg total) by mouth daily before breakfast.   metoprolol tartrate 25 MG tablet Commonly known as: LOPRESSOR Take 1 tablet (25 mg total) by mouth 2 (two) times daily.   multivitamin Tabs tablet Take 1 tablet by mouth daily.   nitroGLYCERIN 0.4 MG SL tablet Commonly known as: NITROSTAT Place 0.4 mg under the tongue every 5 (five) minutes as needed for chest pain.   omeprazole 40 MG capsule Commonly known as: PRILOSEC Take 1 capsule (40 mg total) by mouth daily.   ondansetron 4 MG disintegrating tablet Commonly known as: ZOFRAN-ODT Take 4 mg by mouth every 8 (eight) hours as needed for nausea or vomiting.   rosuvastatin 10 MG tablet Commonly known as: CRESTOR Take 1 tablet by mouth once daily   SandoSTATIN LAR [place] 20 MG injection Generic drug: octreotide Inject 20 mg into the muscle every 28 (twenty-eight) days.        Follow-up Information     Dyann Kief, PA-C Follow up on 01/02/2023.    Specialty: Cardiology Why: 1:00PM. Cardiology follow up Contact information: [address]  [place] [address] [phone]         [facility] Follow up in 1 week(s).   Contact information: [address] [place] [address] [phone]         Antoine Poche, MD Follow up in 2 week(s).   Specialty: Cardiology Contact information: [address] [place] [address] [phone]                Allergies  Allergen Reactions   Amlodipine Swelling   Aspirin Other (See Comments)    High Doses Mess up  her stomach; "makes my bowels have blood in them". Takes 81 mg EC Aspirin    Nitrofurantoin Hives   Ranexa [Ranolazine] Other (See Comments)    Myoclonus-hospitalized    Bactrim [Sulfamethoxazole-Trimethoprim] Rash   Contrast Media [Iodinated Contrast Media] Itching   Iron Itching and Other (See Comments)    "they gave me iron in dialysis; had to give me Benadryl cause I had to have the iron" (05/02/2012)   Gabapentin Other (See Comments)    Unknown reaction   Iron Sucrose Other (See Comments)    Unknown   Sucroferric Oxyhydroxide Other (See Comments)    Unknown   Levaquin [Levofloxacin In D5w] Rash   Pantoprazole Rash   Plavix [Clopidogrel Bisulfate] Rash   Protonix [Pantoprazole Sodium] Rash   Venofer [Ferric Oxide] Itching and Other (See Comments)    Patient reports using Benadryl prior to doses as [facility]    The results of significant diagnostics from this hospitalization (including imaging, microbiology, ancillary and laboratory) are listed below for reference.    Microbiology: No results found for this or any previous visit (from the past 240 hour(s)).  Procedures/Studies: DG Chest Port 1 View  Result Date: 12/14/2022 CLINICAL DATA:  Altered mental status tachycardia EXAM: PORTABLE CHEST 1 VIEW COMPARISON:  Previous studies including the examination of 12/04/2022 FINDINGS: Transverse diameter of heart is increased. There  are no signs of alveolar pulmonary edema. Small bilateral pleural effusions are noted, more so on the left side with interval increase. Linear densities are seen in left lower lung field. There is no pneumothorax. Tip of left IJ dialysis catheter is seen in the region of junction of superior vena cava and right atrium. Vascular stents are noted in the left upper arm. Degenerative changes are noted in right shoulder. There is previous coronary bypass surgery. IMPRESSION: Small bilateral pleural effusions, more so on the left side with interval increase. Cardiomegaly. There are no signs of alveolar pulmonary edema. Linear densities in the left lower lung field may suggest subsegmental atelectasis. Electronically Signed   By: Ernie Avena M.D.   On: 12/14/2022 18:28   DG CHEST PORT 1 VIEW  Result Date: 12/04/2022 CLINICAL DATA:  [id number] Fever [id number] EXAM: PORTABLE CHEST 1 VIEW COMPARISON:  CTA GI bleed 11/13/2022 FINDINGS: Left chest wall dialysis catheter with tip overlying the right atrium. The heart and mediastinal contours are unchanged. Surgical changes likely mediastinum. Coronary artery stent. Aortic calcification. Bibasilar atelectasis. Persistent retrocardiac airspace opacity. No pulmonary edema. Persistent trace right and trace to small left pleural effusions. No pneumothorax. No acute osseous abnormality. Degenerative changes of the right shoulder. Intact sternotomy wires. IMPRESSION: 1. Persistent retrocardiac airspace opacity. 2. Persistent trace right and trace to small left pleural effusions. Electronically Signed   By: Tish Frederickson M.D.   On: 12/04/2022 22:07    Labs: BNP (last 3 results) Recent Labs    08/06/22 0139 08/26/22 1410 10/23/22 1420  BNP 1,666.2* 1,590.0* 2,205.0*   Basic Metabolic Panel: Recent Labs  Lab 12/12/22 1404 12/13/22 0337 12/14/22 1857 12/18/22 0138  NA 131* 132* 134* 131*  K 4.6 3.9 3.5 3.9  CL 92* 94* 96* 91*  CO2 27 25 26 27   GLUCOSE 106* 112*  120* 100*  BUN 40* 14 10 18   CREATININE 7.59* 4.18* 2.95* 5.31*  CALCIUM 8.7* 8.2* 8.0* 8.5*  PHOS  --   --   --  3.2   Liver Function Tests: Recent Labs  Lab 12/12/22 1404 12/13/22 0337 12/14/22 1857  AST 13* 15 18  ALT 10 10 12   ALKPHOS 77 73 85  BILITOT 0.6 0.7 0.8  PROT 5.0* 5.1* 5.8*  ALBUMIN 2.4* 2.5* 2.8*   No results for input(s): "LIPASE", "AMYLASE" in the last 168 hours. No results for input(s): "AMMONIA" in the last 168 hours. CBC: Recent Labs  Lab 12/12/22 1401 12/13/22 0337 12/14/22 1857 12/18/22 0138  WBC 5.5 5.0 7.0 5.6  NEUTROABS  --  3.8 5.6  --   HGB 7.1* 7.4* 7.7* 7.5*  HCT 22.2* 22.7* 24.0* 23.3*  MCV 112.1* 108.6* 110.6* 110.4*  PLT 234 210 259 236   Recent Labs    12/17/22 0121  CHOL 95  HDL 46  LDLCALC 34  TRIG 77  CHOLHDL 2.1   Recent Labs    12/18/22 0138  FERRITIN 1,398*  TIBC 134*  IRON 118   Urinalysis    Component Value Date/Time   COLORURINE YELLOW 12/16/2012 1919   APPEARANCEUR CLOUDY (A) 12/16/2012 1919   LABSPEC 1.009 12/16/2012 1919   PHURINE 7.5 12/16/2012 1919   GLUCOSEU NEGATIVE 12/16/2012 1919   HGBUR TRACE (A) 12/16/2012 1919   BILIRUBINUR NEGATIVE 12/16/2012 1919   KETONESUR NEGATIVE 12/16/2012 1919   PROTEINUR 100 (A) 12/16/2012 1919   UROBILINOGEN 0.2 12/16/2012 1919   NITRITE NEGATIVE 12/16/2012 1919   LEUKOCYTESUR SMALL (A) 12/16/2012 1919   Sepsis Labs Recent Labs  Lab 12/12/22 1401 12/13/22 0337 12/14/22 1857 12/18/22 0138  WBC 5.5 5.0 7.0 5.6   Microbiology No results found for this or any previous visit (from the past 240 hour(s)).   Time coordinating discharge: 25 minutes  SIGNED: Lanae Boast, MD  [facility] 12/19/2022, 12:46 PM  If 7PM-7AM, please contact night-coverage [email or web address]

## 2022-12-19 NOTE — Progress Notes (Addendum)
D/C order noted. Contacted [facility] and left a message requesting a return call. D/C summary and today's renal note faxed to clinic for continuation of care.   Olivia Canter Renal Navigator [phone]  Addendum at 3:43 pm: Spoke to Joy at [facility]. Update provided regarding pt stable for d/c today but unsure if pt will d/c to snf today. Will update clinic as information received. Joy confirmed receiving pt's clinicals that were faxed earlier today.

## 2022-12-19 NOTE — Progress Notes (Signed)
PT Cancellation Note  Patient Details Name: LUVERN MISCHKE MRN: [id number] DOB: 01-Aug-1940   Cancelled Treatment:    Reason Eval/Treat Not Completed: Patient declined, pt with HD earlier and says she feels terrible. Attempted to encourage mobility and she continued to refuse. Doesn't know she is at hospital.   Angelina Ok [facility] 12/19/2022, 2:42 PM Skip Mayer PT Acute Rehabilitation Services Office [phone]

## 2022-12-20 ENCOUNTER — Inpatient Hospital Stay (HOSPITAL_COMMUNITY): Payer: Medicare Other

## 2022-12-20 DIAGNOSIS — I4892 Unspecified atrial flutter: Secondary | ICD-10-CM | POA: Diagnosis not present

## 2022-12-20 LAB — CBC
HCT: 26.9 % — ABNORMAL LOW (ref 36.0–46.0)
Hemoglobin: 8.6 g/dL — ABNORMAL LOW (ref 12.0–15.0)
MCH: 35.7 pg — ABNORMAL HIGH (ref 26.0–34.0)
MCHC: 32 g/dL (ref 30.0–36.0)
MCV: 111.6 fL — ABNORMAL HIGH (ref 80.0–100.0)
Platelets: 235 10*3/uL (ref 150–400)
RBC: 2.41 MIL/uL — ABNORMAL LOW (ref 3.87–5.11)
RDW: 22.4 % — ABNORMAL HIGH (ref 11.5–15.5)
WBC: 10.3 10*3/uL (ref 4.0–10.5)
nRBC: 0 % (ref 0.0–0.2)

## 2022-12-20 LAB — BASIC METABOLIC PANEL
Anion gap: 13 (ref 5–15)
BUN: 15 mg/dL (ref 8–23)
CO2: 25 mmol/L (ref 22–32)
Calcium: 8.4 mg/dL — ABNORMAL LOW (ref 8.9–10.3)
Chloride: 94 mmol/L — ABNORMAL LOW (ref 98–111)
Creatinine, Ser: 5.08 mg/dL — ABNORMAL HIGH (ref 0.44–1.00)
GFR, Estimated: 8 mL/min — ABNORMAL LOW (ref >60)
Glucose, Bld: 90 mg/dL (ref 70–99)
Potassium: 4.4 mmol/L (ref 3.5–5.1)
Sodium: 132 mmol/L — ABNORMAL LOW (ref 135–145)

## 2022-12-20 LAB — BLOOD GAS, ARTERIAL
Acid-Base Excess: 0.5 mmol/L (ref 0.0–2.0)
Bicarbonate: 27 mmol/L (ref 20.0–28.0)
Drawn by: 53527
O2 Saturation: 98 %
Patient temperature: 36.4
pCO2 arterial: 49 mmHg — ABNORMAL HIGH (ref 32–48)
pH, Arterial: 7.35 (ref 7.35–7.45)
pO2, Arterial: 124 mmHg — ABNORMAL HIGH (ref 83–108)

## 2022-12-20 MED ORDER — CHLORHEXIDINE GLUCONATE CLOTH 2 % EX PADS
6.0000 | MEDICATED_PAD | Freq: Every day | CUTANEOUS | Status: DC
  Administered 2022-12-21: 6 via TOPICAL

## 2022-12-20 MED ORDER — METOPROLOL TARTRATE 25 MG PO TABS
25.0000 mg | ORAL_TABLET | Freq: Two times a day (BID) | ORAL | Status: DC
  Administered 2022-12-20 – 2022-12-28 (×13): 25 mg via ORAL
  Filled 2022-12-20 (×14): qty 1

## 2022-12-20 MED ORDER — ANTICOAGULANT SODIUM CITRATE 4% (200MG/5ML) IV SOLN: 5.0000 mL | Status: DC | PRN

## 2022-12-20 MED ORDER — HEPARIN SODIUM (PORCINE) 1000 UNIT/ML DIALYSIS
1000.0000 [IU] | INTRAMUSCULAR | Status: DC | PRN
  Administered 2022-12-20: 1000 [IU]
  Filled 2022-12-20: qty 1

## 2022-12-20 MED ORDER — ALTEPLASE 2 MG IJ SOLR: 2.0000 mg | Freq: Once | INTRAMUSCULAR | Status: DC | PRN

## 2022-12-20 MED ORDER — HYDRALAZINE HCL 50 MG PO TABS
50.0000 mg | ORAL_TABLET | Freq: Three times a day (TID) | ORAL | Status: DC
  Administered 2022-12-20 – 2022-12-24 (×10): 50 mg via ORAL
  Filled 2022-12-20 (×11): qty 1

## 2022-12-20 MED ORDER — HEPARIN SODIUM (PORCINE) 1000 UNIT/ML DIALYSIS
20.0000 [IU]/kg | INTRAMUSCULAR | Status: DC | PRN
  Administered 2022-12-20: 1100 [IU] via INTRAVENOUS_CENTRAL

## 2022-12-20 NOTE — Telephone Encounter (Signed)
Forms were signed and faxed to [facility] on 12/19/2022.

## 2022-12-20 NOTE — Progress Notes (Signed)
Occupational Therapy Treatment Patient Details Name: Shawna [name] MRN: [id number] DOB: 27-Feb-1941 Today's Date: 12/20/2022   History of present illness 82 yo Shawna presenting 12/14/22 from [facility] due to CP after HD. Admitted with atrial tachycardia. Recently admitted 11/30/22-12/13/22 for rectal bleed. PMH ESRD on HD MWF, seizure disorder, afib, HTN, HLD, CAD s/p CABG and PCI, hypothyroidism, , Small bowel AVMS, GIB, Hx of colon CA, TIA, PUD, Gout.   OT comments  Patient with increased lethargy this session, needing more assist for transfers and ADL than previous sessions.  Mod A for basic mobility and ADL completion.  Patient with difficulty keeping eyes open, stating she did not sleep last night.  OT will continue to address deficits in the acute setting, and Patient will benefit from continued inpatient follow up therapy, <3 hours/day.   Recommendations for follow up therapy are one component of a multi-disciplinary discharge planning process, led by the attending physician.  Recommendations may be updated based on patient status, additional functional criteria and insurance authorization.    Assistance Recommended at Discharge Frequent or constant Supervision/Assistance  Patient can return home with the following  Assistance with cooking/housework;Direct supervision/assist for medications management;Direct supervision/assist for financial management;Assist for transportation;Help with stairs or ramp for entrance;A little help with walking and/or transfers;A little help with bathing/dressing/bathroom;A lot of help with bathing/dressing/bathroom   Equipment Recommendations  None recommended by OT    Recommendations for Other Services      Precautions / Restrictions Precautions Precautions: Fall;Other (comment) Precaution Comments: watch HR Restrictions Weight Bearing Restrictions: No       Mobility Bed Mobility Overal bed mobility: Needs Assistance Bed Mobility: Supine to Sit      Supine to sit: Mod assist, HOB elevated          Transfers Overall transfer level: Needs assistance Equipment used: Rolling walker (2 wheels), Rollator (4 wheels) Transfers: Sit to/from Stand, Bed to chair/wheelchair/BSC Sit to Stand: Mod assist     Step pivot transfers: Mod assist           Balance Overall balance assessment: Needs assistance Sitting-balance support: Feet supported Sitting balance-Leahy Scale: Poor   Postural control: Posterior lean Standing balance support: Bilateral upper extremity supported Standing balance-Leahy Scale: Poor                             ADL either performed or assessed with clinical judgement   ADL   Eating/Feeding: Set up;Sitting   Grooming: Set up;Sitting;Wash/dry hands;Wash/dry face       Lower Body Bathing: Moderate assistance;Sitting/lateral leans       Lower Body Dressing: Moderate assistance;Sit to/from stand   Toilet Transfer: Moderate assistance;Stand-pivot;BSC/3in1                  Extremity/Trunk Assessment Upper Extremity Assessment Upper Extremity Assessment: Generalized weakness   Lower Extremity Assessment Lower Extremity Assessment: Defer to PT evaluation   Cervical / Trunk Assessment Cervical / Trunk Assessment: Kyphotic    Vision       Perception     Praxis      Cognition Arousal/Alertness: Lethargic Behavior During Therapy: Flat affect Overall Cognitive Status: Impaired/Different from baseline                   Orientation Level: Disoriented to, Place, Time, Situation           Problem Solving: Slow processing, Decreased initiation, Difficulty sequencing, Requires verbal cues, Requires tactile cues  General Comments  VSS on supplemental O2    Pertinent Vitals/ Pain       Pain Assessment Pain Assessment: No/denies pain                                                          Frequency  Min  2X/week        Progress Toward Goals  OT Goals(current goals can now be found in the care plan section)  Progress towards OT goals: Progressing toward goals  Acute Rehab OT Goals OT Goal Formulation: With patient Time For Goal Achievement: 12/31/22 Potential to Achieve Goals: Fair  Plan Discharge plan remains appropriate    Co-evaluation                 AM-PAC OT "6 Clicks" Daily Activity     Outcome Measure   Help from another person eating meals?: A Little Help from another person taking care of personal grooming?: A Little Help from another person toileting, which includes using toliet, bedpan, or urinal?: A Lot Help from another person bathing (including washing, rinsing, drying)?: A Lot Help from another person to put on and taking off regular upper body clothing?: A Lot Help from another person to put on and taking off regular lower body clothing?: A Lot 6 Click Score: 14    End of Session Equipment Utilized During Treatment: Oxygen  OT Visit Diagnosis: Unsteadiness on feet (R26.81);Other abnormalities of gait and mobility (R26.89);Muscle weakness (generalized) (M62.81);Pain Pain - Right/Left: Right   Activity Tolerance Patient limited by lethargy   Patient Left in chair;with call bell/phone within reach;with chair alarm set   Nurse Communication Mobility status        Time: 1610-9604 OT Time Calculation (min): 17 min  Charges: OT General Charges $OT Visit: 1 Visit OT Treatments $Self Care/Home Management : 8-22 mins  12/20/2022  RP, OTR/L  Acute Rehabilitation Services  Office:  [phone]   Suzanna Obey 12/20/2022, 8:34 AM

## 2022-12-20 NOTE — Progress Notes (Signed)
Pt BP >190 systolic, MD notified. 10am meds given early for BP. Pt complaining of nausea and SOB. Pt is on 2L, exp wheezing, and wet cough. Will cont to monitor.

## 2022-12-20 NOTE — Care Management Important Message (Signed)
Important Message  Patient Details  Name: Shawna [name] MRN: [id number] Date of Birth: October 19, 1940   Medicare Important Message Given:  Yes     Sherilyn Banker 12/20/2022, 4:28 PM

## 2022-12-20 NOTE — Progress Notes (Addendum)
[facility] Progress Note   Subjective:   Patient seen and examined at bedside.  Reports increased SOB starting this AM.  More lethargic today, slower to respond to questions.  Denies CP, abdominal pain and n/v/d.   Objective Vitals:   12/20/22 0419 12/20/22 0722 12/20/22 0840 12/20/22 1153  BP: (!) 183/66 (!) 190/62 (!) 179/56 (!) 163/59  Pulse: 63 64 60 61  Resp: 20 17 (!) 24 (!) 21  Temp: 97.6 F (36.4 C) 97.6 F (36.4 C)  98.2 F (36.8 C)  TempSrc: Oral Oral  Axillary  SpO2: 100% 100% 100% 100%  Weight: 56 kg     Height:       Physical Exam General:ill appearing female in NAD Heart:RRR Lungs:+rhonchi, increased WOB on 3L O2 via [place] Abdomen:soft, NTND Extremities:no LE edema Dialysis Access: [facility]   Filed Weights   12/19/22 0757 12/19/22 1151 12/20/22 0419  Weight: 59.2 kg 55.8 kg 56 kg    Intake/Output Summary (Last 24 hours) at 12/20/2022 1354 Last data filed at 12/19/2022 2124 Gross per 24 hour  Intake 100 ml  Output --  Net 100 ml    Additional Objective Labs: Basic Metabolic Panel: Recent Labs  Lab 12/14/22 1857 12/18/22 0138 12/20/22 0858  NA 134* 131* 132*  K 3.5 3.9 4.4  CL 96* 91* 94*  CO2 26 27 25   GLUCOSE 120* 100* 90  BUN 10 18 15   CREATININE 2.95* 5.31* 5.08*  CALCIUM 8.0* 8.5* 8.4*  PHOS  --  3.2  --    Liver Function Tests: Recent Labs  Lab 12/14/22 1857  AST 18  ALT 12  ALKPHOS 85  BILITOT 0.8  PROT 5.8*  ALBUMIN 2.8*    CBC: Recent Labs  Lab 12/14/22 1857 12/18/22 0138 12/20/22 0858  WBC 7.0 5.6 10.3  NEUTROABS 5.6  --   --   HGB 7.7* 7.5* 8.6*  HCT 24.0* 23.3* 26.9*  MCV 110.6* 110.4* 111.6*  PLT 259 236 235   Iron Studies:  Recent Labs    12/18/22 0138  IRON 118  TIBC 134*  FERRITIN 1,398*   Lab Results  Component Value Date   INR 1.3 (H) 11/08/2021   INR 1.0 02/03/2021   INR 1.1 06/12/2020   Studies/Results: DG Chest Port 1 View  Result Date: 12/20/2022 CLINICAL DATA:  Shortness of  breath EXAM: PORTABLE CHEST 1 VIEW COMPARISON:  Previous studies including the examination done on 12/14/2022 FINDINGS: Transverse diameter of heart is increased. Central pulmonary vessels are more prominent. There is increase in interstitial markings in the parahilar regions. There is no focal pulmonary consolidation. There is blunting of both lateral CP angles. There is interval decrease in amount of bilateral pleural effusions. Vascular stent is seen in left upper arm. Degenerative changes are noted in right shoulder. There is previous coronary bypass surgery. Tip of left IJ central venous catheter is seen in right atrium. IMPRESSION: Cardiomegaly. Central pulmonary vessels are more prominent suggesting CHF. There is subtle increase in interstitial markings in the parahilar regions suggesting possible mild interstitial edema. Small bilateral pleural effusions with interval decrease in size. Electronically Signed   By: Ernie Avena M.D.   On: 12/20/2022 13:30    Medications:   amiodarone  200 mg Oral BID   [START ON 12/23/2022] amiodarone  200 mg Oral Daily   atorvastatin  10 mg Oral QHS   calcitRIOL  1.75 mcg Oral Q M,W,F-HD   Chlorhexidine Gluconate Cloth  6 each Topical Q0600   [  START ON 12/21/2022] Chlorhexidine Gluconate Cloth  6 each Topical Q0600   cinacalcet  30 mg Oral Q M,W,F-HD   darbepoetin (ARANESP) injection - DIALYSIS  150 mcg Subcutaneous Q Mon-1800   gabapentin  100 mg Oral QPM   hydrALAZINE  50 mg Oral Q8H   levETIRAcetam  500 mg Oral Daily   levothyroxine  75 mcg Oral QAC breakfast   metoprolol tartrate  25 mg Oral BID    Dialysis Orders: [facility] EDW 58.5kg TDC 2K 2.5Ca BFR 300 DFR 500 Calcitrol 1.61mcg PO q HD Sensipar 30mg  PO q HD Mircera 200 mcg IV q 2 weeks- Last dose 11/28/22   Assessment/Plan: 1.  Paroxysmal Afib - Cardiology following; Continue Metoprolol and Amiodarone. Not on AC d/t recent history GIB 2. Shortness of breath - initially improved with  HD yesterday but started again this AM. Net UF 3L yesterday. Under edw. CXR w/possible interstitial edema, extra HD ordered for today for volume removal.  2.  End-stage renal disease: Usually on a Monday/Wednesday/Friday schedule. Informed HD clotted off on 6/7. Pt with recent hx of GIB. Plan to use tight heparin with HD to prevent ongoing clotting. Extra HD today for volume overload, then HD again tomorrow per regular schedule.  3.  Secondary hyperparathyroidism: Phos at goal. Hold Sevelamer for now. Continue calcitriol and Sensipar TIW 4.  Hypertension: Blood pressures elevated this AM.  On Metoprolol. Hydralazine 25mg  restarted.  5. Anemia - Hgb^8.6. Iron studies: Iron 118, Tsat 88%, and Ferritin 1398. Fe is not indicated. Aranesp started yesterday. 6. Dispo: inpatient, anticipate SNF placement  Virgina Norfolk, PA-C [facility] 12/20/2022,1:54 PM  LOS: 6 days   Pt seen, examined and agree w A/P as above.  Vinson Moselle  MD 12/20/2022, 4:39 PM

## 2022-12-20 NOTE — Progress Notes (Signed)
RT obtained ABG and sent to lab. RT called lab @1152  and notified them of STAT ABG currently on the way. RT awaiting results.

## 2022-12-20 NOTE — Progress Notes (Addendum)
PROGRESS NOTE Shawna [name]  WUJ:[id number] DOB: 18-Jul-1940 DOA: 12/14/2022 PCP: [facility]  Brief Narrative/Hospital Course: 82 year old with past medical history significant for hypertension, hyperlipidemia, CAD status post CABG and PCI, paroxysmal A-fib, no longer on anticoagulation due to recurrent GI bleed, ESRD on hemodialysis (MWF), seizure disorder, hypothyroidism, history of pulmonary. Presented to the ED complaining of chest pain while she was returning from dialysis the day of admission. Reports chest pain sharp in nature, 10 out of 10 associated with palpitation. Patient admission to [facility]/due to AVM, received blood transfusion and was treated with argon plasma hospitalization. Developed paroxysmal Atrial flutter which was difficult to control she was placed on amiodarone and Coreg.  Seen by cardiology now on metoprolol, amiodarone.  Patient not on anticoagulation due to prior history of GI bleeding. ? Of Wide complex tachycardia, axis no change this is related to her left bundle branch block suspicious for atrial tachycardia versus atrial flutter- seen by cardiology. Patient underwent dialysis 6/10.  Seen by cardiology and signed off with planned outpatient follow-up in 2 weeks with Dr. Theora Gianotti nephrology no further needs and patient to be discharged to skilled nursing facility    Subjective: Seen examined this morning Blood pressure was running high and meds given> BP down to 170s to 190s, also appeared short of breath on the bedside chair on 2 L nasal cannula somewhat lethargic. Able to wake up and interact   Assessment and Plan: Principal Problem:   Paroxysmal atrial flutter (HCC) Active Problems:   Macrocytic anemia   End-stage renal disease on hemodialysis (HCC)   Essential hypertension   Diabetes mellitus type 2 in nonobese [facility])   Acquired hypothyroidism   Mixed hyperlipidemia   Wide-complex tachycardia   Atrial tachycardia/paroxysmal atrial  fibrillation/flutter:?WCT, axis no change so this is related to her left bundle branch block likely from atrial tachycardia versus atrial flutter- seen by cardiology. At this time advised rhythm control strategy with amiodarone 200 twice daily x 7 days then 200 mg daily, continue metoprolol 25 twice daily not on anticoagulation due to history of GI bleed.   Chronic systolic CHF with EF 30-35% CAD status post CABG with residual small vessel disease Chest pain due to demand ischemia: Seen by cardiology known history of CABG vein grafts are patent however has residual small vessel disease that is being managed medically suspect her angina was related to tachycardia, IS improved.  Continue BP control, continue statin. Not on aspirin due to history of GI bleeding.  Shortness of breath Lethargy -confusion-suspect acute metabolic encephalopathy in the setting of multiple comorbidities weakness deconditioning: Hospital exacerbation of acute on chronic systolic chf and fluid overload along with uncontrolled hypertension. Reviewed chest x-ray> blood pressure continues to explain features of CHF, ABG without CO2 retention.  Nephrology notified> patient will get extra HD today.  Daughter updated   ESRD on HD NFA:[id number] following for HD- last HD 6/10. On HD for last 13 years  Secondary hyperparathyroidism Phos at goal holding sevelamer for now continue calcitriol and Sensipar 3 times weekly per nephrology   Anemia of chronic disease hb stable in mid 7 to 8 g range resume-ESA per nephrology and iron not indicated Recent Labs  Lab 12/14/22 1857 12/18/22 0138 12/20/22 0858  HGB 7.7* 7.5* 8.6*  HCT 24.0* 23.3* 26.9*     Hypertension:BP poorly controlled> continue current metoprolol, home hydralazine 50 mg every 8 hours resumed. Monitor BP.  Blood pressure has been fluctuating  Hyperlipidemia:Continue statin Type diabetes mellitus diet controlled  Acquired hypothyroidism on levothyroxine.  GOC: She  is weak and frail, high risk for readmission, decompensation has chronic systolic CHF with CAD currently on dialysis dependent. I called and updated patient's daughter 6/11. Consult palliative care  as she is currently full code-daughter has been educated to consider DNR/DNI understanding evidenced-based poor outcomes in similar hospitalized ptient, as a cause of arrest is likely associated with advanced chronic illness rather than easily reversible acute cardiopulmonary.    DVT prophylaxis: SCDs Start: 12/14/22 2155 Code Status:   Code Status: Full Code Family Communication: plan of care discussed with patient at bedside. Patient status is: Inpatient because of shortness of breath, arteriosclerosis of PRN Level of care: Telemetry Medical   Dispo: The patient is from: home with husband, but currently in SNF since last week            Anticipated disposition: SNF likely tomorrow Objective: Vitals last 24 hrs: Vitals:   12/20/22 0105 12/20/22 0419 12/20/22 0722 12/20/22 0840  BP: (!) 187/70 (!) 183/66 (!) 190/62 (!) 179/56  Pulse: 63 63 64 60  Resp: 20 20 17  (!) 24  Temp: 99 F (37.2 C) 97.6 F (36.4 C) 97.6 F (36.4 C)   TempSrc: Oral Oral Oral   SpO2: 100% 100% 100% 100%  Weight:  56 kg    Height:       Weight change:   Physical Examination: General exam: Sleeping, chronically sick looking, elderly female older than stated age HEENT:Oral mucosa moist, Ear/Nose WNL grossly Respiratory system: bilaterally mild wheezing, diminished BS, but short of breath  Cardiovascular system: S1 & S2 +, No JVD. Gastrointestinal system: Abdomen soft,NT,ND, BS+ Nervous System:Alert, awake, moving extremities. Extremities: LE edema mild ,distal peripheral pulses palpable.  Skin: No rashes,no icterus. MSK: Normal muscle bulk,tone, power  Medications reviewed:  Scheduled Meds:  amiodarone  200 mg Oral BID   [START ON 12/23/2022] amiodarone  200 mg Oral Daily   atorvastatin  10 mg Oral QHS    calcitRIOL  1.75 mcg Oral Q M,W,F-HD   Chlorhexidine Gluconate Cloth  6 each Topical Q0600   cinacalcet  30 mg Oral Q M,W,F-HD   darbepoetin (ARANESP) injection - DIALYSIS  150 mcg Subcutaneous Q Mon-1800   gabapentin  100 mg Oral QPM   hydrALAZINE  50 mg Oral Q8H   levETIRAcetam  500 mg Oral Daily   levothyroxine  75 mcg Oral QAC breakfast   metoprolol tartrate  25 mg Oral BID   Continuous Infusions:    Diet Order             Diet heart healthy/carb modified Room service appropriate? Yes; Fluid consistency: Thin  Diet effective now                   Intake/Output Summary (Last 24 hours) at 12/20/2022 1109 Last data filed at 12/19/2022 2124 Gross per 24 hour  Intake 100 ml  Output 3000 ml  Net -2900 ml   Net IO Since Admission: -2,509.1 mL [12/20/22 1109]  Wt Readings from Last 3 Encounters:  12/20/22 56 kg  12/12/22 56.9 kg  11/18/22 59.4 kg     Unresulted Labs (From admission, onward)     Start     Ordered   12/20/22 0941  Blood gas, arterial  ONCE - STAT,   STAT        12/20/22 0940   12/18/22 0500  CBC  Daily,   R     Question:  Specimen collection method  Answer:  Lab=Lab collect   12/17/22 1216   12/18/22 0500  Basic metabolic panel  Daily,   R     Question:  Specimen collection method  Answer:  Lab=Lab collect   12/17/22 1216          Data Reviewed: I have personally reviewed following labs and imaging studies CBC: Recent Labs  Lab 12/14/22 1857 12/18/22 0138 12/20/22 0858  WBC 7.0 5.6 10.3  NEUTROABS 5.6  --   --   HGB 7.7* 7.5* 8.6*  HCT 24.0* 23.3* 26.9*  MCV 110.6* 110.4* 111.6*  PLT 259 236 235   Basic Metabolic Panel: Recent Labs  Lab 12/14/22 1857 12/18/22 0138 12/20/22 0858  NA 134* 131* 132*  K 3.5 3.9 4.4  CL 96* 91* 94*  CO2 26 27 25   GLUCOSE 120* 100* 90  BUN 10 18 15   CREATININE 2.95* 5.31* 5.08*  CALCIUM 8.0* 8.5* 8.4*  PHOS  --  3.2  --    GFR: Estimated Creatinine Clearance: 6.8 mL/min (A) (by C-G formula based  on SCr of 5.08 mg/dL (H)). Liver Function Tests: Recent Labs  Lab 12/14/22 1857  AST 18  ALT 12  ALKPHOS 85  BILITOT 0.8  PROT 5.8*  ALBUMIN 2.8*   No results found for this or any previous visit (from the past 240 hour(s)).  Antimicrobials: Anti-infectives (From admission, onward)    None      Culture/Microbiology    Component Value Date/Time   SDES BLOOD LEFT HAND 12/04/2022 2013   SPECREQUEST  12/04/2022 2013    BOTTLES DRAWN AEROBIC AND ANAEROBIC Blood Culture adequate volume   CULT  12/04/2022 2013    NO GROWTH 5 DAYS Performed at [facility], [address] [place], [place] [address]    REPTSTATUS 12/09/2022 FINAL 12/04/2022 2013   Radiology Studies: No results found.   LOS: 6 days   Lanae Boast, MD [facility]  12/20/2022, 11:09 AM

## 2022-12-20 NOTE — Progress Notes (Signed)
Contacted [employer] and spoke to [employer]. Clinic advised that pt will was not d/c yesterday or today and will not resume tomorrow. Will provide update to clinic once d/c date is confirmed. Will assist as needed.   Olivia Canter Renal Navigator [phone]

## 2022-12-20 NOTE — Progress Notes (Signed)
POST HD TX NOTE  12/20/22 1813  Vitals  Temp 98.2 F (36.8 C)  Temp Source Oral  BP (!) 161/56  MAP (mmHg) 85  BP Location Right Arm  BP Method Automatic  Patient Position (if appropriate) Lying  Pulse Rate (!) 59  Pulse Rate Source Monitor  ECG Heart Rate 60  Resp 18  Oxygen Therapy  SpO2 100 %  O2 Device Nasal Cannula  O2 Flow Rate (L/min) 2 L/min  Pulse Oximetry Type Continuous  During Treatment Monitoring  Intra-Hemodialysis Comments (S)   (post HD tx VS check)  Post Treatment  Dialyzer Clearance Lightly streaked  Duration of HD Treatment -hour(s) 3 hour(s)  Hemodialysis Intake (mL) 200 mL (NS bolus for low bp near end of tx)  Liters Processed 72  Fluid Removed (mL) 2500 mL  Tolerated HD Treatment No (Comment)  Post-Hemodialysis Comments (S)  tx completed w/ bp issues about the last hour of tx where pt dropped really low and then come back up normal, UF was turned off when it got low. bp issue resolved and once bp got to 139, UF was turned back on. but it wasn't long before the bp dropped again. this time i gave the pt a NS bolus and kept UF off remainder of tx, short time that it was. UF goal was still met, blood rinsed back, VSS. Medication Admin: Heparin 1100 units bolus, Heparin Dwells 3800 units  Hemodialysis Catheter Left Subclavian Double lumen Temporary (Non-Tunneled)  Placement Date: 06/09/22   Placed prior to admission: Yes  Orientation: Left  Access Location: Subclavian  Hemodialysis Catheter Type: Double lumen Temporary (Non-Tunneled)  Site Condition No complications  Blue Lumen Status Heparin locked;Dead end cap in place  Red Lumen Status Heparin locked;Dead end cap in place  Purple Lumen Status N/A  Catheter fill solution Heparin 1000 units/ml  Catheter fill volume (Arterial) 1.9 cc  Catheter fill volume (Venous) 1.9  Dressing Type Transparent  Dressing Status Antimicrobial disc in place;Clean, Dry, Intact  Drainage Description None  Dressing Change  Due 12/22/22  Post treatment catheter status Capped and Clamped

## 2022-12-21 DIAGNOSIS — I4892 Unspecified atrial flutter: Secondary | ICD-10-CM | POA: Diagnosis not present

## 2022-12-21 DIAGNOSIS — Z992 Dependence on renal dialysis: Secondary | ICD-10-CM | POA: Diagnosis not present

## 2022-12-21 DIAGNOSIS — N186 End stage renal disease: Secondary | ICD-10-CM | POA: Diagnosis not present

## 2022-12-21 DIAGNOSIS — Z7189 Other specified counseling: Secondary | ICD-10-CM

## 2022-12-21 LAB — CBC
HCT: 20 % — ABNORMAL LOW (ref 36.0–46.0)
Hemoglobin: 6.6 g/dL — CL (ref 12.0–15.0)
MCH: 36.7 pg — ABNORMAL HIGH (ref 26.0–34.0)
MCHC: 33 g/dL (ref 30.0–36.0)
MCV: 111.1 fL — ABNORMAL HIGH (ref 80.0–100.0)
Platelets: 231 10*3/uL (ref 150–400)
RBC: 1.8 MIL/uL — ABNORMAL LOW (ref 3.87–5.11)
RDW: 22 % — ABNORMAL HIGH (ref 11.5–15.5)
WBC: 8.9 10*3/uL (ref 4.0–10.5)
nRBC: 0 % (ref 0.0–0.2)

## 2022-12-21 LAB — BASIC METABOLIC PANEL
Anion gap: 16 — ABNORMAL HIGH (ref 5–15)
BUN: 25 mg/dL — ABNORMAL HIGH (ref 8–23)
CO2: 21 mmol/L — ABNORMAL LOW (ref 22–32)
Calcium: 8.1 mg/dL — ABNORMAL LOW (ref 8.9–10.3)
Chloride: 94 mmol/L — ABNORMAL LOW (ref 98–111)
Creatinine, Ser: 6.1 mg/dL — ABNORMAL HIGH (ref 0.44–1.00)
GFR, Estimated: 6 mL/min — ABNORMAL LOW (ref >60)
Glucose, Bld: 85 mg/dL (ref 70–99)
Potassium: 4.4 mmol/L (ref 3.5–5.1)
Sodium: 131 mmol/L — ABNORMAL LOW (ref 135–145)

## 2022-12-21 LAB — PREPARE RBC (CROSSMATCH)

## 2022-12-21 MED ORDER — SODIUM CHLORIDE 0.9% IV SOLUTION: Freq: Once | INTRAVENOUS | Status: DC

## 2022-12-21 MED ORDER — ALTEPLASE 2 MG IJ SOLR: 2.0000 mg | Freq: Once | INTRAMUSCULAR | Status: DC | PRN

## 2022-12-21 MED ORDER — CHLORHEXIDINE GLUCONATE CLOTH 2 % EX PADS
6.0000 | MEDICATED_PAD | Freq: Every day | CUTANEOUS | Status: DC
  Administered 2022-12-21 – 2022-12-22 (×2): 6 via TOPICAL

## 2022-12-21 MED ORDER — ANTICOAGULANT SODIUM CITRATE 4% (200MG/5ML) IV SOLN: 5.0000 mL | Status: DC | PRN

## 2022-12-21 MED ORDER — HEPARIN SODIUM (PORCINE) 1000 UNIT/ML DIALYSIS: 20.0000 [IU]/kg | INTRAMUSCULAR | Status: DC | PRN

## 2022-12-21 MED ORDER — HEPARIN SODIUM (PORCINE) 1000 UNIT/ML DIALYSIS
1000.0000 [IU] | INTRAMUSCULAR | Status: DC | PRN
  Filled 2022-12-21: qty 1

## 2022-12-21 NOTE — Progress Notes (Signed)
[facility] Progress Note   Subjective:   Patient seen and examined at bedside during dialysis.  BP issues intermittently throughout treatment, hypotension followed by hypertension.  Complains of back pain and being cold.  Breathing better.  Denies CP, abdominal pain and n/v/d.   Objective Vitals:   12/21/22 1130 12/21/22 1148 12/21/22 1201 12/21/22 1232  BP: (!) 168/64 (!) 169/63 (!) 189/69 (!) 170/67  Pulse: 66 70 (!) 57 76  Resp: 18 20 19 19   Temp:   98.1 F (36.7 C) 98.2 F (36.8 C)  TempSrc:   Oral Oral  SpO2: 100% 99% 97% 98%  Weight:   49.5 kg   Height:       Physical Exam General:alert, elderly female in NAD Heart:RRR, no mrg Lungs:CTAB anteriorly Abdomen:soft, NTND Extremities:no LE edema Dialysis Access: [facility]   Filed Weights   12/21/22 0508 12/21/22 0748 12/21/22 1201  Weight: 55.8 kg 51.9 kg 49.5 kg    Intake/Output Summary (Last 24 hours) at 12/21/2022 1234 Last data filed at 12/21/2022 1201 Gross per 24 hour  Intake 365 ml  Output 4900 ml  Net -4535 ml    Additional Objective Labs: Basic Metabolic Panel: Recent Labs  Lab 12/18/22 0138 12/20/22 0858 12/21/22 0034  NA 131* 132* 131*  K 3.9 4.4 4.4  CL 91* 94* 94*  CO2 27 25 21*  GLUCOSE 100* 90 85  BUN 18 15 25*  CREATININE 5.31* 5.08* 6.10*  CALCIUM 8.5* 8.4* 8.1*  PHOS 3.2  --   --    Liver Function Tests: Recent Labs  Lab 12/14/22 1857  AST 18  ALT 12  ALKPHOS 85  BILITOT 0.8  PROT 5.8*  ALBUMIN 2.8*   CBC: Recent Labs  Lab 12/14/22 1857 12/18/22 0138 12/20/22 0858 12/21/22 0034  WBC 7.0 5.6 10.3 8.9  NEUTROABS 5.6  --   --   --   HGB 7.7* 7.5* 8.6* 6.6*  HCT 24.0* 23.3* 26.9* 20.0*  MCV 110.6* 110.4* 111.6* 111.1*  PLT 259 236 235 231   Blood Culture    Component Value Date/Time   SDES BLOOD LEFT HAND 12/04/2022 2013   SPECREQUEST  12/04/2022 2013    BOTTLES DRAWN AEROBIC AND ANAEROBIC Blood Culture adequate volume   CULT  12/04/2022 2013    NO  GROWTH 5 DAYS Performed at [facility], [address] [place], [place] [address]    REPTSTATUS 12/09/2022 FINAL 12/04/2022 2013    Studies/Results: DG Chest Port 1 View  Result Date: 12/20/2022 CLINICAL DATA:  Shortness of breath EXAM: PORTABLE CHEST 1 VIEW COMPARISON:  Previous studies including the examination done on 12/14/2022 FINDINGS: Transverse diameter of heart is increased. Central pulmonary vessels are more prominent. There is increase in interstitial markings in the parahilar regions. There is no focal pulmonary consolidation. There is blunting of both lateral CP angles. There is interval decrease in amount of bilateral pleural effusions. Vascular stent is seen in left upper arm. Degenerative changes are noted in right shoulder. There is previous coronary bypass surgery. Tip of left IJ central venous catheter is seen in right atrium. IMPRESSION: Cardiomegaly. Central pulmonary vessels are more prominent suggesting CHF. There is subtle increase in interstitial markings in the parahilar regions suggesting possible mild interstitial edema. Small bilateral pleural effusions with interval decrease in size. Electronically Signed   By: Ernie Avena M.D.   On: 12/20/2022 13:30    Medications:   sodium chloride   Intravenous Once   amiodarone  200  mg Oral BID   [START ON 12/23/2022] amiodarone  200 mg Oral Daily   atorvastatin  10 mg Oral QHS   calcitRIOL  1.75 mcg Oral Q M,W,F-HD   Chlorhexidine Gluconate Cloth  6 each Topical Q0600   Chlorhexidine Gluconate Cloth  6 each Topical Q0600   Chlorhexidine Gluconate Cloth  6 each Topical Q0600   cinacalcet  30 mg Oral Q M,W,F-HD   darbepoetin (ARANESP) injection - DIALYSIS  150 mcg Subcutaneous Q Mon-1800   gabapentin  100 mg Oral QPM   hydrALAZINE  50 mg Oral Q8H   levETIRAcetam  500 mg Oral Daily   levothyroxine  75 mcg Oral QAC breakfast   metoprolol tartrate  25 mg Oral BID    Dialysis Orders: [facility] EDW 58.5kg  TDC 2K 2.5Ca BFR 300 DFR 500 Calcitrol 1.28mcg PO q HD Sensipar 30mg  PO q HD Mircera 200 mcg IV q 2 weeks- Last dose 11/28/22   Assessment/Plan: 1.  Paroxysmal Afib - Cardiology following; Continue Metoprolol and Amiodarone. Not on AC d/t recent history GIB 2. Shortness of breath - Improved today.  CXR 6/11 w/likely interstitial edema, serial HD for volume removal last 3 days. Well under her dry weight, needs to be lowered.  Post weight today 49.5kg. 2.  End-stage renal disease: Usually on a Monday/Wednesday/Friday schedule. Informed HD clotted off on 6/7. Pt with recent hx of GIB. Plan to use tight heparin with HD to prevent ongoing clotting. Serial HD last 3 days d/t pulmonary edema.  Next HD 6/14.  3.  Secondary hyperparathyroidism: Phos at goal. Hold Sevelamer for now. Continue calcitriol and Sensipar TIW 4.  Hypertension: Blood pressures elevated this AM.  On Metoprolol. Hydralazine 25mg  restarted.  5. Anemia - Hgb drop 6.6 today, 1 unit pRBC given with HD. Iron studies: Iron 118, Tsat 88%, and Ferritin 1398. Fe is not indicated. Aranesp given 6/10.  6. Dispo: inpatient, anticipate SNF placement. Palliative care consulted.   Virgina Norfolk, PA-C [facility] 12/21/2022,12:34 PM  LOS: 7 days

## 2022-12-21 NOTE — Consult Note (Signed)
Consultation Note Date: 12/21/2022   Patient Name: Shawna [name]  DOB: [birth year]  MRN: [id number]  Age / Sex: 82 y.o., female  PCP: [facility] Referring Physician: Lanae Boast, MD  Reason for Consultation: Establishing goals of care  HPI/Patient Profile: 82 y.o. female  with past medical history of hypertension, hyperlipidemia, CAD s/p CABG and PCI, paroxysmal atrial fibrillation no longer on oral anticoagulation due to history of recurrent GI bleed, ESRD on HD (MWF), seizure disorder, hypothyroidism, history of small bowel AVMs, admitted on 12/14/2022 with chest pain after HD.   Patient was recently admitted to [facility] from 5/22 to 6/4 due to AVM (arteriovenous malformation) of small bowel. She has had 5 admissions and 5 ED visits in the past 6 months. Currently admitted for Aflutter and started on IV amiodarone, after which she developed acute on chronic CHF and fluid overload with uncontrolled HTN, acute metabolic encephalopathy. Blood pressure also low during HD. PMT has been consulted to assist with goals of care conversation.  Clinical Assessment and Goals of Care:  I have reviewed medical records including EPIC notes, labs and imaging, assessed the patient and then met at the bedside with patient's husband, daughter, and grandson to discuss diagnosis prognosis, GOC, EOL wishes, disposition and options.  I introduced Palliative Medicine as specialized medical care for people living with serious illness. It focuses on providing relief from the symptoms and stress of a serious illness. The goal is to improve quality of life for both the patient and the family.  We discussed a brief life review of the patient and then focused on their current illness.   I attempted to elicit values and goals of care important to the patient.    Medical History Review and Understanding:  Discussed patient's acute  illness in the context of her chronic comorbidities. Patient has had several major health events from which she has recovered in the past. Reviewed poor prognostic indicators including blood pressure liability during HD, weakness, and decreased appetite.   Social History: Patient was at SNF prior to admission and the ultimate goal is to return home with her husband. Her daughter lives in [place]. She is a Curator.  Functional and Nutritional State: Patient with months of cognitive and functional decline and recurrent hospitalizations. Albumin of 2.8 noted on 6/5.     Palliative Symptoms: Cough, lethargy, back pain (improved upon revisit to bedside in afternoon)  Advance Directives: A detailed discussion regarding advanced directives was had.   Code Status: Recommended consideration of DNR status, understanding evidenced-based poor outcomes in similar hospitalized patients, as the cause of the arrest is likely associated with chronic/terminal disease rather than a reversible acute cardio-pulmonary event.   Discussion: Reviewed patient's quality of life and overall health, trajectory since last discussion with PMT in February. Patient is disoriented and falls asleep several times during our attempt to discuss her goals and wishes. She nods along to recommendations for DNR and debriefing of her medical issues, though it is not clear whether she actually understands what this PA is saying. Her family is hopeful for improvement while also understanding that she is at high risk for further decline, particularly as her body is having a harder and harder time tolerating effective HD while also requiring more sessions than usual. They are committed to honoring her wishes and hope that she will be able to clearly state what she wants and does not want in the coming days. Family does not feel she is ready for discharge  yet. They never saw outpatient palliative care despite PMT requesting a referral to a  community-based program. They would be agreeable to attempting a referral again for ongoing goals of care discussions after this hospitalization.   The difference between aggressive medical intervention and comfort care was considered in light of the patient's goals of care. Hospice and Palliative Care services outpatient were explained and offered.   Discussed the importance of continued conversation with family and the medical providers regarding overall plan of care and treatment options, ensuring decisions are within the context of the patient's values and GOCs.   Questions and concerns were addressed.  Hard Choices booklet left for review. The family was encouraged to call with questions or concerns.  PMT will continue to support holistically.   SUMMARY OF RECOMMENDATIONS   -Continue full code/full scope treatment for now -Ongoing goals of care conversations -Patient's family are hopeful that she becomes more alert and participates in GOC discussions/decision-making herself -PMT will continue to follow and support  Prognosis:  Poor long-term prognosis  Discharge Planning: Skilled Nursing Facility for rehab with Palliative care service follow-up      Primary Diagnoses: Present on Admission:  Paroxysmal atrial flutter (HCC)  Wide-complex tachycardia  Essential hypertension  Mixed hyperlipidemia  Acquired hypothyroidism  Macrocytic anemia  Physical Exam Vitals and nursing note reviewed.  Constitutional:      General: She is not in acute distress. Cardiovascular:     Rate and Rhythm: Normal rate.  Pulmonary:     Effort: Pulmonary effort is normal. No respiratory distress.  Neurological:     Mental Status: She is easily aroused. She is lethargic and disoriented.  Psychiatric:        Mood and Affect: Mood normal.        Behavior: Behavior normal.    Vital Signs: BP (!) 170/67 (BP Location: Right Arm)   Pulse 76   Temp 98.2 F (36.8 C) (Oral)   Resp 19   Ht 5\' 2"   (1.575 m)   Wt 49.5 kg   SpO2 98%   BMI 19.96 kg/m  Pain Scale: 0-10   Pain Score: 0-No pain   SpO2: SpO2: 98 % O2 Device:SpO2: 98 % O2 Flow Rate: .O2 Flow Rate (L/min): 2 L/min   Palliative Assessment/Data: 20%     MDM: high    Telesia Ates Jeni Salles, PA-C  Palliative Medicine Team Team phone # [phone]  Thank you for allowing the Palliative Medicine Team to assist in the care of this patient. Please utilize secure chat with additional questions, if there is no response within 30 minutes please call the above phone number.  Palliative Medicine Team providers are available by phone from 7am to 7pm daily and can be reached through the team cell phone.  Should this patient require assistance outside of these hours, please call the patient's attending physician.

## 2022-12-21 NOTE — Progress Notes (Signed)
PROGRESS NOTE Shawna [name]  WUJ:[id number] DOB: Sep 21, 1940 DOA: 12/14/2022 PCP: [facility]  Brief Narrative/Hospital Course: 82 year old with past medical history significant for hypertension, hyperlipidemia, CAD status post CABG and PCI, paroxysmal A-fib, no longer on anticoagulation due to recurrent GI bleed, ESRD on hemodialysis (MWF), seizure disorder, hypothyroidism, history of pulmonary. Presented to the ED complaining of chest pain while she was returning from dialysis the day of admission. Reports chest pain sharp in nature, 10 out of 10 associated with palpitation. Patient admission to [facility]/due to AVM, received blood transfusion and was treated with argon plasma hospitalization. Developed paroxysmal Atrial flutter which was difficult to control she was placed on amiodarone and Coreg.  Seen by cardiology now on metoprolol, amiodarone.  Patient not on anticoagulation due to prior history of GI bleeding. ? Of Wide complex tachycardia, axis no change this is related to her left bundle branch block suspicious for atrial tachycardia versus atrial flutter- seen by cardiology. Patient underwent dialysis 6/10.  Seen by cardiology and signed off with planned outpatient follow-up in 2 weeks with Dr. Theora Gianotti nephrology no further needs and patient to be discharged to skilled nursing facility    Subjective: Seen In HD Getting blood No complaints Feeling weak Overnight hemoglobin ocelot/6.6 g 1 unit PRBC transfused in dialysis   Assessment and Plan: Principal Problem:   Paroxysmal atrial flutter (HCC) Active Problems:   Macrocytic anemia   End-stage renal disease on hemodialysis (HCC)   Essential hypertension   Diabetes mellitus type 2 in nonobese [facility])   Acquired hypothyroidism   Mixed hyperlipidemia   Wide-complex tachycardia   Atrial tachycardia/paroxysmal atrial fibrillation/flutter/?WCT:  axis had no changes so per cardio this is related to her left bundle branch block  likely from atrial tachycardia versus atrial flutter that showed as WCT. Cont rhythm control strategy with amiodarone 200 twice daily x 7 days then 200 mg daily, metoprolol 25 twice daily.  She is not on anticoagulation due to history of GI bleed.   Chronic systolic CHF with EF 30-35% CAD status post CABG with residual small vessel disease Chest pain due to demand ischemia: Seen by cardiology,known history of CAB-G vein grafts are patent however has residual small vessel disease that is being managed medically suspect her angina was related to tachycardia.Continue BP control strategy, statin. Not on aspirin due to history of GI bleeding.  Having fluid overload being managed with HD  Volume overload with shortness of breath : Underwent extra dialysis 6/11 given findings on chest x-ray with congestion and patient having shortness of breath.  Difficult to ultrafiltrate given hypotension in HD. Cont per nephro ESRD on HD NFA:[id number] following for HD- got extra HD 6/11 and again on dialysis 6/12 today per schedule  Anemia of chronic renal disease:hemoglobin again dropped to 6.6 g transfuse 1 unit PRBC with HD.  Previous transfusion was on 5/22.  She has history of GI bleed but denies dark stool or any bleeding.  ESA per nephrology Recent Labs  Lab 12/14/22 1857 12/18/22 0138 12/20/22 0858 12/21/22 0034  HGB 7.7* 7.5* 8.6* 6.6*  HCT 24.0* 23.3* 26.9* 20.0*    Lethargy-confusion-suspect acute metabolic encephalopathy in the setting of multiple comorbidities weakness deconditioning: More alert awake today and less short of breath., cont HD per schedule. ABG without CO2 retention.   Secondary hyperparathyroidism Phos at goal holding sevelamer for now. Continue calcitriol and Sensipar 3x weekly per nephrology    Hypertension:BP fluctuating hypotensive during as day otherwise hypertensive.  Continue hydralazine, metoprolol -  cont per nehrology  Hyperlipidemia:Continue statin Type diabetes  mellitus diet controlled Acquired hypothyroidism on levothyroxine.  GOC: She is weak and frail, high risk for readmission, decompensation has chronic systolic CHF with CAD currently on dialysis dependent. I called and updated patient's daughter 6/11.  Palliative care was consulted they have discussed with patient and family at this time remains full code.  Patient has been educated to consider DNR/DNI understanding evidenced-based poor outcomes in similar hospitalized ptient, as a cause of arrest is likely associated with advanced chronic illness rather than easily reversible acute cardiopulmonary.    DVT prophylaxis: SCDs Start: 12/14/22 2155 Code Status:   Code Status: Full Code Family Communication: plan of care discussed with patient at bedside. Patient status is: Inpatient because of shortness of breath, arteriosclerosis of PRN Level of care: Telemetry Medical   Dispo: The patient is from: home with husband, but currently in SNF since last week            Anticipated disposition: SNF likely tomorrow if remains hemodynamically stable Objective: Vitals last 24 hrs: Vitals:   12/21/22 1130 12/21/22 1148 12/21/22 1201 12/21/22 1232  BP: (!) 168/64 (!) 169/63 (!) 189/69 (!) 170/67  Pulse: 66 70 (!) 57 76  Resp: 18 20 19 19   Temp:   98.1 F (36.7 C) 98.2 F (36.8 C)  TempSrc:   Oral Oral  SpO2: 100% 99% 97% 98%  Weight:   49.5 kg   Height:       Weight change: -3.6 kg  Physical Examination: General exam: AA, weak,older appearing HEENT:Oral mucosa moist, Ear/Nose WNL grossly, dentition normal. Respiratory system: bilaterally diminished BS, no use of accessory muscle Cardiovascular system: S1 & S2 +, regular rate. Gastrointestinal system: Abdomen soft, NT,ND,BS+ Nervous System:Alert, awake, moving extremities and grossly nonfocal Extremities: LE ankle edema neg, lower extremities warm Skin: No rashes,no icterus. MSK: Normal muscle bulk,tone, power   Medications reviewed:   Scheduled Meds:  sodium chloride   Intravenous Once   amiodarone  200 mg Oral BID   [START ON 12/23/2022] amiodarone  200 mg Oral Daily   atorvastatin  10 mg Oral QHS   calcitRIOL  1.75 mcg Oral Q M,W,F-HD   Chlorhexidine Gluconate Cloth  6 each Topical Q0600   Chlorhexidine Gluconate Cloth  6 each Topical Q0600   Chlorhexidine Gluconate Cloth  6 each Topical Q0600   cinacalcet  30 mg Oral Q M,W,F-HD   darbepoetin (ARANESP) injection - DIALYSIS  150 mcg Subcutaneous Q Mon-1800   gabapentin  100 mg Oral QPM   hydrALAZINE  50 mg Oral Q8H   levETIRAcetam  500 mg Oral Daily   levothyroxine  75 mcg Oral QAC breakfast   metoprolol tartrate  25 mg Oral BID   Continuous Infusions:    Diet Order             Diet heart healthy/carb modified Room service appropriate? Yes; Fluid consistency: Thin  Diet effective now                   Intake/Output Summary (Last 24 hours) at 12/21/2022 1526 Last data filed at 12/21/2022 1201 Gross per 24 hour  Intake 365 ml  Output 4900 ml  Net -4535 ml    Net IO Since Admission: -7,044.1 mL [12/21/22 1526]  Wt Readings from Last 3 Encounters:  12/21/22 49.5 kg  12/12/22 56.9 kg  11/18/22 59.4 kg     Unresulted Labs (From admission, onward)     Start  Ordered   12/18/22 0500  CBC  Daily,   R     Question:  Specimen collection method  Answer:  Lab=Lab collect   12/17/22 1216   12/18/22 0500  Basic metabolic panel  Daily,   R     Question:  Specimen collection method  Answer:  Lab=Lab collect   12/17/22 1216          Data Reviewed: I have personally reviewed following labs and imaging studies CBC: Recent Labs  Lab 12/14/22 1857 12/18/22 0138 12/20/22 0858 12/21/22 0034  WBC 7.0 5.6 10.3 8.9  NEUTROABS 5.6  --   --   --   HGB 7.7* 7.5* 8.6* 6.6*  HCT 24.0* 23.3* 26.9* 20.0*  MCV 110.6* 110.4* 111.6* 111.1*  PLT 259 236 235 231    Basic Metabolic Panel: Recent Labs  Lab 12/14/22 1857 12/18/22 0138 12/20/22 0858  12/21/22 0034  NA 134* 131* 132* 131*  K 3.5 3.9 4.4 4.4  CL 96* 91* 94* 94*  CO2 26 27 25  21*  GLUCOSE 120* 100* 90 85  BUN 10 18 15  25*  CREATININE 2.95* 5.31* 5.08* 6.10*  CALCIUM 8.0* 8.5* 8.4* 8.1*  PHOS  --  3.2  --   --    GFR: Estimated Creatinine Clearance: 5.6 mL/min (A) (by C-G formula based on SCr of 6.1 mg/dL (H)). Liver Function Tests: Recent Labs  Lab 12/14/22 1857  AST 18  ALT 12  ALKPHOS 85  BILITOT 0.8  PROT 5.8*  ALBUMIN 2.8*    No results found for this or any previous visit (from the past 240 hour(s)).  Antimicrobials: Anti-infectives (From admission, onward)    None      Culture/Microbiology    Component Value Date/Time   SDES BLOOD LEFT HAND 12/04/2022 2013   SPECREQUEST  12/04/2022 2013    BOTTLES DRAWN AEROBIC AND ANAEROBIC Blood Culture adequate volume   CULT  12/04/2022 2013    NO GROWTH 5 DAYS Performed at [facility], [address] [place], [place] [address]    REPTSTATUS 12/09/2022 FINAL 12/04/2022 2013   Radiology Studies: [facility] Chest Port 1 View  Result Date: 12/20/2022 CLINICAL DATA:  Shortness of breath EXAM: PORTABLE CHEST 1 VIEW COMPARISON:  Previous studies including the examination done on 12/14/2022 FINDINGS: Transverse diameter of heart is increased. Central pulmonary vessels are more prominent. There is increase in interstitial markings in the parahilar regions. There is no focal pulmonary consolidation. There is blunting of both lateral CP angles. There is interval decrease in amount of bilateral pleural effusions. Vascular stent is seen in left upper arm. Degenerative changes are noted in right shoulder. There is previous coronary bypass surgery. Tip of left IJ central venous catheter is seen in right atrium. IMPRESSION: Cardiomegaly. Central pulmonary vessels are more prominent suggesting CHF. There is subtle increase in interstitial markings in the parahilar regions suggesting possible mild interstitial edema. Small  bilateral pleural effusions with interval decrease in size. Electronically Signed   By: Ernie Avena M.D.   On: 12/20/2022 13:30     LOS: 7 days   Lanae Boast, MD [facility]  12/21/2022, 3:26 PM

## 2022-12-21 NOTE — Telephone Encounter (Signed)
Per Windy Fast they have not received the patient assistance form from [place]. I did fax It on 12/19/2022 and I re faxed it again today.

## 2022-12-21 NOTE — Progress Notes (Signed)
   Referral received for Shawna [name] :goals of care discussion. Chart reviewed and updates received from RN. Patient assessed and is unable to engage appropriately in discussions.   I was able to speak with [name] briefly by phone. She is currently driving to [facility] and is unable to participate in detailed GOC discussion, though she anticipates arriving here in 1-2 hours. PMT contact information was provided.  PMT will re-attempt to contact family at a later time/date. Detailed note and recommendations to follow once GOC has been completed.   Thank you for your referral and allowing PMT to assist in [name]'s care.   Richardson Dopp, [facility] Palliative Medicine Team  Team Phone # [phone]   NO CHARGE

## 2022-12-21 NOTE — Telephone Encounter (Signed)
I have sent a message to Shawna [name] asked that he please check status of patient's assistance for free medication.

## 2022-12-21 NOTE — Progress Notes (Signed)
POST HD TX NOTE  12/21/22 1201  Vitals  Temp 98.1 F (36.7 C)  Temp Source Oral  BP (!) 189/69  MAP (mmHg) 102  BP Location Right Arm  BP Method Automatic  Patient Position (if appropriate) Lying  Pulse Rate (!) 57  Pulse Rate Source Monitor  ECG Heart Rate 78  Resp 19  Oxygen Therapy  SpO2 (!) 87 %  O2 Device Nasal Cannula  O2 Flow Rate (L/min) 2 L/min  Pulse Oximetry Type Continuous  During Treatment Monitoring  Intra-Hemodialysis Comments (S)   (post HD tx VS check)  Post Treatment  Dialyzer Clearance Heavily streaked (manu clots on top of dialyzer)  Duration of HD Treatment -hour(s) 3.5 hour(s)  Hemodialysis Intake (mL) 515 mL ( PRBC +6ml PRBC line flush w/ NS, + NS bolus)  Liters Processed 84  Fluid Removed (mL) 2400 mL  Tolerated HD Treatment (S)  Yes (but pt continues to have bp issues throughout tx and when you try to fix it, it rebounds too high)  Post-Hemodialysis Comments (S)  tx completed w/ bp issues intermittently, but less this time than yesterday, UF was off for a bit d/t low bp. Pt also got 1 unit PRBC.  UF goal still met, blood rinsed back VSS. Medication Admin: Heparin Dwells 3800 units, Heparin bolus was held d/t hgb 6.6 today.  Hemodialysis Catheter Left Subclavian Double lumen Temporary (Non-Tunneled)  Placement Date: 06/09/22   Placed prior to admission: Yes  Orientation: Left  Access Location: Subclavian  Hemodialysis Catheter Type: Double lumen Temporary (Non-Tunneled)  Site Condition No complications  Blue Lumen Status Heparin locked;Dead end cap in place  Red Lumen Status Heparin locked;Dead end cap in place  Purple Lumen Status N/A  Catheter fill solution Heparin 1000 units/ml  Catheter fill volume (Arterial) 1.9 cc  Catheter fill volume (Venous) 1.9  Dressing Type Transparent  Dressing Status Antimicrobial disc in place;Clean, Dry, Intact  Drainage Description None  Dressing Change Due 12/22/22  Post treatment catheter status  Capped and Clamped

## 2022-12-22 ENCOUNTER — Inpatient Hospital Stay (HOSPITAL_COMMUNITY): Payer: Medicare Other

## 2022-12-22 DIAGNOSIS — I4892 Unspecified atrial flutter: Secondary | ICD-10-CM | POA: Diagnosis not present

## 2022-12-22 DIAGNOSIS — Z711 Person with feared health complaint in whom no diagnosis is made: Secondary | ICD-10-CM

## 2022-12-22 DIAGNOSIS — Z515 Encounter for palliative care: Secondary | ICD-10-CM

## 2022-12-22 DIAGNOSIS — N186 End stage renal disease: Secondary | ICD-10-CM | POA: Diagnosis not present

## 2022-12-22 DIAGNOSIS — R000 Tachycardia, unspecified: Secondary | ICD-10-CM | POA: Diagnosis not present

## 2022-12-22 DIAGNOSIS — Z789 Other specified health status: Secondary | ICD-10-CM

## 2022-12-22 LAB — RESPIRATORY PANEL BY PCR
Adenovirus: NOT DETECTED
Bordetella Parapertussis: NOT DETECTED
Bordetella pertussis: NOT DETECTED
Chlamydophila pneumoniae: NOT DETECTED
Coronavirus 229E: NOT DETECTED
Coronavirus HKU1: NOT DETECTED
Coronavirus NL63: NOT DETECTED
Coronavirus OC43: NOT DETECTED
Influenza A: NOT DETECTED
Influenza B: NOT DETECTED
Metapneumovirus: DETECTED — AB
Mycoplasma pneumoniae: NOT DETECTED
Parainfluenza Virus 1: NOT DETECTED
Parainfluenza Virus 2: NOT DETECTED
Parainfluenza Virus 3: NOT DETECTED
Parainfluenza Virus 4: NOT DETECTED
Respiratory Syncytial Virus: NOT DETECTED
Rhinovirus / Enterovirus: NOT DETECTED

## 2022-12-22 LAB — BASIC METABOLIC PANEL
Anion gap: 13 (ref 5–15)
BUN: 14 mg/dL (ref 8–23)
CO2: 26 mmol/L (ref 22–32)
Calcium: 8 mg/dL — ABNORMAL LOW (ref 8.9–10.3)
Chloride: 94 mmol/L — ABNORMAL LOW (ref 98–111)
Creatinine, Ser: 4.15 mg/dL — ABNORMAL HIGH (ref 0.44–1.00)
GFR, Estimated: 10 mL/min — ABNORMAL LOW (ref >60)
Glucose, Bld: 93 mg/dL (ref 70–99)
Potassium: 3.9 mmol/L (ref 3.5–5.1)
Sodium: 133 mmol/L — ABNORMAL LOW (ref 135–145)

## 2022-12-22 LAB — CBC
HCT: 28.8 % — ABNORMAL LOW (ref 36.0–46.0)
Hemoglobin: 9.4 g/dL — ABNORMAL LOW (ref 12.0–15.0)
MCH: 32.5 pg (ref 26.0–34.0)
MCHC: 32.6 g/dL (ref 30.0–36.0)
MCV: 99.7 fL (ref 80.0–100.0)
Platelets: 197 10*3/uL (ref 150–400)
RBC: 2.89 MIL/uL — ABNORMAL LOW (ref 3.87–5.11)
RDW: 26.5 % — ABNORMAL HIGH (ref 11.5–15.5)
WBC: 6.8 10*3/uL (ref 4.0–10.5)
nRBC: 0 % (ref 0.0–0.2)

## 2022-12-22 LAB — TYPE AND SCREEN

## 2022-12-22 LAB — GLUCOSE, CAPILLARY: Glucose-Capillary: 138 mg/dL — ABNORMAL HIGH (ref 70–99)

## 2022-12-22 LAB — BPAM RBC
ISSUE DATE / TIME: 202406120826
Unit Type and Rh: 5100

## 2022-12-22 LAB — SARS CORONAVIRUS 2 BY RT PCR: SARS Coronavirus 2 by RT PCR: NEGATIVE

## 2022-12-22 MED ORDER — CHLORHEXIDINE GLUCONATE CLOTH 2 % EX PADS
6.0000 | MEDICATED_PAD | Freq: Every day | CUTANEOUS | Status: DC
  Administered 2022-12-23 – 2022-12-27 (×5): 6 via TOPICAL

## 2022-12-22 NOTE — Progress Notes (Signed)
Daily Progress Note   Patient Name: Shawna [name]       Date: 12/22/2022 DOB: Jan 05, 1941  Age: 82 y.o. MRN#: [id number] Attending Physician: Lanae Boast, MD Primary Care Physician: [facility], [facility] Date: 12/14/2022  Reason for Consultation/Follow-up: Establishing goals of care  Subjective: I have reviewed medical records including EPIC notes, MAR, and labs. Patient has tested positive metapneumovirus. Received report from primary RN - no acute concerns. Per RN, patient's oral intake remains minimal and mental status wax/wanes. According to RN, patient was able to feed herself some of breakfast but has been sleeping since.   Went to visit patient at bedside - no family/visitors present. Patient was lying in bed asleep - she easily arouses to voice/gentle touch but does appear weak and tired. She is only briefly awake enough to say hello. She quickly falls back asleep and only responds to follow up questions weakly with noding/shaking her head with stimulation to stay awake. I do not believe she is able to make complex medical decisions at this time. Patient is on 2L O2 [place].  12:35 PM Called daughter/Mareka - emotional support provided. She has visited with patient in the hospital today and understands patient's current acute medical situation. Code status discussion follow up was had - she reports patient's spouse is the decision maker. He has been reading the Hard Choices book and finding it helpful. Deno Etienne tells me she is in support of DNR/DNI; however, is not sure her father/patient's husband is agreeable. Family are still hopeful patient may be able to make her own decisions in the near future. Expressed concern and uncertainty if patient will be able to make complex medical decisions in  context of her ongoing decline - explained that family may have to make decisions for her. Mareka expressed understanding. She has no further PMT needs today.  12:50 PM Called spouse/Nathaniel - emotional support provided. Code status discussion continued. [name] tells me he wants "to do anything to keep her alive." Encouraged him to consider DNR/DNI status understanding evidenced based poor outcomes in similar hospitalized patient, as the cause of arrest is likely associated with advanced chronic/terminal illness rather than an easily reversible acute cardio-pulmonary event.  I shared that even if we pursued resuscitation we would not able to resolve the underlying factors. I explained that DNR/DNI does not  change the medical plan and it only comes into effect after a person has arrested (died).  It is a protective measure to keep [place] from harming the patient in their last moments of life. For now, goals are to continue full code with understanding that she would receive CPR, defibrillation, ACLS medications, or intubation.   All questions and concerns addressed. Encouraged to call with questions and/or concerns. PMT card previously provided.  Length of Stay: 8  Current Medications: Scheduled Meds:   amiodarone  200 mg Oral BID   [START ON 12/23/2022] amiodarone  200 mg Oral Daily   atorvastatin  10 mg Oral QHS   calcitRIOL  1.75 mcg Oral Q M,W,F-HD   Chlorhexidine Gluconate Cloth  6 each Topical Q0600   cinacalcet  30 mg Oral Q M,W,F-HD   darbepoetin (ARANESP) injection - DIALYSIS  150 mcg Subcutaneous Q Mon-1800   gabapentin  100 mg Oral QPM   hydrALAZINE  50 mg Oral Q8H   levETIRAcetam  500 mg Oral Daily   levothyroxine  75 mcg Oral QAC breakfast   metoprolol tartrate  25 mg Oral BID    Continuous Infusions:   PRN Meds: acetaminophen **OR** acetaminophen, ALPRAZolam, benzonatate, guaiFENesin-dextromethorphan, ondansetron **OR** ondansetron (ZOFRAN) IV  Physical Exam Vitals and  nursing note reviewed.  Constitutional:      General: She is not in acute distress.    Appearance: She is ill-appearing.  Pulmonary:     Effort: No respiratory distress.  Skin:    General: Skin is warm and dry.  Neurological:     Mental Status: She is lethargic.     Motor: Weakness present.  Psychiatric:        Behavior: Behavior is cooperative.             Vital Signs: BP (!) 105/40 (BP Location: Right Arm)   Pulse 60   Temp 98.9 F (37.2 C) (Oral)   Resp 19   Ht 5\' 2"  (1.575 m)   Wt 49.5 kg   SpO2 100%   BMI 19.96 kg/m  SpO2: SpO2: 100 % O2 Device: O2 Device: Nasal Cannula O2 Flow Rate: O2 Flow Rate (L/min): 2 L/min  Intake/output summary: No intake or output data in the 24 hours ending 12/22/22 1230 LBM: Last BM Date : 12/19/22 Baseline Weight: Weight: 56.9 kg Most recent weight: Weight: 49.5 kg       Palliative Assessment/Data: PPS 20-30%      Patient Active Problem List   Diagnosis Date Noted   Paroxysmal atrial flutter (HCC) 12/14/2022   Pressure injury of skin 12/01/2022   Rectal bleeding 11/30/2022   Generalized abdominal pain 11/10/2022   Ileus (HCC) 11/04/2022   Periumbilical abdominal pain 11/04/2022   Macrocytic anemia 10/29/2022   Anemia of chronic disease 10/29/2022   Upper GI bleed 10/29/2022   Iron deficiency anemia due to chronic blood loss 08/26/2022   Benign neoplasm of transverse colon 08/11/2022   Benign neoplasm of descending colon 08/11/2022   Benign neoplasm of sigmoid colon 08/11/2022   Angiodysplasia of duodenum 08/11/2022   Delirium 04/22/2022   History of arteriovenous malformation (AVM) 04/22/2022   Acute metabolic encephalopathy 11/09/2021   NSTEMI, initial episode of care (HCC) 10/19/2021   Acute on chronic blood loss anemia 09/09/2021   Ischemic cardiomyopathy    Atrial fibrillation, chronic (HCC) 05/07/2021   Neuropathy 04/23/2021   Anemia associated with chronic renal failure 04/13/2021   Left knee pain 03/15/2021    Moderate protein-calorie malnutrition (HCC) 02/27/2021  Hypokalemia 02/27/2021   COVID-19 virus infection 02/03/2021   Leukocytosis 02/03/2021   Acquired hypothyroidism 02/03/2021   Myositis 12/03/2020   Ataxia 12/02/2020   Diabetes mellitus type 2 in nonobese (HCC) 11/10/2020   Failure to thrive in adult 11/09/2020   Dialysis AV fistula malfunction, initial encounter (HCC)    Jugular vein occlusion, right (HCC)    Failure of surgically constructed arteriovenous fistula (HCC) 10/03/2020   Myoclonus 08/31/2020   Clotted renal dialysis AV graft, initial encounter (HCC)    Hemodialysis-associated hypotension    Irritable bowel syndrome 02/25/2020   Adenomatous duodenal polyp 09/10/2019   History of GI bleed 09/10/2019   Hand steal syndrome (HCC) 08/01/2017   CAD (coronary artery disease) 06/05/2017   Intestinal ischemia (HCC)    Complication of vascular access for dialysis 03/19/2017   Preoperative clearance 01/25/2017   H/O non-ST elevation myocardial infarction (NSTEMI) 10/24/2016   Non-ST elevation (NSTEMI) myocardial infarction [facility])    Heme positive stool    Cardiac arrest [facility])    Palliative care encounter    Goals of care, counseling/discussion    Flash pulmonary edema (HCC) 04/06/2016   History of colon cancer 01/27/2016   History of ovarian cancer 01/27/2016   PAF (paroxysmal atrial fibrillation) (HCC) 10/14/2015   Malignant neoplasm of right ovary (HCC) 10/14/2015   Wide-complex tachycardia 09/08/2015   SVT (supraventricular tachycardia) 09/08/2015   Essential hypertension    Dyspnea    Constipation 03/12/2013   Abdominal wall pain in left flank 03/12/2013   Occlusion and stenosis of carotid artery without mention of cerebral infarction 01/24/2013   Hx of CABG 07/05/2012   Carotid artery disease (HCC) 07/05/2012   Mitral regurgitation 06/12/2012   Non-STEMI (non-ST elevated myocardial infarction) (HCC) 06/08/2012   Chronic combined systolic and diastolic CHF  (congestive heart failure) (HCC) 05/02/2012   AVM (arteriovenous malformation) of small bowel, acquired with hemorrhage 01/20/2012   GERD (gastroesophageal reflux disease) 01/09/2012   Mixed hyperlipidemia 01/05/2012   Atherosclerotic heart disease of native coronary artery without angina pectoris 12/16/2011   Anxiety disorder 05/04/2011   End-stage renal disease on hemodialysis (HCC) 04/29/2011   Gout 04/29/2011    Palliative Care Assessment & Plan   Patient Profile: 82 y.o. female  with past medical history of hypertension, hyperlipidemia, CAD s/p CABG and PCI, paroxysmal atrial fibrillation no longer on oral anticoagulation due to history of recurrent GI bleed, ESRD on HD (MWF), seizure disorder, hypothyroidism, history of small bowel AVMs, admitted on 12/14/2022 with chest pain after HD.    Patient was recently admitted to [facility] from 5/22 to 6/4 due to AVM (arteriovenous malformation) of small bowel. She has had 5 admissions and 5 ED visits in the past 6 months. Currently admitted for Aflutter and started on IV amiodarone, after which she developed acute on chronic CHF and fluid overload with uncontrolled HTN, acute metabolic encephalopathy. Blood pressure also low during HD. PMT has been consulted to assist with goals of care conversation.  Assessment: Principal Problem:   Paroxysmal atrial flutter (HCC) Active Problems:   Mixed hyperlipidemia   Essential hypertension   Wide-complex tachycardia   End-stage renal disease on hemodialysis (HCC)   Diabetes mellitus type 2 in nonobese [facility])   Acquired hypothyroidism   Macrocytic anemia   Concern about end of life  Recommendations/Plan: Continue full scope treatment Continue full code - daughter supports DNR/DNI; however, husband is not agreeable Family are still hopeful patient may be able to participate in GOC Comfort/hospice would be reasonable  for this patient if/when family agreeable. For now, outpatient Palliative Care will  follow - [facility] consult previously placed PMT will continue to follow peripherally. If there are any imminent needs please call the service directly   Goals of Care and Additional Recommendations: Limitations on Scope of Treatment: Full Scope Treatment  Code Status:    Code Status Orders  (From admission, onward)           Start     Ordered   12/14/22 2156  Full code  Continuous       Question:  By:  Answer:  Consent: discussion documented in EHR   12/14/22 2157           Code Status History     Date Active Date Inactive Code Status Order ID Comments User Context   11/30/2022 0323 12/14/2022 0116 Full Code [id number]  Darlin Drop, DO ED   10/29/2022 0709 11/15/2022 2301 Full Code [id number]  Frankey Shown, DO Inpatient   08/26/2022 1637 08/29/2022 2343 Full Code [id number]  Elgergawy, Leana Roe, MD ED   08/06/2022 0916 08/18/2022 0113 Full Code [id number]  Jonah Blue, MD ED   06/10/2022 2022 06/12/2022 1927 Full Code [id number]  Levie Heritage, DO Inpatient   04/21/2022 0425 04/27/2022 0031 Full Code [id number]  Zierle-Ghosh, Asia B, DO ED   11/08/2021 2151 11/25/2021 0126 Full Code [id number]  Eduard Clos, MD ED   10/19/2021 0415 10/21/2021 0254 Full Code [id number]  Lurline Del, MD ED   09/09/2021 1947 09/11/2021 0041 Full Code [id number]  Zierle-Ghosh, Asia B, DO ED   08/15/2021 2241 08/20/2021 2017 Full Code [id number]  Onnie Boer, MD Inpatient   07/16/2021 0723 07/24/2021 1800 Full Code [id number]  Cleora Fleet, MD ED   07/07/2021 0223 07/08/2021 2119 Full Code [id number]  Eduard Clos, MD ED   04/23/2021 2207 04/24/2021 1645 Full Code [id number]  Elgergawy, Leana Roe, MD Inpatient   04/09/2021 1448 04/10/2021 1838 Full Code [id number]  Vassie Loll, MD ED   03/15/2021 0345 03/17/2021 0021 Full Code [id number]  Bobette Mo, MD ED   03/15/2021 0345 03/15/2021 0345 Full Code [id number]  Bobette Mo, MD ED   02/27/2021 0629 02/28/2021 2011 Full Code  [id number]  Zierle-Ghosh, Asia B, DO ED   02/03/2021 2113 02/07/2021 1927 Full Code [id number]  Frankey Shown, DO ED   12/02/2020 2349 12/17/2020 1836 Full Code [id number]  Howerter, Chaney Born, DO ED   11/09/2020 2111 11/11/2020 1828 Full Code [id number]  Zierle-Ghosh, Asia B, DO Inpatient   10/03/2020 2255 10/07/2020 2324 Full Code [id number]  Levie Heritage, DO Inpatient   09/29/2020 0800 09/29/2020 1917 Full Code [id number]  Zierle-Ghosh, Asia B, DO ED   08/31/2020 0158 09/02/2020 2143 Full Code [id number]  Zierle-Ghosh, Asia B, DO ED   06/13/2020 0104 06/20/2020 2329 Full Code [id number]  Melene Plan, MD ED   05/06/2020 1522 05/07/2020 1850 Full Code [id number]  Cleora Fleet, MD ED   10/14/2019 1127 10/15/2019 1553 Full Code [id number]  Cleora Fleet, MD Inpatient   06/17/2019 1925 06/20/2019 2234 Full Code [id number]  Onnie Boer, MD Inpatient   06/03/2019 2346 06/06/2019 0125 Full Code [id number]  Briscoe Deutscher, MD Inpatient   03/08/2019 0911 03/10/2019 1722 Full Code [id number]  Zierle-Ghosh, [place] B, DO Inpatient   12/25/2018 2015 12/27/2018 2134 Full Code [id number]  Eduard Clos, MD Inpatient   12/14/2018 0422 12/15/2018 1939 Full Code [id number]  Pearson Grippe, MD ED  07/13/2018 0825 07/14/2018 1834 Full Code [id number]  Eddie North, MD ED   07/13/2018 0503 07/13/2018 0824 Full Code [id number]  Bobette Mo, MD ED   07/13/2018 0503 07/13/2018 0503 Full Code [id number]  Bobette Mo, MD ED   02/13/2018 1628 02/15/2018 2327 Full Code [id number]  Haydee Monica, MD ED   10/15/2017 0948 10/16/2017 2208 Full Code [id number]  Vassie Loll, MD Inpatient   07/14/2017 0849 07/15/2017 1727 Full Code [id number]  Cleora Fleet, MD ED   06/06/2017 1458 06/06/2017 1958 Full Code [id number]  Schnier, Latina Craver, MD Inpatient   03/22/2017 0820 04/05/2017 1850 Full Code [id number]  Dorothea Ogle, MD Inpatient   02/08/2017 2200 02/09/2017 1959 Full Code [id number]  Arty Baumgartner, NP Inpatient   01/20/2017 0526  01/21/2017 1446 Full Code [id number]  Hillary Bow, DO ED   10/24/2016 2154 10/25/2016 1832 Full Code [id number]  Michael Litter, MD ED   10/10/2016 0418 10/14/2016 2252 Full Code [id number]  Clydie Braun, MD ED   09/19/2016 1735 09/22/2016 2031 Full Code [id number]  Marlon Pel, PA-C Inpatient   09/07/2016 0339 09/08/2016 1752 Full Code [id number]  Haydee Monica, MD ED   07/27/2016 1147 08/02/2016 2308 Full Code [id number]  Houston Siren, MD ED   07/08/2016 2339 07/10/2016 2119 Full Code [id number]  Eduard Clos, MD ED   05/09/2016 0138 05/26/2016 1924 Full Code [id number]  Briscoe Deutscher, MD ED   04/06/2016 0510 04/08/2016 2106 Full Code [id number]  [name], MD Inpatient   01/27/2016 2313 01/28/2016 2245 Full Code [id number]  Delano Metz, MD Inpatient   09/08/2015 1806 09/10/2015 1939 Full Code [id number]  Henderson Cloud, MD Inpatient   08/30/2015 1756 09/03/2015 1938 Full Code [id number]  Standley Brooking, MD Inpatient   05/03/2015 1809 05/05/2015 1601 Full Code [id number]  Wilson Singer, MD ED   01/21/2015 2014 01/27/2015 2100 Full Code [id number]  Kathlene Cote, PA-C Inpatient   09/30/2014 0322 10/02/2014 1435 Full Code [id number]  Floydene Flock, MD Inpatient   08/24/2014 0947 08/25/2014 1819 Full Code [id number]  Henderson Cloud, MD Inpatient   12/10/2013 1508 12/10/2013 2203 Full Code [id number]  Corky Crafts, MD Inpatient   11/26/2013 1548 11/28/2013 2145 Full Code [id number]  Alba Cory, MD ED   05/29/2013 1108 06/01/2013 1634 Full Code [id number]  Dede Query, MD ED   03/21/2013 1257 03/27/2013 2009 Full Code [id number]  Jeralyn Bennett, MD Inpatient   09/05/2012 2323 09/06/2012 1651 Full Code [id number]  Dede Query, MD Inpatient   06/17/2012 0932 06/20/2012 1908 Full Code [id number]  Delight Ovens, MD Inpatient   06/13/2012 1425 06/17/2012 0932 Full Code [id number]  Salley Slaughter, RN Inpatient      Advance Directive Documentation    Flowsheet Row Most Recent Value   Type of Advance Directive Out of facility DNR (pink MOST or yellow form)   [pink MOST form]  Pre-existing out of facility DNR order (yellow form or pink MOST form) --  "MOST" Form in Place? --       Prognosis:  Poor long term  Discharge Planning: Skilled Nursing Facility for rehab with Palliative care service follow-up  Care plan was discussed with primary RN, patient's daughter and spouse  Thank you for allowing the Palliative Medicine Team to assist in the care of this patient.   Total Time 50 minutes Prolonged Time Billed  no       Greater than  50%  of this time was spent counseling and coordinating care related to the above assessment and plan.  Haskel Khan, NP  Please contact Palliative Medicine Team phone at [phone] for questions and concerns.   *Portions of this note are a verbal dictation therefore any spelling and/or grammatical errors are due to the "Dragon [employer] One" system interpretation.

## 2022-12-22 NOTE — Progress Notes (Signed)
[facility] Progress Note   Subjective:   Patient seen and examined at bedside.  Febrile with tmax 101.1 overnight. Reports blurry vision, congestion and tiredness this AM.  Denies CP, SOB, HA and n/v/d.  Respiratory panel positive for Metapneumovirus.   Objective Vitals:   12/22/22 0453 12/22/22 0603 12/22/22 0730 12/22/22 1112  BP:   101/69 (!) 105/40  Pulse:   67 60  Resp: (!) 22 (!) 22 (!) 21 19  Temp: (!) 100.6 F (38.1 C) 99.9 F (37.7 C) 100.3 F (37.9 C) 98.9 F (37.2 C)  TempSrc: Oral Oral Oral Oral  SpO2:   99% 100%  Weight:      Height:       Physical Exam General:alert, ill appearing female in NAD Heart: IRIR, no mrg Lungs:scattered rhonchi, nml WOB on 2L O2 Abdomen:soft, NTND Extremities:no LE edema Dialysis Access: [facility]   Filed Weights   12/21/22 0748 12/21/22 1201 12/22/22 0437  Weight: 51.9 kg 49.5 kg 49.5 kg   No intake or output data in the 24 hours ending 12/22/22 1317  Additional Objective Labs: Basic Metabolic Panel: Recent Labs  Lab 12/18/22 0138 12/20/22 0858 12/21/22 0034 12/22/22 0010  NA 131* 132* 131* 133*  K 3.9 4.4 4.4 3.9  CL 91* 94* 94* 94*  CO2 27 25 21* 26  GLUCOSE 100* 90 85 93  BUN 18 15 25* 14  CREATININE 5.31* 5.08* 6.10* 4.15*  CALCIUM 8.5* 8.4* 8.1* 8.0*  PHOS 3.2  --   --   --    CBC: Recent Labs  Lab 12/18/22 0138 12/20/22 0858 12/21/22 0034 12/22/22 0010  WBC 5.6 10.3 8.9 6.8  HGB 7.5* 8.6* 6.6* 9.4*  HCT 23.3* 26.9* 20.0* 28.8*  MCV 110.4* 111.6* 111.1* 99.7  PLT 236 235 231 197   CBG: Recent Labs  Lab 12/22/22 1121  GLUCAP 138*    Studies/Results: DG Chest Port 1 View  Result Date: 12/22/2022 CLINICAL DATA:  82 year old female with fever. EXAM: PORTABLE CHEST 1 VIEW COMPARISON:  Portable chest 12/20/2022 and earlier. FINDINGS: Portable AP semi upright view at 0859 hours. Prior CABG. Stable cardiac size and mediastinal contours. Extensive Calcified aortic atherosclerosis. left chest  dual lumen dialysis type catheter. Left axillary vascular stent. Small left pleural effusion, left lung base ventilation appears mildly improved. No pneumothorax or pulmonary edema. No new pulmonary opacity. Stable visualized osseous structures. Negative visible bowel gas. IMPRESSION: 1. Small left pleural effusion with improved left lung base ventilation since 12/20/2022. 2. No new cardiopulmonary abnormality. 3.  Aortic Atherosclerosis (ICD10-I70.0). Electronically Signed   By: Odessa Fleming M.D.   On: 12/22/2022 09:36    Medications:   amiodarone  200 mg Oral BID   [START ON 12/23/2022] amiodarone  200 mg Oral Daily   atorvastatin  10 mg Oral QHS   calcitRIOL  1.75 mcg Oral Q M,W,F-HD   Chlorhexidine Gluconate Cloth  6 each Topical Q0600   cinacalcet  30 mg Oral Q M,W,F-HD   darbepoetin (ARANESP) injection - DIALYSIS  150 mcg Subcutaneous Q Mon-1800   gabapentin  100 mg Oral QPM   hydrALAZINE  50 mg Oral Q8H   levETIRAcetam  500 mg Oral Daily   levothyroxine  75 mcg Oral QAC breakfast   metoprolol tartrate  25 mg Oral BID    Dialysis Orders: [facility] EDW 58.5kg TDC 2K 2.5Ca BFR 300 DFR 500 Calcitrol 1.9mcg PO q HD Sensipar 30mg  PO q HD Mircera 200 mcg IV q 2 weeks- Last  dose 11/28/22   Assessment/Plan: 1.  Paroxysmal Afib - Cardiology following; Continue Metoprolol and Amiodarone. Not on AC d/t recent history GIB 2. Metapneumovirus - per respiratory panel. Fever overnight.  Management per PMD.  3. Shortness of breath - Improved.  CXR improved today following serial HD for volume removal over last 3 days. Well under her dry weight, needs to be lowered.  Post weight 49.5kg. 4.  End-stage renal disease: Usually on a Monday/Wednesday/Friday schedule. Informed HD clotted off on 6/7. Pt with recent hx of GIB. Plan to use tight heparin with HD to prevent ongoing clotting. Serial HD last 3 days d/t pulmonary edema.  Next HD 6/14.  5.  Secondary hyperparathyroidism: Phos at goal. Hold Sevelamer  for now. Continue calcitriol and Sensipar TIW 6.  Hypertension: Blood pressure now in goal.  On Metoprolol 25mg  and Hydralazine 50mg  qd. 7. Anemia - Hgb drop 6.6 yesterday, 1 unit pRBC given with HD. Improved to 9.4 today.  Iron studies: Iron 118, Tsat 88%, and Ferritin 1398. Fe is not indicated. Aranesp given 6/10.  8. Dispo: inpatient, anticipate SNF placement. Palliative care consulted.   Virgina Norfolk, PA-C [facility] 12/22/2022,1:17 PM  LOS: 8 days

## 2022-12-22 NOTE — Progress Notes (Signed)
Physical Therapy Treatment Patient Details Name: Shawna [name] MRN: [id number] DOB: Feb 29, 1941 Today's Date: 12/22/2022   History of Present Illness 82 yo female presenting 12/14/22 from [facility] due to CP after HD. Admitted with atrial tachycardia. Recently admitted 11/30/22-12/13/22 for rectal bleed. PMH ESRD on HD MWF, seizure disorder, afib, HTN, HLD, CAD s/p CABG and PCI, hypothyroidism, , Small bowel AVMS, GIB, Hx of colon CA, TIA, PUD, Gout.    PT Comments    Pt with some small progress compared to last visit. Remains confused but not as badly as last visit. Still leans posteriorly against bed in standing. Patient will benefit from continued inpatient follow up therapy, <3 hours/day    Recommendations for follow up therapy are one component of a multi-disciplinary discharge planning process, led by the attending physician.  Recommendations may be updated based on patient status, additional functional criteria and insurance authorization.  Follow Up Recommendations  Can patient physically be transported by private vehicle: No    Assistance Recommended at Discharge Frequent or constant Supervision/Assistance  Patient can return home with the following Assistance with cooking/housework;Help with stairs or ramp for entrance;Assist for transportation;Direct supervision/assist for medications management;Direct supervision/assist for financial management;A lot of help with walking and/or transfers;A lot of help with bathing/dressing/bathroom   Equipment Recommendations  None recommended by PT    Recommendations for Other Services       Precautions / Restrictions Precautions Precautions: Fall Restrictions Weight Bearing Restrictions: No     Mobility  Bed Mobility Overal bed mobility: Needs Assistance Bed Mobility: Supine to Sit, Sit to Supine     Supine to sit: Mod assist, HOB elevated Sit to supine: Mod assist   General bed mobility comments: Assist to elevate trunk and  bring hips to EOB. Assist to bring legs back up into bed    Transfers Overall transfer level: Needs assistance Equipment used: Rolling walker (2 wheels), Rollator (4 wheels) Transfers: Sit to/from Stand Sit to Stand: Mod assist           General transfer comment: Assist to power up. Pt leaning posteriorly on bed with posterior legs braced against bed. Incr time and assist to stand more erect without legs braced on bed.    Ambulation/Gait             Pre-gait activities: Side stepped up side of bed with +2 min assist with posterior lean against bed     Stairs             Wheelchair Mobility    Modified Rankin (Stroke Patients Only)       Balance Overall balance assessment: Needs assistance Sitting-balance support: Feet supported Sitting balance-Leahy Scale: Fair Sitting balance - Comments: Supervision sitting EOB statically   Standing balance support: Bilateral upper extremity supported, During functional activity, Reliant on assistive device for balance Standing balance-Leahy Scale: Poor Standing balance comment: walker and mod assist for static standing                            Cognition Arousal/Alertness: Awake/alert Behavior During Therapy: WFL for tasks assessed/performed Overall Cognitive Status: No family/caregiver present to determine baseline cognitive functioning Area of Impairment: Orientation, Attention, Memory, Following commands, Safety/judgement, Awareness, Problem solving                 Orientation Level: Disoriented to, Place, Time, Situation Current Attention Level: Sustained Memory: Decreased recall of precautions, Decreased short-term memory Following Commands: Follows one step  commands with increased time Safety/Judgement: Decreased awareness of safety, Decreased awareness of deficits Awareness: Intellectual Problem Solving: Slow processing, Decreased initiation, Difficulty sequencing, Requires verbal cues,  Requires tactile cues General Comments: Pt with baseline cognitive deficits        Exercises      General Comments        Pertinent Vitals/Pain Pain Assessment Pain Assessment: No/denies pain    Home Living                          Prior Function            PT Goals (current goals can now be found in the care plan section) Progress towards PT goals: Not progressing toward goals - comment    Frequency    Min 1X/week      PT Plan Current plan remains appropriate    Co-evaluation              AM-PAC PT "6 Clicks" Mobility   Outcome Measure  Help needed turning from your back to your side while in a flat bed without using bedrails?: A Lot Help needed moving from lying on your back to sitting on the side of a flat bed without using bedrails?: A Lot Help needed moving to and from a bed to a chair (including a wheelchair)?: Total Help needed standing up from a chair using your arms (e.g., wheelchair or bedside chair)?: A Lot Help needed to walk in hospital room?: Total Help needed climbing 3-5 steps with a railing? : Total 6 Click Score: 9    End of Session Equipment Utilized During Treatment: Gait belt Activity Tolerance: Patient limited by fatigue Patient left: in bed;with call bell/phone within reach;with bed alarm set   PT Visit Diagnosis: Other abnormalities of gait and mobility (R26.89);Muscle weakness (generalized) (M62.81);Difficulty in walking, not elsewhere classified (R26.2);Unsteadiness on feet (R26.81)     Time: 1610-9604 PT Time Calculation (min) (ACUTE ONLY): 18 min  Charges:  $Therapeutic Activity: 8-22 mins                     [facility] PT Acute Rehabilitation Services Office [phone]    Angelina Ok [facility] 12/22/2022, 3:39 PM

## 2022-12-22 NOTE — Progress Notes (Signed)
PROGRESS NOTE Shawna [name]  ZOX:[id number] DOB: 1941-02-16 DOA: 12/14/2022 PCP: [facility]  Brief Narrative/Hospital Course: 82 year old with past medical history significant for hypertension, hyperlipidemia, CAD status post CABG and PCI, paroxysmal A-fib, no longer on anticoagulation due to recurrent GI bleed, ESRD on hemodialysis (MWF), seizure disorder, hypothyroidism, history of pulmonary. Presented to the ED complaining of chest pain while she was returning from dialysis the day of admission. Reports chest pain sharp in nature, 10 out of 10 associated with palpitation. Patient admission to [facility]/due to AVM, received blood transfusion and was treated with argon plasma hospitalization. Developed paroxysmal Atrial flutter which was difficult to control she was placed on amiodarone and Coreg.  Seen by cardiology now on metoprolol, amiodarone.  Patient not on anticoagulation due to prior history of GI bleeding. ? Of Wide complex tachycardia, axis no change this is related to her left bundle branch block suspicious for atrial tachycardia versus atrial flutter- seen by cardiology. Patient underwent dialysis 6/10.  Seen by cardiology and signed off with planned outpatient follow-up in 2 weeks with Dr. Theora Gianotti nephrology no further needs and patient to be discharged to skilled nursing facility    Subjective: Patient seen and examined this morning Nursing reports that earlier she was very alert awake fed herself On my exam she was tired fatigued sleepy Patient had fever up to one 101.1 at 3 am No leukocytosis noted> ordered chest x-ray COVID/flu,blood culture   Assessment and Plan: Principal Problem:   Paroxysmal atrial flutter (HCC) Active Problems:   Macrocytic anemia   End-stage renal disease on hemodialysis (HCC)   Essential hypertension   Diabetes mellitus type 2 in nonobese (HCC)   Acquired hypothyroidism   Mixed hyperlipidemia   Wide-complex tachycardia  Episode of  fever up to 101.1: Obtained chest x-ray COVID screening blood culture> respiratory virus panel positive for metapneumovirus, continue supportive care, follow-up blood culture data .  Chest x-ray small left pleural effusion with improved left lung base ventilation  Atrial tachycardia/paroxysmal atrial fibrillation/flutter/?WCT: axis had no changes so per cardio this is related to her left bundle branch block likely from atrial tachycardia versus atrial flutter that showed as WCT. Cont rhythm control strategy with amiodarone 200 twice daily x 7 days then 200 mg daily, metoprolol 25 twice daily.  She is not on anticoagulation due to history of GI bleed.   Chronic systolic CHF with EF 30-35% CAD status post CABG with residual small vessel disease Chest pain due to demand ischemia: Seen by cardiology,known history of CABG vein grafts are patent however has residual small vessel disease that is being managed medically suspect her angina was related to tachycardia.Continue BP control strategy, statin. Not on aspirin due to history of GI bleeding.  Having fluid overload being managed with HD  Volume overload with shortness of breath: ESRD on HD MWF: nephrology following for HD.Underwent extra dialysis 6/11 given findings on chest x-ray with congestion and patient having shortness of breath> chest x-ray with improved aeration 6/13 continue on dialysis cont mwf  Anemia of chronic renal disease: hb dropped to 6.6 g transfused 1 unit PRBC with HD 6/12> Previous transfusion was on 5/22.  She has history of GI bleed but denies dark stool or any bleeding.  ESA per nephrology Repeat H&H improved to 9.4 g.  Monitor Recent Labs  Lab 12/18/22 0138 12/20/22 0858 12/21/22 0034 12/22/22 0010  HGB 7.5* 8.6* 6.6* 9.4*  HCT 23.3* 26.9* 20.0* 28.8*    Acute metabolic encephalopathy Lethargy-confusion weakness  deconditioning: suspect acute metabolic encephalopathy in the setting of multiple comorbidities, and  maternal risk.  Bit sleepy for me but earlier was much more alert awake and was fed herself, monitor close  Secondary hyperparathyroidism Phos at goal holding sevelamer for now. Continue calcitriol and Sensipar 3x weekly per nephrology    Hypertension: BP fluctuating hypotensive during HD- hypertensive otherwise.Continue hydralazine, metoprolol - cont per nehrology  Hyperlipidemia:Continue statin Type diabetes mellitus diet controlled Acquired hypothyroidism on levothyroxine.  GOC: She is weak and frail, high risk for readmission, decompensation has chronic systolic CHF with CAD currently on dialysis dependent. I called and updated patient's daughter 6/11.  Palliative care was consulted they have discussed with patient and family at this time remains full code.  DVT prophylaxis: SCDs Start: 12/14/22 2155 Code Status:   Code Status: Full Code Family Communication: plan of care discussed with patient at bedside. Patient status is: Inpatient because of shortness of breath, arteriosclerosis of PRN Level of care: Telemetry Medical   Dispo: The patient is from: home with husband, but currently in SNF since last week            Anticipated disposition: SNF 1-2 days  Objective: Vitals last 24 hrs: Vitals:   12/22/22 0453 12/22/22 0603 12/22/22 0730 12/22/22 1112  BP:   101/69 (!) 105/40  Pulse:   67 60  Resp: (!) 22 (!) 22 (!) 21 19  Temp: (!) 100.6 F (38.1 C) 99.9 F (37.7 C) 100.3 F (37.9 C) 98.9 F (37.2 C)  TempSrc: Oral Oral Oral Oral  SpO2:   99% 100%  Weight:      Height:       Weight change: -3.7 kg  Physical Examination: General exam: Weak lethargic, older appearing HEENT:Oral mucosa moist, Ear/Nose WNL grossly, dentition normal. Respiratory system: bilaterally diminished BS, no use of accessory muscle Cardiovascular system: S1 & S2 +, regular rate, JVD neg. Gastrointestinal system: Abdomen soft, NT,ND,BS+ Nervous System: Lethargic, moving extremities and grossly  nonfocal Extremities: LE ankle edema neg, lower extremities warm Skin: No rashes,no icterus. MSK: Normal muscle bulk,tone, power   Medications reviewed:  Scheduled Meds:  amiodarone  200 mg Oral BID   [START ON 12/23/2022] amiodarone  200 mg Oral Daily   atorvastatin  10 mg Oral QHS   calcitRIOL  1.75 mcg Oral Q M,W,F-HD   Chlorhexidine Gluconate Cloth  6 each Topical Q0600   [START ON 12/23/2022] Chlorhexidine Gluconate Cloth  6 each Topical Q0600   cinacalcet  30 mg Oral Q M,W,F-HD   darbepoetin (ARANESP) injection - DIALYSIS  150 mcg Subcutaneous Q Mon-1800   gabapentin  100 mg Oral QPM   hydrALAZINE  50 mg Oral Q8H   levETIRAcetam  500 mg Oral Daily   levothyroxine  75 mcg Oral QAC breakfast   metoprolol tartrate  25 mg Oral BID  Continuous Infusions:   Diet Order             Diet heart healthy/carb modified Room service appropriate? Yes; Fluid consistency: Thin  Diet effective now                  No intake or output data in the 24 hours ending 12/22/22 1330 Net IO Since Admission: -7,044.1 mL [12/22/22 1330]  Wt Readings from Last 3 Encounters:  12/22/22 49.5 kg  12/12/22 56.9 kg  11/18/22 59.4 kg     Unresulted Labs (From admission, onward)     Start     Ordered   12/22/22 [phone]  Culture, blood (Routine X 2) w Reflex to ID Panel  BLOOD CULTURE X 2,   R (with TIMED occurrences)      12/22/22 0833   Signed and Held  Renal function panel  Once,   R       Question:  Specimen collection method  Answer:  Lab=Lab collect   Signed and Held   Signed and Held  CBC  Once,   R       Question:  Specimen collection method  Answer:  Lab=Lab collect   Signed and Held          Data Reviewed: I have personally reviewed following labs and imaging studies CBC: Recent Labs  Lab 12/18/22 0138 12/20/22 0858 12/21/22 0034 12/22/22 0010  WBC 5.6 10.3 8.9 6.8  HGB 7.5* 8.6* 6.6* 9.4*  HCT 23.3* 26.9* 20.0* 28.8*  MCV 110.4* 111.6* 111.1* 99.7  PLT 236 235 231 197    Basic Metabolic Panel: Recent Labs  Lab 12/18/22 0138 12/20/22 0858 12/21/22 0034 12/22/22 0010  NA 131* 132* 131* 133*  K 3.9 4.4 4.4 3.9  CL 91* 94* 94* 94*  CO2 27 25 21* 26  GLUCOSE 100* 90 85 93  BUN 18 15 25* 14  CREATININE 5.31* 5.08* 6.10* 4.15*  CALCIUM 8.5* 8.4* 8.1* 8.0*  PHOS 3.2  --   --   --   GFR: Estimated Creatinine Clearance: 8.2 mL/min (A) (by C-G formula based on SCr of 4.15 mg/dL (H)). Liver Function Tests: No results for input(s): "AST", "ALT", "ALKPHOS", "BILITOT", "PROT", "ALBUMIN" in the last 168 hours.  Recent Results (from the past 240 hour(s))  Respiratory (~20 pathogens) panel by PCR     Status: Abnormal   Collection Time: 12/22/22  8:32 AM   Specimen: Nasopharyngeal Swab; Respiratory  Result Value Ref Range Status   Adenovirus NOT DETECTED NOT DETECTED Final   Coronavirus 229E NOT DETECTED NOT DETECTED Final    Comment: (NOTE) The Coronavirus on the Respiratory Panel, DOES NOT test for the novel  Coronavirus (2019 nCoV)    Coronavirus HKU1 NOT DETECTED NOT DETECTED Final   Coronavirus NL63 NOT DETECTED NOT DETECTED Final   Coronavirus OC43 NOT DETECTED NOT DETECTED Final   Metapneumovirus DETECTED (A) NOT DETECTED Final   Rhinovirus / Enterovirus NOT DETECTED NOT DETECTED Final   Influenza A NOT DETECTED NOT DETECTED Final   Influenza B NOT DETECTED NOT DETECTED Final   Parainfluenza Virus 1 NOT DETECTED NOT DETECTED Final   Parainfluenza Virus 2 NOT DETECTED NOT DETECTED Final   Parainfluenza Virus 3 NOT DETECTED NOT DETECTED Final   Parainfluenza Virus 4 NOT DETECTED NOT DETECTED Final   Respiratory Syncytial Virus NOT DETECTED NOT DETECTED Final   Bordetella pertussis NOT DETECTED NOT DETECTED Final   Bordetella Parapertussis NOT DETECTED NOT DETECTED Final   Chlamydophila pneumoniae NOT DETECTED NOT DETECTED Final   Mycoplasma pneumoniae NOT DETECTED NOT DETECTED Final    Comment: Performed at [facility], [name] [place], [place] [address]  SARS Coronavirus 2 by RT PCR (hospital order, performed in [facility] hospital lab) *cepheid single result test* Anterior Nasal Swab     Status: None   Collection Time: 12/22/22  8:33 AM   Specimen: Anterior Nasal Swab  Result Value Ref Range Status   SARS Coronavirus 2 by RT PCR NEGATIVE NEGATIVE Final    Comment: Performed at [facility], [address], [place], [place] [address]    Antimicrobials:  Anti-infectives (From admission, onward)    None      Culture/Microbiology    Component Value Date/Time   SDES BLOOD LEFT HAND 12/04/2022 2013   SPECREQUEST  12/04/2022 2013    BOTTLES DRAWN AEROBIC AND ANAEROBIC Blood Culture adequate volume   CULT  12/04/2022 2013    NO GROWTH 5 DAYS Performed at [facility], [address], [place], [place] [address]    REPTSTATUS 12/09/2022 FINAL 12/04/2022 2013   Radiology Studies: [facility] Chest Port 1 View  Result Date: 12/22/2022 CLINICAL DATA:  82 year old female with fever. EXAM: PORTABLE CHEST 1 VIEW COMPARISON:  Portable chest 12/20/2022 and earlier. FINDINGS: Portable AP semi upright view at 0859 hours. Prior CABG. Stable cardiac size and mediastinal contours. Extensive Calcified aortic atherosclerosis. left chest dual lumen dialysis type catheter. Left axillary vascular stent. Small left pleural effusion, left lung base ventilation appears mildly improved. No pneumothorax or pulmonary edema. No new pulmonary opacity. Stable visualized osseous structures. Negative visible bowel gas. IMPRESSION: 1. Small left pleural effusion with improved left lung base ventilation since 12/20/2022. 2. No new cardiopulmonary abnormality. 3.  Aortic Atherosclerosis (ICD10-I70.0). Electronically Signed   By: Odessa Fleming M.D.   On: 12/22/2022 09:36     LOS: 8 days   Lanae Boast, MD [facility]  12/22/2022, 1:30 PM

## 2022-12-22 NOTE — Progress Notes (Signed)
Contacted [employer] and spoke to [place]. Update provided to clinic. Will assist as needed.   Olivia Canter Renal Navigator [phone]

## 2022-12-22 NOTE — Progress Notes (Deleted)
Cardiology Office Note:  .   Date:  12/22/2022  ID:  Shawna [name], DOB 09-02-1940, MRN [id number] PCP: [facility]  [facility] Providers Cardiologist:  Dina Rich, MD { Click to update primary MD,subspecialty MD or APP then REFRESH:1}   History of Present Illness: .   Shawna [name] is a 82 y.o. female with a hx of  CAD (s/p CABG in 2013, DES to D1 in 10/2016, cath in 07/2018 and 07/2021 managed medically), HTN, HLD, PAF/flutter (not on anticoagulation given history of GIB), chronic anemia, NSVT (treated with amiodarone), ESRD on HD, chronic HFrEF, mitral regurgitation and stenosis/tricuspid regurgitation, LBBB, colon CA, esophageal stricture, carotid artery disease (<40% by duplex 2016), PUD, TIA, seizures, hypothyroidism, recurrent GI bleeding due to small bowel AVM,    She was seen 12/15/2022 for the evaluation of wide-complex tachycardia felt to be atrial tachycardia with LBBB  ROS: ***  Studies Reviewed: Marland Kitchen    EKG:  ***  Left heart catheterization 07/22/2021   Mid LM to Dist LM lesion is 60% stenosed.   Prox LAD lesion is 90% stenosed.   Ost Cx to Mid Cx lesion is 99% stenosed.   Ost RCA to Prox RCA lesion is 100% stenosed.   Ost Ramus to Ramus lesion is 80% stenosed.   Ost 1st Diag to 1st Diag lesion is 100% stenosed.   Ost RPDA to RPDA lesion is 75% stenosed.   2nd Mrg lesion is 60% stenosed.   Dist LAD lesion is 70% stenosed.   LIMA and is large.   SVG and is large.   SVG and is normal in caliber.   SVG and is large.   The graft exhibits no disease.   The graft exhibits no disease.   The graft exhibits no disease.   LV end diastolic pressure is mildly elevated.   The left ventricular ejection fraction is 35-45% by visual estimate.   Echocardiogram 07/16/2021 1. Left ventricular ejection fraction, by estimation, is 30 to 35% . The left ventricle has moderately decreased function. The left ventricle demonstrates regional wall motion abnormalities (  see scoring diagram/ findings for description) . There is mild left ventricular hypertrophy. Left ventricular diastolic parameters are consistent with Grade II diastolic dysfunction ( pseudonormalization) . Elevated left atrial pressure. 2. Right ventricular systolic function is moderately reduced. The right ventricular size is normal. There is mildly elevated pulmonary artery systolic pressure. The estimated right ventricular systolic pressure is 43. 4 mmHg. 3. Left atrial size was severely dilated. 4. Right atrial size was mildly dilated. 5. The mitral valve is degenerative. Moderate mitral valve regurgitation. Mild to moderate mitral stenosis. MG at 70bpm, MVA 1. 2 cm^ 2 by continuity equation. Moderate mitral annular calcification. 6. Tricuspid valve regurgitation is mild to moderate. 7. The aortic valve is tricuspid. Aortic valve regurgitation is not visualized. Aortic valve sclerosis/ calcification is present, without any evidence of aortic stenosis. 8. The inferior vena cava is normal in size with greater than 50% respiratory variability, suggesting right atrial pressure of 3 mmHg. Risk Assessment/Calculations:   {Does this patient have ATRIAL FIBRILLATION?:[phone]}         Physical Exam:   VS:  There were no vitals taken for this visit.   Wt Readings from Last 3 Encounters:  12/22/22 109 lb 2 oz (49.5 kg)  12/12/22 125 lb 7.1 oz (56.9 kg)  11/18/22 130 lb 15.3 oz (59.4 kg)    GEN: Well nourished, well developed in no acute distress  NECK: No JVD; No carotid bruits CARDIAC: ***RRR, no murmurs, rubs, gallops RESPIRATORY:  Clear to auscultation without rales, wheezing or rhonchi  ABDOMEN: Soft, non-tender, non-distended EXTREMITIES:  No edema; No deformity   ASSESSMENT AND PLAN: .   Atrial tachycardia: The patient presents and is found to have episodes of atrial tachycardia.  She has a left bundle branch block at baseline so I would not necessarily call this a new wide complex  tachycardia.        She was on carvedilol at home and the beta-blocker has been held.  Will start her on metoprolol 25 mg twice a day.  Continue amiodarone for now.    History of coronary artery disease: She denies having any episodes of angina.    History of atrial fibrillation/atrial flutter: She is not on anticoagulation due to long history of complex GI bleeds.  She apparently has bleeding AVMs diagnosed on multiple occasions.   Left bundle branch block: Chronic          {Are you ordering a CV Procedure (e.g. stress test, cath, DCCV, TEE, etc)?   Press F2        :[id number]}  Dispo: ***  Signed, Jacolyn Reedy, PA-C

## 2022-12-23 DIAGNOSIS — I4892 Unspecified atrial flutter: Secondary | ICD-10-CM | POA: Diagnosis not present

## 2022-12-23 LAB — RENAL FUNCTION PANEL
Albumin: 2.3 g/dL — ABNORMAL LOW (ref 3.5–5.0)
Anion gap: 14 (ref 5–15)
BUN: 33 mg/dL — ABNORMAL HIGH (ref 8–23)
CO2: 23 mmol/L (ref 22–32)
Calcium: 7.9 mg/dL — ABNORMAL LOW (ref 8.9–10.3)
Chloride: 94 mmol/L — ABNORMAL LOW (ref 98–111)
Creatinine, Ser: 7.35 mg/dL — ABNORMAL HIGH (ref 0.44–1.00)
GFR, Estimated: 5 mL/min — ABNORMAL LOW (ref >60)
Glucose, Bld: 80 mg/dL (ref 70–99)
Phosphorus: 4.4 mg/dL (ref 2.5–4.6)
Potassium: 4.3 mmol/L (ref 3.5–5.1)
Sodium: 131 mmol/L — ABNORMAL LOW (ref 135–145)

## 2022-12-23 MED ORDER — HEPARIN SODIUM (PORCINE) 1000 UNIT/ML IJ SOLN
INTRAMUSCULAR | Status: AC
  Administered 2022-12-23: 3800 [IU]
  Filled 2022-12-23: qty 5

## 2022-12-23 MED ORDER — HEPARIN SODIUM (PORCINE) 1000 UNIT/ML IJ SOLN
INTRAMUSCULAR | Status: AC
  Filled 2022-12-23: qty 1

## 2022-12-23 NOTE — Progress Notes (Signed)
   12/23/22 1731  Vitals  Temp 98.4 F (36.9 C)  Pulse Rate 67  Resp 17  BP 133/69  SpO2 100 %  O2 Device Nasal Cannula  Weight 48.2 kg  Type of Weight Post-Dialysis  Oxygen Therapy  O2 Flow Rate (L/min) 2 L/min  Patient Activity (if Appropriate) In bed  Pulse Oximetry Type Continuous  Oximetry Probe Site Changed No   Received patient in bed to unit.  Alert and oriented.  Informed consent signed and in chart.   TX duration:3.5  Patient tolerated well.  Transported back to the room  Alert, without acute distress.  Hand-off given to patient's nurse.   Access used: LDLC Access issues: no complications  Total UF removed: 1400 Medication(s) given: none    [facility]

## 2022-12-23 NOTE — Progress Notes (Signed)
[facility] Progress Note   Subjective:  Seen in room - says she is tired, but otherwise ok. Says breathing is a little better. Febrile again overnight. For HD today.  Objective Vitals:   12/23/22 0002 12/23/22 0437 12/23/22 0547 12/23/22 0832  BP: (!) 124/43 (!) 135/48  (!) 118/36  Pulse: 61 64  61  Resp: 20 20 (!) 23 20  Temp: 99.6 F (37.6 C) (!) 101.8 F (38.8 C)  98.7 F (37.1 C)  TempSrc: Oral Oral  Oral  SpO2: 100% 100%  100%  Weight:  51.9 kg    Height:       Physical Exam General: Ill appearing woman, NAD. Nasal O2 in place. Heart: RRR; no murmur Lungs: CTA anteriorly Abdomen: soft Extremities: no LE edema Dialysis Access: [facility]  Additional Objective Labs: Basic Metabolic Panel: Recent Labs  Lab 12/18/22 0138 12/20/22 0858 12/21/22 0034 12/22/22 0010  NA 131* 132* 131* 133*  K 3.9 4.4 4.4 3.9  CL 91* 94* 94* 94*  CO2 27 25 21* 26  GLUCOSE 100* 90 85 93  BUN 18 15 25* 14  CREATININE 5.31* 5.08* 6.10* 4.15*  CALCIUM 8.5* 8.4* 8.1* 8.0*  PHOS 3.2  --   --   --    CBC: Recent Labs  Lab 12/18/22 0138 12/20/22 0858 12/21/22 0034 12/22/22 0010  WBC 5.6 10.3 8.9 6.8  HGB 7.5* 8.6* 6.6* 9.4*  HCT 23.3* 26.9* 20.0* 28.8*  MCV 110.4* 111.6* 111.1* 99.7  PLT 236 235 231 197   Blood Culture    Component Value Date/Time   SDES BLOOD RIGHT HAND 12/22/2022 1758   SDES BLOOD RIGHT HAND 12/22/2022 1758   SPECREQUEST  12/22/2022 1758    BOTTLES DRAWN AEROBIC AND ANAEROBIC Blood Culture adequate volume   SPECREQUEST  12/22/2022 1758    BOTTLES DRAWN AEROBIC AND ANAEROBIC Blood Culture adequate volume   CULT  12/22/2022 1758    NO GROWTH < 24 HOURS Performed at [facility], [address], [place], [place] [address]    CULT  12/22/2022 1758    NO GROWTH < 24 HOURS Performed at [facility], [address], [place], [place] [address]    REPTSTATUS PENDING 12/22/2022 1758   REPTSTATUS PENDING 12/22/2022 1758   Studies/Results: DG Chest Port 1 View  Result Date: 12/22/2022 CLINICAL DATA:  82 year old female with fever. EXAM: PORTABLE CHEST 1 VIEW COMPARISON:  Portable chest 12/20/2022 and earlier. FINDINGS: Portable AP semi upright view at 0859 hours. Prior CABG. Stable cardiac size and mediastinal contours. Extensive Calcified aortic atherosclerosis. left chest dual lumen dialysis type catheter. Left axillary vascular stent. Small left pleural effusion, left lung base ventilation appears mildly improved. No pneumothorax or pulmonary edema. No new pulmonary opacity. Stable visualized osseous structures. Negative visible bowel gas. IMPRESSION: 1. Small left pleural effusion with improved left lung base ventilation since 12/20/2022. 2. No new cardiopulmonary abnormality. 3.  Aortic Atherosclerosis (ICD10-I70.0). Electronically Signed   By: Odessa Fleming M.D.   On: 12/22/2022 09:36    Medications:   amiodarone  200 mg Oral Daily   atorvastatin  10 mg Oral QHS   calcitRIOL  1.75 mcg Oral Q M,W,F-HD   Chlorhexidine Gluconate Cloth  6 each Topical Q0600   Chlorhexidine Gluconate Cloth  6 each Topical Q0600   cinacalcet  30 mg Oral Q M,W,F-HD   darbepoetin (ARANESP) injection - DIALYSIS  150 mcg Subcutaneous Q Mon-1800   gabapentin  100 mg Oral QPM  hydrALAZINE  50 mg Oral Q8H   levETIRAcetam  500 mg Oral Daily   levothyroxine  75 mcg Oral QAC breakfast   metoprolol tartrate  25 mg Oral BID    Dialysis Orders: MWF at [facility] EDW 58.5kg TDC 2K 2.5Ca BFR 300 DFR 500 Calcitrol 1.46mcg PO q HD Sensipar 30mg  PO q HD Mircera 200 mcg IV q 2 weeks- Last dose 11/28/22   Assessment/Plan: 1.  Paroxysmal Afib: Cardiology initially involved, on amiodarone + metoprolol, no AC due to Hx GIB.   2.  Metapneumovirus: Continues to intermittently spike fevers. Management per PMD.  3. Shortness of breath: Improved s/p serial HD for volume removal. Now well under her prior dry weight, needs to be lowered on  discharge. 4.  ESRD: Continue HD on MWF schedule. Clots HD circuit without small amount of heparin. For HD today. 5.  Secondary hyperparathyroidism: CorrCa and Phos to goal. Renvela on hold. Continue calcitriol and Sensipar q HD for now. 6.  Hypertension: BP stable. Remains on metoprolol 25mg  BID and hydralazine 50mg  TID. 7. Anemia of ESRD: Hgb down to 6.6 on 6/12, s/p 1U PRBCs. Hgb 9.4. Last tsat 88% - no iron needed.  Aranesp given q Monday here (last 6/10). 8. Dispo: Anticipating SNF placement. Palliative care consulted, still full scope for now.   Ozzie Hoyle, PA-C 12/23/2022, 10:50 AM  [employer]

## 2022-12-23 NOTE — Progress Notes (Signed)
PROGRESS NOTE KY HARPOLE  ZOX:[id number] DOB: [birth year] DOA: 12/14/2022 PCP: [facility]  Brief Narrative/Hospital Course: 82 year old with past medical history significant for hypertension, hyperlipidemia, CAD status post CABG and PCI, paroxysmal A-fib, no longer on anticoagulation due to recurrent GI bleed, ESRD on hemodialysis (MWF), seizure disorder, hypothyroidism, history of pulmonary. Presented to the ED complaining of chest pain while she was returning from dialysis the day of admission. Reports chest pain sharp in nature, 10 out of 10 associated with palpitation. Patient admission to [facility]/due to AVM, received blood transfusion and was treated with argon plasma hospitalization. Developed paroxysmal Atrial flutter which was difficult to control she was placed on amiodarone and Coreg.  Seen by cardiology now on metoprolol, amiodarone.  Patient not on anticoagulation due to prior history of GI bleeding. ? Of Wide complex tachycardia, axis no change this is related to her left bundle branch block suspicious for atrial tachycardia versus atrial flutter- seen by cardiology. Patient underwent dialysis 6/10.  Seen by cardiology and signed off with planned outpatient follow-up in 2 weeks with Dr. Theora Gianotti nephrology no further needs and patient to be discharged to skilled nursing facility    Subjective:  Seen and examined Feels tired and has been lethargic Again patient is febrile this morning on 101.8, mildly tachypneic 23 BP stable Confused   Assessment and Plan: Principal Problem:   Paroxysmal atrial flutter (HCC) Active Problems:   Macrocytic anemia   End-stage renal disease on hemodialysis (HCC)   Essential hypertension   Diabetes mellitus type 2 in nonobese [facility] [address])   Acquired hypothyroidism   Mixed hyperlipidemia   Wide-complex tachycardia  Episode of fever 6/13 w/ metapneumovirus infection: Patient had fever 6/13 workup shows metapneumovirus infection COVID  screen negative, blood culture has been sent follow-up the resultsChest x-ray small left pleural effusion with improved left lung base ventilation., continue symptomatic management supportive care.  Still remains febrile this morning  Atrial tachycardia/paroxysmal atrial fibrillation/flutter/?WCT: axis had no changes so per cardio this is related to her left bundle branch block likely from atrial tachycardia versus atrial flutter that showed as WCT. Cont rhythm control strategy with amiodarone 200 twice daily x 7 days then 200 mg daily, metoprolol 25 twice daily.  She is not on anticoagulation due to history of GI bleed.   Chronic systolic CHF with EF 30-35% CAD status post CABG with residual small vessel disease Chest pain due to demand ischemia: Seen by cardiology,known history of CABG vein grafts are patent however has residual small vessel disease that is being managed medically suspect her angina was related to tachycardia. Not on aspirin due to history of GI bleeding continue to address fluids with dialysis, continue to optimize antihypertensive.    Acute pulmonary edema/volume overload ESRD on HD MWF: nephrology following for HD.Underwent serial HD on 6/ 11, 6/12, 6/13 and for regular HD today> chest x-ray with improved aeration 6/13.  Dry weight has been lowered Cont HD per schedule  Anemia of chronic renal disease: hb dropped to 6.6 g s/p 1 unit PRBC with HD 6/12> hb improved.Previous transfusion was on 5/22.  She has history of GI bleed but denies dark stool or any bleeding.  ESA per nephrology Trend hh Recent Labs  Lab 12/18/22 0138 12/20/22 0858 12/21/22 0034 12/22/22 0010  HGB 7.5* 8.6* 6.6* 9.4*  HCT 23.3* 26.9* 20.0* 28.8*    Acute metabolic encephalopathy Lethargy-confusion weakness deconditioning: suspect acute metabolic encephalopathy in the setting of multiple comorbidities, and metapneumo virus.  Continue to  provide delirium precautions supportive care fall  precaution, PTOT.  Secondary hyperparathyroidism Phos at goal holding sevelamer for now. Continue calcitriol and Sensipar 3x weekly per nephrology    Hypertension: BP fluctuating hypotensive during HD- hypertensive otherwise.Continue hydralazine, metoprolol - cont per nehrology.  Hyperlipidemia:Continue statin Type diabetes mellitus diet controlled Acquired hypothyroidism on levothyroxine.  GOC: She is weak and frail, high risk for readmission, decompensation  with chronic systolic CHF with CAD currently on dialysis dependent now with meta-pneumoviral infection. I had called and updated patient's daughter 6/11.Palliative care has been consulted and following closely appreciate input.    DVT prophylaxis: SCDs Start: 12/14/22 2155 Code Status:   Code Status: Full Code Family Communication: plan of care discussed with patient at bedside. I called to updated her daughter 6/14, no answer- called her spouse [name] and updated- he is worried "they wouldn't pull it hard in dialysis". Patient status is: Inpatient because of shortness of breath, arteriosclerosis of PRN Level of care: Telemetry Medical   Dispo: The patient is from: home with husband, but currently in SNF since last week            Anticipated disposition: SNF 1-2 days once afebrile for 24 hours  Objective: Vitals last 24 hrs: Vitals:   12/23/22 0002 12/23/22 0437 12/23/22 0547 12/23/22 0832  BP: (!) 124/43 (!) 135/48  (!) 118/36  Pulse: 61 64  61  Resp: 20 20 (!) 23 20  Temp: 99.6 F (37.6 C) (!) 101.8 F (38.8 C)  98.7 F (37.1 C)  TempSrc: Oral Oral  Oral  SpO2: 100% 100%  100%  Weight:  51.9 kg    Height:       Weight change: 0 kg  Physical Examination: General exam: Weak lethargic weak,older appearing HEENT:Oral mucosa moist, Ear/Nose WNL grossly, dentition normal. Respiratory system: bilaterally clear BS, chest TDC+ W/ dressing intact no use of accessory muscle Cardiovascular system: S1 & S2 +, regular rate,  JVD neg. Gastrointestinal system: Abdomen soft, NT,ND,BS+ Nervous System:Alert, awake, moving extremities and grossly nonfocal Extremities: LE ankle edema NEG, lower extremities warm Skin:No rashes,no icterus. BMW:UXLKGM muscle bulk,tone,power.   Medications reviewed:  Scheduled Meds:  amiodarone  200 mg Oral Daily   atorvastatin  10 mg Oral QHS   calcitRIOL  1.75 mcg Oral Q M,W,F-HD   Chlorhexidine Gluconate Cloth  6 each Topical Q0600   Chlorhexidine Gluconate Cloth  6 each Topical Q0600   cinacalcet  30 mg Oral Q M,W,F-HD   darbepoetin (ARANESP) injection - DIALYSIS  150 mcg Subcutaneous Q Mon-1800   gabapentin  100 mg Oral QPM   hydrALAZINE  50 mg Oral Q8H   levETIRAcetam  500 mg Oral Daily   levothyroxine  75 mcg Oral QAC breakfast   metoprolol tartrate  25 mg Oral BID  Continuous Infusions:  Diet Order             Diet heart healthy/carb modified Room service appropriate? Yes; Fluid consistency: Thin  Diet effective now                  No intake or output data in the 24 hours ending 12/23/22 1010 Net IO Since Admission: -7,044.1 mL [12/23/22 1010]  Wt Readings from Last 3 Encounters:  12/23/22 51.9 kg  12/12/22 56.9 kg  11/18/22 59.4 kg     Unresulted Labs (From admission, onward)     Start     Ordered   12/24/22 0500  CBC  Tomorrow morning,   R  Question:  Specimen collection method  Answer:  Lab=Lab collect   12/23/22 1004   12/24/22 0500  Basic metabolic panel  Daily,   R     Question:  Specimen collection method  Answer:  Lab=Lab collect   12/23/22 1004   12/24/22 0500  CBC  Daily,   R     Question:  Specimen collection method  Answer:  Lab=Lab collect   12/23/22 1004   12/23/22 1005  Renal function panel  Once,   R       Question:  Specimen collection method  Answer:  Lab=Lab collect   12/23/22 1004   Signed and Held  Renal function panel  Once,   R       Question:  Specimen collection method  Answer:  Lab=Lab collect   Signed and Held    Signed and Held  CBC  Once,   R       Question:  Specimen collection method  Answer:  Lab=Lab collect   Signed and Held          Data Reviewed: I have personally reviewed following labs and imaging studies CBC: Recent Labs  Lab 12/18/22 0138 12/20/22 0858 12/21/22 0034 12/22/22 0010  WBC 5.6 10.3 8.9 6.8  HGB 7.5* 8.6* 6.6* 9.4*  HCT 23.3* 26.9* 20.0* 28.8*  MCV 110.4* 111.6* 111.1* 99.7  PLT 236 235 231 197  Basic Metabolic Panel: Recent Labs  Lab 12/18/22 0138 12/20/22 0858 12/21/22 0034 12/22/22 0010  NA 131* 132* 131* 133*  K 3.9 4.4 4.4 3.9  CL 91* 94* 94* 94*  CO2 27 25 21* 26  GLUCOSE 100* 90 85 93  BUN 18 15 25* 14  CREATININE 5.31* 5.08* 6.10* 4.15*  CALCIUM 8.5* 8.4* 8.1* 8.0*  PHOS 3.2  --   --   --   GFR: Estimated Creatinine Clearance: 8.3 mL/min (A) (by C-G formula based on SCr of 4.15 mg/dL (H)). Liver Function Tests: No results for input(s): "AST", "ALT", "ALKPHOS", "BILITOT", "PROT", "ALBUMIN" in the last 168 hours.  Recent Results (from the past 240 hour(s))  Respiratory (~20 pathogens) panel by PCR     Status: Abnormal   Collection Time: 12/22/22  8:32 AM   Specimen: Nasopharyngeal Swab; Respiratory  Result Value Ref Range Status   Adenovirus NOT DETECTED NOT DETECTED Final   Coronavirus 229E NOT DETECTED NOT DETECTED Final    Comment: (NOTE) The Coronavirus on the Respiratory Panel, DOES NOT test for the novel  Coronavirus (2019 nCoV)    Coronavirus HKU1 NOT DETECTED NOT DETECTED Final   Coronavirus NL63 NOT DETECTED NOT DETECTED Final   Coronavirus OC43 NOT DETECTED NOT DETECTED Final   Metapneumovirus DETECTED (A) NOT DETECTED Final   Rhinovirus / Enterovirus NOT DETECTED NOT DETECTED Final   Influenza A NOT DETECTED NOT DETECTED Final   Influenza B NOT DETECTED NOT DETECTED Final   Parainfluenza Virus 1 NOT DETECTED NOT DETECTED Final   Parainfluenza Virus 2 NOT DETECTED NOT DETECTED Final   Parainfluenza Virus 3 NOT DETECTED NOT  DETECTED Final   Parainfluenza Virus 4 NOT DETECTED NOT DETECTED Final   Respiratory Syncytial Virus NOT DETECTED NOT DETECTED Final   Bordetella pertussis NOT DETECTED NOT DETECTED Final   Bordetella Parapertussis NOT DETECTED NOT DETECTED Final   Chlamydophila pneumoniae NOT DETECTED NOT DETECTED Final   Mycoplasma pneumoniae NOT DETECTED NOT DETECTED Final    Comment: Performed at [facility], [address] [place], [place] [address]  SARS  Coronavirus 2 by RT PCR (hospital order, performed in [facility] hospital lab) *cepheid single result test* Anterior Nasal Swab     Status: None   Collection Time: 12/22/22  8:33 AM   Specimen: Anterior Nasal Swab  Result Value Ref Range Status   SARS Coronavirus 2 by RT PCR NEGATIVE NEGATIVE Final    Comment: Performed at [facility], [address], [place], [place] [address]  Culture, blood (Routine X 2) w Reflex to ID Panel     Status: None (Preliminary result)   Collection Time: 12/22/22  5:58 PM   Specimen: BLOOD RIGHT HAND  Result Value Ref Range Status   Specimen Description BLOOD RIGHT HAND  Final   Special Requests   Final    BOTTLES DRAWN AEROBIC AND ANAEROBIC Blood Culture adequate volume   Culture   Final    NO GROWTH < 24 HOURS Performed at [facility], [address], [place], [place] [address]    Report Status PENDING  Incomplete  Culture, blood (Routine X 2) w Reflex to ID Panel     Status: None (Preliminary result)   Collection Time: 12/22/22  5:58 PM   Specimen: BLOOD RIGHT HAND  Result Value Ref Range Status   Specimen Description BLOOD RIGHT HAND  Final   Special Requests   Final    BOTTLES DRAWN AEROBIC AND ANAEROBIC Blood Culture adequate volume   Culture   Final    NO GROWTH < 24 HOURS Performed at [facility], [address] [place], [place] [address]    Report Status PENDING  Incomplete  Antimicrobials: Anti-infectives (From admission, onward)    None     Culture/Microbiology     Component Value Date/Time   SDES BLOOD RIGHT HAND 12/22/2022 1758   SDES BLOOD RIGHT HAND 12/22/2022 1758   SPECREQUEST  12/22/2022 1758    BOTTLES DRAWN AEROBIC AND ANAEROBIC Blood Culture adequate volume   SPECREQUEST  12/22/2022 1758    BOTTLES DRAWN AEROBIC AND ANAEROBIC Blood Culture adequate volume   CULT  12/22/2022 1758    NO GROWTH < 24 HOURS Performed at [facility], [address], [place], [place] [address]    CULT  12/22/2022 1758    NO GROWTH < 24 HOURS Performed at [facility], [address] [place], [place] [address]    REPTSTATUS PENDING 12/22/2022 1758   REPTSTATUS PENDING 12/22/2022 1758  Radiology Studies: [facility] Chest Port 1 View  Result Date: 12/22/2022 CLINICAL DATA:  82 year old female with fever. EXAM: PORTABLE CHEST 1 VIEW COMPARISON:  Portable chest 12/20/2022 and earlier. FINDINGS: Portable AP semi upright view at 0859 hours. Prior CABG. Stable cardiac size and mediastinal contours. Extensive Calcified aortic atherosclerosis. left chest dual lumen dialysis type catheter. Left axillary vascular stent. Small left pleural effusion, left lung base ventilation appears mildly improved. No pneumothorax or pulmonary edema. No new pulmonary opacity. Stable visualized osseous structures. Negative visible bowel gas. IMPRESSION: 1. Small left pleural effusion with improved left lung base ventilation since 12/20/2022. 2. No new cardiopulmonary abnormality. 3.  Aortic Atherosclerosis (ICD10-I70.0). Electronically Signed   By: Odessa Fleming M.D.   On: 12/22/2022 09:36    LOS: 9 days  Lanae Boast, MD [facility]  12/23/2022, 10:10 AM

## 2022-12-23 NOTE — Progress Notes (Signed)
Occupational Therapy Treatment Patient Details Name: Shawna [name] MRN: [id number] DOB: April 20, 1941 Today's Date: 12/23/2022   History of present illness 82 yo female presenting 12/14/22 from [facility] due to CP after HD. Admitted with atrial tachycardia. Recently admitted 11/30/22-12/13/22 for rectal bleed. PMH ESRD on HD MWF, seizure disorder, afib, HTN, HLD, CAD s/p CABG and PCI, hypothyroidism, , Small bowel AVMS, GIB, Hx of colon CA, TIA, PUD, Gout.   OT comments  Patient with O2 off upon entering and sleeping soundly.  Patient with difficulty maintaining LOA even with sitting EOB.   OOB to recliner deferred, patient positioned in bed, and attempted self feeding as breakfast tray untouched.  Patient would open her eyes to command, but quickly fall back to sleep without stimulation.  OT will continue efforts to address deficits, and Patient will benefit from continued inpatient follow up therapy, <3 hours/day.    Recommendations for follow up therapy are one component of a multi-disciplinary discharge planning process, led by the attending physician.  Recommendations may be updated based on patient status, additional functional criteria and insurance authorization.    Assistance Recommended at Discharge Frequent or constant Supervision/Assistance  Patient can return home with the following  Assistance with cooking/housework;Direct supervision/assist for medications management;Direct supervision/assist for financial management;Assist for transportation;Help with stairs or ramp for entrance;A little help with walking and/or transfers;A little help with bathing/dressing/bathroom;A lot of help with bathing/dressing/bathroom   Equipment Recommendations  None recommended by OT    Recommendations for Other Services      Precautions / Restrictions Precautions Precautions: Fall Restrictions Weight Bearing Restrictions: No       Mobility Bed Mobility   Bed Mobility: Sidelying to Sit, Sit to  Sidelying   Sidelying to sit: Max assist     Sit to sidelying: Max assist   Patient Response: Cooperative  Transfers                   General transfer comment: deferred due to LOA     Balance Overall balance assessment: Needs assistance Sitting-balance support: Feet supported Sitting balance-Leahy Scale: Poor   Postural control: Posterior lean, Right lateral lean                                 ADL either performed or assessed with clinical judgement:        Max A for light grooming.                                          Extremity/Trunk Assessment Upper Extremity Assessment Upper Extremity Assessment: Generalized weakness   Lower Extremity Assessment Lower Extremity Assessment: Defer to PT evaluation   Cervical / Trunk Assessment Cervical / Trunk Assessment: Kyphotic                      Cognition Arousal/Alertness: Lethargic Behavior During Therapy: Flat affect Overall Cognitive Status: No family/caregiver present to determine baseline cognitive functioning                                                             Pertinent Vitals/ Pain  Pain Assessment Pain Assessment: Faces Faces Pain Scale: Hurts a little bit Pain Location: generalized with movement Pain Descriptors / Indicators: Grimacing Pain Intervention(s): Monitored during session                                                          Frequency  Min 2X/week        Progress Toward Goals  OT Goals(current goals can now be found in the care plan section)  Progress towards OT goals: Progressing toward goals  Acute Rehab OT Goals OT Goal Formulation: With patient Time For Goal Achievement: 12/31/22 Potential to Achieve Goals: Fair  Plan Discharge plan remains appropriate    Co-evaluation                 AM-PAC OT "6 Clicks" Daily Activity     Outcome Measure   Help  from another person eating meals?: A Little Help from another person taking care of personal grooming?: A Little Help from another person toileting, which includes using toliet, bedpan, or urinal?: A Lot Help from another person bathing (including washing, rinsing, drying)?: A Lot Help from another person to put on and taking off regular upper body clothing?: A Lot Help from another person to put on and taking off regular lower body clothing?: A Lot 6 Click Score: 14    End of Session Equipment Utilized During Treatment: Oxygen  OT Visit Diagnosis: Unsteadiness on feet (R26.81);Other abnormalities of gait and mobility (R26.89);Muscle weakness (generalized) (M62.81)   Activity Tolerance Patient limited by lethargy   Patient Left in bed;with call bell/phone within reach;with bed alarm set   Nurse Communication Mobility status        Time: 1113-1130 OT Time Calculation (min): 17 min  Charges: OT General Charges $OT Visit: 1 Visit OT Treatments $Self Care/Home Management : 8-22 mins  12/23/2022  RP, OTR/L  Acute Rehabilitation Services  Office:  [phone]   Suzanna Obey 12/23/2022, 11:51 AM

## 2022-12-24 DIAGNOSIS — I4892 Unspecified atrial flutter: Secondary | ICD-10-CM | POA: Diagnosis not present

## 2022-12-24 LAB — BASIC METABOLIC PANEL
Anion gap: 12 (ref 5–15)
BUN: 13 mg/dL (ref 8–23)
CO2: 26 mmol/L (ref 22–32)
Calcium: 8.3 mg/dL — ABNORMAL LOW (ref 8.9–10.3)
Chloride: 96 mmol/L — ABNORMAL LOW (ref 98–111)
Creatinine, Ser: 3.63 mg/dL — ABNORMAL HIGH (ref 0.44–1.00)
GFR, Estimated: 12 mL/min — ABNORMAL LOW (ref >60)
Glucose, Bld: 80 mg/dL (ref 70–99)
Potassium: 3.8 mmol/L (ref 3.5–5.1)
Sodium: 134 mmol/L — ABNORMAL LOW (ref 135–145)

## 2022-12-24 LAB — CBC
HCT: 29.5 % — ABNORMAL LOW (ref 36.0–46.0)
Hemoglobin: 9.5 g/dL — ABNORMAL LOW (ref 12.0–15.0)
MCH: 33.3 pg (ref 26.0–34.0)
MCHC: 32.2 g/dL (ref 30.0–36.0)
MCV: 103.5 fL — ABNORMAL HIGH (ref 80.0–100.0)
Platelets: 188 10*3/uL (ref 150–400)
RBC: 2.85 MIL/uL — ABNORMAL LOW (ref 3.87–5.11)
RDW: 24.7 % — ABNORMAL HIGH (ref 11.5–15.5)
WBC: 5.9 10*3/uL (ref 4.0–10.5)
nRBC: 0 % (ref 0.0–0.2)

## 2022-12-24 LAB — CULTURE, BLOOD (ROUTINE X 2): Culture: NO GROWTH

## 2022-12-24 MED ORDER — HYDRALAZINE HCL 25 MG PO TABS
25.0000 mg | ORAL_TABLET | Freq: Two times a day (BID) | ORAL | Status: DC
  Administered 2022-12-24: 25 mg via ORAL
  Filled 2022-12-24 (×4): qty 1

## 2022-12-24 NOTE — TOC Progression Note (Signed)
Transition of Care [facility]) - Progression Note    Patient Details  Name: Shawna [name] MRN: [id number] Date of Birth: Jun 21, 1941  Transition of Care [facility]) CM/SW Contact  Delilah Shan, LCSWA Phone Number: 12/24/2022, 10:46 AM  Clinical Narrative:     Patient has SNF bed at [facility]. CSW following to restart insurance authorization closer to patient being medically ready. Palliative following. CSW will continue to follow and assist with patients dc planning needs.       Expected Discharge Plan and Services         Expected Discharge Date: 12/19/22                                     Social Determinants of Health (SDOH) Interventions SDOH Screenings   Food Insecurity: No Food Insecurity (12/22/2022)  Housing: Low Risk  (12/22/2022)  Transportation Needs: No Transportation Needs (12/22/2022)  Utilities: Not At Risk (12/22/2022)  Financial Resource Strain: Low Risk  (12/14/2018)  Physical Activity: Unknown (12/14/2018)  Social Connections: Unknown (12/14/2018)  Stress: No Stress Concern Present (12/14/2018)  Tobacco Use: Low Risk  (12/14/2022)    Readmission Risk Interventions    10/31/2022   12:45 PM 08/29/2022    4:47 PM 11/12/2021    8:56 AM  Readmission Risk Prevention Plan  Transportation Screening Complete Complete Complete  Medication Review Oceanographer) Complete Complete Complete  PCP or Specialist appointment within 3-5 days of discharge  Complete Complete  HRI or Home Care Consult Complete Complete Complete  SW Recovery Care/Counseling Consult Complete Complete Complete  Palliative Care Screening Not Applicable Not Applicable Not Applicable  Skilled Nursing Facility Not Applicable Complete Complete

## 2022-12-24 NOTE — Progress Notes (Signed)
[facility] Progress Note   Subjective:  Seen in room - sleeping again, but wakes slightly and answers some questions. Appears comfortable. Hypotensive, got hydralazine this AM - will reduce this.  Objective Vitals:   12/24/22 0542 12/24/22 0545 12/24/22 0736 12/24/22 1115  BP:  (!) 120/44 (!) 150/44 (!) 91/37  Pulse:   65 63  Resp:   18 19  Temp:   98.3 F (36.8 C) (!) 97.4 F (36.3 C)  TempSrc:   Oral Oral  SpO2: 94%  100% 100%  Weight:      Height:       Physical Exam General: Chronically ill appearing woman, NAD, drowsy. Nasal O2 in place. Heart: RRR; no murmur Lungs: CTA anteriorly Abdomen: soft Extremities: no LE edema Dialysis Access: [facility]  Additional Objective Labs: Basic Metabolic Panel: Recent Labs  Lab 12/18/22 0138 12/20/22 0858 12/22/22 0010 12/23/22 1124 12/24/22 0045  NA 131*   < > 133* 131* 134*  K 3.9   < > 3.9 4.3 3.8  CL 91*   < > 94* 94* 96*  CO2 27   < > 26 23 26   GLUCOSE 100*   < > 93 80 80  BUN 18   < > 14 33* 13  CREATININE 5.31*   < > 4.15* 7.35* 3.63*  CALCIUM 8.5*   < > 8.0* 7.9* 8.3*  PHOS 3.2  --   --  4.4  --    < > = values in this interval not displayed.   Liver Function Tests: Recent Labs  Lab 12/23/22 1124  ALBUMIN 2.3*   CBC: Recent Labs  Lab 12/18/22 0138 12/20/22 0858 12/21/22 0034 12/22/22 0010 12/24/22 0045  WBC 5.6 10.3 8.9 6.8 5.9  HGB 7.5* 8.6* 6.6* 9.4* 9.5*  HCT 23.3* 26.9* 20.0* 28.8* 29.5*  MCV 110.4* 111.6* 111.1* 99.7 103.5*  PLT 236 235 231 197 188   Medications:   amiodarone  200 mg Oral Daily   atorvastatin  10 mg Oral QHS   calcitRIOL  1.75 mcg Oral Q M,W,F-HD   Chlorhexidine Gluconate Cloth  6 each Topical Q0600   Chlorhexidine Gluconate Cloth  6 each Topical Q0600   cinacalcet  30 mg Oral Q M,W,F-HD   darbepoetin (ARANESP) injection - DIALYSIS  150 mcg Subcutaneous Q Mon-1800   gabapentin  100 mg Oral QPM   hydrALAZINE  50 mg Oral Q8H   levETIRAcetam  500 mg Oral Daily    levothyroxine  75 mcg Oral QAC breakfast   metoprolol tartrate  25 mg Oral BID    Dialysis Orders: MWF at [facility] EDW 58.5kg TDC 2K 2.5Ca BFR 300 DFR 500 Calcitrol 1.12mcg PO q HD Sensipar 30mg  PO q HD Mircera 200 mcg IV q 2 weeks- Last dose 11/28/22   Assessment/Plan: 1.  Paroxysmal Afib: Cardiology initially involved, on amiodarone + metoprolol, no AC due to Hx GIB.   2.  Metapneumovirus: Continues to intermittently spike fevers. Management per PMD.  3. Shortness of breath: Improved s/p serial HD for volume removal. Now well under her prior dry weight, needs to be lowered on discharge. 4.  ESRD: Continue HD on MWF schedule. Clots HD circuit without small amount of heparin. Next 6/17. 5.  Secondary hyperparathyroidism: CorrCa and Phos to goal. Renvela on hold. Continue calcitriol and Sensipar q HD for now. 6.  Hypertension: BP low today. On metoprolol 25mg  BID and hydralazine 50mg  TID -> reduce hydral to 25mg  BID with holding parameters. 7. Anemia of ESRD:  Hgb down to 6.6 on 6/12, s/p 1U PRBCs. Hgb 9.5. Last tsat 88% - no iron needed.  Aranesp given q Monday here (last 6/10). 8. Dispo: Anticipating SNF placement. Palliative care consulted, still full scope for now.   Ozzie Hoyle, PA-C 12/24/2022, 11:39 AM  [employer]

## 2022-12-24 NOTE — Progress Notes (Signed)
PROGRESS NOTE ANALIZA FIEN  ZOX:[id number] DOB: December 10, 1940 DOA: 12/14/2022 PCP: [facility]  Brief Narrative/Hospital Course: 82 year old with past medical history significant for hypertension, hyperlipidemia, CAD status post CABG and PCI, paroxysmal A-fib, no longer on anticoagulation due to recurrent GI bleed, ESRD on hemodialysis (MWF), seizure disorder, hypothyroidism, history of pulmonary. Presented to the ED complaining of chest pain while she was returning from dialysis the day of admission. Reports chest pain sharp in nature, 10 out of 10 associated with palpitation. Patient admission to [facility]/due to AVM, received blood transfusion and was treated with argon plasma hospitalization. Developed paroxysmal Atrial flutter which was difficult to control she was placed on amiodarone and Coreg.  Seen by cardiology now on metoprolol, amiodarone.  Patient not on anticoagulation due to prior history of GI bleeding. ? Of Wide complex tachycardia, axis no change this is related to her left bundle branch block suspicious for atrial tachycardia versus atrial flutter- seen by cardiology Complicated hospitalization due to acute pulmonary edema from fluid overload needing DAILY HD Patient had lethargy acute metabolic encephalopathy with multiple comorbidities, She also tested positive for metapneumovirus, blood culture 6/13 done for fever was no growth so far    Subjective: Seen and examined She appears more alert awake communicative interactive today, ate her breakfast. Endorses generalized weakness Was surprised when I informed her that she had metapneumovirus (like a flu) illness   Assessment and Plan: Principal Problem:   Paroxysmal atrial flutter (HCC) Active Problems:   Macrocytic anemia   End-stage renal disease on hemodialysis (HCC)   Essential hypertension   Diabetes mellitus type 2 in nonobese (HCC)   Acquired hypothyroidism   Mixed hyperlipidemia   Wide-complex  tachycardia  Metapneumovirus infection: Patient had fever 6/13 workup showed metapneumovirus infection COVID screen negative, blood culture  ngtd. CXR-small left pleural effusion with improved left lung base ventilation.  Afebrile for more than 24 hours clinically improving but still with generalized weakness  Atrial tachycardia/paroxysmal atrial fibrillation/flutter/?WCT: axis had no changes so per cardio this is related to her left bundle branch block likely from atrial tachycardia versus atrial flutter that showed as WCT. Cont rhythm control strategy with amiodarone,metoprolol.She is not on anticoagulation due to history of GI bleed.   Chronic systolic CHF with EF 30-35% CAD status post CABG with residual small vessel disease Chest pain due to demand ischemia: Seen by cardiology,known history of CABG vein grafts are patent however has residual small vessel disease that is being managed medically suspect her angina was related to tachycardia. Not on aspirin due to history of GI bleeding Fluid being addressed with HD, dry weight decreased   Acute pulmonary edema/volume overload ESRD on HD MWF: nephrology on board, s/p serial HD on 6/ 11, 6/12, 6/13 due to fluid overload and improved.  Continue HD per schedule.  Dry overt has been lowered  Anemia of chronic renal disease: hb dropped to 6.6 g s/p 1 unit PRBC with HD 6/12> hb improved appropriately holding in 9 g range stable. Previous transfusion was on 5/22.She has history of GI bleed but denies dark stool or any bleeding.  Continue weekly ESA, trend hh intermittently Recent Labs  Lab 12/18/22 0138 12/20/22 0858 12/21/22 0034 12/22/22 0010 12/24/22 0045  HGB 7.5* 8.6* 6.6* 9.4* 9.5*  HCT 23.3* 26.9* 20.0* 28.8* 29.5*     Acute metabolic encephalopathy Lethargy-confusion weakness deconditioning: suspect acute metabolic encephalopathy in the setting of multiple comorbidities, metapneumo virus infection she is much more alert awake  less confused  today still significantly weak continue PT OT and rehabilitation  Secondary hyperparathyroidism Phos at goal holding sevelamer for now. Continue calcitriol and Sensipar 3x weekly per nephrology    Hypertension: BP fluctuating hypotensive during HD- hypertensive otherwise.fairly controlled today.  On hydralazine, metoprolol   Hyperlipidemia:Continue statin Type diabetes mellitus diet controlled Acquired hypothyroidism on levothyroxine.  GOC: She is weak and frail, high risk for readmission, decompensation  with chronic systolic CHF with CAD currently on dialysis dependent now with meta pneumoviral infection. I had called and updated patient's daughter 6/11.Palliative care has been consulted and following closely appreciate input, remains full code.    DVT prophylaxis: SCDs Start: 12/14/22 2155 Code Status:   Code Status: Full Code Family Communication: plan of care discussed with patient at bedside. I called to updated her daughter 6/14, no answer- called her spouse [name] and updated- he is worried "they wouldn't pull it hard in dialysis". Patient status is: Inpatient because of shortness of breath, arteriosclerosis of PRN Level of care: Telemetry Medical   Dispo: The patient is from: home with husband, but currently in SNF since last week            Anticipated disposition: SNF in next 24 hours if remains afebrile  Objective: Vitals last 24 hrs: Vitals:   12/24/22 0533 12/24/22 0542 12/24/22 0545 12/24/22 0736  BP: (!) 120/37  (!) 120/44 (!) 150/44  Pulse: 63   65  Resp: 19   18  Temp: 97.7 F (36.5 C)   98.3 F (36.8 C)  TempSrc: Oral   Oral  SpO2: 90% 94%  100%  Weight: 50.1 kg     Height:       Weight change: -1.7 kg  Physical Examination: General exam: alert awake, pleasant older than stated age HEENT:Oral mucosa moist, Ear/Nose WNL grossly Respiratory system: Bilaterally clear BS, no use of accessory muscle Cardiovascular system: S1 & S2 +, No  JVD. Gastrointestinal system: Abdomen soft,NT,ND, BS+ Nervous System: Alert, awake, moving extremities, she follows commands. Extremities: LE edema neg,distal peripheral pulses palpable.  Skin: No rashes,no icterus. MSK: Normal muscle bulk,tone, power .  Chest [facility] present  Medications reviewed:  Scheduled Meds:  amiodarone  200 mg Oral Daily   atorvastatin  10 mg Oral QHS   calcitRIOL  1.75 mcg Oral Q M,W,F-HD   Chlorhexidine Gluconate Cloth  6 each Topical Q0600   Chlorhexidine Gluconate Cloth  6 each Topical Q0600   cinacalcet  30 mg Oral Q M,W,F-HD   darbepoetin (ARANESP) injection - DIALYSIS  150 mcg Subcutaneous Q Mon-1800   gabapentin  100 mg Oral QPM   hydrALAZINE  50 mg Oral Q8H   levETIRAcetam  500 mg Oral Daily   levothyroxine  75 mcg Oral QAC breakfast   metoprolol tartrate  25 mg Oral BID  Continuous Infusions:  Diet Order             Diet heart healthy/carb modified Room service appropriate? Yes; Fluid consistency: Thin  Diet effective now                   Intake/Output Summary (Last 24 hours) at 12/24/2022 1008 Last data filed at 12/23/2022 2108 Gross per 24 hour  Intake 50 ml  Output 2000 ml  Net -1950 ml   Net IO Since Admission: -8,994.1 mL [12/24/22 1008]  Wt Readings from Last 3 Encounters:  12/24/22 50.1 kg  12/12/22 56.9 kg  11/18/22 59.4 kg     Unresulted Labs (From admission, onward)  Start     Ordered   12/24/22 0500  Basic metabolic panel  Daily,   R     Question:  Specimen collection method  Answer:  Lab=Lab collect   12/23/22 1004   12/24/22 0500  CBC  Daily,   R     Question:  Specimen collection method  Answer:  Lab=Lab collect   12/23/22 1004   Signed and Held  Renal function panel  Once,   R       Question:  Specimen collection method  Answer:  Lab=Lab collect   Signed and Held   Signed and Held  CBC  Once,   R       Question:  Specimen collection method  Answer:  Lab=Lab collect   Signed and Held          Data  Reviewed: I have personally reviewed following labs and imaging studies CBC: Recent Labs  Lab 12/18/22 0138 12/20/22 0858 12/21/22 0034 12/22/22 0010 12/24/22 0045  WBC 5.6 10.3 8.9 6.8 5.9  HGB 7.5* 8.6* 6.6* 9.4* 9.5*  HCT 23.3* 26.9* 20.0* 28.8* 29.5*  MCV 110.4* 111.6* 111.1* 99.7 103.5*  PLT 236 235 231 197 188   Basic Metabolic Panel: Recent Labs  Lab 12/18/22 0138 12/20/22 0858 12/21/22 0034 12/22/22 0010 12/23/22 1124 12/24/22 0045  NA 131* 132* 131* 133* 131* 134*  K 3.9 4.4 4.4 3.9 4.3 3.8  CL 91* 94* 94* 94* 94* 96*  CO2 27 25 21* 26 23 26   GLUCOSE 100* 90 85 93 80 80  BUN 18 15 25* 14 33* 13  CREATININE 5.31* 5.08* 6.10* 4.15* 7.35* 3.63*  CALCIUM 8.5* 8.4* 8.1* 8.0* 7.9* 8.3*  PHOS 3.2  --   --   --  4.4  --    GFR: Estimated Creatinine Clearance: 9.5 mL/min (A) (by C-G formula based on SCr of 3.63 mg/dL (H)). Liver Function Tests: Recent Labs  Lab 12/23/22 1124  ALBUMIN 2.3*    Recent Results (from the past 240 hour(s))  Respiratory (~20 pathogens) panel by PCR     Status: Abnormal   Collection Time: 12/22/22  8:32 AM   Specimen: Nasopharyngeal Swab; Respiratory  Result Value Ref Range Status   Adenovirus NOT DETECTED NOT DETECTED Final   Coronavirus 229E NOT DETECTED NOT DETECTED Final    Comment: (NOTE) The Coronavirus on the Respiratory Panel, DOES NOT test for the novel  Coronavirus (2019 nCoV)    Coronavirus HKU1 NOT DETECTED NOT DETECTED Final   Coronavirus NL63 NOT DETECTED NOT DETECTED Final   Coronavirus OC43 NOT DETECTED NOT DETECTED Final   Metapneumovirus DETECTED (A) NOT DETECTED Final   Rhinovirus / Enterovirus NOT DETECTED NOT DETECTED Final   Influenza A NOT DETECTED NOT DETECTED Final   Influenza B NOT DETECTED NOT DETECTED Final   Parainfluenza Virus 1 NOT DETECTED NOT DETECTED Final   Parainfluenza Virus 2 NOT DETECTED NOT DETECTED Final   Parainfluenza Virus 3 NOT DETECTED NOT DETECTED Final   Parainfluenza Virus 4 NOT  DETECTED NOT DETECTED Final   Respiratory Syncytial Virus NOT DETECTED NOT DETECTED Final   Bordetella pertussis NOT DETECTED NOT DETECTED Final   Bordetella Parapertussis NOT DETECTED NOT DETECTED Final   Chlamydophila pneumoniae NOT DETECTED NOT DETECTED Final   Mycoplasma pneumoniae NOT DETECTED NOT DETECTED Final    Comment: Performed at [facility], [address] [place], [place] [address]  SARS Coronavirus 2 by RT PCR (hospital order, performed in [facility] hospital  lab) *cepheid single result test* Anterior Nasal Swab     Status: None   Collection Time: 12/22/22  8:33 AM   Specimen: Anterior Nasal Swab  Result Value Ref Range Status   SARS Coronavirus 2 by RT PCR NEGATIVE NEGATIVE Final    Comment: Performed at [facility], [address], [place], [place] [address]  Culture, blood (Routine X 2) w Reflex to ID Panel     Status: None (Preliminary result)   Collection Time: 12/22/22  5:58 PM   Specimen: BLOOD RIGHT HAND  Result Value Ref Range Status   Specimen Description BLOOD RIGHT HAND  Final   Special Requests   Final    BOTTLES DRAWN AEROBIC AND ANAEROBIC Blood Culture adequate volume   Culture   Final    NO GROWTH 2 DAYS Performed at [facility], [address] [place], [place] [address]    Report Status PENDING  Incomplete  Culture, blood (Routine X 2) w Reflex to ID Panel     Status: None (Preliminary result)   Collection Time: 12/22/22  5:58 PM   Specimen: BLOOD RIGHT HAND  Result Value Ref Range Status   Specimen Description BLOOD RIGHT HAND  Final   Special Requests   Final    BOTTLES DRAWN AEROBIC AND ANAEROBIC Blood Culture adequate volume   Culture   Final    NO GROWTH 2 DAYS Performed at [facility], [address], [place], [place] [address]    Report Status PENDING  Incomplete  Antimicrobials: Anti-infectives (From admission, onward)    None     Culture/Microbiology    Component Value Date/Time   SDES BLOOD RIGHT  HAND 12/22/2022 1758   SDES BLOOD RIGHT HAND 12/22/2022 1758   SPECREQUEST  12/22/2022 1758    BOTTLES DRAWN AEROBIC AND ANAEROBIC Blood Culture adequate volume   SPECREQUEST  12/22/2022 1758    BOTTLES DRAWN AEROBIC AND ANAEROBIC Blood Culture adequate volume   CULT  12/22/2022 1758    NO GROWTH 2 DAYS Performed at [facility], [address], [place], [place] [address]    CULT  12/22/2022 1758    NO GROWTH 2 DAYS Performed at [facility], [address] [place], [place] [address]    REPTSTATUS PENDING 12/22/2022 1758   REPTSTATUS PENDING 12/22/2022 1758  Radiology Studies: No results found.  LOS: 10 days  Lanae Boast, MD [facility]  12/24/2022, 10:08 AM

## 2022-12-24 NOTE — [place] FL2 (Signed)
[place] MEDICAID FL2 LEVEL OF CARE FORM     IDENTIFICATION  Patient Name: Shawna [name] Birthdate: Jun 10, 1941 Sex: female Admission Date (Current Location): 12/14/2022  [facility] and [place] Number:  Producer, television/film/video and Address:  [facility] [place], [address], [place], [place] [address]      Provider Number: [id number]  Attending Physician Name and Address:  Lanae Boast, MD  Relative Name and Phone Number:       Current Level of Care: Hospital Recommended Level of Care: Skilled Nursing Facility Prior Approval Number:    Date Approved/Denied:   PASRR Number: [id number]  Discharge Plan: SNF    Current Diagnoses: Patient Active Problem List   Diagnosis Date Noted   Paroxysmal atrial flutter (HCC) 12/14/2022   Pressure injury of skin 12/01/2022   Rectal bleeding 11/30/2022   Generalized abdominal pain 11/10/2022   Ileus (HCC) 11/04/2022   Periumbilical abdominal pain 11/04/2022   Macrocytic anemia 10/29/2022   Anemia of chronic disease 10/29/2022   Upper GI bleed 10/29/2022   Iron deficiency anemia due to chronic blood loss 08/26/2022   Benign neoplasm of transverse colon 08/11/2022   Benign neoplasm of descending colon 08/11/2022   Benign neoplasm of sigmoid colon 08/11/2022   Angiodysplasia of duodenum 08/11/2022   Delirium 04/22/2022   History of arteriovenous malformation (AVM) 04/22/2022   Acute metabolic encephalopathy 11/09/2021   NSTEMI, initial episode of care (HCC) 10/19/2021   Acute on chronic blood loss anemia 09/09/2021   Ischemic cardiomyopathy    Atrial fibrillation, chronic (HCC) 05/07/2021   Neuropathy 04/23/2021   Anemia associated with chronic renal failure 04/13/2021   Left knee pain 03/15/2021   Moderate protein-calorie malnutrition (HCC) 02/27/2021   Hypokalemia 02/27/2021   COVID-19 virus infection 02/03/2021   Leukocytosis 02/03/2021   Acquired hypothyroidism 02/03/2021   Myositis 12/03/2020   Ataxia  12/02/2020   Diabetes mellitus type 2 in nonobese (HCC) 11/10/2020   Failure to thrive in adult 11/09/2020   Dialysis AV fistula malfunction, initial encounter (HCC)    Jugular vein occlusion, right (HCC)    Failure of surgically constructed arteriovenous fistula (HCC) 10/03/2020   Myoclonus 08/31/2020   Clotted renal dialysis AV graft, initial encounter (HCC)    Hemodialysis-associated hypotension    Irritable bowel syndrome 02/25/2020   Adenomatous duodenal polyp 09/10/2019   History of GI bleed 09/10/2019   Hand steal syndrome (HCC) 08/01/2017   CAD (coronary artery disease) 06/05/2017   Intestinal ischemia (HCC)    Complication of vascular access for dialysis 03/19/2017   Preoperative clearance 01/25/2017   H/O non-ST elevation myocardial infarction (NSTEMI) 10/24/2016   Non-ST elevation (NSTEMI) myocardial infarction [facility])    Heme positive stool    Cardiac arrest [facility])    Palliative care encounter    Goals of care, counseling/discussion    Flash pulmonary edema (HCC) 04/06/2016   History of colon cancer 01/27/2016   History of ovarian cancer 01/27/2016   PAF (paroxysmal atrial fibrillation) (HCC) 10/14/2015   Malignant neoplasm of right ovary (HCC) 10/14/2015   Wide-complex tachycardia 09/08/2015   SVT (supraventricular tachycardia) 09/08/2015   Essential hypertension    Dyspnea    Constipation 03/12/2013   Abdominal wall pain in left flank 03/12/2013   Occlusion and stenosis of carotid artery without mention of cerebral infarction 01/24/2013   Hx of CABG 07/05/2012   Carotid artery disease (HCC) 07/05/2012   Mitral regurgitation 06/12/2012   Non-STEMI (non-ST elevated myocardial infarction) (HCC) 06/08/2012  Chronic combined systolic and diastolic CHF (congestive heart failure) (HCC) 05/02/2012   AVM (arteriovenous malformation) of small bowel, acquired with hemorrhage 01/20/2012   GERD (gastroesophageal reflux disease) 01/09/2012   Mixed hyperlipidemia 01/05/2012    Atherosclerotic heart disease of native coronary artery without angina pectoris 12/16/2011   Anxiety disorder 05/04/2011   End-stage renal disease on hemodialysis (HCC) 04/29/2011   Gout 04/29/2011    Orientation RESPIRATION BLADDER Height & Weight     Self, Time, Situation, Place  O2 (2 liters) Continent Weight: 110 lb 7.2 oz (50.1 kg) Height:  5\' 2"  (157.5 cm)  BEHAVIORAL SYMPTOMS/MOOD NEUROLOGICAL BOWEL NUTRITION STATUS      Incontinent Diet (See dc summary)  AMBULATORY STATUS COMMUNICATION OF NEEDS Skin   Extensive Assist Verbally Normal                       Personal Care Assistance Level of Assistance  Bathing, Feeding, Dressing Bathing Assistance: Maximum assistance Feeding assistance: Independent Dressing Assistance: Maximum assistance     Functional Limitations Info  Sight, Hearing, Speech Sight Info: Impaired Hearing Info: Adequate Speech Info: Adequate    SPECIAL CARE FACTORS FREQUENCY  PT (By licensed PT), OT (By licensed OT)     PT Frequency: 5xweek OT Frequency: 5xweek            Contractures Contractures Info: Not present    Additional Factors Info  Code Status, Allergies Code Status Info: Full Allergies Info: Amlodipine  Aspirin  Nitrofurantoin  Ranexa (Ranolazine)  Bactrim (Sulfamethoxazole-trimethoprim)  Contrast Media (Iodinated Contrast Media)  Iron  Gabapentin  Iron Sucrose  Sucroferric Oxyhydroxide  Levaquin (Levofloxacin In D5w)  Pantoprazole  Plavix (Clopidogrel Bisulfate)  Protonix (Pantoprazole Sodium)  Venofer (Ferric Oxide           Current Medications (12/24/2022):  This is the current hospital active medication list Current Facility-Administered Medications  Medication Dose Route Frequency Provider Last Rate Last Admin   acetaminophen (TYLENOL) tablet 650 mg  650 mg Oral Q6H PRN Adefeso, Oladapo, DO   650 mg at 12/23/22 1933   Or   acetaminophen (TYLENOL) suppository 650 mg  650 mg Rectal Q6H PRN Adefeso, Oladapo, DO   650 mg  at 12/23/22 0446   ALPRAZolam (XANAX) tablet 0.5 mg  0.5 mg Oral Daily PRN Regalado, Belkys A, MD   0.5 mg at 12/19/22 2124   amiodarone (PACERONE) tablet 200 mg  200 mg Oral Daily Nahser, Deloris Ping, MD       atorvastatin (LIPITOR) tablet 10 mg  10 mg Oral QHS Regalado, Belkys A, MD   10 mg at 12/23/22 2108   benzonatate (TESSALON) capsule 100 mg  100 mg Oral TID PRN Sharlene Dory, DO   100 mg at 12/23/22 2108   calcitRIOL (ROCALTROL) capsule 1.75 mcg  1.75 mcg Oral Q M,W,F-HD Pola Corn, NP   1.75 mcg at 12/21/22 1407   Chlorhexidine Gluconate Cloth 2 % PADS 6 each  6 each Topical Q0600 Penninger, Lillia Abed, PA   6 each at 12/22/22 0509   Chlorhexidine Gluconate Cloth 2 % PADS 6 each  6 each Topical Q0600 Penninger, Lillia Abed, [place]   6 each at 12/24/22 0604   cinacalcet (SENSIPAR) tablet 30 mg  30 mg Oral Q M,W,F-HD Pola Corn, NP   30 mg at 12/21/22 1408   Darbepoetin Alfa (ARANESP) injection 150 mcg  150 mcg Subcutaneous Q Mon-1800 Salome Holmes E, NP   150 mcg at 12/19/22 1742  gabapentin (NEURONTIN) capsule 100 mg  100 mg Oral QPM Regalado, Belkys A, MD   100 mg at 12/23/22 1805   guaiFENesin-dextromethorphan (ROBITUSSIN DM) 100-10 MG/5ML syrup 5 mL  5 mL Oral Q4H PRN Regalado, Belkys A, MD   5 mL at 12/22/22 1428   hydrALAZINE (APRESOLINE) tablet 50 mg  50 mg Oral Q8H Kc, Ramesh, MD   50 mg at 12/24/22 0601   levETIRAcetam (KEPPRA) tablet 500 mg  500 mg Oral Daily Regalado, Belkys A, MD   500 mg at 12/23/22 1805   levothyroxine (SYNTHROID) tablet 75 mcg  75 mcg Oral QAC breakfast Regalado, Belkys A, MD   75 mcg at 12/24/22 0601   metoprolol tartrate (LOPRESSOR) tablet 25 mg  25 mg Oral BID Kc, Ramesh, MD   25 mg at 12/23/22 0828   ondansetron (ZOFRAN) tablet 4 mg  4 mg Oral Q6H PRN Adefeso, Oladapo, DO   4 mg at 12/16/22 0313   Or   ondansetron (ZOFRAN) injection 4 mg  4 mg Intravenous Q6H PRN Adefeso, Oladapo, DO   4 mg at 12/20/22 0801     Discharge  Medications: Please see discharge summary for a list of discharge medications.  Relevant Imaging Results:  Relevant Lab Results:   Additional Information SSN: [id number].  Dialysis patient MWF at [facility] (10:30am arrival time, 10:45am chair time)  Oletta Lamas, MSW, LCSWA, LCASA Transitions of Care  Clinical Social Worker I

## 2022-12-25 DIAGNOSIS — I4892 Unspecified atrial flutter: Secondary | ICD-10-CM | POA: Diagnosis not present

## 2022-12-25 LAB — CBC
HCT: 29 % — ABNORMAL LOW (ref 36.0–46.0)
Hemoglobin: 9.3 g/dL — ABNORMAL LOW (ref 12.0–15.0)
MCH: 33.6 pg (ref 26.0–34.0)
MCHC: 32.1 g/dL (ref 30.0–36.0)
MCV: 104.7 fL — ABNORMAL HIGH (ref 80.0–100.0)
Platelets: 203 10*3/uL (ref 150–400)
RBC: 2.77 MIL/uL — ABNORMAL LOW (ref 3.87–5.11)
RDW: 24 % — ABNORMAL HIGH (ref 11.5–15.5)
WBC: 9.2 10*3/uL (ref 4.0–10.5)
nRBC: 0 % (ref 0.0–0.2)

## 2022-12-25 LAB — TYPE AND SCREEN
ABO/RH(D): O POS
Antibody Screen: NEGATIVE
Donor AG Type: NEGATIVE
Donor AG Type: NEGATIVE
Unit division: 0
Unit division: 0

## 2022-12-25 LAB — BPAM RBC
Blood Product Expiration Date: 202406292359
Blood Product Expiration Date: 202407122359
Unit Type and Rh: 5100

## 2022-12-25 LAB — BASIC METABOLIC PANEL
Anion gap: 11 (ref 5–15)
BUN: 25 mg/dL — ABNORMAL HIGH (ref 8–23)
CO2: 25 mmol/L (ref 22–32)
Calcium: 8.3 mg/dL — ABNORMAL LOW (ref 8.9–10.3)
Chloride: 93 mmol/L — ABNORMAL LOW (ref 98–111)
Creatinine, Ser: 5.54 mg/dL — ABNORMAL HIGH (ref 0.44–1.00)
GFR, Estimated: 7 mL/min — ABNORMAL LOW (ref >60)
Glucose, Bld: 102 mg/dL — ABNORMAL HIGH (ref 70–99)
Potassium: 3.9 mmol/L (ref 3.5–5.1)
Sodium: 129 mmol/L — ABNORMAL LOW (ref 135–145)

## 2022-12-25 LAB — CULTURE, BLOOD (ROUTINE X 2)
Culture: NO GROWTH
Special Requests: ADEQUATE
Special Requests: ADEQUATE

## 2022-12-25 NOTE — Progress Notes (Signed)
[facility] Progress Note   Subjective:  Seen in room - sitting on edge of bed. Polished off her breakfast. No CP/dyspnea, still feels very weak. Reports had slight fall yesterday trying to go to bathroom. For SNF on discharge per notes.  Objective Vitals:   12/25/22 0025 12/25/22 0400 12/25/22 0409 12/25/22 0907  BP: (!) 121/43 (!) 144/47  (!) 111/96  Pulse: 60 60  70  Resp: 18 18  18   Temp: (!) 97.5 F (36.4 C) 97.7 F (36.5 C)  (!) 97.4 F (36.3 C)  TempSrc: Axillary Oral  Oral  SpO2: 100% 100%  100%  Weight:   50.3 kg   Height:       Physical Exam General: Chronically frail appearing woman, NAD, awake/conversant. Nasal O2 in place. Heart: RRR; no murmur Lungs: CTA anteriorly Abdomen: soft Extremities: no LE edema Dialysis Access: [facility]  Additional Objective Labs: Basic Metabolic Panel: Recent Labs  Lab 12/23/22 1124 12/24/22 0045 12/25/22 0105  NA 131* 134* 129*  K 4.3 3.8 3.9  CL 94* 96* 93*  CO2 23 26 25   GLUCOSE 80 80 102*  BUN 33* 13 25*  CREATININE 7.35* 3.63* 5.54*  CALCIUM 7.9* 8.3* 8.3*  PHOS 4.4  --   --    Liver Function Tests: Recent Labs  Lab 12/23/22 1124  ALBUMIN 2.3*   CBC: Recent Labs  Lab 12/20/22 0858 12/21/22 0034 12/22/22 0010 12/24/22 0045 12/25/22 0105  WBC 10.3 8.9 6.8 5.9 9.2  HGB 8.6* 6.6* 9.4* 9.5* 9.3*  HCT 26.9* 20.0* 28.8* 29.5* 29.0*  MCV 111.6* 111.1* 99.7 103.5* 104.7*  PLT 235 231 197 188 203   Blood Culture    Component Value Date/Time   SDES BLOOD RIGHT HAND 12/22/2022 1758   SDES BLOOD RIGHT HAND 12/22/2022 1758   SPECREQUEST  12/22/2022 1758    BOTTLES DRAWN AEROBIC AND ANAEROBIC Blood Culture adequate volume   SPECREQUEST  12/22/2022 1758    BOTTLES DRAWN AEROBIC AND ANAEROBIC Blood Culture adequate volume   CULT  12/22/2022 1758    NO GROWTH 3 DAYS Performed at [facility], [address], [place], [place] [address]    CULT  12/22/2022 1758    NO GROWTH 3 DAYS Performed at  [facility], [name] [place], [place] [address]    REPTSTATUS PENDING 12/22/2022 1758   REPTSTATUS PENDING 12/22/2022 1758   Medications:   amiodarone  200 mg Oral Daily   atorvastatin  10 mg Oral QHS   calcitRIOL  1.75 mcg Oral Q M,W,F-HD   Chlorhexidine Gluconate Cloth  6 each Topical Q0600   Chlorhexidine Gluconate Cloth  6 each Topical Q0600   cinacalcet  30 mg Oral Q M,W,F-HD   darbepoetin (ARANESP) injection - DIALYSIS  150 mcg Subcutaneous Q Mon-1800   gabapentin  100 mg Oral QPM   hydrALAZINE  25 mg Oral BID   levETIRAcetam  500 mg Oral Daily   levothyroxine  75 mcg Oral QAC breakfast   metoprolol tartrate  25 mg Oral BID    Dialysis Orders: MWF at [facility] EDW 58.5kg TDC 2K 2.5Ca BFR 300 DFR 500 Calcitrol 1.13mcg PO q HD Sensipar 30mg  PO q HD Mircera 200 mcg IV q 2 weeks- Last dose 11/28/22   Assessment/Plan: 1.  Paroxysmal Afib: Cardiology initially involved, on amiodarone + metoprolol, no AC due to Hx GIB.   2.  Metapneumovirus: Fevers resolved. Management per PMD.  3. Shortness of breath: Improved s/p serial  HD for volume removal. Now well under her prior dry weight, needs to be lowered on discharge. 4.  ESRD: Continue HD on MWF schedule. Clots HD circuit without small amount of heparin. Next 6/17. 5.  Secondary hyperparathyroidism: CorrCa and Phos to goal. Renvela on hold. Continue calcitriol and Sensipar q HD for now. 6.  Hypertension: BP low 6/15. On metoprolol 25mg  BID and hydralazine 50mg  TID -> reduced hydral to 25mg  BID with holding parameters -> better today. 7. Anemia of ESRD: Hgb down to 6.6 on 6/12, s/p 1U PRBCs. Hgb 9.3. Last tsat 88% - no iron needed.  Aranesp given q Monday here (last 6/10). 8. Dispo: Anticipating SNF placement. Palliative care consulted, still full scope for now.     Ozzie Hoyle, PA-C 12/25/2022, 9:40 AM  [employer]

## 2022-12-25 NOTE — Progress Notes (Signed)
Brief Palliative Medicine Progress Note:  PMT following peripherally for needs/decline:  Medical records reviewed including progress notes, labs, imaging.   Goals are clear for full code/full scope. Plan is for discharge back to SNF with outpatient Palliative Care to follow - [facility] consult previously placed. PMT will follow peripherally and visit with patient and family incrementally for goals of care discussions as appropriate and based on clinical course. If there are any imminent needs please call the service directly. Family also has PMT contact information should further needs arise.  Thank you for allowing PMT to assist in the care of this patient.  Lus Kriegel M. [name] [facility] Palliative Medicine Team Team Phone: [phone] NO CHARGE

## 2022-12-25 NOTE — Progress Notes (Signed)
Shawna [name]  ZOX:[id number] DOB: Aug 10, 1940 DOA: 12/14/2022 PCP: [facility]  Brief Narrative/Hospital Course: 82 year old with past medical history significant for hypertension, hyperlipidemia, CAD status post CABG and PCI, paroxysmal A-fib, no longer on anticoagulation due to recurrent GI bleed, ESRD on hemodialysis (MWF), seizure disorder, hypothyroidism, history of pulmonary. Presented to the ED complaining of chest pain while she was returning from dialysis the day of admission. Reports chest pain sharp in nature, 10 out of 10 associated with palpitation. Patient admission to [facility]/due to AVM, received blood transfusion and was treated with argon plasma hospitalization. Developed paroxysmal Atrial flutter which was difficult to control she was placed on amiodarone and Coreg.  Seen by cardiology now on metoprolol, amiodarone.  Patient not on anticoagulation due to prior history of GI bleeding. ? Of Wide complex tachycardia, axis no change this is related to her left bundle branch block suspicious for atrial tachycardia versus atrial flutter- seen by cardiology Complicated hospitalization due to acute pulmonary edema from fluid overload needing DAILY HD Patient had lethargy acute metabolic encephalopathy with multiple comorbidities, She also tested positive for metapneumovirus, blood culture 6/13 done for fever was no growth so far    Subjective: Patient seen and examined this morning Woke up for me able to interact well Appears more alert awake stronger able to move all her extremities Past 24 hours afebrile BP at times hypotensive -hydralazine dose decreased  Labs with a stable hemoglobin Reports she could not get up after sitting on the toilet yesterday  Assessment and Plan: Principal Problem:   Paroxysmal atrial flutter (HCC) Active Problems:   Macrocytic anemia   End-stage renal disease on hemodialysis (HCC)   Essential hypertension   Diabetes  mellitus type 2 in nonobese [facility])   Acquired hypothyroidism   Mixed hyperlipidemia   Wide-complex tachycardia  Metapneumovirus infection: Patient had fever 6/13 workup showed metapneumovirus infection+- otherwise negative COVID,blood culture  ngtd. CXR no pneumonia but small left pleural effusion with improved left lung base ventilation.  No more fever spike since 6/13 but still with generalized weakness  Atrial tachycardia/paroxysmal atrial fibrillation/flutter/?WCT: axis had no changes so per cardio WCT is related to her left bundle branch block likely from atrial tachycardia versus atrial flutter that showed as WCT.  Cont rhythm control strategy with amiodarone,metoprolol. She is not on anticoagulation due to history of GI bleed.   Chronic systolic CHF with EF 30-35% CAD status post CABG with residual small vessel disease Chest pain due to demand ischemia: known history of CABG vein grafts are patent however has residual small vessel disease that is being managed medically suspect her angina was related to tachycardia. Not on aspirin due to history of GI bleeding Cardiology consulted, signed off, fluid being addressed with HD, dry weight decreased   Acute pulmonary edema/volume overload ESRD on HD MWF: nephrology on board, s/p serial HD on 6/ 11, 6/12, 6/13 due to fluid overload and over improved, dry weight has been decreased  AoCKD/Anemia of chronic renal disease: hb dropped to 6.6 g s/p 1 unit PRBC with HD 6/12> hb improved appropriately holding in 9 g range stable. Previous transfusion was on 5/22.She has history of GI bleed but denies dark stool or any bleeding.Continue weekly ESA,  Acute metabolic encephalopathy Lethargy-confusion weakness deconditioning and debility: This is in the setting of patient's metapneumovirus infection, fluid overload number comorbidities now much more alert awake but he still appears weak and will benefit with skilled nursing facility  Secondary  hyperparathyroidism  Phos at goal holding sevelamer for now. Continue calcitriol and Sensipar 3x weekly per nephrology    Hypertension: BP was low yesterday on non HD day 6/15: Hydralazine dose decreased, continue metoprolol.  Appreciate nephrology management  Hyperlipidemia:Continue statin  Type diabetes mellitus diet controlled  Acquired hypothyroidism on levothyroxine.  GOC: She is weak and frail, high risk for readmission, decompensation  with chronic systolic CHF with CAD currently on dialysis dependent now with meta pneumoviral infection. I had called and updated patient's daughter 6/11.Palliative care has been consulted and following closely appreciate input, remains full code.    DVT prophylaxis: SCDs Start: 12/14/22 2155 Code Status:   Code Status: Full Code Family Communication: plan of care discussed with patient at bedside. I called to updated her daughter 6/14, no answer-previously called and updated her husband as well.   Patient status is: Inpatient because of shortness of breath, arteriosclerosis of PRN Level of care: Telemetry Medical   Dispo: The patient is from: home with husband, but currently in SNF since last week            Anticipated disposition: Rn informed>TOC to start insurance authorization as she is closer to being stable for a skilled nursing facility   Objective: Vitals last 24 hrs: Vitals:   12/25/22 0025 12/25/22 0400 12/25/22 0409 12/25/22 0907  BP: (!) 121/43 (!) 144/47  (!) 111/96  Pulse: 60 60  70  Resp: 18 18  18   Temp: (!) 97.5 F (36.4 C) 97.7 F (36.5 C)  (!) 97.4 F (36.3 C)  TempSrc: Axillary Oral  Oral  SpO2: 100% 100%  100%  Weight:   50.3 kg   Height:       Weight change: 0.1 kg  Physical Examination: General exam: AA, weak,older appearing HEENT:Oral mucosa moist, Ear/Nose WNL grossly, dentition normal. Respiratory system: bilaterally clear BS, chest TDC+ , no use of accessory muscle Cardiovascular system: S1 & S2 +, regular  rate. Gastrointestinal system: Abdomen soft, NT,ND,BS+ Nervous System:Alert, awake, moving extremities and grossly nonfocal Extremities: LE ankle edema neg, lower extremities warm Skin: No rashes,no icterus. MSK: Normal muscle bulk,tone, power  Chest TDC+  Medications reviewed:  Scheduled Meds:  amiodarone  200 mg Oral Daily   atorvastatin  10 mg Oral QHS   calcitRIOL  1.75 mcg Oral Q M,W,F-HD   Chlorhexidine Gluconate Cloth  6 each Topical Q0600   Chlorhexidine Gluconate Cloth  6 each Topical Q0600   cinacalcet  30 mg Oral Q M,W,F-HD   darbepoetin (ARANESP) injection - DIALYSIS  150 mcg Subcutaneous Q Mon-1800   gabapentin  100 mg Oral QPM   hydrALAZINE  25 mg Oral BID   levETIRAcetam  500 mg Oral Daily   levothyroxine  75 mcg Oral QAC breakfast   metoprolol tartrate  25 mg Oral BID  Continuous Infusions:  Diet Order             Diet heart healthy/carb modified Room service appropriate? Yes; Fluid consistency: Thin  Diet effective now                   Intake/Output Summary (Last 24 hours) at 12/25/2022 1026 Last data filed at 12/25/2022 0900 Gross per 24 hour  Intake 170 ml  Output --  Net 170 ml   Net IO Since Admission: -8,824.1 mL [12/25/22 1026]  Wt Readings from Last 3 Encounters:  12/25/22 50.3 kg  12/12/22 56.9 kg  11/18/22 59.4 kg     Unresulted Labs (From admission, onward)  Start     Ordered   12/24/22 0500  Basic metabolic panel  Daily,   R     Question:  Specimen collection method  Answer:  Lab=Lab collect   12/23/22 1004   12/24/22 0500  CBC  Daily,   R     Question:  Specimen collection method  Answer:  Lab=Lab collect   12/23/22 1004   Signed and Held  Renal function panel  Once,   R       Question:  Specimen collection method  Answer:  Lab=Lab collect   Signed and Held   Signed and Held  CBC  Once,   R       Question:  Specimen collection method  Answer:  Lab=Lab collect   Signed and Held          Data Reviewed: I have personally  reviewed following labs and imaging studies CBC: Recent Labs  Lab 12/20/22 0858 12/21/22 0034 12/22/22 0010 12/24/22 0045 12/25/22 0105  WBC 10.3 8.9 6.8 5.9 9.2  HGB 8.6* 6.6* 9.4* 9.5* 9.3*  HCT 26.9* 20.0* 28.8* 29.5* 29.0*  MCV 111.6* 111.1* 99.7 103.5* 104.7*  PLT 235 231 197 188 203  Basic Metabolic Panel: Recent Labs  Lab 12/21/22 0034 12/22/22 0010 12/23/22 1124 12/24/22 0045 12/25/22 0105  NA 131* 133* 131* 134* 129*  K 4.4 3.9 4.3 3.8 3.9  CL 94* 94* 94* 96* 93*  CO2 21* 26 23 26 25   GLUCOSE 85 93 80 80 102*  BUN 25* 14 33* 13 25*  CREATININE 6.10* 4.15* 7.35* 3.63* 5.54*  CALCIUM 8.1* 8.0* 7.9* 8.3* 8.3*  PHOS  --   --  4.4  --   --   BJY:NWGNFAOZH Creatinine Clearance: 6.2 mL/min (A) (by C-G formula based on SCr of 5.54 mg/dL (H)). Liver Function Tests: Recent Labs  Lab 12/23/22 1124  ALBUMIN 2.3*   Recent Results (from the past 240 hour(s))  Respiratory (~20 pathogens) panel by PCR     Status: Abnormal   Collection Time: 12/22/22  8:32 AM   Specimen: Nasopharyngeal Swab; Respiratory  Result Value Ref Range Status   Adenovirus NOT DETECTED NOT DETECTED Final   Coronavirus 229E NOT DETECTED NOT DETECTED Final    Comment: (NOTE) The Coronavirus on the Respiratory Panel, DOES NOT test for the novel  Coronavirus (2019 nCoV)    Coronavirus HKU1 NOT DETECTED NOT DETECTED Final   Coronavirus NL63 NOT DETECTED NOT DETECTED Final   Coronavirus OC43 NOT DETECTED NOT DETECTED Final   Metapneumovirus DETECTED (A) NOT DETECTED Final   Rhinovirus / Enterovirus NOT DETECTED NOT DETECTED Final   Influenza A NOT DETECTED NOT DETECTED Final   Influenza B NOT DETECTED NOT DETECTED Final   Parainfluenza Virus 1 NOT DETECTED NOT DETECTED Final   Parainfluenza Virus 2 NOT DETECTED NOT DETECTED Final   Parainfluenza Virus 3 NOT DETECTED NOT DETECTED Final   Parainfluenza Virus 4 NOT DETECTED NOT DETECTED Final   Respiratory Syncytial Virus NOT DETECTED NOT DETECTED  Final   Bordetella pertussis NOT DETECTED NOT DETECTED Final   Bordetella Parapertussis NOT DETECTED NOT DETECTED Final   Chlamydophila pneumoniae NOT DETECTED NOT DETECTED Final   Mycoplasma pneumoniae NOT DETECTED NOT DETECTED Final    Comment: Performed at [facility], [address], [place], [place] [address]  SARS Coronavirus 2 by RT PCR (hospital order, performed in [facility] hospital lab) *cepheid single result test* Anterior Nasal Swab     Status: None  Collection Time: 12/22/22  8:33 AM   Specimen: Anterior Nasal Swab  Result Value Ref Range Status   SARS Coronavirus 2 by RT PCR NEGATIVE NEGATIVE Final    Comment: Performed at [facility], [address], [place], [place] [address]  Culture, blood (Routine X 2) w Reflex to ID Panel     Status: None (Preliminary result)   Collection Time: 12/22/22  5:58 PM   Specimen: BLOOD RIGHT HAND  Result Value Ref Range Status   Specimen Description BLOOD RIGHT HAND  Final   Special Requests   Final    BOTTLES DRAWN AEROBIC AND ANAEROBIC Blood Culture adequate volume   Culture   Final    NO GROWTH 3 DAYS Performed at [facility], [address] [place], [place] [address]    Report Status PENDING  Incomplete  Culture, blood (Routine X 2) w Reflex to ID Panel     Status: None (Preliminary result)   Collection Time: 12/22/22  5:58 PM   Specimen: BLOOD RIGHT HAND  Result Value Ref Range Status   Specimen Description BLOOD RIGHT HAND  Final   Special Requests   Final    BOTTLES DRAWN AEROBIC AND ANAEROBIC Blood Culture adequate volume   Culture   Final    NO GROWTH 3 DAYS Performed at [facility], [address] [place], [place] [address]    Report Status PENDING  Incomplete  Antimicrobials: Anti-infectives (From admission, onward)    None     Culture/Microbiology    Component Value Date/Time   SDES BLOOD RIGHT HAND 12/22/2022 1758   SDES BLOOD RIGHT HAND 12/22/2022 1758   SPECREQUEST   12/22/2022 1758    BOTTLES DRAWN AEROBIC AND ANAEROBIC Blood Culture adequate volume   SPECREQUEST  12/22/2022 1758    BOTTLES DRAWN AEROBIC AND ANAEROBIC Blood Culture adequate volume   CULT  12/22/2022 1758    NO GROWTH 3 DAYS Performed at [facility], [address] [place], [place] [address]    CULT  12/22/2022 1758    NO GROWTH 3 DAYS Performed at [facility], [address], [place], [place] [address]    REPTSTATUS PENDING 12/22/2022 1758   REPTSTATUS PENDING 12/22/2022 1758  Radiology Studies: No results found.  LOS: 11 days  Lanae Boast, MD [facility]  12/25/2022, 10:26 AM

## 2022-12-26 DIAGNOSIS — I4892 Unspecified atrial flutter: Secondary | ICD-10-CM | POA: Diagnosis not present

## 2022-12-26 LAB — CBC
HCT: 29.2 % — ABNORMAL LOW (ref 36.0–46.0)
Hemoglobin: 9.5 g/dL — ABNORMAL LOW (ref 12.0–15.0)
MCH: 33.7 pg (ref 26.0–34.0)
MCHC: 32.5 g/dL (ref 30.0–36.0)
MCV: 103.5 fL — ABNORMAL HIGH (ref 80.0–100.0)
Platelets: 229 10*3/uL (ref 150–400)
RBC: 2.82 MIL/uL — ABNORMAL LOW (ref 3.87–5.11)
RDW: 23.6 % — ABNORMAL HIGH (ref 11.5–15.5)
WBC: 10.2 10*3/uL (ref 4.0–10.5)
nRBC: 0 % (ref 0.0–0.2)

## 2022-12-26 LAB — BASIC METABOLIC PANEL
Anion gap: 12 (ref 5–15)
BUN: 39 mg/dL — ABNORMAL HIGH (ref 8–23)
CO2: 26 mmol/L (ref 22–32)
Calcium: 8.6 mg/dL — ABNORMAL LOW (ref 8.9–10.3)
Chloride: 90 mmol/L — ABNORMAL LOW (ref 98–111)
Creatinine, Ser: 7.33 mg/dL — ABNORMAL HIGH (ref 0.44–1.00)
GFR, Estimated: 5 mL/min — ABNORMAL LOW (ref >60)
Glucose, Bld: 114 mg/dL — ABNORMAL HIGH (ref 70–99)
Potassium: 4.1 mmol/L (ref 3.5–5.1)
Sodium: 128 mmol/L — ABNORMAL LOW (ref 135–145)

## 2022-12-26 MED ORDER — HEPARIN SODIUM (PORCINE) 1000 UNIT/ML IJ SOLN
INTRAMUSCULAR | Status: AC
  Administered 2022-12-26: 1000 [IU]
  Filled 2022-12-26: qty 4

## 2022-12-26 MED ORDER — HEPARIN SODIUM (PORCINE) 1000 UNIT/ML IJ SOLN
2000.0000 [IU] | Freq: Once | INTRAMUSCULAR | Status: AC
  Administered 2022-12-26: 2000 [IU] via INTRAVENOUS

## 2022-12-26 MED ORDER — HEPARIN SODIUM (PORCINE) 1000 UNIT/ML IJ SOLN
INTRAMUSCULAR | Status: AC
  Filled 2022-12-26: qty 2

## 2022-12-26 NOTE — Progress Notes (Signed)
[facility] Progress Note   Subjective:  Seen in dialysis.  Seems a little confused this AM.  No real complaints-   Objective Vitals:   12/26/22 0901 12/26/22 0930 12/26/22 1003 12/26/22 1036  BP: (!) 152/54 (!) 123/56 (!) 125/56 (!) 126/58  Pulse: 62 60 73 67  Resp: 16 18  16   Temp:      TempSrc:      SpO2: 97% 97% 97% 98%  Weight:      Height:       Physical Exam General: Chronically frail appearing woman, NAD, awake.  Confused about where she is- [facility] hospital HD unit.   Heart: RRR; no murmur Lungs: CTA anteriorly Abdomen: soft Extremities: no LE edema Dialysis Access: [facility]  Additional Objective Labs: Basic Metabolic Panel: Recent Labs  Lab 12/23/22 1124 12/24/22 0045 12/25/22 0105 12/26/22 0126  NA 131* 134* 129* 128*  K 4.3 3.8 3.9 4.1  CL 94* 96* 93* 90*  CO2 23 26 25 26   GLUCOSE 80 80 102* 114*  BUN 33* 13 25* 39*  CREATININE 7.35* 3.63* 5.54* 7.33*  CALCIUM 7.9* 8.3* 8.3* 8.6*  PHOS 4.4  --   --   --    Liver Function Tests: Recent Labs  Lab 12/23/22 1124  ALBUMIN 2.3*   CBC: Recent Labs  Lab 12/21/22 0034 12/22/22 0010 12/24/22 0045 12/25/22 0105 12/26/22 0126  WBC 8.9 6.8 5.9 9.2 10.2  HGB 6.6* 9.4* 9.5* 9.3* 9.5*  HCT 20.0* 28.8* 29.5* 29.0* 29.2*  MCV 111.1* 99.7 103.5* 104.7* 103.5*  PLT 231 197 188 203 229   Blood Culture    Component Value Date/Time   SDES BLOOD RIGHT HAND 12/22/2022 1758   SDES BLOOD RIGHT HAND 12/22/2022 1758   SPECREQUEST  12/22/2022 1758    BOTTLES DRAWN AEROBIC AND ANAEROBIC Blood Culture adequate volume   SPECREQUEST  12/22/2022 1758    BOTTLES DRAWN AEROBIC AND ANAEROBIC Blood Culture adequate volume   CULT  12/22/2022 1758    NO GROWTH 4 DAYS Performed at [facility], [address] [place], [place] [address]    CULT  12/22/2022 1758    NO GROWTH 4 DAYS Performed at [facility], [address] [place], [place] [address]    REPTSTATUS PENDING 12/22/2022 1758    REPTSTATUS PENDING 12/22/2022 1758   Medications:   amiodarone  200 mg Oral Daily   atorvastatin  10 mg Oral QHS   calcitRIOL  1.75 mcg Oral Q M,W,F-HD   Chlorhexidine Gluconate Cloth  6 each Topical Q0600   cinacalcet  30 mg Oral Q M,W,F-HD   darbepoetin (ARANESP) injection - DIALYSIS  150 mcg Subcutaneous Q Mon-1800   gabapentin  100 mg Oral QPM   heparin sodium (porcine)       hydrALAZINE  25 mg Oral BID   levETIRAcetam  500 mg Oral Daily   levothyroxine  75 mcg Oral QAC breakfast   metoprolol tartrate  25 mg Oral BID    Dialysis Orders: MWF at [facility] EDW 58.5kg TDC 2K 2.5Ca BFR 300 DFR 500 Calcitrol 1.57mcg PO q HD Sensipar 30mg  PO q HD Mircera 200 mcg IV q 2 weeks- Last dose 11/28/22   Assessment/Plan: 1.  Paroxysmal Afib: Cardiology initially involved, on amiodarone + metoprolol, no AC due to Hx GIB.   2.  Metapneumovirus: Fevers resolved. Management per PMD.  3. Shortness of breath: Improved s/p serial HD for volume removal. Now well under her prior dry weight,  needs to be lowered on discharge. 4.  ESRD: Continue HD on MWF schedule. Clots HD circuit without small amount of heparin. Next 6/17, today 5.  Secondary hyperparathyroidism: CorrCa and Phos to goal. Renvela on hold. Continue calcitriol and Sensipar q HD for now. 6.  Hypertension: BP low 6/15. On metoprolol 25mg  BID and hydralazine 50mg  TID -> reduced hydral to 25mg  BID with holding parameters -> better today. 7. Anemia of ESRD: Hgb down to 6.6 on 6/12, s/p 1U PRBCs. Hgb 9.3. Last tsat 88% - no iron needed.  Aranesp given q Monday here (last 6/10). 8. Dispo: Anticipating SNF placement. Palliative care consulted, still full scope for now.     Bufford Buttner MD 12/26/2022, 10:57 AM  [facility]

## 2022-12-26 NOTE — Progress Notes (Signed)
Occupational Therapy Treatment Patient Details Name: Shawna [name] MRN: [id number] DOB: 1940-10-13 Today's Date: 12/26/2022   History of present illness 82 yo female presenting 12/14/22 from [facility] due to CP after HD. Admitted with atrial tachycardia. Recently admitted 11/30/22-12/13/22 for rectal bleed. PMH ESRD on HD MWF, seizure disorder, afib, HTN, HLD, CAD s/p CABG and PCI, hypothyroidism, , Small bowel AVMS, GIB, Hx of colon CA, TIA, PUD, Gout.   OT comments  Pt with slow progression towards goals, limited by fatigue/lethargy after having HD this AM and working with PT. Pt able to compete seated ADL tasks with mod-max A. Declines standing/transfer attempts at this time due to fatigue. Pt presenting with impairments listed below, will follow acutely. Patient will benefit from continued inpatient follow up therapy, <3 hours/day to maximize safety/ind with ADLs/functional mobility.    Recommendations for follow up therapy are one component of a multi-disciplinary discharge planning process, led by the attending physician.  Recommendations may be updated based on patient status, additional functional criteria and insurance authorization.    Assistance Recommended at Discharge Frequent or constant Supervision/Assistance  Patient can return home with the following  Assistance with cooking/housework;Direct supervision/assist for medications management;Direct supervision/assist for financial management;Assist for transportation;Help with stairs or ramp for entrance;A little help with walking and/or transfers;A little help with bathing/dressing/bathroom;A lot of help with bathing/dressing/bathroom   Equipment Recommendations  Other (comment) (defer)    Recommendations for Other Services      Precautions / Restrictions Precautions Precautions: Fall Precaution Comments: watch HR Restrictions Weight Bearing Restrictions: No       Mobility Bed Mobility               General bed  mobility comments: OOB in chair upon arrival and departure    Transfers                   General transfer comment: pt declines secondary to fatigue, remained in chair throughout session     Balance Overall balance assessment: Needs assistance Sitting-balance support: Feet supported Sitting balance-Leahy Scale: Fair                                     ADL either performed or assessed with clinical judgement   ADL Overall ADL's : Needs assistance/impaired Eating/Feeding: Set up;Sitting   Grooming: Moderate assistance;Sitting Grooming Details (indicate cue type and reason): washing food off of face and chest         Upper Body Dressing : Maximal assistance;Sitting Upper Body Dressing Details (indicate cue type and reason): donning clean gown Lower Body Dressing: Maximal assistance Lower Body Dressing Details (indicate cue type and reason): able to reach down towards feet, unable to reach far enough to pull up socks                    Extremity/Trunk Assessment Upper Extremity Assessment Upper Extremity Assessment: Generalized weakness   Lower Extremity Assessment Lower Extremity Assessment: Defer to PT evaluation        Vision   Vision Assessment?: No apparent visual deficits   Perception Perception Perception: Not tested   Praxis Praxis Praxis: Not tested    Cognition Arousal/Alertness: Lethargic Behavior During Therapy: Flat affect Overall Cognitive Status: No family/caregiver present to determine baseline cognitive functioning Area of Impairment: Orientation, Memory, Following commands, Safety/judgement, Problem solving  Orientation Level: Disoriented to, Place, Time, Situation   Memory: Decreased short-term memory, Decreased recall of precautions Following Commands: Follows one step commands with increased time Safety/Judgement: Decreased awareness of safety, Decreased awareness of deficits Awareness:  Intellectual Problem Solving: Slow processing, Decreased initiation, Difficulty sequencing, Requires verbal cues, Requires tactile cues General Comments: reports she "cannot walk good", recalls going to HD earlier today        Exercises      Shoulder Instructions       General Comments      Pertinent Vitals/ Pain       Pain Assessment Pain Assessment: No/denies pain  Home Living                                          Prior Functioning/Environment              Frequency  Min 1X/week        Progress Toward Goals  OT Goals(current goals can now be found in the care plan section)  Progress towards OT goals: Progressing toward goals  Acute Rehab OT Goals Patient Stated Goal: to rest OT Goal Formulation: With patient Time For Goal Achievement: 12/31/22 Potential to Achieve Goals: Fair ADL Goals Pt Will Perform Grooming: with supervision;standing Pt Will Perform Upper Body Dressing: with modified independence;sitting Pt Will Perform Lower Body Dressing: with supervision;sit to/from stand Pt Will Transfer to Toilet: with supervision;ambulating;regular height toilet;bedside commode  Plan Discharge plan remains appropriate    Co-evaluation                 AM-PAC OT "6 Clicks" Daily Activity     Outcome Measure   Help from another person eating meals?: A Little Help from another person taking care of personal grooming?: A Lot Help from another person toileting, which includes using toliet, bedpan, or urinal?: A Lot Help from another person bathing (including washing, rinsing, drying)?: A Lot Help from another person to put on and taking off regular upper body clothing?: A Lot Help from another person to put on and taking off regular lower body clothing?: A Lot 6 Click Score: 13    End of Session    OT Visit Diagnosis: Unsteadiness on feet (R26.81);Other abnormalities of gait and mobility (R26.89);Muscle weakness (generalized)  (M62.81)   Activity Tolerance Patient limited by fatigue;Patient limited by lethargy   Patient Left in chair;with call bell/phone within reach;with chair alarm set   Nurse Communication Mobility status        Time: 1411-1434 OT Time Calculation (min): 23 min  Charges: OT General Charges $OT Visit: 1 Visit OT Treatments $Self Care/Home Management : 23-37 mins  Carver Fila, OTD, OTR/L SecureChat Preferred Acute Rehab [phone]   Carver Fila Koonce 12/26/2022, 2:49 PM

## 2022-12-26 NOTE — TOC Progression Note (Signed)
Transition of Care [facility]) - Progression Note    Patient Details  Name: Shawna [name] MRN: [id number] Date of Birth: 11-Sep-1940  [facility] CM/SW Contact  Leander Rams, LCSW Phone Number: 12/26/2022, 10:49 AM  Clinical Narrative:    CSW was informed that pt is medically ready for dc. Pt auth for SNF expired last week. Pt will need up to date clinicals to be faxed for a new auth. Pt is currently in HD. PT will see pt when she returns to the unit.   TOC will continue to follow.        Expected Discharge Plan and Services         Expected Discharge Date: 12/19/22                                     Social Determinants of Health (SDOH) Interventions SDOH Screenings   Food Insecurity: No Food Insecurity (12/22/2022)  Housing: Low Risk  (12/22/2022)  Transportation Needs: No Transportation Needs (12/22/2022)  Utilities: Not At Risk (12/22/2022)  Financial Resource Strain: Low Risk  (12/14/2018)  Physical Activity: Unknown (12/14/2018)  Social Connections: Unknown (12/14/2018)  Stress: No Stress Concern Present (12/14/2018)  Tobacco Use: Low Risk  (12/14/2022)    Readmission Risk Interventions    10/31/2022   12:45 PM 08/29/2022    4:47 PM 11/12/2021    8:56 AM  Readmission Risk Prevention Plan  Transportation Screening Complete Complete Complete  Medication Review Oceanographer) Complete Complete Complete  PCP or Specialist appointment within 3-5 days of discharge  Complete Complete  HRI or Home Care Consult Complete Complete Complete  SW Recovery Care/Counseling Consult Complete Complete Complete  Palliative Care Screening Not Applicable Not Applicable Not Applicable  Skilled Nursing Facility Not Applicable Complete Complete   Oletta Lamas, MSW, LCSWA, LCASA Transitions of Care  Clinical Social Worker I

## 2022-12-26 NOTE — Progress Notes (Signed)
Physical Therapy Treatment Patient Details Name: Shawna [name] MRN: [id number] DOB: 1941/05/21 Today's Date: 12/26/2022   History of Present Illness 83 yo female presenting 12/14/22 from [facility] due to CP after HD. Admitted with atrial tachycardia. Recently admitted 11/30/22-12/13/22 for rectal bleed. PMH ESRD on HD MWF, seizure disorder, afib, HTN, HLD, CAD s/p CABG and PCI, hypothyroidism, , Small bowel AVMS, GIB, Hx of colon CA, TIA, PUD, Gout.    PT Comments    Pt supine in bed just back from dialysis.  She is alert to herself only.  Pt showing progress this session and able to progress with RW and steps to recliner.  Pt continues to benefit from skilled rehab in a post acute setting.     Recommendations for follow up therapy are one component of a multi-disciplinary discharge planning process, led by the attending physician.  Recommendations may be updated based on patient status, additional functional criteria and insurance authorization.  Follow Up Recommendations  Can patient physically be transported by private vehicle: No    Assistance Recommended at Discharge Frequent or constant Supervision/Assistance  Patient can return home with the following Assistance with cooking/housework;Help with stairs or ramp for entrance;Assist for transportation;Direct supervision/assist for medications management;Direct supervision/assist for financial management;A lot of help with walking and/or transfers;A lot of help with bathing/dressing/bathroom   Equipment Recommendations  None recommended by PT    Recommendations for Other Services       Precautions / Restrictions Precautions Precautions: Fall Precaution Comments: watch HR Restrictions Weight Bearing Restrictions: No     Mobility  Bed Mobility Overal bed mobility: Needs Assistance Bed Mobility: Supine to Sit     Supine to sit: Mod assist, HOB elevated     General bed mobility comments: Assist to elevate trunk and bring hips  to EOB.    Transfers Overall transfer level: Needs assistance Equipment used: Rolling walker (2 wheels) Transfers: Sit to/from Stand Sit to Stand: Mod assist   Step pivot transfers: Mod assist       General transfer comment: Cues for hand placement to and from seated surface.  Presents with strong posterior bias this session with cues for posture and weight shifting forward.    Ambulation/Gait Ambulation/Gait assistance: Mod assist Gait Distance (Feet): 4 Feet Assistive device: Rolling walker (2 wheels) Gait Pattern/deviations: Step-through pattern, Decreased stride length, Trunk flexed, Shuffle, Leaning posteriorly Gait velocity: reduced     General Gait Details: performed steps with RW from bed short distance to recliner with turns and backing.   Stairs             Wheelchair Mobility    Modified Rankin (Stroke Patients Only)       Balance Overall balance assessment: Needs assistance   Sitting balance-Leahy Scale: Poor       Standing balance-Leahy Scale: Poor                              Cognition Arousal/Alertness: Lethargic Behavior During Therapy: Flat affect Overall Cognitive Status: No family/caregiver present to determine baseline cognitive functioning Area of Impairment: Orientation, Memory, Following commands, Safety/judgement, Problem solving                 Orientation Level: Disoriented to, Place, Time, Situation   Memory: Decreased short-term memory, Decreased recall of precautions Following Commands: Follows one step commands with increased time Safety/Judgement: Decreased awareness of safety, Decreased awareness of deficits   Problem Solving: Slow processing, Decreased  initiation, Difficulty sequencing, Requires verbal cues, Requires tactile cues General Comments: Pt with baseline cognitive deficits, reports she is a [place] and needs me to pull the car around to take her to .        Exercises       General Comments        Pertinent Vitals/Pain Pain Assessment Pain Assessment: Faces Faces Pain Scale: No hurt    Home Living                          Prior Function            PT Goals (current goals can now be found in the care plan section) Acute Rehab PT Goals Patient Stated Goal: not stated, pt very confused Potential to Achieve Goals: Good Progress towards PT goals: Progressing toward goals    Frequency    Min 1X/week      PT Plan Current plan remains appropriate    Co-evaluation              AM-PAC PT "6 Clicks" Mobility   Outcome Measure  Help needed turning from your back to your side while in a flat bed without using bedrails?: A Lot Help needed moving from lying on your back to sitting on the side of a flat bed without using bedrails?: A Lot Help needed moving to and from a bed to a chair (including a wheelchair)?: A Lot Help needed standing up from a chair using your arms (e.g., wheelchair or bedside chair)?: A Lot Help needed to walk in hospital room?: A Lot Help needed climbing 3-5 steps with a railing? : Total 6 Click Score: 11    End of Session Equipment Utilized During Treatment: Gait belt Activity Tolerance: Patient limited by fatigue Patient left: with call bell/phone within reach;in chair;with chair alarm set (lunch tray set up.) Nurse Communication: Mobility status PT Visit Diagnosis: Other abnormalities of gait and mobility (R26.89);Muscle weakness (generalized) (M62.81);Difficulty in walking, not elsewhere classified (R26.2);Unsteadiness on feet (R26.81)     Time: 8295-6213 PT Time Calculation (min) (ACUTE ONLY): 16 min  Charges:  $Therapeutic Activity: 8-22 mins                     Bonney Leitz , PTA Acute Rehabilitation Services Office [phone]    Charlyne Robertshaw Artis Delay 12/26/2022, 1:56 PM

## 2022-12-26 NOTE — Plan of Care (Signed)

## 2022-12-26 NOTE — Progress Notes (Addendum)
PT Cancellation Note  Patient Details Name: Shawna [name] MRN: [id number] DOB: 1941/06/09   Cancelled Treatment:    Reason Eval/Treat Not Completed: (P) Other (comment) (Pt off unit for dialysis will f/u per POC.)   Marleta Lapierre J Aundria Rud 12/26/2022, 11:38 AM  Bonney Leitz , PTA Acute Rehabilitation Services Office [phone]

## 2022-12-26 NOTE — Procedures (Signed)
Patient seen and examined on Hemodialysis. The procedure was supervised and I have made appropriate changes. BP (!) 126/58   Pulse 67   Temp 98.1 F (36.7 C)   Resp 16   Ht 5\' 2"  (1.575 m)   Wt 51 kg   SpO2 98%   BMI 20.56 kg/m   QB 400 mL/ min via AVF, UF goal 2L  Tolerating treatment without complaints at this time.   Bufford Buttner MD [facility] [phone] 10:59 AM

## 2022-12-26 NOTE — Progress Notes (Signed)
PROGRESS NOTE Shawna [name]  ZOX:[id number] DOB: 06/05/1941 DOA: 12/14/2022 PCP: [facility]  Brief Narrative/Hospital Course: 82 year old with past medical history significant for hypertension, hyperlipidemia, CAD status post CABG and PCI, paroxysmal A-fib, no longer on anticoagulation due to recurrent GI bleed, ESRD on hemodialysis (MWF), seizure disorder, hypothyroidism, history of pulmonary. Presented to the ED complaining of chest pain while she was returning from dialysis the day of admission. Reports chest pain sharp in nature, 10 out of 10 associated with palpitation. Patient admission to [facility]/due to AVM, received blood transfusion and was treated with argon plasma hospitalization. Developed paroxysmal Atrial flutter which was difficult to control she was placed on amiodarone and Coreg.  Seen by cardiology now on metoprolol, amiodarone.  Patient not on anticoagulation due to prior history of GI bleeding. ? Of Wide complex tachycardia, axis no change this is related to her left bundle branch block suspicious for atrial tachycardia versus atrial flutter- seen by cardiology Complicated hospitalization due to acute pulmonary edema from fluid overload needing DAILY HD Patient had lethargy acute metabolic encephalopathy with multiple comorbidities, She also tested positive for metapneumovirus, blood culture 6/13 done for fever was no growth so far    Subjective: Patient seen and examined this morning  She was sleeping did not wake up able to answer questions appropriately  Teach arrived to take her to HD  Assessment and Plan: Principal Problem:   Paroxysmal atrial flutter (HCC) Active Problems:   Macrocytic anemia   End-stage renal disease on hemodialysis (HCC)   Essential hypertension   Diabetes mellitus type 2 in nonobese [facility])   Acquired hypothyroidism   Mixed hyperlipidemia   Wide-complex tachycardia  Metapneumovirus infection: Patient had fever 6/13 workup showed  metapneumovirus infection+- otherwise negative COVID,blood culture  ngtd. CXR no pneumonia but small left pleural effusion with improved left lung base ventilation.  No more fever spike since 6/13 but still with generalized weakness  Atrial tachycardia/paroxysmal atrial fibrillation/flutter/?WCT: axis had no changes so per cardio WCT is related to her left bundle branch block likely from atrial tachycardia versus atrial flutter that showed as WCT.  Cont rhythm control strategy with amiodarone,metoprolol. She is not on anticoagulation due to history of GI bleed.   Chronic systolic CHF with EF 30-35% CAD status post CABG with residual small vessel disease Chest pain due to demand ischemia: known history of CABG vein grafts are patent however has residual small vessel disease that is being managed medically suspect her angina was related to tachycardia.Not on aspirin due to history of GI bleeding Cardiology consulted, signed off,.  Going for HD today  Acute pulmonary edema/volume overload ESRD on HD MWF: At this time fluid overload has improved continue regular HD per nephrology.S/p serial HD on 6/ 11, 6/12, 6/13 due to fluid overload and over improved, dry weight has been decreased  AoCKD/Anemia of chronic renal disease: hb dropped to 6.6 g s/p 1 unit PRBC with HD 6/12> hb improved appropriately holding in 9 g range stable. Previous transfusion was on 5/22.She has history of GI bleed but denies dark stool or any bleeding.Continue weekly ESA,  Acute metabolic encephalopathy Lethargy-confusion weakness deconditioning and debility: This is in the setting of patient's metapneumovirus infection, fluid overload number comorbidities.  Much more alert awake but still weak and deconditioned will need placement  Secondary hyperparathyroidism Phos at goal holding sevelamer for now. Continue calcitriol and Sensipar 3x weekly per nephrology    Hypertension: BP was soft Meds adjusted decreasing dose of  hydralazine  continue metoprolol.Appreciate nephrology management  Hyperlipidemia:Continue statin  Type diabetes mellitus diet controlled-CBG STABLE.  Acquired hypothyroidism on levothyroxine.  GOC: She is weak and frail, high risk for readmission, decompensation  with chronic systolic CHF with CAD currently on dialysis dependent now with meta pneumoviral infection. I had called and updated patient's daughter 6/11.Palliative care has been consulted and following closely appreciate input, remains full code.    DVT prophylaxis: SCDs Start: 12/14/22 2155 Code Status:   Code Status: Full Code Family Communication: plan of care discussed with patient at bedside. I called to updated her daughter 6/14, no answer-previously called and updated her husband as well.   Patient status is: Inpatient because of shortness of breath, arteriosclerosis of PRN Level of care: Telemetry Medical   Dispo: The patient is from: home with husband, but currently in SNF since last week            Anticipated disposition: Awaiting for insurance authorization for SNF.  She is medically stable   Objective: Vitals last 24 hrs: Vitals:   12/26/22 0855 12/26/22 0857 12/26/22 0901 12/26/22 0930  BP: (!) 154/78 (!) 158/55 (!) 152/54 (!) 123/56  Pulse: 60 (!) 58 62 60  Resp: 18 15 16 18   Temp: 98.1 F (36.7 C)     TempSrc:      SpO2: 96% 95% 97% 97%  Weight:      Height:       Weight change: -0.3 kg  Physical Examination: General exam: AA, weak,older appearing HEENT:Oral mucosa moist, Ear/Nose WNL grossly, dentition normal. Respiratory system: bilaterally clear BS, no use of accessory muscle. Chest TDC+ Cardiovascular system: S1 & S2 +, regular rate. Gastrointestinal system: Abdomen soft, NT,ND,BS+ Nervous System:Alert, awake, moving extremities and grossly nonfocal Extremities: LE ankle edema neg, lower extremities warm Skin: No rashes,no icterus. MSK: weak think muscle bulk,tone, power   Medications  reviewed:  Scheduled Meds:  amiodarone  200 mg Oral Daily   atorvastatin  10 mg Oral QHS   calcitRIOL  1.75 mcg Oral Q M,W,F-HD   Chlorhexidine Gluconate Cloth  6 each Topical Q0600   cinacalcet  30 mg Oral Q M,W,F-HD   darbepoetin (ARANESP) injection - DIALYSIS  150 mcg Subcutaneous Q Mon-1800   gabapentin  100 mg Oral QPM   heparin sodium (porcine)       hydrALAZINE  25 mg Oral BID   levETIRAcetam  500 mg Oral Daily   levothyroxine  75 mcg Oral QAC breakfast   metoprolol tartrate  25 mg Oral BID  Continuous Infusions:  Diet Order             Diet heart healthy/carb modified Room service appropriate? Yes; Fluid consistency: Thin  Diet effective now                  Intake/Output Summary (Last 24 hours) at 12/26/2022 0959 Last data filed at 12/26/2022 1610 Gross per 24 hour  Intake 149.45 ml  Output 2 ml  Net 147.45 ml    Net IO Since Admission: -8,676.65 mL [12/26/22 0959]  Wt Readings from Last 3 Encounters:  12/26/22 51 kg  12/12/22 56.9 kg  11/18/22 59.4 kg     Unresulted Labs (From admission, onward)     Start     Ordered   12/24/22 0500  Basic metabolic panel  Daily,   R     Question:  Specimen collection method  Answer:  Lab=Lab collect   12/23/22 1004   12/24/22 0500  CBC  Daily,  R     Question:  Specimen collection method  Answer:  Lab=Lab collect   12/23/22 1004   Signed and Held  Renal function panel  Once,   R       Question:  Specimen collection method  Answer:  Lab=Lab collect   Signed and Held   Signed and Held  CBC  Once,   R       Question:  Specimen collection method  Answer:  Lab=Lab collect   Signed and Held          Data Reviewed: I have personally reviewed following labs and imaging studies CBC: Recent Labs  Lab 12/21/22 0034 12/22/22 0010 12/24/22 0045 12/25/22 0105 12/26/22 0126  WBC 8.9 6.8 5.9 9.2 10.2  HGB 6.6* 9.4* 9.5* 9.3* 9.5*  HCT 20.0* 28.8* 29.5* 29.0* 29.2*  MCV 111.1* 99.7 103.5* 104.7* 103.5*  PLT 231 197  188 203 229   Basic Metabolic Panel: Recent Labs  Lab 12/22/22 0010 12/23/22 1124 12/24/22 0045 12/25/22 0105 12/26/22 0126  NA 133* 131* 134* 129* 128*  K 3.9 4.3 3.8 3.9 4.1  CL 94* 94* 96* 93* 90*  CO2 26 23 26 25 26   GLUCOSE 93 80 80 102* 114*  BUN 14 33* 13 25* 39*  CREATININE 4.15* 7.35* 3.63* 5.54* 7.33*  CALCIUM 8.0* 7.9* 8.3* 8.3* 8.6*  PHOS  --  4.4  --   --   --    [id number] Creatinine Clearance: 4.7 mL/min (A) (by C-G formula based on SCr of 7.33 mg/dL (H)). Liver Function Tests: Recent Labs  Lab 12/23/22 1124  ALBUMIN 2.3*    Recent Results (from the past 240 hour(s))  Respiratory (~20 pathogens) panel by PCR     Status: Abnormal   Collection Time: 12/22/22  8:32 AM   Specimen: Nasopharyngeal Swab; Respiratory  Result Value Ref Range Status   Adenovirus NOT DETECTED NOT DETECTED Final   Coronavirus 229E NOT DETECTED NOT DETECTED Final    Comment: (NOTE) The Coronavirus on the Respiratory Panel, DOES NOT test for the novel  Coronavirus (2019 nCoV)    Coronavirus HKU1 NOT DETECTED NOT DETECTED Final   Coronavirus NL63 NOT DETECTED NOT DETECTED Final   Coronavirus OC43 NOT DETECTED NOT DETECTED Final   Metapneumovirus DETECTED (A) NOT DETECTED Final   Rhinovirus / Enterovirus NOT DETECTED NOT DETECTED Final   Influenza A NOT DETECTED NOT DETECTED Final   Influenza B NOT DETECTED NOT DETECTED Final   Parainfluenza Virus 1 NOT DETECTED NOT DETECTED Final   Parainfluenza Virus 2 NOT DETECTED NOT DETECTED Final   Parainfluenza Virus 3 NOT DETECTED NOT DETECTED Final   Parainfluenza Virus 4 NOT DETECTED NOT DETECTED Final   Respiratory Syncytial Virus NOT DETECTED NOT DETECTED Final   Bordetella pertussis NOT DETECTED NOT DETECTED Final   Bordetella Parapertussis NOT DETECTED NOT DETECTED Final   Chlamydophila pneumoniae NOT DETECTED NOT DETECTED Final   Mycoplasma pneumoniae NOT DETECTED NOT DETECTED Final    Comment: Performed at [facility]  Lab, [address] [place], [place] [address]  SARS Coronavirus 2 by RT PCR (hospital order, performed in [facility] hospital lab) *cepheid single result test* Anterior Nasal Swab     Status: None   Collection Time: 12/22/22  8:33 AM   Specimen: Anterior Nasal Swab  Result Value Ref Range Status   SARS Coronavirus 2 by RT PCR NEGATIVE NEGATIVE Final    Comment: Performed at [facility], [address]  [place], [place] [address]  Culture, blood (Routine X 2) w Reflex to ID Panel     Status: None (Preliminary result)   Collection Time: 12/22/22  5:58 PM   Specimen: BLOOD RIGHT HAND  Result Value Ref Range Status   Specimen Description BLOOD RIGHT HAND  Final   Special Requests   Final    BOTTLES DRAWN AEROBIC AND ANAEROBIC Blood Culture adequate volume   Culture   Final    NO GROWTH 4 DAYS Performed at [facility] Lab, [address] [place], [place] [address]    Report Status PENDING  Incomplete  Culture, blood (Routine X 2) w Reflex to ID Panel     Status: None (Preliminary result)   Collection Time: 12/22/22  5:58 PM   Specimen: BLOOD RIGHT HAND  Result Value Ref Range Status   Specimen Description BLOOD RIGHT HAND  Final   Special Requests   Final    BOTTLES DRAWN AEROBIC AND ANAEROBIC Blood Culture adequate volume   Culture   Final    NO GROWTH 4 DAYS Performed at [facility] Lab, [address], [place], [place] [address]    Report Status PENDING  Incomplete  Antimicrobials: Anti-infectives (From admission, onward)    None     Culture/Microbiology    Component Value Date/Time   SDES BLOOD RIGHT HAND 12/22/2022 1758   SDES BLOOD RIGHT HAND 12/22/2022 1758   SPECREQUEST  12/22/2022 1758    BOTTLES DRAWN AEROBIC AND ANAEROBIC Blood Culture adequate volume   SPECREQUEST  12/22/2022 1758    BOTTLES DRAWN AEROBIC AND ANAEROBIC Blood Culture adequate volume   CULT  12/22/2022 1758    NO GROWTH 4 DAYS Performed at [facility] Lab, [address],  [place], [place] [address]    CULT  12/22/2022 1758    NO GROWTH 4 DAYS Performed at [facility] Lab, [address] [place], [place] [address]    REPTSTATUS PENDING 12/22/2022 1758   REPTSTATUS PENDING 12/22/2022 1758  Radiology Studies: No results found.  LOS: 12 days  Lanae Boast, MD [facility]  12/26/2022, 9:59 AM

## 2022-12-26 NOTE — Progress Notes (Signed)
   12/26/22 1241  Vitals  Temp 97.8 F (36.6 C)  Pulse Rate 72  Resp 20  BP (!) 117/48  SpO2 100 %  O2 Device Room Air  Oxygen Therapy  Patient Activity (if Appropriate) In bed  Pulse Oximetry Type Continuous  Post Treatment  Dialyzer Clearance Clear  Duration of HD Treatment -hour(s) 3.5 hour(s)  Hemodialysis Intake (mL) 0 mL  Liters Processed 84  Fluid Removed (mL) 2000 mL  Tolerated HD Treatment Yes  Post-Hemodialysis Comments Pt. tolerated Tx without difficulties   Received patient in bed to unit.  Alert and oriented.  Informed consent signed and in chart.   TX duration:3.5  Patient tolerated well.  Transported back to the room  Alert, without acute distress.  Hand-off given to patient's nurse.   Access used: Yes Access issues: No   Total UF removed: 2000 Medication(s) given: See MAR Post HD VS: See Above Grid Post HD weight: 48.3 kg   [facility]

## 2022-12-27 ENCOUNTER — Inpatient Hospital Stay (HOSPITAL_COMMUNITY): Payer: Medicare Other

## 2022-12-27 DIAGNOSIS — I4892 Unspecified atrial flutter: Secondary | ICD-10-CM | POA: Diagnosis not present

## 2022-12-27 LAB — CBC
HCT: 28.9 % — ABNORMAL LOW (ref 36.0–46.0)
Hemoglobin: 9.3 g/dL — ABNORMAL LOW (ref 12.0–15.0)
MCH: 32.7 pg (ref 26.0–34.0)
MCHC: 32.2 g/dL (ref 30.0–36.0)
MCV: 101.8 fL — ABNORMAL HIGH (ref 80.0–100.0)
Platelets: 237 10*3/uL (ref 150–400)
RBC: 2.84 MIL/uL — ABNORMAL LOW (ref 3.87–5.11)
RDW: 23.8 % — ABNORMAL HIGH (ref 11.5–15.5)
WBC: 8.1 10*3/uL (ref 4.0–10.5)
nRBC: 0 % (ref 0.0–0.2)

## 2022-12-27 LAB — BASIC METABOLIC PANEL
Anion gap: 12 (ref 5–15)
BUN: 16 mg/dL (ref 8–23)
CO2: 23 mmol/L (ref 22–32)
Calcium: 8.5 mg/dL — ABNORMAL LOW (ref 8.9–10.3)
Chloride: 96 mmol/L — ABNORMAL LOW (ref 98–111)
Creatinine, Ser: 3.96 mg/dL — ABNORMAL HIGH (ref 0.44–1.00)
GFR, Estimated: 11 mL/min — ABNORMAL LOW (ref >60)
Glucose, Bld: 95 mg/dL (ref 70–99)
Potassium: 4.1 mmol/L (ref 3.5–5.1)
Sodium: 131 mmol/L — ABNORMAL LOW (ref 135–145)

## 2022-12-27 LAB — CULTURE, BLOOD (ROUTINE X 2)

## 2022-12-27 MED ORDER — HYDRALAZINE HCL 25 MG PO TABS: 25.0000 mg | ORAL_TABLET | Freq: Two times a day (BID) | ORAL | Status: DC

## 2022-12-27 MED ORDER — METOPROLOL TARTRATE 25 MG PO TABS: 25.0000 mg | ORAL_TABLET | Freq: Two times a day (BID) | ORAL | Status: DC

## 2022-12-27 MED ORDER — HYDRALAZINE HCL 10 MG PO TABS
10.0000 mg | ORAL_TABLET | Freq: Two times a day (BID) | ORAL | Status: DC
  Administered 2022-12-28: 10 mg via ORAL
  Filled 2022-12-27: qty 1

## 2022-12-27 NOTE — Care Management Important Message (Signed)
Important Message  Patient Details  Name: JACQUELYNE CUN MRN: [id number] Date of Birth: March 28, 1941   Medicare Important Message Given:  Yes     Sherilyn Banker 12/27/2022, 2:14 PM

## 2022-12-27 NOTE — Progress Notes (Signed)
[facility] Progress Note   Subjective:  for d/c today.  Eating breakfast.  No issues.    Objective Vitals:   12/27/22 0541 12/27/22 0611 12/27/22 0905 12/27/22 1033  BP: (!) 122/49  (!) 118/42 (!) 98/34  Pulse: 60  (!) 55 60  Resp: 16 (!) 23 19 20   Temp: 98.2 F (36.8 C)  98.2 F (36.8 C) 98 F (36.7 C)  TempSrc: Oral  Oral Oral  SpO2: 96%  97% 100%  Weight:  50.1 kg    Height:       Physical Exam General: Chronically frail appearing woman, NAD, awake.   Heart: RRR; no murmur Lungs: CTA anteriorly Abdomen: soft Extremities: no LE edema Dialysis Access: [facility]  Additional Objective Labs: Basic Metabolic Panel: Recent Labs  Lab 12/23/22 1124 12/24/22 0045 12/25/22 0105 12/26/22 0126 12/27/22 0051  NA 131*   < > 129* 128* 131*  K 4.3   < > 3.9 4.1 4.1  CL 94*   < > 93* 90* 96*  CO2 23   < > 25 26 23   GLUCOSE 80   < > 102* 114* 95  BUN 33*   < > 25* 39* 16  CREATININE 7.35*   < > 5.54* 7.33* 3.96*  CALCIUM 7.9*   < > 8.3* 8.6* 8.5*  PHOS 4.4  --   --   --   --    < > = values in this interval not displayed.   Liver Function Tests: Recent Labs  Lab 12/23/22 1124  ALBUMIN 2.3*   CBC: Recent Labs  Lab 12/22/22 0010 12/24/22 0045 12/25/22 0105 12/26/22 0126 12/27/22 0051  WBC 6.8 5.9 9.2 10.2 8.1  HGB 9.4* 9.5* 9.3* 9.5* 9.3*  HCT 28.8* 29.5* 29.0* 29.2* 28.9*  MCV 99.7 103.5* 104.7* 103.5* 101.8*  PLT 197 188 203 229 237   Blood Culture    Component Value Date/Time   SDES BLOOD RIGHT HAND 12/22/2022 1758   SDES BLOOD RIGHT HAND 12/22/2022 1758   SPECREQUEST  12/22/2022 1758    BOTTLES DRAWN AEROBIC AND ANAEROBIC Blood Culture adequate volume   SPECREQUEST  12/22/2022 1758    BOTTLES DRAWN AEROBIC AND ANAEROBIC Blood Culture adequate volume   CULT  12/22/2022 1758    NO GROWTH 5 DAYS Performed at [facility] Lab, [address], [place], [place] [address]    CULT  12/22/2022 1758    NO GROWTH 5 DAYS Performed at [facility], [address], [place], [place] [address]    REPTSTATUS 12/27/2022 FINAL 12/22/2022 1758   REPTSTATUS 12/27/2022 FINAL 12/22/2022 1758   Medications:   amiodarone  200 mg Oral Daily   atorvastatin  10 mg Oral QHS   calcitRIOL  1.75 mcg Oral Q M,W,F-HD   Chlorhexidine Gluconate Cloth  6 each Topical Q0600   cinacalcet  30 mg Oral Q M,W,F-HD   darbepoetin (ARANESP) injection - DIALYSIS  150 mcg Subcutaneous Q Mon-1800   gabapentin  100 mg Oral QPM   hydrALAZINE  25 mg Oral BID   levETIRAcetam  500 mg Oral Daily   levothyroxine  75 mcg Oral QAC breakfast   metoprolol tartrate  25 mg Oral BID    Dialysis Orders: MWF at [facility] EDW 58.5kg TDC 2K 2.5Ca BFR 300 DFR 500 Calcitrol 1.48mcg PO q HD Sensipar 30mg  PO q HD Mircera 200 mcg IV q 2 weeks- Last dose 11/28/22   Assessment/Plan: 1.  Paroxysmal Afib: Cardiology initially involved, on  amiodarone + metoprolol, no AC due to Hx GIB.   2.  Metapneumovirus: Fevers resolved. Management per PMD.  3. Shortness of breath: Improved s/p serial HD for volume removal. Now well under her prior dry weight, needs to be lowered on discharge. 4.  ESRD: Continue HD on MWF schedule. Clots HD circuit without small amount of heparin. Next 6/19 5.  Secondary hyperparathyroidism: CorrCa and Phos to goal. Renvela on hold. Continue calcitriol and Sensipar q HD for now. 6.  Hypertension: BP low 6/15. On metoprolol 25mg  BID and hydralazine 50mg  TID -> reduced hydral to 25mg  BID with holding parameters -> better today. 7. Anemia of ESRD: Hgb down to 6.6 on 6/12, s/p 1U PRBCs. Hgb 9.3. Last tsat 88% - no iron needed.  Aranesp given q Monday here (last 6/10). 8. Dispo: Anticipating SNF placement. Palliative care consulted, still full scope for now. D.c today    Bufford Buttner MD 12/27/2022, 11:47 AM  [facility]

## 2022-12-27 NOTE — Discharge Summary (Deleted)
Physician Discharge Summary  Shawna [name] UUV:[id number] DOB: June 23, 1941 DOA: 12/14/2022  PCP: [facility]  Admit date: 12/14/2022 Discharge date: 12/27/2022 Recommendations for Outpatient Follow-up:  Follow up with PCP in 1 weeks-call for appointment Please obtain BMP/CBC in one week  Discharge Dispo: SNF Discharge Condition: Stable Code Status:   Code Status: Full Code Diet recommendation:  Diet Order             Diet heart healthy/carb modified Room service appropriate? Yes; Fluid consistency: Thin  Diet effective now                    Brief/Interim Summary: 82 year old with past medical history significant for hypertension, hyperlipidemia, CAD status post CABG and PCI, paroxysmal A-fib, no longer on anticoagulation due to recurrent GI bleed, ESRD on hemodialysis (MWF), seizure disorder, hypothyroidism, history of pulmonary. Presented to the ED complaining of chest pain while she was returning from dialysis the day of admission. Reports chest pain sharp in nature, 10 out of 10 associated with palpitation. Patient admission to [facility]/due to AVM, received blood transfusion and was treated with argon plasma hospitalization. Developed paroxysmal Atrial flutter which was difficult to control she was placed on amiodarone and Coreg.  Seen by cardiology now on metoprolol, amiodarone.  Patient not on anticoagulation due to prior history of GI bleeding. ? Of Wide complex tachycardia, axis no change this is related to her left bundle branch block suspicious for atrial tachycardia versus atrial flutter- seen by cardiology Complicated hospitalization due to acute pulmonary edema from fluid overload needing DAILY HD Patient had lethargy acute metabolic encephalopathy with multiple comorbidities, She also tested positive for metapneumovirus, blood culture 6/13 done for fever was no growth so far Still deconditioned and weak but alert awake clinically improving. Waiting for skilled  nursing facility approval. Patient remains medically stable for discharge   Discharge Diagnoses:  Principal Problem:   Paroxysmal atrial flutter (HCC) Active Problems:   Macrocytic anemia   End-stage renal disease on hemodialysis (HCC)   Essential hypertension   Diabetes mellitus type 2 in nonobese [facility])   Acquired hypothyroidism   Mixed hyperlipidemia   Wide-complex tachycardia   Metapneumovirus infection: Patient had fever 6/13 workup showed metapneumovirus infection+- otherwise negative COVID,blood culture  ngtd. CXR no pneumonia but small left pleural effusion with improved left lung base ventilation.  No more fever spike since 6/13 but still with generalized weakness so will benefit with rehab placement  Atrial tachycardia/paroxysmal atrial fibrillation/flutter/?WCT: axis had no changes so per cardio WCT is related to her left bundle branch block likely from atrial tachycardia versus atrial flutter that showed as WCT. Cont rhythm control strategy with amiodarone,metoprolol-holding for heart rate less than 60 or SBP less than 110. She is not on anticoagulation due to history of GI bleed.    Chronic systolic CHF with EF 30-35% CAD status post CABG with residual small vessel disease Chest pain due to demand ischemia: known history of CABG vein grafts are patent however has residual small vessel disease that is being managed medically suspect her angina was related to tachycardia.Not on aspirin due to history of GI bleeding Cardiology consulted, signed off,.  Going for HD today  Acute pulmonary edema/volume overload ESRD on HD MWF: At this time fluid overload has improved continue regular HD per nephrology.S/p serial HD on 6/ 11, 6/12, 6/13 due to fluid overload and over improved, dry weight has been decreased  AoCKD/Anemia of chronic renal disease: hb dropped to 6.6 g  s/p 1 unit PRBC with HD 6/12> hb improved appropriately holding in 9 g range stable. Previous transfusion was on  5/22.She has history of GI bleed but denies dark stool or any bleeding.Continue weekly ESA,  Acute metabolic encephalopathy Lethargy-confusion weakness deconditioning and debility: This is in the setting of patient's metapneumovirus infection, fluid overload number comorbidities.  She is very alert awake feeding herself today agreeable for discharge to skilled nursing facility  Secondary hyperparathyroidism Phos at goal holding sevelamer for now. Continue calcitriol and Sensipar 3x weekly per nephrology    Hypertension: BP was soft Meds adjusted decreasing dose of hydralazine-hold if blood pressure less than 110s systolic, continue metoprolol.Appreciate nephrology management  Hyperlipidemia:Continue statin  Type diabetes mellitus diet controlled-CBG STABLE.  Acquired hypothyroidism on levothyroxine.  GOC: She is weak and frail, high risk for readmission, decompensation  with chronic systolic CHF with CAD currently on dialysis dependent now with meta pneumoviral infection. I had called and updated patient's daughter 6/11.Palliative care has been consulted and following closely appreciate input, remains full code.   Consults: Nephrology , palliative medicine Subjective: Patient is alert awake and talkative today on self-feeding She did not recollect much of the conversation we had before  Discharge Exam: Vitals:   12/27/22 0611 12/27/22 0905  BP:  (!) 118/42  Pulse:  (!) 55  Resp: (!) 23 19  Temp:  98.2 F (36.8 C)  SpO2:  97%   General: Pt is alert, awake, not in acute distress Cardiovascular: RRR, S1/S2 +, no rubs, no gallops Respiratory: CTA bilaterally, no wheezing, no rhonchi,chest TDC+ Abdominal: Soft, NT, ND, bowel sounds + Extremities: no edema, no cyanosis  Discharge Instructions  Discharge Instructions     (HEART FAILURE PATIENTS) Call MD:  Anytime you have any of the following symptoms: 1) 3 pound weight gain in 24 hours or 5 pounds in 1 week 2) shortness of  breath, with or without a dry hacking cough 3) swelling in the hands, feet or stomach 4) if you have to sleep on extra pillows at night in order to breathe.   Complete by: As directed    Discharge instructions   Complete by: As directed    Please call call MD or return to ER for similar or worsening recurring problem that brought you to hospital or if any fever,nausea/vomiting,abdominal pain, uncontrolled pain, chest pain,  shortness of breath or any other alarming symptoms.  Please follow-up your doctor as instructed in a week time and call the office for appointment.  Please avoid alcohol, smoking, or any other illicit substance and maintain healthy habits including taking your regular medications as prescribed.  You were cared for by a hospitalist during your hospital stay. If you have any questions about your discharge medications or the care you received while you were in the hospital after you are discharged, you can call the unit and ask to speak with the hospitalist on call if the hospitalist that took care of you is not available.  Once you are discharged, your primary care physician will handle any further medical issues. Please note that NO REFILLS for any discharge medications will be authorized once you are discharged, as it is imperative that you return to your primary care physician (or establish a relationship with a primary care physician if you do not have one) for your aftercare needs so that they can reassess your need for medications and monitor your lab values   Discharge instructions   Complete by: As directed  Please call call MD or return to ER for similar or worsening recurring problem that brought you to hospital or if any fever,nausea/vomiting,abdominal pain, uncontrolled pain, chest pain,  shortness of breath or any other alarming symptoms.  Please follow-up your doctor as instructed in a week time and call the office for appointment.  Please avoid alcohol, smoking,  or any other illicit substance and maintain healthy habits including taking your regular medications as prescribed.  You were cared for by a hospitalist during your hospital stay. If you have any questions about your discharge medications or the care you received while you were in the hospital after you are discharged, you can call the unit and ask to speak with the hospitalist on call if the hospitalist that took care of you is not available.  Once you are discharged, your primary care physician will handle any further medical issues. Please note that NO REFILLS for any discharge medications will be authorized once you are discharged, as it is imperative that you return to your primary care physician (or establish a relationship with a primary care physician if you do not have one) for your aftercare needs so that they can reassess your need for medications and monitor your lab values   Increase activity slowly   Complete by: As directed    Increase activity slowly   Complete by: As directed       Allergies as of 12/27/2022       Reactions   Amlodipine Swelling   Aspirin Other (See Comments)   High Doses Mess up her stomach; "makes my bowels have blood in them". Takes 81 mg EC Aspirin    Nitrofurantoin Hives   Ranexa [ranolazine] Other (See Comments)   Myoclonus-hospitalized    Bactrim [sulfamethoxazole-trimethoprim] Rash   Contrast Media [iodinated Contrast Media] Itching   Iron Itching, Other (See Comments)   "they gave me iron in dialysis; had to give me Benadryl cause I had to have the iron" (05/02/2012)   Gabapentin Other (See Comments)   Unknown reaction   Iron Sucrose Other (See Comments)   Unknown   Sucroferric Oxyhydroxide Other (See Comments)   Unknown   Levaquin [levofloxacin In D5w] Rash   Pantoprazole Rash   Plavix [clopidogrel Bisulfate] Rash   Protonix [pantoprazole Sodium] Rash   Venofer [ferric Oxide] Itching, Other (See Comments)   Patient reports using Benadryl  prior to doses as [facility]        Medication List     STOP taking these medications    atorvastatin 10 MG tablet Commonly known as: LIPITOR   sevelamer carbonate 800 MG tablet Commonly known as: RENVELA       TAKE these medications    ALPRAZolam 0.5 MG tablet Commonly known as: XANAX Take 1 tablet (0.5 mg total) by mouth daily as needed for up to 4 doses for anxiety.   amiodarone 200 MG tablet Commonly known as: PACERONE Take 200 mg by mouth daily. What changed: Another medication with the same name was removed. Continue taking this medication, and follow the directions you see here.   benzonatate 100 MG capsule Commonly known as: TESSALON Take 100 mg by mouth 3 (three) times daily as needed for cough.   calcitRIOL 0.25 MCG capsule Commonly known as: ROCALTROL Take 7 capsules (1.75 mcg total) by mouth every Monday, Wednesday, and Friday with hemodialysis.   cinacalcet 30 MG tablet Commonly known as: SENSIPAR Take 1 tablet (30 mg total) by mouth every Monday, Wednesday, and  Friday with hemodialysis.   cyclobenzaprine 5 MG tablet Commonly known as: FLEXERIL Take 5 mg by mouth 3 (three) times daily as needed.   folic acid 1 MG tablet Commonly known as: FOLVITE Take 1 mg by mouth daily.   gabapentin 100 MG capsule Commonly known as: NEURONTIN Take 1 capsule (100 mg total) by mouth every evening.   guaiFENesin-codeine 100-10 MG/5ML syrup Take 5 mLs by mouth every 4 (four) hours as needed for cough.   hydrALAZINE 25 MG tablet Commonly known as: APRESOLINE Take 1 tablet (25 mg total) by mouth 2 (two) times daily. Hold for sbp <110 What changed:  medication strength how much to take when to take this additional instructions   hydrOXYzine 25 MG tablet Commonly known as: ATARAX Take 25 mg by mouth every 6 (six) hours as needed for itching.   levETIRAcetam 500 MG tablet Commonly known as: KEPPRA Take 1 tablet (500 mg total) by mouth daily.    levothyroxine 75 MCG tablet Commonly known as: SYNTHROID Take 1 tablet (75 mcg total) by mouth daily before breakfast.   metoprolol tartrate 25 MG tablet Commonly known as: LOPRESSOR Take 1 tablet (25 mg total) by mouth 2 (two) times daily. Hold for sbp <110 and hr < 60/min   multivitamin Tabs tablet Take 1 tablet by mouth daily.   nitroGLYCERIN 0.4 MG SL tablet Commonly known as: NITROSTAT Place 0.4 mg under the tongue every 5 (five) minutes as needed for chest pain.   omeprazole 40 MG capsule Commonly known as: PRILOSEC Take 1 capsule (40 mg total) by mouth daily.   ondansetron 4 MG disintegrating tablet Commonly known as: ZOFRAN-ODT Take 4 mg by mouth every 8 (eight) hours as needed for nausea or vomiting.   rosuvastatin 10 MG tablet Commonly known as: CRESTOR Take 1 tablet by mouth once daily   SandoSTATIN LAR [place] 20 MG injection Generic drug: octreotide Inject 20 mg into the muscle every 28 (twenty-eight) days.        Follow-up Information     Dyann Kief, PA-C Follow up on 01/02/2023.   Specialty: Cardiology Why: 1:00PM. Cardiology follow up Contact information: [address] [place] [address] [phone]         [facility] Follow up in 1 week(s).   Contact information: [address] [place] [address] [phone]         Antoine Poche, MD Follow up in 2 week(s).   Specialty: Cardiology Contact information: [address] [place] [address] [phone]                Allergies  Allergen Reactions   Amlodipine Swelling   Aspirin Other (See Comments)    High Doses Mess up her stomach; "makes my bowels have blood in them". Takes 81 mg EC Aspirin    Nitrofurantoin Hives   Ranexa [Ranolazine] Other (See Comments)    Myoclonus-hospitalized    Bactrim [Sulfamethoxazole-Trimethoprim] Rash   Contrast Media [Iodinated Contrast Media] Itching   Iron Itching and Other (See Comments)    "they gave me  iron in dialysis; had to give me Benadryl cause I had to have the iron" (05/02/2012)   Gabapentin Other (See Comments)    Unknown reaction   Iron Sucrose Other (See Comments)    Unknown   Sucroferric Oxyhydroxide Other (See Comments)    Unknown   Levaquin [Levofloxacin In D5w] Rash   Pantoprazole Rash   Plavix [Clopidogrel Bisulfate] Rash   Protonix [Pantoprazole  Sodium] Rash   Venofer [Ferric Oxide] Itching and Other (See Comments)    Patient reports using Benadryl prior to doses as [facility]    The results of significant diagnostics from this hospitalization (including imaging, microbiology, ancillary and laboratory) are listed below for reference.    Microbiology: Recent Results (from the past 240 hour(s))  Respiratory (~20 pathogens) panel by PCR     Status: Abnormal   Collection Time: 12/22/22  8:32 AM   Specimen: Nasopharyngeal Swab; Respiratory  Result Value Ref Range Status   Adenovirus NOT DETECTED NOT DETECTED Final   Coronavirus 229E NOT DETECTED NOT DETECTED Final    Comment: (NOTE) The Coronavirus on the Respiratory Panel, DOES NOT test for the novel  Coronavirus (2019 nCoV)    Coronavirus HKU1 NOT DETECTED NOT DETECTED Final   Coronavirus NL63 NOT DETECTED NOT DETECTED Final   Coronavirus OC43 NOT DETECTED NOT DETECTED Final   Metapneumovirus DETECTED (A) NOT DETECTED Final   Rhinovirus / Enterovirus NOT DETECTED NOT DETECTED Final   Influenza A NOT DETECTED NOT DETECTED Final   Influenza B NOT DETECTED NOT DETECTED Final   Parainfluenza Virus 1 NOT DETECTED NOT DETECTED Final   Parainfluenza Virus 2 NOT DETECTED NOT DETECTED Final   Parainfluenza Virus 3 NOT DETECTED NOT DETECTED Final   Parainfluenza Virus 4 NOT DETECTED NOT DETECTED Final   Respiratory Syncytial Virus NOT DETECTED NOT DETECTED Final   Bordetella pertussis NOT DETECTED NOT DETECTED Final   Bordetella Parapertussis NOT DETECTED NOT DETECTED Final   Chlamydophila pneumoniae NOT DETECTED  NOT DETECTED Final   Mycoplasma pneumoniae NOT DETECTED NOT DETECTED Final    Comment: Performed at [facility], [address] [place], [place] [address]  SARS Coronavirus 2 by RT PCR (hospital order, performed in [facility] hospital lab) *cepheid single result test* Anterior Nasal Swab     Status: None   Collection Time: 12/22/22  8:33 AM   Specimen: Anterior Nasal Swab  Result Value Ref Range Status   SARS Coronavirus 2 by RT PCR NEGATIVE NEGATIVE Final    Comment: Performed at [facility], [address], [place], [place] [address]  Culture, blood (Routine X 2) w Reflex to ID Panel     Status: None   Collection Time: 12/22/22  5:58 PM   Specimen: BLOOD RIGHT HAND  Result Value Ref Range Status   Specimen Description BLOOD RIGHT HAND  Final   Special Requests   Final    BOTTLES DRAWN AEROBIC AND ANAEROBIC Blood Culture adequate volume   Culture   Final    NO GROWTH 5 DAYS Performed at [facility], [address], [place], [place] [address]    Report Status 12/27/2022 FINAL  Final  Culture, blood (Routine X 2) w Reflex to ID Panel     Status: None   Collection Time: 12/22/22  5:58 PM   Specimen: BLOOD RIGHT HAND  Result Value Ref Range Status   Specimen Description BLOOD RIGHT HAND  Final   Special Requests   Final    BOTTLES DRAWN AEROBIC AND ANAEROBIC Blood Culture adequate volume   Culture   Final    NO GROWTH 5 DAYS Performed at [facility], [address] [place], [place] [address]    Report Status 12/27/2022 FINAL  Final    Procedures/Studies: DG Chest Port 1 View  Result Date: 12/22/2022 CLINICAL DATA:  82 year old female with fever. EXAM: PORTABLE CHEST 1 VIEW COMPARISON:  Portable chest 12/20/2022 and earlier. FINDINGS: Portable AP semi upright view at 0859 hours. Prior CABG. Stable cardiac size and mediastinal contours. Extensive Calcified aortic atherosclerosis. left chest dual lumen dialysis type catheter. Left axillary vascular stent.  Small left pleural effusion, left lung base ventilation appears mildly improved. No pneumothorax or pulmonary edema. No new pulmonary opacity. Stable visualized osseous structures. Negative visible bowel gas. IMPRESSION: 1. Small left pleural effusion with improved left lung base ventilation since 12/20/2022. 2. No new cardiopulmonary abnormality. 3.  Aortic Atherosclerosis (ICD10-I70.0). Electronically Signed   By: Odessa Fleming M.D.   On: 12/22/2022 09:36   DG Chest Port 1 View  Result Date: 12/20/2022 CLINICAL DATA:  Shortness of breath EXAM: PORTABLE CHEST 1 VIEW COMPARISON:  Previous studies including the examination done on 12/14/2022 FINDINGS: Transverse diameter of heart is increased. Central pulmonary vessels are more prominent. There is increase in interstitial markings in the parahilar regions. There is no focal pulmonary consolidation. There is blunting of both lateral CP angles. There is interval decrease in amount of bilateral pleural effusions. Vascular stent is seen in left upper arm. Degenerative changes are noted in right shoulder. There is previous coronary bypass surgery. Tip of left IJ central venous catheter is seen in right atrium. IMPRESSION: Cardiomegaly. Central pulmonary vessels are more prominent suggesting CHF. There is subtle increase in interstitial markings in the parahilar regions suggesting possible mild interstitial edema. Small bilateral pleural effusions with interval decrease in size. Electronically Signed   By: Ernie Avena M.D.   On: 12/20/2022 13:30   DG Chest Port 1 View  Result Date: 12/14/2022 CLINICAL DATA:  Altered mental status tachycardia EXAM: PORTABLE CHEST 1 VIEW COMPARISON:  Previous studies including the examination of 12/04/2022 FINDINGS: Transverse diameter of heart is increased. There are no signs of alveolar pulmonary edema. Small bilateral pleural effusions are noted, more so on the left side with interval increase. Linear densities are seen in left  lower lung field. There is no pneumothorax. Tip of left IJ dialysis catheter is seen in the region of junction of superior vena cava and right atrium. Vascular stents are noted in the left upper arm. Degenerative changes are noted in right shoulder. There is previous coronary bypass surgery. IMPRESSION: Small bilateral pleural effusions, more so on the left side with interval increase. Cardiomegaly. There are no signs of alveolar pulmonary edema. Linear densities in the left lower lung field may suggest subsegmental atelectasis. Electronically Signed   By: Ernie Avena M.D.   On: 12/14/2022 18:28   DG CHEST PORT 1 VIEW  Result Date: 12/04/2022 CLINICAL DATA:  [id number] Fever [id number] EXAM: PORTABLE CHEST 1 VIEW COMPARISON:  CTA GI bleed 11/13/2022 FINDINGS: Left chest wall dialysis catheter with tip overlying the right atrium. The heart and mediastinal contours are unchanged. Surgical changes likely mediastinum. Coronary artery stent. Aortic calcification. Bibasilar atelectasis. Persistent retrocardiac airspace opacity. No pulmonary edema. Persistent trace right and trace to small left pleural effusions. No pneumothorax. No acute osseous abnormality. Degenerative changes of the right shoulder. Intact sternotomy wires. IMPRESSION: 1. Persistent retrocardiac airspace opacity. 2. Persistent trace right and trace to small left pleural effusions. Electronically Signed   By: Tish Frederickson M.D.   On: 12/04/2022 22:07    Labs: BNP (last 3 results) Recent Labs    08/06/22 0139 08/26/22 1410 10/23/22 1420  BNP 1,666.2* 1,590.0* 2,205.0*   Basic Metabolic Panel: Recent Labs  Lab 12/23/22 1124 12/24/22 0045 12/25/22 0105 12/26/22 0126 12/27/22 0051  NA 131* 134*  129* 128* 131*  K 4.3 3.8 3.9 4.1 4.1  CL 94* 96* 93* 90* 96*  CO2 23 26 25 26 23   GLUCOSE 80 80 102* 114* 95  BUN 33* 13 25* 39* 16  CREATININE 7.35* 3.63* 5.54* 7.33* 3.96*  CALCIUM 7.9* 8.3* 8.3* 8.6* 8.5*  PHOS 4.4  --   --   --    --    Liver Function Tests: Recent Labs  Lab 12/23/22 1124  ALBUMIN 2.3*   No results for input(s): "LIPASE", "AMYLASE" in the last 168 hours. No results for input(s): "AMMONIA" in the last 168 hours. CBC: Recent Labs  Lab 12/22/22 0010 12/24/22 0045 12/25/22 0105 12/26/22 0126 12/27/22 0051  WBC 6.8 5.9 9.2 10.2 8.1  HGB 9.4* 9.5* 9.3* 9.5* 9.3*  HCT 28.8* 29.5* 29.0* 29.2* 28.9*  MCV 99.7 103.5* 104.7* 103.5* 101.8*  PLT 197 188 203 229 237   Recent Labs  Lab 12/22/22 1121  GLUCAP 138*  Anemia work up No results for input(s): "VITAMINB12", "FOLATE", "FERRITIN", "TIBC", "IRON", "RETICCTPCT" in the last 72 hours. Urinalysis    Component Value Date/Time   COLORURINE YELLOW 12/16/2012 1919   APPEARANCEUR CLOUDY (A) 12/16/2012 1919   LABSPEC 1.009 12/16/2012 1919   PHURINE 7.5 12/16/2012 1919   GLUCOSEU NEGATIVE 12/16/2012 1919   HGBUR TRACE (A) 12/16/2012 1919   BILIRUBINUR NEGATIVE 12/16/2012 1919   KETONESUR NEGATIVE 12/16/2012 1919   PROTEINUR 100 (A) 12/16/2012 1919   UROBILINOGEN 0.2 12/16/2012 1919   NITRITE NEGATIVE 12/16/2012 1919   LEUKOCYTESUR SMALL (A) 12/16/2012 1919   Sepsis Labs Recent Labs  Lab 12/24/22 0045 12/25/22 0105 12/26/22 0126 12/27/22 0051  WBC 5.9 9.2 10.2 8.1   Microbiology Recent Results (from the past 240 hour(s))  Respiratory (~20 pathogens) panel by PCR     Status: Abnormal   Collection Time: 12/22/22  8:32 AM   Specimen: Nasopharyngeal Swab; Respiratory  Result Value Ref Range Status   Adenovirus NOT DETECTED NOT DETECTED Final   Coronavirus 229E NOT DETECTED NOT DETECTED Final    Comment: (NOTE) The Coronavirus on the Respiratory Panel, DOES NOT test for the novel  Coronavirus (2019 nCoV)    Coronavirus HKU1 NOT DETECTED NOT DETECTED Final   Coronavirus NL63 NOT DETECTED NOT DETECTED Final   Coronavirus OC43 NOT DETECTED NOT DETECTED Final   Metapneumovirus DETECTED (A) NOT DETECTED Final   Rhinovirus / Enterovirus  NOT DETECTED NOT DETECTED Final   Influenza A NOT DETECTED NOT DETECTED Final   Influenza B NOT DETECTED NOT DETECTED Final   Parainfluenza Virus 1 NOT DETECTED NOT DETECTED Final   Parainfluenza Virus 2 NOT DETECTED NOT DETECTED Final   Parainfluenza Virus 3 NOT DETECTED NOT DETECTED Final   Parainfluenza Virus 4 NOT DETECTED NOT DETECTED Final   Respiratory Syncytial Virus NOT DETECTED NOT DETECTED Final   Bordetella pertussis NOT DETECTED NOT DETECTED Final   Bordetella Parapertussis NOT DETECTED NOT DETECTED Final   Chlamydophila pneumoniae NOT DETECTED NOT DETECTED Final   Mycoplasma pneumoniae NOT DETECTED NOT DETECTED Final    Comment: Performed at [facility], [address], [place], [place] [address]  SARS Coronavirus 2 by RT PCR (hospital order, performed in [facility] hospital lab) *cepheid single result test* Anterior Nasal Swab     Status: None   Collection Time: 12/22/22  8:33 AM   Specimen: Anterior Nasal Swab  Result Value Ref Range Status   SARS Coronavirus 2 by RT PCR NEGATIVE NEGATIVE Final  Comment: Performed at [facility], [address], [place], [place] [address]  Culture, blood (Routine X 2) w Reflex to ID Panel     Status: None   Collection Time: 12/22/22  5:58 PM   Specimen: BLOOD RIGHT HAND  Result Value Ref Range Status   Specimen Description BLOOD RIGHT HAND  Final   Special Requests   Final    BOTTLES DRAWN AEROBIC AND ANAEROBIC Blood Culture adequate volume   Culture   Final    NO GROWTH 5 DAYS Performed at [facility], [address] [place], [place] [address]    Report Status 12/27/2022 FINAL  Final  Culture, blood (Routine X 2) w Reflex to ID Panel     Status: None   Collection Time: 12/22/22  5:58 PM   Specimen: BLOOD RIGHT HAND  Result Value Ref Range Status   Specimen Description BLOOD RIGHT HAND  Final   Special Requests   Final    BOTTLES DRAWN AEROBIC AND ANAEROBIC Blood Culture adequate volume   Culture   Final     NO GROWTH 5 DAYS Performed at [facility], [address] [place], [place] [address]    Report Status 12/27/2022 FINAL  Final     Time coordinating discharge: 35 minutes  SIGNED: Lanae Boast, MD  [facility] 12/27/2022, 10:38 AM  If 7PM-7AM, please contact night-coverage [email or web address]

## 2022-12-27 NOTE — Progress Notes (Signed)
PROGRESS NOTE Shawna [name]  BJY:[id number] DOB: [birth year] DOA: 12/14/2022 PCP: [facility]  Brief Narrative/Hospital Course: 82 year old with past medical history significant for hypertension, hyperlipidemia, CAD status post CABG and PCI, paroxysmal A-fib, no longer on anticoagulation due to recurrent GI bleed, ESRD on hemodialysis (MWF), seizure disorder, hypothyroidism, history of pulmonary. Presented to the ED complaining of chest pain while she was returning from dialysis the day of admission. Reports chest pain sharp in nature, 10 out of 10 associated with palpitation. Patient admission to [facility]/due to AVM, received blood transfusion and was treated with argon plasma hospitalization. Developed paroxysmal Atrial flutter which was difficult to control she was placed on amiodarone and Coreg.  Seen by cardiology now on metoprolol, amiodarone.  Patient not on anticoagulation due to prior history of GI bleeding. ? Of Wide complex tachycardia, axis no change this is related to her left bundle branch block suspicious for atrial tachycardia versus atrial flutter- seen by cardiology Complicated hospitalization due to acute pulmonary edema from fluid overload needing DAILY HD Patient had lethargy acute metabolic encephalopathy with multiple comorbidities, She also tested positive for metapneumovirus, blood culture 6/13 done for fever was no growth so far Still deconditioned and weak but alert awake clinically improving. Waiting for skilled nursing facility approval. Patient remains medically stable for discharge    Subjective: Patient is more alert awake she ate by herself  Later on she is currently lethargic sleepy which is likely post meal  Also needing assistance and appears very weak  Husband was hesitant with her discharge today  BP appearing soft will hold metoprolol and hydralazine again   Assessment and Plan: Principal Problem:   Paroxysmal atrial flutter (HCC) Active  Problems:   Macrocytic anemia   End-stage renal disease on hemodialysis (HCC)   Essential hypertension   Diabetes mellitus type 2 in nonobese (HCC)   Acquired hypothyroidism   Mixed hyperlipidemia   Wide-complex tachycardia  Metapneumovirus infection: Patient had fever 6/13 workup showed metapneumovirus infection+- otherwise negative COVID,blood culture  ngtd. CXR no pneumonia but small left pleural effusion with improved left lung base ventilation.  No more fever spike since 6/13 but still with generalized weakness so will benefit with rehab placement   Atrial tachycardia/paroxysmal atrial fibrillation/flutter/?WCT: axis had no changes so per cardio WCT is related to her left bundle branch block likely from atrial tachycardia versus atrial flutter that showed as WCT. Cont rhythm control strategy with amiodarone,metoprolol-holding for heart rate less than 60 or SBP less than 110.  Did not get metoprolol and hydralazine this morning, will cut down the dose of metoprolol . No anticoagulation due to history of GI bleed.    Chronic systolic CHF with EF 30-35% CAD status post CABG with residual small vessel disease Chest pain due to demand ischemia: known history of CABG vein grafts are patent however has residual small vessel disease that is being managed medically suspect her angina was related to tachycardia.Not on aspirin due to history of GI bleeding Cardiology consulted, signed off, last HD 6/17   Acute pulmonary edema/volume overload ESRD on HD MWF: At this time fluid overload has improved continue regular HD per nephrology.S/p serial HD on 6/ 11, 6/12, 6/13 due to fluid overload-dry weight has been decreased   AoCKD/Anemia of chronic renal disease: hb dropped to 6.6 g s/p 1 unit PRBC with HD 6/12> hb improved appropriately holding in 9 g range stable. Previous transfusion was on 5/22.She has history of GI bleed but denies dark stool or  any bleeding.Continue weekly ESA,   Acute  metabolic encephalopathy Lethargy-confusion weakness deconditioning and debility: This is in the setting of patient's metapneumovirus infection, fluid overload number comorbidities.She is alert awake mildly confused and is weak> hold off on hydralazine which could be contributing to her weakness.   Secondary hyperparathyroidism Phos at goal holding sevelamer for now. Continue calcitriol and Sensipar 3x weekly per nephrology    Hypertension: BP was soft Meds adjusted decreasing dose of hydralazine-hold if blood pressure less than 110s systolic, continue metoprolol.> decrease hydralazine dose further    Hyperlipidemia:Continue statin   Type diabetes mellitus diet controlled-CBG STABLE.   Acquired hypothyroidism on levothyroxine.   GOC: She is weak and frail, high risk for readmission, decompensation  with chronic systolic CHF with CAD currently on dialysis dependent now with meta pneumoviral infection. I had called and updated patient's daughter 6/11.Palliative care has been consulted and following closely appreciate input, remains full code DVT prophylaxis: SCDs Start: 12/14/22 2155 Code Status:   Code Status: Full Code Family Communication: plan of care discussed with patient at bedside. I called to updated her daughter 6/14, no answer-previously called and updated her husband as well.   Patient status is: Inpatient because of shortness of breath, arteriosclerosis of PRN Level of care: Telemetry Medical   Dispo: The patient is from: home with husband, but currently in SNF since last week            Anticipated disposition: Insurance auth obtained today good until 6/19 husband is not comfortable with going her to SNF today,anticipate discharge tomorrow   Objective: Vitals last 24 hrs: Vitals:   12/27/22 0541 12/27/22 0611 12/27/22 0905 12/27/22 1033  BP: (!) 122/49  (!) 118/42 (!) 98/34  Pulse: 60  (!) 55 60  Resp: 16 (!) 23 19 20   Temp: 98.2 F (36.8 C)  98.2 F (36.8 C) 98 F  (36.7 C)  TempSrc: Oral  Oral Oral  SpO2: 96%  97% 100%  Weight:  50.1 kg    Height:       Weight change: 1 kg  Physical Examination: General exam: AA , appears weak and older than stated HEENT:Oral mucosa moist, Ear/Nose WNL grossly, dentition normal. Respiratory system: bilaterally diminished BS,no use of accessory muscle Cardiovascular system: S1 & S2 +, regular rate. Gastrointestinal system: Abdomen soft, NT,ND,BS+ Nervous System:Alert, awake, moving extremities and grossly nonfocal Extremities: LE ankle edema neg, lower extremities warm Skin: No rashes,no icterus. MSK: normal/small/weak muscle bulk,tone, power   Medications reviewed:  Scheduled Meds:  amiodarone  200 mg Oral Daily   atorvastatin  10 mg Oral QHS   calcitRIOL  1.75 mcg Oral Q M,W,F-HD   Chlorhexidine Gluconate Cloth  6 each Topical Q0600   cinacalcet  30 mg Oral Q M,W,F-HD   darbepoetin (ARANESP) injection - DIALYSIS  150 mcg Subcutaneous Q Mon-1800   gabapentin  100 mg Oral QPM   hydrALAZINE  25 mg Oral BID   levETIRAcetam  500 mg Oral Daily   levothyroxine  75 mcg Oral QAC breakfast   metoprolol tartrate  25 mg Oral BID  Continuous Infusions:  Diet Order             Diet heart healthy/carb modified Room service appropriate? Yes; Fluid consistency: Thin  Diet effective now                  Intake/Output Summary (Last 24 hours) at 12/27/2022 1404 Last data filed at 12/27/2022 1032 Gross per 24 hour  Intake 240  ml  Output --  Net 240 ml    Net IO Since Admission: -10,436.65 mL [12/27/22 1404]  Wt Readings from Last 3 Encounters:  12/27/22 50.1 kg  12/12/22 56.9 kg  11/18/22 59.4 kg     Unresulted Labs (From admission, onward)     Start     Ordered   12/24/22 0500  Basic metabolic panel  Daily,   R     Question:  Specimen collection method  Answer:  Lab=Lab collect   12/23/22 1004   12/24/22 0500  CBC  Daily,   R     Question:  Specimen collection method  Answer:  Lab=Lab collect    12/23/22 1004   Signed and Held  Renal function panel  Once,   R       Question:  Specimen collection method  Answer:  Lab=Lab collect   Signed and Held   Signed and Held  CBC  Once,   R       Question:  Specimen collection method  Answer:  Lab=Lab collect   Signed and Held          Data Reviewed: I have personally reviewed following labs and imaging studies CBC: Recent Labs  Lab 12/22/22 0010 12/24/22 0045 12/25/22 0105 12/26/22 0126 12/27/22 0051  WBC 6.8 5.9 9.2 10.2 8.1  HGB 9.4* 9.5* 9.3* 9.5* 9.3*  HCT 28.8* 29.5* 29.0* 29.2* 28.9*  MCV 99.7 103.5* 104.7* 103.5* 101.8*  PLT 197 188 203 229 237   Basic Metabolic Panel: Recent Labs  Lab 12/23/22 1124 12/24/22 0045 12/25/22 0105 12/26/22 0126 12/27/22 0051  NA 131* 134* 129* 128* 131*  K 4.3 3.8 3.9 4.1 4.1  CL 94* 96* 93* 90* 96*  CO2 23 26 25 26 23   GLUCOSE 80 80 102* 114* 95  BUN 33* 13 25* 39* 16  CREATININE 7.35* 3.63* 5.54* 7.33* 3.96*  CALCIUM 7.9* 8.3* 8.3* 8.6* 8.5*  PHOS 4.4  --   --   --   --    [id number] Creatinine Clearance: 8.7 mL/min (A) (by C-G formula based on SCr of 3.96 mg/dL (H)). Liver Function Tests: Recent Labs  Lab 12/23/22 1124  ALBUMIN 2.3*    Recent Results (from the past 240 hour(s))  Respiratory (~20 pathogens) panel by PCR     Status: Abnormal   Collection Time: 12/22/22  8:32 AM   Specimen: Nasopharyngeal Swab; Respiratory  Result Value Ref Range Status   Adenovirus NOT DETECTED NOT DETECTED Final   Coronavirus 229E NOT DETECTED NOT DETECTED Final    Comment: (NOTE) The Coronavirus on the Respiratory Panel, DOES NOT test for the novel  Coronavirus (2019 nCoV)    Coronavirus HKU1 NOT DETECTED NOT DETECTED Final   Coronavirus NL63 NOT DETECTED NOT DETECTED Final   Coronavirus OC43 NOT DETECTED NOT DETECTED Final   Metapneumovirus DETECTED (A) NOT DETECTED Final   Rhinovirus / Enterovirus NOT DETECTED NOT DETECTED Final   Influenza A NOT DETECTED NOT DETECTED  Final   Influenza B NOT DETECTED NOT DETECTED Final   Parainfluenza Virus 1 NOT DETECTED NOT DETECTED Final   Parainfluenza Virus 2 NOT DETECTED NOT DETECTED Final   Parainfluenza Virus 3 NOT DETECTED NOT DETECTED Final   Parainfluenza Virus 4 NOT DETECTED NOT DETECTED Final   Respiratory Syncytial Virus NOT DETECTED NOT DETECTED Final   Bordetella pertussis NOT DETECTED NOT DETECTED Final   Bordetella Parapertussis NOT DETECTED NOT DETECTED Final   Chlamydophila pneumoniae NOT DETECTED NOT DETECTED Final  Mycoplasma pneumoniae NOT DETECTED NOT DETECTED Final    Comment: Performed at [facility], [address] [place], [place] [address]  SARS Coronavirus 2 by RT PCR (hospital order, performed in [facility] hospital lab) *cepheid single result test* Anterior Nasal Swab     Status: None   Collection Time: 12/22/22  8:33 AM   Specimen: Anterior Nasal Swab  Result Value Ref Range Status   SARS Coronavirus 2 by RT PCR NEGATIVE NEGATIVE Final    Comment: Performed at [facility], [address], [place], [place] [address]  Culture, blood (Routine X 2) w Reflex to ID Panel     Status: None   Collection Time: 12/22/22  5:58 PM   Specimen: BLOOD RIGHT HAND  Result Value Ref Range Status   Specimen Description BLOOD RIGHT HAND  Final   Special Requests   Final    BOTTLES DRAWN AEROBIC AND ANAEROBIC Blood Culture adequate volume   Culture   Final    NO GROWTH 5 DAYS Performed at [facility], [address] [place], [place] [address]    Report Status 12/27/2022 FINAL  Final  Culture, blood (Routine X 2) w Reflex to ID Panel     Status: None   Collection Time: 12/22/22  5:58 PM   Specimen: BLOOD RIGHT HAND  Result Value Ref Range Status   Specimen Description BLOOD RIGHT HAND  Final   Special Requests   Final    BOTTLES DRAWN AEROBIC AND ANAEROBIC Blood Culture adequate volume   Culture   Final    NO GROWTH 5 DAYS Performed at [facility], [address], [place], [place] [address]    Report Status 12/27/2022 FINAL  Final  Antimicrobials: Anti-infectives (From admission, onward)    None     Culture/Microbiology    Component Value Date/Time   SDES BLOOD RIGHT HAND 12/22/2022 1758   SDES BLOOD RIGHT HAND 12/22/2022 1758   SPECREQUEST  12/22/2022 1758    BOTTLES DRAWN AEROBIC AND ANAEROBIC Blood Culture adequate volume   SPECREQUEST  12/22/2022 1758    BOTTLES DRAWN AEROBIC AND ANAEROBIC Blood Culture adequate volume   CULT  12/22/2022 1758    NO GROWTH 5 DAYS Performed at [facility], [address] [place], [place] [address]    CULT  12/22/2022 1758    NO GROWTH 5 DAYS Performed at [facility], [address] [place], [place] [address]    REPTSTATUS 12/27/2022 FINAL 12/22/2022 1758   REPTSTATUS 12/27/2022 FINAL 12/22/2022 1758  Radiology Studies: No results found.  LOS: 13 days  Lanae Boast, MD [facility]  12/27/2022, 2:04 PM

## 2022-12-27 NOTE — Plan of Care (Signed)
  Problem: Education: Goal: Knowledge of General Education information will improve Description: Including pain rating scale, medication(s)/side effects and non-pharmacologic comfort measures Outcome: Progressing   Problem: Health Behavior/Discharge Planning: Goal: Ability to manage health-related needs will improve Outcome: Progressing   Problem: Activity: Goal: Risk for activity intolerance will decrease Outcome: Progressing   

## 2022-12-27 NOTE — Progress Notes (Signed)
Notified Dr. Dayna Barker of vitals including decreased BPs and pulse rates through day today. MD states to not give ordered Metoprolol and Hydralazine. MD states patient may discharge to SNF as ordered.

## 2022-12-27 NOTE — Progress Notes (Signed)
D/C order noted. Contacted [employer] and spoke to [place]. Clinic advised of pt's d/c today and that pt should resume care tomorrow. D/C summary and yesterday's renal notes faxed to clinic for continuation of care.   Olivia Canter Renal Navigator [phone]

## 2022-12-27 NOTE — Telephone Encounter (Signed)
I spoke with the patient husband he is aware to speak with [facility] regarding the patient 1040/ income so they can get the patient free Sandostatin. Per Patient husband patient is back in the hospital.

## 2022-12-28 DIAGNOSIS — I4892 Unspecified atrial flutter: Secondary | ICD-10-CM | POA: Diagnosis not present

## 2022-12-28 MED ORDER — HEPARIN SODIUM (PORCINE) 1000 UNIT/ML IJ SOLN
INTRAMUSCULAR | Status: AC
  Filled 2022-12-28: qty 2

## 2022-12-28 MED ORDER — HEPARIN SODIUM (PORCINE) 1000 UNIT/ML DIALYSIS
2000.0000 [IU] | Freq: Once | INTRAMUSCULAR | Status: AC
  Administered 2022-12-28: 2000 [IU] via INTRAVENOUS_CENTRAL

## 2022-12-28 MED ORDER — HYDRALAZINE HCL 10 MG PO TABS: 10.0000 mg | ORAL_TABLET | Freq: Two times a day (BID) | ORAL | Status: DC

## 2022-12-28 NOTE — TOC Transition Note (Signed)
Transition of Care [facility]) - CM/SW Discharge Note   Patient Details  Name: Shawna [name] MRN: [id number] Date of Birth: [birth year]  Transition of Care [facility]) CM/SW Contact:  Leander Rams, LCSW Phone Number: 12/28/2022, 3:06 PM   Clinical Narrative:    Patient will DC to: [facility]  Anticipated DC date: 12/28/2022 Family notified: [name]  Transport by: Sharin Mons   Per MD patient ready for DC to [facility]. RN, patient, patient's family, and facility notified of DC. Discharge Summary and FL2 sent to facility. RN to call report prior to discharge [phone]. DC packet on chart. Ambulance transport requested for patient.   CSW will sign off for now as social work intervention is no longer needed. Please consult [place] again if new needs arise.    Final next level of care: Skilled Nursing Facility Barriers to Discharge: No Barriers Identified   Patient Goals and CMS Choice      Discharge Placement                Patient chooses bed at:  [facility]) Patient to be transferred to facility by: [facility] Name of family member notified: [name] Patient and family notified of of transfer: 12/28/22  Discharge Plan and Services Additional resources added to the After Visit Summary for                                       Social Determinants of Health (SDOH) Interventions SDOH Screenings   Food Insecurity: No Food Insecurity (12/22/2022)  Housing: Low Risk  (12/22/2022)  Transportation Needs: No Transportation Needs (12/22/2022)  Utilities: Not At Risk (12/22/2022)  Financial Resource Strain: Low Risk  (12/14/2018)  Physical Activity: Unknown (12/14/2018)  Social Connections: Unknown (12/14/2018)  Stress: No Stress Concern Present (12/14/2018)  Tobacco Use: Low Risk  (12/14/2022)     Readmission Risk Interventions    10/31/2022   12:45 PM 08/29/2022    4:47 PM 11/12/2021    8:56 AM  Readmission Risk Prevention Plan  Transportation Screening Complete Complete Complete   Medication Review Oceanographer) Complete Complete Complete  PCP or Specialist appointment within 3-5 days of discharge  Complete Complete  HRI or Home Care Consult Complete Complete Complete  SW Recovery Care/Counseling Consult Complete Complete Complete  Palliative Care Screening Not Applicable Not Applicable Not Applicable  Skilled Nursing Facility Not Applicable Complete Complete     Oletta Lamas, MSW, LCSWA, LCASA Transitions of Care  Clinical Social Worker I

## 2022-12-28 NOTE — Progress Notes (Signed)
[facility] Progress Note   Subjective: d/c cancelled yesterday- pts' husband noted a little confusion in the PM.  Seen on HD this AM- hoping to d/c this afternoon   Objective Vitals:   12/28/22 0900 12/28/22 0930 12/28/22 1000 12/28/22 1030  BP: (!) 174/69 (!) 164/61 (!) 168/63 (!) 153/61  Pulse: 64 60 (!) 50 67  Resp: 19 (!) 23 (!) 25 17  Temp:      TempSrc:      SpO2: 95% 97% 97% 98%  Weight:      Height:       Physical Exam General: Chronically frail appearing woman, NAD, awake.   Heart: RRR; no murmur Lungs: CTA anteriorly Abdomen: soft Extremities: no LE edema Dialysis Access: [facility]  Additional Objective Labs: Basic Metabolic Panel: Recent Labs  Lab 12/23/22 1124 12/24/22 0045 12/25/22 0105 12/26/22 0126 12/27/22 0051  NA 131*   < > 129* 128* 131*  K 4.3   < > 3.9 4.1 4.1  CL 94*   < > 93* 90* 96*  CO2 23   < > 25 26 23   GLUCOSE 80   < > 102* 114* 95  BUN 33*   < > 25* 39* 16  CREATININE 7.35*   < > 5.54* 7.33* 3.96*  CALCIUM 7.9*   < > 8.3* 8.6* 8.5*  PHOS 4.4  --   --   --   --    < > = values in this interval not displayed.   Liver Function Tests: Recent Labs  Lab 12/23/22 1124  ALBUMIN 2.3*   CBC: Recent Labs  Lab 12/22/22 0010 12/24/22 0045 12/25/22 0105 12/26/22 0126 12/27/22 0051  WBC 6.8 5.9 9.2 10.2 8.1  HGB 9.4* 9.5* 9.3* 9.5* 9.3*  HCT 28.8* 29.5* 29.0* 29.2* 28.9*  MCV 99.7 103.5* 104.7* 103.5* 101.8*  PLT 197 188 203 229 237   Blood Culture    Component Value Date/Time   SDES BLOOD RIGHT HAND 12/22/2022 1758   SDES BLOOD RIGHT HAND 12/22/2022 1758   SPECREQUEST  12/22/2022 1758    BOTTLES DRAWN AEROBIC AND ANAEROBIC Blood Culture adequate volume   SPECREQUEST  12/22/2022 1758    BOTTLES DRAWN AEROBIC AND ANAEROBIC Blood Culture adequate volume   CULT  12/22/2022 1758    NO GROWTH 5 DAYS Performed at [facility], [address] [place], [place] [address]    CULT  12/22/2022 1758    NO GROWTH 5  DAYS Performed at [facility], [address], [place], [place] [address]    REPTSTATUS 12/27/2022 FINAL 12/22/2022 1758   REPTSTATUS 12/27/2022 FINAL 12/22/2022 1758   Medications:   amiodarone  200 mg Oral Daily   atorvastatin  10 mg Oral QHS   calcitRIOL  1.75 mcg Oral Q M,W,F-HD   Chlorhexidine Gluconate Cloth  6 each Topical Q0600   cinacalcet  30 mg Oral Q M,W,F-HD   darbepoetin (ARANESP) injection - DIALYSIS  150 mcg Subcutaneous Q Mon-1800   gabapentin  100 mg Oral QPM   heparin sodium (porcine)       hydrALAZINE  10 mg Oral BID   levETIRAcetam  500 mg Oral Daily   levothyroxine  75 mcg Oral QAC breakfast   metoprolol tartrate  25 mg Oral BID    Dialysis Orders: MWF at [facility] EDW 58.5kg TDC 2K 2.5Ca BFR 300 DFR 500 Calcitrol 1.15mcg PO q HD Sensipar 30mg  PO q HD Mircera 200 mcg IV q 2 weeks- Last  dose 11/28/22   Assessment/Plan: 1.  Paroxysmal Afib: Cardiology initially involved, on amiodarone + metoprolol, no AC due to Hx GIB.   2.  Metapneumovirus: Fevers resolved. Management per PMD.  3. Shortness of breath: Improved s/p serial HD for volume removal. Now well under her prior dry weight, needs to be lowered on discharge. 4.  ESRD: Continue HD on MWF schedule. Clots HD circuit without small amount of heparin. Next 6/19, today 5.  Secondary hyperparathyroidism: CorrCa and Phos to goal. Renvela on hold. Continue calcitriol and Sensipar q HD for now. 6.  Hypertension: BP low 6/15. On metoprolol 25mg  BID and hydralazine 50mg  TID -> reduced hydral to 25mg  BID with holding parameters -> better today. 7. Anemia of ESRD: Hgb down to 6.6 on 6/12, s/p 1U PRBCs. Hgb 9.3. Last tsat 88% - no iron needed.  Aranesp given q Monday here. 8. Dispo: Anticipating SNF placement. Palliative care consulted, still full scope for now. D.c today    Bufford Buttner MD 12/28/2022, 10:47 AM  [facility]

## 2022-12-28 NOTE — TOC Progression Note (Signed)
Transition of Care [facility]) - Progression Note    Patient Details  Name: Shawna [name] MRN: [id number] Date of Birth: [birth year]  Transition [facility] Care [facility]) CM/SW Contact  Leander Rams, LCSW Phone Number: 12/28/2022, 12:43 PM  Clinical Narrative:    CSW was informed that pt has been referred for outpatient palliative services to follow at SNF. CSW reached out to [facility] liaison [place] to have pt be followed when she dc to [facility].   TOC will continue to follow.         Expected Discharge Plan and Services         Expected Discharge Date: 12/28/22                                     Social Determinants of Health (SDOH) Interventions SDOH Screenings   Food Insecurity: No Food Insecurity (12/22/2022)  Housing: Low Risk  (12/22/2022)  Transportation Needs: No Transportation Needs (12/22/2022)  Utilities: Not At Risk (12/22/2022)  Financial Resource Strain: Low Risk  (12/14/2018)  Physical Activity: Unknown (12/14/2018)  Social Connections: Unknown (12/14/2018)  Stress: No Stress Concern Present (12/14/2018)  Tobacco Use: Low Risk  (12/14/2022)    Readmission Risk Interventions    10/31/2022   12:45 PM 08/29/2022    4:47 PM 11/12/2021    8:56 AM  Readmission Risk Prevention Plan  Transportation Screening Complete Complete Complete  Medication Review Oceanographer) Complete Complete Complete  PCP or Specialist appointment within 3-5 days of discharge  Complete Complete  HRI or Home Care Consult Complete Complete Complete  SW Recovery Care/Counseling Consult Complete Complete Complete  Palliative Care Screening Not Applicable Not Applicable Not Applicable  Skilled Nursing Facility Not Applicable Complete Complete  Oletta Lamas, MSW, LCSWA, LCASA Transitions of Care  Clinical Social Worker I

## 2022-12-28 NOTE — Progress Notes (Signed)
[facility] ([facility] Hospital Liaison Note  Notified by TOC manager of patient/family request for [facility] palliative services at SNF after discharge.   [facility] hospital liaison will follow patient for discharge disposition.   Please call with any hospice or outpatient palliative care related questions.   Thank you for the opportunity to participate in this patient's care.   Shanita Wicker, LCSW [facility] Hospital Liaison [phone]  

## 2022-12-28 NOTE — Progress Notes (Signed)
OT Cancellation Note  Patient Details Name: Shawna [name] MRN: [id number] DOB: Feb 26, 1941   Cancelled Treatment:    Reason Eval/Treat Not Completed: Patient at procedure or test/ unavailable (at HD)  Carver Fila, OTD, OTR/L [facility] [phone]   Dalphine Handing 12/28/2022, 3:40 PM

## 2022-12-28 NOTE — Progress Notes (Signed)
IV and Tele Removed Dressed packed belongs

## 2022-12-28 NOTE — Procedures (Signed)
Patient seen and examined on Hemodialysis. The procedure was supervised and I have made appropriate changes. BP (!) 153/61   Pulse 67   Temp 97.7 F (36.5 C)   Resp 17   Ht 5\' 2"  (1.575 m)   Wt 49.8 kg   SpO2 98%   BMI 20.08 kg/m   QB 400 mL/ min via TDC, UF goal 2L  Tolerating treatment without complaints at this time.   Bufford Buttner MD [facility] [phone] 10:49 AM

## 2022-12-28 NOTE — Progress Notes (Signed)
Report called to [facility] spoke to Sylvia/LPN.

## 2022-12-28 NOTE — Progress Notes (Signed)
   12/28/22 1225  Vitals  Temp 98.4 F (36.9 C)  Pulse Rate 68  Resp 16  BP (!) 165/99  SpO2 99 %  Post Treatment  Dialyzer Clearance Clear  Duration of HD Treatment -hour(s) 3.5 hour(s)  Hemodialysis Intake (mL) 100 mL  Liters Processed 84  Fluid Removed (mL) 0 mL  Tolerated HD Treatment Yes   Received patient in bed to unit.  Alert and oriented.  Informed consent signed and in chart.   TX duration:3.5hrs  Patient tolerated well.  Transported back to the room  Alert, without acute distress.  Hand-off given to patient's nurse.   Access used: [facility] Access issues: none  Total UF removed: +112mL Medication(s) given: none   [facility]amin[facility]

## 2022-12-28 NOTE — Discharge Summary (Signed)
Physician Discharge Summary  Shawna [name]:[id number] DOB: October 20, 1940 DOA: 12/14/2022  PCP: [facility]  Admit date: 12/14/2022 Discharge date: 12/28/2022  Time spent: 45 minutes  Recommendations for Outpatient Follow-up:  SNF for short-term rehab Outpatient palliative care follow-up for ongoing goals of care discussions CBC in 1 week   Discharge Diagnoses:  Principal Problem:   Paroxysmal atrial flutter (HCC) Metapneumovirus infection Cognitive deficits Delirium   Macrocytic anemia   End-stage renal disease on hemodialysis (HCC)   Essential hypertension   Diabetes mellitus type 2 in nonobese [facility])   Acquired hypothyroidism   Mixed hyperlipidemia   Wide-complex tachycardia   Discharge Condition: Improved  Diet recommendation: Renal, diabetic  Filed Weights   12/26/22 1235 12/27/22 0611 12/28/22 0830  Weight: 48.3 kg 50.1 kg 49.8 kg    History of present illness:  82 year old with past medical history significant for hypertension, hyperlipidemia, CAD status post CABG and PCI, paroxysmal A-fib, no longer on anticoagulation due to recurrent GI bleed, ESRD on hemodialysis (MWF), seizure disorder, hypothyroidism, history of pulmonary. Presented to the ED complaining of chest pain while she was returning from dialysis the day of admission. Reports chest pain sharp in nature, 10 out of 10 associated with palpitation. Patient admission to [facility]/due to AVM, received blood transfusion and was treated with argon plasma hospitalization. Developed paroxysmal Atrial flutter which was difficult to control she was placed on amiodarone and Coreg.  Seen by cardiology now on metoprolol, amiodarone.  Patient not on anticoagulation due to prior history of GI bleeding. ? Of Wide complex tachycardia, axis no change this is related to her left bundle branch block suspicious for atrial tachycardia versus atrial flutter- seen by cardiology Complicated hospitalization due to acute  pulmonary edema from fluid overload needing DAILY HD Patient had lethargy acute metabolic encephalopathy with multiple comorbidities, She also tested positive for metapneumovirus, blood culture 6/13 done for fever was no growth so far Still deconditioned and weak but alert awake clinically improving.  Hospital Course:  Metapneumovirus infection: Patient had fever 6/13 workup showed metapneumovirus infection+ -negative COVID,blood culture  ngtd. CXR no pneumonia but small left pleural effusion  -Improved and afebrile since 6/13, still with general debility and deconditioning, plan for short-term rehab    Atrial tachycardia/paroxysmal atrial fibrillation/flutter/?WCT: -per cardio WCT is related to her left bundle branch block likely from atrial tachycardia versus atrial flutter that showed as WCT. Cont rhythm control strategy with amiodarone, metoprolol  - No anticoagulation due to history of GI bleed.    Chronic systolic CHF with EF 30-35% CAD status post CABG with residual small vessel disease Chest pain due to demand ischemia: known history of CABG vein grafts are patent however has residual small vessel disease that is being managed medically suspect her angina was related to tachycardia.Not on aspirin due to history of GI bleeding Cardiology consulted, signed off, last HD 6/17   Acute pulmonary edema/volume overload ESRD on HD MWF: At this time fluid overload has improved continue regular HD per nephrology.S/p serial HD on 6/ 11, 6/12, 6/13 due to fluid overload-dry weight has been decreased   AoCKD/Anemia of chronic renal disease: hb dropped to 6.6 g s/p 1 unit PRBC with HD 6/12> hb improved appropriately holding in 9 g range stable. -Continue EPO with HD   Acute metabolic encephalopathy Lethargy-confusion weakness deconditioning and debility: This is in the setting of patient's metapneumovirus infection, fluid overload number comorbidities. -Also suspect mild cognitive deficits  at baseline with some delirium in the  setting of hospitalization and metapneumovirus infection -Slowly improving, discharged to short-term rehab   Secondary hyperparathyroidism Phos at goal holding sevelamer for now. Continue calcitriol and Sensipar 3x weekly per nephrology    Hypertension: -Continue Toprol and hydralazine   Hyperlipidemia:Continue statin   Type diabetes mellitus diet controlled-CBG STABLE.   Acquired hypothyroidism on levothyroxine.   GOC: She is weak and frail, high risk for readmission, decompensation  with chronic systolic CHF with CAD, ESRD, malnutrition and frailty seen by palliative care, patient and family wish for short-term rehab they want to continue full code and full scope of treatment for now, recommend ongoing palliative care discussions after discharge  Consultations: Cardiology, nephrology, palliative care  Discharge Exam: Vitals:   12/28/22 1000 12/28/22 1030  BP: (!) 168/63 (!) 153/61  Pulse: (!) 50 67  Resp: (!) 25 17  Temp:    SpO2: 97% 98%   Chronically ill female laying in bed, AAO x 2, mild cognitive deficits HEENT: Dialysis catheter noted CVS: S1-S2, regular rhythm Lungs: Decreased breath sounds at the bases otherwise clear Abdomen: Soft, nontender, bowel sounds present Extremities: No edema  Discharge Instructions   Discharge Instructions     (HEART FAILURE PATIENTS) Call MD:  Anytime you have any of the following symptoms: 1) 3 pound weight gain in 24 hours or 5 pounds in 1 week 2) shortness of breath, with or without a dry hacking cough 3) swelling in the hands, feet or stomach 4) if you have to sleep on extra pillows at night in order to breathe.   Complete by: As directed    Discharge instructions   Complete by: As directed    Please call call MD or return to ER for similar or worsening recurring problem that brought you to hospital or if any fever,nausea/vomiting,abdominal pain, uncontrolled pain, chest pain,  shortness of  breath or any other alarming symptoms.  Please follow-up your doctor as instructed in a week time and call the office for appointment.  Please avoid alcohol, smoking, or any other illicit substance and maintain healthy habits including taking your regular medications as prescribed.  You were cared for by a hospitalist during your hospital stay. If you have any questions about your discharge medications or the care you received while you were in the hospital after you are discharged, you can call the unit and ask to speak with the hospitalist on call if the hospitalist that took care of you is not available.  Once you are discharged, your primary care physician will handle any further medical issues. Please note that NO REFILLS for any discharge medications will be authorized once you are discharged, as it is imperative that you return to your primary care physician (or establish a relationship with a primary care physician if you do not have one) for your aftercare needs so that they can reassess your need for medications and monitor your lab values   Discharge instructions   Complete by: As directed    Please call call MD or return to ER for similar or worsening recurring problem that brought you to hospital or if any fever,nausea/vomiting,abdominal pain, uncontrolled pain, chest pain,  shortness of breath or any other alarming symptoms.  Please follow-up your doctor as instructed in a week time and call the office for appointment.  Please avoid alcohol, smoking, or any other illicit substance and maintain healthy habits including taking your regular medications as prescribed.  You were cared for by a hospitalist during your hospital stay. If you  have any questions about your discharge medications or the care you received while you were in the hospital after you are discharged, you can call the unit and ask to speak with the hospitalist on call if the hospitalist that took care of you is not  available.  Once you are discharged, your primary care physician will handle any further medical issues. Please note that NO REFILLS for any discharge medications will be authorized once you are discharged, as it is imperative that you return to your primary care physician (or establish a relationship with a primary care physician if you do not have one) for your aftercare needs so that they can reassess your need for medications and monitor your lab values   Increase activity slowly   Complete by: As directed    Increase activity slowly   Complete by: As directed       Allergies as of 12/28/2022       Reactions   Amlodipine Swelling   Aspirin Other (See Comments)   High Doses Mess up her stomach; "makes my bowels have blood in them". Takes 81 mg EC Aspirin    Nitrofurantoin Hives   Ranexa [ranolazine] Other (See Comments)   Myoclonus-hospitalized    Bactrim [sulfamethoxazole-trimethoprim] Rash   Contrast Media [iodinated Contrast Media] Itching   Iron Itching, Other (See Comments)   "they gave me iron in dialysis; had to give me Benadryl cause I had to have the iron" (05/02/2012)   Gabapentin Other (See Comments)   Unknown reaction   Iron Sucrose Other (See Comments)   Unknown   Sucroferric Oxyhydroxide Other (See Comments)   Unknown   Levaquin [levofloxacin In D5w] Rash   Pantoprazole Rash   Plavix [clopidogrel Bisulfate] Rash   Protonix [pantoprazole Sodium] Rash   Venofer [ferric Oxide] Itching, Other (See Comments)   Patient reports using Benadryl prior to doses as [facility]        Medication List     STOP taking these medications    atorvastatin 10 MG tablet Commonly known as: LIPITOR   sevelamer carbonate 800 MG tablet Commonly known as: RENVELA       TAKE these medications    ALPRAZolam 0.5 MG tablet Commonly known as: XANAX Take 1 tablet (0.5 mg total) by mouth daily as needed for up to 4 doses for anxiety.   amiodarone 200 MG tablet Commonly  known as: PACERONE Take 200 mg by mouth daily. What changed: Another medication with the same name was removed. Continue taking this medication, and follow the directions you see here.   benzonatate 100 MG capsule Commonly known as: TESSALON Take 100 mg by mouth 3 (three) times daily as needed for cough.   calcitRIOL 0.25 MCG capsule Commonly known as: ROCALTROL Take 7 capsules (1.75 mcg total) by mouth every Monday, Wednesday, and Friday with hemodialysis.   cinacalcet 30 MG tablet Commonly known as: SENSIPAR Take 1 tablet (30 mg total) by mouth every Monday, Wednesday, and Friday with hemodialysis.   cyclobenzaprine 5 MG tablet Commonly known as: FLEXERIL Take 5 mg by mouth 3 (three) times daily as needed.   folic acid 1 MG tablet Commonly known as: FOLVITE Take 1 mg by mouth daily.   gabapentin 100 MG capsule Commonly known as: NEURONTIN Take 1 capsule (100 mg total) by mouth every evening.   guaiFENesin-codeine 100-10 MG/5ML syrup Take 5 mLs by mouth every 4 (four) hours as needed for cough.   hydrALAZINE 25 MG tablet Commonly known as:  APRESOLINE Take 1 tablet (25 mg total) by mouth 2 (two) times daily. Hold for sbp <110 What changed:  medication strength how much to take when to take this additional instructions   hydrOXYzine 25 MG tablet Commonly known as: ATARAX Take 25 mg by mouth every 6 (six) hours as needed for itching.   levETIRAcetam 500 MG tablet Commonly known as: KEPPRA Take 1 tablet (500 mg total) by mouth daily.   levothyroxine 75 MCG tablet Commonly known as: SYNTHROID Take 1 tablet (75 mcg total) by mouth daily before breakfast.   metoprolol tartrate 25 MG tablet Commonly known as: LOPRESSOR Take 1 tablet (25 mg total) by mouth 2 (two) times daily. Hold for sbp <110 and hr < 60/min   multivitamin Tabs tablet Take 1 tablet by mouth daily.   nitroGLYCERIN 0.4 MG SL tablet Commonly known as: NITROSTAT Place 0.4 mg under the tongue every 5  (five) minutes as needed for chest pain.   omeprazole 40 MG capsule Commonly known as: PRILOSEC Take 1 capsule (40 mg total) by mouth daily.   ondansetron 4 MG disintegrating tablet Commonly known as: ZOFRAN-ODT Take 4 mg by mouth every 8 (eight) hours as needed for nausea or vomiting.   rosuvastatin 10 MG tablet Commonly known as: CRESTOR Take 1 tablet by mouth once daily   SandoSTATIN LAR Depot 20 MG injection Generic drug: octreotide Inject 20 mg into the muscle every 28 (twenty-eight) days.       Allergies  Allergen Reactions   Amlodipine Swelling   Aspirin Other (See Comments)    High Doses Mess up her stomach; "makes my bowels have blood in them". Takes 81 mg EC Aspirin    Nitrofurantoin Hives   Ranexa [Ranolazine] Other (See Comments)    Myoclonus-hospitalized    Bactrim [Sulfamethoxazole-Trimethoprim] Rash   Contrast Media [Iodinated Contrast Media] Itching   Iron Itching and Other (See Comments)    "they gave me iron in dialysis; had to give me Benadryl cause I had to have the iron" (05/02/2012)   Gabapentin Other (See Comments)    Unknown reaction   Iron Sucrose Other (See Comments)    Unknown   Sucroferric Oxyhydroxide Other (See Comments)    Unknown   Levaquin [Levofloxacin In D5w] Rash   Pantoprazole Rash   Plavix [Clopidogrel Bisulfate] Rash   Protonix [Pantoprazole Sodium] Rash   Venofer [Ferric Oxide] Itching and Other (See Comments)    Patient reports using Benadryl prior to doses as [facility]    Follow-up Information     Dyann Kief, PA-C Follow up on 01/02/2023.   Specialty: Cardiology Why: 1:00PM. Cardiology follow up Contact information: [address] [place] [address] [phone]         [facility] Follow up in 1 week(s).   Contact information: [address] [place] [address] [phone]         Antoine Poche, MD Follow up in 2 week(s).   Specialty: Cardiology Contact information: [address] [place] [address] [phone]                  The results of significant diagnostics from this hospitalization (including imaging, microbiology, ancillary and laboratory) are listed below for reference.    Significant Diagnostic Studies: DG Chest Port 1 View  Result Date: 12/27/2022 CLINICAL DATA:  Cough. EXAM: PORTABLE CHEST 1 VIEW COMPARISON:  Chest radiograph dated 12/22/2022. FINDINGS: Dialysis catheter in similar position. No focal consolidation, pleural  effusion, or pneumothorax. Stable cardiac silhouette. Median sternotomy wires and coronary vascular stent. Atherosclerotic calcification of the aorta. No acute osseous pathology. Degenerative changes of the spine and right shoulder. IMPRESSION: No active disease. Electronically Signed   By: Elgie Collard M.D.   On: 12/27/2022 18:24   DG Chest Port 1 View  Result Date: 12/22/2022 CLINICAL DATA:  82 year old female with fever. EXAM: PORTABLE CHEST 1 VIEW COMPARISON:  Portable chest 12/20/2022 and earlier. FINDINGS: Portable AP semi upright view at 0859 hours. Prior CABG. Stable cardiac size and mediastinal contours. Extensive Calcified aortic atherosclerosis. left chest dual lumen dialysis type catheter. Left axillary vascular stent. Small left pleural effusion, left lung base ventilation appears mildly improved. No pneumothorax or pulmonary edema. No new pulmonary opacity. Stable visualized osseous structures. Negative visible bowel gas. IMPRESSION: 1. Small left pleural effusion with improved left lung base ventilation since 12/20/2022. 2. No new cardiopulmonary abnormality. 3.  Aortic Atherosclerosis (ICD10-I70.0). Electronically Signed   By: Odessa Fleming M.D.   On: 12/22/2022 09:36   DG Chest Port 1 View  Result Date: 12/20/2022 CLINICAL DATA:  Shortness of breath EXAM: PORTABLE CHEST 1 VIEW COMPARISON:  Previous studies including the examination done on 12/14/2022 FINDINGS: Transverse diameter of heart is increased.  Central pulmonary vessels are more prominent. There is increase in interstitial markings in the parahilar regions. There is no focal pulmonary consolidation. There is blunting of both lateral CP angles. There is interval decrease in amount of bilateral pleural effusions. Vascular stent is seen in left upper arm. Degenerative changes are noted in right shoulder. There is previous coronary bypass surgery. Tip of left IJ central venous catheter is seen in right atrium. IMPRESSION: Cardiomegaly. Central pulmonary vessels are more prominent suggesting CHF. There is subtle increase in interstitial markings in the parahilar regions suggesting possible mild interstitial edema. Small bilateral pleural effusions with interval decrease in size. Electronically Signed   By: Ernie Avena M.D.   On: 12/20/2022 13:30   DG Chest Port 1 View  Result Date: 12/14/2022 CLINICAL DATA:  Altered mental status tachycardia EXAM: PORTABLE CHEST 1 VIEW COMPARISON:  Previous studies including the examination of 12/04/2022 FINDINGS: Transverse diameter of heart is increased. There are no signs of alveolar pulmonary edema. Small bilateral pleural effusions are noted, more so on the left side with interval increase. Linear densities are seen in left lower lung field. There is no pneumothorax. Tip of left IJ dialysis catheter is seen in the region of junction of superior vena cava and right atrium. Vascular stents are noted in the left upper arm. Degenerative changes are noted in right shoulder. There is previous coronary bypass surgery. IMPRESSION: Small bilateral pleural effusions, more so on the left side with interval increase. Cardiomegaly. There are no signs of alveolar pulmonary edema. Linear densities in the left lower lung field may suggest subsegmental atelectasis. Electronically Signed   By: Ernie Avena M.D.   On: 12/14/2022 18:28   DG CHEST PORT 1 VIEW  Result Date: 12/04/2022 CLINICAL DATA:  [id number] Fever [id number]  EXAM: PORTABLE CHEST 1 VIEW COMPARISON:  CTA GI bleed 11/13/2022 FINDINGS: Left chest wall dialysis catheter with tip overlying the right atrium. The heart and mediastinal contours are unchanged. Surgical changes likely mediastinum. Coronary artery stent. Aortic calcification. Bibasilar atelectasis. Persistent retrocardiac airspace opacity. No pulmonary edema. Persistent trace right and trace to small left pleural effusions. No pneumothorax. No acute osseous abnormality. Degenerative changes of the right shoulder. Intact sternotomy wires. IMPRESSION: 1. Persistent retrocardiac airspace  opacity. 2. Persistent trace right and trace to small left pleural effusions. Electronically Signed   By: Tish Frederickson M.D.   On: 12/04/2022 22:07    Microbiology: Recent Results (from the past 240 hour(s))  Respiratory (~20 pathogens) panel by PCR     Status: Abnormal   Collection Time: 12/22/22  8:32 AM   Specimen: Nasopharyngeal Swab; Respiratory  Result Value Ref Range Status   Adenovirus NOT DETECTED NOT DETECTED Final   Coronavirus 229E NOT DETECTED NOT DETECTED Final    Comment: (NOTE) The Coronavirus on the Respiratory Panel, DOES NOT test for the novel  Coronavirus (2019 nCoV)    Coronavirus HKU1 NOT DETECTED NOT DETECTED Final   Coronavirus NL63 NOT DETECTED NOT DETECTED Final   Coronavirus OC43 NOT DETECTED NOT DETECTED Final   Metapneumovirus DETECTED (A) NOT DETECTED Final   Rhinovirus / Enterovirus NOT DETECTED NOT DETECTED Final   Influenza A NOT DETECTED NOT DETECTED Final   Influenza B NOT DETECTED NOT DETECTED Final   Parainfluenza Virus 1 NOT DETECTED NOT DETECTED Final   Parainfluenza Virus 2 NOT DETECTED NOT DETECTED Final   Parainfluenza Virus 3 NOT DETECTED NOT DETECTED Final   Parainfluenza Virus 4 NOT DETECTED NOT DETECTED Final   Respiratory Syncytial Virus NOT DETECTED NOT DETECTED Final   Bordetella pertussis NOT DETECTED NOT DETECTED Final   Bordetella Parapertussis NOT  DETECTED NOT DETECTED Final   Chlamydophila pneumoniae NOT DETECTED NOT DETECTED Final   Mycoplasma pneumoniae NOT DETECTED NOT DETECTED Final    Comment: Performed at [facility], [address], [place], [place] [address]  SARS Coronavirus 2 by RT PCR (hospital order, performed in [facility] hospital lab) *cepheid single result test* Anterior Nasal Swab     Status: None   Collection Time: 12/22/22  8:33 AM   Specimen: Anterior Nasal Swab  Result Value Ref Range Status   SARS Coronavirus 2 by RT PCR NEGATIVE NEGATIVE Final    Comment: Performed at [facility], [address] [place], [place] [address]  Culture, blood (Routine X 2) w Reflex to ID Panel     Status: None   Collection Time: 12/22/22  5:58 PM   Specimen: BLOOD RIGHT HAND  Result Value Ref Range Status   Specimen Description BLOOD RIGHT HAND  Final   Special Requests   Final    BOTTLES DRAWN AEROBIC AND ANAEROBIC Blood Culture adequate volume   Culture   Final    NO GROWTH 5 DAYS Performed at [facility], [address] [place], [place] [address]    Report Status 12/27/2022 FINAL  Final  Culture, blood (Routine X 2) w Reflex to ID Panel     Status: None   Collection Time: 12/22/22  5:58 PM   Specimen: BLOOD RIGHT HAND  Result Value Ref Range Status   Specimen Description BLOOD RIGHT HAND  Final   Special Requests   Final    BOTTLES DRAWN AEROBIC AND ANAEROBIC Blood Culture adequate volume   Culture   Final    NO GROWTH 5 DAYS Performed at [facility], [address], [place], [place] [address]    Report Status 12/27/2022 FINAL  Final     Labs: Basic Metabolic Panel: Recent Labs  Lab 12/23/22 1124 12/24/22 0045 12/25/22 0105 12/26/22 0126 12/27/22 0051  NA 131* 134* 129* 128* 131*  K 4.3 3.8 3.9 4.1 4.1  CL 94* 96* 93* 90* 96*  CO2 23 26 25  26 23  GLUCOSE 80 80 102* 114* 95  BUN 33* 13 25* 39* 16  CREATININE 7.35* 3.63* 5.54* 7.33* 3.96*  CALCIUM 7.9* 8.3* 8.3* 8.6* 8.5*   PHOS 4.4  --   --   --   --    Liver Function Tests: Recent Labs  Lab 12/23/22 1124  ALBUMIN 2.3*   No results for input(s): "LIPASE", "AMYLASE" in the last 168 hours. No results for input(s): "AMMONIA" in the last 168 hours. CBC: Recent Labs  Lab 12/22/22 0010 12/24/22 0045 12/25/22 0105 12/26/22 0126 12/27/22 0051  WBC 6.8 5.9 9.2 10.2 8.1  HGB 9.4* 9.5* 9.3* 9.5* 9.3*  HCT 28.8* 29.5* 29.0* 29.2* 28.9*  MCV 99.7 103.5* 104.7* 103.5* 101.8*  PLT 197 188 203 229 237   Cardiac Enzymes: No results for input(s): "CKTOTAL", "CKMB", "CKMBINDEX", "TROPONINI" in the last 168 hours. BNP: BNP (last 3 results) Recent Labs    08/06/22 0139 08/26/22 1410 10/23/22 1420  BNP 1,666.2* 1,590.0* 2,205.0*    ProBNP (last 3 results) No results for input(s): "PROBNP" in the last 8760 hours.  CBG: Recent Labs  Lab 12/22/22 1121  GLUCAP 138*       Signed:  Zannie Cove MD.  [facility] 12/28/2022, 10:56 AM

## 2022-12-28 NOTE — Plan of Care (Signed)

## 2022-12-28 NOTE — Progress Notes (Signed)
PT Cancellation Note  Patient Details Name: Shawna [name] MRN: [id number] DOB: Mar 19, 1941   Cancelled Treatment:    Reason Eval/Treat Not Completed: Patient at procedure or test/unavailable (Pt off the floor at HD. Will try later if time allows)   [name] 12/28/2022, 10:25 AM

## 2022-12-28 NOTE — TOC Progression Note (Signed)
Transition of Care [facility]) - Progression Note    Patient Details  Name: Shawna [name] MRN: [id number] Date of Birth: 1940-09-04  Transition of Care [facility]) CM/SW Contact  Leander Rams, LCSW Phone Number: 12/28/2022, 10:00 AM  Clinical Narrative:    Pt has received insurance auth for [facility]. Berkley Harvey ID# is [id number]. Auth is good for 6/17-6/19.         Expected Discharge Plan and Services         Expected Discharge Date: 12/27/22                                     Social Determinants of Health (SDOH) Interventions SDOH Screenings   Food Insecurity: No Food Insecurity (12/22/2022)  Housing: Low Risk  (12/22/2022)  Transportation Needs: No Transportation Needs (12/22/2022)  Utilities: Not At Risk (12/22/2022)  Financial Resource Strain: Low Risk  (12/14/2018)  Physical Activity: Unknown (12/14/2018)  Social Connections: Unknown (12/14/2018)  Stress: No Stress Concern Present (12/14/2018)  Tobacco Use: Low Risk  (12/14/2022)    Readmission Risk Interventions    10/31/2022   12:45 PM 08/29/2022    4:47 PM 11/12/2021    8:56 AM  Readmission Risk Prevention Plan  Transportation Screening Complete Complete Complete  Medication Review Oceanographer) Complete Complete Complete  PCP or Specialist appointment within 3-5 days of discharge  Complete Complete  HRI or Home Care Consult Complete Complete Complete  SW Recovery Care/Counseling Consult Complete Complete Complete  Palliative Care Screening Not Applicable Not Applicable Not Applicable  Skilled Nursing Facility Not Applicable Complete Complete   Oletta Lamas, MSW, LCSWA, LCASA Transitions of Care  Clinical Social Worker I

## 2022-12-29 NOTE — Progress Notes (Signed)
Late Note Entry  Pt was d/c to snf yesterday. Contacted [facility] this morning to advise clinic of pt's d/c date and that pt should resume care tomorrow. D/C summary and last renal notes faxed to clinic for continuation of care.   Olivia Canter Renal Navigator [phone]

## 2023-01-02 ENCOUNTER — Ambulatory Visit: Payer: Medicare Other | Admitting: Physician Assistant

## 2023-01-03 NOTE — Telephone Encounter (Signed)
I sent an Email to [employer] to check on the status of Patient assistance. Awaiting response.

## 2023-01-04 NOTE — Telephone Encounter (Signed)
I spoke with Shawna [name] [phone] with [employer] he says Shawna [name] is now handling the patient's case. He will have Shawna [name] to return my call.

## 2023-01-04 NOTE — Telephone Encounter (Signed)
Last given 11/15/2022 while in patient.

## 2023-01-04 NOTE — Telephone Encounter (Signed)
Per Milon Dikes, with [employer]  Hi Tonnya Garbett, I just confirmed with the pharmacy that we are waiting on paperwork from the patient's husband. We spoke with him last week and he said he was getting the information together to send to [place].   Have you heard anything new?  Is the patient still in hospital?  Once we get that information we will have it sent straight to [employer] for approval.

## 2023-01-04 NOTE — Telephone Encounter (Signed)
Can get low dose during HD.  Please keep me posted about the status of the medication.

## 2023-01-04 NOTE — Telephone Encounter (Signed)
I have called the Dialysis unit that the patient attends and spoke with Joy whom is the office administrator assistant she says she orders the medications and this is not something that she has ordered and would ask around if this is something that is planned,but this is not anything that she is aware of . Also patient receives Heparin and wondered if she soul be getting that during Hemodialysis. Please advise

## 2023-01-04 NOTE — Telephone Encounter (Signed)
I spoke with the patient and made him aware he will need to follow up with [employer] regarding the income, he is aware we can not move forward until he does this. He states understanding, and will try to get the information to [employer]. Patient is due to be released back home to his care on January 15, 2023 from [facility], so [facility] [employer] may still be able to come into the home to administer. He is aware we will try to see when patient is due for the shot and when the last time it was administered in the hospital.

## 2023-01-04 NOTE — Telephone Encounter (Signed)
I spoke with Shawna [name] at [facility] she says the patient will be getting her Octreotide at hemodialysis.

## 2023-01-10 NOTE — Telephone Encounter (Signed)
Per Milon Dikes with [employer]:  Shawna [name] at the pharmacy said he just spoke with the husband by phone.  The husband said that he mailed the information to [place] over the weekend.  If that is true, and the mail is not running slow, we should receive it this week.

## 2023-01-10 NOTE — Telephone Encounter (Signed)
Per [employer] patient assistance foundation they are still awaiting income information from the patient's spouse. I have sent a message to Milon Dikes with [employer] asked that he please follow up on this.

## 2023-01-16 NOTE — Telephone Encounter (Signed)
Per [employer] patient assistance program patient is approved for free Sandostatin [place].

## 2023-01-16 NOTE — Telephone Encounter (Signed)
I called and left a message for the patient spouse to call me to see if he had been notified and if patient is still in SNF.

## 2023-01-17 ENCOUNTER — Emergency Department (HOSPITAL_COMMUNITY): Payer: Medicare Other

## 2023-01-17 ENCOUNTER — Emergency Department (HOSPITAL_COMMUNITY)
Admission: EM | Admit: 2023-01-17 | Discharge: 2023-01-18 | Disposition: A | Discharge status: Home / Self Care | Payer: Medicare Other | Attending: Emergency Medicine | Admitting: Emergency Medicine

## 2023-01-17 ENCOUNTER — Encounter (HOSPITAL_COMMUNITY): Payer: Self-pay

## 2023-01-17 DIAGNOSIS — Z992 Dependence on renal dialysis: Secondary | ICD-10-CM | POA: Diagnosis not present

## 2023-01-17 DIAGNOSIS — D649 Anemia, unspecified: Secondary | ICD-10-CM | POA: Diagnosis not present

## 2023-01-17 DIAGNOSIS — I120 Hypertensive chronic kidney disease with stage 5 chronic kidney disease or end stage renal disease: Secondary | ICD-10-CM | POA: Insufficient documentation

## 2023-01-17 DIAGNOSIS — Z79899 Other long term (current) drug therapy: Secondary | ICD-10-CM | POA: Diagnosis not present

## 2023-01-17 DIAGNOSIS — Z1152 Encounter for screening for COVID-19: Secondary | ICD-10-CM | POA: Diagnosis not present

## 2023-01-17 DIAGNOSIS — R052 Subacute cough: Secondary | ICD-10-CM

## 2023-01-17 DIAGNOSIS — R600 Localized edema: Secondary | ICD-10-CM | POA: Diagnosis not present

## 2023-01-17 DIAGNOSIS — N186 End stage renal disease: Secondary | ICD-10-CM | POA: Insufficient documentation

## 2023-01-17 DIAGNOSIS — R0602 Shortness of breath: Secondary | ICD-10-CM | POA: Diagnosis present

## 2023-01-17 DIAGNOSIS — R051 Acute cough: Secondary | ICD-10-CM | POA: Insufficient documentation

## 2023-01-17 LAB — SARS CORONAVIRUS 2 BY RT PCR: SARS Coronavirus 2 by RT PCR: NEGATIVE

## 2023-01-17 NOTE — ED Triage Notes (Signed)
Pt is stating she is coming for a cough tonight that has been present for a bout 1 week, mentions that there has been phlegm present with her cough, and generally not feeling well.

## 2023-01-17 NOTE — Telephone Encounter (Signed)
I called and left a message asked that the patient spouse please return call.

## 2023-01-18 ENCOUNTER — Other Ambulatory Visit: Payer: Self-pay

## 2023-01-18 LAB — COMPREHENSIVE METABOLIC PANEL
ALT: 12 U/L (ref 0–44)
AST: 13 U/L — ABNORMAL LOW (ref 15–41)
Albumin: 2.8 g/dL — ABNORMAL LOW (ref 3.5–5.0)
Alkaline Phosphatase: 68 U/L (ref 38–126)
Anion gap: 12 (ref 5–15)
BUN: 13 mg/dL (ref 8–23)
CO2: 26 mmol/L (ref 22–32)
Calcium: 8.6 mg/dL — ABNORMAL LOW (ref 8.9–10.3)
Chloride: 93 mmol/L — ABNORMAL LOW (ref 98–111)
Creatinine, Ser: 4.98 mg/dL — ABNORMAL HIGH (ref 0.44–1.00)
GFR, Estimated: 8 mL/min — ABNORMAL LOW (ref >60)
Glucose, Bld: 84 mg/dL (ref 70–99)
Potassium: 3.2 mmol/L — ABNORMAL LOW (ref 3.5–5.1)
Sodium: 131 mmol/L — ABNORMAL LOW (ref 135–145)
Total Bilirubin: 0.9 mg/dL (ref 0.3–1.2)
Total Protein: 6.6 g/dL (ref 6.5–8.1)

## 2023-01-18 LAB — CBC WITH DIFFERENTIAL/PLATELET
Abs Immature Granulocytes: 0.03 10*3/uL (ref 0.00–0.07)
Basophils Absolute: 0.1 10*3/uL (ref 0.0–0.1)
Basophils Relative: 2 %
Eosinophils Absolute: 0.2 10*3/uL (ref 0.0–0.5)
Eosinophils Relative: 4 %
HCT: 24.9 % — ABNORMAL LOW (ref 36.0–46.0)
Hemoglobin: 7.8 g/dL — ABNORMAL LOW (ref 12.0–15.0)
Immature Granulocytes: 1 %
Lymphocytes Relative: 12 %
Lymphs Abs: 0.8 10*3/uL (ref 0.7–4.0)
MCH: 34.2 pg — ABNORMAL HIGH (ref 26.0–34.0)
MCHC: 31.3 g/dL (ref 30.0–36.0)
MCV: 109.2 fL — ABNORMAL HIGH (ref 80.0–100.0)
Monocytes Absolute: 0.8 10*3/uL (ref 0.1–1.0)
Monocytes Relative: 12 %
Neutro Abs: 4.5 10*3/uL (ref 1.7–7.7)
Neutrophils Relative %: 69 %
Platelets: 337 10*3/uL (ref 150–400)
RBC: 2.28 MIL/uL — ABNORMAL LOW (ref 3.87–5.11)
RDW: 25.3 % — ABNORMAL HIGH (ref 11.5–15.5)
WBC: 6.4 10*3/uL (ref 4.0–10.5)
nRBC: 0 % (ref 0.0–0.2)

## 2023-01-18 LAB — TROPONIN I (HIGH SENSITIVITY)
Troponin I (High Sensitivity): 46 ng/L — ABNORMAL HIGH (ref <18)
Troponin I (High Sensitivity): 42 ng/L — ABNORMAL HIGH (ref <18)

## 2023-01-18 MED ORDER — BENZONATATE 100 MG PO CAPS: 100.0000 mg | ORAL_CAPSULE | Freq: Three times a day (TID) | ORAL | 0 refills | Status: DC | PRN

## 2023-01-18 MED ORDER — METOPROLOL TARTRATE 25 MG PO TABS
25.0000 mg | ORAL_TABLET | Freq: Once | ORAL | Status: AC
  Administered 2023-01-18: 25 mg via ORAL
  Filled 2023-01-18: qty 1

## 2023-01-18 MED ORDER — HYDRALAZINE HCL 25 MG PO TABS
25.0000 mg | ORAL_TABLET | Freq: Once | ORAL | Status: AC
  Administered 2023-01-18: 25 mg via ORAL
  Filled 2023-01-18: qty 1

## 2023-01-18 MED ORDER — BENZONATATE 100 MG PO CAPS
100.0000 mg | ORAL_CAPSULE | Freq: Once | ORAL | Status: AC
  Administered 2023-01-18: 100 mg via ORAL
  Filled 2023-01-18: qty 1

## 2023-01-18 NOTE — Telephone Encounter (Signed)
Patient is currently inpatient (01/18/2023), not sure if she will be discharged home or back to SNF.  Per Milon Dikes with [employer], on 01/17/2023.  I think this may be the best way to get the drug for the patient. If she is still in hospital you may want to have it delivered to your office. Let me know how else I can help.   The number for PAP program is [phone] if anyone wants to reach out to them directly to schedule shipment of free drug.

## 2023-01-18 NOTE — Discharge Instructions (Signed)
Your Hemoglobin was 7.8 today.  Please follow up closely with your family doctor for recheck.    Get rechecked sooner if you develop fever, bleeding or new concerning symptoms.

## 2023-01-18 NOTE — ED Notes (Signed)
Ambulatory sats on RA 97-100%

## 2023-01-18 NOTE — ED Provider Notes (Signed)
[place] EMERGENCY DEPARTMENT AT [facility] Provider Note   CSN: [id number] Arrival date & time: 01/17/23  2133     History  Chief Complaint  Patient presents with   Cough    Shawna [name] is a 82 y.o. female.  The history is provided by the patient, the spouse and medical records.  Cough Shawna [name] is a 82 y.o. female who presents to the Emergency Department complaining of cough.  She states that 1 month ago she had pneumonia and since then she has experienced a persistent cough.  She has associated posttussive emesis.  Cough is productive of yellow sputum.  No associated fever.  She does get chest pain when she coughs.  She has mild shortness of breath.  She has a history of ESRD on hemodialysis Monday, Wednesday, Friday.  She has not had any issues with her dialysis sessions.  She does report some bilateral lower extremity edema that started today. She has a history of recurrent GI bleeds, no recent hematochezia or melena.  She was recently admitted for paroxysmal atrial fibrillation, found not to be a anticoagulation candidate given her history of recurrent GI bleeds.      Home Medications Prior to Admission medications   Medication Sig Start Date End Date Taking? Authorizing Provider  benzonatate (TESSALON) 100 MG capsule Take 1 capsule (100 mg total) by mouth 3 (three) times daily as needed for cough. 01/18/23  Yes Tilden Fossa, MD  ALPRAZolam Prudy Feeler) 0.5 MG tablet Take 1 tablet (0.5 mg total) by mouth daily as needed for up to 4 doses for anxiety. 12/19/22   Lanae Boast, MD  amiodarone (PACERONE) 200 MG tablet Take 200 mg by mouth daily. 11/21/22   [provider]  calcitRIOL (ROCALTROL) 0.25 MCG capsule Take 7 capsules (1.75 mcg total) by mouth every Monday, Wednesday, and Friday with hemodialysis. 12/21/22   Lanae Boast, MD  cinacalcet (SENSIPAR) 30 MG tablet Take 1 tablet (30 mg total) by mouth every Monday, Wednesday, and Friday with hemodialysis. 12/19/22    Lanae Boast, MD  cyclobenzaprine (FLEXERIL) 5 MG tablet Take 5 mg by mouth 3 (three) times daily as needed.    [provider]  folic acid (FOLVITE) 1 MG tablet Take 1 mg by mouth daily. 05/24/22   [provider]  gabapentin (NEURONTIN) 100 MG capsule Take 1 capsule (100 mg total) by mouth every evening. 12/13/22   Jerald Kief, MD  guaiFENesin-codeine 100-10 MG/5ML syrup Take 5 mLs by mouth every 4 (four) hours as needed for cough. 10/23/22   Jacalyn Lefevre, MD  hydrALAZINE (APRESOLINE) 25 MG tablet Take 1 tablet (25 mg total) by mouth 2 (two) times daily. Hold for sbp <110 12/27/22   Lanae Boast, MD  hydrOXYzine (ATARAX) 25 MG tablet Take 25 mg by mouth every 6 (six) hours as needed for itching. 06/06/22   [provider]  levETIRAcetam (KEPPRA) 500 MG tablet Take 1 tablet (500 mg total) by mouth daily. 11/15/22   Leroy Sea, MD  levothyroxine (SYNTHROID) 75 MCG tablet Take 1 tablet (75 mcg total) by mouth daily before breakfast. 08/14/22 12/02/22  Hughie Closs, MD  metoprolol tartrate (LOPRESSOR) 25 MG tablet Take 1 tablet (25 mg total) by mouth 2 (two) times daily. Hold for sbp <110 and hr < 60/min 12/27/22   Lanae Boast, MD  multivitamin (RENA-VIT) TABS tablet Take 1 tablet by mouth daily.    [provider]  nitroGLYCERIN (NITROSTAT) 0.4 MG SL tablet Place  0.4 mg under the tongue every 5 (five) minutes as needed for chest pain.    [provider]  octreotide (SANDOSTATIN LAR) 20 MG injection Inject 20 mg into the muscle every 28 (twenty-eight) days. 11/15/22   Leroy Sea, MD  omeprazole (PRILOSEC) 40 MG capsule Take 1 capsule (40 mg total) by mouth daily. 10/06/22   Aida Raider, NP  ondansetron (ZOFRAN-ODT) 4 MG disintegrating tablet Take 4 mg by mouth every 8 (eight) hours as needed for nausea or vomiting. 05/05/22   [provider]  rosuvastatin (CRESTOR) 10 MG tablet Take 1 tablet by mouth once daily 06/06/22   [place] Ouch,  [place] M, PA-C      Allergies    Amlodipine, Aspirin, Nitrofurantoin, Ranexa [ranolazine], Bactrim [sulfamethoxazole-trimethoprim], Contrast media [iodinated contrast media], Iron, Gabapentin, Iron sucrose, Sucroferric oxyhydroxide, Levaquin [levofloxacin in d5w], Pantoprazole, Plavix [clopidogrel bisulfate], Protonix [pantoprazole sodium], and Venofer [ferric oxide]    Review of Systems   Review of Systems  Respiratory:  Positive for cough.   All other systems reviewed and are negative.   Physical Exam Updated Vital Signs BP (!) 197/77   Pulse (!) 59   Temp 98.7 F (37.1 C) (Oral)   Resp 16   Ht 5\' 2"  (1.575 m)   Wt 51.7 kg   SpO2 100%   BMI 20.85 kg/m  Physical Exam Vitals and nursing note reviewed.  Constitutional:      Appearance: She is well-developed.  HENT:     Head: Normocephalic and atraumatic.  Cardiovascular:     Rate and Rhythm: Normal rate and regular rhythm.     Heart sounds: No murmur heard. Pulmonary:     Effort: Pulmonary effort is normal. No respiratory distress.     Breath sounds: Normal breath sounds.     Comments: Vas cath in left upper chest wall.  Abdominal:     Palpations: Abdomen is soft.     Tenderness: There is no abdominal tenderness. There is no guarding or rebound.  Musculoskeletal:        General: No tenderness.     Comments: 2+ DP pulses bilaterally.  2+ pitting edema to BLE  Skin:    General: Skin is warm and dry.  Neurological:     Mental Status: She is alert and oriented to person, place, and time.  Psychiatric:        Behavior: Behavior normal.     ED Results / Procedures / Treatments   Labs (all labs ordered are listed, but only abnormal results are displayed) Labs Reviewed  COMPREHENSIVE METABOLIC PANEL - Abnormal; Notable for the following components:      Result Value   Sodium 131 (*)    Potassium 3.2 (*)    Chloride 93 (*)    Creatinine, Ser 4.98 (*)    Calcium 8.6 (*)    Albumin 2.8 (*)    AST 13 (*)    GFR,  Estimated 8 (*)    All other components within normal limits  CBC WITH DIFFERENTIAL/PLATELET - Abnormal; Notable for the following components:   RBC 2.28 (*)    Hemoglobin 7.8 (*)    HCT 24.9 (*)    MCV 109.2 (*)    MCH 34.2 (*)    RDW 25.3 (*)    All other components within normal limits  TROPONIN I (HIGH SENSITIVITY) - Abnormal; Notable for the following components:   Troponin I (High Sensitivity) 46 (*)    All other components within normal limits  TROPONIN  I (HIGH SENSITIVITY) - Abnormal; Notable for the following components:   Troponin I (High Sensitivity) 42 (*)    All other components within normal limits  SARS CORONAVIRUS 2 BY RT PCR    EKG None  Radiology DG Chest 2 View  Result Date: 01/17/2023 CLINICAL DATA:  Cough x1 week. EXAM: CHEST - 2 VIEW COMPARISON:  December 27, 2022 FINDINGS: There is stable left-sided venous catheter positioning. Multiple sternal wires and vascular clips are present. The cardiac silhouette is mildly enlarged. A coronary artery stent is in place. Mild, diffuse, chronic appearing increased lung markings are seen with mild atelectasis and/or infiltrate noted within the left lung base. Very mild lateral right basilar atelectasis is also seen. A very small left pleural effusion is seen. No pneumothorax is identified. Multilevel degenerative changes are seen throughout the thoracic spine. IMPRESSION: 1. Evidence of prior median sternotomy/CABG. 2. Stable cardiomegaly with mild left basilar atelectasis and/or infiltrate. 3. Very small left pleural effusion. Electronically Signed   By: Aram Candela M.D.   On: 01/17/2023 22:22    Procedures Procedures    Medications Ordered in ED Medications  metoprolol tartrate (LOPRESSOR) tablet 25 mg (25 mg Oral Given 01/18/23 0144)  hydrALAZINE (APRESOLINE) tablet 25 mg (25 mg Oral Given 01/18/23 0144)  benzonatate (TESSALON) capsule 100 mg (100 mg Oral Given 01/18/23 0145)    ED Course/ Medical Decision Making/  A&P                             Medical Decision Making Amount and/or Complexity of Data Reviewed Labs: ordered. Radiology: ordered.  Risk Prescription drug management.   Patient with history of hypertension, paroxysmal atrial fibrillation not on anticoagulation, ESRD on hemodialysis, GI bleed here for evaluation of cough for the last month.  She states that her cough has not improved after being treated for pneumonia.  No reported bleeding.  She does receive outpatient blood transfusions as needed.  Chest x-ray with atelectasis versus infiltrate-no focal lung findings in this area, no active fevers, favor atelectasis over pneumonia at this time.  Hemoglobin is 7.8 today-no current active bleeding.  No persistent coughing during her ED stay.  Symptoms did improve after Tessalon.  Current picture is not consistent with PE, acute pneumonia, decompensated CHF, ACS.  Discussed home care for cough.  Feel she is stable for discharge for dialysis later today.  Discussed PCP follow-up regarding outpatient blood transfusion.  Discussed close return precautions for new or concerning symptoms.        Final Clinical Impression(s) / ED Diagnoses Final diagnoses:  Subacute cough  ESRD (end stage renal disease) on dialysis (HCC)  Anemia, unspecified type    Rx / DC Orders ED Discharge Orders          Ordered    benzonatate (TESSALON) 100 MG capsule  3 times daily PRN        01/18/23 0610              Tilden Fossa, MD 01/18/23 [phone]

## 2023-01-19 NOTE — Telephone Encounter (Signed)
I left a message with [facility] asked that the nurse for Dr. Leandrew Koyanagi please return my call.

## 2023-01-19 NOTE — Telephone Encounter (Signed)
[facility] Phone Number is [phone].

## 2023-01-19 NOTE — Telephone Encounter (Signed)
I called and left another message for the patient husband and asked that he please return call to the office. I also spoke with Windy Fast with [employer] and he says they will keep the case open for [place] until I let him know of the patient status. Last I seen patient was still in the hospital ans was to be discharged after HD.

## 2023-01-19 NOTE — Telephone Encounter (Signed)
Per Gerilyn Pilgrim with [employer] the patient is approved for free medication, but the patient lives to far from [place] to go there and get the shot provided by them and if they came here this would be giving them a free nurse visit which they can not do. They advised that we have the patient to call the drug company (number given to the spouse) for the free medication to set up shipment to their home, and since the patient already gets a b12 Q month with [facility] we will call [facility] Dr. Cato Mulligan office to see if the would be willing to administer the IM shot Q 28 days also.

## 2023-01-19 NOTE — Telephone Encounter (Signed)
I spoke with Albin Felling Dr. Leandrew Koyanagi nurse, I asked that she please see if Dr. Leandrew Koyanagi ok with giving patient her Sandostatin shot as the already give b12 shot monthly. She will call us back with the answer.

## 2023-01-20 ENCOUNTER — Telehealth (INDEPENDENT_AMBULATORY_CARE_PROVIDER_SITE_OTHER): Payer: Self-pay | Admitting: Gastroenterology

## 2023-01-20 NOTE — Telephone Encounter (Signed)
Called patient to make a follow up appointment per Dr Hinda Lenis with patients spouse-he stated they have so much going on right now they would rather make an appointment in August-patient is scheduled for 8/29.

## 2023-01-23 NOTE — Telephone Encounter (Signed)
Albin Felling from [facility] the patient primary care to report that they could not administer the Sandostatin. (The medication is free to the patient and spouse aware to call the patient assistance program to get shipped to their home). They had recommended that we have the dialysis unit to give, I have already checked with them and they are not allowed to administer. Please advised as to what we can do. Do you think you can talk with Dr. Leandrew Koyanagi to convince him to administer?

## 2023-01-24 NOTE — Telephone Encounter (Signed)
Thanks

## 2023-01-24 NOTE — Telephone Encounter (Signed)
Patient husband was given the phone number to the drug company to have them ship them medication to his home. I have left a message for the husband to see if he did this.

## 2023-01-24 NOTE — Telephone Encounter (Signed)
Per Albin Felling from Dr. Leandrew Koyanagi office they will administer the patient's Sandostatin. They need to know when it is due.    I have called the patient's home spoke with the patient and asked if she was aware if he had ordered the Sandostatin or not and she did not know. I asked that she please have her husband call me back.

## 2023-01-24 NOTE — Telephone Encounter (Signed)
Dawn pharmacist from [facility] called to get information regarding the patient free medication. I have sent her the approval letter from [employer] to [phone].

## 2023-01-24 NOTE — Telephone Encounter (Signed)
Thanks. I spoke yesterday with Dr. Leandrew Koyanagi he agreed to proceed with octreotide injection intramuscularly every month.  Patient reports he, they cannot start the medication for more than 24 hours so the patient brings the medication from home they can do with the same day or they only keep it for 24 hours. Is the medication being delivered to the patient's home or directly to the clinic?

## 2023-01-26 NOTE — Telephone Encounter (Signed)
FYI:  I spoke with the patient husband he says he has already called the patient assistance program and asked that the mail the medication to the patient home. He says he has spoken with Dawn the (pharmacist at Dr. Boone Master office) and they made him aware they would give the patient the b12 and the Sandostatin there at Dr. Cato Mulligan office. He says once he gets the medication he is aware to reach out to them so they can administer.

## 2023-01-27 ENCOUNTER — Encounter (HOSPITAL_COMMUNITY): Payer: Self-pay | Admitting: Emergency Medicine

## 2023-01-27 ENCOUNTER — Inpatient Hospital Stay (HOSPITAL_COMMUNITY)
Admission: EM | Admit: 2023-01-27 | Discharge: 2023-02-01 | DRG: 377 | Disposition: A | Discharge status: Home Health | Payer: Medicare Other | Source: Other Acute Inpatient Hospital | Attending: Internal Medicine | Admitting: Internal Medicine

## 2023-01-27 ENCOUNTER — Other Ambulatory Visit: Payer: Self-pay

## 2023-01-27 DIAGNOSIS — Z833 Family history of diabetes mellitus: Secondary | ICD-10-CM

## 2023-01-27 DIAGNOSIS — I2510 Atherosclerotic heart disease of native coronary artery without angina pectoris: Secondary | ICD-10-CM | POA: Diagnosis present

## 2023-01-27 DIAGNOSIS — F05 Delirium due to known physiological condition: Secondary | ICD-10-CM | POA: Diagnosis not present

## 2023-01-27 DIAGNOSIS — I480 Paroxysmal atrial fibrillation: Secondary | ICD-10-CM | POA: Diagnosis present

## 2023-01-27 DIAGNOSIS — E7800 Pure hypercholesterolemia, unspecified: Secondary | ICD-10-CM | POA: Diagnosis present

## 2023-01-27 DIAGNOSIS — D62 Acute posthemorrhagic anemia: Secondary | ICD-10-CM

## 2023-01-27 DIAGNOSIS — Z951 Presence of aortocoronary bypass graft: Secondary | ICD-10-CM

## 2023-01-27 DIAGNOSIS — Z881 Allergy status to other antibiotic agents status: Secondary | ICD-10-CM

## 2023-01-27 DIAGNOSIS — F419 Anxiety disorder, unspecified: Secondary | ICD-10-CM | POA: Diagnosis present

## 2023-01-27 DIAGNOSIS — K31811 Angiodysplasia of stomach and duodenum with bleeding: Secondary | ICD-10-CM | POA: Diagnosis present

## 2023-01-27 DIAGNOSIS — Z955 Presence of coronary angioplasty implant and graft: Secondary | ICD-10-CM

## 2023-01-27 DIAGNOSIS — K31819 Angiodysplasia of stomach and duodenum without bleeding: Secondary | ICD-10-CM | POA: Diagnosis present

## 2023-01-27 DIAGNOSIS — Z515 Encounter for palliative care: Secondary | ICD-10-CM

## 2023-01-27 DIAGNOSIS — I4892 Unspecified atrial flutter: Secondary | ICD-10-CM | POA: Diagnosis present

## 2023-01-27 DIAGNOSIS — I5042 Chronic combined systolic (congestive) and diastolic (congestive) heart failure: Secondary | ICD-10-CM | POA: Diagnosis present

## 2023-01-27 DIAGNOSIS — Z800 Family history of malignant neoplasm of digestive organs: Secondary | ICD-10-CM

## 2023-01-27 DIAGNOSIS — I2489 Other forms of acute ischemic heart disease: Secondary | ICD-10-CM | POA: Diagnosis present

## 2023-01-27 DIAGNOSIS — I132 Hypertensive heart and chronic kidney disease with heart failure and with stage 5 chronic kidney disease, or end stage renal disease: Secondary | ICD-10-CM | POA: Diagnosis present

## 2023-01-27 DIAGNOSIS — E039 Hypothyroidism, unspecified: Secondary | ICD-10-CM | POA: Diagnosis present

## 2023-01-27 DIAGNOSIS — N186 End stage renal disease: Secondary | ICD-10-CM | POA: Diagnosis present

## 2023-01-27 DIAGNOSIS — Z992 Dependence on renal dialysis: Secondary | ICD-10-CM

## 2023-01-27 DIAGNOSIS — I482 Chronic atrial fibrillation, unspecified: Secondary | ICD-10-CM

## 2023-01-27 DIAGNOSIS — K5521 Angiodysplasia of colon with hemorrhage: Principal | ICD-10-CM

## 2023-01-27 DIAGNOSIS — Z8249 Family history of ischemic heart disease and other diseases of the circulatory system: Secondary | ICD-10-CM

## 2023-01-27 DIAGNOSIS — F0390 Unspecified dementia without behavioral disturbance: Secondary | ICD-10-CM | POA: Diagnosis present

## 2023-01-27 DIAGNOSIS — Z79890 Hormone replacement therapy (postmenopausal): Secondary | ICD-10-CM

## 2023-01-27 DIAGNOSIS — Z90710 Acquired absence of both cervix and uterus: Secondary | ICD-10-CM

## 2023-01-27 DIAGNOSIS — E871 Hypo-osmolality and hyponatremia: Secondary | ICD-10-CM | POA: Diagnosis present

## 2023-01-27 DIAGNOSIS — Z882 Allergy status to sulfonamides status: Secondary | ICD-10-CM

## 2023-01-27 DIAGNOSIS — Z888 Allergy status to other drugs, medicaments and biological substances status: Secondary | ICD-10-CM

## 2023-01-27 DIAGNOSIS — Z8673 Personal history of transient ischemic attack (TIA), and cerebral infarction without residual deficits: Secondary | ICD-10-CM

## 2023-01-27 DIAGNOSIS — Z8543 Personal history of malignant neoplasm of ovary: Secondary | ICD-10-CM

## 2023-01-27 DIAGNOSIS — D649 Anemia, unspecified: Principal | ICD-10-CM

## 2023-01-27 DIAGNOSIS — Z5320 Procedure and treatment not carried out because of patient's decision for unspecified reasons: Secondary | ICD-10-CM | POA: Diagnosis present

## 2023-01-27 DIAGNOSIS — Z79899 Other long term (current) drug therapy: Secondary | ICD-10-CM

## 2023-01-27 DIAGNOSIS — G3184 Mild cognitive impairment, so stated: Secondary | ICD-10-CM | POA: Diagnosis present

## 2023-01-27 DIAGNOSIS — Z91158 Patient's noncompliance with renal dialysis for other reason: Secondary | ICD-10-CM

## 2023-01-27 DIAGNOSIS — Z8711 Personal history of peptic ulcer disease: Secondary | ICD-10-CM

## 2023-01-27 DIAGNOSIS — G40909 Epilepsy, unspecified, not intractable, without status epilepticus: Secondary | ICD-10-CM | POA: Diagnosis present

## 2023-01-27 DIAGNOSIS — Z85038 Personal history of other malignant neoplasm of large intestine: Secondary | ICD-10-CM

## 2023-01-27 DIAGNOSIS — E1122 Type 2 diabetes mellitus with diabetic chronic kidney disease: Secondary | ICD-10-CM | POA: Diagnosis present

## 2023-01-27 DIAGNOSIS — Z5329 Procedure and treatment not carried out because of patient's decision for other reasons: Secondary | ICD-10-CM | POA: Diagnosis present

## 2023-01-27 DIAGNOSIS — I4719 Other supraventricular tachycardia: Secondary | ICD-10-CM | POA: Diagnosis present

## 2023-01-27 DIAGNOSIS — D124 Benign neoplasm of descending colon: Secondary | ICD-10-CM | POA: Diagnosis present

## 2023-01-27 DIAGNOSIS — Z886 Allergy status to analgesic agent status: Secondary | ICD-10-CM

## 2023-01-27 DIAGNOSIS — Z91041 Radiographic dye allergy status: Secondary | ICD-10-CM

## 2023-01-27 DIAGNOSIS — Z7189 Other specified counseling: Secondary | ICD-10-CM | POA: Diagnosis not present

## 2023-01-27 DIAGNOSIS — Z83438 Family history of other disorder of lipoprotein metabolism and other lipidemia: Secondary | ICD-10-CM

## 2023-01-27 LAB — CBC WITH DIFFERENTIAL/PLATELET
Abs Immature Granulocytes: 0.1 10*3/uL — ABNORMAL HIGH (ref 0.00–0.07)
Basophils Absolute: 0.1 10*3/uL (ref 0.0–0.1)
Basophils Relative: 1 %
Eosinophils Absolute: 0 10*3/uL (ref 0.0–0.5)
Eosinophils Relative: 0 %
HCT: 20.6 % — ABNORMAL LOW (ref 36.0–46.0)
Hemoglobin: 6.4 g/dL — CL (ref 12.0–15.0)
Lymphocytes Relative: 5 %
Lymphs Abs: 0.3 10*3/uL — ABNORMAL LOW (ref 0.7–4.0)
MCH: 35.2 pg — ABNORMAL HIGH (ref 26.0–34.0)
MCHC: 31.1 g/dL (ref 30.0–36.0)
MCV: 113.2 fL — ABNORMAL HIGH (ref 80.0–100.0)
Metamyelocytes Relative: 1 %
Monocytes Absolute: 0.3 10*3/uL (ref 0.1–1.0)
Monocytes Relative: 5 %
Neutro Abs: 5.8 10*3/uL (ref 1.7–7.7)
Neutrophils Relative %: 88 %
Platelets: 310 10*3/uL (ref 150–400)
RBC: 1.82 MIL/uL — ABNORMAL LOW (ref 3.87–5.11)
RDW: 25.9 % — ABNORMAL HIGH (ref 11.5–15.5)
WBC: 6.6 10*3/uL (ref 4.0–10.5)
nRBC: 0 % (ref 0.0–0.2)

## 2023-01-27 LAB — COMPREHENSIVE METABOLIC PANEL
ALT: 10 U/L (ref 0–44)
AST: 8 U/L — ABNORMAL LOW (ref 15–41)
Albumin: 2.9 g/dL — ABNORMAL LOW (ref 3.5–5.0)
Alkaline Phosphatase: 83 U/L (ref 38–126)
Anion gap: 12 (ref 5–15)
BUN: 24 mg/dL — ABNORMAL HIGH (ref 8–23)
CO2: 25 mmol/L (ref 22–32)
Calcium: 8.2 mg/dL — ABNORMAL LOW (ref 8.9–10.3)
Chloride: 93 mmol/L — ABNORMAL LOW (ref 98–111)
Creatinine, Ser: 5.5 mg/dL — ABNORMAL HIGH (ref 0.44–1.00)
GFR, Estimated: 7 mL/min — ABNORMAL LOW (ref >60)
Glucose, Bld: 150 mg/dL — ABNORMAL HIGH (ref 70–99)
Potassium: 3.7 mmol/L (ref 3.5–5.1)
Sodium: 130 mmol/L — ABNORMAL LOW (ref 135–145)
Total Bilirubin: 1 mg/dL (ref 0.3–1.2)
Total Protein: 6.8 g/dL (ref 6.5–8.1)

## 2023-01-27 LAB — TYPE AND SCREEN

## 2023-01-27 LAB — IRON AND TIBC
Iron: 135 ug/dL (ref 28–170)
Saturation Ratios: 89 % — ABNORMAL HIGH (ref 10.4–31.8)
TIBC: 152 ug/dL — ABNORMAL LOW (ref 250–450)
UIBC: 17 ug/dL

## 2023-01-27 LAB — PREPARE RBC (CROSSMATCH)

## 2023-01-27 LAB — FERRITIN: Ferritin: 1469 ng/mL — ABNORMAL HIGH (ref 11–307)

## 2023-01-27 LAB — BPAM RBC: Unit Type and Rh: 5100

## 2023-01-27 MED ORDER — BENZONATATE 100 MG PO CAPS
100.0000 mg | ORAL_CAPSULE | Freq: Three times a day (TID) | ORAL | Status: DC | PRN
  Administered 2023-01-30: 100 mg via ORAL
  Filled 2023-01-27: qty 1

## 2023-01-27 MED ORDER — FOLIC ACID 1 MG PO TABS
1.0000 mg | ORAL_TABLET | Freq: Every day | ORAL | Status: DC
  Administered 2023-01-28 – 2023-02-01 (×5): 1 mg via ORAL
  Filled 2023-01-27 (×5): qty 1

## 2023-01-27 MED ORDER — ROSUVASTATIN CALCIUM 10 MG PO TABS
10.0000 mg | ORAL_TABLET | Freq: Every day | ORAL | Status: DC
  Administered 2023-01-28 – 2023-02-01 (×5): 10 mg via ORAL
  Filled 2023-01-27 (×5): qty 1

## 2023-01-27 MED ORDER — HYDRALAZINE HCL 25 MG PO TABS
25.0000 mg | ORAL_TABLET | Freq: Two times a day (BID) | ORAL | Status: DC
  Administered 2023-01-27 – 2023-02-01 (×8): 25 mg via ORAL
  Filled 2023-01-27 (×8): qty 1

## 2023-01-27 MED ORDER — GABAPENTIN 100 MG PO CAPS
100.0000 mg | ORAL_CAPSULE | Freq: Every evening | ORAL | Status: DC
  Administered 2023-01-27 – 2023-01-30 (×4): 100 mg via ORAL
  Filled 2023-01-27 (×4): qty 1

## 2023-01-27 MED ORDER — METOPROLOL TARTRATE 25 MG PO TABS
25.0000 mg | ORAL_TABLET | Freq: Two times a day (BID) | ORAL | Status: DC
  Filled 2023-01-27: qty 1

## 2023-01-27 MED ORDER — OCTREOTIDE ACETATE 100 MCG/ML IJ SOLN
100.0000 ug | Freq: Three times a day (TID) | INTRAMUSCULAR | Status: DC
  Administered 2023-01-28 – 2023-02-01 (×13): 100 ug via SUBCUTANEOUS
  Filled 2023-01-27 (×20): qty 1

## 2023-01-27 MED ORDER — CINACALCET HCL 30 MG PO TABS
30.0000 mg | ORAL_TABLET | ORAL | Status: DC
  Administered 2023-01-30 – 2023-02-01 (×2): 30 mg via ORAL
  Filled 2023-01-27: qty 1

## 2023-01-27 MED ORDER — NITROGLYCERIN 0.4 MG SL SUBL: 0.4000 mg | SUBLINGUAL_TABLET | SUBLINGUAL | Status: DC | PRN

## 2023-01-27 MED ORDER — HYDROXYZINE HCL 25 MG PO TABS: 25.0000 mg | ORAL_TABLET | Freq: Four times a day (QID) | ORAL | Status: DC | PRN

## 2023-01-27 MED ORDER — OCTREOTIDE ACETATE 100 MCG/ML IJ SOLN
100.0000 ug | Freq: Three times a day (TID) | INTRAMUSCULAR | Status: DC
  Administered 2023-01-27: 100 ug via SUBCUTANEOUS
  Filled 2023-01-27 (×10): qty 1

## 2023-01-27 MED ORDER — AMIODARONE HCL 200 MG PO TABS: 200.0000 mg | ORAL_TABLET | Freq: Every day | ORAL | Status: DC

## 2023-01-27 MED ORDER — ALPRAZOLAM 0.5 MG PO TABS
0.5000 mg | ORAL_TABLET | Freq: Every day | ORAL | Status: DC | PRN
  Administered 2023-01-29 – 2023-01-30 (×3): 0.5 mg via ORAL
  Filled 2023-01-27 (×3): qty 1

## 2023-01-27 MED ORDER — CHLORHEXIDINE GLUCONATE CLOTH 2 % EX PADS
6.0000 | MEDICATED_PAD | Freq: Every day | CUTANEOUS | Status: DC
  Administered 2023-01-28 – 2023-01-29 (×2): 6 via TOPICAL

## 2023-01-27 MED ORDER — LEVETIRACETAM 500 MG PO TABS
500.0000 mg | ORAL_TABLET | Freq: Every day | ORAL | Status: DC
  Administered 2023-01-28 – 2023-02-01 (×5): 500 mg via ORAL
  Filled 2023-01-27 (×4): qty 1

## 2023-01-27 MED ORDER — HYDRALAZINE HCL 20 MG/ML IJ SOLN
5.0000 mg | INTRAMUSCULAR | Status: DC | PRN
  Administered 2023-01-28 (×2): 5 mg via INTRAVENOUS
  Filled 2023-01-27 (×2): qty 1

## 2023-01-27 MED ORDER — CALCITRIOL 0.25 MCG PO CAPS
1.7500 ug | ORAL_CAPSULE | ORAL | Status: DC
  Administered 2023-01-30 – 2023-02-01 (×2): 1.75 ug via ORAL
  Filled 2023-01-27: qty 7

## 2023-01-27 MED ORDER — RENA-VITE PO TABS
1.0000 | ORAL_TABLET | Freq: Every day | ORAL | Status: DC
  Administered 2023-01-28 – 2023-02-01 (×5): 1 via ORAL
  Filled 2023-01-27 (×5): qty 1

## 2023-01-27 MED ORDER — SODIUM CHLORIDE 0.9% IV SOLUTION: Freq: Once | INTRAVENOUS | Status: DC

## 2023-01-27 MED ORDER — LEVOTHYROXINE SODIUM 75 MCG PO TABS
75.0000 ug | ORAL_TABLET | Freq: Every day | ORAL | Status: DC
  Administered 2023-01-28 – 2023-02-01 (×4): 75 ug via ORAL
  Filled 2023-01-27 (×4): qty 1

## 2023-01-27 MED ORDER — PANTOPRAZOLE SODIUM 40 MG PO TBEC: 40.0000 mg | DELAYED_RELEASE_TABLET | Freq: Every day | ORAL | Status: DC

## 2023-01-27 MED ORDER — OMEPRAZOLE 20 MG PO CPDR
20.0000 mg | DELAYED_RELEASE_CAPSULE | Freq: Two times a day (BID) | ORAL | Status: DC
  Filled 2023-01-27 (×6): qty 1

## 2023-01-27 NOTE — ED Provider Notes (Signed)
[place] EMERGENCY DEPARTMENT AT [facility] Provider Note  CSN: [id number] Arrival date & time: 01/27/23 1312  Chief Complaint(s) Abnormal Lab  HPI Shawna [name] is a 82 y.o. female with PMH HTN, HLD, CAD status post CABG and PCI, paroxysmal A-fib not on anticoagulation due to recurrent GI bleeding, ESRD on hemodialysis Monday Wednesday Friday, hypothyroidism who presents emergency department for evaluation of fatigue and dark stools.  Patient states that over the last 1 week she has noticed darker stools and rapidly progressive fatigue and exertional shortness of breath.  She went to dialysis today for routine dialysis and was found to have a hemoglobin of 6.1 where she was transferred to the emergency department for further evaluation.  Denies chest pain, abdominal pain, nausea, vomiting or other systemic symptoms.   Past Medical History Past Medical History:  Diagnosis Date   Acute on chronic respiratory failure with hypoxia (HCC) 10/10/2016   Anxiety    Arthritis    AVM (arteriovenous malformation) of colon    CAD (coronary artery disease)    a. s/p CABG in 2013 b. DES to D1 in 10/2016. c. cath in 07/2018 showing patent grafts with occlusion of D1 at prior stent site and progression of PDA disease --> medical management recommended   Carotid artery disease (HCC)    a. 60-79% LICA, 03/2012    Chronic bronchitis (HCC)    Chronic HFrEF (heart failure with reduced ejection fraction) (HCC)    Colon cancer (HCC) 1992   Esophageal stricture    ESRD on hemodialysis (HCC)    ESRD due to HTN, started dialysis 2011 and gets HD at [facility] with Dr Fausto Skillern on MWF schedule.  Access is LUA AVF as of Sept 2014.    GERD (gastroesophageal reflux disease)    High cholesterol 12/2011   History of blood transfusion 07/2011; 12/2011; 01/2012 X 2; 04/2012   History of gout    History of lower GI bleeding    Hypertension    Iron deficiency anemia    Jugular vein occlusion, right (HCC)     Mitral regurgitation    a. Moderate by echo, 02/2012   Mitral valve disease    NSVT (nonsustained ventricular tachycardia) (HCC)    Ovarian cancer (HCC) 1992   PAF (paroxysmal atrial fibrillation) (HCC)    Pneumonia ~ 2009   PUD (peptic ulcer disease)    TIA (transient ischemic attack)    Tricuspid valve disease    Patient Active Problem List   Diagnosis Date Noted   Paroxysmal atrial flutter (HCC) 12/14/2022   Pressure injury of skin 12/01/2022   Rectal bleeding 11/30/2022   Generalized abdominal pain 11/10/2022   Ileus (HCC) 11/04/2022   Periumbilical abdominal pain 11/04/2022   Macrocytic anemia 10/29/2022   Anemia of chronic disease 10/29/2022   Upper GI bleed 10/29/2022   Iron deficiency anemia due to chronic blood loss 08/26/2022   Benign neoplasm of transverse colon 08/11/2022   Benign neoplasm of descending colon 08/11/2022   Benign neoplasm of sigmoid colon 08/11/2022   Angiodysplasia of duodenum 08/11/2022   Delirium 04/22/2022   History of arteriovenous malformation (AVM) 04/22/2022   Acute metabolic encephalopathy 11/09/2021   NSTEMI, initial episode of care (HCC) 10/19/2021   Acute on chronic blood loss anemia 09/09/2021   Ischemic cardiomyopathy    Atrial fibrillation, chronic (HCC) 05/07/2021   Neuropathy 04/23/2021   Anemia associated with chronic renal failure 04/13/2021   Left knee pain 03/15/2021   Moderate protein-calorie malnutrition (HCC)  02/27/2021   Hypokalemia 02/27/2021   COVID-19 virus infection 02/03/2021   Leukocytosis 02/03/2021   Acquired hypothyroidism 02/03/2021   Myositis 12/03/2020   Ataxia 12/02/2020   Diabetes mellitus type 2 in nonobese (HCC) 11/10/2020   Failure to thrive in adult 11/09/2020   Dialysis AV fistula malfunction, initial encounter (HCC)    Jugular vein occlusion, right (HCC)    Failure of surgically constructed arteriovenous fistula (HCC) 10/03/2020   Myoclonus 08/31/2020   Clotted renal dialysis AV graft, initial  encounter (HCC)    Hemodialysis-associated hypotension    Irritable bowel syndrome 02/25/2020   Adenomatous duodenal polyp 09/10/2019   History of GI bleed 09/10/2019   Hand steal syndrome (HCC) 08/01/2017   CAD (coronary artery disease) 06/05/2017   Intestinal ischemia (HCC)    Complication of vascular access for dialysis 03/19/2017   Preoperative clearance 01/25/2017   H/O non-ST elevation myocardial infarction (NSTEMI) 10/24/2016   Non-ST elevation (NSTEMI) myocardial infarction [facility])    Heme positive stool    Cardiac arrest [facility])    Palliative care encounter    Goals of care, counseling/discussion    Flash pulmonary edema (HCC) 04/06/2016   History of colon cancer 01/27/2016   History of ovarian cancer 01/27/2016   PAF (paroxysmal atrial fibrillation) (HCC) 10/14/2015   Malignant neoplasm of right ovary (HCC) 10/14/2015   Wide-complex tachycardia 09/08/2015   SVT (supraventricular tachycardia) 09/08/2015   Essential hypertension    Dyspnea    Constipation 03/12/2013   Abdominal wall pain in left flank 03/12/2013   Occlusion and stenosis of carotid artery without mention of cerebral infarction 01/24/2013   Hx of CABG 07/05/2012   Carotid artery disease (HCC) 07/05/2012   Mitral regurgitation 06/12/2012   Non-STEMI (non-ST elevated myocardial infarction) (HCC) 06/08/2012   Chronic combined systolic and diastolic CHF (congestive heart failure) (HCC) 05/02/2012   AVM (arteriovenous malformation) of small bowel, acquired with hemorrhage 01/20/2012   GERD (gastroesophageal reflux disease) 01/09/2012   Mixed hyperlipidemia 01/05/2012   Atherosclerotic heart disease of native coronary artery without angina pectoris 12/16/2011   Anxiety disorder 05/04/2011   End-stage renal disease on hemodialysis (HCC) 04/29/2011   Gout 04/29/2011   Home Medication(s) Prior to Admission medications   Medication Sig Start Date End Date Taking? Authorizing Provider  ALPRAZolam Prudy Feeler) 0.5 MG  tablet Take 1 tablet (0.5 mg total) by mouth daily as needed for up to 4 doses for anxiety. 12/19/22   Lanae Boast, MD  amiodarone (PACERONE) 200 MG tablet Take 200 mg by mouth daily. 11/21/22   [provider]  benzonatate (TESSALON) 100 MG capsule Take 1 capsule (100 mg total) by mouth 3 (three) times daily as needed for cough. 01/18/23   Tilden Fossa, MD  calcitRIOL (ROCALTROL) 0.25 MCG capsule Take 7 capsules (1.75 mcg total) by mouth every Monday, Wednesday, and Friday with hemodialysis. 12/21/22   Lanae Boast, MD  cinacalcet (SENSIPAR) 30 MG tablet Take 1 tablet (30 mg total) by mouth every Monday, Wednesday, and Friday with hemodialysis. 12/19/22   Lanae Boast, MD  cyclobenzaprine (FLEXERIL) 5 MG tablet Take 5 mg by mouth 3 (three) times daily as needed.    [provider]  folic acid (FOLVITE) 1 MG tablet Take 1 mg by mouth daily. 05/24/22   [provider]  gabapentin (NEURONTIN) 100 MG capsule Take 1 capsule (100 mg total) by mouth every evening. 12/13/22   Jerald Kief, MD  guaiFENesin-codeine 100-10 MG/5ML syrup Take 5 mLs by mouth every 4 (four) hours  as needed for cough. 10/23/22   Jacalyn Lefevre, MD  hydrALAZINE (APRESOLINE) 25 MG tablet Take 1 tablet (25 mg total) by mouth 2 (two) times daily. Hold for sbp <110 12/27/22   Lanae Boast, MD  hydrOXYzine (ATARAX) 25 MG tablet Take 25 mg by mouth every 6 (six) hours as needed for itching. 06/06/22   [provider]  levETIRAcetam (KEPPRA) 500 MG tablet Take 1 tablet (500 mg total) by mouth daily. 11/15/22   Leroy Sea, MD  levothyroxine (SYNTHROID) 75 MCG tablet Take 1 tablet (75 mcg total) by mouth daily before breakfast. 08/14/22 12/02/22  Hughie Closs, MD  metoprolol tartrate (LOPRESSOR) 25 MG tablet Take 1 tablet (25 mg total) by mouth 2 (two) times daily. Hold for sbp <110 and hr < 60/min 12/27/22   Lanae Boast, MD  multivitamin (RENA-VIT) TABS tablet Take 1 tablet by mouth daily.    [provider]  nitroGLYCERIN (NITROSTAT) 0.4 MG SL tablet Place 0.4 mg under the tongue every 5 (five) minutes as needed for chest pain.    [provider]  octreotide (SANDOSTATIN LAR) 20 MG injection Inject 20 mg into the muscle every 28 (twenty-eight) days. 11/15/22   Leroy Sea, MD  omeprazole (PRILOSEC) 40 MG capsule Take 1 capsule (40 mg total) by mouth daily. 10/06/22   Aida Raider, NP  ondansetron (ZOFRAN-ODT) 4 MG disintegrating tablet Take 4 mg by mouth every 8 (eight) hours as needed for nausea or vomiting. 05/05/22   [provider]  rosuvastatin (CRESTOR) 10 MG tablet Take 1 tablet by mouth once daily 06/06/22   Iran Ouch Lennart Pall, PA-C                                                                                                                                    Past Surgical History Past Surgical History:  Procedure Laterality Date   A/V FISTULAGRAM Left 10/04/2022   Procedure: A/V Fistulagram;  Surgeon: Renford Dills, MD;  Location: [facility] INVASIVE CV LAB;  Service: Cardiovascular;  Laterality: Left;   A/V SHUNTOGRAM Left 03/19/2019   Procedure: A/V SHUNTOGRAM;  Surgeon: Renford Dills, MD;  Location: [facility] INVASIVE CV LAB;  Service: Cardiovascular;  Laterality: Left;   ABDOMINAL HYSTERECTOMY  1992   APPENDECTOMY  06/1990   AV FISTULA PLACEMENT  07/2009   left upper arm   AV FISTULA PLACEMENT Right 09/06/2016   Procedure: RIGHT FOREARM ARTERIOVENOUS (AV) GRAFT;  Surgeon: Sherren Kerns, MD;  [facility]: [facility] OR;  Service: Vascular;  Laterality: Right;   AV FISTULA PLACEMENT N/A 02/24/2017   Procedure: INSERTION OF ARTERIOVENOUS (AV) GORE-TEX GRAFT ARM (BRACHIAL AXILLARY);  Surgeon: Renford Dills, MD;  Location: [facility] ORS;  Service: Vascular;  Laterality: N/A;   AVGG REMOVAL Right 09/06/2016   Procedure: REMOVAL OF Right Arm ARTERIOVENOUS GORETEX GRAFT and Vein Patch angioplasty of brachial artery;  Surgeon: Chuck Hint, MD;  [facility] OR;   Service: Vascular;  Laterality: Right;   BIOPSY  09/26/2019   Procedure: BIOPSY;  Surgeon: Malissa Hippo, MD;  [facility]: [facility] ENDO [facility];  Service: Endoscopy;;   BIOPSY  11/01/2022   Procedure: BIOPSY;  Surgeon: Dolores Frame, MD;  [facility];  Service: Gastroenterology;;   COLON RESECTION  1992   COLON SURGERY     COLONOSCOPY N/A 03/09/2019   Procedure: COLONOSCOPY;  Surgeon: Malissa Hippo, MD;  [facility];  Service: Endoscopy;  Laterality: N/A;   COLONOSCOPY N/A 08/11/2022   Procedure: COLONOSCOPY;  Surgeon: Beverley Fiedler, MD;  Location: [facility] ENDOSCOPY;  Service: Gastroenterology;  Laterality: N/A;   COLONOSCOPY WITH PROPOFOL N/A 06/08/2021   Procedure: COLONOSCOPY WITH PROPOFOL;  Surgeon: Dolores Frame, MD;  [facility];  Service: Gastroenterology;  Laterality: N/A;  9:05 /Patient is on dialysis Mon Wed Fri   CORONARY ANGIOPLASTY WITH STENT PLACEMENT  12/15/11   "2"   CORONARY ANGIOPLASTY WITH STENT PLACEMENT  y/2013   "1; makes total of 3" (05/02/2012)   CORONARY ARTERY BYPASS GRAFT  06/13/2012   Procedure: CORONARY ARTERY BYPASS GRAFTING (CABG);  Surgeon: Delight Ovens, MD;  Location: [facility] OR;  Service: Open Heart Surgery;  Laterality: N/A;  cabg x four;  using left internal mammary artery, and left leg greater saphenous vein harvested endoscopically   CORONARY STENT INTERVENTION N/A 10/13/2016   Procedure: Coronary Stent Intervention;  Surgeon: Lennette Bihari, MD;  [facility];  Service: Cardiovascular;  Laterality: N/A;   DIALYSIS/PERMA CATHETER REMOVAL N/A 04/18/2017   Procedure: DIALYSIS/PERMA CATHETER REMOVAL;  Surgeon: Renford Dills, MD;  Location: [facility] INVASIVE CV LAB;  Service: Cardiovascular;  Laterality: N/A;   DILATION AND CURETTAGE OF UTERUS     ENTEROSCOPY N/A 06/08/2021   Procedure: PUSH ENTEROSCOPY;  Surgeon: Dolores Frame, MD;  [facility];  Service: Gastroenterology;   Laterality: N/A;   ENTEROSCOPY N/A 11/01/2022   Procedure: ENTEROSCOPY;  Surgeon: Dolores Frame, MD;  [facility];  Service: Gastroenterology;  Laterality: N/A;   ENTEROSCOPY N/A 12/01/2022   Procedure: ENTEROSCOPY;  Surgeon: Benancio Deeds, MD;  Location: [facility] ENDOSCOPY;  Service: Gastroenterology;  Laterality: N/A;   ESOPHAGOGASTRODUODENOSCOPY  01/20/2012   Procedure: ESOPHAGOGASTRODUODENOSCOPY (EGD);  Surgeon: Meryl Dare, MD,FACG;  Location: [facility] ENDOSCOPY;  Service: Endoscopy;  Laterality: N/A;   ESOPHAGOGASTRODUODENOSCOPY N/A 03/26/2013   Procedure: ESOPHAGOGASTRODUODENOSCOPY (EGD);  Surgeon: Hilarie Fredrickson, MD;  Location: [facility] ENDOSCOPY;  Service: Endoscopy;  Laterality: N/A;   ESOPHAGOGASTRODUODENOSCOPY N/A 04/30/2015   Procedure: ESOPHAGOGASTRODUODENOSCOPY (EGD);  Surgeon: Malissa Hippo, MD;  [facility];  Service: Endoscopy;  Laterality: N/A;  1pm - moved to 10/20 @ 1:10   ESOPHAGOGASTRODUODENOSCOPY N/A 07/29/2016   Procedure: ESOPHAGOGASTRODUODENOSCOPY (EGD);  Surgeon: Ruffin Frederick, MD;  Location: [facility] ENDOSCOPY;  Service: Gastroenterology;  Laterality: N/A;  enteroscopy   ESOPHAGOGASTRODUODENOSCOPY N/A 09/26/2019   Procedure: ESOPHAGOGASTRODUODENOSCOPY (EGD);  Surgeon: Malissa Hippo, MD;  [facility];  Service: Endoscopy;  Laterality: N/A;  1250   ESOPHAGOGASTRODUODENOSCOPY N/A 08/11/2022   Procedure: ESOPHAGOGASTRODUODENOSCOPY (EGD);  Surgeon: Beverley Fiedler, MD;  Location: [facility] ENDOSCOPY;  Service: Gastroenterology;  Laterality: N/A;   ESOPHAGOGASTRODUODENOSCOPY (EGD) WITH PROPOFOL N/A 02/05/2021   Procedure: ESOPHAGOGASTRODUODENOSCOPY (EGD) WITH PROPOFOL;  Surgeon: Lanelle Bal, DO;  [facility];  Service: Endoscopy;  Laterality: N/A;   ESOPHAGOGASTRODUODENOSCOPY (EGD) WITH PROPOFOL N/A 08/27/2022  Procedure: ESOPHAGOGASTRODUODENOSCOPY (EGD) WITH PROPOFOL;  Surgeon: Lanelle Bal, DO;  [facility];   Service: Endoscopy;  Laterality: N/A;   GIVENS CAPSULE STUDY N/A 03/07/2019   Procedure: GIVENS CAPSULE STUDY;  Surgeon: Malissa Hippo, MD;  [facility];  Service: Endoscopy;  Laterality: N/A;  7:30   GIVENS CAPSULE STUDY N/A 04/22/2021   Procedure: GIVENS CAPSULE STUDY;  Surgeon: Malissa Hippo, MD;  [facility];  Service: Endoscopy;  Laterality: N/A;  7:30   GIVENS CAPSULE STUDY N/A 08/27/2022   Procedure: GIVENS CAPSULE STUDY;  Surgeon: Lanelle Bal, DO;  [facility];  Service: Endoscopy;  Laterality: N/A;   HOT HEMOSTASIS N/A 08/11/2022   Procedure: HOT HEMOSTASIS (ARGON PLASMA COAGULATION/BICAP);  Surgeon: Beverley Fiedler, MD;  Location: [facility] ENDOSCOPY;  Service: Gastroenterology;  Laterality: N/A;   HOT HEMOSTASIS N/A 12/01/2022   Procedure: HOT HEMOSTASIS (ARGON PLASMA COAGULATION/BICAP);  Surgeon: Benancio Deeds, MD;  Location: [facility] ENDOSCOPY;  Service: Gastroenterology;  Laterality: N/A;   INSERTION OF DIALYSIS CATHETER N/A 10/05/2020   Procedure: ABORTED TUNNELED DIALYSIS CATHETER PLACEMENT RIGHT INTERNAL JUGULAR VEIN ;  Surgeon: Lucretia Roers, MD;  Location: [facility] ORS;  Service: General;  Laterality: N/A;   INTRAOPERATIVE TRANSESOPHAGEAL ECHOCARDIOGRAM  06/13/2012   Procedure: INTRAOPERATIVE TRANSESOPHAGEAL ECHOCARDIOGRAM;  Surgeon: Delight Ovens, MD;  Location: [facility] OR;  Service: Open Heart Surgery;  Laterality: N/A;   IR DIALY SHUNT INTRO NEEDLE/INTRACATH INITIAL W/IMG LEFT Left 10/06/2020   IR FLUORO GUIDE CV LINE RIGHT  06/17/2020   IR FLUORO GUIDE CV LINE RIGHT  11/12/2022   IR GENERIC HISTORICAL  07/26/2016   IR FLUORO GUIDE CV LINE RIGHT 07/26/2016 Berdine Dance, MD [facility]   IR GENERIC HISTORICAL  07/26/2016   IR US GUIDE VASC ACCESS RIGHT 07/26/2016 Berdine Dance, MD [facility]   IR GENERIC HISTORICAL  08/02/2016   IR US GUIDE VASC ACCESS RIGHT 08/02/2016 Berdine Dance, MD [facility]   IR GENERIC HISTORICAL  08/02/2016   IR  FLUORO GUIDE CV LINE RIGHT 08/02/2016 Berdine Dance, MD [facility]   IR RADIOLOGY PERIPHERAL GUIDED IV START  03/28/2017   IR REMOVAL TUN CV CATH W/O FL  08/11/2020   IR REMOVAL TUN CV CATH W/O FL  11/15/2022   IR THROMBECTOMY AV FISTULA W/THROMBOLYSIS INC/SHUNT/IMG LEFT Left 06/17/2020   IR US GUIDE VASC ACCESS LEFT  06/17/2020   IR US GUIDE VASC ACCESS RIGHT  03/28/2017   IR US GUIDE VASC ACCESS RIGHT  06/17/2020   IR US GUIDE VASC ACCESS RIGHT  11/12/2022   LEFT HEART CATH AND CORONARY ANGIOGRAPHY N/A 09/20/2016   Procedure: Left Heart Cath and Coronary Angiography;  Surgeon: Lyn Records, MD;  Location: [facility] INVASIVE CV LAB;  Service: Cardiovascular;  Laterality: N/A;   LEFT HEART CATH AND CORS/GRAFTS ANGIOGRAPHY N/A 10/13/2016   Procedure: Left Heart Cath and Cors/Grafts Angiography;  Surgeon: Lennette Bihari, MD;  [facility] LAB;  Service: Cardiovascular;  Laterality: N/A;   LEFT HEART CATH AND CORS/GRAFTS ANGIOGRAPHY N/A 07/13/2018   Procedure: LEFT HEART CATH AND CORS/GRAFTS ANGIOGRAPHY;  Surgeon: Swaziland, Peter M, MD;  Location: [facility] INVASIVE CV LAB;  Service: Cardiovascular;  Laterality: N/A;   LEFT HEART CATH AND CORS/GRAFTS ANGIOGRAPHY N/A 07/22/2021   Procedure: LEFT HEART CATH AND CORS/GRAFTS ANGIOGRAPHY;  Surgeon: Runell Gess, MD;  [facility]: [facility] INVAS[facility] CV LAB;  Service: Cardiovascular;  Laterality: N/A;   LEFT HEART CATHETERIZATION WITH CORONARY  ANGIOGRAM N/A 12/15/2011   Procedure: LEFT HEART CATHETERIZATION WITH CORONARY ANGIOGRAM;  Surgeon: Kathleene Hazel, MD;  Location: [facility] CATH LAB;  Service: Cardiovascular;  Laterality: N/A;   LEFT HEART CATHETERIZATION WITH CORONARY ANGIOGRAM N/A 01/10/2012   Procedure: LEFT HEART CATHETERIZATION WITH CORONARY ANGIOGRAM;  Surgeon: Peter M Swaziland, MD;  Location: [facility] CATH LAB;  Service: Cardiovascular;  Laterality: N/A;   LEFT HEART CATHETERIZATION WITH CORONARY ANGIOGRAM N/A 06/08/2012   Procedure: LEFT HEART CATHETERIZATION WITH  CORONARY ANGIOGRAM;  Surgeon: Kathleene Hazel, MD;  Location: [facility] CATH LAB;  Service: Cardiovascular;  Laterality: N/A;   LEFT HEART CATHETERIZATION WITH CORONARY/GRAFT ANGIOGRAM N/A 12/10/2013   Procedure: LEFT HEART CATHETERIZATION WITH Isabel Caprice;  Surgeon: Corky Crafts, MD;  Location: [facility] CATH LAB;  Service: Cardiovascular;  Laterality: N/A;   OVARY SURGERY     ovarian cancer   POLYPECTOMY  03/09/2019   Procedure: POLYPECTOMY;  Surgeon: Malissa Hippo, MD;  [facility];  Service: Endoscopy;;  cecal    POLYPECTOMY N/A 09/26/2019   Procedure: DUODENAL POLYPECTOMY;  Surgeon: Malissa Hippo, MD;  [facility];  Service: Endoscopy;  Laterality: N/A;   POLYPECTOMY  08/11/2022   Procedure: POLYPECTOMY;  Surgeon: Beverley Fiedler, MD;  Location: [facility] ENDOSCOPY;  Service: Gastroenterology;;   REVISION OF ARTERIOVENOUS GORETEX GRAFT N/A 02/24/2017   Procedure: REVISION OF ARTERIOVENOUS GORETEX GRAFT (RESECTION);  Surgeon: Renford Dills, MD;  Location: [facility] ORS;  Service: Vascular;  Laterality: N/A;   REVISON OF ARTERIOVENOUS FISTULA Left 06/19/2020   Procedure: REVISION OF LEFT UPPER ARM AV GRAFT WITH INTERPOSITION JUMP GRAFT USING GORE LIMB;  Surgeon: Cephus Shelling, MD;  Location: [facility] OR;  Service: Vascular;  Laterality: Left;   SHUNTOGRAM N/A 10/15/2013   Procedure: Fistulogram;  Surgeon: Nada Libman, MD;  Location: [facility] CATH LAB;  Service: Cardiovascular;  Laterality: N/A;   SUBMUCOSAL TATTOO INJECTION  11/01/2022   Procedure: SUBMUCOSAL TATTOO INJECTION;  Surgeon: Dolores Frame, MD;  [facility];  Service: Gastroenterology;;   THROMBECTOMY / ARTERIOVENOUS GRAFT REVISION  2011   left upper arm   TUBAL LIGATION  1980's   UPPER EXTREMITY ANGIOGRAPHY Bilateral 12/06/2016   Procedure: Upper Extremity Angiography;  Surgeon: Renford Dills, MD;  Location: [facility] INVASIVE CV LAB;  Service: Cardiovascular;  Laterality:  Bilateral;   UPPER EXTREMITY INTERVENTION Left 06/06/2017   Procedure: UPPER EXTREMITY INTERVENTION;  Surgeon: Renford Dills, MD;  Location: [facility] INVASIVE CV LAB;  Service: Cardiovascular;  Laterality: Left;   UPPER EXTREMITY VENOGRAPHY Left 10/04/2022   Procedure: UPPER EXTREMITY VENOGRAPHY;  Surgeon: Renford Dills, MD;  Location: [facility] INVASIVE CV LAB;  Service: Cardiovascular;  Laterality: Left;   Family History Family History  Problem Relation Age of Onset   Heart disease Mother        Heart Disease before age 83   Hyperlipidemia Mother    Hypertension Mother    Diabetes Mother    Heart attack Mother    Heart disease Father        Heart Disease before age 58   Hyperlipidemia Father    Hypertension Father    Diabetes Father    Diabetes Sister    Hypertension Sister    Diabetes Brother    Hyperlipidemia Brother    Heart attack Brother    Hypertension Sister    Heart attack Brother    Colon cancer Child 62   Other Other  noncontributory for early CAD   Esophageal cancer Neg Hx    Liver disease Neg Hx    Kidney disease Neg Hx    Colon polyps Neg Hx     Social History Social History   Tobacco Use   Smoking status: Never   Smokeless tobacco: Never  Vaping Use   Vaping status: Never Used  Substance Use Topics   Alcohol use: No    Alcohol/week: 0.0 standard drinks of alcohol   Drug use: No   Allergies Amlodipine, Aspirin, Nitrofurantoin, Ranexa [ranolazine], Bactrim [sulfamethoxazole-trimethoprim], Contrast media [iodinated contrast media], Iron, Gabapentin, Iron sucrose, Sucroferric oxyhydroxide, Levaquin [levofloxacin in d5w], Pantoprazole, Plavix [clopidogrel bisulfate], Protonix [pantoprazole sodium], and Venofer [ferric oxide]  Review of Systems Review of Systems  Constitutional:  Positive for fatigue.  Respiratory:  Positive for shortness of breath.   Gastrointestinal:  Positive for blood in stool.    Physical Exam Vital Signs  I have  reviewed the triage vital signs BP (!) 151/62   Pulse (!) 51   Temp 98.2 F (36.8 C) (Oral)   Resp 14   Ht 5\' 2"  (1.575 m)   Wt 51.7 kg   SpO2 100%   BMI 20.85 kg/m   Physical Exam Vitals and nursing note reviewed.  Constitutional:      General: She is not in acute distress.    Appearance: She is well-developed. She is ill-appearing.  HENT:     Head: Normocephalic and atraumatic.  Eyes:     Conjunctiva/sclera: Conjunctivae normal.  Cardiovascular:     Rate and Rhythm: Normal rate and regular rhythm.     Heart sounds: No murmur heard. Pulmonary:     Effort: Pulmonary effort is normal. No respiratory distress.     Breath sounds: Normal breath sounds.  Abdominal:     Palpations: Abdomen is soft.     Tenderness: There is no abdominal tenderness.  Musculoskeletal:        General: No swelling.     Cervical back: Neck supple.  Skin:    General: Skin is warm and dry.     Capillary Refill: Capillary refill takes less than 2 seconds.  Neurological:     Mental Status: She is alert.  Psychiatric:        Mood and Affect: Mood normal.     ED Results and Treatments Labs (all labs ordered are listed, but only abnormal results are displayed) Labs Reviewed  CBC WITH DIFFERENTIAL/PLATELET - Abnormal; Notable for the following components:      Result Value   RBC 1.82 (*)    Hemoglobin 6.4 (*)    HCT 20.6 (*)    MCV 113.2 (*)    MCH 35.2 (*)    RDW 25.9 (*)    Lymphs Abs 0.3 (*)    Abs Immature Granulocytes 0.10 (*)    All other components within normal limits  COMPREHENSIVE METABOLIC PANEL - Abnormal; Notable for the following components:   Sodium 130 (*)    Chloride 93 (*)    Glucose, Bld 150 (*)    BUN 24 (*)    Creatinine, Ser 5.50 (*)    Calcium 8.2 (*)    Albumin 2.9 (*)    AST 8 (*)    GFR, Estimated 7 (*)    All other components within normal limits  IRON AND TIBC  FERRITIN  TYPE AND SCREEN  PREPARE RBC (CROSSMATCH)  Radiology No results found.  Pertinent labs & imaging results that were available during my care of the patient were reviewed by me and considered in my medical decision making (see MDM for details).  Medications Ordered in ED Medications  0.9 %  sodium chloride infusion (Manually program via Guardrails IV Fluids) (has no administration in time range)  octreotide (SANDOSTATIN) injection 100 mcg (has no administration in time range)                                                                                                                                     Procedures .Critical Care  Performed by: Glendora Score, MD Authorized by: Glendora Score, MD   Critical care provider statement:    Critical care time (minutes):  30   Critical care was necessary to treat or prevent imminent or life-threatening deterioration of the following conditions: Anemia requiring blood transfusion.   Critical care was time spent personally by me on the following activities:  Development of treatment plan with patient or surrogate, discussions with consultants, evaluation of patient's response to treatment, examination of patient, ordering and review of laboratory studies, ordering and review of radiographic studies, ordering and performing treatments and interventions, pulse oximetry, re-evaluation of patient's condition and review of old charts Ultrasound ED Peripheral IV (Provider)  Date/Time: 01/27/2023 5:36 PM  Performed by: Glendora Score, MD Authorized by: Glendora Score, MD   Procedure details:    Indications: multiple failed IV attempts     Skin Prep: chlorhexidine gluconate     Location:  Right AC   Angiocath:  20 G   Bedside Ultrasound Guided: Yes     Images: not archived     Patient tolerated procedure without complications: Yes     Dressing applied: Yes     (including critical care time)  Medical  Decision Making / ED Course   This patient presents to the ED for concern of fatigue, anemia, dark stools, this involves an extensive number of treatment options, and is a complaint that carries with it a high risk of complications and morbidity.  The differential diagnosis includes upper GI bleed including PUD, varices, boerhaave's, Mallory Weiss tear, aortoenteric fistula versus lower GI bleed including mass, diverticulosis/diverticulitis, hemorrhoids, anal fissure, mesenteric ischemia, aortoenteric fistula, colitis, decreased EPO production  MDM: Patient seen emerged part for evaluation of fatigue and anemia.  Physical exam with generalized pallor but is otherwise unremarkable.  Laboratory evaluation with a hemoglobin of 6.4 with an MCV of 113.2.  2 units packed red blood cells ordered.  Sodium 130, chloride 93, BUN 24, creatinine 5.50 consistent with her ESRD on hemodialysis.  I spoke with the nephrologist on-call Dr. Ronalee Belts who will help arrange inpatient dialysis likely tomorrow.  Spoke with Dr. Levon Hedger of gastroenterology who states that patient's bleeding is usually from AVMs and she has been unable to get her outpatient octreotide.  Recommending 3 times daily subcutaneous octreotide  and transfusion as appropriate.  Patient require hospital admission for symptomatic anemia likely secondary to GI bleed.  Unable to get Protonix given her allergy.   Additional history obtained:  -External records from outside source obtained and reviewed including: Chart review including previous notes, labs, imaging, consultation notes   Lab Tests: -I ordered, reviewed, and interpreted labs.   The pertinent results include:   Labs Reviewed  CBC WITH DIFFERENTIAL/PLATELET - Abnormal; Notable for the following components:      Result Value   RBC 1.82 (*)    Hemoglobin 6.4 (*)    HCT 20.6 (*)    MCV 113.2 (*)    MCH 35.2 (*)    RDW 25.9 (*)    Lymphs Abs 0.3 (*)    Abs Immature Granulocytes 0.10 (*)     All other components within normal limits  COMPREHENSIVE METABOLIC PANEL - Abnormal; Notable for the following components:   Sodium 130 (*)    Chloride 93 (*)    Glucose, Bld 150 (*)    BUN 24 (*)    Creatinine, Ser 5.50 (*)    Calcium 8.2 (*)    Albumin 2.9 (*)    AST 8 (*)    GFR, Estimated 7 (*)    All other components within normal limits  IRON AND TIBC  FERRITIN  TYPE AND SCREEN  PREPARE RBC (CROSSMATCH)      EKG   EKG Interpretation Date/Time:  Friday January 27 2023 15:47:33 EDT Ventricular Rate:  50 PR Interval:  212 QRS Duration:  143 QT Interval:  570 QTC Calculation: 520 R Axis:   4  Text Interpretation: Sinus rhythm Borderline prolonged PR interval Left bundle branch block No significant change since last tracing Confirmed by Sharod Petsch ([id number]) on 01/27/2023 3:57:00 PM         Medicines ordered and prescription drug management: Meds ordered this encounter  Medications   0.9 %  sodium chloride infusion (Manually program via Guardrails IV Fluids)   octreotide (SANDOSTATIN) injection 100 mcg    -I have reviewed the patients home medicines and have made adjustments as needed  Critical interventions Packed red blood cell initiation  Consultations Obtained: I requested consultation with the nephrologist and gastroenterologist on-call,  and discussed lab and imaging findings as well as pertinent plan - they recommend: Hospital admission, routine dialysis, octreotide   Cardiac Monitoring: The patient was maintained on a cardiac monitor.  I personally viewed and interpreted the cardiac monitored which showed an underlying rhythm of: NSR  Social Determinants of Health:  Factors impacting patients care include: Has had difficulty getting outpatient subcu octreotide   Reevaluation: After the interventions noted above, I reevaluated the patient and found that they have :improved  Co morbidities that complicate the patient evaluation  Past Medical  History:  Diagnosis Date   Acute on chronic respiratory failure with hypoxia (HCC) 10/10/2016   Anxiety    Arthritis    AVM (arteriovenous malformation) of colon    CAD (coronary artery disease)    a. s/p CABG in 2013 b. DES to D1 in 10/2016. c. cath in 07/2018 showing patent grafts with occlusion of D1 at prior stent site and progression of PDA disease --> medical management recommended   Carotid artery disease (HCC)    a. 60-79% LICA, 03/2012    Chronic bronchitis (HCC)    Chronic HFrEF (heart failure with reduced ejection fraction) (HCC)    Colon cancer (HCC) 1992   Esophageal stricture  ESRD on hemodialysis (HCC)    ESRD due to HTN, started dialysis 2011 and gets HD at [facility] with Dr Fausto Skillern on MWF schedule.  Access is LUA AVF as of Sept 2014.    GERD (gastroesophageal reflux disease)    High cholesterol 12/2011   History of blood transfusion 07/2011; 12/2011; 01/2012 X 2; 04/2012   History of gout    History of lower GI bleeding    Hypertension    Iron deficiency anemia    Jugular vein occlusion, right (HCC)    Mitral regurgitation    a. Moderate by echo, 02/2012   Mitral valve disease    NSVT (nonsustained ventricular tachycardia) (HCC)    Ovarian cancer (HCC) 1992   PAF (paroxysmal atrial fibrillation) (HCC)    Pneumonia ~ 2009   PUD (peptic ulcer disease)    TIA (transient ischemic attack)    Tricuspid valve disease       Dispostion: I considered admission for this patient, and given symptomatic anemia patient require hospital admission     Final Clinical Impression(s) / ED Diagnoses Final diagnoses:  Symptomatic anemia     @PCDICTATION @    Glendora Score, MD 01/27/23 1738

## 2023-01-27 NOTE — H&P (Signed)
TRH H&P   Patient Demographics:    Shawna [name], is a 82 y.o. female  MRN: [id number]   DOB - [birth year]  Admit Date - 01/27/2023  Outpatient Primary MD for the patient is [facility]  Referring MD/NP/PA: Dr Posey Rea  Outpatient Specialists: GI Dr. Levon Hedger  Patient coming from: Hemodialysis center   Chief Complaint  Patient presents with   Abnormal Lab      HPI:    Shawna [name]  is a 82 y.o. female, with past medical history significant for hypertension, hyperlipidemia, CAD status post CABG and PCI, paroxysmal A-fib, no longer on anticoagulation due to recurrent GI bleed, ESRD on hemodialysis (MWF), seizure disorder, hypothyroidism, history of pulmonary.  -Patient was sent to ED by HD center for low hemoglobin, patient reports generalized weakness, fatigue, intermittent dark stools, patient with known history of AVM in the past, supposed to be on subcutaneous octreotide which has not been started yet, she had labs done at her HD center today given her generalized weakness and fatigue, for which hemoglobin was noted to be 6.1, so she was sent for further workup, she denies nausea, vomiting, abdominal pain, no fever or chills -in  ER her workup was significant for sodium of 130, hemoglobin of 6.4, platelet of 310, she was ordered 2 units PRBC, GI has been consulted, as well renal has been consulted as she has not had her HD today, [facility] hospitalist consulted to admit.   Review of systems:     A full 10 point Review of Systems was done, except as stated above, all other Review of Systems were negative.   With Past History of the following :    Past Medical History:  Diagnosis Date   Acute on chronic respiratory failure with hypoxia (HCC) 10/10/2016   Anxiety    Arthritis    AVM (arteriovenous malformation) of colon    CAD (coronary artery disease)    a. s/p CABG  in 2013 b. DES to D1 in 10/2016. c. cath in 07/2018 showing patent grafts with occlusion of D1 at prior stent site and progression of PDA disease --> medical management recommended   Carotid artery disease (HCC)    a. 60-79% LICA, 03/2012    Chronic bronchitis (HCC)    Chronic HFrEF (heart failure with reduced ejection fraction) (HCC)    Colon cancer (HCC) 1992   Esophageal stricture    ESRD on hemodialysis (HCC)    ESRD due to HTN, started dialysis 2011 and gets HD at [facility] with Dr Fausto Skillern on MWF schedule.  Access is LUA AVF as of Sept 2014.    GERD (gastroesophageal reflux disease)    High cholesterol 12/2011   History of blood transfusion 07/2011; 12/2011; 01/2012 X 2; 04/2012   History of gout    History of lower GI bleeding    Hypertension    Iron deficiency anemia  Jugular vein occlusion, right (HCC)    Mitral regurgitation    a. Moderate by echo, 02/2012   Mitral valve disease    NSVT (nonsustained ventricular tachycardia) (HCC)    Ovarian cancer (HCC) 1992   PAF (paroxysmal atrial fibrillation) (HCC)    Pneumonia ~ 2009   PUD (peptic ulcer disease)    TIA (transient ischemic attack)    Tricuspid valve disease       Past Surgical History:  Procedure Laterality Date   A/V FISTULAGRAM Left 10/04/2022   Procedure: A/V Fistulagram;  Surgeon: Renford Dills, MD;  Location: [facility] INVASIVE CV LAB;  Service: Cardiovascular;  Laterality: Left;   A/V SHUNTOGRAM Left 03/19/2019   Procedure: A/V SHUNTOGRAM;  Surgeon: Renford Dills, MD;  Location: [facility] INVASIVE CV LAB;  Service: Cardiovascular;  Laterality: Left;   ABDOMINAL HYSTERECTOMY  1992   APPENDECTOMY  06/1990   AV FISTULA PLACEMENT  07/2009   left upper arm   AV FISTULA PLACEMENT Right 09/06/2016   Procedure: RIGHT FOREARM ARTERIOVENOUS (AV) GRAFT;  Surgeon: Sherren Kerns, MD;  [facility]: [facility] OR;  Service: Vascular;  Laterality: Right;   AV FISTULA PLACEMENT N/A 02/24/2017   Procedure: INSERTION OF ARTERIOVENOUS  (AV) GORE-TEX GRAFT ARM (BRACHIAL AXILLARY);  Surgeon: Renford Dills, MD;  Location: [facility] ORS;  Service: Vascular;  Laterality: N/A;   AVGG REMOVAL Right 09/06/2016   Procedure: REMOVAL OF Right Arm ARTERIOVENOUS GORETEX GRAFT and Vein Patch angioplasty of brachial artery;  Surgeon: Chuck Hint, MD;  Location[facility] OR;  Service: Vascular;  Laterality: Right;   BIOPSY  09/26/2019   Procedure: BIOPSY;  Surgeon: Malissa Hippo, MD;  [facility];  Service: Endoscopy;;   BIOPSY  11/01/2022   Procedure: BIOPSY;  Surgeon: Dolores Frame, MD;  [facility];  Service: Gastroenterology;;   COLON RESECTION  1992   COLON SURGERY     COLONOSCOPY N/A 03/09/2019   Procedure: COLONOSCOPY;  Surgeon: Malissa Hippo, MD;  [facility];  Service: Endoscopy;  Laterality: N/A;   COLONOSCOPY N/A 08/11/2022   Procedure: COLONOSCOPY;  Surgeon: Beverley Fiedler, MD;  Location: [facility] ENDOSCOPY;  Service: Gastroenterology;  Laterality: N/A;   COLONOSCOPY WITH PROPOFOL N/A 06/08/2021   Procedure: COLONOSCOPY WITH PROPOFOL;  Surgeon: Dolores Frame, MD;  [facility];  Service: Gastroenterology;  Laterality: N/A;  9:05 /Patient is on dialysis Mon Wed Fri   CORONARY ANGIOPLASTY WITH STENT PLACEMENT  12/15/11   "2"   CORONARY ANGIOPLASTY WITH STENT PLACEMENT  y/2013   "1; makes total of 3" (05/02/2012)   CORONARY ARTERY BYPASS GRAFT  06/13/2012   Procedure: CORONARY ARTERY BYPASS GRAFTING (CABG);  Surgeon: Delight Ovens, MD;  Location[facility] OR;  Service: Open Heart Surgery;  Laterality: N/A;  cabg x four;  using left internal mammary artery, and left leg greater saphenous vein harvested endoscopically   CORONARY STENT INTERVENTION N/A 10/13/2016   Procedure: Coronary Stent Intervention;  Surgeon: Lennette Bihari, MD;  [facility];  Service: Cardiovascular;  Laterality: N/A;   DIALYSIS/PERMA CATHETER REMOVAL N/A 04/18/2017   Procedure:  DIALYSIS/PERMA CATHETER REMOVAL;  Surgeon: Renford Dills, MD;  Location: [facility] INVASIVE CV LAB;  Service: Cardiovascular;  Laterality: N/A;   DILATION AND CURETTAGE OF UTERUS     ENTEROSCOPY N/A 06/08/2021   Procedure: PUSH ENTEROSCOPY;  Surgeon: Dolores Frame, MD;  [facility];  Service: Gastroenterology;  Laterality: N/A;  ENTEROSCOPY N/A 11/01/2022   Procedure: ENTEROSCOPY;  Surgeon: Dolores Frame, MD;  [facility];  Service: Gastroenterology;  Laterality: N/A;   ENTEROSCOPY N/A 12/01/2022   Procedure: ENTEROSCOPY;  Surgeon: Benancio Deeds, MD;  Location: [facility] ENDOSCOPY;  Service: Gastroenterology;  Laterality: N/A;   ESOPHAGOGASTRODUODENOSCOPY  01/20/2012   Procedure: ESOPHAGOGASTRODUODENOSCOPY (EGD);  Surgeon: Meryl Dare, MD,FACG;  Location: [facility] ENDOSCOPY;  Service: Endoscopy;  Laterality: N/A;   ESOPHAGOGASTRODUODENOSCOPY N/A 03/26/2013   Procedure: ESOPHAGOGASTRODUODENOSCOPY (EGD);  Surgeon: Hilarie Fredrickson, MD;  Location: [facility] ENDOSCOPY;  Service: Endoscopy;  Laterality: N/A;   ESOPHAGOGASTRODUODENOSCOPY N/A 04/30/2015   Procedure: ESOPHAGOGASTRODUODENOSCOPY (EGD);  Surgeon: Malissa Hippo, MD;  [facility];  Service: Endoscopy;  Laterality: N/A;  1pm - moved to 10/20 @ 1:10   ESOPHAGOGASTRODUODENOSCOPY N/A 07/29/2016   Procedure: ESOPHAGOGASTRODUODENOSCOPY (EGD);  Surgeon: Ruffin Frederick, MD;  Location: [facility] ENDOSCOPY;  Service: Gastroenterology;  Laterality: N/A;  enteroscopy   ESOPHAGOGASTRODUODENOSCOPY N/A 09/26/2019   Procedure: ESOPHAGOGASTRODUODENOSCOPY (EGD);  Surgeon: Malissa Hippo, MD;  [facility];  Service: Endoscopy;  Laterality: N/A;  1250   ESOPHAGOGASTRODUODENOSCOPY N/A 08/11/2022   Procedure: ESOPHAGOGASTRODUODENOSCOPY (EGD);  Surgeon: Beverley Fiedler, MD;  Location: [facility] ENDOSCOPY;  Service: Gastroenterology;  Laterality: N/A;   ESOPHAGOGASTRODUODENOSCOPY (EGD) WITH PROPOFOL N/A 02/05/2021    Procedure: ESOPHAGOGASTRODUODENOSCOPY (EGD) WITH PROPOFOL;  Surgeon: Lanelle Bal, DO;  [facility];  Service: Endoscopy;  Laterality: N/A;   ESOPHAGOGASTRODUODENOSCOPY (EGD) WITH PROPOFOL N/A 08/27/2022   Procedure: ESOPHAGOGASTRODUODENOSCOPY (EGD) WITH PROPOFOL;  Surgeon: Lanelle Bal, DO;  [facility];  Service: Endoscopy;  Laterality: N/A;   GIVENS CAPSULE STUDY N/A 03/07/2019   Procedure: GIVENS CAPSULE STUDY;  Surgeon: Malissa Hippo, MD;  [facility];  Service: Endoscopy;  Laterality: N/A;  7:30   GIVENS CAPSULE STUDY N/A 04/22/2021   Procedure: GIVENS CAPSULE STUDY;  Surgeon: Malissa Hippo, MD;  [facility];  Service: Endoscopy;  Laterality: N/A;  7:30   GIVENS CAPSULE STUDY N/A 08/27/2022   Procedure: GIVENS CAPSULE STUDY;  Surgeon: Lanelle Bal, DO;  [facility];  Service: Endoscopy;  Laterality: N/A;   HOT HEMOSTASIS N/A 08/11/2022   Procedure: HOT HEMOSTASIS (ARGON PLASMA COAGULATION/BICAP);  Surgeon: Beverley Fiedler, MD;  Location: [facility] ENDOSCOPY;  Service: Gastroenterology;  Laterality: N/A;   HOT HEMOSTASIS N/A 12/01/2022   Procedure: HOT HEMOSTASIS (ARGON PLASMA COAGULATION/BICAP);  Surgeon: Benancio Deeds, MD;  Location: [facility] ENDOSCOPY;  Service: Gastroenterology;  Laterality: N/A;   INSERTION OF DIALYSIS CATHETER N/A 10/05/2020   Procedure: ABORTED TUNNELED DIALYSIS CATHETER PLACEMENT RIGHT INTERNAL JUGULAR VEIN ;  Surgeon: Lucretia Roers, MD;  Location: [facility]S;  Service: General;  Laterality: N/A;   INTRAOPERATIVE TRANSESOPHAGEAL ECHOCARDIOGRAM  06/13/2012   Procedure: INTRAOPERATIVE TRANSESOPHAGEAL ECHOCARDIOGRAM;  Surgeon: Delight Ovens, MD;  Location[facility];  Service: Open Heart Surgery;  Laterality: N/A;   IR DIALY SHUNT INTRO NEEDLE/INTRACATH INITIAL W/IMG LEFT Left 10/06/2020   IR FLUORO GUIDE CV LINE RIGHT  06/17/2020   IR FLUORO GUIDE CV LINE RIGHT  11/12/2022   IR GENERIC HISTORICAL  07/26/2016    IR FLUORO GUIDE CV LINE RIGHT 07/26/2016 Berdine Dance, MD [facility]   IR GENERIC HISTORICAL  07/26/2016   IR US GUIDE VASC ACCESS RIGHT 07/26/2016 Berdine Dance, MD [facility]   IR GENERIC HISTORICAL  08/02/2016   IR US GUIDE VASC ACCESS RIGHT 08/02/2016 Berdine Dance, MD  [facility]   IR GENERIC HISTORICAL  08/02/2016   IR FLUORO GUIDE CV LINE RIGHT 08/02/2016 Berdine Dance, MD [facility]   IR RADIOLOGY PERIPHERAL GUIDED IV START  03/28/2017   IR REMOVAL TUN CV CATH W/O FL  08/11/2020   IR REMOVAL TUN CV CATH W/O FL  11/15/2022   IR THROMBECTOMY AV FISTULA W/THROMBOLYSIS INC/SHUNT/IMG LEFT Left 06/17/2020   IR US GUIDE VASC ACCESS LEFT  06/17/2020   IR US GUIDE VASC ACCESS RIGHT  03/28/2017   IR US GUIDE VASC ACCESS RIGHT  06/17/2020   IR US GUIDE VASC ACCESS RIGHT  11/12/2022   LEFT HEART CATH AND CORONARY ANGIOGRAPHY N/A 09/20/2016   Procedure: Left Heart Cath and Coronary Angiography;  Surgeon: Lyn Records, MD;  Location: [facility] INVASIVE CV LAB;  Service: Cardiovascular;  Laterality: N/A;   LEFT HEART CATH AND CORS/GRAFTS ANGIOGRAPHY N/A 10/13/2016   Procedure: Left Heart Cath and Cors/Grafts Angiography;  Surgeon: Lennette Bihari, MD;  Location: [facility] CV LAB;  Service: Cardiovascular;  Laterality: N/A;   LEFT HEART CATH AND CORS/GRAFTS ANGIOGRAPHY N/A 07/13/2018   Procedure: LEFT HEART CATH AND CORS/GRAFTS ANGIOGRAPHY;  Surgeon: Swaziland, Peter M, MD;  Location: [facility] INVASIVE CV LAB;  Service: Cardiovascular;  Laterality: N/A;   LEFT HEART CATH AND CORS/GRAFTS ANGIOGRAPHY N/A 07/22/2021   Procedure: LEFT HEART CATH AND CORS/GRAFTS ANGIOGRAPHY;  Surgeon: Runell Gess, MD;  Location: [facility] INVAS[facility] CV LAB;  Service: Cardiovascular;  Laterality: N/A;   LEFT HEART CATHETERIZATION WITH CORONARY ANGIOGRAM N/A 12/15/2011   Procedure: LEFT HEART CATHETERIZATION WITH CORONARY ANGIOGRAM;  Surgeon: Kathleene Hazel, MD;  Location: [facility] CATH LAB;  Service: Cardiovascular;  Laterality: N/A;   LEFT HEART  CATHETERIZATION WITH CORONARY ANGIOGRAM N/A 01/10/2012   Procedure: LEFT HEART CATHETERIZATION WITH CORONARY ANGIOGRAM;  Surgeon: Peter M Swaziland, MD;  Location: [facility] CATH LAB;  Service: Cardiovascular;  Laterality: N/A;   LEFT HEART CATHETERIZATION WITH CORONARY ANGIOGRAM N/A 06/08/2012   Procedure: LEFT HEART CATHETERIZATION WITH CORONARY ANGIOGRAM;  Surgeon: Kathleene Hazel, MD;  Location: [facility] CATH LAB;  Service: Cardiovascular;  Laterality: N/A;   LEFT HEART CATHETERIZATION WITH CORONARY/GRAFT ANGIOGRAM N/A 12/10/2013   Procedure: LEFT HEART CATHETERIZATION WITH Isabel Caprice;  Surgeon: Corky Crafts, MD;  Location: [facility] CATH LAB;  Service: Cardiovascular;  Laterality: N/A;   OVARY SURGERY     ovarian cancer   POLYPECTOMY  03/09/2019   Procedure: POLYPECTOMY;  Surgeon: Malissa Hippo, MD;  [facility];  Service: Endoscopy;;  cecal    POLYPECTOMY N/A 09/26/2019   Procedure: DUODENAL POLYPECTOMY;  Surgeon: Malissa Hippo, MD;  [facility];  Service: Endoscopy;  Laterality: N/A;   POLYPECTOMY  08/11/2022   Procedure: POLYPECTOMY;  Surgeon: Beverley Fiedler, MD;  Location: [facility] ENDOSCOPY;  Service: Gastroenterology;;   REVISION OF ARTERIOVENOUS GORETEX GRAFT N/A 02/24/2017   Procedure: REVISION OF ARTERIOVENOUS GORETEX GRAFT (RESECTION);  Surgeon: Renford Dills, MD;  Location: [facility] ORS;  Service: Vascular;  Laterality: N/A;   REVISON OF ARTERIOVENOUS FISTULA Left 06/19/2020   Procedure: REVISION OF LEFT UPPER ARM AV GRAFT WITH INTERPOSITION JUMP GRAFT USING GORE LIMB;  Surgeon: Cephus Shelling, MD;  Location: [facility] OR;  Service: Vascular;  Laterality: Left;   SHUNTOGRAM N/A 10/15/2013   Procedure: Fistulogram;  Surgeon: Nada Libman, MD;  Location: [facility] CATH LAB;  Service: Cardiovascular;  Laterality: N/A;   SUBMUCOSAL TATTOO INJECTION  11/01/2022   Procedure: SUBMUCOSAL TATTOO INJECTION;  Surgeon: Marguerita Merles, Reuel Boom, MD;  [facility];   Service: Gastroenterology;;   THROMBECTOMY / ARTERIOVENOUS GRAFT REVISION  2011   left upper arm   TUBAL LIGATION  1980's   UPPER EXTREMITY ANGIOGRAPHY Bilateral 12/06/2016   Procedure: Upper Extremity Angiography;  Surgeon: Renford Dills, MD;  Location: [facility] INVASIVE CV LAB;  Service: Cardiovascular;  Laterality: Bilateral;   UPPER EXTREMITY INTERVENTION Left 06/06/2017   Procedure: UPPER EXTREMITY INTERVENTION;  Surgeon: Renford Dills, MD;  Location: [facility] INVASIVE CV LAB;  Service: Cardiovascular;  Laterality: Left;   UPPER EXTREMITY VENOGRAPHY Left 10/04/2022   Procedure: UPPER EXTREMITY VENOGRAPHY;  Surgeon: Renford Dills, MD;  Location: [facility] INVASIVE CV LAB;  Service: Cardiovascular;  Laterality: Left;      Social History:     Social History   Tobacco Use   Smoking status: Never   Smokeless tobacco: Never  Substance Use Topics   Alcohol use: No    Alcohol/week: 0.0 standard drinks of alcohol        Family History :     Family History  Problem Relation Age of Onset   Heart disease Mother        Heart Disease before age 94   Hyperlipidemia Mother    Hypertension Mother    Diabetes Mother    Heart attack Mother    Heart disease Father        Heart Disease before age 70   Hyperlipidemia Father    Hypertension Father    Diabetes Father    Diabetes Sister    Hypertension Sister    Diabetes Brother    Hyperlipidemia Brother    Heart attack Brother    Hypertension Sister    Heart attack Brother    Colon cancer Child 47   Other Other        noncontributory for early CAD   Esophageal cancer Neg Hx    Liver disease Neg Hx    Kidney disease Neg Hx    Colon polyps Neg Hx      Home Medications:   Prior to Admission medications   Medication Sig Start Date End Date Taking? Authorizing Provider  ALPRAZolam Prudy Feeler) 0.5 MG tablet Take 1 tablet (0.5 mg total) by mouth daily as needed for up to 4 doses for anxiety. 12/19/22   Lanae Boast, MD  amiodarone  (PACERONE) 200 MG tablet Take 200 mg by mouth daily. 11/21/22   [provider]  benzonatate (TESSALON) 100 MG capsule Take 1 capsule (100 mg total) by mouth 3 (three) times daily as needed for cough. 01/18/23   Tilden Fossa, MD  calcitRIOL (ROCALTROL) 0.25 MCG capsule Take 7 capsules (1.75 mcg total) by mouth every Monday, Wednesday, and Friday with hemodialysis. 12/21/22   Lanae Boast, MD  cinacalcet (SENSIPAR) 30 MG tablet Take 1 tablet (30 mg total) by mouth every Monday, Wednesday, and Friday with hemodialysis. 12/19/22   Lanae Boast, MD  cyclobenzaprine (FLEXERIL) 5 MG tablet Take 5 mg by mouth 3 (three) times daily as needed.    [provider]  folic acid (FOLVITE) 1 MG tablet Take 1 mg by mouth daily. 05/24/22   [provider]  gabapentin (NEURONTIN) 100 MG capsule Take 1 capsule (100 mg total) by mouth every evening. 12/13/22   Jerald Kief, MD  guaiFENesin-codeine 100-10 MG/5ML syrup Take 5 mLs by mouth every 4 (four) hours as needed for cough. 10/23/22   Jacalyn Lefevre, MD  hydrALAZINE (APRESOLINE) 25 MG tablet Take  1 tablet (25 mg total) by mouth 2 (two) times daily. Hold for sbp <110 12/27/22   Lanae Boast, MD  hydrOXYzine (ATARAX) 25 MG tablet Take 25 mg by mouth every 6 (six) hours as needed for itching. 06/06/22   [provider]  levETIRAcetam (KEPPRA) 500 MG tablet Take 1 tablet (500 mg total) by mouth daily. 11/15/22   Leroy Sea, MD  levothyroxine (SYNTHROID) 75 MCG tablet Take 1 tablet (75 mcg total) by mouth daily before breakfast. 08/14/22 12/02/22  Hughie Closs, MD  metoprolol tartrate (LOPRESSOR) 25 MG tablet Take 1 tablet (25 mg total) by mouth 2 (two) times daily. Hold for sbp <110 and hr < 60/min 12/27/22   Lanae Boast, MD  multivitamin (RENA-VIT) TABS tablet Take 1 tablet by mouth daily.    [provider]  nitroGLYCERIN (NITROSTAT) 0.4 MG SL tablet Place 0.4 mg under the tongue every 5 (five) minutes as needed for chest pain.     [provider]  octreotide (SANDOSTATIN LAR) 20 MG injection Inject 20 mg into the muscle every 28 (twenty-eight) days. 11/15/22   Leroy Sea, MD  omeprazole (PRILOSEC) 40 MG capsule Take 1 capsule (40 mg total) by mouth daily. 10/06/22   Aida Raider, NP  ondansetron (ZOFRAN-ODT) 4 MG disintegrating tablet Take 4 mg by mouth every 8 (eight) hours as needed for nausea or vomiting. 05/05/22   [provider]  rosuvastatin (CRESTOR) 10 MG tablet Take 1 tablet by mouth once daily 06/06/22   Iran Ouch, Lennart Pall, PA-C     Allergies:     Allergies  Allergen Reactions   Amlodipine Swelling   Aspirin Other (See Comments)    High Doses Mess up her stomach; "makes my bowels have blood in them". Takes 81 mg EC Aspirin    Nitrofurantoin Hives   Ranexa [Ranolazine] Other (See Comments)    Myoclonus-hospitalized    Bactrim [Sulfamethoxazole-Trimethoprim] Rash   Contrast Media [Iodinated Contrast Media] Itching   Iron Itching and Other (See Comments)    "they gave me iron in dialysis; had to give me Benadryl cause I had to have the iron" (05/02/2012)   Gabapentin Other (See Comments)    Unknown reaction   Iron Sucrose Other (See Comments)    Unknown   Sucroferric Oxyhydroxide Other (See Comments)    Unknown   Levaquin [Levofloxacin In D5w] Rash   Pantoprazole Rash   Plavix [Clopidogrel Bisulfate] Rash   Protonix [Pantoprazole Sodium] Rash   Venofer [Ferric Oxide] Itching and Other (See Comments)    Patient reports using Benadryl prior to doses as [facility]     Physical Exam:   Vitals  Blood pressure (!) 151/62, pulse (!) 51, temperature 98.2 F (36.8 C), temperature source Oral, resp. rate 14, height 5\' 2"  (1.575 m), weight 51.7 kg, SpO2 100%.   1. General elderly female, laying in bed, no apparent distress  2. Normal affect and insight, Not Suicidal or Homicidal, Awake Alert, Oriented X 3.  3. No F.N deficits, ALL C.Nerves Intact, Strength 5/5 all 4  extremities, Sensation intact all 4 extremities, Plantars down going.  4. Ears and Eyes appear Normal, Conjunctivae clear, PERRLA. Moist Oral Mucosa.  5. Supple Neck, No JVD, No cervical lymphadenopathy appriciated, No Carotid Bruits.  6. Symmetrical Chest wall movement, Good air movement bilaterally, CTAB.  7. RRR, No Gallops, Rubs or Murmurs, No Parasternal Heave.  8. Positive Bowel Sounds, Abdomen Soft, No tenderness, No organomegaly appriciated,No rebound -guarding or rigidity.  9.  No Cyanosis, Normal Skin Turgor, No Skin Rash or Bruise.  10. Good muscle tone,  joints appear normal , no effusions, Normal ROM.     Data Review:    CBC Recent Labs  Lab 01/27/23 1640  WBC 6.6  HGB 6.4*  HCT 20.6*  PLT 310  MCV 113.2*  MCH 35.2*  MCHC 31.1  RDW 25.9*  LYMPHSABS 0.3*  MONOABS 0.3  EOSABS 0.0  BASOSABS 0.1   ------------------------------------------------------------------------------------------------------------------  Chemistries  Recent Labs  Lab 01/27/23 1640  NA 130*  K 3.7  CL 93*  CO2 25  GLUCOSE 150*  BUN 24*  CREATININE 5.50*  CALCIUM 8.2*  AST 8*  ALT 10  ALKPHOS 83  BILITOT 1.0   ------------------------------------------------------------------------------------------------------------------ estimated creatinine clearance is 6.2 mL/min (A) (by C-G formula based on SCr of 5.5 mg/dL (H)). ------------------------------------------------------------------------------------------------------------------ No results for input(s): "TSH", "T4TOTAL", "T3FREE", "THYROIDAB" in the last 72 hours.  Invalid input(s): "FREET3"  Coagulation profile No results for input(s): "INR", "PROTIME" in the last 168 hours. ------------------------------------------------------------------------------------------------------------------- No results for input(s): "DDIMER" in the last 72  hours. -------------------------------------------------------------------------------------------------------------------  Cardiac Enzymes No results for input(s): "CKMB", "TROPONINI", "MYOGLOBIN" in the last 168 hours.  Invalid input(s): "CK" ------------------------------------------------------------------------------------------------------------------    Component Value Date/Time   BNP 2,205.0 (H) 10/23/2022 1420     ---------------------------------------------------------------------------------------------------------------  Urinalysis    Component Value Date/Time   COLORURINE YELLOW 12/16/2012 1919   APPEARANCEUR CLOUDY (A) 12/16/2012 1919   LABSPEC 1.009 12/16/2012 1919   PHURINE 7.5 12/16/2012 1919   GLUCOSEU NEGATIVE 12/16/2012 1919   HGBUR TRACE (A) 12/16/2012 1919   BILIRUBINUR NEGATIVE 12/16/2012 1919   KETONESUR NEGATIVE 12/16/2012 1919   PROTEINUR 100 (A) 12/16/2012 1919   UROBILINOGEN 0.2 12/16/2012 1919   NITRITE NEGATIVE 12/16/2012 1919   LEUKOCYTESUR SMALL (A) 12/16/2012 1919    ----------------------------------------------------------------------------------------------------------------   Imaging Results:    No results found.     Assessment & Plan:    Principal Problem:   Acute blood loss anemia Active Problems:   AVM (arteriovenous malformation) of small bowel, acquired with hemorrhage   Acquired hypothyroidism   Chronic combined systolic and diastolic CHF (congestive heart failure) (HCC)   Atrial fibrillation, chronic (HCC)   Benign neoplasm of descending colon   Angiodysplasia of duodenum    Acute on chronic blood loss anemia Upper GI bleed secondary to known AVM -Presented with somatic anemia, melena, this is most likely in the setting of known AVM of small bowel -Started on octreotide per GI recommendation(it has been on as outpatient as well, unclear why patient has not been receiving it. -She had small bowel in uroscopy with  duodenal diverticulum, with 6 nonbleeding angioplastic lesions in the proximal jejunum which has been treated with argon plasma coagulation in the past, GI has been consulted, will defer further workup and need for repeat enteroscopy to GI -continue with supportive transfusion, she will receive 2 units PRBC-will keep on clear liquid diet -SCD for DVT prophylaxis  ESRD -She did not receive HD today, IR consulted, plan for HD in a.m.  Atrial tachycardia/paroxysmal atrial fibrillation/flutter/?WCT: -Has been seen by cardiology in the past, continue rhythm control medication with amiodarone and metoprolol -Not on anticoagulation secondary to recurrent GI bleed  Chronic systolic CHF with EF 30-35% CAD status post CABG with residual small vessel disease Chest pain due to demand ischemia: -Volume management with HD -No anticoagulation or antiplatelet therapy secondary to recurrent GI bleed, she denies any chest pain  -Attention  -  blood pressure is acceptable, continue with metoprolol and hydralazine  Hyperlipidemia: Continue statin   Type diabetes mellitus  -Chol with diet, monitor CBG   Acquired hypothyroidism  - on levothyroxine.    DVT Prophylaxis  SCDs  AM Labs Ordered, also please review Full Orders  Family Communication: Admission, patients condition and plan of care including tests being ordered have been discussed with the patient  who indicate understanding and agree with the plan and Code Status.  Code Status Full  Likely DC to  home  Condition GUARDED    Consults called: GI    Admission status: inpatient    Time spent in minutes : 70 minutes   Huey Bienenstock M.D on 01/27/2023 at 6:01 PM   [facility] - Office  [phone]

## 2023-01-27 NOTE — ED Notes (Signed)
AC made aware pt needs sandostatin injection from main pharmacy.

## 2023-01-27 NOTE — ED Triage Notes (Signed)
Pt via POV sent per phone call from dialysis clinic due to HgB 6.1. Pt HD schedule M/W/F. Pt states she feels a little dizzy but denies SOB.

## 2023-01-28 DIAGNOSIS — D62 Acute posthemorrhagic anemia: Secondary | ICD-10-CM | POA: Diagnosis not present

## 2023-01-28 DIAGNOSIS — E039 Hypothyroidism, unspecified: Secondary | ICD-10-CM | POA: Diagnosis not present

## 2023-01-28 DIAGNOSIS — I5042 Chronic combined systolic (congestive) and diastolic (congestive) heart failure: Secondary | ICD-10-CM | POA: Diagnosis not present

## 2023-01-28 DIAGNOSIS — K5521 Angiodysplasia of colon with hemorrhage: Secondary | ICD-10-CM | POA: Diagnosis not present

## 2023-01-28 LAB — BASIC METABOLIC PANEL
Anion gap: 13 (ref 5–15)
BUN: 29 mg/dL — ABNORMAL HIGH (ref 8–23)
CO2: 25 mmol/L (ref 22–32)
Calcium: 8.3 mg/dL — ABNORMAL LOW (ref 8.9–10.3)
Chloride: 92 mmol/L — ABNORMAL LOW (ref 98–111)
Creatinine, Ser: 6.26 mg/dL — ABNORMAL HIGH (ref 0.44–1.00)
GFR, Estimated: 6 mL/min — ABNORMAL LOW (ref >60)
Glucose, Bld: 151 mg/dL — ABNORMAL HIGH (ref 70–99)
Potassium: 3.9 mmol/L (ref 3.5–5.1)
Sodium: 130 mmol/L — ABNORMAL LOW (ref 135–145)

## 2023-01-28 LAB — TYPE AND SCREEN
Unit division: 0
Unit division: 0

## 2023-01-28 LAB — BPAM RBC
Unit Type and Rh: 5100
Unit Type and Rh: 5100

## 2023-01-28 LAB — CBC
HCT: 38.1 % (ref 36.0–46.0)
Hemoglobin: 12.5 g/dL (ref 12.0–15.0)
MCH: 32.8 pg (ref 26.0–34.0)
MCHC: 32.8 g/dL (ref 30.0–36.0)
MCV: 100 fL (ref 80.0–100.0)
Platelets: 252 10*3/uL (ref 150–400)
RBC: 3.81 MIL/uL — ABNORMAL LOW (ref 3.87–5.11)
RDW: 24.8 % — ABNORMAL HIGH (ref 11.5–15.5)
WBC: 7.2 10*3/uL (ref 4.0–10.5)
nRBC: 0 % (ref 0.0–0.2)

## 2023-01-28 LAB — HEPATITIS B SURFACE ANTIGEN: Hepatitis B Surface Ag: NONREACTIVE

## 2023-01-28 LAB — MRSA NEXT GEN BY PCR, NASAL: MRSA by PCR Next Gen: NOT DETECTED

## 2023-01-28 MED ORDER — DARBEPOETIN ALFA 100 MCG/0.5ML IJ SOSY
100.0000 ug | PREFILLED_SYRINGE | Freq: Once | INTRAMUSCULAR | Status: AC
  Administered 2023-01-28: 100 ug via SUBCUTANEOUS
  Filled 2023-01-28: qty 0.5

## 2023-01-28 MED ORDER — FAMOTIDINE 20 MG PO TABS
20.0000 mg | ORAL_TABLET | Freq: Every day | ORAL | Status: AC
  Administered 2023-01-28 – 2023-01-29 (×2): 20 mg via ORAL
  Filled 2023-01-28 (×2): qty 1

## 2023-01-28 MED ORDER — FAMOTIDINE 20 MG PO TABS: 10.0000 mg | ORAL_TABLET | Freq: Every day | ORAL | Status: DC

## 2023-01-28 MED ORDER — LEVETIRACETAM 500 MG PO TABS
ORAL_TABLET | ORAL | Status: AC
  Filled 2023-01-28: qty 1

## 2023-01-28 MED ORDER — ACETAMINOPHEN 325 MG PO TABS
650.0000 mg | ORAL_TABLET | Freq: Four times a day (QID) | ORAL | Status: DC | PRN
  Administered 2023-01-28: 650 mg via ORAL
  Filled 2023-01-28: qty 2

## 2023-01-28 MED ORDER — IRBESARTAN 150 MG PO TABS
150.0000 mg | ORAL_TABLET | Freq: Every day | ORAL | Status: DC
  Administered 2023-01-29 – 2023-02-01 (×4): 150 mg via ORAL
  Filled 2023-01-28 (×4): qty 1

## 2023-01-28 MED ORDER — OMEPRAZOLE 20 MG PO CPDR
20.0000 mg | DELAYED_RELEASE_CAPSULE | Freq: Two times a day (BID) | ORAL | Status: DC
  Administered 2023-01-30 – 2023-02-01 (×5): 20 mg via ORAL
  Filled 2023-01-28 (×9): qty 1

## 2023-01-28 MED ORDER — HYDRALAZINE HCL 20 MG/ML IJ SOLN
5.0000 mg | Freq: Once | INTRAMUSCULAR | Status: AC
  Administered 2023-01-28: 5 mg via INTRAVENOUS
  Filled 2023-01-28: qty 1

## 2023-01-28 MED ORDER — ORAL CARE MOUTH RINSE: 15.0000 mL | OROMUCOSAL | Status: DC | PRN

## 2023-01-28 MED ORDER — CYCLOBENZAPRINE HCL 10 MG PO TABS: 5.0000 mg | ORAL_TABLET | Freq: Three times a day (TID) | ORAL | Status: DC | PRN

## 2023-01-28 NOTE — Consult Note (Signed)
[name], M.D. Gastroenterology & Hepatology                                           Patient Name: Shawna [name] Account #: @FLAACCTNO @   MRN: [id number] Admission Date: 01/27/2023 Date of Evaluation:  01/28/2023 Time of Evaluation: 12:12 PM   Referring Physician: Standley Dakins, MD  Chief Complaint: Recurrent severe anemia  HPI:  This is a 82 y.o. female history of dementia, ESRD on hemodialysis, A-fib on aspirin, CAD, CHF, recurrent gastric and small bowel AVMs, GERD, HTN, colon cancer s/p resection in 1992, duodenal adenoma, who was admitted to the hospital after being sent from the dialysis center due to severe anemia.  Patient is a poor historian.  Does not remember exactly why she was brought to the hospital.  She denies having any melena or hematochezia.  No reports of these per clinical notes.  Based on medical chart, she was found to have low hemoglobin in her dialysis center (hemoglobin of 6.1) and was sent to [facility] for further evaluation.  Notably, the patient was supposed to start the octreotide intramuscular injections 20 mg every 4 weeks.  However, it was difficult to arrange who would administer this medication as outpatient.  She only received 1 dose on her hospitalization in May 7th 2024.  She was supposed to receive the other doses since then.  Fortunately, her PCP recently agreed to administer this medication and the patient is supposed to obtain free access to this medicine.  Notably, the patient was admitted to [facility] on 12/02/2022 due to recurrent anemia.  Underwent a small bowel enteroscopy that showed a  2 cm hiatal hernia, few nonbleeding angiodysplastic lesions in stomach (treated with APC), normal stomach, normal duodenum. Tattoo seen in jejunum with small polyp. Jejunal diverticulum (several). 6 nonbleeding angiodysplastic lesions in the proximal jejunum beyond the tattoo (treated with APC)   Last colonoscopy 08/2022 - - Preparation of the  colon was fair. - Patent end- to- end ileo- colonic and end to side colocolonic anastomoses, characterized by healthy appearing mucosa. - The examined portion of the neo- terminal ileum was normal. - Two 5 to 6 mm polyps in the transverse colon, removed with a cold snare. Resected and retrieved. - Three 4 to 7 mm polyps in the descending colon, removed with a cold snare. Resected and retrieved. - One 9 mm polyp in the sigmoid colon, removed with a cold snare. Resected and retrieved. - Moderate diverticulosis in the sigmoid colon and in the descending colon.  In the ED, he was HD stable and afebrile. Labs were remarkable for hemoglobin 6.4, WBC 6.6, platelets 310, iron 135, iron saturation 89%, ferritin 1469, CMP with sodium 130, potassium 3.7, creatinine 5.5, BUN 24, normal LFTs.  Patient was transfused 1 unit PRBC.   Past Medical History: SEE CHRONIC ISSSUES: Past Medical History:  Diagnosis Date   Acute on chronic respiratory failure with hypoxia (HCC) 10/10/2016   Anxiety    Arthritis    AVM (arteriovenous malformation) of colon    CAD (coronary artery disease)    a. s/p CABG in 2013 b. DES to D1 in 10/2016. c. cath in 07/2018 showing patent grafts with occlusion of D1 at prior stent site and progression of PDA disease --> medical management recommended   Carotid artery disease (HCC)    a. 60-79% LICA, 03/2012  Chronic bronchitis (HCC)    Chronic HFrEF (heart failure with reduced ejection fraction) (HCC)    Colon cancer (HCC) 1992   Esophageal stricture    ESRD on hemodialysis (HCC)    ESRD due to HTN, started dialysis 2011 and gets HD at [facility] with Dr Fausto Skillern on MWF schedule.  Access is LUA AVF as of Sept 2014.    GERD (gastroesophageal reflux disease)    High cholesterol 12/2011   History of blood transfusion 07/2011; 12/2011; 01/2012 X 2; 04/2012   History of gout    History of lower GI bleeding    Hypertension    Iron deficiency anemia    Jugular vein occlusion, right (HCC)     Mitral regurgitation    a. Moderate by echo, 02/2012   Mitral valve disease    NSVT (nonsustained ventricular tachycardia) (HCC)    Ovarian cancer (HCC) 1992   PAF (paroxysmal atrial fibrillation) (HCC)    Pneumonia ~ 2009   PUD (peptic ulcer disease)    TIA (transient ischemic attack)    Tricuspid valve disease    Past Surgical History:  Past Surgical History:  Procedure Laterality Date   A/V FISTULAGRAM Left 10/04/2022   Procedure: A/V Fistulagram;  Surgeon: Renford Dills, MD;  Location: [facility] INVASIVE CV LAB;  Service: Cardiovascular;  Laterality: Left;   A/V SHUNTOGRAM Left 03/19/2019   Procedure: A/V SHUNTOGRAM;  Surgeon: Renford Dills, MD;  Location: [facility] INVASIVE CV LAB;  Service: Cardiovascular;  Laterality: Left;   ABDOMINAL HYSTERECTOMY  1992   APPENDECTOMY  06/1990   AV FISTULA PLACEMENT  07/2009   left upper arm   AV FISTULA PLACEMENT Right 09/06/2016   Procedure: RIGHT FOREARM ARTERIOVENOUS (AV) GRAFT;  Surgeon: Sherren Kerns, MD;  Location: [facility] OR;  Service: Vascular;  Laterality: Right;   AV FISTULA PLACEMENT N/A 02/24/2017   Procedure: INSERTION OF ARTERIOVENOUS (AV) GORE-TEX GRAFT ARM (BRACHIAL AXILLARY);  Surgeon: Renford Dills, MD;  Location: [facility] ORS;  Service: Vascular;  Laterality: N/A;   AVGG REMOVAL Right 09/06/2016   Procedure: REMOVAL OF Right Arm ARTERIOVENOUS GORETEX GRAFT and Vein Patch angioplasty of brachial artery;  Surgeon: Chuck Hint, MD;  Location: [facility] OR;  Service: Vascular;  Laterality: Right;   BIOPSY  09/26/2019   Procedure: BIOPSY;  Surgeon: Malissa Hippo, MD;  [facility];  Service: Endoscopy;;   BIOPSY  11/01/2022   Procedure: BIOPSY;  Surgeon: Dolores Frame, MD;  [facility];  Service: Gastroenterology;;   COLON RESECTION  1992   COLON SURGERY     COLONOSCOPY N/A 03/09/2019   Procedure: COLONOSCOPY;  Surgeon: Malissa Hippo, MD;  [facility];  Service: Endoscopy;   Laterality: N/A;   COLONOSCOPY N/A 08/11/2022   Procedure: COLONOSCOPY;  Surgeon: Beverley Fiedler, MD;  Location: [facility] ENDOSCOPY;  Service: Gastroenterology;  Laterality: N/A;   COLONOSCOPY WITH PROPOFOL N/A 06/08/2021   Procedure: COLONOSCOPY WITH PROPOFOL;  Surgeon: Dolores Frame, MD;  [facility];  Service: Gastroenterology;  Laterality: N/A;  9:05 /Patient is on dialysis Mon Wed Fri   CORONARY ANGIOPLASTY WITH STENT PLACEMENT  12/15/11   "2"   CORONARY ANGIOPLASTY WITH STENT PLACEMENT  y/2013   "1; makes total of 3" (05/02/2012)   CORONARY ARTERY BYPASS GRAFT  06/13/2012   Procedure: CORONARY ARTERY BYPASS GRAFTING (CABG);  Surgeon: Delight Ovens, MD;  Location: [facility] OR;  Service: Open Heart Surgery;  Laterality: N/A;  cabg  x four;  using left internal mammary artery, and left leg greater saphenous vein harvested endoscopically   CORONARY STENT INTERVENTION N/A 10/13/2016   Procedure: Coronary Stent Intervention;  Surgeon: Lennette Bihari, MD;  [facility] INV[facility] CV LAB;  Service: Cardiovascular;  Laterality: N/A;   DIALYSIS/PERMA CATHETER REMOVAL N/A 04/18/2017   Procedure: DIALYSIS/PERMA CATHETER REMOVAL;  Surgeon: Renford Dills, MD;  Location: [facility] INVASIVE CV LAB;  Service: Cardiovascular;  Laterality: N/A;   DILATION AND CURETTAGE OF UTERUS     ENTEROSCOPY N/A 06/08/2021   Procedure: PUSH ENTEROSCOPY;  Surgeon: Dolores Frame, MD;  [facility];  Service: Gastroenterology;  Laterality: N/A;   ENTEROSCOPY N/A 11/01/2022   Procedure: ENTEROSCOPY;  Surgeon: Dolores Frame, MD;  [facility];  Service: Gastroenterology;  Laterality: N/A;   ENTEROSCOPY N/A 12/01/2022   Procedure: ENTEROSCOPY;  Surgeon: Benancio Deeds, MD;  Location: [facility] ENDOSCOPY;  Service: Gastroenterology;  Laterality: N/A;   ESOPHAGOGASTRODUODENOSCOPY  01/20/2012   Procedure: ESOPHAGOGASTRODUODENOSCOPY (EGD);  Surgeon: Meryl Dare, MD,FACG;   Location: [facility] ENDOSCOPY;  Service: Endoscopy;  Laterality: N/A;   ESOPHAGOGASTRODUODENOSCOPY N/A 03/26/2013   Procedure: ESOPHAGOGASTRODUODENOSCOPY (EGD);  Surgeon: Hilarie Fredrickson, MD;  Location: [facility] ENDOSCOPY;  Service: Endoscopy;  Laterality: N/A;   ESOPHAGOGASTRODUODENOSCOPY N/A 04/30/2015   Procedure: ESOPHAGOGASTRODUODENOSCOPY (EGD);  Surgeon: Malissa Hippo, MD;  [facility];  Service: Endoscopy;  Laterality: N/A;  1pm - moved to 10/20 @ 1:10   ESOPHAGOGASTRODUODENOSCOPY N/A 07/29/2016   Procedure: ESOPHAGOGASTRODUODENOSCOPY (EGD);  Surgeon: Ruffin Frederick, MD;  Location: [facility] ENDOSCOPY;  Service: Gastroenterology;  Laterality: N/A;  enteroscopy   ESOPHAGOGASTRODUODENOSCOPY N/A 09/26/2019   Procedure: ESOPHAGOGASTRODUODENOSCOPY (EGD);  Surgeon: Malissa Hippo, MD;  [facility];  Service: Endoscopy;  Laterality: N/A;  1250   ESOPHAGOGASTRODUODENOSCOPY N/A 08/11/2022   Procedure: ESOPHAGOGASTRODUODENOSCOPY (EGD);  Surgeon: Beverley Fiedler, MD;  Location: [facility] ENDOSCOPY;  Service: Gastroenterology;  Laterality: N/A;   ESOPHAGOGASTRODUODENOSCOPY (EGD) WITH PROPOFOL N/A 02/05/2021   Procedure: ESOPHAGOGASTRODUODENOSCOPY (EGD) WITH PROPOFOL;  Surgeon: Lanelle Bal, DO;  [facility];  Service: Endoscopy;  Laterality: N/A;   ESOPHAGOGASTRODUODENOSCOPY (EGD) WITH PROPOFOL N/A 08/27/2022   Procedure: ESOPHAGOGASTRODUODENOSCOPY (EGD) WITH PROPOFOL;  Surgeon: Lanelle Bal, DO;  [facility];  Service: Endoscopy;  Laterality: N/A;   GIVENS CAPSULE STUDY N/A 03/07/2019   Procedure: GIVENS CAPSULE STUDY;  Surgeon: Malissa Hippo, MD;  [facility];  Service: Endoscopy;  Laterality: N/A;  7:30   GIVENS CAPSULE STUDY N/A 04/22/2021   Procedure: GIVENS CAPSULE STUDY;  Surgeon: Malissa Hippo, MD;  [facility];  Service: Endoscopy;  Laterality: N/A;  7:30   GIVENS CAPSULE STUDY N/A 08/27/2022   Procedure: GIVENS CAPSULE STUDY;   Surgeon: Lanelle Bal, DO;  [facility];  Service: Endoscopy;  Laterality: N/A;   HOT HEMOSTASIS N/A 08/11/2022   Procedure: HOT HEMOSTASIS (ARGON PLASMA COAGULATION/BICAP);  Surgeon: Beverley Fiedler, MD;  Location: [facility] ENDOSCOPY;  Service: Gastroenterology;  Laterality: N/A;   HOT HEMOSTASIS N/A 12/01/2022   Procedure: HOT HEMOSTASIS (ARGON PLASMA COAGULATION/BICAP);  Surgeon: Benancio Deeds, MD;  Location: [facility] ENDOSCOPY;  Service: Gastroenterology;  Laterality: N/A;   INSERTION OF DIALYSIS CATHETER N/A 10/05/2020   Procedure: ABORTED TUNNELED DIALYSIS CATHETER PLACEMENT RIGHT INTERNAL JUGULAR VEIN ;  Surgeon: Lucretia Roers, MD;  Location: [facility]S;  Service: General;  Laterality: N/A;   INTRAOPERATIVE TRANSESOPHAGEAL ECHOCARDIOGRAM  06/13/2012  Procedure: INTRAOPERATIVE TRANSESOPHAGEAL ECHOCARDIOGRAM;  Surgeon: Delight Ovens, MD;  Location: [facility] OR;  Service: Open Heart Surgery;  Laterality: N/A;   IR DIALY SHUNT INTRO NEEDLE/INTRACATH INITIAL W/IMG LEFT Left 10/06/2020   IR FLUORO GUIDE CV LINE RIGHT  06/17/2020   IR FLUORO GUIDE CV LINE RIGHT  11/12/2022   IR GENERIC HISTORICAL  07/26/2016   IR FLUORO GUIDE CV LINE RIGHT 07/26/2016 Berdine Dance, MD [facility]   IR GENERIC HISTORICAL  07/26/2016   IR US GUIDE VASC ACCESS RIGHT 07/26/2016 Berdine Dance, MD [facility]   IR GENERIC HISTORICAL  08/02/2016   IR US GUIDE VASC ACCESS RIGHT 08/02/2016 Berdine Dance, MD [facility]   IR GENERIC HISTORICAL  08/02/2016   IR FLUORO GUIDE CV LINE RIGHT 08/02/2016 Berdine Dance, MD [facility]   IR RADIOLOGY PERIPHERAL GUIDED IV START  03/28/2017   IR REMOVAL TUN CV CATH W/O FL  08/11/2020   IR REMOVAL TUN CV CATH W/O FL  11/15/2022   IR THROMBECTOMY AV FISTULA W/THROMBOLYSIS INC/SHUNT/IMG LEFT Left 06/17/2020   IR US GUIDE VASC ACCESS LEFT  06/17/2020   IR US GUIDE VASC ACCESS RIGHT  03/28/2017   IR US GUIDE VASC ACCESS RIGHT  06/17/2020   IR US GUIDE VASC ACCESS RIGHT  11/12/2022   LEFT  HEART CATH AND CORONARY ANGIOGRAPHY N/A 09/20/2016   Procedure: Left Heart Cath and Coronary Angiography;  Surgeon: Lyn Records, MD;  Location: [facility] INVASIVE CV LAB;  Service: Cardiovascular;  Laterality: N/A;   LEFT HEART CATH AND CORS/GRAFTS ANGIOGRAPHY N/A 10/13/2016   Procedure: Left Heart Cath and Cors/Grafts Angiography;  Surgeon: Lennette Bihari, MD;  [facility]: [facility] INV[facility] CV LAB;  Service: Cardiovascular;  Laterality: N/A;   LEFT HEART CATH AND CORS/GRAFTS ANGIOGRAPHY N/A 07/13/2018   Procedure: LEFT HEART CATH AND CORS/GRAFTS ANGIOGRAPHY;  Surgeon: Swaziland, Peter M, MD;  Location: [facility] INVASIVE CV LAB;  Service: Cardiovascular;  Laterality: N/A;   LEFT HEART CATH AND CORS/GRAFTS ANGIOGRAPHY N/A 07/22/2021   Procedure: LEFT HEART CATH AND CORS/GRAFTS ANGIOGRAPHY;  Surgeon: Runell Gess, MD;  Location: [facility] INVASIVE CV LAB;  Service: Cardiovascular;  Laterality: N/A;   LEFT HEART CATHETERIZATION WITH CORONARY ANGIOGRAM N/A 12/15/2011   Procedure: LEFT HEART CATHETERIZATION WITH CORONARY ANGIOGRAM;  Surgeon: Kathleene Hazel, MD;  Location: [facility] CATH LAB;  Service: Cardiovascular;  Laterality: N/A;   LEFT HEART CATHETERIZATION WITH CORONARY ANGIOGRAM N/A 01/10/2012   Procedure: LEFT HEART CATHETERIZATION WITH CORONARY ANGIOGRAM;  Surgeon: Peter M Swaziland, MD;  Location: [facility] CATH LAB;  Service: Cardiovascular;  Laterality: N/A;   LEFT HEART CATHETERIZATION WITH CORONARY ANGIOGRAM N/A 06/08/2012   Procedure: LEFT HEART CATHETERIZATION WITH CORONARY ANGIOGRAM;  Surgeon: Kathleene Hazel, MD;  Location: [facility] CATH LAB;  Service: Cardiovascular;  Laterality: N/A;   LEFT HEART CATHETERIZATION WITH CORONARY/GRAFT ANGIOGRAM N/A 12/10/2013   Procedure: LEFT HEART CATHETERIZATION WITH Isabel Caprice;  Surgeon: Corky Crafts, MD;  Location: [facility] CATH LAB;  Service: Cardiovascular;  Laterality: N/A;   OVARY SURGERY     ovarian cancer   POLYPECTOMY  03/09/2019   Procedure: POLYPECTOMY;  Surgeon:  Malissa Hippo, MD;  [facility];  Service: Endoscopy;;  cecal    POLYPECTOMY N/A 09/26/2019   Procedure: DUODENAL POLYPECTOMY;  Surgeon: Malissa Hippo, MD;  [facility];  Service: Endoscopy;  Laterality: N/A;   POLYPECTOMY  08/11/2022   Procedure: POLYPECTOMY;  Surgeon: Beverley Fiedler, MD;  Location: [facility] ENDOSCOPY;  Service: Gastroenterology;;  REVISION OF ARTERIOVENOUS GORETEX GRAFT N/A 02/24/2017   Procedure: REVISION OF ARTERIOVENOUS GORETEX GRAFT (RESECTION);  Surgeon: Renford Dills, MD;  Location: [facility] ORS;  Service: Vascular;  Laterality: N/A;   REVISON OF ARTERIOVENOUS FISTULA Left 06/19/2020   Procedure: REVISION OF LEFT UPPER ARM AV GRAFT WITH INTERPOSITION JUMP GRAFT USING GORE LIMB;  Surgeon: Cephus Shelling, MD;  Location: [facility] OR;  Service: Vascular;  Laterality: Left;   SHUNTOGRAM N/A 10/15/2013   Procedure: Fistulogram;  Surgeon: Nada Libman, MD;  Location: [facility] CATH LAB;  Service: Cardiovascular;  Laterality: N/A;   SUBMUCOSAL TATTOO INJECTION  11/01/2022   Procedure: SUBMUCOSAL TATTOO INJECTION;  Surgeon: Dolores Frame, MD;  [facility];  Service: Gastroenterology;;   THROMBECTOMY / ARTERIOVENOUS GRAFT REVISION  2011   left upper arm   TUBAL LIGATION  1980's   UPPER EXTREMITY ANGIOGRAPHY Bilateral 12/06/2016   Procedure: Upper Extremity Angiography;  Surgeon: Renford Dills, MD;  Location: [facility] INVASIVE CV LAB;  Service: Cardiovascular;  Laterality: Bilateral;   UPPER EXTREMITY INTERVENTION Left 06/06/2017   Procedure: UPPER EXTREMITY INTERVENTION;  Surgeon: Renford Dills, MD;  Location: [facility] INVASIVE CV LAB;  Service: Cardiovascular;  Laterality: Left;   UPPER EXTREMITY VENOGRAPHY Left 10/04/2022   Procedure: UPPER EXTREMITY VENOGRAPHY;  Surgeon: Renford Dills, MD;  Location: [facility] INVASIVE CV LAB;  Service: Cardiovascular;  Laterality: Left;   Family History:  Family History  Problem Relation Age of  Onset   Heart disease Mother        Heart Disease before age 18   Hyperlipidemia Mother    Hypertension Mother    Diabetes Mother    Heart attack Mother    Heart disease Father        Heart Disease before age 56   Hyperlipidemia Father    Hypertension Father    Diabetes Father    Diabetes Sister    Hypertension Sister    Diabetes Brother    Hyperlipidemia Brother    Heart attack Brother    Hypertension Sister    Heart attack Brother    Colon cancer Child 59   Other Other        noncontributory for early CAD   Esophageal cancer Neg Hx    Liver disease Neg Hx    Kidney disease Neg Hx    Colon polyps Neg Hx    Social History:  Social History   Tobacco Use   Smoking status: Never   Smokeless tobacco: Never  Vaping Use   Vaping status: Never Used  Substance Use Topics   Alcohol use: No    Alcohol/week: 0.0 standard drinks of alcohol   Drug use: No    Home Medications:  Prior to Admission medications   Medication Sig Start Date End Date Taking? Authorizing Provider  ALPRAZolam Prudy Feeler) 0.5 MG tablet Take 1 tablet (0.5 mg total) by mouth daily as needed for up to 4 doses for anxiety. 12/19/22   Lanae Boast, MD  amiodarone (PACERONE) 200 MG tablet Take 200 mg by mouth daily. 11/21/22   [provider]  benzonatate (TESSALON) 100 MG capsule Take 1 capsule (100 mg total) by mouth 3 (three) times daily as needed for cough. 01/18/23   Tilden Fossa, MD  calcitRIOL (ROCALTROL) 0.25 MCG capsule Take 7 capsules (1.75 mcg total) by mouth every Monday, Wednesday, and Friday with hemodialysis. 12/21/22   Lanae Boast, MD  cinacalcet (SENSIPAR) 30 MG tablet Take 1 tablet (30 mg total) by  mouth every Monday, Wednesday, and Friday with hemodialysis. 12/19/22   Lanae Boast, MD  cyclobenzaprine (FLEXERIL) 5 MG tablet Take 5 mg by mouth 3 (three) times daily as needed.    [provider]  folic acid (FOLVITE) 1 MG tablet Take 1 mg by mouth daily. 05/24/22   [provider]  gabapentin (NEURONTIN) 100 MG capsule Take 1 capsule (100 mg total) by mouth every evening. 12/13/22   Jerald Kief, MD  guaiFENesin-codeine 100-10 MG/5ML syrup Take 5 mLs by mouth every 4 (four) hours as needed for cough. 10/23/22   Jacalyn Lefevre, MD  hydrALAZINE (APRESOLINE) 25 MG tablet Take 1 tablet (25 mg total) by mouth 2 (two) times daily. Hold for sbp <110 12/27/22   Lanae Boast, MD  hydrOXYzine (ATARAX) 25 MG tablet Take 25 mg by mouth every 6 (six) hours as needed for itching. 06/06/22   [provider]  levETIRAcetam (KEPPRA) 500 MG tablet Take 1 tablet (500 mg total) by mouth daily. 11/15/22   Leroy Sea, MD  levothyroxine (SYNTHROID) 75 MCG tablet Take 1 tablet (75 mcg total) by mouth daily before breakfast. 08/14/22 12/02/22  Hughie Closs, MD  metoprolol tartrate (LOPRESSOR) 25 MG tablet Take 1 tablet (25 mg total) by mouth 2 (two) times daily. Hold for sbp <110 and hr < 60/min 12/27/22   Lanae Boast, MD  multivitamin (RENA-VIT) TABS tablet Take 1 tablet by mouth daily.    [provider]  nitroGLYCERIN (NITROSTAT) 0.4 MG SL tablet Place 0.4 mg under the tongue every 5 (five) minutes as needed for chest pain.    [provider]  octreotide (SANDOSTATIN LAR) 20 MG injection Inject 20 mg into the muscle every 28 (twenty-eight) days. 11/15/22   Leroy Sea, MD  omeprazole (PRILOSEC) 40 MG capsule Take 1 capsule (40 mg total) by mouth daily. Patient not taking: Reported on 01/28/2023 10/06/22   Aida Raider, NP  ondansetron (ZOFRAN-ODT) 4 MG disintegrating tablet Take 4 mg by mouth every 8 (eight) hours as needed for nausea or vomiting. 05/05/22   [provider]  rosuvastatin (CRESTOR) 10 MG tablet Take 1 tablet by mouth once daily 06/06/22   Ellsworth Lennox, PA-C    Inpatient Medications:  Current Facility-Administered Medications:    0.9 %  sodium chloride infusion (Manually program via Guardrails IV Fluids), , Intravenous, Once,  Kommor, Madison, MD   acetaminophen (TYLENOL) tablet 650 mg, 650 mg, Oral, Q6H PRN, Johnson, Clanford L, MD, 650 mg at 01/28/23 0840   ALPRAZolam (XANAX) tablet 0.5 mg, 0.5 mg, Oral, Daily PRN, Elgergawy, Leana Roe, MD   amiodarone (PACERONE) tablet 200 mg, 200 mg, Oral, Daily, Elgergawy, Leana Roe, MD   benzonatate (TESSALON) capsule 100 mg, 100 mg, Oral, TID PRN, Elgergawy, Leana Roe, MD   [START ON 01/30/2023] calcitRIOL (ROCALTROL) capsule 1.75 mcg, 1.75 mcg, Oral, Q M,W,F-HD, Elgergawy, Leana Roe, MD   Chlorhexidine Gluconate Cloth 2 % PADS 6 each, 6 each, Topical, Q0600, Maxie Barb, MD, 6 each at 01/28/23 0626   [START ON 01/30/2023] cinacalcet (SENSIPAR) tablet 30 mg, 30 mg, Oral, Q M,W,F-HD, Elgergawy, Leana Roe, MD   famotidine (PEPCID) tablet 20 mg, 20 mg, Oral, Daily, Johnson, Clanford L, MD   folic acid (FOLVITE) tablet 1 mg, 1 mg, Oral, Daily, Elgergawy, Leana Roe, MD   gabapentin (NEURONTIN) capsule 100 mg, 100 mg, Oral, QPM, Elgergawy, Leana Roe, MD, 100 mg at 01/27/23 2153   hydrALAZINE (APRESOLINE) injection 5 mg, 5 mg,  Intravenous, Q4H PRN, Elgergawy, Leana Roe, MD, 5 mg at 01/28/23 8657   hydrALAZINE (APRESOLINE) tablet 25 mg, 25 mg, Oral, BID, Elgergawy, Leana Roe, MD, 25 mg at 01/27/23 2153   hydrOXYzine (ATARAX) tablet 25 mg, 25 mg, Oral, Q6H PRN, Elgergawy, Leana Roe, MD   levETIRAcetam (KEPPRA) tablet 500 mg, 500 mg, Oral, Daily, Elgergawy, Leana Roe, MD   levothyroxine (SYNTHROID) tablet 75 mcg, 75 mcg, Oral, Q0600, Elgergawy, Leana Roe, MD, 75 mcg at 01/28/23 0549   metoprolol tartrate (LOPRESSOR) tablet 25 mg, 25 mg, Oral, BID, Elgergawy, Leana Roe, MD   multivitamin (RENA-VIT) tablet 1 tablet, 1 tablet, Oral, Daily, Elgergawy, Leana Roe, MD   nitroGLYCERIN (NITROSTAT) SL tablet 0.4 mg, 0.4 mg, Sublingual, Q5 min PRN, Elgergawy, Leana Roe, MD   octreotide (SANDOSTATIN) injection 100 mcg, 100 mcg, Subcutaneous, TID, Elgergawy, Leana Roe, MD, 100 mcg at 01/28/23 1159   [START ON  01/30/2023] omeprazole (PRILOSEC) capsule 20 mg, 20 mg, Oral, BID AC, Johnson, Clanford L, MD   Oral care mouth rinse, 15 mL, Mouth Rinse, PRN, Johnson, Clanford L, MD   rosuvastatin (CRESTOR) tablet 10 mg, 10 mg, Oral, Daily, Elgergawy, Leana Roe, MD Allergies: Amlodipine, Aspirin, Nitrofurantoin, Ranexa [ranolazine], Bactrim [sulfamethoxazole-trimethoprim], Contrast media [iodinated contrast media], Iron, Gabapentin, Iron sucrose, Sucroferric oxyhydroxide, Levaquin [levofloxacin in d5w], Pantoprazole, Plavix [clopidogrel bisulfate], Protonix [pantoprazole sodium], and Venofer [ferric oxide]  Complete Review of Systems: GENERAL: negative for malaise, night sweats HEENT: No changes in hearing or vision, no nose bleeds or other nasal problems. NECK: Negative for lumps, goiter, pain and significant neck swelling RESPIRATORY: Negative for cough, wheezing CARDIOVASCULAR: Negative for chest pain, leg swelling, palpitations, orthopnea GI: SEE HPI MUSCULOSKELETAL: Negative for joint pain or swelling, back pain, and muscle pain. SKIN: Negative for lesions, rash PSYCH: Negative for sleep disturbance, mood disorder and recent psychosocial stressors. HEMATOLOGY Negative for prolonged bleeding, bruising easily, and swollen nodes. ENDOCRINE: Negative for cold or heat intolerance, polyuria, polydipsia and goiter. NEURO: negative for tremor, gait imbalance, syncope and seizures. The remainder of the review of systems is noncontributory.  Physical Exam: BP (!) 143/48   Pulse (!) 43   Temp 97.6 F (36.4 C) (Axillary)   Resp 16   Ht 5\' 2"  (1.575 m)   Wt 51.7 kg   SpO2 100%   BMI 20.85 kg/m  GENERAL: The patient is Awake bu Aox1-2, in no acute distress. HEENT: Head is normocephalic and atraumatic. EOMI are intact. Mouth is well hydrated and without lesions. NECK: Supple. No masses CHEST: Has HD catheter LUNGS: Clear to auscultation. No presence of rhonchi/wheezing/rales. Adequate chest  expansion HEART: RRR, normal s1 and s2. ABDOMEN: Soft, nontender, no guarding, no peritoneal signs, and nondistended. BS +. No masses. EXTREMITIES: Without any cyanosis, clubbing, rash, lesions or edema. NEUROLOGIC: Aox1-2, no focal motor deficit. SKIN: no jaundice, no rashes  Laboratory Data CBC:     Component Value Date/Time   WBC 6.6 01/27/2023 1640   RBC 1.82 (L) 01/27/2023 1640   HGB 6.4 (LL) 01/27/2023 1640   HCT 20.6 (L) 01/27/2023 1640   PLT 310 01/27/2023 1640   MCV 113.2 (H) 01/27/2023 1640   MCH 35.2 (H) 01/27/2023 1640   MCHC 31.1 01/27/2023 1640   RDW 25.9 (H) 01/27/2023 1640   LYMPHSABS 0.3 (L) 01/27/2023 1640   MONOABS 0.3 01/27/2023 1640   EOSABS 0.0 01/27/2023 1640   BASOSABS 0.1 01/27/2023 1640   COAG:  Lab Results  Component Value Date   INR 1.3 (H) 11/08/2021  INR 1.0 02/03/2021   INR 1.1 06/12/2020    BMP:     Latest Ref Rng & Units 01/27/2023    4:40 PM 01/18/2023    1:57 AM 12/27/2022   12:51 AM  BMP  Glucose 70 - 99 mg/dL 161  84  95   BUN 8 - 23 mg/dL 24  13  16    Creatinine 0.44 - 1.00 mg/dL 0.96  0.45  4.09   Sodium 135 - 145 mmol/L 130  131  131   Potassium 3.5 - 5.1 mmol/L 3.7  3.2  4.1   Chloride 98 - 111 mmol/L 93  93  96   CO2 22 - 32 mmol/L 25  26  23    Calcium 8.9 - 10.3 mg/dL 8.2  8.6  8.5     HEPATIC:     Latest Ref Rng & Units 01/27/2023    4:40 PM 01/18/2023    1:57 AM 12/23/2022   11:24 AM  Hepatic Function  Total Protein 6.5 - 8.1 g/dL 6.8  6.6    Albumin 3.5 - 5.0 g/dL 2.9  2.8  2.3   AST 15 - 41 U/L 8  13    ALT 0 - 44 U/L 10  12    Alk Phosphatase 38 - 126 U/L 83  68    Total Bilirubin 0.3 - 1.2 mg/dL 1.0  0.9      CARDIAC:  Lab Results  Component Value Date   CKTOTAL 47 12/03/2020   CKMB 1.5 05/19/2016   TROPONINI 0.37 (HH) 12/26/2018     Imaging: I personally reviewed and interpreted the available imaging.  Assessment & Plan: 82 y.o. female history of dementia, ESRD on hemodialysis, A-fib on aspirin,  CAD, CHF, recurrent gastric and small bowel AVMs, GERD, HTN, colon cancer s/p resection in 1992, duodenal adenoma, who was admitted to the hospital after being sent from the dialysis center due to severe anemia.  Patient has not presented any overt gastrointestinal bleeding and was incidentally found to have recurrent anemia.  Notably, her most recent iron stores were within normal limits.  Anemia is likely multifactorial at the moment and is not related to large GI bleeding.  I agree that a component of this anemia may be driven by her history of AVMs, which have been difficult to treat as they have recurred multiple times despite ablation.  It is very important to be able to restart octreotide monthly injections with the hope that further chronic losses can be prevented.  For now, will recommend rechecking her H&H on a daily basis and continuing octreotide 100 mcg 3 times daily.  If she presents recurrent anemia or overt gastrointestinal bleeding, we will repeat an EGD/enteroscopy.  We will discuss with the hospitalist the possibility of administering the octreotide 20 mg dosage IM during this hospitalization.  Will need to make sure that she receives the monthly dose of octreotide as outpatient.  Consider palliative care consultation given her multiple comorbidities and advanced dementia.  - Repeat CBC qday, transfuse if Hb <7 - Pantoprazole 40 mg q12h IVP -Octreotide 100 mcg 3 times daily -Will discuss possibility of octreotide 20 mg IM during current hospitalization - 2 large bore IV lines - Active T/S -Heart healthy diet - Avoid NSAIDs - Will proceed with EGD/enteroscopy if she presents persistent anemia or overdressed intestinal bleeding -Consider palliative care consultation  [name], MD Gastroenterology and Hepatology [facility]

## 2023-01-28 NOTE — Plan of Care (Signed)

## 2023-01-28 NOTE — Hospital Course (Addendum)
82 y.o. female, with past medical history significant for hypertension, hyperlipidemia, CAD status post CABG and PCI, paroxysmal A-fib, no longer on anticoagulation due to recurrent GI bleed, ESRD on hemodialysis (MWF), seizure disorder, hypothyroidism.  Patient was sent to ED by HD center for low hemoglobin, patient reports generalized weakness, fatigue, intermittent dark stools, patient with known history of AVM in the past, supposed to be on subcutaneous octreotide which has not been started yet, she had labs done at her HD center today given her generalized weakness and fatigue, for which hemoglobin was noted to be 6.1, so she was sent for further workup, she denies nausea, vomiting, abdominal pain, no fever or chills.   In the ER her workup was significant for sodium of 130, hemoglobin of 6.4, platelet of 310, she was ordered 2 units PRBC, GI has been consulted, as well nephrology has been consulted as she has not had her HD that was due day of admission. [facility] hospitalist consulted to admit.

## 2023-01-28 NOTE — Consult Note (Signed)
[facility] Nephrology Consult Note: Reason for Consult: To manage dialysis and dialysis related needs Referring Physician: Dr. [name], [place]  HPI:  Shawna [name] is an 82 y.o. female with past medical history significant for hypertension, HLD, CAD status post stent, CABG, A-fib, recurrent GI bleed, ESRD on HD MWF who presented with generalized weakness fatigue in the setting of possible GI bleed seen as a consultation for the management surrounding dialysis, ESRD. Patient is known to have history of AVM in the past and supposed to be on octreotide which has not been started yet.  In the ER she was noted to have hemoglobin of 6.4 therefore received 2 units of blood transfusion.  Already seen by GI who plans to monitor with the PPI, octreotide. The patient receives dialysis at [facility] in [facility] Monday Wednesday Friday, reports that she missed treatment yesterday.  She has tunneled HD catheter for the access. She denies headache, dizziness, nausea, vomiting, chest pain, shortness of breath. Today, her blood pressure is 150/54, in room air.  Sodium 130, potassium 3.7, iron saturation 89 with high ferritin level.   Past Medical History:  Diagnosis Date   Acute on chronic respiratory failure with hypoxia (HCC) 10/10/2016   Anxiety    Arthritis    AVM (arteriovenous malformation) of colon    CAD (coronary artery disease)    a. s/p CABG in 2013 b. DES to D1 in 10/2016. c. cath in 07/2018 showing patent grafts with occlusion of D1 at prior stent site and progression of PDA disease --> medical management recommended   Carotid artery disease (HCC)    a. 60-79% LICA, 03/2012    Chronic bronchitis (HCC)    Chronic HFrEF (heart failure with reduced ejection fraction) (HCC)    Colon cancer (HCC) 1992   Esophageal stricture    ESRD on hemodialysis (HCC)    ESRD due to HTN, started dialysis 2011 and gets HD at [facility] with Dr Fausto Skillern on MWF schedule.  Access is LUA AVF as of Sept 2014.     GERD (gastroesophageal reflux disease)    High cholesterol 12/2011   History of blood transfusion 07/2011; 12/2011; 01/2012 X 2; 04/2012   History of gout    History of lower GI bleeding    Hypertension    Iron deficiency anemia    Jugular vein occlusion, right (HCC)    Mitral regurgitation    a. Moderate by echo, 02/2012   Mitral valve disease    NSVT (nonsustained ventricular tachycardia) (HCC)    Ovarian cancer (HCC) 1992   PAF (paroxysmal atrial fibrillation) (HCC)    Pneumonia ~ 2009   PUD (peptic ulcer disease)    TIA (transient ischemic attack)    Tricuspid valve disease     Past Surgical History:  Procedure Laterality Date   A/V FISTULAGRAM Left 10/04/2022   Procedure: A/V Fistulagram;  Surgeon: Renford Dills, MD;  Location: [facility] INVASIVE CV LAB;  Service: Cardiovascular;  Laterality: Left;   A/V SHUNTOGRAM Left 03/19/2019   Procedure: A/V SHUNTOGRAM;  Surgeon: Renford Dills, MD;  Location: [facility] INVASIVE CV LAB;  Service: Cardiovascular;  Laterality: Left;   ABDOMINAL HYSTERECTOMY  1992   APPENDECTOMY  06/1990   AV FISTULA PLACEMENT  07/2009   left upper arm   AV FISTULA PLACEMENT Right 09/06/2016   Procedure: RIGHT FOREARM ARTERIOVENOUS (AV) GRAFT;  Surgeon: Sherren Kerns, MD;  Location: [facility] OR;  Service: Vascular;  Laterality: Right;   AV FISTULA PLACEMENT N/A 02/24/2017  Procedure: INSERTION OF ARTERIOVENOUS (AV) GORE-TEX GRAFT ARM (BRACHIAL AXILLARY);  Surgeon: Renford Dills, MD;  Location: [facility] ORS;  Service: Vascular;  Laterality: N/A;   AVGG REMOVAL Right 09/06/2016   Procedure: REMOVAL OF Right Arm ARTERIOVENOUS GORETEX GRAFT and Vein Patch angioplasty of brachial artery;  Surgeon: Chuck Hint, MD;  Location: [facility] OR;  Service: Vascular;  Laterality: Right;   BIOPSY  09/26/2019   Procedure: BIOPSY;  Surgeon: Malissa Hippo, MD;  [facility];  Service: Endoscopy;;   BIOPSY  11/01/2022   Procedure: BIOPSY;  Surgeon: Dolores Frame, MD;  [facility];  Service: Gastroenterology;;   COLON RESECTION  1992   COLON SURGERY     COLONOSCOPY N/A 03/09/2019   Procedure: COLONOSCOPY;  Surgeon: Malissa Hippo, MD;  [facility];  Service: Endoscopy;  Laterality: N/A;   COLONOSCOPY N/A 08/11/2022   Procedure: COLONOSCOPY;  Surgeon: Beverley Fiedler, MD;  Location: [facility] ENDOSCOPY;  Service: Gastroenterology;  Laterality: N/A;   COLONOSCOPY WITH PROPOFOL N/A 06/08/2021   Procedure: COLONOSCOPY WITH PROPOFOL;  Surgeon: Dolores Frame, MD;  [facility];  Service: Gastroenterology;  Laterality: N/A;  9:05 /Patient is on dialysis Mon Wed Fri   CORONARY ANGIOPLASTY WITH STENT PLACEMENT  12/15/11   "2"   CORONARY ANGIOPLASTY WITH STENT PLACEMENT  y/2013   "1; makes total of 3" (05/02/2012)   CORONARY ARTERY BYPASS GRAFT  06/13/2012   Procedure: CORONARY ARTERY BYPASS GRAFTING (CABG);  Surgeon: Delight Ovens, MD;  Location: [facility] OR;  Service: Open Heart Surgery;  Laterality: N/A;  cabg x four;  using left internal mammary artery, and left leg greater saphenous vein harvested endoscopically   CORONARY STENT INTERVENTION N/A 10/13/2016   Procedure: Coronary Stent Intervention;  Surgeon: Lennette Bihari, MD;  [facility];  Service: Cardiovascular;  Laterality: N/A;   DIALYSIS/PERMA CATHETER REMOVAL N/A 04/18/2017   Procedure: DIALYSIS/PERMA CATHETER REMOVAL;  Surgeon: Renford Dills, MD;  Location: [facility] INVASIVE CV LAB;  Service: Cardiovascular;  Laterality: N/A;   DILATION AND CURETTAGE OF UTERUS     ENTEROSCOPY N/A 06/08/2021   Procedure: PUSH ENTEROSCOPY;  Surgeon: Dolores Frame, MD;  [facility];  Service: Gastroenterology;  Laterality: N/A;   ENTEROSCOPY N/A 11/01/2022   Procedure: ENTEROSCOPY;  Surgeon: Dolores Frame, MD;  [facility];  Service: Gastroenterology;  Laterality: N/A;   ENTEROSCOPY N/A 12/01/2022    Procedure: ENTEROSCOPY;  Surgeon: Benancio Deeds, MD;  Location: [facility] ENDOSCOPY;  Service: Gastroenterology;  Laterality: N/A;   ESOPHAGOGASTRODUODENOSCOPY  01/20/2012   Procedure: ESOPHAGOGASTRODUODENOSCOPY (EGD);  Surgeon: Meryl Dare, MD,FACG;  Location: [facility] ENDOSCOPY;  Service: Endoscopy;  Laterality: N/A;   ESOPHAGOGASTRODUODENOSCOPY N/A 03/26/2013   Procedure: ESOPHAGOGASTRODUODENOSCOPY (EGD);  Surgeon: Hilarie Fredrickson, MD;  Location: [facility] ENDOSCOPY;  Service: Endoscopy;  Laterality: N/A;   ESOPHAGOGASTRODUODENOSCOPY N/A 04/30/2015   Procedure: ESOPHAGOGASTRODUODENOSCOPY (EGD);  Surgeon: Malissa Hippo, MD;  [facility];  Service: Endoscopy;  Laterality: N/A;  1pm - moved to 10/20 @ 1:10   ESOPHAGOGASTRODUODENOSCOPY N/A 07/29/2016   Procedure: ESOPHAGOGASTRODUODENOSCOPY (EGD);  Surgeon: Ruffin Frederick, MD;  Location: [facility] ENDOSCOPY;  Service: Gastroenterology;  Laterality: N/A;  enteroscopy   ESOPHAGOGASTRODUODENOSCOPY N/A 09/26/2019   Procedure: ESOPHAGOGASTRODUODENOSCOPY (EGD);  Surgeon: Malissa Hippo, MD;  [facility];  Service: Endoscopy;  Laterality: N/A;  1250   ESOPHAGOGASTRODUODENOSCOPY N/A 08/11/2022   Procedure: ESOPHAGOGASTRODUODENOSCOPY (EGD);  Surgeon: Beverley Fiedler, MD;  Location: [facility] ENDOSCOPY;  Service: Gastroenterology;  Laterality: N/A;   ESOPHAGOGASTRODUODENOSCOPY (EGD) WITH PROPOFOL N/A 02/05/2021   Procedure: ESOPHAGOGASTRODUODENOSCOPY (EGD) WITH PROPOFOL;  Surgeon: Lanelle Bal, DO;  [facility];  Service: Endoscopy;  Laterality: N/A;   ESOPHAGOGASTRODUODENOSCOPY (EGD) WITH PROPOFOL N/A 08/27/2022   Procedure: ESOPHAGOGASTRODUODENOSCOPY (EGD) WITH PROPOFOL;  Surgeon: Lanelle Bal, DO;  [facility];  Service: Endoscopy;  Laterality: N/A;   GIVENS CAPSULE STUDY N/A 03/07/2019   Procedure: GIVENS CAPSULE STUDY;  Surgeon: Malissa Hippo, MD;  [facility];  Service: Endoscopy;  Laterality: N/A;  7:30    GIVENS CAPSULE STUDY N/A 04/22/2021   Procedure: GIVENS CAPSULE STUDY;  Surgeon: Malissa Hippo, MD;  [facility];  Service: Endoscopy;  Laterality: N/A;  7:30   GIVENS CAPSULE STUDY N/A 08/27/2022   Procedure: GIVENS CAPSULE STUDY;  Surgeon: Lanelle Bal, DO;  [facility];  Service: Endoscopy;  Laterality: N/A;   HOT HEMOSTASIS N/A 08/11/2022   Procedure: HOT HEMOSTASIS (ARGON PLASMA COAGULATION/BICAP);  Surgeon: Beverley Fiedler, MD;  Location: [facility] ENDOSCOPY;  Service: Gastroenterology;  Laterality: N/A;   HOT HEMOSTASIS N/A 12/01/2022   Procedure: HOT HEMOSTASIS (ARGON PLASMA COAGULATION/BICAP);  Surgeon: Benancio Deeds, MD;  Location: [facility] ENDOSCOPY;  Service: Gastroenterology;  Laterality: N/A;   INSERTION OF DIALYSIS CATHETER N/A 10/05/2020   Procedure: ABORTED TUNNELED DIALYSIS CATHETER PLACEMENT RIGHT INTERNAL JUGULAR VEIN ;  Surgeon: Lucretia Roers, MD;  Location: [facility];  Service: General;  Laterality: N/A;   INTRAOPERATIVE TRANSESOPHAGEAL ECHOCARDIOGRAM  06/13/2012   Procedure: INTRAOPERATIVE TRANSESOPHAGEAL ECHOCARDIOGRAM;  Surgeon: Delight Ovens, MD;  Location: [facility] OR;  Service: Open Heart Surgery;  Laterality: N/A;   IR DIALY SHUNT INTRO NEEDLE/INTRACATH INITIAL W/IMG LEFT Left 10/06/2020   IR FLUORO GUIDE CV LINE RIGHT  06/17/2020   IR FLUORO GUIDE CV LINE RIGHT  11/12/2022   IR GENERIC HISTORICAL  07/26/2016   IR FLUORO GUIDE CV LINE RIGHT 07/26/2016 Berdine Dance, MD [facility]   IR GENERIC HISTORICAL  07/26/2016   IR US GUIDE VASC ACCESS RIGHT 07/26/2016 Berdine Dance, MD [facility]   IR GENERIC HISTORICAL  08/02/2016   IR US GUIDE VASC ACCESS RIGHT 08/02/2016 Berdine Dance, MD [facility]   IR GENERIC HISTORICAL  08/02/2016   IR FLUORO GUIDE CV LINE RIGHT 08/02/2016 Berdine Dance, MD [facility]   IR RADIOLOGY PERIPHERAL GUIDED IV START  03/28/2017   IR REMOVAL TUN CV CATH W/O FL  08/11/2020   IR REMOVAL TUN CV CATH W/O FL  11/15/2022   IR  THROMBECTOMY AV FISTULA W/THROMBOLYSIS INC/SHUNT/IMG LEFT Left 06/17/2020   IR US GUIDE VASC ACCESS LEFT  06/17/2020   IR US GUIDE VASC ACCESS RIGHT  03/28/2017   IR US GUIDE VASC ACCESS RIGHT  06/17/2020   IR US GUIDE VASC ACCESS RIGHT  11/12/2022   LEFT HEART CATH AND CORONARY ANGIOGRAPHY N/A 09/20/2016   Procedure: Left Heart Cath and Coronary Angiography;  Surgeon: Lyn Records, MD;  Location: [facility] INVASIVE CV LAB;  Service: Cardiovascular;  Laterality: N/A;   LEFT HEART CATH AND CORS/GRAFTS ANGIOGRAPHY N/A 10/13/2016   Procedure: Left Heart Cath and Cors/Grafts Angiography;  Surgeon: Lennette Bihari, MD;  [facility];  Service: Cardiovascular;  Laterality: N/A;   LEFT HEART CATH AND CORS/GRAFTS ANGIOGRAPHY N/A 07/13/2018   Procedure: LEFT HEART CATH AND CORS/GRAFTS ANGIOGRAPHY;  Surgeon: Swaziland, Peter M,  MD;  [facility];  Service: Cardiovascular;  Laterality: N/A;   LEFT HEART CATH AND CORS/GRAFTS ANGIOGRAPHY N/A 07/22/2021   Procedure: LEFT HEART CATH AND CORS/GRAFTS ANGIOGRAPHY;  Surgeon: Runell Gess, MD;  [facility];  Service: Cardiovascular;  Laterality: N/A;   LEFT HEART CATHETERIZATION WITH CORONARY ANGIOGRAM N/A 12/15/2011   Procedure: LEFT HEART CATHETERIZATION WITH CORONARY ANGIOGRAM;  Surgeon: Kathleene Hazel, MD;  Location: [facility] CATH LAB;  Service: Cardiovascular;  Laterality: N/A;   LEFT HEART CATHETERIZATION WITH CORONARY ANGIOGRAM N/A 01/10/2012   Procedure: LEFT HEART CATHETERIZATION WITH CORONARY ANGIOGRAM;  Surgeon: Peter M Swaziland, MD;  Location: [facility] CATH LAB;  Service: Cardiovascular;  Laterality: N/A;   LEFT HEART CATHETERIZATION WITH CORONARY ANGIOGRAM N/A 06/08/2012   Procedure: LEFT HEART CATHETERIZATION WITH CORONARY ANGIOGRAM;  Surgeon: Kathleene Hazel, MD;  Location: [facility] CATH LAB;  Service: Cardiovascular;  Laterality: N/A;   LEFT HEART CATHETERIZATION WITH CORONARY/GRAFT ANGIOGRAM N/A 12/10/2013   Procedure: LEFT  HEART CATHETERIZATION WITH Isabel Caprice;  Surgeon: Corky Crafts, MD;  Location: [facility] CATH LAB;  Service: Cardiovascular;  Laterality: N/A;   OVARY SURGERY     ovarian cancer   POLYPECTOMY  03/09/2019   Procedure: POLYPECTOMY;  Surgeon: Malissa Hippo, MD;  [facility];  Service: Endoscopy;;  cecal    POLYPECTOMY N/A 09/26/2019   Procedure: DUODENAL POLYPECTOMY;  Surgeon: Malissa Hippo, MD;  [facility];  Service: Endoscopy;  Laterality: N/A;   POLYPECTOMY  08/11/2022   Procedure: POLYPECTOMY;  Surgeon: Beverley Fiedler, MD;  Location: [facility] ENDOSCOPY;  Service: Gastroenterology;;   REVISION OF ARTERIOVENOUS GORETEX GRAFT N/A 02/24/2017   Procedure: REVISION OF ARTERIOVENOUS GORETEX GRAFT (RESECTION);  Surgeon: Renford Dills, MD;  Location: [facility] ORS;  Service: Vascular;  Laterality: N/A;   REVISON OF ARTERIOVENOUS FISTULA Left 06/19/2020   Procedure: REVISION OF LEFT UPPER ARM AV GRAFT WITH INTERPOSITION JUMP GRAFT USING GORE LIMB;  Surgeon: Cephus Shelling, MD;  Location: [facility] OR;  Service: Vascular;  Laterality: Left;   SHUNTOGRAM N/A 10/15/2013   Procedure: Fistulogram;  Surgeon: Nada Libman, MD;  Location: [facility] CATH LAB;  Service: Cardiovascular;  Laterality: N/A;   SUBMUCOSAL TATTOO INJECTION  11/01/2022   Procedure: SUBMUCOSAL TATTOO INJECTION;  Surgeon: Dolores Frame, MD;  [facility];  Service: Gastroenterology;;   THROMBECTOMY / ARTERIOVENOUS GRAFT REVISION  2011   left upper arm   TUBAL LIGATION  1980's   UPPER EXTREMITY ANGIOGRAPHY Bilateral 12/06/2016   Procedure: Upper Extremity Angiography;  Surgeon: Renford Dills, MD;  Location: [facility] INVASIVE CV LAB;  Service: Cardiovascular;  Laterality: Bilateral;   UPPER EXTREMITY INTERVENTION Left 06/06/2017   Procedure: UPPER EXTREMITY INTERVENTION;  Surgeon: Renford Dills, MD;  Location: [facility] INVASIVE CV LAB;  Service: Cardiovascular;  Laterality: Left;   UPPER  EXTREMITY VENOGRAPHY Left 10/04/2022   Procedure: UPPER EXTREMITY VENOGRAPHY;  Surgeon: Renford Dills, MD;  Location: [facility] INVASIVE CV LAB;  Service: Cardiovascular;  Laterality: Left;    Family History  Problem Relation Age of Onset   Heart disease Mother        Heart Disease before age 71   Hyperlipidemia Mother    Hypertension Mother    Diabetes Mother    Heart attack Mother    Heart disease Father        Heart Disease before age 94   Hyperlipidemia Father    Hypertension Father  Diabetes Father    Diabetes Sister    Hypertension Sister    Diabetes Brother    Hyperlipidemia Brother    Heart attack Brother    Hypertension Sister    Heart attack Brother    Colon cancer Child 25   Other Other        noncontributory for early CAD   Esophageal cancer Neg Hx    Liver disease Neg Hx    Kidney disease Neg Hx    Colon polyps Neg Hx     Social History:  reports that she has never smoked. She has never used smokeless tobacco. She reports that she does not drink alcohol and does not use drugs.  Allergies:  Allergies  Allergen Reactions   Amlodipine Swelling   Aspirin Other (See Comments)    High Doses Mess up her stomach; "makes my bowels have blood in them". Takes 81 mg EC Aspirin    Nitrofurantoin Hives   Ranexa [Ranolazine] Other (See Comments)    Myoclonus-hospitalized    Bactrim [Sulfamethoxazole-Trimethoprim] Rash   Contrast Media [Iodinated Contrast Media] Itching   Iron Itching and Other (See Comments)    "they gave me iron in dialysis; had to give me Benadryl cause I had to have the iron" (05/02/2012)   Gabapentin Other (See Comments)    Unknown reaction   Iron Sucrose Other (See Comments)    Unknown   Sucroferric Oxyhydroxide Other (See Comments)    Unknown   Levaquin [Levofloxacin In D5w] Rash   Pantoprazole Rash   Plavix [Clopidogrel Bisulfate] Rash   Protonix [Pantoprazole Sodium] Rash   Venofer [Ferric Oxide] Itching and Other (See Comments)     Patient reports using Benadryl prior to doses as [facility]    Medications: I have reviewed the patient's current medications.   Results for orders placed or performed during the hospital encounter of 01/27/23 (from the past 48 hour(s))  Prepare RBC (crossmatch)     Status: None   Collection Time: 01/27/23  3:42 PM  Result Value Ref Range   Order Confirmation      ORDER PROCESSED BY BLOOD BANK Performed at [facility], [address], [place], [place] [address]   CBC with Differential     Status: Abnormal   Collection Time: 01/27/23  4:40 PM  Result Value Ref Range   WBC 6.6 4.0 - 10.5 K/uL    Comment: REPEATED TO VERIFY WHITE COUNT CONFIRMED ON SMEAR    RBC 1.82 (L) 3.87 - 5.11 MIL/uL   Hemoglobin 6.4 (LL) 12.0 - 15.0 g/dL    Comment: REPEATED TO VERIFY THIS CRITICAL RESULT HAS VERIFIED AND BEEN CALLED TO GANTT, EMILY BY ASHLEY FRATTO ON 07 19 2024 AT 1700, AND HAS BEEN READ BACK.     HCT 20.6 (L) 36.0 - 46.0 %   MCV 113.2 (H) 80.0 - 100.0 fL   MCH 35.2 (H) 26.0 - 34.0 pg   MCHC 31.1 30.0 - 36.0 g/dL   RDW 64.3 (H) 32.9 - 51.8 %   Platelets 310 150 - 400 K/uL   nRBC 0.0 0.0 - 0.2 %   Neutrophils Relative % 88 %   Neutro Abs 5.8 1.7 - 7.7 K/uL   Lymphocytes Relative 5 %   Lymphs Abs 0.3 (L) 0.7 - 4.0 K/uL   Monocytes Relative 5 %   Monocytes Absolute 0.3 0.1 - 1.0 K/uL   Eosinophils Relative 0 %   Eosinophils Absolute 0.0 0.0 - 0.5 K/uL   Basophils Relative  1 %   Basophils Absolute 0.1 0.0 - 0.1 K/uL   WBC Morphology MORPHOLOGY UNREMARKABLE    RBC Morphology See Note     Comment: MACROCYTOSIS   Smear Review See Note     Comment: FEW LARGE PLATELETS OBSERVED ON SMEAR   Metamyelocytes Relative 1 %   Abs Immature Granulocytes 0.10 (H) 0.00 - 0.07 K/uL   Schistocytes PRESENT    Dimorphism PRESENT    Polychromasia PRESENT    Ovalocytes PRESENT     Comment: Performed at [facility], [address] [place], [place] [address]  Comprehensive metabolic panel      Status: Abnormal   Collection Time: 01/27/23  4:40 PM  Result Value Ref Range   Sodium 130 (L) 135 - 145 mmol/L   Potassium 3.7 3.5 - 5.1 mmol/L   Chloride 93 (L) 98 - 111 mmol/L   CO2 25 22 - 32 mmol/L   Glucose, Bld 150 (H) 70 - 99 mg/dL    Comment: Glucose reference range applies only to samples taken after fasting for at least 8 hours.   BUN 24 (H) 8 - 23 mg/dL   Creatinine, Ser 6.04 (H) 0.44 - 1.00 mg/dL   Calcium 8.2 (L) 8.9 - 10.3 mg/dL   Total Protein 6.8 6.5 - 8.1 g/dL   Albumin 2.9 (L) 3.5 - 5.0 g/dL   AST 8 (L) 15 - 41 U/L   ALT 10 0 - 44 U/L   Alkaline Phosphatase 83 38 - 126 U/L   Total Bilirubin 1.0 0.3 - 1.2 mg/dL   GFR, Estimated 7 (L) >60 mL/min    Comment: (NOTE) Calculated using the CKD-EPI Creatinine Equation (2021)    Anion gap 12 5 - 15    Comment: Performed at [facility], [address], [place], [place] [address]  Type and screen [facility]     Status: None (Preliminary result)   Collection Time: 01/27/23  4:40 PM  Result Value Ref Range   ABO/RH(D) O POS    Antibody Screen NEG    Sample Expiration 01/30/2023,2359    Unit Number [id number]    Blood Component Type RED CELLS,LR    Unit division 00    Status of Unit ISSUED,FINAL    Transfusion Status OK TO TRANSFUSE    Crossmatch Result      COMPATIBLE Performed at [facility], [address], [place], [place] [address]    Unit Number [id number]    Blood Component Type RED CELLS,LR    Unit division 00    Status of Unit ISSUED    Transfusion Status OK TO TRANSFUSE    Crossmatch Result COMPATIBLE   Iron and TIBC     Status: Abnormal   Collection Time: 01/27/23  5:47 PM  Result Value Ref Range   Iron 135 28 - 170 ug/dL   TIBC 324 (L) 401 - 027 ug/dL   Saturation Ratios 89 (H) 10.4 - 31.8 %   UIBC 17 ug/dL    Comment: Performed at [facility], [address], [place], [place] [address]  Ferritin (Iron Binding Protein)     Status: Abnormal   Collection Time: 01/27/23  5:47 PM   Result Value Ref Range   Ferritin 1,469 (H) 11 - 307 ng/mL    Comment: RESULT CONFIRMED BY AUTOMATED DILUTION Performed at [facility], [address], [place], [place] [address]    *Note: Due to a large number of results and/or encounters for the requested time period, some results have  not been displayed. A complete set of results can be found in Results Review.    No results found.  ROS: As per H&P, rest of the systems reviewed and negative. Blood pressure (!) 150/54, pulse (!) 44, temperature 97.6 F (36.4 C), temperature source Axillary, resp. rate 16, height 5\' 2"  (1.575 m), weight 51.7 kg, SpO2 99%. Gen: NAD, comfortable Respiratory: Clear bilateral, no wheezing or crackle Cardiovascular: Regular rate rhythm S1-S2 normal, no rubs GI: Abdomen soft, nontender, nondistended Extremities, no cyanosis or clubbing, no edema Skin: No rash or ulcer Neurology: Alert, awake, following commands, oriented Dialysis Access: TDC, site clean.  Assessment/Plan:  # Anemia due to acute blood loss anemia/GI bleed and CKD: She has a history of AVM and supposed to be on octreotide which she has not been getting.  Seen by GI who is planning to monitor with octreotide, PPI.  Already received 2 units of blood transfusion.  Iron saturation and ferritin level high.  We will dose of Aranesp today.  Avoid heparin.  # ESRD: MWF, missed dialysis yesterday.  We will plan to do HD today or tomorrow depending on dialysis nurses schedule/staffing.  Electrolytes and volume status looks acceptable.  # Hypertension: UF with HD, resume home medications.  Fluid restriction.  # Metabolic Bone Disease: Monitor calcium, phosphorus.  Obtain outpatient record.  Discussed with the dialysis nurse.  Thank you for the consult.  Bailie Christenbury Jaynie Collins 01/28/2023, 12:46 PM

## 2023-01-28 NOTE — Progress Notes (Signed)
Ordered RBC given during the night, PIV infiltrated during 2nd bag. New PIV inserted with the use of ultrasound machine. No c/o pain noted.

## 2023-01-28 NOTE — Progress Notes (Addendum)
PROGRESS NOTE   Shawna [name]  NGE:[id number] DOB: [birth year] DOA: 01/27/2023 PCP: [facility]   Chief Complaint  Patient presents with   Abnormal Lab   Level of care: Telemetry  Brief Admission History:  82 y.o. female, with past medical history significant for hypertension, hyperlipidemia, CAD status post CABG and PCI, paroxysmal A-fib, no longer on anticoagulation due to recurrent GI bleed, ESRD on hemodialysis (MWF), seizure disorder, hypothyroidism, history of pulmonary.  -Patient was sent to ED by HD center for low hemoglobin, patient reports generalized weakness, fatigue, intermittent dark stools, patient with known history of AVM in the past, supposed to be on subcutaneous octreotide which has not been started yet, she had labs done at her HD center today given her generalized weakness and fatigue, for which hemoglobin was noted to be 6.1, so she was sent for further workup, she denies nausea, vomiting, abdominal pain, no fever or chills -in  ER her workup was significant for sodium of 130, hemoglobin of 6.4, platelet of 310, she was ordered 2 units PRBC, GI has been consulted, as well renal has been consulted as she has not had her HD today, [facility] hospitalist consulted to admit.   Assessment and Plan:  Acute on chronic blood loss anemia Upper GI bleed secondary to known AVMs -Presented with symptomatic anemia, melena, this is most likely in the setting of known AVM of small bowel -Started on octreotide per GI recommendation (it has been on as outpatient as well, unclear why patient has not been receiving it.) -She had small bowel in uroscopy with duodenal diverticulum, with 6 nonbleeding angioplastic lesions in the proximal jejunum which has been treated with argon plasma coagulation in the past, GI has been consulted, will defer further workup and need for repeat enteroscopy to GI -s/p 2 units PRBC -SCD for DVT prophylaxis -she is started on clear liquid diet for now  until GI eval  -CBC for this morning is still pending.    ESRD -she appears volume overloaded, especially after receiving 2 units PRBC -HD planned for this morning per nephrology team for volume removal  Atrial tachycardia/paroxysmal atrial fibrillation/flutter/?WCT: -Has been seen by cardiology in the past, continue rhythm control medication with amiodarone and metoprolol -Not on anticoagulation secondary to recurrent GI bleed   Chronic systolic CHF with EF 30-35% CAD status post CABG with residual small vessel disease Chest pain due to demand ischemia: -Volume management with HD -No anticoagulation or antiplatelet therapy secondary to recurrent GI bleed, she denies any chest pain  Uncontrolled Hypertension - suspect her BP elevation is related to volume overload secondary to 2 units PRBC and missing HD yesterday.  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   DVT prophylaxis: SCDs Code Status: Full  Family Communication:  Disposition: anticipate DC home when ok with GI service   Consultants:  GI Nephrology  Procedures:  Hemodialysis  Antimicrobials:    Subjective: Pt complaining of headaches, says she rarely has headaches, no SOB or chest pain symptoms.   Objective: Vitals:   01/28/23 0156 01/28/23 0225 01/28/23 0536 01/28/23 0813  BP: (!) 161/72 (!) 170/73 (!) 185/71 (!) 171/62  Pulse: (!) 45 (!) 47 (!) 45 (!) 48  Resp: 16 16    Temp: (!) 97.5 F (  36.4 C) (!) 97.5 F (36.4 C) 97.6 F (36.4 C)   TempSrc: Axillary  Axillary   SpO2: 100%  100%   Weight:      Height:        Intake/Output Summary (Last 24 hours) at 01/28/2023 0926 Last data filed at 01/28/2023 0536 Gross per 24 hour  Intake 727 ml  Output --  Net 727 ml    Filed Weights   01/27/23 1322  Weight: 51.7 kg   Examination:  General exam: elderly frail female, awake, alert, cooperative, Appears calm and chronically ill.  Respiratory system:  Respiratory effort normal. Cardiovascular system: normal S1 & S2 heard. Trace pedal edema. Gastrointestinal system: Abdomen is nondistended, soft and nontender. No organomegaly or masses felt. Normal bowel sounds heard. Central nervous system: Alert and oriented. No focal neurological deficits. Extremities: 1+ pretibial edema BLEs. Symmetric 5 x 5 power. Skin: No rashes, lesions or ulcers. Psychiatry: Judgement and insight appear normal. Mood & affect appropriate.   Data Reviewed: I have personally reviewed following labs and imaging studies  CBC: Recent Labs  Lab 01/27/23 1640  WBC 6.6  NEUTROABS 5.8  HGB 6.4*  HCT 20.6*  MCV 113.2*  PLT 310    Basic Metabolic Panel: Recent Labs  Lab 01/27/23 1640  NA 130*  K 3.7  CL 93*  CO2 25  GLUCOSE 150*  BUN 24*  CREATININE 5.50*  CALCIUM 8.2*    CBG: No results for input(s): "GLUCAP" in the last 168 hours.  No results found for this or any previous visit (from the past 240 hour(s)).   Radiology Studies: No results found.  Scheduled Meds:  sodium chloride   Intravenous Once   amiodarone  200 mg Oral Daily   [START ON 01/30/2023] calcitRIOL  1.75 mcg Oral Q M,W,F-HD   Chlorhexidine Gluconate Cloth  6 each Topical Q0600   [START ON 01/30/2023] cinacalcet  30 mg Oral Q M,W,F-HD   folic acid  1 mg Oral Daily   gabapentin  100 mg Oral QPM   hydrALAZINE  25 mg Oral BID   levETIRAcetam  500 mg Oral Daily   levothyroxine  75 mcg Oral Q0600   metoprolol tartrate  25 mg Oral BID   multivitamin  1 tablet Oral Daily   octreotide  100 mcg Subcutaneous TID   omeprazole  20 mg Oral BID AC   rosuvastatin  10 mg Oral Daily   Continuous Infusions:   LOS: 1 day   Time spent: 37 mins  [name] [name], MD How to contact the [facility] Attending or  Consulting provider 7A - 7P or covering provider during after hours 7P -7A, for this patient?  Check the care team in [facility] and look for a) attending/consulting TRH provider listed and b) the [facility] team listed Log into [email or web address] and use [facility] universal password to access. If you do not have the password, please contact the hospital operator. Locate the [facility] provider you are looking for under [facility] and page to a number that you can be directly reached. If you still have difficulty reaching the provider, please page the [facility] (Director on Call) for the Hospitalists listed on amion for assistance.  01/28/2023, 9:26 AM

## 2023-01-28 NOTE — Progress Notes (Signed)
   01/28/23 1121  TOC Brief Assessment  Insurance and Status Reviewed  Patient has primary care physician Yes  Home environment has been reviewed Home with spouse  Prior level of function: need assistance  Prior/Current Home Services No current home services  Social Determinants of Health Reivew SDOH reviewed no interventions necessary  Readmission risk has been reviewed Yes  Transition of care needs no transition of care needs at this time    Well know to [facility], HD patient, recently at [facility]. TOC folllowing.

## 2023-01-29 ENCOUNTER — Other Ambulatory Visit (INDEPENDENT_AMBULATORY_CARE_PROVIDER_SITE_OTHER): Payer: Self-pay | Admitting: Gastroenterology

## 2023-01-29 ENCOUNTER — Telehealth (INDEPENDENT_AMBULATORY_CARE_PROVIDER_SITE_OTHER): Payer: Self-pay | Admitting: Gastroenterology

## 2023-01-29 DIAGNOSIS — K5521 Angiodysplasia of colon with hemorrhage: Secondary | ICD-10-CM | POA: Diagnosis not present

## 2023-01-29 DIAGNOSIS — K5520 Angiodysplasia of colon without hemorrhage: Secondary | ICD-10-CM

## 2023-01-29 MED ORDER — HYDRALAZINE HCL 20 MG/ML IJ SOLN
10.0000 mg | INTRAMUSCULAR | Status: DC | PRN
  Administered 2023-01-29 – 2023-02-01 (×2): 10 mg via INTRAVENOUS
  Filled 2023-01-29 (×2): qty 1

## 2023-01-29 MED ORDER — CHLORHEXIDINE GLUCONATE CLOTH 2 % EX PADS
6.0000 | MEDICATED_PAD | Freq: Every day | CUTANEOUS | Status: DC
  Administered 2023-01-29 – 2023-02-01 (×2): 6 via TOPICAL

## 2023-01-29 NOTE — Progress Notes (Signed)
PROGRESS NOTE   Shawna [name]  ZOX:[id number] DOB: [birth year] DOA: 01/27/2023 PCP: [facility]   Chief Complaint  Patient presents with   Abnormal Lab   Level of care: Telemetry  Brief Admission History:  82 y.o. female, with past medical history significant for hypertension, hyperlipidemia, CAD status post CABG and PCI, paroxysmal A-fib, no longer on anticoagulation due to recurrent GI bleed, ESRD on hemodialysis (MWF), seizure disorder, hypothyroidism.  Patient was sent to ED by HD center for low hemoglobin, patient reports generalized weakness, fatigue, intermittent dark stools, patient with known history of AVM in the past, supposed to be on subcutaneous octreotide which has not been started yet, she had labs done at her HD center today given her generalized weakness and fatigue, for which hemoglobin was noted to be 6.1, so she was sent for further workup, she denies nausea, vomiting, abdominal pain, no fever or chills.   In the ER her workup was significant for sodium of 130, hemoglobin of 6.4, platelet of 310, she was ordered 2 units PRBC, GI has been consulted, as well nephrology has been consulted as she has not had her HD that was due day of admission. [facility] hospitalist consulted to admit.   Assessment and Plan:  Acute on chronic blood loss anemia Upper GI bleed secondary to known AVMs -Presented with symptomatic anemia, melena, this is most likely in the setting of known AVM of small bowel -Started on octreotide per GI recommendation - long acting monthly injections -She had small bowel in uroscopy with duodenal diverticulum, with 6 nonbleeding angioplastic lesions in the proximal jejunum which has been treated with argon plasma coagulation in the past, GI has been consulted, will defer further workup and need for repeat enteroscopy to GI -s/p 2 units PRBC -SCD for DVT prophylaxis -tolerating heart healthy diet -CBC for this morning is still pending as patient refused  lab draw.  -Plan is to enroll her into the infusion clinic for monthly long acting octreotide injections.    ESRD -she received 2 units PRBC on 7/20 -HD per nephrology team for volume removal  Atrial tachycardia/paroxysmal atrial fibrillation/flutter/?WCT: -Has been seen by cardiology in the past, continue rhythm control medication with amiodarone and metoprolol -Not on anticoagulation secondary to recurrent GI bleed   Chronic systolic CHF with EF 30-35% CAD status post CABG with residual small vessel disease Chest pain due to demand ischemia: -Volume management with HD -No anticoagulation or antiplatelet therapy secondary to recurrent GI bleed, she denies any chest pain  Uncontrolled Hypertension - suspect her BP elevation is related to volume overload secondary to 2 units PRBC and missing HD yesterday.  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   DVT prophylaxis: SCDs Code Status: Full  Family Communication: husband  Disposition: anticipate DC home when ok with GI service   Consultants:  GI Nephrology  Procedures:  Hemodialysis  Antimicrobials:    Subjective: Pt refusing lab draw this morning.     Objective: Vitals:   01/29/23 0600 01/29/23 0615 01/29/23 0824 01/29/23 1009  BP: (!) 161/64 (!) 165/99  (!) 192/68  Pulse:  60  (!) 55  Resp: 13 13  16   Temp:  98.4 F (36.9 C)  98.3 F (36.8 C)  TempSrc:  Oral  Oral  SpO2:  97%  99%  Weight:   50.2 kg   Height:        Intake/Output Summary (Last 24 hours) at 01/29/2023 1105 Last data filed at 01/29/2023 0615 Gross per 24 hour  Intake 480 ml  Output 1700 ml  Net -1220 ml   Filed Weights   01/27/23 1322 01/29/23 0200 01/29/23 0824  Weight: 51.7 kg 51.9 kg 50.2 kg    Examination:  General exam: elderly frail female, awake, alert, cooperative, Appears calm and chronically ill.  Respiratory system:  Respiratory effort normal. Cardiovascular system: normal S1 & S2 heard. Trace pedal edema. Gastrointestinal system: Abdomen is nondistended, soft and nontender. No organomegaly or masses felt. Normal bowel sounds heard. Central nervous system: Alert and oriented x 2. No focal neurological deficits. Extremities: 1+ pretibial edema BLEs. Symmetric 5 x 5 power. Skin: No rashes, lesions or ulcers. Psychiatry: Judgement and insight appear poor. Mood & affect appropriate.   Data Reviewed: I have personally reviewed following labs and imaging studies  CBC: Recent Labs  Lab 01/27/23 1640 01/28/23 1651  WBC 6.6 7.2  NEUTROABS 5.8  --   HGB 6.4* 12.5  HCT 20.6* 38.1  MCV 113.2* 100.0  PLT 310 252    Basic Metabolic Panel: Recent Labs  Lab 01/27/23 1640 01/28/23 1651  NA 130* 130*  K 3.7 3.9  CL 93* 92*  CO2 25 25  GLUCOSE 150* 151*  BUN 24* 29*  CREATININE 5.50* 6.26*  CALCIUM 8.2* 8.3*    CBG: No results for input(s): "GLUCAP" in the last 168 hours.  Recent Results (from the past 240 hour(s))  MRSA Next Gen by PCR, Nasal     Status: None   Collection Time: 01/28/23  6:05 PM   Specimen: Nasal Mucosa; Nasal Swab  Result Value Ref Range Status   MRSA by PCR Next Gen NOT DETECTED NOT DETECTED Final    Comment: (NOTE) The GeneXpert MRSA Assay (FDA approved for NASAL specimens only), is one component of a comprehensive MRSA colonization surveillance program. It is not intended to diagnose MRSA infection nor to guide or monitor treatment for MRSA infections. Test performance is not FDA approved in patients less than 52 years old. Performed at [facility], [address], [place], [place] [address]      Radiology Studies: No results found.  Scheduled Meds:  sodium chloride   Intravenous Once   amiodarone  200 mg Oral Daily   [START  ON 01/30/2023] calcitRIOL  1.75 mcg Oral Q M,W,F-HD   Chlorhexidine Gluconate Cloth  6 each Topical Q0600   Chlorhexidine Gluconate Cloth  6 each Topical Q0600   [START ON 01/30/2023] cinacalcet  30 mg Oral Q M,W,F-HD   folic acid  1 mg Oral Daily   gabapentin  100 mg Oral QPM   hydrALAZINE  25 mg Oral BID   irbesartan  150 mg Oral Daily   levETIRAcetam  500 mg Oral Daily   levothyroxine  75 mcg Oral Q0600   metoprolol tartrate  25 mg Oral BID   multivitamin  1 tablet Oral Daily   octreotide  100 mcg Subcutaneous TID   [START ON 01/30/2023] omeprazole  20 mg Oral BID AC   rosuvastatin  10 mg Oral Daily   Continuous Infusions:   LOS: 2 days   Time spent: 35 mins  Leslee Suire [name], MD How to contact the [facility] Attending or Consulting provider 7A - 7P or covering provider during after hours 7P -7A, for this  patient?  Check the care team in [facility] and look for a) attending/consulting TRH provider listed and b) the [facility] team listed Log into [email or web address] and use 's universal password to access. If you do not have the password, please contact the hospital operator. Locate the [facility] provider you are looking for under [facility] and page to a number that you can be directly reached. If you still have difficulty reaching the provider, please page the [facility] (Director on Call) for the Hospitalists listed on amion for assistance.  01/29/2023, 11:05 AM

## 2023-01-29 NOTE — Plan of Care (Signed)
  Problem: Education: Goal: Knowledge of General Education information will improve Description: Including pain rating scale, medication(s)/side effects and non-pharmacologic comfort measures Outcome: Not Progressing   Problem: Health Behavior/Discharge Planning: Goal: Ability to manage health-related needs will improve Outcome: Not Progressing   Problem: Clinical Measurements: Goal: Ability to maintain clinical measurements within normal limits will improve Outcome: Progressing Goal: Will remain free from infection Outcome: Progressing Goal: Diagnostic test results will improve Outcome: Progressing Goal: Respiratory complications will improve Outcome: Progressing Goal: Cardiovascular complication will be avoided Outcome: Progressing   Problem: Activity: Goal: Risk for activity intolerance will decrease Outcome: Progressing   Problem: Nutrition: Goal: Adequate nutrition will be maintained Outcome: Progressing   Problem: Coping: Goal: Level of anxiety will decrease Outcome: Progressing   Problem: Elimination: Goal: Will not experience complications related to bowel motility Outcome: Progressing Goal: Will not experience complications related to urinary retention Outcome: Not Progressing Note: ESRD HD patient   Problem: Pain Managment: Goal: General experience of comfort will improve Outcome: Progressing   Problem: Safety: Goal: Ability to remain free from injury will improve Outcome: Not Progressing Note: High fall risk weakness    Problem: Skin Integrity: Goal: Risk for impaired skin integrity will decrease Outcome: Progressing   Problem: Education: Goal: Knowledge of disease and its progression will improve Outcome: Not Progressing Goal: Individualized Educational Video(s) Outcome: Progressing Note: dementia   Problem: Fluid Volume: Goal: Compliance with measures to maintain balanced fluid volume will improve Outcome: Not Progressing Note: ESRD HD  patient   Problem: Health Behavior/Discharge Planning: Goal: Ability to manage health-related needs will improve Outcome: Progressing   Problem: Nutritional: Goal: Ability to make healthy dietary choices will improve Outcome: Progressing   Problem: Clinical Measurements: Goal: Complications related to the disease process, condition or treatment will be avoided or minimized Outcome: Progressing

## 2023-01-29 NOTE — Progress Notes (Addendum)
1945 patient alert to self only follows commands able to make needs known husband at bedside helped patient ambulate to bathroom 2230 patient trying to get out of bed very confused yelling and trying to hit staff unaware of where she is, RN help ambulate patient to bathroom

## 2023-01-29 NOTE — Telephone Encounter (Signed)
Steven/[name], I placed an order for Octreotide LAR 20 mg every 4 weeks. She was approved for free medication in the past, so the husband should be able to bring the medication to the clinic if needed. We were also trying to arrange this injection with their , so I do not know if they will still pursue this or with our infusion clinic.  Hi Mitzie, can you please schedule a follow up appointment for this patient in 3-4 weeks  with me or any of the APPs?  Thanks,  [name], MD Gastroenterology and Hepatology [facility]

## 2023-01-29 NOTE — Progress Notes (Signed)
Received patient in bed to unit.  Alert and oriented.  Informed consent signed and in chart.   TX duration:3.5 hours  Patient tolerated well.  Transported back to the room  Alert, without acute distress.  Hand-off given to patient's nurse.   Access used: catheter Access issues: none  Total UF removed: 1700 ml Medication(s) given: none Post HD VS: 165/99 Post HD weight: 50.2 kg    01/29/23 0615  Vitals  Temp 98.4 F (36.9 C)  Temp Source Oral  BP (!) 165/99  MAP (mmHg) 115  BP Location Right Arm  BP Method Automatic  Patient Position (if appropriate) Lying  Pulse Rate 60  Pulse Rate Source Monitor  ECG Heart Rate 68  Resp 13  Oxygen Therapy  SpO2 97 %  O2 Device Room Air  Patient Activity (if Appropriate) In bed  Pulse Oximetry Type Continuous  Post Treatment  Dialyzer Clearance Lightly streaked  Duration of HD Treatment -hour(s) 3.5 hour(s)  Hemodialysis Intake (mL) 200 mL  Liters Processed 75.3  Fluid Removed (mL) 1700 mL  Tolerated HD Treatment Yes  Post-Hemodialysis Comments HD tx completed as expected, tolerated well, no acute distress, no complaints.  AVG/AVF Arterial Site Held (minutes) 0 minutes  AVG/AVF Venous Site Held (minutes) 0 minutes  Note  Patient Observations pt is in bed resting  Hemodialysis Catheter Left Subclavian Double lumen Temporary (Non-Tunneled)  Placement Date: 06/09/22   Placed prior to admission: Yes  Orientation: Left  Access Location: Subclavian  Hemodialysis Catheter Type: Double lumen Temporary (Non-Tunneled)  Site Condition No complications  Blue Lumen Status Flushed;Heparin locked;Dead end cap in place  Red Lumen Status Flushed;Heparin locked;Dead end cap in place  Purple Lumen Status Flushed;Heparin locked  Catheter fill solution Heparin 1000 units/ml  Catheter fill volume (Arterial) 1.9 cc  Catheter fill volume (Venous) 1.9  Dressing Type Transparent  Dressing Status Antimicrobial disc in place  Drainage Description  None  Post treatment catheter status Capped and Clamped

## 2023-01-29 NOTE — TOC Progression Note (Addendum)
Transition of Care [facility]) - Progression Note    Patient Details  Name: Shawna [name] MRN: [id number] Date of Birth: 10/12/1940  [facility] CM/SW Contact  Leitha Bleak, RN Phone Number: 01/29/2023, 11:09 AM  Clinical Narrative:   Discharge planning for patient to go home. GI has ordered  Octreotide LAR 20 mg every 4 weeks. Patient was approved for free medication. TOC following with day clinic to see if a time can be arranged to give patient medication. CM is in a secure chat with pharmacy and  Marin Shutter, who administrates the clinic for further instructions on Monday.    Social Determinants of Health (SDOH) Interventions SDOH Screenings   Food Insecurity: No Food Insecurity (01/27/2023)  Housing: Low Risk  (01/27/2023)  Transportation Needs: No Transportation Needs (01/27/2023)  Utilities: Not At Risk (01/27/2023)  Financial Resource Strain: Low Risk  (12/14/2018)  Physical Activity: Unknown (12/14/2018)  Social Connections: Unknown (12/14/2018)  Stress: No Stress Concern Present (12/14/2018)  Tobacco Use: Low Risk  (01/27/2023)    Readmission Risk Interventions    01/29/2023   11:08 AM 10/31/2022   12:45 PM 08/29/2022    4:47 PM  Readmission Risk Prevention Plan  Transportation Screening Complete Complete Complete  Medication Review Oceanographer) Complete Complete Complete  PCP or Specialist appointment within 3-5 days of discharge Complete  Complete  HRI or Home Care Consult Complete Complete Complete  SW Recovery Care/Counseling Consult Complete Complete Complete  Palliative Care Screening Not Applicable Not Applicable Not Applicable  Skilled Nursing Facility Not Applicable Not Applicable Complete

## 2023-01-29 NOTE — Progress Notes (Addendum)
Shawna [name], M.D. Gastroenterology & Hepatology   Interval History:  Patient reports feeling well. She reports that she was told she was going to go home today.  Does not not remember exactly why she is in the hospital. Per nursing staff, no melena, hematochezia.  No labs are available today as she refused to have labs drawn.  Repeat hemoglobin yesterday was 12.5 after receiving 2 units of PRBC.  Inpatient Medications:  Current Facility-Administered Medications:    0.9 %  sodium chloride infusion (Manually program via Guardrails IV Fluids), , Intravenous, Once, Kommor, Madison, MD   acetaminophen (TYLENOL) tablet 650 mg, 650 mg, Oral, Q6H PRN, Johnson, Clanford L, MD, 650 mg at 01/28/23 0840   ALPRAZolam (XANAX) tablet 0.5 mg, 0.5 mg, Oral, Daily PRN, Elgergawy, Leana Roe, MD   amiodarone (PACERONE) tablet 200 mg, 200 mg, Oral, Daily, Elgergawy, Leana Roe, MD   benzonatate (TESSALON) capsule 100 mg, 100 mg, Oral, TID PRN, Elgergawy, Leana Roe, MD   [START ON 01/30/2023] calcitRIOL (ROCALTROL) capsule 1.75 mcg, 1.75 mcg, Oral, Q M,W,F-HD, Elgergawy, Leana Roe, MD   Chlorhexidine Gluconate Cloth 2 % PADS 6 each, 6 each, Topical, Q0600, Maxie Barb, MD, 6 each at 01/29/23 [phone]   Chlorhexidine Gluconate Cloth 2 % PADS 6 each, 6 each, Topical, Q0600, Maxie Barb, MD   Melene Muller ON 01/30/2023] cinacalcet (SENSIPAR) tablet 30 mg, 30 mg, Oral, Q M,W,F-HD, Elgergawy, Leana Roe, MD   cyclobenzaprine (FLEXERIL) tablet 5 mg, 5 mg, Oral, TID PRN, Johnson, Clanford L, MD   famotidine (PEPCID) tablet 20 mg, 20 mg, Oral, Daily, Johnson, Clanford L, MD, 20 mg at 01/28/23 1345   folic acid (FOLVITE) tablet 1 mg, 1 mg, Oral, Daily, Elgergawy, Leana Roe, MD, 1 mg at 01/28/23 1345   gabapentin (NEURONTIN) capsule 100 mg, 100 mg, Oral, QPM, Elgergawy, Leana Roe, MD, 100 mg at 01/28/23 1747   hydrALAZINE (APRESOLINE) injection 5 mg, 5 mg, Intravenous, Q4H PRN, Elgergawy, Leana Roe, MD, 5 mg at 01/28/23  0347   hydrALAZINE (APRESOLINE) tablet 25 mg, 25 mg, Oral, BID, Elgergawy, Leana Roe, MD, 25 mg at 01/27/23 2153   hydrOXYzine (ATARAX) tablet 25 mg, 25 mg, Oral, Q6H PRN, Elgergawy, Leana Roe, MD   irbesartan (AVAPRO) tablet 150 mg, 150 mg, Oral, Daily, Johnson, Clanford L, MD   levETIRAcetam (KEPPRA) tablet 500 mg, 500 mg, Oral, Daily, Elgergawy, Leana Roe, MD, 500 mg at 01/28/23 1345   levothyroxine (SYNTHROID) tablet 75 mcg, 75 mcg, Oral, Q0600, Elgergawy, Leana Roe, MD, 75 mcg at 01/29/23 0641   metoprolol tartrate (LOPRESSOR) tablet 25 mg, 25 mg, Oral, BID, Elgergawy, Leana Roe, MD   multivitamin (RENA-VIT) tablet 1 tablet, 1 tablet, Oral, Daily, Elgergawy, Leana Roe, MD, 1 tablet at 01/28/23 1345   nitroGLYCERIN (NITROSTAT) SL tablet 0.4 mg, 0.4 mg, Sublingual, Q5 min PRN, Elgergawy, Leana Roe, MD   octreotide (SANDOSTATIN) injection 100 mcg, 100 mcg, Subcutaneous, TID, Elgergawy, Leana Roe, MD, 100 mcg at 01/28/23 1747   [START ON 01/30/2023] omeprazole (PRILOSEC) capsule 20 mg, 20 mg, Oral, BID AC, Johnson, Clanford L, MD   Oral care mouth rinse, 15 mL, Mouth Rinse, PRN, Johnson, Clanford L, MD   rosuvastatin (CRESTOR) tablet 10 mg, 10 mg, Oral, Daily, Elgergawy, Leana Roe, MD, 10 mg at 01/28/23 1345   I/O    Intake/Output Summary (Last 24 hours) at 01/29/2023 0942 Last data filed at 01/29/2023 0615 Gross per 24 hour  Intake 480 ml  Output 1700 ml  Net -1220 ml  Physical Exam: Temp:  [97.7 F (36.5 C)-98.4 F (36.9 C)] 98.4 F (36.9 C) (07/21 0615) Pulse Rate:  [43-60] 60 (07/21 0615) Resp:  [11-21] 13 (07/21 0615) BP: (98-179)/(46-99) 165/99 (07/21 0615) SpO2:  [97 %-100 %] 97 % (07/21 0615) Weight:  [50.2 kg-51.9 kg] 50.2 kg (07/21 0824)  Temp (24hrs), Avg:98.2 F (36.8 C), Min:97.7 F (36.5 C), Max:98.4 F (36.9 C) GENERAL: The patient is Awake bu Aox1-2, in no acute distress. HEENT: Head is normocephalic and atraumatic. EOMI are intact. Mouth is well hydrated and without  lesions. NECK: Supple. No masses CHEST: Has HD catheter LUNGS: Clear to auscultation. No presence of rhonchi/wheezing/rales. Adequate chest expansion HEART: RRR, normal s1 and s2. ABDOMEN: Soft, nontender, no guarding, no peritoneal signs, and nondistended. BS +. No masses. EXTREMITIES: Without any cyanosis, clubbing, rash, lesions or edema. NEUROLOGIC: Aox1-2, no focal motor deficit. SKIN: no jaundice, no rashes  Laboratory Data: CBC:     Component Value Date/Time   WBC 7.2 01/28/2023 1651   RBC 3.81 (L) 01/28/2023 1651   HGB 12.5 01/28/2023 1651   HCT 38.1 01/28/2023 1651   PLT 252 01/28/2023 1651   MCV 100.0 01/28/2023 1651   MCH 32.8 01/28/2023 1651   MCHC 32.8 01/28/2023 1651   RDW 24.8 (H) 01/28/2023 1651   LYMPHSABS 0.3 (L) 01/27/2023 1640   MONOABS 0.3 01/27/2023 1640   EOSABS 0.0 01/27/2023 1640   BASOSABS 0.1 01/27/2023 1640   COAG:  Lab Results  Component Value Date   INR 1.3 (H) 11/08/2021   INR 1.0 02/03/2021   INR 1.1 06/12/2020    BMP:     Latest Ref Rng & Units 01/28/2023    4:51 PM 01/27/2023    4:40 PM 01/18/2023    1:57 AM  BMP  Glucose 70 - 99 mg/dL 725  366  84   BUN 8 - 23 mg/dL 29  24  13    Creatinine 0.44 - 1.00 mg/dL 4.40  3.47  4.25   Sodium 135 - 145 mmol/L 130  130  131   Potassium 3.5 - 5.1 mmol/L 3.9  3.7  3.2   Chloride 98 - 111 mmol/L 92  93  93   CO2 22 - 32 mmol/L 25  25  26    Calcium 8.9 - 10.3 mg/dL 8.3  8.2  8.6     HEPATIC:     Latest Ref Rng & Units 01/27/2023    4:40 PM 01/18/2023    1:57 AM 12/23/2022   11:24 AM  Hepatic Function  Total Protein 6.5 - 8.1 g/dL 6.8  6.6    Albumin 3.5 - 5.0 g/dL 2.9  2.8  2.3   AST 15 - 41 U/L 8  13    ALT 0 - 44 U/L 10  12    Alk Phosphatase 38 - 126 U/L 83  68    Total Bilirubin 0.3 - 1.2 mg/dL 1.0  0.9      CARDIAC:  Lab Results  Component Value Date   CKTOTAL 47 12/03/2020   CKMB 1.5 05/19/2016   TROPONINI 0.37 (HH) 12/26/2018      Imaging: I personally reviewed and  interpreted the available labs, imaging and endoscopic files.   Assessment/Plan: 82 y.o. female history of dementia, ESRD on hemodialysis, A-fib on aspirin, CAD, CHF, recurrent gastric and small bowel AVMs, GERD, HTN, colon cancer s/p resection in 1992, duodenal adenoma, who was admitted to the hospital after being sent from the dialysis center due to severe  anemia.  Patient has not presented any overt gastrointestinal bleeding and was incidentally found to have recurrent anemia.  Notably, her most recent iron stores were within normal limits.  Anemia is likely multifactorial at the moment and is not related to large GI bleeding.  I agree that a component of this anemia may be driven by her history of AVMs, which have been difficult to treat as they have recurred multiple times despite ablation.  It is very important to be able to restart octreotide monthly injections with the hope that further chronic losses can be prevented. For now, will recommend rechecking her H&H on a daily basis and continuing octreotide 100 mcg 3 times daily.  As she has not presented any clinical bleeding, we will hold off on proceeding with any endoscopic procedures.  After discussion with the inpatient team, will try to arrange for the IM injections to be administered in the infusion clinic at [facility].  Will reach the infusion team in the morning.   Will need to make sure that she receives the monthly dose of octreotide as outpatient.   Consider palliative care consultation given her multiple comorbidities and advanced dementia.   - Repeat CBC qday, transfuse if Hb <7 - Pantoprazole 40 mg q12h IVP -Octreotide 100 mcg 3 times daily - Will arrange IM octreotide LAR 20 mg every 4 weeks - 2 large bore IV lines - Active T/S -Heart healthy diet - Avoid NSAIDs -Consider palliative care consultation - Patient will follow up in GI clinic in 3-4 weeks. - GI service will sign-off, please call us back if you have any  more questions.   Shawna Blazing, MD Gastroenterology and Hepatology [facility] Gastroenterology

## 2023-01-29 NOTE — Plan of Care (Signed)

## 2023-01-30 ENCOUNTER — Other Ambulatory Visit: Payer: Self-pay

## 2023-01-30 ENCOUNTER — Encounter (HOSPITAL_COMMUNITY): Payer: Self-pay | Admitting: Internal Medicine

## 2023-01-30 DIAGNOSIS — Z7189 Other specified counseling: Secondary | ICD-10-CM | POA: Diagnosis not present

## 2023-01-30 DIAGNOSIS — I5042 Chronic combined systolic (congestive) and diastolic (congestive) heart failure: Secondary | ICD-10-CM | POA: Diagnosis not present

## 2023-01-30 DIAGNOSIS — K5521 Angiodysplasia of colon with hemorrhage: Secondary | ICD-10-CM | POA: Diagnosis not present

## 2023-01-30 DIAGNOSIS — E039 Hypothyroidism, unspecified: Secondary | ICD-10-CM | POA: Diagnosis not present

## 2023-01-30 DIAGNOSIS — D62 Acute posthemorrhagic anemia: Secondary | ICD-10-CM | POA: Diagnosis not present

## 2023-01-30 DIAGNOSIS — Z515 Encounter for palliative care: Secondary | ICD-10-CM

## 2023-01-30 LAB — TYPE AND SCREEN
ABO/RH(D): O POS
Antibody Screen: NEGATIVE
Donor AG Type: NEGATIVE
Donor AG Type: NEGATIVE
Unit division: 0

## 2023-01-30 LAB — BPAM RBC
Blood Product Expiration Date: 202408242359
Blood Product Expiration Date: 202408242359
ISSUE DATE / TIME: 202407192240
ISSUE DATE / TIME: 202407200201

## 2023-01-30 MED ORDER — AMIODARONE HCL 200 MG PO TABS
100.0000 mg | ORAL_TABLET | Freq: Every day | ORAL | Status: DC
  Administered 2023-01-30 – 2023-02-01 (×3): 100 mg via ORAL
  Filled 2023-01-30 (×3): qty 1

## 2023-01-30 MED ORDER — HEPARIN SODIUM (PORCINE) 1000 UNIT/ML IJ SOLN
INTRAMUSCULAR | Status: AC
  Filled 2023-01-30: qty 4

## 2023-01-30 MED ORDER — METOPROLOL TARTRATE 25 MG PO TABS
12.5000 mg | ORAL_TABLET | Freq: Two times a day (BID) | ORAL | Status: DC
  Administered 2023-01-30 – 2023-01-31 (×4): 12.5 mg via ORAL
  Filled 2023-01-30 (×5): qty 1

## 2023-01-30 MED ORDER — HEPARIN SODIUM (PORCINE) 1000 UNIT/ML IJ SOLN
3800.0000 [IU] | Freq: Once | INTRAMUSCULAR | Status: AC
  Administered 2023-01-30: 3800 [IU]

## 2023-01-30 NOTE — Consult Note (Signed)
Consultation Note Date: 01/30/2023   Patient Name: Shawna [name]  DOB: 10-04-1940  MRN: [id number]  Age / Sex: 82 y.o., female  PCP: [facility] Referring Physician: Cleora Fleet, MD  Reason for Consultation: Establishing goals of care  HPI/Patient Profile: 82 y.o. female  with past medical history of hypertension, hyperlipidemia, CAD status post CABG and PCI, paroxysmal A-fib, no longer on anticoagulation due to recurrent GI bleed, ESRD on hemodialysis (MWF), seizure disorder, hypothyroidism admitted on 01/27/2023 with acute on chronic blood loss anemia secondary to upper GI bleed with known AVMs.   Clinical Assessment and Goals of Care: I have reviewed medical records including EPIC notes, labs and imaging, received report from RN, assessed the patient.  Shawna [name] is resting quietly in bed.  She appears acutely/chronically ill and very frail.  She is resting comfortably, but wakes easily when I enter.  I ask her if she would speak to me today and she shakes her head no.  She is able to take a sip of liquids without overt signs and symptoms of aspiration and hold/eat a banana that I placed in her hand.  I believe that she can make her basic needs known.  There is no family at bedside at this time.  Call to spouse, Shawna [name], to discuss diagnosis prognosis, GOC, EOL wishes, disposition and options. I introduced Palliative Medicine as specialized medical care for people living with serious illness. It focuses on providing relief from the symptoms and stress of a serious illness. The goal is to improve quality of life for both the patient and the family.  We  focused on their current illness.  We talk about her dialysis treatment on the 21st.  We talk about octreotide injections, overall he seems knowledgeable about this.  We talk about her frailty and decreased functional status even though  she has been to short-term rehab.  The natural disease trajectory and expectations at EOL were discussed.  Advanced directives, concepts specific to code status, artifical feeding and hydration, and rehospitalization were considered and discussed.  Shawna [name] states that he and his wife have discussed CODE STATUS.  She would want attempted resuscitation, anything that would help keep her alive.  We talk about setting limits, how long would she want to be on life support.  He states that that is not something they have considered.  I encouraged him to discuss this with Shawna [name] and palliative services.  Palliative Care services outpatient were explained and offered.  Shawna [name] was to be active with [facility] for outpatient palliative services after discharge June.  Team updated.  Discussed the importance of continued conversation with family and the medical providers regarding overall plan of care and treatment options, ensuring decisions are within the context of the patient's values and GOCs.  Questions and concerns were addressed.  The family was encouraged to call with questions or concerns.  PMT will continue to support holistically.     Shawna  NEXT OF KIN spouse of 64 years Gardiner [name]  Shawna [name].      SUMMARY OF RECOMMENDATIONS   Continue full scope/full code Home with home health Outpatient palliative services with [facility] Monthly octreotide administered via PCP   Code Status/Advance Care Planning: Full code  Symptom Management:  Per hospitalist, no additional needs at this time.  Palliative Prophylaxis:  Frequent Pain Assessment  Additional Recommendations (Limitations, Scope, Preferences): Full Scope Treatment  Psycho-social/Spiritual:  Desire for further Chaplaincy support:no Additional Recommendations: Caregiving  Support/Resources  Prognosis:  Unable to determine, based on outcomes.  6 months or less would not be surprising based on 6 hospital stays and 6 ED visits in the last 6  months, chronic illness burden.  Discharge Planning: Home with Home Health      Primary Diagnoses: Present on Admission:  Acute blood loss anemia  Acquired hypothyroidism  Angiodysplasia of duodenum  Atrial fibrillation, chronic (HCC)  AVM (arteriovenous malformation) of small bowel, acquired with hemorrhage  Benign neoplasm of descending colon  Chronic combined systolic and diastolic CHF (congestive heart failure) (HCC)   I have reviewed the medical record, interviewed the patient and family, and examined the patient. The following aspects are pertinent.  Past Medical History:  Diagnosis Date   Acute on chronic respiratory failure with hypoxia (HCC) 10/10/2016   Anxiety    Arthritis    AVM (arteriovenous malformation) of colon    CAD (coronary artery disease)    a. s/p CABG in 2013 b. DES to D1 in 10/2016. c. cath in 07/2018 showing patent grafts with occlusion of D1 at prior stent site and progression of PDA disease --> medical management recommended   Carotid artery disease (HCC)    a. 60-79% LICA, 03/2012    Chronic bronchitis (HCC)    Chronic HFrEF (heart failure with reduced ejection fraction) (HCC)    Colon cancer (HCC) 1992   Esophageal stricture    ESRD on hemodialysis (HCC)    ESRD due to HTN, started dialysis 2011 and gets HD at [facility] with Dr Fausto Skillern on MWF schedule.  Access is LUA AVF as of Sept 2014.    GERD (gastroesophageal reflux disease)    High cholesterol 12/2011   History of blood transfusion 07/2011; 12/2011; 01/2012 X 2; 04/2012   History of gout    History of lower GI bleeding    Hypertension    Iron deficiency anemia    Jugular vein occlusion, right (HCC)    Mitral regurgitation    a. Moderate by echo, 02/2012   Mitral valve disease    NSVT (nonsustained ventricular tachycardia) (HCC)    Ovarian cancer (HCC) 1992   PAF (paroxysmal atrial fibrillation) (HCC)    Pneumonia ~ 2009   PUD (peptic ulcer disease)    TIA (transient ischemic attack)     Tricuspid valve disease    Social History   Socioeconomic History   Marital status: Married    Spouse name: Shawna [name]   Number of children: 1   Years of education: Not on file   Highest education level: GED or equivalent  Occupational History   Occupation: retired- Architectural technologist- sewed  Tobacco Use   Smoking status: Never   Smokeless tobacco: Never  Vaping Use   Vaping status: Never Used  Substance and Sexual Activity   Alcohol use: No    Alcohol/week: 0.0 standard drinks of alcohol   Drug use: No   Sexual activity: Not Currently    Birth control/protection: Surgical  Other Topics Concern   Not on file  Social  History Narrative   Lives in [place], [place] with husband.  Dialysis pt - mwf.   Social Determinants of Health   Financial Resource Strain: Low Risk  (12/14/2018)   Overall Financial Resource Strain (CARDIA)    Difficulty of Paying Living Expenses: Not very hard  Food Insecurity: No Food Insecurity (01/27/2023)   Hunger Vital Sign    Worried About Running Out of Food in the Last Year: Never true    Ran Out of Food in the Last Year: Never true  Transportation Needs: No Transportation Needs (01/27/2023)   PRAPARE - Administrator, Civil Service (Medical): No    Lack of Transportation (Non-Medical): No  Physical Activity: Unknown (12/14/2018)   Exercise Vital Sign    Days of Exercise per Week: Patient declined    Minutes of Exercise per Session: Patient declined  Stress: No Stress Concern Present (12/14/2018)   [employer] of Occupational Health - Occupational Stress Questionnaire    Feeling of Stress : Only a little  Social Connections: Unknown (12/14/2018)   Social Connection and Isolation Panel [NHANES]    Frequency of Communication with Friends and Family: Patient declined    Frequency of Social Gatherings with Friends and Family: Patient declined    Attends Religious Services: Patient declined    Database administrator or  Organizations: Patient declined    Attends Engineer, structural: Patient declined    Marital Status: Patient declined   Family History  Problem Relation Age of Onset   Heart disease Mother        Heart Disease before age 61   Hyperlipidemia Mother    Hypertension Mother    Diabetes Mother    Heart attack Mother    Heart disease Father        Heart Disease before age 35   Hyperlipidemia Father    Hypertension Father    Diabetes Father    Diabetes Sister    Hypertension Sister    Diabetes Brother    Hyperlipidemia Brother    Heart attack Brother    Hypertension Sister    Heart attack Brother    Colon cancer Child 3   Other Other        noncontributory for early CAD   Esophageal cancer Neg Hx    Liver disease Neg Hx    Kidney disease Neg Hx    Colon polyps Neg Hx    Scheduled Meds:  sodium chloride   Intravenous Once   amiodarone  100 mg Oral Daily   calcitRIOL  1.75 mcg Oral Q M,W,F-HD   Chlorhexidine Gluconate Cloth  6 each Topical Q0600   Chlorhexidine Gluconate Cloth  6 each Topical Q0600   cinacalcet  30 mg Oral Q M,W,F-HD   folic acid  1 mg Oral Daily   gabapentin  100 mg Oral QPM   hydrALAZINE  25 mg Oral BID   irbesartan  150 mg Oral Daily   levETIRAcetam  500 mg Oral Daily   levothyroxine  75 mcg Oral Q0600   metoprolol tartrate  12.5 mg Oral BID   multivitamin  1 tablet Oral Daily   octreotide  100 mcg Subcutaneous TID   omeprazole  20 mg Oral BID AC   rosuvastatin  10 mg Oral Daily   Continuous Infusions: PRN Meds:.acetaminophen, ALPRAZolam, benzonatate, hydrALAZINE, hydrOXYzine, nitroGLYCERIN, mouth rinse Medications Prior to Admission:  Prior to Admission medications   Medication Sig Start Date End Date Taking? Authorizing Provider  amiodarone (PACERONE) 200  MG tablet Take 200 mg by mouth daily. 11/21/22  Yes [provider]  benzonatate (TESSALON) 100 MG capsule Take 1 capsule (100 mg total) by mouth 3 (three) times daily as needed  for cough. 01/18/23  Yes Tilden Fossa, MD  calcitRIOL (ROCALTROL) 0.25 MCG capsule Take 7 capsules (1.75 mcg total) by mouth every Monday, Wednesday, and Friday with hemodialysis. 12/21/22  Yes Lanae Boast, MD  cyclobenzaprine (FLEXERIL) 5 MG tablet Take 5 mg by mouth 3 (three) times daily as needed.   Yes [provider]  folic acid (FOLVITE) 1 MG tablet Take 1 mg by mouth daily. 05/24/22  Yes [provider]  gabapentin (NEURONTIN) 100 MG capsule Take 1 capsule (100 mg total) by mouth every evening. 12/13/22  Yes Jerald Kief, MD  guaiFENesin-codeine 100-10 MG/5ML syrup Take 5 mLs by mouth every 4 (four) hours as needed for cough. 10/23/22  Yes Jacalyn Lefevre, MD  hydrALAZINE (APRESOLINE) 25 MG tablet Take 1 tablet (25 mg total) by mouth 2 (two) times daily. Hold for sbp <110 12/27/22  Yes Kc, Dayna Barker, MD  hydrOXYzine (ATARAX) 25 MG tablet Take 25 mg by mouth every 6 (six) hours as needed for itching. 06/06/22  Yes [provider]  levETIRAcetam (KEPPRA) 500 MG tablet Take 1 tablet (500 mg total) by mouth daily. 11/15/22  Yes Leroy Sea, MD  levothyroxine (SYNTHROID) 75 MCG tablet Take 1 tablet (75 mcg total) by mouth daily before breakfast. 08/14/22 01/28/23 Yes Pahwani, Daleen Bo, MD  metoprolol tartrate (LOPRESSOR) 25 MG tablet Take 1 tablet (25 mg total) by mouth 2 (two) times daily. Hold for sbp <110 and hr < 60/min 12/27/22  Yes Kc, Dayna Barker, MD  multivitamin (RENA-VIT) TABS tablet Take 1 tablet by mouth daily.   Yes [provider]  nitroGLYCERIN (NITROSTAT) 0.4 MG SL tablet Place 0.4 mg under the tongue every 5 (five) minutes as needed for chest pain.   Yes [provider]  octreotide (SANDOSTATIN LAR) 20 MG injection Inject 20 mg into the muscle every 28 (twenty-eight) days. 11/15/22  Yes Leroy Sea, MD  olmesartan (BENICAR) 20 MG tablet Take 20 mg by mouth daily. 01/14/23  Yes [provider]  ondansetron (ZOFRAN-ODT) 4 MG disintegrating  tablet Take 4 mg by mouth every 8 (eight) hours as needed for nausea or vomiting. 05/05/22  Yes [provider]  rosuvastatin (CRESTOR) 10 MG tablet Take 1 tablet by mouth once daily 06/06/22  Yes Strader, [place] M, PA-C  ALPRAZolam Prudy Feeler) 0.5 MG tablet Take 1 tablet (0.5 mg total) by mouth daily as needed for up to 4 doses for anxiety. Patient not taking: Reported on 01/28/2023 12/19/22   Lanae Boast, MD  cinacalcet (SENSIPAR) 30 MG tablet Take 1 tablet (30 mg total) by mouth every Monday, Wednesday, and Friday with hemodialysis. Patient not taking: Reported on 01/28/2023 12/19/22   Lanae Boast, MD   Allergies  Allergen Reactions   Amlodipine Swelling   Aspirin Other (See Comments)    High Doses Mess up her stomach; "makes my bowels have blood in them". Takes 81 mg EC Aspirin    Nitrofurantoin Hives   Ranexa [Ranolazine] Other (See Comments)    Myoclonus-hospitalized    Bactrim [Sulfamethoxazole-Trimethoprim] Rash   Contrast Media [Iodinated Contrast Media] Itching   Iron Itching and Other (See Comments)    "they gave me iron in dialysis; had to give me Benadryl cause I had to have the iron" (05/02/2012)   Gabapentin Other (See Comments)  Unknown reaction   Sucroferric Oxyhydroxide Other (See Comments)    Unknown   Levaquin [Levofloxacin In D5w] Rash   Plavix [Clopidogrel Bisulfate] Rash   Protonix [Pantoprazole Sodium] Rash   Venofer [Ferric Oxide] Itching and Other (See Comments)    Patient reports using Benadryl prior to doses as [facility]   Review of Systems  Unable to perform ROS: Age    Physical Exam Vitals and nursing note reviewed.     Vital Signs: BP (!) 171/66 (BP Location: Right Arm)   Pulse 75   Temp 97.9 F (36.6 C) (Axillary)   Resp 15   Ht 5\' 2"  (1.575 m)   Wt 50.2 kg   SpO2 98%   BMI 20.24 kg/m  Pain Scale: 0-10   Pain Score: 0-No pain   SpO2: SpO2: 98 % O2 Device:SpO2: 98 % O2 Flow Rate: .   IO: Intake/output summary:   Intake/Output Summary (Last 24 hours) at 01/30/2023 1328 Last data filed at 01/30/2023 0900 Gross per 24 hour  Intake 130 ml  Output 1 ml  Net 129 ml    LBM: Last BM Date : 01/29/23 Baseline Weight: Weight: 51.7 kg Most recent weight: Weight: 50.2 kg     Palliative Assessment/Data:     Time In: 1105  Time Out: 1220 Time Total: 75 minutes  Greater than 50%  of this time was spent counseling and coordinating care related to the above assessment and plan.  Signed by: Katheran Awe, NP   Please contact Palliative Medicine Team phone at [phone] for questions and concerns.  For individual provider: See Loretha Stapler

## 2023-01-30 NOTE — Care Management Important Message (Signed)
Important Message  Patient Details Mailed IM Letter to Spouse. Name: IMONIE TUCH MRN: [id number] Date of Birth: [birth year]   Medicare Important Message Given:  Yes - Important Message mailed due to current National Emergency     Caren Macadam 01/30/2023, 9:23 AM

## 2023-01-30 NOTE — Plan of Care (Signed)
progressing 

## 2023-01-30 NOTE — TOC Progression Note (Signed)
Transition of Care [facility]) - Progression Note    Patient Details  Name: Shawna [name] MRN: [id number] Date of Birth: 06-23-1941  [facility]) CM/SW Contact  Elliot Gault, LCSW Phone Number: 01/30/2023, 12:28 PM  Clinical Narrative:      TOC following. Per MD/RN, pt/family now agreeable for injection to be given in PCP office every four weeks. MD anticipating dc tomorrow.      Expected Discharge Plan and Services                                               Social Determinants of Health (SDOH) Interventions SDOH Screenings   Food Insecurity: No Food Insecurity (01/27/2023)  Housing: Low Risk  (01/27/2023)  Transportation Needs: No Transportation Needs (01/27/2023)  Utilities: Not At Risk (01/27/2023)  Financial Resource Strain: Low Risk  (12/14/2018)  Physical Activity: Unknown (12/14/2018)  Social Connections: Unknown (12/14/2018)  Stress: No Stress Concern Present (12/14/2018)  Tobacco Use: Low Risk  (01/27/2023)    Readmission Risk Interventions    01/29/2023   11:08 AM 10/31/2022   12:45 PM 08/29/2022    4:47 PM  Readmission Risk Prevention Plan  Transportation Screening Complete Complete Complete  Medication Review Oceanographer) Complete Complete Complete  PCP or Specialist appointment within 3-5 days of discharge Complete  Complete  HRI or Home Care Consult Complete Complete Complete  SW Recovery Care/Counseling Consult Complete Complete Complete  Palliative Care Screening Not Applicable Not Applicable Not Applicable  Skilled Nursing Facility Not Applicable Not Applicable Complete

## 2023-01-30 NOTE — Telephone Encounter (Signed)
Thanks for the update, I communicated this to the inpatient team

## 2023-01-30 NOTE — Progress Notes (Signed)
Admit: 01/27/2023 LOS: 3  66F ESRD MWF [facility] with recurrent GIB and ABLA, Hb 6.4 at presentation  Subjective:  HD yesterday with 1.7L UF GI following, on octreotide and PPI For HD today SHe is confused   07/21 0701 - 07/22 0700 In: 250 [P.O.:240] Out: 1 [Stool:1]  Filed Weights   01/27/23 1322 01/29/23 0200 01/29/23 0824  Weight: 51.7 kg 51.9 kg 50.2 kg    Scheduled Meds:  sodium chloride   Intravenous Once   amiodarone  100 mg Oral Daily   calcitRIOL  1.75 mcg Oral Q M,W,F-HD   Chlorhexidine Gluconate Cloth  6 each Topical Q0600   Chlorhexidine Gluconate Cloth  6 each Topical Q0600   cinacalcet  30 mg Oral Q M,W,F-HD   folic acid  1 mg Oral Daily   gabapentin  100 mg Oral QPM   hydrALAZINE  25 mg Oral BID   irbesartan  150 mg Oral Daily   levETIRAcetam  500 mg Oral Daily   levothyroxine  75 mcg Oral Q0600   metoprolol tartrate  12.5 mg Oral BID   multivitamin  1 tablet Oral Daily   octreotide  100 mcg Subcutaneous TID   omeprazole  20 mg Oral BID AC   rosuvastatin  10 mg Oral Daily   Continuous Infusions: PRN Meds:.acetaminophen, ALPRAZolam, benzonatate, hydrALAZINE, hydrOXYzine, nitroGLYCERIN, mouth rinse  Current Labs: reviewed   Physical Exam:  Blood pressure (!) 172/62, pulse 73, temperature 97.8 F (36.6 C), temperature source Axillary, resp. rate 15, height 5\' 2"  (1.575 m), weight 50.2 kg, SpO2 100%. Elderly femal NAD RRR no rub CTAB S/nt/nd  A ESRD MWF [facility] using TDC; no heparin Recurrent Anemia and chronic GIB per GI; rec 2u PRBC at presentation HTN: Trend with UF, on hydral, arb, BB BMM / 2HPTH: cont C3; Ca stable, historical P well controlled  P HD today Medication Issues; Preferred narcotic agents for pain control are hydromorphone, fentanyl, and methadone. Morphine should not be used.  Baclofen should be avoided Avoid oral sodium phosphate and magnesium citrate based laxatives / bowel preps    Sabra Heck MD 01/30/2023, 9:02  AM  Recent Labs  Lab 01/27/23 1640 01/28/23 1651  NA 130* 130*  K 3.7 3.9  CL 93* 92*  CO2 25 25  GLUCOSE 150* 151*  BUN 24* 29*  CREATININE 5.50* 6.26*  CALCIUM 8.2* 8.3*   Recent Labs  Lab 01/27/23 1640 01/28/23 1651  WBC 6.6 7.2  NEUTROABS 5.8  --   HGB 6.4* 12.5  HCT 20.6* 38.1  MCV 113.2* 100.0  PLT 310 252

## 2023-01-30 NOTE — Progress Notes (Signed)
Mobility Specialist Progress Note:   01/30/23 1358  Mobility  Activity Ambulated with assistance in room;Transferred from bed to chair  Level of Assistance Minimal assist, patient does 75% or more  Assistive Device Front wheel walker  Distance Ambulated (ft) 10 ft  Range of Motion/Exercises Active;All extremities  Activity Response Tolerated well  Mobility Referral Yes  $Mobility charge 1 Mobility  Mobility Specialist Start Time (ACUTE ONLY) 1315  Mobility Specialist Stop Time (ACUTE ONLY) 1325  Mobility Specialist Time Calculation (min) (ACUTE ONLY) 10 min   Pt received lethargic in bed, agreeable to mobility session after encouragement. Ambulated in room B>C with RW. MinA to sit EOB, CGA while ambulating for safety. Tolerated well,asx throughout. Chair alarm on, call bell in reach. Nurse in room, all needs met.   Feliciana Rossetti Mobility Specialist Please contact via Special educational needs teacher or  Rehab office at [phone]

## 2023-01-30 NOTE — Progress Notes (Signed)
Has been drowsy today but able to take medications and feed self banana.  With mobility tech and walker able to ambulate to chair and at that point was much for interactive and talkative.  Lunch tray set up at chair and patient feeding self lunch.  Chair alarm set.  After lunch was transported to dialysis.

## 2023-01-30 NOTE — Progress Notes (Signed)
PROGRESS NOTE   Shawna [name]  BJY:[id number] DOB: 1941-07-03 DOA: 01/27/2023 PCP: [facility]   Chief Complaint  Patient presents with   Abnormal Lab   Level of care: Telemetry  Brief Admission History:  82 y.o. female, with past medical history significant for hypertension, hyperlipidemia, CAD status post CABG and PCI, paroxysmal A-fib, no longer on anticoagulation due to recurrent GI bleed, ESRD on hemodialysis (MWF), seizure disorder, hypothyroidism.  Patient was sent to ED by HD center for low hemoglobin, patient reports generalized weakness, fatigue, intermittent dark stools, patient with known history of AVM in the past, supposed to be on subcutaneous octreotide which has not been started yet, she had labs done at her HD center today given her generalized weakness and fatigue, for which hemoglobin was noted to be 6.1, so she was sent for further workup, she denies nausea, vomiting, abdominal pain, no fever or chills.   In the ER her workup was significant for sodium of 130, hemoglobin of 6.4, platelet of 310, she was ordered 2 units PRBC, GI has been consulted, as well nephrology has been consulted as she has not had her HD that was due day of admission. [facility] hospitalist consulted to admit.   Assessment and Plan:  Acute on chronic blood loss anemia Upper GI bleed secondary to known AVMs -Presented with symptomatic anemia, melena, this is most likely in the setting of known AVM of small bowel -Started on octreotide per GI recommendation - long acting monthly injections -She had small bowel in uroscopy with duodenal diverticulum, with 6 nonbleeding angioplastic lesions in the proximal jejunum which has been treated with argon plasma coagulation in the past, GI has been consulted, will defer further workup and need for repeat enteroscopy to GI -s/p 2 units PRBC -SCD for DVT prophylaxis -tolerating heart healthy diet -Plan is for patient to go to Dr. Cato Mulligan office to  receive the monthly long acting octreotide injections.    ESRD -she received 2 units PRBC on 7/20 -HD planned for 7/22   Atrial tachycardia/paroxysmal atrial fibrillation/flutter/?WCT: -Has been seen by cardiology in the past, continue rhythm control medication with amiodarone and metoprolol -Not on anticoagulation secondary to recurrent GI bleed   Chronic systolic CHF with EF 30-35% CAD status post CABG with residual small vessel disease Chest pain due to demand ischemia: -Volume management with HD -No anticoagulation or antiplatelet therapy secondary to recurrent GI bleed, she denies any chest pain  Uncontrolled Hypertension - suspect her BP elevation is related to volume overload secondary to 2 units PRBC and missing HD yesterday.  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  Mild Cognitive Impairment - Pt has been sundowning - added delirium precautions - plan to get back home soon hopefully tomorrow   DVT prophylaxis: SCDs Code Status: Full  Family Communication: husband  Disposition: anticipate DC home likely tomorrow, has HD planned today with nephrology service   Consultants:  GI Nephrology  Procedures:  Hemodialysis  Antimicrobials:    Subjective: Pt intermittently has been confused.     Objective: Vitals:   01/29/23 1414 01/29/23 2059 01/29/23 2313 01/30/23 0416  BP: (!) 126/50 (!) 167/63 (!) 175/52 (!) 172/62  Pulse: (!) 55 (!) 55  73  Resp: 16 15    Temp:  (!)  97.5 F (36.4 C) 97.6 F (36.4 C) 97.8 F (36.6 C)  TempSrc:  Oral Axillary Axillary  SpO2: 100% 100% 100% 100%  Weight:      Height:        Intake/Output Summary (Last 24 hours) at 01/30/2023 1141 Last data filed at 01/30/2023 0900 Gross per 24 hour  Intake  370 ml  Output 1 ml  Net 369 ml   Filed Weights   01/27/23 1322 01/29/23 0200 01/29/23 0824  Weight: 51.7 kg 51.9 kg 50.2 kg   Examination:  General exam: elderly frail female, awake, alert, cooperative, Appears calm and chronically ill.  Respiratory system:  Respiratory effort normal. Cardiovascular system: normal S1 & S2 heard. Trace pedal edema. Gastrointestinal system: Abdomen is nondistended, soft and nontender. No organomegaly or masses felt. Normal bowel sounds heard. Central nervous system: Alert and oriented x 2. No focal neurological deficits. Extremities: 1+ pretibial edema BLEs. Symmetric 5 x 5 power. Skin: No rashes, lesions or ulcers. Psychiatry: Judgement and insight appear poor. Mood & affect appropriate.   Data Reviewed: I have personally reviewed following labs and imaging studies  CBC: Recent Labs  Lab 01/27/23 1640 01/28/23 1651  WBC 6.6 7.2  NEUTROABS 5.8  --   HGB 6.4* 12.5  HCT 20.6* 38.1  MCV 113.2* 100.0  PLT 310 252    Basic Metabolic Panel: Recent Labs  Lab 01/27/23 1640 01/28/23 1651  NA 130* 130*  K 3.7 3.9  CL 93* 92*  CO2 25 25  GLUCOSE 150* 151*  BUN 24* 29*  CREATININE 5.50* 6.26*  CALCIUM 8.2* 8.3*    CBG: No results for input(s): "GLUCAP" in the last 168 hours.  Recent Results (from the past 240 hour(s))  MRSA Next Gen by PCR, Nasal     Status: None   Collection Time: 01/28/23  6:05 PM   Specimen: Nasal Mucosa; Nasal Swab  Result Value Ref Range Status   MRSA by PCR Next Gen NOT DETECTED NOT DETECTED Final    Comment: (NOTE) The GeneXpert MRSA Assay (FDA approved for NASAL specimens only), is one component of a comprehensive MRSA colonization surveillance program. It is not intended to diagnose MRSA infection nor to guide or monitor treatment for MRSA infections. Test performance is not FDA approved in patients less than 91 years old. Performed at [facility], [address] [place], [place] [address]       Radiology Studies: No results found.  Scheduled Meds:  sodium chloride   Intravenous Once   amiodarone  100 mg Oral Daily   calcitRIOL  1.75 mcg Oral Q M,W,F-HD   Chlorhexidine Gluconate Cloth  6 each Topical Q0600   Chlorhexidine Gluconate Cloth  6 each Topical Q0600   cinacalcet  30 mg Oral Q M,W,F-HD   folic acid  1 mg Oral Daily   gabapentin  100 mg Oral QPM   hydrALAZINE  25 mg Oral BID   irbesartan  150 mg Oral Daily   levETIRAcetam  500 mg Oral Daily   levothyroxine  75 mcg Oral Q0600   metoprolol tartrate  12.5 mg Oral BID   multivitamin  1 tablet Oral Daily   octreotide  100 mcg Subcutaneous TID   omeprazole  20 mg Oral BID AC   rosuvastatin  10 mg Oral Daily   Continuous Infusions:   LOS: 3 days   Time spent: 35 mins  Rockwell Zentz [name], MD How to contact the [facility] Attending or Consulting provider 7A - 7P or covering provider  during after hours 7P -7A, for this patient?  Check the care team in [facility] and look for a) attending/consulting TRH provider listed and b) the [facility] team listed Log into [email or web address] and use 's universal password to access. If you do not have the password, please contact the hospital operator. Locate the [facility] provider you are looking for under [facility] and page to a number that you can be directly reached. If you still have difficulty reaching the provider, please page the [facility] (Director on Call) for the Hospitalists listed on amion for assistance.  01/30/2023, 11:41 AM

## 2023-01-30 NOTE — Telephone Encounter (Signed)
I spoke with the patient husband today whom says he has yet to get the free medication in the mail. I have asked that he please reach out to them to see what the status of the medication shipment is. He says he has already spoke with Dr. Cato Mulligan office and they plan on giving the patient the medication once they get it shipped to the patient home.

## 2023-01-30 NOTE — Progress Notes (Signed)
   01/30/23 1840  Vitals  Temp 97.6 F (36.4 C)  Temp Source Axillary  BP (!) 150/60  BP Location Right Arm  BP Method Automatic  Patient Position (if appropriate) Lying  Pulse Rate (!) 57  During Treatment Monitoring  Intra-Hemodialysis Comments Tx completed  Post Treatment  Dialyzer Clearance Lightly streaked  Duration of HD Treatment -hour(s) 3.5 hour(s)  Hemodialysis Intake (mL) 0 mL  Liters Processed 70  Fluid Removed (mL) 2000 mL  Tolerated HD Treatment Yes  Post-Hemodialysis Comments goal met.  Hemodialysis Catheter Left Subclavian Double lumen Temporary (Non-Tunneled)  Placement Date: 06/09/22   Placed prior to admission: Yes  Orientation: Left  Access Location: Subclavian  Hemodialysis Catheter Type: Double lumen Temporary (Non-Tunneled)  Site Condition No complications  Blue Lumen Status Heparin locked  Red Lumen Status Heparin locked  Purple Lumen Status N/A  Catheter fill solution Heparin 1000 units/ml  Catheter fill volume (Arterial) 1.9 cc  Catheter fill volume (Venous) 1.9  Dressing Type Transparent  Dressing Status Antimicrobial disc in place;Clean, Dry, Intact  Interventions New dressing  Drainage Description None  Dressing Change Due 02/04/23  Post treatment catheter status Capped and Clamped

## 2023-01-31 ENCOUNTER — Inpatient Hospital Stay (HOSPITAL_COMMUNITY): Payer: Medicare Other

## 2023-01-31 DIAGNOSIS — K5521 Angiodysplasia of colon with hemorrhage: Secondary | ICD-10-CM | POA: Diagnosis not present

## 2023-01-31 LAB — RENAL FUNCTION PANEL
Albumin: 2.7 g/dL — ABNORMAL LOW (ref 3.5–5.0)
Anion gap: 12 (ref 5–15)
BUN: 16 mg/dL (ref 8–23)
CO2: 26 mmol/L (ref 22–32)
Calcium: 8.6 mg/dL — ABNORMAL LOW (ref 8.9–10.3)
Chloride: 96 mmol/L — ABNORMAL LOW (ref 98–111)
Creatinine, Ser: 3.39 mg/dL — ABNORMAL HIGH (ref 0.44–1.00)
GFR, Estimated: 13 mL/min — ABNORMAL LOW (ref >60)
Glucose, Bld: 83 mg/dL (ref 70–99)
Phosphorus: 3.2 mg/dL (ref 2.5–4.6)
Potassium: 4 mmol/L (ref 3.5–5.1)
Sodium: 134 mmol/L — ABNORMAL LOW (ref 135–145)

## 2023-01-31 LAB — CBC
HCT: 37.6 % (ref 36.0–46.0)
Hemoglobin: 11.8 g/dL — ABNORMAL LOW (ref 12.0–15.0)
MCH: 32.3 pg (ref 26.0–34.0)
MCHC: 31.4 g/dL (ref 30.0–36.0)
MCV: 103 fL — ABNORMAL HIGH (ref 80.0–100.0)
Platelets: 213 10*3/uL (ref 150–400)
RBC: 3.65 MIL/uL — ABNORMAL LOW (ref 3.87–5.11)
RDW: 23.7 % — ABNORMAL HIGH (ref 11.5–15.5)
WBC: 7.3 10*3/uL (ref 4.0–10.5)
nRBC: 0 % (ref 0.0–0.2)

## 2023-01-31 LAB — HEPATITIS B SURFACE ANTIBODY, QUANTITATIVE: Hep B S AB Quant (Post): 241 m[IU]/mL

## 2023-01-31 MED ORDER — ALPRAZOLAM 0.25 MG PO TABS: 0.2500 mg | ORAL_TABLET | Freq: Every day | ORAL | Status: DC | PRN

## 2023-01-31 NOTE — TOC Progression Note (Signed)
Transition of Care [facility]) - Progression Note    Patient Details  Name: Shawna [name] MRN: [id number] Date of Birth: 12/23/1940  Transition of Care [facility]) CM/SW Contact  Elliot Gault, LCSW Phone Number: 01/31/2023, 1:42 PM  Clinical Narrative:     TOC following. MD anticipating possible dc home tomorrow. TOC spoke with RN Darl Pikes at [facility] who states pt was active with them prior to admission and they will need resumption orders faxed to them at dc along with the dc summary.   Susan's phone number is [phone] and fax is [phone]. Assigned TOC will follow up in AM.  Expected Discharge Plan: Home w Home Health Services Barriers to Discharge: Continued Medical Work up  Expected Discharge Plan and Services In-house Referral: Clinical Social Work   Post Acute Care Choice: Resumption of Svcs/PTA Provider Living arrangements for the past 2 months: Single Family Home                                       Social Determinants of Health (SDOH) Interventions SDOH Screenings   Food Insecurity: No Food Insecurity (01/27/2023)  Housing: Low Risk  (01/27/2023)  Transportation Needs: No Transportation Needs (01/27/2023)  Utilities: Not At Risk (01/27/2023)  Financial Resource Strain: Low Risk  (12/14/2018)  Physical Activity: Unknown (12/14/2018)  Social Connections: Unknown (12/14/2018)  Stress: No Stress Concern Present (12/14/2018)  Tobacco Use: Low Risk  (01/30/2023)    Readmission Risk Interventions    01/29/2023   11:08 AM 10/31/2022   12:45 PM 08/29/2022    4:47 PM  Readmission Risk Prevention Plan  Transportation Screening Complete Complete Complete  Medication Review Oceanographer) Complete Complete Complete  PCP or Specialist appointment within 3-5 days of discharge Complete  Complete  HRI or Home Care Consult Complete Complete Complete  SW Recovery Care/Counseling Consult Complete Complete Complete  Palliative Care Screening Not Applicable Not Applicable Not  Applicable  Skilled Nursing Facility Not Applicable Not Applicable Complete

## 2023-01-31 NOTE — Plan of Care (Signed)
  Problem: Education: Goal: Knowledge of General Education information will improve Description: Including pain rating scale, medication(s)/side effects and non-pharmacologic comfort measures Outcome: Progressing   Problem: Health Behavior/Discharge Planning: Goal: Ability to manage health-related needs will improve Outcome: Progressing   Problem: Clinical Measurements: Goal: Ability to maintain clinical measurements within normal limits will improve Outcome: Progressing Goal: Will remain free from infection Outcome: Progressing Goal: Diagnostic test results will improve Outcome: Progressing Goal: Respiratory complications will improve Outcome: Progressing Goal: Cardiovascular complication will be avoided Outcome: Progressing   Problem: Activity: Goal: Risk for activity intolerance will decrease Outcome: Progressing   Problem: Nutrition: Goal: Adequate nutrition will be maintained Outcome: Progressing   Problem: Coping: Goal: Level of anxiety will decrease Outcome: Progressing   Problem: Elimination: Goal: Will not experience complications related to bowel motility Outcome: Progressing Goal: Will not experience complications related to urinary retention Outcome: Progressing   Problem: Pain Managment: Goal: General experience of comfort will improve Outcome: Progressing   Problem: Safety: Goal: Ability to remain free from injury will improve Outcome: Progressing   Problem: Skin Integrity: Goal: Risk for impaired skin integrity will decrease Outcome: Progressing   Problem: Education: Goal: Knowledge of disease and its progression will improve Outcome: Progressing Goal: Individualized Educational Video(s) Outcome: Progressing   Problem: Fluid Volume: Goal: Compliance with measures to maintain balanced fluid volume will improve Outcome: Progressing   Problem: Health Behavior/Discharge Planning: Goal: Ability to manage health-related needs will  improve Outcome: Progressing   Problem: Nutritional: Goal: Ability to make healthy dietary choices will improve Outcome: Progressing   Problem: Clinical Measurements: Goal: Complications related to the disease process, condition or treatment will be avoided or minimized Outcome: Progressing   

## 2023-01-31 NOTE — Progress Notes (Signed)
Patient drowsy as compared to report and staff from yesterday. Notified Dr. [name], new orders.

## 2023-01-31 NOTE — Progress Notes (Signed)
Mobility Specialist Progress Note:   01/31/23 1055  Mobility  Activity Ambulated with assistance in room;Transferred from bed to chair  Level of Assistance Moderate assist, patient does 50-74%  Assistive Device Front wheel walker  Distance Ambulated (ft) 10 ft  Activity Response Tolerated well  Mobility Referral Yes  $Mobility charge 1 Mobility  Mobility Specialist Start Time (ACUTE ONLY) 1000  Mobility Specialist Stop Time (ACUTE ONLY) 1010  Mobility Specialist Time Calculation (min) (ACUTE ONLY) 10 min   Pt received in bed, agreeable to transfer B>C and ambulate in room with RW. Pt required MinA to sit EOB. ModA/MaxA required to stand and ambulate in room d/t pt's knees buckling every few steps. Pt responded well to verbal cues when taking steps with RW. Had a tendency to push out RW without taking steps. Tolerated well, no complaints of dizziness/pain. Left pt in chair, alarm on, call bell in reach.   Feliciana Rossetti Mobility Specialist Please contact via Special educational needs teacher or  Rehab office at [phone]

## 2023-01-31 NOTE — Progress Notes (Signed)
Manufacturing engineer [facility]) Hospital Liaison Note  This patient is currently enrolled in [facility] outpatient-based palliative care.   [facility] will continue to follow for discharge disposition.   Please call for any outpatient based palliative care related questions or concerns.   Thank you.   Buck Mam [facility] Liaison [phone]

## 2023-01-31 NOTE — Progress Notes (Signed)
PROGRESS NOTE   Shawna [name]  NWG:[id number] DOB: [birth year] DOA: 01/27/2023 PCP: [facility]   Chief Complaint  Patient presents with   Abnormal Lab   Level of care: Telemetry  Brief Admission History:  82 y.o. female, with past medical history significant for hypertension, hyperlipidemia, CAD status post CABG and PCI, paroxysmal A-fib, no longer on anticoagulation due to recurrent GI bleed, ESRD on hemodialysis (MWF), seizure disorder, hypothyroidism.  Patient was sent to ED by HD center for low hemoglobin, patient reports generalized weakness, fatigue, intermittent dark stools, patient with known history of AVM in the past, supposed to be on subcutaneous octreotide which has not been started yet, she had labs done at her HD center today given her generalized weakness and fatigue, for which hemoglobin was noted to be 6.1, so she was sent for further workup, she denies nausea, vomiting, abdominal pain, no fever or chills.   In the ER her workup was significant for sodium of 130, hemoglobin of 6.4, platelet of 310, she was ordered 2 units PRBC, GI has been consulted, as well nephrology has been consulted as she has not had her HD that was due day of admission. [facility] hospitalist consulted to admit.   Assessment and Plan:  Acute on chronic blood loss anemia Upper GI bleed secondary to known AVMs -Presented with symptomatic anemia, melena, this is most likely in the setting of known AVM of small bowel -Started on octreotide per GI recommendation - long acting monthly injections -She had small bowel in uroscopy with duodenal diverticulum, with 6 nonbleeding angioplastic lesions in the proximal jejunum which has been treated with argon plasma coagulation in the past, GI has been consulted, will defer further workup and need for repeat enteroscopy to GI -s/p 2 units PRBC -SCD for DVT prophylaxis -tolerating heart healthy diet -Plan is for patient to go to Dr. Cato Mulligan office to  receive the monthly long acting octreotide injections.    ESRD -she received 2 units PRBC on 7/20 -HD planned for 7/24  Somnolence - suspect from medications given last night (xanax, gabapentin, tessalon) - holding DC home today; HD planned for tomorrow - added neuro checks and CT brain without contrast - possible home after HD tomorrow  Atrial tachycardia/paroxysmal atrial fibrillation/flutter/?WCT: -Has been seen by cardiology in the past, continue rhythm control medication with amiodarone and metoprolol -Not on anticoagulation secondary to recurrent GI bleed   Chronic systolic CHF with EF 30-35% CAD status post CABG with residual small vessel disease Chest pain due to demand ischemia: -Volume management with HD -No anticoagulation or antiplatelet therapy secondary to recurrent GI bleed, she denies any chest pain  Uncontrolled Hypertension - suspect her BP elevation is related to volume overload secondary to 2 units PRBC and missing HD yesterday.  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  Mild Cognitive Impairment - Pt has been sundowning - added delirium precautions - careful with sedating medications due to high sensitivity and ESRD   DVT prophylaxis: SCDs Code Status: Full  Family Communication: husband  Disposition: possible DC home after HD 7/24   Consultants:  GI Nephrology  Procedures:  Hemodialysis  Antimicrobials:    Subjective: Pt more somnolent today, had been given xanax, tessalon and gabapentin last night  Objective: Vitals:   01/30/23 1857 01/30/23 2023 01/31/23 0428 01/31/23 1112  BP:  (!) 172/54 (!) 174/61 (!) 187/70  Pulse:  64 (!) 59 (!) 50  Resp:  16    Temp:  98.5 F (36.9 C) 97.9 F (36.6 C) 98.6 F  (37 C)  TempSrc:   Oral Oral  SpO2:  100% 100% 99%  Weight: 50 kg     Height:        Intake/Output Summary (Last 24 hours) at 01/31/2023 1332 Last data filed at 01/31/2023 0900 Gross per 24 hour  Intake 100 ml  Output 2000 ml  Net -1900 ml   Filed Weights   01/29/23 0824 01/30/23 1507 01/30/23 1857  Weight: 50.2 kg 51.5 kg 50 kg   Examination:  General exam: elderly frail female, awake, somnolent but arousable, cooperative, Appears calm and chronically ill.  Respiratory system:  Respiratory effort normal. Cardiovascular system: normal S1 & S2 heard. Trace pedal edema. Gastrointestinal system: Abdomen is nondistended, soft and nontender. No organomegaly or masses felt. Normal bowel sounds heard. Central nervous system: somnolent, oriented x 2. No focal neurological deficits. Extremities: 1+ pretibial edema BLEs. Symmetric 5 x 5 power. Skin: No rashes, lesions or ulcers. Psychiatry: Judgement and insight appear poor. Mood & affect appropriate.   Data Reviewed: I have personally reviewed following labs and imaging studies  CBC: Recent Labs  Lab 01/27/23 1640 01/28/23 1651 01/31/23 0520  WBC 6.6 7.2 7.3  NEUTROABS 5.8  --   --   HGB 6.4* 12.5 11.8*  HCT 20.6* 38.1 37.6  MCV 113.2* 100.0 103.0*  PLT 310 252 213    Basic Metabolic Panel: Recent Labs  Lab 01/27/23 1640 01/28/23 1651 01/31/23 0520  NA 130* 130* 134*  K 3.7 3.9 4.0  CL 93* 92* 96*  CO2 25 25 26   GLUCOSE 150* 151* 83  BUN 24* 29* 16  CREATININE 5.50* 6.26* 3.39*  CALCIUM 8.2* 8.3* 8.6*  PHOS  --   --  3.2    CBG: No results for input(s): "GLUCAP" in the last 168 hours.  Recent Results (from the past 240 hour(s))  MRSA Next Gen by PCR, Nasal     Status: None   Collection Time: 01/28/23  6:05 PM   Specimen: Nasal Mucosa; Nasal Swab  Result Value Ref Range Status   MRSA by PCR Next Gen NOT DETECTED NOT DETECTED Final    Comment: (NOTE) The GeneXpert MRSA Assay (FDA approved for NASAL  specimens only), is one component of a comprehensive MRSA colonization surveillance program. It is not intended to diagnose MRSA infection nor to guide or monitor treatment for MRSA infections. Test performance is not FDA approved in patients less than 58 years old. Performed at [facility], [address], [place], [place] [address]      Radiology Studies: No results found.  Scheduled Meds:  sodium chloride   Intravenous Once   amiodarone  100 mg Oral Daily   calcitRIOL  1.75 mcg Oral Q M,W,F-HD   Chlorhexidine Gluconate Cloth  6 each Topical Q0600   Chlorhexidine Gluconate Cloth  6 each Topical Q0600   cinacalcet  30 mg Oral Q M,W,F-HD   folic acid  1 mg Oral Daily   hydrALAZINE  25 mg Oral BID   irbesartan  150 mg Oral Daily   levETIRAcetam  500 mg Oral Daily   levothyroxine  75 mcg Oral Q0600   metoprolol tartrate  12.5 mg Oral BID   multivitamin  1 tablet  Oral Daily   octreotide  100 mcg Subcutaneous TID   omeprazole  20 mg Oral BID AC   rosuvastatin  10 mg Oral Daily   Continuous Infusions:   LOS: 4 days   Time spent: 35 mins  Geoffry Bannister [name], MD How to contact the [facility] Attending or Consulting provider 7A - 7P or covering provider during after hours 7P -7A, for this patient?  Check the care team in [facility] and look for a) attending/consulting TRH provider listed and b) the [facility] team listed Log into [email or web address] and use [facility]'s universal password to access. If you do not have the password, please contact the hospital operator. Locate the [facility] provider you are looking for under [facility] and page to a number that you can be directly reached. If you still have difficulty reaching the provider, please page the [facility] (Director on Call) for the Hospitalists listed on amion for assistance.  01/31/2023, 1:32 PM

## 2023-01-31 NOTE — Progress Notes (Signed)
Admit: 01/27/2023 LOS: 4  72F ESRD MWF [facility] with recurrent GIB and ABLA, Hb 6.4 at presentation  Subjective:  HD yesterday with 2L UF Hb stable 11.8 No c/o this AM, sleepy  07/22 0701 - 07/23 0700 In: 120 [P.O.:120] Out: 2000   Filed Weights   01/29/23 0824 01/30/23 1507 01/30/23 1857  Weight: 50.2 kg 51.5 kg 50 kg    Scheduled Meds:  sodium chloride   Intravenous Once   amiodarone  100 mg Oral Daily   calcitRIOL  1.75 mcg Oral Q M,W,F-HD   Chlorhexidine Gluconate Cloth  6 each Topical Q0600   Chlorhexidine Gluconate Cloth  6 each Topical Q0600   cinacalcet  30 mg Oral Q M,W,F-HD   folic acid  1 mg Oral Daily   gabapentin  100 mg Oral QPM   hydrALAZINE  25 mg Oral BID   irbesartan  150 mg Oral Daily   levETIRAcetam  500 mg Oral Daily   levothyroxine  75 mcg Oral Q0600   metoprolol tartrate  12.5 mg Oral BID   multivitamin  1 tablet Oral Daily   octreotide  100 mcg Subcutaneous TID   omeprazole  20 mg Oral BID AC   rosuvastatin  10 mg Oral Daily   Continuous Infusions: PRN Meds:.acetaminophen, ALPRAZolam, benzonatate, hydrALAZINE, hydrOXYzine, nitroGLYCERIN, mouth rinse  Current Labs: reviewed   Physical Exam:  Blood pressure (!) 174/61, pulse (!) 59, temperature 97.9 F (36.6 C), temperature source Oral, resp. rate 16, height 5\' 2"  (1.575 m), weight 50 kg, SpO2 100%. Elderly femal NAD RRR no rub CTAB S/nt/nd  A ESRD MWF [facility] using TDC; no heparin Recurrent Anemia and chronic GIB per GI; rec 2u PRBC at presentation; Hb stable HTN: Trend with UF, on hydral, arb, BB; labile BMM / 2HPTH: cont C3; Ca stable, historical P well controlled  P HD tomorrow on schedule: 3K, 1-2L UF, TDC, no heparin.  Medication Issues; Preferred narcotic agents for pain control are hydromorphone, fentanyl, and methadone. Morphine should not be used.  Baclofen should be avoided Avoid oral sodium phosphate and magnesium citrate based laxatives / bowel preps    Sabra Heck MD 01/31/2023, 8:18 AM  Recent Labs  Lab 01/27/23 1640 01/28/23 1651 01/31/23 0520  NA 130* 130* 134*  K 3.7 3.9 4.0  CL 93* 92* 96*  CO2 25 25 26   GLUCOSE 150* 151* 83  BUN 24* 29* 16  CREATININE 5.50* 6.26* 3.39*  CALCIUM 8.2* 8.3* 8.6*  PHOS  --   --  3.2   Recent Labs  Lab 01/27/23 1640 01/28/23 1651 01/31/23 0520  WBC 6.6 7.2 7.3  NEUTROABS 5.8  --   --   HGB 6.4* 12.5 11.8*  HCT 20.6* 38.1 37.6  MCV 113.2* 100.0 103.0*  PLT 310 252 213

## 2023-02-01 DIAGNOSIS — Z515 Encounter for palliative care: Secondary | ICD-10-CM | POA: Diagnosis not present

## 2023-02-01 DIAGNOSIS — Z7189 Other specified counseling: Secondary | ICD-10-CM | POA: Diagnosis not present

## 2023-02-01 DIAGNOSIS — D62 Acute posthemorrhagic anemia: Secondary | ICD-10-CM | POA: Diagnosis not present

## 2023-02-01 LAB — RENAL FUNCTION PANEL
Albumin: 2.5 g/dL — ABNORMAL LOW (ref 3.5–5.0)
Anion gap: 11 (ref 5–15)
BUN: 32 mg/dL — ABNORMAL HIGH (ref 8–23)
CO2: 26 mmol/L (ref 22–32)
Calcium: 8.1 mg/dL — ABNORMAL LOW (ref 8.9–10.3)
Chloride: 96 mmol/L — ABNORMAL LOW (ref 98–111)
Creatinine, Ser: 5.39 mg/dL — ABNORMAL HIGH (ref 0.44–1.00)
GFR, Estimated: 7 mL/min — ABNORMAL LOW (ref >60)
Glucose, Bld: 108 mg/dL — ABNORMAL HIGH (ref 70–99)
Phosphorus: 3.8 mg/dL (ref 2.5–4.6)
Potassium: 4.5 mmol/L (ref 3.5–5.1)
Sodium: 133 mmol/L — ABNORMAL LOW (ref 135–145)

## 2023-02-01 LAB — CBC
HCT: 33.8 % — ABNORMAL LOW (ref 36.0–46.0)
Hemoglobin: 10.7 g/dL — ABNORMAL LOW (ref 12.0–15.0)
MCH: 32.4 pg (ref 26.0–34.0)
MCHC: 31.7 g/dL (ref 30.0–36.0)
MCV: 102.4 fL — ABNORMAL HIGH (ref 80.0–100.0)
Platelets: 241 10*3/uL (ref 150–400)
RBC: 3.3 MIL/uL — ABNORMAL LOW (ref 3.87–5.11)
RDW: 23.8 % — ABNORMAL HIGH (ref 11.5–15.5)
WBC: 7.3 10*3/uL (ref 4.0–10.5)
nRBC: 0 % (ref 0.0–0.2)

## 2023-02-01 MED ORDER — HEPARIN SODIUM (PORCINE) 1000 UNIT/ML IJ SOLN
INTRAMUSCULAR | Status: AC
  Filled 2023-02-01: qty 6

## 2023-02-01 MED ORDER — METOPROLOL TARTRATE 25 MG PO TABS: 12.5000 mg | ORAL_TABLET | Freq: Two times a day (BID) | ORAL | 0 refills | Status: DC

## 2023-02-01 MED ORDER — AMIODARONE HCL 100 MG PO TABS: 100.0000 mg | ORAL_TABLET | Freq: Every day | ORAL | 0 refills | Status: DC

## 2023-02-01 MED ORDER — CALCITRIOL 0.25 MCG PO CAPS
ORAL_CAPSULE | ORAL | Status: AC
  Filled 2023-02-01: qty 7

## 2023-02-01 MED ORDER — CINACALCET HCL 30 MG PO TABS
ORAL_TABLET | ORAL | Status: AC
  Filled 2023-02-01: qty 1

## 2023-02-01 MED ORDER — LEVOTHYROXINE SODIUM 75 MCG PO TABS: 75.0000 ug | ORAL_TABLET | Freq: Every day | ORAL | 0 refills | Status: DC

## 2023-02-01 MED ORDER — OMEPRAZOLE 20 MG PO CPDR: 20.0000 mg | DELAYED_RELEASE_CAPSULE | Freq: Two times a day (BID) | ORAL | 0 refills | Status: DC

## 2023-02-01 NOTE — TOC Transition Note (Signed)
Transition of Care [facility]) - CM/SW Discharge Note   Patient Details  Name: Shawna [name] MRN: [id number] Date of Birth: 06/21/1941  [facility] CM/SW Contact:  Villa Herb, LCSWA Phone Number: 02/01/2023, 11:01 AM   Clinical Narrative:    CSW updated that pt is medically ready for D/C home. CSW faxed D/C summary and HH orders to [facility] who pt is active with. CSW also called HH RN Darl Pikes to update of pts D/C. TOC signing off.   Final next level of care: Home w Home Health Services Barriers to Discharge: Barriers Resolved   Patient Goals and CMS Choice CMS Medicare.gov Compare Post Acute Care list provided to:: Patient Choice offered to / list presented to : Patient  Discharge Placement                         Discharge Plan and Services Additional resources added to the After Visit Summary for   In-house Referral: Clinical Social Work   Post Acute Care Choice: Resumption of Svcs/PTA Provider                               Social Determinants of Health (SDOH) Interventions SDOH Screenings   Food Insecurity: No Food Insecurity (01/27/2023)  Housing: Low Risk  (01/27/2023)  Transportation Needs: No Transportation Needs (01/27/2023)  Utilities: Not At Risk (01/27/2023)  Financial Resource Strain: Low Risk  (12/14/2018)  Physical Activity: Unknown (12/14/2018)  Social Connections: Unknown (12/14/2018)  Stress: No Stress Concern Present (12/14/2018)  Tobacco Use: Low Risk  (01/30/2023)     Readmission Risk Interventions    01/29/2023   11:08 AM 10/31/2022   12:45 PM 08/29/2022    4:47 PM  Readmission Risk Prevention Plan  Transportation Screening Complete Complete Complete  Medication Review Oceanographer) Complete Complete Complete  PCP or Specialist appointment within 3-5 days of discharge Complete  Complete  HRI or Home Care Consult Complete Complete Complete  SW Recovery Care/Counseling Consult Complete Complete Complete  Palliative Care  Screening Not Applicable Not Applicable Not Applicable  Skilled Nursing Facility Not Applicable Not Applicable Complete

## 2023-02-01 NOTE — Care Management Important Message (Signed)
Important Message  Patient Details  Name: Shawna [name] MRN: [id number] Date of Birth: Sep 24, 1940   Medicare Important Message Given:  Yes     [name] 02/01/2023, 3:01 PM

## 2023-02-01 NOTE — Progress Notes (Signed)
Palliative:   Shawna [name] is lying quietly in bed.  She appears acutely/chronically ill and quite frail.  She is resting comfortably, but wakes easily when I call her name.  She greets me, making and mostly keeping eye contact.  She is alert and oriented, able to make her needs known.  There is no family at bedside at this time.  We talk about her plan for discharge today.  She smiles.  She tells me that overall she feels ready for discharge.  We talk about her dialysis treatments, she is scheduled for HD today.  As we are meeting HD RN arrives to take her to the dialysis suite.  No questions or concerns at this time.  Plan: Continue full scope/full code.  Active with [facility] for outpatient palliative services.  Disposition to home.  35 minutes  Shawna Carmel, NP Palliative Medicine Team  Team Phone [phone] Greater than 50% of this time was spent counseling and coordinating care related to the above assessment and plan.

## 2023-02-01 NOTE — Discharge Summary (Signed)
Physician Discharge Summary  Shawna [name] ZOX:[id number] DOB: 11/10/1940 DOA: 01/27/2023  PCP: [facility]  Admit date: 01/27/2023  Discharge date: 02/01/2023  Admitted From:Home  Disposition:  Home  Recommendations for Outpatient Follow-up:  Follow up with PCP in 1-2 weeks Continue omeprazole twice daily as prescribed Octreotide IM to be arranged every 4 weeks per GI Follow-up CBC in 1 week to ensure stability of hemoglobin and hematocrit levels Continue home medications as listed Continue hemodialysis per routine with next session 7/26  Home Health: Yes with home health PT/RN  Equipment/Devices: None  Discharge Condition:Stable  CODE STATUS: Full  Diet recommendation: Heart Healthy  Brief/Interim Summary: 82 y.o. female, with past medical history significant for hypertension, hyperlipidemia, CAD status post CABG and PCI, paroxysmal A-fib, no longer on anticoagulation due to recurrent GI bleed, ESRD on hemodialysis (MWF), seizure disorder, hypothyroidism. Patient was sent to ED by HD center for low hemoglobin, patient reports generalized weakness, fatigue, intermittent dark stools, patient with known history of AVM in the past, supposed to be on subcutaneous octreotide which has not been started yet, she had labs done at her HD center today given her generalized weakness and fatigue, for which hemoglobin was noted to be 6.1, so she was sent for further workup, she denies nausea, vomiting, abdominal pain, no fever or chills.  She was admitted for acute on chronic blood loss anemia in the setting of upper GI bleed secondary to known AVMs.  She received 2 unit PRBC transfusion with improvement in hemoglobin levels and was seen by GI with plans for octreotide to be given monthly in the outpatient setting and to continue PPI twice daily.  She received hemodialysis during the course of the stay and is now in stable condition for discharge.  No other acute events or concerns  noted.  Discharge Diagnoses:  Principal Problem:   Acute blood loss anemia Active Problems:   AVM (arteriovenous malformation) of small bowel, acquired with hemorrhage   Acquired hypothyroidism   Chronic combined systolic and diastolic CHF (congestive heart failure) (HCC)   Atrial fibrillation, chronic (HCC)   Benign neoplasm of descending colon   Angiodysplasia of duodenum  Principal discharge diagnosis: Acute on chronic blood loss anemia status post 2 unit PRBC transfusion secondary to upper GI bleed with known AVMs.  Discharge Instructions  Discharge Instructions     Diet - low sodium heart healthy   Complete by: As directed    Increase activity slowly   Complete by: As directed       Allergies as of 02/01/2023       Reactions   Amlodipine Swelling   Aspirin Other (See Comments)   High Doses Mess up her stomach; "makes my bowels have blood in them". Takes 81 mg EC Aspirin    Nitrofurantoin Hives   Ranexa [ranolazine] Other (See Comments)   Myoclonus-hospitalized    Bactrim [sulfamethoxazole-trimethoprim] Rash   Contrast Media [iodinated Contrast Media] Itching   Iron Itching, Other (See Comments)   "they gave me iron in dialysis; had to give me Benadryl cause I had to have the iron" (05/02/2012)   Gabapentin Other (See Comments)   Unknown reaction   Sucroferric Oxyhydroxide Other (See Comments)   Unknown   Levaquin [levofloxacin In D5w] Rash   Plavix [clopidogrel Bisulfate] Rash   Protonix [pantoprazole Sodium] Rash   Venofer [ferric Oxide] Itching, Other (See Comments)   Patient reports using Benadryl prior to doses as [facility]  Medication List     TAKE these medications    ALPRAZolam 0.5 MG tablet Commonly known as: XANAX Take 1 tablet (0.5 mg total) by mouth daily as needed for up to 4 doses for anxiety.   amiodarone 100 MG tablet Commonly known as: PACERONE Take 1 tablet (100 mg total) by mouth daily. Start taking on: February 02, 2023 What changed:  medication strength how much to take   benzonatate 100 MG capsule Commonly known as: TESSALON Take 1 capsule (100 mg total) by mouth 3 (three) times daily as needed for cough.   calcitRIOL 0.25 MCG capsule Commonly known as: ROCALTROL Take 7 capsules (1.75 mcg total) by mouth every Monday, Wednesday, and Friday with hemodialysis.   cinacalcet 30 MG tablet Commonly known as: SENSIPAR Take 1 tablet (30 mg total) by mouth every Monday, Wednesday, and Friday with hemodialysis.   cyclobenzaprine 5 MG tablet Commonly known as: FLEXERIL Take 5 mg by mouth 3 (three) times daily as needed.   folic acid 1 MG tablet Commonly known as: FOLVITE Take 1 mg by mouth daily.   gabapentin 100 MG capsule Commonly known as: NEURONTIN Take 1 capsule (100 mg total) by mouth every evening.   guaiFENesin-codeine 100-10 MG/5ML syrup Take 5 mLs by mouth every 4 (four) hours as needed for cough.   hydrALAZINE 25 MG tablet Commonly known as: APRESOLINE Take 1 tablet (25 mg total) by mouth 2 (two) times daily. Hold for sbp <110   hydrOXYzine 25 MG tablet Commonly known as: ATARAX Take 25 mg by mouth every 6 (six) hours as needed for itching.   levETIRAcetam 500 MG tablet Commonly known as: KEPPRA Take 1 tablet (500 mg total) by mouth daily.   levothyroxine 75 MCG tablet Commonly known as: SYNTHROID Take 1 tablet (75 mcg total) by mouth daily at 6 (six) AM. Start taking on: February 02, 2023 What changed: when to take this   metoprolol tartrate 25 MG tablet Commonly known as: LOPRESSOR Take 0.5 tablets (12.5 mg total) by mouth 2 (two) times daily. What changed:  how much to take additional instructions   multivitamin Tabs tablet Take 1 tablet by mouth daily.   nitroGLYCERIN 0.4 MG SL tablet Commonly known as: NITROSTAT Place 0.4 mg under the tongue every 5 (five) minutes as needed for chest pain.   olmesartan 20 MG tablet Commonly known as: BENICAR Take 20 mg by  mouth daily.   omeprazole 20 MG capsule Commonly known as: PRILOSEC Take 1 capsule (20 mg total) by mouth 2 (two) times daily before a meal.   ondansetron 4 MG disintegrating tablet Commonly known as: ZOFRAN-ODT Take 4 mg by mouth every 8 (eight) hours as needed for nausea or vomiting.   rosuvastatin 10 MG tablet Commonly known as: CRESTOR Take 1 tablet by mouth once daily   SandoSTATIN LAR [place] 20 MG injection Generic drug: octreotide Inject 20 mg into the muscle every 28 (twenty-eight) days.        Follow-up Information     [facility]. Schedule an appointment as soon as possible for a visit in 1 week(s).   Contact information: [address] [place] [address] [phone]                Allergies  Allergen Reactions   Amlodipine Swelling   Aspirin Other (See Comments)    High Doses Mess up her stomach; "makes my bowels have blood in them". Takes 81 mg EC Aspirin    Nitrofurantoin Hives  Ranexa [Ranolazine] Other (See Comments)    Myoclonus-hospitalized    Bactrim [Sulfamethoxazole-Trimethoprim] Rash   Contrast Media [Iodinated Contrast Media] Itching   Iron Itching and Other (See Comments)    "they gave me iron in dialysis; had to give me Benadryl cause I had to have the iron" (05/02/2012)   Gabapentin Other (See Comments)    Unknown reaction   Sucroferric Oxyhydroxide Other (See Comments)    Unknown   Levaquin [Levofloxacin In D5w] Rash   Plavix [Clopidogrel Bisulfate] Rash   Protonix [Pantoprazole Sodium] Rash   Venofer [Ferric Oxide] Itching and Other (See Comments)    Patient reports using Benadryl prior to doses as [facility]    Consultations: GI Nephrology   Procedures/Studies: CT HEAD WO CONTRAST ( )  Result Date: 01/31/2023 CLINICAL DATA:  Altered mental status EXAM: CT HEAD WITHOUT CONTRAST TECHNIQUE: Contiguous axial images were obtained from the base of the skull through the vertex without intravenous contrast.  RADIATION DOSE REDUCTION: This exam was performed according to the departmental dose-optimization program which includes automated exposure control, adjustment of the mA and/or kV according to patient size and/or use of iterative reconstruction technique. COMPARISON:  Brain MRI 10/31/2022 FINDINGS: Brain: There is no acute intracranial hemorrhage, extra-axial fluid collection, or acute infarct. Thin hypodense subdural hygromas are again seen measuring 3-4 mm on the left and 1-2 mm on the right. The left-sided collection is decreased since the CT from 10/30/2022. There is no mass effect on the underlying brain parenchyma. Parenchymal volume is stable. The ventricles are stable in size. Confluent hypodensity in the supratentorial white matter likely reflecting sequela of underlying chronic small-vessel ischemic change is stable. Small remote infarcts are again seen in the basal ganglia. The pituitary and suprasellar region are normal. There is no mass lesion there is no mass effect or midline shift. Vascular: There is calcification of the bilateral carotid siphons. Skull: Normal. Negative for fracture or focal lesion. Sinuses/Orbits: The imaged paranasal sinuses are clear. The globes and orbits are unremarkable. Other: The mastoid air cells and middle ear cavities are clear. IMPRESSION: 1. No acute intracranial pathology. 2. Thin bilateral subdural hygromas, decreased in size on the left since April. Electronically Signed   By: Lesia Hausen M.D.   On: 01/31/2023 16:29   DG Chest 2 View  Result Date: 01/17/2023 CLINICAL DATA:  Cough x1 week. EXAM: CHEST - 2 VIEW COMPARISON:  December 27, 2022 FINDINGS: There is stable left-sided venous catheter positioning. Multiple sternal wires and vascular clips are present. The cardiac silhouette is mildly enlarged. A coronary artery stent is in place. Mild, diffuse, chronic appearing increased lung markings are seen with mild atelectasis and/or infiltrate noted within the left lung  base. Very mild lateral right basilar atelectasis is also seen. A very small left pleural effusion is seen. No pneumothorax is identified. Multilevel degenerative changes are seen throughout the thoracic spine. IMPRESSION: 1. Evidence of prior median sternotomy/CABG. 2. Stable cardiomegaly with mild left basilar atelectasis and/or infiltrate. 3. Very small left pleural effusion. Electronically Signed   By: Aram Candela M.D.   On: 01/17/2023 22:22     Discharge Exam: Vitals:   02/01/23 0600 02/01/23 0828  BP: (!) 184/58 (!) 159/60  Pulse:  (!) 55  Resp:    Temp:    SpO2:     Vitals:   01/31/23 2130 02/01/23 0407 02/01/23 0600 02/01/23 0828  BP:  (!) 191/74 (!) 184/58 (!) 159/60  Pulse: 96   (!) 55  Resp:  18    Temp:  98.4 F (36.9 C)    TempSrc:      SpO2:  99%    Weight:      Height:        General: Pt is alert, awake, not in acute distress Cardiovascular: RRR, S1/S2 +, no rubs, no gallops Respiratory: CTA bilaterally, no wheezing, no rhonchi Abdominal: Soft, NT, ND, bowel sounds + Extremities: no edema, no cyanosis    The results of significant diagnostics from this hospitalization (including imaging, microbiology, ancillary and laboratory) are listed below for reference.     Microbiology: Recent Results (from the past 240 hour(s))  MRSA Next Gen by PCR, Nasal     Status: None   Collection Time: 01/28/23  6:05 PM   Specimen: Nasal Mucosa; Nasal Swab  Result Value Ref Range Status   MRSA by PCR Next Gen NOT DETECTED NOT DETECTED Final    Comment: (NOTE) The GeneXpert MRSA Assay (FDA approved for NASAL specimens only), is one component of a comprehensive MRSA colonization surveillance program. It is not intended to diagnose MRSA infection nor to guide or monitor treatment for MRSA infections. Test performance is not FDA approved in patients less than 40 years old. Performed at [facility], [address], [place], [place] [address]      Labs: BNP (last 3  results) Recent Labs    08/06/22 0139 08/26/22 1410 10/23/22 1420  BNP 1,666.2* 1,590.0* 2,205.0*   Basic Metabolic Panel: Recent Labs  Lab 01/27/23 1640 01/28/23 1651 01/31/23 0520  NA 130* 130* 134*  K 3.7 3.9 4.0  CL 93* 92* 96*  CO2 25 25 26   GLUCOSE 150* 151* 83  BUN 24* 29* 16  CREATININE 5.50* 6.26* 3.39*  CALCIUM 8.2* 8.3* 8.6*  PHOS  --   --  3.2   Liver Function Tests: Recent Labs  Lab 01/27/23 1640 01/31/23 0520  AST 8*  --   ALT 10  --   ALKPHOS 83  --   BILITOT 1.0  --   PROT 6.8  --   ALBUMIN 2.9* 2.7*   No results for input(s): "LIPASE", "AMYLASE" in the last 168 hours. No results for input(s): "AMMONIA" in the last 168 hours. CBC: Recent Labs  Lab 01/27/23 1640 01/28/23 1651 01/31/23 0520  WBC 6.6 7.2 7.3  NEUTROABS 5.8  --   --   HGB 6.4* 12.5 11.8*  HCT 20.6* 38.1 37.6  MCV 113.2* 100.0 103.0*  PLT 310 252 213   Cardiac Enzymes: No results for input(s): "CKTOTAL", "CKMB", "CKMBINDEX", "TROPONINI" in the last 168 hours. BNP: Invalid input(s): "POCBNP" CBG: No results for input(s): "GLUCAP" in the last 168 hours. D-Dimer No results for input(s): "DDIMER" in the last 72 hours. Hgb A1c No results for input(s): "HGBA1C" in the last 72 hours. Lipid Profile No results for input(s): "CHOL", "HDL", "LDLCALC", "TRIG", "CHOLHDL", "LDLDIRECT" in the last 72 hours. Thyroid function studies No results for input(s): "TSH", "T4TOTAL", "T3FREE", "THYROIDAB" in the last 72 hours.  Invalid input(s): "FREET3" Anemia work up No results for input(s): "VITAMINB12", "FOLATE", "FERRITIN", "TIBC", "IRON", "RETICCTPCT" in the last 72 hours. Urinalysis    Component Value Date/Time   COLORURINE YELLOW 12/16/2012 1919   APPEARANCEUR CLOUDY (A) 12/16/2012 1919   LABSPEC 1.009 12/16/2012 1919   PHURINE 7.5 12/16/2012 1919   GLUCOSEU NEGATIVE 12/16/2012 1919   HGBUR TRACE (A) 12/16/2012 1919   BILIRUBINUR NEGATIVE 12/16/2012 1919   KETONESUR NEGATIVE  12/16/2012 1919   PROTEINUR 100 (A)  12/16/2012 1919   UROBILINOGEN 0.2 12/16/2012 1919   NITRITE NEGATIVE 12/16/2012 1919   LEUKOCYTESUR SMALL (A) 12/16/2012 1919   Sepsis Labs Recent Labs  Lab 01/27/23 1640 01/28/23 1651 01/31/23 0520  WBC 6.6 7.2 7.3   Microbiology Recent Results (from the past 240 hour(s))  MRSA Next Gen by PCR, Nasal     Status: None   Collection Time: 01/28/23  6:05 PM   Specimen: Nasal Mucosa; Nasal Swab  Result Value Ref Range Status   MRSA by PCR Next Gen NOT DETECTED NOT DETECTED Final    Comment: (NOTE) The GeneXpert MRSA Assay (FDA approved for NASAL specimens only), is one component of a comprehensive MRSA colonization surveillance program. It is not intended to diagnose MRSA infection nor to guide or monitor treatment for MRSA infections. Test performance is not FDA approved in patients less than 39 years old. Performed at [facility], [address] [place], [place] [address]      Time coordinating discharge: 35 minutes  SIGNED:   Erick Blinks, DO [facility] 02/01/2023, 10:00 AM  If 7PM-7AM, please contact night-coverage [email or web address]

## 2023-02-01 NOTE — Progress Notes (Signed)
Admit: 01/27/2023 LOS: 5  87F ESRD MWF [facility] with recurrent GIB and ABLA, Hb 6.4 at presentation  Subjective:  HD 7/22 with 2L UF Hb stable 11.8 yesterday but refused labs this AM No c/o this AM, sleepy-  due for HD today   07/23 0701 - 07/24 0700 In: 580 [P.O.:580] Out: -   Filed Weights   01/29/23 0824 01/30/23 1507 01/30/23 1857  Weight: 50.2 kg 51.5 kg 50 kg    Scheduled Meds:  sodium chloride   Intravenous Once   amiodarone  100 mg Oral Daily   calcitRIOL  1.75 mcg Oral Q M,W,F-HD   Chlorhexidine Gluconate Cloth  6 each Topical Q0600   Chlorhexidine Gluconate Cloth  6 each Topical Q0600   cinacalcet  30 mg Oral Q M,W,F-HD   folic acid  1 mg Oral Daily   hydrALAZINE  25 mg Oral BID   irbesartan  150 mg Oral Daily   levETIRAcetam  500 mg Oral Daily   levothyroxine  75 mcg Oral Q0600   metoprolol tartrate  12.5 mg Oral BID   multivitamin  1 tablet Oral Daily   octreotide  100 mcg Subcutaneous TID   omeprazole  20 mg Oral BID AC   rosuvastatin  10 mg Oral Daily   Continuous Infusions: PRN Meds:.acetaminophen, ALPRAZolam, hydrALAZINE, hydrOXYzine, nitroGLYCERIN, mouth rinse  Current Labs: reviewed   Physical Exam:  Blood pressure (!) 159/60, pulse (!) 55, temperature 98.4 F (36.9 C), resp. rate 18, height 5\' 2"  (1.575 m), weight 50 kg, SpO2 99%. Elderly femal NAD RRR no rub CTAB S/nt/nd  A ESRD MWF [facility] using TDC; no heparin Recurrent Anemia and chronic GIB per GI; rec 2u PRBC at presentation; Hb over 11 yest-  no checked today yet HTN: Trend with UF, on hydral, arb, BB; labile BMM / 2HPTH: cont C3; Ca stable, historical P well controlled  P HD today on schedule: 3K, 1-2L UF, TDC, no heparin.  Medication Issues; Preferred narcotic agents for pain control are hydromorphone, fentanyl, and methadone. Morphine should not be used.  Baclofen should be avoided Avoid oral sodium phosphate and magnesium citrate based laxatives / bowel preps    Cecille Aver  02/01/2023, 8:48 AM  Recent Labs  Lab 01/27/23 1640 01/28/23 1651 01/31/23 0520  NA 130* 130* 134*  K 3.7 3.9 4.0  CL 93* 92* 96*  CO2 25 25 26   GLUCOSE 150* 151* 83  BUN 24* 29* 16  CREATININE 5.50* 6.26* 3.39*  CALCIUM 8.2* 8.3* 8.6*  PHOS  --   --  3.2   Recent Labs  Lab 01/27/23 1640 01/28/23 1651 01/31/23 0520  WBC 6.6 7.2 7.3  NEUTROABS 5.8  --   --   HGB 6.4* 12.5 11.8*  HCT 20.6* 38.1 37.6  MCV 113.2* 100.0 103.0*  PLT 310 252 213

## 2023-02-01 NOTE — Progress Notes (Signed)
Patient refused to have labs drawn this morning. 

## 2023-02-01 NOTE — Progress Notes (Signed)
   HEMODIALYSIS TREATMENT NOTE:  UF was limited by labile blood pressure.  Net UF 1 liter removed.  All blood was returned.  Meds given: Sensipar, Calcitriol.  Post-treatment:  02/01/23 1520  Vitals  Temp 98.1 F (36.7 C)  Temp Source Oral  BP (!) 134/48  MAP (mmHg) 72  BP Location Right Arm  BP Method Automatic  Patient Position (if appropriate) Lying  Pulse Rate 67  Pulse Rate Source Monitor  ECG Heart Rate 68  Resp 18  Post Treatment  Dialyzer Clearance Lightly streaked  Duration of HD Treatment -hour(s) 3.5 hour(s)  Hemodialysis Intake (mL) 0 mL  Liters Processed 63  Fluid Removed (mL) 1000 mL  Tolerated HD Treatment Yes  Post-Hemodialysis Comments Adjust goal met.  Hemodialysis Catheter Left Subclavian Double lumen Temporary (Non-Tunneled)  Placement Date: 06/09/22   Placed prior to admission: Yes  Orientation: Left  Access Location: Subclavian  Hemodialysis Catheter Type: Double lumen Temporary (Non-Tunneled)  Site Condition No complications  Blue Lumen Status Flushed;Heparin locked;Dead end cap in place  Red Lumen Status Flushed;Heparin locked;Dead end cap in place  Purple Lumen Status N/A  Catheter fill solution Heparin 1000 units/ml  Catheter fill volume (Arterial) 1.9 cc  Catheter fill volume (Venous) 1.9  Dressing Type Transparent;Tube stabilization device  Dressing Status Antimicrobial disc in place;Clean, Dry, Intact  Drainage Description None  Dressing Change Due 02/04/23  Post treatment catheter status Capped and Clamped   Arman Filter, RN [facility]

## 2023-02-07 NOTE — Progress Notes (Signed)
Cardiology Office Note:  .   Date:  02/21/2023  ID:  Shawna [name], DOB 1940/08/01, MRN [id number] PCP: [facility]  [facility] Providers Cardiologist:  Dina Rich, MD    History of Present Illness: .   Shawna [name] is a 82 y.o. female  with CAD status post CABG, ESRD hemodialysis, systolic heart failure, left bundle branch block, paroxysmal atrial fibrillation/flutter not on anticoagulation, history of GI bleed, TIA  Patient was seen in the hospital  12/17/2022 with chest pain and atrial tachycardia and concern for [facility] but felt likely atrial tachycardia with LBBB.  Readmission 01/2023 for GIB requiring transfusion. In ED 02/10/23 with fatigue during dialysis and unable to respond to surroundings briefly but no LOC, seizure activity. K 3.0, CT head ok. EKG sinus brady 49/m PAC's.  Patient comes in with her husband. Today she feels well. No chest pain or SOB Legs swell up some and she does eat some frozen dinners. They didn't bring her meds and amio and metoprolol doses have been reduced in computer but we will need to verify.  ROS:    Studies Reviewed: Marland Kitchen         Prior CV Studies:   TTE 07/16/2021 1. Left ventricular ejection fraction, by estimation, is 30 to 35%. The  left ventricle has moderately decreased function. The left ventricle  demonstrates regional wall motion abnormalities (see scoring  diagram/findings for description). There is mild  left ventricular hypertrophy. Left ventricular diastolic parameters are  consistent with Grade II diastolic dysfunction (pseudonormalization).  Elevated left atrial pressure.   2. Right ventricular systolic function is moderately reduced. The right  ventricular size is normal. There is mildly elevated pulmonary artery  systolic pressure. The estimated right ventricular systolic pressure is  43.4 mmHg.   3. Left atrial size was severely dilated.   4. Right atrial size was mildly dilated.   5. The mitral valve is  degenerative. Moderate mitral valve regurgitation.  Mild to moderate mitral stenosis. MG at 70bpm, MVA 1.2 cm^2 by  continuity equation. Moderate mitral annular calcification.   6. Tricuspid valve regurgitation is mild to moderate.   7. The aortic valve is tricuspid. Aortic valve regurgitation is not  visualized. Aortic valve sclerosis/calcification is present, without any  evidence of aortic stenosis.   8. The inferior vena cava is normal in size with greater than 50%  respiratory variability, suggesting right atrial pressure of 3 mmHg.    LHC 07/22/2021   Mid LM to Dist LM lesion is 60% stenosed.   Prox LAD lesion is 90% stenosed.   Ost Cx to Mid Cx lesion is 99% stenosed.   Ost RCA to Prox RCA lesion is 100% stenosed.   Ost Ramus to Ramus lesion is 80% stenosed.   Ost 1st Diag to 1st Diag lesion is 100% stenosed.   Ost RPDA to RPDA lesion is 75% stenosed.   2nd Mrg lesion is 60% stenosed.   Dist LAD lesion is 70% stenosed.   LIMA and is large.   SVG and is large.   SVG and is normal in caliber.   SVG and is large.   The graft exhibits no disease.   The graft exhibits no disease.   The graft exhibits no disease.   LV end diastolic pressure is mildly elevated.   The left ventricular ejection fraction is 35-45% by visual estimate.        Risk Assessment/Calculations:     HYPERTENSION CONTROL Vitals:  02/21/23 1139 02/21/23 1205  BP: (!) 166/64 (!) 150/80    The patient's blood pressure is elevated above target today.  In order to address the patient's elevated BP: Blood pressure will be monitored at home to determine if medication changes need to be made.; Follow up with primary care provider for management.          Physical Exam:   VS:  BP (!) 150/80   Pulse (!) 58   Wt 123 lb (55.8 kg)   SpO2 96%   BMI 22.50 kg/m    Wt Readings from Last 3 Encounters:  02/21/23 123 lb (55.8 kg)  02/10/23 119 lb (54 kg)  02/01/23 112 lb 7 oz (51 kg)    GEN: Well  nourished, well developed in no acute distress NECK: No JVD; No carotid bruits CARDIAC:  RRR, 2/6 systolic murmur LSB RESPIRATORY:  Clear to auscultation without rales, wheezing or rhonchi  ABDOMEN: Soft, non-tender, non-distended EXTREMITIES:  trace edema; No deformity   ASSESSMENT AND PLAN: .    PAF/flutter/atrial tachycardia on amiodarone, metoprolol. No anticoag with history of GIB. EKG 02/10/23 sinus brady 49/m PAC's.Med list shows  decrease metoprolol 12.5 mg bid and  amio 100 mg daily. Will call pharmacy to verify meds.  Chronic systolic CHF EF 30-35% volume managed with dialysis-ankles mild edema-has dialysis tomorrow  HTN-BP up today. Not sure of meds and getting extra salt. Will verify meds. She has dialysis tomorrow and appt with PCP  CAD CABG and small vell disease-no angina  ESRD on HD-temporary catheter awaiting permanent.        Dispo: f/u Dr. Wyline Mood 6 months.  Signed, Jacolyn Reedy, PA-C

## 2023-02-09 ENCOUNTER — Telehealth: Payer: Self-pay | Admitting: Pharmacy Technician

## 2023-02-09 NOTE — Telephone Encounter (Addendum)
Auth Submission: NO AUTH NEEDED Site of care: Site of care: [facility] Payer: [employer] MEDICARE Medication & CPT/J Code(s) submitted:  OCTREOTIDE Route of submission (phone, fax, portal): PORTA Phone # Fax # Auth type: Buy/Bill PB Units/visits requested: 20MG  Q 28DAYS Reference number: [id number] Approval from: 02/09/23 to 07/11/23

## 2023-02-10 ENCOUNTER — Other Ambulatory Visit: Payer: Self-pay

## 2023-02-10 ENCOUNTER — Emergency Department (HOSPITAL_COMMUNITY): Payer: Medicare Other

## 2023-02-10 ENCOUNTER — Emergency Department (HOSPITAL_COMMUNITY)
Admission: EM | Admit: 2023-02-10 | Discharge: 2023-02-10 | Disposition: A | Discharge status: Home / Self Care | Payer: Medicare Other | Attending: Emergency Medicine | Admitting: Emergency Medicine

## 2023-02-10 DIAGNOSIS — I132 Hypertensive heart and chronic kidney disease with heart failure and with stage 5 chronic kidney disease, or end stage renal disease: Secondary | ICD-10-CM | POA: Diagnosis not present

## 2023-02-10 DIAGNOSIS — R5383 Other fatigue: Secondary | ICD-10-CM

## 2023-02-10 DIAGNOSIS — I509 Heart failure, unspecified: Secondary | ICD-10-CM | POA: Diagnosis not present

## 2023-02-10 DIAGNOSIS — Z79899 Other long term (current) drug therapy: Secondary | ICD-10-CM | POA: Insufficient documentation

## 2023-02-10 DIAGNOSIS — D649 Anemia, unspecified: Secondary | ICD-10-CM | POA: Diagnosis not present

## 2023-02-10 DIAGNOSIS — Z992 Dependence on renal dialysis: Secondary | ICD-10-CM | POA: Diagnosis not present

## 2023-02-10 DIAGNOSIS — R531 Weakness: Secondary | ICD-10-CM | POA: Diagnosis present

## 2023-02-10 DIAGNOSIS — N186 End stage renal disease: Secondary | ICD-10-CM | POA: Insufficient documentation

## 2023-02-10 DIAGNOSIS — I2510 Atherosclerotic heart disease of native coronary artery without angina pectoris: Secondary | ICD-10-CM | POA: Diagnosis not present

## 2023-02-10 LAB — CBC
HCT: 28.1 % — ABNORMAL LOW (ref 36.0–46.0)
Hemoglobin: 8.8 g/dL — ABNORMAL LOW (ref 12.0–15.0)
MCH: 33.5 pg (ref 26.0–34.0)
MCHC: 31.3 g/dL (ref 30.0–36.0)
MCV: 106.8 fL — ABNORMAL HIGH (ref 80.0–100.0)
Platelets: 137 10*3/uL — ABNORMAL LOW (ref 150–400)
RBC: 2.63 MIL/uL — ABNORMAL LOW (ref 3.87–5.11)
RDW: 24.8 % — ABNORMAL HIGH (ref 11.5–15.5)
WBC: 6.8 10*3/uL (ref 4.0–10.5)
nRBC: 0 % (ref 0.0–0.2)

## 2023-02-10 LAB — POC OCCULT BLOOD, ED: Fecal Occult Bld: NEGATIVE

## 2023-02-10 LAB — COMPREHENSIVE METABOLIC PANEL WITH GFR
ALT: 9 U/L (ref 0–44)
AST: 9 U/L — ABNORMAL LOW (ref 15–41)
Albumin: 2.4 g/dL — ABNORMAL LOW (ref 3.5–5.0)
Alkaline Phosphatase: 63 U/L (ref 38–126)
Anion gap: 8 (ref 5–15)
BUN: 23 mg/dL (ref 8–23)
CO2: 26 mmol/L (ref 22–32)
Calcium: 7.9 mg/dL — ABNORMAL LOW (ref 8.9–10.3)
Chloride: 97 mmol/L — ABNORMAL LOW (ref 98–111)
Creatinine, Ser: 4.08 mg/dL — ABNORMAL HIGH (ref 0.44–1.00)
GFR, Estimated: 10 mL/min — ABNORMAL LOW
Glucose, Bld: 128 mg/dL — ABNORMAL HIGH (ref 70–99)
Potassium: 3 mmol/L — ABNORMAL LOW (ref 3.5–5.1)
Sodium: 131 mmol/L — ABNORMAL LOW (ref 135–145)
Total Bilirubin: 0.7 mg/dL (ref 0.3–1.2)
Total Protein: 5.4 g/dL — ABNORMAL LOW (ref 6.5–8.1)

## 2023-02-10 LAB — CBG MONITORING, ED: Glucose-Capillary: 109 mg/dL — ABNORMAL HIGH (ref 70–99)

## 2023-02-10 NOTE — Discharge Instructions (Addendum)
Your lab test, exam and CT scan are reassuring today.  With no ongoing symptoms we feel it is safe for you to go home this evening.  However get rechecked immediately if you have a return of your symptoms or any new symptoms develop.  Your hemoglobin is lower today at 8.8 then when you were discharged home from your last hospitalization.  It will be important that you have your hemoglobin rechecked at dialysis on Monday to ensure it is not lower.  In the interim, if you have any return of your weakness or if you develop any rectal bleeding you need to return here immediately for further testing.

## 2023-02-10 NOTE — ED Provider Notes (Signed)
[place] EMERGENCY DEPARTMENT AT [facility] Provider Note   CSN: [id number] Arrival date & time: 02/10/23  1351     History {Add pertinent medical, surgical, social history, OB history to HPI:1} Chief Complaint  Patient presents with   Altered Mental Status    Shawna [name] is a 82 y.o. female with a history most significant for ESRD on dialysis, CAD, history of TIA, GERD, hypertension, CHF, paroxysmal A-fib and recent seizure at activity noted during hospitalization in April for which she has been maintained on Keppra although her EEG was negative during that workup.  She presents today secondary to profound fatigue and weakness and a transient inability to respond to her caretakers during dialysis.  She describes being about an hour into her dialysis session when she became very fatigued, close her eyes and was aware of her surroundings as she recalls her nurse asking her if she feels okay, but the patient was unable to respond  to her.  There is no seizure-like activity.  Patient denies chest pain, shortness of breath, palpitations, headache and denies confusion, auditory or visual hallucinations, fevers or chills.  No abdominal pain, nausea or vomiting.  She does not produce urine.  The history is provided by the patient and the spouse.       Home Medications Prior to Admission medications   Medication Sig Start Date End Date Taking? Authorizing Provider  ALPRAZolam Prudy Feeler) 0.5 MG tablet Take 1 tablet (0.5 mg total) by mouth daily as needed for up to 4 doses for anxiety. Patient not taking: Reported on 01/28/2023 12/19/22   Lanae Boast, MD  amiodarone (PACERONE) 100 MG tablet Take 1 tablet (100 mg total) by mouth daily. 02/02/23 03/04/23  Sherryll Burger, Pratik D, DO  benzonatate (TESSALON) 100 MG capsule Take 1 capsule (100 mg total) by mouth 3 (three) times daily as needed for cough. 01/18/23   Tilden Fossa, MD  calcitRIOL (ROCALTROL) 0.25 MCG capsule Take 7 capsules (1.75 mcg total)  by mouth every Monday, Wednesday, and Friday with hemodialysis. 12/21/22   Lanae Boast, MD  cinacalcet (SENSIPAR) 30 MG tablet Take 1 tablet (30 mg total) by mouth every Monday, Wednesday, and Friday with hemodialysis. Patient not taking: Reported on 01/28/2023 12/19/22   Lanae Boast, MD  cyclobenzaprine (FLEXERIL) 5 MG tablet Take 5 mg by mouth 3 (three) times daily as needed.    [provider]  folic acid (FOLVITE) 1 MG tablet Take 1 mg by mouth daily. 05/24/22   [provider]  gabapentin (NEURONTIN) 100 MG capsule Take 1 capsule (100 mg total) by mouth every evening. 12/13/22   Jerald Kief, MD  guaiFENesin-codeine 100-10 MG/5ML syrup Take 5 mLs by mouth every 4 (four) hours as needed for cough. 10/23/22   Jacalyn Lefevre, MD  hydrALAZINE (APRESOLINE) 25 MG tablet Take 1 tablet (25 mg total) by mouth 2 (two) times daily. Hold for sbp <110 12/27/22   Lanae Boast, MD  hydrOXYzine (ATARAX) 25 MG tablet Take 25 mg by mouth every 6 (six) hours as needed for itching. 06/06/22   [provider]  levETIRAcetam (KEPPRA) 500 MG tablet Take 1 tablet (500 mg total) by mouth daily. 11/15/22   Leroy Sea, MD  levothyroxine (SYNTHROID) 75 MCG tablet Take 1 tablet (75 mcg total) by mouth daily at 6 (six) AM. 02/02/23   Sherryll Burger, Pratik D, DO  metoprolol tartrate (LOPRESSOR) 25 MG tablet Take 0.5 tablets (12.5 mg total) by mouth 2 (two) times daily. 02/01/23 03/03/23  Sherryll Burger, Pratik D, DO  multivitamin (RENA-VIT) TABS tablet Take 1 tablet by mouth daily.    [provider]  nitroGLYCERIN (NITROSTAT) 0.4 MG SL tablet Place 0.4 mg under the tongue every 5 (five) minutes as needed for chest pain.    [provider]  octreotide (SANDOSTATIN LAR) 20 MG injection Inject 20 mg into the muscle every 28 (twenty-eight) days. 11/15/22   Leroy Sea, MD  olmesartan (BENICAR) 20 MG tablet Take 20 mg by mouth daily. 01/14/23   [provider]  omeprazole (PRILOSEC) 20 MG capsule  Take 1 capsule (20 mg total) by mouth 2 (two) times daily before a meal. 02/01/23 03/03/23  Sherryll Burger, Pratik D, DO  ondansetron (ZOFRAN-ODT) 4 MG disintegrating tablet Take 4 mg by mouth every 8 (eight) hours as needed for nausea or vomiting. 05/05/22   [provider]  rosuvastatin (CRESTOR) 10 MG tablet Take 1 tablet by mouth once daily 06/06/22   Iran Ouch, [place] M, PA-C      Allergies    Amlodipine, Aspirin, Nitrofurantoin, Ranexa [ranolazine], Bactrim [sulfamethoxazole-trimethoprim], Contrast media [iodinated contrast media], Iron, Gabapentin, Sucroferric oxyhydroxide, Levaquin [levofloxacin in d5w], Plavix [clopidogrel bisulfate], Protonix [pantoprazole sodium], and Venofer [ferric oxide]    Review of Systems   Review of Systems  Constitutional:  Positive for fatigue. Negative for fever.  HENT:  Negative for congestion and sore throat.   Eyes: Negative.   Respiratory:  Negative for chest tightness and shortness of breath.   Cardiovascular:  Negative for chest pain.  Gastrointestinal:  Negative for abdominal pain and nausea.  Genitourinary: Negative.   Musculoskeletal:  Negative for arthralgias, joint swelling and neck pain.  Skin: Negative.  Negative for rash and wound.  Neurological:  Negative for dizziness, weakness, light-headedness, numbness and headaches.  Psychiatric/Behavioral: Negative.    All other systems reviewed and are negative.   Physical Exam Updated Vital Signs BP (!) 169/61   Pulse (!) 46   Temp (!) 97.4 F (36.3 C) (Oral)   Resp 19   Ht 5\' 2"  (1.575 m)   Wt 54 kg   SpO2 99%   BMI 21.77 kg/m  Physical Exam Vitals and nursing note reviewed.  Constitutional:      Appearance: She is well-developed.  HENT:     Head: Normocephalic and atraumatic.     Right Ear: Tympanic membrane normal.     Left Ear: Tympanic membrane normal.  Eyes:     Extraocular Movements: Extraocular movements intact.     Conjunctiva/sclera: Conjunctivae normal.     Pupils:  Pupils are equal, round, and reactive to light.  Cardiovascular:     Rate and Rhythm: Normal rate and regular rhythm.     Heart sounds: Normal heart sounds.  Pulmonary:     Effort: Pulmonary effort is normal.     Breath sounds: Normal breath sounds. No wheezing.  Abdominal:     General: Bowel sounds are normal.     Palpations: Abdomen is soft.     Tenderness: There is no abdominal tenderness.  Musculoskeletal:        General: Normal range of motion.     Cervical back: Normal range of motion and neck supple.  Lymphadenopathy:     Cervical: No cervical adenopathy.  Skin:    General: Skin is warm and dry.     Findings: No rash.  Neurological:     General: No focal deficit present.     Mental Status: She is alert and oriented to person, place, and  time.     GCS: GCS eye subscore is 4. GCS verbal subscore is 5. GCS motor subscore is 6.     Cranial Nerves: No cranial nerve deficit.     Sensory: No sensory deficit.     Coordination: Coordination normal.     Gait: Gait normal.     Deep Tendon Reflexes: Reflexes normal.     Comments: No pronator drift. Cranial nerves III-XII intact.  No focal neurodeficits noted.  Patient is awake and alert, oriented x 3.  Husband at bedside concurs that she is at her baseline mentation.  Psychiatric:        Speech: Speech normal.        Behavior: Behavior normal.        Thought Content: Thought content normal.     ED Results / Procedures / Treatments   Labs (all labs ordered are listed, but only abnormal results are displayed) Labs Reviewed  COMPREHENSIVE METABOLIC PANEL - Abnormal; Notable for the following components:      Result Value   Sodium 131 (*)    Potassium 3.0 (*)    Chloride 97 (*)    Glucose, Bld 128 (*)    Creatinine, Ser 4.08 (*)    Calcium 7.9 (*)    Total Protein 5.4 (*)    Albumin 2.4 (*)    AST 9 (*)    GFR, Estimated 10 (*)    All other components within normal limits  CBC - Abnormal; Notable for the following  components:   RBC 2.63 (*)    Hemoglobin 8.8 (*)    HCT 28.1 (*)    MCV 106.8 (*)    RDW 24.8 (*)    Platelets 137 (*)    All other components within normal limits  CBG MONITORING, ED - Abnormal; Notable for the following components:   Glucose-Capillary 109 (*)    All other components within normal limits  POC OCCULT BLOOD, ED    EKG EKG Interpretation Date/Time:  Friday February 10 2023 14:05:18 EDT Ventricular Rate:  49 PR Interval:  206 QRS Duration:  138 QT Interval:  550 QTC Calculation: 497 R Axis:   18  Text Interpretation: Sinus bradycardia Atrial premature complex Left bundle branch block No significant change since prior 7/24 Confirmed by Meridee Score [phone]) on 02/10/2023 2:50:57 PM  Radiology CT Head Wo Contrast  Result Date: 02/10/2023 CLINICAL DATA:  Mental status change, unknown cause EXAM: CT HEAD WITHOUT CONTRAST TECHNIQUE: Contiguous axial images were obtained from the base of the skull through the vertex without intravenous contrast. RADIATION DOSE REDUCTION: This exam was performed according to the departmental dose-optimization program which includes automated exposure control, adjustment of the mA and/or kV according to patient size and/or use of iterative reconstruction technique. COMPARISON:  CT head 01/31/2023. FINDINGS: Brain: No evidence of acute large vascular territory infarction, hemorrhage, hydrocephalus, sizable extra-axial collection or mass lesion/mass effect. Patchy white matter hypodensities are nonspecific but compatible with chronic microvascular ischemic change. Vascular: No hyperdense vessel identified. Skull: No acute fracture. Sinuses/Orbits: Clear sinuses.  No acute orbital findings. Other: No mastoid effusions. IMPRESSION: No evidence of acute intracranial abnormality. Electronically Signed   By: Feliberto Harts M.D.   On: 02/10/2023 18:00   DG Chest Portable 1 View  Result Date: 02/10/2023 CLINICAL DATA:  Altered mental status. EXAM:  PORTABLE CHEST 1 VIEW COMPARISON:  Chest x-ray dated January 17, 2023. FINDINGS: Unchanged tunneled left internal jugular dialysis catheter. Stable cardiomegaly status post CABG. Chronic small left  pleural effusion and left basilar atelectasis. New mild subsegmental atelectasis at the right lung base. No pneumothorax. No acute osseous abnormality. Left brachial stent again noted. IMPRESSION: 1. Chronic small left pleural effusion and left basilar atelectasis. 2. New mild right basilar subsegmental atelectasis. Electronically Signed   By: Obie Dredge M.D.   On: 02/10/2023 17:30    Procedures Procedures  {Document cardiac monitor, telemetry assessment procedure when appropriate:1}  Medications Ordered in ED Medications - No data to display  ED Course/ Medical Decision Making/ A&P   {   Click here for ABCD2, HEART and other calculatorsREFRESH Note before signing :1}                              Medical Decision Making Patient presenting with an episode of profound fatigue during her dialysis session today, briefly was unable to respond to her surroundings although denies LOC.  Differential diagnosis including LOC, seizure, although she has had seizure-like activity in the past, there is no documentation that this was a seizure today, patient never lost consciousness.  Could also reflect electrolyte abnormality, CVA, TIA, dehydration.    Amount and/or Complexity of Data Reviewed Labs: ordered.    Details: Significant anemia with a hemoglobin of 8.8, therefore Hemoccult was completed and is negative.  She was admitted 2 weeks ago for blood transfusion at that point her hemoglobin was Radiology: ordered.     {Document critical care time when appropriate:1} {Document review of labs and clinical decision tools ie heart score, Chads2Vasc2 etc:1}  {Document your independent review of radiology images, and any outside records:1} {Document your discussion with family members, caretakers, and with  consultants:1} {Document social determinants of health affecting pt's care:1} {Document your decision making why or why not admission, treatments were needed:1} Final Clinical Impression(s) / ED Diagnoses Final diagnoses:  Fatigue, unspecified type  Chronic anemia    Rx / DC Orders ED Discharge Orders     None

## 2023-02-10 NOTE — ED Triage Notes (Signed)
Pt arrived [facility] from dialysis with c/o 'falling out' per pt. Pt received 1 hr of treatment. Pt able to answer all of nurse questions.

## 2023-02-21 ENCOUNTER — Ambulatory Visit: Payer: Medicare Other | Attending: Physician Assistant | Admitting: Physician Assistant

## 2023-02-21 ENCOUNTER — Encounter: Payer: Self-pay | Admitting: Physician Assistant

## 2023-02-21 VITALS — BP 150/80 | HR 58 | Wt 123.0 lb

## 2023-02-21 DIAGNOSIS — I480 Paroxysmal atrial fibrillation: Secondary | ICD-10-CM

## 2023-02-21 DIAGNOSIS — I10 Essential (primary) hypertension: Secondary | ICD-10-CM | POA: Diagnosis not present

## 2023-02-21 DIAGNOSIS — I2510 Atherosclerotic heart disease of native coronary artery without angina pectoris: Secondary | ICD-10-CM | POA: Diagnosis not present

## 2023-02-21 DIAGNOSIS — I5022 Chronic systolic (congestive) heart failure: Secondary | ICD-10-CM

## 2023-02-21 DIAGNOSIS — N186 End stage renal disease: Secondary | ICD-10-CM

## 2023-02-21 NOTE — Patient Instructions (Signed)
Medication Instructions:  Your physician recommends that you continue on your current medications as directed. Please refer to the Current Medication list given to you today.  *If you need a refill on your cardiac medications before your next appointment, please call your pharmacy*   Lab Work: NONE  If you have labs (blood work) drawn today and your tests are completely normal, you will receive your results only by: [place] (if you have MyChart) OR A paper copy in the mail If you have any lab test that is abnormal or we need to change your treatment, we will call you to review the results.   Testing/Procedures: NONE    Follow-Up: At [facility], you and your health needs are our priority.  As part of our continuing mission to provide you with exceptional heart care, we have created designated Provider Care Teams.  These Care Teams include your primary Cardiologist (physician) and Advanced Practice Providers (APPs -  Physician Assistants and Nurse Practitioners) who all work together to provide you with the care you need, when you need it.  We recommend signing up for the patient portal called "MyChart".  Sign up information is provided on this After Visit Summary.  MyChart is used to connect with patients for Virtual Visits (Telemedicine).  Patients are able to view lab/test results, encounter notes, upcoming appointments, etc.  Non-urgent messages can be sent to your provider as well.   To learn more about what you can do with MyChart, go to [email or web address].    Your next appointment:   6 month(s)  Provider:   You may see Carlyle Dolly, MD or one of the following Advanced Practice Providers on your designated Care Team:   Bernerd Pho, PA-C  Ermalinda Barrios, [place]     Other Instructions Thank you for choosing [facility]!

## 2023-02-28 ENCOUNTER — Telehealth (INDEPENDENT_AMBULATORY_CARE_PROVIDER_SITE_OTHER): Payer: Self-pay

## 2023-02-28 ENCOUNTER — Other Ambulatory Visit (INDEPENDENT_AMBULATORY_CARE_PROVIDER_SITE_OTHER): Payer: Self-pay

## 2023-02-28 NOTE — Telephone Encounter (Signed)
Patient husband called today stating he wanted to check on the "shot". I called [facility] and spoke with Albin Felling whom says Dawn the Pharmacist there has ordered the medication and it is supposed to be delivered to their office, once they receive the medication they will call the patient in to give injection. I called the patient husband and made him aware. He states understanding.

## 2023-02-28 NOTE — Telephone Encounter (Signed)
Thanks

## 2023-03-06 ENCOUNTER — Telehealth (INDEPENDENT_AMBULATORY_CARE_PROVIDER_SITE_OTHER): Payer: Self-pay

## 2023-03-06 NOTE — Telephone Encounter (Signed)
Albin Felling from [facility] states they have received the Sandostatin and they will administer the shot this week.

## 2023-03-06 NOTE — Telephone Encounter (Signed)
Excellent, thanks for the update ?

## 2023-03-08 ENCOUNTER — Observation Stay (HOSPITAL_COMMUNITY)
Admission: EM | Admit: 2023-03-08 | Discharge: 2023-03-10 | Disposition: A | Discharge status: Home Health | Payer: Medicare Other | Attending: Family Medicine | Admitting: Family Medicine

## 2023-03-08 ENCOUNTER — Other Ambulatory Visit: Payer: Self-pay

## 2023-03-08 ENCOUNTER — Encounter (HOSPITAL_COMMUNITY): Payer: Self-pay | Admitting: Emergency Medicine

## 2023-03-08 DIAGNOSIS — Z955 Presence of coronary angioplasty implant and graft: Secondary | ICD-10-CM | POA: Diagnosis not present

## 2023-03-08 DIAGNOSIS — Z992 Dependence on renal dialysis: Secondary | ICD-10-CM

## 2023-03-08 DIAGNOSIS — N186 End stage renal disease: Secondary | ICD-10-CM | POA: Insufficient documentation

## 2023-03-08 DIAGNOSIS — D500 Iron deficiency anemia secondary to blood loss (chronic): Secondary | ICD-10-CM | POA: Diagnosis not present

## 2023-03-08 DIAGNOSIS — D631 Anemia in chronic kidney disease: Secondary | ICD-10-CM | POA: Diagnosis present

## 2023-03-08 DIAGNOSIS — Z951 Presence of aortocoronary bypass graft: Secondary | ICD-10-CM | POA: Diagnosis not present

## 2023-03-08 DIAGNOSIS — K31819 Angiodysplasia of stomach and duodenum without bleeding: Secondary | ICD-10-CM | POA: Diagnosis present

## 2023-03-08 DIAGNOSIS — D62 Acute posthemorrhagic anemia: Secondary | ICD-10-CM | POA: Diagnosis present

## 2023-03-08 DIAGNOSIS — Z8673 Personal history of transient ischemic attack (TIA), and cerebral infarction without residual deficits: Secondary | ICD-10-CM | POA: Insufficient documentation

## 2023-03-08 DIAGNOSIS — I482 Chronic atrial fibrillation, unspecified: Secondary | ICD-10-CM | POA: Diagnosis not present

## 2023-03-08 DIAGNOSIS — R799 Abnormal finding of blood chemistry, unspecified: Secondary | ICD-10-CM | POA: Diagnosis present

## 2023-03-08 DIAGNOSIS — E876 Hypokalemia: Secondary | ICD-10-CM | POA: Diagnosis not present

## 2023-03-08 DIAGNOSIS — I5022 Chronic systolic (congestive) heart failure: Secondary | ICD-10-CM | POA: Insufficient documentation

## 2023-03-08 DIAGNOSIS — I2510 Atherosclerotic heart disease of native coronary artery without angina pectoris: Secondary | ICD-10-CM | POA: Diagnosis not present

## 2023-03-08 DIAGNOSIS — E039 Hypothyroidism, unspecified: Secondary | ICD-10-CM | POA: Diagnosis present

## 2023-03-08 DIAGNOSIS — D649 Anemia, unspecified: Principal | ICD-10-CM | POA: Diagnosis present

## 2023-03-08 DIAGNOSIS — N189 Chronic kidney disease, unspecified: Secondary | ICD-10-CM | POA: Diagnosis present

## 2023-03-08 DIAGNOSIS — E1122 Type 2 diabetes mellitus with diabetic chronic kidney disease: Secondary | ICD-10-CM | POA: Insufficient documentation

## 2023-03-08 DIAGNOSIS — Z79899 Other long term (current) drug therapy: Secondary | ICD-10-CM | POA: Insufficient documentation

## 2023-03-08 DIAGNOSIS — D638 Anemia in other chronic diseases classified elsewhere: Secondary | ICD-10-CM | POA: Diagnosis present

## 2023-03-08 LAB — COMPREHENSIVE METABOLIC PANEL
ALT: 8 U/L (ref 0–44)
AST: 8 U/L — ABNORMAL LOW (ref 15–41)
Albumin: 2.7 g/dL — ABNORMAL LOW (ref 3.5–5.0)
Alkaline Phosphatase: 71 U/L (ref 38–126)
Anion gap: 11 (ref 5–15)
BUN: 35 mg/dL — ABNORMAL HIGH (ref 8–23)
CO2: 27 mmol/L (ref 22–32)
Calcium: 8.7 mg/dL — ABNORMAL LOW (ref 8.9–10.3)
Chloride: 99 mmol/L (ref 98–111)
Creatinine, Ser: 6.03 mg/dL — ABNORMAL HIGH (ref 0.44–1.00)
GFR, Estimated: 7 mL/min — ABNORMAL LOW (ref >60)
Glucose, Bld: 108 mg/dL — ABNORMAL HIGH (ref 70–99)
Potassium: 3.5 mmol/L (ref 3.5–5.1)
Sodium: 137 mmol/L (ref 135–145)
Total Bilirubin: 0.8 mg/dL (ref 0.3–1.2)
Total Protein: 5.7 g/dL — ABNORMAL LOW (ref 6.5–8.1)

## 2023-03-08 LAB — CBC WITH DIFFERENTIAL/PLATELET
Abs Immature Granulocytes: 0.03 10*3/uL (ref 0.00–0.07)
Basophils Absolute: 0.1 10*3/uL (ref 0.0–0.1)
Basophils Relative: 2 %
Eosinophils Absolute: 0.2 10*3/uL (ref 0.0–0.5)
Eosinophils Relative: 4 %
HCT: 22.1 % — ABNORMAL LOW (ref 36.0–46.0)
Hemoglobin: 7 g/dL — ABNORMAL LOW (ref 12.0–15.0)
Immature Granulocytes: 1 %
Lymphocytes Relative: 11 %
Lymphs Abs: 0.6 10*3/uL — ABNORMAL LOW (ref 0.7–4.0)
MCH: 36.3 pg — ABNORMAL HIGH (ref 26.0–34.0)
MCHC: 31.7 g/dL (ref 30.0–36.0)
MCV: 114.5 fL — ABNORMAL HIGH (ref 80.0–100.0)
Monocytes Absolute: 0.5 10*3/uL (ref 0.1–1.0)
Monocytes Relative: 8 %
Neutro Abs: 4.1 10*3/uL (ref 1.7–7.7)
Neutrophils Relative %: 74 %
RBC: 1.93 MIL/uL — ABNORMAL LOW (ref 3.87–5.11)
WBC: 5.5 10*3/uL (ref 4.0–10.5)
nRBC: 0 % (ref 0.0–0.2)

## 2023-03-08 LAB — PREPARE RBC (CROSSMATCH)

## 2023-03-08 MED ORDER — FOLIC ACID 1 MG PO TABS
1.0000 mg | ORAL_TABLET | Freq: Every day | ORAL | Status: DC
  Administered 2023-03-09 – 2023-03-10 (×2): 1 mg via ORAL
  Filled 2023-03-08 (×3): qty 1

## 2023-03-08 MED ORDER — SODIUM CHLORIDE 0.9% IV SOLUTION: Freq: Once | INTRAVENOUS | Status: DC

## 2023-03-08 MED ORDER — OCTREOTIDE ACETATE 100 MCG/ML IJ SOLN
100.0000 ug | Freq: Two times a day (BID) | INTRAMUSCULAR | Status: DC
  Administered 2023-03-09 – 2023-03-10 (×3): 100 ug via INTRAVENOUS
  Filled 2023-03-08 (×11): qty 1

## 2023-03-08 MED ORDER — IRBESARTAN 75 MG PO TABS
37.5000 mg | ORAL_TABLET | Freq: Every day | ORAL | Status: DC
  Administered 2023-03-09 – 2023-03-10 (×2): 37.5 mg via ORAL
  Filled 2023-03-08 (×2): qty 1

## 2023-03-08 MED ORDER — AMIODARONE HCL 200 MG PO TABS
100.0000 mg | ORAL_TABLET | Freq: Every day | ORAL | Status: DC
  Administered 2023-03-09 – 2023-03-10 (×2): 100 mg via ORAL
  Filled 2023-03-08 (×3): qty 1

## 2023-03-08 MED ORDER — LEVOTHYROXINE SODIUM 75 MCG PO TABS
75.0000 ug | ORAL_TABLET | Freq: Every day | ORAL | Status: DC
  Administered 2023-03-09 – 2023-03-10 (×2): 75 ug via ORAL
  Filled 2023-03-08: qty 2
  Filled 2023-03-08: qty 1

## 2023-03-08 MED ORDER — PANTOPRAZOLE SODIUM 40 MG PO TBEC
40.0000 mg | DELAYED_RELEASE_TABLET | Freq: Every day | ORAL | Status: DC
  Administered 2023-03-08 – 2023-03-10 (×3): 40 mg via ORAL
  Filled 2023-03-08 (×3): qty 1

## 2023-03-08 MED ORDER — HYDRALAZINE HCL 25 MG PO TABS
25.0000 mg | ORAL_TABLET | Freq: Two times a day (BID) | ORAL | Status: DC
  Administered 2023-03-08 – 2023-03-10 (×4): 25 mg via ORAL
  Filled 2023-03-08 (×4): qty 1

## 2023-03-08 MED ORDER — BENZONATATE 100 MG PO CAPS: 100.0000 mg | ORAL_CAPSULE | Freq: Three times a day (TID) | ORAL | Status: DC | PRN

## 2023-03-08 MED ORDER — GABAPENTIN 100 MG PO CAPS
100.0000 mg | ORAL_CAPSULE | Freq: Every evening | ORAL | Status: DC
  Administered 2023-03-08 – 2023-03-09 (×2): 100 mg via ORAL
  Filled 2023-03-08 (×2): qty 1

## 2023-03-08 MED ORDER — HYDRALAZINE HCL 20 MG/ML IJ SOLN: 5.0000 mg | INTRAMUSCULAR | Status: DC | PRN

## 2023-03-08 MED ORDER — RENA-VITE PO TABS
1.0000 | ORAL_TABLET | Freq: Every day | ORAL | Status: DC
  Administered 2023-03-09 – 2023-03-10 (×2): 1 via ORAL
  Filled 2023-03-08 (×3): qty 1

## 2023-03-08 MED ORDER — CALCITRIOL 0.25 MCG PO CAPS
1.7500 ug | ORAL_CAPSULE | ORAL | Status: DC
  Filled 2023-03-08: qty 7

## 2023-03-08 MED ORDER — CYCLOBENZAPRINE HCL 10 MG PO TABS: 5.0000 mg | ORAL_TABLET | Freq: Three times a day (TID) | ORAL | Status: DC | PRN

## 2023-03-08 MED ORDER — ALPRAZOLAM 0.5 MG PO TABS: 0.5000 mg | ORAL_TABLET | Freq: Every day | ORAL | Status: DC | PRN

## 2023-03-08 MED ORDER — HYDROXYZINE HCL 25 MG PO TABS: 25.0000 mg | ORAL_TABLET | Freq: Four times a day (QID) | ORAL | Status: DC | PRN

## 2023-03-08 MED ORDER — CINACALCET HCL 30 MG PO TABS
30.0000 mg | ORAL_TABLET | ORAL | Status: DC
  Filled 2023-03-08 (×2): qty 1

## 2023-03-08 MED ORDER — NITROGLYCERIN 0.4 MG SL SUBL: 0.4000 mg | SUBLINGUAL_TABLET | SUBLINGUAL | Status: DC | PRN

## 2023-03-08 MED ORDER — CYANOCOBALAMIN 1000 MCG/ML IJ SOLN: 1000.0000 ug | INTRAMUSCULAR | Status: DC

## 2023-03-08 MED ORDER — GUAIFENESIN-CODEINE 100-10 MG/5ML PO SOLN: 5.0000 mL | ORAL | Status: DC | PRN

## 2023-03-08 MED ORDER — ROSUVASTATIN CALCIUM 20 MG PO TABS
10.0000 mg | ORAL_TABLET | Freq: Every day | ORAL | Status: DC
  Administered 2023-03-08 – 2023-03-09 (×2): 10 mg via ORAL
  Filled 2023-03-08 (×2): qty 1

## 2023-03-08 MED ORDER — LEVETIRACETAM 500 MG PO TABS
500.0000 mg | ORAL_TABLET | Freq: Every day | ORAL | Status: DC
  Administered 2023-03-09 – 2023-03-10 (×2): 500 mg via ORAL
  Filled 2023-03-08 (×3): qty 1

## 2023-03-08 MED ORDER — ONDANSETRON HCL 4 MG PO TABS: 4.0000 mg | ORAL_TABLET | Freq: Three times a day (TID) | ORAL | Status: DC | PRN

## 2023-03-08 MED ORDER — METOPROLOL TARTRATE 25 MG PO TABS
12.5000 mg | ORAL_TABLET | Freq: Two times a day (BID) | ORAL | Status: DC
  Administered 2023-03-09 – 2023-03-10 (×3): 12.5 mg via ORAL
  Filled 2023-03-08 (×4): qty 1

## 2023-03-08 MED ORDER — OCTREOTIDE ACETATE 20 MG IM KIT: 20.0000 mg | PACK | INTRAMUSCULAR | Status: DC

## 2023-03-08 NOTE — H&P (Addendum)
TRH H&P   Patient Demographics:    Shawna [name], is a 82 y.o. female  MRN: [id number]   DOB - [birth year]  Admit Date - 03/08/2023  Outpatient Primary MD for the patient is [facility]  Referring MD/NP/PA: Dr Rubin Payor  Patient coming from: home/HD center  Chief Complaint  Patient presents with   Abnormal Lab      HPI:    Shawna [name]  is a 82 y.o. female, with past medical history significant for hypertension, hyperlipidemia, CAD status post CABG and PCI, paroxysmal A-fib, no longer on anticoagulation due to recurrent GI bleed, ESRD on hemodialysis (MWF), seizure disorder, hypothyroidism.  Patient with recurrent admissions in the past secondary to acute on chronic blood loss anemia from recurrent AVM bleed, currently on IM octreotide as an outpatient. -Sent was sent for HD center, as routine lab prior to HD noted for low hemoglobin at 6.5, patient denies any chest pain, but she reports generalized weakness, fatigue, and mild dyspnea, she denies any bright red blood per rectum, she denies any melena, any nausea, and vomiting. -In ED repeat hemoglobin showing level of 7, potassium at 3.5, creatinine 6.03, BUN at 35, patient was ordered 1 unit PRBC transfusion overnight, plan for HD tomorrow by renal, with another PRBC transfusion during HD, [facility] hospitalist consulted to admit.   Review of systems:      A full 10 point Review of Systems was done, except as stated above, all other Review of Systems were negative.   With Past History of the following :    Past Medical History:  Diagnosis Date   Acute on chronic respiratory failure with hypoxia (HCC) 10/10/2016   Anxiety    Arthritis    AVM (arteriovenous malformation) of colon    CAD (coronary artery disease)    a. s/p CABG in 2013 b. DES to D1 in 10/2016. c. cath in 07/2018 showing patent grafts with occlusion of  D1 at prior stent site and progression of PDA disease --> medical management recommended   Carotid artery disease (HCC)    a. 60-79% LICA, 03/2012    Chronic bronchitis (HCC)    Chronic HFrEF (heart failure with reduced ejection fraction) (HCC)    Colon cancer (HCC) 1992   Esophageal stricture    ESRD on hemodialysis (HCC)    ESRD due to HTN, started dialysis 2011 and gets HD at [facility] with Dr Fausto Skillern on MWF schedule.  Access is LUA AVF as of Sept 2014.    GERD (gastroesophageal reflux disease)    High cholesterol 12/2011   History of blood transfusion 07/2011; 12/2011; 01/2012 X 2; 04/2012   History of gout    History of lower GI bleeding    Hypertension    Iron deficiency anemia    Jugular vein occlusion, right (HCC)    Mitral regurgitation    a. Moderate by echo, 02/2012  Mitral valve disease    NSVT (nonsustained ventricular tachycardia) (HCC)    Ovarian cancer (HCC) 1992   PAF (paroxysmal atrial fibrillation) (HCC)    Pneumonia ~ 2009   PUD (peptic ulcer disease)    TIA (transient ischemic attack)    Tricuspid valve disease       Past Surgical History:  Procedure Laterality Date   A/V FISTULAGRAM Left 10/04/2022   Procedure: A/V Fistulagram;  Surgeon: Renford Dills, MD;  Location: [facility] INVASIVE CV LAB;  Service: Cardiovascular;  Laterality: Left;   A/V SHUNTOGRAM Left 03/19/2019   Procedure: A/V SHUNTOGRAM;  Surgeon: Renford Dills, MD;  Location: [facility] INVASIVE CV LAB;  Service: Cardiovascular;  Laterality: Left;   ABDOMINAL HYSTERECTOMY  1992   APPENDECTOMY  06/1990   AV FISTULA PLACEMENT  07/2009   left upper arm   AV FISTULA PLACEMENT Right 09/06/2016   Procedure: RIGHT FOREARM ARTERIOVENOUS (AV) GRAFT;  Surgeon: Sherren Kerns, MD;  [facility]: [facility] OR;  Service: Vascular;  Laterality: Right;   AV FISTULA PLACEMENT N/A 02/24/2017   Procedure: INSERTION OF ARTERIOVENOUS (AV) GORE-TEX GRAFT ARM (BRACHIAL AXILLARY);  Surgeon: Renford Dills, MD;  Location: [facility]  ORS;  Service: Vascular;  Laterality: N/A;   AVGG REMOVAL Right 09/06/2016   Procedure: REMOVAL OF Right Arm ARTERIOVENOUS GORETEX GRAFT and Vein Patch angioplasty of brachial artery;  Surgeon: Chuck Hint, MD;  Location: [facility] OR;  Service: Vascular;  Laterality: Right;   BIOPSY  09/26/2019   Procedure: BIOPSY;  Surgeon: Malissa Hippo, MD;  [facility];  Service: Endoscopy;;   BIOPSY  11/01/2022   Procedure: BIOPSY;  Surgeon: Dolores Frame, MD;  [facility];  Service: Gastroenterology;;   COLON RESECTION  1992   COLON SURGERY     COLONOSCOPY N/A 03/09/2019   Procedure: COLONOSCOPY;  Surgeon: Malissa Hippo, MD;  [facility];  Service: Endoscopy;  Laterality: N/A;   COLONOSCOPY N/A 08/11/2022   Procedure: COLONOSCOPY;  Surgeon: Beverley Fiedler, MD;  Location: [facility] ENDOSCOPY;  Service: Gastroenterology;  Laterality: N/A;   COLONOSCOPY WITH PROPOFOL N/A 06/08/2021   Procedure: COLONOSCOPY WITH PROPOFOL;  Surgeon: Dolores Frame, MD;  [facility];  Service: Gastroenterology;  Laterality: N/A;  9:05 /Patient is on dialysis Mon Wed Fri   CORONARY ANGIOPLASTY WITH STENT PLACEMENT  12/15/11   "2"   CORONARY ANGIOPLASTY WITH STENT PLACEMENT  y/2013   "1; makes total of 3" (05/02/2012)   CORONARY ARTERY BYPASS GRAFT  06/13/2012   Procedure: CORONARY ARTERY BYPASS GRAFTING (CABG);  Surgeon: Delight Ovens, MD;  Location: [facility] OR;  Service: Open Heart Surgery;  Laterality: N/A;  cabg x four;  using left internal mammary artery, and left leg greater saphenous vein harvested endoscopically   CORONARY STENT INTERVENTION N/A 10/13/2016   Procedure: Coronary Stent Intervention;  Surgeon: Lennette Bihari, MD;  [facility] LAB;  Service: Cardiovascular;  Laterality: N/A;   DIALYSIS/PERMA CATHETER REMOVAL N/A 04/18/2017   Procedure: DIALYSIS/PERMA CATHETER REMOVAL;  Surgeon: Renford Dills, MD;  Location: [facility] INVASIVE CV LAB;   Service: Cardiovascular;  Laterality: N/A;   DILATION AND CURETTAGE OF UTERUS     ENTEROSCOPY N/A 06/08/2021   Procedure: PUSH ENTEROSCOPY;  Surgeon: Dolores Frame, MD;  [facility];  Service: Gastroenterology;  Laterality: N/A;   ENTEROSCOPY N/A 11/01/2022   Procedure: ENTEROSCOPY;  Surgeon: Dolores Frame, MD;  [facility];  Service:  Gastroenterology;  Laterality: N/A;   ENTEROSCOPY N/A 12/01/2022   Procedure: ENTEROSCOPY;  Surgeon: Benancio Deeds, MD;  Location: [facility] ENDOSCOPY;  Service: Gastroenterology;  Laterality: N/A;   ESOPHAGOGASTRODUODENOSCOPY  01/20/2012   Procedure: ESOPHAGOGASTRODUODENOSCOPY (EGD);  Surgeon: Meryl Dare, MD,FACG;  Location: [facility] ENDOSCOPY;  Service: Endoscopy;  Laterality: N/A;   ESOPHAGOGASTRODUODENOSCOPY N/A 03/26/2013   Procedure: ESOPHAGOGASTRODUODENOSCOPY (EGD);  Surgeon: Hilarie Fredrickson, MD;  Location: [facility] ENDOSCOPY;  Service: Endoscopy;  Laterality: N/A;   ESOPHAGOGASTRODUODENOSCOPY N/A 04/30/2015   Procedure: ESOPHAGOGASTRODUODENOSCOPY (EGD);  Surgeon: Malissa Hippo, MD;  [facility];  Service: Endoscopy;  Laterality: N/A;  1pm - moved to 10/20 @ 1:10   ESOPHAGOGASTRODUODENOSCOPY N/A 07/29/2016   Procedure: ESOPHAGOGASTRODUODENOSCOPY (EGD);  Surgeon: Ruffin Frederick, MD;  Location: [facility] ENDOSCOPY;  Service: Gastroenterology;  Laterality: N/A;  enteroscopy   ESOPHAGOGASTRODUODENOSCOPY N/A 09/26/2019   Procedure: ESOPHAGOGASTRODUODENOSCOPY (EGD);  Surgeon: Malissa Hippo, MD;  [facility];  Service: Endoscopy;  Laterality: N/A;  1250   ESOPHAGOGASTRODUODENOSCOPY N/A 08/11/2022   Procedure: ESOPHAGOGASTRODUODENOSCOPY (EGD);  Surgeon: Beverley Fiedler, MD;  Location[facility] ENDOSCOPY;  Service: Gastroenterology;  Laterality: N/A;   ESOPHAGOGASTRODUODENOSCOPY (EGD) WITH PROPOFOL N/A 02/05/2021   Procedure: ESOPHAGOGASTRODUODENOSCOPY (EGD) WITH PROPOFOL;  Surgeon: Lanelle Bal, DO;  [facility];  Service: Endoscopy;  Laterality: N/A;   ESOPHAGOGASTRODUODENOSCOPY (EGD) WITH PROPOFOL N/A 08/27/2022   Procedure: ESOPHAGOGASTRODUODENOSCOPY (EGD) WITH PROPOFOL;  Surgeon: Lanelle Bal, DO;  [facility];  Service: Endoscopy;  Laterality: N/A;   GIVENS CAPSULE STUDY N/A 03/07/2019   Procedure: GIVENS CAPSULE STUDY;  Surgeon: Malissa Hippo, MD;  [facility];  Service: Endoscopy;  Laterality: N/A;  7:30   GIVENS CAPSULE STUDY N/A 04/22/2021   Procedure: GIVENS CAPSULE STUDY;  Surgeon: Malissa Hippo, MD;  [facility];  Service: Endoscopy;  Laterality: N/A;  7:30   GIVENS CAPSULE STUDY N/A 08/27/2022   Procedure: GIVENS CAPSULE STUDY;  Surgeon: Lanelle Bal, DO;  [facility];  Service: Endoscopy;  Laterality: N/A;   HOT HEMOSTASIS N/A 08/11/2022   Procedure: HOT HEMOSTASIS (ARGON PLASMA COAGULATION/BICAP);  Surgeon: Beverley Fiedler, MD;  Location: [facility] ENDOSCOPY;  Service: Gastroenterology;  Laterality: N/A;   HOT HEMOSTASIS N/A 12/01/2022   Procedure: HOT HEMOSTASIS (ARGON PLASMA COAGULATION/BICAP);  Surgeon: Benancio Deeds, MD;  Location: [facility] ENDOSCOPY;  Service: Gastroenterology;  Laterality: N/A;   INSERTION OF DIALYSIS CATHETER N/A 10/05/2020   Procedure: ABORTED TUNNELED DIALYSIS CATHETER PLACEMENT RIGHT INTERNAL JUGULAR VEIN ;  Surgeon: Lucretia Roers, MD;  Location: [facility] ORS;  Service: General;  Laterality: N/A;   INTRAOPERATIVE TRANSESOPHAGEAL ECHOCARDIOGRAM  06/13/2012   Procedure: INTRAOPERATIVE TRANSESOPHAGEAL ECHOCARDIOGRAM;  Surgeon: Delight Ovens, MD;  Location[facility] OR;  Service: Open Heart Surgery;  Laterality: N/A;   IR DIALY SHUNT INTRO NEEDLE/INTRACATH INITIAL W/IMG LEFT Left 10/06/2020   IR FLUORO GUIDE CV LINE RIGHT  06/17/2020   IR FLUORO GUIDE CV LINE RIGHT  11/12/2022   IR GENERIC HISTORICAL  07/26/2016   IR FLUORO GUIDE CV LINE RIGHT 07/26/2016 Berdine Dance, MD [facility]   IR GENERIC HISTORICAL   07/26/2016   IR US GUIDE VASC ACCESS RIGHT 07/26/2016 Berdine Dance, MD [facility]   IR GENERIC HISTORICAL  08/02/2016   IR US GUIDE VASC ACCESS RIGHT 08/02/2016 Berdine Dance, MD [facility]   IR GENERIC HISTORICAL  08/02/2016   IR FLUORO GUIDE CV LINE RIGHT 08/02/2016 Berdine Dance,  MD [facility]   IR RADIOLOGY PERIPHERAL GUIDED IV START  03/28/2017   IR REMOVAL TUN CV CATH W/O FL  08/11/2020   IR REMOVAL TUN CV CATH W/O FL  11/15/2022   IR THROMBECTOMY AV FISTULA W/THROMBOLYSIS INC/SHUNT/IMG LEFT Left 06/17/2020   IR US GUIDE VASC ACCESS LEFT  06/17/2020   IR US GUIDE VASC ACCESS RIGHT  03/28/2017   IR US GUIDE VASC ACCESS RIGHT  06/17/2020   IR US GUIDE VASC ACCESS RIGHT  11/12/2022   LEFT HEART CATH AND CORONARY ANGIOGRAPHY N/A 09/20/2016   Procedure: Left Heart Cath and Coronary Angiography;  Surgeon: Lyn Records, MD;  Location: [facility] INVASIVE CV LAB;  Service: Cardiovascular;  Laterality: N/A;   LEFT HEART CATH AND CORS/GRAFTS ANGIOGRAPHY N/A 10/13/2016   Procedure: Left Heart Cath and Cors/Grafts Angiography;  Surgeon: Lennette Bihari, MD;  [facility]: [facility] INVAS[facility] CV LAB;  Service: Cardiovascular;  Laterality: N/A;   LEFT HEART CATH AND CORS/GRAFTS ANGIOGRAPHY N/A 07/13/2018   Procedure: LEFT HEART CATH AND CORS/GRAFTS ANGIOGRAPHY;  Surgeon: Swaziland, Peter M, MD;  Location: [facility] INVASIVE CV LAB;  Service: Cardiovascular;  Laterality: N/A;   LEFT HEART CATH AND CORS/GRAFTS ANGIOGRAPHY N/A 07/22/2021   Procedure: LEFT HEART CATH AND CORS/GRAFTS ANGIOGRAPHY;  Surgeon: Runell Gess, MD;  [facility]: [facility] INVASIVE CV LAB;  Service: Cardiovascular;  Laterality: N/A;   LEFT HEART CATHETERIZATION WITH CORONARY ANGIOGRAM N/A 12/15/2011   Procedure: LEFT HEART CATHETERIZATION WITH CORONARY ANGIOGRAM;  Surgeon: Kathleene Hazel, MD;  Location: [facility] CATH LAB;  Service: Cardiovascular;  Laterality: N/A;   LEFT HEART CATHETERIZATION WITH CORONARY ANGIOGRAM N/A 01/10/2012   Procedure: LEFT HEART CATHETERIZATION WITH  CORONARY ANGIOGRAM;  Surgeon: Peter M Swaziland, MD;  Location: [facility] CATH LAB;  Service: Cardiovascular;  Laterality: N/A;   LEFT HEART CATHETERIZATION WITH CORONARY ANGIOGRAM N/A 06/08/2012   Procedure: LEFT HEART CATHETERIZATION WITH CORONARY ANGIOGRAM;  Surgeon: Kathleene Hazel, MD;  Location: [facility] CATH LAB;  Service: Cardiovascular;  Laterality: N/A;   LEFT HEART CATHETERIZATION WITH CORONARY/GRAFT ANGIOGRAM N/A 12/10/2013   Procedure: LEFT HEART CATHETERIZATION WITH Isabel Caprice;  Surgeon: Corky Crafts, MD;  Location: [facility] CATH LAB;  Service: Cardiovascular;  Laterality: N/A;   OVARY SURGERY     ovarian cancer   POLYPECTOMY  03/09/2019   Procedure: POLYPECTOMY;  Surgeon: Malissa Hippo, MD;  [facility];  Service: Endoscopy;;  cecal    POLYPECTOMY N/A 09/26/2019   Procedure: DUODENAL POLYPECTOMY;  Surgeon: Malissa Hippo, MD;  [facility];  Service: Endoscopy;  Laterality: N/A;   POLYPECTOMY  08/11/2022   Procedure: POLYPECTOMY;  Surgeon: Beverley Fiedler, MD;  Location: [facility] ENDOSCOPY;  Service: Gastroenterology;;   REVISION OF ARTERIOVENOUS GORETEX GRAFT N/A 02/24/2017   Procedure: REVISION OF ARTERIOVENOUS GORETEX GRAFT (RESECTION);  Surgeon: Renford Dills, MD;  Location: [facility] ORS;  Service: Vascular;  Laterality: N/A;   REVISON OF ARTERIOVENOUS FISTULA Left 06/19/2020   Procedure: REVISION OF LEFT UPPER ARM AV GRAFT WITH INTERPOSITION JUMP GRAFT USING GORE LIMB;  Surgeon: Cephus Shelling, MD;  Location: [facility] OR;  Service: Vascular;  Laterality: Left;   SHUNTOGRAM N/A 10/15/2013   Procedure: Fistulogram;  Surgeon: Nada Libman, MD;  Location: [facility] CATH LAB;  Service: Cardiovascular;  Laterality: N/A;   SUBMUCOSAL TATTOO INJECTION  11/01/2022   Procedure: SUBMUCOSAL TATTOO INJECTION;  Surgeon: Dolores Frame, MD;  [facility];  Service: Gastroenterology;;   THROMBECTOMY / ARTERIOVENOUS GRAFT REVISION  2011   left upper  arm   TUBAL LIGATION  1980's   UPPER EXTREMITY ANGIOGRAPHY Bilateral 12/06/2016   Procedure: Upper Extremity Angiography;  Surgeon: Renford Dills, MD;  Location: [facility] INVASIVE CV LAB;  Service: Cardiovascular;  Laterality: Bilateral;   UPPER EXTREMITY INTERVENTION Left 06/06/2017   Procedure: UPPER EXTREMITY INTERVENTION;  Surgeon: Renford Dills, MD;  Location: [facility] INVASIVE CV LAB;  Service: Cardiovascular;  Laterality: Left;   UPPER EXTREMITY VENOGRAPHY Left 10/04/2022   Procedure: UPPER EXTREMITY VENOGRAPHY;  Surgeon: Renford Dills, MD;  Location: [facility] INVASIVE CV LAB;  Service: Cardiovascular;  Laterality: Left;      Social History:     Social History   Tobacco Use   Smoking status: Never   Smokeless tobacco: Never  Substance Use Topics   Alcohol use: No    Alcohol/week: 0.0 standard drinks of alcohol        Family History :     Family History  Problem Relation Age of Onset   Heart disease Mother        Heart Disease before age 8   Hyperlipidemia Mother    Hypertension Mother    Diabetes Mother    Heart attack Mother    Heart disease Father        Heart Disease before age 35   Hyperlipidemia Father    Hypertension Father    Diabetes Father    Diabetes Sister    Hypertension Sister    Diabetes Brother    Hyperlipidemia Brother    Heart attack Brother    Hypertension Sister    Heart attack Brother    Colon cancer Child 43   Other Other        noncontributory for early CAD   Esophageal cancer Neg Hx    Liver disease Neg Hx    Kidney disease Neg Hx    Colon polyps Neg Hx       Home Medications:   Prior to Admission medications   Medication Sig Start Date End Date Taking? Authorizing Provider  ALPRAZolam Prudy Feeler) 0.5 MG tablet Take 1 tablet (0.5 mg total) by mouth daily as needed for up to 4 doses for anxiety. 12/19/22  Yes Lanae Boast, MD  amiodarone (PACERONE) 100 MG tablet Take 1 tablet (100 mg total) by mouth daily. 02/02/23 03/08/23 Yes  Shah, Pratik D, DO  benzonatate (TESSALON) 100 MG capsule Take 1 capsule (100 mg total) by mouth 3 (three) times daily as needed for cough. 01/18/23  Yes Tilden Fossa, MD  calcitRIOL (ROCALTROL) 0.25 MCG capsule Take 7 capsules (1.75 mcg total) by mouth every Monday, Wednesday, and Friday with hemodialysis. 12/21/22  Yes Lanae Boast, MD  cinacalcet (SENSIPAR) 30 MG tablet Take 1 tablet (30 mg total) by mouth every Monday, Wednesday, and Friday with hemodialysis. 12/19/22  Yes Lanae Boast, MD  cyanocobalamin (VITAMIN B12) 1000 MCG/ML injection Inject 1,000 mcg into the muscle every 30 (thirty) days.   Yes [provider]  cyclobenzaprine (FLEXERIL) 5 MG tablet Take 5 mg by mouth 3 (three) times daily as needed.   Yes [provider]  folic acid (FOLVITE) 1 MG tablet Take 1 mg by mouth daily. 05/24/22  Yes [provider]  gabapentin (NEURONTIN) 100 MG capsule Take 1 capsule (100 mg total) by mouth every evening. 12/13/22  Yes Jerald Kief, MD  guaiFENesin-codeine 100-10 MG/5ML syrup Take 5 mLs by mouth every 4 (four) hours as needed for cough. 10/23/22  Yes Jacalyn Lefevre, MD  hydrALAZINE (APRESOLINE) 25 MG tablet Take 1 tablet (25 mg total) by mouth 2 (two) times daily. Hold for sbp <110 12/27/22  Yes Kc, Dayna Barker, MD  hydrOXYzine (ATARAX) 25 MG tablet Take 25 mg by mouth every 6 (six) hours as needed for itching. 06/06/22  Yes [provider]  levETIRAcetam (KEPPRA) 500 MG tablet Take 1 tablet (500 mg total) by mouth daily. 11/15/22  Yes Leroy Sea, MD  levothyroxine (SYNTHROID) 75 MCG tablet Take 1 tablet (75 mcg total) by mouth daily at 6 (six) AM. 02/02/23  Yes Sherryll Burger, Pratik D, DO  metoprolol tartrate (LOPRESSOR) 25 MG tablet Take 0.5 tablets (12.5 mg total) by mouth 2 (two) times daily. 02/01/23 03/08/23 Yes Shah, Pratik D, DO  multivitamin (RENA-VIT) TABS tablet Take 1 tablet by mouth daily.   Yes [provider]  nitroGLYCERIN (NITROSTAT) 0.4 MG SL  tablet Place 0.4 mg under the tongue every 5 (five) minutes as needed for chest pain.   Yes [provider]  olmesartan (BENICAR) 20 MG tablet Take 20 mg by mouth daily. 01/14/23  Yes [provider]  omeprazole (PRILOSEC) 20 MG capsule Take 1 capsule (20 mg total) by mouth 2 (two) times daily before a meal. Patient taking differently: Take 20 mg by mouth as needed (acid reflux). 02/01/23 03/08/23 Yes Shah, Pratik D, DO  ondansetron (ZOFRAN) 4 MG tablet Take 4 mg by mouth every 8 (eight) hours as needed. 02/27/23  Yes [provider]  rosuvastatin (CRESTOR) 10 MG tablet Take 1 tablet by mouth once daily 06/06/22  Yes Strader, [place] M, PA-C  octreotide (SANDOSTATIN LAR) 20 MG injection Inject 20 mg into the muscle every 28 (twenty-eight) days. Patient not taking: Reported on 03/08/2023 11/15/22   Leroy Sea, MD     Allergies:     Allergies  Allergen Reactions   Amlodipine Swelling   Aspirin Other (See Comments)    High Doses Mess up her stomach; "makes my bowels have blood in them". Takes 81 mg EC Aspirin    Nitrofurantoin Hives   Ranexa [Ranolazine] Other (See Comments)    Myoclonus-hospitalized    Bactrim [Sulfamethoxazole-Trimethoprim] Rash   Iodinated Contrast Media Itching and Other (See Comments)   Iron Itching and Other (See Comments)    "they gave me iron in dialysis; had to give me Benadryl cause I had to have the iron" (05/02/2012)   Tylenol [Acetaminophen] Itching and Other (See Comments)    Makes her feet on fire per pt - can tolerate per pt   Gabapentin Other (See Comments)    Unknown reaction   Sucroferric Oxyhydroxide Other (See Comments)    Unknown   Levaquin [Levofloxacin In D5w] Rash   Levofloxacin Rash and Other (See Comments)   Plavix [Clopidogrel Bisulfate] Rash   Protonix [Pantoprazole Sodium] Rash   Venofer [Ferric Oxide] Itching and Other (See Comments)    Patient reports using Benadryl prior to doses as [facility]      Physical Exam:   Vitals  Blood pressure (!) 147/44, pulse (!) 55, temperature 99 F (37.2 C), temperature source Oral, resp. rate 16, height 5\' 2"  (1.575 m), weight 56 kg, SpO2 98%.   1. General Frail female, laying in bed, in no apparent distress  2. Normal affect and insight, Not Suicidal or Homicidal, Awake Alert, Oriented X 3.  3. No F.N deficits, ALL C.Nerves Intact, Strength 5/5 all 4 extremities, Sensation intact all 4 extremities, Plantars down going.  4. Ears and Eyes appear  Normal, Conjunctivae clear, PERRLA. Moist Oral Mucosa.  5. Supple Neck, No JVD, No cervical lymphadenopathy appriciated, No Carotid Bruits.  6. Symmetrical Chest wall movement, Good air movement bilaterally, CTAB.  7. RRR, No Gallops, Rubs or Murmurs, No Parasternal Heave.  +1 edema  8. Positive Bowel Sounds, Abdomen Soft, No tenderness, No organomegaly appriciated,No rebound -guarding or rigidity.  9.  No Cyanosis, Normal Skin Turgor, No Skin Rash or Bruise.  10. Good muscle tone,  joints appear normal , no effusions, Normal ROM.    Data Review:    CBC Recent Labs  Lab 03/08/23 1435  WBC 5.5  HGB 7.0*  HCT 22.1*  PLT ACLMP  MCV 114.5*  MCH 36.3*  MCHC 31.7  RDW Not Measured  LYMPHSABS 0.6*  MONOABS 0.5  EOSABS 0.2  BASOSABS 0.1   ------------------------------------------------------------------------------------------------------------------  Chemistries  Recent Labs  Lab 03/08/23 1435  NA 137  K 3.5  CL 99  CO2 27  GLUCOSE 108*  BUN 35*  CREATININE 6.03*  CALCIUM 8.7*  AST 8*  ALT 8  ALKPHOS 71  BILITOT 0.8   ------------------------------------------------------------------------------------------------------------------ estimated creatinine clearance is 5.7 mL/min (A) (by C-G formula based on SCr of 6.03 mg/dL (H)). ------------------------------------------------------------------------------------------------------------------ No results for input(s): "TSH",  "T4TOTAL", "T3FREE", "THYROIDAB" in the last 72 hours.  Invalid input(s): "FREET3"  Coagulation profile No results for input(s): "INR", "PROTIME" in the last 168 hours. ------------------------------------------------------------------------------------------------------------------- No results for input(s): "DDIMER" in the last 72 hours. -------------------------------------------------------------------------------------------------------------------  Cardiac Enzymes No results for input(s): "CKMB", "TROPONINI", "MYOGLOBIN" in the last 168 hours.  Invalid input(s): "CK" ------------------------------------------------------------------------------------------------------------------    Component Value Date/Time   BNP 2,205.0 (H) 10/23/2022 1420     ---------------------------------------------------------------------------------------------------------------  Urinalysis    Component Value Date/Time   COLORURINE YELLOW 12/16/2012 1919   APPEARANCEUR CLOUDY (A) 12/16/2012 1919   LABSPEC 1.009 12/16/2012 1919   PHURINE 7.5 12/16/2012 1919   GLUCOSEU NEGATIVE 12/16/2012 1919   HGBUR TRACE (A) 12/16/2012 1919   BILIRUBINUR NEGATIVE 12/16/2012 1919   KETONESUR NEGATIVE 12/16/2012 1919   PROTEINUR 100 (A) 12/16/2012 1919   UROBILINOGEN 0.2 12/16/2012 1919   NITRITE NEGATIVE 12/16/2012 1919   LEUKOCYTESUR SMALL (A) 12/16/2012 1919    ----------------------------------------------------------------------------------------------------------------   Imaging Results:    No results found.    Assessment & Plan:    Active Problems:   Acute on chronic blood loss anemia   End-stage renal disease on hemodialysis (HCC)   Acquired hypothyroidism   CAD (coronary artery disease)   Anemia associated with chronic renal failure   Atrial fibrillation, chronic (HCC)   Angiodysplasia of duodenum   Anemia of chronic disease   Symptomatic anemia     Addendum 6:10 PM: -Patient  remains with no IV access despite multiple attempts ultrasound guided by ED staff, as well she is having antibodies, so it will be few hours before RBC is ready, so we will plan to transfuse both units PRBC during HD tomorrow, by then blood should be delivered from [place] and she can have the blood during her HD session  due to lack of IV access)  Symptomatic anemia Acute on chronic blood loss anemia secondary to known AVMs Anemia of chronic kidney disease -With known history of multifactorial anemia, she denies any history of significant GI bleed, she requires occasional PRBC transfusions. -She was sent by hemodialysis center for low hemoglobin, will provide supportive transfusion, will transfuse 2 units, will give 1 unit PRBC overnight, had another unit during HD tomorrow. -Will defer on Procrit  and IV iron until formal renal evaluation, as she is likely receiving them as an outpatient -SCD for DVT prophylaxis  History of AVM -Following with GI, currently on outpatient IM octreotide, I will keep her on octreotide during hospital stay  ESRD -Consulted, she missed HD today, likely HD in a.m., she is on room air, no evidence of volume overload or respiratory distress  Atrial tachycardia/paroxysmal atrial fibrillation/flutter/?WCT: -Has been seen by cardiology in the past, continue rhythm control medication with amiodarone and metoprolol -Not on anticoagulation secondary to recurrent GI bleed   Chronic systolic CHF with EF 30-35% CAD status post CABG with residual small vessel disease Chest pain due to demand ischemia: -Management with dialysis -Aspirin for anticoagulation secondary to recurrent GI bleed, she denies any chest pain or dyspnea   Uncontrolled Hypertension -Continue with home medications, will add as needed hydralazine   Hyperlipidemia: Continue with statin   Type diabetes mellitus  -This is diet controlled   Acquired hypothyroidism  Continue with home dose  Synthroid  DVT Prophylaxis  SCDs  AM Labs Ordered, also please review Full Orders  Family Communication: Admission, patients condition and plan of care including tests being ordered have been discussed with the patient and husband at bedside* who indicate understanding and agree with the plan and Code Status.  Code Status full code  Likely DC to home  Condition GUARDED  Consults called: Renal by ED  Admission status: Observation  Time spent in minutes : 70 minutes   Huey Bienenstock M.D on 03/08/2023 at 4:50 PM   [facility] - Office  [phone]

## 2023-03-08 NOTE — ED Notes (Signed)
Per Dr Randol Kern, will wait on IV due to difficulty stick and may do the blood infusions with dialysis tomorrow , 2 unsuccessful attempts by one RN, and one unsucc attempt by PA. Pt aware.

## 2023-03-08 NOTE — ED Notes (Signed)
Receive report from Drema Halon RN. Pt resting. Nad. Denies pain. Color wnl. Non diaphoretic. No gen weakness/shob noted. Pt in good spirits. Moving all extremities.

## 2023-03-08 NOTE — ED Triage Notes (Addendum)
Pt states she was sent by dialysis due to low hemoglobin. Pt is supposed to have dialysis today.

## 2023-03-08 NOTE — Progress Notes (Signed)
Notified that pt was sent from outpatient HD due to low Hgb of 7.  Ok to transfuse 1 unit over 2-3 hours and will plan for another unit tomorrow with HD as cannot accommodate due to census.  K is 3.5 and hemodynamically stable.

## 2023-03-08 NOTE — ED Notes (Addendum)
Per lab pt has antibody in cross match and will have to order blood unit and wait for it to get here prior to infusing. Pt is difficult stick. This RN attempted x 2 USIVG with no success. PA Jonny Ruiz will attempt.

## 2023-03-09 ENCOUNTER — Ambulatory Visit (INDEPENDENT_AMBULATORY_CARE_PROVIDER_SITE_OTHER): Payer: Medicare Other | Admitting: Gastroenterology

## 2023-03-09 DIAGNOSIS — D649 Anemia, unspecified: Secondary | ICD-10-CM | POA: Diagnosis not present

## 2023-03-09 DIAGNOSIS — D62 Acute posthemorrhagic anemia: Secondary | ICD-10-CM | POA: Diagnosis not present

## 2023-03-09 DIAGNOSIS — E039 Hypothyroidism, unspecified: Secondary | ICD-10-CM | POA: Diagnosis not present

## 2023-03-09 DIAGNOSIS — N186 End stage renal disease: Secondary | ICD-10-CM | POA: Diagnosis not present

## 2023-03-09 LAB — TROPONIN I (HIGH SENSITIVITY)
Troponin I (High Sensitivity): 38 ng/L — ABNORMAL HIGH (ref <18)
Troponin I (High Sensitivity): 32 ng/L — ABNORMAL HIGH (ref <18)

## 2023-03-09 LAB — CBC
HCT: 22.5 % — ABNORMAL LOW (ref 36.0–46.0)
Hemoglobin: 7.2 g/dL — ABNORMAL LOW (ref 12.0–15.0)
MCH: 36.7 pg — ABNORMAL HIGH (ref 26.0–34.0)
MCHC: 32 g/dL (ref 30.0–36.0)
MCV: 114.8 fL — ABNORMAL HIGH (ref 80.0–100.0)
Platelets: 255 10*3/uL (ref 150–400)
RBC: 1.96 MIL/uL — ABNORMAL LOW (ref 3.87–5.11)
WBC: 9.4 10*3/uL (ref 4.0–10.5)
nRBC: 0 % (ref 0.0–0.2)

## 2023-03-09 LAB — BASIC METABOLIC PANEL
Anion gap: 14 (ref 5–15)
BUN: 38 mg/dL — ABNORMAL HIGH (ref 8–23)
CO2: 24 mmol/L (ref 22–32)
Calcium: 8.7 mg/dL — ABNORMAL LOW (ref 8.9–10.3)
Chloride: 99 mmol/L (ref 98–111)
Creatinine, Ser: 6.31 mg/dL — ABNORMAL HIGH (ref 0.44–1.00)
GFR, Estimated: 6 mL/min — ABNORMAL LOW (ref >60)
Glucose, Bld: 135 mg/dL — ABNORMAL HIGH (ref 70–99)
Potassium: 4.2 mmol/L (ref 3.5–5.1)
Sodium: 137 mmol/L (ref 135–145)

## 2023-03-09 LAB — HEPATITIS B SURFACE ANTIGEN: Hepatitis B Surface Ag: NONREACTIVE

## 2023-03-09 LAB — PREPARE RBC (CROSSMATCH)

## 2023-03-09 MED ORDER — HEPARIN SODIUM (PORCINE) 1000 UNIT/ML DIALYSIS
1000.0000 [IU] | INTRAMUSCULAR | Status: DC | PRN
  Administered 2023-03-09 – 2023-03-10 (×2): 3800 [IU]

## 2023-03-09 MED ORDER — HEPARIN SODIUM (PORCINE) 1000 UNIT/ML IJ SOLN
INTRAMUSCULAR | Status: AC
  Filled 2023-03-09: qty 4

## 2023-03-09 MED ORDER — ACETAMINOPHEN 325 MG PO TABS
650.0000 mg | ORAL_TABLET | Freq: Four times a day (QID) | ORAL | Status: DC | PRN
  Administered 2023-03-09: 650 mg via ORAL
  Filled 2023-03-09: qty 2

## 2023-03-09 MED ORDER — ALTEPLASE 2 MG IJ SOLR: 2.0000 mg | Freq: Once | INTRAMUSCULAR | Status: DC | PRN

## 2023-03-09 MED ORDER — SODIUM CHLORIDE 0.9% IV SOLUTION: Freq: Once | INTRAVENOUS | Status: AC

## 2023-03-09 MED ORDER — CHLORHEXIDINE GLUCONATE CLOTH 2 % EX PADS
6.0000 | MEDICATED_PAD | Freq: Every day | CUTANEOUS | Status: DC
  Administered 2023-03-09 – 2023-03-10 (×2): 6 via TOPICAL

## 2023-03-09 NOTE — ED Notes (Signed)
Lab confused on how many pints pt needs. Attending aware and waiting on response.

## 2023-03-09 NOTE — ED Notes (Signed)
Attending aware IV access is not established. Dialysis nurse to give blood. Attending aware pt bp has decreased and no chest pain anymore, Attending stated pt does not need to go to ICU.

## 2023-03-09 NOTE — TOC Initial Note (Signed)
Transition of Care [facility]) - Initial/Assessment Note    Patient Details  Name: Shawna [name] MRN: [id number] Date of Birth: 1941/01/06  Transition of [facility]) CM/SW Contact:    Elliot Gault, LCSW Phone Number: 03/09/2023, 9:56 AM  Clinical Narrative:                  Pt admitted from home. Pt well known to TOC from previous admissions. Pt resides in [place], [place]. She is active with [facility]. RN Darl Pikes at [facility] states pt was active with them prior to admission and they will need resumption orders faxed to them at dc along with the dc summary.    Susan's phone number is [phone] and fax is [phone].  TOC will follow.  Expected Discharge Plan: Home w Home Health Services Barriers to Discharge: Continued Medical Work up   Patient Goals and CMS Choice Patient states their goals for this hospitalization and ongoing recovery are:: go home          Expected Discharge Plan and Services In-house Referral: Clinical Social Work   Post Acute Care Choice: Resumption of Svcs/PTA Provider Living arrangements for the past 2 months: Single Family Home                                      Prior Living Arrangements/Services Living arrangements for the past 2 months: Single Family Home Lives with:: Self Patient language and need for interpreter reviewed:: Yes        Need for Family Participation in Patient Care: Yes (Comment) Care giver support system in place?: Yes (comment) Current home services: DME, Home PT, Home RN Criminal Activity/Legal Involvement Pertinent to Current Situation/Hospitalization: No - Comment as needed  Activities of Daily Living      Permission Sought/Granted                  Emotional Assessment Appearance:: Appears stated age     Orientation: : Oriented to Self, Oriented to Place, Oriented to  Time, Oriented to Situation Alcohol / Substance Use: Not Applicable Psych Involvement: No (comment)  Admission diagnosis:   Symptomatic anemia [D64.9] Patient Active Problem List   Diagnosis Date Noted   Symptomatic anemia 03/08/2023   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   Acute blood loss anemia 01/27/2023   Paroxysmal atrial flutter (HCC) 12/14/2022   Pressure injury of skin 12/01/2022   Rectal bleeding 11/30/2022   Generalized abdominal pain 11/10/2022   Ileus (HCC) 11/04/2022   Periumbilical abdominal pain 11/04/2022   Macrocytic anemia 10/29/2022   Anemia of chronic disease 10/29/2022   Upper GI bleed 10/29/2022   Iron deficiency anemia due to chronic blood loss 08/26/2022   Benign neoplasm of transverse colon 08/11/2022   Benign neoplasm of descending colon 08/11/2022   Benign neoplasm of sigmoid colon 08/11/2022   Angiodysplasia of duodenum 08/11/2022   Delirium 04/22/2022   History of arteriovenous malformation (AVM) 04/22/2022   Acute metabolic encephalopathy 11/09/2021   NSTEMI, initial episode of care (HCC) 10/19/2021   Acute on chronic blood loss anemia 09/09/2021   Ischemic cardiomyopathy    Atrial fibrillation, chronic (HCC) 05/07/2021   Neuropathy 04/23/2021   Anemia associated with chronic renal failure 04/13/2021   Left knee pain 03/15/2021   Moderate protein-calorie malnutrition (HCC) 02/27/2021   Hypokalemia 02/27/2021   COVID-19 virus infection 02/03/2021   Leukocytosis 02/03/2021   Acquired hypothyroidism 02/03/2021  Myositis 12/03/2020   Ataxia 12/02/2020   Diabetes mellitus type 2 in nonobese (HCC) 11/10/2020   Failure to thrive in adult 11/09/2020   Dialysis AV fistula malfunction, initial encounter (HCC)    Jugular vein occlusion, right (HCC)    Failure of surgically constructed arteriovenous fistula (HCC) 10/03/2020   Myoclonus 08/31/2020   Clotted renal dialysis AV graft, initial encounter (HCC)    Hemodialysis-associated hypotension    Irritable bowel syndrome 02/25/2020   Adenomatous duodenal polyp 09/10/2019   History of GI bleed 09/10/2019    Hand steal syndrome (HCC) 08/01/2017   CAD (coronary artery disease) 06/05/2017   Intestinal ischemia (HCC)    Complication of vascular access for dialysis 03/19/2017   Preoperative clearance 01/25/2017   H/O non-ST elevation myocardial infarction (NSTEMI) 10/24/2016   Non-ST elevation (NSTEMI) myocardial infarction [facility])    Heme positive stool    Cardiac arrest [facility])    Palliative care encounter    Goals of care, counseling/discussion    Flash pulmonary edema (HCC) 04/06/2016   History of colon cancer 01/27/2016   History of ovarian cancer 01/27/2016   PAF (paroxysmal atrial fibrillation) (HCC) 10/14/2015   Malignant neoplasm of right ovary (HCC) 10/14/2015   Wide-complex tachycardia 09/08/2015   SVT (supraventricular tachycardia) 09/08/2015   Essential hypertension    Dyspnea    Constipation 03/12/2013   Abdominal wall pain in left flank 03/12/2013   Occlusion and stenosis of carotid artery without mention of cerebral infarction 01/24/2013   Hx of CABG 07/05/2012   Carotid artery disease (HCC) 07/05/2012   Mitral regurgitation 06/12/2012   Non-STEMI (non-ST elevated myocardial infarction) (HCC) 06/08/2012   Chronic combined systolic and diastolic CHF (congestive heart failure) (HCC) 05/02/2012   AVM (arteriovenous malformation) of small bowel, 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 [place] [address] Phone: [phone] Fax: [phone]  [facility] (ESRD Bundle Only) - [place], [place] - [address] [place] [address] Phone: [phone] Fax:  [phone]  [facility] - [address] [place], [place] - [address] [place] [address] Phone: [phone] Fax: [phone]  [facility] [address] [place] [address] Phone: [phone] Fax: [phone]     Social Determinants of Health (SDOH) Social History: SDOH Screenings   Food Insecurity: No Food Insecurity (01/27/2023)  Housing: Low Risk  (01/27/2023)  Transportation Needs: No Transportation Needs (02/04/2023)   Received from [facility]  Utilities: Not At Risk (01/27/2023)  Financial Resource Strain: Low Risk  (12/14/2018)  Physical Activity: Unknown (12/14/2018)  Social Connections: Unknown (12/14/2018)  Stress: No Stress Concern Present (12/14/2018)  Tobacco Use: Low Risk  (03/08/2023)  Health Literacy: Inadequate Health Literacy (02/04/2023)   Received from [facility]   SDOH Interventions:     Readmission Risk Interventions    01/29/2023   11:08 AM 10/31/2022   12:45 PM 08/29/2022    4:47 PM  Readmission Risk Prevention Plan  Transportation Screening Complete Complete Complete  Medication Review (RN Care Manager) Complete Complete Complete  PCP or Specialist appointment within 3-5 days of discharge Complete  Complete  HRI or Home Care Consult Complete Complete Complete  SW Recovery Care/Counseling Consult Complete Complete Complete  Palliative Care Screening Not Applicable Not Applicable Not Applicable  Skilled Nursing Facility Not Applicable Not Applicable Complete

## 2023-03-09 NOTE — ED Notes (Signed)
Dr. Sharl Ma came to this RN and reported that pt is having sudden onset of chest pain and we needed to keep her in the ED instead of transferring her to unit 300 because she needs a Troponin blood level drawn and an EKG. She may possibly need to be moved to a different unit after Troponin and EKG are done. Pt reports 10/10 chest pain to left side of chest that started about 5 minutes ago. Pt unable to describe what the pain feels like.

## 2023-03-09 NOTE — ED Notes (Signed)
ED TO INPATIENT HANDOFF REPORT  ED Nurse Name and Phone #: Iona Coach [phone]  S Name/Age/Gender Shawna [name] 82 y.o. female Room/Bed: APA04/APA04  Code Status   Code Status: Full Code  Home/SNF/Other Home Patient oriented to: self, place, time, and situation Is this baseline? Yes   Triage Complete: Triage complete  Chief Complaint Symptomatic anemia [D64.9]  Triage Note Pt states she was sent by dialysis due to low hemoglobin. Pt is supposed to have dialysis today.   Allergies Allergies  Allergen Reactions   Amlodipine Swelling   Aspirin Other (See Comments)    High Doses Mess up her stomach; "makes my bowels have blood in them". Takes 81 mg EC Aspirin    Nitrofurantoin Hives   Ranexa [Ranolazine] Other (See Comments)    Myoclonus-hospitalized    Bactrim [Sulfamethoxazole-Trimethoprim] Rash   Iodinated Contrast Media Itching and Other (See Comments)   Iron Itching and Other (See Comments)    "they gave me iron in dialysis; had to give me Benadryl cause I had to have the iron" (05/02/2012)   Tylenol [Acetaminophen] Itching and Other (See Comments)    Makes her feet on fire per pt - can tolerate per pt   Gabapentin Other (See Comments)    Unknown reaction   Sucroferric Oxyhydroxide Other (See Comments)    Unknown   Levaquin [Levofloxacin In D5w] Rash   Levofloxacin Rash and Other (See Comments)   Plavix [Clopidogrel Bisulfate] Rash   Protonix [Pantoprazole Sodium] Rash   Venofer [Ferric Oxide] Itching and Other (See Comments)    Patient reports using Benadryl prior to doses as [facility]    Level of Care/Admitting Diagnosis ED Disposition     ED Disposition  Admit   Condition  --   Comment  Hospital Area: [facility] [[id number]]  Level of Care: Telemetry [5]  Covid Evaluation: Asymptomatic - no recent exposure (last 10 days) testing not required  Diagnosis: Symptomatic anemia [[id number]]  Admitting Physician: Chiquita Loth   Attending Physician: Randol Kern, DAWOOD S [[id number]]          B Medical/Surgery History Past Medical History:  Diagnosis Date   Acute on chronic respiratory failure with hypoxia (HCC) 10/10/2016   Anxiety    Arthritis    AVM (arteriovenous malformation) of colon    CAD (coronary artery disease)    a. s/p CABG in 2013 b. DES to D1 in 10/2016. c. cath in 07/2018 showing patent grafts with occlusion of D1 at prior stent site and progression of PDA disease --> medical management recommended   Carotid artery disease (HCC)    a. 60-79% LICA, 03/2012    Chronic bronchitis (HCC)    Chronic HFrEF (heart failure with reduced ejection fraction) (HCC)    Colon cancer (HCC) 1992   Esophageal stricture    ESRD on hemodialysis (HCC)    ESRD due to HTN, started dialysis 2011 and gets HD at [facility] with Dr Fausto Skillern on MWF schedule.  Access is LUA AVF as of Sept 2014.    GERD (gastroesophageal reflux disease)    High cholesterol 12/2011   History of blood transfusion 07/2011; 12/2011; 01/2012 X 2; 04/2012   History of gout    History of lower GI bleeding    Hypertension    Iron deficiency anemia    Jugular vein occlusion, right (HCC)    Mitral regurgitation    a. Moderate by echo, 02/2012   Mitral valve disease    NSVT (nonsustained  ventricular tachycardia) (HCC)    Ovarian cancer (HCC) 1992   PAF (paroxysmal atrial fibrillation) (HCC)    Pneumonia ~ 2009   PUD (peptic ulcer disease)    TIA (transient ischemic attack)    Tricuspid valve disease    Past Surgical History:  Procedure Laterality Date   A/V FISTULAGRAM Left 10/04/2022   Procedure: A/V Fistulagram;  Surgeon: Renford Dills, MD;  Location: [facility] INVASIVE CV LAB;  Service: Cardiovascular;  Laterality: Left;   A/V SHUNTOGRAM Left 03/19/2019   Procedure: A/V SHUNTOGRAM;  Surgeon: Renford Dills, MD;  Location: [facility] INVASIVE CV LAB;  Service: Cardiovascular;  Laterality: Left;   ABDOMINAL HYSTERECTOMY  1992   APPENDECTOMY   06/1990   AV FISTULA PLACEMENT  07/2009   left upper arm   AV FISTULA PLACEMENT Right 09/06/2016   Procedure: RIGHT FOREARM ARTERIOVENOUS (AV) GRAFT;  Surgeon: Sherren Kerns, MD;  [facility];  Service: Vascular;  Laterality: Right;   AV FISTULA PLACEMENT N/A 02/24/2017   Procedure: INSERTION OF ARTERIOVENOUS (AV) GORE-TEX GRAFT ARM (BRACHIAL AXILLARY);  Surgeon: Renford Dills, MD;  Location: [facility] ORS;  Service: Vascular;  Laterality: N/A;   AVGG REMOVAL Right 09/06/2016   Procedure: REMOVAL OF Right Arm ARTERIOVENOUS GORETEX GRAFT and Vein Patch angioplasty of brachial artery;  Surgeon: Chuck Hint, MD;  Location: [facility] OR;  Service: Vascular;  Laterality: Right;   BIOPSY  09/26/2019   Procedure: BIOPSY;  Surgeon: Malissa Hippo, MD;  [facility];  Service: Endoscopy;;   BIOPSY  11/01/2022   Procedure: BIOPSY;  Surgeon: Dolores Frame, MD;  [facility];  Service: Gastroenterology;;   COLON RESECTION  1992   COLON SURGERY     COLONOSCOPY N/A 03/09/2019   Procedure: COLONOSCOPY;  Surgeon: Malissa Hippo, MD;  [facility];  Service: Endoscopy;  Laterality: N/A;   COLONOSCOPY N/A 08/11/2022   Procedure: COLONOSCOPY;  Surgeon: Beverley Fiedler, MD;  Location: [facility] ENDOSCOPY;  Service: Gastroenterology;  Laterality: N/A;   COLONOSCOPY WITH PROPOFOL N/A 06/08/2021   Procedure: COLONOSCOPY WITH PROPOFOL;  Surgeon: Dolores Frame, MD;  [facility];  Service: Gastroenterology;  Laterality: N/A;  9:05 /Patient is on dialysis Mon Wed Fri   CORONARY ANGIOPLASTY WITH STENT PLACEMENT  12/15/11   "2"   CORONARY ANGIOPLASTY WITH STENT PLACEMENT  y/2013   "1; makes total of 3" (05/02/2012)   CORONARY ARTERY BYPASS GRAFT  06/13/2012   Procedure: CORONARY ARTERY BYPASS GRAFTING (CABG);  Surgeon: Delight Ovens, MD;  Location: [facility] OR;  Service: Open Heart Surgery;  Laterality: N/A;  cabg x four;  using left internal mammary artery,  and left leg greater saphenous vein harvested endoscopically   CORONARY STENT INTERVENTION N/A 10/13/2016   Procedure: Coronary Stent Intervention;  Surgeon: Lennette Bihari, MD;  [facility];  Service: Cardiovascular;  Laterality: N/A;   DIALYSIS/PERMA CATHETER REMOVAL N/A 04/18/2017   Procedure: DIALYSIS/PERMA CATHETER REMOVAL;  Surgeon: Renford Dills, MD;  Location: [facility] INVASIVE CV LAB;  Service: Cardiovascular;  Laterality: N/A;   DILATION AND CURETTAGE OF UTERUS     ENTEROSCOPY N/A 06/08/2021   Procedure: PUSH ENTEROSCOPY;  Surgeon: Dolores Frame, MD;  [facility];  Service: Gastroenterology;  Laterality: N/A;   ENTEROSCOPY N/A 11/01/2022   Procedure: ENTEROSCOPY;  Surgeon: Dolores Frame, MD;  [facility];  Service: Gastroenterology;  Laterality: N/A;   ENTEROSCOPY N/A 12/01/2022  Procedure: ENTEROSCOPY;  Surgeon: Benancio Deeds, MD;  Location: [facility] ENDOSCOPY;  Service: Gastroenterology;  Laterality: N/A;   ESOPHAGOGASTRODUODENOSCOPY  01/20/2012   Procedure: ESOPHAGOGASTRODUODENOSCOPY (EGD);  Surgeon: Meryl Dare, MD,FACG;  Location: [facility] ENDOSCOPY;  Service: Endoscopy;  Laterality: N/A;   ESOPHAGOGASTRODUODENOSCOPY N/A 03/26/2013   Procedure: ESOPHAGOGASTRODUODENOSCOPY (EGD);  Surgeon: Hilarie Fredrickson, MD;  Location: [facility] ENDOSCOPY;  Service: Endoscopy;  Laterality: N/A;   ESOPHAGOGASTRODUODENOSCOPY N/A 04/30/2015   Procedure: ESOPHAGOGASTRODUODENOSCOPY (EGD);  Surgeon: Malissa Hippo, MD;  [facility];  Service: Endoscopy;  Laterality: N/A;  1pm - moved to 10/20 @ 1:10   ESOPHAGOGASTRODUODENOSCOPY N/A 07/29/2016   Procedure: ESOPHAGOGASTRODUODENOSCOPY (EGD);  Surgeon: Ruffin Frederick, MD;  Location: [facility] ENDOSCOPY;  Service: Gastroenterology;  Laterality: N/A;  enteroscopy   ESOPHAGOGASTRODUODENOSCOPY N/A 09/26/2019   Procedure: ESOPHAGOGASTRODUODENOSCOPY (EGD);  Surgeon: Malissa Hippo, MD;  [facility];  Service: Endoscopy;  Laterality: N/A;  1250   ESOPHAGOGASTRODUODENOSCOPY N/A 08/11/2022   Procedure: ESOPHAGOGASTRODUODENOSCOPY (EGD);  Surgeon: Beverley Fiedler, MD;  Location: [facility] ENDOSCOPY;  Service: Gastroenterology;  Laterality: N/A;   ESOPHAGOGASTRODUODENOSCOPY (EGD) WITH PROPOFOL N/A 02/05/2021   Procedure: ESOPHAGOGASTRODUODENOSCOPY (EGD) WITH PROPOFOL;  Surgeon: Lanelle Bal, DO;  [facility];  Service: Endoscopy;  Laterality: N/A;   ESOPHAGOGASTRODUODENOSCOPY (EGD) WITH PROPOFOL N/A 08/27/2022   Procedure: ESOPHAGOGASTRODUODENOSCOPY (EGD) WITH PROPOFOL;  Surgeon: Lanelle Bal, DO;  [facility];  Service: Endoscopy;  Laterality: N/A;   GIVENS CAPSULE STUDY N/A 03/07/2019   Procedure: GIVENS CAPSULE STUDY;  Surgeon: Malissa Hippo, MD;  [facility];  Service: Endoscopy;  Laterality: N/A;  7:30   GIVENS CAPSULE STUDY N/A 04/22/2021   Procedure: GIVENS CAPSULE STUDY;  Surgeon: Malissa Hippo, MD;  [facility];  Service: Endoscopy;  Laterality: N/A;  7:30   GIVENS CAPSULE STUDY N/A 08/27/2022   Procedure: GIVENS CAPSULE STUDY;  Surgeon: Lanelle Bal, DO;  [facility];  Service: Endoscopy;  Laterality: N/A;   HOT HEMOSTASIS N/A 08/11/2022   Procedure: HOT HEMOSTASIS (ARGON PLASMA COAGULATION/BICAP);  Surgeon: Beverley Fiedler, MD;  Location: [facility] ENDOSCOPY;  Service: Gastroenterology;  Laterality: N/A;   HOT HEMOSTASIS N/A 12/01/2022   Procedure: HOT HEMOSTASIS (ARGON PLASMA COAGULATION/BICAP);  Surgeon: Benancio Deeds, MD;  Location: [facility] ENDOSCOPY;  Service: Gastroenterology;  Laterality: N/A;   INSERTION OF DIALYSIS CATHETER N/A 10/05/2020   Procedure: ABORTED TUNNELED DIALYSIS CATHETER PLACEMENT RIGHT INTERNAL JUGULAR VEIN ;  Surgeon: Lucretia Roers, MD;  Location: [facility] ORS;  Service: General;  Laterality: N/A;   INTRAOPERATIVE TRANSESOPHAGEAL ECHOCARDIOGRAM  06/13/2012   Procedure: INTRAOPERATIVE  TRANSESOPHAGEAL ECHOCARDIOGRAM;  Surgeon: Delight Ovens, MD;  Location: [facility] OR;  Service: Open Heart Surgery;  Laterality: N/A;   IR DIALY SHUNT INTRO NEEDLE/INTRACATH INITIAL W/IMG LEFT Left 10/06/2020   IR FLUORO GUIDE CV LINE RIGHT  06/17/2020   IR FLUORO GUIDE CV LINE RIGHT  11/12/2022   IR GENERIC HISTORICAL  07/26/2016   IR FLUORO GUIDE CV LINE RIGHT 07/26/2016 Berdine Dance, MD [facility]   IR GENERIC HISTORICAL  07/26/2016   IR US GUIDE VASC ACCESS RIGHT 07/26/2016 Berdine Dance, MD [facility]   IR GENERIC HISTORICAL  08/02/2016   IR US GUIDE VASC ACCESS RIGHT 08/02/2016 Berdine Dance, MD [facility]   IR GENERIC HISTORICAL  08/02/2016   IR FLUORO GUIDE CV LINE RIGHT 08/02/2016 Berdine Dance, MD [facility]   IR RADIOLOGY PERIPHERAL GUIDED IV START  03/28/2017   IR REMOVAL TUN CV CATH W/O FL  08/11/2020   IR REMOVAL TUN CV CATH W/O FL  11/15/2022   IR THROMBECTOMY AV FISTULA W/THROMBOLYSIS INC/SHUNT/IMG LEFT Left 06/17/2020   IR US GUIDE VASC ACCESS LEFT  06/17/2020   IR US GUIDE VASC ACCESS RIGHT  03/28/2017   IR US GUIDE VASC ACCESS RIGHT  06/17/2020   IR US GUIDE VASC ACCESS RIGHT  11/12/2022   LEFT HEART CATH AND CORONARY ANGIOGRAPHY N/A 09/20/2016   Procedure: Left Heart Cath and Coronary Angiography;  Surgeon: Lyn Records, MD;  Location: [facility] INVASIVE CV LAB;  Service: Cardiovascular;  Laterality: N/A;   LEFT HEART CATH AND CORS/GRAFTS ANGIOGRAPHY N/A 10/13/2016   Procedure: Left Heart Cath and Cors/Grafts Angiography;  Surgeon: Lennette Bihari, MD;  [facility];  Service: Cardiovascular;  Laterality: N/A;   LEFT HEART CATH AND CORS/GRAFTS ANGIOGRAPHY N/A 07/13/2018   Procedure: LEFT HEART CATH AND CORS/GRAFTS ANGIOGRAPHY;  Surgeon: Swaziland, Peter M, MD;  Location: [facility] INVASIVE CV LAB;  Service: Cardiovascular;  Laterality: N/A;   LEFT HEART CATH AND CORS/GRAFTS ANGIOGRAPHY N/A 07/22/2021   Procedure: LEFT HEART CATH AND CORS/GRAFTS ANGIOGRAPHY;  Surgeon: Runell Gess, MD;   [facility] CV LAB;  Service: Cardiovascular;  Laterality: N/A;   LEFT HEART CATHETERIZATION WITH CORONARY ANGIOGRAM N/A 12/15/2011   Procedure: LEFT HEART CATHETERIZATION WITH CORONARY ANGIOGRAM;  Surgeon: Kathleene Hazel, MD;  Location: [facility] CATH LAB;  Service: Cardiovascular;  Laterality: N/A;   LEFT HEART CATHETERIZATION WITH CORONARY ANGIOGRAM N/A 01/10/2012   Procedure: LEFT HEART CATHETERIZATION WITH CORONARY ANGIOGRAM;  Surgeon: Peter M Swaziland, MD;  Location: [facility] CATH LAB;  Service: Cardiovascular;  Laterality: N/A;   LEFT HEART CATHETERIZATION WITH CORONARY ANGIOGRAM N/A 06/08/2012   Procedure: LEFT HEART CATHETERIZATION WITH CORONARY ANGIOGRAM;  Surgeon: Kathleene Hazel, MD;  Location: [facility] CATH LAB;  Service: Cardiovascular;  Laterality: N/A;   LEFT HEART CATHETERIZATION WITH CORONARY/GRAFT ANGIOGRAM N/A 12/10/2013   Procedure: LEFT HEART CATHETERIZATION WITH Isabel Caprice;  Surgeon: Corky Crafts, MD;  Location: [facility] CATH LAB;  Service: Cardiovascular;  Laterality: N/A;   OVARY SURGERY     ovarian cancer   POLYPECTOMY  03/09/2019   Procedure: POLYPECTOMY;  Surgeon: Malissa Hippo, MD;  [facility]: [facility];  Service: Endoscopy;;  cecal    POLYPECTOMY N/A 09/26/2019   Procedure: DUODENAL POLYPECTOMY;  Surgeon: Malissa Hippo, MD;  [facility];  Service: Endoscopy;  Laterality: N/A;   POLYPECTOMY  08/11/2022   Procedure: POLYPECTOMY;  Surgeon: Beverley Fiedler, MD;  Location: [facility] ENDOSCOPY;  Service: Gastroenterology;;   REVISION OF ARTERIOVENOUS GORETEX GRAFT N/A 02/24/2017   Procedure: REVISION OF ARTERIOVENOUS GORETEX GRAFT (RESECTION);  Surgeon: Renford Dills, MD;  Location: [facility] ORS;  Service: Vascular;  Laterality: N/A;   REVISON OF ARTERIOVENOUS FISTULA Left 06/19/2020   Procedure: REVISION OF LEFT UPPER ARM AV GRAFT WITH INTERPOSITION JUMP GRAFT USING GORE LIMB;  Surgeon: Cephus Shelling, MD;  Location: [facility] OR;  Service:  Vascular;  Laterality: Left;   SHUNTOGRAM N/A 10/15/2013   Procedure: Fistulogram;  Surgeon: Nada Libman, MD;  Location: [facility] CATH LAB;  Service: Cardiovascular;  Laterality: N/A;   SUBMUCOSAL TATTOO INJECTION  11/01/2022   Procedure: SUBMUCOSAL TATTOO INJECTION;  Surgeon: Dolores Frame, MD;  [facility];  Service: Gastroenterology;;   THROMBECTOMY / ARTERIOVENOUS GRAFT REVISION  2011   left upper arm   TUBAL LIGATION  1980's   UPPER EXTREMITY ANGIOGRAPHY Bilateral 12/06/2016   Procedure: Upper Extremity Angiography;  Surgeon: Renford Dills, MD;  Location: [facility] INVASIVE CV LAB;  Service: Cardiovascular;  Laterality: Bilateral;   UPPER EXTREMITY INTERVENTION Left 06/06/2017   Procedure: UPPER EXTREMITY INTERVENTION;  Surgeon: Renford Dills, MD;  Location: [facility] INVASIVE CV LAB;  Service: Cardiovascular;  Laterality: Left;   UPPER EXTREMITY VENOGRAPHY Left 10/04/2022   Procedure: UPPER EXTREMITY VENOGRAPHY;  Surgeon: Renford Dills, MD;  Location: [facility] INVASIVE CV LAB;  Service: Cardiovascular;  Laterality: Left;     A IV Location/Drains/Wounds Patient Lines/Drains/Airways Status     Active Line/Drains/Airways     Name Placement date Placement time Site Days   Fistula / Graft Left Upper arm Arteriovenous fistula 03/19/19  --  Upper arm  1451   Hemodialysis Catheter Left Subclavian Double lumen Temporary (Non-Tunneled) 06/09/22  --  Subclavian  273   Wound / Incision (Open or Dehisced) 11/14/21 Arm Left;Posterior;Proximal;Upper left proximal axilla large blackhead 11/14/21  1736  Arm  480            Intake/Output Last 24 hours No intake or output data in the 24 hours ending 03/09/23 0813  Labs/Imaging Results for orders placed or performed during the hospital encounter of 03/08/23 (from the past 48 hour(s))  CBC with Differential     Status: Abnormal   Collection Time: 03/08/23  2:35 PM  Result Value Ref Range   WBC 5.5 4.0 - 10.5 K/uL   RBC  1.93 (L) 3.87 - 5.11 MIL/uL   Hemoglobin 7.0 (L) 12.0 - 15.0 g/dL   HCT 82.9 (L) 56.2 - 13.0 %   MCV 114.5 (H) 80.0 - 100.0 fL   MCH 36.3 (H) 26.0 - 34.0 pg   MCHC 31.7 30.0 - 36.0 g/dL   RDW Not Measured 86.5 - 15.5 %   Platelets ACLMP 150 - 400 K/uL    Comment: SPECIMEN CHECKED FOR CLOTS REPEATED TO VERIFY PLATELETS APPEAR ADEQUATE    nRBC 0.0 0.0 - 0.2 %   Neutrophils Relative % 74 %   Neutro Abs 4.1 1.7 - 7.7 K/uL   Lymphocytes Relative 11 %   Lymphs Abs 0.6 (L) 0.7 - 4.0 K/uL   Monocytes Relative 8 %   Monocytes Absolute 0.5 0.1 - 1.0 K/uL   Eosinophils Relative 4 %   Eosinophils Absolute 0.2 0.0 - 0.5 K/uL   Basophils Relative 2 %   Basophils Absolute 0.1 0.0 - 0.1 K/uL   WBC Morphology MORPHOLOGY UNREMARKABLE    Immature Granulocytes 1 %   Abs Immature Granulocytes 0.03 0.00 - 0.07 K/uL   Dimorphism PRESENT     Comment: Performed at [facility], [address], [place], [place] [address]  Comprehensive metabolic panel     Status: Abnormal   Collection Time: 03/08/23  2:35 PM  Result Value Ref Range   Sodium 137 135 - 145 mmol/L   Potassium 3.5 3.5 - 5.1 mmol/L   Chloride 99 98 - 111 mmol/L   CO2 27 22 - 32 mmol/L   Glucose, Bld 108 (H) 70 - 99 mg/dL    Comment: Glucose reference range applies only to samples taken after fasting for at least 8 hours.   BUN 35 (H) 8 - 23 mg/dL   Creatinine, Ser 6.29 (H) 0.44 - 1.00 mg/dL   Calcium 8.7 (L) 8.9 - 10.3 mg/dL   Total Protein 5.7 (L) 6.5 - 8.1 g/dL   Albumin 2.7 (L) 3.5 - 5.0  g/dL   AST 8 (L) 15 - 41 U/L   ALT 8 0 - 44 U/L   Alkaline Phosphatase 71 38 - 126 U/L   Total Bilirubin 0.8 0.3 - 1.2 mg/dL   GFR, Estimated 7 (L) >60 mL/min    Comment: (NOTE) Calculated using the CKD-EPI Creatinine Equation (2021)    Anion gap 11 5 - 15    Comment: Performed at [facility], [address] [place], [place] [address]  Type and screen [facility]     Status: None (Preliminary result)   Collection Time: 03/08/23  2:35  PM  Result Value Ref Range   ABO/RH(D) O POS    Antibody Screen NEG    Sample Expiration 03/11/2023,2359    Unit Number [id number]    Blood Component Type RED CELLS,LR    Unit division 00    Status of Unit ALLOCATED    Donor AG Type NEGATIVE FOR KIDD B ANTIGEN    Transfusion Status OK TO TRANSFUSE    Crossmatch Result COMPATIBLE    Unit Number [id number]    Blood Component Type RED CELLS,LR    Unit division 00    Status of Unit ALLOCATED    Donor AG Type NEGATIVE FOR KIDD B ANTIGEN    Transfusion Status OK TO TRANSFUSE    Crossmatch Result COMPATIBLE   Prepare RBC (crossmatch)     Status: None   Collection Time: 03/08/23  4:45 PM  Result Value Ref Range   Order Confirmation      ORDER PROCESSED BY BLOOD BANK Performed at [facility], [address], [place], [place] [address]   Basic metabolic panel     Status: Abnormal   Collection Time: 03/09/23  3:31 AM  Result Value Ref Range   Sodium 137 135 - 145 mmol/L   Potassium 4.2 3.5 - 5.1 mmol/L   Chloride 99 98 - 111 mmol/L   CO2 24 22 - 32 mmol/L   Glucose, Bld 135 (H) 70 - 99 mg/dL    Comment: Glucose reference range applies only to samples taken after fasting for at least 8 hours.   BUN 38 (H) 8 - 23 mg/dL   Creatinine, Ser 6.29 (H) 0.44 - 1.00 mg/dL   Calcium 8.7 (L) 8.9 - 10.3 mg/dL   GFR, Estimated 6 (L) >60 mL/min    Comment: (NOTE) Calculated using the CKD-EPI Creatinine Equation (2021)    Anion gap 14 5 - 15    Comment: Performed at [facility], [address] [place], [place] [address]  CBC     Status: Abnormal   Collection Time: 03/09/23  3:31 AM  Result Value Ref Range   WBC 9.4 4.0 - 10.5 K/uL    Comment: WHITE COUNT CONFIRMED ON SMEAR   RBC 1.96 (L) 3.87 - 5.11 MIL/uL   Hemoglobin 7.2 (L) 12.0 - 15.0 g/dL   HCT 32.4 (L) 40.1 - 02.7 %   MCV 114.8 (H) 80.0 - 100.0 fL    Comment: REPEATED TO VERIFY   MCH 36.7 (H) 26.0 - 34.0 pg   MCHC 32.0 30.0 - 36.0 g/dL   RDW Not Measured 25.3 - 15.5 %    Platelets 255 150 - 400 K/uL   nRBC 0.0 0.0 - 0.2 %    Comment: Performed at [facility], [address], [place], [place] [address]   *Note: Due to a large number of results and/or encounters for the requested time period, some results have not been displayed. A complete  set of results can be found in Results Review.   No results found.  Pending Labs Unresulted Labs (From admission, onward)    None       Vitals/Pain Today's Vitals   03/09/23 0300 03/09/23 0620 03/09/23 0700 03/09/23 0717  BP: (!) 178/62 (!) 166/50 (!) 176/58   Pulse: 61 (!) 53 (!) 54   Resp: 20 17 18    Temp:  98.5 F (36.9 C)    TempSrc:      SpO2: 100% 100% 100%   Weight:      Height:      PainSc:    Asleep    Isolation Precautions No active isolations  Medications Medications  0.9 %  sodium chloride infusion (Manually program via Guardrails IV Fluids) (has no administration in time range)  multivitamin (RENA-VIT) tablet 1 tablet (1 tablet Oral Not Given 03/08/23 1905)  nitroGLYCERIN (NITROSTAT) SL tablet 0.4 mg (has no administration in time range)  rosuvastatin (CRESTOR) tablet 10 mg (10 mg Oral Given 03/08/23 2104)  folic acid (FOLVITE) tablet 1 mg (1 mg Oral Not Given 03/08/23 1908)  hydrOXYzine (ATARAX) tablet 25 mg (has no administration in time range)  guaiFENesin-codeine 100-10 MG/5ML solution 5 mL (has no administration in time range)  cyclobenzaprine (FLEXERIL) tablet 5 mg (has no administration in time range)  levETIRAcetam (KEPPRA) tablet 500 mg (500 mg Oral Not Given 03/08/23 1907)  gabapentin (NEURONTIN) capsule 100 mg (100 mg Oral Given 03/08/23 1905)  ALPRAZolam (XANAX) tablet 0.5 mg (has no administration in time range)  calcitRIOL (ROCALTROL) capsule 1.75 mcg (has no administration in time range)  cinacalcet (SENSIPAR) tablet 30 mg (has no administration in time range)  hydrALAZINE (APRESOLINE) tablet 25 mg (25 mg Oral Given 03/08/23 2105)  benzonatate (TESSALON) capsule 100 mg (has no  administration in time range)  amiodarone (PACERONE) tablet 100 mg (200 mg Oral Not Given 03/08/23 1906)  metoprolol tartrate (LOPRESSOR) tablet 12.5 mg (12.5 mg Oral Not Given 03/08/23 2104)  levothyroxine (SYNTHROID) tablet 75 mcg (75 mcg Oral Given 03/09/23 0503)  ondansetron (ZOFRAN) tablet 4 mg (has no administration in time range)  cyanocobalamin (VITAMIN B12) injection 1,000 mcg (1,000 mcg Intramuscular Not Given 03/08/23 1909)  pantoprazole (PROTONIX) EC tablet 40 mg (40 mg Oral Given 03/08/23 1905)  irbesartan (AVAPRO) tablet 37.5 mg (has no administration in time range)  hydrALAZINE (APRESOLINE) injection 5 mg (has no administration in time range)  octreotide (SANDOSTATIN) injection 100 mcg (100 mcg Intravenous Given 03/09/23 0503)    Mobility I was told in report she was unsure of pt transfer. Last time pt was here he could walk with walker but w/c was much safer.     Focused Assessments Per report: Old fistula to left arm, and port to chest for dialysis, Pt is Tues, Thurs , Sat schedule . Pt is on RA but while sleeping oxygen declined so 3 lpm was applied.     R Recommendations: See Admitting Provider Note  Report given to:   Additional Notes: Pt takes pills whole. Night nurse stated pt became slightly confused around midnight but otherwise A&O x 3.

## 2023-03-09 NOTE — ED Notes (Signed)
IV still unable to be established. Several nurse attempted and not successful. Message sent to attending.

## 2023-03-09 NOTE — Care Management Obs Status (Signed)
MEDICARE OBSERVATION STATUS NOTIFICATION   Patient Details  Name: Shawna [name] MRN: [id number] Date of Birth: Dec 19, 1940   Medicare Observation Status Notification Given:  Yes    [name] 03/09/2023, 4:07 PM

## 2023-03-09 NOTE — ED Notes (Signed)
Dr. Sharl Ma only wants to give 2 bags of blood.

## 2023-03-09 NOTE — ED Provider Notes (Signed)
[place] EMERGENCY DEPARTMENT AT [facility] Provider Note   CSN: [id number] Arrival date & time: 03/08/23  1359     History  Chief Complaint  Patient presents with   Abnormal Lab    Shawna [name] is a 82 y.o. female.   Abnormal Lab Patient presents with anemia.  Has had history of same.  Sent in from dialysis today before she had dialysis done.  Reportedly had hemoglobin in the sixes.  Has had previous history of anemia requiring transfusion.  Has had chronic AVMs.  Stool is black at times but states it has not been recently.  Was not dialyzed today.     Home Medications Prior to Admission medications   Medication Sig Start Date End Date Taking? Authorizing Provider  ALPRAZolam Prudy Feeler) 0.5 MG tablet Take 1 tablet (0.5 mg total) by mouth daily as needed for up to 4 doses for anxiety. 12/19/22  Yes Lanae Boast, MD  amiodarone (PACERONE) 100 MG tablet Take 1 tablet (100 mg total) by mouth daily. 02/02/23 03/08/23 Yes Shah, Pratik D, DO  benzonatate (TESSALON) 100 MG capsule Take 1 capsule (100 mg total) by mouth 3 (three) times daily as needed for cough. 01/18/23  Yes Tilden Fossa, MD  calcitRIOL (ROCALTROL) 0.25 MCG capsule Take 7 capsules (1.75 mcg total) by mouth every Monday, Wednesday, and Friday with hemodialysis. 12/21/22  Yes Lanae Boast, MD  cinacalcet (SENSIPAR) 30 MG tablet Take 1 tablet (30 mg total) by mouth every Monday, Wednesday, and Friday with hemodialysis. 12/19/22  Yes Lanae Boast, MD  cyanocobalamin (VITAMIN B12) 1000 MCG/ML injection Inject 1,000 mcg into the muscle every 30 (thirty) days.   Yes [provider]  cyclobenzaprine (FLEXERIL) 5 MG tablet Take 5 mg by mouth 3 (three) times daily as needed.   Yes [provider]  folic acid (FOLVITE) 1 MG tablet Take 1 mg by mouth daily. 05/24/22  Yes [provider]  gabapentin (NEURONTIN) 100 MG capsule Take 1 capsule (100 mg total) by mouth every evening. 12/13/22  Yes Jerald Kief,  MD  guaiFENesin-codeine 100-10 MG/5ML syrup Take 5 mLs by mouth every 4 (four) hours as needed for cough. 10/23/22  Yes Jacalyn Lefevre, MD  hydrALAZINE (APRESOLINE) 25 MG tablet Take 1 tablet (25 mg total) by mouth 2 (two) times daily. Hold for sbp <110 12/27/22  Yes Kc, Dayna Barker, MD  hydrOXYzine (ATARAX) 25 MG tablet Take 25 mg by mouth every 6 (six) hours as needed for itching. 06/06/22  Yes [provider]  levETIRAcetam (KEPPRA) 500 MG tablet Take 1 tablet (500 mg total) by mouth daily. 11/15/22  Yes Leroy Sea, MD  levothyroxine (SYNTHROID) 75 MCG tablet Take 1 tablet (75 mcg total) by mouth daily at 6 (six) AM. 02/02/23  Yes Sherryll Burger, Pratik D, DO  metoprolol tartrate (LOPRESSOR) 25 MG tablet Take 0.5 tablets (12.5 mg total) by mouth 2 (two) times daily. 02/01/23 03/08/23 Yes Shah, Pratik D, DO  multivitamin (RENA-VIT) TABS tablet Take 1 tablet by mouth daily.   Yes [provider]  nitroGLYCERIN (NITROSTAT) 0.4 MG SL tablet Place 0.4 mg under the tongue every 5 (five) minutes as needed for chest pain.   Yes [provider]  olmesartan (BENICAR) 20 MG tablet Take 20 mg by mouth daily. 01/14/23  Yes [provider]  omeprazole (PRILOSEC) 20 MG capsule Take 1 capsule (20 mg total) by mouth 2 (two) times daily before a meal. Patient taking differently: Take 20 mg by mouth as  needed (acid reflux). 02/01/23 03/08/23 Yes Shah, Pratik D, DO  ondansetron (ZOFRAN) 4 MG tablet Take 4 mg by mouth every 8 (eight) hours as needed. 02/27/23  Yes [provider]  rosuvastatin (CRESTOR) 10 MG tablet Take 1 tablet by mouth once daily 06/06/22  Yes Strader, [place] M, PA-C  octreotide (SANDOSTATIN LAR) 20 MG injection Inject 20 mg into the muscle every 28 (twenty-eight) days. Patient not taking: Reported on 03/08/2023 11/15/22   Leroy Sea, MD      Allergies    Amlodipine, Aspirin, Nitrofurantoin, Ranexa [ranolazine], Bactrim [sulfamethoxazole-trimethoprim], Iodinated  contrast media, Iron, Tylenol [acetaminophen], Gabapentin, Sucroferric oxyhydroxide, Levaquin [levofloxacin in d5w], Levofloxacin, Plavix [clopidogrel bisulfate], Protonix [pantoprazole sodium], and Venofer [ferric oxide]    Review of Systems   Review of Systems  Physical Exam Updated Vital Signs BP (!) 188/66   Pulse 68   Temp 99 F (37.2 C)   Resp 18   Ht 5\' 2"  (1.575 m)   Wt 56 kg   SpO2 98%   BMI 22.58 kg/m  Physical Exam Vitals and nursing note reviewed.  HENT:     Head: Atraumatic.  Cardiovascular:     Rate and Rhythm: Regular rhythm.  Abdominal:     Tenderness: There is no abdominal tenderness.  Skin:    Capillary Refill: Capillary refill takes less than 2 seconds.     Coloration: Skin is pale.  Neurological:     Mental Status: She is alert and oriented to person, place, and time.     ED Results / Procedures / Treatments   Labs (all labs ordered are listed, but only abnormal results are displayed) Labs Reviewed  CBC WITH DIFFERENTIAL/PLATELET - Abnormal; Notable for the following components:      Result Value   RBC 1.93 (*)    Hemoglobin 7.0 (*)    HCT 22.1 (*)    MCV 114.5 (*)    MCH 36.3 (*)    Lymphs Abs 0.6 (*)    All other components within normal limits  COMPREHENSIVE METABOLIC PANEL - Abnormal; Notable for the following components:   Glucose, Bld 108 (*)    BUN 35 (*)    Creatinine, Ser 6.03 (*)    Calcium 8.7 (*)    Total Protein 5.7 (*)    Albumin 2.7 (*)    AST 8 (*)    GFR, Estimated 7 (*)    All other components within normal limits  BASIC METABOLIC PANEL  CBC  TYPE AND SCREEN  PREPARE RBC (CROSSMATCH)    EKG None  Radiology No results found.  Procedures Procedures    Medications Ordered in ED Medications  0.9 %  sodium chloride infusion (Manually program via Guardrails IV Fluids) (has no administration in time range)  multivitamin (RENA-VIT) tablet 1 tablet (1 tablet Oral Not Given 03/08/23 1905)  nitroGLYCERIN (NITROSTAT)  SL tablet 0.4 mg (has no administration in time range)  rosuvastatin (CRESTOR) tablet 10 mg (10 mg Oral Given 03/08/23 2104)  folic acid (FOLVITE) tablet 1 mg (1 mg Oral Not Given 03/08/23 1908)  hydrOXYzine (ATARAX) tablet 25 mg (has no administration in time range)  guaiFENesin-codeine 100-10 MG/5ML solution 5 mL (has no administration in time range)  cyclobenzaprine (FLEXERIL) tablet 5 mg (has no administration in time range)  levETIRAcetam (KEPPRA) tablet 500 mg (500 mg Oral Not Given 03/08/23 1907)  gabapentin (NEURONTIN) capsule 100 mg (100 mg Oral Given 03/08/23 1905)  ALPRAZolam (XANAX) tablet 0.5 mg (has no administration in time range)  calcitRIOL (ROCALTROL) capsule 1.75 mcg (has no administration in time range)  cinacalcet (SENSIPAR) tablet 30 mg (has no administration in time range)  hydrALAZINE (APRESOLINE) tablet 25 mg (25 mg Oral Given 03/08/23 2105)  benzonatate (TESSALON) capsule 100 mg (has no administration in time range)  amiodarone (PACERONE) tablet 100 mg (200 mg Oral Not Given 03/08/23 1906)  metoprolol tartrate (LOPRESSOR) tablet 12.5 mg (12.5 mg Oral Not Given 03/08/23 2104)  levothyroxine (SYNTHROID) tablet 75 mcg (has no administration in time range)  ondansetron (ZOFRAN) tablet 4 mg (has no administration in time range)  cyanocobalamin (VITAMIN B12) injection 1,000 mcg (1,000 mcg Intramuscular Not Given 03/08/23 1909)  pantoprazole (PROTONIX) EC tablet 40 mg (40 mg Oral Given 03/08/23 1905)  irbesartan (AVAPRO) tablet 37.5 mg (has no administration in time range)  hydrALAZINE (APRESOLINE) injection 5 mg (has no administration in time range)  octreotide (SANDOSTATIN) injection 100 mcg (100 mcg Intravenous Not Given 03/08/23 1917)    ED Course/ Medical Decision Making/ A&P                                 Medical Decision Making Amount and/or Complexity of Data Reviewed Labs: ordered.  Risk Decision regarding hospitalization.   Patient with anemia.  History of  same.  Hemoglobin down to 7.  This is a decrease from where she had been.  Will require transfusion.  Discussed with hospitalist.  Reviewed discharge note.  However discussed also with Dr. Arrie Aran from nephrology.  Likely would not be able to be dialyzed tonight.  Will transfuse 1 unit now and he will write for another unit if she is dialyzed tonight if not can be transfused after dialysis tomorrow.  Will admit.  CRITICAL CARE Performed by: Benjiman Core Total critical care time: 30 minutes Critical care time was exclusive of separately billable procedures and treating other patients. Critical care was necessary to treat or prevent imminent or life-threatening deterioration. Critical care was time spent personally by me on the following activities: development of treatment plan with patient and/or surrogate as well as nursing, discussions with consultants, evaluation of patient's response to treatment, examination of patient, obtaining history from patient or surrogate, ordering and performing treatments and interventions, ordering and review of laboratory studies, ordering and review of radiographic studies, pulse oximetry and re-evaluation of patient's condition.         Final Clinical Impression(s) / ED Diagnoses Final diagnoses:  Anemia, unspecified type  End stage renal disease on dialysis [facility])    Rx / DC Orders ED Discharge Orders     None         Benjiman Core, MD 03/09/23 0004

## 2023-03-09 NOTE — Progress Notes (Signed)
[facility]  PROGRESS NOTE  Shawna [name] UUV:[id number] DOB: August 03, 1940 DOA: 03/08/2023 PCP: [facility]   Brief HPI:   82 year old female with past medical history of hypertension, hyperlipidemia, CAD status post CABG and PCI, paroxysmal atrial fibrillation no longer on anticoagulation due to recurrent GI bleed, ESRD on HD, MWF, seizure disorder, hypothyroidism, recurrent GI bleed from AVM, currently on IM octreotide as outpatient who was sent to ED from HD center due to low hemoglobin of 6.5.  Patient also complained of chest pain.  1 unit PRBC was ordered last night however patient did not have IV access so did not get blood transfusion.    Assessment/Plan:   Symptomatic anemia -History of acute on chronic blood loss anemia due to history of AVMs -Anemia of chronic disease -2 units PRBC ordered yesterday, could not be given due to lack of IV access -Patient to get 2 units of blood today with hemodialysis  Chest pain -Resolved -EKG obtained showed left bundle branch block, unchanged from previous -Troponin x 2 ordered -Chest pain resolved after blood pressure improved with antihypertensive medications -Patient to get 2 units of blood and hemodialysis today -Will continue to monitor  Chronic systolic CHF with EF 30-35% CAD status post CABG with residual small vessel disease -Stable  Hypertension -Continue home medications -Continue as needed hydralazine  Hyperlipidemia -Continue statin  Diabetes mellitus type 2 -Diet controlled  Hypothyroidism -Continue Synthroid     Medications     sodium chloride   Intravenous Once   sodium chloride   Intravenous Once   amiodarone  100 mg Oral Daily   [START ON 03/10/2023] calcitRIOL  1.75 mcg Oral Q M,W,F-HD   Chlorhexidine Gluconate Cloth  6 each Topical Q0600   [START ON 03/10/2023] cinacalcet  30 mg Oral Q M,W,F-HD   cyanocobalamin  1,000 mcg Intramuscular Q30 days   folic acid  1 mg Oral Daily    gabapentin  100 mg Oral QPM   hydrALAZINE  25 mg Oral BID   irbesartan  37.5 mg Oral Daily   levETIRAcetam  500 mg Oral Daily   levothyroxine  75 mcg Oral Q0600   metoprolol tartrate  12.5 mg Oral BID   multivitamin  1 tablet Oral Daily   octreotide  100 mcg Intravenous Q12H   pantoprazole  40 mg Oral Daily   rosuvastatin  10 mg Oral QHS     Data Reviewed:   CBG:  No results for input(s): "GLUCAP" in the last 168 hours.  SpO2: 97 % O2 Flow Rate (L/min): 2 L/min    Vitals:   03/09/23 1045 03/09/23 1145 03/09/23 1200 03/09/23 1213  BP: (!) 168/51 (!) 163/46 (!) 177/58   Pulse: (!) 56 (!) 56 (!) 58   Resp: 18 17 18    Temp:   (!) 97.4 F (36.3 C)   TempSrc:   Oral   SpO2: 100% 100% 100% 97%  Weight:   56.1 kg   Height:          Data Reviewed:  Basic Metabolic Panel: Recent Labs  Lab 03/08/23 1435 03/09/23 0331  NA 137 137  K 3.5 4.2  CL 99 99  CO2 27 24  GLUCOSE 108* 135*  BUN 35* 38*  CREATININE 6.03* 6.31*  CALCIUM 8.7* 8.7*    CBC: Recent Labs  Lab 03/08/23 1435 03/09/23 0331  WBC 5.5 9.4  NEUTROABS 4.1  --   HGB 7.0* 7.2*  HCT 22.1* 22.5*  MCV 114.5* 114.8*  PLT ACLMP  255    LFT Recent Labs  Lab 03/08/23 1435  AST 8*  ALT 8  ALKPHOS 71  BILITOT 0.8  PROT 5.7*  ALBUMIN 2.7*     Antibiotics: Anti-infectives (From admission, onward)    None        DVT prophylaxis: SCDs  Code Status: Full code  Family Communication: No family at bedside   CONSULTS nephrology   Subjective   Complain of chest pain this morning, EKG obtained showed left bundle branch block.  No change from previous EKG.  Chest pain resolved after patient received medications for hypertension.   Objective    Physical Examination:   General-appears in no acute distress Heart-S1-S2, regular, no murmur auscultated Lungs-clear to auscultation bilaterally, no wheezing or crackles auscultated Abdomen-soft, nontender, no organomegaly Extremities-no  edema in the lower extremities Neuro-alert, oriented x3, no focal deficit noted  Status is: Inpatient:             [name]   [facility] If 7PM-7AM, please contact night-coverage at [email or web address], Office  [phone]   03/09/2023, 2:03 PM  LOS: 0 days

## 2023-03-09 NOTE — ED Notes (Signed)
Blood is ready. Per MD patient will be receiving with dialysis

## 2023-03-09 NOTE — Progress Notes (Signed)
   HEMODIALYSIS TREATMENT NOTE:   Uneventful 3.5 hour heparin-free treatment completed using [facility] TDC.  Cath exit site is unremarkable. 2 units pRBC transfused.  Tablo removed 1900 ml.  Factoring blood and NS, net UF 1.3 L.  All blood was returned.  Octreatide was given IVP as pt has no PIV access.  Will give second dose tomorrow morning at time of lab collection - have d/w phlebotomist.  Post-HD she is oriented to self only, thinks [facility]'re [facility] a [facility].  Has experienced sundowning during prior hospitalizations  Post-HD:  03/09/23 1945  Vitals  Temp 97.7 F (36.5 C)  Temp Source Oral  BP (!) 160/56  MAP (mmHg) 86  BP Location Right Arm  BP Method Automatic  Patient Position (if appropriate) Lying  Pulse Rate 62  Pulse Rate Source Monitor  ECG Heart Rate 62  Resp 18  Oxygen Therapy  SpO2 100 %  O2 Device Room Air  Post Treatment  Dialyzer Clearance Lightly streaked  Hemodialysis Intake (mL) 0 mL  Liters Processed 84  Fluid Removed (mL) 1170 mL (1900 - (161+096+045) = 1170)  Tolerated HD Treatment Yes  Post-Hemodialysis Comments Goal met  Hemodialysis Catheter Left Subclavian Double lumen Temporary (Non-Tunneled)  Placement Date: 06/09/22   Placed prior to admission: Yes  Orientation: Left  Access Location: Subclavian  Hemodialysis Catheter Type: Double lumen Temporary (Non-Tunneled)  Site Condition No complications  Blue Lumen Status Flushed;Heparin locked;Dead end cap in place  Red Lumen Status Flushed;Heparin locked;Dead end cap in place  Purple Lumen Status N/A  Catheter fill solution Heparin 1000 units/ml  Catheter fill volume (Arterial) 1.9 cc  Catheter fill volume (Venous) 1.9  Dressing Type Transparent;Tube stabilization device  Dressing Status Antimicrobial disc in place;Clean, Dry, Intact  Interventions New dressing  Drainage Description None  Dressing Change Due 03/16/23  Post treatment catheter status Capped and Clamped   Hand-off given to Charna Elizabeth, RN.  Arman Filter, RN AP K[facility]

## 2023-03-09 NOTE — Consult Note (Signed)
[facility] Renal Consultation Note    Indication for Consultation:  Management of ESRD/hemodialysis; anemia, hypertension/volume and secondary hyperparathyroidism  HPI: Shawna [name] is a 82 y.o. female with past medical history significant for hypertension, HLD, CAD status post stent, CABG, A-fib, recurrent GI bleed (AVM's), ESRD on HD MWF who presented to [facility] ED from her HD unit due to low Hgb.  She was transfused 1 unit in the ED yesterday, however her hgb remains low.  We were consulted to provide dialysis and transfuse more units with HD.  She was complaining of chest pain this morning and workup is underway.  Unable to get IV access, so ok to use [facility] for appropriate labs.  She denies any N/V/D, SOB, orthopnea, or PND.    Past Medical History:  Diagnosis Date   Acute on chronic respiratory failure with hypoxia (HCC) 10/10/2016   Anxiety    Arthritis    AVM (arteriovenous malformation) of colon    CAD (coronary artery disease)    a. s/p CABG in 2013 b. DES to D1 in 10/2016. c. cath in 07/2018 showing patent grafts with occlusion of D1 at prior stent site and progression of PDA disease --> medical management recommended   Carotid artery disease (HCC)    a. 60-79% LICA, 03/2012    Chronic bronchitis (HCC)    Chronic HFrEF (heart failure with reduced ejection fraction) (HCC)    Colon cancer (HCC) 1992   Esophageal stricture    ESRD on hemodialysis (HCC)    ESRD due to HTN, started dialysis 2011 and gets HD at [facility] with Dr Fausto Skillern on MWF schedule.  Access is LUA AVF as of Sept 2014.    GERD (gastroesophageal reflux disease)    High cholesterol 12/2011   History of blood transfusion 07/2011; 12/2011; 01/2012 X 2; 04/2012   History of gout    History of lower GI bleeding    Hypertension    Iron deficiency anemia    Jugular vein occlusion, right (HCC)    Mitral regurgitation    a. Moderate by echo, 02/2012   Mitral valve disease    NSVT (nonsustained ventricular  tachycardia) (HCC)    Ovarian cancer (HCC) 1992   PAF (paroxysmal atrial fibrillation) (HCC)    Pneumonia ~ 2009   PUD (peptic ulcer disease)    TIA (transient ischemic attack)    Tricuspid valve disease    Past Surgical History:  Procedure Laterality Date   A/V FISTULAGRAM Left 10/04/2022   Procedure: A/V Fistulagram;  Surgeon: Renford Dills, MD;  Location: [facility] INVASIVE CV LAB;  Service: Cardiovascular;  Laterality: Left;   A/V SHUNTOGRAM Left 03/19/2019   Procedure: A/V SHUNTOGRAM;  Surgeon: Renford Dills, MD;  Location: [facility] INVASIVE CV LAB;  Service: Cardiovascular;  Laterality: Left;   ABDOMINAL HYSTERECTOMY  1992   APPENDECTOMY  06/1990   AV FISTULA PLACEMENT  07/2009   left upper arm   AV FISTULA PLACEMENT Right 09/06/2016   Procedure: RIGHT FOREARM ARTERIOVENOUS (AV) GRAFT;  Surgeon: Sherren Kerns, MD;  [facility]: [facility] OR;  Service: Vascular;  Laterality: Right;   AV FISTULA PLACEMENT N/A 02/24/2017   Procedure: INSERTION OF ARTERIOVENOUS (AV) GORE-TEX GRAFT ARM (BRACHIAL AXILLARY);  Surgeon: Renford Dills, MD;  Location: [facility] ORS;  Service: Vascular;  Laterality: N/A;   AVGG REMOVAL Right 09/06/2016   Procedure: REMOVAL OF Right Arm ARTERIOVENOUS GORETEX GRAFT and Vein Patch angioplasty of brachial artery;  Surgeon: Chuck Hint, MD;  [facility] OR;  Service: Vascular;  Laterality: Right;   BIOPSY  09/26/2019   Procedure: BIOPSY;  Surgeon: Malissa Hippo, MD;  [facility]: [facility] ENDO SU[facility];  Service: Endoscopy;;   BIOPSY  11/01/2022   Procedure: BIOPSY;  Surgeon: Dolores Frame, MD;  [facility];  Service: Gastroenterology;;   COLON RESECTION  1992   COLON SURGERY     COLONOSCOPY N/A 03/09/2019   Procedure: COLONOSCOPY;  Surgeon: Malissa Hippo, MD;  [facility];  Service: Endoscopy;  Laterality: N/A;   COLONOSCOPY N/A 08/11/2022   Procedure: COLONOSCOPY;  Surgeon: Beverley Fiedler, MD;  Location: [facility] ENDOSCOPY;  Service:  Gastroenterology;  Laterality: N/A;   COLONOSCOPY WITH PROPOFOL N/A 06/08/2021   Procedure: COLONOSCOPY WITH PROPOFOL;  Surgeon: Dolores Frame, MD;  [facility];  Service: Gastroenterology;  Laterality: N/A;  9:05 /Patient is on dialysis Mon Wed Fri   CORONARY ANGIOPLASTY WITH STENT PLACEMENT  12/15/11   "2"   CORONARY ANGIOPLASTY WITH STENT PLACEMENT  y/2013   "1; makes total of 3" (05/02/2012)   CORONARY ARTERY BYPASS GRAFT  06/13/2012   Procedure: CORONARY ARTERY BYPASS GRAFTING (CABG);  Surgeon: Delight Ovens, MD;  Location: [facility] OR;  Service: Open Heart Surgery;  Laterality: N/A;  cabg x four;  using left internal mammary artery, and left leg greater saphenous vein harvested endoscopically   CORONARY STENT INTERVENTION N/A 10/13/2016   Procedure: Coronary Stent Intervention;  Surgeon: Lennette Bihari, MD;  [facility];  Service: Cardiovascular;  Laterality: N/A;   DIALYSIS/PERMA CATHETER REMOVAL N/A 04/18/2017   Procedure: DIALYSIS/PERMA CATHETER REMOVAL;  Surgeon: Renford Dills, MD;  Location: [facility] INVASIVE CV LAB;  Service: Cardiovascular;  Laterality: N/A;   DILATION AND CURETTAGE OF UTERUS     ENTEROSCOPY N/A 06/08/2021   Procedure: PUSH ENTEROSCOPY;  Surgeon: Dolores Frame, MD;  [facility];  Service: Gastroenterology;  Laterality: N/A;   ENTEROSCOPY N/A 11/01/2022   Procedure: ENTEROSCOPY;  Surgeon: Dolores Frame, MD;  [facility];  Service: Gastroenterology;  Laterality: N/A;   ENTEROSCOPY N/A 12/01/2022   Procedure: ENTEROSCOPY;  Surgeon: Benancio Deeds, MD;  Location: [facility] ENDOSCOPY;  Service: Gastroenterology;  Laterality: N/A;   ESOPHAGOGASTRODUODENOSCOPY  01/20/2012   Procedure: ESOPHAGOGASTRODUODENOSCOPY (EGD);  Surgeon: Meryl Dare, MD,FACG;  Location: [facility] ENDOSCOPY;  Service: Endoscopy;  Laterality: N/A;   ESOPHAGOGASTRODUODENOSCOPY N/A 03/26/2013   Procedure:  ESOPHAGOGASTRODUODENOSCOPY (EGD);  Surgeon: Hilarie Fredrickson, MD;  Location: [facility] ENDOSCOPY;  Service: Endoscopy;  Laterality: N/A;   ESOPHAGOGASTRODUODENOSCOPY N/A 04/30/2015   Procedure: ESOPHAGOGASTRODUODENOSCOPY (EGD);  Surgeon: Malissa Hippo, MD;  [facility];  Service: Endoscopy;  Laterality: N/A;  1pm - moved to 10/20 @ 1:10   ESOPHAGOGASTRODUODENOSCOPY N/A 07/29/2016   Procedure: ESOPHAGOGASTRODUODENOSCOPY (EGD);  Surgeon: Ruffin Frederick, MD;  Location: [name] ENDOSCOPY;  Service: Gastroenterology;  Laterality: N/A;  enteroscopy   ESOPHAGOGASTRODUODENOSCOPY N/A 09/26/2019   Procedure: ESOPHAGOGASTRODUODENOSCOPY (EGD);  Surgeon: Malissa Hippo, MD;  [facility];  Service: Endoscopy;  Laterality: N/A;  1250   ESOPHAGOGASTRODUODENOSCOPY N/A 08/11/2022   Procedure: ESOPHAGOGASTRODUODENOSCOPY (EGD);  Surgeon: Beverley Fiedler, MD;  Location: [facility] ENDOSCOPY;  Service: Gastroenterology;  Laterality: N/A;   ESOPHAGOGASTRODUODENOSCOPY (EGD) WITH PROPOFOL N/A 02/05/2021   Procedure: ESOPHAGOGASTRODUODENOSCOPY (EGD) WITH PROPOFOL;  Surgeon: Lanelle Bal, DO;  [facility];  Service: Endoscopy;  Laterality: N/A;   ESOPHAGOGASTRODUODENOSCOPY (EGD) WITH PROPOFOL N/A 08/27/2022  Procedure: ESOPHAGOGASTRODUODENOSCOPY (EGD) WITH PROPOFOL;  Surgeon: Lanelle Bal, DO;  [facility];  Service: Endoscopy;  Laterality: N/A;   GIVENS CAPSULE STUDY N/A 03/07/2019   Procedure: GIVENS CAPSULE STUDY;  Surgeon: Malissa Hippo, MD;  [facility];  Service: Endoscopy;  Laterality: N/A;  7:30   GIVENS CAPSULE STUDY N/A 04/22/2021   Procedure: GIVENS CAPSULE STUDY;  Surgeon: Malissa Hippo, MD;  [facility];  Service: Endoscopy;  Laterality: N/A;  7:30   GIVENS CAPSULE STUDY N/A 08/27/2022   Procedure: GIVENS CAPSULE STUDY;  Surgeon: Lanelle Bal, DO;  [facility];  Service: Endoscopy;  Laterality: N/A;   HOT HEMOSTASIS N/A 08/11/2022    Procedure: HOT HEMOSTASIS (ARGON PLASMA COAGULATION/BICAP);  Surgeon: Beverley Fiedler, MD;  Location: [facility] ENDOSCOPY;  Service: Gastroenterology;  Laterality: N/A;   HOT HEMOSTASIS N/A 12/01/2022   Procedure: HOT HEMOSTASIS (ARGON PLASMA COAGULATION/BICAP);  Surgeon: Benancio Deeds, MD;  Location: [facility] ENDOSCOPY;  Service: Gastroenterology;  Laterality: N/A;   INSERTION OF DIALYSIS CATHETER N/A 10/05/2020   Procedure: ABORTED TUNNELED DIALYSIS CATHETER PLACEMENT RIGHT INTERNAL JUGULAR VEIN ;  Surgeon: Lucretia Roers, MD;  Location: [facility] ORS;  Service: General;  Laterality: N/A;   INTRAOPERATIVE TRANSESOPHAGEAL ECHOCARDIOGRAM  06/13/2012   Procedure: INTRAOPERATIVE TRANSESOPHAGEAL ECHOCARDIOGRAM;  Surgeon: Delight Ovens, MD;  Location: [facility] OR;  Service: Open Heart Surgery;  Laterality: N/A;   IR DIALY SHUNT INTRO NEEDLE/INTRACATH INITIAL W/IMG LEFT Left 10/06/2020   IR FLUORO GUIDE CV LINE RIGHT  06/17/2020   IR FLUORO GUIDE CV LINE RIGHT  11/12/2022   IR GENERIC HISTORICAL  07/26/2016   IR FLUORO GUIDE CV LINE RIGHT 07/26/2016 Berdine Dance, MD [facility]   IR GENERIC HISTORICAL  07/26/2016   IR US GUIDE VASC ACCESS RIGHT 07/26/2016 Berdine Dance, MD [facility]   IR GENERIC HISTORICAL  08/02/2016   IR US GUIDE VASC ACCESS RIGHT 08/02/2016 Berdine Dance, MD [facility]   IR GENERIC HISTORICAL  08/02/2016   IR FLUORO GUIDE CV LINE RIGHT 08/02/2016 Berdine Dance, MD [facility]   IR RADIOLOGY PERIPHERAL GUIDED IV START  03/28/2017   IR REMOVAL TUN CV CATH W/O FL  08/11/2020   IR REMOVAL TUN CV CATH W/O FL  11/15/2022   IR THROMBECTOMY AV FISTULA W/THROMBOLYSIS INC/SHUNT/IMG LEFT Left 06/17/2020   IR US GUIDE VASC ACCESS LEFT  06/17/2020   IR US GUIDE VASC ACCESS RIGHT  03/28/2017   IR US GUIDE VASC ACCESS RIGHT  06/17/2020   IR US GUIDE VASC ACCESS RIGHT  11/12/2022   LEFT HEART CATH AND CORONARY ANGIOGRAPHY N/A 09/20/2016   Procedure: Left Heart Cath and Coronary Angiography;  Surgeon: Lyn Records,  MD;  Location: [facility] INVASIVE CV LAB;  Service: Cardiovascular;  Laterality: N/A;   LEFT HEART CATH AND CORS/GRAFTS ANGIOGRAPHY N/A 10/13/2016   Procedure: Left Heart Cath and Cors/Grafts Angiography;  Surgeon: Lennette Bihari, MD;  [facility]: [facility] INV[facility] CV LAB;  Service: Cardiovascular;  Laterality: N/A;   LEFT HEART CATH AND CORS/GRAFTS ANGIOGRAPHY N/A 07/13/2018   Procedure: LEFT HEART CATH AND CORS/GRAFTS ANGIOGRAPHY;  Surgeon: Swaziland, Peter M, MD;  Location: [facility] INVASIVE CV LAB;  Service: Cardiovascular;  Laterality: N/A;   LEFT HEART CATH AND CORS/GRAFTS ANGIOGRAPHY N/A 07/22/2021   Procedure: LEFT HEART CATH AND CORS/GRAFTS ANGIOGRAPHY;  Surgeon: Runell Gess, MD;  [facility]: [facility] INVASIVE CV LAB;  Service: Cardiovascular;  Laterality: N/A;   LEFT HEART CATHETERIZATION WITH CORONARY  ANGIOGRAM N/A 12/15/2011   Procedure: LEFT HEART CATHETERIZATION WITH CORONARY ANGIOGRAM;  Surgeon: Kathleene Hazel, MD;  Location: [facility] CATH LAB;  Service: Cardiovascular;  Laterality: N/A;   LEFT HEART CATHETERIZATION WITH CORONARY ANGIOGRAM N/A 01/10/2012   Procedure: LEFT HEART CATHETERIZATION WITH CORONARY ANGIOGRAM;  Surgeon: Peter M Swaziland, MD;  Location: [facility] CATH LAB;  Service: Cardiovascular;  Laterality: N/A;   LEFT HEART CATHETERIZATION WITH CORONARY ANGIOGRAM N/A 06/08/2012   Procedure: LEFT HEART CATHETERIZATION WITH CORONARY ANGIOGRAM;  Surgeon: Kathleene Hazel, MD;  Location: [facility] CATH LAB;  Service: Cardiovascular;  Laterality: N/A;   LEFT HEART CATHETERIZATION WITH CORONARY/GRAFT ANGIOGRAM N/A 12/10/2013   Procedure: LEFT HEART CATHETERIZATION WITH Isabel Caprice;  Surgeon: Corky Crafts, MD;  Location: [facility] CATH LAB;  Service: Cardiovascular;  Laterality: N/A;   OVARY SURGERY     ovarian cancer   POLYPECTOMY  03/09/2019   Procedure: POLYPECTOMY;  Surgeon: Malissa Hippo, MD;  [facility];  Service: Endoscopy;;  cecal    POLYPECTOMY N/A 09/26/2019   Procedure: DUODENAL  POLYPECTOMY;  Surgeon: Malissa Hippo, MD;  [facility];  Service: Endoscopy;  Laterality: N/A;   POLYPECTOMY  08/11/2022   Procedure: POLYPECTOMY;  Surgeon: Beverley Fiedler, MD;  Location: [facility] ENDOSCOPY;  Service: Gastroenterology;;   REVISION OF ARTERIOVENOUS GORETEX GRAFT N/A 02/24/2017   Procedure: REVISION OF ARTERIOVENOUS GORETEX GRAFT (RESECTION);  Surgeon: Renford Dills, MD;  Location: [facility] ORS;  Service: Vascular;  Laterality: N/A;   REVISON OF ARTERIOVENOUS FISTULA Left 06/19/2020   Procedure: REVISION OF LEFT UPPER ARM AV GRAFT WITH INTERPOSITION JUMP GRAFT USING GORE LIMB;  Surgeon: Cephus Shelling, MD;  Location: [facility] OR;  Service: Vascular;  Laterality: Left;   SHUNTOGRAM N/A 10/15/2013   Procedure: Fistulogram;  Surgeon: Nada Libman, MD;  Location: [facility] CATH LAB;  Service: Cardiovascular;  Laterality: N/A;   SUBMUCOSAL TATTOO INJECTION  11/01/2022   Procedure: SUBMUCOSAL TATTOO INJECTION;  Surgeon: Dolores Frame, MD;  [facility];  Service: Gastroenterology;;   THROMBECTOMY / ARTERIOVENOUS GRAFT REVISION  2011   left upper arm   TUBAL LIGATION  1980's   UPPER EXTREMITY ANGIOGRAPHY Bilateral 12/06/2016   Procedure: Upper Extremity Angiography;  Surgeon: Renford Dills, MD;  Location: [facility] INVASIVE CV LAB;  Service: Cardiovascular;  Laterality: Bilateral;   UPPER EXTREMITY INTERVENTION Left 06/06/2017   Procedure: UPPER EXTREMITY INTERVENTION;  Surgeon: Renford Dills, MD;  Location: [facility] INVASIVE CV LAB;  Service: Cardiovascular;  Laterality: Left;   UPPER EXTREMITY VENOGRAPHY Left 10/04/2022   Procedure: UPPER EXTREMITY VENOGRAPHY;  Surgeon: Renford Dills, MD;  Location: [facility] INVASIVE CV LAB;  Service: Cardiovascular;  Laterality: Left;   Family History:   Family History  Problem Relation Age of Onset   Heart disease Mother        Heart Disease before age 16   Hyperlipidemia Mother    Hypertension Mother    Diabetes  Mother    Heart attack Mother    Heart disease Father        Heart Disease before age 5   Hyperlipidemia Father    Hypertension Father    Diabetes Father    Diabetes Sister    Hypertension Sister    Diabetes Brother    Hyperlipidemia Brother    Heart attack Brother    Hypertension Sister    Heart attack Brother    Colon cancer Child 62   Other Other  noncontributory for early CAD   Esophageal cancer Neg Hx    Liver disease Neg Hx    Kidney disease Neg Hx    Colon polyps Neg Hx    Social History:  reports that she has never smoked. She has never used smokeless tobacco. She reports that she does not drink alcohol and does not use drugs. Allergies  Allergen Reactions   Amlodipine Swelling   Aspirin Other (See Comments)    High Doses Mess up her stomach; "makes my bowels have blood in them". Takes 81 mg EC Aspirin    Nitrofurantoin Hives   Ranexa [Ranolazine] Other (See Comments)    Myoclonus-hospitalized    Bactrim [Sulfamethoxazole-Trimethoprim] Rash   Iodinated Contrast Media Itching and Other (See Comments)   Iron Itching and Other (See Comments)    "they gave me iron in dialysis; had to give me Benadryl cause I had to have the iron" (05/02/2012)   Tylenol [Acetaminophen] Itching and Other (See Comments)    Makes her feet on fire per pt - can tolerate per pt   Gabapentin Other (See Comments)    Unknown reaction   Sucroferric Oxyhydroxide Other (See Comments)    Unknown   Levaquin [Levofloxacin In D5w] Rash   Levofloxacin Rash and Other (See Comments)   Plavix [Clopidogrel Bisulfate] Rash   Protonix [Pantoprazole Sodium] Rash   Venofer [Ferric Oxide] Itching and Other (See Comments)    Patient reports using Benadryl prior to doses as [facility]   Prior to Admission medications   Medication Sig Start Date End Date Taking? Authorizing Provider  ALPRAZolam Prudy Feeler) 0.5 MG tablet Take 1 tablet (0.5 mg total) by mouth daily as needed for up to 4 doses for  anxiety. 12/19/22  Yes Lanae Boast, MD  amiodarone (PACERONE) 100 MG tablet Take 1 tablet (100 mg total) by mouth daily. 02/02/23 03/08/23 Yes Shah, Pratik D, DO  benzonatate (TESSALON) 100 MG capsule Take 1 capsule (100 mg total) by mouth 3 (three) times daily as needed for cough. 01/18/23  Yes Tilden Fossa, MD  calcitRIOL (ROCALTROL) 0.25 MCG capsule Take 7 capsules (1.75 mcg total) by mouth every Monday, Wednesday, and Friday with hemodialysis. 12/21/22  Yes Lanae Boast, MD  cinacalcet (SENSIPAR) 30 MG tablet Take 1 tablet (30 mg total) by mouth every Monday, Wednesday, and Friday with hemodialysis. 12/19/22  Yes Lanae Boast, MD  cyanocobalamin (VITAMIN B12) 1000 MCG/ML injection Inject 1,000 mcg into the muscle every 30 (thirty) days.   Yes [provider]  cyclobenzaprine (FLEXERIL) 5 MG tablet Take 5 mg by mouth 3 (three) times daily as needed.   Yes [provider]  folic acid (FOLVITE) 1 MG tablet Take 1 mg by mouth daily. 05/24/22  Yes [provider]  gabapentin (NEURONTIN) 100 MG capsule Take 1 capsule (100 mg total) by mouth every evening. 12/13/22  Yes Jerald Kief, MD  guaiFENesin-codeine 100-10 MG/5ML syrup Take 5 mLs by mouth every 4 (four) hours as needed for cough. 10/23/22  Yes Jacalyn Lefevre, MD  hydrALAZINE (APRESOLINE) 25 MG tablet Take 1 tablet (25 mg total) by mouth 2 (two) times daily. Hold for sbp <110 12/27/22  Yes Kc, Dayna Barker, MD  hydrOXYzine (ATARAX) 25 MG tablet Take 25 mg by mouth every 6 (six) hours as needed for itching. 06/06/22  Yes [provider]  levETIRAcetam (KEPPRA) 500 MG tablet Take 1 tablet (500 mg total) by mouth daily. 11/15/22  Yes Leroy Sea, MD  levothyroxine (SYNTHROID) 75 MCG  tablet Take 1 tablet (75 mcg total) by mouth daily at 6 (six) AM. 02/02/23  Yes Sherryll Burger, Pratik D, DO  metoprolol tartrate (LOPRESSOR) 25 MG tablet Take 0.5 tablets (12.5 mg total) by mouth 2 (two) times daily. 02/01/23 03/08/23 Yes Shah, Pratik D, DO   multivitamin (RENA-VIT) TABS tablet Take 1 tablet by mouth daily.   Yes [provider]  nitroGLYCERIN (NITROSTAT) 0.4 MG SL tablet Place 0.4 mg under the tongue every 5 (five) minutes as needed for chest pain.   Yes [provider]  olmesartan (BENICAR) 20 MG tablet Take 20 mg by mouth daily. 01/14/23  Yes [provider]  omeprazole (PRILOSEC) 20 MG capsule Take 1 capsule (20 mg total) by mouth 2 (two) times daily before a meal. Patient taking differently: Take 20 mg by mouth as needed (acid reflux). 02/01/23 03/08/23 Yes Shah, Pratik D, DO  ondansetron (ZOFRAN) 4 MG tablet Take 4 mg by mouth every 8 (eight) hours as needed. 02/27/23  Yes [provider]  rosuvastatin (CRESTOR) 10 MG tablet Take 1 tablet by mouth once daily 06/06/22  Yes Strader, [place] M, PA-C  octreotide (SANDOSTATIN LAR) 20 MG injection Inject 20 mg into the muscle every 28 (twenty-eight) days. Patient not taking: Reported on 03/08/2023 11/15/22   Leroy Sea, MD   Current Facility-Administered Medications  Medication Dose Route Frequency Provider Last Rate Last Admin   0.9 %  sodium chloride infusion (Manually program via Guardrails IV Fluids)   Intravenous Once Elgergawy, Leana Roe, MD       ALPRAZolam Prudy Feeler) tablet 0.5 mg  0.5 mg Oral Daily PRN Elgergawy, Leana Roe, MD       amiodarone (PACERONE) tablet 100 mg  100 mg Oral Daily Elgergawy, Leana Roe, MD       benzonatate (TESSALON) capsule 100 mg  100 mg Oral TID PRN Elgergawy, Leana Roe, MD       [START ON 03/10/2023] calcitRIOL (ROCALTROL) capsule 1.75 mcg  1.75 mcg Oral Q M,W,F-HD Elgergawy, Leana Roe, MD       [START ON 03/10/2023] cinacalcet (SENSIPAR) tablet 30 mg  30 mg Oral Q M,W,F-HD Elgergawy, Leana Roe, MD       cyanocobalamin (VITAMIN B12) injection 1,000 mcg  1,000 mcg Intramuscular Q30 days Elgergawy, Leana Roe, MD       cyclobenzaprine (FLEXERIL) tablet 5 mg  5 mg Oral TID PRN Elgergawy, Leana Roe, MD       folic acid (FOLVITE)  tablet 1 mg  1 mg Oral Daily Elgergawy, Leana Roe, MD       gabapentin (NEURONTIN) capsule 100 mg  100 mg Oral QPM Elgergawy, Leana Roe, MD   100 mg at 03/08/23 1905   guaiFENesin-codeine 100-10 MG/5ML solution 5 mL  5 mL Oral Q4H PRN Elgergawy, Leana Roe, MD       hydrALAZINE (APRESOLINE) injection 5 mg  5 mg Intravenous Q4H PRN Elgergawy, Leana Roe, MD       hydrALAZINE (APRESOLINE) tablet 25 mg  25 mg Oral BID Elgergawy, Leana Roe, MD   25 mg at 03/08/23 2105   hydrOXYzine (ATARAX) tablet 25 mg  25 mg Oral Q6H PRN Elgergawy, Leana Roe, MD       irbesartan (AVAPRO) tablet 37.5 mg  37.5 mg Oral Daily Elgergawy, Leana Roe, MD       levETIRAcetam (KEPPRA) tablet 500 mg  500 mg Oral Daily Elgergawy, Leana Roe, MD       levothyroxine (SYNTHROID) tablet 75 mcg  75 mcg  Oral [id number] Elgergawy, Leana Roe, MD   75 mcg at 03/09/23 0503   metoprolol tartrate (LOPRESSOR) tablet 12.5 mg  12.5 mg Oral BID Elgergawy, Leana Roe, MD       multivitamin (RENA-VIT) tablet 1 tablet  1 tablet Oral Daily Elgergawy, Leana Roe, MD       nitroGLYCERIN (NITROSTAT) SL tablet 0.4 mg  0.4 mg Sublingual Q5 min PRN Elgergawy, Leana Roe, MD       octreotide (SANDOSTATIN) injection 100 mcg  100 mcg Intravenous Q12H Elgergawy, Leana Roe, MD   100 mcg at 03/09/23 0503   ondansetron (ZOFRAN) tablet 4 mg  4 mg Oral Q8H PRN Elgergawy, Leana Roe, MD       pantoprazole (PROTONIX) EC tablet 40 mg  40 mg Oral Daily Elgergawy, Leana Roe, MD   40 mg at 03/08/23 1905   rosuvastatin (CRESTOR) tablet 10 mg  10 mg Oral QHS Elgergawy, Leana Roe, MD   10 mg at 03/08/23 2104   Current Outpatient Medications  Medication Sig Dispense Refill   ALPRAZolam (XANAX) 0.5 MG tablet Take 1 tablet (0.5 mg total) by mouth daily as needed for up to 4 doses for anxiety. 4 tablet 0   amiodarone (PACERONE) 100 MG tablet Take 1 tablet (100 mg total) by mouth daily. 30 tablet 0   benzonatate (TESSALON) 100 MG capsule Take 1 capsule (100 mg total) by mouth 3 (three) times daily as  needed for cough. 21 capsule 0   calcitRIOL (ROCALTROL) 0.25 MCG capsule Take 7 capsules (1.75 mcg total) by mouth every Monday, Wednesday, and Friday with hemodialysis.     cinacalcet (SENSIPAR) 30 MG tablet Take 1 tablet (30 mg total) by mouth every Monday, Wednesday, and Friday with hemodialysis. 60 tablet    cyanocobalamin (VITAMIN B12) 1000 MCG/ML injection Inject 1,000 mcg into the muscle every 30 (thirty) days.     cyclobenzaprine (FLEXERIL) 5 MG tablet Take 5 mg by mouth 3 (three) times daily as needed.     folic acid (FOLVITE) 1 MG tablet Take 1 mg by mouth daily.     gabapentin (NEURONTIN) 100 MG capsule Take 1 capsule (100 mg total) by mouth every evening. 5 capsule 0   guaiFENesin-codeine 100-10 MG/5ML syrup Take 5 mLs by mouth every 4 (four) hours as needed for cough. 120 mL 0   hydrALAZINE (APRESOLINE) 25 MG tablet Take 1 tablet (25 mg total) by mouth 2 (two) times daily. Hold for sbp <110     hydrOXYzine (ATARAX) 25 MG tablet Take 25 mg by mouth every 6 (six) hours as needed for itching.     levETIRAcetam (KEPPRA) 500 MG tablet Take 1 tablet (500 mg total) by mouth daily. 30 tablet 0   levothyroxine (SYNTHROID) 75 MCG tablet Take 1 tablet (75 mcg total) by mouth daily at 6 (six) AM. 30 tablet 0   metoprolol tartrate (LOPRESSOR) 25 MG tablet Take 0.5 tablets (12.5 mg total) by mouth 2 (two) times daily. 30 tablet 0   multivitamin (RENA-VIT) TABS tablet Take 1 tablet by mouth daily.     nitroGLYCERIN (NITROSTAT) 0.4 MG SL tablet Place 0.4 mg under the tongue every 5 (five) minutes as needed for chest pain.     olmesartan (BENICAR) 20 MG tablet Take 20 mg by mouth daily.     omeprazole (PRILOSEC) 20 MG capsule Take 1 capsule (20 mg total) by mouth 2 (two) times daily before a meal. (Patient taking differently: Take 20 mg by mouth as needed (acid reflux).) 60  capsule 0   ondansetron (ZOFRAN) 4 MG tablet Take 4 mg by mouth every 8 (eight) hours as needed.     rosuvastatin (CRESTOR) 10 MG  tablet Take 1 tablet by mouth once daily 90 tablet 0   octreotide (SANDOSTATIN LAR) 20 MG injection Inject 20 mg into the muscle every 28 (twenty-eight) days. (Patient not taking: Reported on 03/08/2023) 1 each 0   Labs: Basic Metabolic Panel: Recent Labs  Lab 03/08/23 1435 03/09/23 0331  NA 137 137  K 3.5 4.2  CL 99 99  CO2 27 24  GLUCOSE 108* 135*  BUN 35* 38*  CREATININE 6.03* 6.31*  CALCIUM 8.7* 8.7*   Liver Function Tests: Recent Labs  Lab 03/08/23 1435  AST 8*  ALT 8  ALKPHOS 71  BILITOT 0.8  PROT 5.7*  ALBUMIN 2.7*   No results for input(s): "LIPASE", "AMYLASE" in the last 168 hours. No results for input(s): "AMMONIA" in the last 168 hours. CBC: Recent Labs  Lab 03/08/23 1435 03/09/23 0331  WBC 5.5 9.4  NEUTROABS 4.1  --   HGB 7.0* 7.2*  HCT 22.1* 22.5*  MCV 114.5* 114.8*  PLT ACLMP 255   Cardiac Enzymes: No results for input(s): "CKTOTAL", "CKMB", "CKMBINDEX", "TROPONINI" in the last 168 hours. CBG: No results for input(s): "GLUCAP" in the last 168 hours. Iron Studies: No results for input(s): "IRON", "TIBC", "TRANSFERRIN", "FERRITIN" in the last 72 hours. Studies/Results: No results found.  ROS: Pertinent items are noted in HPI. Physical Exam: Vitals:   03/09/23 0215 03/09/23 0300 03/09/23 0620 03/09/23 0700  BP: (!) 168/49 (!) 178/62 (!) 166/50 (!) 176/58  Pulse: 61 61 (!) 53 (!) 54  Resp: (!) 22 20 17 18   Temp: 98.8 F (37.1 C)  98.5 F (36.9 C)   TempSrc:      SpO2: 100% 100% 100% 100%  Weight:      Height:          Weight change:  No intake or output data in the 24 hours ending 03/09/23 1011 BP (!) 176/58   Pulse (!) 54   Temp 98.5 F (36.9 C)   Resp 18   Ht 5\' 2"  (1.575 m)   Wt 56 kg   SpO2 100%   BMI 22.58 kg/m  General appearance: alert, cooperative, and no distress Head: Normocephalic, without obvious abnormality, atraumatic Resp: clear to auscultation bilaterally Cardio: regular rate and rhythm, S1, S2 normal, no  murmur, click, rub or gallop GI: soft, non-tender; bowel sounds normal; no masses,  no organomegaly Extremities: extremities normal, atraumatic, no cyanosis or edema Dialysis Access:  Dialysis Orders: MWF at [facility] EDW 58.5kg TDC 2K 2.5Ca BFR 300 DFR 500 Calcitrol 1.51mcg PO q HD Sensipar 30mg  PO q HD Mircera 200 mcg IV q 2 weeks- Last dose 11/28/22  Assessment/Plan:  Symptomatic anemia - pt with chronic GIB due to AVM's and multiple admissions for blood transfusions.  She received 1 unit yesterday without much change.  Will plan to transfuse 2 units of PRBC's with HD today.  ESRD -  off schedule.  To have HD today and hopefully discharge to home and follow up with outpatient HD tomorrow to get back on schedule.  Hypertension/volume  - UF as tolerated with HD.  She is actually under her edw.  Anemia  - as above, for blood transfusion today with HD  Metabolic bone disease -   continue with home meds  Nutrition -  renal diet  Disposition - hopeful discharge to home  after HD.   Irena Cords, MD [facility], [facility] Pager [phone] 03/09/2023, 10:11 AM

## 2023-03-10 DIAGNOSIS — D649 Anemia, unspecified: Secondary | ICD-10-CM | POA: Diagnosis not present

## 2023-03-10 DIAGNOSIS — N186 End stage renal disease: Secondary | ICD-10-CM | POA: Diagnosis not present

## 2023-03-10 DIAGNOSIS — D638 Anemia in other chronic diseases classified elsewhere: Secondary | ICD-10-CM

## 2023-03-10 DIAGNOSIS — E039 Hypothyroidism, unspecified: Secondary | ICD-10-CM | POA: Diagnosis not present

## 2023-03-10 DIAGNOSIS — D62 Acute posthemorrhagic anemia: Secondary | ICD-10-CM | POA: Diagnosis not present

## 2023-03-10 LAB — TYPE AND SCREEN
ABO/RH(D): O POS
Antibody Screen: NEGATIVE
Donor AG Type: NEGATIVE
Donor AG Type: NEGATIVE
Unit division: 0
Unit division: 0

## 2023-03-10 LAB — CBC
HCT: 31 % — ABNORMAL LOW (ref 36.0–46.0)
Hemoglobin: 10.3 g/dL — ABNORMAL LOW (ref 12.0–15.0)
MCH: 33 pg (ref 26.0–34.0)
MCHC: 33.2 g/dL (ref 30.0–36.0)
MCV: 99.4 fL (ref 80.0–100.0)
Platelets: 242 10*3/uL (ref 150–400)
RBC: 3.12 MIL/uL — ABNORMAL LOW (ref 3.87–5.11)
RDW: 26.2 % — ABNORMAL HIGH (ref 11.5–15.5)
WBC: 6.7 10*3/uL (ref 4.0–10.5)
nRBC: 0 % (ref 0.0–0.2)

## 2023-03-10 LAB — BPAM RBC
Blood Product Expiration Date: 202409242359
Blood Product Expiration Date: 202409302359
ISSUE DATE / TIME: 202408291659
ISSUE DATE / TIME: 202408291801
Unit Type and Rh: 5100
Unit Type and Rh: 5100

## 2023-03-10 LAB — BASIC METABOLIC PANEL
Anion gap: 10 (ref 5–15)
BUN: 19 mg/dL (ref 8–23)
CO2: 28 mmol/L (ref 22–32)
Calcium: 8.4 mg/dL — ABNORMAL LOW (ref 8.9–10.3)
Chloride: 95 mmol/L — ABNORMAL LOW (ref 98–111)
Creatinine, Ser: 4.07 mg/dL — ABNORMAL HIGH (ref 0.44–1.00)
GFR, Estimated: 10 mL/min — ABNORMAL LOW (ref >60)
Glucose, Bld: 122 mg/dL — ABNORMAL HIGH (ref 70–99)
Potassium: 3 mmol/L — ABNORMAL LOW (ref 3.5–5.1)
Sodium: 133 mmol/L — ABNORMAL LOW (ref 135–145)

## 2023-03-10 LAB — HEPATITIS B SURFACE ANTIBODY, QUANTITATIVE: Hep B S AB Quant (Post): 185 m[IU]/mL

## 2023-03-10 MED ORDER — POTASSIUM CHLORIDE CRYS ER 20 MEQ PO TBCR
20.0000 meq | EXTENDED_RELEASE_TABLET | Freq: Once | ORAL | Status: AC
  Administered 2023-03-10: 20 meq via ORAL
  Filled 2023-03-10: qty 1

## 2023-03-10 NOTE — Progress Notes (Signed)
Pt able to stand and get in wheelchair. DC instructions reviewed with patient and husband who will be taking her to dr apt and then home.

## 2023-03-10 NOTE — Plan of Care (Signed)

## 2023-03-10 NOTE — Progress Notes (Signed)
   Nephrology Nursing Note:  Morning labs were drawn from pt's [facility] by HD nurse.  Also, Octreotide 100 mcg was given as pt has no PIV.  [facility] can accommodate pt tomorrow at 1030 for off-schedule HD.  Can resume regular MWF schedule next week.  Above d/w Dr. Sharl Ma.   Arman Filter, RN AP [facility]

## 2023-03-10 NOTE — Discharge Summary (Signed)
Physician Discharge Summary   Patient: Shawna [name] MRN: [id number] DOB: [birth year]  Admit date:     03/08/2023  Discharge date: 03/10/23  Discharge Physician: [name]   PCP: [facility]   Recommendations at discharge:   Patient will get hemodialysis as outpatient tomorrow Follow-up PCP as outpatient  Discharge Diagnoses: Active Problems:   Acute on chronic blood loss anemia   End-stage renal disease on hemodialysis (HCC)   Acquired hypothyroidism   CAD (coronary artery disease)   Anemia associated with chronic renal failure   Atrial fibrillation, chronic (HCC)   Angiodysplasia of duodenum   Anemia of chronic disease   Symptomatic anemia  Resolved Problems:   * No resolved hospital problems. *  Hospital Course: 82 year old female with past medical history of hypertension, hyperlipidemia, CAD status post CABG and PCI, paroxysmal atrial fibrillation no longer on anticoagulation due to recurrent GI bleed, ESRD on HD, MWF, seizure disorder, hypothyroidism, recurrent GI bleed from AVM, currently on IM octreotide as outpatient who was sent to ED from HD center due to low hemoglobin of 6.5.  Patient also complained of chest pain.  1 unit PRBC was ordered last night however patient did not have IV access so did not get blood transfusion.   Assessment and Plan:  Symptomatic anemia -History of acute on chronic blood loss anemia due to history of AVMs -Anemia of chronic disease - status post 2 units PRBC, hemoglobin up to 10.2 this morning   Chest pain -Resolved -EKG obtained showed left bundle branch block, unchanged from previous -Troponin x 2 ordered -Chest pain resolved after blood pressure improved with antihypertensive medications -Patient got 2 units PRBC yesterday with hemodialysis   Chronic systolic CHF with EF 30-35% CAD status post CABG with residual small vessel disease -Stable   Hypertension -Continue home medications     Hyperlipidemia -Continue statin   Diabetes mellitus type 2 -Diet controlled   Hypothyroidism -Continue Synthroid  Hypokalemia -Potassium is 3.0 -Replace potassium today with 20 mEq KCl -She has dialysis session scheduled tomorrow as outpatient      Consultants: Nephrology Procedures performed: None Disposition: Home Diet recommendation:  Discharge Diet Orders (From admission, onward)     Start     Ordered   03/10/23 0000  Diet - low sodium heart healthy        03/10/23 1106           Regular diet DISCHARGE MEDICATION: Allergies as of 03/10/2023       Reactions   Amlodipine Swelling   Aspirin Other (See Comments)   High Doses Mess up her stomach; "makes my bowels have blood in them". Takes 81 mg EC Aspirin    Nitrofurantoin Hives   Ranexa [ranolazine] Other (See Comments)   Myoclonus-hospitalized    Bactrim [sulfamethoxazole-trimethoprim] Rash   Iodinated Contrast Media Itching, Other (See Comments)   Iron Itching, Other (See Comments)   "they gave me iron in dialysis; had to give me Benadryl cause I had to have the iron" (05/02/2012)   Tylenol [acetaminophen] Itching, Other (See Comments)   Makes her feet on fire per pt - can tolerate per pt   Gabapentin Other (See Comments)   Unknown reaction   Sucroferric Oxyhydroxide Other (See Comments)   Unknown   Levaquin [levofloxacin In D5w] Rash   Levofloxacin Rash, Other (See Comments)   Plavix [clopidogrel Bisulfate] Rash   Protonix [pantoprazole Sodium] Rash   Venofer [ferric Oxide] Itching, Other (See Comments)   Patient reports using  Benadryl prior to doses as [facility]        Medication List     TAKE these medications    ALPRAZolam 0.5 MG tablet Commonly known as: XANAX Take 1 tablet (0.5 mg total) by mouth daily as needed for up to 4 doses for anxiety.   amiodarone 100 MG tablet Commonly known as: PACERONE Take 1 tablet (100 mg total) by mouth daily.   benzonatate 100 MG  capsule Commonly known as: TESSALON Take 1 capsule (100 mg total) by mouth 3 (three) times daily as needed for cough.   calcitRIOL 0.25 MCG capsule Commonly known as: ROCALTROL Take 7 capsules (1.75 mcg total) by mouth every Monday, Wednesday, and Friday with hemodialysis.   cinacalcet 30 MG tablet Commonly known as: SENSIPAR Take 1 tablet (30 mg total) by mouth every Monday, Wednesday, and Friday with hemodialysis.   cyanocobalamin 1000 MCG/ML injection Commonly known as: VITAMIN B12 Inject 1,000 mcg into the muscle every 30 (thirty) days.   cyclobenzaprine 5 MG tablet Commonly known as: FLEXERIL Take 5 mg by mouth 3 (three) times daily as needed.   folic acid 1 MG tablet Commonly known as: FOLVITE Take 1 mg by mouth daily.   gabapentin 100 MG capsule Commonly known as: NEURONTIN Take 1 capsule (100 mg total) by mouth every evening.   guaiFENesin-codeine 100-10 MG/5ML syrup Take 5 mLs by mouth every 4 (four) hours as needed for cough.   hydrALAZINE 25 MG tablet Commonly known as: APRESOLINE Take 1 tablet (25 mg total) by mouth 2 (two) times daily. Hold for sbp <110   hydrOXYzine 25 MG tablet Commonly known as: ATARAX Take 25 mg by mouth every 6 (six) hours as needed for itching.   levETIRAcetam 500 MG tablet Commonly known as: KEPPRA Take 1 tablet (500 mg total) by mouth daily.   levothyroxine 75 MCG tablet Commonly known as: SYNTHROID Take 1 tablet (75 mcg total) by mouth daily at 6 (six) AM.   metoprolol tartrate 25 MG tablet Commonly known as: LOPRESSOR Take 0.5 tablets (12.5 mg total) by mouth 2 (two) times daily.   multivitamin Tabs tablet Take 1 tablet by mouth daily.   nitroGLYCERIN 0.4 MG SL tablet Commonly known as: NITROSTAT Place 0.4 mg under the tongue every 5 (five) minutes as needed for chest pain.   olmesartan 20 MG tablet Commonly known as: BENICAR Take 20 mg by mouth daily.   omeprazole 20 MG capsule Commonly known as: PRILOSEC Take 1  capsule (20 mg total) by mouth 2 (two) times daily before a meal. What changed:  when to take this reasons to take this   ondansetron 4 MG tablet Commonly known as: ZOFRAN Take 4 mg by mouth every 8 (eight) hours as needed.   rosuvastatin 10 MG tablet Commonly known as: CRESTOR Take 1 tablet by mouth once daily   SandoSTATIN LAR [place] 20 MG injection Generic drug: octreotide Inject 20 mg into the muscle every 28 (twenty-eight) days.        Discharge Exam: Filed Weights   03/08/23 1403 03/09/23 1200 03/09/23 1545  Weight: 56 kg 56.1 kg 56.2 kg   General-appears in no acute distress Heart-S1-S2, regular, no murmur auscultated Lungs-clear to auscultation bilaterally, no wheezing or crackles auscultated Abdomen-soft, nontender, no organomegaly Extremities-no edema in the lower extremities Neuro-alert, oriented x3, no focal deficit noted  Condition at discharge: good  The results of significant diagnostics from this hospitalization (including imaging, microbiology, ancillary and laboratory) are listed below for reference.  Imaging Studies: CT Head Wo Contrast  Result Date: 02/10/2023 CLINICAL DATA:  Mental status change, unknown cause EXAM: CT HEAD WITHOUT CONTRAST TECHNIQUE: Contiguous axial images were obtained from the base of the skull through the vertex without intravenous contrast. RADIATION DOSE REDUCTION: This exam was performed according to the departmental dose-optimization program which includes automated exposure control, adjustment of the mA and/or kV according to patient size and/or use of iterative reconstruction technique. COMPARISON:  CT head 01/31/2023. FINDINGS: Brain: No evidence of acute large vascular territory infarction, hemorrhage, hydrocephalus, sizable extra-axial collection or mass lesion/mass effect. Patchy white matter hypodensities are nonspecific but compatible with chronic microvascular ischemic change. Vascular: No hyperdense vessel identified.  Skull: No acute fracture. Sinuses/Orbits: Clear sinuses.  No acute orbital findings. Other: No mastoid effusions. IMPRESSION: No evidence of acute intracranial abnormality. Electronically Signed   By: Feliberto Harts M.D.   On: 02/10/2023 18:00   DG Chest Portable 1 View  Result Date: 02/10/2023 CLINICAL DATA:  Altered mental status. EXAM: PORTABLE CHEST 1 VIEW COMPARISON:  Chest x-ray dated January 17, 2023. FINDINGS: Unchanged tunneled left internal jugular dialysis catheter. Stable cardiomegaly status post CABG. Chronic small left pleural effusion and left basilar atelectasis. New mild subsegmental atelectasis at the right lung base. No pneumothorax. No acute osseous abnormality. Left brachial stent again noted. IMPRESSION: 1. Chronic small left pleural effusion and left basilar atelectasis. 2. New mild right basilar subsegmental atelectasis. Electronically Signed   By: Obie Dredge M.D.   On: 02/10/2023 17:30    Microbiology: Results for orders placed or performed during the hospital encounter of 01/27/23  MRSA Next Gen by PCR, Nasal     Status: None   Collection Time: 01/28/23  6:05 PM   Specimen: Nasal Mucosa; Nasal Swab  Result Value Ref Range Status   MRSA by PCR Next Gen NOT DETECTED NOT DETECTED Final    Comment: (NOTE) The GeneXpert MRSA Assay (FDA approved for NASAL specimens only), is one component of a comprehensive MRSA colonization surveillance program. It is not intended to diagnose MRSA infection nor to guide or monitor treatment for MRSA infections. Test performance is not FDA approved in patients less than 29 years old. Performed at [facility], [address] [place], [place] [address]    *Note: Due to a large number of results and/or encounters for the requested time period, some results have not been displayed. A complete set of results can be found in Results Review.    Labs: CBC: Recent Labs  Lab 03/08/23 1435 03/09/23 0331 03/10/23 0830  WBC 5.5 9.4 6.7   NEUTROABS 4.1  --   --   HGB 7.0* 7.2* 10.3*  HCT 22.1* 22.5* 31.0*  MCV 114.5* 114.8* 99.4  PLT ACLMP 255 242   Basic Metabolic Panel: Recent Labs  Lab 03/08/23 1435 03/09/23 0331 03/10/23 0830  NA 137 137 133*  K 3.5 4.2 3.0*  CL 99 99 95*  CO2 27 24 28   GLUCOSE 108* 135* 122*  BUN 35* 38* 19  CREATININE 6.03* 6.31* 4.07*  CALCIUM 8.7* 8.7* 8.4*   Liver Function Tests: Recent Labs  Lab 03/08/23 1435  AST 8*  ALT 8  ALKPHOS 71  BILITOT 0.8  PROT 5.7*  ALBUMIN 2.7*   CBG: No results for input(s): "GLUCAP" in the last 168 hours.  Discharge time spent: greater than 30 minutes.  Signed: [name], MD [facility] 03/10/2023

## 2023-03-10 NOTE — TOC Transition Note (Signed)
Transition of Care [facility]) - CM/SW Discharge Note   Patient Details  Name: Shawna [name] MRN: [id number] Date of Birth: 1941/01/05  Transition of Care [facility]) CM/SW Contact:  Elliot Gault, LCSW Phone Number: 03/10/2023, 11:36 AM   Clinical Narrative:     Pt stable for dc per MD. Valorie Roosevelt Darl Pikes at [facility]. New orders and dc summary faxed.   No other TOC needs for dc.  Final next level of care: Home w Home Health Services Barriers to Discharge: Barriers Resolved   Patient Goals and CMS Choice      Discharge Placement                         Discharge Plan and Services Additional resources added to the After Visit Summary for   In-house Referral: Clinical Social Work   Post Acute Care Choice: Resumption of Svcs/PTA Provider                               Social Determinants of Health (SDOH) Interventions SDOH Screenings   Food Insecurity: No Food Insecurity (03/09/2023)  Housing: Low Risk  (03/09/2023)  Transportation Needs: No Transportation Needs (03/09/2023)  Utilities: Not At Risk (03/09/2023)  Financial Resource Strain: Low Risk  (12/14/2018)  Physical Activity: Unknown (12/14/2018)  Social Connections: Unknown (12/14/2018)  Stress: No Stress Concern Present (12/14/2018)  Tobacco Use: Low Risk  (03/08/2023)  Health Literacy: Inadequate Health Literacy (02/04/2023)   Received from [facility]     Readmission Risk Interventions    01/29/2023   11:08 AM 10/31/2022   12:45 PM 08/29/2022    4:47 PM  Readmission Risk Prevention Plan  Transportation Screening Complete Complete Complete  Medication Review Oceanographer) Complete Complete Complete  PCP or Specialist appointment within 3-5 days of discharge Complete  Complete  HRI or Home Care Consult Complete Complete Complete  SW Recovery Care/Counseling Consult Complete Complete Complete  Palliative Care Screening Not Applicable Not Applicable Not Applicable  Skilled Nursing Facility Not Applicable  Not Applicable Complete

## 2023-04-07 ENCOUNTER — Emergency Department (HOSPITAL_COMMUNITY): Payer: Medicare Other

## 2023-04-07 ENCOUNTER — Emergency Department (HOSPITAL_COMMUNITY)
Admission: EM | Admit: 2023-04-07 | Discharge: 2023-04-07 | Disposition: A | Discharge status: Home / Self Care | Payer: Medicare Other

## 2023-04-07 DIAGNOSIS — I2510 Atherosclerotic heart disease of native coronary artery without angina pectoris: Secondary | ICD-10-CM | POA: Diagnosis not present

## 2023-04-07 DIAGNOSIS — Z8543 Personal history of malignant neoplasm of ovary: Secondary | ICD-10-CM | POA: Insufficient documentation

## 2023-04-07 DIAGNOSIS — R55 Syncope and collapse: Secondary | ICD-10-CM | POA: Insufficient documentation

## 2023-04-07 DIAGNOSIS — D649 Anemia, unspecified: Secondary | ICD-10-CM | POA: Diagnosis not present

## 2023-04-07 DIAGNOSIS — Z992 Dependence on renal dialysis: Secondary | ICD-10-CM | POA: Diagnosis not present

## 2023-04-07 DIAGNOSIS — Z8673 Personal history of transient ischemic attack (TIA), and cerebral infarction without residual deficits: Secondary | ICD-10-CM | POA: Insufficient documentation

## 2023-04-07 DIAGNOSIS — Z79899 Other long term (current) drug therapy: Secondary | ICD-10-CM | POA: Diagnosis not present

## 2023-04-07 DIAGNOSIS — N186 End stage renal disease: Secondary | ICD-10-CM | POA: Diagnosis not present

## 2023-04-07 DIAGNOSIS — I509 Heart failure, unspecified: Secondary | ICD-10-CM | POA: Diagnosis not present

## 2023-04-07 LAB — COMPREHENSIVE METABOLIC PANEL
ALT: 14 U/L (ref 0–44)
AST: 15 U/L (ref 15–41)
Albumin: 2.4 g/dL — ABNORMAL LOW (ref 3.5–5.0)
Alkaline Phosphatase: 96 U/L (ref 38–126)
Anion gap: 11 (ref 5–15)
BUN: 18 mg/dL (ref 8–23)
CO2: 26 mmol/L (ref 22–32)
Calcium: 8.5 mg/dL — ABNORMAL LOW (ref 8.9–10.3)
Chloride: 99 mmol/L (ref 98–111)
Creatinine, Ser: 5.29 mg/dL — ABNORMAL HIGH (ref 0.44–1.00)
GFR, Estimated: 8 mL/min — ABNORMAL LOW (ref >60)
Glucose, Bld: 91 mg/dL (ref 70–99)
Potassium: 3.5 mmol/L (ref 3.5–5.1)
Sodium: 136 mmol/L (ref 135–145)
Total Bilirubin: 0.7 mg/dL (ref 0.3–1.2)
Total Protein: 5.9 g/dL — ABNORMAL LOW (ref 6.5–8.1)

## 2023-04-07 LAB — CBC WITH DIFFERENTIAL/PLATELET
Abs Immature Granulocytes: 0.02 10*3/uL (ref 0.00–0.07)
Basophils Absolute: 0.1 10*3/uL (ref 0.0–0.1)
Basophils Relative: 1 %
Eosinophils Absolute: 0.1 10*3/uL (ref 0.0–0.5)
Eosinophils Relative: 2 %
HCT: 29.5 % — ABNORMAL LOW (ref 36.0–46.0)
Hemoglobin: 9.2 g/dL — ABNORMAL LOW (ref 12.0–15.0)
Immature Granulocytes: 0 %
Lymphocytes Relative: 10 %
Lymphs Abs: 0.7 10*3/uL (ref 0.7–4.0)
MCH: 33.3 pg (ref 26.0–34.0)
MCHC: 31.2 g/dL (ref 30.0–36.0)
MCV: 106.9 fL — ABNORMAL HIGH (ref 80.0–100.0)
Monocytes Absolute: 0.5 10*3/uL (ref 0.1–1.0)
Monocytes Relative: 7 %
Neutro Abs: 5.8 10*3/uL (ref 1.7–7.7)
Neutrophils Relative %: 80 %
Platelets: 228 10*3/uL (ref 150–400)
RBC: 2.76 MIL/uL — ABNORMAL LOW (ref 3.87–5.11)
WBC: 7.3 10*3/uL (ref 4.0–10.5)
nRBC: 0 % (ref 0.0–0.2)

## 2023-04-07 NOTE — ED Notes (Signed)
Attempted IV access Unsuccessful Requested [place] IV placement

## 2023-04-07 NOTE — Discharge Instructions (Signed)
As discussed, recommend close follow-up with primary care for reassessment of your symptoms.  Follow-up for dialysis session tomorrow morning.  Please do not hesitate to return to emergency department for worrisome signs and symptoms we discussed become apparent.

## 2023-04-07 NOTE — ED Provider Notes (Signed)
[place] EMERGENCY DEPARTMENT AT [facility] Provider Note   CSN: [id number] Arrival date & time: 04/07/23  1330     History {Add pertinent medical, surgical, social history, OB history to HPI:1} Chief Complaint  Patient presents with   Near syncope    Shawna [name] is a 82 y.o. female.    82 year old female presents emergency department with complaints of near syncope.  Patient states that she was at dialysis center earlier today.  Patient states that she stopped dialysis temporarily and was wheelchair over to the bathroom when she stood up and felt lightheaded.  She subsequently walked into the restroom and went to sit down to use the restroom and felt lightheaded and collapsed to the floor.  Denies any chest pain or shortness of breath.  Does state that she hit her head against the wall but denies any loss of consciousness.  Denies any visual disturbance, gait abnormality, slurred speech, facial droop, weakness/sensory deficits in upper extremities.  States that she currently feels like her baseline.  Did not receive dialysis today.  Past medical history significant for acute on chronic respiratory failure, ovarian cancer, AV malformation of colon, CAD, CHF, ESRD on hemodialysis, GERD, mitral valve regurg, nonsustained ventricular tachycardia, paroxysmal atrial fibrillation, TIA, NSTEMI, cardiac arrest  Home Medications Prior to Admission medications   Medication Sig Start Date End Date Taking? Authorizing Provider  ALPRAZolam Prudy Feeler) 0.5 MG tablet Take 1 tablet (0.5 mg total) by mouth daily as needed for up to 4 doses for anxiety. 12/19/22   Lanae Boast, MD  amiodarone (PACERONE) 100 MG tablet Take 1 tablet (100 mg total) by mouth daily. 02/02/23 03/08/23  Sherryll Burger, Pratik D, DO  benzonatate (TESSALON) 100 MG capsule Take 1 capsule (100 mg total) by mouth 3 (three) times daily as needed for cough. 01/18/23   Tilden Fossa, MD  calcitRIOL (ROCALTROL) 0.25 MCG capsule Take 7  capsules (1.75 mcg total) by mouth every Monday, Wednesday, and Friday with hemodialysis. 12/21/22   Lanae Boast, MD  cinacalcet (SENSIPAR) 30 MG tablet Take 1 tablet (30 mg total) by mouth every Monday, Wednesday, and Friday with hemodialysis. 12/19/22   Lanae Boast, MD  cyanocobalamin (VITAMIN B12) 1000 MCG/ML injection Inject 1,000 mcg into the muscle every 30 (thirty) days.    [provider]  cyclobenzaprine (FLEXERIL) 5 MG tablet Take 5 mg by mouth 3 (three) times daily as needed.    [provider]  folic acid (FOLVITE) 1 MG tablet Take 1 mg by mouth daily. 05/24/22   [provider]  gabapentin (NEURONTIN) 100 MG capsule Take 1 capsule (100 mg total) by mouth every evening. 12/13/22   Jerald Kief, MD  guaiFENesin-codeine 100-10 MG/5ML syrup Take 5 mLs by mouth every 4 (four) hours as needed for cough. 10/23/22   Jacalyn Lefevre, MD  hydrALAZINE (APRESOLINE) 25 MG tablet Take 1 tablet (25 mg total) by mouth 2 (two) times daily. Hold for sbp <110 12/27/22   Lanae Boast, MD  hydrOXYzine (ATARAX) 25 MG tablet Take 25 mg by mouth every 6 (six) hours as needed for itching. 06/06/22   [provider]  levETIRAcetam (KEPPRA) 500 MG tablet Take 1 tablet (500 mg total) by mouth daily. 11/15/22   Leroy Sea, MD  levothyroxine (SYNTHROID) 75 MCG tablet Take 1 tablet (75 mcg total) by mouth daily at 6 (six) AM. 02/02/23   Sherryll Burger, Pratik D, DO  metoprolol tartrate (LOPRESSOR) 25 MG tablet Take 0.5 tablets (12.5 mg total) by  mouth 2 (two) times daily. 02/01/23 03/08/23  Sherryll Burger, Pratik D, DO  multivitamin (RENA-VIT) TABS tablet Take 1 tablet by mouth daily.    [provider]  nitroGLYCERIN (NITROSTAT) 0.4 MG SL tablet Place 0.4 mg under the tongue every 5 (five) minutes as needed for chest pain.    [provider]  octreotide (SANDOSTATIN LAR) 20 MG injection Inject 20 mg into the muscle every 28 (twenty-eight) days. Patient not taking: Reported on 03/08/2023  11/15/22   Leroy Sea, MD  olmesartan (BENICAR) 20 MG tablet Take 20 mg by mouth daily. 01/14/23   [provider]  omeprazole (PRILOSEC) 20 MG capsule Take 1 capsule (20 mg total) by mouth 2 (two) times daily before a meal. Patient taking differently: Take 20 mg by mouth as needed (acid reflux). 02/01/23 03/08/23  Sherryll Burger, Pratik D, DO  ondansetron (ZOFRAN) 4 MG tablet Take 4 mg by mouth every 8 (eight) hours as needed. 02/27/23   [provider]  rosuvastatin (CRESTOR) 10 MG tablet Take 1 tablet by mouth once daily 06/06/22   [place] Ouch, [place] M, PA-C      Allergies    Amlodipine, Aspirin, Nitrofurantoin, Ranexa [ranolazine], Bactrim [sulfamethoxazole-trimethoprim], Iodinated contrast media, Iron, Tylenol [acetaminophen], Gabapentin, Sucroferric oxyhydroxide, Levaquin [levofloxacin in d5w], Levofloxacin, Plavix [clopidogrel bisulfate], Protonix [pantoprazole sodium], and Venofer [ferric oxide]    Review of Systems   Review of Systems  Cardiovascular:  Positive for syncope.  All other systems reviewed and are negative.   Physical Exam Updated Vital Signs There were no vitals taken for this visit. Physical Exam Vitals and nursing note reviewed.  Constitutional:      General: She is not in acute distress.    Appearance: She is well-developed.  HENT:     Head: Normocephalic and atraumatic.  Eyes:     Conjunctiva/sclera: Conjunctivae normal.  Cardiovascular:     Rate and Rhythm: Normal rate and regular rhythm.  Pulmonary:     Effort: Pulmonary effort is normal. No respiratory distress.     Breath sounds: Normal breath sounds.  Abdominal:     Palpations: Abdomen is soft.     Tenderness: There is no abdominal tenderness. There is no guarding.  Musculoskeletal:        General: No swelling.     Cervical back: Neck supple.  Skin:    General: Skin is warm and dry.     Capillary Refill: Capillary refill takes less than 2 seconds.  Neurological:     Mental Status: She  is alert.     Comments: Alert and oriented to self, place, time and event.   Speech is fluent, clear without dysarthria or dysphasia.   Strength symmetric in upper/lower extremities   Sensation intact in upper/lower extremities   Normal gait.  CN I not tested  CN II not tested CN III, IV, VI PERRLA and EOMs intact bilaterally  CN V Intact sensation to sharp and light touch to the face  CN VII facial movements symmetric  CN VIII not tested  CN IX, X no uvula deviation, symmetric rise of soft palate  CN XI symmetric SCM and trapezius strength bilaterally  CN XII Midline tongue protrusion, symmetric L/R movements     Psychiatric:        Mood and Affect: Mood normal.     ED Results / Procedures / Treatments   Labs (all labs ordered are listed, but only abnormal results are displayed) Labs Reviewed - No data to display  EKG None  Radiology No results found.  Procedures Procedures  {Document cardiac monitor, telemetry assessment procedure when appropriate:1}  Medications Ordered in ED Medications - No data to display  ED Course/ Medical Decision Making/ A&P   {   Click here for ABCD2, HEART and other calculatorsREFRESH Note before signing :1}                              Medical Decision Making Amount and/or Complexity of Data Reviewed Labs: ordered. Radiology: ordered. ECG/medicine tests: ordered.   This patient presents to the ED for concern of syncope, this involves an extensive number of treatment options, and is a complaint that carries with it a high risk of complications and morbidity.  The differential diagnosis includes orthostatic hypotension, vasovagal, anemia, electrolyte derangement, CVA, seizure, other   Co morbidities that complicate the patient evaluation  See HPI   Additional history obtained:  Additional history obtained from EMR External records from outside source obtained and reviewed including hospital records   Lab Tests:  I  Ordered, and personally interpreted labs.  The pertinent results include: No leukocytosis.  Patient with evidence of anemia with a hemoglobin 9.2.  Placed within range.  Hypocalcemia of 8.5 otherwise, electrolytes within normal limits.  No transaminitis.  Patient with baseline renal dysfunction with creatinine of 5.29, BUN of 18 and GFR of 8.   Imaging Studies ordered:  I ordered imaging studies including CT head/C-spine I independently visualized and interpreted imaging which showed no acute intracranial abnormality.  No acute fracture or traumatic pieces of C-spine.  Parenchymal atrophy and small vessel ischemic disease chronic.  Mild paranasal sinus mucosal thickening.  Slight grade 1 anterolisthesis.  Mild dextrocurvature.  5 mm erosion of dorsal aspect of dens.  Facet ankylosis. I agree with the radiologist interpretation  Cardiac Monitoring: / EKG:  The patient was maintained on a cardiac monitor.  I personally viewed and interpreted the cardiac monitored which showed an underlying rhythm of: Sinus rhythm with left bundle branch block.  No acute ischemic change by EKG performed   Consultations Obtained:  I requested consultation with attending physician Dr. Deretha Emory is in agreement treatment plan going forward  Problem List / ED Course / Critical interventions / Medication management  Near syncope Reevaluation of the patient showed that the patient stayed the same I have reviewed the patients home medicines and have made adjustments as needed   Social Determinants of Health:  Denies tobacco, licit drug use   Test / Admission - Considered:  Near syncope Laboratory/imaging studies significant for: See above *** Worrisome signs and symptoms were discussed with the patient, and the patient acknowledged understanding to return to the ED if noticed. Patient was stable upon discharge.    {Document critical care time when appropriate:1} {Document review of labs and clinical  decision tools ie heart score, Chads2Vasc2 etc:1}  {Document your independent review of radiology images, and any outside records:1} {Document your discussion with family members, caretakers, and with consultants:1} {Document social determinants of health affecting pt's care:1} {Document your decision making why or why not admission, treatments were needed:1} Final Clinical Impression(s) / ED Diagnoses Final diagnoses:  None    Rx / DC Orders ED Discharge Orders     None

## 2023-04-10 ENCOUNTER — Emergency Department (HOSPITAL_COMMUNITY): Payer: Medicare Other

## 2023-04-10 ENCOUNTER — Emergency Department (HOSPITAL_COMMUNITY)
Admission: EM | Admit: 2023-04-10 | Discharge: 2023-04-10 | Disposition: A | Discharge status: Home / Self Care | Payer: Medicare Other | Attending: Emergency Medicine | Admitting: Emergency Medicine

## 2023-04-10 ENCOUNTER — Other Ambulatory Visit: Payer: Self-pay

## 2023-04-10 DIAGNOSIS — I2510 Atherosclerotic heart disease of native coronary artery without angina pectoris: Secondary | ICD-10-CM | POA: Insufficient documentation

## 2023-04-10 DIAGNOSIS — Z1152 Encounter for screening for COVID-19: Secondary | ICD-10-CM | POA: Diagnosis not present

## 2023-04-10 DIAGNOSIS — R531 Weakness: Secondary | ICD-10-CM | POA: Insufficient documentation

## 2023-04-10 DIAGNOSIS — N186 End stage renal disease: Secondary | ICD-10-CM | POA: Diagnosis not present

## 2023-04-10 LAB — HEPATIC FUNCTION PANEL
ALT: 12 U/L (ref 0–44)
AST: 12 U/L — ABNORMAL LOW (ref 15–41)
Albumin: 2.2 g/dL — ABNORMAL LOW (ref 3.5–5.0)
Alkaline Phosphatase: 86 U/L (ref 38–126)
Bilirubin, Direct: 0.4 mg/dL — ABNORMAL HIGH (ref 0.0–0.2)
Indirect Bilirubin: 0.4 mg/dL (ref 0.3–0.9)
Total Bilirubin: 0.8 mg/dL (ref 0.3–1.2)
Total Protein: 5.5 g/dL — ABNORMAL LOW (ref 6.5–8.1)

## 2023-04-10 LAB — BASIC METABOLIC PANEL
Anion gap: 12 (ref 5–15)
BUN: 17 mg/dL (ref 8–23)
CO2: 25 mmol/L (ref 22–32)
Calcium: 8.3 mg/dL — ABNORMAL LOW (ref 8.9–10.3)
Chloride: 97 mmol/L — ABNORMAL LOW (ref 98–111)
Creatinine, Ser: 5.4 mg/dL — ABNORMAL HIGH (ref 0.44–1.00)
GFR, Estimated: 7 mL/min — ABNORMAL LOW (ref >60)
Glucose, Bld: 85 mg/dL (ref 70–99)
Potassium: 3.3 mmol/L — ABNORMAL LOW (ref 3.5–5.1)
Sodium: 134 mmol/L — ABNORMAL LOW (ref 135–145)

## 2023-04-10 LAB — BLOOD GAS, VENOUS
Acid-Base Excess: 4.4 mmol/L — ABNORMAL HIGH (ref 0.0–2.0)
Bicarbonate: 30.5 mmol/L — ABNORMAL HIGH (ref 20.0–28.0)
Drawn by: 442
O2 Saturation: 46.6 %
Patient temperature: 37.1
pCO2, Ven: 54 mm[Hg] (ref 44–60)
pH, Ven: 7.36 (ref 7.25–7.43)
pO2, Ven: 31 mm[Hg] — CL (ref 32–45)

## 2023-04-10 LAB — CBC
HCT: 27.4 % — ABNORMAL LOW (ref 36.0–46.0)
Hemoglobin: 9 g/dL — ABNORMAL LOW (ref 12.0–15.0)
MCH: 34.6 pg — ABNORMAL HIGH (ref 26.0–34.0)
MCHC: 32.8 g/dL (ref 30.0–36.0)
MCV: 105.4 fL — ABNORMAL HIGH (ref 80.0–100.0)
Platelets: 244 10*3/uL (ref 150–400)
RBC: 2.6 MIL/uL — ABNORMAL LOW (ref 3.87–5.11)
RDW: 26.9 % — ABNORMAL HIGH (ref 11.5–15.5)
WBC: 6.9 10*3/uL (ref 4.0–10.5)
nRBC: 0 % (ref 0.0–0.2)

## 2023-04-10 LAB — CBG MONITORING, ED: Glucose-Capillary: 68 mg/dL — ABNORMAL LOW (ref 70–99)

## 2023-04-10 LAB — TROPONIN I (HIGH SENSITIVITY)
Troponin I (High Sensitivity): 37 ng/L — ABNORMAL HIGH (ref <18)
Troponin I (High Sensitivity): 36 ng/L — ABNORMAL HIGH (ref <18)

## 2023-04-10 LAB — SARS CORONAVIRUS 2 BY RT PCR: SARS Coronavirus 2 by RT PCR: NEGATIVE

## 2023-04-10 LAB — AMMONIA: Ammonia: 27 umol/L (ref 9–35)

## 2023-04-10 NOTE — Progress Notes (Addendum)
   Nephrology Nursing Note:  Pt well known to [place] from multiple ED visits and hospitalizations.  [facility] staff confirms pt had 3.5 hours of dialysis on Saturday, 04/08/23.  If patient is not admitted today, [facility] can schedule her for HD tomorrow Tuesday 10/1 at 1100.     Arman Filter, RN

## 2023-04-10 NOTE — ED Triage Notes (Signed)
Pt presents with weakness, swelling, body aches, missed dialysis session today, d/t not feeling well, seen here for fall on Fri 04/07/23.

## 2023-04-10 NOTE — ED Provider Notes (Signed)
[place] EMERGENCY DEPARTMENT AT [facility] Provider Note   CSN: [id number] Arrival date & time: 04/10/23  1204     History  Chief Complaint  Patient presents with   Weakness    Shawna [name] is a 82 y.o. female, history of ESRD, CAD, who presents to the ED secondary to generalized weakness, and fatigue it has been going on for the last couple days.  She states she fell about 3 days ago, and missed her dialysis session, went to dialysis on Saturday instead, and is requesting to be dialyzed now.  She denies any shortness of breath, chest pain, but does state that she has swelling of her bilateral lower extremities.  Husband also noted that she has some swelling of the left orbit, has been going on for an unknown amount of time.  He states it may have started last night, or this morning but is unclear.  Patient just feels weak and achy and just tired all over.  Denies any fevers, chills. Home Medications Prior to Admission medications   Medication Sig Start Date End Date Taking? Authorizing Provider  ALPRAZolam Prudy Feeler) 0.5 MG tablet Take 1 tablet (0.5 mg total) by mouth daily as needed for up to 4 doses for anxiety. 12/19/22   Lanae Boast, MD  amiodarone (PACERONE) 100 MG tablet Take 1 tablet (100 mg total) by mouth daily. 02/02/23 03/08/23  Sherryll Burger, Pratik D, DO  benzonatate (TESSALON) 100 MG capsule Take 1 capsule (100 mg total) by mouth 3 (three) times daily as needed for cough. 01/18/23   Tilden Fossa, MD  calcitRIOL (ROCALTROL) 0.25 MCG capsule Take 7 capsules (1.75 mcg total) by mouth every Monday, Wednesday, and Friday with hemodialysis. 12/21/22   Lanae Boast, MD  cinacalcet (SENSIPAR) 30 MG tablet Take 1 tablet (30 mg total) by mouth every Monday, Wednesday, and Friday with hemodialysis. 12/19/22   Lanae Boast, MD  cyanocobalamin (VITAMIN B12) 1000 MCG/ML injection Inject 1,000 mcg into the muscle every 30 (thirty) days.    [provider]  cyclobenzaprine (FLEXERIL) 5 MG  tablet Take 5 mg by mouth 3 (three) times daily as needed.    [provider]  folic acid (FOLVITE) 1 MG tablet Take 1 mg by mouth daily. 05/24/22   [provider]  gabapentin (NEURONTIN) 100 MG capsule Take 1 capsule (100 mg total) by mouth every evening. 12/13/22   Jerald Kief, MD  guaiFENesin-codeine 100-10 MG/5ML syrup Take 5 mLs by mouth every 4 (four) hours as needed for cough. 10/23/22   Jacalyn Lefevre, MD  hydrALAZINE (APRESOLINE) 25 MG tablet Take 1 tablet (25 mg total) by mouth 2 (two) times daily. Hold for sbp <110 12/27/22   Lanae Boast, MD  hydrOXYzine (ATARAX) 25 MG tablet Take 25 mg by mouth every 6 (six) hours as needed for itching. 06/06/22   [provider]  levETIRAcetam (KEPPRA) 500 MG tablet Take 1 tablet (500 mg total) by mouth daily. 11/15/22   Leroy Sea, MD  levothyroxine (SYNTHROID) 75 MCG tablet Take 1 tablet (75 mcg total) by mouth daily at 6 (six) AM. 02/02/23   Sherryll Burger, Pratik D, DO  metoprolol tartrate (LOPRESSOR) 25 MG tablet Take 0.5 tablets (12.5 mg total) by mouth 2 (two) times daily. 02/01/23 03/08/23  Sherryll Burger, Pratik D, DO  multivitamin (RENA-VIT) TABS tablet Take 1 tablet by mouth daily.    [provider]  nitroGLYCERIN (NITROSTAT) 0.4 MG SL tablet Place 0.4 mg under the tongue every 5 (five) minutes  as needed for chest pain.    [provider]  octreotide (SANDOSTATIN LAR) 20 MG injection Inject 20 mg into the muscle every 28 (twenty-eight) days. Patient not taking: Reported on 03/08/2023 11/15/22   Leroy Sea, MD  olmesartan (BENICAR) 20 MG tablet Take 20 mg by mouth daily. 01/14/23   [provider]  omeprazole (PRILOSEC) 20 MG capsule Take 1 capsule (20 mg total) by mouth 2 (two) times daily before a meal. Patient taking differently: Take 20 mg by mouth as needed (acid reflux). 02/01/23 03/08/23  Sherryll Burger, Pratik D, DO  ondansetron (ZOFRAN) 4 MG tablet Take 4 mg by mouth every 8 (eight) hours as needed. 02/27/23    [provider]  rosuvastatin (CRESTOR) 10 MG tablet Take 1 tablet by mouth once daily 06/06/22   Iran Ouch, [place] M, PA-C      Allergies    Amlodipine, Aspirin, Nitrofurantoin, Ranexa [ranolazine], Bactrim [sulfamethoxazole-trimethoprim], Iodinated contrast media, Iron, Tylenol [acetaminophen], Gabapentin, Sucroferric oxyhydroxide, Levaquin [levofloxacin in d5w], Levofloxacin, Plavix [clopidogrel bisulfate], Protonix [pantoprazole sodium], and Venofer [ferric oxide]    Review of Systems   Review of Systems  Cardiovascular:  Negative for chest pain.  Neurological:  Positive for weakness.    Physical Exam Updated Vital Signs BP (!) 165/80   Pulse (!) 50   Temp 98.7 F (37.1 C) (Oral)   Resp 17   Ht 5\' 2"  (1.575 m)   Wt 56.2 kg   SpO2 96%   BMI 22.66 kg/m  Physical Exam Vitals and nursing note reviewed.  Constitutional:      General: She is not in acute distress.    Appearance: She is well-developed.  HENT:     Head: Normocephalic and atraumatic.     Mouth/Throat:     Mouth: Mucous membranes are dry.  Eyes:     Conjunctiva/sclera: Conjunctivae normal.     Comments: Periorbital edema of L eye without erythema  Cardiovascular:     Rate and Rhythm: Normal rate and regular rhythm.     Heart sounds: No murmur heard. Pulmonary:     Effort: Pulmonary effort is normal. No respiratory distress.     Breath sounds: Normal breath sounds.  Abdominal:     Palpations: Abdomen is soft.     Tenderness: There is no abdominal tenderness.  Musculoskeletal:        General: No swelling.     Cervical back: Neck supple.     Right lower leg: 2+ Pitting Edema present.     Left lower leg: 2+ Pitting Edema present.  Skin:    General: Skin is warm and dry.     Capillary Refill: Capillary refill takes less than 2 seconds.  Neurological:     Mental Status: She is alert.  Psychiatric:        Mood and Affect: Mood normal.     ED Results / Procedures / Treatments   Labs (all  labs ordered are listed, but only abnormal results are displayed) Labs Reviewed  BASIC METABOLIC PANEL - Abnormal; Notable for the following components:      Result Value   Sodium 134 (*)    Potassium 3.3 (*)    Chloride 97 (*)    Creatinine, Ser 5.40 (*)    Calcium 8.3 (*)    GFR, Estimated 7 (*)    All other components within normal limits  CBC - Abnormal; Notable for the following components:   RBC 2.60 (*)    Hemoglobin 9.0 (*)  HCT 27.4 (*)    MCV 105.4 (*)    MCH 34.6 (*)    RDW 26.9 (*)    All other components within normal limits  HEPATIC FUNCTION PANEL - Abnormal; Notable for the following components:   Total Protein 5.5 (*)    Albumin 2.2 (*)    AST 12 (*)    Bilirubin, Direct 0.4 (*)    All other components within normal limits  BLOOD GAS, VENOUS - Abnormal; Notable for the following components:   pO2, Ven 31 (*)    Bicarbonate 30.5 (*)    Acid-Base Excess 4.4 (*)    All other components within normal limits  CBG MONITORING, ED - Abnormal; Notable for the following components:   Glucose-Capillary 68 (*)    All other components within normal limits  TROPONIN I (HIGH SENSITIVITY) - Abnormal; Notable for the following components:   Troponin I (High Sensitivity) 37 (*)    All other components within normal limits  TROPONIN I (HIGH SENSITIVITY) - Abnormal; Notable for the following components:   Troponin I (High Sensitivity) 36 (*)    All other components within normal limits  SARS CORONAVIRUS 2 BY RT PCR  AMMONIA  CBG MONITORING, ED    EKG EKG Interpretation Date/Time:  Monday April 10 2023 12:57:24 EDT Ventricular Rate:  50 PR Interval:  184 QRS Duration:  134 QT Interval:  536 QTC Calculation: 488 R Axis:   126  Text Interpretation: Sinus bradycardia Right axis deviation Left ventricular hypertrophy with QRS widening and repolarization abnormality ( Cornell product ) Abnormal ECG Confirmed by Gloris Manchester ([id number]) on 04/10/2023 4:13:38  PM  Radiology CT Head Wo Contrast  Result Date: 04/10/2023 CLINICAL DATA:  Facial trauma EXAM: CT HEAD WITHOUT CONTRAST CT MAXILLOFACIAL WITHOUT CONTRAST TECHNIQUE: Multidetector CT imaging of the head and maxillofacial structures were performed using the standard protocol without intravenous contrast. Multiplanar CT image reconstructions of the maxillofacial structures were also generated. RADIATION DOSE REDUCTION: This exam was performed according to the departmental dose-optimization program which includes automated exposure control, adjustment of the mA and/or kV according to patient size and/or use of iterative reconstruction technique. COMPARISON:  CT brain 01/31/2023 FINDINGS: CT HEAD FINDINGS Brain: No acute territorial infarction, hemorrhage, or intracranial mass. Atrophy and chronic Faithann Natal vessel ischemic changes of the white matter. Tanyia Grabbe chronic basal ganglial infarcts. Vascular: No hyperdense vessels. Vertebral and carotid vascular calcification Skull: Normal. Negative for fracture or focal lesion. Other: None CT MAXILLOFACIAL FINDINGS Osseous: Mastoid air cells are clear. Mandibular heads are normally position. No mandibular fracture. Pterygoid plates and zygomatic arches are intact. No acute nasal bone fracture. Prominent right maxillary central incisor root lucency. Additional lucency and dental caries right mandibular molar. Orbits: Negative. No traumatic or inflammatory finding. Sinuses: Clear. Soft tissues: Left periorbital and premalar soft tissue swelling. IMPRESSION: 1. No CT evidence for acute intracranial abnormality. Atrophy and chronic Pride Gonzales vessel ischemic changes of the white matter. 2. No acute facial bone fracture. Left periorbital and premalar soft tissue swelling. Electronically Signed   By: Jasmine Pang M.D.   On: 04/10/2023 18:54   CT Maxillofacial Wo Contrast  Result Date: 04/10/2023 CLINICAL DATA:  Facial trauma EXAM: CT HEAD WITHOUT CONTRAST CT MAXILLOFACIAL WITHOUT  CONTRAST TECHNIQUE: Multidetector CT imaging of the head and maxillofacial structures were performed using the standard protocol without intravenous contrast. Multiplanar CT image reconstructions of the maxillofacial structures were also generated. RADIATION DOSE REDUCTION: This exam was performed according to the departmental dose-optimization program which  includes automated exposure control, adjustment of the mA and/or kV according to patient size and/or use of iterative reconstruction technique. COMPARISON:  CT brain 01/31/2023 FINDINGS: CT HEAD FINDINGS Brain: No acute territorial infarction, hemorrhage, or intracranial mass. Atrophy and chronic Jamarco Zaldivar vessel ischemic changes of the white matter. Mysti Haley chronic basal ganglial infarcts. Vascular: No hyperdense vessels. Vertebral and carotid vascular calcification Skull: Normal. Negative for fracture or focal lesion. Other: None CT MAXILLOFACIAL FINDINGS Osseous: Mastoid air cells are clear. Mandibular heads are normally position. No mandibular fracture. Pterygoid plates and zygomatic arches are intact. No acute nasal bone fracture. Prominent right maxillary central incisor root lucency. Additional lucency and dental caries right mandibular molar. Orbits: Negative. No traumatic or inflammatory finding. Sinuses: Clear. Soft tissues: Left periorbital and premalar soft tissue swelling. IMPRESSION: 1. No CT evidence for acute intracranial abnormality. Atrophy and chronic Markell Sciascia vessel ischemic changes of the white matter. 2. No acute facial bone fracture. Left periorbital and premalar soft tissue swelling. Electronically Signed   By: Jasmine Pang M.D.   On: 04/10/2023 18:54   DG Chest 2 View  Result Date: 04/10/2023 CLINICAL DATA:  Weakness EXAM: CHEST - 2 VIEW COMPARISON:  None Available. FINDINGS: Prior CABG. Left dialysis catheter tip in the right atrium. Mild cardiomegaly. Layering bilateral effusions with bibasilar atelectasis. No overt edema. No acute bony  abnormality. IMPRESSION: Layering Kenzy Campoverde bilateral pleural effusions with bibasilar atelectasis. Electronically Signed   By: Charlett Nose M.D.   On: 04/10/2023 18:31    Procedures Procedures    Medications Ordered in ED Medications - No data to display  ED Course/ Medical Decision Making/ A&P                                 Medical Decision Making Patient is an 82 year old female, here for weakness, found to have incidental left eye swelling.  She did have a fall couple weeks ago we will obtain a CT max, to just evaluate and make sure no evidence of any kind of fracture.  She does not have any erythema, or vision changes.  Unknown when this occurred per husband.  Patient is drooped over in chair.  Weakness is generalized, not in 1 specific area.  Also requesting dialysis, last given dialysis on Saturday.  Does not appear to be fluid overloaded.  Amount and/or Complexity of Data Reviewed Labs: ordered.    Details: Baseline for creatinine Radiology: ordered.    Details: CT head, shows no acute findings CT max face, shows no fractures except for just some swelling of the periorbital area.  Additionally chest x-ray shows Mossie Gilder bibasilar effusions Discussion of management or test interpretation with external provider(s): Discussed with patient, the swelling is resolved after she was sitting in bed with head up.  I believe this is likely secondary to gravity, given that she was slumped over in the wheelchair.  She has equal strength, was able to ambulate with no assistance in the hallway per nursing.  Her labs are reassuring, her workup was reassuring. Spoke to nephrology on-call Dr. Thedore Mins, his nurse reached out to evening, and patient will be able to have dialysis there at 11 AM tomorrow.  I discussed this with husband, and patient at bedside, and they are in agreement with plan.  Overall reassuring, able to ambulate by self.    Final Clinical Impression(s) / ED Diagnoses Final diagnoses:   Weakness    Rx / DC Orders ED  Discharge Orders     None         Pete Pelt, [place] 04/10/23 2015    Vanetta Mulders, MD 04/11/23 0010

## 2023-04-10 NOTE — Discharge Instructions (Addendum)
Right you do not meet criteria for inpatient dialysis, it has been arranged for you to go to the [place], dialysis center, tomorrow at [facility], and have your dialysis at 11.  Return to the ER if you have any worsening shortness of breath, weakness on one side of the body, or chest pain.  I believe the swelling of your eye was likely related to slouching over, if you have any vision changes, weakness in that eye, please return to the ER immediately

## 2023-04-10 NOTE — ED Notes (Signed)
Unable to get IV access at this time, another nurse is attempting.

## 2023-04-10 NOTE — ED Notes (Signed)
Pt ambulated in hall with walker, no assistance needed, pt reports feels at baseline while ambulating

## 2023-04-10 NOTE — ED Provider Triage Note (Signed)
Emergency Medicine Provider Triage Evaluation Note  Shawna [name] , a 82 y.o. female  was evaluated in triage.  Pt complains of generalized weakness.  Husband states patient has just been more weak, has of leg, and needs dialysis.  Typically is dialyzed Monday, Wednesday, Friday, with last dialyzed on Saturday, she is fell on Friday.  Also states that she has had some left eye swelling, going on for an unknown known amount of time.  She feels weak and achy all over.  Review of Systems  Positive: weakness Negative: Chest pain  Physical Exam  BP (!) 150/55 (BP Location: Right Arm)   Pulse (!) 50   Temp 98.7 F (37.1 C) (Oral)   Resp 20   Ht 5\' 2"  (1.575 m)   Wt 56.2 kg   SpO2 95%   BMI 22.66 kg/m  Gen:   Awake, no distress   Resp:  Normal effort  MSK:   Moves extremities without difficulty  Other:  Periorbital swelling of L eye, no facial droop noted, generalized weakness throughout, no specific limb weaknesss, 2+ pitting edema of BLE  Medical Decision Making  Medically screening exam initiated at 1:36 PM.  Appropriate orders placed.  [name] was informed that the remainder of the evaluation will be completed by another provider, this initial triage assessment does not replace that evaluation, and the importance of remaining in the ED until their evaluation is complete.    Pete Pelt, [place] 04/10/23 1338

## 2023-04-16 ENCOUNTER — Encounter (HOSPITAL_COMMUNITY): Payer: Self-pay

## 2023-04-16 ENCOUNTER — Emergency Department (HOSPITAL_COMMUNITY)
Admission: EM | Admit: 2023-04-16 | Discharge: 2023-04-16 | Disposition: A | Discharge status: Home / Self Care | Payer: Medicare Other | Attending: Emergency Medicine | Admitting: Emergency Medicine

## 2023-04-16 ENCOUNTER — Other Ambulatory Visit: Payer: Self-pay

## 2023-04-16 DIAGNOSIS — N186 End stage renal disease: Secondary | ICD-10-CM | POA: Insufficient documentation

## 2023-04-16 DIAGNOSIS — H1032 Unspecified acute conjunctivitis, left eye: Secondary | ICD-10-CM | POA: Diagnosis not present

## 2023-04-16 DIAGNOSIS — Z992 Dependence on renal dialysis: Secondary | ICD-10-CM | POA: Diagnosis not present

## 2023-04-16 DIAGNOSIS — Z79899 Other long term (current) drug therapy: Secondary | ICD-10-CM | POA: Diagnosis not present

## 2023-04-16 DIAGNOSIS — H5789 Other specified disorders of eye and adnexa: Secondary | ICD-10-CM | POA: Diagnosis present

## 2023-04-16 DIAGNOSIS — I509 Heart failure, unspecified: Secondary | ICD-10-CM | POA: Diagnosis not present

## 2023-04-16 DIAGNOSIS — L299 Pruritus, unspecified: Secondary | ICD-10-CM | POA: Insufficient documentation

## 2023-04-16 DIAGNOSIS — I132 Hypertensive heart and chronic kidney disease with heart failure and with stage 5 chronic kidney disease, or end stage renal disease: Secondary | ICD-10-CM | POA: Diagnosis not present

## 2023-04-16 MED ORDER — TOBRAMYCIN 0.3 % OP SOLN
1.0000 [drp] | OPHTHALMIC | Status: DC
  Administered 2023-04-16: 1 [drp] via OPHTHALMIC
  Filled 2023-04-16: qty 5

## 2023-04-16 NOTE — ED Provider Notes (Signed)
[place] [facility] AT [facility] Provider Note   CSN: [id number] Arrival date & time: 04/16/23  1610     History  Chief Complaint  Patient presents with   Eye Problem    Shawna [name] is a 82 y.o. female.   Eye Problem Associated symptoms: discharge and itching   Associated symptoms: no headaches, no nausea, no photophobia, no redness and no vomiting        Shawna [name] is a 82 y.o. female past medical history of end-stage renal disease on hemodialysis Monday Wednesday Friday, paroxysmal atrial fibrillation, GERD, anxiety and hypertension, CHF who presents to the Emergency Department complaining of itching, discharge and intermittent swelling to bilateral upper and lower eyelids with left worse than right.  States she woke this morning and her left eye was "matted shut" states she had persistent left eye symptoms x 1 week ago when she was seen in the emergency department for weakness and missed dialysis treatment.  Now beginning to have similar symptoms in the right eye.  She denies any eye pain, headache, dizziness, visual changes, neck pain, or fever No complaints of chest pain, shortness of breath or weakness. Completed her dialysis treatment on Friday    Home Medications Prior to Admission medications   Medication Sig Start Date End Date Taking? Authorizing Provider  ALPRAZolam Prudy Feeler) 0.5 MG tablet Take 1 tablet (0.5 mg total) by mouth daily as needed for up to 4 doses for anxiety. 12/19/22   Lanae Boast, MD  amiodarone (PACERONE) 100 MG tablet Take 1 tablet (100 mg total) by mouth daily. 02/02/23 03/08/23  Sherryll Burger, Pratik D, DO  benzonatate (TESSALON) 100 MG capsule Take 1 capsule (100 mg total) by mouth 3 (three) times daily as needed for cough. 01/18/23   Tilden Fossa, MD  calcitRIOL (ROCALTROL) 0.25 MCG capsule Take 7 capsules (1.75 mcg total) by mouth every Monday, Wednesday, and Friday with hemodialysis. 12/21/22   Lanae Boast, MD  cinacalcet (SENSIPAR)  30 MG tablet Take 1 tablet (30 mg total) by mouth every Monday, Wednesday, and Friday with hemodialysis. 12/19/22   Lanae Boast, MD  cyanocobalamin (VITAMIN B12) 1000 MCG/ML injection Inject 1,000 mcg into the muscle every 30 (thirty) days.    [provider]  cyclobenzaprine (FLEXERIL) 5 MG tablet Take 5 mg by mouth 3 (three) times daily as needed.    [provider]  folic acid (FOLVITE) 1 MG tablet Take 1 mg by mouth daily. 05/24/22   [provider]  gabapentin (NEURONTIN) 100 MG capsule Take 1 capsule (100 mg total) by mouth every evening. 12/13/22   Jerald Kief, MD  guaiFENesin-codeine 100-10 MG/5ML syrup Take 5 mLs by mouth every 4 (four) hours as needed for cough. 10/23/22   Jacalyn Lefevre, MD  hydrALAZINE (APRESOLINE) 25 MG tablet Take 1 tablet (25 mg total) by mouth 2 (two) times daily. Hold for sbp <110 12/27/22   Lanae Boast, MD  hydrOXYzine (ATARAX) 25 MG tablet Take 25 mg by mouth every 6 (six) hours as needed for itching. 06/06/22   [provider]  levETIRAcetam (KEPPRA) 500 MG tablet Take 1 tablet (500 mg total) by mouth daily. 11/15/22   Leroy Sea, MD  levothyroxine (SYNTHROID) 75 MCG tablet Take 1 tablet (75 mcg total) by mouth daily at 6 (six) AM. 02/02/23   Sherryll Burger, Pratik D, DO  metoprolol tartrate (LOPRESSOR) 25 MG tablet Take 0.5 tablets (12.5 mg total) by mouth 2 (two) times daily. 02/01/23 03/08/23  Sherryll Burger, Pratik D, DO  multivitamin (RENA-VIT) TABS tablet Take 1 tablet by mouth daily.    [provider]  nitroGLYCERIN (NITROSTAT) 0.4 MG SL tablet Place 0.4 mg under the tongue every 5 (five) minutes as needed for chest pain.    [provider]  octreotide (SANDOSTATIN LAR) 20 MG injection Inject 20 mg into the muscle every 28 (twenty-eight) days. Patient not taking: Reported on 03/08/2023 11/15/22   Leroy Sea, MD  olmesartan (BENICAR) 20 MG tablet Take 20 mg by mouth daily. 01/14/23   [provider]  omeprazole  (PRILOSEC) 20 MG capsule Take 1 capsule (20 mg total) by mouth 2 (two) times daily before a meal. Patient taking differently: Take 20 mg by mouth as needed (acid reflux). 02/01/23 03/08/23  Sherryll Burger, Pratik D, DO  ondansetron (ZOFRAN) 4 MG tablet Take 4 mg by mouth every 8 (eight) hours as needed. 02/27/23   [provider]  rosuvastatin (CRESTOR) 10 MG tablet Take 1 tablet by mouth once daily 06/06/22   [place] Ouch, [place] M, PA-C      Allergies    Amlodipine, Aspirin, Nitrofurantoin, Ranexa [ranolazine], Bactrim [sulfamethoxazole-trimethoprim], Iodinated contrast media, Iron, Tylenol [acetaminophen], Gabapentin, Sucroferric oxyhydroxide, Levaquin [levofloxacin in d5w], Levofloxacin, Plavix [clopidogrel bisulfate], Protonix [pantoprazole sodium], and Venofer [ferric oxide]    Review of Systems   Review of Systems  Constitutional:  Negative for appetite change, chills and fever.  HENT:  Positive for rhinorrhea and sneezing. Negative for congestion, sore throat and trouble swallowing.   Eyes:  Positive for discharge and itching. Negative for photophobia, pain, redness and visual disturbance.  Respiratory:  Negative for cough, chest tightness and shortness of breath.   Cardiovascular:  Negative for chest pain.  Gastrointestinal:  Negative for abdominal pain, diarrhea, nausea and vomiting.  Musculoskeletal:  Negative for neck pain.  Neurological:  Negative for dizziness and headaches.    Physical Exam Updated Vital Signs BP (!) 150/47 (BP Location: Right Arm)   Pulse 61   Temp 97.6 F (36.4 C)   Resp 17   Ht 5\' 2"  (1.575 m)   Wt 55.8 kg   SpO2 96%   BMI 22.50 kg/m  Physical Exam Vitals and nursing note reviewed.  Constitutional:      General: She is not in acute distress.    Appearance: Normal appearance. She is not ill-appearing or toxic-appearing.  HENT:     Head: Atraumatic.     Right Ear: Tympanic membrane and ear canal normal.     Left Ear: Tympanic membrane and ear canal  normal.     Nose: No rhinorrhea.     Mouth/Throat:     Mouth: Mucous membranes are moist.  Eyes:     General: Lids are everted, no foreign bodies appreciated. Vision grossly intact. Gaze aligned appropriately.        Right eye: No foreign body or hordeolum.        Left eye: Discharge present.No foreign body or hordeolum.     Extraocular Movements: Extraocular movements intact.     Conjunctiva/sclera: Conjunctivae normal.     Right eye: Right conjunctiva is not injected. No chemosis or hemorrhage.    Left eye: Left conjunctiva is not injected. Exudate present. No chemosis or hemorrhage.    Pupils: Pupils are equal, round, and reactive to light.     Comments: Mild edema of the bilateral upper and lower lids.  No periorbital tenderness or erythema.  There is small amount of exudate to the medial and lateral  canthus of the left eye.  Sclera clear  Cardiovascular:     Rate and Rhythm: Normal rate and regular rhythm.  Neurological:     Mental Status: She is alert.     ED Results / Procedures / Treatments   Labs (all labs ordered are listed, but only abnormal results are displayed) Labs Reviewed - No data to display  EKG None  Radiology No results found.  Procedures Procedures     Visual Acuity  Bilateral Distance: No Glasses 20/40 / Glasses 20/25  R Distance: 20/40  L Distance: 20/50     Medications Ordered in ED Medications  tobramycin (TOBREX) 0.3 % ophthalmic solution 1 drop (has no administration in time range)    ED Course/ Medical Decision Making/ A&P                                 Medical Decision Making Pt here for continued left eye itching, swelling of the lids, also now having milder sx's of the right eye and eye lids.  No facial pain or erythema of the face or periorbital areas.  No visual changes headache or dizziness.    She also has some URI sx's  Likely conjunctivitis, no clinical findings of retinal detachment, glaucoma, or iritis  Amount and/or  Complexity of Data Reviewed Discussion of management or test interpretation with external provider(s): I suspect viral conjunctivitis but pt does have some exudate of the left eye and sx's have been going on for more than one week, so I will treat with abx.  I have recommended PCP or ophtho f/u .  Pt given strict return precautions as well.    Risk Prescription drug management.           Final Clinical Impression(s) / ED Diagnoses Final diagnoses:  Acute conjunctivitis of left eye, unspecified acute conjunctivitis type    Rx / DC Orders ED Discharge Orders     None         Pauline Aus, PA-C 04/17/23 1325    Pricilla Loveless, MD 04/24/23 1549

## 2023-04-16 NOTE — ED Notes (Signed)
To be assessed by EDP

## 2023-04-16 NOTE — ED Triage Notes (Signed)
C/o "I have infection in both of my eyes." Denies vision problems When seen on 9/30 patient reports have swelling to left orbit and now has it to right.  Last dialysis treatment yesterday.

## 2023-04-16 NOTE — Discharge Instructions (Signed)
Alternate warm and cool compresses on and off to your eyes.  Apply 1 drop of the antibiotic to each eye 4 times a day for 5 to 7 days.  Please follow-up with your primary care provider for recheck.  I have also provided information for a ophthalmologist for you.  Please return to the emergency department if you develop any worsening symptoms such as visual changes, eye pain redness or increased swelling.

## 2023-04-21 ENCOUNTER — Other Ambulatory Visit: Payer: Self-pay

## 2023-04-21 ENCOUNTER — Emergency Department (HOSPITAL_COMMUNITY)
Admission: EM | Admit: 2023-04-21 | Discharge: 2023-04-22 | Disposition: A | Discharge status: Home / Self Care | Payer: Medicare Other | Attending: Emergency Medicine | Admitting: Emergency Medicine

## 2023-04-21 ENCOUNTER — Encounter (HOSPITAL_COMMUNITY): Payer: Self-pay | Admitting: *Deleted

## 2023-04-21 DIAGNOSIS — R109 Unspecified abdominal pain: Secondary | ICD-10-CM | POA: Diagnosis present

## 2023-04-21 DIAGNOSIS — Z5321 Procedure and treatment not carried out due to patient leaving prior to being seen by health care provider: Secondary | ICD-10-CM | POA: Diagnosis not present

## 2023-04-21 LAB — CBC
HCT: 25.6 % — ABNORMAL LOW (ref 36.0–46.0)
Hemoglobin: 8.5 g/dL — ABNORMAL LOW (ref 12.0–15.0)
MCH: 35.9 pg — ABNORMAL HIGH (ref 26.0–34.0)
MCHC: 33.2 g/dL (ref 30.0–36.0)
MCV: 108 fL — ABNORMAL HIGH (ref 80.0–100.0)
Platelets: 351 10*3/uL (ref 150–400)
RBC: 2.37 MIL/uL — ABNORMAL LOW (ref 3.87–5.11)
WBC: 19.9 10*3/uL — ABNORMAL HIGH (ref 4.0–10.5)
nRBC: 0 % (ref 0.0–0.2)

## 2023-04-21 LAB — COMPREHENSIVE METABOLIC PANEL
ALT: 14 U/L (ref 0–44)
AST: 24 U/L (ref 15–41)
Albumin: 2 g/dL — ABNORMAL LOW (ref 3.5–5.0)
Alkaline Phosphatase: 89 U/L (ref 38–126)
Anion gap: 10 (ref 5–15)
BUN: 10 mg/dL (ref 8–23)
CO2: 22 mmol/L (ref 22–32)
Calcium: 8.4 mg/dL — ABNORMAL LOW (ref 8.9–10.3)
Chloride: 100 mmol/L (ref 98–111)
Creatinine, Ser: 4.58 mg/dL — ABNORMAL HIGH (ref 0.44–1.00)
GFR, Estimated: 9 mL/min — ABNORMAL LOW (ref >60)
Glucose, Bld: 115 mg/dL — ABNORMAL HIGH (ref 70–99)
Potassium: 3.3 mmol/L — ABNORMAL LOW (ref 3.5–5.1)
Sodium: 132 mmol/L — ABNORMAL LOW (ref 135–145)
Total Bilirubin: 0.6 mg/dL (ref 0.3–1.2)
Total Protein: 5.7 g/dL — ABNORMAL LOW (ref 6.5–8.1)

## 2023-04-21 LAB — LIPASE, BLOOD: Lipase: 19 U/L (ref 11–51)

## 2023-04-21 NOTE — ED Triage Notes (Signed)
The pt is c/o abd pain and feeling bad for one week  she missed her dialysis today because she feel bad  dialysis cath in her chest

## 2023-04-21 NOTE — ED Notes (Signed)
Pt left. No longer wanted to keep waiting.

## 2023-04-22 ENCOUNTER — Encounter (HOSPITAL_COMMUNITY): Payer: Self-pay | Admitting: Nephrology

## 2023-04-23 ENCOUNTER — Emergency Department (HOSPITAL_COMMUNITY)
Admission: EM | Admit: 2023-04-23 | Discharge: 2023-04-24 | Disposition: A | Discharge status: Home / Self Care | Payer: Medicare Other | Attending: Emergency Medicine | Admitting: Emergency Medicine

## 2023-04-23 ENCOUNTER — Other Ambulatory Visit: Payer: Self-pay

## 2023-04-23 ENCOUNTER — Encounter (HOSPITAL_COMMUNITY): Payer: Self-pay

## 2023-04-23 ENCOUNTER — Emergency Department (HOSPITAL_COMMUNITY): Payer: Medicare Other

## 2023-04-23 DIAGNOSIS — R1084 Generalized abdominal pain: Secondary | ICD-10-CM

## 2023-04-23 DIAGNOSIS — Z992 Dependence on renal dialysis: Secondary | ICD-10-CM | POA: Insufficient documentation

## 2023-04-23 DIAGNOSIS — K529 Noninfective gastroenteritis and colitis, unspecified: Secondary | ICD-10-CM

## 2023-04-23 DIAGNOSIS — D649 Anemia, unspecified: Secondary | ICD-10-CM | POA: Insufficient documentation

## 2023-04-23 DIAGNOSIS — Z1152 Encounter for screening for COVID-19: Secondary | ICD-10-CM | POA: Insufficient documentation

## 2023-04-23 DIAGNOSIS — N186 End stage renal disease: Secondary | ICD-10-CM | POA: Insufficient documentation

## 2023-04-23 LAB — COMPREHENSIVE METABOLIC PANEL
ALT: 15 U/L (ref 0–44)
AST: 25 U/L (ref 15–41)
Albumin: 2 g/dL — ABNORMAL LOW (ref 3.5–5.0)
Alkaline Phosphatase: 86 U/L (ref 38–126)
Anion gap: 9 (ref 5–15)
BUN: 12 mg/dL (ref 8–23)
CO2: 25 mmol/L (ref 22–32)
Calcium: 8.2 mg/dL — ABNORMAL LOW (ref 8.9–10.3)
Chloride: 101 mmol/L (ref 98–111)
Creatinine, Ser: 4.61 mg/dL — ABNORMAL HIGH (ref 0.44–1.00)
GFR, Estimated: 9 mL/min — ABNORMAL LOW (ref >60)
Glucose, Bld: 123 mg/dL — ABNORMAL HIGH (ref 70–99)
Potassium: 2.9 mmol/L — ABNORMAL LOW (ref 3.5–5.1)
Sodium: 135 mmol/L (ref 135–145)
Total Bilirubin: 1 mg/dL (ref 0.3–1.2)
Total Protein: 5.4 g/dL — ABNORMAL LOW (ref 6.5–8.1)

## 2023-04-23 LAB — CBC WITH DIFFERENTIAL/PLATELET
Abs Immature Granulocytes: 0.03 10*3/uL (ref 0.00–0.07)
Basophils Absolute: 0.1 10*3/uL (ref 0.0–0.1)
Basophils Relative: 1 %
Eosinophils Absolute: 0.1 10*3/uL (ref 0.0–0.5)
Eosinophils Relative: 1 %
HCT: 21.5 % — ABNORMAL LOW (ref 36.0–46.0)
Hemoglobin: 7.4 g/dL — ABNORMAL LOW (ref 12.0–15.0)
Immature Granulocytes: 0 %
Lymphocytes Relative: 7 %
Lymphs Abs: 0.7 10*3/uL (ref 0.7–4.0)
MCH: 36.6 pg — ABNORMAL HIGH (ref 26.0–34.0)
MCHC: 34.4 g/dL (ref 30.0–36.0)
MCV: 106.4 fL — ABNORMAL HIGH (ref 80.0–100.0)
Monocytes Absolute: 0.5 10*3/uL (ref 0.1–1.0)
Monocytes Relative: 6 %
Neutro Abs: 7.5 10*3/uL (ref 1.7–7.7)
Neutrophils Relative %: 85 %
Platelets: 359 10*3/uL (ref 150–400)
RBC: 2.02 MIL/uL — ABNORMAL LOW (ref 3.87–5.11)
WBC: 8.9 10*3/uL (ref 4.0–10.5)
nRBC: 0 % (ref 0.0–0.2)

## 2023-04-23 LAB — SARS CORONAVIRUS 2 BY RT PCR: SARS Coronavirus 2 by RT PCR: NEGATIVE

## 2023-04-23 LAB — LIPASE, BLOOD: Lipase: 18 U/L (ref 11–51)

## 2023-04-23 NOTE — ED Triage Notes (Signed)
MWF dialysis  Pt skipped Friday  Increased swelling to legs and arms Difficulty walking due to swelling

## 2023-04-23 NOTE — ED Provider Notes (Signed)
[facility] Provider Note   CSN: [id number] Arrival date & time: 04/23/23  1842     History  Chief Complaint  Patient presents with   Fluid retention     Shawna [name] is a 82 y.o. female.  Pt with hx esrd/hd mwf, states missed dialysis Friday, and has bilateral ankle/lower leg swelling. Indicates poor appetite today. No emesis. +nausea. + mid abd pain, mild, non radiating. Having normal bms. At baseline, does not make urine, has been on dialysis for 10+ yrs. Denies change in meds/new meds. No fever or chills. Occasional non prod cough. No sore throat. Denies trauma/fall. No syncope. No headache. No chest pain or sob. No abd pain. No extremity pain. No skin lesions/rash.   The history is provided by the patient, the spouse and medical records. The history is limited by the condition of the patient.       Home Medications Prior to Admission medications   Medication Sig Start Date End Date Taking? Authorizing Provider  ALPRAZolam Prudy Feeler) 0.5 MG tablet Take 1 tablet (0.5 mg total) by mouth daily as needed for up to 4 doses for anxiety. 12/19/22   Lanae Boast, MD  amiodarone (PACERONE) 100 MG tablet Take 1 tablet (100 mg total) by mouth daily. 02/02/23 03/08/23  Sherryll Burger, Pratik D, DO  benzonatate (TESSALON) 100 MG capsule Take 1 capsule (100 mg total) by mouth 3 (three) times daily as needed for cough. 01/18/23   Tilden Fossa, MD  calcitRIOL (ROCALTROL) 0.25 MCG capsule Take 7 capsules (1.75 mcg total) by mouth every Monday, Wednesday, and Friday with hemodialysis. 12/21/22   Lanae Boast, MD  cinacalcet (SENSIPAR) 30 MG tablet Take 1 tablet (30 mg total) by mouth every Monday, Wednesday, and Friday with hemodialysis. 12/19/22   Lanae Boast, MD  cyanocobalamin (VITAMIN B12) 1000 MCG/ML injection Inject 1,000 mcg into the muscle every 30 (thirty) days.    [provider]  cyclobenzaprine (FLEXERIL) 5 MG tablet Take 5 mg by mouth 3 (three) times  daily as needed.    [provider]  folic acid (FOLVITE) 1 MG tablet Take 1 mg by mouth daily. 05/24/22   [provider]  gabapentin (NEURONTIN) 100 MG capsule Take 1 capsule (100 mg total) by mouth every evening. 12/13/22   Jerald Kief, MD  guaiFENesin-codeine 100-10 MG/5ML syrup Take 5 mLs by mouth every 4 (four) hours as needed for cough. 10/23/22   Jacalyn Lefevre, MD  hydrALAZINE (APRESOLINE) 25 MG tablet Take 1 tablet (25 mg total) by mouth 2 (two) times daily. Hold for sbp <110 12/27/22   Lanae Boast, MD  hydrOXYzine (ATARAX) 25 MG tablet Take 25 mg by mouth every 6 (six) hours as needed for itching. 06/06/22   [provider]  levETIRAcetam (KEPPRA) 500 MG tablet Take 1 tablet (500 mg total) by mouth daily. 11/15/22   Leroy Sea, MD  levothyroxine (SYNTHROID) 75 MCG tablet Take 1 tablet (75 mcg total) by mouth daily at 6 (six) AM. 02/02/23   Sherryll Burger, Pratik D, DO  metoprolol tartrate (LOPRESSOR) 25 MG tablet Take 0.5 tablets (12.5 mg total) by mouth 2 (two) times daily. 02/01/23 03/08/23  Sherryll Burger, Pratik D, DO  multivitamin (RENA-VIT) TABS tablet Take 1 tablet by mouth daily.    [provider]  nitroGLYCERIN (NITROSTAT) 0.4 MG SL tablet Place 0.4 mg under the tongue every 5 (five) minutes as needed for chest pain.    [provider]  octreotide (SANDOSTATIN  LAR) 20 MG injection Inject 20 mg into the muscle every 28 (twenty-eight) days. Patient not taking: Reported on 03/08/2023 11/15/22   Leroy Sea, MD  olmesartan (BENICAR) 20 MG tablet Take 20 mg by mouth daily. 01/14/23   [provider]  omeprazole (PRILOSEC) 20 MG capsule Take 1 capsule (20 mg total) by mouth 2 (two) times daily before a meal. Patient taking differently: Take 20 mg by mouth as needed (acid reflux). 02/01/23 03/08/23  Sherryll Burger, Pratik D, DO  ondansetron (ZOFRAN) 4 MG tablet Take 4 mg by mouth every 8 (eight) hours as needed. 02/27/23   [provider]  rosuvastatin  (CRESTOR) 10 MG tablet Take 1 tablet by mouth once daily 06/06/22   Iran Ouch, [place] M, PA-C      Allergies    Amlodipine, Aspirin, Nitrofurantoin, Ranexa [ranolazine], Bactrim [sulfamethoxazole-trimethoprim], Iodinated contrast media, Iron, Tylenol [acetaminophen], Gabapentin, Sucroferric oxyhydroxide, Levaquin [levofloxacin in d5w], Levofloxacin, Plavix [clopidogrel bisulfate], Protonix [pantoprazole sodium], and Venofer [ferric oxide]    Review of Systems   Review of Systems  Constitutional:  Negative for chills and fever.  HENT:  Negative for sore throat.   Eyes:  Negative for redness and visual disturbance.  Respiratory:  Positive for cough. Negative for shortness of breath.   Cardiovascular:  Positive for leg swelling. Negative for chest pain.  Gastrointestinal:  Positive for abdominal pain and nausea. Negative for constipation, diarrhea and vomiting.  Genitourinary:  Negative for dysuria and flank pain.  Musculoskeletal:  Negative for back pain and neck pain.  Skin:  Negative for rash.  Neurological:  Negative for speech difficulty, numbness and headaches.  Hematological:  Does not bruise/bleed easily.  Psychiatric/Behavioral:  Negative for confusion.     Physical Exam Updated Vital Signs BP (!) 170/64   Pulse 76   Temp 98.5 F (36.9 C)   Resp 16   Ht 1.575 m (5\' 2" )   Wt 54.4 kg   SpO2 94%   BMI 21.95 kg/m  Physical Exam Vitals and nursing note reviewed.  Constitutional:      Appearance: Normal appearance. She is well-developed.  HENT:     Head: Atraumatic.     Comments: No sinus or temporal tenderness.     Nose: Nose normal.     Mouth/Throat:     Mouth: Mucous membranes are moist.     Pharynx: Oropharynx is clear. No oropharyngeal exudate or posterior oropharyngeal erythema.  Eyes:     General: No scleral icterus.    Conjunctiva/sclera: Conjunctivae normal.     Pupils: Pupils are equal, round, and reactive to light.  Neck:     Vascular: No carotid bruit.      Trachea: No tracheal deviation.     Comments: No stiffness or rigidity.  Cardiovascular:     Rate and Rhythm: Normal rate and regular rhythm.     Pulses: Normal pulses.     Heart sounds: Normal heart sounds. No murmur heard.    No friction rub. No gallop.  Pulmonary:     Effort: Pulmonary effort is normal. No respiratory distress.     Breath sounds: Normal breath sounds.     Comments: HD cath left chest without obvious sign of infection.  Abdominal:     General: Bowel sounds are normal. There is no distension.     Palpations: Abdomen is soft. There is no mass.     Tenderness: There is abdominal tenderness. There is no guarding.     Comments: Mid abd tenderness.   Genitourinary:  Comments: No cva tenderness. Stool light yellow brown. Sent for hemoccult.  Musculoskeletal:        General: No tenderness.     Cervical back: Normal range of motion and neck supple. No rigidity. No muscular tenderness.     Comments: Mild symmetric bil ankle and lower leg edema.   Skin:    General: Skin is warm and dry.     Findings: No rash.  Neurological:     Mental Status: She is alert.     Comments: Alert, speech normal. Motor/sens grossly intact bil.   Psychiatric:        Mood and Affect: Mood normal.     ED Results / Procedures / Treatments   Labs (all labs ordered are listed, but only abnormal results are displayed) Results for orders placed or performed during the hospital encounter of 04/23/23  SARS Coronavirus 2 by RT PCR (hospital order, performed in [facility] hospital lab) *cepheid single result test* Anterior Nasal Swab   Specimen: Anterior Nasal Swab  Result Value Ref Range   SARS Coronavirus 2 by RT PCR NEGATIVE NEGATIVE  CBC with Differential  Result Value Ref Range   WBC 8.9 4.0 - 10.5 K/uL   RBC 2.02 (L) 3.87 - 5.11 MIL/uL   Hemoglobin 7.4 (L) 12.0 - 15.0 g/dL   HCT 16.1 (L) 09.6 - 04.5 %   MCV 106.4 (H) 80.0 - 100.0 fL   MCH 36.6 (H) 26.0 - 34.0 pg   MCHC 34.4 30.0 -  36.0 g/dL   RDW Not Measured 40.9 - 15.5 %   Platelets 359 150 - 400 K/uL   nRBC 0.0 0.0 - 0.2 %   Neutrophils Relative % 85 %   Neutro Abs 7.5 1.7 - 7.7 K/uL   Lymphocytes Relative 7 %   Lymphs Abs 0.7 0.7 - 4.0 K/uL   Monocytes Relative 6 %   Monocytes Absolute 0.5 0.1 - 1.0 K/uL   Eosinophils Relative 1 %   Eosinophils Absolute 0.1 0.0 - 0.5 K/uL   Basophils Relative 1 %   Basophils Absolute 0.1 0.0 - 0.1 K/uL   Immature Granulocytes 0 %   Abs Immature Granulocytes 0.03 0.00 - 0.07 K/uL  Comprehensive metabolic panel  Result Value Ref Range   Sodium 135 135 - 145 mmol/L   Potassium 2.9 (L) 3.5 - 5.1 mmol/L   Chloride 101 98 - 111 mmol/L   CO2 25 22 - 32 mmol/L   Glucose, Bld 123 (H) 70 - 99 mg/dL   BUN 12 8 - 23 mg/dL   Creatinine, Ser 8.11 (H) 0.44 - 1.00 mg/dL   Calcium 8.2 (L) 8.9 - 10.3 mg/dL   Total Protein 5.4 (L) 6.5 - 8.1 g/dL   Albumin 2.0 (L) 3.5 - 5.0 g/dL   AST 25 15 - 41 U/L   ALT 15 0 - 44 U/L   Alkaline Phosphatase 86 38 - 126 U/L   Total Bilirubin 1.0 0.3 - 1.2 mg/dL   GFR, Estimated 9 (L) >60 mL/min   Anion gap 9 5 - 15  Lipase, blood  Result Value Ref Range   Lipase 18 11 - 51 U/L   *Note: Due to a large number of results and/or encounters for the requested time period, some results have not been displayed. A complete set of results can be found in Results Review.   DG Chest Port 1 View  Result Date: 04/23/2023 CLINICAL DATA:  Skipped dialysis with increased swelling to the legs and arms.  EXAM: PORTABLE CHEST 1 VIEW COMPARISON:  April 10, 2023 FINDINGS: There is stable left-sided venous catheter positioning. Multiple sternal wires and vascular clips are noted. The cardiac silhouette is mildly enlarged and unchanged in size. A coronary artery stent is in place. A radiopaque vascular stent is also seen along the proximal left upper extremity. There is marked severity calcification of the thoracic aorta. Mild diffusely increased interstitial lung  markings are present with mild atelectatic changes seen within the bilateral lung bases. Small bilateral pleural effusions are noted. No pneumothorax is identified. Multilevel degenerative changes are seen throughout the thoracic spine. IMPRESSION: 1. Evidence of prior median sternotomy/CABG. 2. Mild interstitial edema with small bilateral pleural effusions and mild bibasilar atelectasis. Electronically Signed   By: Aram Candela M.D.   On: 04/23/2023 23:15   CT Head Wo Contrast  Result Date: 04/10/2023 CLINICAL DATA:  Facial trauma EXAM: CT HEAD WITHOUT CONTRAST CT MAXILLOFACIAL WITHOUT CONTRAST TECHNIQUE: Multidetector CT imaging of the head and maxillofacial structures were performed using the standard protocol without intravenous contrast. Multiplanar CT image reconstructions of the maxillofacial structures were also generated. RADIATION DOSE REDUCTION: This exam was performed according to the departmental dose-optimization program which includes automated exposure control, adjustment of the mA and/or kV according to patient size and/or use of iterative reconstruction technique. COMPARISON:  CT brain 01/31/2023 FINDINGS: CT HEAD FINDINGS Brain: No acute territorial infarction, hemorrhage, or intracranial mass. Atrophy and chronic small vessel ischemic changes of the white matter. Small chronic basal ganglial infarcts. Vascular: No hyperdense vessels. Vertebral and carotid vascular calcification Skull: Normal. Negative for fracture or focal lesion. Other: None CT MAXILLOFACIAL FINDINGS Osseous: Mastoid air cells are clear. Mandibular heads are normally position. No mandibular fracture. Pterygoid plates and zygomatic arches are intact. No acute nasal bone fracture. Prominent right maxillary central incisor root lucency. Additional lucency and dental caries right mandibular molar. Orbits: Negative. No traumatic or inflammatory finding. Sinuses: Clear. Soft tissues: Left periorbital and premalar soft tissue  swelling. IMPRESSION: 1. No CT evidence for acute intracranial abnormality. Atrophy and chronic small vessel ischemic changes of the white matter. 2. No acute facial bone fracture. Left periorbital and premalar soft tissue swelling. Electronically Signed   By: Jasmine Pang M.D.   On: 04/10/2023 18:54   CT Maxillofacial Wo Contrast  Result Date: 04/10/2023 CLINICAL DATA:  Facial trauma EXAM: CT HEAD WITHOUT CONTRAST CT MAXILLOFACIAL WITHOUT CONTRAST TECHNIQUE: Multidetector CT imaging of the head and maxillofacial structures were performed using the standard protocol without intravenous contrast. Multiplanar CT image reconstructions of the maxillofacial structures were also generated. RADIATION DOSE REDUCTION: This exam was performed according to the departmental dose-optimization program which includes automated exposure control, adjustment of the mA and/or kV according to patient size and/or use of iterative reconstruction technique. COMPARISON:  CT brain 01/31/2023 FINDINGS: CT HEAD FINDINGS Brain: No acute territorial infarction, hemorrhage, or intracranial mass. Atrophy and chronic small vessel ischemic changes of the white matter. Small chronic basal ganglial infarcts. Vascular: No hyperdense vessels. Vertebral and carotid vascular calcification Skull: Normal. Negative for fracture or focal lesion. Other: None CT MAXILLOFACIAL FINDINGS Osseous: Mastoid air cells are clear. Mandibular heads are normally position. No mandibular fracture. Pterygoid plates and zygomatic arches are intact. No acute nasal bone fracture. Prominent right maxillary central incisor root lucency. Additional lucency and dental caries right mandibular molar. Orbits: Negative. No traumatic or inflammatory finding. Sinuses: Clear. Soft tissues: Left periorbital and premalar soft tissue swelling. IMPRESSION: 1. No CT evidence for acute intracranial abnormality.  Atrophy and chronic small vessel ischemic changes of the white matter. 2. No  acute facial bone fracture. Left periorbital and premalar soft tissue swelling. Electronically Signed   By: Jasmine Pang M.D.   On: 04/10/2023 18:54   DG Chest 2 View  Result Date: 04/10/2023 CLINICAL DATA:  Weakness EXAM: CHEST - 2 VIEW COMPARISON:  None Available. FINDINGS: Prior CABG. Left dialysis catheter tip in the right atrium. Mild cardiomegaly. Layering bilateral effusions with bibasilar atelectasis. No overt edema. No acute bony abnormality. IMPRESSION: Layering small bilateral pleural effusions with bibasilar atelectasis. Electronically Signed   By: Charlett Nose M.D.   On: 04/10/2023 18:31   CT HEAD WO CONTRAST  Result Date: 04/07/2023 CLINICAL DATA:  Provided history: Head trauma, minor. Neck trauma. Additional history provided: Fall (hitting back of head). EXAM: CT HEAD WITHOUT CONTRAST CT CERVICAL SPINE WITHOUT CONTRAST TECHNIQUE: Multidetector CT imaging of the head and cervical spine was performed following the standard protocol without intravenous contrast. Multiplanar CT image reconstructions of the cervical spine were also generated. RADIATION DOSE REDUCTION: This exam was performed according to the departmental dose-optimization program which includes automated exposure control, adjustment of the mA and/or kV according to patient size and/or use of iterative reconstruction technique. COMPARISON:  Head CT 02/10/2023.  Cervical spine CT 07/29/2022. FINDINGS: CT HEAD FINDINGS Brain: Generalized cerebral atrophy. Chronic lacunar infarct within the left lentiform nucleus/retrolenticular white matter. Background patchy and ill-defined hypoattenuation within the cerebral white matter, nonspecific but compatible with moderate to advanced chronic small vessel ischemic disease. Partially empty sella turcica. There is no acute intracranial hemorrhage. No demarcated cortical infarct. No extra-axial fluid collection. No evidence of an intracranial mass. No midline shift. Vascular: No hyperdense  vessel.  Atherosclerotic calcifications. Skull: No calvarial fracture or aggressive osseous lesion. Sinuses/Orbits: No mass or acute finding within the imaged orbits. Minimal mucosal thickening within the left maxillary sinus at the imaged levels. Mild mucosal thickening within the left sphenoid sinus. CT CERVICAL SPINE FINDINGS Alignment: Dextrocurvature of the cervical spine. Slight grade 1 anterolisthesis at C6-C7, C7-T1 and T1-T2. Skull base and vertebrae: The basion-dental and atlanto-dental intervals are maintained.No evidence of acute fracture to the cervical spine. 5 mm erosion within the dorsal aspect of the dens to the right, new from the prior cervical spine CT of 07/29/2022. Redemonstrated small cyst within the right aspect of the C2 vertebral body (series 4, image 22). Facet ankylosis on the left at C6-C7. Soft tissues and spinal canal: No prevertebral fluid or swelling. No visible canal hematoma. Disc levels: Cervical spondylosis with multilevel disc space narrowing, disc bulges/central disc protrusions and facet arthrosis. No appreciable high-grade spinal canal stenosis. No significant bony neural foraminal narrowing. Upper chest: Partially imaged right pleural effusion. Partially imaged left-sided central venous catheter. IMPRESSION: CT head: 1.  No evidence of an acute intracranial abnormality. 2. Parenchymal atrophy and chronic small vessel ischemic disease, as described. This includes a chronic lacunar infarct within the left lentiform nucleus/retrolenticular white matter. 3. Mild paranasal sinus mucosal thickening at the imaged levels. CT cervical spine: 1. No evidence of an acute cervical spine fracture. 2. Slight grade 1 anterolisthesis at C6-C7, C7-T1 and T1-T2. 3. Mild dextrocurvature of the cervical spine. 4. 5 mm erosion within the dorsal aspect of the dens, new from the prior cervical spine CT of 07/29/2022. Although nonspecific, this could reflect sequela of an inflammatory arthropathy.  5. Facet ankylosis on the left at C6-C7. 6. Partially imaged right pleural effusion. Electronically Signed   By: Ronaldo Miyamoto  Renette Butters D.O.   On: 04/07/2023 16:48   CT CERVICAL SPINE WO CONTRAST  Result Date: 04/07/2023 CLINICAL DATA:  Provided history: Head trauma, minor. Neck trauma. Additional history provided: Fall (hitting back of head). EXAM: CT HEAD WITHOUT CONTRAST CT CERVICAL SPINE WITHOUT CONTRAST TECHNIQUE: Multidetector CT imaging of the head and cervical spine was performed following the standard protocol without intravenous contrast. Multiplanar CT image reconstructions of the cervical spine were also generated. RADIATION DOSE REDUCTION: This exam was performed according to the departmental dose-optimization program which includes automated exposure control, adjustment of the mA and/or kV according to patient size and/or use of iterative reconstruction technique. COMPARISON:  Head CT 02/10/2023.  Cervical spine CT 07/29/2022. FINDINGS: CT HEAD FINDINGS Brain: Generalized cerebral atrophy. Chronic lacunar infarct within the left lentiform nucleus/retrolenticular white matter. Background patchy and ill-defined hypoattenuation within the cerebral white matter, nonspecific but compatible with moderate to advanced chronic small vessel ischemic disease. Partially empty sella turcica. There is no acute intracranial hemorrhage. No demarcated cortical infarct. No extra-axial fluid collection. No evidence of an intracranial mass. No midline shift. Vascular: No hyperdense vessel.  Atherosclerotic calcifications. Skull: No calvarial fracture or aggressive osseous lesion. Sinuses/Orbits: No mass or acute finding within the imaged orbits. Minimal mucosal thickening within the left maxillary sinus at the imaged levels. Mild mucosal thickening within the left sphenoid sinus. CT CERVICAL SPINE FINDINGS Alignment: Dextrocurvature of the cervical spine. Slight grade 1 anterolisthesis at C6-C7, C7-T1 and T1-T2. Skull base and  vertebrae: The basion-dental and atlanto-dental intervals are maintained.No evidence of acute fracture to the cervical spine. 5 mm erosion within the dorsal aspect of the dens to the right, new from the prior cervical spine CT of 07/29/2022. Redemonstrated small cyst within the right aspect of the C2 vertebral body (series 4, image 22). Facet ankylosis on the left at C6-C7. Soft tissues and spinal canal: No prevertebral fluid or swelling. No visible canal hematoma. Disc levels: Cervical spondylosis with multilevel disc space narrowing, disc bulges/central disc protrusions and facet arthrosis. No appreciable high-grade spinal canal stenosis. No significant bony neural foraminal narrowing. Upper chest: Partially imaged right pleural effusion. Partially imaged left-sided central venous catheter. IMPRESSION: CT head: 1.  No evidence of an acute intracranial abnormality. 2. Parenchymal atrophy and chronic small vessel ischemic disease, as described. This includes a chronic lacunar infarct within the left lentiform nucleus/retrolenticular white matter. 3. Mild paranasal sinus mucosal thickening at the imaged levels. CT cervical spine: 1. No evidence of an acute cervical spine fracture. 2. Slight grade 1 anterolisthesis at C6-C7, C7-T1 and T1-T2. 3. Mild dextrocurvature of the cervical spine. 4. 5 mm erosion within the dorsal aspect of the dens, new from the prior cervical spine CT of 07/29/2022. Although nonspecific, this could reflect sequela of an inflammatory arthropathy. 5. Facet ankylosis on the left at C6-C7. 6. Partially imaged right pleural effusion. Electronically Signed   By: Jackey Loge D.O.   On: 04/07/2023 16:48    EKG EKG Interpretation Date/Time:  Sunday April 23 2023 23:17:43 EDT Ventricular Rate:  77 PR Interval:  175 QRS Duration:  156 QT Interval:  462 QTC Calculation: 523 R Axis:   4  Text Interpretation: Sinus rhythm Left bundle branch block Confirmed by Cathren Laine ([id number]) on  04/23/2023 11:20:57 PM  Radiology DG Chest Port 1 View  Result Date: 04/23/2023 CLINICAL DATA:  Skipped dialysis with increased swelling to the legs and arms. EXAM: PORTABLE CHEST 1 VIEW COMPARISON:  April 10, 2023 FINDINGS: There is stable left-sided  venous catheter positioning. Multiple sternal wires and vascular clips are noted. The cardiac silhouette is mildly enlarged and unchanged in size. A coronary artery stent is in place. A radiopaque vascular stent is also seen along the proximal left upper extremity. There is marked severity calcification of the thoracic aorta. Mild diffusely increased interstitial lung markings are present with mild atelectatic changes seen within the bilateral lung bases. Small bilateral pleural effusions are noted. No pneumothorax is identified. Multilevel degenerative changes are seen throughout the thoracic spine. IMPRESSION: 1. Evidence of prior median sternotomy/CABG. 2. Mild interstitial edema with small bilateral pleural effusions and mild bibasilar atelectasis. Electronically Signed   By: Aram Candela M.D.   On: 04/23/2023 23:15    Procedures Procedures    Medications Ordered in ED Medications - No data to display  ED Course/ Medical Decision Making/ A&P                                 Medical Decision Making Problems Addressed: ESRD on dialysis [facility]): chronic illness or injury with exacerbation, progression, or side effects of treatment that poses a threat to life or bodily functions    Details: Recent missed dialysis Generalized abdominal pain: acute illness or injury with systemic symptoms that poses a threat to life or bodily functions Symptomatic anemia: chronic illness or injury with exacerbation, progression, or side effects of treatment that poses a threat to life or bodily functions  Amount and/or Complexity of Data Reviewed Independent Historian: spouse    Details: hx External Data Reviewed: labs and notes. Labs: ordered.  Decision-making details documented in ED Course. Radiology: ordered and independent interpretation performed. Decision-making details documented in ED Course. ECG/medicine tests: ordered.  Risk Decision regarding hospitalization.   Iv ns. Continuous pulse ox and cardiac monitoring. Labs ordered/sent. Imaging ordered.   Differential diagnosis includes dehydration, CKD/uremia, infection, etc. Dispo decision including potential need for admission considered - will get labs and imaging and reassess.   Reviewed nursing notes and prior charts for additional history. External reports reviewed. Additional history from: family.   Cardiac monitor: sinus rhythm, rate 76.  Labs reviewed/interpreted by me - wbc normal hct 21. K not elevated.  Pt indicates makes no urine at baseline. Hct is lower than prior. Pt denies blood loss, rectal bleeding or melena. Pt/fam note hx anemia, and prior episodic transfusion. Stool is yellow/light brown.   Ct remains pending.   Xrays reviewed/interpreted by me - mild vascular congestion.  2350, ct pending, signed out to Dr Durwin Nora to check pending studies, and dispo appropriately.           Final Clinical Impression(s) / ED Diagnoses Final diagnoses:  None    Rx / DC Orders ED Discharge Orders     None         Cathren Laine, MD 04/23/23 2353

## 2023-04-24 LAB — POC OCCULT BLOOD, ED: Fecal Occult Blood: NEGATIVE

## 2023-04-24 MED ORDER — OXYCODONE-ACETAMINOPHEN 5-325 MG PO TABS
1.0000 | ORAL_TABLET | Freq: Once | ORAL | Status: DC
  Filled 2023-04-24: qty 1

## 2023-04-24 MED ORDER — CIPROFLOXACIN HCL 500 MG PO TABS: 500.0000 mg | ORAL_TABLET | Freq: Every day | ORAL | 0 refills | Status: AC

## 2023-04-24 NOTE — ED Provider Notes (Signed)
Care of patient assumed from Dr. Denton Lank.  Patient with recent missed dialysis on Friday presenting for nausea and abdominal pain.  Hemoglobin mildly decreased from baseline.  No current respiratory or electrolyte concerns.  Currently awaiting CT of abdomen and pelvis.  If negative, can be discharged with plan for her outpatient dialysis in the morning. Physical Exam  BP (!) 159/59   Pulse 75   Temp 98.5 F (36.9 C)   Resp 14   Ht 5\' 2"  (1.575 m)   Wt 54.4 kg   SpO2 98%   BMI 21.95 kg/m   Physical Exam Vitals and nursing note reviewed.  Constitutional:      General: She is not in acute distress.    Appearance: Normal appearance. She is well-developed. She is not ill-appearing, toxic-appearing or diaphoretic.  HENT:     Head: Normocephalic and atraumatic.     Right Ear: External ear normal.     Left Ear: External ear normal.     Nose: Nose normal.     Mouth/Throat:     Mouth: Mucous membranes are moist.  Eyes:     Conjunctiva/sclera: Conjunctivae normal.  Cardiovascular:     Rate and Rhythm: Normal rate and regular rhythm.  Pulmonary:     Effort: Pulmonary effort is normal. No respiratory distress.  Abdominal:     Palpations: Abdomen is soft.     Tenderness: There is no abdominal tenderness.  Musculoskeletal:        General: No deformity.     Cervical back: Neck supple.  Skin:    General: Skin is warm and dry.     Coloration: Skin is not jaundiced or pale.  Neurological:     General: No focal deficit present.     Mental Status: She is alert and oriented to person, place, and time.  Psychiatric:        Mood and Affect: Mood normal.        Behavior: Behavior normal.     Procedures  Procedures  ED Course / MDM    Medical Decision Making Amount and/or Complexity of Data Reviewed Labs: ordered. Radiology: ordered. ECG/medicine tests: ordered.  Risk Prescription drug management.   On assessment, patient resting comfortably.  She states that her abdominal pain  and nausea have resolved.  She does endorse ongoing diffuse pain in her legs that has been present for the past several weeks.  Her CT imaging did show some colonic wall thickening concerning for possible colitis.  Prescription for ciprofloxacin was provided.  Patient confirms that she will be able to go undergo dialysis this morning.  She is in favor of discharge home.  She was given dose of Percocet for her leg pain.  She was advised to follow-up with her primary care doctor for ongoing management of chronic issues.  She was informed of her hemoglobin level of 7.4 g/dL today.  She was advised to inform her dialysis center for possible transfusion during dialysis.  She was discharged in stable condition.       Gloris Manchester, MD 04/24/23 [phone]

## 2023-04-24 NOTE — Discharge Instructions (Addendum)
Your CT scan showed a possible colitis.  A prescription for an antibiotic was sent to your pharmacy.  Take for the next 3 days.  Ensure that you get your dialysis later this morning.  Inform your dialysis center that your hemoglobin was 7.4.  They may want to give you a blood transfusion during dialysis.  Follow-up with your primary care doctor for management of your arthritis and ongoing chronic conditions.  Return to the emergency department for any new or worsening symptoms of concern.

## 2023-04-24 NOTE — ED Notes (Signed)
ED Provider at bedside. 

## 2023-05-01 ENCOUNTER — Observation Stay (HOSPITAL_COMMUNITY)
Admission: EM | Admit: 2023-05-01 | Discharge: 2023-05-02 | Disposition: A | Discharge status: Home / Self Care | Payer: Medicare Other | Attending: Emergency Medicine | Admitting: Emergency Medicine

## 2023-05-01 ENCOUNTER — Encounter (HOSPITAL_COMMUNITY): Payer: Self-pay

## 2023-05-01 ENCOUNTER — Emergency Department (HOSPITAL_COMMUNITY): Payer: Medicare Other

## 2023-05-01 ENCOUNTER — Other Ambulatory Visit: Payer: Self-pay

## 2023-05-01 DIAGNOSIS — K5520 Angiodysplasia of colon without hemorrhage: Secondary | ICD-10-CM | POA: Diagnosis present

## 2023-05-01 DIAGNOSIS — Z951 Presence of aortocoronary bypass graft: Secondary | ICD-10-CM | POA: Insufficient documentation

## 2023-05-01 DIAGNOSIS — J9601 Acute respiratory failure with hypoxia: Secondary | ICD-10-CM | POA: Insufficient documentation

## 2023-05-01 DIAGNOSIS — Z8673 Personal history of transient ischemic attack (TIA), and cerebral infarction without residual deficits: Secondary | ICD-10-CM | POA: Insufficient documentation

## 2023-05-01 DIAGNOSIS — I5022 Chronic systolic (congestive) heart failure: Secondary | ICD-10-CM | POA: Diagnosis not present

## 2023-05-01 DIAGNOSIS — N186 End stage renal disease: Secondary | ICD-10-CM | POA: Diagnosis not present

## 2023-05-01 DIAGNOSIS — Z7982 Long term (current) use of aspirin: Secondary | ICD-10-CM | POA: Insufficient documentation

## 2023-05-01 DIAGNOSIS — I482 Chronic atrial fibrillation, unspecified: Secondary | ICD-10-CM | POA: Diagnosis present

## 2023-05-01 DIAGNOSIS — Q2733 Arteriovenous malformation of digestive system vessel: Secondary | ICD-10-CM | POA: Diagnosis not present

## 2023-05-01 DIAGNOSIS — Z85030 Personal history of malignant carcinoid tumor of large intestine: Secondary | ICD-10-CM | POA: Diagnosis not present

## 2023-05-01 DIAGNOSIS — D62 Acute posthemorrhagic anemia: Secondary | ICD-10-CM | POA: Diagnosis present

## 2023-05-01 DIAGNOSIS — I2510 Atherosclerotic heart disease of native coronary artery without angina pectoris: Secondary | ICD-10-CM | POA: Diagnosis not present

## 2023-05-01 DIAGNOSIS — R531 Weakness: Secondary | ICD-10-CM | POA: Diagnosis present

## 2023-05-01 DIAGNOSIS — D631 Anemia in chronic kidney disease: Secondary | ICD-10-CM | POA: Insufficient documentation

## 2023-05-01 DIAGNOSIS — Z992 Dependence on renal dialysis: Secondary | ICD-10-CM | POA: Diagnosis not present

## 2023-05-01 DIAGNOSIS — Z79899 Other long term (current) drug therapy: Secondary | ICD-10-CM | POA: Insufficient documentation

## 2023-05-01 DIAGNOSIS — I132 Hypertensive heart and chronic kidney disease with heart failure and with stage 5 chronic kidney disease, or end stage renal disease: Secondary | ICD-10-CM | POA: Diagnosis not present

## 2023-05-01 DIAGNOSIS — D649 Anemia, unspecified: Principal | ICD-10-CM

## 2023-05-01 LAB — COMPREHENSIVE METABOLIC PANEL
ALT: 14 U/L (ref 0–44)
AST: 25 U/L (ref 15–41)
Albumin: 1.9 g/dL — ABNORMAL LOW (ref 3.5–5.0)
Alkaline Phosphatase: 57 U/L (ref 38–126)
Anion gap: 8 (ref 5–15)
BUN: 17 mg/dL (ref 8–23)
CO2: 28 mmol/L (ref 22–32)
Calcium: 8.2 mg/dL — ABNORMAL LOW (ref 8.9–10.3)
Chloride: 102 mmol/L (ref 98–111)
Creatinine, Ser: 5.42 mg/dL — ABNORMAL HIGH (ref 0.44–1.00)
GFR, Estimated: 7 mL/min — ABNORMAL LOW (ref >60)
Glucose, Bld: 83 mg/dL (ref 70–99)
Potassium: 3.2 mmol/L — ABNORMAL LOW (ref 3.5–5.1)
Sodium: 138 mmol/L (ref 135–145)
Total Bilirubin: 0.7 mg/dL (ref 0.3–1.2)
Total Protein: 5.2 g/dL — ABNORMAL LOW (ref 6.5–8.1)

## 2023-05-01 LAB — CBC
HCT: 18.2 % — ABNORMAL LOW (ref 36.0–46.0)
Hemoglobin: 5.9 g/dL — CL (ref 12.0–15.0)
MCH: 36.9 pg — ABNORMAL HIGH (ref 26.0–34.0)
MCHC: 32.4 g/dL (ref 30.0–36.0)
MCV: 113.8 fL — ABNORMAL HIGH (ref 80.0–100.0)
Platelets: 218 10*3/uL (ref 150–400)
RBC: 1.6 MIL/uL — ABNORMAL LOW (ref 3.87–5.11)
RDW: 28.1 % — ABNORMAL HIGH (ref 11.5–15.5)
WBC: 8.5 10*3/uL (ref 4.0–10.5)
nRBC: 0 % (ref 0.0–0.2)

## 2023-05-01 LAB — PREPARE RBC (CROSSMATCH)

## 2023-05-01 LAB — TROPONIN I (HIGH SENSITIVITY)
Troponin I (High Sensitivity): 232 ng/L (ref <18)
Troponin I (High Sensitivity): 246 ng/L (ref <18)

## 2023-05-01 LAB — POC OCCULT BLOOD, ED: Fecal Occult Bld: NEGATIVE

## 2023-05-01 MED ORDER — SORBITOL 70 % SOLN
30.0000 mL | Freq: Every day | Status: DC | PRN
Start: 2023-05-01 — End: 2023-05-03

## 2023-05-01 MED ORDER — CHLORHEXIDINE GLUCONATE CLOTH 2 % EX PADS
6.0000 | MEDICATED_PAD | Freq: Every day | CUTANEOUS | Status: DC
  Administered 2023-05-01 – 2023-05-02 (×2): 6 via TOPICAL

## 2023-05-01 MED ORDER — ONDANSETRON HCL 4 MG/2ML IJ SOLN: 4.0000 mg | Freq: Four times a day (QID) | INTRAMUSCULAR | Status: DC | PRN

## 2023-05-01 MED ORDER — SODIUM CHLORIDE 0.9% IV SOLUTION: Freq: Once | INTRAVENOUS | Status: AC

## 2023-05-01 MED ORDER — MELATONIN 3 MG PO TABS: 3.0000 mg | ORAL_TABLET | Freq: Every evening | ORAL | Status: DC | PRN

## 2023-05-01 MED ORDER — ONDANSETRON HCL 4 MG PO TABS: 4.0000 mg | ORAL_TABLET | Freq: Four times a day (QID) | ORAL | Status: DC | PRN

## 2023-05-01 NOTE — ED Notes (Signed)
Pt SPO2 dropped to 87%. Pt placed on 3 L of O2, SPO2 now 100%.

## 2023-05-01 NOTE — ED Notes (Signed)
Talked to [facility] in lab and she said they have ordered a unit of antigen-negative specific type of blood for and per provider Dimas Chyle, Debarah Crape, MD) have ordered another unit for her to receive tomorrow with dialysis.

## 2023-05-01 NOTE — H&P (Signed)
History and Physical    Patient: Shawna [name]:[id number] DOB: [birth year] DOA: 05/01/2023 DOS: the patient was seen and examined on 05/01/2023 PCP: [facility]  Patient coming from: Home  Chief Complaint:  Chief Complaint  Patient presents with   Weakness   HPI: Shawna [name] is a 82 y.o. female with medical history significant for recurrent GI bleeds possibly due to AVMs,  paroxysmal atrial fibrillation no longer on anticoagulation due to recurrent GI bleed, end-stage renal disease on hemodialysis Monday/ Wednesday/Fridays, seizure disorder, and coronary artery disease status post CABG and PCI who was sent to the ER from dialysis when they found that her hemoglobin was 5.9.   The last endoscopy I see in her record was a small bowel enteroscopy completed May 2024.  That revealed a nonbleeding gastric ulcer.  She was on monthly IM octreotide injections. Husband gives most of the history.  He tells me that they check her hemoglobin frequently when she goes to dialysis because she has had so much trouble with severe anemia.  He tells me they are not able to give blood in the outpatient dialysis center so she has to be sent to the emergency department.  The patient denies seeing any dark stools lately.  She does have trouble with shortness of breath with exertion but that has been going on for several months.  She is weaker than usual but that also seems to be non acute. The patient denied chest pain or any pain, but the husband says she complains of pain all over that is quite severe frequently.  Review of Systems: As mentioned in the history of present illness. All other systems reviewed and are negative. Past Medical History:  Diagnosis Date   Acute on chronic respiratory failure with hypoxia (HCC) 10/10/2016   Anxiety    Arthritis    AVM (arteriovenous malformation) of colon    CAD (coronary artery disease)    a. s/p CABG in 2013 b. DES to D1 in 10/2016. c. cath in 07/2018  showing patent grafts with occlusion of D1 at prior stent site and progression of PDA disease --> medical management recommended   Carotid artery disease (HCC)    a. 60-79% LICA, 03/2012    Chronic bronchitis (HCC)    Chronic HFrEF (heart failure with reduced ejection fraction) (HCC)    Colon cancer (HCC) 1992   Esophageal stricture    ESRD on hemodialysis (HCC)    ESRD due to HTN, started dialysis 2011 and gets HD at [facility] with Dr Fausto Skillern on MWF schedule.  Access is LUA AVF as of Sept 2014.    GERD (gastroesophageal reflux disease)    High cholesterol 12/2011   History of blood transfusion 07/2011; 12/2011; 01/2012 X 2; 04/2012   History of gout    History of lower GI bleeding    Hypertension    Iron deficiency anemia    Jugular vein occlusion, right (HCC)    Mitral regurgitation    a. Moderate by echo, 02/2012   Mitral valve disease    NSVT (nonsustained ventricular tachycardia) (HCC)    Ovarian cancer (HCC) 1992   PAF (paroxysmal atrial fibrillation) (HCC)    Pneumonia ~ 2009   PUD (peptic ulcer disease)    TIA (transient ischemic attack)    Tricuspid valve disease    Past Surgical History:  Procedure Laterality Date   A/V FISTULAGRAM Left 10/04/2022   Procedure: A/V Fistulagram;  Surgeon: Renford Dills, MD;  Location: [facility]  INVASIVE CV LAB;  Service: Cardiovascular;  Laterality: Left;   A/V SHUNTOGRAM Left 03/19/2019   Procedure: A/V SHUNTOGRAM;  Surgeon: Renford Dills, MD;  Location: [facility] INVASIVE CV LAB;  Service: Cardiovascular;  Laterality: Left;   ABDOMINAL HYSTERECTOMY  1992   APPENDECTOMY  06/1990   AV FISTULA PLACEMENT  07/2009   left upper arm   AV FISTULA PLACEMENT Right 09/06/2016   Procedure: RIGHT FOREARM ARTERIOVENOUS (AV) GRAFT;  Surgeon: Sherren Kerns, MD;  [facility]: [facility] OR;  Service: Vascular;  Laterality: Right;   AV FISTULA PLACEMENT N/A 02/24/2017   Procedure: INSERTION OF ARTERIOVENOUS (AV) GORE-TEX GRAFT ARM (BRACHIAL AXILLARY);  Surgeon:  Renford Dills, MD;  Location: [facility] ORS;  Service: Vascular;  Laterality: N/A;   AVGG REMOVAL Right 09/06/2016   Procedure: REMOVAL OF Right Arm ARTERIOVENOUS GORETEX GRAFT and Vein Patch angioplasty of brachial artery;  Surgeon: Chuck Hint, MD;  Location[facility] OR;  Service: Vascular;  Laterality: Right;   BIOPSY  09/26/2019   Procedure: BIOPSY;  Surgeon: Malissa Hippo, MD;  [facility] ENDO SU[facility];  Service: Endoscopy;;   BIOPSY  11/01/2022   Procedure: BIOPSY;  Surgeon: Dolores Frame, MD;  [facility];  Service: Gastroenterology;;   COLON RESECTION  1992   COLON SURGERY     COLONOSCOPY N/A 03/09/2019   Procedure: COLONOSCOPY;  Surgeon: Malissa Hippo, MD;  [facility];  Service: Endoscopy;  Laterality: N/A;   COLONOSCOPY N/A 08/11/2022   Procedure: COLONOSCOPY;  Surgeon: Beverley Fiedler, MD;  Location: [facility] ENDOSCOPY;  Service: Gastroenterology;  Laterality: N/A;   COLONOSCOPY WITH PROPOFOL N/A 06/08/2021   Procedure: COLONOSCOPY WITH PROPOFOL;  Surgeon: Dolores Frame, MD;  [facility];  Service: Gastroenterology;  Laterality: N/A;  9:05 /Patient is on dialysis Mon Wed Fri   CORONARY ANGIOPLASTY WITH STENT PLACEMENT  12/15/11   "2"   CORONARY ANGIOPLASTY WITH STENT PLACEMENT  y/2013   "1; makes total of 3" (05/02/2012)   CORONARY ARTERY BYPASS GRAFT  06/13/2012   Procedure: CORONARY ARTERY BYPASS GRAFTING (CABG);  Surgeon: Delight Ovens, MD;  Location: [facility] OR;  Service: Open Heart Surgery;  Laterality: N/A;  cabg x four;  using left internal mammary artery, and left leg greater saphenous vein harvested endoscopically   CORONARY STENT INTERVENTION N/A 10/13/2016   Procedure: Coronary Stent Intervention;  Surgeon: Lennette Bihari, MD;  [facility] INVASIVE CV LAB;  Service: Cardiovascular;  Laterality: N/A;   DIALYSIS/PERMA CATHETER REMOVAL N/A 04/18/2017   Procedure: DIALYSIS/PERMA CATHETER REMOVAL;  Surgeon: Renford Dills, MD;  Location: [facility] INVASIVE CV LAB;  Service: Cardiovascular;  Laterality: N/A;   DILATION AND CURETTAGE OF UTERUS     ENTEROSCOPY N/A 06/08/2021   Procedure: PUSH ENTEROSCOPY;  Surgeon: Dolores Frame, MD;  [facility];  Service: Gastroenterology;  Laterality: N/A;   ENTEROSCOPY N/A 11/01/2022   Procedure: ENTEROSCOPY;  Surgeon: Dolores Frame, MD;  [facility];  Service: Gastroenterology;  Laterality: N/A;   ENTEROSCOPY N/A 12/01/2022   Procedure: ENTEROSCOPY;  Surgeon: Benancio Deeds, MD;  Location: [facility] ENDOSCOPY;  Service: Gastroenterology;  Laterality: N/A;   ESOPHAGOGASTRODUODENOSCOPY  01/20/2012   Procedure: ESOPHAGOGASTRODUODENOSCOPY (EGD);  Surgeon: Meryl Dare, MD,FACG;  Location: [facility] ENDOSCOPY;  Service: Endoscopy;  Laterality: N/A;   ESOPHAGOGASTRODUODENOSCOPY N/A 03/26/2013   Procedure: ESOPHAGOGASTRODUODENOSCOPY (EGD);  Surgeon: Hilarie Fredrickson, MD;  Location: [facility] ENDOSCOPY;  Service: Endoscopy;  Laterality: N/A;  ESOPHAGOGASTRODUODENOSCOPY N/A 04/30/2015   Procedure: ESOPHAGOGASTRODUODENOSCOPY (EGD);  Surgeon: Malissa Hippo, MD;  [facility];  Service: Endoscopy;  Laterality: N/A;  1pm - moved to 10/20 @ 1:10   ESOPHAGOGASTRODUODENOSCOPY N/A 07/29/2016   Procedure: ESOPHAGOGASTRODUODENOSCOPY (EGD);  Surgeon: Ruffin Frederick, MD;  Location: [facility] ENDOSCOPY;  Service: Gastroenterology;  Laterality: N/A;  enteroscopy   ESOPHAGOGASTRODUODENOSCOPY N/A 09/26/2019   Procedure: ESOPHAGOGASTRODUODENOSCOPY (EGD);  Surgeon: Malissa Hippo, MD;  [facility];  Service: Endoscopy;  Laterality: N/A;  1250   ESOPHAGOGASTRODUODENOSCOPY N/A 08/11/2022   Procedure: ESOPHAGOGASTRODUODENOSCOPY (EGD);  Surgeon: Beverley Fiedler, MD;  Location: [facility] ENDOSCOPY;  Service: Gastroenterology;  Laterality: N/A;   ESOPHAGOGASTRODUODENOSCOPY (EGD) WITH PROPOFOL N/A 02/05/2021   Procedure: ESOPHAGOGASTRODUODENOSCOPY (EGD) WITH PROPOFOL;   Surgeon: Lanelle Bal, DO;  [facility];  Service: Endoscopy;  Laterality: N/A;   ESOPHAGOGASTRODUODENOSCOPY (EGD) WITH PROPOFOL N/A 08/27/2022   Procedure: ESOPHAGOGASTRODUODENOSCOPY (EGD) WITH PROPOFOL;  Surgeon: Lanelle Bal, DO;  [facility];  Service: Endoscopy;  Laterality: N/A;   GIVENS CAPSULE STUDY N/A 03/07/2019   Procedure: GIVENS CAPSULE STUDY;  Surgeon: Malissa Hippo, MD;  [facility];  Service: Endoscopy;  Laterality: N/A;  7:30   GIVENS CAPSULE STUDY N/A 04/22/2021   Procedure: GIVENS CAPSULE STUDY;  Surgeon: Malissa Hippo, MD;  [facility];  Service: Endoscopy;  Laterality: N/A;  7:30   GIVENS CAPSULE STUDY N/A 08/27/2022   Procedure: GIVENS CAPSULE STUDY;  Surgeon: Lanelle Bal, DO;  [facility];  Service: Endoscopy;  Laterality: N/A;   HOT HEMOSTASIS N/A 08/11/2022   Procedure: HOT HEMOSTASIS (ARGON PLASMA COAGULATION/BICAP);  Surgeon: Beverley Fiedler, MD;  Location: [facility] ENDOSCOPY;  Service: Gastroenterology;  Laterality: N/A;   HOT HEMOSTASIS N/A 12/01/2022   Procedure: HOT HEMOSTASIS (ARGON PLASMA COAGULATION/BICAP);  Surgeon: Benancio Deeds, MD;  Location: [facility] ENDOSCOPY;  Service: Gastroenterology;  Laterality: N/A;   INSERTION OF DIALYSIS CATHETER N/A 10/05/2020   Procedure: ABORTED TUNNELED DIALYSIS CATHETER PLACEMENT RIGHT INTERNAL JUGULAR VEIN ;  Surgeon: Lucretia Roers, MD;  Location: [facility] ORS;  Service: General;  Laterality: N/A;   INTRAOPERATIVE TRANSESOPHAGEAL ECHOCARDIOGRAM  06/13/2012   Procedure: INTRAOPERATIVE TRANSESOPHAGEAL ECHOCARDIOGRAM;  Surgeon: Delight Ovens, MD;  Location: [facility] OR;  Service: Open Heart Surgery;  Laterality: N/A;   IR DIALY SHUNT INTRO NEEDLE/INTRACATH INITIAL W/IMG LEFT Left 10/06/2020   IR FLUORO GUIDE CV LINE RIGHT  06/17/2020   IR FLUORO GUIDE CV LINE RIGHT  11/12/2022   IR GENERIC HISTORICAL  07/26/2016   IR FLUORO GUIDE CV LINE RIGHT 07/26/2016 Berdine Dance, MD  [facility]   IR GENERIC HISTORICAL  07/26/2016   IR US GUIDE VASC ACCESS RIGHT 07/26/2016 Berdine Dance, MD [facility]   IR GENERIC HISTORICAL  08/02/2016   IR US GUIDE VASC ACCESS RIGHT 08/02/2016 Berdine Dance, MD [facility]   IR GENERIC HISTORICAL  08/02/2016   IR FLUORO GUIDE CV LINE RIGHT 08/02/2016 Berdine Dance, MD [facility]   IR RADIOLOGY PERIPHERAL GUIDED IV START  03/28/2017   IR REMOVAL TUN CV CATH W/O FL  08/11/2020   IR REMOVAL TUN CV CATH W/O FL  11/15/2022   IR THROMBECTOMY AV FISTULA W/THROMBOLYSIS INC/SHUNT/IMG LEFT Left 06/17/2020   IR US GUIDE VASC ACCESS LEFT  06/17/2020   IR US GUIDE VASC ACCESS RIGHT  03/28/2017   IR US GUIDE VASC ACCESS RIGHT  06/17/2020   IR US GUIDE VASC ACCESS  RIGHT  11/12/2022   LEFT HEART CATH AND CORONARY ANGIOGRAPHY N/A 09/20/2016   Procedure: Left Heart Cath and Coronary Angiography;  Surgeon: Lyn Records, MD;  Location: [facility] INVASIVE CV LAB;  Service: Cardiovascular;  Laterality: N/A;   LEFT HEART CATH AND CORS/GRAFTS ANGIOGRAPHY N/A 10/13/2016   Procedure: Left Heart Cath and Cors/Grafts Angiography;  Surgeon: Lennette Bihari, MD;  [facility] INVASIVE CV LAB;  Service: Cardiovascular;  Laterality: N/A;   LEFT HEART CATH AND CORS/GRAFTS ANGIOGRAPHY N/A 07/13/2018   Procedure: LEFT HEART CATH AND CORS/GRAFTS ANGIOGRAPHY;  Surgeon: Swaziland, Peter M, MD;  Location: [facility] INVASIVE CV LAB;  Service: Cardiovascular;  Laterality: N/A;   LEFT HEART CATH AND CORS/GRAFTS ANGIOGRAPHY N/A 07/22/2021   Procedure: LEFT HEART CATH AND CORS/GRAFTS ANGIOGRAPHY;  Surgeon: Runell Gess, MD;  [facility]: [facility] INVASIVE CV LAB;  Service: Cardiovascular;  Laterality: N/A;   LEFT HEART CATHETERIZATION WITH CORONARY ANGIOGRAM N/A 12/15/2011   Procedure: LEFT HEART CATHETERIZATION WITH CORONARY ANGIOGRAM;  Surgeon: Kathleene Hazel, MD;  Location: [facility] CATH LAB;  Service: Cardiovascular;  Laterality: N/A;   LEFT HEART CATHETERIZATION WITH CORONARY ANGIOGRAM N/A 01/10/2012    Procedure: LEFT HEART CATHETERIZATION WITH CORONARY ANGIOGRAM;  Surgeon: Peter M Swaziland, MD;  Location: [facility] CATH LAB;  Service: Cardiovascular;  Laterality: N/A;   LEFT HEART CATHETERIZATION WITH CORONARY ANGIOGRAM N/A 06/08/2012   Procedure: LEFT HEART CATHETERIZATION WITH CORONARY ANGIOGRAM;  Surgeon: Kathleene Hazel, MD;  Location: [facility] CATH LAB;  Service: Cardiovascular;  Laterality: N/A;   LEFT HEART CATHETERIZATION WITH CORONARY/GRAFT ANGIOGRAM N/A 12/10/2013   Procedure: LEFT HEART CATHETERIZATION WITH Isabel Caprice;  Surgeon: Corky Crafts, MD;  Location: [facility] CATH LAB;  Service: Cardiovascular;  Laterality: N/A;   OVARY SURGERY     ovarian cancer   POLYPECTOMY  03/09/2019   Procedure: POLYPECTOMY;  Surgeon: Malissa Hippo, MD;  [facility];  Service: Endoscopy;;  cecal    POLYPECTOMY N/A 09/26/2019   Procedure: DUODENAL POLYPECTOMY;  Surgeon: Malissa Hippo, MD;  [facility];  Service: Endoscopy;  Laterality: N/A;   POLYPECTOMY  08/11/2022   Procedure: POLYPECTOMY;  Surgeon: Beverley Fiedler, MD;  Location: [facility] ENDOSCOPY;  Service: Gastroenterology;;   REVISION OF ARTERIOVENOUS GORETEX GRAFT N/A 02/24/2017   Procedure: REVISION OF ARTERIOVENOUS GORETEX GRAFT (RESECTION);  Surgeon: Renford Dills, MD;  Location: [facility] ORS;  Service: Vascular;  Laterality: N/A;   REVISON OF ARTERIOVENOUS FISTULA Left 06/19/2020   Procedure: REVISION OF LEFT UPPER ARM AV GRAFT WITH INTERPOSITION JUMP GRAFT USING GORE LIMB;  Surgeon: Cephus Shelling, MD;  Location: [facility] OR;  Service: Vascular;  Laterality: Left;   SHUNTOGRAM N/A 10/15/2013   Procedure: Fistulogram;  Surgeon: Nada Libman, MD;  Location: [facility] CATH LAB;  Service: Cardiovascular;  Laterality: N/A;   SUBMUCOSAL TATTOO INJECTION  11/01/2022   Procedure: SUBMUCOSAL TATTOO INJECTION;  Surgeon: Dolores Frame, MD;  [facility];  Service: Gastroenterology;;   THROMBECTOMY /  ARTERIOVENOUS GRAFT REVISION  2011   left upper arm   TUBAL LIGATION  1980's   UPPER EXTREMITY ANGIOGRAPHY Bilateral 12/06/2016   Procedure: Upper Extremity Angiography;  Surgeon: Renford Dills, MD;  Location: [facility] INVASIVE CV LAB;  Service: Cardiovascular;  Laterality: Bilateral;   UPPER EXTREMITY INTERVENTION Left 06/06/2017   Procedure: UPPER EXTREMITY INTERVENTION;  Surgeon: Renford Dills, MD;  Location: [facility] INVASIVE CV LAB;  Service: Cardiovascular;  Laterality: Left;   UPPER EXTREMITY VENOGRAPHY Left 10/04/2022  Procedure: UPPER EXTREMITY VENOGRAPHY;  Surgeon: Renford Dills, MD;  Location: [facility] INVASIVE CV LAB;  Service: Cardiovascular;  Laterality: Left;   Social History:  reports that she has never smoked. She has never used smokeless tobacco. She reports that she does not drink alcohol and does not use drugs.  Allergies  Allergen Reactions   Amlodipine Swelling   Aspirin Other (See Comments)    High Doses Mess up her stomach; "makes my bowels have blood in them". Takes 81 mg EC Aspirin    Nitrofurantoin Hives   Ranexa [Ranolazine] Other (See Comments)    Myoclonus-hospitalized    Bactrim [Sulfamethoxazole-Trimethoprim] Rash   Iodinated Contrast Media Itching and Other (See Comments)   Iron Itching and Other (See Comments)    "they gave me iron in dialysis; had to give me Benadryl cause I had to have the iron" (05/02/2012)   Tylenol [Acetaminophen] Itching and Other (See Comments)    Makes her feet on fire per pt - can tolerate per pt   Gabapentin Other (See Comments)    Unknown reaction   Sucroferric Oxyhydroxide Other (See Comments)    Unknown   Levaquin [Levofloxacin In D5w] Rash   Levofloxacin Rash and Other (See Comments)   Plavix [Clopidogrel Bisulfate] Rash   Protonix [Pantoprazole Sodium] Rash   Venofer [Ferric Oxide] Itching and Other (See Comments)    Patient reports using Benadryl prior to doses as [facility]    Family History  Problem  Relation Age of Onset   Heart disease Mother        Heart Disease before age 37   Hyperlipidemia Mother    Hypertension Mother    Diabetes Mother    Heart attack Mother    Heart disease Father        Heart Disease before age 89   Hyperlipidemia Father    Hypertension Father    Diabetes Father    Diabetes Sister    Hypertension Sister    Diabetes Brother    Hyperlipidemia Brother    Heart attack Brother    Hypertension Sister    Heart attack Brother    Colon cancer Child 12   Other Other        noncontributory for early CAD   Esophageal cancer Neg Hx    Liver disease Neg Hx    Kidney disease Neg Hx    Colon polyps Neg Hx     Prior to Admission medications   Medication Sig Start Date End Date Taking? Authorizing Provider  ALPRAZolam Prudy Feeler) 0.5 MG tablet Take 1 tablet (0.5 mg total) by mouth daily as needed for up to 4 doses for anxiety. 12/19/22   Lanae Boast, MD  amiodarone (PACERONE) 100 MG tablet Take 1 tablet (100 mg total) by mouth daily. 02/02/23 03/08/23  Sherryll Burger, Pratik D, DO  benzonatate (TESSALON) 100 MG capsule Take 1 capsule (100 mg total) by mouth 3 (three) times daily as needed for cough. 01/18/23   Tilden Fossa, MD  calcitRIOL (ROCALTROL) 0.25 MCG capsule Take 7 capsules (1.75 mcg total) by mouth every Monday, Wednesday, and Friday with hemodialysis. 12/21/22   Lanae Boast, MD  cinacalcet (SENSIPAR) 30 MG tablet Take 1 tablet (30 mg total) by mouth every Monday, Wednesday, and Friday with hemodialysis. 12/19/22   Lanae Boast, MD  cyanocobalamin (VITAMIN B12) 1000 MCG/ML injection Inject 1,000 mcg into the muscle every 30 (thirty) days.    [provider]  cyclobenzaprine (FLEXERIL) 5 MG tablet Take 5 mg by  mouth 3 (three) times daily as needed.    [provider]  folic acid (FOLVITE) 1 MG tablet Take 1 mg by mouth daily. 05/24/22   [provider]  gabapentin (NEURONTIN) 100 MG capsule Take 1 capsule (100 mg total) by mouth every evening. 12/13/22    Jerald Kief, MD  guaiFENesin-codeine 100-10 MG/5ML syrup Take 5 mLs by mouth every 4 (four) hours as needed for cough. 10/23/22   Jacalyn Lefevre, MD  hydrALAZINE (APRESOLINE) 25 MG tablet Take 1 tablet (25 mg total) by mouth 2 (two) times daily. Hold for sbp <110 12/27/22   Lanae Boast, MD  hydrOXYzine (ATARAX) 25 MG tablet Take 25 mg by mouth every 6 (six) hours as needed for itching. 06/06/22   [provider]  levETIRAcetam (KEPPRA) 500 MG tablet Take 1 tablet (500 mg total) by mouth daily. 11/15/22   Leroy Sea, MD  levothyroxine (SYNTHROID) 75 MCG tablet Take 1 tablet (75 mcg total) by mouth daily at 6 (six) AM. 02/02/23   Sherryll Burger, Pratik D, DO  metoprolol tartrate (LOPRESSOR) 25 MG tablet Take 0.5 tablets (12.5 mg total) by mouth 2 (two) times daily. 02/01/23 03/08/23  Sherryll Burger, Pratik D, DO  multivitamin (RENA-VIT) TABS tablet Take 1 tablet by mouth daily.    [provider]  nitroGLYCERIN (NITROSTAT) 0.4 MG SL tablet Place 0.4 mg under the tongue every 5 (five) minutes as needed for chest pain.    [provider]  octreotide (SANDOSTATIN LAR) 20 MG injection Inject 20 mg into the muscle every 28 (twenty-eight) days. Patient not taking: Reported on 03/08/2023 11/15/22   Leroy Sea, MD  olmesartan (BENICAR) 20 MG tablet Take 20 mg by mouth daily. 01/14/23   [provider]  omeprazole (PRILOSEC) 20 MG capsule Take 1 capsule (20 mg total) by mouth 2 (two) times daily before a meal. Patient taking differently: Take 20 mg by mouth as needed (acid reflux). 02/01/23 03/08/23  Sherryll Burger, Pratik D, DO  ondansetron (ZOFRAN) 4 MG tablet Take 4 mg by mouth every 8 (eight) hours as needed. 02/27/23   [provider]  rosuvastatin (CRESTOR) 10 MG tablet Take 1 tablet by mouth once daily 06/06/22   Ellsworth Lennox, [place]    Physical Exam: Vitals:   05/01/23 1700 05/01/23 1730 05/01/23 1742 05/01/23 1830  BP:   (!) 140/57 (!) 166/63  Pulse: 70 71 69 70  Resp:  (!) 21 (!) 22 16 18   Temp:      TempSrc:      SpO2: 100% 100% 100% 100%  Weight:       Physical Exam:  General: No acute distress, frail HEENT: Normocephalic, atraumatic, PERRL Cardiovascular: Normal rate and rhythm.  Pulmonary: Normal pulmonary effort, normal breath sounds Gastrointestinal: Nondistended abdomen, soft, non-tender, normoactive bowel sounds, no organomegaly Musculoskeletal:Normal ROM, 1+lower ext edema, legs tender when squeezed Lymphadenopathy: No cervical LAD. Skin: Skin is warm and dry. Neuro: No focal deficits noted, AAOx3. PSYCH: Attentive and cooperative  Data Reviewed:  Results for orders placed or performed during the hospital encounter of 05/01/23 (from the past 24 hour(s))  Type and screen [facility]     Status: None   Collection Time: 05/01/23  2:44 PM  Result Value Ref Range   ABO/RH(D) O POS    Antibody Screen NEG    Sample Expiration      05/04/2023,2359 Performed at [facility], [address], [place], [place] [address]   Comprehensive metabolic panel  Status: Abnormal   Collection Time: 05/01/23  2:56 PM  Result Value Ref Range   Sodium 138 135 - 145 mmol/L   Potassium 3.2 (L) 3.5 - 5.1 mmol/L   Chloride 102 98 - 111 mmol/L   CO2 28 22 - 32 mmol/L   Glucose, Bld 83 70 - 99 mg/dL   BUN 17 8 - 23 mg/dL   Creatinine, Ser 4.25 (H) 0.44 - 1.00 mg/dL   Calcium 8.2 (L) 8.9 - 10.3 mg/dL   Total Protein 5.2 (L) 6.5 - 8.1 g/dL   Albumin 1.9 (L) 3.5 - 5.0 g/dL   AST 25 15 - 41 U/L   ALT 14 0 - 44 U/L   Alkaline Phosphatase 57 38 - 126 U/L   Total Bilirubin 0.7 0.3 - 1.2 mg/dL   GFR, Estimated 7 (L) >60 mL/min   Anion gap 8 5 - 15  CBC     Status: Abnormal   Collection Time: 05/01/23  2:56 PM  Result Value Ref Range   WBC 8.5 4.0 - 10.5 K/uL   RBC 1.60 (L) 3.87 - 5.11 MIL/uL   Hemoglobin 5.9 (LL) 12.0 - 15.0 g/dL   HCT 95.6 (L) 38.7 - 56.4 %   MCV 113.8 (H) 80.0 - 100.0 fL   MCH 36.9 (H) 26.0 - 34.0 pg   MCHC 32.4 30.0 - 36.0  g/dL   RDW 33.2 (H) 95.1 - 88.4 %   Platelets 218 150 - 400 K/uL   nRBC 0.0 0.0 - 0.2 %  Prepare RBC (crossmatch)     Status: None   Collection Time: 05/01/23  4:37 PM  Result Value Ref Range   Order Confirmation      ORDER PROCESSED BY BLOOD BANK Performed at [facility], [address], [place], [place] [address]   Troponin I (High Sensitivity)     Status: Abnormal   Collection Time: 05/01/23  4:37 PM  Result Value Ref Range   Troponin I (High Sensitivity) 232 (HH) <18 ng/L  POC occult blood, ED     Status: None   Collection Time: 05/01/23  4:44 PM  Result Value Ref Range   Fecal Occult Bld NEGATIVE NEGATIVE   *Note: Due to a large number of results and/or encounters for the requested time period, some results have not been displayed. A complete set of results can be found in Results Review.     Assessment and Plan: Blood loss anemia -Per nephrology recommendation, transfuse 1 unit packed red blood cells now and the patient will receive a second unit of packed red blood cells with hemodialysis in the morning.    Her blood is going to be sent up from [facility] because of her antibodies.  2. Pain all over- reassess after transfusions  3.  Elevated troponin -cycle and follow.  She did not have chest pain today but did have some chest pain yesterday and received nitroglycerin.  4.ESRD -usual dialysis Monday Wednesday Friday.  5. Seizure disorder -the patient's outpatient medication list is not yet verified but we will continue any seizure medicines seen on her computer list for now.  6. Pain all over/ multiple joints - reassess once hemoglobin corrected.   Advance Care Planning:   Code Status: Full Code The patient names her husband as her surrogate decision-maker and she wants to be full code.  Consults: nephrology  Family Communication: Husband at bedside  Severity of Illness: The appropriate patient status for this patient is INPATIENT. Inpatient status is judged to be  reasonable and necessary in order to provide the required intensity of service to ensure the patient's safety. The patient's presenting symptoms, physical exam findings, and initial radiographic and laboratory data in the context of their chronic comorbidities is felt to place them at high risk for further clinical deterioration. Furthermore, it is not anticipated that the patient will be medically stable for discharge from the hospital within 2 midnights of admission.   * I certify that at the point of admission it is my clinical judgment that the patient will require inpatient hospital care spanning beyond 2 midnights from the point of admission due to high intensity of service, high risk for further deterioration and high frequency of surveillance required.*  Author: Buena Irish, MD 05/01/2023 6:56 PM  For on call review [email or web address].

## 2023-05-01 NOTE — ED Provider Notes (Signed)
[place] EMERGENCY DEPARTMENT AT [facility] Provider Note   CSN: [id number] Arrival date & time: 05/01/23  1334     History Chief Complaint  Patient presents with   Weakness    Shawna [name] is a 82 y.o. female with h/o ESRD on MWF dialysis, hypothyroidism, CAD presents emerged from today for evaluation of low hemoglobin.  Patient was asked to go to dialysis today however they turned her away and sent her to the emergency department due to the low hemoglobin.  Has not reports that it was in the 5s.  The patient reports that she has been feeling more short of breath over the past few days.  Denies any cough.  Nursing note mentions chest pain however patient denied this with me.  She reports that she feels like she cannot speak in a full sentence sometimes over the past few days.  She reports generalized weakness/fatigue.  No unilateral weakness.  She denies any black or bloody stools.  She denies any belly pain.  The patient reports that she is compliant with dialysis and attended her last session on Friday for the full session.  Weakness Associated symptoms: shortness of breath   Associated symptoms: no abdominal pain, no chest pain, no cough, no diarrhea, no fever, no nausea and no vomiting        Home Medications Prior to Admission medications   Medication Sig Start Date End Date Taking? Authorizing Provider  ALPRAZolam Prudy Feeler) 0.5 MG tablet Take 1 tablet (0.5 mg total) by mouth daily as needed for up to 4 doses for anxiety. 12/19/22   Lanae Boast, MD  amiodarone (PACERONE) 100 MG tablet Take 1 tablet (100 mg total) by mouth daily. 02/02/23 03/08/23  Sherryll Burger, Pratik D, DO  benzonatate (TESSALON) 100 MG capsule Take 1 capsule (100 mg total) by mouth 3 (three) times daily as needed for cough. 01/18/23   Tilden Fossa, MD  calcitRIOL (ROCALTROL) 0.25 MCG capsule Take 7 capsules (1.75 mcg total) by mouth every Monday, Wednesday, and Friday with hemodialysis. 12/21/22   Lanae Boast, MD   cinacalcet (SENSIPAR) 30 MG tablet Take 1 tablet (30 mg total) by mouth every Monday, Wednesday, and Friday with hemodialysis. 12/19/22   Lanae Boast, MD  cyanocobalamin (VITAMIN B12) 1000 MCG/ML injection Inject 1,000 mcg into the muscle every 30 (thirty) days.    [provider]  cyclobenzaprine (FLEXERIL) 5 MG tablet Take 5 mg by mouth 3 (three) times daily as needed.    [provider]  folic acid (FOLVITE) 1 MG tablet Take 1 mg by mouth daily. 05/24/22   [provider]  gabapentin (NEURONTIN) 100 MG capsule Take 1 capsule (100 mg total) by mouth every evening. 12/13/22   Jerald Kief, MD  guaiFENesin-codeine 100-10 MG/5ML syrup Take 5 mLs by mouth every 4 (four) hours as needed for cough. 10/23/22   Jacalyn Lefevre, MD  hydrALAZINE (APRESOLINE) 25 MG tablet Take 1 tablet (25 mg total) by mouth 2 (two) times daily. Hold for sbp <110 12/27/22   Lanae Boast, MD  hydrOXYzine (ATARAX) 25 MG tablet Take 25 mg by mouth every 6 (six) hours as needed for itching. 06/06/22   [provider]  levETIRAcetam (KEPPRA) 500 MG tablet Take 1 tablet (500 mg total) by mouth daily. 11/15/22   Leroy Sea, MD  levothyroxine (SYNTHROID) 75 MCG tablet Take 1 tablet (75 mcg total) by mouth daily at 6 (six) AM. 02/02/23   Sherryll Burger, Pratik D, DO  metoprolol tartrate (  LOPRESSOR) 25 MG tablet Take 0.5 tablets (12.5 mg total) by mouth 2 (two) times daily. 02/01/23 03/08/23  Sherryll Burger, Pratik D, DO  multivitamin (RENA-VIT) TABS tablet Take 1 tablet by mouth daily.    [provider]  nitroGLYCERIN (NITROSTAT) 0.4 MG SL tablet Place 0.4 mg under the tongue every 5 (five) minutes as needed for chest pain.    [provider]  octreotide (SANDOSTATIN LAR) 20 MG injection Inject 20 mg into the muscle every 28 (twenty-eight) days. Patient not taking: Reported on 03/08/2023 11/15/22   Leroy Sea, MD  olmesartan (BENICAR) 20 MG tablet Take 20 mg by mouth daily. 01/14/23   [provider]  omeprazole (PRILOSEC) 20 MG capsule Take 1 capsule (20 mg total) by mouth 2 (two) times daily before a meal. Patient taking differently: Take 20 mg by mouth as needed (acid reflux). 02/01/23 03/08/23  Sherryll Burger, Pratik D, DO  ondansetron (ZOFRAN) 4 MG tablet Take 4 mg by mouth every 8 (eight) hours as needed. 02/27/23   [provider]  rosuvastatin (CRESTOR) 10 MG tablet Take 1 tablet by mouth once daily 06/06/22   [place] Ouch, [place] M, PA-C      Allergies    Amlodipine, Aspirin, Nitrofurantoin, Ranexa [ranolazine], Bactrim [sulfamethoxazole-trimethoprim], Iodinated contrast media, Iron, Tylenol [acetaminophen], Gabapentin, Sucroferric oxyhydroxide, Levaquin [levofloxacin in d5w], Levofloxacin, Plavix [clopidogrel bisulfate], Protonix [pantoprazole sodium], and Venofer [ferric oxide]    Review of Systems   Review of Systems  Constitutional:  Positive for fatigue. Negative for chills and fever.  HENT:  Negative for congestion and rhinorrhea.   Respiratory:  Positive for shortness of breath. Negative for cough.   Cardiovascular:  Negative for chest pain and palpitations.  Gastrointestinal:  Negative for abdominal pain, blood in stool, constipation, diarrhea, nausea and vomiting.    Physical Exam Updated Vital Signs BP (!) 125/47   Pulse 74   Temp 98.7 F (37.1 C) (Oral)   Resp 20   Wt 54.4 kg   SpO2 92%   BMI 21.95 kg/m  Physical Exam Vitals and nursing note reviewed.  Constitutional:      Appearance: She is not toxic-appearing.  Eyes:     General: No scleral icterus. Cardiovascular:     Rate and Rhythm: Normal rate.  Pulmonary:     Effort: Pulmonary effort is normal. No respiratory distress.     Comments: Almost able to speak a full sentence, but needed to take a breath.  Does not appear in any acute distress.  No tripoding or any tachypnea. Abdominal:     Palpations: Abdomen is soft.     Tenderness: There is no abdominal tenderness.  Skin:    General:  Skin is warm and dry.  Neurological:     Mental Status: She is alert.     ED Results / Procedures / Treatments   Labs (all labs ordered are listed, but only abnormal results are displayed) Labs Reviewed  COMPREHENSIVE METABOLIC PANEL  CBC  TYPE AND SCREEN    EKG None  Radiology No results found.  Procedures .Critical Care  Performed by: Achille Rich, PA-C Authorized by: Achille Rich, PA-C   Critical care provider statement:    Critical care time (minutes):  30   Critical care was necessary to treat or prevent imminent or life-threatening deterioration of the following conditions:  Circulatory failure (Symptomatic anemia)   Critical care was time spent personally by me on the following activities:  Development of treatment plan with patient or surrogate, discussions with  consultants, evaluation of patient's response to treatment, examination of patient, ordering and review of laboratory studies, ordering and review of radiographic studies, ordering and performing treatments and interventions, pulse oximetry, re-evaluation of patient's condition, review of old charts and obtaining history from patient or surrogate    Medications Ordered in ED Medications - No data to display  ED Course/ Medical Decision Making/ A&P Clinical Course as of 05/01/23 2219  Mon May 01, 2023  1608 Spoke with nephrology, Gerri Lins. Plan is for 1 unit of blood today, and she will have more with dialysis in the morning.  [RR]    Clinical Course User Index [RR] Achille Rich, PA-C                               Medical Decision Making Amount and/or Complexity of Data Reviewed Labs: ordered. Radiology: ordered.  Risk Decision regarding hospitalization.   82 y.o. female presents to the ER for evaluation of low hemoglobin. Differential diagnosis includes but is not limited to anemia chronic disease, GI bleed, low iron. Vital signs elevated BP, otherwise unremarkable. Physical exam as noted  above.   I independently reviewed and interpreted the patient's labs. CBC shows significant anemia with a hemoglobin at 5.9.  Platelets within normal limits.  No leukocytosis.  Appears patient's baseline hemoglobin is 8.5-9.  CMP does show slight hypokalemia of 3.2.  Creatinine at 3.52, again patient is known dialysis patient.  Decrease in calcium, protein, and albumin.  No LFT or other electrolyte abnormalities.  Troponins pending.  Chest x-ray pending radiologist read.  Per my own interpretation, does appear there is a small left pleural effusion that does appear to be worsening comparison to previous chest x-ray imaging.  Patient was post to be received at dialysis today however did not go to due to the anemia.  Likely she may be her symptoms will improve with dialysis tomorrow.  EKG shows left bundle branch block, however according to my attending attached to this chart, this was present in previous EKGs.  I consulted nephrology given the patient's is to go to dialysis today.  He recommends giving 1 unit tonight and the patient will receive more units of blood tomorrow during dialysis.  Her Hemoccult is negative.  She reports that she was having some shortness of breath but that is been going on for the past few days.  I see the nursing note mentions chest pain however patient did deny this to me.  She could be experiencing shortness of breath given her decreasing hemoglobin.  Labs for troponin are pending.  Chest x-ray pending.  Discussed with hospitalist who accepts admission..  Portions of this report may have been transcribed using voice recognition software. Every effort was made to ensure accuracy; however, inadvertent computerized transcription errors may be present.   Final Clinical Impression(s) / ED Diagnoses Final diagnoses:  Anemia, unspecified type    Rx / DC Orders ED Discharge Orders     None         Achille Rich, Cordelia Poche 05/01/23 2239    Bethann Berkshire, MD 05/03/23  1240

## 2023-05-01 NOTE — ED Triage Notes (Addendum)
C/o weakness and hemoglobin of 5 and sent by dialysis.  Patient reports not having dialysis today due to needing transfusion.  C/o intermittent cp last night and took 2 nitros with relief. Denies sob  MWF dialysis

## 2023-05-01 NOTE — ED Notes (Signed)
ED TO INPATIENT HANDOFF REPORT  ED Nurse Name and Phone #: Haze Justin [phone]  S Name/Age/Gender Shawna [name] 82 y.o. female Room/Bed: APA14/APA14  Code Status   Code Status: Full Code  Home/SNF/Other Home Patient oriented to: self, place, time, and situation Is this baseline? Yes   Triage Complete: Triage complete  Chief Complaint Acute blood loss anemia [D62]  Triage Note C/o weakness and hemoglobin of 5 and sent by dialysis.  Patient reports not having dialysis today due to needing transfusion.  C/o intermittent cp last night and took 2 nitros with relief. Denies sob  MWF dialysis   Allergies Allergies  Allergen Reactions   Amlodipine Swelling   Aspirin Other (See Comments)    High Doses Mess up her stomach; "makes my bowels have blood in them". Takes 81 mg EC Aspirin    Nitrofurantoin Hives   Ranexa [Ranolazine] Other (See Comments)    Myoclonus-hospitalized    Bactrim [Sulfamethoxazole-Trimethoprim] Rash   Iodinated Contrast Media Itching and Other (See Comments)   Iron Itching and Other (See Comments)    "they gave me iron in dialysis; had to give me Benadryl cause I had to have the iron" (05/02/2012)   Tylenol [Acetaminophen] Itching and Other (See Comments)    Makes her feet on fire per pt - can tolerate per pt   Gabapentin Other (See Comments)    Unknown reaction   Sucroferric Oxyhydroxide Other (See Comments)    Unknown   Levaquin [Levofloxacin In D5w] Rash   Levofloxacin Rash and Other (See Comments)   Plavix [Clopidogrel Bisulfate] Rash   Protonix [Pantoprazole Sodium] Rash   Venofer [Ferric Oxide] Itching and Other (See Comments)    Patient reports using Benadryl prior to doses as [facility]    Level of Care/Admitting Diagnosis ED Disposition     ED Disposition  Admit   Condition  --   Comment  Hospital Area: [facility] [[id number]]  Level of Care: Telemetry [5]  Covid Evaluation: Asymptomatic - no recent exposure (last 10  days) testing not required  Diagnosis: Acute blood loss anemia [130865]  Admitting Physician: Buena Irish [[id number]]  Attending Physician: Buena Irish [[id number]]  Certification:: I certify this patient will need inpatient services for at least 2 midnights  Expected Medical Readiness: 05/02/2023          B Medical/Surgery History Past Medical History:  Diagnosis Date   Acute on chronic respiratory failure with hypoxia (HCC) 10/10/2016   Anxiety    Arthritis    AVM (arteriovenous malformation) of colon    CAD (coronary artery disease)    a. s/p CABG in 2013 b. DES to D1 in 10/2016. c. cath in 07/2018 showing patent grafts with occlusion of D1 at prior stent site and progression of PDA disease --> medical management recommended   Carotid artery disease (HCC)    a. 60-79% LICA, 03/2012    Chronic bronchitis (HCC)    Chronic HFrEF (heart failure with reduced ejection fraction) (HCC)    Colon cancer (HCC) 1992   Esophageal stricture    ESRD on hemodialysis (HCC)    ESRD due to HTN, started dialysis 2011 and gets HD at [facility] with Dr Fausto Skillern on MWF schedule.  Access is LUA AVF as of Sept 2014.    GERD (gastroesophageal reflux disease)    High cholesterol 12/2011   History of blood transfusion 07/2011; 12/2011; 01/2012 X 2; 04/2012   History of gout    History of lower GI  bleeding    Hypertension    Iron deficiency anemia    Jugular vein occlusion, right (HCC)    Mitral regurgitation    a. Moderate by echo, 02/2012   Mitral valve disease    NSVT (nonsustained ventricular tachycardia) (HCC)    Ovarian cancer (HCC) 1992   PAF (paroxysmal atrial fibrillation) (HCC)    Pneumonia ~ 2009   PUD (peptic ulcer disease)    TIA (transient ischemic attack)    Tricuspid valve disease    Past Surgical History:  Procedure Laterality Date   A/V FISTULAGRAM Left 10/04/2022   Procedure: A/V Fistulagram;  Surgeon: Renford Dills, MD;  Location: [facility] INVASIVE CV LAB;  Service:  Cardiovascular;  Laterality: Left;   A/V SHUNTOGRAM Left 03/19/2019   Procedure: A/V SHUNTOGRAM;  Surgeon: Renford Dills, MD;  Location: [facility] INVASIVE CV LAB;  Service: Cardiovascular;  Laterality: Left;   ABDOMINAL HYSTERECTOMY  1992   APPENDECTOMY  06/1990   AV FISTULA PLACEMENT  07/2009   left upper arm   AV FISTULA PLACEMENT Right 09/06/2016   Procedure: RIGHT FOREARM ARTERIOVENOUS (AV) GRAFT;  Surgeon: Sherren Kerns, MD;  [facility]: [facility] OR;  Service: Vascular;  Laterality: Right;   AV FISTULA PLACEMENT N/A 02/24/2017   Procedure: INSERTION OF ARTERIOVENOUS (AV) GORE-TEX GRAFT ARM (BRACHIAL AXILLARY);  Surgeon: Renford Dills, MD;  Location: [facility] ORS;  Service: Vascular;  Laterality: N/A;   AVGG REMOVAL Right 09/06/2016   Procedure: REMOVAL OF Right Arm ARTERIOVENOUS GORETEX GRAFT and Vein Patch angioplasty of brachial artery;  Surgeon: Chuck Hint, MD;  Location: [facility] OR;  Service: Vascular;  Laterality: Right;   BIOPSY  09/26/2019   Procedure: BIOPSY;  Surgeon: Malissa Hippo, MD;  [facility];  Service: Endoscopy;;   BIOPSY  11/01/2022   Procedure: BIOPSY;  Surgeon: Dolores Frame, MD;  [facility];  Service: Gastroenterology;;   COLON RESECTION  1992   COLON SURGERY     COLONOSCOPY N/A 03/09/2019   Procedure: COLONOSCOPY;  Surgeon: Malissa Hippo, MD;  [facility];  Service: Endoscopy;  Laterality: N/A;   COLONOSCOPY N/A 08/11/2022   Procedure: COLONOSCOPY;  Surgeon: Beverley Fiedler, MD;  Location: [facility] ENDOSCOPY;  Service: Gastroenterology;  Laterality: N/A;   COLONOSCOPY WITH PROPOFOL N/A 06/08/2021   Procedure: COLONOSCOPY WITH PROPOFOL;  Surgeon: Dolores Frame, MD;  [facility];  Service: Gastroenterology;  Laterality: N/A;  9:05 /Patient is on dialysis Mon Wed Fri   CORONARY ANGIOPLASTY WITH STENT PLACEMENT  12/15/11   "2"   CORONARY ANGIOPLASTY WITH STENT PLACEMENT  y/2013   "1; makes total of 3"  (05/02/2012)   CORONARY ARTERY BYPASS GRAFT  06/13/2012   Procedure: CORONARY ARTERY BYPASS GRAFTING (CABG);  Surgeon: Delight Ovens, MD;  Location[facility] OR;  Service: Open Heart Surgery;  Laterality: N/A;  cabg x four;  using left internal mammary artery, and left leg greater saphenous vein harvested endoscopically   CORONARY STENT INTERVENTION N/A 10/13/2016   Procedure: Coronary Stent Intervention;  Surgeon: Lennette Bihari, MD;  [facility];  Service: Cardiovascular;  Laterality: N/A;   DIALYSIS/PERMA CATHETER REMOVAL N/A 04/18/2017   Procedure: DIALYSIS/PERMA CATHETER REMOVAL;  Surgeon: Renford Dills, MD;  Location: [facility] INVASIVE CV LAB;  Service: Cardiovascular;  Laterality: N/A;   DILATION AND CURETTAGE OF UTERUS     ENTEROSCOPY N/A 06/08/2021   Procedure: PUSH ENTEROSCOPY;  Surgeon: Dolores Frame, MD;  [facility];  Service: Gastroenterology;  Laterality: N/A;   ENTEROSCOPY N/A 11/01/2022   Procedure: ENTEROSCOPY;  Surgeon: Dolores Frame, MD;  [facility];  Service: Gastroenterology;  Laterality: N/A;   ENTEROSCOPY N/A 12/01/2022   Procedure: ENTEROSCOPY;  Surgeon: Benancio Deeds, MD;  Location: [facility] ENDOSCOPY;  Service: Gastroenterology;  Laterality: N/A;   ESOPHAGOGASTRODUODENOSCOPY  01/20/2012   Procedure: ESOPHAGOGASTRODUODENOSCOPY (EGD);  Surgeon: Meryl Dare, MD,FACG;  Location: [facility] ENDOSCOPY;  Service: Endoscopy;  Laterality: N/A;   ESOPHAGOGASTRODUODENOSCOPY N/A 03/26/2013   Procedure: ESOPHAGOGASTRODUODENOSCOPY (EGD);  Surgeon: Hilarie Fredrickson, MD;  Location: [name] ENDOSCOPY;  Service: Endoscopy;  Laterality: N/A;   ESOPHAGOGASTRODUODENOSCOPY N/A 04/30/2015   Procedure: ESOPHAGOGASTRODUODENOSCOPY (EGD);  Surgeon: Malissa Hippo, MD;  [facility];  Service: Endoscopy;  Laterality: N/A;  1pm - moved to 10/20 @ 1:10   ESOPHAGOGASTRODUODENOSCOPY N/A 07/29/2016   Procedure: ESOPHAGOGASTRODUODENOSCOPY (EGD);   Surgeon: Ruffin Frederick, MD;  Location: [facility] ENDOSCOPY;  Service: Gastroenterology;  Laterality: N/A;  enteroscopy   ESOPHAGOGASTRODUODENOSCOPY N/A 09/26/2019   Procedure: ESOPHAGOGASTRODUODENOSCOPY (EGD);  Surgeon: Malissa Hippo, MD;  [facility];  Service: Endoscopy;  Laterality: N/A;  1250   ESOPHAGOGASTRODUODENOSCOPY N/A 08/11/2022   Procedure: ESOPHAGOGASTRODUODENOSCOPY (EGD);  Surgeon: Beverley Fiedler, MD;  Location: [facility] ENDOSCOPY;  Service: Gastroenterology;  Laterality: N/A;   ESOPHAGOGASTRODUODENOSCOPY (EGD) WITH PROPOFOL N/A 02/05/2021   Procedure: ESOPHAGOGASTRODUODENOSCOPY (EGD) WITH PROPOFOL;  Surgeon: Lanelle Bal, DO;  [facility];  Service: Endoscopy;  Laterality: N/A;   ESOPHAGOGASTRODUODENOSCOPY (EGD) WITH PROPOFOL N/A 08/27/2022   Procedure: ESOPHAGOGASTRODUODENOSCOPY (EGD) WITH PROPOFOL;  Surgeon: Lanelle Bal, DO;  [facility];  Service: Endoscopy;  Laterality: N/A;   GIVENS CAPSULE STUDY N/A 03/07/2019   Procedure: GIVENS CAPSULE STUDY;  Surgeon: Malissa Hippo, MD;  [facility];  Service: Endoscopy;  Laterality: N/A;  7:30   GIVENS CAPSULE STUDY N/A 04/22/2021   Procedure: GIVENS CAPSULE STUDY;  Surgeon: Malissa Hippo, MD;  [facility];  Service: Endoscopy;  Laterality: N/A;  7:30   GIVENS CAPSULE STUDY N/A 08/27/2022   Procedure: GIVENS CAPSULE STUDY;  Surgeon: Lanelle Bal, DO;  [facility];  Service: Endoscopy;  Laterality: N/A;   HOT HEMOSTASIS N/A 08/11/2022   Procedure: HOT HEMOSTASIS (ARGON PLASMA COAGULATION/BICAP);  Surgeon: Beverley Fiedler, MD;  Location: [facility] ENDOSCOPY;  Service: Gastroenterology;  Laterality: N/A;   HOT HEMOSTASIS N/A 12/01/2022   Procedure: HOT HEMOSTASIS (ARGON PLASMA COAGULATION/BICAP);  Surgeon: Benancio Deeds, MD;  Location: [facility] ENDOSCOPY;  Service: Gastroenterology;  Laterality: N/A;   INSERTION OF DIALYSIS CATHETER N/A 10/05/2020   Procedure: ABORTED  TUNNELED DIALYSIS CATHETER PLACEMENT RIGHT INTERNAL JUGULAR VEIN ;  Surgeon: Lucretia Roers, MD;  Location: [facility] ORS;  Service: General;  Laterality: N/A;   INTRAOPERATIVE TRANSESOPHAGEAL ECHOCARDIOGRAM  06/13/2012   Procedure: INTRAOPERATIVE TRANSESOPHAGEAL ECHOCARDIOGRAM;  Surgeon: Delight Ovens, MD;  Location: [facility] OR;  Service: Open Heart Surgery;  Laterality: N/A;   IR DIALY SHUNT INTRO NEEDLE/INTRACATH INITIAL W/IMG LEFT Left 10/06/2020   IR FLUORO GUIDE CV LINE RIGHT  06/17/2020   IR FLUORO GUIDE CV LINE RIGHT  11/12/2022   IR GENERIC HISTORICAL  07/26/2016   IR FLUORO GUIDE CV LINE RIGHT 07/26/2016 Berdine Dance, MD [facility]   IR GENERIC HISTORICAL  07/26/2016   IR US GUIDE VASC ACCESS RIGHT 07/26/2016 Berdine Dance, MD [facility]   IR GENERIC HISTORICAL  08/02/2016  IR US GUIDE VASC ACCESS RIGHT 08/02/2016 Berdine Dance, MD [facility]   IR GENERIC HISTORICAL  08/02/2016   IR FLUORO GUIDE CV LINE RIGHT 08/02/2016 Berdine Dance, MD [facility]   IR RADIOLOGY PERIPHERAL GUIDED IV START  03/28/2017   IR REMOVAL TUN CV CATH W/O FL  08/11/2020   IR REMOVAL TUN CV CATH W/O FL  11/15/2022   IR THROMBECTOMY AV FISTULA W/THROMBOLYSIS INC/SHUNT/IMG LEFT Left 06/17/2020   IR US GUIDE VASC ACCESS LEFT  06/17/2020   IR US GUIDE VASC ACCESS RIGHT  03/28/2017   IR US GUIDE VASC ACCESS RIGHT  06/17/2020   IR US GUIDE VASC ACCESS RIGHT  11/12/2022   LEFT HEART CATH AND CORONARY ANGIOGRAPHY N/A 09/20/2016   Procedure: Left Heart Cath and Coronary Angiography;  Surgeon: Lyn Records, MD;  Location: [facility] INVASIVE CV LAB;  Service: Cardiovascular;  Laterality: N/A;   LEFT HEART CATH AND CORS/GRAFTS ANGIOGRAPHY N/A 10/13/2016   Procedure: Left Heart Cath and Cors/Grafts Angiography;  Surgeon: Lennette Bihari, MD;  [facility] LAB;  Service: Cardiovascular;  Laterality: N/A;   LEFT HEART CATH AND CORS/GRAFTS ANGIOGRAPHY N/A 07/13/2018   Procedure: LEFT HEART CATH AND CORS/GRAFTS ANGIOGRAPHY;  Surgeon:  Swaziland, Peter M, MD;  Location: [facility] INVASIVE CV LAB;  Service: Cardiovascular;  Laterality: N/A;   LEFT HEART CATH AND CORS/GRAFTS ANGIOGRAPHY N/A 07/22/2021   Procedure: LEFT HEART CATH AND CORS/GRAFTS ANGIOGRAPHY;  Surgeon: Runell Gess, MD;  [facility] INVAS[facility] CV LAB;  Service: Cardiovascular;  Laterality: N/A;   LEFT HEART CATHETERIZATION WITH CORONARY ANGIOGRAM N/A 12/15/2011   Procedure: LEFT HEART CATHETERIZATION WITH CORONARY ANGIOGRAM;  Surgeon: Kathleene Hazel, MD;  Location: [facility] CATH LAB;  Service: Cardiovascular;  Laterality: N/A;   LEFT HEART CATHETERIZATION WITH CORONARY ANGIOGRAM N/A 01/10/2012   Procedure: LEFT HEART CATHETERIZATION WITH CORONARY ANGIOGRAM;  Surgeon: Peter M Swaziland, MD;  Location: [facility] CATH LAB;  Service: Cardiovascular;  Laterality: N/A;   LEFT HEART CATHETERIZATION WITH CORONARY ANGIOGRAM N/A 06/08/2012   Procedure: LEFT HEART CATHETERIZATION WITH CORONARY ANGIOGRAM;  Surgeon: Kathleene Hazel, MD;  Location: [facility] CATH LAB;  Service: Cardiovascular;  Laterality: N/A;   LEFT HEART CATHETERIZATION WITH CORONARY/GRAFT ANGIOGRAM N/A 12/10/2013   Procedure: LEFT HEART CATHETERIZATION WITH Isabel Caprice;  Surgeon: Corky Crafts, MD;  Location: [facility] CATH LAB;  Service: Cardiovascular;  Laterality: N/A;   OVARY SURGERY     ovarian cancer   POLYPECTOMY  03/09/2019   Procedure: POLYPECTOMY;  Surgeon: Malissa Hippo, MD;  [facility];  Service: Endoscopy;;  cecal    POLYPECTOMY N/A 09/26/2019   Procedure: DUODENAL POLYPECTOMY;  Surgeon: Malissa Hippo, MD;  [facility];  Service: Endoscopy;  Laterality: N/A;   POLYPECTOMY  08/11/2022   Procedure: POLYPECTOMY;  Surgeon: Beverley Fiedler, MD;  Location: [facility] ENDOSCOPY;  Service: Gastroenterology;;   REVISION OF ARTERIOVENOUS GORETEX GRAFT N/A 02/24/2017   Procedure: REVISION OF ARTERIOVENOUS GORETEX GRAFT (RESECTION);  Surgeon: Renford Dills, MD;  Location: [facility] ORS;  Service:  Vascular;  Laterality: N/A;   REVISON OF ARTERIOVENOUS FISTULA Left 06/19/2020   Procedure: REVISION OF LEFT UPPER ARM AV GRAFT WITH INTERPOSITION JUMP GRAFT USING GORE LIMB;  Surgeon: Cephus Shelling, MD;  Location: [facility] OR;  Service: Vascular;  Laterality: Left;   SHUNTOGRAM N/A 10/15/2013   Procedure: Fistulogram;  Surgeon: Nada Libman, MD;  Location: [facility] CATH LAB;  Service: Cardiovascular;  Laterality: N/A;   SUBMUCOSAL  TATTOO INJECTION  11/01/2022   Procedure: SUBMUCOSAL TATTOO INJECTION;  Surgeon: Dolores Frame, MD;  [facility];  Service: Gastroenterology;;   THROMBECTOMY / ARTERIOVENOUS GRAFT REVISION  2011   left upper arm   TUBAL LIGATION  1980's   UPPER EXTREMITY ANGIOGRAPHY Bilateral 12/06/2016   Procedure: Upper Extremity Angiography;  Surgeon: Renford Dills, MD;  Location: [facility] INVASIVE CV LAB;  Service: Cardiovascular;  Laterality: Bilateral;   UPPER EXTREMITY INTERVENTION Left 06/06/2017   Procedure: UPPER EXTREMITY INTERVENTION;  Surgeon: Renford Dills, MD;  Location: [facility] INVASIVE CV LAB;  Service: Cardiovascular;  Laterality: Left;   UPPER EXTREMITY VENOGRAPHY Left 10/04/2022   Procedure: UPPER EXTREMITY VENOGRAPHY;  Surgeon: Renford Dills, MD;  Location: [facility] INVASIVE CV LAB;  Service: Cardiovascular;  Laterality: Left;     A IV Location/Drains/Wounds Patient Lines/Drains/Airways Status     Active Line/Drains/Airways     Name Placement date Placement time Site Days   Peripheral IV 05/01/23 20 G 1.88" Anterior;Right;Upper Arm 05/01/23  1444  Arm  less than 1   Fistula / Graft Left Upper arm Arteriovenous fistula 03/19/19  --  Upper arm  1504   Hemodialysis Catheter Left Subclavian Double lumen Temporary (Non-Tunneled) 06/09/22  --  Subclavian  326   Wound / Incision (Open or Dehisced) 11/14/21 Arm Left;Posterior;Proximal;Upper left proximal axilla large blackhead 11/14/21  1736  Arm  533            Intake/Output Last  24 hours No intake or output data in the 24 hours ending 05/01/23 1718  Labs/Imaging Results for orders placed or performed during the hospital encounter of 05/01/23 (from the past 48 hour(s))  Type and screen [facility]     Status: None   Collection Time: 05/01/23  2:44 PM  Result Value Ref Range   ABO/RH(D) O POS    Antibody Screen NEG    Sample Expiration      05/04/2023,2359 Performed at [facility], [address], [place], [place] [address]   Comprehensive metabolic panel     Status: Abnormal   Collection Time: 05/01/23  2:56 PM  Result Value Ref Range   Sodium 138 135 - 145 mmol/L   Potassium 3.2 (L) 3.5 - 5.1 mmol/L   Chloride 102 98 - 111 mmol/L   CO2 28 22 - 32 mmol/L   Glucose, Bld 83 70 - 99 mg/dL    Comment: Glucose reference range applies only to samples taken after fasting for at least 8 hours.   BUN 17 8 - 23 mg/dL   Creatinine, Ser 9.62 (H) 0.44 - 1.00 mg/dL   Calcium 8.2 (L) 8.9 - 10.3 mg/dL   Total Protein 5.2 (L) 6.5 - 8.1 g/dL   Albumin 1.9 (L) 3.5 - 5.0 g/dL   AST 25 15 - 41 U/L   ALT 14 0 - 44 U/L   Alkaline Phosphatase 57 38 - 126 U/L   Total Bilirubin 0.7 0.3 - 1.2 mg/dL   GFR, Estimated 7 (L) >60 mL/min    Comment: (NOTE) Calculated using the CKD-EPI Creatinine Equation (2021)    Anion gap 8 5 - 15    Comment: Performed at [facility], [address] [place], [place] [address]  CBC     Status: Abnormal   Collection Time: 05/01/23  2:56 PM  Result Value Ref Range   WBC 8.5 4.0 - 10.5 K/uL   RBC 1.60 (L) 3.87 - 5.11 MIL/uL   Hemoglobin 5.9 (LL) 12.0 -  15.0 g/dL    Comment: REPEATED TO VERIFY THIS CRITICAL RESULT HAS VERIFIED AND BEEN CALLED TO D Doraine Schexnider BY SHANA DALTON ON 10 21 2024 AT 1537, AND HAS BEEN READ BACK.     HCT 18.2 (L) 36.0 - 46.0 %   MCV 113.8 (H) 80.0 - 100.0 fL   MCH 36.9 (H) 26.0 - 34.0 pg   MCHC 32.4 30.0 - 36.0 g/dL   RDW 29.5 (H) 62.1 - 30.8 %   Platelets 218 150 - 400 K/uL    Comment: REPEATED TO  VERIFY PLATELET COUNT CONFIRMED BY SMEAR    nRBC 0.0 0.0 - 0.2 %    Comment: Performed at [facility], [address], [place], [place] [address]  Prepare RBC (crossmatch)     Status: None   Collection Time: 05/01/23  4:37 PM  Result Value Ref Range   Order Confirmation      ORDER PROCESSED BY BLOOD BANK Performed at [facility], [address] [place], [place] [address]   POC occult blood, ED     Status: None   Collection Time: 05/01/23  4:44 PM  Result Value Ref Range   Fecal Occult Bld NEGATIVE NEGATIVE   *Note: Due to a large number of results and/or encounters for the requested time period, some results have not been displayed. A complete set of results can be found in Results Review.   DG Chest Portable 1 View  Result Date: 05/01/2023 CLINICAL DATA:  Chest pain. EXAM: PORTABLE CHEST 1 VIEW COMPARISON:  April 23, 2023. FINDINGS: Stable cardiomegaly. Status post coronary artery bypass graft. Left internal jugular dialysis catheter is again noted. Small left pleural effusion is noted with adjacent atelectasis or infiltrate. Mild right basilar atelectasis or infiltrate is noted. IMPRESSION: Small left pleural effusion with adjacent atelectasis or infiltrate. Mild right basilar atelectasis or infiltrate is noted. Electronically Signed   By: Lupita Raider M.D.   On: 05/01/2023 17:09    Pending Labs Unresulted Labs (From admission, onward)     Start     Ordered   05/02/23 0500  Basic metabolic panel  Tomorrow morning,   R        05/01/23 1657   05/02/23 0500  CBC  Tomorrow morning,   R        05/01/23 1657            Vitals/Pain Today's Vitals   05/01/23 1351 05/01/23 1352 05/01/23 1520  BP:  (!) 125/47 (!) 145/66  Pulse:  74 74  Resp:  20 (!) 24  Temp:  98.7 F (37.1 C) 98.2 F (36.8 C)  TempSrc:  Oral Oral  SpO2:  92% 92%  Weight: 54.4 kg    PainSc: 0-No pain  10-Worst pain ever    Isolation Precautions No active isolations  Medications Medications   0.9 %  sodium chloride infusion (Manually program via Guardrails IV Fluids) (has no administration in time range)  ondansetron (ZOFRAN) tablet 4 mg (has no administration in time range)    Or  ondansetron (ZOFRAN) injection 4 mg (has no administration in time range)  sorbitol 70 % solution 30 mL (has no administration in time range)  melatonin tablet 3 mg (has no administration in time range)    Mobility walks with device       R Recommendations: See Admitting Provider Note  Report given to:

## 2023-05-02 DIAGNOSIS — D62 Acute posthemorrhagic anemia: Secondary | ICD-10-CM | POA: Diagnosis not present

## 2023-05-02 LAB — BASIC METABOLIC PANEL
Anion gap: 10 (ref 5–15)
BUN: 20 mg/dL (ref 8–23)
CO2: 26 mmol/L (ref 22–32)
Calcium: 8 mg/dL — ABNORMAL LOW (ref 8.9–10.3)
Chloride: 102 mmol/L (ref 98–111)
Creatinine, Ser: 5.88 mg/dL — ABNORMAL HIGH (ref 0.44–1.00)
GFR, Estimated: 7 mL/min — ABNORMAL LOW (ref >60)
Glucose, Bld: 84 mg/dL (ref 70–99)
Potassium: 3.4 mmol/L — ABNORMAL LOW (ref 3.5–5.1)
Sodium: 138 mmol/L (ref 135–145)

## 2023-05-02 LAB — CBC
HCT: 25.5 % — ABNORMAL LOW (ref 36.0–46.0)
Hemoglobin: 7.8 g/dL — ABNORMAL LOW (ref 12.0–15.0)
MCH: 32.2 pg (ref 26.0–34.0)
MCHC: 30.6 g/dL (ref 30.0–36.0)
MCV: 105.4 fL — ABNORMAL HIGH (ref 80.0–100.0)
Platelets: 188 10*3/uL (ref 150–400)
RBC: 2.42 MIL/uL — ABNORMAL LOW (ref 3.87–5.11)
WBC: 6 10*3/uL (ref 4.0–10.5)
nRBC: 0 % (ref 0.0–0.2)

## 2023-05-02 LAB — HEPATITIS B SURFACE ANTIGEN: Hepatitis B Surface Ag: NONREACTIVE

## 2023-05-02 LAB — TROPONIN I (HIGH SENSITIVITY): Troponin I (High Sensitivity): 195 ng/L (ref <18)

## 2023-05-02 LAB — GLUCOSE, CAPILLARY: Glucose-Capillary: 69 mg/dL — ABNORMAL LOW (ref 70–99)

## 2023-05-02 LAB — MRSA NEXT GEN BY PCR, NASAL: MRSA by PCR Next Gen: NOT DETECTED

## 2023-05-02 MED ORDER — CALCITRIOL 0.25 MCG PO CAPS: 1.7500 ug | ORAL_CAPSULE | ORAL | Status: DC

## 2023-05-02 MED ORDER — CHLORHEXIDINE GLUCONATE CLOTH 2 % EX PADS: 6.0000 | MEDICATED_PAD | Freq: Every day | CUTANEOUS | Status: DC

## 2023-05-02 MED ORDER — LEVETIRACETAM 500 MG PO TABS
500.0000 mg | ORAL_TABLET | Freq: Every day | ORAL | Status: DC
  Administered 2023-05-02: 500 mg via ORAL
  Filled 2023-05-02: qty 1

## 2023-05-02 MED ORDER — HYDROXYZINE HCL 25 MG PO TABS: 25.0000 mg | ORAL_TABLET | Freq: Four times a day (QID) | ORAL | Status: DC | PRN

## 2023-05-02 MED ORDER — ALPRAZOLAM 0.5 MG PO TABS: 0.5000 mg | ORAL_TABLET | Freq: Every day | ORAL | Status: DC | PRN

## 2023-05-02 MED ORDER — LIDOCAINE HCL (PF) 1 % IJ SOLN: 5.0000 mL | INTRAMUSCULAR | Status: DC | PRN

## 2023-05-02 MED ORDER — FOLIC ACID 1 MG PO TABS
1.0000 mg | ORAL_TABLET | Freq: Every day | ORAL | Status: DC
  Administered 2023-05-02: 1 mg via ORAL
  Filled 2023-05-02: qty 1

## 2023-05-02 MED ORDER — CINACALCET HCL 30 MG PO TABS: 30.0000 mg | ORAL_TABLET | ORAL | Status: DC

## 2023-05-02 MED ORDER — HEPARIN SODIUM (PORCINE) 1000 UNIT/ML DIALYSIS
1000.0000 [IU] | INTRAMUSCULAR | Status: DC | PRN
  Administered 2023-05-02: 3800 [IU]

## 2023-05-02 MED ORDER — ORAL CARE MOUTH RINSE: 15.0000 mL | OROMUCOSAL | Status: DC | PRN

## 2023-05-02 MED ORDER — LEVOTHYROXINE SODIUM 75 MCG PO TABS: 75.0000 ug | ORAL_TABLET | Freq: Every day | ORAL | Status: DC

## 2023-05-02 MED ORDER — METOPROLOL TARTRATE 25 MG PO TABS
12.5000 mg | ORAL_TABLET | Freq: Two times a day (BID) | ORAL | Status: DC
Start: 2023-05-02 — End: 2023-05-03
  Administered 2023-05-02 (×2): 12.5 mg via ORAL
  Filled 2023-05-02 (×2): qty 1

## 2023-05-02 MED ORDER — HEPARIN SODIUM (PORCINE) 1000 UNIT/ML IJ SOLN
INTRAMUSCULAR | Status: AC
  Filled 2023-05-02: qty 4

## 2023-05-02 MED ORDER — ALTEPLASE 2 MG IJ SOLR: 2.0000 mg | Freq: Once | INTRAMUSCULAR | Status: DC | PRN

## 2023-05-02 MED ORDER — RENA-VITE PO TABS
1.0000 | ORAL_TABLET | Freq: Every day | ORAL | Status: DC
  Administered 2023-05-02: 1 via ORAL
  Filled 2023-05-02: qty 1

## 2023-05-02 NOTE — Discharge Summary (Signed)
Physician Discharge Summary  Shawna [name] JKD:[id number] DOB: 1941/04/11 DOA: 05/01/2023  PCP: [facility]  Admit date: 05/01/2023  Discharge date: 05/02/2023  Admitted From:Home  Disposition:  Home  Recommendations for Outpatient Follow-up:  Follow up with PCP in 1-2 weeks Continue hemodialysis as routinely scheduled on 10/23 Continue home medications as prior.  Home Health: Home health RN and PT  Equipment/Devices: None  Discharge Condition:Stable  CODE STATUS: Full  Diet recommendation: Renal diet  Brief/Interim Summary:  Shawna [name] is a 82 y.o. female with medical history significant for recurrent GI bleeds possibly due to AVMs,  paroxysmal atrial fibrillation no longer on anticoagulation due to recurrent GI bleed, end-stage renal disease on hemodialysis Monday/ Wednesday/Fridays, seizure disorder, and coronary artery disease status post CABG and PCI who was sent to the ER from dialysis when they found that her hemoglobin was 5.9.  She presented with some symptomatic anemia in the setting of chronic blood loss and required PRBC transfusion today prior to and with hemodialysis.  She has received 2 units prior to discharge and has tolerated hemodialysis well.  She is otherwise in stable condition for discharge and may resume her usual schedule on 10/23.  No other acute events or concerns identified during this brief admission.  Discharge Diagnoses:  Principal Problem:   Acute blood loss anemia Active Problems:   Acute on chronic blood loss anemia   End-stage renal disease on hemodialysis (HCC)   Atrial fibrillation, chronic (HCC)   Arteriovenous malformation of intestine  Principal discharge diagnosis: Symptomatic chronic blood loss anemia requiring 2 unit PRBC transfusion.  Discharge Instructions  Discharge Instructions     Diet - low sodium heart healthy   Complete by: As directed    Increase activity slowly   Complete by: As directed    No wound  care   Complete by: As directed       Allergies as of 05/02/2023       Reactions   Amlodipine Swelling   Aspirin Other (See Comments)   High Doses Mess up her stomach; "makes my bowels have blood in them". Takes 81 mg EC Aspirin    Nitrofurantoin Hives   Ranexa [ranolazine] Other (See Comments)   Myoclonus-hospitalized    Bactrim [sulfamethoxazole-trimethoprim] Rash   Iodinated Contrast Media Itching, Other (See Comments)   Iron Itching, Other (See Comments)   "they gave me iron in dialysis; had to give me Benadryl cause I had to have the iron" (05/02/2012)   Tylenol [acetaminophen] Itching, Other (See Comments)   Makes her feet on fire per pt - can tolerate per pt   Gabapentin Other (See Comments)   Unknown reaction   Sucroferric Oxyhydroxide Other (See Comments)   Unknown   Levaquin [levofloxacin In D5w] Rash   Levofloxacin Rash, Other (See Comments)   Plavix [clopidogrel Bisulfate] Rash   Protonix [pantoprazole Sodium] Rash   Venofer [ferric Oxide] Itching, Other (See Comments)   Patient reports using Benadryl prior to doses as [facility]        Medication List     TAKE these medications    ALPRAZolam 0.5 MG tablet Commonly known as: XANAX Take 1 tablet (0.5 mg total) by mouth daily as needed for up to 4 doses for anxiety.   amiodarone 100 MG tablet Commonly known as: PACERONE Take 1 tablet (100 mg total) by mouth daily.   benzonatate 100 MG capsule Commonly known as: TESSALON Take 1 capsule (100 mg total) by mouth 3 (three)  times daily as needed for cough.   calcitRIOL 0.25 MCG capsule Commonly known as: ROCALTROL Take 7 capsules (1.75 mcg total) by mouth every Monday, Wednesday, and Friday with hemodialysis.   cinacalcet 30 MG tablet Commonly known as: SENSIPAR Take 1 tablet (30 mg total) by mouth every Monday, Wednesday, and Friday with hemodialysis.   cyanocobalamin 1000 MCG/ML injection Commonly known as: VITAMIN B12 Inject 1,000 mcg into the  muscle every 30 (thirty) days.   folic acid 1 MG tablet Commonly known as: FOLVITE Take 1 mg by mouth daily.   gabapentin 100 MG capsule Commonly known as: NEURONTIN Take 1 capsule (100 mg total) by mouth every evening.   guaiFENesin-codeine 100-10 MG/5ML syrup Take 5 mLs by mouth every 4 (four) hours as needed for cough.   hydrALAZINE 25 MG tablet Commonly known as: APRESOLINE Take 1 tablet (25 mg total) by mouth 2 (two) times daily. Hold for sbp <110   hydrOXYzine 25 MG tablet Commonly known as: ATARAX Take 25 mg by mouth every 6 (six) hours as needed for itching.   levETIRAcetam 500 MG tablet Commonly known as: KEPPRA Take 1 tablet (500 mg total) by mouth daily.   levothyroxine 75 MCG tablet Commonly known as: SYNTHROID Take 1 tablet (75 mcg total) by mouth daily at 6 (six) AM.   metoprolol tartrate 25 MG tablet Commonly known as: LOPRESSOR Take 0.5 tablets (12.5 mg total) by mouth 2 (two) times daily.   multivitamin Tabs tablet Take 1 tablet by mouth daily.   nitroGLYCERIN 0.4 MG SL tablet Commonly known as: NITROSTAT Place 0.4 mg under the tongue every 5 (five) minutes as needed for chest pain.   olmesartan 20 MG tablet Commonly known as: BENICAR Take 20 mg by mouth daily.   omeprazole 20 MG capsule Commonly known as: PRILOSEC Take 1 capsule (20 mg total) by mouth 2 (two) times daily before a meal. What changed:  when to take this reasons to take this   ondansetron 4 MG tablet Commonly known as: ZOFRAN Take 4 mg by mouth every 8 (eight) hours as needed.   rosuvastatin 10 MG tablet Commonly known as: CRESTOR Take 1 tablet by mouth once daily   SandoSTATIN LAR [place] 20 MG injection Generic drug: octreotide Inject 20 mg into the muscle every 28 (twenty-eight) days.        Follow-up Information     [facility]. Schedule an appointment as soon as possible for a visit in 1 week(s).   Contact information: [address] [place]  [address] [phone]                Allergies  Allergen Reactions   Amlodipine Swelling   Aspirin Other (See Comments)    High Doses Mess up her stomach; "makes my bowels have blood in them". Takes 81 mg EC Aspirin    Nitrofurantoin Hives   Ranexa [Ranolazine] Other (See Comments)    Myoclonus-hospitalized    Bactrim [Sulfamethoxazole-Trimethoprim] Rash   Iodinated Contrast Media Itching and Other (See Comments)   Iron Itching and Other (See Comments)    "they gave me iron in dialysis; had to give me Benadryl cause I had to have the iron" (05/02/2012)   Tylenol [Acetaminophen] Itching and Other (See Comments)    Makes her feet on fire per pt - can tolerate per pt   Gabapentin Other (See Comments)    Unknown reaction   Sucroferric Oxyhydroxide Other (See Comments)    Unknown   Levaquin [Levofloxacin  In D5w] Rash   Levofloxacin Rash and Other (See Comments)   Plavix [Clopidogrel Bisulfate] Rash   Protonix [Pantoprazole Sodium] Rash   Venofer [Ferric Oxide] Itching and Other (See Comments)    Patient reports using Benadryl prior to doses as [facility]    Consultations: Nephrology   Procedures/Studies: DG Chest Portable 1 View  Result Date: 05/01/2023 CLINICAL DATA:  Chest pain. EXAM: PORTABLE CHEST 1 VIEW COMPARISON:  April 23, 2023. FINDINGS: Stable cardiomegaly. Status post coronary artery bypass graft. Left internal jugular dialysis catheter is again noted. Small left pleural effusion is noted with adjacent atelectasis or infiltrate. Mild right basilar atelectasis or infiltrate is noted. IMPRESSION: Small left pleural effusion with adjacent atelectasis or infiltrate. Mild right basilar atelectasis or infiltrate is noted. Electronically Signed   By: Lupita Raider M.D.   On: 05/01/2023 17:09   CT ABDOMEN PELVIS WO CONTRAST  Result Date: 04/24/2023 CLINICAL DATA:  Abdominal pain, acute, nonlocalized. Increased swelling to legs and arms. Difficulty walking due to  swelling. EXAM: CT ABDOMEN AND PELVIS WITHOUT CONTRAST TECHNIQUE: Multidetector CT imaging of the abdomen and pelvis was performed following the standard protocol without IV contrast. RADIATION DOSE REDUCTION: This exam was performed according to the departmental dose-optimization program which includes automated exposure control, adjustment of the mA and/or kV according to patient size and/or use of iterative reconstruction technique. COMPARISON:  11/02/2022. FINDINGS: Lower chest: The heart is enlarged and multi-vessel coronary artery calcifications are noted. The distal tip of a central venous catheter terminates in the right atrium. There are small bilateral pleural effusions with atelectasis bilaterally. Hepatobiliary: No focal abnormality in the liver. The gallbladder is without stones. No biliary ductal dilatation. Pancreas: Pancreatic atrophy is noted. No pancreatic ductal dilatation or surrounding inflammatory changes. Spleen: Normal in size without focal abnormality. Adrenals/Urinary Tract: The adrenal glands are within normal limits. Renal atrophy is present bilaterally. Renal cysts are noted bilaterally. Renal calculi are noted bilaterally without evidence of hydronephrosis. The urinary bladder is decompressed. Stomach/Bowel: Stomach is within normal limits. Appendix is not seen. There is mild diffuse colonic wall thickening with no focal inflammatory changes. No free air or pneumatosis. Scattered diverticula are present along the colon without evidence of diverticulitis. Vascular/Lymphatic: Aortic atherosclerosis and extensive vascular calcifications. No abdominal or pelvic lymphadenopathy. Reproductive: Status post hysterectomy. No adnexal masses. Other: A trace amount ascites is noted. A fat containing umbilical hernia is present. Anasarca is noted. Musculoskeletal: No acute osseous abnormality. IMPRESSION: 1. Small bilateral pleural effusions with atelectasis at the lung bases. 2. Mild diffuse  colonic wall thickening, possible colitis. 3. Diverticulosis without diverticulitis. 4. Trace ascites. 5. Anasarca. 6. Renal atrophy with cysts and renal calculi. 7. Aortic atherosclerosis and coronary artery calcifications. Electronically Signed   By: Thornell Sartorius M.D.   On: 04/24/2023 00:52   DG Chest Port 1 View  Result Date: 04/23/2023 CLINICAL DATA:  Skipped dialysis with increased swelling to the legs and arms. EXAM: PORTABLE CHEST 1 VIEW COMPARISON:  April 10, 2023 FINDINGS: There is stable left-sided venous catheter positioning. Multiple sternal wires and vascular clips are noted. The cardiac silhouette is mildly enlarged and unchanged in size. A coronary artery stent is in place. A radiopaque vascular stent is also seen along the proximal left upper extremity. There is marked severity calcification of the thoracic aorta. Mild diffusely increased interstitial lung markings are present with mild atelectatic changes seen within the bilateral lung bases. Small bilateral pleural effusions are noted. No pneumothorax is identified.  Multilevel degenerative changes are seen throughout the thoracic spine. IMPRESSION: 1. Evidence of prior median sternotomy/CABG. 2. Mild interstitial edema with small bilateral pleural effusions and mild bibasilar atelectasis. Electronically Signed   By: Aram Candela M.D.   On: 04/23/2023 23:15   CT Head Wo Contrast  Result Date: 04/10/2023 CLINICAL DATA:  Facial trauma EXAM: CT HEAD WITHOUT CONTRAST CT MAXILLOFACIAL WITHOUT CONTRAST TECHNIQUE: Multidetector CT imaging of the head and maxillofacial structures were performed using the standard protocol without intravenous contrast. Multiplanar CT image reconstructions of the maxillofacial structures were also generated. RADIATION DOSE REDUCTION: This exam was performed according to the departmental dose-optimization program which includes automated exposure control, adjustment of the mA and/or kV according to patient  size and/or use of iterative reconstruction technique. COMPARISON:  CT brain 01/31/2023 FINDINGS: CT HEAD FINDINGS Brain: No acute territorial infarction, hemorrhage, or intracranial mass. Atrophy and chronic small vessel ischemic changes of the white matter. Small chronic basal ganglial infarcts. Vascular: No hyperdense vessels. Vertebral and carotid vascular calcification Skull: Normal. Negative for fracture or focal lesion. Other: None CT MAXILLOFACIAL FINDINGS Osseous: Mastoid air cells are clear. Mandibular heads are normally position. No mandibular fracture. Pterygoid plates and zygomatic arches are intact. No acute nasal bone fracture. Prominent right maxillary central incisor root lucency. Additional lucency and dental caries right mandibular molar. Orbits: Negative. No traumatic or inflammatory finding. Sinuses: Clear. Soft tissues: Left periorbital and premalar soft tissue swelling. IMPRESSION: 1. No CT evidence for acute intracranial abnormality. Atrophy and chronic small vessel ischemic changes of the white matter. 2. No acute facial bone fracture. Left periorbital and premalar soft tissue swelling. Electronically Signed   By: Jasmine Pang M.D.   On: 04/10/2023 18:54   CT Maxillofacial Wo Contrast  Result Date: 04/10/2023 CLINICAL DATA:  Facial trauma EXAM: CT HEAD WITHOUT CONTRAST CT MAXILLOFACIAL WITHOUT CONTRAST TECHNIQUE: Multidetector CT imaging of the head and maxillofacial structures were performed using the standard protocol without intravenous contrast. Multiplanar CT image reconstructions of the maxillofacial structures were also generated. RADIATION DOSE REDUCTION: This exam was performed according to the departmental dose-optimization program which includes automated exposure control, adjustment of the mA and/or kV according to patient size and/or use of iterative reconstruction technique. COMPARISON:  CT brain 01/31/2023 FINDINGS: CT HEAD FINDINGS Brain: No acute territorial infarction,  hemorrhage, or intracranial mass. Atrophy and chronic small vessel ischemic changes of the white matter. Small chronic basal ganglial infarcts. Vascular: No hyperdense vessels. Vertebral and carotid vascular calcification Skull: Normal. Negative for fracture or focal lesion. Other: None CT MAXILLOFACIAL FINDINGS Osseous: Mastoid air cells are clear. Mandibular heads are normally position. No mandibular fracture. Pterygoid plates and zygomatic arches are intact. No acute nasal bone fracture. Prominent right maxillary central incisor root lucency. Additional lucency and dental caries right mandibular molar. Orbits: Negative. No traumatic or inflammatory finding. Sinuses: Clear. Soft tissues: Left periorbital and premalar soft tissue swelling. IMPRESSION: 1. No CT evidence for acute intracranial abnormality. Atrophy and chronic small vessel ischemic changes of the white matter. 2. No acute facial bone fracture. Left periorbital and premalar soft tissue swelling. Electronically Signed   By: Jasmine Pang M.D.   On: 04/10/2023 18:54   DG Chest 2 View  Result Date: 04/10/2023 CLINICAL DATA:  Weakness EXAM: CHEST - 2 VIEW COMPARISON:  None Available. FINDINGS: Prior CABG. Left dialysis catheter tip in the right atrium. Mild cardiomegaly. Layering bilateral effusions with bibasilar atelectasis. No overt edema. No acute bony abnormality. IMPRESSION: Layering small bilateral pleural effusions  with bibasilar atelectasis. Electronically Signed   By: Charlett Nose M.D.   On: 04/10/2023 18:31   CT HEAD WO CONTRAST  Result Date: 04/07/2023 CLINICAL DATA:  Provided history: Head trauma, minor. Neck trauma. Additional history provided: Fall (hitting back of head). EXAM: CT HEAD WITHOUT CONTRAST CT CERVICAL SPINE WITHOUT CONTRAST TECHNIQUE: Multidetector CT imaging of the head and cervical spine was performed following the standard protocol without intravenous contrast. Multiplanar CT image reconstructions of the cervical  spine were also generated. RADIATION DOSE REDUCTION: This exam was performed according to the departmental dose-optimization program which includes automated exposure control, adjustment of the mA and/or kV according to patient size and/or use of iterative reconstruction technique. COMPARISON:  Head CT 02/10/2023.  Cervical spine CT 07/29/2022. FINDINGS: CT HEAD FINDINGS Brain: Generalized cerebral atrophy. Chronic lacunar infarct within the left lentiform nucleus/retrolenticular white matter. Background patchy and ill-defined hypoattenuation within the cerebral white matter, nonspecific but compatible with moderate to advanced chronic small vessel ischemic disease. Partially empty sella turcica. There is no acute intracranial hemorrhage. No demarcated cortical infarct. No extra-axial fluid collection. No evidence of an intracranial mass. No midline shift. Vascular: No hyperdense vessel.  Atherosclerotic calcifications. Skull: No calvarial fracture or aggressive osseous lesion. Sinuses/Orbits: No mass or acute finding within the imaged orbits. Minimal mucosal thickening within the left maxillary sinus at the imaged levels. Mild mucosal thickening within the left sphenoid sinus. CT CERVICAL SPINE FINDINGS Alignment: Dextrocurvature of the cervical spine. Slight grade 1 anterolisthesis at C6-C7, C7-T1 and T1-T2. Skull base and vertebrae: The basion-dental and atlanto-dental intervals are maintained.No evidence of acute fracture to the cervical spine. 5 mm erosion within the dorsal aspect of the dens to the right, new from the prior cervical spine CT of 07/29/2022. Redemonstrated small cyst within the right aspect of the C2 vertebral body (series 4, image 22). Facet ankylosis on the left at C6-C7. Soft tissues and spinal canal: No prevertebral fluid or swelling. No visible canal hematoma. Disc levels: Cervical spondylosis with multilevel disc space narrowing, disc bulges/central disc protrusions and facet arthrosis. No  appreciable high-grade spinal canal stenosis. No significant bony neural foraminal narrowing. Upper chest: Partially imaged right pleural effusion. Partially imaged left-sided central venous catheter. IMPRESSION: CT head: 1.  No evidence of an acute intracranial abnormality. 2. Parenchymal atrophy and chronic small vessel ischemic disease, as described. This includes a chronic lacunar infarct within the left lentiform nucleus/retrolenticular white matter. 3. Mild paranasal sinus mucosal thickening at the imaged levels. CT cervical spine: 1. No evidence of an acute cervical spine fracture. 2. Slight grade 1 anterolisthesis at C6-C7, C7-T1 and T1-T2. 3. Mild dextrocurvature of the cervical spine. 4. 5 mm erosion within the dorsal aspect of the dens, new from the prior cervical spine CT of 07/29/2022. Although nonspecific, this could reflect sequela of an inflammatory arthropathy. 5. Facet ankylosis on the left at C6-C7. 6. Partially imaged right pleural effusion. Electronically Signed   By: Jackey Loge D.O.   On: 04/07/2023 16:48   CT CERVICAL SPINE WO CONTRAST  Result Date: 04/07/2023 CLINICAL DATA:  Provided history: Head trauma, minor. Neck trauma. Additional history provided: Fall (hitting back of head). EXAM: CT HEAD WITHOUT CONTRAST CT CERVICAL SPINE WITHOUT CONTRAST TECHNIQUE: Multidetector CT imaging of the head and cervical spine was performed following the standard protocol without intravenous contrast. Multiplanar CT image reconstructions of the cervical spine were also generated. RADIATION DOSE REDUCTION: This exam was performed according to the departmental dose-optimization program which includes automated exposure  control, adjustment of the mA and/or kV according to patient size and/or use of iterative reconstruction technique. COMPARISON:  Head CT 02/10/2023.  Cervical spine CT 07/29/2022. FINDINGS: CT HEAD FINDINGS Brain: Generalized cerebral atrophy. Chronic lacunar infarct within the left  lentiform nucleus/retrolenticular white matter. Background patchy and ill-defined hypoattenuation within the cerebral white matter, nonspecific but compatible with moderate to advanced chronic small vessel ischemic disease. Partially empty sella turcica. There is no acute intracranial hemorrhage. No demarcated cortical infarct. No extra-axial fluid collection. No evidence of an intracranial mass. No midline shift. Vascular: No hyperdense vessel.  Atherosclerotic calcifications. Skull: No calvarial fracture or aggressive osseous lesion. Sinuses/Orbits: No mass or acute finding within the imaged orbits. Minimal mucosal thickening within the left maxillary sinus at the imaged levels. Mild mucosal thickening within the left sphenoid sinus. CT CERVICAL SPINE FINDINGS Alignment: Dextrocurvature of the cervical spine. Slight grade 1 anterolisthesis at C6-C7, C7-T1 and T1-T2. Skull base and vertebrae: The basion-dental and atlanto-dental intervals are maintained.No evidence of acute fracture to the cervical spine. 5 mm erosion within the dorsal aspect of the dens to the right, new from the prior cervical spine CT of 07/29/2022. Redemonstrated small cyst within the right aspect of the C2 vertebral body (series 4, image 22). Facet ankylosis on the left at C6-C7. Soft tissues and spinal canal: No prevertebral fluid or swelling. No visible canal hematoma. Disc levels: Cervical spondylosis with multilevel disc space narrowing, disc bulges/central disc protrusions and facet arthrosis. No appreciable high-grade spinal canal stenosis. No significant bony neural foraminal narrowing. Upper chest: Partially imaged right pleural effusion. Partially imaged left-sided central venous catheter. IMPRESSION: CT head: 1.  No evidence of an acute intracranial abnormality. 2. Parenchymal atrophy and chronic small vessel ischemic disease, as described. This includes a chronic lacunar infarct within the left lentiform nucleus/retrolenticular  white matter. 3. Mild paranasal sinus mucosal thickening at the imaged levels. CT cervical spine: 1. No evidence of an acute cervical spine fracture. 2. Slight grade 1 anterolisthesis at C6-C7, C7-T1 and T1-T2. 3. Mild dextrocurvature of the cervical spine. 4. 5 mm erosion within the dorsal aspect of the dens, new from the prior cervical spine CT of 07/29/2022. Although nonspecific, this could reflect sequela of an inflammatory arthropathy. 5. Facet ankylosis on the left at C6-C7. 6. Partially imaged right pleural effusion. Electronically Signed   By: Jackey Loge D.O.   On: 04/07/2023 16:48     Discharge Exam: Vitals:   05/02/23 1200 05/02/23 1300  BP: (!) 133/49 (!) 140/53  Pulse: 68 (!) 47  Resp: 16 19  Temp:    SpO2: 99% 100%   Vitals:   05/02/23 1100 05/02/23 1139 05/02/23 1200 05/02/23 1300  BP: (!) 112/93  (!) 133/49 (!) 140/53  Pulse: 62  68 (!) 47  Resp: 19  16 19   Temp:  (!) 97.4 F (36.3 C)    TempSrc:  Axillary    SpO2: 100%  99% 100%  Weight:      Height:        General: Pt is alert, awake, not in acute distress Cardiovascular: RRR, S1/S2 +, no rubs, no gallops Respiratory: CTA bilaterally, no wheezing, no rhonchi Abdominal: Soft, NT, ND, bowel sounds + Extremities: no edema, no cyanosis    The results of significant diagnostics from this hospitalization (including imaging, microbiology, ancillary and laboratory) are listed below for reference.     Microbiology: Recent Results (from the past 240 hour(s))  SARS Coronavirus 2 by RT PCR (hospital order, performed  in [facility] hospital lab) *cepheid single result test* Anterior Nasal Swab     Status: None   Collection Time: 04/23/23  9:28 PM   Specimen: Anterior Nasal Swab  Result Value Ref Range Status   SARS Coronavirus 2 by RT PCR NEGATIVE NEGATIVE Final    Comment: (NOTE) SARS-CoV-2 target nucleic acids are NOT DETECTED.  The SARS-CoV-2 RNA is generally detectable in upper and lower respiratory specimens  during the acute phase of infection. The lowest concentration of SARS-CoV-2 viral copies this assay can detect is 250 copies / mL. A negative result does not preclude SARS-CoV-2 infection and should not be used as the sole basis for treatment or other patient management decisions.  A negative result may occur with improper specimen collection / handling, submission of specimen other than nasopharyngeal swab, presence of viral mutation(s) within the areas targeted by this assay, and inadequate number of viral copies (<250 copies / mL). A negative result must be combined with clinical observations, patient history, and epidemiological information.  Fact Sheet for Patients:   RoadLapTop.co.za  Fact Sheet for Healthcare Providers: http://kim-miller.com/  This test is not yet approved or  cleared by the [place] FDA and has been authorized for detection and/or diagnosis of SARS-CoV-2 by FDA under an Emergency Use Authorization (EUA).  This EUA will remain in effect (meaning this test can be used) for the duration of the COVID-19 declaration under Section 564(b)(1) of the Act, 21 U.S.C. section 360bbb-3(b)(1), unless the authorization is terminated or revoked sooner.  Performed at [facility], [address] [place], [place] [address]   MRSA Next Gen by PCR, Nasal     Status: None   Collection Time: 05/01/23  8:30 PM   Specimen: Nasal Mucosa; Nasal Swab  Result Value Ref Range Status   MRSA by PCR Next Gen NOT DETECTED NOT DETECTED Final    Comment: (NOTE) The GeneXpert MRSA Assay (FDA approved for NASAL specimens only), is one component of a comprehensive MRSA colonization surveillance program. It is not intended to diagnose MRSA infection nor to guide or monitor treatment for MRSA infections. Test performance is not FDA approved in patients less than 24 years old. Performed at [facility], [address], [place], [place] [address]       Labs: BNP (last 3 results) Recent Labs    08/06/22 0139 08/26/22 1410 10/23/22 1420  BNP 1,666.2* 1,590.0* 2,205.0*   Basic Metabolic Panel: Recent Labs  Lab 05/01/23 1456 05/02/23 0453  NA 138 138  K 3.2* 3.4*  CL 102 102  CO2 28 26  GLUCOSE 83 84  BUN 17 20  CREATININE 5.42* 5.88*  CALCIUM 8.2* 8.0*   Liver Function Tests: Recent Labs  Lab 05/01/23 1456  AST 25  ALT 14  ALKPHOS 57  BILITOT 0.7  PROT 5.2*  ALBUMIN 1.9*   No results for input(s): "LIPASE", "AMYLASE" in the last 168 hours. No results for input(s): "AMMONIA" in the last 168 hours. CBC: Recent Labs  Lab 05/01/23 1456 05/02/23 0453  WBC 8.5 6.0  HGB 5.9* 7.8*  HCT 18.2* 25.5*  MCV 113.8* 105.4*  PLT 218 188   Cardiac Enzymes: No results for input(s): "CKTOTAL", "CKMB", "CKMBINDEX", "TROPONINI" in the last 168 hours. BNP: Invalid input(s): "POCBNP" CBG: Recent Labs  Lab 05/02/23 0800  GLUCAP 69*   D-Dimer No results for input(s): "DDIMER" in the last 72 hours. Hgb A1c No results for input(s): "HGBA1C" in the last 72 hours. Lipid Profile No results for  input(s): "CHOL", "HDL", "LDLCALC", "TRIG", "CHOLHDL", "LDLDIRECT" in the last 72 hours. Thyroid function studies No results for input(s): "TSH", "T4TOTAL", "T3FREE", "THYROIDAB" in the last 72 hours.  Invalid input(s): "FREET3" Anemia work up No results for input(s): "VITAMINB12", "FOLATE", "FERRITIN", "TIBC", "IRON", "RETICCTPCT" in the last 72 hours. Urinalysis    Component Value Date/Time   COLORURINE YELLOW 12/16/2012 1919   APPEARANCEUR CLOUDY (A) 12/16/2012 1919   LABSPEC 1.009 12/16/2012 1919   PHURINE 7.5 12/16/2012 1919   GLUCOSEU NEGATIVE 12/16/2012 1919   HGBUR TRACE (A) 12/16/2012 1919   BILIRUBINUR NEGATIVE 12/16/2012 1919   KETONESUR NEGATIVE 12/16/2012 1919   PROTEINUR 100 (A) 12/16/2012 1919   UROBILINOGEN 0.2 12/16/2012 1919   NITRITE NEGATIVE 12/16/2012 1919   LEUKOCYTESUR SMALL (A) 12/16/2012 1919    Sepsis Labs Recent Labs  Lab 05/01/23 1456 05/02/23 0453  WBC 8.5 6.0   Microbiology Recent Results (from the past 240 hour(s))  SARS Coronavirus 2 by RT PCR (hospital order, performed in [facility] hospital lab) *cepheid single result test* Anterior Nasal Swab     Status: None   Collection Time: 04/23/23  9:28 PM   Specimen: Anterior Nasal Swab  Result Value Ref Range Status   SARS Coronavirus 2 by RT PCR NEGATIVE NEGATIVE Final    Comment: (NOTE) SARS-CoV-2 target nucleic acids are NOT DETECTED.  The SARS-CoV-2 RNA is generally detectable in upper and lower respiratory specimens during the acute phase of infection. The lowest concentration of SARS-CoV-2 viral copies this assay can detect is 250 copies / mL. A negative result does not preclude SARS-CoV-2 infection and should not be used as the sole basis for treatment or other patient management decisions.  A negative result may occur with improper specimen collection / handling, submission of specimen other than nasopharyngeal swab, presence of viral mutation(s) within the areas targeted by this assay, and inadequate number of viral copies (<250 copies / mL). A negative result must be combined with clinical observations, patient history, and epidemiological information.  Fact Sheet for Patients:   RoadLapTop.co.za  Fact Sheet for Healthcare Providers: http://kim-miller.com/  This test is not yet approved or  cleared by the [place] FDA and has been authorized for detection and/or diagnosis of SARS-CoV-2 by FDA under an Emergency Use Authorization (EUA).  This EUA will remain in effect (meaning this test can be used) for the duration of the COVID-19 declaration under Section 564(b)(1) of the Act, 21 U.S.C. section 360bbb-3(b)(1), unless the authorization is terminated or revoked sooner.  Performed at [facility], [address] [place], [place] [address]   MRSA Next  Gen by PCR, Nasal     Status: None   Collection Time: 05/01/23  8:30 PM   Specimen: Nasal Mucosa; Nasal Swab  Result Value Ref Range Status   MRSA by PCR Next Gen NOT DETECTED NOT DETECTED Final    Comment: (NOTE) The GeneXpert MRSA Assay (FDA approved for NASAL specimens only), is one component of a comprehensive MRSA colonization surveillance program. It is not intended to diagnose MRSA infection nor to guide or monitor treatment for MRSA infections. Test performance is not FDA approved in patients less than 109 years old. Performed at [facility], [address], [place], [place] [address]      Time coordinating discharge: 35 minutes  SIGNED:   Erick Blinks, DO [facility] 05/02/2023, 2:56 PM  If 7PM-7AM, please contact night-coverage [email or web address]

## 2023-05-02 NOTE — Consult Note (Signed)
[facility] Renal Consultation Note    Indication for Consultation:  Management of ESRD/hemodialysis; anemia, hypertension/volume and secondary hyperparathyroidism  HPI: Shawna [name] is a 82 y.o. female with past medical history significant for hypertension, HLD, CAD status post stent, CABG, A-fib, recurrent GI bleed (AVM's), ESRD on HD MWF who presented to [facility] ED on 05/01/23 after c/o weakness and found to have an Hgb of 5 by her outpatient HD unit and was sent for further evaluation and blood  transfusions.  She was also complaining of SOB and intermittent CP prior to admission.  In the ED, temp 98.7, Bp 125/47, HR 74, RR 20, SpO2 92% on room air.  Labs notable for K 3.2, alb 1.9, Hgb 5.9.  CXR with small left pleural effusion.  She was admitted for blood transfusions and we were consulted to provide dialysis during her hospitalization.     Past Medical History:  Diagnosis Date   Acute on chronic respiratory failure with hypoxia (HCC) 10/10/2016   Anxiety    Arthritis    AVM (arteriovenous malformation) of colon    CAD (coronary artery disease)    a. s/p CABG in 2013 b. DES to D1 in 10/2016. c. cath in 07/2018 showing patent grafts with occlusion of D1 at prior stent site and progression of PDA disease --> medical management recommended   Carotid artery disease (HCC)    a. 60-79% LICA, 03/2012    Chronic bronchitis (HCC)    Chronic HFrEF (heart failure with reduced ejection fraction) (HCC)    Colon cancer (HCC) 1992   Esophageal stricture    ESRD on hemodialysis (HCC)    ESRD due to HTN, started dialysis 2011 and gets HD at [facility] with Dr Fausto Skillern on MWF schedule.  Access is LUA AVF as of Sept 2014.    GERD (gastroesophageal reflux disease)    High cholesterol 12/2011   History of blood transfusion 07/2011; 12/2011; 01/2012 X 2; 04/2012   History of gout    History of lower GI bleeding    Hypertension    Iron deficiency anemia    Jugular vein occlusion, right (HCC)     Mitral regurgitation    a. Moderate by echo, 02/2012   Mitral valve disease    NSVT (nonsustained ventricular tachycardia) (HCC)    Ovarian cancer (HCC) 1992   PAF (paroxysmal atrial fibrillation) (HCC)    Pneumonia ~ 2009   PUD (peptic ulcer disease)    TIA (transient ischemic attack)    Tricuspid valve disease    Past Surgical History:  Procedure Laterality Date   A/V FISTULAGRAM Left 10/04/2022   Procedure: A/V Fistulagram;  Surgeon: Renford Dills, MD;  Location: [facility] INVASIVE CV LAB;  Service: Cardiovascular;  Laterality: Left;   A/V SHUNTOGRAM Left 03/19/2019   Procedure: A/V SHUNTOGRAM;  Surgeon: Renford Dills, MD;  Location: [facility] INVASIVE CV LAB;  Service: Cardiovascular;  Laterality: Left;   ABDOMINAL HYSTERECTOMY  1992   APPENDECTOMY  06/1990   AV FISTULA PLACEMENT  07/2009   left upper arm   AV FISTULA PLACEMENT Right 09/06/2016   Procedure: RIGHT FOREARM ARTERIOVENOUS (AV) GRAFT;  Surgeon: Sherren Kerns, MD;  [facility]: [facility] OR;  Service: Vascular;  Laterality: Right;   AV FISTULA PLACEMENT N/A 02/24/2017   Procedure: INSERTION OF ARTERIOVENOUS (AV) GORE-TEX GRAFT ARM (BRACHIAL AXILLARY);  Surgeon: Renford Dills, MD;  Location: [facility] ORS;  Service: Vascular;  Laterality: N/A;   AVGG REMOVAL Right 09/06/2016   Procedure: REMOVAL  OF Right Arm ARTERIOVENOUS GORETEX GRAFT and Vein Patch angioplasty of brachial artery;  Surgeon: Chuck Hint, MD;  Location: [facility] OR;  Service: Vascular;  Laterality: Right;   BIOPSY  09/26/2019   Procedure: BIOPSY;  Surgeon: Malissa Hippo, MD;  [facility] ENDO [facility];  Service: Endoscopy;;   BIOPSY  11/01/2022   Procedure: BIOPSY;  Surgeon: Dolores Frame, MD;  [facility];  Service: Gastroenterology;;   COLON RESECTION  1992   COLON SURGERY     COLONOSCOPY N/A 03/09/2019   Procedure: COLONOSCOPY;  Surgeon: Malissa Hippo, MD;  [facility];  Service: Endoscopy;  Laterality: N/A;   COLONOSCOPY  N/A 08/11/2022   Procedure: COLONOSCOPY;  Surgeon: Beverley Fiedler, MD;  Location: [facility] ENDOSCOPY;  Service: Gastroenterology;  Laterality: N/A;   COLONOSCOPY WITH PROPOFOL N/A 06/08/2021   Procedure: COLONOSCOPY WITH PROPOFOL;  Surgeon: Dolores Frame, MD;  [facility];  Service: Gastroenterology;  Laterality: N/A;  9:05 /Patient is on dialysis Mon Wed Fri   CORONARY ANGIOPLASTY WITH STENT PLACEMENT  12/15/11   "2"   CORONARY ANGIOPLASTY WITH STENT PLACEMENT  y/2013   "1; makes total of 3" (05/02/2012)   CORONARY ARTERY BYPASS GRAFT  06/13/2012   Procedure: CORONARY ARTERY BYPASS GRAFTING (CABG);  Surgeon: Delight Ovens, MD;  Location: [facility] OR;  Service: Open Heart Surgery;  Laterality: N/A;  cabg x four;  using left internal mammary artery, and left leg greater saphenous vein harvested endoscopically   CORONARY STENT INTERVENTION N/A 10/13/2016   Procedure: Coronary Stent Intervention;  Surgeon: Lennette Bihari, MD;  [facility] INV[facility] CV LAB;  Service: Cardiovascular;  Laterality: N/A;   DIALYSIS/PERMA CATHETER REMOVAL N/A 04/18/2017   Procedure: DIALYSIS/PERMA CATHETER REMOVAL;  Surgeon: Renford Dills, MD;  Location: [facility] INVASIVE CV LAB;  Service: Cardiovascular;  Laterality: N/A;   DILATION AND CURETTAGE OF UTERUS     ENTEROSCOPY N/A 06/08/2021   Procedure: PUSH ENTEROSCOPY;  Surgeon: Dolores Frame, MD;  [facility];  Service: Gastroenterology;  Laterality: N/A;   ENTEROSCOPY N/A 11/01/2022   Procedure: ENTEROSCOPY;  Surgeon: Dolores Frame, MD;  [facility];  Service: Gastroenterology;  Laterality: N/A;   ENTEROSCOPY N/A 12/01/2022   Procedure: ENTEROSCOPY;  Surgeon: Benancio Deeds, MD;  Location: [facility] ENDOSCOPY;  Service: Gastroenterology;  Laterality: N/A;   ESOPHAGOGASTRODUODENOSCOPY  01/20/2012   Procedure: ESOPHAGOGASTRODUODENOSCOPY (EGD);  Surgeon: Meryl Dare, MD,FACG;  Location: [facility] ENDOSCOPY;  Service:  Endoscopy;  Laterality: N/A;   ESOPHAGOGASTRODUODENOSCOPY N/A 03/26/2013   Procedure: ESOPHAGOGASTRODUODENOSCOPY (EGD);  Surgeon: Hilarie Fredrickson, MD;  Location: [facility] ENDOSCOPY;  Service: Endoscopy;  Laterality: N/A;   ESOPHAGOGASTRODUODENOSCOPY N/A 04/30/2015   Procedure: ESOPHAGOGASTRODUODENOSCOPY (EGD);  Surgeon: Malissa Hippo, MD;  [facility];  Service: Endoscopy;  Laterality: N/A;  1pm - moved to 10/20 @ 1:10   ESOPHAGOGASTRODUODENOSCOPY N/A 07/29/2016   Procedure: ESOPHAGOGASTRODUODENOSCOPY (EGD);  Surgeon: Ruffin Frederick, MD;  Location: [facility] ENDOSCOPY;  Service: Gastroenterology;  Laterality: N/A;  enteroscopy   ESOPHAGOGASTRODUODENOSCOPY N/A 09/26/2019   Procedure: ESOPHAGOGASTRODUODENOSCOPY (EGD);  Surgeon: Malissa Hippo, MD;  [facility];  Service: Endoscopy;  Laterality: N/A;  1250   ESOPHAGOGASTRODUODENOSCOPY N/A 08/11/2022   Procedure: ESOPHAGOGASTRODUODENOSCOPY (EGD);  Surgeon: Beverley Fiedler, MD;  Location: [facility] ENDOSCOPY;  Service: Gastroenterology;  Laterality: N/A;   ESOPHAGOGASTRODUODENOSCOPY (EGD) WITH PROPOFOL N/A 02/05/2021   Procedure: ESOPHAGOGASTRODUODENOSCOPY (EGD) WITH PROPOFOL;  Surgeon: Lanelle Bal, DO;  [facility];  Service: Endoscopy;  Laterality: N/A;   ESOPHAGOGASTRODUODENOSCOPY (EGD) WITH PROPOFOL N/A 08/27/2022   Procedure: ESOPHAGOGASTRODUODENOSCOPY (EGD) WITH PROPOFOL;  Surgeon: Lanelle Bal, DO;  [facility];  Service: Endoscopy;  Laterality: N/A;   GIVENS CAPSULE STUDY N/A 03/07/2019   Procedure: GIVENS CAPSULE STUDY;  Surgeon: Malissa Hippo, MD;  [facility];  Service: Endoscopy;  Laterality: N/A;  7:30   GIVENS CAPSULE STUDY N/A 04/22/2021   Procedure: GIVENS CAPSULE STUDY;  Surgeon: Malissa Hippo, MD;  [facility];  Service: Endoscopy;  Laterality: N/A;  7:30   GIVENS CAPSULE STUDY N/A 08/27/2022   Procedure: GIVENS CAPSULE STUDY;  Surgeon: Lanelle Bal, DO;   [facility];  Service: Endoscopy;  Laterality: N/A;   HOT HEMOSTASIS N/A 08/11/2022   Procedure: HOT HEMOSTASIS (ARGON PLASMA COAGULATION/BICAP);  Surgeon: Beverley Fiedler, MD;  Location: [facility] ENDOSCOPY;  Service: Gastroenterology;  Laterality: N/A;   HOT HEMOSTASIS N/A 12/01/2022   Procedure: HOT HEMOSTASIS (ARGON PLASMA COAGULATION/BICAP);  Surgeon: Benancio Deeds, MD;  Location: [facility] ENDOSCOPY;  Service: Gastroenterology;  Laterality: N/A;   INSERTION OF DIALYSIS CATHETER N/A 10/05/2020   Procedure: ABORTED TUNNELED DIALYSIS CATHETER PLACEMENT RIGHT INTERNAL JUGULAR VEIN ;  Surgeon: Lucretia Roers, MD;  Location: [facility] ORS;  Service: General;  Laterality: N/A;   INTRAOPERATIVE TRANSESOPHAGEAL ECHOCARDIOGRAM  06/13/2012   Procedure: INTRAOPERATIVE TRANSESOPHAGEAL ECHOCARDIOGRAM;  Surgeon: Delight Ovens, MD;  Location: [facility] OR;  Service: Open Heart Surgery;  Laterality: N/A;   IR DIALY SHUNT INTRO NEEDLE/INTRACATH INITIAL W/IMG LEFT Left 10/06/2020   IR FLUORO GUIDE CV LINE RIGHT  06/17/2020   IR FLUORO GUIDE CV LINE RIGHT  11/12/2022   IR GENERIC HISTORICAL  07/26/2016   IR FLUORO GUIDE CV LINE RIGHT 07/26/2016 Berdine Dance, MD [facility]   IR GENERIC HISTORICAL  07/26/2016   IR US GUIDE VASC ACCESS RIGHT 07/26/2016 Berdine Dance, MD [facility]   IR GENERIC HISTORICAL  08/02/2016   IR US GUIDE VASC ACCESS RIGHT 08/02/2016 Berdine Dance, MD [facility]   IR GENERIC HISTORICAL  08/02/2016   IR FLUORO GUIDE CV LINE RIGHT 08/02/2016 Berdine Dance, MD [facility]   IR RADIOLOGY PERIPHERAL GUIDED IV START  03/28/2017   IR REMOVAL TUN CV CATH W/O FL  08/11/2020   IR REMOVAL TUN CV CATH W/O FL  11/15/2022   IR THROMBECTOMY AV FISTULA W/THROMBOLYSIS INC/SHUNT/IMG LEFT Left 06/17/2020   IR US GUIDE VASC ACCESS LEFT  06/17/2020   IR US GUIDE VASC ACCESS RIGHT  03/28/2017   IR US GUIDE VASC ACCESS RIGHT  06/17/2020   IR US GUIDE VASC ACCESS RIGHT  11/12/2022   LEFT HEART CATH AND CORONARY ANGIOGRAPHY  N/A 09/20/2016   Procedure: Left Heart Cath and Coronary Angiography;  Surgeon: Lyn Records, MD;  Location: [facility] INVASIVE CV LAB;  Service: Cardiovascular;  Laterality: N/A;   LEFT HEART CATH AND CORS/GRAFTS ANGIOGRAPHY N/A 10/13/2016   Procedure: Left Heart Cath and Cors/Grafts Angiography;  Surgeon: Lennette Bihari, MD;  [facility] INVAS[facility] CV LAB;  Service: Cardiovascular;  Laterality: N/A;   LEFT HEART CATH AND CORS/GRAFTS ANGIOGRAPHY N/A 07/13/2018   Procedure: LEFT HEART CATH AND CORS/GRAFTS ANGIOGRAPHY;  Surgeon: Swaziland, Peter M, MD;  Location: [facility] INVASIVE CV LAB;  Service: Cardiovascular;  Laterality: N/A;   LEFT HEART CATH AND CORS/GRAFTS ANGIOGRAPHY N/A 07/22/2021   Procedure: LEFT HEART CATH AND CORS/GRAFTS ANGIOGRAPHY;  Surgeon: Runell Gess,  MD;  [facility] INVASIVE CV LAB;  Service: Cardiovascular;  Laterality: N/A;   LEFT HEART CATHETERIZATION WITH CORONARY ANGIOGRAM N/A 12/15/2011   Procedure: LEFT HEART CATHETERIZATION WITH CORONARY ANGIOGRAM;  Surgeon: Kathleene Hazel, MD;  Location: [facility] CATH LAB;  Service: Cardiovascular;  Laterality: N/A;   LEFT HEART CATHETERIZATION WITH CORONARY ANGIOGRAM N/A 01/10/2012   Procedure: LEFT HEART CATHETERIZATION WITH CORONARY ANGIOGRAM;  Surgeon: Peter M Swaziland, MD;  Location: [facility] CATH LAB;  Service: Cardiovascular;  Laterality: N/A;   LEFT HEART CATHETERIZATION WITH CORONARY ANGIOGRAM N/A 06/08/2012   Procedure: LEFT HEART CATHETERIZATION WITH CORONARY ANGIOGRAM;  Surgeon: Kathleene Hazel, MD;  Location: [facility] CATH LAB;  Service: Cardiovascular;  Laterality: N/A;   LEFT HEART CATHETERIZATION WITH CORONARY/GRAFT ANGIOGRAM N/A 12/10/2013   Procedure: LEFT HEART CATHETERIZATION WITH Isabel Caprice;  Surgeon: Corky Crafts, MD;  Location: [facility] CATH LAB;  Service: Cardiovascular;  Laterality: N/A;   OVARY SURGERY     ovarian cancer   POLYPECTOMY  03/09/2019   Procedure: POLYPECTOMY;  Surgeon: Malissa Hippo, MD;  [facility]: [facility]  ENDO [facility];  Service: Endoscopy;;  cecal    POLYPECTOMY N/A 09/26/2019   Procedure: DUODENAL POLYPECTOMY;  Surgeon: Malissa Hippo, MD;  [facility];  Service: Endoscopy;  Laterality: N/A;   POLYPECTOMY  08/11/2022   Procedure: POLYPECTOMY;  Surgeon: Beverley Fiedler, MD;  Location: [facility] ENDOSCOPY;  Service: Gastroenterology;;   REVISION OF ARTERIOVENOUS GORETEX GRAFT N/A 02/24/2017   Procedure: REVISION OF ARTERIOVENOUS GORETEX GRAFT (RESECTION);  Surgeon: Renford Dills, MD;  Location: [facility] ORS;  Service: Vascular;  Laterality: N/A;   REVISON OF ARTERIOVENOUS FISTULA Left 06/19/2020   Procedure: REVISION OF LEFT UPPER ARM AV GRAFT WITH INTERPOSITION JUMP GRAFT USING GORE LIMB;  Surgeon: Cephus Shelling, MD;  Location: [facility] OR;  Service: Vascular;  Laterality: Left;   SHUNTOGRAM N/A 10/15/2013   Procedure: Fistulogram;  Surgeon: Nada Libman, MD;  Location: [facility] CATH LAB;  Service: Cardiovascular;  Laterality: N/A;   SUBMUCOSAL TATTOO INJECTION  11/01/2022   Procedure: SUBMUCOSAL TATTOO INJECTION;  Surgeon: Dolores Frame, MD;  [facility];  Service: Gastroenterology;;   THROMBECTOMY / ARTERIOVENOUS GRAFT REVISION  2011   left upper arm   TUBAL LIGATION  1980's   UPPER EXTREMITY ANGIOGRAPHY Bilateral 12/06/2016   Procedure: Upper Extremity Angiography;  Surgeon: Renford Dills, MD;  Location: [facility] INVASIVE CV LAB;  Service: Cardiovascular;  Laterality: Bilateral;   UPPER EXTREMITY INTERVENTION Left 06/06/2017   Procedure: UPPER EXTREMITY INTERVENTION;  Surgeon: Renford Dills, MD;  Location: [facility] INVASIVE CV LAB;  Service: Cardiovascular;  Laterality: Left;   UPPER EXTREMITY VENOGRAPHY Left 10/04/2022   Procedure: UPPER EXTREMITY VENOGRAPHY;  Surgeon: Renford Dills, MD;  Location: [facility] INVASIVE CV LAB;  Service: Cardiovascular;  Laterality: Left;   Family History:   Family History  Problem Relation Age of Onset   Heart disease Mother         Heart Disease before age 55   Hyperlipidemia Mother    Hypertension Mother    Diabetes Mother    Heart attack Mother    Heart disease Father        Heart Disease before age 43   Hyperlipidemia Father    Hypertension Father    Diabetes Father    Diabetes Sister    Hypertension Sister    Diabetes Brother    Hyperlipidemia Brother    Heart attack Brother    Hypertension Sister  Heart attack Brother    Colon cancer Child 78   Other Other        noncontributory for early CAD   Esophageal cancer Neg Hx    Liver disease Neg Hx    Kidney disease Neg Hx    Colon polyps Neg Hx    Social History:  reports that she has never smoked. She has never used smokeless tobacco. She reports that she does not drink alcohol and does not use drugs. Allergies  Allergen Reactions   Amlodipine Swelling   Aspirin Other (See Comments)    High Doses Mess up her stomach; "makes my bowels have blood in them". Takes 81 mg EC Aspirin    Nitrofurantoin Hives   Ranexa [Ranolazine] Other (See Comments)    Myoclonus-hospitalized    Bactrim [Sulfamethoxazole-Trimethoprim] Rash   Iodinated Contrast Media Itching and Other (See Comments)   Iron Itching and Other (See Comments)    "they gave me iron in dialysis; had to give me Benadryl cause I had to have the iron" (05/02/2012)   Tylenol [Acetaminophen] Itching and Other (See Comments)    Makes her feet on fire per pt - can tolerate per pt   Gabapentin Other (See Comments)    Unknown reaction   Sucroferric Oxyhydroxide Other (See Comments)    Unknown   Levaquin [Levofloxacin In D5w] Rash   Levofloxacin Rash and Other (See Comments)   Plavix [Clopidogrel Bisulfate] Rash   Protonix [Pantoprazole Sodium] Rash   Venofer [Ferric Oxide] Itching and Other (See Comments)    Patient reports using Benadryl prior to doses as [facility]   Prior to Admission medications   Medication Sig Start Date End Date Taking? Authorizing Provider  ALPRAZolam Prudy Feeler) 0.5  MG tablet Take 1 tablet (0.5 mg total) by mouth daily as needed for up to 4 doses for anxiety. 12/19/22   Lanae Boast, MD  amiodarone (PACERONE) 100 MG tablet Take 1 tablet (100 mg total) by mouth daily. 02/02/23 03/08/23  Sherryll Burger, Pratik D, DO  benzonatate (TESSALON) 100 MG capsule Take 1 capsule (100 mg total) by mouth 3 (three) times daily as needed for cough. 01/18/23   Tilden Fossa, MD  calcitRIOL (ROCALTROL) 0.25 MCG capsule Take 7 capsules (1.75 mcg total) by mouth every Monday, Wednesday, and Friday with hemodialysis. 12/21/22   Lanae Boast, MD  cinacalcet (SENSIPAR) 30 MG tablet Take 1 tablet (30 mg total) by mouth every Monday, Wednesday, and Friday with hemodialysis. 12/19/22   Lanae Boast, MD  cyanocobalamin (VITAMIN B12) 1000 MCG/ML injection Inject 1,000 mcg into the muscle every 30 (thirty) days.    [provider]  folic acid (FOLVITE) 1 MG tablet Take 1 mg by mouth daily. 05/24/22   [provider]  gabapentin (NEURONTIN) 100 MG capsule Take 1 capsule (100 mg total) by mouth every evening. 12/13/22   Jerald Kief, MD  guaiFENesin-codeine 100-10 MG/5ML syrup Take 5 mLs by mouth every 4 (four) hours as needed for cough. 10/23/22   Jacalyn Lefevre, MD  hydrALAZINE (APRESOLINE) 25 MG tablet Take 1 tablet (25 mg total) by mouth 2 (two) times daily. Hold for sbp <110 12/27/22   Lanae Boast, MD  hydrOXYzine (ATARAX) 25 MG tablet Take 25 mg by mouth every 6 (six) hours as needed for itching. 06/06/22   [provider]  levETIRAcetam (KEPPRA) 500 MG tablet Take 1 tablet (500 mg total) by mouth daily. 11/15/22   Leroy Sea, MD  levothyroxine (SYNTHROID) 75 MCG tablet Take  1 tablet (75 mcg total) by mouth daily at 6 (six) AM. 02/02/23   Sherryll Burger, Pratik D, DO  metoprolol tartrate (LOPRESSOR) 25 MG tablet Take 0.5 tablets (12.5 mg total) by mouth 2 (two) times daily. 02/01/23 03/08/23  Sherryll Burger, Pratik D, DO  multivitamin (RENA-VIT) TABS tablet Take 1 tablet by mouth daily.    [provider]  nitroGLYCERIN (NITROSTAT) 0.4 MG SL tablet Place 0.4 mg under the tongue every 5 (five) minutes as needed for chest pain.    [provider]  octreotide (SANDOSTATIN LAR) 20 MG injection Inject 20 mg into the muscle every 28 (twenty-eight) days. Patient not taking: Reported on 03/08/2023 11/15/22   Leroy Sea, MD  olmesartan (BENICAR) 20 MG tablet Take 20 mg by mouth daily. 01/14/23   [provider]  omeprazole (PRILOSEC) 20 MG capsule Take 1 capsule (20 mg total) by mouth 2 (two) times daily before a meal. Patient taking differently: Take 20 mg by mouth as needed (acid reflux). 02/01/23 03/08/23  Sherryll Burger, Pratik D, DO  ondansetron (ZOFRAN) 4 MG tablet Take 4 mg by mouth every 8 (eight) hours as needed. 02/27/23   [provider]  rosuvastatin (CRESTOR) 10 MG tablet Take 1 tablet by mouth once daily 06/06/22   Iran Ouch, [place] M, PA-C   Current Facility-Administered Medications  Medication Dose Route Frequency Provider Last Rate Last Admin   0.9 %  sodium chloride infusion (Manually program via Guardrails IV Fluids)   Intravenous Once Buena Irish, MD       ALPRAZolam Prudy Feeler) tablet 0.5 mg  0.5 mg Oral Daily PRN Buena Irish, MD       Melene Muller ON 05/03/2023] calcitRIOL (ROCALTROL) capsule 1.75 mcg  1.75 mcg Oral Q M,W,F-HD Buena Irish, MD       Chlorhexidine Gluconate Cloth 2 % PADS 6 each  6 each Topical Daily Buena Irish, MD   6 each at 05/01/23 2025   [START ON 05/03/2023] cinacalcet (SENSIPAR) tablet 30 mg  30 mg Oral Q M,W,F-HD Buena Irish, MD       folic acid (FOLVITE) tablet 1 mg  1 mg Oral Daily Buena Irish, MD       hydrOXYzine (ATARAX) tablet 25 mg  25 mg Oral Q6H PRN Buena Irish, MD       levETIRAcetam (KEPPRA) tablet 500 mg  500 mg Oral Daily Buena Irish, MD       Melene Muller ON 05/03/2023] levothyroxine (SYNTHROID) tablet 75 mcg  75 mcg Oral Q0600 Buena Irish, MD       melatonin tablet 3  mg  3 mg Oral QHS PRN Buena Irish, MD       metoprolol tartrate (LOPRESSOR) tablet 12.5 mg  12.5 mg Oral BID Buena Irish, MD       multivitamin (RENA-VIT) tablet 1 tablet  1 tablet Oral QHS Buena Irish, MD       ondansetron [facility]) tablet 4 mg  4 mg Oral Q6H PRN Buena Irish, MD       Or   ondansetron (ZOFRAN) injection 4 mg  4 mg Intravenous Q6H PRN Buena Irish, MD       Oral care mouth rinse  15 mL Mouth Rinse PRN Adefeso, [place], DO       sorbitol 70 % solution 30 mL  30 mL Oral Daily PRN Buena Irish, MD       Labs: Basic Metabolic Panel: Recent Labs  Lab 05/01/23 1456 05/02/23 0453  NA 138 138  K 3.2* 3.4*  CL  102 102  CO2 28 26  GLUCOSE 83 84  BUN 17 20  CREATININE 5.42* 5.88*  CALCIUM 8.2* 8.0*   Liver Function Tests: Recent Labs  Lab 05/01/23 1456  AST 25  ALT 14  ALKPHOS 57  BILITOT 0.7  PROT 5.2*  ALBUMIN 1.9*   No results for input(s): "LIPASE", "AMYLASE" in the last 168 hours. No results for input(s): "AMMONIA" in the last 168 hours. CBC: Recent Labs  Lab 05/01/23 1456 05/02/23 0453  WBC 8.5 6.0  HGB 5.9* 7.8*  HCT 18.2* 25.5*  MCV 113.8* 105.4*  PLT 218 188   Cardiac Enzymes: No results for input(s): "CKTOTAL", "CKMB", "CKMBINDEX", "TROPONINI" in the last 168 hours. CBG: Recent Labs  Lab 05/02/23 0800  GLUCAP 69*   Iron Studies: No results for input(s): "IRON", "TIBC", "TRANSFERRIN", "FERRITIN" in the last 72 hours. Studies/Results: DG Chest Portable 1 View  Result Date: 05/01/2023 CLINICAL DATA:  Chest pain. EXAM: PORTABLE CHEST 1 VIEW COMPARISON:  April 23, 2023. FINDINGS: Stable cardiomegaly. Status post coronary artery bypass graft. Left internal jugular dialysis catheter is again noted. Small left pleural effusion is noted with adjacent atelectasis or infiltrate. Mild right basilar atelectasis or infiltrate is noted. IMPRESSION: Small left pleural effusion with adjacent atelectasis or  infiltrate. Mild right basilar atelectasis or infiltrate is noted. Electronically Signed   By: Lupita Raider M.D.   On: 05/01/2023 17:09    ROS: Pertinent items are noted in HPI. Physical Exam: Vitals:   05/02/23 0426 05/02/23 0500 05/02/23 0600 05/02/23 0700  BP: (!) 158/44 (!) 184/55 (!) 176/49 (!) 186/49  Pulse: (!) 58 60 (!) 59 61  Resp: 14 13 (!) 27 14  Temp:      TempSrc:      SpO2: 100% 100% 100% 100%  Weight:      Height:          Weight change:   Intake/Output Summary (Last 24 hours) at 05/02/2023 0948 Last data filed at 05/02/2023 0120 Gross per 24 hour  Intake 357 ml  Output --  Net 357 ml   BP (!) 186/49   Pulse 61   Temp 97.7 F (36.5 C) (Oral)   Resp 14   Ht 5\' 2"  (1.575 m)   Wt 54.5 kg   SpO2 100%   BMI 21.98 kg/m  General appearance: no distress Head: Normocephalic, without obvious abnormality, atraumatic Eyes: negative findings: lids and lashes normal, conjunctivae and sclerae normal, and corneas clear Resp: clear to auscultation bilaterally Cardio: regular rate and rhythm, S1, S2 normal, no murmur, click, rub or gallop GI: soft, non-tender; bowel sounds normal; no masses,  no organomegaly Extremities: extremities normal, atraumatic, no cyanosis or edema Dialysis Access:  Dialysis Orders:  MWF at [facility] EDW 55kg TDC 2K /2.5Ca BFR 300 DFR 500 Calcitrol 0.4mcg PO q HD Mircera 200 mcg IV q 2 weeks- Last dose due 05/01/23   Assessment/Plan:  Symptomatic anemia - pt with chronic GIB due to AVM's and multiple admissions for blood transfusions.  She received 1 unit yesterday with significant improvement to 7.8 today.  Will plan to transfuse again today with HD today.  ESRD -  off schedule.  To have HD today and hopefully discharge to home and follow up with outpatient HD tomorrow to get back on schedule.  Hypertension/volume  - UF as tolerated with HD.  She is actually under her edw.  Anemia  - as above, for blood transfusion today with HD   Metabolic bone  disease -   continue with home meds  Nutrition -  renal diet  Disposition - hopeful discharge to home soon.  Irena Cords, MD [facility], [facility] 05/02/2023, 9:48 AM

## 2023-05-02 NOTE — Progress Notes (Signed)
Sherryl Manges RN,verified with Arman Filter, RN blood administration below.   UNMATCHED BLOOD PRODUCT NOTE  Compare the patient ID on the blood tag to the patient ID on the hospital armband and Blood Bank armband. Then confirm the unit number on the blood tag matches the unit number on the blood product.  If a discrepancy is discovered return the product to blood bank immediately.   Blood Product Type: Packed Red Blood Cells   Unit #: (Found on blood product bag, begins with W) [id number]  Product Code #: (Found on blood product bag, begins with E) [id number]   Start Time: 1800  Starting Rate: 120 ml/hr  Rate increase/decreased  (if applicable):    given on HD with UF.    ml/hr  Rate changed time (if applicable):  1815   Stop Time: 1835   All Other Documentation should be documented within the Blood Admin Flowsheet per policy.

## 2023-05-02 NOTE — Plan of Care (Signed)

## 2023-05-02 NOTE — TOC Initial Note (Signed)
Transition of Care [facility]) - Initial/Assessment Note    Patient Details  Name: Shawna [name] MRN: [id number] Date of Birth: 1941-02-03  [facility]) CM/SW Contact:    Villa Herb, LCSWA Phone Number: 05/02/2023, 2:20 PM  Clinical Narrative:                 Pt is high risk for readmission. Pt is active with [facility] as pt lives in [place], [place]. CSW spoke to L[place] with [facility] who states that pt is active with [facility] PT/RN services and they will need resumption orders. CSW requested that MD place the resumption HH orders. HH orders and D/C summary will need to be faxed to [phone]. TOC to follow.   Expected Discharge Plan: Home w Home Health Services Barriers to Discharge: No Barriers Identified   Patient Goals and CMS Choice Patient states their goals for this hospitalization and ongoing recovery are:: return home CMS Medicare.gov Compare Post Acute Care list provided to:: Patient Choice offered to / list presented to : Patient      Expected Discharge Plan and Services In-house Referral: Clinical Social Work Discharge Planning Services: CM Consult Post Acute Care Choice: Home Health Living arrangements for the past 2 months: Single Family Home                           HH Arranged: RN, PT          Prior Living Arrangements/Services Living arrangements for the past 2 months: Single Family Home Lives with:: Self Patient language and need for interpreter reviewed:: Yes Do you feel safe going back to the place where you live?: Yes      Need for Family Participation in Patient Care: Yes (Comment) Care giver support system in place?: Yes (comment) Current home services: DME, Home PT, Home RN Criminal Activity/Legal Involvement Pertinent to Current Situation/Hospitalization: No - Comment as needed  Activities of Daily Living   ADL Screening (condition at time of admission) Independently performs ADLs?: No Does the patient have a NEW difficulty with  bathing/dressing/toileting/self-feeding that is expected to last >3 days?: Yes (Initiates electronic notice to provider for possible OT consult) Does the patient have a NEW difficulty with getting in/out of bed, walking, or climbing stairs that is expected to last >3 days?: Yes (Initiates electronic notice to provider for possible PT consult) (husband helps her with moving around home) Does the patient have a NEW difficulty with communication that is expected to last >3 days?: No Is the patient deaf or have difficulty hearing?: No Does the patient have difficulty seeing, even when wearing glasses/contacts?: No Does the patient have difficulty concentrating, remembering, or making decisions?: No  Permission Sought/Granted                  Emotional Assessment Appearance:: Appears stated age Attitude/Demeanor/Rapport: Engaged Affect (typically observed): Accepting Orientation: : Oriented to Self, Oriented to Place, Oriented to  Time, Oriented to Situation Alcohol / Substance Use: Not Applicable Psych Involvement: No (comment)  Admission diagnosis:  Acute blood loss anemia [D62] Acute on chronic blood loss anemia [D62] Patient Active Problem List   Diagnosis Date Noted   Symptomatic anemia 03/08/2023   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   Acute blood loss anemia 01/27/2023   Paroxysmal atrial flutter (HCC) 12/14/2022   Pressure injury of skin 12/01/2022   Rectal bleeding 11/30/2022   Generalized abdominal pain 11/10/2022   Ileus (HCC) 11/04/2022  Periumbilical abdominal pain 11/04/2022   Macrocytic anemia 10/29/2022   Anemia of chronic disease 10/29/2022   Upper GI bleed 10/29/2022   Iron deficiency anemia due to chronic blood loss 08/26/2022   Benign neoplasm of transverse colon 08/11/2022   Benign neoplasm of descending colon 08/11/2022   Benign neoplasm of sigmoid colon 08/11/2022   Angiodysplasia of duodenum 08/11/2022   Delirium 04/22/2022   History of  arteriovenous malformation (AVM) 04/22/2022   Acute metabolic encephalopathy 11/09/2021   NSTEMI, initial episode of care (HCC) 10/19/2021   Acute on chronic blood loss anemia 09/09/2021   Ischemic cardiomyopathy    Atrial fibrillation, chronic (HCC) 05/07/2021   Neuropathy 04/23/2021   Anemia associated with chronic renal failure 04/13/2021   Left knee pain 03/15/2021   Moderate protein-calorie malnutrition (HCC) 02/27/2021   Hypokalemia 02/27/2021   COVID-19 virus infection 02/03/2021   Leukocytosis 02/03/2021   Acquired hypothyroidism 02/03/2021   Myositis 12/03/2020   Ataxia 12/02/2020   Diabetes mellitus type 2 in nonobese (HCC) 11/10/2020   Failure to thrive in adult 11/09/2020   Dialysis AV fistula malfunction, initial encounter (HCC)    Jugular vein occlusion, right (HCC)    Failure of surgically constructed arteriovenous fistula (HCC) 10/03/2020   Myoclonus 08/31/2020   Clotted renal dialysis AV graft, initial encounter (HCC)    Hemodialysis-associated hypotension    Irritable bowel syndrome 02/25/2020   Adenomatous duodenal polyp 09/10/2019   History of GI bleed 09/10/2019   Hand steal syndrome (HCC) 08/01/2017   CAD (coronary artery disease) 06/05/2017   Intestinal ischemia (HCC)    Complication of vascular access for dialysis 03/19/2017   Preoperative clearance 01/25/2017   H/O non-ST elevation myocardial infarction (NSTEMI) 10/24/2016   Non-ST elevation (NSTEMI) myocardial infarction [facility])    Heme positive stool    Cardiac arrest [facility])    Palliative care encounter    Goals of care, counseling/discussion    Flash pulmonary edema (HCC) 04/06/2016   History of colon cancer 01/27/2016   History of ovarian cancer 01/27/2016   PAF (paroxysmal atrial fibrillation) (HCC) 10/14/2015   Malignant neoplasm of right ovary (HCC) 10/14/2015   Wide-complex tachycardia 09/08/2015   SVT (supraventricular tachycardia) (HCC) 09/08/2015   Essential hypertension    Dyspnea     Constipation 03/12/2013   Abdominal wall pain in left flank 03/12/2013   Occlusion and stenosis of carotid artery without mention of cerebral infarction 01/24/2013   Hx of CABG 07/05/2012   Carotid artery disease (HCC) 07/05/2012   Mitral regurgitation 06/12/2012   Non-STEMI (non-ST elevated myocardial infarction) (HCC) 06/08/2012   Chronic combined systolic and diastolic CHF (congestive heart failure) (HCC) 05/02/2012   AVM (arteriovenous malformation) of small bowel, acquired with hemorrhage 01/20/2012   GERD (gastroesophageal reflux disease) 01/09/2012   Mixed hyperlipidemia 01/05/2012   Atherosclerotic heart disease of native coronary artery without angina pectoris 12/16/2011   Anxiety disorder 05/04/2011   End-stage renal disease on hemodialysis (HCC) 04/29/2011   Gout 04/29/2011   PCP:  Practice, [facility]:   [facility] [address], [place] - [address] [place] [address] Phone: [phone] Fax: [phone]  [facility] (ESRD Bundle Only) - [place], [place] - [address] [place] [address] Phone: [phone] Fax: [phone]  [facility] - [address] [place], [place] - [address] [address] [facility] [address] [place] [address] Phone: [phone] Fax: [phone]  [facility] [address] [place]  [address] Phone: [phone] Fax: [phone]     Social Determinants of Health (SDOH) Social History: SDOH Screenings   Food Insecurity: No Food Insecurity (05/01/2023)  Housing: Low Risk  (05/01/2023)  Transportation Needs: No Transportation Needs (05/01/2023)  Utilities: Not At Risk (05/01/2023)  Financial Resource Strain: Low Risk  (12/14/2018)  Physical Activity: Unknown (12/14/2018)  Social Connections: Unknown (12/14/2018)  Stress: No Stress Concern Present (12/14/2018)  Tobacco Use: Low Risk  (05/01/2023)  Health Literacy: Adequate Health Literacy (03/14/2023)   Received  from [facility]  Recent Concern: Health Literacy - Inadequate Health Literacy (02/04/2023)   Received from [facility]   SDOH Interventions:     Readmission Risk Interventions    05/02/2023    2:17 PM 01/29/2023   11:08 AM 10/31/2022   12:45 PM  Readmission Risk Prevention Plan  Transportation Screening Complete Complete Complete  Medication Review [facility]) Complete Complete Complete  PCP or Specialist appointment within 3-5 days of discharge  Complete   HRI or Home Care Consult Complete Complete Complete  SW Recovery Care/Counseling Consult Complete Complete Complete  Palliative Care Screening Not Applicable Not Applicable Not Applicable  Skilled Nursing Facility Not Applicable Not Applicable Not Applicable

## 2023-05-02 NOTE — Care Management Obs Status (Signed)
MEDICARE OBSERVATION STATUS NOTIFICATION   Patient Details  Name: DEMAYA JANOFF MRN: [id number] Date of Birth: 04-20-1941   Medicare Observation Status Notification Given:  Yes    Villa Herb, LCSWA 05/02/2023, 3:08 PM

## 2023-05-02 NOTE — Care Management CC44 (Signed)
Condition Code 44 Documentation Completed  Patient Details  Name: Shawna [name] MRN: [id number] Date of Birth: [birth year]   Condition Code 44 given:  Yes Patient signature on Condition Code 44 notice:  Yes Documentation of 2 MD's agreement:  Yes Code 44 added to claim:  Yes    Villa Herb, LCSWA 05/02/2023, 3:08 PM

## 2023-05-02 NOTE — Progress Notes (Cosign Needed)
UNMATCHED BLOOD PRODUCT NOTE  Compare the patient ID on the blood tag to the patient ID on the hospital armband and Blood Bank armband. Then confirm the unit number on the blood tag matches the unit number on the blood product.  If a discrepancy is discovered return the product to blood bank immediately.   Blood Product Type: Packed Red Blood Cells   Unit #: (Found on blood product bag, begins with W) [id number]  Product Code #: (Found on blood product bag, begins with E) [id number]   Start Time: 1800  Starting Rate: 120 ml/hr  Rate increase/decreased  (if applicable):    given on HD with UF.    ml/hr  Rate changed time (if applicable):  1815   Stop Time: 1835   All Other Documentation should be documented within the Blood Admin Flowsheet per policy.

## 2023-05-02 NOTE — Progress Notes (Signed)
   HEMODIALYSIS TREATMENT NOTE:  Uneventful 3 hour heparin-free treatment completed using left internal jugular tunneled cath.  Cath exit site is unremarkable. 1L removed.  One unit pRBCs given.  Unable to document blood transfusion in [facility] flowsheet - see unmatched product note co-signed by Melvern Banker, RN.  Post-treatment:  05/02/23 1915  Vitals  Temp 97.9 F (36.6 C)  Temp Source Oral  BP (!) 152/80  MAP (mmHg) 98  BP Location Right Wrist  BP Method Automatic  Patient Position (if appropriate) Sitting  Pulse Rate 65  Pulse Rate Source Monitor  ECG Heart Rate 64  Resp 18  Oxygen Therapy  SpO2 100 %  O2 Device Room Air  Post Treatment  Dialyzer Clearance Lightly streaked  Hemodialysis Intake (mL) 312 mL  Liters Processed 67.6  Fluid Removed (mL) 1000 mL  Tolerated HD Treatment Yes  Post-Hemodialysis Comments New goal met  Hemodialysis Catheter Left Subclavian Double lumen Temporary (Non-Tunneled)  Placement Date: 06/09/22   Placed prior to admission: Yes  Orientation: Left  Access Location: Subclavian  Hemodialysis Catheter Type: Double lumen Temporary (Non-Tunneled)  Site Condition No complications  Blue Lumen Status Flushed;Heparin locked;Dead end cap in place  Red Lumen Status Flushed;Heparin locked;Dead end cap in place  Purple Lumen Status N/A  Catheter fill solution Heparin 1000 units/ml  Catheter fill volume (Arterial) 1.9 cc  Catheter fill volume (Venous) 1.9  Dressing Type Transparent;Tube stabilization device  Dressing Status Antimicrobial disc in place;Clean, Dry, Intact  Interventions New dressing  Drainage Description None  Dressing Change Due 05/09/23  Post treatment catheter status Capped and Clamped    Pt was returned to ICU10 for imminent discharge.   Arman Filter, RN AP KDU

## 2023-05-03 LAB — TYPE AND SCREEN
ABO/RH(D): O POS
Antibody Screen: NEGATIVE
Donor AG Type: NEGATIVE
Donor AG Type: NEGATIVE
Unit division: 0
Unit division: 0

## 2023-05-03 LAB — BPAM RBC
Blood Product Expiration Date: 202411152359
Blood Product Expiration Date: 202411242359
ISSUE DATE / TIME: 202410212342
ISSUE DATE / TIME: 202410221742
Unit Type and Rh: 5100
Unit Type and Rh: 5100

## 2023-05-03 LAB — HEPATITIS B SURFACE ANTIBODY, QUANTITATIVE: Hep B S AB Quant (Post): 151 m[IU]/mL

## 2023-05-03 NOTE — Progress Notes (Signed)
Given discharge instructions and home meds to husband, explained and verified understanding. Removed peripheral Ivs. Vital signs stable. Discharged per wheelchair and assisted by SWOT nurses to main entrance parking lot [phone].

## 2023-05-08 ENCOUNTER — Telehealth (INDEPENDENT_AMBULATORY_CARE_PROVIDER_SITE_OTHER): Payer: Self-pay

## 2023-05-10 ENCOUNTER — Emergency Department (HOSPITAL_COMMUNITY): Payer: Medicare Other

## 2023-05-10 ENCOUNTER — Other Ambulatory Visit: Payer: Self-pay

## 2023-05-10 ENCOUNTER — Encounter (HOSPITAL_COMMUNITY): Payer: Self-pay | Admitting: Emergency Medicine

## 2023-05-10 ENCOUNTER — Emergency Department (HOSPITAL_COMMUNITY)
Admission: EM | Admit: 2023-05-10 | Discharge: 2023-05-10 | Disposition: A | Discharge status: Home / Self Care | Payer: Medicare Other | Attending: Emergency Medicine | Admitting: Emergency Medicine

## 2023-05-10 DIAGNOSIS — R079 Chest pain, unspecified: Secondary | ICD-10-CM | POA: Diagnosis not present

## 2023-05-10 DIAGNOSIS — M791 Myalgia, unspecified site: Secondary | ICD-10-CM | POA: Insufficient documentation

## 2023-05-10 DIAGNOSIS — R531 Weakness: Secondary | ICD-10-CM | POA: Insufficient documentation

## 2023-05-10 LAB — TROPONIN I (HIGH SENSITIVITY)
Troponin I (High Sensitivity): 33 ng/L — ABNORMAL HIGH (ref <18)
Troponin I (High Sensitivity): 36 ng/L — ABNORMAL HIGH (ref <18)

## 2023-05-10 LAB — COMPREHENSIVE METABOLIC PANEL
ALT: 15 U/L (ref 0–44)
AST: 24 U/L (ref 15–41)
Albumin: 1.8 g/dL — ABNORMAL LOW (ref 3.5–5.0)
Alkaline Phosphatase: 68 U/L (ref 38–126)
Anion gap: 10 (ref 5–15)
BUN: 18 mg/dL (ref 8–23)
CO2: 25 mmol/L (ref 22–32)
Calcium: 8.2 mg/dL — ABNORMAL LOW (ref 8.9–10.3)
Chloride: 100 mmol/L (ref 98–111)
Creatinine, Ser: 5.46 mg/dL — ABNORMAL HIGH (ref 0.44–1.00)
GFR, Estimated: 7 mL/min — ABNORMAL LOW (ref >60)
Glucose, Bld: 108 mg/dL — ABNORMAL HIGH (ref 70–99)
Potassium: 3.3 mmol/L — ABNORMAL LOW (ref 3.5–5.1)
Sodium: 135 mmol/L (ref 135–145)
Total Bilirubin: 0.8 mg/dL (ref 0.3–1.2)
Total Protein: 5.3 g/dL — ABNORMAL LOW (ref 6.5–8.1)

## 2023-05-10 LAB — CBC
HCT: 25.5 % — ABNORMAL LOW (ref 36.0–46.0)
Hemoglobin: 8.3 g/dL — ABNORMAL LOW (ref 12.0–15.0)
MCH: 32.7 pg (ref 26.0–34.0)
MCHC: 32.5 g/dL (ref 30.0–36.0)
MCV: 100.4 fL — ABNORMAL HIGH (ref 80.0–100.0)
Platelets: 275 10*3/uL (ref 150–400)
RBC: 2.54 MIL/uL — ABNORMAL LOW (ref 3.87–5.11)
RDW: 27.1 % — ABNORMAL HIGH (ref 11.5–15.5)
WBC: 8.7 10*3/uL (ref 4.0–10.5)
nRBC: 0 % (ref 0.0–0.2)

## 2023-05-10 LAB — CBG MONITORING, ED: Glucose-Capillary: 130 mg/dL — ABNORMAL HIGH (ref 70–99)

## 2023-05-10 MED ORDER — OXYCODONE HCL 5 MG PO TABS: 5.0000 mg | ORAL_TABLET | Freq: Once | ORAL | Status: DC

## 2023-05-10 NOTE — Discharge Instructions (Signed)
Today in the ER for some swelling and generalized weakness, your workup was reassuring.  It is important for you to go to dialysis tomorrow.  Come back to the ER for new or worsening symptoms.

## 2023-05-10 NOTE — ED Notes (Signed)
Pt didn't got to dialysis today. Usually M,W,F

## 2023-05-10 NOTE — ED Triage Notes (Signed)
Pt presents with increased weakness, dialysis pt M-W-F, last dialysis treatment was Monday, didn't go today d/t weakness, husband takes pt to dialysis in [place], but lives in [place], may need social work consult to help find resources.

## 2023-05-10 NOTE — ED Notes (Signed)
Pt ambulated well with walker with no pain and little to no assistance; RN notified

## 2023-05-10 NOTE — ED Provider Notes (Signed)
[place] EMERGENCY DEPARTMENT AT [facility] Provider Note   CSN: [id number] Arrival date & time: 05/10/23  1208     History  Chief Complaint  Patient presents with   Weakness    Shawna [name] is a 82 y.o. female.  Patient complains of bodyaches chest pain and swelling.  Patient was unable to go to dialysis today because she felt so bad.  Patient has a history of missing dialysis.  Patient's husband states that he brought her to the emergency department because she was too weak for dialysis.  Complains of pain in her chest and pain in her bones.  Patient has had multiple admissions for swelling and volume overload.  The history is provided by the patient. No language interpreter was used.  Weakness      Home Medications Prior to Admission medications   Medication Sig Start Date End Date Taking? Authorizing Provider  ALPRAZolam Prudy Feeler) 0.5 MG tablet Take 1 tablet (0.5 mg total) by mouth daily as needed for up to 4 doses for anxiety. 12/19/22   Lanae Boast, MD  amiodarone (PACERONE) 100 MG tablet Take 1 tablet (100 mg total) by mouth daily. 02/02/23 05/02/23  Sherryll Burger, Pratik D, DO  benzonatate (TESSALON) 100 MG capsule Take 1 capsule (100 mg total) by mouth 3 (three) times daily as needed for cough. 01/18/23   Tilden Fossa, MD  calcitRIOL (ROCALTROL) 0.25 MCG capsule Take 7 capsules (1.75 mcg total) by mouth every Monday, Wednesday, and Friday with hemodialysis. 12/21/22   Lanae Boast, MD  cinacalcet (SENSIPAR) 30 MG tablet Take 1 tablet (30 mg total) by mouth every Monday, Wednesday, and Friday with hemodialysis. 12/19/22   Lanae Boast, MD  cyanocobalamin (VITAMIN B12) 1000 MCG/ML injection Inject 1,000 mcg into the muscle every 30 (thirty) days.    [provider]  folic acid (FOLVITE) 1 MG tablet Take 1 mg by mouth daily. 05/24/22   [provider]  gabapentin (NEURONTIN) 100 MG capsule Take 1 capsule (100 mg total) by mouth every evening. 12/13/22   Jerald Kief, MD  hydrALAZINE (APRESOLINE) 25 MG tablet Take 1 tablet (25 mg total) by mouth 2 (two) times daily. Hold for sbp <110 12/27/22   Lanae Boast, MD  hydrOXYzine (ATARAX) 25 MG tablet Take 25 mg by mouth every 6 (six) hours as needed for itching. 06/06/22   [provider]  levETIRAcetam (KEPPRA) 500 MG tablet Take 1 tablet (500 mg total) by mouth daily. 11/15/22   Leroy Sea, MD  levothyroxine (SYNTHROID) 75 MCG tablet Take 1 tablet (75 mcg total) by mouth daily at 6 (six) AM. 02/02/23   Sherryll Burger, Pratik D, DO  meclizine (ANTIVERT) 25 MG tablet Take 25 mg by mouth every 4 (four) hours as needed for dizziness. 04/19/23   [provider]  metoprolol tartrate (LOPRESSOR) 25 MG tablet Take 0.5 tablets (12.5 mg total) by mouth 2 (two) times daily. 02/01/23 05/02/23  Sherryll Burger, Pratik D, DO  multivitamin (RENA-VIT) TABS tablet Take 1 tablet by mouth daily.    [provider]  nitroGLYCERIN (NITROSTAT) 0.4 MG SL tablet Place 0.4 mg under the tongue every 5 (five) minutes as needed for chest pain.    [provider]  octreotide (SANDOSTATIN LAR) 20 MG injection Inject 20 mg into the muscle every 28 (twenty-eight) days. 11/15/22   Leroy Sea, MD  olmesartan (BENICAR) 20 MG tablet Take 20 mg by mouth daily. 01/14/23   [provider]  omeprazole (PRILOSEC) 20  MG capsule Take 1 capsule (20 mg total) by mouth 2 (two) times daily before a meal. Patient taking differently: Take 20 mg by mouth as needed (acid reflux). 02/01/23 05/02/23  Sherryll Burger, Pratik D, DO  ondansetron (ZOFRAN) 4 MG tablet Take 4 mg by mouth every 8 (eight) hours as needed. 02/27/23   [provider]  rosuvastatin (CRESTOR) 10 MG tablet Take 1 tablet by mouth once daily 06/06/22   Iran Ouch, [place] M, PA-C      Allergies    Amlodipine, Aspirin, Nitrofurantoin, Ranexa [ranolazine], Bactrim [sulfamethoxazole-trimethoprim], Iodinated contrast media, Iron, Tylenol [acetaminophen], Gabapentin,  Sucroferric oxyhydroxide, Levaquin [levofloxacin in d5w], Levofloxacin, Plavix [clopidogrel bisulfate], Protonix [pantoprazole sodium], and Venofer [ferric oxide]    Review of Systems   Review of Systems  Neurological:  Positive for weakness.  All other systems reviewed and are negative.   Physical Exam Updated Vital Signs BP (!) 172/53   Pulse (!) 51   Temp 97.9 F (36.6 C) (Oral)   Resp 20   Ht 5\' 2"  (1.575 m)   Wt 53.7 kg   SpO2 96%   BMI 21.65 kg/m  Physical Exam Vitals and nursing note reviewed.  Constitutional:      Appearance: She is well-developed.  HENT:     Head: Normocephalic.     Nose: Nose normal.  Cardiovascular:     Rate and Rhythm: Normal rate.  Pulmonary:     Effort: Pulmonary effort is normal.  Abdominal:     General: There is no distension.  Musculoskeletal:        General: Swelling present.     Cervical back: Normal range of motion.  Skin:    General: Skin is warm.  Neurological:     General: No focal deficit present.     Mental Status: She is alert.     ED Results / Procedures / Treatments   Labs (all labs ordered are listed, but only abnormal results are displayed) Labs Reviewed  CBC - Abnormal; Notable for the following components:      Result Value   RBC 2.54 (*)    Hemoglobin 8.3 (*)    HCT 25.5 (*)    MCV 100.4 (*)    RDW 27.1 (*)    All other components within normal limits  COMPREHENSIVE METABOLIC PANEL - Abnormal; Notable for the following components:   Potassium 3.3 (*)    Glucose, Bld 108 (*)    Creatinine, Ser 5.46 (*)    Calcium 8.2 (*)    Total Protein 5.3 (*)    Albumin 1.8 (*)    GFR, Estimated 7 (*)    All other components within normal limits  CBG MONITORING, ED - Abnormal; Notable for the following components:   Glucose-Capillary 130 (*)    All other components within normal limits  TROPONIN I (HIGH SENSITIVITY) - Abnormal; Notable for the following components:   Troponin I (High Sensitivity) 33 (*)    All  other components within normal limits  TROPONIN I (HIGH SENSITIVITY)    EKG None  Radiology DG Chest 2 View  Result Date: 05/10/2023 CLINICAL DATA:  Weakness. EXAM: CHEST - 2 VIEW COMPARISON:  May 01, 2023 chest x-ray. FINDINGS: Similar small bilateral pleural effusions. Similar overlying bibasilar opacities. Enlarged cardiac silhouette. No visible pneumothorax. CABG and median sternotomy. Left dual lumen central venous catheter with the tip projecting at the right atrium. Polyarticular degenerative change including severe right shoulder degenerative change. IMPRESSION: 1. Similar small bilateral pleural effusions with overlying bibasilar  opacities that could represent atelectasis, aspiration, and/or pneumonia. 2. Similar cardiomegaly. Electronically Signed   By: Feliberto Harts M.D.   On: 05/10/2023 16:30    Procedures Procedures    Medications Ordered in ED Medications - No data to display  ED Course/ Medical Decision Making/ A&P                                 Medical Decision Making Patient complains of pain in her arms and legs.  Patient complains of pain in her chest.  Amount and/or Complexity of Data Reviewed Independent Historian: spouse    Details: She is here with her husband he states patient was unable to go to dialysis today because she was feeling so bad. Labs: ordered. Radiology: ordered and independent interpretation performed. Decision-making details documented in ED Course.    Details: Chest x-ray shows bilateral pleural effusions Discussion of management or test interpretation with external provider(s): Pt's  care turned over to [facility], PA-C second troponin is pending           Final Clinical Impression(s) / ED Diagnoses Final diagnoses:  Weakness  Chest pain, unspecified type  Myalgia    Rx / DC Orders ED Discharge Orders     None         Osie Cheeks 05/10/23 1919    Terrilee Files, MD 05/11/23 1052

## 2023-05-10 NOTE — ED Provider Notes (Signed)
  Physical Exam  BP (!) 172/53   Pulse (!) 51   Temp 97.9 F (36.6 C) (Oral)   Resp 20   Ht 5\' 2"  (1.575 m)   Wt 53.7 kg   SpO2 96%   BMI 21.65 kg/m   Physical Exam  Procedures  Procedures  ED Course / MDM    Medical Decision Making Amount and/or Complexity of Data Reviewed Labs: ordered. Radiology: ordered.  Risk Prescription drug management.  This patient's care was assumed by me at shift change. The tests pending are 2nd troponin. Plan is for discharge after results if delta is negative patient can ambulate.   Patient was re-evaluated by me as well. I discussed their result with them and plan is for charge, she ambulated well with walker, her husband is at bedside and is able to take her to dialysis tomorrow which is felt to be the root cause of her symptoms today.  They were advised on follow-up and strict return precautions.         Josem Kaufmann 05/10/23 1610    Terrilee Files, MD 05/11/23 [phone]

## 2023-05-13 ENCOUNTER — Emergency Department (HOSPITAL_COMMUNITY): Payer: Medicare Other

## 2023-05-13 ENCOUNTER — Encounter (HOSPITAL_COMMUNITY): Payer: Self-pay

## 2023-05-13 ENCOUNTER — Other Ambulatory Visit: Payer: Self-pay

## 2023-05-13 ENCOUNTER — Emergency Department (HOSPITAL_COMMUNITY)
Admission: EM | Admit: 2023-05-13 | Discharge: 2023-05-14 | Disposition: A | Discharge status: Home / Self Care | Payer: Medicare Other | Attending: Emergency Medicine | Admitting: Emergency Medicine

## 2023-05-13 DIAGNOSIS — I5022 Chronic systolic (congestive) heart failure: Secondary | ICD-10-CM | POA: Diagnosis not present

## 2023-05-13 DIAGNOSIS — Z8673 Personal history of transient ischemic attack (TIA), and cerebral infarction without residual deficits: Secondary | ICD-10-CM | POA: Diagnosis not present

## 2023-05-13 DIAGNOSIS — Z79899 Other long term (current) drug therapy: Secondary | ICD-10-CM | POA: Insufficient documentation

## 2023-05-13 DIAGNOSIS — R531 Weakness: Secondary | ICD-10-CM | POA: Diagnosis present

## 2023-05-13 DIAGNOSIS — I132 Hypertensive heart and chronic kidney disease with heart failure and with stage 5 chronic kidney disease, or end stage renal disease: Secondary | ICD-10-CM | POA: Insufficient documentation

## 2023-05-13 DIAGNOSIS — Z951 Presence of aortocoronary bypass graft: Secondary | ICD-10-CM | POA: Insufficient documentation

## 2023-05-13 DIAGNOSIS — N289 Disorder of kidney and ureter, unspecified: Secondary | ICD-10-CM

## 2023-05-13 DIAGNOSIS — I2510 Atherosclerotic heart disease of native coronary artery without angina pectoris: Secondary | ICD-10-CM | POA: Insufficient documentation

## 2023-05-13 DIAGNOSIS — N186 End stage renal disease: Secondary | ICD-10-CM | POA: Insufficient documentation

## 2023-05-13 DIAGNOSIS — E8779 Other fluid overload: Secondary | ICD-10-CM | POA: Insufficient documentation

## 2023-05-13 DIAGNOSIS — Z992 Dependence on renal dialysis: Secondary | ICD-10-CM | POA: Diagnosis not present

## 2023-05-13 DIAGNOSIS — Z85038 Personal history of other malignant neoplasm of large intestine: Secondary | ICD-10-CM | POA: Diagnosis not present

## 2023-05-13 DIAGNOSIS — R600 Localized edema: Secondary | ICD-10-CM | POA: Insufficient documentation

## 2023-05-13 DIAGNOSIS — Z8543 Personal history of malignant neoplasm of ovary: Secondary | ICD-10-CM | POA: Insufficient documentation

## 2023-05-13 LAB — CBC WITH DIFFERENTIAL/PLATELET
Abs Immature Granulocytes: 0.02 10*3/uL (ref 0.00–0.07)
Basophils Absolute: 0.1 10*3/uL (ref 0.0–0.1)
Basophils Relative: 2 %
Eosinophils Absolute: 0.1 10*3/uL (ref 0.0–0.5)
Eosinophils Relative: 2 %
HCT: 25 % — ABNORMAL LOW (ref 36.0–46.0)
Hemoglobin: 8 g/dL — ABNORMAL LOW (ref 12.0–15.0)
Immature Granulocytes: 0 %
Lymphocytes Relative: 8 %
Lymphs Abs: 0.5 10*3/uL — ABNORMAL LOW (ref 0.7–4.0)
MCH: 33.1 pg (ref 26.0–34.0)
MCHC: 32 g/dL (ref 30.0–36.0)
MCV: 103.3 fL — ABNORMAL HIGH (ref 80.0–100.0)
Monocytes Absolute: 0.4 10*3/uL (ref 0.1–1.0)
Monocytes Relative: 7 %
Neutro Abs: 4.9 10*3/uL (ref 1.7–7.7)
Neutrophils Relative %: 81 %
Platelets: 245 10*3/uL (ref 150–400)
RBC: 2.42 MIL/uL — ABNORMAL LOW (ref 3.87–5.11)
RDW: 27.9 % — ABNORMAL HIGH (ref 11.5–15.5)
WBC: 6 10*3/uL (ref 4.0–10.5)
nRBC: 0 % (ref 0.0–0.2)

## 2023-05-13 LAB — COMPREHENSIVE METABOLIC PANEL
ALT: 12 U/L (ref 0–44)
AST: 19 U/L (ref 15–41)
Albumin: 1.9 g/dL — ABNORMAL LOW (ref 3.5–5.0)
Alkaline Phosphatase: 69 U/L (ref 38–126)
Anion gap: 8 (ref 5–15)
BUN: 19 mg/dL (ref 8–23)
CO2: 30 mmol/L (ref 22–32)
Calcium: 8.4 mg/dL — ABNORMAL LOW (ref 8.9–10.3)
Chloride: 104 mmol/L (ref 98–111)
Creatinine, Ser: 5.43 mg/dL — ABNORMAL HIGH (ref 0.44–1.00)
GFR, Estimated: 7 mL/min — ABNORMAL LOW (ref >60)
Glucose, Bld: 96 mg/dL (ref 70–99)
Potassium: 3.1 mmol/L — ABNORMAL LOW (ref 3.5–5.1)
Sodium: 142 mmol/L (ref 135–145)
Total Bilirubin: 1 mg/dL (ref 0.3–1.2)
Total Protein: 5.3 g/dL — ABNORMAL LOW (ref 6.5–8.1)

## 2023-05-13 LAB — BRAIN NATRIURETIC PEPTIDE: B Natriuretic Peptide: 3351 pg/mL — ABNORMAL HIGH (ref 0.0–100.0)

## 2023-05-13 MED ORDER — ALTEPLASE 2 MG IJ SOLR
2.0000 mg | Freq: Once | INTRAMUSCULAR | Status: DC | PRN
Start: 2023-05-13 — End: 2023-05-14

## 2023-05-13 MED ORDER — CHLORHEXIDINE GLUCONATE CLOTH 2 % EX PADS: 6.0000 | MEDICATED_PAD | Freq: Every day | CUTANEOUS | Status: DC

## 2023-05-13 MED ORDER — HEPARIN SODIUM (PORCINE) 1000 UNIT/ML DIALYSIS
1000.0000 [IU] | INTRAMUSCULAR | Status: DC | PRN
  Administered 2023-05-14: 3800 [IU]

## 2023-05-13 NOTE — ED Provider Notes (Signed)
[place] EMERGENCY DEPARTMENT AT [facility] Provider Note  CSN: [id number] Arrival date & time: 05/13/23 1157  Chief Complaint(s) Edema  HPI Shawna [name] is a 82 y.o. female end-stage renal disease on Monday, Wednesday, Friday dialysis, paroxysmal atrial fibrillation, hypertension, hyperlipidemia, presenting with weakness.  Patient was seen in the emergency department on Wednesday after having not gone to dialysis due to weakness.  She ended up going to dialysis the next day.  She did not go to dialysis yesterday (Friday).  Today she reports increased dyspnea, leg swelling, arm swelling, puffiness of the face.  She reports this feels similar to when she is retaining fluid.  She does not make any urine.  She denies any fevers or chills, chest pain.  She denies any abdominal pain.  They tried to call the dialysis center today but they had no room for her.  No other new symptoms.  She reports just continued ongoing weakness.   Past Medical History Past Medical History:  Diagnosis Date   Acute on chronic respiratory failure with hypoxia (HCC) 10/10/2016   Anxiety    Arthritis    AVM (arteriovenous malformation) of colon    CAD (coronary artery disease)    a. s/p CABG in 2013 b. DES to D1 in 10/2016. c. cath in 07/2018 showing patent grafts with occlusion of D1 at prior stent site and progression of PDA disease --> medical management recommended   Carotid artery disease (HCC)    a. 60-79% LICA, 03/2012    Chronic bronchitis (HCC)    Chronic HFrEF (heart failure with reduced ejection fraction) (HCC)    Colon cancer (HCC) 1992   Esophageal stricture    ESRD on hemodialysis (HCC)    ESRD due to HTN, started dialysis 2011 and gets HD at [facility] with Dr Fausto Skillern on MWF schedule.  Access is LUA AVF as of Sept 2014.    GERD (gastroesophageal reflux disease)    High cholesterol 12/2011   History of blood transfusion 07/2011; 12/2011; 01/2012 X 2; 04/2012   History of gout    History  of lower GI bleeding    Hypertension    Iron deficiency anemia    Jugular vein occlusion, right (HCC)    Mitral regurgitation    a. Moderate by echo, 02/2012   Mitral valve disease    NSVT (nonsustained ventricular tachycardia) (HCC)    Ovarian cancer (HCC) 1992   PAF (paroxysmal atrial fibrillation) (HCC)    Pneumonia ~ 2009   PUD (peptic ulcer disease)    TIA (transient ischemic attack)    Tricuspid valve disease    Patient Active Problem List   Diagnosis Date Noted   Symptomatic anemia 03/08/2023   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   Acute blood loss anemia 01/27/2023   Paroxysmal atrial flutter (HCC) 12/14/2022   Pressure injury of skin 12/01/2022   Rectal bleeding 11/30/2022   Generalized abdominal pain 11/10/2022   Ileus (HCC) 11/04/2022   Periumbilical abdominal pain 11/04/2022   Macrocytic anemia 10/29/2022   Anemia of chronic disease 10/29/2022   Upper GI bleed 10/29/2022   Iron deficiency anemia due to chronic blood loss 08/26/2022   Benign neoplasm of transverse colon 08/11/2022   Benign neoplasm of descending colon 08/11/2022   Benign neoplasm of sigmoid colon 08/11/2022   Angiodysplasia of duodenum 08/11/2022   Delirium 04/22/2022   History of arteriovenous malformation (AVM) 04/22/2022   Acute metabolic encephalopathy 11/09/2021   NSTEMI, initial episode of care [facility]) 10/19/2021  Acute on chronic blood loss anemia 09/09/2021   Ischemic cardiomyopathy    Atrial fibrillation, chronic (HCC) 05/07/2021   Neuropathy 04/23/2021   Anemia associated with chronic renal failure 04/13/2021   Left knee pain 03/15/2021   Moderate protein-calorie malnutrition (HCC) 02/27/2021   Hypokalemia 02/27/2021   COVID-19 virus infection 02/03/2021   Leukocytosis 02/03/2021   Acquired hypothyroidism 02/03/2021   Myositis 12/03/2020   Ataxia 12/02/2020   Diabetes mellitus type 2 in nonobese (HCC) 11/10/2020   Failure to thrive in adult 11/09/2020   Dialysis  AV fistula malfunction, initial encounter (HCC)    Jugular vein occlusion, right (HCC)    Failure of surgically constructed arteriovenous fistula (HCC) 10/03/2020   Myoclonus 08/31/2020   Clotted renal dialysis AV graft, initial encounter (HCC)    Hemodialysis-associated hypotension    Irritable bowel syndrome 02/25/2020   Adenomatous duodenal polyp 09/10/2019   History of GI bleed 09/10/2019   Hand steal syndrome (HCC) 08/01/2017   CAD (coronary artery disease) 06/05/2017   Intestinal ischemia (HCC)    Complication of vascular access for dialysis 03/19/2017   Preoperative clearance 01/25/2017   H/O non-ST elevation myocardial infarction (NSTEMI) 10/24/2016   Non-ST elevation (NSTEMI) myocardial infarction [facility])    Heme positive stool    Cardiac arrest [facility])    Palliative care encounter    Goals of care, counseling/discussion    Flash pulmonary edema (HCC) 04/06/2016   History of colon cancer 01/27/2016   History of ovarian cancer 01/27/2016   PAF (paroxysmal atrial fibrillation) (HCC) 10/14/2015   Malignant neoplasm of right ovary (HCC) 10/14/2015   Wide-complex tachycardia 09/08/2015   SVT (supraventricular tachycardia) (HCC) 09/08/2015   Essential hypertension    Dyspnea    Constipation 03/12/2013   Abdominal wall pain in left flank 03/12/2013   Occlusion and stenosis of carotid artery without mention of cerebral infarction 01/24/2013   Hx of CABG 07/05/2012   Carotid artery disease (HCC) 07/05/2012   Mitral regurgitation 06/12/2012   Non-STEMI (non-ST elevated myocardial infarction) (HCC) 06/08/2012   Chronic combined systolic and diastolic CHF (congestive heart failure) (HCC) 05/02/2012   AVM (arteriovenous malformation) of small bowel, acquired with hemorrhage 01/20/2012   GERD (gastroesophageal reflux disease) 01/09/2012   Mixed hyperlipidemia 01/05/2012   Atherosclerotic heart disease of native coronary artery without angina pectoris 12/16/2011   Anxiety disorder  05/04/2011   End-stage renal disease on hemodialysis (HCC) 04/29/2011   Gout 04/29/2011   Home Medication(s) Prior to Admission medications   Medication Sig Start Date End Date Taking? Authorizing Provider  ALPRAZolam Prudy Feeler) 0.5 MG tablet Take 1 tablet (0.5 mg total) by mouth daily as needed for up to 4 doses for anxiety. 12/19/22   Lanae Boast, MD  amiodarone (PACERONE) 100 MG tablet Take 1 tablet (100 mg total) by mouth daily. 02/02/23 05/02/23  Sherryll Burger, Pratik D, DO  benzonatate (TESSALON) 100 MG capsule Take 1 capsule (100 mg total) by mouth 3 (three) times daily as needed for cough. 01/18/23   Tilden Fossa, MD  calcitRIOL (ROCALTROL) 0.25 MCG capsule Take 7 capsules (1.75 mcg total) by mouth every Monday, Wednesday, and Friday with hemodialysis. 12/21/22   Lanae Boast, MD  cinacalcet (SENSIPAR) 30 MG tablet Take 1 tablet (30 mg total) by mouth every Monday, Wednesday, and Friday with hemodialysis. 12/19/22   Lanae Boast, MD  cyanocobalamin (VITAMIN B12) 1000 MCG/ML injection Inject 1,000 mcg into the muscle every 30 (thirty) days.    [provider]  folic acid (FOLVITE) 1 MG tablet  Take 1 mg by mouth daily. 05/24/22   [provider]  gabapentin (NEURONTIN) 100 MG capsule Take 1 capsule (100 mg total) by mouth every evening. 12/13/22   Jerald Kief, MD  hydrALAZINE (APRESOLINE) 25 MG tablet Take 1 tablet (25 mg total) by mouth 2 (two) times daily. Hold for sbp <110 12/27/22   Lanae Boast, MD  hydrOXYzine (ATARAX) 25 MG tablet Take 25 mg by mouth every 6 (six) hours as needed for itching. 06/06/22   [provider]  levETIRAcetam (KEPPRA) 500 MG tablet Take 1 tablet (500 mg total) by mouth daily. 11/15/22   Leroy Sea, MD  levothyroxine (SYNTHROID) 75 MCG tablet Take 1 tablet (75 mcg total) by mouth daily at 6 (six) AM. 02/02/23   Sherryll Burger, Pratik D, DO  meclizine (ANTIVERT) 25 MG tablet Take 25 mg by mouth every 4 (four) hours as needed for dizziness. 04/19/23   [provider]  metoprolol tartrate (LOPRESSOR) 25 MG tablet Take 0.5 tablets (12.5 mg total) by mouth 2 (two) times daily. 02/01/23 05/02/23  Sherryll Burger, Pratik D, DO  multivitamin (RENA-VIT) TABS tablet Take 1 tablet by mouth daily.    [provider]  nitroGLYCERIN (NITROSTAT) 0.4 MG SL tablet Place 0.4 mg under the tongue every 5 (five) minutes as needed for chest pain.    [provider]  octreotide (SANDOSTATIN LAR) 20 MG injection Inject 20 mg into the muscle every 28 (twenty-eight) days. 11/15/22   Leroy Sea, MD  olmesartan (BENICAR) 20 MG tablet Take 20 mg by mouth daily. 01/14/23   [provider]  omeprazole (PRILOSEC) 20 MG capsule Take 1 capsule (20 mg total) by mouth 2 (two) times daily before a meal. Patient taking differently: Take 20 mg by mouth as needed (acid reflux). 02/01/23 05/02/23  Sherryll Burger, Pratik D, DO  ondansetron (ZOFRAN) 4 MG tablet Take 4 mg by mouth every 8 (eight) hours as needed. 02/27/23   [provider]  rosuvastatin (CRESTOR) 10 MG tablet Take 1 tablet by mouth once daily 06/06/22   Iran Ouch Lennart Pall, PA-C                                                                                                                                    Past Surgical History Past Surgical History:  Procedure Laterality Date   A/V FISTULAGRAM Left 10/04/2022   Procedure: A/V Fistulagram;  Surgeon: Renford Dills, MD;  Location: [facility] INVASIVE CV LAB;  Service: Cardiovascular;  Laterality: Left;   A/V SHUNTOGRAM Left 03/19/2019   Procedure: A/V SHUNTOGRAM;  Surgeon: Renford Dills, MD;  Location: [facility] INVASIVE CV LAB;  Service: Cardiovascular;  Laterality: Left;   ABDOMINAL HYSTERECTOMY  1992   APPENDECTOMY  06/1990   AV FISTULA PLACEMENT  07/2009   left upper arm   AV FISTULA PLACEMENT Right 09/06/2016   Procedure: RIGHT FOREARM ARTERIOVENOUS (AV) GRAFT;  Surgeon: Sherren Kerns,  MD;  Location: [facility] OR;  Service: Vascular;  Laterality: Right;    AV FISTULA PLACEMENT N/A 02/24/2017   Procedure: INSERTION OF ARTERIOVENOUS (AV) GORE-TEX GRAFT ARM (BRACHIAL AXILLARY);  Surgeon: Renford Dills, MD;  Location: [facility] ORS;  Service: Vascular;  Laterality: N/A;   AVGG REMOVAL Right 09/06/2016   Procedure: REMOVAL OF Right Arm ARTERIOVENOUS GORETEX GRAFT and Vein Patch angioplasty of brachial artery;  Surgeon: Chuck Hint, MD;  Location: [facility] OR;  Service: Vascular;  Laterality: Right;   BIOPSY  09/26/2019   Procedure: BIOPSY;  Surgeon: Malissa Hippo, MD;  [facility]O [facility];  Service: Endoscopy;;   BIOPSY  11/01/2022   Procedure: BIOPSY;  Surgeon: Dolores Frame, MD;  [facility];  Service: Gastroenterology;;   COLON RESECTION  1992   COLON SURGERY     COLONOSCOPY N/A 03/09/2019   Procedure: COLONOSCOPY;  Surgeon: Malissa Hippo, MD;  [facility];  Service: Endoscopy;  Laterality: N/A;   COLONOSCOPY N/A 08/11/2022   Procedure: COLONOSCOPY;  Surgeon: Beverley Fiedler, MD;  Location: [facility] ENDOSCOPY;  Service: Gastroenterology;  Laterality: N/A;   COLONOSCOPY WITH PROPOFOL N/A 06/08/2021   Procedure: COLONOSCOPY WITH PROPOFOL;  Surgeon: Dolores Frame, MD;  [facility];  Service: Gastroenterology;  Laterality: N/A;  9:05 /Patient is on dialysis Mon Wed Fri   CORONARY ANGIOPLASTY WITH STENT PLACEMENT  12/15/11   "2"   CORONARY ANGIOPLASTY WITH STENT PLACEMENT  y/2013   "1; makes total of 3" (05/02/2012)   CORONARY ARTERY BYPASS GRAFT  06/13/2012   Procedure: CORONARY ARTERY BYPASS GRAFTING (CABG);  Surgeon: Delight Ovens, MD;  Location: [facility] OR;  Service: Open Heart Surgery;  Laterality: N/A;  cabg x four;  using left internal mammary artery, and left leg greater saphenous vein harvested endoscopically   CORONARY STENT INTERVENTION N/A 10/13/2016   Procedure: Coronary Stent Intervention;  Surgeon: Lennette Bihari, MD;  [facility]: [facility] INVASIVE CV LAB;  Service: Cardiovascular;   Laterality: N/A;   DIALYSIS/PERMA CATHETER REMOVAL N/A 04/18/2017   Procedure: DIALYSIS/PERMA CATHETER REMOVAL;  Surgeon: Renford Dills, MD;  Location: [facility] INVASIVE CV LAB;  Service: Cardiovascular;  Laterality: N/A;   DILATION AND CURETTAGE OF UTERUS     ENTEROSCOPY N/A 06/08/2021   Procedure: PUSH ENTEROSCOPY;  Surgeon: Dolores Frame, MD;  [facility];  Service: Gastroenterology;  Laterality: N/A;   ENTEROSCOPY N/A 11/01/2022   Procedure: ENTEROSCOPY;  Surgeon: Dolores Frame, MD;  [facility];  Service: Gastroenterology;  Laterality: N/A;   ENTEROSCOPY N/A 12/01/2022   Procedure: ENTEROSCOPY;  Surgeon: Benancio Deeds, MD;  Location: [facility] ENDOSCOPY;  Service: Gastroenterology;  Laterality: N/A;   ESOPHAGOGASTRODUODENOSCOPY  01/20/2012   Procedure: ESOPHAGOGASTRODUODENOSCOPY (EGD);  Surgeon: Meryl Dare, MD,FACG;  Location: [facility] ENDOSCOPY;  Service: Endoscopy;  Laterality: N/A;   ESOPHAGOGASTRODUODENOSCOPY N/A 03/26/2013   Procedure: ESOPHAGOGASTRODUODENOSCOPY (EGD);  Surgeon: Hilarie Fredrickson, MD;  Location: [facility] ENDOSCOPY;  Service: Endoscopy;  Laterality: N/A;   ESOPHAGOGASTRODUODENOSCOPY N/A 04/30/2015   Procedure: ESOPHAGOGASTRODUODENOSCOPY (EGD);  Surgeon: Malissa Hippo, MD;  [facility];  Service: Endoscopy;  Laterality: N/A;  1pm - moved to 10/20 @ 1:10   ESOPHAGOGASTRODUODENOSCOPY N/A 07/29/2016   Procedure: ESOPHAGOGASTRODUODENOSCOPY (EGD);  Surgeon: Ruffin Frederick, MD;  Location: [facility] ENDOSCOPY;  Service: Gastroenterology;  Laterality: N/A;  enteroscopy   ESOPHAGOGASTRODUODENOSCOPY N/A 09/26/2019   Procedure: ESOPHAGOGASTRODUODENOSCOPY (EGD);  Surgeon: Malissa Hippo, MD;  [facility];  Service: Endoscopy;  Laterality: N/A;  1250   ESOPHAGOGASTRODUODENOSCOPY N/A 08/11/2022   Procedure: ESOPHAGOGASTRODUODENOSCOPY (EGD);  Surgeon: Beverley Fiedler, MD;  Location: [facility] ENDOSCOPY;  Service: Gastroenterology;   Laterality: N/A;   ESOPHAGOGASTRODUODENOSCOPY (EGD) WITH PROPOFOL N/A 02/05/2021   Procedure: ESOPHAGOGASTRODUODENOSCOPY (EGD) WITH PROPOFOL;  Surgeon: Lanelle Bal, DO;  [facility];  Service: Endoscopy;  Laterality: N/A;   ESOPHAGOGASTRODUODENOSCOPY (EGD) WITH PROPOFOL N/A 08/27/2022   Procedure: ESOPHAGOGASTRODUODENOSCOPY (EGD) WITH PROPOFOL;  Surgeon: Lanelle Bal, DO;  [facility];  Service: Endoscopy;  Laterality: N/A;   GIVENS CAPSULE STUDY N/A 03/07/2019   Procedure: GIVENS CAPSULE STUDY;  Surgeon: Malissa Hippo, MD;  [facility];  Service: Endoscopy;  Laterality: N/A;  7:30   GIVENS CAPSULE STUDY N/A 04/22/2021   Procedure: GIVENS CAPSULE STUDY;  Surgeon: Malissa Hippo, MD;  [facility];  Service: Endoscopy;  Laterality: N/A;  7:30   GIVENS CAPSULE STUDY N/A 08/27/2022   Procedure: GIVENS CAPSULE STUDY;  Surgeon: Lanelle Bal, DO;  [facility];  Service: Endoscopy;  Laterality: N/A;   HOT HEMOSTASIS N/A 08/11/2022   Procedure: HOT HEMOSTASIS (ARGON PLASMA COAGULATION/BICAP);  Surgeon: Beverley Fiedler, MD;  Location: [facility] ENDOSCOPY;  Service: Gastroenterology;  Laterality: N/A;   HOT HEMOSTASIS N/A 12/01/2022   Procedure: HOT HEMOSTASIS (ARGON PLASMA COAGULATION/BICAP);  Surgeon: Benancio Deeds, MD;  Location: [facility] ENDOSCOPY;  Service: Gastroenterology;  Laterality: N/A;   INSERTION OF DIALYSIS CATHETER N/A 10/05/2020   Procedure: ABORTED TUNNELED DIALYSIS CATHETER PLACEMENT RIGHT INTERNAL JUGULAR VEIN ;  Surgeon: Lucretia Roers, MD;  Location: [facility] ORS;  Service: General;  Laterality: N/A;   INTRAOPERATIVE TRANSESOPHAGEAL ECHOCARDIOGRAM  06/13/2012   Procedure: INTRAOPERATIVE TRANSESOPHAGEAL ECHOCARDIOGRAM;  Surgeon: Delight Ovens, MD;  Location: [facility] OR;  Service: Open Heart Surgery;  Laterality: N/A;   IR DIALY SHUNT INTRO NEEDLE/INTRACATH INITIAL W/IMG LEFT Left 10/06/2020   IR FLUORO GUIDE CV LINE RIGHT   06/17/2020   IR FLUORO GUIDE CV LINE RIGHT  11/12/2022   IR GENERIC HISTORICAL  07/26/2016   IR FLUORO GUIDE CV LINE RIGHT 07/26/2016 Berdine Dance, MD [facility]   IR GENERIC HISTORICAL  07/26/2016   IR US GUIDE VASC ACCESS RIGHT 07/26/2016 Berdine Dance, MD [facility]   IR GENERIC HISTORICAL  08/02/2016   IR US GUIDE VASC ACCESS RIGHT 08/02/2016 Berdine Dance, MD [facility]   IR GENERIC HISTORICAL  08/02/2016   IR FLUORO GUIDE CV LINE RIGHT 08/02/2016 Berdine Dance, MD [facility]   IR RADIOLOGY PERIPHERAL GUIDED IV START  03/28/2017   IR REMOVAL TUN CV CATH W/O FL  08/11/2020   IR REMOVAL TUN CV CATH W/O FL  11/15/2022   IR THROMBECTOMY AV FISTULA W/THROMBOLYSIS INC/SHUNT/IMG LEFT Left 06/17/2020   IR US GUIDE VASC ACCESS LEFT  06/17/2020   IR US GUIDE VASC ACCESS RIGHT  03/28/2017   IR US GUIDE VASC ACCESS RIGHT  06/17/2020   IR US GUIDE VASC ACCESS RIGHT  11/12/2022   LEFT HEART CATH AND CORONARY ANGIOGRAPHY N/A 09/20/2016   Procedure: Left Heart Cath and Coronary Angiography;  Surgeon: Lyn Records, MD;  Location: [facility] INVASIVE CV LAB;  Service: Cardiovascular;  Laterality: N/A;   LEFT HEART CATH AND CORS/GRAFTS ANGIOGRAPHY N/A 10/13/2016   Procedure: Left Heart Cath and Cors/Grafts Angiography;  Surgeon: Lennette Bihari, MD;  [facility]: [facility] INVASIVE CV LAB;  Service: Cardiovascular;  Laterality: N/A;   LEFT HEART  CATH AND CORS/GRAFTS ANGIOGRAPHY N/A 07/13/2018   Procedure: LEFT HEART CATH AND CORS/GRAFTS ANGIOGRAPHY;  Surgeon: Swaziland, Peter M, MD;  Location: [facility] INVASIVE CV LAB;  Service: Cardiovascular;  Laterality: N/A;   LEFT HEART CATH AND CORS/GRAFTS ANGIOGRAPHY N/A 07/22/2021   Procedure: LEFT HEART CATH AND CORS/GRAFTS ANGIOGRAPHY;  Surgeon: Runell Gess, MD;  Location: [facility] INVASIVE CV LAB;  Service: Cardiovascular;  Laterality: N/A;   LEFT HEART CATHETERIZATION WITH CORONARY ANGIOGRAM N/A 12/15/2011   Procedure: LEFT HEART CATHETERIZATION WITH CORONARY ANGIOGRAM;  Surgeon: Kathleene Hazel, MD;  Location: [facility] CATH LAB;  Service: Cardiovascular;  Laterality: N/A;   LEFT HEART CATHETERIZATION WITH CORONARY ANGIOGRAM N/A 01/10/2012   Procedure: LEFT HEART CATHETERIZATION WITH CORONARY ANGIOGRAM;  Surgeon: Peter M Swaziland, MD;  Location: [facility] CATH LAB;  Service: Cardiovascular;  Laterality: N/A;   LEFT HEART CATHETERIZATION WITH CORONARY ANGIOGRAM N/A 06/08/2012   Procedure: LEFT HEART CATHETERIZATION WITH CORONARY ANGIOGRAM;  Surgeon: Kathleene Hazel, MD;  Location: [facility] CATH LAB;  Service: Cardiovascular;  Laterality: N/A;   LEFT HEART CATHETERIZATION WITH CORONARY/GRAFT ANGIOGRAM N/A 12/10/2013   Procedure: LEFT HEART CATHETERIZATION WITH Isabel Caprice;  Surgeon: Corky Crafts, MD;  Location: [facility] CATH LAB;  Service: Cardiovascular;  Laterality: N/A;   OVARY SURGERY     ovarian cancer   POLYPECTOMY  03/09/2019   Procedure: POLYPECTOMY;  Surgeon: Malissa Hippo, MD;  [facility];  Service: Endoscopy;;  cecal    POLYPECTOMY N/A 09/26/2019   Procedure: DUODENAL POLYPECTOMY;  Surgeon: Malissa Hippo, MD;  [facility];  Service: Endoscopy;  Laterality: N/A;   POLYPECTOMY  08/11/2022   Procedure: POLYPECTOMY;  Surgeon: Beverley Fiedler, MD;  Location: [facility] ENDOSCOPY;  Service: Gastroenterology;;   REVISION OF ARTERIOVENOUS GORETEX GRAFT N/A 02/24/2017   Procedure: REVISION OF ARTERIOVENOUS GORETEX GRAFT (RESECTION);  Surgeon: Renford Dills, MD;  Location: [facility] ORS;  Service: Vascular;  Laterality: N/A;   REVISON OF ARTERIOVENOUS FISTULA Left 06/19/2020   Procedure: REVISION OF LEFT UPPER ARM AV GRAFT WITH INTERPOSITION JUMP GRAFT USING GORE LIMB;  Surgeon: Cephus Shelling, MD;  Location: [facility] OR;  Service: Vascular;  Laterality: Left;   SHUNTOGRAM N/A 10/15/2013   Procedure: Fistulogram;  Surgeon: Nada Libman, MD;  Location: [facility] CATH LAB;  Service: Cardiovascular;  Laterality: N/A;   SUBMUCOSAL TATTOO INJECTION  11/01/2022   Procedure:  SUBMUCOSAL TATTOO INJECTION;  Surgeon: Dolores Frame, MD;  [facility];  Service: Gastroenterology;;   THROMBECTOMY / ARTERIOVENOUS GRAFT REVISION  2011   left upper arm   TUBAL LIGATION  1980's   UPPER EXTREMITY ANGIOGRAPHY Bilateral 12/06/2016   Procedure: Upper Extremity Angiography;  Surgeon: Renford Dills, MD;  Location: [facility] INVASIVE CV LAB;  Service: Cardiovascular;  Laterality: Bilateral;   UPPER EXTREMITY INTERVENTION Left 06/06/2017   Procedure: UPPER EXTREMITY INTERVENTION;  Surgeon: Renford Dills, MD;  Location: [facility] INVASIVE CV LAB;  Service: Cardiovascular;  Laterality: Left;   UPPER EXTREMITY VENOGRAPHY Left 10/04/2022   Procedure: UPPER EXTREMITY VENOGRAPHY;  Surgeon: Renford Dills, MD;  Location: [facility] INVASIVE CV LAB;  Service: Cardiovascular;  Laterality: Left;   Family History Family History  Problem Relation Age of Onset   Heart disease Mother        Heart Disease before age 47   Hyperlipidemia Mother    Hypertension Mother    Diabetes Mother    Heart attack Mother    Heart disease Father  Heart Disease before age 83   Hyperlipidemia Father    Hypertension Father    Diabetes Father    Diabetes Sister    Hypertension Sister    Diabetes Brother    Hyperlipidemia Brother    Heart attack Brother    Hypertension Sister    Heart attack Brother    Colon cancer Child 51   Other Other        noncontributory for early CAD   Esophageal cancer Neg Hx    Liver disease Neg Hx    Kidney disease Neg Hx    Colon polyps Neg Hx     Social History Social History   Tobacco Use   Smoking status: Never   Smokeless tobacco: Never  Vaping Use   Vaping status: Never Used  Substance Use Topics   Alcohol use: No    Alcohol/week: 0.0 standard drinks of alcohol   Drug use: No   Allergies Amlodipine, Aspirin, Nitrofurantoin, Ranexa [ranolazine], Bactrim [sulfamethoxazole-trimethoprim], Iodinated contrast media, Iron, Tylenol  [acetaminophen], Gabapentin, Sucroferric oxyhydroxide, Levaquin [levofloxacin in d5w], Levofloxacin, Plavix [clopidogrel bisulfate], Protonix [pantoprazole sodium], and Venofer [ferric oxide]  Review of Systems Review of Systems  All other systems reviewed and are negative.   Physical Exam Vital Signs  I have reviewed the triage vital signs BP (!) 186/68   Pulse 62   Temp (!) 97.5 F (36.4 C)   Resp 19   Ht 5\' 2"  (1.575 m)   Wt 53.7 kg   SpO2 95%   BMI 21.65 kg/m  Physical Exam Vitals and nursing note reviewed.  Constitutional:      General: She is not in acute distress.    Appearance: She is well-developed.  HENT:     Head: Normocephalic and atraumatic.     Mouth/Throat:     Mouth: Mucous membranes are moist.  Eyes:     Pupils: Pupils are equal, round, and reactive to light.  Neck:     Vascular: JVD present.  Cardiovascular:     Rate and Rhythm: Normal rate and regular rhythm.     Heart sounds: No murmur heard. Pulmonary:     Comments: Not speaking in full sentences, mild increased work of breathing, trace bibasilar crackles Abdominal:     General: Abdomen is flat.     Palpations: Abdomen is soft.     Tenderness: There is no abdominal tenderness.  Musculoskeletal:        General: No tenderness.     Right lower leg: Edema present.     Left lower leg: Edema present.  Skin:    General: Skin is warm and dry.  Neurological:     General: No focal deficit present.     Mental Status: She is alert. Mental status is at baseline.  Psychiatric:        Mood and Affect: Mood normal.        Behavior: Behavior normal.     ED Results and Treatments Labs (all labs ordered are listed, but only abnormal results are displayed) Labs Reviewed  COMPREHENSIVE METABOLIC PANEL - Abnormal; Notable for the following components:      Result Value   Potassium 3.1 (*)    Creatinine, Ser 5.43 (*)    Calcium 8.4 (*)    Total Protein 5.3 (*)    Albumin 1.9 (*)    GFR, Estimated 7  (*)    All other components within normal limits  CBC WITH DIFFERENTIAL/PLATELET - Abnormal; Notable for the following components:  RBC 2.42 (*)    Hemoglobin 8.0 (*)    HCT 25.0 (*)    MCV 103.3 (*)    RDW 27.9 (*)    Lymphs Abs 0.5 (*)    All other components within normal limits  BRAIN NATRIURETIC PEPTIDE - Abnormal; Notable for the following components:   B Natriuretic Peptide 3,351.0 (*)    All other components within normal limits                                                                                                                          Radiology DG Chest Port 1 View  Result Date: 05/13/2023 CLINICAL DATA:  Shortness of breath. Peripheral edema. End-stage renal disease on dialysis. EXAM: PORTABLE CHEST 1 VIEW COMPARISON:  05/10/2023 FINDINGS: Stable cardiomegaly. Left-sided dual-lumen central venous dialysis catheter remains in appropriate position. Prior CABG again noted. Increased diffuse interstitial infiltrates, consistent with interstitial edema. Small bilateral pleural effusions and bibasilar atelectasis, without significant change. IMPRESSION: Increased diffuse interstitial edema. Stable small bilateral pleural effusions and bibasilar atelectasis. Stable cardiomegaly. Electronically Signed   By: Danae Orleans M.D.   On: 05/13/2023 14:50    Pertinent labs & imaging results that were available during my care of the patient were reviewed by me and considered in my medical decision making (see MDM for details).  Medications Ordered in ED Medications - No data to display                                                                                                                                   Procedures Procedures  (including critical care time)  Medical Decision Making / ED Course   MDM:  82 year old presenting with weakness.  Suspect some of this is due to her missed dialysis.  She only received 2 dialysis sessions this week.  She does appear volume  overloaded.  She does have mild respiratory symptoms.  Believe patient will need dialysis.  Chest x-ray shows signs of volume overload.  Will obtain labs to evaluate for any metabolic issue.  Will check EKG.  No fevers to suggest any underlying infectious process.  No cough.  Signed out to oncoming provider pending labs, likely dialysis.      Additional history obtained: -Additional history obtained from spouse -External records from outside source obtained and reviewed including: Chart review including previous notes, labs, imaging, consultation notes including previous ER visit  Lab Tests: -I ordered, reviewed, and interpreted labs.   The pertinent results include:   Labs Reviewed  COMPREHENSIVE METABOLIC PANEL - Abnormal; Notable for the following components:      Result Value   Potassium 3.1 (*)    Creatinine, Ser 5.43 (*)    Calcium 8.4 (*)    Total Protein 5.3 (*)    Albumin 1.9 (*)    GFR, Estimated 7 (*)    All other components within normal limits  CBC WITH DIFFERENTIAL/PLATELET - Abnormal; Notable for the following components:   RBC 2.42 (*)    Hemoglobin 8.0 (*)    HCT 25.0 (*)    MCV 103.3 (*)    RDW 27.9 (*)    Lymphs Abs 0.5 (*)    All other components within normal limits  BRAIN NATRIURETIC PEPTIDE - Abnormal; Notable for the following components:   B Natriuretic Peptide 3,351.0 (*)    All other components within normal limits    Notable for elevated BNP    Imaging Studies ordered: I ordered imaging studies including CXR On my interpretation imaging demonstrates pulmonary edema  I independently visualized and interpreted imaging. I agree with the radiologist interpretation   Medicines ordered and prescription drug management: No orders of the defined types were placed in this encounter.   -I have reviewed the patients home medicines and have made adjustments as needed  Co morbidities that complicate the patient evaluation  Past Medical History:   Diagnosis Date   Acute on chronic respiratory failure with hypoxia (HCC) 10/10/2016   Anxiety    Arthritis    AVM (arteriovenous malformation) of colon    CAD (coronary artery disease)    a. s/p CABG in 2013 b. DES to D1 in 10/2016. c. cath in 07/2018 showing patent grafts with occlusion of D1 at prior stent site and progression of PDA disease --> medical management recommended   Carotid artery disease (HCC)    a. 60-79% LICA, 03/2012    Chronic bronchitis (HCC)    Chronic HFrEF (heart failure with reduced ejection fraction) (HCC)    Colon cancer (HCC) 1992   Esophageal stricture    ESRD on hemodialysis (HCC)    ESRD due to HTN, started dialysis 2011 and gets HD at [facility] with Dr Fausto Skillern on MWF schedule.  Access is LUA AVF as of Sept 2014.    GERD (gastroesophageal reflux disease)    High cholesterol 12/2011   History of blood transfusion 07/2011; 12/2011; 01/2012 X 2; 04/2012   History of gout    History of lower GI bleeding    Hypertension    Iron deficiency anemia    Jugular vein occlusion, right (HCC)    Mitral regurgitation    a. Moderate by echo, 02/2012   Mitral valve disease    NSVT (nonsustained ventricular tachycardia) (HCC)    Ovarian cancer (HCC) 1992   PAF (paroxysmal atrial fibrillation) (HCC)    Pneumonia ~ 2009   PUD (peptic ulcer disease)    TIA (transient ischemic attack)    Tricuspid valve disease       Dispostion: Disposition decision including need for hospitalization was considered, and patient disposition pending at time of sign out.    Final Clinical Impression(s) / ED Diagnoses Final diagnoses:  Hypervolemia associated with renal insufficiency     This chart was dictated using voice recognition software.  Despite best efforts to proofread,  errors can occur which can change the documentation meaning.    Ennifer Harston,  Jerilee Field, MD 05/13/23 [phone]

## 2023-05-13 NOTE — ED Triage Notes (Signed)
Pt reports she got off schedule with her dialysis.  States she is usually Monday, Wednesday, Friday but missed a day so she went Thursday but feels like she needs it now because she has swelling to her face, arms and legs.

## 2023-05-13 NOTE — ED Notes (Signed)
Per previous shift RN, pt to be dialysized at 2100 tonight.

## 2023-05-13 NOTE — ED Notes (Addendum)
Pt states has only had dialysis twice this week. Last full dialysis this past Thursday 05/11/23. States started swelling yesterday and worse today. Pt swelling noted to arms/hands/legs and face. Swelling under eyes noted which is not her normal as pt comes to ED often. Pt is a/o. Nad. Pt denies any complaints other than needing her dialysis because she is swelling. Husband at bedside. States they called [facility] this morning and they stated they were short staffed and could not get her in today

## 2023-05-13 NOTE — Progress Notes (Addendum)
Called by ED for possible need for dialysis for this patient. She presented w/ weakness. Missed her Friday HD yesterday. Also is c/o SOB, leg swelling. She tried to call the HD center today but they had no room for her.  CXR showed new IS edema. Pt on RA. Not in distress but vol overloaded. BP's high 201/88. K+ 3.1, creat 5.43. Have written orders (ED HD) and our HD nurse is aware. There is another patient already being dialyzed and so not sure if Ms Grisanti will get dialysis tonight or in the morning tomorrow. Have d/w ED MD. Plan will be ED HD, but they will adjust to OBS admit if pt can't be dialyzed til tomorrow am.   MWF at [facility] EDW 55kg (from 10/22)   TDC  2/2.5 bath  300/5000   heparin none     Vinson Moselle  MD  [facility] 05/13/2023, 5:22 PM  Recent Labs  Lab 05/10/23 1410 05/13/23 1500  HGB 8.3* 8.0*  ALBUMIN 1.8* 1.9*  CALCIUM 8.2* 8.4*  CREATININE 5.46* 5.43*  K 3.3* 3.1*    Inpatient medications:

## 2023-05-13 NOTE — ED Provider Notes (Signed)
I was asked to follow-up on the patient's labs and x-ray at signout.  In brief, this is an 82 year old female with past medical history of end-stage renal disease.  She is coming today with generalized weakness and some dyspnea on exertion.  She has missed dialysis.  Last dialysis was Thursday.  She normally has dialysis Mondays, Wednesdays, and Fridays.  She did not go to her scheduled dialysis yesterday. Physical Exam  BP (!) 175/60   Pulse (!) 59   Temp (!) 97.5 F (36.4 C)   Resp 19   Ht 5\' 2"  (1.575 m)   Wt 53.7 kg   SpO2 93%   BMI 21.65 kg/m   Physical Exam  Procedures  Procedures  ED Course / MDM    Medical Decision Making Amount and/or Complexity of Data Reviewed Labs: ordered. Radiology: ordered.   The patient's labs are largely unremarkable.  Her potassium is low at 3.1.  Will hold off on treating this as the patient is an end-stage renal patient.  Her chest x-ray does show some small pleural effusions and vascular congestion.  A call was placed to nephrology to see if dialysis can be arranged today or if she will need observation admission.  Her oxygen saturations are 93% on room air and she does have some conversational dyspnea so she will likely need to have dialysis before she will be able to safely go home.  I did speak with nephrology.  They are going to try to dialyze the patient tonight but did state that there is a possibility that it may be tomorrow morning before this can be done.  I will keep the patient in the emergency department in the hopes that she may be dialyzed and discharged. 17:18       Durwin Glaze, MD 05/14/23 2244

## 2023-05-14 MED ORDER — HEPARIN SODIUM (PORCINE) 1000 UNIT/ML IJ SOLN
INTRAMUSCULAR | Status: AC
Start: 2023-05-14 — End: ?
  Filled 2023-05-14: qty 4

## 2023-05-14 NOTE — ED Provider Notes (Signed)
Returns from this.  She appears well.  Vital signs unremarkable.  Patient without complaints.  Appropriate for discharge.   Shawna Crease, MD 05/14/23 [phone]

## 2023-05-14 NOTE — ED Notes (Signed)
Pt returned from dialysis, per RN, 1.8 L removed. VSS

## 2023-05-14 NOTE — Progress Notes (Signed)
   HEMODIALYSIS TREATMENT NOTE:  3 hour heparin-free treatment completed using left internal jugular TDC. Cath dressing changed; exit site is unremarkable. Goal was adjusted for rapidly down-trending blood pressures.  All blood was returned. Net UF 1.8 liters.   Post-HD:  05/14/23 0030  Vitals  Temp 98 F (36.7 C)  Temp Source Oral  BP (!) 153/67  MAP (mmHg) 97  BP Location Right Arm  BP Method Automatic  Patient Position (if appropriate) Sitting  Pulse Rate 69  Pulse Rate Source Monitor  ECG Heart Rate 68  Resp 18  Oxygen Therapy  SpO2 100 %  O2 Device Room Air  Post Treatment  Dialyzer Clearance Lightly streaked  Hemodialysis Intake (mL) 0 mL  Liters Processed 63.2  Fluid Removed (mL) 1800 mL  Tolerated HD Treatment Yes  Post-Hemodialysis Comments New goal met  Hemodialysis Catheter Left Subclavian Double lumen Temporary (Non-Tunneled)  Placement Date: 06/09/22   Placed prior to admission: Yes  Orientation: Left  Access Location: Subclavian  Hemodialysis Catheter Type: Double lumen Temporary (Non-Tunneled)  Site Condition No complications  Blue Lumen Status Flushed;Heparin locked;Dead end cap in place  Red Lumen Status Flushed;Heparin locked;Dead end cap in place  Purple Lumen Status N/A  Catheter fill solution Heparin 1000 units/ml  Catheter fill volume (Arterial) 1.9 cc  Catheter fill volume (Venous) 1.9  Dressing Type Transparent;Tube stabilization device  Dressing Status Antimicrobial disc in place;Clean, Dry, Intact  Interventions New dressing  Drainage Description None  Dressing Change Due 05/20/23  Post treatment catheter status Capped and Clamped    Arman Filter, RN AP [facility]

## 2023-05-22 NOTE — Telephone Encounter (Signed)
Eror

## 2023-05-23 ENCOUNTER — Telehealth (INDEPENDENT_AMBULATORY_CARE_PROVIDER_SITE_OTHER): Payer: Self-pay

## 2023-05-23 NOTE — Telephone Encounter (Signed)
                -------  Fax Transmission Report-------  To:               Recipient at [phone] Subject:          patient assistance for 2025 Sandostatin R. [name] Result:           The transmission was successful. Explanation:      All Pages Ok Pages Sent:       10 Connect Time:     5 minutes, 55 seconds Transmit Time:    05/23/2023 14:59 Transfer Rate:    14400 Status Code:      0000 Retry Count:      0 Job Id:           [id number] Unique Id:        [id number] Fax Line:         50 Fax Server:       [id number]   I received patient re enrollment forms from [employer]/ Sandostatin on this patient for the year 2025. I spoke with Dawn( pharmacist) at [facility] phone # [phone] and she says for [place] to fax her the forms to [phone] and they will fill out the forms since they are the ones administering the Sandostatin. I have faxed this to their attention.

## 2023-05-23 NOTE — Telephone Encounter (Signed)
Thanks

## 2023-05-31 ENCOUNTER — Encounter (HOSPITAL_COMMUNITY): Payer: Self-pay

## 2023-05-31 ENCOUNTER — Emergency Department (HOSPITAL_COMMUNITY): Payer: Medicare Other

## 2023-05-31 ENCOUNTER — Inpatient Hospital Stay (HOSPITAL_COMMUNITY)
Admission: EM | Admit: 2023-05-31 | Discharge: 2023-06-05 | DRG: 291 | Disposition: A | Discharge status: Skilled Nursing Facility | Payer: Medicare Other | Attending: Internal Medicine | Admitting: Internal Medicine

## 2023-05-31 ENCOUNTER — Other Ambulatory Visit: Payer: Self-pay

## 2023-05-31 DIAGNOSIS — Z800 Family history of malignant neoplasm of digestive organs: Secondary | ICD-10-CM

## 2023-05-31 DIAGNOSIS — F419 Anxiety disorder, unspecified: Secondary | ICD-10-CM | POA: Diagnosis present

## 2023-05-31 DIAGNOSIS — D649 Anemia, unspecified: Secondary | ICD-10-CM | POA: Diagnosis not present

## 2023-05-31 DIAGNOSIS — Z955 Presence of coronary angioplasty implant and graft: Secondary | ICD-10-CM

## 2023-05-31 DIAGNOSIS — E7800 Pure hypercholesterolemia, unspecified: Secondary | ICD-10-CM | POA: Diagnosis present

## 2023-05-31 DIAGNOSIS — Z79899 Other long term (current) drug therapy: Secondary | ICD-10-CM

## 2023-05-31 DIAGNOSIS — K5520 Angiodysplasia of colon without hemorrhage: Secondary | ICD-10-CM | POA: Diagnosis present

## 2023-05-31 DIAGNOSIS — Z882 Allergy status to sulfonamides status: Secondary | ICD-10-CM

## 2023-05-31 DIAGNOSIS — I4719 Other supraventricular tachycardia: Secondary | ICD-10-CM | POA: Diagnosis present

## 2023-05-31 DIAGNOSIS — Z1152 Encounter for screening for COVID-19: Secondary | ICD-10-CM

## 2023-05-31 DIAGNOSIS — D500 Iron deficiency anemia secondary to blood loss (chronic): Secondary | ICD-10-CM | POA: Diagnosis present

## 2023-05-31 DIAGNOSIS — Z888 Allergy status to other drugs, medicaments and biological substances status: Secondary | ICD-10-CM

## 2023-05-31 DIAGNOSIS — Z91158 Patient's noncompliance with renal dialysis for other reason: Secondary | ICD-10-CM

## 2023-05-31 DIAGNOSIS — I2510 Atherosclerotic heart disease of native coronary artery without angina pectoris: Secondary | ICD-10-CM | POA: Diagnosis present

## 2023-05-31 DIAGNOSIS — I480 Paroxysmal atrial fibrillation: Secondary | ICD-10-CM | POA: Diagnosis present

## 2023-05-31 DIAGNOSIS — D638 Anemia in other chronic diseases classified elsewhere: Secondary | ICD-10-CM | POA: Diagnosis not present

## 2023-05-31 DIAGNOSIS — R531 Weakness: Principal | ICD-10-CM | POA: Diagnosis present

## 2023-05-31 DIAGNOSIS — Z79890 Hormone replacement therapy (postmenopausal): Secondary | ICD-10-CM

## 2023-05-31 DIAGNOSIS — R627 Adult failure to thrive: Secondary | ICD-10-CM | POA: Diagnosis present

## 2023-05-31 DIAGNOSIS — M109 Gout, unspecified: Secondary | ICD-10-CM | POA: Diagnosis present

## 2023-05-31 DIAGNOSIS — Z83438 Family history of other disorder of lipoprotein metabolism and other lipidemia: Secondary | ICD-10-CM

## 2023-05-31 DIAGNOSIS — I4892 Unspecified atrial flutter: Secondary | ICD-10-CM | POA: Diagnosis present

## 2023-05-31 DIAGNOSIS — I081 Rheumatic disorders of both mitral and tricuspid valves: Secondary | ICD-10-CM | POA: Diagnosis present

## 2023-05-31 DIAGNOSIS — E039 Hypothyroidism, unspecified: Secondary | ICD-10-CM | POA: Diagnosis present

## 2023-05-31 DIAGNOSIS — Z992 Dependence on renal dialysis: Secondary | ICD-10-CM

## 2023-05-31 DIAGNOSIS — Z91041 Radiographic dye allergy status: Secondary | ICD-10-CM

## 2023-05-31 DIAGNOSIS — I132 Hypertensive heart and chronic kidney disease with heart failure and with stage 5 chronic kidney disease, or end stage renal disease: Secondary | ICD-10-CM | POA: Diagnosis not present

## 2023-05-31 DIAGNOSIS — D62 Acute posthemorrhagic anemia: Secondary | ICD-10-CM | POA: Diagnosis present

## 2023-05-31 DIAGNOSIS — Z90710 Acquired absence of both cervix and uterus: Secondary | ICD-10-CM

## 2023-05-31 DIAGNOSIS — Z6821 Body mass index (BMI) 21.0-21.9, adult: Secondary | ICD-10-CM

## 2023-05-31 DIAGNOSIS — Z8673 Personal history of transient ischemic attack (TIA), and cerebral infarction without residual deficits: Secondary | ICD-10-CM

## 2023-05-31 DIAGNOSIS — Z833 Family history of diabetes mellitus: Secondary | ICD-10-CM

## 2023-05-31 DIAGNOSIS — D631 Anemia in chronic kidney disease: Secondary | ICD-10-CM | POA: Diagnosis present

## 2023-05-31 DIAGNOSIS — N186 End stage renal disease: Secondary | ICD-10-CM | POA: Diagnosis present

## 2023-05-31 DIAGNOSIS — Z8711 Personal history of peptic ulcer disease: Secondary | ICD-10-CM

## 2023-05-31 DIAGNOSIS — Z886 Allergy status to analgesic agent status: Secondary | ICD-10-CM

## 2023-05-31 DIAGNOSIS — I959 Hypotension, unspecified: Secondary | ICD-10-CM | POA: Diagnosis present

## 2023-05-31 DIAGNOSIS — M898X9 Other specified disorders of bone, unspecified site: Secondary | ICD-10-CM | POA: Diagnosis present

## 2023-05-31 DIAGNOSIS — E1122 Type 2 diabetes mellitus with diabetic chronic kidney disease: Secondary | ICD-10-CM | POA: Diagnosis present

## 2023-05-31 DIAGNOSIS — Z951 Presence of aortocoronary bypass graft: Secondary | ICD-10-CM

## 2023-05-31 DIAGNOSIS — Z85038 Personal history of other malignant neoplasm of large intestine: Secondary | ICD-10-CM

## 2023-05-31 DIAGNOSIS — I5042 Chronic combined systolic (congestive) and diastolic (congestive) heart failure: Secondary | ICD-10-CM | POA: Diagnosis present

## 2023-05-31 DIAGNOSIS — Z8543 Personal history of malignant neoplasm of ovary: Secondary | ICD-10-CM

## 2023-05-31 DIAGNOSIS — Z8249 Family history of ischemic heart disease and other diseases of the circulatory system: Secondary | ICD-10-CM

## 2023-05-31 DIAGNOSIS — K219 Gastro-esophageal reflux disease without esophagitis: Secondary | ICD-10-CM | POA: Diagnosis present

## 2023-05-31 DIAGNOSIS — G40909 Epilepsy, unspecified, not intractable, without status epilepticus: Secondary | ICD-10-CM | POA: Diagnosis present

## 2023-05-31 LAB — CBC
HCT: 20.7 % — ABNORMAL LOW (ref 36.0–46.0)
Hemoglobin: 7 g/dL — ABNORMAL LOW (ref 12.0–15.0)
MCH: 35.4 pg — ABNORMAL HIGH (ref 26.0–34.0)
MCHC: 33.8 g/dL (ref 30.0–36.0)
MCV: 104.5 fL — ABNORMAL HIGH (ref 80.0–100.0)
Platelets: 236 10*3/uL (ref 150–400)
RBC: 1.98 MIL/uL — ABNORMAL LOW (ref 3.87–5.11)
WBC: 4.1 10*3/uL (ref 4.0–10.5)
nRBC: 0 % (ref 0.0–0.2)

## 2023-05-31 LAB — BASIC METABOLIC PANEL
Anion gap: 9 (ref 5–15)
BUN: 13 mg/dL (ref 8–23)
CO2: 25 mmol/L (ref 22–32)
Calcium: 8.1 mg/dL — ABNORMAL LOW (ref 8.9–10.3)
Chloride: 97 mmol/L — ABNORMAL LOW (ref 98–111)
Creatinine, Ser: 4.41 mg/dL — ABNORMAL HIGH (ref 0.44–1.00)
GFR, Estimated: 9 mL/min — ABNORMAL LOW (ref >60)
Glucose, Bld: 175 mg/dL — ABNORMAL HIGH (ref 70–99)
Potassium: 2.8 mmol/L — ABNORMAL LOW (ref 3.5–5.1)
Sodium: 131 mmol/L — ABNORMAL LOW (ref 135–145)

## 2023-05-31 LAB — PREPARE RBC (CROSSMATCH)

## 2023-05-31 LAB — MAGNESIUM: Magnesium: 1.5 mg/dL — ABNORMAL LOW (ref 1.7–2.4)

## 2023-05-31 LAB — RESP PANEL BY RT-PCR (RSV, FLU A&B, COVID)  RVPGX2
Influenza A by PCR: NEGATIVE
Influenza B by PCR: NEGATIVE
Resp Syncytial Virus by PCR: NEGATIVE
SARS Coronavirus 2 by RT PCR: NEGATIVE

## 2023-05-31 LAB — TROPONIN I (HIGH SENSITIVITY): Troponin I (High Sensitivity): 43 ng/L — ABNORMAL HIGH (ref <18)

## 2023-05-31 MED ORDER — HYDRALAZINE HCL 20 MG/ML IJ SOLN: 5.0000 mg | INTRAMUSCULAR | Status: DC | PRN

## 2023-05-31 MED ORDER — SODIUM CHLORIDE 0.9% IV SOLUTION: Freq: Once | INTRAVENOUS | Status: DC

## 2023-05-31 MED ORDER — PANTOPRAZOLE SODIUM 40 MG PO TBEC
40.0000 mg | DELAYED_RELEASE_TABLET | Freq: Every day | ORAL | Status: DC
  Administered 2023-05-31 – 2023-06-05 (×6): 40 mg via ORAL
  Filled 2023-05-31 (×6): qty 1

## 2023-05-31 MED ORDER — ALPRAZOLAM 0.5 MG PO TABS
0.5000 mg | ORAL_TABLET | Freq: Every day | ORAL | Status: DC | PRN
  Administered 2023-06-01: 0.5 mg via ORAL
  Filled 2023-05-31: qty 1

## 2023-05-31 MED ORDER — LEVETIRACETAM 500 MG PO TABS
500.0000 mg | ORAL_TABLET | Freq: Every day | ORAL | Status: DC
  Administered 2023-06-01 – 2023-06-05 (×5): 500 mg via ORAL
  Filled 2023-05-31 (×5): qty 1

## 2023-05-31 MED ORDER — IRBESARTAN 75 MG PO TABS
37.5000 mg | ORAL_TABLET | Freq: Every day | ORAL | Status: DC
  Administered 2023-06-01 – 2023-06-05 (×5): 37.5 mg via ORAL
  Filled 2023-05-31 (×5): qty 1

## 2023-05-31 MED ORDER — AMIODARONE HCL 200 MG PO TABS
100.0000 mg | ORAL_TABLET | Freq: Every day | ORAL | Status: DC
  Administered 2023-06-01 – 2023-06-05 (×5): 100 mg via ORAL
  Filled 2023-05-31 (×5): qty 1

## 2023-05-31 MED ORDER — POTASSIUM CHLORIDE CRYS ER 20 MEQ PO TBCR
40.0000 meq | EXTENDED_RELEASE_TABLET | Freq: Once | ORAL | Status: AC
  Administered 2023-05-31: 40 meq via ORAL
  Filled 2023-05-31: qty 2

## 2023-05-31 MED ORDER — HYDRALAZINE HCL 25 MG PO TABS
25.0000 mg | ORAL_TABLET | Freq: Two times a day (BID) | ORAL | Status: DC
  Administered 2023-05-31 – 2023-06-04 (×8): 25 mg via ORAL
  Filled 2023-05-31 (×10): qty 1

## 2023-05-31 MED ORDER — HYDRALAZINE HCL 25 MG PO TABS
25.0000 mg | ORAL_TABLET | ORAL | Status: AC
  Administered 2023-05-31: 25 mg via ORAL
  Filled 2023-05-31: qty 1

## 2023-05-31 MED ORDER — ALBUTEROL SULFATE (2.5 MG/3ML) 0.083% IN NEBU: 2.5000 mg | INHALATION_SOLUTION | RESPIRATORY_TRACT | Status: DC | PRN

## 2023-05-31 MED ORDER — GABAPENTIN 100 MG PO CAPS
100.0000 mg | ORAL_CAPSULE | Freq: Every evening | ORAL | Status: DC
  Administered 2023-06-01 – 2023-06-04 (×4): 100 mg via ORAL
  Filled 2023-05-31 (×4): qty 1

## 2023-05-31 MED ORDER — FOLIC ACID 1 MG PO TABS
1.0000 mg | ORAL_TABLET | Freq: Every day | ORAL | Status: DC
  Administered 2023-05-31 – 2023-06-05 (×6): 1 mg via ORAL
  Filled 2023-05-31 (×6): qty 1

## 2023-05-31 MED ORDER — MAGNESIUM SULFATE 2 GM/50ML IV SOLN
2.0000 g | Freq: Once | INTRAVENOUS | Status: AC
  Administered 2023-05-31: 2 g via INTRAVENOUS
  Filled 2023-05-31: qty 50

## 2023-05-31 MED ORDER — LEVOTHYROXINE SODIUM 50 MCG PO TABS
50.0000 ug | ORAL_TABLET | Freq: Every day | ORAL | Status: DC
  Administered 2023-06-01 – 2023-06-05 (×5): 50 ug via ORAL
  Filled 2023-05-31 (×5): qty 1

## 2023-05-31 MED ORDER — ROSUVASTATIN CALCIUM 10 MG PO TABS
10.0000 mg | ORAL_TABLET | Freq: Every day | ORAL | Status: DC
  Administered 2023-06-01 – 2023-06-05 (×5): 10 mg via ORAL
  Filled 2023-05-31 (×5): qty 1

## 2023-05-31 NOTE — ED Notes (Signed)
Attempted to call report was told to call back in 10 minutes ?

## 2023-05-31 NOTE — ED Provider Notes (Signed)
[place] EMERGENCY DEPARTMENT AT [facility] Provider Note   CSN: [id number] Arrival date & time: 05/31/23  1158     History  Chief Complaint  Patient presents with   Weakness    Shawna [name] is a 82 y.o. female.  82 year old female with a history of ESRD on Monday, Wednesday, Friday dialysis, CAD status post PCI, heart failure, A-fib not on anticoagulation due to GI bleeds from possible AVM who presents emergency department with generalized weakness.  Patient reports that over the past 2 days to 1 week she has had generalized weakness.  Did go to dialysis Monday but was too weak to go to dialysis today.  Says that she has also had some congestion and cough and shortness of breath.  No blood or melena.  Not currently on anticoagulation.  Says that she sometimes feels like this when she needs a blood transfusion.       Home Medications Prior to Admission medications   Medication Sig Start Date End Date Taking? Authorizing Provider  amiodarone (PACERONE) 200 MG tablet Take 200 mg by mouth daily. 05/31/23  Yes [provider]  nitroGLYCERIN (NITROSTAT) 0.4 MG SL tablet Place 0.4 mg under the tongue every 5 (five) minutes as needed for chest pain.   Yes [provider]  ALPRAZolam Prudy Feeler) 0.5 MG tablet Take 1 tablet (0.5 mg total) by mouth daily as needed for up to 4 doses for anxiety. 12/19/22   Lanae Boast, MD  amiodarone (PACERONE) 100 MG tablet Take 1 tablet (100 mg total) by mouth daily. 02/02/23 05/02/23  Sherryll Burger, Pratik D, DO  benzonatate (TESSALON) 100 MG capsule Take 1 capsule (100 mg total) by mouth 3 (three) times daily as needed for cough. 01/18/23   Tilden Fossa, MD  calcitRIOL (ROCALTROL) 0.25 MCG capsule Take 7 capsules (1.75 mcg total) by mouth every Monday, Wednesday, and Friday with hemodialysis. 12/21/22   Lanae Boast, MD  cinacalcet (SENSIPAR) 30 MG tablet Take 1 tablet (30 mg total) by mouth every Monday, Wednesday, and Friday with  hemodialysis. 12/19/22   Lanae Boast, MD  cyanocobalamin (VITAMIN B12) 1000 MCG/ML injection Inject 1,000 mcg into the muscle every 30 (thirty) days.    [provider]  folic acid (FOLVITE) 1 MG tablet Take 1 mg by mouth daily. 05/24/22   [provider]  gabapentin (NEURONTIN) 100 MG capsule Take 1 capsule (100 mg total) by mouth every evening. 12/13/22   Jerald Kief, MD  hydrALAZINE (APRESOLINE) 25 MG tablet Take 1 tablet (25 mg total) by mouth 2 (two) times daily. Hold for sbp <110 12/27/22   Lanae Boast, MD  hydrOXYzine (ATARAX) 25 MG tablet Take 25 mg by mouth every 6 (six) hours as needed for itching. 06/06/22   [provider]  levETIRAcetam (KEPPRA) 500 MG tablet Take 1 tablet (500 mg total) by mouth daily. 11/15/22   Leroy Sea, MD  levothyroxine (SYNTHROID) 50 MCG tablet Take 50 mcg by mouth daily. 05/24/23   [provider]  levothyroxine (SYNTHROID) 75 MCG tablet Take 1 tablet (75 mcg total) by mouth daily at 6 (six) AM. 02/02/23   Sherryll Burger, Pratik D, DO  meclizine (ANTIVERT) 25 MG tablet Take 25 mg by mouth every 4 (four) hours as needed for dizziness. 04/19/23   [provider]  metoprolol tartrate (LOPRESSOR) 25 MG tablet Take 0.5 tablets (12.5 mg total) by mouth 2 (two) times daily. 02/01/23 05/02/23  Sherryll Burger, Pratik D, DO  multivitamin (RENA-VIT) TABS tablet Take  1 tablet by mouth daily.    [provider]  octreotide (SANDOSTATIN LAR) 20 MG injection Inject 20 mg into the muscle every 28 (twenty-eight) days. 11/15/22   Leroy Sea, MD  olmesartan (BENICAR) 20 MG tablet Take 20 mg by mouth daily. 01/14/23   [provider]  omeprazole (PRILOSEC) 20 MG capsule Take 1 capsule (20 mg total) by mouth 2 (two) times daily before a meal. Patient taking differently: Take 20 mg by mouth as needed (acid reflux). 02/01/23 05/02/23  Sherryll Burger, Pratik D, DO  omeprazole (PRILOSEC) 40 MG capsule Take 40 mg by mouth daily. 05/31/23   [provider]  ondansetron (ZOFRAN) 4 MG tablet Take 4 mg by mouth every 8 (eight) hours as needed. 02/27/23   [provider]  ondansetron (ZOFRAN-ODT) 4 MG disintegrating tablet Take 4 mg by mouth every 8 (eight) hours as needed. 05/22/23   [provider]  rosuvastatin (CRESTOR) 10 MG tablet Take 1 tablet by mouth once daily 06/06/22   Iran Ouch, [place] M, PA-C      Allergies    Amlodipine, Aspirin, Nitrofurantoin, Ranexa [ranolazine], Bactrim [sulfamethoxazole-trimethoprim], Iodinated contrast media, Iron, Tylenol [acetaminophen], Gabapentin, Sucroferric oxyhydroxide, Levaquin [levofloxacin in d5w], Levofloxacin, Plavix [clopidogrel bisulfate], Protonix [pantoprazole sodium], and Venofer [ferric oxide]    Review of Systems   Review of Systems  Physical Exam Updated Vital Signs BP 125/69 (BP Location: Left Leg)   Pulse (!) 52   Temp 97.6 F (36.4 C) (Oral)   Resp 18   Ht 5\' 2"  (1.575 m)   Wt 52.2 kg   SpO2 100%   BMI 21.03 kg/m  Physical Exam Vitals and nursing note reviewed.  Constitutional:      General: She is not in acute distress.    Appearance: She is well-developed.  HENT:     Head: Normocephalic and atraumatic.     Right Ear: External ear normal.     Left Ear: External ear normal.     Nose: Nose normal.  Eyes:     Extraocular Movements: Extraocular movements intact.     Conjunctiva/sclera: Conjunctivae normal.     Pupils: Pupils are equal, round, and reactive to light.  Cardiovascular:     Rate and Rhythm: Normal rate and regular rhythm.     Heart sounds: No murmur heard.    Comments: Fistula in left upper extremity without bruit or thrill.  Tunneled catheter in left chest wall Pulmonary:     Effort: Pulmonary effort is normal. No respiratory distress.     Breath sounds: Normal breath sounds.  Musculoskeletal:     Cervical back: Normal range of motion and neck supple.     Right lower leg: No edema.     Left lower leg: No edema.  Skin:     General: Skin is warm and dry.  Neurological:     Mental Status: She is alert and oriented to person, place, and time. Mental status is at baseline.  Psychiatric:        Mood and Affect: Mood normal.     ED Results / Procedures / Treatments   Labs (all labs ordered are listed, but only abnormal results are displayed) Labs Reviewed  BASIC METABOLIC PANEL - Abnormal; Notable for the following components:      Result Value   Sodium 131 (*)    Potassium 2.8 (*)    Chloride 97 (*)    Glucose, Bld 175 (*)    Creatinine, Ser 4.41 (*)  Calcium 8.1 (*)    GFR, Estimated 9 (*)    All other components within normal limits  CBC - Abnormal; Notable for the following components:   RBC 1.98 (*)    Hemoglobin 7.0 (*)    HCT 20.7 (*)    MCV 104.5 (*)    MCH 35.4 (*)    All other components within normal limits  MAGNESIUM - Abnormal; Notable for the following components:   Magnesium 1.5 (*)    All other components within normal limits  TROPONIN I (HIGH SENSITIVITY) - Abnormal; Notable for the following components:   Troponin I (High Sensitivity) 43 (*)    All other components within normal limits  RESP PANEL BY RT-PCR (RSV, FLU A&B, COVID)  RVPGX2  BASIC METABOLIC PANEL  CBC  CBG MONITORING, ED  POC OCCULT BLOOD, ED  TYPE AND SCREEN  PREPARE RBC (CROSSMATCH)  PREPARE RBC (CROSSMATCH)    EKG EKG Interpretation Date/Time:  Wednesday May 31 2023 14:38:40 EST Ventricular Rate:  51 PR Interval:  200 QRS Duration:  137 QT Interval:  520 QTC Calculation: 479 R Axis:   16  Text Interpretation: Sinus rhythm Left bundle branch block Confirmed by Vonita Moss [phone]) on 05/31/2023 2:55:32 PM  Radiology DG Chest Portable 1 View  Result Date: 05/31/2023 CLINICAL DATA:  Weakness.  End-stage renal disease on dialysis. EXAM: PORTABLE CHEST 1 VIEW COMPARISON:  Chest radiograph dated May 13, 2023. FINDINGS: Stable cardiomegaly. Stable left-sided central venous dialysis  catheter. Prior median sternotomy and CABG. Similar small bilateral layering pleural effusions with basilar atelectasis. Diffuse interstitial prominence. No pneumothorax. No acute osseous abnormality. IMPRESSION: Cardiomegaly with similar diffuse interstitial edema and small bilateral pleural effusions. Electronically Signed   By: Hart Robinsons M.D.   On: 05/31/2023 17:46    Procedures Procedures    Medications Ordered in ED Medications  0.9 %  sodium chloride infusion (Manually program via Guardrails IV Fluids) (has no administration in time range)  albuterol (PROVENTIL) (2.5 MG/3ML) 0.083% nebulizer solution 2.5 mg (has no administration in time range)  hydrALAZINE (APRESOLINE) injection 5 mg (has no administration in time range)  0.9 %  sodium chloride infusion (Manually program via Guardrails IV Fluids) (has no administration in time range)  ALPRAZolam (XANAX) tablet 0.5 mg (has no administration in time range)  folic acid (FOLVITE) tablet 1 mg (has no administration in time range)  gabapentin (NEURONTIN) capsule 100 mg (has no administration in time range)  hydrALAZINE (APRESOLINE) tablet 25 mg (has no administration in time range)  amiodarone (PACERONE) tablet 100 mg (has no administration in time range)  levETIRAcetam (KEPPRA) tablet 500 mg (has no administration in time range)  levothyroxine (SYNTHROID) tablet 75 mcg (has no administration in time range)  irbesartan (AVAPRO) tablet 37.5 mg (has no administration in time range)  pantoprazole (PROTONIX) EC tablet 40 mg (has no administration in time range)  rosuvastatin (CRESTOR) tablet 10 mg (has no administration in time range)  potassium chloride SA (KLOR-CON M) CR tablet 40 mEq (40 mEq Oral Given 05/31/23 1453)  hydrALAZINE (APRESOLINE) tablet 25 mg (25 mg Oral Given 05/31/23 1558)  magnesium sulfate IVPB 2 g 50 mL (2 g Intravenous New Bag/Given 05/31/23 1646)    ED Course/ Medical Decision Making/ A&P Clinical Course as of  05/31/23 1923  Wed May 31, 2023  1438 Hemoglobin(!): 7.0 Baseline of 8 [RP]  1529 Hemoccult negative [RP]  1612 Dr Florene Route from hospitalist to admit the patient [RP]    Clinical Course User  Index [RP] Rondel Baton, MD                                 Medical Decision Making Amount and/or Complexity of Data Reviewed Labs: ordered. Decision-making details documented in ED Course. Radiology: ordered.  Risk Prescription drug management. Decision regarding hospitalization.   MALIKIA BERGS is a 82 y.o. female with comorbidities that complicate the patient evaluation including ESRD on Monday, Wednesday, Friday dialysis, CAD status post PCI, heart failure, A-fib not on anticoagulation due to GI bleeds from possible AVM who presents emergency department with generalized weakness.    Initial Ddx:  Symptomatic anemia, hypoglycemia, electrolyte abnormality, MI, URI, pneumonia  MDM/Course:  Patient presents emergency department with generalized fatigue.  Did miss dialysis today because of her fatigue.  On exam is overall well-appearing does not have any focal neurologic deficits.  Had a broad workup send initially which included troponin and EKG to assess for both MI and hyperkalemia.  No evidence of either was found.  In fact, patient was found to be hypokalemia and hypomagnesemic.  These were repleted orally and twice daily respectively.  Was found to have a hemoglobin of 7.  COVID and flu sent to assess for URI since she was having some respiratory symptoms.  Had extensive conversation with the patient and her husband and at this time concerned that it could potentially be symptomatic anemia causing her symptoms.  With her cardiac history does have a goal hemoglobin of 8 we will go ahead and transfuse her.  With her history of renal disease and the fact that she is now missing dialysis due to her symptoms we will go ahead and admit her to the hospital as well.  Chest x-ray did show mild  volume overload the patient is satting well on room air.  Nephrology consulted for dialysis.  Awaiting their response at this time.  This patient presents to the ED for concern of complaints listed in HPI, this involves an extensive number of treatment options, and is a complaint that carries with it a high risk of complications and morbidity. Disposition including potential need for admission considered.   Dispo: Admit to Floor  Additional history obtained from spouse Records reviewed Outpatient Clinic Notes The following labs were independently interpreted: CBC and show acute anemia and chronic anemia I independently reviewed the following imaging with scope of interpretation limited to determining acute life threatening conditions related to emergency care: Chest x-ray and agree with the radiologist interpretation with the following exceptions: none I personally reviewed and interpreted cardiac monitoring: normal sinus rhythm  I personally reviewed and interpreted the pt's EKG: see above for interpretation  I have reviewed the patients home medications and made adjustments as needed Consults: Hospitalist Social Determinants of health:  Elderly  Portions of this note were generated with Scientist, clinical (histocompatibility and immunogenetics). Dictation errors may occur despite best attempts at proofreading.    CRITICAL CARE Performed by: Rondel Baton   Total critical care time: 30 minutes  Critical care time was exclusive of separately billable procedures and treating other patients.  Critical care was necessary to treat or prevent imminent or life-threatening deterioration.  Critical care was time spent personally by me on the following activities: development of treatment plan with patient and/or surrogate as well as nursing, discussions with consultants, evaluation of patient's response to treatment, examination of patient, obtaining history from patient or surrogate, ordering and performing treatments  and  interventions, ordering and review of laboratory studies, ordering and review of radiographic studies, pulse oximetry and re-evaluation of patient's condition.   Final Clinical Impression(s) / ED Diagnoses Final diagnoses:  Generalized weakness  Symptomatic anemia  ESRD on dialysis [name])    Rx / DC Orders ED Discharge Orders     None         Rondel Baton, MD 05/31/23 [phone]

## 2023-05-31 NOTE — H&P (Signed)
TRH H&P   Patient Demographics:    Shawna [name], is a 82 y.o. female  MRN: [id number]   DOB - 1941-01-24  Admit Date - 05/31/2023  Outpatient Primary MD for the patient is [facility]  Referring MD/NP/PA: Dr Eloise Harman   Patient coming from: home  Chief Complaint  Patient presents with   Weakness      HPI:    Shawna [name]  is a 82 y.o. female,  with medical history significant for recurrent GI bleeds possibly due to AVMs,  paroxysmal atrial fibrillation no longer on anticoagulation due to recurrent GI bleed, end-stage renal disease on hemodialysis Monday/ Wednesday/Fridays, seizure disorder, and coronary artery disease status post CABG and PCI . -Jent was brought by her husband due to weakness, husband report patient with progressive weakness over the last week, reports she went for dialysis on Monday, but she was too weak today to go for dialysis, she has been having some congestion, cough, and dyspnea, no melena, no vomiting, no coffee-ground emesis, she is not on any anticoagulation, no fever, no chills.  But reports this feels typical for her when she has her anemia which she usually gets with blood transfusion. -In ED her workup was significant for hemoglobin of 7, but her Hemoccult was negative, she was on room air, labs were significant for low potassium at 2.8, and magnesium of 1.5, given she missed her dialysis today, and need for transfusion [facility] hospitalist consulted to admit.    Review of systems:      A full 10 point Review of Systems was done, except as stated above, all other Review of Systems were negative.   With Past History of the following :    Past Medical History:  Diagnosis Date   Acute on chronic respiratory failure with hypoxia (HCC) 10/10/2016   Anxiety    Arthritis    AVM (arteriovenous malformation) of colon    CAD (coronary artery  disease)    a. s/p CABG in 2013 b. DES to D1 in 10/2016. c. cath in 07/2018 showing patent grafts with occlusion of D1 at prior stent site and progression of PDA disease --> medical management recommended   Carotid artery disease (HCC)    a. 60-79% LICA, 03/2012    Chronic bronchitis (HCC)    Chronic HFrEF (heart failure with reduced ejection fraction) (HCC)    Colon cancer (HCC) 1992   Esophageal stricture    ESRD on hemodialysis (HCC)    ESRD due to HTN, started dialysis 2011 and gets HD at [facility] with Dr Fausto Skillern on MWF schedule.  Access is LUA AVF as of Sept 2014.    GERD (gastroesophageal reflux disease)    High cholesterol 12/2011   History of blood transfusion 07/2011; 12/2011; 01/2012 X 2; 04/2012   History of gout    History of lower GI bleeding    Hypertension  Iron deficiency anemia    Jugular vein occlusion, right (HCC)    Mitral regurgitation    a. Moderate by echo, 02/2012   Mitral valve disease    NSVT (nonsustained ventricular tachycardia) (HCC)    Ovarian cancer (HCC) 1992   PAF (paroxysmal atrial fibrillation) (HCC)    Pneumonia ~ 2009   PUD (peptic ulcer disease)    TIA (transient ischemic attack)    Tricuspid valve disease       Past Surgical History:  Procedure Laterality Date   A/V FISTULAGRAM Left 10/04/2022   Procedure: A/V Fistulagram;  Surgeon: Renford Dills, MD;  Location: [facility] INVASIVE CV LAB;  Service: Cardiovascular;  Laterality: Left;   A/V SHUNTOGRAM Left 03/19/2019   Procedure: A/V SHUNTOGRAM;  Surgeon: Renford Dills, MD;  Location: [facility] INVASIVE CV LAB;  Service: Cardiovascular;  Laterality: Left;   ABDOMINAL HYSTERECTOMY  1992   APPENDECTOMY  06/1990   AV FISTULA PLACEMENT  07/2009   left upper arm   AV FISTULA PLACEMENT Right 09/06/2016   Procedure: RIGHT FOREARM ARTERIOVENOUS (AV) GRAFT;  Surgeon: Sherren Kerns, MD;  [facility]: [facility];  Service: Vascular;  Laterality: Right;   AV FISTULA PLACEMENT N/A 02/24/2017   Procedure:  INSERTION OF ARTERIOVENOUS (AV) GORE-TEX GRAFT ARM (BRACHIAL AXILLARY);  Surgeon: Renford Dills, MD;  Location: [facility] ORS;  Service: Vascular;  Laterality: N/A;   AVGG REMOVAL Right 09/06/2016   Procedure: REMOVAL OF Right Arm ARTERIOVENOUS GORETEX GRAFT and Vein Patch angioplasty of brachial artery;  Surgeon: Chuck Hint, MD;  Location: [facility] OR;  Service: Vascular;  Laterality: Right;   BIOPSY  09/26/2019   Procedure: BIOPSY;  Surgeon: Malissa Hippo, MD;  [facility];  Service: Endoscopy;;   BIOPSY  11/01/2022   Procedure: BIOPSY;  Surgeon: Dolores Frame, MD;  [facility];  Service: Gastroenterology;;   COLON RESECTION  1992   COLON SURGERY     COLONOSCOPY N/A 03/09/2019   Procedure: COLONOSCOPY;  Surgeon: Malissa Hippo, MD;  [facility];  Service: Endoscopy;  Laterality: N/A;   COLONOSCOPY N/A 08/11/2022   Procedure: COLONOSCOPY;  Surgeon: Beverley Fiedler, MD;  Location: [facility] ENDOSCOPY;  Service: Gastroenterology;  Laterality: N/A;   COLONOSCOPY WITH PROPOFOL N/A 06/08/2021   Procedure: COLONOSCOPY WITH PROPOFOL;  Surgeon: Dolores Frame, MD;  [facility];  Service: Gastroenterology;  Laterality: N/A;  9:05 /Patient is on dialysis Mon Wed Fri   CORONARY ANGIOPLASTY WITH STENT PLACEMENT  12/15/11   "2"   CORONARY ANGIOPLASTY WITH STENT PLACEMENT  y/2013   "1; makes total of 3" (05/02/2012)   CORONARY ARTERY BYPASS GRAFT  06/13/2012   Procedure: CORONARY ARTERY BYPASS GRAFTING (CABG);  Surgeon: Delight Ovens, MD;  Location: [facility] OR;  Service: Open Heart Surgery;  Laterality: N/A;  cabg x four;  using left internal mammary artery, and left leg greater saphenous vein harvested endoscopically   CORONARY STENT INTERVENTION N/A 10/13/2016   Procedure: Coronary Stent Intervention;  Surgeon: Lennette Bihari, MD;  [facility];  Service: Cardiovascular;  Laterality: N/A;   DIALYSIS/PERMA CATHETER REMOVAL N/A  04/18/2017   Procedure: DIALYSIS/PERMA CATHETER REMOVAL;  Surgeon: Renford Dills, MD;  Location: [facility] INVASIVE CV LAB;  Service: Cardiovascular;  Laterality: N/A;   DILATION AND CURETTAGE OF UTERUS     ENTEROSCOPY N/A 06/08/2021   Procedure: PUSH ENTEROSCOPY;  Surgeon: Dolores Frame, MD;  [facility];  Service:  Gastroenterology;  Laterality: N/A;   ENTEROSCOPY N/A 11/01/2022   Procedure: ENTEROSCOPY;  Surgeon: Dolores Frame, MD;  [facility];  Service: Gastroenterology;  Laterality: N/A;   ENTEROSCOPY N/A 12/01/2022   Procedure: ENTEROSCOPY;  Surgeon: Benancio Deeds, MD;  Location: [facility] ENDOSCOPY;  Service: Gastroenterology;  Laterality: N/A;   ESOPHAGOGASTRODUODENOSCOPY  01/20/2012   Procedure: ESOPHAGOGASTRODUODENOSCOPY (EGD);  Surgeon: Meryl Dare, MD,FACG;  Location: [facility] ENDOSCOPY;  Service: Endoscopy;  Laterality: N/A;   ESOPHAGOGASTRODUODENOSCOPY N/A 03/26/2013   Procedure: ESOPHAGOGASTRODUODENOSCOPY (EGD);  Surgeon: Hilarie Fredrickson, MD;  Location: [facility] ENDOSCOPY;  Service: Endoscopy;  Laterality: N/A;   ESOPHAGOGASTRODUODENOSCOPY N/A 04/30/2015   Procedure: ESOPHAGOGASTRODUODENOSCOPY (EGD);  Surgeon: Malissa Hippo, MD;  [facility];  Service: Endoscopy;  Laterality: N/A;  1pm - moved to 10/20 @ 1:10   ESOPHAGOGASTRODUODENOSCOPY N/A 07/29/2016   Procedure: ESOPHAGOGASTRODUODENOSCOPY (EGD);  Surgeon: Ruffin Frederick, MD;  Location: [facility] ENDOSCOPY;  Service: Gastroenterology;  Laterality: N/A;  enteroscopy   ESOPHAGOGASTRODUODENOSCOPY N/A 09/26/2019   Procedure: ESOPHAGOGASTRODUODENOSCOPY (EGD);  Surgeon: Malissa Hippo, MD;  [facility];  Service: Endoscopy;  Laterality: N/A;  1250   ESOPHAGOGASTRODUODENOSCOPY N/A 08/11/2022   Procedure: ESOPHAGOGASTRODUODENOSCOPY (EGD);  Surgeon: Beverley Fiedler, MD;  Location: [facility] ENDOSCOPY;  Service: Gastroenterology;  Laterality: N/A;   ESOPHAGOGASTRODUODENOSCOPY (EGD) WITH  PROPOFOL N/A 02/05/2021   Procedure: ESOPHAGOGASTRODUODENOSCOPY (EGD) WITH PROPOFOL;  Surgeon: Lanelle Bal, DO;  [facility];  Service: Endoscopy;  Laterality: N/A;   ESOPHAGOGASTRODUODENOSCOPY (EGD) WITH PROPOFOL N/A 08/27/2022   Procedure: ESOPHAGOGASTRODUODENOSCOPY (EGD) WITH PROPOFOL;  Surgeon: Lanelle Bal, DO;  [facility];  Service: Endoscopy;  Laterality: N/A;   GIVENS CAPSULE STUDY N/A 03/07/2019   Procedure: GIVENS CAPSULE STUDY;  Surgeon: Malissa Hippo, MD;  [facility];  Service: Endoscopy;  Laterality: N/A;  7:30   GIVENS CAPSULE STUDY N/A 04/22/2021   Procedure: GIVENS CAPSULE STUDY;  Surgeon: Malissa Hippo, MD;  [facility];  Service: Endoscopy;  Laterality: N/A;  7:30   GIVENS CAPSULE STUDY N/A 08/27/2022   Procedure: GIVENS CAPSULE STUDY;  Surgeon: Lanelle Bal, DO;  [facility];  Service: Endoscopy;  Laterality: N/A;   HOT HEMOSTASIS N/A 08/11/2022   Procedure: HOT HEMOSTASIS (ARGON PLASMA COAGULATION/BICAP);  Surgeon: Beverley Fiedler, MD;  Location: [facility] ENDOSCOPY;  Service: Gastroenterology;  Laterality: N/A;   HOT HEMOSTASIS N/A 12/01/2022   Procedure: HOT HEMOSTASIS (ARGON PLASMA COAGULATION/BICAP);  Surgeon: Benancio Deeds, MD;  Location: [facility] ENDOSCOPY;  Service: Gastroenterology;  Laterality: N/A;   INSERTION OF DIALYSIS CATHETER N/A 10/05/2020   Procedure: ABORTED TUNNELED DIALYSIS CATHETER PLACEMENT RIGHT INTERNAL JUGULAR VEIN ;  Surgeon: Lucretia Roers, MD;  Location: [facility]S;  Service: General;  Laterality: N/A;   INTRAOPERATIVE TRANSESOPHAGEAL ECHOCARDIOGRAM  06/13/2012   Procedure: INTRAOPERATIVE TRANSESOPHAGEAL ECHOCARDIOGRAM;  Surgeon: Delight Ovens, MD;  Location: [facility] OR;  Service: Open Heart Surgery;  Laterality: N/A;   IR DIALY SHUNT INTRO NEEDLE/INTRACATH INITIAL W/IMG LEFT Left 10/06/2020   IR FLUORO GUIDE CV LINE RIGHT  06/17/2020   IR FLUORO GUIDE CV LINE RIGHT  11/12/2022   IR  GENERIC HISTORICAL  07/26/2016   IR FLUORO GUIDE CV LINE RIGHT 07/26/2016 Berdine Dance, MD [facility]   IR GENERIC HISTORICAL  07/26/2016   IR US GUIDE VASC ACCESS RIGHT 07/26/2016 Berdine Dance, MD [facility]   IR GENERIC HISTORICAL  08/02/2016   IR US GUIDE VASC  ACCESS RIGHT 08/02/2016 Berdine Dance, MD [facility]   IR GENERIC HISTORICAL  08/02/2016   IR FLUORO GUIDE CV LINE RIGHT 08/02/2016 Berdine Dance, MD [facility]   IR RADIOLOGY PERIPHERAL GUIDED IV START  03/28/2017   IR REMOVAL TUN CV CATH W/O FL  08/11/2020   IR REMOVAL TUN CV CATH W/O FL  11/15/2022   IR THROMBECTOMY AV FISTULA W/THROMBOLYSIS INC/SHUNT/IMG LEFT Left 06/17/2020   IR US GUIDE VASC ACCESS LEFT  06/17/2020   IR US GUIDE VASC ACCESS RIGHT  03/28/2017   IR US GUIDE VASC ACCESS RIGHT  06/17/2020   IR US GUIDE VASC ACCESS RIGHT  11/12/2022   LEFT HEART CATH AND CORONARY ANGIOGRAPHY N/A 09/20/2016   Procedure: Left Heart Cath and Coronary Angiography;  Surgeon: Lyn Records, MD;  Location: [facility] INVASIVE CV LAB;  Service: Cardiovascular;  Laterality: N/A;   LEFT HEART CATH AND CORS/GRAFTS ANGIOGRAPHY N/A 10/13/2016   Procedure: Left Heart Cath and Cors/Grafts Angiography;  Surgeon: Lennette Bihari, MD;  [facility]: [facility];  Service: Cardiovascular;  Laterality: N/A;   LEFT HEART CATH AND CORS/GRAFTS ANGIOGRAPHY N/A 07/13/2018   Procedure: LEFT HEART CATH AND CORS/GRAFTS ANGIOGRAPHY;  Surgeon: Swaziland, Peter M, MD;  Location: [facility] INVASIVE CV LAB;  Service: Cardiovascular;  Laterality: N/A;   LEFT HEART CATH AND CORS/GRAFTS ANGIOGRAPHY N/A 07/22/2021   Procedure: LEFT HEART CATH AND CORS/GRAFTS ANGIOGRAPHY;  Surgeon: Runell Gess, MD;  [facility]: [facility] INVAS[facility] CV LAB;  Service: Cardiovascular;  Laterality: N/A;   LEFT HEART CATHETERIZATION WITH CORONARY ANGIOGRAM N/A 12/15/2011   Procedure: LEFT HEART CATHETERIZATION WITH CORONARY ANGIOGRAM;  Surgeon: Kathleene Hazel, MD;  Location: [facility] CATH LAB;  Service: Cardiovascular;   Laterality: N/A;   LEFT HEART CATHETERIZATION WITH CORONARY ANGIOGRAM N/A 01/10/2012   Procedure: LEFT HEART CATHETERIZATION WITH CORONARY ANGIOGRAM;  Surgeon: Peter M Swaziland, MD;  Location: [facility] CATH LAB;  Service: Cardiovascular;  Laterality: N/A;   LEFT HEART CATHETERIZATION WITH CORONARY ANGIOGRAM N/A 06/08/2012   Procedure: LEFT HEART CATHETERIZATION WITH CORONARY ANGIOGRAM;  Surgeon: Kathleene Hazel, MD;  Location: [facility] CATH LAB;  Service: Cardiovascular;  Laterality: N/A;   LEFT HEART CATHETERIZATION WITH CORONARY/GRAFT ANGIOGRAM N/A 12/10/2013   Procedure: LEFT HEART CATHETERIZATION WITH Isabel Caprice;  Surgeon: Corky Crafts, MD;  Location: [facility] CATH LAB;  Service: Cardiovascular;  Laterality: N/A;   OVARY SURGERY     ovarian cancer   POLYPECTOMY  03/09/2019   Procedure: POLYPECTOMY;  Surgeon: Malissa Hippo, MD;  [facility];  Service: Endoscopy;;  cecal    POLYPECTOMY N/A 09/26/2019   Procedure: DUODENAL POLYPECTOMY;  Surgeon: Malissa Hippo, MD;  [facility];  Service: Endoscopy;  Laterality: N/A;   POLYPECTOMY  08/11/2022   Procedure: POLYPECTOMY;  Surgeon: Beverley Fiedler, MD;  Location: [facility] ENDOSCOPY;  Service: Gastroenterology;;   REVISION OF ARTERIOVENOUS GORETEX GRAFT N/A 02/24/2017   Procedure: REVISION OF ARTERIOVENOUS GORETEX GRAFT (RESECTION);  Surgeon: Renford Dills, MD;  Location: [facility] ORS;  Service: Vascular;  Laterality: N/A;   REVISON OF ARTERIOVENOUS FISTULA Left 06/19/2020   Procedure: REVISION OF LEFT UPPER ARM AV GRAFT WITH INTERPOSITION JUMP GRAFT USING GORE LIMB;  Surgeon: Cephus Shelling, MD;  Location: [facility] OR;  Service: Vascular;  Laterality: Left;   SHUNTOGRAM N/A 10/15/2013   Procedure: Fistulogram;  Surgeon: Nada Libman, MD;  Location: [facility] CATH LAB;  Service: Cardiovascular;  Laterality: N/A;   SUBMUCOSAL TATTOO INJECTION  11/01/2022  Procedure: SUBMUCOSAL TATTOO INJECTION;  Surgeon: Marguerita Merles,  Reuel Boom, MD;  [facility];  Service: Gastroenterology;;   THROMBECTOMY / ARTERIOVENOUS GRAFT REVISION  2011   left upper arm   TUBAL LIGATION  1980's   UPPER EXTREMITY ANGIOGRAPHY Bilateral 12/06/2016   Procedure: Upper Extremity Angiography;  Surgeon: Renford Dills, MD;  Location: [facility] INVASIVE CV LAB;  Service: Cardiovascular;  Laterality: Bilateral;   UPPER EXTREMITY INTERVENTION Left 06/06/2017   Procedure: UPPER EXTREMITY INTERVENTION;  Surgeon: Renford Dills, MD;  Location: [facility] INVASIVE CV LAB;  Service: Cardiovascular;  Laterality: Left;   UPPER EXTREMITY VENOGRAPHY Left 10/04/2022   Procedure: UPPER EXTREMITY VENOGRAPHY;  Surgeon: Renford Dills, MD;  Location: [facility] INVASIVE CV LAB;  Service: Cardiovascular;  Laterality: Left;      Social History:     Social History   Tobacco Use   Smoking status: Never   Smokeless tobacco: Never  Substance Use Topics   Alcohol use: No    Alcohol/week: 0.0 standard drinks of alcohol      Family History :     Family History  Problem Relation Age of Onset   Heart disease Mother        Heart Disease before age 46   Hyperlipidemia Mother    Hypertension Mother    Diabetes Mother    Heart attack Mother    Heart disease Father        Heart Disease before age 34   Hyperlipidemia Father    Hypertension Father    Diabetes Father    Diabetes Sister    Hypertension Sister    Diabetes Brother    Hyperlipidemia Brother    Heart attack Brother    Hypertension Sister    Heart attack Brother    Colon cancer Child 22   Other Other        noncontributory for early CAD   Esophageal cancer Neg Hx    Liver disease Neg Hx    Kidney disease Neg Hx    Colon polyps Neg Hx       Home Medications:   Prior to Admission medications   Medication Sig Start Date End Date Taking? Authorizing Provider  amiodarone (PACERONE) 200 MG tablet Take 200 mg by mouth daily. 05/31/23  Yes [provider]  nitroGLYCERIN  (NITROSTAT) 0.4 MG SL tablet Place 0.4 mg under the tongue every 5 (five) minutes as needed for chest pain.   Yes [provider]  ALPRAZolam Prudy Feeler) 0.5 MG tablet Take 1 tablet (0.5 mg total) by mouth daily as needed for up to 4 doses for anxiety. 12/19/22   Lanae Boast, MD  amiodarone (PACERONE) 100 MG tablet Take 1 tablet (100 mg total) by mouth daily. 02/02/23 05/02/23  Sherryll Burger, Pratik D, DO  benzonatate (TESSALON) 100 MG capsule Take 1 capsule (100 mg total) by mouth 3 (three) times daily as needed for cough. 01/18/23   Tilden Fossa, MD  calcitRIOL (ROCALTROL) 0.25 MCG capsule Take 7 capsules (1.75 mcg total) by mouth every Monday, Wednesday, and Friday with hemodialysis. 12/21/22   Lanae Boast, MD  cinacalcet (SENSIPAR) 30 MG tablet Take 1 tablet (30 mg total) by mouth every Monday, Wednesday, and Friday with hemodialysis. 12/19/22   Lanae Boast, MD  cyanocobalamin (VITAMIN B12) 1000 MCG/ML injection Inject 1,000 mcg into the muscle every 30 (thirty) days.    [provider]  folic acid (FOLVITE) 1 MG tablet Take 1 mg by mouth daily. 05/24/22   [provider]  gabapentin (NEURONTIN) 100 MG capsule Take 1 capsule (100 mg total) by mouth every evening. 12/13/22   Jerald Kief, MD  hydrALAZINE (APRESOLINE) 25 MG tablet Take 1 tablet (25 mg total) by mouth 2 (two) times daily. Hold for sbp <110 12/27/22   Lanae Boast, MD  hydrOXYzine (ATARAX) 25 MG tablet Take 25 mg by mouth every 6 (six) hours as needed for itching. 06/06/22   [provider]  levETIRAcetam (KEPPRA) 500 MG tablet Take 1 tablet (500 mg total) by mouth daily. 11/15/22   Leroy Sea, MD  levothyroxine (SYNTHROID) 50 MCG tablet Take 50 mcg by mouth daily. 05/24/23   [provider]  levothyroxine (SYNTHROID) 75 MCG tablet Take 1 tablet (75 mcg total) by mouth daily at 6 (six) AM. 02/02/23   Sherryll Burger, Pratik D, DO  meclizine (ANTIVERT) 25 MG tablet Take 25 mg by mouth every 4 (four) hours as needed for  dizziness. 04/19/23   [provider]  metoprolol tartrate (LOPRESSOR) 25 MG tablet Take 0.5 tablets (12.5 mg total) by mouth 2 (two) times daily. 02/01/23 05/02/23  Sherryll Burger, Pratik D, DO  multivitamin (RENA-VIT) TABS tablet Take 1 tablet by mouth daily.    [provider]  octreotide (SANDOSTATIN LAR) 20 MG injection Inject 20 mg into the muscle every 28 (twenty-eight) days. 11/15/22   Leroy Sea, MD  olmesartan (BENICAR) 20 MG tablet Take 20 mg by mouth daily. 01/14/23   [provider]  omeprazole (PRILOSEC) 20 MG capsule Take 1 capsule (20 mg total) by mouth 2 (two) times daily before a meal. Patient taking differently: Take 20 mg by mouth as needed (acid reflux). 02/01/23 05/02/23  Sherryll Burger, Pratik D, DO  omeprazole (PRILOSEC) 40 MG capsule Take 40 mg by mouth daily. 05/31/23   [provider]  ondansetron (ZOFRAN) 4 MG tablet Take 4 mg by mouth every 8 (eight) hours as needed. 02/27/23   [provider]  ondansetron (ZOFRAN-ODT) 4 MG disintegrating tablet Take 4 mg by mouth every 8 (eight) hours as needed. 05/22/23   [provider]  rosuvastatin (CRESTOR) 10 MG tablet Take 1 tablet by mouth once daily 06/06/22   Iran Ouch, Lennart Pall, PA-C     Allergies:     Allergies  Allergen Reactions   Amlodipine Swelling   Aspirin Other (See Comments)    High Doses Mess up her stomach; "makes my bowels have blood in them". Takes 81 mg EC Aspirin    Nitrofurantoin Hives   Ranexa [Ranolazine] Other (See Comments)    Myoclonus-hospitalized    Bactrim [Sulfamethoxazole-Trimethoprim] Rash   Iodinated Contrast Media Itching and Other (See Comments)   Iron Itching and Other (See Comments)    "they gave me iron in dialysis; had to give me Benadryl cause I had to have the iron" (05/02/2012)   Tylenol [Acetaminophen] Itching and Other (See Comments)    Makes her feet on fire per pt - can tolerate per pt   Gabapentin Other (See Comments)    Unknown reaction    Sucroferric Oxyhydroxide Other (See Comments)    Unknown   Levaquin [Levofloxacin In D5w] Rash   Levofloxacin Rash and Other (See Comments)   Plavix [Clopidogrel Bisulfate] Rash   Protonix [Pantoprazole Sodium] Rash   Venofer [Ferric Oxide] Itching and Other (See Comments)    Patient reports using Benadryl prior to doses as [facility]     Physical Exam:   Vitals  Blood pressure (!) 193/151, pulse (!) 52, temperature 97.8  F (36.6 C), temperature source Oral, resp. rate (!) 25, height 5\' 2"  (1.575 m), weight 52.2 kg, SpO2 97%.   1. General frail, elderly female  lying in bed in NAD,    2. Normal affect and insight, Not Suicidal or Homicidal, Awake Alert, Oriented X 3.  3. No F.N deficits, ALL C.Nerves Intact, Strength 5/5 all 4 extremities, Sensation intact all 4 extremities, Plantars down going.  4. Ears and Eyes appear Normal, Conjunctivae clear, PERRLA. Moist Oral Mucosa.  5. Supple Neck, No JVD, No cervical lymphadenopathy appriciated, No Carotid Bruits.  6. Symmetrical Chest wall movement, Good air movement bilaterally,no wheezing..  7. RRR, No Gallops, Rubs or Murmurs, No Parasternal Heave, +2 edema  8. Positive Bowel Sounds, Abdomen Soft, No tenderness, No organomegaly appriciated,No rebound -guarding or rigidity.  9.  No Cyanosis, Normal Skin Turgor, No Skin Rash or Bruise.  10. Good muscle tone,  joints appear normal , no effusions, Normal ROM.   Data Review:    CBC Recent Labs  Lab 05/31/23 1256  WBC 4.1  HGB 7.0*  HCT 20.7*  PLT 236  MCV 104.5*  MCH 35.4*  MCHC 33.8  RDW Not Measured   ------------------------------------------------------------------------------------------------------------------  Chemistries  Recent Labs  Lab 05/31/23 1256  NA 131*  K 2.8*  CL 97*  CO2 25  GLUCOSE 175*  BUN 13  CREATININE 4.41*  CALCIUM 8.1*  MG 1.5*    ------------------------------------------------------------------------------------------------------------------ estimated creatinine clearance is 7.6 mL/min (A) (by C-G formula based on SCr of 4.41 mg/dL (H)). ------------------------------------------------------------------------------------------------------------------ No results for input(s): "TSH", "T4TOTAL", "T3FREE", "THYROIDAB" in the last 72 hours.  Invalid input(s): "FREET3"  Coagulation profile No results for input(s): "INR", "PROTIME" in the last 168 hours. ------------------------------------------------------------------------------------------------------------------- No results for input(s): "DDIMER" in the last 72 hours. -------------------------------------------------------------------------------------------------------------------  Cardiac Enzymes No results for input(s): "CKMB", "TROPONINI", "MYOGLOBIN" in the last 168 hours.  Invalid input(s): "CK" ------------------------------------------------------------------------------------------------------------------    Component Value Date/Time   BNP 3,351.0 (H) 05/13/2023 1500     ---------------------------------------------------------------------------------------------------------------  Urinalysis    Component Value Date/Time   COLORURINE YELLOW 12/16/2012 1919   APPEARANCEUR CLOUDY (A) 12/16/2012 1919   LABSPEC 1.009 12/16/2012 1919   PHURINE 7.5 12/16/2012 1919   GLUCOSEU NEGATIVE 12/16/2012 1919   HGBUR TRACE (A) 12/16/2012 1919   BILIRUBINUR NEGATIVE 12/16/2012 1919   KETONESUR NEGATIVE 12/16/2012 1919   PROTEINUR 100 (A) 12/16/2012 1919   UROBILINOGEN 0.2 12/16/2012 1919   NITRITE NEGATIVE 12/16/2012 1919   LEUKOCYTESUR SMALL (A) 12/16/2012 1919    ----------------------------------------------------------------------------------------------------------------   Imaging Results:    No results found.    Assessment & Plan:     Active Problems:   Acute on chronic blood loss anemia   Acquired hypothyroidism   Chronic combined systolic and diastolic CHF (congestive heart failure) (HCC)   CAD (coronary artery disease)   Anemia of chronic disease   Symptomatic anemia  Weakness-deconditioning due to Symptomatic anemia Anemia of chronic kidney disease -Presented with generalized weakness, deconditioning, she missed her HD today because of that -Significant for anemia hemoglobin of 7, her Hemoccult is negative -Will need 1 unit PRBC transfusion, which has ordered, she has antibodies so unit has to be crossed and matched at [facility] and brought over -Place potassium and magnesium -Will consult PT -Procrit  and iron per renal if needed   ESRD -Renal Consulted, she missed HD today, though she will be dialyzed in a.m., renal aware.  Atrial tachycardia/paroxysmal atrial fibrillation/flutter/?WCT: -Has been seen by cardiology in  the past, continue rhythm control medication with amiodarone and metoprolol -Not on anticoagulation secondary to recurrent GI bleed   Chronic systolic CHF with EF 30-35% CAD status post CABG with residual small vessel disease -Has any chest pain or dyspnea, but reports generalized weakness, will receive a unit PRBC -Volume management with dialysis -Aspirin for anticoagulation secondary  -Not on aspirin or anticoagulation given recurrent GI bleed requiring multiple transfusions   History of AVM -Denies any GI bleeds, she is Hemoccult negative in ED  hypertension -Continue with home medications, will add as needed hydralazine   Hyperlipidemia: Continue with statin   Type diabetes mellitus  -This is diet controlled   Acquired hypothyroidism  -With Synthroid  DVT Prophylaxis SCDs   AM Labs Ordered, also please review Full Orders  Family Communication: Admission, patients condition and plan of care including tests being ordered have been discussed with the patient and  husband at bedside* who indicate understanding and agree with the plan and Code Status.  Code Status full code  Likely DC to home  Consults called: Renal aware patient will need HD tomorrow.  Admission status: Observation  Time spent in minutes : 70 minutes   Huey Bienenstock M.D on 05/31/2023 at 4:40 PM   [facility] - Office  [phone]

## 2023-05-31 NOTE — ED Triage Notes (Signed)
Pt to er, pt states that she is here for weakness, states that she has dialysis today, but couldn't make it because she was too weak.  Pt states that she has been feeling weak since yesterday. Pt reports generalized weakness.

## 2023-06-01 DIAGNOSIS — E039 Hypothyroidism, unspecified: Secondary | ICD-10-CM

## 2023-06-01 DIAGNOSIS — D638 Anemia in other chronic diseases classified elsewhere: Secondary | ICD-10-CM | POA: Diagnosis not present

## 2023-06-01 DIAGNOSIS — D62 Acute posthemorrhagic anemia: Secondary | ICD-10-CM | POA: Diagnosis not present

## 2023-06-01 DIAGNOSIS — I25118 Atherosclerotic heart disease of native coronary artery with other forms of angina pectoris: Secondary | ICD-10-CM | POA: Diagnosis not present

## 2023-06-01 DIAGNOSIS — D649 Anemia, unspecified: Secondary | ICD-10-CM

## 2023-06-01 DIAGNOSIS — I5042 Chronic combined systolic (congestive) and diastolic (congestive) heart failure: Secondary | ICD-10-CM

## 2023-06-01 LAB — RENAL FUNCTION PANEL
Albumin: 1.9 g/dL — ABNORMAL LOW (ref 3.5–5.0)
Anion gap: 8 (ref 5–15)
BUN: 17 mg/dL (ref 8–23)
CO2: 27 mmol/L (ref 22–32)
Calcium: 8.3 mg/dL — ABNORMAL LOW (ref 8.9–10.3)
Chloride: 96 mmol/L — ABNORMAL LOW (ref 98–111)
Creatinine, Ser: 5.12 mg/dL — ABNORMAL HIGH (ref 0.44–1.00)
GFR, Estimated: 8 mL/min — ABNORMAL LOW (ref >60)
Glucose, Bld: 105 mg/dL — ABNORMAL HIGH (ref 70–99)
Phosphorus: 4.3 mg/dL (ref 2.5–4.6)
Potassium: 3.6 mmol/L (ref 3.5–5.1)
Sodium: 131 mmol/L — ABNORMAL LOW (ref 135–145)

## 2023-06-01 LAB — CBC
HCT: 20.7 % — ABNORMAL LOW (ref 36.0–46.0)
Hemoglobin: 7 g/dL — ABNORMAL LOW (ref 12.0–15.0)
MCH: 35.4 pg — ABNORMAL HIGH (ref 26.0–34.0)
MCHC: 33.8 g/dL (ref 30.0–36.0)
MCV: 104.5 fL — ABNORMAL HIGH (ref 80.0–100.0)
Platelets: 246 10*3/uL (ref 150–400)
RBC: 1.98 MIL/uL — ABNORMAL LOW (ref 3.87–5.11)
WBC: 4.3 10*3/uL (ref 4.0–10.5)
nRBC: 0 % (ref 0.0–0.2)

## 2023-06-01 LAB — BASIC METABOLIC PANEL
Anion gap: 9 (ref 5–15)
BUN: 17 mg/dL (ref 8–23)
CO2: 27 mmol/L (ref 22–32)
Calcium: 8.5 mg/dL — ABNORMAL LOW (ref 8.9–10.3)
Chloride: 96 mmol/L — ABNORMAL LOW (ref 98–111)
Creatinine, Ser: 5.15 mg/dL — ABNORMAL HIGH (ref 0.44–1.00)
GFR, Estimated: 8 mL/min — ABNORMAL LOW (ref >60)
Glucose, Bld: 104 mg/dL — ABNORMAL HIGH (ref 70–99)
Potassium: 3.6 mmol/L (ref 3.5–5.1)
Sodium: 132 mmol/L — ABNORMAL LOW (ref 135–145)

## 2023-06-01 LAB — HEPATITIS B SURFACE ANTIGEN
Hepatitis B Surface Ag: NONREACTIVE
Hepatitis B Surface Ag: NONREACTIVE

## 2023-06-01 LAB — OCCULT BLOOD, POC DEVICE: Fecal Occult Bld: NEGATIVE

## 2023-06-01 LAB — PREPARE RBC (CROSSMATCH)

## 2023-06-01 MED ORDER — SODIUM CHLORIDE 0.9% IV SOLUTION: Freq: Once | INTRAVENOUS | Status: AC

## 2023-06-01 MED ORDER — CHLORHEXIDINE GLUCONATE CLOTH 2 % EX PADS
6.0000 | MEDICATED_PAD | Freq: Every day | CUTANEOUS | Status: DC
  Administered 2023-06-02 – 2023-06-05 (×4): 6 via TOPICAL

## 2023-06-01 MED ORDER — CHLORHEXIDINE GLUCONATE CLOTH 2 % EX PADS
6.0000 | MEDICATED_PAD | Freq: Every day | CUTANEOUS | Status: DC
  Administered 2023-06-01 – 2023-06-02 (×2): 6 via TOPICAL

## 2023-06-01 MED ORDER — DARBEPOETIN ALFA 200 MCG/0.4ML IJ SOSY
200.0000 ug | PREFILLED_SYRINGE | Freq: Once | INTRAMUSCULAR | Status: AC
  Administered 2023-06-01: 200 ug via SUBCUTANEOUS
  Filled 2023-06-01: qty 0.4

## 2023-06-01 MED ORDER — ALTEPLASE 2 MG IJ SOLR: 2.0000 mg | Freq: Once | INTRAMUSCULAR | Status: DC | PRN

## 2023-06-01 MED ORDER — ONDANSETRON HCL 4 MG/2ML IJ SOLN: 4.0000 mg | Freq: Four times a day (QID) | INTRAMUSCULAR | Status: DC | PRN

## 2023-06-01 MED ORDER — HEPARIN SODIUM (PORCINE) 1000 UNIT/ML DIALYSIS
1000.0000 [IU] | INTRAMUSCULAR | Status: DC | PRN
  Administered 2023-06-01 – 2023-06-05 (×2): 3800 [IU]

## 2023-06-01 NOTE — Plan of Care (Signed)

## 2023-06-01 NOTE — Consult Note (Signed)
Reason for Consult: To manage dialysis and dialysis related needs Referring Physician: Dr Cherly Beach is an 82 y.o. female.  HPI: PT is an 71F with a  PMH sig for ESRD on HD, CAD, CHF, afib not on AC, recurrent GI bleeding who is now seen in consultation at the request of DR [facility] for management of ESRD and provision of HD.    Brought in by husband for 1 week of progressive weakness.  Missed HD yesterday.  Hemoccult negative, Hgb 7.0.  K and Mg low.  Trops low.    Just got some Ativan and so is a little sleepy- she is falling asleep while she is trying to order lunch here.  PT/OT have just left the room.  She is sitting up in her chair.  Dialysis Orders:  MWF at [facility] 3 hr 30 min EDW 55kg TDC 2K /2.5Ca BFR 300 DFR 500 Calcitrol 0.27mcg PO q HD Mircera 200 mcg IV q 2 weeks-  Past Medical History:  Diagnosis Date   Acute on chronic respiratory failure with hypoxia (HCC) 10/10/2016   Anxiety    Arthritis    AVM (arteriovenous malformation) of colon    CAD (coronary artery disease)    a. s/p CABG in 2013 b. DES to D1 in 10/2016. c. cath in 07/2018 showing patent grafts with occlusion of D1 at prior stent site and progression of PDA disease --> medical management recommended   Carotid artery disease (HCC)    a. 60-79% LICA, 03/2012    Chronic bronchitis (HCC)    Chronic HFrEF (heart failure with reduced ejection fraction) (HCC)    Colon cancer (HCC) 1992   Esophageal stricture    ESRD on hemodialysis (HCC)    ESRD due to HTN, started dialysis 2011 and gets HD at [facility] with Dr Fausto Skillern on MWF schedule.  Access is LUA AVF as of Sept 2014.    GERD (gastroesophageal reflux disease)    High cholesterol 12/2011   History of blood transfusion 07/2011; 12/2011; 01/2012 X 2; 04/2012   History of gout    History of lower GI bleeding    Hypertension    Iron deficiency anemia    Jugular vein occlusion, right (HCC)    Mitral regurgitation    a. Moderate by echo, 02/2012    Mitral valve disease    NSVT (nonsustained ventricular tachycardia) (HCC)    Ovarian cancer (HCC) 1992   PAF (paroxysmal atrial fibrillation) (HCC)    Pneumonia ~ 2009   PUD (peptic ulcer disease)    TIA (transient ischemic attack)    Tricuspid valve disease     Past Surgical History:  Procedure Laterality Date   A/V FISTULAGRAM Left 10/04/2022   Procedure: A/V Fistulagram;  Surgeon: Renford Dills, MD;  Location: [facility] INVASIVE CV LAB;  Service: Cardiovascular;  Laterality: Left;   A/V SHUNTOGRAM Left 03/19/2019   Procedure: A/V SHUNTOGRAM;  Surgeon: Renford Dills, MD;  Location: [facility] INVASIVE CV LAB;  Service: Cardiovascular;  Laterality: Left;   ABDOMINAL HYSTERECTOMY  1992   APPENDECTOMY  06/1990   AV FISTULA PLACEMENT  07/2009   left upper arm   AV FISTULA PLACEMENT Right 09/06/2016   Procedure: RIGHT FOREARM ARTERIOVENOUS (AV) GRAFT;  Surgeon: Sherren Kerns, MD;  [facility] OR;  Service: Vascular;  Laterality: Right;   AV FISTULA PLACEMENT N/A 02/24/2017   Procedure: INSERTION OF ARTERIOVENOUS (AV) GORE-TEX GRAFT ARM (BRACHIAL AXILLARY);  Surgeon: Renford Dills, MD;  Location:  [facility] ORS;  Service: Vascular;  Laterality: N/A;   AVGG REMOVAL Right 09/06/2016   Procedure: REMOVAL OF Right Arm ARTERIOVENOUS GORETEX GRAFT and Vein Patch angioplasty of brachial artery;  Surgeon: Chuck Hint, MD;  Location: [facility] OR;  Service: Vascular;  Laterality: Right;   BIOPSY  09/26/2019   Procedure: BIOPSY;  Surgeon: Malissa Hippo, MD;  [facility]O [facility];  Service: Endoscopy;;   BIOPSY  11/01/2022   Procedure: BIOPSY;  Surgeon: Dolores Frame, MD;  [facility];  Service: Gastroenterology;;   COLON RESECTION  1992   COLON SURGERY     COLONOSCOPY N/A 03/09/2019   Procedure: COLONOSCOPY;  Surgeon: Malissa Hippo, MD;  [facility];  Service: Endoscopy;  Laterality: N/A;   COLONOSCOPY N/A 08/11/2022   Procedure: COLONOSCOPY;  Surgeon:  Beverley Fiedler, MD;  Location: [facility] ENDOSCOPY;  Service: Gastroenterology;  Laterality: N/A;   COLONOSCOPY WITH PROPOFOL N/A 06/08/2021   Procedure: COLONOSCOPY WITH PROPOFOL;  Surgeon: Dolores Frame, MD;  [facility];  Service: Gastroenterology;  Laterality: N/A;  9:05 /Patient is on dialysis Mon Wed Fri   CORONARY ANGIOPLASTY WITH STENT PLACEMENT  12/15/11   "2"   CORONARY ANGIOPLASTY WITH STENT PLACEMENT  y/2013   "1; makes total of 3" (05/02/2012)   CORONARY ARTERY BYPASS GRAFT  06/13/2012   Procedure: CORONARY ARTERY BYPASS GRAFTING (CABG);  Surgeon: Delight Ovens, MD;  Location: [facility] OR;  Service: Open Heart Surgery;  Laterality: N/A;  cabg x four;  using left internal mammary artery, and left leg greater saphenous vein harvested endoscopically   CORONARY STENT INTERVENTION N/A 10/13/2016   Procedure: Coronary Stent Intervention;  Surgeon: Lennette Bihari, MD;  [facility];  Service: Cardiovascular;  Laterality: N/A;   DIALYSIS/PERMA CATHETER REMOVAL N/A 04/18/2017   Procedure: DIALYSIS/PERMA CATHETER REMOVAL;  Surgeon: Renford Dills, MD;  Location: [facility] INVASIVE CV LAB;  Service: Cardiovascular;  Laterality: N/A;   DILATION AND CURETTAGE OF UTERUS     ENTEROSCOPY N/A 06/08/2021   Procedure: PUSH ENTEROSCOPY;  Surgeon: Dolores Frame, MD;  [facility];  Service: Gastroenterology;  Laterality: N/A;   ENTEROSCOPY N/A 11/01/2022   Procedure: ENTEROSCOPY;  Surgeon: Dolores Frame, MD;  [facility];  Service: Gastroenterology;  Laterality: N/A;   ENTEROSCOPY N/A 12/01/2022   Procedure: ENTEROSCOPY;  Surgeon: Benancio Deeds, MD;  Location: [facility] ENDOSCOPY;  Service: Gastroenterology;  Laterality: N/A;   ESOPHAGOGASTRODUODENOSCOPY  01/20/2012   Procedure: ESOPHAGOGASTRODUODENOSCOPY (EGD);  Surgeon: Meryl Dare, MD,FACG;  Location: [facility] ENDOSCOPY;  Service: Endoscopy;  Laterality: N/A;    ESOPHAGOGASTRODUODENOSCOPY N/A 03/26/2013   Procedure: ESOPHAGOGASTRODUODENOSCOPY (EGD);  Surgeon: Hilarie Fredrickson, MD;  Location: [facility] ENDOSCOPY;  Service: Endoscopy;  Laterality: N/A;   ESOPHAGOGASTRODUODENOSCOPY N/A 04/30/2015   Procedure: ESOPHAGOGASTRODUODENOSCOPY (EGD);  Surgeon: Malissa Hippo, MD;  [facility];  Service: Endoscopy;  Laterality: N/A;  1pm - moved to 10/20 @ 1:10   ESOPHAGOGASTRODUODENOSCOPY N/A 07/29/2016   Procedure: ESOPHAGOGASTRODUODENOSCOPY (EGD);  Surgeon: Ruffin Frederick, MD;  Location: [facility] ENDOSCOPY;  Service: Gastroenterology;  Laterality: N/A;  enteroscopy   ESOPHAGOGASTRODUODENOSCOPY N/A 09/26/2019   Procedure: ESOPHAGOGASTRODUODENOSCOPY (EGD);  Surgeon: Malissa Hippo, MD;  [facility];  Service: Endoscopy;  Laterality: N/A;  1250   ESOPHAGOGASTRODUODENOSCOPY N/A 08/11/2022   Procedure: ESOPHAGOGASTRODUODENOSCOPY (EGD);  Surgeon: Beverley Fiedler, MD;  Location: [facility] ENDOSCOPY;  Service: Gastroenterology;  Laterality: N/A;   ESOPHAGOGASTRODUODENOSCOPY (  EGD) WITH PROPOFOL N/A 02/05/2021   Procedure: ESOPHAGOGASTRODUODENOSCOPY (EGD) WITH PROPOFOL;  Surgeon: Lanelle Bal, DO;  [facility];  Service: Endoscopy;  Laterality: N/A;   ESOPHAGOGASTRODUODENOSCOPY (EGD) WITH PROPOFOL N/A 08/27/2022   Procedure: ESOPHAGOGASTRODUODENOSCOPY (EGD) WITH PROPOFOL;  Surgeon: Lanelle Bal, DO;  [facility];  Service: Endoscopy;  Laterality: N/A;   GIVENS CAPSULE STUDY N/A 03/07/2019   Procedure: GIVENS CAPSULE STUDY;  Surgeon: Malissa Hippo, MD;  [facility];  Service: Endoscopy;  Laterality: N/A;  7:30   GIVENS CAPSULE STUDY N/A 04/22/2021   Procedure: GIVENS CAPSULE STUDY;  Surgeon: Malissa Hippo, MD;  [facility];  Service: Endoscopy;  Laterality: N/A;  7:30   GIVENS CAPSULE STUDY N/A 08/27/2022   Procedure: GIVENS CAPSULE STUDY;  Surgeon: Lanelle Bal, DO;  [facility];  Service:  Endoscopy;  Laterality: N/A;   HOT HEMOSTASIS N/A 08/11/2022   Procedure: HOT HEMOSTASIS (ARGON PLASMA COAGULATION/BICAP);  Surgeon: Beverley Fiedler, MD;  Location: [facility] ENDOSCOPY;  Service: Gastroenterology;  Laterality: N/A;   HOT HEMOSTASIS N/A 12/01/2022   Procedure: HOT HEMOSTASIS (ARGON PLASMA COAGULATION/BICAP);  Surgeon: Benancio Deeds, MD;  Location: [facility] ENDOSCOPY;  Service: Gastroenterology;  Laterality: N/A;   INSERTION OF DIALYSIS CATHETER N/A 10/05/2020   Procedure: ABORTED TUNNELED DIALYSIS CATHETER PLACEMENT RIGHT INTERNAL JUGULAR VEIN ;  Surgeon: Lucretia Roers, MD;  [facility]: [facility] ORS;  Service: General;  Laterality: N/A;   INTRAOPERATIVE TRANSESOPHAGEAL ECHOCARDIOGRAM  06/13/2012   Procedure: INTRAOPERATIVE TRANSESOPHAGEAL ECHOCARDIOGRAM;  Surgeon: Delight Ovens, MD;  Location: [facility] OR;  Service: Open Heart Surgery;  Laterality: N/A;   IR DIALY SHUNT INTRO NEEDLE/INTRACATH INITIAL W/IMG LEFT Left 10/06/2020   IR FLUORO GUIDE CV LINE RIGHT  06/17/2020   IR FLUORO GUIDE CV LINE RIGHT  11/12/2022   IR GENERIC HISTORICAL  07/26/2016   IR FLUORO GUIDE CV LINE RIGHT 07/26/2016 Berdine Dance, MD [facility]   IR GENERIC HISTORICAL  07/26/2016   IR US GUIDE VASC ACCESS RIGHT 07/26/2016 Berdine Dance, MD [facility]   IR GENERIC HISTORICAL  08/02/2016   IR US GUIDE VASC ACCESS RIGHT 08/02/2016 Berdine Dance, MD [facility]   IR GENERIC HISTORICAL  08/02/2016   IR FLUORO GUIDE CV LINE RIGHT 08/02/2016 Berdine Dance, MD [facility]   IR RADIOLOGY PERIPHERAL GUIDED IV START  03/28/2017   IR REMOVAL TUN CV CATH W/O FL  08/11/2020   IR REMOVAL TUN CV CATH W/O FL  11/15/2022   IR THROMBECTOMY AV FISTULA W/THROMBOLYSIS INC/SHUNT/IMG LEFT Left 06/17/2020   IR US GUIDE VASC ACCESS LEFT  06/17/2020   IR US GUIDE VASC ACCESS RIGHT  03/28/2017   IR US GUIDE VASC ACCESS RIGHT  06/17/2020   IR US GUIDE VASC ACCESS RIGHT  11/12/2022   LEFT HEART CATH AND CORONARY ANGIOGRAPHY N/A 09/20/2016   Procedure: Left  Heart Cath and Coronary Angiography;  Surgeon: Lyn Records, MD;  Location: [facility] INVASIVE CV LAB;  Service: Cardiovascular;  Laterality: N/A;   LEFT HEART CATH AND CORS/GRAFTS ANGIOGRAPHY N/A 10/13/2016   Procedure: Left Heart Cath and Cors/Grafts Angiography;  Surgeon: Lennette Bihari, MD;  [facility];  Service: Cardiovascular;  Laterality: N/A;   LEFT HEART CATH AND CORS/GRAFTS ANGIOGRAPHY N/A 07/13/2018   Procedure: LEFT HEART CATH AND CORS/GRAFTS ANGIOGRAPHY;  Surgeon: Swaziland, Peter M, MD;  Location: [facility] INVASIVE CV LAB;  Service: Cardiovascular;  Laterality: N/A;   LEFT HEART CATH  AND CORS/GRAFTS ANGIOGRAPHY N/A 07/22/2021   Procedure: LEFT HEART CATH AND CORS/GRAFTS ANGIOGRAPHY;  Surgeon: Runell Gess, MD;  [facility] CV LAB;  Service: Cardiovascular;  Laterality: N/A;   LEFT HEART CATHETERIZATION WITH CORONARY ANGIOGRAM N/A 12/15/2011   Procedure: LEFT HEART CATHETERIZATION WITH CORONARY ANGIOGRAM;  Surgeon: Kathleene Hazel, MD;  Location: [facility] CATH LAB;  Service: Cardiovascular;  Laterality: N/A;   LEFT HEART CATHETERIZATION WITH CORONARY ANGIOGRAM N/A 01/10/2012   Procedure: LEFT HEART CATHETERIZATION WITH CORONARY ANGIOGRAM;  Surgeon: Peter M Swaziland, MD;  Location: [facility] CATH LAB;  Service: Cardiovascular;  Laterality: N/A;   LEFT HEART CATHETERIZATION WITH CORONARY ANGIOGRAM N/A 06/08/2012   Procedure: LEFT HEART CATHETERIZATION WITH CORONARY ANGIOGRAM;  Surgeon: Kathleene Hazel, MD;  Location: [facility] CATH LAB;  Service: Cardiovascular;  Laterality: N/A;   LEFT HEART CATHETERIZATION WITH CORONARY/GRAFT ANGIOGRAM N/A 12/10/2013   Procedure: LEFT HEART CATHETERIZATION WITH Isabel Caprice;  Surgeon: Corky Crafts, MD;  Location: [facility] CATH LAB;  Service: Cardiovascular;  Laterality: N/A;   OVARY SURGERY     ovarian cancer   POLYPECTOMY  03/09/2019   Procedure: POLYPECTOMY;  Surgeon: Malissa Hippo, MD;  [facility];  Service: Endoscopy;;   cecal    POLYPECTOMY N/A 09/26/2019   Procedure: DUODENAL POLYPECTOMY;  Surgeon: Malissa Hippo, MD;  [facility];  Service: Endoscopy;  Laterality: N/A;   POLYPECTOMY  08/11/2022   Procedure: POLYPECTOMY;  Surgeon: Beverley Fiedler, MD;  Location: [facility] ENDOSCOPY;  Service: Gastroenterology;;   REVISION OF ARTERIOVENOUS GORETEX GRAFT N/A 02/24/2017   Procedure: REVISION OF ARTERIOVENOUS GORETEX GRAFT (RESECTION);  Surgeon: Renford Dills, MD;  Location: [facility] ORS;  Service: Vascular;  Laterality: N/A;   REVISON OF ARTERIOVENOUS FISTULA Left 06/19/2020   Procedure: REVISION OF LEFT UPPER ARM AV GRAFT WITH INTERPOSITION JUMP GRAFT USING GORE LIMB;  Surgeon: Cephus Shelling, MD;  Location: [facility] OR;  Service: Vascular;  Laterality: Left;   SHUNTOGRAM N/A 10/15/2013   Procedure: Fistulogram;  Surgeon: Nada Libman, MD;  Location: [facility] CATH LAB;  Service: Cardiovascular;  Laterality: N/A;   SUBMUCOSAL TATTOO INJECTION  11/01/2022   Procedure: SUBMUCOSAL TATTOO INJECTION;  Surgeon: Dolores Frame, MD;  [facility];  Service: Gastroenterology;;   THROMBECTOMY / ARTERIOVENOUS GRAFT REVISION  2011   left upper arm   TUBAL LIGATION  1980's   UPPER EXTREMITY ANGIOGRAPHY Bilateral 12/06/2016   Procedure: Upper Extremity Angiography;  Surgeon: Renford Dills, MD;  Location: [facility] INVASIVE CV LAB;  Service: Cardiovascular;  Laterality: Bilateral;   UPPER EXTREMITY INTERVENTION Left 06/06/2017   Procedure: UPPER EXTREMITY INTERVENTION;  Surgeon: Renford Dills, MD;  Location: [facility] INVASIVE CV LAB;  Service: Cardiovascular;  Laterality: Left;   UPPER EXTREMITY VENOGRAPHY Left 10/04/2022   Procedure: UPPER EXTREMITY VENOGRAPHY;  Surgeon: Renford Dills, MD;  Location: [facility] INVASIVE CV LAB;  Service: Cardiovascular;  Laterality: Left;    Family History  Problem Relation Age of Onset   Heart disease Mother        Heart Disease before age 90   Hyperlipidemia  Mother    Hypertension Mother    Diabetes Mother    Heart attack Mother    Heart disease Father        Heart Disease before age 60   Hyperlipidemia Father    Hypertension Father    Diabetes Father    Diabetes Sister    Hypertension Sister    Diabetes Brother  Hyperlipidemia Brother    Heart attack Brother    Hypertension Sister    Heart attack Brother    Colon cancer Child 74   Other Other        noncontributory for early CAD   Esophageal cancer Neg Hx    Liver disease Neg Hx    Kidney disease Neg Hx    Colon polyps Neg Hx     Social History:  reports that she has never smoked. She has never used smokeless tobacco. She reports that she does not drink alcohol and does not use drugs.  Allergies:  Allergies  Allergen Reactions   Amlodipine Swelling   Aspirin Other (See Comments)    High Doses Mess up her stomach; "makes my bowels have blood in them". Takes 81 mg EC Aspirin    Nitrofurantoin Hives   Ranexa [Ranolazine] Other (See Comments)    Myoclonus-hospitalized    Bactrim [Sulfamethoxazole-Trimethoprim] Rash   Iodinated Contrast Media Itching and Other (See Comments)   Iron Itching and Other (See Comments)    "they gave me iron in dialysis; had to give me Benadryl cause I had to have the iron" (05/02/2012)   Tylenol [Acetaminophen] Itching and Other (See Comments)    Makes her feet on fire per pt - can tolerate per pt   Gabapentin Other (See Comments)    Unknown reaction   Sucroferric Oxyhydroxide Other (See Comments)    Unknown   Levaquin [Levofloxacin In D5w] Rash   Levofloxacin Rash and Other (See Comments)   Plavix [Clopidogrel Bisulfate] Rash   Protonix [Pantoprazole Sodium] Rash   Venofer [Ferric Oxide] Itching and Other (See Comments)    Patient reports using Benadryl prior to doses as [facility]    Medications: Prior to Admission:  Medications Prior to Admission  Medication Sig Dispense Refill Last Dose   amiodarone (PACERONE) 200 MG tablet Take  200 mg by mouth daily.      nitroGLYCERIN (NITROSTAT) 0.4 MG SL tablet Place 0.4 mg under the tongue every 5 (five) minutes as needed for chest pain.   unknown   ALPRAZolam (XANAX) 0.5 MG tablet Take 1 tablet (0.5 mg total) by mouth daily as needed for up to 4 doses for anxiety. 4 tablet 0    amiodarone (PACERONE) 100 MG tablet Take 1 tablet (100 mg total) by mouth daily. 30 tablet 0    benzonatate (TESSALON) 100 MG capsule Take 1 capsule (100 mg total) by mouth 3 (three) times daily as needed for cough. 21 capsule 0    calcitRIOL (ROCALTROL) 0.25 MCG capsule Take 7 capsules (1.75 mcg total) by mouth every Monday, Wednesday, and Friday with hemodialysis.      cinacalcet (SENSIPAR) 30 MG tablet Take 1 tablet (30 mg total) by mouth every Monday, Wednesday, and Friday with hemodialysis. 60 tablet     cyanocobalamin (VITAMIN B12) 1000 MCG/ML injection Inject 1,000 mcg into the muscle every 30 (thirty) days.      folic acid (FOLVITE) 1 MG tablet Take 1 mg by mouth daily.      gabapentin (NEURONTIN) 100 MG capsule Take 1 capsule (100 mg total) by mouth every evening. 5 capsule 0    hydrALAZINE (APRESOLINE) 25 MG tablet Take 1 tablet (25 mg total) by mouth 2 (two) times daily. Hold for sbp <110      hydrOXYzine (ATARAX) 25 MG tablet Take 25 mg by mouth every 6 (six) hours as needed for itching.      levETIRAcetam (KEPPRA) 500 MG  tablet Take 1 tablet (500 mg total) by mouth daily. 30 tablet 0    levothyroxine (SYNTHROID) 50 MCG tablet Take 50 mcg by mouth daily.      levothyroxine (SYNTHROID) 75 MCG tablet Take 1 tablet (75 mcg total) by mouth daily at 6 (six) AM. 30 tablet 0    meclizine (ANTIVERT) 25 MG tablet Take 25 mg by mouth every 4 (four) hours as needed for dizziness.      metoprolol tartrate (LOPRESSOR) 25 MG tablet Take 0.5 tablets (12.5 mg total) by mouth 2 (two) times daily. 30 tablet 0    multivitamin (RENA-VIT) TABS tablet Take 1 tablet by mouth daily.      octreotide (SANDOSTATIN LAR) 20 MG  injection Inject 20 mg into the muscle every 28 (twenty-eight) days. 1 each 0    olmesartan (BENICAR) 20 MG tablet Take 20 mg by mouth daily.      omeprazole (PRILOSEC) 20 MG capsule Take 1 capsule (20 mg total) by mouth 2 (two) times daily before a meal. (Patient taking differently: Take 20 mg by mouth as needed (acid reflux).) 60 capsule 0    omeprazole (PRILOSEC) 40 MG capsule Take 40 mg by mouth daily.      ondansetron (ZOFRAN) 4 MG tablet Take 4 mg by mouth every 8 (eight) hours as needed.      ondansetron (ZOFRAN-ODT) 4 MG disintegrating tablet Take 4 mg by mouth every 8 (eight) hours as needed.      rosuvastatin (CRESTOR) 10 MG tablet Take 1 tablet by mouth once daily 90 tablet 0     Results for orders placed or performed during the hospital encounter of 05/31/23 (from the past 48 hour(s))  Basic metabolic panel     Status: Abnormal   Collection Time: 05/31/23 12:56 PM  Result Value Ref Range   Sodium 131 (L) 135 - 145 mmol/L   Potassium 2.8 (L) 3.5 - 5.1 mmol/L   Chloride 97 (L) 98 - 111 mmol/L   CO2 25 22 - 32 mmol/L   Glucose, Bld 175 (H) 70 - 99 mg/dL    Comment: Glucose reference range applies only to samples taken after fasting for at least 8 hours.   BUN 13 8 - 23 mg/dL   Creatinine, Ser 1.61 (H) 0.44 - 1.00 mg/dL   Calcium 8.1 (L) 8.9 - 10.3 mg/dL   GFR, Estimated 9 (L) >60 mL/min    Comment: (NOTE) Calculated using the CKD-EPI Creatinine Equation (2021)    Anion gap 9 5 - 15    Comment: Performed at [facility], [address], [place], [place] [address]  CBC     Status: Abnormal   Collection Time: 05/31/23 12:56 PM  Result Value Ref Range   WBC 4.1 4.0 - 10.5 K/uL   RBC 1.98 (L) 3.87 - 5.11 MIL/uL   Hemoglobin 7.0 (L) 12.0 - 15.0 g/dL    Comment: REPEATED TO VERIFY   HCT 20.7 (L) 36.0 - 46.0 %   MCV 104.5 (H) 80.0 - 100.0 fL   MCH 35.4 (H) 26.0 - 34.0 pg   MCHC 33.8 30.0 - 36.0 g/dL   RDW Not Measured 54.0 - 15.5 %   Platelets 236 150 - 400 K/uL   nRBC 0.0  0.0 - 0.2 %    Comment: Performed at [facility], [address] [place], [place] [address]  Magnesium     Status: Abnormal   Collection Time: 05/31/23 12:56 PM  Result Value Ref Range   Magnesium 1.5 (  L) 1.7 - 2.4 mg/dL    Comment: Performed at [facility], [address], [place], [place] [address]  Troponin I (High Sensitivity)     Status: Abnormal   Collection Time: 05/31/23 12:56 PM  Result Value Ref Range   Troponin I (High Sensitivity) 43 (H) <18 ng/L    Comment: (NOTE) Elevated high sensitivity troponin I (hsTnI) values and significant  changes across serial measurements may suggest ACS but many other  chronic and acute conditions are known to elevate hsTnI results.  Refer to the "Links" section for chest pain algorithms and additional  guidance. Performed at [facility], [address], [place], [place] [address]   Type and screen [facility]     Status: None (Preliminary result)   Collection Time: 05/31/23 12:56 PM  Result Value Ref Range   ABO/RH(D)      O POS Performed at [facility], [address] [place], [place] [address]    Antibody Screen      POS Performed at [facility], [address] [place], [place] [address]    Sample Expiration      06/03/2023,2359 Performed at [facility], [address], [place], [place] [address]    Antibody Identification NO CLINICALLY SIGNIFICANT ANTIBODY IDENTIFIED    DAT, IgG NEG    Unit Number [id number]    Blood Component Type RED CELLS,LR    Unit division 00    Status of Unit ALLOCATED    Donor AG Type NEGATIVE FOR KIDD B ANTIGEN    Transfusion Status OK TO TRANSFUSE    Crossmatch Result COMPATIBLE    Unit Number [id number]    Blood Component Type RED CELLS,LR    Unit division 00    Status of Unit ISSUED    Donor AG Type NEGATIVE FOR KIDD B ANTIGEN    Transfusion Status OK TO TRANSFUSE    Crossmatch Result COMPATIBLE   Hepatitis B surface antigen     Status: None   Collection Time: 05/31/23 12:56  PM  Result Value Ref Range   Hepatitis B Surface Ag NON REACTIVE NON REACTIVE    Comment: Performed at [facility], [address] [place], [place] [address]  Prepare RBC (crossmatch)     Status: None   Collection Time: 05/31/23  2:58 PM  Result Value Ref Range   Order Confirmation      ORDER PROCESSED BY BLOOD BANK Performed at [facility], [address] [place], [place] [address]   Occult blood, poc device     Status: None   Collection Time: 05/31/23  3:51 PM  Result Value Ref Range   Fecal Occult Bld NEGATIVE NEGATIVE  Resp panel by RT-PCR (RSV, Flu A&B, Covid) Anterior Nasal Swab     Status: None   Collection Time: 05/31/23  5:41 PM   Specimen: Anterior Nasal Swab  Result Value Ref Range   SARS Coronavirus 2 by RT PCR NEGATIVE NEGATIVE    Comment: (NOTE) SARS-CoV-2 target nucleic acids are NOT DETECTED.  The SARS-CoV-2 RNA is generally detectable in upper respiratory specimens during the acute phase of infection. The lowest concentration of SARS-CoV-2 viral copies this assay can detect is 138 copies/mL. A negative result does not preclude SARS-Cov-2 infection and should not be used as the sole basis for treatment or other patient management decisions. A negative result may occur with  improper specimen collection/handling, submission of specimen other than nasopharyngeal swab, presence of viral mutation(s) within the areas targeted by this  assay, and inadequate number of viral copies(<138 copies/mL). A negative result must be combined with clinical observations, patient history, and epidemiological information. The expected result is Negative.  Fact Sheet for Patients:  BloggerCourse.com  Fact Sheet for Healthcare Providers:  SeriousBroker.it  This test is no t yet approved or cleared by the [place] FDA and  has been authorized for detection and/or diagnosis of SARS-CoV-2 by FDA under an Emergency Use  Authorization (EUA). This EUA will remain  in effect (meaning this test can be used) for the duration of the COVID-19 declaration under Section 564(b)(1) of the Act, 21 U.S.C.section 360bbb-3(b)(1), unless the authorization is terminated  or revoked sooner.       Influenza A by PCR NEGATIVE NEGATIVE   Influenza B by PCR NEGATIVE NEGATIVE    Comment: (NOTE) The Xpert Xpress SARS-CoV-2/FLU/RSV plus assay is intended as an aid in the diagnosis of influenza from Nasopharyngeal swab specimens and should not be used as a sole basis for treatment. Nasal washings and aspirates are unacceptable for Xpert Xpress SARS-CoV-2/FLU/RSV testing.  Fact Sheet for Patients: BloggerCourse.com  Fact Sheet for Healthcare Providers: SeriousBroker.it  This test is not yet approved or cleared by the [place] FDA and has been authorized for detection and/or diagnosis of SARS-CoV-2 by FDA under an Emergency Use Authorization (EUA). This EUA will remain in effect (meaning this test can be used) for the duration of the COVID-19 declaration under Section 564(b)(1) of the Act, 21 U.S.C. section 360bbb-3(b)(1), unless the authorization is terminated or revoked.     Resp Syncytial Virus by PCR NEGATIVE NEGATIVE    Comment: (NOTE) Fact Sheet for Patients: BloggerCourse.com  Fact Sheet for Healthcare Providers: SeriousBroker.it  This test is not yet approved or cleared by the [place] FDA and has been authorized for detection and/or diagnosis of SARS-CoV-2 by FDA under an Emergency Use Authorization (EUA). This EUA will remain in effect (meaning this test can be used) for the duration of the COVID-19 declaration under Section 564(b)(1) of the Act, 21 U.S.C. section 360bbb-3(b)(1), unless the authorization is terminated or revoked.  Performed at [facility], [address] [place], [place]  [address]   Prepare RBC (crossmatch)     Status: None   Collection Time: 05/31/23  6:18 PM  Result Value Ref Range   Order Confirmation      ORDER PROCESSED BY BLOOD BANK Performed at [facility], [address] [place], [place] [address]    *Note: Due to a large number of results and/or encounters for the requested time period, some results have not been displayed. A complete set of results can be found in Results Review.    DG Chest Portable 1 View  Result Date: 05/31/2023 CLINICAL DATA:  Weakness.  End-stage renal disease on dialysis. EXAM: PORTABLE CHEST 1 VIEW COMPARISON:  Chest radiograph dated May 13, 2023. FINDINGS: Stable cardiomegaly. Stable left-sided central venous dialysis catheter. Prior median sternotomy and CABG. Similar small bilateral layering pleural effusions with basilar atelectasis. Diffuse interstitial prominence. No pneumothorax. No acute osseous abnormality. IMPRESSION: Cardiomegaly with similar diffuse interstitial edema and small bilateral pleural effusions. Electronically Signed   By: Hart Robinsons M.D.   On: 05/31/2023 17:46    ROS: Not a great review of systems since she just got some Ativan- but able to say she's not in any pain Blood pressure (!) 151/52, pulse (!) 55, temperature 97.9 F (36.6 C), resp. rate 16, height 5\' 2"  (1.575 m), weight 52.2 kg, SpO2 94%. GEN  NAD, intermittently drifting off to sleep on the phone HEENT EOMI PERRL NECK + JVD PULM bibasilar crackles CV RRR ABD soft EXT no LE edema NEURO drifting off to sleep SKIN no rashes ACCESS: L TDC  Assessment/Plan: 1 weakness/ symptomatic anemia:  Will get blood transfusion today- has antibodies, will take some time to crossmatch 2 ESRD: Will do HD off schedule today as she missed- 4K  bath.  No heparin. 3 Hypertension/ volume: CXR with bilateral pleural effusions- will UF as tolerated 4. Anemia of ESRD: Will order Aranesp for today 5. Metabolic Bone Disease: calcitriol with HD,  binders as well 6.  Dispo: pending  Bufford Buttner 06/01/2023, 9:34 AM

## 2023-06-01 NOTE — Evaluation (Signed)
Physical Therapy Evaluation Patient Details Name: Shawna [name] MRN: [id number] DOB: 01-Dec-1940 Today's Date: 06/01/2023  History of Present Illness  Shawna [name]  is a 82 y.o. female,  with medical history significant for recurrent GI bleeds possibly due to AVMs,  paroxysmal atrial fibrillation no longer on anticoagulation due to recurrent GI bleed, end-stage renal disease on hemodialysis Monday/ Wednesday/Fridays, seizure disorder, and coronary artery disease status post CABG and PCI .  -Jent was brought by her husband due to weakness, husband report patient with progressive weakness over the last week, reports she went for dialysis on Monday, but she was too weak today to go for dialysis, she has been having some congestion, cough, and dyspnea, no melena, no vomiting, no coffee-ground emesis, she is not on any anticoagulation, no fever, no chills.  But reports this feels typical for her when she has her anemia which she usually gets with blood transfusion.  -In ED her workup was significant for hemoglobin of 7, but her Hemoccult was negative, she was on room air, labs were significant for low potassium at 2.8, and magnesium of 1.5, given she missed her dialysis today, and need for transfusion [facility] hospitalist consulted to admit.   Clinical Impression  Patient demonstrates slow labored movement for sitting up at bedside, once seated had frequent episodes of lethargy requiring repeated verbal/tactile cues for following instructions, very unsteady on feet and limited to a few side steps before having to sit due to fall risk.  Patient will benefit from continued skilled physical therapy in hospital and recommended venue below to increase strength, balance, endurance for safe ADLs and gait.          If plan is discharge home, recommend the following: A lot of help with bathing/dressing/bathroom;A lot of help with walking and/or transfers;Help with stairs or ramp for entrance;Assistance with  cooking/housework   Can travel by private vehicle   Yes    Equipment Recommendations None recommended by PT  Recommendations for Other Services       Functional Status Assessment Patient has had a recent decline in their functional status and demonstrates the ability to make significant improvements in function in a reasonable and predictable amount of time.     Precautions / Restrictions Precautions Precautions: Fall Restrictions Weight Bearing Restrictions: No      Mobility  Bed Mobility Overal bed mobility: Needs Assistance Bed Mobility: Supine to Sit     Supine to sit: Min assist     General bed mobility comments: increased time, labored movement    Transfers Overall transfer level: Needs assistance Equipment used: Rolling walker (2 wheels) Transfers: Sit to/from Stand, Bed to chair/wheelchair/BSC Sit to Stand: Mod assist   Step pivot transfers: Mod assist       General transfer comment: slow labored movement with flexed trunk    Ambulation/Gait Ambulation/Gait assistance: Mod assist Gait Distance (Feet): 5 Feet Assistive device: Rolling walker (2 wheels) Gait Pattern/deviations: Decreased step length - right, Decreased step length - left, Decreased stride length, Trunk flexed Gait velocity: slow     General Gait Details: limited to a few slow labored side steps before having to sit due to fatigue, weakness and fall risk  Stairs            Wheelchair Mobility     Tilt Bed    Modified Rankin (Stroke Patients Only)       Balance Overall balance assessment: Needs assistance Sitting-balance support: Feet supported, No upper extremity supported Sitting balance-Leahy Scale:  Poor Sitting balance - Comments: seated at EOB   Standing balance support: Bilateral upper extremity supported, During functional activity, Reliant on assistive device for balance Standing balance-Leahy Scale: Poor Standing balance comment: using RW                              Pertinent Vitals/Pain Pain Assessment Pain Assessment: No/denies pain    Home Living Family/patient expects to be discharged to:: Private residence Living Arrangements: Spouse/significant other Available Help at Discharge: Family;Available 24 hours/day Type of Home: House Home Access: Stairs to enter Entrance Stairs-Rails: Right Entrance Stairs-Number of Steps: 2   Home Layout: One level Home Equipment: Rollator (4 wheels);Cane - single point;Shower seat;BSC/3in1 Additional Comments: Per chart review. Pt not oriented today and very lethargic.    Prior Function Prior Level of Function : Needs assist       Physical Assist : Mobility (physical);ADLs (physical)     Mobility Comments: Pt typically uses her hurri-cane to ambulate in house but will occasionally use rollator.  Spouse assist with stairs. (per chart review) ADLs Comments: Spouse provides min A for ADLs and total assist for IADLs. Likes to garden (per chart review due to pt's lack or orientation and lethargy today)     Extremity/Trunk Assessment   Upper Extremity Assessment Upper Extremity Assessment: Defer to OT evaluation    Lower Extremity Assessment Lower Extremity Assessment: Generalized weakness    Cervical / Trunk Assessment Cervical / Trunk Assessment: Kyphotic  Communication   Communication Communication: No apparent difficulties Cueing Techniques: Verbal cues;Tactile cues  Cognition Arousal: Lethargic, Suspect due to medications Behavior During Therapy: WFL for tasks assessed/performed Overall Cognitive Status: No family/caregiver present to determine baseline cognitive functioning                                 General Comments: lethargy possibley due to medications        General Comments      Exercises     Assessment/Plan    PT Assessment Patient needs continued PT services  PT Problem List Decreased strength;Decreased activity tolerance;Decreased  balance;Decreased mobility       PT Treatment Interventions DME instruction;Gait training;Stair training;Functional mobility training;Therapeutic activities;Therapeutic exercise;Balance training;Patient/family education    PT Goals (Current goals can be found in the Care Plan section)  Acute Rehab PT Goals Patient Stated Goal: return home PT Goal Formulation: With patient Time For Goal Achievement: 06/15/23 Potential to Achieve Goals: Good    Frequency Min 3X/week     Co-evaluation PT/OT/SLP Co-Evaluation/Treatment: Yes Reason for Co-Treatment: To address functional/ADL transfers PT goals addressed during session: Mobility/safety with mobility;Balance;Proper use of DME OT goals addressed during session: ADL's and self-care       AM-PAC PT "6 Clicks" Mobility  Outcome Measure Help needed turning from your back to your side while in a flat bed without using bedrails?: A Lot Help needed moving from lying on your back to sitting on the side of a flat bed without using bedrails?: A Lot Help needed moving to and from a bed to a chair (including a wheelchair)?: A Lot Help needed standing up from a chair using your arms (e.g., wheelchair or bedside chair)?: A Lot Help needed to walk in hospital room?: A Lot Help needed climbing 3-5 steps with a railing? : Total 6 Click Score: 11    End of Session   Activity  Tolerance: Patient tolerated treatment well;Patient limited by fatigue Patient left: in chair;with call bell/phone within reach;with chair alarm set Nurse Communication: Mobility status PT Visit Diagnosis: Unsteadiness on feet (R26.81);Other abnormalities of gait and mobility (R26.89);Muscle weakness (generalized) (M62.81)    Time: 0840-0900 PT Time Calculation (min) (ACUTE ONLY): 20 min   Charges:   PT Evaluation $PT Eval Moderate Complexity: 1 Mod PT Treatments $Therapeutic Activity: 8-22 mins PT General Charges $$ ACUTE PT VISIT: 1 Visit         12:18 PM,  06/01/23 Ocie Bob, MPT Physical Therapist with [facility] [phone] office [phone] mobile phone

## 2023-06-01 NOTE — NC FL2 (Signed)
[place] MEDICAID FL2 LEVEL OF CARE FORM     IDENTIFICATION  Patient Name: Shawna [name] Birthdate: 1940-09-29 Sex: female Admission Date (Current Location): 05/31/2023  [facility] and [place] Number:  [employer] and Address:  [facility],  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   Acute blood loss anemia 01/27/2023   Paroxysmal atrial flutter (HCC) 12/14/2022   Pressure injury of skin 12/01/2022   Rectal bleeding 11/30/2022   Generalized abdominal pain 11/10/2022   Ileus (HCC) 11/04/2022   Periumbilical abdominal pain 11/04/2022   Macrocytic anemia 10/29/2022   Anemia of chronic disease 10/29/2022   Upper GI bleed 10/29/2022   Iron deficiency anemia due to chronic blood loss 08/26/2022   Benign neoplasm of transverse colon 08/11/2022   Benign neoplasm of descending colon 08/11/2022   Benign neoplasm of sigmoid colon 08/11/2022   Angiodysplasia of duodenum 08/11/2022   Delirium 04/22/2022   History of arteriovenous malformation (AVM) 04/22/2022   Acute metabolic encephalopathy 11/09/2021   NSTEMI, initial episode of care (HCC) 10/19/2021   Acute on chronic blood loss anemia 09/09/2021   Ischemic cardiomyopathy    Atrial fibrillation, chronic (HCC) 05/07/2021   Neuropathy 04/23/2021   Anemia associated with chronic renal failure 04/13/2021   Left knee pain 03/15/2021   Moderate protein-calorie malnutrition (HCC) 02/27/2021   Hypokalemia 02/27/2021   COVID-19 virus infection  02/03/2021   Leukocytosis 02/03/2021   Acquired hypothyroidism 02/03/2021   Myositis 12/03/2020   Ataxia 12/02/2020   Diabetes mellitus type 2 in nonobese (HCC) 11/10/2020   Failure to thrive in adult 11/09/2020   Dialysis AV fistula malfunction, initial encounter (HCC)    Jugular vein occlusion, right (HCC)    Failure of surgically constructed arteriovenous fistula (HCC) 10/03/2020   Myoclonus 08/31/2020   Clotted renal dialysis AV graft, initial encounter (HCC)    Hemodialysis-associated hypotension    Irritable bowel syndrome 02/25/2020   Adenomatous duodenal polyp 09/10/2019   History of GI bleed 09/10/2019   Hand steal syndrome (HCC) 08/01/2017   CAD (coronary artery disease) 06/05/2017   Intestinal ischemia (HCC)    Complication of vascular access for dialysis 03/19/2017   Preoperative clearance 01/25/2017   H/O non-ST elevation myocardial infarction (NSTEMI) 10/24/2016   Non-ST elevation (NSTEMI) myocardial infarction [facility])    Heme positive stool    Cardiac arrest [facility])    Palliative care encounter    Goals of care, counseling/discussion    Flash pulmonary edema (HCC) 04/06/2016   History of colon cancer 01/27/2016   History of ovarian cancer 01/27/2016   PAF (paroxysmal atrial fibrillation) (HCC) 10/14/2015   Malignant neoplasm of right ovary (HCC) 10/14/2015   Wide-complex tachycardia 09/08/2015   SVT (supraventricular tachycardia) (HCC) 09/08/2015   Essential hypertension    Dyspnea    Constipation 03/12/2013   Abdominal wall pain in left flank 03/12/2013   Occlusion and stenosis of carotid artery without mention of cerebral infarction 01/24/2013   Hx of CABG 07/05/2012   Carotid artery disease (HCC)  07/05/2012   Mitral regurgitation 06/12/2012   Non-STEMI (non-ST elevated myocardial infarction) (HCC) 06/08/2012   Chronic combined systolic and diastolic CHF (congestive heart failure) (HCC) 05/02/2012   AVM (arteriovenous malformation) of small bowel, acquired  with hemorrhage 01/20/2012   GERD (gastroesophageal reflux disease) 01/09/2012   Mixed hyperlipidemia 01/05/2012   Atherosclerotic heart disease of native coronary artery without angina pectoris 12/16/2011   Anxiety disorder 05/04/2011   End-stage renal disease on hemodialysis (HCC) 04/29/2011   Gout 04/29/2011    Orientation RESPIRATION BLADDER Height & Weight     Self, Place, Situation  Normal Continent Weight: 115 lb (52.2 kg) Height:  5\' 2"  (157.5 cm)  BEHAVIORAL SYMPTOMS/MOOD NEUROLOGICAL BOWEL NUTRITION STATUS      Continent Diet (see dc summary)  AMBULATORY STATUS COMMUNICATION OF NEEDS Skin   Extensive Assist Verbally Normal                       Personal Care Assistance Level of Assistance  Bathing, Feeding, Dressing Bathing Assistance: Limited assistance Feeding assistance: Independent Dressing Assistance: Limited assistance     Functional Limitations Info  Sight, Hearing, Speech Sight Info: Adequate Hearing Info: Adequate Speech Info: Adequate    SPECIAL CARE FACTORS FREQUENCY  PT (By licensed PT), OT (By licensed OT)     PT Frequency: 5x week OT Frequency: 5x week            Contractures Contractures Info: Not present    Additional Factors Info  Code Status, Allergies Code Status Info: Full Allergies Info: Amlodipine, Aspirin, Nitrofurantoin, Ranexa (Ranolazine), Bactrim (Sulfamethoxazole-trimethoprim), Iodinated Contrast Media, Iron, Tylenol (Acetaminophen), Gabapentin, Sucroferric Oxyhydroxide, Levaquin (Levofloxacin In D5w), Levofloxacin, Plavix (Clopidogrel Bisulfate), Protonix (Pantoprazole Sodium), Venofer (Ferric Oxide)           Current Medications (06/01/2023):  This is the current hospital active medication list Current Facility-Administered Medications  Medication Dose Route Frequency Provider Last Rate Last Admin   0.9 %  sodium chloride infusion (Manually program via Guardrails IV Fluids)   Intravenous Once Elgergawy, Leana Roe,  MD       0.9 %  sodium chloride infusion (Manually program via Guardrails IV Fluids)   Intravenous Once Elgergawy, Leana Roe, MD       albuterol (PROVENTIL) (2.5 MG/3ML) 0.083% nebulizer solution 2.5 mg  2.5 mg Nebulization Q2H PRN Elgergawy, Leana Roe, MD       ALPRAZolam Prudy Feeler) tablet 0.5 mg  0.5 mg Oral Daily PRN Elgergawy, Leana Roe, MD   0.5 mg at 06/01/23 0314   amiodarone (PACERONE) tablet 100 mg  100 mg Oral Daily Elgergawy, Leana Roe, MD   100 mg at 06/01/23 1048   Chlorhexidine Gluconate Cloth 2 % PADS 6 each  6 each Topical Q0600 Swayze, Ava, DO       Chlorhexidine Gluconate Cloth 2 % PADS 6 each  6 each Topical Q0600 Bufford Buttner, MD   6 each at 06/01/23 0930   Darbepoetin Alfa (ARANESP) injection 200 mcg  200 mcg Subcutaneous Once Bufford Buttner, MD       folic acid (FOLVITE) tablet 1 mg  1 mg Oral Daily Elgergawy, Leana Roe, MD   1 mg at 06/01/23 1035   gabapentin (NEURONTIN) capsule 100 mg  100 mg Oral QPM Elgergawy, Leana Roe, MD       hydrALAZINE (APRESOLINE) injection 5 mg  5 mg Intravenous Q4H PRN Elgergawy, Leana Roe, MD       hydrALAZINE (APRESOLINE) tablet 25 mg  25 mg Oral  BID Elgergawy, Leana Roe, MD   25 mg at 05/31/23 2151   irbesartan (AVAPRO) tablet 37.5 mg  37.5 mg Oral Daily Elgergawy, Leana Roe, MD   37.5 mg at 06/01/23 1048   levETIRAcetam (KEPPRA) tablet 500 mg  500 mg Oral Daily Elgergawy, Leana Roe, MD   500 mg at 06/01/23 1036   levothyroxine (SYNTHROID) tablet 50 mcg  50 mcg Oral Q0600 Elgergawy, Leana Roe, MD   50 mcg at 06/01/23 0438   ondansetron (ZOFRAN) injection 4 mg  4 mg Intravenous Q6H PRN Zierle-Ghosh, Asia B, DO       pantoprazole (PROTONIX) EC tablet 40 mg  40 mg Oral Daily Elgergawy, Leana Roe, MD   40 mg at 06/01/23 1035   rosuvastatin (CRESTOR) tablet 10 mg  10 mg Oral Daily Elgergawy, Leana Roe, MD   10 mg at 06/01/23 1035     Discharge Medications: Please see discharge summary for a list of discharge medications.  Relevant Imaging  Results:  Relevant Lab Results:   Additional Information SSN: [id number]  Davita [name] 10:30 arrival time  Elliot Gault, LCSW

## 2023-06-01 NOTE — Progress Notes (Signed)
Progress Note   Patient: Shawna [name] EAV:[id number] DOB: September 23, 1940 DOA: 05/31/2023     0 DOS: the patient was seen and examined on 06/01/2023   Brief hospital course: The patient is a 82 yr old woman who presented to [facility] on 05/31/2023 with complaints of progressive weakness over the last week. She was unable to complete dialysis on Monday due to weakness. She complained of some congestion, cough, and dyspnea. She denied any sign of GI bleed. In the ED she was found to have a hemoglobin of 7, but with a negative hemoccult.  Her potassium was 2.8 and was supplemented. Magnesisum was 1.5 and was supplemented. She missed dialysis on Wednesday. Nephrology was consulted. She has received 1 unit of PRBC's in transfusion.  Assessment and Plan:  Active Problems:   Acute on chronic blood loss anemia   Acquired hypothyroidism   Chronic combined systolic and diastolic CHF (congestive heart failure) (HCC)   CAD (coronary artery disease)   Anemia of chronic disease   Symptomatic anemia.   Weakness-deconditioning due to Symptomatic anemia Anemia of chronic kidney disease -Presented with generalized weakness, deconditioning, she missed her HD today because of that -Significant for anemia hemoglobin of 7, her Hemoccult is negative -Patient was transfused with 1 unit PRBC. After the transfusion, her hemoglobin is again 7.0. Will     recheck an iron panel. Most recent Fe 136 in June.    No signs of intravascular hemolysis.  -Place potassium and magnesium -Will consult PT -Procrit  and iron per renal if needed -Consider consult with hematology if iron panel isn't remarkable and she does not improve with another unit of PRBC's.   ESRD -Renal Consulted. Will dialyze today.  Atrial tachycardia/paroxysmal atrial fibrillation/flutter/?WCT: -Has been seen by cardiology in the past, continue rhythm control medication with amiodarone and metoprolol -Not on anticoagulation secondary to recurrent GI bleed    Chronic systolic CHF with EF 30-35% CAD status post CABG with residual small vessel disease -Has any chest pain or dyspnea, but reports generalized weakness, will receive a unit PRBC -Volume management with dialysis -Aspirin for anticoagulation secondary  -Not on aspirin or anticoagulation given recurrent GI bleed requiring multiple transfusions    History of AVM -Denies any GI bleeds, she is Hemoccult negative in ED   hypertension -Continue with home medications, will add as needed hydralazine   Hyperlipidemia: Continue with statin   Type diabetes mellitus  -This is diet controlled   Acquired hypothyroidism  -With Synthroid   DVT Prophylaxis SCDs    AM Labs Ordered, also please review Full Orders      Subjective: The patient is awake and alert. She states that she still feels weak.   Physical Exam: Vitals:   06/01/23 1430 06/01/23 1445 06/01/23 1500 06/01/23 1530  BP: (!) 140/67 125/61 120/66 (!) 137/59  Pulse:      Resp: 19 11 20 17   Temp:      TempSrc:      SpO2:      Weight:      Height:       Exam:  Constitutional:  The patient is awake, alert, and oriented x 3. No acute distress. Respiratory:  No increased work of breathing. No wheezes, rales, or rhonchi No tactile fremitus Cardiovascular:  Regular rate and rhythm No murmurs, ectopy, or gallups. No lateral PMI. No thrills. Abdomen:  Abdomen is soft, non-tender, non-distended No hernias, masses, or organomegaly Normoactive bowel sounds.  Musculoskeletal:  No cyanosis, clubbing, or edema Skin:  No rashes, lesions, ulcers palpation of skin: no induration or nodules Neurologic:  CN 2-12 intact Sensation all 4 extremities intact Psychiatric:  Mental status Mood, affect appropriate Orientation to person, place, time  judgment and insight appear intact  Family Communication: None available.  Disposition: Status is: Observation The patient remains OBS appropriate and will d/c before 2  midnights.  Planned Discharge Destination: Home    Time spent: 34 minutes  Author: Macil Crady, DO 06/01/2023 3:51 PM  For on call review [email or web address].

## 2023-06-01 NOTE — Progress Notes (Signed)
Pt has slept most of this shift. Order placed for pt to receive blood transfusion, IV in upper right arm no longer patent. Ultrasound was used with no success. MD notified.

## 2023-06-01 NOTE — TOC Initial Note (Signed)
Transition of Care [facility]) - Initial/Assessment Note    Patient Details  Name: Shawna [name] MRN: [id number] Date of Birth: Apr 24, 1941  Transition of Care [facility]) CM/SW Contact:    Elliot Gault, LCSW Phone Number: 06/01/2023, 11:26 AM  Clinical Narrative:                  Pt admitted from home. Pt well known to TOC from previous admissions. PT/OT recommending SNF.  Spoke with pt's husband today as pt quite lethargic. Reviewed SNF recommendation and husband feels that pt will be agreeable. CMS provider options reviewed. Will refer as requested. Pt will need auth.   Will follow.  Expected Discharge Plan: Skilled Nursing Facility Barriers to Discharge: Continued Medical Work up   Patient Goals and CMS Choice Patient states their goals for this hospitalization and ongoing recovery are:: get better CMS Medicare.gov Compare Post Acute Care list provided to:: Patient Represenative (must comment) Choice offered to / list presented to : Spouse Vaughn ownership interest in [facility].provided to:: Spouse    Expected Discharge Plan and Services In-house Referral: Clinical Social Work   Post Acute Care Choice: Skilled Nursing Facility Living arrangements for the past 2 months: Single Family Home                                      Prior Living Arrangements/Services Living arrangements for the past 2 months: Single Family Home Lives with:: Spouse Patient language and need for interpreter reviewed:: Yes Do you feel safe going back to the place where you live?: Yes      Need for Family Participation in Patient Care: Yes (Comment) Care giver support system in place?: Yes (comment) Current home services: DME, Home PT, Home RN Criminal Activity/Legal Involvement Pertinent to Current Situation/Hospitalization: No - Comment as needed  Activities of Daily Living   ADL Screening (condition at time of admission) Independently performs ADLs?: No Does the patient have  a NEW difficulty with bathing/dressing/toileting/self-feeding that is expected to last >3 days?: Yes (Initiates electronic notice to provider for possible OT consult) Does the patient have a NEW difficulty with getting in/out of bed, walking, or climbing stairs that is expected to last >3 days?: Yes (Initiates electronic notice to provider for possible PT consult) Does the patient have a NEW difficulty with communication that is expected to last >3 days?: No Is the patient deaf or have difficulty hearing?: No Does the patient have difficulty seeing, even when wearing glasses/contacts?: No Does the patient have difficulty concentrating, remembering, or making decisions?: No  Permission Sought/Granted Permission sought to share information with : Facility Industrial/product designer granted to share information with : Yes, Verbal Permission Granted     Permission granted to share info w AGENCY: snfs        Emotional Assessment Appearance:: Appears stated age   Affect (typically observed): Unable to Assess Orientation: : Oriented to Self, Oriented to Place Alcohol / Substance Use: Not Applicable Psych Involvement: No (comment)  Admission diagnosis:  Symptomatic anemia [D64.9] Patient Active Problem List   Diagnosis Date Noted   Symptomatic anemia 03/08/2023   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   Acute blood loss anemia 01/27/2023   Paroxysmal atrial flutter (HCC) 12/14/2022   Pressure injury of skin 12/01/2022   Rectal bleeding 11/30/2022   Generalized abdominal pain 11/10/2022   Ileus (HCC) 11/04/2022   Periumbilical abdominal pain 11/04/2022  Macrocytic anemia 10/29/2022   Anemia of chronic disease 10/29/2022   Upper GI bleed 10/29/2022   Iron deficiency anemia due to chronic blood loss 08/26/2022   Benign neoplasm of transverse colon 08/11/2022   Benign neoplasm of descending colon 08/11/2022   Benign neoplasm of sigmoid colon 08/11/2022    Angiodysplasia of duodenum 08/11/2022   Delirium 04/22/2022   History of arteriovenous malformation (AVM) 04/22/2022   Acute metabolic encephalopathy 11/09/2021   NSTEMI, initial episode of care (HCC) 10/19/2021   Acute on chronic blood loss anemia 09/09/2021   Ischemic cardiomyopathy    Atrial fibrillation, chronic (HCC) 05/07/2021   Neuropathy 04/23/2021   Anemia associated with chronic renal failure 04/13/2021   Left knee pain 03/15/2021   Moderate protein-calorie malnutrition (HCC) 02/27/2021   Hypokalemia 02/27/2021   COVID-19 virus infection 02/03/2021   Leukocytosis 02/03/2021   Acquired hypothyroidism 02/03/2021   Myositis 12/03/2020   Ataxia 12/02/2020   Diabetes mellitus type 2 in nonobese (HCC) 11/10/2020   Failure to thrive in adult 11/09/2020   Dialysis AV fistula malfunction, initial encounter (HCC)    Jugular vein occlusion, right (HCC)    Failure of surgically constructed arteriovenous fistula (HCC) 10/03/2020   Myoclonus 08/31/2020   Clotted renal dialysis AV graft, initial encounter (HCC)    Hemodialysis-associated hypotension    Irritable bowel syndrome 02/25/2020   Adenomatous duodenal polyp 09/10/2019   History of GI bleed 09/10/2019   Hand steal syndrome (HCC) 08/01/2017   CAD (coronary artery disease) 06/05/2017   Intestinal ischemia (HCC)    Complication of vascular access for dialysis 03/19/2017   Preoperative clearance 01/25/2017   H/O non-ST elevation myocardial infarction (NSTEMI) 10/24/2016   Non-ST elevation (NSTEMI) myocardial infarction [facility])    Heme positive stool    Cardiac arrest [facility])    Palliative care encounter    Goals of care, counseling/discussion    Flash pulmonary edema (HCC) 04/06/2016   History of colon cancer 01/27/2016   History of ovarian cancer 01/27/2016   PAF (paroxysmal atrial fibrillation) (HCC) 10/14/2015   Malignant neoplasm of right ovary (HCC) 10/14/2015   Wide-complex tachycardia 09/08/2015   SVT (supraventricular  tachycardia) (HCC) 09/08/2015   Essential hypertension    Dyspnea    Constipation 03/12/2013   Abdominal wall pain in left flank 03/12/2013   Occlusion and stenosis of carotid artery without mention of cerebral infarction 01/24/2013   Hx of CABG 07/05/2012   Carotid artery disease (HCC) 07/05/2012   Mitral regurgitation 06/12/2012   Non-STEMI (non-ST elevated myocardial infarction) (HCC) 06/08/2012   Chronic combined systolic and diastolic CHF (congestive heart failure) (HCC) 05/02/2012   AVM (arteriovenous malformation) of small bowel, acquired with hemorrhage 01/20/2012   GERD (gastroesophageal reflux disease) 01/09/2012   Mixed hyperlipidemia 01/05/2012   Atherosclerotic heart disease of native coronary artery without angina pectoris 12/16/2011   Anxiety disorder 05/04/2011   End-stage renal disease on hemodialysis (HCC) 04/29/2011   Gout 04/29/2011   PCP:  Practice, [facility]:   [facility] [address], [place] - [address] [place] [address] Phone: [phone] Fax: [phone]  [facility] (ESRD Bundle Only) - [place], [place] - [name] [place] [address] Phone: [phone] Fax: [phone]  [facility] - [address] [place], [place] - [address] [place] [address] Phone: [phone] Fax: [phone]  [facility] [address] [place] [address] Phone: [phone] Fax: [phone]  Social Determinants of Health (SDOH) Social History: SDOH Screenings   Food Insecurity: No Food Insecurity (05/31/2023)  Housing: Low Risk  (05/31/2023)  Transportation Needs: No Transportation Needs (05/31/2023)  Utilities: Not At Risk (05/31/2023)  Financial Resource Strain: Low Risk  (12/14/2018)  Physical Activity: Unknown (12/14/2018)  Social Connections: Unknown (12/14/2018)  Stress: No Stress Concern Present (12/14/2018)  Tobacco Use: Low Risk   (05/31/2023)  Health Literacy: Adequate Health Literacy (05/04/2023)   Received [facility]  Recent Concern: Health Literacy - Inadequate Health Literacy (02/04/2023)   Received from [facility]   SDOH Interventions:     Readmission Risk Interventions    05/02/2023    2:17 PM 01/29/2023   11:08 AM 10/31/2022   12:45 PM  Readmission Risk Prevention Plan  Transportation Screening Complete Complete Complete  Medication Review Oceanographer) Complete Complete Complete  PCP or Specialist appointment within 3-5 days of discharge  Complete   HRI or Home Care Consult Complete Complete Complete  SW Recovery Care/Counseling Consult Complete Complete Complete  Palliative Care Screening Not Applicable Not Applicable Not Applicable  Skilled Nursing Facility Not Applicable Not Applicable Not Applicable

## 2023-06-01 NOTE — Progress Notes (Signed)
   HEMODIALYSIS TREATMENT NOTE:  3.5 hour heparin-free treatment completed using left internal jugular TDC.  Cath exit site is unremarkable.  3 liter goal was attempted, but then lowered twice in response to precipitous drops in SBP.  Net UF 1.8 liters.  All blood was returned.    06/01/23 1605  Vitals  Temp Source Oral  BP (!) 152/72  MAP (mmHg) 100  BP Location Right Arm  BP Method Automatic  Patient Position (if appropriate) Lying  Pulse Rate (!) 58  Pulse Rate Source Monitor  ECG Heart Rate (!) 58  Resp 14  Oxygen Therapy  SpO2 99 %  O2 Device Room Air  Post Treatment  Dialyzer Clearance Lightly streaked  Hemodialysis Intake (mL) 0 mL  Liters Processed 84  Fluid Removed (mL) 1800 mL  Tolerated HD Treatment Yes  Post-Hemodialysis Comments Adjusted goal was met  Hemodialysis Catheter Left Subclavian Double lumen Temporary (Non-Tunneled)  Placement Date: 06/09/22   Placed prior to admission: Yes  Orientation: Left  Access Location: Subclavian  Hemodialysis Catheter Type: Double lumen Temporary (Non-Tunneled)  Site Condition No complications  Blue Lumen Status Flushed;Heparin locked;Dead end cap in place  Red Lumen Status Flushed;Heparin locked;Dead end cap in place  Purple Lumen Status N/A  Catheter fill solution Heparin 1000 units/ml  Catheter fill volume (Arterial) 1.9 cc  Catheter fill volume (Venous) 1.9  Dressing Type Transparent;Tube stabilization device  Dressing Status Antimicrobial disc in place;Clean, Dry, Intact  Interventions New dressing  Drainage Description None  Dressing Change Due 06/08/23  Post treatment catheter status Capped and Clamped    Arman Filter, RN AP KDU

## 2023-06-01 NOTE — Evaluation (Signed)
Occupational Therapy Evaluation Patient Details Name: Shawna [name] MRN: [id number] DOB: 02-19-1941 Today's Date: 06/01/2023   History of Present Illness Shawna [name]  is a 82 y.o. female,  with medical history significant for recurrent GI bleeds possibly due to AVMs,  paroxysmal atrial fibrillation no longer on anticoagulation due to recurrent GI bleed, end-stage renal disease on hemodialysis Monday/ Wednesday/Fridays, seizure disorder, and coronary artery disease status post CABG and PCI .  -Jent was brought by her husband due to weakness, husband report patient with progressive weakness over the last week, reports she went for dialysis on Monday, but she was too weak today to go for dialysis, she has been having some congestion, cough, and dyspnea, no melena, no vomiting, no coffee-ground emesis, she is not on any anticoagulation, no fever, no chills.  But reports this feels typical for her when she has her anemia which she usually gets with blood transfusion.  -In ED her workup was significant for hemoglobin of 7, but her Hemoccult was negative, she was on room air, labs were significant for low potassium at 2.8, and magnesium of 1.5, given she missed her dialysis today, and need for transfusion [facility] hospitalist consulted to admit. (per MD)   Clinical Impression   Pt agreeable to OT and PT co-evaluation. Pt lethartgic and not oriented to place or situation today. Pt struggled to stay awake throughout the session. Pt required min A for bed mobility. Mod A for transfer to the chair using RW. Max A for lower body dressing as well. Difficult to fully assess pt's status due to lethargy likely being a contributing factor to pt's difficulty today. Pt left in the chair with the chair alarm set. Pt will benefit from continued OT in the hospital and recommended venue below to increase strength, balance, and endurance for safe ADL's.        If plan is discharge home, recommend the following: A lot of help with  walking and/or transfers;A lot of help with bathing/dressing/bathroom;Assistance with cooking/housework;Assist for transportation;Help with stairs or ramp for entrance    Functional Status Assessment  Patient has had a recent decline in their functional status and demonstrates the ability to make significant improvements in function in a reasonable and predictable amount of time.  Equipment Recommendations  None recommended by OT           Precautions / Restrictions Precautions Precautions: Fall Restrictions Weight Bearing Restrictions: No      Mobility Bed Mobility Overal bed mobility: Needs Assistance Bed Mobility: Supine to Sit     Supine to sit: Min assist     General bed mobility comments: labored effort and extedned time    Transfers Overall transfer level: Needs assistance Equipment used: Rolling walker (2 wheels) Transfers: Sit to/from Stand, Bed to chair/wheelchair/BSC Sit to Stand: Mod assist     Step pivot transfers: Mod assist     General transfer comment: unsteady in standing; labored effort and much cuing      Balance Overall balance assessment: Needs assistance Sitting-balance support: Feet supported, No upper extremity supported Sitting balance-Leahy Scale: Poor Sitting balance - Comments: seated at EOB   Standing balance support: Bilateral upper extremity supported, During functional activity, Reliant on assistive device for balance Standing balance-Leahy Scale: Poor Standing balance comment: using RW                           ADL either performed or assessed with clinical judgement  ADL Overall ADL's : Needs assistance/impaired     Grooming: Moderate assistance;Sitting   Upper Body Bathing: Moderate assistance;Sitting   Lower Body Bathing: Maximal assistance;Sitting/lateral leans   Upper Body Dressing : Moderate assistance;Sitting   Lower Body Dressing: Maximal assistance;Sitting/lateral leans   Toilet Transfer:  Moderate assistance;Rolling walker (2 wheels);Stand-pivot Statistician Details (indicate cue type and reason): Simualted via EOB to chair transfer Toileting- Clothing Manipulation and Hygiene: Moderate assistance;Maximal assistance;Sitting/lateral lean       Functional mobility during ADLs: Moderate assistance;Rolling walker (2 wheels);Maximal assistance       Vision Baseline Vision/History: 1 Wears glasses Vision Assessment?: No apparent visual deficits     Perception Perception: Not tested       Praxis Praxis: Not tested       Pertinent Vitals/Pain Pain Assessment Pain Assessment: No/denies pain     Extremity/Trunk Assessment Upper Extremity Assessment Upper Extremity Assessment: Generalized weakness;Difficult to assess due to impaired cognition (Pt reports shoulder arthritis. B UE shoulder flexion limited to ~75% P/ROM. Able to grasp RW.)   Lower Extremity Assessment Lower Extremity Assessment: Defer to PT evaluation   Cervical / Trunk Assessment Cervical / Trunk Assessment: Kyphotic   Communication Communication Communication: No apparent difficulties Cueing Techniques: Tactile cues;Verbal cues   Cognition Arousal: Lethargic Behavior During Therapy: WFL for tasks assessed/performed Overall Cognitive Status: No family/caregiver present to determine baseline cognitive functioning                                 General Comments: Pt had reportedly been given ativan earlier.                      Home Living Family/patient expects to be discharged to:: Private residence Living Arrangements: Spouse/significant other Available Help at Discharge: Family;Available 24 hours/day Type of Home: House Home Access: Stairs to enter [employer] of Steps: 2 Entrance Stairs-Rails: Right Home Layout: One level     Bathroom Shower/Tub: Chief Strategy Officer: Standard Bathroom Accessibility: Yes   Home Equipment: Rollator (4  wheels);Cane - single point;Shower seat;BSC/3in1   Additional Comments: Per chart review. Pt not oriented today and very lethargic.      Prior Functioning/Environment Prior Level of Function : Needs assist       Physical Assist : Mobility (physical);ADLs (physical)     Mobility Comments: Pt typically uses her hurri-cane to ambulate in house but will occasionally use rollator.  Spouse assist with stairs. (per chart review) ADLs Comments: Spouse provides min A for ADLs and total assist for IADLs. Likes to garden (per chart review due to pt's lack or orientation and lethargy today)        OT Problem List: Decreased strength;Decreased range of motion;Decreased activity tolerance;Impaired balance (sitting and/or standing)      OT Treatment/Interventions: Self-care/ADL training;Therapeutic exercise;Patient/family education;Therapeutic activities    OT Goals(Current goals can be found in the care plan section) Acute Rehab OT Goals Patient Stated Goal: none stated OT Goal Formulation: Patient unable to participate in goal setting Time For Goal Achievement: 06/15/23 Potential to Achieve Goals:  (none stated)  OT Frequency: Min 2X/week    Co-evaluation PT/OT/SLP Co-Evaluation/Treatment: Yes Reason for Co-Treatment: To address functional/ADL transfers   OT goals addressed during session: ADL's and self-care      AM-PAC OT "6 Clicks" Daily Activity     Outcome Measure Help from another person eating meals?: A Little Help from  another person taking care of personal grooming?: A Little Help from another person toileting, which includes using toliet, bedpan, or urinal?: A Lot Help from another person bathing (including washing, rinsing, drying)?: A Lot Help from another person to put on and taking off regular upper body clothing?: A Little Help from another person to put on and taking off regular lower body clothing?: A Lot 6 Click Score: 15   End of Session Equipment Utilized During  Treatment: Rolling walker (2 wheels) Nurse Communication: Other (comment) (MD in the room at end of session)  Activity Tolerance: Patient limited by lethargy Patient left: in chair;with call bell/phone within reach;with chair alarm set  OT Visit Diagnosis: Unsteadiness on feet (R26.81);Other abnormalities of gait and mobility (R26.89);Muscle weakness (generalized) (M62.81)                Time: 6962-9528 OT Time Calculation (min): 19 min Charges:  OT General Charges $OT Visit: 1 Visit OT Evaluation $OT Eval Low Complexity: 1 Low  Kadasia Kassing OT, MOT   Danie Chandler 06/01/2023, 9:32 AM

## 2023-06-01 NOTE — Plan of Care (Signed)
  Problem: Acute Rehab OT Goals (only OT should resolve) Goal: Pt. Will Perform Grooming Flowsheets (Taken 06/01/2023 0933) Pt Will Perform Grooming:  with contact guard assist  standing Goal: Pt. Will Perform Upper Body Dressing Flowsheets (Taken 06/01/2023 0933) Pt Will Perform Upper Body Dressing:  with modified independence  sitting Goal: Pt. Will Perform Lower Body Dressing Flowsheets (Taken 06/01/2023 0933) Pt Will Perform Lower Body Dressing:  with min assist  sitting/lateral leans Goal: Pt. Will Transfer To Toilet Flowsheets (Taken 06/01/2023 [phone]) Pt Will Transfer to Toilet:  with supervision  stand pivot transfer Goal: Pt. Will Perform Toileting-Clothing Manipulation Flowsheets (Taken 06/01/2023 0933) Pt Will Perform Toileting - Clothing Manipulation and hygiene:  sitting/lateral leans  with supervision Goal: Pt/Caregiver Will Perform Home Exercise Program Flowsheets (Taken 06/01/2023 [phone]) Pt/caregiver will Perform Home Exercise Program:  Increased strength  Increased ROM  Both right and left upper extremity  Independently  Kaleyah Labreck OT, MOT

## 2023-06-01 NOTE — Plan of Care (Signed)
  Problem: Acute Rehab PT Goals(only PT should resolve) Goal: Pt Will Go Supine/Side To Sit Outcome: Progressing Flowsheets (Taken 06/01/2023 1219) Pt will go Supine/Side to Sit:  with contact guard assist  with minimal assist Goal: Patient Will Transfer Sit To/From Stand Outcome: Progressing Flowsheets (Taken 06/01/2023 1219) Patient will transfer sit to/from stand:  with contact guard assist  with minimal assist Goal: Pt Will Transfer Bed To Chair/Chair To Bed Outcome: Progressing Flowsheets (Taken 06/01/2023 1219) Pt will Transfer Bed to Chair/Chair to Bed:  with contact guard assist  with min assist Goal: Pt Will Ambulate Outcome: Progressing Flowsheets (Taken 06/01/2023 1219) Pt will Ambulate:  25 feet  with contact guard assist  with minimal assist  with rolling walker   12:19 PM, 06/01/23 Ocie Bob, MPT Physical Therapist with [facility] [phone] office [phone] mobile phone

## 2023-06-02 DIAGNOSIS — D62 Acute posthemorrhagic anemia: Secondary | ICD-10-CM | POA: Diagnosis not present

## 2023-06-02 LAB — HEPATITIS B SURFACE ANTIBODY, QUANTITATIVE: Hep B S AB Quant (Post): 153 m[IU]/mL

## 2023-06-02 NOTE — Progress Notes (Signed)
Has been in chair most of day. Walked with assist to bathroom for bm.  Has fed self and eating well.  Husband has called to check on patient.  To have dialysis tomorrow. MD decided did not need picc line since she can received blood tomorrow in dialysis

## 2023-06-02 NOTE — TOC Progression Note (Signed)
Transition of Care [facility]) - Progression Note    Patient Details  Name: Shawna [name] MRN: [id number] Date of Birth: 1940/10/12  Transition of Care [facility]) CM/SW Contact  Elliot Gault, LCSW Phone Number: 06/02/2023, 10:31 AM  Clinical Narrative:     TOC following. Spoke with pt's husband to update on bed offer from [facility]. He accepts this bed. Updated [facility] at [facility].  MD notes indicate plan for pt to have HD tomorrow. [facility] will not be able to accept pt until Monday. If pt medically stable for dc Monday, will facilitate dc at that time. Insurance Berkley Harvey is still pending.  TOC will follow.     Expected Discharge Plan: Skilled Nursing Facility Barriers to Discharge: Continued Medical Work up  Expected Discharge Plan and Services In-house Referral: Clinical Social Work   Post Acute Care Choice: Skilled Nursing Facility Living arrangements for the past 2 months: Single Family Home                                       Social Determinants of Health (SDOH) Interventions SDOH Screenings   Food Insecurity: No Food Insecurity (05/31/2023)  Housing: Low Risk  (05/31/2023)  Transportation Needs: No Transportation Needs (05/31/2023)  Utilities: Not At Risk (05/31/2023)  Financial Resource Strain: Low Risk  (12/14/2018)  Physical Activity: Unknown (12/14/2018)  Social Connections: Unknown (12/14/2018)  Stress: No Stress Concern Present (12/14/2018)  Tobacco Use: Low Risk  (05/31/2023)  Health Literacy: Adequate Health Literacy (05/04/2023)   Received from [facility]  Recent Concern: Health Literacy - Inadequate Health Literacy (02/04/2023)   Received from [facility]    Readmission Risk Interventions    05/02/2023    2:17 PM 01/29/2023   11:08 AM 10/31/2022   12:45 PM  Readmission Risk Prevention Plan  Transportation Screening Complete Complete Complete  Medication Review Oceanographer) Complete Complete Complete  PCP or Specialist appointment within 3-5 days of  discharge  Complete   HRI or Home Care Consult Complete Complete Complete  SW Recovery Care/Counseling Consult Complete Complete Complete  Palliative Care Screening Not Applicable Not Applicable Not Applicable  Skilled Nursing Facility Not Applicable Not Applicable Not Applicable

## 2023-06-02 NOTE — Progress Notes (Signed)
[facility] Progress Note    Assessment/ Plan:   1 weakness/ symptomatic anemia:  has antibodies for prbcs, will take some time to crossmatch. Transfuse prn. Not on a/c. 1u PRBC with HD planned for tomorrow 2 ESRD: HD off schedule, normally MWF. Will plan for HD tomorrow and then back on MWF on 11/25.  No heparin. 3 Hypertension/ volume: CXR with bilateral pleural effusions- will UF as tolerated 4. Anemia of ESRD: s/p aranesp 11/21. Will check Fe panel and request 1u PRBC with HD tomorrow 5. Metabolic Bone Disease: calcitriol with HD, binders as well Dispo: obs    Outpatient Dialysis Orders:  MWF at [facility] 3 hr 30 min EDW 55kg TDC 2K /2.5Ca BFR 300 DFR 500 Calcitrol 0.77mcg PO q HD Mircera 200 mcg IV q 2 weeks-  Discussed with primary service.  Subjective:   Patient seen and examined bedside. S/p HD yesterday (off sched), had issues with hypotension therefore net uf 1.8L.  Patient reports that she does get SOB when talking. No other complaints Patient is a difficult access, plan for labs tomorrow with HD   Objective:   BP (!) 147/53 (BP Location: Right Arm)   Pulse 64   Temp 97.7 F (36.5 C) (Axillary)   Resp 14   Ht 5\' 2"  (1.575 m)   Wt 50.4 kg   SpO2 100%   BMI 20.32 kg/m   Intake/Output Summary (Last 24 hours) at 06/02/2023 4098 Last data filed at 06/02/2023 0500 Gross per 24 hour  Intake 240 ml  Output 1800 ml  Net -1560 ml   Weight change: 0.036 kg  Physical Exam: Gen: NAD, laying in bed CVS: RRR Resp: decreased breath sounds bibasilar, on RA Abd: ND Ext: trace to 1+ pitting edema b/l Les Neuro: awake, alert, conversant Dialysis access: Left TDC c/d/i  Imaging: DG Chest Portable 1 View  Result Date: 05/31/2023 CLINICAL DATA:  Weakness.  End-stage renal disease on dialysis. EXAM: PORTABLE CHEST 1 VIEW COMPARISON:  Chest radiograph dated May 13, 2023. FINDINGS: Stable cardiomegaly. Stable left-sided central venous dialysis  catheter. Prior median sternotomy and CABG. Similar small bilateral layering pleural effusions with basilar atelectasis. Diffuse interstitial prominence. No pneumothorax. No acute osseous abnormality. IMPRESSION: Cardiomegaly with similar diffuse interstitial edema and small bilateral pleural effusions. Electronically Signed   By: Hart Robinsons M.D.   On: 05/31/2023 17:46    Labs: BMET Recent Labs  Lab 05/31/23 1256 06/01/23 1220  NA 131* 131*  132*  K 2.8* 3.6  3.6  CL 97* 96*  96*  CO2 25 27  27   GLUCOSE 175* 105*  104*  BUN 13 17  17   CREATININE 4.41* 5.12*  5.15*  CALCIUM 8.1* 8.3*  8.5*  PHOS  --  4.3   CBC Recent Labs  Lab 05/31/23 1256 06/01/23 1220  WBC 4.1 4.3  HGB 7.0* 7.0*  HCT 20.7* 20.7*  MCV 104.5* 104.5*  PLT 236 246    Medications:     sodium chloride   Intravenous Once   sodium chloride   Intravenous Once   amiodarone  100 mg Oral Daily   Chlorhexidine Gluconate Cloth  6 each Topical Q0600   Chlorhexidine Gluconate Cloth  6 each Topical Q0600   folic acid  1 mg Oral Daily   gabapentin  100 mg Oral QPM   hydrALAZINE  25 mg Oral BID   irbesartan  37.5 mg Oral Daily   levETIRAcetam  500 mg Oral Daily   levothyroxine  50 mcg Oral Q0600   pantoprazole  40 mg Oral Daily   rosuvastatin  10 mg Oral Daily      Anthony Sar, MD [facility] 06/02/2023, 9:03 AM

## 2023-06-02 NOTE — Plan of Care (Signed)
Problem: Education: Goal: Knowledge of General Education information will improve Description: Including pain rating scale, medication(s)/side effects and non-pharmacologic comfort measures Outcome: Progressing   Problem: Health Behavior/Discharge Planning: Goal: Ability to manage health-related needs will improve Outcome: Progressing   Problem: Clinical Measurements: Goal: Ability to maintain clinical measurements within normal limits will improve Outcome: Progressing Goal: Will remain free from infection Outcome: Progressing Goal: Diagnostic test results will improve Outcome: Progressing Goal: Respiratory complications will improve Outcome: Progressing Goal: Cardiovascular complication will be avoided Outcome: Progressing   Problem: Activity: Goal: Risk for activity intolerance will decrease Outcome: Progressing   Problem: Nutrition: Goal: Adequate nutrition will be maintained Outcome: Progressing   Problem: Coping: Goal: Level of anxiety will decrease Outcome: Progressing

## 2023-06-02 NOTE — Progress Notes (Signed)
Progress Note     Patient: Shawna [name]:[id number] DOB: 1941-05-23 DOA: 05/31/2023     0 DOS: the patient was seen and examined on 06/01/2023   Brief hospital course: The patient is a 82 yr old woman who presented to [facility] on 05/31/2023 with complaints of progressive weakness over the last week. She was unable to complete dialysis on Monday due to weakness. She complained of some congestion, cough, and dyspnea. She denied any sign of GI bleed. In the ED she was found to have a hemoglobin of 7, but with a negative hemoccult.  Her potassium was 2.8 and was supplemented. Magnesisum was 1.5 and was supplemented. She missed dialysis on Wednesday. Nephrology was consulted. She has received 1 unit of PRBC's in transfusion. Her hemoglobin following transfusion was again 7.0. Another unit of blood was ordered, but the patient had lost IV access, and new access could not be obtained. Likewise this morning labs could not be drawn. I have discussed the patient with Dr. Thedore Mins. He will draw a CBC and a anemia panel with the patient's labs for dialysis in the morning. If necessary he will also transfuse a unit of PRBC's.   On 06/02/2023 the patient is complaining of finding herself short of breath when she is talking. This could be due to her anemia.  The patient will be going to [facility], but they will not be able to accept her until Monday due to plans to dialyze her tomorrow.   Assessment and Plan:   Active Problems:   Acute on chronic blood loss anemia   Acquired hypothyroidism   Chronic combined systolic and diastolic CHF (congestive heart failure) (HCC)   CAD (coronary artery disease)   Anemia of chronic disease   Symptomatic anemia.   Weakness-deconditioning due to Symptomatic anemia Anemia of chronic kidney disease -Presented with generalized weakness, deconditioning, she missed her HD today because of that -Significant for anemia hemoglobin of 7, her Hemoccult is negative -Patient was transfused  with 1 unit PRBC. After the transfusion, her hemoglobin is again 7.0. Will recheck an iron panel and a CBC with dialysis in the morning. She will then receive a unit of blood with HD if necessary.  Most recent Fe 136 in June.    No signs of intravascular hemolysis.  -Place potassium and magnesium -Will consult PT   ESRD -Renal Consulted. Will dialyze tomorrow.   Atrial tachycardia/paroxysmal atrial fibrillation/flutter/?WCT: -Has been seen by cardiology in the past, continue rhythm control medication with amiodarone and metoprolol -Not on anticoagulation secondary to recurrent GI bleed   Chronic systolic CHF with EF 30-35% CAD status post CABG with residual small vessel disease -Has any chest pain or dyspnea, but reports generalized weakness, will receive a unit PRBC -Volume management with dialysis -Aspirin for anticoagulation secondary  -Not on aspirin or anticoagulation given recurrent GI bleed requiring multiple transfusions    History of AVM -Denies any GI bleeds, she is Hemoccult negative in ED   hypertension -Continue with home medications, will add as needed hydralazine   Hyperlipidemia: Continue with statin   Type diabetes mellitus  -This is diet controlled   Acquired hypothyroidism  -With Synthroid   DVT Prophylaxis SCDs    AM Labs Ordered, also please review Full Orders   Subjective: The patient is awake and alert. She states that she still feels weak and feels short of breath when she talks.   Physical Exam:       Vitals:    06/01/23 1430 06/01/23  1445 06/01/23 1500 06/01/23 1530  BP: (!) 140/67 125/61 120/66 (!) 137/59  Pulse:          Resp: 19 11 20 17   Temp:          TempSrc:          SpO2:          Weight:          Height:            Exam:   Constitutional:  The patient is awake, alert, and oriented x 3. No acute distress. Respiratory:  No increased work of breathing. No wheezes, rales, or rhonchi No tactile fremitus Cardiovascular:   Regular rate and rhythm No murmurs, ectopy, or gallups. No lateral PMI. No thrills. Abdomen:  Abdomen is soft, non-tender, non-distended No hernias, masses, or organomegaly Normoactive bowel sounds.  Musculoskeletal:  No cyanosis, clubbing, or edema Skin:  No rashes, lesions, ulcers palpation of skin: no induration or nodules Neurologic:  CN 2-12 intact Sensation all 4 extremities intact Psychiatric:  Mental status Mood, affect appropriate Orientation to person, place, time  judgment and insight appear intact   Family Communication: None available.   Disposition: Status is: Observation The patient remains OBS appropriate and will d/c before 2 midnights.  Planned Discharge Destination: Home       Time spent: 32 minutes   Author: Tyreek Clabo, DO 06/02/2023 4:29 PM   For on call review [email or web address].

## 2023-06-02 NOTE — Progress Notes (Signed)
Physical Therapy Treatment Patient Details Name: Shawna [name] MRN: [id number] DOB: 02-25-1941 Today's Date: 06/02/2023   History of Present Illness Shawna [name]  is a 82 y.o. female,  with medical history significant for recurrent GI bleeds possibly due to AVMs,  paroxysmal atrial fibrillation no longer on anticoagulation due to recurrent GI bleed, end-stage renal disease on hemodialysis Monday/ Wednesday/Fridays, seizure disorder, and coronary artery disease status post CABG and PCI .  -Shawna [name] was brought by her husband due to weakness, husband report patient with progressive weakness over the last week, reports she went for dialysis on Monday, but she was too weak today to go for dialysis, she has been having some congestion, cough, and dyspnea, no melena, no vomiting, no coffee-ground emesis, she is not on any anticoagulation, no fever, no chills.  But reports this feels typical for her when she has her anemia which she usually gets with blood transfusion.  -In ED her workup was significant for hemoglobin of 7, but her Hemoccult was negative, she was on room air, labs were significant for low potassium at 2.8, and magnesium of 1.5, given she missed her dialysis today, and need for transfusion [facility] hospitalist consulted to admit.    PT Comments  Patient sitting up in a chair on therapist arrival.  She is pleasant, alert and agreeable to therapy treatment.  PT leads patient in seated lower extremity therapeutic exercises to include ankle pumps, long arc quads and marching x 12 reps each.  Patient then performs sit to stand with RW and CGA/min A.  She needs cues to improve her standing posture as she tends to stand with her trunk flexed. Patient also needs some assistance with navigating the walker to begin ambulation.  Patient ambulates with RW and Cga to min A x 40 ft with slow cadence, flexed trunk; needs frequent cuing to look up and stand tall; she needs 2 standing rest breaks as she fatigues quickly.   She also needs assist with steering the walker.  Patient left in chair with chair alarm set,  call button in reach and feet elevated. Patient will benefit from continued skilled therapy services during the remainder of her hospital stay and at the next recommended venue of care to address deficits and promote return to optimal function.       If plan is discharge home, recommend the following: A lot of help with bathing/dressing/bathroom;A lot of help with walking and/or transfers;Help with stairs or ramp for entrance;Assistance with cooking/housework   Can travel by private vehicle     Yes  Equipment Recommendations  None recommended by PT    Recommendations for Other Services       Precautions / Restrictions Precautions Precautions: Fall Restrictions Weight Bearing Restrictions: No     Mobility  Bed Mobility               General bed mobility comments: sitting up in chair assisted by nursing Patient Response: Cooperative  Transfers Overall transfer level: Needs assistance Equipment used: Rolling walker (2 wheels) Transfers: Sit to/from Stand Sit to Stand: Contact guard assist, Min assist           General transfer comment: sit to stand with CGA to RW; needs cues to improve her posture    Ambulation/Gait Ambulation/Gait assistance: Contact guard assist, Min assist Gait Distance (Feet): 40 Feet Assistive device: Rolling walker (2 wheels) Gait Pattern/deviations: Decreased step length - right, Decreased step length - left, Decreased stride length, Trunk flexed Gait velocity: decreased  General Gait Details: fatigues quickly; tends to lean on the walker; had to take a couple of standing rest breaks and needed some Assist with navigating walker   Stairs             Wheelchair Mobility     Tilt Bed Tilt Bed Patient Response: Cooperative  Modified Rankin (Stroke Patients Only)       Balance Overall balance assessment: Needs  assistance Sitting-balance support: Feet supported, No upper extremity supported Sitting balance-Leahy Scale: Fair Sitting balance - Comments: fair to good balance seated on edge of chair   Standing balance support: Bilateral upper extremity supported, During functional activity, Reliant on assistive device for balance Standing balance-Leahy Scale: Fair Standing balance comment: fair to good standing balance with RW; needs cues to improve posturing                            Cognition Arousal: Alert Behavior During Therapy: WFL for tasks assessed/performed Overall Cognitive Status: Within Functional Limits for tasks assessed                                 General Comments: pleasant and cooperative with therapy; occasionally demonstrates some mild confusion        Exercises Other Exercises Other Exercises: seated ankle pumps, LAQs and marching x 12 reps each    General Comments        Pertinent Vitals/Pain Pain Assessment Pain Assessment: 0-10 Pain Score: 3  Pain Location: left arm Pain Intervention(s): Limited activity within patient's tolerance, Monitored during session    Home Living Family/patient expects to be discharged to:: Private residence Living Arrangements: Spouse/significant other Available Help at Discharge: Family;Available 24 hours/day Type of Home: House Home Access: Stairs to enter Entrance Stairs-Rails: Right Entrance Stairs-Number of Steps: 2   Home Layout: One level Home Equipment: Rollator (4 wheels);Cane - single point;Shower seat;BSC/3in1 Additional Comments: Per chart review. Pt not oriented today and very lethargic.    Prior Function            PT Goals (current goals can now be found in the care plan section) Acute Rehab PT Goals Patient Stated Goal: return home PT Goal Formulation: With patient Time For Goal Achievement: 06/15/23 Potential to Achieve Goals: Good    Frequency    Min 3X/week      PT  Plan      Co-evaluation     PT goals addressed during session: Mobility/safety with mobility;Balance;Proper use of DME        AM-PAC PT "6 Clicks" Mobility   Outcome Measure  Help needed turning from your back to your side while in a flat bed without using bedrails?: A Lot Help needed moving from lying on your back to sitting on the side of a flat bed without using bedrails?: A Lot Help needed moving to and from a bed to a chair (including a wheelchair)?: A Lot Help needed standing up from a chair using your arms (e.g., wheelchair or bedside chair)?: A Lot Help needed to walk in hospital room?: A Lot Help needed climbing 3-5 steps with a railing? : Total 6 Click Score: 11    End of Session Equipment Utilized During Treatment: Gait belt Activity Tolerance: Patient tolerated treatment well;Patient limited by fatigue Patient left: in chair;with call bell/phone within reach;with chair alarm set Nurse Communication: Mobility status PT Visit Diagnosis: Unsteadiness on feet (  R26.81);Other abnormalities of gait and mobility (R26.89);Muscle weakness (generalized) (M62.81);Pain Pain - Right/Left: Left Pain - part of body: Arm     Time: 1348-1411 PT Time Calculation (min) (ACUTE ONLY): 23 min  Charges:    $Therapeutic Exercise: 8-22 mins $Therapeutic Activity: 8-22 mins PT General Charges $$ ACUTE PT VISIT: 1 Visit                     2:35 PM, 06/02/23 Zaiya Annunziato Small Amani Nodarse MPT [facility] [place] [phone] [phone]

## 2023-06-03 DIAGNOSIS — D62 Acute posthemorrhagic anemia: Secondary | ICD-10-CM | POA: Diagnosis not present

## 2023-06-03 DIAGNOSIS — D649 Anemia, unspecified: Secondary | ICD-10-CM | POA: Diagnosis not present

## 2023-06-03 DIAGNOSIS — I25118 Atherosclerotic heart disease of native coronary artery with other forms of angina pectoris: Secondary | ICD-10-CM | POA: Diagnosis not present

## 2023-06-03 DIAGNOSIS — I5042 Chronic combined systolic (congestive) and diastolic (congestive) heart failure: Secondary | ICD-10-CM | POA: Diagnosis not present

## 2023-06-03 LAB — RENAL FUNCTION PANEL
Albumin: 1.7 g/dL — ABNORMAL LOW (ref 3.5–5.0)
Anion gap: 10 (ref 5–15)
BUN: 17 mg/dL (ref 8–23)
CO2: 27 mmol/L (ref 22–32)
Calcium: 8.3 mg/dL — ABNORMAL LOW (ref 8.9–10.3)
Chloride: 97 mmol/L — ABNORMAL LOW (ref 98–111)
Creatinine, Ser: 4.7 mg/dL — ABNORMAL HIGH (ref 0.44–1.00)
GFR, Estimated: 9 mL/min — ABNORMAL LOW (ref >60)
Glucose, Bld: 83 mg/dL (ref 70–99)
Phosphorus: 3.3 mg/dL (ref 2.5–4.6)
Potassium: 3.8 mmol/L (ref 3.5–5.1)
Sodium: 134 mmol/L — ABNORMAL LOW (ref 135–145)

## 2023-06-03 LAB — PREPARE RBC (CROSSMATCH)

## 2023-06-03 LAB — CBC
HCT: 19.3 % — ABNORMAL LOW (ref 36.0–46.0)
Hemoglobin: 6.4 g/dL — CL (ref 12.0–15.0)
MCH: 35.2 pg — ABNORMAL HIGH (ref 26.0–34.0)
MCHC: 33.2 g/dL (ref 30.0–36.0)
MCV: 106 fL — ABNORMAL HIGH (ref 80.0–100.0)
Platelets: 226 10*3/uL (ref 150–400)
RBC: 1.82 MIL/uL — ABNORMAL LOW (ref 3.87–5.11)
WBC: 3.6 10*3/uL — ABNORMAL LOW (ref 4.0–10.5)
nRBC: 0 % (ref 0.0–0.2)

## 2023-06-03 LAB — FERRITIN: Ferritin: 691 ng/mL — ABNORMAL HIGH (ref 11–307)

## 2023-06-03 LAB — IRON AND TIBC: Iron: 77 ug/dL (ref 28–170)

## 2023-06-03 MED ORDER — SODIUM CHLORIDE 0.9% IV SOLUTION: Freq: Once | INTRAVENOUS | Status: DC

## 2023-06-03 NOTE — Progress Notes (Signed)
Patient alert with confusion, tolerated medications whole with applesauce with no complaints. Patient received one unit of RBC in dialysis today.

## 2023-06-03 NOTE — Progress Notes (Signed)
Progress Note     Patient: Shawna [name]:[id number] DOB: 1940-09-27 DOA: 05/31/2023     0 DOS: the patient was seen and examined on 06/01/2023   Brief hospital course: The patient is a 82 yr old woman who presented to [facility] on 05/31/2023 with complaints of progressive weakness over the last week. She was unable to complete dialysis on Monday due to weakness. She complained of some congestion, cough, and dyspnea. She denied any sign of GI bleed. In the ED she was found to have a hemoglobin of 7, but with a negative hemoccult.  Her potassium was 2.8 and was supplemented. Magnesisum was 1.5 and was supplemented. She missed dialysis on Wednesday. Nephrology was consulted. She has received 1 unit of PRBC's in transfusion. Her hemoglobin following transfusion was again 7.0. Another unit of blood was ordered, but the patient had lost IV access, and new access could not be obtained. Likewise this morning labs could not be drawn. I have discussed the patient with Dr. Thedore Mins. He will draw a CBC and a anemia panel with the patient's labs for dialysis in the morning. If necessary he will also transfuse a unit of PRBC's.   On 06/02/2023 the patient is complaining of finding herself short of breath when she is talking. This could be due to her anemia.  The patient was seen in dialysis. She is tolerating the procedure well.    Assessment and Plan:   Active Problems:   Acute on chronic blood loss anemia   Acquired hypothyroidism   Chronic combined systolic and diastolic CHF (congestive heart failure) (HCC)   CAD (coronary artery disease)   Anemia of chronic disease   Symptomatic anemia.   Weakness-deconditioning due to Symptomatic anemia Anemia of chronic kidney disease -Presented with generalized weakness, deconditioning, she missed her HD today because of that -Significant for anemia hemoglobin of 7, her Hemoccult is negative -Patient was transfused with 2nd unit of PRBC's during HD today for HGb of  6.4 on CBC.    No signs of intravascular hemolysis.  -Potassium and magnesium replaced. -Will consult PT   ESRD -Renal Consulted. Will dialyze tomorrow.   Atrial tachycardia/paroxysmal atrial fibrillation/flutter/?WCT: -Has been seen by cardiology in the past, continue rhythm control medication with amiodarone and metoprolol -Not on anticoagulation secondary to recurrent GI bleed   Chronic systolic CHF with EF 30-35% CAD status post CABG with residual small vessel disease -Has any chest pain or dyspnea, but reports generalized weakness, will receive a unit PRBC -Volume management with dialysis -Aspirin for anticoagulation secondary  -Not on aspirin or anticoagulation given recurrent GI bleed requiring multiple transfusions    History of AVM -Denies any GI bleeds, she is Hemoccult negative in ED   hypertension -Continue with home medications, will add as needed hydralazine   Hyperlipidemia: Continue with statin   Type diabetes mellitus  -This is diet controlled   Acquired hypothyroidism  -With Synthroid   DVT Prophylaxis SCDs    AM Labs Ordered, also please review Full Orders   Subjective: The patient is awake and alert. She states that she still feels weak and feels short of breath when she talks.   Physical Exam: Vitals:   06/03/23 1325 06/03/23 1445  BP: (!) 166/61 (!) 148/126  Pulse: 65 72  Resp: 17 16  Temp: (!) 97.5 F (36.4 C) 97.6 F (36.4 C)  SpO2: 99% 99%     Exam:   Constitutional:  The patient is awake, alert, and oriented x 3. No  acute distress. Respiratory:  No increased work of breathing. No wheezes, rales, or rhonchi No tactile fremitus Cardiovascular:  Regular rate and rhythm No murmurs, ectopy, or gallups. No lateral PMI. No thrills. Abdomen:  Abdomen is soft, non-tender, non-distended No hernias, masses, or organomegaly Normoactive bowel sounds.  Musculoskeletal:  No cyanosis, clubbing, or edema Skin:  No rashes, lesions,  ulcers palpation of skin: no induration or nodules Neurologic:  CN 2-12 intact Sensation all 4 extremities intact Psychiatric:  Mental status Mood, affect appropriate Orientation to person, place, time  judgment and insight appear intact   Family Communication: None available.   Disposition: Status is: Observation The patient remains OBS appropriate and will d/c before 2 midnights.  Planned Discharge Destination: Home       Time spent: 34 minutes   Author: Walker Paddack, DO 06/03/2023 3:58 PM   For on call review [email or web address].

## 2023-06-03 NOTE — Progress Notes (Signed)
Pt goal not met d/t BP unstable.PRBC 1 unit given d/t HgB 6.4. Dr. Ronalee Belts notified.  06/03/23 1304  Vitals  Temp 97.7 F (36.5 C)  Temp Source Oral  BP (!) 144/48  BP Location Right Arm  BP Method Automatic  Patient Position (if appropriate) Lying  Oxygen Therapy  SpO2 100 %  O2 Device Room Air  During Treatment Monitoring  Intra-Hemodialysis Comments Tx completed  Post Treatment  Dialyzer Clearance Lightly streaked  Hemodialysis Intake (mL) 0 mL  Liters Processed 74  Fluid Removed (mL) 800 mL  Tolerated HD Treatment Yes  Post-Hemodialysis Comments Pt goal not met d/t low BP.  Hemodialysis Catheter Left Subclavian Double lumen Temporary (Non-Tunneled)  Placement Date: 06/09/22   Placed prior to admission: Yes  Orientation: Left  Access Location: Subclavian  Hemodialysis Catheter Type: Double lumen Temporary (Non-Tunneled)  Site Condition No complications  Blue Lumen Status Heparin locked  Red Lumen Status Heparin locked  Catheter fill solution Heparin 1000 units/ml  Catheter fill volume (Arterial) 1.9 cc  Catheter fill volume (Venous) 1.6  Dressing Type Gauze/Drain sponge;Transparent  Dressing Status Antimicrobial disc in place  Interventions New dressing  Drainage Description None  Dressing Change Due 06/08/23  Post treatment catheter status Capped and Clamped

## 2023-06-03 NOTE — Plan of Care (Signed)
Patient alert and oriented to person and place. All needs attended to. No distress noted, no complaints voiced. Will continue to monitor.   Problem: Education: Goal: Knowledge of General Education information will improve Description: Including pain rating scale, medication(s)/side effects and non-pharmacologic comfort measures Outcome: Progressing   Problem: Health Behavior/Discharge Planning: Goal: Ability to manage health-related needs will improve Outcome: Progressing   Problem: Clinical Measurements: Goal: Ability to maintain clinical measurements within normal limits will improve Outcome: Progressing Goal: Will remain free from infection Outcome: Progressing Goal: Diagnostic test results will improve Outcome: Progressing Goal: Respiratory complications will improve Outcome: Progressing Goal: Cardiovascular complication will be avoided Outcome: Progressing   Problem: Activity: Goal: Risk for activity intolerance will decrease Outcome: Progressing   Problem: Nutrition: Goal: Adequate nutrition will be maintained Outcome: Progressing   Problem: Coping: Goal: Level of anxiety will decrease Outcome: Progressing   Problem: Elimination: Goal: Will not experience complications related to bowel motility Outcome: Progressing Goal: Will not experience complications related to urinary retention Outcome: Progressing   Problem: Pain Management: Goal: General experience of comfort will improve Outcome: Progressing   Problem: Safety: Goal: Ability to remain free from injury will improve Outcome: Progressing   Problem: Skin Integrity: Goal: Risk for impaired skin integrity will decrease Outcome: Progressing

## 2023-06-03 NOTE — Progress Notes (Addendum)
Lab called critical lab hemoglobin 6.4. MD Swayze made aware. Per MD inform nephrology. Carlyon Prows, RN spoke with nephrology and they ordered one unit of RBC. Transfused 1 unit of RBC in dialysis today.

## 2023-06-04 DIAGNOSIS — R627 Adult failure to thrive: Secondary | ICD-10-CM | POA: Diagnosis present

## 2023-06-04 DIAGNOSIS — I4719 Other supraventricular tachycardia: Secondary | ICD-10-CM | POA: Diagnosis present

## 2023-06-04 DIAGNOSIS — D500 Iron deficiency anemia secondary to blood loss (chronic): Secondary | ICD-10-CM | POA: Diagnosis present

## 2023-06-04 DIAGNOSIS — N186 End stage renal disease: Secondary | ICD-10-CM | POA: Diagnosis present

## 2023-06-04 DIAGNOSIS — Z1152 Encounter for screening for COVID-19: Secondary | ICD-10-CM | POA: Diagnosis not present

## 2023-06-04 DIAGNOSIS — E039 Hypothyroidism, unspecified: Secondary | ICD-10-CM | POA: Diagnosis present

## 2023-06-04 DIAGNOSIS — I5042 Chronic combined systolic (congestive) and diastolic (congestive) heart failure: Secondary | ICD-10-CM | POA: Diagnosis present

## 2023-06-04 DIAGNOSIS — E1122 Type 2 diabetes mellitus with diabetic chronic kidney disease: Secondary | ICD-10-CM | POA: Diagnosis present

## 2023-06-04 DIAGNOSIS — I480 Paroxysmal atrial fibrillation: Secondary | ICD-10-CM | POA: Diagnosis present

## 2023-06-04 DIAGNOSIS — D649 Anemia, unspecified: Secondary | ICD-10-CM | POA: Diagnosis present

## 2023-06-04 DIAGNOSIS — I2510 Atherosclerotic heart disease of native coronary artery without angina pectoris: Secondary | ICD-10-CM | POA: Diagnosis present

## 2023-06-04 DIAGNOSIS — Z992 Dependence on renal dialysis: Secondary | ICD-10-CM | POA: Diagnosis not present

## 2023-06-04 DIAGNOSIS — I4892 Unspecified atrial flutter: Secondary | ICD-10-CM | POA: Diagnosis present

## 2023-06-04 DIAGNOSIS — I132 Hypertensive heart and chronic kidney disease with heart failure and with stage 5 chronic kidney disease, or end stage renal disease: Secondary | ICD-10-CM | POA: Diagnosis present

## 2023-06-04 DIAGNOSIS — E7800 Pure hypercholesterolemia, unspecified: Secondary | ICD-10-CM | POA: Diagnosis present

## 2023-06-04 DIAGNOSIS — Z8249 Family history of ischemic heart disease and other diseases of the circulatory system: Secondary | ICD-10-CM | POA: Diagnosis not present

## 2023-06-04 DIAGNOSIS — I081 Rheumatic disorders of both mitral and tricuspid valves: Secondary | ICD-10-CM | POA: Diagnosis present

## 2023-06-04 DIAGNOSIS — D631 Anemia in chronic kidney disease: Secondary | ICD-10-CM | POA: Diagnosis present

## 2023-06-04 DIAGNOSIS — G40909 Epilepsy, unspecified, not intractable, without status epilepticus: Secondary | ICD-10-CM | POA: Diagnosis present

## 2023-06-04 LAB — TYPE AND SCREEN
ABO/RH(D): O POS
Antibody Screen: POSITIVE
DAT, IgG: NEGATIVE
Donor AG Type: NEGATIVE
Donor AG Type: NEGATIVE
Unit division: 0
Unit division: 0

## 2023-06-04 LAB — CBC WITH DIFFERENTIAL/PLATELET
Abs Immature Granulocytes: 0.02 10*3/uL (ref 0.00–0.07)
Basophils Absolute: 0.1 10*3/uL (ref 0.0–0.1)
Basophils Relative: 2 %
Eosinophils Absolute: 0.3 10*3/uL (ref 0.0–0.5)
Eosinophils Relative: 6 %
HCT: 27.1 % — ABNORMAL LOW (ref 36.0–46.0)
Hemoglobin: 9.2 g/dL — ABNORMAL LOW (ref 12.0–15.0)
Immature Granulocytes: 0 %
Lymphocytes Relative: 16 %
Lymphs Abs: 0.7 10*3/uL (ref 0.7–4.0)
MCH: 33.8 pg (ref 26.0–34.0)
MCHC: 33.9 g/dL (ref 30.0–36.0)
MCV: 99.6 fL (ref 80.0–100.0)
Monocytes Absolute: 0.5 10*3/uL (ref 0.1–1.0)
Monocytes Relative: 11 %
Neutro Abs: 2.9 10*3/uL (ref 1.7–7.7)
Neutrophils Relative %: 65 %
Platelets: 210 10*3/uL (ref 150–400)
RBC: 2.72 MIL/uL — ABNORMAL LOW (ref 3.87–5.11)
RDW: 26.6 % — ABNORMAL HIGH (ref 11.5–15.5)
WBC: 4.5 10*3/uL (ref 4.0–10.5)
nRBC: 0 % (ref 0.0–0.2)

## 2023-06-04 LAB — BASIC METABOLIC PANEL
Anion gap: 5 (ref 5–15)
BUN: 11 mg/dL (ref 8–23)
CO2: 29 mmol/L (ref 22–32)
Calcium: 8.1 mg/dL — ABNORMAL LOW (ref 8.9–10.3)
Chloride: 99 mmol/L (ref 98–111)
Creatinine, Ser: 3.07 mg/dL — ABNORMAL HIGH (ref 0.44–1.00)
GFR, Estimated: 15 mL/min — ABNORMAL LOW (ref >60)
Glucose, Bld: 81 mg/dL (ref 70–99)
Potassium: 4 mmol/L (ref 3.5–5.1)
Sodium: 133 mmol/L — ABNORMAL LOW (ref 135–145)

## 2023-06-04 LAB — BPAM RBC
Blood Product Expiration Date: 202412152359
Blood Product Unit Number: 202412152359
ISSUE DATE / TIME: 202411210103
ISSUE DATE / TIME: 202411231223
Unit Type and Rh: 5100
Unit Type and Rh: 6200

## 2023-06-04 MED ORDER — CHLORHEXIDINE GLUCONATE CLOTH 2 % EX PADS
6.0000 | MEDICATED_PAD | Freq: Every day | CUTANEOUS | Status: DC
  Administered 2023-06-04 – 2023-06-05 (×2): 6 via TOPICAL

## 2023-06-04 NOTE — Progress Notes (Signed)
Progress Note     Patient: Shawna [name] WUJ:[id number] DOB: [birth year] DOA: 05/31/2023     0 DOS: the patient was seen and examined on 06/01/2023   Brief hospital course: The patient is a 82 yr old woman who presented to [facility] on 05/31/2023 with complaints of progressive weakness over the last week. She was unable to complete dialysis on Monday due to weakness. She complained of some congestion, cough, and dyspnea. She denied any sign of GI bleed. In the ED she was found to have a hemoglobin of 7, but with a negative hemoccult.  Her potassium was 2.8 and was supplemented. Magnesisum was 1.5 and was supplemented. She missed dialysis on Wednesday. Nephrology was consulted. She has received 1 unit of PRBC's in transfusion. Her hemoglobin following transfusion was again 7.0. Another unit of blood was ordered, but the patient had lost IV access, and new access could not be obtained. Likewise this morning labs could not be drawn. I have discussed the patient with Dr. Thedore Mins. He will draw a CBC and a anemia panel with the patient's labs for dialysis in the morning. If necessary he will also transfuse a unit of PRBC's.   On 06/02/2023 the patient is complaining of finding herself short of breath when she is talking. This could be due to her anemia.  The patient states that she is feeling better. No new complaints.   Plan is for discharge to SNF. The patient is medically cleared for discharge.   Assessment and Plan:   Active Problems:   Acute on chronic blood loss anemia   Acquired hypothyroidism   Chronic combined systolic and diastolic CHF (congestive heart failure) (HCC)   CAD (coronary artery disease)   Anemia of chronic disease   Symptomatic anemia.   Weakness-deconditioning due to Symptomatic anemia Anemia of chronic kidney disease -Presented with generalized weakness, deconditioning, she missed her HD today because of that -Significant for anemia hemoglobin of 7, her Hemoccult is  negative -Patient was transfused with 2nd unit of PRBC's during HD today for HGb of 6.4 on CBC.    No signs of intravascular hemolysis.  -Potassium and magnesium replaced. -Will consult PT   ESRD -Renal Consulted. Will dialyze again next tuesday.   Atrial tachycardia/paroxysmal atrial fibrillation/flutter/?WCT: -Has been seen by cardiology in the past, continue rhythm control medication with amiodarone and metoprolol -Not on anticoagulation secondary to recurrent GI bleed   Chronic systolic CHF with EF 30-35% CAD status post CABG with residual small vessel disease -Has any chest pain or dyspnea, but reports generalized weakness, will receive a unit PRBC -Volume management with dialysis -Aspirin for anticoagulation secondary  -Not on aspirin or anticoagulation given recurrent GI bleed requiring multiple transfusions    History of AVM -Denies any GI bleeds, she is Hemoccult negative in ED   hypertension -Continue with home medications, will add as needed hydralazine   Hyperlipidemia: Continue with statin   Type diabetes mellitus  -This is diet controlled   Acquired hypothyroidism  -With Synthroid   DVT Prophylaxis SCDs    AM Labs Ordered, also please review Full Orders   Subjective: The patient is awake and alert. She states that she still feels weak and feels short of breath when she talks.   Physical Exam: Vitals:   06/04/23 0444 06/04/23 1203  BP: (!) 173/64 (!) 143/54  Pulse: 75 73  Resp:    Temp: 98.8 F (37.1 C) 98.6 F (37 C)  SpO2: 100% 98%     Exam:  Constitutional:  The patient is awake, alert, and oriented x 3. No acute distress. Respiratory:  No increased work of breathing. No wheezes, rales, or rhonchi No tactile fremitus Cardiovascular:  Regular rate and rhythm No murmurs, ectopy, or gallups. No lateral PMI. No thrills. Abdomen:  Abdomen is soft, non-tender, non-distended No hernias, masses, or organomegaly Normoactive bowel sounds.   Musculoskeletal:  No cyanosis, clubbing, or edema Skin:  No rashes, lesions, ulcers palpation of skin: no induration or nodules Neurologic:  CN 2-12 intact Sensation all 4 extremities intact Psychiatric:  Mental status Mood, affect appropriate Orientation to person, place, time  judgment and insight appear intact   Family Communication: None available.   Disposition: Status is: Observation The patient remains OBS appropriate and will d/c before 2 midnights.  Planned Discharge Destination: Home       Time spent: 32 minutes   Author: Jillene Wehrenberg, DO 06/04/2023 4:12 PM   For on call review [email or web address].

## 2023-06-04 NOTE — TOC Progression Note (Signed)
Transition of Care [facility]) - Progression Note    Patient Details  Name: Shawna [name] MRN: [id number] Date of Birth: 12-06-1940  Transition of Care [facility]) CM/SW Contact  Catalina Gravel, [place] Phone Number: 06/04/2023, 9:05 AM  Clinical Narrative:     CSW checked [name] approved #[id number] 11/22-11/26.  Pt DC to [facility]  Monday following HD.  TOC to follow.   Expected Discharge Plan: Skilled Nursing Facility Barriers to Discharge: Continued Medical Work up  Expected Discharge Plan and Services In-house Referral: Clinical Social Work   Post Acute Care Choice: Skilled Nursing Facility Living arrangements for the past 2 months: Single Family Home                                       Social Determinants of Health (SDOH) Interventions SDOH Screenings   Food Insecurity: No Food Insecurity (05/31/2023)  Housing: Low Risk  (05/31/2023)  Transportation Needs: No Transportation Needs (05/31/2023)  Utilities: Not At Risk (05/31/2023)  Financial Resource Strain: Low Risk  (12/14/2018)  Physical Activity: Unknown (12/14/2018)  Social Connections: Unknown (12/14/2018)  Stress: No Stress Concern Present (12/14/2018)  Tobacco Use: Low Risk  (05/31/2023)  Health Literacy: Adequate Health Literacy (05/04/2023)   [facility]  Recent Concern: Health Literacy - Inadequate Health Literacy (02/04/2023)   Received from [facility]    Readmission Risk Interventions    05/02/2023    2:17 PM 01/29/2023   11:08 AM 10/31/2022   12:45 PM  Readmission Risk Prevention Plan  Transportation Screening Complete Complete Complete  Medication Review Oceanographer) Complete Complete Complete  PCP or Specialist appointment within 3-5 days of discharge  Complete   HRI or Home Care Consult Complete Complete Complete  SW Recovery Care/Counseling Consult Complete Complete Complete  Palliative Care Screening Not Applicable Not Applicable Not Applicable  Skilled Nursing  Facility Not Applicable Not Applicable Not Applicable

## 2023-06-05 DIAGNOSIS — R627 Adult failure to thrive: Secondary | ICD-10-CM | POA: Diagnosis not present

## 2023-06-05 LAB — CBC
HCT: 26.1 % — ABNORMAL LOW (ref 36.0–46.0)
Hemoglobin: 8.7 g/dL — ABNORMAL LOW (ref 12.0–15.0)
MCH: 34.4 pg — ABNORMAL HIGH (ref 26.0–34.0)
MCHC: 33.3 g/dL (ref 30.0–36.0)
MCV: 103.2 fL — ABNORMAL HIGH (ref 80.0–100.0)
Platelets: 203 10*3/uL (ref 150–400)
RBC: 2.53 MIL/uL — ABNORMAL LOW (ref 3.87–5.11)
RDW: 26.7 % — ABNORMAL HIGH (ref 11.5–15.5)
WBC: 3.8 10*3/uL — ABNORMAL LOW (ref 4.0–10.5)
nRBC: 0 % (ref 0.0–0.2)

## 2023-06-05 LAB — RENAL FUNCTION PANEL
Albumin: 1.7 g/dL — ABNORMAL LOW (ref 3.5–5.0)
Anion gap: 5 (ref 5–15)
BUN: 22 mg/dL (ref 8–23)
CO2: 28 mmol/L (ref 22–32)
Calcium: 8 mg/dL — ABNORMAL LOW (ref 8.9–10.3)
Chloride: 100 mmol/L (ref 98–111)
Creatinine, Ser: 4.74 mg/dL — ABNORMAL HIGH (ref 0.44–1.00)
GFR, Estimated: 9 mL/min — ABNORMAL LOW (ref >60)
Glucose, Bld: 69 mg/dL — ABNORMAL LOW (ref 70–99)
Phosphorus: 3.3 mg/dL (ref 2.5–4.6)
Potassium: 4 mmol/L (ref 3.5–5.1)
Sodium: 133 mmol/L — ABNORMAL LOW (ref 135–145)

## 2023-06-05 MED ORDER — ALPRAZOLAM 0.5 MG PO TABS: 0.5000 mg | ORAL_TABLET | Freq: Every day | ORAL | 0 refills | Status: AC | PRN

## 2023-06-05 MED ORDER — GABAPENTIN 100 MG PO CAPS: 100.0000 mg | ORAL_CAPSULE | Freq: Every evening | ORAL | 0 refills | Status: DC

## 2023-06-05 MED ORDER — HEPARIN SODIUM (PORCINE) 1000 UNIT/ML IJ SOLN
INTRAMUSCULAR | Status: AC
  Filled 2023-06-05: qty 5

## 2023-06-05 NOTE — Plan of Care (Signed)
  Problem: Pain Management: Goal: General experience of comfort will improve Outcome: Progressing   Problem: Coping: Goal: Level of anxiety will decrease Outcome: Progressing   Problem: Clinical Measurements: Goal: Ability to maintain clinical measurements within normal limits will improve Outcome: Progressing

## 2023-06-05 NOTE — Progress Notes (Signed)
Patient ID: Shawna [name], female   DOB: Dec 31, 1940, 82 y.o.   MRN: [id number] S: Feeling better this morning. O:BP (!) 160/57 (BP Location: Left Wrist)   Pulse 74   Temp 98.6 F (37 C) (Oral)   Resp 17   Ht 5\' 2"  (1.575 m)   Wt 51 kg   SpO2 100%   BMI 20.56 kg/m   Intake/Output Summary (Last 24 hours) at 06/05/2023 0855 Last data filed at 06/04/2023 1819 Gross per 24 hour  Intake --  Output 2 ml  Net -2 ml   Intake/Output: I/O last 3 completed shifts: In: -  Out: 3 [Stool:3]  Intake/Output this shift:  No intake/output data recorded. Weight change:  Gen: NAD CVS: RRR Resp:CTA Abd: +BS, soft, NT/ND Ext: no edema  Recent Labs  Lab 05/31/23 1256 06/01/23 1220 06/03/23 0958 06/04/23 0529  NA 131* 131*  132* 134* 133*  K 2.8* 3.6  3.6 3.8 4.0  CL 97* 96*  96* 97* 99  CO2 25 27  27 27 29   GLUCOSE 175* 105*  104* 83 81  BUN 13 17  17 17 11   CREATININE 4.41* 5.12*  5.15* 4.70* 3.07*  ALBUMIN  --  1.9* 1.7*  --   CALCIUM 8.1* 8.3*  8.5* 8.3* 8.1*  PHOS  --  4.3 3.3  --    Liver Function Tests: Recent Labs  Lab 06/01/23 1220 06/03/23 0958  ALBUMIN 1.9* 1.7*   No results for input(s): "LIPASE", "AMYLASE" in the last 168 hours. No results for input(s): "AMMONIA" in the last 168 hours. CBC: Recent Labs  Lab 05/31/23 1256 06/01/23 1220 06/03/23 1000 06/04/23 0529  WBC 4.1 4.3 3.6* 4.5  NEUTROABS  --   --   --  2.9  HGB 7.0* 7.0* 6.4* 9.2*  HCT 20.7* 20.7* 19.3* 27.1*  MCV 104.5* 104.5* 106.0* 99.6  PLT 236 246 226 210   Cardiac Enzymes: No results for input(s): "CKTOTAL", "CKMB", "CKMBINDEX", "TROPONINI" in the last 168 hours. CBG: No results for input(s): "GLUCAP" in the last 168 hours.  Iron Studies:  Recent Labs    06/03/23 0959  IRON 77  TIBC NOT CALCULATED  FERRITIN 691*   Studies/Results: No results found.  sodium chloride   Intravenous Once   sodium chloride   Intravenous Once   amiodarone  100 mg Oral Daily   Chlorhexidine  Gluconate Cloth  6 each Topical Q0600   Chlorhexidine Gluconate Cloth  6 each Topical Q0600   folic acid  1 mg Oral Daily   gabapentin  100 mg Oral QPM   hydrALAZINE  25 mg Oral BID   irbesartan  37.5 mg Oral Daily   levETIRAcetam  500 mg Oral Daily   levothyroxine  50 mcg Oral Q0600   pantoprazole  40 mg Oral Daily   rosuvastatin  10 mg Oral Daily    BMET    Component Value Date/Time   NA 133 (L) 06/04/2023 0529   K 4.0 06/04/2023 0529   CL 99 06/04/2023 0529   CO2 29 06/04/2023 0529   GLUCOSE 81 06/04/2023 0529   BUN 11 06/04/2023 0529   CREATININE 3.07 (H) 06/04/2023 0529   CREATININE 6.57 (H) 03/05/2019 1159   CALCIUM 8.1 (L) 06/04/2023 0529   CALCIUM 8.1 (L) 05/29/2013 1814   GFRNONAA 15 (L) 06/04/2023 0529   GFRNONAA 6 (L) 03/05/2019 1159   GFRAA 9 (L) 02/12/2020 1712   GFRAA 6 (L) 03/05/2019 1159   CBC    Component  Value Date/Time   WBC 4.5 06/04/2023 0529   RBC 2.72 (L) 06/04/2023 0529   HGB 9.2 (L) 06/04/2023 0529   HCT 27.1 (L) 06/04/2023 0529   PLT 210 06/04/2023 0529   MCV 99.6 06/04/2023 0529   MCH 33.8 06/04/2023 0529   MCHC 33.9 06/04/2023 0529   RDW 26.6 (H) 06/04/2023 0529   LYMPHSABS 0.7 06/04/2023 0529   MONOABS 0.5 06/04/2023 0529   EOSABS 0.3 06/04/2023 0529   BASOSABS 0.1 06/04/2023 0529    Dialysis Orders:  MWF at [facility] 3 hr 30 min EDW 55kg TDC 2K /2.5Ca BFR 300 DFR 500 Calcitrol 0.58mcg PO q HD Mircera 200 mcg IV q 2 weeks-  Assessment/Plan: 1 weakness/ symptomatic anemia:  - Pt with chronic GIB due to AVM's and multiple admissions for blood transfusions.  She has antibodies, will take some time to crossmatch.  Hgb improved to 9.2 and feeling better.   2 ESRD: was off schedule due to missed HD.  Plan for HD today to get back on her outpatient schedule.  No heparin. 3 Hypertension/ volume: CXR with bilateral pleural effusions- will UF as tolerated 4. Anemia of ESRD: received Aranesp 5. Metabolic Bone Disease: calcitriol with HD,  binders as well 6.  Dispo: pending, hopeful discharge to home after HD.   Irena Cords, MD [employer] [phone]

## 2023-06-05 NOTE — TOC Transition Note (Signed)
Transition of Care [facility]) - CM/SW Discharge Note   Patient Details  Name: Shawna [name] MRN: [id number] Date of Birth: October 17, 1940  [facility]) CM/SW Contact:  Elliot Gault, LCSW Phone Number: 06/05/2023, 1:06 PM   Clinical Narrative:     Pt stable for dc today per MD. Updated Destiny at [facility] and they can admit pt today.   Updated pt's husband who remains in agreement with dc plan. He states he would like to transport. Updated RN who indicated that he should be able to do this.  DC clinical sent electronically. RN to call report.  No other TOC needs for dc.  Final next level of care: Skilled Nursing Facility Barriers to Discharge: Barriers Resolved   Patient Goals and CMS Choice CMS Medicare.gov Compare Post Acute Care list provided to:: Patient Represenative (must comment) Choice offered to / list presented to : Spouse  Discharge Placement                Patient chooses bed at: [facility] Patient to be transferred to facility by: spouse Name of family member notified: Ornella Hartfield Patient and family notified of of transfer: 06/05/23  Discharge Plan and Services Additional resources added to the After Visit Summary for   In-house Referral: Clinical Social Work   Post Acute Care Choice: Skilled Nursing Facility                               Social Determinants of Health (SDOH) Interventions SDOH Screenings   Food Insecurity: No Food Insecurity (05/31/2023)  Housing: Low Risk  (05/31/2023)  Transportation Needs: No Transportation Needs (05/31/2023)  Utilities: Not At Risk (05/31/2023)  Financial Resource Strain: Low Risk  (12/14/2018)  Physical Activity: Unknown (12/14/2018)  Social Connections: Unknown (12/14/2018)  Stress: No Stress Concern Present (12/14/2018)  Tobacco Use: Low Risk  (05/31/2023)  Health Literacy: Adequate Health Literacy (05/04/2023)   Received from [facility]  Recent Concern: Health Literacy -  Inadequate Health Literacy (02/04/2023)   Received from [facility]     Readmission Risk Interventions    05/02/2023    2:17 PM 01/29/2023   11:08 AM 10/31/2022   12:45 PM  Readmission Risk Prevention Plan  Transportation Screening Complete Complete Complete  Medication Review Oceanographer) Complete Complete Complete  PCP or Specialist appointment within 3-5 days of discharge  Complete   HRI or Home Care Consult Complete Complete Complete  SW Recovery Care/Counseling Consult Complete Complete Complete  Palliative Care Screening Not Applicable Not Applicable Not Applicable  Skilled Nursing Facility Not Applicable Not Applicable Not Applicable

## 2023-06-05 NOTE — Progress Notes (Signed)
   HEMODIALYSIS TREATMENT NOTE:  Uneventful 3.5 hour heparin-free treatment completed using left internal jugular TDC. Goal met: 1 liter removed without interruption in UF.  All blood was returned.  No meds given. 10am meds were r/s for 1400--offered to pt post-HD but she wants to eat lunch first.    06/05/23 1345  Vitals  Temp 98 F (36.7 C)  Temp Source Oral  BP (!) 156/67  MAP (mmHg) 83  BP Location Right Arm  BP Method Automatic  Patient Position (if appropriate) Lying  Pulse Rate 81  Pulse Rate Source Monitor  ECG Heart Rate 81  Resp 16  Oxygen Therapy  SpO2 100 %  O2 Device Room Air  Post Treatment  Dialyzer Clearance Lightly streaked  Hemodialysis Intake (mL) 0 mL  Liters Processed 79.7  Fluid Removed (mL) 1000 mL  Tolerated HD Treatment Yes  Post-Hemodialysis Comments Goal met  Hemodialysis Catheter Left Subclavian Double lumen Temporary (Non-Tunneled)  Placement Date: 06/09/22   Placed prior to admission: Yes  Orientation: Left  Access Location: Subclavian  Hemodialysis Catheter Type: Double lumen Temporary (Non-Tunneled)  Site Condition No complications  Blue Lumen Status Flushed;Heparin locked;Dead end cap in place  Red Lumen Status Flushed;Heparin locked;Dead end cap in place  Purple Lumen Status N/A  Catheter fill solution Heparin 1000 units/ml  Catheter fill volume (Arterial) 1.9 cc  Catheter fill volume (Venous) 1.9  Dressing Type Transparent;Tube stabilization device  Dressing Status Antimicrobial disc in place;Clean, Dry, Intact  Interventions Dressing reinforced  Drainage Description None  Dressing Change Due 06/10/23  Post treatment catheter status Capped and Clamped    Arman Filter, RN AP [facility]

## 2023-06-05 NOTE — Discharge Summary (Signed)
Physician Discharge Summary  Shawna [name]:[id number] DOB: 1941-02-16 DOA: 05/31/2023  PCP: [facility]  Admit date: 05/31/2023 Discharge date: 06/05/2023  Admitted From: Home Disposition: Skilled nursing facility  Recommendations for Outpatient Follow-up:  Follow up with PCP in 1-2 weeks Please obtain BMP/CBC in one week Hemodialysis and her schedule   Discharge Condition: Stable CODE STATUS: Full code Diet recommendation: Low-salt diet  Discharge summary: 82 year old with history of ESRD on HD who was feeling progressively weak since about a week.she missed her HD Monday due to weakness. Was complaining of cough and dyspnea. In ER hemoglobin was 7, negative fecal occult . Potassium was 2.8.  Patient was ultimately admitted to the hospital.  She underwent routine hemodialysis along with blood transfusions with clinical improvement.  She is going to a SNF today.  There was no evidence of active bleeding.  Her anemia is likely due to ESRD.  Symptomatic anemia, anemia of chronic kidney disease: Presented with generalized weakness, deconditioning missed hemodialysis.  Received total 2 units of PRBC with improvement.  Hemoglobin stabilized since then.  Will need close follow-up.  ESRD: Dialysis on his schedule.  She will be dialyzed before discharge today.  Atrial tachycardia/paroxysmal atrial fibrillation and flutter: Rhythm controlled on amiodarone and metoprolol.  She is not on anticoagulation due to recurrent GI bleeding.  Chronic systolic congestive heart failure Coronary artery status post CABG Euvolemic.  Clinically stable. Hyperlipidemia, on a statin Type 2 diabetes, diet controlled.  Not on treatment Hypothyroidism: On Synthroid.  Continued.  Medically stable for discharge to a skilled level of care.  Discharge Diagnoses:  Principal Problem:   FTT (failure to thrive) in adult Active Problems:   Acute on chronic blood loss anemia   Acquired  hypothyroidism   Chronic combined systolic and diastolic CHF (congestive heart failure) (HCC)   CAD (coronary artery disease)   Anemia of chronic disease   Symptomatic anemia    Discharge Instructions  Discharge Instructions     Diet - low sodium heart healthy   Complete by: As directed    Increase activity slowly   Complete by: As directed    No wound care   Complete by: As directed       Allergies as of 06/05/2023       Reactions   Amlodipine Swelling   Aspirin Other (See Comments)   High Doses Mess up her stomach; "makes my bowels have blood in them". Takes 81 mg EC Aspirin    Nitrofurantoin Hives   Ranexa [ranolazine] Other (See Comments)   Myoclonus-hospitalized    Bactrim [sulfamethoxazole-trimethoprim] Rash   Iodinated Contrast Media Itching, Other (See Comments)   Iron Itching, Other (See Comments)   "they gave me iron in dialysis; had to give me Benadryl cause I had to have the iron" (05/02/2012)   Tylenol [acetaminophen] Itching, Other (See Comments)   Makes her feet on fire per pt - can tolerate per pt   Gabapentin Other (See Comments)   Unknown reaction   Sucroferric Oxyhydroxide Other (See Comments)   Unknown   Levaquin [levofloxacin In D5w] Rash   Levofloxacin Rash, Other (See Comments)   Plavix [clopidogrel Bisulfate] Rash   Protonix [pantoprazole Sodium] Rash   Venofer [ferric Oxide] Itching, Other (See Comments)   Patient reports using Benadryl prior to doses as [facility]        Medication List     STOP taking these medications    benzonatate 100 MG capsule Commonly known  as: TESSALON       TAKE these medications    ALPRAZolam 0.5 MG tablet Commonly known as: XANAX Take 1 tablet (0.5 mg total) by mouth daily as needed for up to 7 days for anxiety.   amiodarone 200 MG tablet Commonly known as: PACERONE Take 200 mg by mouth daily.   calcitRIOL 0.25 MCG capsule Commonly known as: ROCALTROL Take 7 capsules (1.75 mcg total) by  mouth every Monday, Wednesday, and Friday with hemodialysis.   cinacalcet 30 MG tablet Commonly known as: SENSIPAR Take 1 tablet (30 mg total) by mouth every Monday, Wednesday, and Friday with hemodialysis.   cyanocobalamin 1000 MCG/ML injection Commonly known as: VITAMIN B12 Inject 1,000 mcg into the muscle every 30 (thirty) days.   cyclobenzaprine 5 MG tablet Commonly known as: FLEXERIL Take 5 mg by mouth 3 (three) times daily as needed for muscle spasms.   folic acid 1 MG tablet Commonly known as: FOLVITE Take 1 mg by mouth daily.   gabapentin 100 MG capsule Commonly known as: NEURONTIN Take 1 capsule (100 mg total) by mouth every evening.   hydrALAZINE 25 MG tablet Commonly known as: APRESOLINE Take 1 tablet (25 mg total) by mouth 2 (two) times daily. Hold for sbp <110   hydrOXYzine 25 MG tablet Commonly known as: ATARAX Take 25 mg by mouth every 6 (six) hours as needed for itching.   levETIRAcetam 500 MG tablet Commonly known as: KEPPRA Take 1 tablet (500 mg total) by mouth daily.   levothyroxine 75 MCG tablet Commonly known as: SYNTHROID Take 1 tablet (75 mcg total) by mouth daily at 6 (six) AM.   metoprolol tartrate 25 MG tablet Commonly known as: LOPRESSOR Take 0.5 tablets (12.5 mg total) by mouth 2 (two) times daily.   multivitamin Tabs tablet Take 1 tablet by mouth daily.   nitroGLYCERIN 0.4 MG SL tablet Commonly known as: NITROSTAT Place 0.4 mg under the tongue every 5 (five) minutes as needed for chest pain.   olmesartan 20 MG tablet Commonly known as: BENICAR Take 20 mg by mouth daily.   omeprazole 40 MG capsule Commonly known as: PRILOSEC Take 40 mg by mouth daily.   ondansetron 4 MG disintegrating tablet Commonly known as: ZOFRAN-ODT Take 4 mg by mouth every 8 (eight) hours as needed for nausea or vomiting.   rosuvastatin 10 MG tablet Commonly known as: CRESTOR Take 1 tablet by mouth once daily   SandoSTATIN LAR [place] 20 MG  injection Generic drug: octreotide Inject 20 mg into the muscle every 28 (twenty-eight) days.        Contact information for after-discharge care     Destination     [facility] Preferred SNF .   Service: Skilled Nursing Contact information: [address] [place] [address] [phone]                    Allergies  Allergen Reactions   Amlodipine Swelling   Aspirin Other (See Comments)    High Doses Mess up her stomach; "makes my bowels have blood in them". Takes 81 mg EC Aspirin    Nitrofurantoin Hives   Ranexa [Ranolazine] Other (See Comments)    Myoclonus-hospitalized    Bactrim [Sulfamethoxazole-Trimethoprim] Rash   Iodinated Contrast Media Itching and Other (See Comments)   Iron Itching and Other (See Comments)    "they gave me iron in dialysis; had to give me Benadryl cause I had to have the iron" (05/02/2012)   Tylenol [  Acetaminophen] Itching and Other (See Comments)    Makes her feet on fire per pt - can tolerate per pt   Gabapentin Other (See Comments)    Unknown reaction   Sucroferric Oxyhydroxide Other (See Comments)    Unknown   Levaquin [Levofloxacin In D5w] Rash   Levofloxacin Rash and Other (See Comments)   Plavix [Clopidogrel Bisulfate] Rash   Protonix [Pantoprazole Sodium] Rash   Venofer [Ferric Oxide] Itching and Other (See Comments)    Patient reports using Benadryl prior to doses as [facility]    Consultations: Nephrology   Procedures/Studies: DG Chest Portable 1 View  Result Date: 05/31/2023 CLINICAL DATA:  Weakness.  End-stage renal disease on dialysis. EXAM: PORTABLE CHEST 1 VIEW COMPARISON:  Chest radiograph dated May 13, 2023. FINDINGS: Stable cardiomegaly. Stable left-sided central venous dialysis catheter. Prior median sternotomy and CABG. Similar small bilateral layering pleural effusions with basilar atelectasis. Diffuse interstitial prominence. No pneumothorax. No acute osseous  abnormality. IMPRESSION: Cardiomegaly with similar diffuse interstitial edema and small bilateral pleural effusions. Electronically Signed   By: Hart Robinsons M.D.   On: 05/31/2023 17:46   DG Chest Port 1 View  Result Date: 05/13/2023 CLINICAL DATA:  Shortness of breath. Peripheral edema. End-stage renal disease on dialysis. EXAM: PORTABLE CHEST 1 VIEW COMPARISON:  05/10/2023 FINDINGS: Stable cardiomegaly. Left-sided dual-lumen central venous dialysis catheter remains in appropriate position. Prior CABG again noted. Increased diffuse interstitial infiltrates, consistent with interstitial edema. Small bilateral pleural effusions and bibasilar atelectasis, without significant change. IMPRESSION: Increased diffuse interstitial edema. Stable small bilateral pleural effusions and bibasilar atelectasis. Stable cardiomegaly. Electronically Signed   By: Danae Orleans M.D.   On: 05/13/2023 14:50   DG Chest 2 View  Result Date: 05/10/2023 CLINICAL DATA:  Weakness. EXAM: CHEST - 2 VIEW COMPARISON:  May 01, 2023 chest x-ray. FINDINGS: Similar small bilateral pleural effusions. Similar overlying bibasilar opacities. Enlarged cardiac silhouette. No visible pneumothorax. CABG and median sternotomy. Left dual lumen central venous catheter with the tip projecting at the right atrium. Polyarticular degenerative change including severe right shoulder degenerative change. IMPRESSION: 1. Similar small bilateral pleural effusions with overlying bibasilar opacities that could represent atelectasis, aspiration, and/or pneumonia. 2. Similar cardiomegaly. Electronically Signed   By: Feliberto Harts M.D.   On: 05/10/2023 16:30   (Echo, Carotid, EGD, Colonoscopy, ERCP)    Subjective: Patient seen and examined.  Denies any complaints.  Husband on the phone.   Discharge Exam: Vitals:   06/04/23 2048 06/05/23 0413  BP: (!) 174/64 (!) 160/57  Pulse: 77 74  Resp:  17  Temp: 98.5 F (36.9 C) 98.6 F (37 C)  SpO2:  100% 100%   Vitals:   06/04/23 0444 06/04/23 1203 06/04/23 2048 06/05/23 0413  BP: (!) 173/64 (!) 143/54 (!) 174/64 (!) 160/57  Pulse: 75 73 77 74  Resp:    17  Temp: 98.8 F (37.1 C) 98.6 F (37 C) 98.5 F (36.9 C) 98.6 F (37 C)  TempSrc: Oral  Oral Oral  SpO2: 100% 98% 100% 100%  Weight:      Height:        General: Pt is alert, awake, not in acute distress Frail and debilitated.  Chronically sick looking.  On room air.  Not in distress. Cardiovascular: RRR, S1/S2 +, no rubs, no gallops, left chest wall permacath present. Respiratory: CTA bilaterally, no wheezing, no rhonchi Abdominal: Soft, NT, ND, bowel sounds + Extremities: no edema, no cyanosis    The results of significant  diagnostics from this hospitalization (including imaging, microbiology, ancillary and laboratory) are listed below for reference.     Microbiology: Recent Results (from the past 240 hour(s))  Resp panel by RT-PCR (RSV, Flu A&B, Covid) Anterior Nasal Swab     Status: None   Collection Time: 05/31/23  5:41 PM   Specimen: Anterior Nasal Swab  Result Value Ref Range Status   SARS Coronavirus 2 by RT PCR NEGATIVE NEGATIVE Final    Comment: (NOTE) SARS-CoV-2 target nucleic acids are NOT DETECTED.  The SARS-CoV-2 RNA is generally detectable in upper respiratory specimens during the acute phase of infection. The lowest concentration of SARS-CoV-2 viral copies this assay can detect is 138 copies/mL. A negative result does not preclude SARS-Cov-2 infection and should not be used as the sole basis for treatment or other patient management decisions. A negative result may occur with  improper specimen collection/handling, submission of specimen other than nasopharyngeal swab, presence of viral mutation(s) within the areas targeted by this assay, and inadequate number of viral copies(<138 copies/mL). A negative result must be combined with clinical observations, patient history, and  epidemiological information. The expected result is Negative.  Fact Sheet for Patients:  BloggerCourse.com  Fact Sheet for Healthcare Providers:  SeriousBroker.it  This test is no t yet approved or cleared by the [place] FDA and  has been authorized for detection and/or diagnosis of SARS-CoV-2 by FDA under an Emergency Use Authorization (EUA). This EUA will remain  in effect (meaning this test can be used) for the duration of the COVID-19 declaration under Section 564(b)(1) of the Act, 21 U.S.C.section 360bbb-3(b)(1), unless the authorization is terminated  or revoked sooner.       Influenza A by PCR NEGATIVE NEGATIVE Final   Influenza B by PCR NEGATIVE NEGATIVE Final    Comment: (NOTE) The Xpert Xpress SARS-CoV-2/FLU/RSV plus assay is intended as an aid in the diagnosis of influenza from Nasopharyngeal swab specimens and should not be used as a sole basis for treatment. Nasal washings and aspirates are unacceptable for Xpert Xpress SARS-CoV-2/FLU/RSV testing.  Fact Sheet for Patients: BloggerCourse.com  Fact Sheet for Healthcare Providers: SeriousBroker.it  This test is not yet approved or cleared by the [place] FDA and has been authorized for detection and/or diagnosis of SARS-CoV-2 by FDA under an Emergency Use Authorization (EUA). This EUA will remain in effect (meaning this test can be used) for the duration of the COVID-19 declaration under Section 564(b)(1) of the Act, 21 U.S.C. section 360bbb-3(b)(1), unless the authorization is terminated or revoked.     Resp Syncytial Virus by PCR NEGATIVE NEGATIVE Final    Comment: (NOTE) Fact Sheet for Patients: BloggerCourse.com  Fact Sheet for Healthcare Providers: SeriousBroker.it  This test is not yet approved or cleared by the [place] FDA and has been  authorized for detection and/or diagnosis of SARS-CoV-2 by FDA under an Emergency Use Authorization (EUA). This EUA will remain in effect (meaning this test can be used) for the duration of the COVID-19 declaration under Section 564(b)(1) of the Act, 21 U.S.C. section 360bbb-3(b)(1), unless the authorization is terminated or revoked.  Performed at [facility], [address] [place], [place] [address]      Labs: BNP (last 3 results) Recent Labs    08/26/22 1410 10/23/22 1420 05/13/23 1500  BNP 1,590.0* 2,205.0* 3,351.0*   Basic Metabolic Panel: Recent Labs  Lab 05/31/23 1256 06/01/23 1220 06/03/23 0958 06/04/23 0529  NA 131* 131*  132* 134* 133*  K 2.8* 3.6  3.6 3.8 4.0  CL 97* 96*  96* 97* 99  CO2 25 27  27 27 29   GLUCOSE 175* 105*  104* 83 81  BUN 13 17  17 17 11   CREATININE 4.41* 5.12*  5.15* 4.70* 3.07*  CALCIUM 8.1* 8.3*  8.5* 8.3* 8.1*  MG 1.5*  --   --   --   PHOS  --  4.3 3.3  --    Liver Function Tests: Recent Labs  Lab 06/01/23 1220 06/03/23 0958  ALBUMIN 1.9* 1.7*   No results for input(s): "LIPASE", "AMYLASE" in the last 168 hours. No results for input(s): "AMMONIA" in the last 168 hours. CBC: Recent Labs  Lab 05/31/23 1256 06/01/23 1220 06/03/23 1000 06/04/23 0529  WBC 4.1 4.3 3.6* 4.5  NEUTROABS  --   --   --  2.9  HGB 7.0* 7.0* 6.4* 9.2*  HCT 20.7* 20.7* 19.3* 27.1*  MCV 104.5* 104.5* 106.0* 99.6  PLT 236 246 226 210   Cardiac Enzymes: No results for input(s): "CKTOTAL", "CKMB", "CKMBINDEX", "TROPONINI" in the last 168 hours. BNP: Invalid input(s): "POCBNP" CBG: No results for input(s): "GLUCAP" in the last 168 hours. D-Dimer No results for input(s): "DDIMER" in the last 72 hours. Hgb A1c No results for input(s): "HGBA1C" in the last 72 hours. Lipid Profile No results for input(s): "CHOL", "HDL", "LDLCALC", "TRIG", "CHOLHDL", "LDLDIRECT" in the last 72 hours. Thyroid function studies No results for input(s): "TSH",  "T4TOTAL", "T3FREE", "THYROIDAB" in the last 72 hours.  Invalid input(s): "FREET3" Anemia work up Recent Labs    06/03/23 0959  FERRITIN 691*  TIBC NOT CALCULATED  IRON 77   Urinalysis    Component Value Date/Time   COLORURINE YELLOW 12/16/2012 1919   APPEARANCEUR CLOUDY (A) 12/16/2012 1919   LABSPEC 1.009 12/16/2012 1919   PHURINE 7.5 12/16/2012 1919   GLUCOSEU NEGATIVE 12/16/2012 1919   HGBUR TRACE (A) 12/16/2012 1919   BILIRUBINUR NEGATIVE 12/16/2012 1919   KETONESUR NEGATIVE 12/16/2012 1919   PROTEINUR 100 (A) 12/16/2012 1919   UROBILINOGEN 0.2 12/16/2012 1919   NITRITE NEGATIVE 12/16/2012 1919   LEUKOCYTESUR SMALL (A) 12/16/2012 1919   Sepsis Labs Recent Labs  Lab 05/31/23 1256 06/01/23 1220 06/03/23 1000 06/04/23 0529  WBC 4.1 4.3 3.6* 4.5   Microbiology Recent Results (from the past 240 hour(s))  Resp panel by RT-PCR (RSV, Flu A&B, Covid) Anterior Nasal Swab     Status: None   Collection Time: 05/31/23  5:41 PM   Specimen: Anterior Nasal Swab  Result Value Ref Range Status   SARS Coronavirus 2 by RT PCR NEGATIVE NEGATIVE Final    Comment: (NOTE) SARS-CoV-2 target nucleic acids are NOT DETECTED.  The SARS-CoV-2 RNA is generally detectable in upper respiratory specimens during the acute phase of infection. The lowest concentration of SARS-CoV-2 viral copies this assay can detect is 138 copies/mL. A negative result does not preclude SARS-Cov-2 infection and should not be used as the sole basis for treatment or other patient management decisions. A negative result may occur with  improper specimen collection/handling, submission of specimen other than nasopharyngeal swab, presence of viral mutation(s) within the areas targeted by this assay, and inadequate number of viral copies(<138 copies/mL). A negative result must be combined with clinical observations, patient history, and epidemiological information. The expected result is Negative.  Fact Sheet for  Patients:  BloggerCourse.com  Fact Sheet for Healthcare Providers:  SeriousBroker.it  This test is no t yet approved or cleared by the [place] and  has been authorized for detection and/or diagnosis of SARS-CoV-2 by FDA under an Emergency Use Authorization (EUA). This EUA will remain  in effect (meaning this test can be used) for the duration of the COVID-19 declaration under Section 564(b)(1) of the Act, 21 U.S.C.section 360bbb-3(b)(1), unless the authorization is terminated  or revoked sooner.       Influenza A by PCR NEGATIVE NEGATIVE Final   Influenza B by PCR NEGATIVE NEGATIVE Final    Comment: (NOTE) The Xpert Xpress SARS-CoV-2/FLU/RSV plus assay is intended as an aid in the diagnosis of influenza from Nasopharyngeal swab specimens and should not be used as a sole basis for treatment. Nasal washings and aspirates are unacceptable for Xpert Xpress SARS-CoV-2/FLU/RSV testing.  Fact Sheet for Patients: BloggerCourse.com  Fact Sheet for Healthcare Providers: SeriousBroker.it  This test is not yet approved or cleared by the [place] FDA and has been authorized for detection and/or diagnosis of SARS-CoV-2 by FDA under an Emergency Use Authorization (EUA). This EUA will remain in effect (meaning this test can be used) for the duration of the COVID-19 declaration under Section 564(b)(1) of the Act, 21 U.S.C. section 360bbb-3(b)(1), unless the authorization is terminated or revoked.     Resp Syncytial Virus by PCR NEGATIVE NEGATIVE Final    Comment: (NOTE) Fact Sheet for Patients: BloggerCourse.com  Fact Sheet for Healthcare Providers: SeriousBroker.it  This test is not yet approved or cleared by the [place] FDA and has been authorized for detection and/or diagnosis of SARS-CoV-2 by FDA under an Emergency  Use Authorization (EUA). This EUA will remain in effect (meaning this test can be used) for the duration of the COVID-19 declaration under Section 564(b)(1) of the Act, 21 U.S.C. section 360bbb-3(b)(1), unless the authorization is terminated or revoked.  Performed at [facility], [address] [place], [place] [address]      Time coordinating discharge: 32 minutes  SIGNED:   Dorcas Carrow, MD  [facility] 06/05/2023, 10:01 AM

## 2023-07-10 ENCOUNTER — Other Ambulatory Visit: Payer: Self-pay

## 2023-07-10 ENCOUNTER — Observation Stay (HOSPITAL_COMMUNITY)
Admission: EM | Admit: 2023-07-10 | Discharge: 2023-07-12 | Disposition: A | Discharge status: Home / Self Care | Payer: Medicare Other | Attending: Internal Medicine | Admitting: Internal Medicine

## 2023-07-10 ENCOUNTER — Encounter (HOSPITAL_COMMUNITY): Payer: Self-pay | Admitting: *Deleted

## 2023-07-10 DIAGNOSIS — I480 Paroxysmal atrial fibrillation: Secondary | ICD-10-CM | POA: Diagnosis present

## 2023-07-10 DIAGNOSIS — Z992 Dependence on renal dialysis: Secondary | ICD-10-CM

## 2023-07-10 DIAGNOSIS — E1121 Type 2 diabetes mellitus with diabetic nephropathy: Secondary | ICD-10-CM | POA: Insufficient documentation

## 2023-07-10 DIAGNOSIS — E1122 Type 2 diabetes mellitus with diabetic chronic kidney disease: Secondary | ICD-10-CM | POA: Insufficient documentation

## 2023-07-10 DIAGNOSIS — I2510 Atherosclerotic heart disease of native coronary artery without angina pectoris: Secondary | ICD-10-CM | POA: Insufficient documentation

## 2023-07-10 DIAGNOSIS — Z85038 Personal history of other malignant neoplasm of large intestine: Secondary | ICD-10-CM | POA: Diagnosis not present

## 2023-07-10 DIAGNOSIS — Z8673 Personal history of transient ischemic attack (TIA), and cerebral infarction without residual deficits: Secondary | ICD-10-CM | POA: Insufficient documentation

## 2023-07-10 DIAGNOSIS — Z79899 Other long term (current) drug therapy: Secondary | ICD-10-CM | POA: Insufficient documentation

## 2023-07-10 DIAGNOSIS — Z951 Presence of aortocoronary bypass graft: Secondary | ICD-10-CM | POA: Insufficient documentation

## 2023-07-10 DIAGNOSIS — R2689 Other abnormalities of gait and mobility: Secondary | ICD-10-CM | POA: Diagnosis not present

## 2023-07-10 DIAGNOSIS — E782 Mixed hyperlipidemia: Secondary | ICD-10-CM | POA: Diagnosis not present

## 2023-07-10 DIAGNOSIS — R531 Weakness: Secondary | ICD-10-CM | POA: Diagnosis present

## 2023-07-10 DIAGNOSIS — Z8543 Personal history of malignant neoplasm of ovary: Secondary | ICD-10-CM | POA: Diagnosis not present

## 2023-07-10 DIAGNOSIS — E039 Hypothyroidism, unspecified: Secondary | ICD-10-CM | POA: Diagnosis present

## 2023-07-10 DIAGNOSIS — R2681 Unsteadiness on feet: Secondary | ICD-10-CM | POA: Diagnosis not present

## 2023-07-10 DIAGNOSIS — R001 Bradycardia, unspecified: Secondary | ICD-10-CM | POA: Insufficient documentation

## 2023-07-10 DIAGNOSIS — I132 Hypertensive heart and chronic kidney disease with heart failure and with stage 5 chronic kidney disease, or end stage renal disease: Secondary | ICD-10-CM | POA: Diagnosis not present

## 2023-07-10 DIAGNOSIS — D649 Anemia, unspecified: Secondary | ICD-10-CM | POA: Diagnosis present

## 2023-07-10 DIAGNOSIS — M6281 Muscle weakness (generalized): Secondary | ICD-10-CM | POA: Insufficient documentation

## 2023-07-10 DIAGNOSIS — D539 Nutritional anemia, unspecified: Secondary | ICD-10-CM | POA: Diagnosis present

## 2023-07-10 DIAGNOSIS — Z950 Presence of cardiac pacemaker: Secondary | ICD-10-CM | POA: Insufficient documentation

## 2023-07-10 DIAGNOSIS — N186 End stage renal disease: Principal | ICD-10-CM | POA: Insufficient documentation

## 2023-07-10 DIAGNOSIS — I5042 Chronic combined systolic (congestive) and diastolic (congestive) heart failure: Secondary | ICD-10-CM | POA: Diagnosis present

## 2023-07-10 DIAGNOSIS — Z955 Presence of coronary angioplasty implant and graft: Secondary | ICD-10-CM | POA: Insufficient documentation

## 2023-07-10 DIAGNOSIS — I10 Essential (primary) hypertension: Secondary | ICD-10-CM | POA: Diagnosis present

## 2023-07-10 DIAGNOSIS — D500 Iron deficiency anemia secondary to blood loss (chronic): Secondary | ICD-10-CM | POA: Insufficient documentation

## 2023-07-10 LAB — BASIC METABOLIC PANEL
Anion gap: 12 (ref 5–15)
BUN: 34 mg/dL — ABNORMAL HIGH (ref 8–23)
CO2: 22 mmol/L (ref 22–32)
Calcium: 8.9 mg/dL (ref 8.9–10.3)
Chloride: 99 mmol/L (ref 98–111)
Creatinine, Ser: 5.84 mg/dL — ABNORMAL HIGH (ref 0.44–1.00)
GFR, Estimated: 7 mL/min — ABNORMAL LOW (ref >60)
Glucose, Bld: 107 mg/dL — ABNORMAL HIGH (ref 70–99)
Potassium: 3.6 mmol/L (ref 3.5–5.1)
Sodium: 133 mmol/L — ABNORMAL LOW (ref 135–145)

## 2023-07-10 LAB — CBC WITH DIFFERENTIAL/PLATELET
Abs Immature Granulocytes: 0.02 10*3/uL (ref 0.00–0.07)
Basophils Absolute: 0.1 10*3/uL (ref 0.0–0.1)
Basophils Relative: 1 %
Eosinophils Absolute: 0.1 10*3/uL (ref 0.0–0.5)
Eosinophils Relative: 3 %
HCT: 21 % — ABNORMAL LOW (ref 36.0–46.0)
Hemoglobin: 6.8 g/dL — CL (ref 12.0–15.0)
Immature Granulocytes: 1 %
Lymphocytes Relative: 15 %
Lymphs Abs: 0.6 10*3/uL — ABNORMAL LOW (ref 0.7–4.0)
MCH: 37.8 pg — ABNORMAL HIGH (ref 26.0–34.0)
MCHC: 32.4 g/dL (ref 30.0–36.0)
MCV: 116.7 fL — ABNORMAL HIGH (ref 80.0–100.0)
Monocytes Absolute: 0.4 10*3/uL (ref 0.1–1.0)
Monocytes Relative: 10 %
Neutro Abs: 2.9 10*3/uL (ref 1.7–7.7)
Neutrophils Relative %: 70 %
Platelets: 202 10*3/uL (ref 150–400)
RBC: 1.8 MIL/uL — ABNORMAL LOW (ref 3.87–5.11)
WBC: 4.2 10*3/uL (ref 4.0–10.5)
nRBC: 0 % (ref 0.0–0.2)

## 2023-07-10 LAB — POC OCCULT BLOOD, ED: Fecal Occult Bld: NEGATIVE

## 2023-07-10 LAB — PREPARE RBC (CROSSMATCH)

## 2023-07-10 MED ORDER — SODIUM CHLORIDE 0.9% IV SOLUTION: Freq: Once | INTRAVENOUS | Status: AC

## 2023-07-10 MED ORDER — CHLORHEXIDINE GLUCONATE CLOTH 2 % EX PADS
6.0000 | MEDICATED_PAD | Freq: Every day | CUTANEOUS | Status: DC
  Administered 2023-07-11 – 2023-07-12 (×2): 6 via TOPICAL

## 2023-07-10 NOTE — ED Provider Notes (Signed)
[place] EMERGENCY DEPARTMENT AT [facility] Provider Note   CSN: [id number] Arrival date & time: 07/10/23  1314     History  Chief Complaint  Patient presents with   Abnormal labs    Shawna [name] is a 82 y.o. female.  HPI Patient presents for abnormal outpatient lab work results.  Medical history includes CAD, HTN, anemia, ESRD, GERD, small bowel AVM, CHF, colon cancer, ovarian cancer.  She is currently cancer free and not on any cancer therapies.  She is not on a blood thinner due to history of GI bleed.  Hemoglobin 4 weeks ago was 8.1.  She was called today to inform her that her hemoglobin was low.  She was told that it was in the threes.  She states that she has had ongoing generalized weakness for the past month.  She denies any other recent symptoms.  She is unaware of any blood loss and denies any presence of dark stools.  Last dialysis session was 3 days ago.    Home Medications Prior to Admission medications   Medication Sig Start Date End Date Taking? Authorizing Provider  amiodarone (PACERONE) 200 MG tablet Take 200 mg by mouth daily. 05/31/23   [provider]  calcitRIOL (ROCALTROL) 0.25 MCG capsule Take 7 capsules (1.75 mcg total) by mouth every Monday, Wednesday, and Friday with hemodialysis. 12/21/22   Lanae Boast, MD  cinacalcet (SENSIPAR) 30 MG tablet Take 1 tablet (30 mg total) by mouth every Monday, Wednesday, and Friday with hemodialysis. 12/19/22   Lanae Boast, MD  cyanocobalamin (VITAMIN B12) 1000 MCG/ML injection Inject 1,000 mcg into the muscle every 30 (thirty) days.    [provider]  cyclobenzaprine (FLEXERIL) 5 MG tablet Take 5 mg by mouth 3 (three) times daily as needed for muscle spasms.    [provider]  folic acid (FOLVITE) 1 MG tablet Take 1 mg by mouth daily. 05/24/22   [provider]  gabapentin (NEURONTIN) 100 MG capsule Take 1 capsule (100 mg total) by mouth every evening. 06/05/23 07/05/23  Dorcas Carrow, MD  hydrALAZINE (APRESOLINE) 25 MG tablet Take 1 tablet (25 mg total) by mouth 2 (two) times daily. Hold for sbp <110 12/27/22   Lanae Boast, MD  hydrOXYzine (ATARAX) 25 MG tablet Take 25 mg by mouth every 6 (six) hours as needed for itching. 06/06/22   [provider]  levETIRAcetam (KEPPRA) 500 MG tablet Take 1 tablet (500 mg total) by mouth daily. 11/15/22   Leroy Sea, MD  levothyroxine (SYNTHROID) 75 MCG tablet Take 1 tablet (75 mcg total) by mouth daily at 6 (six) AM. 02/02/23   Sherryll Burger, Pratik D, DO  metoprolol tartrate (LOPRESSOR) 25 MG tablet Take 0.5 tablets (12.5 mg total) by mouth 2 (two) times daily. 02/01/23 06/01/23  Sherryll Burger, Pratik D, DO  multivitamin (RENA-VIT) TABS tablet Take 1 tablet by mouth daily.    [provider]  nitroGLYCERIN (NITROSTAT) 0.4 MG SL tablet Place 0.4 mg under the tongue every 5 (five) minutes as needed for chest pain.    [provider]  octreotide (SANDOSTATIN LAR) 20 MG injection Inject 20 mg into the muscle every 28 (twenty-eight) days. 11/15/22   Leroy Sea, MD  olmesartan (BENICAR) 20 MG tablet Take 20 mg by mouth daily. 01/14/23   [provider]  omeprazole (PRILOSEC) 40 MG capsule Take 40 mg by mouth daily. 05/31/23   [provider]  ondansetron (ZOFRAN-ODT) 4 MG disintegrating tablet Take 4 mg  by mouth every 8 (eight) hours as needed for nausea or vomiting. 05/22/23   [provider]  rosuvastatin (CRESTOR) 10 MG tablet Take 1 tablet by mouth once daily 06/06/22   Iran Ouch, [place] M, PA-C      Allergies    Amlodipine, Aspirin, Nitrofurantoin, Ranexa [ranolazine], Bactrim [sulfamethoxazole-trimethoprim], Iodinated contrast media, Iron, Tylenol [acetaminophen], Gabapentin, Sucroferric oxyhydroxide, Levaquin [levofloxacin in d5w], Levofloxacin, Plavix [clopidogrel bisulfate], Protonix [pantoprazole sodium], and Venofer [ferric oxide]    Review of Systems   Review of Systems  Constitutional:   Positive for fatigue.  Neurological:  Positive for weakness (Generalized).  All other systems reviewed and are negative.   Physical Exam Updated Vital Signs BP (!) 156/68 (BP Location: Right Wrist)   Pulse (!) 55   Temp 97.6 F (36.4 C) (Oral)   Resp (!) 21   Ht 5\' 2"  (1.575 m)   Wt 57.2 kg   SpO2 98%   BMI 23.06 kg/m  Physical Exam Vitals and nursing note reviewed.  Constitutional:      General: She is not in acute distress.    Appearance: Normal appearance. She is well-developed. She is not ill-appearing, toxic-appearing or diaphoretic.  HENT:     Head: Normocephalic and atraumatic.     Right Ear: External ear normal.     Left Ear: External ear normal.     Nose: Nose normal.     Mouth/Throat:     Mouth: Mucous membranes are moist.  Eyes:     Extraocular Movements: Extraocular movements intact.     Conjunctiva/sclera: Conjunctivae normal.  Cardiovascular:     Rate and Rhythm: Normal rate and regular rhythm.  Pulmonary:     Effort: Pulmonary effort is normal. No respiratory distress.  Abdominal:     General: There is no distension.     Palpations: Abdomen is soft.     Tenderness: There is no abdominal tenderness.  Musculoskeletal:        General: No swelling. Normal range of motion.     Cervical back: Normal range of motion and neck supple.  Skin:    General: Skin is warm and dry.     Coloration: Skin is not jaundiced or pale.  Neurological:     General: No focal deficit present.     Mental Status: She is alert and oriented to person, place, and time.  Psychiatric:        Mood and Affect: Mood normal.        Behavior: Behavior normal.     ED Results / Procedures / Treatments   Labs (all labs ordered are listed, but only abnormal results are displayed) Labs Reviewed  CBC WITH DIFFERENTIAL/PLATELET - Abnormal; Notable for the following components:      Result Value   RBC 1.80 (*)    Hemoglobin 6.8 (*)    HCT 21.0 (*)    MCV 116.7 (*)    MCH 37.8 (*)     Lymphs Abs 0.6 (*)    All other components within normal limits  BASIC METABOLIC PANEL - Abnormal; Notable for the following components:   Sodium 133 (*)    Glucose, Bld 107 (*)    BUN 34 (*)    Creatinine, Ser 5.84 (*)    GFR, Estimated 7 (*)    All other components within normal limits  HEPATITIS B SURFACE ANTIGEN  HEPATITIS B SURFACE ANTIBODY, QUANTITATIVE  POC OCCULT BLOOD, ED  TYPE AND SCREEN  PREPARE RBC (CROSSMATCH)    EKG EKG Interpretation  Date/Time:  Monday July 10 2023 20:40:10 EST Ventricular Rate:  45 PR Interval:  197 QRS Duration:  136 QT Interval:  558 QTC Calculation: 483 R Axis:   73  Text Interpretation: Sinus bradycardia IVCD, consider atypical LBBB Confirmed by Gloris Manchester [phone]) on 07/10/2023 9:08:02 PM  Radiology No results found.  Procedures Procedures    Medications Ordered in ED Medications  0.9 %  sodium chloride infusion (Manually program via Guardrails IV Fluids) (has no administration in time range)  Chlorhexidine Gluconate Cloth 2 % PADS 6 each (has no administration in time range)    ED Course/ Medical Decision Making/ A&P                                 Medical Decision Making Amount and/or Complexity of Data Reviewed Labs: ordered.  Risk Prescription drug management. Decision regarding hospitalization.   This patient presents to the ED for concern of fatigue and generalized weakness, this involves an extensive number of treatment options, and is a complaint that carries with it a high risk of complications and morbidity.  The differential diagnosis includes symptomatic anemia, symptomatic bradycardia, deconditioning, dehydration, polypharmacy, metabolic derangements   Co morbidities that complicate the patient evaluation  CAD, HTN, anemia, ESRD, GERD, small bowel AVM, CHF, colon cancer, ovarian cancer   Additional history obtained:  Additional history obtained from patient's husband External records from outside  source obtained and reviewed including EMR   Lab Tests:  I Ordered, and personally interpreted labs.  The pertinent results include: Acute on chronic anemia with hemoglobin of 6.8; elevated creatinine and BUN consistent with ESRD, no leukocytosis   Cardiac Monitoring: / EKG:  The patient was maintained on a cardiac monitor.  I personally viewed and interpreted the cardiac monitored which showed an underlying rhythm of: Sinus rhythm   Consultations Obtained:  I requested consultation with the cardiologist, Dr. Maximino Sarin,  and discussed lab and imaging findings as well as pertinent plan - they recommend: Observe tonight after blood transfusion and hold metoprolol.  Continue home dose of amiodarone.   Problem List / ED Course / Critical interventions / Medication management  Patient presenting for report of low hemoglobin.  This was identified on outpatient lab work.  Prior being bedded in the ED today, acute on chronic anemia was confirmed with hemoglobin of 6.8 today.  Per chart review, it was 8.1 4 weeks ago.  She denies any known recent blood loss.  She is overall well-appearing on arrival.  DRE was Hemoccult negative.  Patient was consented for transfusion.  1 unit PRBCs was ordered.  Current vital signs are notable for bradycardia.  Per chart review, it does appear the patient's typical heart rate is in the 70s.  It is in the 40s today.  Rhythm appears to be sinus.  She is on amiodarone and metoprolol.  I discussed this with cardiologist on-call, Dr. Maximino Sarin, who recommends holding metoprolol and transfusing PRBCs.  If she remains bradycardic or has any other dynamic EKG changes, cardiology to be reconsulted.  I spoke with nephrologist on-call, Dr. Juel Burrow, who will arrange for dialysis in the morning.  Patient was admitted for further management. I ordered medication including PRBCs for symptomatic anemia Reevaluation of the patient after these medicines showed that the patient improved I have  reviewed the patients home medicines and have made adjustments as needed   Social Determinants of Health:  Has access to outpatient care  CRITICAL CARE Performed by: Gloris Manchester   Total critical care time: 32 minutes  Critical care time was exclusive of separately billable procedures and treating other patients.  Critical care was necessary to treat or prevent imminent or life-threatening deterioration.  Critical care was time spent personally by me on the following activities: development of treatment plan with patient and/or surrogate as well as nursing, discussions with consultants, evaluation of patient's response to treatment, examination of patient, obtaining history from patient or surrogate, ordering and performing treatments and interventions, ordering and review of laboratory studies, ordering and review of radiographic studies, pulse oximetry and re-evaluation of patient's condition.        Final Clinical Impression(s) / ED Diagnoses Final diagnoses:  Generalized weakness  Symptomatic anemia  Sinus bradycardia    Rx / DC Orders ED Discharge Orders     None         Gloris Manchester, MD 07/11/23 0006

## 2023-07-10 NOTE — Progress Notes (Signed)
Patient not dialyzed at outpt center with center stating her Hb in the 3's and not able to dialyze, referring to ED. In the ED her Hb is actually 6.8.  Orders written for HD in the AM; pt to be seen tomorrow.  Dialysis Orders:  MWF at [facility] 3 hr 30 min EDW 55kg TDC 2K /2.5Ca BFR 300 DFR 500 Calcitrol 0.76mcg PO q HD Mircera 200 mcg IV q 2 weeks-

## 2023-07-10 NOTE — ED Triage Notes (Signed)
Pt called today and instructed to come here. Denies any CP or SOB.  + generalized weakness for weeks per husband.

## 2023-07-11 DIAGNOSIS — R001 Bradycardia, unspecified: Secondary | ICD-10-CM | POA: Diagnosis not present

## 2023-07-11 DIAGNOSIS — N186 End stage renal disease: Secondary | ICD-10-CM | POA: Diagnosis not present

## 2023-07-11 DIAGNOSIS — I480 Paroxysmal atrial fibrillation: Secondary | ICD-10-CM

## 2023-07-11 DIAGNOSIS — Z992 Dependence on renal dialysis: Secondary | ICD-10-CM

## 2023-07-11 DIAGNOSIS — R531 Weakness: Secondary | ICD-10-CM

## 2023-07-11 DIAGNOSIS — E782 Mixed hyperlipidemia: Secondary | ICD-10-CM

## 2023-07-11 DIAGNOSIS — D649 Anemia, unspecified: Secondary | ICD-10-CM

## 2023-07-11 DIAGNOSIS — I5043 Acute on chronic combined systolic (congestive) and diastolic (congestive) heart failure: Secondary | ICD-10-CM

## 2023-07-11 DIAGNOSIS — E039 Hypothyroidism, unspecified: Secondary | ICD-10-CM

## 2023-07-11 DIAGNOSIS — I10 Essential (primary) hypertension: Secondary | ICD-10-CM

## 2023-07-11 LAB — CBC
HCT: 17.9 % — ABNORMAL LOW (ref 36.0–46.0)
Hemoglobin: 5.9 g/dL — CL (ref 12.0–15.0)
MCH: 38.1 pg — ABNORMAL HIGH (ref 26.0–34.0)
MCHC: 33 g/dL (ref 30.0–36.0)
MCV: 115.5 fL — ABNORMAL HIGH (ref 80.0–100.0)
Platelets: 203 10*3/uL (ref 150–400)
RBC: 1.55 MIL/uL — ABNORMAL LOW (ref 3.87–5.11)
WBC: 3.9 10*3/uL — ABNORMAL LOW (ref 4.0–10.5)
nRBC: 0 % (ref 0.0–0.2)

## 2023-07-11 LAB — COMPREHENSIVE METABOLIC PANEL
ALT: 13 U/L (ref 0–44)
AST: 17 U/L (ref 15–41)
Albumin: 2.3 g/dL — ABNORMAL LOW (ref 3.5–5.0)
Alkaline Phosphatase: 76 U/L (ref 38–126)
Anion gap: 9 (ref 5–15)
BUN: 44 mg/dL — ABNORMAL HIGH (ref 8–23)
CO2: 23 mmol/L (ref 22–32)
Calcium: 8.2 mg/dL — ABNORMAL LOW (ref 8.9–10.3)
Chloride: 98 mmol/L (ref 98–111)
Creatinine, Ser: 6.65 mg/dL — ABNORMAL HIGH (ref 0.44–1.00)
GFR, Estimated: 6 mL/min — ABNORMAL LOW (ref >60)
Glucose, Bld: 136 mg/dL — ABNORMAL HIGH (ref 70–99)
Potassium: 3.5 mmol/L (ref 3.5–5.1)
Sodium: 130 mmol/L — ABNORMAL LOW (ref 135–145)
Total Bilirubin: 1 mg/dL (ref 0.0–1.2)
Total Protein: 5.8 g/dL — ABNORMAL LOW (ref 6.5–8.1)

## 2023-07-11 LAB — PREPARE RBC (CROSSMATCH)

## 2023-07-11 LAB — GLUCOSE, CAPILLARY: Glucose-Capillary: 87 mg/dL (ref 70–99)

## 2023-07-11 LAB — FOLATE: Folate: >40 ng/mL (ref >5.9)

## 2023-07-11 LAB — PHOSPHORUS: Phosphorus: 3.9 mg/dL (ref 2.5–4.6)

## 2023-07-11 LAB — MAGNESIUM: Magnesium: 1.5 mg/dL — ABNORMAL LOW (ref 1.7–2.4)

## 2023-07-11 LAB — VITAMIN B12: Vitamin B-12: 1144 pg/mL — ABNORMAL HIGH (ref 180–914)

## 2023-07-11 LAB — HEPATITIS B SURFACE ANTIGEN: Hepatitis B Surface Ag: NONREACTIVE

## 2023-07-11 MED ORDER — CYANOCOBALAMIN 1000 MCG/ML IJ SOLN: 1000.0000 ug | INTRAMUSCULAR | Status: DC

## 2023-07-11 MED ORDER — LEVOTHYROXINE SODIUM 50 MCG PO TABS
50.0000 ug | ORAL_TABLET | Freq: Every day | ORAL | Status: DC
  Administered 2023-07-12: 50 ug via ORAL
  Filled 2023-07-11: qty 1

## 2023-07-11 MED ORDER — FOLIC ACID 1 MG PO TABS
1.0000 mg | ORAL_TABLET | Freq: Every day | ORAL | Status: DC
  Administered 2023-07-11 – 2023-07-12 (×2): 1 mg via ORAL
  Filled 2023-07-11 (×2): qty 1

## 2023-07-11 MED ORDER — CINACALCET HCL 30 MG PO TABS: 30.0000 mg | ORAL_TABLET | ORAL | Status: DC

## 2023-07-11 MED ORDER — ROSUVASTATIN CALCIUM 10 MG PO TABS
10.0000 mg | ORAL_TABLET | Freq: Every day | ORAL | Status: DC
  Administered 2023-07-11 – 2023-07-12 (×2): 10 mg via ORAL
  Filled 2023-07-11 (×2): qty 1

## 2023-07-11 MED ORDER — ONDANSETRON HCL 4 MG/2ML IJ SOLN: 4.0000 mg | Freq: Four times a day (QID) | INTRAMUSCULAR | Status: DC | PRN

## 2023-07-11 MED ORDER — ALTEPLASE 2 MG IJ SOLR: 2.0000 mg | Freq: Once | INTRAMUSCULAR | Status: DC | PRN

## 2023-07-11 MED ORDER — SODIUM CHLORIDE 0.9% IV SOLUTION: Freq: Once | INTRAVENOUS | Status: DC

## 2023-07-11 MED ORDER — HEPARIN SODIUM (PORCINE) 1000 UNIT/ML IJ SOLN
INTRAMUSCULAR | Status: AC
  Filled 2023-07-11: qty 4

## 2023-07-11 MED ORDER — HEPARIN SODIUM (PORCINE) 1000 UNIT/ML DIALYSIS
1000.0000 [IU] | INTRAMUSCULAR | Status: DC | PRN
Start: 2023-07-11 — End: 2023-07-12
  Administered 2023-07-11: 3800 [IU]

## 2023-07-11 MED ORDER — ONDANSETRON HCL 4 MG PO TABS: 4.0000 mg | ORAL_TABLET | Freq: Four times a day (QID) | ORAL | Status: DC | PRN

## 2023-07-11 MED ORDER — HYDRALAZINE HCL 20 MG/ML IJ SOLN: 10.0000 mg | Freq: Four times a day (QID) | INTRAMUSCULAR | Status: DC | PRN

## 2023-07-11 MED ORDER — HYDRALAZINE HCL 25 MG PO TABS
25.0000 mg | ORAL_TABLET | Freq: Two times a day (BID) | ORAL | Status: DC
  Administered 2023-07-11 – 2023-07-12 (×3): 25 mg via ORAL
  Filled 2023-07-11 (×4): qty 1

## 2023-07-11 MED ORDER — CALCITRIOL 0.25 MCG PO CAPS: 0.5000 ug | ORAL_CAPSULE | ORAL | Status: DC

## 2023-07-11 MED ORDER — AMIODARONE HCL 200 MG PO TABS
100.0000 mg | ORAL_TABLET | Freq: Every day | ORAL | Status: DC
  Administered 2023-07-11 – 2023-07-12 (×2): 100 mg via ORAL
  Filled 2023-07-11 (×2): qty 1

## 2023-07-11 NOTE — Plan of Care (Signed)
  Problem: Acute Rehab OT Goals (only OT should resolve) Goal: Pt. Will Perform Grooming Flowsheets (Taken 07/11/2023 1038) Pt Will Perform Grooming:  with modified independence  standing Goal: Pt. Will Transfer To Toilet Flowsheets (Taken 07/11/2023 1038) Pt Will Transfer to Toilet:  with modified independence  ambulating Goal: Pt/Caregiver Will Perform Home Exercise Program Flowsheets (Taken 07/11/2023 1038) Pt/caregiver will Perform Home Exercise Program:  Increased ROM  Increased strength  Both right and left upper extremity  Independently  Latorya Bautch OT, MOT

## 2023-07-11 NOTE — Progress Notes (Signed)
 Patient seen and examined, admitted after midnight secondary to generalized weakness and anemia; no overt bleeding appreciated and negative FOBT seen. Patient hemodynamically stable and and one unit PRBC given while in ED. Please refer to H&P written by Dr. Adefeso for further info/details on admission.  Plan: -continue to follow Hgb trend -will follow nephrology rec's for HD management and further assistance regarding IV Iron  and Epogen  therapy. -if remains stable, anticipate discharge back home in am (07/11/22)  Eric Nunnery MD [phone]

## 2023-07-11 NOTE — Progress Notes (Signed)
  HEMODIALYSIS TREATMENT NOTE:  HD treatment performed in [facility] using [facility] TDC - exit site is unremarkable.  A/O x3. No labs yet today - pt requested draw in HD.  Pre-HD Hgb resulted at 5.9.  One unit pRBC transfused after.  Unable to correct a flowsheet documentation error, but pRBCs were transfused from 1645 to 1720.    No additional RBC units on hand per blood bank, but they will order 2 additional units.  Dr. Ricky was notified. Hemodynamically stable.  Woke from a nap confused, per her usual.  Oriented to self, knows me, but doesn't understand where she is or why she can't go home now.  3.5 hour treatment completed.  All blood was returned. Goal met: net UF 2L .  Post-HD:  07/11/23 2005  Vitals  Temp 97.9 F (36.6 C)  Temp Source Oral  BP (!) 168/52  MAP (mmHg) 89  BP Location Left Arm  BP Method Automatic  Patient Position (if appropriate) Lying  Pulse Rate (!) 53  Pulse Rate Source Monitor  ECG Heart Rate (!) 52  Resp 18  Oxygen  Therapy  SpO2 100 %  O2 Device Room Air  Hepatitis B Pre Treatment Patient Checks  Hepatitis B Surface Antigen Results Pending (labs drawn, awaiting results)  Date Hepatitis B Surface Antigen Drawn 07/11/23  Hep B Antibody Quant/Post still pending  Date Hep B Antibody Quant/Post Drawn 07/11/23  Patient's Immunity Status Pending (labs drawn, awaiting results)  Isolation Initiated Unknown Hepatitis status (T[place] 1 - chem disinfection)  Post Treatment  Dialyzer Clearance Heavily streaked  Hemodialysis Intake (mL) 318 mL  Liters Processed 74.2  Fluid Removed (mL) 2000 mL  Tolerated HD Treatment Yes  Post-Hemodialysis Comments Goal met  Hemodialysis Catheter Left Subclavian Double lumen Temporary (Non-Tunneled)  Placement Date: 06/09/22   Placed prior to admission: Yes  Orientation: Left  Access Location: Subclavian  Hemodialysis Catheter Type: Double lumen Temporary (Non-Tunneled)  Site Condition No complications  Blue Lumen Status Flushed;Heparin   locked;Dead end cap in place  Red Lumen Status Flushed;Heparin  locked;Dead end cap in place  Purple Lumen Status N/A  Catheter fill solution Heparin  1000 units/ml  Catheter fill volume (Arterial) 1.9 cc  Catheter fill volume (Venous) 1.9  Dressing Type Transparent;Tube stabilization device  Dressing Status Clean, Dry, Intact;Antimicrobial disc in place  Interventions New dressing  Drainage Description None  Dressing Change Due 07/18/23  Post treatment catheter status Capped and Clamped    Pt was collected from [facility] by night nurse Breck Gearing, RN   Jon Laos, RN AP [facility]

## 2023-07-11 NOTE — Consult Note (Signed)
 Shawna [name] Admit Date: 07/10/2023 07/11/2023 Shawna [name]  Requesting Physician:  Ricky MD  Reason for Consult:  ESRD Comanagement, Anemia  HPI:  25F ESRD MWF [facility] presented to the ED yesterday at the instruction of outpatient hemodialysis unit because of reported hemoglobin in the threes.  She has chronic anemia from recurrent GI bleeding and requires frequent transfusions.  In the ED her hemoglobin is 6.8 and 1 unit of PRBCs ordered.  Other PMH includes CAD with history of CABG, history of atrial fibrillation currently not on anticoagulation, chronic HFrEF.  This morning she has no complaints, she is eating breakfast.  She denies dyspnea, fatigue, chest pain.  Because of antibodies PRBCs not yet obtained but on order.  K of 3.6, bicarbonate of 22.  Vital signs are stable.  She did not receive outpatient dialysis yesterday.  Here K3.6, BUN 34  Previous HD Rx of 3.5 hours, left IJ TDC, MWF [facility]  ROS Balance of 12 systems is negative w/ exceptions as above  PMH  Past Medical History:  Diagnosis Date   Acute on chronic respiratory failure with hypoxia (HCC) 10/10/2016   Anxiety    Arthritis    AVM (arteriovenous malformation) of colon    CAD (coronary artery disease)    a. s/p CABG in 2013 b. DES to D1 in 10/2016. c. cath in 07/2018 showing patent grafts with occlusion of D1 at prior stent site and progression of PDA disease --> medical management recommended   Carotid artery disease (HCC)    a. 60-79% LICA, 03/2012    Chronic bronchitis (HCC)    Chronic HFrEF (heart failure with reduced ejection fraction) (HCC)    Colon cancer (HCC) 1992   Esophageal stricture    ESRD on hemodialysis (HCC)    ESRD due to HTN, started dialysis 2011 and gets HD at [facility] with Dr Edwardo on MWF schedule.  Access is LUA AVF as of Sept 2014.    GERD (gastroesophageal reflux disease)    High cholesterol 12/2011   History of blood transfusion 07/2011; 12/2011; 01/2012 X 2; 04/2012    History of gout    History of lower GI bleeding    Hypertension    Iron  deficiency anemia    Jugular vein occlusion, right (HCC)    Mitral regurgitation    a. Moderate by echo, 02/2012   Mitral valve disease    NSVT (nonsustained ventricular tachycardia) (HCC)    Ovarian cancer (HCC) 1992   PAF (paroxysmal atrial fibrillation) (HCC)    Pneumonia ~ 2009   PUD (peptic ulcer disease)    TIA (transient ischemic attack)    Tricuspid valve disease    PSH  Past Surgical History:  Procedure Laterality Date   A/V FISTULAGRAM Left 10/04/2022   Procedure: A/V Fistulagram;  Surgeon: Jama Cordella MATSU, MD;  Location: [facility] INVASIVE CV LAB;  Service: Cardiovascular;  Laterality: Left;   A/V SHUNTOGRAM Left 03/19/2019   Procedure: A/V SHUNTOGRAM;  Surgeon: Jama Cordella MATSU, MD;  Location: [facility] INVASIVE CV LAB;  Service: Cardiovascular;  Laterality: Left;   ABDOMINAL HYSTERECTOMY  1992   APPENDECTOMY  06/1990   AV FISTULA PLACEMENT  07/2009   left upper arm   AV FISTULA PLACEMENT Right 09/06/2016   Procedure: RIGHT FOREARM ARTERIOVENOUS (AV) GRAFT;  Surgeon: Carlin FORBES Haddock, MD;  Location: [facility] OR;  Service: Vascular;  Laterality: Right;   AV FISTULA PLACEMENT N/A 02/24/2017   Procedure: INSERTION OF ARTERIOVENOUS (AV) GORE-TEX GRAFT ARM (BRACHIAL  AXILLARY);  Surgeon: Jama Cordella MATSU, MD;  Location: [facility] ORS;  Service: Vascular;  Laterality: N/A;   AVGG REMOVAL Right 09/06/2016   Procedure: REMOVAL OF Right Arm ARTERIOVENOUS GORETEX GRAFT and Vein Patch angioplasty of brachial artery;  Surgeon: Lonni GORMAN Blade, MD;  Location: [facility] OR;  Service: Vascular;  Laterality: Right;   BIOPSY  09/26/2019   Procedure: BIOPSY;  Surgeon: Golda Claudis PENNER, MD;  [facility];  Service: Endoscopy;;   BIOPSY  11/01/2022   Procedure: BIOPSY;  Surgeon: Eartha Angelia Sieving, MD;  [facility];  Service: Gastroenterology;;   COLON RESECTION  1992   COLON SURGERY     COLONOSCOPY N/A  03/09/2019   Procedure: COLONOSCOPY;  Surgeon: Golda Claudis PENNER, MD;  [facility];  Service: Endoscopy;  Laterality: N/A;   COLONOSCOPY N/A 08/11/2022   Procedure: COLONOSCOPY;  Surgeon: Albertus Gordy HERO, MD;  Location: [facility] ENDOSCOPY;  Service: Gastroenterology;  Laterality: N/A;   COLONOSCOPY WITH PROPOFOL  N/A 06/08/2021   Procedure: COLONOSCOPY WITH PROPOFOL ;  Surgeon: Eartha Angelia Sieving, MD;  [facility];  Service: Gastroenterology;  Laterality: N/A;  9:05 /Patient is on dialysis Mon Wed Fri   CORONARY ANGIOPLASTY WITH STENT PLACEMENT  12/15/11   2   CORONARY ANGIOPLASTY WITH STENT PLACEMENT  y/2013   1; makes total of 3 (05/02/2012)   CORONARY ARTERY BYPASS GRAFT  06/13/2012   Procedure: CORONARY ARTERY BYPASS GRAFTING (CABG);  Surgeon: Dallas [name] Jude, MD;  Location: [facility] OR;  Service: Open Heart Surgery;  Laterality: N/A;  cabg x four;  using left internal mammary artery, and left leg greater saphenous vein harvested endoscopically   CORONARY STENT INTERVENTION N/A 10/13/2016   Procedure: Coronary Stent Intervention;  Surgeon: Debby DELENA Sor, MD;  [facility] LAB;  Service: Cardiovascular;  Laterality: N/A;   DIALYSIS/PERMA CATHETER REMOVAL N/A 04/18/2017   Procedure: DIALYSIS/PERMA CATHETER REMOVAL;  Surgeon: Jama Cordella MATSU, MD;  Location: [facility] INVASIVE CV LAB;  Service: Cardiovascular;  Laterality: N/A;   DILATION AND CURETTAGE OF UTERUS     ENTEROSCOPY N/A 06/08/2021   Procedure: PUSH ENTEROSCOPY;  Surgeon: Eartha Angelia Sieving, MD;  [facility];  Service: Gastroenterology;  Laterality: N/A;   ENTEROSCOPY N/A 11/01/2022   Procedure: ENTEROSCOPY;  Surgeon: Eartha Angelia Sieving, MD;  [facility];  Service: Gastroenterology;  Laterality: N/A;   ENTEROSCOPY N/A 12/01/2022   Procedure: ENTEROSCOPY;  Surgeon: Leigh Elspeth SQUIBB, MD;  Location: [facility] ENDOSCOPY;  Service: Gastroenterology;  Laterality: N/A;    ESOPHAGOGASTRODUODENOSCOPY  01/20/2012   Procedure: ESOPHAGOGASTRODUODENOSCOPY (EGD);  Surgeon: Gwendlyn ONEIDA Buddy, MD,FACG;  Location: [facility] ENDOSCOPY;  Service: Endoscopy;  Laterality: N/A;   ESOPHAGOGASTRODUODENOSCOPY N/A 03/26/2013   Procedure: ESOPHAGOGASTRODUODENOSCOPY (EGD);  Surgeon: Norleen LOISE Kiang, MD;  Location: [facility] ENDOSCOPY;  Service: Endoscopy;  Laterality: N/A;   ESOPHAGOGASTRODUODENOSCOPY N/A 04/30/2015   Procedure: ESOPHAGOGASTRODUODENOSCOPY (EGD);  Surgeon: Claudis PENNER Golda, MD;  [facility];  Service: Endoscopy;  Laterality: N/A;  1pm - moved to 10/20 @ 1:10   ESOPHAGOGASTRODUODENOSCOPY N/A 07/29/2016   Procedure: ESOPHAGOGASTRODUODENOSCOPY (EGD);  Surgeon: Elspeth Deward Leigh, MD;  Location: [facility] ENDOSCOPY;  Service: Gastroenterology;  Laterality: N/A;  enteroscopy   ESOPHAGOGASTRODUODENOSCOPY N/A 09/26/2019   Procedure: ESOPHAGOGASTRODUODENOSCOPY (EGD);  Surgeon: Golda Claudis PENNER, MD;  [facility];  Service: Endoscopy;  Laterality: N/A;  1250   ESOPHAGOGASTRODUODENOSCOPY N/A 08/11/2022   Procedure: ESOPHAGOGASTRODUODENOSCOPY (EGD);  Surgeon: Albertus Gordy HERO, MD;  [facility]: [facility] ENDOSCOPY;  Service: Gastroenterology;  Laterality: N/A;   ESOPHAGOGASTRODUODENOSCOPY (EGD) WITH PROPOFOL  N/A 02/05/2021   Procedure: ESOPHAGOGASTRODUODENOSCOPY (EGD) WITH PROPOFOL ;  Surgeon: Cindie Carlin POUR, DO;  [facility];  Service: Endoscopy;  Laterality: N/A;   ESOPHAGOGASTRODUODENOSCOPY (EGD) WITH PROPOFOL  N/A 08/27/2022   Procedure: ESOPHAGOGASTRODUODENOSCOPY (EGD) WITH PROPOFOL ;  Surgeon: Cindie Carlin POUR, DO;  [facility];  Service: Endoscopy;  Laterality: N/A;   GIVENS CAPSULE STUDY N/A 03/07/2019   Procedure: GIVENS CAPSULE STUDY;  Surgeon: Golda Claudis PENNER, MD;  [facility];  Service: Endoscopy;  Laterality: N/A;  7:30   GIVENS CAPSULE STUDY N/A 04/22/2021   Procedure: GIVENS CAPSULE STUDY;  Surgeon: Golda Claudis PENNER, MD;  [facility];   Service: Endoscopy;  Laterality: N/A;  7:30   GIVENS CAPSULE STUDY N/A 08/27/2022   Procedure: GIVENS CAPSULE STUDY;  Surgeon: Cindie Carlin POUR, DO;  [facility];  Service: Endoscopy;  Laterality: N/A;   HOT HEMOSTASIS N/A 08/11/2022   Procedure: HOT HEMOSTASIS (ARGON PLASMA COAGULATION/BICAP);  Surgeon: Albertus Gordy HERO, MD;  Location: [facility] ENDOSCOPY;  Service: Gastroenterology;  Laterality: N/A;   HOT HEMOSTASIS N/A 12/01/2022   Procedure: HOT HEMOSTASIS (ARGON PLASMA COAGULATION/BICAP);  Surgeon: Leigh Elspeth SQUIBB, MD;  Location: [facility] ENDOSCOPY;  Service: Gastroenterology;  Laterality: N/A;   INSERTION OF DIALYSIS CATHETER N/A 10/05/2020   Procedure: ABORTED TUNNELED DIALYSIS CATHETER PLACEMENT RIGHT INTERNAL JUGULAR VEIN ;  Surgeon: Kallie Manuelita BROCKS, MD;  Location: [facility]S;  Service: General;  Laterality: N/A;   INTRAOPERATIVE TRANSESOPHAGEAL ECHOCARDIOGRAM  06/13/2012   Procedure: INTRAOPERATIVE TRANSESOPHAGEAL ECHOCARDIOGRAM;  Surgeon: Dallas [name] Jude, MD;  Location: [facility] OR;  Service: Open Heart Surgery;  Laterality: N/A;   IR DIALY SHUNT INTRO NEEDLE/INTRACATH INITIAL W/IMG LEFT Left 10/06/2020   IR FLUORO GUIDE CV LINE RIGHT  06/17/2020   IR FLUORO GUIDE CV LINE RIGHT  11/12/2022   IR GENERIC HISTORICAL  07/26/2016   IR FLUORO GUIDE CV LINE RIGHT 07/26/2016 Ozell Specking, MD [facility]   IR GENERIC HISTORICAL  07/26/2016   IR US  GUIDE VASC ACCESS RIGHT 07/26/2016 Ozell Specking, MD [facility]   IR GENERIC HISTORICAL  08/02/2016   IR US  GUIDE VASC ACCESS RIGHT 08/02/2016 Ozell Specking, MD [facility]   IR GENERIC HISTORICAL  08/02/2016   IR FLUORO GUIDE CV LINE RIGHT 08/02/2016 Ozell Specking, MD [facility]   IR RADIOLOGY PERIPHERAL GUIDED IV START  03/28/2017   IR REMOVAL TUN CV CATH W/O FL  08/11/2020   IR REMOVAL TUN CV CATH W/O FL  11/15/2022   IR THROMBECTOMY AV FISTULA W/THROMBOLYSIS INC/SHUNT/IMG LEFT Left 06/17/2020   IR US  GUIDE VASC ACCESS LEFT  06/17/2020   IR US  GUIDE VASC  ACCESS RIGHT  03/28/2017   IR US  GUIDE VASC ACCESS RIGHT  06/17/2020   IR US  GUIDE VASC ACCESS RIGHT  11/12/2022   LEFT HEART CATH AND CORONARY ANGIOGRAPHY N/A 09/20/2016   Procedure: Left Heart Cath and Coronary Angiography;  Surgeon: Victory LELON Sharps, MD;  Location: [facility] INVASIVE CV LAB;  Service: Cardiovascular;  Laterality: N/A;   LEFT HEART CATH AND CORS/GRAFTS ANGIOGRAPHY N/A 10/13/2016   Procedure: Left Heart Cath and Cors/Grafts Angiography;  Surgeon: Debby DELENA Sor, MD;  [facility]: [facility];  Service: Cardiovascular;  Laterality: N/A;   LEFT HEART CATH AND CORS/GRAFTS ANGIOGRAPHY N/A 07/13/2018   Procedure: LEFT HEART CATH AND CORS/GRAFTS ANGIOGRAPHY;  Surgeon: Jordan, Peter M, MD;  Location: [facility] INVASIVE CV LAB;  Service: Cardiovascular;  Laterality: N/A;   LEFT HEART CATH AND CORS/GRAFTS ANGIOGRAPHY N/A 07/22/2021   Procedure: LEFT HEART CATH AND CORS/GRAFTS ANGIOGRAPHY;  Surgeon: Court Dorn PARAS, MD;  Location: [facility] CV LAB;  Service: Cardiovascular;  Laterality: N/A;   LEFT HEART CATHETERIZATION WITH CORONARY ANGIOGRAM N/A 12/15/2011   Procedure: LEFT HEART CATHETERIZATION WITH CORONARY ANGIOGRAM;  Surgeon: Lonni JONETTA Cash, MD;  Location: [facility] CATH LAB;  Service: Cardiovascular;  Laterality: N/A;   LEFT HEART CATHETERIZATION WITH CORONARY ANGIOGRAM N/A 01/10/2012   Procedure: LEFT HEART CATHETERIZATION WITH CORONARY ANGIOGRAM;  Surgeon: Peter M Jordan, MD;  Location: [facility] CATH LAB;  Service: Cardiovascular;  Laterality: N/A;   LEFT HEART CATHETERIZATION WITH CORONARY ANGIOGRAM N/A 06/08/2012   Procedure: LEFT HEART CATHETERIZATION WITH CORONARY ANGIOGRAM;  Surgeon: Lonni JONETTA Cash, MD;  Location: [facility] CATH LAB;  Service: Cardiovascular;  Laterality: N/A;   LEFT HEART CATHETERIZATION WITH CORONARY/GRAFT ANGIOGRAM N/A 12/10/2013   Procedure: LEFT HEART CATHETERIZATION WITH EL BILE;  Surgeon: Candyce GORMAN Reek, MD;  Location: [facility] CATH LAB;  Service: Cardiovascular;   Laterality: N/A;   OVARY SURGERY     ovarian cancer   POLYPECTOMY  03/09/2019   Procedure: POLYPECTOMY;  Surgeon: Golda Claudis PENNER, MD;  [facility];  Service: Endoscopy;;  cecal    POLYPECTOMY N/A 09/26/2019   Procedure: DUODENAL POLYPECTOMY;  Surgeon: Golda Claudis PENNER, MD;  [facility];  Service: Endoscopy;  Laterality: N/A;   POLYPECTOMY  08/11/2022   Procedure: POLYPECTOMY;  Surgeon: Albertus Gordy HERO, MD;  Location: [facility] ENDOSCOPY;  Service: Gastroenterology;;   REVISION OF ARTERIOVENOUS GORETEX GRAFT N/A 02/24/2017   Procedure: REVISION OF ARTERIOVENOUS GORETEX GRAFT (RESECTION);  Surgeon: Jama Cordella MATSU, MD;  Location: [facility] ORS;  Service: Vascular;  Laterality: N/A;   REVISON OF ARTERIOVENOUS FISTULA Left 06/19/2020   Procedure: REVISION OF LEFT UPPER ARM AV GRAFT WITH INTERPOSITION JUMP GRAFT USING GORE LIMB;  Surgeon: Gretta Lonni PARAS, MD;  Location: [facility] OR;  Service: Vascular;  Laterality: Left;   SHUNTOGRAM N/A 10/15/2013   Procedure: Fistulogram;  Surgeon: Gaile LELON New, MD;  Location: [facility] CATH LAB;  Service: Cardiovascular;  Laterality: N/A;   SUBMUCOSAL TATTOO INJECTION  11/01/2022   Procedure: SUBMUCOSAL TATTOO INJECTION;  Surgeon: Eartha Angelia Sieving, MD;  [facility];  Service: Gastroenterology;;   THROMBECTOMY / ARTERIOVENOUS GRAFT REVISION  2011   left upper arm   TUBAL LIGATION  1980's   UPPER EXTREMITY ANGIOGRAPHY Bilateral 12/06/2016   Procedure: Upper Extremity Angiography;  Surgeon: Jama Cordella MATSU, MD;  Location: [facility] INVASIVE CV LAB;  Service: Cardiovascular;  Laterality: Bilateral;   UPPER EXTREMITY INTERVENTION Left 06/06/2017   Procedure: UPPER EXTREMITY INTERVENTION;  Surgeon: Jama Cordella MATSU, MD;  Location: [facility] INVASIVE CV LAB;  Service: Cardiovascular;  Laterality: Left;   UPPER EXTREMITY VENOGRAPHY Left 10/04/2022   Procedure: UPPER EXTREMITY VENOGRAPHY;  Surgeon: Jama Cordella MATSU, MD;  Location: [facility] INVASIVE  CV LAB;  Service: Cardiovascular;  Laterality: Left;   FH  Family History  Problem Relation Age of Onset   Heart disease Mother        Heart Disease before age 65   Hyperlipidemia Mother    Hypertension Mother    Diabetes Mother    Heart attack Mother    Heart disease Father        Heart Disease before age 27   Hyperlipidemia Father    Hypertension Father    Diabetes Father    Diabetes Sister  Hypertension Sister    Diabetes Brother    Hyperlipidemia Brother    Heart attack Brother    Hypertension Sister    Heart attack Brother    Colon cancer Child 65   Other Other        noncontributory for early CAD   Esophageal cancer Neg Hx    Liver disease Neg Hx    Kidney disease Neg Hx    Colon polyps Neg Hx    SH  reports that she has never smoked. She has never used smokeless tobacco. She reports that she does not drink alcohol  and does not use drugs. Allergies  Allergies  Allergen Reactions   Amlodipine  Swelling   Aspirin  Other (See Comments)    High Doses Mess up her stomach; makes my bowels have blood in them. Takes 81 mg EC Aspirin     Nitrofurantoin Hives   Ranexa  [Ranolazine ] Other (See Comments)    Myoclonus-hospitalized    Bactrim [Sulfamethoxazole-Trimethoprim] Rash   Iodinated Contrast Media Itching and Other (See Comments)   Iron  Itching and Other (See Comments)    they gave me iron  in dialysis; had to give me Benadryl  cause I had to have the iron  (05/02/2012)   Tylenol  [Acetaminophen ] Itching and Other (See Comments)    Makes her feet on fire per pt - can tolerate per pt   Gabapentin  Other (See Comments)    Unknown reaction   Sucroferric Oxyhydroxide Other (See Comments)    Unknown   Levaquin  [Levofloxacin  In D5w] Rash   Levofloxacin  Rash and Other (See Comments)   Plavix  [Clopidogrel  Bisulfate] Rash   Protonix  [Pantoprazole  Sodium] Rash   Venofer [Ferric Oxide] Itching and Other (See Comments)    Patient reports using Benadryl  prior to doses as [facility]   Home medications Prior to Admission medications   Medication Sig Start Date End Date Taking? Authorizing Provider  amiodarone  (PACERONE ) 200 MG tablet Take 200 mg by mouth daily. 05/31/23   [provider]  calcitRIOL  (ROCALTROL ) 0.25 MCG capsule Take 7 capsules (1.75 mcg total) by mouth every Monday, Wednesday, and Friday with hemodialysis. 12/21/22   Christobal Guadalajara, MD  cinacalcet  (SENSIPAR ) 30 MG tablet Take 1 tablet (30 mg total) by mouth every Monday, Wednesday, and Friday with hemodialysis. 12/19/22   Christobal Guadalajara, MD  cyanocobalamin  (VITAMIN B12) 1000 MCG/ML injection Inject 1,000 mcg into the muscle every 30 (thirty) days.    [provider]  cyclobenzaprine  (FLEXERIL ) 5 MG tablet Take 5 mg by mouth 3 (three) times daily as needed for muscle spasms.    [provider]  folic acid  (FOLVITE ) 1 MG tablet Take 1 mg by mouth daily. 05/24/22   [provider]  gabapentin  (NEURONTIN ) 100 MG capsule Take 1 capsule (100 mg total) by mouth every evening. 06/05/23 07/05/23  Raenelle Coria, MD  hydrALAZINE  (APRESOLINE ) 25 MG tablet Take 1 tablet (25 mg total) by mouth 2 (two) times daily. Hold for sbp <110 12/27/22   Christobal Guadalajara, MD  hydrOXYzine  (ATARAX ) 25 MG tablet Take 25 mg by mouth every 6 (six) hours as needed for itching. 06/06/22   [provider]  levETIRAcetam  (KEPPRA ) 500 MG tablet Take 1 tablet (500 mg total) by mouth daily. 11/15/22   Dennise Lavada POUR, MD  levothyroxine  (SYNTHROID ) 75 MCG tablet Take 1 tablet (75 mcg total) by mouth daily at 6 (six) AM. 02/02/23   Maree, Pratik D, DO  metoprolol  tartrate (LOPRESSOR ) 25 MG tablet Take 0.5 tablets (12.5 mg total)  by mouth 2 (two) times daily. 02/01/23 06/01/23  Maree, Pratik D, DO  multivitamin (RENA-VIT) TABS tablet Take 1 tablet by mouth daily.    [provider]  nitroGLYCERIN  (NITROSTAT ) 0.4 MG SL tablet Place 0.4 mg under the tongue every 5 (five) minutes as needed for chest pain.     [provider]  octreotide  (SANDOSTATIN  LAR) 20 MG injection Inject 20 mg into the muscle every 28 (twenty-eight) days. 11/15/22   Singh, Prashant K, MD  olmesartan (BENICAR) 20 MG tablet Take 20 mg by mouth daily. 01/14/23   [provider]  omeprazole  (PRILOSEC) 40 MG capsule Take 40 mg by mouth daily. 05/31/23   [provider]  ondansetron  (ZOFRAN -ODT) 4 MG disintegrating tablet Take 4 mg by mouth every 8 (eight) hours as needed for nausea or vomiting. 05/22/23   [provider]  rosuvastatin  (CRESTOR ) 10 MG tablet Take 1 tablet by mouth once daily 06/06/22   Strader, Brittany M, PA-C    Current Medications Scheduled Meds:  sodium chloride    Intravenous Once   amiodarone   100 mg Oral Daily   [START ON 07/12/2023] calcitRIOL   0.5 mcg Oral Q M,W,F-HD   Chlorhexidine  Gluconate Cloth  6 each Topical Q0600   [START ON 07/12/2023] cinacalcet   30 mg Oral Q M,W,F-HD   cyanocobalamin   1,000 mcg Intramuscular Q30 days   folic acid   1 mg Oral Daily   hydrALAZINE   25 mg Oral BID   [START ON 07/12/2023] levothyroxine   50 mcg Oral Q0600   rosuvastatin   10 mg Oral Daily   Continuous Infusions: PRN Meds:.hydrALAZINE , ondansetron  **OR** ondansetron  (ZOFRAN ) IV  CBC Recent Labs  Lab 07/10/23 1514  WBC 4.2  NEUTROABS 2.9  HGB 6.8*  HCT 21.0*  MCV 116.7*  PLT 202   Basic Metabolic Panel Recent Labs  Lab 07/10/23 1514  NA 133*  K 3.6  CL 99  CO2 22  GLUCOSE 107*  BUN 34*  CREATININE 5.84*  CALCIUM  8.9    Physical Exam  Blood pressure (!) 165/64, pulse (!) 44, temperature (!) 97.5 F (36.4 C), temperature source Oral, resp. rate 18, height 5' 2 (1.575 m), weight 57.2 kg, SpO2 100%. GEN: NAD, sitting in chair eating breakfast, pleasant and interactive ENT: NCAT EYES: EOMI CV: Bradycardic, regular PULM: Clear bilaterally, normal work of breathing ABD: Soft, nontender SKIN: No rashes or lesions EXT:1+ LEE b/l.  Assessment 42F ESRD MWF here with  recurrent anemia and missed HD.  ESRD MWF [facility] TDC at [facility] Recurrent anemia history of recurrent GI bleeds, awaiting PRBCs; ESA as outpatient HTN/Vol: sees stable, try for 2 L UF today CKD-BMD: Continue outpatient regimen  Plan HD as above  Shawna [name]  07/11/2023, 10:01 AM

## 2023-07-11 NOTE — Progress Notes (Signed)
Patient requesting labs to be drawn when patient has dialysis. MD Thomes Dinning made aware.

## 2023-07-11 NOTE — Progress Notes (Signed)
 Mobility Specialist Progress Note:    07/11/23 1125  Mobility  Activity Ambulated with assistance in room;Ambulated with assistance to bathroom  Level of Assistance Contact guard assist, steadying assist (1 HHA)  Assistive Device None  Distance Ambulated (ft) 15 ft  Range of Motion/Exercises Active;All extremities  Activity Response Tolerated well  Mobility Referral Yes  Mobility visit 1 Mobility  Mobility Specialist Start Time (ACUTE ONLY) 1112  Mobility Specialist Stop Time (ACUTE ONLY) 1124  Mobility Specialist Time Calculation (min) (ACUTE ONLY) 12 min   Pt received requesting assistance to bed. Required CGA with HHA to ambulate with no AD. Tolerated well, asx throughout. Left pt supine, bed alarm on. All needs met.   Sherrilee Ditty Mobility Specialist Please contact via Special Educational Needs Teacher or  Rehab office at [phone]

## 2023-07-11 NOTE — Plan of Care (Signed)
  Problem: Education: Goal: Knowledge of General Education information will improve Description: Including pain rating scale, medication(s)/side effects and non-pharmacologic comfort measures Outcome: Progressing   Problem: Clinical Measurements: Goal: Diagnostic test results will improve Outcome: Progressing   Problem: Nutrition: Goal: Adequate nutrition will be maintained Outcome: Progressing   Problem: Skin Integrity: Goal: Risk for impaired skin integrity will decrease Outcome: Progressing   

## 2023-07-11 NOTE — H&P (Addendum)
 History and Physical    Patient: Shawna [name] FMW:[id number] DOB: August 08, 1940 DOA: 07/10/2023 DOS: the patient was seen and examined on 07/11/2023 PCP: [facility]  Patient coming from: Home  Chief Complaint:  Chief Complaint  Patient presents with   Abnormal labs   HPI: Shawna [name] is a 82 y.o. female with medical history significant for ESRD on hemodialysis (MWF), recurrent GI bleeds possibly due to AVMs, paroxysmal atrial fibrillation no longer on anticoagulation, CAD status post CABG and PCI who presents to the emergency department.  Patient states that she was called today by outpatient center that her hemoglobin was in the 3's and patient was unable to undergo dialysis, she was asked to go to the ED for further evaluation and management.  Patient's hemoglobin about 4 weeks ago was 8.1, she has been complaining of about 1 month of increased generalized weakness.  She denies any known blood loss or black stools.  She was last dialyzed about 3 days ago.  ED Course:  In the emergency department, BP was 164/54, pulse 44 bpm.  Workup in the ED showed CBC 4.2, hemoglobin/hematocrit 6.8/21 (this was 8.7/26.1 on 06/05/2023), MCV 116.7, platelets 202.  BMP was normal except for sodium of 133 and blood glucose of 107 and BUN 34, creatinine 5.84.  FOBT was negative.  Type and screen was done and 1 unit of blood was ordered to be transfused in the ED.  Review of Systems: Review of systems as noted in the HPI. All other systems reviewed and are negative.   Past Medical History:  Diagnosis Date   Acute on chronic respiratory failure with hypoxia (HCC) 10/10/2016   Anxiety    Arthritis    AVM (arteriovenous malformation) of colon    CAD (coronary artery disease)    a. s/p CABG in 2013 b. DES to D1 in 10/2016. c. cath in 07/2018 showing patent grafts with occlusion of D1 at prior stent site and progression of PDA disease --> medical management recommended   Carotid artery disease  (HCC)    a. 60-79% LICA, 03/2012    Chronic bronchitis (HCC)    Chronic HFrEF (heart failure with reduced ejection fraction) (HCC)    Colon cancer (HCC) 1992   Esophageal stricture    ESRD on hemodialysis (HCC)    ESRD due to HTN, started dialysis 2011 and gets HD at [facility] with Dr Edwardo on MWF schedule.  Access is LUA AVF as of Sept 2014.    GERD (gastroesophageal reflux disease)    High cholesterol 12/2011   History of blood transfusion 07/2011; 12/2011; 01/2012 X 2; 04/2012   History of gout    History of lower GI bleeding    Hypertension    Iron  deficiency anemia    Jugular vein occlusion, right (HCC)    Mitral regurgitation    a. Moderate by echo, 02/2012   Mitral valve disease    NSVT (nonsustained ventricular tachycardia) (HCC)    Ovarian cancer (HCC) 1992   PAF (paroxysmal atrial fibrillation) (HCC)    Pneumonia ~ 2009   PUD (peptic ulcer disease)    TIA (transient ischemic attack)    Tricuspid valve disease    Past Surgical History:  Procedure Laterality Date   A/V FISTULAGRAM Left 10/04/2022   Procedure: A/V Fistulagram;  Surgeon: Jama Cordella MATSU, MD;  Location: [facility] INVASIVE CV LAB;  Service: Cardiovascular;  Laterality: Left;   A/V SHUNTOGRAM Left 03/19/2019   Procedure: A/V SHUNTOGRAM;  Surgeon: Jama Cordella  G, MD;  Location: [facility] INVASIVE CV LAB;  Service: Cardiovascular;  Laterality: Left;   ABDOMINAL HYSTERECTOMY  1992   APPENDECTOMY  06/1990   AV FISTULA PLACEMENT  07/2009   left upper arm   AV FISTULA PLACEMENT Right 09/06/2016   Procedure: RIGHT FOREARM ARTERIOVENOUS (AV) GRAFT;  Surgeon: Carlin FORBES Haddock, MD;  Location: [facility] OR;  Service: Vascular;  Laterality: Right;   AV FISTULA PLACEMENT N/A 02/24/2017   Procedure: INSERTION OF ARTERIOVENOUS (AV) GORE-TEX GRAFT ARM (BRACHIAL AXILLARY);  Surgeon: Jama Cordella MATSU, MD;  Location: [facility] ORS;  Service: Vascular;  Laterality: N/A;   AVGG REMOVAL Right 09/06/2016   Procedure: REMOVAL OF Right Arm  ARTERIOVENOUS GORETEX GRAFT and Vein Patch angioplasty of brachial artery;  Surgeon: Lonni GORMAN Blade, MD;  Location: [facility] OR;  Service: Vascular;  Laterality: Right;   BIOPSY  09/26/2019   Procedure: BIOPSY;  Surgeon: Golda Claudis PENNER, MD;  [facility];  Service: Endoscopy;;   BIOPSY  11/01/2022   Procedure: BIOPSY;  Surgeon: Eartha Angelia Sieving, MD;  [facility];  Service: Gastroenterology;;   COLON RESECTION  1992   COLON SURGERY     COLONOSCOPY N/A 03/09/2019   Procedure: COLONOSCOPY;  Surgeon: Golda Claudis PENNER, MD;  [facility];  Service: Endoscopy;  Laterality: N/A;   COLONOSCOPY N/A 08/11/2022   Procedure: COLONOSCOPY;  Surgeon: Albertus Gordy HERO, MD;  Location: [facility] ENDOSCOPY;  Service: Gastroenterology;  Laterality: N/A;   COLONOSCOPY WITH PROPOFOL  N/A 06/08/2021   Procedure: COLONOSCOPY WITH PROPOFOL ;  Surgeon: Eartha Angelia Sieving, MD;  [facility];  Service: Gastroenterology;  Laterality: N/A;  9:05 /Patient is on dialysis Mon Wed Fri   CORONARY ANGIOPLASTY WITH STENT PLACEMENT  12/15/11   2   CORONARY ANGIOPLASTY WITH STENT PLACEMENT  y/2013   1; makes total of 3 (05/02/2012)   CORONARY ARTERY BYPASS GRAFT  06/13/2012   Procedure: CORONARY ARTERY BYPASS GRAFTING (CABG);  Surgeon: Dallas KATHEE Jude, MD;  Location: [facility] OR;  Service: Open Heart Surgery;  Laterality: N/A;  cabg x four;  using left internal mammary artery, and left leg greater saphenous vein harvested endoscopically   CORONARY STENT INTERVENTION N/A 10/13/2016   Procedure: Coronary Stent Intervention;  Surgeon: Debby DELENA Sor, MD;  [facility] LAB;  Service: Cardiovascular;  Laterality: N/A;   DIALYSIS/PERMA CATHETER REMOVAL N/A 04/18/2017   Procedure: DIALYSIS/PERMA CATHETER REMOVAL;  Surgeon: Jama Cordella MATSU, MD;  Location: [facility] INVASIVE CV LAB;  Service: Cardiovascular;  Laterality: N/A;   DILATION AND CURETTAGE OF UTERUS     ENTEROSCOPY N/A 06/08/2021    Procedure: PUSH ENTEROSCOPY;  Surgeon: Eartha Angelia Sieving, MD;  [facility];  Service: Gastroenterology;  Laterality: N/A;   ENTEROSCOPY N/A 11/01/2022   Procedure: ENTEROSCOPY;  Surgeon: Eartha Angelia Sieving, MD;  [facility];  Service: Gastroenterology;  Laterality: N/A;   ENTEROSCOPY N/A 12/01/2022   Procedure: ENTEROSCOPY;  Surgeon: Leigh Elspeth SQUIBB, MD;  Location: [facility] ENDOSCOPY;  Service: Gastroenterology;  Laterality: N/A;   ESOPHAGOGASTRODUODENOSCOPY  01/20/2012   Procedure: ESOPHAGOGASTRODUODENOSCOPY (EGD);  Surgeon: Gwendlyn ONEIDA Buddy, MD,FACG;  Location: [facility] ENDOSCOPY;  Service: Endoscopy;  Laterality: N/A;   ESOPHAGOGASTRODUODENOSCOPY N/A 03/26/2013   Procedure: ESOPHAGOGASTRODUODENOSCOPY (EGD);  Surgeon: Norleen LOISE Kiang, MD;  Location: [facility] ENDOSCOPY;  Service: Endoscopy;  Laterality: N/A;   ESOPHAGOGASTRODUODENOSCOPY N/A 04/30/2015   Procedure: ESOPHAGOGASTRODUODENOSCOPY (EGD);  Surgeon: Claudis PENNER Golda, MD;  [facility];  Service: Endoscopy;  Laterality:  N/A;  1pm - moved to 10/20 @ 1:10   ESOPHAGOGASTRODUODENOSCOPY N/A 07/29/2016   Procedure: ESOPHAGOGASTRODUODENOSCOPY (EGD);  Surgeon: Elspeth Deward Naval, MD;  Location: [facility] ENDOSCOPY;  Service: Gastroenterology;  Laterality: N/A;  enteroscopy   ESOPHAGOGASTRODUODENOSCOPY N/A 09/26/2019   Procedure: ESOPHAGOGASTRODUODENOSCOPY (EGD);  Surgeon: Golda Claudis PENNER, MD;  [facility];  Service: Endoscopy;  Laterality: N/A;  1250   ESOPHAGOGASTRODUODENOSCOPY N/A 08/11/2022   Procedure: ESOPHAGOGASTRODUODENOSCOPY (EGD);  Surgeon: Albertus Gordy HERO, MD;  Location: [facility] ENDOSCOPY;  Service: Gastroenterology;  Laterality: N/A;   ESOPHAGOGASTRODUODENOSCOPY (EGD) WITH PROPOFOL  N/A 02/05/2021   Procedure: ESOPHAGOGASTRODUODENOSCOPY (EGD) WITH PROPOFOL ;  Surgeon: Cindie Carlin POUR, DO;  [facility];  Service: Endoscopy;  Laterality: N/A;   ESOPHAGOGASTRODUODENOSCOPY (EGD) WITH PROPOFOL  N/A  08/27/2022   Procedure: ESOPHAGOGASTRODUODENOSCOPY (EGD) WITH PROPOFOL ;  Surgeon: Cindie Carlin POUR, DO;  [facility];  Service: Endoscopy;  Laterality: N/A;   GIVENS CAPSULE STUDY N/A 03/07/2019   Procedure: GIVENS CAPSULE STUDY;  Surgeon: Golda Claudis PENNER, MD;  [facility];  Service: Endoscopy;  Laterality: N/A;  7:30   GIVENS CAPSULE STUDY N/A 04/22/2021   Procedure: GIVENS CAPSULE STUDY;  Surgeon: Golda Claudis PENNER, MD;  [facility];  Service: Endoscopy;  Laterality: N/A;  7:30   GIVENS CAPSULE STUDY N/A 08/27/2022   Procedure: GIVENS CAPSULE STUDY;  Surgeon: Cindie Carlin POUR, DO;  [facility];  Service: Endoscopy;  Laterality: N/A;   HOT HEMOSTASIS N/A 08/11/2022   Procedure: HOT HEMOSTASIS (ARGON PLASMA COAGULATION/BICAP);  Surgeon: Albertus Gordy HERO, MD;  Location: [facility] ENDOSCOPY;  Service: Gastroenterology;  Laterality: N/A;   HOT HEMOSTASIS N/A 12/01/2022   Procedure: HOT HEMOSTASIS (ARGON PLASMA COAGULATION/BICAP);  Surgeon: Naval Elspeth SQUIBB, MD;  Location: [facility] ENDOSCOPY;  Service: Gastroenterology;  Laterality: N/A;   INSERTION OF DIALYSIS CATHETER N/A 10/05/2020   Procedure: ABORTED TUNNELED DIALYSIS CATHETER PLACEMENT RIGHT INTERNAL JUGULAR VEIN ;  Surgeon: Kallie Manuelita BROCKS, MD;  Location: [facility]S;  Service: General;  Laterality: N/A;   INTRAOPERATIVE TRANSESOPHAGEAL ECHOCARDIOGRAM  06/13/2012   Procedure: INTRAOPERATIVE TRANSESOPHAGEAL ECHOCARDIOGRAM;  Surgeon: Dallas KATHEE Jude, MD;  Location: [facility] OR;  Service: Open Heart Surgery;  Laterality: N/A;   IR DIALY SHUNT INTRO NEEDLE/INTRACATH INITIAL W/IMG LEFT Left 10/06/2020   IR FLUORO GUIDE CV LINE RIGHT  06/17/2020   IR FLUORO GUIDE CV LINE RIGHT  11/12/2022   IR GENERIC HISTORICAL  07/26/2016   IR FLUORO GUIDE CV LINE RIGHT 07/26/2016 Ozell Specking, MD [facility]   IR GENERIC HISTORICAL  07/26/2016   IR US  GUIDE VASC ACCESS RIGHT 07/26/2016 Ozell Specking, MD [facility]   IR GENERIC HISTORICAL   08/02/2016   IR US  GUIDE VASC ACCESS RIGHT 08/02/2016 Ozell Specking, MD [facility]   IR GENERIC HISTORICAL  08/02/2016   IR FLUORO GUIDE CV LINE RIGHT 08/02/2016 Ozell Specking, MD [facility]   IR RADIOLOGY PERIPHERAL GUIDED IV START  03/28/2017   IR REMOVAL TUN CV CATH W/O FL  08/11/2020   IR REMOVAL TUN CV CATH W/O FL  11/15/2022   IR THROMBECTOMY AV FISTULA W/THROMBOLYSIS INC/SHUNT/IMG LEFT Left 06/17/2020   IR US  GUIDE VASC ACCESS LEFT  06/17/2020   IR US  GUIDE VASC ACCESS RIGHT  03/28/2017   IR US  GUIDE VASC ACCESS RIGHT  06/17/2020   IR US  GUIDE VASC ACCESS RIGHT  11/12/2022   LEFT HEART CATH AND CORONARY ANGIOGRAPHY N/A 09/20/2016   Procedure: Left Heart Cath and Coronary Angiography;  Surgeon:  Victory LELON Sharps, MD;  Location: [facility] INVASIVE CV LAB;  Service: Cardiovascular;  Laterality: N/A;   LEFT HEART CATH AND CORS/GRAFTS ANGIOGRAPHY N/A 10/13/2016   Procedure: Left Heart Cath and Cors/Grafts Angiography;  Surgeon: Debby DELENA Sor, MD;  [facility]: [facility] CV LAB;  Service: Cardiovascular;  Laterality: N/A;   LEFT HEART CATH AND CORS/GRAFTS ANGIOGRAPHY N/A 07/13/2018   Procedure: LEFT HEART CATH AND CORS/GRAFTS ANGIOGRAPHY;  Surgeon: Jordan, Peter M, MD;  Location: [facility] INVASIVE CV LAB;  Service: Cardiovascular;  Laterality: N/A;   LEFT HEART CATH AND CORS/GRAFTS ANGIOGRAPHY N/A 07/22/2021   Procedure: LEFT HEART CATH AND CORS/GRAFTS ANGIOGRAPHY;  Surgeon: Court Dorn PARAS, MD;  Location: [facility] INVAS[facility] CV LAB;  Service: Cardiovascular;  Laterality: N/A;   LEFT HEART CATHETERIZATION WITH CORONARY ANGIOGRAM N/A 12/15/2011   Procedure: LEFT HEART CATHETERIZATION WITH CORONARY ANGIOGRAM;  Surgeon: Lonni JONETTA Cash, MD;  Location: [facility] CATH LAB;  Service: Cardiovascular;  Laterality: N/A;   LEFT HEART CATHETERIZATION WITH CORONARY ANGIOGRAM N/A 01/10/2012   Procedure: LEFT HEART CATHETERIZATION WITH CORONARY ANGIOGRAM;  Surgeon: Peter M Jordan, MD;  Location: [facility] CATH LAB;  Service: Cardiovascular;  Laterality:  N/A;   LEFT HEART CATHETERIZATION WITH CORONARY ANGIOGRAM N/A 06/08/2012   Procedure: LEFT HEART CATHETERIZATION WITH CORONARY ANGIOGRAM;  Surgeon: Lonni JONETTA Cash, MD;  Location: [facility] CATH LAB;  Service: Cardiovascular;  Laterality: N/A;   LEFT HEART CATHETERIZATION WITH CORONARY/GRAFT ANGIOGRAM N/A 12/10/2013   Procedure: LEFT HEART CATHETERIZATION WITH EL BILE;  Surgeon: Candyce GORMAN Reek, MD;  Location: [facility] CATH LAB;  Service: Cardiovascular;  Laterality: N/A;   OVARY SURGERY     ovarian cancer   POLYPECTOMY  03/09/2019   Procedure: POLYPECTOMY;  Surgeon: Golda Claudis PENNER, MD;  [facility];  Service: Endoscopy;;  cecal    POLYPECTOMY N/A 09/26/2019   Procedure: DUODENAL POLYPECTOMY;  Surgeon: Golda Claudis PENNER, MD;  [facility];  Service: Endoscopy;  Laterality: N/A;   POLYPECTOMY  08/11/2022   Procedure: POLYPECTOMY;  Surgeon: Albertus Gordy HERO, MD;  Location: [facility] ENDOSCOPY;  Service: Gastroenterology;;   REVISION OF ARTERIOVENOUS GORETEX GRAFT N/A 02/24/2017   Procedure: REVISION OF ARTERIOVENOUS GORETEX GRAFT (RESECTION);  Surgeon: Jama Cordella MATSU, MD;  Location: [facility] ORS;  Service: Vascular;  Laterality: N/A;   REVISON OF ARTERIOVENOUS FISTULA Left 06/19/2020   Procedure: REVISION OF LEFT UPPER ARM AV GRAFT WITH INTERPOSITION JUMP GRAFT USING GORE LIMB;  Surgeon: Gretta Lonni PARAS, MD;  Location: [facility] OR;  Service: Vascular;  Laterality: Left;   SHUNTOGRAM N/A 10/15/2013   Procedure: Fistulogram;  Surgeon: Gaile LELON New, MD;  Location: [facility] CATH LAB;  Service: Cardiovascular;  Laterality: N/A;   SUBMUCOSAL TATTOO INJECTION  11/01/2022   Procedure: SUBMUCOSAL TATTOO INJECTION;  Surgeon: Eartha Angelia Sieving, MD;  [facility];  Service: Gastroenterology;;   THROMBECTOMY / ARTERIOVENOUS GRAFT REVISION  2011   left upper arm   TUBAL LIGATION  1980's   UPPER EXTREMITY ANGIOGRAPHY Bilateral 12/06/2016   Procedure: Upper Extremity  Angiography;  Surgeon: Jama Cordella MATSU, MD;  Location: [facility] INVASIVE CV LAB;  Service: Cardiovascular;  Laterality: Bilateral;   UPPER EXTREMITY INTERVENTION Left 06/06/2017   Procedure: UPPER EXTREMITY INTERVENTION;  Surgeon: Jama Cordella MATSU, MD;  Location: [facility] INVASIVE CV LAB;  Service: Cardiovascular;  Laterality: Left;   UPPER EXTREMITY VENOGRAPHY Left 10/04/2022   Procedure: UPPER EXTREMITY VENOGRAPHY;  Surgeon: Jama Cordella MATSU, MD;  Location: [facility] INVASIVE CV LAB;  Service: Cardiovascular;  Laterality: Left;  Social History:  reports that she has never smoked. She has never used smokeless tobacco. She reports that she does not drink alcohol  and does not use drugs.   Allergies  Allergen Reactions   Amlodipine  Swelling   Aspirin  Other (See Comments)    High Doses Mess up her stomach; makes my bowels have blood in them. Takes 81 mg EC Aspirin     Nitrofurantoin Hives   Ranexa  [Ranolazine ] Other (See Comments)    Myoclonus-hospitalized    Bactrim [Sulfamethoxazole-Trimethoprim] Rash   Iodinated Contrast Media Itching and Other (See Comments)   Iron  Itching and Other (See Comments)    they gave me iron  in dialysis; had to give me Benadryl  cause I had to have the iron  (05/02/2012)   Tylenol  [Acetaminophen ] Itching and Other (See Comments)    Makes her feet on fire per pt - can tolerate per pt   Gabapentin  Other (See Comments)    Unknown reaction   Sucroferric Oxyhydroxide Other (See Comments)    Unknown   Levaquin  [Levofloxacin  In D5w] Rash   Levofloxacin  Rash and Other (See Comments)   Plavix  [Clopidogrel  Bisulfate] Rash   Protonix  [Pantoprazole  Sodium] Rash   Venofer [Ferric Oxide] Itching and Other (See Comments)    Patient reports using Benadryl  prior to doses as [facility]    Family History  Problem Relation Age of Onset   Heart disease Mother        Heart Disease before age 45   Hyperlipidemia Mother    Hypertension Mother    Diabetes Mother     Heart attack Mother    Heart disease Father        Heart Disease before age 64   Hyperlipidemia Father    Hypertension Father    Diabetes Father    Diabetes Sister    Hypertension Sister    Diabetes Brother    Hyperlipidemia Brother    Heart attack Brother    Hypertension Sister    Heart attack Brother    Colon cancer Child 10   Other Other        noncontributory for early CAD   Esophageal cancer Neg Hx    Liver disease Neg Hx    Kidney disease Neg Hx    Colon polyps Neg Hx      Prior to Admission medications   Medication Sig Start Date End Date Taking? Authorizing Provider  amiodarone  (PACERONE ) 200 MG tablet Take 200 mg by mouth daily. 05/31/23   [provider]  calcitRIOL  (ROCALTROL ) 0.25 MCG capsule Take 7 capsules (1.75 mcg total) by mouth every Monday, Wednesday, and Friday with hemodialysis. 12/21/22   Christobal Guadalajara, MD  cinacalcet  (SENSIPAR ) 30 MG tablet Take 1 tablet (30 mg total) by mouth every Monday, Wednesday, and Friday with hemodialysis. 12/19/22   Christobal Guadalajara, MD  cyanocobalamin  (VITAMIN B12) 1000 MCG/ML injection Inject 1,000 mcg into the muscle every 30 (thirty) days.    [provider]  cyclobenzaprine  (FLEXERIL ) 5 MG tablet Take 5 mg by mouth 3 (three) times daily as needed for muscle spasms.    [provider]  folic acid  (FOLVITE ) 1 MG tablet Take 1 mg by mouth daily. 05/24/22   [provider]  gabapentin  (NEURONTIN ) 100 MG capsule Take 1 capsule (100 mg total) by mouth every evening. 06/05/23 07/05/23  Raenelle Coria, MD  hydrALAZINE  (APRESOLINE ) 25 MG tablet Take 1 tablet (25 mg total) by mouth 2 (two) times daily. Hold for sbp <110 12/27/22   Christobal Guadalajara,  MD  hydrOXYzine  (ATARAX ) 25 MG tablet Take 25 mg by mouth every 6 (six) hours as needed for itching. 06/06/22   [provider]  levETIRAcetam  (KEPPRA ) 500 MG tablet Take 1 tablet (500 mg total) by mouth daily. 11/15/22   Singh, Prashant K, MD  levothyroxine  (SYNTHROID )  75 MCG tablet Take 1 tablet (75 mcg total) by mouth daily at 6 (six) AM. 02/02/23   Maree, Pratik D, DO  metoprolol  tartrate (LOPRESSOR ) 25 MG tablet Take 0.5 tablets (12.5 mg total) by mouth 2 (two) times daily. 02/01/23 06/01/23  Maree, Pratik D, DO  multivitamin (RENA-VIT) TABS tablet Take 1 tablet by mouth daily.    [provider]  nitroGLYCERIN  (NITROSTAT ) 0.4 MG SL tablet Place 0.4 mg under the tongue every 5 (five) minutes as needed for chest pain.    [provider]  octreotide  (SANDOSTATIN  LAR) 20 MG injection Inject 20 mg into the muscle every 28 (twenty-eight) days. 11/15/22   Singh, Prashant K, MD  olmesartan (BENICAR) 20 MG tablet Take 20 mg by mouth daily. 01/14/23   [provider]  omeprazole  (PRILOSEC) 40 MG capsule Take 40 mg by mouth daily. 05/31/23   [provider]  ondansetron  (ZOFRAN -ODT) 4 MG disintegrating tablet Take 4 mg by mouth every 8 (eight) hours as needed for nausea or vomiting. 05/22/23   [provider]  rosuvastatin  (CRESTOR ) 10 MG tablet Take 1 tablet by mouth once daily 06/06/22   Strader, Laymon HERO, PA-C    Physical Exam: BP (!) 184/72 (BP Location: Right Wrist)   Pulse (!) 47   Temp 97.7 F (36.5 C) (Oral)   Resp 18   Ht 5' 2 (1.575 m)   Wt 57.2 kg   SpO2 95%   BMI 23.06 kg/m   General: 82 y.o. year-old female well developed well nourished in no acute distress.  Alert and oriented x3. HEENT: NCAT, EOMI Neck: Supple, trachea medial Cardiovascular: Regular rate and rhythm with no rubs or gallops.  No thyromegaly or JVD noted.  B/L lower extremity edema. 2/4 pulses in all 4 extremities. Respiratory: Clear to auscultation with no wheezes or rales. Good inspiratory effort. Abdomen: Soft, nontender nondistended with normal bowel sounds x4 quadrants. Muskuloskeletal: AV fistula in left arm is currently not working, Port-A-Cath noted on left side of chest.  No cyanosis, clubbing noted bilaterally Neuro: CN II-XII  intact, strength 5/5 x 4, sensation, reflexes intact Skin: No ulcerative lesions noted or rashes Psychiatry: Judgement and insight appear normal. Mood is appropriate for condition and setting          Labs on Admission:  Basic Metabolic Panel: Recent Labs  Lab 07/10/23 1514  NA 133*  K 3.6  CL 99  CO2 22  GLUCOSE 107*  BUN 34*  CREATININE 5.84*  CALCIUM  8.9   Liver Function Tests: No results for input(s): AST, ALT, ALKPHOS, BILITOT, PROT, ALBUMIN  in the last 168 hours. No results for input(s): LIPASE, AMYLASE in the last 168 hours. No results for input(s): AMMONIA in the last 168 hours. CBC: Recent Labs  Lab 07/10/23 1514  WBC 4.2  NEUTROABS 2.9  HGB 6.8*  HCT 21.0*  MCV 116.7*  PLT 202   Cardiac Enzymes: No results for input(s): CKTOTAL, CKMB, CKMBINDEX, TROPONINI in the last 168 hours.  BNP (last 3 results) Recent Labs    08/26/22 1410 10/23/22 1420 05/13/23 1500  BNP 1,590.0* 2,205.0* 3,351.0*    ProBNP (last 3 results) No results for input(s): PROBNP in the last  8760 hours.  CBG: No results for input(s): GLUCAP in the last 168 hours.  Radiological Exams on Admission: No results found.  EKG: I independently viewed the EKG done and my findings are as followed: Sinus bradycardia at a rate of 45 bpm with IVCD and LBBB  Assessment/Plan Present on Admission:  Symptomatic anemia  Macrocytic anemia  PAF (paroxysmal atrial fibrillation) (HCC)  Chronic combined systolic and diastolic CHF (congestive heart failure) (HCC)  Essential hypertension  Mixed hyperlipidemia  Acquired hypothyroidism  Active Problems:   Macrocytic anemia   Essential hypertension   PAF (paroxysmal atrial fibrillation) (HCC)   Acquired hypothyroidism   ESRD on hemodialysis (HCC)   Mixed hyperlipidemia   Chronic combined systolic and diastolic CHF (congestive heart failure) (HCC)   Symptomatic anemia   Sinus bradycardia  Symptomatic anemia H/H=  hemoglobin/hematocrit 6.8/21 (this was 8.7/26.1 on 06/05/2023) Type and crossmatch was done 1 unit of PRBC was ordered to be transfused Anemia may be due to patient's ESRD  Macrocytic anemia MCV 116.7 Folate and vitamin B12 levels will be checked Continue with B12 and folic acid   Sinus bradycardia Patient's HR in the 40s, cardiologist on-call (Dr. Burna) was consulted and recommended holding metoprolol  and transfuse PRBCs and that if patient remains bradycardic or has any other dynamic EKG changes, cardiology can be reconsulted.  ESRD on hemodialysis (MWF) Nephrologist (Dr. Melia) was consulted and will arrange for dialysis in the morning Continue calcitriol  and Sensipar   Paroxysmal atrial fibrillation EKG currently shows sinus bradycardia at a rate of 45 bpm Continue rhythm control with amiodarone ; metoprolol  will be temporarily held at this time due to cardiologist recommendation per EDP  Chronic combined systolic and diastolic CHF with EF of 30 to 54% Patient complained of generalized weakness 1 unit of PRBC will be transfused  Essential hypertension Continue IV hydralazine  10 mg every 6 as needed for SBP > 170 Continue home hydralazine   Mixed hyperlipidemia Continue Crestor   Acquired hypothyroidism Continue Synthroid   Type 2 diabetes mellitus Diet controlled  DVT prophylaxis: SCDs  Code Status: Full code  Family Communication: Husband at bedside (all questions answered to satisfaction)  Consults: Nephrology  Severity of Illness: The appropriate patient status for this patient is OBSERVATION. Observation status is judged to be reasonable and necessary in order to provide the required intensity of service to ensure the patient's safety. The patient's presenting symptoms, physical exam findings, and initial radiographic and laboratory data in the context of their medical condition is felt to place them at decreased risk for further clinical deterioration. Furthermore,  it is anticipated that the patient will be medically stable for discharge from the hospital within 2 midnights of admission.   Author: Khaleel Beckom, DO 07/11/2023 1:54 AM  For on call review [email or web address].

## 2023-07-11 NOTE — Care Management Obs Status (Signed)
 MEDICARE OBSERVATION STATUS NOTIFICATION   Patient Details  Name: Shawna [name] MRN: [id number] Date of Birth: [birth year]   Medicare Observation Status Notification Given:  Yes    Villa Herb, LCSWA 07/11/2023, 12:16 PM

## 2023-07-11 NOTE — Evaluation (Signed)
 Occupational Therapy Evaluation Patient Details Name: Shawna [name] MRN: [id number] DOB: July 03, 1941 Today's Date: 07/11/2023   History of Present Illness Shawna [name] is a 82 y.o. female with medical history significant for ESRD on hemodialysis (MWF), recurrent GI bleeds possibly due to AVMs, paroxysmal atrial fibrillation no longer on anticoagulation, CAD status post CABG and PCI who presents to the emergency department.  Patient states that she was called today by outpatient center that her hemoglobin was in the 3's and patient was unable to undergo dialysis, she was asked to go to the ED for further evaluation and management.  Patient's hemoglobin about 4 weeks ago was 8.1, she has been complaining of about 1 month of increased generalized weakness.  She denies any known blood loss or black stools.  She was last dialyzed about 3 days ago. (per DO)   Clinical Impression   Pt agreeable to OT and PT co-evaluation. Pt reports having husband for support at home as needed. Pt was able to transfer without can and CGA to min A. More CGA needed when using RW for transfers and ambulation. Able to don socks seated in the chair without assist. Generally weak with limited A/ROM for shoulder flexion seemingly due to weakness with near full P/ROM. Pt left in the chair with call bell within reach and chair alarm set. Pt will benefit from continued OT in the hospital and recommended venue below to increase strength, balance, and endurance for safe ADL's.          If plan is discharge home, recommend the following: A little help with walking and/or transfers;A little help with bathing/dressing/bathroom;Assistance with cooking/housework;Assist for transportation;Help with stairs or ramp for entrance    Functional Status Assessment  Patient has had a recent decline in their functional status and demonstrates the ability to make significant improvements in function in a reasonable and predictable amount of time.   Equipment Recommendations  None recommended by OT           Precautions / Restrictions Precautions Precautions: Fall Restrictions Weight Bearing Restrictions Per Provider Order: No      Mobility Bed Mobility Overal bed mobility: Needs Assistance Bed Mobility: Supine to Sit     Supine to sit: Supervision     General bed mobility comments: Labored movement; extended time    Transfers Overall transfer level: Needs assistance Equipment used: Rolling walker (2 wheels) Transfers: Sit to/from Stand, Bed to chair/wheelchair/BSC Sit to Stand: Contact guard assist     Step pivot transfers: Contact guard assist, Min assist     General transfer comment: CGA with RW; min A to CGA with cane.      Balance Overall balance assessment: Needs assistance Sitting-balance support: No upper extremity supported, Feet supported Sitting balance-Leahy Scale: Good Sitting balance - Comments: seated at EOB   Standing balance support: Bilateral upper extremity supported, During functional activity Standing balance-Leahy Scale: Fair Standing balance comment: using RW                           ADL either performed or assessed with clinical judgement   ADL Overall ADL's : Needs assistance/impaired     Grooming: Set up;Sitting       Lower Body Bathing: Set up;Sitting/lateral leans       Lower Body Dressing: Set up;Sitting/lateral leans   Toilet Transfer: Contact guard assist;Rolling walker (2 wheels);Ambulation Toilet Transfer Details (indicate cue type and reason): Simulated via ambulation in the hall  and EOB to chair. Toileting- Clothing Manipulation and Hygiene: Set up;Sitting/lateral lean       Functional mobility during ADLs: Contact guard assist;Rolling walker (2 wheels)       Vision Baseline Vision/History: 1 Wears glasses Ability to See in Adequate Light: 1 Impaired Patient Visual Report: No change from baseline Vision Assessment?: No apparent visual  deficits     Perception Perception: Not tested       Praxis Praxis: Not tested       Pertinent Vitals/Pain Pain Assessment Pain Assessment: No/denies pain     Extremity/Trunk Assessment Upper Extremity Assessment Upper Extremity Assessment: Generalized weakness (3-/5 MMT bilateral shoulder flexion.)   Lower Extremity Assessment Lower Extremity Assessment: Defer to PT evaluation   Cervical / Trunk Assessment Cervical / Trunk Assessment: Kyphotic   Communication Communication Communication: No apparent difficulties   Cognition Arousal: Alert Behavior During Therapy: WFL for tasks assessed/performed Overall Cognitive Status: Within Functional Limits for tasks assessed                                                        Home Living Family/patient expects to be discharged to:: Private residence Living Arrangements: Spouse/significant other Available Help at Discharge: Family;Available 24 hours/day Type of Home: House Home Access: Stairs to enter [employer] of Steps: 2 Entrance Stairs-Rails: Right Home Layout: One level     Bathroom Shower/Tub: Chief Strategy Officer: Standard Bathroom Accessibility: Yes   Home Equipment: Rollator (4 wheels);Cane - single point;Shower seat;BSC/3in1   Additional Comments: Pt reported living ni the same place.      Prior Functioning/Environment Prior Level of Function : Needs assist             Mobility Comments: Pt abulates using cane. ADLs Comments: Pt reports independence for ADL's. IADL assist.        OT Problem List: Decreased strength;Decreased range of motion;Decreased activity tolerance;Impaired balance (sitting and/or standing)      OT Treatment/Interventions: Self-care/ADL training;Therapeutic exercise;Therapeutic activities;Patient/family education    OT Goals(Current goals can be found in the care plan section) Acute Rehab OT Goals Patient Stated Goal:  return home OT Goal Formulation: With patient Time For Goal Achievement: 07/25/23 Potential to Achieve Goals: Good  OT Frequency: Min 1X/week    Co-evaluation PT/OT/SLP Co-Evaluation/Treatment: Yes Reason for Co-Treatment: To address functional/ADL transfers   OT goals addressed during session: ADL's and self-care      AM-PAC OT 6 Clicks Daily Activity     Outcome Measure Help from another person eating meals?: None Help from another person taking care of personal grooming?: A Little Help from another person toileting, which includes using toliet, bedpan, or urinal?: A Little Help from another person bathing (including washing, rinsing, drying)?: A Little Help from another person to put on and taking off regular upper body clothing?: None Help from another person to put on and taking off regular lower body clothing?: A Little 6 Click Score: 20   End of Session Equipment Utilized During Treatment: Rolling walker (2 wheels)  Activity Tolerance: Patient tolerated treatment well Patient left: in chair;with call bell/phone within reach;with chair alarm set  OT Visit Diagnosis: Unsteadiness on feet (R26.81);Other abnormalities of gait and mobility (R26.89);Muscle weakness (generalized) (M62.81)  Time: 9145-9082 OT Time Calculation (min): 23 min Charges:  OT General Charges $OT Visit: 1 Visit OT Evaluation $OT Eval Low Complexity: 1 Low  Becka Lagasse OT, MOT  Jayson Person 07/11/2023, 10:36 AM

## 2023-07-11 NOTE — Evaluation (Signed)
 Physical Therapy Evaluation Patient Details Name: Shawna [name] MRN: [id number] DOB: August 05, 1940 Today's Date: 07/11/2023  History of Present Illness  Shawna [name] is a 82 y.o. female with medical history significant for ESRD on hemodialysis (MWF), recurrent GI bleeds possibly due to AVMs, paroxysmal atrial fibrillation no longer on anticoagulation, CAD status post CABG and PCI who presents to the emergency department.  Patient states that she was called today by outpatient center that her hemoglobin was in the 3's and patient was unable to undergo dialysis, she was asked to go to the ED for further evaluation and management.  Patient's hemoglobin about 4 weeks ago was 8.1, she has been complaining of about 1 month of increased generalized weakness.  She denies any known blood loss or black stools.  She was last dialyzed about 3 days ago.   Clinical Impression  Patient requires frequent rest breaks during supine to sitting and scooting to EOB, unsteady and unsafe using quad cane, required use of RW for safety and demonstrates good return for ambulating in room/hallway without loss of balance.  Patient limited for gait training mostly due to fatigue and tolerated sitting up in chair after therapy.  Patient will benefit from continued skilled physical therapy in hospital and recommended venue below to increase strength, balance, endurance for safe ADLs and gait.       If plan is discharge home, recommend the following: A little help with walking and/or transfers;A little help with bathing/dressing/bathroom;Help with stairs or ramp for entrance;Assistance with cooking/housework   Can travel by private vehicle        Equipment Recommendations None recommended by PT  Recommendations for Other Services       Functional Status Assessment Patient has had a recent decline in their functional status and demonstrates the ability to make significant improvements in function in a reasonable and predictable  amount of time.     Precautions / Restrictions Precautions Precautions: Fall Restrictions Weight Bearing Restrictions Per Provider Order: No      Mobility  Bed Mobility Overal bed mobility: Needs Assistance Bed Mobility: Supine to Sit     Supine to sit: Supervision     General bed mobility comments: increased time, labored movement    Transfers Overall transfer level: Needs assistance Equipment used: Rolling walker (2 wheels) Transfers: Sit to/from Stand, Bed to chair/wheelchair/BSC Sit to Stand: Contact guard assist   Step pivot transfers: Contact guard assist, Min assist       General transfer comment: slow labored movement with poor standing balance using SPC, safer using RW    Ambulation/Gait Ambulation/Gait assistance: Contact guard assist Gait Distance (Feet): 40 Feet Assistive device: Rolling walker (2 wheels) Gait Pattern/deviations: Decreased step length - right, Decreased step length - left, Decreased stride length, Trunk flexed Gait velocity: decreased     General Gait Details: slow labored cadence without loss of balance, limited mostly due to fatigue  Stairs            Wheelchair Mobility     Tilt Bed    Modified Rankin (Stroke Patients Only)       Balance Overall balance assessment: Needs assistance Sitting-balance support: No upper extremity supported, Feet supported Sitting balance-Leahy Scale: Good Sitting balance - Comments: seated at EOB   Standing balance support: Bilateral upper extremity supported, During functional activity Standing balance-Leahy Scale: Fair Standing balance comment: using RW  Pertinent Vitals/Pain Pain Assessment Pain Assessment: No/denies pain    Home Living Family/patient expects to be discharged to:: Private residence Living Arrangements: Spouse/significant other Available Help at Discharge: Family;Available 24 hours/day Type of Home: House Home Access:  Stairs to enter Entrance Stairs-Rails: Right Entrance Stairs-Number of Steps: 2   Home Layout: One level Home Equipment: Rollator (4 wheels);Cane - single point;Shower seat;BSC/3in1 Additional Comments: Pt reported living ni the same place.    Prior Function Prior Level of Function : Needs assist       Physical Assist : Mobility (physical);ADLs (physical) Mobility (physical): Bed mobility;Transfers;Gait;Stairs   Mobility Comments: Household ambulation using SPC ADLs Comments: Pt reports independence for ADL's. IADL assist.     Extremity/Trunk Assessment   Upper Extremity Assessment Upper Extremity Assessment: Defer to OT evaluation    Lower Extremity Assessment Lower Extremity Assessment: Generalized weakness    Cervical / Trunk Assessment Cervical / Trunk Assessment: Kyphotic  Communication   Communication Communication: No apparent difficulties Cueing Techniques: Verbal cues;Tactile cues  Cognition Arousal: Alert Behavior During Therapy: WFL for tasks assessed/performed Overall Cognitive Status: Within Functional Limits for tasks assessed                                          General Comments      Exercises     Assessment/Plan    PT Assessment Patient needs continued PT services  PT Problem List Decreased strength;Decreased activity tolerance;Decreased balance;Decreased mobility       PT Treatment Interventions DME instruction;Gait training;Stair training;Functional mobility training;Therapeutic activities;Therapeutic exercise;Balance training;Patient/family education    PT Goals (Current goals can be found in the Care Plan section)  Acute Rehab PT Goals Patient Stated Goal: return home with family to assist PT Goal Formulation: With patient Time For Goal Achievement: 07/14/23 Potential to Achieve Goals: Good    Frequency Min 3X/week     Co-evaluation PT/OT/SLP Co-Evaluation/Treatment: Yes Reason for Co-Treatment: To address  functional/ADL transfers PT goals addressed during session: Mobility/safety with mobility;Balance;Proper use of DME OT goals addressed during session: ADL's and self-care       AM-PAC PT 6 Clicks Mobility  Outcome Measure Help needed turning from your back to your side while in a flat bed without using bedrails?: None Help needed moving from lying on your back to sitting on the side of a flat bed without using bedrails?: A Little Help needed moving to and from a bed to a chair (including a wheelchair)?: A Little Help needed standing up from a chair using your arms (e.g., wheelchair or bedside chair)?: A Little Help needed to walk in hospital room?: A Little Help needed climbing 3-5 steps with a railing? : A Lot 6 Click Score: 18    End of Session   Activity Tolerance: Patient tolerated treatment well;Patient limited by fatigue Patient left: in chair;with call bell/phone within reach Nurse Communication: Mobility status PT Visit Diagnosis: Unsteadiness on feet (R26.81);Other abnormalities of gait and mobility (R26.89);Muscle weakness (generalized) (M62.81)    Time: 9145-9083 PT Time Calculation (min) (ACUTE ONLY): 22 min   Charges:   PT Evaluation $PT Eval Moderate Complexity: 1 Mod PT Treatments $Therapeutic Activity: 8-22 mins PT General Charges $$ ACUTE PT VISIT: 1 Visit         12:07 PM, 07/11/23 Lynwood Music, MPT Physical Therapist with [facility] [phone] office [phone] mobile phone

## 2023-07-11 NOTE — TOC Initial Note (Signed)
 Transition of Care [facility]) - Initial/Assessment Note    Patient Details  Name: Shawna [name] MRN: [id number] Date of Birth: [birth year]  [facility]) CM/SW Contact:    Lucie Lunger, LCSWA Phone Number: 07/11/2023, 12:19 PM  Clinical Narrative:                 CSW updated that PT is recommending HH PT for pt at D/C. CSW spoke with pts husband who states they live in the home together. He is able to assist with ADLs when needed and provide transportation for pt. Pt has HH with [facility], CSW spoke to Devere [phone] who states pt is active with [facility] PT/OT/RN/Aide, will need new orders faxed to [phone]. Pt has a cane, walker and wheelchair in the home. TOC to follow.   Expected Discharge Plan: Home w Home Health Services Barriers to Discharge: Continued Medical Work up   Patient Goals and CMS Choice Patient states their goals for this hospitalization and ongoing recovery are:: return home CMS Medicare.gov Compare Post Acute Care list provided to:: Patient Represenative (must comment) Choice offered to / list presented to : Spouse      Expected Discharge Plan and Services In-house Referral: Clinical Social Work Discharge Planning Services: CM Consult Post Acute Care Choice: Home Health Living arrangements for the past 2 months: Single Family Home                           HH Arranged: RN, PT, OT, Nurse's Aide HH Agency: Other - See comment Risk Manager) Date HH Agency Contacted: 07/11/23   Representative spoke with at [facility] Agency: Devere  Prior Living Arrangements/Services Living arrangements for the past 2 months: Single Family Home Lives with:: Spouse Patient language and need for interpreter reviewed:: Yes Do you feel safe going back to the place where you live?: Yes      Need for Family Participation in Patient Care: Yes (Comment) Care giver support system in place?: Yes (comment) Current home services: DME Criminal Activity/Legal Involvement Pertinent  to Current Situation/Hospitalization: No - Comment as needed  Activities of Daily Living   ADL Screening (condition at time of admission) Independently performs ADLs?: No Does the patient have a NEW difficulty with bathing/dressing/toileting/self-feeding that is expected to last >3 days?: Yes (Initiates electronic notice to provider for possible OT consult) Does the patient have a NEW difficulty with getting in/out of bed, walking, or climbing stairs that is expected to last >3 days?: Yes (Initiates electronic notice to provider for possible PT consult) Does the patient have a NEW difficulty with communication that is expected to last >3 days?: No Is the patient deaf or have difficulty hearing?: No Does the patient have difficulty seeing, even when wearing glasses/contacts?: No Does the patient have difficulty concentrating, remembering, or making decisions?: No  Permission Sought/Granted                  Emotional Assessment Appearance:: Appears stated age Attitude/Demeanor/Rapport: Engaged Affect (typically observed): Accepting   Alcohol  / Substance Use: Not Applicable Psych Involvement: No (comment)  Admission diagnosis:  Sinus bradycardia [R00.1] Generalized weakness [R53.1] Symptomatic anemia [D64.9] Patient Active Problem List   Diagnosis Date Noted   Sinus bradycardia 07/11/2023   FTT (failure to thrive) in adult 06/04/2023   Symptomatic anemia 03/08/2023   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   Acute blood loss anemia 01/27/2023   Paroxysmal atrial flutter (HCC) 12/14/2022   Pressure injury of  skin 12/01/2022   Rectal bleeding 11/30/2022   Generalized abdominal pain 11/10/2022   Ileus (HCC) 11/04/2022   Periumbilical abdominal pain 11/04/2022   Macrocytic anemia 10/29/2022   Anemia of chronic disease 10/29/2022   Upper GI bleed 10/29/2022   Iron  deficiency anemia due to chronic blood loss 08/26/2022   Benign neoplasm of transverse colon  08/11/2022   Benign neoplasm of descending colon 08/11/2022   Benign neoplasm of sigmoid colon 08/11/2022   Angiodysplasia of duodenum 08/11/2022   Delirium 04/22/2022   History of arteriovenous malformation (AVM) 04/22/2022   Acute metabolic encephalopathy 11/09/2021   NSTEMI, initial episode of care (HCC) 10/19/2021   Acute on chronic blood loss anemia 09/09/2021   Ischemic cardiomyopathy    Atrial fibrillation, chronic (HCC) 05/07/2021   Neuropathy 04/23/2021   Anemia associated with chronic renal failure 04/13/2021   Left knee pain 03/15/2021   Moderate protein-calorie malnutrition (HCC) 02/27/2021   Hypokalemia 02/27/2021   COVID-19 virus infection 02/03/2021   Leukocytosis 02/03/2021   Acquired hypothyroidism 02/03/2021   Myositis 12/03/2020   Ataxia 12/02/2020   Diabetes mellitus type 2 in nonobese (HCC) 11/10/2020   Failure to thrive in adult 11/09/2020   Dialysis AV fistula malfunction, initial encounter (HCC)    Jugular vein occlusion, right (HCC)    Failure of surgically constructed arteriovenous fistula (HCC) 10/03/2020   Myoclonus 08/31/2020   Clotted renal dialysis AV graft, initial encounter (HCC)    Hemodialysis-associated hypotension    Irritable bowel syndrome 02/25/2020   Adenomatous duodenal polyp 09/10/2019   History of GI bleed 09/10/2019   Hand steal syndrome (HCC) 08/01/2017   CAD (coronary artery disease) 06/05/2017   Intestinal ischemia (HCC)    Complication of vascular access for dialysis 03/19/2017   Preoperative clearance 01/25/2017   H/O non-ST elevation myocardial infarction (NSTEMI) 10/24/2016   Non-ST elevation (NSTEMI) myocardial infarction [facility])    Heme positive stool    Cardiac arrest [facility])    Palliative care encounter    Goals of care, counseling/discussion    Flash pulmonary edema (HCC) 04/06/2016   History of colon cancer 01/27/2016   History of ovarian cancer 01/27/2016   PAF (paroxysmal atrial fibrillation) (HCC) 10/14/2015    Malignant neoplasm of right ovary (HCC) 10/14/2015   Wide-complex tachycardia 09/08/2015   SVT (supraventricular tachycardia) (HCC) 09/08/2015   Essential hypertension    Dyspnea    Constipation 03/12/2013   Abdominal wall pain in left flank 03/12/2013   Occlusion and stenosis of carotid artery without mention of cerebral infarction 01/24/2013   Hx of CABG 07/05/2012   Carotid artery disease (HCC) 07/05/2012   Mitral regurgitation 06/12/2012   Non-STEMI (non-ST elevated myocardial infarction) (HCC) 06/08/2012   Chronic combined systolic and diastolic CHF (congestive heart failure) (HCC) 05/02/2012   AVM (arteriovenous malformation) of small bowel, acquired with hemorrhage 01/20/2012   GERD (gastroesophageal reflux disease) 01/09/2012   Mixed hyperlipidemia 01/05/2012   Atherosclerotic heart disease of native coronary artery without angina pectoris 12/16/2011   ESRD on hemodialysis (HCC) 12/16/2011   Anxiety disorder 05/04/2011   End-stage renal disease on hemodialysis (HCC) 04/29/2011   Gout 04/29/2011   PCP:  Practice, [facility]:   [facility] [address] [place] - [address] [place] [address] Phone: [phone] Fax: [phone]  [facility] - [address] [place], [place] - [address] [place] [address] Phone: [phone] Fax: [phone]  [facility] [address]  [place] [address] Phone: [phone] Fax: [phone]     Social Drivers of Health (SDOH) Social History: SDOH Screenings   Food Insecurity: No Food Insecurity (07/10/2023)  Housing: Low Risk  (07/10/2023)  Transportation Needs: No Transportation Needs (07/10/2023)  Utilities: Not At Risk (07/10/2023)  Financial Resource Strain: Low Risk  (12/14/2018)  Physical Activity: Unknown (12/14/2018)  Social Connections: Patient Declined (07/11/2023)  Stress: No Stress Concern Present (12/14/2018)  Tobacco Use: Low Risk   (07/10/2023)  Health Literacy: Inadequate Health Literacy (06/27/2023)   Received from [facility]   SDOH Interventions:     Readmission Risk Interventions    05/02/2023    2:17 PM 01/29/2023   11:08 AM 10/31/2022   12:45 PM  Readmission Risk Prevention Plan  Transportation Screening Complete Complete Complete  Medication Review [facility]) Complete Complete Complete  PCP or Specialist appointment within 3-5 days of discharge  Complete   HRI or Home Care Consult Complete Complete Complete  SW Recovery Care/Counseling Consult Complete Complete Complete  Palliative Care Screening Not Applicable Not Applicable Not Applicable  Skilled Nursing Facility Not Applicable Not Applicable Not Applicable

## 2023-07-11 NOTE — Progress Notes (Signed)
 Blood was attempted to be infused. The patient was complaining of the IV site. The insertion site did appear red and warm to touch. Complained that site was hurting her. I advised her nurse to turn the blood off. MD notified and nursing supervisor notified the blood had been stopped.

## 2023-07-11 NOTE — Plan of Care (Signed)
  Problem: Acute Rehab PT Goals(only PT should resolve) Goal: Pt Will Go Supine/Side To Sit Outcome: Progressing Flowsheets (Taken 07/11/2023 1210) Pt will go Supine/Side to Sit:  with modified independence  with supervision Goal: Patient Will Perform Sitting Balance Outcome: Progressing Flowsheets (Taken 07/11/2023 1210) Patient will perform sitting balance: with contact guard assist Goal: Pt Will Transfer Bed To Chair/Chair To Bed Outcome: Progressing Flowsheets (Taken 07/11/2023 1210) Pt will Transfer Bed to Chair/Chair to Bed: with contact guard assist Goal: Pt Will Ambulate Outcome: Progressing Flowsheets (Taken 07/11/2023 1210) Pt will Ambulate:  75 feet  with contact guard assist  with rolling walker   12:10 PM, 07/11/23 Lynwood Music, MPT Physical Therapist with [facility] [phone] office [phone] mobile phone

## 2023-07-11 NOTE — Progress Notes (Signed)
 Robert RN attempted to place new IV with ultrasound x 2. Unable to place new IV, assessed patients IV in right upper arm with ultrasound. Reported in vein per ultrasound, flushing and having blood return, this clinical research associate and Geneticist, Molecular observed. Patient verbalized no complaints. MD Manfred made aware to place order for unit of blood.

## 2023-07-11 NOTE — Progress Notes (Addendum)
 Patients vital signs prior to starting blood transfusion T-97.8, P-45, R-17, BP-178/80, O2-98% RA. MD Manfred made aware. No new orders. Flushed IV to right upper arm, patient verbalized no complaints. Fluids running then blood begin, patient verbalized complaints of pain to arm, Geneticist, Molecular at bedside. Vital signs: T-97.7, BP-184/72, R-18, P-46, O2-95% at RA. Patient continued to complain of discomfort, flushed IV, patient verbalized complaints of pain. Metta RN assessed IV site, site warm to touch. MD Manfred made aware. Attempting to place new IV, unit of blood discarded per Metta RN.

## 2023-07-11 NOTE — Progress Notes (Signed)
 Mobility Specialist Progress Note:    07/11/23 1450  Mobility  Activity Ambulated with assistance in hallway  Level of Assistance Contact guard assist, steadying assist  Assistive Device Front wheel walker  Distance Ambulated (ft) 30 ft  Range of Motion/Exercises Active;All extremities  Activity Response Tolerated well  Mobility Referral Yes  Mobility visit 1 Mobility  Mobility Specialist Start Time (ACUTE ONLY) 1435  Mobility Specialist Stop Time (ACUTE ONLY) 1455  Mobility Specialist Time Calculation (min) (ACUTE ONLY) 20 min   Pt received in bed, agreeable to mobility. Required CGA to stand and ambulate with RW. Tolerated well, asx throughout. Returned to room, left pt supine. Alarm on, all needs met.    Sherrilee Ditty Mobility Specialist Please contact via Special Educational Needs Teacher or  Rehab office at [phone]

## 2023-07-12 DIAGNOSIS — R531 Weakness: Secondary | ICD-10-CM | POA: Diagnosis not present

## 2023-07-12 DIAGNOSIS — I5042 Chronic combined systolic (congestive) and diastolic (congestive) heart failure: Secondary | ICD-10-CM

## 2023-07-12 DIAGNOSIS — I10 Essential (primary) hypertension: Secondary | ICD-10-CM | POA: Diagnosis not present

## 2023-07-12 DIAGNOSIS — D649 Anemia, unspecified: Secondary | ICD-10-CM | POA: Diagnosis not present

## 2023-07-12 LAB — BPAM RBC
Blood Product Expiration Date: 202502022359
Blood Product Expiration Date: 202502042359
ISSUE DATE / TIME: 202412311634
ISSUE DATE / TIME: 202412310056
Unit Type and Rh: 5100
Unit Type and Rh: 202502042359
Unit Type and Rh: 5100

## 2023-07-12 LAB — CBC
HCT: 26.1 % — ABNORMAL LOW (ref 36.0–46.0)
Hemoglobin: 8.8 g/dL — ABNORMAL LOW (ref 12.0–15.0)
MCH: 35.8 pg — ABNORMAL HIGH (ref 26.0–34.0)
MCHC: 33.7 g/dL (ref 30.0–36.0)
MCV: 106.1 fL — ABNORMAL HIGH (ref 80.0–100.0)
Platelets: 188 10*3/uL (ref 150–400)
RBC: 2.46 MIL/uL — ABNORMAL LOW (ref 3.87–5.11)
RDW: 26.2 % — ABNORMAL HIGH (ref 11.5–15.5)
WBC: 4.5 10*3/uL (ref 4.0–10.5)
nRBC: 0 % (ref 0.0–0.2)

## 2023-07-12 LAB — TYPE AND SCREEN
ABO/RH(D): O POS
Antibody Screen: NEGATIVE
Donor AG Type: NEGATIVE
Donor AG Type: NEGATIVE
Unit division: 0
Unit division: 0

## 2023-07-12 NOTE — Progress Notes (Signed)
 Mobility Specialist Progress Note:    07/12/23 1456  Mobility  Activity Ambulated with assistance in hallway  Level of Assistance Contact guard assist, steadying assist  Assistive Device Front wheel walker  Distance Ambulated (ft) 40 ft  Range of Motion/Exercises Active;All extremities  Activity Response Tolerated well  Mobility Referral Yes  Mobility visit 1 Mobility  Mobility Specialist Start Time (ACUTE ONLY) 1430  Mobility Specialist Stop Time (ACUTE ONLY) 1455  Mobility Specialist Time Calculation (min) (ACUTE ONLY) 25 min   Pt received in chair, agreeable to mobility. Required CGA to stand and ambulate with RW. Tolerated well, asx throughout. Returned to room, left in chair. Alarm on and call bell in hand. All needs met.   Sherrilee Ditty Mobility Specialist Please contact via Special Educational Needs Teacher or  Rehab office at [phone]

## 2023-07-12 NOTE — Discharge Summary (Addendum)
 Physician Discharge Summary   Patient: Shawna [name] MRN: [id number] DOB: December 15, 1940  Admit date:     07/10/2023  Discharge date: 07/12/23  Discharge Physician: Eric Nunnery   PCP: [facility]   Recommendations at discharge:  Repeat CBC to follow hemoglobin trend/stability Reassess blood pressure and adjust antihypertensive regimen as required.  Discharge Diagnoses: Active Problems:   Macrocytic anemia   Essential hypertension   PAF (paroxysmal atrial fibrillation) (HCC)   Acquired hypothyroidism   ESRD on hemodialysis (HCC)   Mixed hyperlipidemia   Chronic combined systolic and diastolic CHF (congestive heart failure) (HCC)   Symptomatic anemia   Sinus bradycardia   Generalized weakness  Brief Hospital admission course: As per H&P written by Dr. Manfred on 07/11/2023 Shawna [name] is a 83 y.o. female with medical history significant for ESRD on hemodialysis (MWF), recurrent GI bleeds possibly due to AVMs, paroxysmal atrial fibrillation no longer on anticoagulation, CAD status post CABG and PCI who presents to the emergency department.  Patient states that she was called today by outpatient center that her hemoglobin was in the 3's and patient was unable to undergo dialysis, she was asked to go to the ED for further evaluation and management.  Patient's hemoglobin about 4 weeks ago was 8.1, she has been complaining of about 1 month of increased generalized weakness.  She denies any known blood loss or black stools.  She was last dialyzed about 3 days ago.   ED Course:  In the emergency department, BP was 164/54, pulse 44 bpm.  Workup in the ED showed CBC 4.2, hemoglobin/hematocrit 6.8/21 (this was 8.7/26.1 on 06/05/2023), MCV 116.7, platelets 202.  BMP was normal except for sodium of 133 and blood glucose of 107 and BUN 34, creatinine 5.84.  FOBT was negative.  Type and screen was done and 1 unit of blood was ordered to be transfused in the ED.    Assessment and  Plan: 1-symptomatic anemia -Patient with anemia chronic kidney disease and prior multiple events of GI bleed. -No overt bleeding currently appreciated -Patient has been adequately transfused with hemoglobin of 8.8 at discharge -Hemodynamically stable -Outpatient follow-up for Epogen /IV iron  as per nephrology discretion -Continue to follow hemoglobin trend.  2-end-stage renal disease -Last hemodialysis on 07/12/2023 in order to keep patient scheduled -Next treatment anticipated on 07/14/2023. -Continue follow-up with nephrologist service as an outpatient.  3-paroxysmal atrial fibrillation -Rate controlled and stable -Continue adjusted dose of metoprolol  and the use of amiodarone  -Patient no longer on anticoagulation therapy given ongoing history of anemia and previous GI bleed. -Continue patient follow-up with cardiology service.  4-chronic combined systolic and diastolic heart failure -Ejection fraction 30 to 45% -Continue treatment with beta-blocker -Volume management with hemodialysis treatments -Patient advised to check weight on daily basis and to follow low-sodium diet.  5-hypertension -Stable and well-controlled -Resume home antihypertensive agents -Heart healthy low-sodium diet discussed with patient.  6-hyperlipidemia -Continue Crestor .  7-type 2 diabetes with nephropathy -Continue diet management -Outpatient follow-up with PCP to further evaluate CBGs fluctuation and A1c trend.  8-acquired hypothyroidism -Continue Synthroid .  Consultants: Nephrology service Procedures performed: See below for x-ray report. Disposition: Home Diet recommendation: Heart healthy/low-sodium and modified carbohydrate diet.  DISCHARGE MEDICATION: Allergies as of 07/12/2023       Reactions   Amlodipine  Swelling   Aspirin  Other (See Comments)   High Doses Mess up her stomach; makes my bowels have blood in them. Takes 81 mg EC Aspirin     Nitrofurantoin Hives   Ranexa  [  ranolazine ]  Other (See Comments)   Myoclonus-hospitalized    Bactrim [sulfamethoxazole-trimethoprim] Rash   Iodinated Contrast Media Itching, Other (See Comments)   Iron  Itching, Other (See Comments)   they gave me iron  in dialysis; had to give me Benadryl  cause I had to have the iron  (05/02/2012)   Tylenol  [acetaminophen ] Itching, Other (See Comments)   Makes her feet on fire per pt - can tolerate per pt   Gabapentin  Other (See Comments)   Unknown reaction   Sucroferric Oxyhydroxide Other (See Comments)   Unknown   Levaquin  [levofloxacin  In D5w] Rash   Levofloxacin  Rash, Other (See Comments)   Plavix  [clopidogrel  Bisulfate] Rash   Protonix  [pantoprazole  Sodium] Rash   Venofer [ferric Oxide] Itching, Other (See Comments)   Patient reports using Benadryl  prior to doses as [facility]        Medication List     STOP taking these medications    olmesartan 20 MG tablet Commonly known as: BENICAR       TAKE these medications    amiodarone  200 MG tablet Commonly known as: PACERONE  Take 200 mg by mouth daily.   calcitRIOL  0.25 MCG capsule Commonly known as: ROCALTROL  Take 7 capsules (1.75 mcg total) by mouth every Monday, Wednesday, and Friday with hemodialysis.   cinacalcet  30 MG tablet Commonly known as: SENSIPAR  Take 1 tablet (30 mg total) by mouth every Monday, Wednesday, and Friday with hemodialysis.   cyanocobalamin  1000 MCG/ML injection Commonly known as: VITAMIN B12 Inject 1,000 mcg into the muscle every 30 (thirty) days.   cyclobenzaprine  5 MG tablet Commonly known as: FLEXERIL  Take 5 mg by mouth 3 (three) times daily as needed for muscle spasms.   folic acid  1 MG tablet Commonly known as: FOLVITE  Take 1 mg by mouth daily.   gabapentin  100 MG capsule Commonly known as: NEURONTIN  Take 1 capsule (100 mg total) by mouth every evening.   hydrALAZINE  25 MG tablet Commonly known as: APRESOLINE  Take 1 tablet (25 mg total) by mouth 2 (two) times daily. Hold for sbp  <110   hydrOXYzine  25 MG tablet Commonly known as: ATARAX  Take 25 mg by mouth every 6 (six) hours as needed for itching.   levETIRAcetam  500 MG tablet Commonly known as: KEPPRA  Take 1 tablet (500 mg total) by mouth daily.   levothyroxine  75 MCG tablet Commonly known as: SYNTHROID  Take 1 tablet (75 mcg total) by mouth daily at 6 (six) AM.   metoprolol  tartrate 25 MG tablet Commonly known as: LOPRESSOR  Take 0.5 tablets (12.5 mg total) by mouth 2 (two) times daily.   multivitamin Tabs tablet Take 1 tablet by mouth daily.   nitroGLYCERIN  0.4 MG SL tablet Commonly known as: NITROSTAT  Place 0.4 mg under the tongue every 5 (five) minutes as needed for chest pain.   omeprazole  40 MG capsule Commonly known as: PRILOSEC Take 40 mg by mouth daily.   ondansetron  4 MG disintegrating tablet Commonly known as: ZOFRAN -ODT Take 4 mg by mouth every 8 (eight) hours as needed for nausea or vomiting.   rosuvastatin  10 MG tablet Commonly known as: CRESTOR  Take 1 tablet by mouth once daily   SandoSTATIN  LAR [place] 20 MG injection Generic drug: octreotide  Inject 20 mg into the muscle every 28 (twenty-eight) days.        Follow-up Information     [facility]. Schedule an appointment as soon as possible for a visit in 10 day(s).   Contact information: [address] [place] [address] [phone]  Discharge Exam: Filed Weights   07/10/23 2337 07/11/23 1615 07/11/23 2005  Weight: 57.2 kg 57.4 kg 55.2 kg   General exam: Alert, awake, in no acute distress and feeling ready for discharge. Respiratory system: Clear to auscultation. Respiratory effort normal.  Good saturation on room air. Cardiovascular system: Rate controlled; no rubs or gallops. Gastrointestinal system: Abdomen is nondistended, soft and nontender. No organomegaly or masses felt. Normal bowel sounds heard. Central nervous system: Moving 4 limbs spontaneously.  No focal neurological  deficits. Extremities: No cyanosis or clubbing. Skin: No petechiae. Psychiatry: Mood & affect appropriate.    Condition at discharge: Stable.  The results of significant diagnostics from this hospitalization (including imaging, microbiology, ancillary and laboratory) are listed below for reference.   Imaging Studies: No results found.  Microbiology: Results for orders placed or performed during the hospital encounter of 05/31/23  Resp panel by RT-PCR (RSV, Flu A&B, Covid) Anterior Nasal Swab     Status: None   Collection Time: 05/31/23  5:41 PM   Specimen: Anterior Nasal Swab  Result Value Ref Range Status   SARS Coronavirus 2 by RT PCR NEGATIVE NEGATIVE Final    Comment: (NOTE) SARS-CoV-2 target nucleic acids are NOT DETECTED.  The SARS-CoV-2 RNA is generally detectable in upper respiratory specimens during the acute phase of infection. The lowest concentration of SARS-CoV-2 viral copies this assay can detect is 138 copies/mL. A negative result does not preclude SARS-Cov-2 infection and should not be used as the sole basis for treatment or other patient management decisions. A negative result may occur with  improper specimen collection/handling, submission of specimen other than nasopharyngeal swab, presence of viral mutation(s) within the areas targeted by this assay, and inadequate number of viral copies(<138 copies/mL). A negative result must be combined with clinical observations, patient history, and epidemiological information. The expected result is Negative.  Fact Sheet for Patients:  bloggercourse.com  Fact Sheet for Healthcare Providers:  seriousbroker.it  This test is no t yet approved or cleared by the [place]  FDA and  has been authorized for detection and/or diagnosis of SARS-CoV-2 by FDA under an Emergency Use Authorization (EUA). This EUA will remain  in effect (meaning this test can be used) for the  duration of the COVID-19 declaration under Section 564(b)(1) of the Act, 21 U.S.C.section 360bbb-3(b)(1), unless the authorization is terminated  or revoked sooner.       Influenza A by PCR NEGATIVE NEGATIVE Final   Influenza B by PCR NEGATIVE NEGATIVE Final    Comment: (NOTE) The Xpert Xpress SARS-CoV-2/FLU/RSV plus assay is intended as an aid in the diagnosis of influenza from Nasopharyngeal swab specimens and should not be used as a sole basis for treatment. Nasal washings and aspirates are unacceptable for Xpert Xpress SARS-CoV-2/FLU/RSV testing.  Fact Sheet for Patients: bloggercourse.com  Fact Sheet for Healthcare Providers: seriousbroker.it  This test is not yet approved or cleared by the [place]  FDA and has been authorized for detection and/or diagnosis of SARS-CoV-2 by FDA under an Emergency Use Authorization (EUA). This EUA will remain in effect (meaning this test can be used) for the duration of the COVID-19 declaration under Section 564(b)(1) of the Act, 21 U.S.C. section 360bbb-3(b)(1), unless the authorization is terminated or revoked.     Resp Syncytial Virus by PCR NEGATIVE NEGATIVE Final    Comment: (NOTE) Fact Sheet for Patients: bloggercourse.com  Fact Sheet for Healthcare Providers: seriousbroker.it  This test is not yet approved or cleared by the [place]  FDA and  has been authorized for detection and/or diagnosis of SARS-CoV-2 by FDA under an Emergency Use Authorization (EUA). This EUA will remain in effect (meaning this test can be used) for the duration of the COVID-19 declaration under Section 564(b)(1) of the Act, 21 U.S.C. section 360bbb-3(b)(1), unless the authorization is terminated or revoked.  Performed at [facility], [address] [place], [place] [address]    *Note: Due to a large number of results and/or encounters for the  requested time period, some results have not been displayed. A complete set of results can be found in Results Review.    Labs: CBC: Recent Labs  Lab 07/10/23 1514 07/11/23 1630 07/12/23 0844  WBC 4.2 3.9* 4.5  NEUTROABS 2.9  --   --   HGB 6.8* 5.9* 8.8*  HCT 21.0* 17.9* 26.1*  MCV 116.7* 115.5* 106.1*  PLT 202 203 188   Basic Metabolic Panel: Recent Labs  Lab 07/10/23 1514 07/11/23 1630  NA 133* 130*  K 3.6 3.5  CL 99 98  CO2 22 23  GLUCOSE 107* 136*  BUN 34* 44*  CREATININE 5.84* 6.65*  CALCIUM  8.9 8.2*  MG  --  1.5*  PHOS  --  3.9   Liver Function Tests: Recent Labs  Lab 07/11/23 1630  AST 17  ALT 13  ALKPHOS 76  BILITOT 1.0  PROT 5.8*  ALBUMIN  2.3*   CBG: Recent Labs  Lab 07/11/23 2046  GLUCAP 87    Discharge time spent: less than 30 minutes.  Signed: Eric Nunnery, MD [facility] 07/12/2023

## 2023-07-12 NOTE — Plan of Care (Signed)
 Rested well during the night.  Problem: Education: Goal: Knowledge of General Education information will improve Description: Including pain rating scale, medication(s)/side effects and non-pharmacologic comfort measures 07/12/2023 0119 by Durenda Calamity, RN Outcome: Progressing 07/12/2023 0119 by Durenda Calamity, RN Outcome: Progressing   Problem: Health Behavior/Discharge Planning: Goal: Ability to manage health-related needs will improve 07/12/2023 0119 by Durenda Calamity, RN Outcome: Progressing 07/12/2023 0119 by Durenda Calamity, RN Outcome: Progressing   Problem: Clinical Measurements: Goal: Ability to maintain clinical measurements within normal limits will improve 07/12/2023 0119 by Durenda Calamity, RN Outcome: Progressing 07/12/2023 0119 by Durenda Calamity, RN Outcome: Progressing Goal: Will remain free from infection 07/12/2023 0119 by Durenda Calamity, RN Outcome: Progressing 07/12/2023 0119 by Durenda Calamity, RN Outcome: Progressing Goal: Diagnostic test results will improve 07/12/2023 0119 by Durenda Calamity, RN Outcome: Progressing 07/12/2023 0119 by Durenda Calamity, RN Outcome: Progressing Goal: Respiratory complications will improve 07/12/2023 0119 by Durenda Calamity, RN Outcome: Progressing 07/12/2023 0119 by Durenda Calamity, RN Outcome: Progressing Goal: Cardiovascular complication will be avoided 07/12/2023 0119 by Durenda Calamity, RN Outcome: Progressing 07/12/2023 0119 by Durenda Calamity, RN Outcome: Progressing   Problem: Activity: Goal: Risk for activity intolerance will decrease 07/12/2023 0119 by Durenda Calamity, RN Outcome: Progressing 07/12/2023 0119 by Durenda Calamity, RN Outcome: Progressing   Problem: Nutrition: Goal: Adequate nutrition will be maintained 07/12/2023 0119 by Durenda Calamity, RN Outcome: Progressing 07/12/2023 0119 by Durenda Calamity, RN Outcome: Progressing   Problem: Coping: Goal: Level of anxiety will decrease 07/12/2023 0119 by  Durenda Calamity, RN Outcome: Progressing 07/12/2023 0119 by Durenda Calamity, RN Outcome: Progressing   Problem: Elimination: Goal: Will not experience complications related to bowel motility 07/12/2023 0119 by Durenda Calamity, RN Outcome: Progressing 07/12/2023 0119 by Durenda Calamity, RN Outcome: Progressing Goal: Will not experience complications related to urinary retention 07/12/2023 0119 by Durenda Calamity, RN Outcome: Progressing 07/12/2023 0119 by Durenda Calamity, RN Outcome: Progressing   Problem: Pain Management: Goal: General experience of comfort will improve 07/12/2023 0119 by Durenda Calamity, RN Outcome: Progressing 07/12/2023 0119 by Durenda Calamity, RN Outcome: Progressing   Problem: Safety: Goal: Ability to remain free from injury will improve 07/12/2023 0119 by Durenda Calamity, RN Outcome: Progressing 07/12/2023 0119 by Durenda Calamity, RN Outcome: Progressing   Problem: Skin Integrity: Goal: Risk for impaired skin integrity will decrease 07/12/2023 0119 by Durenda Calamity, RN Outcome: Progressing 07/12/2023 0119 by Durenda Calamity, RN Outcome: Progressing

## 2023-07-12 NOTE — Progress Notes (Signed)
   NEPHROLOGY NURSING NOTE:  [facility] worker, international aid Kendal Hymen) confirms pt can have dialysis there tomorrow, Thursday 1/2 at 1130.  She can then resume her regular schedule Friday 1/3.  The plan is acceptable, per Dr. Marisue Humble.  Arman Filter, RN

## 2023-07-12 NOTE — Discharge Instructions (Addendum)
 You will be getting hemodialysis tomorrow 07/12/22 at 11:30 (Thursday) and also 07/13/22 (Friday) at [facility].

## 2023-07-12 NOTE — Progress Notes (Signed)
 Patient brought back to the floor from dialysis. Husband at bedside.

## 2023-07-12 NOTE — Progress Notes (Signed)
 Admit: 07/10/2023 LOS: 0  9F ESRD MWF here with recurrent anemia and missed HD.   Subjective:  HD yesterday with 2 L UF using TDC Transfused yesterday, hemoglobin improved to 8.8 Confused this morning as usual, pleasant  12/31 0701 - 01/01 0700 In: 318 [Blood:318] Out: 2000   Filed Weights   07/10/23 2337 07/11/23 1615 07/11/23 2005  Weight: 57.2 kg 57.4 kg 55.2 kg    Scheduled Meds:  sodium chloride    Intravenous Once   amiodarone   100 mg Oral Daily   calcitRIOL   0.5 mcg Oral Q M,W,F-HD   Chlorhexidine  Gluconate Cloth  6 each Topical Q0600   cinacalcet   30 mg Oral Q M,W,F-HD   cyanocobalamin   1,000 mcg Intramuscular Q30 days   folic acid   1 mg Oral Daily   hydrALAZINE   25 mg Oral BID   levothyroxine   50 mcg Oral Q0600   rosuvastatin   10 mg Oral Daily   Continuous Infusions: PRN Meds:.alteplase , heparin , hydrALAZINE , ondansetron  **OR** ondansetron  (ZOFRAN ) IV  Current Labs: reviewed    Physical Exam:  Blood pressure (!) 138/50, pulse 60, temperature 98.1 F (36.7 C), temperature source Oral, resp. rate 18, height 5' 2 (1.575 m), weight 55.2 kg, SpO2 96%. GEN: NAD, sitting in chair eating breakfast, pleasant and interactive ENT: NCAT EYES: EOMI CV: Bradycardic, regular PULM: Clear bilaterally, normal work of breathing ABD: Soft, nontender SKIN: No rashes or lesions EXT:1+ LEE b/l.  A ESRD MWF [facility] TDC at [facility] Recurrent anemia history of recurrent GI bleeds, transfused during admission with improvement in hemoglobin; ESA as outpatient HTN/Vol: BPs stable, 1-2L UF with HD CKD-BMD: Continue outpatient regimen  P HD today to get back on schedule: 1-2L UF, using TDC Next HD tentative 07/13/2022 Medication Issues; Preferred narcotic agents for pain control are hydromorphone , fentanyl , and methadone. Morphine  should not be used.  Baclofen should be avoided Avoid oral sodium phosphate  and magnesium  citrate based laxatives / bowel preps    Bernardino Gasman  MD 07/12/2023, 11:26 AM  Recent Labs  Lab 07/10/23 1514 07/11/23 1630  NA 133* 130*  K 3.6 3.5  CL 99 98  CO2 22 23  GLUCOSE 107* 136*  BUN 34* 44*  CREATININE 5.84* 6.65*  CALCIUM  8.9 8.2*  PHOS  --  3.9   Recent Labs  Lab 07/10/23 1514 07/11/23 1630 07/12/23 0844  WBC 4.2 3.9* 4.5  NEUTROABS 2.9  --   --   HGB 6.8* 5.9* 8.8*  HCT 21.0* 17.9* 26.1*  MCV 116.7* 115.5* 106.1*  PLT 202 203 188

## 2023-07-12 NOTE — Progress Notes (Signed)
 Physical Therapy Treatment Patient Details Name: Shawna [name] MRN: [id number] DOB: [birth year] Today's Date: 07/12/2023   History of Present Illness Shawna [name] is a 83 y.o. female with medical history significant for ESRD on hemodialysis (MWF), recurrent GI bleeds possibly due to AVMs, paroxysmal atrial fibrillation no longer on anticoagulation, CAD status post CABG and PCI who presents to the emergency department.  Patient states that she was called today by outpatient center that her hemoglobin was in the 3's and patient was unable to undergo dialysis, she was asked to go to the ED for further evaluation and management.  Patient's hemoglobin about 4 weeks ago was 8.1, she has been complaining of about 1 month of increased generalized weakness.  She denies any known blood loss or black stools.  She was last dialyzed about 3 days ago.    PT Comments  Patient stated fatigue limiting ability to participate but was agreeable to seated exercises at EOB. She demonstrated good sitting balance and sitting tolerance  while completing exercise. Patient returned to supine at end of session. Patient will benefit from continued skilled physical therapy in hospital and recommended venue below to increase strength, balance, endurance for safe ADLs and gait.     If plan is discharge home, recommend the following: A little help with walking and/or transfers;A little help with bathing/dressing/bathroom;Help with stairs or ramp for entrance;Assistance with cooking/housework   Can travel by private vehicle        Equipment Recommendations  None recommended by PT    Recommendations for Other Services       Precautions / Restrictions Precautions Precautions: Fall Restrictions Weight Bearing Restrictions Per Provider Order: No     Mobility  Bed Mobility Overal bed mobility: Needs Assistance Bed Mobility: Supine to Sit, Sit to Supine     Supine to sit: Supervision Sit to supine: Contact guard assist    General bed mobility comments: increased time, labored movement    Transfers                        Ambulation/Gait                   Stairs             Wheelchair Mobility     Tilt Bed    Modified Rankin (Stroke Patients Only)       Balance Overall balance assessment: Needs assistance Sitting-balance support: No upper extremity supported, Feet supported Sitting balance-Leahy Scale: Good Sitting balance - Comments: seated at EOB                                    Cognition Arousal: Alert Behavior During Therapy: WFL for tasks assessed/performed Overall Cognitive Status: Within Functional Limits for tasks assessed                                          Exercises General Exercises - Lower Extremity Long Arc Quad: AROM, Both, 20 reps, Seated Hip Flexion/Marching: AROM, Both, 20 reps, Seated Toe Raises: AROM, Both, 20 reps, Seated Heel Raises: AROM, Both, 20 reps, Seated    General Comments        Pertinent Vitals/Pain Pain Assessment Pain Assessment: No/denies pain    Home Living  Prior Function            PT Goals (current goals can now be found in the care plan section) Acute Rehab PT Goals Patient Stated Goal: return home with family to assist PT Goal Formulation: With patient Time For Goal Achievement: 07/14/23 Potential to Achieve Goals: Good Progress towards PT goals: Progressing toward goals    Frequency    Min 3X/week      PT Plan      Co-evaluation              AM-PAC PT 6 Clicks Mobility   Outcome Measure  Help needed turning from your back to your side while in a flat bed without using bedrails?: None Help needed moving from lying on your back to sitting on the side of a flat bed without using bedrails?: A Little Help needed moving to and from a bed to a chair (including a wheelchair)?: A Little Help needed standing up from a  chair using your arms (e.g., wheelchair or bedside chair)?: A Little Help needed to walk in hospital room?: A Little Help needed climbing 3-5 steps with a railing? : A Lot 6 Click Score: 18    End of Session   Activity Tolerance: Patient tolerated treatment well;Patient limited by fatigue Patient left: with call bell/phone within reach;in bed;with bed alarm set Nurse Communication: Mobility status PT Visit Diagnosis: Unsteadiness on feet (R26.81);Other abnormalities of gait and mobility (R26.89);Muscle weakness (generalized) (M62.81)     Time: 8942-8881 PT Time Calculation (min) (ACUTE ONLY): 21 min  Charges:    $Therapeutic Exercise: 8-22 mins PT General Charges $$ ACUTE PT VISIT: 1 Visit                     1:01 PM, 07/12/23 Prentice CANDIE Stains PT, DPT Physical Therapist at [facility]

## 2023-07-12 NOTE — TOC Transition Note (Addendum)
 Transition of Care [facility]) - Discharge Note   Patient Details  Name: Shawna [name] MRN: [id number] Date of Birth: 1941-02-28  [facility]) CM/SW Contact:  Mcarthur Saddie Kim, LCSW Phone Number: 07/12/2023, 2:53 PM   Clinical Narrative: Anticipate possible d/c later today. LCSW faxed home health orders to [facility]. LCSW unable to reach office due to holiday. Pt's husband will call [facility] tomorrow to make sure they are aware pt is d/c from hospital.        Final next level of care: Home w Home Health Services Barriers to Discharge: Barriers Resolved   Patient Goals and CMS Choice Patient states their goals for this hospitalization and ongoing recovery are:: return home CMS Medicare.gov Compare Post Acute Care list provided to:: Patient Represenative (must comment) Choice offered to / list presented to : Spouse      Discharge Placement                       Discharge Plan and Services Additional resources added to the After Visit Summary for   In-house Referral: Clinical Social Work Discharge Planning Services: CM Consult Post Acute Care Choice: Home Health                    HH Arranged: RN, PT, OT, Nurse's Aide HH Agency: Other - See comment Risk Manager) Date HH Agency Contacted: 07/11/23   Representative spoke with at [facility] Agency: [place]  Social Drivers of Health (SDOH) Interventions SDOH Screenings   Food Insecurity: No Food Insecurity (07/10/2023)  Housing: Low Risk  (07/10/2023)  Transportation Needs: No Transportation Needs (07/10/2023)  Utilities: Not At Risk (07/10/2023)  Financial Resource Strain: Low Risk  (12/14/2018)  Physical Activity: Unknown (12/14/2018)  Social Connections: Patient Declined (07/11/2023)  Stress: No Stress Concern Present (12/14/2018)  Tobacco Use: Low Risk  (07/10/2023)  Health Literacy: Inadequate Health Literacy (06/27/2023)   Received from [facility]     Readmission Risk Interventions     05/02/2023    2:17 PM 01/29/2023   11:08 AM 10/31/2022   12:45 PM  Readmission Risk Prevention Plan  Transportation Screening Complete Complete Complete  Medication Review Oceanographer) Complete Complete Complete  PCP or Specialist appointment within 3-5 days of discharge  Complete   HRI or Home Care Consult Complete Complete Complete  SW Recovery Care/Counseling Consult Complete Complete Complete  Palliative Care Screening Not Applicable Not Applicable Not Applicable  Skilled Nursing Facility Not Applicable Not Applicable Not Applicable

## 2023-07-13 LAB — HEPATITIS B SURFACE ANTIBODY, QUANTITATIVE: Hep B S AB Quant (Post): 143 m[IU]/mL

## 2023-08-04 ENCOUNTER — Other Ambulatory Visit (HOSPITAL_COMMUNITY): Payer: Self-pay

## 2023-08-05 ENCOUNTER — Emergency Department (HOSPITAL_COMMUNITY): Payer: Medicare Other

## 2023-08-05 ENCOUNTER — Other Ambulatory Visit: Payer: Self-pay

## 2023-08-05 ENCOUNTER — Observation Stay (HOSPITAL_COMMUNITY)
Admission: EM | Admit: 2023-08-05 | Discharge: 2023-08-08 | Disposition: A | Discharge status: Home Health | Payer: Medicare Other | Attending: Family Medicine | Admitting: Family Medicine

## 2023-08-05 DIAGNOSIS — E1122 Type 2 diabetes mellitus with diabetic chronic kidney disease: Secondary | ICD-10-CM | POA: Diagnosis not present

## 2023-08-05 DIAGNOSIS — Z8673 Personal history of transient ischemic attack (TIA), and cerebral infarction without residual deficits: Secondary | ICD-10-CM | POA: Diagnosis not present

## 2023-08-05 DIAGNOSIS — Z992 Dependence on renal dialysis: Secondary | ICD-10-CM

## 2023-08-05 DIAGNOSIS — N186 End stage renal disease: Secondary | ICD-10-CM | POA: Diagnosis not present

## 2023-08-05 DIAGNOSIS — R001 Bradycardia, unspecified: Secondary | ICD-10-CM | POA: Diagnosis not present

## 2023-08-05 DIAGNOSIS — I132 Hypertensive heart and chronic kidney disease with heart failure and with stage 5 chronic kidney disease, or end stage renal disease: Secondary | ICD-10-CM | POA: Insufficient documentation

## 2023-08-05 DIAGNOSIS — I482 Chronic atrial fibrillation, unspecified: Secondary | ICD-10-CM | POA: Diagnosis not present

## 2023-08-05 DIAGNOSIS — E119 Type 2 diabetes mellitus without complications: Secondary | ICD-10-CM

## 2023-08-05 DIAGNOSIS — Z79899 Other long term (current) drug therapy: Secondary | ICD-10-CM | POA: Insufficient documentation

## 2023-08-05 DIAGNOSIS — Z951 Presence of aortocoronary bypass graft: Secondary | ICD-10-CM | POA: Insufficient documentation

## 2023-08-05 DIAGNOSIS — Z85038 Personal history of other malignant neoplasm of large intestine: Secondary | ICD-10-CM | POA: Diagnosis not present

## 2023-08-05 DIAGNOSIS — Z955 Presence of coronary angioplasty implant and graft: Secondary | ICD-10-CM | POA: Insufficient documentation

## 2023-08-05 DIAGNOSIS — M6281 Muscle weakness (generalized): Secondary | ICD-10-CM | POA: Diagnosis not present

## 2023-08-05 DIAGNOSIS — I2510 Atherosclerotic heart disease of native coronary artery without angina pectoris: Secondary | ICD-10-CM | POA: Diagnosis present

## 2023-08-05 DIAGNOSIS — R2689 Other abnormalities of gait and mobility: Secondary | ICD-10-CM | POA: Insufficient documentation

## 2023-08-05 DIAGNOSIS — E039 Hypothyroidism, unspecified: Secondary | ICD-10-CM | POA: Diagnosis not present

## 2023-08-05 DIAGNOSIS — Z8543 Personal history of malignant neoplasm of ovary: Secondary | ICD-10-CM | POA: Diagnosis not present

## 2023-08-05 DIAGNOSIS — I5042 Chronic combined systolic (congestive) and diastolic (congestive) heart failure: Secondary | ICD-10-CM | POA: Diagnosis present

## 2023-08-05 LAB — BASIC METABOLIC PANEL
Anion gap: 14 (ref 5–15)
BUN: 29 mg/dL — ABNORMAL HIGH (ref 8–23)
CO2: 24 mmol/L (ref 22–32)
Calcium: 8.5 mg/dL — ABNORMAL LOW (ref 8.9–10.3)
Chloride: 97 mmol/L — ABNORMAL LOW (ref 98–111)
Creatinine, Ser: 4.33 mg/dL — ABNORMAL HIGH (ref 0.44–1.00)
GFR, Estimated: 10 mL/min — ABNORMAL LOW (ref >60)
Glucose, Bld: 104 mg/dL — ABNORMAL HIGH (ref 70–99)
Potassium: 3.6 mmol/L (ref 3.5–5.1)
Sodium: 135 mmol/L (ref 135–145)

## 2023-08-05 LAB — TROPONIN I (HIGH SENSITIVITY)
Troponin I (High Sensitivity): 40 ng/L — ABNORMAL HIGH (ref <18)
Troponin I (High Sensitivity): 37 ng/L — ABNORMAL HIGH (ref <18)

## 2023-08-05 LAB — TSH: TSH: 9.781 u[IU]/mL — ABNORMAL HIGH (ref 0.350–4.500)

## 2023-08-05 LAB — CBC
HCT: 22.9 % — ABNORMAL LOW (ref 36.0–46.0)
Hemoglobin: 7.5 g/dL — ABNORMAL LOW (ref 12.0–15.0)
MCH: 37.9 pg — ABNORMAL HIGH (ref 26.0–34.0)
MCHC: 32.8 g/dL (ref 30.0–36.0)
MCV: 115.7 fL — ABNORMAL HIGH (ref 80.0–100.0)
Platelets: 162 10*3/uL (ref 150–400)
RBC: 1.98 MIL/uL — ABNORMAL LOW (ref 3.87–5.11)
WBC: 4.2 10*3/uL (ref 4.0–10.5)
nRBC: 0 % (ref 0.0–0.2)

## 2023-08-05 LAB — T4, FREE: Free T4: 1.03 ng/dL (ref 0.61–1.12)

## 2023-08-05 MED ORDER — PANTOPRAZOLE SODIUM 40 MG PO TBEC
40.0000 mg | DELAYED_RELEASE_TABLET | Freq: Every day | ORAL | Status: DC
  Administered 2023-08-06 – 2023-08-08 (×3): 40 mg via ORAL
  Filled 2023-08-05 (×3): qty 1

## 2023-08-05 MED ORDER — ONDANSETRON HCL 4 MG PO TABS: 4.0000 mg | ORAL_TABLET | Freq: Four times a day (QID) | ORAL | Status: DC | PRN

## 2023-08-05 MED ORDER — HEPARIN SODIUM (PORCINE) 5000 UNIT/ML IJ SOLN
5000.0000 [IU] | Freq: Three times a day (TID) | INTRAMUSCULAR | Status: DC
  Administered 2023-08-06 – 2023-08-08 (×5): 5000 [IU] via SUBCUTANEOUS
  Filled 2023-08-05 (×6): qty 1

## 2023-08-05 MED ORDER — CINACALCET HCL 30 MG PO TABS
30.0000 mg | ORAL_TABLET | ORAL | Status: DC
  Administered 2023-08-07: 30 mg via ORAL
  Filled 2023-08-05: qty 1

## 2023-08-05 MED ORDER — CALCITRIOL 0.5 MCG PO CAPS
1.7500 ug | ORAL_CAPSULE | ORAL | Status: DC
  Administered 2023-08-07: 1.75 ug via ORAL
  Filled 2023-08-05 (×2): qty 1

## 2023-08-05 MED ORDER — LEVOTHYROXINE SODIUM 75 MCG PO TABS: 75.0000 ug | ORAL_TABLET | Freq: Every day | ORAL | Status: DC

## 2023-08-05 MED ORDER — ONDANSETRON HCL 4 MG/2ML IJ SOLN
4.0000 mg | Freq: Four times a day (QID) | INTRAMUSCULAR | Status: DC | PRN
  Administered 2023-08-07: 4 mg via INTRAVENOUS
  Filled 2023-08-05: qty 2

## 2023-08-05 MED ORDER — ROSUVASTATIN CALCIUM 5 MG PO TABS
10.0000 mg | ORAL_TABLET | Freq: Every day | ORAL | Status: DC
  Administered 2023-08-06 – 2023-08-08 (×3): 10 mg via ORAL
  Filled 2023-08-05 (×3): qty 2

## 2023-08-05 MED ORDER — GABAPENTIN 100 MG PO CAPS
100.0000 mg | ORAL_CAPSULE | Freq: Every evening | ORAL | Status: DC
  Administered 2023-08-06 – 2023-08-07 (×2): 100 mg via ORAL
  Filled 2023-08-05 (×2): qty 1

## 2023-08-05 MED ORDER — CYCLOBENZAPRINE HCL 5 MG PO TABS: 5.0000 mg | ORAL_TABLET | Freq: Three times a day (TID) | ORAL | Status: DC | PRN

## 2023-08-05 MED ORDER — LEVETIRACETAM 500 MG PO TABS
500.0000 mg | ORAL_TABLET | Freq: Every day | ORAL | Status: DC
  Administered 2023-08-06 – 2023-08-08 (×3): 500 mg via ORAL
  Filled 2023-08-05 (×3): qty 1

## 2023-08-05 MED ORDER — LEVOTHYROXINE SODIUM 25 MCG PO TABS
125.0000 ug | ORAL_TABLET | Freq: Every day | ORAL | Status: DC
  Administered 2023-08-06 – 2023-08-08 (×3): 125 ug via ORAL
  Filled 2023-08-05 (×3): qty 1

## 2023-08-05 NOTE — ED Provider Notes (Signed)
[place] EMERGENCY DEPARTMENT AT [facility] Provider Note   CSN: [id number] Arrival date & time: 08/05/23  1325     History {Add pertinent medical, surgical, social history, OB history to HPI:1} Chief Complaint  Patient presents with   Bradycardia    Shawna [name] is a 83 y.o. female.  This is an 83 year old female with ESRD presenting emergency department for bradycardia.  Was at dialysis received roughly half a session when she was noted to have low heart rate.  Asymptomatic, was sent to the emergency department for further evaluation.  Patient denies lightheadedness, dizziness, chest pain, shortness of breath states she feels her usual state.  Denies taking medications that would slow her heart.        Home Medications Prior to Admission medications   Medication Sig Start Date End Date Taking? Authorizing Provider  amiodarone (PACERONE) 200 MG tablet Take 200 mg by mouth daily. 05/31/23   [provider]  calcitRIOL (ROCALTROL) 0.25 MCG capsule Take 7 capsules (1.75 mcg total) by mouth every Monday, Wednesday, and Friday with hemodialysis. 12/21/22   Lanae Boast, MD  cinacalcet (SENSIPAR) 30 MG tablet Take 1 tablet (30 mg total) by mouth every Monday, Wednesday, and Friday with hemodialysis. 12/19/22   Lanae Boast, MD  cyanocobalamin (VITAMIN B12) 1000 MCG/ML injection Inject 1,000 mcg into the muscle every 30 (thirty) days.    [provider]  cyclobenzaprine (FLEXERIL) 5 MG tablet Take 5 mg by mouth 3 (three) times daily as needed for muscle spasms.    [provider]  folic acid (FOLVITE) 1 MG tablet Take 1 mg by mouth daily. 05/24/22   [provider]  gabapentin (NEURONTIN) 100 MG capsule Take 1 capsule (100 mg total) by mouth every evening. 06/05/23 07/05/23  Dorcas Carrow, MD  hydrALAZINE (APRESOLINE) 25 MG tablet Take 1 tablet (25 mg total) by mouth 2 (two) times daily. Hold for sbp <110 12/27/22   Lanae Boast, MD  hydrOXYzine  (ATARAX) 25 MG tablet Take 25 mg by mouth every 6 (six) hours as needed for itching. 06/06/22   [provider]  levETIRAcetam (KEPPRA) 500 MG tablet Take 1 tablet (500 mg total) by mouth daily. 11/15/22   Leroy Sea, MD  levothyroxine (SYNTHROID) 75 MCG tablet Take 1 tablet (75 mcg total) by mouth daily at 6 (six) AM. 02/02/23   Sherryll Burger, Pratik D, DO  metoprolol tartrate (LOPRESSOR) 25 MG tablet Take 0.5 tablets (12.5 mg total) by mouth 2 (two) times daily. 02/01/23 06/01/23  Sherryll Burger, Pratik D, DO  multivitamin (RENA-VIT) TABS tablet Take 1 tablet by mouth daily.    [provider]  nitroGLYCERIN (NITROSTAT) 0.4 MG SL tablet Place 0.4 mg under the tongue every 5 (five) minutes as needed for chest pain.    [provider]  octreotide (SANDOSTATIN LAR) 20 MG injection Inject 20 mg into the muscle every 28 (twenty-eight) days. 11/15/22   Leroy Sea, MD  omeprazole (PRILOSEC) 40 MG capsule Take 40 mg by mouth daily. 05/31/23   [provider]  ondansetron (ZOFRAN-ODT) 4 MG disintegrating tablet Take 4 mg by mouth every 8 (eight) hours as needed for nausea or vomiting. 05/22/23   [provider]  rosuvastatin (CRESTOR) 10 MG tablet Take 1 tablet by mouth once daily 06/06/22   [place] Ouch, [place] M, PA-C      Allergies    Amlodipine, Aspirin, Nitrofurantoin, Ranexa [ranolazine], Bactrim [sulfamethoxazole-trimethoprim], Iodinated contrast media, Iron, Tylenol [acetaminophen], Gabapentin, Sucroferric oxyhydroxide, Levaquin [levofloxacin in  d5w], Levofloxacin, Plavix [clopidogrel bisulfate], Protonix [pantoprazole sodium], and Venofer [ferric oxide]    Review of Systems   Review of Systems  Physical Exam Updated Vital Signs BP (!) 142/48   Pulse (!) 38   Temp 97.8 F (36.6 C) (Oral)   Resp (!) 22   Ht 5' (1.524 m)   Wt 54.4 kg   SpO2 100%   BMI 23.44 kg/m  Physical Exam Vitals and nursing note reviewed.  Constitutional:      General: She is not in  acute distress.    Appearance: She is not toxic-appearing.  HENT:     Head: Normocephalic.     Nose: Nose normal.     Mouth/Throat:     Mouth: Mucous membranes are moist.  Eyes:     Conjunctiva/sclera: Conjunctivae normal.  Cardiovascular:     Rate and Rhythm: Regular rhythm. Bradycardia present.  Pulmonary:     Effort: Pulmonary effort is normal.     Breath sounds: Normal breath sounds.  Abdominal:     General: Abdomen is flat. There is no distension.     Palpations: Abdomen is soft.     Tenderness: There is no abdominal tenderness. There is no guarding or rebound.  Musculoskeletal:     Right lower leg: Edema present.     Left lower leg: Edema present.  Skin:    General: Skin is warm.     Capillary Refill: Capillary refill takes less than 2 seconds.  Neurological:     General: No focal deficit present.     Mental Status: She is alert and oriented to person, place, and time.  Psychiatric:        Mood and Affect: Mood normal.        Behavior: Behavior normal.     ED Results / Procedures / Treatments   Labs (all labs ordered are listed, but only abnormal results are displayed) Labs Reviewed  CBC - Abnormal; Notable for the following components:      Result Value   RBC 1.98 (*)    Hemoglobin 7.5 (*)    HCT 22.9 (*)    MCV 115.7 (*)    MCH 37.9 (*)    All other components within normal limits  BASIC METABOLIC PANEL  TSH  T4, FREE  TROPONIN I (HIGH SENSITIVITY)  TROPONIN I (HIGH SENSITIVITY)    EKG EKG Interpretation Date/Time:  Saturday August 05 2023 13:32:55 EST Ventricular Rate:  39 PR Interval:  200 QRS Duration:  138 QT Interval:  590 QTC Calculation: 476 R Axis:   14  Text Interpretation: Sinus bradycardia Left bundle branch block Confirmed by Estanislado Pandy [id number]) on 08/05/2023 1:43:56 PM  Radiology DG Chest Portable 1 View Result Date: 08/05/2023 CLINICAL DATA:  Fluid overload EXAM: PORTABLE CHEST 1 VIEW COMPARISON:  Chest radiograph dated  05/31/2023 FINDINGS: Lines/tubes: Left internal jugular venous catheter tip projects over the right atrium. Left axillary stent graft. Lungs: Low lung volumes. Diffuse bilateral interstitial opacities. Bibasilar patchy opacities. Pleura: Small bilateral pleural effusions.  No pneumothorax. Heart/mediastinum: Similar enlarged cardiomediastinal silhouette. Bones: Median sternotomy wires are nondisplaced. Degenerative changes of the right shoulder. IMPRESSION: 1. Cardiomegaly with pulmonary edema and small bilateral pleural effusions. 2. Bibasilar patchy opacities, likely atelectasis. Aspiration or pneumonia can be considered in the appropriate clinical setting. Electronically Signed   By: Agustin Cree M.D.   On: 08/05/2023 14:32    Procedures Procedures  {Document cardiac monitor, telemetry assessment procedure when appropriate:1}  Medications Ordered in ED Medications -  No data to display  ED Course/ Medical Decision Making/ A&P Clinical Course as of 08/05/23 1529  Sat Aug 05, 2023  1355 Has prior EKGs with bradycardia with rates in the 40s to 50s.  She is sinus bradycardia on the monitor.  Blood pressure 140s systolic.  She has no complaints currently.  Appears to take amiodarone and metoprolol per chart review  [TY]    Clinical Course User Index [TY] Coral Spikes, DO   {   Click here for ABCD2, HEART and other calculatorsREFRESH Note before signing :1}                              Medical Decision Making Is a well-appearing 83 year old female presenting emergency department for bradycardia after dialysis.  She is afebrile, heart rate in the upper 30s low 40s on the monitor.  Appears to be sinus.  EKG without ST segment changes to indicate ischemia, no advanced block.  Normal intervals.  Per chart review does take amiodarone and metoprolol.  Heart rate typically is on the slower side as she does have prior EKGs with heart rates in the 50 and per chart review from prior admission last month for  symptomatic anemia she had heart rates in the mid 40s documented for her vitals.  Patient largely asymptomatic, will get basic screening labs.  See ED course for further MDM/disposition.   Amount and/or Complexity of Data Reviewed Labs: ordered. Radiology: ordered.    {Document critical care time when appropriate:1} {Document review of labs and clinical decision tools ie heart score, Chads2Vasc2 etc:1}  {Document your independent review of radiology images, and any outside records:1} {Document your discussion with family members, caretakers, and with consultants:1} {Document social determinants of health affecting pt's care:1} {Document your decision making why or why not admission, treatments were needed:1} Final Clinical Impression(s) / ED Diagnoses Final diagnoses:  None    Rx / DC Orders ED Discharge Orders     None

## 2023-08-05 NOTE — H&P (Signed)
History and Physical    Patient: Shawna [name] ZOX:[id number] DOB: 08/06/1940 DOA: 08/05/2023 DOS: the patient was seen and examined on 08/05/2023 PCP: [facility]  Patient coming from: Home  Chief Complaint:  Chief Complaint  Patient presents with   Bradycardia   HPI: Shawna [name] is a 83 y.o. female with medical history significant of end-stage renal disease on dialysis, coronary artery disease status post CABG and drug-eluting stents, heart failure with reduced EF, diabetes, hypertension, GERD, atrial fibrillation not on anticoagulants.  Patient missed dialysis yesterday due to loose stools.  She presented to dialysis today for make-up session, however after an hour of dialysis she was noted to be bradycardic into the mid 30s.  Her dialysis was stopped and she was directed to come to the hospital for evaluation.  Here, she endorses some fatigue that is worse with exertion.  She does not noticed any other symptoms.  She has not noticed any bradycardia.  Her appetite has been normal.  No fevers, chills, nausea, vomiting, diarrhea.  Review of Systems: As mentioned in the history of present illness. All other systems reviewed and are negative. Past Medical History:  Diagnosis Date   Acute on chronic respiratory failure with hypoxia (HCC) 10/10/2016   Anxiety    Arthritis    AVM (arteriovenous malformation) of colon    CAD (coronary artery disease)    a. s/p CABG in 2013 b. DES to D1 in 10/2016. c. cath in 07/2018 showing patent grafts with occlusion of D1 at prior stent site and progression of PDA disease --> medical management recommended   Carotid artery disease (HCC)    a. 60-79% LICA, 03/2012    Chronic bronchitis (HCC)    Chronic HFrEF (heart failure with reduced ejection fraction) (HCC)    Colon cancer (HCC) 1992   Esophageal stricture    ESRD on hemodialysis (HCC)    ESRD due to HTN, started dialysis 2011 and gets HD at [facility] with Dr Fausto Skillern on MWF schedule.   Access is LUA AVF as of Sept 2014.    GERD (gastroesophageal reflux disease)    High cholesterol 12/2011   History of blood transfusion 07/2011; 12/2011; 01/2012 X 2; 04/2012   History of gout    History of lower GI bleeding    Hypertension    Iron deficiency anemia    Jugular vein occlusion, right (HCC)    Mitral regurgitation    a. Moderate by echo, 02/2012   Mitral valve disease    NSVT (nonsustained ventricular tachycardia) (HCC)    Ovarian cancer (HCC) 1992   PAF (paroxysmal atrial fibrillation) (HCC)    Pneumonia ~ 2009   PUD (peptic ulcer disease)    TIA (transient ischemic attack)    Tricuspid valve disease    Past Surgical History:  Procedure Laterality Date   A/V FISTULAGRAM Left 10/04/2022   Procedure: A/V Fistulagram;  Surgeon: Renford Dills, MD;  Location: [facility] INVASIVE CV LAB;  Service: Cardiovascular;  Laterality: Left;   A/V SHUNTOGRAM Left 03/19/2019   Procedure: A/V SHUNTOGRAM;  Surgeon: Renford Dills, MD;  Location: [facility] INVASIVE CV LAB;  Service: Cardiovascular;  Laterality: Left;   ABDOMINAL HYSTERECTOMY  1992   APPENDECTOMY  06/1990   AV FISTULA PLACEMENT  07/2009   left upper arm   AV FISTULA PLACEMENT Right 09/06/2016   Procedure: RIGHT FOREARM ARTERIOVENOUS (AV) GRAFT;  Surgeon: Sherren Kerns, MD;  Location: [facility] OR;  Service: Vascular;  Laterality: Right;  AV FISTULA PLACEMENT N/A 02/24/2017   Procedure: INSERTION OF ARTERIOVENOUS (AV) GORE-TEX GRAFT ARM (BRACHIAL AXILLARY);  Surgeon: Renford Dills, MD;  Location: [facility] ORS;  Service: Vascular;  Laterality: N/A;   AVGG REMOVAL Right 09/06/2016   Procedure: REMOVAL OF Right Arm ARTERIOVENOUS GORETEX GRAFT and Vein Patch angioplasty of brachial artery;  Surgeon: Chuck Hint, MD;  Location: [facility] OR;  Service: Vascular;  Laterality: Right;   BIOPSY  09/26/2019   Procedure: BIOPSY;  Surgeon: Malissa Hippo, MD;  [facility];  Service: Endoscopy;;   BIOPSY  11/01/2022   Procedure:  BIOPSY;  Surgeon: Dolores Frame, MD;  [facility];  Service: Gastroenterology;;   COLON RESECTION  1992   COLON SURGERY     COLONOSCOPY N/A 03/09/2019   Procedure: COLONOSCOPY;  Surgeon: Malissa Hippo, MD;  [facility];  Service: Endoscopy;  Laterality: N/A;   COLONOSCOPY N/A 08/11/2022   Procedure: COLONOSCOPY;  Surgeon: Beverley Fiedler, MD;  Location: [facility] ENDOSCOPY;  Service: Gastroenterology;  Laterality: N/A;   COLONOSCOPY WITH PROPOFOL N/A 06/08/2021   Procedure: COLONOSCOPY WITH PROPOFOL;  Surgeon: Dolores Frame, MD;  [facility];  Service: Gastroenterology;  Laterality: N/A;  9:05 /Patient is on dialysis Mon Wed Fri   CORONARY ANGIOPLASTY WITH STENT PLACEMENT  12/15/11   "2"   CORONARY ANGIOPLASTY WITH STENT PLACEMENT  y/2013   "1; makes total of 3" (05/02/2012)   CORONARY ARTERY BYPASS GRAFT  06/13/2012   Procedure: CORONARY ARTERY BYPASS GRAFTING (CABG);  Surgeon: Delight Ovens, MD;  Location: [facility] OR;  Service: Open Heart Surgery;  Laterality: N/A;  cabg x four;  using left internal mammary artery, and left leg greater saphenous vein harvested endoscopically   CORONARY STENT INTERVENTION N/A 10/13/2016   Procedure: Coronary Stent Intervention;  Surgeon: Lennette Bihari, MD;  [facility]: [facility] IN[facility] CV LAB;  Service: Cardiovascular;  Laterality: N/A;   DIALYSIS/PERMA CATHETER REMOVAL N/A 04/18/2017   Procedure: DIALYSIS/PERMA CATHETER REMOVAL;  Surgeon: Renford Dills, MD;  Location: [facility] INVASIVE CV LAB;  Service: Cardiovascular;  Laterality: N/A;   DILATION AND CURETTAGE OF UTERUS     ENTEROSCOPY N/A 06/08/2021   Procedure: PUSH ENTEROSCOPY;  Surgeon: Dolores Frame, MD;  [facility];  Service: Gastroenterology;  Laterality: N/A;   ENTEROSCOPY N/A 11/01/2022   Procedure: ENTEROSCOPY;  Surgeon: Dolores Frame, MD;  [facility];  Service: Gastroenterology;  Laterality: N/A;    ENTEROSCOPY N/A 12/01/2022   Procedure: ENTEROSCOPY;  Surgeon: Benancio Deeds, MD;  Location: [facility] ENDOSCOPY;  Service: Gastroenterology;  Laterality: N/A;   ESOPHAGOGASTRODUODENOSCOPY  01/20/2012   Procedure: ESOPHAGOGASTRODUODENOSCOPY (EGD);  Surgeon: Meryl Dare, MD,FACG;  Location: [facility] ENDOSCOPY;  Service: Endoscopy;  Laterality: N/A;   ESOPHAGOGASTRODUODENOSCOPY N/A 03/26/2013   Procedure: ESOPHAGOGASTRODUODENOSCOPY (EGD);  Surgeon: Hilarie Fredrickson, MD;  Location: [facility] ENDOSCOPY;  Service: Endoscopy;  Laterality: N/A;   ESOPHAGOGASTRODUODENOSCOPY N/A 04/30/2015   Procedure: ESOPHAGOGASTRODUODENOSCOPY (EGD);  Surgeon: Malissa Hippo, MD;  [facility];  Service: Endoscopy;  Laterality: N/A;  1pm - moved to 10/20 @ 1:10   ESOPHAGOGASTRODUODENOSCOPY N/A 07/29/2016   Procedure: ESOPHAGOGASTRODUODENOSCOPY (EGD);  Surgeon: Ruffin Frederick, MD;  Location: [facility] ENDOSCOPY;  Service: Gastroenterology;  Laterality: N/A;  enteroscopy   ESOPHAGOGASTRODUODENOSCOPY N/A 09/26/2019   Procedure: ESOPHAGOGASTRODUODENOSCOPY (EGD);  Surgeon: Malissa Hippo, MD;  [facility];  Service: Endoscopy;  Laterality: N/A;  1250   ESOPHAGOGASTRODUODENOSCOPY N/A  08/11/2022   Procedure: ESOPHAGOGASTRODUODENOSCOPY (EGD);  Surgeon: Beverley Fiedler, MD;  Location: [facility] ENDOSCOPY;  Service: Gastroenterology;  Laterality: N/A;   ESOPHAGOGASTRODUODENOSCOPY (EGD) WITH PROPOFOL N/A 02/05/2021   Procedure: ESOPHAGOGASTRODUODENOSCOPY (EGD) WITH PROPOFOL;  Surgeon: Lanelle Bal, DO;  [facility];  Service: Endoscopy;  Laterality: N/A;   ESOPHAGOGASTRODUODENOSCOPY (EGD) WITH PROPOFOL N/A 08/27/2022   Procedure: ESOPHAGOGASTRODUODENOSCOPY (EGD) WITH PROPOFOL;  Surgeon: Lanelle Bal, DO;  [facility];  Service: Endoscopy;  Laterality: N/A;   GIVENS CAPSULE STUDY N/A 03/07/2019   Procedure: GIVENS CAPSULE STUDY;  Surgeon: Malissa Hippo, MD;  [facility];  Service:  Endoscopy;  Laterality: N/A;  7:30   GIVENS CAPSULE STUDY N/A 04/22/2021   Procedure: GIVENS CAPSULE STUDY;  Surgeon: Malissa Hippo, MD;  [facility];  Service: Endoscopy;  Laterality: N/A;  7:30   GIVENS CAPSULE STUDY N/A 08/27/2022   Procedure: GIVENS CAPSULE STUDY;  Surgeon: Lanelle Bal, DO;  [facility];  Service: Endoscopy;  Laterality: N/A;   HOT HEMOSTASIS N/A 08/11/2022   Procedure: HOT HEMOSTASIS (ARGON PLASMA COAGULATION/BICAP);  Surgeon: Beverley Fiedler, MD;  Location: [facility] ENDOSCOPY;  Service: Gastroenterology;  Laterality: N/A;   HOT HEMOSTASIS N/A 12/01/2022   Procedure: HOT HEMOSTASIS (ARGON PLASMA COAGULATION/BICAP);  Surgeon: Benancio Deeds, MD;  Location: [facility] ENDOSCOPY;  Service: Gastroenterology;  Laterality: N/A;   INSERTION OF DIALYSIS CATHETER N/A 10/05/2020   Procedure: ABORTED TUNNELED DIALYSIS CATHETER PLACEMENT RIGHT INTERNAL JUGULAR VEIN ;  Surgeon: Lucretia Roers, MD;  Location: [facility];  Service: General;  Laterality: N/A;   INTRAOPERATIVE TRANSESOPHAGEAL ECHOCARDIOGRAM  06/13/2012   Procedure: INTRAOPERATIVE TRANSESOPHAGEAL ECHOCARDIOGRAM;  Surgeon: Delight Ovens, MD;  Location: [facility] OR;  Service: Open Heart Surgery;  Laterality: N/A;   IR DIALY SHUNT INTRO NEEDLE/INTRACATH INITIAL W/IMG LEFT Left 10/06/2020   IR FLUORO GUIDE CV LINE RIGHT  06/17/2020   IR FLUORO GUIDE CV LINE RIGHT  11/12/2022   IR GENERIC HISTORICAL  07/26/2016   IR FLUORO GUIDE CV LINE RIGHT 07/26/2016 Berdine Dance, MD [facility]   IR GENERIC HISTORICAL  07/26/2016   IR US GUIDE VASC ACCESS RIGHT 07/26/2016 Berdine Dance, MD [facility]   IR GENERIC HISTORICAL  08/02/2016   IR US GUIDE VASC ACCESS RIGHT 08/02/2016 Berdine Dance, MD [facility]   IR GENERIC HISTORICAL  08/02/2016   IR FLUORO GUIDE CV LINE RIGHT 08/02/2016 Berdine Dance, MD [facility]   IR RADIOLOGY PERIPHERAL GUIDED IV START  03/28/2017   IR REMOVAL TUN CV CATH W/O FL  08/11/2020   IR REMOVAL TUN  CV CATH W/O FL  11/15/2022   IR THROMBECTOMY AV FISTULA W/THROMBOLYSIS INC/SHUNT/IMG LEFT Left 06/17/2020   IR US GUIDE VASC ACCESS LEFT  06/17/2020   IR US GUIDE VASC ACCESS RIGHT  03/28/2017   IR US GUIDE VASC ACCESS RIGHT  06/17/2020   IR US GUIDE VASC ACCESS RIGHT  11/12/2022   LEFT HEART CATH AND CORONARY ANGIOGRAPHY N/A 09/20/2016   Procedure: Left Heart Cath and Coronary Angiography;  Surgeon: Lyn Records, MD;  Location: [facility] INVASIVE CV LAB;  Service: Cardiovascular;  Laterality: N/A;   LEFT HEART CATH AND CORS/GRAFTS ANGIOGRAPHY N/A 10/13/2016   Procedure: Left Heart Cath and Cors/Grafts Angiography;  Surgeon: Lennette Bihari, MD;  [facility];  Service: Cardiovascular;  Laterality: N/A;   LEFT HEART CATH AND CORS/GRAFTS ANGIOGRAPHY N/A 07/13/2018   Procedure: LEFT HEART CATH AND  CORS/GRAFTS ANGIOGRAPHY;  Surgeon: Swaziland, Peter M, MD;  Location: [place] INVASIVE CV LAB;  Service: Cardiovascular;  Laterality: N/A;   LEFT HEART CATH AND CORS/GRAFTS ANGIOGRAPHY N/A 07/22/2021   Procedure: LEFT HEART CATH AND CORS/GRAFTS ANGIOGRAPHY;  Surgeon: Runell Gess, MD;  [facility]: [facility] CV LAB;  Service: Cardiovascular;  Laterality: N/A;   LEFT HEART CATHETERIZATION WITH CORONARY ANGIOGRAM N/A 12/15/2011   Procedure: LEFT HEART CATHETERIZATION WITH CORONARY ANGIOGRAM;  Surgeon: Kathleene Hazel, MD;  Location: [facility] CATH LAB;  Service: Cardiovascular;  Laterality: N/A;   LEFT HEART CATHETERIZATION WITH CORONARY ANGIOGRAM N/A 01/10/2012   Procedure: LEFT HEART CATHETERIZATION WITH CORONARY ANGIOGRAM;  Surgeon: Peter M Swaziland, MD;  Location: [facility] CATH LAB;  Service: Cardiovascular;  Laterality: N/A;   LEFT HEART CATHETERIZATION WITH CORONARY ANGIOGRAM N/A 06/08/2012   Procedure: LEFT HEART CATHETERIZATION WITH CORONARY ANGIOGRAM;  Surgeon: Kathleene Hazel, MD;  Location: [facility] CATH LAB;  Service: Cardiovascular;  Laterality: N/A;   LEFT HEART CATHETERIZATION WITH CORONARY/GRAFT ANGIOGRAM N/A  12/10/2013   Procedure: LEFT HEART CATHETERIZATION WITH Isabel Caprice;  Surgeon: Corky Crafts, MD;  Location: [facility] CATH LAB;  Service: Cardiovascular;  Laterality: N/A;   OVARY SURGERY     ovarian cancer   POLYPECTOMY  03/09/2019   Procedure: POLYPECTOMY;  Surgeon: Malissa Hippo, MD;  [facility];  Service: Endoscopy;;  cecal    POLYPECTOMY N/A 09/26/2019   Procedure: DUODENAL POLYPECTOMY;  Surgeon: Malissa Hippo, MD;  [facility];  Service: Endoscopy;  Laterality: N/A;   POLYPECTOMY  08/11/2022   Procedure: POLYPECTOMY;  Surgeon: Beverley Fiedler, MD;  Location: [facility] ENDOSCOPY;  Service: Gastroenterology;;   REVISION OF ARTERIOVENOUS GORETEX GRAFT N/A 02/24/2017   Procedure: REVISION OF ARTERIOVENOUS GORETEX GRAFT (RESECTION);  Surgeon: Renford Dills, MD;  Location: [facility] ORS;  Service: Vascular;  Laterality: N/A;   REVISON OF ARTERIOVENOUS FISTULA Left 06/19/2020   Procedure: REVISION OF LEFT UPPER ARM AV GRAFT WITH INTERPOSITION JUMP GRAFT USING GORE LIMB;  Surgeon: Cephus Shelling, MD;  Location: [facility] OR;  Service: Vascular;  Laterality: Left;   SHUNTOGRAM N/A 10/15/2013   Procedure: Fistulogram;  Surgeon: Nada Libman, MD;  Location: [facility] CATH LAB;  Service: Cardiovascular;  Laterality: N/A;   SUBMUCOSAL TATTOO INJECTION  11/01/2022   Procedure: SUBMUCOSAL TATTOO INJECTION;  Surgeon: Dolores Frame, MD;  [facility];  Service: Gastroenterology;;   THROMBECTOMY / ARTERIOVENOUS GRAFT REVISION  2011   left upper arm   TUBAL LIGATION  1980's   UPPER EXTREMITY ANGIOGRAPHY Bilateral 12/06/2016   Procedure: Upper Extremity Angiography;  Surgeon: Renford Dills, MD;  Location: [facility] INVASIVE CV LAB;  Service: Cardiovascular;  Laterality: Bilateral;   UPPER EXTREMITY INTERVENTION Left 06/06/2017   Procedure: UPPER EXTREMITY INTERVENTION;  Surgeon: Renford Dills, MD;  Location: [facility] INVASIVE CV LAB;  Service: Cardiovascular;   Laterality: Left;   UPPER EXTREMITY VENOGRAPHY Left 10/04/2022   Procedure: UPPER EXTREMITY VENOGRAPHY;  Surgeon: Renford Dills, MD;  Location: [facility] INVASIVE CV LAB;  Service: Cardiovascular;  Laterality: Left;   Social History:  reports that she has never smoked. She has never used smokeless tobacco. She reports that she does not drink alcohol and does not use drugs.  Allergies  Allergen Reactions   Amlodipine Swelling   Aspirin Other (See Comments)    High Doses Mess up her stomach; "makes my bowels have blood in them". Takes 81 mg EC Aspirin    Nitrofurantoin  Hives   Ranexa [Ranolazine] Other (See Comments)    Myoclonus-hospitalized    Bactrim [Sulfamethoxazole-Trimethoprim] Rash   Iodinated Contrast Media Itching and Other (See Comments)   Iron Itching and Other (See Comments)    "they gave me iron in dialysis; had to give me Benadryl cause I had to have the iron" (05/02/2012)   Tylenol [Acetaminophen] Itching and Other (See Comments)    Makes her feet on fire per pt - can tolerate per pt   Gabapentin Other (See Comments)    Unknown reaction   Sucroferric Oxyhydroxide Other (See Comments)    Unknown   Levaquin [Levofloxacin In D5w] Rash   Levofloxacin Rash and Other (See Comments)   Plavix [Clopidogrel Bisulfate] Rash   Protonix [Pantoprazole Sodium] Rash   Venofer [Ferric Oxide] Itching and Other (See Comments)    Patient reports using Benadryl prior to doses as [facility]    Family History  Problem Relation Age of Onset   Heart disease Mother        Heart Disease before age 58   Hyperlipidemia Mother    Hypertension Mother    Diabetes Mother    Heart attack Mother    Heart disease Father        Heart Disease before age 1   Hyperlipidemia Father    Hypertension Father    Diabetes Father    Diabetes Sister    Hypertension Sister    Diabetes Brother    Hyperlipidemia Brother    Heart attack Brother    Hypertension Sister    Heart attack Brother     Colon cancer Child 10   Other Other        noncontributory for early CAD   Esophageal cancer Neg Hx    Liver disease Neg Hx    Kidney disease Neg Hx    Colon polyps Neg Hx     Prior to Admission medications   Medication Sig Start Date End Date Taking? Authorizing Provider  amiodarone (PACERONE) 200 MG tablet Take 200 mg by mouth daily. 05/31/23  Yes [provider]  calcitRIOL (ROCALTROL) 0.25 MCG capsule Take 7 capsules (1.75 mcg total) by mouth every Monday, Wednesday, and Friday with hemodialysis. 12/21/22  Yes Lanae Boast, MD  cinacalcet (SENSIPAR) 30 MG tablet Take 1 tablet (30 mg total) by mouth every Monday, Wednesday, and Friday with hemodialysis. 12/19/22  Yes Lanae Boast, MD  cyanocobalamin (VITAMIN B12) 1000 MCG/ML injection Inject 1,000 mcg into the muscle every 30 (thirty) days.   Yes [provider]  cyclobenzaprine (FLEXERIL) 5 MG tablet Take 5 mg by mouth 3 (three) times daily as needed for muscle spasms.   Yes [provider]  folic acid (FOLVITE) 1 MG tablet Take 1 mg by mouth daily. 05/24/22  Yes [provider]  gabapentin (NEURONTIN) 100 MG capsule Take 1 capsule (100 mg total) by mouth every evening. 06/05/23 08/05/23 Yes Dorcas Carrow, MD  hydrALAZINE (APRESOLINE) 25 MG tablet Take 1 tablet (25 mg total) by mouth 2 (two) times daily. Hold for sbp <110 12/27/22  Yes Kc, Dayna Barker, MD  hydrOXYzine (ATARAX) 25 MG tablet Take 25 mg by mouth every 6 (six) hours as needed for itching. 06/06/22  Yes [provider]  levETIRAcetam (KEPPRA) 500 MG tablet Take 1 tablet (500 mg total) by mouth daily. 11/15/22  Yes Leroy Sea, MD  levothyroxine (SYNTHROID) 75 MCG tablet Take 1 tablet (75 mcg total) by mouth daily at 6 (six) AM. 02/02/23  Yes  Sherryll Burger, Pratik D, DO  metoprolol tartrate (LOPRESSOR) 25 MG tablet Take 0.5 tablets (12.5 mg total) by mouth 2 (two) times daily. Patient taking differently: Take 25 mg by mouth 2 (two) times daily. 02/01/23  08/05/23 Yes Shah, Pratik D, DO  multivitamin (RENA-VIT) TABS tablet Take 1 tablet by mouth daily.   Yes [provider]  nitroGLYCERIN (NITROSTAT) 0.4 MG SL tablet Place 0.4 mg under the tongue every 5 (five) minutes as needed for chest pain.   Yes [provider]  octreotide (SANDOSTATIN LAR) 20 MG injection Inject 20 mg into the muscle every 28 (twenty-eight) days. 11/15/22  Yes Leroy Sea, MD  omeprazole (PRILOSEC) 40 MG capsule Take 40 mg by mouth daily. 05/31/23  Yes [provider]  ondansetron (ZOFRAN-ODT) 4 MG disintegrating tablet Take 4 mg by mouth every 8 (eight) hours as needed for nausea or vomiting. 05/22/23  Yes [provider]  rosuvastatin (CRESTOR) 10 MG tablet Take 1 tablet by mouth once daily 06/06/22  Yes Ellsworth Lennox, [place]    Physical Exam: Vitals:   08/05/23 1500 08/05/23 1515 08/05/23 1530 08/05/23 1545  BP: (!) 141/44 (!) 141/42 (!) 137/42 (!) 142/45  Pulse: (!) 37 (!) 37 (!) 37 (!) 37  Resp: 19 19 19 20   Temp:      TempSrc:      SpO2: 96% 97% 97% 96%  Weight:      Height:       General: Elderly female. Awake and alert and oriented x3. No acute cardiopulmonary distress.  HEENT: Normocephalic atraumatic.  Right and left ears normal in appearance.  Pupils equal, round, reactive to light. Extraocular muscles are intact. Sclerae anicteric and noninjected.  Moist mucosal membranes. No mucosal lesions.  Neck: Neck supple without lymphadenopathy. No carotid bruits. No masses palpated.  Cardiovascular: Bradycardic rate with normal S1-S2 sounds. No murmurs, rubs, gallops auscultated. No JVD.  Respiratory: Good respiratory effort with no wheezes, rales, rhonchi. Lungs clear to auscultation bilaterally.  No accessory muscle use. Abdomen: Soft, nontender, nondistended. Active bowel sounds. No masses or hepatosplenomegaly  Skin: No rashes, lesions, or ulcerations.  Dry, warm to touch. 2+ dorsalis pedis and radial  pulses. Musculoskeletal: No calf or leg pain. All major joints not erythematous nontender.  No upper or lower joint deformation.  Good ROM.  No contractures  Psychiatric: Intact judgment and insight. Pleasant and cooperative. Neurologic: No focal neurological deficits. Strength is 5/5 and symmetric in upper and lower extremities.  Cranial nerves II through XII are grossly intact.  Data Reviewed: Results for orders placed or performed during the hospital encounter of 08/05/23 (from the past 24 hours)  CBC     Status: Abnormal   Collection Time: 08/05/23  2:41 PM  Result Value Ref Range   WBC 4.2 4.0 - 10.5 K/uL   RBC 1.98 (L) 3.87 - 5.11 MIL/uL   Hemoglobin 7.5 (L) 12.0 - 15.0 g/dL   HCT 16.1 (L) 09.6 - 04.5 %   MCV 115.7 (H) 80.0 - 100.0 fL   MCH 37.9 (H) 26.0 - 34.0 pg   MCHC 32.8 30.0 - 36.0 g/dL   RDW Not Measured 40.9 - 15.5 %   Platelets 162 150 - 400 K/uL   nRBC 0.0 0.0 - 0.2 %  Basic metabolic panel     Status: Abnormal   Collection Time: 08/05/23  2:41 PM  Result Value Ref Range   Sodium 135 135 - 145 mmol/L   Potassium 3.6 3.5 - 5.1 mmol/L  Chloride 97 (L) 98 - 111 mmol/L   CO2 24 22 - 32 mmol/L   Glucose, Bld 104 (H) 70 - 99 mg/dL   BUN 29 (H) 8 - 23 mg/dL   Creatinine, Ser 1.61 (H) 0.44 - 1.00 mg/dL   Calcium 8.5 (L) 8.9 - 10.3 mg/dL   GFR, Estimated 10 (L) >60 mL/min   Anion gap 14 5 - 15  Troponin I (High Sensitivity)     Status: Abnormal   Collection Time: 08/05/23  2:41 PM  Result Value Ref Range   Troponin I (High Sensitivity) 40 (H) <18 ng/L  TSH     Status: Abnormal   Collection Time: 08/05/23  2:41 PM  Result Value Ref Range   TSH 9.781 (H) 0.350 - 4.500 uIU/mL  Troponin I (High Sensitivity)     Status: Abnormal   Collection Time: 08/05/23  3:40 PM  Result Value Ref Range   Troponin I (High Sensitivity) 37 (H) <18 ng/L   *Note: Due to a large number of results and/or encounters for the requested time period, some results have not been displayed. A  complete set of results can be found in Results Review.    DG Chest Portable 1 View Result Date: 08/05/2023 CLINICAL DATA:  Fluid overload EXAM: PORTABLE CHEST 1 VIEW COMPARISON:  Chest radiograph dated 05/31/2023 FINDINGS: Lines/tubes: Left internal jugular venous catheter tip projects over the right atrium. Left axillary stent graft. Lungs: Low lung volumes. Diffuse bilateral interstitial opacities. Bibasilar patchy opacities. Pleura: Small bilateral pleural effusions.  No pneumothorax. Heart/mediastinum: Similar enlarged cardiomediastinal silhouette. Bones: Median sternotomy wires are nondisplaced. Degenerative changes of the right shoulder. IMPRESSION: 1. Cardiomegaly with pulmonary edema and small bilateral pleural effusions. 2. Bibasilar patchy opacities, likely atelectasis. Aspiration or pneumonia can be considered in the appropriate clinical setting. Electronically Signed   By: Agustin Cree M.D.   On: 08/05/2023 14:32     Assessment and Plan: No notes have been filed under this hospital service. Service: Hospitalist  Principal Problem:   Bradycardia Active Problems:   End-stage renal disease on hemodialysis (HCC)   Diabetes mellitus type 2 in nonobese [facility])   Acquired hypothyroidism   Chronic combined systolic and diastolic CHF (congestive heart failure) (HCC)   CAD (coronary artery disease)   Atrial fibrillation, chronic (HCC)  Bradycardia Hold metoprolol and amiodarone Patient does have a elevated TSH indicating that she is undertreated for hypothyroidism.  Will increase her dose of Synthroid.  As the patient is not particularly symptomatic and IV Synthroid is not available to me, we will give the patient 125 mcg of Synthroid now and daily. Patient will be admitted to progressive care at [facility] and cardiology will consult As a patient is not symptomatic, we will hold off of giving atropine for the time being Hypothyroidism Treatment as above End-stage renal disease on  dialysis Nephrology consulted Diabetes Sliding scale insulin Hypertension\  Hydralazine Atrial fibrillation Currently bradycardic and rhythm controlled   Advance Care Planning:   Code Status: Prior full code confirmed by patient  Consults: cardiology, nephrology  Family Communication: husband present  Severity of Illness: The appropriate patient status for this patient is INPATIENT. Inpatient status is judged to be reasonable and necessary in order to provide the required intensity of service to ensure the patient's safety. The patient's presenting symptoms, physical exam findings, and initial radiographic and laboratory data in the context of their chronic comorbidities is felt to place them at high risk for further clinical deterioration. Furthermore, it  is not anticipated that the patient will be medically stable for discharge from the hospital within 2 midnights of admission.   * I certify that at the point of admission it is my clinical judgment that the patient will require inpatient hospital care spanning beyond 2 midnights from the point of admission due to high intensity of service, high risk for further deterioration and high frequency of surveillance required.*  Author: Levie Heritage, DO 08/05/2023 5:27 PM  For on call review [email or web address].

## 2023-08-05 NOTE — ED Provider Notes (Signed)
Patient care transferred to me.  Patient is noted to be persistently bradycardic around 37.  She is not symptomatic at rest though does report that she was fatigued when trying to walk to the car for dialysis.  She has not had dialysis since Wednesday, today is Saturday.  She has not taken any extra of her medicines.  She denies any recent bloody stools.  4:28 PM I discussed the case with cardiology, Dr. Nelly Laurence.  He advises to hold the amiodarone and metoprolol.  If she looks well, would be reasonable to keep her at [facility], though it is hard to predict whether or not she would end up needing to get a temporary pacemaker if she were to worsen.  However she otherwise appears stable on my exam.  4:56 PM Discussed with Dr. Arlean Hopping of nephrology and Dr. Adrian Blackwater of hospitalist service.  Nephrology is recommending transfer to [facility] for dialysis as it would be too risky to do this in the [facility].  Patient otherwise is relatively stable and Dr. Adrian Blackwater will admit to [facility].   Pricilla Loveless, MD 08/05/23 2139

## 2023-08-05 NOTE — Consult Note (Incomplete)
Renal Service Consult Note [facility]  Shawna [name] 08/05/2023 Shawna Krabbe, MD Requesting Physician: Dr. Adrian Blackwater  Reason for Consult: ESRD pt w/ bradycardia  HPI: The patient is a 83 y.o. year-old w/ PMH as below who presented to ED for bradycardia. She went to her HD today (because of missing Friday's HD) and about half-way through when they noticed a low heart rate. Pt denied any symptoms. HD was interrupted and she was sent to ED at [facility]. In ED EKG w/o ST segment changes, no advanced block. Sinus bradycardia at HR 38 bpm. Heart rate typically on the low side like high 50s or low 60s looking back, and +hx of LBBB. Pt was asymptomatic at rest, although she was fatigued when trying to walk to the car for dialysis earlier today. Labs showed K+3.6, CO2 24, BUN 29, creat 4.33. Hb 7.5, wbc 4K. CXR showed bilat pleural effusions, and bibasilar patchy opacities as on previous xrays. Question of pulm edema vs vasc congestion. Pt was on RA in the ED. Choice was made to admit her at [facility] as doing HD at [facility] w/ such bradycardia would be a setup for complications.   Pt transferred to [facility] overnight last night. I was called and approved a request to access her [facility] for blood transfusion ( and possibly other needs , like labs) because they had tried 7 times to get IV in unsuccessfully.  She got the prbc's and IV team has re-capped the [facility]. This per the floor RN this am.   Pt seen in room. She is w/o c/o's, no SOB, no cough no chestpain or abd pain.   Telemetry HR right now is about 43- 44 bpm.  Yest at [facility] ED it was 37-38 range. Her amio and metoprolol have been on hold. Her normal HR's are low in the 50s- 60s. She had atrial fib, esrd and low thyroid. Husband is at bedside. I showed him how her labs were good in terms of creat 4.4 today, when 1 month ago it was 4.5 and 6.4, so in terms of her HD she is well dialyzed. Also assured him that the notes said that she got "1/2 of her dialysis", not "1/2  hr of her dialysis" yesterday at her OP unit.   ROS - denies CP, no joint pain, no HA, no blurry vision, no rash, no diarrhea, no nausea/ vomiting, no dysuria, no difficulty voiding   Past Medical History  Past Medical History:  Diagnosis Date   Acute on chronic respiratory failure with hypoxia (HCC) 10/10/2016   Anxiety    Arthritis    AVM (arteriovenous malformation) of colon    CAD (coronary artery disease)    a. s/p CABG in 2013 b. DES to D1 in 10/2016. c. cath in 07/2018 showing patent grafts with occlusion of D1 at prior stent site and progression of PDA disease --> medical management recommended   Carotid artery disease (HCC)    a. 60-79% LICA, 03/2012    Chronic bronchitis (HCC)    Chronic HFrEF (heart failure with reduced ejection fraction) (HCC)    Colon cancer (HCC) 1992   Esophageal stricture    ESRD on hemodialysis (HCC)    ESRD due to HTN, started dialysis 2011 and gets HD at [facility] with Dr Fausto Skillern on MWF schedule.  Access is LUA AVF as of Sept 2014.    GERD (gastroesophageal reflux disease)    High cholesterol 12/2011   History of blood transfusion 07/2011; 12/2011; 01/2012  X 2; 04/2012   History of gout    History of lower GI bleeding    Hypertension    Iron deficiency anemia    Jugular vein occlusion, right (HCC)    Mitral regurgitation    a. Moderate by echo, 02/2012   Mitral valve disease    NSVT (nonsustained ventricular tachycardia) (HCC)    Ovarian cancer (HCC) 1992   PAF (paroxysmal atrial fibrillation) (HCC)    Pneumonia ~ 2009   PUD (peptic ulcer disease)    TIA (transient ischemic attack)    Tricuspid valve disease    Past Surgical History  Past Surgical History:  Procedure Laterality Date   A/V FISTULAGRAM Left 10/04/2022   Procedure: A/V Fistulagram;  Surgeon: Renford Dills, MD;  Location: [facility] INVASIVE CV LAB;  Service: Cardiovascular;  Laterality: Left;   A/V SHUNTOGRAM Left 03/19/2019   Procedure: A/V SHUNTOGRAM;  Surgeon: Renford Dills, MD;  Location: [facility] INVASIVE CV LAB;  Service: Cardiovascular;  Laterality: Left;   ABDOMINAL HYSTERECTOMY  1992   APPENDECTOMY  06/1990   AV FISTULA PLACEMENT  07/2009   left upper arm   AV FISTULA PLACEMENT Right 09/06/2016   Procedure: RIGHT FOREARM ARTERIOVENOUS (AV) GRAFT;  Surgeon: Sherren Kerns, MD;  [facility]: [facility] OR;  Service: Vascular;  Laterality: Right;   AV FISTULA PLACEMENT N/A 02/24/2017   Procedure: INSERTION OF ARTERIOVENOUS (AV) GORE-TEX GRAFT ARM (BRACHIAL AXILLARY);  Surgeon: Renford Dills, MD;  Location: [facility] ORS;  Service: Vascular;  Laterality: N/A;   AVGG REMOVAL Right 09/06/2016   Procedure: REMOVAL OF Right Arm ARTERIOVENOUS GORETEX GRAFT and Vein Patch angioplasty of brachial artery;  Surgeon: Chuck Hint, MD;  Location: [facility] OR;  Service: Vascular;  Laterality: Right;   BIOPSY  09/26/2019   Procedure: BIOPSY;  Surgeon: Malissa Hippo, MD;  [facility] ENDO SU[facility];  Service: Endoscopy;;   BIOPSY  11/01/2022   Procedure: BIOPSY;  Surgeon: Dolores Frame, MD;  [facility] ENDO [facility];  Service: Gastroenterology;;   COLON RESECTION  1992   COLON SURGERY     COLONOSCOPY N/A 03/09/2019   Procedure: COLONOSCOPY;  Surgeon: Malissa Hippo, MD;  [facility] END[facility];  Service: Endoscopy;  Laterality: N/A;   COLONOSCOPY N/A 08/11/2022   Procedure: COLONOSCOPY;  Surgeon: Beverley Fiedler, MD;  Location: [facility] ENDOSCOPY;  Service: Gastroenterology;  Laterality: N/A;   COLONOSCOPY WITH PROPOFOL N/A 06/08/2021   Procedure: COLONOSCOPY WITH PROPOFOL;  Surgeon: Dolores Frame, MD;  [facility];  Service: Gastroenterology;  Laterality: N/A;  9:05 /Patient is on dialysis Mon Wed Fri   CORONARY ANGIOPLASTY WITH STENT PLACEMENT  12/15/11   "2"   CORONARY ANGIOPLASTY WITH STENT PLACEMENT  y/2013   "1; makes total of 3" (05/02/2012)   CORONARY ARTERY BYPASS GRAFT  06/13/2012   Procedure: CORONARY ARTERY BYPASS GRAFTING (CABG);  Surgeon:  Delight Ovens, MD;  Location: [facility] OR;  Service: Open Heart Surgery;  Laterality: N/A;  cabg x four;  using left internal mammary artery, and left leg greater saphenous vein harvested endoscopically   CORONARY STENT INTERVENTION N/A 10/13/2016   Procedure: Coronary Stent Intervention;  Surgeon: Lennette Bihari, MD;  [facility] INV[facility] CV LAB;  Service: Cardiovascular;  Laterality: N/A;   DIALYSIS/PERMA CATHETER REMOVAL N/A 04/18/2017   Procedure: DIALYSIS/PERMA CATHETER REMOVAL;  Surgeon: Renford Dills, MD;  Location: [facility] INVASIVE CV LAB;  Service: Cardiovascular;  Laterality: N/A;   DILATION AND CURETTAGE OF UTERUS  ENTEROSCOPY N/A 06/08/2021   Procedure: PUSH ENTEROSCOPY;  Surgeon: Dolores Frame, MD;  [facility];  Service: Gastroenterology;  Laterality: N/A;   ENTEROSCOPY N/A 11/01/2022   Procedure: ENTEROSCOPY;  Surgeon: Dolores Frame, MD;  [facility];  Service: Gastroenterology;  Laterality: N/A;   ENTEROSCOPY N/A 12/01/2022   Procedure: ENTEROSCOPY;  Surgeon: Benancio Deeds, MD;  Location: [facility] ENDOSCOPY;  Service: Gastroenterology;  Laterality: N/A;   ESOPHAGOGASTRODUODENOSCOPY  01/20/2012   Procedure: ESOPHAGOGASTRODUODENOSCOPY (EGD);  Surgeon: Meryl Dare, MD,FACG;  Location: [facility] ENDOSCOPY;  Service: Endoscopy;  Laterality: N/A;   ESOPHAGOGASTRODUODENOSCOPY N/A 03/26/2013   Procedure: ESOPHAGOGASTRODUODENOSCOPY (EGD);  Surgeon: Hilarie Fredrickson, MD;  Location: [facility] ENDOSCOPY;  Service: Endoscopy;  Laterality: N/A;   ESOPHAGOGASTRODUODENOSCOPY N/A 04/30/2015   Procedure: ESOPHAGOGASTRODUODENOSCOPY (EGD);  Surgeon: Malissa Hippo, MD;  [facility];  Service: Endoscopy;  Laterality: N/A;  1pm - moved to 10/20 @ 1:10   ESOPHAGOGASTRODUODENOSCOPY N/A 07/29/2016   Procedure: ESOPHAGOGASTRODUODENOSCOPY (EGD);  Surgeon: Ruffin Frederick, MD;  Location: [facility] ENDOSCOPY;  Service: Gastroenterology;  Laterality: N/A;  enteroscopy    ESOPHAGOGASTRODUODENOSCOPY N/A 09/26/2019   Procedure: ESOPHAGOGASTRODUODENOSCOPY (EGD);  Surgeon: Malissa Hippo, MD;  [facility];  Service: Endoscopy;  Laterality: N/A;  1250   ESOPHAGOGASTRODUODENOSCOPY N/A 08/11/2022   Procedure: ESOPHAGOGASTRODUODENOSCOPY (EGD);  Surgeon: Beverley Fiedler, MD;  Location: [facility] ENDOSCOPY;  Service: Gastroenterology;  Laterality: N/A;   ESOPHAGOGASTRODUODENOSCOPY (EGD) WITH PROPOFOL N/A 02/05/2021   Procedure: ESOPHAGOGASTRODUODENOSCOPY (EGD) WITH PROPOFOL;  Surgeon: Lanelle Bal, DO;  [facility];  Service: Endoscopy;  Laterality: N/A;   ESOPHAGOGASTRODUODENOSCOPY (EGD) WITH PROPOFOL N/A 08/27/2022   Procedure: ESOPHAGOGASTRODUODENOSCOPY (EGD) WITH PROPOFOL;  Surgeon: Lanelle Bal, DO;  [facility];  Service: Endoscopy;  Laterality: N/A;   GIVENS CAPSULE STUDY N/A 03/07/2019   Procedure: GIVENS CAPSULE STUDY;  Surgeon: Malissa Hippo, MD;  [facility];  Service: Endoscopy;  Laterality: N/A;  7:30   GIVENS CAPSULE STUDY N/A 04/22/2021   Procedure: GIVENS CAPSULE STUDY;  Surgeon: Malissa Hippo, MD;  [facility];  Service: Endoscopy;  Laterality: N/A;  7:30   GIVENS CAPSULE STUDY N/A 08/27/2022   Procedure: GIVENS CAPSULE STUDY;  Surgeon: Lanelle Bal, DO;  [facility];  Service: Endoscopy;  Laterality: N/A;   HOT HEMOSTASIS N/A 08/11/2022   Procedure: HOT HEMOSTASIS (ARGON PLASMA COAGULATION/BICAP);  Surgeon: Beverley Fiedler, MD;  Location: [facility] ENDOSCOPY;  Service: Gastroenterology;  Laterality: N/A;   HOT HEMOSTASIS N/A 12/01/2022   Procedure: HOT HEMOSTASIS (ARGON PLASMA COAGULATION/BICAP);  Surgeon: Benancio Deeds, MD;  Location: [facility] ENDOSCOPY;  Service: Gastroenterology;  Laterality: N/A;   INSERTION OF DIALYSIS CATHETER N/A 10/05/2020   Procedure: ABORTED TUNNELED DIALYSIS CATHETER PLACEMENT RIGHT INTERNAL JUGULAR VEIN ;  Surgeon: Lucretia Roers, MD;  Location: [facility] ORS;   Service: General;  Laterality: N/A;   INTRAOPERATIVE TRANSESOPHAGEAL ECHOCARDIOGRAM  06/13/2012   Procedure: INTRAOPERATIVE TRANSESOPHAGEAL ECHOCARDIOGRAM;  Surgeon: Delight Ovens, MD;  Location: [facility] OR;  Service: Open Heart Surgery;  Laterality: N/A;   IR DIALY SHUNT INTRO NEEDLE/INTRACATH INITIAL W/IMG LEFT Left 10/06/2020   IR FLUORO GUIDE CV LINE RIGHT  06/17/2020   IR FLUORO GUIDE CV LINE RIGHT  11/12/2022   IR GENERIC HISTORICAL  07/26/2016   IR FLUORO GUIDE CV LINE RIGHT 07/26/2016 Berdine Dance, MD [facility]   IR GENERIC HISTORICAL  07/26/2016   IR US GUIDE VASC  ACCESS RIGHT 07/26/2016 Berdine Dance, MD [facility]   IR GENERIC HISTORICAL  08/02/2016   IR US GUIDE VASC ACCESS RIGHT 08/02/2016 Berdine Dance, MD [facility]   IR GENERIC HISTORICAL  08/02/2016   IR FLUORO GUIDE CV LINE RIGHT 08/02/2016 Berdine Dance, MD [facility]   IR RADIOLOGY PERIPHERAL GUIDED IV START  03/28/2017   IR REMOVAL TUN CV CATH W/O FL  08/11/2020   IR REMOVAL TUN CV CATH W/O FL  11/15/2022   IR THROMBECTOMY AV FISTULA W/THROMBOLYSIS INC/SHUNT/IMG LEFT Left 06/17/2020   IR US GUIDE VASC ACCESS LEFT  06/17/2020   IR US GUIDE VASC ACCESS RIGHT  03/28/2017   IR US GUIDE VASC ACCESS RIGHT  06/17/2020   IR US GUIDE VASC ACCESS RIGHT  11/12/2022   LEFT HEART CATH AND CORONARY ANGIOGRAPHY N/A 09/20/2016   Procedure: Left Heart Cath and Coronary Angiography;  Surgeon: Lyn Records, MD;  Location: [facility] INVASIVE CV LAB;  Service: Cardiovascular;  Laterality: N/A;   LEFT HEART CATH AND CORS/GRAFTS ANGIOGRAPHY N/A 10/13/2016   Procedure: Left Heart Cath and Cors/Grafts Angiography;  Surgeon: Lennette Bihari, MD;  [facility] INVAS[facility] CV LAB;  Service: Cardiovascular;  Laterality: N/A;   LEFT HEART CATH AND CORS/GRAFTS ANGIOGRAPHY N/A 07/13/2018   Procedure: LEFT HEART CATH AND CORS/GRAFTS ANGIOGRAPHY;  Surgeon: Swaziland, Peter M, MD;  Location: [facility] INVASIVE CV LAB;  Service: Cardiovascular;  Laterality: N/A;   LEFT HEART CATH AND  CORS/GRAFTS ANGIOGRAPHY N/A 07/22/2021   Procedure: LEFT HEART CATH AND CORS/GRAFTS ANGIOGRAPHY;  Surgeon: Runell Gess, MD;  [facility] INVASIVE CV LAB;  Service: Cardiovascular;  Laterality: N/A;   LEFT HEART CATHETERIZATION WITH CORONARY ANGIOGRAM N/A 12/15/2011   Procedure: LEFT HEART CATHETERIZATION WITH CORONARY ANGIOGRAM;  Surgeon: Kathleene Hazel, MD;  Location: [facility] CATH LAB;  Service: Cardiovascular;  Laterality: N/A;   LEFT HEART CATHETERIZATION WITH CORONARY ANGIOGRAM N/A 01/10/2012   Procedure: LEFT HEART CATHETERIZATION WITH CORONARY ANGIOGRAM;  Surgeon: Peter M Swaziland, MD;  Location: [facility] CATH LAB;  Service: Cardiovascular;  Laterality: N/A;   LEFT HEART CATHETERIZATION WITH CORONARY ANGIOGRAM N/A 06/08/2012   Procedure: LEFT HEART CATHETERIZATION WITH CORONARY ANGIOGRAM;  Surgeon: Kathleene Hazel, MD;  Location: [facility] CATH LAB;  Service: Cardiovascular;  Laterality: N/A;   LEFT HEART CATHETERIZATION WITH CORONARY/GRAFT ANGIOGRAM N/A 12/10/2013   Procedure: LEFT HEART CATHETERIZATION WITH Isabel Caprice;  Surgeon: Corky Crafts, MD;  Location: [facility] CATH LAB;  Service: Cardiovascular;  Laterality: N/A;   OVARY SURGERY     ovarian cancer   POLYPECTOMY  03/09/2019   Procedure: POLYPECTOMY;  Surgeon: Malissa Hippo, MD;  [facility];  Service: Endoscopy;;  cecal    POLYPECTOMY N/A 09/26/2019   Procedure: DUODENAL POLYPECTOMY;  Surgeon: Malissa Hippo, MD;  [facility];  Service: Endoscopy;  Laterality: N/A;   POLYPECTOMY  08/11/2022   Procedure: POLYPECTOMY;  Surgeon: Beverley Fiedler, MD;  Location: [facility] ENDOSCOPY;  Service: Gastroenterology;;   REVISION OF ARTERIOVENOUS GORETEX GRAFT N/A 02/24/2017   Procedure: REVISION OF ARTERIOVENOUS GORETEX GRAFT (RESECTION);  Surgeon: Renford Dills, MD;  Location: [facility] ORS;  Service: Vascular;  Laterality: N/A;   REVISON OF ARTERIOVENOUS FISTULA Left 06/19/2020   Procedure: REVISION OF LEFT UPPER ARM  AV GRAFT WITH INTERPOSITION JUMP GRAFT USING GORE LIMB;  Surgeon: Cephus Shelling, MD;  Location: [facility] OR;  Service: Vascular;  Laterality: Left;   SHUNTOGRAM N/A 10/15/2013   Procedure: Donald Prose;  Surgeon: Faylene Million  Janae Bridgeman, MD;  [facility]: [facility] CATH LAB;  Service: Cardiovascular;  Laterality: N/A;   SUBMUCOSAL TATTOO INJECTION  11/01/2022   Procedure: SUBMUCOSAL TATTOO INJECTION;  Surgeon: Dolores Frame, MD;  [facility];  Service: Gastroenterology;;   THROMBECTOMY / ARTERIOVENOUS GRAFT REVISION  2011   left upper arm   TUBAL LIGATION  1980's   UPPER EXTREMITY ANGIOGRAPHY Bilateral 12/06/2016   Procedure: Upper Extremity Angiography;  Surgeon: Renford Dills, MD;  Location: [facility] INVASIVE CV LAB;  Service: Cardiovascular;  Laterality: Bilateral;   UPPER EXTREMITY INTERVENTION Left 06/06/2017   Procedure: UPPER EXTREMITY INTERVENTION;  Surgeon: Renford Dills, MD;  Location: [facility] INVASIVE CV LAB;  Service: Cardiovascular;  Laterality: Left;   UPPER EXTREMITY VENOGRAPHY Left 10/04/2022   Procedure: UPPER EXTREMITY VENOGRAPHY;  Surgeon: Renford Dills, MD;  Location: [facility] INVASIVE CV LAB;  Service: Cardiovascular;  Laterality: Left;   Family History  Family History  Problem Relation Age of Onset   Heart disease Mother        Heart Disease before age 29   Hyperlipidemia Mother    Hypertension Mother    Diabetes Mother    Heart attack Mother    Heart disease Father        Heart Disease before age 13   Hyperlipidemia Father    Hypertension Father    Diabetes Father    Diabetes Sister    Hypertension Sister    Diabetes Brother    Hyperlipidemia Brother    Heart attack Brother    Hypertension Sister    Heart attack Brother    Colon cancer Child 72   Other Other        noncontributory for early CAD   Esophageal cancer Neg Hx    Liver disease Neg Hx    Kidney disease Neg Hx    Colon polyps Neg Hx    Social History  reports that she has never  smoked. She has never used smokeless tobacco. She reports that she does not drink alcohol and does not use drugs. Allergies  Allergies  Allergen Reactions   Amlodipine Swelling   Aspirin Other (See Comments)    High Doses Mess up her stomach; "makes my bowels have blood in them". Takes 81 mg EC Aspirin    Nitrofurantoin Hives   Ranexa [Ranolazine] Other (See Comments)    Myoclonus-hospitalized    Bactrim [Sulfamethoxazole-Trimethoprim] Rash   Iodinated Contrast Media Itching and Other (See Comments)   Iron Itching and Other (See Comments)    "they gave me iron in dialysis; had to give me Benadryl cause I had to have the iron" (05/02/2012)   Tylenol [Acetaminophen] Itching and Other (See Comments)    Makes her feet on fire per pt - can tolerate per pt   Gabapentin Other (See Comments)    Unknown reaction   Sucroferric Oxyhydroxide Other (See Comments)    Unknown   Levaquin [Levofloxacin In D5w] Rash   Levofloxacin Rash and Other (See Comments)   Plavix [Clopidogrel Bisulfate] Rash   Protonix [Pantoprazole Sodium] Rash   Venofer [Ferric Oxide] Itching and Other (See Comments)    Patient reports using Benadryl prior to doses as [facility]   Home medications Prior to Admission medications   Medication Sig Start Date End Date Taking? Authorizing Provider  amiodarone (PACERONE) 200 MG tablet Take 200 mg by mouth daily. 05/31/23  Yes [provider]  calcitRIOL (ROCALTROL) 0.25 MCG capsule Take 7 capsules (1.75 mcg total) by  mouth every Monday, Wednesday, and Friday with hemodialysis. 12/21/22  Yes Lanae Boast, MD  cinacalcet (SENSIPAR) 30 MG tablet Take 1 tablet (30 mg total) by mouth every Monday, Wednesday, and Friday with hemodialysis. 12/19/22  Yes Lanae Boast, MD  cyanocobalamin (VITAMIN B12) 1000 MCG/ML injection Inject 1,000 mcg into the muscle every 30 (thirty) days.   Yes [provider]  cyclobenzaprine (FLEXERIL) 5 MG tablet Take 5 mg by mouth 3 (three) times  daily as needed for muscle spasms.   Yes [provider]  folic acid (FOLVITE) 1 MG tablet Take 1 mg by mouth daily. 05/24/22  Yes [provider]  gabapentin (NEURONTIN) 100 MG capsule Take 1 capsule (100 mg total) by mouth every evening. 06/05/23 08/05/23 Yes Dorcas Carrow, MD  hydrALAZINE (APRESOLINE) 25 MG tablet Take 1 tablet (25 mg total) by mouth 2 (two) times daily. Hold for sbp <110 12/27/22  Yes Kc, Dayna Barker, MD  hydrOXYzine (ATARAX) 25 MG tablet Take 25 mg by mouth every 6 (six) hours as needed for itching. 06/06/22  Yes [provider]  levETIRAcetam (KEPPRA) 500 MG tablet Take 1 tablet (500 mg total) by mouth daily. 11/15/22  Yes Leroy Sea, MD  levothyroxine (SYNTHROID) 75 MCG tablet Take 1 tablet (75 mcg total) by mouth daily at 6 (six) AM. 02/02/23  Yes Sherryll Burger, Pratik D, DO  metoprolol tartrate (LOPRESSOR) 25 MG tablet Take 0.5 tablets (12.5 mg total) by mouth 2 (two) times daily. Patient taking differently: Take 25 mg by mouth 2 (two) times daily. 02/01/23 08/05/23 Yes Shah, Pratik D, DO  multivitamin (RENA-VIT) TABS tablet Take 1 tablet by mouth daily.   Yes [provider]  nitroGLYCERIN (NITROSTAT) 0.4 MG SL tablet Place 0.4 mg under the tongue every 5 (five) minutes as needed for chest pain.   Yes [provider]  octreotide (SANDOSTATIN LAR) 20 MG injection Inject 20 mg into the muscle every 28 (twenty-eight) days. 11/15/22  Yes Leroy Sea, MD  omeprazole (PRILOSEC) 40 MG capsule Take 40 mg by mouth daily. 05/31/23  Yes [provider]  ondansetron (ZOFRAN-ODT) 4 MG disintegrating tablet Take 4 mg by mouth every 8 (eight) hours as needed for nausea or vomiting. 05/22/23  Yes [provider]  rosuvastatin (CRESTOR) 10 MG tablet Take 1 tablet by mouth once daily 06/06/22  Yes Ellsworth Lennox, [place]     Vitals:   08/05/23 1830 08/05/23 1845 08/05/23 1900 08/05/23 1930  BP: (!) 156/47 (!) 152/47 (!) 152/41 (!)  157/46  Pulse:   (!) 37 (!) 38  Resp: 17 18 19 16   Temp:      TempSrc:      SpO2:   97% 99%  Weight:      Height:       Exam Gen alert, no distress, elderly lady on RA No rash, cyanosis or gangrene Sclera anicteric, throat clear  No jvd or bruits, flat neck veins Chest clear bilat to bases, no rales/ wheezing RRR no MRG Abd soft ntnd no mass or ascites +bs GU defer MS no joint effusions or deformity Ext bilat 1+ pretib edema, no other edema Neuro is alert, Ox 3 , nf   LIJ TDC intact     Renal-related home meds: - rocaltrol 1.75 mcg mwf - sensipar 30 mg mwf - hydralazine 25 bid - metoprolol 25 bid - renavite - others: statin, prilosec, sl ntg, octreotide, T4, keppra, gabapentin, amiodarone    OP HD: MWF [facility] From nov 2024 -->  3.5h 55kg  2/2.5 bath  300/500   LIJ TDC  Heparin none  - needs updating   CXR - bilat pleural effusions (chronic) and bibasilar opacities as on priors also. Pulm edema noted but the film quality is poor in my opinion (over-penetrated); I'm not sure the perihilar congestion is different from priors.     Assessment/ Plan: Sinus bradycardia - per pmd / cardiology, amio and metoprolol are on hold. Her b/l heart rate is around 55- 65 bpm from prior ekgs.  ESRD - on HD MWF. She did 1/2 of HD on Saturday as a make-up for Friday (which was missed). No urgent indication for HD today. Pt is on room air, labs okay. Plan is for HD in am.  HTN - bp's are stable , 150/ 60 range. Was on hydralazine/ metoprolol at home. Holding BB and amiodarone now due to low HR.  Poor IV access: needed to use the [facility] last night for prbc's. Get labs w/ HD in am. Will give IV vanc 1.25 gm x 1 and will ask IV team to good ahead an re-access the line now because it is not safe to not have IV access w/ her bradycardia situation. Medical team knows to contact CCM or IR tomorrow for central line placement; then we can de-access the [facility].  Volume - mild vol excess on exam.  Questionable vol excess by cxr.  UFG 2.75 L w/ Hd tomorrow.  Anemia of esrd - Hb 7.- 8 range. Get records, got 1 u prbc's overnight. Transfuse prn.  Secondary hyperparathyroidism - Ca in range, add on phos /alb Chronic resp failure - on home O2 Atrial fib - per pmd      Vinson Moselle  MD [facility] 08/06/2023, 3:59 PM  Recent Labs  Lab 08/05/23 1441  HGB 7.5*  CALCIUM 8.5*  CREATININE 4.33*  K 3.6   Inpatient medications:  [START ON 08/06/2023] levothyroxine  125 mcg Oral Q0600

## 2023-08-05 NOTE — ED Triage Notes (Signed)
Pt BIB [facility] from [facility] due to sinus brady of 35-40 BPM. Pt is lethargic but is easily awakened. Pt completed 30 min of 4 hour session of dialysis today.

## 2023-08-05 NOTE — ED Notes (Signed)
Pt eating [place] sandwich. Denies pain. Husband at bedside.

## 2023-08-06 ENCOUNTER — Encounter (HOSPITAL_COMMUNITY): Payer: Self-pay | Admitting: Family Medicine

## 2023-08-06 DIAGNOSIS — R001 Bradycardia, unspecified: Secondary | ICD-10-CM | POA: Diagnosis not present

## 2023-08-06 LAB — CBC
HCT: 18.7 % — ABNORMAL LOW (ref 36.0–46.0)
Hemoglobin: 6.3 g/dL — CL (ref 12.0–15.0)
MCH: 38.7 pg — ABNORMAL HIGH (ref 26.0–34.0)
MCHC: 33.7 g/dL (ref 30.0–36.0)
MCV: 114.7 fL — ABNORMAL HIGH (ref 80.0–100.0)
Platelets: 183 10*3/uL (ref 150–400)
RBC: 1.63 MIL/uL — ABNORMAL LOW (ref 3.87–5.11)
RDW: 26 % — ABNORMAL HIGH (ref 11.5–15.5)
WBC: 5.9 10*3/uL (ref 4.0–10.5)
nRBC: 0 % (ref 0.0–0.2)

## 2023-08-06 LAB — BASIC METABOLIC PANEL
Anion gap: 11 (ref 5–15)
BUN: 30 mg/dL — ABNORMAL HIGH (ref 8–23)
CO2: 22 mmol/L (ref 22–32)
Calcium: 8.3 mg/dL — ABNORMAL LOW (ref 8.9–10.3)
Chloride: 98 mmol/L (ref 98–111)
Creatinine, Ser: 4.9 mg/dL — ABNORMAL HIGH (ref 0.44–1.00)
GFR, Estimated: 8 mL/min — ABNORMAL LOW (ref >60)
Glucose, Bld: 113 mg/dL — ABNORMAL HIGH (ref 70–99)
Potassium: 3.6 mmol/L (ref 3.5–5.1)
Sodium: 131 mmol/L — ABNORMAL LOW (ref 135–145)

## 2023-08-06 LAB — MAGNESIUM: Magnesium: 1.4 mg/dL — ABNORMAL LOW (ref 1.7–2.4)

## 2023-08-06 LAB — ALBUMIN: Albumin: 2.6 g/dL — ABNORMAL LOW (ref 3.5–5.0)

## 2023-08-06 LAB — PREPARE RBC (CROSSMATCH)

## 2023-08-06 LAB — PHOSPHORUS: Phosphorus: 4.4 mg/dL (ref 2.5–4.6)

## 2023-08-06 MED ORDER — SODIUM CHLORIDE 0.9% FLUSH: 10.0000 mL | INTRAVENOUS | Status: DC | PRN

## 2023-08-06 MED ORDER — MAGNESIUM SULFATE IN D5W 1-5 GM/100ML-% IV SOLN
1.0000 g | Freq: Once | INTRAVENOUS | Status: DC
  Filled 2023-08-06: qty 100

## 2023-08-06 MED ORDER — CHLORHEXIDINE GLUCONATE CLOTH 2 % EX PADS
6.0000 | MEDICATED_PAD | Freq: Every day | CUTANEOUS | Status: DC
  Administered 2023-08-06 – 2023-08-08 (×3): 6 via TOPICAL

## 2023-08-06 MED ORDER — SODIUM CHLORIDE 0.9 % IV SOLN: INTRAVENOUS | Status: AC

## 2023-08-06 MED ORDER — MAGNESIUM OXIDE -MG SUPPLEMENT 400 (240 MG) MG PO TABS
800.0000 mg | ORAL_TABLET | Freq: Once | ORAL | Status: AC
  Administered 2023-08-06: 800 mg via ORAL
  Filled 2023-08-06: qty 2

## 2023-08-06 MED ORDER — ATROPINE SULFATE 1 MG/10ML IJ SOSY: 0.5000 mg | PREFILLED_SYRINGE | INTRAMUSCULAR | Status: DC | PRN

## 2023-08-06 MED ORDER — LIDOCAINE HCL (PF) 1 % IJ SOLN: 5.0000 mL | INTRAMUSCULAR | Status: DC | PRN

## 2023-08-06 MED ORDER — ANTICOAGULANT SODIUM CITRATE 4% (200MG/5ML) IV SOLN: 5.0000 mL | Status: DC | PRN

## 2023-08-06 MED ORDER — ALTEPLASE 2 MG IJ SOLR: 2.0000 mg | Freq: Once | INTRAMUSCULAR | Status: DC | PRN

## 2023-08-06 MED ORDER — LIDOCAINE-PRILOCAINE 2.5-2.5 % EX CREA: 1.0000 | TOPICAL_CREAM | CUTANEOUS | Status: DC | PRN

## 2023-08-06 MED ORDER — HEPARIN SODIUM (PORCINE) 1000 UNIT/ML DIALYSIS
1000.0000 [IU] | INTRAMUSCULAR | Status: DC | PRN
  Administered 2023-08-07: 1900 [IU]
  Filled 2023-08-06 (×2): qty 1

## 2023-08-06 MED ORDER — PENTAFLUOROPROP-TETRAFLUOROETH EX AERO: 1.0000 | INHALATION_SPRAY | CUTANEOUS | Status: DC | PRN

## 2023-08-06 MED ORDER — VANCOMYCIN HCL 1250 MG/250ML IV SOLN
1250.0000 mg | Freq: Once | INTRAVENOUS | Status: AC
  Administered 2023-08-06: 1250 mg via INTRAVENOUS
  Filled 2023-08-06: qty 250

## 2023-08-06 MED ORDER — CHLORHEXIDINE GLUCONATE CLOTH 2 % EX PADS: 6.0000 | MEDICATED_PAD | Freq: Every day | CUTANEOUS | Status: DC

## 2023-08-06 MED ORDER — SODIUM CHLORIDE 0.9% IV SOLUTION: Freq: Once | INTRAVENOUS | Status: AC

## 2023-08-06 NOTE — Progress Notes (Signed)
This RN spoke with on-call Nephrology,  Dr.Schertz, who has been previously consulted.  Per these discussions, Dr. Arlean Hopping conveys that is okay to establish primary IV using the blue port of the patient's tunneled HD catheter until peripheral access can be further addressed in the morning.  Subsequently, the IV team has been able to set up access through the venous line of the HD cath, from which her morning labs have been sent.

## 2023-08-06 NOTE — Progress Notes (Incomplete)
TR[facility] night cross cover note:   I was notified by RN of the patient's arrival from [facility].  She was admitted from independent of his discomfort sinus bradycardia.  Per my chart review, including review of most recent rounding hospitalist progress note,  ***      Shawna Pigg, DO Hospitalist

## 2023-08-06 NOTE — Progress Notes (Signed)
IV fluid order and rate confirmed with MD Arlean Hopping; MD [facility] aware.

## 2023-08-06 NOTE — Consult Note (Signed)
Cardiology Consultation   Patient ID: Shawna [name] MRN: [id number]; DOB: 1941/04/20  Admit date: 08/05/2023 Date of Consult: 08/06/2023  PCP:  [facility]   [facility] Providers Cardiologist:  Dina Rich, MD   {  Patient Profile:   Shawna [name] is a 83 y.o. female with a hx of ESRD on hemodialysis, CAD s/p CABG, HFrEF, diabetes, hypertension, atrial fibrillation not on anticoagulation, GERD, history of TIA who is being seen 08/06/2023 for the evaluation of bradycardia at the request of Dr. Myriam Forehand.  History of Present Illness:   [name] is an 83yo female with a past medical history of ESRD on hemodialysis, CAD s/p CABG, HFrEF, diabetes, hypertension, atrial fibrillation not on anticoagulation  - rate and rhythm managed by Amiodarone and Lopressor , GERD, history of TIA. She presented to [facility] ED via EMS from [facility] on 08/05/2023 because after receiving about "half of her dialysis" session, she became bradycardic with rates ranging from 35-40 bpm. When she presented to the ED she was lethargic and noted fatigue with exertion. She did not note any other symptoms. She was not otherwise symptomatic with her bradycardia.   After consulting with nephrology they decided it was best to transfer patient the [facility] for dialysis. She was transferred over and then admitted to medicine. Her EKG showed sinus bradycardia, HR 39 bpm with previously noted LBBB. BMP revealed mild hyponatremia at 131, creatinine at 4.90, eGFR of 8, CBC showed hemoglobin of 6.3 (baseline 8-9).Primary team ordered a non-urgent transfusion of 1 unit PRBC over 3 hours.  Nephrology and cardiology have been consulted.   Patient was seen and examined at her bedside. He husband was at her bedside. She was sleep when I arrived and opened her eyes to voice. She denies any chest pain, shortness of breath, fatigue or dizziness.   Past Medical History:  Diagnosis Date   Acute on chronic  respiratory failure with hypoxia (HCC) 10/10/2016   Anxiety    Arthritis    AVM (arteriovenous malformation) of colon    CAD (coronary artery disease)    a. s/p CABG in 2013 b. DES to D1 in 10/2016. c. cath in 07/2018 showing patent grafts with occlusion of D1 at prior stent site and progression of PDA disease --> medical management recommended   Carotid artery disease (HCC)    a. 60-79% LICA, 03/2012    Chronic bronchitis (HCC)    Chronic HFrEF (heart failure with reduced ejection fraction) (HCC)    Colon cancer (HCC) 1992   Esophageal stricture    ESRD on hemodialysis (HCC)    ESRD due to HTN, started dialysis 2011 and gets HD at [facility] with Dr Fausto Skillern on MWF schedule.  Access is LUA AVF as of Sept 2014.    GERD (gastroesophageal reflux disease)    High cholesterol 12/2011   History of blood transfusion 07/2011; 12/2011; 01/2012 X 2; 04/2012   History of gout    History of lower GI bleeding    Hypertension    Iron deficiency anemia    Jugular vein occlusion, right (HCC)    Mitral regurgitation    a. Moderate by echo, 02/2012   Mitral valve disease    NSVT (nonsustained ventricular tachycardia) (HCC)    Ovarian cancer (HCC) 1992   PAF (paroxysmal atrial fibrillation) (HCC)    Pneumonia ~ 2009   PUD (peptic ulcer disease)    TIA (transient ischemic attack)    Tricuspid valve disease  Past Surgical History:  Procedure Laterality Date   A/V FISTULAGRAM Left 10/04/2022   Procedure: A/V Fistulagram;  Surgeon: Renford Dills, MD;  Location: [facility] INVASIVE CV LAB;  Service: Cardiovascular;  Laterality: Left;   A/V SHUNTOGRAM Left 03/19/2019   Procedure: A/V SHUNTOGRAM;  Surgeon: Renford Dills, MD;  Location: [facility] INVASIVE CV LAB;  Service: Cardiovascular;  Laterality: Left;   ABDOMINAL HYSTERECTOMY  1992   APPENDECTOMY  06/1990   AV FISTULA PLACEMENT  07/2009   left upper arm   AV FISTULA PLACEMENT Right 09/06/2016   Procedure: RIGHT FOREARM ARTERIOVENOUS (AV) GRAFT;   Surgeon: Sherren Kerns, MD;  [facility] OR;  Service: Vascular;  Laterality: Right;   AV FISTULA PLACEMENT N/A 02/24/2017   Procedure: INSERTION OF ARTERIOVENOUS (AV) GORE-TEX GRAFT ARM (BRACHIAL AXILLARY);  Surgeon: Renford Dills, MD;  Location: [facility] ORS;  Service: Vascular;  Laterality: N/A;   AVGG REMOVAL Right 09/06/2016   Procedure: REMOVAL OF Right Arm ARTERIOVENOUS GORETEX GRAFT and Vein Patch angioplasty of brachial artery;  Surgeon: Chuck Hint, MD;  Location: [facility] OR;  Service: Vascular;  Laterality: Right;   BIOPSY  09/26/2019   Procedure: BIOPSY;  Surgeon: Malissa Hippo, MD;  [facility];  Service: Endoscopy;;   BIOPSY  11/01/2022   Procedure: BIOPSY;  Surgeon: Dolores Frame, MD;  [facility];  Service: Gastroenterology;;   COLON RESECTION  1992   COLON SURGERY     COLONOSCOPY N/A 03/09/2019   Procedure: COLONOSCOPY;  Surgeon: Malissa Hippo, MD;  [facility];  Service: Endoscopy;  Laterality: N/A;   COLONOSCOPY N/A 08/11/2022   Procedure: COLONOSCOPY;  Surgeon: Beverley Fiedler, MD;  Location: [facility] ENDOSCOPY;  Service: Gastroenterology;  Laterality: N/A;   COLONOSCOPY WITH PROPOFOL N/A 06/08/2021   Procedure: COLONOSCOPY WITH PROPOFOL;  Surgeon: Dolores Frame, MD;  [facility];  Service: Gastroenterology;  Laterality: N/A;  9:05 /Patient is on dialysis Mon Wed Fri   CORONARY ANGIOPLASTY WITH STENT PLACEMENT  12/15/11   "2"   CORONARY ANGIOPLASTY WITH STENT PLACEMENT  y/2013   "1; makes total of 3" (05/02/2012)   CORONARY ARTERY BYPASS GRAFT  06/13/2012   Procedure: CORONARY ARTERY BYPASS GRAFTING (CABG);  Surgeon: Delight Ovens, MD;  Location: [facility] OR;  Service: Open Heart Surgery;  Laterality: N/A;  cabg x four;  using left internal mammary artery, and left leg greater saphenous vein harvested endoscopically   CORONARY STENT INTERVENTION N/A 10/13/2016   Procedure: Coronary Stent Intervention;   Surgeon: Lennette Bihari, MD;  [facility];  Service: Cardiovascular;  Laterality: N/A;   DIALYSIS/PERMA CATHETER REMOVAL N/A 04/18/2017   Procedure: DIALYSIS/PERMA CATHETER REMOVAL;  Surgeon: Renford Dills, MD;  Location: [facility] INVASIVE CV LAB;  Service: Cardiovascular;  Laterality: N/A;   DILATION AND CURETTAGE OF UTERUS     ENTEROSCOPY N/A 06/08/2021   Procedure: PUSH ENTEROSCOPY;  Surgeon: Dolores Frame, MD;  [facility];  Service: Gastroenterology;  Laterality: N/A;   ENTEROSCOPY N/A 11/01/2022   Procedure: ENTEROSCOPY;  Surgeon: Dolores Frame, MD;  [facility];  Service: Gastroenterology;  Laterality: N/A;   ENTEROSCOPY N/A 12/01/2022   Procedure: ENTEROSCOPY;  Surgeon: Benancio Deeds, MD;  Location: [facility] ENDOSCOPY;  Service: Gastroenterology;  Laterality: N/A;   ESOPHAGOGASTRODUODENOSCOPY  01/20/2012   Procedure: ESOPHAGOGASTRODUODENOSCOPY (EGD);  Surgeon: Meryl Dare, MD,FACG;  Location: [facility] ENDOSCOPY;  Service: Endoscopy;  Laterality: N/A;  ESOPHAGOGASTRODUODENOSCOPY N/A 03/26/2013   Procedure: ESOPHAGOGASTRODUODENOSCOPY (EGD);  Surgeon: Hilarie Fredrickson, MD;  Location: [facility] ENDOSCOPY;  Service: Endoscopy;  Laterality: N/A;   ESOPHAGOGASTRODUODENOSCOPY N/A 04/30/2015   Procedure: ESOPHAGOGASTRODUODENOSCOPY (EGD);  Surgeon: Malissa Hippo, MD;  [facility];  Service: Endoscopy;  Laterality: N/A;  1pm - moved to 10/20 @ 1:10   ESOPHAGOGASTRODUODENOSCOPY N/A 07/29/2016   Procedure: ESOPHAGOGASTRODUODENOSCOPY (EGD);  Surgeon: Ruffin Frederick, MD;  Location: [facility] ENDOSCOPY;  Service: Gastroenterology;  Laterality: N/A;  enteroscopy   ESOPHAGOGASTRODUODENOSCOPY N/A 09/26/2019   Procedure: ESOPHAGOGASTRODUODENOSCOPY (EGD);  Surgeon: Malissa Hippo, MD;  [facility];  Service: Endoscopy;  Laterality: N/A;  1250   ESOPHAGOGASTRODUODENOSCOPY N/A 08/11/2022   Procedure: ESOPHAGOGASTRODUODENOSCOPY (EGD);   Surgeon: Beverley Fiedler, MD;  Location: [facility] ENDOSCOPY;  Service: Gastroenterology;  Laterality: N/A;   ESOPHAGOGASTRODUODENOSCOPY (EGD) WITH PROPOFOL N/A 02/05/2021   Procedure: ESOPHAGOGASTRODUODENOSCOPY (EGD) WITH PROPOFOL;  Surgeon: Lanelle Bal, DO;  [facility];  Service: Endoscopy;  Laterality: N/A;   ESOPHAGOGASTRODUODENOSCOPY (EGD) WITH PROPOFOL N/A 08/27/2022   Procedure: ESOPHAGOGASTRODUODENOSCOPY (EGD) WITH PROPOFOL;  Surgeon: Lanelle Bal, DO;  [facility];  Service: Endoscopy;  Laterality: N/A;   GIVENS CAPSULE STUDY N/A 03/07/2019   Procedure: GIVENS CAPSULE STUDY;  Surgeon: Malissa Hippo, MD;  [facility];  Service: Endoscopy;  Laterality: N/A;  7:30   GIVENS CAPSULE STUDY N/A 04/22/2021   Procedure: GIVENS CAPSULE STUDY;  Surgeon: Malissa Hippo, MD;  [facility];  Service: Endoscopy;  Laterality: N/A;  7:30   GIVENS CAPSULE STUDY N/A 08/27/2022   Procedure: GIVENS CAPSULE STUDY;  Surgeon: Lanelle Bal, DO;  [facility];  Service: Endoscopy;  Laterality: N/A;   HOT HEMOSTASIS N/A 08/11/2022   Procedure: HOT HEMOSTASIS (ARGON PLASMA COAGULATION/BICAP);  Surgeon: Beverley Fiedler, MD;  Location: [facility] ENDOSCOPY;  Service: Gastroenterology;  Laterality: N/A;   HOT HEMOSTASIS N/A 12/01/2022   Procedure: HOT HEMOSTASIS (ARGON PLASMA COAGULATION/BICAP);  Surgeon: Benancio Deeds, MD;  Location: [facility] ENDOSCOPY;  Service: Gastroenterology;  Laterality: N/A;   INSERTION OF DIALYSIS CATHETER N/A 10/05/2020   Procedure: ABORTED TUNNELED DIALYSIS CATHETER PLACEMENT RIGHT INTERNAL JUGULAR VEIN ;  Surgeon: Lucretia Roers, MD;  Location: [facility]S;  Service: General;  Laterality: N/A;   INTRAOPERATIVE TRANSESOPHAGEAL ECHOCARDIOGRAM  06/13/2012   Procedure: INTRAOPERATIVE TRANSESOPHAGEAL ECHOCARDIOGRAM;  Surgeon: Delight Ovens, MD;  Location: [facility] OR;  Service: Open Heart Surgery;  Laterality: N/A;   IR DIALY SHUNT INTRO  NEEDLE/INTRACATH INITIAL W/IMG LEFT Left 10/06/2020   IR FLUORO GUIDE CV LINE RIGHT  06/17/2020   IR FLUORO GUIDE CV LINE RIGHT  11/12/2022   IR GENERIC HISTORICAL  07/26/2016   IR FLUORO GUIDE CV LINE RIGHT 07/26/2016 Berdine Dance, MD [facility]   IR GENERIC HISTORICAL  07/26/2016   IR US GUIDE VASC ACCESS RIGHT 07/26/2016 Berdine Dance, MD [facility]   IR GENERIC HISTORICAL  08/02/2016   IR US GUIDE VASC ACCESS RIGHT 08/02/2016 Berdine Dance, MD [facility]   IR GENERIC HISTORICAL  08/02/2016   IR FLUORO GUIDE CV LINE RIGHT 08/02/2016 Berdine Dance, MD [facility]   IR RADIOLOGY PERIPHERAL GUIDED IV START  03/28/2017   IR REMOVAL TUN CV CATH W/O FL  08/11/2020   IR REMOVAL TUN CV CATH W/O FL  11/15/2022   IR THROMBECTOMY AV FISTULA W/THROMBOLYSIS INC/SHUNT/IMG LEFT Left 06/17/2020   IR US GUIDE VASC ACCESS LEFT  06/17/2020  IR US GUIDE VASC ACCESS RIGHT  03/28/2017   IR US GUIDE VASC ACCESS RIGHT  06/17/2020   IR US GUIDE VASC ACCESS RIGHT  11/12/2022   LEFT HEART CATH AND CORONARY ANGIOGRAPHY N/A 09/20/2016   Procedure: Left Heart Cath and Coronary Angiography;  Surgeon: Lyn Records, MD;  Location: [facility] INVASIVE CV LAB;  Service: Cardiovascular;  Laterality: N/A;   LEFT HEART CATH AND CORS/GRAFTS ANGIOGRAPHY N/A 10/13/2016   Procedure: Left Heart Cath and Cors/Grafts Angiography;  Surgeon: Lennette Bihari, MD;  [facility];  Service: Cardiovascular;  Laterality: N/A;   LEFT HEART CATH AND CORS/GRAFTS ANGIOGRAPHY N/A 07/13/2018   Procedure: LEFT HEART CATH AND CORS/GRAFTS ANGIOGRAPHY;  Surgeon: Swaziland, Peter M, MD;  Location: [facility] INVASIVE CV LAB;  Service: Cardiovascular;  Laterality: N/A;   LEFT HEART CATH AND CORS/GRAFTS ANGIOGRAPHY N/A 07/22/2021   Procedure: LEFT HEART CATH AND CORS/GRAFTS ANGIOGRAPHY;  Surgeon: Runell Gess, MD;  [facility] CV LAB;  Service: Cardiovascular;  Laterality: N/A;   LEFT HEART CATHETERIZATION WITH CORONARY ANGIOGRAM N/A 12/15/2011    Procedure: LEFT HEART CATHETERIZATION WITH CORONARY ANGIOGRAM;  Surgeon: Kathleene Hazel, MD;  Location: [facility] CATH LAB;  Service: Cardiovascular;  Laterality: N/A;   LEFT HEART CATHETERIZATION WITH CORONARY ANGIOGRAM N/A 01/10/2012   Procedure: LEFT HEART CATHETERIZATION WITH CORONARY ANGIOGRAM;  Surgeon: Peter M Swaziland, MD;  Location: [facility] CATH LAB;  Service: Cardiovascular;  Laterality: N/A;   LEFT HEART CATHETERIZATION WITH CORONARY ANGIOGRAM N/A 06/08/2012   Procedure: LEFT HEART CATHETERIZATION WITH CORONARY ANGIOGRAM;  Surgeon: Kathleene Hazel, MD;  Location: [facility] CATH LAB;  Service: Cardiovascular;  Laterality: N/A;   LEFT HEART CATHETERIZATION WITH CORONARY/GRAFT ANGIOGRAM N/A 12/10/2013   Procedure: LEFT HEART CATHETERIZATION WITH Isabel Caprice;  Surgeon: Corky Crafts, MD;  Location: [facility] CATH LAB;  Service: Cardiovascular;  Laterality: N/A;   OVARY SURGERY     ovarian cancer   POLYPECTOMY  03/09/2019   Procedure: POLYPECTOMY;  Surgeon: Malissa Hippo, MD;  [facility];  Service: Endoscopy;;  cecal    POLYPECTOMY N/A 09/26/2019   Procedure: DUODENAL POLYPECTOMY;  Surgeon: Malissa Hippo, MD;  [facility];  Service: Endoscopy;  Laterality: N/A;   POLYPECTOMY  08/11/2022   Procedure: POLYPECTOMY;  Surgeon: Beverley Fiedler, MD;  Location: [facility] ENDOSCOPY;  Service: Gastroenterology;;   REVISION OF ARTERIOVENOUS GORETEX GRAFT N/A 02/24/2017   Procedure: REVISION OF ARTERIOVENOUS GORETEX GRAFT (RESECTION);  Surgeon: Renford Dills, MD;  Location: [facility] ORS;  Service: Vascular;  Laterality: N/A;   REVISON OF ARTERIOVENOUS FISTULA Left 06/19/2020   Procedure: REVISION OF LEFT UPPER ARM AV GRAFT WITH INTERPOSITION JUMP GRAFT USING GORE LIMB;  Surgeon: Cephus Shelling, MD;  Location: [facility] OR;  Service: Vascular;  Laterality: Left;   SHUNTOGRAM N/A 10/15/2013   Procedure: Fistulogram;  Surgeon: Nada Libman, MD;  Location: [facility] CATH LAB;  Service:  Cardiovascular;  Laterality: N/A;   SUBMUCOSAL TATTOO INJECTION  11/01/2022   Procedure: SUBMUCOSAL TATTOO INJECTION;  Surgeon: Dolores Frame, MD;  [facility];  Service: Gastroenterology;;   THROMBECTOMY / ARTERIOVENOUS GRAFT REVISION  2011   left upper arm   TUBAL LIGATION  1980's   UPPER EXTREMITY ANGIOGRAPHY Bilateral 12/06/2016   Procedure: Upper Extremity Angiography;  Surgeon: Renford Dills, MD;  Location: [facility] INVASIVE CV LAB;  Service: Cardiovascular;  Laterality: Bilateral;   UPPER EXTREMITY INTERVENTION Left 06/06/2017   Procedure: UPPER EXTREMITY INTERVENTION;  Surgeon: Renford Dills, MD;  Location: [facility] INVASIVE CV LAB;  Service: Cardiovascular;  Laterality: Left;   UPPER EXTREMITY VENOGRAPHY Left 10/04/2022   Procedure: UPPER EXTREMITY VENOGRAPHY;  Surgeon: Renford Dills, MD;  Location: [facility] INVASIVE CV LAB;  Service: Cardiovascular;  Laterality: Left;    Home Medications:  Prior to Admission medications   Medication Sig Start Date End Date Taking? Authorizing Provider  amiodarone (PACERONE) 200 MG tablet Take 200 mg by mouth daily. 05/31/23  Yes [provider]  calcitRIOL (ROCALTROL) 0.25 MCG capsule Take 7 capsules (1.75 mcg total) by mouth every Monday, Wednesday, and Friday with hemodialysis. 12/21/22  Yes Lanae Boast, MD  cinacalcet (SENSIPAR) 30 MG tablet Take 1 tablet (30 mg total) by mouth every Monday, Wednesday, and Friday with hemodialysis. 12/19/22  Yes Lanae Boast, MD  cyanocobalamin (VITAMIN B12) 1000 MCG/ML injection Inject 1,000 mcg into the muscle every 30 (thirty) days.   Yes [provider]  cyclobenzaprine (FLEXERIL) 5 MG tablet Take 5 mg by mouth 3 (three) times daily as needed for muscle spasms.   Yes [provider]  folic acid (FOLVITE) 1 MG tablet Take 1 mg by mouth daily. 05/24/22  Yes [provider]  gabapentin (NEURONTIN) 100 MG capsule Take 1 capsule (100 mg total) by mouth every  evening. 06/05/23 08/05/23 Yes Dorcas Carrow, MD  hydrALAZINE (APRESOLINE) 25 MG tablet Take 1 tablet (25 mg total) by mouth 2 (two) times daily. Hold for sbp <110 12/27/22  Yes Kc, Dayna Barker, MD  hydrOXYzine (ATARAX) 25 MG tablet Take 25 mg by mouth every 6 (six) hours as needed for itching. 06/06/22  Yes [provider]  levETIRAcetam (KEPPRA) 500 MG tablet Take 1 tablet (500 mg total) by mouth daily. 11/15/22  Yes Leroy Sea, MD  levothyroxine (SYNTHROID) 75 MCG tablet Take 1 tablet (75 mcg total) by mouth daily at 6 (six) AM. 02/02/23  Yes Sherryll Burger, Pratik D, DO  metoprolol tartrate (LOPRESSOR) 25 MG tablet Take 0.5 tablets (12.5 mg total) by mouth 2 (two) times daily. Patient taking differently: Take 25 mg by mouth 2 (two) times daily. 02/01/23 08/05/23 Yes Shah, Pratik D, DO  multivitamin (RENA-VIT) TABS tablet Take 1 tablet by mouth daily.   Yes [provider]  nitroGLYCERIN (NITROSTAT) 0.4 MG SL tablet Place 0.4 mg under the tongue every 5 (five) minutes as needed for chest pain.   Yes [provider]  octreotide (SANDOSTATIN LAR) 20 MG injection Inject 20 mg into the muscle every 28 (twenty-eight) days. 11/15/22  Yes Leroy Sea, MD  omeprazole (PRILOSEC) 40 MG capsule Take 40 mg by mouth daily. 05/31/23  Yes [provider]  ondansetron (ZOFRAN-ODT) 4 MG disintegrating tablet Take 4 mg by mouth every 8 (eight) hours as needed for nausea or vomiting. 05/22/23  Yes [provider]  rosuvastatin (CRESTOR) 10 MG tablet Take 1 tablet by mouth once daily 06/06/22  Yes Strader, Lennart Pall, PA-C    Inpatient Medications: Scheduled Meds:  [START ON 08/07/2023] calcitRIOL  1.75 mcg Oral Q M,W,F-HD   Chlorhexidine Gluconate Cloth  6 each Topical Daily   [START ON 08/07/2023] cinacalcet  30 mg Oral Q M,W,F-HD   gabapentin  100 mg Oral QPM   heparin  5,000 Units Subcutaneous Q8H   levETIRAcetam  500 mg Oral Daily   levothyroxine  125 mcg Oral Q0600    magnesium oxide  800 mg Oral Once   pantoprazole  40 mg Oral Daily   rosuvastatin  10 mg Oral Daily   Continuous Infusions:  vancomycin     PRN Meds: atropine, cyclobenzaprine, ondansetron **OR** ondansetron (ZOFRAN) IV  Allergies:    Allergies  Allergen Reactions   Amlodipine Swelling   Aspirin Other (See Comments)    High Doses Mess up her stomach; "makes my bowels have blood in them". Takes 81 mg EC Aspirin    Nitrofurantoin Hives   Ranexa [Ranolazine] Other (See Comments)    Myoclonus-hospitalized    Bactrim [Sulfamethoxazole-Trimethoprim] Rash   Iodinated Contrast Media Itching and Other (See Comments)   Iron Itching and Other (See Comments)    "they gave me iron in dialysis; had to give me Benadryl cause I had to have the iron" (05/02/2012)   Tylenol [Acetaminophen] Itching and Other (See Comments)    Makes her feet on fire per pt - can tolerate per pt   Gabapentin Other (See Comments)    Unknown reaction   Sucroferric Oxyhydroxide Other (See Comments)    Unknown   Levaquin [Levofloxacin In D5w] Rash   Levofloxacin Rash and Other (See Comments)   Plavix [Clopidogrel Bisulfate] Rash   Protonix [Pantoprazole Sodium] Rash   Venofer [Ferric Oxide] Itching and Other (See Comments)    Patient reports using Benadryl prior to doses as [facility]   Social History:   Social History   Socioeconomic History   Marital status: Married    Spouse name: Ajiah Mcglinn   Number of children: 1   Years of education: Not on file   Highest education level: GED or equivalent  Occupational History   Occupation: retired- Architectural technologist- sewed  Tobacco Use   Smoking status: Never   Smokeless tobacco: Never  Vaping Use   Vaping status: Never Used  Substance and Sexual Activity   Alcohol use: No    Alcohol/week: 0.0 standard drinks of alcohol   Drug use: No   Sexual activity: Not Currently    Birth control/protection: Surgical  Other Topics Concern   Not on file   Social History Narrative   Lives in [place], [place] with husband.  Dialysis pt - mwf.   Social Drivers of Corporate investment banker Strain: Low Risk  (12/14/2018)   Overall Financial Resource Strain (CARDIA)    Difficulty of Paying Living Expenses: Not very hard  Food Insecurity: Patient Declined (08/06/2023)   Hunger Vital Sign    Worried About Running Out of Food in the Last Year: Patient declined    Ran Out of Food in the Last Year: Patient declined  Transportation Needs: Patient Declined (08/06/2023)   PRAPARE - Administrator, Civil Service (Medical): Patient declined    Lack of Transportation (Non-Medical): Patient declined  Physical Activity: Unknown (12/14/2018)   Exercise Vital Sign    Days of Exercise per Week: Patient declined    Minutes of Exercise per Session: Patient declined  Stress: No Stress Concern Present (12/14/2018)   [employer] of Occupational Health - Occupational Stress Questionnaire    Feeling of Stress : Only a little  Social Connections: Patient Declined (08/06/2023)   Social Connection and Isolation Panel [NHANES]    Frequency of Communication with Friends and Family: Patient declined    Frequency of Social Gatherings with Friends and Family: Patient declined    Attends Religious Services: Patient declined    Database administrator or Organizations: Patient declined    Attends Banker Meetings: Patient declined    Marital Status: Patient declined  Intimate  Partner Violence: Unknown (08/06/2023)   Humiliation, Afraid, Rape, and Kick questionnaire    Fear of Current or Ex-Partner: Patient declined    Emotionally Abused: Patient declined    Physically Abused: No    Sexually Abused: Patient declined    Family History:   Family History  Problem Relation Age of Onset   Heart disease Mother        Heart Disease before age 11   Hyperlipidemia Mother    Hypertension Mother    Diabetes Mother    Heart attack Mother    Heart disease  Father        Heart Disease before age 66   Hyperlipidemia Father    Hypertension Father    Diabetes Father    Diabetes Sister    Hypertension Sister    Diabetes Brother    Hyperlipidemia Brother    Heart attack Brother    Hypertension Sister    Heart attack Brother    Colon cancer Child 43   Other Other        noncontributory for early CAD   Esophageal cancer Neg Hx    Liver disease Neg Hx    Kidney disease Neg Hx    Colon polyps Neg Hx     ROS:  Please see the history of present illness.  All other ROS reviewed and negative.     Physical Exam/Data:   Vitals:   08/06/23 0653 08/06/23 0936 08/06/23 1136 08/06/23 1505  BP: (!) 161/53 (!) 179/59 (!) 129/37 (!) 142/52  Pulse: (!) 43 (!) 46 (!) 43 (!) 44  Resp: 16 16 20 20   Temp: 97.9 F (36.6 C) 98 F (36.7 C) 98.7 F (37.1 C) 98.3 F (36.8 C)  TempSrc: Oral Oral Oral Oral  SpO2: 97% 98% 91% 98%  Weight:      Height:        Intake/Output Summary (Last 24 hours) at 08/06/2023 1612 Last data filed at 08/06/2023 0933 Gross per 24 hour  Intake 384 ml  Output --  Net 384 ml      08/05/2023    1:30 PM 07/11/2023    8:05 PM 07/11/2023    4:15 PM  Last 3 Weights  Weight (lbs) 120 lb 121 lb 11.1 oz 126 lb 8.7 oz  Weight (kg) 54.432 kg 55.2 kg 57.4 kg     Body mass index is 23.44 kg/m.  General:  Well nourished, well developed, in no acute distress HEENT: normal Neck: no JVD Left chest wall with dialysis port Vascular: No carotid bruits; Distal pulses 2+ bilaterally Cardiac:  normal S1, S2; RRR; no murmur  Lungs:  clear to auscultation bilaterally, no wheezing, rhonchi or rales  Abd: soft, nontender, no hepatomegaly  Ext: no edema Musculoskeletal:  No deformities, BUE and BLE strength normal and equal Skin: warm and dry  Neuro:  CNs 2-12 intact, no focal abnormalities noted Psych:  Normal affect   EKG:  The EKG performed yesterday was personally reviewed and demonstrates: Sinus bradycardia HR  39bpm with  underlying left bundle branch block.   Telemetry:  Telemetry was personally reviewed and demonstrates:  sinus bradycardia  Relevant CV Studies: Echo and LHC in 07/2021  Laboratory Data:  High Sensitivity Troponin:   Recent Labs  Lab 08/05/23 1441 08/05/23 1540  TROPONINIHS 40* 37*     Chemistry Recent Labs  Lab 08/05/23 1441 08/06/23 0213  NA 135 131*  K 3.6 3.6  CL 97* 98  CO2 24 22  GLUCOSE 104*  113*  BUN 29* 30*  CREATININE 4.33* 4.90*  CALCIUM 8.5* 8.3*  MG  --  1.4*  GFRNONAA 10* 8*  ANIONGAP 14 11    No results for input(s): "PROT", "ALBUMIN", "AST", "ALT", "ALKPHOS", "BILITOT" in the last 168 hours. Lipids No results for input(s): "CHOL", "TRIG", "HDL", "LABVLDL", "LDLCALC", "CHOLHDL" in the last 168 hours.  Hematology Recent Labs  Lab 08/05/23 1441 08/06/23 0213  WBC 4.2 5.9  RBC 1.98* 1.63*  HGB 7.5* 6.3*  HCT 22.9* 18.7*  MCV 115.7* 114.7*  MCH 37.9* 38.7*  MCHC 32.8 33.7  RDW Not Measured 26.0*  PLT 162 183   Thyroid  Recent Labs  Lab 08/05/23 1441  TSH 9.781*  FREET4 1.03    BNPNo results for input(s): "BNP", "PROBNP" in the last 168 hours.  DDimer No results for input(s): "DDIMER" in the last 168 hours.  Radiology/Studies:  DG Chest Portable 1 View Result Date: 08/05/2023 CLINICAL DATA:  Fluid overload EXAM: PORTABLE CHEST 1 VIEW COMPARISON:  Chest radiograph dated 05/31/2023 FINDINGS: Lines/tubes: Left internal jugular venous catheter tip projects over the right atrium. Left axillary stent graft. Lungs: Low lung volumes. Diffuse bilateral interstitial opacities. Bibasilar patchy opacities. Pleura: Small bilateral pleural effusions.  No pneumothorax. Heart/mediastinum: Similar enlarged cardiomediastinal silhouette. Bones: Median sternotomy wires are nondisplaced. Degenerative changes of the right shoulder. IMPRESSION: 1. Cardiomegaly with pulmonary edema and small bilateral pleural effusions. 2. Bibasilar patchy opacities, likely atelectasis.  Aspiration or pneumonia can be considered in the appropriate clinical setting. Electronically Signed   By: Agustin Cree M.D.   On: 08/05/2023 14:32   Assessment and Plan:   Sinus bradycardia  Left bundle branch Paroxysmal atrial fibrillation/flutter Chronic HFrEF Hypertension  CAD s/p CABG ESRD on hemodialysis   Currently her heart rate is in the 40-45 bpm on telemetry - and she denies any symptoms at this time.  She certainly does have conduction disease but is also currently on av nodal blockers as well as amiodarone.  Would recommend stopping both her lopressor and her amiodarone at this time.  Will follow closely.   CAD - denies any angina symptoms. No need for further evaluation at this time'   PAF - she is sinus brady with underlying LBBB at this time - plan as list above   Chronic HFrEF - clinically appears to be euvolemic  ESRD - HD per Nephrology  Risk Assessment/Risk Scores:        CHA2DS2-VASc Score =     This indicates a  % annual risk of stroke. The patient's score is based upon:          For questions or updates, please contact [facility] Please consult [email or web address] for contact info under    Signed, Olena Leatherwood, PA-C  08/06/2023 4:12 PM

## 2023-08-06 NOTE — Plan of Care (Signed)

## 2023-08-06 NOTE — Care Management CC44 (Signed)
Condition Code 44 Documentation Completed  Patient Details  Name: [name] MRN: [id number] Date of Birth: 01-20-1941   Condition Code 44 given:  Yes Patient signature on Condition Code 44 notice:    Documentation of 2 MD's agreement:    Code 44 added to claim:       Deatra Robinson, LCSW 08/06/2023, 5:42 PM

## 2023-08-06 NOTE — Progress Notes (Addendum)
Progress Note    Shawna [name]  ZOX:[id number] DOB: 24-Dec-1940  DOA: 08/05/2023 PCP: [facility]      Brief Narrative:    Medical records reviewed and are as summarized below:  Shawna [name] is a 83 y.o. female with medical history significant of end-stage renal disease on dialysis, coronary artery disease status post CABG and drug-eluting stents, heart failure with reduced EF, diabetes, hypertension, GERD, atrial fibrillation, on metoprolol and amiodarone but not on anticoagulants.  She had missed hemodialysis a day prior to admission because of loose stools.  She went for hemodialysis the following day (on the day of admission).  During hemodialysis, patient was found to be bradycardic with heart rate in the mid 30s.  Dialysis was terminated prematurely and she was transported to the emergency department for further management.  She complained of fatigue.  No other symptoms reported.       Assessment/Plan:   Principal Problem:   Bradycardia Active Problems:   End-stage renal disease on hemodialysis (HCC)   Diabetes mellitus type 2 in nonobese [facility])   Acquired hypothyroidism   Chronic combined systolic and diastolic CHF (congestive heart failure) (HCC)   CAD (coronary artery disease)   Atrial fibrillation, chronic (HCC)    Body mass index is 23.44 kg/m.   Sinus bradycardia, paroxysmal atrial fibrillation: Heart rate is still low.  Heart rate has been in the 40s today.  Heart rate was in the 30s on admission yesterday.  Metoprolol and amiodarone have been discontinued.  Consulted cardiologist to assist with management.  Laverda Page, NP, with cardiology was notified. She is not on long-term anticoagulation because of history of recurrent GI bleed.   Severe anemia: Hemoglobin dropped from 7.5-6.3.  S/p transfusion with 1 unit of PRBCs on 08/06/2023.  No evidence of bleeding at this time.  Repeat hemoglobin tomorrow.   ESRD: Follow-up with nephrologist  for hemodialysis   Hypomagnesemia: Magnesium was 1.4.  Replete with oral magnesium oxide because of lack of good peripheral IV access.  Prefer to replete magnesium at this time because of bradycardia.   Hypothyroidism: TSH 9.781, free T41.03.  Continue Synthroid   Comorbidities include seizure disorder on Keppra, history of recurrent GI bleed, CAD, hypertension, type 2 diabetes mellitus    Diet Order             Diet renal/carb modified with fluid restriction Diet-HS Snack? Nothing; Fluid restriction: 1500 mL Fluid; Room service appropriate? Yes; Fluid consistency: Thin  Diet effective now                            Consultants: Nephrologist Cardiologist  Procedures: None    Medications:    [START ON 08/07/2023] calcitRIOL  1.75 mcg Oral Q M,W,F-HD   Chlorhexidine Gluconate Cloth  6 each Topical Daily   [START ON 08/07/2023] cinacalcet  30 mg Oral Q M,W,F-HD   gabapentin  100 mg Oral QPM   heparin  5,000 Units Subcutaneous Q8H   levETIRAcetam  500 mg Oral Daily   levothyroxine  125 mcg Oral Q0600   pantoprazole  40 mg Oral Daily   rosuvastatin  10 mg Oral Daily   Continuous Infusions:   Anti-infectives (From admission, onward)    None              Family Communication/Anticipated D/C date and plan/Code Status   DVT prophylaxis: heparin injection 5,000 Units Start: 08/06/23 1400  Code Status: Full Code  Family Communication: None Disposition Plan: Discharge home   Status is: Inpatient Remains inpatient appropriate because: Bradycardia       Subjective:   Interval events noted.  She has no complaints of shortness of breath, palpitations, dizziness or chest pain.  No abdominal pain, bloody stools or hematemesis  Objective:    Vitals:   08/06/23 0653 08/06/23 0936 08/06/23 1136 08/06/23 1505  BP: (!) 161/53 (!) 179/59 (!) 129/37 (!) 142/52  Pulse: (!) 43 (!) 46 (!) 43 (!) 44  Resp: 16 16 20 20   Temp: 97.9 F (36.6 C)  98 F (36.7 C) 98.7 F (37.1 C) 98.3 F (36.8 C)  TempSrc: Oral Oral Oral Oral  SpO2: 97% 98% 91% 98%  Weight:      Height:       No data found.   Intake/Output Summary (Last 24 hours) at 08/06/2023 1534 Last data filed at 08/06/2023 1610 Gross per 24 hour  Intake 384 ml  Output --  Net 384 ml   Filed Weights   08/05/23 1330  Weight: 54.4 kg    Exam:  GEN: NAD SKIN: Warm and dry EYES: Pale but anicteric ENT: MMM CV: RRR PULM: CTA B ABD: soft, ND, NT, +BS CNS: AAO x 3, non focal EXT: No edema or tenderness        Data Reviewed:   I have personally reviewed following labs and imaging studies:  Labs: Labs show the following:   Basic Metabolic Panel: Recent Labs  Lab 08/05/23 1441 08/06/23 0213  NA 135 131*  K 3.6 3.6  CL 97* 98  CO2 24 22  GLUCOSE 104* 113*  BUN 29* 30*  CREATININE 4.33* 4.90*  CALCIUM 8.5* 8.3*  MG  --  1.4*   GFR Estimated Creatinine Clearance: 6.2 mL/min (A) (by C-G formula based on SCr of 4.9 mg/dL (H)). Liver Function Tests: No results for input(s): "AST", "ALT", "ALKPHOS", "BILITOT", "PROT", "ALBUMIN" in the last 168 hours. No results for input(s): "LIPASE", "AMYLASE" in the last 168 hours. No results for input(s): "AMMONIA" in the last 168 hours. Coagulation profile No results for input(s): "INR", "PROTIME" in the last 168 hours.  CBC: Recent Labs  Lab 08/05/23 1441 08/06/23 0213  WBC 4.2 5.9  HGB 7.5* 6.3*  HCT 22.9* 18.7*  MCV 115.7* 114.7*  PLT 162 183   Cardiac Enzymes: No results for input(s): "CKTOTAL", "CKMB", "CKMBINDEX", "TROPONINI" in the last 168 hours. BNP (last 3 results) No results for input(s): "PROBNP" in the last 8760 hours. CBG: No results for input(s): "GLUCAP" in the last 168 hours. D-Dimer: No results for input(s): "DDIMER" in the last 72 hours. Hgb A1c: No results for input(s): "HGBA1C" in the last 72 hours. Lipid Profile: No results for input(s): "CHOL", "HDL", "LDLCALC", "TRIG",  "CHOLHDL", "LDLDIRECT" in the last 72 hours. Thyroid function studies: Recent Labs    08/05/23 1441  TSH 9.781*   Anemia work up: No results for input(s): "VITAMINB12", "FOLATE", "FERRITIN", "TIBC", "IRON", "RETICCTPCT" in the last 72 hours. Sepsis Labs: Recent Labs  Lab 08/05/23 1441 08/06/23 0213  WBC 4.2 5.9    Microbiology No results found for this or any previous visit (from the past 240 hours).  Procedures and diagnostic studies:  DG Chest Portable 1 View Result Date: 08/05/2023 CLINICAL DATA:  Fluid overload EXAM: PORTABLE CHEST 1 VIEW COMPARISON:  Chest radiograph dated 05/31/2023 FINDINGS: Lines/tubes: Left internal jugular venous catheter tip projects over the right atrium. Left axillary stent graft.  Lungs: Low lung volumes. Diffuse bilateral interstitial opacities. Bibasilar patchy opacities. Pleura: Small bilateral pleural effusions.  No pneumothorax. Heart/mediastinum: Similar enlarged cardiomediastinal silhouette. Bones: Median sternotomy wires are nondisplaced. Degenerative changes of the right shoulder. IMPRESSION: 1. Cardiomegaly with pulmonary edema and small bilateral pleural effusions. 2. Bibasilar patchy opacities, likely atelectasis. Aspiration or pneumonia can be considered in the appropriate clinical setting. Electronically Signed   By: Agustin Cree M.D.   On: 08/05/2023 14:32               LOS: 1 day   Joscelyn Hardrick  [facility]   Pager on [email or web address]. If 7PM-7AM, please contact night-coverage at [email or web address]     08/06/2023, 3:34 PM

## 2023-08-07 DIAGNOSIS — I5022 Chronic systolic (congestive) heart failure: Secondary | ICD-10-CM | POA: Diagnosis not present

## 2023-08-07 DIAGNOSIS — I480 Paroxysmal atrial fibrillation: Secondary | ICD-10-CM

## 2023-08-07 DIAGNOSIS — R001 Bradycardia, unspecified: Secondary | ICD-10-CM | POA: Diagnosis not present

## 2023-08-07 LAB — CBC
HCT: 23.5 % — ABNORMAL LOW (ref 36.0–46.0)
Hemoglobin: 7.8 g/dL — ABNORMAL LOW (ref 12.0–15.0)
MCH: 36.6 pg — ABNORMAL HIGH (ref 26.0–34.0)
MCHC: 33.2 g/dL (ref 30.0–36.0)
MCV: 110.3 fL — ABNORMAL HIGH (ref 80.0–100.0)
Platelets: 173 10*3/uL (ref 150–400)
RBC: 2.13 MIL/uL — ABNORMAL LOW (ref 3.87–5.11)
RDW: 23.9 % — ABNORMAL HIGH (ref 11.5–15.5)
WBC: 4 10*3/uL (ref 4.0–10.5)
nRBC: 0 % (ref 0.0–0.2)

## 2023-08-07 LAB — BASIC METABOLIC PANEL
Anion gap: 12 (ref 5–15)
BUN: 43 mg/dL — ABNORMAL HIGH (ref 8–23)
CO2: 22 mmol/L (ref 22–32)
Calcium: 8.4 mg/dL — ABNORMAL LOW (ref 8.9–10.3)
Chloride: 101 mmol/L (ref 98–111)
Creatinine, Ser: 6.53 mg/dL — ABNORMAL HIGH (ref 0.44–1.00)
GFR, Estimated: 6 mL/min — ABNORMAL LOW (ref >60)
Glucose, Bld: 83 mg/dL (ref 70–99)
Potassium: 3.9 mmol/L (ref 3.5–5.1)
Sodium: 135 mmol/L (ref 135–145)

## 2023-08-07 LAB — HEPATITIS B SURFACE ANTIGEN: Hepatitis B Surface Ag: NONREACTIVE

## 2023-08-07 LAB — MAGNESIUM: Magnesium: 1.5 mg/dL — ABNORMAL LOW (ref 1.7–2.4)

## 2023-08-07 MED ORDER — SODIUM CHLORIDE 0.9 % IV SOLN: INTRAVENOUS | Status: DC

## 2023-08-07 MED ORDER — DARBEPOETIN ALFA 100 MCG/0.5ML IJ SOSY
100.0000 ug | PREFILLED_SYRINGE | INTRAMUSCULAR | Status: DC
  Administered 2023-08-07: 100 ug via SUBCUTANEOUS
  Filled 2023-08-07: qty 0.5

## 2023-08-07 MED ORDER — MAGNESIUM SULFATE 4 GM/100ML IV SOLN
4.0000 g | Freq: Once | INTRAVENOUS | Status: AC
  Administered 2023-08-07: 4 g via INTRAVENOUS
  Filled 2023-08-07: qty 100

## 2023-08-07 NOTE — TOC Initial Note (Addendum)
Transition of Care [facility]) - Initial/Assessment Note    Patient Details  Name: Shawna [name] MRN: [id number] Date of Birth: 08-Dec-1940  Transition of Care [facility]) CM/SW Contact:    Leone Haven, RN Phone Number: 08/07/2023, 2:44 PM  Clinical Narrative:                 From home with spouse, has PCP and insurance on file, states has no HH services in place at this time , has walker and cane at home.  States spouse will transport them home at [employer] and family is support system, states gets medications from [facility] and [facility].  Pta self ambulatory with walker.  NCM offered choice, she states really does not have a preference but she had [facility] last time and would like to try them. NCM made referral to [facility] , rep states to fax referral to [phone] and they will see if they are in network with her plan , some of their plans has changed with [facility],   NCM faxed information to [facility].  Awaiting to hear back from [facility] [phone].  Expected Discharge Plan: Home w Home Health Services Barriers to Discharge: Continued Medical Work up   Patient Goals and CMS Choice Patient states their goals for this hospitalization and ongoing recovery are:: return home CMS Medicare.gov Compare Post Acute Care list provided to:: Patient Choice offered to / list presented to : Patient      Expected Discharge Plan and Services In-house Referral: NA Discharge Planning Services: CM Consult Post Acute Care Choice: Home Health Living arrangements for the past 2 months: Single Family Home                 DME Arranged: N/A DME Agency: NA       HH Arranged: PT, OT HH Agency: [facility] Date HH Agency Contacted: 08/07/23 Time HH Agency Contacted: 1443 Representative spoke with at [facility] Agency: [facility] rep  Prior Living Arrangements/Services Living arrangements for the past 2 months: Single Family Home Lives with:: Spouse Patient language and need for interpreter reviewed::  Yes Do you feel safe going back to the place where you live?: Yes      Need for Family Participation in Patient Care: Yes (Comment) Care giver support system in place?: Yes (comment) Current home services: DME (walker, cane) Criminal Activity/Legal Involvement Pertinent to Current Situation/Hospitalization: No - Comment as needed  Activities of Daily Living   ADL Screening (condition at time of admission) Independently performs ADLs?: No Does the patient have a NEW difficulty with bathing/dressing/toileting/self-feeding that is expected to last >3 days?: Yes (Initiates electronic notice to provider for possible OT consult) Does the patient have a NEW difficulty with getting in/out of bed, walking, or climbing stairs that is expected to last >3 days?: Yes (Initiates electronic notice to provider for possible PT consult) Does the patient have a NEW difficulty with communication that is expected to last >3 days?: No Is the patient deaf or have difficulty hearing?: No Does the patient have difficulty seeing, even when wearing glasses/contacts?: No Does the patient have difficulty concentrating, remembering, or making decisions?: No  Permission Sought/Granted Permission sought to share information with : Case Manager Permission granted to share information with : Yes, Verbal Permission Granted              Emotional Assessment Appearance:: Appears stated age Attitude/Demeanor/Rapport: Engaged Affect (typically observed): Appropriate Orientation: : Oriented to Self, Oriented to Place, Oriented to  Time, Oriented to Situation Alcohol / Substance Use: Not Applicable Psych Involvement: No (comment)  Admission diagnosis:  Bradycardia [R00.1] Symptomatic bradycardia [R00.1] Patient Active Problem List   Diagnosis Date Noted   Bradycardia 08/05/2023   Generalized weakness 07/12/2023   Sinus bradycardia 07/11/2023   FTT (failure to thrive) in adult 06/04/2023   Symptomatic anemia  03/08/2023   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   Acute blood loss anemia 01/27/2023   Paroxysmal atrial flutter (HCC) 12/14/2022   Pressure injury of skin 12/01/2022   Rectal bleeding 11/30/2022   Generalized abdominal pain 11/10/2022   Ileus (HCC) 11/04/2022   Periumbilical abdominal pain 11/04/2022   Macrocytic anemia 10/29/2022   Anemia of chronic disease 10/29/2022   Upper GI bleed 10/29/2022   Iron deficiency anemia due to chronic blood loss 08/26/2022   Benign neoplasm of transverse colon 08/11/2022   Benign neoplasm of descending colon 08/11/2022   Benign neoplasm of sigmoid colon 08/11/2022   Angiodysplasia of duodenum 08/11/2022   Delirium 04/22/2022   History of arteriovenous malformation (AVM) 04/22/2022   Acute metabolic encephalopathy 11/09/2021   NSTEMI, initial episode of care (HCC) 10/19/2021   Acute on chronic blood loss anemia 09/09/2021   Ischemic cardiomyopathy    Atrial fibrillation, chronic (HCC) 05/07/2021   Neuropathy 04/23/2021   Anemia associated with chronic renal failure 04/13/2021   Left knee pain 03/15/2021   Moderate protein-calorie malnutrition (HCC) 02/27/2021   Hypokalemia 02/27/2021   COVID-19 virus infection 02/03/2021   Leukocytosis 02/03/2021   Acquired hypothyroidism 02/03/2021   Myositis 12/03/2020   Ataxia 12/02/2020   Diabetes mellitus type 2 in nonobese (HCC) 11/10/2020   Failure to thrive in adult 11/09/2020   Dialysis AV fistula malfunction, initial encounter (HCC)    Jugular vein occlusion, right (HCC)    Failure of surgically constructed arteriovenous fistula (HCC) 10/03/2020   Myoclonus 08/31/2020   Clotted renal dialysis AV graft, initial encounter (HCC)    Hemodialysis-associated hypotension    Irritable bowel syndrome 02/25/2020   Adenomatous duodenal polyp 09/10/2019   History of GI bleed 09/10/2019   Hand steal syndrome (HCC) 08/01/2017   CAD (coronary artery disease) 06/05/2017   Intestinal ischemia  (HCC)    Complication of vascular access for dialysis 03/19/2017   Preoperative clearance 01/25/2017   H/O non-ST elevation myocardial infarction (NSTEMI) 10/24/2016   Non-ST elevation (NSTEMI) myocardial infarction [facility])    Heme positive stool    Cardiac arrest [facility])    Palliative care encounter    Goals of care, counseling/discussion    Flash pulmonary edema (HCC) 04/06/2016   History of colon cancer 01/27/2016   History of ovarian cancer 01/27/2016   PAF (paroxysmal atrial fibrillation) (HCC) 10/14/2015   Malignant neoplasm of right ovary (HCC) 10/14/2015   Wide-complex tachycardia 09/08/2015   SVT (supraventricular tachycardia) (HCC) 09/08/2015   Essential hypertension    Dyspnea    Constipation 03/12/2013   Abdominal wall pain in left flank 03/12/2013   Occlusion and stenosis of carotid artery without mention of cerebral infarction 01/24/2013   Hx of CABG 07/05/2012   Carotid artery disease (HCC) 07/05/2012   Mitral regurgitation 06/12/2012   Non-STEMI (non-ST elevated myocardial infarction) (HCC) 06/08/2012   Chronic combined systolic and diastolic CHF (congestive heart failure) (HCC) 05/02/2012   AVM (arteriovenous malformation) of small bowel, acquired with hemorrhage 01/20/2012   GERD (gastroesophageal reflux disease) 01/09/2012   Mixed hyperlipidemia 01/05/2012   Atherosclerotic heart disease of native coronary artery without angina pectoris 12/16/2011   ESRD  on hemodialysis (HCC) 12/16/2011   Anxiety disorder 05/04/2011   End-stage renal disease on hemodialysis (HCC) 04/29/2011   Gout 04/29/2011   PCP:  [facility]:   [facility] [address] [place] - [address] [place] [address] Phone: [phone] Fax: [phone]  [facility] - [address] [place], [place] - [address] [place] [address] Phone: [phone] Fax: [phone]     Social Drivers of Health (SDOH) Social History: SDOH Screenings    Food Insecurity: Patient Declined (08/06/2023)  Housing: Patient Declined (08/06/2023)  Transportation Needs: Patient Declined (08/06/2023)  Utilities: Patient Declined (08/06/2023)  Financial Resource Strain: Low Risk  (12/14/2018)  Physical Activity: Unknown (12/14/2018)  Social Connections: Patient Declined (08/06/2023)  Stress: No Stress Concern Present (12/14/2018)  Tobacco Use: Low Risk  (08/06/2023)  Health Literacy: Adequate Health Literacy (08/01/2023)   [facility]  Recent Concern: Health Literacy - Inadequate Health Literacy (07/20/2023)   Received from [facility]   SDOH Interventions:     Readmission Risk Interventions    05/02/2023    2:17 PM 01/29/2023   11:08 AM 10/31/2022   12:45 PM  Readmission Risk Prevention Plan  Transportation Screening Complete Complete Complete  Medication Review (RN Care Manager) Complete Complete Complete  PCP or Specialist appointment within 3-5 days of discharge  Complete   HRI or Home Care Consult Complete Complete Complete  SW Recovery Care/Counseling Consult Complete Complete Complete  Palliative Care Screening Not Applicable Not Applicable Not Applicable  Skilled Nursing Facility Not Applicable Not Applicable Not Applicable

## 2023-08-07 NOTE — Progress Notes (Signed)
PROGRESS NOTE    Shawna [name]  ZOX:[id number] DOB: 1941-03-27 DOA: 08/05/2023 PCP: [facility]   Brief Narrative:  Shawna [name] is a 83 y.o. female with medical history significant of end-stage renal disease on dialysis, coronary artery disease status post CABG and drug-eluting stents, heart failure with reduced EF, diabetes, hypertension, GERD, atrial fibrillation, on metoprolol and amiodarone but not on anticoagulants.  She had missed hemodialysis a day prior to admission because of loose stools.  She went for hemodialysis the following day (on the day of admission).  During hemodialysis, patient was found to be bradycardic with heart rate in the mid 30s.  Dialysis was terminated prematurely and she was transported to the emergency department for further management.  She complained of fatigue.  No other symptoms reported.   Assessment & Plan:   Principal Problem:   Bradycardia Active Problems:   End-stage renal disease on hemodialysis (HCC)   Diabetes mellitus type 2 in nonobese [facility])   Acquired hypothyroidism   Chronic combined systolic and diastolic CHF (congestive heart failure) (HCC)   CAD (coronary artery disease)   Atrial fibrillation, chronic (HCC)  Sinus bradycardia, paroxysmal atrial fibrillation: Heart rate was in the 30s on admission. Metoprolol and amiodarone have been discontinued.  Consulted cardiologist to assist with management. She is not on long-term anticoagulation because of history of recurrent GI bleed.  Heart rate is gradually improving, patient remains asymptomatic.  Cardiology recommend discontinuing both of these medications at discharge and no intervention/pacemaker recommended and they signed off.   Severe anemia: Hemoglobin dropped from 7.5-6.3 on admission.  S/p transfusion with 1 unit of PRBCs on 08/06/2023.  No evidence of bleeding at this time.  Hemoglobin over 8.  Repeat hemoglobin tomorrow.   ESRD: Follow-up with nephrologist for  hemodialysis   Hypomagnesemia: Magnesium is still low, will replenish.   Hyperlipidemia: Continue statin.  Hypothyroidism: TSH 9.781, free T41.03.  Continue Synthroid   Comorbidities include seizure disorder on Keppra, history of recurrent GI bleed, CAD, hypertension, type 2 diabetes mellitus  Generalized weakness: Per husband, she has been very weak lately.  I have consulted PT OT.  DVT prophylaxis: heparin injection 5,000 Units Start: 08/06/23 1400   Code Status: Full Code  Family Communication:  None present at bedside.  Plan of care discussed with patient and her husband over the phone in length and he/she verbalized understanding and agreed with it.  Status is: Observation The patient will require care spanning > 2 midnights and should be moved to inpatient because: Heart rate improving but is still low, needs to be seen by PT OT.   Estimated body mass index is 22.44 kg/m as calculated from the following:   Height as of this encounter: 5' (1.524 m).   Weight as of this encounter: 52.1 kg.    Nutritional Assessment: Body mass index is 22.44 kg/m.Marland Kitchen Seen by dietician.  I agree with the assessment and plan as outlined below: Nutrition Status:        . Skin Assessment: I have examined the patient's skin and I agree with the wound assessment as performed by the wound care RN as outlined below:    Consultants:  Cardiology  Procedures:  None  Antimicrobials:  Anti-infectives (From admission, onward)    Start     Dose/Rate Route Frequency Ordered Stop   08/06/23 1700  vancomycin (VANCOREADY) IVPB 1250 mg/250 mL        1,250 mg 166.7 mL/hr over 90 Minutes Intravenous  Once 08/06/23  1603 08/06/23 1926         Subjective: Patient seen and examined in dialysis unit, she is fully alert and oriented, she has no complaints at all.  Objective: Vitals:   08/07/23 1100 08/07/23 1130 08/07/23 1218 08/07/23 1222  BP: (!) 126/27 (!) 109/37 (!) 110/37 (!) 122/41  Pulse:  (!) 49 (!) 51 (!) 50 (!) 52  Resp: (!) 21 16 15 15   Temp:    97.8 F (36.6 C)  TempSrc:      SpO2: 96% 98% 98% 100%  Weight:      Height:        Intake/Output Summary (Last 24 hours) at 08/07/2023 1303 Last data filed at 08/07/2023 1222 Gross per 24 hour  Intake 336.15 ml  Output 2500 ml  Net -2163.85 ml   Filed Weights   08/05/23 1330 08/07/23 0502  Weight: 54.4 kg 52.1 kg    Examination:  General exam: Appears calm and comfortable  Respiratory system: Clear to auscultation. Respiratory effort normal. Cardiovascular system: S1 & S2 heard, sinus bradycardia. No JVD, murmurs, rubs, gallops or clicks. No pedal edema. Gastrointestinal system: Abdomen is nondistended, soft and nontender. No organomegaly or masses felt. Normal bowel sounds heard. Central nervous system: Alert and oriented. No focal neurological deficits. Extremities: Symmetric 5 x 5 power. Skin: No rashes, lesions or ulcers   Data Reviewed: I have personally reviewed following labs and imaging studies  CBC: Recent Labs  Lab 08/05/23 1441 08/06/23 0213 08/07/23 0512  WBC 4.2 5.9 4.0  HGB 7.5* 6.3* 7.8*  HCT 22.9* 18.7* 23.5*  MCV 115.7* 114.7* 110.3*  PLT 162 183 173   Basic Metabolic Panel: Recent Labs  Lab 08/05/23 1441 08/06/23 0213 08/06/23 1654 08/07/23 0512  NA 135 131*  --  135  K 3.6 3.6  --  3.9  CL 97* 98  --  101  CO2 24 22  --  22  GLUCOSE 104* 113*  --  83  BUN 29* 30*  --  43*  CREATININE 4.33* 4.90*  --  6.53*  CALCIUM 8.5* 8.3*  --  8.4*  MG  --  1.4*  --  1.5*  PHOS  --   --  4.4  --    GFR: Estimated Creatinine Clearance: 4.7 mL/min (A) (by C-G formula based on SCr of 6.53 mg/dL (H)). Liver Function Tests: Recent Labs  Lab 08/06/23 1654  ALBUMIN 2.6*   No results for input(s): "LIPASE", "AMYLASE" in the last 168 hours. No results for input(s): "AMMONIA" in the last 168 hours. Coagulation Profile: No results for input(s): "INR", "PROTIME" in the last 168  hours. Cardiac Enzymes: No results for input(s): "CKTOTAL", "CKMB", "CKMBINDEX", "TROPONINI" in the last 168 hours. BNP (last 3 results) No results for input(s): "PROBNP" in the last 8760 hours. HbA1C: No results for input(s): "HGBA1C" in the last 72 hours. CBG: No results for input(s): "GLUCAP" in the last 168 hours. Lipid Profile: No results for input(s): "CHOL", "HDL", "LDLCALC", "TRIG", "CHOLHDL", "LDLDIRECT" in the last 72 hours. Thyroid Function Tests: Recent Labs    08/05/23 1441  TSH 9.781*  FREET4 1.03   Anemia Panel: No results for input(s): "VITAMINB12", "FOLATE", "FERRITIN", "TIBC", "IRON", "RETICCTPCT" in the last 72 hours. Sepsis Labs: No results for input(s): "PROCALCITON", "LATICACIDVEN" in the last 168 hours.  No results found for this or any previous visit (from the past 240 hours).   Radiology Studies: DG Chest Portable 1 View Result Date: 08/05/2023 CLINICAL DATA:  Fluid overload  EXAM: PORTABLE CHEST 1 VIEW COMPARISON:  Chest radiograph dated 05/31/2023 FINDINGS: Lines/tubes: Left internal jugular venous catheter tip projects over the right atrium. Left axillary stent graft. Lungs: Low lung volumes. Diffuse bilateral interstitial opacities. Bibasilar patchy opacities. Pleura: Small bilateral pleural effusions.  No pneumothorax. Heart/mediastinum: Similar enlarged cardiomediastinal silhouette. Bones: Median sternotomy wires are nondisplaced. Degenerative changes of the right shoulder. IMPRESSION: 1. Cardiomegaly with pulmonary edema and small bilateral pleural effusions. 2. Bibasilar patchy opacities, likely atelectasis. Aspiration or pneumonia can be considered in the appropriate clinical setting. Electronically Signed   By: Agustin Cree M.D.   On: 08/05/2023 14:32    Scheduled Meds:  calcitRIOL  1.75 mcg Oral Q M,W,F-HD   Chlorhexidine Gluconate Cloth  6 each Topical Daily   cinacalcet  30 mg Oral Q M,W,F-HD   darbepoetin (ARANESP) injection - DIALYSIS  100 mcg  Subcutaneous Q Mon-1800   gabapentin  100 mg Oral QPM   heparin  5,000 Units Subcutaneous Q8H   levETIRAcetam  500 mg Oral Daily   levothyroxine  125 mcg Oral Q0600   pantoprazole  40 mg Oral Daily   rosuvastatin  10 mg Oral Daily   Continuous Infusions:  sodium chloride 20 mL/hr at 08/07/23 0346   anticoagulant sodium citrate     magnesium sulfate bolus IVPB       LOS: 1 day   Hughie Closs, MD [facility]  08/07/2023, 1:03 PM   *Please note that this is a verbal dictation therefore any spelling or grammatical errors are due to the "Dragon [employer] One" system interpretation.  Please page via Amion and do not message via secure chat for urgent patient care matters. Secure chat can be used for non urgent patient care matters.  How to contact the [facility] Attending or Consulting provider 7A - 7P or covering provider during after hours 7P -7A, for this patient?  Check the care team in [facility] and look for a) attending/consulting TRH provider listed and b) the [facility] team listed. Page or secure chat 7A-7P. Log into [email or web address] and use [place]'s universal password to access. If you do not have the password, please contact the hospital operator. Locate the [facility] provider you are looking for under [facility] and page to a number that you can be directly reached. If you still have difficulty reaching the provider, please page the [facility] (Director on Call) for the Hospitalists listed on amion for assistance.

## 2023-08-07 NOTE — Progress Notes (Signed)
Noted patient latest potassium is 3.9, 3K dialysate bath is currently unavailable. Dr. Valentino Nose notified and gave order to run patient on 4k dialysate bath today.

## 2023-08-07 NOTE — Evaluation (Signed)
Occupational Therapy Evaluation Patient Details Name: Shawna [name] MRN: [id number] DOB: 09-Jul-1941 Today's Date: 08/07/2023   History of Present Illness Patient is 83 y.o. female presented to ED from HD due to bradycardia with rates in 30's. Hgb noted to be 6.3 and pt s/p 1 unit PRBCs on 08/06/23. Pt with multiple admission related to GIB in last 12 months. PMH ESRD on HD MWF, seizure disorder, afib, HTN, HLD, CAD s/p CABG and PCI, hypothyroidism, , Small bowel AVMS, GIB, Hx of colon CA, TIA, PUD, Gout.   Clinical Impression   PTA, pt lives with spouse, typically ambulatory with cane vs RW and reports Modified Independence with ADLs. Pt presents now with minor deficits in strength, endurance, standing balance and cognition. Pt able to mobilize in room using RW with cues/assist to maneuver this DME. No overt LOB or safety concerns noted; pt requires no more than Min A to manage ADLs at this time. Recommend HHOT at DC and will continue to follow acutely.   HR 53-60bpm       If plan is discharge home, recommend the following: A little help with walking and/or transfers;A little help with bathing/dressing/bathroom    Functional Status Assessment  Patient has had a recent decline in their functional status and demonstrates the ability to make significant improvements in function in a reasonable and predictable amount of time.  Equipment Recommendations  None recommended by OT    Recommendations for Other Services       Precautions / Restrictions Precautions Precautions: Fall;Other (comment) Precaution Comments: watch HR (brady) Restrictions Weight Bearing Restrictions Per Provider Order: No      Mobility Bed Mobility Overal bed mobility: Needs Assistance Bed Mobility: Supine to Sit     Supine to sit: Min assist     General bed mobility comments: able to bring LE to EOB and lift trunk but Min A needed to fully scoot hips to EOB    Transfers Overall transfer level: Needs  assistance Equipment used: Rolling walker (2 wheels) Transfers: Sit to/from Stand Sit to Stand: Contact guard assist                  Balance Overall balance assessment: Needs assistance Sitting-balance support: No upper extremity supported, Feet supported Sitting balance-Leahy Scale: Fair     Standing balance support: No upper extremity supported, Bilateral upper extremity supported, During functional activity Standing balance-Leahy Scale: Poor                             ADL either performed or assessed with clinical judgement   ADL Overall ADL's : Needs assistance/impaired Eating/Feeding: Independent   Grooming: Contact guard assist;Standing   Upper Body Bathing: Set up;Sitting   Lower Body Bathing: Minimal assistance;Sitting/lateral leans;Sit to/from stand   Upper Body Dressing : Set up;Sitting   Lower Body Dressing: Minimal assistance;Sitting/lateral leans;Sit to/from stand   Toilet Transfer: Contact guard assist;Ambulation;Rolling walker (2 wheels)   Toileting- Clothing Manipulation and Hygiene: Contact guard assist;Sitting/lateral lean;Sit to/from stand       Functional mobility during ADLs: Rolling walker (2 wheels);Minimal assistance General ADL Comments: step by step cues and assist to manuever RW in room     Vision Baseline Vision/History: 1 Wears glasses Ability to See in Adequate Light: 0 Adequate Patient Visual Report: No change from baseline Vision Assessment?: No apparent visual deficits     Perception         Praxis  Pertinent Vitals/Pain Pain Assessment Pain Assessment: No/denies pain     Extremity/Trunk Assessment Upper Extremity Assessment Upper Extremity Assessment: Defer to OT evaluation   Lower Extremity Assessment Lower Extremity Assessment: Generalized weakness   Cervical / Trunk Assessment Cervical / Trunk Assessment: Kyphotic   Communication Communication Communication: No apparent  difficulties Cueing Techniques: Verbal cues;Gestural cues   Cognition Arousal: Alert Behavior During Therapy: WFL for tasks assessed/performed Overall Cognitive Status: Impaired/Different from baseline Area of Impairment: Attention, Memory, Following commands, Awareness, Problem solving                   Current Attention Level: Sustained Memory: Decreased short-term memory Following Commands: Follows one step commands consistently, Follows one step commands with increased time   Awareness: Emergent Problem Solving: Slow processing, Decreased initiation, Difficulty sequencing, Requires verbal cues, Requires tactile cues General Comments: pleasant, memory deficits as pt initially reported reason for hospitalization then later couldnt recall, also could not recall what she requested to order for lunch. follows directions but requires consistent cues for safe DME use     General Comments  Assisted pt to order lunch and call husband during session    Exercises     Shoulder Instructions      Home Living Family/patient expects to be discharged to:: Private residence Living Arrangements: Spouse/significant other Available Help at Discharge: Family;Available 24 hours/day Type of Home: House Home Access: Stairs to enter [employer] of Steps: 2 Entrance Stairs-Rails: Right Home Layout: One level     Bathroom Shower/Tub: Chief Strategy Officer: Standard Bathroom Accessibility: Yes   Home Equipment: Rollator (4 wheels);Cane - single point;Shower seat;BSC/3in1;Rolling Environmental consultant (2 wheels);Cane - quad          Prior Functioning/Environment Prior Level of Function : Needs assist             Mobility Comments: use of cane vs walker for mobility depending on strength on a given day ADLs Comments: Modified Independent with ADLs. Reports husband manages cooking, cleaning, transporation needs        OT Problem List: Decreased strength;Decreased  activity tolerance;Impaired balance (sitting and/or standing);Decreased cognition;Decreased knowledge of use of DME or AE      OT Treatment/Interventions: Self-care/ADL training;Therapeutic exercise;Energy conservation;DME and/or AE instruction;Therapeutic activities;Patient/family education    OT Goals(Current goals can be found in the care plan section) Acute Rehab OT Goals Patient Stated Goal: home today OT Goal Formulation: With patient Time For Goal Achievement: 08/21/23 Potential to Achieve Goals: Good ADL Goals Pt Will Perform Lower Body Dressing: with modified independence;sit to/from stand Pt Will Transfer to Toilet: with modified independence;ambulating  OT Frequency: Min 1X/week    Co-evaluation PT/OT/SLP Co-Evaluation/Treatment: Yes Reason for Co-Treatment: Other (comment) (imminent DC needs)   OT goals addressed during session: ADL's and self-care;Proper use of Adaptive equipment and DME      AM-PAC OT "6 Clicks" Daily Activity     Outcome Measure Help from another person eating meals?: None Help from another person taking care of personal grooming?: A Little Help from another person toileting, which includes using toliet, bedpan, or urinal?: A Little Help from another person bathing (including washing, rinsing, drying)?: A Little Help from another person to put on and taking off regular upper body clothing?: A Little Help from another person to put on and taking off regular lower body clothing?: A Little 6 Click Score: 19   End of Session Equipment Utilized During Treatment: Gait belt;Rolling walker (2 wheels) Nurse Communication: Mobility status (HR)  Activity Tolerance: Patient tolerated treatment well Patient left: in chair;with call bell/phone within reach;with chair alarm set;Other (comment) (no nurse call cord to connect to chair alarm but will alarm in room if pt stands from chair - RN notified)  OT Visit Diagnosis: Other abnormalities of gait and mobility  (R26.89);Muscle weakness (generalized) (M62.81)                Time: 0981-1914 OT Time Calculation (min): 28 min Charges:  OT General Charges $OT Visit: 1 Visit OT Evaluation $OT Eval Low Complexity: 1 Low  Bradd Canary, OTR/L Acute Rehab Services Office: [phone]   Lorre Munroe 08/07/2023, 2:17 PM

## 2023-08-07 NOTE — Progress Notes (Signed)
   08/07/23 1222  Vitals  Temp 97.8 F (36.6 C)  Pulse Rate (!) 52  Resp 15  BP (!) 122/41  SpO2 100 %  O2 Device Room Air  Post Treatment  Dialyzer Clearance Lightly streaked  Liters Processed 58.5  Fluid Removed (mL) 2500 mL   Received patient in bed to unit.  Alert and oriented.  Informed consent signed and in chart.   TX duration:3.25  Patient tolerated well.  Transported back to the room  Alert, without acute distress.  Hand-off given to patient's nurse.   Access used: R CVC Access issues: None  Total UF removed: Medication(s) given: See MAR    Laqueta Due, RN [facility] Unit

## 2023-08-07 NOTE — Evaluation (Signed)
Physical Therapy Evaluation Patient Details Name: Shawna [name] MRN: [id number] DOB: Aug 30, 1940 Today's Date: 08/07/2023  History of Present Illness  Patient is 83 y.o. female presented to ED from HD due to bradycardia with rates in 30's. Hgb noted to be 6.3 and pt s/p 1 unit PRBCs on 08/06/23. Pt with multiple admission related to GIB in last 12 months. PMH ESRD on HD MWF, seizure disorder, afib, HTN, HLD, CAD s/p CABG and PCI, hypothyroidism, , Small bowel AVMS, GIB, Hx of colon CA, TIA, PUD, Gout.   Clinical Impression  Shawna [name] is 83 y.o. female admitted with above HPI and diagnosis. Patient is currently limited by functional impairments below (see PT problem list). Patient lives with spouse and is mod ind with SPC/RW at baseline. Per pt and spouse report she has been getting weaker recently. Currently pt requires supervision foe bed mob and CGA/min assist for transfers and gait with RW. Pt amb short bout in room, HR stable in 50-60's during mobility. Patient will benefit from continued skilled PT interventions to address impairments and progress independence with mobility. Acute PT will follow and progress as able.         If plan is discharge home, recommend the following: A little help with walking and/or transfers;A little help with bathing/dressing/bathroom   Can travel by private vehicle        Equipment Recommendations None recommended by PT  Recommendations for Other Services       Functional Status Assessment Patient has had a recent decline in their functional status and demonstrates the ability to make significant improvements in function in a reasonable and predictable amount of time.     Precautions / Restrictions Precautions Precautions: Fall;Other (comment) Precaution Comments: watch HR (brady) Restrictions Weight Bearing Restrictions Per Provider Order: No      Mobility  Bed Mobility Overal bed mobility: Needs Assistance Bed Mobility: Supine to Sit      Supine to sit: Supervision     General bed mobility comments: sup with extra time and use of bed features. assist for line management.    Transfers Overall transfer level: Needs assistance Equipment used: Rolling walker (2 wheels) Transfers: Sit to/from Stand Sit to Stand: Contact guard assist           General transfer comment: pt reliant on bil UE to power up, CGA for safety. pt with uncoltrolled lower to recliner on first sit. performed sit<>stand from reclier with cues for reach back and was able to control slow lower.    Ambulation/Gait Ambulation/Gait assistance: Min assist, Contact guard assist Gait Distance (Feet): 10 Feet Assistive device: Rolling walker (2 wheels) Gait Pattern/deviations: Step-through pattern, Decreased step length - right, Decreased step length - left, Decreased stride length, Shuffle, Trunk flexed Gait velocity: decr     General Gait Details: short bout of gait in room with RW. HR in 50-60's throughout. pt overall steady but required min assist and cues to maintain safe proximity to RW.  Stairs            Wheelchair Mobility     Tilt Bed    Modified Rankin (Stroke Patients Only)       Balance                                             Pertinent Vitals/Pain Pain Assessment Pain Assessment: No/denies pain  Home Living Family/patient expects to be discharged to:: Private residence Living Arrangements: Spouse/significant other Available Help at Discharge: Family;Available 24 hours/day Type of Home: House Home Access: Stairs to enter Entrance Stairs-Rails: Right Entrance Stairs-Number of Steps: 2   Home Layout: One level Home Equipment: Rollator (4 wheels);Cane - single point;Shower seat;BSC/3in1;Rolling Environmental consultant (2 wheels);Cane - quad      Prior Function Prior Level of Function : Needs assist             Mobility Comments: use of cane vs walker for mobility depending on strength on a given  day ADLs Comments: Modified Independent with ADLs. Reports husband manages cooking, cleaning, transporation needs     Extremity/Trunk Assessment   Upper Extremity Assessment Upper Extremity Assessment: Defer to OT evaluation    Lower Extremity Assessment Lower Extremity Assessment: Generalized weakness    Cervical / Trunk Assessment Cervical / Trunk Assessment: Kyphotic  Communication   Communication Communication: No apparent difficulties Cueing Techniques: Verbal cues;Gestural cues  Cognition Arousal: Alert Behavior During Therapy: WFL for tasks assessed/performed Overall Cognitive Status: Impaired/Different from baseline Area of Impairment: Orientation                 Orientation Level: Situation             General Comments: pleasant. slightly confused about situation/being in hospital. Pt thinking she was just at HD and repeatedly asking "where is my husband" as he picks her up from dialysis. pt with pleasant reaction when re-oriented to hospital admisison.        General Comments General comments (skin integrity, edema, etc.): Assisted pt to order lunch and call husband during session    Exercises     Assessment/Plan    PT Assessment Patient needs continued PT services  PT Problem List Decreased strength;Decreased range of motion;Decreased activity tolerance;Decreased balance;Decreased mobility;Decreased cognition;Decreased knowledge of use of DME;Decreased safety awareness;Decreased knowledge of precautions;Obesity;Cardiopulmonary status limiting activity       PT Treatment Interventions DME instruction;Gait training;Stair training;Functional mobility training;Therapeutic activities;Therapeutic exercise;Balance training;Neuromuscular re-education;Cognitive remediation;Patient/family education    PT Goals (Current goals can be found in the Care Plan section)  Acute Rehab PT Goals PT Goal Formulation: With patient Potential to Achieve Goals: Good     Frequency Min 1X/week     Co-evaluation   Reason for Co-Treatment: Other (comment) (imminent DC needs)   OT goals addressed during session: ADL's and self-care;Proper use of Adaptive equipment and DME       AM-PAC PT "6 Clicks" Mobility  Outcome Measure Help needed turning from your back to your side while in a flat bed without using bedrails?: A Little Help needed moving from lying on your back to sitting on the side of a flat bed without using bedrails?: A Little Help needed moving to and from a bed to a chair (including a wheelchair)?: A Little Help needed standing up from a chair using your arms (e.g., wheelchair or bedside chair)?: A Little Help needed to walk in hospital room?: A Little Help needed climbing 3-5 steps with a railing? : A Lot 6 Click Score: 17    End of Session Equipment Utilized During Treatment: Gait belt Activity Tolerance: Patient tolerated treatment well Patient left: with call bell/phone within reach;in chair;with chair alarm set Nurse Communication: Mobility status PT Visit Diagnosis: Unsteadiness on feet (R26.81);Muscle weakness (generalized) (M62.81);Difficulty in walking, not elsewhere classified (R26.2);Other symptoms and signs involving the nervous system (R29.898)    Time: 5409-8119 PT Time Calculation (min) (ACUTE ONLY):  19 min   Charges:   PT Evaluation $PT Eval Low Complexity: 1 Low   PT General Charges $$ ACUTE PT VISIT: 1 Visit         Wynn Maudlin, DPT Acute Rehabilitation Services Office [phone]  08/07/23 2:19 PM

## 2023-08-07 NOTE — Procedures (Signed)
I was present at this dialysis session. I have reviewed the session itself and made appropriate changes.   Filed Weights   08/05/23 1330 08/07/23 0502  Weight: 54.4 kg 52.1 kg    Recent Labs  Lab 08/06/23 1654 08/07/23 0512  NA  --  135  K  --  3.9  CL  --  101  CO2  --  22  GLUCOSE  --  83  BUN  --  43*  CREATININE  --  6.53*  CALCIUM  --  8.4*  PHOS 4.4  --     Recent Labs  Lab 08/05/23 1441 08/06/23 0213 08/07/23 0512  WBC 4.2 5.9 4.0  HGB 7.5* 6.3* 7.8*  HCT 22.9* 18.7* 23.5*  MCV 115.7* 114.7* 110.3*  PLT 162 183 173    Scheduled Meds:  calcitRIOL  1.75 mcg Oral Q M,W,F-HD   Chlorhexidine Gluconate Cloth  6 each Topical Daily   cinacalcet  30 mg Oral Q M,W,F-HD   darbepoetin (ARANESP) injection - DIALYSIS  100 mcg Subcutaneous Q Mon-1800   gabapentin  100 mg Oral QPM   heparin  5,000 Units Subcutaneous Q8H   levETIRAcetam  500 mg Oral Daily   levothyroxine  125 mcg Oral Q0600   pantoprazole  40 mg Oral Daily   rosuvastatin  10 mg Oral Daily   Continuous Infusions:  sodium chloride 20 mL/hr at 08/07/23 0346   anticoagulant sodium citrate     PRN Meds:.alteplase, anticoagulant sodium citrate, atropine, cyclobenzaprine, heparin, lidocaine (PF), lidocaine-prilocaine, ondansetron **OR** ondansetron (ZOFRAN) IV, pentafluoroprop-tetrafluoroeth, sodium chloride flush   Louie Bun,  MD 08/07/2023, 9:40 AM

## 2023-08-07 NOTE — Progress Notes (Signed)
Nephrology Follow-Up Consult note   Assessment/Recommendations: Shawna [name] is a/an 83 y.o. female with a past medical history significant for ESRD, admitted for bradycardia.        OP HD: MWF [facility] From nov 2024 --> 3.5h 55kg  2/2.5 bath  300/500   [facility] TDC  Heparin none  - needs updating       Assessment/ Plan: Sinus bradycardia - per pmd / cardiology, amio and metoprolol are on hold. Her b/l heart rate is around 55- 65 bpm from prior ekgs.  No plans for pacemaker at this time ESRD - on HD MWF.  Maintain current schedule HTN -blood pressure somewhat elevated.  Was on hydralazine/ metoprolol at home. Holding BB and amiodarone now due to low HR. continue to monitor blood pressure with dialysis Poor IV access: needed to use the [facility] last night for prbc's. Get labs w/ HD in am. Will give IV vanc 1.25 gm x 1 and will ask IV team to good ahead an re-access the line now because it is not safe to not have IV access w/ her bradycardia situation.  Hopefully can obtain central access if she is going to require ongoing hospitalization.  Primary team will contact IR if needed Volume - mild vol excess on exam.  Ultrafiltration as tolerated Anemia of esrd - Hb 7.- 8 range.  Has received 1 unit of PRBCs.  Iron labs likely not helpful today.  Will provide ESA Secondary hyperparathyroidism - CCa in range, phos at goal Chronic resp failure - on home O2 Atrial fib - per pmd     Recommendations conveyed to primary service.    Darnell Level  [facility] 08/07/2023 9:36 AM  ___________________________________________________________  CC: Bradycardia  Interval History/Subjective: Patient feels well today with no complaints.  Undergoing dialysis with no issues   Medications:  Current Facility-Administered Medications  Medication Dose Route Frequency Provider Last Rate Last Admin   0.9 %  sodium chloride infusion   Intravenous Continuous Delano Metz, MD 20 mL/hr at  08/07/23 0346 Infusion Verify at 08/07/23 0346   alteplase (CATHFLO ACTIVASE) injection 2 mg  2 mg Intracatheter Once PRN Delano Metz, MD       anticoagulant sodium citrate solution 5 mL  5 mL Intracatheter PRN Delano Metz, MD       atropine 1 MG/10ML injection 0.5 mg  0.5 mg Intravenous Q1H PRN Howerter, Justin B, DO       calcitRIOL (ROCALTROL) capsule 1.75 mcg  1.75 mcg Oral Q M,W,F-HD Stinson, Jacob J, DO       Chlorhexidine Gluconate Cloth 2 % PADS 6 each  6 each Topical Daily Levie Heritage, DO   6 each at 08/07/23 0800   cinacalcet (SENSIPAR) tablet 30 mg  30 mg Oral Q M,W,F-HD Levie Heritage, DO       cyclobenzaprine (FLEXERIL) tablet 5 mg  5 mg Oral TID PRN Levie Heritage, DO       gabapentin (NEURONTIN) capsule 100 mg  100 mg Oral QPM Levie Heritage, DO   100 mg at 08/06/23 1756   heparin injection 1,000 Units  1,000 Units Intracatheter PRN Delano Metz, MD       heparin injection 5,000 Units  5,000 Units Subcutaneous Q8H Levie Heritage, DO   5,000 Units at 08/06/23 2231   levETIRAcetam (KEPPRA) tablet 500 mg  500 mg Oral Daily Levie Heritage, DO   500 mg at 08/07/23 0758   levothyroxine (SYNTHROID) tablet 125  mcg  125 mcg Oral Q0600 Levie Heritage, DO   125 mcg at 08/07/23 0545   lidocaine (PF) (XYLOCAINE) 1 % injection 5 mL  5 mL Intradermal PRN Delano Metz, MD       lidocaine-prilocaine (EMLA) cream 1 Application  1 Application Topical PRN Delano Metz, MD       ondansetron [facility]) tablet 4 mg  4 mg Oral Q6H PRN Levie Heritage, DO       Or   ondansetron [facility]) injection 4 mg  4 mg Intravenous Q6H PRN Levie Heritage, DO       pantoprazole (PROTONIX) EC tablet 40 mg  40 mg Oral Daily Levie Heritage, DO   40 mg at 08/07/23 1610   pentafluoroprop-tetrafluoroeth (GEBAUERS) aerosol 1 Application  1 Application Topical PRN Delano Metz, MD       rosuvastatin (CRESTOR) tablet 10 mg  10 mg Oral Daily Levie Heritage, DO   10 mg at 08/07/23 0758    sodium chloride flush (NS) 0.9 % injection 10-40 mL  10-40 mL Intracatheter PRN Lurene Shadow, MD          Review of Systems: 10 systems reviewed and negative except per interval history/subjective  Physical Exam: Vitals:   08/07/23 0846 08/07/23 0900  BP: (!) 131/35 (!) 123/40  Pulse: (!) 45 (!) 42  Resp: (!) 21 14  Temp: 98.7 F (37.1 C)   SpO2: 96% 95%   No intake/output data recorded.  Intake/Output Summary (Last 24 hours) at 08/07/2023 9604 Last data filed at 08/07/2023 0346 Gross per 24 hour  Intake 246.15 ml  Output --  Net 246.15 ml   Constitutional: well-appearing, no acute distress ENMT: ears and nose without scars or lesions, MMM CV: normal rate, no edema Respiratory: Bilateral chest rise, normal work of breathing Gastrointestinal: soft, non-tender, no palpable masses or hernias Skin: no visible lesions or rashes Psych: alert, judgement/insight appropriate, appropriate mood and affect   Test Results I personally reviewed new and old clinical labs and radiology tests Lab Results  Component Value Date   NA 135 08/07/2023   K 3.9 08/07/2023   CL 101 08/07/2023   CO2 22 08/07/2023   BUN 43 (H) 08/07/2023   CREATININE 6.53 (H) 08/07/2023   CALCIUM 8.4 (L) 08/07/2023   ALBUMIN 2.6 (L) 08/06/2023   PHOS 4.4 08/06/2023    CBC Recent Labs  Lab 08/05/23 1441 08/06/23 0213 08/07/23 0512  WBC 4.2 5.9 4.0  HGB 7.5* 6.3* 7.8*  HCT 22.9* 18.7* 23.5*  MCV 115.7* 114.7* 110.3*  PLT 162 183 173

## 2023-08-07 NOTE — Progress Notes (Signed)
Patient Name: Shawna [name] Date of Encounter: 08/07/2023 [facility] Cardiologist: Dina Rich, MD   Interval Summary  .    Currently undergoing HD.  Seems lethargic but she reports no complaints such as lightheadedness, dizziness, chest pain, shortness of breath.  Vital Signs .    Vitals:   08/07/23 0502 08/07/23 0725 08/07/23 0846 08/07/23 0900  BP: (!) 179/51 (!) 142/49 (!) 131/35 (!) 123/40  Pulse: (!) 43 90 (!) 45 (!) 42  Resp: 17 18 (!) 21 14  Temp: (!) 97.2 F (36.2 C) 98 F (36.7 C) 98.7 F (37.1 C)   TempSrc: Oral Oral    SpO2: 96% 98% 96% 95%  Weight: 52.1 kg     Height:        Intake/Output Summary (Last 24 hours) at 08/07/2023 0935 Last data filed at 08/07/2023 0346 Gross per 24 hour  Intake 246.15 ml  Output --  Net 246.15 ml      08/07/2023    5:02 AM 08/05/2023    1:30 PM 07/11/2023    8:05 PM  Last 3 Weights  Weight (lbs) 114 lb 14.4 oz 120 lb 121 lb 11.1 oz  Weight (kg) 52.118 kg 54.432 kg 55.2 kg      Telemetry/ECG    Sinus bradycardia with heart rates 45.- Personally Reviewed  CV Studies    Left heart catheterization 07/22/2021    Mid LM to Dist LM lesion is 60% stenosed.   Prox LAD lesion is 90% stenosed.   Ost Cx to Mid Cx lesion is 99% stenosed.   Ost RCA to Prox RCA lesion is 100% stenosed.   Ost Ramus to Ramus lesion is 80% stenosed.   Ost 1st Diag to 1st Diag lesion is 100% stenosed.   Ost RPDA to RPDA lesion is 75% stenosed.   2nd Mrg lesion is 60% stenosed.   Dist LAD lesion is 70% stenosed.   LIMA and is large.   SVG and is large.   SVG and is normal in caliber.   SVG and is large.   The graft exhibits no disease.   The graft exhibits no disease.   The graft exhibits no disease.   LV end diastolic pressure is mildly elevated.   The left ventricular ejection fraction is 35-45% by visual estimate.  Echocardiogram 03/15/2021 1. Left ventricular ejection fraction, by estimation, is 50%. The left  ventricle has  low normal function. The left ventricle has no regional wall  motion abnormalities. There is mild left ventricular hypertrophy. Left  ventricular diastolic parameters are   consistent with Grade II diastolic dysfunction (pseudonormalization).  Elevated left atrial pressure.   2. Right ventricular systolic function is normal. The right ventricular  size is normal. There is moderately elevated pulmonary artery systolic  pressure.   3. Left atrial size was severely dilated.   4. Right atrial size was mildly dilated.   5. The mitral valve is abnormal. Moderate mitral valve regurgitation.  Mild mitral stenosis. Moderate mitral annular calcification.   6. The tricuspid valve is abnormal. Tricuspid valve regurgitation is  moderate.   7. The aortic valve is tricuspid. There is moderate calcification of the  aortic valve. There is moderate thickening of the aortic valve. Aortic  valve regurgitation is not visualized. No aortic stenosis is present.   8. The inferior vena cava is normal in size with greater than 50%  respiratory variability, suggesting right atrial pressure of 3 mmHg.   Physical Exam .  GEN: No acute distress.   Neck: No JVD Cardiac: RRR, no murmurs, rubs, or gallops.  Respiratory: Clear to auscultation bilaterally. GI: Soft, nontender, non-distended  MS: No edema  Patient Profile    Shawna [name] is a 83 y.o. female has hx of  ESRD on hemodialysis, CAD s/p CABG 2013, DES to D1 in April 2023, HFrEF LBBB diabetes, hypertension, PAF not on anticoagulation due to history of recurrent GI bleed, GERD, history of TIA , colon CA, esophageal stricture.  Cardiology asked to see due to bradycardia noted during dialysis session.  Assessment & Plan .     Sinus bradycardia Paroxysmal atrial fibrillation Per chart review this is not a new finding, historically has rates in the 40s to low 50s.  Here she is around 45.  Given that she is asymptomatic with no reported falls, syncope,  dizziness.  No evidence of high-grade block, do not think she needs a PPM Lopressor and amiodarone being held. Not on anticoagulation given previous history of recurrent GI bleeds requiring blood transfusions.  Chronic HFrEF 30-35% Last echo was in January 2023.  At that time had grade 2 diastolic dysfunction with moderately reduced RV function, RWMA.   Volume managed by HD.  Appears to be euvolemic. GDMT limited by renal disease.  PTA was on hydralazine 25 mg twice daily, resume.  Valvular disease Moderate MR, mild to moderate MS. previously not felt to be a candidate for intervention due to ESRD and recurrent bleeding issues so routine echocardiograms have been deferred. Will defer to MD if to repeat echo here  CAD status post CABG 2013 Last cardiac catheterization in January 2023 showed patent grafts.  No anginal complaints here.  Medical therapy recommended. Continue rosuvastatin 10 mg.  Well-controlled, LDL 34 7 months ago.  ESRD on HD  Hypothyroidism Abnormal TSH 9.  T4 normal.  On Synthroid per primary team, not sure how much this could be contributing to her bradycardia or if T3 is warranted.  Defer to primary team.  Anemia Status post 1 unit of PRBCs 1/26.  No signs of bleeding.  Further management per primary team.  For questions or updates, please contact [facility] Please consult [email or web address] for contact info under        Signed, Abagail Kitchens, PA-C

## 2023-08-08 ENCOUNTER — Other Ambulatory Visit (HOSPITAL_COMMUNITY): Payer: Self-pay

## 2023-08-08 DIAGNOSIS — R001 Bradycardia, unspecified: Secondary | ICD-10-CM | POA: Diagnosis not present

## 2023-08-08 LAB — BASIC METABOLIC PANEL
Anion gap: 12 (ref 5–15)
BUN: 22 mg/dL (ref 8–23)
CO2: 24 mmol/L (ref 22–32)
Calcium: 8.5 mg/dL — ABNORMAL LOW (ref 8.9–10.3)
Chloride: 98 mmol/L (ref 98–111)
Creatinine, Ser: 4.52 mg/dL — ABNORMAL HIGH (ref 0.44–1.00)
GFR, Estimated: 9 mL/min — ABNORMAL LOW (ref >60)
Glucose, Bld: 75 mg/dL (ref 70–99)
Potassium: 3.7 mmol/L (ref 3.5–5.1)
Sodium: 134 mmol/L — ABNORMAL LOW (ref 135–145)

## 2023-08-08 LAB — TYPE AND SCREEN
ABO/RH(D): O POS
Antibody Screen: NEGATIVE
Donor AG Type: NEGATIVE
Donor AG Type: NEGATIVE
Unit division: 0
Unit division: 0

## 2023-08-08 LAB — BPAM RBC
Blood Product Expiration Date: 202502222359
Blood Product Expiration Date: 202503032359
ISSUE DATE / TIME: 202501260631
Unit Type and Rh: 5100
Unit Type and Rh: 5100

## 2023-08-08 LAB — HEPATITIS B SURFACE ANTIBODY, QUANTITATIVE: Hep B S AB Quant (Post): 119 m[IU]/mL

## 2023-08-08 LAB — CBC WITH DIFFERENTIAL/PLATELET
Abs Immature Granulocytes: 0 10*3/uL (ref 0.00–0.07)
Basophils Absolute: 0.1 10*3/uL (ref 0.0–0.1)
Basophils Relative: 3 %
Eosinophils Absolute: 0 10*3/uL (ref 0.0–0.5)
Eosinophils Relative: 1 %
HCT: 25.5 % — ABNORMAL LOW (ref 36.0–46.0)
Hemoglobin: 8.5 g/dL — ABNORMAL LOW (ref 12.0–15.0)
Lymphocytes Relative: 8 %
Lymphs Abs: 0.4 10*3/uL — ABNORMAL LOW (ref 0.7–4.0)
MCH: 36.2 pg — ABNORMAL HIGH (ref 26.0–34.0)
MCHC: 33.3 g/dL (ref 30.0–36.0)
MCV: 108.5 fL — ABNORMAL HIGH (ref 80.0–100.0)
Monocytes Absolute: 0.2 10*3/uL (ref 0.1–1.0)
Monocytes Relative: 4 %
Neutro Abs: 4 10*3/uL (ref 1.7–7.7)
Neutrophils Relative %: 84 %
Platelets: 190 10*3/uL (ref 150–400)
RBC: 2.35 MIL/uL — ABNORMAL LOW (ref 3.87–5.11)
RDW: 23.5 % — ABNORMAL HIGH (ref 11.5–15.5)
WBC: 4.8 10*3/uL (ref 4.0–10.5)
nRBC: 0 % (ref 0.0–0.2)
nRBC: 0 /100{WBCs}

## 2023-08-08 LAB — MAGNESIUM: Magnesium: 2.8 mg/dL — ABNORMAL HIGH (ref 1.7–2.4)

## 2023-08-08 MED ORDER — LEVOTHYROXINE SODIUM 125 MCG PO TABS
125.0000 ug | ORAL_TABLET | Freq: Every day | ORAL | 0 refills | Status: DC
  Filled 2023-08-08: qty 30, 30d supply, fill #0

## 2023-08-08 NOTE — Plan of Care (Signed)
  Problem: Education: Goal: Knowledge of General Education information will improve Description: Including pain rating scale, medication(s)/side effects and non-pharmacologic comfort measures Outcome: Adequate for Discharge   Problem: Health Behavior/Discharge Planning: Goal: Ability to manage health-related needs will improve Outcome: Adequate for Discharge   Problem: Clinical Measurements: Goal: Ability to maintain clinical measurements within normal limits will improve Outcome: Adequate for Discharge Goal: Will remain free from infection Outcome: Adequate for Discharge Goal: Diagnostic test results will improve Outcome: Adequate for Discharge Goal: Respiratory complications will improve Outcome: Adequate for Discharge Goal: Cardiovascular complication will be avoided Outcome: Adequate for Discharge   Problem: Activity: Goal: Risk for activity intolerance will decrease Outcome: Adequate for Discharge   Problem: Nutrition: Goal: Adequate nutrition will be maintained Outcome: Adequate for Discharge   Problem: Coping: Goal: Level of anxiety will decrease Outcome: Adequate for Discharge   Problem: Elimination: Goal: Will not experience complications related to bowel motility Outcome: Adequate for Discharge Goal: Will not experience complications related to urinary retention Outcome: Adequate for Discharge   Problem: Pain Managment: Goal: General experience of comfort will improve and/or be controlled Outcome: Adequate for Discharge   Problem: Safety: Goal: Ability to remain free from injury will improve Outcome: Adequate for Discharge   Problem: Skin Integrity: Goal: Risk for impaired skin integrity will decrease Outcome: Adequate for Discharge   Problem: Acute Rehab OT Goals (only OT should resolve) Goal: Pt. Will Perform Lower Body Dressing Outcome: Adequate for Discharge Goal: Pt. Will Transfer To Toilet Outcome: Adequate for Discharge   Problem: Acute Rehab  PT Goals(only PT should resolve) Goal: Pt Will Go Supine/Side To Sit Outcome: Adequate for Discharge Goal: Patient Will Transfer Sit To/From Stand Outcome: Adequate for Discharge Goal: Pt Will Ambulate Outcome: Adequate for Discharge

## 2023-08-08 NOTE — Progress Notes (Signed)
Nephrology Follow-Up Consult note   Assessment/Recommendations: Shawna [name] is a/an 83 y.o. female with a past medical history significant for ESRD, admitted for bradycardia.        OP HD: MWF [facility] From nov 2024 --> 3.5h 55kg  2/2.5 bath  300/500   [facility] TDC  Heparin none  - needs updating       Assessment/ Plan: Sinus bradycardia - per pmd / cardiology, amio and metoprolol are on hold. Her b/l heart rate is around 55- 65 bpm from prior ekgs.  No plans for pacemaker at this time ESRD - on HD MWF.  Maintain current schedule HTN -Was on hydralazine/ metoprolol at home. Holding BB and amiodarone now due to low HR. continue to monitor blood pressure with dialysis.  Seems okay today Poor IV access: needed to use the [facility] last night for prbc's. Get labs w/ HD in am. Will give IV vanc 1.25 gm x 1 and will ask IV team to good ahead an re-access the line now because it is not safe to not have IV access w/ her bradycardia situation.  Hopefully can obtain central access if she is going to require ongoing hospitalization.  Primary team will contact IR if needed Volume - mild vol excess on exam.  Ultrafiltration as tolerated Anemia of esrd - Hb 7.- 8 range.  Has received 1 unit of PRBCs.  Iron labs likely not helpful today.  ESA provided on 1/27 Secondary hyperparathyroidism - CCa in range, phos at goal Chronic resp failure - on home O2 Atrial fib - per pmd     Recommendations conveyed to primary service.    Darnell Level [facility] 08/08/2023 9:50 AM  ___________________________________________________________  CC: Bradycardia  Interval History/Subjective: Patient states her feet have been hurting but otherwise denies any complaints.  Tolerated dialysis with no issues   Medications:  Current Facility-Administered Medications  Medication Dose Route Frequency Provider Last Rate Last Admin   0.9 %  sodium chloride infusion   Intravenous Continuous Pahwani, Ravi, MD  20 mL/hr at 08/08/23 0530 Infusion Verify at 08/08/23 0530   atropine 1 MG/10ML injection 0.5 mg  0.5 mg Intravenous Q1H PRN Howerter, Justin B, DO       calcitRIOL (ROCALTROL) capsule 1.75 mcg  1.75 mcg Oral Q M,W,F-HD Levie Heritage, DO   1.75 mcg at 08/07/23 1217   Chlorhexidine Gluconate Cloth 2 % PADS 6 each  6 each Topical Daily Levie Heritage, DO   6 each at 08/08/23 8469   cinacalcet (SENSIPAR) tablet 30 mg  30 mg Oral Q M,W,F-HD Levie Heritage, DO   30 mg at 08/07/23 1416   cyclobenzaprine (FLEXERIL) tablet 5 mg  5 mg Oral TID PRN Levie Heritage, DO       Darbepoetin Alfa (ARANESP) injection 100 mcg  100 mcg Subcutaneous Q Mon-1800 Darnell Level, MD   100 mcg at 08/07/23 1755   gabapentin (NEURONTIN) capsule 100 mg  100 mg Oral QPM Levie Heritage, DO   100 mg at 08/07/23 1709   heparin injection 5,000 Units  5,000 Units Subcutaneous Q8H Levie Heritage, DO   5,000 Units at 08/08/23 0606   levETIRAcetam (KEPPRA) tablet 500 mg  500 mg Oral Daily Levie Heritage, DO   500 mg at 08/08/23 6295   levothyroxine (SYNTHROID) tablet 125 mcg  125 mcg Oral Q0600 Levie Heritage, DO   125 mcg at 08/08/23 0607   ondansetron [facility]) tablet 4  mg  4 mg Oral Q6H PRN Levie Heritage, DO       Or   ondansetron [facility]) injection 4 mg  4 mg Intravenous Q6H PRN Levie Heritage, DO   4 mg at 08/07/23 1544   pantoprazole (PROTONIX) EC tablet 40 mg  40 mg Oral Daily Levie Heritage, DO   40 mg at 08/08/23 1610   rosuvastatin (CRESTOR) tablet 10 mg  10 mg Oral Daily Levie Heritage, DO   10 mg at 08/08/23 9604   sodium chloride flush (NS) 0.9 % injection 10-40 mL  10-40 mL Intracatheter PRN Lurene Shadow, MD          Review of Systems: 10 systems reviewed and negative except per interval history/subjective  Physical Exam: Vitals:   08/08/23 0504 08/08/23 0715  BP: (!) 131/45 (!) 155/48  Pulse: (!) 52 (!) 49  Resp: 18 17  Temp: 97.9 F (36.6 C) 98 F (36.7 C)  SpO2: 90% 93%    No intake/output data recorded.  Intake/Output Summary (Last 24 hours) at 08/08/2023 0950 Last data filed at 08/08/2023 0700 Gross per 24 hour  Intake 506.1 ml  Output 2500 ml  Net -1993.9 ml   Constitutional: well-appearing, no acute distress ENMT: ears and nose without scars or lesions, MMM CV: Bradycardia, no edema Respiratory: Bilateral chest rise, normal work of breathing Gastrointestinal: soft, non-tender, no palpable masses or hernias Skin: no visible lesions or rashes Psych: alert, judgement/insight appropriate, appropriate mood and affect   Test Results I personally reviewed new and old clinical labs and radiology tests Lab Results  Component Value Date   NA 135 08/07/2023   K 3.9 08/07/2023   CL 101 08/07/2023   CO2 22 08/07/2023   BUN 43 (H) 08/07/2023   CREATININE 6.53 (H) 08/07/2023   CALCIUM 8.4 (L) 08/07/2023   ALBUMIN 2.6 (L) 08/06/2023   PHOS 4.4 08/06/2023    CBC Recent Labs  Lab 08/05/23 1441 08/06/23 0213 08/07/23 0512  WBC 4.2 5.9 4.0  HGB 7.5* 6.3* 7.8*  HCT 22.9* 18.7* 23.5*  MCV 115.7* 114.7* 110.3*  PLT 162 183 173

## 2023-08-08 NOTE — Progress Notes (Signed)
D/C order noted. Contacted [facility] to be advised of pt's d/c today and that pt should resume care tomorrow. D/C summary and today's renal note faxed to clinic for continuation of care.   Olivia Canter Renal Navigator [phone]

## 2023-08-08 NOTE — TOC Transition Note (Signed)
Transition of Care [facility]) - Discharge Note   Patient Details  Name: Shawna [name] MRN: [id number] Date of Birth: [birth year]  Transition of Care [facility]) CM/SW Contact:  Leone Haven, RN Phone Number: 08/08/2023, 10:43 AM   Clinical Narrative:    NCM contacted Elnita Maxwell with [facility] , she states patient will be OON with them.  NCM made referral to [facility] spoke with Charlenia, faxed information to her at [phone].   Phone is [phone].  Patient is for dc today, she has transportation.   Final next level of care: Home w Home Health Services Barriers to Discharge: Continued Medical Work up   Patient Goals and CMS Choice Patient states their goals for this hospitalization and ongoing recovery are:: return home CMS Medicare.gov Compare Post Acute Care list provided to:: Patient Choice offered to / list presented to : Patient      Discharge Placement                       Discharge Plan and Services Additional resources added to the After Visit Summary for   In-house Referral: NA Discharge Planning Services: CM Consult Post Acute Care Choice: Home Health          DME Arranged: N/A DME Agency: NA       HH Arranged: PT, OT HH Agency: [facility] Date [facility] Agency Contacted: 08/07/23 Time HH Agency Contacted: 1443 Representative spoke with at [facility] Agency: [facility] rep  Social Drivers of Health (SDOH) Interventions SDOH Screenings   Food Insecurity: Patient Declined (08/06/2023)  Housing: Patient Declined (08/06/2023)  Transportation Needs: Patient Declined (08/06/2023)  Utilities: Patient Declined (08/06/2023)  Financial Resource Strain: Low Risk  (12/14/2018)  Physical Activity: Unknown (12/14/2018)  Social Connections: Patient Declined (08/06/2023)  Stress: No Stress Concern Present (12/14/2018)  Tobacco Use: Low Risk  (08/06/2023)  Health Literacy: Adequate Health Literacy (08/01/2023)   Received from [facility]  Recent Concern: Health  Literacy - Inadequate Health Literacy (07/20/2023)   Received from [facility]     Readmission Risk Interventions    05/02/2023    2:17 PM 01/29/2023   11:08 AM 10/31/2022   12:45 PM  Readmission Risk Prevention Plan  Transportation Screening Complete Complete Complete  Medication Review [employer]) Complete Complete Complete  PCP or Specialist appointment within 3-5 days of discharge  Complete   HRI or Home Care Consult Complete Complete Complete  SW Recovery Care/Counseling Consult Complete Complete Complete  Palliative Care Screening Not Applicable Not Applicable Not Applicable  Skilled Nursing Facility Not Applicable Not Applicable Not Applicable

## 2023-08-08 NOTE — Discharge Summary (Addendum)
Physician Discharge Summary  Shawna [name]:[id number] DOB: [birth year] DOA: 08/05/2023  PCP: [facility]  Admit date: 08/05/2023 Discharge date: 08/08/2023 30 Day Unplanned Readmission Risk Score    Flowsheet Row ED to Hosp-Admission (Current) from 08/05/2023 in [facility] 3E HF PCU  30 Day Unplanned Readmission Risk Score (%) 70.79 Filed at 08/06/2023 1600       This score is the patient's risk of an unplanned readmission within 30 days of being discharged (0 -100%). The score is based on dignosis, age, lab data, medications, orders, and past utilization.   Low:  0-14.9   Medium: 15-21.9   High: 22-29.9   Extreme: 30 and above          Admitted From: Home Disposition: Home  Recommendations for Outpatient Follow-up:  Follow up with PCP in 1-2 weeks Please obtain BMP/CBC in one week Please follow up with your PCP on the following pending results: Unresulted Labs (From admission, onward)    None         Home Health: Yes Equipment/Devices: None  Discharge Condition: Stable CODE STATUS: Full code Diet recommendation: Cardiac  Subjective: Seen and examined, patient has no complaints.  She is eager to go home.  Brief/Interim Summary: Shawna [name] is a 83 y.o. female with medical history significant of end-stage renal disease on dialysis, coronary artery disease status post CABG and drug-eluting stents, heart failure with reduced EF, diabetes, hypertension, GERD, atrial fibrillation, on metoprolol and amiodarone but not on anticoagulants.  She had missed hemodialysis a day prior to admission because of loose stools.  She went for hemodialysis the following day (on the day of admission).  During hemodialysis, patient was found to be bradycardic with heart rate in the mid 30s.  Dialysis was terminated prematurely and she was transported to the emergency department for further management.  She complained of fatigue.  No other symptoms reported.  Details of  hospitalization as below.   Sinus bradycardia, paroxysmal atrial fibrillation: Heart rate was in the 30s on admission. Metoprolol and amiodarone held.  She is not on long-term anticoagulation because of history of recurrent GI bleed.  Seen by cardiology, they also agreed with holding both of these medications, gradually, her heart rate improved, since patient remained asymptomatic so cardiology has cleared her for discharge with instructions to discontinue both of these medications.  Heart rate in upper 40s but she remains asymptomatic.  Discussed with Dr. Carolan Clines to double check and they are still okay with her going home.  Patient is being discharged home today.     Severe anemia: Hemoglobin dropped from 7.5-6.3 on admission.  S/p transfusion with 1 unit of PRBCs on 08/06/2023.  No evidence of bleeding at this time.  Hemoglobin over 8.     ESRD: Follow-up with nephrologist for hemodialysis, her schedule is Monday Wednesday Friday.   Hypomagnesemia: Replenished and resolved.   Hyperlipidemia: Continue statin.   Hypothyroidism: TSH 9.781, free T41.03.  Increase Synthroid from 75 mcg PTA dose to 125 mcg.   Comorbidities include seizure disorder on Keppra, history of recurrent GI bleed, CAD, hypertension, type 2 diabetes mellitus   Generalized weakness: Per husband, she has been very weak lately.  Seen by PT OT, they recommended home health.  Discharge plan was discussed with patient and/or family member and they verbalized understanding and agreed with it.  Discharge Diagnoses:  Principal Problem:   Bradycardia Active Problems:   End-stage renal disease on hemodialysis (HCC)   Diabetes mellitus  type 2 in nonobese [facility])   Acquired hypothyroidism   Chronic combined systolic and diastolic CHF (congestive heart failure) (HCC)   CAD (coronary artery disease)   Atrial fibrillation, chronic (HCC)    Discharge Instructions   Allergies as of 08/08/2023       Reactions   Amlodipine  Swelling   Aspirin Other (See Comments)   High Doses Mess up her stomach; "makes my bowels have blood in them". Takes 81 mg EC Aspirin    Nitrofurantoin Hives   Ranexa [ranolazine] Other (See Comments)   Myoclonus-hospitalized    Bactrim [sulfamethoxazole-trimethoprim] Rash   Iodinated Contrast Media Itching, Other (See Comments)   Iron Itching, Other (See Comments)   "they gave me iron in dialysis; had to give me Benadryl cause I had to have the iron" (05/02/2012)   Tylenol [acetaminophen] Itching, Other (See Comments)   Makes her feet on fire per pt - can tolerate per pt   Gabapentin Other (See Comments)   Unknown reaction   Sucroferric Oxyhydroxide Other (See Comments)   Unknown   Levaquin [levofloxacin In D5w] Rash   Levofloxacin Rash, Other (See Comments)   Plavix [clopidogrel Bisulfate] Rash   Protonix [pantoprazole Sodium] Rash   Venofer [ferric Oxide] Itching, Other (See Comments)   Patient reports using Benadryl prior to doses as [facility]        Medication List     STOP taking these medications    amiodarone 200 MG tablet Commonly known as: PACERONE   metoprolol tartrate 25 MG tablet Commonly known as: LOPRESSOR       TAKE these medications    calcitRIOL 0.25 MCG capsule Commonly known as: ROCALTROL Take 7 capsules (1.75 mcg total) by mouth every Monday, Wednesday, and Friday with hemodialysis.   cinacalcet 30 MG tablet Commonly known as: SENSIPAR Take 1 tablet (30 mg total) by mouth every Monday, Wednesday, and Friday with hemodialysis.   cyanocobalamin 1000 MCG/ML injection Commonly known as: VITAMIN B12 Inject 1,000 mcg into the muscle every 30 (thirty) days.   cyclobenzaprine 5 MG tablet Commonly known as: FLEXERIL Take 5 mg by mouth 3 (three) times daily as needed for muscle spasms.   folic acid 1 MG tablet Commonly known as: FOLVITE Take 1 mg by mouth daily.   gabapentin 100 MG capsule Commonly known as: NEURONTIN Take 1 capsule  (100 mg total) by mouth every evening.   hydrALAZINE 25 MG tablet Commonly known as: APRESOLINE Take 1 tablet (25 mg total) by mouth 2 (two) times daily. Hold for sbp <110   hydrOXYzine 25 MG tablet Commonly known as: ATARAX Take 25 mg by mouth every 6 (six) hours as needed for itching.   levETIRAcetam 500 MG tablet Commonly known as: KEPPRA Take 1 tablet (500 mg total) by mouth daily.   levothyroxine 125 MCG tablet Commonly known as: SYNTHROID Take 1 tablet (125 mcg total) by mouth daily at 6 (six) AM. Start taking on: August 09, 2023 What changed:  medication strength how much to take   multivitamin Tabs tablet Take 1 tablet by mouth daily.   nitroGLYCERIN 0.4 MG SL tablet Commonly known as: NITROSTAT Place 0.4 mg under the tongue every 5 (five) minutes as needed for chest pain.   omeprazole 40 MG capsule Commonly known as: PRILOSEC Take 40 mg by mouth daily.   ondansetron 4 MG disintegrating tablet Commonly known as: ZOFRAN-ODT Take 4 mg by mouth every 8 (eight) hours as needed for nausea or vomiting.   rosuvastatin  10 MG tablet Commonly known as: CRESTOR Take 1 tablet by mouth once daily   SandoSTATIN LAR [place] 20 MG injection Generic drug: octreotide Inject 20 mg into the muscle every 28 (twenty-eight) days.        Follow-up Information     [facility] Follow up in 1 week(s).   Contact information: [address] [place] [address] [phone]         [facility] Follow up.   Why: Agency will contact you to set your apt times Contact information: [phone]               Allergies  Allergen Reactions   Amlodipine Swelling   Aspirin Other (See Comments)    High Doses Mess up her stomach; "makes my bowels have blood in them". Takes 81 mg EC Aspirin    Nitrofurantoin Hives   Ranexa [Ranolazine] Other (See Comments)    Myoclonus-hospitalized    Bactrim [Sulfamethoxazole-Trimethoprim] Rash   Iodinated Contrast Media  Itching and Other (See Comments)   Iron Itching and Other (See Comments)    "they gave me iron in dialysis; had to give me Benadryl cause I had to have the iron" (05/02/2012)   Tylenol [Acetaminophen] Itching and Other (See Comments)    Makes her feet on fire per pt - can tolerate per pt   Gabapentin Other (See Comments)    Unknown reaction   Sucroferric Oxyhydroxide Other (See Comments)    Unknown   Levaquin [Levofloxacin In D5w] Rash   Levofloxacin Rash and Other (See Comments)   Plavix [Clopidogrel Bisulfate] Rash   Protonix [Pantoprazole Sodium] Rash   Venofer [Ferric Oxide] Itching and Other (See Comments)    Patient reports using Benadryl prior to doses as [facility]    Consultations: Cardiology   Procedures/Studies: DG Chest Portable 1 View Result Date: 08/05/2023 CLINICAL DATA:  Fluid overload EXAM: PORTABLE CHEST 1 VIEW COMPARISON:  Chest radiograph dated 05/31/2023 FINDINGS: Lines/tubes: Left internal jugular venous catheter tip projects over the right atrium. Left axillary stent graft. Lungs: Low lung volumes. Diffuse bilateral interstitial opacities. Bibasilar patchy opacities. Pleura: Small bilateral pleural effusions.  No pneumothorax. Heart/mediastinum: Similar enlarged cardiomediastinal silhouette. Bones: Median sternotomy wires are nondisplaced. Degenerative changes of the right shoulder. IMPRESSION: 1. Cardiomegaly with pulmonary edema and small bilateral pleural effusions. 2. Bibasilar patchy opacities, likely atelectasis. Aspiration or pneumonia can be considered in the appropriate clinical setting. Electronically Signed   By: Agustin Cree M.D.   On: 08/05/2023 14:32     Discharge Exam: Vitals:   08/08/23 0504 08/08/23 0715  BP: (!) 131/45 (!) 155/48  Pulse: (!) 52 (!) 49  Resp: 18 17  Temp: 97.9 F (36.6 C) 98 F (36.7 C)  SpO2: 90% 93%   Vitals:   08/07/23 1942 08/08/23 0019 08/08/23 0504 08/08/23 0715  BP: (!) 161/50 (!) 145/40 (!) 131/45 (!) 155/48   Pulse: (!) 49 (!) 48 (!) 52 (!) 49  Resp: 14 14 18 17   Temp: 97.7 F (36.5 C) 98.2 F (36.8 C) 97.9 F (36.6 C) 98 F (36.7 C)  TempSrc: Oral Oral Oral Oral  SpO2: 98% 92% 90% 93%  Weight:   51.5 kg   Height:        General: Pt is alert, awake, not in acute distress Cardiovascular: Sinus bradycardia, S1/S2 +, no rubs, no gallops Respiratory: CTA bilaterally, no wheezing, no rhonchi Abdominal: Soft, NT, ND, bowel sounds + Extremities: no edema, no cyanosis  The results of significant diagnostics from this hospitalization (including imaging, microbiology, ancillary and laboratory) are listed below for reference.     Microbiology: No results found for this or any previous visit (from the past 240 hours).   Labs: BNP (last 3 results) Recent Labs    08/26/22 1410 10/23/22 1420 05/13/23 1500  BNP 1,590.0* 2,205.0* 3,351.0*   Basic Metabolic Panel: Recent Labs  Lab 08/05/23 1441 08/06/23 0213 08/06/23 1654 08/07/23 0512 08/08/23 0606 08/08/23 0848  NA 135 131*  --  135  --  134*  K 3.6 3.6  --  3.9  --  3.7  CL 97* 98  --  101  --  98  CO2 24 22  --  22  --  24  GLUCOSE 104* 113*  --  83  --  75  BUN 29* 30*  --  43*  --  22  CREATININE 4.33* 4.90*  --  6.53*  --  4.52*  CALCIUM 8.5* 8.3*  --  8.4*  --  8.5*  MG  --  1.4*  --  1.5* 2.8*  --   PHOS  --   --  4.4  --   --   --    Liver Function Tests: Recent Labs  Lab 08/06/23 1654  ALBUMIN 2.6*   No results for input(s): "LIPASE", "AMYLASE" in the last 168 hours. No results for input(s): "AMMONIA" in the last 168 hours. CBC: Recent Labs  Lab 08/05/23 1441 08/06/23 0213 08/07/23 0512 08/08/23 0848  WBC 4.2 5.9 4.0 4.8  NEUTROABS  --   --   --  4.0  HGB 7.5* 6.3* 7.8* 8.5*  HCT 22.9* 18.7* 23.5* 25.5*  MCV 115.7* 114.7* 110.3* 108.5*  PLT 162 183 173 190   Cardiac Enzymes: No results for input(s): "CKTOTAL", "CKMB", "CKMBINDEX", "TROPONINI" in the last 168 hours. BNP: Invalid input(s):  "POCBNP" CBG: No results for input(s): "GLUCAP" in the last 168 hours. D-Dimer No results for input(s): "DDIMER" in the last 72 hours. Hgb A1c No results for input(s): "HGBA1C" in the last 72 hours. Lipid Profile No results for input(s): "CHOL", "HDL", "LDLCALC", "TRIG", "CHOLHDL", "LDLDIRECT" in the last 72 hours. Thyroid function studies Recent Labs    08/05/23 1441  TSH 9.781*   Anemia work up No results for input(s): "VITAMINB12", "FOLATE", "FERRITIN", "TIBC", "IRON", "RETICCTPCT" in the last 72 hours. Urinalysis    Component Value Date/Time   COLORURINE YELLOW 12/16/2012 1919   APPEARANCEUR CLOUDY (A) 12/16/2012 1919   LABSPEC 1.009 12/16/2012 1919   PHURINE 7.5 12/16/2012 1919   GLUCOSEU NEGATIVE 12/16/2012 1919   HGBUR TRACE (A) 12/16/2012 1919   BILIRUBINUR NEGATIVE 12/16/2012 1919   KETONESUR NEGATIVE 12/16/2012 1919   PROTEINUR 100 (A) 12/16/2012 1919   UROBILINOGEN 0.2 12/16/2012 1919   NITRITE NEGATIVE 12/16/2012 1919   LEUKOCYTESUR SMALL (A) 12/16/2012 1919   Sepsis Labs Recent Labs  Lab 08/05/23 1441 08/06/23 0213 08/07/23 0512 08/08/23 0848  WBC 4.2 5.9 4.0 4.8   Microbiology No results found for this or any previous visit (from the past 240 hours).  FURTHER DISCHARGE INSTRUCTIONS:   Get Medicines reviewed and adjusted: Please take all your medications with you for your next visit with your Primary MD   Laboratory/radiological data: Please request your Primary MD to go over all hospital tests and procedure/radiological results at the follow up, please ask your Primary MD to get all Hospital records sent to his/her office.   In some cases, they will be  blood work, cultures and biopsy results pending at the time of your discharge. Please request that your primary care M.D. goes through all the records of your hospital data and follows up on these results.   Also Note the following: If you experience worsening of your admission symptoms, develop  shortness of breath, life threatening emergency, suicidal or homicidal thoughts you must seek medical attention immediately by calling 911 or calling your MD immediately  if symptoms less severe.   You must read complete instructions/literature along with all the possible adverse reactions/side effects for all the Medicines you take and that have been prescribed to you. Take any new Medicines after you have completely understood and accpet all the possible adverse reactions/side effects.    Do not drive when taking Pain medications or sleeping medications (Benzodaizepines)   Do not take more than prescribed Pain, Sleep and Anxiety Medications. It is not advisable to combine anxiety,sleep and pain medications without talking with your primary care practitioner   Special Instructions: If you have smoked or chewed Tobacco  in the last 2 yrs please stop smoking, stop any regular Alcohol  and or any Recreational drug use.   Wear Seat belts while driving.   Please note: You were cared for by a hospitalist during your hospital stay. Once you are discharged, your primary care physician will handle any further medical issues. Please note that NO REFILLS for any discharge medications will be authorized once you are discharged, as it is imperative that you return to your primary care physician (or establish a relationship with a primary care physician if you do not have one) for your post hospital discharge needs so that they can reassess your need for medications and monitor your lab values  Time coordinating discharge: Over 30 minutes  SIGNED:   Hughie Closs, MD  [facility] 08/08/2023, 11:21 AM *Please note that this is a verbal dictation therefore any spelling or grammatical errors are due to the "Dragon Medical One" system interpretation. If 7PM-7AM, please contact night-coverage [email or web address]

## 2023-08-19 ENCOUNTER — Inpatient Hospital Stay (HOSPITAL_COMMUNITY)
Admission: EM | Admit: 2023-08-19 | Discharge: 2023-08-24 | DRG: 871 | Disposition: A | Discharge status: Home Health | Payer: Medicare Other | Attending: Internal Medicine | Admitting: Internal Medicine

## 2023-08-19 ENCOUNTER — Emergency Department (HOSPITAL_COMMUNITY): Payer: Medicare Other

## 2023-08-19 ENCOUNTER — Other Ambulatory Visit: Payer: Self-pay

## 2023-08-19 ENCOUNTER — Encounter (HOSPITAL_COMMUNITY): Payer: Self-pay | Admitting: Emergency Medicine

## 2023-08-19 DIAGNOSIS — E039 Hypothyroidism, unspecified: Secondary | ICD-10-CM | POA: Diagnosis present

## 2023-08-19 DIAGNOSIS — Y95 Nosocomial condition: Secondary | ICD-10-CM | POA: Diagnosis present

## 2023-08-19 DIAGNOSIS — I5043 Acute on chronic combined systolic (congestive) and diastolic (congestive) heart failure: Secondary | ICD-10-CM | POA: Diagnosis present

## 2023-08-19 DIAGNOSIS — N2581 Secondary hyperparathyroidism of renal origin: Secondary | ICD-10-CM | POA: Diagnosis present

## 2023-08-19 DIAGNOSIS — Z90710 Acquired absence of both cervix and uterus: Secondary | ICD-10-CM

## 2023-08-19 DIAGNOSIS — J180 Bronchopneumonia, unspecified organism: Principal | ICD-10-CM | POA: Diagnosis present

## 2023-08-19 DIAGNOSIS — R6520 Severe sepsis without septic shock: Secondary | ICD-10-CM | POA: Insufficient documentation

## 2023-08-19 DIAGNOSIS — Z79890 Hormone replacement therapy (postmenopausal): Secondary | ICD-10-CM

## 2023-08-19 DIAGNOSIS — R9431 Abnormal electrocardiogram [ECG] [EKG]: Secondary | ICD-10-CM

## 2023-08-19 DIAGNOSIS — R0902 Hypoxemia: Secondary | ICD-10-CM

## 2023-08-19 DIAGNOSIS — J810 Acute pulmonary edema: Secondary | ICD-10-CM

## 2023-08-19 DIAGNOSIS — J9621 Acute and chronic respiratory failure with hypoxia: Secondary | ICD-10-CM | POA: Diagnosis present

## 2023-08-19 DIAGNOSIS — Z833 Family history of diabetes mellitus: Secondary | ICD-10-CM

## 2023-08-19 DIAGNOSIS — R7989 Other specified abnormal findings of blood chemistry: Secondary | ICD-10-CM | POA: Diagnosis present

## 2023-08-19 DIAGNOSIS — Z228 Carrier of other infectious diseases: Secondary | ICD-10-CM

## 2023-08-19 DIAGNOSIS — Z951 Presence of aortocoronary bypass graft: Secondary | ICD-10-CM

## 2023-08-19 DIAGNOSIS — E872 Acidosis, unspecified: Secondary | ICD-10-CM | POA: Diagnosis present

## 2023-08-19 DIAGNOSIS — J90 Pleural effusion, not elsewhere classified: Secondary | ICD-10-CM | POA: Diagnosis not present

## 2023-08-19 DIAGNOSIS — E119 Type 2 diabetes mellitus without complications: Secondary | ICD-10-CM | POA: Diagnosis not present

## 2023-08-19 DIAGNOSIS — I132 Hypertensive heart and chronic kidney disease with heart failure and with stage 5 chronic kidney disease, or end stage renal disease: Secondary | ICD-10-CM | POA: Diagnosis present

## 2023-08-19 DIAGNOSIS — J189 Pneumonia, unspecified organism: Secondary | ICD-10-CM | POA: Diagnosis present

## 2023-08-19 DIAGNOSIS — Z882 Allergy status to sulfonamides status: Secondary | ICD-10-CM

## 2023-08-19 DIAGNOSIS — Z992 Dependence on renal dialysis: Secondary | ICD-10-CM

## 2023-08-19 DIAGNOSIS — N186 End stage renal disease: Secondary | ICD-10-CM | POA: Diagnosis present

## 2023-08-19 DIAGNOSIS — Z8543 Personal history of malignant neoplasm of ovary: Secondary | ICD-10-CM

## 2023-08-19 DIAGNOSIS — Z881 Allergy status to other antibiotic agents status: Secondary | ICD-10-CM

## 2023-08-19 DIAGNOSIS — I480 Paroxysmal atrial fibrillation: Secondary | ICD-10-CM | POA: Diagnosis present

## 2023-08-19 DIAGNOSIS — I081 Rheumatic disorders of both mitral and tricuspid valves: Secondary | ICD-10-CM | POA: Diagnosis present

## 2023-08-19 DIAGNOSIS — R059 Cough, unspecified: Secondary | ICD-10-CM | POA: Diagnosis present

## 2023-08-19 DIAGNOSIS — I2510 Atherosclerotic heart disease of native coronary artery without angina pectoris: Secondary | ICD-10-CM | POA: Diagnosis present

## 2023-08-19 DIAGNOSIS — Z8719 Personal history of other diseases of the digestive system: Secondary | ICD-10-CM

## 2023-08-19 DIAGNOSIS — I160 Hypertensive urgency: Secondary | ICD-10-CM | POA: Diagnosis present

## 2023-08-19 DIAGNOSIS — D631 Anemia in chronic kidney disease: Secondary | ICD-10-CM | POA: Diagnosis present

## 2023-08-19 DIAGNOSIS — I5042 Chronic combined systolic (congestive) and diastolic (congestive) heart failure: Secondary | ICD-10-CM | POA: Diagnosis present

## 2023-08-19 DIAGNOSIS — Z79899 Other long term (current) drug therapy: Secondary | ICD-10-CM

## 2023-08-19 DIAGNOSIS — J9601 Acute respiratory failure with hypoxia: Secondary | ICD-10-CM | POA: Diagnosis not present

## 2023-08-19 DIAGNOSIS — Z8711 Personal history of peptic ulcer disease: Secondary | ICD-10-CM

## 2023-08-19 DIAGNOSIS — J811 Chronic pulmonary edema: Secondary | ICD-10-CM | POA: Diagnosis present

## 2023-08-19 DIAGNOSIS — M1990 Unspecified osteoarthritis, unspecified site: Secondary | ICD-10-CM | POA: Diagnosis present

## 2023-08-19 DIAGNOSIS — A419 Sepsis, unspecified organism: Secondary | ICD-10-CM | POA: Diagnosis not present

## 2023-08-19 DIAGNOSIS — Z955 Presence of coronary angioplasty implant and graft: Secondary | ICD-10-CM

## 2023-08-19 DIAGNOSIS — Z83438 Family history of other disorder of lipoprotein metabolism and other lipidemia: Secondary | ICD-10-CM

## 2023-08-19 DIAGNOSIS — Z1152 Encounter for screening for COVID-19: Secondary | ICD-10-CM | POA: Diagnosis not present

## 2023-08-19 DIAGNOSIS — E1122 Type 2 diabetes mellitus with diabetic chronic kidney disease: Secondary | ICD-10-CM | POA: Diagnosis present

## 2023-08-19 DIAGNOSIS — E782 Mixed hyperlipidemia: Secondary | ICD-10-CM | POA: Diagnosis present

## 2023-08-19 DIAGNOSIS — Z8249 Family history of ischemic heart disease and other diseases of the circulatory system: Secondary | ICD-10-CM | POA: Diagnosis not present

## 2023-08-19 DIAGNOSIS — Z85038 Personal history of other malignant neoplasm of large intestine: Secondary | ICD-10-CM

## 2023-08-19 DIAGNOSIS — Z886 Allergy status to analgesic agent status: Secondary | ICD-10-CM

## 2023-08-19 DIAGNOSIS — Z888 Allergy status to other drugs, medicaments and biological substances status: Secondary | ICD-10-CM

## 2023-08-19 DIAGNOSIS — Z8673 Personal history of transient ischemic attack (TIA), and cerebral infarction without residual deficits: Secondary | ICD-10-CM

## 2023-08-19 DIAGNOSIS — K219 Gastro-esophageal reflux disease without esophagitis: Secondary | ICD-10-CM | POA: Diagnosis present

## 2023-08-19 DIAGNOSIS — R079 Chest pain, unspecified: Secondary | ICD-10-CM | POA: Diagnosis not present

## 2023-08-19 DIAGNOSIS — R197 Diarrhea, unspecified: Secondary | ICD-10-CM | POA: Diagnosis present

## 2023-08-19 DIAGNOSIS — Z8701 Personal history of pneumonia (recurrent): Secondary | ICD-10-CM

## 2023-08-19 DIAGNOSIS — F419 Anxiety disorder, unspecified: Secondary | ICD-10-CM | POA: Diagnosis present

## 2023-08-19 DIAGNOSIS — I10 Essential (primary) hypertension: Secondary | ICD-10-CM

## 2023-08-19 LAB — CBC
HCT: 25.3 % — ABNORMAL LOW (ref 36.0–46.0)
Hemoglobin: 8.1 g/dL — ABNORMAL LOW (ref 12.0–15.0)
MCH: 36.7 pg — ABNORMAL HIGH (ref 26.0–34.0)
MCHC: 32 g/dL (ref 30.0–36.0)
MCV: 114.5 fL — ABNORMAL HIGH (ref 80.0–100.0)
Platelets: 230 10*3/uL (ref 150–400)
RBC: 2.21 MIL/uL — ABNORMAL LOW (ref 3.87–5.11)
RDW: 23.7 % — ABNORMAL HIGH (ref 11.5–15.5)
WBC: 6.8 10*3/uL (ref 4.0–10.5)
nRBC: 0 % (ref 0.0–0.2)

## 2023-08-19 LAB — BASIC METABOLIC PANEL
Anion gap: 12 (ref 5–15)
BUN: 22 mg/dL (ref 8–23)
CO2: 29 mmol/L (ref 22–32)
Calcium: 9.2 mg/dL (ref 8.9–10.3)
Chloride: 97 mmol/L — ABNORMAL LOW (ref 98–111)
Creatinine, Ser: 3.36 mg/dL — ABNORMAL HIGH (ref 0.44–1.00)
GFR, Estimated: 13 mL/min — ABNORMAL LOW (ref >60)
Glucose, Bld: 185 mg/dL — ABNORMAL HIGH (ref 70–99)
Potassium: 3.5 mmol/L (ref 3.5–5.1)
Sodium: 138 mmol/L (ref 135–145)

## 2023-08-19 LAB — RESP PANEL BY RT-PCR (RSV, FLU A&B, COVID)  RVPGX2
Influenza A by PCR: NEGATIVE
Influenza B by PCR: NEGATIVE
Resp Syncytial Virus by PCR: NEGATIVE
SARS Coronavirus 2 by RT PCR: NEGATIVE

## 2023-08-19 LAB — TROPONIN I (HIGH SENSITIVITY)
Troponin I (High Sensitivity): 89 ng/L — ABNORMAL HIGH (ref <18)
Troponin I (High Sensitivity): 129 ng/L (ref <18)

## 2023-08-19 LAB — PROTIME-INR
INR: 1 (ref 0.8–1.2)
Prothrombin Time: 13.9 s (ref 11.4–15.2)

## 2023-08-19 LAB — LACTIC ACID, PLASMA
Lactic Acid, Venous: 1.8 mmol/L (ref 0.5–1.9)
Lactic Acid, Venous: 2 mmol/L (ref 0.5–1.9)

## 2023-08-19 MED ORDER — IPRATROPIUM-ALBUTEROL 0.5-2.5 (3) MG/3ML IN SOLN
3.0000 mL | Freq: Once | RESPIRATORY_TRACT | Status: AC
  Administered 2023-08-19: 3 mL via RESPIRATORY_TRACT
  Filled 2023-08-19: qty 3

## 2023-08-19 MED ORDER — SODIUM CHLORIDE 0.9 % IV SOLN
500.0000 mg | Freq: Once | INTRAVENOUS | Status: AC
  Administered 2023-08-19: 500 mg via INTRAVENOUS
  Filled 2023-08-19: qty 5

## 2023-08-19 MED ORDER — CHLORHEXIDINE GLUCONATE CLOTH 2 % EX PADS
6.0000 | MEDICATED_PAD | Freq: Every day | CUTANEOUS | Status: DC
  Administered 2023-08-20 – 2023-08-24 (×4): 6 via TOPICAL

## 2023-08-19 MED ORDER — HYDRALAZINE HCL 20 MG/ML IJ SOLN
10.0000 mg | Freq: Once | INTRAMUSCULAR | Status: AC
  Administered 2023-08-19: 10 mg via INTRAVENOUS
  Filled 2023-08-19: qty 1

## 2023-08-19 MED ORDER — SODIUM CHLORIDE 0.9 % IV SOLN
1.0000 g | Freq: Once | INTRAVENOUS | Status: AC
  Administered 2023-08-19: 1 g via INTRAVENOUS
  Filled 2023-08-19: qty 10

## 2023-08-19 NOTE — H&P (Addendum)
 History and Physical    Patient: Shawna [name] FMW:[id number] DOB: [birth year] DOA: 08/19/2023 DOS: the patient was seen and examined on 08/20/2023 PCP: [facility]  Patient coming from: Home  Chief Complaint:  Chief Complaint  Patient presents with   Chest Pain   Cough   Weakness   HPI: Shawna [name] is a 83 y.o. female with medical history significant of ESRD on hemodialysis (MWF), CAD status post CABG, HFrEF, T2DM, hypertension, GERD, atrial fibrillation (not on anticoagulants) who presents to the emergency department with complaint of cough which started today.  She complained of 2 to 3-day onset of not feeling well and complained of body aches, productive cough (unsure of sputum color), subjective fever.  Shortness of breath worsened today, so she presented to the ED for further evaluation and management.  ED Course: In the emergency department, she was tachypneic, tachycardic, BP was 178/80, O2 sat was 89% on room air, this improved to 100% on supplemental oxygen  via [place] at 2 LPM.  Temperature 98.6 F.  Workup in the ED showed hemoglobin 8.1, hematocrit 25.3, MCV 114.5.  BMP was normal except for chloride 97, blood glucose 185, creatinine 3.36, GFR 13.  Lactic acid 1.8 > 2.0.  Troponin 89 >129.  Influenza A, B, SARS, respiratory, RSV was negative. CT chest without contrast showed bibasilar airspace disease and bronchial wall thickening consistent with multifocal bronchopneumonia.  Small free-flowing bilateral pleural effusions, right greater than left. Chest x-ray showed vascular congestion with persistent bibasilar airspace disease and small bilateral pleural effusions.  Overall findings are consistent with pulmonary edema, and/or underlying infection will be difficult to exclude.  No significant change since prior exam Patient was started on IV ceftriaxone  and azithromycin , IV hydralazine  10 mg x 1 was given due to elevated BP and breathing treatment was provided. Hospitalist was  asked admit patient for further evaluation and management.   Review of Systems: Review of systems as noted in the HPI. All other systems reviewed and are negative.   Past Medical History:  Diagnosis Date   Acute on chronic respiratory failure with hypoxia (HCC) 10/10/2016   Anxiety    Arthritis    AVM (arteriovenous malformation) of colon    CAD (coronary artery disease)    a. s/p CABG in 2013 b. DES to D1 in 10/2016. c. cath in 07/2018 showing patent grafts with occlusion of D1 at prior stent site and progression of PDA disease --> medical management recommended   Carotid artery disease (HCC)    a. 60-79% LICA, 03/2012    Chronic bronchitis (HCC)    Chronic HFrEF (heart failure with reduced ejection fraction) (HCC)    Colon cancer (HCC) 1992   Esophageal stricture    ESRD on hemodialysis (HCC)    ESRD due to HTN, started dialysis 2011 and gets HD [facility] with Dr Edwardo on MWF schedule.  Access is LUA AVF as of Sept 2014.    GERD (gastroesophageal reflux disease)    High cholesterol 12/2011   History of blood transfusion 07/2011; 12/2011; 01/2012 X 2; 04/2012   History of gout    History of lower GI bleeding    Hypertension    Iron  deficiency anemia    Jugular vein occlusion, right (HCC)    Mitral regurgitation    a. Moderate by echo, 02/2012   Mitral valve disease    NSVT (nonsustained ventricular tachycardia) (HCC)    Ovarian cancer (HCC) 1992   PAF (paroxysmal atrial fibrillation) (HCC)  Pneumonia ~ 2009   PUD (peptic ulcer disease)    TIA (transient ischemic attack)    Tricuspid valve disease    Past Surgical History:  Procedure Laterality Date   A/V FISTULAGRAM Left 10/04/2022   Procedure: A/V Fistulagram;  Surgeon: Jama Cordella MATSU, MD;  Location: [facility] INVASIVE CV LAB;  Service: Cardiovascular;  Laterality: Left;   A/V SHUNTOGRAM Left 03/19/2019   Procedure: A/V SHUNTOGRAM;  Surgeon: Jama Cordella MATSU, MD;  Location: [facility] INVASIVE CV LAB;  Service:  Cardiovascular;  Laterality: Left;   ABDOMINAL HYSTERECTOMY  1992   APPENDECTOMY  06/1990   AV FISTULA PLACEMENT  07/2009   left upper arm   AV FISTULA PLACEMENT Right 09/06/2016   Procedure: RIGHT FOREARM ARTERIOVENOUS (AV) GRAFT;  Surgeon: Carlin FORBES Haddock, MD;  [facility]: [facility] OR;  Service: Vascular;  Laterality: Right;   AV FISTULA PLACEMENT N/A 02/24/2017   Procedure: INSERTION OF ARTERIOVENOUS (AV) GORE-TEX GRAFT ARM (BRACHIAL AXILLARY);  Surgeon: Jama Cordella MATSU, MD;  Location: [facility] ORS;  Service: Vascular;  Laterality: N/A;   AVGG REMOVAL Right 09/06/2016   Procedure: REMOVAL OF Right Arm ARTERIOVENOUS GORETEX GRAFT and Vein Patch angioplasty of brachial artery;  Surgeon: Lonni GORMAN Blade, MD;  Location: [facility] OR;  Service: Vascular;  Laterality: Right;   BIOPSY  09/26/2019   Procedure: BIOPSY;  Surgeon: Golda Claudis PENNER, MD;  [facility];  Service: Endoscopy;;   BIOPSY  11/01/2022   Procedure: BIOPSY;  Surgeon: Eartha Angelia Sieving, MD;  [facility];  Service: Gastroenterology;;   COLON RESECTION  1992   COLON SURGERY     COLONOSCOPY N/A 03/09/2019   Procedure: COLONOSCOPY;  Surgeon: Golda Claudis PENNER, MD;  [facility];  Service: Endoscopy;  Laterality: N/A;   COLONOSCOPY N/A 08/11/2022   Procedure: COLONOSCOPY;  Surgeon: Albertus Gordy HERO, MD;  Location: [facility] ENDOSCOPY;  Service: Gastroenterology;  Laterality: N/A;   COLONOSCOPY WITH PROPOFOL  N/A 06/08/2021   Procedure: COLONOSCOPY WITH PROPOFOL ;  Surgeon: Eartha Angelia Sieving, MD;  [facility];  Service: Gastroenterology;  Laterality: N/A;  9:05 /Patient is on dialysis Mon Wed Fri   CORONARY ANGIOPLASTY WITH STENT PLACEMENT  12/15/11   2   CORONARY ANGIOPLASTY WITH STENT PLACEMENT  y/2013   1; makes total of 3 (05/02/2012)   CORONARY ARTERY BYPASS GRAFT  06/13/2012   Procedure: CORONARY ARTERY BYPASS GRAFTING (CABG);  Surgeon: Dallas KATHEE Jude, MD;  Location: [facility] OR;  Service: Open  Heart Surgery;  Laterality: N/A;  cabg x four;  using left internal mammary artery, and left leg greater saphenous vein harvested endoscopically   CORONARY STENT INTERVENTION N/A 10/13/2016   Procedure: Coronary Stent Intervention;  Surgeon: Debby DELENA Sor, MD;  [facility];  Service: Cardiovascular;  Laterality: N/A;   DIALYSIS/PERMA CATHETER REMOVAL N/A 04/18/2017   Procedure: DIALYSIS/PERMA CATHETER REMOVAL;  Surgeon: Jama Cordella MATSU, MD;  Location: [facility] INVASIVE CV LAB;  Service: Cardiovascular;  Laterality: N/A;   DILATION AND CURETTAGE OF UTERUS     ENTEROSCOPY N/A 06/08/2021   Procedure: PUSH ENTEROSCOPY;  Surgeon: Eartha Angelia Sieving, MD;  [facility];  Service: Gastroenterology;  Laterality: N/A;   ENTEROSCOPY N/A 11/01/2022   Procedure: ENTEROSCOPY;  Surgeon: Eartha Angelia Sieving, MD;  [facility];  Service: Gastroenterology;  Laterality: N/A;   ENTEROSCOPY N/A 12/01/2022   Procedure: ENTEROSCOPY;  Surgeon: Leigh Elspeth SQUIBB, MD;  Location: [facility] ENDOSCOPY;  Service: Gastroenterology;  Laterality: N/A;  ESOPHAGOGASTRODUODENOSCOPY  01/20/2012   Procedure: ESOPHAGOGASTRODUODENOSCOPY (EGD);  Surgeon: Gwendlyn ONEIDA Buddy, MD,FACG;  Location: [facility] ENDOSCOPY;  Service: Endoscopy;  Laterality: N/A;   ESOPHAGOGASTRODUODENOSCOPY N/A 03/26/2013   Procedure: ESOPHAGOGASTRODUODENOSCOPY (EGD);  Surgeon: Norleen LOISE Kiang, MD;  Location: [facility] ENDOSCOPY;  Service: Endoscopy;  Laterality: N/A;   ESOPHAGOGASTRODUODENOSCOPY N/A 04/30/2015   Procedure: ESOPHAGOGASTRODUODENOSCOPY (EGD);  Surgeon: Claudis RAYMOND Rivet, MD;  [facility];  Service: Endoscopy;  Laterality: N/A;  1pm - moved to 10/20 @ 1:10   ESOPHAGOGASTRODUODENOSCOPY N/A 07/29/2016   Procedure: ESOPHAGOGASTRODUODENOSCOPY (EGD);  Surgeon: Elspeth Deward Naval, MD;  Location: [facility] ENDOSCOPY;  Service: Gastroenterology;  Laterality: N/A;  enteroscopy   ESOPHAGOGASTRODUODENOSCOPY N/A 09/26/2019    Procedure: ESOPHAGOGASTRODUODENOSCOPY (EGD);  Surgeon: Rivet Claudis RAYMOND, MD;  [facility];  Service: Endoscopy;  Laterality: N/A;  1250   ESOPHAGOGASTRODUODENOSCOPY N/A 08/11/2022   Procedure: ESOPHAGOGASTRODUODENOSCOPY (EGD);  Surgeon: Albertus Gordy HERO, MD;  Location: [facility] ENDOSCOPY;  Service: Gastroenterology;  Laterality: N/A;   ESOPHAGOGASTRODUODENOSCOPY (EGD) WITH PROPOFOL  N/A 02/05/2021   Procedure: ESOPHAGOGASTRODUODENOSCOPY (EGD) WITH PROPOFOL ;  Surgeon: Cindie Carlin POUR, DO;  [facility];  Service: Endoscopy;  Laterality: N/A;   ESOPHAGOGASTRODUODENOSCOPY (EGD) WITH PROPOFOL  N/A 08/27/2022   Procedure: ESOPHAGOGASTRODUODENOSCOPY (EGD) WITH PROPOFOL ;  Surgeon: Cindie Carlin POUR, DO;  [facility];  Service: Endoscopy;  Laterality: N/A;   GIVENS CAPSULE STUDY N/A 03/07/2019   Procedure: GIVENS CAPSULE STUDY;  Surgeon: Rivet Claudis RAYMOND, MD;  [facility];  Service: Endoscopy;  Laterality: N/A;  7:30   GIVENS CAPSULE STUDY N/A 04/22/2021   Procedure: GIVENS CAPSULE STUDY;  Surgeon: Rivet Claudis RAYMOND, MD;  [facility];  Service: Endoscopy;  Laterality: N/A;  7:30   GIVENS CAPSULE STUDY N/A 08/27/2022   Procedure: GIVENS CAPSULE STUDY;  Surgeon: Cindie Carlin POUR, DO;  [facility];  Service: Endoscopy;  Laterality: N/A;   HOT HEMOSTASIS N/A 08/11/2022   Procedure: HOT HEMOSTASIS (ARGON PLASMA COAGULATION/BICAP);  Surgeon: Albertus Gordy HERO, MD;  Location: [facility] ENDOSCOPY;  Service: Gastroenterology;  Laterality: N/A;   HOT HEMOSTASIS N/A 12/01/2022   Procedure: HOT HEMOSTASIS (ARGON PLASMA COAGULATION/BICAP);  Surgeon: Naval Elspeth SQUIBB, MD;  Location: [facility] ENDOSCOPY;  Service: Gastroenterology;  Laterality: N/A;   INSERTION OF DIALYSIS CATHETER N/A 10/05/2020   Procedure: ABORTED TUNNELED DIALYSIS CATHETER PLACEMENT RIGHT INTERNAL JUGULAR VEIN ;  Surgeon: Kallie Manuelita BROCKS, MD;  Location: [facility];  Service: General;  Laterality: N/A;    INTRAOPERATIVE TRANSESOPHAGEAL ECHOCARDIOGRAM  06/13/2012   Procedure: INTRAOPERATIVE TRANSESOPHAGEAL ECHOCARDIOGRAM;  Surgeon: Dallas KATHEE Jude, MD;  Location: [facility] OR;  Service: Open Heart Surgery;  Laterality: N/A;   IR DIALY SHUNT INTRO NEEDLE/INTRACATH INITIAL W/IMG LEFT Left 10/06/2020   IR FLUORO GUIDE CV LINE RIGHT  06/17/2020   IR FLUORO GUIDE CV LINE RIGHT  11/12/2022   IR GENERIC HISTORICAL  07/26/2016   IR FLUORO GUIDE CV LINE RIGHT 07/26/2016 Ozell Specking, MD [facility]   IR GENERIC HISTORICAL  07/26/2016   IR US  GUIDE VASC ACCESS RIGHT 07/26/2016 Ozell Specking, MD [facility]   IR GENERIC HISTORICAL  08/02/2016   IR US  GUIDE VASC ACCESS RIGHT 08/02/2016 Ozell Specking, MD [facility]   IR GENERIC HISTORICAL  08/02/2016   IR FLUORO GUIDE CV LINE RIGHT 08/02/2016 Ozell Specking, MD [facility]   IR RADIOLOGY PERIPHERAL GUIDED IV START  03/28/2017   IR REMOVAL TUN CV CATH W/O FL  08/11/2020   IR REMOVAL TUN CV CATH  W/O FL  11/15/2022   IR THROMBECTOMY AV FISTULA W/THROMBOLYSIS INC/SHUNT/IMG LEFT Left 06/17/2020   IR US  GUIDE VASC ACCESS LEFT  06/17/2020   IR US  GUIDE VASC ACCESS RIGHT  03/28/2017   IR US  GUIDE VASC ACCESS RIGHT  06/17/2020   IR US  GUIDE VASC ACCESS RIGHT  11/12/2022   LEFT HEART CATH AND CORONARY ANGIOGRAPHY N/A 09/20/2016   Procedure: Left Heart Cath and Coronary Angiography;  Surgeon: Victory LELON Sharps, MD;  Location: [facility] INVASIVE CV LAB;  Service: Cardiovascular;  Laterality: N/A;   LEFT HEART CATH AND CORS/GRAFTS ANGIOGRAPHY N/A 10/13/2016   Procedure: Left Heart Cath and Cors/Grafts Angiography;  Surgeon: Debby DELENA Sor, MD;  [facility]: [facility];  Service: Cardiovascular;  Laterality: N/A;   LEFT HEART CATH AND CORS/GRAFTS ANGIOGRAPHY N/A 07/13/2018   Procedure: LEFT HEART CATH AND CORS/GRAFTS ANGIOGRAPHY;  Surgeon: Jordan, Peter M, MD;  Location: [facility] INVASIVE CV LAB;  Service: Cardiovascular;  Laterality: N/A;   LEFT HEART CATH AND CORS/GRAFTS ANGIOGRAPHY N/A 07/22/2021    Procedure: LEFT HEART CATH AND CORS/GRAFTS ANGIOGRAPHY;  Surgeon: Court Dorn PARAS, MD;  [facility] CV LAB;  Service: Cardiovascular;  Laterality: N/A;   LEFT HEART CATHETERIZATION WITH CORONARY ANGIOGRAM N/A 12/15/2011   Procedure: LEFT HEART CATHETERIZATION WITH CORONARY ANGIOGRAM;  Surgeon: Lonni JONETTA Cash, MD;  Location: [facility] CATH LAB;  Service: Cardiovascular;  Laterality: N/A;   LEFT HEART CATHETERIZATION WITH CORONARY ANGIOGRAM N/A 01/10/2012   Procedure: LEFT HEART CATHETERIZATION WITH CORONARY ANGIOGRAM;  Surgeon: Peter M Jordan, MD;  Location: [facility] CATH LAB;  Service: Cardiovascular;  Laterality: N/A;   LEFT HEART CATHETERIZATION WITH CORONARY ANGIOGRAM N/A 06/08/2012   Procedure: LEFT HEART CATHETERIZATION WITH CORONARY ANGIOGRAM;  Surgeon: Lonni JONETTA Cash, MD;  Location: [facility] CATH LAB;  Service: Cardiovascular;  Laterality: N/A;   LEFT HEART CATHETERIZATION WITH CORONARY/GRAFT ANGIOGRAM N/A 12/10/2013   Procedure: LEFT HEART CATHETERIZATION WITH EL BILE;  Surgeon: Candyce GORMAN Reek, MD;  Location: [facility] CATH LAB;  Service: Cardiovascular;  Laterality: N/A;   OVARY SURGERY     ovarian cancer   POLYPECTOMY  03/09/2019   Procedure: POLYPECTOMY;  Surgeon: Golda Claudis PENNER, MD;  [facility];  Service: Endoscopy;;  cecal    POLYPECTOMY N/A 09/26/2019   Procedure: DUODENAL POLYPECTOMY;  Surgeon: Golda Claudis PENNER, MD;  [facility];  Service: Endoscopy;  Laterality: N/A;   POLYPECTOMY  08/11/2022   Procedure: POLYPECTOMY;  Surgeon: Albertus Gordy HERO, MD;  Location: [facility] ENDOSCOPY;  Service: Gastroenterology;;   REVISION OF ARTERIOVENOUS GORETEX GRAFT N/A 02/24/2017   Procedure: REVISION OF ARTERIOVENOUS GORETEX GRAFT (RESECTION);  Surgeon: Jama Cordella MATSU, MD;  Location: [facility] ORS;  Service: Vascular;  Laterality: N/A;   REVISON OF ARTERIOVENOUS FISTULA Left 06/19/2020   Procedure: REVISION OF LEFT UPPER ARM AV GRAFT WITH INTERPOSITION JUMP GRAFT  USING GORE LIMB;  Surgeon: Gretta Lonni PARAS, MD;  Location: [facility] OR;  Service: Vascular;  Laterality: Left;   SHUNTOGRAM N/A 10/15/2013   Procedure: Fistulogram;  Surgeon: Gaile LELON New, MD;  Location: [facility] CATH LAB;  Service: Cardiovascular;  Laterality: N/A;   SUBMUCOSAL TATTOO INJECTION  11/01/2022   Procedure: SUBMUCOSAL TATTOO INJECTION;  Surgeon: Eartha Angelia Sieving, MD;  [facility];  Service: Gastroenterology;;   THROMBECTOMY / ARTERIOVENOUS GRAFT REVISION  2011   left upper arm   TUBAL LIGATION  1980's   UPPER EXTREMITY ANGIOGRAPHY Bilateral 12/06/2016   Procedure: Upper Extremity Angiography;  Surgeon: Jama Cordella  G, MD;  Location: [facility] INVASIVE CV LAB;  Service: Cardiovascular;  Laterality: Bilateral;   UPPER EXTREMITY INTERVENTION Left 06/06/2017   Procedure: UPPER EXTREMITY INTERVENTION;  Surgeon: Jama Cordella MATSU, MD;  Location: [facility] INVASIVE CV LAB;  Service: Cardiovascular;  Laterality: Left;   UPPER EXTREMITY VENOGRAPHY Left 10/04/2022   Procedure: UPPER EXTREMITY VENOGRAPHY;  Surgeon: Jama Cordella MATSU, MD;  Location: [facility] INVASIVE CV LAB;  Service: Cardiovascular;  Laterality: Left;    Social History:  reports that she has never smoked. She has never used smokeless tobacco. She reports that she does not drink alcohol  and does not use drugs.   Allergies  Allergen Reactions   Amlodipine  Swelling   Aspirin  Other (See Comments)    High Doses Mess up her stomach; makes my bowels have blood in them. Takes 81 mg EC Aspirin     Nitrofurantoin Hives   Ranexa  [Ranolazine ] Other (See Comments)    Myoclonus-hospitalized    Bactrim [Sulfamethoxazole-Trimethoprim] Rash   Iodinated Contrast Media Itching and Other (See Comments)   Iron  Itching and Other (See Comments)    they gave me iron  in dialysis; had to give me Benadryl  cause I had to have the iron  (05/02/2012)   Tylenol  [Acetaminophen ] Itching and Other (See Comments)    Makes her feet on fire  per pt - can tolerate per pt   Gabapentin  Other (See Comments)    Unknown reaction   Sucroferric Oxyhydroxide Other (See Comments)    Unknown   Levaquin  [Levofloxacin  In D5w] Rash   Levofloxacin  Rash and Other (See Comments)   Plavix  [Clopidogrel  Bisulfate] Rash   Protonix  [Pantoprazole  Sodium] Rash   Venofer [Ferric Oxide] Itching and Other (See Comments)    Patient reports using Benadryl  prior to doses as [facility]    Family History  Problem Relation Age of Onset   Heart disease Mother        Heart Disease before age 58   Hyperlipidemia Mother    Hypertension Mother    Diabetes Mother    Heart attack Mother    Heart disease Father        Heart Disease before age 21   Hyperlipidemia Father    Hypertension Father    Diabetes Father    Diabetes Sister    Hypertension Sister    Diabetes Brother    Hyperlipidemia Brother    Heart attack Brother    Hypertension Sister    Heart attack Brother    Colon cancer Child 90   Other Other        noncontributory for early CAD   Esophageal cancer Neg Hx    Liver disease Neg Hx    Kidney disease Neg Hx    Colon polyps Neg Hx      Prior to Admission medications   Medication Sig Start Date End Date Taking? Authorizing Provider  calcitRIOL  (ROCALTROL ) 0.25 MCG capsule Take 7 capsules (1.75 mcg total) by mouth every Monday, Wednesday, and Friday with hemodialysis. 12/21/22   Christobal Guadalajara, MD  cinacalcet  (SENSIPAR ) 30 MG tablet Take 1 tablet (30 mg total) by mouth every Monday, Wednesday, and Friday with hemodialysis. 12/19/22   Christobal Guadalajara, MD  cyanocobalamin  (VITAMIN B12) 1000 MCG/ML injection Inject 1,000 mcg into the muscle every 30 (thirty) days.    [provider]  cyclobenzaprine  (FLEXERIL ) 5 MG tablet Take 5 mg by mouth 3 (three) times daily as needed for muscle spasms.    [provider]  folic acid  (FOLVITE ) 1 MG tablet  Take 1 mg by mouth daily. 05/24/22   [provider]  gabapentin  (NEURONTIN ) 100 MG  capsule Take 1 capsule (100 mg total) by mouth every evening. 06/05/23 08/05/23  Ghimire, Kuber, MD  hydrALAZINE  (APRESOLINE ) 25 MG tablet Take 1 tablet (25 mg total) by mouth 2 (two) times daily. Hold for sbp <110 12/27/22   Christobal Guadalajara, MD  hydrOXYzine  (ATARAX ) 25 MG tablet Take 25 mg by mouth every 6 (six) hours as needed for itching. 06/06/22   [provider]  levETIRAcetam  (KEPPRA ) 500 MG tablet Take 1 tablet (500 mg total) by mouth daily. 11/15/22   Singh, Prashant K, MD  levothyroxine  (SYNTHROID ) 125 MCG tablet Take 1 tablet (125 mcg total) by mouth daily at 6 (six) AM. 08/09/23 09/08/23  Vernon Ranks, MD  multivitamin (RENA-VIT) TABS tablet Take 1 tablet by mouth daily.    [provider]  nitroGLYCERIN  (NITROSTAT ) 0.4 MG SL tablet Place 0.4 mg under the tongue every 5 (five) minutes as needed for chest pain.    [provider]  octreotide  (SANDOSTATIN  LAR) 20 MG injection Inject 20 mg into the muscle every 28 (twenty-eight) days. 11/15/22   Dennise Lavada POUR, MD  omeprazole  (PRILOSEC) 40 MG capsule Take 40 mg by mouth daily. 05/31/23   [provider]  ondansetron  (ZOFRAN -ODT) 4 MG disintegrating tablet Take 4 mg by mouth every 8 (eight) hours as needed for nausea or vomiting. 05/22/23   [provider]  rosuvastatin  (CRESTOR ) 10 MG tablet Take 1 tablet by mouth once daily 06/06/22   Strader, Laymon HERO, PA-C    Physical Exam: BP (!) 139/35   Pulse 97   Temp 99.3 F (37.4 C) (Oral)   Resp 16   Ht 5' 1 (1.549 m)   Wt 51 kg   SpO2 92%   BMI 21.24 kg/m   General: 83 y.o. year-old female well developed well nourished in no acute distress.  Alert and oriented x3. HEENT: NCAT, EOMI Neck: Supple, trachea medial Cardiovascular: Tachycardia.  Regular rate and rhythm with no rubs or gallops.  No thyromegaly or JVD noted.  No lower extremity edema. 2/4 pulses in all 4 extremities. Respiratory: Tachypnea. Coarse breath sounds with diffuse rhonchi.   Bilateral rales in lower lobes.   Abdomen: Soft, nontender nondistended with normal bowel sounds x4 quadrants. Muskuloskeletal: No cyanosis, clubbing or edema noted bilaterally Neuro: CN II-XII intact, strength 5/5 x 4, sensation, reflexes intact Skin: No ulcerative lesions noted or rashes Psychiatry: Mood is appropriate for condition and setting          Labs on Admission:  Basic Metabolic Panel: Recent Labs  Lab 08/19/23 2056  NA 138  K 3.5  CL 97*  CO2 29  GLUCOSE 185*  BUN 22  CREATININE 3.36*  CALCIUM  9.2   Liver Function Tests: No results for input(s): AST, ALT, ALKPHOS, BILITOT, PROT, ALBUMIN  in the last 168 hours. No results for input(s): LIPASE, AMYLASE in the last 168 hours. No results for input(s): AMMONIA in the last 168 hours. CBC: Recent Labs  Lab 08/19/23 2056  WBC 6.8  HGB 8.1*  HCT 25.3*  MCV 114.5*  PLT 230   Cardiac Enzymes: No results for input(s): CKTOTAL, CKMB, CKMBINDEX, TROPONINI in the last 168 hours.  BNP (last 3 results) Recent Labs    08/26/22 1410 10/23/22 1420 05/13/23 1500  BNP 1,590.0* 2,205.0* 3,351.0*    ProBNP (last 3 results) No results for input(s): PROBNP in the last 8760 hours.  CBG: No results  for input(s): GLUCAP in the last 168 hours.  Radiological Exams on Admission: CT Chest Wo Contrast Result Date: 08/19/2023 CLINICAL DATA:  Left-sided chest pain for 1 day, cough EXAM: CT CHEST WITHOUT CONTRAST TECHNIQUE: Multidetector CT imaging of the chest was performed following the standard protocol without IV contrast. RADIATION DOSE REDUCTION: This exam was performed according to the departmental dose-optimization program which includes automated exposure control, adjustment of the mA and/or kV according to patient size and/or use of iterative reconstruction technique. COMPARISON:  08/19/2023, 10/23/2022 FINDINGS: Cardiovascular: Unenhanced imaging of the heart demonstrates cardiomegaly without  pericardial effusion. Postsurgical changes from prior CABG. Normal caliber of the thoracic aorta. There is extensive atherosclerosis of the aorta and native coronary vessels. Left internal jugular dialysis catheter tip within the superior vena cava at the atriocaval junction. Mediastinum/Nodes: Mediastinal adenopathy, largest lymph node in the precarinal region measuring 15 mm. Thyroid , trachea, and esophagus are unremarkable. Lungs/Pleura: For there are small bilateral pleural effusions, volume estimated less than 500 cc each. There is patchy bibasilar airspace disease, greatest in the right middle and right lower lobes. Bibasilar bronchial wall thickening. No pneumothorax. Upper Abdomen: Bilateral renal atrophy consistent with end-stage renal disease. No acute upper abdominal findings. Musculoskeletal: No acute or destructive bony abnormalities. Severe right shoulder osteoarthritis. Reconstructed images demonstrate no additional findings. IMPRESSION: 1. Bibasilar airspace disease and bronchial wall thickening, consistent with multifocal bronchopneumonia. 2. Small free-flowing bilateral pleural effusions, right greater than left. 3. Mediastinal lymphadenopathy. 4. Cardiomegaly. 5.  Aortic Atherosclerosis (ICD10-I70.0). Electronically Signed   By: Ozell Daring M.D.   On: 08/19/2023 22:41   DG Chest Portable 1 View Result Date: 08/19/2023 CLINICAL DATA:  Left-sided chest pain, cough EXAM: PORTABLE CHEST 1 VIEW COMPARISON:  08/05/2023 FINDINGS: Single frontal view of the chest demonstrates left internal jugular dialysis catheter, tip overlying the right atrium. Postsurgical changes from CABG. The cardiac silhouette is unremarkable. There is persistent pulmonary vascular congestion. Patchy bibasilar airspace disease and small bilateral pleural effusions are noted, which could reflect edema or infection. No significant change since prior exam. No pneumothorax. No acute bony abnormalities. IMPRESSION: 1. Vascular  congestion, with persistent bibasilar airspace disease and small bilateral pleural effusions. Overall, findings are consistent with pulmonary edema, though underlying infection would be difficult to exclude. No significant change since prior exam. Electronically Signed   By: Ozell Daring M.D.   On: 08/19/2023 21:42    EKG: I independently viewed the EKG done and my findings are as followed: Sinus tachycardia at a rate of 106 bpm with QTc of 506 ms  Assessment/Plan Present on Admission:  Multifocal pneumonia  Acute respiratory failure with hypoxia (HCC)  Pulmonary edema  Elevated troponin  Hypertensive urgency  PAF (paroxysmal atrial fibrillation) (HCC)  Chronic combined systolic and diastolic CHF (congestive heart failure) (HCC)  Mixed hyperlipidemia  Acquired hypothyroidism  Principal Problem:   Multifocal pneumonia Active Problems:   PAF (paroxysmal atrial fibrillation) (HCC)   Diabetes mellitus type 2 in nonobese [facility])   Acquired hypothyroidism   Mixed hyperlipidemia   Chronic combined systolic and diastolic CHF (congestive heart failure) (HCC)   Hypertensive urgency   Elevated troponin   Acute respiratory failure with hypoxia (HCC)   Pulmonary edema   Sepsis (HCC)   Bilateral pleural effusion  Sepsis secondary to multifocal pneumonia CT chest was suggestive of multifocal bronchopneumonia Patient met sepsis criteria due to being tachycardic and tachypneic with CT evidence of pneumonia. Patient was started on ceftriaxone  and azithromycin , we shall continue same at  this time with plan to de-escalate/discontinue based on blood culture, sputum culture, urine Legionella, strep pneumo and procalcitonin Continue Tylenol  as needed Continue Mucinex , Robitussin, incentive spirometry, flutter valve   Acute respiratory failure with hypoxia Continue supplemental oxygen  to maintain O2 sat greater than 92% with plan to wean patient off this as tolerated.  Patient does not use  supplemental oxygen  at baseline  Lactic acidosis possibly secondary to multifactorial including hypoxia vs sepsis Lactic acid 1.8 > 2.0.  Continue supplemental oxygen  Continue to trend lactic acid  Bilateral pleural effusion/pulmonary edema ESRD on HD (MWF) Chest x-ray was suggestive of small bilateral pleural effusion and pulmonary edema Lasix  cannot be given due to patient not being able to form urine anymore.  She has ESRD on HD Last hemodialysis was Friday (2/7).  Nephrology will be consulted for possible hemodialysis in the morning  Elevated troponin possibly secondary to type II demand ischemia, rule out NSTEMI Troponin 89 > 129 > 169 > 224; patient denies chest pain This is possibly secondary to sepsis.  Cardiologist on-call (Dr. Paticia Blanch) was consulted and agreed this is possibly due to sepsis. Heparin  drip is not recommended unless it is certain the patient is having NSTEMI due to the patient's history of recurrent GI bleed Chest x-ray showed no acute changes Continue to trend troponin   Prolonged QT interval QTc 506 ms  Avoid QT prolonging drugs Magnesium  level will be checked Repeat EKG in the morning  Hypertensive urgency-resolved Essential hypertension Continue hydralazine   Hypomagnesemia Magnesium  1.4, this will be replenished  Paroxysmal atrial fibrillation Rate controlled and stable Patient no longer on anticoagulation therapy due to history of anemia and previous GI bleed.   Chronic combined systolic and diastolic heart failure Continue total input/output, daily weights and fluid restriction Continue heart healthy/carb modified diet  Echocardiogram done in January 2023 showed ejection fraction 30 to 35%, G2 DD Continue volume management with hemodialysis treatments   Mixed hyperlipidemia Continue Crestor .   Type 2 diabetes with nephropathy Continue diet management  Acquired hypothyroidism Continue Synthroid .  DVT prophylaxis: Lovenox   Code  Status: Full code  Family Communication: Husband at bedside (all questions answered to satisfaction)  Consults: Nephrology  Severity of Illness: The appropriate patient status for this patient is INPATIENT. Inpatient status is judged to be reasonable and necessary in order to provide the required intensity of service to ensure the patient's safety. The patient's presenting symptoms, physical exam findings, and initial radiographic and laboratory data in the context of their chronic comorbidities is felt to place them at high risk for further clinical deterioration. Furthermore, it is not anticipated that the patient will be medically stable for discharge from the hospital within 2 midnights of admission.   * I certify that at the point of admission it is my clinical judgment that the patient will require inpatient hospital care spanning beyond 2 midnights from the point of admission due to high intensity of service, high risk for further deterioration and high frequency of surveillance required.*  Author: Elbia Paro, DO 08/20/2023 1:54 AM  For on call review [email or web address].

## 2023-08-19 NOTE — ED Provider Notes (Signed)
 [place] EMERGENCY DEPARTMENT AT [facility] Provider Note  CSN: [id number] Arrival date & time: 08/19/23 2047  Chief Complaint(s) Chest Pain, Cough, and Weakness  HPI Shawna [name] is a 83 y.o. female with past medical history as below, significant for CAD, status post CABG 2013, HFrEF, ESRD on HD Monday Wednesday Friday, HLD, HTN who presents to the ED with complaint of cough, dyspnea  Unwell for the past 2 to 3 days, productive cough with unknown colored sputum.  Exertional dyspnea.  Body aches, subjective fevers, having some chills.  Unknown sick contacts. No medication PTA. Difficulty breathing worsened today.  Last dialysis was on Friday, denies any missed sessions.  She reports that she does not make any urine  Past Medical History Past Medical History:  Diagnosis Date   Acute on chronic respiratory failure with hypoxia (HCC) 10/10/2016   Anxiety    Arthritis    AVM (arteriovenous malformation) of colon    CAD (coronary artery disease)    a. s/p CABG in 2013 b. DES to D1 in 10/2016. c. cath in 07/2018 showing patent grafts with occlusion of D1 at prior stent site and progression of PDA disease --> medical management recommended   Carotid artery disease (HCC)    a. 60-79% LICA, 03/2012    Chronic bronchitis (HCC)    Chronic HFrEF (heart failure with reduced ejection fraction) (HCC)    Colon cancer (HCC) 1992   Esophageal stricture    ESRD on hemodialysis (HCC)    ESRD due to HTN, started dialysis 2011 and gets HD at [facility] with Dr Edwardo on MWF schedule.  Access is LUA AVF as of Sept 2014.    GERD (gastroesophageal reflux disease)    High cholesterol 12/2011   History of blood transfusion 07/2011; 12/2011; 01/2012 X 2; 04/2012   History of gout    History of lower GI bleeding    Hypertension    Iron  deficiency anemia    Jugular vein occlusion, right (HCC)    Mitral regurgitation    a. Moderate by echo, 02/2012   Mitral valve disease    NSVT (nonsustained  ventricular tachycardia) (HCC)    Ovarian cancer (HCC) 1992   PAF (paroxysmal atrial fibrillation) (HCC)    Pneumonia ~ 2009   PUD (peptic ulcer disease)    TIA (transient ischemic attack)    Tricuspid valve disease    Patient Active Problem List   Diagnosis Date Noted   Multifocal pneumonia 08/19/2023   Bradycardia 08/05/2023   Generalized weakness 07/12/2023   Sinus bradycardia 07/11/2023   FTT (failure to thrive) in adult 06/04/2023   Symptomatic anemia 03/08/2023   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   Acute blood loss anemia 01/27/2023   Paroxysmal atrial flutter (HCC) 12/14/2022   Pressure injury of skin 12/01/2022   Rectal bleeding 11/30/2022   Generalized abdominal pain 11/10/2022   Ileus (HCC) 11/04/2022   Periumbilical abdominal pain 11/04/2022   Macrocytic anemia 10/29/2022   Anemia of chronic disease 10/29/2022   Upper GI bleed 10/29/2022   Iron  deficiency anemia due to chronic blood loss 08/26/2022   Benign neoplasm of transverse colon 08/11/2022   Benign neoplasm of descending colon 08/11/2022   Benign neoplasm of sigmoid colon 08/11/2022   Angiodysplasia of duodenum 08/11/2022   Delirium 04/22/2022   History of arteriovenous malformation (AVM) 04/22/2022   Acute metabolic encephalopathy 11/09/2021   NSTEMI, initial episode of care (HCC) 10/19/2021   Acute on chronic blood loss anemia 09/09/2021  Ischemic cardiomyopathy    Atrial fibrillation, chronic (HCC) 05/07/2021   Neuropathy 04/23/2021   Anemia associated with chronic renal failure 04/13/2021   Left knee pain 03/15/2021   Moderate protein-calorie malnutrition (HCC) 02/27/2021   Hypokalemia 02/27/2021   COVID-19 virus infection 02/03/2021   Leukocytosis 02/03/2021   Acquired hypothyroidism 02/03/2021   Myositis 12/03/2020   Ataxia 12/02/2020   Diabetes mellitus type 2 in nonobese (HCC) 11/10/2020   Failure to thrive in adult 11/09/2020   Dialysis AV fistula malfunction, initial  encounter (HCC)    Jugular vein occlusion, right (HCC)    Failure of surgically constructed arteriovenous fistula (HCC) 10/03/2020   Myoclonus 08/31/2020   Clotted renal dialysis AV graft, initial encounter (HCC)    Hemodialysis-associated hypotension    Irritable bowel syndrome 02/25/2020   Adenomatous duodenal polyp 09/10/2019   History of GI bleed 09/10/2019   Hand steal syndrome (HCC) 08/01/2017   CAD (coronary artery disease) 06/05/2017   Intestinal ischemia (HCC)    Complication of vascular access for dialysis 03/19/2017   Preoperative clearance 01/25/2017   H/O non-ST elevation myocardial infarction (NSTEMI) 10/24/2016   Non-ST elevation (NSTEMI) myocardial infarction [facility])    Heme positive stool    Cardiac arrest [facility])    Palliative care encounter    Goals of care, counseling/discussion    Flash pulmonary edema (HCC) 04/06/2016   History of colon cancer 01/27/2016   History of ovarian cancer 01/27/2016   PAF (paroxysmal atrial fibrillation) (HCC) 10/14/2015   Malignant neoplasm of right ovary (HCC) 10/14/2015   Wide-complex tachycardia 09/08/2015   SVT (supraventricular tachycardia) (HCC) 09/08/2015   Essential hypertension    Dyspnea    Constipation 03/12/2013   Abdominal wall pain in left flank 03/12/2013   Occlusion and stenosis of carotid artery without mention of cerebral infarction 01/24/2013   Hx of CABG 07/05/2012   Carotid artery disease (HCC) 07/05/2012   Mitral regurgitation 06/12/2012   Non-STEMI (non-ST elevated myocardial infarction) (HCC) 06/08/2012   Chronic combined systolic and diastolic CHF (congestive heart failure) (HCC) 05/02/2012   AVM (arteriovenous malformation) of small bowel, acquired with hemorrhage 01/20/2012   GERD (gastroesophageal reflux disease) 01/09/2012   Mixed hyperlipidemia 01/05/2012   Atherosclerotic heart disease of native coronary artery without angina pectoris 12/16/2011   ESRD on hemodialysis (HCC) 12/16/2011   Anxiety  disorder 05/04/2011   End-stage renal disease on hemodialysis (HCC) 04/29/2011   Gout 04/29/2011   Home Medication(s) Prior to Admission medications   Medication Sig Start Date End Date Taking? Authorizing Provider  calcitRIOL  (ROCALTROL ) 0.25 MCG capsule Take 7 capsules (1.75 mcg total) by mouth every Monday, Wednesday, and Friday with hemodialysis. 12/21/22   Christobal Guadalajara, MD  cinacalcet  (SENSIPAR ) 30 MG tablet Take 1 tablet (30 mg total) by mouth every Monday, Wednesday, and Friday with hemodialysis. 12/19/22   Christobal Guadalajara, MD  cyanocobalamin  (VITAMIN B12) 1000 MCG/ML injection Inject 1,000 mcg into the muscle every 30 (thirty) days.    [provider]  cyclobenzaprine  (FLEXERIL ) 5 MG tablet Take 5 mg by mouth 3 (three) times daily as needed for muscle spasms.    [provider]  folic acid  (FOLVITE ) 1 MG tablet Take 1 mg by mouth daily. 05/24/22   [provider]  gabapentin  (NEURONTIN ) 100 MG capsule Take 1 capsule (100 mg total) by mouth every evening. 06/05/23 08/05/23  Ghimire, Kuber, MD  hydrALAZINE  (APRESOLINE ) 25 MG tablet Take 1 tablet (25 mg total) by mouth 2 (two) times daily. Hold for sbp <110  12/27/22   Christobal Guadalajara, MD  hydrOXYzine  (ATARAX ) 25 MG tablet Take 25 mg by mouth every 6 (six) hours as needed for itching. 06/06/22   [provider]  levETIRAcetam  (KEPPRA ) 500 MG tablet Take 1 tablet (500 mg total) by mouth daily. 11/15/22   Singh, Prashant K, MD  levothyroxine  (SYNTHROID ) 125 MCG tablet Take 1 tablet (125 mcg total) by mouth daily at 6 (six) AM. 08/09/23 09/08/23  Vernon Ranks, MD  multivitamin (RENA-VIT) TABS tablet Take 1 tablet by mouth daily.    [provider]  nitroGLYCERIN  (NITROSTAT ) 0.4 MG SL tablet Place 0.4 mg under the tongue every 5 (five) minutes as needed for chest pain.    [provider]  octreotide  (SANDOSTATIN  LAR) 20 MG injection Inject 20 mg into the muscle every 28 (twenty-eight) days. 11/15/22   Dennise Lavada POUR, MD  omeprazole  (PRILOSEC) 40 MG capsule Take 40 mg by mouth daily. 05/31/23   [provider]  ondansetron  (ZOFRAN -ODT) 4 MG disintegrating tablet Take 4 mg by mouth every 8 (eight) hours as needed for nausea or vomiting. 05/22/23   [provider]  rosuvastatin  (CRESTOR ) 10 MG tablet Take 1 tablet by mouth once daily 06/06/22   Strader, Laymon HERO, PA-C                                                                                                                                    Past Surgical History Past Surgical History:  Procedure Laterality Date   A/V FISTULAGRAM Left 10/04/2022   Procedure: A/V Fistulagram;  Surgeon: Jama Cordella MATSU, MD;  Location: [facility] INVASIVE CV LAB;  Service: Cardiovascular;  Laterality: Left;   A/V SHUNTOGRAM Left 03/19/2019   Procedure: A/V SHUNTOGRAM;  Surgeon: Jama Cordella MATSU, MD;  Location: [facility] INVASIVE CV LAB;  Service: Cardiovascular;  Laterality: Left;   ABDOMINAL HYSTERECTOMY  1992   APPENDECTOMY  06/1990   AV FISTULA PLACEMENT  07/2009   left upper arm   AV FISTULA PLACEMENT Right 09/06/2016   Procedure: RIGHT FOREARM ARTERIOVENOUS (AV) GRAFT;  Surgeon: Carlin FORBES Haddock, MD;  Location: [facility] OR;  Service: Vascular;  Laterality: Right;   AV FISTULA PLACEMENT N/A 02/24/2017   Procedure: INSERTION OF ARTERIOVENOUS (AV) GORE-TEX GRAFT ARM (BRACHIAL AXILLARY);  Surgeon: Jama Cordella MATSU, MD;  Location: [facility] ORS;  Service: Vascular;  Laterality: N/A;   AVGG REMOVAL Right 09/06/2016   Procedure: REMOVAL OF Right Arm ARTERIOVENOUS GORETEX GRAFT and Vein Patch angioplasty of brachial artery;  Surgeon: Lonni GORMAN Blade, MD;  Location: [facility] OR;  Service: Vascular;  Laterality: Right;   BIOPSY  09/26/2019   Procedure: BIOPSY;  Surgeon: Golda Claudis PENNER, MD;  [facility]: [facility];  Service: Endoscopy;;   BIOPSY  11/01/2022   Procedure: BIOPSY;  Surgeon: Eartha Angelia Sieving, MD;  [facility];  Service:  Gastroenterology;;   COLON RESECTION  1992   COLON SURGERY  COLONOSCOPY N/A 03/09/2019   Procedure: COLONOSCOPY;  Surgeon: Golda Claudis PENNER, MD;  [facility];  Service: Endoscopy;  Laterality: N/A;   COLONOSCOPY N/A 08/11/2022   Procedure: COLONOSCOPY;  Surgeon: Albertus Gordy HERO, MD;  Location: [facility] ENDOSCOPY;  Service: Gastroenterology;  Laterality: N/A;   COLONOSCOPY WITH PROPOFOL  N/A 06/08/2021   Procedure: COLONOSCOPY WITH PROPOFOL ;  Surgeon: Eartha Angelia Sieving, MD;  [facility];  Service: Gastroenterology;  Laterality: N/A;  9:05 /Patient is on dialysis Mon Wed Fri   CORONARY ANGIOPLASTY WITH STENT PLACEMENT  12/15/11   2   CORONARY ANGIOPLASTY WITH STENT PLACEMENT  y/2013   1; makes total of 3 (05/02/2012)   CORONARY ARTERY BYPASS GRAFT  06/13/2012   Procedure: CORONARY ARTERY BYPASS GRAFTING (CABG);  Surgeon: Dallas KATHEE Jude, MD;  Location[facility] OR;  Service: Open Heart Surgery;  Laterality: N/A;  cabg x four;  using left internal mammary artery, and left leg greater saphenous vein harvested endoscopically   CORONARY STENT INTERVENTION N/A 10/13/2016   Procedure: Coronary Stent Intervention;  Surgeon: Debby DELENA Sor, MD;  Location: [facility] INVASIVE CV LAB;  Service: Cardiovascular;  Laterality: N/A;   DIALYSIS/PERMA CATHETER REMOVAL N/A 04/18/2017   Procedure: DIALYSIS/PERMA CATHETER REMOVAL;  Surgeon: Jama Cordella MATSU, MD;  Location: [facility] INVASIVE CV LAB;  Service: Cardiovascular;  Laterality: N/A;   DILATION AND CURETTAGE OF UTERUS     ENTEROSCOPY N/A 06/08/2021   Procedure: PUSH ENTEROSCOPY;  Surgeon: Eartha Angelia Sieving, MD;  [facility];  Service: Gastroenterology;  Laterality: N/A;   ENTEROSCOPY N/A 11/01/2022   Procedure: ENTEROSCOPY;  Surgeon: Eartha Angelia Sieving, MD;  [facility];  Service: Gastroenterology;  Laterality: N/A;   ENTEROSCOPY N/A 12/01/2022   Procedure: ENTEROSCOPY;  Surgeon: Leigh Elspeth SQUIBB, MD;   Location: [facility] ENDOSCOPY;  Service: Gastroenterology;  Laterality: N/A;   ESOPHAGOGASTRODUODENOSCOPY  01/20/2012   Procedure: ESOPHAGOGASTRODUODENOSCOPY (EGD);  Surgeon: Gwendlyn ONEIDA Buddy, MD,FACG;  Location: [facility] ENDOSCOPY;  Service: Endoscopy;  Laterality: N/A;   ESOPHAGOGASTRODUODENOSCOPY N/A 03/26/2013   Procedure: ESOPHAGOGASTRODUODENOSCOPY (EGD);  Surgeon: Norleen LOISE Kiang, MD;  Location: [facility] ENDOSCOPY;  Service: Endoscopy;  Laterality: N/A;   ESOPHAGOGASTRODUODENOSCOPY N/A 04/30/2015   Procedure: ESOPHAGOGASTRODUODENOSCOPY (EGD);  Surgeon: Claudis PENNER Golda, MD;  [facility];  Service: Endoscopy;  Laterality: N/A;  1pm - moved to 10/20 @ 1:10   ESOPHAGOGASTRODUODENOSCOPY N/A 07/29/2016   Procedure: ESOPHAGOGASTRODUODENOSCOPY (EGD);  Surgeon: Elspeth Deward Leigh, MD;  Location: [facility] ENDOSCOPY;  Service: Gastroenterology;  Laterality: N/A;  enteroscopy   ESOPHAGOGASTRODUODENOSCOPY N/A 09/26/2019   Procedure: ESOPHAGOGASTRODUODENOSCOPY (EGD);  Surgeon: Golda Claudis PENNER, MD;  [facility];  Service: Endoscopy;  Laterality: N/A;  1250   ESOPHAGOGASTRODUODENOSCOPY N/A 08/11/2022   Procedure: ESOPHAGOGASTRODUODENOSCOPY (EGD);  Surgeon: Albertus Gordy HERO, MD;  Location: [facility] ENDOSCOPY;  Service: Gastroenterology;  Laterality: N/A;   ESOPHAGOGASTRODUODENOSCOPY (EGD) WITH PROPOFOL  N/A 02/05/2021   Procedure: ESOPHAGOGASTRODUODENOSCOPY (EGD) WITH PROPOFOL ;  Surgeon: Cindie Carlin POUR, DO;  [facility];  Service: Endoscopy;  Laterality: N/A;   ESOPHAGOGASTRODUODENOSCOPY (EGD) WITH PROPOFOL  N/A 08/27/2022   Procedure: ESOPHAGOGASTRODUODENOSCOPY (EGD) WITH PROPOFOL ;  Surgeon: Cindie Carlin POUR, DO;  [facility];  Service: Endoscopy;  Laterality: N/A;   GIVENS CAPSULE STUDY N/A 03/07/2019   Procedure: GIVENS CAPSULE STUDY;  Surgeon: Golda Claudis PENNER, MD;  [facility];  Service: Endoscopy;  Laterality: N/A;  7:30   GIVENS CAPSULE STUDY N/A 04/22/2021   Procedure: GIVENS  CAPSULE STUDY;  Surgeon: Golda Claudis PENNER, MD;  [facility];  Service: Endoscopy;  Laterality: N/A;  7:30   GIVENS CAPSULE STUDY N/A 08/27/2022   Procedure: GIVENS CAPSULE STUDY;  Surgeon: Cindie Carlin POUR, DO;  [facility];  Service: Endoscopy;  Laterality: N/A;   HOT HEMOSTASIS N/A 08/11/2022   Procedure: HOT HEMOSTASIS (ARGON PLASMA COAGULATION/BICAP);  Surgeon: Albertus Gordy HERO, MD;  Location: [facility] ENDOSCOPY;  Service: Gastroenterology;  Laterality: N/A;   HOT HEMOSTASIS N/A 12/01/2022   Procedure: HOT HEMOSTASIS (ARGON PLASMA COAGULATION/BICAP);  Surgeon: Leigh Elspeth SQUIBB, MD;  Location: [facility] ENDOSCOPY;  Service: Gastroenterology;  Laterality: N/A;   INSERTION OF DIALYSIS CATHETER N/A 10/05/2020   Procedure: ABORTED TUNNELED DIALYSIS CATHETER PLACEMENT RIGHT INTERNAL JUGULAR VEIN ;  Surgeon: Kallie Manuelita BROCKS, MD;  Location: [facility];  Service: General;  Laterality: N/A;   INTRAOPERATIVE TRANSESOPHAGEAL ECHOCARDIOGRAM  06/13/2012   Procedure: INTRAOPERATIVE TRANSESOPHAGEAL ECHOCARDIOGRAM;  Surgeon: Dallas KATHEE Jude, MD;  Location: [facility] OR;  Service: Open Heart Surgery;  Laterality: N/A;   IR DIALY SHUNT INTRO NEEDLE/INTRACATH INITIAL W/IMG LEFT Left 10/06/2020   IR FLUORO GUIDE CV LINE RIGHT  06/17/2020   IR FLUORO GUIDE CV LINE RIGHT  11/12/2022   IR GENERIC HISTORICAL  07/26/2016   IR FLUORO GUIDE CV LINE RIGHT 07/26/2016 Ozell Specking, MD [facility]   IR GENERIC HISTORICAL  07/26/2016   IR US  GUIDE VASC ACCESS RIGHT 07/26/2016 Ozell Specking, MD [facility]   IR GENERIC HISTORICAL  08/02/2016   IR US  GUIDE VASC ACCESS RIGHT 08/02/2016 Ozell Specking, MD [facility]   IR GENERIC HISTORICAL  08/02/2016   IR FLUORO GUIDE CV LINE RIGHT 08/02/2016 Ozell Specking, MD [facility]   IR RADIOLOGY PERIPHERAL GUIDED IV START  03/28/2017   IR REMOVAL TUN CV CATH W/O FL  08/11/2020   IR REMOVAL TUN CV CATH W/O FL  11/15/2022   IR THROMBECTOMY AV FISTULA W/THROMBOLYSIS INC/SHUNT/IMG LEFT Left  06/17/2020   IR US  GUIDE VASC ACCESS LEFT  06/17/2020   IR US  GUIDE VASC ACCESS RIGHT  03/28/2017   IR US  GUIDE VASC ACCESS RIGHT  06/17/2020   IR US  GUIDE VASC ACCESS RIGHT  11/12/2022   LEFT HEART CATH AND CORONARY ANGIOGRAPHY N/A 09/20/2016   Procedure: Left Heart Cath and Coronary Angiography;  Surgeon: Victory LELON Sharps, MD;  Location: [facility] INVASIVE CV LAB;  Service: Cardiovascular;  Laterality: N/A;   LEFT HEART CATH AND CORS/GRAFTS ANGIOGRAPHY N/A 10/13/2016   Procedure: Left Heart Cath and Cors/Grafts Angiography;  Surgeon: Debby DELENA Sor, MD;  Location: [facility] CV LAB;  Service: Cardiovascular;  Laterality: N/A;   LEFT HEART CATH AND CORS/GRAFTS ANGIOGRAPHY N/A 07/13/2018   Procedure: LEFT HEART CATH AND CORS/GRAFTS ANGIOGRAPHY;  Surgeon: Jordan, Peter M, MD;  Location: [facility] INVASIVE CV LAB;  Service: Cardiovascular;  Laterality: N/A;   LEFT HEART CATH AND CORS/GRAFTS ANGIOGRAPHY N/A 07/22/2021   Procedure: LEFT HEART CATH AND CORS/GRAFTS ANGIOGRAPHY;  Surgeon: Court Dorn PARAS, MD;  Location: [facility] INVASIVE CV LAB;  Service: Cardiovascular;  Laterality: N/A;   LEFT HEART CATHETERIZATION WITH CORONARY ANGIOGRAM N/A 12/15/2011   Procedure: LEFT HEART CATHETERIZATION WITH CORONARY ANGIOGRAM;  Surgeon: Lonni JONETTA Cash, MD;  Location: [facility] CATH LAB;  Service: Cardiovascular;  Laterality: N/A;   LEFT HEART CATHETERIZATION WITH CORONARY ANGIOGRAM N/A 01/10/2012   Procedure: LEFT HEART CATHETERIZATION WITH CORONARY ANGIOGRAM;  Surgeon: Peter M Jordan, MD;  Location: [facility] CATH LAB;  Service: Cardiovascular;  Laterality: N/A;   LEFT HEART  CATHETERIZATION WITH CORONARY ANGIOGRAM N/A 06/08/2012   Procedure: LEFT HEART CATHETERIZATION WITH CORONARY ANGIOGRAM;  Surgeon: Lonni JONETTA Cash, MD;  Location: [facility] CATH LAB;  Service: Cardiovascular;  Laterality: N/A;   LEFT HEART CATHETERIZATION WITH CORONARY/GRAFT ANGIOGRAM N/A 12/10/2013   Procedure: LEFT HEART CATHETERIZATION WITH EL BILE;  Surgeon:  Candyce GORMAN Reek, MD;  Location: [facility] CATH LAB;  Service: Cardiovascular;  Laterality: N/A;   OVARY SURGERY     ovarian cancer   POLYPECTOMY  03/09/2019   Procedure: POLYPECTOMY;  Surgeon: Golda Claudis PENNER, MD;  [facility];  Service: Endoscopy;;  cecal    POLYPECTOMY N/A 09/26/2019   Procedure: DUODENAL POLYPECTOMY;  Surgeon: Golda Claudis PENNER, MD;  [facility];  Service: Endoscopy;  Laterality: N/A;   POLYPECTOMY  08/11/2022   Procedure: POLYPECTOMY;  Surgeon: Albertus Gordy HERO, MD;  Location: [facility] ENDOSCOPY;  Service: Gastroenterology;;   REVISION OF ARTERIOVENOUS GORETEX GRAFT N/A 02/24/2017   Procedure: REVISION OF ARTERIOVENOUS GORETEX GRAFT (RESECTION);  Surgeon: Jama Cordella MATSU, MD;  Location: [facility] ORS;  Service: Vascular;  Laterality: N/A;   REVISON OF ARTERIOVENOUS FISTULA Left 06/19/2020   Procedure: REVISION OF LEFT UPPER ARM AV GRAFT WITH INTERPOSITION JUMP GRAFT USING GORE LIMB;  Surgeon: Gretta Lonni PARAS, MD;  Location: [facility] OR;  Service: Vascular;  Laterality: Left;   SHUNTOGRAM N/A 10/15/2013   Procedure: Fistulogram;  Surgeon: Gaile LELON New, MD;  Location: [facility] CATH LAB;  Service: Cardiovascular;  Laterality: N/A;   SUBMUCOSAL TATTOO INJECTION  11/01/2022   Procedure: SUBMUCOSAL TATTOO INJECTION;  Surgeon: Eartha Angelia Sieving, MD;  [facility];  Service: Gastroenterology;;   THROMBECTOMY / ARTERIOVENOUS GRAFT REVISION  2011   left upper arm   TUBAL LIGATION  1980's   UPPER EXTREMITY ANGIOGRAPHY Bilateral 12/06/2016   Procedure: Upper Extremity Angiography;  Surgeon: Jama Cordella MATSU, MD;  Location: [facility] INVASIVE CV LAB;  Service: Cardiovascular;  Laterality: Bilateral;   UPPER EXTREMITY INTERVENTION Left 06/06/2017   Procedure: UPPER EXTREMITY INTERVENTION;  Surgeon: Jama Cordella MATSU, MD;  Location: [facility] INVASIVE CV LAB;  Service: Cardiovascular;  Laterality: Left;   UPPER EXTREMITY VENOGRAPHY Left 10/04/2022   Procedure: UPPER  EXTREMITY VENOGRAPHY;  Surgeon: Jama Cordella MATSU, MD;  Location: [facility] INVASIVE CV LAB;  Service: Cardiovascular;  Laterality: Left;   Family History Family History  Problem Relation Age of Onset   Heart disease Mother        Heart Disease before age 94   Hyperlipidemia Mother    Hypertension Mother    Diabetes Mother    Heart attack Mother    Heart disease Father        Heart Disease before age 61   Hyperlipidemia Father    Hypertension Father    Diabetes Father    Diabetes Sister    Hypertension Sister    Diabetes Brother    Hyperlipidemia Brother    Heart attack Brother    Hypertension Sister    Heart attack Brother    Colon cancer Child 66   Other Other        noncontributory for early CAD   Esophageal cancer Neg Hx    Liver disease Neg Hx    Kidney disease Neg Hx    Colon polyps Neg Hx     Social History Social History   Tobacco Use   Smoking status: Never   Smokeless tobacco: Never  Vaping Use   Vaping status: Never Used  Substance Use Topics  Alcohol  use: No    Alcohol /week: 0.0 standard drinks of alcohol    Drug use: No   Allergies Amlodipine , Aspirin , Nitrofurantoin, Ranexa  [ranolazine ], Bactrim [sulfamethoxazole-trimethoprim], Iodinated contrast media, Iron , Tylenol  [acetaminophen ], Gabapentin , Sucroferric oxyhydroxide, Levaquin  [levofloxacin  in d5w], Levofloxacin , Plavix  [clopidogrel  bisulfate], Protonix  [pantoprazole  sodium], and Venofer [ferric oxide]  Review of Systems Review of Systems  Constitutional:  Positive for chills and fever.  HENT:  Positive for congestion and postnasal drip.   Respiratory:  Positive for cough and shortness of breath.   Cardiovascular:  Positive for chest pain. Negative for palpitations.  Gastrointestinal:  Negative for abdominal pain and vomiting.  Musculoskeletal:  Positive for arthralgias.  Neurological:  Negative for syncope.  All other systems reviewed and are negative.   Physical Exam Vital Signs  I have  reviewed the triage vital signs BP (!) 145/51   Pulse (!) 101   Temp 98.6 F (37 C) (Oral)   Resp (!) 26   Ht 5' (1.524 m)   Wt 51 kg   SpO2 99%   BMI 21.96 kg/m  Physical Exam Vitals and nursing note reviewed.  Constitutional:      General: She is not in acute distress.    Appearance: Normal appearance. She is well-developed.  HENT:     Head: Normocephalic and atraumatic.     Right Ear: External ear normal.     Left Ear: External ear normal.     Nose: Nose normal.     Mouth/Throat:     Mouth: Mucous membranes are moist.  Eyes:     General: No scleral icterus.       Right eye: No discharge.        Left eye: No discharge.  Cardiovascular:     Rate and Rhythm: Regular rhythm. Tachycardia present.     Pulses: Normal pulses.     Heart sounds: Normal heart sounds.  Pulmonary:     Effort: Tachypnea and accessory muscle usage present. No respiratory distress.     Breath sounds: No stridor. No wheezing.     Comments: Breath sounds are coarse bilateral Rhonchi noted Chest:    Abdominal:     General: Abdomen is flat. There is no distension.     Palpations: Abdomen is soft.     Tenderness: There is no abdominal tenderness.  Musculoskeletal:     Cervical back: No rigidity.     Right lower leg: No edema.     Left lower leg: No edema.  Skin:    General: Skin is warm and dry.     Capillary Refill: Capillary refill takes less than 2 seconds.  Neurological:     Mental Status: She is alert and oriented to person, place, and time.  Psychiatric:        Mood and Affect: Mood normal.        Behavior: Behavior normal. Behavior is cooperative.     ED Results and Treatments Labs (all labs ordered are listed, but only abnormal results are displayed) Labs Reviewed  BASIC METABOLIC PANEL - Abnormal; Notable for the following components:      Result Value   Chloride 97 (*)    Glucose, Bld 185 (*)    Creatinine, Ser 3.36 (*)    GFR, Estimated 13 (*)    All other components  within normal limits  CBC - Abnormal; Notable for the following components:   RBC 2.21 (*)    Hemoglobin 8.1 (*)    HCT 25.3 (*)    MCV 114.5 (*)  MCH 36.7 (*)    RDW 23.7 (*)    All other components within normal limits  LACTIC ACID, PLASMA - Abnormal; Notable for the following components:   Lactic Acid, Venous 2.0 (*)    All other components within normal limits  TROPONIN I (HIGH SENSITIVITY) - Abnormal; Notable for the following components:   Troponin I (High Sensitivity) 89 (*)    All other components within normal limits  RESP PANEL BY RT-PCR (RSV, FLU A&B, COVID)  RVPGX2  CULTURE, BLOOD (ROUTINE X 2)  CULTURE, BLOOD (ROUTINE X 2)  EXPECTORATED SPUTUM ASSESSMENT W GRAM STAIN, RFLX TO RESP C  LACTIC ACID, PLASMA  PROTIME-INR  TROPONIN I (HIGH SENSITIVITY)                                                                                                                          Radiology CT Chest Wo Contrast Result Date: 08/19/2023 CLINICAL DATA:  Left-sided chest pain for 1 day, cough EXAM: CT CHEST WITHOUT CONTRAST TECHNIQUE: Multidetector CT imaging of the chest was performed following the standard protocol without IV contrast. RADIATION DOSE REDUCTION: This exam was performed according to the departmental dose-optimization program which includes automated exposure control, adjustment of the mA and/or kV according to patient size and/or use of iterative reconstruction technique. COMPARISON:  08/19/2023, 10/23/2022 FINDINGS: Cardiovascular: Unenhanced imaging of the heart demonstrates cardiomegaly without pericardial effusion. Postsurgical changes from prior CABG. Normal caliber of the thoracic aorta. There is extensive atherosclerosis of the aorta and native coronary vessels. Left internal jugular dialysis catheter tip within the superior vena cava at the atriocaval junction. Mediastinum/Nodes: Mediastinal adenopathy, largest lymph node in the precarinal region measuring 15 mm. Thyroid ,  trachea, and esophagus are unremarkable. Lungs/Pleura: For there are small bilateral pleural effusions, volume estimated less than 500 cc each. There is patchy bibasilar airspace disease, greatest in the right middle and right lower lobes. Bibasilar bronchial wall thickening. No pneumothorax. Upper Abdomen: Bilateral renal atrophy consistent with end-stage renal disease. No acute upper abdominal findings. Musculoskeletal: No acute or destructive bony abnormalities. Severe right shoulder osteoarthritis. Reconstructed images demonstrate no additional findings. IMPRESSION: 1. Bibasilar airspace disease and bronchial wall thickening, consistent with multifocal bronchopneumonia. 2. Small free-flowing bilateral pleural effusions, right greater than left. 3. Mediastinal lymphadenopathy. 4. Cardiomegaly. 5.  Aortic Atherosclerosis (ICD10-I70.0). Electronically Signed   By: Ozell Daring M.D.   On: 08/19/2023 22:41   DG Chest Portable 1 View Result Date: 08/19/2023 CLINICAL DATA:  Left-sided chest pain, cough EXAM: PORTABLE CHEST 1 VIEW COMPARISON:  08/05/2023 FINDINGS: Single frontal view of the chest demonstrates left internal jugular dialysis catheter, tip overlying the right atrium. Postsurgical changes from CABG. The cardiac silhouette is unremarkable. There is persistent pulmonary vascular congestion. Patchy bibasilar airspace disease and small bilateral pleural effusions are noted, which could reflect edema or infection. No significant change since prior exam. No pneumothorax. No acute bony abnormalities. IMPRESSION: 1. Vascular congestion, with persistent bibasilar airspace disease and small bilateral  pleural effusions. Overall, findings are consistent with pulmonary edema, though underlying infection would be difficult to exclude. No significant change since prior exam. Electronically Signed   By: Ozell Daring M.D.   On: 08/19/2023 21:42    Pertinent labs & imaging results that were available during my care  of the patient were reviewed by me and considered in my medical decision making (see MDM for details).  Medications Ordered in ED Medications  cefTRIAXone  (ROCEPHIN ) 1 g in sodium chloride  0.9 % 100 mL IVPB (1 g Intravenous New Bag/Given 08/19/23 2313)  azithromycin  (ZITHROMAX ) 500 mg in sodium chloride  0.9 % 250 mL IVPB (has no administration in time range)  ipratropium-albuterol  (DUONEB) 0.5-2.5 (3) MG/3ML nebulizer solution 3 mL (3 mLs Nebulization Given 08/19/23 2243)  hydrALAZINE  (APRESOLINE ) injection 10 mg (10 mg Intravenous Given 08/19/23 2240)                                                                                                                                     Procedures .Critical Care  Performed by: Elnor Jayson LABOR, DO Authorized by: Elnor Jayson LABOR, DO   Critical care provider statement:    Critical care time (minutes):  50   Critical care time was exclusive of:  Separately billable procedures and treating other patients   Critical care was necessary to treat or prevent imminent or life-threatening deterioration of the following conditions:  Respiratory failure   Critical care was time spent personally by me on the following activities:  Development of treatment plan with patient or surrogate, discussions with consultants, evaluation of patient's response to treatment, examination of patient, ordering and review of laboratory studies, ordering and review of radiographic studies, ordering and performing treatments and interventions, pulse oximetry, re-evaluation of patient's condition, review of old charts and obtaining history from patient or surrogate   Care discussed with: admitting provider     (including critical care time)  Medical Decision Making / ED Course    Medical Decision Making:    EVALUNA UTKE is a 83 y.o. female with past medical history as below, significant for CAD, status post CABG 2013, HFrEF, ESRD on HD Monday Wednesday Friday, HLD, HTN who presents  to the ED with complaint of cough, dyspnea. The complaint involves an extensive differential diagnosis and also carries with it a high risk of complications and morbidity.  Serious etiology was considered. Ddx includes but is not limited to: In my evaluation of this patient's dyspnea my DDx includes, but is not limited to, pneumonia, pulmonary embolism, pneumothorax, pulmonary edema, metabolic acidosis, asthma, COPD, cardiac cause, anemia, anxiety, etc.    Complete initial physical exam performed, notably the patient was in mild respiratory distress, pulse ox 8788% on room air, transition to 2 L nasal cannula improvement..    Reviewed and confirmed nursing documentation for past medical history, family history, social history.  Vital signs reviewed.     Clinical  Course as of 08/19/23 2335  Sat Aug 19, 2023  2214 Hypoxic on arrival 88%, improved to 97% w/ 2LNC, does not use home oxygen  [SG]  2306 Troponin I (High Sensitivity)(!): 89 She has ESRD, hypoxia.  No current chest pain favor demand ischemia.  Will trend [SG]  2308 BP(!): 185/71 BP improved after hydralazine   [SG]  2309 Hemoglobin(!): 8.1 Similar to prior [SG]    Clinical Course User Index [SG] Elnor Jayson LABOR, DO    Brief summary: 83 year old female history of ESRD on HD, CAD, CABG as above here with cough, difficulty breathing.  Hypoxic on arrival.  Proved with 2 L nasal cannula.  Coarse breath sounds bilateral.  Blood pressure elevated, do not take her evening and hypertensive, give hydralazine  here.  Labs reviewed, troponin is moderate elevated, EKG with left bundle branch block which is known.  Creatinine similar compared to her baseline, potassium is 3.5, bicarb is normal.  CBC stable, hemoglobin 8.1, similar to her baseline  RVP was negative.  Chest x-ray with possible pneumonia, CT chest with multifocal bronchopneumonia.  RVP neg. Cover with Rocephin  azithromycin , send sputum.  Plan admission for pneumonia, hypoxia. Admit  TRH     She was admitted 1/25 with symptomatic bradycardia.  Improved after washout of Amio and metoprolol .  She was also admitted 12/30 with weakness and anemia.             Additional history obtained: -Additional history obtained from family -External records from outside source obtained and reviewed including: Chart review including previous notes, labs, imaging, consultation notes including  Prior admit, home medications, prior labs and imaging   Lab Tests: -I ordered, reviewed, and interpreted labs.   The pertinent results include:   Labs Reviewed  BASIC METABOLIC PANEL - Abnormal; Notable for the following components:      Result Value   Chloride 97 (*)    Glucose, Bld 185 (*)    Creatinine, Ser 3.36 (*)    GFR, Estimated 13 (*)    All other components within normal limits  CBC - Abnormal; Notable for the following components:   RBC 2.21 (*)    Hemoglobin 8.1 (*)    HCT 25.3 (*)    MCV 114.5 (*)    MCH 36.7 (*)    RDW 23.7 (*)    All other components within normal limits  LACTIC ACID, PLASMA - Abnormal; Notable for the following components:   Lactic Acid, Venous 2.0 (*)    All other components within normal limits  TROPONIN I (HIGH SENSITIVITY) - Abnormal; Notable for the following components:   Troponin I (High Sensitivity) 89 (*)    All other components within normal limits  RESP PANEL BY RT-PCR (RSV, FLU A&B, COVID)  RVPGX2  CULTURE, BLOOD (ROUTINE X 2)  CULTURE, BLOOD (ROUTINE X 2)  EXPECTORATED SPUTUM ASSESSMENT W GRAM STAIN, RFLX TO RESP C  LACTIC ACID, PLASMA  PROTIME-INR  TROPONIN I (HIGH SENSITIVITY)    Notable for as above  EKG   EKG Interpretation Date/Time:  Saturday August 19 2023 20:58:18 EST Ventricular Rate:  106 PR Interval:  157 QRS Duration:  145 QT Interval:  381 QTC Calculation: 506 R Axis:   43  Text Interpretation: Sinus tachycardia IVCD, consider atypical LBBB Confirmed by Elnor Jayson ([id number]) on 08/19/2023 11:01:28  PM         Imaging Studies ordered: I ordered imaging studies including CT chest, chest x-ray I independently visualized the following imaging with scope of interpretation  limited to determining acute life threatening conditions related to emergency care; findings noted above I independently visualized and interpreted imaging. I agree with the radiologist interpretation   Medicines ordered and prescription drug management: Meds ordered this encounter  Medications   ipratropium-albuterol  (DUONEB) 0.5-2.5 (3) MG/3ML nebulizer solution 3 mL   hydrALAZINE  (APRESOLINE ) injection 10 mg   cefTRIAXone  (ROCEPHIN ) 1 g in sodium chloride  0.9 % 100 mL IVPB    Antibiotic Indication::   CAP   azithromycin  (ZITHROMAX ) 500 mg in sodium chloride  0.9 % 250 mL IVPB    Antibiotic Indication::   CAP    -I have reviewed the patients home medicines and have made adjustments as needed   Consultations Obtained: na   Cardiac Monitoring: The patient was maintained on a cardiac monitor.  I personally viewed and interpreted the cardiac monitored which showed an underlying rhythm of: LBBB Continuous pulse oximetry interpreted by myself, 98% on 2L, 88% on RA.    Social Determinants of Health:  Diagnosis or treatment significantly limited by social determinants of health: HD   Reevaluation: After the interventions noted above, I reevaluated the patient and found that they have improved  Co morbidities that complicate the patient evaluation  Past Medical History:  Diagnosis Date   Acute on chronic respiratory failure with hypoxia (HCC) 10/10/2016   Anxiety    Arthritis    AVM (arteriovenous malformation) of colon    CAD (coronary artery disease)    a. s/p CABG in 2013 b. DES to D1 in 10/2016. c. cath in 07/2018 showing patent grafts with occlusion of D1 at prior stent site and progression of PDA disease --> medical management recommended   Carotid artery disease (HCC)    a. 60-79% LICA, 03/2012     Chronic bronchitis (HCC)    Chronic HFrEF (heart failure with reduced ejection fraction) (HCC)    Colon cancer (HCC) 1992   Esophageal stricture    ESRD on hemodialysis (HCC)    ESRD due to HTN, started dialysis 2011 and gets HD at [facility] with Dr Edwardo on MWF schedule.  Access is LUA AVF as of Sept 2014.    GERD (gastroesophageal reflux disease)    High cholesterol 12/2011   History of blood transfusion 07/2011; 12/2011; 01/2012 X 2; 04/2012   History of gout    History of lower GI bleeding    Hypertension    Iron  deficiency anemia    Jugular vein occlusion, right (HCC)    Mitral regurgitation    a. Moderate by echo, 02/2012   Mitral valve disease    NSVT (nonsustained ventricular tachycardia) (HCC)    Ovarian cancer (HCC) 1992   PAF (paroxysmal atrial fibrillation) (HCC)    Pneumonia ~ 2009   PUD (peptic ulcer disease)    TIA (transient ischemic attack)    Tricuspid valve disease       Dispostion: Disposition decision including need for hospitalization was considered, and patient admitted to the hospital.    Final Clinical Impression(s) / ED Diagnoses Final diagnoses:  Bronchopneumonia  ESRD on hemodialysis (HCC)  Hypoxia  Poorly-controlled hypertension  Pleural effusion        Elnor Jayson LABOR, DO 08/19/23 2335

## 2023-08-19 NOTE — ED Triage Notes (Signed)
 Pt with c/o L sided chest pain that started today. Pt also c/o cough that started today and overall "not feeling well".

## 2023-08-20 DIAGNOSIS — J189 Pneumonia, unspecified organism: Secondary | ICD-10-CM | POA: Diagnosis not present

## 2023-08-20 DIAGNOSIS — R6520 Severe sepsis without septic shock: Secondary | ICD-10-CM | POA: Insufficient documentation

## 2023-08-20 DIAGNOSIS — A419 Sepsis, unspecified organism: Secondary | ICD-10-CM | POA: Insufficient documentation

## 2023-08-20 DIAGNOSIS — J90 Pleural effusion, not elsewhere classified: Secondary | ICD-10-CM | POA: Insufficient documentation

## 2023-08-20 LAB — CBC
HCT: 22.3 % — ABNORMAL LOW (ref 36.0–46.0)
Hemoglobin: 7.1 g/dL — ABNORMAL LOW (ref 12.0–15.0)
MCH: 37 pg — ABNORMAL HIGH (ref 26.0–34.0)
MCHC: 31.8 g/dL (ref 30.0–36.0)
MCV: 116.1 fL — ABNORMAL HIGH (ref 80.0–100.0)
Platelets: 193 10*3/uL (ref 150–400)
RBC: 1.92 MIL/uL — ABNORMAL LOW (ref 3.87–5.11)
RDW: 23.8 % — ABNORMAL HIGH (ref 11.5–15.5)
WBC: 7 10*3/uL (ref 4.0–10.5)
nRBC: 0 % (ref 0.0–0.2)

## 2023-08-20 LAB — COMPREHENSIVE METABOLIC PANEL
ALT: 19 U/L (ref 0–44)
AST: 34 U/L (ref 15–41)
Albumin: 2.9 g/dL — ABNORMAL LOW (ref 3.5–5.0)
Alkaline Phosphatase: 140 U/L — ABNORMAL HIGH (ref 38–126)
Anion gap: 14 (ref 5–15)
BUN: 26 mg/dL — ABNORMAL HIGH (ref 8–23)
CO2: 25 mmol/L (ref 22–32)
Calcium: 8.9 mg/dL (ref 8.9–10.3)
Chloride: 97 mmol/L — ABNORMAL LOW (ref 98–111)
Creatinine, Ser: 3.78 mg/dL — ABNORMAL HIGH (ref 0.44–1.00)
GFR, Estimated: 11 mL/min — ABNORMAL LOW (ref >60)
Glucose, Bld: 163 mg/dL — ABNORMAL HIGH (ref 70–99)
Potassium: 3.4 mmol/L — ABNORMAL LOW (ref 3.5–5.1)
Sodium: 136 mmol/L (ref 135–145)
Total Bilirubin: 1 mg/dL (ref 0.0–1.2)
Total Protein: 6.6 g/dL (ref 6.5–8.1)

## 2023-08-20 LAB — PREPARE RBC (CROSSMATCH)

## 2023-08-20 LAB — LACTIC ACID, PLASMA
Lactic Acid, Venous: 2.2 mmol/L (ref 0.5–1.9)
Lactic Acid, Venous: 2.4 mmol/L (ref 0.5–1.9)

## 2023-08-20 LAB — TROPONIN I (HIGH SENSITIVITY)
Troponin I (High Sensitivity): 169 ng/L (ref <18)
Troponin I (High Sensitivity): 224 ng/L (ref <18)
Troponin I (High Sensitivity): 280 ng/L (ref <18)
Troponin I (High Sensitivity): 280 ng/L (ref <18)

## 2023-08-20 LAB — MRSA NEXT GEN BY PCR, NASAL: MRSA by PCR Next Gen: NOT DETECTED

## 2023-08-20 LAB — MAGNESIUM: Magnesium: 1.4 mg/dL — ABNORMAL LOW (ref 1.7–2.4)

## 2023-08-20 LAB — PROCALCITONIN: Procalcitonin: 0.44 ng/mL

## 2023-08-20 LAB — PHOSPHORUS: Phosphorus: 3.2 mg/dL (ref 2.5–4.6)

## 2023-08-20 MED ORDER — HYDRALAZINE HCL 25 MG PO TABS
25.0000 mg | ORAL_TABLET | Freq: Two times a day (BID) | ORAL | Status: DC
  Administered 2023-08-20 – 2023-08-24 (×7): 25 mg via ORAL
  Filled 2023-08-20 (×8): qty 1

## 2023-08-20 MED ORDER — SODIUM CHLORIDE 0.9 % IV SOLN
2.0000 g | INTRAVENOUS | Status: AC
  Administered 2023-08-20 – 2023-08-23 (×4): 2 g via INTRAVENOUS
  Filled 2023-08-20 (×4): qty 20

## 2023-08-20 MED ORDER — SODIUM CHLORIDE 0.9% FLUSH
10.0000 mL | Freq: Two times a day (BID) | INTRAVENOUS | Status: DC
Start: 2023-08-20 — End: 2023-08-24
  Administered 2023-08-20 – 2023-08-24 (×9): 10 mL

## 2023-08-20 MED ORDER — ALBUMIN HUMAN 25 % IV SOLN
INTRAVENOUS | Status: AC
  Filled 2023-08-20: qty 100

## 2023-08-20 MED ORDER — PROCHLORPERAZINE EDISYLATE 10 MG/2ML IJ SOLN
10.0000 mg | Freq: Four times a day (QID) | INTRAMUSCULAR | Status: DC | PRN
  Administered 2023-08-20: 10 mg via INTRAVENOUS
  Filled 2023-08-20: qty 2

## 2023-08-20 MED ORDER — MAGNESIUM SULFATE 2 GM/50ML IV SOLN
2.0000 g | Freq: Once | INTRAVENOUS | Status: AC
  Administered 2023-08-20: 2 g via INTRAVENOUS
  Filled 2023-08-20: qty 50

## 2023-08-20 MED ORDER — SODIUM CHLORIDE 0.9% FLUSH: 10.0000 mL | INTRAVENOUS | Status: DC | PRN

## 2023-08-20 MED ORDER — LEVOTHYROXINE SODIUM 75 MCG PO TABS
125.0000 ug | ORAL_TABLET | Freq: Every day | ORAL | Status: DC
  Administered 2023-08-20 – 2023-08-24 (×5): 125 ug via ORAL
  Filled 2023-08-20 (×5): qty 1

## 2023-08-20 MED ORDER — CINACALCET HCL 30 MG PO TABS
30.0000 mg | ORAL_TABLET | ORAL | Status: DC
  Administered 2023-08-21 – 2023-08-23 (×2): 30 mg via ORAL
  Filled 2023-08-20: qty 1

## 2023-08-20 MED ORDER — METOPROLOL TARTRATE 25 MG PO TABS
25.0000 mg | ORAL_TABLET | Freq: Two times a day (BID) | ORAL | Status: DC
  Administered 2023-08-20 – 2023-08-24 (×7): 25 mg via ORAL
  Filled 2023-08-20 (×9): qty 1

## 2023-08-20 MED ORDER — GUAIFENESIN-DM 100-10 MG/5ML PO SYRP
5.0000 mL | ORAL_SOLUTION | ORAL | Status: DC | PRN
  Administered 2023-08-23: 5 mL via ORAL
  Filled 2023-08-20: qty 5

## 2023-08-20 MED ORDER — ENOXAPARIN SODIUM 30 MG/0.3ML IJ SOSY
30.0000 mg | PREFILLED_SYRINGE | INTRAMUSCULAR | Status: DC
  Administered 2023-08-20 – 2023-08-24 (×5): 30 mg via SUBCUTANEOUS
  Filled 2023-08-20 (×5): qty 0.3

## 2023-08-20 MED ORDER — ROSUVASTATIN CALCIUM 20 MG PO TABS
10.0000 mg | ORAL_TABLET | Freq: Every day | ORAL | Status: DC
  Administered 2023-08-20 – 2023-08-24 (×5): 10 mg via ORAL
  Filled 2023-08-20 (×5): qty 1

## 2023-08-20 MED ORDER — CHLORHEXIDINE GLUCONATE CLOTH 2 % EX PADS
6.0000 | MEDICATED_PAD | Freq: Every day | CUTANEOUS | Status: DC
  Administered 2023-08-23 – 2023-08-24 (×2): 6 via TOPICAL

## 2023-08-20 MED ORDER — ALBUMIN HUMAN 25 % IV SOLN
25.0000 g | Freq: Once | INTRAVENOUS | Status: AC
  Administered 2023-08-20: 25 g via INTRAVENOUS

## 2023-08-20 MED ORDER — ACETAMINOPHEN 325 MG PO TABS
650.0000 mg | ORAL_TABLET | Freq: Four times a day (QID) | ORAL | Status: DC | PRN
Start: 2023-08-20 — End: 2023-08-24
  Administered 2023-08-20 – 2023-08-23 (×4): 650 mg via ORAL
  Filled 2023-08-20 (×4): qty 2

## 2023-08-20 MED ORDER — RENA-VITE PO TABS
1.0000 | ORAL_TABLET | Freq: Every day | ORAL | Status: DC
  Administered 2023-08-20 – 2023-08-24 (×5): 1 via ORAL
  Filled 2023-08-20 (×5): qty 1

## 2023-08-20 MED ORDER — SODIUM CHLORIDE 0.9 % IV SOLN
500.0000 mg | INTRAVENOUS | Status: DC
  Administered 2023-08-20: 500 mg via INTRAVENOUS
  Filled 2023-08-20: qty 5

## 2023-08-20 MED ORDER — SODIUM CHLORIDE 0.9% IV SOLUTION: Freq: Once | INTRAVENOUS | Status: DC

## 2023-08-20 MED ORDER — HEPARIN SODIUM (PORCINE) 1000 UNIT/ML DIALYSIS
1000.0000 [IU] | INTRAMUSCULAR | Status: DC | PRN
  Administered 2023-08-21 – 2023-08-23 (×2): 3800 [IU]

## 2023-08-20 MED ORDER — DM-GUAIFENESIN ER 30-600 MG PO TB12
1.0000 | ORAL_TABLET | Freq: Two times a day (BID) | ORAL | Status: DC
  Administered 2023-08-20 – 2023-08-24 (×8): 1 via ORAL
  Filled 2023-08-20 (×9): qty 1

## 2023-08-20 NOTE — Progress Notes (Signed)
 Patient HD tunnelled catheter dressing fallen off on admit to ICU, no securement in place,  however both lumen covered up with a  guaze dressing, new dressing done over tunnelled cath. No drainage or pulling of cath, clean and dry. Will continue to monitor

## 2023-08-20 NOTE — Progress Notes (Addendum)
   HEMODIALYSIS TREATMENT NOTE:   Call received at 1600 to dialyze this patient today.  Pre-HD 144/58, HR 70s, spO2 100% on 2L O2 via [place].  Was given Hydralazine  25mg  and Metoprolol  25mg  earlier today, as HD plan was not known at the time. Pt does not require O2 at baseline.  Desaturated to 87% while on room air while being transported from 213 to 228 ([facility]).   3.5 hour heparin -free tx was initiated using left internal jugular TDC.  Within 30 minutes, BP dropped to 78/53 and HR^ to 120s Afib (known hx), sustained.  UF was stopped, NS and Albumin  were given, and UF was resumed at a lower goal and lower Qb when BP improved.    BP could not tolerate sustained UF, even with cooling of dialysate and serial lowering of goal.  Next, Isolated UF was attempted, but she could only tolerate 15 minutes of this before becoming hypotensive again.  Dr. Geralynn was kept apprised of pt's response to treatment/interventions.  HD was stopped after inability to tolerate isolated UF.  Total run time: 2 hours 22 minutes Net UF: 800 ml.  Was transported back to 213 while on room air and maintained saturations > 94%.  Post-HD:  08/20/23 2145  Vitals  Temp 97.9 F (36.6 C)  Temp Source Oral  BP 125/67  MAP (mmHg) 82  BP Location Right Wrist  BP Method Automatic  Patient Position (if appropriate) Lying  Pulse Rate (!) 121  Pulse Rate Source Monitor  ECG Heart Rate (!) 121  Resp 19  Oxygen  Therapy  SpO2 100 %  O2 Device Nasal Cannula  O2 Flow Rate (L/min) 2 L/min  Post Treatment  Dialyzer Clearance Lightly streaked  Hemodialysis Intake (mL) 200 mL  Liters Processed 45.8  Fluid Removed (mL) 800 mL  Tolerated HD Treatment No (Comment)  Post-Hemodialysis Comments BP unable to tolerate UF.  Hemodialysis Catheter Double lumen Permanent (Tunneled)  No placement date or time found.   Placed prior to admission: Yes  Hemodialysis Catheter Type: Double lumen Permanent (Tunneled)  Blue Lumen Status  Flushed;Heparin  locked;Dead end cap in place  Red Lumen Status Flushed;Heparin  locked;Dead end cap in place  Purple Lumen Status N/A  Catheter fill solution Heparin  1000 units/ml  Catheter fill volume (Arterial) 1.9 cc  Catheter fill volume (Venous) 1.9  Dressing Type Transparent;Tube stabilization device  Dressing Status Antimicrobial disc/dressing in place;Clean, Dry, Intact  Drainage Description None  Dressing Change Due 08/27/23  Post treatment catheter status Capped and Clamped    Jon Laos, RN [facility]

## 2023-08-20 NOTE — Progress Notes (Signed)
 CRITICAL VALUE STICKER  CRITICAL VALUE: Lactic acid 2.2   RECEIVER (on-site recipient of call):Ellena Gurney, RN  MD NOTIFIED: Dr. Elyse Hand  TIME OF NOTIFICATION: 9485 AM  RESPONSE:  awaiting

## 2023-08-20 NOTE — Progress Notes (Signed)
 gned      CRITICAL VALUE STICKER   CRITICAL VALUE: Troponin 169   RECEIVER (on-site recipient of call):Ellena Gurney, RN   MD NOTIFIED: Dr. Elyse Hand   TIME OF NOTIFICATION: 0248 AM   RESPONSE:  12 leads EKG

## 2023-08-20 NOTE — Progress Notes (Addendum)
 PROGRESS NOTE    MARYSA WESSNER  FMW:[id number] DOB: [birth year] DOA: 08/19/2023 PCP: Practice, [facility]   Brief Narrative:  HPI: Shawna [name] is a 83 y.o. female with medical history significant of ESRD on hemodialysis (MWF), CAD status post CABG, HFrEF, T2DM, hypertension, GERD, atrial fibrillation (not on anticoagulants) who presents to the emergency department with complaint of cough which started today.  She complained of 2 to 3-day onset of not feeling well and complained of body aches, productive cough (unsure of sputum color), subjective fever.  Shortness of breath worsened today, so she presented to the ED for further evaluation and management.   ED Course: In the emergency department, she was tachypneic, tachycardic, BP was 178/80, O2 sat was 89% on room air, this improved to 100% on supplemental oxygen  via [employer] at 2 LPM.  Temperature 98.6 F.  Workup in the ED showed hemoglobin 8.1, hematocrit 25.3, MCV 114.5.  BMP was normal except for chloride 97, blood glucose 185, creatinine 3.36, GFR 13.  Lactic acid 1.8 > 2.0.  Troponin 89 >129.  Influenza A, B, SARS, respiratory, RSV was negative. CT chest without contrast showed bibasilar airspace disease and bronchial wall thickening consistent with multifocal bronchopneumonia.  Small free-flowing bilateral pleural effusions, right greater than left. Chest x-ray showed vascular congestion with persistent bibasilar airspace disease and small bilateral pleural effusions.  Overall findings are consistent with pulmonary edema, and/or underlying infection will be difficult to exclude.  No significant change since prior exam Patient was started on IV ceftriaxone  and azithromycin , IV hydralazine  10 mg x 1 was given due to elevated BP and breathing treatment was provided. Hospitalist was asked admit patient for further evaluation and management.   Of note, patient was recently hospitalized for 3 days for bradycardia which was asymptomatic, patient's  metoprolol  and amiodarone  was discontinued at discharge on 08/08/2023 per cardiology recommendations.  Assessment & Plan:   Principal Problem:   Multifocal pneumonia Active Problems:   PAF (paroxysmal atrial fibrillation) (HCC)   Diabetes mellitus type 2 in nonobese [facility])   Acquired hypothyroidism   Mixed hyperlipidemia   Chronic combined systolic and diastolic CHF (congestive heart failure) (HCC)   Hypertensive urgency   Elevated troponin   Acute respiratory failure with hypoxia (HCC)   Pulmonary edema   Severe sepsis (HCC)   Bilateral pleural effusion  Severe sepsis and acute hypoxic respiratory failure secondary to multifocal/healthcare associated pneumonia, POA: CT chest was suggestive of multifocal bronchopneumonia, patient with recent hospitalization for 3 days from 08/05/2023 to 08/08/2023, meeting criteria for healthcare associated pneumonia, also met criteria for severe sepsis based on tachycardia, tachypnea, fever, acute hypoxic respiratory failure and lactic acid of> 2.  Patient has been started on Rocephin  and Zithromax  which I will continue.  She is still requiring 2 L of oxygen  and we will wean as we are able to.  Follow culture and tailor antibiotics.  Patient's heart rate remains around 120-130, she is in sinus rhythm.  Noting that patient's amiodarone  and beta-blockers were discontinued during recent hospitalization due to sinus bradycardia and ironically, she was under my care at that time but now that heart rate is high, I will start her on Lopressor /low-dose 25 mg p.o. twice daily with holding parameters to hold if heart rate is less than 80.  Nurse aware.  Acute on chronic combined systolic and diastolic congestive heart failure/volume overload bilateral pleural effusion/acute pulmonary edema/ESRD on HD, POA: Chest x-ray was suggestive of small bilateral pleural effusion and pulmonary edema  Lasix  cannot be given due to patient not being able to form urine anymore.  She has  ESRD on HD.  Nephrology is consulted for volume management.  Addendum 4:13 PM: Went to see patient the second time around 3:50 PM.  Patient's husband and granddaughter were at the bedside as well as the patient's nurse and nurse tech.  Confirmed CODE STATUS/full code with the husband.  Patient was tachypneic, using accessory muscles and lethargic.  On lung examination, she had diminished breath sounds with crackles and wheezes.  She clinically does appear to be fluid overloaded.  I called nephrologist Dr. Tobie and informed him about my assessment and that I think patient may not wait for dialysis until tomorrow morning and suggested that dialysis should be done as soon as possible.  He said that he is going to arrange dialysis.  Some confusion about who is on-call from nephrology group so I have kept Dr. Geralynn in the loop about this as well.  Elevated troponin possibly secondary to type II demand ischemia, rule out NSTEMI Troponin 89 > 129 > 169 > 224> 280> 280; patient denies chest pain This is possibly secondary to sepsis.  Cardiologist on-call (Dr. Paticia Tobie) was consulted and agreed this is possibly due to sepsis.  No aspirin  or heparin  was recommended, also patient has history of GI bleed.  I will order transthoracic echo to rule out wall motion abnormality, last echo in chart from January 2023.  Diarrhea: Patient was noted to have 2 loose bowel movements this morning.  Abdomen is benign.  Check for C. difficile.   Prolonged QT interval/hypomagnesemia QTc 506 ms  Avoid QT prolonging drugs.  Magnesium  is low, will replenish.   Hypertensive urgency-resolved, continue home dose of hydralazine .   Paroxysmal atrial fibrillation: Rate controlled and stable. Patient no longer on anticoagulation therapy due to history of anemia and previous GI bleed.  Mixed hyperlipidemia Continue Crestor .   Type 2 diabetes ruled out: Patient's all recent hemoglobin A1c have been under 5.5, ruling out type 2  diabetes mellitus.   Acquired hypothyroidism: Continue Synthroid .  DVT prophylaxis: enoxaparin  (LOVENOX ) injection 30 mg Start: 08/20/23 1000 SCDs Start: 08/20/23 0022   Code Status: Full Code  Family Communication:  None present at bedside.    Status is: Inpatient Remains inpatient appropriate because: Patient very sick and requires inpatient care as mentioned above.   Estimated body mass index is 21.24 kg/m as calculated from the following:   Height as of this encounter: 5' 1 (1.549 m).   Weight as of this encounter: 51 kg.    Nutritional Assessment: Body mass index is 21.24 kg/m.SABRA Seen by dietician.  I agree with the assessment and plan as outlined below: Nutrition Status:        . Skin Assessment: I have examined the patient's skin and I agree with the wound assessment as performed by the wound care RN as outlined below:    Consultants:  Nephrology  Procedures:  None  Antimicrobials:  Anti-infectives (From admission, onward)    Start     Dose/Rate Route Frequency Ordered Stop   08/20/23 1000  cefTRIAXone  (ROCEPHIN ) 2 g in sodium chloride  0.9 % 100 mL IVPB        2 g 200 mL/hr over 30 Minutes Intravenous Every 24 hours 08/20/23 0026 08/24/23 0959   08/20/23 1000  azithromycin  (ZITHROMAX ) 500 mg in sodium chloride  0.9 % 250 mL IVPB        500 mg 250 mL/hr over 60  Minutes Intravenous Every 24 hours 08/20/23 0026 08/24/23 0959   08/19/23 2300  cefTRIAXone  (ROCEPHIN ) 1 g in sodium chloride  0.9 % 100 mL IVPB        1 g 200 mL/hr over 30 Minutes Intravenous  Once 08/19/23 2251 08/19/23 2344   08/19/23 2300  azithromycin  (ZITHROMAX ) 500 mg in sodium chloride  0.9 % 250 mL IVPB        500 mg 250 mL/hr over 60 Minutes Intravenous  Once 08/19/23 2251 08/20/23 0114         Subjective: Seen and examined, she is slightly lethargic but arousable.  At baseline, per my memory, she is alert and oriented to self and place but she could never tell me the month or the year  him and when she was in better shape during recent hospitalization.  She was able to tell me her name and the fact that she was in the hospital.  She denied any complaints but appeared to be in pain.  Objective: Vitals:   08/20/23 0600 08/20/23 0700 08/20/23 0730 08/20/23 0744  BP: (!) 157/34 (!) 91/15 (!) 143/33   Pulse: 92 79 85   Resp: (!) 30 (!) 28    Temp:  (!) 100.8 F (38.2 C)  98.7 F (37.1 C)  TempSrc:  Oral  Axillary  SpO2: 99% 99% 98%   Weight:      Height:        Intake/Output Summary (Last 24 hours) at 08/20/2023 0853 Last data filed at 08/20/2023 0322 Gross per 24 hour  Intake 350.23 ml  Output --  Net 350.23 ml   Filed Weights   08/19/23 2054 08/20/23 0015  Weight: 51 kg 51 kg    Examination:  General exam: Appears in pain. Respiratory system: Clear to auscultation.  Poor inspiratory effort Cardiovascular system: S1 & S2 heard, sinus tachycardia. No JVD, murmurs, rubs, gallops or clicks. No pedal edema. Gastrointestinal system: Abdomen is nondistended, soft and nontender. No organomegaly or masses felt. Normal bowel sounds heard. Central nervous system: Alert and oriented x 2. Data Reviewed: I have personally reviewed following labs and imaging studies  CBC: Recent Labs  Lab 08/19/23 2056 08/20/23 0407  WBC 6.8 7.0  HGB 8.1* 7.1*  HCT 25.3* 22.3*  MCV 114.5* 116.1*  PLT 230 193   Basic Metabolic Panel: Recent Labs  Lab 08/19/23 2056 08/20/23 0407  NA 138 136  K 3.5 3.4*  CL 97* 97*  CO2 29 25  GLUCOSE 185* 163*  BUN 22 26*  CREATININE 3.36* 3.78*  CALCIUM  9.2 8.9  MG  --  1.4*  PHOS  --  3.2   GFR: Estimated Creatinine Clearance: 8.5 mL/min (A) (by C-G formula based on SCr of 3.78 mg/dL (H)). Liver Function Tests: Recent Labs  Lab 08/20/23 0407  AST 34  ALT 19  ALKPHOS 140*  BILITOT 1.0  PROT 6.6  ALBUMIN  2.9*   No results for input(s): LIPASE, AMYLASE in the last 168 hours. No results for input(s): AMMONIA in the last  168 hours. Coagulation Profile: Recent Labs  Lab 08/19/23 2102  INR 1.0   Cardiac Enzymes: No results for input(s): CKTOTAL, CKMB, CKMBINDEX, TROPONINI in the last 168 hours. BNP (last 3 results) No results for input(s): PROBNP in the last 8760 hours. HbA1C: No results for input(s): HGBA1C in the last 72 hours. CBG: No results for input(s): GLUCAP in the last 168 hours. Lipid Profile: No results for input(s): CHOL, HDL, LDLCALC, TRIG, CHOLHDL, LDLDIRECT in the last  72 hours. Thyroid  Function Tests: No results for input(s): TSH, T4TOTAL, FREET4, T3FREE, THYROIDAB in the last 72 hours. Anemia Panel: No results for input(s): VITAMINB12, FOLATE, FERRITIN, TIBC, IRON , RETICCTPCT in the last 72 hours. Sepsis Labs: Recent Labs  Lab 08/19/23 2102 08/19/23 2247 08/20/23 0145 08/20/23 0407  PROCALCITON  --   --   --  0.44  LATICACIDVEN 1.8 2.0* 2.2* 2.4*    Recent Results (from the past 240 hours)  MRSA Next Gen by PCR, Nasal     Status: None   Collection Time: 08/19/23 12:20 AM   Specimen: Nasal Mucosa; Nasal Swab  Result Value Ref Range Status   MRSA by PCR Next Gen NOT DETECTED NOT DETECTED Final    Comment: (NOTE) The GeneXpert MRSA Assay (FDA approved for NASAL specimens only), is one component of a comprehensive MRSA colonization surveillance program. It is not intended to diagnose MRSA infection nor to guide or monitor treatment for MRSA infections. Test performance is not FDA approved in patients less than 58 years old. Performed at [facility], [address] [place], [place] [address]   Resp panel by RT-PCR (RSV, Flu A&B, Covid) Anterior Nasal Swab     Status: None   Collection Time: 08/19/23  9:06 PM   Specimen: Anterior Nasal Swab  Result Value Ref Range Status   SARS Coronavirus 2 by RT PCR NEGATIVE NEGATIVE Final    Comment: (NOTE) SARS-CoV-2 target nucleic acids are NOT DETECTED.  The SARS-CoV-2 RNA is  generally detectable in upper respiratory specimens during the acute phase of infection. The lowest concentration of SARS-CoV-2 viral copies this assay can detect is 138 copies/mL. A negative result does not preclude SARS-Cov-2 infection and should not be used as the sole basis for treatment or other patient management decisions. A negative result may occur with  improper specimen collection/handling, submission of specimen other than nasopharyngeal swab, presence of viral mutation(s) within the areas targeted by this assay, and inadequate number of viral copies(<138 copies/mL). A negative result must be combined with clinical observations, patient history, and epidemiological information. The expected result is Negative.  Fact Sheet for Patients:  bloggercourse.com  Fact Sheet for Healthcare Providers:  seriousbroker.it  This test is no t yet approved or cleared by the [place]  FDA and  has been authorized for detection and/or diagnosis of SARS-CoV-2 by FDA under an Emergency Use Authorization (EUA). This EUA will remain  in effect (meaning this test can be used) for the duration of the COVID-19 declaration under Section 564(b)(1) of the Act, 21 U.S.C.section 360bbb-3(b)(1), unless the authorization is terminated  or revoked sooner.       Influenza A by PCR NEGATIVE NEGATIVE Final   Influenza B by PCR NEGATIVE NEGATIVE Final    Comment: (NOTE) The Xpert Xpress SARS-CoV-2/FLU/RSV plus assay is intended as an aid in the diagnosis of influenza from Nasopharyngeal swab specimens and should not be used as a sole basis for treatment. Nasal washings and aspirates are unacceptable for Xpert Xpress SARS-CoV-2/FLU/RSV testing.  Fact Sheet for Patients: bloggercourse.com  Fact Sheet for Healthcare Providers: seriousbroker.it  This test is not yet approved or cleared by the [place]  [place] FDA and has been authorized for detection and/or diagnosis of SARS-CoV-2 by FDA under an Emergency Use Authorization (EUA). This EUA will remain in effect (meaning this test can be used) for the duration of the COVID-19 declaration under Section 564(b)(1) of the Act, 21 U.S.C. section 360bbb-3(b)(1), unless the authorization is terminated or revoked.  Resp Syncytial Virus by PCR NEGATIVE NEGATIVE Final    Comment: (NOTE) Fact Sheet for Patients: bloggercourse.com  Fact Sheet for Healthcare Providers: seriousbroker.it  This test is not yet approved or cleared by the [place]  FDA and has been authorized for detection and/or diagnosis of SARS-CoV-2 by FDA under an Emergency Use Authorization (EUA). This EUA will remain in effect (meaning this test can be used) for the duration of the COVID-19 declaration under Section 564(b)(1) of the Act, 21 U.S.C. section 360bbb-3(b)(1), unless the authorization is terminated or revoked.  Performed at [facility], [name] [place], [place] [address]      Radiology Studies: CT Chest Wo Contrast Result Date: 08/19/2023 CLINICAL DATA:  Left-sided chest pain for 1 day, cough EXAM: CT CHEST WITHOUT CONTRAST TECHNIQUE: Multidetector CT imaging of the chest was performed following the standard protocol without IV contrast. RADIATION DOSE REDUCTION: This exam was performed according to the departmental dose-optimization program which includes automated exposure control, adjustment of the mA and/or kV according to patient size and/or use of iterative reconstruction technique. COMPARISON:  08/19/2023, 10/23/2022 FINDINGS: Cardiovascular: Unenhanced imaging of the heart demonstrates cardiomegaly without pericardial effusion. Postsurgical changes from prior CABG. Normal caliber of the thoracic aorta. There is extensive atherosclerosis of the aorta and native coronary vessels. Left internal  jugular dialysis catheter tip within the superior vena cava at the atriocaval junction. Mediastinum/Nodes: Mediastinal adenopathy, largest lymph node in the precarinal region measuring 15 mm. Thyroid , trachea, and esophagus are unremarkable. Lungs/Pleura: For there are small bilateral pleural effusions, volume estimated less than 500 cc each. There is patchy bibasilar airspace disease, greatest in the right middle and right lower lobes. Bibasilar bronchial wall thickening. No pneumothorax. Upper Abdomen: Bilateral renal atrophy consistent with end-stage renal disease. No acute upper abdominal findings. Musculoskeletal: No acute or destructive bony abnormalities. Severe right shoulder osteoarthritis. Reconstructed images demonstrate no additional findings. IMPRESSION: 1. Bibasilar airspace disease and bronchial wall thickening, consistent with multifocal bronchopneumonia. 2. Small free-flowing bilateral pleural effusions, right greater than left. 3. Mediastinal lymphadenopathy. 4. Cardiomegaly. 5.  Aortic Atherosclerosis (ICD10-I70.0). Electronically Signed   By: Ozell Daring M.D.   On: 08/19/2023 22:41   DG Chest Portable 1 View Result Date: 08/19/2023 CLINICAL DATA:  Left-sided chest pain, cough EXAM: PORTABLE CHEST 1 VIEW COMPARISON:  08/05/2023 FINDINGS: Single frontal view of the chest demonstrates left internal jugular dialysis catheter, tip overlying the right atrium. Postsurgical changes from CABG. The cardiac silhouette is unremarkable. There is persistent pulmonary vascular congestion. Patchy bibasilar airspace disease and small bilateral pleural effusions are noted, which could reflect edema or infection. No significant change since prior exam. No pneumothorax. No acute bony abnormalities. IMPRESSION: 1. Vascular congestion, with persistent bibasilar airspace disease and small bilateral pleural effusions. Overall, findings are consistent with pulmonary edema, though underlying infection would be  difficult to exclude. No significant change since prior exam. Electronically Signed   By: Ozell Daring M.D.   On: 08/19/2023 21:42    Scheduled Meds:  sodium chloride    Intravenous Once   Chlorhexidine  Gluconate Cloth  6 each Topical Q0600   [START ON 08/21/2023] cinacalcet   30 mg Oral Q M,W,F-HD   dextromethorphan -guaiFENesin   1 tablet Oral BID   enoxaparin  (LOVENOX ) injection  30 mg Subcutaneous Q24H   hydrALAZINE   25 mg Oral BID   levothyroxine   125 mcg Oral Q0600   multivitamin  1 tablet Oral Daily   rosuvastatin   10 mg Oral Daily   Continuous Infusions:  azithromycin   cefTRIAXone  (ROCEPHIN )  IV     magnesium  sulfate bolus IVPB       LOS: 1 day   Fredia Skeeter, MD [facility]  08/20/2023, 8:53 AM   *Please note that this is a verbal dictation therefore any spelling or grammatical errors are due to the Dragon Medical One system interpretation.  Please page via Amion and do not message via secure chat for urgent patient care matters. Secure chat can be used for non urgent patient care matters.  How to contact the [facility] Attending or Consulting provider 7A - 7P or covering provider during after hours 7P -7A, for this patient?  Check the care team in [facility] and look for a) attending/consulting [facility] provider listed and b) the [facility] team listed. Page or secure chat 7A-7P. Log into [email or web address] and use [facility]'s universal password to access. If you do not have the password, please contact the hospital operator. Locate the [facility] provider you are looking for under [facility] and page to a number that you can be directly reached. If you still have difficulty reaching the provider, please page the [facility] (Director on Call) for the Hospitalists listed on amion for assistance.

## 2023-08-20 NOTE — Progress Notes (Signed)
   08/20/23 0440  Provider Notification  Provider Name/Title Dr. Manfred  Date Provider Notified 08/20/23  Time Provider Notified [phone]  Method of Notification Page  Notification Reason Critical Result  Test performed and critical result Lactic acid 2.4  Date Critical Result Received 08/20/23  Time Critical Result Received [phone]

## 2023-08-20 NOTE — Treatment Plan (Signed)
 83 year old with a history of ESRD on hemodialysis, CAD status post CABG, HFrEF, type 2 diabetes, hypertension, A-fib not on anticoagulant, history of GI bleed presenting with cough, body aches, and fever and concern for multifocal pneumonia.  Paged for elevated troponin.  Provider at [facility] reports that patient is not having any chest pain or chest pressure.  Her primary complaint and the shortness of breath is likely due to her multifocal bronchopneumonia and bilateral pleural effusions.  Troponins increased from 129-224 over the span of 6 hours.  Other notable labs include creatinine of 3.78 on dialysis, elevated lactate to 2.2, hemoglobin 7.1 down from 8.1 on admission.  Her ECG shows ST changes in lateral leads that are similar to her prior presentations October 2024 with a higher heart rate.  Overall low concern for an ACS event and most likely due to demand ischemia.  Do not recommend aspirin  loading in heparin  ACS protocol at this time.  In addition patient has history of GI bleed with most recent hospitalization in June 2025.

## 2023-08-20 NOTE — Progress Notes (Signed)
 PIV placed in  proximal forearm after multiple attempts.  PIV placed in RUpper arm previously had infiltrated. Please monitor site frequently.  LArm restricted due to HD. Recommend central line placement if this PIV is lost.

## 2023-08-20 NOTE — TOC Progression Note (Signed)
 Transition of Care [facility]) - Progression Note    Patient Details  Name: Shawna [name] MRN: [id number] Date of Birth: Aug 13, 1940  Transition of Care [facility]) CM/SW Contact  Lorraine LILLETTE Fenton, LCSW Phone Number: 08/20/2023, 2:31 PM  Clinical Narrative:     CSW reviewed record, Pt high risk readmission score.  Assessment completed and documented note filed by TOC D. Waddell dated 1/27.  Pt active with [facility] per documentation. TOC to continue to follow.  Expected Discharge Plan: Home w Home Health Services Barriers to Discharge: Continued Medical Work up  Expected Discharge Plan and Services In-house Referral: Clinical Social Work   Post Acute Care Choice: Home Health Living arrangements for the past 2 months: Single Family Home                                       Social Determinants of Health (SDOH) Interventions SDOH Screenings   Food Insecurity: No Food Insecurity (08/20/2023)  Housing: Unknown (08/20/2023)  Transportation Needs: Patient Declined (08/06/2023)  Utilities: Patient Declined (08/06/2023)  Financial Resource Strain: Low Risk  (12/14/2018)  Physical Activity: Unknown (12/14/2018)  Social Connections: Patient Declined (08/06/2023)  Stress: No Stress Concern Present (12/14/2018)  Tobacco Use: Low Risk  (08/19/2023)  Health Literacy: Adequate Health Literacy (08/01/2023)   [facility]  Recent Concern: Health Literacy - Inadequate Health Literacy (07/20/2023)   Received from [facility]    Readmission Risk Interventions    08/20/2023    2:28 PM 05/02/2023    2:17 PM 01/29/2023   11:08 AM  Readmission Risk Prevention Plan  Transportation Screening Complete Complete Complete  Medication Review Oceanographer) Complete Complete Complete  PCP or Specialist appointment within 3-5 days of discharge Complete  Complete  HRI or Home Care Consult Complete Complete Complete  SW Recovery Care/Counseling Consult Complete Complete Complete  Palliative Care  Screening  Not Applicable Not Applicable  Skilled Nursing Facility  Not Applicable Not Applicable

## 2023-08-20 NOTE — Progress Notes (Signed)
 CRITICAL RESULTS   08/20/23 0455  Provider Notification  Provider Name/Title Dr.Adefeso  Date Provider Notified 08/20/23  Time Provider Notified 0455  Method of Notification Page  Notification Reason Critical Result  Test performed and critical result Troponin 221  Date Critical Result Received 08/20/23  Time Critical Result Received [phone]

## 2023-08-21 ENCOUNTER — Inpatient Hospital Stay (HOSPITAL_COMMUNITY): Payer: Medicare Other

## 2023-08-21 ENCOUNTER — Other Ambulatory Visit (HOSPITAL_COMMUNITY): Payer: Self-pay | Admitting: *Deleted

## 2023-08-21 DIAGNOSIS — J189 Pneumonia, unspecified organism: Secondary | ICD-10-CM | POA: Diagnosis not present

## 2023-08-21 LAB — RENAL FUNCTION PANEL
Albumin: 3 g/dL — ABNORMAL LOW (ref 3.5–5.0)
Anion gap: 12 (ref 5–15)
BUN: 29 mg/dL — ABNORMAL HIGH (ref 8–23)
CO2: 26 mmol/L (ref 22–32)
Calcium: 8.7 mg/dL — ABNORMAL LOW (ref 8.9–10.3)
Chloride: 97 mmol/L — ABNORMAL LOW (ref 98–111)
Creatinine, Ser: 4.37 mg/dL — ABNORMAL HIGH (ref 0.44–1.00)
GFR, Estimated: 10 mL/min — ABNORMAL LOW (ref >60)
Glucose, Bld: 107 mg/dL — ABNORMAL HIGH (ref 70–99)
Phosphorus: 4.5 mg/dL (ref 2.5–4.6)
Potassium: 3.9 mmol/L (ref 3.5–5.1)
Sodium: 135 mmol/L (ref 135–145)

## 2023-08-21 LAB — CBC
HCT: 19.5 % — ABNORMAL LOW (ref 36.0–46.0)
Hemoglobin: 6.2 g/dL — CL (ref 12.0–15.0)
MCH: 37.6 pg — ABNORMAL HIGH (ref 26.0–34.0)
MCHC: 31.8 g/dL (ref 30.0–36.0)
MCV: 118.2 fL — ABNORMAL HIGH (ref 80.0–100.0)
Platelets: 177 10*3/uL (ref 150–400)
RBC: 1.65 MIL/uL — ABNORMAL LOW (ref 3.87–5.11)
RDW: 24 % — ABNORMAL HIGH (ref 11.5–15.5)
WBC: 4.5 10*3/uL (ref 4.0–10.5)
nRBC: 0 % (ref 0.0–0.2)

## 2023-08-21 LAB — PREPARE RBC (CROSSMATCH)

## 2023-08-21 MED ORDER — CALCITRIOL 0.25 MCG PO CAPS
0.7500 ug | ORAL_CAPSULE | ORAL | Status: DC
  Administered 2023-08-21 – 2023-08-23 (×2): 0.75 ug via ORAL
  Filled 2023-08-21: qty 3

## 2023-08-21 MED ORDER — HEPARIN SODIUM (PORCINE) 1000 UNIT/ML IJ SOLN
INTRAMUSCULAR | Status: AC
  Filled 2023-08-21: qty 4

## 2023-08-21 MED ORDER — DARBEPOETIN ALFA 150 MCG/0.3ML IJ SOSY
150.0000 ug | PREFILLED_SYRINGE | INTRAMUSCULAR | Status: DC
  Administered 2023-08-21: 150 ug via SUBCUTANEOUS
  Filled 2023-08-21: qty 0.3

## 2023-08-21 MED ORDER — SODIUM CHLORIDE 0.9% IV SOLUTION: Freq: Once | INTRAVENOUS | Status: AC

## 2023-08-21 MED ORDER — SODIUM CHLORIDE 0.9 % IV SOLN
100.0000 mg | Freq: Two times a day (BID) | INTRAVENOUS | Status: DC
  Administered 2023-08-21 – 2023-08-23 (×5): 100 mg via INTRAVENOUS
  Filled 2023-08-21 (×7): qty 100

## 2023-08-21 NOTE — TOC Progression Note (Signed)
 Transition of Care [facility]) - Progression Note    Patient Details  Name: Shawna [name] MRN: [id number] Date of Birth: [birth year]  Transition of Care [facility]) CM/SW Contact  [place]ville Lax, [place] Phone Number: 08/21/2023, 11:29 AM  Clinical Narrative:    Pt is from home. CSW spoke to Isa Manuel with [facility] who states that pt is active with them for [facility] PT, pt will need new orders prior to D/C. TOC to follow.   Expected Discharge Plan: Home w Home Health Services Barriers to Discharge: Continued Medical Work up  Expected Discharge Plan and Services In-house Referral: Clinical Social Work   Post Acute Care Choice: Home Health Living arrangements for the past 2 months: Single Family Home                                       Social Determinants of Health (SDOH) Interventions SDOH Screenings   Food Insecurity: No Food Insecurity (08/20/2023)  Housing: Unknown (08/20/2023)  Transportation Needs: No Transportation Needs (08/21/2023)  Utilities: Not At Risk (08/21/2023)  Financial Resource Strain: Low Risk  (12/14/2018)  Physical Activity: Unknown (12/14/2018)  Social Connections: Unknown (08/21/2023)  Stress: No Stress Concern Present (12/14/2018)  Tobacco Use: Low Risk  (08/19/2023)  Health Literacy: Adequate Health Literacy (08/01/2023)   Received from [facility]  Recent Concern: Health Literacy - Inadequate Health Literacy (07/20/2023)   Received from [facility]    Readmission Risk Interventions    08/20/2023    2:28 PM 05/02/2023    2:17 PM 01/29/2023   11:08 AM  Readmission Risk Prevention Plan  Transportation Screening Complete Complete Complete  Medication Review Oceanographer) Complete Complete Complete  PCP or Specialist appointment within 3-5 days of discharge Complete  Complete  HRI or Home Care Consult Complete Complete Complete  SW Recovery Care/Counseling Consult Complete Complete Complete  Palliative Care Screening  Not Applicable Not Applicable  Skilled  Nursing Facility  Not Applicable Not Applicable

## 2023-08-21 NOTE — Progress Notes (Signed)
 PROGRESS NOTE   [name]  BJY:[id number] DOB: 06-Feb-1941 DOA: 08/19/2023 PCP: [facility]   Brief Narrative:  HPI: Shawna [name] is a 83 y.o. female with medical history significant of ESRD on hemodialysis (MWF), CAD status post CABG, HFrEF, T2DM, hypertension, GERD, atrial fibrillation (not on anticoagulants) who presents to the emergency department with complaint of cough which started today.  She complained of 2 to 3-day onset of not feeling well and complained of body aches, productive cough (unsure of sputum color), subjective fever.  Shortness of breath worsened today, so she presented to the ED for further evaluation and management.   ED Course: In the emergency department, she was tachypneic, tachycardic, BP was 178/80, O2 sat was 89% on room air, this improved to 100% on supplemental oxygen  via [place] at 2 LPM.  Temperature 98.6 F.  Workup in the ED showed hemoglobin 8.1, hematocrit 25.3, MCV 114.5.  BMP was normal except for chloride 97, blood glucose 185, creatinine 3.36, GFR 13.  Lactic acid 1.8 > 2.0.  Troponin 89 >129.  Influenza A, B, SARS, respiratory, RSV was negative. CT chest without contrast showed bibasilar airspace disease and bronchial wall thickening consistent with multifocal bronchopneumonia.  Small free-flowing bilateral pleural effusions, right greater than left. Chest x-ray showed vascular congestion with persistent bibasilar airspace disease and small bilateral pleural effusions.  Overall findings are consistent with pulmonary edema, and/or underlying infection will be difficult to exclude.  No significant change since prior exam Patient was started on IV ceftriaxone  and azithromycin , IV hydralazine  10 mg x 1 was given due to elevated BP and breathing treatment was provided. Hospitalist was asked admit patient for further evaluation and management.   Of note, patient was recently hospitalized for 3 days for bradycardia which was asymptomatic, patient's  metoprolol  and amiodarone  was discontinued at discharge on 08/08/2023 per cardiology recommendations.  Assessment & Plan:   Principal Problem:   Multifocal pneumonia Active Problems:   PAF (paroxysmal atrial fibrillation) (HCC)   Diabetes mellitus type 2 in nonobese [facility])   Acquired hypothyroidism   Mixed hyperlipidemia   Chronic combined systolic and diastolic CHF (congestive heart failure) (HCC)   Hypertensive urgency   Elevated troponin   Acute respiratory failure with hypoxia (HCC)   Pulmonary edema   Severe sepsis (HCC)   Bilateral pleural effusion  1)Severe sepsis and acute hypoxic respiratory failure secondary to multifocal/healthcare associated pneumonia, POA:  PCT 0.44, no leukocytosis -Continue Rocephin  and doxycycline  -Azithromycin  discontinued due to concerns about QT prolongation -No leukocytosis, no fevers  2)Acute on chronic combined systolic and diastolic congestive heart failure/volume overload bilateral pleural effusion/acute pulmonary edema/ESRD on HD, POA: -Chest x-ray was consistent with pulmonary edema -Patient had elevated troponin in the setting of CHF/demand ischemia and ESRD ----troponin pattern is Not consistent with ACS -Continue to use hemodialysis to address volume status -Echocardiogram pending  3) acute on chronic symptomatic anemia-- Hgb down to 6.2 -Patient with underlying chronic anemia of ESRD compounded by history of GI bleed as well -Discussed with Dr. Zelda Hickman from nephrology service -Patient is to be transfused during hemodialysis session---will try to give 2 units of PRBC on 08/21/2023 -Defer timing and dosing of ESA agent/Procrit  to nephrology team  4)Diarrhea: -Okay to check C. difficile if further loose stools   5)Prolonged QT interval/hypomagnesemia QTc 506 ms  Avoid QT prolonging drugs.  Magnesium  is low, will replenish. -Discontinued azithromycin    6)Hypertensive urgency-resolved, continue home dose of hydralazine .    7)Paroxysmal atrial fibrillation: -No  anticoagulation due to history of GI bleed and anemia of ESRD with symptomatic anemia requiring transfusion -Beta-blockers and amiodarone  were discontinued by Dr. Lilyan Remedies during recent hospitalization due to bradycardia -Patient has become more tachycardic, so Dr Lilyan Remedies this admission restarted metoprolol  -With hold parameters  8) acute hypoxic respiratory failure--multifactorial due to #1, #2 #3 above -- Currently requiring 2 L of oxygen  via nasal cannula -Anticipate improvement after hemodialysis session  Mixed hyperlipidemia Continue Crestor .    Acquired hypothyroidism: Continue Synthroid .  DVT prophylaxis: enoxaparin  (LOVENOX ) injection 30 mg Start: 08/20/23 1000 SCDs Start: 08/20/23 0022   Code Status: Full Code  Family Communication:  None present at bedside.    Status is: Inpatient Remains inpatient appropriate because: Patient very sick and requires inpatient care as mentioned above.   Estimated body mass index is 21.2 kg/m as calculated from the following:   Height as of this encounter: 5\' 1"  (1.549 m).   Weight as of this encounter: 50.9 kg.    Nutritional Assessment: Body mass index is 21.2 kg/m.Shawna [name] Seen by dietician.  I agree with the assessment and plan as outlined below: Nutrition Status:      Consultants:  Nephrology  Procedures:  HD  Antimicrobials:  Anti-infectives (From admission, onward)    Start     Dose/Rate Route Frequency Ordered Stop   08/21/23 1000  doxycycline  (VIBRAMYCIN ) 100 mg in sodium chloride  0.9 % 250 mL IVPB        100 mg 125 mL/hr over 120 Minutes Intravenous Every 12 hours 08/21/23 0855     08/20/23 1000  cefTRIAXone  (ROCEPHIN ) 2 g in sodium chloride  0.9 % 100 mL IVPB        2 g 200 mL/hr over 30 Minutes Intravenous Every 24 hours 08/20/23 0026 08/24/23 0959   08/20/23 1000  azithromycin  (ZITHROMAX ) 500 mg in sodium chloride  0.9 % 250 mL IVPB  Status:  Discontinued        500 mg 250  mL/hr over 60 Minutes Intravenous Every 24 hours 08/20/23 0026 08/21/23 0855   08/19/23 2300  cefTRIAXone  (ROCEPHIN ) 1 g in sodium chloride  0.9 % 100 mL IVPB        1 g 200 mL/hr over 30 Minutes Intravenous  Once 08/19/23 2251 08/19/23 2344   08/19/23 2300  azithromycin  (ZITHROMAX ) 500 mg in sodium chloride  0.9 % 250 mL IVPB        500 mg 250 mL/hr over 60 Minutes Intravenous  Once 08/19/23 2251 08/20/23 0114         Subjective: - Dyspnea persist, requiring 2 L of oxygen  via nasal cannula - Denies blood in stools -Eagerly awaiting hemodialysis  Objective: Vitals:   08/21/23 1819 08/21/23 1822 08/21/23 1830 08/21/23 1845  BP:   (!) 175/68 (!) 173/67  Pulse: 64 66 65 65  Resp: 16 18 15 16   Temp:  (!) 97.2 F (36.2 C)    TempSrc:  Oral    SpO2: 100% 100% 100% 100%  Weight:      Height:        Intake/Output Summary (Last 24 hours) at 08/21/2023 1906 Last data filed at 08/21/2023 1819 Gross per 24 hour  Intake 220.83 ml  Output 800 ml  Net -579.17 ml   Filed Weights   08/20/23 2145 08/21/23 0458 08/21/23 1701  Weight: 50.6 kg 47.7 kg 50.9 kg     Physical Exam  Gen:- Awake Alert, in no acute distress, no conversational dyspnea HEENT:- [place].AT, No sclera icterus Nose- [place] 2L/min Neck-Supple Neck, +ve  JVD,.  Left IJ hemodialysis catheter Lungs-diminished breath sounds, bibasilar rales  CV- S1, S2 normal, irregular  abd-  +ve B.Sounds, Abd Soft, No tenderness,    Extremity/Skin:- No  edema,   good pedal pulses  Psych-affect is appropriate, oriented x3 Neuro-generalized weakness, no new focal deficits, no tremors   Data Reviewed: I have personally reviewed following labs and imaging studies  CBC: Recent Labs  Lab 08/19/23 2056 08/20/23 0407 08/21/23 1713  WBC 6.8 7.0 4.5  HGB 8.1* 7.1* 6.2*  HCT 25.3* 22.3* 19.5*  MCV 114.5* 116.1* 118.2*  PLT 230 193 177   Basic Metabolic Panel: Recent Labs  Lab 08/19/23 2056 08/20/23 0407 08/21/23 1713  NA 138 136  135  K 3.5 3.4* 3.9  CL 97* 97* 97*  CO2 29 25 26   GLUCOSE 185* 163* 107*  BUN 22 26* 29*  CREATININE 3.36* 3.78* 4.37*  CALCIUM  9.2 8.9 8.7*  MG  --  1.4*  --   PHOS  --  3.2 4.5   GFR: Estimated Creatinine Clearance: 7.4 mL/min (A) (by C-G formula based on SCr of 4.37 mg/dL (H)). Liver Function Tests: Recent Labs  Lab 08/20/23 0407 08/21/23 1713  AST 34  --   ALT 19  --   ALKPHOS 140*  --   BILITOT 1.0  --   PROT 6.6  --   ALBUMIN  2.9* 3.0*   Coagulation Profile: Recent Labs  Lab 08/19/23 2102  INR 1.0   Sepsis Labs: Recent Labs  Lab 08/19/23 2102 08/19/23 2247 08/20/23 0145 08/20/23 0407  PROCALCITON  --   --   --  0.44  LATICACIDVEN 1.8 2.0* 2.2* 2.4*    Recent Results (from the past 240 hours)  MRSA Next Gen by PCR, Nasal     Status: None   Collection Time: 08/19/23 12:20 AM   Specimen: Nasal Mucosa; Nasal Swab  Result Value Ref Range Status   MRSA by PCR Next Gen NOT DETECTED NOT DETECTED Final    Comment: (NOTE) The GeneXpert MRSA Assay (FDA approved for NASAL specimens only), is one component of a comprehensive MRSA colonization surveillance program. It is not intended to diagnose MRSA infection nor to guide or monitor treatment for MRSA infections. Test performance is not FDA approved in patients less than 70 years old. Performed at [facility], [address], [place], [place] [address]   Culture, blood (Routine x 2)     Status: None (Preliminary result)   Collection Time: 08/19/23  9:02 PM   Specimen: BLOOD  Result Value Ref Range Status   Specimen Description BLOOD  Final   Special Requests NONE  Final   Culture   Final    NO GROWTH 2 DAYS Performed at [facility], [address] [place], [place] [address]    Report Status PENDING  Incomplete  Resp panel by RT-PCR (RSV, Flu A&B, Covid) Anterior Nasal Swab     Status: None   Collection Time: 08/19/23  9:06 PM   Specimen: Anterior Nasal Swab  Result Value Ref Range Status   SARS  Coronavirus 2 by RT PCR NEGATIVE NEGATIVE Final    Comment: (NOTE) SARS-CoV-2 target nucleic acids are NOT DETECTED.  The SARS-CoV-2 RNA is generally detectable in upper respiratory specimens during the acute phase of infection. The lowest concentration of SARS-CoV-2 viral copies this assay can detect is 138 copies/mL. A negative result does not preclude SARS-Cov-2 infection and should not be used as the sole basis for treatment or other patient  management decisions. A negative result may occur with  improper specimen collection/handling, submission of specimen other than nasopharyngeal swab, presence of viral mutation(s) within the areas targeted by this assay, and inadequate number of viral copies(<138 copies/mL). A negative result must be combined with clinical observations, patient history, and epidemiological information. The expected result is Negative.  Fact Sheet for Patients:  BloggerCourse.com  Fact Sheet for Healthcare Providers:  SeriousBroker.it  This test is no t yet approved or cleared by the [place]  FDA and  has been authorized for detection and/or diagnosis of SARS-CoV-2 by FDA under an Emergency Use Authorization (EUA). This EUA will remain  in effect (meaning this test can be used) for the duration of the COVID-19 declaration under Section 564(b)(1) of the Act, 21 U.S.C.section 360bbb-3(b)(1), unless the authorization is terminated  or revoked sooner.       Influenza A by PCR NEGATIVE NEGATIVE Final   Influenza B by PCR NEGATIVE NEGATIVE Final    Comment: (NOTE) The Xpert Xpress SARS-CoV-2/FLU/RSV plus assay is intended as an aid in the diagnosis of influenza from Nasopharyngeal swab specimens and should not be used as a sole basis for treatment. Nasal washings and aspirates are unacceptable for Xpert Xpress SARS-CoV-2/FLU/RSV testing.  Fact Sheet for  Patients: BloggerCourse.com  Fact Sheet for Healthcare Providers: SeriousBroker.it  This test is not yet approved or cleared by the [place]  FDA and has been authorized for detection and/or diagnosis of SARS-CoV-2 by FDA under an Emergency Use Authorization (EUA). This EUA will remain in effect (meaning this test can be used) for the duration of the COVID-19 declaration under Section 564(b)(1) of the Act, 21 U.S.C. section 360bbb-3(b)(1), unless the authorization is terminated or revoked.     Resp Syncytial Virus by PCR NEGATIVE NEGATIVE Final    Comment: (NOTE) Fact Sheet for Patients: BloggerCourse.com  Fact Sheet for Healthcare Providers: SeriousBroker.it  This test is not yet approved or cleared by the [place]  FDA and has been authorized for detection and/or diagnosis of SARS-CoV-2 by FDA under an Emergency Use Authorization (EUA). This EUA will remain in effect (meaning this test can be used) for the duration of the COVID-19 declaration under Section 564(b)(1) of the Act, 21 U.S.C. section 360bbb-3(b)(1), unless the authorization is terminated or revoked.  Performed at [facility], [address], [place], [place] [address]   Culture, blood (Routine x 2)     Status: None (Preliminary result)   Collection Time: 08/19/23 10:47 PM   Specimen: BLOOD RIGHT HAND  Result Value Ref Range Status   Specimen Description BLOOD RIGHT HAND  Final   Special Requests NONE  Final   Culture   Final    NO GROWTH 2 DAYS Performed at [facility], [address] [place], [place] [address]    Report Status PENDING  Incomplete     Radiology Studies: CT Chest Wo Contrast Result Date: 08/19/2023 CLINICAL DATA:  Left-sided chest pain for 1 day, cough EXAM: CT CHEST WITHOUT CONTRAST TECHNIQUE: Multidetector CT imaging of the chest was performed following the standard protocol without  IV contrast. RADIATION DOSE REDUCTION: This exam was performed according to the departmental dose-optimization program which includes automated exposure control, adjustment of the mA and/or kV according to patient size and/or use of iterative reconstruction technique. COMPARISON:  08/19/2023, 10/23/2022 FINDINGS: Cardiovascular: Unenhanced imaging of the heart demonstrates cardiomegaly without pericardial effusion. Postsurgical changes from prior CABG. Normal caliber of the thoracic aorta. There is extensive atherosclerosis of the aorta and native  coronary vessels. Left internal jugular dialysis catheter tip within the superior vena cava at the atriocaval junction. Mediastinum/Nodes: Mediastinal adenopathy, largest lymph node in the precarinal region measuring 15 mm. Thyroid , trachea, and esophagus are unremarkable. Lungs/Pleura: For there are small bilateral pleural effusions, volume estimated less than 500 cc each. There is patchy bibasilar airspace disease, greatest in the right middle and right lower lobes. Bibasilar bronchial wall thickening. No pneumothorax. Upper Abdomen: Bilateral renal atrophy consistent with end-stage renal disease. No acute upper abdominal findings. Musculoskeletal: No acute or destructive bony abnormalities. Severe right shoulder osteoarthritis. Reconstructed images demonstrate no additional findings. IMPRESSION: 1. Bibasilar airspace disease and bronchial wall thickening, consistent with multifocal bronchopneumonia. 2. Small free-flowing bilateral pleural effusions, right greater than left. 3. Mediastinal lymphadenopathy. 4. Cardiomegaly. 5.  Aortic Atherosclerosis (ICD10-I70.0). Electronically Signed   By: Bobbye Burrow M.D.   On: 08/19/2023 22:41   DG Chest Portable 1 View Result Date: 08/19/2023 CLINICAL DATA:  Left-sided chest pain, cough EXAM: PORTABLE CHEST 1 VIEW COMPARISON:  08/05/2023 FINDINGS: Single frontal view of the chest demonstrates left internal jugular dialysis  catheter, tip overlying the right atrium. Postsurgical changes from CABG. The cardiac silhouette is unremarkable. There is persistent pulmonary vascular congestion. Patchy bibasilar airspace disease and small bilateral pleural effusions are noted, which could reflect edema or infection. No significant change since prior exam. No pneumothorax. No acute bony abnormalities. IMPRESSION: 1. Vascular congestion, with persistent bibasilar airspace disease and small bilateral pleural effusions. Overall, findings are consistent with pulmonary edema, though underlying infection would be difficult to exclude. No significant change since prior exam. Electronically Signed   By: Bobbye Burrow M.D.   On: 08/19/2023 21:42   Scheduled Meds:  sodium chloride    Intravenous Once   calcitRIOL   0.75 mcg Oral Q M,W,F-HD   Chlorhexidine  Gluconate Cloth  6 each Topical Q0600   Chlorhexidine  Gluconate Cloth  6 each Topical Q0600   cinacalcet   30 mg Oral Q M,W,F-HD   darbepoetin (ARANESP ) injection - DIALYSIS  150 mcg Subcutaneous Q Mon-1800   dextromethorphan -guaiFENesin   1 tablet Oral BID   enoxaparin  (LOVENOX ) injection  30 mg Subcutaneous Q24H   hydrALAZINE   25 mg Oral BID   levothyroxine   125 mcg Oral Q0600   metoprolol  tartrate  25 mg Oral BID   multivitamin  1 tablet Oral Daily   rosuvastatin   10 mg Oral Daily   sodium chloride  flush  10-40 mL Intracatheter Q12H   Continuous Infusions:  cefTRIAXone  (ROCEPHIN )  IV 2 g (08/21/23 1012)   doxycycline  (VIBRAMYCIN ) IV 100 mg (08/21/23 1048)     LOS: 2 days   Colin Dawley, MD [facility]  08/21/2023, 7:06 PM   How to contact the [facility] Attending or Consulting provider 7A - 7P or covering provider during after hours 7P -7A, for this patient?

## 2023-08-21 NOTE — Plan of Care (Signed)
°  Problem: Education: °Goal: Knowledge of General Education information will improve °Description: Including pain rating scale, medication(s)/side effects and non-pharmacologic comfort measures °Outcome: Progressing °  °Problem: Health Behavior/Discharge Planning: °Goal: Ability to manage health-related needs will improve °Outcome: Progressing °  °Problem: Coping: °Goal: Level of anxiety will decrease °Outcome: Progressing °  °Problem: Elimination: °Goal: Will not experience complications related to bowel motility °Outcome: Progressing °  °

## 2023-08-21 NOTE — Plan of Care (Signed)
  Problem: Clinical Measurements: Goal: Ability to maintain clinical measurements within normal limits will improve Outcome: Progressing   Problem: Coping: Goal: Level of anxiety will decrease Outcome: Progressing   Problem: Pain Managment: Goal: General experience of comfort will improve and/or be controlled Outcome: Progressing   Problem: Safety: Goal: Ability to remain free from injury will improve Outcome: Progressing

## 2023-08-21 NOTE — Consult Note (Signed)
 ESRD Consult Note  Reason for consult: ESRD, provision of dialysis  Assessment/Recommendations:  ESRD  -outpatient HD orders: [name].  4 hours.  EDW 51 kg.  TDC.  Nepro 17 H Elisio.  Flow rates: 300/500.  2K/2.5 calcium .  Heparin : Only heparin  locks for TDC.  Meds: Calcitriol  0.75 mcg 3 times a week, ID PN, Mircera 200 mcg every 2 weeks (due today 2/10) -HD today  Severe sepsis Multifocal PNA, HCAP -per primary service  Acute on chronic combined systolic and diastolic CHF exacerbation -UF as tolerated with HD  Volume/ hypertension  -UF as tolerated  Anemia of Chronic Kidney Disease -Hgb 7.1, due for transfusion per primary service. Resuming ESA-- Aranesp  150mcg ordered for today -Transfuse PRN for Hgb <7  Secondary Hyperparathyroidism/Hyperphosphatemia - resume home meds, calctriol resumed, monitor phos   Elevated troponin -thought to be secondary to demand ischemia secondary to sepsis. Per primary  Paroxysmal Afib -not on a/c given history of anemia/GIB -per primary  Recommendations were discussed with the primary team.  Cristi Donalds, MD [facility]  History of Present Illness: Shawna [name] is a/an 83 y.o. female with a past medical history of ESRD, h/o CABG, DM2, HTN, afib (not on a/c), GERD who presents with cough, SOB, subjective fevers. Found to have multifocal pneumonia/HCAP and acute on chronic combined CHF exacerbation. She did undergo dialysis yesterday however BP dropped therefore UF limited (0.8L). Patient seen and examined bedside. She reports that her breathing is better. She is due for a PRBC transfusion. Will be trying for HD today. No other complaints.   Medications:  Current Facility-Administered Medications  Medication Dose Route Frequency Provider Last Rate Last Admin   0.9 %  sodium chloride  infusion (Manually program via Guardrails IV Fluids)   Intravenous Once Pahwani, Ravi, MD   Paused at 08/20/23 1236   acetaminophen   (TYLENOL ) tablet 650 mg  650 mg Oral Q6H PRN Adefeso, Oladapo, DO   650 mg at 08/21/23 0453   azithromycin  (ZITHROMAX ) 500 mg in sodium chloride  0.9 % 250 mL IVPB  500 mg Intravenous Q24H Adefeso, Oladapo, DO   Stopped at 08/20/23 1030   cefTRIAXone  (ROCEPHIN ) 2 g in sodium chloride  0.9 % 100 mL IVPB  2 g Intravenous Q24H Adefeso, Oladapo, DO   Stopped at 08/20/23 1610   Chlorhexidine  Gluconate Cloth 2 % PADS 6 each  6 each Topical Q0600 Adefeso, Oladapo, DO   6 each at 08/21/23 0454   Chlorhexidine  Gluconate Cloth 2 % PADS 6 each  6 each Topical Q0600 Melodie Spry, MD       cinacalcet  (SENSIPAR ) tablet 30 mg  30 mg Oral Q M,W,F-HD Adefeso, Oladapo, DO       dextromethorphan -guaiFENesin  (MUCINEX  DM) 30-600 MG per 12 hr tablet 1 tablet  1 tablet Oral BID Adefeso, Oladapo, DO   1 tablet at 08/20/23 0909   enoxaparin  (LOVENOX ) injection 30 mg  30 mg Subcutaneous Q24H Adefeso, Oladapo, DO   30 mg at 08/20/23 0854   guaiFENesin -dextromethorphan  (ROBITUSSIN DM) 100-10 MG/5ML syrup 5 mL  5 mL Oral Q4H PRN Adefeso, Oladapo, DO       heparin  injection 1,000 Units  1,000 Units Intracatheter PRN Melodie Spry, MD       hydrALAZINE  (APRESOLINE ) tablet 25 mg  25 mg Oral BID Adefeso, Oladapo, DO   25 mg at 08/20/23 0909   levothyroxine  (SYNTHROID ) tablet 125 mcg  125 mcg Oral Q0600 Adefeso, Oladapo, DO   125 mcg at 08/21/23 0454  metoprolol  tartrate (LOPRESSOR ) tablet 25 mg  25 mg Oral BID Pahwani, Ravi, MD   25 mg at 08/20/23 1005   multivitamin (RENA-VIT) tablet 1 tablet  1 tablet Oral Daily Adefeso, Oladapo, DO   1 tablet at 08/20/23 0454   prochlorperazine  (COMPAZINE ) injection 10 mg  10 mg Intravenous Q6H PRN Adefeso, Oladapo, DO   10 mg at 08/20/23 1507   rosuvastatin  (CRESTOR ) tablet 10 mg  10 mg Oral Daily Adefeso, Oladapo, DO   10 mg at 08/20/23 0981   sodium chloride  flush (NS) 0.9 % injection 10-40 mL  10-40 mL Intracatheter Q12H Melodie Spry, MD   10 mL at 08/20/23 2228   sodium chloride  flush (NS)  0.9 % injection 10-40 mL  10-40 mL Intracatheter PRN Melodie Spry, MD         ALLERGIES Amlodipine , Aspirin , Nitrofurantoin, Ranexa  [ranolazine ], Bactrim [sulfamethoxazole-trimethoprim], Iodinated contrast media, Iron , Tylenol  [acetaminophen ], Gabapentin , Sucroferric oxyhydroxide, Levaquin  [levofloxacin  in d5w], Levofloxacin , Plavix  [clopidogrel  bisulfate], Protonix  [pantoprazole  sodium], and Venofer [ferric oxide]  MEDICAL HISTORY Past Medical History:  Diagnosis Date   Acute on chronic respiratory failure with hypoxia (HCC) 10/10/2016   Anxiety    Arthritis    AVM (arteriovenous malformation) of colon    CAD (coronary artery disease)    a. s/p CABG in 2013 b. DES to D1 in 10/2016. c. cath in 07/2018 showing patent grafts with occlusion of D1 at prior stent site and progression of PDA disease --> medical management recommended   Carotid artery disease (HCC)    a. 60-79% LICA, 03/2012    Chronic bronchitis (HCC)    Chronic HFrEF (heart failure with reduced ejection fraction) (HCC)    Colon cancer (HCC) 1992   Esophageal stricture    ESRD on hemodialysis (HCC)    ESRD due to HTN, started dialysis 2011 and gets HD at [facility] with Dr Lieutenant Reese on MWF schedule.  Access is LUA AVF as of Sept 2014.    GERD (gastroesophageal reflux disease)    High cholesterol 12/2011   History of blood transfusion 07/2011; 12/2011; 01/2012 X 2; 04/2012   History of gout    History of lower GI bleeding    Hypertension    Iron  deficiency anemia    Jugular vein occlusion, right (HCC)    Mitral regurgitation    a. Moderate by echo, 02/2012   Mitral valve disease    NSVT (nonsustained ventricular tachycardia) (HCC)    Ovarian cancer (HCC) 1992   PAF (paroxysmal atrial fibrillation) (HCC)    Pneumonia ~ 2009   PUD (peptic ulcer disease)    TIA (transient ischemic attack)    Tricuspid valve disease      SOCIAL HISTORY Social History   Socioeconomic History   Marital status: Married    Spouse name:  Davanee Coverstone   Number of children: 1   Years of education: Not on file   Highest education level: GED or equivalent  Occupational History   Occupation: retired- Architectural technologist- sewed  Tobacco Use   Smoking status: Never   Smokeless tobacco: Never  Vaping Use   Vaping status: Never Used  Substance and Sexual Activity   Alcohol  use: No    Alcohol /week: 0.0 standard drinks of alcohol    Drug use: No   Sexual activity: Not Currently    Birth control/protection: Surgical  Other Topics Concern   Not on file  Social History Narrative   Lives in [place], [place] with husband.  Dialysis pt - mwf.  Social Drivers of Corporate investment banker Strain: Low Risk  (12/14/2018)   Overall Financial Resource Strain (CARDIA)    Difficulty of Paying Living Expenses: Not very hard  Food Insecurity: No Food Insecurity (08/20/2023)   Hunger Vital Sign    Worried About Running Out of Food in the Last Year: Never true    Ran Out of Food in the Last Year: Never true  Transportation Needs: No Transportation Needs (08/21/2023)   PRAPARE - Administrator, Civil Service (Medical): No    Lack of Transportation (Non-Medical): No  Physical Activity: Unknown (12/14/2018)   Exercise Vital Sign    Days of Exercise per Week: Patient declined    Minutes of Exercise per Session: Patient declined  Stress: No Stress Concern Present (12/14/2018)   [employer] of Occupational Health - Occupational Stress Questionnaire    Feeling of Stress : Only a little  Social Connections: Unknown (08/21/2023)   Social Connection and Isolation Panel [NHANES]    Frequency of Communication with Friends and Family: Patient declined    Frequency of Social Gatherings with Friends and Family: Patient declined    Attends Religious Services: Patient declined    Database administrator or Organizations: Patient declined    Attends Banker Meetings: Patient declined    Marital Status: Married  Careers information officer Violence: Not At Risk (08/21/2023)   Humiliation, Afraid, Rape, and Kick questionnaire    Fear of Current or Ex-Partner: No    Emotionally Abused: No    Physically Abused: No    Sexually Abused: No     FAMILY HISTORY Family History  Problem Relation Age of Onset   Heart disease Mother        Heart Disease before age 64   Hyperlipidemia Mother    Hypertension Mother    Diabetes Mother    Heart attack Mother    Heart disease Father        Heart Disease before age 76   Hyperlipidemia Father    Hypertension Father    Diabetes Father    Diabetes Sister    Hypertension Sister    Diabetes Brother    Hyperlipidemia Brother    Heart attack Brother    Hypertension Sister    Heart attack Brother    Colon cancer Child 49   Other Other        noncontributory for early CAD   Esophageal cancer Neg Hx    Liver disease Neg Hx    Kidney disease Neg Hx    Colon polyps Neg Hx      Review of Systems: 12 systems were reviewed and negative except per HPI  Physical Exam: Vitals:   08/21/23 0015 08/21/23 0413  BP: (!) 137/54 (!) 140/51  Pulse:  72  Resp:  (!) 22  Temp: 98 F (36.7 C) (!) 101.5 F (38.6 C)  SpO2:  100%   No intake/output data recorded.  Intake/Output Summary (Last 24 hours) at 08/21/2023 0810 Last data filed at 08/21/2023 0102 Gross per 24 hour  Intake 219.83 ml  Output 801 ml  Net -581.17 ml   General: no acute distress, comfortable appearing HEENT: anicteric sclera, MMM CV: normal rate, no murmurs, no edema b/l LEs Lungs: normal wob, bibasilar rales Abd: soft, non-tender, non-distended Skin: no visible lesions or rashes Neuro: normal speech, no gross focal deficits  Dialysis access: left [facility] c/d/i  Test Results Reviewed Lab Results  Component Value Date   NA 136  08/20/2023   K 3.4 (L) 08/20/2023   CL 97 (L) 08/20/2023   CO2 25 08/20/2023   BUN 26 (H) 08/20/2023   CREATININE 3.78 (H) 08/20/2023   CALCIUM  8.9 08/20/2023   ALBUMIN  2.9 (L)  08/20/2023   PHOS 3.2 08/20/2023    I have reviewed relevant outside healthcare records

## 2023-08-21 NOTE — Progress Notes (Signed)
 Could Not do echo as dailysis nurse is in room preparing patient for her dialysis.           Denese Finn, RCS

## 2023-08-21 NOTE — Procedures (Signed)
 HD Note:  Some information was entered later than the data was gathered due to patient care needs. The stated time with the data is accurate. Patient treatment done at bedside.  Alert and oriented.  Husband present to sign the blood consent form  Informed consent signed and in chart.   Access used: upper left chest HD catheter Access issues: None  1 unit of PRB was administered during treatment.  Patient tolerated treatment well.   TX duration: 3.5 hours  Alert, without acute distress.  Total UF removed: 3035 ml, 3400 removed minus 365 ml for blood/saline administration  Hand-off given to patient's nurse.     Ryan Palermo L. Alva Jewels, RN Kidney Dialysis Unit.

## 2023-08-22 ENCOUNTER — Other Ambulatory Visit (HOSPITAL_COMMUNITY): Payer: Self-pay

## 2023-08-22 ENCOUNTER — Telehealth (HOSPITAL_COMMUNITY): Payer: Self-pay | Admitting: Pharmacy Technician

## 2023-08-22 ENCOUNTER — Inpatient Hospital Stay (HOSPITAL_COMMUNITY): Payer: Medicare Other

## 2023-08-22 ENCOUNTER — Other Ambulatory Visit (HOSPITAL_COMMUNITY): Payer: Self-pay | Admitting: *Deleted

## 2023-08-22 DIAGNOSIS — R079 Chest pain, unspecified: Secondary | ICD-10-CM

## 2023-08-22 DIAGNOSIS — R7989 Other specified abnormal findings of blood chemistry: Secondary | ICD-10-CM | POA: Diagnosis not present

## 2023-08-22 DIAGNOSIS — J189 Pneumonia, unspecified organism: Secondary | ICD-10-CM | POA: Diagnosis not present

## 2023-08-22 LAB — ECHOCARDIOGRAM COMPLETE
Area-P 1/2: 3.99 cm2
Height: 61 in
MV M vel: 5.67 m/s
MV Peak grad: 128.6 mm[Hg]
Radius: 0.5 cm
S' Lateral: 3 cm
Weight: 1682.55 [oz_av]

## 2023-08-22 LAB — CLOSTRIDIUM DIFFICILE BY PCR, REFLEXED: Toxigenic C. Difficile by PCR: NEGATIVE

## 2023-08-22 LAB — C DIFFICILE QUICK SCREEN W PCR REFLEX
C Diff antigen: POSITIVE — AB
C Diff toxin: NEGATIVE

## 2023-08-22 MED ORDER — VANCOMYCIN HCL 125 MG PO CAPS
125.0000 mg | ORAL_CAPSULE | Freq: Four times a day (QID) | ORAL | Status: DC
  Administered 2023-08-22 – 2023-08-23 (×4): 125 mg via ORAL
  Filled 2023-08-22 (×4): qty 1

## 2023-08-22 NOTE — Progress Notes (Signed)
SATURATION QUALIFICATIONS: (This note is used to comply with regulatory documentation for home oxygen)  Patient Saturations on Room Air at Rest = 94%  Patient Saturations on Room Air while Ambulating = 92%   Please briefly explain why patient needs home oxygen:

## 2023-08-22 NOTE — Plan of Care (Signed)
  Problem: Education: Goal: Knowledge of General Education information will improve Description: Including pain rating scale, medication(s)/side effects and non-pharmacologic comfort measures Outcome: Progressing   Problem: Nutrition: Goal: Adequate nutrition will be maintained Outcome: Progressing   Problem: Coping: Goal: Level of anxiety will decrease Outcome: Progressing   Problem: Respiratory: Goal: Ability to maintain adequate ventilation will improve Outcome: Progressing   Problem: Elimination: Goal: Will not experience complications related to bowel motility Outcome: Not Progressing

## 2023-08-22 NOTE — TOC Transition Note (Signed)
Transition of Care [facility]) - Discharge Note   Patient Details  Name: Shawna [name] MRN: [id number] Date of Birth: Aug 14, 1940  Transition of [facility]) CM/SW Contact:  Villa Herb, LCSWA Phone Number: 08/22/2023, 11:12 AM  Clinical Narrative:    CSW updated pt will D/C home today. CSW updated Maralyn Sago with [employer] who states that they will continue to follow pt in the community. MD placed HH orders. TOC signing off.   Final next level of care: Home w Home Health Services Barriers to Discharge: Barriers Resolved   Patient Goals and CMS Choice Patient states their goals for this hospitalization and ongoing recovery are:: return home CMS Medicare.gov Compare Post Acute Care list provided to:: Patient Choice offered to / list presented to : Patient      Discharge Placement                       Discharge Plan and Services Additional resources added to the After Visit Summary for   In-house Referral: Clinical Social Work   Post Acute Care Choice: Home Health                    HH Arranged: PT [facility] Agency: [facility] Date [facility] Agency Contacted: 08/22/23   Representative spoke with at [facility] Agency: Maralyn Sago  Social Drivers of Health (SDOH) Interventions SDOH Screenings   Food Insecurity: No Food Insecurity (08/20/2023)  Housing: Unknown (08/20/2023)  Transportation Needs: No Transportation Needs (08/21/2023)  Utilities: Not At Risk (08/21/2023)  Financial Resource Strain: Low Risk  (12/14/2018)  Physical Activity: Unknown (12/14/2018)  Social Connections: Unknown (08/21/2023)  Stress: No Stress Concern Present (12/14/2018)  Tobacco Use: Low Risk  (08/19/2023)  Health Literacy: Adequate Health Literacy (08/01/2023)   Received from [facility]  Recent Concern: Health Literacy - Inadequate Health Literacy (07/20/2023)   Received from [facility]     Readmission Risk Interventions    08/20/2023    2:28 PM 05/02/2023    2:17 PM 01/29/2023   11:08 AM   Readmission Risk Prevention Plan  Transportation Screening Complete Complete Complete  Medication Review Oceanographer) Complete Complete Complete  PCP or Specialist appointment within 3-5 days of discharge Complete  Complete  HRI or Home Care Consult Complete Complete Complete  SW Recovery Care/Counseling Consult Complete Complete Complete  Palliative Care Screening  Not Applicable Not Applicable  Skilled Nursing Facility  Not Applicable Not Applicable

## 2023-08-22 NOTE — Progress Notes (Signed)
*  PRELIMINARY RESULTS* Echocardiogram 2D Echocardiogram has been performed.  Stacey Drain 08/22/2023, 1:51 PM

## 2023-08-22 NOTE — Progress Notes (Signed)
PROGRESS NOTE   Shawna [name]  ZOX:[id number] DOB: [birth year] DOA: 08/19/2023 PCP: [facility]   Brief Narrative:  HPI: Shawna [name] is a 83 y.o. female with medical history significant of ESRD on hemodialysis (MWF), CAD status post CABG, HFrEF, T2DM, hypertension, GERD, atrial fibrillation (not on anticoagulants) who presents to the emergency department with complaint of cough which started today.  She complained of 2 to 3-day onset of not feeling well and complained of body aches, productive cough (unsure of sputum color), subjective fever.  Shortness of breath worsened today, so she presented to the ED for further evaluation and management.   ED Course: In the emergency department, she was tachypneic, tachycardic, BP was 178/80, O2 sat was 89% on room air, this improved to 100% on supplemental oxygen via [place] at 2 LPM.  Temperature 98.6 F.  Workup in the ED showed hemoglobin 8.1, hematocrit 25.3, MCV 114.5.  BMP was normal except for chloride 97, blood glucose 185, creatinine 3.36, GFR 13.  Lactic acid 1.8 > 2.0.  Troponin 89 >129.  Influenza A, B, SARS, respiratory, RSV was negative. CT chest without contrast showed bibasilar airspace disease and bronchial wall thickening consistent with multifocal bronchopneumonia.  Small free-flowing bilateral pleural effusions, right greater than left. Chest x-ray showed vascular congestion with persistent bibasilar airspace disease and small bilateral pleural effusions.  Overall findings are consistent with pulmonary edema, and/or underlying infection will be difficult to exclude.  No significant change since prior exam Patient was started on IV ceftriaxone and azithromycin, IV hydralazine 10 mg x 1 was given due to elevated BP and breathing treatment was provided. Hospitalist was asked admit patient for further evaluation and management.   Of note, patient was recently hospitalized for 3 days for bradycardia which was asymptomatic, patient's  metoprolol and amiodarone was discontinued at discharge on 08/08/2023 per cardiology recommendations.  Assessment & Plan:   Principal Problem:   Multifocal pneumonia Active Problems:   PAF (paroxysmal atrial fibrillation) (HCC)   Diabetes mellitus type 2 in nonobese [facility])   Acquired hypothyroidism   Mixed hyperlipidemia   Chronic combined systolic and diastolic CHF (congestive heart failure) (HCC)   Hypertensive urgency   Elevated troponin   Acute respiratory failure with hypoxia (HCC)   Pulmonary edema   Severe sepsis (HCC)   Bilateral pleural effusion  1)Severe sepsis and acute hypoxic respiratory failure secondary to multifocal/healthcare associated pneumonia, POA:  PCT 0.44, no leukocytosis -Continue Rocephin and doxycycline -Azithromycin discontinued due to concerns about QT prolongation -No leukocytosis, no fevers  2)Diarrhea: -worsening diarrhea ---started oral vancomycin (C diff antigen +ve, C diff Toxin is Negative < C diff PCR is pending ) --No fevers   3)Acute on chronic combined systolic and diastolic congestive heart failure/volume overload bilateral pleural effusion/acute pulmonary edema/ESRD on HD, POA: -admission Chest x-ray was consistent with pulmonary edema -Patient had elevated troponin in the setting of CHF/demand ischemia and ESRD ----troponin pattern is Not consistent with ACS -Continue to use hemodialysis to address volume status -Echocardiogram from 08/22/23  with EF of 45 to 50% with grade 2 diastolic dysfunction  4) acute on chronic symptomatic anemia-- Hgb down to 6.2 -Patient with underlying chronic anemia of ESRD compounded by history of GI bleed as well -Discussed with Dr. Thedore Mins from nephrology service -Patient is to be transfused during hemodialysis session---will try to give 2 units of PRBC on 08/21/2023 -Defer timing and dosing of ESA agent/Procrit to nephrology team  5)Prolonged QT interval/hypomagnesemia QTc 506 ms  Avoid QT prolonging  drugs.  Magnesium is low, will replenish. -Discontinued azithromycin   6)Hypertensive urgency-resolved, continue home dose of hydralazine.   7)Paroxysmal atrial fibrillation: -No anticoagulation due to history of GI bleed and anemia of ESRD with symptomatic anemia requiring transfusion -Beta-blockers and amiodarone were discontinued by Dr. Jacqulyn Bath during recent hospitalization due to bradycardia -Patient has become more tachycardic, so Dr Jacqulyn Bath this admission restarted metoprolol -With hold parameters  8) acute hypoxic respiratory failure--multifactorial due to #1, #2 #3 above -- Currently requiring 2 L of oxygen via nasal cannula -Anticipate improvement after hemodialysis session  9)ESRD---c/n HD on MWF, last HD on 08/21/23  Mixed hyperlipidemia Continue Crestor.   Acquired hypothyroidism: Continue Synthroid.  DVT prophylaxis: enoxaparin (LOVENOX) injection 30 mg Start: 08/20/23 1000 SCDs Start: 08/20/23 0022   Code Status: Full Code  Family Communication:  None present at bedside.    Status is: Inpatient Remains inpatient appropriate because: Patient very sick and requires inpatient care as mentioned above.   Estimated body mass index is 19.49 kg/m as calculated from the following:   Height as of this encounter: 5\' 1"  (1.549 m).   Weight as of this encounter: 46.8 kg.    Nutritional Assessment: Body mass index is 19.49 kg/m.Marland Kitchen Seen by dietician.  I agree with the assessment and plan as outlined below: Nutrition Status:      Consultants:  Nephrology  Procedures:  HD  Antimicrobials:  Anti-infectives (From admission, onward)    Start     Dose/Rate Route Frequency Ordered Stop   08/22/23 1500  vancomycin (VANCOCIN) capsule 125 mg        125 mg Oral Every 6 hours 08/22/23 1444     08/21/23 1000  doxycycline (VIBRAMYCIN) 100 mg in sodium chloride 0.9 % 250 mL IVPB        100 mg 125 mL/hr over 120 Minutes Intravenous Every 12 hours 08/21/23 0855     08/20/23  1000  cefTRIAXone (ROCEPHIN) 2 g in sodium chloride 0.9 % 100 mL IVPB        2 g 200 mL/hr over 30 Minutes Intravenous Every 24 hours 08/20/23 0026 08/24/23 0959   08/20/23 1000  azithromycin (ZITHROMAX) 500 mg in sodium chloride 0.9 % 250 mL IVPB  Status:  Discontinued        500 mg 250 mL/hr over 60 Minutes Intravenous Every 24 hours 08/20/23 0026 08/21/23 0855   08/19/23 2300  cefTRIAXone (ROCEPHIN) 1 g in sodium chloride 0.9 % 100 mL IVPB        1 g 200 mL/hr over 30 Minutes Intravenous  Once 08/19/23 2251 08/19/23 2344   08/19/23 2300  azithromycin (ZITHROMAX) 500 mg in sodium chloride 0.9 % 250 mL IVPB        500 mg 250 mL/hr over 60 Minutes Intravenous  Once 08/19/23 2251 08/20/23 0114         Subjective: - -dyspnea improving No fever  Or chills  Diarrhea persist No emesis  Objective: Vitals:   08/22/23 0315 08/22/23 0700 08/22/23 0800 08/22/23 1500  BP: (!) 139/50   (!) 169/65  Pulse: 65     Resp: 20  15   Temp: 98.6 F (37 C)   98.6 F (37 C)  TempSrc: Oral   Oral  SpO2: 92%   92%  Weight:  46.8 kg    Height:        Intake/Output Summary (Last 24 hours) at 08/22/2023 1912 Last data filed at 08/22/2023 0981 Gross per 24 hour  Intake 1576 ml  Output 3400 ml  Net -1824 ml   Filed Weights   08/21/23 0458 08/21/23 1701 08/22/23 0700  Weight: 47.7 kg 50.9 kg 46.8 kg     Physical Exam  Gen:- Awake Alert, in no acute distress, no conversational dyspnea HEENT:- [place].AT, No sclera icterus Nose- Hundred 2L/min Neck-Supple Neck, +ve JVD,.  Left IJ hemodialysis catheter Lungs-improving air movement, No wheezing  CV- S1, S2 normal, irregular  abd-  +ve B.Sounds, Abd Soft, No tenderness,    Extremity/Skin:- No  edema,   good pedal pulses  Psych-affect is appropriate, oriented x3 Neuro-generalized weakness, no new focal deficits, no tremors   Data Reviewed: I have personally reviewed following labs and imaging studies  CBC: Recent Labs  Lab 08/19/23 2056  08/20/23 0407 08/21/23 1713  WBC 6.8 7.0 4.5  HGB 8.1* 7.1* 6.2*  HCT 25.3* 22.3* 19.5*  MCV 114.5* 116.1* 118.2*  PLT 230 193 177   Basic Metabolic Panel: Recent Labs  Lab 08/19/23 2056 08/20/23 0407 08/21/23 1713  NA 138 136 135  K 3.5 3.4* 3.9  CL 97* 97* 97*  CO2 29 25 26   GLUCOSE 185* 163* 107*  BUN 22 26* 29*  CREATININE 3.36* 3.78* 4.37*  CALCIUM 9.2 8.9 8.7*  MG  --  1.4*  --   PHOS  --  3.2 4.5   GFR: Estimated Creatinine Clearance: 7.2 mL/min (A) (by C-G formula based on SCr of 4.37 mg/dL (H)). Liver Function Tests: Recent Labs  Lab 08/20/23 0407 08/21/23 1713  AST 34  --   ALT 19  --   ALKPHOS 140*  --   BILITOT 1.0  --   PROT 6.6  --   ALBUMIN 2.9* 3.0*   Coagulation Profile: Recent Labs  Lab 08/19/23 2102  INR 1.0   Sepsis Labs: Recent Labs  Lab 08/19/23 2102 08/19/23 2247 08/20/23 0145 08/20/23 0407  PROCALCITON  --   --   --  0.44  LATICACIDVEN 1.8 2.0* 2.2* 2.4*    Recent Results (from the past 240 hours)  MRSA Next Gen by PCR, Nasal     Status: None   Collection Time: 08/19/23 12:20 AM   Specimen: Nasal Mucosa; Nasal Swab  Result Value Ref Range Status   MRSA by PCR Next Gen NOT DETECTED NOT DETECTED Final    Comment: (NOTE) The GeneXpert MRSA Assay (FDA approved for NASAL specimens only), is one component of a comprehensive MRSA colonization surveillance program. It is not intended to diagnose MRSA infection nor to guide or monitor treatment for MRSA infections. Test performance is not FDA approved in patients less than 41 years old. Performed at [facility], [address] [place], [place] [address]   Culture, blood (Routine x 2)     Status: None (Preliminary result)   Collection Time: 08/19/23  9:02 PM   Specimen: BLOOD  Result Value Ref Range Status   Specimen Description BLOOD  Final   Special Requests NONE  Final   Culture   Final    NO GROWTH 3 DAYS Performed at [facility], [address], [place], [place]  [address]    Report Status PENDING  Incomplete  Resp panel by RT-PCR (RSV, Flu A&B, Covid) Anterior Nasal Swab     Status: None   Collection Time: 08/19/23  9:06 PM   Specimen: Anterior Nasal Swab  Result Value Ref Range Status   SARS Coronavirus 2 by RT PCR NEGATIVE NEGATIVE Final    Comment: (NOTE) SARS-CoV-2  target nucleic acids are NOT DETECTED.  The SARS-CoV-2 RNA is generally detectable in upper respiratory specimens during the acute phase of infection. The lowest concentration of SARS-CoV-2 viral copies this assay can detect is 138 copies/mL. A negative result does not preclude SARS-Cov-2 infection and should not be used as the sole basis for treatment or other patient management decisions. A negative result may occur with  improper specimen collection/handling, submission of specimen other than nasopharyngeal swab, presence of viral mutation(s) within the areas targeted by this assay, and inadequate number of viral copies(<138 copies/mL). A negative result must be combined with clinical observations, patient history, and epidemiological information. The expected result is Negative.  Fact Sheet for Patients:  BloggerCourse.com  Fact Sheet for Healthcare Providers:  SeriousBroker.it  This test is no t yet approved or cleared by the [place] FDA and  has been authorized for detection and/or diagnosis of SARS-CoV-2 by FDA under an Emergency Use Authorization (EUA). This EUA will remain  in effect (meaning this test can be used) for the duration of the COVID-19 declaration under Section 564(b)(1) of the Act, 21 U.S.C.section 360bbb-3(b)(1), unless the authorization is terminated  or revoked sooner.       Influenza A by PCR NEGATIVE NEGATIVE Final   Influenza B by PCR NEGATIVE NEGATIVE Final    Comment: (NOTE) The Xpert Xpress SARS-CoV-2/FLU/RSV plus assay is intended as an aid in the diagnosis of influenza from  Nasopharyngeal swab specimens and should not be used as a sole basis for treatment. Nasal washings and aspirates are unacceptable for Xpert Xpress SARS-CoV-2/FLU/RSV testing.  Fact Sheet for Patients: BloggerCourse.com  Fact Sheet for Healthcare Providers: SeriousBroker.it  This test is not yet approved or cleared by the [place] FDA and has been authorized for detection and/or diagnosis of SARS-CoV-2 by FDA under an Emergency Use Authorization (EUA). This EUA will remain in effect (meaning this test can be used) for the duration of the COVID-19 declaration under Section 564(b)(1) of the Act, 21 U.S.C. section 360bbb-3(b)(1), unless the authorization is terminated or revoked.     Resp Syncytial Virus by PCR NEGATIVE NEGATIVE Final    Comment: (NOTE) Fact Sheet for Patients: BloggerCourse.com  Fact Sheet for Healthcare Providers: SeriousBroker.it  This test is not yet approved or cleared by the [place] FDA and has been authorized for detection and/or diagnosis of SARS-CoV-2 by FDA under an Emergency Use Authorization (EUA). This EUA will remain in effect (meaning this test can be used) for the duration of the COVID-19 declaration under Section 564(b)(1) of the Act, 21 U.S.C. section 360bbb-3(b)(1), unless the authorization is terminated or revoked.  Performed at [facility], [address] [name], [place] [address]   Culture, blood (Routine x 2)     Status: None (Preliminary result)   Collection Time: 08/19/23 10:47 PM   Specimen: BLOOD RIGHT HAND  Result Value Ref Range Status   Specimen Description BLOOD RIGHT HAND  Final   Special Requests NONE  Final   Culture   Final    NO GROWTH 3 DAYS Performed at [facility], [address], [place], [place] [address]    Report Status PENDING  Incomplete  C Difficile Quick Screen w PCR reflex     Status: Abnormal    Collection Time: 08/20/23 10:17 AM   Specimen: STOOL  Result Value Ref Range Status   C Diff antigen POSITIVE (A) NEGATIVE Final   C Diff toxin NEGATIVE NEGATIVE Final   C Diff interpretation Results are  indeterminate. See PCR results.  Final    Comment: Performed at [facility], [address], [place], [place] [address]  C. Diff by PCR, Reflexed     Status: None   Collection Time: 08/20/23 10:17 AM  Result Value Ref Range Status   Toxigenic C. Difficile by PCR NEGATIVE NEGATIVE Final    Comment: Patient is colonized with non toxigenic C. difficile. May not need treatment unless significant symptoms are present. Performed at [facility], [address] [place], [place] [address]      Radiology Studies: ECHOCARDIOGRAM COMPLETE Result Date: 08/22/2023    ECHOCARDIOGRAM REPORT   Patient Name:   Shawna [name] Date of Exam: 08/22/2023 Medical Rec #:  [id number]    Height:       61.0 in Accession #:    [id number]   Weight:       103.2 lb Date of Birth:  05-Jun-1941    BSA:          1.426 m Patient Age:    82 years     BP:           140/51 mmHg Patient Gender: F            HR:           59 bpm. [facility]am [facility] Procedure: 2D Echo, Color Doppler and Cardiac Doppler Indications:    Elevated Troponin  History:        Patient has prior history of Echocardiogram examinations, most                 recent 07/16/2021. CAD and Previous Myocardial Infarction, Prior                 CABG, Arrythmias:LBBB and Bradycardia; Risk                 Factors:Hypertension, Diabetes and Dyslipidemia. ESRD on                 hemodialysis.  Sonographer:    Celesta Gentile RCS Referring Phys: [id number] RAVI PAHWANI IMPRESSIONS  1. Left ventricular ejection fraction, by estimation, is 45 to 50%. The left ventricle has mildly decreased function. The left ventricle demonstrates regional wall motion abnormalities (see scoring diagram/findings for description). Left ventricular diastolic parameters are consistent with Grade II  diastolic dysfunction (pseudonormalization). Elevated left atrial pressure.  2. Right ventricular systolic function is moderately reduced. The right ventricular size is normal. There is moderately elevated pulmonary artery systolic pressure.  3. Left atrial size was moderately dilated.  4. The mitral valve is normal in structure. Mild mitral valve regurgitation. No evidence of mitral stenosis.  5. The aortic valve is tricuspid. There is moderate calcification of the aortic valve. Aortic valve regurgitation is not visualized. No aortic stenosis is present.  6. The inferior vena cava is normal in size with greater than 50% respiratory variability, suggesting right atrial pressure of 3 mmHg. Comparison(s): Changes from prior study are noted. LVEF improved from 30-35% in 2023 to 45-50% now. FINDINGS  Left Ventricle: Left ventricular ejection fraction, by estimation, is 45 to 50%. The left ventricle has mildly decreased function. The left ventricle demonstrates regional wall motion abnormalities. The left ventricular internal cavity size was normal in size. There is no left ventricular hypertrophy. Left ventricular diastolic parameters are consistent with Grade II diastolic dysfunction (pseudonormalization). Elevated left atrial pressure.  LV Wall Scoring: The anterior septum is akinetic. The antero-lateral wall, inferior septum, and apical lateral  segment are hypokinetic. The entire anterior wall, entire inferior wall, posterior wall, and apex are normal. Right Ventricle: The right ventricular size is normal. No increase in right ventricular wall thickness. Right ventricular systolic function is moderately reduced. There is moderately elevated pulmonary artery systolic pressure. The tricuspid regurgitant velocity is 3.28 m/s, and with an assumed right atrial pressure of 15 mmHg, the estimated right ventricular systolic pressure is 58.0 mmHg. Left Atrium: Left atrial size was moderately dilated. Right Atrium: Right  atrial size was normal in size. Pericardium: There is no evidence of pericardial effusion. Mitral Valve: The mitral valve is normal in structure. Mild mitral valve regurgitation. No evidence of mitral valve stenosis. Tricuspid Valve: The tricuspid valve is normal in structure. Tricuspid valve regurgitation is mild . No evidence of tricuspid stenosis. Aortic Valve: The aortic valve is tricuspid. There is moderate calcification of the aortic valve. Aortic valve regurgitation is not visualized. No aortic stenosis is present. Pulmonic Valve: The pulmonic valve was not well visualized. Pulmonic valve regurgitation is trivial. No evidence of pulmonic stenosis. Aorta: The aortic root is normal in size and structure. Venous: The inferior vena cava is normal in size with greater than 50% respiratory variability, suggesting right atrial pressure of 3 mmHg. IAS/Shunts: No atrial level shunt detected by color flow Doppler.  LEFT VENTRICLE PLAX 2D LVIDd:         3.70 cm   Diastology LVIDs:         3.00 cm   LV e' medial:    2.70 cm/s LV PW:         1.60 cm   LV E/e' medial:  56.3 LV IVS:        1.50 cm   LV e' lateral:   4.88 cm/s LVOT diam:     1.70 cm   LV E/e' lateral: 31.1 LV SV:         35 LV SV Index:   25 LVOT Area:     2.27 cm  RIGHT VENTRICLE RV S prime:     5.18 cm/s TAPSE (M-mode): 1.3 cm LEFT ATRIUM             Index        RIGHT ATRIUM           Index LA diam:        4.10 cm 2.88 cm/m   RA Area:     13.90 cm LA Vol (A2C):   74.4 ml 52.19 ml/m  RA Volume:   34.30 ml  24.06 ml/m LA Vol (A4C):   63.5 ml 44.54 ml/m LA Biplane Vol: 68.8 ml 48.26 ml/m  AORTIC VALVE LVOT Vmax:   75.40 cm/s LVOT Vmean:  47.900 cm/s LVOT VTI:    0.156 m  AORTA Ao Root diam: 2.70 cm MITRAL VALVE                  TRICUSPID VALVE MV Area (PHT): 3.99 cm       TR Peak grad:   43.0 mmHg MV Decel Time: 190 msec       TR Vmax:        328.00 cm/s MR Peak grad:    128.6 mmHg MR Mean grad:    83.0 mmHg    SHUNTS MR Vmax:         567.00 cm/s   Systemic VTI:  0.16 m MR Vmean:        431.0 cm/s   Systemic Diam: 1.70 cm MR PISA:  1.57 cm MR PISA Eff ROA: 8 mm MR PISA Radius:  0.50 cm MV E velocity: 152.00 cm/s MV A velocity: 92.00 cm/s MV E/A ratio:  1.65 Vishnu Priya Mallipeddi Electronically signed by Winfield Rast Mallipeddi Signature Date/Time: 08/22/2023/5:11:00 PM    Final    Scheduled Meds:  sodium chloride   Intravenous Once   calcitRIOL  0.75 mcg Oral Q M,W,F-HD   Chlorhexidine Gluconate Cloth  6 each Topical Q0600   Chlorhexidine Gluconate Cloth  6 each Topical Q0600   cinacalcet  30 mg Oral Q M,W,F-HD   darbepoetin (ARANESP) injection - DIALYSIS  150 mcg Subcutaneous Q Mon-1800   dextromethorphan-guaiFENesin  1 tablet Oral BID   enoxaparin (LOVENOX) injection  30 mg Subcutaneous Q24H   hydrALAZINE  25 mg Oral BID   levothyroxine  125 mcg Oral Q0600   metoprolol tartrate  25 mg Oral BID   multivitamin  1 tablet Oral Daily   rosuvastatin  10 mg Oral Daily   sodium chloride flush  10-40 mL Intracatheter Q12H   vancomycin  125 mg Oral Q6H   Continuous Infusions:  cefTRIAXone (ROCEPHIN)  IV 2 g (08/22/23 1008)   doxycycline (VIBRAMYCIN) IV 100 mg (08/22/23 1050)     LOS: 3 days   Shon Hale, MD [facility]  08/22/2023, 7:12 PM   How to contact the [facility] Attending or Consulting provider 7A - 7P or covering provider during after hours 7P -7A, for this patient?

## 2023-08-22 NOTE — Plan of Care (Signed)
Problem: Education: Goal: Knowledge of General Education information will improve Description: Including pain rating scale, medication(s)/side effects and non-pharmacologic comfort measures Outcome: Progressing   Problem: Health Behavior/Discharge Planning: Goal: Ability to manage health-related needs will improve Outcome: Progressing   Problem: Clinical Measurements: Goal: Ability to maintain clinical measurements within normal limits will improve Outcome: Progressing Goal: Will remain free from infection Outcome: Progressing Goal: Diagnostic test results will improve Outcome: Progressing

## 2023-08-22 NOTE — Telephone Encounter (Signed)
Patient Product/process development scientist completed.    The patient is insured through [facility]. Patient has Medicare and is not eligible for a copay card, but may be able to apply for patient assistance or Medicare RX Payment Plan (Patient Must reach out to their plan, if eligible for payment plan), if available.    Ran test claim for vancomycin 125 mg and the current 10 day co-pay is $108.96 due to a deductible.  Ran test claim for Dificid 200 mg and the current 10 day co-pay is $1,756.80 due to a deductible.   This test claim was processed through [facility]- copay amounts may vary at other pharmacies due to pharmacy/plan contracts, or as the patient moves through the different stages of their insurance plan.     Roland Earl, CPHT Pharmacy Technician III Certified Patient Advocate [facility] Patient Advocate Team Direct Number: [phone]  Fax: [phone]

## 2023-08-22 NOTE — Progress Notes (Signed)
Patient in bathroom.      Celesta Gentile, RCS

## 2023-08-22 NOTE — Progress Notes (Addendum)
Nephrology Follow-Up Consult note   Assessment/Recommendations: Shawna [name] is a/an 83 y.o. female with a past medical history significant for ESRD, CAD, DM 2, HTN, admitted for pneumonia    ESRD  -outpatient HD orders: [name].  4 hours.  EDW 51 kg.  TDC.  Nepro 17 H Elisio.  Flow rates: 300/500.  2K/2.5 calcium.  Heparin: Only heparin locks for TDC.  Meds: Calcitriol 0.75 mcg 3 times a week, ID PN, Mircera 200 mcg every 2 weeks (due 2/10 and given) -continue dialysis on an that EF scheduled   Severe sepsis Multifocal PNA, HCAP -per primary service   Acute on chronic combined systolic and diastolic CHF exacerbation -UF as tolerated with HD   Volume/ hypertension  -UF as tolerated.  Continue home blood pressure medications   Anemia of Chronic Kidney Disease -Hgb dips below 7, due for transfusion per primary service. Resuming ESA-- Aranesp ordered for today -Transfuse PRN for Hgb <7   Secondary Hyperparathyroidism/Hyperphosphatemia - resume home meds, calctriol resumed, monitor phos    Elevated troponin -thought to be secondary to demand ischemia secondary to sepsis. Per primary   Paroxysmal Afib -not on a/c given history of anemia/GIB -per primary   Recommendations conveyed to primary service.    Darnell Level [facility] 08/22/2023 10:46 AM  ___________________________________________________________  CC: shortness of breath  Interval History/Subjective: patient states she starting to feel better.  No issues with dialysis yesterday.  Denies any complaints   Medications:  Current Facility-Administered Medications  Medication Dose Route Frequency Provider Last Rate Last Admin   0.9 %  sodium chloride infusion (Manually program via Guardrails IV Fluids)   Intravenous Once Hughie Closs, MD   Paused at 08/20/23 1236   acetaminophen (TYLENOL) tablet 650 mg  650 mg Oral Q6H PRN Adefeso, Oladapo, DO   650 mg at 08/21/23 0453    calcitRIOL (ROCALTROL) capsule 0.75 mcg  0.75 mcg Oral Q M,W,F-HD Anthony Sar, MD   0.75 mcg at 08/21/23 2039   cefTRIAXone (ROCEPHIN) 2 g in sodium chloride 0.9 % 100 mL IVPB  2 g Intravenous Q24H Adefeso, Oladapo, DO 200 mL/hr at 08/22/23 1008 2 g at 08/22/23 1008   Chlorhexidine Gluconate Cloth 2 % PADS 6 each  6 each Topical Q0600 Adefeso, Oladapo, DO   6 each at 08/22/23 0525   Chlorhexidine Gluconate Cloth 2 % PADS 6 each  6 each Topical Q0600 Dagoberto Ligas, MD       cinacalcet [facility]) tablet 30 mg  30 mg Oral Q M,W,F-HD Adefeso, Oladapo, DO   30 mg at 08/21/23 2040   Darbepoetin Alfa (ARANESP) injection 150 mcg  150 mcg Subcutaneous Q Mon-1800 Anthony Sar, MD   150 mcg at 08/21/23 2040   dextromethorphan-guaiFENesin (MUCINEX DM) 30-600 MG per 12 hr tablet 1 tablet  1 tablet Oral BID Adefeso, Oladapo, DO   1 tablet at 08/22/23 1005   doxycycline (VIBRAMYCIN) 100 mg in sodium chloride 0.9 % 250 mL IVPB  100 mg Intravenous Q12H Shon Hale, MD   Stopped at 08/22/23 0013   enoxaparin (LOVENOX) injection 30 mg  30 mg Subcutaneous Q24H Adefeso, Oladapo, DO   30 mg at 08/22/23 1005   guaiFENesin-dextromethorphan (ROBITUSSIN DM) 100-10 MG/5ML syrup 5 mL  5 mL Oral Q4H PRN Adefeso, Oladapo, DO       heparin injection 1,000 Units  1,000 Units Intracatheter PRN Dagoberto Ligas, MD   3,800 Units at 08/21/23 2059   hydrALAZINE (APRESOLINE) tablet  25 mg  25 mg Oral BID Adefeso, Oladapo, DO   25 mg at 08/22/23 1005   levothyroxine (SYNTHROID) tablet 125 mcg  125 mcg Oral Q0600 Adefeso, Oladapo, DO   125 mcg at 08/22/23 0518   metoprolol tartrate (LOPRESSOR) tablet 25 mg  25 mg Oral BID Hughie Closs, MD   25 mg at 08/22/23 1005   multivitamin (RENA-VIT) tablet 1 tablet  1 tablet Oral Daily Adefeso, Oladapo, DO   1 tablet at 08/22/23 1005   prochlorperazine (COMPAZINE) injection 10 mg  10 mg Intravenous Q6H PRN Adefeso, Oladapo, DO   10 mg at 08/20/23 1507   rosuvastatin (CRESTOR) tablet 10 mg  10 mg  Oral Daily Adefeso, Oladapo, DO   10 mg at 08/22/23 1005   sodium chloride flush (NS) 0.9 % injection 10-40 mL  10-40 mL Intracatheter Q12H Dagoberto Ligas, MD   10 mL at 08/22/23 1006   sodium chloride flush (NS) 0.9 % injection 10-40 mL  10-40 mL Intracatheter PRN Dagoberto Ligas, MD          Review of Systems: 10 systems reviewed and negative except per interval history/subjective  Physical Exam: Vitals:   08/22/23 0315 08/22/23 0800  BP: (!) 139/50   Pulse: 65   Resp: 20 15  Temp: 98.6 F (37 C)   SpO2: 92%    No intake/output data recorded.  Intake/Output Summary (Last 24 hours) at 08/22/2023 1046 Last data filed at 08/22/2023 0454 Gross per 24 hour  Intake 1577 ml  Output 3400 ml  Net -1823 ml   Constitutional: elderly but well-appearing, no acute distress ENMT: ears and nose without scars or lesions, MMM CV: normal rate, no edema Respiratory: bilateral chest rise, normal work of breathing Gastrointestinal: soft, non-tender, no palpable masses or hernias Skin: no visible lesions or rashes Psych: alert, judgement/insight appropriate, appropriate mood and affect   Test Results I personally reviewed new and old clinical labs and radiology tests Lab Results  Component Value Date   NA 135 08/21/2023   K 3.9 08/21/2023   CL 97 (L) 08/21/2023   CO2 26 08/21/2023   BUN 29 (H) 08/21/2023   CREATININE 4.37 (H) 08/21/2023   CALCIUM 8.7 (L) 08/21/2023   ALBUMIN 3.0 (L) 08/21/2023   PHOS 4.5 08/21/2023    CBC Recent Labs  Lab 08/19/23 2056 08/20/23 0407 08/21/23 1713  WBC 6.8 7.0 4.5  HGB 8.1* 7.1* 6.2*  HCT 25.3* 22.3* 19.5*  MCV 114.5* 116.1* 118.2*  PLT 230 193 177

## 2023-08-22 NOTE — Care Management Important Message (Signed)
Important Message  Patient Details  Name: Shawna [name] MRN: [id number] Date of Birth: Aug 12, 1940   Important Message Given:  Yes - Medicare IM     [name] 08/22/2023, 11:52 AM

## 2023-08-23 DIAGNOSIS — Z992 Dependence on renal dialysis: Secondary | ICD-10-CM

## 2023-08-23 DIAGNOSIS — J189 Pneumonia, unspecified organism: Secondary | ICD-10-CM | POA: Diagnosis not present

## 2023-08-23 DIAGNOSIS — I160 Hypertensive urgency: Secondary | ICD-10-CM

## 2023-08-23 DIAGNOSIS — I480 Paroxysmal atrial fibrillation: Secondary | ICD-10-CM | POA: Diagnosis not present

## 2023-08-23 DIAGNOSIS — N186 End stage renal disease: Secondary | ICD-10-CM

## 2023-08-23 LAB — TYPE AND SCREEN
ABO/RH(D): O POS
Antibody Screen: NEGATIVE
Unit division: 0
Unit division: 0
Unit division: 0

## 2023-08-23 LAB — CBC
HCT: 29.7 % — ABNORMAL LOW (ref 36.0–46.0)
Hemoglobin: 9.8 g/dL — ABNORMAL LOW (ref 12.0–15.0)
MCH: 34 pg (ref 26.0–34.0)
MCHC: 33 g/dL (ref 30.0–36.0)
MCV: 103.1 fL — ABNORMAL HIGH (ref 80.0–100.0)
Platelets: 179 10*3/uL (ref 150–400)
RBC: 2.88 MIL/uL — ABNORMAL LOW (ref 3.87–5.11)
RDW: 25.8 % — ABNORMAL HIGH (ref 11.5–15.5)
WBC: 3.3 10*3/uL — ABNORMAL LOW (ref 4.0–10.5)
nRBC: 0 % (ref 0.0–0.2)

## 2023-08-23 LAB — BPAM RBC
Blood Product Expiration Date: 202503072359
Blood Product Expiration Date: 202503112359
ISSUE DATE / TIME: 202502101756
ISSUE DATE / TIME: 202502110038
PRODUCT CODE: 202503112359
Unit Type and Rh: 202503072359
Unit Type and Rh: 5100
Unit Type and Rh: 5100
Unit Type and Rh: 5100

## 2023-08-23 LAB — BASIC METABOLIC PANEL
Anion gap: 13 (ref 5–15)
BUN: 23 mg/dL (ref 8–23)
CO2: 22 mmol/L (ref 22–32)
Calcium: 8.1 mg/dL — ABNORMAL LOW (ref 8.9–10.3)
Chloride: 98 mmol/L (ref 98–111)
Creatinine, Ser: 4.05 mg/dL — ABNORMAL HIGH (ref 0.44–1.00)
GFR, Estimated: 10 mL/min — ABNORMAL LOW (ref >60)
Glucose, Bld: 105 mg/dL — ABNORMAL HIGH (ref 70–99)
Potassium: 3.3 mmol/L — ABNORMAL LOW (ref 3.5–5.1)
Sodium: 133 mmol/L — ABNORMAL LOW (ref 135–145)

## 2023-08-23 MED ORDER — ALUM & MAG HYDROXIDE-SIMETH 200-200-20 MG/5ML PO SUSP
15.0000 mL | ORAL | Status: DC | PRN
  Filled 2023-08-23: qty 30

## 2023-08-23 MED ORDER — CALCITRIOL 0.25 MCG PO CAPS
ORAL_CAPSULE | ORAL | Status: AC
  Filled 2023-08-23: qty 3

## 2023-08-23 MED ORDER — AMOXICILLIN-POT CLAVULANATE 500-125 MG PO TABS
1.0000 | ORAL_TABLET | Freq: Two times a day (BID) | ORAL | Status: DC
  Administered 2023-08-24: 1 via ORAL
  Filled 2023-08-23: qty 1

## 2023-08-23 MED ORDER — SACCHAROMYCES BOULARDII 250 MG PO CAPS
250.0000 mg | ORAL_CAPSULE | Freq: Two times a day (BID) | ORAL | Status: DC
  Administered 2023-08-23 – 2023-08-24 (×3): 250 mg via ORAL
  Filled 2023-08-23 (×3): qty 1

## 2023-08-23 MED ORDER — CINACALCET HCL 30 MG PO TABS
ORAL_TABLET | ORAL | Status: AC
  Filled 2023-08-23: qty 1

## 2023-08-23 MED ORDER — HEPARIN SODIUM (PORCINE) 1000 UNIT/ML IJ SOLN
INTRAMUSCULAR | Status: AC
  Filled 2023-08-23: qty 4

## 2023-08-23 NOTE — Plan of Care (Signed)
  Problem: Education: Goal: Knowledge of General Education information will improve Description: Including pain rating scale, medication(s)/side effects and non-pharmacologic comfort measures Outcome: Progressing   Problem: Clinical Measurements: Goal: Respiratory complications will improve Outcome: Progressing   Problem: Clinical Measurements: Goal: Cardiovascular complication will be avoided Outcome: Progressing   Problem: Coping: Goal: Level of anxiety will decrease Outcome: Progressing   Problem: Elimination: Goal: Will not experience complications related to bowel motility Outcome: Progressing Goal: Will not experience complications related to urinary retention Outcome: Progressing   Problem: Pain Managment: Goal: General experience of comfort will improve and/or be controlled Outcome: Progressing   Problem: Safety: Goal: Ability to remain free from injury will improve Outcome: Progressing   Problem: Activity: Goal: Ability to tolerate increased activity will improve Outcome: Progressing

## 2023-08-23 NOTE — Plan of Care (Signed)

## 2023-08-23 NOTE — Progress Notes (Signed)
Nephrology Follow-Up Consult note   Assessment/Recommendations: Shawna [name] is a/an 83 y.o. female with a past medical history significant for ESRD, CAD, DM 2, HTN, admitted for pneumonia    ESRD  -outpatient HD orders: [name].  4 hours.  EDW 51 kg.  TDC.  Nepro 17 H Elisio.  Flow rates: 300/500.  2K/2.5 calcium.  Heparin: Only heparin locks for TDC.  Meds: Calcitriol 0.75 mcg 3 times a week, ID PN, Mircera 200 mcg every 2 weeks (due 2/10 and given) -continue dialysis on MWF schedule   Severe sepsis Multifocal PNA, HCAP -per primary service   Acute on chronic combined systolic and diastolic CHF exacerbation -UF as tolerated with HD   Volume/ hypertension  -UF as tolerated.  Continue home blood pressure medications   Anemia of Chronic Kidney Disease -S/p transfusion and has received ESA -Transfuse PRN for Hgb <7   Secondary Hyperparathyroidism/Hyperphosphatemia - resume home meds, calctriol resumed, monitor phos    Elevated troponin -thought to be secondary to demand ischemia secondary to sepsis. Per primary   Paroxysmal Afib -not on a/c given history of anemia/GIB -per primary  Diarrhea: c dif pcr neg. Seems improved   Recommendations conveyed to primary service.    Darnell Level [facility] 08/23/2023 9:51 AM  ___________________________________________________________  CC: shortness of breath  Interval History/Subjective: patient states she feels great. Minimal sob with coughing. No more diarrhea. Hgb up to 9.8.   Medications:  Current Facility-Administered Medications  Medication Dose Route Frequency Provider Last Rate Last Admin   0.9 %  sodium chloride infusion (Manually program via Guardrails IV Fluids)   Intravenous Once Hughie Closs, MD   Paused at 08/20/23 1236   acetaminophen (TYLENOL) tablet 650 mg  650 mg Oral Q6H PRN Adefeso, Oladapo, DO   650 mg at 08/23/23 0206   alum & mag hydroxide-simeth (MAALOX/MYLANTA)  200-200-20 MG/5ML suspension 15 mL  15 mL Oral Q4H PRN Emokpae, Courage, MD       calcitRIOL (ROCALTROL) capsule 0.75 mcg  0.75 mcg Oral Q M,W,F-HD Anthony Sar, MD   0.75 mcg at 08/21/23 2039   cefTRIAXone (ROCEPHIN) 2 g in sodium chloride 0.9 % 100 mL IVPB  2 g Intravenous Q24H Adefeso, Oladapo, DO 200 mL/hr at 08/23/23 0942 2 g at 08/23/23 0942   Chlorhexidine Gluconate Cloth 2 % PADS 6 each  6 each Topical Q0600 Adefeso, Oladapo, DO   6 each at 08/22/23 0525   Chlorhexidine Gluconate Cloth 2 % PADS 6 each  6 each Topical Q0600 Dagoberto Ligas, MD   6 each at 08/23/23 0545   cinacalcet (SENSIPAR) tablet 30 mg  30 mg Oral Q M,W,F-HD Adefeso, Oladapo, DO   30 mg at 08/21/23 2040   Darbepoetin Alfa (ARANESP) injection 150 mcg  150 mcg Subcutaneous Q Mon-1800 Anthony Sar, MD   150 mcg at 08/21/23 2040   dextromethorphan-guaiFENesin (MUCINEX DM) 30-600 MG per 12 hr tablet 1 tablet  1 tablet Oral BID Adefeso, Oladapo, DO   1 tablet at 08/23/23 0932   doxycycline (VIBRAMYCIN) 100 mg in sodium chloride 0.9 % 250 mL IVPB  100 mg Intravenous Q12H Emokpae, Courage, MD 125 mL/hr at 08/23/23 0200 Infusion Verify at 08/23/23 0200   enoxaparin (LOVENOX) injection 30 mg  30 mg Subcutaneous Q24H Adefeso, Oladapo, DO   30 mg at 08/23/23 0933   guaiFENesin-dextromethorphan (ROBITUSSIN DM) 100-10 MG/5ML syrup 5 mL  5 mL Oral Q4H PRN Frankey Shown, DO  heparin injection 1,000 Units  1,000 Units Intracatheter PRN Dagoberto Ligas, MD   3,800 Units at 08/21/23 2059   hydrALAZINE (APRESOLINE) tablet 25 mg  25 mg Oral BID Adefeso, Oladapo, DO   25 mg at 08/23/23 0932   levothyroxine (SYNTHROID) tablet 125 mcg  125 mcg Oral Q0600 Adefeso, Oladapo, DO   125 mcg at 08/23/23 0932   metoprolol tartrate (LOPRESSOR) tablet 25 mg  25 mg Oral BID Hughie Closs, MD   25 mg at 08/23/23 0932   multivitamin (RENA-VIT) tablet 1 tablet  1 tablet Oral Daily Adefeso, Oladapo, DO   1 tablet at 08/23/23 0933   prochlorperazine (COMPAZINE)  injection 10 mg  10 mg Intravenous Q6H PRN Adefeso, Oladapo, DO   10 mg at 08/20/23 1507   rosuvastatin (CRESTOR) tablet 10 mg  10 mg Oral Daily Adefeso, Oladapo, DO   10 mg at 08/23/23 1478   saccharomyces boulardii (FLORASTOR) capsule 250 mg  250 mg Oral BID Vassie Loll, MD   250 mg at 08/23/23 0932   sodium chloride flush (NS) 0.9 % injection 10-40 mL  10-40 mL Intracatheter Q12H Dagoberto Ligas, MD   10 mL at 08/23/23 0933   sodium chloride flush (NS) 0.9 % injection 10-40 mL  10-40 mL Intracatheter PRN Dagoberto Ligas, MD       vancomycin (VANCOCIN) capsule 125 mg  125 mg Oral Q6H Emokpae, Courage, MD   125 mg at 08/23/23 2956      Review of Systems: 10 systems reviewed and negative except per interval history/subjective  Physical Exam: Vitals:   08/23/23 0200 08/23/23 0345  BP:  (!) 158/72  Pulse:  62  Resp:    Temp:  98.6 F (37 C)  SpO2: 94% 96%   No intake/output data recorded.  Intake/Output Summary (Last 24 hours) at 08/23/2023 0951 Last data filed at 08/23/2023 0400 Gross per 24 hour  Intake 815.22 ml  Output --  Net 815.22 ml   Constitutional: elderly but well-appearing, no acute distress ENMT: ears and nose without scars or lesions, MMM CV: normal rate, no edema Respiratory: bilateral chest rise, normal work of breathing Gastrointestinal: soft, non-tender, no palpable masses or hernias Skin: no visible lesions or rashes Psych: alert, judgement/insight appropriate, appropriate mood and affect   Test Results I personally reviewed new and old clinical labs and radiology tests Lab Results  Component Value Date   NA 133 (L) 08/23/2023   K 3.3 (L) 08/23/2023   CL 98 08/23/2023   CO2 22 08/23/2023   BUN 23 08/23/2023   CREATININE 4.05 (H) 08/23/2023   CALCIUM 8.1 (L) 08/23/2023   ALBUMIN 3.0 (L) 08/21/2023   PHOS 4.5 08/21/2023    CBC Recent Labs  Lab 08/20/23 0407 08/21/23 1713 08/23/23 0424  WBC 7.0 4.5 3.3*  HGB 7.1* 6.2* 9.8*  HCT 22.3* 19.5* 29.7*   MCV 116.1* 118.2* 103.1*  PLT 193 177 179

## 2023-08-23 NOTE — Progress Notes (Signed)
Pt complaining of large spiders on wall/ceiling and running up and down IV tubing. Pt had previously c/o medium sized black bug on wall and asked this RN to investigate. No bug found. Pt now visually hallucinating spiders as pt insisted spiders visible when there were none. Pt is confused.  Per PCT, pt had episode of hallucinating bugs the night before around 0300.

## 2023-08-23 NOTE — Progress Notes (Signed)
PROGRESS NOTE   Shawna [name]  UEA:[id number] DOB: [birth year] DOA: 08/19/2023 PCP: [facility]   Brief Narrative:  HPI: Shawna [name] is a 83 y.o. female with medical history significant of ESRD on hemodialysis (MWF), CAD status post CABG, HFrEF, T2DM, hypertension, GERD, atrial fibrillation (not on anticoagulants) who presents to the emergency department with complaint of cough which started today.  She complained of 2 to 3-day onset of not feeling well and complained of body aches, productive cough (unsure of sputum color), subjective fever.  Shortness of breath worsened today, so she presented to the ED for further evaluation and management.   ED Course: In the emergency department, she was tachypneic, tachycardic, BP was 178/80, O2 sat was 89% on room air, this improved to 100% on supplemental oxygen via [place] at 2 LPM.  Temperature 98.6 F.  Workup in the ED showed hemoglobin 8.1, hematocrit 25.3, MCV 114.5.  BMP was normal except for chloride 97, blood glucose 185, creatinine 3.36, GFR 13.  Lactic acid 1.8 > 2.0.  Troponin 89 >129.  Influenza A, B, SARS, respiratory, RSV was negative. CT chest without contrast showed bibasilar airspace disease and bronchial wall thickening consistent with multifocal bronchopneumonia.  Small free-flowing bilateral pleural effusions, right greater than left. Chest x-ray showed vascular congestion with persistent bibasilar airspace disease and small bilateral pleural effusions.  Overall findings are consistent with pulmonary edema, and/or underlying infection will be difficult to exclude.  No significant change since prior exam Patient was started on IV ceftriaxone and azithromycin, IV hydralazine 10 mg x 1 was given due to elevated BP and breathing treatment was provided. Hospitalist was asked admit patient for further evaluation and management.   Of note, patient was recently hospitalized for 3 days for bradycardia which was asymptomatic, patient's  metoprolol and amiodarone was discontinued at discharge on 08/08/2023 per cardiology recommendations.  Assessment & Plan:   Principal Problem:   Multifocal pneumonia Active Problems:   PAF (paroxysmal atrial fibrillation) (HCC)   Diabetes mellitus type 2 in nonobese [facility])   Acquired hypothyroidism   Mixed hyperlipidemia   Chronic combined systolic and diastolic CHF (congestive heart failure) (HCC)   Hypertensive urgency   Elevated troponin   Acute respiratory failure with hypoxia (HCC)   Pulmonary edema   Severe sepsis (HCC)   Bilateral pleural effusion  1)Severe sepsis and acute hypoxic respiratory failure secondary to multifocal/healthcare associated pneumonia, POA:  PCT 0.44, no leukocytosis -Antibiotics has been transitioned to oral Augmentin to complete therapy. -Azithromycin discontinued due to concerns about QT prolongation -No leukocytosis, no fevers and now with good saturation.  2)Diarrhea: -Patient reports improvement in the amount of episodes -Florastor has been started -C. Diff stool evaluation demonstrating colonization without acute infection. -Oral vancomycin has been discontinued.  3)Acute on chronic combined systolic and diastolic congestive heart failure/volume overload bilateral pleural effusion/acute pulmonary edema/ESRD on HD, POA: -admission Chest x-ray was consistent with pulmonary edema -Patient had elevated troponin in the setting of CHF/demand ischemia and ESRD ----troponin pattern is Not consistent with ACS -Continue to use hemodialysis to address volume status -Echocardiogram from 08/22/23  with EF of 45 to 50% with grade 2 diastolic dysfunction -Consistent volume management with hemodialysis.  4) acute on chronic symptomatic anemia (in the setting of anemia of chronic disease and underlying history of GI bleed).-- Hgb down to 6.2 -Patient with underlying chronic anemia of ESRD compounded by history of GI bleed as well -Case has been discussed with  nephrology service; 2 units  PRBCs have been transfused. -Hemoglobin currently stable. -Continue IV iron and Epogen therapy as per nephrology discretion. -No active bleeding appreciated throughout this hospitalization.  5)Prolonged QT interval/hypomagnesemia QTc 506 ms  Avoid QT prolonging drugs.  Magnesium is low, will replenish. -Zithromax has been discontinued in the setting of QT prolongation -Continue treatment with Augmentin to complete antibiotic therapy.   6)Hypertensive urgency -resolved -Continue current antihypertensive regimen. -Follow vital signs.   7)Paroxysmal atrial fibrillation: -No anticoagulation due to history of GI bleed and anemia of ESRD with symptomatic anemia requiring transfusion -Beta-blockers and amiodarone were discontinued by Dr. Jacqulyn Bath during recent hospitalization due to bradycardia -Continue metoprolol with holding parameters.  8) acute hypoxic respiratory failure--multifactorial due to #1, #2 #3 above -Patient no longer requiring oxygen supplementation and demonstrating good saturation on room air -Continue as needed bronchodilator management and transition antibiotics to oral route.  9)ESRD---c/n HD on MWF, last HD on 08/21/23 -Planning for hemodialysis later today.  10)Mixed hyperlipidemia Continue Crestor.   11)Acquired hypothyroidism: Continue Synthroid.  DVT prophylaxis: enoxaparin (LOVENOX) injection 30 mg Start: 08/20/23 1000 SCDs Start: 08/20/23 0022   Code Status: Full Code  Family Communication: No family at bedside.  Status is: Inpatient Remains inpatient appropriate because: Patient very sick and requires inpatient care as mentioned above.   Estimated body mass index is 20.37 kg/m as calculated from the following:   Height as of this encounter: 5\' 1"  (1.549 m).   Weight as of this encounter: 48.9 kg.    Nutritional Assessment: Body mass index is 20.37 kg/m.Marland Kitchen Seen by dietician.  I agree with the assessment and plan as  outlined below:  Consultants:  Nephrology  Procedures:  HD  Antimicrobials:  Anti-infectives (From admission, onward)    Start     Dose/Rate Route Frequency Ordered Stop   08/22/23 1500  vancomycin (VANCOCIN) capsule 125 mg  Status:  Discontinued        125 mg Oral Every 6 hours 08/22/23 1444 08/23/23 1450   08/21/23 1000  doxycycline (VIBRAMYCIN) 100 mg in sodium chloride 0.9 % 250 mL IVPB        100 mg 125 mL/hr over 120 Minutes Intravenous Every 12 hours 08/21/23 0855     08/20/23 1000  cefTRIAXone (ROCEPHIN) 2 g in sodium chloride 0.9 % 100 mL IVPB        2 g 200 mL/hr over 30 Minutes Intravenous Every 24 hours 08/20/23 0026 08/23/23 1012   08/20/23 1000  azithromycin (ZITHROMAX) 500 mg in sodium chloride 0.9 % 250 mL IVPB  Status:  Discontinued        500 mg 250 mL/hr over 60 Minutes Intravenous Every 24 hours 08/20/23 0026 08/21/23 0855   08/19/23 2300  cefTRIAXone (ROCEPHIN) 1 g in sodium chloride 0.9 % 100 mL IVPB        1 g 200 mL/hr over 30 Minutes Intravenous  Once 08/19/23 2251 08/19/23 2344   08/19/23 2300  azithromycin (ZITHROMAX) 500 mg in sodium chloride 0.9 % 250 mL IVPB        500 mg 250 mL/hr over 60 Minutes Intravenous  Once 08/19/23 2251 08/20/23 0114         Subjective: Afebrile, no chest pain, no nausea or vomiting.  Still experiencing intermittent episode of loose stools and complaining of intermittent mildly productive coughing spells.  Objective: Vitals:   08/23/23 1630 08/23/23 1700 08/23/23 1730 08/23/23 1800  BP: (!) 181/78 (!) 188/81 (!) 163/65 129/67  Pulse:      Resp: 20  19 19 18   Temp:      TempSrc:      SpO2:      Weight:      Height:        Intake/Output Summary (Last 24 hours) at 08/23/2023 1811 Last data filed at 08/23/2023 0400 Gross per 24 hour  Intake 815.22 ml  Output --  Net 815.22 ml   Filed Weights   08/22/23 1937 08/23/23 0345 08/23/23 1600  Weight: 49.2 kg 49.2 kg 48.9 kg     Physical Exam General exam: Alert,  awake, following commands appropriately and complaining of intermittent coughing spells and hoarseness.  No nausea, no vomiting, no chest pain.  Still having loose stools (less episodes per days). Respiratory system: No requiring oxygen supplementation; no using accessory muscles.  Positive rhonchi appreciated bilaterally. Cardiovascular system: Rate controlled, no rubs, no gallops, no murmurs appreciated on exam.  Left IJ hemodialysis catheter in place. Gastrointestinal system: Abdomen is nondistended, soft and nontender. No organomegaly or masses felt. Normal bowel sounds heard. Central nervous system:  No focal neurological deficits. Extremities: No C/C/E, +pedal pulses Skin: No petechiae. Psychiatry: Mood & affect appropriate.    Data Reviewed: I have personally reviewed following labs and imaging studies  CBC: Recent Labs  Lab 08/19/23 2056 08/20/23 0407 08/21/23 1713 08/23/23 0424  WBC 6.8 7.0 4.5 3.3*  HGB 8.1* 7.1* 6.2* 9.8*  HCT 25.3* 22.3* 19.5* 29.7*  MCV 114.5* 116.1* 118.2* 103.1*  PLT 230 193 177 179   Basic Metabolic Panel: Recent Labs  Lab 08/19/23 2056 08/20/23 0407 08/21/23 1713 08/23/23 0424  NA 138 136 135 133*  K 3.5 3.4* 3.9 3.3*  CL 97* 97* 97* 98  CO2 29 25 26 22   GLUCOSE 185* 163* 107* 105*  BUN 22 26* 29* 23  CREATININE 3.36* 3.78* 4.37* 4.05*  CALCIUM 9.2 8.9 8.7* 8.1*  MG  --  1.4*  --   --   PHOS  --  3.2 4.5  --    GFR: Estimated Creatinine Clearance: 7.9 mL/min (A) (by C-G formula based on SCr of 4.05 mg/dL (H)).  Liver Function Tests: Recent Labs  Lab 08/20/23 0407 08/21/23 1713  AST 34  --   ALT 19  --   ALKPHOS 140*  --   BILITOT 1.0  --   PROT 6.6  --   ALBUMIN 2.9* 3.0*   Coagulation Profile: Recent Labs  Lab 08/19/23 2102  INR 1.0   Sepsis Labs: Recent Labs  Lab 08/19/23 2102 08/19/23 2247 08/20/23 0145 08/20/23 0407  PROCALCITON  --   --   --  0.44  LATICACIDVEN 1.8 2.0* 2.2* 2.4*    Recent Results (from  the past 240 hours)  MRSA Next Gen by PCR, Nasal     Status: None   Collection Time: 08/19/23 12:20 AM   Specimen: Nasal Mucosa; Nasal Swab  Result Value Ref Range Status   MRSA by PCR Next Gen NOT DETECTED NOT DETECTED Final    Comment: (NOTE) The GeneXpert MRSA Assay (FDA approved for NASAL specimens only), is one component of a comprehensive MRSA colonization surveillance program. It is not intended to diagnose MRSA infection nor to guide or monitor treatment for MRSA infections. Test performance is not FDA approved in patients less than 59 years old. Performed at [facility], [address] [place], [place] [address]   Culture, blood (Routine x 2)     Status: None (Preliminary result)   Collection Time: 08/19/23  9:02 PM  Specimen: BLOOD  Result Value Ref Range Status   Specimen Description BLOOD  Final   Special Requests NONE  Final   Culture   Final    NO GROWTH 4 DAYS Performed at [facility], [address] [place], [place] [address]    Report Status PENDING  Incomplete  Resp panel by RT-PCR (RSV, Flu A&B, Covid) Anterior Nasal Swab     Status: None   Collection Time: 08/19/23  9:06 PM   Specimen: Anterior Nasal Swab  Result Value Ref Range Status   SARS Coronavirus 2 by RT PCR NEGATIVE NEGATIVE Final    Comment: (NOTE) SARS-CoV-2 target nucleic acids are NOT DETECTED.  The SARS-CoV-2 RNA is generally detectable in upper respiratory specimens during the acute phase of infection. The lowest concentration of SARS-CoV-2 viral copies this assay can detect is 138 copies/mL. A negative result does not preclude SARS-Cov-2 infection and should not be used as the sole basis for treatment or other patient management decisions. A negative result may occur with  improper specimen collection/handling, submission of specimen other than nasopharyngeal swab, presence of viral mutation(s) within the areas targeted by this assay, and inadequate number of viral copies(<138  copies/mL). A negative result must be combined with clinical observations, patient history, and epidemiological information. The expected result is Negative.  Fact Sheet for Patients:  BloggerCourse.com  Fact Sheet for Healthcare Providers:  SeriousBroker.it  This test is no t yet approved or cleared by the [place] FDA and  has been authorized for detection and/or diagnosis of SARS-CoV-2 by FDA under an Emergency Use Authorization (EUA). This EUA will remain  in effect (meaning this test can be used) for the duration of the COVID-19 declaration under Section 564(b)(1) of the Act, 21 U.S.C.section 360bbb-3(b)(1), unless the authorization is terminated  or revoked sooner.       Influenza A by PCR NEGATIVE NEGATIVE Final   Influenza B by PCR NEGATIVE NEGATIVE Final    Comment: (NOTE) The Xpert Xpress SARS-CoV-2/FLU/RSV plus assay is intended as an aid in the diagnosis of influenza from Nasopharyngeal swab specimens and should not be used as a sole basis for treatment. Nasal washings and aspirates are unacceptable for Xpert Xpress SARS-CoV-2/FLU/RSV testing.  Fact Sheet for Patients: BloggerCourse.com  Fact Sheet for Healthcare Providers: SeriousBroker.it  This test is not yet approved or cleared by the [place] FDA and has been authorized for detection and/or diagnosis of SARS-CoV-2 by FDA under an Emergency Use Authorization (EUA). This EUA will remain in effect (meaning this test can be used) for the duration of the COVID-19 declaration under Section 564(b)(1) of the Act, 21 U.S.C. section 360bbb-3(b)(1), unless the authorization is terminated or revoked.     Resp Syncytial Virus by PCR NEGATIVE NEGATIVE Final    Comment: (NOTE) Fact Sheet for Patients: BloggerCourse.com  Fact Sheet for Healthcare  Providers: SeriousBroker.it  This test is not yet approved or cleared by the [place] FDA and has been authorized for detection and/or diagnosis of SARS-CoV-2 by FDA under an Emergency Use Authorization (EUA). This EUA will remain in effect (meaning this test can be used) for the duration of the COVID-19 declaration under Section 564(b)(1) of the Act, 21 U.S.C. section 360bbb-3(b)(1), unless the authorization is terminated or revoked.  Performed at [facility], [address], [place], [place] [address]   Culture, blood (Routine x 2)     Status: None (Preliminary result)   Collection Time: 08/19/23 10:47 PM   Specimen: BLOOD RIGHT HAND  Result Value Ref Range Status   Specimen Description BLOOD RIGHT HAND  Final   Special Requests NONE  Final   Culture   Final    NO GROWTH 4 DAYS Performed at [facility], [address], [place], [place] [address]    Report Status PENDING  Incomplete  C Difficile Quick Screen w PCR reflex     Status: Abnormal   Collection Time: 08/20/23 10:17 AM   Specimen: STOOL  Result Value Ref Range Status   C Diff antigen POSITIVE (A) NEGATIVE Final   C Diff toxin NEGATIVE NEGATIVE Final   C Diff interpretation Results are indeterminate. See PCR results.  Final    Comment: Performed at [facility], [address] [place], [place] [address]  C. Diff by PCR, Reflexed     Status: None   Collection Time: 08/20/23 10:17 AM  Result Value Ref Range Status   Toxigenic C. Difficile by PCR NEGATIVE NEGATIVE Final    Comment: Patient is colonized with non toxigenic C. difficile. May not need treatment unless significant symptoms are present. Performed at [facility], [address], [place], [place] [address]      Radiology Studies: ECHOCARDIOGRAM COMPLETE Result Date: 08/22/2023    ECHOCARDIOGRAM REPORT   Patient Name:   BALINDA HEACOCK Date of Exam: 08/22/2023 Medical Rec #:  [id number]    Height:       61.0 in Accession #:     [id number]   Weight:       103.2 lb Date of Birth:  05-31-1941    BSA:          1.426 m Patient Age:    82 years     BP:           140/51 mmHg Patient Gender: F            HR:           59 bpm. [facility] Procedure: 2D Echo, Color Doppler and Cardiac Doppler Indications:    Elevated Troponin  History:        Patient has prior history of Echocardiogram examinations, most                 recent 07/16/2021. CAD and Previous Myocardial Infarction, Prior                 CABG, Arrythmias:LBBB and Bradycardia; Risk                 Factors:Hypertension, Diabetes and Dyslipidemia. ESRD on                 hemodialysis.  Sonographer:    Celesta Gentile RCS Referring Phys: [id number] RAVI PAHWANI IMPRESSIONS  1. Left ventricular ejection fraction, by estimation, is 45 to 50%. The left ventricle has mildly decreased function. The left ventricle demonstrates regional wall motion abnormalities (see scoring diagram/findings for description). Left ventricular diastolic parameters are consistent with Grade II diastolic dysfunction (pseudonormalization). Elevated left atrial pressure.  2. Right ventricular systolic function is moderately reduced. The right ventricular size is normal. There is moderately elevated pulmonary artery systolic pressure.  3. Left atrial size was moderately dilated.  4. The mitral valve is normal in structure. Mild mitral valve regurgitation. No evidence of mitral stenosis.  5. The aortic valve is tricuspid. There is moderate calcification of the aortic valve. Aortic valve regurgitation is not visualized. No aortic stenosis is present.  6. The inferior vena cava is normal in  size with greater than 50% respiratory variability, suggesting right atrial pressure of 3 mmHg. Comparison(s): Changes from prior study are noted. LVEF improved from 30-35% in 2023 to 45-50% now. FINDINGS  Left Ventricle: Left ventricular ejection fraction, by estimation, is 45 to 50%. The left ventricle has mildly decreased  function. The left ventricle demonstrates regional wall motion abnormalities. The left ventricular internal cavity size was normal in size. There is no left ventricular hypertrophy. Left ventricular diastolic parameters are consistent with Grade II diastolic dysfunction (pseudonormalization). Elevated left atrial pressure.  LV Wall Scoring: The anterior septum is akinetic. The antero-lateral wall, inferior septum, and apical lateral segment are hypokinetic. The entire anterior wall, entire inferior wall, posterior wall, and apex are normal. Right Ventricle: The right ventricular size is normal. No increase in right ventricular wall thickness. Right ventricular systolic function is moderately reduced. There is moderately elevated pulmonary artery systolic pressure. The tricuspid regurgitant velocity is 3.28 m/s, and with an assumed right atrial pressure of 15 mmHg, the estimated right ventricular systolic pressure is 58.0 mmHg. Left Atrium: Left atrial size was moderately dilated. Right Atrium: Right atrial size was normal in size. Pericardium: There is no evidence of pericardial effusion. Mitral Valve: The mitral valve is normal in structure. Mild mitral valve regurgitation. No evidence of mitral valve stenosis. Tricuspid Valve: The tricuspid valve is normal in structure. Tricuspid valve regurgitation is mild . No evidence of tricuspid stenosis. Aortic Valve: The aortic valve is tricuspid. There is moderate calcification of the aortic valve. Aortic valve regurgitation is not visualized. No aortic stenosis is present. Pulmonic Valve: The pulmonic valve was not well visualized. Pulmonic valve regurgitation is trivial. No evidence of pulmonic stenosis. Aorta: The aortic root is normal in size and structure. Venous: The inferior vena cava is normal in size with greater than 50% respiratory variability, suggesting right atrial pressure of 3 mmHg. IAS/Shunts: No atrial level shunt detected by color flow Doppler.  LEFT  VENTRICLE PLAX 2D LVIDd:         3.70 cm   Diastology LVIDs:         3.00 cm   LV e' medial:    2.70 cm/s LV PW:         1.60 cm   LV E/e' medial:  56.3 LV IVS:        1.50 cm   LV e' lateral:   4.88 cm/s LVOT diam:     1.70 cm   LV E/e' lateral: 31.1 LV SV:         35 LV SV Index:   25 LVOT Area:     2.27 cm  RIGHT VENTRICLE RV S prime:     5.18 cm/s TAPSE (M-mode): 1.3 cm LEFT ATRIUM             Index        RIGHT ATRIUM           Index LA diam:        4.10 cm 2.88 cm/m   RA Area:     13.90 cm LA Vol (A2C):   74.4 ml 52.19 ml/m  RA Volume:   34.30 ml  24.06 ml/m LA Vol (A4C):   63.5 ml 44.54 ml/m LA Biplane Vol: 68.8 ml 48.26 ml/m  AORTIC VALVE LVOT Vmax:   75.40 cm/s LVOT Vmean:  47.900 cm/s LVOT VTI:    0.156 m  AORTA Ao Root diam: 2.70 cm MITRAL VALVE  TRICUSPID VALVE MV Area (PHT): 3.99 cm       TR Peak grad:   43.0 mmHg MV Decel Time: 190 msec       TR Vmax:        328.00 cm/s MR Peak grad:    128.6 mmHg MR Mean grad:    83.0 mmHg    SHUNTS MR Vmax:         567.00 cm/s  Systemic VTI:  0.16 m MR Vmean:        431.0 cm/s   Systemic Diam: 1.70 cm MR PISA:         1.57 cm MR PISA Eff ROA: 8 mm MR PISA Radius:  0.50 cm MV E velocity: 152.00 cm/s MV A velocity: 92.00 cm/s MV E/A ratio:  1.65 Vishnu Priya Mallipeddi Electronically signed by Winfield Rast Mallipeddi Signature Date/Time: 08/22/2023/5:11:00 PM    Final    Scheduled Meds:  sodium chloride   Intravenous Once   calcitRIOL  0.75 mcg Oral Q M,W,F-HD   Chlorhexidine Gluconate Cloth  6 each Topical Q0600   Chlorhexidine Gluconate Cloth  6 each Topical Q0600   cinacalcet  30 mg Oral Q M,W,F-HD   darbepoetin (ARANESP) injection - DIALYSIS  150 mcg Subcutaneous Q Mon-1800   dextromethorphan-guaiFENesin  1 tablet Oral BID   enoxaparin (LOVENOX) injection  30 mg Subcutaneous Q24H   hydrALAZINE  25 mg Oral BID   levothyroxine  125 mcg Oral Q0600   metoprolol tartrate  25 mg Oral BID   multivitamin  1 tablet Oral Daily    rosuvastatin  10 mg Oral Daily   saccharomyces boulardii  250 mg Oral BID   sodium chloride flush  10-40 mL Intracatheter Q12H   Continuous Infusions:  doxycycline (VIBRAMYCIN) IV 100 mg (08/23/23 1041)     LOS: 4 days   Vassie Loll, MD [facility]  08/23/2023, 6:11 PM   How to contact the [facility] Attending or Consulting provider 7A - 7P or covering provider during after hours 7P -7A, for this patient?

## 2023-08-23 NOTE — Progress Notes (Signed)
   HEMODIALYSIS TREATMENT NOTE:  Uneventful 3.5 hour heparin-free treatment completed using left tunneled catheter. UF was limited by labile BP.  Net UF 2.2 liters.  All blood was returned.    Post-HD:  08/23/23 1945  Vitals  Temp 98 F (36.7 C)  Temp Source Oral  BP (!) 156/57  MAP (mmHg) 85  BP Location Right Arm  BP Method Automatic  Patient Position (if appropriate) Lying  Pulse Rate 66  Pulse Rate Source Monitor  ECG Heart Rate 62  Resp 14  Oxygen Therapy  SpO2 100 %  O2 Device Room Air  Post Treatment  Dialyzer Clearance Lightly streaked  Hemodialysis Intake (mL) 0 mL  Liters Processed 66.3  Fluid Removed (mL) 2200 mL  Tolerated HD Treatment Yes  Post-Hemodialysis Comments 2.2L removed  Hemodialysis Catheter Double lumen Permanent (Tunneled)  No placement date or time found.   Placed prior to admission: Yes  Hemodialysis Catheter Type: Double lumen Permanent (Tunneled)  Site Condition No complications  Blue Lumen Status Flushed;Heparin locked;Dead end cap in place  Red Lumen Status Flushed;Heparin locked;Dead end cap in place  Purple Lumen Status N/A  Catheter fill solution Heparin 1000 units/ml  Catheter fill volume (Arterial) 1.9 cc  Catheter fill volume (Venous) 1.9  Dressing Type Transparent;Tube stabilization device  Dressing Status Antimicrobial disc/dressing in place;Clean, Dry, Intact  Interventions Dressing reinforced  Drainage Description None  Dressing Change Due 08/27/23  Post treatment catheter status Capped and Clamped    Arman Filter, RN AP KDU

## 2023-08-24 ENCOUNTER — Inpatient Hospital Stay: Payer: Medicare Other | Attending: Cardiology | Admitting: Cardiology

## 2023-08-24 DIAGNOSIS — R7989 Other specified abnormal findings of blood chemistry: Secondary | ICD-10-CM | POA: Diagnosis not present

## 2023-08-24 DIAGNOSIS — J189 Pneumonia, unspecified organism: Secondary | ICD-10-CM | POA: Diagnosis not present

## 2023-08-24 DIAGNOSIS — R0902 Hypoxemia: Secondary | ICD-10-CM

## 2023-08-24 DIAGNOSIS — E782 Mixed hyperlipidemia: Secondary | ICD-10-CM | POA: Diagnosis not present

## 2023-08-24 DIAGNOSIS — E039 Hypothyroidism, unspecified: Secondary | ICD-10-CM | POA: Diagnosis not present

## 2023-08-24 LAB — CULTURE, BLOOD (ROUTINE X 2)
Culture: NO GROWTH
Culture: NO GROWTH

## 2023-08-24 MED ORDER — AMOXICILLIN-POT CLAVULANATE 500-125 MG PO TABS: 1.0000 | ORAL_TABLET | Freq: Two times a day (BID) | ORAL | 0 refills | Status: AC

## 2023-08-24 MED ORDER — METOPROLOL TARTRATE 25 MG PO TABS: 25.0000 mg | ORAL_TABLET | Freq: Two times a day (BID) | ORAL | 1 refills | Status: DC

## 2023-08-24 MED ORDER — FAMOTIDINE 20 MG PO TABS
20.0000 mg | ORAL_TABLET | Freq: Every day | ORAL | 1 refills | Status: DC
Start: 2023-08-24 — End: 2023-09-10

## 2023-08-24 MED ORDER — DM-GUAIFENESIN ER 30-600 MG PO TB12: 1.0000 | ORAL_TABLET | Freq: Two times a day (BID) | ORAL | 0 refills | Status: AC

## 2023-08-24 MED ORDER — SACCHAROMYCES BOULARDII 250 MG PO CAPS: 250.0000 mg | ORAL_CAPSULE | Freq: Two times a day (BID) | ORAL | 0 refills | Status: DC

## 2023-08-24 NOTE — TOC Transition Note (Signed)
Transition of Care [facility]) - Discharge Note   Patient Details  Name: Shawna [name] MRN: [id number] Date of Birth: [birth year]  Transition of Care [facility]) CM/SW Contact:  Villa Herb, LCSWA Phone Number: 08/24/2023, 10:08 AM   Clinical Narrative:    CSW updated that pt will D/C home today. CSW updated Sarah with Kathi Der of plan for D/C home. HH orders have been placed by MD. TOC signing off.   Final next level of care: Home w Home Health Services Barriers to Discharge: Barriers Resolved   Patient Goals and CMS Choice Patient states their goals for this hospitalization and ongoing recovery are:: return home CMS Medicare.gov Compare Post Acute Care list provided to:: Patient Choice offered to / list presented to : Patient      Discharge Placement                       Discharge Plan and Services Additional resources added to the After Visit Summary for   In-house Referral: Clinical Social Work   Post Acute Care Choice: Home Health                    HH Arranged: PT [facility] Agency: [facility] Date [facility] Agency Contacted: 08/22/23   Representative spoke with at [facility] Agency: Maralyn Sago  Social Drivers of Health (SDOH) Interventions SDOH Screenings   Food Insecurity: No Food Insecurity (08/20/2023)  Housing: Low Risk  (08/23/2023)  Transportation Needs: No Transportation Needs (08/21/2023)  Utilities: Not At Risk (08/21/2023)  Financial Resource Strain: Low Risk  (12/14/2018)  Physical Activity: Unknown (12/14/2018)  Social Connections: Unknown (08/23/2023)  Stress: No Stress Concern Present (12/14/2018)  Tobacco Use: Low Risk  (08/19/2023)  Health Literacy: Adequate Health Literacy (08/01/2023)   Received from [facility]  Recent Concern: Health Literacy - Inadequate Health Literacy (07/20/2023)   Received from [facility]     Readmission Risk Interventions    08/20/2023    2:28 PM 05/02/2023    2:17 PM 01/29/2023   11:08 AM  Readmission Risk  Prevention Plan  Transportation Screening Complete Complete Complete  Medication Review Ocean[employer]) Complete Complete Complete  PCP or Specialist appointment within 3-5 days of discharge Complete  Complete  HRI or Home Care Consult Complete Complete Complete  SW Recovery Care/Counseling Consult Complete Complete Complete  Palliative Care Screening  Not Applicable Not Applicable  Skilled Nursing Facility  Not Applicable Not Applicable

## 2023-08-24 NOTE — Plan of Care (Signed)
  Problem: Education: Goal: Knowledge of General Education information will improve Description: Including pain rating scale, medication(s)/side effects and non-pharmacologic comfort measures Outcome: Progressing   Problem: Health Behavior/Discharge Planning: Goal: Ability to manage health-related needs will improve Outcome: Progressing   Problem: Clinical Measurements: Goal: Ability to maintain clinical measurements within normal limits will improve Outcome: Progressing Goal: Will remain free from infection Outcome: Progressing Goal: Diagnostic test results will improve Outcome: Progressing Goal: Respiratory complications will improve Outcome: Progressing Goal: Cardiovascular complication will be avoided Outcome: Progressing   Problem: Activity: Goal: Risk for activity intolerance will decrease Outcome: Progressing   Problem: Nutrition: Goal: Adequate nutrition will be maintained Outcome: Progressing   Problem: Coping: Goal: Level of anxiety will decrease Outcome: Progressing   Problem: Elimination: Goal: Will not experience complications related to bowel motility Outcome: Progressing Goal: Will not experience complications related to urinary retention Outcome: Progressing   Problem: Pain Managment: Goal: General experience of comfort will improve and/or be controlled Outcome: Progressing   Problem: Safety: Goal: Ability to remain free from injury will improve Outcome: Progressing   Problem: Skin Integrity: Goal: Risk for impaired skin integrity will decrease Outcome: Progressing   Problem: Activity: Goal: Ability to tolerate increased activity will improve Outcome: Progressing   Problem: Clinical Measurements: Goal: Ability to maintain a body temperature in the normal range will improve Outcome: Progressing   Problem: Respiratory: Goal: Ability to maintain adequate ventilation will improve Outcome: Progressing Goal: Ability to maintain a clear airway  will improve Outcome: Progressing

## 2023-08-24 NOTE — Progress Notes (Deleted)
 Clinical Summary Shawna [name] is a 83 y.o.female   Past Medical History:  Diagnosis Date   Acute on chronic respiratory failure with hypoxia (HCC) 10/10/2016   Anxiety    Arthritis    AVM (arteriovenous malformation) of colon    CAD (coronary artery disease)    a. s/p CABG in 2013 b. DES to D1 in 10/2016. c. cath in 07/2018 showing patent grafts with occlusion of D1 at prior stent site and progression of PDA disease --> medical management recommended   Carotid artery disease (HCC)    a. 60-79% LICA, 03/2012    Chronic bronchitis (HCC)    Chronic HFrEF (heart failure with reduced ejection fraction) (HCC)    Colon cancer (HCC) 1992   Esophageal stricture    ESRD on hemodialysis (HCC)    ESRD due to HTN, started dialysis 2011 and gets HD at [facility] with Dr Fausto Skillern on MWF schedule.  Access is LUA AVF as of Sept 2014.    GERD (gastroesophageal reflux disease)    High cholesterol 12/2011   History of blood transfusion 07/2011; 12/2011; 01/2012 X 2; 04/2012   History of gout    History of lower GI bleeding    Hypertension    Iron deficiency anemia    Jugular vein occlusion, right (HCC)    Mitral regurgitation    a. Moderate by echo, 02/2012   Mitral valve disease    NSVT (nonsustained ventricular tachycardia) (HCC)    Ovarian cancer (HCC) 1992   PAF (paroxysmal atrial fibrillation) (HCC)    Pneumonia ~ 2009   PUD (peptic ulcer disease)    TIA (transient ischemic attack)    Tricuspid valve disease      Allergies  Allergen Reactions   Amlodipine Swelling   Aspirin Other (See Comments)    High Doses Mess up her stomach; "makes my bowels have blood in them". Takes 81 mg EC Aspirin    Nitrofurantoin Hives   Ranexa [Ranolazine] Other (See Comments)    Myoclonus-hospitalized    Bactrim [Sulfamethoxazole-Trimethoprim] Rash   Iodinated Contrast Media Itching and Other (See Comments)   Iron Itching and Other (See Comments)    "they gave me iron in dialysis; had to give me  Benadryl cause I had to have the iron" (05/02/2012)   Tylenol [Acetaminophen] Itching and Other (See Comments)    Makes her feet on fire per pt - can tolerate per pt   Gabapentin Other (See Comments)    Unknown reaction   Sucroferric Oxyhydroxide Other (See Comments)    Unknown   Levaquin [Levofloxacin In D5w] Rash   Levofloxacin Rash and Other (See Comments)   Plavix [Clopidogrel Bisulfate] Rash   Protonix [Pantoprazole Sodium] Rash   Venofer [Ferric Oxide] Itching and Other (See Comments)    Patient reports using Benadryl prior to doses as [facility]     No current facility-administered medications for this visit.   Current Outpatient Medications  Medication Sig Dispense Refill   amoxicillin-clavulanate (AUGMENTIN) 500-125 MG tablet Take 1 tablet by mouth every 12 (twelve) hours for 3 days. 6 tablet 0   dextromethorphan-guaiFENesin (MUCINEX DM) 30-600 MG 12hr tablet Take 1 tablet by mouth 2 (two) times daily for 7 days. 14 tablet 0   famotidine (PEPCID) 20 MG tablet Take 1 tablet (20 mg total) by mouth at bedtime. 30 tablet 1   metoprolol tartrate (LOPRESSOR) 25 MG tablet Take 1 tablet (25 mg total) by mouth 2 (two) times daily. 60  tablet 1   saccharomyces boulardii (FLORASTOR) 250 MG capsule Take 1 capsule (250 mg total) by mouth 2 (two) times daily. 30 capsule 0   Facility-Administered Medications Ordered in Other Visits  Medication Dose Route Frequency Provider Last Rate Last Admin   0.9 %  sodium chloride infusion (Manually program via Guardrails IV Fluids)   Intravenous Once Hughie Closs, MD   Paused at 08/20/23 1236   acetaminophen (TYLENOL) tablet 650 mg  650 mg Oral Q6H PRN Adefeso, Oladapo, DO   650 mg at 08/23/23 2148   alum & mag hydroxide-simeth (MAALOX/MYLANTA) 200-200-20 MG/5ML suspension 15 mL  15 mL Oral Q4H PRN Emokpae, Courage, MD       amoxicillin-clavulanate (AUGMENTIN) 500-125 MG per tablet 1 tablet  1 tablet Oral Q12H Vassie Loll, MD   1 tablet at  08/24/23 1049   calcitRIOL (ROCALTROL) capsule 0.75 mcg  0.75 mcg Oral Q M,W,F-HD Anthony Sar, MD   0.75 mcg at 08/23/23 1855   Chlorhexidine Gluconate Cloth 2 % PADS 6 each  6 each Topical Q0600 Adefeso, Oladapo, DO   6 each at 08/24/23 0644   Chlorhexidine Gluconate Cloth 2 % PADS 6 each  6 each Topical Q0600 Dagoberto Ligas, MD   6 each at 08/24/23 [phone]   cinacalcet (SENSIPAR) tablet 30 mg  30 mg Oral Q M,W,F-HD Adefeso, Oladapo, DO   30 mg at 08/23/23 1855   Darbepoetin Alfa (ARANESP) injection 150 mcg  150 mcg Subcutaneous Q Mon-1800 Anthony Sar, MD   150 mcg at 08/21/23 2040   dextromethorphan-guaiFENesin (MUCINEX DM) 30-600 MG per 12 hr tablet 1 tablet  1 tablet Oral BID Adefeso, Oladapo, DO   1 tablet at 08/24/23 1044   enoxaparin (LOVENOX) injection 30 mg  30 mg Subcutaneous Q24H Adefeso, Oladapo, DO   30 mg at 08/24/23 1049   guaiFENesin-dextromethorphan (ROBITUSSIN DM) 100-10 MG/5ML syrup 5 mL  5 mL Oral Q4H PRN Adefeso, Oladapo, DO   5 mL at 08/23/23 2152   heparin injection 1,000 Units  1,000 Units Intracatheter PRN Dagoberto Ligas, MD   3,800 Units at 08/23/23 1945   hydrALAZINE (APRESOLINE) tablet 25 mg  25 mg Oral BID Adefeso, Oladapo, DO   25 mg at 08/24/23 1043   levothyroxine (SYNTHROID) tablet 125 mcg  125 mcg Oral Q0600 Adefeso, Oladapo, DO   125 mcg at 08/24/23 0546   metoprolol tartrate (LOPRESSOR) tablet 25 mg  25 mg Oral BID Hughie Closs, MD   25 mg at 08/24/23 1042   multivitamin (RENA-VIT) tablet 1 tablet  1 tablet Oral Daily Adefeso, Oladapo, DO   1 tablet at 08/24/23 1043   prochlorperazine (COMPAZINE) injection 10 mg  10 mg Intravenous Q6H PRN Adefeso, Oladapo, DO   10 mg at 08/20/23 1507   rosuvastatin (CRESTOR) tablet 10 mg  10 mg Oral Daily Adefeso, Oladapo, DO   10 mg at 08/24/23 1042   saccharomyces boulardii (FLORASTOR) capsule 250 mg  250 mg Oral BID Vassie Loll, MD   250 mg at 08/24/23 1049   sodium chloride flush (NS) 0.9 % injection 10-40 mL  10-40 mL  Intracatheter Q12H Dagoberto Ligas, MD   10 mL at 08/24/23 1049   sodium chloride flush (NS) 0.9 % injection 10-40 mL  10-40 mL Intracatheter PRN Dagoberto Ligas, MD         Past Surgical History:  Procedure Laterality Date   A/V FISTULAGRAM Left 10/04/2022   Procedure: A/V Fistulagram;  Surgeon: Renford Dills, MD;  Location: [facility] INVASIVE CV LAB;  Service: Cardiovascular;  Laterality: Left;   A/V SHUNTOGRAM Left 03/19/2019   Procedure: A/V SHUNTOGRAM;  Surgeon: Renford Dills, MD;  Location: [facility] INVASIVE CV LAB;  Service: Cardiovascular;  Laterality: Left;   ABDOMINAL HYSTERECTOMY  1992   APPENDECTOMY  06/1990   AV FISTULA PLACEMENT  07/2009   left upper arm   AV FISTULA PLACEMENT Right 09/06/2016   Procedure: RIGHT FOREARM ARTERIOVENOUS (AV) GRAFT;  Surgeon: Sherren Kerns, MD;  Location: [facility] OR;  Service: Vascular;  Laterality: Right;   AV FISTULA PLACEMENT N/A 02/24/2017   Procedure: INSERTION OF ARTERIOVENOUS (AV) GORE-TEX GRAFT ARM (BRACHIAL AXILLARY);  Surgeon: Renford Dills, MD;  Location: [facility] ORS;  Service: Vascular;  Laterality: N/A;   AVGG REMOVAL Right 09/06/2016   Procedure: REMOVAL OF Right Arm ARTERIOVENOUS GORETEX GRAFT and Vein Patch angioplasty of brachial artery;  Surgeon: Chuck Hint, MD;  Location: [facility] OR;  Service: Vascular;  Laterality: Right;   BIOPSY  09/26/2019   Procedure: BIOPSY;  Surgeon: Malissa Hippo, MD;  [facility];  Service: Endoscopy;;   BIOPSY  11/01/2022   Procedure: BIOPSY;  Surgeon: Dolores Frame, MD;  [facility];  Service: Gastroenterology;;   COLON RESECTION  1992   COLON SURGERY     COLONOSCOPY N/A 03/09/2019   Procedure: COLONOSCOPY;  Surgeon: Malissa Hippo, MD;  [facility];  Service: Endoscopy;  Laterality: N/A;   COLONOSCOPY N/A 08/11/2022   Procedure: COLONOSCOPY;  Surgeon: Beverley Fiedler, MD;  Location: [facility] ENDOSCOPY;  Service: Gastroenterology;  Laterality: N/A;   COLONOSCOPY  WITH PROPOFOL N/A 06/08/2021   Procedure: COLONOSCOPY WITH PROPOFOL;  Surgeon: Dolores Frame, MD;  [facility];  Service: Gastroenterology;  Laterality: N/A;  9:05 /Patient is on dialysis Mon Wed Fri   CORONARY ANGIOPLASTY WITH STENT PLACEMENT  12/15/11   "2"   CORONARY ANGIOPLASTY WITH STENT PLACEMENT  y/2013   "1; makes total of 3" (05/02/2012)   CORONARY ARTERY BYPASS GRAFT  06/13/2012   Procedure: CORONARY ARTERY BYPASS GRAFTING (CABG);  Surgeon: Delight Ovens, MD;  Location: [facility] OR;  Service: Open Heart Surgery;  Laterality: N/A;  cabg x four;  using left internal mammary artery, and left leg greater saphenous vein harvested endoscopically   CORONARY STENT INTERVENTION N/A 10/13/2016   Procedure: Coronary Stent Intervention;  Surgeon: Lennette Bihari, MD;  [facility]: [facility];  Service: Cardiovascular;  Laterality: N/A;   DIALYSIS/PERMA CATHETER REMOVAL N/A 04/18/2017   Procedure: DIALYSIS/PERMA CATHETER REMOVAL;  Surgeon: Renford Dills, MD;  Location: [facility] INVASIVE CV LAB;  Service: Cardiovascular;  Laterality: N/A;   DILATION AND CURETTAGE OF UTERUS     ENTEROSCOPY N/A 06/08/2021   Procedure: PUSH ENTEROSCOPY;  Surgeon: Dolores Frame, MD;  [facility];  Service: Gastroenterology;  Laterality: N/A;   ENTEROSCOPY N/A 11/01/2022   Procedure: ENTEROSCOPY;  Surgeon: Dolores Frame, MD;  [facility];  Service: Gastroenterology;  Laterality: N/A;   ENTEROSCOPY N/A 12/01/2022   Procedure: ENTEROSCOPY;  Surgeon: Benancio Deeds, MD;  Location: [facility] ENDOSCOPY;  Service: Gastroenterology;  Laterality: N/A;   ESOPHAGOGASTRODUODENOSCOPY  01/20/2012   Procedure: ESOPHAGOGASTRODUODENOSCOPY (EGD);  Surgeon: Meryl Dare, MD,FACG;  Location: [facility] ENDOSCOPY;  Service: Endoscopy;  Laterality: N/A;   ESOPHAGOGASTRODUODENOSCOPY N/A 03/26/2013   Procedure: ESOPHAGOGASTRODUODENOSCOPY (EGD);  Surgeon: Hilarie Fredrickson, MD;  Location:  [facility] ENDOSCOPY;  Service: Endoscopy;  Laterality: N/A;  ESOPHAGOGASTRODUODENOSCOPY N/A 04/30/2015   Procedure: ESOPHAGOGASTRODUODENOSCOPY (EGD);  Surgeon: Malissa Hippo, MD;  [facility];  Service: Endoscopy;  Laterality: N/A;  1pm - moved to 10/20 @ 1:10   ESOPHAGOGASTRODUODENOSCOPY N/A 07/29/2016   Procedure: ESOPHAGOGASTRODUODENOSCOPY (EGD);  Surgeon: Ruffin Frederick, MD;  Location: [facility] ENDOSCOPY;  Service: Gastroenterology;  Laterality: N/A;  enteroscopy   ESOPHAGOGASTRODUODENOSCOPY N/A 09/26/2019   Procedure: ESOPHAGOGASTRODUODENOSCOPY (EGD);  Surgeon: Malissa Hippo, MD;  [facility];  Service: Endoscopy;  Laterality: N/A;  1250   ESOPHAGOGASTRODUODENOSCOPY N/A 08/11/2022   Procedure: ESOPHAGOGASTRODUODENOSCOPY (EGD);  Surgeon: Beverley Fiedler, MD;  Location: [facility] ENDOSCOPY;  Service: Gastroenterology;  Laterality: N/A;   ESOPHAGOGASTRODUODENOSCOPY (EGD) WITH PROPOFOL N/A 02/05/2021   Procedure: ESOPHAGOGASTRODUODENOSCOPY (EGD) WITH PROPOFOL;  Surgeon: Lanelle Bal, DO;  [facility];  Service: Endoscopy;  Laterality: N/A;   ESOPHAGOGASTRODUODENOSCOPY (EGD) WITH PROPOFOL N/A 08/27/2022   Procedure: ESOPHAGOGASTRODUODENOSCOPY (EGD) WITH PROPOFOL;  Surgeon: Lanelle Bal, DO;  [facility];  Service: Endoscopy;  Laterality: N/A;   GIVENS CAPSULE STUDY N/A 03/07/2019   Procedure: GIVENS CAPSULE STUDY;  Surgeon: Malissa Hippo, MD;  [facility];  Service: Endoscopy;  Laterality: N/A;  7:30   GIVENS CAPSULE STUDY N/A 04/22/2021   Procedure: GIVENS CAPSULE STUDY;  Surgeon: Malissa Hippo, MD;  [facility];  Service: Endoscopy;  Laterality: N/A;  7:30   GIVENS CAPSULE STUDY N/A 08/27/2022   Procedure: GIVENS CAPSULE STUDY;  Surgeon: Lanelle Bal, DO;  [facility];  Service: Endoscopy;  Laterality: N/A;   HOT HEMOSTASIS N/A 08/11/2022   Procedure: HOT HEMOSTASIS (ARGON PLASMA COAGULATION/BICAP);  Surgeon:  Beverley Fiedler, MD;  Location: [facility] ENDOSCOPY;  Service: Gastroenterology;  Laterality: N/A;   HOT HEMOSTASIS N/A 12/01/2022   Procedure: HOT HEMOSTASIS (ARGON PLASMA COAGULATION/BICAP);  Surgeon: Benancio Deeds, MD;  Location: [facility] ENDOSCOPY;  Service: Gastroenterology;  Laterality: N/A;   INSERTION OF DIALYSIS CATHETER N/A 10/05/2020   Procedure: ABORTED TUNNELED DIALYSIS CATHETER PLACEMENT RIGHT INTERNAL JUGULAR VEIN ;  Surgeon: Lucretia Roers, MD;  Location: [facility];  Service: General;  Laterality: N/A;   INTRAOPERATIVE TRANSESOPHAGEAL ECHOCARDIOGRAM  06/13/2012   Procedure: INTRAOPERATIVE TRANSESOPHAGEAL ECHOCARDIOGRAM;  Surgeon: Delight Ovens, MD;  Location: [facility] OR;  Service: Open Heart Surgery;  Laterality: N/A;   IR DIALY SHUNT INTRO NEEDLE/INTRACATH INITIAL W/IMG LEFT Left 10/06/2020   IR FLUORO GUIDE CV LINE RIGHT  06/17/2020   IR FLUORO GUIDE CV LINE RIGHT  11/12/2022   IR GENERIC HISTORICAL  07/26/2016   IR FLUORO GUIDE CV LINE RIGHT 07/26/2016 Berdine Dance, MD [facility]   IR GENERIC HISTORICAL  07/26/2016   IR US GUIDE VASC ACCESS RIGHT 07/26/2016 Berdine Dance, MD [facility]   IR GENERIC HISTORICAL  08/02/2016   IR US GUIDE VASC ACCESS RIGHT 08/02/2016 Berdine Dance, MD [facility]   IR GENERIC HISTORICAL  08/02/2016   IR FLUORO GUIDE CV LINE RIGHT 08/02/2016 Berdine Dance, MD [facility]   IR RADIOLOGY PERIPHERAL GUIDED IV START  03/28/2017   IR REMOVAL TUN CV CATH W/O FL  08/11/2020   IR REMOVAL TUN CV CATH W/O FL  11/15/2022   IR THROMBECTOMY AV FISTULA W/THROMBOLYSIS INC/SHUNT/IMG LEFT Left 06/17/2020   IR US GUIDE VASC ACCESS LEFT  06/17/2020   IR US GUIDE VASC ACCESS RIGHT  03/28/2017   IR US GUIDE VASC ACCESS RIGHT  06/17/2020   IR US GUIDE VASC ACCESS RIGHT  11/12/2022   LEFT HEART CATH AND CORONARY ANGIOGRAPHY N/A 09/20/2016   Procedure: Left Heart Cath and Coronary Angiography;  Surgeon: Lyn Records, MD;  Location: [facility] INVASIVE CV LAB;  Service: Cardiovascular;   Laterality: N/A;   LEFT HEART CATH AND CORS/GRAFTS ANGIOGRAPHY N/A 10/13/2016   Procedure: Left Heart Cath and Cors/Grafts Angiography;  Surgeon: Lennette Bihari, MD;  Location: [facility] CV LAB;  Service: Cardiovascular;  Laterality: N/A;   LEFT HEART CATH AND CORS/GRAFTS ANGIOGRAPHY N/A 07/13/2018   Procedure: LEFT HEART CATH AND CORS/GRAFTS ANGIOGRAPHY;  Surgeon: Swaziland, Peter M, MD;  Location: [facility] INVASIVE CV LAB;  Service: Cardiovascular;  Laterality: N/A;   LEFT HEART CATH AND CORS/GRAFTS ANGIOGRAPHY N/A 07/22/2021   Procedure: LEFT HEART CATH AND CORS/GRAFTS ANGIOGRAPHY;  Surgeon: Runell Gess, MD;  Location: [facility] INVAS[facility] CV LAB;  Service: Cardiovascular;  Laterality: N/A;   LEFT HEART CATHETERIZATION WITH CORONARY ANGIOGRAM N/A 12/15/2011   Procedure: LEFT HEART CATHETERIZATION WITH CORONARY ANGIOGRAM;  Surgeon: Kathleene Hazel, MD;  Location: [facility] CATH LAB;  Service: Cardiovascular;  Laterality: N/A;   LEFT HEART CATHETERIZATION WITH CORONARY ANGIOGRAM N/A 01/10/2012   Procedure: LEFT HEART CATHETERIZATION WITH CORONARY ANGIOGRAM;  Surgeon: Peter M Swaziland, MD;  Location: [facility] CATH LAB;  Service: Cardiovascular;  Laterality: N/A;   LEFT HEART CATHETERIZATION WITH CORONARY ANGIOGRAM N/A 06/08/2012   Procedure: LEFT HEART CATHETERIZATION WITH CORONARY ANGIOGRAM;  Surgeon: Kathleene Hazel, MD;  Location: [facility] CATH LAB;  Service: Cardiovascular;  Laterality: N/A;   LEFT HEART CATHETERIZATION WITH CORONARY/GRAFT ANGIOGRAM N/A 12/10/2013   Procedure: LEFT HEART CATHETERIZATION WITH Isabel Caprice;  Surgeon: Corky Crafts, MD;  Location: [facility] CATH LAB;  Service: Cardiovascular;  Laterality: N/A;   OVARY SURGERY     ovarian cancer   POLYPECTOMY  03/09/2019   Procedure: POLYPECTOMY;  Surgeon: Malissa Hippo, MD;  [facility];  Service: Endoscopy;;  cecal    POLYPECTOMY N/A 09/26/2019   Procedure: DUODENAL POLYPECTOMY;  Surgeon: Malissa Hippo, MD;  [facility];  Service: Endoscopy;  Laterality: N/A;   POLYPECTOMY  08/11/2022   Procedure: POLYPECTOMY;  Surgeon: Beverley Fiedler, MD;  Location: [facility] ENDOSCOPY;  Service: Gastroenterology;;   REVISION OF ARTERIOVENOUS GORETEX GRAFT N/A 02/24/2017   Procedure: REVISION OF ARTERIOVENOUS GORETEX GRAFT (RESECTION);  Surgeon: Renford Dills, MD;  Location: [facility] ORS;  Service: Vascular;  Laterality: N/A;   REVISON OF ARTERIOVENOUS FISTULA Left 06/19/2020   Procedure: REVISION OF LEFT UPPER ARM AV GRAFT WITH INTERPOSITION JUMP GRAFT USING GORE LIMB;  Surgeon: Cephus Shelling, MD;  Location: [facility] OR;  Service: Vascular;  Laterality: Left;   SHUNTOGRAM N/A 10/15/2013   Procedure: Fistulogram;  Surgeon: Nada Libman, MD;  Location: [facility] CATH LAB;  Service: Cardiovascular;  Laterality: N/A;   SUBMUCOSAL TATTOO INJECTION  11/01/2022   Procedure: SUBMUCOSAL TATTOO INJECTION;  Surgeon: Dolores Frame, MD;  [facility];  Service: Gastroenterology;;   THROMBECTOMY / ARTERIOVENOUS GRAFT REVISION  2011   left upper arm   TUBAL LIGATION  1980's   UPPER EXTREMITY ANGIOGRAPHY Bilateral 12/06/2016   Procedure: Upper Extremity Angiography;  Surgeon: Renford Dills, MD;  Location: [facility] INVASIVE CV LAB;  Service: Cardiovascular;  Laterality: Bilateral;   UPPER EXTREMITY INTERVENTION Left 06/06/2017   Procedure: UPPER EXTREMITY INTERVENTION;  Surgeon: Renford Dills, MD;  Location: [facility] INVASIVE CV LAB;  Service: Cardiovascular;  Laterality: Left;   UPPER EXTREMITY VENOGRAPHY Left 10/04/2022  Procedure: UPPER EXTREMITY VENOGRAPHY;  Surgeon: Renford Dills, MD;  Location: [facility] INVASIVE CV LAB;  Service: Cardiovascular;  Laterality: Left;     Allergies  Allergen Reactions   Amlodipine Swelling   Aspirin Other (See Comments)    High Doses Mess up her stomach; "makes my bowels have blood in them". Takes 81 mg EC Aspirin    Nitrofurantoin Hives   Ranexa [Ranolazine] Other (See Comments)     Myoclonus-hospitalized    Bactrim [Sulfamethoxazole-Trimethoprim] Rash   Iodinated Contrast Media Itching and Other (See Comments)   Iron Itching and Other (See Comments)    "they gave me iron in dialysis; had to give me Benadryl cause I had to have the iron" (05/02/2012)   Tylenol [Acetaminophen] Itching and Other (See Comments)    Makes her feet on fire per pt - can tolerate per pt   Gabapentin Other (See Comments)    Unknown reaction   Sucroferric Oxyhydroxide Other (See Comments)    Unknown   Levaquin [Levofloxacin In D5w] Rash   Levofloxacin Rash and Other (See Comments)   Plavix [Clopidogrel Bisulfate] Rash   Protonix [Pantoprazole Sodium] Rash   Venofer [Ferric Oxide] Itching and Other (See Comments)    Patient reports using Benadryl prior to doses as [facility]      Family History  Problem Relation Age of Onset   Heart disease Mother        Heart Disease before age 45   Hyperlipidemia Mother    Hypertension Mother    Diabetes Mother    Heart attack Mother    Heart disease Father        Heart Disease before age 65   Hyperlipidemia Father    Hypertension Father    Diabetes Father    Diabetes Sister    Hypertension Sister    Diabetes Brother    Hyperlipidemia Brother    Heart attack Brother    Hypertension Sister    Heart attack Brother    Colon cancer Child 22   Other Other        noncontributory for early CAD   Esophageal cancer Neg Hx    Liver disease Neg Hx    Kidney disease Neg Hx    Colon polyps Neg Hx      Social History Shawna [name] reports that she has never smoked. She has never used smokeless tobacco. Shawna [name] reports no history of alcohol use.   Review of Systems CONSTITUTIONAL: No weight loss, fever, chills, weakness or fatigue.  HEENT: Eyes: No visual loss, blurred vision, double vision or yellow sclerae.No hearing loss, sneezing, congestion, runny nose or sore throat.  SKIN: No rash or itching.  CARDIOVASCULAR:  RESPIRATORY: No  shortness of breath, cough or sputum.  GASTROINTESTINAL: No anorexia, nausea, vomiting or diarrhea. No abdominal pain or blood.  GENITOURINARY: No burning on urination, no polyuria NEUROLOGICAL: No headache, dizziness, syncope, paralysis, ataxia, numbness or tingling in the extremities. No change in bowel or bladder control.  MUSCULOSKELETAL: No muscle, back pain, joint pain or stiffness.  LYMPHATICS: No enlarged nodes. No history of splenectomy.  PSYCHIATRIC: No history of depression or anxiety.  ENDOCRINOLOGIC: No reports of sweating, cold or heat intolerance. No polyuria or polydipsia.  Marland Kitchen   Physical Examination There were no vitals filed for this visit. There were no vitals filed for this visit.  Gen: resting comfortably, no acute distress HEENT: no scleral icterus, pupils equal round and reactive, no palptable cervical adenopathy,  CV Resp: Clear  to auscultation bilaterally GI: abdomen is soft, non-tender, non-distended, normal bowel sounds, no hepatosplenomegaly MSK: extremities are warm, no edema.  Skin: warm, no rash Neuro:  no focal deficits Psych: appropriate affect   Diagnostic Studies     Assessment and Plan        Antoine Poche, M.D., F.A.C.C.

## 2023-08-24 NOTE — Discharge Summary (Signed)
Physician Discharge Summary   Patient: Shawna [name] MRN: [id number] DOB: 05-18-1941  Admit date:     08/19/2023  Discharge date: 08/24/23  Discharge Physician: Vassie Loll   PCP: [facility]   Recommendations at discharge:  Repeat CBC to follow Hgb trend/stability Repeat CXR in 6-8 weeks to assure resolution of infiltrates. GOC and advance care planning discussion recommended. Reassess BP and further adjust antihypertensive regimen as needed.  Discharge Diagnoses: Principal Problem:   Multifocal pneumonia Active Problems:   PAF (paroxysmal atrial fibrillation) (HCC)   Diabetes mellitus type 2 in nonobese [facility])   Acquired hypothyroidism   Mixed hyperlipidemia   Chronic combined systolic and diastolic CHF (congestive heart failure) (HCC)   Hypertensive urgency   Elevated troponin   Acute respiratory failure with hypoxia (HCC)   Pulmonary edema   Severe sepsis (HCC)   Pleural effusion   Hypoxia  Brief Hospital admission Course: HPI: Shawna [name] is a 83 y.o. female with medical history significant of ESRD on hemodialysis (MWF), CAD status post CABG, HFrEF, T2DM, hypertension, GERD, atrial fibrillation (not on anticoagulants) who presents to the emergency department with complaint of cough which started today.  She complained of 2 to 3-day onset of not feeling well and complained of body aches, productive cough (unsure of sputum color), subjective fever.  Shortness of breath worsened today, so she presented to the ED for further evaluation and management.   ED Course: In the emergency department, she was tachypneic, tachycardic, BP was 178/80, O2 sat was 89% on room air, this improved to 100% on supplemental oxygen via [place] at 2 LPM.  Temperature 98.6 F.  Workup in the ED showed hemoglobin 8.1, hematocrit 25.3, MCV 114.5.  BMP was normal except for chloride 97, blood glucose 185, creatinine 3.36, GFR 13.  Lactic acid 1.8 > 2.0.  Troponin 89 >129.  Influenza A, B, SARS,  respiratory, RSV was negative. CT chest without contrast showed bibasilar airspace disease and bronchial wall thickening consistent with multifocal bronchopneumonia.  Small free-flowing bilateral pleural effusions, right greater than left. Chest x-ray showed vascular congestion with persistent bibasilar airspace disease and small bilateral pleural effusions.  Overall findings are consistent with pulmonary edema, and/or underlying infection will be difficult to exclude.  No significant change since prior exam Patient was started on IV ceftriaxone and azithromycin, IV hydralazine 10 mg x 1 was given due to elevated BP and breathing treatment was provided. Hospitalist was asked admit patient for further evaluation and management.   Of note, patient was recently hospitalized for 3 days for bradycardia which was asymptomatic, patient's metoprolol and amiodarone was discontinued at discharge on 08/08/2023 per cardiology recommendations.  Assessment and Plan: 1)Severe sepsis and acute hypoxic respiratory failure secondary to multifocal/healthcare associated pneumonia, POA:  PCT 0.44, no leukocytosis -Antibiotics has been transitioned to oral Augmentin to complete therapy. -Azithromycin discontinued due to concerns about QT prolongation -No leukocytosis, no fevers and now with good saturation.   2)Diarrhea: -Patient reports improvement in the amount of episodes -C. Diff stool evaluation demonstrating colonization without acute infection. -Oral vancomycin has been discontinued. -Continue treatment with Florastor and maintain adequate hydration.   3)Acute on chronic combined systolic and diastolic congestive heart failure/volume overload bilateral pleural effusion/acute pulmonary edema/ESRD on HD, POA: -admission Chest x-ray was consistent with pulmonary edema -Patient had elevated troponin in the setting of CHF/demand ischemia and ESRD ----troponin pattern is Not consistent with ACS -Continue to use  hemodialysis to address volume status -Echocardiogram from 08/22/23  with EF of 45 to 50% with grade 2 diastolic dysfunction -Continue volume management with hemodialysis.   4) acute on chronic symptomatic anemia (in the setting of anemia of chronic disease and underlying history of GI bleed).-- Hgb down to 6.2 -Patient with underlying chronic anemia of ESRD compounded by history of GI bleed as well -Case has been discussed with nephrology service; 2 units PRBCs have been transfused. -Hemoglobin currently stable. -Continue IV iron and Epogen therapy as per nephrology discretion. -No active bleeding appreciated throughout this hospitalization.   5)Prolonged QT interval/hypomagnesemia QTc 506 ms  Avoid QT prolonging drugs.  Magnesium is low, will replenish. -Zithromax has been discontinued in the setting of QT prolongation -Continue treatment with Augmentin to complete antibiotic therapy.   6)Hypertensive urgency -resolved -Continue current antihypertensive regimen. -Close follow-up of patient's blood pressure fluctuation with further adjustment to antihypertensive regimen as required.   7)Paroxysmal atrial fibrillation: -No anticoagulation due to history of GI bleed and anemia of ESRD with symptomatic anemia requiring transfusion -Beta-blockers and amiodarone were discontinued by Dr. Jacqulyn Bath during recent hospitalization due to bradycardia -Continue metoprolol with holding parameters. -Will recommend outpatient follow-up with cardiology service.   8) acute hypoxic respiratory failure--multifactorial due to #1, #2 #3 above -Patient no longer requiring oxygen supplementation and demonstrating good saturation on room air -Continue as needed bronchodilator management and complete oral antibiotic therapy.   9)ESRD---c/n HD on MWF, l -Last hemodialysis to 1225 -Resume outpatient hemodialysis schedule.   10)Mixed hyperlipidemia Continue Crestor.   11)Acquired hypothyroidism: Continue  Synthroid.   Consultants: Nephrology service Procedures performed: See below for x-ray reports. Disposition: Home with home health services. Diet recommendation: Heart/low-sodium diet.  DISCHARGE MEDICATION: Allergies as of 08/24/2023       Reactions   Amlodipine Swelling   Aspirin Other (See Comments)   High Doses Mess up her stomach; "makes my bowels have blood in them". Takes 81 mg EC Aspirin    Nitrofurantoin Hives   Ranexa [ranolazine] Other (See Comments)   Myoclonus-hospitalized    Bactrim [sulfamethoxazole-trimethoprim] Rash   Iodinated Contrast Media Itching, Other (See Comments)   Iron Itching, Other (See Comments)   "they gave me iron in dialysis; had to give me Benadryl cause I had to have the iron" (05/02/2012)   Tylenol [acetaminophen] Itching, Other (See Comments)   Makes her feet on fire per pt - can tolerate per pt   Gabapentin Other (See Comments)   Unknown reaction   Sucroferric Oxyhydroxide Other (See Comments)   Unknown   Levaquin [levofloxacin In D5w] Rash   Levofloxacin Rash, Other (See Comments)   Plavix [clopidogrel Bisulfate] Rash   Protonix [pantoprazole Sodium] Rash   Venofer [ferric Oxide] Itching, Other (See Comments)   Patient reports using Benadryl prior to doses as [facility]        Medication List     STOP taking these medications    gabapentin 100 MG capsule Commonly known as: NEURONTIN   omeprazole 40 MG capsule Commonly known as: PRILOSEC       TAKE these medications    amoxicillin-clavulanate 500-125 MG tablet Commonly known as: AUGMENTIN Take 1 tablet by mouth every 12 (twelve) hours for 3 days.   calcitRIOL 0.25 MCG capsule Commonly known as: ROCALTROL Take 7 capsules (1.75 mcg total) by mouth every Monday, Wednesday, and Friday with hemodialysis.   cinacalcet 30 MG tablet Commonly known as: SENSIPAR Take 1 tablet (30 mg total) by mouth every Monday, Wednesday, and Friday with hemodialysis.   cyanocobalamin  1000 MCG/ML injection Commonly known as: VITAMIN B12 Inject 1,000 mcg into the muscle every 30 (thirty) days.   cyclobenzaprine 5 MG tablet Commonly known as: FLEXERIL Take 5 mg by mouth 3 (three) times daily as needed for muscle spasms.   dextromethorphan-guaiFENesin 30-600 MG 12hr tablet Commonly known as: MUCINEX DM Take 1 tablet by mouth 2 (two) times daily for 7 days.   famotidine 20 MG tablet Commonly known as: Pepcid Take 1 tablet (20 mg total) by mouth at bedtime.   folic acid 1 MG tablet Commonly known as: FOLVITE Take 1 mg by mouth daily.   hydrALAZINE 25 MG tablet Commonly known as: APRESOLINE Take 1 tablet (25 mg total) by mouth 2 (two) times daily. Hold for sbp <110   hydrOXYzine 25 MG tablet Commonly known as: ATARAX Take 25 mg by mouth every 6 (six) hours as needed for itching.   levETIRAcetam 500 MG tablet Commonly known as: KEPPRA Take 1 tablet (500 mg total) by mouth daily.   levothyroxine 125 MCG tablet Commonly known as: SYNTHROID Take 1 tablet (125 mcg total) by mouth daily at 6 (six) AM.   metoprolol tartrate 25 MG tablet Commonly known as: LOPRESSOR Take 1 tablet (25 mg total) by mouth 2 (two) times daily.   multivitamin Tabs tablet Take 1 tablet by mouth daily.   nitroGLYCERIN 0.4 MG SL tablet Commonly known as: NITROSTAT Place 0.4 mg under the tongue every 5 (five) minutes as needed for chest pain.   ondansetron 4 MG disintegrating tablet Commonly known as: ZOFRAN-ODT Take 4 mg by mouth every 8 (eight) hours as needed for nausea or vomiting.   rosuvastatin 10 MG tablet Commonly known as: CRESTOR Take 1 tablet by mouth once daily   saccharomyces boulardii 250 MG capsule Commonly known as: FLORASTOR Take 1 capsule (250 mg total) by mouth 2 (two) times daily.   SandoSTATIN LAR [place] 20 MG injection Generic drug: octreotide Inject 20 mg into the muscle every 28 (twenty-eight) days.   UNABLE TO FIND Nugo Milk Chocolate Consume on  non-dialysis days only storage for maximum shelf life, store bars below 75 degrees F. Use immediately upon opening.        Follow-up Information     [facility] Follow up.   Why: Agency will call to set up home visits. Contact information: [address] [place] [address] [phone]         [facility]. Schedule an appointment as soon as possible for a visit in 2 week(s).   Contact information: [address] [place] [address] [phone]                Discharge Exam: Filed Weights   08/23/23 1600 08/23/23 1945 08/24/23 0255  Weight: 48.9 kg 46.8 kg 50.1 kg   General exam: Alert, awake, following commands no acute distress.  Good saturation on room air.  Stable vital signs and feeling ready for discharge.  No nausea or vomiting reported.  Still experiencing intermittent coughing spells but demonstrating good saturation on room air and is speaking in full sentences. Respiratory system: No using accessory muscles.  Good saturation on room air.  Accessory muscles.   Cardiovascular system: Rate controlled, no rubs, no gallops, no murmurs appreciated on exam.  Left IJ hemodialysis catheter in place. Gastrointestinal system: Abdomen is nondistended, soft and nontender. No organomegaly or masses felt. Normal bowel sounds heard. Central nervous system:  No focal neurological deficits. Extremities: No cyanosis or clubbing. Skin:  No petechiae. Psychiatry: Mood & affect appropriate.    Condition at discharge: Stable and improved.  The results of significant diagnostics from this hospitalization (including imaging, microbiology, ancillary and laboratory) are listed below for reference.   Imaging Studies: ECHOCARDIOGRAM COMPLETE Result Date: 08/22/2023    ECHOCARDIOGRAM REPORT   Patient Name:   Shawna [name] Date of Exam: 08/22/2023 Medical Rec #:  [id number]    Height:       61.0 in Accession #:    [id number]   Weight:       103.2 lb Date of  Birth:  [birth year]    BSA:          1.426 m Patient Age:    82 years     BP:           140/51 mmHg Patient Gender: F            HR:           59 bpm. [facility] Procedure: 2D Echo, Color Doppler and Cardiac Doppler Indications:    Elevated Troponin  History:        Patient has prior history of Echocardiogram examinations, most                 recent 07/16/2021. CAD and Previous Myocardial Infarction, Prior                 CABG, Arrythmias:LBBB and Bradycardia; Risk                 Factors:Hypertension, Diabetes and Dyslipidemia. ESRD on                 hemodialysis.  Sonographer:    Celesta Gentile RCS Referring Phys: [id number] RAVI PAHWANI IMPRESSIONS  1. Left ventricular ejection fraction, by estimation, is 45 to 50%. The left ventricle has mildly decreased function. The left ventricle demonstrates regional wall motion abnormalities (see scoring diagram/findings for description). Left ventricular diastolic parameters are consistent with Grade II diastolic dysfunction (pseudonormalization). Elevated left atrial pressure.  2. Right ventricular systolic function is moderately reduced. The right ventricular size is normal. There is moderately elevated pulmonary artery systolic pressure.  3. Left atrial size was moderately dilated.  4. The mitral valve is normal in structure. Mild mitral valve regurgitation. No evidence of mitral stenosis.  5. The aortic valve is tricuspid. There is moderate calcification of the aortic valve. Aortic valve regurgitation is not visualized. No aortic stenosis is present.  6. The inferior vena cava is normal in size with greater than 50% respiratory variability, suggesting right atrial pressure of 3 mmHg. Comparison(s): Changes from prior study are noted. LVEF improved from 30-35% in 2023 to 45-50% now. FINDINGS  Left Ventricle: Left ventricular ejection fraction, by estimation, is 45 to 50%. The left ventricle has mildly decreased function. The left ventricle demonstrates  regional wall motion abnormalities. The left ventricular internal cavity size was normal in size. There is no left ventricular hypertrophy. Left ventricular diastolic parameters are consistent with Grade II diastolic dysfunction (pseudonormalization). Elevated left atrial pressure.  LV Wall Scoring: The anterior septum is akinetic. The antero-lateral wall, inferior septum, and apical lateral segment are hypokinetic. The entire anterior wall, entire inferior wall, posterior wall, and apex are normal. Right Ventricle: The right ventricular size is normal. No increase in right ventricular wall thickness. Right ventricular systolic function is moderately reduced. There is moderately elevated pulmonary artery systolic pressure. The tricuspid regurgitant velocity is 3.28 m/s, and with an  assumed right atrial pressure of 15 mmHg, the estimated right ventricular systolic pressure is 58.0 mmHg. Left Atrium: Left atrial size was moderately dilated. Right Atrium: Right atrial size was normal in size. Pericardium: There is no evidence of pericardial effusion. Mitral Valve: The mitral valve is normal in structure. Mild mitral valve regurgitation. No evidence of mitral valve stenosis. Tricuspid Valve: The tricuspid valve is normal in structure. Tricuspid valve regurgitation is mild . No evidence of tricuspid stenosis. Aortic Valve: The aortic valve is tricuspid. There is moderate calcification of the aortic valve. Aortic valve regurgitation is not visualized. No aortic stenosis is present. Pulmonic Valve: The pulmonic valve was not well visualized. Pulmonic valve regurgitation is trivial. No evidence of pulmonic stenosis. Aorta: The aortic root is normal in size and structure. Venous: The inferior vena cava is normal in size with greater than 50% respiratory variability, suggesting right atrial pressure of 3 mmHg. IAS/Shunts: No atrial level shunt detected by color flow Doppler.  LEFT VENTRICLE PLAX 2D LVIDd:         3.70 cm    Diastology LVIDs:         3.00 cm   LV e' medial:    2.70 cm/s LV PW:         1.60 cm   LV E/e' medial:  56.3 LV IVS:        1.50 cm   LV e' lateral:   4.88 cm/s LVOT diam:     1.70 cm   LV E/e' lateral: 31.1 LV SV:         35 LV SV Index:   25 LVOT Area:     2.27 cm  RIGHT VENTRICLE RV S prime:     5.18 cm/s TAPSE (M-mode): 1.3 cm LEFT ATRIUM             Index        RIGHT ATRIUM           Index LA diam:        4.10 cm 2.88 cm/m   RA Area:     13.90 cm LA Vol (A2C):   74.4 ml 52.19 ml/m  RA Volume:   34.30 ml  24.06 ml/m LA Vol (A4C):   63.5 ml 44.54 ml/m LA Biplane Vol: 68.8 ml 48.26 ml/m  AORTIC VALVE LVOT Vmax:   75.40 cm/s LVOT Vmean:  47.900 cm/s LVOT VTI:    0.156 m  AORTA Ao Root diam: 2.70 cm MITRAL VALVE                  TRICUSPID VALVE MV Area (PHT): 3.99 cm       TR Peak grad:   43.0 mmHg MV Decel Time: 190 msec       TR Vmax:        328.00 cm/s MR Peak grad:    128.6 mmHg MR Mean grad:    83.0 mmHg    SHUNTS MR Vmax:         567.00 cm/s  Systemic VTI:  0.16 m MR Vmean:        431.0 cm/s   Systemic Diam: 1.70 cm MR PISA:         1.57 cm MR PISA Eff ROA: 8 mm MR PISA Radius:  0.50 cm MV E velocity: 152.00 cm/s MV A velocity: 92.00 cm/s MV E/A ratio:  1.65 Vishnu Priya Mallipeddi Electronically signed by Winfield Rast Mallipeddi Signature Date/Time: 08/22/2023/5:11:00 PM    Final    CT Chest  Wo Contrast Result Date: 08/19/2023 CLINICAL DATA:  Left-sided chest pain for 1 day, cough EXAM: CT CHEST WITHOUT CONTRAST TECHNIQUE: Multidetector CT imaging of the chest was performed following the standard protocol without IV contrast. RADIATION DOSE REDUCTION: This exam was performed according to the departmental dose-optimization program which includes automated exposure control, adjustment of the mA and/or kV according to patient size and/or use of iterative reconstruction technique. COMPARISON:  08/19/2023, 10/23/2022 FINDINGS: Cardiovascular: Unenhanced imaging of the heart demonstrates cardiomegaly  without pericardial effusion. Postsurgical changes from prior CABG. Normal caliber of the thoracic aorta. There is extensive atherosclerosis of the aorta and native coronary vessels. Left internal jugular dialysis catheter tip within the superior vena cava at the atriocaval junction. Mediastinum/Nodes: Mediastinal adenopathy, largest lymph node in the precarinal region measuring 15 mm. Thyroid, trachea, and esophagus are unremarkable. Lungs/Pleura: For there are small bilateral pleural effusions, volume estimated less than 500 cc each. There is patchy bibasilar airspace disease, greatest in the right middle and right lower lobes. Bibasilar bronchial wall thickening. No pneumothorax. Upper Abdomen: Bilateral renal atrophy consistent with end-stage renal disease. No acute upper abdominal findings. Musculoskeletal: No acute or destructive bony abnormalities. Severe right shoulder osteoarthritis. Reconstructed images demonstrate no additional findings. IMPRESSION: 1. Bibasilar airspace disease and bronchial wall thickening, consistent with multifocal bronchopneumonia. 2. Small free-flowing bilateral pleural effusions, right greater than left. 3. Mediastinal lymphadenopathy. 4. Cardiomegaly. 5.  Aortic Atherosclerosis (ICD10-I70.0). Electronically Signed   By: Sharlet Salina M.D.   On: 08/19/2023 22:41   DG Chest Portable 1 View Result Date: 08/19/2023 CLINICAL DATA:  Left-sided chest pain, cough EXAM: PORTABLE CHEST 1 VIEW COMPARISON:  08/05/2023 FINDINGS: Single frontal view of the chest demonstrates left internal jugular dialysis catheter, tip overlying the right atrium. Postsurgical changes from CABG. The cardiac silhouette is unremarkable. There is persistent pulmonary vascular congestion. Patchy bibasilar airspace disease and small bilateral pleural effusions are noted, which could reflect edema or infection. No significant change since prior exam. No pneumothorax. No acute bony abnormalities. IMPRESSION: 1.  Vascular congestion, with persistent bibasilar airspace disease and small bilateral pleural effusions. Overall, findings are consistent with pulmonary edema, though underlying infection would be difficult to exclude. No significant change since prior exam. Electronically Signed   By: Sharlet Salina M.D.   On: 08/19/2023 21:42   DG Chest Portable 1 View Result Date: 08/05/2023 CLINICAL DATA:  Fluid overload EXAM: PORTABLE CHEST 1 VIEW COMPARISON:  Chest radiograph dated 05/31/2023 FINDINGS: Lines/tubes: Left internal jugular venous catheter tip projects over the right atrium. Left axillary stent graft. Lungs: Low lung volumes. Diffuse bilateral interstitial opacities. Bibasilar patchy opacities. Pleura: Small bilateral pleural effusions.  No pneumothorax. Heart/mediastinum: Similar enlarged cardiomediastinal silhouette. Bones: Median sternotomy wires are nondisplaced. Degenerative changes of the right shoulder. IMPRESSION: 1. Cardiomegaly with pulmonary edema and small bilateral pleural effusions. 2. Bibasilar patchy opacities, likely atelectasis. Aspiration or pneumonia can be considered in the appropriate clinical setting. Electronically Signed   By: Agustin Cree M.D.   On: 08/05/2023 14:32    Microbiology: Results for orders placed or performed during the hospital encounter of 08/19/23  MRSA Next Gen by PCR, Nasal     Status: None   Collection Time: 08/19/23 12:20 AM   Specimen: Nasal Mucosa; Nasal Swab  Result Value Ref Range Status   MRSA by PCR Next Gen NOT DETECTED NOT DETECTED Final    Comment: (NOTE) The GeneXpert MRSA Assay (FDA approved for NASAL specimens only), is one component of a comprehensive MRSA colonization  surveillance program. It is not intended to diagnose MRSA infection nor to guide or monitor treatment for MRSA infections. Test performance is not FDA approved in patients less than 45 years old. Performed at [facility], [address], [place], [place] [address]   Culture,  blood (Routine x 2)     Status: None   Collection Time: 08/19/23  9:02 PM   Specimen: BLOOD  Result Value Ref Range Status   Specimen Description BLOOD  Final   Special Requests NONE  Final   Culture   Final    NO GROWTH 5 DAYS Performed at [facility], [address], [place], [place] [address]    Report Status 08/24/2023 FINAL  Final  Resp panel by RT-PCR (RSV, Flu A&B, Covid) Anterior Nasal Swab     Status: None   Collection Time: 08/19/23  9:06 PM   Specimen: Anterior Nasal Swab  Result Value Ref Range Status   SARS Coronavirus 2 by RT PCR NEGATIVE NEGATIVE Final    Comment: (NOTE) SARS-CoV-2 target nucleic acids are NOT DETECTED.  The SARS-CoV-2 RNA is generally detectable in upper respiratory specimens during the acute phase of infection. The lowest concentration of SARS-CoV-2 viral copies this assay can detect is 138 copies/mL. A negative result does not preclude SARS-Cov-2 infection and should not be used as the sole basis for treatment or other patient management decisions. A negative result may occur with  improper specimen collection/handling, submission of specimen other than nasopharyngeal swab, presence of viral mutation(s) within the areas targeted by this assay, and inadequate number of viral copies(<138 copies/mL). A negative result must be combined with clinical observations, patient history, and epidemiological information. The expected result is Negative.  Fact Sheet for Patients:  BloggerCourse.com  Fact Sheet for Healthcare Providers:  SeriousBroker.it  This test is no t yet approved or cleared by the [place] FDA and  has been authorized for detection and/or diagnosis of SARS-CoV-2 by FDA under an Emergency Use Authorization (EUA). This EUA will remain  in effect (meaning this test can be used) for the duration of the COVID-19 declaration under Section 564(b)(1) of the Act, 21 U.S.C.section  360bbb-3(b)(1), unless the authorization is terminated  or revoked sooner.       Influenza A by PCR NEGATIVE NEGATIVE Final   Influenza B by PCR NEGATIVE NEGATIVE Final    Comment: (NOTE) The Xpert Xpress SARS-CoV-2/FLU/RSV plus assay is intended as an aid in the diagnosis of influenza from Nasopharyngeal swab specimens and should not be used as a sole basis for treatment. Nasal washings and aspirates are unacceptable for Xpert Xpress SARS-CoV-2/FLU/RSV testing.  Fact Sheet for Patients: BloggerCourse.com  Fact Sheet for Healthcare Providers: SeriousBroker.it  This test is not yet approved or cleared by the [place] FDA and has been authorized for detection and/or diagnosis of SARS-CoV-2 by FDA under an Emergency Use Authorization (EUA). This EUA will remain in effect (meaning this test can be used) for the duration of the COVID-19 declaration under Section 564(b)(1) of the Act, 21 U.S.C. section 360bbb-3(b)(1), unless the authorization is terminated or revoked.     Resp Syncytial Virus by PCR NEGATIVE NEGATIVE Final    Comment: (NOTE) Fact Sheet for Patients: BloggerCourse.com  Fact Sheet for Healthcare Providers: SeriousBroker.it  This test is not yet approved or cleared by the [place] FDA and has been authorized for detection and/or diagnosis of SARS-CoV-2 by FDA under an Emergency Use Authorization (EUA). This EUA will remain in effect (meaning this test  can be used) for the duration of the COVID-19 declaration under Section 564(b)(1) of the Act, 21 U.S.C. section 360bbb-3(b)(1), unless the authorization is terminated or revoked.  Performed at [facility], [address] [place], [place] [address]   Culture, blood (Routine x 2)     Status: None   Collection Time: 08/19/23 10:47 PM   Specimen: BLOOD RIGHT HAND  Result Value Ref Range Status   Specimen  Description BLOOD RIGHT HAND  Final   Special Requests NONE  Final   Culture   Final    NO GROWTH 5 DAYS Performed at [facility], [address] [place], [place] [address]    Report Status 08/24/2023 FINAL  Final  C Difficile Quick Screen w PCR reflex     Status: Abnormal   Collection Time: 08/20/23 10:17 AM   Specimen: STOOL  Result Value Ref Range Status   C Diff antigen POSITIVE (A) NEGATIVE Final   C Diff toxin NEGATIVE NEGATIVE Final   C Diff interpretation Results are indeterminate. See PCR results.  Final    Comment: Performed at [facility], [address] [place], [place] [address]  C. Diff by PCR, Reflexed     Status: None   Collection Time: 08/20/23 10:17 AM  Result Value Ref Range Status   Toxigenic C. Difficile by PCR NEGATIVE NEGATIVE Final    Comment: Patient is colonized with non toxigenic C. difficile. May not need treatment unless significant symptoms are present. Performed at [facility], [address], [place], [place] [address]    *Note: Due to a large number of results and/or encounters for the requested time period, some results have not been displayed. A complete set of results can be found in Results Review.    Labs: CBC: Recent Labs  Lab 08/19/23 2056 08/20/23 0407 08/21/23 1713 08/23/23 0424  WBC 6.8 7.0 4.5 3.3*  HGB 8.1* 7.1* 6.2* 9.8*  HCT 25.3* 22.3* 19.5* 29.7*  MCV 114.5* 116.1* 118.2* 103.1*  PLT 230 193 177 179   Basic Metabolic Panel: Recent Labs  Lab 08/19/23 2056 08/20/23 0407 08/21/23 1713 08/23/23 0424  NA 138 136 135 133*  K 3.5 3.4* 3.9 3.3*  CL 97* 97* 97* 98  CO2 29 25 26 22   GLUCOSE 185* 163* 107* 105*  BUN 22 26* 29* 23  CREATININE 3.36* 3.78* 4.37* 4.05*  CALCIUM 9.2 8.9 8.7* 8.1*  MG  --  1.4*  --   --   PHOS  --  3.2 4.5  --    Liver Function Tests: Recent Labs  Lab 08/20/23 0407 08/21/23 1713  AST 34  --   ALT 19  --   ALKPHOS 140*  --   BILITOT 1.0  --   PROT 6.6  --   ALBUMIN 2.9* 3.0*    CBG: No results for input(s): "GLUCAP" in the last 168 hours.  Discharge time spent: greater than 30 minutes.  Signed: Vassie Loll, MD [facility] 08/24/2023

## 2023-08-24 NOTE — Progress Notes (Signed)
Nephrology Follow-Up Consult note   Assessment/Recommendations: Shawna [name] is a/an 83 y.o. female with a past medical history significant for ESRD, CAD, DM 2, HTN, admitted for pneumonia    ESRD  -outpatient HD orders: [name].  4 hours.  EDW 51 kg.  TDC.  Nepro 17 H Elisio.  Flow rates: 300/500.  2K/2.5 calcium.  Heparin: Only heparin locks for TDC.  Meds: Calcitriol 0.75 mcg 3 times a week, ID PN, Mircera 200 mcg every 2 weeks (due 2/10 and given) -continue dialysis on MWF schedule   Severe sepsis Multifocal PNA, HCAP -per primary service   Acute on chronic combined systolic and diastolic CHF exacerbation/HTN -UF as tolerated with HD. Appears euvolemic -continue home BP meds    Anemia of Chronic Kidney Disease -S/p transfusion and has received ESA -Transfuse PRN for Hgb <7   Secondary Hyperparathyroidism/Hyperphosphatemia - resume home meds, calctriol resumed (calcium corrects near normal), monitor phos recently at goal   Elevated troponin -thought to be secondary to demand ischemia secondary to sepsis. Per primary   Paroxysmal Afib -not on a/c given history of anemia/GIB -per primary  Diarrhea: c dif pcr neg. Per primary   Recommendations conveyed to primary service.    [name] 08/24/2023 9:11 AM  ___________________________________________________________  CC: shortness of breath  Interval History/Subjective: Patient continues to have diarrhea. Feels like she has a bowel movement every time after she eats. No issues with HD.   Medications:  Current Facility-Administered Medications  Medication Dose Route Frequency Provider Last Rate Last Admin   0.9 %  sodium chloride infusion (Manually program via Guardrails IV Fluids)   Intravenous Once Hughie Closs, MD   Paused at 08/20/23 1236   acetaminophen (TYLENOL) tablet 650 mg  650 mg Oral Q6H PRN Adefeso, Oladapo, DO   650 mg at 08/23/23 2148   alum & mag  hydroxide-simeth (MAALOX/MYLANTA) 200-200-20 MG/5ML suspension 15 mL  15 mL Oral Q4H PRN Emokpae, Courage, MD       amoxicillin-clavulanate (AUGMENTIN) 500-125 MG per tablet 1 tablet  1 tablet Oral Q12H Vassie Loll, MD       calcitRIOL (ROCALTROL) capsule 0.75 mcg  0.75 mcg Oral Q M,W,F-HD Anthony Sar, MD   0.75 mcg at 08/23/23 1855   Chlorhexidine Gluconate Cloth 2 % PADS 6 each  6 each Topical Q0600 Adefeso, Oladapo, DO   6 each at 08/24/23 0644   Chlorhexidine Gluconate Cloth 2 % PADS 6 each  6 each Topical Q0600 Dagoberto Ligas, MD   6 each at 08/24/23 [id number]   cinacalcet (SENSIPAR) tablet 30 mg  30 mg Oral Q M,W,F-HD Adefeso, Oladapo, DO   30 mg at 08/23/23 1855   Darbepoetin Alfa (ARANESP) injection 150 mcg  150 mcg Subcutaneous Q Mon-1800 Anthony Sar, MD   150 mcg at 08/21/23 2040   dextromethorphan-guaiFENesin (MUCINEX DM) 30-600 MG per 12 hr tablet 1 tablet  1 tablet Oral BID Adefeso, Oladapo, DO   1 tablet at 08/23/23 2148   enoxaparin (LOVENOX) injection 30 mg  30 mg Subcutaneous Q24H Adefeso, Oladapo, DO   30 mg at 08/23/23 0933   guaiFENesin-dextromethorphan (ROBITUSSIN DM) 100-10 MG/5ML syrup 5 mL  5 mL Oral Q4H PRN Adefeso, Oladapo, DO   5 mL at 08/23/23 2152   heparin injection 1,000 Units  1,000 Units Intracatheter PRN Dagoberto Ligas, MD   3,800 Units at 08/23/23 1945   hydrALAZINE (APRESOLINE) tablet 25 mg  25 mg Oral BID Frankey Shown,  DO   25 mg at 08/23/23 2147   levothyroxine (SYNTHROID) tablet 125 mcg  125 mcg Oral Q0600 AdefesoMyrtha Mantis, DO   125 mcg at 08/24/23 0546   metoprolol tartrate (LOPRESSOR) tablet 25 mg  25 mg Oral BID Hughie Closs, MD   25 mg at 08/23/23 2148   multivitamin (RENA-VIT) tablet 1 tablet  1 tablet Oral Daily Adefeso, Oladapo, DO   1 tablet at 08/23/23 0933   prochlorperazine (COMPAZINE) injection 10 mg  10 mg Intravenous Q6H PRN Adefeso, Oladapo, DO   10 mg at 08/20/23 1507   rosuvastatin (CRESTOR) tablet 10 mg  10 mg Oral Daily Adefeso, Oladapo, DO    10 mg at 08/23/23 2952   saccharomyces boulardii (FLORASTOR) capsule 250 mg  250 mg Oral BID Vassie Loll, MD   250 mg at 08/23/23 2147   sodium chloride flush (NS) 0.9 % injection 10-40 mL  10-40 mL Intracatheter Q12H Dagoberto Ligas, MD   10 mL at 08/23/23 2353   sodium chloride flush (NS) 0.9 % injection 10-40 mL  10-40 mL Intracatheter PRN Dagoberto Ligas, MD          Review of Systems: 10 systems reviewed and negative except per interval history/subjective  Physical Exam: Vitals:   08/23/23 2148 08/24/23 0255  BP: (!) 156/57 (!) 133/50  Pulse: 66 (!) 52  Resp:    Temp:  98.1 F (36.7 C)  SpO2:  96%   No intake/output data recorded.  Intake/Output Summary (Last 24 hours) at 08/24/2023 8413 Last data filed at 08/23/2023 2353 Gross per 24 hour  Intake 360 ml  Output 2200 ml  Net -1840 ml   Constitutional: elderly but well-appearing, no acute distress ENMT: ears and nose without scars or lesions, MMM CV: normal rate, no edema Respiratory: bilateral chest rise, normal work of breathing Gastrointestinal: soft, non-tender, no palpable masses or hernias Skin: no visible lesions or rashes Psych: alert, judgement/insight appropriate, appropriate mood and affect   Test Results I personally reviewed new and old clinical labs and radiology tests Lab Results  Component Value Date   NA 133 (L) 08/23/2023   K 3.3 (L) 08/23/2023   CL 98 08/23/2023   CO2 22 08/23/2023   BUN 23 08/23/2023   CREATININE 4.05 (H) 08/23/2023   CALCIUM 8.1 (L) 08/23/2023   ALBUMIN 3.0 (L) 08/21/2023   PHOS 4.5 08/21/2023    CBC Recent Labs  Lab 08/20/23 0407 08/21/23 1713 08/23/23 0424  WBC 7.0 4.5 3.3*  HGB 7.1* 6.2* 9.8*  HCT 22.3* 19.5* 29.7*  MCV 116.1* 118.2* 103.1*  PLT 193 177 179

## 2023-09-08 ENCOUNTER — Emergency Department (HOSPITAL_COMMUNITY): Payer: Medicare Other

## 2023-09-08 ENCOUNTER — Encounter (HOSPITAL_COMMUNITY): Payer: Self-pay

## 2023-09-08 ENCOUNTER — Other Ambulatory Visit: Payer: Self-pay

## 2023-09-08 ENCOUNTER — Inpatient Hospital Stay (HOSPITAL_COMMUNITY)
Admission: EM | Admit: 2023-09-08 | Discharge: 2023-09-10 | DRG: 308 | Disposition: A | Discharge status: Home / Self Care | Payer: Medicare Other | Attending: Family Medicine | Admitting: Family Medicine

## 2023-09-08 DIAGNOSIS — E039 Hypothyroidism, unspecified: Secondary | ICD-10-CM | POA: Diagnosis present

## 2023-09-08 DIAGNOSIS — Z881 Allergy status to other antibiotic agents status: Secondary | ICD-10-CM

## 2023-09-08 DIAGNOSIS — Z8543 Personal history of malignant neoplasm of ovary: Secondary | ICD-10-CM | POA: Diagnosis not present

## 2023-09-08 DIAGNOSIS — I5042 Chronic combined systolic (congestive) and diastolic (congestive) heart failure: Secondary | ICD-10-CM | POA: Diagnosis present

## 2023-09-08 DIAGNOSIS — F419 Anxiety disorder, unspecified: Secondary | ICD-10-CM | POA: Diagnosis present

## 2023-09-08 DIAGNOSIS — E782 Mixed hyperlipidemia: Secondary | ICD-10-CM | POA: Diagnosis present

## 2023-09-08 DIAGNOSIS — J42 Unspecified chronic bronchitis: Secondary | ICD-10-CM | POA: Diagnosis present

## 2023-09-08 DIAGNOSIS — Z79890 Hormone replacement therapy (postmenopausal): Secondary | ICD-10-CM | POA: Diagnosis not present

## 2023-09-08 DIAGNOSIS — G9341 Metabolic encephalopathy: Secondary | ICD-10-CM | POA: Diagnosis present

## 2023-09-08 DIAGNOSIS — Z8701 Personal history of pneumonia (recurrent): Secondary | ICD-10-CM

## 2023-09-08 DIAGNOSIS — N186 End stage renal disease: Secondary | ICD-10-CM | POA: Diagnosis present

## 2023-09-08 DIAGNOSIS — Z8249 Family history of ischemic heart disease and other diseases of the circulatory system: Secondary | ICD-10-CM

## 2023-09-08 DIAGNOSIS — Z886 Allergy status to analgesic agent status: Secondary | ICD-10-CM

## 2023-09-08 DIAGNOSIS — Z800 Family history of malignant neoplasm of digestive organs: Secondary | ICD-10-CM

## 2023-09-08 DIAGNOSIS — D509 Iron deficiency anemia, unspecified: Secondary | ICD-10-CM | POA: Diagnosis present

## 2023-09-08 DIAGNOSIS — E8770 Fluid overload, unspecified: Secondary | ICD-10-CM

## 2023-09-08 DIAGNOSIS — Z91041 Radiographic dye allergy status: Secondary | ICD-10-CM

## 2023-09-08 DIAGNOSIS — E1122 Type 2 diabetes mellitus with diabetic chronic kidney disease: Secondary | ICD-10-CM | POA: Diagnosis present

## 2023-09-08 DIAGNOSIS — K5520 Angiodysplasia of colon without hemorrhage: Secondary | ICD-10-CM | POA: Diagnosis present

## 2023-09-08 DIAGNOSIS — Z955 Presence of coronary angioplasty implant and graft: Secondary | ICD-10-CM | POA: Diagnosis not present

## 2023-09-08 DIAGNOSIS — R531 Weakness: Secondary | ICD-10-CM | POA: Diagnosis present

## 2023-09-08 DIAGNOSIS — G9340 Encephalopathy, unspecified: Secondary | ICD-10-CM

## 2023-09-08 DIAGNOSIS — I132 Hypertensive heart and chronic kidney disease with heart failure and with stage 5 chronic kidney disease, or end stage renal disease: Secondary | ICD-10-CM | POA: Diagnosis present

## 2023-09-08 DIAGNOSIS — Z8711 Personal history of peptic ulcer disease: Secondary | ICD-10-CM

## 2023-09-08 DIAGNOSIS — I2510 Atherosclerotic heart disease of native coronary artery without angina pectoris: Secondary | ICD-10-CM | POA: Diagnosis present

## 2023-09-08 DIAGNOSIS — I480 Paroxysmal atrial fibrillation: Secondary | ICD-10-CM | POA: Diagnosis present

## 2023-09-08 DIAGNOSIS — Z992 Dependence on renal dialysis: Secondary | ICD-10-CM

## 2023-09-08 DIAGNOSIS — Z951 Presence of aortocoronary bypass graft: Secondary | ICD-10-CM | POA: Diagnosis not present

## 2023-09-08 DIAGNOSIS — Z833 Family history of diabetes mellitus: Secondary | ICD-10-CM

## 2023-09-08 DIAGNOSIS — R001 Bradycardia, unspecified: Secondary | ICD-10-CM | POA: Diagnosis not present

## 2023-09-08 DIAGNOSIS — I482 Chronic atrial fibrillation, unspecified: Secondary | ICD-10-CM | POA: Diagnosis not present

## 2023-09-08 DIAGNOSIS — E119 Type 2 diabetes mellitus without complications: Secondary | ICD-10-CM

## 2023-09-08 DIAGNOSIS — Z83438 Family history of other disorder of lipoprotein metabolism and other lipidemia: Secondary | ICD-10-CM | POA: Diagnosis not present

## 2023-09-08 DIAGNOSIS — Z888 Allergy status to other drugs, medicaments and biological substances status: Secondary | ICD-10-CM

## 2023-09-08 DIAGNOSIS — Z882 Allergy status to sulfonamides status: Secondary | ICD-10-CM

## 2023-09-08 DIAGNOSIS — Z90710 Acquired absence of both cervix and uterus: Secondary | ICD-10-CM

## 2023-09-08 DIAGNOSIS — Z8673 Personal history of transient ischemic attack (TIA), and cerebral infarction without residual deficits: Secondary | ICD-10-CM

## 2023-09-08 DIAGNOSIS — Z85038 Personal history of other malignant neoplasm of large intestine: Secondary | ICD-10-CM | POA: Diagnosis not present

## 2023-09-08 DIAGNOSIS — Z79899 Other long term (current) drug therapy: Secondary | ICD-10-CM

## 2023-09-08 DIAGNOSIS — K219 Gastro-esophageal reflux disease without esophagitis: Secondary | ICD-10-CM | POA: Diagnosis present

## 2023-09-08 LAB — CBC WITH DIFFERENTIAL/PLATELET
Abs Immature Granulocytes: 0 10*3/uL (ref 0.00–0.07)
Band Neutrophils: 2 %
Basophils Absolute: 0.3 10*3/uL — ABNORMAL HIGH (ref 0.0–0.1)
Basophils Relative: 5 %
Eosinophils Absolute: 0.1 10*3/uL (ref 0.0–0.5)
Eosinophils Relative: 2 %
HCT: 27.1 % — ABNORMAL LOW (ref 36.0–46.0)
Hemoglobin: 8.3 g/dL — ABNORMAL LOW (ref 12.0–15.0)
Lymphocytes Relative: 11 %
Lymphs Abs: 0.6 10*3/uL — ABNORMAL LOW (ref 0.7–4.0)
MCH: 33.3 pg (ref 26.0–34.0)
MCHC: 30.6 g/dL (ref 30.0–36.0)
MCV: 108.8 fL — ABNORMAL HIGH (ref 80.0–100.0)
Monocytes Absolute: 0.4 10*3/uL (ref 0.1–1.0)
Monocytes Relative: 7 %
Neutro Abs: 3.8 10*3/uL (ref 1.7–7.7)
Neutrophils Relative %: 73 %
Platelets: 206 10*3/uL (ref 150–400)
RBC: 2.49 MIL/uL — ABNORMAL LOW (ref 3.87–5.11)
RDW: 25.3 % — ABNORMAL HIGH (ref 11.5–15.5)
Smear Review: ADEQUATE
WBC: 5.1 10*3/uL (ref 4.0–10.5)
nRBC: 0 % (ref 0.0–0.2)

## 2023-09-08 LAB — RENAL FUNCTION PANEL
Albumin: 2.9 g/dL — ABNORMAL LOW (ref 3.5–5.0)
Anion gap: 15 (ref 5–15)
BUN: 26 mg/dL — ABNORMAL HIGH (ref 8–23)
CO2: 24 mmol/L (ref 22–32)
Calcium: 8.4 mg/dL — ABNORMAL LOW (ref 8.9–10.3)
Chloride: 101 mmol/L (ref 98–111)
Creatinine, Ser: 4.59 mg/dL — ABNORMAL HIGH (ref 0.44–1.00)
GFR, Estimated: 9 mL/min — ABNORMAL LOW (ref >60)
Glucose, Bld: 135 mg/dL — ABNORMAL HIGH (ref 70–99)
Phosphorus: 2.3 mg/dL — ABNORMAL LOW (ref 2.5–4.6)
Potassium: 3.2 mmol/L — ABNORMAL LOW (ref 3.5–5.1)
Sodium: 140 mmol/L (ref 135–145)

## 2023-09-08 LAB — BASIC METABOLIC PANEL
Anion gap: 12 (ref 5–15)
BUN: 26 mg/dL — ABNORMAL HIGH (ref 8–23)
CO2: 26 mmol/L (ref 22–32)
Calcium: 8.1 mg/dL — ABNORMAL LOW (ref 8.9–10.3)
Chloride: 103 mmol/L (ref 98–111)
Creatinine, Ser: 4.6 mg/dL — ABNORMAL HIGH (ref 0.44–1.00)
GFR, Estimated: 9 mL/min — ABNORMAL LOW (ref >60)
Glucose, Bld: 145 mg/dL — ABNORMAL HIGH (ref 70–99)
Potassium: 3.2 mmol/L — ABNORMAL LOW (ref 3.5–5.1)
Sodium: 141 mmol/L (ref 135–145)

## 2023-09-08 LAB — BLOOD GAS, VENOUS
Acid-Base Excess: 4 mmol/L — ABNORMAL HIGH (ref 0.0–2.0)
Bicarbonate: 30.6 mmol/L — ABNORMAL HIGH (ref 20.0–28.0)
Drawn by: 51797
O2 Saturation: 35.3 %
Patient temperature: 36.7
pCO2, Ven: 52 mm[Hg] (ref 44–60)
pH, Ven: 7.37 (ref 7.25–7.43)
pO2, Ven: <31 mm[Hg] — CL (ref 32–45)

## 2023-09-08 MED ORDER — LEVETIRACETAM 500 MG PO TABS
500.0000 mg | ORAL_TABLET | Freq: Every day | ORAL | Status: DC
  Administered 2023-09-09 – 2023-09-10 (×2): 500 mg via ORAL
  Filled 2023-09-08 (×2): qty 1

## 2023-09-08 MED ORDER — RENA-VITE PO TABS
1.0000 | ORAL_TABLET | Freq: Every day | ORAL | Status: DC
  Administered 2023-09-09 – 2023-09-10 (×2): 1 via ORAL
  Filled 2023-09-08 (×2): qty 1

## 2023-09-08 MED ORDER — CHLORHEXIDINE GLUCONATE CLOTH 2 % EX PADS
6.0000 | MEDICATED_PAD | Freq: Every day | CUTANEOUS | Status: DC
  Administered 2023-09-08 – 2023-09-10 (×4): 6 via TOPICAL

## 2023-09-08 MED ORDER — CHLORHEXIDINE GLUCONATE CLOTH 2 % EX PADS
6.0000 | MEDICATED_PAD | Freq: Every day | CUTANEOUS | Status: DC
  Administered 2023-09-09: 6 via TOPICAL

## 2023-09-08 MED ORDER — LEVOTHYROXINE SODIUM 50 MCG PO TABS
50.0000 ug | ORAL_TABLET | Freq: Every day | ORAL | Status: DC
  Administered 2023-09-09 – 2023-09-10 (×2): 50 ug via ORAL
  Filled 2023-09-08 (×2): qty 2

## 2023-09-08 MED ORDER — ROSUVASTATIN CALCIUM 10 MG PO TABS
10.0000 mg | ORAL_TABLET | Freq: Every day | ORAL | Status: DC
  Administered 2023-09-09 – 2023-09-10 (×2): 10 mg via ORAL
  Filled 2023-09-08 (×2): qty 1

## 2023-09-08 MED ORDER — HYDRALAZINE HCL 20 MG/ML IJ SOLN: 5.0000 mg | INTRAMUSCULAR | Status: DC | PRN

## 2023-09-08 MED ORDER — CALCITRIOL 0.25 MCG PO CAPS: 1.7500 ug | ORAL_CAPSULE | ORAL | Status: DC

## 2023-09-08 MED ORDER — ONDANSETRON 4 MG PO TBDP: 4.0000 mg | ORAL_TABLET | Freq: Three times a day (TID) | ORAL | Status: DC | PRN

## 2023-09-08 MED ORDER — CINACALCET HCL 30 MG PO TABS
30.0000 mg | ORAL_TABLET | ORAL | Status: DC
  Filled 2023-09-08: qty 1

## 2023-09-08 MED ORDER — ALBUTEROL SULFATE (2.5 MG/3ML) 0.083% IN NEBU: 2.5000 mg | INHALATION_SOLUTION | RESPIRATORY_TRACT | Status: DC | PRN

## 2023-09-08 MED ORDER — NITROGLYCERIN 0.4 MG SL SUBL: 0.4000 mg | SUBLINGUAL_TABLET | SUBLINGUAL | Status: DC | PRN

## 2023-09-08 MED ORDER — ATROPINE SULFATE 1 MG/10ML IJ SOSY: 1.0000 mg | PREFILLED_SYRINGE | INTRAMUSCULAR | Status: DC | PRN

## 2023-09-08 NOTE — H&P (Signed)
 [facility] H&P   Patient Demographics:    Shawna [name], is a 83 y.o. female  MRN: [id number]   DOB - 01-03-1941  Admit Date - 09/08/2023  Outpatient Primary MD for the patient is [facility]  Referring MD/NP/PA: Dr. Charm Barges  Patient coming from: Home  Chief Complaint  Patient presents with   Weakness      HPI:    Shawna [name]  is a 83 y.o. female,  with medical history significant of ESRD on hemodialysis (MWF), CAD status post CABG, HFrEF, T2DM, hypertension, GERD, atrial fibrillation (not on anticoagulants), patient is well know to our service, given multiple admissions in the past, he is extremely frail, deconditioned, with poor baseline requiring multiple admissions in the past for different reasons.  -Jent was brought to ED by her husband for weakness, her husband apparently patient over last few hemodialysis sessions were not getting any fluid removals due to her soft blood pressure, as well she has not been able to finish a full session of treatment, she has been very weak, somnolent today where she could not go to dialysis so he brought her to ED, she is herself somnolent currently, very poor historian, but she does report generalized weakness, she denies any pain, fever, chills. -In ED her workup significant for bradycardia, she was noted to be sinus bradycardia on EKG at 37 bpm, apparently has been still giving her amiodarone even so it was discontinued on the past, and she is still on metoprolol scheduled while supposed to be with holding parameters as well, CT head with no acute findings, labs significant for potassium 3.2, BUN of 26, hemoglobin at 8.3, x-ray with some evidence of volume overload, given her somnolent, bradycardia and volume overload [facility] hospitalist consulted to admit.    Review of systems:      A full 10 point Review of Systems was done, except as  stated above, all other Review of Systems were negative.   With Past History of the following :    Past Medical History:  Diagnosis Date   Acute on chronic respiratory failure with hypoxia (HCC) 10/10/2016   Anxiety    Arthritis    AVM (arteriovenous malformation) of colon    CAD (coronary artery disease)    a. s/p CABG in 2013 b. DES to D1 in 10/2016. c. cath in 07/2018 showing patent grafts with occlusion of D1 at prior stent site and progression of PDA disease --> medical management recommended   Carotid artery disease (HCC)    a. 60-79% LICA, 03/2012    Chronic bronchitis (HCC)    Chronic HFrEF (heart failure with reduced ejection fraction) (HCC)    Colon cancer (HCC) 1992   Esophageal stricture    ESRD on hemodialysis (HCC)    ESRD due to HTN, started dialysis 2011 and gets HD at [facility] with Dr Fausto Skillern on MWF schedule.  Access  is LUA AVF as of Sept 2014.    GERD (gastroesophageal reflux disease)    High cholesterol 12/2011   History of blood transfusion 07/2011; 12/2011; 01/2012 X 2; 04/2012   History of gout    History of lower GI bleeding    Hypertension    Iron deficiency anemia    Jugular vein occlusion, right (HCC)    Mitral regurgitation    a. Moderate by echo, 02/2012   Mitral valve disease    NSVT (nonsustained ventricular tachycardia) (HCC)    Ovarian cancer (HCC) 1992   PAF (paroxysmal atrial fibrillation) (HCC)    Pneumonia ~ 2009   PUD (peptic ulcer disease)    TIA (transient ischemic attack)    Tricuspid valve disease       Past Surgical History:  Procedure Laterality Date   A/V FISTULAGRAM Left 10/04/2022   Procedure: A/V Fistulagram;  Surgeon: Renford Dills, MD;  Location: [facility] INVASIVE CV LAB;  Service: Cardiovascular;  Laterality: Left;   A/V SHUNTOGRAM Left 03/19/2019   Procedure: A/V SHUNTOGRAM;  Surgeon: Renford Dills, MD;  Location: [facility] INVASIVE CV LAB;  Service: Cardiovascular;  Laterality: Left;   ABDOMINAL HYSTERECTOMY  1992    APPENDECTOMY  06/1990   AV FISTULA PLACEMENT  07/2009   left upper arm   AV FISTULA PLACEMENT Right 09/06/2016   Procedure: RIGHT FOREARM ARTERIOVENOUS (AV) GRAFT;  Surgeon: Sherren Kerns, MD;  [facility]: [facility] OR;  Service: Vascular;  Laterality: Right;   AV FISTULA PLACEMENT N/A 02/24/2017   Procedure: INSERTION OF ARTERIOVENOUS (AV) GORE-TEX GRAFT ARM (BRACHIAL AXILLARY);  Surgeon: Renford Dills, MD;  Location: [facility] ORS;  Service: Vascular;  Laterality: N/A;   AVGG REMOVAL Right 09/06/2016   Procedure: REMOVAL OF Right Arm ARTERIOVENOUS GORETEX GRAFT and Vein Patch angioplasty of brachial artery;  Surgeon: Chuck Hint, MD;  Location: [facility] OR;  Service: Vascular;  Laterality: Right;   BIOPSY  09/26/2019   Procedure: BIOPSY;  Surgeon: Malissa Hippo, MD;  [facility];  Service: Endoscopy;;   BIOPSY  11/01/2022   Procedure: BIOPSY;  Surgeon: Dolores Frame, MD;  [facility];  Service: Gastroenterology;;   COLON RESECTION  1992   COLON SURGERY     COLONOSCOPY N/A 03/09/2019   Procedure: COLONOSCOPY;  Surgeon: Malissa Hippo, MD;  [facility];  Service: Endoscopy;  Laterality: N/A;   COLONOSCOPY N/A 08/11/2022   Procedure: COLONOSCOPY;  Surgeon: Beverley Fiedler, MD;  Location: [facility] ENDOSCOPY;  Service: Gastroenterology;  Laterality: N/A;   COLONOSCOPY WITH PROPOFOL N/A 06/08/2021   Procedure: COLONOSCOPY WITH PROPOFOL;  Surgeon: Dolores Frame, MD;  [facility];  Service: Gastroenterology;  Laterality: N/A;  9:05 /Patient is on dialysis Mon Wed Fri   CORONARY ANGIOPLASTY WITH STENT PLACEMENT  12/15/11   "2"   CORONARY ANGIOPLASTY WITH STENT PLACEMENT  y/2013   "1; makes total of 3" (05/02/2012)   CORONARY ARTERY BYPASS GRAFT  06/13/2012   Procedure: CORONARY ARTERY BYPASS GRAFTING (CABG);  Surgeon: Delight Ovens, MD;  Location: [facility] OR;  Service: Open Heart Surgery;  Laterality: N/A;  cabg x four;  using left internal  mammary artery, and left leg greater saphenous vein harvested endoscopically   CORONARY STENT INTERVENTION N/A 10/13/2016   Procedure: Coronary Stent Intervention;  Surgeon: Lennette Bihari, MD;  [facility];  Service: Cardiovascular;  Laterality: N/A;   DIALYSIS/PERMA CATHETER REMOVAL N/A 04/18/2017   Procedure: DIALYSIS/PERMA  CATHETER REMOVAL;  Surgeon: Renford Dills, MD;  Location: [facility] INVASIVE CV LAB;  Service: Cardiovascular;  Laterality: N/A;   DILATION AND CURETTAGE OF UTERUS     ENTEROSCOPY N/A 06/08/2021   Procedure: PUSH ENTEROSCOPY;  Surgeon: Dolores Frame, MD;  [facility];  Service: Gastroenterology;  Laterality: N/A;   ENTEROSCOPY N/A 11/01/2022   Procedure: ENTEROSCOPY;  Surgeon: Dolores Frame, MD;  [facility];  Service: Gastroenterology;  Laterality: N/A;   ENTEROSCOPY N/A 12/01/2022   Procedure: ENTEROSCOPY;  Surgeon: Benancio Deeds, MD;  Location: [facility] ENDOSCOPY;  Service: Gastroenterology;  Laterality: N/A;   ESOPHAGOGASTRODUODENOSCOPY  01/20/2012   Procedure: ESOPHAGOGASTRODUODENOSCOPY (EGD);  Surgeon: Meryl Dare, MD,FACG;  Location: [facility] ENDOSCOPY;  Service: Endoscopy;  Laterality: N/A;   ESOPHAGOGASTRODUODENOSCOPY N/A 03/26/2013   Procedure: ESOPHAGOGASTRODUODENOSCOPY (EGD);  Surgeon: Hilarie Fredrickson, MD;  Location: [facility] ENDOSCOPY;  Service: Endoscopy;  Laterality: N/A;   ESOPHAGOGASTRODUODENOSCOPY N/A 04/30/2015   Procedure: ESOPHAGOGASTRODUODENOSCOPY (EGD);  Surgeon: Malissa Hippo, MD;  [facility];  Service: Endoscopy;  Laterality: N/A;  1pm - moved to 10/20 @ 1:10   ESOPHAGOGASTRODUODENOSCOPY N/A 07/29/2016   Procedure: ESOPHAGOGASTRODUODENOSCOPY (EGD);  Surgeon: Ruffin Frederick, MD;  Location: [facility] ENDOSCOPY;  Service: Gastroenterology;  Laterality: N/A;  enteroscopy   ESOPHAGOGASTRODUODENOSCOPY N/A 09/26/2019   Procedure: ESOPHAGOGASTRODUODENOSCOPY (EGD);  Surgeon: Malissa Hippo, MD;   [facility];  Service: Endoscopy;  Laterality: N/A;  1250   ESOPHAGOGASTRODUODENOSCOPY N/A 08/11/2022   Procedure: ESOPHAGOGASTRODUODENOSCOPY (EGD);  Surgeon: Beverley Fiedler, MD;  Location: [facility] ENDOSCOPY;  Service: Gastroenterology;  Laterality: N/A;   ESOPHAGOGASTRODUODENOSCOPY (EGD) WITH PROPOFOL N/A 02/05/2021   Procedure: ESOPHAGOGASTRODUODENOSCOPY (EGD) WITH PROPOFOL;  Surgeon: Lanelle Bal, DO;  [facility];  Service: Endoscopy;  Laterality: N/A;   ESOPHAGOGASTRODUODENOSCOPY (EGD) WITH PROPOFOL N/A 08/27/2022   Procedure: ESOPHAGOGASTRODUODENOSCOPY (EGD) WITH PROPOFOL;  Surgeon: Lanelle Bal, DO;  [facility];  Service: Endoscopy;  Laterality: N/A;   GIVENS CAPSULE STUDY N/A 03/07/2019   Procedure: GIVENS CAPSULE STUDY;  Surgeon: Malissa Hippo, MD;  [facility];  Service: Endoscopy;  Laterality: N/A;  7:30   GIVENS CAPSULE STUDY N/A 04/22/2021   Procedure: GIVENS CAPSULE STUDY;  Surgeon: Malissa Hippo, MD;  [facility];  Service: Endoscopy;  Laterality: N/A;  7:30   GIVENS CAPSULE STUDY N/A 08/27/2022   Procedure: GIVENS CAPSULE STUDY;  Surgeon: Lanelle Bal, DO;  [facility];  Service: Endoscopy;  Laterality: N/A;   HOT HEMOSTASIS N/A 08/11/2022   Procedure: HOT HEMOSTASIS (ARGON PLASMA COAGULATION/BICAP);  Surgeon: Beverley Fiedler, MD;  Location: [facility] ENDOSCOPY;  Service: Gastroenterology;  Laterality: N/A;   HOT HEMOSTASIS N/A 12/01/2022   Procedure: HOT HEMOSTASIS (ARGON PLASMA COAGULATION/BICAP);  Surgeon: Benancio Deeds, MD;  Location: [facility] ENDOSCOPY;  Service: Gastroenterology;  Laterality: N/A;   INSERTION OF DIALYSIS CATHETER N/A 10/05/2020   Procedure: ABORTED TUNNELED DIALYSIS CATHETER PLACEMENT RIGHT INTERNAL JUGULAR VEIN ;  Surgeon: Lucretia Roers, MD;  Location: [facility]S;  Service: General;  Laterality: N/A;   INTRAOPERATIVE TRANSESOPHAGEAL ECHOCARDIOGRAM  06/13/2012   Procedure: INTRAOPERATIVE  TRANSESOPHAGEAL ECHOCARDIOGRAM;  Surgeon: Delight Ovens, MD;  Location: [facility] OR;  Service: Open Heart Surgery;  Laterality: N/A;   IR DIALY SHUNT INTRO NEEDLE/INTRACATH INITIAL W/IMG LEFT Left 10/06/2020   IR FLUORO GUIDE CV LINE RIGHT  06/17/2020   IR FLUORO GUIDE CV LINE RIGHT  11/12/2022   IR  GENERIC HISTORICAL  07/26/2016   IR FLUORO GUIDE CV LINE RIGHT 07/26/2016 Berdine Dance, MD [facility]   IR GENERIC HISTORICAL  07/26/2016   IR US GUIDE VASC ACCESS RIGHT 07/26/2016 Berdine Dance, MD [facility]   IR GENERIC HISTORICAL  08/02/2016   IR US GUIDE VASC ACCESS RIGHT 08/02/2016 Berdine Dance, MD [facility]   IR GENERIC HISTORICAL  08/02/2016   IR FLUORO GUIDE CV LINE RIGHT 08/02/2016 Berdine Dance, MD [facility]   IR RADIOLOGY PERIPHERAL GUIDED IV START  03/28/2017   IR REMOVAL TUN CV CATH W/O FL  08/11/2020   IR REMOVAL TUN CV CATH W/O FL  11/15/2022   IR THROMBECTOMY AV FISTULA W/THROMBOLYSIS INC/SHUNT/IMG LEFT Left 06/17/2020   IR US GUIDE VASC ACCESS LEFT  06/17/2020   IR US GUIDE VASC ACCESS RIGHT  03/28/2017   IR US GUIDE VASC ACCESS RIGHT  06/17/2020   IR US GUIDE VASC ACCESS RIGHT  11/12/2022   LEFT HEART CATH AND CORONARY ANGIOGRAPHY N/A 09/20/2016   Procedure: Left Heart Cath and Coronary Angiography;  Surgeon: Lyn Records, MD;  Location: [facility] INVASIVE CV LAB;  Service: Cardiovascular;  Laterality: N/A;   LEFT HEART CATH AND CORS/GRAFTS ANGIOGRAPHY N/A 10/13/2016   Procedure: Left Heart Cath and Cors/Grafts Angiography;  Surgeon: Lennette Bihari, MD;  [facility]: [facility] INV[facility] CV LAB;  Service: Cardiovascular;  Laterality: N/A;   LEFT HEART CATH AND CORS/GRAFTS ANGIOGRAPHY N/A 07/13/2018   Procedure: LEFT HEART CATH AND CORS/GRAFTS ANGIOGRAPHY;  Surgeon: Swaziland, Peter M, MD;  Location: [facility] INVASIVE CV LAB;  Service: Cardiovascular;  Laterality: N/A;   LEFT HEART CATH AND CORS/GRAFTS ANGIOGRAPHY N/A 07/22/2021   Procedure: LEFT HEART CATH AND CORS/GRAFTS ANGIOGRAPHY;  Surgeon: Runell Gess, MD;   [facility]: [facility] INVASIVE CV LAB;  Service: Cardiovascular;  Laterality: N/A;   LEFT HEART CATHETERIZATION WITH CORONARY ANGIOGRAM N/A 12/15/2011   Procedure: LEFT HEART CATHETERIZATION WITH CORONARY ANGIOGRAM;  Surgeon: Kathleene Hazel, MD;  Location: [facility] CATH LAB;  Service: Cardiovascular;  Laterality: N/A;   LEFT HEART CATHETERIZATION WITH CORONARY ANGIOGRAM N/A 01/10/2012   Procedure: LEFT HEART CATHETERIZATION WITH CORONARY ANGIOGRAM;  Surgeon: Peter M Swaziland, MD;  Location: [facility] CATH LAB;  Service: Cardiovascular;  Laterality: N/A;   LEFT HEART CATHETERIZATION WITH CORONARY ANGIOGRAM N/A 06/08/2012   Procedure: LEFT HEART CATHETERIZATION WITH CORONARY ANGIOGRAM;  Surgeon: Kathleene Hazel, MD;  Location: [facility] CATH LAB;  Service: Cardiovascular;  Laterality: N/A;   LEFT HEART CATHETERIZATION WITH CORONARY/GRAFT ANGIOGRAM N/A 12/10/2013   Procedure: LEFT HEART CATHETERIZATION WITH Isabel Caprice;  Surgeon: Corky Crafts, MD;  Location: [facility] CATH LAB;  Service: Cardiovascular;  Laterality: N/A;   OVARY SURGERY     ovarian cancer   POLYPECTOMY  03/09/2019   Procedure: POLYPECTOMY;  Surgeon: Malissa Hippo, MD;  [facility];  Service: Endoscopy;;  cecal    POLYPECTOMY N/A 09/26/2019   Procedure: DUODENAL POLYPECTOMY;  Surgeon: Malissa Hippo, MD;  [facility];  Service: Endoscopy;  Laterality: N/A;   POLYPECTOMY  08/11/2022   Procedure: POLYPECTOMY;  Surgeon: Beverley Fiedler, MD;  Location: [facility] ENDOSCOPY;  Service: Gastroenterology;;   REVISION OF ARTERIOVENOUS GORETEX GRAFT N/A 02/24/2017   Procedure: REVISION OF ARTERIOVENOUS GORETEX GRAFT (RESECTION);  Surgeon: Renford Dills, MD;  Location: [facility] ORS;  Service: Vascular;  Laterality: N/A;   REVISON OF ARTERIOVENOUS FISTULA Left 06/19/2020   Procedure: REVISION OF LEFT UPPER ARM AV GRAFT WITH INTERPOSITION JUMP GRAFT USING  GORE LIMB;  Surgeon: Cephus Shelling, MD;  Location: [facility] OR;  Service:  Vascular;  Laterality: Left;   SHUNTOGRAM N/A 10/15/2013   Procedure: Fistulogram;  Surgeon: Nada Libman, MD;  Location: [facility] CATH LAB;  Service: Cardiovascular;  Laterality: N/A;   SUBMUCOSAL TATTOO INJECTION  11/01/2022   Procedure: SUBMUCOSAL TATTOO INJECTION;  Surgeon: Dolores Frame, MD;  [facility];  Service: Gastroenterology;;   THROMBECTOMY / ARTERIOVENOUS GRAFT REVISION  2011   left upper arm   TUBAL LIGATION  1980's   UPPER EXTREMITY ANGIOGRAPHY Bilateral 12/06/2016   Procedure: Upper Extremity Angiography;  Surgeon: Renford Dills, MD;  Location: [facility] INVASIVE CV LAB;  Service: Cardiovascular;  Laterality: Bilateral;   UPPER EXTREMITY INTERVENTION Left 06/06/2017   Procedure: UPPER EXTREMITY INTERVENTION;  Surgeon: Renford Dills, MD;  Location: [facility] INVASIVE CV LAB;  Service: Cardiovascular;  Laterality: Left;   UPPER EXTREMITY VENOGRAPHY Left 10/04/2022   Procedure: UPPER EXTREMITY VENOGRAPHY;  Surgeon: Renford Dills, MD;  Location: [facility] INVASIVE CV LAB;  Service: Cardiovascular;  Laterality: Left;      Social History:     Social History   Tobacco Use   Smoking status: Never   Smokeless tobacco: Never  Substance Use Topics   Alcohol use: No    Alcohol/week: 0.0 standard drinks of alcohol        Family History :     Family History  Problem Relation Age of Onset   Heart disease Mother        Heart Disease before age 70   Hyperlipidemia Mother    Hypertension Mother    Diabetes Mother    Heart attack Mother    Heart disease Father        Heart Disease before age 78   Hyperlipidemia Father    Hypertension Father    Diabetes Father    Diabetes Sister    Hypertension Sister    Diabetes Brother    Hyperlipidemia Brother    Heart attack Brother    Hypertension Sister    Heart attack Brother    Colon cancer Child 58   Other Other        noncontributory for early CAD   Esophageal cancer Neg Hx    Liver disease Neg Hx     Kidney disease Neg Hx    Colon polyps Neg Hx     Home Medications:   Prior to Admission medications   Medication Sig Start Date End Date Taking? Authorizing Provider  amiodarone (PACERONE) 200 MG tablet Take 100 mg by mouth daily. 08/27/23  Yes [provider]  benzonatate (TESSALON) 100 MG capsule Take 100 mg by mouth 3 (three) times daily as needed for cough. 08/28/23  Yes [provider]  calcitRIOL (ROCALTROL) 0.25 MCG capsule Take 7 capsules (1.75 mcg total) by mouth every Monday, Wednesday, and Friday with hemodialysis. 12/21/22  Yes Lanae Boast, MD  cinacalcet (SENSIPAR) 30 MG tablet Take 1 tablet (30 mg total) by mouth every Monday, Wednesday, and Friday with hemodialysis. 12/19/22  Yes Lanae Boast, MD  cyanocobalamin (VITAMIN B12) 1000 MCG/ML injection Inject 1,000 mcg into the muscle every 30 (thirty) days.   Yes [provider]  cyclobenzaprine (FLEXERIL) 5 MG tablet Take 5 mg by mouth 3 (three) times daily as needed for muscle spasms.   Yes [provider]  famotidine (PEPCID) 20 MG tablet Take 1 tablet (20 mg total) by mouth at bedtime. Patient taking differently: Take 20 mg by  mouth daily as needed for heartburn or indigestion. 08/24/23 08/23/24 Yes Vassie Loll, MD  folic acid (FOLVITE) 1 MG tablet Take 1 mg by mouth daily. 05/24/22  Yes [provider]  hydrALAZINE (APRESOLINE) 25 MG tablet Take 1 tablet (25 mg total) by mouth 2 (two) times daily. Hold for sbp <110 12/27/22  Yes Kc, Dayna Barker, MD  hydrOXYzine (ATARAX) 25 MG tablet Take 25 mg by mouth every 6 (six) hours as needed for itching. 06/06/22  Yes [provider]  levETIRAcetam (KEPPRA) 500 MG tablet Take 1 tablet (500 mg total) by mouth daily. 11/15/22  Yes Leroy Sea, MD  levothyroxine (SYNTHROID) 50 MCG tablet Take 50 mcg by mouth daily. 08/27/23  Yes [provider]  metoprolol tartrate (LOPRESSOR) 25 MG tablet Take 1 tablet (25 mg total) by mouth 2 (two) times  daily. 08/24/23  Yes Vassie Loll, MD  multivitamin (RENA-VIT) TABS tablet Take 1 tablet by mouth daily.   Yes [provider]  nitroGLYCERIN (NITROSTAT) 0.4 MG SL tablet Place 0.4 mg under the tongue every 5 (five) minutes as needed for chest pain.   Yes [provider]  octreotide (SANDOSTATIN LAR) 20 MG injection Inject 20 mg into the muscle every 28 (twenty-eight) days. 11/15/22  Yes Leroy Sea, MD  ondansetron (ZOFRAN-ODT) 4 MG disintegrating tablet Take 4 mg by mouth every 8 (eight) hours as needed for nausea or vomiting. 05/22/23  Yes [provider]  rosuvastatin (CRESTOR) 10 MG tablet Take 1 tablet by mouth once daily 06/06/22  Yes Strader, Lennart Pall, PA-C     Allergies:     Allergies  Allergen Reactions   Amlodipine Swelling   Aspirin Other (See Comments)    High Doses Mess up her stomach; "makes my bowels have blood in them". Takes 81 mg EC Aspirin    Nitrofurantoin Hives   Ranexa [Ranolazine] Other (See Comments)    Myoclonus-hospitalized    Bactrim [Sulfamethoxazole-Trimethoprim] Rash   Iodinated Contrast Media Itching and Other (See Comments)   Iron Itching and Other (See Comments)    "they gave me iron in dialysis; had to give me Benadryl cause I had to have the iron" (05/02/2012)   Tylenol [Acetaminophen] Itching and Other (See Comments)    Makes her feet on fire per pt - can tolerate per pt   Gabapentin Other (See Comments)    Unknown reaction   Sucroferric Oxyhydroxide Other (See Comments)    Unknown   Levaquin [Levofloxacin In D5w] Rash   Levofloxacin Rash and Other (See Comments)   Plavix [Clopidogrel Bisulfate] Rash   Protonix [Pantoprazole Sodium] Rash   Venofer [Ferric Oxide] Itching and Other (See Comments)    Patient reports using Benadryl prior to doses as [facility]     Physical Exam:   Vitals  Blood pressure (!) 151/81, pulse (!) 55, temperature 98.6 F (37 C), temperature source Oral, resp. rate (!) 23,  height 5\' 1"  (1.549 m), weight 50.1 kg, SpO2 95%.   1. General Health, chronic ill-appearing female, laying in bed, sleeping comfortably in no apparent distress  2.  He is somnolent, but wakes up, answering simple question, smile and go back to sleep  3. No F.N deficits, ALL C.Nerves Intact, Strength 5/5 all 4 extremities, Sensation intact all 4 extremities, Plantars down going.  4. Ears and Eyes appear Normal, Conjunctivae clear, PERRLA. Moist Oral Mucosa.  5. Supple Neck, No JVD, No cervical lymphadenopathy appriciated, No Carotid Bruits.  6. Symmetrical Chest wall movement,  mildly tachypneic with crackles at bases  7.  Bradycardic, No Gallops, Rubs or Murmurs, No Parasternal Heave.  +1 edema  8. Positive Bowel Sounds, Abdomen Soft, No tenderness, No organomegaly appriciated,No rebound -guarding or rigidity.  9.  No Cyanosis, Normal Skin Turgor, No Skin Rash or Bruise.  10. Good muscle tone,  joints appear normal , no effusions, Normal ROM.    Data Review:    CBC Recent Labs  Lab 09/08/23 1401  WBC 5.1  HGB 8.3*  HCT 27.1*  PLT 206  MCV 108.8*  MCH 33.3  MCHC 30.6  RDW 25.3*  LYMPHSABS 0.6*  MONOABS 0.4  EOSABS 0.1  BASOSABS 0.3*   ------------------------------------------------------------------------------------------------------------------  Chemistries  Recent Labs  Lab 09/08/23 1401  NA 141  K 3.2*  CL 103  CO2 26  GLUCOSE 145*  BUN 26*  CREATININE 4.60*  CALCIUM 8.1*   ------------------------------------------------------------------------------------------------------------------ estimated creatinine clearance is 7 mL/min (A) (by C-G formula based on SCr of 4.6 mg/dL (H)). ------------------------------------------------------------------------------------------------------------------ No results for input(s): "TSH", "T4TOTAL", "T3FREE", "THYROIDAB" in the last 72 hours.  Invalid input(s): "FREET3"  Coagulation profile No results for  input(s): "INR", "PROTIME" in the last 168 hours. ------------------------------------------------------------------------------------------------------------------- No results for input(s): "DDIMER" in the last 72 hours. -------------------------------------------------------------------------------------------------------------------  Cardiac Enzymes No results for input(s): "CKMB", "TROPONINI", "MYOGLOBIN" in the last 168 hours.  Invalid input(s): "CK" ------------------------------------------------------------------------------------------------------------------    Component Value Date/Time   BNP 3,351.0 (H) 05/13/2023 1500     ---------------------------------------------------------------------------------------------------------------  Urinalysis    Component Value Date/Time   COLORURINE YELLOW 12/16/2012 1919   APPEARANCEUR CLOUDY (A) 12/16/2012 1919   LABSPEC 1.009 12/16/2012 1919   PHURINE 7.5 12/16/2012 1919   GLUCOSEU NEGATIVE 12/16/2012 1919   HGBUR TRACE (A) 12/16/2012 1919   BILIRUBINUR NEGATIVE 12/16/2012 1919   KETONESUR NEGATIVE 12/16/2012 1919   PROTEINUR 100 (A) 12/16/2012 1919   UROBILINOGEN 0.2 12/16/2012 1919   NITRITE NEGATIVE 12/16/2012 1919   LEUKOCYTESUR SMALL (A) 12/16/2012 1919    ----------------------------------------------------------------------------------------------------------------   Imaging Results:    CT Head Wo Contrast Result Date: 09/08/2023 CLINICAL DATA:  Altered mental status. EXAM: CT HEAD WITHOUT CONTRAST TECHNIQUE: Contiguous axial images were obtained from the base of the skull through the vertex without intravenous contrast. RADIATION DOSE REDUCTION: This exam was performed according to the departmental dose-optimization program which includes automated exposure control, adjustment of the mA and/or kV according to patient size and/or use of iterative reconstruction technique. COMPARISON:  Head CT dated 04/10/2023. FINDINGS:  Evaluation of this exam is limited due to motion artifact. Brain: Moderate age-related atrophy and chronic microvascular ischemic changes. There is no acute intracranial hemorrhage. No mass effect or midline shift. No extra-axial fluid collection. Vascular: No hyperdense vessel or unexpected calcification. Skull: Normal. Negative for fracture or focal lesion. Sinuses/Orbits: There is mucoperiosteal thickening of paranasal sinuses. No air-fluid level. The mastoid air cells are clear. Other: None IMPRESSION: 1. No acute intracranial pathology. 2. Moderate age-related atrophy and chronic microvascular ischemic changes. Electronically Signed   By: Elgie Collard M.D.   On: 09/08/2023 18:42   DG Chest Port 1 View Result Date: 09/08/2023 CLINICAL DATA:  Weakness entire.  Bradycardia EXAM: PORTABLE CHEST 1 VIEW COMPARISON:  X-ray 08/19/2023 FINDINGS: Enlarged cardiopericardial silhouette with sternal wires. Double-lumen left IJ catheter with tip overlying the right atrium. Small bilateral pleural effusions with adjacent opacities. The opacities are increased from previous. No pneumothorax. Vascular gesture with some interstitial changes, possibly edema. Fluid tracks along the minor fissure. Overlapping cardiac leads.  Vascular stent along the left upper extremity at the edge of the imaging field. Patient is tilted IMPRESSION: Enlarged heart with vascular congestion and trace edema. Small bilateral pleural effusions. Lung base opacities are seen, increased on the right. Left IJ double-lumen catheter with tip overlying the right atrium. Electronically Signed   By: Karen Kays M.D.   On: 09/08/2023 16:14    EKG:  Vent. rate 55 BPM PR interval 159 ms QRS duration 124 ms QT/QTcB 518/496 ms P-R-T axes -45 37 219 Sinus or ectopic atrial rhythm Left bundle branch block  Assessment & Plan:    Principal Problem:   Bradycardia Active Problems:   Acute metabolic encephalopathy   Diabetes mellitus type 2 in  nonobese [facility])   Acquired hypothyroidism   ESRD on hemodialysis (HCC)   CAD (coronary artery disease)   Atrial fibrillation, chronic (HCC)    Bradycardia -Heart rate 37 on EKG, currently in the 50s, this most likely in setting he still taking amiodarone and metoprolol -Have discussed with her husband, I have informed him she was supposed to stop taking amiodarone, but apparently she was still taking it by mistake, as well there were holding parameters for her metoprolol  as well. -Will monitor on stepdown, for now we will hold on interventions besides holding these meds, but if needed can needed with dopamine drip, glucagon drip or atropine if vitals become unstable.  Weakness Acute metabolic encephalopathy -Patient with significant deconditioning at baseline, but she is significantly weak over last few days, and altered, this is most likely worsened by her bradycardia. -CT head with no acute findings -She is somnolent, but wakes up, smiles, so for now I will keep her n.p.o. till she is more awake  Acute on chronic combined systolic and diastolic CHF -Echocardiogram from 08/22/23 with EF of 45 to 50% with grade 2 diastolic dysfunction  -Volume management with HD, she missed HD today, apparently she has been having her recent hemodialysis and fluid removal due to soft blood pressure, some evidence of volume overload on imaging, mildly tachypneic with lower extremity edema.  ESRD -Please see above discussion, renal consulted, for HD in a.m.  Hypertension -Currently blood pressures acceptable, will hold all meds for now and keep as needed hydralazine  Paroxysmal A-fib -Not anticoagulation candidate due to history of GI bleed in the past and symptomatic anemia requiring multiple transfusions. -Please see above discussion regarding holding both metoprolol and amiodarone,   Mixed hyperlipidemia -Continue with Crestor  Hypothyroidism -continue with Synthroid  Goals of care  discussion -I have started discussion with husband, and daughter at bedside, explaining to discuss goals status in the future, given patient's extreme frailty, multiple comorbidities, and gradual decline in her health, mental and physical capacity as well, I have encouraged husband to reconsider CODE STATUS, have discussed this few times in the past, she remains full code   DVT Prophylaxis  SCDs  AM Labs Ordered, also please review Full Orders  Family Communication: Admission, patients condition and plan of care including tests being ordered have been discussed with the patient and husband and daughter at bedside who indicate understanding and agree with the plan and Code Status.  Code Status full code  Likely DC to home today  Consults called:   Renal,  Admission status: Inpatient  Time spent in minutes : 70 minutes   Huey Bienenstock M.D on 09/08/2023 at 8:04 PM   [facility] - Office  [phone]

## 2023-09-08 NOTE — ED Triage Notes (Signed)
 Pt's husband stated that pt was unable to have dialysis done today due to heart rate. Pt's heart rate is 37 in triage and husband stated that pt has been very weak and tired lately.

## 2023-09-08 NOTE — ED Provider Notes (Signed)
 [place] EMERGENCY DEPARTMENT AT [facility] Provider Note   CSN: [id number] Arrival date & time: 09/08/23  1233     History  Chief Complaint  Patient presents with   Weakness    Shawna [name] is a 83 y.o. female.  Patient has a history of paroxysmal A-fib, end-stage renal disease on dialysis Monday Wednesday Friday.  She was recently admitted to the hospital for pneumonia.  She was brought in today by family after being unable to have dialysis today due to slow heart rate.  She is very poor historian.  Endorses some general weakness.  No pain.  Husband states she has not been able to get a full course of dialysis since she was discharged few weeks ago.  The history is provided by the patient and the spouse.  Weakness Severity:  Unable to specify Onset quality:  Gradual Timing:  Constant Progression:  Unchanged Associated symptoms: shortness of breath   Associated symptoms: no chest pain, no fever and no vomiting        Home Medications Prior to Admission medications   Medication Sig Start Date End Date Taking? Authorizing Provider  amiodarone (PACERONE) 200 MG tablet Take 100 mg by mouth daily. 08/27/23  Yes [provider]  benzonatate (TESSALON) 100 MG capsule Take 100 mg by mouth 3 (three) times daily as needed. 08/28/23  Yes [provider]  levothyroxine (SYNTHROID) 50 MCG tablet Take 50 mcg by mouth daily. 08/27/23  Yes [provider]  calcitRIOL (ROCALTROL) 0.25 MCG capsule Take 7 capsules (1.75 mcg total) by mouth every Monday, Wednesday, and Friday with hemodialysis. 12/21/22   Lanae Boast, MD  cinacalcet (SENSIPAR) 30 MG tablet Take 1 tablet (30 mg total) by mouth every Monday, Wednesday, and Friday with hemodialysis. 12/19/22   Lanae Boast, MD  cyanocobalamin (VITAMIN B12) 1000 MCG/ML injection Inject 1,000 mcg into the muscle every 30 (thirty) days.    [provider]  cyclobenzaprine (FLEXERIL) 5 MG tablet Take 5 mg by mouth  3 (three) times daily as needed for muscle spasms.    [provider]  famotidine (PEPCID) 20 MG tablet Take 1 tablet (20 mg total) by mouth at bedtime. 08/24/23 08/23/24  Vassie Loll, MD  folic acid (FOLVITE) 1 MG tablet Take 1 mg by mouth daily. 05/24/22   [provider]  hydrALAZINE (APRESOLINE) 25 MG tablet Take 1 tablet (25 mg total) by mouth 2 (two) times daily. Hold for sbp <110 12/27/22   Lanae Boast, MD  hydrOXYzine (ATARAX) 25 MG tablet Take 25 mg by mouth every 6 (six) hours as needed for itching. 06/06/22   [provider]  levETIRAcetam (KEPPRA) 500 MG tablet Take 1 tablet (500 mg total) by mouth daily. 11/15/22   Leroy Sea, MD  levothyroxine (SYNTHROID) 125 MCG tablet Take 1 tablet (125 mcg total) by mouth daily at 6 (six) AM. 08/09/23 09/08/23  Hughie Closs, MD  metoprolol tartrate (LOPRESSOR) 25 MG tablet Take 1 tablet (25 mg total) by mouth 2 (two) times daily. 08/24/23   Vassie Loll, MD  multivitamin (RENA-VIT) TABS tablet Take 1 tablet by mouth daily.    [provider]  nitroGLYCERIN (NITROSTAT) 0.4 MG SL tablet Place 0.4 mg under the tongue every 5 (five) minutes as needed for chest pain.    [provider]  octreotide (SANDOSTATIN LAR) 20 MG injection Inject 20 mg into the muscle every 28 (twenty-eight) days. 11/15/22   Leroy Sea, MD  ondansetron (ZOFRAN-ODT) 4  MG disintegrating tablet Take 4 mg by mouth every 8 (eight) hours as needed for nausea or vomiting. 05/22/23   [provider]  rosuvastatin (CRESTOR) 10 MG tablet Take 1 tablet by mouth once daily 06/06/22   Iran Ouch, [place] M, PA-C  saccharomyces boulardii (FLORASTOR) 250 MG capsule Take 1 capsule (250 mg total) by mouth 2 (two) times daily. 08/24/23   Vassie Loll, MD  UNABLE TO FIND Nugo Milk Chocolate Consume on non-dialysis days only storage for maximum shelf life, store bars below 75 degrees F. Use immediately upon opening. 08/15/23   [provider]      Allergies    Amlodipine, Aspirin, Nitrofurantoin, Ranexa [ranolazine], Bactrim [sulfamethoxazole-trimethoprim], Iodinated contrast media, Iron, Tylenol [acetaminophen], Gabapentin, Sucroferric oxyhydroxide, Levaquin [levofloxacin in d5w], Levofloxacin, Plavix [clopidogrel bisulfate], Protonix [pantoprazole sodium], and Venofer [ferric oxide]    Review of Systems   Review of Systems  Constitutional:  Negative for fever.  Respiratory:  Positive for shortness of breath.   Cardiovascular:  Negative for chest pain.  Gastrointestinal:  Negative for vomiting.  Neurological:  Positive for weakness.    Physical Exam Updated Vital Signs BP (!) 161/69   Pulse (!) 55   Temp 98.6 F (37 C) (Oral)   Resp (!) 22   Ht 5\' 1"  (1.549 m)   Wt 50.1 kg   SpO2 94%   BMI 20.87 kg/m  Physical Exam Vitals and nursing note reviewed.  Constitutional:      General: She is not in acute distress.    Appearance: Normal appearance. She is well-developed.  HENT:     Head: Normocephalic and atraumatic.  Eyes:     Conjunctiva/sclera: Conjunctivae normal.  Cardiovascular:     Rate and Rhythm: Normal rate and regular rhythm.     Heart sounds: No murmur heard. Pulmonary:     Effort: Accessory muscle usage present. No respiratory distress.     Breath sounds: Normal breath sounds.  Abdominal:     Palpations: Abdomen is soft.     Tenderness: There is no abdominal tenderness. There is no guarding or rebound.  Musculoskeletal:        General: No swelling.     Cervical back: Neck supple.     Comments: Fistula left upper arm with positive thrill  Skin:    General: Skin is warm and dry.     Capillary Refill: Capillary refill takes less than 2 seconds.  Neurological:     General: No focal deficit present.     Mental Status: She is alert.     Comments: She is awake.  No gross facial asymmetry.  Normal speech.  No focal deficit.     ED Results / Procedures / Treatments   Labs (all labs  ordered are listed, but only abnormal results are displayed) Labs Reviewed  CBC WITH DIFFERENTIAL/PLATELET - Abnormal; Notable for the following components:      Result Value   RBC 2.49 (*)    Hemoglobin 8.3 (*)    HCT 27.1 (*)    MCV 108.8 (*)    RDW 25.3 (*)    Lymphs Abs 0.6 (*)    Basophils Absolute 0.3 (*)    All other components within normal limits  BASIC METABOLIC PANEL - Abnormal; Notable for the following components:   Potassium 3.2 (*)    Glucose, Bld 145 (*)    BUN 26 (*)    Creatinine, Ser 4.60 (*)    Calcium 8.1 (*)    GFR, Estimated 9 (*)  All other components within normal limits  BLOOD GAS, VENOUS - Abnormal; Notable for the following components:   pO2, Ven <31 (*)    Bicarbonate 30.6 (*)    Acid-Base Excess 4.0 (*)    All other components within normal limits  CBC - Abnormal; Notable for the following components:   RBC 2.69 (*)    Hemoglobin 9.2 (*)    HCT 30.1 (*)    MCV 111.9 (*)    MCH 34.2 (*)    RDW 25.2 (*)    All other components within normal limits  RENAL FUNCTION PANEL - Abnormal; Notable for the following components:   Potassium 3.2 (*)    Glucose, Bld 135 (*)    BUN 26 (*)    Creatinine, Ser 4.59 (*)    Calcium 8.4 (*)    Phosphorus 2.3 (*)    Albumin 2.9 (*)    GFR, Estimated 9 (*)    All other components within normal limits  HEPATITIS B SURFACE ANTIGEN  HEPATITIS B SURFACE ANTIBODY, QUANTITATIVE    EKG EKG Interpretation Date/Time:  Friday September 08 2023 14:17:29 EST Ventricular Rate:  55 PR Interval:  159 QRS Duration:  124 QT Interval:  518 QTC Calculation: 496 R Axis:   37  Text Interpretation: Sinus or ectopic atrial rhythm Left bundle branch block No significant change since prior 12/24 Confirmed by Meridee Score [phone]) on 09/08/2023 3:03:19 PM  Radiology CT Head Wo Contrast Result Date: 09/08/2023 CLINICAL DATA:  Altered mental status. EXAM: CT HEAD WITHOUT CONTRAST TECHNIQUE: Contiguous axial images were  obtained from the base of the skull through the vertex without intravenous contrast. RADIATION DOSE REDUCTION: This exam was performed according to the departmental dose-optimization program which includes automated exposure control, adjustment of the mA and/or kV according to patient size and/or use of iterative reconstruction technique. COMPARISON:  Head CT dated 04/10/2023. FINDINGS: Evaluation of this exam is limited due to motion artifact. Brain: Moderate age-related atrophy and chronic microvascular ischemic changes. There is no acute intracranial hemorrhage. No mass effect or midline shift. No extra-axial fluid collection. Vascular: No hyperdense vessel or unexpected calcification. Skull: Normal. Negative for fracture or focal lesion. Sinuses/Orbits: There is mucoperiosteal thickening of paranasal sinuses. No air-fluid level. The mastoid air cells are clear. Other: None IMPRESSION: 1. No acute intracranial pathology. 2. Moderate age-related atrophy and chronic microvascular ischemic changes. Electronically Signed   By: Elgie Collard M.D.   On: 09/08/2023 18:42   DG Chest Port 1 View Result Date: 09/08/2023 CLINICAL DATA:  Weakness entire.  Bradycardia EXAM: PORTABLE CHEST 1 VIEW COMPARISON:  X-ray 08/19/2023 FINDINGS: Enlarged cardiopericardial silhouette with sternal wires. Double-lumen left IJ catheter with tip overlying the right atrium. Small bilateral pleural effusions with adjacent opacities. The opacities are increased from previous. No pneumothorax. Vascular gesture with some interstitial changes, possibly edema. Fluid tracks along the minor fissure. Overlapping cardiac leads. Vascular stent along the left upper extremity at the edge of the imaging field. Patient is tilted IMPRESSION: Enlarged heart with vascular congestion and trace edema. Small bilateral pleural effusions. Lung base opacities are seen, increased on the right. Left IJ double-lumen catheter with tip overlying the right atrium.  Electronically Signed   By: Karen Kays M.D.   On: 09/08/2023 16:14    Procedures Procedures    Medications Ordered in ED Medications  nitroGLYCERIN (NITROSTAT) SL tablet 0.4 mg (has no administration in time range)  rosuvastatin (CRESTOR) tablet 10 mg (has no administration in time range)  calcitRIOL (ROCALTROL) capsule 1.75 mcg (has no administration in time range)  cinacalcet (SENSIPAR) tablet 30 mg (has no administration in time range)  levothyroxine (SYNTHROID) tablet 50 mcg (50 mcg Oral Given 09/09/23 0614)  ondansetron (ZOFRAN-ODT) disintegrating tablet 4 mg (has no administration in time range)  levETIRAcetam (KEPPRA) tablet 500 mg (has no administration in time range)  multivitamin (RENA-VIT) tablet 1 tablet (has no administration in time range)  albuterol (PROVENTIL) (2.5 MG/3ML) 0.083% nebulizer solution 2.5 mg (has no administration in time range)  Chlorhexidine Gluconate Cloth 2 % PADS 6 each (6 each Topical Given 09/09/23 0656)  Chlorhexidine Gluconate Cloth 2 % PADS 6 each (has no administration in time range)  hydrALAZINE (APRESOLINE) injection 10 mg (10 mg Intravenous Given 09/09/23 0439)    ED Course/ Medical Decision Making/ A&P Clinical Course as of 09/09/23 1040  Fri Sep 08, 2023  1537 Chest x-ray interpreted by me as cardiomegaly interstitial edema possible right base infiltrate.  Awaiting radiology reading [MB]  1649 Patient is not requiring any supplemental oxygen heart rate has been fairly stable here.  Blood pressure is also good.  I reviewed this with Dr. Malen Gauze on-call for nephrology. [MB]  1818 Patient's daughter is here now and able to give a little bit more clear history.  She has been more confused over the last week.  Can getting weaker and having more difficulty breathing.  Did not go to dialysis today because she was so weak. [MB]    Clinical Course User Index [MB] Terrilee Files, MD                                 Medical Decision Making Amount  and/or Complexity of Data Reviewed Labs: ordered. Radiology: ordered.  Risk Decision regarding hospitalization.   This patient complains of low heart rate general weakness confusion; this involves an extensive number of treatment Options and is a complaint that carries with it a high risk of complications and morbidity. The differential includes arrhythmia, overmedication, fluid overload, metabolic derangement, stroke  I ordered, reviewed and interpreted labs, which included CBC with low hemoglobin stable from prior, chemistries consistent with CKD, VBG without significant acidosis I ordered imaging studies which included chest x-ray and head CT and I independently    visualized and interpreted imaging which showed interstitial edema, no acute stroke Additional history obtained from patient's husband and daughter Previous records obtained and reviewed in epic including recent discharge summary I consulted [facility] Dr. Randol Kern I and discussed lab and imaging findings and discussed disposition.  Cardiac monitoring reviewed, sinus bradycardia Social determinants considered, no significant barriers Critical Interventions: None  After the interventions stated above, I reevaluated the patient and found patient's heart rate to be slightly improved although mental status depressed Admission and further testing considered, she would benefit from mission to the hospital.  Likely need some washout from her medications and would also benefit from dialysis.  I did message Dr. Malen Gauze nephrology that she will be getting admitted and consideration for dialysis tomorrow.  Family in agreement with plan for admission.         Final Clinical Impression(s) / ED Diagnoses Final diagnoses:  Symptomatic bradycardia  Hypervolemia, unspecified hypervolemia type  Generalized weakness  Acute encephalopathy    Rx / DC Orders ED Discharge Orders     None         Terrilee Files,  MD 09/09/23 1044

## 2023-09-09 DIAGNOSIS — R001 Bradycardia, unspecified: Secondary | ICD-10-CM | POA: Diagnosis not present

## 2023-09-09 DIAGNOSIS — G9341 Metabolic encephalopathy: Secondary | ICD-10-CM

## 2023-09-09 DIAGNOSIS — N186 End stage renal disease: Secondary | ICD-10-CM

## 2023-09-09 DIAGNOSIS — Z992 Dependence on renal dialysis: Secondary | ICD-10-CM

## 2023-09-09 LAB — CBC
HCT: 30.1 % — ABNORMAL LOW (ref 36.0–46.0)
Hemoglobin: 9.2 g/dL — ABNORMAL LOW (ref 12.0–15.0)
MCH: 34.2 pg — ABNORMAL HIGH (ref 26.0–34.0)
MCHC: 30.6 g/dL (ref 30.0–36.0)
MCV: 111.9 fL — ABNORMAL HIGH (ref 80.0–100.0)
Platelets: 178 10*3/uL (ref 150–400)
RBC: 2.69 MIL/uL — ABNORMAL LOW (ref 3.87–5.11)
RDW: 25.2 % — ABNORMAL HIGH (ref 11.5–15.5)
WBC: 5.8 10*3/uL (ref 4.0–10.5)
nRBC: 0 % (ref 0.0–0.2)

## 2023-09-09 LAB — HEPATITIS B SURFACE ANTIGEN: Hepatitis B Surface Ag: NONREACTIVE

## 2023-09-09 MED ORDER — ANTICOAGULANT SODIUM CITRATE 4% (200MG/5ML) IV SOLN: 5.0000 mL | Status: DC | PRN

## 2023-09-09 MED ORDER — HYDRALAZINE HCL 20 MG/ML IJ SOLN
10.0000 mg | INTRAMUSCULAR | Status: DC | PRN
Start: 2023-09-09 — End: 2023-09-10
  Administered 2023-09-09: 10 mg via INTRAVENOUS
  Filled 2023-09-09: qty 1

## 2023-09-09 MED ORDER — HEPARIN SODIUM (PORCINE) 1000 UNIT/ML DIALYSIS: 1000.0000 [IU] | INTRAMUSCULAR | Status: DC | PRN

## 2023-09-09 MED ORDER — ALTEPLASE 2 MG IJ SOLR: 2.0000 mg | Freq: Once | INTRAMUSCULAR | Status: DC | PRN

## 2023-09-09 NOTE — Plan of Care (Signed)
  Problem: Education: Goal: Knowledge of General Education information will improve Description: Including pain rating scale, medication(s)/side effects and non-pharmacologic comfort measures Outcome: Progressing   Problem: Health Behavior/Discharge Planning: Goal: Ability to manage health-related needs will improve Outcome: Progressing   Problem: Clinical Measurements: Goal: Ability to maintain clinical measurements within normal limits will improve Outcome: Progressing Goal: Will remain free from infection Outcome: Progressing Goal: Diagnostic test results will improve Outcome: Progressing Goal: Respiratory complications will improve Outcome: Progressing Goal: Cardiovascular complication will be avoided Outcome: Progressing   Problem: Activity: Goal: Risk for activity intolerance will decrease Outcome: Progressing   Problem: Nutrition: Goal: Adequate nutrition will be maintained Outcome: Progressing   Problem: Coping: Goal: Level of anxiety will decrease Outcome: Progressing   Problem: Elimination: Goal: Will not experience complications related to bowel motility Outcome: Progressing   Problem: Pain Managment: Goal: General experience of comfort will improve and/or be controlled Outcome: Progressing   Problem: Safety: Goal: Ability to remain free from injury will improve Outcome: Progressing   Problem: Skin Integrity: Goal: Risk for impaired skin integrity will decrease Outcome: Progressing

## 2023-09-09 NOTE — Progress Notes (Signed)
 PROGRESS NOTE   Shawna [name]  ZOX:[id number] DOB: 05/31/1941 DOA: 09/08/2023 PCP: [facility]   Chief Complaint  Patient presents with   Weakness   Level of care: Stepdown  Brief Admission History:  83 y.o. female,  with medical history significant of ESRD on hemodialysis (MWF), CAD status post CABG, HFrEF, T2DM, hypertension, GERD, atrial fibrillation (not on anticoagulants), patient is well know to our service, given multiple admissions in the past, he is extremely frail, deconditioned, with poor baseline requiring multiple admissions in the past for different reasons.  -Jent was brought to ED by her husband for weakness, her husband apparently patient over last few hemodialysis sessions were not getting any fluid removals due to her soft blood pressure, as well she has not been able to finish a full session of treatment, she has been very weak, somnolent today where she could not go to dialysis so he brought her to ED, she is herself somnolent currently, very poor historian, but she does report generalized weakness, she denies any pain, fever, chills. -In ED her workup significant for bradycardia, she was noted to be sinus bradycardia on EKG at 37 bpm, apparently has been still giving her amiodarone even so it was discontinued on the past, and she is still on metoprolol scheduled while supposed to be with holding parameters as well, CT head with no acute findings, labs significant for potassium 3.2, BUN of 26, hemoglobin at 8.3, x-ray with some evidence of volume overload, given her somnolent, bradycardia and volume overload [facility] hospitalist consulted to admit.   Assessment and Plan:  Bradycardia -Heart rate 37 on EKG, currently in the 50s, this most likely in setting he still taking amiodarone and metoprolol -Have discussed with her husband, I have informed him she was supposed to stop taking amiodarone, but apparently she was still taking it by mistake, as well there were  holding parameters for her metoprolol  as well. -HRs have rebounded and remained normal in stepdown ICU -if remains stable, could possibly discharge after HD otherwise in AM    Weakness Acute metabolic encephalopathy -Patient with significant deconditioning at baseline, but she is significantly weak over last few days, and altered, this is most likely worsened by her bradycardia. -CT head with no acute findings -She seemed to be back to her baseline today.    Acute on chronic combined systolic and diastolic CHF -Echocardiogram from 08/22/23 with EF of 45 to 50% with grade 2 diastolic dysfunction  -Volume management with HD, she missed HD today, apparently she has been having her recent hemodialysis and fluid removal due to soft blood pressure, some evidence of volume overload on imaging, mildly tachypneic with lower extremity edema.   ESRD -HD planned for today per nephrology team   Hypertension -HD planned for today; resume home regimen    Paroxysmal A-fib -Not anticoagulation candidate due to history of GI bleed in the past and symptomatic anemia requiring multiple transfusions. -Please see above discussion regarding holding both metoprolol and amiodarone,    Mixed hyperlipidemia -Continue with Crestor   Hypothyroidism -continue with Synthroid  DVT prophylaxis: SCDs Code Status: Full  Family Communication:  Disposition: possible DC home after HD today if stable    Consultants:  Nephrology   Procedures:  HD 3/1  Antimicrobials:    Subjective: No specific complaints, intermittent confusion.    Objective: Vitals:   09/09/23 0800 09/09/23 0807 09/09/23 0927 09/09/23 1149  BP: (!) 194/75  (!) 156/88   Pulse: 61  66  Resp: (!) 23  20   Temp:  98.4 F (36.9 C)  98.3 F (36.8 C)  TempSrc:  Oral  Oral  SpO2: 99%  100%   Weight:      Height:        Intake/Output Summary (Last 24 hours) at 09/09/2023 1301 Last data filed at 09/09/2023 0950 Gross per 24 hour  Intake  240 ml  Output --  Net 240 ml   Filed Weights   09/08/23 1335 09/08/23 2200  Weight: 50.1 kg 53.5 kg   Examination:  General exam: Appears chronically ill but calm and comfortable  Respiratory system: crackles heard posteriorly. Respiratory effort normal. Cardiovascular system: normal S1 & S2 heard. No JVD, murmurs, rubs, gallops or clicks. No pedal edema. Gastrointestinal system: Abdomen is nondistended, soft and nontender. No organomegaly or masses felt. Normal bowel sounds heard. Central nervous system: Alert and oriented to person, place only. No focal neurological deficits. Extremities: Symmetric 5 x 5 power. Skin: No rashes, lesions or ulcers. Psychiatry: Judgement and insight appear diminished. Mood & affect appropriate.   Data Reviewed: I have personally reviewed following labs and imaging studies  CBC: Recent Labs  Lab 09/08/23 1401 09/09/23 0354  WBC 5.1 5.8  NEUTROABS 3.8  --   HGB 8.3* 9.2*  HCT 27.1* 30.1*  MCV 108.8* 111.9*  PLT 206 178    Basic Metabolic Panel: Recent Labs  Lab 09/08/23 1401  NA 140  141  K 3.2*  3.2*  CL 101  103  CO2 24  26  GLUCOSE 135*  145*  BUN 26*  26*  CREATININE 4.59*  4.60*  CALCIUM 8.4*  8.1*  PHOS 2.3*    CBG: No results for input(s): "GLUCAP" in the last 168 hours.  No results found for this or any previous visit (from the past 240 hours).   Radiology Studies: CT Head Wo Contrast Result Date: 09/08/2023 CLINICAL DATA:  Altered mental status. EXAM: CT HEAD WITHOUT CONTRAST TECHNIQUE: Contiguous axial images were obtained from the base of the skull through the vertex without intravenous contrast. RADIATION DOSE REDUCTION: This exam was performed according to the departmental dose-optimization program which includes automated exposure control, adjustment of the mA and/or kV according to patient size and/or use of iterative reconstruction technique. COMPARISON:  Head CT dated 04/10/2023. FINDINGS: Evaluation of  this exam is limited due to motion artifact. Brain: Moderate age-related atrophy and chronic microvascular ischemic changes. There is no acute intracranial hemorrhage. No mass effect or midline shift. No extra-axial fluid collection. Vascular: No hyperdense vessel or unexpected calcification. Skull: Normal. Negative for fracture or focal lesion. Sinuses/Orbits: There is mucoperiosteal thickening of paranasal sinuses. No air-fluid level. The mastoid air cells are clear. Other: None IMPRESSION: 1. No acute intracranial pathology. 2. Moderate age-related atrophy and chronic microvascular ischemic changes. Electronically Signed   By: Elgie Collard M.D.   On: 09/08/2023 18:42   DG Chest Port 1 View Result Date: 09/08/2023 CLINICAL DATA:  Weakness entire.  Bradycardia EXAM: PORTABLE CHEST 1 VIEW COMPARISON:  X-ray 08/19/2023 FINDINGS: Enlarged cardiopericardial silhouette with sternal wires. Double-lumen left IJ catheter with tip overlying the right atrium. Small bilateral pleural effusions with adjacent opacities. The opacities are increased from previous. No pneumothorax. Vascular gesture with some interstitial changes, possibly edema. Fluid tracks along the minor fissure. Overlapping cardiac leads. Vascular stent along the left upper extremity at the edge of the imaging field. Patient is tilted IMPRESSION: Enlarged heart with vascular congestion and trace edema. Small bilateral pleural  effusions. Lung base opacities are seen, increased on the right. Left IJ double-lumen catheter with tip overlying the right atrium. Electronically Signed   By: Karen Kays M.D.   On: 09/08/2023 16:14    Scheduled Meds:  [START ON 09/11/2023] calcitRIOL  1.75 mcg Oral Q M,W,F-HD   Chlorhexidine Gluconate Cloth  6 each Topical Daily   [START ON 09/11/2023] cinacalcet  30 mg Oral Q M,W,F-HD   levETIRAcetam  500 mg Oral Daily   levothyroxine  50 mcg Oral Daily   multivitamin  1 tablet Oral Daily   rosuvastatin  10 mg Oral Daily    Continuous Infusions:   LOS: 1 day   Time spent: 55 mins  Eryka Dolinger [name], MD How to contact the [facility] Attending or Consulting provider 7A - 7P or covering provider during after hours 7P -7A, for this patient?  Check the care team in [facility] and look for a) attending/consulting [facility] provider listed and b) the [facility] team listed Log into [email or web address] to find provider on call.  Locate the [facility] provider you are looking for under [facility] and page to a number that you can be directly reached. If you still have difficulty reaching the provider, please page the [facility] (Director on Call) for the Hospitalists listed on amion for assistance.  09/09/2023, 1:01 PM

## 2023-09-09 NOTE — Hospital Course (Signed)
 83 y.o. female,  with medical history significant of ESRD on hemodialysis (MWF), CAD status post CABG, HFrEF, T2DM, hypertension, GERD, atrial fibrillation (not on anticoagulants), patient is well know to our service, given multiple admissions in the past, he is extremely frail, deconditioned, with poor baseline requiring multiple admissions in the past for different reasons.  -Jent was brought to ED by her husband for weakness, her husband apparently patient over last few hemodialysis sessions were not getting any fluid removals due to her soft blood pressure, as well she has not been able to finish a full session of treatment, she has been very weak, somnolent today where she could not go to dialysis so he brought her to ED, she is herself somnolent currently, very poor historian, but she does report generalized weakness, she denies any pain, fever, chills. -In ED her workup significant for bradycardia, she was noted to be sinus bradycardia on EKG at 37 bpm, apparently has been still giving her amiodarone even so it was discontinued on the past, and she is still on metoprolol scheduled while supposed to be with holding parameters as well, CT head with no acute findings, labs significant for potassium 3.2, BUN of 26, hemoglobin at 8.3, x-ray with some evidence of volume overload, given her somnolent, bradycardia and volume overload [facility] hospitalist consulted to admit.

## 2023-09-10 DIAGNOSIS — I482 Chronic atrial fibrillation, unspecified: Secondary | ICD-10-CM | POA: Diagnosis not present

## 2023-09-10 DIAGNOSIS — N186 End stage renal disease: Secondary | ICD-10-CM | POA: Diagnosis not present

## 2023-09-10 DIAGNOSIS — R001 Bradycardia, unspecified: Secondary | ICD-10-CM | POA: Diagnosis not present

## 2023-09-10 DIAGNOSIS — G9341 Metabolic encephalopathy: Secondary | ICD-10-CM | POA: Diagnosis not present

## 2023-09-10 LAB — HEPATITIS B SURFACE ANTIBODY, QUANTITATIVE: Hep B S AB Quant (Post): 225 m[IU]/mL

## 2023-09-10 LAB — HEPATITIS B SURFACE ANTIGEN: Hepatitis B Surface Ag: NONREACTIVE

## 2023-09-10 MED ORDER — FAMOTIDINE 20 MG PO TABS: 20.0000 mg | ORAL_TABLET | Freq: Every day | ORAL | Status: DC | PRN

## 2023-09-10 MED ORDER — HYDRALAZINE HCL 10 MG PO TABS
10.0000 mg | ORAL_TABLET | Freq: Two times a day (BID) | ORAL | Status: DC
  Administered 2023-09-10: 10 mg via ORAL
  Filled 2023-09-10: qty 1

## 2023-09-10 NOTE — Progress Notes (Signed)
   09/10/23 0923  TOC Brief Assessment  Insurance and Status Reviewed  Patient has primary care physician Yes  Home environment has been reviewed From home  Prior level of function: Need Assistance (HD)  Prior/Current Home Services No current home services  Social Drivers of Health Review SDOH reviewed no interventions necessary  Readmission risk has been reviewed Yes (Red -Assessed within last 30 days)  Transition of care needs transition of care needs identified, TOC will continue to follow

## 2023-09-10 NOTE — Progress Notes (Signed)
   HEMODIALYSIS TREATMENT NOTE:  HD was initiated with a 2 liter goal but BP began dropping during the last hour of treatment.  UF was paused, NS bolus was given x2, and goal was lowered.  Net UF 1 liter.  All blood was returned.  Post-HD:  09/10/23 0115  Vitals  Temp 97.7 F (36.5 C)  Temp Source Axillary  BP (!) 155/53  MAP (mmHg) 81  BP Location Right Wrist  BP Method Automatic  Patient Position (if appropriate) Lying  Pulse Rate 68  Pulse Rate Source Monitor  ECG Heart Rate 69  Resp 20  Post Treatment  Dialyzer Clearance Lightly streaked  Hemodialysis Intake (mL) 200 mL  Liters Processed 62  Fluid Removed (mL) 1000 mL  Tolerated HD Treatment No (Comment)  Post-Hemodialysis Comments See PN  Hemodialysis Catheter Double lumen Permanent (Tunneled)  No placement date or time found.   Placed prior to admission: Yes  Hemodialysis Catheter Type: Double lumen Permanent (Tunneled)  Site Condition No complications  Blue Lumen Status Flushed;Heparin locked;Dead end cap in place  Red Lumen Status Flushed;Heparin locked;Dead end cap in place  Purple Lumen Status N/A  Catheter fill solution Heparin 1000 units/ml  Catheter fill volume (Arterial) 1.9 cc  Catheter fill volume (Venous) 1.9  Dressing Type Transparent;Tube stabilization device  Dressing Status Antimicrobial disc/dressing in place;Clean, Dry, Intact  Interventions Dressing reinforced  Drainage Description None  Dressing Change Due 09/15/23  Post treatment catheter status Capped and Clamped    Arman Filter, RN AP [facility]

## 2023-09-10 NOTE — Discharge Summary (Signed)
 Physician Discharge Summary  GERLEAN CID ZOX:[id number] DOB: 04/08/1941 DOA: 09/08/2023  PCP: [facility]  Admit date: 09/08/2023 Discharge date: 09/10/2023  Admitted From:  Home  Disposition: Home   Recommendations for Outpatient Follow-up:  Follow up with PCP in 1 weeks Go to outpatient dialysis tomorrow on schedule Stop taking metoprolol and amiodarone due to bradycardia   Discharge Condition: STABLE   CODE STATUS: FULL DIET: renal / carb    Brief Hospitalization Summary: Please see all hospital notes, images, labs for full details of the hospitalization. Admission provider HPI:  83 y.o. female,  with medical history significant of ESRD on hemodialysis (MWF), CAD status post CABG, HFrEF, T2DM, hypertension, GERD, atrial fibrillation (not on anticoagulants), patient is well know to our service, given multiple admissions in the past, he is extremely frail, deconditioned, with poor baseline requiring multiple admissions in the past for different reasons.  -Jent was brought to ED by her husband for weakness, her husband apparently patient over last few hemodialysis sessions were not getting any fluid removals due to her soft blood pressure, as well she has not been able to finish a full session of treatment, she has been very weak, somnolent today where she could not go to dialysis so he brought her to ED, she is herself somnolent currently, very poor historian, but she does report generalized weakness, she denies any pain, fever, chills. -In ED her workup significant for bradycardia, she was noted to be sinus bradycardia on EKG at 37 bpm, apparently has been still giving her amiodarone even so it was discontinued on the past, and she is still on metoprolol scheduled while supposed to be with holding parameters as well, CT head with no acute findings, labs significant for potassium 3.2, BUN of 26, hemoglobin at 8.3, x-ray with some evidence of volume overload, given her somnolent,  bradycardia and volume overload [facility] hospitalist consulted to admit.  Hospital Course by problems listed  Symptomatic Bradycardia - RESOLVED  -Heart rate 37 on EKG, in setting he still taking amiodarone and metoprolol which has been discontinued and HRs have rebounded and have stabilized with no recurrence.  Pt advised to please stop taking these.  -Have discussed with her husband, I have informed him she was supposed to stop taking amiodarone and metoprolol.   Weakness Acute metabolic encephalopathy - RESOLVED  -Patient with significant deconditioning at baseline, but she is significantly weak over last few days prior to arrival, and altered, this is most likely worsened by her bradycardia. -CT head with no acute findings -She seemed to be back to her baseline after correcting the bradycardia.    Acute on chronic combined systolic and diastolic CHF -Echocardiogram from 08/22/23 with EF of 45 to 50% with grade 2 diastolic dysfunction  -Volume management with HD, she received HD on 09/09/23 and should go to outpatient HD on 09/11/23 on schedule.    ESRD -HD planned per nephrology team -- she received HD on 09/09/23 and should go to outpatient HD on 09/11/23 on schedule   Hypertension - resume home hydralazine with hold parameters    Paroxysmal A-fib -Not anticoagulation candidate due to history of GI bleed in the past and symptomatic anemia requiring multiple transfusions. -Please see above discussion regarding holding both metoprolol and amiodarone due to symptomatic bradycardia     Mixed hyperlipidemia -Continue with Crestor   Hypothyroidism -continue with Synthroid  Discharge Diagnoses:  Principal Problem:   Bradycardia Active Problems:   ESRD on hemodialysis (HCC)   CAD (  coronary artery disease)   Diabetes mellitus type 2 in nonobese [facility])   Acquired hypothyroidism   Atrial fibrillation, chronic (HCC)   Acute metabolic encephalopathy   Discharge Instructions:  Allergies as  of 09/10/2023       Reactions   Amlodipine Swelling   Aspirin Other (See Comments)   High Doses Mess up her stomach; "makes my bowels have blood in them". Takes 81 mg EC Aspirin    Nitrofurantoin Hives   Ranexa [ranolazine] Other (See Comments)   Myoclonus-hospitalized    Bactrim [sulfamethoxazole-trimethoprim] Rash   Iodinated Contrast Media Itching, Other (See Comments)   Iron Itching, Other (See Comments)   "they gave me iron in dialysis; had to give me Benadryl cause I had to have the iron" (05/02/2012)   Tylenol [acetaminophen] Itching, Other (See Comments)   Makes her feet on fire per pt - can tolerate per pt   Gabapentin Other (See Comments)   Unknown reaction   Sucroferric Oxyhydroxide Other (See Comments)   Unknown   Levaquin [levofloxacin In D5w] Rash   Levofloxacin Rash, Other (See Comments)   Plavix [clopidogrel Bisulfate] Rash   Protonix [pantoprazole Sodium] Rash   Venofer [ferric Oxide] Itching, Other (See Comments)   Patient reports using Benadryl prior to doses as [facility]        Medication List     STOP taking these medications    amiodarone 200 MG tablet Commonly known as: PACERONE   benzonatate 100 MG capsule Commonly known as: TESSALON   cyclobenzaprine 5 MG tablet Commonly known as: FLEXERIL   metoprolol tartrate 25 MG tablet Commonly known as: LOPRESSOR       TAKE these medications    calcitRIOL 0.25 MCG capsule Commonly known as: ROCALTROL Take 7 capsules (1.75 mcg total) by mouth every Monday, Wednesday, and Friday with hemodialysis.   cinacalcet 30 MG tablet Commonly known as: SENSIPAR Take 1 tablet (30 mg total) by mouth every Monday, Wednesday, and Friday with hemodialysis.   cyanocobalamin 1000 MCG/ML injection Commonly known as: VITAMIN B12 Inject 1,000 mcg into the muscle every 30 (thirty) days.   famotidine 20 MG tablet Commonly known as: Pepcid Take 1 tablet (20 mg total) by mouth daily as needed for heartburn or  indigestion.   folic acid 1 MG tablet Commonly known as: FOLVITE Take 1 mg by mouth daily.   hydrALAZINE 25 MG tablet Commonly known as: APRESOLINE Take 1 tablet (25 mg total) by mouth 2 (two) times daily. Hold for sbp <110   hydrOXYzine 25 MG tablet Commonly known as: ATARAX Take 25 mg by mouth every 6 (six) hours as needed for itching.   levETIRAcetam 500 MG tablet Commonly known as: KEPPRA Take 1 tablet (500 mg total) by mouth daily.   levothyroxine 50 MCG tablet Commonly known as: SYNTHROID Take 50 mcg by mouth daily.   multivitamin Tabs tablet Take 1 tablet by mouth daily.   nitroGLYCERIN 0.4 MG SL tablet Commonly known as: NITROSTAT Place 0.4 mg under the tongue every 5 (five) minutes as needed for chest pain.   ondansetron 4 MG disintegrating tablet Commonly known as: ZOFRAN-ODT Take 4 mg by mouth every 8 (eight) hours as needed for nausea or vomiting.   rosuvastatin 10 MG tablet Commonly known as: CRESTOR Take 1 tablet by mouth once daily   SandoSTATIN LAR Depot 20 MG injection Generic drug: octreotide Inject 20 mg into the muscle every 28 (twenty-eight) days.        Follow-up Information  [facility] Follow up.   Why: Hospital Follow Up Contact information: [address] [place] [address] [phone]         hemodialysis treatments Follow up in 1 day(s).                 Allergies  Allergen Reactions   Amlodipine Swelling   Aspirin Other (See Comments)    High Doses Mess up her stomach; "makes my bowels have blood in them". Takes 81 mg EC Aspirin    Nitrofurantoin Hives   Ranexa [Ranolazine] Other (See Comments)    Myoclonus-hospitalized    Bactrim [Sulfamethoxazole-Trimethoprim] Rash   Iodinated Contrast Media Itching and Other (See Comments)   Iron Itching and Other (See Comments)    "they gave me iron in dialysis; had to give me Benadryl cause I had to have the iron" (05/02/2012)   Tylenol [Acetaminophen]  Itching and Other (See Comments)    Makes her feet on fire per pt - can tolerate per pt   Gabapentin Other (See Comments)    Unknown reaction   Sucroferric Oxyhydroxide Other (See Comments)    Unknown   Levaquin [Levofloxacin In D5w] Rash   Levofloxacin Rash and Other (See Comments)   Plavix [Clopidogrel Bisulfate] Rash   Protonix [Pantoprazole Sodium] Rash   Venofer [Ferric Oxide] Itching and Other (See Comments)    Patient reports using Benadryl prior to doses as [facility]   Allergies as of 09/10/2023       Reactions   Amlodipine Swelling   Aspirin Other (See Comments)   High Doses Mess up her stomach; "makes my bowels have blood in them". Takes 81 mg EC Aspirin    Nitrofurantoin Hives   Ranexa [ranolazine] Other (See Comments)   Myoclonus-hospitalized    Bactrim [sulfamethoxazole-trimethoprim] Rash   Iodinated Contrast Media Itching, Other (See Comments)   Iron Itching, Other (See Comments)   "they gave me iron in dialysis; had to give me Benadryl cause I had to have the iron" (05/02/2012)   Tylenol [acetaminophen] Itching, Other (See Comments)   Makes her feet on fire per pt - can tolerate per pt   Gabapentin Other (See Comments)   Unknown reaction   Sucroferric Oxyhydroxide Other (See Comments)   Unknown   Levaquin [levofloxacin In D5w] Rash   Levofloxacin Rash, Other (See Comments)   Plavix [clopidogrel Bisulfate] Rash   Protonix [pantoprazole Sodium] Rash   Venofer [ferric Oxide] Itching, Other (See Comments)   Patient reports using Benadryl prior to doses as [facility]        Medication List     STOP taking these medications    amiodarone 200 MG tablet Commonly known as: PACERONE   benzonatate 100 MG capsule Commonly known as: TESSALON   cyclobenzaprine 5 MG tablet Commonly known as: FLEXERIL   metoprolol tartrate 25 MG tablet Commonly known as: LOPRESSOR       TAKE these medications    calcitRIOL 0.25 MCG capsule Commonly known as:  ROCALTROL Take 7 capsules (1.75 mcg total) by mouth every Monday, Wednesday, and Friday with hemodialysis.   cinacalcet 30 MG tablet Commonly known as: SENSIPAR Take 1 tablet (30 mg total) by mouth every Monday, Wednesday, and Friday with hemodialysis.   cyanocobalamin 1000 MCG/ML injection Commonly known as: VITAMIN B12 Inject 1,000 mcg into the muscle every 30 (thirty) days.   famotidine 20 MG tablet Commonly known as: Pepcid Take 1 tablet (20 mg total) by mouth daily as needed  for heartburn or indigestion.   folic acid 1 MG tablet Commonly known as: FOLVITE Take 1 mg by mouth daily.   hydrALAZINE 25 MG tablet Commonly known as: APRESOLINE Take 1 tablet (25 mg total) by mouth 2 (two) times daily. Hold for sbp <110   hydrOXYzine 25 MG tablet Commonly known as: ATARAX Take 25 mg by mouth every 6 (six) hours as needed for itching.   levETIRAcetam 500 MG tablet Commonly known as: KEPPRA Take 1 tablet (500 mg total) by mouth daily.   levothyroxine 50 MCG tablet Commonly known as: SYNTHROID Take 50 mcg by mouth daily.   multivitamin Tabs tablet Take 1 tablet by mouth daily.   nitroGLYCERIN 0.4 MG SL tablet Commonly known as: NITROSTAT Place 0.4 mg under the tongue every 5 (five) minutes as needed for chest pain.   ondansetron 4 MG disintegrating tablet Commonly known as: ZOFRAN-ODT Take 4 mg by mouth every 8 (eight) hours as needed for nausea or vomiting.   rosuvastatin 10 MG tablet Commonly known as: CRESTOR Take 1 tablet by mouth once daily   SandoSTATIN LAR Depot 20 MG injection Generic drug: octreotide Inject 20 mg into the muscle every 28 (twenty-eight) days.        Procedures/Studies: CT Head Wo Contrast Result Date: 09/08/2023 CLINICAL DATA:  Altered mental status. EXAM: CT HEAD WITHOUT CONTRAST TECHNIQUE: Contiguous axial images were obtained from the base of the skull through the vertex without intravenous contrast. RADIATION DOSE REDUCTION: This exam  was performed according to the departmental dose-optimization program which includes automated exposure control, adjustment of the mA and/or kV according to patient size and/or use of iterative reconstruction technique. COMPARISON:  Head CT dated 04/10/2023. FINDINGS: Evaluation of this exam is limited due to motion artifact. Brain: Moderate age-related atrophy and chronic microvascular ischemic changes. There is no acute intracranial hemorrhage. No mass effect or midline shift. No extra-axial fluid collection. Vascular: No hyperdense vessel or unexpected calcification. Skull: Normal. Negative for fracture or focal lesion. Sinuses/Orbits: There is mucoperiosteal thickening of paranasal sinuses. No air-fluid level. The mastoid air cells are clear. Other: None IMPRESSION: 1. No acute intracranial pathology. 2. Moderate age-related atrophy and chronic microvascular ischemic changes. Electronically Signed   By: Elgie Collard M.D.   On: 09/08/2023 18:42   DG Chest Port 1 View Result Date: 09/08/2023 CLINICAL DATA:  Weakness entire.  Bradycardia EXAM: PORTABLE CHEST 1 VIEW COMPARISON:  X-ray 08/19/2023 FINDINGS: Enlarged cardiopericardial silhouette with sternal wires. Double-lumen left IJ catheter with tip overlying the right atrium. Small bilateral pleural effusions with adjacent opacities. The opacities are increased from previous. No pneumothorax. Vascular gesture with some interstitial changes, possibly edema. Fluid tracks along the minor fissure. Overlapping cardiac leads. Vascular stent along the left upper extremity at the edge of the imaging field. Patient is tilted IMPRESSION: Enlarged heart with vascular congestion and trace edema. Small bilateral pleural effusions. Lung base opacities are seen, increased on the right. Left IJ double-lumen catheter with tip overlying the right atrium. Electronically Signed   By: Karen Kays M.D.   On: 09/08/2023 16:14   ECHOCARDIOGRAM COMPLETE Result Date: 08/22/2023     ECHOCARDIOGRAM REPORT   Patient Name:   Shawna [name] Date of Exam: 08/22/2023 Medical Rec #:  [id number]    Height:       61.0 in Accession #:    [id number]   Weight:       103.2 lb Date of Birth:  04-02-1941    BSA:  1.426 m Patient Age:    82 years     BP:           140/51 mmHg Patient Gender: F            HR:           59 bpm. [facility] Procedure: 2D Echo, Color Doppler and Cardiac Doppler Indications:    Elevated Troponin  History:        Patient has prior history of Echocardiogram examinations, most                 recent 07/16/2021. CAD and Previous Myocardial Infarction, Prior                 CABG, Arrythmias:LBBB and Bradycardia; Risk                 Factors:Hypertension, Diabetes and Dyslipidemia. ESRD on                 hemodialysis.  Sonographer:    Celesta Gentile RCS Referring Phys: [id number] RAVI PAHWANI IMPRESSIONS  1. Left ventricular ejection fraction, by estimation, is 45 to 50%. The left ventricle has mildly decreased function. The left ventricle demonstrates regional wall motion abnormalities (see scoring diagram/findings for description). Left ventricular diastolic parameters are consistent with Grade II diastolic dysfunction (pseudonormalization). Elevated left atrial pressure.  2. Right ventricular systolic function is moderately reduced. The right ventricular size is normal. There is moderately elevated pulmonary artery systolic pressure.  3. Left atrial size was moderately dilated.  4. The mitral valve is normal in structure. Mild mitral valve regurgitation. No evidence of mitral stenosis.  5. The aortic valve is tricuspid. There is moderate calcification of the aortic valve. Aortic valve regurgitation is not visualized. No aortic stenosis is present.  6. The inferior vena cava is normal in size with greater than 50% respiratory variability, suggesting right atrial pressure of 3 mmHg. Comparison(s): Changes from prior study are noted. LVEF improved from 30-35% in 2023 to  45-50% now. FINDINGS  Left Ventricle: Left ventricular ejection fraction, by estimation, is 45 to 50%. The left ventricle has mildly decreased function. The left ventricle demonstrates regional wall motion abnormalities. The left ventricular internal cavity size was normal in size. There is no left ventricular hypertrophy. Left ventricular diastolic parameters are consistent with Grade II diastolic dysfunction (pseudonormalization). Elevated left atrial pressure.  LV Wall Scoring: The anterior septum is akinetic. The antero-lateral wall, inferior septum, and apical lateral segment are hypokinetic. The entire anterior wall, entire inferior wall, posterior wall, and apex are normal. Right Ventricle: The right ventricular size is normal. No increase in right ventricular wall thickness. Right ventricular systolic function is moderately reduced. There is moderately elevated pulmonary artery systolic pressure. The tricuspid regurgitant velocity is 3.28 m/s, and with an assumed right atrial pressure of 15 mmHg, the estimated right ventricular systolic pressure is 58.0 mmHg. Left Atrium: Left atrial size was moderately dilated. Right Atrium: Right atrial size was normal in size. Pericardium: There is no evidence of pericardial effusion. Mitral Valve: The mitral valve is normal in structure. Mild mitral valve regurgitation. No evidence of mitral valve stenosis. Tricuspid Valve: The tricuspid valve is normal in structure. Tricuspid valve regurgitation is mild . No evidence of tricuspid stenosis. Aortic Valve: The aortic valve is tricuspid. There is moderate calcification of the aortic valve. Aortic valve regurgitation is not visualized. No aortic stenosis is present. Pulmonic Valve: The pulmonic valve was not well visualized. Pulmonic  valve regurgitation is trivial. No evidence of pulmonic stenosis. Aorta: The aortic root is normal in size and structure. Venous: The inferior vena cava is normal in size with greater than 50%  respiratory variability, suggesting right atrial pressure of 3 mmHg. IAS/Shunts: No atrial level shunt detected by color flow Doppler.  LEFT VENTRICLE PLAX 2D LVIDd:         3.70 cm   Diastology LVIDs:         3.00 cm   LV e' medial:    2.70 cm/s LV PW:         1.60 cm   LV E/e' medial:  56.3 LV IVS:        1.50 cm   LV e' lateral:   4.88 cm/s LVOT diam:     1.70 cm   LV E/e' lateral: 31.1 LV SV:         35 LV SV Index:   25 LVOT Area:     2.27 cm  RIGHT VENTRICLE RV S prime:     5.18 cm/s TAPSE (M-mode): 1.3 cm LEFT ATRIUM             Index        RIGHT ATRIUM           Index LA diam:        4.10 cm 2.88 cm/m   RA Area:     13.90 cm LA Vol (A2C):   74.4 ml 52.19 ml/m  RA Volume:   34.30 ml  24.06 ml/m LA Vol (A4C):   63.5 ml 44.54 ml/m LA Biplane Vol: 68.8 ml 48.26 ml/m  AORTIC VALVE LVOT Vmax:   75.40 cm/s LVOT Vmean:  47.900 cm/s LVOT VTI:    0.156 m  AORTA Ao Root diam: 2.70 cm MITRAL VALVE                  TRICUSPID VALVE MV Area (PHT): 3.99 cm       TR Peak grad:   43.0 mmHg MV Decel Time: 190 msec       TR Vmax:        328.00 cm/s MR Peak grad:    128.6 mmHg MR Mean grad:    83.0 mmHg    SHUNTS MR Vmax:         567.00 cm/s  Systemic VTI:  0.16 m MR Vmean:        431.0 cm/s   Systemic Diam: 1.70 cm MR PISA:         1.57 cm MR PISA Eff ROA: 8 mm MR PISA Radius:  0.50 cm MV E velocity: 152.00 cm/s MV A velocity: 92.00 cm/s MV E/A ratio:  1.65 Vishnu Priya Mallipeddi Electronically signed by Winfield Rast Mallipeddi Signature Date/Time: 08/22/2023/5:11:00 PM    Final    CT Chest Wo Contrast Result Date: 08/19/2023 CLINICAL DATA:  Left-sided chest pain for 1 day, cough EXAM: CT CHEST WITHOUT CONTRAST TECHNIQUE: Multidetector CT imaging of the chest was performed following the standard protocol without IV contrast. RADIATION DOSE REDUCTION: This exam was performed according to the departmental dose-optimization program which includes automated exposure control, adjustment of the mA and/or kV according to  patient size and/or use of iterative reconstruction technique. COMPARISON:  08/19/2023, 10/23/2022 FINDINGS: Cardiovascular: Unenhanced imaging of the heart demonstrates cardiomegaly without pericardial effusion. Postsurgical changes from prior CABG. Normal caliber of the thoracic aorta. There is extensive atherosclerosis of the aorta and native coronary vessels. Left internal jugular dialysis catheter tip within  the superior vena cava at the atriocaval junction. Mediastinum/Nodes: Mediastinal adenopathy, largest lymph node in the precarinal region measuring 15 mm. Thyroid, trachea, and esophagus are unremarkable. Lungs/Pleura: For there are small bilateral pleural effusions, volume estimated less than 500 cc each. There is patchy bibasilar airspace disease, greatest in the right middle and right lower lobes. Bibasilar bronchial wall thickening. No pneumothorax. Upper Abdomen: Bilateral renal atrophy consistent with end-stage renal disease. No acute upper abdominal findings. Musculoskeletal: No acute or destructive bony abnormalities. Severe right shoulder osteoarthritis. Reconstructed images demonstrate no additional findings. IMPRESSION: 1. Bibasilar airspace disease and bronchial wall thickening, consistent with multifocal bronchopneumonia. 2. Small free-flowing bilateral pleural effusions, right greater than left. 3. Mediastinal lymphadenopathy. 4. Cardiomegaly. 5.  Aortic Atherosclerosis (ICD10-I70.0). Electronically Signed   By: Sharlet Salina M.D.   On: 08/19/2023 22:41   DG Chest Portable 1 View Result Date: 08/19/2023 CLINICAL DATA:  Left-sided chest pain, cough EXAM: PORTABLE CHEST 1 VIEW COMPARISON:  08/05/2023 FINDINGS: Single frontal view of the chest demonstrates left internal jugular dialysis catheter, tip overlying the right atrium. Postsurgical changes from CABG. The cardiac silhouette is unremarkable. There is persistent pulmonary vascular congestion. Patchy bibasilar airspace disease and small  bilateral pleural effusions are noted, which could reflect edema or infection. No significant change since prior exam. No pneumothorax. No acute bony abnormalities. IMPRESSION: 1. Vascular congestion, with persistent bibasilar airspace disease and small bilateral pleural effusions. Overall, findings are consistent with pulmonary edema, though underlying infection would be difficult to exclude. No significant change since prior exam. Electronically Signed   By: Sharlet Salina M.D.   On: 08/19/2023 21:42     Subjective: Pt without complaints.  Pt eating breakfast.  Pt eager to go home.    Discharge Exam: Vitals:   09/10/23 0627 09/10/23 0902  BP: (!) 142/63 (!) 168/65  Pulse: 66 74  Resp: 16   Temp: 97.8 F (36.6 C)   SpO2: 95% 100%   Vitals:   09/10/23 0448 09/10/23 0500 09/10/23 0627 09/10/23 0902  BP:  (!) 162/67 (!) 142/63 (!) 168/65  Pulse:  62 66 74  Resp:  19 16   Temp: 97.6 F (36.4 C)  97.8 F (36.6 C)   TempSrc: Axillary  Oral   SpO2:  98% 95% 100%  Weight:      Height:        General: Pt is alert, awake, not in acute distress, chronically ill appearing. Cardiovascular: normal S1/S2 +, no rubs, no gallops Respiratory: CTA bilaterally, no wheezing, no rhonchi Abdominal: Soft, NT, ND, bowel sounds + Extremities: trace pretib edema, no cyanosis   The results of significant diagnostics from this hospitalization (including imaging, microbiology, ancillary and laboratory) are listed below for reference.     Microbiology: No results found for this or any previous visit (from the past 240 hours).   Labs: BNP (last 3 results) Recent Labs    10/23/22 1420 05/13/23 1500  BNP 2,205.0* 3,351.0*   Basic Metabolic Panel: Recent Labs  Lab 09/08/23 1401  NA 140  141  K 3.2*  3.2*  CL 101  103  CO2 24  26  GLUCOSE 135*  145*  BUN 26*  26*  CREATININE 4.59*  4.60*  CALCIUM 8.4*  8.1*  PHOS 2.3*   Liver Function Tests: Recent Labs  Lab 09/08/23 1401   ALBUMIN 2.9*   No results for input(s): "LIPASE", "AMYLASE" in the last 168 hours. No results for input(s): "AMMONIA" in the last 168 hours. CBC:  Recent Labs  Lab 09/08/23 1401 09/09/23 0354  WBC 5.1 5.8  NEUTROABS 3.8  --   HGB 8.3* 9.2*  HCT 27.1* 30.1*  MCV 108.8* 111.9*  PLT 206 178   Cardiac Enzymes: No results for input(s): "CKTOTAL", "CKMB", "CKMBINDEX", "TROPONINI" in the last 168 hours. BNP: Invalid input(s): "POCBNP" CBG: No results for input(s): "GLUCAP" in the last 168 hours. D-Dimer No results for input(s): "DDIMER" in the last 72 hours. Hgb A1c No results for input(s): "HGBA1C" in the last 72 hours. Lipid Profile No results for input(s): "CHOL", "HDL", "LDLCALC", "TRIG", "CHOLHDL", "LDLDIRECT" in the last 72 hours. Thyroid function studies No results for input(s): "TSH", "T4TOTAL", "T3FREE", "THYROIDAB" in the last 72 hours.  Invalid input(s): "FREET3" Anemia work up No results for input(s): "VITAMINB12", "FOLATE", "FERRITIN", "TIBC", "IRON", "RETICCTPCT" in the last 72 hours. Urinalysis    Component Value Date/Time   COLORURINE YELLOW 12/16/2012 1919   APPEARANCEUR CLOUDY (A) 12/16/2012 1919   LABSPEC 1.009 12/16/2012 1919   PHURINE 7.5 12/16/2012 1919   GLUCOSEU NEGATIVE 12/16/2012 1919   HGBUR TRACE (A) 12/16/2012 1919   BILIRUBINUR NEGATIVE 12/16/2012 1919   KETONESUR NEGATIVE 12/16/2012 1919   PROTEINUR 100 (A) 12/16/2012 1919   UROBILINOGEN 0.2 12/16/2012 1919   NITRITE NEGATIVE 12/16/2012 1919   LEUKOCYTESUR SMALL (A) 12/16/2012 1919   Sepsis Labs Recent Labs  Lab 09/08/23 1401 09/09/23 0354  WBC 5.1 5.8   Microbiology No results found for this or any previous visit (from the past 240 hours).  Time coordinating discharge: 33 mins   SIGNED:  Standley Dakins, MD  [facility] 09/10/2023, 10:14 AM How to contact the [facility] Attending or Consulting provider 7A - 7P or covering provider during after hours 7P -7A, for this patient?   Check the care team in [facility] and look for a) attending/consulting [facility] provider listed and b) the [facility] team listed Log into [email or web address] and use [facility]'s universal password to access. If you do not have the password, please contact the hospital operator. Locate the [facility] provider you are looking for under [facility] and page to a number that you can be directly reached. If you still have difficulty reaching the provider, please page the [facility] (Director on Call) for the Hospitalists listed on amion for assistance.

## 2023-09-10 NOTE — Discharge Instructions (Signed)
 PLEASE STOP TAKING AMIODARONE AND METOPROLOL PLEASE RESUME OUTPATIENT HEMODIALYSIS TOMORROW AS SCHEDULED   IMPORTANT INFORMATION: PAY CLOSE ATTENTION   PHYSICIAN DISCHARGE INSTRUCTIONS  Follow with Primary care provider  [facility], [facility]  and other consultants as instructed by your Hospitalist Physician  SEEK MEDICAL CARE OR RETURN TO EMERGENCY ROOM IF SYMPTOMS COME BACK, WORSEN OR NEW PROBLEM DEVELOPS   Please note: You were cared for by a hospitalist during your hospital stay. Every effort will be made to forward records to your primary care provider.  You can request that your primary care provider send for your hospital records if they have not received them.  Once you are discharged, your primary care physician will handle any further medical issues. Please note that NO REFILLS for any discharge medications will be authorized once you are discharged, as it is imperative that you return to your primary care physician (or establish a relationship with a primary care physician if you do not have one) for your post hospital discharge needs so that they can reassess your need for medications and monitor your lab values.  Please get a complete blood count and chemistry panel checked by your Primary MD at your next visit, and again as instructed by your Primary MD.  Get Medicines reviewed and adjusted: Please take all your medications with you for your next visit with your Primary MD  Laboratory/radiological data: Please request your Primary MD to go over all hospital tests and procedure/radiological results at the follow up, please ask your primary care provider to get all Hospital records sent to his/her office.  In some cases, they will be blood work, cultures and biopsy results pending at the time of your discharge. Please request that your primary care provider follow up on these results.  If you are diabetic, please bring your blood sugar readings with you to your follow up  appointment with primary care.    Please call and make your follow up appointments as soon as possible.    Also Note the following: If you experience worsening of your admission symptoms, develop shortness of breath, life threatening emergency, suicidal or homicidal thoughts you must seek medical attention immediately by calling 911 or calling your MD immediately  if symptoms less severe.  You must read complete instructions/literature along with all the possible adverse reactions/side effects for all the Medicines you take and that have been prescribed to you. Take any new Medicines after you have completely understood and accpet all the possible adverse reactions/side effects.   Do not drive when taking Pain medications or sleeping medications (Benzodiazepines)  Do not take more than prescribed Pain, Sleep and Anxiety Medications. It is not advisable to combine anxiety,sleep and pain medications without talking with your primary care practitioner  Special Instructions: If you have smoked or chewed Tobacco  in the last 2 yrs please stop smoking, stop any regular Alcohol  and or any Recreational drug use.  Wear Seat belts while driving.  Do not drive if taking any narcotic, mind altering or controlled substances or recreational drugs or alcohol.

## 2023-09-11 LAB — GLUCOSE, CAPILLARY: Glucose-Capillary: 104 mg/dL — ABNORMAL HIGH (ref 70–99)

## 2023-09-16 ENCOUNTER — Emergency Department (HOSPITAL_COMMUNITY)
Admission: EM | Admit: 2023-09-16 | Discharge: 2023-09-16 | Disposition: A | Discharge status: Home / Self Care | Attending: Emergency Medicine | Admitting: Emergency Medicine

## 2023-09-16 ENCOUNTER — Encounter (HOSPITAL_COMMUNITY): Payer: Self-pay

## 2023-09-16 ENCOUNTER — Other Ambulatory Visit: Payer: Self-pay

## 2023-09-16 ENCOUNTER — Emergency Department (HOSPITAL_COMMUNITY)

## 2023-09-16 DIAGNOSIS — R0981 Nasal congestion: Secondary | ICD-10-CM | POA: Insufficient documentation

## 2023-09-16 DIAGNOSIS — Z992 Dependence on renal dialysis: Secondary | ICD-10-CM | POA: Diagnosis not present

## 2023-09-16 DIAGNOSIS — I2510 Atherosclerotic heart disease of native coronary artery without angina pectoris: Secondary | ICD-10-CM | POA: Insufficient documentation

## 2023-09-16 DIAGNOSIS — D631 Anemia in chronic kidney disease: Secondary | ICD-10-CM | POA: Diagnosis not present

## 2023-09-16 DIAGNOSIS — R5383 Other fatigue: Secondary | ICD-10-CM

## 2023-09-16 DIAGNOSIS — N186 End stage renal disease: Secondary | ICD-10-CM | POA: Insufficient documentation

## 2023-09-16 LAB — CBC WITH DIFFERENTIAL/PLATELET
Abs Immature Granulocytes: 0.02 10*3/uL (ref 0.00–0.07)
Basophils Absolute: 0 10*3/uL (ref 0.0–0.1)
Basophils Relative: 1 %
Eosinophils Absolute: 0.1 10*3/uL (ref 0.0–0.5)
Eosinophils Relative: 2 %
HCT: 23.4 % — ABNORMAL LOW (ref 36.0–46.0)
Hemoglobin: 7.4 g/dL — ABNORMAL LOW (ref 12.0–15.0)
Immature Granulocytes: 1 %
Lymphocytes Relative: 12 %
Lymphs Abs: 0.5 10*3/uL — ABNORMAL LOW (ref 0.7–4.0)
MCH: 34.6 pg — ABNORMAL HIGH (ref 26.0–34.0)
MCHC: 31.6 g/dL (ref 30.0–36.0)
MCV: 109.3 fL — ABNORMAL HIGH (ref 80.0–100.0)
Monocytes Absolute: 0.6 10*3/uL (ref 0.1–1.0)
Monocytes Relative: 14 %
Neutro Abs: 3.1 10*3/uL (ref 1.7–7.7)
Neutrophils Relative %: 70 %
Platelets: 174 10*3/uL (ref 150–400)
RBC: 2.14 MIL/uL — ABNORMAL LOW (ref 3.87–5.11)
RDW: 25.6 % — ABNORMAL HIGH (ref 11.5–15.5)
WBC: 4.3 10*3/uL (ref 4.0–10.5)
nRBC: 0 % (ref 0.0–0.2)

## 2023-09-16 LAB — RESP PANEL BY RT-PCR (RSV, FLU A&B, COVID)  RVPGX2
Influenza A by PCR: NEGATIVE
Influenza B by PCR: NEGATIVE
Resp Syncytial Virus by PCR: NEGATIVE
SARS Coronavirus 2 by RT PCR: NEGATIVE

## 2023-09-16 LAB — COMPREHENSIVE METABOLIC PANEL
ALT: 14 U/L (ref 0–44)
AST: 23 U/L (ref 15–41)
Albumin: 2.9 g/dL — ABNORMAL LOW (ref 3.5–5.0)
Alkaline Phosphatase: 160 U/L — ABNORMAL HIGH (ref 38–126)
Anion gap: 13 (ref 5–15)
BUN: 23 mg/dL (ref 8–23)
CO2: 24 mmol/L (ref 22–32)
Calcium: 8.5 mg/dL — ABNORMAL LOW (ref 8.9–10.3)
Chloride: 97 mmol/L — ABNORMAL LOW (ref 98–111)
Creatinine, Ser: 2.76 mg/dL — ABNORMAL HIGH (ref 0.44–1.00)
GFR, Estimated: 17 mL/min — ABNORMAL LOW (ref >60)
Glucose, Bld: 134 mg/dL — ABNORMAL HIGH (ref 70–99)
Potassium: 3.3 mmol/L — ABNORMAL LOW (ref 3.5–5.1)
Sodium: 134 mmol/L — ABNORMAL LOW (ref 135–145)
Total Bilirubin: 0.7 mg/dL (ref 0.0–1.2)
Total Protein: 6.5 g/dL (ref 6.5–8.1)

## 2023-09-16 NOTE — ED Triage Notes (Signed)
 Pt here due nasal congestion and "just not feeling up to par"

## 2023-09-16 NOTE — Discharge Instructions (Signed)
 You were seen in the emergency department today for concerns of fatigue and congestion.  You are negative for COVID-19, positive RSV.  I do believe that your symptoms are due to your anemia from chronic kidney disease.  I would recommend continuing with your dialysis sessions as planned.  I would also reach out to your nephrologist for further evaluation.  Also follow-up with your primary care provider.  For any concerns or new or worsening symptoms, return emergency department.

## 2023-09-16 NOTE — ED Notes (Signed)
 Patient discharged. Provider spoke to patient and husband. Paperwork given to patiet and husband and reviewed. Pt and husband verbalized understanding. VSS. A+Ox4. Patient wheeled out to car, transferred independently with steady gait. No Iv in place.

## 2023-09-16 NOTE — ED Provider Notes (Signed)
 [facility] EMERGENCY DEPARTMENT AT [facility] Provider Note   CSN: [id number] Arrival date & time: 09/16/23  1552     History Chief Complaint  Patient presents with   Nasal Congestion    Shawna [name] is a 83 y.o. female.  Patient with past history significant for PAD, CAD, ESRD on dialysis, GERD presents ED with concerns of nasal congestion.  Patient states that she been feeling unwell for the last several days.  Was recently admitted and discharged home for symptomatic bradycardia.  Patient states that she "just does not feel well".  She denies any recent fever, chills, body aches, nausea, vomiting, or diarrhea.  States that she is eating at baseline.  Denies any specific chest pain or shortness of breath.  HPI     Home Medications Prior to Admission medications   Medication Sig Start Date End Date Taking? Authorizing Provider  calcitRIOL (ROCALTROL) 0.25 MCG capsule Take 7 capsules (1.75 mcg total) by mouth every Monday, Wednesday, and Friday with hemodialysis. 12/21/22   Lanae Boast, MD  cinacalcet (SENSIPAR) 30 MG tablet Take 1 tablet (30 mg total) by mouth every Monday, Wednesday, and Friday with hemodialysis. 12/19/22   Lanae Boast, MD  cyanocobalamin (VITAMIN B12) 1000 MCG/ML injection Inject 1,000 mcg into the muscle every 30 (thirty) days.    [provider]  famotidine (PEPCID) 20 MG tablet Take 1 tablet (20 mg total) by mouth daily as needed for heartburn or indigestion. 09/10/23 09/09/24  Cleora Fleet, MD  folic acid (FOLVITE) 1 MG tablet Take 1 mg by mouth daily. 05/24/22   [provider]  hydrALAZINE (APRESOLINE) 25 MG tablet Take 1 tablet (25 mg total) by mouth 2 (two) times daily. Hold for sbp <110 12/27/22   Lanae Boast, MD  hydrOXYzine (ATARAX) 25 MG tablet Take 25 mg by mouth every 6 (six) hours as needed for itching. 06/06/22   [provider]  levETIRAcetam (KEPPRA) 500 MG tablet Take 1 tablet (500 mg total) by mouth daily. 11/15/22    Leroy Sea, MD  levothyroxine (SYNTHROID) 50 MCG tablet Take 50 mcg by mouth daily. 08/27/23   [provider]  multivitamin (RENA-VIT) TABS tablet Take 1 tablet by mouth daily.    [provider]  nitroGLYCERIN (NITROSTAT) 0.4 MG SL tablet Place 0.4 mg under the tongue every 5 (five) minutes as needed for chest pain.    [provider]  octreotide (SANDOSTATIN LAR) 20 MG injection Inject 20 mg into the muscle every 28 (twenty-eight) days. 11/15/22   Leroy Sea, MD  ondansetron (ZOFRAN-ODT) 4 MG disintegrating tablet Take 4 mg by mouth every 8 (eight) hours as needed for nausea or vomiting. 05/22/23   [provider]  rosuvastatin (CRESTOR) 10 MG tablet Take 1 tablet by mouth once daily 06/06/22   Iran Ouch, [place] M, PA-C      Allergies    Amlodipine, Aspirin, Nitrofurantoin, Ranexa [ranolazine], Bactrim [sulfamethoxazole-trimethoprim], Iodinated contrast media, Iron, Tylenol [acetaminophen], Gabapentin, Sucroferric oxyhydroxide, Levaquin [levofloxacin in d5w], Levofloxacin, Plavix [clopidogrel bisulfate], Protonix [pantoprazole sodium], and Venofer [ferric oxide]    Review of Systems   Review of Systems  Constitutional:  Positive for fatigue.  HENT:  Positive for congestion.   All other systems reviewed and are negative.   Physical Exam Updated Vital Signs BP (!) 133/91 (BP Location: Right Arm)   Pulse 67   Temp 98.6 F (37 C) (Oral)   Resp (!) 22   Ht 5\' 2"  (1.575 m)  Wt 52.9 kg   SpO2 93%   BMI 21.33 kg/m  Physical Exam Vitals and nursing note reviewed.  Constitutional:      General: She is not in acute distress.    Appearance: She is well-developed.  HENT:     Head: Normocephalic and atraumatic.  Eyes:     Conjunctiva/sclera: Conjunctivae normal.  Cardiovascular:     Rate and Rhythm: Normal rate and regular rhythm.     Heart sounds: No murmur heard. Pulmonary:     Effort: Pulmonary effort is normal. No respiratory  distress.     Breath sounds: Normal breath sounds.  Abdominal:     Palpations: Abdomen is soft.     Tenderness: There is no abdominal tenderness.  Musculoskeletal:        General: No swelling.     Cervical back: Neck supple.  Skin:    General: Skin is warm and dry.     Capillary Refill: Capillary refill takes less than 2 seconds.  Neurological:     Mental Status: She is alert.  Psychiatric:        Mood and Affect: Mood normal.     ED Results / Procedures / Treatments   Labs (all labs ordered are listed, but only abnormal results are displayed) Labs Reviewed  CBC WITH DIFFERENTIAL/PLATELET - Abnormal; Notable for the following components:      Result Value   RBC 2.14 (*)    Hemoglobin 7.4 (*)    HCT 23.4 (*)    MCV 109.3 (*)    MCH 34.6 (*)    RDW 25.6 (*)    Lymphs Abs 0.5 (*)    All other components within normal limits  COMPREHENSIVE METABOLIC PANEL - Abnormal; Notable for the following components:   Sodium 134 (*)    Potassium 3.3 (*)    Chloride 97 (*)    Glucose, Bld 134 (*)    Creatinine, Ser 2.76 (*)    Calcium 8.5 (*)    Albumin 2.9 (*)    Alkaline Phosphatase 160 (*)    GFR, Estimated 17 (*)    All other components within normal limits  RESP PANEL BY RT-PCR (RSV, FLU A&B, COVID)  [id number]    EKG EKG Interpretation Date/Time:  Saturday September 16 2023 16:58:12 EST Ventricular Rate:  106 PR Interval:    QRS Duration:  142 QT Interval:  411 QTC Calculation: 546 R Axis:   7  Text Interpretation: Atrial flutter Left bundle branch block since last tracing no significant change Confirmed by Eber Hong ([id number]) on 09/16/2023 5:07:09 PM  Radiology DG Chest Portable 1 View Result Date: 09/16/2023 CLINICAL DATA:  Weakness EXAM: PORTABLE CHEST 1 VIEW COMPARISON:  September 08, 2023 FINDINGS: The cardiomediastinal silhouette is unchanged and enlarged in contour.Status post median sternotomy and CABG. LEFT chest CVC with tip terminating over the RIGHT atrium. LEFT  axillary stent. Moderate bilateral pleural effusions, increased since prior. No pneumothorax. Background of interstitial prominence. Bibasilar airspace opacities. Atherosclerotic calcifications. IMPRESSION: 1. Moderate bilateral pleural effusions, increased since prior. 2. Background of pulmonary edema with bibasilar airspace opacities, likely atelectasis. Electronically Signed   By: Meda Klinefelter M.D.   On: 09/16/2023 16:56    Procedures Procedures    Medications Ordered in ED Medications - No data to display  ED Course/ Medical Decision Making/ A&P  Medical Decision Making Amount and/or Complexity of Data Reviewed Labs: ordered. Radiology: ordered.   This patient presents to the ED for concern of fatigue, nasal congestion.  Differential diagnosis includes COVID-19, viral URI, pneumonia, dehydration   Lab Tests:  I Ordered, and personally interpreted labs.  The pertinent results include: CBC with hemoglobin decline to 7.4 although patient is difficult to assess baseline with levels as low as 6.2 and as high as 9.8 in the last 3 weeks with fluctuations every few days.  Possibly secondary to ESRD and HD.  CMP without evident electrolyte changes from baseline.  Kidney function at baseline.  Respiratory panel negative.   Imaging Studies ordered:  I ordered imaging studies including chest x-ray I independently visualized and interpreted imaging which showed 1. Moderate bilateral pleural effusions, increased since prior. 2. Background of pulmonary edema with bibasilar airspace opacities, likely atelectasis. I agree with the radiologist interpretation   Problem List / ED Course:  Patient with past history significant for PAD, CAD, ESRD on dialysis, GERD presents ED with concerns of nasal congestion.  Reports this been ongoing for the last several days.  Patient has been hospitalized on 3 separate occasions in the last several weeks.  She denies any  feelings of chest pain or shortness of breath at this time.  Concerned about possible exposure to viral URI.  No sick contacts at home or anywhere else as far she is aware. Physical exam is reassuring.  Capillary fill less than 2 seconds.  No focal abdominal tenderness.  No appreciable heart murmurs or abnormal lung sounds.  Will proceed with labs and EKG and chest x-ray to assess for any possible source of patient's symptoms.  Patient appears to have a baseline feeling of fatigue so unclear if this is due to her ESRD. Chest x-ray unremarkable.  EKG unremarkable.  CBC with hemoglobin down to 7.4 which may be contributing to fatigue but difficult to ascertain if this is close to patient's baseline as she has had significant swings in this level in the last 3 weeks without blood product administration or overt signs of GI bleed.  At this time she denies any symptoms of hematochezia, hematemesis, or melanotic stools.  CMP with some mild evidence of dehydration with electrolyte decreased on sodium, potassium, chloride, and kidney function at baseline.  I suspect patient's unstable hemoglobin is likely secondary to her ESRD and not an overt sign of GI bleed.  Given no focal GI bleeding symptoms, I suspect this is all likely from ESRD.  Patient's labs are otherwise at baseline unremarkable.  I feel the patient stable for outpatient follow-up and discharge home.  Return precautions discussed and patient verbalized agreement.  Patient discharged home in stable condition.  Final Clinical Impression(s) / ED Diagnoses Final diagnoses:  Other fatigue  Nasal congestion  Anemia due to chronic kidney disease, on chronic dialysis [facility])    Rx / DC Orders ED Discharge Orders     None         Salomon Mast 09/16/23 2050    Eber Hong, MD 09/16/23 2251

## 2023-09-20 ENCOUNTER — Other Ambulatory Visit: Payer: Self-pay

## 2023-09-20 ENCOUNTER — Encounter (HOSPITAL_COMMUNITY): Payer: Self-pay | Admitting: Emergency Medicine

## 2023-09-20 ENCOUNTER — Emergency Department (HOSPITAL_COMMUNITY)

## 2023-09-20 ENCOUNTER — Emergency Department (HOSPITAL_COMMUNITY)
Admission: EM | Admit: 2023-09-20 | Discharge: 2023-09-20 | Disposition: A | Discharge status: Home / Self Care | Attending: Student | Admitting: Student

## 2023-09-20 DIAGNOSIS — Z79899 Other long term (current) drug therapy: Secondary | ICD-10-CM | POA: Diagnosis not present

## 2023-09-20 DIAGNOSIS — I509 Heart failure, unspecified: Secondary | ICD-10-CM | POA: Diagnosis not present

## 2023-09-20 DIAGNOSIS — Z951 Presence of aortocoronary bypass graft: Secondary | ICD-10-CM | POA: Diagnosis not present

## 2023-09-20 DIAGNOSIS — R7989 Other specified abnormal findings of blood chemistry: Secondary | ICD-10-CM | POA: Diagnosis present

## 2023-09-20 DIAGNOSIS — N186 End stage renal disease: Secondary | ICD-10-CM | POA: Diagnosis not present

## 2023-09-20 DIAGNOSIS — Z8673 Personal history of transient ischemic attack (TIA), and cerebral infarction without residual deficits: Secondary | ICD-10-CM | POA: Diagnosis not present

## 2023-09-20 DIAGNOSIS — D649 Anemia, unspecified: Secondary | ICD-10-CM | POA: Diagnosis not present

## 2023-09-20 DIAGNOSIS — I2510 Atherosclerotic heart disease of native coronary artery without angina pectoris: Secondary | ICD-10-CM | POA: Diagnosis not present

## 2023-09-20 DIAGNOSIS — Z992 Dependence on renal dialysis: Secondary | ICD-10-CM | POA: Insufficient documentation

## 2023-09-20 DIAGNOSIS — I132 Hypertensive heart and chronic kidney disease with heart failure and with stage 5 chronic kidney disease, or end stage renal disease: Secondary | ICD-10-CM | POA: Insufficient documentation

## 2023-09-20 LAB — COMPREHENSIVE METABOLIC PANEL
ALT: 13 U/L (ref 0–44)
AST: 24 U/L (ref 15–41)
Albumin: 3 g/dL — ABNORMAL LOW (ref 3.5–5.0)
Alkaline Phosphatase: 153 U/L — ABNORMAL HIGH (ref 38–126)
Anion gap: 13 (ref 5–15)
BUN: 33 mg/dL — ABNORMAL HIGH (ref 8–23)
CO2: 24 mmol/L (ref 22–32)
Calcium: 8.1 mg/dL — ABNORMAL LOW (ref 8.9–10.3)
Chloride: 100 mmol/L (ref 98–111)
Creatinine, Ser: 4.09 mg/dL — ABNORMAL HIGH (ref 0.44–1.00)
GFR, Estimated: 10 mL/min — ABNORMAL LOW (ref >60)
Glucose, Bld: 188 mg/dL — ABNORMAL HIGH (ref 70–99)
Potassium: 3.1 mmol/L — ABNORMAL LOW (ref 3.5–5.1)
Sodium: 137 mmol/L (ref 135–145)
Total Bilirubin: 0.8 mg/dL (ref 0.0–1.2)
Total Protein: 6.6 g/dL (ref 6.5–8.1)

## 2023-09-20 LAB — CBC
HCT: 22 % — ABNORMAL LOW (ref 36.0–46.0)
Hemoglobin: 7.1 g/dL — ABNORMAL LOW (ref 12.0–15.0)
MCH: 35.3 pg — ABNORMAL HIGH (ref 26.0–34.0)
MCHC: 32.3 g/dL (ref 30.0–36.0)
MCV: 109.5 fL — ABNORMAL HIGH (ref 80.0–100.0)
Platelets: 179 10*3/uL (ref 150–400)
RBC: 2.01 MIL/uL — ABNORMAL LOW (ref 3.87–5.11)
RDW: 26.2 % — ABNORMAL HIGH (ref 11.5–15.5)
WBC: 4.3 10*3/uL (ref 4.0–10.5)
nRBC: 0 % (ref 0.0–0.2)

## 2023-09-20 LAB — PREPARE RBC (CROSSMATCH)

## 2023-09-20 LAB — POC OCCULT BLOOD, ED: Fecal Occult Blood: NEGATIVE

## 2023-09-20 MED ORDER — SODIUM CHLORIDE 0.9% IV SOLUTION: Freq: Once | INTRAVENOUS | Status: AC

## 2023-09-20 MED ORDER — POTASSIUM CHLORIDE CRYS ER 20 MEQ PO TBCR
40.0000 meq | EXTENDED_RELEASE_TABLET | Freq: Once | ORAL | Status: AC
  Administered 2023-09-20: 40 meq via ORAL
  Filled 2023-09-20: qty 2

## 2023-09-20 NOTE — Discharge Instructions (Addendum)
 Your hemoglobin today was 7.1.  You have been transfused 1 unit of blood today here in the ER.  I recommend that you follow-up with your primary care provider later this week or early next week for recheck.  Also, please contact your dialysis center tomorrow to arrange for dialysis  We have given you a unit of blood but you will need to follow-up with your doctor to manage ongoing transfusions which can be done as an outpatient so that you do not have to come to the emergency department to have it done.  Talk to your doctor about that tomorrow.  Emergency department for severe worsening symptoms

## 2023-09-20 NOTE — ED Provider Notes (Signed)
 [place] EMERGENCY DEPARTMENT AT [facility] Provider Note   CSN: [id number] Arrival date & time: 09/20/23  1134     History  Chief Complaint  Patient presents with   Abnormal Lab    Shawna [name] is a 83 y.o. female.   Abnormal Lab      Shawna [name] is a 83 y.o. female with a past medical history of end-stage renal disease on hemodialysis Monday Wednesday Friday, hypertension, prior CABG, TIA, coronary artery disease, CHF, paroxysmal atrial fibrillation, anemia requiring multiple blood transfusions, who presents to the Emergency Department for evaluation of low hemoglobin.  Patient's spouse states that she has required multiple transfusions in the past.  She was dialyzed on Monday returned to the dialysis center today for dialysis and was noted to have low hemoglobin (6.9 per patient spouse) and sent here for further evaluation.  Patient states that she feels fatigued, weak all over.  Denies chest pain shortness of breath and abdominal pain.  No nausea vomiting, black or bloody stools.  Home Medications Prior to Admission medications   Medication Sig Start Date End Date Taking? Authorizing Provider  calcitRIOL (ROCALTROL) 0.25 MCG capsule Take 7 capsules (1.75 mcg total) by mouth every Monday, Wednesday, and Friday with hemodialysis. 12/21/22   Lanae Boast, MD  cinacalcet (SENSIPAR) 30 MG tablet Take 1 tablet (30 mg total) by mouth every Monday, Wednesday, and Friday with hemodialysis. 12/19/22   Lanae Boast, MD  cyanocobalamin (VITAMIN B12) 1000 MCG/ML injection Inject 1,000 mcg into the muscle every 30 (thirty) days.    [provider]  famotidine (PEPCID) 20 MG tablet Take 1 tablet (20 mg total) by mouth daily as needed for heartburn or indigestion. 09/10/23 09/09/24  Cleora Fleet, MD  folic acid (FOLVITE) 1 MG tablet Take 1 mg by mouth daily. 05/24/22   [provider]  hydrALAZINE (APRESOLINE) 25 MG tablet Take 1 tablet (25 mg total) by mouth 2 (two)  times daily. Hold for sbp <110 12/27/22   Lanae Boast, MD  hydrOXYzine (ATARAX) 25 MG tablet Take 25 mg by mouth every 6 (six) hours as needed for itching. 06/06/22   [provider]  levETIRAcetam (KEPPRA) 500 MG tablet Take 1 tablet (500 mg total) by mouth daily. 11/15/22   Leroy Sea, MD  levothyroxine (SYNTHROID) 50 MCG tablet Take 50 mcg by mouth daily. 08/27/23   [provider]  multivitamin (RENA-VIT) TABS tablet Take 1 tablet by mouth daily.    [provider]  nitroGLYCERIN (NITROSTAT) 0.4 MG SL tablet Place 0.4 mg under the tongue every 5 (five) minutes as needed for chest pain.    [provider]  octreotide (SANDOSTATIN LAR) 20 MG injection Inject 20 mg into the muscle every 28 (twenty-eight) days. 11/15/22   Leroy Sea, MD  ondansetron (ZOFRAN-ODT) 4 MG disintegrating tablet Take 4 mg by mouth every 8 (eight) hours as needed for nausea or vomiting. 05/22/23   [provider]  rosuvastatin (CRESTOR) 10 MG tablet Take 1 tablet by mouth once daily 06/06/22   Iran Ouch, [place] M, PA-C      Allergies    Amlodipine, Aspirin, Nitrofurantoin, Ranexa [ranolazine], Bactrim [sulfamethoxazole-trimethoprim], Iodinated contrast media, Iron, Tylenol [acetaminophen], Gabapentin, Sucroferric oxyhydroxide, Levaquin [levofloxacin in d5w], Levofloxacin, Plavix [clopidogrel bisulfate], Protonix [pantoprazole sodium], and Venofer [ferric oxide]    Review of Systems   Review of Systems  Constitutional:  Positive for fatigue. Negative for appetite change and fever.  Respiratory:  Negative for cough  and shortness of breath.   Cardiovascular:  Negative for chest pain.  Gastrointestinal:  Negative for abdominal pain, diarrhea, nausea and vomiting.  Genitourinary:  Negative for difficulty urinating.  Musculoskeletal:  Negative for myalgias.  Neurological:  Positive for weakness. Negative for dizziness and headaches.    Physical Exam Updated Vital  Signs BP (!) 151/68 (BP Location: Right Arm)   Pulse 79   Temp 98.6 F (37 C) (Oral)   Resp 16   Ht 5\' 2"  (1.575 m)   Wt 54.4 kg   SpO2 96%   BMI 21.95 kg/m  Physical Exam Vitals and nursing note reviewed. Exam conducted with a chaperone present.  Constitutional:      General: She is not in acute distress.    Appearance: Normal appearance. She is not ill-appearing or toxic-appearing.  HENT:     Mouth/Throat:     Mouth: Mucous membranes are moist.  Cardiovascular:     Rate and Rhythm: Normal rate and regular rhythm.     Pulses: Normal pulses.  Pulmonary:     Effort: Pulmonary effort is normal.     Breath sounds: Normal breath sounds.     Comments: Patient has dialysis catheter left chest Chest:     Chest wall: No tenderness.  Abdominal:     General: There is no distension.     Palpations: Abdomen is soft.     Tenderness: There is no abdominal tenderness.  Genitourinary:    Rectum: Guaiac result negative. No tenderness or external hemorrhoid. Normal anal tone.     Comments: Brown heme-negative stool on DRE, no palpable masses Musculoskeletal:        General: No swelling or tenderness.     Cervical back: Normal range of motion.  Skin:    General: Skin is warm.     Capillary Refill: Capillary refill takes less than 2 seconds.  Neurological:     General: No focal deficit present.     Mental Status: She is alert.     Sensory: No sensory deficit.     Motor: No weakness.     ED Results / Procedures / Treatments   Labs (all labs ordered are listed, but only abnormal results are displayed) Labs Reviewed  CBC - Abnormal; Notable for the following components:      Result Value   RBC 2.01 (*)    Hemoglobin 7.1 (*)    HCT 22.0 (*)    MCV 109.5 (*)    MCH 35.3 (*)    RDW 26.2 (*)    All other components within normal limits  COMPREHENSIVE METABOLIC PANEL - Abnormal; Notable for the following components:   Potassium 3.1 (*)    Glucose, Bld 188 (*)    BUN 33 (*)     Creatinine, Ser 4.09 (*)    Calcium 8.1 (*)    Albumin 3.0 (*)    Alkaline Phosphatase 153 (*)    GFR, Estimated 10 (*)    All other components within normal limits  POC OCCULT BLOOD, ED  TYPE AND SCREEN  PREPARE RBC (CROSSMATCH)    EKG EKG Interpretation Date/Time:  Wednesday September 20 2023 14:03:03 EDT Ventricular Rate:  78 PR Interval:  159 QRS Duration:  144 QT Interval:  458 QTC Calculation: 522 R Axis:   49  Text Interpretation: Sinus rhythm IVCD, consider atypical LBBB Confirmed by Eber Hong ([id number]) on 09/20/2023 4:00:47 PM  Radiology DG Chest Portable 1 View Result Date: 09/20/2023 CLINICAL DATA:  Weakness. EXAM: PORTABLE CHEST  1 VIEW COMPARISON:  Chest radiograph dated 09/16/2023. FINDINGS: Dialysis catheter in similar position. Shallow inspiration. Small bilateral pleural effusions and bibasilar atelectasis. Pneumonia is not excluded no pneumothorax. Stable cardiac silhouette. Median sternotomy wires and CABG vascular clips. No acute osseous pathology. IMPRESSION: Small bilateral pleural effusions and bibasilar atelectasis. Electronically Signed   By: Elgie Collard M.D.   On: 09/20/2023 18:24    Procedures Procedures     CRITICAL CARE Performed by: Britny Riel Total critical care time: 40 minutes Critical care time was exclusive of separately billable procedures and treating other patients. Critical care was necessary to treat or prevent imminent or life-threatening deterioration. Critical care was time spent personally by me on the following activities: development of treatment plan with patient and/or surrogate as well as nursing, discussions with consultants, evaluation of patient's response to treatment, examination of patient, obtaining history from patient or surrogate, ordering and performing treatments and interventions, ordering and review of laboratory studies, ordering and review of radiographic studies, pulse oximetry and re-evaluation of patient's  condition.  Medications Ordered in ED Medications - No data to display  ED Course/ Medical Decision Making/ A&P                                 Medical Decision Making Patient on hemodialysis Monday Wednesday Friday history of anemia with low hemoglobin.  Has history of same.  Was dialyzed on Monday went back for dialysis again today and was informed to come to the ER because her hemoglobin was low.  Believed to be 6.9.  Patient endorses generalized weakness and fatigue denies pain.  Hemoglobin noted to be 7.4 four days ago here  I suspect symptomatic anemia, GI bleed also considered but felt less likely.  No indication for abdominal imaging as patient has benign abdominal exam and denies having any abdominal pain.  Amount and/or Complexity of Data Reviewed Labs: ordered.    Details: No evidence of leukocytosis, hemoglobin today 7.1 here.  Serum creatinine 4.09, patient did not receive her dialysis today.  Potassium 3.1  POC Hemoccult negative for presence of blood Radiology: ordered.    Details: Chest x-ray shows small bilateral pleural effusions and bibasilar atelectasis ECG/medicine tests: ordered.    Details: EKG shows sinus rhythm IVCD Discussion of management or test interpretation with external provider(s):  Likely symptomatic anemia.  No indication of acute GI bleed.  Discussed findings with Dr. Audrie Lia.  Will transfuse 1 unit of blood.  Patient has ate and drink fluids without difficulty.  Potassium this evening was 3.1 and she has been given oral potassium here.  Her serum creatinine is 4.09 this evening.  Discussed need for follow-up with dialysis center for tomorrow.   She will likely be discharged home after transfusion.  I was informed by nursing staff that they were having difficulty of obtaining IV access.  Dr. Hyacinth Meeker agreeable to obtain IV  Risk Prescription drug management.           Final Clinical Impression(s) / ED Diagnoses Final diagnoses:   Symptomatic anemia    Rx / DC Orders ED Discharge Orders     None         Rosey Bath 09/20/23 1928    Glendora Score, MD 09/20/23 2026

## 2023-09-20 NOTE — ED Notes (Signed)
RN attempted IV insertion x2.  

## 2023-09-20 NOTE — ED Notes (Signed)
 Pt is a hard stick

## 2023-09-20 NOTE — ED Triage Notes (Signed)
 Pt was sent here from dialysis for a low hemoglobin. Spouse reports pt needs frequent blood transfusions and she has a rare blood type. Pt is bent over and fatigued in triage. Pt was unable to receive dialysis today

## 2023-09-20 NOTE — ED Provider Notes (Signed)
 At change of shift care signed out pending transfusion of 1 unit of blood.  I had to place an IV in the patient's right external jugular vein as she did not have any access and nurses could not find access.  This was done without difficulty with a 22 gauge IV.  The patient has received 1 unit of blood, feeling stable, vitals unremarkable, stable for discharge   Eber Hong, MD 09/20/23 2241

## 2023-09-24 LAB — TYPE AND SCREEN
ABO/RH(D): O POS
Antibody Screen: NEGATIVE
Donor AG Type: NEGATIVE
Donor AG Type: NEGATIVE
Unit division: 0
Unit division: 0

## 2023-09-24 LAB — BPAM RBC
Blood Product Expiration Date: 202504182359
Blood Product Expiration Date: 202504222359
ISSUE DATE / TIME: 202503121932
Unit Type and Rh: 5100
Unit Type and Rh: 5100

## 2023-10-03 ENCOUNTER — Other Ambulatory Visit: Payer: Self-pay

## 2023-10-03 ENCOUNTER — Inpatient Hospital Stay (HOSPITAL_COMMUNITY)
Admission: EM | Admit: 2023-10-03 | Discharge: 2023-10-08 | DRG: 377 | Disposition: A | Discharge status: Home / Self Care | Attending: Family Medicine | Admitting: Family Medicine

## 2023-10-03 ENCOUNTER — Encounter (HOSPITAL_COMMUNITY): Payer: Self-pay

## 2023-10-03 DIAGNOSIS — I2510 Atherosclerotic heart disease of native coronary artery without angina pectoris: Secondary | ICD-10-CM | POA: Diagnosis present

## 2023-10-03 DIAGNOSIS — D7589 Other specified diseases of blood and blood-forming organs: Secondary | ICD-10-CM | POA: Diagnosis present

## 2023-10-03 DIAGNOSIS — Z8711 Personal history of peptic ulcer disease: Secondary | ICD-10-CM

## 2023-10-03 DIAGNOSIS — K625 Hemorrhage of anus and rectum: Secondary | ICD-10-CM | POA: Diagnosis not present

## 2023-10-03 DIAGNOSIS — D649 Anemia, unspecified: Secondary | ICD-10-CM | POA: Diagnosis present

## 2023-10-03 DIAGNOSIS — E440 Moderate protein-calorie malnutrition: Secondary | ICD-10-CM | POA: Diagnosis present

## 2023-10-03 DIAGNOSIS — G9341 Metabolic encephalopathy: Secondary | ICD-10-CM | POA: Diagnosis present

## 2023-10-03 DIAGNOSIS — I447 Left bundle-branch block, unspecified: Secondary | ICD-10-CM | POA: Diagnosis present

## 2023-10-03 DIAGNOSIS — Z886 Allergy status to analgesic agent status: Secondary | ICD-10-CM

## 2023-10-03 DIAGNOSIS — Z8543 Personal history of malignant neoplasm of ovary: Secondary | ICD-10-CM

## 2023-10-03 DIAGNOSIS — I10 Essential (primary) hypertension: Secondary | ICD-10-CM | POA: Diagnosis present

## 2023-10-03 DIAGNOSIS — K922 Gastrointestinal hemorrhage, unspecified: Secondary | ICD-10-CM | POA: Diagnosis present

## 2023-10-03 DIAGNOSIS — D539 Nutritional anemia, unspecified: Secondary | ICD-10-CM | POA: Diagnosis present

## 2023-10-03 DIAGNOSIS — I959 Hypotension, unspecified: Secondary | ICD-10-CM | POA: Diagnosis present

## 2023-10-03 DIAGNOSIS — E876 Hypokalemia: Secondary | ICD-10-CM | POA: Diagnosis not present

## 2023-10-03 DIAGNOSIS — Z6823 Body mass index (BMI) 23.0-23.9, adult: Secondary | ICD-10-CM

## 2023-10-03 DIAGNOSIS — Z8249 Family history of ischemic heart disease and other diseases of the circulatory system: Secondary | ICD-10-CM

## 2023-10-03 DIAGNOSIS — N186 End stage renal disease: Secondary | ICD-10-CM | POA: Diagnosis present

## 2023-10-03 DIAGNOSIS — E46 Unspecified protein-calorie malnutrition: Secondary | ICD-10-CM | POA: Diagnosis present

## 2023-10-03 DIAGNOSIS — N2581 Secondary hyperparathyroidism of renal origin: Secondary | ICD-10-CM | POA: Diagnosis present

## 2023-10-03 DIAGNOSIS — Z881 Allergy status to other antibiotic agents status: Secondary | ICD-10-CM

## 2023-10-03 DIAGNOSIS — Z800 Family history of malignant neoplasm of digestive organs: Secondary | ICD-10-CM

## 2023-10-03 DIAGNOSIS — I5043 Acute on chronic combined systolic (congestive) and diastolic (congestive) heart failure: Secondary | ICD-10-CM | POA: Diagnosis present

## 2023-10-03 DIAGNOSIS — D62 Acute posthemorrhagic anemia: Secondary | ICD-10-CM | POA: Diagnosis present

## 2023-10-03 DIAGNOSIS — Z91041 Radiographic dye allergy status: Secondary | ICD-10-CM

## 2023-10-03 DIAGNOSIS — F0390 Unspecified dementia without behavioral disturbance: Secondary | ICD-10-CM | POA: Diagnosis present

## 2023-10-03 DIAGNOSIS — Z951 Presence of aortocoronary bypass graft: Secondary | ICD-10-CM

## 2023-10-03 DIAGNOSIS — D631 Anemia in chronic kidney disease: Secondary | ICD-10-CM | POA: Diagnosis present

## 2023-10-03 DIAGNOSIS — E8809 Other disorders of plasma-protein metabolism, not elsewhere classified: Secondary | ICD-10-CM | POA: Diagnosis not present

## 2023-10-03 DIAGNOSIS — K219 Gastro-esophageal reflux disease without esophagitis: Secondary | ICD-10-CM | POA: Diagnosis present

## 2023-10-03 DIAGNOSIS — Z992 Dependence on renal dialysis: Secondary | ICD-10-CM

## 2023-10-03 DIAGNOSIS — I4891 Unspecified atrial fibrillation: Secondary | ICD-10-CM | POA: Diagnosis present

## 2023-10-03 DIAGNOSIS — K5521 Angiodysplasia of colon with hemorrhage: Principal | ICD-10-CM | POA: Diagnosis present

## 2023-10-03 DIAGNOSIS — I5042 Chronic combined systolic (congestive) and diastolic (congestive) heart failure: Secondary | ICD-10-CM | POA: Diagnosis present

## 2023-10-03 DIAGNOSIS — Z888 Allergy status to other drugs, medicaments and biological substances status: Secondary | ICD-10-CM

## 2023-10-03 DIAGNOSIS — E039 Hypothyroidism, unspecified: Secondary | ICD-10-CM | POA: Diagnosis present

## 2023-10-03 DIAGNOSIS — Z85038 Personal history of other malignant neoplasm of large intestine: Secondary | ICD-10-CM

## 2023-10-03 DIAGNOSIS — I132 Hypertensive heart and chronic kidney disease with heart failure and with stage 5 chronic kidney disease, or end stage renal disease: Secondary | ICD-10-CM | POA: Diagnosis present

## 2023-10-03 DIAGNOSIS — Z90710 Acquired absence of both cervix and uterus: Secondary | ICD-10-CM

## 2023-10-03 DIAGNOSIS — Z79899 Other long term (current) drug therapy: Secondary | ICD-10-CM

## 2023-10-03 DIAGNOSIS — Z79890 Hormone replacement therapy (postmenopausal): Secondary | ICD-10-CM

## 2023-10-03 DIAGNOSIS — E782 Mixed hyperlipidemia: Secondary | ICD-10-CM | POA: Diagnosis present

## 2023-10-03 DIAGNOSIS — Z833 Family history of diabetes mellitus: Secondary | ICD-10-CM

## 2023-10-03 DIAGNOSIS — I480 Paroxysmal atrial fibrillation: Secondary | ICD-10-CM | POA: Diagnosis present

## 2023-10-03 DIAGNOSIS — E1122 Type 2 diabetes mellitus with diabetic chronic kidney disease: Secondary | ICD-10-CM | POA: Diagnosis present

## 2023-10-03 DIAGNOSIS — Z8673 Personal history of transient ischemic attack (TIA), and cerebral infarction without residual deficits: Secondary | ICD-10-CM

## 2023-10-03 DIAGNOSIS — K31811 Angiodysplasia of stomach and duodenum with bleeding: Secondary | ICD-10-CM | POA: Diagnosis present

## 2023-10-03 DIAGNOSIS — Z83438 Family history of other disorder of lipoprotein metabolism and other lipidemia: Secondary | ICD-10-CM

## 2023-10-03 DIAGNOSIS — Z955 Presence of coronary angioplasty implant and graft: Secondary | ICD-10-CM

## 2023-10-03 LAB — COMPREHENSIVE METABOLIC PANEL
ALT: 20 U/L (ref 0–44)
AST: 32 U/L (ref 15–41)
Albumin: 3 g/dL — ABNORMAL LOW (ref 3.5–5.0)
Alkaline Phosphatase: 182 U/L — ABNORMAL HIGH (ref 38–126)
Anion gap: 9 (ref 5–15)
BUN: 21 mg/dL (ref 8–23)
CO2: 29 mmol/L (ref 22–32)
Calcium: 8.5 mg/dL — ABNORMAL LOW (ref 8.9–10.3)
Chloride: 102 mmol/L (ref 98–111)
Creatinine, Ser: 3.95 mg/dL — ABNORMAL HIGH (ref 0.44–1.00)
GFR, Estimated: 11 mL/min — ABNORMAL LOW (ref >60)
Glucose, Bld: 154 mg/dL — ABNORMAL HIGH (ref 70–99)
Potassium: 3.2 mmol/L — ABNORMAL LOW (ref 3.5–5.1)
Sodium: 140 mmol/L (ref 135–145)
Total Bilirubin: 0.9 mg/dL (ref 0.0–1.2)
Total Protein: 6.9 g/dL (ref 6.5–8.1)

## 2023-10-03 LAB — CBC
HCT: 26.4 % — ABNORMAL LOW (ref 36.0–46.0)
Hemoglobin: 8.3 g/dL — ABNORMAL LOW (ref 12.0–15.0)
MCH: 33.7 pg (ref 26.0–34.0)
MCHC: 31.4 g/dL (ref 30.0–36.0)
MCV: 107.3 fL — ABNORMAL HIGH (ref 80.0–100.0)
Platelets: 168 10*3/uL (ref 150–400)
RBC: 2.46 MIL/uL — ABNORMAL LOW (ref 3.87–5.11)
RDW: 29.1 % — ABNORMAL HIGH (ref 11.5–15.5)
WBC: 4.3 10*3/uL (ref 4.0–10.5)
nRBC: 0 % (ref 0.0–0.2)

## 2023-10-03 MED ORDER — POTASSIUM CHLORIDE CRYS ER 20 MEQ PO TBCR
20.0000 meq | EXTENDED_RELEASE_TABLET | Freq: Once | ORAL | Status: AC
  Administered 2023-10-04: 20 meq via ORAL
  Filled 2023-10-03: qty 1

## 2023-10-03 NOTE — ED Triage Notes (Signed)
 Pt presents with bright red rectal bleeding that started today. This is a recurrent problem for pt. Hx of blood transfusions and was admitted for this earlier this month. Pt is dialysis pt and last session was yesterday.

## 2023-10-03 NOTE — H&P (Signed)
 History and Physical    Patient: Shawna [name] ZOX:[id number] DOB: [birth year] DOA: 10/03/2023 DOS: the patient was seen and examined on 10/04/2023 PCP: [facility]  Patient coming from: Home  Chief Complaint:  Chief Complaint  Patient presents with   Rectal Bleeding   HPI: CLORINE SWING is a 83 y.o. female with medical history significant of hemodialysis (MWF), CAD status post CABG, HFrEF, T2DM, hypertension, GERD, atrial fibrillation (not on anticoagulants) who presents to the emergency department due to bright red rectal bleeding which started today.  She has a history of recurrent GI bleed.  Last dialysis session was yesterday.  ED Course:  In the emergency department, she was hemodynamically stable.  Workup in the ED showed ma, BMP was normal except for potassium of 3.2, blood glucose 154, creatinine 3.95, albumin 3.0 crocytic anemia Gastroenterologist (Dr. Levon Hedger) was consulted and will follow-up with patient in the morning per EDP.  Review of Systems: Review of systems as noted in the HPI. All other systems reviewed and are negative.   Past Medical History:  Diagnosis Date   Acute on chronic respiratory failure with hypoxia (HCC) 10/10/2016   Anxiety    Arthritis    AVM (arteriovenous malformation) of colon    CAD (coronary artery disease)    a. s/p CABG in 2013 b. DES to D1 in 10/2016. c. cath in 07/2018 showing patent grafts with occlusion of D1 at prior stent site and progression of PDA disease --> medical management recommended   Carotid artery disease (HCC)    a. 60-79% LICA, 03/2012    Chronic bronchitis (HCC)    Chronic HFrEF (heart failure with reduced ejection fraction) (HCC)    Colon cancer (HCC) 1992   Esophageal stricture    ESRD on hemodialysis (HCC)    ESRD due to HTN, started dialysis 2011 and gets HD at [facility] with Dr Fausto Skillern on MWF schedule.  Access is LUA AVF as of Sept 2014.    GERD (gastroesophageal reflux disease)    High  cholesterol 12/2011   History of blood transfusion 07/2011; 12/2011; 01/2012 X 2; 04/2012   History of gout    History of lower GI bleeding    Hypertension    Iron deficiency anemia    Jugular vein occlusion, right (HCC)    Mitral regurgitation    a. Moderate by echo, 02/2012   Mitral valve disease    NSVT (nonsustained ventricular tachycardia) (HCC)    Ovarian cancer (HCC) 1992   PAF (paroxysmal atrial fibrillation) (HCC)    Pneumonia ~ 2009   PUD (peptic ulcer disease)    TIA (transient ischemic attack)    Tricuspid valve disease    Past Surgical History:  Procedure Laterality Date   A/V FISTULAGRAM Left 10/04/2022   Procedure: A/V Fistulagram;  Surgeon: Renford Dills, MD;  Location: [facility] INVASIVE CV LAB;  Service: Cardiovascular;  Laterality: Left;   A/V SHUNTOGRAM Left 03/19/2019   Procedure: A/V SHUNTOGRAM;  Surgeon: Renford Dills, MD;  Location: [facility] INVASIVE CV LAB;  Service: Cardiovascular;  Laterality: Left;   ABDOMINAL HYSTERECTOMY  1992   APPENDECTOMY  06/1990   AV FISTULA PLACEMENT  07/2009   left upper arm   AV FISTULA PLACEMENT Right 09/06/2016   Procedure: RIGHT FOREARM ARTERIOVENOUS (AV) GRAFT;  Surgeon: Sherren Kerns, MD;  [facility]: [facility] OR;  Service: Vascular;  Laterality: Right;   AV FISTULA PLACEMENT N/A 02/24/2017   Procedure: INSERTION OF ARTERIOVENOUS (AV) GORE-TEX GRAFT ARM (BRACHIAL AXILLARY);  Surgeon: Renford Dills, MD;  Location: [facility] ORS;  Service: Vascular;  Laterality: N/A;   AVGG REMOVAL Right 09/06/2016   Procedure: REMOVAL OF Right Arm ARTERIOVENOUS GORETEX GRAFT and Vein Patch angioplasty of brachial artery;  Surgeon: Chuck Hint, MD;  Location: [facility] OR;  Service: Vascular;  Laterality: Right;   BIOPSY  09/26/2019   Procedure: BIOPSY;  Surgeon: Malissa Hippo, MD;  [facility];  Service: Endoscopy;;   BIOPSY  11/01/2022   Procedure: BIOPSY;  Surgeon: Dolores Frame, MD;  [facility];  Service:  Gastroenterology;;   COLON RESECTION  1992   COLON SURGERY     COLONOSCOPY N/A 03/09/2019   Procedure: COLONOSCOPY;  Surgeon: Malissa Hippo, MD;  [facility];  Service: Endoscopy;  Laterality: N/A;   COLONOSCOPY N/A 08/11/2022   Procedure: COLONOSCOPY;  Surgeon: Beverley Fiedler, MD;  Location: [facility] ENDOSCOPY;  Service: Gastroenterology;  Laterality: N/A;   COLONOSCOPY WITH PROPOFOL N/A 06/08/2021   Procedure: COLONOSCOPY WITH PROPOFOL;  Surgeon: Dolores Frame, MD;  [facility];  Service: Gastroenterology;  Laterality: N/A;  9:05 /Patient is on dialysis Mon Wed Fri   CORONARY ANGIOPLASTY WITH STENT PLACEMENT  12/15/11   "2"   CORONARY ANGIOPLASTY WITH STENT PLACEMENT  y/2013   "1; makes total of 3" (05/02/2012)   CORONARY ARTERY BYPASS GRAFT  06/13/2012   Procedure: CORONARY ARTERY BYPASS GRAFTING (CABG);  Surgeon: Delight Ovens, MD;  Location: [facility] OR;  Service: Open Heart Surgery;  Laterality: N/A;  cabg x four;  using left internal mammary artery, and left leg greater saphenous vein harvested endoscopically   CORONARY STENT INTERVENTION N/A 10/13/2016   Procedure: Coronary Stent Intervention;  Surgeon: Lennette Bihari, MD;  [facility];  Service: Cardiovascular;  Laterality: N/A;   DIALYSIS/PERMA CATHETER REMOVAL N/A 04/18/2017   Procedure: DIALYSIS/PERMA CATHETER REMOVAL;  Surgeon: Renford Dills, MD;  Location: [facility] INVASIVE CV LAB;  Service: Cardiovascular;  Laterality: N/A;   DILATION AND CURETTAGE OF UTERUS     ENTEROSCOPY N/A 06/08/2021   Procedure: PUSH ENTEROSCOPY;  Surgeon: Dolores Frame, MD;  [facility];  Service: Gastroenterology;  Laterality: N/A;   ENTEROSCOPY N/A 11/01/2022   Procedure: ENTEROSCOPY;  Surgeon: Dolores Frame, MD;  [facility];  Service: Gastroenterology;  Laterality: N/A;   ENTEROSCOPY N/A 12/01/2022   Procedure: ENTEROSCOPY;  Surgeon: Benancio Deeds, MD;   Location: [facility] ENDOSCOPY;  Service: Gastroenterology;  Laterality: N/A;   ESOPHAGOGASTRODUODENOSCOPY  01/20/2012   Procedure: ESOPHAGOGASTRODUODENOSCOPY (EGD);  Surgeon: Meryl Dare, MD,FACG;  Location: [name] ENDOSCOPY;  Service: Endoscopy;  Laterality: N/A;   ESOPHAGOGASTRODUODENOSCOPY N/A 03/26/2013   Procedure: ESOPHAGOGASTRODUODENOSCOPY (EGD);  Surgeon: Hilarie Fredrickson, MD;  Location: [facility] ENDOSCOPY;  Service: Endoscopy;  Laterality: N/A;   ESOPHAGOGASTRODUODENOSCOPY N/A 04/30/2015   Procedure: ESOPHAGOGASTRODUODENOSCOPY (EGD);  Surgeon: Malissa Hippo, MD;  [facility];  Service: Endoscopy;  Laterality: N/A;  1pm - moved to 10/20 @ 1:10   ESOPHAGOGASTRODUODENOSCOPY N/A 07/29/2016   Procedure: ESOPHAGOGASTRODUODENOSCOPY (EGD);  Surgeon: Ruffin Frederick, MD;  Location: [facility] ENDOSCOPY;  Service: Gastroenterology;  Laterality: N/A;  enteroscopy   ESOPHAGOGASTRODUODENOSCOPY N/A 09/26/2019   Procedure: ESOPHAGOGASTRODUODENOSCOPY (EGD);  Surgeon: Malissa Hippo, MD;  [facility];  Service: Endoscopy;  Laterality: N/A;  1250   ESOPHAGOGASTRODUODENOSCOPY N/A 08/11/2022   Procedure: ESOPHAGOGASTRODUODENOSCOPY (EGD);  Surgeon: Beverley Fiedler, MD;  Location: [facility] ENDOSCOPY;  Service:  Gastroenterology;  Laterality: N/A;   ESOPHAGOGASTRODUODENOSCOPY (EGD) WITH PROPOFOL N/A 02/05/2021   Procedure: ESOPHAGOGASTRODUODENOSCOPY (EGD) WITH PROPOFOL;  Surgeon: Lanelle Bal, DO;  [facility];  Service: Endoscopy;  Laterality: N/A;   ESOPHAGOGASTRODUODENOSCOPY (EGD) WITH PROPOFOL N/A 08/27/2022   Procedure: ESOPHAGOGASTRODUODENOSCOPY (EGD) WITH PROPOFOL;  Surgeon: Lanelle Bal, DO;  [facility];  Service: Endoscopy;  Laterality: N/A;   GIVENS CAPSULE STUDY N/A 03/07/2019   Procedure: GIVENS CAPSULE STUDY;  Surgeon: Malissa Hippo, MD;  [facility];  Service: Endoscopy;  Laterality: N/A;  7:30   GIVENS CAPSULE STUDY N/A 04/22/2021   Procedure: GIVENS  CAPSULE STUDY;  Surgeon: Malissa Hippo, MD;  [facility];  Service: Endoscopy;  Laterality: N/A;  7:30   GIVENS CAPSULE STUDY N/A 08/27/2022   Procedure: GIVENS CAPSULE STUDY;  Surgeon: Lanelle Bal, DO;  [facility];  Service: Endoscopy;  Laterality: N/A;   HOT HEMOSTASIS N/A 08/11/2022   Procedure: HOT HEMOSTASIS (ARGON PLASMA COAGULATION/BICAP);  Surgeon: Beverley Fiedler, MD;  Location: [facility] ENDOSCOPY;  Service: Gastroenterology;  Laterality: N/A;   HOT HEMOSTASIS N/A 12/01/2022   Procedure: HOT HEMOSTASIS (ARGON PLASMA COAGULATION/BICAP);  Surgeon: Benancio Deeds, MD;  Location: [facility] ENDOSCOPY;  Service: Gastroenterology;  Laterality: N/A;   INSERTION OF DIALYSIS CATHETER N/A 10/05/2020   Procedure: ABORTED TUNNELED DIALYSIS CATHETER PLACEMENT RIGHT INTERNAL JUGULAR VEIN ;  Surgeon: Lucretia Roers, MD;  Location: [facility];  Service: General;  Laterality: N/A;   INTRAOPERATIVE TRANSESOPHAGEAL ECHOCARDIOGRAM  06/13/2012   Procedure: INTRAOPERATIVE TRANSESOPHAGEAL ECHOCARDIOGRAM;  Surgeon: Delight Ovens, MD;  Location: [facility] OR;  Service: Open Heart Surgery;  Laterality: N/A;   IR DIALY SHUNT INTRO NEEDLE/INTRACATH INITIAL W/IMG LEFT Left 10/06/2020   IR FLUORO GUIDE CV LINE RIGHT  06/17/2020   IR FLUORO GUIDE CV LINE RIGHT  11/12/2022   IR GENERIC HISTORICAL  07/26/2016   IR FLUORO GUIDE CV LINE RIGHT 07/26/2016 Berdine Dance, MD [facility]   IR GENERIC HISTORICAL  07/26/2016   IR US GUIDE VASC ACCESS RIGHT 07/26/2016 Berdine Dance, MD [facility]   IR GENERIC HISTORICAL  08/02/2016   IR US GUIDE VASC ACCESS RIGHT 08/02/2016 Berdine Dance, MD [facility]   IR GENERIC HISTORICAL  08/02/2016   IR FLUORO GUIDE CV LINE RIGHT 08/02/2016 Berdine Dance, MD [facility]   IR RADIOLOGY PERIPHERAL GUIDED IV START  03/28/2017   IR REMOVAL TUN CV CATH W/O FL  08/11/2020   IR REMOVAL TUN CV CATH W/O FL  11/15/2022   IR THROMBECTOMY AV FISTULA W/THROMBOLYSIS INC/SHUNT/IMG LEFT Left  06/17/2020   IR US GUIDE VASC ACCESS LEFT  06/17/2020   IR US GUIDE VASC ACCESS RIGHT  03/28/2017   IR US GUIDE VASC ACCESS RIGHT  06/17/2020   IR US GUIDE VASC ACCESS RIGHT  11/12/2022   LEFT HEART CATH AND CORONARY ANGIOGRAPHY N/A 09/20/2016   Procedure: Left Heart Cath and Coronary Angiography;  Surgeon: Lyn Records, MD;  Location: [facility] INVASIVE CV LAB;  Service: Cardiovascular;  Laterality: N/A;   LEFT HEART CATH AND CORS/GRAFTS ANGIOGRAPHY N/A 10/13/2016   Procedure: Left Heart Cath and Cors/Grafts Angiography;  Surgeon: Lennette Bihari, MD;  [facility]: [facility];  Service: Cardiovascular;  Laterality: N/A;   LEFT HEART CATH AND CORS/GRAFTS ANGIOGRAPHY N/A 07/13/2018   Procedure: LEFT HEART CATH AND CORS/GRAFTS ANGIOGRAPHY;  Surgeon: Swaziland, Peter M, MD;  Location: [facility] INVASIVE CV LAB;  Service: Cardiovascular;  Laterality: N/A;   LEFT HEART CATH AND CORS/GRAFTS ANGIOGRAPHY N/A 07/22/2021   Procedure: LEFT HEART CATH AND CORS/GRAFTS ANGIOGRAPHY;  Surgeon: Runell Gess, MD;  Location: [facility] INVAS[facility] CV LAB;  Service: Cardiovascular;  Laterality: N/A;   LEFT HEART CATHETERIZATION WITH CORONARY ANGIOGRAM N/A 12/15/2011   Procedure: LEFT HEART CATHETERIZATION WITH CORONARY ANGIOGRAM;  Surgeon: Kathleene Hazel, MD;  Location: [facility] CATH LAB;  Service: Cardiovascular;  Laterality: N/A;   LEFT HEART CATHETERIZATION WITH CORONARY ANGIOGRAM N/A 01/10/2012   Procedure: LEFT HEART CATHETERIZATION WITH CORONARY ANGIOGRAM;  Surgeon: Peter M Swaziland, MD;  Location: [facility] CATH LAB;  Service: Cardiovascular;  Laterality: N/A;   LEFT HEART CATHETERIZATION WITH CORONARY ANGIOGRAM N/A 06/08/2012   Procedure: LEFT HEART CATHETERIZATION WITH CORONARY ANGIOGRAM;  Surgeon: Kathleene Hazel, MD;  Location: [facility] CATH LAB;  Service: Cardiovascular;  Laterality: N/A;   LEFT HEART CATHETERIZATION WITH CORONARY/GRAFT ANGIOGRAM N/A 12/10/2013   Procedure: LEFT HEART CATHETERIZATION WITH Isabel Caprice;  Surgeon:  Corky Crafts, MD;  Location: [facility] CATH LAB;  Service: Cardiovascular;  Laterality: N/A;   OVARY SURGERY     ovarian cancer   POLYPECTOMY  03/09/2019   Procedure: POLYPECTOMY;  Surgeon: Malissa Hippo, MD;  [facility];  Service: Endoscopy;;  cecal    POLYPECTOMY N/A 09/26/2019   Procedure: DUODENAL POLYPECTOMY;  Surgeon: Malissa Hippo, MD;  [facility];  Service: Endoscopy;  Laterality: N/A;   POLYPECTOMY  08/11/2022   Procedure: POLYPECTOMY;  Surgeon: Beverley Fiedler, MD;  Location: [facility] ENDOSCOPY;  Service: Gastroenterology;;   REVISION OF ARTERIOVENOUS GORETEX GRAFT N/A 02/24/2017   Procedure: REVISION OF ARTERIOVENOUS GORETEX GRAFT (RESECTION);  Surgeon: Renford Dills, MD;  Location: [facility] ORS;  Service: Vascular;  Laterality: N/A;   REVISON OF ARTERIOVENOUS FISTULA Left 06/19/2020   Procedure: REVISION OF LEFT UPPER ARM AV GRAFT WITH INTERPOSITION JUMP GRAFT USING GORE LIMB;  Surgeon: Cephus Shelling, MD;  Location: [facility] OR;  Service: Vascular;  Laterality: Left;   SHUNTOGRAM N/A 10/15/2013   Procedure: Fistulogram;  Surgeon: Nada Libman, MD;  Location: [facility] CATH LAB;  Service: Cardiovascular;  Laterality: N/A;   SUBMUCOSAL TATTOO INJECTION  11/01/2022   Procedure: SUBMUCOSAL TATTOO INJECTION;  Surgeon: Dolores Frame, MD;  [facility];  Service: Gastroenterology;;   THROMBECTOMY / ARTERIOVENOUS GRAFT REVISION  2011   left upper arm   TUBAL LIGATION  1980's   UPPER EXTREMITY ANGIOGRAPHY Bilateral 12/06/2016   Procedure: Upper Extremity Angiography;  Surgeon: Renford Dills, MD;  Location: [facility] INVASIVE CV LAB;  Service: Cardiovascular;  Laterality: Bilateral;   UPPER EXTREMITY INTERVENTION Left 06/06/2017   Procedure: UPPER EXTREMITY INTERVENTION;  Surgeon: Renford Dills, MD;  Location: [facility] INVASIVE CV LAB;  Service: Cardiovascular;  Laterality: Left;   UPPER EXTREMITY VENOGRAPHY Left 10/04/2022   Procedure: UPPER  EXTREMITY VENOGRAPHY;  Surgeon: Renford Dills, MD;  Location: [facility] INVASIVE CV LAB;  Service: Cardiovascular;  Laterality: Left;    Social History:  reports that she has never smoked. She has never used smokeless tobacco. She reports that she does not drink alcohol and does not use drugs.   Allergies  Allergen Reactions   Amlodipine Swelling   Aspirin Other (See Comments)    High Doses Mess up her stomach; "makes my bowels have blood in them". Takes 81 mg EC Aspirin    Nitrofurantoin Hives   Ranexa [Ranolazine] Other (See Comments)    Myoclonus-hospitalized    Bactrim [  Sulfamethoxazole-Trimethoprim] Rash   Iodinated Contrast Media Itching and Other (See Comments)   Iron Itching and Other (See Comments)    "they gave me iron in dialysis; had to give me Benadryl cause I had to have the iron" (05/02/2012)   Tylenol [Acetaminophen] Itching and Other (See Comments)    Makes her feet on fire per pt - can tolerate per pt   Gabapentin Other (See Comments)    Unknown reaction   Sucroferric Oxyhydroxide Other (See Comments)    Unknown   Levaquin [Levofloxacin In D5w] Rash   Levofloxacin Rash and Other (See Comments)   Plavix [Clopidogrel Bisulfate] Rash   Protonix [Pantoprazole Sodium] Rash   Venofer [Ferric Oxide] Itching and Other (See Comments)    Patient reports using Benadryl prior to doses as [facility]    Family History  Problem Relation Age of Onset   Heart disease Mother        Heart Disease before age 64   Hyperlipidemia Mother    Hypertension Mother    Diabetes Mother    Heart attack Mother    Heart disease Father        Heart Disease before age 40   Hyperlipidemia Father    Hypertension Father    Diabetes Father    Diabetes Sister    Hypertension Sister    Diabetes Brother    Hyperlipidemia Brother    Heart attack Brother    Hypertension Sister    Heart attack Brother    Colon cancer Child 105   Other Other        noncontributory for early CAD    Esophageal cancer Neg Hx    Liver disease Neg Hx    Kidney disease Neg Hx    Colon polyps Neg Hx       Prior to Admission medications   Medication Sig Start Date End Date Taking? Authorizing Provider  fluticasone (FLONASE) 50 MCG/ACT nasal spray Place 2 sprays into both nostrils daily. 09/19/23  Yes [provider]  calcitRIOL (ROCALTROL) 0.25 MCG capsule Take 7 capsules (1.75 mcg total) by mouth every Monday, Wednesday, and Friday with hemodialysis. 12/21/22   Lanae Boast, MD  cinacalcet (SENSIPAR) 30 MG tablet Take 1 tablet (30 mg total) by mouth every Monday, Wednesday, and Friday with hemodialysis. 12/19/22   Lanae Boast, MD  cyanocobalamin (VITAMIN B12) 1000 MCG/ML injection Inject 1,000 mcg into the muscle every 30 (thirty) days.    [provider]  famotidine (PEPCID) 20 MG tablet Take 1 tablet (20 mg total) by mouth daily as needed for heartburn or indigestion. 09/10/23 09/09/24  Cleora Fleet, MD  folic acid (FOLVITE) 1 MG tablet Take 1 mg by mouth daily. 05/24/22   [provider]  hydrALAZINE (APRESOLINE) 25 MG tablet Take 1 tablet (25 mg total) by mouth 2 (two) times daily. Hold for sbp <110 12/27/22   Lanae Boast, MD  hydrOXYzine (ATARAX) 25 MG tablet Take 25 mg by mouth every 6 (six) hours as needed for itching. 06/06/22   [provider]  levETIRAcetam (KEPPRA) 500 MG tablet Take 1 tablet (500 mg total) by mouth daily. 11/15/22   Leroy Sea, MD  levothyroxine (SYNTHROID) 50 MCG tablet Take 50 mcg by mouth daily. 08/27/23   [provider]  multivitamin (RENA-VIT) TABS tablet Take 1 tablet by mouth daily.    [provider]  nitroGLYCERIN (NITROSTAT) 0.4 MG SL tablet Place 0.4 mg under the tongue every 5 (five) minutes as needed  for chest pain.    [provider]  octreotide (SANDOSTATIN LAR) 20 MG injection Inject 20 mg into the muscle every 28 (twenty-eight) days. 11/15/22   Leroy Sea, MD  ondansetron  (ZOFRAN-ODT) 4 MG disintegrating tablet Take 4 mg by mouth every 8 (eight) hours as needed for nausea or vomiting. 05/22/23   [provider]  rosuvastatin (CRESTOR) 10 MG tablet Take 1 tablet by mouth once daily 06/06/22   Iran Ouch, Lennart Pall, PA-C    Physical Exam: BP (!) 148/67   Pulse 73   Temp 97.8 F (36.6 C) (Oral)   Resp 19   SpO2 100%   General: 83 y.o. year-old female well developed well nourished in no acute distress.  Alert and oriented x3. HEENT: NCAT, EOMI Neck: Supple, trachea medial Cardiovascular: Regular rate and rhythm with no rubs or gallops.  No thyromegaly or JVD noted.  No lower extremity edema. 2/4 pulses in all 4 extremities. Respiratory: Clear to auscultation with no wheezes or rales. Good inspiratory effort. Abdomen: Soft, nontender nondistended with normal bowel sounds x4 quadrants. Muskuloskeletal: No cyanosis, clubbing or edema noted bilaterally Neuro: CN II-XII intact, strength 5/5 x 4, sensation, reflexes intact Skin: No ulcerative lesions noted or rashes Psychiatry: Judgement and insight appear normal. Mood is appropriate for condition and setting          Labs on Admission:  Basic Metabolic Panel: Recent Labs  Lab 10/03/23 2035  NA 140  K 3.2*  CL 102  CO2 29  GLUCOSE 154*  BUN 21  CREATININE 3.95*  CALCIUM 8.5*   Liver Function Tests: Recent Labs  Lab 10/03/23 2035  AST 32  ALT 20  ALKPHOS 182*  BILITOT 0.9  PROT 6.9  ALBUMIN 3.0*   No results for input(s): "LIPASE", "AMYLASE" in the last 168 hours. No results for input(s): "AMMONIA" in the last 168 hours. CBC: Recent Labs  Lab 10/03/23 2035  WBC 4.3  HGB 8.3*  HCT 26.4*  MCV 107.3*  PLT 168   Cardiac Enzymes: No results for input(s): "CKTOTAL", "CKMB", "CKMBINDEX", "TROPONINI" in the last 168 hours.  BNP (last 3 results) Recent Labs    10/23/22 1420 05/13/23 1500  BNP 2,205.0* 3,351.0*    ProBNP (last 3 results) No results for input(s): "PROBNP" in  the last 8760 hours.  CBG: No results for input(s): "GLUCAP" in the last 168 hours.  Radiological Exams on Admission: No results found.  EKG: I independently viewed the EKG done and my findings are as followed: EKG was not done in the ED  Assessment/Plan Present on Admission:  GI bleed  Macrocytic anemia  Hypoalbuminemia due to protein-calorie malnutrition (HCC)  Hypokalemia  Chronic combined systolic and diastolic CHF (congestive heart failure) (HCC)  Essential hypertension  Mixed hyperlipidemia  Paroxysmal atrial fibrillation (HCC)  Acquired hypothyroidism  Active Problems:   Macrocytic anemia   End-stage renal disease on hemodialysis (HCC)   Essential hypertension   Paroxysmal atrial fibrillation (HCC)   Acquired hypothyroidism   Mixed hyperlipidemia   Chronic combined systolic and diastolic CHF (congestive heart failure) (HCC)   Hypoalbuminemia due to protein-calorie malnutrition (HCC)   Hypokalemia   GI bleed  GI bleed H/H= 8.3/26.4, this was 7.1/22.0 on 09/20/2023 Gastroenterologist  will be consulted in the morning  Macrocytic Anemia MCV 107.3, hemoglobin 8.3 Vitamin B12 and folate levels will be checked  Hypoalbuminemia possibly due to moderate protein calorie malnutrition Albumin 3.0, consider protein supplement when patient resumes oral intake  Hypokalemia K+ 3.2,  this will be replenished  ESRD on HD (MWF) Last dialysis was yesterday (3/24) Continue Sensipar Nephrology will be consulted for maintenance dialysis  Chronic combined systolic and diastolic CHF Echocardiogram from 08/22/23 with EF of 45 to 50% with grade 2 diastolic dysfunction  Manage volume with HD.     Essential hypertension Continue home hydralazine   Paroxysmal A-fib Rate control meds were held during last admission due to symptomatic bradycardia Patient is not on anticoagulation due to history of GI bleed in the past and symptomatic anemia requiring multiple transfusions.     Mixed hyperlipidemia Continue Crestor   Acquired hypothyroidism Continue Synthroid  DVT prophylaxis: SCDs  Code Status: Full code  Family Communication: None at bedside  Consults: Nephrology, gastroenterology  Severity of Illness: The appropriate patient status for this patient is OBSERVATION. Observation status is judged to be reasonable and necessary in order to provide the required intensity of service to ensure the patient's safety. The patient's presenting symptoms, physical exam findings, and initial radiographic and laboratory data in the context of their medical condition is felt to place them at decreased risk for further clinical deterioration. Furthermore, it is anticipated that the patient will be medically stable for discharge from the hospital within 2 midnights of admission.   Author: Frankey Shown, DO 10/04/2023 12:03 AM  For on call review [email or web address].

## 2023-10-03 NOTE — ED Provider Notes (Signed)
 [place] [facility] AT [facility] Provider Note   CSN: [id number] Arrival date & time: 10/03/23  1824     History {Add pertinent medical, surgical, social history, OB history to HPI:1} Chief Complaint  Patient presents with   Rectal Bleeding    Shawna [name] is a 83 y.o. female.  Patient complains of rectal bleeding today.  She has a history of a AVM in her colon.  She periodically gets transfused.  Patient is also a dialysis patient and had her dialysis today   Rectal Bleeding      Home Medications Prior to Admission medications   Medication Sig Start Date End Date Taking? Authorizing Provider  fluticasone (FLONASE) 50 MCG/ACT nasal spray Place 2 sprays into both nostrils daily. 09/19/23  Yes [provider]  calcitRIOL (ROCALTROL) 0.25 MCG capsule Take 7 capsules (1.75 mcg total) by mouth every Monday, Wednesday, and Friday with hemodialysis. 12/21/22   Lanae Boast, MD  cinacalcet (SENSIPAR) 30 MG tablet Take 1 tablet (30 mg total) by mouth every Monday, Wednesday, and Friday with hemodialysis. 12/19/22   Lanae Boast, MD  cyanocobalamin (VITAMIN B12) 1000 MCG/ML injection Inject 1,000 mcg into the muscle every 30 (thirty) days.    [provider]  famotidine (PEPCID) 20 MG tablet Take 1 tablet (20 mg total) by mouth daily as needed for heartburn or indigestion. 09/10/23 09/09/24  Cleora Fleet, MD  folic acid (FOLVITE) 1 MG tablet Take 1 mg by mouth daily. 05/24/22   [provider]  hydrALAZINE (APRESOLINE) 25 MG tablet Take 1 tablet (25 mg total) by mouth 2 (two) times daily. Hold for sbp <110 12/27/22   Lanae Boast, MD  hydrOXYzine (ATARAX) 25 MG tablet Take 25 mg by mouth every 6 (six) hours as needed for itching. 06/06/22   [provider]  levETIRAcetam (KEPPRA) 500 MG tablet Take 1 tablet (500 mg total) by mouth daily. 11/15/22   Leroy Sea, MD  levothyroxine (SYNTHROID) 50 MCG tablet Take 50 mcg by mouth daily.  08/27/23   [provider]  multivitamin (RENA-VIT) TABS tablet Take 1 tablet by mouth daily.    [provider]  nitroGLYCERIN (NITROSTAT) 0.4 MG SL tablet Place 0.4 mg under the tongue every 5 (five) minutes as needed for chest pain.    [provider]  octreotide (SANDOSTATIN LAR) 20 MG injection Inject 20 mg into the muscle every 28 (twenty-eight) days. 11/15/22   Leroy Sea, MD  ondansetron (ZOFRAN-ODT) 4 MG disintegrating tablet Take 4 mg by mouth every 8 (eight) hours as needed for nausea or vomiting. 05/22/23   [provider]  rosuvastatin (CRESTOR) 10 MG tablet Take 1 tablet by mouth once daily 06/06/22   [place] Ouch, [place] M, PA-C      Allergies    Amlodipine, Aspirin, Nitrofurantoin, Ranexa [ranolazine], Bactrim [sulfamethoxazole-trimethoprim], Iodinated contrast media, Iron, Tylenol [acetaminophen], Gabapentin, Sucroferric oxyhydroxide, Levaquin [levofloxacin in d5w], Levofloxacin, Plavix [clopidogrel bisulfate], Protonix [pantoprazole sodium], and Venofer [ferric oxide]    Review of Systems   Review of Systems  Gastrointestinal:  Positive for hematochezia.    Physical Exam Updated Vital Signs BP (!) 153/77   Pulse (!) 108   Temp 98.4 F (36.9 C) (Oral)   Resp 16   SpO2 92%  Physical Exam  ED Results / Procedures / Treatments   Labs (all labs ordered are listed, but only abnormal results are displayed) Labs Reviewed  COMPREHENSIVE METABOLIC PANEL - Abnormal; Notable for the following components:  Result Value   Potassium 3.2 (*)    Glucose, Bld 154 (*)    Creatinine, Ser 3.95 (*)    Calcium 8.5 (*)    Albumin 3.0 (*)    Alkaline Phosphatase 182 (*)    GFR, Estimated 11 (*)    All other components within normal limits  CBC - Abnormal; Notable for the following components:   RBC 2.46 (*)    Hemoglobin 8.3 (*)    HCT 26.4 (*)    MCV 107.3 (*)    RDW 29.1 (*)    All other components within normal limits  POC OCCULT  BLOOD, ED  I-STAT CHEM 8, ED  TYPE AND SCREEN    EKG None  Radiology No results found.  Procedures Procedures  {Document cardiac monitor, telemetry assessment procedure when appropriate:1}  Medications Ordered in ED Medications - No data to display  ED Course/ Medical Decision Making/ A&P   {I spoke with Dr. Levon Hedger who knows the patient well and he recommends having the patient be n.p.o. tonight and they will consult on her tomorrow she will be admitted for observation and checking periodic hemoglobins Click here for ABCD2, HEART and other calculatorsREFRESH Note before signing :1}                              Medical Decision Making Amount and/or Complexity of Data Reviewed Labs: ordered.  Risk Decision regarding hospitalization.   Rectal bleeding probably from her AVM in her colon.  Patient will be admitted to medicine with GI consult  {Document critical care time when appropriate:1} {Document review of labs and clinical decision tools ie heart score, Chads2Vasc2 etc:1}  {Document your independent review of radiology images, and any outside records:1} {Document your discussion with family members, caretakers, and with consultants:1} {Document social determinants of health affecting pt's care:1} {Document your decision making why or why not admission, treatments were needed:1} Final Clinical Impression(s) / ED Diagnoses Final diagnoses:  Rectal bleeding    Rx / DC Orders ED Discharge Orders     None

## 2023-10-04 ENCOUNTER — Other Ambulatory Visit: Payer: Self-pay

## 2023-10-04 DIAGNOSIS — I480 Paroxysmal atrial fibrillation: Secondary | ICD-10-CM | POA: Diagnosis not present

## 2023-10-04 DIAGNOSIS — D649 Anemia, unspecified: Secondary | ICD-10-CM | POA: Diagnosis not present

## 2023-10-04 DIAGNOSIS — D539 Nutritional anemia, unspecified: Secondary | ICD-10-CM | POA: Diagnosis not present

## 2023-10-04 DIAGNOSIS — I10 Essential (primary) hypertension: Secondary | ICD-10-CM | POA: Diagnosis not present

## 2023-10-04 DIAGNOSIS — N186 End stage renal disease: Secondary | ICD-10-CM | POA: Diagnosis not present

## 2023-10-04 DIAGNOSIS — K922 Gastrointestinal hemorrhage, unspecified: Secondary | ICD-10-CM | POA: Diagnosis not present

## 2023-10-04 LAB — COMPREHENSIVE METABOLIC PANEL
ALT: 17 U/L (ref 0–44)
AST: 26 U/L (ref 15–41)
Albumin: 2.7 g/dL — ABNORMAL LOW (ref 3.5–5.0)
Alkaline Phosphatase: 158 U/L — ABNORMAL HIGH (ref 38–126)
Anion gap: 12 (ref 5–15)
BUN: 21 mg/dL (ref 8–23)
CO2: 26 mmol/L (ref 22–32)
Calcium: 8.3 mg/dL — ABNORMAL LOW (ref 8.9–10.3)
Chloride: 103 mmol/L (ref 98–111)
Creatinine, Ser: 4.56 mg/dL — ABNORMAL HIGH (ref 0.44–1.00)
GFR, Estimated: 9 mL/min — ABNORMAL LOW (ref >60)
Glucose, Bld: 72 mg/dL (ref 70–99)
Potassium: 3.6 mmol/L (ref 3.5–5.1)
Sodium: 141 mmol/L (ref 135–145)
Total Bilirubin: 1.2 mg/dL (ref 0.0–1.2)
Total Protein: 6.2 g/dL — ABNORMAL LOW (ref 6.5–8.1)

## 2023-10-04 LAB — CBC
HCT: 23.4 % — ABNORMAL LOW (ref 36.0–46.0)
Hemoglobin: 7.4 g/dL — ABNORMAL LOW (ref 12.0–15.0)
MCH: 33.9 pg (ref 26.0–34.0)
MCHC: 31.6 g/dL (ref 30.0–36.0)
MCV: 107.3 fL — ABNORMAL HIGH (ref 80.0–100.0)
Platelets: 153 10*3/uL (ref 150–400)
RBC: 2.18 MIL/uL — ABNORMAL LOW (ref 3.87–5.11)
RDW: 28.4 % — ABNORMAL HIGH (ref 11.5–15.5)
WBC: 3.9 10*3/uL — ABNORMAL LOW (ref 4.0–10.5)
nRBC: 0 % (ref 0.0–0.2)

## 2023-10-04 LAB — VITAMIN B12: Vitamin B-12: 1519 pg/mL — ABNORMAL HIGH (ref 180–914)

## 2023-10-04 LAB — PHOSPHORUS: Phosphorus: 3.6 mg/dL (ref 2.5–4.6)

## 2023-10-04 LAB — MAGNESIUM: Magnesium: 1.6 mg/dL — ABNORMAL LOW (ref 1.7–2.4)

## 2023-10-04 LAB — IRON AND TIBC
Iron: 121 ug/dL (ref 28–170)
Saturation Ratios: 92 % — ABNORMAL HIGH (ref 10.4–31.8)
TIBC: 131 ug/dL — ABNORMAL LOW (ref 250–450)
UIBC: 10 ug/dL

## 2023-10-04 LAB — HEPATITIS B SURFACE ANTIGEN: Hepatitis B Surface Ag: NONREACTIVE

## 2023-10-04 LAB — TSH: TSH: 7.372 u[IU]/mL — ABNORMAL HIGH (ref 0.350–4.500)

## 2023-10-04 LAB — FOLATE: Folate: >40 ng/mL (ref >5.9)

## 2023-10-04 MED ORDER — HYDRALAZINE HCL 25 MG PO TABS
25.0000 mg | ORAL_TABLET | Freq: Two times a day (BID) | ORAL | Status: DC
  Administered 2023-10-04 – 2023-10-05 (×2): 25 mg via ORAL
  Filled 2023-10-04 (×3): qty 1

## 2023-10-04 MED ORDER — ONDANSETRON HCL 4 MG PO TABS
4.0000 mg | ORAL_TABLET | Freq: Four times a day (QID) | ORAL | Status: DC | PRN
Start: 2023-10-04 — End: 2023-10-08

## 2023-10-04 MED ORDER — CHLORHEXIDINE GLUCONATE CLOTH 2 % EX PADS
6.0000 | MEDICATED_PAD | Freq: Every day | CUTANEOUS | Status: DC
  Administered 2023-10-05 – 2023-10-08 (×3): 6 via TOPICAL

## 2023-10-04 MED ORDER — LEVOTHYROXINE SODIUM 50 MCG PO TABS
50.0000 ug | ORAL_TABLET | Freq: Every day | ORAL | Status: DC
  Administered 2023-10-04 – 2023-10-05 (×2): 50 ug via ORAL
  Filled 2023-10-04 (×2): qty 1

## 2023-10-04 MED ORDER — CINACALCET HCL 30 MG PO TABS
ORAL_TABLET | ORAL | Status: AC
  Filled 2023-10-04: qty 1

## 2023-10-04 MED ORDER — CINACALCET HCL 30 MG PO TABS
30.0000 mg | ORAL_TABLET | ORAL | Status: DC
  Administered 2023-10-04 – 2023-10-06 (×2): 30 mg via ORAL
  Filled 2023-10-04: qty 1

## 2023-10-04 MED ORDER — ONDANSETRON HCL 4 MG/2ML IJ SOLN
4.0000 mg | Freq: Four times a day (QID) | INTRAMUSCULAR | Status: DC | PRN
  Administered 2023-10-06 – 2023-10-07 (×2): 4 mg via INTRAVENOUS
  Filled 2023-10-04: qty 2

## 2023-10-04 MED ORDER — CHLORHEXIDINE GLUCONATE CLOTH 2 % EX PADS
6.0000 | MEDICATED_PAD | Freq: Every day | CUTANEOUS | Status: DC
  Administered 2023-10-06 – 2023-10-08 (×2): 6 via TOPICAL

## 2023-10-04 MED ORDER — HEPARIN SODIUM (PORCINE) 1000 UNIT/ML IJ SOLN
INTRAMUSCULAR | Status: AC
  Filled 2023-10-04: qty 4

## 2023-10-04 MED ORDER — ROSUVASTATIN CALCIUM 10 MG PO TABS
10.0000 mg | ORAL_TABLET | Freq: Every day | ORAL | Status: DC
  Administered 2023-10-05 – 2023-10-08 (×4): 10 mg via ORAL
  Filled 2023-10-04 (×5): qty 1

## 2023-10-04 NOTE — H&P (Incomplete)
 History and Physical    Patient: Shawna [name] ZOX:[id number] DOB: [birth year] DOA: 10/03/2023 DOS: the patient was seen and examined on 10/04/2023 PCP: [facility]  Patient coming from: Home  Chief Complaint:  Chief Complaint  Patient presents with  . Rectal Bleeding   HPI: Shawna [name] is a 83 y.o. female with medical history significant of hemodialysis (MWF), CAD status post CABG, HFrEF, T2DM, hypertension, GERD, atrial fibrillation (not on anticoagulants) who presents to the emergency department due to bright red rectal bleeding which started today.  She has a history of recurrent GI bleed.  Last dialysis session was yesterday.  ED Course:  In the emergency department, she was hemodynamically stable.  Workup in the ED showed ma, BMP was normal except for potassium of 3.2, blood glucose 154, creatinine 3.95, albumin 3.0 crocytic anemia Gastroenterologist (Dr. Levon Hedger) was consulted and will follow-up with patient in the morning per EDP.  Review of Systems: Review of systems as noted in the HPI. All other systems reviewed and are negative.   Past Medical History:  Diagnosis Date  . Acute on chronic respiratory failure with hypoxia (HCC) 10/10/2016  . Anxiety   . Arthritis   . AVM (arteriovenous malformation) of colon   . CAD (coronary artery disease)    a. s/p CABG in 2013 b. DES to D1 in 10/2016. c. cath in 07/2018 showing patent grafts with occlusion of D1 at prior stent site and progression of PDA disease --> medical management recommended  . Carotid artery disease (HCC)    a. 60-79% LICA, 03/2012   . Chronic bronchitis (HCC)   . Chronic HFrEF (heart failure with reduced ejection fraction) (HCC)   . Colon cancer (HCC) 1992  . Esophageal stricture   . ESRD on hemodialysis (HCC)    ESRD due to HTN, started dialysis 2011 and gets HD at [facility] with Dr Fausto Skillern on MWF schedule.  Access is LUA AVF as of Sept 2014.   Marland Kitchen GERD (gastroesophageal reflux disease)   .  High cholesterol 12/2011  . History of blood transfusion 07/2011; 12/2011; 01/2012 X 2; 04/2012  . History of gout   . History of lower GI bleeding   . Hypertension   . Iron deficiency anemia   . Jugular vein occlusion, right (HCC)   . Mitral regurgitation    a. Moderate by echo, 02/2012  . Mitral valve disease   . NSVT (nonsustained ventricular tachycardia) (HCC)   . Ovarian cancer (HCC) 1992  . PAF (paroxysmal atrial fibrillation) (HCC)   . Pneumonia ~ 2009  . PUD (peptic ulcer disease)   . TIA (transient ischemic attack)   . Tricuspid valve disease    Past Surgical History:  Procedure Laterality Date  . A/V FISTULAGRAM Left 10/04/2022   Procedure: A/V Fistulagram;  Surgeon: Renford Dills, MD;  Location: [facility] INVASIVE CV LAB;  Service: Cardiovascular;  Laterality: Left;  . A/V SHUNTOGRAM Left 03/19/2019   Procedure: A/V SHUNTOGRAM;  Surgeon: Renford Dills, MD;  Location: [facility] INVASIVE CV LAB;  Service: Cardiovascular;  Laterality: Left;  . ABDOMINAL HYSTERECTOMY  1992  . APPENDECTOMY  06/1990  . AV FISTULA PLACEMENT  07/2009   left upper arm  . AV FISTULA PLACEMENT Right 09/06/2016   Procedure: RIGHT FOREARM ARTERIOVENOUS (AV) GRAFT;  Surgeon: Sherren Kerns, MD;  Location: [facility] OR;  Service: Vascular;  Laterality: Right;  . AV FISTULA PLACEMENT N/A 02/24/2017   Procedure: INSERTION OF ARTERIOVENOUS (AV) GORE-TEX GRAFT ARM (BRACHIAL AXILLARY);  Surgeon: Renford Dills, MD;  Location: [facility] ORS;  Service: Vascular;  Laterality: N/A;  . AVGG REMOVAL Right 09/06/2016   Procedure: REMOVAL OF Right Arm ARTERIOVENOUS GORETEX GRAFT and Vein Patch angioplasty of brachial artery;  Surgeon: Chuck Hint, MD;  Location: [facility] OR;  Service: Vascular;  Laterality: Right;  . BIOPSY  09/26/2019   Procedure: BIOPSY;  Surgeon: Malissa Hippo, MD;  [facility];  Service: Endoscopy;;  . BIOPSY  11/01/2022   Procedure: BIOPSY;  Surgeon: Dolores Frame, MD;  [facility];  Service: Gastroenterology;;  . COLON RESECTION  1992  . COLON SURGERY    . COLONOSCOPY N/A 03/09/2019   Procedure: COLONOSCOPY;  Surgeon: Malissa Hippo, MD;  [facility];  Service: Endoscopy;  Laterality: N/A;  . COLONOSCOPY N/A 08/11/2022   Procedure: COLONOSCOPY;  Surgeon: Beverley Fiedler, MD;  Location: [facility] ENDOSCOPY;  Service: Gastroenterology;  Laterality: N/A;  . COLONOSCOPY WITH PROPOFOL N/A 06/08/2021   Procedure: COLONOSCOPY WITH PROPOFOL;  Surgeon: Dolores Frame, MD;  [facility];  Service: Gastroenterology;  Laterality: N/A;  9:05 /Patient is on dialysis Mon Wed Fri  . CORONARY ANGIOPLASTY WITH STENT PLACEMENT  12/15/11   "2"  . CORONARY ANGIOPLASTY WITH STENT PLACEMENT  y/2013   "1; makes total of 3" (05/02/2012)  . CORONARY ARTERY BYPASS GRAFT  06/13/2012   Procedure: CORONARY ARTERY BYPASS GRAFTING (CABG);  Surgeon: Delight Ovens, MD;  Location: [facility] OR;  Service: Open Heart Surgery;  Laterality: N/A;  cabg x four;  using left internal mammary artery, and left leg greater saphenous vein harvested endoscopically  . CORONARY STENT INTERVENTION N/A 10/13/2016   Procedure: Coronary Stent Intervention;  Surgeon: Lennette Bihari, MD;  Location: [facility] INVASIVE CV LAB;  Service: Cardiovascular;  Laterality: N/A;  . DIALYSIS/PERMA CATHETER REMOVAL N/A 04/18/2017   Procedure: DIALYSIS/PERMA CATHETER REMOVAL;  Surgeon: Renford Dills, MD;  Location: [facility] INVASIVE CV LAB;  Service: Cardiovascular;  Laterality: N/A;  . DILATION AND CURETTAGE OF UTERUS    . ENTEROSCOPY N/A 06/08/2021   Procedure: PUSH ENTEROSCOPY;  Surgeon: Dolores Frame, MD;  [facility];  Service: Gastroenterology;  Laterality: N/A;  . ENTEROSCOPY N/A 11/01/2022   Procedure: ENTEROSCOPY;  Surgeon: Dolores Frame, MD;  [facility];  Service: Gastroenterology;  Laterality: N/A;  . ENTEROSCOPY N/A 12/01/2022   Procedure: ENTEROSCOPY;   Surgeon: Benancio Deeds, MD;  Location: [facility] ENDOSCOPY;  Service: Gastroenterology;  Laterality: N/A;  . ESOPHAGOGASTRODUODENOSCOPY  01/20/2012   Procedure: ESOPHAGOGASTRODUODENOSCOPY (EGD);  Surgeon: Meryl Dare, MD,FACG;  Location: [facility] ENDOSCOPY;  Service: Endoscopy;  Laterality: N/A;  . ESOPHAGOGASTRODUODENOSCOPY N/A 03/26/2013   Procedure: ESOPHAGOGASTRODUODENOSCOPY (EGD);  Surgeon: Hilarie Fredrickson, MD;  Location: [facility] ENDOSCOPY;  Service: Endoscopy;  Laterality: N/A;  . ESOPHAGOGASTRODUODENOSCOPY N/A 04/30/2015   Procedure: ESOPHAGOGASTRODUODENOSCOPY (EGD);  Surgeon: Malissa Hippo, MD;  [facility];  Service: Endoscopy;  Laterality: N/A;  1pm - moved to 10/20 @ 1:10  . ESOPHAGOGASTRODUODENOSCOPY N/A 07/29/2016   Procedure: ESOPHAGOGASTRODUODENOSCOPY (EGD);  Surgeon: Ruffin Frederick, MD;  Location: [facility] ENDOSCOPY;  Service: Gastroenterology;  Laterality: N/A;  enteroscopy  . ESOPHAGOGASTRODUODENOSCOPY N/A 09/26/2019   Procedure: ESOPHAGOGASTRODUODENOSCOPY (EGD);  Surgeon: Malissa Hippo, MD;  [facility];  Service: Endoscopy;  Laterality: N/A;  1250  . ESOPHAGOGASTRODUODENOSCOPY N/A 08/11/2022   Procedure: ESOPHAGOGASTRODUODENOSCOPY (EGD);  Surgeon: Beverley Fiedler, MD;  Location: [name] ENDOSCOPY;  Service:  Gastroenterology;  Laterality: N/A;  . ESOPHAGOGASTRODUODENOSCOPY (EGD) WITH PROPOFOL N/A 02/05/2021   Procedure: ESOPHAGOGASTRODUODENOSCOPY (EGD) WITH PROPOFOL;  Surgeon: Lanelle Bal, DO;  [facility];  Service: Endoscopy;  Laterality: N/A;  . ESOPHAGOGASTRODUODENOSCOPY (EGD) WITH PROPOFOL N/A 08/27/2022   Procedure: ESOPHAGOGASTRODUODENOSCOPY (EGD) WITH PROPOFOL;  Surgeon: Lanelle Bal, DO;  [facility];  Service: Endoscopy;  Laterality: N/A;  . GIVENS CAPSULE STUDY N/A 03/07/2019   Procedure: GIVENS CAPSULE STUDY;  Surgeon: Malissa Hippo, MD;  [facility];  Service: Endoscopy;  Laterality: N/A;  7:30  . GIVENS CAPSULE  STUDY N/A 04/22/2021   Procedure: GIVENS CAPSULE STUDY;  Surgeon: Malissa Hippo, MD;  [facility];  Service: Endoscopy;  Laterality: N/A;  7:30  . GIVENS CAPSULE STUDY N/A 08/27/2022   Procedure: GIVENS CAPSULE STUDY;  Surgeon: Lanelle Bal, DO;  [facility];  Service: Endoscopy;  Laterality: N/A;  . HOT HEMOSTASIS N/A 08/11/2022   Procedure: HOT HEMOSTASIS (ARGON PLASMA COAGULATION/BICAP);  Surgeon: Beverley Fiedler, MD;  Location: [facility] ENDOSCOPY;  Service: Gastroenterology;  Laterality: N/A;  . HOT HEMOSTASIS N/A 12/01/2022   Procedure: HOT HEMOSTASIS (ARGON PLASMA COAGULATION/BICAP);  Surgeon: Benancio Deeds, MD;  Location: [facility] ENDOSCOPY;  Service: Gastroenterology;  Laterality: N/A;  . INSERTION OF DIALYSIS CATHETER N/A 10/05/2020   Procedure: ABORTED TUNNELED DIALYSIS CATHETER PLACEMENT RIGHT INTERNAL JUGULAR VEIN ;  Surgeon: Lucretia Roers, MD;  Location: [facility]S;  Service: General;  Laterality: N/A;  . INTRAOPERATIVE TRANSESOPHAGEAL ECHOCARDIOGRAM  06/13/2012   Procedure: INTRAOPERATIVE TRANSESOPHAGEAL ECHOCARDIOGRAM;  Surgeon: Delight Ovens, MD;  Location: [facility] OR;  Service: Open Heart Surgery;  Laterality: N/A;  . IR DIALY SHUNT INTRO NEEDLE/INTRACATH INITIAL W/IMG LEFT Left 10/06/2020  . IR FLUORO GUIDE CV LINE RIGHT  06/17/2020  . IR FLUORO GUIDE CV LINE RIGHT  11/12/2022  . IR GENERIC HISTORICAL  07/26/2016   IR FLUORO GUIDE CV LINE RIGHT 07/26/2016 Berdine Dance, MD [facility]  . IR GENERIC HISTORICAL  07/26/2016   IR US GUIDE VASC ACCESS RIGHT 07/26/2016 Berdine Dance, MD [facility]  . IR GENERIC HISTORICAL  08/02/2016   IR US GUIDE VASC ACCESS RIGHT 08/02/2016 Berdine Dance, MD [facility]  . IR GENERIC HISTORICAL  08/02/2016   IR FLUORO GUIDE CV LINE RIGHT 08/02/2016 Berdine Dance, MD [facility]  . IR RADIOLOGY PERIPHERAL GUIDED IV START  03/28/2017  . IR REMOVAL TUN CV CATH W/O FL  08/11/2020  . IR REMOVAL TUN CV CATH W/O FL  11/15/2022  . IR  THROMBECTOMY AV FISTULA W/THROMBOLYSIS INC/SHUNT/IMG LEFT Left 06/17/2020  . IR US GUIDE VASC ACCESS LEFT  06/17/2020  . IR US GUIDE VASC ACCESS RIGHT  03/28/2017  . IR US GUIDE VASC ACCESS RIGHT  06/17/2020  . IR US GUIDE VASC ACCESS RIGHT  11/12/2022  . LEFT HEART CATH AND CORONARY ANGIOGRAPHY N/A 09/20/2016   Procedure: Left Heart Cath and Coronary Angiography;  Surgeon: Lyn Records, MD;  Location: [facility] INVASIVE CV LAB;  Service: Cardiovascular;  Laterality: N/A;  . LEFT HEART CATH AND CORS/GRAFTS ANGIOGRAPHY N/A 10/13/2016   Procedure: Left Heart Cath and Cors/Grafts Angiography;  Surgeon: Lennette Bihari, MD;  Location: [facility] INVASIVE CV LAB;  Service: Cardiovascular;  Laterality: N/A;  . LEFT HEART CATH AND CORS/GRAFTS ANGIOGRAPHY N/A 07/13/2018   Procedure: LEFT HEART CATH AND CORS/GRAFTS ANGIOGRAPHY;  Surgeon: Swaziland, Peter M, MD;  Location: [name] INVASIVE CV LAB;  Service: Cardiovascular;  Laterality: N/A;  . LEFT HEART CATH AND CORS/GRAFTS ANGIOGRAPHY N/A 07/22/2021   Procedure: LEFT HEART CATH AND CORS/GRAFTS ANGIOGRAPHY;  Surgeon: Runell Gess, MD;  Location: [facility] INVASIVE CV LAB;  Service: Cardiovascular;  Laterality: N/A;  . LEFT HEART CATHETERIZATION WITH CORONARY ANGIOGRAM N/A 12/15/2011   Procedure: LEFT HEART CATHETERIZATION WITH CORONARY ANGIOGRAM;  Surgeon: Kathleene Hazel, MD;  Location: [facility] CATH LAB;  Service: Cardiovascular;  Laterality: N/A;  . LEFT HEART CATHETERIZATION WITH CORONARY ANGIOGRAM N/A 01/10/2012   Procedure: LEFT HEART CATHETERIZATION WITH CORONARY ANGIOGRAM;  Surgeon: Peter M Swaziland, MD;  Location: [facility] CATH LAB;  Service: Cardiovascular;  Laterality: N/A;  . LEFT HEART CATHETERIZATION WITH CORONARY ANGIOGRAM N/A 06/08/2012   Procedure: LEFT HEART CATHETERIZATION WITH CORONARY ANGIOGRAM;  Surgeon: Kathleene Hazel, MD;  Location: [facility] CATH LAB;  Service: Cardiovascular;  Laterality: N/A;  . LEFT HEART CATHETERIZATION WITH CORONARY/GRAFT ANGIOGRAM N/A 12/10/2013    Procedure: LEFT HEART CATHETERIZATION WITH Isabel Caprice;  Surgeon: Corky Crafts, MD;  Location: [facility] CATH LAB;  Service: Cardiovascular;  Laterality: N/A;  . OVARY SURGERY     ovarian cancer  . POLYPECTOMY  03/09/2019   Procedure: POLYPECTOMY;  Surgeon: Malissa Hippo, MD;  [facility];  Service: Endoscopy;;  cecal   . POLYPECTOMY N/A 09/26/2019   Procedure: DUODENAL POLYPECTOMY;  Surgeon: Malissa Hippo, MD;  [facility];  Service: Endoscopy;  Laterality: N/A;  . POLYPECTOMY  08/11/2022   Procedure: POLYPECTOMY;  Surgeon: Beverley Fiedler, MD;  Location: [facility] ENDOSCOPY;  Service: Gastroenterology;;  . REVISION OF ARTERIOVENOUS GORETEX GRAFT N/A 02/24/2017   Procedure: REVISION OF ARTERIOVENOUS GORETEX GRAFT (RESECTION);  Surgeon: Renford Dills, MD;  Location: [facility] ORS;  Service: Vascular;  Laterality: N/A;  . REVISON OF ARTERIOVENOUS FISTULA Left 06/19/2020   Procedure: REVISION OF LEFT UPPER ARM AV GRAFT WITH INTERPOSITION JUMP GRAFT USING GORE LIMB;  Surgeon: Cephus Shelling, MD;  Location: [facility] OR;  Service: Vascular;  Laterality: Left;  . SHUNTOGRAM N/A 10/15/2013   Procedure: Fistulogram;  Surgeon: Nada Libman, MD;  Location: [facility] CATH LAB;  Service: Cardiovascular;  Laterality: N/A;  . SUBMUCOSAL TATTOO INJECTION  11/01/2022   Procedure: SUBMUCOSAL TATTOO INJECTION;  Surgeon: Dolores Frame, MD;  [facility];  Service: Gastroenterology;;  . Bonnetta Barry / ARTERIOVENOUS GRAFT REVISION  2011   left upper arm  . TUBAL LIGATION  1980's  . UPPER EXTREMITY ANGIOGRAPHY Bilateral 12/06/2016   Procedure: Upper Extremity Angiography;  Surgeon: Renford Dills, MD;  Location: [facility] INVASIVE CV LAB;  Service: Cardiovascular;  Laterality: Bilateral;  . UPPER EXTREMITY INTERVENTION Left 06/06/2017   Procedure: UPPER EXTREMITY INTERVENTION;  Surgeon: Renford Dills, MD;  Location: [facility] INVASIVE CV LAB;  Service: Cardiovascular;   Laterality: Left;  . UPPER EXTREMITY VENOGRAPHY Left 10/04/2022   Procedure: UPPER EXTREMITY VENOGRAPHY;  Surgeon: Renford Dills, MD;  Location: [facility] INVASIVE CV LAB;  Service: Cardiovascular;  Laterality: Left;    Social History:  reports that she has never smoked. She has never used smokeless tobacco. She reports that she does not drink alcohol and does not use drugs.   Allergies  Allergen Reactions  . Amlodipine Swelling  . Aspirin Other (See Comments)    High Doses Mess up her stomach; "makes my bowels have blood in them". Takes 81 mg EC Aspirin   . Nitrofurantoin Hives  . Ranexa [Ranolazine] Other (See Comments)    Myoclonus-hospitalized   . Bactrim [  Sulfamethoxazole-Trimethoprim] Rash  . Iodinated Contrast Media Itching and Other (See Comments)  . Iron Itching and Other (See Comments)    "they gave me iron in dialysis; had to give me Benadryl cause I had to have the iron" (05/02/2012)  . Tylenol [Acetaminophen] Itching and Other (See Comments)    Makes her feet on fire per pt - can tolerate per pt  . Gabapentin Other (See Comments)    Unknown reaction  . Sucroferric Oxyhydroxide Other (See Comments)    Unknown  . Levaquin [Levofloxacin In D5w] Rash  . Levofloxacin Rash and Other (See Comments)  . Plavix [Clopidogrel Bisulfate] Rash  . Protonix [Pantoprazole Sodium] Rash  . Venofer [Ferric Oxide] Itching and Other (See Comments)    Patient reports using Benadryl prior to doses as [facility]    Family History  Problem Relation Age of Onset  . Heart disease Mother        Heart Disease before age 39  . Hyperlipidemia Mother   . Hypertension Mother   . Diabetes Mother   . Heart attack Mother   . Heart disease Father        Heart Disease before age 57  . Hyperlipidemia Father   . Hypertension Father   . Diabetes Father   . Diabetes Sister   . Hypertension Sister   . Diabetes Brother   . Hyperlipidemia Brother   . Heart attack Brother   . Hypertension  Sister   . Heart attack Brother   . Colon cancer Child 57  . Other Other        noncontributory for early CAD  . Esophageal cancer Neg Hx   . Liver disease Neg Hx   . Kidney disease Neg Hx   . Colon polyps Neg Hx       Prior to Admission medications   Medication Sig Start Date End Date Taking? Authorizing Provider  fluticasone (FLONASE) 50 MCG/ACT nasal spray Place 2 sprays into both nostrils daily. 09/19/23  Yes [provider]  calcitRIOL (ROCALTROL) 0.25 MCG capsule Take 7 capsules (1.75 mcg total) by mouth every Monday, Wednesday, and Friday with hemodialysis. 12/21/22   Lanae Boast, MD  cinacalcet (SENSIPAR) 30 MG tablet Take 1 tablet (30 mg total) by mouth every Monday, Wednesday, and Friday with hemodialysis. 12/19/22   Lanae Boast, MD  cyanocobalamin (VITAMIN B12) 1000 MCG/ML injection Inject 1,000 mcg into the muscle every 30 (thirty) days.    [provider]  famotidine (PEPCID) 20 MG tablet Take 1 tablet (20 mg total) by mouth daily as needed for heartburn or indigestion. 09/10/23 09/09/24  Cleora Fleet, MD  folic acid (FOLVITE) 1 MG tablet Take 1 mg by mouth daily. 05/24/22   [provider]  hydrALAZINE (APRESOLINE) 25 MG tablet Take 1 tablet (25 mg total) by mouth 2 (two) times daily. Hold for sbp <110 12/27/22   Lanae Boast, MD  hydrOXYzine (ATARAX) 25 MG tablet Take 25 mg by mouth every 6 (six) hours as needed for itching. 06/06/22   [provider]  levETIRAcetam (KEPPRA) 500 MG tablet Take 1 tablet (500 mg total) by mouth daily. 11/15/22   Leroy Sea, MD  levothyroxine (SYNTHROID) 50 MCG tablet Take 50 mcg by mouth daily. 08/27/23   [provider]  multivitamin (RENA-VIT) TABS tablet Take 1 tablet by mouth daily.    [provider]  nitroGLYCERIN (NITROSTAT) 0.4 MG SL tablet Place 0.4 mg under the tongue every 5 (five) minutes as needed  for chest pain.    [provider]  octreotide (SANDOSTATIN LAR) 20 MG  injection Inject 20 mg into the muscle every 28 (twenty-eight) days. 11/15/22   Leroy Sea, MD  ondansetron (ZOFRAN-ODT) 4 MG disintegrating tablet Take 4 mg by mouth every 8 (eight) hours as needed for nausea or vomiting. 05/22/23   [provider]  rosuvastatin (CRESTOR) 10 MG tablet Take 1 tablet by mouth once daily 06/06/22   Iran Ouch, Lennart Pall, PA-C    Physical Exam: BP (!) 148/67   Pulse 73   Temp 97.8 F (36.6 C) (Oral)   Resp 19   SpO2 100%   General: 83 y.o. year-old female well developed well nourished in no acute distress.  Alert and oriented x3. HEENT: NCAT, EOMI Neck: Supple, trachea medial Cardiovascular: Regular rate and rhythm with no rubs or gallops.  No thyromegaly or JVD noted.  No lower extremity edema. 2/4 pulses in all 4 extremities. Respiratory: Clear to auscultation with no wheezes or rales. Good inspiratory effort. Abdomen: Soft, nontender nondistended with normal bowel sounds x4 quadrants. Muskuloskeletal: No cyanosis, clubbing or edema noted bilaterally Neuro: CN II-XII intact, strength 5/5 x 4, sensation, reflexes intact Skin: No ulcerative lesions noted or rashes Psychiatry: Judgement and insight appear normal. Mood is appropriate for condition and setting          Labs on Admission:  Basic Metabolic Panel: Recent Labs  Lab 10/03/23 2035  NA 140  K 3.2*  CL 102  CO2 29  GLUCOSE 154*  BUN 21  CREATININE 3.95*  CALCIUM 8.5*   Liver Function Tests: Recent Labs  Lab 10/03/23 2035  AST 32  ALT 20  ALKPHOS 182*  BILITOT 0.9  PROT 6.9  ALBUMIN 3.0*   No results for input(s): "LIPASE", "AMYLASE" in the last 168 hours. No results for input(s): "AMMONIA" in the last 168 hours. CBC: Recent Labs  Lab 10/03/23 2035  WBC 4.3  HGB 8.3*  HCT 26.4*  MCV 107.3*  PLT 168   Cardiac Enzymes: No results for input(s): "CKTOTAL", "CKMB", "CKMBINDEX", "TROPONINI" in the last 168 hours.  BNP (last 3 results) Recent Labs     10/23/22 1420 05/13/23 1500  BNP 2,205.0* 3,351.0*    ProBNP (last 3 results) No results for input(s): "PROBNP" in the last 8760 hours.  CBG: No results for input(s): "GLUCAP" in the last 168 hours.  Radiological Exams on Admission: No results found.  EKG: I independently viewed the EKG done and my findings are as followed: EKG was not done in the ED  Assessment/Plan Present on Admission: . GI bleed . Macrocytic anemia . Hypoalbuminemia due to protein-calorie malnutrition (HCC) . Hypokalemia . Chronic combined systolic and diastolic CHF (congestive heart failure) (HCC) . Essential hypertension . Mixed hyperlipidemia . Paroxysmal atrial fibrillation (HCC) . Acquired hypothyroidism  Active Problems:   Macrocytic anemia   End-stage renal disease on hemodialysis (HCC)   Essential hypertension   Paroxysmal atrial fibrillation (HCC)   Acquired hypothyroidism   Mixed hyperlipidemia   Chronic combined systolic and diastolic CHF (congestive heart failure) (HCC)   Hypoalbuminemia due to protein-calorie malnutrition (HCC)   Hypokalemia   GI bleed  GI bleed H/H= 8.3/26.4, this was 7.1/22.0 on 09/20/2023 Gastroenterologist  will be consulted in the morning  Macrocytic Anemia MCV 107.3, hemoglobin 8.3 Vitamin B12 and folate levels will be checked  Hypoalbuminemia possibly due to moderate protein calorie malnutrition Albumin 3.0, consider protein supplement when patient resumes oral intake  Hypokalemia K+ 3.2,  this will be replenished  ESRD on HD (MWF) Last dialysis was yesterday (3/24) Nephrology will be consulted for maintenance dialysis  Chronic combined systolic and diastolic CHF Echocardiogram from 08/22/23 with EF of 45 to 50% with grade 2 diastolic dysfunction  Manage volume with HD.     Essential hypertension Continue home hydralazine   Paroxysmal A-fib Rate control meds were held during last admission due to symptomatic bradycardia Patient is not on  anticoagulation due to history of GI bleed in the past and symptomatic anemia requiring multiple transfusions.    Mixed hyperlipidemia Continue Crestor   Acquired hypothyroidism Continue Synthroid  DVT prophylaxis: SCDs  Code Status: Full code  Family Communication: ***   Consults: Nephrology, gastroenterology  Severity of Illness: The appropriate patient status for this patient is OBSERVATION. Observation status is judged to be reasonable and necessary in order to provide the required intensity of service to ensure the patient's safety. The patient's presenting symptoms, physical exam findings, and initial radiographic and laboratory data in the context of their medical condition is felt to place them at decreased risk for further clinical deterioration. Furthermore, it is anticipated that the patient will be medically stable for discharge from the hospital within 2 midnights of admission.   Author: Frankey Shown, DO 10/04/2023 12:03 AM  For on call review [email or web address].

## 2023-10-04 NOTE — Consult Note (Addendum)
 [facility]  INPATIENT CONSULTATION  Reason for Consultation: ESRD Requesting Provider: Dr. Flossie Dibble  HPI: Shawna [name] is an 83 y.o. female with ESRD on HD MWF, CAD h/o CABG, combined CHF, DM, HTN, A fib, GERD currently admitted GIB and nephrology is consulted for comanagement of her ESRD and assoc conditions.   Presented with [facility] last PM to [facility] ED.  Says normal stool and blood in toilet.   She was admitted and GI is consulted. Admission Hb in 8s, f/u pending.   This AM she is feeling ok.  Agreeable to dialysis today.  Says dialysi has been going fine but thinks her EDW needs to be lowered.   PMH: Past Medical History:  Diagnosis Date   Acute on chronic respiratory failure with hypoxia (HCC) 10/10/2016   Anxiety    Arthritis    AVM (arteriovenous malformation) of colon    CAD (coronary artery disease)    a. s/p CABG in 2013 b. DES to D1 in 10/2016. c. cath in 07/2018 showing patent grafts with occlusion of D1 at prior stent site and progression of PDA disease --> medical management recommended   Carotid artery disease (HCC)    a. 60-79% LICA, 03/2012    Chronic bronchitis (HCC)    Chronic HFrEF (heart failure with reduced ejection fraction) (HCC)    Colon cancer (HCC) 1992   Esophageal stricture    ESRD on hemodialysis (HCC)    ESRD due to HTN, started dialysis 2011 and gets HD at [facility] with Dr Fausto Skillern on MWF schedule.  Access is LUA AVF as of Sept 2014.    GERD (gastroesophageal reflux disease)    High cholesterol 12/2011   History of blood transfusion 07/2011; 12/2011; 01/2012 X 2; 04/2012   History of gout    History of lower GI bleeding    Hypertension    Iron deficiency anemia    Jugular vein occlusion, right (HCC)    Mitral regurgitation    a. Moderate by echo, 02/2012   Mitral valve disease    NSVT (nonsustained ventricular tachycardia) (HCC)    Ovarian cancer (HCC) 1992   PAF (paroxysmal atrial fibrillation) (HCC)    Pneumonia ~ 2009   PUD  (peptic ulcer disease)    TIA (transient ischemic attack)    Tricuspid valve disease    PSH: Past Surgical History:  Procedure Laterality Date   A/V FISTULAGRAM Left 10/04/2022   Procedure: A/V Fistulagram;  Surgeon: Renford Dills, MD;  Location: [facility] INVASIVE CV LAB;  Service: Cardiovascular;  Laterality: Left;   A/V SHUNTOGRAM Left 03/19/2019   Procedure: A/V SHUNTOGRAM;  Surgeon: Renford Dills, MD;  Location: [facility] INVASIVE CV LAB;  Service: Cardiovascular;  Laterality: Left;   ABDOMINAL HYSTERECTOMY  1992   APPENDECTOMY  06/1990   AV FISTULA PLACEMENT  07/2009   left upper arm   AV FISTULA PLACEMENT Right 09/06/2016   Procedure: RIGHT FOREARM ARTERIOVENOUS (AV) GRAFT;  Surgeon: Sherren Kerns, MD;  [facility];  Service: Vascular;  Laterality: Right;   AV FISTULA PLACEMENT N/A 02/24/2017   Procedure: INSERTION OF ARTERIOVENOUS (AV) GORE-TEX GRAFT ARM (BRACHIAL AXILLARY);  Surgeon: Renford Dills, MD;  Location: [facility] ORS;  Service: Vascular;  Laterality: N/A;   AVGG REMOVAL Right 09/06/2016   Procedure: REMOVAL OF Right Arm ARTERIOVENOUS GORETEX GRAFT and Vein Patch angioplasty of brachial artery;  Surgeon: Chuck Hint, MD;  Location: [facility] OR;  Service: Vascular;  Laterality: Right;   BIOPSY  09/26/2019  Procedure: BIOPSY;  Surgeon: Malissa Hippo, MD;  [facility] ENDO [facility];  Service: Endoscopy;;   BIOPSY  11/01/2022   Procedure: BIOPSY;  Surgeon: Dolores Frame, MD;  [facility];  Service: Gastroenterology;;   COLON RESECTION  1992   COLON SURGERY     COLONOSCOPY N/A 03/09/2019   Procedure: COLONOSCOPY;  Surgeon: Malissa Hippo, MD;  [facility];  Service: Endoscopy;  Laterality: N/A;   COLONOSCOPY N/A 08/11/2022   Procedure: COLONOSCOPY;  Surgeon: Beverley Fiedler, MD;  Location: [facility] ENDOSCOPY;  Service: Gastroenterology;  Laterality: N/A;   COLONOSCOPY WITH PROPOFOL N/A 06/08/2021   Procedure: COLONOSCOPY WITH PROPOFOL;   Surgeon: Dolores Frame, MD;  [facility];  Service: Gastroenterology;  Laterality: N/A;  9:05 /Patient is on dialysis Mon Wed Fri   CORONARY ANGIOPLASTY WITH STENT PLACEMENT  12/15/11   "2"   CORONARY ANGIOPLASTY WITH STENT PLACEMENT  y/2013   "1; makes total of 3" (05/02/2012)   CORONARY ARTERY BYPASS GRAFT  06/13/2012   Procedure: CORONARY ARTERY BYPASS GRAFTING (CABG);  Surgeon: Delight Ovens, MD;  Location: [facility] OR;  Service: Open Heart Surgery;  Laterality: N/A;  cabg x four;  using left internal mammary artery, and left leg greater saphenous vein harvested endoscopically   CORONARY STENT INTERVENTION N/A 10/13/2016   Procedure: Coronary Stent Intervention;  Surgeon: Lennette Bihari, MD;  [facility];  Service: Cardiovascular;  Laterality: N/A;   DIALYSIS/PERMA CATHETER REMOVAL N/A 04/18/2017   Procedure: DIALYSIS/PERMA CATHETER REMOVAL;  Surgeon: Renford Dills, MD;  Location: [facility] INVASIVE CV LAB;  Service: Cardiovascular;  Laterality: N/A;   DILATION AND CURETTAGE OF UTERUS     ENTEROSCOPY N/A 06/08/2021   Procedure: PUSH ENTEROSCOPY;  Surgeon: Dolores Frame, MD;  [facility];  Service: Gastroenterology;  Laterality: N/A;   ENTEROSCOPY N/A 11/01/2022   Procedure: ENTEROSCOPY;  Surgeon: Dolores Frame, MD;  [facility];  Service: Gastroenterology;  Laterality: N/A;   ENTEROSCOPY N/A 12/01/2022   Procedure: ENTEROSCOPY;  Surgeon: Benancio Deeds, MD;  Location: [facility] ENDOSCOPY;  Service: Gastroenterology;  Laterality: N/A;   ESOPHAGOGASTRODUODENOSCOPY  01/20/2012   Procedure: ESOPHAGOGASTRODUODENOSCOPY (EGD);  Surgeon: Meryl Dare, MD,FACG;  Location: [facility] ENDOSCOPY;  Service: Endoscopy;  Laterality: N/A;   ESOPHAGOGASTRODUODENOSCOPY N/A 03/26/2013   Procedure: ESOPHAGOGASTRODUODENOSCOPY (EGD);  Surgeon: Hilarie Fredrickson, MD;  Location: [facility] ENDOSCOPY;  Service: Endoscopy;  Laterality: N/A;    ESOPHAGOGASTRODUODENOSCOPY N/A 04/30/2015   Procedure: ESOPHAGOGASTRODUODENOSCOPY (EGD);  Surgeon: Malissa Hippo, MD;  [facility];  Service: Endoscopy;  Laterality: N/A;  1pm - moved to 10/20 @ 1:10   ESOPHAGOGASTRODUODENOSCOPY N/A 07/29/2016   Procedure: ESOPHAGOGASTRODUODENOSCOPY (EGD);  Surgeon: Ruffin Frederick, MD;  Location: [facility] ENDOSCOPY;  Service: Gastroenterology;  Laterality: N/A;  enteroscopy   ESOPHAGOGASTRODUODENOSCOPY N/A 09/26/2019   Procedure: ESOPHAGOGASTRODUODENOSCOPY (EGD);  Surgeon: Malissa Hippo, MD;  [facility];  Service: Endoscopy;  Laterality: N/A;  1250   ESOPHAGOGASTRODUODENOSCOPY N/A 08/11/2022   Procedure: ESOPHAGOGASTRODUODENOSCOPY (EGD);  Surgeon: Beverley Fiedler, MD;  Location: [facility] ENDOSCOPY;  Service: Gastroenterology;  Laterality: N/A;   ESOPHAGOGASTRODUODENOSCOPY (EGD) WITH PROPOFOL N/A 02/05/2021   Procedure: ESOPHAGOGASTRODUODENOSCOPY (EGD) WITH PROPOFOL;  Surgeon: Lanelle Bal, DO;  [facility];  Service: Endoscopy;  Laterality: N/A;   ESOPHAGOGASTRODUODENOSCOPY (EGD) WITH PROPOFOL N/A 08/27/2022   Procedure: ESOPHAGOGASTRODUODENOSCOPY (EGD) WITH PROPOFOL;  Surgeon: Lanelle Bal, DO;  [facility];  Service: Endoscopy;  Laterality: N/A;   GIVENS CAPSULE STUDY N/A 03/07/2019   Procedure: GIVENS CAPSULE STUDY;  Surgeon: Malissa Hippo, MD;  [facility];  Service: Endoscopy;  Laterality: N/A;  7:30   GIVENS CAPSULE STUDY N/A 04/22/2021   Procedure: GIVENS CAPSULE STUDY;  Surgeon: Malissa Hippo, MD;  [facility];  Service: Endoscopy;  Laterality: N/A;  7:30   GIVENS CAPSULE STUDY N/A 08/27/2022   Procedure: GIVENS CAPSULE STUDY;  Surgeon: Lanelle Bal, DO;  [facility];  Service: Endoscopy;  Laterality: N/A;   HOT HEMOSTASIS N/A 08/11/2022   Procedure: HOT HEMOSTASIS (ARGON PLASMA COAGULATION/BICAP);  Surgeon: Beverley Fiedler, MD;  Location: [facility] ENDOSCOPY;  Service:  Gastroenterology;  Laterality: N/A;   HOT HEMOSTASIS N/A 12/01/2022   Procedure: HOT HEMOSTASIS (ARGON PLASMA COAGULATION/BICAP);  Surgeon: Benancio Deeds, MD;  Location: [facility] ENDOSCOPY;  Service: Gastroenterology;  Laterality: N/A;   INSERTION OF DIALYSIS CATHETER N/A 10/05/2020   Procedure: ABORTED TUNNELED DIALYSIS CATHETER PLACEMENT RIGHT INTERNAL JUGULAR VEIN ;  Surgeon: Lucretia Roers, MD;  Location: [facility] ORS;  Service: General;  Laterality: N/A;   INTRAOPERATIVE TRANSESOPHAGEAL ECHOCARDIOGRAM  06/13/2012   Procedure: INTRAOPERATIVE TRANSESOPHAGEAL ECHOCARDIOGRAM;  Surgeon: Delight Ovens, MD;  Location: [facility] OR;  Service: Open Heart Surgery;  Laterality: N/A;   IR DIALY SHUNT INTRO NEEDLE/INTRACATH INITIAL W/IMG LEFT Left 10/06/2020   IR FLUORO GUIDE CV LINE RIGHT  06/17/2020   IR FLUORO GUIDE CV LINE RIGHT  11/12/2022   IR GENERIC HISTORICAL  07/26/2016   IR FLUORO GUIDE CV LINE RIGHT 07/26/2016 Berdine Dance, MD [facility]   IR GENERIC HISTORICAL  07/26/2016   IR US GUIDE VASC ACCESS RIGHT 07/26/2016 Berdine Dance, MD [facility]   IR GENERIC HISTORICAL  08/02/2016   IR US GUIDE VASC ACCESS RIGHT 08/02/2016 Berdine Dance, MD [facility]   IR GENERIC HISTORICAL  08/02/2016   IR FLUORO GUIDE CV LINE RIGHT 08/02/2016 Berdine Dance, MD [facility]   IR RADIOLOGY PERIPHERAL GUIDED IV START  03/28/2017   IR REMOVAL TUN CV CATH W/O FL  08/11/2020   IR REMOVAL TUN CV CATH W/O FL  11/15/2022   IR THROMBECTOMY AV FISTULA W/THROMBOLYSIS INC/SHUNT/IMG LEFT Left 06/17/2020   IR US GUIDE VASC ACCESS LEFT  06/17/2020   IR US GUIDE VASC ACCESS RIGHT  03/28/2017   IR US GUIDE VASC ACCESS RIGHT  06/17/2020   IR US GUIDE VASC ACCESS RIGHT  11/12/2022   LEFT HEART CATH AND CORONARY ANGIOGRAPHY N/A 09/20/2016   Procedure: Left Heart Cath and Coronary Angiography;  Surgeon: Lyn Records, MD;  Location: [facility] INVASIVE CV LAB;  Service: Cardiovascular;  Laterality: N/A;   LEFT HEART CATH AND CORS/GRAFTS ANGIOGRAPHY  N/A 10/13/2016   Procedure: Left Heart Cath and Cors/Grafts Angiography;  Surgeon: Lennette Bihari, MD;  [facility];  Service: Cardiovascular;  Laterality: N/A;   LEFT HEART CATH AND CORS/GRAFTS ANGIOGRAPHY N/A 07/13/2018   Procedure: LEFT HEART CATH AND CORS/GRAFTS ANGIOGRAPHY;  Surgeon: Swaziland, Peter M, MD;  Location: [facility] INVASIVE CV LAB;  Service: Cardiovascular;  Laterality: N/A;   LEFT HEART CATH AND CORS/GRAFTS ANGIOGRAPHY N/A 07/22/2021   Procedure: LEFT HEART CATH AND CORS/GRAFTS ANGIOGRAPHY;  Surgeon: Runell Gess, MD;  [facility] LAB;  Service: Cardiovascular;  Laterality: N/A;   LEFT HEART CATHETERIZATION WITH CORONARY ANGIOGRAM N/A 12/15/2011   Procedure: LEFT HEART CATHETERIZATION WITH CORONARY ANGIOGRAM;  Surgeon: Cristal Deer  Ellene Route, MD;  Location: [facility] CATH LAB;  Service: Cardiovascular;  Laterality: N/A;   LEFT HEART CATHETERIZATION WITH CORONARY ANGIOGRAM N/A 01/10/2012   Procedure: LEFT HEART CATHETERIZATION WITH CORONARY ANGIOGRAM;  Surgeon: Peter M Swaziland, MD;  Location: [facility] CATH LAB;  Service: Cardiovascular;  Laterality: N/A;   LEFT HEART CATHETERIZATION WITH CORONARY ANGIOGRAM N/A 06/08/2012   Procedure: LEFT HEART CATHETERIZATION WITH CORONARY ANGIOGRAM;  Surgeon: Kathleene Hazel, MD;  Location: [facility] CATH LAB;  Service: Cardiovascular;  Laterality: N/A;   LEFT HEART CATHETERIZATION WITH CORONARY/GRAFT ANGIOGRAM N/A 12/10/2013   Procedure: LEFT HEART CATHETERIZATION WITH Isabel Caprice;  Surgeon: Corky Crafts, MD;  Location: [facility] CATH LAB;  Service: Cardiovascular;  Laterality: N/A;   OVARY SURGERY     ovarian cancer   POLYPECTOMY  03/09/2019   Procedure: POLYPECTOMY;  Surgeon: Malissa Hippo, MD;  [facility];  Service: Endoscopy;;  cecal    POLYPECTOMY N/A 09/26/2019   Procedure: DUODENAL POLYPECTOMY;  Surgeon: Malissa Hippo, MD;  [facility];  Service: Endoscopy;  Laterality: N/A;   POLYPECTOMY   08/11/2022   Procedure: POLYPECTOMY;  Surgeon: Beverley Fiedler, MD;  Location: [facility] ENDOSCOPY;  Service: Gastroenterology;;   REVISION OF ARTERIOVENOUS GORETEX GRAFT N/A 02/24/2017   Procedure: REVISION OF ARTERIOVENOUS GORETEX GRAFT (RESECTION);  Surgeon: Renford Dills, MD;  Location: [facility] ORS;  Service: Vascular;  Laterality: N/A;   REVISON OF ARTERIOVENOUS FISTULA Left 06/19/2020   Procedure: REVISION OF LEFT UPPER ARM AV GRAFT WITH INTERPOSITION JUMP GRAFT USING GORE LIMB;  Surgeon: Cephus Shelling, MD;  Location: [facility] OR;  Service: Vascular;  Laterality: Left;   SHUNTOGRAM N/A 10/15/2013   Procedure: Fistulogram;  Surgeon: Nada Libman, MD;  Location: [facility] CATH LAB;  Service: Cardiovascular;  Laterality: N/A;   SUBMUCOSAL TATTOO INJECTION  11/01/2022   Procedure: SUBMUCOSAL TATTOO INJECTION;  Surgeon: Dolores Frame, MD;  [facility];  Service: Gastroenterology;;   THROMBECTOMY / ARTERIOVENOUS GRAFT REVISION  2011   left upper arm   TUBAL LIGATION  1980's   UPPER EXTREMITY ANGIOGRAPHY Bilateral 12/06/2016   Procedure: Upper Extremity Angiography;  Surgeon: Renford Dills, MD;  Location: [facility] INVASIVE CV LAB;  Service: Cardiovascular;  Laterality: Bilateral;   UPPER EXTREMITY INTERVENTION Left 06/06/2017   Procedure: UPPER EXTREMITY INTERVENTION;  Surgeon: Renford Dills, MD;  Location: [facility] INVASIVE CV LAB;  Service: Cardiovascular;  Laterality: Left;   UPPER EXTREMITY VENOGRAPHY Left 10/04/2022   Procedure: UPPER EXTREMITY VENOGRAPHY;  Surgeon: Renford Dills, MD;  Location: [facility] INVASIVE CV LAB;  Service: Cardiovascular;  Laterality: Left;    Past Medical History:  Diagnosis Date   Acute on chronic respiratory failure with hypoxia (HCC) 10/10/2016   Anxiety    Arthritis    AVM (arteriovenous malformation) of colon    CAD (coronary artery disease)    a. s/p CABG in 2013 b. DES to D1 in 10/2016. c. cath in 07/2018 showing patent grafts with  occlusion of D1 at prior stent site and progression of PDA disease --> medical management recommended   Carotid artery disease (HCC)    a. 60-79% LICA, 03/2012    Chronic bronchitis (HCC)    Chronic HFrEF (heart failure with reduced ejection fraction) (HCC)    Colon cancer (HCC) 1992   Esophageal stricture    ESRD on hemodialysis (HCC)    ESRD due to HTN, started dialysis 2011 and gets HD at [facility] with Dr Fausto Skillern on  MWF schedule.  Access is LUA AVF as of Sept 2014.    GERD (gastroesophageal reflux disease)    High cholesterol 12/2011   History of blood transfusion 07/2011; 12/2011; 01/2012 X 2; 04/2012   History of gout    History of lower GI bleeding    Hypertension    Iron deficiency anemia    Jugular vein occlusion, right (HCC)    Mitral regurgitation    a. Moderate by echo, 02/2012   Mitral valve disease    NSVT (nonsustained ventricular tachycardia) (HCC)    Ovarian cancer (HCC) 1992   PAF (paroxysmal atrial fibrillation) (HCC)    Pneumonia ~ 2009   PUD (peptic ulcer disease)    TIA (transient ischemic attack)    Tricuspid valve disease     Medications:  I have reviewed the patient's current medications.  Medications Prior to Admission  Medication Sig Dispense Refill   fluticasone (FLONASE) 50 MCG/ACT nasal spray Place 2 sprays into both nostrils daily.     calcitRIOL (ROCALTROL) 0.25 MCG capsule Take 7 capsules (1.75 mcg total) by mouth every Monday, Wednesday, and Friday with hemodialysis.     cinacalcet (SENSIPAR) 30 MG tablet Take 1 tablet (30 mg total) by mouth every Monday, Wednesday, and Friday with hemodialysis. 60 tablet    cyanocobalamin (VITAMIN B12) 1000 MCG/ML injection Inject 1,000 mcg into the muscle every 30 (thirty) days.     famotidine (PEPCID) 20 MG tablet Take 1 tablet (20 mg total) by mouth daily as needed for heartburn or indigestion.     folic acid (FOLVITE) 1 MG tablet Take 1 mg by mouth daily.     hydrALAZINE (APRESOLINE) 25 MG tablet Take 1  tablet (25 mg total) by mouth 2 (two) times daily. Hold for sbp <110     hydrOXYzine (ATARAX) 25 MG tablet Take 25 mg by mouth every 6 (six) hours as needed for itching.     levETIRAcetam (KEPPRA) 500 MG tablet Take 1 tablet (500 mg total) by mouth daily. 30 tablet 0   levothyroxine (SYNTHROID) 50 MCG tablet Take 50 mcg by mouth daily.     multivitamin (RENA-VIT) TABS tablet Take 1 tablet by mouth daily.     nitroGLYCERIN (NITROSTAT) 0.4 MG SL tablet Place 0.4 mg under the tongue every 5 (five) minutes as needed for chest pain.     octreotide (SANDOSTATIN LAR) 20 MG injection Inject 20 mg into the muscle every 28 (twenty-eight) days. 1 each 0   ondansetron (ZOFRAN-ODT) 4 MG disintegrating tablet Take 4 mg by mouth every 8 (eight) hours as needed for nausea or vomiting.     rosuvastatin (CRESTOR) 10 MG tablet Take 1 tablet by mouth once daily 90 tablet 0    ALLERGIES:   Allergies  Allergen Reactions   Amlodipine Swelling   Aspirin Other (See Comments)    High Doses Mess up her stomach; "makes my bowels have blood in them". Takes 81 mg EC Aspirin    Nitrofurantoin Hives   Ranexa [Ranolazine] Other (See Comments)    Myoclonus-hospitalized    Bactrim [Sulfamethoxazole-Trimethoprim] Rash   Iodinated Contrast Media Itching and Other (See Comments)   Iron Itching and Other (See Comments)    "they gave me iron in dialysis; had to give me Benadryl cause I had to have the iron" (05/02/2012)   Tylenol [Acetaminophen] Itching and Other (See Comments)    Makes her feet on fire per pt - can tolerate per pt   Gabapentin Other (See Comments)  Unknown reaction   Sucroferric Oxyhydroxide Other (See Comments)    Unknown   Levaquin [Levofloxacin In D5w] Rash   Levofloxacin Rash and Other (See Comments)   Plavix [Clopidogrel Bisulfate] Rash   Protonix [Pantoprazole Sodium] Rash   Venofer [Ferric Oxide] Itching and Other (See Comments)    Patient reports using Benadryl prior to doses as [facility]    FAM HX: Family History  Problem Relation Age of Onset   Heart disease Mother        Heart Disease before age 65   Hyperlipidemia Mother    Hypertension Mother    Diabetes Mother    Heart attack Mother    Heart disease Father        Heart Disease before age 71   Hyperlipidemia Father    Hypertension Father    Diabetes Father    Diabetes Sister    Hypertension Sister    Diabetes Brother    Hyperlipidemia Brother    Heart attack Brother    Hypertension Sister    Heart attack Brother    Colon cancer Child 16   Other Other        noncontributory for early CAD   Esophageal cancer Neg Hx    Liver disease Neg Hx    Kidney disease Neg Hx    Colon polyps Neg Hx     Social History:   reports that she has never smoked. She has never used smokeless tobacco. She reports that she does not drink alcohol and does not use drugs.  ROS: 12 system ROS neg except per HPI above  Blood pressure (!) 101/55, pulse 95, temperature 97.9 F (36.6 C), temperature source Oral, resp. rate 19, SpO2 100%. PHYSICAL EXAM: General: no acute distress, comfortable appearing HEENT: anicteric sclera, MMM CV: normal rate, no murmurs, no edema b/l LEs Lungs: normal wob, clear Abd: soft, non-tender, non-distended Skin: no visible lesions or rashes Neuro: normal speech, no gross focal deficits  Extr: no edema Dialysis access: left IJ [facility] c/d/i   Results for orders placed or performed during the hospital encounter of 10/03/23 (from the past 48 hours)  Comprehensive metabolic panel     Status: Abnormal   Collection Time: 10/03/23  8:35 PM  Result Value Ref Range   Sodium 140 135 - 145 mmol/L   Potassium 3.2 (L) 3.5 - 5.1 mmol/L   Chloride 102 98 - 111 mmol/L   CO2 29 22 - 32 mmol/L   Glucose, Bld 154 (H) 70 - 99 mg/dL    Comment: Glucose reference range applies only to samples taken after fasting for at least 8 hours.   BUN 21 8 - 23 mg/dL   Creatinine, Ser 1.30 (H) 0.44 - 1.00 mg/dL    Calcium 8.5 (L) 8.9 - 10.3 mg/dL   Total Protein 6.9 6.5 - 8.1 g/dL   Albumin 3.0 (L) 3.5 - 5.0 g/dL   AST 32 15 - 41 U/L   ALT 20 0 - 44 U/L   Alkaline Phosphatase 182 (H) 38 - 126 U/L   Total Bilirubin 0.9 0.0 - 1.2 mg/dL   GFR, Estimated 11 (L) >60 mL/min    Comment: (NOTE) Calculated using the CKD-EPI Creatinine Equation (2021)    Anion gap 9 5 - 15    Comment: Performed at [facility], [address] [place], [place] [address]  CBC     Status: Abnormal   Collection Time: 10/03/23  8:35 PM  Result Value Ref Range  WBC 4.3 4.0 - 10.5 K/uL    Comment: REPEATED TO VERIFY WHITE COUNT CONFIRMED ON SMEAR    RBC 2.46 (L) 3.87 - 5.11 MIL/uL   Hemoglobin 8.3 (L) 12.0 - 15.0 g/dL   HCT 16.1 (L) 09.6 - 04.5 %   MCV 107.3 (H) 80.0 - 100.0 fL   MCH 33.7 26.0 - 34.0 pg   MCHC 31.4 30.0 - 36.0 g/dL   RDW 40.9 (H) 81.1 - 91.4 %   Platelets 168 150 - 400 K/uL    Comment: SPECIMEN CHECKED FOR CLOTS REPEATED TO VERIFY PLATELET COUNT CONFIRMED BY SMEAR    nRBC 0.0 0.0 - 0.2 %    Comment: Performed at [facility], [address] [place], [place] [address]  Type and screen [facility]     Status: None   Collection Time: 10/03/23  8:35 PM  Result Value Ref Range   ABO/RH(D) O POS    Antibody Screen NEG    Sample Expiration      10/06/2023,2359 Performed at [facility], [address], [place], [place] [address]    *Note: Due to a large number of results and/or encounters for the requested time period, some results have not been displayed. A complete set of results can be found in Results Review.    No results found.  Assessment/Plan  ESRD  -outpatient HD orders: Katheran James MWF [phone]  4 hours.  EDW 54 kg.  TDC.  Nepro 17 H Elisio.  Flow rates: 300/500.  2K/2.5 calcium.  Heparin: Only heparin locks for TDC.  Meds: Calcitriol 1.5 mcg 3 times a week, ID PN, Mircera 200 mcg every 2 weeks - last given 3/24, sensipar 30 -HD today, heparin free except cath lock   GI  bleed -GI consulted, serial H/H, she's currently NPO   Chronic combined systolic and diastolic CHF exacerbation -UF as tolerated with HD, looking fairly euvolemic today  Hypokalemia -4K dialyste   Volume/ hypertension  -UF as tolerated   Anemia of Chronic Kidney Disease, ABLA -Hgb 8.3 - serial H/H per primary -had mircera 3/24, not due for 2 weeks -Transfuse PRN for Hgb <7   Secondary Hyperparathyroidism/Hyperphosphatemia - resume home meds, calctriol resumed, monitor phos    Paroxysmal Afib -not on a/c given history of anemia/GIB -per primary  Will follow, message with concerns.  Tyler Pita 10/04/2023, 8:44 AM

## 2023-10-04 NOTE — Hospital Course (Addendum)
 Shawna [name] is a 83 y.o. female with medical history significant of ESRD w hemodialysis (MWF), CAD status post CABG, HFrEF, T2DM, HTN, GERD, atrial fibrillation (not on anticoagulants) who presents to the emergency department due to bright red rectal bleeding which started today.  She has a history of recurrent GI bleed.  Last dialysis session was yesterday.   ED Course: Hemodynamically stable.  Workup in the ED showed Hgb 8.3  BMP was normal except for potassium of 3.2, blood glucose 154, creatinine 3.95, albumin 3.0 crocytic anemia Gastroenterologist (Dr. Levon Hedger) was consulted and will follow-up with patient in the morning per EDP.     Assessment & Plan:   Active Problems:   Macrocytic anemia   End-stage renal disease on hemodialysis [facility])   Essential hypertension   Paroxysmal atrial fibrillation (HCC)   Acquired hypothyroidism   Mixed hyperlipidemia   Chronic combined systolic and diastolic CHF (congestive heart failure) (HCC)   Hypoalbuminemia due to protein-calorie malnutrition (HCC)   Hypokalemia   GI bleed    Acute on Chronic GI bleed H/H= 8.3/26.4, this was 7.1/22.0 on 09/20/2023 -Hemoccult positive x 2    Latest Ref Rng & Units 10/05/2023    7:30 AM 10/04/2023   11:15 AM 10/03/2023    8:35 PM  CBC  WBC 4.0 - 10.5 K/uL  3.9  4.3   Hemoglobin 12.0 - 15.0 g/dL 7.7  7.4  8.3   Hematocrit 36.0 - 46.0 % 23.8  23.4  26.4   Platelets 150 - 400 K/uL  153  168     Gastroenterologist  consulted-appreciate follow-up -No plan for further investigation or endoscopy Discussed with nephrology, patient will benefit from 1U PRBC transfusion today 10/05/2023   Macrocytic Anemia Likely anemia of chronic disease due to CKD / ESRD  MCV 107.3, hemoglobin 8.3 Total iron level 121, folate greater than 40, B12 1519, all within normal limits Vitamin B12 and folate    Hypoalbuminemia possibly due to moderate protein calorie malnutrition Albumin 3.0, consider protein supplement when  patient resumes oral intake   Hypokalemia K+ 3.2, this will be replenished   ESRD on HD (MWF) Last dialysis last (3/24), hemodialysis 10/04/2023 Continue [facility] following closely   Chronic combined systolic and diastolic CHF Echocardiogram from 08/22/23 with EF of 45 to 50% with grade 2 diastolic dysfunction  Manage volume with HD.     Essential hypertension Continue home hydralazine  Stable  Paroxysmal A-fib Rate control meds were held during last admission due to symptomatic bradycardia Patient is not on anticoagulation due to history of GI bleed in the past and symptomatic anemia requiring multiple transfusions.    Mixed hyperlipidemia Continue Crestor -Stable   Acquired hypothyroidism Continue Synthroid  -Remained stable

## 2023-10-04 NOTE — Progress Notes (Signed)
 Per the lab this a.m. they attempted to draw patient's blood and was unsuccessful.  Patient is a difficult stick.  Patient told them that when she went to dialysis today she would let them stick her for her blood there.  The patient came up from the ED w/out an IV.  This RN looked for a possible area to stick for an IV w/out success.  Patient stated to me that she is a hard stick and the last IV she had placed was in her neck.

## 2023-10-04 NOTE — Progress Notes (Signed)
 Nurse at bedside,patient returning to floor from dialysis.No c/o pain or discomfort noted. Patient stated" I haven't eat since yesterday". Plan of care on going.Marland Kitchen

## 2023-10-04 NOTE — Progress Notes (Signed)
 Nurse at bedside this am ,patient alert to self only, confused to time,place and situation.Blood pressure 101/55,heart rate 95.Dr Flossie Dibble notified,holding the Hydralazine this am.No c/o pain,patient did state" I feel so cold in here,do you all have the heat off". Warming blanket provided,heat turned up in room. Very pleasant lady.Plan of care on going.

## 2023-10-04 NOTE — Progress Notes (Signed)
 Pt completed HD tx without issue.  10/04/23 1440  Vitals  Temp 98 F (36.7 C)  Temp Source Oral  BP 138/71  BP Location Left Arm  BP Method Automatic  Patient Position (if appropriate) Lying  Pulse Rate 80  Resp 20  Oxygen Therapy  SpO2 100 %  O2 Device Room Air  During Treatment Monitoring  Intra-Hemodialysis Comments Tx completed  Post Treatment  Dialyzer Clearance Lightly streaked  Hemodialysis Intake (mL) 0 mL  Liters Processed 70  Fluid Removed (mL) 1500 mL  Tolerated HD Treatment Yes  Post-Hemodialysis Comments Pt goal met.

## 2023-10-04 NOTE — Plan of Care (Signed)

## 2023-10-04 NOTE — Progress Notes (Signed)
 PROGRESS NOTE    Patient: Shawna [name]                            PCP: [facility]                    DOB: Nov 24, 1940            DOA: 10/03/2023 [id number]             DOS: 10/04/2023, 11:18 AM   LOS: 0 days   Date of Service: The patient was seen and examined on 10/04/2023  Subjective:   The patient was seen and examined this morning. Hemodynamically stable. No issues overnight .  Brief Narrative:   Shawna [name] is a 83 y.o. female with medical history significant of ESRD w hemodialysis (MWF), CAD status post CABG, HFrEF, T2DM, HTN, GERD, atrial fibrillation (not on anticoagulants) who presents to the emergency department due to bright red rectal bleeding which started today.  She has a history of recurrent GI bleed.  Last dialysis session was yesterday.   ED Course: Hemodynamically stable.  Workup in the ED showed Hgb 8.3  BMP was normal except for potassium of 3.2, blood glucose 154, creatinine 3.95, albumin 3.0 crocytic anemia Gastroenterologist (Dr. Levon Hedger) was consulted and will follow-up with patient in the morning per EDP.     Assessment & Plan:   Active Problems:   Macrocytic anemia   End-stage renal disease on hemodialysis [facility])   Essential hypertension   Paroxysmal atrial fibrillation (HCC)   Acquired hypothyroidism   Mixed hyperlipidemia   Chronic combined systolic and diastolic CHF (congestive heart failure) (HCC)   Hypoalbuminemia due to protein-calorie malnutrition (HCC)   Hypokalemia   GI bleed    Acute on Chronic GI bleed H/H= 8.3/26.4, this was 7.1/22.0 on 09/20/2023 -Hemoccult positive x 2    Latest Ref Rng & Units 10/03/2023    8:35 PM 09/20/2023   12:42 PM 09/16/2023    5:13 PM  CBC  WBC 4.0 - 10.5 K/uL 4.3  4.3  4.3   Hemoglobin 12.0 - 15.0 g/dL 8.3  7.1  7.4   Hematocrit 36.0 - 46.0 % 26.4  22.0  23.4   Platelets 150 - 400 K/uL 168  179  174     Gastroenterologist  consulted-appreciate follow-up -NPO  -IVF   Macrocytic  Anemia MCV 107.3, hemoglobin 8.3 Vitamin B12 and folate    Hypoalbuminemia possibly due to moderate protein calorie malnutrition Albumin 3.0, consider protein supplement when patient resumes oral intake   Hypokalemia K+ 3.2, this will be replenished   ESRD on HD (MWF) Last dialysis last (3/24) Continue [facility] consulted for maintenance dialysis   Chronic combined systolic and diastolic CHF Echocardiogram from 08/22/23 with EF of 45 to 50% with grade 2 diastolic dysfunction  Manage volume with HD.     Essential hypertension Continue home hydralazine  Stable  Paroxysmal A-fib Rate control meds were held during last admission due to symptomatic bradycardia Patient is not on anticoagulation due to history of GI bleed in the past and symptomatic anemia requiring multiple transfusions.    Mixed hyperlipidemia Continue Crestor -Stable   Acquired hypothyroidism Continue Synthroid  -Remained stable        ------------------------------------------------------------------------------------------------------------------------------------------------  DVT prophylaxis:  SCDs Start: 10/04/23 0014   Code Status:   Code Status: Full Code  Family Communication: No family member present at bedside- -Advance care planning has  been discussed.   Admission status:   Status is: Observation The patient remains OBS appropriate and will d/c before 2 midnights.   Disposition: From  - home             Planning for discharge in 1-2 days: to   Procedures:   No admission procedures for hospital encounter.   Antimicrobials:  Anti-infectives (From admission, onward)    None        Medication:   Chlorhexidine Gluconate Cloth  6 each Topical Q0600   Chlorhexidine Gluconate Cloth  6 each Topical Q0600   cinacalcet  30 mg Oral Q M,W,F-HD   hydrALAZINE  25 mg Oral BID   levothyroxine  50 mcg Oral Daily   rosuvastatin  10 mg Oral Daily    ondansetron **OR**  ondansetron (ZOFRAN) IV   Objective:   Vitals:   10/04/23 0014 10/04/23 0332 10/04/23 0829 10/04/23 1000  BP:  (!) 144/68 (!) 101/55   Pulse:  79 95   Resp:  (!) 22 19   Temp:  97.9 F (36.6 C)  98.5 F (36.9 C)  TempSrc:  Oral  Oral  SpO2: 100% 97% 100%    No intake or output data in the 24 hours ending 10/04/23 1118 There were no vitals filed for this visit.   Physical examination:   Constitution:  Alert, cooperative, no distress,  Appears calm and comfortable  Psychiatric:   Normal and stable mood and affect, cognition intact,   HEENT:        Normocephalic, PERRL, otherwise with in Normal limits  Chest:         Chest symmetric Cardio vascular:  S1/S2, RRR, No murmure, No Rubs or Gallops  pulmonary: Clear to auscultation bilaterally, respirations unlabored, negative wheezes / crackles Abdomen: Soft, non-tender, non-distended, bowel sounds,no masses, no organomegaly Muscular skeletal: Limited exam - in bed, able to move all 4 extremities,   Neuro: CNII-XII intact. , normal motor and sensation, reflexes intact  Extremities: No pitting edema lower extremities, +2 pulses  Skin: Dry, warm to touch, negative for any Rashes, No open wounds Wounds: per nursing documentation   ------------------------------------------------------------------------------------------------------------------------------------------    LABs:     Latest Ref Rng & Units 10/03/2023    8:35 PM 09/20/2023   12:42 PM 09/16/2023    5:13 PM  CBC  WBC 4.0 - 10.5 K/uL 4.3  4.3  4.3   Hemoglobin 12.0 - 15.0 g/dL 8.3  7.1  7.4   Hematocrit 36.0 - 46.0 % 26.4  22.0  23.4   Platelets 150 - 400 K/uL 168  179  174       Latest Ref Rng & Units 10/03/2023    8:35 PM 09/20/2023   12:42 PM 09/16/2023    5:13 PM  CMP  Glucose 70 - 99 mg/dL 914  782  956   BUN 8 - 23 mg/dL 21  33  23   Creatinine 0.44 - 1.00 mg/dL 2.13  0.86  5.78   Sodium 135 - 145 mmol/L 140  137  134   Potassium 3.5 - 5.1 mmol/L 3.2  3.1  3.3    Chloride 98 - 111 mmol/L 102  100  97   CO2 22 - 32 mmol/L 29  24  24    Calcium 8.9 - 10.3 mg/dL 8.5  8.1  8.5   Total Protein 6.5 - 8.1 g/dL 6.9  6.6  6.5   Total Bilirubin 0.0 - 1.2 mg/dL 0.9  0.8  0.7  Alkaline Phos 38 - 126 U/L 182  153  160   AST 15 - 41 U/L 32  24  23   ALT 0 - 44 U/L 20  13  14         Micro Results No results found for this or any previous visit (from the past 240 hours).  Radiology Reports No results found.  SIGNED: Kendell Bane, MD, FHM. FAAFP. [facility] - [facility] Time spent - 55 min.  In seeing, evaluating and examining the patient. Reviewing medical records, labs, drawn plan of care. [facility],  Pager (please use [email or web address] to page/ text) Please use Epic Secure Chat for non-urgent communication (7AM-7PM)  If 7PM-7AM, please contact night-coverage [email or web address], 10/04/2023, 11:18 AM

## 2023-10-04 NOTE — Consult Note (Addendum)
 Gastroenterology Consult   Referring Provider: No ref. provider found Primary Care Physician:  [facility] Primary Gastroenterologist:  Dr. Levon Hedger   Patient ID: Shawna [name]; [id number]; 16-Sep-1940   Admit date: 10/03/2023  LOS: 0 days   Date of Consultation: 10/04/2023  Reason for Consultation:  GI bleed   History of Present Illness   Shawna [name] is a 83 y.o. year old female with history of dementia, ESRD on hemodialysis, A-fib on aspirin, CAD, CHF, recurrent gastric and small bowel AVMs, GERD, HTN, colon cancer s/p resection in 1992, duodenal adenoma who presented to the ED with c/o rectal bleeding. Hgb 8.3, actually up from 7.1 two weeks ago. GI consulted for further evaluation.  History: seen recently in the ED on 3/12 for reports of low hemoglobin (7.1 at that time). She was asymptomatic, therefore transfused 1 unit and discharged.   Patient is well known to our GI service, has has recurrent anemia, likely multifactorial in setting of known AVMs, end-stage renal disease, her AVMs have been difficult to treat as they have recurred multiple times despite ablation.  She was recommended in July 2024 to be started on octreotide monthly injections to help with bleeding prophylaxis. She was started on octreotide 20mg  monthly injections in November   Consult: Patient states she is feeling very good. She reports 1 episode of rectal bleeding on Monday. She denies any constipation, diarrhea, abdominal pain, nausea or vomiting. No black stools. States she has been feeling good overall. Denies fatigue, dizziness, has had some mild SOB but this seems to be improving. Appetite has been very good. She states she does not think she is getting octreotide, she actually endorses no knowledge of what this medication is.   I attempted to reach out to the patient's husband [name]) to obtain more information regarding the patients history and to determine if she is in fact on  octreotide though I was unable to reach him or leave a message.  small bowel endoscopy) 11/2022- Esophagogastric landmarks identified. - 2 cm hiatal hernia. - A few non-bleeding angiodysplastic lesions in the stomach. Treated with argon plasma coagulation (APC). - Normal stomach otherwise - Normal examined duodenum. - A tattoo was seen in the jejunum with a small  polyp. - Jejunal diverticulum - several, one large. - Six non-bleeding angiodysplastic lesions in the proximal jejunum beyond the tattoo. Treated with argon plasma coagulation (APC). AVMs are the likely cause of bleeding / anemia for this patient with ESRD. Ablated. NO active bleeding  Last Colonoscopy: 08/2022 preparation was fair patent end-to-end ileocolonic and end-to-side colocolonic anastomosis, characterized by healthy-appearing mucosa Examined portion of the neoterminal ileum was normal two 5 to 6 mm polyps in transverse colon three 4-7 mm polyps in descending colon One 9 mm meter polyp in sigmoid colon Moderate diverticulosis in sigmoid colon and descending colo  Past Medical History:  Diagnosis Date   Acute on chronic respiratory failure with hypoxia (HCC) 10/10/2016   Anxiety    Arthritis    AVM (arteriovenous malformation) of colon    CAD (coronary artery disease)    a. s/p CABG in 2013 b. DES to D1 in 10/2016. c. cath in 07/2018 showing patent grafts with occlusion of D1 at prior stent site and progression of PDA disease --> medical management recommended   Carotid artery disease (HCC)    a. 60-79% LICA, 03/2012    Chronic bronchitis (HCC)    Chronic HFrEF (heart failure with reduced ejection fraction) (HCC)  Colon cancer (HCC) 1992   Esophageal stricture    ESRD on hemodialysis (HCC)    ESRD due to HTN, started dialysis 2011 and gets HD at [facility] with Dr Fausto Skillern on MWF schedule.  Access is LUA AVF as of Sept 2014.    GERD (gastroesophageal reflux disease)    High cholesterol 12/2011   History of blood  transfusion 07/2011; 12/2011; 01/2012 X 2; 04/2012   History of gout    History of lower GI bleeding    Hypertension    Iron deficiency anemia    Jugular vein occlusion, right (HCC)    Mitral regurgitation    a. Moderate by echo, 02/2012   Mitral valve disease    NSVT (nonsustained ventricular tachycardia) (HCC)    Ovarian cancer (HCC) 1992   PAF (paroxysmal atrial fibrillation) (HCC)    Pneumonia ~ 2009   PUD (peptic ulcer disease)    TIA (transient ischemic attack)    Tricuspid valve disease     Past Surgical History:  Procedure Laterality Date   A/V FISTULAGRAM Left 10/04/2022   Procedure: A/V Fistulagram;  Surgeon: Renford Dills, MD;  Location: [facility] INVASIVE CV LAB;  Service: Cardiovascular;  Laterality: Left;   A/V SHUNTOGRAM Left 03/19/2019   Procedure: A/V SHUNTOGRAM;  Surgeon: Renford Dills, MD;  Location: [facility] INVASIVE CV LAB;  Service: Cardiovascular;  Laterality: Left;   ABDOMINAL HYSTERECTOMY  1992   APPENDECTOMY  06/1990   AV FISTULA PLACEMENT  07/2009   left upper arm   AV FISTULA PLACEMENT Right 09/06/2016   Procedure: RIGHT FOREARM ARTERIOVENOUS (AV) GRAFT;  Surgeon: Sherren Kerns, MD;  [facility]: [facility] OR;  Service: Vascular;  Laterality: Right;   AV FISTULA PLACEMENT N/A 02/24/2017   Procedure: INSERTION OF ARTERIOVENOUS (AV) GORE-TEX GRAFT ARM (BRACHIAL AXILLARY);  Surgeon: Renford Dills, MD;  Location: [facility] ORS;  Service: Vascular;  Laterality: N/A;   AVGG REMOVAL Right 09/06/2016   Procedure: REMOVAL OF Right Arm ARTERIOVENOUS GORETEX GRAFT and Vein Patch angioplasty of brachial artery;  Surgeon: Chuck Hint, MD;  Location: [facility] OR;  Service: Vascular;  Laterality: Right;   BIOPSY  09/26/2019   Procedure: BIOPSY;  Surgeon: Malissa Hippo, MD;  [facility];  Service: Endoscopy;;   BIOPSY  11/01/2022   Procedure: BIOPSY;  Surgeon: Dolores Frame, MD;  [facility];  Service: Gastroenterology;;   COLON RESECTION   1992   COLON SURGERY     COLONOSCOPY N/A 03/09/2019   Procedure: COLONOSCOPY;  Surgeon: Malissa Hippo, MD;  [facility];  Service: Endoscopy;  Laterality: N/A;   COLONOSCOPY N/A 08/11/2022   Procedure: COLONOSCOPY;  Surgeon: Beverley Fiedler, MD;  Location: [facility] ENDOSCOPY;  Service: Gastroenterology;  Laterality: N/A;   COLONOSCOPY WITH PROPOFOL N/A 06/08/2021   Procedure: COLONOSCOPY WITH PROPOFOL;  Surgeon: Dolores Frame, MD;  [facility];  Service: Gastroenterology;  Laterality: N/A;  9:05 /Patient is on dialysis Mon Wed Fri   CORONARY ANGIOPLASTY WITH STENT PLACEMENT  12/15/11   "2"   CORONARY ANGIOPLASTY WITH STENT PLACEMENT  y/2013   "1; makes total of 3" (05/02/2012)   CORONARY ARTERY BYPASS GRAFT  06/13/2012   Procedure: CORONARY ARTERY BYPASS GRAFTING (CABG);  Surgeon: Delight Ovens, MD;  Location: [facility] OR;  Service: Open Heart Surgery;  Laterality: N/A;  cabg x four;  using left internal mammary artery, and left leg greater saphenous vein harvested endoscopically   CORONARY STENT INTERVENTION  N/A 10/13/2016   Procedure: Coronary Stent Intervention;  Surgeon: Lennette Bihari, MD;  Location: [facility] INVASIVE CV LAB;  Service: Cardiovascular;  Laterality: N/A;   DIALYSIS/PERMA CATHETER REMOVAL N/A 04/18/2017   Procedure: DIALYSIS/PERMA CATHETER REMOVAL;  Surgeon: Renford Dills, MD;  Location: [facility] INVASIVE CV LAB;  Service: Cardiovascular;  Laterality: N/A;   DILATION AND CURETTAGE OF UTERUS     ENTEROSCOPY N/A 06/08/2021   Procedure: PUSH ENTEROSCOPY;  Surgeon: Dolores Frame, MD;  [facility];  Service: Gastroenterology;  Laterality: N/A;   ENTEROSCOPY N/A 11/01/2022   Procedure: ENTEROSCOPY;  Surgeon: Dolores Frame, MD;  [facility];  Service: Gastroenterology;  Laterality: N/A;   ENTEROSCOPY N/A 12/01/2022   Procedure: ENTEROSCOPY;  Surgeon: Benancio Deeds, MD;  Location: [facility] ENDOSCOPY;  Service:  Gastroenterology;  Laterality: N/A;   ESOPHAGOGASTRODUODENOSCOPY  01/20/2012   Procedure: ESOPHAGOGASTRODUODENOSCOPY (EGD);  Surgeon: Meryl Dare, MD,FACG;  Location: [facility] ENDOSCOPY;  Service: Endoscopy;  Laterality: N/A;   ESOPHAGOGASTRODUODENOSCOPY N/A 03/26/2013   Procedure: ESOPHAGOGASTRODUODENOSCOPY (EGD);  Surgeon: Hilarie Fredrickson, MD;  Location: [facility] ENDOSCOPY;  Service: Endoscopy;  Laterality: N/A;   ESOPHAGOGASTRODUODENOSCOPY N/A 04/30/2015   Procedure: ESOPHAGOGASTRODUODENOSCOPY (EGD);  Surgeon: Malissa Hippo, MD;  [facility];  Service: Endoscopy;  Laterality: N/A;  1pm - moved to 10/20 @ 1:10   ESOPHAGOGASTRODUODENOSCOPY N/A 07/29/2016   Procedure: ESOPHAGOGASTRODUODENOSCOPY (EGD);  Surgeon: Ruffin Frederick, MD;  Location: [facility] ENDOSCOPY;  Service: Gastroenterology;  Laterality: N/A;  enteroscopy   ESOPHAGOGASTRODUODENOSCOPY N/A 09/26/2019   Procedure: ESOPHAGOGASTRODUODENOSCOPY (EGD);  Surgeon: Malissa Hippo, MD;  [facility];  Service: Endoscopy;  Laterality: N/A;  1250   ESOPHAGOGASTRODUODENOSCOPY N/A 08/11/2022   Procedure: ESOPHAGOGASTRODUODENOSCOPY (EGD);  Surgeon: Beverley Fiedler, MD;  Location: [facility] ENDOSCOPY;  Service: Gastroenterology;  Laterality: N/A;   ESOPHAGOGASTRODUODENOSCOPY (EGD) WITH PROPOFOL N/A 02/05/2021   Procedure: ESOPHAGOGASTRODUODENOSCOPY (EGD) WITH PROPOFOL;  Surgeon: Lanelle Bal, DO;  [facility];  Service: Endoscopy;  Laterality: N/A;   ESOPHAGOGASTRODUODENOSCOPY (EGD) WITH PROPOFOL N/A 08/27/2022   Procedure: ESOPHAGOGASTRODUODENOSCOPY (EGD) WITH PROPOFOL;  Surgeon: Lanelle Bal, DO;  [facility];  Service: Endoscopy;  Laterality: N/A;   GIVENS CAPSULE STUDY N/A 03/07/2019   Procedure: GIVENS CAPSULE STUDY;  Surgeon: Malissa Hippo, MD;  [facility];  Service: Endoscopy;  Laterality: N/A;  7:30   GIVENS CAPSULE STUDY N/A 04/22/2021   Procedure: GIVENS CAPSULE STUDY;  Surgeon: Malissa Hippo, MD;  [facility];  Service: Endoscopy;  Laterality: N/A;  7:30   GIVENS CAPSULE STUDY N/A 08/27/2022   Procedure: GIVENS CAPSULE STUDY;  Surgeon: Lanelle Bal, DO;  [facility];  Service: Endoscopy;  Laterality: N/A;   HOT HEMOSTASIS N/A 08/11/2022   Procedure: HOT HEMOSTASIS (ARGON PLASMA COAGULATION/BICAP);  Surgeon: Beverley Fiedler, MD;  Location: [facility] ENDOSCOPY;  Service: Gastroenterology;  Laterality: N/A;   HOT HEMOSTASIS N/A 12/01/2022   Procedure: HOT HEMOSTASIS (ARGON PLASMA COAGULATION/BICAP);  Surgeon: Benancio Deeds, MD;  Location: [facility] ENDOSCOPY;  Service: Gastroenterology;  Laterality: N/A;   INSERTION OF DIALYSIS CATHETER N/A 10/05/2020   Procedure: ABORTED TUNNELED DIALYSIS CATHETER PLACEMENT RIGHT INTERNAL JUGULAR VEIN ;  Surgeon: Lucretia Roers, MD;  Location: [facility] ORS;  Service: General;  Laterality: N/A;   INTRAOPERATIVE TRANSESOPHAGEAL ECHOCARDIOGRAM  06/13/2012   Procedure: INTRAOPERATIVE TRANSESOPHAGEAL ECHOCARDIOGRAM;  Surgeon: Delight Ovens, MD;  Location: [facility] OR;  Service: Open Heart Surgery;  Laterality: N/A;   IR DIALY SHUNT INTRO NEEDLE/INTRACATH INITIAL W/IMG LEFT Left 10/06/2020   IR FLUORO GUIDE CV LINE RIGHT  06/17/2020   IR FLUORO GUIDE CV LINE RIGHT  11/12/2022   IR GENERIC HISTORICAL  07/26/2016   IR FLUORO GUIDE CV LINE RIGHT 07/26/2016 Berdine Dance, MD [facility]   IR GENERIC HISTORICAL  07/26/2016   IR US GUIDE VASC ACCESS RIGHT 07/26/2016 Berdine Dance, MD [facility]   IR GENERIC HISTORICAL  08/02/2016   IR US GUIDE VASC ACCESS RIGHT 08/02/2016 Berdine Dance, MD [facility]   IR GENERIC HISTORICAL  08/02/2016   IR FLUORO GUIDE CV LINE RIGHT 08/02/2016 Berdine Dance, MD [facility]   IR RADIOLOGY PERIPHERAL GUIDED IV START  03/28/2017   IR REMOVAL TUN CV CATH W/O FL  08/11/2020   IR REMOVAL TUN CV CATH W/O FL  11/15/2022   IR THROMBECTOMY AV FISTULA W/THROMBOLYSIS INC/SHUNT/IMG LEFT Left 06/17/2020   IR US GUIDE VASC  ACCESS LEFT  06/17/2020   IR US GUIDE VASC ACCESS RIGHT  03/28/2017   IR US GUIDE VASC ACCESS RIGHT  06/17/2020   IR US GUIDE VASC ACCESS RIGHT  11/12/2022   LEFT HEART CATH AND CORONARY ANGIOGRAPHY N/A 09/20/2016   Procedure: Left Heart Cath and Coronary Angiography;  Surgeon: Lyn Records, MD;  Location: [facility] INVASIVE CV LAB;  Service: Cardiovascular;  Laterality: N/A;   LEFT HEART CATH AND CORS/GRAFTS ANGIOGRAPHY N/A 10/13/2016   Procedure: Left Heart Cath and Cors/Grafts Angiography;  Surgeon: Lennette Bihari, MD;  Location: [facility] INVASIVE CV LAB;  Service: Cardiovascular;  Laterality: N/A;   LEFT HEART CATH AND CORS/GRAFTS ANGIOGRAPHY N/A 07/13/2018   Procedure: LEFT HEART CATH AND CORS/GRAFTS ANGIOGRAPHY;  Surgeon: Swaziland, Peter M, MD;  Location: [facility] INVASIVE CV LAB;  Service: Cardiovascular;  Laterality: N/A;   LEFT HEART CATH AND CORS/GRAFTS ANGIOGRAPHY N/A 07/22/2021   Procedure: LEFT HEART CATH AND CORS/GRAFTS ANGIOGRAPHY;  Surgeon: Runell Gess, MD;  Location: [facility] INVASIVE CV LAB;  Service: Cardiovascular;  Laterality: N/A;   LEFT HEART CATHETERIZATION WITH CORONARY ANGIOGRAM N/A 12/15/2011   Procedure: LEFT HEART CATHETERIZATION WITH CORONARY ANGIOGRAM;  Surgeon: Kathleene Hazel, MD;  Location: [facility] CATH LAB;  Service: Cardiovascular;  Laterality: N/A;   LEFT HEART CATHETERIZATION WITH CORONARY ANGIOGRAM N/A 01/10/2012   Procedure: LEFT HEART CATHETERIZATION WITH CORONARY ANGIOGRAM;  Surgeon: Peter M Swaziland, MD;  Location: [facility] CATH LAB;  Service: Cardiovascular;  Laterality: N/A;   LEFT HEART CATHETERIZATION WITH CORONARY ANGIOGRAM N/A 06/08/2012   Procedure: LEFT HEART CATHETERIZATION WITH CORONARY ANGIOGRAM;  Surgeon: Kathleene Hazel, MD;  Location: [facility] CATH LAB;  Service: Cardiovascular;  Laterality: N/A;   LEFT HEART CATHETERIZATION WITH CORONARY/GRAFT ANGIOGRAM N/A 12/10/2013   Procedure: LEFT HEART CATHETERIZATION WITH Isabel Caprice;  Surgeon: Corky Crafts, MD;   Location: [facility] CATH LAB;  Service: Cardiovascular;  Laterality: N/A;   OVARY SURGERY     ovarian cancer   POLYPECTOMY  03/09/2019   Procedure: POLYPECTOMY;  Surgeon: Malissa Hippo, MD;  [facility];  Service: Endoscopy;;  cecal    POLYPECTOMY N/A 09/26/2019   Procedure: DUODENAL POLYPECTOMY;  Surgeon: Malissa Hippo, MD;  [facility];  Service: Endoscopy;  Laterality: N/A;   POLYPECTOMY  08/11/2022   Procedure: POLYPECTOMY;  Surgeon: Beverley Fiedler, MD;  Location: [facility] ENDOSCOPY;  Service: Gastroenterology;;   REVISION OF ARTERIOVENOUS GORETEX GRAFT N/A 02/24/2017   Procedure: REVISION OF ARTERIOVENOUS GORETEX GRAFT (RESECTION);  Surgeon:  Schnier, Latina Craver, MD;  Location: [facility] ORS;  Service: Vascular;  Laterality: N/A;   REVISON OF ARTERIOVENOUS FISTULA Left 06/19/2020   Procedure: REVISION OF LEFT UPPER ARM AV GRAFT WITH INTERPOSITION JUMP GRAFT USING GORE LIMB;  Surgeon: Cephus Shelling, MD;  Location: [facility] OR;  Service: Vascular;  Laterality: Left;   SHUNTOGRAM N/A 10/15/2013   Procedure: Fistulogram;  Surgeon: Nada Libman, MD;  Location: [facility] CATH LAB;  Service: Cardiovascular;  Laterality: N/A;   SUBMUCOSAL TATTOO INJECTION  11/01/2022   Procedure: SUBMUCOSAL TATTOO INJECTION;  Surgeon: Dolores Frame, MD;  [facility];  Service: Gastroenterology;;   THROMBECTOMY / ARTERIOVENOUS GRAFT REVISION  2011   left upper arm   TUBAL LIGATION  1980's   UPPER EXTREMITY ANGIOGRAPHY Bilateral 12/06/2016   Procedure: Upper Extremity Angiography;  Surgeon: Renford Dills, MD;  Location: [facility] INVASIVE CV LAB;  Service: Cardiovascular;  Laterality: Bilateral;   UPPER EXTREMITY INTERVENTION Left 06/06/2017   Procedure: UPPER EXTREMITY INTERVENTION;  Surgeon: Renford Dills, MD;  Location: [facility] INVASIVE CV LAB;  Service: Cardiovascular;  Laterality: Left;   UPPER EXTREMITY VENOGRAPHY Left 10/04/2022   Procedure: UPPER EXTREMITY VENOGRAPHY;  Surgeon:  Renford Dills, MD;  Location: [facility] INVASIVE CV LAB;  Service: Cardiovascular;  Laterality: Left;    Prior to Admission medications   Medication Sig Start Date End Date Taking? Authorizing Provider  fluticasone (FLONASE) 50 MCG/ACT nasal spray Place 2 sprays into both nostrils daily. 09/19/23  Yes [provider]  calcitRIOL (ROCALTROL) 0.25 MCG capsule Take 7 capsules (1.75 mcg total) by mouth every Monday, Wednesday, and Friday with hemodialysis. 12/21/22   Lanae Boast, MD  cinacalcet (SENSIPAR) 30 MG tablet Take 1 tablet (30 mg total) by mouth every Monday, Wednesday, and Friday with hemodialysis. 12/19/22   Lanae Boast, MD  cyanocobalamin (VITAMIN B12) 1000 MCG/ML injection Inject 1,000 mcg into the muscle every 30 (thirty) days.    [provider]  famotidine (PEPCID) 20 MG tablet Take 1 tablet (20 mg total) by mouth daily as needed for heartburn or indigestion. 09/10/23 09/09/24  Cleora Fleet, MD  folic acid (FOLVITE) 1 MG tablet Take 1 mg by mouth daily. 05/24/22   [provider]  hydrALAZINE (APRESOLINE) 25 MG tablet Take 1 tablet (25 mg total) by mouth 2 (two) times daily. Hold for sbp <110 12/27/22   Lanae Boast, MD  hydrOXYzine (ATARAX) 25 MG tablet Take 25 mg by mouth every 6 (six) hours as needed for itching. 06/06/22   [provider]  levETIRAcetam (KEPPRA) 500 MG tablet Take 1 tablet (500 mg total) by mouth daily. 11/15/22   Leroy Sea, MD  levothyroxine (SYNTHROID) 50 MCG tablet Take 50 mcg by mouth daily. 08/27/23   [provider]  multivitamin (RENA-VIT) TABS tablet Take 1 tablet by mouth daily.    [provider]  nitroGLYCERIN (NITROSTAT) 0.4 MG SL tablet Place 0.4 mg under the tongue every 5 (five) minutes as needed for chest pain.    [provider]  octreotide (SANDOSTATIN LAR) 20 MG injection Inject 20 mg into the muscle every 28 (twenty-eight) days. 11/15/22   Leroy Sea, MD  ondansetron (ZOFRAN-ODT)  4 MG disintegrating tablet Take 4 mg by mouth every 8 (eight) hours as needed for nausea or vomiting. 05/22/23   [provider]  rosuvastatin (CRESTOR) 10 MG tablet Take 1 tablet by mouth once daily 06/06/22   Iran Ouch Lennart Pall, PA-C    Current  Facility-Administered Medications  Medication Dose Route Frequency Provider Last Rate Last Admin   Chlorhexidine Gluconate Cloth 2 % PADS 6 each  6 each Topical Q0600 Shahmehdi, Seyed A, MD       cinacalcet (SENSIPAR) tablet 30 mg  30 mg Oral Q M,W,F-HD Adefeso, Oladapo, DO       hydrALAZINE (APRESOLINE) tablet 25 mg  25 mg Oral BID Adefeso, Oladapo, DO       levothyroxine (SYNTHROID) tablet 50 mcg  50 mcg Oral Daily Adefeso, Oladapo, DO   50 mcg at 10/04/23 0601   ondansetron (ZOFRAN) tablet 4 mg  4 mg Oral Q6H PRN Adefeso, Oladapo, DO       Or   ondansetron (ZOFRAN) injection 4 mg  4 mg Intravenous Q6H PRN Adefeso, Oladapo, DO       rosuvastatin (CRESTOR) tablet 10 mg  10 mg Oral Daily Adefeso, Oladapo, DO        Allergies as of 10/03/2023 - Review Complete 10/03/2023  Allergen Reaction Noted   Amlodipine Swelling 01/19/2015   Aspirin Other (See Comments) 12/15/2011   Nitrofurantoin Hives 07/08/2009   Ranexa [ranolazine] Other (See Comments) 08/30/2020   Bactrim [sulfamethoxazole-trimethoprim] Rash 12/15/2011   Iodinated contrast media Itching and Other (See Comments) 12/15/2011   Iron Itching and Other (See Comments) 05/02/2012   Tylenol [acetaminophen] Itching and Other (See Comments) 07/08/2016   Gabapentin Other (See Comments) 02/08/2017   Sucroferric oxyhydroxide Other (See Comments) 04/15/2021   Levaquin [levofloxacin in d5w] Rash 10/04/2013   Levofloxacin Rash and Other (See Comments) 04/15/2017   Plavix [clopidogrel bisulfate] Rash 01/05/2012   Protonix [pantoprazole sodium] Rash 03/21/2013   Venofer [ferric oxide] Itching and Other (See Comments) 05/02/2012    Family History  Problem Relation Age of Onset   Heart  disease Mother        Heart Disease before age 76   Hyperlipidemia Mother    Hypertension Mother    Diabetes Mother    Heart attack Mother    Heart disease Father        Heart Disease before age 69   Hyperlipidemia Father    Hypertension Father    Diabetes Father    Diabetes Sister    Hypertension Sister    Diabetes Brother    Hyperlipidemia Brother    Heart attack Brother    Hypertension Sister    Heart attack Brother    Colon cancer Child 82   Other Other        noncontributory for early CAD   Esophageal cancer Neg Hx    Liver disease Neg Hx    Kidney disease Neg Hx    Colon polyps Neg Hx     Social History   Socioeconomic History   Marital status: Married    Spouse name: Sharece Fleischhacker   Number of children: 1   Years of education: Not on file   Highest education level: GED or equivalent  Occupational History   Occupation: retired- Architectural technologist- sewed  Tobacco Use   Smoking status: Never   Smokeless tobacco: Never  Vaping Use   Vaping status: Never Used  Substance and Sexual Activity   Alcohol use: No    Alcohol/week: 0.0 standard drinks of alcohol   Drug use: No   Sexual activity: Not Currently    Birth control/protection: Surgical  Other Topics Concern   Not on file  Social History Narrative   Lives in [place], [place] with husband.  Dialysis pt - [facility].   Social  Drivers of Health   Financial Resource Strain: Low Risk  (12/14/2018)   Overall Financial Resource Strain (CARDIA)    Difficulty of Paying Living Expenses: Not very hard  Food Insecurity: No Food Insecurity (10/04/2023)   Hunger Vital Sign    Worried About Running Out of Food in the Last Year: Never true    Ran Out of Food in the Last Year: Never true  Transportation Needs: No Transportation Needs (10/04/2023)   PRAPARE - Administrator, Civil Service (Medical): No    Lack of Transportation (Non-Medical): No  Physical Activity: Unknown (12/14/2018)   Exercise Vital Sign     Days of Exercise per Week: Patient declined    Minutes of Exercise per Session: Patient declined  Stress: No Stress Concern Present (12/14/2018)   [employer] of Occupational Health - Occupational Stress Questionnaire    Feeling of Stress : Only a little  Social Connections: Patient Declined (09/09/2023)   Social Connection and Isolation Panel [NHANES]    Frequency of Communication with Friends and Family: Patient declined    Frequency of Social Gatherings with Friends and Family: Patient declined    Attends Religious Services: Patient declined    Database administrator or Organizations: Patient declined    Attends Banker Meetings: Patient declined    Marital Status: Patient declined  Intimate Partner Violence: Not At Risk (10/04/2023)   Humiliation, Afraid, Rape, and Kick questionnaire    Fear of Current or Ex-Partner: No    Emotionally Abused: No    Physically Abused: No    Sexually Abused: No     Review of Systems   Gen: Denies any fever, chills, loss of appetite, change in weight or weight loss CV: Denies chest pain, heart palpitations, syncope, edema  Resp: Denies shortness of breath with rest, cough, wheezing, coughing up blood, and pleurisy. GI: denies melena, nausea, vomiting, diarrhea, constipation, dysphagia, odyonophagia, early satiety or weight loss. +one episode of dark red blood per rectum GU : Denies urinary burning, blood in urine, urinary frequency, and urinary incontinence. MS: Denies joint pain, limitation of movement, swelling, cramps, and atrophy.  Derm: Denies rash, itching, dry skin, hives. Psych: Denies depression, anxiety, memory loss, hallucinations, and confusion. Heme: Denies bruising or bleeding Neuro:  Denies any headaches, dizziness, paresthesias, shaking  Physical Exam   Vital Signs in last 24 hours: Temp:  [97.8 F (36.6 C)-98.4 F (36.9 C)] 97.9 F (36.6 C) (03/26 0332) Pulse Rate:  [66-108] 95 (03/26 0829) Resp:  [16-22] 19  (03/26 0829) BP: (101-153)/(55-79) 101/55 (03/26 0829) SpO2:  [92 %-100 %] 100 % (03/26 0829) FiO2 (%):  [21 %] 21 % (03/26 0014)    General:   Alert,  Well-developed, well-nourished, pleasant and cooperative in NAD Head:  Normocephalic and atraumatic. Eyes:  Sclera clear, no icterus.   Conjunctiva pink. Ears:  Normal auditory acuity. Mouth:  No deformity or lesions, dentition normal. Neck:  Supple; no masses Lungs:  Clear throughout to auscultation.   No wheezes, crackles, or rhonchi. No acute distress. Heart:  Regular rate and rhythm; no murmurs, clicks, rubs,  or gallops. Abdomen:  Soft, nontender and nondistended. No masses, hepatosplenomegaly or hernias noted. Normal bowel sounds, without guarding, and without rebound.   Msk:  Symmetrical without gross deformities. Normal posture. Extremities:  Without clubbing or edema. Neurologic:  Alert, normal speech, no focal neuro deficits  Skin:  Intact without significant lesions or rashes. Psych:  Alert and cooperative. Normal mood and affect.  Labs/Studies   Recent Labs Recent Labs    10/03/23 2035  WBC 4.3  HGB 8.3*  HCT 26.4*  PLT 168   BMET Recent Labs    10/03/23 2035  NA 140  K 3.2*  CL 102  CO2 29  GLUCOSE 154*  BUN 21  CREATININE 3.95*  CALCIUM 8.5*   LFT Recent Labs    10/03/23 2035  PROT 6.9  ALBUMIN 3.0*  AST 32  ALT 20  ALKPHOS 182*  BILITOT 0.9     Assessment   Shawna [name] is a 83 y.o. year old female with history of dementia, ESRD on hemodialysis, A-fib on aspirin, CAD, CHF, recurrent gastric and small bowel AVMs, GERD, HTN, colon cancer s/p resection in 1992, duodenal adenoma who presented to the ED with c/o rectal bleeding. Hgb 8.3, actually up from 7.1 two weeks ago. GI consulted for further evaluation.  Anemia/recurrent GI bleeding: patient with reports of one episode of darker red blood mixed in with stool on Monday. She denies any other GI symptoms. No BMs today. She has known  recurrent small bowel and gastric AVMs, currently supposed to be on octreotide injections monthly to help with bleeding prophylaxis though she tells me she is unsure she is actually receiving these. I attempted to reach her husband via telephone to obtain more information regarding octreotide though I was unable to connect with him or leave a message. Hgb was actually improved from two weeks ago with 8.3 on admission, down to 7.4 this morning though iron studies with iron 121, TIBC 131, saturation 92, ferritin was not checked. Folate WNL. The patient states she is feeling very good and wants to go home. She has no GI complaints today. Our office did reach out to patient's PCP [facility] as this is where she was to be receiving in octreotide and were told that she is receiving these injections currently, her last was on 10/02/23 per Dewanna at [facility].   At this time, given no further rectal bleeding and in light of recent upper and lower endoscopic evaluations last year, suspect this is recurrent AVM bleeding. Will continue to follow hemoglobin closely and monitor for any recurrent bleeding. If she were to continue to have a downtrending hemoglobin or recurrent bleeding, will need to consider endoscopic evaluations. She should continue with monthly dosing of her octreotide for now as well.     Plan / Recommendations   PPI BID  Continue octreotide monthly as outpatient Monitor for further overt bleeding Trend h&h closely 5. Consider endoscopic evaluations if she has recurrent bleeding/continued decline in H&H 6. Renal diet today 7. will make NPO at midnight as precautionary in case endoscopic evaluation is needed tomorrow    10/04/2023, 9:41 AM  Chelsea L. Jeanmarie Hubert, MSN, APRN, AGNP-C Adult-Gerontology Nurse Practitioner [name] at [facility]   Attending note: Assessment and plan discussed with Doylene Bode, NP  Further recommendations to follow in the a.m.

## 2023-10-04 NOTE — TOC CM/SW Note (Signed)
 Transition of Care [facility]) - Inpatient Brief Assessment   Patient Details  Name: Shawna [name] MRN: [id number] Date of Birth: [birth year]  Transition of Care [facility]) CM/SW Contact:    Isabella Bowens, LCSWA Phone Number: 10/04/2023, 9:01 AM   Clinical Narrative:  Transition of Care Department [facility]) has reviewed patient and no TOC needs have been identified at this time. We will continue to monitor patient advancement through interdisciplinary progression rounds. If new patient transition needs arise, please place a TOC consult.   Transition of Care Asessment: Insurance and Status: Insurance coverage has been reviewed Patient has primary care physician: Yes Home environment has been reviewed: Single Family Home Prior level of function:: Independent Prior/Current Home Services: No current home services Social Drivers of Health Review: SDOH reviewed no interventions necessary Readmission risk has been reviewed: Yes Transition of care needs: no transition of care needs at this time

## 2023-10-05 DIAGNOSIS — K31819 Angiodysplasia of stomach and duodenum without bleeding: Secondary | ICD-10-CM | POA: Diagnosis not present

## 2023-10-05 DIAGNOSIS — G9340 Encephalopathy, unspecified: Secondary | ICD-10-CM

## 2023-10-05 DIAGNOSIS — D649 Anemia, unspecified: Secondary | ICD-10-CM | POA: Diagnosis not present

## 2023-10-05 DIAGNOSIS — I480 Paroxysmal atrial fibrillation: Secondary | ICD-10-CM | POA: Diagnosis not present

## 2023-10-05 LAB — HEPATITIS B SURFACE ANTIBODY, QUANTITATIVE: Hep B S AB Quant (Post): 257 m[IU]/mL

## 2023-10-05 LAB — MRSA NEXT GEN BY PCR, NASAL: MRSA by PCR Next Gen: NOT DETECTED

## 2023-10-05 LAB — HEMOGLOBIN AND HEMATOCRIT, BLOOD
HCT: 23.8 % — ABNORMAL LOW (ref 36.0–46.0)
HCT: 30.8 % — ABNORMAL LOW (ref 36.0–46.0)
Hemoglobin: 7.7 g/dL — ABNORMAL LOW (ref 12.0–15.0)
Hemoglobin: 9.9 g/dL — ABNORMAL LOW (ref 12.0–15.0)

## 2023-10-05 LAB — GLUCOSE, CAPILLARY: Glucose-Capillary: 155 mg/dL — ABNORMAL HIGH (ref 70–99)

## 2023-10-05 LAB — PREPARE RBC (CROSSMATCH)

## 2023-10-05 LAB — T4, FREE: Free T4: 1.45 ng/dL — ABNORMAL HIGH (ref 0.61–1.12)

## 2023-10-05 MED ORDER — SODIUM CHLORIDE 0.9% IV SOLUTION: Freq: Once | INTRAVENOUS | Status: AC

## 2023-10-05 MED ORDER — MAGNESIUM SULFATE 2 GM/50ML IV SOLN
2.0000 g | Freq: Once | INTRAVENOUS | Status: AC
  Administered 2023-10-05: 2 g via INTRAVENOUS
  Filled 2023-10-05: qty 50

## 2023-10-05 MED ORDER — FUROSEMIDE 10 MG/ML IJ SOLN
20.0000 mg | Freq: Two times a day (BID) | INTRAMUSCULAR | Status: AC
  Administered 2023-10-05 (×2): 20 mg via INTRAVENOUS
  Filled 2023-10-05 (×2): qty 2

## 2023-10-05 MED ORDER — ACETAMINOPHEN 325 MG PO TABS
650.0000 mg | ORAL_TABLET | Freq: Four times a day (QID) | ORAL | Status: DC | PRN
  Administered 2023-10-05 – 2023-10-06 (×2): 650 mg via ORAL
  Filled 2023-10-05 (×2): qty 2

## 2023-10-05 MED ORDER — LEVOTHYROXINE SODIUM 100 MCG PO TABS
100.0000 ug | ORAL_TABLET | Freq: Every day | ORAL | Status: DC
  Administered 2023-10-06 – 2023-10-08 (×3): 100 ug via ORAL
  Filled 2023-10-05 (×4): qty 1

## 2023-10-05 MED ORDER — METOPROLOL TARTRATE 25 MG PO TABS
25.0000 mg | ORAL_TABLET | Freq: Two times a day (BID) | ORAL | Status: DC
  Administered 2023-10-05 – 2023-10-07 (×4): 25 mg via ORAL
  Filled 2023-10-05 (×4): qty 1

## 2023-10-05 MED ORDER — FLUTICASONE PROPIONATE 50 MCG/ACT NA SUSP
2.0000 | Freq: Every day | NASAL | Status: DC
  Administered 2023-10-05 – 2023-10-08 (×4): 2 via NASAL
  Filled 2023-10-05: qty 16

## 2023-10-05 MED ORDER — HYDROXYZINE HCL 25 MG PO TABS
25.0000 mg | ORAL_TABLET | Freq: Four times a day (QID) | ORAL | Status: DC | PRN
  Administered 2023-10-05 – 2023-10-07 (×4): 25 mg via ORAL
  Filled 2023-10-05 (×4): qty 1

## 2023-10-05 NOTE — Progress Notes (Signed)
 Gastroenterology Progress Note   Referring Provider: No ref. provider found Primary Care Physician:  [facility] Primary Gastroenterologist:  [name], MD Patient ID: Shawna [name]; [id number]; July 13, 1940   Subjective:    Feels hungry. She denies further rectal bleeding but history unreliable. Per nursing, no documented bleeding.  Objective:   Vital signs in last 24 hours: Temp:  [97.9 F (36.6 C)-98.7 F (37.1 C)] 98.7 F (37.1 C) (03/26 2131) Pulse Rate:  [79-95] 85 (03/26 1508) Resp:  [12-23] 18 (03/26 2131) BP: (101-164)/(55-78) 107/74 (03/26 2131) SpO2:  [99 %-100 %] 100 % (03/26 2131) Weight:  [51 kg-52.3 kg] 51 kg (03/26 1459)   General:   Alert,  Well-developed, well-nourished, pleasant and cooperative in NAD Head:  Normocephalic and atraumatic. Eyes:  Sclera clear, no icterus.   Abdomen:  Soft, nontender and nondistended.   Normal bowel sounds, without guarding, and without rebound.   Extremities:  Without clubbing, deformity or edema. Neurologic:  Alert and  oriented to person, place. Skin:  Intact without significant lesions or rashes. Psych:  Alert and cooperative. Normal mood and affect.  Intake/Output from previous day: 03/26 0701 - 03/27 0700 In: 240 [P.O.:240] Out: 1500  Intake/Output this shift: No intake/output data recorded.  Lab Results: CBC Recent Labs    10/03/23 2035 10/04/23 1115 10/05/23 0730  WBC 4.3 3.9*  --   HGB 8.3* 7.4* 7.7*  HCT 26.4* 23.4* 23.8*  MCV 107.3* 107.3*  --   PLT 168 153  --    BMET Recent Labs    10/03/23 2035 10/04/23 1115  NA 140 141  K 3.2* 3.6  CL 102 103  CO2 29 26  GLUCOSE 154* 72  BUN 21 21  CREATININE 3.95* 4.56*  CALCIUM 8.5* 8.3*   LFTs Recent Labs    10/03/23 2035 10/04/23 1115  BILITOT 0.9 1.2  ALKPHOS 182* 158*  AST 32 26  ALT 20 17  PROT 6.9 6.2*  ALBUMIN 3.0* 2.7*   No results for input(s): "LIPASE" in the last 72 hours. PT/INR No results for input(s):  "LABPROT", "INR" in the last 72 hours.       Imaging Studies: DG Chest Portable 1 View Result Date: 09/20/2023 CLINICAL DATA:  Weakness. EXAM: PORTABLE CHEST 1 VIEW COMPARISON:  Chest radiograph dated 09/16/2023. FINDINGS: Dialysis catheter in similar position. Shallow inspiration. Small bilateral pleural effusions and bibasilar atelectasis. Pneumonia is not excluded no pneumothorax. Stable cardiac silhouette. Median sternotomy wires and CABG vascular clips. No acute osseous pathology. IMPRESSION: Small bilateral pleural effusions and bibasilar atelectasis. Electronically Signed   By: Elgie Collard M.D.   On: 09/20/2023 18:24   DG Chest Portable 1 View Result Date: 09/16/2023 CLINICAL DATA:  Weakness EXAM: PORTABLE CHEST 1 VIEW COMPARISON:  September 08, 2023 FINDINGS: The cardiomediastinal silhouette is unchanged and enlarged in contour.Status post median sternotomy and CABG. LEFT chest CVC with tip terminating over the RIGHT atrium. LEFT axillary stent. Moderate bilateral pleural effusions, increased since prior. No pneumothorax. Background of interstitial prominence. Bibasilar airspace opacities. Atherosclerotic calcifications. IMPRESSION: 1. Moderate bilateral pleural effusions, increased since prior. 2. Background of pulmonary edema with bibasilar airspace opacities, likely atelectasis. Electronically Signed   By: Meda Klinefelter M.D.   On: 09/16/2023 16:56   CT Head Wo Contrast Result Date: 09/08/2023 CLINICAL DATA:  Altered mental status. EXAM: CT HEAD WITHOUT CONTRAST TECHNIQUE: Contiguous axial images were obtained from the base of the skull through the vertex without intravenous contrast. RADIATION DOSE REDUCTION: This  exam was performed according to the departmental dose-optimization program which includes automated exposure control, adjustment of the mA and/or kV according to patient size and/or use of iterative reconstruction technique. COMPARISON:  Head CT dated 04/10/2023. FINDINGS:  Evaluation of this exam is limited due to motion artifact. Brain: Moderate age-related atrophy and chronic microvascular ischemic changes. There is no acute intracranial hemorrhage. No mass effect or midline shift. No extra-axial fluid collection. Vascular: No hyperdense vessel or unexpected calcification. Skull: Normal. Negative for fracture or focal lesion. Sinuses/Orbits: There is mucoperiosteal thickening of paranasal sinuses. No air-fluid level. The mastoid air cells are clear. Other: None IMPRESSION: 1. No acute intracranial pathology. 2. Moderate age-related atrophy and chronic microvascular ischemic changes. Electronically Signed   By: Elgie Collard M.D.   On: 09/08/2023 18:42   DG Chest Port 1 View Result Date: 09/08/2023 CLINICAL DATA:  Weakness entire.  Bradycardia EXAM: PORTABLE CHEST 1 VIEW COMPARISON:  X-ray 08/19/2023 FINDINGS: Enlarged cardiopericardial silhouette with sternal wires. Double-lumen left IJ catheter with tip overlying the right atrium. Small bilateral pleural effusions with adjacent opacities. The opacities are increased from previous. No pneumothorax. Vascular gesture with some interstitial changes, possibly edema. Fluid tracks along the minor fissure. Overlapping cardiac leads. Vascular stent along the left upper extremity at the edge of the imaging field. Patient is tilted IMPRESSION: Enlarged heart with vascular congestion and trace edema. Small bilateral pleural effusions. Lung base opacities are seen, increased on the right. Left IJ double-lumen catheter with tip overlying the right atrium. Electronically Signed   By: Karen Kays M.D.   On: 09/08/2023 16:14  [2 weeks]  Assessment:   Shawna [name] is a 83 y.o. year old female with history of dementia, ESRD on hemodialysis, A-fib on aspirin, CAD, CHF, recurrent gastric and small bowel AVMs, GERD, HTN, colon cancer s/p resection in 1992, duodenal adenoma who presented to the ED with c/o rectal bleeding. Hgb 8.3, actually  up from 7.1 two weeks ago. GI consulted for further evaluation.   Anemia/recurrent GI bleeding: patient with reports of one episode of darker red blood mixed in with stool on Monday. She denies any other GI symptoms. She has known recurrent small bowel and gastric AVMs. Confirmed she is receiving octreotide injections by her PCP at [facility] with last dose 10/02/23. In the ED two weeks ago with Hgb 7.4, received one unit of prbcs. On presentation yesterday, Hgb 8.3 then down to 7.4, back up to 7.7 today. Likely mostly hemodilution. She had BM yesterday without documented bleeding. Current iron studies with iron 121, TIBC 131, saturation 92, ferritin was not checked. Folate WNL.   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 She is receiving blood transfusions about monthly. At this time, given no further rectal bleeding and in light of recent upper and lower endoscopic evaluations last year, suspect this is recurrent bleeding from angiodysplastic lesions.      Plan:   Continue monthly octreotide. PPI BID. Renal diet. Consider outpatient hematology evaluation/management for anemia.    LOS: 0 days   Leanna Battles. Dixon [facility]os [facility] [phone] 3/27/20258:09 AM

## 2023-10-05 NOTE — Progress Notes (Signed)
 PROGRESS NOTE    Patient: Shawna [name]                            PCP: [facility]                    DOB: October 09, 1940            DOA: 10/03/2023 [id number]             DOS: 10/05/2023, 9:53 AM   LOS: 0 days   Date of Service: The patient was seen and examined on 10/05/2023  Subjective:   Patient was seen and examined this morning, stable no acute distress, complaining of generalized weaknesses, Mildly hypotensive, no further bowel movement-denies of any rectal bleed nausea or vomiting. Tolerated hemodialysis yesterday.  Brief Narrative:   Shawna [name] is a 83 y.o. female with medical history significant of ESRD w hemodialysis (MWF), CAD status post CABG, HFrEF, T2DM, HTN, GERD, atrial fibrillation (not on anticoagulants) who presents to the emergency department due to bright red rectal bleeding which started today.  She has a history of recurrent GI bleed.  Last dialysis session was yesterday.   ED Course: Hemodynamically stable.  Workup in the ED showed Hgb 8.3  BMP was normal except for potassium of 3.2, blood glucose 154, creatinine 3.95, albumin 3.0 crocytic anemia Gastroenterologist (Dr. Levon Hedger) was consulted and will follow-up with patient in the morning per EDP.     Assessment & Plan:   Active Problems:   Macrocytic anemia   End-stage renal disease on hemodialysis [facility])   Essential hypertension   Paroxysmal atrial fibrillation (HCC)   Acquired hypothyroidism   Mixed hyperlipidemia   Chronic combined systolic and diastolic CHF (congestive heart failure) (HCC)   Hypoalbuminemia due to protein-calorie malnutrition (HCC)   Hypokalemia   GI bleed    Acute on Chronic GI bleed H/H= 8.3/26.4, this was 7.1/22.0 on 09/20/2023 -Hemoccult positive x 2    Latest Ref Rng & Units 10/05/2023    7:30 AM 10/04/2023   11:15 AM 10/03/2023    8:35 PM  CBC  WBC 4.0 - 10.5 K/uL  3.9  4.3   Hemoglobin 12.0 - 15.0 g/dL 7.7  7.4  8.3   Hematocrit 36.0 - 46.0 % 23.8   23.4  26.4   Platelets 150 - 400 K/uL  153  168     Gastroenterologist  consulted-appreciate follow-up -No plan for further investigation or endoscopy Discussed with nephrology, patient will benefit from 1U PRBC transfusion today 10/05/2023   Macrocytic Anemia Likely anemia of chronic disease due to CKD / ESRD  MCV 107.3, hemoglobin 8.3 Total iron level 121, folate greater than 40, B12 1519, all within normal limits Vitamin B12 and folate    Hypoalbuminemia possibly due to moderate protein calorie malnutrition Albumin 3.0, consider protein supplement when patient resumes oral intake   Hypokalemia K+ 3.2, this will be replenished   ESRD on HD (MWF) Last dialysis last (3/24), hemodialysis 10/04/2023 Continue [facility] following closely   Chronic combined systolic and diastolic CHF Echocardiogram from 08/22/23 with EF of 45 to 50% with grade 2 diastolic dysfunction  Manage volume with HD.     Essential hypertension Continue home hydralazine  Stable  Paroxysmal A-fib Rate control meds were held during last admission due to symptomatic bradycardia Patient is not on anticoagulation due to history of GI bleed in the past and symptomatic anemia requiring multiple transfusions.  Mixed hyperlipidemia Continue Crestor -Stable   Acquired hypothyroidism Continue Synthroid  -Remained stable   -----------------------------------------------------------------------------------------------------------------------------------------  DVT prophylaxis:  SCDs Start: 10/04/23 0014   Code Status:   Code Status: Full Code  Family Communication: No family member present at bedside- -Advance care planning has been discussed.   Admission status:   Status is: Observation The patient remains OBS appropriate and will d/c before 2 midnights.   Disposition: From  - home             Planning for discharge in 1 days: to Home after HD    Procedures:   No admission procedures  for hospital encounter.   Antimicrobials:  Anti-infectives (From admission, onward)    None        Medication:   sodium chloride   Intravenous Once   Chlorhexidine Gluconate Cloth  6 each Topical Q0600   Chlorhexidine Gluconate Cloth  6 each Topical Q0600   cinacalcet  30 mg Oral Q M,W,F-HD   hydrALAZINE  25 mg Oral BID   [START ON 10/06/2023] levothyroxine  100 mcg Oral QAC breakfast   rosuvastatin  10 mg Oral Daily    ondansetron **OR** ondansetron (ZOFRAN) IV   Objective:   Vitals:   10/04/23 1440 10/04/23 1459 10/04/23 1508 10/04/23 2131  BP: 138/71  (!) 141/63 107/74  Pulse: 80  85   Resp: 20  19 18   Temp: 98 F (36.7 C)  97.9 F (36.6 C) 98.7 F (37.1 C)  TempSrc: Oral  Oral   SpO2: 100%  100% 100%  Weight:  51 kg      Intake/Output Summary (Last 24 hours) at 10/05/2023 0953 Last data filed at 10/04/2023 1837 Gross per 24 hour  Intake 120 ml  Output 1500 ml  Net -1380 ml   Filed Weights   10/04/23 1119 10/04/23 1459  Weight: 52.3 kg 51 kg     Physical examination:        General:  AAO x 3,  cooperative, no distress;   HEENT:  Normocephalic, PERRL, otherwise with in Normal limits   Neuro:  CNII-XII intact. , normal motor and sensation, reflexes intact   Lungs:   Clear to auscultation BL, Respirations unlabored,  No wheezes / crackles  Cardio:    S1/S2, RRR, No murmure, No Rubs or Gallops   Abdomen:  Soft, non-tender, bowel sounds active all four quadrants, no guarding or peritoneal signs.  Muscular  skeletal:  Limited exam -global generalized weaknesses - in bed, able to move all 4 extremities,   2+ pulses,  symmetric, No pitting edema  Skin:  Dry, warm to touch, negative for any Rashes,  Wounds: Please see nursing documentation          ------------------------------------------------------------------------------------------------------------------------------------------    LABs:     Latest Ref Rng & Units 10/05/2023    7:30 AM  10/04/2023   11:15 AM 10/03/2023    8:35 PM  CBC  WBC 4.0 - 10.5 K/uL  3.9  4.3   Hemoglobin 12.0 - 15.0 g/dL 7.7  7.4  8.3   Hematocrit 36.0 - 46.0 % 23.8  23.4  26.4   Platelets 150 - 400 K/uL  153  168       Latest Ref Rng & Units 10/04/2023   11:15 AM 10/03/2023    8:35 PM 09/20/2023   12:42 PM  CMP  Glucose 70 - 99 mg/dL 72  161  096   BUN 8 - 23 mg/dL 21  21  33  Creatinine 0.44 - 1.00 mg/dL 1.61  0.96  0.45   Sodium 135 - 145 mmol/L 141  140  137   Potassium 3.5 - 5.1 mmol/L 3.6  3.2  3.1   Chloride 98 - 111 mmol/L 103  102  100   CO2 22 - 32 mmol/L 26  29  24    Calcium 8.9 - 10.3 mg/dL 8.3  8.5  8.1   Total Protein 6.5 - 8.1 g/dL 6.2  6.9  6.6   Total Bilirubin 0.0 - 1.2 mg/dL 1.2  0.9  0.8   Alkaline Phos 38 - 126 U/L 158  182  153   AST 15 - 41 U/L 26  32  24   ALT 0 - 44 U/L 17  20  13         Micro Results No results found for this or any previous visit (from the past 240 hours).  Radiology Reports No results found.  SIGNED: Kendell Bane, MD, FHM. FAAFP. [facility] - [facility] Time spent - 55 min.  In seeing, evaluating and examining the patient. Reviewing medical records, labs, drawn plan of care. [facility],  Pager (please use [email or web address] to page/ text) Please use Epic Secure Chat for non-urgent communication (7AM-7PM)  If 7PM-7AM, please contact night-coverage [email or web address], 10/05/2023, 9:53 AM

## 2023-10-05 NOTE — Plan of Care (Signed)
  Problem: Education: Goal: Knowledge of General Education information will improve Description: Including pain rating scale, medication(s)/side effects and non-pharmacologic comfort measures Outcome: Progressing   Problem: Health Behavior/Discharge Planning: Goal: Ability to manage health-related needs will improve Outcome: Progressing   Problem: Clinical Measurements: Goal: Ability to maintain clinical measurements within normal limits will improve Outcome: Progressing Goal: Will remain free from infection Outcome: Progressing Goal: Diagnostic test results will improve Outcome: Progressing Goal: Respiratory complications will improve Outcome: Progressing Goal: Cardiovascular complication will be avoided Outcome: Progressing   Problem: Activity: Goal: Risk for activity intolerance will decrease Outcome: Not Progressing   Problem: Nutrition: Goal: Adequate nutrition will be maintained Outcome: Not Progressing   Problem: Coping: Goal: Level of anxiety will decrease Outcome: Progressing   Problem: Elimination: Goal: Will not experience complications related to bowel motility Outcome: Progressing Goal: Will not experience complications related to urinary retention Outcome: Progressing   Problem: Pain Managment: Goal: General experience of comfort will improve and/or be controlled Outcome: Progressing   Problem: Safety: Goal: Ability to remain free from injury will improve Outcome: Progressing   Problem: Skin Integrity: Goal: Risk for impaired skin integrity will decrease Outcome: Progressing

## 2023-10-05 NOTE — Care Management Obs Status (Signed)
 MEDICARE OBSERVATION STATUS NOTIFICATION   Patient Details  Name: YURIANA GAAL MRN: [id number] Date of Birth: 16-Oct-1940   Medicare Observation Status Notification Given:  Yes    [name] 10/05/2023, 9:32 AM

## 2023-10-05 NOTE — Progress Notes (Signed)
 Mobility Specialist Progress Note:    10/05/23 1115  Mobility  Activity Ambulated with assistance in room;Ambulated with assistance to bathroom  Level of Assistance Contact guard assist, steadying assist  Assistive Device None  Distance Ambulated (ft) 35 ft  Range of Motion/Exercises Active;All extremities  Activity Response Tolerated well  Mobility Referral Yes  Mobility visit 1 Mobility  Mobility Specialist Start Time (ACUTE ONLY) 1100  Mobility Specialist Stop Time (ACUTE ONLY) 1115  Mobility Specialist Time Calculation (min) (ACUTE ONLY) 15 min   Pt received in bed requesting assistance to bathroom. Required 1 person hand held assist to stand and ambulate with no AD. Tolerated well, asx throughout. Left pt with NT, all needs met.  Lawerance Bach Mobility Specialist Please contact via Special educational needs teacher or  Rehab office at [phone]

## 2023-10-05 NOTE — Progress Notes (Signed)
 Responded to rapid response called on this patient. Dr. Mariea Clonts also at bedside. Patient reported "not feeling right" around 2pm today, found to be in AFib (not new) with elevated HR. She did receive oral metoprolol earlier today. Had 1.5L UF with routine HD yesterday. She denies any chest pain or palpitations. Was helped to the commode by RN and had non-bloody appearing BM. RN reports she was more alert and interactive earlier and she is clearly withdrawn now.   ECG interpreted at bedside shows LBBB morphology (similar to previous) and AFib rhythm (last ECG 3/12 was sinus). BP on softer side and patient's mentation has changed.   AFib with RVR complicated by hypotension:  - Transfer to SDU for closer monitoring, consideration of amiodarone infusion.  - She is getting 1u RBC transfused, last hgb 7.7g/dl. Symptoms began prior to transfusion. Not on anticoagulation chronically, currently admitted for GI bleed without evidence of ongoing bleeding. Complete transfusion.  - Mg 1.6, give IV now.  - Plan to continue metoprolol. Hx LVEF 45-50%, not ideal CCB candidate.  - Confirmed full code.   Later addendum:  - Have been monitoring intermittently on arrival to ICU room 1, converted to NSR and BP now back to elevated recent baseline. She is back to normal mental status, visiting with husband in the room.  - Repeat ECG confirms Sinus rhythm. P wave abnormality in limb leads is stable from prior, LBBB morphology and T wave flattening also stable.  - Will keep under close monitoring for now, repeat CBC, RFP, Mg in AM.   Hazeline Junker, MD 10/05/2023 5:37 PM    CRITICAL CARE Performed by: Tyrone Nine  Total critical care time: 40 minutes  Critical care time was exclusive of separately billable procedures and treating other patients.  Critical care was necessary to treat or prevent imminent or life-threatening deterioration.  Critical care was time spent personally by me on the following activities:  development of treatment plan with patient and/or surrogate as well as nursing, discussions with consultants, evaluation of patient's response to treatment, examination of patient, obtaining history from patient or surrogate, ordering and performing treatments and interventions, ordering and review of laboratory studies, ordering and review of radiographic studies, pulse oximetry and re-evaluation of patient's condition.

## 2023-10-05 NOTE — Progress Notes (Addendum)
 [facility] Progress Note   Subjective:   Feels good this AM just hungry as NPO in case scope needed.  No further GI bleeding noted.  Had HD yesterday and tolerated well 1.5L UF.    Objective Vitals:   10/04/23 1440 10/04/23 1459 10/04/23 1508 10/04/23 2131  BP: 138/71  (!) 141/63 107/74  Pulse: 80  85   Resp: 20  19 18   Temp: 98 F (36.7 C)  97.9 F (36.6 C) 98.7 F (37.1 C)  TempSrc: Oral  Oral   SpO2: 100%  100% 100%  Weight:  51 kg     Physical Exam General: well appearing in bed Heart: RRR Lungs: clear Abdomen: soft Extremities: no edema Dialysis Access:  [facility] c/d/i  Additional Objective Labs: Basic Metabolic Panel: Recent Labs  Lab 10/03/23 2035 10/04/23 1115  NA 140 141  K 3.2* 3.6  CL 102 103  CO2 29 26  GLUCOSE 154* 72  BUN 21 21  CREATININE 3.95* 4.56*  CALCIUM 8.5* 8.3*  PHOS  --  3.6   Liver Function Tests: Recent Labs  Lab 10/03/23 2035 10/04/23 1115  AST 32 26  ALT 20 17  ALKPHOS 182* 158*  BILITOT 0.9 1.2  PROT 6.9 6.2*  ALBUMIN 3.0* 2.7*   No results for input(s): "LIPASE", "AMYLASE" in the last 168 hours. CBC: Recent Labs  Lab 10/03/23 2035 10/04/23 1115 10/05/23 0730  WBC 4.3 3.9*  --   HGB 8.3* 7.4* 7.7*  HCT 26.4* 23.4* 23.8*  MCV 107.3* 107.3*  --   PLT 168 153  --    Blood Culture    Component Value Date/Time   SDES BLOOD RIGHT HAND 08/19/2023 2247   SPECREQUEST NONE 08/19/2023 2247   CULT  08/19/2023 2247    NO GROWTH 5 DAYS Performed at [facility], [address] [place], [place] [address]    REPTSTATUS 08/24/2023 FINAL 08/19/2023 2247    Cardiac Enzymes: No results for input(s): "CKTOTAL", "CKMB", "CKMBINDEX", "TROPONINI" in the last 168 hours. CBG: No results for input(s): "GLUCAP" in the last 168 hours. Iron Studies:  Recent Labs    10/04/23 1115  IRON 121  TIBC 131*   @lablastinr3 @ Studies/Results: No results found. Medications:   sodium chloride   Intravenous Once    Chlorhexidine Gluconate Cloth  6 each Topical Q0600   Chlorhexidine Gluconate Cloth  6 each Topical Q0600   cinacalcet  30 mg Oral Q M,W,F-HD   hydrALAZINE  25 mg Oral BID   [START ON 10/06/2023] levothyroxine  100 mcg Oral QAC breakfast   rosuvastatin  10 mg Oral Daily    Assessment/Plan   ESRD  -outpatient HD orders: [name] [phone]  4 hours.  EDW 54 kg.  TDC.  Nepro 17 H Elisio.  Flow rates: 300/500.  2K/2.5 calcium.  Heparin: Only heparin locks for TDC.  Meds: Calcitriol 1.5 mcg 3 times a week, ID PN, Mircera 200 mcg every 2 weeks - last given 3/24, sensipar 30 -HD yesterday, next would be tomorrow if remains inpt -heparin free except cath lock -below EDW - would reset to 51kg at discharge   GI bleed -GI consulted, serial H/H, she's currently NPO in case endo needed today    Chronic combined systolic and diastolic CHF exacerbation -UF as tolerated with HD, looking fairly euvolemic today after 1.5L yest; below EDW will need reset at d/c   Hypokalemia: resolved   Volume/ hypertension  -UF as tolerated   Anemia of Chronic  Kidney Disease, ABLA -Hgb 8.3 > 7.4 > 7.7 - serial H/H per primary -had mircera 3/24, not due for 2 weeks -Transfuse PRN for Hgb <7   Secondary Hyperparathyroidism/Hyperphosphatemia - cont outpt meds, monitor phos - currently at goal   Paroxysmal Afib -not on a/c given history of anemia/GIB -per primary   Will follow, message with concerns. OK for d/c from my perspective  Estill Bakes MD 10/05/2023, 8:33 AM   [facility] Pager: [phone]

## 2023-10-06 ENCOUNTER — Observation Stay (HOSPITAL_COMMUNITY)

## 2023-10-06 ENCOUNTER — Telehealth: Payer: Self-pay | Admitting: Gastroenterology

## 2023-10-06 DIAGNOSIS — D539 Nutritional anemia, unspecified: Secondary | ICD-10-CM | POA: Diagnosis not present

## 2023-10-06 DIAGNOSIS — R109 Unspecified abdominal pain: Secondary | ICD-10-CM

## 2023-10-06 DIAGNOSIS — K922 Gastrointestinal hemorrhage, unspecified: Secondary | ICD-10-CM | POA: Diagnosis not present

## 2023-10-06 DIAGNOSIS — R1110 Vomiting, unspecified: Secondary | ICD-10-CM

## 2023-10-06 LAB — CBC
HCT: 30.2 % — ABNORMAL LOW (ref 36.0–46.0)
Hemoglobin: 9.7 g/dL — ABNORMAL LOW (ref 12.0–15.0)
MCH: 32.4 pg (ref 26.0–34.0)
MCHC: 32.1 g/dL (ref 30.0–36.0)
MCV: 101 fL — ABNORMAL HIGH (ref 80.0–100.0)
Platelets: 173 10*3/uL (ref 150–400)
RBC: 2.99 MIL/uL — ABNORMAL LOW (ref 3.87–5.11)
RDW: 27.1 % — ABNORMAL HIGH (ref 11.5–15.5)
WBC: 4.3 10*3/uL (ref 4.0–10.5)
nRBC: 0 % (ref 0.0–0.2)

## 2023-10-06 LAB — MAGNESIUM: Magnesium: 2 mg/dL (ref 1.7–2.4)

## 2023-10-06 LAB — RENAL FUNCTION PANEL
Albumin: 2.7 g/dL — ABNORMAL LOW (ref 3.5–5.0)
Anion gap: 12 (ref 5–15)
BUN: 25 mg/dL — ABNORMAL HIGH (ref 8–23)
CO2: 25 mmol/L (ref 22–32)
Calcium: 8 mg/dL — ABNORMAL LOW (ref 8.9–10.3)
Chloride: 98 mmol/L (ref 98–111)
Creatinine, Ser: 4.86 mg/dL — ABNORMAL HIGH (ref 0.44–1.00)
GFR, Estimated: 8 mL/min — ABNORMAL LOW (ref >60)
Glucose, Bld: 202 mg/dL — ABNORMAL HIGH (ref 70–99)
Phosphorus: 3.2 mg/dL (ref 2.5–4.6)
Potassium: 3.7 mmol/L (ref 3.5–5.1)
Sodium: 135 mmol/L (ref 135–145)

## 2023-10-06 LAB — T3, FREE: T3, Free: 1.8 pg/mL — ABNORMAL LOW (ref 2.0–4.4)

## 2023-10-06 MED ORDER — DIPHENHYDRAMINE HCL 25 MG PO CAPS
ORAL_CAPSULE | ORAL | Status: AC
  Filled 2023-10-06: qty 1

## 2023-10-06 MED ORDER — HEPARIN SODIUM (PORCINE) 1000 UNIT/ML IJ SOLN
INTRAMUSCULAR | Status: AC
  Filled 2023-10-06: qty 4

## 2023-10-06 MED ORDER — FENTANYL CITRATE PF 50 MCG/ML IJ SOSY: 12.5000 ug | PREFILLED_SYRINGE | INTRAMUSCULAR | Status: DC | PRN

## 2023-10-06 MED ORDER — HEPARIN SODIUM (PORCINE) 1000 UNIT/ML DIALYSIS: 1000.0000 [IU] | INTRAMUSCULAR | Status: DC | PRN

## 2023-10-06 MED ORDER — ALTEPLASE 2 MG IJ SOLR: 2.0000 mg | Freq: Once | INTRAMUSCULAR | Status: DC | PRN

## 2023-10-06 MED ORDER — DIPHENHYDRAMINE HCL 25 MG PO CAPS
25.0000 mg | ORAL_CAPSULE | Freq: Once | ORAL | Status: AC
  Administered 2023-10-06: 25 mg via ORAL

## 2023-10-06 MED ORDER — ANTICOAGULANT SODIUM CITRATE 4% (200MG/5ML) IV SOLN: 5.0000 mL | Status: DC | PRN

## 2023-10-06 NOTE — Progress Notes (Addendum)
 [facility] Progress Note   Subjective:   Had A fib with RVR yesterday with resultant Hypotension resulting in transfer to stepdown.  She rec'd 1u pRBC for Hb in 7s but reports no further GI bleeding.  She ultimately converted back to NSR.  AM labs are pending.   I saw her on dialysis - says she doesn't feel well but only notes itchy feet. Requesting benadryl says she tolerates it fine.  BP 150s, HR NSR 70s.  Afebrile.    Objective Vitals:   10/06/23 0300 10/06/23 0400 10/06/23 0515 10/06/23 0959  BP: 136/88 118/79 (!) 117/93   Pulse: 82 91 97   Resp: (!) 27 (!) 22  18  Temp: 97.6 F (36.4 C)  (!) 97.5 F (36.4 C)   TempSrc: Oral  Axillary   SpO2: 96% 91% 98%   Weight:      Height:       Physical Exam General: looking frail in bed, moaning some Heart: RRR Lungs: clear Abdomen: soft Extremities: no edema Dialysis Access:  [facility] c/d/i  Additional Objective Labs: Basic Metabolic Panel: Recent Labs  Lab 10/03/23 2035 10/04/23 1115  NA 140 141  K 3.2* 3.6  CL 102 103  CO2 29 26  GLUCOSE 154* 72  BUN 21 21  CREATININE 3.95* 4.56*  CALCIUM 8.5* 8.3*  PHOS  --  3.6   Liver Function Tests: Recent Labs  Lab 10/03/23 2035 10/04/23 1115  AST 32 26  ALT 20 17  ALKPHOS 182* 158*  BILITOT 0.9 1.2  PROT 6.9 6.2*  ALBUMIN 3.0* 2.7*   No results for input(s): "LIPASE", "AMYLASE" in the last 168 hours. CBC: Recent Labs  Lab 10/03/23 2035 10/04/23 1115 10/05/23 0730 10/05/23 2156  WBC 4.3 3.9*  --   --   HGB 8.3* 7.4* 7.7* 9.9*  HCT 26.4* 23.4* 23.8* 30.8*  MCV 107.3* 107.3*  --   --   PLT 168 153  --   --    Blood Culture    Component Value Date/Time   SDES BLOOD RIGHT HAND 08/19/2023 2247   SPECREQUEST NONE 08/19/2023 2247   CULT  08/19/2023 2247    NO GROWTH 5 DAYS Performed at [facility], [address] [place], [place] [address]    REPTSTATUS 08/24/2023 FINAL 08/19/2023 2247    Cardiac Enzymes: No results for input(s):  "CKTOTAL", "CKMB", "CKMBINDEX", "TROPONINI" in the last 168 hours. CBG: Recent Labs  Lab 10/05/23 1508  GLUCAP 155*   Iron Studies:  Recent Labs    10/04/23 1115  IRON 121  TIBC 131*   @lablastinr3 @ Studies/Results: No results found. Medications:  anticoagulant sodium citrate      Chlorhexidine Gluconate Cloth  6 each Topical Q0600   Chlorhexidine Gluconate Cloth  6 each Topical Q0600   cinacalcet  30 mg Oral Q M,W,F-HD   fluticasone  2 spray Each Nare Daily   levothyroxine  100 mcg Oral QAC breakfast   metoprolol tartrate  25 mg Oral BID   rosuvastatin  10 mg Oral Daily    Assessment/Plan   ESRD  -outpatient HD orders: [name] [phone]  4 hours.  EDW 54 kg.  TDC.  Nepro 17 H Elisio.  Flow rates: 300/500.  2K/2.5 calcium.  Heparin: Only heparin locks for TDC.  Meds: Calcitriol 1.5 mcg 3 times a week, ID PN, Mircera 200 mcg every 2 weeks - last given 3/24, sensipar 30 -HD today - UF as tolerated -heparin free except cath lock -  below EDW - would reset to 51kg at discharge   GI bleed -GI consulted, serial H/H, hasn't had further bleeding and endoscopy hasn't been necessary   Chronic combined systolic and diastolic CHF exacerbation -UF as tolerated with HD, looking fairly euvolemic today after 1.5L Wed with post wt 51kg.    Volume/ hypertension  -UF as tolerated   Anemia of Chronic Kidney Disease, ABLA -Hgb 8.3 > 7.4 > 7.7 transfused 1u pRBC 3/27 - serial H/H per primary -had mircera 3/24, not due for 2 weeks -Transfuse PRN for Hgb <7   Secondary Hyperparathyroidism/Hyperphosphatemia - cont outpt meds, monitor phos - currently at goal   Paroxysmal Afib -not on a/c given history of anemia/GIB -had A fib with RVR 3/27 - converted and is currently NSR -per primary   Nephrology will be available and following remotely over the weekend.  If she remains admitted would plan for next HD Monday.  Please reach out with any concerns, we're happy to  help.  Estill Bakes MD 10/06/2023, 10:40 AM  [facility] Pager: [phone]

## 2023-10-06 NOTE — Progress Notes (Signed)
  HEMODIALYSIS TREATMENT NOTE:  3 hour heparin-free treatment completed using left internal jugular TDC - exit site is unremarkable. Unable to tolerate UF.  Had a brief episode of Afib with HR 110-112 with associated anxiety and retching - no emesis.  Converted to NSR after NS bolus and interruption of UF.  She had her metoprolol this morning, but the level is reduced with HD. Net UF 0.    Post-HD:  10/06/23 1353  Vital Signs  Temp 97.7 F (36.5 C)  Temp Source Oral  Pulse Rate 68  Pulse Rate Source Monitor  Resp 18  BP 136/64  BP Location Right Arm  BP Method Automatic  Patient Position (if appropriate) Lying  Oxygen Therapy  SpO2 100 %  O2 Device Room Air  Pain Assessment  Pain Score 0  Dialysis Weight  Weight 51.2 kg  Type of Weight Post-Dialysis  Post Treatment  Dialyzer Clearance Lightly streaked  Hemodialysis Intake (mL) 100 mL  Liters Processed 58.7  Fluid Removed (mL) 0 mL  Tolerated HD Treatment No (Comment)  Post-Hemodialysis Comments See PN     Arman Filter, RN AP [facility]

## 2023-10-06 NOTE — Progress Notes (Signed)
 Patient was seen for abdominal pain and nausea.   She appears uncomfortable and is clutching emesis bag. She reports abdominal pain that began this afternoon. She reports nausea and feels like she needs to vomit but has not.   Abdomen is soft and non-distended with tenderness across the upper abdomen.   Plan to keep NPO for now, check KUB, treat pain and nausea.

## 2023-10-06 NOTE — Telephone Encounter (Signed)
 Error

## 2023-10-06 NOTE — Telephone Encounter (Signed)
 Please make hospital follow up for patient in 3-4 weeks. Have not seen patient in 1 year. Can be with myself or Dr. Levon Hedger.   Brooke Bonito, MSN, APRN, FNP-BC, AGACNP-BC [facility] at [facility]

## 2023-10-06 NOTE — Progress Notes (Signed)
 [facility] PROGRESS NOTE  Shawna [name] (DOB: April 11, 1941) WUJ:[id number] PCP: [facility]  Brief Narrative: Shawna [name] is an 83 y.o. female with a history of AVMs with recurrent GI bleeding, anemia, HFrEF, CAD s/p CABG on aspirin 81mg , PAF, ESRD on HD MWF, T2DM, and colon CA s/p resection 1992 who presented to the ED on 10/03/2023 with rectal bleeding, suspected to be due to recurrent GI bleeding from AVMs. She was admitted for observation, ultimately underwent transfusion of 1u RBCs, though has not had gross bleeding so no GI invasive evaluation has been pursued.   Subjective: Eating breakfast, has no dyspnea or chest pain. Denies palpitations.   Objective: BP (!) 117/93 (BP Location: Right Arm)   Pulse 97   Temp (!) 97.5 F (36.4 C) (Axillary)   Resp 18   Ht 5\' 2"  (1.575 m)   Wt 53.4 kg   SpO2 98%   BMI 21.53 kg/m   Gen: No distress, elderly, interactive Pulm: Clear, nonlabored  CV: Irreg irreg, rate in 110's, II/VI systolic murmur, dampens S1, no edema GI: Soft, NT, ND, +BS  Neuro: Alert and oriented. No new focal deficits. Ext: Warm, no deformities Skin: No rashes, lesions or ulcers on visualized skin. Left TDC c/d/I, nontender  Assessment & Plan: Acute on chronic blood loss anemia due to recurrent AVM-related GI bleeding: Clinically BRBPR has stopped, hgb up as anticipated after 1u RBCs 3/27.  - Continue monitor H/H. Macrocytosis etiology unclear, though dates to ~2015. No B12/folic acid deficiency, LFTs ok, not on MTX/hydrea. She does have hypothyroidism, though this is being treated. Had no M spike observed on SPEP in 2022. ?If MDS is a concern, could consider BM biopsy after discharge. - Holding aspirin since 2024, continue to avoid anticoagulation.  - GI following, hoping to avoid repeat endoscopic evaluation. Continue monthly octreotide after discharge. Continue PPI BID  PAF with RVR: Converted to NSR 3/27, though back in AFib this morning. Rate  improves with metoprolol - Continue metoprolol 25mg  BID. Per previous note, BB has been held due to symptomatic bradycardia in the past. If RVR recurs may have to consider restarting amiodarone (was on this until Jan 2025).  - Not on CCB with mildly depressed LVEF, though appears euvolemic.   ESRD:  - Continue routine HD MWF per nephrology  Hypothyroidism: TSH 7.372 though free T4 also elevated at 1.45. - Continue synthroid  Chronic combined HFrEF, CAD, HTN, HLD: hx CABG, no chest pain. Last echo Feb 2025 w/LVEF 45-50%, G2DD.  - Continue metoprolol, rosuvastatin. Holding hydralazine while intermittently hypotensive.  - Volume management with HD.  Hypokalemia: Resolved, tx with HD.   Tyrone Nine, MD [facility] [email or web address] 10/06/2023, 10:25 AM

## 2023-10-06 NOTE — Progress Notes (Signed)
 Patient not seen.   Known to our GI service.  History of anemia with likely etiology of AVMs with recurrent bleeding.  Maintained on aspirin.  Has known paroxysmal A-fib.  Has been treated with octreotide monthly.  No overt bleeding observed this admission.  Has had improvement of hemoglobin.  Was 9.9 yesterday and stable at 9.7 today.  Continue to recommend: PPI BID Monthly octreotide Consider outpatient hematology evaluation Follow up outpatient in 3-4 weeks  GI will sign off. Thank you for allowing [place] to participate in her care.   Brooke Bonito, MSN, APRN, FNP-BC, AGACNP-BC [facility] at [facility]

## 2023-10-07 DIAGNOSIS — I2510 Atherosclerotic heart disease of native coronary artery without angina pectoris: Secondary | ICD-10-CM | POA: Diagnosis present

## 2023-10-07 DIAGNOSIS — K922 Gastrointestinal hemorrhage, unspecified: Secondary | ICD-10-CM | POA: Diagnosis not present

## 2023-10-07 DIAGNOSIS — R42 Dizziness and giddiness: Secondary | ICD-10-CM | POA: Diagnosis present

## 2023-10-07 DIAGNOSIS — K5521 Angiodysplasia of colon with hemorrhage: Secondary | ICD-10-CM | POA: Diagnosis present

## 2023-10-07 DIAGNOSIS — D631 Anemia in chronic kidney disease: Secondary | ICD-10-CM | POA: Diagnosis present

## 2023-10-07 DIAGNOSIS — F0390 Unspecified dementia without behavioral disturbance: Secondary | ICD-10-CM | POA: Diagnosis present

## 2023-10-07 DIAGNOSIS — I120 Hypertensive chronic kidney disease with stage 5 chronic kidney disease or end stage renal disease: Secondary | ICD-10-CM | POA: Diagnosis not present

## 2023-10-07 DIAGNOSIS — I132 Hypertensive heart and chronic kidney disease with heart failure and with stage 5 chronic kidney disease, or end stage renal disease: Secondary | ICD-10-CM | POA: Diagnosis present

## 2023-10-07 DIAGNOSIS — I5043 Acute on chronic combined systolic (congestive) and diastolic (congestive) heart failure: Secondary | ICD-10-CM | POA: Diagnosis present

## 2023-10-07 DIAGNOSIS — G9341 Metabolic encephalopathy: Secondary | ICD-10-CM | POA: Diagnosis present

## 2023-10-07 DIAGNOSIS — E782 Mixed hyperlipidemia: Secondary | ICD-10-CM | POA: Diagnosis present

## 2023-10-07 DIAGNOSIS — Z8249 Family history of ischemic heart disease and other diseases of the circulatory system: Secondary | ICD-10-CM | POA: Diagnosis not present

## 2023-10-07 DIAGNOSIS — I480 Paroxysmal atrial fibrillation: Secondary | ICD-10-CM | POA: Diagnosis present

## 2023-10-07 DIAGNOSIS — Z951 Presence of aortocoronary bypass graft: Secondary | ICD-10-CM | POA: Diagnosis not present

## 2023-10-07 DIAGNOSIS — I4891 Unspecified atrial fibrillation: Secondary | ICD-10-CM | POA: Diagnosis present

## 2023-10-07 DIAGNOSIS — K625 Hemorrhage of anus and rectum: Secondary | ICD-10-CM | POA: Diagnosis present

## 2023-10-07 DIAGNOSIS — K31811 Angiodysplasia of stomach and duodenum with bleeding: Secondary | ICD-10-CM | POA: Diagnosis present

## 2023-10-07 DIAGNOSIS — I6200 Nontraumatic subdural hemorrhage, unspecified: Secondary | ICD-10-CM | POA: Diagnosis not present

## 2023-10-07 DIAGNOSIS — R0981 Nasal congestion: Secondary | ICD-10-CM | POA: Diagnosis present

## 2023-10-07 DIAGNOSIS — D539 Nutritional anemia, unspecified: Secondary | ICD-10-CM | POA: Diagnosis not present

## 2023-10-07 DIAGNOSIS — E8809 Other disorders of plasma-protein metabolism, not elsewhere classified: Secondary | ICD-10-CM | POA: Diagnosis present

## 2023-10-07 DIAGNOSIS — Z79890 Hormone replacement therapy (postmenopausal): Secondary | ICD-10-CM | POA: Diagnosis not present

## 2023-10-07 DIAGNOSIS — E440 Moderate protein-calorie malnutrition: Secondary | ICD-10-CM | POA: Diagnosis present

## 2023-10-07 DIAGNOSIS — E039 Hypothyroidism, unspecified: Secondary | ICD-10-CM | POA: Diagnosis present

## 2023-10-07 DIAGNOSIS — N2581 Secondary hyperparathyroidism of renal origin: Secondary | ICD-10-CM | POA: Diagnosis present

## 2023-10-07 DIAGNOSIS — N186 End stage renal disease: Secondary | ICD-10-CM | POA: Diagnosis present

## 2023-10-07 DIAGNOSIS — Z992 Dependence on renal dialysis: Secondary | ICD-10-CM | POA: Diagnosis not present

## 2023-10-07 DIAGNOSIS — K219 Gastro-esophageal reflux disease without esophagitis: Secondary | ICD-10-CM | POA: Diagnosis present

## 2023-10-07 DIAGNOSIS — D62 Acute posthemorrhagic anemia: Secondary | ICD-10-CM | POA: Diagnosis present

## 2023-10-07 DIAGNOSIS — E1122 Type 2 diabetes mellitus with diabetic chronic kidney disease: Secondary | ICD-10-CM | POA: Diagnosis present

## 2023-10-07 DIAGNOSIS — Z79899 Other long term (current) drug therapy: Secondary | ICD-10-CM | POA: Diagnosis not present

## 2023-10-07 DIAGNOSIS — E876 Hypokalemia: Secondary | ICD-10-CM | POA: Diagnosis present

## 2023-10-07 LAB — TYPE AND SCREEN
ABO/RH(D): O POS
Antibody Screen: NEGATIVE
Donor AG Type: NEGATIVE
Donor AG Type: NEGATIVE
Unit division: 0
Unit division: 0

## 2023-10-07 LAB — CBC
HCT: 33.4 % — ABNORMAL LOW (ref 36.0–46.0)
Hemoglobin: 10.3 g/dL — ABNORMAL LOW (ref 12.0–15.0)
MCH: 31.7 pg (ref 26.0–34.0)
MCHC: 30.8 g/dL (ref 30.0–36.0)
MCV: 102.8 fL — ABNORMAL HIGH (ref 80.0–100.0)
Platelets: 164 10*3/uL (ref 150–400)
RBC: 3.25 MIL/uL — ABNORMAL LOW (ref 3.87–5.11)
RDW: 27 % — ABNORMAL HIGH (ref 11.5–15.5)
WBC: 5.2 10*3/uL (ref 4.0–10.5)
nRBC: 0 % (ref 0.0–0.2)

## 2023-10-07 LAB — BPAM RBC
Blood Product Expiration Date: 202504302359
Blood Product Expiration Date: 202504302359
ISSUE DATE / TIME: 202503271404
Unit Type and Rh: 5100
Unit Type and Rh: 5100

## 2023-10-07 MED ORDER — METOPROLOL TARTRATE 25 MG PO TABS
12.5000 mg | ORAL_TABLET | Freq: Two times a day (BID) | ORAL | Status: DC
  Administered 2023-10-07: 12.5 mg via ORAL
  Filled 2023-10-07 (×2): qty 1

## 2023-10-07 MED ORDER — AMIODARONE HCL 200 MG PO TABS
400.0000 mg | ORAL_TABLET | Freq: Two times a day (BID) | ORAL | Status: DC
  Administered 2023-10-07 – 2023-10-08 (×3): 400 mg via ORAL
  Filled 2023-10-07: qty 4
  Filled 2023-10-07 (×2): qty 2

## 2023-10-07 MED ORDER — ORAL CARE MOUTH RINSE: 15.0000 mL | OROMUCOSAL | Status: DC | PRN

## 2023-10-07 NOTE — Plan of Care (Signed)
  Problem: Pain Managment: Goal: General experience of comfort will improve and/or be controlled Outcome: Progressing   Problem: Education: Goal: Knowledge of General Education information will improve Description: Including pain rating scale, medication(s)/side effects and non-pharmacologic comfort measures Outcome: Not Progressing   Problem: Health Behavior/Discharge Planning: Goal: Ability to manage health-related needs will improve Outcome: Not Progressing

## 2023-10-07 NOTE — Progress Notes (Signed)
 [facility] PROGRESS NOTE  Shawna [name] (DOB: 1941-05-11) WGN:[id number] PCP: [facility]  Brief Narrative: Shawna [name] is an 83 y.o. female with a history of AVMs with recurrent GI bleeding, anemia, HFrEF, CAD s/p CABG on aspirin 81mg , PAF, ESRD on HD MWF, T2DM, and colon CA s/p resection 1992 who presented to the ED on 10/03/2023 with rectal bleeding, suspected to be due to recurrent GI bleeding from AVMs. She was admitted for observation, ultimately underwent transfusion of 1u RBCs, though has not had gross bleeding so no GI invasive evaluation has been pursued.   Subjective: Hungry for breakfast, says she wants to get up to bathroom to have BM, she's in high spirits, lively with me this morning, smiling.   This is quite different from RN report about 30 minutes earlier of pt being poorly responsive. Maybe was still very sleepy?  Objective: BP (!) 165/78   Pulse 61   Temp 97.6 F (36.4 C) (Oral)   Resp 20   Ht 5\' 2"  (1.575 m)   Wt 51.2 kg   SpO2 100%   BMI 20.65 kg/m   Gen: Elderly, pleasant female in no distress Pulm: Clear, nonlabored  CV: Regular this AM, was in AFib most of the evening. II/VI stable systolic murmur, no LE edema GI: Soft, NT, ND, +BS Neuro: Alert and remembers me, aware of situation, not asked full battery of orientation questions. No new focal deficits. Ext: Warm, no deformities Skin: Left TDC remains nontender without erythema. No new rashes, lesions or ulcers on visualized skin   Assessment & Plan: Acute on chronic blood loss anemia due to recurrent AVM-related GI bleeding: Clinically BRBPR has stopped, hgb up as anticipated after 1u RBCs 3/27.  - Continue monitor H/H, hgb stable. Macrocytosis etiology unclear, though dates to ~2015. No B12/folic acid deficiency, LFTs ok, not on MTX/hydrea. She does have hypothyroidism, though this is being treated. Had no M spike observed on SPEP in 2022. ?If MDS is a concern, could consider BM biopsy  after discharge. - Holding aspirin since 2024, continue to avoid anticoagulation.  - GI will arrange outpatient follow up, no plan for repeat endoscopic evaluation. Continue monthly octreotide after discharge. Continue PPI BID  PAF with RVR: She has converted frequently between sinus and afib rhythms, seems quite symptomatic (retching, confused, at times lethargic) when in AFib.  - Continue metoprolol 25mg  BID. Per previous note, BB and amiodarone were held due to symptomatic bradycardia in the past. No bradycardia currently noted. In effort at rhythm control with minimizing bradycardia risk, will orally load amiodarone (was on this until Jan 2025).  - Not on CCB with mildly depressed LVEF, continues to appear roughly euvolemic.   Acute metabolic encephalopathy on chronic dementia: Pleasantly confused most of the time, intermittently drowsy and less oriented than baseline. This appears to be due to delirium and worse with atrial fibrillation.  - Monitor closely, treat as above, delirium precautions.   ESRD:  - Continue routine HD MWF per nephrology  Hypothyroidism: TSH 7.372 though free T4 also elevated at 1.45. - Continue synthroid, repeat TFTs at follow up.  Chronic combined HFrEF, CAD, HTN, HLD: hx CABG, no chest pain. Last echo Feb 2025 w/LVEF 45-50%, G2DD.  - Continue metoprolol, rosuvastatin. Holding hydralazine while intermittently hypotensive.  - Volume management with HD.  Hypokalemia: Resolved, tx with HD.   Tyrone Nine, MD [facility] [email or web address] 10/07/2023, 9:48 AM

## 2023-10-07 NOTE — Progress Notes (Signed)
 This nurse entered room approximately 0725 to find patient very lethargic, and only responsive to voice. Per previous nurse, patient had c/o severe abd pain and nausea throughout the night. VSS, patient not following commands or speaking but restless. Dr. Jarvis Newcomer made aware. When this nurse walked by the room approx. 0830, patient was attempting to pull herself up in the bed. This nurse went in and spoke to patient to which patient smiled and replied "good morning sweetie." Still only remained oriented to self at that time. Able to take PO medications without difficulty and transferred to [facility] with [place], NT.

## 2023-10-08 ENCOUNTER — Emergency Department (HOSPITAL_COMMUNITY)
Admission: EM | Admit: 2023-10-08 | Discharge: 2023-10-09 | Disposition: A | Discharge status: Home / Self Care | Attending: Emergency Medicine | Admitting: Emergency Medicine

## 2023-10-08 ENCOUNTER — Encounter (HOSPITAL_COMMUNITY): Payer: Self-pay

## 2023-10-08 ENCOUNTER — Other Ambulatory Visit: Payer: Self-pay

## 2023-10-08 DIAGNOSIS — N186 End stage renal disease: Secondary | ICD-10-CM | POA: Diagnosis not present

## 2023-10-08 DIAGNOSIS — I4891 Unspecified atrial fibrillation: Secondary | ICD-10-CM

## 2023-10-08 DIAGNOSIS — I2510 Atherosclerotic heart disease of native coronary artery without angina pectoris: Secondary | ICD-10-CM | POA: Insufficient documentation

## 2023-10-08 DIAGNOSIS — R42 Dizziness and giddiness: Secondary | ICD-10-CM | POA: Diagnosis present

## 2023-10-08 DIAGNOSIS — I120 Hypertensive chronic kidney disease with stage 5 chronic kidney disease or end stage renal disease: Secondary | ICD-10-CM | POA: Insufficient documentation

## 2023-10-08 DIAGNOSIS — Z79899 Other long term (current) drug therapy: Secondary | ICD-10-CM | POA: Insufficient documentation

## 2023-10-08 DIAGNOSIS — R0981 Nasal congestion: Secondary | ICD-10-CM | POA: Insufficient documentation

## 2023-10-08 DIAGNOSIS — Z951 Presence of aortocoronary bypass graft: Secondary | ICD-10-CM | POA: Insufficient documentation

## 2023-10-08 DIAGNOSIS — Z992 Dependence on renal dialysis: Secondary | ICD-10-CM | POA: Diagnosis not present

## 2023-10-08 DIAGNOSIS — I6200 Nontraumatic subdural hemorrhage, unspecified: Secondary | ICD-10-CM | POA: Diagnosis not present

## 2023-10-08 LAB — CBC
HCT: 30.8 % — ABNORMAL LOW (ref 36.0–46.0)
Hemoglobin: 9.4 g/dL — ABNORMAL LOW (ref 12.0–15.0)
MCH: 32 pg (ref 26.0–34.0)
MCHC: 30.5 g/dL (ref 30.0–36.0)
MCV: 104.8 fL — ABNORMAL HIGH (ref 80.0–100.0)
Platelets: 138 10*3/uL — ABNORMAL LOW (ref 150–400)
RBC: 2.94 MIL/uL — ABNORMAL LOW (ref 3.87–5.11)
RDW: 26.2 % — ABNORMAL HIGH (ref 11.5–15.5)
WBC: 4.3 10*3/uL (ref 4.0–10.5)
nRBC: 0 % (ref 0.0–0.2)

## 2023-10-08 MED ORDER — DOXYCYCLINE HYCLATE 100 MG PO TABS
100.0000 mg | ORAL_TABLET | Freq: Once | ORAL | Status: AC
  Administered 2023-10-08: 100 mg via ORAL
  Filled 2023-10-08: qty 1

## 2023-10-08 MED ORDER — METOPROLOL TARTRATE 25 MG PO TABS
12.5000 mg | ORAL_TABLET | Freq: Two times a day (BID) | ORAL | 0 refills | Status: DC
Start: 2023-10-08 — End: 2023-11-14

## 2023-10-08 MED ORDER — AMIODARONE HCL 200 MG PO TABS: ORAL_TABLET | ORAL | 0 refills | Status: DC

## 2023-10-08 NOTE — ED Triage Notes (Signed)
 Pt c/o runny nose, congestin since getting home from hospital. Also endorses chest tightness.

## 2023-10-08 NOTE — ED Provider Notes (Signed)
 [place] EMERGENCY DEPARTMENT AT [facility] Provider Note   CSN: [id number] Arrival date & time: 10/08/23  2029     History  Chief Complaint  Patient presents with   Nasal Congestion    Shawna [name] is a 83 y.o. female.  Patient is an 83 year old female with extensive past medical history including end-stage renal disease on hemodialysis, coronary artery disease status post CABG, hypertension, GERD, hyperlipidemia.  Patient presenting today for evaluation of nasal congestion.  She just left the hospital this morning after being admitted for A-fib with RVR.  Patient returns this evening stating that she is now having nasal congestion.  She denies any fevers or chills.  She denies chest pain or productive cough.       Home Medications Prior to Admission medications   Medication Sig Start Date End Date Taking? Authorizing Provider  amiodarone (PACERONE) 200 MG tablet Take 2 tablets (400 mg total) by mouth 2 (two) times daily for 7 days, THEN 1 tablet (200 mg total) 2 (two) times daily for 23 days. 10/08/23 11/07/23  Tyrone Nine, MD  calcitRIOL (ROCALTROL) 0.25 MCG capsule Take 7 capsules (1.75 mcg total) by mouth every Monday, Wednesday, and Friday with hemodialysis. 12/21/22   Lanae Boast, MD  cinacalcet (SENSIPAR) 30 MG tablet Take 1 tablet (30 mg total) by mouth every Monday, Wednesday, and Friday with hemodialysis. 12/19/22   Lanae Boast, MD  cyanocobalamin (VITAMIN B12) 1000 MCG/ML injection Inject 1,000 mcg into the muscle every 30 (thirty) days.    [provider]  famotidine (PEPCID) 20 MG tablet Take 1 tablet (20 mg total) by mouth daily as needed for heartburn or indigestion. 09/10/23 09/09/24  Johnson, Clanford L, MD  fluticasone (FLONASE) 50 MCG/ACT nasal spray Place 2 sprays into both nostrils daily. 09/19/23   [provider]  folic acid (FOLVITE) 1 MG tablet Take 1 mg by mouth daily. 05/24/22   [provider]  hydrOXYzine (ATARAX) 25 MG  tablet Take 25 mg by mouth every 6 (six) hours as needed for itching. 06/06/22   [provider]  levETIRAcetam (KEPPRA) 500 MG tablet Take 1 tablet (500 mg total) by mouth daily. 11/15/22   Leroy Sea, MD  levothyroxine (SYNTHROID) 50 MCG tablet Take 50 mcg by mouth daily. 08/27/23   [provider]  metoprolol tartrate (LOPRESSOR) 25 MG tablet Take 0.5 tablets (12.5 mg total) by mouth 2 (two) times daily. 10/08/23   Tyrone Nine, MD  multivitamin (RENA-VIT) TABS tablet Take 1 tablet by mouth daily.    [provider]  nitroGLYCERIN (NITROSTAT) 0.4 MG SL tablet Place 0.4 mg under the tongue every 5 (five) minutes as needed for chest pain.    [provider]  octreotide (SANDOSTATIN LAR) 20 MG injection Inject 20 mg into the muscle every 28 (twenty-eight) days. 11/15/22   Leroy Sea, MD  ondansetron (ZOFRAN-ODT) 4 MG disintegrating tablet Take 4 mg by mouth every 8 (eight) hours as needed for nausea or vomiting. 05/22/23   [provider]  rosuvastatin (CRESTOR) 10 MG tablet Take 1 tablet by mouth once daily 06/06/22   [place] Ouch, [place] M, PA-C      Allergies    Amlodipine, Aspirin, Nitrofurantoin, Ranexa [ranolazine], Bactrim [sulfamethoxazole-trimethoprim], Iodinated contrast media, Iron, Tylenol [acetaminophen], Gabapentin, Sucroferric oxyhydroxide, Levaquin [levofloxacin in d5w], Levofloxacin, Plavix [clopidogrel bisulfate], Protonix [pantoprazole sodium], and Venofer [ferric oxide]    Review of Systems   Review of Systems  All other systems reviewed and  are negative.   Physical Exam Updated Vital Signs BP 116/69   Pulse 98   Temp 98.3 F (36.8 C) (Oral)   Resp 20   SpO2 95%  Physical Exam Vitals and nursing note reviewed.  Constitutional:      General: She is not in acute distress.    Appearance: She is well-developed. She is not diaphoretic.  HENT:     Head: Normocephalic and atraumatic.  Cardiovascular:     Rate and  Rhythm: Normal rate and regular rhythm.     Heart sounds: No murmur heard.    No friction rub. No gallop.  Pulmonary:     Effort: Pulmonary effort is normal. No respiratory distress.     Breath sounds: Normal breath sounds. No wheezing.  Abdominal:     General: Bowel sounds are normal. There is no distension.     Palpations: Abdomen is soft.     Tenderness: There is no abdominal tenderness.  Musculoskeletal:        General: Normal range of motion.     Cervical back: Normal range of motion and neck supple.  Skin:    General: Skin is warm and dry.  Neurological:     General: No focal deficit present.     Mental Status: She is alert and oriented to person, place, and time.     ED Results / Procedures / Treatments   Labs (all labs ordered are listed, but only abnormal results are displayed) Labs Reviewed - No data to display  EKG EKG Interpretation Date/Time:  Sunday October 08 2023 20:56:32 EDT Ventricular Rate:  112 PR Interval:  151 QRS Duration:  145 QT Interval:  403 QTC Calculation: 551 R Axis:   -8  Text Interpretation: Second degree AV block, Mobitz II Probable left atrial enlargement Left bundle branch block No significant change since 10/05/2023 Confirmed by Geoffery Lyons ([id number]) on 10/08/2023 11:36:48 PM  Radiology No results found.  Procedures Procedures    Medications Ordered in ED Medications  doxycycline (VIBRA-TABS) tablet 100 mg (has no administration in time range)    ED Course/ Medical Decision Making/ A&P  Patient presenting here with nasal congestion as described in the HPI.  I suspect a viral etiology, but could represent a sinus infection.  Given the patient's extensive past medical history, I feel as though an antibiotic is reasonable.  Patient will be given doxycycline and is to follow-up as needed.  She is scheduled for dialysis tomorrow, but today everything appears well.  Her lungs are clear and blood pressure is normal.  She does not appear  volume overloaded.  Patient had labs yesterday morning, I do not feel as though any of these need to be repeated at this time.  She is to follow-up with dialysis and return as needed.  Final Clinical Impression(s) / ED Diagnoses Final diagnoses:  None    Rx / DC Orders ED Discharge Orders     None         Geoffery Lyons, MD 10/08/23 2341

## 2023-10-08 NOTE — Discharge Summary (Signed)
 Physician Discharge Summary   Patient: Shawna [name] MRN: [id number] DOB: 1940-10-10  Admit date:     10/03/2023  Discharge date: 10/08/23  Discharge Physician: Tyrone Nine   PCP: [facility]   Recommendations at discharge:  Follow up with PCP in 1-2 weeks. Recommend rechecking TSH, free T3, free T4 in 4 weeks and adjusting synthroid dose accordingly.  Follow up with cardiology for paroxysmal atrial fibrillation. Restarted amiodarone and restarted decreased metoprolol due to hypotension and poorly tolerating atrial fibrillation. Requires regular CBC +/- transfusion for chronic blood loss anemia due to GI tract AVMs. Also unexplained macrocytosis for which hematology evaluation can be considered. Follow up with GI as per routine, the recommend continuing octreotide, and will arrange f/u in 3-4 weeks. Not hx allergy to PPI. Continue routine HD MWF, next session will be as outpatient on 3/31.   Discharge Diagnoses: Principal Problem:   Atrial fibrillation with rapid ventricular response (HCC) Active Problems:   Macrocytic anemia   End-stage renal disease on hemodialysis (HCC)   Essential hypertension   Paroxysmal atrial fibrillation (HCC)   Acquired hypothyroidism   Mixed hyperlipidemia   Chronic combined systolic and diastolic CHF (congestive heart failure) (HCC)   Anemia   Hypoalbuminemia due to protein-calorie malnutrition (HCC)   Hypokalemia   GI bleed  Hospital Course: Shawna [name] is an 83 y.o. female with a history of AVMs with recurrent GI bleeding, anemia, HFrEF, CAD s/p CABG on aspirin 81mg , PAF, ESRD on HD MWF, T2DM, and colon CA s/p resection 1992 who presented to the ED on 10/03/2023 with rectal bleeding, suspected to be due to recurrent GI bleeding from AVMs. She was admitted for observation, ultimately underwent transfusion of 1u RBCs, though has not had gross bleeding so no GI invasive evaluation has been pursued. Hospitalization was complicated by    Assessment and Plan: Acute on chronic blood loss anemia due to recurrent AVM-related GI bleeding: Clinically BRBPR has stopped, hgb up as anticipated after 1u RBCs 3/27.  - Continue monitor H/H, hgb stable. Macrocytosis etiology unclear, though dates to ~2015. No B12/folic acid deficiency, LFTs ok, not on MTX/hydrea. She does have hypothyroidism, though this is being treated. Had no M spike observed on SPEP in 2022. ?If MDS is a concern, could consider BM biopsy after discharge - Holding aspirin since 2024, continue to avoid anticoagulation.  - GI will arrange outpatient follow up, no plan for repeat endoscopic evaluation. Continue monthly octreotide after discharge. Pt has allergy to protonix.    PAF with RVR: She has converted frequently between sinus and afib rhythms, seems quite symptomatic (retching, confused, at times lethargic) when in rapid AFib.  - Per previous note, BB and amiodarone were held due to symptomatic bradycardia in the past. No bradycardia currently noted, in fact she's gone into rapid atrial fibrillation several times and tolerated poorly. In effort at rhythm control with minimizing bradycardia risk, we have restarted amiodarone, will complete short loading dose 400mg  BID x1 week, then 200mg  BID with plans for cardiology follow up. Will also continue metoprolol at 12.5mg  BID dose. No bradycardia has been noted on this regimen. - Not on CCB with mildly depressed LVEF    Acute metabolic encephalopathy on chronic dementia: Pleasantly confused most of the time, intermittently drowsy and less oriented than baseline. This appears to be due to delirium and worse with atrial fibrillation.  - Monitor closely, treat as above, delirium precautions.    ESRD:  - Continue routine HD MWF per nephrology  Hypothyroidism: TSH 7.372 though free T4 also elevated at 1.45. - Continue synthroid, not several different doses recently of this medication ( > > ). Plan to continue  PTA dose of (especially in light of AFib with RVR when dose increased to ) and repeat TFTs at follow up.   Chronic combined HFrEF, CAD, HTN, HLD: hx CABG, no chest pain. Last echo Feb 2025 w/LVEF 45-50%, G2DD.  - Continue metoprolol, rosuvastatin. Holding hydralazine while intermittently hypotensive.  - Volume management with HD.   Hypokalemia: Resolved, tx with HD.  Consultants: Nephrology, GI Procedures performed: Hemodialysis  Disposition: Home Diet recommendation: Renal DISCHARGE MEDICATION: Allergies as of 10/08/2023       Reactions   Amlodipine Swelling   Aspirin Other (See Comments)   High Doses Mess up her stomach; "makes my bowels have blood in them". Takes 81 mg EC Aspirin    Nitrofurantoin Hives   Ranexa [ranolazine] Other (See Comments)   Myoclonus-hospitalized    Bactrim [sulfamethoxazole-trimethoprim] Rash   Iodinated Contrast Media Itching, Other (See Comments)   Iron Itching, Other (See Comments)   "they gave me iron in dialysis; had to give me Benadryl cause I had to have the iron" (05/02/2012)   Tylenol [acetaminophen] Itching, Other (See Comments)   Makes her feet on fire per pt - can tolerate per pt   Gabapentin Other (See Comments)   Unknown reaction   Sucroferric Oxyhydroxide Other (See Comments)   Unknown   Levaquin [levofloxacin In D5w] Rash   Levofloxacin Rash, Other (See Comments)   Plavix [clopidogrel Bisulfate] Rash   Protonix [pantoprazole Sodium] Rash   Venofer [ferric Oxide] Itching, Other (See Comments)   Patient reports using Benadryl prior to doses as [facility]        Medication List     STOP taking these medications    hydrALAZINE 25 MG tablet Commonly known as: APRESOLINE       TAKE these medications    amiodarone 200 MG tablet Commonly known as: PACERONE Take 2 tablets (400 mg total) by mouth 2 (two) times daily for 7 days, THEN 1 tablet (200 mg total) 2 (two) times daily for 23 days. Start taking on:  October 08, 2023   calcitRIOL 0.25 MCG capsule Commonly known as: ROCALTROL Take 7 capsules (1.75 mcg total) by mouth every Monday, Wednesday, and Friday with hemodialysis.   cinacalcet 30 MG tablet Commonly known as: SENSIPAR Take 1 tablet (30 mg total) by mouth every Monday, Wednesday, and Friday with hemodialysis.   cyanocobalamin 1000 MCG/ML injection Commonly known as: VITAMIN B12 Inject 1,000 mcg into the muscle every 30 (thirty) days.   famotidine 20 MG tablet Commonly known as: Pepcid Take 1 tablet (20 mg total) by mouth daily as needed for heartburn or indigestion.   fluticasone 50 MCG/ACT nasal spray Commonly known as: FLONASE Place 2 sprays into both nostrils daily.   folic acid 1 MG tablet Commonly known as: FOLVITE Take 1 mg by mouth daily.   hydrOXYzine 25 MG tablet Commonly known as: ATARAX Take 25 mg by mouth every 6 (six) hours as needed for itching.   levETIRAcetam 500 MG tablet Commonly known as: KEPPRA Take 1 tablet (500 mg total) by mouth daily.   levothyroxine 50 MCG tablet Commonly known as: SYNTHROID Take 50 mcg by mouth daily.   metoprolol tartrate 25 MG tablet Commonly known as: LOPRESSOR Take 0.5 tablets (12.5 mg total) by mouth 2 (two) times daily.   multivitamin Tabs tablet Take  1 tablet by mouth daily.   nitroGLYCERIN 0.4 MG SL tablet Commonly known as: NITROSTAT Place 0.4 mg under the tongue every 5 (five) minutes as needed for chest pain.   ondansetron 4 MG disintegrating tablet Commonly known as: ZOFRAN-ODT Take 4 mg by mouth every 8 (eight) hours as needed for nausea or vomiting.   rosuvastatin 10 MG tablet Commonly known as: CRESTOR Take 1 tablet by mouth once daily   SandoSTATIN LAR [place] 20 MG injection Generic drug: octreotide Inject 20 mg into the muscle every 28 (twenty-eight) days.        Follow-up Information     [facility] Follow up.   Contact information: [address] [place]  [address] [phone]         Antoine Poche, MD. Schedule an appointment as soon as possible for a visit.   Specialty: Cardiology Contact information: [address] [place] [address] [phone]         [facility] Follow up.   Contact information: [address] [place] [address] [phone]               Discharge Exam: Filed Weights   10/06/23 1353 10/07/23 0500 10/08/23 0500  Weight: 51.2 kg 51.2 kg 57.3 kg  BP 100/70   Pulse 80   Temp 98.7 F (37.1 C) (Oral)   Resp 18   Ht 5\' 2"  (1.575 m)   Wt 57.3 kg   SpO2 95%   BMI 23.10 kg/m   Elderly pleasant, interactive female in no distress Clear, nonlabored RRR, rate in 70's to 80's this AM, NSR overnight. Stable II/VI systolic murmur at LSB, no edema Soft, NT, ND, +BS Left TDC site appears normal without erythema, discharge or tenderness.   Condition at discharge: stable  The results of significant diagnostics from this hospitalization (including imaging, microbiology, ancillary and laboratory) are listed below for reference.   Imaging Studies: DG Abd Portable 1V Result Date: 10/06/2023 CLINICAL DATA:  Abdominal pain and vomiting. EXAM: PORTABLE ABDOMEN - 1 VIEW COMPARISON:  CT 04/24/2023, chest radiograph 09/20/2023 FINDINGS: No bowel dilatation to suggest obstruction. Small volume of formed stool throughout the colon. Multiple vascular calcifications. Pelvic surgical clips. Bilateral pleural effusions and bibasilar volume loss, similar to prior chest radiograph. IMPRESSION: 1. Nonobstructive bowel gas pattern. Small volume of formed stool throughout the colon. 2. Unchanged bilateral pleural effusions and bibasilar volume loss. Electronically Signed   By: Narda Rutherford M.D.   On: 10/06/2023 21:15   DG Chest Portable 1 View Result Date: 09/20/2023 CLINICAL DATA:  Weakness. EXAM: PORTABLE CHEST 1 VIEW COMPARISON:  Chest radiograph dated 09/16/2023.  FINDINGS: Dialysis catheter in similar position. Shallow inspiration. Small bilateral pleural effusions and bibasilar atelectasis. Pneumonia is not excluded no pneumothorax. Stable cardiac silhouette. Median sternotomy wires and CABG vascular clips. No acute osseous pathology. IMPRESSION: Small bilateral pleural effusions and bibasilar atelectasis. Electronically Signed   By: Elgie Collard M.D.   On: 09/20/2023 18:24   DG Chest Portable 1 View Result Date: 09/16/2023 CLINICAL DATA:  Weakness EXAM: PORTABLE CHEST 1 VIEW COMPARISON:  September 08, 2023 FINDINGS: The cardiomediastinal silhouette is unchanged and enlarged in contour.Status post median sternotomy and CABG. LEFT chest CVC with tip terminating over the RIGHT atrium. LEFT axillary stent. Moderate bilateral pleural effusions, increased since prior. No pneumothorax. Background of interstitial prominence. Bibasilar airspace opacities. Atherosclerotic calcifications. IMPRESSION: 1. Moderate bilateral pleural effusions, increased since prior. 2. Background of pulmonary edema with bibasilar  airspace opacities, likely atelectasis. Electronically Signed   By: Meda Klinefelter M.D.   On: 09/16/2023 16:56   CT Head Wo Contrast Result Date: 09/08/2023 CLINICAL DATA:  Altered mental status. EXAM: CT HEAD WITHOUT CONTRAST TECHNIQUE: Contiguous axial images were obtained from the base of the skull through the vertex without intravenous contrast. RADIATION DOSE REDUCTION: This exam was performed according to the departmental dose-optimization program which includes automated exposure control, adjustment of the mA and/or kV according to patient size and/or use of iterative reconstruction technique. COMPARISON:  Head CT dated 04/10/2023. FINDINGS: Evaluation of this exam is limited due to motion artifact. Brain: Moderate age-related atrophy and chronic microvascular ischemic changes. There is no acute intracranial hemorrhage. No mass effect or midline shift. No  extra-axial fluid collection. Vascular: No hyperdense vessel or unexpected calcification. Skull: Normal. Negative for fracture or focal lesion. Sinuses/Orbits: There is mucoperiosteal thickening of paranasal sinuses. No air-fluid level. The mastoid air cells are clear. Other: None IMPRESSION: 1. No acute intracranial pathology. 2. Moderate age-related atrophy and chronic microvascular ischemic changes. Electronically Signed   By: Elgie Collard M.D.   On: 09/08/2023 18:42   DG Chest Port 1 View Result Date: 09/08/2023 CLINICAL DATA:  Weakness entire.  Bradycardia EXAM: PORTABLE CHEST 1 VIEW COMPARISON:  X-ray 08/19/2023 FINDINGS: Enlarged cardiopericardial silhouette with sternal wires. Double-lumen left IJ catheter with tip overlying the right atrium. Small bilateral pleural effusions with adjacent opacities. The opacities are increased from previous. No pneumothorax. Vascular gesture with some interstitial changes, possibly edema. Fluid tracks along the minor fissure. Overlapping cardiac leads. Vascular stent along the left upper extremity at the edge of the imaging field. Patient is tilted IMPRESSION: Enlarged heart with vascular congestion and trace edema. Small bilateral pleural effusions. Lung base opacities are seen, increased on the right. Left IJ double-lumen catheter with tip overlying the right atrium. Electronically Signed   By: Karen Kays M.D.   On: 09/08/2023 16:14    Microbiology: Results for orders placed or performed during the hospital encounter of 10/03/23  MRSA Next Gen by PCR, Nasal     Status: None   Collection Time: 10/05/23  3:10 PM   Specimen: Nasal Mucosa; Nasal Swab  Result Value Ref Range Status   MRSA by PCR Next Gen NOT DETECTED NOT DETECTED Final    Comment: (NOTE) The GeneXpert MRSA Assay (FDA approved for NASAL specimens only), is one component of a comprehensive MRSA colonization surveillance program. It is not intended to diagnose MRSA infection nor to guide or  monitor treatment for MRSA infections. Test performance is not FDA approved in patients less than 29 years old. Performed at [facility], [address], [place], [place] [address]    *Note: Due to a large number of results and/or encounters for the requested time period, some results have not been displayed. A complete set of results can be found in Results Review.    Labs: CBC: Recent Labs  Lab 10/03/23 2035 10/04/23 1115 10/05/23 0730 10/05/23 2156 10/06/23 1115 10/07/23 0727 10/08/23 0439  WBC 4.3 3.9*  --   --  4.3 5.2 4.3  HGB 8.3* 7.4* 7.7* 9.9* 9.7* 10.3* 9.4*  HCT 26.4* 23.4* 23.8* 30.8* 30.2* 33.4* 30.8*  MCV 107.3* 107.3*  --   --  101.0* 102.8* 104.8*  PLT 168 153  --   --  173 164 138*   Basic Metabolic Panel: Recent Labs  Lab 10/03/23 2035 10/04/23 1115 10/06/23 1115  NA 140 141 135  K  3.2* 3.6 3.7  CL 102 103 98  CO2 29 26 25   GLUCOSE 154* 72 202*  BUN 21 21 25*  CREATININE 3.95* 4.56* 4.86*  CALCIUM 8.5* 8.3* 8.0*  MG  --  1.6* 2.0  PHOS  --  3.6 3.2   Liver Function Tests: Recent Labs  Lab 10/03/23 2035 10/04/23 1115 10/06/23 1115  AST 32 26  --   ALT 20 17  --   ALKPHOS 182* 158*  --   BILITOT 0.9 1.2  --   PROT 6.9 6.2*  --   ALBUMIN 3.0* 2.7* 2.7*   CBG: Recent Labs  Lab 10/05/23 1508  GLUCAP 155*    Discharge time spent: greater than 30 minutes.  Signed: Tyrone Nine, MD [facility] 10/08/2023

## 2023-10-08 NOTE — Plan of Care (Signed)

## 2023-10-08 NOTE — Discharge Instructions (Signed)
Begin taking doxycycline as prescribed.  Take over-the-counter medications as needed for relief of symptoms.  Return to the ER if symptoms significantly worsen or change.

## 2023-10-09 ENCOUNTER — Emergency Department (HOSPITAL_COMMUNITY)

## 2023-10-09 ENCOUNTER — Other Ambulatory Visit: Payer: Self-pay

## 2023-10-09 ENCOUNTER — Encounter (HOSPITAL_COMMUNITY): Payer: Self-pay

## 2023-10-09 ENCOUNTER — Emergency Department (HOSPITAL_COMMUNITY)
Admission: EM | Admit: 2023-10-09 | Discharge: 2023-10-09 | Disposition: A | Discharge status: Home / Self Care | Source: Home / Self Care | Attending: Emergency Medicine | Admitting: Emergency Medicine

## 2023-10-09 DIAGNOSIS — I6200 Nontraumatic subdural hemorrhage, unspecified: Secondary | ICD-10-CM | POA: Insufficient documentation

## 2023-10-09 DIAGNOSIS — R42 Dizziness and giddiness: Secondary | ICD-10-CM

## 2023-10-09 DIAGNOSIS — G9608 Other cranial cerebrospinal fluid leak: Secondary | ICD-10-CM

## 2023-10-09 LAB — BASIC METABOLIC PANEL WITH GFR
Anion gap: 16 — ABNORMAL HIGH (ref 5–15)
BUN: 36 mg/dL — ABNORMAL HIGH (ref 8–23)
CO2: 18 mmol/L — ABNORMAL LOW (ref 22–32)
Calcium: 7.4 mg/dL — ABNORMAL LOW (ref 8.9–10.3)
Chloride: 102 mmol/L (ref 98–111)
Creatinine, Ser: 6.08 mg/dL — ABNORMAL HIGH (ref 0.44–1.00)
GFR, Estimated: 6 mL/min — ABNORMAL LOW (ref >60)
Glucose, Bld: 116 mg/dL — ABNORMAL HIGH (ref 70–99)
Potassium: 4.1 mmol/L (ref 3.5–5.1)
Sodium: 136 mmol/L (ref 135–145)

## 2023-10-09 MED ORDER — DOXYCYCLINE HYCLATE 100 MG PO CAPS: 100.0000 mg | ORAL_CAPSULE | Freq: Two times a day (BID) | ORAL | 0 refills | Status: DC

## 2023-10-09 NOTE — Discharge Instructions (Signed)
 We evaluated you for your dizziness.  Your testing in the emergency department is reassuring.  Your MRI did not show any signs of a stroke.  Your MRI did show a small thin layer of blood on the outside of your brain.  We discussed this with the neurosurgeon.  They do not think this is anything dangerous.  It could be related to a recent fall, although you do not remember any falls.  Please follow-up closely with your primary doctor.  Please call your dialysis center tomorrow.  Please see if they can dialyze you tomorrow.  Return to the emergency department if you develop any new symptoms such as shortness of breath, lightheadedness or dizziness, fainting, fevers, chills, or any other new symptoms.

## 2023-10-09 NOTE — ED Notes (Signed)
Pt states she no longer makes urine

## 2023-10-09 NOTE — ED Provider Notes (Signed)
 [place] EMERGENCY DEPARTMENT AT [facility] Provider Note  CSN: [id number] Arrival date & time: 10/09/23 1325  Chief Complaint(s) Dizziness  HPI Shawna [name] is a 83 y.o. female with history of end-stage renal disease on dialysis, last Friday, CHF, coronary artery disease, presenting to the emergency department with dizziness.  Patient reports that this morning, she was feeling very dizzy.  Describes as a room spinning sensation.  No clear modifying factors.  Not sure if she had trouble walking.  No nausea or vomiting.  This lasted for some time and then resolved.  No numbness or tingling, weakness, chest pain, shortness of breath, abdominal pain, other new symptoms.  She went to her dialysis center today but they were not able to perform dialysis due to her symptoms.  Husband reports that they did not start dialysis and just told her to go home.  She denies any shortness of breath.  Reports some mild generalized weakness.   Past Medical History Past Medical History:  Diagnosis Date   Acute on chronic respiratory failure with hypoxia (HCC) 10/10/2016   Anxiety    Arthritis    AVM (arteriovenous malformation) of colon    CAD (coronary artery disease)    a. s/p CABG in 2013 b. DES to D1 in 10/2016. c. cath in 07/2018 showing patent grafts with occlusion of D1 at prior stent site and progression of PDA disease --> medical management recommended   Carotid artery disease (HCC)    a. 60-79% LICA, 03/2012    Chronic bronchitis (HCC)    Chronic HFrEF (heart failure with reduced ejection fraction) (HCC)    Colon cancer (HCC) 1992   Esophageal stricture    ESRD on hemodialysis (HCC)    ESRD due to HTN, started dialysis 2011 and gets HD at [facility] with Dr Fausto Skillern on MWF schedule.  Access is LUA AVF as of Sept 2014.    GERD (gastroesophageal reflux disease)    High cholesterol 12/2011   History of blood transfusion 07/2011; 12/2011; 01/2012 X 2; 04/2012   History of gout    History  of lower GI bleeding    Hypertension    Iron deficiency anemia    Jugular vein occlusion, right (HCC)    Mitral regurgitation    a. Moderate by echo, 02/2012   Mitral valve disease    NSVT (nonsustained ventricular tachycardia) (HCC)    Ovarian cancer (HCC) 1992   PAF (paroxysmal atrial fibrillation) (HCC)    Pneumonia ~ 2009   PUD (peptic ulcer disease)    TIA (transient ischemic attack)    Tricuspid valve disease    Patient Active Problem List   Diagnosis Date Noted   Atrial fibrillation with rapid ventricular response (HCC) 10/07/2023   GI bleed 10/03/2023   Hypoxia 08/24/2023   Severe sepsis (HCC) 08/20/2023   Pleural effusion 08/20/2023   Multifocal pneumonia 08/19/2023   Bradycardia 08/05/2023   Generalized weakness 07/12/2023   Sinus bradycardia 07/11/2023   FTT (failure to thrive) in adult 06/04/2023   Symptomatic anemia 03/08/2023   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   Acute blood loss anemia 01/27/2023   Paroxysmal atrial flutter (HCC) 12/14/2022   Pressure injury of skin 12/01/2022   Rectal bleeding 11/30/2022   Generalized abdominal pain 11/10/2022   Ileus (HCC) 11/04/2022   Periumbilical abdominal pain 11/04/2022   Macrocytic anemia 10/29/2022   Anemia of chronic disease 10/29/2022   Upper GI bleed 10/29/2022   Iron deficiency anemia due to chronic blood  loss 08/26/2022   Benign neoplasm of transverse colon 08/11/2022   Benign neoplasm of descending colon 08/11/2022   Benign neoplasm of sigmoid colon 08/11/2022   Angiodysplasia of duodenum 08/11/2022   Delirium 04/22/2022   History of arteriovenous malformation (AVM) 04/22/2022   Acute metabolic encephalopathy 11/09/2021   NSTEMI, initial episode of care (HCC) 10/19/2021   Acute on chronic blood loss anemia 09/09/2021   Ischemic cardiomyopathy    Atrial fibrillation, chronic (HCC) 05/07/2021   Neuropathy 04/23/2021   Anemia associated with chronic renal failure 04/13/2021   Left knee  pain 03/15/2021   Moderate protein-calorie malnutrition (HCC) 02/27/2021   Hypokalemia 02/27/2021   COVID-19 virus infection 02/03/2021   Leukocytosis 02/03/2021   Hypoalbuminemia due to protein-calorie malnutrition (HCC) 02/03/2021   Acquired hypothyroidism 02/03/2021   Myositis 12/03/2020   Ataxia 12/02/2020   Diabetes mellitus type 2 in nonobese (HCC) 11/10/2020   Failure to thrive in adult 11/09/2020   Dialysis AV fistula malfunction, initial encounter (HCC)    Jugular vein occlusion, right (HCC)    Failure of surgically constructed arteriovenous fistula (HCC) 10/03/2020   Myoclonus 08/31/2020   Clotted renal dialysis AV graft, initial encounter (HCC)    Hemodialysis-associated hypotension    Irritable bowel syndrome 02/25/2020   Pulmonary edema 10/14/2019   Adenomatous duodenal polyp 09/10/2019   History of GI bleed 09/10/2019   Acute respiratory failure with hypoxia (HCC) 12/25/2018   Elevated troponin 12/14/2018   Hand steal syndrome (HCC) 08/01/2017   Anemia 07/14/2017   CAD (coronary artery disease) 06/05/2017   Intestinal ischemia (HCC)    Complication of vascular access for dialysis 03/19/2017   Preoperative clearance 01/25/2017   H/O non-ST elevation myocardial infarction (NSTEMI) 10/24/2016   Non-ST elevation (NSTEMI) myocardial infarction [facility])    Heme positive stool    Cardiac arrest [facility])    Palliative care encounter    Goals of care, counseling/discussion    Flash pulmonary edema (HCC) 04/06/2016   History of colon cancer 01/27/2016   History of ovarian cancer 01/27/2016   Paroxysmal atrial fibrillation (HCC) 10/14/2015   Malignant neoplasm of right ovary (HCC) 10/14/2015   Wide-complex tachycardia 09/08/2015   SVT (supraventricular tachycardia) (HCC) 09/08/2015   Hypertensive urgency 05/04/2015   Essential hypertension    Dyspnea    Constipation 03/12/2013   Abdominal wall pain in left flank 03/12/2013   Occlusion and stenosis of carotid artery without  mention of cerebral infarction 01/24/2013   Hx of CABG 07/05/2012   Carotid artery disease (HCC) 07/05/2012   Mitral regurgitation 06/12/2012   Non-STEMI (non-ST elevated myocardial infarction) (HCC) 06/08/2012   Chronic combined systolic and diastolic CHF (congestive heart failure) (HCC) 05/02/2012   AVM (arteriovenous malformation) of small bowel, acquired with hemorrhage 01/20/2012   GERD (gastroesophageal reflux disease) 01/09/2012   Mixed hyperlipidemia 01/05/2012   Atherosclerotic heart disease of native coronary artery without angina pectoris 12/16/2011   ESRD on hemodialysis (HCC) 12/16/2011   Anxiety disorder 05/04/2011   End-stage renal disease on hemodialysis (HCC) 04/29/2011   Gout 04/29/2011   Home Medication(s) Prior to Admission medications   Medication Sig Start Date End Date Taking? Authorizing Provider  amiodarone (PACERONE) 200 MG tablet Take 2 tablets (400 mg total) by mouth 2 (two) times daily for 7 days, THEN 1 tablet (200 mg total) 2 (two) times daily for 23 days. 10/08/23 11/07/23  Tyrone Nine, MD  calcitRIOL (ROCALTROL) 0.25 MCG capsule Take 7 capsules (1.75 mcg total) by mouth every Monday, Wednesday, and Friday  with hemodialysis. 12/21/22   Lanae Boast, MD  cinacalcet (SENSIPAR) 30 MG tablet Take 1 tablet (30 mg total) by mouth every Monday, Wednesday, and Friday with hemodialysis. 12/19/22   Lanae Boast, MD  cyanocobalamin (VITAMIN B12) 1000 MCG/ML injection Inject 1,000 mcg into the muscle every 30 (thirty) days.    [provider]  doxycycline (VIBRAMYCIN) 100 MG capsule Take 1 capsule (100 mg total) by mouth 2 (two) times daily. One po bid x 7 days 10/09/23   Geoffery Lyons, MD  famotidine (PEPCID) 20 MG tablet Take 1 tablet (20 mg total) by mouth daily as needed for heartburn or indigestion. 09/10/23 09/09/24  Johnson, Clanford L, MD  fluticasone (FLONASE) 50 MCG/ACT nasal spray Place 2 sprays into both nostrils daily. 09/19/23   [provider]  folic  acid (FOLVITE) 1 MG tablet Take 1 mg by mouth daily. 05/24/22   [provider]  hydrOXYzine (ATARAX) 25 MG tablet Take 25 mg by mouth every 6 (six) hours as needed for itching. 06/06/22   [provider]  levETIRAcetam (KEPPRA) 500 MG tablet Take 1 tablet (500 mg total) by mouth daily. 11/15/22   Leroy Sea, MD  levothyroxine (SYNTHROID) 50 MCG tablet Take 50 mcg by mouth daily. 08/27/23   [provider]  metoprolol tartrate (LOPRESSOR) 25 MG tablet Take 0.5 tablets (12.5 mg total) by mouth 2 (two) times daily. 10/08/23   Tyrone Nine, MD  multivitamin (RENA-VIT) TABS tablet Take 1 tablet by mouth daily.    [provider]  nitroGLYCERIN (NITROSTAT) 0.4 MG SL tablet Place 0.4 mg under the tongue every 5 (five) minutes as needed for chest pain.    [provider]  octreotide (SANDOSTATIN LAR) 20 MG injection Inject 20 mg into the muscle every 28 (twenty-eight) days. 11/15/22   Leroy Sea, MD  ondansetron (ZOFRAN-ODT) 4 MG disintegrating tablet Take 4 mg by mouth every 8 (eight) hours as needed for nausea or vomiting. 05/22/23   [provider]  rosuvastatin (CRESTOR) 10 MG tablet Take 1 tablet by mouth once daily 06/06/22   Iran Ouch Lennart Pall, PA-C                                                                                                                                    Past Surgical History Past Surgical History:  Procedure Laterality Date   A/V FISTULAGRAM Left 10/04/2022   Procedure: A/V Fistulagram;  Surgeon: Renford Dills, MD;  Location: [facility] INVASIVE CV LAB;  Service: Cardiovascular;  Laterality: Left;   A/V SHUNTOGRAM Left 03/19/2019   Procedure: A/V SHUNTOGRAM;  Surgeon: Renford Dills, MD;  Location: [facility] INVASIVE CV LAB;  Service: Cardiovascular;  Laterality: Left;   ABDOMINAL HYSTERECTOMY  1992   APPENDECTOMY  06/1990   AV FISTULA PLACEMENT  07/2009   left upper arm   AV FISTULA PLACEMENT Right 09/06/2016    Procedure: RIGHT FOREARM ARTERIOVENOUS (  AV) GRAFT;  Surgeon: Sherren Kerns, MD;  Location: [facility] OR;  Service: Vascular;  Laterality: Right;   AV FISTULA PLACEMENT N/A 02/24/2017   Procedure: INSERTION OF ARTERIOVENOUS (AV) GORE-TEX GRAFT ARM (BRACHIAL AXILLARY);  Surgeon: Renford Dills, MD;  Location: [facility] ORS;  Service: Vascular;  Laterality: N/A;   AVGG REMOVAL Right 09/06/2016   Procedure: REMOVAL OF Right Arm ARTERIOVENOUS GORETEX GRAFT and Vein Patch angioplasty of brachial artery;  Surgeon: Chuck Hint, MD;  Location: [facility] OR;  Service: Vascular;  Laterality: Right;   BIOPSY  09/26/2019   Procedure: BIOPSY;  Surgeon: Malissa Hippo, MD;  [facility];  Service: Endoscopy;;   BIOPSY  11/01/2022   Procedure: BIOPSY;  Surgeon: Dolores Frame, MD;  [facility];  Service: Gastroenterology;;   COLON RESECTION  1992   COLON SURGERY     COLONOSCOPY N/A 03/09/2019   Procedure: COLONOSCOPY;  Surgeon: Malissa Hippo, MD;  [facility];  Service: Endoscopy;  Laterality: N/A;   COLONOSCOPY N/A 08/11/2022   Procedure: COLONOSCOPY;  Surgeon: Beverley Fiedler, MD;  Location: [facility] ENDOSCOPY;  Service: Gastroenterology;  Laterality: N/A;   COLONOSCOPY WITH PROPOFOL N/A 06/08/2021   Procedure: COLONOSCOPY WITH PROPOFOL;  Surgeon: Dolores Frame, MD;  [facility];  Service: Gastroenterology;  Laterality: N/A;  9:05 /Patient is on dialysis Mon Wed Fri   CORONARY ANGIOPLASTY WITH STENT PLACEMENT  12/15/11   "2"   CORONARY ANGIOPLASTY WITH STENT PLACEMENT  y/2013   "1; makes total of 3" (05/02/2012)   CORONARY ARTERY BYPASS GRAFT  06/13/2012   Procedure: CORONARY ARTERY BYPASS GRAFTING (CABG);  Surgeon: Delight Ovens, MD;  Location: [facility] OR;  Service: Open Heart Surgery;  Laterality: N/A;  cabg x four;  using left internal mammary artery, and left leg greater saphenous vein harvested endoscopically   CORONARY STENT INTERVENTION N/A  10/13/2016   Procedure: Coronary Stent Intervention;  Surgeon: Lennette Bihari, MD;  [facility]: [facility] INVASIVE CV LAB;  Service: Cardiovascular;  Laterality: N/A;   DIALYSIS/PERMA CATHETER REMOVAL N/A 04/18/2017   Procedure: DIALYSIS/PERMA CATHETER REMOVAL;  Surgeon: Renford Dills, MD;  Location: [facility] INVASIVE CV LAB;  Service: Cardiovascular;  Laterality: N/A;   DILATION AND CURETTAGE OF UTERUS     ENTEROSCOPY N/A 06/08/2021   Procedure: PUSH ENTEROSCOPY;  Surgeon: Dolores Frame, MD;  [facility];  Service: Gastroenterology;  Laterality: N/A;   ENTEROSCOPY N/A 11/01/2022   Procedure: ENTEROSCOPY;  Surgeon: Dolores Frame, MD;  [facility];  Service: Gastroenterology;  Laterality: N/A;   ENTEROSCOPY N/A 12/01/2022   Procedure: ENTEROSCOPY;  Surgeon: Benancio Deeds, MD;  Location: [facility] ENDOSCOPY;  Service: Gastroenterology;  Laterality: N/A;   ESOPHAGOGASTRODUODENOSCOPY  01/20/2012   Procedure: ESOPHAGOGASTRODUODENOSCOPY (EGD);  Surgeon: Meryl Dare, MD,FACG;  Location: [facility] ENDOSCOPY;  Service: Endoscopy;  Laterality: N/A;   ESOPHAGOGASTRODUODENOSCOPY N/A 03/26/2013   Procedure: ESOPHAGOGASTRODUODENOSCOPY (EGD);  Surgeon: Hilarie Fredrickson, MD;  Location: [facility] ENDOSCOPY;  Service: Endoscopy;  Laterality: N/A;   ESOPHAGOGASTRODUODENOSCOPY N/A 04/30/2015   Procedure: ESOPHAGOGASTRODUODENOSCOPY (EGD);  Surgeon: Malissa Hippo, MD;  [facility];  Service: Endoscopy;  Laterality: N/A;  1pm - moved to 10/20 @ 1:10   ESOPHAGOGASTRODUODENOSCOPY N/A 07/29/2016   Procedure: ESOPHAGOGASTRODUODENOSCOPY (EGD);  Surgeon: Ruffin Frederick, MD;  Location: [facility] ENDOSCOPY;  Service: Gastroenterology;  Laterality: N/A;  enteroscopy   ESOPHAGOGASTRODUODENOSCOPY N/A 09/26/2019   Procedure: ESOPHAGOGASTRODUODENOSCOPY (EGD);  Surgeon: Lionel December  U, MD;  [facility];  Service: Endoscopy;  Laterality: N/A;  1250   ESOPHAGOGASTRODUODENOSCOPY N/A  08/11/2022   Procedure: ESOPHAGOGASTRODUODENOSCOPY (EGD);  Surgeon: Beverley Fiedler, MD;  Location[facility] ENDOSCOPY;  Service: Gastroenterology;  Laterality: N/A;   ESOPHAGOGASTRODUODENOSCOPY (EGD) WITH PROPOFOL N/A 02/05/2021   Procedure: ESOPHAGOGASTRODUODENOSCOPY (EGD) WITH PROPOFOL;  Surgeon: Lanelle Bal, DO;  [facility];  Service: Endoscopy;  Laterality: N/A;   ESOPHAGOGASTRODUODENOSCOPY (EGD) WITH PROPOFOL N/A 08/27/2022   Procedure: ESOPHAGOGASTRODUODENOSCOPY (EGD) WITH PROPOFOL;  Surgeon: Lanelle Bal, DO;  [facility];  Service: Endoscopy;  Laterality: N/A;   GIVENS CAPSULE STUDY N/A 03/07/2019   Procedure: GIVENS CAPSULE STUDY;  Surgeon: Malissa Hippo, MD;  [facility];  Service: Endoscopy;  Laterality: N/A;  7:30   GIVENS CAPSULE STUDY N/A 04/22/2021   Procedure: GIVENS CAPSULE STUDY;  Surgeon: Malissa Hippo, MD;  [facility];  Service: Endoscopy;  Laterality: N/A;  7:30   GIVENS CAPSULE STUDY N/A 08/27/2022   Procedure: GIVENS CAPSULE STUDY;  Surgeon: Lanelle Bal, DO;  [facility];  Service: Endoscopy;  Laterality: N/A;   HOT HEMOSTASIS N/A 08/11/2022   Procedure: HOT HEMOSTASIS (ARGON PLASMA COAGULATION/BICAP);  Surgeon: Beverley Fiedler, MD;  Location: [facility] ENDOSCOPY;  Service: Gastroenterology;  Laterality: N/A;   HOT HEMOSTASIS N/A 12/01/2022   Procedure: HOT HEMOSTASIS (ARGON PLASMA COAGULATION/BICAP);  Surgeon: Benancio Deeds, MD;  Location: [facility] ENDOSCOPY;  Service: Gastroenterology;  Laterality: N/A;   INSERTION OF DIALYSIS CATHETER N/A 10/05/2020   Procedure: ABORTED TUNNELED DIALYSIS CATHETER PLACEMENT RIGHT INTERNAL JUGULAR VEIN ;  Surgeon: Lucretia Roers, MD;  Location: [facility];  Service: General;  Laterality: N/A;   INTRAOPERATIVE TRANSESOPHAGEAL ECHOCARDIOGRAM  06/13/2012   Procedure: INTRAOPERATIVE TRANSESOPHAGEAL ECHOCARDIOGRAM;  Surgeon: Delight Ovens, MD;  Location: [facility] OR;  Service: Open Heart  Surgery;  Laterality: N/A;   IR DIALY SHUNT INTRO NEEDLE/INTRACATH INITIAL W/IMG LEFT Left 10/06/2020   IR FLUORO GUIDE CV LINE RIGHT  06/17/2020   IR FLUORO GUIDE CV LINE RIGHT  11/12/2022   IR GENERIC HISTORICAL  07/26/2016   IR FLUORO GUIDE CV LINE RIGHT 07/26/2016 Berdine Dance, MD [facility]   IR GENERIC HISTORICAL  07/26/2016   IR US GUIDE VASC ACCESS RIGHT 07/26/2016 Berdine Dance, MD [facility]   IR GENERIC HISTORICAL  08/02/2016   IR US GUIDE VASC ACCESS RIGHT 08/02/2016 Berdine Dance, MD [facility]   IR GENERIC HISTORICAL  08/02/2016   IR FLUORO GUIDE CV LINE RIGHT 08/02/2016 Berdine Dance, MD [facility]   IR RADIOLOGY PERIPHERAL GUIDED IV START  03/28/2017   IR REMOVAL TUN CV CATH W/O FL  08/11/2020   IR REMOVAL TUN CV CATH W/O FL  11/15/2022   IR THROMBECTOMY AV FISTULA W/THROMBOLYSIS INC/SHUNT/IMG LEFT Left 06/17/2020   IR US GUIDE VASC ACCESS LEFT  06/17/2020   IR US GUIDE VASC ACCESS RIGHT  03/28/2017   IR US GUIDE VASC ACCESS RIGHT  06/17/2020   IR US GUIDE VASC ACCESS RIGHT  11/12/2022   LEFT HEART CATH AND CORONARY ANGIOGRAPHY N/A 09/20/2016   Procedure: Left Heart Cath and Coronary Angiography;  Surgeon: Lyn Records, MD;  Location: [facility] INVASIVE CV LAB;  Service: Cardiovascular;  Laterality: N/A;   LEFT HEART CATH AND CORS/GRAFTS ANGIOGRAPHY N/A 10/13/2016   Procedure: Left Heart Cath and Cors/Grafts Angiography;  Surgeon: Lennette Bihari, MD;  [facility];  Service: Cardiovascular;  Laterality: N/A;   LEFT HEART CATH AND CORS/GRAFTS ANGIOGRAPHY N/A 07/13/2018   Procedure: LEFT HEART CATH AND CORS/GRAFTS ANGIOGRAPHY;  Surgeon: Swaziland, Peter M, MD;  Location: [facility] INVASIVE CV LAB;  Service: Cardiovascular;  Laterality: N/A;   LEFT HEART CATH AND CORS/GRAFTS ANGIOGRAPHY N/A 07/22/2021   Procedure: LEFT HEART CATH AND CORS/GRAFTS ANGIOGRAPHY;  Surgeon: Runell Gess, MD;  [facility]: [facility] INVAS[facility] CV LAB;  Service: Cardiovascular;  Laterality: N/A;   LEFT HEART  CATHETERIZATION WITH CORONARY ANGIOGRAM N/A 12/15/2011   Procedure: LEFT HEART CATHETERIZATION WITH CORONARY ANGIOGRAM;  Surgeon: Kathleene Hazel, MD;  Location: [facility] CATH LAB;  Service: Cardiovascular;  Laterality: N/A;   LEFT HEART CATHETERIZATION WITH CORONARY ANGIOGRAM N/A 01/10/2012   Procedure: LEFT HEART CATHETERIZATION WITH CORONARY ANGIOGRAM;  Surgeon: Peter M Swaziland, MD;  Location: [facility] CATH LAB;  Service: Cardiovascular;  Laterality: N/A;   LEFT HEART CATHETERIZATION WITH CORONARY ANGIOGRAM N/A 06/08/2012   Procedure: LEFT HEART CATHETERIZATION WITH CORONARY ANGIOGRAM;  Surgeon: Kathleene Hazel, MD;  Location: [facility] CATH LAB;  Service: Cardiovascular;  Laterality: N/A;   LEFT HEART CATHETERIZATION WITH CORONARY/GRAFT ANGIOGRAM N/A 12/10/2013   Procedure: LEFT HEART CATHETERIZATION WITH Isabel Caprice;  Surgeon: Corky Crafts, MD;  Location: [facility] CATH LAB;  Service: Cardiovascular;  Laterality: N/A;   OVARY SURGERY     ovarian cancer   POLYPECTOMY  03/09/2019   Procedure: POLYPECTOMY;  Surgeon: Malissa Hippo, MD;  [facility];  Service: Endoscopy;;  cecal    POLYPECTOMY N/A 09/26/2019   Procedure: DUODENAL POLYPECTOMY;  Surgeon: Malissa Hippo, MD;  [facility];  Service: Endoscopy;  Laterality: N/A;   POLYPECTOMY  08/11/2022   Procedure: POLYPECTOMY;  Surgeon: Beverley Fiedler, MD;  Location: [facility] ENDOSCOPY;  Service: Gastroenterology;;   REVISION OF ARTERIOVENOUS GORETEX GRAFT N/A 02/24/2017   Procedure: REVISION OF ARTERIOVENOUS GORETEX GRAFT (RESECTION);  Surgeon: Renford Dills, MD;  Location: [facility] ORS;  Service: Vascular;  Laterality: N/A;   REVISON OF ARTERIOVENOUS FISTULA Left 06/19/2020   Procedure: REVISION OF LEFT UPPER ARM AV GRAFT WITH INTERPOSITION JUMP GRAFT USING GORE LIMB;  Surgeon: Cephus Shelling, MD;  Location: [facility] OR;  Service: Vascular;  Laterality: Left;   SHUNTOGRAM N/A 10/15/2013   Procedure: Fistulogram;  Surgeon:  Nada Libman, MD;  Location: [facility] CATH LAB;  Service: Cardiovascular;  Laterality: N/A;   SUBMUCOSAL TATTOO INJECTION  11/01/2022   Procedure: SUBMUCOSAL TATTOO INJECTION;  Surgeon: Dolores Frame, MD;  [facility];  Service: Gastroenterology;;   THROMBECTOMY / ARTERIOVENOUS GRAFT REVISION  2011   left upper arm   TUBAL LIGATION  1980's   UPPER EXTREMITY ANGIOGRAPHY Bilateral 12/06/2016   Procedure: Upper Extremity Angiography;  Surgeon: Renford Dills, MD;  Location: [facility] INVASIVE CV LAB;  Service: Cardiovascular;  Laterality: Bilateral;   UPPER EXTREMITY INTERVENTION Left 06/06/2017   Procedure: UPPER EXTREMITY INTERVENTION;  Surgeon: Renford Dills, MD;  Location: [facility] INVASIVE CV LAB;  Service: Cardiovascular;  Laterality: Left;   UPPER EXTREMITY VENOGRAPHY Left 10/04/2022   Procedure: UPPER EXTREMITY VENOGRAPHY;  Surgeon: Renford Dills, MD;  Location: [facility] INVASIVE CV LAB;  Service: Cardiovascular;  Laterality: Left;   Family History Family History  Problem Relation Age of Onset   Heart disease Mother        Heart Disease before age 46   Hyperlipidemia Mother    Hypertension Mother    Diabetes Mother    Heart attack Mother  Heart disease Father        Heart Disease before age 23   Hyperlipidemia Father    Hypertension Father    Diabetes Father    Diabetes Sister    Hypertension Sister    Diabetes Brother    Hyperlipidemia Brother    Heart attack Brother    Hypertension Sister    Heart attack Brother    Colon cancer Child 67   Other Other        noncontributory for early CAD   Esophageal cancer Neg Hx    Liver disease Neg Hx    Kidney disease Neg Hx    Colon polyps Neg Hx     Social History Social History   Tobacco Use   Smoking status: Never   Smokeless tobacco: Never  Vaping Use   Vaping status: Never Used  Substance Use Topics   Alcohol use: No    Alcohol/week: 0.0 standard drinks of alcohol   Drug use: No    Allergies Amlodipine, Aspirin, Nitrofurantoin, Ranexa [ranolazine], Bactrim [sulfamethoxazole-trimethoprim], Iodinated contrast media, Iron, Tylenol [acetaminophen], Gabapentin, Sucroferric oxyhydroxide, Levaquin [levofloxacin in d5w], Levofloxacin, Plavix [clopidogrel bisulfate], Protonix [pantoprazole sodium], and Venofer [ferric oxide]  Review of Systems Review of Systems  All other systems reviewed and are negative.   Physical Exam Vital Signs  I have reviewed the triage vital signs BP (!) 123/59   Pulse (!) 53   Temp 97.6 F (36.4 C) (Oral)   Resp 20   Ht 5\' 2"  (1.575 m)   Wt 57.3 kg   SpO2 100%   BMI 23.10 kg/m  Physical Exam Vitals and nursing note reviewed.  Constitutional:      General: She is not in acute distress.    Appearance: She is well-developed.  HENT:     Head: Normocephalic and atraumatic.     Mouth/Throat:     Mouth: Mucous membranes are moist.  Eyes:     Pupils: Pupils are equal, round, and reactive to light.  Cardiovascular:     Rate and Rhythm: Normal rate and regular rhythm.     Heart sounds: No murmur heard. Pulmonary:     Effort: Pulmonary effort is normal. No respiratory distress.     Breath sounds: Normal breath sounds.  Abdominal:     General: Abdomen is flat.     Palpations: Abdomen is soft.     Tenderness: There is no abdominal tenderness.  Musculoskeletal:        General: No tenderness.     Right lower leg: Edema present.     Left lower leg: Edema present.  Skin:    General: Skin is warm and dry.  Neurological:     General: No focal deficit present.     Mental Status: She is alert. Mental status is at baseline.     Comments: Cranial nerves II through XII intact, strength 5 out of 5 in the bilateral upper and lower extremities, no sensory deficit to light touch, no dysmetria on finger-nose-finger testing, ambulatory with steady gait.  Psychiatric:        Mood and Affect: Mood normal.        Behavior: Behavior normal.     ED  Results and Treatments Labs (all labs ordered are listed, but only abnormal results are displayed) Labs Reviewed  BASIC METABOLIC PANEL WITH GFR - Abnormal; Notable for the following components:      Result Value   CO2 18 (*)    Glucose, Bld 116 (*)  BUN 36 (*)    Creatinine, Ser 6.08 (*)    Calcium 7.4 (*)    GFR, Estimated 6 (*)    Anion gap 16 (*)    All other components within normal limits                                                                                                                          Radiology MR BRAIN WO CONTRAST Result Date: 10/09/2023 CLINICAL DATA:  Neuro deficit, acute, stroke suspected. EXAM: MRI HEAD WITHOUT CONTRAST TECHNIQUE: Multiplanar, multiecho pulse sequences of the brain and surrounding structures were obtained without intravenous contrast. COMPARISON:  CT head without contrast 09/08/2023. MR head without contrast 10/31/2022. FINDINGS: Brain: Thin bilateral subdural collections are new bilaterally. Moderate generalized atrophy and diffuse white matter disease is otherwise stable. Remote lacunar infarcts are present the basal ganglia bilaterally. The ventricles are proportionate to the degree of atrophy. The brainstem and cerebellum are within normal limits. The internal auditory canals are within normal limits. Vascular: Flow is present in the major intracranial arteries. Skull and upper cervical spine: Degenerative changes are present in the upper cervical spine without definite focal stenosis. The craniocervical junction is normal. T1 marrow signal is mildly depressed throughout. No focal lesions are present. Sinuses/Orbits: The paranasal sinuses and mastoid air cells are clear. Dysconjugate gaze is noted. Globes and orbits are otherwise within normal limits. IMPRESSION: 1. Thin bilateral subdural collections are new bilaterally. 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 No mass effect is associated. 2. Otherwise stable moderate generalized atrophy and  diffuse white matter disease likely reflects the sequela of chronic microvascular ischemia. 3. Stable remote lacunar infarcts of the basal ganglia bilaterally. 4. Degenerative changes in the upper cervical spine without definite focal stenosis. 5. T1 marrow signal is mildly depressed throughout. This is a nonspecific finding but can be seen in the setting of anemia, smoking, obesity, or other hematologic disorders. Electronically Signed   By: Marin Roberts M.D.   On: 10/09/2023 18:29    Pertinent labs & imaging results that were available during my care of the patient were reviewed by me and considered in my medical decision making (see MDM for details).  Medications Ordered in ED Medications - No data to display  Procedures .Critical Care  Performed by: Lonell Grandchild, MD Authorized by: Lonell Grandchild, MD   Critical care provider statement:    Critical care time (minutes):  30   Critical care was necessary to treat or prevent imminent or life-threatening deterioration of the following conditions:  CNS failure or compromise   Critical care was time spent personally by me on the following activities:  Development of treatment plan with patient or surrogate, discussions with consultants, evaluation of patient's response to treatment, examination of patient, ordering and review of laboratory studies, ordering and review of radiographic studies, ordering and performing treatments and interventions, pulse oximetry, re-evaluation of patient's condition and review of old charts   (including critical care time)  Medical Decision Making / ED Course   MDM:  83 year old presenting with episode of dizziness.  Patient well-appearing, symptoms have improved at time of presentation to emergency department.  Denies symptoms currently.  Neurologic exam is  reassuring.  No focal neurologic finding or dysmetria on exam.  Unclear cause of symptoms, could be peripheral vertigo or central cause.  Patient has extensive cardiac risk factors and is on dialysis at high risk for strokes will obtain MRI of the brain.  If this is negative, low concern for dangerous cause.  Patient did miss dialysis today, initial labs reassuring without significant electrolyte derangement other than what would be expected with chronic end-stage renal disease.  Patient not hypoxic, speaking full sentences, mildly edematous but not severely volume overloaded.  Do not think patient needs dialysis today if her MRI is negative.  Advised that she call her dialysis center to try to have dialysis performed tomorrow to make up for missed dialysis session.  Clinical Course as of 10/09/23 2043  Mon Oct 09, 2023  2037 MRI shows thin bilateral hygromas which are new from prior imaging.  Patient denies any falling or headaches.  Discussed with Dr. Danielle Dess with neurosurgery, does not think there is any surgical intervention indicated or need for repeat imaging.  Patient not on any anticoagulation.  Unclear if this is at all related to her dizziness sensation.  She reports she still feels well and is not having ongoing dizziness.  BMP reassuring without findings concerning for need for emergent dialysis in the emergency department.  Patient not hypoxic and request to go home.  Advise she should call her dialysis center tomorrow to help schedule dialysis, see if she can get an earlier dialysis appointment.  CBC was not able to be drawn, apparently hemolyzed and patient has poor veins.  Her CBC was checked yesterday when she was here and nasal congestion.  Do not think this needs to be repeated. Will discharge patient to home. All questions answered. Patient comfortable with plan of discharge. Return precautions discussed with patient and specified on the after visit summary.  [WS]    Clinical Course User  Index [WS] Lonell Grandchild, MD     Additional history obtained: -Additional history obtained from spouse -External records from outside source obtained and reviewed including: Chart review including previous notes, labs, imaging, consultation notes including prior er visits and imaging    Lab Tests: -I ordered, reviewed, and interpreted labs.   The pertinent results include:   Labs Reviewed  BASIC METABOLIC PANEL WITH GFR - Abnormal; Notable for the following components:      Result Value   CO2 18 (*)    Glucose, Bld 116 (*)    BUN 36 (*)    Creatinine, Ser 6.08 (*)  Calcium 7.4 (*)    GFR, Estimated 6 (*)    Anion gap 16 (*)    All other components within normal limits    Notable for signs of ESRD  EKG   EKG Interpretation Date/Time:  Monday October 09 2023 17:04:20 EDT Ventricular Rate:  53 PR Interval:  190 QRS Duration:  129 QT Interval:  538 QTC Calculation: 506 R Axis:   10  Text Interpretation: Sinus rhythm Left bundle branch block Confirmed by Alvino Blood ([id number]) on 10/09/2023 5:49:04 PM         Imaging Studies ordered: I ordered imaging studies including MRI brain On my interpretation imaging demonstrates no stroke, hygroma I independently visualized and interpreted imaging. I agree with the radiologist interpretation   Medicines ordered and prescription drug management: No orders of the defined types were placed in this encounter.   -I have reviewed the patients home medicines and have made adjustments as needed   Consultations Obtained: I requested consultation with the Neurosurgeon,  and discussed lab and imaging findings as well as pertinent plan - they recommend: no intervention needed   Reevaluation: After the interventions noted above, I reevaluated the patient and found that their symptoms have improved  Co morbidities that complicate the patient evaluation  Past Medical History:  Diagnosis Date   Acute on chronic  respiratory failure with hypoxia (HCC) 10/10/2016   Anxiety    Arthritis    AVM (arteriovenous malformation) of colon    CAD (coronary artery disease)    a. s/p CABG in 2013 b. DES to D1 in 10/2016. c. cath in 07/2018 showing patent grafts with occlusion of D1 at prior stent site and progression of PDA disease --> medical management recommended   Carotid artery disease (HCC)    a. 60-79% LICA, 03/2012    Chronic bronchitis (HCC)    Chronic HFrEF (heart failure with reduced ejection fraction) (HCC)    Colon cancer (HCC) 1992   Esophageal stricture    ESRD on hemodialysis (HCC)    ESRD due to HTN, started dialysis 2011 and gets HD at [facility] with Dr Fausto Skillern on MWF schedule.  Access is LUA AVF as of Sept 2014.    GERD (gastroesophageal reflux disease)    High cholesterol 12/2011   History of blood transfusion 07/2011; 12/2011; 01/2012 X 2; 04/2012   History of gout    History of lower GI bleeding    Hypertension    Iron deficiency anemia    Jugular vein occlusion, right (HCC)    Mitral regurgitation    a. Moderate by echo, 02/2012   Mitral valve disease    NSVT (nonsustained ventricular tachycardia) (HCC)    Ovarian cancer (HCC) 1992   PAF (paroxysmal atrial fibrillation) (HCC)    Pneumonia ~ 2009   PUD (peptic ulcer disease)    TIA (transient ischemic attack)    Tricuspid valve disease       Dispostion: Disposition decision including need for hospitalization was considered, and patient discharged from emergency department.    Final Clinical Impression(s) / ED Diagnoses Final diagnoses:  Dizziness  Subdural hygroma     This chart was dictated using voice recognition software.  Despite best efforts to proofread,  errors can occur which can change the documentation meaning.    Lonell Grandchild, MD 10/09/23 2043

## 2023-10-09 NOTE — ED Triage Notes (Signed)
 Pt arrived via POV c/o dizziness and weakness. Pt reports she was unable to receive dialysis today because the dialysis center did not feel comfortable giving her dialysis due to her symptoms.

## 2023-10-14 NOTE — Progress Notes (Signed)
 Necessary for workup

## 2023-10-25 NOTE — Progress Notes (Deleted)
 GI Office Note    Referring Provider: [facility] Primary Care Physician:  [facility] Primary Gastroenterologist: Dolores Frame, MD  Date:  10/26/2023  ID:  Shawna [name], DOB [birth year], MRN [id number]   Chief Complaint   No chief complaint on file.  History of Present Illness  Shawna [name] is a 83 y.o. female with a history of dementia, ESRD on HD, A-fib on ASA, CAD, heart failure, recurrent upper GI bleeding secondary to small bowel AVMs, colon cancer s/p resection 1992, IDA, and duodenal adenoma presenting today for hospital follow-up.  Admission to [facility] in January 2024 for anemia. Underwent EGD and colonoscopy 08/11/2022. 1 small bowel AVM ablated, small duodenal polyp removed (duodenal adenoma), colonoscopy with healthy-appearing anastomosis x 2, 6 polyps removed in the transverse descending sigmoid colons, tubular adenomas on pathology.   Seen by our service during hospitalization 08/27/22 for anemia. Patient with baseline dementia therefore history difficult to obtain. No melena, brbpr, abdominal pain. Hgb 6.6. on admission and received 2u PRBC. Underwent EGD and VCE as noted below. Advised to consider octreotide subcu outpatient.   Last office visit 10/06/2022.  Admitted to feeling a little dizzy, had not had much to drink that day.  Did eat breakfast however.  Having sinus drainage and shortness of breath improved.  Energy levels remain low.  Denied pica but reported good appetite.  No melena or BRBPR.  Also denied chest pain.  Husband reported blood pressures have been up-and-down.  Advised to consider octreotide for recurrent anemia.  May need to repeat push enteroscopy and colonoscopy with neo-TI intubation if anemia were to worsen.  Continue daily iron supplementation and B12 injections.  Continue PPI daily.  After hospitalization in May, it was recommended for patient to start octreotide.  We worked with [employer] to get this done.  Ultimately  medication was to be shipped to patient's home and be given at Dr. Leretha Pol office.  She has had multiple ED visits and hospitalizations since her last visit.  She has had 12-13 ED visits for fatigue, edema, dizziness, nasal congestion, and rectal bleeding.  Has also had multiple hospitalizations for bradycardia, pneumonia, weakness, and anemia.  Seen inpatient in July 2024 as well as most recently in late March for anemia.  In July 2024 she had hemoglobin of 6.4 and was given blood products.  EGD/enteroscopy initially considered.  She was given octreotide subcutaneously while inpatient.  In July given her iron stores were normal and her hemoglobin improved to the 12 range after 2 units PRBC she did not undergo procedures and was recommended to continue octreotide outpatient and monitor for large drops in hemoglobin.  Most recently during hospitalization in March she presented with complaints of rectal bleeding.  Hemoglobin was 8.3 which was up from 7.12 weeks prior.  She had further decline to hemoglobin of 7.4 with iron 21, saturation 92%.  Given during her time in the hospital she did not have any further rectal bleeding, suspected recurrent AVM bleeding and was advised to follow her hemoglobin closely and consider endoscopic evaluation if she continues to have downtrending hemoglobin or recurrent rectal bleeding.  Given hemoglobin improved and there were no further signs of rectal bleeding, she was advised to continue monthly octreotide injections, consider outpatient evaluation for hematology and continue PPI twice daily.     Latest Ref Rng & Units 10/08/2023    4:39 AM 10/07/2023    7:27 AM 10/06/2023   11:15 AM  CBC  WBC 4.0 -  10.5 K/uL 4.3  5.2  4.3   Hemoglobin 12.0 - 15.0 g/dL 9.4  52.8  9.7   Hematocrit 36.0 - 46.0 % 30.8  33.4  30.2   Platelets 150 - 400 K/uL 138  164  173    Today:  ***    Procedure and imaging history: 07/2016 EGD for anemia: -prior duodenal AVMs, recent NSTEMI  and pending cardiac cath.  2 cm hiatal hernia.  Normal stomach and esophagus.  Solitary duodenal versus jejunal polyp was not removed.  Nonbleeding jejunal AVM treated with APC and may have been source for anemia.   02/2019 colonoscopy.  For evaluation gastrointestinal bleeding: -Pandiverticulosis.  Patent ileocolonic anastomosis.  Removed 2 small polyps (TAs) from ascending colon.  Piecemeal resection using a lift technique of large polyp (TA) in transverse colon.   09/2019 EGD for evaluation duodenal polyp: Esophagus, Z-line normal.  3 cm hiatal hernia.  Normal stomach and duodenal bulb.  Solitary, diminutive, sessile polyp at D2 was biopsied (adenoma wo HGD).  Exam completed to third duodenum.   02/05/2021 EGD for melena, blood loss anemia: -Showed mild severity esophageal candidiasis without bleeding.  Gastritis, nonbleeding.  Normal examined duodenum to D2.  Treated with Diflucan for 14 days and continue daily oral PPI.   04/22/2021 VCE: -Successful, complete study.  Small, flat polyp in either second or third portion duodenum with no stigmata of bleeding.  2 polyps at distal small bowel, 1 small size the other polyp medium.  No bleeding stigmata in either of these polyps.  Dr Homero Luster rec push enteroscopy.     06/08/2021 colonoscopy, enteroscopy were cxld due to IV infiltration RUE.  LUE not amenable to IV as her fistula is in that arm..  Plans for outpt PICC line and proceed with GI procedures in January 2022, but procedures never happened. Meds include low-dose aspirin.  Prilosec 20 mg/day, Zofran prn.  Folic acid. Mircera, last dose 1/22.    08/11/2022 EGD: -2 cm hiatal hernia, normal esophagus otherwise, duodenal AVM status post APC, 5 mm duodenal polyp removed (duodenal adenoma)   08/11/2022 colonoscopy: -end-to-end ileocolonic anastomosis ascending colon, end-to-side colocolonic anastomosis rectosigmoid colon, normal neoterminal ileum, 6 polyps removed in the transverse, descending, sigmoid  colons.  Tubular adenomas on pathology   EGD 08/27/2022: -multiple gastric AVMs treated with APC, small hiatal hernia, no active or stigmata of bleeding. VCE placed   VCE 08/27/22 read 08/28/22 -A few small AVMs in the small bowel, likely jejunum.  2 small polyps in the distal small bowel previously noted.  No active or stigmata of bleeding.  Capsule appears to sit at the ileocolonic anastomosis for the last 2 to 3 hours of study.   KUB 08/28/22 showed capsule in RLQ   KUB 08/29/22 with capsule likely within the rectum  (lower midline pelvis)  Enteroscopy 12/01/2022: - 2 cm hiatal hernia - Few nonbleeding angiodysplastic lesions in the stomach treated with APC, otherwise normal - Normal examined duodenum - Tattoo seen in the jejunum with small polyp - Jejunal diverticulum several, single large diverticulum noted - 6 nonbleeding angiodysplastic lesions in the proximal jejunum beyond the tattoo treated with APC therapy - Advised to follow-up with primary GI MD for removal of jejunal polyp at another time and continue octreotide injections  Wt Readings from Last 3 Encounters:  10/09/23 126 lb 5.2 oz (57.3 kg)  10/08/23 126 lb 5.2 oz (57.3 kg)  09/20/23 120 lb (54.4 kg)    Current Outpatient Medications  Medication Sig Dispense Refill  amiodarone (PACERONE) 200 MG tablet Take 2 tablets (400 mg total) by mouth 2 (two) times daily for 7 days, THEN 1 tablet (200 mg total) 2 (two) times daily for 23 days. 74 tablet 0   calcitRIOL (ROCALTROL) 0.25 MCG capsule Take 7 capsules (1.75 mcg total) by mouth every Monday, Wednesday, and Friday with hemodialysis.     cinacalcet (SENSIPAR) 30 MG tablet Take 1 tablet (30 mg total) by mouth every Monday, Wednesday, and Friday with hemodialysis. 60 tablet    cyanocobalamin (VITAMIN B12) 1000 MCG/ML injection Inject 1,000 mcg into the muscle every 30 (thirty) days.     doxycycline (VIBRAMYCIN) 100 MG capsule Take 1 capsule (100 mg total) by mouth 2 (two) times  daily. One po bid x 7 days 14 capsule 0   famotidine (PEPCID) 20 MG tablet Take 1 tablet (20 mg total) by mouth daily as needed for heartburn or indigestion.     fluticasone (FLONASE) 50 MCG/ACT nasal spray Place 2 sprays into both nostrils daily.     folic acid (FOLVITE) 1 MG tablet Take 1 mg by mouth daily.     hydrOXYzine (ATARAX) 25 MG tablet Take 25 mg by mouth every 6 (six) hours as needed for itching.     levETIRAcetam (KEPPRA) 500 MG tablet Take 1 tablet (500 mg total) by mouth daily. 30 tablet 0   levothyroxine (SYNTHROID) 50 MCG tablet Take 50 mcg by mouth daily.     metoprolol tartrate (LOPRESSOR) 25 MG tablet Take 0.5 tablets (12.5 mg total) by mouth 2 (two) times daily. 30 tablet 0   multivitamin (RENA-VIT) TABS tablet Take 1 tablet by mouth daily.     nitroGLYCERIN (NITROSTAT) 0.4 MG SL tablet Place 0.4 mg under the tongue every 5 (five) minutes as needed for chest pain.     octreotide (SANDOSTATIN LAR) 20 MG injection Inject 20 mg into the muscle every 28 (twenty-eight) days. 1 each 0   ondansetron (ZOFRAN-ODT) 4 MG disintegrating tablet Take 4 mg by mouth every 8 (eight) hours as needed for nausea or vomiting.     rosuvastatin (CRESTOR) 10 MG tablet Take 1 tablet by mouth once daily 90 tablet 0   No current facility-administered medications for this visit.    Past Medical History:  Diagnosis Date   Acute on chronic respiratory failure with hypoxia (HCC) 10/10/2016   Anxiety    Arthritis    AVM (arteriovenous malformation) of colon    CAD (coronary artery disease)    a. s/p CABG in 2013 b. DES to D1 in 10/2016. c. cath in 07/2018 showing patent grafts with occlusion of D1 at prior stent site and progression of PDA disease --> medical management recommended   Carotid artery disease (HCC)    a. 60-79% LICA, 03/2012    Chronic bronchitis (HCC)    Chronic HFrEF (heart failure with reduced ejection fraction) (HCC)    Colon cancer (HCC) 1992   Esophageal stricture    ESRD on  hemodialysis (HCC)    ESRD due to HTN, started dialysis 2011 and gets HD at [facility] with Dr Fausto Skillern on MWF schedule.  Access is LUA AVF as of Sept 2014.    GERD (gastroesophageal reflux disease)    High cholesterol 12/2011   History of blood transfusion 07/2011; 12/2011; 01/2012 X 2; 04/2012   History of gout    History of lower GI bleeding    Hypertension    Iron deficiency anemia    Jugular vein occlusion, right (HCC)  Mitral regurgitation    a. Moderate by echo, 02/2012   Mitral valve disease    NSVT (nonsustained ventricular tachycardia) (HCC)    Ovarian cancer (HCC) 1992   PAF (paroxysmal atrial fibrillation) (HCC)    Pneumonia ~ 2009   PUD (peptic ulcer disease)    TIA (transient ischemic attack)    Tricuspid valve disease     Past Surgical History:  Procedure Laterality Date   A/V FISTULAGRAM Left 10/04/2022   Procedure: A/V Fistulagram;  Surgeon: Jackquelyn Mass, MD;  Location: [facility] INVASIVE CV LAB;  Service: Cardiovascular;  Laterality: Left;   A/V SHUNTOGRAM Left 03/19/2019   Procedure: A/V SHUNTOGRAM;  Surgeon: Jackquelyn Mass, MD;  Location: [facility] INVASIVE CV LAB;  Service: Cardiovascular;  Laterality: Left;   ABDOMINAL HYSTERECTOMY  1992   APPENDECTOMY  06/1990   AV FISTULA PLACEMENT  07/2009   left upper arm   AV FISTULA PLACEMENT Right 09/06/2016   Procedure: RIGHT FOREARM ARTERIOVENOUS (AV) GRAFT;  Surgeon: Richrd Char, MD;  Location: [facility] OR;  Service: Vascular;  Laterality: Right;   AV FISTULA PLACEMENT N/A 02/24/2017   Procedure: INSERTION OF ARTERIOVENOUS (AV) GORE-TEX GRAFT ARM (BRACHIAL AXILLARY);  Surgeon: Jackquelyn Mass, MD;  Location: [facility] ORS;  Service: Vascular;  Laterality: N/A;   AVGG REMOVAL Right 09/06/2016   Procedure: REMOVAL OF Right Arm ARTERIOVENOUS GORETEX GRAFT and Vein Patch angioplasty of brachial artery;  Surgeon: Dannis Dy, MD;  Location: [facility] OR;  Service: Vascular;  Laterality: Right;   BIOPSY  09/26/2019   Procedure:  BIOPSY;  Surgeon: Rickesha Corporal, MD;  [facility];  Service: Endoscopy;;   BIOPSY  11/01/2022   Procedure: BIOPSY;  Surgeon: Urban Garden, MD;  [facility];  Service: Gastroenterology;;   COLON RESECTION  1992   COLON SURGERY     COLONOSCOPY N/A 03/09/2019   Procedure: COLONOSCOPY;  Surgeon: Cherrish Corporal, MD;  [facility];  Service: Endoscopy;  Laterality: N/A;   COLONOSCOPY N/A 08/11/2022   Procedure: COLONOSCOPY;  Surgeon: Nannette Babe, MD;  Location: [facility] ENDOSCOPY;  Service: Gastroenterology;  Laterality: N/A;   COLONOSCOPY WITH PROPOFOL N/A 06/08/2021   Procedure: COLONOSCOPY WITH PROPOFOL;  Surgeon: Urban Garden, MD;  [facility];  Service: Gastroenterology;  Laterality: N/A;  9:05 /Patient is on dialysis Mon Wed Fri   CORONARY ANGIOPLASTY WITH STENT PLACEMENT  12/15/11   "2"   CORONARY ANGIOPLASTY WITH STENT PLACEMENT  y/2013   "1; makes total of 3" (05/02/2012)   CORONARY ARTERY BYPASS GRAFT  06/13/2012   Procedure: CORONARY ARTERY BYPASS GRAFTING (CABG);  Surgeon: Norita Beauvais, MD;  Location: [facility] OR;  Service: Open Heart Surgery;  Laterality: N/A;  cabg x four;  using left internal mammary artery, and left leg greater saphenous vein harvested endoscopically   CORONARY STENT INTERVENTION N/A 10/13/2016   Procedure: Coronary Stent Intervention;  Surgeon: Millicent Ally, MD;  [facility];  Service: Cardiovascular;  Laterality: N/A;   DIALYSIS/PERMA CATHETER REMOVAL N/A 04/18/2017   Procedure: DIALYSIS/PERMA CATHETER REMOVAL;  Surgeon: Jackquelyn Mass, MD;  Location: [facility] INVASIVE CV LAB;  Service: Cardiovascular;  Laterality: N/A;   DILATION AND CURETTAGE OF UTERUS     ENTEROSCOPY N/A 06/08/2021   Procedure: PUSH ENTEROSCOPY;  Surgeon: Urban Garden, MD;  [facility];  Service: Gastroenterology;  Laterality: N/A;   ENTEROSCOPY N/A 11/01/2022   Procedure: ENTEROSCOPY;   Surgeon: Sammi Crick  Alisia Ferrari, MD;  [facility];  Service: Gastroenterology;  Laterality: N/A;   ENTEROSCOPY N/A 12/01/2022   Procedure: ENTEROSCOPY;  Surgeon: Benancio Deeds, MD;  Location: [facility] ENDOSCOPY;  Service: Gastroenterology;  Laterality: N/A;   ESOPHAGOGASTRODUODENOSCOPY  01/20/2012   Procedure: ESOPHAGOGASTRODUODENOSCOPY (EGD);  Surgeon: Meryl Dare, MD,FACG;  Location: [facility] ENDOSCOPY;  Service: Endoscopy;  Laterality: N/A;   ESOPHAGOGASTRODUODENOSCOPY N/A 03/26/2013   Procedure: ESOPHAGOGASTRODUODENOSCOPY (EGD);  Surgeon: Hilarie Fredrickson, MD;  Location: [facility] ENDOSCOPY;  Service: Endoscopy;  Laterality: N/A;   ESOPHAGOGASTRODUODENOSCOPY N/A 04/30/2015   Procedure: ESOPHAGOGASTRODUODENOSCOPY (EGD);  Surgeon: Malissa Hippo, MD;  [facility];  Service: Endoscopy;  Laterality: N/A;  1pm - moved to 10/20 @ 1:10   ESOPHAGOGASTRODUODENOSCOPY N/A 07/29/2016   Procedure: ESOPHAGOGASTRODUODENOSCOPY (EGD);  Surgeon: Ruffin Frederick, MD;  Location[facility] ENDOSCOPY;  Service: Gastroenterology;  Laterality: N/A;  enteroscopy   ESOPHAGOGASTRODUODENOSCOPY N/A 09/26/2019   Procedure: ESOPHAGOGASTRODUODENOSCOPY (EGD);  Surgeon: Malissa Hippo, MD;  [facility];  Service: Endoscopy;  Laterality: N/A;  1250   ESOPHAGOGASTRODUODENOSCOPY N/A 08/11/2022   Procedure: ESOPHAGOGASTRODUODENOSCOPY (EGD);  Surgeon: Beverley Fiedler, MD;  Location: [facility] ENDOSCOPY;  Service: Gastroenterology;  Laterality: N/A;   ESOPHAGOGASTRODUODENOSCOPY (EGD) WITH PROPOFOL N/A 02/05/2021   Procedure: ESOPHAGOGASTRODUODENOSCOPY (EGD) WITH PROPOFOL;  Surgeon: Lanelle Bal, DO;  [facility];  Service: Endoscopy;  Laterality: N/A;   ESOPHAGOGASTRODUODENOSCOPY (EGD) WITH PROPOFOL N/A 08/27/2022   Procedure: ESOPHAGOGASTRODUODENOSCOPY (EGD) WITH PROPOFOL;  Surgeon: Lanelle Bal, DO;  [facility];  Service: Endoscopy;  Laterality: N/A;   GIVENS CAPSULE STUDY N/A  03/07/2019   Procedure: GIVENS CAPSULE STUDY;  Surgeon: Malissa Hippo, MD;  [facility];  Service: Endoscopy;  Laterality: N/A;  7:30   GIVENS CAPSULE STUDY N/A 04/22/2021   Procedure: GIVENS CAPSULE STUDY;  Surgeon: Malissa Hippo, MD;  [facility];  Service: Endoscopy;  Laterality: N/A;  7:30   GIVENS CAPSULE STUDY N/A 08/27/2022   Procedure: GIVENS CAPSULE STUDY;  Surgeon: Lanelle Bal, DO;  [facility];  Service: Endoscopy;  Laterality: N/A;   HOT HEMOSTASIS N/A 08/11/2022   Procedure: HOT HEMOSTASIS (ARGON PLASMA COAGULATION/BICAP);  Surgeon: Beverley Fiedler, MD;  Location: [facility] ENDOSCOPY;  Service: Gastroenterology;  Laterality: N/A;   HOT HEMOSTASIS N/A 12/01/2022   Procedure: HOT HEMOSTASIS (ARGON PLASMA COAGULATION/BICAP);  Surgeon: Benancio Deeds, MD;  Location: [facility] ENDOSCOPY;  Service: Gastroenterology;  Laterality: N/A;   INSERTION OF DIALYSIS CATHETER N/A 10/05/2020   Procedure: ABORTED TUNNELED DIALYSIS CATHETER PLACEMENT RIGHT INTERNAL JUGULAR VEIN ;  Surgeon: Lucretia Roers, MD;  Location: [facility];  Service: General;  Laterality: N/A;   INTRAOPERATIVE TRANSESOPHAGEAL ECHOCARDIOGRAM  06/13/2012   Procedure: INTRAOPERATIVE TRANSESOPHAGEAL ECHOCARDIOGRAM;  Surgeon: Delight Ovens, MD;  Location: [facility] OR;  Service: Open Heart Surgery;  Laterality: N/A;   IR DIALY SHUNT INTRO NEEDLE/INTRACATH INITIAL W/IMG LEFT Left 10/06/2020   IR FLUORO GUIDE CV LINE RIGHT  06/17/2020   IR FLUORO GUIDE CV LINE RIGHT  11/12/2022   IR GENERIC HISTORICAL  07/26/2016   IR FLUORO GUIDE CV LINE RIGHT 07/26/2016 Berdine Dance, MD [facility]   IR GENERIC HISTORICAL  07/26/2016   IR US GUIDE VASC ACCESS RIGHT 07/26/2016 Berdine Dance, MD [facility]   IR GENERIC HISTORICAL  08/02/2016   IR US GUIDE VASC ACCESS RIGHT 08/02/2016 Berdine Dance, MD [facility]   IR GENERIC HISTORICAL  08/02/2016  IR FLUORO GUIDE CV LINE RIGHT 08/02/2016 Berdine Dance, MD [facility]    IR RADIOLOGY PERIPHERAL GUIDED IV START  03/28/2017   IR REMOVAL TUN CV CATH W/O FL  08/11/2020   IR REMOVAL TUN CV CATH W/O FL  11/15/2022   IR THROMBECTOMY AV FISTULA W/THROMBOLYSIS INC/SHUNT/IMG LEFT Left 06/17/2020   IR US GUIDE VASC ACCESS LEFT  06/17/2020   IR US GUIDE VASC ACCESS RIGHT  03/28/2017   IR US GUIDE VASC ACCESS RIGHT  06/17/2020   IR US GUIDE VASC ACCESS RIGHT  11/12/2022   LEFT HEART CATH AND CORONARY ANGIOGRAPHY N/A 09/20/2016   Procedure: Left Heart Cath and Coronary Angiography;  Surgeon: Lyn Records, MD;  Location: [facility] INVASIVE CV LAB;  Service: Cardiovascular;  Laterality: N/A;   LEFT HEART CATH AND CORS/GRAFTS ANGIOGRAPHY N/A 10/13/2016   Procedure: Left Heart Cath and Cors/Grafts Angiography;  Surgeon: Lennette Bihari, MD;  Location: [facility] LAB;  Service: Cardiovascular;  Laterality: N/A;   LEFT HEART CATH AND CORS/GRAFTS ANGIOGRAPHY N/A 07/13/2018   Procedure: LEFT HEART CATH AND CORS/GRAFTS ANGIOGRAPHY;  Surgeon: Swaziland, Peter M, MD;  Location: [facility] INVASIVE CV LAB;  Service: Cardiovascular;  Laterality: N/A;   LEFT HEART CATH AND CORS/GRAFTS ANGIOGRAPHY N/A 07/22/2021   Procedure: LEFT HEART CATH AND CORS/GRAFTS ANGIOGRAPHY;  Surgeon: Runell Gess, MD;  Location: [facility] INVASIVE CV LAB;  Service: Cardiovascular;  Laterality: N/A;   LEFT HEART CATHETERIZATION WITH CORONARY ANGIOGRAM N/A 12/15/2011   Procedure: LEFT HEART CATHETERIZATION WITH CORONARY ANGIOGRAM;  Surgeon: Kathleene Hazel, MD;  Location: [facility] CATH LAB;  Service: Cardiovascular;  Laterality: N/A;   LEFT HEART CATHETERIZATION WITH CORONARY ANGIOGRAM N/A 01/10/2012   Procedure: LEFT HEART CATHETERIZATION WITH CORONARY ANGIOGRAM;  Surgeon: Peter M Swaziland, MD;  Location: [facility] CATH LAB;  Service: Cardiovascular;  Laterality: N/A;   LEFT HEART CATHETERIZATION WITH CORONARY ANGIOGRAM N/A 06/08/2012   Procedure: LEFT HEART CATHETERIZATION WITH CORONARY ANGIOGRAM;  Surgeon: Kathleene Hazel, MD;  Location: [facility] CATH  LAB;  Service: Cardiovascular;  Laterality: N/A;   LEFT HEART CATHETERIZATION WITH CORONARY/GRAFT ANGIOGRAM N/A 12/10/2013   Procedure: LEFT HEART CATHETERIZATION WITH Isabel Caprice;  Surgeon: Corky Crafts, MD;  Location: [facility] CATH LAB;  Service: Cardiovascular;  Laterality: N/A;   OVARY SURGERY     ovarian cancer   POLYPECTOMY  03/09/2019   Procedure: POLYPECTOMY;  Surgeon: Malissa Hippo, MD;  [facility];  Service: Endoscopy;;  cecal    POLYPECTOMY N/A 09/26/2019   Procedure: DUODENAL POLYPECTOMY;  Surgeon: Malissa Hippo, MD;  [facility];  Service: Endoscopy;  Laterality: N/A;   POLYPECTOMY  08/11/2022   Procedure: POLYPECTOMY;  Surgeon: Beverley Fiedler, MD;  Location: [facility] ENDOSCOPY;  Service: Gastroenterology;;   REVISION OF ARTERIOVENOUS GORETEX GRAFT N/A 02/24/2017   Procedure: REVISION OF ARTERIOVENOUS GORETEX GRAFT (RESECTION);  Surgeon: Renford Dills, MD;  Location: [facility] ORS;  Service: Vascular;  Laterality: N/A;   REVISON OF ARTERIOVENOUS FISTULA Left 06/19/2020   Procedure: REVISION OF LEFT UPPER ARM AV GRAFT WITH INTERPOSITION JUMP GRAFT USING GORE LIMB;  Surgeon: Cephus Shelling, MD;  Location: [facility] OR;  Service: Vascular;  Laterality: Left;   SHUNTOGRAM N/A 10/15/2013   Procedure: Fistulogram;  Surgeon: Nada Libman, MD;  Location: [facility] CATH LAB;  Service: Cardiovascular;  Laterality: N/A;   SUBMUCOSAL TATTOO INJECTION  11/01/2022   Procedure: SUBMUCOSAL TATTOO INJECTION;  Surgeon: Dolores Frame, MD;  [facility];  Service: Gastroenterology;;   Verlon Go / ARTERIOVENOUS GRAFT REVISION  2011   left upper arm   TUBAL LIGATION  1980's   UPPER EXTREMITY ANGIOGRAPHY Bilateral 12/06/2016   Procedure: Upper Extremity Angiography;  Surgeon: Jackquelyn Mass, MD;  Location: [facility] INVASIVE CV LAB;  Service: Cardiovascular;  Laterality: Bilateral;   UPPER EXTREMITY INTERVENTION Left 06/06/2017   Procedure: UPPER  EXTREMITY INTERVENTION;  Surgeon: Jackquelyn Mass, MD;  Location: [facility] INVASIVE CV LAB;  Service: Cardiovascular;  Laterality: Left;   UPPER EXTREMITY VENOGRAPHY Left 10/04/2022   Procedure: UPPER EXTREMITY VENOGRAPHY;  Surgeon: Jackquelyn Mass, MD;  Location: [facility] INVASIVE CV LAB;  Service: Cardiovascular;  Laterality: Left;    Family History  Problem Relation Age of Onset   Heart disease Mother        Heart Disease before age 3   Hyperlipidemia Mother    Hypertension Mother    Diabetes Mother    Heart attack Mother    Heart disease Father        Heart Disease before age 64   Hyperlipidemia Father    Hypertension Father    Diabetes Father    Diabetes Sister    Hypertension Sister    Diabetes Brother    Hyperlipidemia Brother    Heart attack Brother    Hypertension Sister    Heart attack Brother    Colon cancer Child 60   Other Other        noncontributory for early CAD   Esophageal cancer Neg Hx    Liver disease Neg Hx    Kidney disease Neg Hx    Colon polyps Neg Hx     Allergies as of 10/26/2023 - Review Complete 10/09/2023  Allergen Reaction Noted   Amlodipine Swelling 01/19/2015   Aspirin Other (See Comments) 12/15/2011   Nitrofurantoin Hives 07/08/2009   Ranexa [ranolazine] Other (See Comments) 08/30/2020   Bactrim [sulfamethoxazole-trimethoprim] Rash 12/15/2011   Iodinated contrast media Itching and Other (See Comments) 12/15/2011   Iron Itching and Other (See Comments) 05/02/2012   Tylenol [acetaminophen] Itching and Other (See Comments) 07/08/2016   Gabapentin Other (See Comments) 02/08/2017   Sucroferric oxyhydroxide Other (See Comments) 04/15/2021   Levaquin [levofloxacin in d5w] Rash 10/04/2013   Levofloxacin Rash and Other (See Comments) 04/15/2017   Plavix [clopidogrel bisulfate] Rash 01/05/2012   Protonix [pantoprazole sodium] Rash 03/21/2013   Venofer [ferric oxide] Itching and Other (See Comments) 05/02/2012    Social History    Socioeconomic History   Marital status: Married    Spouse name: Sergio Hobart   Number of children: 1   Years of education: Not on file   Highest education level: GED or equivalent  Occupational History   Occupation: retired- Architectural technologist- se[employer]  Tobacco Use   Smoking status: Never   Smokeless tobacco: Never  Vaping Use   Vaping status: Never Used  Substance and Sexual Activity   Alcohol use: No    Alcohol/week: 0.0 standard drinks of alcohol   Drug use: No   Sexual activity: Not Currently    Birth control/protection: Surgical  Other Topics Concern   Not on file  Social History Narrative   Lives in [place], [place] with husband.  Dialysis pt - [facility].   Social Drivers of Health   Financial Resource Strain: Low Risk  (12/14/2018)   Overall Financial Resource Strain (CARDIA)    Difficulty of Paying Living Expenses: Not very hard  Food Insecurity: No Food Insecurity (10/04/2023)   Hunger Vital Sign  Worried About Programme researcher, broadcasting/film/video in the Last Year: Never true    Ran Out of Food in the Last Year: Never true  Transportation Needs: No Transportation Needs (10/04/2023)   PRAPARE - Administrator, Civil Service (Medical): No    Lack of Transportation (Non-Medical): No  Physical Activity: Unknown (12/14/2018)   Exercise Vital Sign    Days of Exercise per Week: Patient declined    Minutes of Exercise per Session: Patient declined  Stress: No Stress Concern Present (12/14/2018)   [employer] of Occupational Health - Occupational Stress Questionnaire    Feeling of Stress : Only a little  Social Connections: Unknown (10/05/2023)   Social Connection and Isolation Panel [NHANES]    Frequency of Communication with Friends and Family: Patient unable to answer    Frequency of Social Gatherings with Friends and Family: Patient unable to answer    Attends Religious Services: Patient unable to answer    Active Member of Clubs or Organizations: Patient declined     Attends Banker Meetings: Patient unable to answer    Marital Status: Patient unable to answer     Review of Systems   Gen: Denies fever, chills, anorexia. Denies fatigue, weakness, weight loss.  CV: Denies chest pain, palpitations, syncope, peripheral edema, and claudication. Resp: Denies dyspnea at rest, cough, wheezing, coughing up blood, and pleurisy. GI: See HPI Derm: Denies rash, itching, dry skin Psych: Denies depression, anxiety, memory loss, confusion. No homicidal or suicidal ideation.  Heme: Denies bruising, bleeding, and enlarged lymph nodes.  Physical Exam   There were no vitals taken for this visit.  General:   Alert and oriented. No distress noted. Pleasant and cooperative.  Head:  Normocephalic and atraumatic. Eyes:  Conjuctiva clear without scleral icterus. Mouth:  Oral mucosa pink and moist. Good dentition. No lesions. Lungs:  Clear to auscultation bilaterally. No wheezes, rales, or rhonchi. No distress.  Heart:  S1, S2 present without murmurs appreciated.  Abdomen:  +BS, soft, non-tender and non-distended. No rebound or guarding. No HSM or masses noted. Rectal: *** Msk:  Symmetrical without gross deformities. Normal posture. Extremities:  Without edema. Neurologic:  Alert and  oriented x4 Psych:  Alert and cooperative. Normal mood and affect.  Assessment  Shawna [name] is a 83 y.o. female with a history of dementia, ESRD on HD, A-fib on ASA, CAD, heart failure, recurrent upper GI bleeding secondary to small bowel AVMs, colon cancer s/p resection 1992, IDA, and duodenal adenoma presenting today for hospital follow-up of anemia.  Recurrent upper GI bleeding, anemia: - Last enteroscopy in May 2024 with treatment of multiple small bowel lesions with APC therapy - Patient had fluctuating hemoglobin levels.  Most recent hemoglobin stable during hospitalization in the 9 range with normal iron panel - Anemia likely multifactorial in the setting of ESRD and  known small bowel angiodysplastic lesions - Not currently on PPI, notes an allergy/rash to Protonix  PLAN   Recheck H/H Continue monthly octreotide injections Continue famotidine 20 mg twice daily Monitor for rectal bleeding/melena Continue renal diet     Julian Obey, MSN, FNP-BC, AGACNP-BC [facility]

## 2023-10-26 ENCOUNTER — Encounter: Payer: Self-pay | Admitting: Gastroenterology

## 2023-10-26 ENCOUNTER — Inpatient Hospital Stay: Admitting: Gastroenterology

## 2023-11-03 ENCOUNTER — Other Ambulatory Visit: Payer: Self-pay

## 2023-11-03 ENCOUNTER — Inpatient Hospital Stay (HOSPITAL_COMMUNITY)
Admission: EM | Admit: 2023-11-03 | Discharge: 2023-11-14 | DRG: 377 | Disposition: A | Discharge status: Home Health | Attending: Internal Medicine | Admitting: Internal Medicine

## 2023-11-03 DIAGNOSIS — R001 Bradycardia, unspecified: Secondary | ICD-10-CM | POA: Diagnosis not present

## 2023-11-03 DIAGNOSIS — Z888 Allergy status to other drugs, medicaments and biological substances status: Secondary | ICD-10-CM

## 2023-11-03 DIAGNOSIS — I953 Hypotension of hemodialysis: Secondary | ICD-10-CM | POA: Diagnosis present

## 2023-11-03 DIAGNOSIS — G9341 Metabolic encephalopathy: Secondary | ICD-10-CM | POA: Diagnosis not present

## 2023-11-03 DIAGNOSIS — I480 Paroxysmal atrial fibrillation: Secondary | ICD-10-CM | POA: Diagnosis present

## 2023-11-03 DIAGNOSIS — Z955 Presence of coronary angioplasty implant and graft: Secondary | ICD-10-CM

## 2023-11-03 DIAGNOSIS — I083 Combined rheumatic disorders of mitral, aortic and tricuspid valves: Secondary | ICD-10-CM | POA: Diagnosis present

## 2023-11-03 DIAGNOSIS — Z8543 Personal history of malignant neoplasm of ovary: Secondary | ICD-10-CM

## 2023-11-03 DIAGNOSIS — K922 Gastrointestinal hemorrhage, unspecified: Secondary | ICD-10-CM | POA: Diagnosis not present

## 2023-11-03 DIAGNOSIS — N186 End stage renal disease: Secondary | ICD-10-CM | POA: Diagnosis present

## 2023-11-03 DIAGNOSIS — I4892 Unspecified atrial flutter: Secondary | ICD-10-CM | POA: Diagnosis present

## 2023-11-03 DIAGNOSIS — Z79890 Hormone replacement therapy (postmenopausal): Secondary | ICD-10-CM

## 2023-11-03 DIAGNOSIS — K31811 Angiodysplasia of stomach and duodenum with bleeding: Secondary | ICD-10-CM | POA: Diagnosis not present

## 2023-11-03 DIAGNOSIS — E1122 Type 2 diabetes mellitus with diabetic chronic kidney disease: Secondary | ICD-10-CM | POA: Diagnosis present

## 2023-11-03 DIAGNOSIS — Z91041 Radiographic dye allergy status: Secondary | ICD-10-CM

## 2023-11-03 DIAGNOSIS — D649 Anemia, unspecified: Secondary | ICD-10-CM | POA: Diagnosis present

## 2023-11-03 DIAGNOSIS — Z992 Dependence on renal dialysis: Secondary | ICD-10-CM

## 2023-11-03 DIAGNOSIS — I252 Old myocardial infarction: Secondary | ICD-10-CM

## 2023-11-03 DIAGNOSIS — Z8673 Personal history of transient ischemic attack (TIA), and cerebral infarction without residual deficits: Secondary | ICD-10-CM

## 2023-11-03 DIAGNOSIS — M1990 Unspecified osteoarthritis, unspecified site: Secondary | ICD-10-CM | POA: Diagnosis present

## 2023-11-03 DIAGNOSIS — R531 Weakness: Secondary | ICD-10-CM

## 2023-11-03 DIAGNOSIS — I132 Hypertensive heart and chronic kidney disease with heart failure and with stage 5 chronic kidney disease, or end stage renal disease: Secondary | ICD-10-CM | POA: Diagnosis present

## 2023-11-03 DIAGNOSIS — Z8349 Family history of other endocrine, nutritional and metabolic diseases: Secondary | ICD-10-CM

## 2023-11-03 DIAGNOSIS — R627 Adult failure to thrive: Secondary | ICD-10-CM | POA: Diagnosis present

## 2023-11-03 DIAGNOSIS — Z882 Allergy status to sulfonamides status: Secondary | ICD-10-CM

## 2023-11-03 DIAGNOSIS — I2510 Atherosclerotic heart disease of native coronary artery without angina pectoris: Secondary | ICD-10-CM | POA: Diagnosis present

## 2023-11-03 DIAGNOSIS — Z881 Allergy status to other antibiotic agents status: Secondary | ICD-10-CM

## 2023-11-03 DIAGNOSIS — I495 Sick sinus syndrome: Secondary | ICD-10-CM | POA: Diagnosis present

## 2023-11-03 DIAGNOSIS — Z8711 Personal history of peptic ulcer disease: Secondary | ICD-10-CM

## 2023-11-03 DIAGNOSIS — Z66 Do not resuscitate: Secondary | ICD-10-CM | POA: Diagnosis present

## 2023-11-03 DIAGNOSIS — Z886 Allergy status to analgesic agent status: Secondary | ICD-10-CM

## 2023-11-03 DIAGNOSIS — Z85038 Personal history of other malignant neoplasm of large intestine: Secondary | ICD-10-CM

## 2023-11-03 DIAGNOSIS — E876 Hypokalemia: Secondary | ICD-10-CM | POA: Diagnosis present

## 2023-11-03 DIAGNOSIS — N2581 Secondary hyperparathyroidism of renal origin: Secondary | ICD-10-CM | POA: Diagnosis present

## 2023-11-03 DIAGNOSIS — D62 Acute posthemorrhagic anemia: Secondary | ICD-10-CM | POA: Diagnosis present

## 2023-11-03 DIAGNOSIS — K5730 Diverticulosis of large intestine without perforation or abscess without bleeding: Secondary | ICD-10-CM | POA: Diagnosis present

## 2023-11-03 DIAGNOSIS — Z833 Family history of diabetes mellitus: Secondary | ICD-10-CM

## 2023-11-03 DIAGNOSIS — E7800 Pure hypercholesterolemia, unspecified: Secondary | ICD-10-CM | POA: Diagnosis present

## 2023-11-03 DIAGNOSIS — I2489 Other forms of acute ischemic heart disease: Secondary | ICD-10-CM | POA: Diagnosis not present

## 2023-11-03 DIAGNOSIS — E119 Type 2 diabetes mellitus without complications: Secondary | ICD-10-CM

## 2023-11-03 DIAGNOSIS — Z8701 Personal history of pneumonia (recurrent): Secondary | ICD-10-CM

## 2023-11-03 DIAGNOSIS — K31819 Angiodysplasia of stomach and duodenum without bleeding: Secondary | ICD-10-CM | POA: Diagnosis present

## 2023-11-03 DIAGNOSIS — Z8249 Family history of ischemic heart disease and other diseases of the circulatory system: Secondary | ICD-10-CM

## 2023-11-03 DIAGNOSIS — K219 Gastro-esophageal reflux disease without esophagitis: Secondary | ICD-10-CM | POA: Diagnosis present

## 2023-11-03 DIAGNOSIS — I447 Left bundle-branch block, unspecified: Secondary | ICD-10-CM | POA: Diagnosis present

## 2023-11-03 DIAGNOSIS — Z800 Family history of malignant neoplasm of digestive organs: Secondary | ICD-10-CM

## 2023-11-03 DIAGNOSIS — Z951 Presence of aortocoronary bypass graft: Secondary | ICD-10-CM

## 2023-11-03 DIAGNOSIS — Z90710 Acquired absence of both cervix and uterus: Secondary | ICD-10-CM

## 2023-11-03 DIAGNOSIS — D696 Thrombocytopenia, unspecified: Secondary | ICD-10-CM | POA: Diagnosis not present

## 2023-11-03 DIAGNOSIS — R54 Age-related physical debility: Secondary | ICD-10-CM | POA: Diagnosis present

## 2023-11-03 DIAGNOSIS — K5521 Angiodysplasia of colon with hemorrhage: Secondary | ICD-10-CM | POA: Diagnosis present

## 2023-11-03 DIAGNOSIS — I5022 Chronic systolic (congestive) heart failure: Secondary | ICD-10-CM | POA: Diagnosis present

## 2023-11-03 DIAGNOSIS — E039 Hypothyroidism, unspecified: Secondary | ICD-10-CM | POA: Diagnosis present

## 2023-11-03 DIAGNOSIS — Z79899 Other long term (current) drug therapy: Secondary | ICD-10-CM

## 2023-11-03 DIAGNOSIS — I482 Chronic atrial fibrillation, unspecified: Secondary | ICD-10-CM | POA: Diagnosis present

## 2023-11-03 DIAGNOSIS — N189 Chronic kidney disease, unspecified: Secondary | ICD-10-CM | POA: Diagnosis present

## 2023-11-03 DIAGNOSIS — D631 Anemia in chronic kidney disease: Secondary | ICD-10-CM | POA: Diagnosis present

## 2023-11-03 LAB — CBC WITH DIFFERENTIAL/PLATELET
Abs Immature Granulocytes: 0.01 10*3/uL (ref 0.00–0.07)
Basophils Absolute: 0.1 10*3/uL (ref 0.0–0.1)
Basophils Relative: 1 %
Eosinophils Absolute: 0.1 10*3/uL (ref 0.0–0.5)
Eosinophils Relative: 2 %
HCT: 25.8 % — ABNORMAL LOW (ref 36.0–46.0)
Hemoglobin: 8.3 g/dL — ABNORMAL LOW (ref 12.0–15.0)
Immature Granulocytes: 0 %
Lymphocytes Relative: 15 %
Lymphs Abs: 0.8 10*3/uL (ref 0.7–4.0)
MCH: 34.4 pg — ABNORMAL HIGH (ref 26.0–34.0)
MCHC: 32.2 g/dL (ref 30.0–36.0)
MCV: 107.1 fL — ABNORMAL HIGH (ref 80.0–100.0)
Monocytes Absolute: 1 10*3/uL (ref 0.1–1.0)
Monocytes Relative: 19 %
Neutro Abs: 3.5 10*3/uL (ref 1.7–7.7)
Neutrophils Relative %: 63 %
Platelets: 162 10*3/uL (ref 150–400)
RBC: 2.41 MIL/uL — ABNORMAL LOW (ref 3.87–5.11)
RDW: 28.3 % — ABNORMAL HIGH (ref 11.5–15.5)
WBC: 5.5 10*3/uL (ref 4.0–10.5)
nRBC: 0 % (ref 0.0–0.2)

## 2023-11-03 LAB — BASIC METABOLIC PANEL WITH GFR
Anion gap: 15 (ref 5–15)
BUN: 36 mg/dL — ABNORMAL HIGH (ref 8–23)
CO2: 22 mmol/L (ref 22–32)
Calcium: 7.7 mg/dL — ABNORMAL LOW (ref 8.9–10.3)
Chloride: 97 mmol/L — ABNORMAL LOW (ref 98–111)
Creatinine, Ser: 4.29 mg/dL — ABNORMAL HIGH (ref 0.44–1.00)
GFR, Estimated: 10 mL/min — ABNORMAL LOW (ref >60)
Glucose, Bld: 152 mg/dL — ABNORMAL HIGH (ref 70–99)
Potassium: 3.1 mmol/L — ABNORMAL LOW (ref 3.5–5.1)
Sodium: 134 mmol/L — ABNORMAL LOW (ref 135–145)

## 2023-11-03 LAB — POC OCCULT BLOOD, ED: Fecal Occult Bld: POSITIVE — AB

## 2023-11-03 MED ORDER — CALCITRIOL 0.5 MCG PO CAPS
1.7500 ug | ORAL_CAPSULE | ORAL | Status: DC
  Administered 2023-11-08 – 2023-11-13 (×3): 1.75 ug via ORAL
  Filled 2023-11-03: qty 1
  Filled 2023-11-03: qty 7

## 2023-11-03 MED ORDER — LEVOTHYROXINE SODIUM 50 MCG PO TABS
50.0000 ug | ORAL_TABLET | Freq: Every day | ORAL | Status: DC
  Administered 2023-11-04 – 2023-11-14 (×10): 50 ug via ORAL
  Filled 2023-11-03 (×11): qty 1

## 2023-11-03 MED ORDER — POTASSIUM CHLORIDE CRYS ER 20 MEQ PO TBCR
20.0000 meq | EXTENDED_RELEASE_TABLET | Freq: Once | ORAL | Status: AC
  Administered 2023-11-03: 20 meq via ORAL
  Filled 2023-11-03: qty 1

## 2023-11-03 MED ORDER — RENA-VITE PO TABS
1.0000 | ORAL_TABLET | Freq: Every day | ORAL | Status: DC
  Administered 2023-11-03 – 2023-11-14 (×12): 1 via ORAL
  Filled 2023-11-03 (×12): qty 1

## 2023-11-03 MED ORDER — FOLIC ACID 1 MG PO TABS
1.0000 mg | ORAL_TABLET | Freq: Every day | ORAL | Status: DC
  Administered 2023-11-03 – 2023-11-14 (×12): 1 mg via ORAL
  Filled 2023-11-03 (×13): qty 1

## 2023-11-03 MED ORDER — ROSUVASTATIN CALCIUM 5 MG PO TABS
10.0000 mg | ORAL_TABLET | Freq: Every day | ORAL | Status: DC
  Administered 2023-11-03 – 2023-11-13 (×11): 10 mg via ORAL
  Filled 2023-11-03 (×2): qty 2
  Filled 2023-11-03: qty 1
  Filled 2023-11-03: qty 2
  Filled 2023-11-03 (×5): qty 1
  Filled 2023-11-03 (×3): qty 2

## 2023-11-03 MED ORDER — CINACALCET HCL 30 MG PO TABS
30.0000 mg | ORAL_TABLET | ORAL | Status: DC
  Administered 2023-11-08 – 2023-11-13 (×3): 30 mg via ORAL
  Filled 2023-11-03 (×3): qty 1

## 2023-11-03 MED ORDER — ALBUTEROL SULFATE (2.5 MG/3ML) 0.083% IN NEBU: 2.5000 mg | INHALATION_SOLUTION | RESPIRATORY_TRACT | Status: DC | PRN

## 2023-11-03 MED ORDER — OCTREOTIDE ACETATE 20 MG IM KIT: 20.0000 mg | PACK | INTRAMUSCULAR | Status: DC

## 2023-11-03 MED ORDER — LEVETIRACETAM 500 MG PO TABS
500.0000 mg | ORAL_TABLET | Freq: Every day | ORAL | Status: DC
  Administered 2023-11-03 – 2023-11-14 (×12): 500 mg via ORAL
  Filled 2023-11-03 (×13): qty 1

## 2023-11-03 MED ORDER — NITROGLYCERIN 0.4 MG SL SUBL
0.4000 mg | SUBLINGUAL_TABLET | SUBLINGUAL | Status: DC | PRN
  Administered 2023-11-03 – 2023-11-11 (×2): 0.4 mg via SUBLINGUAL
  Filled 2023-11-03 (×2): qty 1

## 2023-11-03 NOTE — ED Provider Notes (Signed)
 [facility] AT [facility] Provider Note   CSN: [id number] Arrival date & time: 11/03/23  1243     History  Chief Complaint  Patient presents with   low hemoglobin    Shawna [name] is a 83 y.o. female past medical history significant for NSTEMI, GI bleed, ESRD, hypertension, bradycardia, GERD, CHF presents today for low hemoglobin.  Patient had blood work yesterday that showed a hemoglobin of 6.9.  Patient reports increased fatigue, dizziness, and dark stools.  Patient states that her last bowel movement was this morning.  Patient denies abdominal pain, nausea, vomiting, diarrhea, chest pain, shortness of breath, or blood thinner use.  Patient was restarted on her metoprolol  and amiodarone  when being discharged on 10/08/2023.  HPI     Home Medications Prior to Admission medications   Medication Sig Start Date End Date Taking? Authorizing Provider  albuterol  (PROVENTIL ) (2.5 MG/3ML) 0.083% nebulizer solution USE 1 VIAL IN NEBULIZER 4 TIMES DAILY AS NEEDED FOR COUGH, WHEEZING, AND SHORTNESS OF BREATH 10/20/23  Yes [provider]  albuterol  (VENTOLIN  HFA) 108 (90 Base) MCG/ACT inhaler SMARTSIG:2 Puff(s) By Mouth Every 4-6 Hours PRN 10/10/23  Yes [provider]  amiodarone  (PACERONE ) 200 MG tablet Take 2 tablets (400 mg total) by mouth 2 (two) times daily for 7 days, THEN 1 tablet (200 mg total) 2 (two) times daily for 23 days. 10/08/23 11/07/23 Yes Wynetta Heckle, MD  benzonatate  (TESSALON ) 100 MG capsule Take 100 mg by mouth 3 (three) times daily as needed for cough.   Yes [provider]  calcitRIOL  (ROCALTROL ) 0.25 MCG capsule Take 7 capsules (1.75 mcg total) by mouth every Monday, Wednesday, and Friday with hemodialysis. 12/21/22  Yes Lesa Rape, MD  cinacalcet  (SENSIPAR ) 30 MG tablet Take 1 tablet (30 mg total) by mouth every Monday, Wednesday, and Friday with hemodialysis. 12/19/22  Yes Lesa Rape, MD  cyanocobalamin  (VITAMIN B12) 1000  MCG/ML injection Inject 1,000 mcg into the muscle every 30 (thirty) days.   Yes [provider]  famotidine  (PEPCID ) 20 MG tablet Take 1 tablet (20 mg total) by mouth daily as needed for heartburn or indigestion. 09/10/23 09/09/24 Yes Johnson, Clanford L, MD  folic acid  (FOLVITE ) 1 MG tablet Take 1 mg by mouth daily. 05/24/22  Yes [provider]  guaifenesin  (ROBITUSSIN) 100 MG/5ML syrup Take 200 mg by mouth 3 (three) times daily as needed for cough.   Yes [provider]  hydrOXYzine  (ATARAX ) 25 MG tablet Take 25 mg by mouth every 6 (six) hours as needed for itching. 06/06/22  Yes [provider]  levETIRAcetam  (KEPPRA ) 500 MG tablet Take 1 tablet (500 mg total) by mouth daily. 11/15/22  Yes Cala Castleman, MD  levothyroxine  (SYNTHROID ) 50 MCG tablet Take 50 mcg by mouth daily. 08/27/23  Yes [provider]  metoprolol  tartrate (LOPRESSOR ) 25 MG tablet Take 0.5 tablets (12.5 mg total) by mouth 2 (two) times daily. 10/08/23  Yes Wynetta Heckle, MD  multivitamin (RENA-VIT) TABS tablet Take 1 tablet by mouth daily.   Yes [provider]  nitroGLYCERIN  (NITROSTAT ) 0.4 MG SL tablet Place 0.4 mg under the tongue every 5 (five) minutes as needed for chest pain.   Yes [provider]  octreotide  (SANDOSTATIN  LAR) 20 MG injection Inject 20 mg into the muscle every 28 (twenty-eight) days. 11/15/22  Yes Cala Castleman, MD  ondansetron  (ZOFRAN -ODT) 4 MG disintegrating tablet Take 4 mg by mouth every 8 (eight) hours as needed  for nausea or vomiting. 05/22/23  Yes [provider]  rosuvastatin  (CRESTOR ) 10 MG tablet Take 1 tablet by mouth once daily 06/06/22  Yes Strader, [place] M, PA-C      Allergies    Amlodipine , Aspirin , Nitrofurantoin, Ranexa  [ranolazine ], Bactrim [sulfamethoxazole-trimethoprim], Iodinated contrast media, Iron , Tylenol  [acetaminophen ], Gabapentin , Sucroferric oxyhydroxide, Levaquin  [levofloxacin  in d5w], Levofloxacin ,  Plavix  [clopidogrel  bisulfate], Protonix  [pantoprazole  sodium], and Venofer [ferric oxide]    Review of Systems   Review of Systems  Constitutional:  Positive for fatigue.  Gastrointestinal:        Melena  Neurological:  Positive for dizziness.    Physical Exam Updated Vital Signs BP (!) 177/66   Pulse (!) 42   Temp 97.7 F (36.5 C) (Oral)   Resp 20   Ht 5\' 2"  (1.575 m)   Wt 54.3 kg   SpO2 100%   BMI 21.90 kg/m  Physical Exam Vitals and nursing note reviewed. Exam conducted with a chaperone present.  Constitutional:      General: She is not in acute distress.    Appearance: She is well-developed.     Comments: Chronically ill-appearing  HENT:     Head: Normocephalic and atraumatic.     Right Ear: External ear normal.     Left Ear: External ear normal.     Nose: Nose normal.  Eyes:     Extraocular Movements: Extraocular movements intact.     Conjunctiva/sclera: Conjunctivae normal.  Cardiovascular:     Rate and Rhythm: Regular rhythm. Bradycardia present.     Pulses: Normal pulses.     Heart sounds: Normal heart sounds. No murmur heard. Pulmonary:     Effort: Pulmonary effort is normal. No respiratory distress.     Breath sounds: Normal breath sounds.  Abdominal:     Palpations: Abdomen is soft.     Tenderness: There is no abdominal tenderness.  Genitourinary:    Rectum: Guaiac result positive.     Comments: Skin tag 12 o'clock position of rectum Musculoskeletal:        General: No swelling.     Cervical back: Neck supple.  Skin:    General: Skin is warm and dry.     Capillary Refill: Capillary refill takes less than 2 seconds.  Neurological:     General: No focal deficit present.     Mental Status: She is alert and oriented to person, place, and time.  Psychiatric:        Mood and Affect: Mood normal.     ED Results / Procedures / Treatments   Labs (all labs ordered are listed, but only abnormal results are displayed) Labs Reviewed  BASIC METABOLIC  PANEL WITH GFR - Abnormal; Notable for the following components:      Result Value   Sodium 134 (*)    Potassium 3.1 (*)    Chloride 97 (*)    Glucose, Bld 152 (*)    BUN 36 (*)    Creatinine, Ser 4.29 (*)    Calcium  7.7 (*)    GFR, Estimated 10 (*)    All other components within normal limits  CBC WITH DIFFERENTIAL/PLATELET - Abnormal; Notable for the following components:   RBC 2.41 (*)    Hemoglobin 8.3 (*)    HCT 25.8 (*)    MCV 107.1 (*)    MCH 34.4 (*)    RDW 28.3 (*)    All other components within normal limits  POC OCCULT BLOOD, ED - Abnormal; Notable for the following components:  Fecal Occult Bld POSITIVE (*)    All other components within normal limits  CBC WITH DIFFERENTIAL/PLATELET  BASIC METABOLIC PANEL WITH GFR  CBC  TYPE AND SCREEN    EKG EKG Interpretation Date/Time:  Friday November 03 2023 13:09:21 EDT Ventricular Rate:  40 PR Interval:  214 QRS Duration:  135 QT Interval:  590 QTC Calculation: 482 R Axis:   49  Text Interpretation: Sinus bradycardia Left bundle branch block Confirmed by Shyrl Doyne [phone]) on 11/03/2023 1:12:13 PM  Radiology No results found.  Procedures Procedures    Medications Ordered in ED Medications  nitroGLYCERIN  (NITROSTAT ) SL tablet 0.4 mg (0.4 mg Sublingual Given 11/03/23 2020)  rosuvastatin  (CRESTOR ) tablet 10 mg (10 mg Oral Given 11/03/23 2154)  calcitRIOL  (ROCALTROL ) capsule 1.75 mcg (has no administration in time range)  cinacalcet  (SENSIPAR ) tablet 30 mg (has no administration in time range)  levothyroxine  (SYNTHROID ) tablet 50 mcg (has no administration in time range)  octreotide  (SANDOSTATIN  LAR) IM injection 20 mg (has no administration in time range)  folic acid  (FOLVITE ) tablet 1 mg (1 mg Oral Given 11/03/23 2154)  levETIRAcetam  (KEPPRA ) tablet 500 mg (500 mg Oral Given 11/03/23 2154)  multivitamin (RENA-VIT) tablet 1 tablet (1 tablet Oral Given 11/03/23 2154)  albuterol  (PROVENTIL ) (2.5 MG/3ML) 0.083%  nebulizer solution 2.5 mg (has no administration in time range)  potassium chloride  SA (KLOR-CON  M) CR tablet 20 mEq (20 mEq Oral Given 11/03/23 1827)    ED Course/ Medical Decision Making/ A&P                                 Medical Decision Making Amount and/or Complexity of Data Reviewed Labs: ordered.  Risk Prescription drug management. Decision regarding hospitalization.   This patient presents to the ED for concern of low hemoglobin, this involves an extensive number of treatment options, and is a complaint that carries with it a high risk of complications and morbidity.  The differential diagnosis includes GI bleed, chronic anemia,   Co morbidities that complicate the patient evaluation  ESRD, CHF, GERD, GI bleed, NSTEMI, A-fib   Additional history obtained:  Additional history obtained from husband External records from outside source obtained and reviewed including previous admission documents   Lab Tests:  I Ordered, and personally interpreted labs.  The pertinent results include: Hyponatremia 134, hypokalemia 3.1, decreased chloride at 97, elevated bun at 36, elevated creatinine at 4.29 consistent with ESRD, decreased calcium  at 7.7, hemoglobin 8.3   Cardiac Monitoring: / EKG:  The patient was maintained on a cardiac monitor.  I personally viewed and interpreted the cardiac monitored which showed an underlying rhythm of: Sinus bradycardia   Problem List / ED Course / Critical interventions / Medication management I ordered medication including potassium chloride  for hypokalemia Reevaluation of the patient after these medicines showed that the patient stayed the same I have reviewed the patients home medicines and have made adjustments as needed   Consultations Obtained:  I requested consultation with the gastroenterology, Dr. Riley Cheadle,  and discussed lab and imaging findings as well as pertinent plan - they recommend: Starting the patient on a PPI and he will  consult on the patient while inpatient Consultation with cardiology, Dr. Amanda Jungling Consultation with hospitalist, Dr. Osborne Blazer he was agreeable to admission for GI bleed Consult with nephrology, Dr. Edson Graces who is adding her to the dialysis list   Test / Admission - Considered:  Patient admitted for symptomatic  anemia        Final Clinical Impression(s) / ED Diagnoses Final diagnoses:  Gastrointestinal hemorrhage, unspecified gastrointestinal hemorrhage type  Bradycardia    Rx / DC Orders ED Discharge Orders     None         Merryl Abraham 11/03/23 2306    Ninetta Basket, MD 11/05/23 [phone]

## 2023-11-03 NOTE — H&P (Addendum)
 TRH H&P   Patient Demographics:    Shawna [name], is a 83 y.o. female  MRN: [id number]   DOB - 09-12-1940  Admit Date - 11/03/2023  Outpatient Primary MD for the patient is [facility]  Referring MD/NP/PA: PA Kayla  Patient coming from: home  Chief Complaint  Patient presents with   low hemoglobin      HPI:    Shawna [name]  is a 83 y.o. female,  with a history of AVMs with recurrent GI bleeding, anemia, HFrEF, CAD s/p CABG on aspirin  81mg , PAF, ESRD on HD MWF, T2DM, and colon CA s/p resection 1992 patient well-known to our service with multiple presentation, hospitalization, has known history of acute on chronic blood loss anemia given history of AVMs requiring multiple admissions for transfusions.   - Presents to ED as instructed by her dialysis center for low hemoglobin as she was told at 6.9, she does report increased fatigue, dizziness, weakness, and dark stools, denies nausea, vomiting or abdominal pain, she is not on any aspirin  or anticoagulation, during recent hospitalization she was discharged on metoprolol  and amiodarone . - In ED labs were significant for stable hemoglobin at 8.3, but she was Hemoccult positive, even though her stool color with no melena, as well she was noted bradycardic heart rate at 41, so triage hospitalist consulted to admit.   Review of systems:      A full 10 point Review of Systems was done, except as stated above, all other Review of Systems were negative.   With Past History of the following :    Past Medical History:  Diagnosis Date   Acute on chronic respiratory failure with hypoxia (HCC) 10/10/2016   Anxiety    Arthritis    AVM (arteriovenous malformation) of colon    CAD (coronary artery disease)    a. s/p CABG in 2013 b. DES to D1 in 10/2016. c. cath in 07/2018 showing patent grafts with occlusion of D1 at prior stent  site and progression of PDA disease --> medical management recommended   Carotid artery disease (HCC)    a. 60-79% LICA, 03/2012    Chronic bronchitis (HCC)    Chronic HFrEF (heart failure with reduced ejection fraction) (HCC)    Colon cancer (HCC) 1992   Esophageal stricture    ESRD on hemodialysis (HCC)    ESRD due to HTN, started dialysis 2011 and gets HD at [facility] with Dr Lieutenant Reese on MWF schedule.  Access is LUA AVF as of Sept 2014.    GERD (gastroesophageal reflux disease)    High cholesterol 12/2011   History of blood transfusion 07/2011; 12/2011; 01/2012 X 2; 04/2012   History of gout    History of lower GI bleeding    Hypertension    Iron  deficiency anemia    Jugular vein occlusion, right (HCC)    Mitral regurgitation    a.  Moderate by echo, 02/2012   Mitral valve disease    NSVT (nonsustained ventricular tachycardia) (HCC)    Ovarian cancer (HCC) 1992   PAF (paroxysmal atrial fibrillation) (HCC)    Pneumonia ~ 2009   PUD (peptic ulcer disease)    TIA (transient ischemic attack)    Tricuspid valve disease       Past Surgical History:  Procedure Laterality Date   A/V FISTULAGRAM Left 10/04/2022   Procedure: A/V Fistulagram;  Surgeon: Jackquelyn Mass, MD;  Location: [facility] INVASIVE CV LAB;  Service: Cardiovascular;  Laterality: Left;   A/V SHUNTOGRAM Left 03/19/2019   Procedure: A/V SHUNTOGRAM;  Surgeon: Jackquelyn Mass, MD;  Location: [facility] INVASIVE CV LAB;  Service: Cardiovascular;  Laterality: Left;   ABDOMINAL HYSTERECTOMY  1992   APPENDECTOMY  06/1990   AV FISTULA PLACEMENT  07/2009   left upper arm   AV FISTULA PLACEMENT Right 09/06/2016   Procedure: RIGHT FOREARM ARTERIOVENOUS (AV) GRAFT;  Surgeon: Richrd Char, MD;  [facility]: [facility] OR;  Service: Vascular;  Laterality: Right;   AV FISTULA PLACEMENT N/A 02/24/2017   Procedure: INSERTION OF ARTERIOVENOUS (AV) GORE-TEX GRAFT ARM (BRACHIAL AXILLARY);  Surgeon: Jackquelyn Mass, MD;  Location: [facility] ORS;  Service:  Vascular;  Laterality: N/A;   AVGG REMOVAL Right 09/06/2016   Procedure: REMOVAL OF Right Arm ARTERIOVENOUS GORETEX GRAFT and Vein Patch angioplasty of brachial artery;  Surgeon: Dannis Dy, MD;  Location: [facility] OR;  Service: Vascular;  Laterality: Right;   BIOPSY  09/26/2019   Procedure: BIOPSY;  Surgeon: Laruth Corporal, MD;  [facility];  Service: Endoscopy;;   BIOPSY  11/01/2022   Procedure: BIOPSY;  Surgeon: Urban Garden, MD;  [facility];  Service: Gastroenterology;;   COLON RESECTION  1992   COLON SURGERY     COLONOSCOPY N/A 03/09/2019   Procedure: COLONOSCOPY;  Surgeon: Ednamae Corporal, MD;  [facility];  Service: Endoscopy;  Laterality: N/A;   COLONOSCOPY N/A 08/11/2022   Procedure: COLONOSCOPY;  Surgeon: Nannette Babe, MD;  Location: [facility] ENDOSCOPY;  Service: Gastroenterology;  Laterality: N/A;   COLONOSCOPY WITH PROPOFOL  N/A 06/08/2021   Procedure: COLONOSCOPY WITH PROPOFOL ;  Surgeon: Urban Garden, MD;  [facility];  Service: Gastroenterology;  Laterality: N/A;  9:05 /Patient is on dialysis Mon Wed Fri   CORONARY ANGIOPLASTY WITH STENT PLACEMENT  12/15/11   "2"   CORONARY ANGIOPLASTY WITH STENT PLACEMENT  y/2013   "1; makes total of 3" (05/02/2012)   CORONARY ARTERY BYPASS GRAFT  06/13/2012   Procedure: CORONARY ARTERY BYPASS GRAFTING (CABG);  Surgeon: Norita Beauvais, MD;  Location: [facility] OR;  Service: Open Heart Surgery;  Laterality: N/A;  cabg x four;  using left internal mammary artery, and left leg greater saphenous vein harvested endoscopically   CORONARY STENT INTERVENTION N/A 10/13/2016   Procedure: Coronary Stent Intervention;  Surgeon: Millicent Ally, MD;  [facility];  Service: Cardiovascular;  Laterality: N/A;   DIALYSIS/PERMA CATHETER REMOVAL N/A 04/18/2017   Procedure: DIALYSIS/PERMA CATHETER REMOVAL;  Surgeon: Jackquelyn Mass, MD;  Location: [facility] INVASIVE CV LAB;  Service:  Cardiovascular;  Laterality: N/A;   DILATION AND CURETTAGE OF UTERUS     ENTEROSCOPY N/A 06/08/2021   Procedure: PUSH ENTEROSCOPY;  Surgeon: Urban Garden, MD;  [facility];  Service: Gastroenterology;  Laterality: N/A;   ENTEROSCOPY N/A 11/01/2022   Procedure: ENTEROSCOPY;  Surgeon: Urban Garden, MD;  [facility];  Service: Gastroenterology;  Laterality: N/A;   ENTEROSCOPY N/A 12/01/2022   Procedure: ENTEROSCOPY;  Surgeon: Ace Holder, MD;  Location: [facility] ENDOSCOPY;  Service: Gastroenterology;  Laterality: N/A;   ESOPHAGOGASTRODUODENOSCOPY  01/20/2012   Procedure: ESOPHAGOGASTRODUODENOSCOPY (EGD);  Surgeon: Asencion Blacksmith, MD,FACG;  Location: [facility] ENDOSCOPY;  Service: Endoscopy;  Laterality: N/A;   ESOPHAGOGASTRODUODENOSCOPY N/A 03/26/2013   Procedure: ESOPHAGOGASTRODUODENOSCOPY (EGD);  Surgeon: Tobin Forts, MD;  Location: [facility] ENDOSCOPY;  Service: Endoscopy;  Laterality: N/A;   ESOPHAGOGASTRODUODENOSCOPY N/A 04/30/2015   Procedure: ESOPHAGOGASTRODUODENOSCOPY (EGD);  Surgeon: Keandrea Corporal, MD;  [facility];  Service: Endoscopy;  Laterality: N/A;  1pm - moved to 10/20 @ 1:10   ESOPHAGOGASTRODUODENOSCOPY N/A 07/29/2016   Procedure: ESOPHAGOGASTRODUODENOSCOPY (EGD);  Surgeon: Danette Duos, MD;  Location: [facility] ENDOSCOPY;  Service: Gastroenterology;  Laterality: N/A;  enteroscopy   ESOPHAGOGASTRODUODENOSCOPY N/A 09/26/2019   Procedure: ESOPHAGOGASTRODUODENOSCOPY (EGD);  Surgeon: Alyzae Corporal, MD;  [facility];  Service: Endoscopy;  Laterality: N/A;  1250   ESOPHAGOGASTRODUODENOSCOPY N/A 08/11/2022   Procedure: ESOPHAGOGASTRODUODENOSCOPY (EGD);  Surgeon: Nannette Babe, MD;  Location: [facility] ENDOSCOPY;  Service: Gastroenterology;  Laterality: N/A;   ESOPHAGOGASTRODUODENOSCOPY (EGD) WITH PROPOFOL  N/A 02/05/2021   Procedure: ESOPHAGOGASTRODUODENOSCOPY (EGD) WITH PROPOFOL ;  Surgeon: Vinetta Greening, DO;  [facility];  Service: Endoscopy;  Laterality: N/A;   ESOPHAGOGASTRODUODENOSCOPY (EGD) WITH PROPOFOL  N/A 08/27/2022   Procedure: ESOPHAGOGASTRODUODENOSCOPY (EGD) WITH PROPOFOL ;  Surgeon: Vinetta Greening, DO;  [facility];  Service: Endoscopy;  Laterality: N/A;   GIVENS CAPSULE STUDY N/A 03/07/2019   Procedure: GIVENS CAPSULE STUDY;  Surgeon: Yeraldy Corporal, MD;  [facility];  Service: Endoscopy;  Laterality: N/A;  7:30   GIVENS CAPSULE STUDY N/A 04/22/2021   Procedure: GIVENS CAPSULE STUDY;  Surgeon: Petrona Corporal, MD;  [facility];  Service: Endoscopy;  Laterality: N/A;  7:30   GIVENS CAPSULE STUDY N/A 08/27/2022   Procedure: GIVENS CAPSULE STUDY;  Surgeon: Vinetta Greening, DO;  [facility];  Service: Endoscopy;  Laterality: N/A;   HOT HEMOSTASIS N/A 08/11/2022   Procedure: HOT HEMOSTASIS (ARGON PLASMA COAGULATION/BICAP);  Surgeon: Nannette Babe, MD;  Location: [facility] ENDOSCOPY;  Service: Gastroenterology;  Laterality: N/A;   HOT HEMOSTASIS N/A 12/01/2022   Procedure: HOT HEMOSTASIS (ARGON PLASMA COAGULATION/BICAP);  Surgeon: Ace Holder, MD;  Location: [facility] ENDOSCOPY;  Service: Gastroenterology;  Laterality: N/A;   INSERTION OF DIALYSIS CATHETER N/A 10/05/2020   Procedure: ABORTED TUNNELED DIALYSIS CATHETER PLACEMENT RIGHT INTERNAL JUGULAR VEIN ;  Surgeon: Awilda Bogus, MD;  Location: [facility]S;  Service: General;  Laterality: N/A;   INTRAOPERATIVE TRANSESOPHAGEAL ECHOCARDIOGRAM  06/13/2012   Procedure: INTRAOPERATIVE TRANSESOPHAGEAL ECHOCARDIOGRAM;  Surgeon: Norita Beauvais, MD;  Location: [facility] OR;  Service: Open Heart Surgery;  Laterality: N/A;   IR DIALY SHUNT INTRO NEEDLE/INTRACATH INITIAL W/IMG LEFT Left 10/06/2020   IR FLUORO GUIDE CV LINE RIGHT  06/17/2020   IR FLUORO GUIDE CV LINE RIGHT  11/12/2022   IR GENERIC HISTORICAL  07/26/2016   IR FLUORO GUIDE CV LINE RIGHT 07/26/2016 Lucinda Saber, MD [facility]   IR GENERIC HISTORICAL  07/26/2016    IR US  GUIDE VASC ACCESS RIGHT 07/26/2016 Lucinda Saber, MD [facility]   IR GENERIC HISTORICAL  08/02/2016   IR US  GUIDE VASC ACCESS RIGHT 08/02/2016 Lucinda Saber, MD [facility]   IR GENERIC HISTORICAL  08/02/2016   IR FLUORO GUIDE  CV LINE RIGHT 08/02/2016 Lucinda Saber, MD [facility]   IR RADIOLOGY PERIPHERAL GUIDED IV START  03/28/2017   IR REMOVAL TUN CV CATH W/O FL  08/11/2020   IR REMOVAL TUN CV CATH W/O FL  11/15/2022   IR THROMBECTOMY AV FISTULA W/THROMBOLYSIS INC/SHUNT/IMG LEFT Left 06/17/2020   IR US  GUIDE VASC ACCESS LEFT  06/17/2020   IR US  GUIDE VASC ACCESS RIGHT  03/28/2017   IR US  GUIDE VASC ACCESS RIGHT  06/17/2020   IR US  GUIDE VASC ACCESS RIGHT  11/12/2022   LEFT HEART CATH AND CORONARY ANGIOGRAPHY N/A 09/20/2016   Procedure: Left Heart Cath and Coronary Angiography;  Surgeon: Arty Binning, MD;  Location: [facility] INVASIVE CV LAB;  Service: Cardiovascular;  Laterality: N/A;   LEFT HEART CATH AND CORS/GRAFTS ANGIOGRAPHY N/A 10/13/2016   Procedure: Left Heart Cath and Cors/Grafts Angiography;  Surgeon: Millicent Ally, MD;  [facility];  Service: Cardiovascular;  Laterality: N/A;   LEFT HEART CATH AND CORS/GRAFTS ANGIOGRAPHY N/A 07/13/2018   Procedure: LEFT HEART CATH AND CORS/GRAFTS ANGIOGRAPHY;  Surgeon: Swaziland, Peter M, MD;  Location: [facility] INVASIVE CV LAB;  Service: Cardiovascular;  Laterality: N/A;   LEFT HEART CATH AND CORS/GRAFTS ANGIOGRAPHY N/A 07/22/2021   Procedure: LEFT HEART CATH AND CORS/GRAFTS ANGIOGRAPHY;  Surgeon: Avanell Leigh, MD;  [facility]VAS[facility] CV LAB;  Service: Cardiovascular;  Laterality: N/A;   LEFT HEART CATHETERIZATION WITH CORONARY ANGIOGRAM N/A 12/15/2011   Procedure: LEFT HEART CATHETERIZATION WITH CORONARY ANGIOGRAM;  Surgeon: Odie Benne, MD;  Location: [facility] CATH LAB;  Service: Cardiovascular;  Laterality: N/A;   LEFT HEART CATHETERIZATION WITH CORONARY ANGIOGRAM N/A 01/10/2012   Procedure: LEFT HEART CATHETERIZATION WITH CORONARY  ANGIOGRAM;  Surgeon: Peter M Swaziland, MD;  Location: [facility] CATH LAB;  Service: Cardiovascular;  Laterality: N/A;   LEFT HEART CATHETERIZATION WITH CORONARY ANGIOGRAM N/A 06/08/2012   Procedure: LEFT HEART CATHETERIZATION WITH CORONARY ANGIOGRAM;  Surgeon: Odie Benne, MD;  Location: [facility] CATH LAB;  Service: Cardiovascular;  Laterality: N/A;   LEFT HEART CATHETERIZATION WITH CORONARY/GRAFT ANGIOGRAM N/A 12/10/2013   Procedure: LEFT HEART CATHETERIZATION WITH Estella Helling;  Surgeon: Lucendia Rusk, MD;  Location: [facility] CATH LAB;  Service: Cardiovascular;  Laterality: N/A;   OVARY SURGERY     ovarian cancer   POLYPECTOMY  03/09/2019   Procedure: POLYPECTOMY;  Surgeon: Sadhana Corporal, MD;  [facility];  Service: Endoscopy;;  cecal    POLYPECTOMY N/A 09/26/2019   Procedure: DUODENAL POLYPECTOMY;  Surgeon: Lester Corporal, MD;  [facility];  Service: Endoscopy;  Laterality: N/A;   POLYPECTOMY  08/11/2022   Procedure: POLYPECTOMY;  Surgeon: Nannette Babe, MD;  Location: [facility] ENDOSCOPY;  Service: Gastroenterology;;   REVISION OF ARTERIOVENOUS GORETEX GRAFT N/A 02/24/2017   Procedure: REVISION OF ARTERIOVENOUS GORETEX GRAFT (RESECTION);  Surgeon: Jackquelyn Mass, MD;  Location: [facility] ORS;  Service: Vascular;  Laterality: N/A;   REVISON OF ARTERIOVENOUS FISTULA Left 06/19/2020   Procedure: REVISION OF LEFT UPPER ARM AV GRAFT WITH INTERPOSITION JUMP GRAFT USING GORE LIMB;  Surgeon: Young Hensen, MD;  Location: [facility] OR;  Service: Vascular;  Laterality: Left;   SHUNTOGRAM N/A 10/15/2013   Procedure: Fistulogram;  Surgeon: Margherita Shell, MD;  Location: [facility] CATH LAB;  Service: Cardiovascular;  Laterality: N/A;   SUBMUCOSAL TATTOO INJECTION  11/01/2022   Procedure: SUBMUCOSAL TATTOO INJECTION;  Surgeon: Urban Garden, MD;  [facility];  Service: Gastroenterology;;  THROMBECTOMY / ARTERIOVENOUS GRAFT REVISION  2011   left upper arm    TUBAL LIGATION  1980's   UPPER EXTREMITY ANGIOGRAPHY Bilateral 12/06/2016   Procedure: Upper Extremity Angiography;  Surgeon: Jackquelyn Mass, MD;  Location: [facility] INVASIVE CV LAB;  Service: Cardiovascular;  Laterality: Bilateral;   UPPER EXTREMITY INTERVENTION Left 06/06/2017   Procedure: UPPER EXTREMITY INTERVENTION;  Surgeon: Jackquelyn Mass, MD;  Location: [facility] INVASIVE CV LAB;  Service: Cardiovascular;  Laterality: Left;   UPPER EXTREMITY VENOGRAPHY Left 10/04/2022   Procedure: UPPER EXTREMITY VENOGRAPHY;  Surgeon: Jackquelyn Mass, MD;  Location: [facility] INVASIVE CV LAB;  Service: Cardiovascular;  Laterality: Left;      Social History:     Social History   Tobacco Use   Smoking status: Never   Smokeless tobacco: Never  Substance Use Topics   Alcohol  use: No    Alcohol /week: 0.0 standard drinks of alcohol        Family History :     Family History  Problem Relation Age of Onset   Heart disease Mother        Heart Disease before age 58   Hyperlipidemia Mother    Hypertension Mother    Diabetes Mother    Heart attack Mother    Heart disease Father        Heart Disease before age 57   Hyperlipidemia Father    Hypertension Father    Diabetes Father    Diabetes Sister    Hypertension Sister    Diabetes Brother    Hyperlipidemia Brother    Heart attack Brother    Hypertension Sister    Heart attack Brother    Colon cancer Child 44   Other Other        noncontributory for early CAD   Esophageal cancer Neg Hx    Liver disease Neg Hx    Kidney disease Neg Hx    Colon polyps Neg Hx       Home Medications:   Prior to Admission medications   Medication Sig Start Date End Date Taking? Authorizing Provider  albuterol  (PROVENTIL ) (2.5 MG/3ML) 0.083% nebulizer solution USE 1 VIAL IN NEBULIZER 4 TIMES DAILY AS NEEDED FOR COUGH, WHEEZING, AND SHORTNESS OF BREATH 10/20/23  Yes [provider]  albuterol  (VENTOLIN  HFA) 108 (90 Base) MCG/ACT inhaler SMARTSIG:2  Puff(s) By Mouth Every 4-6 Hours PRN 10/10/23  Yes [provider]  amiodarone  (PACERONE ) 200 MG tablet Take 2 tablets (400 mg total) by mouth 2 (two) times daily for 7 days, THEN 1 tablet (200 mg total) 2 (two) times daily for 23 days. 10/08/23 11/07/23 Yes Wynetta Heckle, MD  benzonatate  (TESSALON ) 100 MG capsule Take 100 mg by mouth 3 (three) times daily as needed for cough.   Yes [provider]  calcitRIOL  (ROCALTROL ) 0.25 MCG capsule Take 7 capsules (1.75 mcg total) by mouth every Monday, Wednesday, and Friday with hemodialysis. 12/21/22  Yes Lesa Rape, MD  cinacalcet  (SENSIPAR ) 30 MG tablet Take 1 tablet (30 mg total) by mouth every Monday, Wednesday, and Friday with hemodialysis. 12/19/22  Yes Lesa Rape, MD  cyanocobalamin  (VITAMIN B12) 1000 MCG/ML injection Inject 1,000 mcg into the muscle every 30 (thirty) days.   Yes [provider]  famotidine  (PEPCID ) 20 MG tablet Take 1 tablet (20 mg total) by mouth daily as needed for heartburn or indigestion. 09/10/23 09/09/24 Yes Johnson, Clanford L, MD  folic acid  (FOLVITE ) 1 MG tablet Take 1 mg by mouth daily. 05/24/22  Yes  [provider]  guaifenesin  (ROBITUSSIN) 100 MG/5ML syrup Take 200 mg by mouth 3 (three) times daily as needed for cough.   Yes [provider]  hydrOXYzine  (ATARAX ) 25 MG tablet Take 25 mg by mouth every 6 (six) hours as needed for itching. 06/06/22  Yes [provider]  levETIRAcetam  (KEPPRA ) 500 MG tablet Take 1 tablet (500 mg total) by mouth daily. 11/15/22  Yes Singh, Prashant K, MD  levothyroxine  (SYNTHROID ) 50 MCG tablet Take 50 mcg by mouth daily. 08/27/23  Yes [provider]  metoprolol  tartrate (LOPRESSOR ) 25 MG tablet Take 0.5 tablets (12.5 mg total) by mouth 2 (two) times daily. 10/08/23  Yes Wynetta Heckle, MD  multivitamin (RENA-VIT) TABS tablet Take 1 tablet by mouth daily.   Yes [provider]  nitroGLYCERIN  (NITROSTAT ) 0.4 MG SL tablet Place 0.4 mg under  the tongue every 5 (five) minutes as needed for chest pain.   Yes [provider]  octreotide  (SANDOSTATIN  LAR) 20 MG injection Inject 20 mg into the muscle every 28 (twenty-eight) days. 11/15/22  Yes Cala Castleman, MD  ondansetron  (ZOFRAN -ODT) 4 MG disintegrating tablet Take 4 mg by mouth every 8 (eight) hours as needed for nausea or vomiting. 05/22/23  Yes [provider]  rosuvastatin  (CRESTOR ) 10 MG tablet Take 1 tablet by mouth once daily 06/06/22  Yes Strader, Brittany M, PA-C     Allergies:     Allergies  Allergen Reactions   Amlodipine  Swelling   Aspirin  Other (See Comments)    High Doses Mess up her stomach; "makes my bowels have blood in them". Takes 81 mg EC Aspirin     Nitrofurantoin Hives   Ranexa  [Ranolazine ] Other (See Comments)    Myoclonus-hospitalized    Bactrim [Sulfamethoxazole-Trimethoprim] Rash   Iodinated Contrast Media Itching and Other (See Comments)   Iron  Itching and Other (See Comments)    "they gave me iron  in dialysis; had to give me Benadryl  cause I had to have the iron " (05/02/2012)   Tylenol  [Acetaminophen ] Itching and Other (See Comments)    Makes her feet on fire per pt - can tolerate per pt   Gabapentin  Other (See Comments)    Unknown reaction   Sucroferric Oxyhydroxide Other (See Comments)    Unknown   Levaquin  [Levofloxacin  In D5w] Rash   Levofloxacin  Rash and Other (See Comments)   Plavix  [Clopidogrel  Bisulfate] Rash   Protonix  [Pantoprazole  Sodium] Rash   Venofer [Ferric Oxide] Itching and Other (See Comments)    Patient reports using Benadryl  prior to doses as [facility]     Physical Exam:   Vitals  Blood pressure (!) 175/62, pulse (!) 40, temperature 97.8 F (36.6 C), temperature source Oral, resp. rate (!) 22, height 5\' 2"  (1.575 m), weight 57.3 kg, SpO2 99%.   1. General frail elderly female, laying in bed, no apparent distress  2. Normal affect and insight, Not Suicidal or Homicidal, Awake Alert,  Oriented X 3.  3. No F.N deficits, ALL C.Nerves Intact, Strength 5/5 all 4 extremities, Sensation intact all 4 extremities, Plantars down going.  4. Ears and Eyes appear Normal, Conjunctivae clear, PERRLA. Moist Oral Mucosa.  5. Supple Neck, No JVD, No cervical lymphadenopathy appriciated, No Carotid Bruits.  6. Symmetrical Chest wall movement, Good air movement bilaterally, CTAB.  7. Bradycardic , No Gallops, Rubs or Murmurs, No Parasternal Heave.  8. Positive Bowel Sounds, Abdomen Soft, No tenderness, No organomegaly appriciated,No rebound -guarding or rigidity.  9.  No Cyanosis, Normal  Skin Turgor, No Skin Rash or Bruise.  10. Good muscle tone,  joints appear normal , no effusions, Normal ROM.   Data Review:    CBC Recent Labs  Lab 11/03/23 1521  WBC 5.5  HGB 8.3*  HCT 25.8*  PLT 162  MCV 107.1*  MCH 34.4*  MCHC 32.2  RDW 28.3*  LYMPHSABS 0.8  MONOABS 1.0  EOSABS 0.1  BASOSABS 0.1   ------------------------------------------------------------------------------------------------------------------  Chemistries  Recent Labs  Lab 11/03/23 1305  NA 134*  K 3.1*  CL 97*  CO2 22  GLUCOSE 152*  BUN 36*  CREATININE 4.29*  CALCIUM  7.7*   ------------------------------------------------------------------------------------------------------------------ estimated creatinine clearance is 7.9 mL/min (A) (by C-G formula based on SCr of 4.29 mg/dL (H)). ------------------------------------------------------------------------------------------------------------------ No results for input(s): "TSH", "T4TOTAL", "T3FREE", "THYROIDAB" in the last 72 hours.  Invalid input(s): "FREET3"  Coagulation profile No results for input(s): "INR", "PROTIME" in the last 168 hours. ------------------------------------------------------------------------------------------------------------------- No results for input(s): "DDIMER" in the last 72  hours. -------------------------------------------------------------------------------------------------------------------  Cardiac Enzymes No results for input(s): "CKMB", "TROPONINI", "MYOGLOBIN" in the last 168 hours.  Invalid input(s): "CK" ------------------------------------------------------------------------------------------------------------------    Component Value Date/Time   BNP 3,351.0 (H) 05/13/2023 1500     ---------------------------------------------------------------------------------------------------------------  Urinalysis    Component Value Date/Time   COLORURINE YELLOW 12/16/2012 1919   APPEARANCEUR CLOUDY (A) 12/16/2012 1919   LABSPEC 1.009 12/16/2012 1919   PHURINE 7.5 12/16/2012 1919   GLUCOSEU NEGATIVE 12/16/2012 1919   HGBUR TRACE (A) 12/16/2012 1919   BILIRUBINUR NEGATIVE 12/16/2012 1919   KETONESUR NEGATIVE 12/16/2012 1919   PROTEINUR 100 (A) 12/16/2012 1919   UROBILINOGEN 0.2 12/16/2012 1919   NITRITE NEGATIVE 12/16/2012 1919   LEUKOCYTESUR SMALL (A) 12/16/2012 1919    ----------------------------------------------------------------------------------------------------------------   Imaging Results:    No results found.  EKG:  Vent. rate 40 BPM PR interval 214 ms QRS duration 135 ms QT/QTcB 590/482 ms P-R-T axes 95 49 215 Sinus bradycardia Left bundle branch block  Assessment & Plan:    Active Problems:   Acute on chronic blood loss anemia   AVM (arteriovenous malformation) of small bowel, acquired with hemorrhage   Diabetes mellitus type 2 in nonobese [facility])   Acquired hypothyroidism   CAD (coronary artery disease)   Anemia associated with chronic renal failure   Atrial fibrillation, chronic (HCC)   Angiodysplasia of duodenum   Symptomatic anemia    Symptomatic anemia Acute on chronic blood loss anemia Anemia of chronic kidney disease History of AVM Hemoccult positive stool. - Patient with known history of anemia,  history of GI bleed due to small bowel AVM, previous recommendation has been made for supportive transfusion and acute somatostatin which she has been receiving. - Hemoglobin 8.3, around her baseline (overall she required transfusion every month or so where she go from 7 range to 9 range). - Her stool is light brown in color per ED digital exam, but she is Hemoccult positive. - Will await further recommendation from GI, but will monitor CBC closely, and infuse as needed, will keep active type and screen, clear liquid diet for now and n.p.o. after midnight.  Sinus bradycardia History of paroxysmal A-fib with RVR -Bradycardia is most likely in the recent of recently resumed amiodarone  and metoprolol  - Monitor on telemetry, ED physician discussed with cardiology on-call at [facility], who recommended to stop metoprolol  and resume amiodarone  at a lower dose 100 mg oral daily, for now I will hold both until heart rate starts to increase.   - Not  on anticoagulation secondary to recurrent GI bleed  Hypokalemia - She is ESRD patient, received low-dose replacement in ED   ESRD:  - Continue routine HD MWF per nephrology, she missed hemodialysis today, ED discussed with renal, they will dialyze tomorrow.   Hypothyroidism: - Continue with Synthroid    Chronic combined HFrEF, CAD, HTN, HLD: - hx CABG, no chest pain. Last echo Feb 2025 w/LVEF 45-50%, G2DD.  - Volume management with HD. -Continue with statin -Hold beta-blocker due to bradycardia, not a candidate for any antiplatelet therapy due to recurrent GI bleed -Not a candidate for any GDMT due to low blood pressure    DVT Prophylaxis SCDs   AM Labs Ordered, also please review Full Orders  Family Communication: Admission, patients condition and plan of care including tests being ordered have been discussed with the patient and  who indicate understanding and agree with the plan and Code Status.  Code Status DNR, form at bedside , but  intervention desired   Likely DC to  home  Consults called: renal and GI by ED  , ED D/W cardiology  Admission status: observation    Time spent in minutes : 70 minutes   Seena Dadds M.D on 11/03/2023 at 5:48 PM   [facility] - Office  [phone]

## 2023-11-03 NOTE — Care Management Obs Status (Signed)
 MEDICARE OBSERVATION STATUS NOTIFICATION   Patient Details  Name: Shawna [name] MRN: [id number] Date of Birth: 06-08-1941   Medicare Observation Status Notification Given:  Yes    Geraldina Klinefelter, RN 11/03/2023, 10:03 PM

## 2023-11-03 NOTE — Discharge Instructions (Addendum)
 IMPORTANT INFORMATION: PAY CLOSE ATTENTION   PHYSICIAN DISCHARGE INSTRUCTIONS  Follow with Primary care provider  [facility]  and other consultants as instructed by your Hospitalist Physician  SEEK MEDICAL CARE OR RETURN TO EMERGENCY ROOM IF SYMPTOMS COME BACK, WORSEN OR NEW PROBLEM DEVELOPS   Please note: You were cared for by a hospitalist during your hospital stay. Every effort will be made to forward records to your primary care provider.  You can request that your primary care provider send for your hospital records if they have not received them.  Once you are discharged, your primary care physician will handle any further medical issues. Please note that NO REFILLS for any discharge medications will be authorized once you are discharged, as it is imperative that you return to your primary care physician (or establish a relationship with a primary care physician if you do not have one) for your post hospital discharge needs so that they can reassess your need for medications and monitor your lab values.  Please get a complete blood count and chemistry panel checked by your Primary MD at your next visit, and again as instructed by your Primary MD.  Get Medicines reviewed and adjusted: Please take all your medications with you for your next visit with your Primary MD  Laboratory/radiological data: Please request your Primary MD to go over all hospital tests and procedure/radiological results at the follow up, please ask your primary care provider to get all Hospital records sent to his/her office.  In some cases, they will be blood work, cultures and biopsy results pending at the time of your discharge. Please request that your primary care provider follow up on these results.  If you are diabetic, please bring your blood sugar readings with you to your follow up appointment with primary care.    Please call and make your follow up appointments as soon as possible.     Also Note the following: If you experience worsening of your admission symptoms, develop shortness of breath, life threatening emergency, suicidal or homicidal thoughts you must seek medical attention immediately by calling 911 or calling your MD immediately  if symptoms less severe.  You must read complete instructions/literature along with all the possible adverse reactions/side effects for all the Medicines you take and that have been prescribed to you. Take any new Medicines after you have completely understood and accpet all the possible adverse reactions/side effects.   Do not drive when taking Pain medications or sleeping medications (Benzodiazepines)  Do not take more than prescribed Pain, Sleep and Anxiety Medications. It is not advisable to combine anxiety,sleep and pain medications without talking with your primary care practitioner  Special Instructions: If you have smoked or chewed Tobacco  in the last 2 yrs please stop smoking, stop any regular Alcohol   and or any Recreational drug use.  Wear Seat belts while driving.  Do not drive if taking any narcotic, mind altering or controlled substances or recreational drugs or alcohol .   ENT - UTILITIES - HOUSING Agency Name: [place] [employer] Phone: [phone] Website: www.NCForeclosurePrevention.gov Services Offered: Investment banker, operational Name: Abdul Abe. Dept of Health and Human Services/Social Services Address: [phone], [place] Phone: [phone] Website: [email or web address]  Services Offered: Temporary financial assistance, subsidized housing, and utility assistance P a g e  5 [facility] Crises Resources - Updated April 2020  Agency Name: [employer] Address: [address], [place] Phone: [phone] ext. [phone] Email: fpierce@1rha .org Website: http://williams.net/ Services Offered: Subsidized  apartment rent based on income Agency Name: [employer] Address: Sites vary. Must call first.  Phone: [phone] Website: None Services Offered: Food assistance, utility assistance Technical brewer for all of [employer], [employer], [place] , Office manager and [place] for [place] area only), rent  assistance.  Agency Name: [facility] Address: [address], [place] [place] Phone: [phone] [place] Website: OpinionTrades.tn  NetworkAffair.co.za Services Offered: Civil Service fast streamer, food, showers, hygiene products utility payment assistance, thrift  shops, rental assistance

## 2023-11-03 NOTE — Progress Notes (Signed)
   11/03/23 2204  TOC Brief Assessment  Insurance and Status Reviewed  Patient has primary care physician Yes  Home environment has been reviewed From home c/husband  Prior level of function: Assisted  Prior/Current Home Services No current home services  Social Drivers of Health Review SDOH reviewed no interventions necessary  Readmission risk has been reviewed Yes  Transition of care needs transition of care needs identified, TOC will continue to follow   Pt from home c/husband. Husband drives, prepares meals and assists c/meds. Pt recently dc'd from [facility] and would like to resume. TOC to follow.

## 2023-11-03 NOTE — ED Provider Notes (Signed)
 I received a call from cardiology (Dr. Paulita Boss) regarding meds.  Cards recommends giving pt amiodarone  100 mg daily and holding the metoprolol .     Sueellen Emery, MD 11/03/23 [phone]

## 2023-11-03 NOTE — ED Triage Notes (Signed)
 Pt here due to hemoglobin of 6.9 according to pt's most recent blood work.

## 2023-11-04 DIAGNOSIS — N186 End stage renal disease: Secondary | ICD-10-CM | POA: Diagnosis present

## 2023-11-04 DIAGNOSIS — N2581 Secondary hyperparathyroidism of renal origin: Secondary | ICD-10-CM | POA: Diagnosis present

## 2023-11-04 DIAGNOSIS — D696 Thrombocytopenia, unspecified: Secondary | ICD-10-CM | POA: Diagnosis not present

## 2023-11-04 DIAGNOSIS — R627 Adult failure to thrive: Secondary | ICD-10-CM | POA: Diagnosis present

## 2023-11-04 DIAGNOSIS — I132 Hypertensive heart and chronic kidney disease with heart failure and with stage 5 chronic kidney disease, or end stage renal disease: Secondary | ICD-10-CM | POA: Diagnosis present

## 2023-11-04 DIAGNOSIS — I480 Paroxysmal atrial fibrillation: Secondary | ICD-10-CM | POA: Diagnosis present

## 2023-11-04 DIAGNOSIS — R531 Weakness: Secondary | ICD-10-CM | POA: Diagnosis not present

## 2023-11-04 DIAGNOSIS — Z66 Do not resuscitate: Secondary | ICD-10-CM | POA: Diagnosis present

## 2023-11-04 DIAGNOSIS — R001 Bradycardia, unspecified: Secondary | ICD-10-CM | POA: Diagnosis present

## 2023-11-04 DIAGNOSIS — I4892 Unspecified atrial flutter: Secondary | ICD-10-CM | POA: Diagnosis present

## 2023-11-04 DIAGNOSIS — D631 Anemia in chronic kidney disease: Secondary | ICD-10-CM | POA: Diagnosis present

## 2023-11-04 DIAGNOSIS — E039 Hypothyroidism, unspecified: Secondary | ICD-10-CM | POA: Diagnosis present

## 2023-11-04 DIAGNOSIS — I2489 Other forms of acute ischemic heart disease: Secondary | ICD-10-CM | POA: Diagnosis not present

## 2023-11-04 DIAGNOSIS — K31811 Angiodysplasia of stomach and duodenum with bleeding: Secondary | ICD-10-CM | POA: Diagnosis present

## 2023-11-04 DIAGNOSIS — Z992 Dependence on renal dialysis: Secondary | ICD-10-CM | POA: Diagnosis not present

## 2023-11-04 DIAGNOSIS — E1122 Type 2 diabetes mellitus with diabetic chronic kidney disease: Secondary | ICD-10-CM

## 2023-11-04 DIAGNOSIS — K5521 Angiodysplasia of colon with hemorrhage: Secondary | ICD-10-CM

## 2023-11-04 DIAGNOSIS — R4182 Altered mental status, unspecified: Secondary | ICD-10-CM | POA: Diagnosis not present

## 2023-11-04 DIAGNOSIS — I5022 Chronic systolic (congestive) heart failure: Secondary | ICD-10-CM | POA: Diagnosis present

## 2023-11-04 DIAGNOSIS — I083 Combined rheumatic disorders of mitral, aortic and tricuspid valves: Secondary | ICD-10-CM | POA: Diagnosis present

## 2023-11-04 DIAGNOSIS — I25118 Atherosclerotic heart disease of native coronary artery with other forms of angina pectoris: Secondary | ICD-10-CM

## 2023-11-04 DIAGNOSIS — E119 Type 2 diabetes mellitus without complications: Secondary | ICD-10-CM

## 2023-11-04 DIAGNOSIS — E7800 Pure hypercholesterolemia, unspecified: Secondary | ICD-10-CM | POA: Diagnosis present

## 2023-11-04 DIAGNOSIS — N189 Chronic kidney disease, unspecified: Secondary | ICD-10-CM

## 2023-11-04 DIAGNOSIS — D62 Acute posthemorrhagic anemia: Secondary | ICD-10-CM

## 2023-11-04 DIAGNOSIS — I482 Chronic atrial fibrillation, unspecified: Secondary | ICD-10-CM

## 2023-11-04 DIAGNOSIS — D649 Anemia, unspecified: Secondary | ICD-10-CM | POA: Diagnosis not present

## 2023-11-04 DIAGNOSIS — G9341 Metabolic encephalopathy: Secondary | ICD-10-CM | POA: Diagnosis not present

## 2023-11-04 DIAGNOSIS — I2510 Atherosclerotic heart disease of native coronary artery without angina pectoris: Secondary | ICD-10-CM | POA: Diagnosis present

## 2023-11-04 DIAGNOSIS — J810 Acute pulmonary edema: Secondary | ICD-10-CM | POA: Diagnosis not present

## 2023-11-04 DIAGNOSIS — K922 Gastrointestinal hemorrhage, unspecified: Secondary | ICD-10-CM | POA: Diagnosis not present

## 2023-11-04 DIAGNOSIS — I495 Sick sinus syndrome: Secondary | ICD-10-CM | POA: Diagnosis present

## 2023-11-04 DIAGNOSIS — E876 Hypokalemia: Secondary | ICD-10-CM | POA: Diagnosis present

## 2023-11-04 LAB — CBC
HCT: 21.5 % — ABNORMAL LOW (ref 36.0–46.0)
Hemoglobin: 7 g/dL — ABNORMAL LOW (ref 12.0–15.0)
MCH: 33.8 pg (ref 26.0–34.0)
MCHC: 32.6 g/dL (ref 30.0–36.0)
MCV: 103.9 fL — ABNORMAL HIGH (ref 80.0–100.0)
Platelets: 178 10*3/uL (ref 150–400)
RBC: 2.07 MIL/uL — ABNORMAL LOW (ref 3.87–5.11)
RDW: 28.5 % — ABNORMAL HIGH (ref 11.5–15.5)
WBC: 4.9 10*3/uL (ref 4.0–10.5)
nRBC: 0 % (ref 0.0–0.2)

## 2023-11-04 LAB — GLUCOSE, CAPILLARY: Glucose-Capillary: 96 mg/dL (ref 70–99)

## 2023-11-04 LAB — BASIC METABOLIC PANEL WITH GFR
Anion gap: 13 (ref 5–15)
BUN: 40 mg/dL — ABNORMAL HIGH (ref 8–23)
CO2: 21 mmol/L — ABNORMAL LOW (ref 22–32)
Calcium: 7.7 mg/dL — ABNORMAL LOW (ref 8.9–10.3)
Chloride: 102 mmol/L (ref 98–111)
Creatinine, Ser: 4.71 mg/dL — ABNORMAL HIGH (ref 0.44–1.00)
GFR, Estimated: 9 mL/min — ABNORMAL LOW (ref >60)
Glucose, Bld: 133 mg/dL — ABNORMAL HIGH (ref 70–99)
Potassium: 4.2 mmol/L (ref 3.5–5.1)
Sodium: 136 mmol/L (ref 135–145)

## 2023-11-04 LAB — HEPATITIS B SURFACE ANTIGEN: Hepatitis B Surface Ag: NONREACTIVE

## 2023-11-04 LAB — PREPARE RBC (CROSSMATCH)

## 2023-11-04 MED ORDER — CHLORHEXIDINE GLUCONATE CLOTH 2 % EX PADS
6.0000 | MEDICATED_PAD | Freq: Every day | CUTANEOUS | Status: DC
  Administered 2023-11-04 – 2023-11-09 (×6): 6 via TOPICAL

## 2023-11-04 MED ORDER — HEPARIN SODIUM (PORCINE) 1000 UNIT/ML DIALYSIS
1000.0000 [IU] | INTRAMUSCULAR | Status: DC | PRN
  Administered 2023-11-04: 1000 [IU]

## 2023-11-04 MED ORDER — SODIUM CHLORIDE 0.9% IV SOLUTION: Freq: Once | INTRAVENOUS | Status: DC

## 2023-11-04 MED ORDER — ACETAMINOPHEN 500 MG PO TABS
1000.0000 mg | ORAL_TABLET | Freq: Four times a day (QID) | ORAL | Status: DC | PRN
  Administered 2023-11-04 – 2023-11-08 (×5): 1000 mg via ORAL
  Administered 2023-11-10: 650 mg via ORAL
  Administered 2023-11-10 – 2023-11-13 (×5): 1000 mg via ORAL
  Filled 2023-11-04 (×11): qty 2

## 2023-11-04 MED ORDER — ALTEPLASE 2 MG IJ SOLR: 2.0000 mg | Freq: Once | INTRAMUSCULAR | Status: DC | PRN

## 2023-11-04 MED ORDER — HEPARIN SODIUM (PORCINE) 1000 UNIT/ML IJ SOLN
INTRAMUSCULAR | Status: AC
  Filled 2023-11-04: qty 5

## 2023-11-04 MED ORDER — FAMOTIDINE 20 MG PO TABS
20.0000 mg | ORAL_TABLET | Freq: Every day | ORAL | Status: DC | PRN
  Administered 2023-11-04 – 2023-11-11 (×6): 20 mg via ORAL
  Filled 2023-11-04 (×6): qty 1

## 2023-11-04 NOTE — Plan of Care (Signed)
 ?  Problem: Coping: ?Goal: Level of anxiety will decrease ?Outcome: Progressing ?  ?Problem: Safety: ?Goal: Ability to remain free from injury will improve ?Outcome: Progressing ?  ?

## 2023-11-04 NOTE — Plan of Care (Signed)
  Problem: Education: Goal: Knowledge of General Education information will improve Description: Including pain rating scale, medication(s)/side effects and non-pharmacologic comfort measures Outcome: Progressing   Problem: Clinical Measurements: Goal: Ability to maintain clinical measurements within normal limits will improve Outcome: Progressing   Problem: Nutrition: Goal: Adequate nutrition will be maintained Outcome: Progressing   Problem: Skin Integrity: Goal: Risk for impaired skin integrity will decrease Outcome: Progressing   

## 2023-11-04 NOTE — Progress Notes (Signed)
   HEMODIALYSIS TREATMENT NOTE:  3.5 hour heparin -free treatment completed using left internal jugular tDC.  Cath exit site is unremarkable. Goal met: 2.5 liters removed, net.  One unit pRBCs was transfused.  All blood was returned.    Post-HD:  11/04/23 1701  Vital Signs  Temp 97.9 F (36.6 C)  Temp Source Oral  Pulse Rate (!) 50  Pulse Rate Source Monitor  Resp 20  BP (!) 160/55  BP Location Left Arm  BP Method Automatic  Patient Position (if appropriate) Lying  Oxygen  Therapy  SpO2 99 %  O2 Device Room Air  Pain Assessment  Pain Score 0  Dialysis Weight  Weight 55 kg  Type of Weight Post-Dialysis  Post Treatment  Dialyzer Clearance Lightly streaked  Hemodialysis Intake (mL) 355 mL  Liters Processed 83.1  Fluid Removed (mL) 2500 mL  Tolerated HD Treatment Yes  Post-Hemodialysis Comments Goal met   Waunita Haff, RN AP K[facility]

## 2023-11-04 NOTE — Hospital Course (Signed)
 83 y.o. female,  with a history of AVMs with recurrent GI bleeding, anemia, HFrEF, CAD s/p CABG on aspirin  81mg , PAF, ESRD on HD MWF, T2DM, and colon CA s/p resection 1992 patient well-known to our service with multiple presentation, hospitalization, has known history of acute on chronic blood loss anemia given history of AVMs requiring multiple admissions for transfusions.   - Presents to ED as instructed by her dialysis center for low hemoglobin as she was told at 6.9, she does report increased fatigue, dizziness, weakness, and dark stools, denies nausea, vomiting or abdominal pain, she is not on any aspirin  or anticoagulation, during recent hospitalization she was discharged on metoprolol  and amiodarone .  - In ED labs were significant for stable hemoglobin at 8.3, but she was Hemoccult positive, even though her stool color with no melena, as well she was noted bradycardic heart rate at 41, so triage hospitalist consulted to admit.

## 2023-11-04 NOTE — Progress Notes (Addendum)
 PROGRESS NOTE   Shawna [name]  VFI:[id number] DOB: 09/26/1940 DOA: 11/03/2023 PCP: [facility]   Chief Complaint  Patient presents with   low hemoglobin   Level of care: Telemetry  Brief Admission History:  83 y.o. female,  with a history of AVMs with recurrent GI bleeding, anemia, HFrEF, CAD s/p CABG on aspirin  81mg , PAF, ESRD on HD MWF, T2DM, and colon CA s/p resection 1992 patient well-known to our service with multiple presentation, hospitalization, has known history of acute on chronic blood loss anemia given history of AVMs requiring multiple admissions for transfusions.   - Presents to ED as instructed by her dialysis center for low hemoglobin as she was told at 6.9, she does report increased fatigue, dizziness, weakness, and dark stools, denies nausea, vomiting or abdominal pain, she is not on any aspirin  or anticoagulation, during recent hospitalization she was discharged on metoprolol  and amiodarone .  - In ED labs were significant for stable hemoglobin at 8.3, but she was Hemoccult positive, even though her stool color with no melena, as well she was noted bradycardic heart rate at 41, so triage hospitalist consulted to admit.   Assessment and Plan:  Symptomatic anemia Acute on chronic blood loss anemia Anemia of chronic kidney disease History of AVM Hemoccult positive stool. - Patient with known history of anemia, history of GI bleed due to small bowel AVM, previous recommendation has been made for supportive transfusion and acute somatostatin which she has been receiving. - Hemoglobin 7.0 today, plan to transfuse 1 unit PRBC with HD - discussed with GI, no plans for further procedures today; ok to DC home after transfusion with close outpatient follow up with GI for repeat CBC    Sinus bradycardia History of paroxysmal A-fib with RVR -Bradycardia is most likely in the recent of recently resumed amiodarone  and metoprolol  -pt does not seem to be able to tolerate  amiodarone  and metoprolol  given recurrent hospitalizations, hr is in the 40s today, continue to hold amiodarone  and metoprolol , consider home with half dose amiodarone  50 mg    - Not on anticoagulation secondary to recurrent GI bleed - if HR is better after HD, likely can DC home today after HD, otherwise will keep overnight   Hypokalemia - She is ESRD patient, received low-dose replacement in ED    ESRD:  - Continue routine HD MWF per nephrology, she missed hemodialysis yesterday - HD today ordered per Dr. Edson Graces   Hypothyroidism: - Continue with Synthroid    Chronic combined HFrEF, CAD, HTN, HLD: - hx CABG, no chest pain. Last echo Feb 2025 w/LVEF 45-50%, G2DD.  - Volume management with HD. - Continue with statin - Hold beta-blocker due to bradycardia, not a candidate for any antiplatelet therapy due to recurrent GI bleed - Not a candidate for any GDMT due to low blood pressure   DVT prophylaxis: SCDs Code Status: DNR Family Communication: husband Disposition: anticipating DC home after dialysis today if HR is better   Consultants:  Nephrology GI Procedures:  Hemodialysis 11/04/23  Antimicrobials:    Subjective: Pt says she wants to go home, did not want procedures done except dialysis.  She says she has been very weak and tired.   Objective: Vitals:   11/04/23 1305 11/04/23 1317 11/04/23 1319 11/04/23 1330  BP: (!) 140/45 (!) 135/51  (!) 161/53  Pulse: (!) 46     Resp: 20 20 18 20   Temp: 98 F (36.7 C)     TempSrc: Oral  SpO2: 97%     Weight:      Height:       No intake or output data in the 24 hours ending 11/04/23 1351 Filed Weights   11/03/23 1251 11/03/23 1917  Weight: 57.3 kg 54.3 kg   Examination:  General exam: frail, elderly female, awake, alert, cooperative, intermittently confused, Appears calm and comfortable  Respiratory system: Clear to auscultation. Respiratory effort normal. Cardiovascular system: normal S1 & S2 heard. No JVD, murmurs,  rubs, gallops or clicks. No pedal edema. Gastrointestinal system: Abdomen is nondistended, soft and nontender. No organomegaly or masses felt. Normal bowel sounds heard. Central nervous system: Alert and oriented. No focal neurological deficits. Extremities: Symmetric 5 x 5 power. Skin: No rashes, lesions or ulcers. Psychiatry: Judgement and insight appear diminshed. Mood & affect appropriate.   Data Reviewed: I have personally reviewed following labs and imaging studies  CBC: Recent Labs  Lab 11/03/23 1521 11/04/23 0458  WBC 5.5 4.9  NEUTROABS 3.5  --   HGB 8.3* 7.0*  HCT 25.8* 21.5*  MCV 107.1* 103.9*  PLT 162 178    Basic Metabolic Panel: Recent Labs  Lab 11/03/23 1305 11/04/23 0458  NA 134* 136  K 3.1* 4.2  CL 97* 102  CO2 22 21*  GLUCOSE 152* 133*  BUN 36* 40*  CREATININE 4.29* 4.71*  CALCIUM  7.7* 7.7*    CBG: Recent Labs  Lab 11/04/23 1141  GLUCAP 96    No results found for this or any previous visit (from the past 240 hours).   Radiology Studies: No results found.  Scheduled Meds:  sodium chloride    Intravenous Once   [START ON 11/06/2023] calcitRIOL   1.75 mcg Oral Q M,W,F-HD   Chlorhexidine  Gluconate Cloth  6 each Topical Q0600   [START ON 11/06/2023] cinacalcet   30 mg Oral Q M,W,F-HD   folic acid   1 mg Oral Daily   levETIRAcetam   500 mg Oral Daily   levothyroxine   50 mcg Oral Q0600   multivitamin  1 tablet Oral Daily   octreotide   20 mg Intramuscular Q28 days   rosuvastatin   10 mg Oral QHS   Continuous Infusions:   LOS: 0 days   Time spent: 55 mins  Vincen Bejar Lincoln Renshaw, MD How to contact the [facility] Attending or Consulting provider 7A - 7P or covering provider during after hours 7P -7A, for this patient?  Check the care team in [facility] and look for a) attending/consulting [facility] provider listed and b) the [facility] team listed Log into [email or web address] to find provider on call.  Locate the [facility] provider you are looking for under [facility] and page to a  number that you can be directly reached. If you still have difficulty reaching the provider, please page the [facility] (Director on Call) for the Hospitalists listed on amion for assistance.  11/04/2023, 1:51 PM

## 2023-11-04 NOTE — Plan of Care (Signed)
   Problem: Activity: Goal: Risk for activity intolerance will decrease Outcome: Progressing   Problem: Coping: Goal: Level of anxiety will decrease Outcome: Progressing

## 2023-11-04 NOTE — Progress Notes (Signed)
 Patient reported she felt off like she was dizzy. Noted patient not as alert as this morning, more sleepy. Vital signs: T-98.1, P-45, R-14, BP-151/50, O2-99% on room air. CBG 96. Md Lincoln Renshaw made aware. No new orders.

## 2023-11-04 NOTE — Progress Notes (Signed)
 Brief nephrology note: 83 y/o F p/w GIB, ESRD on HD.  Plan for HD today, TDC for access, no heparin .  Communicated with primary team and HD nurse.   Malaysha Arlen Edson Graces,  CKA

## 2023-11-04 NOTE — Consult Note (Signed)
 Referring Provider: No ref. provider found Primary Care Physician:  [facility] Primary Gastroenterologist:  Dr. Sherrell Dodrill  Reason for Consultation: Acute on chronic anemia Hemoccult positive stool  HPI: 83 year old lady with a complex medical history including recurrent GI bleeding secondary to small bowel and gastric AVMs, heart failure, CAD/CABG PAF (not anticoagulated), end-stage renal disease on hemodialysis told to come to the ED last night after having outpatient labs revealing a hemoglobin of 6.9  2 days ago; in the ED her hemoglobin was 8.3 last evening;  she was occult blood positive on digital rectal exam.  She did not want to be admitted but acquiesced and decided to come in the hospital  She has been stable overnight her hemoglobin this morning is 7.0.  No hematemesis or any BM overnight.  Seen by our service back on 3/26 with reported episode of rectal bleeding hemoglobin at that time 8.3 actually better than it had been 2 weeks earlier (7.1).  She received 1 unit of packed RBCs.  No endoscopic intervention.  Extensive GI history multiple enteroscopy's last enteroscopy Dr. Luella Sager 5/24 jejunal and gastric AVMs ablated with APC.  Small polyps noted in the jejunum.  Last colonoscopy February 2024.  Dr. Bridgett Camps.  Found to have end-to-end ileocolonic and end-to-side colocolonic anastomosis small polyps removed.  Moderate diverticulosis in the sigmoid descending segments.  Neoterminal ileum appeared normal.  Path colonic adenoma and duodenal adenoma.  Patient has been treated with octreotide  IM monthly for the last several months.  She is due for her next dose next week.  For hemodialysis today.  Past Medical History:  Diagnosis Date   Acute on chronic respiratory failure with hypoxia (HCC) 10/10/2016   Anxiety    Arthritis    AVM (arteriovenous malformation) of colon    CAD (coronary artery disease)    a. s/p CABG in 2013 b. DES to D1 in 10/2016.  c. cath in 07/2018 showing patent grafts with occlusion of D1 at prior stent site and progression of PDA disease --> medical management recommended   Carotid artery disease (HCC)    a. 60-79% LICA, 03/2012    Chronic bronchitis (HCC)    Chronic HFrEF (heart failure with reduced ejection fraction) (HCC)    Colon cancer (HCC) 1992   Esophageal stricture    ESRD on hemodialysis (HCC)    ESRD due to HTN, started dialysis 2011 and gets HD at [facility] with Dr Lieutenant Reese on MWF schedule.  Access is LUA AVF as of Sept 2014.    GERD (gastroesophageal reflux disease)    High cholesterol 12/2011   History of blood transfusion 07/2011; 12/2011; 01/2012 X 2; 04/2012   History of gout    History of lower GI bleeding    Hypertension    Iron  deficiency anemia    Jugular vein occlusion, right (HCC)    Mitral regurgitation    a. Moderate by echo, 02/2012   Mitral valve disease    NSVT (nonsustained ventricular tachycardia) (HCC)    Ovarian cancer (HCC) 1992   PAF (paroxysmal atrial fibrillation) (HCC)    Pneumonia ~ 2009   PUD (peptic ulcer disease)    TIA (transient ischemic attack)    Tricuspid valve disease     Past Surgical History:  Procedure Laterality Date   A/V FISTULAGRAM Left 10/04/2022   Procedure: A/V Fistulagram;  Surgeon: Jackquelyn Mass, MD;  Location: [facility] INVASIVE CV LAB;  Service: Cardiovascular;  Laterality: Left;   A/V SHUNTOGRAM Left 03/19/2019  Procedure: A/V SHUNTOGRAM;  Surgeon: Jackquelyn Mass, MD;  Location: [facility] INVASIVE CV LAB;  Service: Cardiovascular;  Laterality: Left;   ABDOMINAL HYSTERECTOMY  1992   APPENDECTOMY  06/1990   AV FISTULA PLACEMENT  07/2009   left upper arm   AV FISTULA PLACEMENT Right 09/06/2016   Procedure: RIGHT FOREARM ARTERIOVENOUS (AV) GRAFT;  Surgeon: Richrd Char, MD;  [facility]: [facility] OR;  Service: Vascular;  Laterality: Right;   AV FISTULA PLACEMENT N/A 02/24/2017   Procedure: INSERTION OF ARTERIOVENOUS (AV) GORE-TEX GRAFT ARM (BRACHIAL  AXILLARY);  Surgeon: Jackquelyn Mass, MD;  Location: [facility] ORS;  Service: Vascular;  Laterality: N/A;   AVGG REMOVAL Right 09/06/2016   Procedure: REMOVAL OF Right Arm ARTERIOVENOUS GORETEX GRAFT and Vein Patch angioplasty of brachial artery;  Surgeon: Dannis Dy, MD;  Location: [facility] OR;  Service: Vascular;  Laterality: Right;   BIOPSY  09/26/2019   Procedure: BIOPSY;  Surgeon: Gladiola Corporal, MD;  [facility];  Service: Endoscopy;;   BIOPSY  11/01/2022   Procedure: BIOPSY;  Surgeon: Urban Garden, MD;  [facility];  Service: Gastroenterology;;   COLON RESECTION  1992   COLON SURGERY     COLONOSCOPY N/A 03/09/2019   Procedure: COLONOSCOPY;  Surgeon: Alliana Corporal, MD;  [facility];  Service: Endoscopy;  Laterality: N/A;   COLONOSCOPY N/A 08/11/2022   Procedure: COLONOSCOPY;  Surgeon: Nannette Babe, MD;  Location[facility] ENDOSCOPY;  Service: Gastroenterology;  Laterality: N/A;   COLONOSCOPY WITH PROPOFOL  N/A 06/08/2021   Procedure: COLONOSCOPY WITH PROPOFOL ;  Surgeon: Urban Garden, MD;  [facility];  Service: Gastroenterology;  Laterality: N/A;  9:05 /Patient is on dialysis Mon Wed Fri   CORONARY ANGIOPLASTY WITH STENT PLACEMENT  12/15/11   "2"   CORONARY ANGIOPLASTY WITH STENT PLACEMENT  y/2013   "1; makes total of 3" (05/02/2012)   CORONARY ARTERY BYPASS GRAFT  06/13/2012   Procedure: CORONARY ARTERY BYPASS GRAFTING (CABG);  Surgeon: Norita Beauvais, MD;  Location: [facility] OR;  Service: Open Heart Surgery;  Laterality: N/A;  cabg x four;  using left internal mammary artery, and left leg greater saphenous vein harvested endoscopically   CORONARY STENT INTERVENTION N/A 10/13/2016   Procedure: Coronary Stent Intervention;  Surgeon: Millicent Ally, MD;  [facility];  Service: Cardiovascular;  Laterality: N/A;   DIALYSIS/PERMA CATHETER REMOVAL N/A 04/18/2017   Procedure: DIALYSIS/PERMA CATHETER REMOVAL;   Surgeon: Jackquelyn Mass, MD;  Location: [facility] INVASIVE CV LAB;  Service: Cardiovascular;  Laterality: N/A;   DILATION AND CURETTAGE OF UTERUS     ENTEROSCOPY N/A 06/08/2021   Procedure: PUSH ENTEROSCOPY;  Surgeon: Urban Garden, MD;  [facility];  Service: Gastroenterology;  Laterality: N/A;   ENTEROSCOPY N/A 11/01/2022   Procedure: ENTEROSCOPY;  Surgeon: Urban Garden, MD;  [facility];  Service: Gastroenterology;  Laterality: N/A;   ENTEROSCOPY N/A 12/01/2022   Procedure: ENTEROSCOPY;  Surgeon: Ace Holder, MD;  Location: [facility] ENDOSCOPY;  Service: Gastroenterology;  Laterality: N/A;   ESOPHAGOGASTRODUODENOSCOPY  01/20/2012   Procedure: ESOPHAGOGASTRODUODENOSCOPY (EGD);  Surgeon: Asencion Blacksmith, MD,FACG;  Location: [facility] ENDOSCOPY;  Service: Endoscopy;  Laterality: N/A;   ESOPHAGOGASTRODUODENOSCOPY N/A 03/26/2013   Procedure: ESOPHAGOGASTRODUODENOSCOPY (EGD);  Surgeon: Tobin Forts, MD;  Location: [facility] ENDOSCOPY;  Service: Endoscopy;  Laterality: N/A;   ESOPHAGOGASTRODUODENOSCOPY N/A 04/30/2015   Procedure: ESOPHAGOGASTRODUODENOSCOPY (EGD);  Surgeon: Jacqlyn Corporal, MD;  [facility];  Service: Endoscopy;  Laterality: N/A;  1pm - moved to 10/20 @ 1:10   ESOPHAGOGASTRODUODENOSCOPY N/A 07/29/2016   Procedure: ESOPHAGOGASTRODUODENOSCOPY (EGD);  Surgeon: Danette Duos, MD;  Location: [facility] ENDOSCOPY;  Service: Gastroenterology;  Laterality: N/A;  enteroscopy   ESOPHAGOGASTRODUODENOSCOPY N/A 09/26/2019   Procedure: ESOPHAGOGASTRODUODENOSCOPY (EGD);  Surgeon: Albirtha Corporal, MD;  [facility];  Service: Endoscopy;  Laterality: N/A;  1250   ESOPHAGOGASTRODUODENOSCOPY N/A 08/11/2022   Procedure: ESOPHAGOGASTRODUODENOSCOPY (EGD);  Surgeon: Nannette Babe, MD;  Location: [facility] ENDOSCOPY;  Service: Gastroenterology;  Laterality: N/A;   ESOPHAGOGASTRODUODENOSCOPY (EGD) WITH PROPOFOL  N/A 02/05/2021   Procedure:  ESOPHAGOGASTRODUODENOSCOPY (EGD) WITH PROPOFOL ;  Surgeon: Vinetta Greening, DO;  [facility];  Service: Endoscopy;  Laterality: N/A;   ESOPHAGOGASTRODUODENOSCOPY (EGD) WITH PROPOFOL  N/A 08/27/2022   Procedure: ESOPHAGOGASTRODUODENOSCOPY (EGD) WITH PROPOFOL ;  Surgeon: Vinetta Greening, DO;  [facility];  Service: Endoscopy;  Laterality: N/A;   GIVENS CAPSULE STUDY N/A 03/07/2019   Procedure: GIVENS CAPSULE STUDY;  Surgeon: Brittlyn Corporal, MD;  [facility];  Service: Endoscopy;  Laterality: N/A;  7:30   GIVENS CAPSULE STUDY N/A 04/22/2021   Procedure: GIVENS CAPSULE STUDY;  Surgeon: Cyra Corporal, MD;  [facility];  Service: Endoscopy;  Laterality: N/A;  7:30   GIVENS CAPSULE STUDY N/A 08/27/2022   Procedure: GIVENS CAPSULE STUDY;  Surgeon: Vinetta Greening, DO;  [facility];  Service: Endoscopy;  Laterality: N/A;   HOT HEMOSTASIS N/A 08/11/2022   Procedure: HOT HEMOSTASIS (ARGON PLASMA COAGULATION/BICAP);  Surgeon: Nannette Babe, MD;  Location: [facility] ENDOSCOPY;  Service: Gastroenterology;  Laterality: N/A;   HOT HEMOSTASIS N/A 12/01/2022   Procedure: HOT HEMOSTASIS (ARGON PLASMA COAGULATION/BICAP);  Surgeon: Ace Holder, MD;  Location: [facility] ENDOSCOPY;  Service: Gastroenterology;  Laterality: N/A;   INSERTION OF DIALYSIS CATHETER N/A 10/05/2020   Procedure: ABORTED TUNNELED DIALYSIS CATHETER PLACEMENT RIGHT INTERNAL JUGULAR VEIN ;  Surgeon: Awilda Bogus, MD;  Location: [facility];  Service: General;  Laterality: N/A;   INTRAOPERATIVE TRANSESOPHAGEAL ECHOCARDIOGRAM  06/13/2012   Procedure: INTRAOPERATIVE TRANSESOPHAGEAL ECHOCARDIOGRAM;  Surgeon: Norita Beauvais, MD;  Location: [facility] OR;  Service: Open Heart Surgery;  Laterality: N/A;   IR DIALY SHUNT INTRO NEEDLE/INTRACATH INITIAL W/IMG LEFT Left 10/06/2020   IR FLUORO GUIDE CV LINE RIGHT  06/17/2020   IR FLUORO GUIDE CV LINE RIGHT  11/12/2022   IR GENERIC HISTORICAL  07/26/2016   IR  FLUORO GUIDE CV LINE RIGHT 07/26/2016 Lucinda Saber, MD [facility]   IR GENERIC HISTORICAL  07/26/2016   IR US  GUIDE VASC ACCESS RIGHT 07/26/2016 Lucinda Saber, MD [facility]   IR GENERIC HISTORICAL  08/02/2016   IR US  GUIDE VASC ACCESS RIGHT 08/02/2016 Lucinda Saber, MD [facility]   IR GENERIC HISTORICAL  08/02/2016   IR FLUORO GUIDE CV LINE RIGHT 08/02/2016 Lucinda Saber, MD [facility]   IR RADIOLOGY PERIPHERAL GUIDED IV START  03/28/2017   IR REMOVAL TUN CV CATH W/O FL  08/11/2020   IR REMOVAL TUN CV CATH W/O FL  11/15/2022   IR THROMBECTOMY AV FISTULA W/THROMBOLYSIS INC/SHUNT/IMG LEFT Left 06/17/2020   IR US  GUIDE VASC ACCESS LEFT  06/17/2020   IR US  GUIDE VASC ACCESS RIGHT  03/28/2017   IR US  GUIDE VASC ACCESS RIGHT  06/17/2020   IR US  GUIDE VASC ACCESS RIGHT  11/12/2022   LEFT HEART CATH AND CORONARY ANGIOGRAPHY N/A 09/20/2016   Procedure: Left  Heart Cath and Coronary Angiography;  Surgeon: Arty Binning, MD;  Location: [facility] INVASIVE CV LAB;  Service: Cardiovascular;  Laterality: N/A;   LEFT HEART CATH AND CORS/GRAFTS ANGIOGRAPHY N/A 10/13/2016   Procedure: Left Heart Cath and Cors/Grafts Angiography;  Surgeon: Millicent Ally, MD;  [facility] CV LAB;  Service: Cardiovascular;  Laterality: N/A;   LEFT HEART CATH AND CORS/GRAFTS ANGIOGRAPHY N/A 07/13/2018   Procedure: LEFT HEART CATH AND CORS/GRAFTS ANGIOGRAPHY;  Surgeon: Swaziland, Peter M, MD;  Location: [facility] INVASIVE CV LAB;  Service: Cardiovascular;  Laterality: N/A;   LEFT HEART CATH AND CORS/GRAFTS ANGIOGRAPHY N/A 07/22/2021   Procedure: LEFT HEART CATH AND CORS/GRAFTS ANGIOGRAPHY;  Surgeon: Avanell Leigh, MD;  [facility] CV LAB;  Service: Cardiovascular;  Laterality: N/A;   LEFT HEART CATHETERIZATION WITH CORONARY ANGIOGRAM N/A 12/15/2011   Procedure: LEFT HEART CATHETERIZATION WITH CORONARY ANGIOGRAM;  Surgeon: Odie Benne, MD;  Location: [facility] CATH LAB;  Service: Cardiovascular;  Laterality: N/A;   LEFT HEART  CATHETERIZATION WITH CORONARY ANGIOGRAM N/A 01/10/2012   Procedure: LEFT HEART CATHETERIZATION WITH CORONARY ANGIOGRAM;  Surgeon: Peter M Swaziland, MD;  Location: [facility] CATH LAB;  Service: Cardiovascular;  Laterality: N/A;   LEFT HEART CATHETERIZATION WITH CORONARY ANGIOGRAM N/A 06/08/2012   Procedure: LEFT HEART CATHETERIZATION WITH CORONARY ANGIOGRAM;  Surgeon: Odie Benne, MD;  Location: [facility] CATH LAB;  Service: Cardiovascular;  Laterality: N/A;   LEFT HEART CATHETERIZATION WITH CORONARY/GRAFT ANGIOGRAM N/A 12/10/2013   Procedure: LEFT HEART CATHETERIZATION WITH Estella Helling;  Surgeon: Lucendia Rusk, MD;  Location: [facility] CATH LAB;  Service: Cardiovascular;  Laterality: N/A;   OVARY SURGERY     ovarian cancer   POLYPECTOMY  03/09/2019   Procedure: POLYPECTOMY;  Surgeon: Marlicia Corporal, MD;  [facility];  Service: Endoscopy;;  cecal    POLYPECTOMY N/A 09/26/2019   Procedure: DUODENAL POLYPECTOMY;  Surgeon: Laiana Corporal, MD;  [facility];  Service: Endoscopy;  Laterality: N/A;   POLYPECTOMY  08/11/2022   Procedure: POLYPECTOMY;  Surgeon: Nannette Babe, MD;  Location: [facility] ENDOSCOPY;  Service: Gastroenterology;;   REVISION OF ARTERIOVENOUS GORETEX GRAFT N/A 02/24/2017   Procedure: REVISION OF ARTERIOVENOUS GORETEX GRAFT (RESECTION);  Surgeon: Jackquelyn Mass, MD;  Location: [facility] ORS;  Service: Vascular;  Laterality: N/A;   REVISON OF ARTERIOVENOUS FISTULA Left 06/19/2020   Procedure: REVISION OF LEFT UPPER ARM AV GRAFT WITH INTERPOSITION JUMP GRAFT USING GORE LIMB;  Surgeon: Young Hensen, MD;  Location: [facility] OR;  Service: Vascular;  Laterality: Left;   SHUNTOGRAM N/A 10/15/2013   Procedure: Fistulogram;  Surgeon: Margherita Shell, MD;  Location: [facility] CATH LAB;  Service: Cardiovascular;  Laterality: N/A;   SUBMUCOSAL TATTOO INJECTION  11/01/2022   Procedure: SUBMUCOSAL TATTOO INJECTION;  Surgeon: Urban Garden, MD;  [facility];   Service: Gastroenterology;;   THROMBECTOMY / ARTERIOVENOUS GRAFT REVISION  2011   left upper arm   TUBAL LIGATION  1980's   UPPER EXTREMITY ANGIOGRAPHY Bilateral 12/06/2016   Procedure: Upper Extremity Angiography;  Surgeon: Jackquelyn Mass, MD;  Location: [facility] INVASIVE CV LAB;  Service: Cardiovascular;  Laterality: Bilateral;   UPPER EXTREMITY INTERVENTION Left 06/06/2017   Procedure: UPPER EXTREMITY INTERVENTION;  Surgeon: Jackquelyn Mass, MD;  Location: [facility] INVASIVE CV LAB;  Service: Cardiovascular;  Laterality: Left;   UPPER EXTREMITY VENOGRAPHY Left 10/04/2022   Procedure: UPPER EXTREMITY VENOGRAPHY;  Surgeon: Jackquelyn Mass, MD;  Location: [facility] INVASIVE CV  LAB;  Service: Cardiovascular;  Laterality: Left;    Prior to Admission medications   Medication Sig Start Date End Date Taking? Authorizing Provider  albuterol  (PROVENTIL ) (2.5 MG/3ML) 0.083% nebulizer solution USE 1 VIAL IN NEBULIZER 4 TIMES DAILY AS NEEDED FOR COUGH, WHEEZING, AND SHORTNESS OF BREATH 10/20/23  Yes [provider]  albuterol  (VENTOLIN  HFA) 108 (90 Base) MCG/ACT inhaler SMARTSIG:2 Puff(s) By Mouth Every 4-6 Hours PRN 10/10/23  Yes [provider]  amiodarone  (PACERONE ) 200 MG tablet Take 2 tablets (400 mg total) by mouth 2 (two) times daily for 7 days, THEN 1 tablet (200 mg total) 2 (two) times daily for 23 days. 10/08/23 11/07/23 Yes Wynetta Heckle, MD  benzonatate  (TESSALON ) 100 MG capsule Take 100 mg by mouth 3 (three) times daily as needed for cough.   Yes [provider]  calcitRIOL  (ROCALTROL ) 0.25 MCG capsule Take 7 capsules (1.75 mcg total) by mouth every Monday, Wednesday, and Friday with hemodialysis. 12/21/22  Yes Lesa Rape, MD  cinacalcet  (SENSIPAR ) 30 MG tablet Take 1 tablet (30 mg total) by mouth every Monday, Wednesday, and Friday with hemodialysis. 12/19/22  Yes Lesa Rape, MD  cyanocobalamin  (VITAMIN B12) 1000 MCG/ML injection Inject 1,000 mcg into the muscle every 30  (thirty) days.   Yes [provider]  famotidine  (PEPCID ) 20 MG tablet Take 1 tablet (20 mg total) by mouth daily as needed for heartburn or indigestion. 09/10/23 09/09/24 Yes Johnson, Clanford L, MD  folic acid  (FOLVITE ) 1 MG tablet Take 1 mg by mouth daily. 05/24/22  Yes [provider]  guaifenesin  (ROBITUSSIN) 100 MG/5ML syrup Take 200 mg by mouth 3 (three) times daily as needed for cough.   Yes [provider]  hydrOXYzine  (ATARAX ) 25 MG tablet Take 25 mg by mouth every 6 (six) hours as needed for itching. 06/06/22  Yes [provider]  levETIRAcetam  (KEPPRA ) 500 MG tablet Take 1 tablet (500 mg total) by mouth daily. 11/15/22  Yes Cala Castleman, MD  levothyroxine  (SYNTHROID ) 50 MCG tablet Take 50 mcg by mouth daily. 08/27/23  Yes [provider]  metoprolol  tartrate (LOPRESSOR ) 25 MG tablet Take 0.5 tablets (12.5 mg total) by mouth 2 (two) times daily. 10/08/23  Yes Wynetta Heckle, MD  multivitamin (RENA-VIT) TABS tablet Take 1 tablet by mouth daily.   Yes [provider]  nitroGLYCERIN  (NITROSTAT ) 0.4 MG SL tablet Place 0.4 mg under the tongue every 5 (five) minutes as needed for chest pain.   Yes [provider]  octreotide  (SANDOSTATIN  LAR) 20 MG injection Inject 20 mg into the muscle every 28 (twenty-eight) days. 11/15/22  Yes Cala Castleman, MD  ondansetron  (ZOFRAN -ODT) 4 MG disintegrating tablet Take 4 mg by mouth every 8 (eight) hours as needed for nausea or vomiting. 05/22/23  Yes [provider]  rosuvastatin  (CRESTOR ) 10 MG tablet Take 1 tablet by mouth once daily 06/06/22  Yes Strader, Brittany M, PA-C    Current Facility-Administered Medications  Medication Dose Route Frequency Provider Last Rate Last Admin   acetaminophen  (TYLENOL ) tablet 1,000 mg  1,000 mg Oral Q6H PRN Arnulfo Larch, MD   1,000 mg at 11/04/23 0150   albuterol  (PROVENTIL ) (2.5 MG/3ML) 0.083% nebulizer solution 2.5 mg  2.5 mg Nebulization Q4H  PRN Elgergawy, Dawood S, MD       [START ON 11/06/2023] calcitRIOL  (ROCALTROL ) capsule 1.75 mcg  1.75 mcg Oral Q M,W,F-HD Elgergawy, Dawood S, MD       [START ON 11/06/2023] cinacalcet  (SENSIPAR ) tablet  30 mg  30 mg Oral Q M,W,F-HD Elgergawy, Ardia Kraft, MD       folic acid  (FOLVITE ) tablet 1 mg  1 mg Oral Daily Elgergawy, Dawood S, MD   1 mg at 11/03/23 2154   levETIRAcetam  (KEPPRA ) tablet 500 mg  500 mg Oral Daily Elgergawy, Dawood S, MD   500 mg at 11/03/23 2154   levothyroxine  (SYNTHROID ) tablet 50 mcg  50 mcg Oral Q0600 Elgergawy, Dawood S, MD   50 mcg at 11/04/23 0535   multivitamin (RENA-VIT) tablet 1 tablet  1 tablet Oral Daily Elgergawy, Ardia Kraft, MD   1 tablet at 11/03/23 2154   nitroGLYCERIN  (NITROSTAT ) SL tablet 0.4 mg  0.4 mg Sublingual Q5 min PRN Elgergawy, Dawood S, MD   0.4 mg at 11/03/23 2020   octreotide  (SANDOSTATIN  LAR) IM injection 20 mg  20 mg Intramuscular Q28 days Elgergawy, Ardia Kraft, MD       rosuvastatin  (CRESTOR ) tablet 10 mg  10 mg Oral QHS Elgergawy, Dawood S, MD   10 mg at 11/03/23 2154    Allergies as of 11/03/2023 - Review Complete 11/03/2023  Allergen Reaction Noted   Amlodipine  Swelling 01/19/2015   Aspirin  Other (See Comments) 12/15/2011   Nitrofurantoin Hives 07/08/2009   Ranexa  [ranolazine ] Other (See Comments) 08/30/2020   Bactrim [sulfamethoxazole-trimethoprim] Rash 12/15/2011   Iodinated contrast media Itching and Other (See Comments) 12/15/2011   Iron  Itching and Other (See Comments) 05/02/2012   Tylenol  [acetaminophen ] Itching and Other (See Comments) 07/08/2016   Gabapentin  Other (See Comments) 02/08/2017   Sucroferric oxyhydroxide Other (See Comments) 04/15/2021   Levaquin  [levofloxacin  in d5w] Rash 10/04/2013   Levofloxacin  Rash and Other (See Comments) 04/15/2017   Plavix  [clopidogrel  bisulfate] Rash 01/05/2012   Protonix  [pantoprazole  sodium] Rash 03/21/2013   Venofer [ferric oxide] Itching and Other (See Comments) 05/02/2012    Family History   Problem Relation Age of Onset   Heart disease Mother        Heart Disease before age 35   Hyperlipidemia Mother    Hypertension Mother    Diabetes Mother    Heart attack Mother    Heart disease Father        Heart Disease before age 73   Hyperlipidemia Father    Hypertension Father    Diabetes Father    Diabetes Sister    Hypertension Sister    Diabetes Brother    Hyperlipidemia Brother    Heart attack Brother    Hypertension Sister    Heart attack Brother    Colon cancer Child 1   Other Other        noncontributory for early CAD   Esophageal cancer Neg Hx    Liver disease Neg Hx    Kidney disease Neg Hx    Colon polyps Neg Hx     Social History   Socioeconomic History   Marital status: Married    Spouse name: Amarise Willow   Number of children: 1   Years of education: Not on file   Highest education level: GED or equivalent  Occupational History   Occupation: retired- Architectural technologist- sewed  Tobacco Use   Smoking status: Never   Smokeless tobacco: Never  Vaping Use   Vaping status: Never Used  Substance and Sexual Activity   Alcohol  use: No    Alcohol /week: 0.0 standard drinks of alcohol    Drug use: No   Sexual activity: Not Currently    Birth control/protection: Surgical  Other Topics Concern  Not on file  Social History Narrative   Lives in [place], [place] with husband.  Dialysis pt - [facility].   Social Drivers of Corporate investment banker Strain: Low Risk  (12/14/2018)   Overall Financial Resource Strain (CARDIA)    Difficulty of Paying Living Expenses: Not very hard  Food Insecurity: No Food Insecurity (11/04/2023)   Hunger Vital Sign    Worried About Running Out of Food in the Last Year: Never true    Ran Out of Food in the Last Year: Never true  Transportation Needs: No Transportation Needs (11/04/2023)   PRAPARE - Administrator, Civil Service (Medical): No    Lack of Transportation (Non-Medical): No  Physical Activity: Unknown  (12/14/2018)   Exercise Vital Sign    Days of Exercise per Week: Patient declined    Minutes of Exercise per Session: Patient declined  Stress: No Stress Concern Present (12/14/2018)   [employer] of Occupational Health - Occupational Stress Questionnaire    Feeling of Stress : Only a little  Social Connections: Unknown (11/04/2023)   Social Connection and Isolation Panel [NHANES]    Frequency of Communication with Friends and Family: Patient unable to answer    Frequency of Social Gatherings with Friends and Family: Patient unable to answer    Attends Religious Services: Patient declined    Active Member of Clubs or Organizations: Patient declined    Attends Banker Meetings: Patient declined    Marital Status: Living with partner  Intimate Partner Violence: Not At Risk (11/04/2023)   Humiliation, Afraid, Rape, and Kick questionnaire    Fear of Current or Ex-Partner: No    Emotionally Abused: No    Physically Abused: No    Sexually Abused: No    Review of Systems:  As in history of present illness  Physical Exam: Vital signs in last 24 hours: Temp:  [97.1 F (36.2 C)-97.8 F (36.6 C)] 97.8 F (36.6 C) (04/26 0753) Pulse Rate:  [40-50] 44 (04/26 0753) Resp:  [15-22] 15 (04/26 0753) BP: (139-177)/(48-66) 154/52 (04/26 0753) SpO2:  [91 %-100 %] 100 % (04/26 0753) Weight:  [54.3 kg-57.3 kg] 54.3 kg (04/25 1917) Last BM Date : 11/03/23 General:   Alert, conversant well-oriented no acute distress  Abdomen: Nondistended.  Positive bowel sounds soft and nontender Intake/Output from previous day: No intake/output data recorded. Intake/Output this shift: No intake/output data recorded.  Lab Results: Recent Labs    11/03/23 1521 11/04/23 0458  WBC 5.5 4.9  HGB 8.3* 7.0*  HCT 25.8* 21.5*  PLT 162 178   BMET Recent Labs    11/03/23 1305 11/04/23 0458  NA 134* 136  K 3.1* 4.2  CL 97* 102  CO2 22 21*  GLUCOSE 152* 133*  BUN 36* 40*  CREATININE 4.29*  4.71*  CALCIUM  7.7* 7.7*   LFT No results for input(s): "PROT", "ALBUMIN ", "AST", "ALT", "ALKPHOS", "BILITOT", "BILIDIR", "IBILI" in the last 72 hours. PT/INR No results for input(s): "LABPROT", "INR" in the last 72 hours. Hepatitis Panel No results for input(s): "HEPBSAG", "HCVAB", "HEPAIGM", "HEPBIGM" in the last 72 hours. C-Diff No results for input(s): "CDIFFTOX" in the last 72 hours.  Impression: Very pleasant 83 year old lady with a complex medical history including heart failure, atrial fibrillation on end-stage renal disease admitted to the hospital with acute on chronic anemia.  Distant history of colon cancer.  Multiple surgical resections.  Colonic adenomas removed 2024.  It is likely patient is experiencing intermittent bleeding from known  GI AVMs.  She required a unit of blood about 1 month ago.  She is down essentially 1 gram from posttransfusion 1 month ago.  No other overt bleeding although she is occult blood positive on digital rectal examination.  Recommendations:  Agree with 1 unit transfusion packed RBCs.  I have offered the patient an EGD with enteroscopy with ablation of AVMs, etc. as feasible/appropriate.  Patient tells me that she does not want to have anything done at this time and just wants to go home.  Holding off on further endoscopic evaluation this admission is not unreasonable.  Although she has weathered multiple endoscopic interventions recently, with her advancing age and co-morbidities, endoscopic intervention is becoming a riskier proposition.  I discussed at length with Dr. Lincoln Renshaw.  Agree with 1 unit of packed RBCs.  She should continue with octreotide  IM monthly  From a GI standpoint she could be discharged later today.  Will arrange close interval follow-up with Dr. Sammi Crick in the next 1 to 2 weeks.  I recommend her H&H be rechecked middle of next week.        Notice:  This dictation was prepared with Dragon dictation along with  smaller phrase technology. Any transcriptional errors that result from this process are unintentional and may not be corrected upon review.

## 2023-11-05 ENCOUNTER — Inpatient Hospital Stay (HOSPITAL_COMMUNITY)

## 2023-11-05 DIAGNOSIS — Z66 Do not resuscitate: Secondary | ICD-10-CM

## 2023-11-05 DIAGNOSIS — R4182 Altered mental status, unspecified: Secondary | ICD-10-CM | POA: Diagnosis not present

## 2023-11-05 DIAGNOSIS — R627 Adult failure to thrive: Secondary | ICD-10-CM | POA: Diagnosis not present

## 2023-11-05 DIAGNOSIS — J810 Acute pulmonary edema: Secondary | ICD-10-CM | POA: Diagnosis not present

## 2023-11-05 DIAGNOSIS — R531 Weakness: Secondary | ICD-10-CM

## 2023-11-05 LAB — CBC
HCT: 31 % — ABNORMAL LOW (ref 36.0–46.0)
Hemoglobin: 10.4 g/dL — ABNORMAL LOW (ref 12.0–15.0)
MCH: 33.2 pg (ref 26.0–34.0)
MCHC: 33.5 g/dL (ref 30.0–36.0)
MCV: 99 fL (ref 80.0–100.0)
Platelets: 150 10*3/uL (ref 150–400)
RBC: 3.13 MIL/uL — ABNORMAL LOW (ref 3.87–5.11)
RDW: 27.5 % — ABNORMAL HIGH (ref 11.5–15.5)
WBC: 4.6 10*3/uL (ref 4.0–10.5)
nRBC: 0 % (ref 0.0–0.2)

## 2023-11-05 LAB — TYPE AND SCREEN
ABO/RH(D): O POS
Antibody Screen: NEGATIVE
Donor AG Type: NEGATIVE
Unit division: 0

## 2023-11-05 LAB — BPAM RBC
Blood Product Expiration Date: 202505162359
ISSUE DATE / TIME: 202504261434
Unit Type and Rh: 5100

## 2023-11-05 LAB — HEPATITIS B SURFACE ANTIBODY, QUANTITATIVE: Hep B S AB Quant (Post): 313 m[IU]/mL

## 2023-11-05 MED ORDER — HYDRALAZINE HCL 20 MG/ML IJ SOLN
10.0000 mg | INTRAMUSCULAR | Status: DC | PRN
  Administered 2023-11-10: 10 mg via INTRAVENOUS

## 2023-11-05 MED ORDER — AMIODARONE HCL 200 MG PO TABS
100.0000 mg | ORAL_TABLET | Freq: Every day | ORAL | Status: DC
  Administered 2023-11-05 – 2023-11-07 (×3): 100 mg via ORAL
  Filled 2023-11-05 (×4): qty 1

## 2023-11-05 MED ORDER — HYDRALAZINE HCL 25 MG PO TABS
25.0000 mg | ORAL_TABLET | Freq: Once | ORAL | Status: AC
  Administered 2023-11-05: 25 mg via ORAL
  Filled 2023-11-05: qty 1

## 2023-11-05 MED ORDER — HYDRALAZINE HCL 25 MG PO TABS
25.0000 mg | ORAL_TABLET | Freq: Three times a day (TID) | ORAL | Status: DC
  Administered 2023-11-05 – 2023-11-14 (×24): 25 mg via ORAL
  Filled 2023-11-05 (×25): qty 1

## 2023-11-05 MED ORDER — BENZONATATE 100 MG PO CAPS: 100.0000 mg | ORAL_CAPSULE | Freq: Two times a day (BID) | ORAL | 0 refills | Status: DC | PRN

## 2023-11-05 MED ORDER — AMIODARONE HCL 100 MG PO TABS: 100.0000 mg | ORAL_TABLET | Freq: Every day | ORAL | 1 refills | Status: DC

## 2023-11-05 NOTE — Progress Notes (Signed)
   11/05/23 1040  Vitals  BP (!) 185/77  MAP (mmHg) 107  Level of Consciousness  Level of Consciousness Alert  MEWS COLOR  MEWS Score Color Green  Oxygen  Therapy  SpO2 100 %  Pain Assessment  Pain Scale 0-10  Pain Score 7  Pain Type Acute pain  Pain Location Abdomen  MEWS Score  MEWS Temp 0  MEWS Systolic 0  MEWS Pulse 0  MEWS RR 0  MEWS LOC 0  MEWS Score 0   Tele called stating this pt had a "7 beat run of a widened QRS". Vitals obtained and pt complaining of 10/10 abdominal pain and is lethargic. MD bed side to assess.

## 2023-11-05 NOTE — Progress Notes (Signed)
   11/05/23 1044  Discharge Planning  Type of Residence Private residence  Living Arrangements Spouse/significant other  Home Care Services Yes  Type of Home Care Services Home PT  Home Care Agency (if known) [facility]  Support Systems Spouse/significant other  Does the patient have any problems obtaining your medications? No  Does the patient have difficulty doing errands alone such as visiting a doctor's office or shopping? N  Family/patient expects to be discharged to: Private residence  Once discharged, how will the patient get to their discharge location? Family/Friend - Partnered Transport  Has discharge transport plan been identified? Yes  Expected Discharge Date 11/05/23   CM spoke with patient at bedside. CM spoke with patient's spouse on the phone. Patient spouse reports patient was with [facility].  Patient will be discharge with Home Health [facility]

## 2023-11-05 NOTE — Progress Notes (Signed)
 PROGRESS NOTE   Shawna [name]  VHQ:[id number] DOB: 04/13/1941 DOA: 11/03/2023 PCP: [facility]   Chief Complaint  Patient presents with   low hemoglobin   Level of care: Telemetry  Brief Admission History:  83 y.o. female,  with a history of AVMs with recurrent GI bleeding, anemia, HFrEF, CAD s/p CABG on aspirin  81mg , PAF, ESRD on HD MWF, T2DM, and colon CA s/p resection 1992 patient well-known to our service with multiple presentation, hospitalization, has known history of acute on chronic blood loss anemia given history of AVMs requiring multiple admissions for transfusions.   - Presents to ED as instructed by her dialysis center for low hemoglobin as she was told at 6.9, she does report increased fatigue, dizziness, weakness, and dark stools, denies nausea, vomiting or abdominal pain, she is not on any aspirin  or anticoagulation, during recent hospitalization she was discharged on metoprolol  and amiodarone .  - In ED labs were significant for stable hemoglobin at 8.3, but she was Hemoccult positive, even though her stool color with no melena, as well she was noted bradycardic heart rate at 41, so triage hospitalist consulted to admit.   Assessment and Plan:  Acute Altered Mental State - Pt acutely became lethargic, confused at bedside shortly after she was seen to be alert and conversant - pt was sent for STAT CT head; no acute findings - vitals revealed BP up to 185/77 - STAT CXR -- findings of pulmonary edema  - DISCHARGE HELD TODAY  - she will need more dialysis as she remains volume overloaded - plan for HD on schedule 4/28 with our inpatient nephrology team - I came back to reassess patient after 30 mins and she had returned back to her baseline - I think this is likely metabolic given she remains volume overloaded; no resp distress at this time;  - she is extremely frail and hopefully will return to baseline with next HD treatment   Symptomatic anemia Acute on  chronic blood loss anemia Anemia of chronic kidney disease History of AVM Hemoccult positive stool. - Patient with known history of anemia, history of GI bleed due to small bowel AVM, previous recommendation has been made for supportive transfusion and long acting somatostatin which she has been receiving. - Hemoglobin 7.0, transfused 1 unit PRBC with HD, Hg up to 10 today - discussed with GI, no plans for further procedures this admission  Severe symptomatic Sinus bradycardia History of paroxysmal A-fib with RVR -Bradycardia is most likely in the recent of recently resumed amiodarone  and metoprolol  -pt does not seem to be able to tolerate amiodarone  and metoprolol  given recurrent hospitalizations, hr is in the 40s, initially we held amiodarone  and metoprolol  - ED discussed with cardiologist Dr. Paulita Boss who recommended to reduce amiodarone  to 100 mg and DC metoprolol     - Not on anticoagulation secondary to recurrent GI bleed - when we discontinued both the amio and metoprolol  she returned with PAF with RVR  - for now we have resumed amiodarone  100 mg daily   Uncontrolled HTN - we had to DC the metoprolol  due to severe symptomatic bradycardia - now we are dealing with very high BPs - we have started hydralazine  25 mg TID  - pt currently does not have IV access and staff unable to obtain IV after multiple attempts   Hypokalemia - She is ESRD patient, received low-dose replacement in ED    ESRD:  - Continue routine HD MWF per nephrology, she missed hemodialysis yesterday - HD  today ordered per Dr. Edson Graces   Hypothyroidism: - Continue with Synthroid    Chronic combined HFrEF, CAD, HTN, HLD: - hx CABG, no chest pain. Last echo Feb 2025 w/LVEF 45-50%, G2DD.  - Volume management with HD. - Continue with statin - Hold beta-blocker due to bradycardia, not a candidate for any antiplatelet therapy due to recurrent GI bleed - Not a candidate for any GDMT due to low blood pressure    DVT prophylaxis: SCDs Code Status: DNR Family Communication: husband Disposition: anticipating DC after further workup and stabilization   Consultants:  Nephrology GI Procedures:  Hemodialysis 11/04/23   Antimicrobials:    Subjective: DC was held as she became altered and very lethargic and confused after she was notably more alert and conversant earlier in the morning.   Objective: Vitals:   11/05/23 0421 11/05/23 1040 11/05/23 1107 11/05/23 1310  BP: (!) 145/57 (!) 185/77 (!) 185/77 (!) 173/70  Pulse: (!) 51   60  Resp: 20     Temp: 98.1 F (36.7 C)   97.9 F (36.6 C)  TempSrc: Oral   Oral  SpO2: 97% 100%  94%  Weight:      Height:        Intake/Output Summary (Last 24 hours) at 11/05/2023 1538 Last data filed at 11/05/2023 7846 Gross per 24 hour  Intake 240 ml  Output 2500 ml  Net -2260 ml   Filed Weights   11/03/23 1917 11/04/23 1305 11/04/23 1701  Weight: 54.3 kg 57.5 kg 55 kg   Examination:  General exam: frail, elderly female, awake, initially was alert, then later was lethargic; cooperative, intermittently confused, Appears calm and comfortable  Respiratory system: crackles heard at bases. Cardiovascular system: normal S1 & S2 heard. No JVD, murmurs, rubs, gallops or clicks. No pedal edema. Gastrointestinal system: Abdomen is nondistended, soft and nontender. No organomegaly or masses felt. Normal bowel sounds heard. Central nervous system: Alert and oriented. No focal neurological deficits. Extremities: Symmetric 5 x 5 power. Skin: No rashes, lesions or ulcers. Psychiatry: Judgement and insight appear diminshed. Mood & affect appropriate.   Data Reviewed: I have personally reviewed following labs and imaging studies  CBC: Recent Labs  Lab 11/03/23 1521 11/04/23 0458 11/05/23 0839  WBC 5.5 4.9 4.6  NEUTROABS 3.5  --   --   HGB 8.3* 7.0* 10.4*  HCT 25.8* 21.5* 31.0*  MCV 107.1* 103.9* 99.0  PLT 162 178 150    Basic Metabolic Panel: Recent  Labs  Lab 11/03/23 1305 11/04/23 0458  NA 134* 136  K 3.1* 4.2  CL 97* 102  CO2 22 21*  GLUCOSE 152* 133*  BUN 36* 40*  CREATININE 4.29* 4.71*  CALCIUM  7.7* 7.7*    CBG: Recent Labs  Lab 11/04/23 1141  GLUCAP 96    No results found for this or any previous visit (from the past 240 hours).   Radiology Studies: CT HEAD WO CONTRAST ( ) Result Date: 11/05/2023 CLINICAL DATA:  Mental status change.  Unknown cause. EXAM: CT HEAD WITHOUT CONTRAST TECHNIQUE: Contiguous axial images were obtained from the base of the skull through the vertex without intravenous contrast. RADIATION DOSE REDUCTION: This exam was performed according to the departmental dose-optimization program which includes automated exposure control, adjustment of the mA and/or kV according to patient size and/or use of iterative reconstruction technique. COMPARISON:  CT head without contrast 09/08/2023. MR head without contrast 10/09/2023. FINDINGS: Brain: The thin extra-axial collections or dural thickening is again noted, stable. No acute infarct, hemorrhage,  or mass lesion is present. Atrophy and white matter disease is moderately advanced for age. Remote lacunar infarcts are present in the basal ganglia bilaterally. Vascular: Atherosclerotic calcifications are present within the cavernous internal carotid arteries bilaterally and at the dural margin of both vertebral arteries. Skull: Calvarium is intact. No focal lytic or blastic lesions are present. No significant extracranial soft tissue lesion is present. Sinuses/Orbits: A polyp or mucous retention cyst is again noted in the left sphenoid sinus. The paranasal sinuses and mastoid air cells are otherwise clear. IMPRESSION: 1. No acute intracranial abnormality or significant interval change. 2. Stable thin extra-axial collections or dural thickening. 3. Atrophy and white matter disease is moderately advanced for age. This likely reflects the sequela of chronic microvascular  ischemia. 4. Remote lacunar infarcts in the basal ganglia bilaterally. Electronically Signed   By: Audree Leas M.D.   On: 11/05/2023 13:22   DG CHEST PORT 1 VIEW Result Date: 11/05/2023 CLINICAL DATA:  Cough.  Altered mental status. EXAM: PORTABLE CHEST 1 VIEW COMPARISON:  11/20/2023. FINDINGS: Prominent cardiac silhouette. Pulmonary vascular congestion consistent with interstitial edema and bibasilar consolidation or volume loss. Bilateral small pleural effusions. Postop median sternotomy and CABG. Left-sided dialysis catheter tip distal SVC at the RA/SVC junction. IMPRESSION: Findings consistent with CHF. Electronically Signed   By: Sydell Eva M.D.   On: 11/05/2023 13:15   Scheduled Meds:  sodium chloride    Intravenous Once   amiodarone   100 mg Oral Daily   [START ON 11/06/2023] calcitRIOL   1.75 mcg Oral Q M,W,F-HD   Chlorhexidine  Gluconate Cloth  6 each Topical Q0600   [START ON 11/06/2023] cinacalcet   30 mg Oral Q M,W,F-HD   folic acid   1 mg Oral Daily   levETIRAcetam   500 mg Oral Daily   levothyroxine   50 mcg Oral Q0600   multivitamin  1 tablet Oral Daily   rosuvastatin   10 mg Oral QHS   Continuous Infusions:   LOS: 1 day   Critical Care Procedure Note Authorized and Performed by: Olga Berthold MD  Total Critical Care time:  65 mins (2 separate bedside evaluations) Due to a high probability of clinically significant, life threatening deterioration, the patient required my highest level of preparedness to intervene emergently and I personally spent this critical care time directly and personally managing the patient.  This critical care time included obtaining a history; examining the patient, pulse oximetry; ordering and review of studies; arranging urgent treatment with development of a management plan; evaluation of patient's response of treatment; frequent reassessment; and discussions with other providers.  This critical care time was performed to assess and manage the high  probability of imminent and life threatening deterioration that could result in multi-organ failure.  It was exclusive of separately billable procedures and treating other patients and teaching time.   Faustino Hook, MD How to contact the [facility] Attending or Consulting provider 7A - 7P or covering provider during after hours 7P -7A, for this patient?  Check the care team in [facility] and look for a) attending/consulting [facility] provider listed and b) the [facility] team listed Log into [email or web address] to find provider on call.  Locate the [facility] provider you are looking for under [facility] and page to a number that you can be directly reached. If you still have difficulty reaching the provider, please page the [facility] (Director on Call) for the Hospitalists listed on amion for assistance.  11/05/2023, 3:38 PM

## 2023-11-05 NOTE — TOC CM/SW Note (Signed)
 CM notified patient's spouse patient will not be discharge until tomorrow. CM explained to Mr. Rourke that referral was sent to [facility].

## 2023-11-05 NOTE — Progress Notes (Signed)
   11/05/23 1048  TOC Discharge Assessment  Final next level of care Home w Home Health Services  Once discharged, how will the patient get to their discharge location? Family/Friend - Partnered Transport  Has discharge transport plan been identified? Yes  Barriers to Discharge No Barriers Identified  Patient states their goals for this hospitalization and ongoing recovery are: Home with Home Health  CMS Medicare.gov Compare Post Acute Care list provided to: Patient  Choice offered to / list presented to  Patient  [facility] Arranged PT;RN  [facility]  Date [facility] Contacted 11/04/23  Time [facility] Contacted [phone]  Representative spoke with at [facility] Adelbert Adler requested [facility] order ans discharge summary be faxed to [phone].

## 2023-11-06 ENCOUNTER — Telehealth: Payer: Self-pay | Admitting: Gastroenterology

## 2023-11-06 DIAGNOSIS — I25118 Atherosclerotic heart disease of native coronary artery with other forms of angina pectoris: Secondary | ICD-10-CM | POA: Diagnosis not present

## 2023-11-06 DIAGNOSIS — E039 Hypothyroidism, unspecified: Secondary | ICD-10-CM | POA: Diagnosis not present

## 2023-11-06 DIAGNOSIS — I482 Chronic atrial fibrillation, unspecified: Secondary | ICD-10-CM | POA: Diagnosis not present

## 2023-11-06 DIAGNOSIS — D649 Anemia, unspecified: Secondary | ICD-10-CM | POA: Diagnosis not present

## 2023-11-06 DIAGNOSIS — R001 Bradycardia, unspecified: Secondary | ICD-10-CM | POA: Diagnosis not present

## 2023-11-06 LAB — RENAL FUNCTION PANEL
Albumin: 2.8 g/dL — ABNORMAL LOW (ref 3.5–5.0)
Anion gap: 15 (ref 5–15)
BUN: 32 mg/dL — ABNORMAL HIGH (ref 8–23)
CO2: 22 mmol/L (ref 22–32)
Calcium: 8 mg/dL — ABNORMAL LOW (ref 8.9–10.3)
Chloride: 100 mmol/L (ref 98–111)
Creatinine, Ser: 4.24 mg/dL — ABNORMAL HIGH (ref 0.44–1.00)
GFR, Estimated: 10 mL/min — ABNORMAL LOW (ref >60)
Glucose, Bld: 123 mg/dL — ABNORMAL HIGH (ref 70–99)
Phosphorus: 3.9 mg/dL (ref 2.5–4.6)
Potassium: 3.8 mmol/L (ref 3.5–5.1)
Sodium: 137 mmol/L (ref 135–145)

## 2023-11-06 LAB — CBC
HCT: 29.4 % — ABNORMAL LOW (ref 36.0–46.0)
Hemoglobin: 9.4 g/dL — ABNORMAL LOW (ref 12.0–15.0)
MCH: 33.1 pg (ref 26.0–34.0)
MCHC: 32 g/dL (ref 30.0–36.0)
MCV: 103.5 fL — ABNORMAL HIGH (ref 80.0–100.0)
Platelets: 149 10*3/uL — ABNORMAL LOW (ref 150–400)
RBC: 2.84 MIL/uL — ABNORMAL LOW (ref 3.87–5.11)
RDW: 27.2 % — ABNORMAL HIGH (ref 11.5–15.5)
WBC: 4.1 10*3/uL (ref 4.0–10.5)
nRBC: 0 % (ref 0.0–0.2)

## 2023-11-06 MED ORDER — HYDROXYZINE HCL 10 MG PO TABS
10.0000 mg | ORAL_TABLET | Freq: Three times a day (TID) | ORAL | Status: DC | PRN
  Administered 2023-11-06 – 2023-11-13 (×6): 10 mg via ORAL
  Filled 2023-11-06 (×6): qty 1

## 2023-11-06 MED ORDER — HYDROXYZINE HCL 25 MG PO TABS
25.0000 mg | ORAL_TABLET | Freq: Once | ORAL | Status: AC
  Administered 2023-11-06: 25 mg via ORAL
  Filled 2023-11-06: qty 1

## 2023-11-06 MED ORDER — ISOSORBIDE MONONITRATE ER 30 MG PO TB24
30.0000 mg | ORAL_TABLET | Freq: Every day | ORAL | Status: DC
  Administered 2023-11-06 – 2023-11-14 (×9): 30 mg via ORAL
  Filled 2023-11-06 (×10): qty 1

## 2023-11-06 NOTE — Progress Notes (Signed)
 PROGRESS NOTE   Shawna [name]  ZOX:[id number] DOB: October 19, 1940 DOA: 11/03/2023 PCP: [facility]   Chief Complaint  Patient presents with   low hemoglobin   Level of care: Telemetry  Brief Admission History:  83 y.o. female,  with a history of AVMs with recurrent GI bleeding, anemia, HFrEF, CAD s/p CABG on aspirin  81mg , PAF, ESRD on HD MWF, T2DM, and colon CA s/p resection 1992 patient well-known to our service with multiple presentation, hospitalization, has known history of acute on chronic blood loss anemia given history of AVMs requiring multiple admissions for transfusions.   - Presents to ED as instructed by her dialysis center for low hemoglobin as she was told at 6.9, she does report increased fatigue, dizziness, weakness, and dark stools, denies nausea, vomiting or abdominal pain, she is not on any aspirin  or anticoagulation, during recent hospitalization she was discharged on metoprolol  and amiodarone .  - In ED labs were significant for stable hemoglobin at 8.3, but she was Hemoccult positive, even though her stool color with no melena, as well she was noted bradycardic heart rate at 41, so triage hospitalist consulted to admit.   Assessment and Plan:  Acute metabolic encephalopathy - Pt acutely became lethargic, confused at bedside shortly after she was seen to be alert and conversant - pt was sent for STAT CT head; no acute findings - vitals revealed BP up to 185/77 - STAT CXR -- findings of pulmonary edema - HD planned today 4/28  - DISCHARGE HELD on 4/27  - HD planned for today 4/28 - plan for HD on schedule 4/28 with our inpatient nephrology team - she is extremely frail and hopefully will return to baseline with next HD treatment   Symptomatic anemia Acute on chronic blood loss anemia Anemia of chronic kidney disease History of AVM Hemoccult positive stool - Patient with known history of anemia, history of GI bleed due to small bowel AVM, previous  recommendation has been made for supportive transfusion and long acting somatostatin which she has been receiving. - Hemoglobin 7.0, transfused 1 unit PRBC with HD, Hg 9.5 today - discussed with GI, no plans for further procedures this admission  Severe symptomatic Sinus bradycardia History of paroxysmal A-fib with RVR -Bradycardia is most likely in the recent of recently resumed amiodarone  and metoprolol  -pt does not seem to be able to tolerate amiodarone  and metoprolol  given recurrent hospitalizations, hr is in the 40s, initially we held amiodarone  and metoprolol  - ED discussed with cardiologist Dr. Paulita Boss who recommended to reduce amiodarone  to 100 mg and DC metoprolol     - Not on anticoagulation secondary to recurrent GI bleed - when we discontinued both the amio and metoprolol  she returned with PAF with RVR  - for now we have resumed amiodarone  100 mg daily   Uncontrolled HTN - we had to DC the metoprolol  due to severe symptomatic bradycardia - now we are dealing with very high BPs - we have started hydralazine  25 mg TID  - pt currently does not have IV access and staff unable to obtain IV after multiple attempts   Hypokalemia - She is ESRD patient, received low-dose replacement in ED    ESRD:  - Continue routine HD MWF per nephrology, she missed hemodialysis yesterday - HD today ordered per Dr. Edson Graces   Hypothyroidism: - Continue with Synthroid    Chronic combined HFrEF, CAD, HTN, HLD: - hx CABG, no chest pain. Last echo Feb 2025 w/LVEF 45-50%, G2DD.  - Volume management with HD. -  Continue with statin - Hold beta-blocker due to bradycardia, not a candidate for any antiplatelet therapy due to recurrent GI bleed - Not a candidate for any GDMT due to low blood pressure   DVT prophylaxis: SCDs Code Status: DNR Family Communication: husband Disposition: anticipating DC home tomorrow if ok with nephrology team   Consultants:  Nephrology GI Procedures:   Hemodialysis 11/04/23   Antimicrobials:    Subjective: Pt feeling tired but her mentation seems to be better. She is eating breakfast.   Objective: Vitals:   11/06/23 1230 11/06/23 1300 11/06/23 1358 11/06/23 1430  BP: (!) 134/49 (!) 158/49 (!) 154/60 (!) 174/60  Pulse:   62 63  Resp: 20 17 20    Temp:   98.2 F (36.8 C) 98.2 F (36.8 C)  TempSrc:   Oral Oral  SpO2:   100% 94%  Weight:   50.4 kg   Height:        Intake/Output Summary (Last 24 hours) at 11/06/2023 1607 Last data filed at 11/06/2023 1358 Gross per 24 hour  Intake --  Output 2000 ml  Net -2000 ml   Filed Weights   11/04/23 1701 11/06/23 1035 11/06/23 1358  Weight: 55 kg 52.4 kg 50.4 kg   Examination:  General exam: frail, elderly female, awake, initially was alert, then later was lethargic; cooperative, intermittently confused, Appears calm and comfortable  Respiratory system: crackles heard at bases. Cardiovascular system: normal S1 & S2 heard. No JVD, murmurs, rubs, gallops or clicks. No pedal edema. Gastrointestinal system: Abdomen is nondistended, soft and nontender. No organomegaly or masses felt. Normal bowel sounds heard. Central nervous system: Alert and oriented. No focal neurological deficits. Extremities: Symmetric 5 x 5 power. Skin: No rashes, lesions or ulcers. Psychiatry: Judgement and insight appear diminshed. Mood & affect appropriate.   Data Reviewed: I have personally reviewed following labs and imaging studies  CBC: Recent Labs  Lab 11/03/23 1521 11/04/23 0458 11/05/23 0839 11/06/23 0428  WBC 5.5 4.9 4.6 4.1  NEUTROABS 3.5  --   --   --   HGB 8.3* 7.0* 10.4* 9.4*  HCT 25.8* 21.5* 31.0* 29.4*  MCV 107.1* 103.9* 99.0 103.5*  PLT 162 178 150 149*    Basic Metabolic Panel: Recent Labs  Lab 11/03/23 1305 11/04/23 0458 11/06/23 0428  NA 134* 136 137  K 3.1* 4.2 3.8  CL 97* 102 100  CO2 22 21* 22  GLUCOSE 152* 133* 123*  BUN 36* 40* 32*  CREATININE 4.29* 4.71* 4.24*   CALCIUM  7.7* 7.7* 8.0*  PHOS  --   --  3.9    CBG: Recent Labs  Lab 11/04/23 1141  GLUCAP 96    No results found for this or any previous visit (from the past 240 hours).   Radiology Studies: CT HEAD WO CONTRAST ( ) Result Date: 11/05/2023 CLINICAL DATA:  Mental status change.  Unknown cause. EXAM: CT HEAD WITHOUT CONTRAST TECHNIQUE: Contiguous axial images were obtained from the base of the skull through the vertex without intravenous contrast. RADIATION DOSE REDUCTION: This exam was performed according to the departmental dose-optimization program which includes automated exposure control, adjustment of the mA and/or kV according to patient size and/or use of iterative reconstruction technique. COMPARISON:  CT head without contrast 09/08/2023. MR head without contrast 10/09/2023. FINDINGS: Brain: The thin extra-axial collections or dural thickening is again noted, stable. No acute infarct, hemorrhage, or mass lesion is present. Atrophy and white matter disease is moderately advanced for age. Remote lacunar infarcts are present  in the basal ganglia bilaterally. Vascular: Atherosclerotic calcifications are present within the cavernous internal carotid arteries bilaterally and at the dural margin of both vertebral arteries. Skull: Calvarium is intact. No focal lytic or blastic lesions are present. No significant extracranial soft tissue lesion is present. Sinuses/Orbits: A polyp or mucous retention cyst is again noted in the left sphenoid sinus. The paranasal sinuses and mastoid air cells are otherwise clear. IMPRESSION: 1. No acute intracranial abnormality or significant interval change. 2. Stable thin extra-axial collections or dural thickening. 3. Atrophy and white matter disease is moderately advanced for age. This likely reflects the sequela of chronic microvascular ischemia. 4. Remote lacunar infarcts in the basal ganglia bilaterally. Electronically Signed   By: Audree Leas M.D.    On: 11/05/2023 13:22   DG CHEST PORT 1 VIEW Result Date: 11/05/2023 CLINICAL DATA:  Cough.  Altered mental status. EXAM: PORTABLE CHEST 1 VIEW COMPARISON:  11/20/2023. FINDINGS: Prominent cardiac silhouette. Pulmonary vascular congestion consistent with interstitial edema and bibasilar consolidation or volume loss. Bilateral small pleural effusions. Postop median sternotomy and CABG. Left-sided dialysis catheter tip distal SVC at the RA/SVC junction. IMPRESSION: Findings consistent with CHF. Electronically Signed   By: Sydell Eva M.D.   On: 11/05/2023 13:15   Scheduled Meds:  sodium chloride    Intravenous Once   amiodarone   100 mg Oral Daily   calcitRIOL   1.75 mcg Oral Q M,W,F-HD   Chlorhexidine  Gluconate Cloth  6 each Topical Q0600   cinacalcet   30 mg Oral Q M,W,F-HD   folic acid   1 mg Oral Daily   hydrALAZINE   25 mg Oral Q8H   isosorbide  mononitrate  30 mg Oral Daily   levETIRAcetam   500 mg Oral Daily   levothyroxine   50 mcg Oral Q0600   multivitamin  1 tablet Oral Daily   rosuvastatin   10 mg Oral QHS   Continuous Infusions:   LOS: 2 days   Time spent: 50 mins  Verlaine Embry Lincoln Renshaw, MD How to contact the [facility] Attending or Consulting provider 7A - 7P or covering provider during after hours 7P -7A, for this patient?  Check the care team in [facility] and look for a) attending/consulting [facility] provider listed and b) the [facility] team listed Log into [email or web address] to find provider on call.  Locate the [facility] provider you are looking for under [facility] and page to a number that you can be directly reached. If you still have difficulty reaching the provider, please page the [facility] (Director on Call) for the Hospitalists listed on amion for assistance.  11/06/2023, 4:07 PM

## 2023-11-06 NOTE — Progress Notes (Signed)
 Pt cut off 30 mins d/t tired. Dr. Irene Mannheim approved.  11/06/23 1358  Vitals  Temp 98.2 F (36.8 C)  Temp Source Oral  BP (!) 154/60  BP Location Right Arm  BP Method Automatic  Patient Position (if appropriate) Lying  Pulse Rate 62  Resp 20  Oxygen  Therapy  SpO2 100 %  O2 Device Room Air  During Treatment Monitoring  HD Safety Checks Performed Yes  Intra-Hemodialysis Comments See progress note  Post Treatment  Dialyzer Clearance Lightly streaked  Hemodialysis Intake (mL) 0 mL  Liters Processed 72  Fluid Removed (mL) 2000 mL  Tolerated HD Treatment Yes  Post-Hemodialysis Comments Pt cut off 30 mins d/t tired.  Hemodialysis Catheter Left Subclavian Double lumen Temporary (Non-Tunneled)  Placement Date: 06/09/22   Placed prior to admission: Yes  Orientation: Left  Access Location: Subclavian  Hemodialysis Catheter Type: Double lumen Temporary (Non-Tunneled)  Site Condition No complications  Blue Lumen Status Heparin  locked  Red Lumen Status Heparin  locked  Catheter fill solution Heparin  1000 units/ml  Catheter fill volume (Arterial) 1.9 cc  Catheter fill volume (Venous) 1.9  Dressing Type Transparent  Dressing Status Antimicrobial disc/dressing in place;Clean, Dry, Intact  Interventions New dressing  Drainage Description None  Dressing Change Due 11/11/23  Post treatment catheter status Capped and Clamped

## 2023-11-06 NOTE — Consult Note (Signed)
 [facility] Renal Consultation Note    Indication for Consultation:  Management of ESRD/hemodialysis; anemia, hypertension/volume and secondary hyperparathyroidism  HPI: Shawna [name] is a 83 y.o. female with PMH significant for ESRD (on HD MWF at [facility]), CAD s/p CABG, HFrEF, LBBB, DM type 2, HTN, PAF not on anticoagulation due to recurrent GI bleeds from small bowel AVMs, h/o TIA, colon cancer, and GERD who was presented to [facility] ED after being told by her HD unit that her hgb was low at 6.9. She did c/o increased fatigue and dizziness with dark stools. In the ED, she was afebrile and stable vital signs but HR was 42.  Stool was guaiac +.  Labs were notable for K 3.1, gluc 152, Ca 7.7, Hgb 8.3.  She was admitted and we were  consulted to provide HD during her hospitalization.  She did get HD on 11/04/23.  Past Medical History:  Diagnosis Date   Acute on chronic respiratory failure with hypoxia (HCC) 10/10/2016   Anxiety    Arthritis    AVM (arteriovenous malformation) of colon    CAD (coronary artery disease)    a. s/p CABG in 2013 b. DES to D1 in 10/2016. c. cath in 07/2018 showing patent grafts with occlusion of D1 at prior stent site and progression of PDA disease --> medical management recommended   Carotid artery disease (HCC)    a. 60-79% LICA, 03/2012    Chronic bronchitis (HCC)    Chronic HFrEF (heart failure with reduced ejection fraction) (HCC)    Colon cancer (HCC) 1992   Esophageal stricture    ESRD on hemodialysis (HCC)    ESRD due to HTN, started dialysis 2011 and gets HD at [facility] with Dr Lieutenant Reese on MWF schedule.  Access is LUA AVF as of Sept 2014.    GERD (gastroesophageal reflux disease)    High cholesterol 12/2011   History of blood transfusion 07/2011; 12/2011; 01/2012 X 2; 04/2012   History of gout    History of lower GI bleeding    Hypertension    Iron  deficiency anemia    Jugular vein occlusion, right (HCC)    Mitral regurgitation    a.  Moderate by echo, 02/2012   Mitral valve disease    NSVT (nonsustained ventricular tachycardia) (HCC)    Ovarian cancer (HCC) 1992   PAF (paroxysmal atrial fibrillation) (HCC)    Pneumonia ~ 2009   PUD (peptic ulcer disease)    TIA (transient ischemic attack)    Tricuspid valve disease    Past Surgical History:  Procedure Laterality Date   A/V FISTULAGRAM Left 10/04/2022   Procedure: A/V Fistulagram;  Surgeon: Jackquelyn Mass, MD;  Location: [facility] INVASIVE CV LAB;  Service: Cardiovascular;  Laterality: Left;   A/V SHUNTOGRAM Left 03/19/2019   Procedure: A/V SHUNTOGRAM;  Surgeon: Jackquelyn Mass, MD;  Location: [facility] INVASIVE CV LAB;  Service: Cardiovascular;  Laterality: Left;   ABDOMINAL HYSTERECTOMY  1992   APPENDECTOMY  06/1990   AV FISTULA PLACEMENT  07/2009   left upper arm   AV FISTULA PLACEMENT Right 09/06/2016   Procedure: RIGHT FOREARM ARTERIOVENOUS (AV) GRAFT;  Surgeon: Richrd Char, MD;  Location: [facility] OR;  Service: Vascular;  Laterality: Right;   AV FISTULA PLACEMENT N/A 02/24/2017   Procedure: INSERTION OF ARTERIOVENOUS (AV) GORE-TEX GRAFT ARM (BRACHIAL AXILLARY);  Surgeon: Jackquelyn Mass, MD;  Location: [facility] ORS;  Service: Vascular;  Laterality: N/A;   AVGG REMOVAL Right 09/06/2016   Procedure:  REMOVAL OF Right Arm ARTERIOVENOUS GORETEX GRAFT and Vein Patch angioplasty of brachial artery;  Surgeon: Dannis Dy, MD;  Location: [facility] OR;  Service: Vascular;  Laterality: Right;   BIOPSY  09/26/2019   Procedure: BIOPSY;  Surgeon: Celestia Corporal, MD;  [facility] ENDO [facility];  Service: Endoscopy;;   BIOPSY  11/01/2022   Procedure: BIOPSY;  Surgeon: Urban Garden, MD;  [facility]: [facility];  Service: Gastroenterology;;   COLON RESECTION  1992   COLON SURGERY     COLONOSCOPY N/A 03/09/2019   Procedure: COLONOSCOPY;  Surgeon: Meridith Corporal, MD;  [facility]: [facility];  Service: Endoscopy;  Laterality: N/A;   COLONOSCOPY N/A 08/11/2022   Procedure:  COLONOSCOPY;  Surgeon: Nannette Babe, MD;  Location[facility] ENDOSCOPY;  Service: Gastroenterology;  Laterality: N/A;   COLONOSCOPY WITH PROPOFOL  N/A 06/08/2021   Procedure: COLONOSCOPY WITH PROPOFOL ;  Surgeon: Urban Garden, MD;  [facility];  Service: Gastroenterology;  Laterality: N/A;  9:05 /Patient is on dialysis Mon Wed Fri   CORONARY ANGIOPLASTY WITH STENT PLACEMENT  12/15/11   "2"   CORONARY ANGIOPLASTY WITH STENT PLACEMENT  y/2013   "1; makes total of 3" (05/02/2012)   CORONARY ARTERY BYPASS GRAFT  06/13/2012   Procedure: CORONARY ARTERY BYPASS GRAFTING (CABG);  Surgeon: Norita Beauvais, MD;  Location: [facility] OR;  Service: Open Heart Surgery;  Laterality: N/A;  cabg x four;  using left internal mammary artery, and left leg greater saphenous vein harvested endoscopically   CORONARY STENT INTERVENTION N/A 10/13/2016   Procedure: Coronary Stent Intervention;  Surgeon: Millicent Ally, MD;  [facility] INV[facility] CV LAB;  Service: Cardiovascular;  Laterality: N/A;   DIALYSIS/PERMA CATHETER REMOVAL N/A 04/18/2017   Procedure: DIALYSIS/PERMA CATHETER REMOVAL;  Surgeon: Jackquelyn Mass, MD;  Location: [facility] INVASIVE CV LAB;  Service: Cardiovascular;  Laterality: N/A;   DILATION AND CURETTAGE OF UTERUS     ENTEROSCOPY N/A 06/08/2021   Procedure: PUSH ENTEROSCOPY;  Surgeon: Urban Garden, MD;  [facility];  Service: Gastroenterology;  Laterality: N/A;   ENTEROSCOPY N/A 11/01/2022   Procedure: ENTEROSCOPY;  Surgeon: Urban Garden, MD;  [facility];  Service: Gastroenterology;  Laterality: N/A;   ENTEROSCOPY N/A 12/01/2022   Procedure: ENTEROSCOPY;  Surgeon: Ace Holder, MD;  Location: [facility] ENDOSCOPY;  Service: Gastroenterology;  Laterality: N/A;   ESOPHAGOGASTRODUODENOSCOPY  01/20/2012   Procedure: ESOPHAGOGASTRODUODENOSCOPY (EGD);  Surgeon: Asencion Blacksmith, MD,FACG;  Location: [facility] ENDOSCOPY;  Service: Endoscopy;  Laterality: N/A;    ESOPHAGOGASTRODUODENOSCOPY N/A 03/26/2013   Procedure: ESOPHAGOGASTRODUODENOSCOPY (EGD);  Surgeon: Tobin Forts, MD;  Location: [facility] ENDOSCOPY;  Service: Endoscopy;  Laterality: N/A;   ESOPHAGOGASTRODUODENOSCOPY N/A 04/30/2015   Procedure: ESOPHAGOGASTRODUODENOSCOPY (EGD);  Surgeon: Laurella Corporal, MD;  [facility];  Service: Endoscopy;  Laterality: N/A;  1pm - moved to 10/20 @ 1:10   ESOPHAGOGASTRODUODENOSCOPY N/A 07/29/2016   Procedure: ESOPHAGOGASTRODUODENOSCOPY (EGD);  Surgeon: Danette Duos, MD;  Location: [facility] ENDOSCOPY;  Service: Gastroenterology;  Laterality: N/A;  enteroscopy   ESOPHAGOGASTRODUODENOSCOPY N/A 09/26/2019   Procedure: ESOPHAGOGASTRODUODENOSCOPY (EGD);  Surgeon: Auda Corporal, MD;  [facility];  Service: Endoscopy;  Laterality: N/A;  1250   ESOPHAGOGASTRODUODENOSCOPY N/A 08/11/2022   Procedure: ESOPHAGOGASTRODUODENOSCOPY (EGD);  Surgeon: Nannette Babe, MD;  Location: [facility] ENDOSCOPY;  Service: Gastroenterology;  Laterality: N/A;   ESOPHAGOGASTRODUODENOSCOPY (EGD) WITH PROPOFOL  N/A 02/05/2021   Procedure: ESOPHAGOGASTRODUODENOSCOPY (EGD) WITH PROPOFOL ;  Surgeon: Vinetta Greening,  DO;  [facility];  Service: Endoscopy;  Laterality: N/A;   ESOPHAGOGASTRODUODENOSCOPY (EGD) WITH PROPOFOL  N/A 08/27/2022   Procedure: ESOPHAGOGASTRODUODENOSCOPY (EGD) WITH PROPOFOL ;  Surgeon: Vinetta Greening, DO;  [facility];  Service: Endoscopy;  Laterality: N/A;   GIVENS CAPSULE STUDY N/A 03/07/2019   Procedure: GIVENS CAPSULE STUDY;  Surgeon: Celines Corporal, MD;  [facility];  Service: Endoscopy;  Laterality: N/A;  7:30   GIVENS CAPSULE STUDY N/A 04/22/2021   Procedure: GIVENS CAPSULE STUDY;  Surgeon: Sherald Corporal, MD;  [facility];  Service: Endoscopy;  Laterality: N/A;  7:30   GIVENS CAPSULE STUDY N/A 08/27/2022   Procedure: GIVENS CAPSULE STUDY;  Surgeon: Vinetta Greening, DO;  [facility];  Service:  Endoscopy;  Laterality: N/A;   HOT HEMOSTASIS N/A 08/11/2022   Procedure: HOT HEMOSTASIS (ARGON PLASMA COAGULATION/BICAP);  Surgeon: Nannette Babe, MD;  Location: [facility] ENDOSCOPY;  Service: Gastroenterology;  Laterality: N/A;   HOT HEMOSTASIS N/A 12/01/2022   Procedure: HOT HEMOSTASIS (ARGON PLASMA COAGULATION/BICAP);  Surgeon: Ace Holder, MD;  Location: [facility] ENDOSCOPY;  Service: Gastroenterology;  Laterality: N/A;   INSERTION OF DIALYSIS CATHETER N/A 10/05/2020   Procedure: ABORTED TUNNELED DIALYSIS CATHETER PLACEMENT RIGHT INTERNAL JUGULAR VEIN ;  Surgeon: Awilda Bogus, MD;  Location: [facility] ORS;  Service: General;  Laterality: N/A;   INTRAOPERATIVE TRANSESOPHAGEAL ECHOCARDIOGRAM  06/13/2012   Procedure: INTRAOPERATIVE TRANSESOPHAGEAL ECHOCARDIOGRAM;  Surgeon: Norita Beauvais, MD;  Location: [facility] OR;  Service: Open Heart Surgery;  Laterality: N/A;   IR DIALY SHUNT INTRO NEEDLE/INTRACATH INITIAL W/IMG LEFT Left 10/06/2020   IR FLUORO GUIDE CV LINE RIGHT  06/17/2020   IR FLUORO GUIDE CV LINE RIGHT  11/12/2022   IR GENERIC HISTORICAL  07/26/2016   IR FLUORO GUIDE CV LINE RIGHT 07/26/2016 Lucinda Saber, MD [facility]   IR GENERIC HISTORICAL  07/26/2016   IR US  GUIDE VASC ACCESS RIGHT 07/26/2016 Lucinda Saber, MD [facility]   IR GENERIC HISTORICAL  08/02/2016   IR US  GUIDE VASC ACCESS RIGHT 08/02/2016 Lucinda Saber, MD [facility]   IR GENERIC HISTORICAL  08/02/2016   IR FLUORO GUIDE CV LINE RIGHT 08/02/2016 Lucinda Saber, MD [facility]   IR RADIOLOGY PERIPHERAL GUIDED IV START  03/28/2017   IR REMOVAL TUN CV CATH W/O FL  08/11/2020   IR REMOVAL TUN CV CATH W/O FL  11/15/2022   IR THROMBECTOMY AV FISTULA W/THROMBOLYSIS INC/SHUNT/IMG LEFT Left 06/17/2020   IR US  GUIDE VASC ACCESS LEFT  06/17/2020   IR US  GUIDE VASC ACCESS RIGHT  03/28/2017   IR US  GUIDE VASC ACCESS RIGHT  06/17/2020   IR US  GUIDE VASC ACCESS RIGHT  11/12/2022   LEFT HEART CATH AND CORONARY ANGIOGRAPHY N/A 09/20/2016   Procedure: Left  Heart Cath and Coronary Angiography;  Surgeon: Arty Binning, MD;  Location: [facility] INVASIVE CV LAB;  Service: Cardiovascular;  Laterality: N/A;   LEFT HEART CATH AND CORS/GRAFTS ANGIOGRAPHY N/A 10/13/2016   Procedure: Left Heart Cath and Cors/Grafts Angiography;  Surgeon: Millicent Ally, MD;  [facility]: [facility] INVAS[facility] CV LAB;  Service: Cardiovascular;  Laterality: N/A;   LEFT HEART CATH AND CORS/GRAFTS ANGIOGRAPHY N/A 07/13/2018   Procedure: LEFT HEART CATH AND CORS/GRAFTS ANGIOGRAPHY;  Surgeon: Swaziland, Peter M, MD;  Location: [facility] INVASIVE CV LAB;  Service: Cardiovascular;  Laterality: N/A;   LEFT HEART CATH AND CORS/GRAFTS ANGIOGRAPHY N/A 07/22/2021   Procedure: LEFT HEART CATH AND CORS/GRAFTS ANGIOGRAPHY;  Surgeon: Katheryne Pane,  Frederico Jan, MD;  Location: [facility] INVASIVE CV LAB;  Service: Cardiovascular;  Laterality: N/A;   LEFT HEART CATHETERIZATION WITH CORONARY ANGIOGRAM N/A 12/15/2011   Procedure: LEFT HEART CATHETERIZATION WITH CORONARY ANGIOGRAM;  Surgeon: Odie Benne, MD;  Location: [facility] CATH LAB;  Service: Cardiovascular;  Laterality: N/A;   LEFT HEART CATHETERIZATION WITH CORONARY ANGIOGRAM N/A 01/10/2012   Procedure: LEFT HEART CATHETERIZATION WITH CORONARY ANGIOGRAM;  Surgeon: Peter M Swaziland, MD;  Location: [facility] CATH LAB;  Service: Cardiovascular;  Laterality: N/A;   LEFT HEART CATHETERIZATION WITH CORONARY ANGIOGRAM N/A 06/08/2012   Procedure: LEFT HEART CATHETERIZATION WITH CORONARY ANGIOGRAM;  Surgeon: Odie Benne, MD;  Location: [facility] CATH LAB;  Service: Cardiovascular;  Laterality: N/A;   LEFT HEART CATHETERIZATION WITH CORONARY/GRAFT ANGIOGRAM N/A 12/10/2013   Procedure: LEFT HEART CATHETERIZATION WITH Estella Helling;  Surgeon: Lucendia Rusk, MD;  Location: [facility] CATH LAB;  Service: Cardiovascular;  Laterality: N/A;   OVARY SURGERY     ovarian cancer   POLYPECTOMY  03/09/2019   Procedure: POLYPECTOMY;  Surgeon: Crystle Corporal, MD;  [facility];  Service: Endoscopy;;   cecal    POLYPECTOMY N/A 09/26/2019   Procedure: DUODENAL POLYPECTOMY;  Surgeon: Ladeidra Corporal, MD;  [facility];  Service: Endoscopy;  Laterality: N/A;   POLYPECTOMY  08/11/2022   Procedure: POLYPECTOMY;  Surgeon: Nannette Babe, MD;  Location: [facility] ENDOSCOPY;  Service: Gastroenterology;;   REVISION OF ARTERIOVENOUS GORETEX GRAFT N/A 02/24/2017   Procedure: REVISION OF ARTERIOVENOUS GORETEX GRAFT (RESECTION);  Surgeon: Jackquelyn Mass, MD;  Location: [facility] ORS;  Service: Vascular;  Laterality: N/A;   REVISON OF ARTERIOVENOUS FISTULA Left 06/19/2020   Procedure: REVISION OF LEFT UPPER ARM AV GRAFT WITH INTERPOSITION JUMP GRAFT USING GORE LIMB;  Surgeon: Young Hensen, MD;  Location: [facility] OR;  Service: Vascular;  Laterality: Left;   SHUNTOGRAM N/A 10/15/2013   Procedure: Fistulogram;  Surgeon: Margherita Shell, MD;  Location: [facility] CATH LAB;  Service: Cardiovascular;  Laterality: N/A;   SUBMUCOSAL TATTOO INJECTION  11/01/2022   Procedure: SUBMUCOSAL TATTOO INJECTION;  Surgeon: Urban Garden, MD;  [facility];  Service: Gastroenterology;;   THROMBECTOMY / ARTERIOVENOUS GRAFT REVISION  2011   left upper arm   TUBAL LIGATION  1980's   UPPER EXTREMITY ANGIOGRAPHY Bilateral 12/06/2016   Procedure: Upper Extremity Angiography;  Surgeon: Jackquelyn Mass, MD;  Location: [facility] INVASIVE CV LAB;  Service: Cardiovascular;  Laterality: Bilateral;   UPPER EXTREMITY INTERVENTION Left 06/06/2017   Procedure: UPPER EXTREMITY INTERVENTION;  Surgeon: Jackquelyn Mass, MD;  Location: [facility] INVASIVE CV LAB;  Service: Cardiovascular;  Laterality: Left;   UPPER EXTREMITY VENOGRAPHY Left 10/04/2022   Procedure: UPPER EXTREMITY VENOGRAPHY;  Surgeon: Jackquelyn Mass, MD;  Location: [facility] INVASIVE CV LAB;  Service: Cardiovascular;  Laterality: Left;   Family History:   Family History  Problem Relation Age of Onset   Heart disease Mother        Heart Disease before age 50    Hyperlipidemia Mother    Hypertension Mother    Diabetes Mother    Heart attack Mother    Heart disease Father        Heart Disease before age 77   Hyperlipidemia Father    Hypertension Father    Diabetes Father    Diabetes Sister    Hypertension Sister    Diabetes Brother    Hyperlipidemia Brother    Heart attack Brother  Hypertension Sister    Heart attack Brother    Colon cancer Child 80   Other Other        noncontributory for early CAD   Esophageal cancer Neg Hx    Liver disease Neg Hx    Kidney disease Neg Hx    Colon polyps Neg Hx    Social History:  reports that she has never smoked. She has never used smokeless tobacco. She reports that she does not drink alcohol  and does not use drugs. Allergies  Allergen Reactions   Amlodipine  Swelling   Aspirin  Other (See Comments)    High Doses Mess up her stomach; "makes my bowels have blood in them". Takes 81 mg EC Aspirin     Nitrofurantoin Hives   Ranexa  [Ranolazine ] Other (See Comments)    Myoclonus-hospitalized    Bactrim [Sulfamethoxazole-Trimethoprim] Rash   Iodinated Contrast Media Itching and Other (See Comments)   Iron  Itching and Other (See Comments)    "they gave me iron  in dialysis; had to give me Benadryl  cause I had to have the iron " (05/02/2012)   Tylenol  [Acetaminophen ] Itching and Other (See Comments)    Makes her feet on fire per pt - can tolerate per pt   Gabapentin  Other (See Comments)    Unknown reaction   Sucroferric Oxyhydroxide Other (See Comments)    Unknown   Levaquin  [Levofloxacin  In D5w] Rash   Levofloxacin  Rash and Other (See Comments)   Plavix  [Clopidogrel  Bisulfate] Rash   Protonix  [Pantoprazole  Sodium] Rash   Venofer [Ferric Oxide] Itching and Other (See Comments)    Patient reports using Benadryl  prior to doses as [facility]   Prior to Admission medications   Medication Sig Start Date End Date Taking? Authorizing Provider  albuterol  (PROVENTIL ) (2.5 MG/3ML) 0.083% nebulizer  solution USE 1 VIAL IN NEBULIZER 4 TIMES DAILY AS NEEDED FOR COUGH, WHEEZING, AND SHORTNESS OF BREATH 10/20/23  Yes [provider]  albuterol  (VENTOLIN  HFA) 108 (90 Base) MCG/ACT inhaler SMARTSIG:2 Puff(s) By Mouth Every 4-6 Hours PRN 10/10/23  Yes [provider]  amiodarone  (PACERONE ) 100 MG tablet Take 1 tablet (100 mg total) by mouth daily. Or as directed by provider 11/07/23  Yes Johnson, Clanford L, MD  amiodarone  (PACERONE ) 200 MG tablet Take 2 tablets (400 mg total) by mouth 2 (two) times daily for 7 days, THEN 1 tablet (200 mg total) 2 (two) times daily for 23 days. 10/08/23 11/07/23 Yes Wynetta Heckle, MD  calcitRIOL  (ROCALTROL ) 0.25 MCG capsule Take 7 capsules (1.75 mcg total) by mouth every Monday, Wednesday, and Friday with hemodialysis. 12/21/22  Yes Lesa Rape, MD  cinacalcet  (SENSIPAR ) 30 MG tablet Take 1 tablet (30 mg total) by mouth every Monday, Wednesday, and Friday with hemodialysis. 12/19/22  Yes Lesa Rape, MD  cyanocobalamin  (VITAMIN B12) 1000 MCG/ML injection Inject 1,000 mcg into the muscle every 30 (thirty) days.   Yes [provider]  famotidine  (PEPCID ) 20 MG tablet Take 1 tablet (20 mg total) by mouth daily as needed for heartburn or indigestion. 09/10/23 09/09/24 Yes Johnson, Clanford L, MD  folic acid  (FOLVITE ) 1 MG tablet Take 1 mg by mouth daily. 05/24/22  Yes [provider]  guaifenesin  (ROBITUSSIN) 100 MG/5ML syrup Take 200 mg by mouth 3 (three) times daily as needed for cough.   Yes [provider]  hydrOXYzine  (ATARAX ) 25 MG tablet Take 25 mg by mouth every 6 (six) hours as needed for itching. 06/06/22  Yes [provider]  levETIRAcetam  (KEPPRA ) 500  MG tablet Take 1 tablet (500 mg total) by mouth daily. 11/15/22  Yes Cala Castleman, MD  levothyroxine  (SYNTHROID ) 50 MCG tablet Take 50 mcg by mouth daily. 08/27/23  Yes [provider]  metoprolol  tartrate (LOPRESSOR ) 25 MG tablet Take 0.5 tablets (12.5 mg total) by  mouth 2 (two) times daily. 10/08/23  Yes Wynetta Heckle, MD  multivitamin (RENA-VIT) TABS tablet Take 1 tablet by mouth daily.   Yes [provider]  nitroGLYCERIN  (NITROSTAT ) 0.4 MG SL tablet Place 0.4 mg under the tongue every 5 (five) minutes as needed for chest pain.   Yes [provider]  octreotide  (SANDOSTATIN  LAR) 20 MG injection Inject 20 mg into the muscle every 28 (twenty-eight) days. 11/15/22  Yes Cala Castleman, MD  ondansetron  (ZOFRAN -ODT) 4 MG disintegrating tablet Take 4 mg by mouth every 8 (eight) hours as needed for nausea or vomiting. 05/22/23  Yes [provider]  rosuvastatin  (CRESTOR ) 10 MG tablet Take 1 tablet by mouth once daily 06/06/22  Yes Strader, Brittany M, PA-C  benzonatate  (TESSALON ) 100 MG capsule Take 1 capsule (100 mg total) by mouth 2 (two) times daily as needed for cough. 11/05/23   Rayfield Cairo, MD   Current Facility-Administered Medications  Medication Dose Route Frequency Provider Last Rate Last Admin   0.9 %  sodium chloride  infusion (Manually program via Guardrails IV Fluids)   Intravenous Once Johnson, Clanford L, MD       acetaminophen  (TYLENOL ) tablet 1,000 mg  1,000 mg Oral Q6H PRN Arnulfo Larch, MD   1,000 mg at 11/05/23 2126   albuterol  (PROVENTIL ) (2.5 MG/3ML) 0.083% nebulizer solution 2.5 mg  2.5 mg Nebulization Q4H PRN Elgergawy, Dawood S, MD       alteplase  (CATHFLO ACTIVASE ) injection 2 mg  2 mg Intracatheter Once PRN Clementine Cutting, MD       amiodarone  (PACERONE ) tablet 100 mg  100 mg Oral Daily Johnson, Clanford L, MD   100 mg at 11/05/23 1107   calcitRIOL  (ROCALTROL ) capsule 1.75 mcg  1.75 mcg Oral Q M,W,F-HD Elgergawy, Dawood S, MD       Chlorhexidine  Gluconate Cloth 2 % PADS 6 each  6 each Topical Q0600 Clementine Cutting, MD   6 each at 11/06/23 0522   cinacalcet  (SENSIPAR ) tablet 30 mg  30 mg Oral Q M,W,F-HD Elgergawy, Dawood S, MD       famotidine  (PEPCID ) tablet 20 mg  20 mg Oral Daily PRN  Lincoln Renshaw, Clanford L, MD   20 mg at 11/04/23 2035   folic acid  (FOLVITE ) tablet 1 mg  1 mg Oral Daily Elgergawy, Dawood S, MD   1 mg at 11/05/23 0819   heparin  injection 1,000 Units  1,000 Units Intracatheter PRN Clementine Cutting, MD   1,000 Units at 11/04/23 1655   hydrALAZINE  (APRESOLINE ) injection 10 mg  10 mg Intravenous Q4H PRN Johnson, Clanford L, MD       hydrALAZINE  (APRESOLINE ) tablet 25 mg  25 mg Oral Q8H Johnson, Clanford L, MD   25 mg at 11/06/23 1610   hydrOXYzine  (ATARAX ) tablet 10 mg  10 mg Oral TID PRN Johnson, Clanford L, MD       isosorbide  mononitrate (IMDUR ) 24 hr tablet 30 mg  30 mg Oral Daily Theotis Flake M, PA-C       levETIRAcetam  (KEPPRA ) tablet 500 mg  500 mg Oral Daily Elgergawy, Dawood S, MD   500 mg at 11/05/23 9604   levothyroxine  (SYNTHROID ) tablet 50 mcg  50 mcg Oral Q0600 Elgergawy, Dawood S, MD   50 mcg at 11/06/23 8938   multivitamin (RENA-VIT) tablet 1 tablet  1 tablet Oral Daily Elgergawy, Ardia Kraft, MD   1 tablet at 11/05/23 1017   nitroGLYCERIN  (NITROSTAT ) SL tablet 0.4 mg  0.4 mg Sublingual Q5 min PRN Elgergawy, Ardia Kraft, MD   0.4 mg at 11/03/23 2020   rosuvastatin  (CRESTOR ) tablet 10 mg  10 mg Oral QHS Elgergawy, Ardia Kraft, MD   10 mg at 11/05/23 2126   Labs: Basic Metabolic Panel: Recent Labs  Lab 11/03/23 1305 11/04/23 0458 11/06/23 0428  NA 134* 136 137  K 3.1* 4.2 3.8  CL 97* 102 100  CO2 22 21* 22  GLUCOSE 152* 133* 123*  BUN 36* 40* 32*  CREATININE 4.29* 4.71* 4.24*  CALCIUM  7.7* 7.7* 8.0*  PHOS  --   --  3.9   Liver Function Tests: Recent Labs  Lab 11/06/23 0428  ALBUMIN  2.8*   No results for input(s): "LIPASE", "AMYLASE" in the last 168 hours. No results for input(s): "AMMONIA" in the last 168 hours. CBC: Recent Labs  Lab 11/03/23 1521 11/04/23 0458 11/05/23 0839 11/06/23 0428  WBC 5.5 4.9 4.6 4.1  NEUTROABS 3.5  --   --   --   HGB 8.3* 7.0* 10.4* 9.4*  HCT 25.8* 21.5* 31.0* 29.4*  MCV 107.1* 103.9* 99.0 103.5*   PLT 162 178 150 149*   Cardiac Enzymes: No results for input(s): "CKTOTAL", "CKMB", "CKMBINDEX", "TROPONINI" in the last 168 hours. CBG: Recent Labs  Lab 11/04/23 1141  GLUCAP 96   Iron  Studies: No results for input(s): "IRON ", "TIBC", "TRANSFERRIN", "FERRITIN" in the last 72 hours. Studies/Results: CT HEAD WO CONTRAST ( ) Result Date: 11/05/2023 CLINICAL DATA:  Mental status change.  Unknown cause. EXAM: CT HEAD WITHOUT CONTRAST TECHNIQUE: Contiguous axial images were obtained from the base of the skull through the vertex without intravenous contrast. RADIATION DOSE REDUCTION: This exam was performed according to the departmental dose-optimization program which includes automated exposure control, adjustment of the mA and/or kV according to patient size and/or use of iterative reconstruction technique. COMPARISON:  CT head without contrast 09/08/2023. MR head without contrast 10/09/2023. FINDINGS: Brain: The thin extra-axial collections or dural thickening is again noted, stable. No acute infarct, hemorrhage, or mass lesion is present. Atrophy and white matter disease is moderately advanced for age. Remote lacunar infarcts are present in the basal ganglia bilaterally. Vascular: Atherosclerotic calcifications are present within the cavernous internal carotid arteries bilaterally and at the dural margin of both vertebral arteries. Skull: Calvarium is intact. No focal lytic or blastic lesions are present. No significant extracranial soft tissue lesion is present. Sinuses/Orbits: A polyp or mucous retention cyst is again noted in the left sphenoid sinus. The paranasal sinuses and mastoid air cells are otherwise clear. IMPRESSION: 1. No acute intracranial abnormality or significant interval change. 2. Stable thin extra-axial collections or dural thickening. 3. Atrophy and white matter disease is moderately advanced for age. This likely reflects the sequela of chronic microvascular ischemia. 4. Remote  lacunar infarcts in the basal ganglia bilaterally. Electronically Signed   By: Audree Leas M.D.   On: 11/05/2023 13:22   DG CHEST PORT 1 VIEW Result Date: 11/05/2023 CLINICAL DATA:  Cough.  Altered mental status. EXAM: PORTABLE CHEST 1 VIEW COMPARISON:  11/20/2023. FINDINGS: Prominent cardiac silhouette. Pulmonary vascular congestion consistent with interstitial edema and bibasilar consolidation or volume loss. Bilateral small pleural effusions. Postop median sternotomy and CABG. Left-sided dialysis  catheter tip distal SVC at the RA/SVC junction. IMPRESSION: Findings consistent with CHF. Electronically Signed   By: Sydell Eva M.D.   On: 11/05/2023 13:15    ROS: Pertinent items are noted in HPI. Physical Exam: Vitals:   11/05/23 1040 11/05/23 1107 11/05/23 1310 11/05/23 1926  BP: (!) 185/77 (!) 185/77 (!) 173/70 (!) 178/69  Pulse:   60 61  Resp:      Temp:   97.9 F (36.6 C) 98.2 F (36.8 C)  TempSrc:   Oral Oral  SpO2: 100%  94% 99%  Weight:      Height:          Weight change:  No intake or output data in the 24 hours ending 11/06/23 0853 BP (!) 178/69 (BP Location: Right Arm)   Pulse 61   Temp 98.2 F (36.8 C) (Oral)   Resp 20   Ht 5\' 2"  (1.575 m)   Wt 55 kg   SpO2 99%   BMI 22.18 kg/m  General appearance: alert, cooperative, and no distress Head: Normocephalic, without obvious abnormality, atraumatic Resp: clear to auscultation bilaterally Cardio: regular rate and rhythm, S1, S2 normal, no murmur, click, rub or gallop GI: soft, non-tender; bowel sounds normal; no masses,  no organomegaly Extremities: extremities normal, atraumatic, no cyanosis or edema Dialysis Access: [facility]  Dialysis Orders: Center: [facility]  on MWF . EDW 51kg HD Bath 2K/2.5Ca  Time 4:00 Heparin  none. Access LIJ TDC BFR 300 DFR 500    Micera 200 mcg every 2 weeks  Assessment/Recommendations:   ESRD  -outpatient HD orders: [name].  4 hours.  EDW 51 kg.  TDC.  Nepro 17  H Elisio.  Flow rates: 300/500.  2K/2.5 calcium .  Heparin : Only heparin  locks for TDC.  Meds: Calcitriol  0.75 mcg 3 times a week, ID PN, Mircera 200 mcg every 2 weeks (due today 2/10) -HD today to get back on schedule.  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 Transfused 1 unit PRBC with hgb improved from 7 to 10.4.  Acute metabolic encephalopathy -she became acutely lethargic and confused yesterday.  CT scan without intracranial abnormality.  Discharge was held. -back to baseline this morning.  Chronic combined systolic and diastolic CHF exacerbation -UF as tolerated with HD   Volume/ hypertension  -UF as tolerated -CXR with pulmonary edema   Anemia of Chronic Kidney Disease -Hgb 7.1, due for transfusion per primary service. Resuming ESA-- Aranesp  150mcg ordered for today -Transfuse PRN for Hgb <7   Secondary Hyperparathyroidism/Hyperphosphatemia - resume home meds, calctriol resumed, monitor phos    Elevated troponin -thought to be secondary to demand ischemia secondary to sepsis. Per primary   Paroxysmal Afib -not on a/c given history of anemia/GIB -per primary  Benjamin Brands, MD [facility], [facility] Pager [phone] 11/06/2023, 8:53 AM

## 2023-11-06 NOTE — Telephone Encounter (Signed)
 Shawna [name]:  Patient needs follow-up with Dr. Sammi Crick in 2 weeks (if he has opening). If not, she was seen recently inpatient during prior admissions last by [facility].    [name]: can we have CBC as outpatient on Friday (if discharged early this week). If not discharged till later in week, we can place for Monday.

## 2023-11-06 NOTE — Plan of Care (Signed)
   Problem: Education: Goal: Knowledge of General Education information will improve Description: Including pain rating scale, medication(s)/side effects and non-pharmacologic comfort measures Outcome: Progressing   Problem: Education: Goal: Knowledge of General Education information will improve Description: Including pain rating scale, medication(s)/side effects and non-pharmacologic comfort measures Outcome: Progressing   Problem: Health Behavior/Discharge Planning: Goal: Ability to manage health-related needs will improve Outcome: Progressing

## 2023-11-06 NOTE — Progress Notes (Signed)
 Chart reviewed. GI to follow-up as outpatient. Continue octreotide  monthly. Will arrange early interval follow-up as outpatient. ]

## 2023-11-06 NOTE — TOC Initial Note (Signed)
 Transition of Care [facility]) - Initial/Assessment Note    Patient Details  Name: Shawna [name] MRN: [id number] Date of Birth: 12-Sep-1940  [facility] CM/SW Contact:    Ander Katos, LCSW Phone Number: 11/06/2023, 10:32 AM  Clinical Narrative:  Pt admitted for symptomatic anemia. Assessment completed due to high risk readmission score. Pt is well known to TOC from previous admissions. She lives with husband. Pt's dialysis is MWF at [facility]. Referral sent to [facility] over weekend. However, it has been declined as pt is out of network. Pt oriented x2 per chart. LCSW discussed with husband who is agreeable to [facility]. Referral made. [facility] is able to accept pt for HHPT/RN as long as she does not refuse visits as she has in past. LCSW discussed this with husband who was unaware that this had happened. He requests [facility] call him to schedule visits. Sarah with [facility] notified. TOC will continue to follow.                  Expected Discharge Plan: Home w Home Health Services Barriers to Discharge: Continued Medical Work up   Patient Goals and CMS Choice Patient states their goals for this hospitalization and ongoing recovery are:: return home CMS Medicare.gov Compare Post Acute Care list provided to:: Patient Choice offered to / list presented to : Spouse Ruso ownership interest in [facility].provided to::  (n/a)    Expected Discharge Plan and Services In-house Referral: Clinical Social Work   Post Acute Care Choice: Home Health Living arrangements for the past 2 months: Single Family Home Expected Discharge Date: 11/05/23                         HH Arranged: RN, PT HH Agency: [facility] Date [facility] Agency Contacted: 11/06/23 Time HH Agency Contacted: 1031 Representative spoke with at [facility] Agency: Sarah  Prior Living Arrangements/Services Living arrangements for the past 2 months: Single  Family Home Lives with:: Spouse Patient language and need for interpreter reviewed:: Yes Do you feel safe going back to the place where you live?: Yes      Need for Family Participation in Patient Care: Yes (Comment) Care giver support system in place?: Yes (comment) Current home services: DME Criminal Activity/Legal Involvement Pertinent to Current Situation/Hospitalization: No - Comment as needed  Activities of Daily Living      Permission Sought/Granted                  Emotional Assessment       Orientation: : Oriented to Self, Oriented to Place Alcohol  / Substance Use: Not Applicable Psych Involvement: No (comment)  Admission diagnosis:  Bradycardia [R00.1] Symptomatic anemia [D64.9] Gastrointestinal hemorrhage, unspecified gastrointestinal hemorrhage type [K92.2] Patient Active Problem List   Diagnosis Date Noted   DNR (do not resuscitate) 11/04/2023   Atrial fibrillation with rapid ventricular response (HCC) 10/07/2023   GI bleed 10/03/2023   Hypoxia 08/24/2023   Severe sepsis (HCC) 08/20/2023   Pleural effusion 08/20/2023   Multifocal pneumonia 08/19/2023   Symptomatic bradycardia 08/05/2023   Generalized weakness 07/12/2023   Sinus bradycardia 07/11/2023   FTT (failure to thrive) in adult 06/04/2023   Symptomatic anemia 03/08/2023   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   Acute blood loss anemia 01/27/2023   Paroxysmal atrial flutter (HCC) 12/14/2022   Pressure injury of skin 12/01/2022   Rectal bleeding 11/30/2022   Generalized abdominal pain 11/10/2022  Ileus (HCC) 11/04/2022   Periumbilical abdominal pain 11/04/2022   Macrocytic anemia 10/29/2022   Anemia of chronic disease 10/29/2022   Upper GI bleed 10/29/2022   Iron  deficiency anemia due to chronic blood loss 08/26/2022   Benign neoplasm of transverse colon 08/11/2022   Benign neoplasm of descending colon 08/11/2022   Benign neoplasm of sigmoid colon 08/11/2022   Angiodysplasia  of duodenum 08/11/2022   Delirium 04/22/2022   History of arteriovenous malformation (AVM) 04/22/2022   Acute metabolic encephalopathy 11/09/2021   NSTEMI, initial episode of care (HCC) 10/19/2021   Acute on chronic blood loss anemia 09/09/2021   Ischemic cardiomyopathy    Atrial fibrillation, chronic (HCC) 05/07/2021   Neuropathy 04/23/2021   Anemia associated with chronic renal failure 04/13/2021   Left knee pain 03/15/2021   Moderate protein-calorie malnutrition (HCC) 02/27/2021   Hypokalemia 02/27/2021   COVID-19 virus infection 02/03/2021   Leukocytosis 02/03/2021   Hypoalbuminemia due to protein-calorie malnutrition (HCC) 02/03/2021   Acquired hypothyroidism 02/03/2021   Myositis 12/03/2020   Ataxia 12/02/2020   Diabetes mellitus type 2 in nonobese (HCC) 11/10/2020   Failure to thrive in adult 11/09/2020   Dialysis AV fistula malfunction, initial encounter (HCC)    Jugular vein occlusion, right (HCC)    Failure of surgically constructed arteriovenous fistula (HCC) 10/03/2020   Myoclonus 08/31/2020   Clotted renal dialysis AV graft, initial encounter (HCC)    Hemodialysis-associated hypotension    Irritable bowel syndrome 02/25/2020   Pulmonary edema 10/14/2019   Adenomatous duodenal polyp 09/10/2019   History of GI bleed 09/10/2019   Acute respiratory failure with hypoxia (HCC) 12/25/2018   Elevated troponin 12/14/2018   Hand steal syndrome (HCC) 08/01/2017   Anemia 07/14/2017   CAD (coronary artery disease) 06/05/2017   Intestinal ischemia (HCC)    Complication of vascular access for dialysis 03/19/2017   Preoperative clearance 01/25/2017   H/O non-ST elevation myocardial infarction (NSTEMI) 10/24/2016   Non-ST elevation (NSTEMI) myocardial infarction [facility])    Heme positive stool    Cardiac arrest [facility])    Palliative care encounter    Goals of care, counseling/discussion    Flash pulmonary edema (HCC) 04/06/2016   History of colon cancer 01/27/2016   History of  ovarian cancer 01/27/2016   Paroxysmal atrial fibrillation (HCC) 10/14/2015   Malignant neoplasm of right ovary (HCC) 10/14/2015   Wide-complex tachycardia 09/08/2015   SVT (supraventricular tachycardia) (HCC) 09/08/2015   Hypertensive urgency 05/04/2015   Essential hypertension    Dyspnea    Constipation 03/12/2013   Abdominal wall pain in left flank 03/12/2013   Occlusion and stenosis of carotid artery without mention of cerebral infarction 01/24/2013   Hx of CABG 07/05/2012   Carotid artery disease (HCC) 07/05/2012   Mitral regurgitation 06/12/2012   Non-STEMI (non-ST elevated myocardial infarction) (HCC) 06/08/2012   Chronic combined systolic and diastolic CHF (congestive heart failure) (HCC) 05/02/2012   AVM (arteriovenous malformation) of small bowel, acquired with hemorrhage 01/20/2012   GERD (gastroesophageal reflux disease) 01/09/2012   Mixed hyperlipidemia 01/05/2012   Atherosclerotic heart disease of native coronary artery without angina pectoris 12/16/2011   ESRD on hemodialysis (HCC) 12/16/2011   Anxiety disorder 05/04/2011   End-stage renal disease on hemodialysis (HCC) 04/29/2011   Gout 04/29/2011   PCP:  Practice, [facility]:   [facility] [address], [place] - [address] [place] [address] Phone: [phone] Fax: [phone]  [facility] - [address] [place], [place] - [address]  [address] [place] [address] Phone: [phone] Fax: [phone]     Social Drivers of Health (SDOH) Social History: SDOH Screenings   Food Insecurity: No Food Insecurity (11/04/2023)  Housing: Low Risk  (11/04/2023)  Transportation Needs: No Transportation Needs (11/04/2023)  Utilities: At Risk (11/04/2023)  Financial Resource Strain: Low Risk  (12/14/2018)  Physical Activity: Unknown (12/14/2018)  Social Connections: Unknown (11/04/2023)  Stress: No Stress Concern Present (12/14/2018)  Tobacco Use: Low Risk  (10/09/2023)  Health Literacy:  Adequate Health Literacy (08/01/2023)   [facility] from [facility]  Recent Concern: Health Literacy - Inadequate Health Literacy (07/20/2023)   Received from [facility]   SDOH Interventions: Utilities Interventions: Inpatient TOC, Other (Comment) (Resources added to AVS)   Readmission Risk Interventions    11/06/2023   10:21 AM 09/10/2023    9:23 AM 08/20/2023    2:28 PM  Readmission Risk Prevention Plan  Transportation Screening Complete Complete Complete  Medication Review [employer]) Complete Complete Complete  PCP or Specialist appointment within 3-5 days of discharge  Complete Complete  HRI or Home Care Consult Complete Complete Complete  SW Recovery Care/Counseling Consult Complete Complete Complete  Palliative Care Screening Not Applicable Not Applicable   Skilled Nursing Facility Not Applicable Not Applicable

## 2023-11-06 NOTE — Consult Note (Addendum)
 Cardiology Consultation   Patient ID: Shawna [name] MRN: [id number]; DOB: 1941-01-18  Admit date: 11/03/2023 Date of Consult: 11/06/2023  PCP:  [facility]   [facility] Providers Cardiologist:  Armida Lander, MD        Patient Profile:   Shawna [name] is a 83 y.o. female with a hx of   who is being seen 11/06/2023 for the evaluation of  has hx of  ESRD on hemodialysis, CAD s/p CABG 2013, DES to D1 in April 2023, HFrEF LBBB diabetes, hypertension, PAF not on anticoagulation due to history of recurrent GI bleed, GERD, history of TIA , colon CA, esophageal stricture.  Cardiology asked to see due to bradycardia and CHF at the request of Dr. Lincoln Renshaw.  History of Present Illness:   Shawna [name] is well known to us . We saw her in January for significant bradycardia and amiodarone  and metoprolol  were stopped. She was readmitted 09/2023 with Afib with RVR and amiodarone  and metoprolol  both restarted. She was admitted 11/03/23 from dialysis for low Hgb 6.9 but in ED 8.3 hemoccult positive and bradycardic at 41/m. Amiodarone  and metoprolol  both stopped and recurrent Afib with RVR. Dr. Paulita Boss recommended amiodarone  100 mg daily. Yesterday she had acute altered mental state and volume overload-managed with dialysis. Uncontrolled HTN since metoprolol  stopped and unable to obtain IV access.  Patient says she was doing well until 3-4 months ago, still driving, walking without assistance etc. Lives at home with her husband. Has progressive weakness, can't walk without a cane or assistance and overall fatigue and DOE. Doesn't feel her heart racing too much. Denies chest pain. Says her breathing is pretty good today but was rough 2 days ago.   Past Medical History:  Diagnosis Date   Acute on chronic respiratory failure with hypoxia (HCC) 10/10/2016   Anxiety    Arthritis    AVM (arteriovenous malformation) of colon    CAD (coronary artery disease)    a. s/p CABG in 2013 b.  DES to D1 in 10/2016. c. cath in 07/2018 showing patent grafts with occlusion of D1 at prior stent site and progression of PDA disease --> medical management recommended   Carotid artery disease (HCC)    a. 60-79% LICA, 03/2012    Chronic bronchitis (HCC)    Chronic HFrEF (heart failure with reduced ejection fraction) (HCC)    Colon cancer (HCC) 1992   Esophageal stricture    ESRD on hemodialysis (HCC)    ESRD due to HTN, started dialysis 2011 and gets HD at [facility] with Dr Lieutenant Reese on MWF schedule.  Access is LUA AVF as of Sept 2014.    GERD (gastroesophageal reflux disease)    High cholesterol 12/2011   History of blood transfusion 07/2011; 12/2011; 01/2012 X 2; 04/2012   History of gout    History of lower GI bleeding    Hypertension    Iron  deficiency anemia    Jugular vein occlusion, right (HCC)    Mitral regurgitation    a. Moderate by echo, 02/2012   Mitral valve disease    NSVT (nonsustained ventricular tachycardia) (HCC)    Ovarian cancer (HCC) 1992   PAF (paroxysmal atrial fibrillation) (HCC)    Pneumonia ~ 2009   PUD (peptic ulcer disease)    TIA (transient ischemic attack)    Tricuspid valve disease     Past Surgical History:  Procedure Laterality Date   A/V FISTULAGRAM Left 10/04/2022   Procedure: A/V Fistulagram;  Surgeon: Prescilla Brod,  Ninette Basque, MD;  Location: [facility] INVASIVE CV LAB;  Service: Cardiovascular;  Laterality: Left;   A/V SHUNTOGRAM Left 03/19/2019   Procedure: A/V SHUNTOGRAM;  Surgeon: Jackquelyn Mass, MD;  Location: [facility] INVASIVE CV LAB;  Service: Cardiovascular;  Laterality: Left;   ABDOMINAL HYSTERECTOMY  1992   APPENDECTOMY  06/1990   AV FISTULA PLACEMENT  07/2009   left upper arm   AV FISTULA PLACEMENT Right 09/06/2016   Procedure: RIGHT FOREARM ARTERIOVENOUS (AV) GRAFT;  Surgeon: Richrd Char, MD;  [facility] OR;  Service: Vascular;  Laterality: Right;   AV FISTULA PLACEMENT N/A 02/24/2017   Procedure: INSERTION OF ARTERIOVENOUS (AV) GORE-TEX  GRAFT ARM (BRACHIAL AXILLARY);  Surgeon: Jackquelyn Mass, MD;  Location: [facility] ORS;  Service: Vascular;  Laterality: N/A;   AVGG REMOVAL Right 09/06/2016   Procedure: REMOVAL OF Right Arm ARTERIOVENOUS GORETEX GRAFT and Vein Patch angioplasty of brachial artery;  Surgeon: Dannis Dy, MD;  Location: [facility] OR;  Service: Vascular;  Laterality: Right;   BIOPSY  09/26/2019   Procedure: BIOPSY;  Surgeon: Kalisi Corporal, MD;  [facility];  Service: Endoscopy;;   BIOPSY  11/01/2022   Procedure: BIOPSY;  Surgeon: Urban Garden, MD;  [facility];  Service: Gastroenterology;;   COLON RESECTION  1992   COLON SURGERY     COLONOSCOPY N/A 03/09/2019   Procedure: COLONOSCOPY;  Surgeon: Starlyn Corporal, MD;  [facility];  Service: Endoscopy;  Laterality: N/A;   COLONOSCOPY N/A 08/11/2022   Procedure: COLONOSCOPY;  Surgeon: Nannette Babe, MD;  Location: [facility] ENDOSCOPY;  Service: Gastroenterology;  Laterality: N/A;   COLONOSCOPY WITH PROPOFOL  N/A 06/08/2021   Procedure: COLONOSCOPY WITH PROPOFOL ;  Surgeon: Urban Garden, MD;  [facility];  Service: Gastroenterology;  Laterality: N/A;  9:05 /Patient is on dialysis Mon Wed Fri   CORONARY ANGIOPLASTY WITH STENT PLACEMENT  12/15/11   "2"   CORONARY ANGIOPLASTY WITH STENT PLACEMENT  y/2013   "1; makes total of 3" (05/02/2012)   CORONARY ARTERY BYPASS GRAFT  06/13/2012   Procedure: CORONARY ARTERY BYPASS GRAFTING (CABG);  Surgeon: Norita Beauvais, MD;  Location: [facility] OR;  Service: Open Heart Surgery;  Laterality: N/A;  cabg x four;  using left internal mammary artery, and left leg greater saphenous vein harvested endoscopically   CORONARY STENT INTERVENTION N/A 10/13/2016   Procedure: Coronary Stent Intervention;  Surgeon: Millicent Ally, MD;  [facility];  Service: Cardiovascular;  Laterality: N/A;   DIALYSIS/PERMA CATHETER REMOVAL N/A 04/18/2017   Procedure: DIALYSIS/PERMA  CATHETER REMOVAL;  Surgeon: Jackquelyn Mass, MD;  Location: [facility] INVASIVE CV LAB;  Service: Cardiovascular;  Laterality: N/A;   DILATION AND CURETTAGE OF UTERUS     ENTEROSCOPY N/A 06/08/2021   Procedure: PUSH ENTEROSCOPY;  Surgeon: Urban Garden, MD;  [facility];  Service: Gastroenterology;  Laterality: N/A;   ENTEROSCOPY N/A 11/01/2022   Procedure: ENTEROSCOPY;  Surgeon: Urban Garden, MD;  [facility];  Service: Gastroenterology;  Laterality: N/A;   ENTEROSCOPY N/A 12/01/2022   Procedure: ENTEROSCOPY;  Surgeon: Ace Holder, MD;  Location: [facility] ENDOSCOPY;  Service: Gastroenterology;  Laterality: N/A;   ESOPHAGOGASTRODUODENOSCOPY  01/20/2012   Procedure: ESOPHAGOGASTRODUODENOSCOPY (EGD);  Surgeon: Asencion Blacksmith, MD,FACG;  Location: [facility] ENDOSCOPY;  Service: Endoscopy;  Laterality: N/A;   ESOPHAGOGASTRODUODENOSCOPY N/A 03/26/2013   Procedure: ESOPHAGOGASTRODUODENOSCOPY (EGD);  Surgeon: Tobin Forts, MD;  Location: [facility] ENDOSCOPY;  Service:  Endoscopy;  Laterality: N/A;   ESOPHAGOGASTRODUODENOSCOPY N/A 04/30/2015   Procedure: ESOPHAGOGASTRODUODENOSCOPY (EGD);  Surgeon: Kaydince Corporal, MD;  [facility];  Service: Endoscopy;  Laterality: N/A;  1pm - moved to 10/20 @ 1:10   ESOPHAGOGASTRODUODENOSCOPY N/A 07/29/2016   Procedure: ESOPHAGOGASTRODUODENOSCOPY (EGD);  Surgeon: Danette Duos, MD;  Location: [facility] ENDOSCOPY;  Service: Gastroenterology;  Laterality: N/A;  enteroscopy   ESOPHAGOGASTRODUODENOSCOPY N/A 09/26/2019   Procedure: ESOPHAGOGASTRODUODENOSCOPY (EGD);  Surgeon: Shirlie Corporal, MD;  [facility];  Service: Endoscopy;  Laterality: N/A;  1250   ESOPHAGOGASTRODUODENOSCOPY N/A 08/11/2022   Procedure: ESOPHAGOGASTRODUODENOSCOPY (EGD);  Surgeon: Nannette Babe, MD;  Location: [facility] ENDOSCOPY;  Service: Gastroenterology;  Laterality: N/A;   ESOPHAGOGASTRODUODENOSCOPY (EGD) WITH PROPOFOL  N/A 02/05/2021   Procedure:  ESOPHAGOGASTRODUODENOSCOPY (EGD) WITH PROPOFOL ;  Surgeon: Vinetta Greening, DO;  [facility];  Service: Endoscopy;  Laterality: N/A;   ESOPHAGOGASTRODUODENOSCOPY (EGD) WITH PROPOFOL  N/A 08/27/2022   Procedure: ESOPHAGOGASTRODUODENOSCOPY (EGD) WITH PROPOFOL ;  Surgeon: Vinetta Greening, DO;  [facility];  Service: Endoscopy;  Laterality: N/A;   GIVENS CAPSULE STUDY N/A 03/07/2019   Procedure: GIVENS CAPSULE STUDY;  Surgeon: Yexalen Corporal, MD;  [facility];  Service: Endoscopy;  Laterality: N/A;  7:30   GIVENS CAPSULE STUDY N/A 04/22/2021   Procedure: GIVENS CAPSULE STUDY;  Surgeon: Bethann Corporal, MD;  [facility];  Service: Endoscopy;  Laterality: N/A;  7:30   GIVENS CAPSULE STUDY N/A 08/27/2022   Procedure: GIVENS CAPSULE STUDY;  Surgeon: Vinetta Greening, DO;  [facility];  Service: Endoscopy;  Laterality: N/A;   HOT HEMOSTASIS N/A 08/11/2022   Procedure: HOT HEMOSTASIS (ARGON PLASMA COAGULATION/BICAP);  Surgeon: Nannette Babe, MD;  Location: [facility] ENDOSCOPY;  Service: Gastroenterology;  Laterality: N/A;   HOT HEMOSTASIS N/A 12/01/2022   Procedure: HOT HEMOSTASIS (ARGON PLASMA COAGULATION/BICAP);  Surgeon: Ace Holder, MD;  Location: [facility] ENDOSCOPY;  Service: Gastroenterology;  Laterality: N/A;   INSERTION OF DIALYSIS CATHETER N/A 10/05/2020   Procedure: ABORTED TUNNELED DIALYSIS CATHETER PLACEMENT RIGHT INTERNAL JUGULAR VEIN ;  Surgeon: Awilda Bogus, MD;  Location: [facility];  Service: General;  Laterality: N/A;   INTRAOPERATIVE TRANSESOPHAGEAL ECHOCARDIOGRAM  06/13/2012   Procedure: INTRAOPERATIVE TRANSESOPHAGEAL ECHOCARDIOGRAM;  Surgeon: Norita Beauvais, MD;  Location: [facility] OR;  Service: Open Heart Surgery;  Laterality: N/A;   IR DIALY SHUNT INTRO NEEDLE/INTRACATH INITIAL W/IMG LEFT Left 10/06/2020   IR FLUORO GUIDE CV LINE RIGHT  06/17/2020   IR FLUORO GUIDE CV LINE RIGHT  11/12/2022   IR GENERIC HISTORICAL  07/26/2016   IR  FLUORO GUIDE CV LINE RIGHT 07/26/2016 Lucinda Saber, MD [facility]   IR GENERIC HISTORICAL  07/26/2016   IR US  GUIDE VASC ACCESS RIGHT 07/26/2016 Lucinda Saber, MD [facility]   IR GENERIC HISTORICAL  08/02/2016   IR US  GUIDE VASC ACCESS RIGHT 08/02/2016 Lucinda Saber, MD [facility]   IR GENERIC HISTORICAL  08/02/2016   IR FLUORO GUIDE CV LINE RIGHT 08/02/2016 Lucinda Saber, MD [facility]   IR RADIOLOGY PERIPHERAL GUIDED IV START  03/28/2017   IR REMOVAL TUN CV CATH W/O FL  08/11/2020   IR REMOVAL TUN CV CATH W/O FL  11/15/2022   IR THROMBECTOMY AV FISTULA W/THROMBOLYSIS INC/SHUNT/IMG LEFT Left 06/17/2020   IR US  GUIDE VASC ACCESS LEFT  06/17/2020   IR US  GUIDE VASC ACCESS RIGHT  03/28/2017   IR US  GUIDE VASC ACCESS RIGHT  06/17/2020  IR US  GUIDE VASC ACCESS RIGHT  11/12/2022   LEFT HEART CATH AND CORONARY ANGIOGRAPHY N/A 09/20/2016   Procedure: Left Heart Cath and Coronary Angiography;  Surgeon: Arty Binning, MD;  Location: [facility] INVASIVE CV LAB;  Service: Cardiovascular;  Laterality: N/A;   LEFT HEART CATH AND CORS/GRAFTS ANGIOGRAPHY N/A 10/13/2016   Procedure: Left Heart Cath and Cors/Grafts Angiography;  Surgeon: Millicent Ally, MD;  [facility];  Service: Cardiovascular;  Laterality: N/A;   LEFT HEART CATH AND CORS/GRAFTS ANGIOGRAPHY N/A 07/13/2018   Procedure: LEFT HEART CATH AND CORS/GRAFTS ANGIOGRAPHY;  Surgeon: Swaziland, Peter M, MD;  Location: [facility] INVASIVE CV LAB;  Service: Cardiovascular;  Laterality: N/A;   LEFT HEART CATH AND CORS/GRAFTS ANGIOGRAPHY N/A 07/22/2021   Procedure: LEFT HEART CATH AND CORS/GRAFTS ANGIOGRAPHY;  Surgeon: Avanell Leigh, MD;  [facility] CV [facility];  Service: Cardiovascular;  Laterality: N/A;   LEFT HEART CATHETERIZATION WITH CORONARY ANGIOGRAM N/A 12/15/2011   Procedure: LEFT HEART CATHETERIZATION WITH CORONARY ANGIOGRAM;  Surgeon: Odie Benne, MD;  Location: [facility] CATH LAB;  Service: Cardiovascular;  Laterality: N/A;   LEFT HEART  CATHETERIZATION WITH CORONARY ANGIOGRAM N/A 01/10/2012   Procedure: LEFT HEART CATHETERIZATION WITH CORONARY ANGIOGRAM;  Surgeon: Peter M Swaziland, MD;  Location: [facility] CATH LAB;  Service: Cardiovascular;  Laterality: N/A;   LEFT HEART CATHETERIZATION WITH CORONARY ANGIOGRAM N/A 06/08/2012   Procedure: LEFT HEART CATHETERIZATION WITH CORONARY ANGIOGRAM;  Surgeon: Odie Benne, MD;  Location: [facility] CATH LAB;  Service: Cardiovascular;  Laterality: N/A;   LEFT HEART CATHETERIZATION WITH CORONARY/GRAFT ANGIOGRAM N/A 12/10/2013   Procedure: LEFT HEART CATHETERIZATION WITH Estella Helling;  Surgeon: Lucendia Rusk, MD;  Location: [facility] CATH LAB;  Service: Cardiovascular;  Laterality: N/A;   OVARY SURGERY     ovarian cancer   POLYPECTOMY  03/09/2019   Procedure: POLYPECTOMY;  Surgeon: Caycee Corporal, MD;  [facility];  Service: Endoscopy;;  cecal    POLYPECTOMY N/A 09/26/2019   Procedure: DUODENAL POLYPECTOMY;  Surgeon: Molly Corporal, MD;  [facility];  Service: Endoscopy;  Laterality: N/A;   POLYPECTOMY  08/11/2022   Procedure: POLYPECTOMY;  Surgeon: Nannette Babe, MD;  Location: [facility] ENDOSCOPY;  Service: Gastroenterology;;   REVISION OF ARTERIOVENOUS GORETEX GRAFT N/A 02/24/2017   Procedure: REVISION OF ARTERIOVENOUS GORETEX GRAFT (RESECTION);  Surgeon: Jackquelyn Mass, MD;  Location: [facility] ORS;  Service: Vascular;  Laterality: N/A;   REVISON OF ARTERIOVENOUS FISTULA Left 06/19/2020   Procedure: REVISION OF LEFT UPPER ARM AV GRAFT WITH INTERPOSITION JUMP GRAFT USING GORE LIMB;  Surgeon: Young Hensen, MD;  Location: [facility] OR;  Service: Vascular;  Laterality: Left;   SHUNTOGRAM N/A 10/15/2013   Procedure: Fistulogram;  Surgeon: Margherita Shell, MD;  Location: [facility] CATH LAB;  Service: Cardiovascular;  Laterality: N/A;   SUBMUCOSAL TATTOO INJECTION  11/01/2022   Procedure: SUBMUCOSAL TATTOO INJECTION;  Surgeon: Urban Garden, MD;  [facility];   Service: Gastroenterology;;   THROMBECTOMY / ARTERIOVENOUS GRAFT REVISION  2011   left upper arm   TUBAL LIGATION  1980's   UPPER EXTREMITY ANGIOGRAPHY Bilateral 12/06/2016   Procedure: Upper Extremity Angiography;  Surgeon: Jackquelyn Mass, MD;  Location: [facility] INVASIVE CV LAB;  Service: Cardiovascular;  Laterality: Bilateral;   UPPER EXTREMITY INTERVENTION Left 06/06/2017   Procedure: UPPER EXTREMITY INTERVENTION;  Surgeon: Jackquelyn Mass, MD;  Location: [facility] INVASIVE CV LAB;  Service: Cardiovascular;  Laterality: Left;  UPPER EXTREMITY VENOGRAPHY Left 10/04/2022   Procedure: UPPER EXTREMITY VENOGRAPHY;  Surgeon: Jackquelyn Mass, MD;  Location: [facility] INVASIVE CV LAB;  Service: Cardiovascular;  Laterality: Left;     Home Medications:  Prior to Admission medications   Medication Sig Start Date End Date Taking? Authorizing Provider  albuterol  (PROVENTIL ) (2.5 MG/3ML) 0.083% nebulizer solution USE 1 VIAL IN NEBULIZER 4 TIMES DAILY AS NEEDED FOR COUGH, WHEEZING, AND SHORTNESS OF BREATH 10/20/23  Yes [provider]  albuterol  (VENTOLIN  HFA) 108 (90 Base) MCG/ACT inhaler SMARTSIG:2 Puff(s) By Mouth Every 4-6 Hours PRN 10/10/23  Yes [provider]  amiodarone  (PACERONE ) 100 MG tablet Take 1 tablet (100 mg total) by mouth daily. Or as directed by provider 11/07/23  Yes Johnson, Clanford L, MD  amiodarone  (PACERONE ) 200 MG tablet Take 2 tablets (400 mg total) by mouth 2 (two) times daily for 7 days, THEN 1 tablet (200 mg total) 2 (two) times daily for 23 days. 10/08/23 11/07/23 Yes Wynetta Heckle, MD  calcitRIOL  (ROCALTROL ) 0.25 MCG capsule Take 7 capsules (1.75 mcg total) by mouth every Monday, Wednesday, and Friday with hemodialysis. 12/21/22  Yes Lesa Rape, MD  cinacalcet  (SENSIPAR ) 30 MG tablet Take 1 tablet (30 mg total) by mouth every Monday, Wednesday, and Friday with hemodialysis. 12/19/22  Yes Lesa Rape, MD  cyanocobalamin  (VITAMIN B12) 1000 MCG/ML injection Inject 1,000  mcg into the muscle every 30 (thirty) days.   Yes [provider]  famotidine  (PEPCID ) 20 MG tablet Take 1 tablet (20 mg total) by mouth daily as needed for heartburn or indigestion. 09/10/23 09/09/24 Yes Johnson, Clanford L, MD  folic acid  (FOLVITE ) 1 MG tablet Take 1 mg by mouth daily. 05/24/22  Yes [provider]  guaifenesin  (ROBITUSSIN) 100 MG/5ML syrup Take 200 mg by mouth 3 (three) times daily as needed for cough.   Yes [provider]  hydrOXYzine  (ATARAX ) 25 MG tablet Take 25 mg by mouth every 6 (six) hours as needed for itching. 06/06/22  Yes [provider]  levETIRAcetam  (KEPPRA ) 500 MG tablet Take 1 tablet (500 mg total) by mouth daily. 11/15/22  Yes Singh, Prashant K, MD  levothyroxine  (SYNTHROID ) 50 MCG tablet Take 50 mcg by mouth daily. 08/27/23  Yes [provider]  metoprolol  tartrate (LOPRESSOR ) 25 MG tablet Take 0.5 tablets (12.5 mg total) by mouth 2 (two) times daily. 10/08/23  Yes Wynetta Heckle, MD  multivitamin (RENA-VIT) TABS tablet Take 1 tablet by mouth daily.   Yes [provider]  nitroGLYCERIN  (NITROSTAT ) 0.4 MG SL tablet Place 0.4 mg under the tongue every 5 (five) minutes as needed for chest pain.   Yes [provider]  octreotide  (SANDOSTATIN  LAR) 20 MG injection Inject 20 mg into the muscle every 28 (twenty-eight) days. 11/15/22  Yes Cala Castleman, MD  ondansetron  (ZOFRAN -ODT) 4 MG disintegrating tablet Take 4 mg by mouth every 8 (eight) hours as needed for nausea or vomiting. 05/22/23  Yes [provider]  rosuvastatin  (CRESTOR ) 10 MG tablet Take 1 tablet by mouth once daily 06/06/22  Yes Strader, Brittany M, PA-C  benzonatate  (TESSALON ) 100 MG capsule Take 1 capsule (100 mg total) by mouth 2 (two) times daily as needed for cough. 11/05/23   Rayfield Cairo, MD    Inpatient Medications: Scheduled Meds:  sodium chloride    Intravenous Once   amiodarone   100 mg Oral Daily   calcitRIOL   1.75 mcg  Oral Q M,W,F-HD   Chlorhexidine  Gluconate Cloth  6 each  Topical Q0600   cinacalcet   30 mg Oral Q M,W,F-HD   folic acid   1 mg Oral Daily   hydrALAZINE   25 mg Oral Q8H   levETIRAcetam   500 mg Oral Daily   levothyroxine   50 mcg Oral Q0600   multivitamin  1 tablet Oral Daily   rosuvastatin   10 mg Oral QHS   Continuous Infusions:  PRN Meds: acetaminophen , albuterol , alteplase , famotidine , heparin , hydrALAZINE , nitroGLYCERIN   Allergies:    Allergies  Allergen Reactions   Amlodipine  Swelling   Aspirin  Other (See Comments)    High Doses Mess up her stomach; "makes my bowels have blood in them". Takes 81 mg EC Aspirin     Nitrofurantoin Hives   Ranexa  [Ranolazine ] Other (See Comments)    Myoclonus-hospitalized    Bactrim [Sulfamethoxazole-Trimethoprim] Rash   Iodinated Contrast Media Itching and Other (See Comments)   Iron  Itching and Other (See Comments)    "they gave me iron  in dialysis; had to give me Benadryl  cause I had to have the iron " (05/02/2012)   Tylenol  [Acetaminophen ] Itching and Other (See Comments)    Makes her feet on fire per pt - can tolerate per pt   Gabapentin  Other (See Comments)    Unknown reaction   Sucroferric Oxyhydroxide Other (See Comments)    Unknown   Levaquin  [Levofloxacin  In D5w] Rash   Levofloxacin  Rash and Other (See Comments)   Plavix  [Clopidogrel  Bisulfate] Rash   Protonix  [Pantoprazole  Sodium] Rash   Venofer [Ferric Oxide] Itching and Other (See Comments)    Patient reports using Benadryl  prior to doses as [facility]    Social History:   Social History   Socioeconomic History   Marital status: Married    Spouse name: Leoda Sorce   Number of children: 1   Years of education: Not on file   Highest education level: GED or equivalent  Occupational History   Occupation: retired- Architectural technologist- sewed  Tobacco Use   Smoking status: Never   Smokeless tobacco: Never  Vaping Use   Vaping status: Never Used  Substance and  Sexual Activity   Alcohol  use: No    Alcohol /week: 0.0 standard drinks of alcohol    Drug use: No   Sexual activity: Not Currently    Birth control/protection: Surgical  Other Topics Concern   Not on file  Social History Narrative   Lives in [place], [place] with husband.  Dialysis pt - mwf.   Social Drivers of Corporate investment banker Strain: Low Risk  (12/14/2018)   Overall Financial Resource Strain (CARDIA)    Difficulty of Paying Living Expenses: Not very hard  Food Insecurity: No Food Insecurity (11/04/2023)   Hunger Vital Sign    Worried About Running Out of Food in the Last Year: Never true    Ran Out of Food in the Last Year: Never true  Transportation Needs: No Transportation Needs (11/04/2023)   PRAPARE - Administrator, Civil Service (Medical): No    Lack of Transportation (Non-Medical): No  Physical Activity: Unknown (12/14/2018)   Exercise Vital Sign    Days of Exercise per Week: Patient declined    Minutes of Exercise per Session: Patient declined  Stress: No Stress Concern Present (12/14/2018)   [employer] of Occupational Health - Occupational Stress Questionnaire    Feeling of Stress : Only a little  Social Connections: Unknown (11/04/2023)   Social Connection and Isolation Panel [NHANES]    Frequency of Communication with Friends and Family: Patient unable  to answer    Frequency of Social Gatherings with Friends and Family: Patient unable to answer    Attends Religious Services: Patient declined    Active Member of Clubs or Organizations: Patient declined    Attends Banker Meetings: Patient declined    Marital Status: Living with partner  Intimate Partner Violence: Not At Risk (11/04/2023)   Humiliation, Afraid, Rape, and Kick questionnaire    Fear of Current or Ex-Partner: No    Emotionally Abused: No    Physically Abused: No    Sexually Abused: No    Family History:     Family History  Problem Relation Age of Onset   Heart disease  Mother        Heart Disease before age 32   Hyperlipidemia Mother    Hypertension Mother    Diabetes Mother    Heart attack Mother    Heart disease Father        Heart Disease before age 74   Hyperlipidemia Father    Hypertension Father    Diabetes Father    Diabetes Sister    Hypertension Sister    Diabetes Brother    Hyperlipidemia Brother    Heart attack Brother    Hypertension Sister    Heart attack Brother    Colon cancer Child 60   Other Other        noncontributory for early CAD   Esophageal cancer Neg Hx    Liver disease Neg Hx    Kidney disease Neg Hx    Colon polyps Neg Hx      ROS:  Please see the history of present illness.  Review of Systems  Constitutional: Positive for malaise/fatigue.  HENT: Negative.    Eyes: Negative.   Cardiovascular:  Positive for dyspnea on exertion and irregular heartbeat.  Respiratory: Negative.    Hematologic/Lymphatic: Negative.   Musculoskeletal: Negative.  Negative for joint pain.  Gastrointestinal:  Positive for melena.  Genitourinary: Negative.   Neurological:  Positive for loss of balance and weakness.    All other ROS reviewed and negative.     Physical Exam/Data:   Vitals:   11/05/23 1040 11/05/23 1107 11/05/23 1310 11/05/23 1926  BP: (!) 185/77 (!) 185/77 (!) 173/70 (!) 178/69  Pulse:   60 61  Resp:      Temp:   97.9 F (36.6 C) 98.2 F (36.8 C)  TempSrc:   Oral Oral  SpO2: 100%  94% 99%  Weight:      Height:       No intake or output data in the 24 hours ending 11/06/23 0836    11/04/2023    5:01 PM 11/04/2023    1:05 PM 11/03/2023    7:17 PM  Last 3 Weights  Weight (lbs) 121 lb 4.1 oz 126 lb 12.2 oz 119 lb 11.4 oz  Weight (kg) 55 kg 57.5 kg 54.3 kg     Body mass index is 22.18 kg/m.  General:  thin, elderly, in no acute distress  HEENT: normal Neck: slight JVD Vascular: No carotid bruits; Distal pulses 2+ bilaterally Cardiac:  normal S1, S2; RRR; 2/6 systolic murmur apex Lungs:  bibasilar  crackles Abd: soft, nontender, no hepatomegaly  Ext: no edema Musculoskeletal:  No deformities, BUE and BLE strength normal and equal Skin: warm and dry  Neuro:  CNs 2-12 intact, no focal abnormalities noted Psych:  Normal affect   EKG:  The EKG was personally reviewed and demonstrates:  sinus bradycardia 40/m  LBBB Telemetry:  Telemetry was personally reviewed and demonstrates:  sinus bradycardia in 50's past 2 days.  Relevant CV Studies: Echo 08/2023 IMPRESSIONS     1. Left ventricular ejection fraction, by estimation, is 45 to 50%. The  left ventricle has mildly decreased function. The left ventricle  demonstrates regional wall motion abnormalities (see scoring  diagram/findings for description). Left ventricular  diastolic parameters are consistent with Grade II diastolic dysfunction  (pseudonormalization). Elevated left atrial pressure.   2. Right ventricular systolic function is moderately reduced. The right  ventricular size is normal. There is moderately elevated pulmonary artery  systolic pressure.   3. Left atrial size was moderately dilated.   4. The mitral valve is normal in structure. Mild mitral valve  regurgitation. No evidence of mitral stenosis.   5. The aortic valve is tricuspid. There is moderate calcification of the  aortic valve. Aortic valve regurgitation is not visualized. No aortic  stenosis is present.   6. The inferior vena cava is normal in size with greater than 50%  respiratory variability, suggesting right atrial pressure of 3 mmHg.   Comparison(s): Changes from prior study are noted. LVEF improved from  30-35% in 2023 to 45-50% now.   FINDINGS   Left Ventricle: Left ventricular ejection fraction, by estimation, is 45  to 50%. The left ventricle has mildly decreased function. The left  ventricle demonstrates regional wall motion abnormalities. The left  ventricular internal cavity size was normal  in size. There is no left ventricular hypertrophy.  Left ventricular  diastolic parameters are consistent with Grade II diastolic dysfunction  (pseudonormalization). Elevated left atrial pressure.     LV Wall Scoring:  The anterior septum is akinetic. The antero-lateral wall, inferior septum,  and apical lateral segment are hypokinetic. The entire anterior wall,  entire  inferior wall, posterior wall, and apex are normal.   Right Ventricle: The right ventricular size is normal. No increase in  right ventricular wall thickness. Right ventricular systolic function is  moderately reduced. There is moderately elevated pulmonary artery systolic  pressure. The tricuspid regurgitant  velocity is 3.28 m/s, and with an assumed right atrial pressure of 15  mmHg, the estimated right ventricular systolic pressure is 58.0 mmHg.   Left Atrium: Left atrial size was moderately dilated.   Right Atrium: Right atrial size was normal in size.   Pericardium: There is no evidence of pericardial effusion.   Mitral Valve: The mitral valve is normal in structure. Mild mitral valve  regurgitation. No evidence of mitral valve stenosis.   Tricuspid Valve: The tricuspid valve is normal in structure. Tricuspid  valve regurgitation is mild . No evidence of tricuspid stenosis.   Aortic Valve: The aortic valve is tricuspid. There is moderate  calcification of the aortic valve. Aortic valve regurgitation is not  visualized. No aortic stenosis is present.   Pulmonic Valve: The pulmonic valve was not well visualized. Pulmonic valve  regurgitation is trivial. No evidence of pulmonic stenosis.   Aorta: The aortic root is normal in size and structure.   Venous: The inferior vena cava is normal in size with greater than 50%  respiratory variability, suggesting right atrial pressure of 3 mmHg.   IAS/Shunts: No atrial level shunt detected by color flow Doppler.      Laboratory Data:  High Sensitivity Troponin:  No results for input(s): "TROPONINIHS" in the last  720 hours.   Chemistry Recent Labs  Lab 11/03/23 1305 11/04/23 0458 11/06/23 0428  NA 134* 136 137  K 3.1* 4.2 3.8  CL 97* 102 100  CO2 22 21* 22  GLUCOSE 152* 133* 123*  BUN 36* 40* 32*  CREATININE 4.29* 4.71* 4.24*  CALCIUM  7.7* 7.7* 8.0*  GFRNONAA 10* 9* 10*  ANIONGAP 15 13 15     Recent Labs  Lab 11/06/23 0428  ALBUMIN  2.8*   Lipids No results for input(s): "CHOL", "TRIG", "HDL", "LABVLDL", "LDLCALC", "CHOLHDL" in the last 168 hours.  Hematology Recent Labs  Lab 11/04/23 0458 11/05/23 0839 11/06/23 0428  WBC 4.9 4.6 4.1  RBC 2.07* 3.13* 2.84*  HGB 7.0* 10.4* 9.4*  HCT 21.5* 31.0* 29.4*  MCV 103.9* 99.0 103.5*  MCH 33.8 33.2 33.1  MCHC 32.6 33.5 32.0  RDW 28.5* 27.5* 27.2*  PLT 178 150 149*   Thyroid  No results for input(s): "TSH", "FREET4" in the last 168 hours.  BNPNo results for input(s): "BNP", "PROBNP" in the last 168 hours.  DDimer No results for input(s): "DDIMER" in the last 168 hours.   Radiology/Studies:  CT HEAD WO CONTRAST ( ) Result Date: 11/05/2023 CLINICAL DATA:  Mental status change.  Unknown cause. EXAM: CT HEAD WITHOUT CONTRAST TECHNIQUE: Contiguous axial images were obtained from the base of the skull through the vertex without intravenous contrast. RADIATION DOSE REDUCTION: This exam was performed according to the departmental dose-optimization program which includes automated exposure control, adjustment of the mA and/or kV according to patient size and/or use of iterative reconstruction technique. COMPARISON:  CT head without contrast 09/08/2023. MR head without contrast 10/09/2023. FINDINGS: Brain: The thin extra-axial collections or dural thickening is again noted, stable. No acute infarct, hemorrhage, or mass lesion is present. Atrophy and white matter disease is moderately advanced for age. Remote lacunar infarcts are present in the basal ganglia bilaterally. Vascular: Atherosclerotic calcifications are present within the cavernous internal  carotid arteries bilaterally and at the dural margin of both vertebral arteries. Skull: Calvarium is intact. No focal lytic or blastic lesions are present. No significant extracranial soft tissue lesion is present. Sinuses/Orbits: A polyp or mucous retention cyst is again noted in the left sphenoid sinus. The paranasal sinuses and mastoid air cells are otherwise clear. IMPRESSION: 1. No acute intracranial abnormality or significant interval change. 2. Stable thin extra-axial collections or dural thickening. 3. Atrophy and white matter disease is moderately advanced for age. This likely reflects the sequela of chronic microvascular ischemia. 4. Remote lacunar infarcts in the basal ganglia bilaterally. Electronically Signed   By: Audree Leas M.D.   On: 11/05/2023 13:22   DG CHEST PORT 1 VIEW Result Date: 11/05/2023 CLINICAL DATA:  Cough.  Altered mental status. EXAM: PORTABLE CHEST 1 VIEW COMPARISON:  11/20/2023. FINDINGS: Prominent cardiac silhouette. Pulmonary vascular congestion consistent with interstitial edema and bibasilar consolidation or volume loss. Bilateral small pleural effusions. Postop median sternotomy and CABG. Left-sided dialysis catheter tip distal SVC at the RA/SVC junction. IMPRESSION: Findings consistent with CHF. Electronically Signed   By: Sydell Eva M.D.   On: 11/05/2023 13:15     Assessment and Plan:   SSS with bradycardia/Afib with RVR not on anticoag due to recurrent GI bleeds, now on Amio 100 mg daily no metoprolol . HR stable past 2 days. Hopefully she'll tolerate low dose Amio. Not sure she'd be a candidate for pacemaker at this point but will continue to monitor.  Chronic HFrEF improved 40-45% on echo 08/2023 managed with HD-for HD today  HTN uncontrolled since metoprolol  stopped and no IV access, currently on hydralazine  25 mg q8. Will add imdur  30  mg daily (amlodipine  listed as an allergy-swelling)   Valvular heart disease with mod MR, mild to mod  MS  ESRD on HD  CAD s/p CABG 2013, last cath 2023 patent grafts, no recent angina  Recurrent GI bleeds   Risk Assessment/Risk Scores:        New [place]  Heart Association (NYHA) Functional Class NYHA Class IV  CHA2DS2-VASc Score = 9   This indicates a 12.2% annual risk of stroke. The patient's score is based upon: CHF History: 1 HTN History: 1 Diabetes History: 1 Stroke History: 2 Vascular Disease History: 1 Age Score: 2 Gender Score: 1         For questions or updates, please contact [facility] Please consult [email or web address] for contact info under    Signed, Theotis Flake, PA-C  11/06/2023 8:36 AM    Attending Note   Patient seen and discussed with PA Havery Lions, I agree with her documentation. 83 yo female history of CAD CABG in 2013, DES to D1 in 10/2016, cath in 07/2021 showing patent grafts  chronic HFmrEF, HTN, HLD, PAF/aflutter not on anticoag due to prior GI beleds, atach, NSVT, chronic anemia, ESRD, admitted 10/2523 after outpatient labs showed Hgb of 6.9.    K 3.1 BUN 36 BUN 36 Cr 4.29 Hgb 8.3 Plt 162  EKG sinus brady 40 with chronic LBBB CXR: suggestive of edema   08/2023 echo: LVEF 45-50%, grade II dd, mod RV dysfunction  Jan 2023 cath: mid to distal LM 60%, prox LAD 90%, D1 100%, ramus 80%, LCX 99%, OM2 60%, RCA occluded. LIMA-LAD patent, SVG-ramus patent, SVG-OM2 patent , SVG-RCA patent    1.Afib/aflutter/atach - issues with bradycardia during Jan 2025 admission - lopressor  25mg  bid and amio 100mg  daily  were stoppeed during that admission.  -lopressor  and amio restartred during 09/2023 admission. SHe was discharged on amio 400mg  bid x 7 days, then 200mg  bid x 3 weeks and metoprolol  12.5mg  bid - issues with bradycardia this admission.   - home lopressor  stopped, oral amio lowered to 100mg  daily.  - currently SR in the 60s, continue amio at 100mg . Do not restart her lopressor    2.Anemia - long history of recurrent GI bleeds, anemia  requiring transfusions - recurrent issues with bleeding GI AVMs   3. Chronic HFmrEF - fluid management per HD - medical therapy limited by low bp's in the past particularly during HD  No additional cardiology recs at this time, we will follow telemetry tomorrow if she is still admitted.    Armida Lander MD

## 2023-11-07 DIAGNOSIS — I4892 Unspecified atrial flutter: Secondary | ICD-10-CM

## 2023-11-07 DIAGNOSIS — I482 Chronic atrial fibrillation, unspecified: Secondary | ICD-10-CM | POA: Diagnosis not present

## 2023-11-07 DIAGNOSIS — K922 Gastrointestinal hemorrhage, unspecified: Secondary | ICD-10-CM | POA: Diagnosis not present

## 2023-11-07 DIAGNOSIS — R627 Adult failure to thrive: Secondary | ICD-10-CM | POA: Diagnosis not present

## 2023-11-07 DIAGNOSIS — Z66 Do not resuscitate: Secondary | ICD-10-CM | POA: Diagnosis not present

## 2023-11-07 DIAGNOSIS — D649 Anemia, unspecified: Secondary | ICD-10-CM | POA: Diagnosis not present

## 2023-11-07 LAB — RENAL FUNCTION PANEL
Albumin: 2.7 g/dL — ABNORMAL LOW (ref 3.5–5.0)
Anion gap: 11 (ref 5–15)
BUN: 20 mg/dL (ref 8–23)
CO2: 23 mmol/L (ref 22–32)
Calcium: 8.2 mg/dL — ABNORMAL LOW (ref 8.9–10.3)
Chloride: 99 mmol/L (ref 98–111)
Creatinine, Ser: 3.27 mg/dL — ABNORMAL HIGH (ref 0.44–1.00)
GFR, Estimated: 13 mL/min — ABNORMAL LOW (ref >60)
Glucose, Bld: 98 mg/dL (ref 70–99)
Phosphorus: 3.2 mg/dL (ref 2.5–4.6)
Potassium: 3.6 mmol/L (ref 3.5–5.1)
Sodium: 133 mmol/L — ABNORMAL LOW (ref 135–145)

## 2023-11-07 LAB — GLUCOSE, CAPILLARY
Glucose-Capillary: 141 mg/dL — ABNORMAL HIGH (ref 70–99)
Glucose-Capillary: 148 mg/dL — ABNORMAL HIGH (ref 70–99)
Glucose-Capillary: 97 mg/dL (ref 70–99)

## 2023-11-07 LAB — HEMOGLOBIN AND HEMATOCRIT, BLOOD
HCT: 26.6 % — ABNORMAL LOW (ref 36.0–46.0)
Hemoglobin: 8.9 g/dL — ABNORMAL LOW (ref 12.0–15.0)

## 2023-11-07 MED ORDER — CHLORHEXIDINE GLUCONATE CLOTH 2 % EX PADS
6.0000 | MEDICATED_PAD | Freq: Every day | CUTANEOUS | Status: DC
  Administered 2023-11-08 – 2023-11-09 (×2): 6 via TOPICAL

## 2023-11-07 NOTE — Telephone Encounter (Signed)
Patient still inpatient.

## 2023-11-07 NOTE — Progress Notes (Signed)
 [facility] Progress Note  Name: Shawna [name] MRN: [id number] DOB: 1941/04/12  Chief Complaint:  Low hemoglobin, dizziness, fatigue  Subjective:  Last HD on 4/28 with 2 kg UF.  She just got a bath.  Her tech states that she was more talkative earlier today - she offers limited responses to questions.     Review of systems:  Denies shortness of breath; did have chest pain this AM per tech.  She offers limited history Denies n/v   -----------------  Background on consult:  Shawna [name] is a 83 y.o. female with PMH significant for ESRD (on HD MWF at [facility]), CAD s/p CABG, HFrEF, LBBB, DM type 2, HTN, PAF not on anticoagulation due to recurrent GI bleeds from small bowel AVMs, h/o TIA, colon cancer, and GERD who was presented to [name] ED after being told by her HD unit that her hgb was low at 6.9. She did c/o increased fatigue and dizziness with dark stools. In the ED, she was afebrile and stable vital signs but HR was 42.  Stool was guaiac +.  Labs were notable for K 3.1, gluc 152, Ca 7.7, Hgb 8.3.  She was admitted and we were  consulted to provide HD during her hospitalization.  She did get HD on 11/04/23.     Intake/Output Summary (Last 24 hours) at 11/07/2023 0941 Last data filed at 11/06/2023 1358 Gross per 24 hour  Intake --  Output 2000 ml  Net -2000 ml    Vitals:  Vitals:   11/06/23 2037 11/07/23 0459 11/07/23 0800 11/07/23 0859  BP: (!) 166/57 (!) 172/71  (!) 151/61  Pulse: 63 74  71  Resp:   (!) 21   Temp: 97.9 F (36.6 C) 98.3 F (36.8 C)    TempSrc: Oral Oral    SpO2: 100% 94%  96%  Weight:      Height:         Physical Exam:   General elderly female in bed in no acute distress HEENT normocephalic atraumatic extraocular movements intact sclera anicteric Neck supple trachea midline Lungs clear to auscultation bilaterally normal work of breathing at rest  Heart S1S2 no rub Abdomen soft nontender nondistended Extremities no edema  Psych -  smiles at a joke; limited speech  Neuro - oriented to person; year is "2024" then guessed 2025. Location is "somewhere" Left internal jugular tunneled dialysis catheter; Left arm access without bruit; band in place   Medications reviewed   Labs:     Latest Ref Rng & Units 11/06/2023    4:28 AM 11/04/2023    4:58 AM 11/03/2023    1:05 PM  BMP  Glucose 70 - 99 mg/dL 829  562  130   BUN 8 - 23 mg/dL 32  40  36   Creatinine 0.44 - 1.00 mg/dL 8.65  7.84  6.96   Sodium 135 - 145 mmol/L 137  136  134   Potassium 3.5 - 5.1 mmol/L 3.8  4.2  3.1   Chloride 98 - 111 mmol/L 100  102  97   CO2 22 - 32 mmol/L 22  21  22    Calcium  8.9 - 10.3 mg/dL 8.0  7.7  7.7    Dialysis Orders:   Center: [facility]  on MWF  EDW 53.5 kg (but losing weight per HD RN) HD Bath 2K/2.5Ca  Time 4:00 Heparin  none. Access LIJ TDC BFR 300 DFR 500    Micera 200 mcg every 2 weeks -  last given on 4/21 Meds: Calcitriol  1.5 mcg 3 times a week, sensipar  30 mg three times a week   Last post weight: on last wed 54.1 kg    Assessment/Plan:   ESRD  - HD per MWF schedule - I called her outpatient HD unit to get updated dialysis orders  - renal panel today and in AM    Symptomatic anemia from acute blood loss - GI bleed  -acute on chronic blood loss anemia due to small bowel AVM's. - PRBC's per primary team    Acute metabolic encephalopathy -she became acutely lethargic and confused and so had a CT scan without intracranial abnormality.  Discharge was held per charting. - she is less interactive than she was this morning per tech - getting renal panel today   Chronic combined systolic and diastolic CHF exacerbation -UF as tolerated with HD to optimize volume status    Volume/ hypertension  -UF as tolerated with HD -CXR initially with pulmonary edema   Anemia of Chronic Kidney Disease -Transfusions per primary team  - on ESA - she recently got mircera 200 mcg on 4/21 so not yet due for redose   Secondary  Hyperparathyroidism/Hyperphosphatemia - on calcitriol  and sensipar  - no binders on med list and phos acceptable    Elevated troponin -thought to be secondary to demand ischemia secondary to sepsis. Per primary team   Paroxysmal Afib -not on a/c given history of anemia/GIB -per primary team   Disposition - per primary team   Nan Aver, MD 11/07/2023 10:06 AM

## 2023-11-07 NOTE — Progress Notes (Signed)
 PROGRESS NOTE   Shawna [name]  WUJ:[id number] DOB: 01/18/1941 DOA: 11/03/2023 PCP: [facility]   Chief Complaint  Patient presents with   low hemoglobin   Level of care: Telemetry  Brief Admission History:  83 y.o. female,  with a history of AVMs with recurrent GI bleeding, anemia, HFrEF, CAD s/p CABG on aspirin  81mg , PAF, ESRD on HD MWF, T2DM, and colon CA s/p resection 1992 patient well-known to our service with multiple presentation, hospitalization, has known history of acute on chronic blood loss anemia given history of AVMs requiring multiple admissions for transfusions.   - Presents to ED as instructed by her dialysis center for low hemoglobin as she was told at 6.9, she does report increased fatigue, dizziness, weakness, and dark stools, denies nausea, vomiting or abdominal pain, she is not on any aspirin  or anticoagulation, during recent hospitalization she was discharged on metoprolol  and amiodarone .  - In ED labs were significant for stable hemoglobin at 8.3, but she was Hemoccult positive, even though her stool color with no melena, as well she was noted bradycardic heart rate at 41, so triage hospitalist consulted to admit.   Assessment and Plan:  Acute metabolic encephalopathy - Improving - Pt acutely became lethargic, confused at bedside shortly after she was seen to be alert and conversant - pt was sent for STAT CT head; no acute findings - vitals revealed BP up to 185/77 - STAT CXR -- findings of pulmonary edema - HD planned today 4/28  - DISCHARGE HELD on 4/27  - HD planned for today 4/28 - plan for HD on schedule 4/28 with our inpatient nephrology team - she is extremely frail and hopefully will return to baseline with next HD treatment   Symptomatic anemia Acute on chronic blood loss anemia Anemia of chronic kidney disease History of AVM Hemoccult positive stool - Patient with known history of anemia, history of GI bleed due to small bowel AVM,  previous recommendation has been made for supportive transfusion and long acting somatostatin which she has been receiving. - Hemoglobin 7.0, transfused 1 unit PRBC with HD, Hg trending down daily, now 8.9 - discussed with GI, no plans for further procedures this admission - she is to take her monthly long acting octreotide  20 mg injection when she gets home, our pharmacy does not have the medication in hospital.   Severe symptomatic Sinus bradycardia History of paroxysmal A-fib with RVR -Bradycardia is most likely in the recent of recently resumed amiodarone  and metoprolol  -pt does not seem to be able to tolerate amiodarone  and metoprolol  given recurrent hospitalizations, hr is in the 40s, initially we held amiodarone  and metoprolol  - ED discussed with cardiologist Dr. Paulita Boss who recommended to reduce amiodarone  to 100 mg and DC metoprolol     - Not on anticoagulation secondary to recurrent GI bleed - when we discontinued both the amio and metoprolol  she returned with PAF with RVR  - for now we have resumed amiodarone  100 mg daily   Uncontrolled HTN - we had to DC the metoprolol  due to severe symptomatic bradycardia - now we are dealing with very high BPs - we have started hydralazine  25 mg TID and cardiology team added imdur  30 mg daily  - pt currently does not have IV access and staff unable to obtain IV after multiple attempts   Hypokalemia - She is ESRD patient, received low-dose replacement in ED    ESRD:  - Continue routine HD MWF per nephrology, she missed hemodialysis yesterday -  HD today ordered per Dr. Edson Graces   Hypothyroidism: - Continue with Synthroid    Chronic combined HFrEF, CAD, HTN, HLD: - hx CABG, no chest pain. Last echo Feb 2025 w/LVEF 45-50%, G2DD.  - Volume management with HD. - Continue with statin - Hold beta-blocker due to bradycardia, not a candidate for any antiplatelet therapy due to recurrent GI bleed - Not a candidate for any GDMT due to low  blood pressure   DVT prophylaxis: SCDs Code Status: DNR Family Communication: husband Disposition: DC home when feeling better back to baseline   Consultants:  Nephrology GI Procedures:  Hemodialysis 11/04/23   Antimicrobials:    Subjective: Pt says she feels ill today, having occasional chest pain, no SOB, she is not really happy with the food service.   Objective: Vitals:   11/06/23 2037 11/07/23 0459 11/07/23 0800 11/07/23 0859  BP: (!) 166/57 (!) 172/71  (!) 151/61  Pulse: 63 74  71  Resp:   (!) 21   Temp: 97.9 F (36.6 C) 98.3 F (36.8 C)    TempSrc: Oral Oral    SpO2: 100% 94%  96%  Weight:      Height:        Intake/Output Summary (Last 24 hours) at 11/07/2023 1221 Last data filed at 11/07/2023 0900 Gross per 24 hour  Intake 240 ml  Output 2000 ml  Net -1760 ml   Filed Weights   11/04/23 1701 11/06/23 1035 11/06/23 1358  Weight: 55 kg 52.4 kg 50.4 kg   Examination:  General exam: frail, elderly female, awake, intermittently lethargic; cooperative, intermittently confused, Appears calm and comfortable  Respiratory system: crackles heard at bases. No increased work of breathing seen. Sitting up in bed. Cardiovascular system: normal S1 & S2 heard. No JVD, murmurs, rubs, gallops or clicks. No pedal edema. Gastrointestinal system: Abdomen is nondistended, soft and nontender. No organomegaly or masses felt. Normal bowel sounds heard. Central nervous system: Alert and oriented. No focal neurological deficits. Extremities: Symmetric 5 x 5 power. Skin: No rashes, lesions or ulcers. Psychiatry: Judgement and insight appear diminshed. Mood & affect appropriate.   Data Reviewed: I have personally reviewed following labs and imaging studies  CBC: Recent Labs  Lab 11/03/23 1521 11/04/23 0458 11/05/23 0839 11/06/23 0428 11/07/23 0438  WBC 5.5 4.9 4.6 4.1  --   NEUTROABS 3.5  --   --   --   --   HGB 8.3* 7.0* 10.4* 9.4* 8.9*  HCT 25.8* 21.5* 31.0* 29.4* 26.6*   MCV 107.1* 103.9* 99.0 103.5*  --   PLT 162 178 150 149*  --     Basic Metabolic Panel: Recent Labs  Lab 11/03/23 1305 11/04/23 0458 11/06/23 0428 11/07/23 0438  NA 134* 136 137 133*  K 3.1* 4.2 3.8 3.6  CL 97* 102 100 99  CO2 22 21* 22 23  GLUCOSE 152* 133* 123* 98  BUN 36* 40* 32* 20  CREATININE 4.29* 4.71* 4.24* 3.27*  CALCIUM  7.7* 7.7* 8.0* 8.2*  PHOS  --   --  3.9 3.2    CBG: Recent Labs  Lab 11/04/23 1141 11/07/23 1116  GLUCAP 96 141*    No results found for this or any previous visit (from the past 240 hours).   Radiology Studies: CT HEAD WO CONTRAST ( ) Result Date: 11/05/2023 CLINICAL DATA:  Mental status change.  Unknown cause. EXAM: CT HEAD WITHOUT CONTRAST TECHNIQUE: Contiguous axial images were obtained from the base of the skull through the vertex without intravenous contrast. RADIATION  DOSE REDUCTION: This exam was performed according to the departmental dose-optimization program which includes automated exposure control, adjustment of the mA and/or kV according to patient size and/or use of iterative reconstruction technique. COMPARISON:  CT head without contrast 09/08/2023. MR head without contrast 10/09/2023. FINDINGS: Brain: The thin extra-axial collections or dural thickening is again noted, stable. No acute infarct, hemorrhage, or mass lesion is present. Atrophy and white matter disease is moderately advanced for age. Remote lacunar infarcts are present in the basal ganglia bilaterally. Vascular: Atherosclerotic calcifications are present within the cavernous internal carotid arteries bilaterally and at the dural margin of both vertebral arteries. Skull: Calvarium is intact. No focal lytic or blastic lesions are present. No significant extracranial soft tissue lesion is present. Sinuses/Orbits: A polyp or mucous retention cyst is again noted in the left sphenoid sinus. The paranasal sinuses and mastoid air cells are otherwise clear. IMPRESSION: 1. No acute  intracranial abnormality or significant interval change. 2. Stable thin extra-axial collections or dural thickening. 3. Atrophy and white matter disease is moderately advanced for age. This likely reflects the sequela of chronic microvascular ischemia. 4. Remote lacunar infarcts in the basal ganglia bilaterally. Electronically Signed   By: Audree Leas M.D.   On: 11/05/2023 13:22   Scheduled Meds:  sodium chloride    Intravenous Once   amiodarone   100 mg Oral Daily   calcitRIOL   1.75 mcg Oral Q M,W,F-HD   Chlorhexidine  Gluconate Cloth  6 each Topical Q0600   cinacalcet   30 mg Oral Q M,W,F-HD   folic acid   1 mg Oral Daily   hydrALAZINE   25 mg Oral Q8H   isosorbide  mononitrate  30 mg Oral Daily   levETIRAcetam   500 mg Oral Daily   levothyroxine   50 mcg Oral Q0600   multivitamin  1 tablet Oral Daily   rosuvastatin   10 mg Oral QHS   Continuous Infusions:   LOS: 3 days   Time spent: 57 mins  Shawna Geffre Lincoln Renshaw, MD How to contact the [facility] Attending or Consulting provider 7A - 7P or covering provider during after hours 7P -7A, for this patient?  Check the care team in [facility] and look for a) attending/consulting TR[facility] provider listed and b) the TRH team listed Log into [email or web address] to find provider on call.  Locate the TR[facility] provider you are looking for under [facility] and page to a number that you can be directly reached. If you still have difficulty reaching the provider, please page the [facility] (Director on Call) for the Hospitalists listed on amion for assistance.  11/07/2023, 12:21 PM

## 2023-11-07 NOTE — Progress Notes (Addendum)
 Patient Name: Shawna [name] Date of Encounter: 11/07/2023 [facility] Cardiologist: Armida Lander, MD    Interval Summary  .    Says she doesn't feel well. Initially she said she had chest pain but when I laid her head back she said she didn't and fell asleep  Vital Signs .    Vitals:   11/06/23 2037 11/07/23 0459 11/07/23 0800 11/07/23 0859  BP: (!) 166/57 (!) 172/71  (!) 151/61  Pulse: 63 74  71  Resp:   (!) 21   Temp: 97.9 F (36.6 C) 98.3 F (36.8 C)    TempSrc: Oral Oral    SpO2: 100% 94%  96%  Weight:      Height:        Intake/Output Summary (Last 24 hours) at 11/07/2023 0929 Last data filed at 11/06/2023 1358 Gross per 24 hour  Intake --  Output 2000 ml  Net -2000 ml      11/06/2023    1:58 PM 11/06/2023   10:35 AM 11/04/2023    5:01 PM  Last 3 Weights  Weight (lbs) 111 lb 1.8 oz 115 lb 8.3 oz 121 lb 4.1 oz  Weight (kg) 50.4 kg 52.4 kg 55 kg      Telemetry/ECG    SB-NSR with some runs Afib 120/m this am - Personally Reviewed  Physical Exam .   GEN: No acute distress.   Neck: No JVD Cardiac: irreg irreg 2/6 systolic murmur LSB Respiratory: decreased breath sounds but clear GI: Soft, nontender, non-distended  MS: No edema  Assessment & Plan .     Afib/aflutter/atach - issues with bradycardia during Jan 2025 admission - lopressor  25mg  bid and amio 100mg  daily  were stoppeed during that admission.  -lopressor  and amio restartred during 09/2023 admission. SHe was discharged on amio 400mg  bid x 7 days, then 200mg  bid x 3 weeks and metoprolol  12.5mg  bid - issues with bradycardia this admission.    - home lopressor  stopped, oral amio lowered to 100mg  daily.  - currently SR in the 60s but some Afib 120/m this am, continue amio at 100mg . Do not restart her lopressor    Anemia - long history of recurrent GI bleeds, anemia requiring transfusions - recurrent issues with bleeding GI AVMs  -Hbg 8.9 yest  Chronic HFmrEF - fluid management per  HD - medical therapy limited by low bp's in the past particularly during HD  HTN uncontrolled since metoprolol  stopped and no IV access, currently on hydralazine  25 mg q8. Will add imdur  30 mg daily (amlodipine  listed as an allergy-swelling)    Valvular heart disease with mod MR, mild to mod MS  CAD s/p CABG 2013, last cath 2023 patent grafts, no recent angina but mention of chest pain this am but went away when I repositioned her and she fell asleep.     For questions or updates, please contact [facility] Please consult [email or web address] for contact info under        Signed, Theotis Flake, PA-C   Personally seen and examined. Agree with above.   Has been feeling poorly.  Sinus rhythm but paroxysmal atrial fibrillation with occasional heart rates in the 120s. Has a regular rhythm with systolic murmur.  Resting in chair.  Telemetry with sinus rhythm 70 currently.  Had a brief episode of what appears to be atrial tachycardia.  Atrial fibrillation/tachycardia - Has had tachycardia-bradycardia syndrome with recent bradycardia in January 2025 admission - Amiodarone  100 mg a day as well as Lopressor   25 mg twice a day were stopped during prior admission.  They were both then again restarted in March admission then bradycardia occurred again with oral amiodarone  lowering to 100 mg a day -We will avoid restarting her Lopressor .  Chronic systolic heart failure moderately reduced ejection fraction - She is on hemodialysis.  Dr. Shaune Delaine with nephrology note reviewed.  Has had hypotension during dialysis. -Labile hypertension, added isosorbide  30 mg a day  Moderate MR, mild to moderate mitral stenosis-continue to monitor clinically.  CAD CABG 2013 with patent grafts on cardiac catheterization in 2023 DNR Dorothye Gathers, MD

## 2023-11-08 DIAGNOSIS — I495 Sick sinus syndrome: Secondary | ICD-10-CM

## 2023-11-08 DIAGNOSIS — R627 Adult failure to thrive: Secondary | ICD-10-CM | POA: Diagnosis not present

## 2023-11-08 DIAGNOSIS — K922 Gastrointestinal hemorrhage, unspecified: Secondary | ICD-10-CM | POA: Diagnosis not present

## 2023-11-08 DIAGNOSIS — Z66 Do not resuscitate: Secondary | ICD-10-CM | POA: Diagnosis not present

## 2023-11-08 DIAGNOSIS — D649 Anemia, unspecified: Secondary | ICD-10-CM | POA: Diagnosis not present

## 2023-11-08 LAB — CBC
HCT: 28.1 % — ABNORMAL LOW (ref 36.0–46.0)
Hemoglobin: 9.3 g/dL — ABNORMAL LOW (ref 12.0–15.0)
MCH: 33.6 pg (ref 26.0–34.0)
MCHC: 33.1 g/dL (ref 30.0–36.0)
MCV: 101.4 fL — ABNORMAL HIGH (ref 80.0–100.0)
Platelets: 174 10*3/uL (ref 150–400)
RBC: 2.77 MIL/uL — ABNORMAL LOW (ref 3.87–5.11)
RDW: 26.6 % — ABNORMAL HIGH (ref 11.5–15.5)
WBC: 5.3 10*3/uL (ref 4.0–10.5)
nRBC: 0 % (ref 0.0–0.2)

## 2023-11-08 LAB — RENAL FUNCTION PANEL
Albumin: 2.9 g/dL — ABNORMAL LOW (ref 3.5–5.0)
Anion gap: 13 (ref 5–15)
BUN: 38 mg/dL — ABNORMAL HIGH (ref 8–23)
CO2: 25 mmol/L (ref 22–32)
Calcium: 8.5 mg/dL — ABNORMAL LOW (ref 8.9–10.3)
Chloride: 103 mmol/L (ref 98–111)
Creatinine, Ser: 4.76 mg/dL — ABNORMAL HIGH (ref 0.44–1.00)
GFR, Estimated: 9 mL/min — ABNORMAL LOW (ref >60)
Glucose, Bld: 114 mg/dL — ABNORMAL HIGH (ref 70–99)
Phosphorus: 4.3 mg/dL (ref 2.5–4.6)
Potassium: 3.7 mmol/L (ref 3.5–5.1)
Sodium: 141 mmol/L (ref 135–145)

## 2023-11-08 LAB — MAGNESIUM: Magnesium: 1.6 mg/dL — ABNORMAL LOW (ref 1.7–2.4)

## 2023-11-08 MED ORDER — METOPROLOL TARTRATE 5 MG/5ML IV SOLN
2.5000 mg | Freq: Once | INTRAVENOUS | Status: AC
  Administered 2023-11-08: 2.5 mg via INTRAVENOUS
  Filled 2023-11-08: qty 5

## 2023-11-08 MED ORDER — AMIODARONE HCL 200 MG PO TABS
100.0000 mg | ORAL_TABLET | Freq: Two times a day (BID) | ORAL | Status: DC
  Administered 2023-11-08 (×2): 100 mg via ORAL
  Filled 2023-11-08 (×2): qty 1

## 2023-11-08 MED ORDER — POTASSIUM CHLORIDE CRYS ER 20 MEQ PO TBCR: 40.0000 meq | EXTENDED_RELEASE_TABLET | Freq: Once | ORAL | Status: DC

## 2023-11-08 MED ORDER — HEPARIN SODIUM (PORCINE) 1000 UNIT/ML IJ SOLN
INTRAMUSCULAR | Status: AC
Start: 2023-11-08 — End: ?
  Filled 2023-11-08: qty 4

## 2023-11-08 MED ORDER — MAGNESIUM SULFATE 2 GM/50ML IV SOLN
2.0000 g | Freq: Once | INTRAVENOUS | Status: AC
Start: 2023-11-08 — End: 2023-11-08
  Administered 2023-11-08: 2 g via INTRAVENOUS
  Filled 2023-11-08: qty 50

## 2023-11-08 MED ORDER — METOPROLOL TARTRATE 5 MG/5ML IV SOLN
INTRAVENOUS | Status: AC
Start: 2023-11-08 — End: ?
  Filled 2023-11-08: qty 5

## 2023-11-08 NOTE — Progress Notes (Signed)
 Pt goal not met d/t hypotension. 85/58 mmhg during tx.   11/08/23 1449  Vitals  Temp 97.7 F (36.5 C)  Temp Source Oral  BP 98/60  BP Location Right Arm  BP Method Automatic  Patient Position (if appropriate) Lying  Resp 20  During Treatment Monitoring  HD Safety Checks Performed Yes  Intra-Hemodialysis Comments Tx completed  Post Treatment  Dialyzer Clearance Lightly streaked  Hemodialysis Intake (mL) 0 mL  Liters Processed 78  Fluid Removed (mL) 600 mL  Tolerated HD Treatment Yes  Post-Hemodialysis Comments Pt goal not met d/t low BP.  Hemodialysis Catheter Left Subclavian Double lumen Temporary (Non-Tunneled)  Placement Date: 06/09/22   Placed prior to admission: Yes  Orientation: Left  Access Location: Subclavian  Hemodialysis Catheter Type: Double lumen Temporary (Non-Tunneled)  Site Condition No complications  Blue Lumen Status Heparin  locked  Red Lumen Status Heparin  locked  Catheter fill solution Heparin  1000 units/ml  Catheter fill volume (Arterial) 1.9 cc  Catheter fill volume (Venous) 1.9  Dressing Type Gauze/Drain sponge;Transparent  Dressing Status Antimicrobial disc/dressing in place  Interventions New dressing  Drainage Description None  Dressing Change Due 11/11/23  Post treatment catheter status Capped and Clamped

## 2023-11-08 NOTE — Progress Notes (Signed)
 [facility] Progress Note  Name: Shawna [name] MRN: [id number] DOB: 07-29-1940  Chief Complaint:  Low hemoglobin, dizziness, fatigue  Subjective:  Last HD on 4/28 with 2 kg UF.  She is more interactive this morning.   She states that feels better today and remembers yesterday feeling poorly.      Review of systems:    Denies shortness of breath. No chest pain Nausea this AM - better now. No vomiting. Some stomach pain. Slight diarrhea per pt   -----------------  Background on consult:  Shawna [name] is a 83 y.o. female with PMH significant for ESRD (on HD MWF at [facility]), CAD s/p CABG, HFrEF, LBBB, DM type 2, HTN, PAF not on anticoagulation due to recurrent GI bleeds from small bowel AVMs, h/o TIA, colon cancer, and GERD who was presented to [facility] ED after being told by her HD unit that her hgb was low at 6.9. She did c/o increased fatigue and dizziness with dark stools. In the ED, she was afebrile and stable vital signs but HR was 42.  Stool was guaiac +.  Labs were notable for K 3.1, gluc 152, Ca 7.7, Hgb 8.3.  She was admitted and we were  consulted to provide HD during her hospitalization.  She did get HD on 11/04/23.     Intake/Output Summary (Last 24 hours) at 11/08/2023 0955 Last data filed at 11/08/2023 0914 Gross per 24 hour  Intake 960 ml  Output --  Net 960 ml    Vitals:  Vitals:   11/07/23 2029 11/07/23 2123 11/08/23 0213 11/08/23 0452  BP: (!) 205/91 (!) 157/100 (!) 150/104 (!) 173/40  Pulse: 75 88  84  Resp: 18  18 19   Temp: 98.1 F (36.7 C)  98 F (36.7 C) 97.9 F (36.6 C)  TempSrc: Oral  Oral Oral  SpO2: 97% 100% 95% 95%  Weight:      Height:         Physical Exam:  General elderly female in bed in no acute distress HEENT normocephalic atraumatic extraocular movements intact sclera anicteric Neck supple trachea midline Lungs clear to auscultation bilaterally normal work of breathing at rest on room air  Heart S1S2 no rub Abdomen soft  nontender nondistended Extremities no edema  Psych - smiles at a joke; limited speech  Neuro - much more interactive and conversant. oriented to person; year is "September".  She recognizes this place but can't think of name  Left internal jugular tunneled dialysis catheter; Left arm access without bruit; band in place   Medications reviewed   Labs:     Latest Ref Rng & Units 11/08/2023    4:59 AM 11/07/2023    4:38 AM 11/06/2023    4:28 AM  BMP  Glucose 70 - 99 mg/dL 952  98  841   BUN 8 - 23 mg/dL 38  20  32   Creatinine 0.44 - 1.00 mg/dL 3.24  4.01  0.27   Sodium 135 - 145 mmol/L 141  133  137   Potassium 3.5 - 5.1 mmol/L 3.7  3.6  3.8   Chloride 98 - 111 mmol/L 103  99  100   CO2 22 - 32 mmol/L 25  23  22    Calcium  8.9 - 10.3 mg/dL 8.5  8.2  8.0    Dialysis Orders:   Center: [facility]  on MWF  EDW 53.5 kg (but losing weight per HD RN) HD Bath 2K/2.5Ca  Time 4:00 Heparin  none.  Access LIJ TDC BFR 300 DFR 500    Micera 200 mcg every 2 weeks - last given on 4/21 Meds: Calcitriol  1.5 mcg 3 times a week, sensipar  30 mg three times a week   Last post weight: on last wed 54.1 kg    Assessment/Plan:   ESRD  - HD per MWF schedule   Symptomatic anemia from acute blood loss - GI bleed  -acute on chronic blood loss anemia due to small bowel AVM's. - PRBC's per primary team  - Hb stable today   Acute metabolic encephalopathy -she became acutely lethargic and confused and so had a CT scan without intracranial abnormality.  Discharge was held for same per charting. - she has been titrated off of her beta blocker this admission - not sure if this could be playing a role - Much better today on my exam    Chronic combined systolic and diastolic CHF exacerbation -UF as tolerated with HD to optimize volume status    Volume/ hypertension  -UF as tolerated with HD -CXR initially with pulmonary edema   Anemia of Chronic Kidney Disease -Transfusions per primary team  - on ESA -  she recently got mircera 200 mcg on 4/21 so not yet due for redose   Secondary Hyperparathyroidism/Hyperphosphatemia - on calcitriol  and sensipar  - no binders on med list and phos acceptable    Elevated troponin -thought to be secondary to demand ischemia secondary to sepsis.  - Per primary team   Paroxysmal Afib -not on a/c given history of anemia/GIB -per primary team  - note that her metoprolol  was discontinued due to severe symptomatic bradycardia per primary team   Disposition - per primary team   Nan Aver, MD 11/08/2023 10:08 AM

## 2023-11-08 NOTE — Progress Notes (Addendum)
 PROGRESS NOTE   Shawna [name]  ZOX:[id number] DOB: [birth year] DOA: 11/03/2023 PCP: [facility]   Chief Complaint  Patient presents with   low hemoglobin   Level of care: Telemetry  Brief Admission History:  83 y.o. female,  with a history of AVMs with recurrent GI bleeding, anemia, HFrEF, CAD s/p CABG on aspirin  81mg , PAF, ESRD on HD MWF, T2DM, and colon CA s/p resection 1992 patient well-known to our service with multiple presentation, hospitalization, has known history of acute on chronic blood loss anemia given history of AVMs requiring multiple admissions for transfusions.   - Presents to ED as instructed by her dialysis center for low hemoglobin as she was told at 6.9, she does report increased fatigue, dizziness, weakness, and dark stools, denies nausea, vomiting or abdominal pain, she is not on any aspirin  or anticoagulation, during recent hospitalization she was discharged on metoprolol  and amiodarone .  - In ED labs were significant for stable hemoglobin at 8.3, but she was Hemoccult positive, even though her stool color with no melena, as well she was noted bradycardic heart rate at 41, so triage hospitalist consulted to admit.   Assessment and Plan:  Acute metabolic encephalopathy - Improving - Pt acutely became lethargic, confused at bedside shortly after she was seen to be alert and conversant - pt was sent for STAT CT head; no acute findings - vitals revealed BP up to 185/77 - STAT CXR 4/27 -- findings of pulmonary edema - HD given 4/28, 4/30  - DISCHARGE HELD on 4/27  - HD planned for today 4/28 - plan for HD on schedule 4/28 with our inpatient nephrology team - she is extremely frail and hopefully will return to baseline with next HD treatment   Symptomatic anemia Acute on chronic blood loss anemia Anemia of chronic kidney disease History of AVM Hemoccult positive stool - Patient with known history of anemia, history of GI bleed due to small bowel AVM,  previous recommendation has been made for supportive transfusion and long acting somatostatin which she has been receiving. - Hemoglobin 7.0, transfused 1 unit PRBC with HD, Hg trending down daily, now 9 - discussed with GI, no plans for further procedures this admission - she is to take her monthly long acting octreotide  20 mg injection when she gets home, our pharmacy does not have the medication in hospital.   Severe symptomatic Sinus bradycardia History of paroxysmal A-fib with RVR -Bradycardia is most likely in the recent of recently resumed amiodarone  and metoprolol  -pt does not seem to be able to tolerate amiodarone  and metoprolol  given recurrent hospitalizations, hr is in the 40s, initially we held amiodarone  and metoprolol  - ED discussed with cardiologist Dr. Paulita Boss who recommended to reduce amiodarone  to 100 mg and DC metoprolol     - Not on anticoagulation secondary to recurrent GI bleed - when we discontinued both the amio and metoprolol  she returned with PAF with RVR  - for now we have resumed amiodarone  100 mg daily, due to tachycardia, Dr. Amanda Jungling increased to 100 mg BID on 4/30 and gave 1 time dose of lopressor  2.5 mg.  Uncontrolled HTN - we had to DC the metoprolol  due to severe symptomatic bradycardia - now we are dealing with very high BPs - we have started hydralazine  25 mg TID and cardiology team added imdur  30 mg daily  - pt currently does not have IV access and staff unable to obtain IV after multiple attempts   Hypokalemia - She is ESRD patient, received  low-dose replacement only as needed    ESRD:  - Continue routine HD MWF per nephrology, she missed hemodialysis yesterday - HD today ordered per Dr. Yvonnie Heritage   Hypothyroidism: - Continue with Synthroid    Chronic combined HFrEF, CAD, HTN, HLD: - hx CABG, no chest pain. Last echo Feb 2025 w/LVEF 45-50%, G2DD.  - Volume management with HD. - Continue with statin - Hold beta-blocker due to bradycardia, not a  candidate for any antiplatelet therapy due to recurrent GI bleed - Not a candidate for any GDMT due to low blood pressure   DVT prophylaxis: SCDs Code Status: DNR Family Communication: husband Disposition: DC home when medically stable, for now she is having uncontrolled heart rates with adjustments being made to cardiac medications   Consultants:  Nephrology GI cardiology Procedures:  Hemodialysis 11/04/23, 4/28, 4/30   Antimicrobials:    Subjective: Pt keeps telling me she is feeling terrible.  She denies CP and palpitations but her HRs have been elevated.    Objective: Vitals:   11/08/23 1121 11/08/23 1126 11/08/23 1130 11/08/23 1230  BP: 124/72 105/72    Pulse: 88 88 (!) 58   Resp: 18 18 18 18   Temp: 98 F (36.7 C)     TempSrc: Oral     SpO2: 97%     Weight:      Height:        Intake/Output Summary (Last 24 hours) at 11/08/2023 1307 Last data filed at 11/08/2023 0914 Gross per 24 hour  Intake 960 ml  Output --  Net 960 ml   Filed Weights   11/06/23 1035 11/06/23 1358 11/08/23 1120  Weight: 52.4 kg 50.4 kg 50.8 kg   Examination:  General exam: frail, elderly female, awake, intermittently lethargic; cooperative, intermittently confused, Appears calm and comfortable  Respiratory system: crackles heard at bases. No increased work of breathing seen. Sitting up in bed. Cardiovascular system: tachycardic normal S1 & S2 heard. No JVD, murmurs, rubs, gallops or clicks. No pedal edema. Gastrointestinal system: Abdomen is nondistended, soft and nontender. No organomegaly or masses felt. Normal bowel sounds heard. Central nervous system: Alert and oriented. No focal neurological deficits. Extremities: Symmetric 5 x 5 power. Skin: No rashes, lesions or ulcers. Psychiatry: Judgement and insight appear diminshed. Mood & affect appropriate.   Data Reviewed: I have personally reviewed following labs and imaging studies  CBC: Recent Labs  Lab 11/03/23 1521 11/04/23 0458  11/05/23 0839 11/06/23 0428 11/07/23 0438 11/08/23 0459  WBC 5.5 4.9 4.6 4.1  --  5.3  NEUTROABS 3.5  --   --   --   --   --   HGB 8.3* 7.0* 10.4* 9.4* 8.9* 9.3*  HCT 25.8* 21.5* 31.0* 29.4* 26.6* 28.1*  MCV 107.1* 103.9* 99.0 103.5*  --  101.4*  PLT 162 178 150 149*  --  174    Basic Metabolic Panel: Recent Labs  Lab 11/03/23 1305 11/04/23 0458 11/06/23 0428 11/07/23 0438 11/08/23 0459  NA 134* 136 137 133* 141  K 3.1* 4.2 3.8 3.6 3.7  CL 97* 102 100 99 103  CO2 22 21* 22 23 25   GLUCOSE 152* 133* 123* 98 114*  BUN 36* 40* 32* 20 38*  CREATININE 4.29* 4.71* 4.24* 3.27* 4.76*  CALCIUM  7.7* 7.7* 8.0* 8.2* 8.5*  MG  --   --   --   --  1.6*  PHOS  --   --  3.9 3.2 4.3    CBG: Recent Labs  Lab 11/04/23 1141 11/07/23  1116 11/07/23 1609 11/07/23 2024  GLUCAP 96 141* 148* 97    No results found for this or any previous visit (from the past 240 hours).   Radiology Studies: No results found.  Scheduled Meds:  sodium chloride    Intravenous Once   amiodarone   100 mg Oral BID   calcitRIOL   1.75 mcg Oral Q M,W,F-HD   Chlorhexidine  Gluconate Cloth  6 each Topical Q0600   Chlorhexidine  Gluconate Cloth  6 each Topical Q0600   cinacalcet   30 mg Oral Q M,W,F-HD   folic acid   1 mg Oral Daily   hydrALAZINE   25 mg Oral Q8H   isosorbide  mononitrate  30 mg Oral Daily   levETIRAcetam   500 mg Oral Daily   levothyroxine   50 mcg Oral Q0600   multivitamin  1 tablet Oral Daily   rosuvastatin   10 mg Oral QHS   Continuous Infusions:   LOS: 4 days   Time spent: 55 mins  Xenia Nile Lincoln Renshaw, MD How to contact the [facility] Attending or Consulting provider 7A - 7P or covering provider during after hours 7P -7A, for this patient?  Check the care team in [facility] and look for a) attending/consulting TRH provider listed and b) the TRH team listed Log into [email or web address] to find provider on call.  Locate the TRH provider you are looking for under [facility] and page to a number that you  can be directly reached. If you still have difficulty reaching the provider, please page the [facility] (Director on Call) for the Hospitalists listed on amion for assistance.  11/08/2023, 1:07 PM

## 2023-11-08 NOTE — Plan of Care (Signed)
   Problem: Education: Goal: Knowledge of General Education information will improve Description: Including pain rating scale, medication(s)/side effects and non-pharmacologic comfort measures Outcome: Progressing   Problem: Nutrition: Goal: Adequate nutrition will be maintained Outcome: Progressing   Problem: Safety: Goal: Ability to remain free from injury will improve Outcome: Progressing

## 2023-11-08 NOTE — Progress Notes (Signed)
 Mobility Specialist Progress Note:    11/08/23 0940  Mobility  Activity Refused mobility   Pt politely refused mobility, stated "I can't walk my feet hurt". All needs met.  Glinda Lapping Mobility Specialist Please contact via Special educational needs teacher or  Rehab office at [phone]

## 2023-11-08 NOTE — Progress Notes (Signed)
    Telemetry reviewed, some episodes of atach vs aflutter. Managament of arrhythmias complicated by intermittent bradycardia. Continue amio 100mg  daily, will dose IV lopressor  2.5mg  x 1 this AM. Will need outpatient EP evaluation for tachy brady syndrome. Increase amio to 100mg  bid.   We will continue to monitor telemetry during admission.   Armida Lander MD    For questions or updates, please contact [facility] Please consult [email or web address] for contact info under        Signed, Armida Lander, MD  11/08/2023, 8:45 AM

## 2023-11-08 NOTE — Telephone Encounter (Signed)
Patient still inpatient.

## 2023-11-08 NOTE — Plan of Care (Signed)

## 2023-11-09 ENCOUNTER — Encounter (HOSPITAL_COMMUNITY): Payer: Self-pay | Admitting: Family Medicine

## 2023-11-09 DIAGNOSIS — R001 Bradycardia, unspecified: Secondary | ICD-10-CM | POA: Diagnosis not present

## 2023-11-09 DIAGNOSIS — I4892 Unspecified atrial flutter: Secondary | ICD-10-CM | POA: Diagnosis not present

## 2023-11-09 DIAGNOSIS — D649 Anemia, unspecified: Secondary | ICD-10-CM | POA: Diagnosis not present

## 2023-11-09 DIAGNOSIS — R531 Weakness: Secondary | ICD-10-CM | POA: Diagnosis not present

## 2023-11-09 DIAGNOSIS — K31819 Angiodysplasia of stomach and duodenum without bleeding: Secondary | ICD-10-CM

## 2023-11-09 DIAGNOSIS — I25118 Atherosclerotic heart disease of native coronary artery with other forms of angina pectoris: Secondary | ICD-10-CM | POA: Diagnosis not present

## 2023-11-09 LAB — RENAL FUNCTION PANEL
Albumin: 2.6 g/dL — ABNORMAL LOW (ref 3.5–5.0)
Anion gap: 12 (ref 5–15)
BUN: 22 mg/dL (ref 8–23)
CO2: 25 mmol/L (ref 22–32)
Calcium: 8.4 mg/dL — ABNORMAL LOW (ref 8.9–10.3)
Chloride: 101 mmol/L (ref 98–111)
Creatinine, Ser: 3.08 mg/dL — ABNORMAL HIGH (ref 0.44–1.00)
GFR, Estimated: 14 mL/min — ABNORMAL LOW (ref >60)
Glucose, Bld: 119 mg/dL — ABNORMAL HIGH (ref 70–99)
Phosphorus: 2.9 mg/dL (ref 2.5–4.6)
Potassium: 3.4 mmol/L — ABNORMAL LOW (ref 3.5–5.1)
Sodium: 138 mmol/L (ref 135–145)

## 2023-11-09 LAB — CBC
HCT: 27 % — ABNORMAL LOW (ref 36.0–46.0)
Hemoglobin: 8.8 g/dL — ABNORMAL LOW (ref 12.0–15.0)
MCH: 33.3 pg (ref 26.0–34.0)
MCHC: 32.6 g/dL (ref 30.0–36.0)
MCV: 102.3 fL — ABNORMAL HIGH (ref 80.0–100.0)
Platelets: 155 10*3/uL (ref 150–400)
RBC: 2.64 MIL/uL — ABNORMAL LOW (ref 3.87–5.11)
RDW: 26.6 % — ABNORMAL HIGH (ref 11.5–15.5)
WBC: 4.1 10*3/uL (ref 4.0–10.5)
nRBC: 0 % (ref 0.0–0.2)

## 2023-11-09 LAB — MAGNESIUM: Magnesium: 2.1 mg/dL (ref 1.7–2.4)

## 2023-11-09 MED ORDER — METOPROLOL TARTRATE 5 MG/5ML IV SOLN: 2.5000 mg | Freq: Once | INTRAVENOUS | Status: DC

## 2023-11-09 MED ORDER — METOPROLOL TARTRATE 25 MG PO TABS
25.0000 mg | ORAL_TABLET | Freq: Three times a day (TID) | ORAL | Status: DC
  Administered 2023-11-09: 25 mg via ORAL
  Filled 2023-11-09: qty 1

## 2023-11-09 MED ORDER — AMIODARONE HCL 200 MG PO TABS
200.0000 mg | ORAL_TABLET | Freq: Two times a day (BID) | ORAL | Status: DC
  Administered 2023-11-09: 200 mg via ORAL
  Filled 2023-11-09: qty 1

## 2023-11-09 MED ORDER — POTASSIUM CHLORIDE CRYS ER 20 MEQ PO TBCR
40.0000 meq | EXTENDED_RELEASE_TABLET | Freq: Once | ORAL | Status: DC
  Filled 2023-11-09: qty 2

## 2023-11-09 MED ORDER — CHLORHEXIDINE GLUCONATE CLOTH 2 % EX PADS: 6.0000 | MEDICATED_PAD | Freq: Every day | CUTANEOUS | Status: DC

## 2023-11-09 MED ORDER — CHLORHEXIDINE GLUCONATE CLOTH 2 % EX PADS
6.0000 | MEDICATED_PAD | Freq: Every day | CUTANEOUS | Status: DC
  Administered 2023-11-09 – 2023-11-12 (×4): 6 via TOPICAL

## 2023-11-09 MED ORDER — SODIUM CHLORIDE 0.9 % IV BOLUS
250.0000 mL | Freq: Once | INTRAVENOUS | Status: AC
  Administered 2023-11-09: 250 mL via INTRAVENOUS

## 2023-11-09 MED ORDER — FLUTICASONE PROPIONATE 50 MCG/ACT NA SUSP
2.0000 | Freq: Every day | NASAL | Status: AC
  Administered 2023-11-09 – 2023-11-11 (×3): 2 via NASAL
  Filled 2023-11-09: qty 16

## 2023-11-09 NOTE — Plan of Care (Signed)

## 2023-11-09 NOTE — Progress Notes (Signed)
 [facility] Progress Note  Name: Shawna [name] MRN: [id number] DOB: 02/24/1941  Chief Complaint:  Low hemoglobin, dizziness, fatigue  Subjective:  Last HD on 4/30 with 600 mL UF (UF limited by hypotension - goal had been 1-1.5 kg as tolerated).  She has been tachycardic in the interim.  Cardiology noted some episodes of atach vs. Aflutter per 4/30 note - they gave IV lopressor  2.5 mg once and increased amio.  They indicate that she will need outpatient EP eval for tachy-brady syndrome.  She is less talkative and more sleepy again this AM   Review of systems:     States that her breathing is "fair". No chest pain No n/v  -----------------  Background on consult:  Shawna [name] is a 83 y.o. female with PMH significant for ESRD (on HD MWF at [facility]), CAD s/p CABG, HFrEF, LBBB, DM type 2, HTN, PAF not on anticoagulation due to recurrent GI bleeds from small bowel AVMs, h/o TIA, colon cancer, and GERD who was presented to [facility] ED after being told by her HD unit that her hgb was low at 6.9. She did c/o increased fatigue and dizziness with dark stools. In the ED, she was afebrile and stable vital signs but HR was 42.  Stool was guaiac +.  Labs were notable for K 3.1, gluc 152, Ca 7.7, Hgb 8.3.  She was admitted and we were  consulted to provide HD during her hospitalization.  She did get HD on 11/04/23.     Intake/Output Summary (Last 24 hours) at 11/09/2023 1007 Last data filed at 11/08/2023 1449 Gross per 24 hour  Intake --  Output 600 ml  Net -600 ml    Vitals:  Vitals:   11/09/23 0359 11/09/23 0500 11/09/23 0726 11/09/23 0756  BP: (!) 110/92 (!) 110/92  106/89  Pulse: (!) 124   (!) 125  Resp: 16  (!) 23 18  Temp: 98.2 F (36.8 C)     TempSrc: Oral     SpO2: 95%   97%  Weight:      Height:         Physical Exam:    General elderly female in bed in no acute distress HEENT normocephalic atraumatic extraocular movements intact sclera anicteric Neck supple trachea  midline Lungs clear to auscultation bilaterally normal work of breathing at rest on room air  Heart S1S2 no rub Abdomen soft nontender nondistended Extremities no edema  Neuro - less interactive today. oriented to person; year is "2024" and we are at a hospital  Access - Left internal jugular tunneled dialysis catheter; Left arm access without bruit; band in place   Medications reviewed   Labs:     Latest Ref Rng & Units 11/09/2023    4:10 AM 11/08/2023    4:59 AM 11/07/2023    4:38 AM  BMP  Glucose 70 - 99 mg/dL 409  811  98   BUN 8 - 23 mg/dL 22  38  20   Creatinine 0.44 - 1.00 mg/dL 9.14  7.82  9.56   Sodium 135 - 145 mmol/L 138  141  133   Potassium 3.5 - 5.1 mmol/L 3.4  3.7  3.6   Chloride 98 - 111 mmol/L 101  103  99   CO2 22 - 32 mmol/L 25  25  23    Calcium  8.9 - 10.3 mg/dL 8.4  8.5  8.2    Dialysis Orders:   Center: [facility]  on MWF  EDW 53.5 kg (but losing weight per HD RN) HD Bath 2K/2.5Ca  Time 4:00 Heparin  none. Access LIJ TDC BFR 300 DFR 500    Micera 200 mcg every 2 weeks - last given on 4/21 Meds: Calcitriol  1.5 mcg 3 times a week, sensipar  30 mg three times a week   Last post weight: on last wed 54.1 kg    Assessment/Plan:   ESRD  - HD per MWF schedule - she is most recently below her EDW but has been tachycardic  - limited UF with HD on Friday if here - I am giving 250 mL IV once of normal saline  - renal panel in AM    Symptomatic anemia from acute blood loss - GI bleed  -acute on chronic blood loss anemia due to small bowel AVM's. - PRBC's per primary team    Acute metabolic encephalopathy -she became acutely lethargic and confused and so had a CT scan without intracranial abnormality.  Discharge was held for same per charting. - she has been titrated off of her beta blocker this admission - not sure if this could be playing a role - better on 4/30 on my exam however less interactive again on 5/1 - per primary team    Chronic combined systolic  and diastolic CHF exacerbation -UF as tolerated with HD to optimize volume status    Volume/ hypertension  -UF as tolerated with HD -CXR initially with pulmonary edema   Anemia of Chronic Kidney Disease -Transfusions per primary team  - on ESA - she recently got mircera 200 mcg on 4/21 so not yet due for redose   Secondary Hyperparathyroidism/Hyperphosphatemia - on calcitriol  and sensipar  - no binders on med list and phos acceptable    Paroxysmal Afib -not on a/c given history of anemia/GIB per charting -per cardiology and primary team  - note that her metoprolol  was discontinued due to severe symptomatic bradycardia per primary team   Disposition - per primary team   Nan Aver, MD 11/09/2023 10:25 AM

## 2023-11-09 NOTE — Progress Notes (Signed)
 Rounding Note    Patient Name: Shawna [name] Date of Encounter: 11/09/2023  [facility] Cardiologist: Armida Lander, MD   Subjective   No complaints  Inpatient Medications    Scheduled Meds:  sodium chloride    Intravenous Once   amiodarone   100 mg Oral BID   calcitRIOL   1.75 mcg Oral Q M,W,F-HD   Chlorhexidine  Gluconate Cloth  6 each Topical Q0600   Chlorhexidine  Gluconate Cloth  6 each Topical Q0600   cinacalcet   30 mg Oral Q M,W,F-HD   folic acid   1 mg Oral Daily   hydrALAZINE   25 mg Oral Q8H   isosorbide  mononitrate  30 mg Oral Daily   levETIRAcetam   500 mg Oral Daily   levothyroxine   50 mcg Oral Q0600   multivitamin  1 tablet Oral Daily   rosuvastatin   10 mg Oral QHS   Continuous Infusions:  PRN Meds: acetaminophen , albuterol , famotidine , hydrALAZINE , hydrOXYzine , nitroGLYCERIN    Vital Signs    Vitals:   11/09/23 0359 11/09/23 0500 11/09/23 0726 11/09/23 0756  BP: (!) 110/92 (!) 110/92  106/89  Pulse: (!) 124   (!) 125  Resp: 16  (!) 23 18  Temp: 98.2 F (36.8 C)     TempSrc: Oral     SpO2: 95%   97%  Weight:      Height:        Intake/Output Summary (Last 24 hours) at 11/09/2023 0806 Last data filed at 11/08/2023 1449 Gross per 24 hour  Intake 0 ml  Output 600 ml  Net -600 ml      11/08/2023    2:49 PM 11/08/2023   11:20 AM 11/06/2023    1:58 PM  Last 3 Weights  Weight (lbs) 110 lb 10.7 oz 111 lb 15.9 oz 111 lb 1.8 oz  Weight (kg) 50.2 kg 50.8 kg 50.4 kg      Telemetry    Aflutter vs atach elevated rates - Personally Reviewed  ECG    N/a - Personally Reviewed  Physical Exam   GEN: No acute distress.   Neck: No JVD Cardiac: RRR, no murmurs, rubs, or gallops.  Respiratory: Clear to auscultation bilaterally. GI: Soft, nontender, non-distended  MS: No edema; No deformity. Neuro:  Nonfocal  Psych: Normal affect   Labs    High Sensitivity Troponin:  No results for input(s): "TROPONINIHS" in the last 720 hours.    Chemistry Recent Labs  Lab 11/07/23 0438 11/08/23 0459 11/09/23 0410  NA 133* 141 138  K 3.6 3.7 3.4*  CL 99 103 101  CO2 23 25 25   GLUCOSE 98 114* 119*  BUN 20 38* 22  CREATININE 3.27* 4.76* 3.08*  CALCIUM  8.2* 8.5* 8.4*  MG  --  1.6* 2.1  ALBUMIN  2.7* 2.9* 2.6*  GFRNONAA 13* 9* 14*  ANIONGAP 11 13 12     Lipids No results for input(s): "CHOL", "TRIG", "HDL", "LABVLDL", "LDLCALC", "CHOLHDL" in the last 168 hours.  Hematology Recent Labs  Lab 11/06/23 0428 11/07/23 0438 11/08/23 0459 11/09/23 0410  WBC 4.1  --  5.3 4.1  RBC 2.84*  --  2.77* 2.64*  HGB 9.4* 8.9* 9.3* 8.8*  HCT 29.4* 26.6* 28.1* 27.0*  MCV 103.5*  --  101.4* 102.3*  MCH 33.1  --  33.6 33.3  MCHC 32.0  --  33.1 32.6  RDW 27.2*  --  26.6* 26.6*  PLT 149*  --  174 155   Thyroid  No results for input(s): "TSH", "FREET4" in the last 168 hours.  BNPNo results for input(s): "BNP", "PROBNP" in the last 168 hours.  DDimer No results for input(s): "DDIMER" in the last 168 hours.   Radiology    No results found.  Cardiac Studies     Patient Profile     Shawna [name] is a 83 y.o. female with a hx of   who is being seen 11/06/2023 for the evaluation of  has hx of  ESRD on hemodialysis, CAD s/p CABG 2013, DES to D1 in April 2023, HFrEF LBBB diabetes, hypertension, PAF not on anticoagulation due to history of recurrent GI bleed, GERD, history of TIA , colon CA, esophageal stricture.  Cardiology asked to see due to bradycardia and CHF at the request of Dr. Lincoln Renshaw.   Assessment & Plan    1.Afib/aflutter/atach/tachy-brady syndrome - not on anticoag due to GI bleeding history requiring transfusions.  - issues with bradycardia during Jan 2025 admission - lopressor  25mg  bid and amio 100mg  daily  were stoppeed during that admission.  -lopressor  and amio restartred during 09/2023 admission due to tachycardia. SHe was discharged on amio 400mg  bid x 7 days, then 200mg  bid x 3 weeks and metoprolol  12.5mg  bid - recurrent  issues with bradycardia this admission.    - home lopressor  stopped, oral amio lowered to 100mg  daily and then increased to 100mg  bid. On this regimen recurrent aflutter vs atach since yesterday with sustained elevated rates.   - essentially recurrent issues with both bradycardia and tachycardia over last few admissions. Currently in aflutter vs atach with sustained elvated rates since yesterday.  - patient agreeable to inpatient EP evaluation, will disucss with primary team arranging transfer   2.Anemia - long history of recurrent GI bleeds, anemia requiring transfusions - recurrent issues with bleeding GI AVMs     3. Chronic HFmrEF - fluid management per HD - medical therapy limited by low bp's in the past particularly during HD   4. ESRD    For questions or updates, please contact [facility] Please consult [email or web address] for contact info under        Signed, Armida Lander, MD  11/09/2023, 8:06 AM

## 2023-11-09 NOTE — Telephone Encounter (Signed)
 Patient still inpatient as of 11/09/2023.

## 2023-11-09 NOTE — Care Management Important Message (Signed)
 Important Message  Patient Details  Name: Shawna [name] MRN: [id number] Date of Birth: Jul 07, 1941   Important Message Given:  Yes - Medicare IM     [name] 11/09/2023, 11:47 AM

## 2023-11-09 NOTE — Plan of Care (Signed)
  Problem: Education: Goal: Knowledge of General Education information will improve Description: Including pain rating scale, medication(s)/side effects and non-pharmacologic comfort measures Outcome: Progressing   Problem: Clinical Measurements: Goal: Cardiovascular complication will be avoided Outcome: Progressing   Problem: Nutrition: Goal: Adequate nutrition will be maintained Outcome: Progressing   

## 2023-11-09 NOTE — Progress Notes (Signed)
 Mobility Specialist Progress Note:    11/09/23 1015  Mobility  Activity Transferred from bed to chair  Level of Assistance Contact guard assist, steadying assist (HHA)  Assistive Device None  Distance Ambulated (ft) 5 ft  Range of Motion/Exercises Active;All extremities  Activity Response Tolerated well  Mobility Referral Yes  Mobility visit 1 Mobility  Mobility Specialist Start Time (ACUTE ONLY) 1000  Mobility Specialist Stop Time (ACUTE ONLY) 1015  Mobility Specialist Time Calculation (min) (ACUTE ONLY) 15 min   Pt received in bed, agreeable to mobility. Required 1 person hand-held assist to stand and transfer with no AD. Tolerated well, asx throughout. Alarm on, call bell in reach. All needs met.  Glinda Lapping Mobility Specialist Please contact via Special educational needs teacher or  Rehab office at [phone]

## 2023-11-09 NOTE — Consult Note (Signed)
 ELECTROPHYSIOLOGY CONSULT NOTE    Patient ID: Shawna [name] MRN: [id number], DOB/AGE: [birth year] 83 y.o.  Admit date: 11/03/2023 Date of Consult: 11/09/2023  Primary Physician: [facility] Primary Cardiologist: Armida Lander, MD  Electrophysiologist: New   Referring Provider: Dr. Amanda Jungling  Patient Profile: Shawna [name] is a 83 y.o. female with a hx of   who is being seen 11/06/2023 for the evaluation of  has hx of  ESRD on hemodialysis, CAD s/p CABG 2013, DES to D1 in April 2023, HFrEF LBBB diabetes, hypertension, PAF not on anticoagulation due to history of recurrent GI bleed, GERD, history of TIA , colon CA, esophageal stricture.  Cardiology asked to see due to bradycardia at the request of Dr. Amanda Jungling  HPI:  Shawna [name] is a 83 y.o. female with medical history as above.   Recently has struggled with both AF/AFL RVR and bradycardia.   Gen cards saw her in January for significant bradycardia and amiodarone  and metoprolol  were stopped. She was readmitted 09/2023 with Afib with RVR and amiodarone  and metoprolol  both restarted. She was admitted 11/03/23 from dialysis for low Hgb 6.9 but in ED 8.3 hemoccult positive and bradycardic at 41 bpm. Amiodarone  and metoprolol  both stopped and recurrent Afib with RVR   Amiodarone  and lopressor  have been titrated up and down with poor overall control, so pt was transferred to [facility] for EP consultation, including consideration for PPM if appropriate.   Pt has had progressive weakness over the past 3-4 months. Still driving, walking without assistance, etc. Lives at home with her husband. Has overall fatigue and intermittent SOB. Main complaint from tachycardia is palpitations. Doesn't particularly notice if she is in AF or not.   Currently out of rhythm in the low 100s at rest without symptoms.  Has had indwelling trialysis cath > 12 months. No fistula. HD has to be paused at times due to hypotension.   Labs Potassium3.4* (05/01  0410) Magnesium   2.1 (05/01 0410) Creatinine, ser  3.08* (05/01 0410) PLT  155 (05/01 0410) HGB  8.8* (05/01 0410) WBC 4.1 (05/01 0410)  .    Allergies, Medical, Surgical, Social, and Family Histories have been reviewed and are referenced here-in when relevant for medical decision making.    Physical Exam: Vitals:   11/09/23 0500 11/09/23 0726 11/09/23 0756 11/09/23 1312  BP: (!) 110/92  106/89 119/78  Pulse:   (!) 125 (!) 118  Resp:  (!) 23 18 20   Temp:    97.6 F (36.4 C)  TempSrc:    Oral  SpO2:   97% 97%  Weight:      Height:        GEN - Elderly, no acute distress.  HEENT: Normocephalic, atraumatic Lungs- CTAB, Normal effort.  Heart- Somewhat irregular rate and rhythm.  GI- Soft, NT, ND.  Extremities- No clubbing, cyanosis, or edema   Radiology/Studies:  Echo 08/22/2023 LVEF 45-50%,grade 2 DD, mod RV dysfunction with moderately elevated PASP.    EKG: 4/25 shows sinus bradycardia in 40s (personally reviewed)  Repeat EKG today shows possibly Flutter at 115  TELEMETRY: AFL vs AT with HRS in 110-120s (personally reviewed)  Assessment/Plan:  #Paroxysmal AF #Atrial flutter #Sinus bradycardia  #Tachy-brady Not on OAC with chronic anemia and h/o GI bleeds.  Symptoms appear mostly driven by RATE and not necessarily by rhythm.  STOP amiodarone  and leave her in fib/flutter and pursue rate control, especially with inability to take OAC.  Will add back lopressor . 25 mg TID,  with 2 doses today and can gauge response in the am, and titrate either direction as needed.   Goal HR at rest <110 given her anemia and co-morbidities, may not be able to get much lower than this.   Only option for pacing would be high risk Leadless given chronic indwelling HD access, and suspect it would be unlikely to improve her overall trajectory. Has had symptomatic bradycardia in the past.  #ESRD Per primary  #Chronic anemia #H/o GI bleeding Per primary PLT  155 (05/01 0410) HGB  8.8*  (05/01 0410) Not OAC candidate  #Goals of Care DNR but otherwise want's full scope of care At this time, she is not a candidate for permanent pacing.  If her symptoms continue to be poorly managed with medications alone, will need to revisit goals. As above, leadless pacing would be high risk, without guarantee of quality of life improvement.   Dr. Marven Slimmer has seen and laid out the plan as above.   For questions or updates, please contact [facility] Please consult [email or web address] for contact info under Cardiology/STEMI.  Delorise Few, PA-C  11/09/2023 3:52 PM

## 2023-11-09 NOTE — Progress Notes (Signed)
 PROGRESS NOTE   Shawna [name]  XLK:[id number] DOB: [birth year] DOA: 11/03/2023 PCP: [facility]   Chief Complaint  Patient presents with   low hemoglobin   Level of care: Telemetry Cardiac  Brief Admission History:  83 y.o. female,  with a history of AVMs with recurrent GI bleeding, anemia, HFrEF, CAD s/p CABG on aspirin  81mg , PAF, ESRD on HD MWF, T2DM, and colon CA s/p resection 1992 patient well-known to our service with multiple presentation, hospitalization, has known history of acute on chronic blood loss anemia given history of AVMs requiring multiple admissions for transfusions.   - Presents to ED as instructed by her dialysis center for low hemoglobin as she was told at 6.9, she does report increased fatigue, dizziness, weakness, and dark stools, denies nausea, vomiting or abdominal pain, she is not on any aspirin  or anticoagulation, during recent hospitalization she was discharged on metoprolol  and amiodarone .  - In ED labs were significant for stable hemoglobin at 8.3, but she was Hemoccult positive, even though her stool color with no melena, as well she was noted bradycardic heart rate at 41, so triage hospitalist consulted to admit.    Subjective: The patient was seen and examined this morning, awake alert, following command, stable mood, only awake alert oriented to herself and place unable to tell me details of her dialysis  Hemodynamically stable, with exception of tachycardia bradycardia.  This morning tachycardic with heart rate 125 Denies any chest pain or shortness of breath      Assessment and Plan:  Severe symptomatic Sinus bradycardia -tachycardic this a.m. H/o paroxysmal A-fib with RVR -Bradycardia is most likely in the recent of recently resumed amiodarone  and metoprolol  -were discontinued on this admission   - ED discussed with cardiologist Dr. Paulita Boss who recommended to reduce amiodarone  to 100 mg and DC metoprolol     -Dr. Amanda Jungling  following .... In A-fib/flutter this morning, back on amiodarone , Lopressor  discontinued  - Not on anticoagulation secondary to recurrent GI bleed - Dr. Amanda Jungling recommended patient to be transferred to [facility] for EP evaluation today. Discussed with patient agreeable with plan     Acute metabolic encephalopathy - Improving - This morning patient is awake alert oriented x 2, pleasantly confused no signs of agitation ?  Baseline mentation  -  CT head; no acute findings -Hemodynamically stable exception of heart rate 125 this morning - STAT CXR 4/27 -- findings of pulmonary edema - HD given 4/28, 4/30>>>   - Nephrology following, next Plan hemodialysis 11/10/2023   Symptomatic anemia w  Acute on chronic blood loss anemia Anemia of chronic kidney disease - History of AVM Hemoccult positive stool - Patient with known history of anemia, history of GI bleed due to small bowel AVM, previous recommendation has been made for supportive transfusion and long acting somatostatin which she has been receiving. - Hemoglobin 7.0, transfused 1 unit PRBC with HD, Hg trending - discussed with GI, no plans for further procedures this admission - she is to take her monthly long acting octreotide  20 mg injection when she gets home, our pharmacy does not have the medication in hospital.       Uncontrolled HTN - we had to DC the metoprolol  due to severe symptomatic bradycardia - now we are dealing with very high BPs - we have started hydralazine  25 mg TID and cardiology team added imdur  30 mg daily  - pt currently does not have IV access and staff unable to obtain IV after multiple attempts  Hypokalemia - She is ESRD patient, received low-dose replacement only as needed    ESRD:  - Continue routine HD MWF per nephrology, she missed hemodialysis x 1. - HD ordered per Dr. Yvonnie Heritage - HD given 4/28, 4/30>>>     Hypothyroidism: - Continue with Synthroid    Chronic combined HFrEF, CAD, HTN, HLD: - hx CABG,  no chest pain. Last echo Feb 2025 w/LVEF 45-50%, G2DD.  - Volume management with HD. - Continue with statin - Hold beta-blocker due to bradycardia, not a candidate for any antiplatelet therapy due to recurrent GI bleed - Not a candidate for any GDMT due to low blood pressure       DVT prophylaxis: SCDs Code Status: DNR Family Communication: husband Disposition: DC home when medically stable, for now she is having uncontrolled heart rates with adjustments being made to cardiac medications   Consultants:  Nephrology GI cardiology Procedures:  Hemodialysis 11/04/23, 4/28, 4/30   Antimicrobials:      Objective: Vitals:   11/09/23 0359 11/09/23 0500 11/09/23 0726 11/09/23 0756  BP: (!) 110/92 (!) 110/92  106/89  Pulse: (!) 124   (!) 125  Resp: 16  (!) 23 18  Temp: 98.2 F (36.8 C)     TempSrc: Oral     SpO2: 95%   97%  Weight:      Height:        Intake/Output Summary (Last 24 hours) at 11/09/2023 1116 Last data filed at 11/08/2023 1449 Gross per 24 hour  Intake --  Output 600 ml  Net -600 ml   Filed Weights   11/06/23 1358 11/08/23 1120 11/08/23 1449  Weight: 50.4 kg 50.8 kg 50.2 kg   Examination:   General:  AAO x 2,  cooperative, no distress;   HEENT:  Normocephalic, PERRL, otherwise with in Normal limits   Neuro:  CNII-XII intact. , normal motor and sensation, reflexes intact   Lungs:   Clear to auscultation BL, Respirations unlabored,  No wheezes / crackles  Cardio:    S1/S2, RRR, No murmure, No Rubs or Gallops   Abdomen:  Soft, non-tender, bowel sounds active all four quadrants, no guarding or peritoneal signs.  Muscular  skeletal:  Limited exam -global generalized weaknesses - in bed, able to move all 4 extremities,   2+ pulses,  symmetric, No pitting edema  Skin:  Dry, warm to touch, negative for any Rashes,  Wounds: Please see nursing documentation       Psychiatry: Judgement and insight appear diminshed. Mood & affect appropriate.   Data  Reviewed: I have personally reviewed following labs and imaging studies  CBC: Recent Labs  Lab 11/03/23 1521 11/04/23 0458 11/05/23 0839 11/06/23 0428 11/07/23 0438 11/08/23 0459 11/09/23 0410  WBC 5.5 4.9 4.6 4.1  --  5.3 4.1  NEUTROABS 3.5  --   --   --   --   --   --   HGB 8.3* 7.0* 10.4* 9.4* 8.9* 9.3* 8.8*  HCT 25.8* 21.5* 31.0* 29.4* 26.6* 28.1* 27.0*  MCV 107.1* 103.9* 99.0 103.5*  --  101.4* 102.3*  PLT 162 178 150 149*  --  174 155    Basic Metabolic Panel: Recent Labs  Lab 11/04/23 0458 11/06/23 0428 11/07/23 0438 11/08/23 0459 11/09/23 0410  NA 136 137 133* 141 138  K 4.2 3.8 3.6 3.7 3.4*  CL 102 100 99 103 101  CO2 21* 22 23 25 25   GLUCOSE 133* 123* 98 114* 119*  BUN 40* 32* 20 38*  22  CREATININE 4.71* 4.24* 3.27* 4.76* 3.08*  CALCIUM  7.7* 8.0* 8.2* 8.5* 8.4*  MG  --   --   --  1.6* 2.1  PHOS  --  3.9 3.2 4.3 2.9    CBG: Recent Labs  Lab 11/04/23 1141 11/07/23 1116 11/07/23 1609 11/07/23 2024  GLUCAP 96 141* 148* 97    No results found for this or any previous visit (from the past 240 hours).   Radiology Studies: No results found.  Scheduled Meds:  sodium chloride    Intravenous Once   amiodarone   200 mg Oral BID   calcitRIOL   1.75 mcg Oral Q M,W,F-HD   Chlorhexidine  Gluconate Cloth  6 each Topical Q0600   Chlorhexidine  Gluconate Cloth  6 each Topical Q0600   Chlorhexidine  Gluconate Cloth  6 each Topical Q0600   cinacalcet   30 mg Oral Q M,W,F-HD   folic acid   1 mg Oral Daily   hydrALAZINE   25 mg Oral Q8H   isosorbide  mononitrate  30 mg Oral Daily   levETIRAcetam   500 mg Oral Daily   levothyroxine   50 mcg Oral Q0600   metoprolol  tartrate  2.5 mg Intravenous Once   multivitamin  1 tablet Oral Daily   potassium chloride   40 mEq Oral Once   rosuvastatin   10 mg Oral QHS   Continuous Infusions:  sodium chloride        LOS: 5 days   Time spent: 55 mins  Bobbetta Burnet, MD How to contact the [facility] Attending or Consulting provider 7A  - 7P or covering provider during after hours 7P -7A, for this patient?  Check the care team in [facility] and look for a) attending/consulting TR[facility] provider listed and b) the TRH team listed Log into [email or web address] to find provider on call.  Locate the TRH provider you are looking for under [facility] and page to a number that you can be directly reached. If you still have difficulty reaching the provider, please page the [facility] (Director on Call) for the Hospitalists listed on amion for assistance.  11/09/2023, 11:16 AM

## 2023-11-10 DIAGNOSIS — I4892 Unspecified atrial flutter: Secondary | ICD-10-CM | POA: Diagnosis not present

## 2023-11-10 DIAGNOSIS — R001 Bradycardia, unspecified: Secondary | ICD-10-CM | POA: Diagnosis not present

## 2023-11-10 DIAGNOSIS — K922 Gastrointestinal hemorrhage, unspecified: Secondary | ICD-10-CM | POA: Diagnosis not present

## 2023-11-10 LAB — RENAL FUNCTION PANEL
Albumin: 2.5 g/dL — ABNORMAL LOW (ref 3.5–5.0)
Anion gap: 12 (ref 5–15)
BUN: 32 mg/dL — ABNORMAL HIGH (ref 8–23)
CO2: 23 mmol/L (ref 22–32)
Calcium: 7.7 mg/dL — ABNORMAL LOW (ref 8.9–10.3)
Chloride: 103 mmol/L (ref 98–111)
Creatinine, Ser: 4.48 mg/dL — ABNORMAL HIGH (ref 0.44–1.00)
GFR, Estimated: 9 mL/min — ABNORMAL LOW (ref >60)
Glucose, Bld: 97 mg/dL (ref 70–99)
Phosphorus: 3.5 mg/dL (ref 2.5–4.6)
Potassium: 3.6 mmol/L (ref 3.5–5.1)
Sodium: 138 mmol/L (ref 135–145)

## 2023-11-10 LAB — CBC
HCT: 25.8 % — ABNORMAL LOW (ref 36.0–46.0)
Hemoglobin: 8.4 g/dL — ABNORMAL LOW (ref 12.0–15.0)
MCH: 33.5 pg (ref 26.0–34.0)
MCHC: 32.6 g/dL (ref 30.0–36.0)
MCV: 102.8 fL — ABNORMAL HIGH (ref 80.0–100.0)
Platelets: 160 10*3/uL (ref 150–400)
RBC: 2.51 MIL/uL — ABNORMAL LOW (ref 3.87–5.11)
RDW: 26.6 % — ABNORMAL HIGH (ref 11.5–15.5)
WBC: 4.9 10*3/uL (ref 4.0–10.5)
nRBC: 0 % (ref 0.0–0.2)

## 2023-11-10 LAB — GLUCOSE, CAPILLARY: Glucose-Capillary: 79 mg/dL (ref 70–99)

## 2023-11-10 MED ORDER — HYDRALAZINE HCL 20 MG/ML IJ SOLN
INTRAMUSCULAR | Status: AC
  Filled 2023-11-10: qty 1

## 2023-11-10 MED ORDER — METOPROLOL SUCCINATE ER 25 MG PO TB24
37.5000 mg | ORAL_TABLET | Freq: Two times a day (BID) | ORAL | Status: DC
  Administered 2023-11-10 – 2023-11-11 (×3): 37.5 mg via ORAL
  Filled 2023-11-10 (×5): qty 1

## 2023-11-10 MED ORDER — HYDROMORPHONE HCL 1 MG/ML IJ SOLN: 0.5000 mg | INTRAMUSCULAR | Status: DC

## 2023-11-10 MED ORDER — ACETAMINOPHEN 325 MG PO TABS
ORAL_TABLET | ORAL | Status: AC
Start: 2023-11-10 — End: ?
  Filled 2023-11-10: qty 3

## 2023-11-10 MED ORDER — HEPARIN SODIUM (PORCINE) 1000 UNIT/ML IJ SOLN
INTRAMUSCULAR | Status: AC
  Filled 2023-11-10: qty 4

## 2023-11-10 NOTE — Progress Notes (Signed)
 Pt receives out-pt HD at [facility] on MWF. Pt arrives at 11:00 am for 11:15 chair time. Will assist as needed.   Lauraine Polite Renal Navigator [phone]

## 2023-11-10 NOTE — Progress Notes (Signed)
 [facility] Progress Note  Name: Shawna [name] MRN: [id number] DOB: [birth year]  Chief Complaint:  Low hemoglobin, dizziness, fatigue  Subjective:  Seen in HD unit Pt just starting HD this am No c/o's   Vitals:  Vitals:   11/10/23 1109 11/10/23 1128 11/10/23 1205 11/10/23 1243  BP: (!) 124/42 (!) 124/45 (!) 132/48 (!) 136/55  Pulse: 94 63 63 62  Resp: (!) 24 17  15   Temp:    (!) 97.5 F (36.4 C)  TempSrc:    Oral  SpO2: 99% 99%  96%  Weight:      Height:         Physical Exam:     alert, nad   no jvd  Chest cta bilat  Cor reg no RG  Abd soft ntnd no ascites   Ext no LE edema   Alert, NF, ox3      L internal jugular TDC in place  OP HD: [facility]  MWF   4h  B300  53.5kg  LIJ TDC  Heparin  none Micera 200 mcg every 2 weeks - last given on 4/21 Calcitriol  1.5 mcg 3 times a week Sensipar  30 mg three times a week  Last post weight: on last wed 54.1 kg    Assessment/Plan:  Symptomatic anemia / GI bleed: hx of small bowel AVM's. Per GI/ pmd.  ESRD: HD MWF. HD today.  Acute metabolic encephalopathy: work-up per pmd, is alert today Volume: below dry wt, low UF goal w/ HD today. Looks euvolemic by exam Hypertension: getting hydralazine  and metoprolol  here. BP's up on admit, better now. Some episodes of hypotension. Watch closely.  Anemia esrd : prn transfusions per pmd. Last esa 4/21. Follow.  Secondary hyperparathyroidism: cont rocaltrol + sensipar . No binders listed, phos ok Paroxysmal Afib/ tachy-brady syndrome/ sinus bradycardia: per cardiology, rx'ing w/ metoprolol  37.5mg  bid. The amio will not be resumed since she can't take OAC.  DNR intervention   Larry Poag  MD  [facility] 11/10/2023, 1:44 PM  Recent Labs  Lab 11/09/23 0410 11/10/23 0908 11/10/23 0945  HGB 8.8*  --  8.4*  ALBUMIN  2.6* 2.5*  --   CALCIUM  8.4* 7.7*  --   PHOS 2.9 3.5  --   CREATININE 3.08* 4.48*  --   K 3.4* 3.6  --     Inpatient medications:  sodium chloride    Intravenous  Once   calcitRIOL   1.75 mcg Oral Q M,W,F-HD   Chlorhexidine  Gluconate Cloth  6 each Topical Q0600   cinacalcet   30 mg Oral Q M,W,F-HD   fluticasone   2 spray Each Nare Daily   folic acid   1 mg Oral Daily   hydrALAZINE   25 mg Oral Q8H   isosorbide  mononitrate  30 mg Oral Daily   levETIRAcetam   500 mg Oral Daily   levothyroxine   50 mcg Oral Q0600   metoprolol  succinate  37.5 mg Oral BID   multivitamin  1 tablet Oral Daily   potassium chloride   40 mEq Oral Once   rosuvastatin   10 mg Oral QHS    acetaminophen , albuterol , famotidine , hydrALAZINE , hydrOXYzine , nitroGLYCERIN 

## 2023-11-10 NOTE — Progress Notes (Signed)
 Patient complained of headache. Blood pressure readings have been 153/115, 141/127, and 167/128. Patient has been given Tylenol  650 mg  and Hydralazine  10mg  IV. Hydralazine  has been ordered for diastolic blood pressure greater than 100.

## 2023-11-10 NOTE — Telephone Encounter (Signed)
 Patient still admitted as of 11/10/23

## 2023-11-10 NOTE — Progress Notes (Addendum)
  Patient Name: Shawna [name] Date of Encounter: 11/10/2023  Primary Cardiologist: Armida Lander, MD Electrophysiologist: None  Interval Summary   The patient is doing well today on our exam.   At this time, the patient denies chest pain, shortness of breath, or any new concerns. Pending HD this am.   Vital Signs    Vitals:   11/10/23 1004 11/10/23 1027 11/10/23 1030 11/10/23 1032  BP:  (!) 167/128 (!) 157/66 (!) 167/128  Pulse: (!) 32 (!) 31 (!) 31   Resp:      Temp:      TempSrc:      SpO2: 98% 96% 100%   Weight:      Height:        Intake/Output Summary (Last 24 hours) at 11/10/2023 1057 Last data filed at 11/10/2023 0938 Gross per 24 hour  Intake 765 ml  Output --  Net 765 ml   Filed Weights   11/09/23 1601 11/10/23 0408 11/10/23 0852  Weight: 51.6 kg 50.4 kg 52.4 kg    Physical Exam    GEN- The patient is well appearing, alert and oriented x 3 today.   Lungs- Clear to ausculation bilaterally, normal work of breathing Cardiac- Regular rate and rhythm, no murmurs, rubs or gallops GI- soft, NT, ND, + BS Extremities- no clubbing or cyanosis. No edema  Telemetry    Converted to sinus bradycardia in 50s overnight (personally reviewed)  Hospital Course    Shawna [name] is a 83 y.o. female with a hx of   who is being seen 11/06/2023 for the evaluation of  has hx of  ESRD on hemodialysis, CAD s/p CABG 2013, DES to D1 in April 2023, HFrEF LBBB diabetes, hypertension, PAF not on anticoagulation due to history of recurrent GI bleed, GERD, history of TIA , colon CA, esophageal stricture.  Cardiology asked to see due to bradycardia at the request of Dr. Amanda Jungling    Assessment & Plan    #Paroxysmal AF #Atrial flutter #Sinus bradycardia  #Tachy-brady Not on OAC with chronic anemia and h/o GI bleeds.  Symptoms appear mostly driven by RATE and not necessarily by rhythm.  Would not resume amiodarone  given inability to take OAC.  Will consolidate BB to Toprol  37.5 mg BID      Only option for pacing would be high risk Leadless given chronic indwelling HD access, and suspect it would be unlikely to improve her overall trajectory.  If bradycardia recurs, will need to re-visit goals of care.   "Pulse" above shows in 30s. Went to check on pt in HD, Telemetry shows HR in 60-90s with occasional undersensing by her pulse ox.  No sustained bradycardia.    #ESRD Per primary   #Chronic anemia #H/o GI bleeding Per primary Thrombocytopenia PLT  155 (05/01 0410) HGB  8.8* (05/01 0410) Not OAC candidate   #Goals of Care DNR but otherwise want's full scope of care At this time, she is not a candidate for permanent pacing.  If her symptoms continue to be poorly managed with medications alone, will need to revisit goals. As above, leadless pacing would be high risk, without guarantee of quality of life improvement.   For questions or updates, please contact [facility] Please consult [email or web address] for contact info under Cardiology/STEMI.  Signed, Tylene Galla, PA-C  11/10/2023, 10:57 AM  pr

## 2023-11-10 NOTE — Progress Notes (Signed)
 Progress Note   Patient: Shawna [name] YNW:[id number] DOB: 26-Sep-1940 DOA: 11/03/2023     6 DOS: the patient was seen and examined on 11/10/2023   Brief hospital course: 83 y.o. female,  with a history of AVMs with recurrent GI bleeding, anemia, HFrEF, CAD s/p CABG on aspirin  81mg , PAF, ESRD on HD MWF, T2DM, and colon CA s/p resection 1992 patient well-known to our service with multiple presentation, hospitalization, has known history of acute on chronic blood loss anemia given history of AVMs requiring multiple admissions for transfusions.   - Presents to ED as instructed by her dialysis center for low hemoglobin as she was told at 6.9, she does report increased fatigue, dizziness, weakness, and dark stools, denies nausea, vomiting or abdominal pain, she is not on any aspirin  or anticoagulation, during recent hospitalization she was discharged on metoprolol  and amiodarone .  - In ED labs were significant for stable hemoglobin at 8.3, but she was Hemoccult positive, even though her stool color with no melena, as well she was noted bradycardic heart rate at 41, so triage hospitalist consulted to admit.  Assessment and Plan: Severe symptomatic Sinus bradycardia -tachycardic this a.m. H/o paroxysmal A-fib with RVR -Bradycardia is most likely in the recent of recently resumed amiodarone  and metoprolol  -Initial Cardiology recs to reduce amiodarone  to 100 mg and DC metoprolol  -EP now recommending rate control only with metoprolol  succinate 37.5mg  BID and not to restart amio -Not candidate for watchman     - Not on anticoagulation secondary to recurrent GI bleed  Acute metabolic encephalopathy - Improving - Pleasantly confused -  CT head; no acute findings -Hemodynamically stable e    Symptomatic anemia w  Acute on chronic blood loss anemia Anemia of chronic kidney disease - History of AVM Hemoccult positive stool - Patient with known history of anemia, history of GI bleed due to small bowel AVM,  previous recommendation has been made for supportive transfusion and long acting somatostatin which she has been receiving. - Hemoglobin 7.0 this visit, transfused 1 unit PRBC with HD, Hg trending - Dr. Eilene Grater discussed with GI, no plans for further procedures this admission - she is to take her monthly long acting octreotide  20 mg injection when she gets home, our pharmacy does not have the medication in hospital.    Uncontrolled HTN - On hydralazine  25 mg TID and imdur  30 mg daily with Toprol  per above   Hypokalemia - She is ESRD patient, received low-dose replacement only as needed    ESRD:  - Continue routine HD MWF per nephrology     Hypothyroidism: - Continue with Synthroid    Chronic combined HFrEF, CAD, HTN, HLD: - hx CABG, no chest pain. Last echo Feb 2025 w/LVEF 45-50%, G2DD.  - Volume management with HD. - Continue with statin   Subjective: Pleasantly confused  Physical Exam: Vitals:   11/10/23 1128 11/10/23 1205 11/10/23 1243 11/10/23 1642  BP: (!) 124/45 (!) 132/48 (!) 136/55 (!) 141/50  Pulse: 63 63 62 (!) 59  Resp: 17  15 (!) 22  Temp:   (!) 97.5 F (36.4 C) (!) 97.5 F (36.4 C)  TempSrc:   Oral Oral  SpO2: 99%  96%   Weight:      Height:       General exam: Awake, laying in bed, in nad Respiratory system: Normal respiratory effort, no wheezing Cardiovascular system: regular rate, s1, s2 Gastrointestinal system: Soft, nondistended, positive BS Central nervous system: CN2-12 grossly intact, strength intact Extremities: Perfused, no  clubbing Skin: Normal skin turgor, no notable skin lesions seen Psychiatry: Mood normal // no visual hallucinations   Data Reviewed:  Labs reviewed: Na 138, K 3.6, WBC 4.9, Plts 160  Family Communication: Pt in room, family not at bedside  Disposition: Status is: Inpatient Remains inpatient appropriate because: severity of illness  Planned Discharge Destination: Home    Author: Cherylle Corwin, MD 11/10/2023 6:53  PM  For on call review [email or web address].

## 2023-11-11 ENCOUNTER — Inpatient Hospital Stay (HOSPITAL_COMMUNITY)

## 2023-11-11 DIAGNOSIS — I480 Paroxysmal atrial fibrillation: Secondary | ICD-10-CM

## 2023-11-11 DIAGNOSIS — K922 Gastrointestinal hemorrhage, unspecified: Secondary | ICD-10-CM | POA: Diagnosis not present

## 2023-11-11 DIAGNOSIS — I4892 Unspecified atrial flutter: Secondary | ICD-10-CM | POA: Diagnosis not present

## 2023-11-11 DIAGNOSIS — R001 Bradycardia, unspecified: Secondary | ICD-10-CM | POA: Diagnosis not present

## 2023-11-11 LAB — CBC
HCT: 25.4 % — ABNORMAL LOW (ref 36.0–46.0)
Hemoglobin: 8.2 g/dL — ABNORMAL LOW (ref 12.0–15.0)
MCH: 33.6 pg (ref 26.0–34.0)
MCHC: 32.3 g/dL (ref 30.0–36.0)
MCV: 104.1 fL — ABNORMAL HIGH (ref 80.0–100.0)
Platelets: 151 10*3/uL (ref 150–400)
RBC: 2.44 MIL/uL — ABNORMAL LOW (ref 3.87–5.11)
RDW: 26.8 % — ABNORMAL HIGH (ref 11.5–15.5)
WBC: 4.1 10*3/uL (ref 4.0–10.5)
nRBC: 0 % (ref 0.0–0.2)

## 2023-11-11 LAB — GLUCOSE, CAPILLARY
Glucose-Capillary: 82 mg/dL (ref 70–99)
Glucose-Capillary: 83 mg/dL (ref 70–99)
Glucose-Capillary: 146 mg/dL — ABNORMAL HIGH (ref 70–99)

## 2023-11-11 LAB — BASIC METABOLIC PANEL WITH GFR
Anion gap: 11 (ref 5–15)
BUN: 28 mg/dL — ABNORMAL HIGH (ref 8–23)
CO2: 24 mmol/L (ref 22–32)
Calcium: 7.9 mg/dL — ABNORMAL LOW (ref 8.9–10.3)
Chloride: 100 mmol/L (ref 98–111)
Creatinine, Ser: 4.42 mg/dL — ABNORMAL HIGH (ref 0.44–1.00)
GFR, Estimated: 9 mL/min — ABNORMAL LOW (ref >60)
Glucose, Bld: 115 mg/dL — ABNORMAL HIGH (ref 70–99)
Potassium: 4 mmol/L (ref 3.5–5.1)
Sodium: 135 mmol/L (ref 135–145)

## 2023-11-11 MED ORDER — MELATONIN 5 MG PO TABS
5.0000 mg | ORAL_TABLET | Freq: Once | ORAL | Status: AC
  Administered 2023-11-12: 5 mg via ORAL
  Filled 2023-11-11: qty 1

## 2023-11-11 NOTE — Progress Notes (Signed)
   Patient Name: Shawna [name] Date of Encounter: 11/11/2023 [facility] Cardiologist: Armida Lander, MD   Interval Summary  .    Paroxysm of asymptomatic rate controlled atrial fibrillation overnight.  Back in sinus rhythm this morning.  Patient reports just generally feeling unwell, but cannot elaborate further.  She denies any chest pain, shortness of breath.  Vital Signs .    Vitals:   11/10/23 2230 11/11/23 0310 11/11/23 0311 11/11/23 0500  BP:  (!) 136/57 (!) 142/52   Pulse:   (!) 53   Resp: 20  20   Temp:   (!) 97.4 F (36.3 C)   TempSrc:   Oral   SpO2:   97%   Weight:    50.7 kg  Height:        Intake/Output Summary (Last 24 hours) at 11/11/2023 0811 Last data filed at 11/10/2023 1528 Gross per 24 hour  Intake 240 ml  Output 0.2 ml  Net 239.8 ml      11/11/2023    5:00 AM 11/10/2023    8:52 AM 11/10/2023    4:08 AM  Last 3 Weights  Weight (lbs) 111 lb 12.4 oz 115 lb 8.3 oz 111 lb 1.8 oz  Weight (kg) 50.7 kg 52.4 kg 50.4 kg      Telemetry/ECG    Rate controlled A-fib overnight, back in sinus rhythm this morning- Personally Reviewed  Physical Exam .   GEN: No acute distress.   Cardiac: Bradycardic, regular Respiratory: Clear to auscultation bilaterally. GI: Soft, nontender, non-distended  MS: No significant edema  Assessment & Plan .     Shawna [name] is a 83 y.o. female with a hx of   who is being seen 11/06/2023 for the evaluation of  has hx of  ESRD on hemodialysis, CAD s/p CABG 2013, DES to D1 in April 2023, HFrEF LBBB diabetes, hypertension, PAF not on anticoagulation due to history of recurrent GI bleed, GERD, history of TIA , colon CA, esophageal stricture.   Unfortunate situation and that she is not a candidate for anticoagulation due to recurrent GI bleeding and chronic anemia.  Given presence of ESRD and frailty, she is also not a candidate for Watchman device.  Rhythm control options are limited due to being off anticoagulation.   She  continues to have asymptomatic paroxysmal atrial fibrillation with controlled rates on metoprolol .  She appears to be tolerating metoprolol  without significant bradycardia.  #Tachycardia-bradycardia syndrome #Atrial flutter #Atrial fibrillation #Chronic anemia #H/o GI bleeding #ESRD  Plan: - Continue metoprolol  succinate 37.5 mg twice daily. - No anticoagulation or watchman as above.  EP will sign off.  For questions or updates, please contact [facility] Please consult [email or web address] for contact info under     Signed, Ardeen Kohler, MD

## 2023-11-11 NOTE — Progress Notes (Signed)
 Progress Note   Patient: Shawna [name] ZOX:[id number] DOB: 21-Sep-1940 DOA: 11/03/2023     7 DOS: the patient was seen and examined on 11/11/2023   Brief hospital course: 83 y.o. female,  with a history of AVMs with recurrent GI bleeding, anemia, HFrEF, CAD s/p CABG on aspirin  81mg , PAF, ESRD on HD MWF, T2DM, and colon CA s/p resection 1992 patient well-known to our service with multiple presentation, hospitalization, has known history of acute on chronic blood loss anemia given history of AVMs requiring multiple admissions for transfusions.   - Presents to ED as instructed by her dialysis center for low hemoglobin as she was told at 6.9, she does report increased fatigue, dizziness, weakness, and dark stools, denies nausea, vomiting or abdominal pain, she is not on any aspirin  or anticoagulation, during recent hospitalization she was discharged on metoprolol  and amiodarone .  - In ED labs were significant for stable hemoglobin at 8.3, but she was Hemoccult positive, even though her stool color with no melena, as well she was noted bradycardic heart rate at 41, so triage hospitalist consulted to admit.  Assessment and Plan: Severe symptomatic Sinus bradycardia H/o paroxysmal A-fib with RVR -Bradycardia is most likely in the recent of recently resumed amiodarone  and metoprolol  -Initial Cardiology recs to reduce amiodarone  to 100 mg and DC metoprolol  -EP now recommending rate control only with metoprolol  succinate 37.5mg  BID and not to restart amio -Not candidate for watchman    -Not on anticoagulation secondary to recurrent GI bleed -Therapy recs for SNF  Acute metabolic encephalopathy - Improving - Pleasantly confused -  CT head; no acute findings -Hemodynamically stable     Symptomatic anemia w  Acute on chronic blood loss anemia Anemia of chronic kidney disease - History of AVM Hemoccult positive stool - Patient with known history of anemia, history of GI bleed due to small bowel AVM,  previous recommendation has been made for supportive transfusion and long acting somatostatin which she has been receiving. - Hemoglobin 7.0 this visit, transfused 1 unit PRBC with HD, Hg trending - Dr. Eilene Grater discussed with GI, no plans for further procedures this admission - she is to take her monthly long acting octreotide  20 mg injection when she gets home, our pharmacy does not have the medication in hospital.    Uncontrolled HTN - On hydralazine  25 mg TID and imdur  30 mg daily with Toprol  per above   Hypokalemia - She is ESRD patient, received low-dose replacement only as needed    ESRD:  - Continue routine HD MWF per nephrology     Hypothyroidism: - Continue with Synthroid    Chronic combined HFrEF, CAD, HTN, HLD: - hx CABG, no chest pain. Last echo Feb 2025 w/LVEF 45-50%, G2DD.  - Volume management with HD. - Continue with statin   Subjective: Sleepy this AM  Physical Exam: Vitals:   11/11/23 0311 11/11/23 0500 11/11/23 0830 11/11/23 1156  BP: (!) 142/52  (!) 170/75 (!) 155/55  Pulse: (!) 53  62 (!) 55  Resp: 20  17   Temp: (!) 97.4 F (36.3 C)  97.9 F (36.6 C) 97.7 F (36.5 C)  TempSrc: Oral  Oral Oral  SpO2: 97%  96% 97%  Weight:  50.7 kg    Height:       General exam: Laying in bed, in no acute distress Respiratory system: normal chest rise, clear, no audible wheezing Cardiovascular system: regular rhythm, s1-s2 Gastrointestinal system: Nondistended, nontender, pos BS Central nervous system: No seizures, no tremors  Extremities: No cyanosis, no joint deformities Skin: No rashes, no pallor Psychiatry: difficult to assess given mentation  Data Reviewed:  Labs reviewed: Na 135, K 4.0, Cr 4.42, WBC 4.1, hgb 8.2  Family Communication: Pt in room, family not at bedside  Disposition: Status is: Inpatient Remains inpatient appropriate because: severity of illness  Planned Discharge Destination: Home    Author: Cherylle Corwin, MD 11/11/2023 6:23  PM  For on call review [email or web address].

## 2023-11-11 NOTE — Progress Notes (Addendum)
 [facility] Progress Note  Name: Shawna [name] MRN: [id number] DOB: Jul 29, 1940  Chief Complaint:  Low hemoglobin, dizziness, fatigue  Subjective:  Seen in room, more confused today Nonsensical talking   Vitals:  Vitals:   11/11/23 0310 11/11/23 0311 11/11/23 0500 11/11/23 0830  BP: (!) 136/57 (!) 142/52  (!) 170/75  Pulse:  (!) 53  62  Resp:  20  17  Temp:  (!) 97.4 F (36.3 C)  97.9 F (36.6 C)  TempSrc:  Oral  Oral  SpO2:  97%  96%  Weight:   50.7 kg   Height:         Physical Exam:     alert, nad   no jvd  Chest cta bilat  Cor reg no RG  Abd soft ntnd no ascites   Ext no LE edema   Alert, NF, Ox 1      L internal jugular TDC in place  OP HD: [facility]  MWF   4h  B300  53.5kg  LIJ TDC  Heparin  none Micera 200 mcg every 2 weeks - last given on 4/21 Calcitriol  1.5 mcg 3 times a week Sensipar  30 mg three times a week  Last post weight: on last wed 54.1 kg    Assessment/Plan:  Acute metabolic encephalopathy: work-up per pmd Symptomatic anemia / GI bleed: hx of small bowel AVM's. Per GI/ pmd.  ESRD: HD MWF. Next HD Monday.   Volume: CXR 4/27 was + for pulm edema. 1st 2 HD sessions got 4.5 L off in total. Last 2 sessions 600cc and 200cc off. Currently looks euvolemic by exam, no vol excess. Not eating much d/t confusion. On RA. Get f/u CXR. Under dry wt.   Hypertension: getting hydralazine  + metoprolol . BP's up on admit, better now. Some episodes of hypotension.  Anemia esrd : prn transfusions per pmd. Last esa 4/21. Follow.  Secondary hyperparathyroidism: cont rocaltrol + sensipar . No binders listed, phos ok Paroxysmal Afib/ tachy-brady syndrome/ sinus bradycardia: per cardiology -> rx'ing w/ metoprolol  37.5mg  bid. The amio will not be resumed since she can't take OAC.  DNR intervention   Larry Poag  MD  [facility] 11/11/2023, 10:16 AM  Recent Labs  Lab 11/09/23 0410 11/10/23 0908 11/10/23 0945  HGB 8.8*  --  8.4*  ALBUMIN  2.6* 2.5*  --   CALCIUM   8.4* 7.7*  --   PHOS 2.9 3.5  --   CREATININE 3.08* 4.48*  --   K 3.4* 3.6  --     Inpatient medications:  sodium chloride    Intravenous Once   calcitRIOL   1.75 mcg Oral Q M,W,F-HD   Chlorhexidine  Gluconate Cloth  6 each Topical Q0600   cinacalcet   30 mg Oral Q M,W,F-HD   folic acid   1 mg Oral Daily   hydrALAZINE   25 mg Oral Q8H   isosorbide  mononitrate  30 mg Oral Daily   levETIRAcetam   500 mg Oral Daily   levothyroxine   50 mcg Oral Q0600   metoprolol  succinate  37.5 mg Oral BID   multivitamin  1 tablet Oral Daily   potassium chloride   40 mEq Oral Once   rosuvastatin   10 mg Oral QHS    acetaminophen , albuterol , famotidine , hydrALAZINE , hydrOXYzine , nitroGLYCERIN 

## 2023-11-11 NOTE — Evaluation (Signed)
 Physical Therapy Evaluation Patient Details Name: Shawna [name] MRN: [id number] DOB: 02-28-1941 Today's Date: 11/11/2023  History of Present Illness  Pt is a 83 y.o. F who presents from dialysis center for low hemoglobin. In ED, labs were significant for stable hemoglobin at 8.3, but she was Hemoccult positive, even though her stool color with no melena, as well she was noted bradycardic heart rate at 41, so triage hospitalist consulted to admit.PMH ESRD on HD MWF, seizure disorder, afib, HTN, HLD, CAD s/p CABG and PCI, hypothyroidism, , Small bowel AVMS, GIB, Hx of colon CA, TIA, PUD, Gout.  Clinical Impression  Pt admitted with above. Pt reports general malaise, repeatedly stating, "I don't feel good," and "Jesus." Pt reports abdomen pain, but when asked to point to location, points to more substernal area. Upon sitting up on edge of bed, pt with + nausea and dry heaving. HR 57-80, BP 160/91 sitting EOB. RN notified and present and assisted with repositioning back in bed. Deferred transfer to chair due to above symptoms. Pt also is acutely confused and A&Ox1. Will continue to assess.         If plan is discharge home, recommend the following: A little help with walking and/or transfers;A little help with bathing/dressing/bathroom;Assistance with cooking/housework;Assist for transportation;Help with stairs or ramp for entrance;Direct supervision/assist for medications management;Direct supervision/assist for financial management   Can travel by private vehicle        Equipment Recommendations None recommended by PT  Recommendations for Other Services       Functional Status Assessment Patient has had a recent decline in their functional status and demonstrates the ability to make significant improvements in function in a reasonable and predictable amount of time.     Precautions / Restrictions Precautions Precautions: Fall Recall of Precautions/Restrictions: Impaired Restrictions Weight  Bearing Restrictions Per Provider Order: No      Mobility  Bed Mobility Overal bed mobility: Needs Assistance Bed Mobility: Supine to Sit, Sit to Supine     Supine to sit: Supervision Sit to supine: Min assist   General bed mobility comments: Supervision to transition to edge of bed with HOB elevated. MinA for LE assist back into bed    Transfers                   General transfer comment: deferred due to nausea    Ambulation/Gait                  Stairs            Wheelchair Mobility     Tilt Bed    Modified Rankin (Stroke Patients Only)       Balance Overall balance assessment: Needs assistance Sitting-balance support: Feet supported Sitting balance-Leahy Scale: Good                                       Pertinent Vitals/Pain Pain Assessment Pain Assessment: Faces Faces Pain Scale: Hurts even more Pain Location: abdomen/sub sternal Pain Descriptors / Indicators: Grimacing, Guarding, Discomfort Pain Intervention(s): Limited activity within patient's tolerance, Monitored during session, Other (comment) (RN notified)    Home Living Family/patient expects to be discharged to:: Private residence Living Arrangements: Spouse/significant other Available Help at Discharge: Family;Available 24 hours/day Type of Home: House Home Access: Stairs to enter Entrance Stairs-Rails: Right Entrance Stairs-Number of Steps: 2   Home Layout: One level Home Equipment: Rollator (  4 wheels);Cane - single point;Shower seat;BSC/3in1;Rolling Environmental consultant (2 wheels);Cane - quad      Prior Function Prior Level of Function : Needs assist             Mobility Comments: use of cane vs walker for mobility depending on strength on a given day ADLs Comments: Modified Independent with ADLs. Reports husband manages cooking, cleaning, transporation needs     Extremity/Trunk Assessment   Upper Extremity Assessment Upper Extremity Assessment: Defer  to OT evaluation    Lower Extremity Assessment Lower Extremity Assessment: Generalized weakness    Cervical / Trunk Assessment Cervical / Trunk Assessment: Kyphotic  Communication   Communication Communication: No apparent difficulties    Cognition Arousal: Alert Behavior During Therapy: Flat affect   PT - Cognitive impairments: No family/caregiver present to determine baseline, Orientation   Orientation impairments: Place, Time, Situation                   PT - Cognition Comments: Pt reports month is July; able to correctly guess city when given options, but not oriented to place. Following commands: Intact       Cueing Cueing Techniques: Verbal cues     General Comments      Exercises     Assessment/Plan    PT Assessment Patient needs continued PT services  PT Problem List Decreased strength;Decreased activity tolerance;Decreased balance;Decreased mobility;Decreased cognition;Pain       PT Treatment Interventions DME instruction;Gait training;Functional mobility training;Therapeutic activities;Therapeutic exercise;Balance training;Patient/family education;Stair training    PT Goals (Current goals can be found in the Care Plan section)  Acute Rehab PT Goals Patient Stated Goal: to feel better PT Goal Formulation: With patient Time For Goal Achievement: 11/25/23 Potential to Achieve Goals: Good    Frequency Min 2X/week     Co-evaluation               AM-PAC PT "6 Clicks" Mobility  Outcome Measure Help needed turning from your back to your side while in a flat bed without using bedrails?: A Little Help needed moving from lying on your back to sitting on the side of a flat bed without using bedrails?: A Little Help needed moving to and from a bed to a chair (including a wheelchair)?: A Little Help needed standing up from a chair using your arms (e.g., wheelchair or bedside chair)?: A Little Help needed to walk in hospital room?: A Little Help  needed climbing 3-5 steps with a railing? : A Lot 6 Click Score: 17    End of Session   Activity Tolerance: Other (comment) (limited by malaise/nausea) Patient left: in bed;with call bell/phone within reach;with bed alarm set Nurse Communication: Mobility status PT Visit Diagnosis: Muscle weakness (generalized) (M62.81)    Time: 0347-4259 PT Time Calculation (min) (ACUTE ONLY): 23 min   Charges:   PT Evaluation $PT Eval Low Complexity: 1 Low PT Treatments $Therapeutic Activity: 8-22 mins PT General Charges $$ ACUTE PT VISIT: 1 Visit         Verdia Glad, PT, DPT Acute Rehabilitation Services Office [phone]   Claria Crofts 11/11/2023, 9:15 AM

## 2023-11-11 NOTE — Evaluation (Addendum)
 Occupational Therapy Evaluation Patient Details Name: Shawna [name] MRN: [id number] DOB: [birth year] Today's Date: 11/11/2023   History of Present Illness   Pt is a 83 y.o. F who presents from dialysis center for low hemoglobin. In ED, labs were significant for stable hemoglobin at 8.3, but she was Hemoccult positive, even though her stool color with no melena, as well she was noted bradycardic heart rate at 41, so triage hospitalist consulted to admit.PMH ESRD on HD MWF, seizure disorder, afib, HTN, HLD, CAD s/p CABG and PCI, hypothyroidism, , Small bowel AVMS, GIB, Hx of colon CA, TIA, PUD, Gout.     Clinical Impressions Pt presents with decline in function and safety with ADLs and ADL mobility with impaired strength, balance and endurance. Pt with some confusion, states her name and that she is in hospital but unable to recall month, year or why she is in the hospital. PTA pt lives with her husband and she was Ind with ADLs/selfcare, used a cane or RW for mobility, husband assist with home mgt, cooking, does the driving. Pt currently requires max A with LB ADLs, CGA with UB ADLs, total A with toileting at bed level. Pt sat EOB with Sup and extra time, but adamantly declined standing/commode transfers at this time, stating, "I don't feel good, leave me alone".  Pt required min A with LEs to return to supine in bed. OT will follow acutely to maximize level of function and safety BP sitting EOB 153/60, HR 59     If plan is discharge home, recommend the following:   A little help with walking and/or transfers;A little help with bathing/dressing/bathroom;Assistance with cooking/housework;Assist for transportation;Help with stairs or ramp for entrance;Direct supervision/assist for medications management;Direct supervision/assist for financial management      Functional Status Assessment   Patient has had a recent decline in their functional status and demonstrates the ability to make significant  improvements in function in a reasonable and predictable amount of time.     Equipment Recommendations   Other (comment) (defer)     Recommendations for Other Services         Precautions/Restrictions   Precautions Precautions: Fall Recall of Precautions/Restrictions: Impaired Restrictions Weight Bearing Restrictions Per Provider Order: No     Mobility Bed Mobility Overal bed mobility: Needs Assistance Bed Mobility: Supine to Sit, Sit to Supine     Supine to sit: Supervision Sit to supine: Min assist   General bed mobility comments: min A with LE mgt back to bed    Transfers                   General transfer comment: pt adamantly declined      Balance Overall balance assessment: Needs assistance Sitting-balance support: Feet supported Sitting balance-Leahy Scale: Good                                     ADL either performed or assessed with clinical judgement   ADL Overall ADL's : Needs assistance/impaired Eating/Feeding: Supervision/ safety;Sitting   Grooming: Wash/dry hands;Wash/dry face;Contact guard assist;Sitting   Upper Body Bathing: Contact guard assist;Sitting   Lower Body Bathing: Maximal assistance   Upper Body Dressing : Contact guard assist;Sitting   Lower Body Dressing: Maximal assistance     Toilet Transfer Details (indicate cue type and reason): declined Toileting- Clothing Manipulation and Hygiene: Total assistance;Bed level  General ADL Comments: pt required max encouragement to sit EOB for activity, adamanlty declined OOB/SPTs     Vision Ability to See in Adequate Light: 0 Adequate Patient Visual Report: No change from baseline       Perception         Praxis         Pertinent Vitals/Pain Pain Assessment Pain Assessment: Faces Faces Pain Scale: Hurts little more Pain Location: abdomen/sub sternal Pain Descriptors / Indicators: Grimacing, Guarding, Discomfort, Restless Pain  Intervention(s): Monitored during session, Limited activity within patient's tolerance, Repositioned     Extremity/Trunk Assessment Upper Extremity Assessment Upper Extremity Assessment: Generalized weakness   Lower Extremity Assessment Lower Extremity Assessment: Defer to PT evaluation   Cervical / Trunk Assessment Cervical / Trunk Assessment: Kyphotic   Communication Communication Communication: No apparent difficulties   Cognition Arousal: Alert Behavior During Therapy: Flat affect, Restless                                 Following commands: Intact       Cueing  General Comments   Cueing Techniques: Verbal cues      Exercises     Shoulder Instructions      Home Living Family/patient expects to be discharged to:: Private residence Living Arrangements: Spouse/significant other Available Help at Discharge: Family;Available 24 hours/day Type of Home: House Home Access: Stairs to enter [employer] of Steps: 2 Entrance Stairs-Rails: Right Home Layout: One level     Bathroom Shower/Tub: Chief Strategy Officer: Standard Bathroom Accessibility: Yes   Home Equipment: Rollator (4 wheels);Cane - single point;Shower seat;BSC/3in1;Rolling Environmental consultant (2 wheels);Cane - quad          Prior Functioning/Environment Prior Level of Function : Needs assist             Mobility Comments: reports she uses cane and RW depending on hoe strong she feels day to day ADLs Comments: reports Ind with ADLs, husband cooks, cleans, provides transportation    OT Problem List: Decreased strength;Impaired balance (sitting and/or standing);Decreased cognition;Decreased activity tolerance;Decreased knowledge of use of DME or AE;Pain   OT Treatment/Interventions: Self-care/ADL training;Therapeutic activities;DME and/or AE instruction;Patient/family education;Therapeutic exercise;Balance training      OT Goals(Current goals can be found in the care  plan section)   Acute Rehab OT Goals Patient Stated Goal: none stated OT Goal Formulation: With patient Time For Goal Achievement: 11/25/23 Potential to Achieve Goals: Good ADL Goals Pt Will Perform Grooming: with contact guard assist;standing Pt Will Perform Upper Body Bathing: with supervision;sitting Pt Will Perform Lower Body Bathing: with mod assist Pt Will Perform Upper Body Dressing: with supervision;sitting Pt Will Transfer to Toilet: with mod assist;stand pivot transfer;bedside commode Pt Will Perform Toileting - Clothing Manipulation and hygiene: with max assist;with mod assist;sitting/lateral leans;sit to/from stand   OT Frequency:  Min 2X/week    Co-evaluation              AM-PAC OT "6 Clicks" Daily Activity     Outcome Measure Help from another person eating meals?: None Help from another person taking care of personal grooming?: A Little Help from another person toileting, which includes using toliet, bedpan, or urinal?: Total Help from another person bathing (including washing, rinsing, drying)?: A Lot Help from another person to put on and taking off regular upper body clothing?: A Lot Help from another person to put on and taking off regular lower body  clothing?: Total 6 Click Score: 13   End of Session    Activity Tolerance: Patient limited by fatigue;Patient limited by pain Patient left: in bed;with call bell/phone within reach;with bed alarm set  OT Visit Diagnosis: Other abnormalities of gait and mobility (R26.89);Muscle weakness (generalized) (M62.81);Pain                Time: 1126-1150 OT Time Calculation (min): 24 min Charges:  OT General Charges $OT Visit: 1 Visit OT Evaluation $OT Eval Moderate Complexity: 1 Mod OT Treatments $Therapeutic Activity: 8-22 mins    Alfred Ann 11/11/2023, 1:55 PM

## 2023-11-11 NOTE — Plan of Care (Signed)

## 2023-11-11 NOTE — Progress Notes (Signed)
 Patient ID: Shawna [name], female   DOB: 06-23-1941, 83 y.o.   MRN: [id number]   Attempted to change dressing on central line x 3. Patient refused. On bedside commode, eating dinner, and visiting with family. Asked patient to let me now when she was back in bed. Will let night shift know dressing change is due before midnight.  Genella Kendall, RN

## 2023-11-12 DIAGNOSIS — I4892 Unspecified atrial flutter: Secondary | ICD-10-CM | POA: Diagnosis not present

## 2023-11-12 DIAGNOSIS — R001 Bradycardia, unspecified: Secondary | ICD-10-CM | POA: Diagnosis not present

## 2023-11-12 DIAGNOSIS — K922 Gastrointestinal hemorrhage, unspecified: Secondary | ICD-10-CM | POA: Diagnosis not present

## 2023-11-12 LAB — GLUCOSE, CAPILLARY: Glucose-Capillary: 101 mg/dL — ABNORMAL HIGH (ref 70–99)

## 2023-11-12 LAB — CBC
HCT: 25.3 % — ABNORMAL LOW (ref 36.0–46.0)
Hemoglobin: 8.5 g/dL — ABNORMAL LOW (ref 12.0–15.0)
MCH: 33.9 pg (ref 26.0–34.0)
MCHC: 33.6 g/dL (ref 30.0–36.0)
MCV: 100.8 fL — ABNORMAL HIGH (ref 80.0–100.0)
Platelets: 158 10*3/uL (ref 150–400)
RBC: 2.51 MIL/uL — ABNORMAL LOW (ref 3.87–5.11)
RDW: 26.6 % — ABNORMAL HIGH (ref 11.5–15.5)
WBC: 4.5 10*3/uL (ref 4.0–10.5)
nRBC: 0 % (ref 0.0–0.2)

## 2023-11-12 LAB — BASIC METABOLIC PANEL WITH GFR
Anion gap: 11 (ref 5–15)
BUN: 37 mg/dL — ABNORMAL HIGH (ref 8–23)
CO2: 24 mmol/L (ref 22–32)
Calcium: 7.8 mg/dL — ABNORMAL LOW (ref 8.9–10.3)
Chloride: 99 mmol/L (ref 98–111)
Creatinine, Ser: 5.1 mg/dL — ABNORMAL HIGH (ref 0.44–1.00)
GFR, Estimated: 8 mL/min — ABNORMAL LOW (ref >60)
Glucose, Bld: 97 mg/dL (ref 70–99)
Potassium: 4.3 mmol/L (ref 3.5–5.1)
Sodium: 134 mmol/L — ABNORMAL LOW (ref 135–145)

## 2023-11-12 MED ORDER — METOPROLOL SUCCINATE ER 25 MG PO TB24
37.5000 mg | ORAL_TABLET | Freq: Two times a day (BID) | ORAL | Status: DC
  Administered 2023-11-14: 37.5 mg via ORAL
  Filled 2023-11-12 (×2): qty 2

## 2023-11-12 MED ORDER — CHLORHEXIDINE GLUCONATE CLOTH 2 % EX PADS
6.0000 | MEDICATED_PAD | Freq: Every day | CUTANEOUS | Status: DC
  Administered 2023-11-13: 6 via TOPICAL

## 2023-11-12 MED ORDER — DARBEPOETIN ALFA 200 MCG/0.4ML IJ SOSY
200.0000 ug | PREFILLED_SYRINGE | INTRAMUSCULAR | Status: DC
  Administered 2023-11-12: 200 ug via SUBCUTANEOUS
  Filled 2023-11-12: qty 0.4

## 2023-11-12 NOTE — Progress Notes (Signed)
 Pt complained of 4/10 chest pressure. I gave Pt 1 nitroglycerin  SL tablet 0.4 mg at 2145 with BP at 154/74. Waited the 5 minutes and Pt said pressure and discomfort was gone. Pt BP at 2155 was 162/72. Informed Dr. Del Favia who said if happens again to get an EKG. Pt has not had any CP or chest pressure since. Will continue to monitor.

## 2023-11-12 NOTE — Progress Notes (Signed)
 [facility] Progress Note  Name: Shawna [name] MRN: [id number] DOB: 11/02/1940  Subjective:  Seen in room, remains confused   Vitals:  Vitals:   11/12/23 0420 11/12/23 0457 11/12/23 0629 11/12/23 1003  BP: 138/73  (!) 158/72 (!) 150/74  Pulse: (!) 50     Resp: (!) 21   16  Temp: 98 F (36.7 C)     TempSrc: Oral   Oral  SpO2: 100%   96%  Weight:  50.3 kg    Height:         Physical Exam:     alert, nad   no jvd  Chest cta bilat  Cor reg no RG  Abd soft ntnd no ascites   Ext no LE edema   Alert, NF, Ox 1      L internal jugular TDC in place  OP HD: [facility]  MWF   4h  B300  53.5kg  [facility] TDC  Heparin  none Micera 200 mcg every 2 weeks - last given on 4/21 Calcitriol  1.5 mcg 3 times a week Sensipar  30 mg three times a week  Last post weight: on last wed 54.1 kg    Assessment/Plan:  Tachy-brady syndrome w/ severe sinus bradycardia/ hx of PAF: per cardiology -> rx'ing w/ metoprolol  37.5mg  bid. The amio will not be resumed since she can't take OAC.  Acute metabolic encephalopathy: improving Symptomatic anemia / GI bleed: hx of small bowel AVM's. Per GI/ pmd.  ESRD: HD MWF. Next HD Monday.   Volume: CXR 4/27 was + for pulm edema. 1st 2 HD sessions got 4.5 L off in total. Last 2 sessions 600cc and 200cc off. No vol excess on exam, on RA. CXR 5/03 still doesn't look great. UF 2.5 L w/ next HD as tol. Lower dry wt at d/c.  Hypertension: getting hydralazine  + metoprolol . BP's up on admit, better now. Some episodes of hypotension.  Anemia esrd : prn transfusions per pmd. Last esa 4/21. Hb 8-9 after 2 wks in hospital. Will cont esa here w/ darbe 200 mcg weekly.   Secondary hyperparathyroidism: cont rocaltrol + sensipar . No binders listed, phos ok DNR intervention   Larry Poag  MD  [facility] 11/12/2023, 11:35 AM  Recent Labs  Lab 11/09/23 0410 11/10/23 0908 11/10/23 0945 11/11/23 1538 11/12/23 0407  HGB 8.8*  --    < > 8.2* 8.5*  ALBUMIN  2.6* 2.5*  --   --   --    CALCIUM  8.4* 7.7*  --  7.9* 7.8*  PHOS 2.9 3.5  --   --   --   CREATININE 3.08* 4.48*  --  4.42* 5.10*  K 3.4* 3.6  --  4.0 4.3   < > = values in this interval not displayed.    Inpatient medications:  sodium chloride    Intravenous Once   calcitRIOL   1.75 mcg Oral Q M,W,F-HD   Chlorhexidine  Gluconate Cloth  6 each Topical Q0600   cinacalcet   30 mg Oral Q M,W,F-HD   folic acid   1 mg Oral Daily   hydrALAZINE   25 mg Oral Q8H   isosorbide  mononitrate  30 mg Oral Daily   levETIRAcetam   500 mg Oral Daily   levothyroxine   50 mcg Oral Q0600   metoprolol  succinate  37.5 mg Oral BID   multivitamin  1 tablet Oral Daily   potassium chloride   40 mEq Oral Once   rosuvastatin   10 mg Oral QHS    acetaminophen , albuterol , famotidine , hydrALAZINE , hydrOXYzine , nitroGLYCERIN 

## 2023-11-12 NOTE — Progress Notes (Signed)
 Progress Note   Patient: Shawna [name] WJX:[id number] DOB: 07/24/1940 DOA: 11/03/2023     8 DOS: the patient was seen and examined on 11/12/2023   Brief hospital course: 83 y.o. female,  with a history of AVMs with recurrent GI bleeding, anemia, HFrEF, CAD s/p CABG on aspirin  81mg , PAF, ESRD on HD MWF, T2DM, and colon CA s/p resection 1992 patient well-known to our service with multiple presentation, hospitalization, has known history of acute on chronic blood loss anemia given history of AVMs requiring multiple admissions for transfusions.   - Presents to ED as instructed by her dialysis center for low hemoglobin as she was told at 6.9, she does report increased fatigue, dizziness, weakness, and dark stools, denies nausea, vomiting or abdominal pain, she is not on any aspirin  or anticoagulation, during recent hospitalization she was discharged on metoprolol  and amiodarone .  - In ED labs were significant for stable hemoglobin at 8.3, but she was Hemoccult positive, even though her stool color with no melena, as well she was noted bradycardic heart rate at 41, so triage hospitalist consulted to admit.  Assessment and Plan: Severe symptomatic Sinus bradycardia H/o paroxysmal A-fib with RVR -Bradycardia is most likely in the recent of recently resumed amiodarone  and metoprolol  -Initial Cardiology recs to reduce amiodarone  to 100 mg and DC metoprolol  -EP now recommending rate control only with metoprolol  succinate 37.5mg  BID and not to restart amio -Not candidate for watchman    -Not on anticoagulation secondary to recurrent GI bleed -OT had recommended SNF, TOC consulted  Acute metabolic encephalopathy - Improving - Pleasantly confused -  CT head; no acute findings -Hemodynamically stable     Symptomatic anemia w  Acute on chronic blood loss anemia Anemia of chronic kidney disease - History of AVM Hemoccult positive stool - Patient with known history of anemia, history of GI bleed due to small  bowel AVM, previous recommendation has been made for supportive transfusion and long acting somatostatin which she has been receiving. - Hemoglobin 7.0 this visit, transfused 1 unit PRBC with HD, Hg trending - Dr. Eilene Grater discussed with GI, no plans for further procedures this admission - she is to take her monthly long acting octreotide  20 mg injection when she gets home, our pharmacy does not have the medication in hospital.    Uncontrolled HTN - On hydralazine  25 mg TID and imdur  30 mg daily with Toprol  per above   Hypokalemia - She is ESRD patient, received low-dose replacement only as needed    ESRD:  - Continue routine HD MWF per nephrology     Hypothyroidism: - Continue with Synthroid    Chronic combined HFrEF, CAD, HTN, HLD: - hx CABG, no chest pain. Last echo Feb 2025 w/LVEF 45-50%, G2DD.  - Volume management with HD. - Continue with statin   Subjective: Without complaints this AM. Staff reports pt slept well overnight  Physical Exam: Vitals:   11/12/23 0629 11/12/23 1003 11/12/23 1417 11/12/23 1806  BP: (!) 158/72 (!) 150/74 (!) 154/52 (!) 143/55  Pulse:   (!) 50 (!) 51  Resp:  16    Temp:   (!) 97.5 F (36.4 C)   TempSrc:  Oral Oral Oral  SpO2:  96% 98%   Weight:      Height:       General exam: Awake, sitting in bed eating breakfast, in nad Respiratory system: Normal respiratory effort, no wheezing Cardiovascular system: regular rate, s1, s2 Gastrointestinal system: Soft, nondistended, positive BS Central nervous system: CN2-12  grossly intact, strength intact Extremities: Perfused, no clubbing Skin: Normal skin turgor, no notable skin lesions seen Psychiatry: Mood normal // affect appears normal  Data Reviewed:  Labs reviewed: Na 134, K 4.3, Cr 5.10, WBC 4.5, Hgb 8.5, Plts 158  Family Communication: Pt in room, family not at bedside  Disposition: Status is: Inpatient Remains inpatient appropriate because: severity of illness  Planned Discharge  Destination: Skilled nursing facility    Author: Cherylle Corwin, MD 11/12/2023 7:24 PM  For on call review [email or web address].

## 2023-11-12 NOTE — Progress Notes (Addendum)
 Mobility Specialist Progress Note:   11/12/23 0925  Mobility  Activity Dangled on edge of bed  Level of Assistance Moderate assist, patient does 50-74%  Distance Ambulated (ft)  (pt unable to stay awake on EOB, unsafe to attempt transfer.)  Activity Response Tolerated fair (lethargic (Per NT, pt attempting to get OOB all night))  Mobility Referral Yes  Mobility visit 1 Mobility  Mobility Specialist Start Time (ACUTE ONLY) [id number]  Mobility Specialist Stop Time (ACUTE ONLY) 0940  Mobility Specialist Time Calculation (min) (ACUTE ONLY) 15 min   Pt agreeable to mobility session, difficult to arouse. Able to come to EOB with modA, before dozing off while sitting up. Pt unable to follow any commands. Further mobility deferred until incr arousal level. Back in bed with all needs met, alarm on. HR 40s throughout session.  Oneda Big Mobility Specialist Please contact via SecureChat or  Rehab office at [phone]

## 2023-11-13 DIAGNOSIS — K922 Gastrointestinal hemorrhage, unspecified: Secondary | ICD-10-CM | POA: Diagnosis not present

## 2023-11-13 DIAGNOSIS — R001 Bradycardia, unspecified: Secondary | ICD-10-CM | POA: Diagnosis not present

## 2023-11-13 DIAGNOSIS — I4892 Unspecified atrial flutter: Secondary | ICD-10-CM | POA: Diagnosis not present

## 2023-11-13 LAB — BASIC METABOLIC PANEL WITH GFR
Anion gap: 10 (ref 5–15)
BUN: 50 mg/dL — ABNORMAL HIGH (ref 8–23)
CO2: 24 mmol/L (ref 22–32)
Calcium: 7.9 mg/dL — ABNORMAL LOW (ref 8.9–10.3)
Chloride: 101 mmol/L (ref 98–111)
Creatinine, Ser: 6.38 mg/dL — ABNORMAL HIGH (ref 0.44–1.00)
GFR, Estimated: 6 mL/min — ABNORMAL LOW (ref >60)
Glucose, Bld: 159 mg/dL — ABNORMAL HIGH (ref 70–99)
Potassium: 4 mmol/L (ref 3.5–5.1)
Sodium: 135 mmol/L (ref 135–145)

## 2023-11-13 LAB — CBC
HCT: 23.6 % — ABNORMAL LOW (ref 36.0–46.0)
Hemoglobin: 7.9 g/dL — ABNORMAL LOW (ref 12.0–15.0)
MCH: 34.2 pg — ABNORMAL HIGH (ref 26.0–34.0)
MCHC: 33.5 g/dL (ref 30.0–36.0)
MCV: 102.2 fL — ABNORMAL HIGH (ref 80.0–100.0)
Platelets: 145 10*3/uL — ABNORMAL LOW (ref 150–400)
RBC: 2.31 MIL/uL — ABNORMAL LOW (ref 3.87–5.11)
RDW: 26.9 % — ABNORMAL HIGH (ref 11.5–15.5)
WBC: 3.9 10*3/uL — ABNORMAL LOW (ref 4.0–10.5)
nRBC: 0 % (ref 0.0–0.2)

## 2023-11-13 MED ORDER — HEPARIN SODIUM (PORCINE) 1000 UNIT/ML IJ SOLN
INTRAMUSCULAR | Status: AC
  Filled 2023-11-13: qty 4

## 2023-11-13 MED ORDER — HEPARIN SODIUM (PORCINE) 1000 UNIT/ML DIALYSIS
1000.0000 [IU] | INTRAMUSCULAR | Status: DC | PRN
  Administered 2023-11-13: 1000 [IU]

## 2023-11-13 MED ORDER — CAMPHOR-MENTHOL 0.5-0.5 % EX LOTN
1.0000 | TOPICAL_LOTION | CUTANEOUS | Status: DC | PRN
  Administered 2023-11-13: 1 via TOPICAL
  Filled 2023-11-13: qty 222

## 2023-11-13 NOTE — Progress Notes (Signed)
 PT Cancellation Note  Patient Details Name: Shawna [name] MRN: [id number] DOB: 02/25/1941   Cancelled Treatment:    Reason Eval/Treat Not Completed: (P) Patient at procedure or test/unavailable Pt is off the floor for HD. PT will follow back for treatment this afternoon as able.  [name] B. [name] PT, DPT Acute Rehabilitation Services Please use secure chat or  Call Office [phone]    [name] [facility] 11/13/2023, 8:23 AM

## 2023-11-13 NOTE — Progress Notes (Signed)
 PT Cancellation Note  Patient Details Name: Shawna [name] MRN: [id number] DOB: February 02, 1941   Cancelled Treatment:    Reason Eval/Treat Not Completed: (P) Patient declined, no reason specified Pt is very agitated on entry, ripping of her leads. RN in room and attempts to have son talk to her on the phone. Pt refuses to talk to him. PT attempts to have pt move from recliner back to bed. Pt refuses. PT eventually able to get pt feet back in recliner. Pt asleep with chair alarm on when PT left the room. PT will follow back in the morning to assess mobility however given level of confusion PT believes if she moves well enough pt will do better in her home environment.   Chasyn Cinque B. [name] PT, DPT Acute Rehabilitation Services Please use secure chat or  Call Office [phone]   Nellie Banas B. [name] PT, DPT Acute Rehabilitation Services Please use secure chat or  Call Office [phone]    [name] [facility] 11/13/2023, 3:44 PM

## 2023-11-13 NOTE — Progress Notes (Signed)
 Pt complains of Itching-already received PRN Atarax  at 0200, Provider on call paged for Sarna lotion.

## 2023-11-13 NOTE — TOC Initial Note (Signed)
 Transition of Care [facility]) - Initial/Assessment Note    Patient Details  Name: Shawna [name] MRN: [id number] Date of Birth: [birth year]  Transition of Care [facility]) CM/SW Contact:    Cosimo Diones, RN Phone Number: 11/13/2023, 4:40 PM  Clinical Narrative:  Patient transferred from [facility] on 11-13-23. Patient presented for low hemoglobin. Hx ESRD HD MWF [facility]. Patient is currently active with [facility] [place] [phone] and fax is [phone]. Case Manager reached out to [facility] for disciplines-awaiting call back. PTA patient was from home with spouse. Plan will be to return home once stable. Case Manager will continue to follow for additional transition of care needs.                Expected Discharge Plan: Home w Home Health Services Barriers to Discharge: Continued Medical Work up   Patient Goals and CMS Choice Patient states their goals for this hospitalization and ongoing recovery are:: plan to return home. CMS Medicare.gov Compare Post Acute Care list provided to:: Patient Choice offered to / list presented to : Spouse  ownership interest in [facility].provided to::  (n/a)    Expected Discharge Plan and Services In-house Referral: NA Discharge Planning Services: CM Consult Post Acute Care Choice: Resumption of Svcs/PTA Provider, Home Health Living arrangements for the past 2 months: Single Family Home Expected Discharge Date: 11/05/23                   HH Arranged: RN, Disease Management, PT HH Agency: [facility] Date [facility] Agency Contacted: 11/13/23 Time HH Agency Contacted: [phone] Representative spoke with at [facility] Agency: Candice Chalet  Prior Living Arrangements/Services Living arrangements for the past 2 months: Single Family Home Lives with:: Spouse Patient language and need for interpreter reviewed:: Yes Do you feel safe going back to the place where you live?: Yes       Need for Family Participation in Patient Care: Yes (Comment) Care giver support system in place?: Yes (comment) Current home services: DME Criminal Activity/Legal Involvement Pertinent to Current Situation/Hospitalization: No - Comment as needed  Activities of Daily Living   ADL Screening (condition at time of admission) Independently performs ADLs?: Yes (appropriate for developmental age) Is the patient deaf or have difficulty hearing?: Yes Does the patient have difficulty seeing, even when wearing glasses/contacts?: No Does the patient have difficulty concentrating, remembering, or making decisions?: Yes  Permission Sought/Granted Permission sought to share information with : Family Supports, Magazine features editor, Case Estate manager/land agent granted to share information with : Yes, Verbal Permission Granted     Emotional Assessment Appearance:: Appears stated age Attitude/Demeanor/Rapport: Unable to Assess Affect (typically observed): Unable to Assess Orientation: : Oriented to Self Alcohol  / Substance Use: Not Applicable Psych Involvement: No (comment)  Admission diagnosis:  Bradycardia [R00.1] Symptomatic anemia [D64.9] Gastrointestinal hemorrhage, unspecified gastrointestinal hemorrhage type [K92.2] Patient Active Problem List   Diagnosis Date Noted   DNR (do not resuscitate) 11/04/2023   Atrial fibrillation with rapid ventricular response (HCC) 10/07/2023   GI bleed 10/03/2023   Hypoxia 08/24/2023   Severe sepsis (HCC) 08/20/2023   Pleural effusion 08/20/2023   Multifocal pneumonia 08/19/2023   Symptomatic bradycardia 08/05/2023   Generalized weakness 07/12/2023   Sinus bradycardia 07/11/2023   FTT (failure to thrive) in adult 06/04/2023   Symptomatic anemia 03/08/2023   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   Acute blood loss anemia 01/27/2023   Paroxysmal atrial flutter (HCC) 12/14/2022  Pressure injury of skin 12/01/2022   Rectal bleeding  11/30/2022   Generalized abdominal pain 11/10/2022   Ileus (HCC) 11/04/2022   Periumbilical abdominal pain 11/04/2022   Macrocytic anemia 10/29/2022   Anemia of chronic disease 10/29/2022   Upper GI bleed 10/29/2022   Iron  deficiency anemia due to chronic blood loss 08/26/2022   Benign neoplasm of transverse colon 08/11/2022   Benign neoplasm of descending colon 08/11/2022   Benign neoplasm of sigmoid colon 08/11/2022   Angiodysplasia of duodenum 08/11/2022   Delirium 04/22/2022   History of arteriovenous malformation (AVM) 04/22/2022   Acute metabolic encephalopathy 11/09/2021   NSTEMI, initial episode of care (HCC) 10/19/2021   Acute on chronic blood loss anemia 09/09/2021   Ischemic cardiomyopathy    Atrial fibrillation, chronic (HCC) 05/07/2021   Neuropathy 04/23/2021   Anemia associated with chronic renal failure 04/13/2021   Left knee pain 03/15/2021   Moderate protein-calorie malnutrition (HCC) 02/27/2021   Hypokalemia 02/27/2021   COVID-19 virus infection 02/03/2021   Leukocytosis 02/03/2021   Hypoalbuminemia due to protein-calorie malnutrition (HCC) 02/03/2021   Acquired hypothyroidism 02/03/2021   Myositis 12/03/2020   Ataxia 12/02/2020   Diabetes mellitus type 2 in nonobese (HCC) 11/10/2020   Failure to thrive in adult 11/09/2020   Dialysis AV fistula malfunction, initial encounter (HCC)    Jugular vein occlusion, right (HCC)    Failure of surgically constructed arteriovenous fistula (HCC) 10/03/2020   Myoclonus 08/31/2020   Clotted renal dialysis AV graft, initial encounter (HCC)    Hemodialysis-associated hypotension    Irritable bowel syndrome 02/25/2020   Pulmonary edema 10/14/2019   Adenomatous duodenal polyp 09/10/2019   History of GI bleed 09/10/2019   Acute respiratory failure with hypoxia (HCC) 12/25/2018   Elevated troponin 12/14/2018   Hand steal syndrome (HCC) 08/01/2017   Anemia 07/14/2017   CAD (coronary artery disease) 06/05/2017   Intestinal  ischemia (HCC)    Complication of vascular access for dialysis 03/19/2017   Preoperative clearance 01/25/2017   H/O non-ST elevation myocardial infarction (NSTEMI) 10/24/2016   Non-ST elevation (NSTEMI) myocardial infarction [facility])    Heme positive stool    Cardiac arrest [facility])    Palliative care encounter    Goals of care, counseling/discussion    Flash pulmonary edema (HCC) 04/06/2016   History of colon cancer 01/27/2016   History of ovarian cancer 01/27/2016   Paroxysmal atrial fibrillation (HCC) 10/14/2015   Malignant neoplasm of right ovary (HCC) 10/14/2015   Wide-complex tachycardia 09/08/2015   SVT (supraventricular tachycardia) (HCC) 09/08/2015   Hypertensive urgency 05/04/2015   Essential hypertension    Dyspnea    Constipation 03/12/2013   Abdominal wall pain in left flank 03/12/2013   Occlusion and stenosis of carotid artery without mention of cerebral infarction 01/24/2013   Hx of CABG 07/05/2012   Carotid artery disease (HCC) 07/05/2012   Mitral regurgitation 06/12/2012   Non-STEMI (non-ST elevated myocardial infarction) (HCC) 06/08/2012   Chronic combined systolic and diastolic CHF (congestive heart failure) (HCC) 05/02/2012   AVM (arteriovenous malformation) of small bowel, acquired with hemorrhage 01/20/2012   GERD (gastroesophageal reflux disease) 01/09/2012   Mixed hyperlipidemia 01/05/2012   Atherosclerotic heart disease of native coronary artery without angina pectoris 12/16/2011   ESRD on hemodialysis (HCC) 12/16/2011   Anxiety disorder 05/04/2011   End-stage renal disease on hemodialysis (HCC) 04/29/2011   Gout 04/29/2011   PCP:  Practice, [facility]:   [id number] [address], [place] - [address] [place]  [place] [address] Phone: [phone] Fax: [phone]  [facility] - [address], [place] - [address] [place] [address] Phone: [phone] Fax: [phone]  Social Drivers of Health  (SDOH) Social History: SDOH Screenings   Food Insecurity: No Food Insecurity (11/09/2023)  Housing: Low Risk  (11/09/2023)  Transportation Needs: No Transportation Needs (11/09/2023)  Utilities: Not At Risk (11/09/2023)  Recent Concern: Utilities - At Risk (11/04/2023)  Financial Resource Strain: Low Risk  (12/14/2018)  Physical Activity: Unknown (12/14/2018)  Social Connections: Socially Integrated (11/09/2023)  Stress: No Stress Concern Present (12/14/2018)  Tobacco Use: Low Risk  (11/09/2023)  Health Literacy: Adequate Health Literacy (08/01/2023)   Received from [facility]  Recent Concern: Health Literacy - Inadequate Health Literacy (07/20/2023)   Received from [facility]   SDOH Interventions: Utilities Interventions: Inpatient TOC, Other (Comment) (Resources added to AVS)   Readmission Risk Interventions    11/06/2023   10:21 AM 09/10/2023    9:23 AM 08/20/2023    2:28 PM  Readmission Risk Prevention Plan  Transportation Screening Complete Complete Complete  Medication Review Oceanographer) Complete Complete Complete  PCP or Specialist appointment within 3-5 days of discharge  Complete Complete  HRI or Home Care Consult Complete Complete Complete  SW Recovery Care/Counseling Consult Complete Complete Complete  Palliative Care Screening Not Applicable Not Applicable   Skilled Nursing Facility Not Applicable Not Applicable

## 2023-11-13 NOTE — Telephone Encounter (Signed)
 Shawna [name] this patient is still inpatient . You had asked us  to get blood work on her last week. Please advise?

## 2023-11-13 NOTE — Progress Notes (Signed)
 Progress Note   Patient: Shawna [name] ZOX:[id number] DOB: [birth year] DOA: 11/03/2023     9 DOS: the patient was seen and examined on 11/13/2023   Brief hospital course: 83 y.o. female,  with a history of AVMs with recurrent GI bleeding, anemia, HFrEF, CAD s/p CABG on aspirin  81mg , PAF, ESRD on HD MWF, T2DM, and colon CA s/p resection 1992 patient well-known to our service with multiple presentation, hospitalization, has known history of acute on chronic blood loss anemia given history of AVMs requiring multiple admissions for transfusions.   - Presents to ED as instructed by her dialysis center for low hemoglobin as she was told at 6.9, she does report increased fatigue, dizziness, weakness, and dark stools, denies nausea, vomiting or abdominal pain, she is not on any aspirin  or anticoagulation, during recent hospitalization she was discharged on metoprolol  and amiodarone .  - In ED labs were significant for stable hemoglobin at 8.3, but she was Hemoccult positive, even though her stool color with no melena, as well she was noted bradycardic heart rate at 41, so triage hospitalist consulted to admit.  Assessment and Plan: Severe symptomatic Sinus bradycardia H/o paroxysmal A-fib with RVR -Bradycardia is most likely in the recent of recently resumed amiodarone  and metoprolol  -Initial Cardiology recs to reduce amiodarone  to 100 mg and DC metoprolol  -EP now recommending rate control only with metoprolol  succinate 37.5mg  BID and not to restart amio -Not candidate for watchman    -Not on anticoagulation secondary to recurrent GI bleed -OT had recommended SNF, TOC consulted  Acute metabolic encephalopathy - Improving - Pleasantly confused -  CT head; no acute findings -Hemodynamically stable     Symptomatic anemia w  Acute on chronic blood loss anemia Anemia of chronic kidney disease - History of AVM Hemoccult positive stool - Patient with known history of anemia, history of GI bleed due to small  bowel AVM, previous recommendation has been made for supportive transfusion and long acting somatostatin which she has been receiving. - Hemoglobin 7.0 this visit, transfused 1 unit PRBC with HD, Hg trending - Dr. Eilene Grater discussed with GI, no plans for further procedures this admission - she is to take her monthly long acting octreotide  20 mg injection when she gets home, our pharmacy does not have the medication in hospital.    Uncontrolled HTN - On hydralazine  25 mg TID and imdur  30 mg daily with Toprol  per above   Hypokalemia - She is ESRD patient, received low-dose replacement only as needed    ESRD:  - Continue routine HD MWF per nephrology     Hypothyroidism: - Continue with Synthroid    Chronic combined HFrEF, CAD, HTN, HLD: - hx CABG, no chest pain. Last echo Feb 2025 w/LVEF 45-50%, G2DD.  - Volume management with HD. - Continue with statin   Subjective: Without complaints this AM. Staff reports pt slept well overnight  Physical Exam: Vitals:   11/13/23 1112 11/13/23 1151 11/13/23 1350 11/13/23 1601  BP: 104/76 (!) 123/59 (!) 120/92   Pulse: (!) 104 81 (!) 26 99  Resp: 18 19 18    Temp: (!) 97.5 F (36.4 C) 97.6 F (36.4 C)  (!) 97 F (36.1 C)  TempSrc:  Axillary  Axillary  SpO2: 95% 99%    Weight: 49 kg     Height:       General exam: Awake, sitting in bed eating breakfast, in nad Respiratory system: Normal respiratory effort, no wheezing Cardiovascular system: regular rate, s1, s2 Gastrointestinal system: Soft, nondistended,  positive BS Central nervous system: CN2-12 grossly intact, strength intact Extremities: Perfused, no clubbing Skin: Normal skin turgor, no notable skin lesions seen Psychiatry: Mood normal // affect appears normal  Data Reviewed:  Labs reviewed: Na 134, K 4.3, Cr 5.10, WBC 4.5, Hgb 8.5, Plts 158  Family Communication: Pt in room, called pt's husband over phone  Disposition: Status is: Inpatient Remains inpatient appropriate  because: severity of illness  Planned Discharge Destination: Home with Home Health    Author: Cherylle Corwin, MD 11/13/2023 5:37 PM  For on call review [email or web address].

## 2023-11-13 NOTE — Progress Notes (Signed)
 [facility] Progress Note  Name: Shawna [name] MRN: [id number] DOB: [birth year]  Subjective:  Seen in dialysis, remains confused  - not answering questions, per staff HD going ok  Vitals:  Vitals:   11/12/23 2305 11/13/23 0524 11/13/23 0742 11/13/23 0750  BP: (!) 183/75 (!) 166/68 (!) 161/54 (!) 159/65  Pulse: (!) 55 60 (!) 55 (!) 52  Resp: 20 20 18 17   Temp: (!) 97.5 F (36.4 C) 97.9 F (36.6 C) 97.6 F (36.4 C)   TempSrc: Oral Oral    SpO2: 97% (!) 9% 98% 99%  Weight:  51.6 kg 51.4 kg   Height:         Physical Exam:    Sleepy, mumbling answers to quetsions  no jvd  Chest cta bilat  Cor reg no RG  Abd soft ntnd no ascites   Ext no LE edema      L internal jugular TDC in place, c/d/i  OP HD: [facility]  MWF   4h  B300  53.5kg  LIJ TDC  Heparin  none Micera 200 mcg every 2 weeks - last given on 4/21 Calcitriol  1.5 mcg 3 times a week Sensipar  30 mg three times a week  Last post weight: on last wed 54.1 kg    Assessment/Plan:  Tachy-brady syndrome w/ severe sinus bradycardia/ hx of PAF: per cardiology -> rx'ing w/ metoprolol .The amio will not be resumed since she can't take OAC.  Acute metabolic encephalopathy: improving though this AM she's sleepy, suspect some waxing/waning Symptomatic anemia / GI bleed: hx of small bowel AVM's. Per GI/ pmd.  ESRD: HD MWF today on schedule  Volume: CXR 4/27 was + for pulm edema. 1st 2 HD sessions got 4.5 L off in total. Last 2 sessions 600cc and 200cc off. No vol excess on exam, on RA. CXR 5/03 still doesn't look great. UF 2.5 L w/ today's HD as tol. Lower dry wt at d/c.  Hypertension: getting hydralazine  + metoprolol . BP's up on admit, better now. Some episodes of hypotension.  Anemia esrd : prn transfusions per pmd. Last esa 4/21. Hb 8-9 after 2 wks in hospital but 7.9 this AM. Will cont esa here w/ darbe 200 mcg weekly.   Secondary hyperparathyroidism: cont rocaltrol + sensipar . No binders listed, phos ok DNR  intervention   Adrian Alba MD [facility] Pager [phone]   Recent Labs  Lab 11/09/23 0410 11/10/23 0908 11/10/23 0945 11/12/23 0407 11/13/23 0603  HGB 8.8*  --    < > 8.5* 7.9*  ALBUMIN  2.6* 2.5*  --   --   --   CALCIUM  8.4* 7.7*   < > 7.8* 7.9*  PHOS 2.9 3.5  --   --   --   CREATININE 3.08* 4.48*   < > 5.10* 6.38*  K 3.4* 3.6   < > 4.3 4.0   < > = values in this interval not displayed.    Inpatient medications:  sodium chloride    Intravenous Once   calcitRIOL   1.75 mcg Oral Q M,W,F-HD   Chlorhexidine  Gluconate Cloth  6 each Topical Q0600   Chlorhexidine  Gluconate Cloth  6 each Topical Q0600   cinacalcet   30 mg Oral Q M,W,F-HD   darbepoetin (ARANESP ) injection - DIALYSIS  200 mcg Subcutaneous Q Sun-1800   folic acid   1 mg Oral Daily   hydrALAZINE   25 mg Oral Q8H   isosorbide  mononitrate  30 mg Oral Daily   levETIRAcetam   500 mg Oral Daily   levothyroxine   50 mcg Oral Q0600   metoprolol  succinate  37.5 mg Oral BID   multivitamin  1 tablet Oral Daily   potassium chloride   40 mEq Oral Once   rosuvastatin   10 mg Oral QHS    acetaminophen , albuterol , camphor-menthol , famotidine , hydrALAZINE , hydrOXYzine , nitroGLYCERIN 

## 2023-11-13 NOTE — Progress Notes (Signed)
   11/13/23 1112  Vitals  Temp (!) 97.5 F (36.4 C)  Pulse Rate (!) 104  Resp 18  BP 104/76  SpO2 95 %  O2 Device Room Air  Weight 49 kg  Type of Weight Post-Dialysis  Oxygen  Therapy  Patient Activity (if Appropriate) In bed  Pulse Oximetry Type Continuous  Post Treatment  Dialyzer Clearance Clear  Hemodialysis Intake (mL) 0 mL  Liters Processed 58.5  Fluid Removed (mL) 2500 mL  Tolerated HD Treatment Yes   Received patient in bed to unit.  Alert and oriented.  Informed consent signed and in chart.   TX duration:3.25hrs  Patient tolerated well.  Transported back to the room  Alert, without acute distress.  Hand-off given to patient's nurse.   Access used: [facility] Access issues: none  Total UF removed: 2.5L Medication(s) given: none    Na'[facility]y [facility]

## 2023-11-14 ENCOUNTER — Other Ambulatory Visit (HOSPITAL_COMMUNITY): Payer: Self-pay

## 2023-11-14 DIAGNOSIS — I4892 Unspecified atrial flutter: Secondary | ICD-10-CM | POA: Diagnosis not present

## 2023-11-14 DIAGNOSIS — R001 Bradycardia, unspecified: Secondary | ICD-10-CM | POA: Diagnosis not present

## 2023-11-14 LAB — CBC
HCT: 23.9 % — ABNORMAL LOW (ref 36.0–46.0)
Hemoglobin: 8 g/dL — ABNORMAL LOW (ref 12.0–15.0)
MCH: 33.8 pg (ref 26.0–34.0)
MCHC: 33.5 g/dL (ref 30.0–36.0)
MCV: 100.8 fL — ABNORMAL HIGH (ref 80.0–100.0)
Platelets: 150 10*3/uL (ref 150–400)
RBC: 2.37 MIL/uL — ABNORMAL LOW (ref 3.87–5.11)
RDW: 26.6 % — ABNORMAL HIGH (ref 11.5–15.5)
WBC: 3.9 10*3/uL — ABNORMAL LOW (ref 4.0–10.5)
nRBC: 0 % (ref 0.0–0.2)

## 2023-11-14 LAB — BASIC METABOLIC PANEL WITH GFR
Anion gap: 11 (ref 5–15)
BUN: 27 mg/dL — ABNORMAL HIGH (ref 8–23)
CO2: 26 mmol/L (ref 22–32)
Calcium: 8.3 mg/dL — ABNORMAL LOW (ref 8.9–10.3)
Chloride: 98 mmol/L (ref 98–111)
Creatinine, Ser: 4.31 mg/dL — ABNORMAL HIGH (ref 0.44–1.00)
GFR, Estimated: 10 mL/min — ABNORMAL LOW (ref >60)
Glucose, Bld: 84 mg/dL (ref 70–99)
Potassium: 4 mmol/L (ref 3.5–5.1)
Sodium: 135 mmol/L (ref 135–145)

## 2023-11-14 MED ORDER — METOPROLOL SUCCINATE ER 25 MG PO TB24
37.5000 mg | ORAL_TABLET | Freq: Two times a day (BID) | ORAL | 0 refills | Status: DC
  Filled 2023-11-14: qty 90, 30d supply, fill #0

## 2023-11-14 MED ORDER — CHLORHEXIDINE GLUCONATE CLOTH 2 % EX PADS
6.0000 | MEDICATED_PAD | Freq: Every day | CUTANEOUS | Status: DC
  Administered 2023-11-14: 6 via TOPICAL

## 2023-11-14 MED ORDER — ISOSORBIDE MONONITRATE ER 30 MG PO TB24
30.0000 mg | ORAL_TABLET | Freq: Every day | ORAL | 0 refills | Status: DC
  Filled 2023-11-14: qty 30, 30d supply, fill #0

## 2023-11-14 MED ORDER — CHLORHEXIDINE GLUCONATE CLOTH 2 % EX PADS: 6.0000 | MEDICATED_PAD | Freq: Every day | CUTANEOUS | Status: DC

## 2023-11-14 NOTE — Telephone Encounter (Signed)
 Per Antony Baumgartner, hgb stable and has upcoming appointment with [facility], no need for additional blood work at this time.

## 2023-11-14 NOTE — Telephone Encounter (Signed)
 Looks like Hgb has been stable during hospitalization.   Keep appt with Jearlean Mince on 5/20.

## 2023-11-14 NOTE — TOC Progression Note (Addendum)
 Transition of Care [facility]) - Progression Note    Patient Details  Name: Shawna [name] MRN: [id number] Date of Birth: 05-12-1941  Transition of Care [facility]) CM/SW Contact  Graves-Bigelow, Jari Merles, RN Phone Number: 11/14/2023, 11:43 AM  Clinical Narrative:  Patient is currently active with [facility]. Patient is currently active with RN/PT- will need resumption orders and face to face. Case Manager will fax clinicals to [phone] once she is stable to return home. No further needs identified at this time.   1352 11-14-23 Clinicals submitted to [facility]- no further needs identified.   Expected Discharge Plan: Home w Home Health Services Barriers to Discharge: Continued Medical Work up  Expected Discharge Plan and Services In-house Referral: NA Discharge Planning Services: CM Consult Post Acute Care Choice: Resumption of Svcs/PTA Provider, Home Health Living arrangements for the past 2 months: Single Family Home Expected Discharge Date: 11/05/23                         HH Arranged: RN, Disease Management, PT HH Agency: [facility] Date [facility] Agency Contacted: 11/13/23 Time HH Agency Contacted: 1638 Representative spoke with at [facility] Agency: Candice Chalet   Social Determinants of Health (SDOH) Interventions SDOH Screenings   Food Insecurity: No Food Insecurity (11/09/2023)  Housing: Low Risk  (11/09/2023)  Transportation Needs: No Transportation Needs (11/09/2023)  Utilities: Not At Risk (11/09/2023)  Recent Concern: Utilities - At Risk (11/04/2023)  Financial Resource Strain: Low Risk  (12/14/2018)  Physical Activity: Unknown (12/14/2018)  Social Connections: Socially Integrated (11/09/2023)  Stress: No Stress Concern Present (12/14/2018)  Tobacco Use: Low Risk  (11/09/2023)  Health Literacy: Adequate Health Literacy (08/01/2023)   Received from [facility]  Recent Concern: Health Literacy - Inadequate Health Literacy (07/20/2023)    [facility] from [facility]    Readmission Risk Interventions    11/06/2023   10:21 AM 09/10/2023    9:23 AM 08/20/2023    2:28 PM  Readmission Risk Prevention Plan  Transportation Screening Complete Complete Complete  Medication Review Oceanographer) Complete Complete Complete  PCP or Specialist appointment within 3-5 days of discharge  Complete Complete  HRI or Home Care Consult Complete Complete Complete  SW Recovery Care/Counseling Consult Complete Complete Complete  Palliative Care Screening Not Applicable Not Applicable   Skilled Nursing Facility Not Applicable Not Applicable

## 2023-11-14 NOTE — Discharge Summary (Signed)
 Physician Discharge Summary   Patient: Shawna [name] MRN: [id number] DOB: 07-03-1941  Admit date:     11/03/2023  Discharge date: 11/14/23  Discharge Physician: Cherylle Corwin   PCP: [facility]   Recommendations at discharge:    Follow up with PCP in 1-2 weeks  Discharge Diagnoses: Active Problems:   Acute on chronic blood loss anemia   AVM (arteriovenous malformation) of small bowel, acquired with hemorrhage   Diabetes mellitus type 2 in nonobese [facility])   Acquired hypothyroidism   CAD (coronary artery disease)   Anemia associated with chronic renal failure   Atrial fibrillation, chronic (HCC)   Angiodysplasia of duodenum   Symptomatic anemia   FTT (failure to thrive) in adult   Generalized weakness   Symptomatic bradycardia   DNR (do not resuscitate)  Resolved Problems:   * No resolved hospital problems. *  Hospital Course: 83 y.o. female,  with a history of AVMs with recurrent GI bleeding, anemia, HFrEF, CAD s/p CABG on aspirin  81mg , PAF, ESRD on HD MWF, T2DM, and colon CA s/p resection 1992 patient well-known to our service with multiple presentation, hospitalization, has known history of acute on chronic blood loss anemia given history of AVMs requiring multiple admissions for transfusions.   - Presents to ED as instructed by her dialysis center for low hemoglobin as she was told at 6.9, she does report increased fatigue, dizziness, weakness, and dark stools, denies nausea, vomiting or abdominal pain, she is not on any aspirin  or anticoagulation, during recent hospitalization she was discharged on metoprolol  and amiodarone .  - In ED labs were significant for stable hemoglobin at 8.3, but she was Hemoccult positive, even though her stool color with no melena, as well she was noted bradycardic heart rate at 41, so triage hospitalist consulted to admit.  Assessment and Plan: Severe symptomatic Sinus bradycardia H/o paroxysmal A-fib with RVR -Bradycardia is most  likely in the recent of recently resumed amiodarone  and metoprolol  -Initial Cardiology recs to reduce amiodarone  to 100 mg and DC metoprolol  -EP now recommending rate control only with metoprolol  succinate 37.5mg  BID and not to restart amio -Not candidate for watchman    -Not on anticoagulation secondary to recurrent GI bleed   Acute metabolic encephalopathy - Improving - Pleasantly confused -  CT head; no acute findings -Hemodynamically stable     Symptomatic anemia w  Acute on chronic blood loss anemia Anemia of chronic kidney disease - History of AVM Hemoccult positive stool - Patient with known history of anemia, history of GI bleed due to small bowel AVM, previous recommendation has been made for supportive transfusion and long acting somatostatin which she has been receiving. - Hemoglobin 7.0 this visit, transfused 1 unit PRBC with HD, Hg trending - Dr. Eilene Grater discussed with GI, no plans for further procedures this admission - she is to take her monthly long acting octreotide  20 mg injection when she gets home, our pharmacy does not have the medication in hospital.    Uncontrolled HTN - On hydralazine  25 mg TID and imdur  30 mg daily with Toprol  per above   Hypokalemia - She is ESRD patient, received low-dose replacement only as needed    ESRD:  - Continue routine HD MWF per nephrology     Hypothyroidism: - Continue with Synthroid    Chronic combined HFrEF, CAD, HTN, HLD: - hx CABG, no chest pain. Last echo Feb 2025 w/LVEF 45-50%, G2DD.  - Volume management with HD. - Continue with statin  Consultants: Nephrology, Cardiology, EP Procedures performed:   Disposition: Home Diet recommendation:  Renal diet DISCHARGE MEDICATION: Allergies as of 11/14/2023       Reactions   Amlodipine  Swelling   Aspirin  Other (See Comments)   High Doses Mess up her stomach; "makes my bowels have blood in them". Takes 81 mg EC Aspirin     Nitrofurantoin Hives   Ranexa   [ranolazine ] Other (See Comments)   Myoclonus-hospitalized    Bactrim [sulfamethoxazole-trimethoprim] Rash   Iodinated Contrast Media Itching, Other (See Comments)   Iron  Itching, Other (See Comments)   "they gave me iron  in dialysis; had to give me Benadryl  cause I had to have the iron " (05/02/2012)   Tylenol  [acetaminophen ] Itching, Other (See Comments)   Makes her feet on fire per pt - can tolerate per pt   Gabapentin  Other (See Comments)   Unknown reaction   Sucroferric Oxyhydroxide Other (See Comments)   Unknown   Levaquin  [levofloxacin  In D5w] Rash   Levofloxacin  Rash, Other (See Comments)   Plavix  [clopidogrel  Bisulfate] Rash   Protonix  [pantoprazole  Sodium] Rash   Venofer [ferric Oxide] Itching, Other (See Comments)   Patient reports using Benadryl  prior to doses as [facility]        Medication List     STOP taking these medications    amiodarone  200 MG tablet Commonly known as: PACERONE    metoprolol  tartrate 25 MG tablet Commonly known as: LOPRESSOR        TAKE these medications    albuterol  108 (90 Base) MCG/ACT inhaler Commonly known as: VENTOLIN  HFA SMARTSIG:2 Puff(s) By Mouth Every 4-6 Hours PRN   albuterol  (2.5 MG/3ML) 0.083% nebulizer solution Commonly known as: PROVENTIL  USE 1 VIAL IN NEBULIZER 4 TIMES DAILY AS NEEDED FOR COUGH, WHEEZING, AND SHORTNESS OF BREATH   benzonatate  100 MG capsule Commonly known as: TESSALON  Take 1 capsule (100 mg total) by mouth 2 (two) times daily as needed for cough. What changed: when to take this   calcitRIOL  0.25 MCG capsule Commonly known as: ROCALTROL  Take 7 capsules (1.75 mcg total) by mouth every Monday, Wednesday, and Friday with hemodialysis.   cinacalcet  30 MG tablet Commonly known as: SENSIPAR  Take 1 tablet (30 mg total) by mouth every Monday, Wednesday, and Friday with hemodialysis.   cyanocobalamin  1000 MCG/ML injection Commonly known as: VITAMIN B12 Inject 1,000 mcg into the muscle every 30  (thirty) days.   famotidine  20 MG tablet Commonly known as: Pepcid  Take 1 tablet (20 mg total) by mouth daily as needed for heartburn or indigestion.   folic acid  1 MG tablet Commonly known as: FOLVITE  Take 1 mg by mouth daily.   guaifenesin  100 MG/5ML syrup Commonly known as: ROBITUSSIN Take 200 mg by mouth 3 (three) times daily as needed for cough.   hydrOXYzine  25 MG tablet Commonly known as: ATARAX  Take 25 mg by mouth every 6 (six) hours as needed for itching.   isosorbide  mononitrate 30 MG 24 hr tablet Commonly known as: IMDUR  Take 1 tablet (30 mg total) by mouth daily. Start taking on: Nov 15, 2023   levETIRAcetam  500 MG tablet Commonly known as: KEPPRA  Take 1 tablet (500 mg total) by mouth daily.   levothyroxine  50 MCG tablet Commonly known as: SYNTHROID  Take 50 mcg by mouth daily.   metoprolol  succinate 25 MG 24 hr tablet Commonly known as: TOPROL -XL Take 1.5 tablets (37.5 mg total) by mouth 2 (two) times daily.   multivitamin Tabs tablet Take 1 tablet by mouth daily.   nitroGLYCERIN   0.4 MG SL tablet Commonly known as: NITROSTAT  Place 0.4 mg under the tongue every 5 (five) minutes as needed for chest pain.   ondansetron  4 MG disintegrating tablet Commonly known as: ZOFRAN -ODT Take 4 mg by mouth every 8 (eight) hours as needed for nausea or vomiting.   rosuvastatin  10 MG tablet Commonly known as: CRESTOR  Take 1 tablet by mouth once daily   SandoSTATIN  LAR [place] 20 MG injection Generic drug: octreotide  Inject 20 mg into the muscle every 28 (twenty-eight) days.        Follow-up Information     [facility]. Schedule an appointment as soon as possible for a visit in 1 week(s).   Why: check labs and heart rate, Hospital Follow Up Contact information: [address] [place] [address] [phone]         [facility] Emergency Department at [facility] .   Specialty: Emergency Medicine Why: As needed Contact information: [address] [place]  [phone]        [facility] HeartCare at [facility]. Schedule an appointment as soon as possible for a visit in 1 week(s).   Specialty: Cardiology Why: Hospital Follow Up Contact information: [address] [place]  [address] [phone]        [facility] Gastroenterology at [facility]. Schedule an appointment as soon as possible for a visit in 1 week(s).   Specialty: Gastroenterology Why: Hospital Follow Up and check CBC Contact information: [address] [name]  [address] [phone]        Umberto Ganong, Bearl Limes, MD. Schedule an appointment as soon as possible for a visit in 1 week(s).   Specialty: Gastroenterology Why: Hospital Follow Up Contact information: [address] [place] [address] [phone]         [facility] Home Health Care Follow up.   Why: Will contact you to schedule home health visits. Contact information: [address] [place] [address] [phone]                Discharge Exam: Cleavon Curls Weights   11/13/23 0524 11/13/23 0742 11/13/23 1112  Weight: 51.6 kg 51.4 kg 49 kg   General exam: Awake, laying in bed, in nad Respiratory system: Normal respiratory effort, no wheezing Cardiovascular system: regular rate, s1, s2 Gastrointestinal system: Soft, nondistended, positive BS Central nervous system: CN2-12 grossly intact, strength intact Extremities: Perfused, no clubbing Skin: Normal skin turgor, no notable skin lesions seen Psychiatry: Mood normal // no visual hallucinations   Condition at discharge: fair  The results of significant diagnostics from this hospitalization (including imaging, microbiology, ancillary and laboratory) are listed below for reference.   Imaging Studies: DG CHEST PORT 1 VIEW Result Date: 11/11/2023 CLINICAL DATA:  Follow-up exam. EXAM: PORTABLE CHEST 1 VIEW COMPARISON:   11/05/2023. FINDINGS: Heart is enlarged and the mediastinal contour stable. There is atherosclerotic calcification of the aorta. The pulmonary vasculature is distended. Lung volumes are low with edema, atelectasis or infiltrate at the lung bases. There small bilateral pleural effusions. No pneumothorax is seen. A stable left internal jugular central venous catheter is present. A vascular stent is noted in the left axilla. Sternotomy wires are present over the midline. IMPRESSION: 1. Cardiomegaly with pulmonary vascular congestion. 2. Patchy atelectasis, edema, or infiltrate at the lung bases. 3. Small bilateral pleural effusions. Electronically Signed   By: Wyvonnia Heimlich M.D.   On: 11/11/2023 19:45   CT HEAD WO  CONTRAST ( ) Result Date: 11/05/2023 CLINICAL DATA:  Mental status change.  Unknown cause. EXAM: CT HEAD WITHOUT CONTRAST TECHNIQUE: Contiguous axial images were obtained from the base of the skull through the vertex without intravenous contrast. RADIATION DOSE REDUCTION: This exam was performed according to the departmental dose-optimization program which includes automated exposure control, adjustment of the mA and/or kV according to patient size and/or use of iterative reconstruction technique. COMPARISON:  CT head without contrast 09/08/2023. MR head without contrast 10/09/2023. FINDINGS: Brain: The thin extra-axial collections or dural thickening is again noted, stable. No acute infarct, hemorrhage, or mass lesion is present. Atrophy and white matter disease is moderately advanced for age. Remote lacunar infarcts are present in the basal ganglia bilaterally. Vascular: Atherosclerotic calcifications are present within the cavernous internal carotid arteries bilaterally and at the dural margin of both vertebral arteries. Skull: Calvarium is intact. No focal lytic or blastic lesions are present. No significant extracranial soft tissue lesion is present. Sinuses/Orbits: A polyp or mucous retention cyst  is again noted in the left sphenoid sinus. The paranasal sinuses and mastoid air cells are otherwise clear. IMPRESSION: 1. No acute intracranial abnormality or significant interval change. 2. Stable thin extra-axial collections or dural thickening. 3. Atrophy and white matter disease is moderately advanced for age. This likely reflects the sequela of chronic microvascular ischemia. 4. Remote lacunar infarcts in the basal ganglia bilaterally. Electronically Signed   By: Audree Leas M.D.   On: 11/05/2023 13:22   DG CHEST PORT 1 VIEW Result Date: 11/05/2023 CLINICAL DATA:  Cough.  Altered mental status. EXAM: PORTABLE CHEST 1 VIEW COMPARISON:  11/20/2023. FINDINGS: Prominent cardiac silhouette. Pulmonary vascular congestion consistent with interstitial edema and bibasilar consolidation or volume loss. Bilateral small pleural effusions. Postop median sternotomy and CABG. Left-sided dialysis catheter tip distal SVC at the RA/SVC junction. IMPRESSION: Findings consistent with CHF. Electronically Signed   By: Sydell Eva M.D.   On: 11/05/2023 13:15    Microbiology: Results for orders placed or performed during the hospital encounter of 10/03/23  MRSA Next Gen by PCR, Nasal     Status: None   Collection Time: 10/05/23  3:10 PM   Specimen: Nasal Mucosa; Nasal Swab  Result Value Ref Range Status   MRSA by PCR Next Gen NOT DETECTED NOT DETECTED Final    Comment: (NOTE) The GeneXpert MRSA Assay (FDA approved for NASAL specimens only), is one component of a comprehensive MRSA colonization surveillance program. It is not intended to diagnose MRSA infection nor to guide or monitor treatment for MRSA infections. Test performance is not FDA approved in patients less than 11 years old. Performed at [facility], [address] [place], [place] [address]    *Note: Due to a large number of results and/or encounters for the requested time period, some results have not been displayed. A complete set of  results can be found in Results Review.    Labs: CBC: Recent Labs  Lab 11/10/23 0945 11/11/23 1538 11/12/23 0407 11/13/23 0603 11/14/23 0520  WBC 4.9 4.1 4.5 3.9* 3.9*  HGB 8.4* 8.2* 8.5* 7.9* 8.0*  HCT 25.8* 25.4* 25.3* 23.6* 23.9*  MCV 102.8* 104.1* 100.8* 102.2* 100.8*  PLT 160 151 158 145* 150   Basic Metabolic Panel: Recent Labs  Lab 11/08/23 0459 11/09/23 0410 11/10/23 0908 11/11/23 1538 11/12/23 0407 11/13/23 0603 11/14/23 0520  NA 141 138 138 135 134* 135 135  K 3.7 3.4* 3.6 4.0 4.3 4.0 4.0  CL 103 101 103 100 99  101 98  CO2 25 25 23 24 24 24 26   GLUCOSE 114* 119* 97 115* 97 159* 84  BUN 38* 22 32* 28* 37* 50* 27*  CREATININE 4.76* 3.08* 4.48* 4.42* 5.10* 6.38* 4.31*  CALCIUM  8.5* 8.4* 7.7* 7.9* 7.8* 7.9* 8.3*  MG 1.6* 2.1  --   --   --   --   --   PHOS 4.3 2.9 3.5  --   --   --   --    Liver Function Tests: Recent Labs  Lab 11/08/23 0459 11/09/23 0410 11/10/23 0908  ALBUMIN  2.9* 2.6* 2.5*   CBG: Recent Labs  Lab 11/10/23 1127 11/11/23 0829 11/11/23 1214 11/11/23 2128 11/12/23 0029  GLUCAP 79 82 83 146* 101*    Discharge time spent: less than 30 minutes.  Signed: Cherylle Corwin, MD [facility] 11/14/2023

## 2023-11-14 NOTE — Progress Notes (Signed)
 Pt refusing daily weight and CHG wipes this am. She states she is trying to sleep. Will attempt once she wakes for the morning and wants to get into chair

## 2023-11-14 NOTE — Progress Notes (Signed)
 D/C order noted. Contacted [facility] to advise staff of pt's d/c today and that pt should resume care tomorrow. D/C summary and today's renal note faxed to clinic for continuation of care.   Lauraine Polite Renal Navigator [phone]

## 2023-11-14 NOTE — Progress Notes (Signed)
 [facility] Progress Note   Subjective:    Seen and examined patient at bedside. Tolerated yesterday's HD with net UF 2.5L. She remains sleepy and on RA.   Objective Vitals:   11/13/23 1601 11/13/23 2107 11/14/23 0532 11/14/23 0814  BP:  (!) 145/47 (!) 129/45 (!) 121/34  Pulse: 99 (!) 57 (!) 59 60  Resp:  16 16 20   Temp: (!) 97 F (36.1 C) 98.2 F (36.8 C) 98.1 F (36.7 C) 98 F (36.7 C)  TempSrc: Axillary Oral Oral Oral  SpO2:  97%    Weight:      Height:       Physical Exam General: Elderly female; frail; NAD; RA Heart: S1 and S2; No murmurs, gallops, or rubs Lungs: Diminished lower bases; clear in uppers Abdomen: Soft and non-tender Extremities: No LE edema Dialysis Access: [facility]   Filed Weights   11/13/23 0524 11/13/23 0742 11/13/23 1112  Weight: 51.6 kg 51.4 kg 49 kg    Intake/Output Summary (Last 24 hours) at 11/14/2023 0853 Last data filed at 11/13/2023 1329 Gross per 24 hour  Intake 0 ml  Output 2500 ml  Net -2500 ml    Additional Objective Labs: Basic Metabolic Panel: Recent Labs  Lab 11/08/23 0459 11/09/23 0410 11/10/23 0908 11/11/23 1538 11/12/23 0407 11/13/23 0603 11/14/23 0520  NA 141 138 138   < > 134* 135 135  K 3.7 3.4* 3.6   < > 4.3 4.0 4.0  CL 103 101 103   < > 99 101 98  CO2 25 25 23    < > 24 24 26   GLUCOSE 114* 119* 97   < > 97 159* 84  BUN 38* 22 32*   < > 37* 50* 27*  CREATININE 4.76* 3.08* 4.48*   < > 5.10* 6.38* 4.31*  CALCIUM  8.5* 8.4* 7.7*   < > 7.8* 7.9* 8.3*  PHOS 4.3 2.9 3.5  --   --   --   --    < > = values in this interval not displayed.   Liver Function Tests: Recent Labs  Lab 11/08/23 0459 11/09/23 0410 11/10/23 0908  ALBUMIN  2.9* 2.6* 2.5*   No results for input(s): "LIPASE", "AMYLASE" in the last 168 hours. CBC: Recent Labs  Lab 11/10/23 0945 11/11/23 1538 11/12/23 0407 11/13/23 0603 11/14/23 0520  WBC 4.9 4.1 4.5 3.9* 3.9*  HGB 8.4* 8.2* 8.5* 7.9* 8.0*  HCT 25.8* 25.4* 25.3* 23.6*  23.9*  MCV 102.8* 104.1* 100.8* 102.2* 100.8*  PLT 160 151 158 145* 150   Blood Culture    Component Value Date/Time   SDES BLOOD RIGHT HAND 08/19/2023 2247   SPECREQUEST NONE 08/19/2023 2247   CULT  08/19/2023 2247    NO GROWTH 5 DAYS Performed at [facility], [address] [place], [place] [address]    REPTSTATUS 08/24/2023 FINAL 08/19/2023 2247    Cardiac Enzymes: No results for input(s): "CKTOTAL", "CKMB", "CKMBINDEX", "TROPONINI" in the last 168 hours. CBG: Recent Labs  Lab 11/10/23 1127 11/11/23 0829 11/11/23 1214 11/11/23 2128 11/12/23 0029  GLUCAP 79 82 83 146* 101*   Iron  Studies: No results for input(s): "IRON ", "TIBC", "TRANSFERRIN", "FERRITIN" in the last 72 hours. Lab Results  Component Value Date   INR 1.0 08/19/2023   INR 1.3 (H) 11/08/2021   INR 1.0 02/03/2021   Studies/Results: No results found.  Medications:   sodium chloride    Intravenous Once   calcitRIOL   1.75 mcg Oral Q M,W,F-HD   Chlorhexidine  Gluconate Cloth  6 each Topical Daily   cinacalcet   30 mg Oral Q M,W,F-HD   darbepoetin (ARANESP ) injection - DIALYSIS  200 mcg Subcutaneous Q Sun-1800   folic acid   1 mg Oral Daily   hydrALAZINE   25 mg Oral Q8H   isosorbide  mononitrate  30 mg Oral Daily   levETIRAcetam   500 mg Oral Daily   levothyroxine   50 mcg Oral Q0600   metoprolol  succinate  37.5 mg Oral BID   multivitamin  1 tablet Oral Daily   potassium chloride   40 mEq Oral Once   rosuvastatin   10 mg Oral QHS    Dialysis Orders: [facility]  MWF   4h  B300  53.5kg  [facility] TDC  Heparin  none Micera 200 mcg every 2 weeks - last given on 4/21 Calcitriol  1.5 mcg 3 times a week Sensipar  30 mg three times a week  Last post weight: on last wed 54.1 kg   Assessment/Plan: Tachy-brady syndrome w/ severe sinus bradycardia/ hx of PAF: per cardiology -> rx'ing w/ metoprolol .The amio will not be resumed since she can't take OAC.  Acute metabolic encephalopathy: she remains sleepy, suspect some  waxing/waning Symptomatic anemia / GI bleed: hx of small bowel AVM's. Per GI/ pmd.  ESRD: HD MWF. Next HD 11/15/23. Volume: CXR 4/27 was + for pulm edema. 1st 2 HD sessions got 4.5 L off in total. Last 2 sessions 600cc and 200cc off. No vol excess on exam, on RA. CXR 5/03 still doesn't look great. UF 2.5 L w/ today's HD as tol. Lower dry wt at d/c.  Hypertension: getting hydralazine  + metoprolol . BP's up on admit, better now. Some episodes of hypotension.  Anemia esrd : prn transfusions per pmd. Last esa 4/21. Hb 8-9 after 2 wks in hospital but 7.9 this AM. Will cont esa here w/ darbe 200 mcg weekly.   Secondary hyperparathyroidism: cont rocaltrol + sensipar . No binders listed, phos ok DNR intervention  Shawna Maxwell, NP [facility] 11/14/2023,8:53 AM  LOS: 10 days

## 2023-11-22 ENCOUNTER — Inpatient Hospital Stay: Admitting: Gastroenterology

## 2023-11-27 NOTE — Progress Notes (Deleted)
 GI Office Note    Referring Provider: [facility] Primary Care Physician:  [facility] Primary Gastroenterologist: Urban Garden, MD  Date:  11/27/2023  ID:  Shawna [name], DOB 01-23-1941, MRN [id number]   Chief Complaint   No chief complaint on file.  History of Present Illness  MARKEITA [name] is a 83 y.o. female with a history of *** presenting today with complaint of   Admission to [facility] in January 2024 for anemia. Underwent EGD and colonoscopy 08/11/2022. 1 small bowel AVM ablated, small duodenal polyp removed (duodenal adenoma), colonoscopy with healthy-appearing anastomosis x 2, 6 polyps removed in the transverse descending sigmoid colons, tubular adenomas on pathology.   Seen by our service during hospitalization 08/27/22 for anemia. Patient with baseline dementia therefore history difficult to obtain. No melena, brbpr, abdominal pain. Hgb 6.6. on admission and received 2u PRBC. Underwent EGD and VCE as noted below. Advised to consider octreotide  subcu outpatient.    Procedure history: 07/2016 EGD for anemia: -prior duodenal AVMs, recent NSTEMI and pending cardiac cath.  2 cm hiatal hernia.  Normal stomach and esophagus.  Solitary duodenal versus jejunal polyp was not removed.  Nonbleeding jejunal AVM treated with APC and may have been source for anemia.   02/2019 colonoscopy.  For evaluation gastrointestinal bleeding: -Pandiverticulosis.  Patent ileocolonic anastomosis.  Removed 2 small polyps (TAs) from ascending colon.  Piecemeal resection using a lift technique of large polyp (TA) in transverse colon.   09/2019 EGD for evaluation duodenal polyp: Esophagus, Z-line normal.  3 cm hiatal hernia.  Normal stomach and duodenal bulb.  Solitary, diminutive, sessile polyp at D2 was biopsied (adenoma wo HGD).  Exam completed to third duodenum.   02/05/2021 EGD for melena, blood loss anemia: -Showed mild severity esophageal candidiasis without bleeding.   Gastritis, nonbleeding.  Normal examined duodenum to D2.  Treated with Diflucan  for 14 days and continue daily oral PPI.   04/22/2021 VCE: -Successful, complete study.  Small, flat polyp in either second or third portion duodenum with no stigmata of bleeding.  2 polyps at distal small bowel, 1 small size the other polyp medium.  No bleeding stigmata in either of these polyps.  Dr Homero Luster rec push enteroscopy.     06/08/2021 colonoscopy, enteroscopy were cxld due to IV infiltration RUE.  LUE not amenable to IV as her fistula is in that arm..  Plans for outpt PICC line and proceed with GI procedures in January 2022, but procedures never happened. Meds include low-dose aspirin .  Prilosec 20 mg/day, Zofran  prn.  Folic acid . Mircera, last dose 1/22.    08/11/2022 EGD: -2 cm hiatal hernia, normal esophagus otherwise, duodenal AVM status post APC, 5 mm duodenal polyp removed (duodenal adenoma)   08/11/2022 colonoscopy: -end-to-end ileocolonic anastomosis ascending colon, end-to-side colocolonic anastomosis rectosigmoid colon, normal neoterminal ileum, 6 polyps removed in the transverse, descending, sigmoid colons.  Tubular adenomas on pathology   EGD 08/27/2022: -multiple gastric AVMs treated with APC, small hiatal hernia, no active or stigmata of bleeding. VCE placed   VCE 08/27/22 read 08/28/22 -A few small AVMs in the small bowel, likely jejunum.  2 small polyps in the distal small bowel previously noted.  No active or stigmata of bleeding.  Capsule appears to sit at the ileocolonic anastomosis for the last 2 to 3 hours of study.   KUB 08/28/22 showed capsule in RLQ   KUB 08/29/22 with capsule likely within the rectum  (lower midline pelvis)  Last office visit March 2024. ***  Small bowel endoscopy 11/2022 - Esophagogastric landmarks identified. - 2 cm hiatal hernia. - A few non-bleeding angiodysplastic lesions in the stomach. Treated with argon plasma coagulation (APC). - Normal stomach otherwise -  Normal examined duodenum. - A tattoo was seen in the jejunum with a small  polyp. - Jejunal diverticulum - several, one large. - Six non-bleeding angiodysplastic lesions in the proximal jejunum beyond the tattoo. Treated with argon plasma coagulation (APC). AVMs are the likely cause of bleeding / anemia for this patient with ESRD. Ablated. NO active bleeding    Hospitalized in March 2025 an again in April 2025. In march presented with rectal bleeding x1. Having good appetite. Iron  studies normal. Hgb ranged 7.1-8.3. In April (4/25) she presented at recommendation of outpatient provider for Hgb 6.9. In ED Hgb 8.3 and was FOBT positive and she was offered EGD with enteroscopy and she declined. She was transfused and advised to continue monthly octreotide  injections. She was discharged 5/6 with hgb 8 and at one point during hospitalization she was up to 10.4 on 4/27 and 9.3 on 4/30.   Today:    Wt Readings from Last 3 Encounters:  11/13/23 108 lb 0.4 oz (49 kg)  10/09/23 126 lb 5.2 oz (57.3 kg)  10/08/23 126 lb 5.2 oz (57.3 kg)    Current Outpatient Medications  Medication Sig Dispense Refill   albuterol  (PROVENTIL ) (2.5 MG/3ML) 0.083% nebulizer solution USE 1 VIAL IN NEBULIZER 4 TIMES DAILY AS NEEDED FOR COUGH, WHEEZING, AND SHORTNESS OF BREATH     albuterol  (VENTOLIN  HFA) 108 (90 Base) MCG/ACT inhaler SMARTSIG:2 Puff(s) By Mouth Every 4-6 Hours PRN     benzonatate  (TESSALON ) 100 MG capsule Take 1 capsule (100 mg total) by mouth 2 (two) times daily as needed for cough. 15 capsule 0   calcitRIOL  (ROCALTROL ) 0.25 MCG capsule Take 7 capsules (1.75 mcg total) by mouth every Monday, Wednesday, and Friday with hemodialysis.     cinacalcet  (SENSIPAR ) 30 MG tablet Take 1 tablet (30 mg total) by mouth every Monday, Wednesday, and Friday with hemodialysis. 60 tablet    cyanocobalamin  (VITAMIN B12) 1000 MCG/ML injection Inject 1,000 mcg into the muscle every 30 (thirty) days.     famotidine  (PEPCID ) 20  MG tablet Take 1 tablet (20 mg total) by mouth daily as needed for heartburn or indigestion.     folic acid  (FOLVITE ) 1 MG tablet Take 1 mg by mouth daily.     guaifenesin  (ROBITUSSIN) 100 MG/5ML syrup Take 200 mg by mouth 3 (three) times daily as needed for cough.     hydrOXYzine  (ATARAX ) 25 MG tablet Take 25 mg by mouth every 6 (six) hours as needed for itching.     isosorbide  mononitrate (IMDUR ) 30 MG 24 hr tablet Take 1 tablet (30 mg total) by mouth daily. 30 tablet 0   levETIRAcetam  (KEPPRA ) 500 MG tablet Take 1 tablet (500 mg total) by mouth daily. 30 tablet 0   levothyroxine  (SYNTHROID ) 50 MCG tablet Take 50 mcg by mouth daily.     metoprolol  succinate (TOPROL -XL) 25 MG 24 hr tablet Take 1.5 tablets (37.5 mg total) by mouth 2 (two) times daily. 90 tablet 0   multivitamin (RENA-VIT) TABS tablet Take 1 tablet by mouth daily.     nitroGLYCERIN  (NITROSTAT ) 0.4 MG SL tablet Place 0.4 mg under the tongue every 5 (five) minutes as needed for chest pain.     octreotide  (SANDOSTATIN  LAR) 20 MG injection Inject 20 mg into the muscle every 28 (twenty-eight) days. 1  each 0   ondansetron  (ZOFRAN -ODT) 4 MG disintegrating tablet Take 4 mg by mouth every 8 (eight) hours as needed for nausea or vomiting.     rosuvastatin  (CRESTOR ) 10 MG tablet Take 1 tablet by mouth once daily 90 tablet 0   No current facility-administered medications for this visit.    Past Medical History:  Diagnosis Date   Acute on chronic respiratory failure with hypoxia (HCC) 10/10/2016   Anxiety    Arthritis    AVM (arteriovenous malformation) of colon    CAD (coronary artery disease)    a. s/p CABG in 2013 b. DES to D1 in 10/2016. c. cath in 07/2018 showing patent grafts with occlusion of D1 at prior stent site and progression of PDA disease --> medical management recommended   Carotid artery disease (HCC)    a. 60-79% LICA, 03/2012    Chronic bronchitis (HCC)    Chronic HFrEF (heart failure with reduced ejection fraction)  (HCC)    Colon cancer (HCC) 1992   Esophageal stricture    ESRD on hemodialysis (HCC)    ESRD due to HTN, started dialysis 2011 and gets HD at [facility] with Dr Lieutenant Reese on MWF schedule.  Access is LUA AVF as of Sept 2014.    GERD (gastroesophageal reflux disease)    High cholesterol 12/2011   History of blood transfusion 07/2011; 12/2011; 01/2012 X 2; 04/2012   History of gout    History of lower GI bleeding    Hypertension    Iron  deficiency anemia    Jugular vein occlusion, right (HCC)    Mitral regurgitation    a. Moderate by echo, 02/2012   Mitral valve disease    NSVT (nonsustained ventricular tachycardia) (HCC)    Ovarian cancer (HCC) 1992   PAF (paroxysmal atrial fibrillation) (HCC)    Pneumonia ~ 2009   PUD (peptic ulcer disease)    TIA (transient ischemic attack)    Tricuspid valve disease     Past Surgical History:  Procedure Laterality Date   A/V FISTULAGRAM Left 10/04/2022   Procedure: A/V Fistulagram;  Surgeon: Jackquelyn Mass, MD;  Location: [facility] INVASIVE CV LAB;  Service: Cardiovascular;  Laterality: Left;   A/V SHUNTOGRAM Left 03/19/2019   Procedure: A/V SHUNTOGRAM;  Surgeon: Jackquelyn Mass, MD;  Location: [facility] INVASIVE CV LAB;  Service: Cardiovascular;  Laterality: Left;   ABDOMINAL HYSTERECTOMY  1992   APPENDECTOMY  06/1990   AV FISTULA PLACEMENT  07/2009   left upper arm   AV FISTULA PLACEMENT Right 09/06/2016   Procedure: RIGHT FOREARM ARTERIOVENOUS (AV) GRAFT;  Surgeon: Richrd Char, MD;  [facility]: [facility] OR;  Service: Vascular;  Laterality: Right;   AV FISTULA PLACEMENT N/A 02/24/2017   Procedure: INSERTION OF ARTERIOVENOUS (AV) GORE-TEX GRAFT ARM (BRACHIAL AXILLARY);  Surgeon: Jackquelyn Mass, MD;  Location: [facility] ORS;  Service: Vascular;  Laterality: N/A;   AVGG REMOVAL Right 09/06/2016   Procedure: REMOVAL OF Right Arm ARTERIOVENOUS GORETEX GRAFT and Vein Patch angioplasty of brachial artery;  Surgeon: Dannis Dy, MD;  Location: [facility] OR;   Service: Vascular;  Laterality: Right;   BIOPSY  09/26/2019   Procedure: BIOPSY;  Surgeon: Yashica Corporal, MD;  [facility];  Service: Endoscopy;;   BIOPSY  11/01/2022   Procedure: BIOPSY;  Surgeon: Urban Garden, MD;  [facility];  Service: Gastroenterology;;   COLON RESECTION  1992   COLON SURGERY     COLONOSCOPY N/A 03/09/2019   Procedure: COLONOSCOPY;  Surgeon: Blayne Corporal, MD;  [facility];  Service: Endoscopy;  Laterality: N/A;   COLONOSCOPY N/A 08/11/2022   Procedure: COLONOSCOPY;  Surgeon: Nannette Babe, MD;  Location: [facility] ENDOSCOPY;  Service: Gastroenterology;  Laterality: N/A;   COLONOSCOPY WITH PROPOFOL  N/A 06/08/2021   Procedure: COLONOSCOPY WITH PROPOFOL ;  Surgeon: Urban Garden, MD;  [facility];  Service: Gastroenterology;  Laterality: N/A;  9:05 /Patient is on dialysis Mon Wed Fri   CORONARY ANGIOPLASTY WITH STENT PLACEMENT  12/15/11   "2"   CORONARY ANGIOPLASTY WITH STENT PLACEMENT  y/2013   "1; makes total of 3" (05/02/2012)   CORONARY ARTERY BYPASS GRAFT  06/13/2012   Procedure: CORONARY ARTERY BYPASS GRAFTING (CABG);  Surgeon: Norita Beauvais, MD;  Location: [name] OR;  Service: Open Heart Surgery;  Laterality: N/A;  cabg x four;  using left internal mammary artery, and left leg greater saphenous vein harvested endoscopically   CORONARY STENT INTERVENTION N/A 10/13/2016   Procedure: Coronary Stent Intervention;  Surgeon: Millicent Ally, MD;  [facility];  Service: Cardiovascular;  Laterality: N/A;   DIALYSIS/PERMA CATHETER REMOVAL N/A 04/18/2017   Procedure: DIALYSIS/PERMA CATHETER REMOVAL;  Surgeon: Jackquelyn Mass, MD;  Location: [facility] INVASIVE CV LAB;  Service: Cardiovascular;  Laterality: N/A;   DILATION AND CURETTAGE OF UTERUS     ENTEROSCOPY N/A 06/08/2021   Procedure: PUSH ENTEROSCOPY;  Surgeon: Urban Garden, MD;  [facility];  Service: Gastroenterology;   Laterality: N/A;   ENTEROSCOPY N/A 11/01/2022   Procedure: ENTEROSCOPY;  Surgeon: Urban Garden, MD;  [facility];  Service: Gastroenterology;  Laterality: N/A;   ENTEROSCOPY N/A 12/01/2022   Procedure: ENTEROSCOPY;  Surgeon: Ace Holder, MD;  Location: [facility] ENDOSCOPY;  Service: Gastroenterology;  Laterality: N/A;   ESOPHAGOGASTRODUODENOSCOPY  01/20/2012   Procedure: ESOPHAGOGASTRODUODENOSCOPY (EGD);  Surgeon: Asencion Blacksmith, MD,FACG;  Location: [facility] ENDOSCOPY;  Service: Endoscopy;  Laterality: N/A;   ESOPHAGOGASTRODUODENOSCOPY N/A 03/26/2013   Procedure: ESOPHAGOGASTRODUODENOSCOPY (EGD);  Surgeon: Tobin Forts, MD;  Location: [facility] ENDOSCOPY;  Service: Endoscopy;  Laterality: N/A;   ESOPHAGOGASTRODUODENOSCOPY N/A 04/30/2015   Procedure: ESOPHAGOGASTRODUODENOSCOPY (EGD);  Surgeon: Jace Corporal, MD;  [facility];  Service: Endoscopy;  Laterality: N/A;  1pm - moved to 10/20 @ 1:10   ESOPHAGOGASTRODUODENOSCOPY N/A 07/29/2016   Procedure: ESOPHAGOGASTRODUODENOSCOPY (EGD);  Surgeon: Danette Duos, MD;  Location: [facility] ENDOSCOPY;  Service: Gastroenterology;  Laterality: N/A;  enteroscopy   ESOPHAGOGASTRODUODENOSCOPY N/A 09/26/2019   Procedure: ESOPHAGOGASTRODUODENOSCOPY (EGD);  Surgeon: Tinamarie Corporal, MD;  [facility];  Service: Endoscopy;  Laterality: N/A;  1250   ESOPHAGOGASTRODUODENOSCOPY N/A 08/11/2022   Procedure: ESOPHAGOGASTRODUODENOSCOPY (EGD);  Surgeon: Nannette Babe, MD;  Location: [facility] ENDOSCOPY;  Service: Gastroenterology;  Laterality: N/A;   ESOPHAGOGASTRODUODENOSCOPY (EGD) WITH PROPOFOL  N/A 02/05/2021   Procedure: ESOPHAGOGASTRODUODENOSCOPY (EGD) WITH PROPOFOL ;  Surgeon: Vinetta Greening, DO;  [facility];  Service: Endoscopy;  Laterality: N/A;   ESOPHAGOGASTRODUODENOSCOPY (EGD) WITH PROPOFOL  N/A 08/27/2022   Procedure: ESOPHAGOGASTRODUODENOSCOPY (EGD) WITH PROPOFOL ;  Surgeon: Vinetta Greening, DO;  [facility];   Service: Endoscopy;  Laterality: N/A;   GIVENS CAPSULE STUDY N/A 03/07/2019   Procedure: GIVENS CAPSULE STUDY;  Surgeon: Makina Corporal, MD;  [facility];  Service: Endoscopy;  Laterality: N/A;  7:30   GIVENS CAPSULE STUDY N/A 04/22/2021   Procedure: GIVENS CAPSULE STUDY;  Surgeon: Rhyse Corporal, MD;  [facility];  Service: Endoscopy;  Laterality: N/A;  7:30   GIVENS CAPSULE STUDY N/A 08/27/2022   Procedure: GIVENS CAPSULE STUDY;  Surgeon: Vinetta Greening, DO;  [facility];  Service: Endoscopy;  Laterality: N/A;   HOT HEMOSTASIS N/A 08/11/2022   Procedure: HOT HEMOSTASIS (ARGON PLASMA COAGULATION/BICAP);  Surgeon: Nannette Babe, MD;  Location: [facility] ENDOSCOPY;  Service: Gastroenterology;  Laterality: N/A;   HOT HEMOSTASIS N/A 12/01/2022   Procedure: HOT HEMOSTASIS (ARGON PLASMA COAGULATION/BICAP);  Surgeon: Ace Holder, MD;  Location: [facility] ENDOSCOPY;  Service: Gastroenterology;  Laterality: N/A;   INSERTION OF DIALYSIS CATHETER N/A 10/05/2020   Procedure: ABORTED TUNNELED DIALYSIS CATHETER PLACEMENT RIGHT INTERNAL JUGULAR VEIN ;  Surgeon: Awilda Bogus, MD;  Location: [facility];  Service: General;  Laterality: N/A;   INTRAOPERATIVE TRANSESOPHAGEAL ECHOCARDIOGRAM  06/13/2012   Procedure: INTRAOPERATIVE TRANSESOPHAGEAL ECHOCARDIOGRAM;  Surgeon: Norita Beauvais, MD;  Location: [facility] OR;  Service: Open Heart Surgery;  Laterality: N/A;   IR DIALY SHUNT INTRO NEEDLE/INTRACATH INITIAL W/IMG LEFT Left 10/06/2020   IR FLUORO GUIDE CV LINE RIGHT  06/17/2020   IR FLUORO GUIDE CV LINE RIGHT  11/12/2022   IR GENERIC HISTORICAL  07/26/2016   IR FLUORO GUIDE CV LINE RIGHT 07/26/2016 Lucinda Saber, MD [facility]   IR GENERIC HISTORICAL  07/26/2016   IR US  GUIDE VASC ACCESS RIGHT 07/26/2016 Lucinda Saber, MD [facility]   IR GENERIC HISTORICAL  08/02/2016   IR US  GUIDE VASC ACCESS RIGHT 08/02/2016 Lucinda Saber, MD [facility]   IR GENERIC HISTORICAL  08/02/2016   IR  FLUORO GUIDE CV LINE RIGHT 08/02/2016 Lucinda Saber, MD [facility]   IR RADIOLOGY PERIPHERAL GUIDED IV START  03/28/2017   IR REMOVAL TUN CV CATH W/O FL  08/11/2020   IR REMOVAL TUN CV CATH W/O FL  11/15/2022   IR THROMBECTOMY AV FISTULA W/THROMBOLYSIS INC/SHUNT/IMG LEFT Left 06/17/2020   IR US  GUIDE VASC ACCESS LEFT  06/17/2020   IR US  GUIDE VASC ACCESS RIGHT  03/28/2017   IR US  GUIDE VASC ACCESS RIGHT  06/17/2020   IR US  GUIDE VASC ACCESS RIGHT  11/12/2022   LEFT HEART CATH AND CORONARY ANGIOGRAPHY N/A 09/20/2016   Procedure: Left Heart Cath and Coronary Angiography;  Surgeon: Arty Binning, MD;  Location: [facility] INVASIVE CV LAB;  Service: Cardiovascular;  Laterality: N/A;   LEFT HEART CATH AND CORS/GRAFTS ANGIOGRAPHY N/A 10/13/2016   Procedure: Left Heart Cath and Cors/Grafts Angiography;  Surgeon: Millicent Ally, MD;  [facility];  Service: Cardiovascular;  Laterality: N/A;   LEFT HEART CATH AND CORS/GRAFTS ANGIOGRAPHY N/A 07/13/2018   Procedure: LEFT HEART CATH AND CORS/GRAFTS ANGIOGRAPHY;  Surgeon: Swaziland, Peter M, MD;  Location: [facility] INVASIVE CV LAB;  Service: Cardiovascular;  Laterality: N/A;   LEFT HEART CATH AND CORS/GRAFTS ANGIOGRAPHY N/A 07/22/2021   Procedure: LEFT HEART CATH AND CORS/GRAFTS ANGIOGRAPHY;  Surgeon: Avanell Leigh, MD;  Location: [facility] INVAS[facility] CV LAB;  Service: Cardiovascular;  Laterality: N/A;   LEFT HEART CATHETERIZATION WITH CORONARY ANGIOGRAM N/A 12/15/2011   Procedure: LEFT HEART CATHETERIZATION WITH CORONARY ANGIOGRAM;  Surgeon: Odie Benne, MD;  Location: [facility] CATH LAB;  Service: Cardiovascular;  Laterality: N/A;   LEFT HEART CATHETERIZATION WITH CORONARY ANGIOGRAM N/A 01/10/2012   Procedure: LEFT HEART CATHETERIZATION WITH CORONARY ANGIOGRAM;  Surgeon: Peter M Swaziland, MD;  Location: [facility] CATH LAB;  Service: Cardiovascular;  Laterality: N/A;   LEFT HEART CATHETERIZATION WITH CORONARY ANGIOGRAM N/A 06/08/2012  Procedure: LEFT HEART CATHETERIZATION WITH  CORONARY ANGIOGRAM;  Surgeon: Odie Benne, MD;  Location: [facility] CATH LAB;  Service: Cardiovascular;  Laterality: N/A;   LEFT HEART CATHETERIZATION WITH CORONARY/GRAFT ANGIOGRAM N/A 12/10/2013   Procedure: LEFT HEART CATHETERIZATION WITH Estella Helling;  Surgeon: Lucendia Rusk, MD;  Location: [facility] CATH LAB;  Service: Cardiovascular;  Laterality: N/A;   OVARY SURGERY     ovarian cancer   POLYPECTOMY  03/09/2019   Procedure: POLYPECTOMY;  Surgeon: Nekisha Corporal, MD;  [facility];  Service: Endoscopy;;  cecal    POLYPECTOMY N/A 09/26/2019   Procedure: DUODENAL POLYPECTOMY;  Surgeon: Cailee Corporal, MD;  [facility];  Service: Endoscopy;  Laterality: N/A;   POLYPECTOMY  08/11/2022   Procedure: POLYPECTOMY;  Surgeon: Nannette Babe, MD;  Location: [facility] ENDOSCOPY;  Service: Gastroenterology;;   REVISION OF ARTERIOVENOUS GORETEX GRAFT N/A 02/24/2017   Procedure: REVISION OF ARTERIOVENOUS GORETEX GRAFT (RESECTION);  Surgeon: Jackquelyn Mass, MD;  Location: [facility] ORS;  Service: Vascular;  Laterality: N/A;   REVISON OF ARTERIOVENOUS FISTULA Left 06/19/2020   Procedure: REVISION OF LEFT UPPER ARM AV GRAFT WITH INTERPOSITION JUMP GRAFT USING GORE LIMB;  Surgeon: Young Hensen, MD;  Location: [facility] OR;  Service: Vascular;  Laterality: Left;   SHUNTOGRAM N/A 10/15/2013   Procedure: Fistulogram;  Surgeon: Margherita Shell, MD;  Location: [facility] CATH LAB;  Service: Cardiovascular;  Laterality: N/A;   SUBMUCOSAL TATTOO INJECTION  11/01/2022   Procedure: SUBMUCOSAL TATTOO INJECTION;  Surgeon: Urban Garden, MD;  [facility];  Service: Gastroenterology;;   THROMBECTOMY / ARTERIOVENOUS GRAFT REVISION  2011   left upper arm   TUBAL LIGATION  1980's   UPPER EXTREMITY ANGIOGRAPHY Bilateral 12/06/2016   Procedure: Upper Extremity Angiography;  Surgeon: Jackquelyn Mass, MD;  Location: [facility] INVASIVE CV LAB;  Service: Cardiovascular;  Laterality:  Bilateral;   UPPER EXTREMITY INTERVENTION Left 06/06/2017   Procedure: UPPER EXTREMITY INTERVENTION;  Surgeon: Jackquelyn Mass, MD;  Location: [facility] INVASIVE CV LAB;  Service: Cardiovascular;  Laterality: Left;   UPPER EXTREMITY VENOGRAPHY Left 10/04/2022   Procedure: UPPER EXTREMITY VENOGRAPHY;  Surgeon: Jackquelyn Mass, MD;  Location: [facility] INVASIVE CV LAB;  Service: Cardiovascular;  Laterality: Left;    Family History  Problem Relation Age of Onset   Heart disease Mother        Heart Disease before age 47   Hyperlipidemia Mother    Hypertension Mother    Diabetes Mother    Heart attack Mother    Heart disease Father        Heart Disease before age 105   Hyperlipidemia Father    Hypertension Father    Diabetes Father    Diabetes Sister    Hypertension Sister    Diabetes Brother    Hyperlipidemia Brother    Heart attack Brother    Hypertension Sister    Heart attack Brother    Colon cancer Child 88   Other Other        noncontributory for early CAD   Esophageal cancer Neg Hx    Liver disease Neg Hx    Kidney disease Neg Hx    Colon polyps Neg Hx     Allergies as of 11/28/2023 - Review Complete 11/09/2023  Allergen Reaction Noted   Amlodipine  Swelling 01/19/2015   Aspirin  Other (See Comments) 12/15/2011   Nitrofurantoin Hives 07/08/2009   Ranexa  [ranolazine ] Other (See Comments) 08/30/2020   Bactrim [sulfamethoxazole-trimethoprim]  Rash 12/15/2011   Iodinated contrast media Itching and Other (See Comments) 12/15/2011   Iron  Itching and Other (See Comments) 05/02/2012   Tylenol  [acetaminophen ] Itching and Other (See Comments) 07/08/2016   Gabapentin  Other (See Comments) 02/08/2017   Sucroferric oxyhydroxide Other (See Comments) 04/15/2021   Levaquin  [levofloxacin  in d5w] Rash 10/04/2013   Levofloxacin  Rash and Other (See Comments) 04/15/2017   Plavix  [clopidogrel  bisulfate] Rash 01/05/2012   Protonix  [pantoprazole  sodium] Rash 03/21/2013   Venofer [ferric oxide]  Itching and Other (See Comments) 05/02/2012    Social History   Socioeconomic History   Marital status: Married    Spouse name: Zyana Amaro   Number of children: 1   Years of education: Not on file   Highest education level: GED or equivalent  Occupational History   Occupation: retired- Architectural technologist- sewed  Tobacco Use   Smoking status: Never   Smokeless tobacco: Never  Vaping Use   Vaping status: Never Used  Substance and Sexual Activity   Alcohol  use: No    Alcohol /week: 0.0 standard drinks of alcohol    Drug use: No   Sexual activity: Not Currently    Birth control/protection: Surgical  Other Topics Concern   Not on file  Social History Narrative   Lives in [place], [place] with husband.  Dialysis pt - [facility].   Social Drivers of Corporate investment banker Strain: Low Risk  (12/14/2018)   Overall Financial Resource Strain (CARDIA)    Difficulty of Paying Living Expenses: Not very hard  Food Insecurity: No Food Insecurity (11/09/2023)   Hunger Vital Sign    Worried About Running Out of Food in the Last Year: Never true    Ran Out of Food in the Last Year: Never true  Transportation Needs: No Transportation Needs (11/09/2023)   PRAPARE - Administrator, Civil Service (Medical): No    Lack of Transportation (Non-Medical): No  Physical Activity: Unknown (12/14/2018)   Exercise Vital Sign    Days of Exercise per Week: Patient declined    Minutes of Exercise per Session: Patient declined  Stress: No Stress Concern Present (12/14/2018)   [employer] of Occupational Health - Occupational Stress Questionnaire    Feeling of Stress : Only a little  Social Connections: Socially Integrated (11/09/2023)   Social Connection and Isolation Panel [NHANES]    Frequency of Communication with Friends and Family: More than three times a week    Frequency of Social Gatherings with Friends and Family: More than three times a week    Attends Religious Services: More than 4  times per year    Active Member of [employer] or Organizations: Yes    Attends Engineer, structural: More than 4 times per year    Marital Status: Married     Review of Systems   Gen: Denies fever, chills, anorexia. Denies fatigue, weakness, weight loss.  CV: Denies chest pain, palpitations, syncope, peripheral edema, and claudication. Resp: Denies dyspnea at rest, cough, wheezing, coughing up blood, and pleurisy. GI: See HPI Derm: Denies rash, itching, dry skin Psych: Denies depression, anxiety, memory loss, confusion. No homicidal or suicidal ideation.  Heme: Denies bruising, bleeding, and enlarged lymph nodes.  Physical Exam   There were no vitals taken for this visit.  General:   Alert and oriented. No distress noted. Pleasant and cooperative.  Head:  Normocephalic and atraumatic. Eyes:  Conjuctiva clear without scleral icterus. Mouth:  Oral mucosa pink and moist. Good dentition. No lesions. Lungs:  Clear to auscultation bilaterally. No wheezes, rales, or rhonchi. No distress.  Heart:  S1, S2 present without murmurs appreciated.  Abdomen:  +BS, soft, non-tender and non-distended. No rebound or guarding. No HSM or masses noted. Rectal: *** Msk:  Symmetrical without gross deformities. Normal posture. Extremities:  Without edema. Neurologic:  Alert and  oriented x4 Psych:  Alert and cooperative. Normal mood and affect.  Assessment  Shawna [name] is a 83 y.o. female with a history of *** presenting today with    PLAN   *** CBC Continue monthly octreotide   Hematology referral? For further management of AVM bleeding and as needed lod transfusions     Julian Obey, MSN, FNP-BC, AGACNP-BC [facility]

## 2023-11-28 ENCOUNTER — Encounter: Payer: Self-pay | Admitting: Gastroenterology

## 2023-11-28 ENCOUNTER — Inpatient Hospital Stay: Admitting: Gastroenterology

## 2023-11-30 ENCOUNTER — Telehealth: Payer: Self-pay

## 2023-11-30 ENCOUNTER — Other Ambulatory Visit: Payer: Self-pay

## 2023-11-30 ENCOUNTER — Ambulatory Visit: Attending: Medical | Admitting: Medical

## 2023-11-30 ENCOUNTER — Encounter: Payer: Self-pay | Admitting: Medical

## 2023-11-30 ENCOUNTER — Encounter (HOSPITAL_COMMUNITY): Payer: Self-pay

## 2023-11-30 ENCOUNTER — Emergency Department (HOSPITAL_COMMUNITY)
Admission: EM | Admit: 2023-11-30 | Discharge: 2023-12-01 | Disposition: A | Discharge status: Home / Self Care | Attending: Student | Admitting: Student

## 2023-11-30 ENCOUNTER — Other Ambulatory Visit (HOSPITAL_COMMUNITY)
Admission: RE | Admit: 2023-11-30 | Discharge: 2023-11-30 | Disposition: A | Discharge status: Home / Self Care | Source: Ambulatory Visit | Attending: Medical | Admitting: Medical

## 2023-11-30 VITALS — BP 132/60 | HR 44 | Ht 61.0 in | Wt 112.0 lb

## 2023-11-30 DIAGNOSIS — R7989 Other specified abnormal findings of blood chemistry: Secondary | ICD-10-CM | POA: Diagnosis present

## 2023-11-30 DIAGNOSIS — D631 Anemia in chronic kidney disease: Secondary | ICD-10-CM | POA: Insufficient documentation

## 2023-11-30 DIAGNOSIS — R001 Bradycardia, unspecified: Secondary | ICD-10-CM | POA: Diagnosis not present

## 2023-11-30 DIAGNOSIS — E876 Hypokalemia: Secondary | ICD-10-CM | POA: Insufficient documentation

## 2023-11-30 DIAGNOSIS — E039 Hypothyroidism, unspecified: Secondary | ICD-10-CM | POA: Insufficient documentation

## 2023-11-30 DIAGNOSIS — I132 Hypertensive heart and chronic kidney disease with heart failure and with stage 5 chronic kidney disease, or end stage renal disease: Secondary | ICD-10-CM | POA: Insufficient documentation

## 2023-11-30 DIAGNOSIS — Z8616 Personal history of COVID-19: Secondary | ICD-10-CM | POA: Diagnosis not present

## 2023-11-30 DIAGNOSIS — D638 Anemia in other chronic diseases classified elsewhere: Secondary | ICD-10-CM

## 2023-11-30 DIAGNOSIS — K922 Gastrointestinal hemorrhage, unspecified: Secondary | ICD-10-CM

## 2023-11-30 DIAGNOSIS — I5022 Chronic systolic (congestive) heart failure: Secondary | ICD-10-CM | POA: Diagnosis not present

## 2023-11-30 DIAGNOSIS — Z79899 Other long term (current) drug therapy: Secondary | ICD-10-CM

## 2023-11-30 DIAGNOSIS — Z85038 Personal history of other malignant neoplasm of large intestine: Secondary | ICD-10-CM | POA: Insufficient documentation

## 2023-11-30 DIAGNOSIS — I480 Paroxysmal atrial fibrillation: Secondary | ICD-10-CM

## 2023-11-30 DIAGNOSIS — I10 Essential (primary) hypertension: Secondary | ICD-10-CM

## 2023-11-30 DIAGNOSIS — N186 End stage renal disease: Secondary | ICD-10-CM | POA: Diagnosis not present

## 2023-11-30 DIAGNOSIS — Z951 Presence of aortocoronary bypass graft: Secondary | ICD-10-CM | POA: Insufficient documentation

## 2023-11-30 DIAGNOSIS — I2510 Atherosclerotic heart disease of native coronary artery without angina pectoris: Secondary | ICD-10-CM

## 2023-11-30 DIAGNOSIS — D649 Anemia, unspecified: Secondary | ICD-10-CM | POA: Insufficient documentation

## 2023-11-30 DIAGNOSIS — Z992 Dependence on renal dialysis: Secondary | ICD-10-CM | POA: Diagnosis not present

## 2023-11-30 DIAGNOSIS — R030 Elevated blood-pressure reading, without diagnosis of hypertension: Secondary | ICD-10-CM

## 2023-11-30 DIAGNOSIS — I495 Sick sinus syndrome: Secondary | ICD-10-CM

## 2023-11-30 DIAGNOSIS — E1122 Type 2 diabetes mellitus with diabetic chronic kidney disease: Secondary | ICD-10-CM | POA: Insufficient documentation

## 2023-11-30 DIAGNOSIS — N185 Chronic kidney disease, stage 5: Secondary | ICD-10-CM

## 2023-11-30 LAB — COMPREHENSIVE METABOLIC PANEL WITH GFR
ALT: 26 U/L (ref 0–44)
AST: 37 U/L (ref 15–41)
Albumin: 2.9 g/dL — ABNORMAL LOW (ref 3.5–5.0)
Alkaline Phosphatase: 214 U/L — ABNORMAL HIGH (ref 38–126)
Anion gap: 10 (ref 5–15)
BUN: 31 mg/dL — ABNORMAL HIGH (ref 8–23)
CO2: 28 mmol/L (ref 22–32)
Calcium: 8.1 mg/dL — ABNORMAL LOW (ref 8.9–10.3)
Chloride: 100 mmol/L (ref 98–111)
Creatinine, Ser: 3.71 mg/dL — ABNORMAL HIGH (ref 0.44–1.00)
GFR, Estimated: 12 mL/min — ABNORMAL LOW (ref >60)
Glucose, Bld: 167 mg/dL — ABNORMAL HIGH (ref 70–99)
Potassium: 2.9 mmol/L — ABNORMAL LOW (ref 3.5–5.1)
Sodium: 138 mmol/L (ref 135–145)
Total Bilirubin: 0.9 mg/dL (ref 0.0–1.2)
Total Protein: 6.5 g/dL (ref 6.5–8.1)

## 2023-11-30 LAB — CBC
HCT: 19.9 % — ABNORMAL LOW (ref 36.0–46.0)
HCT: 19.8 % — ABNORMAL LOW (ref 36.0–46.0)
Hemoglobin: 6.4 g/dL — CL (ref 12.0–15.0)
Hemoglobin: 6.4 g/dL — CL (ref 12.0–15.0)
MCH: 35.8 pg — ABNORMAL HIGH (ref 26.0–34.0)
MCH: 36 pg — ABNORMAL HIGH (ref 26.0–34.0)
MCHC: 32.2 g/dL (ref 30.0–36.0)
MCHC: 32.3 g/dL (ref 30.0–36.0)
MCV: 111.2 fL — ABNORMAL HIGH (ref 80.0–100.0)
MCV: 111.2 fL — ABNORMAL HIGH (ref 80.0–100.0)
Platelets: 159 10*3/uL (ref 150–400)
Platelets: 161 10*3/uL (ref 150–400)
RBC: 1.79 MIL/uL — ABNORMAL LOW (ref 3.87–5.11)
RBC: 1.78 MIL/uL — ABNORMAL LOW (ref 3.87–5.11)
RDW: 29.5 % — ABNORMAL HIGH (ref 11.5–15.5)
RDW: 29.1 % — ABNORMAL HIGH (ref 11.5–15.5)
WBC: 4.3 10*3/uL (ref 4.0–10.5)
WBC: 3.9 10*3/uL — ABNORMAL LOW (ref 4.0–10.5)
nRBC: 0 % (ref 0.0–0.2)
nRBC: 0 % (ref 0.0–0.2)

## 2023-11-30 LAB — PREPARE RBC (CROSSMATCH)

## 2023-11-30 LAB — LIPASE, BLOOD: Lipase: 25 U/L (ref 11–51)

## 2023-11-30 MED ORDER — SODIUM CHLORIDE 0.9% IV SOLUTION: Freq: Once | INTRAVENOUS | Status: DC

## 2023-11-30 MED ORDER — METOPROLOL SUCCINATE ER 25 MG PO TB24: 25.0000 mg | ORAL_TABLET | Freq: Two times a day (BID) | ORAL | 3 refills | Status: DC

## 2023-11-30 NOTE — Telephone Encounter (Signed)
 Direct transfer to triage, [facility] calling with critical lab result.  Hemoglobin 6.4  Patient saw Cadence [name], PA-C today, will forward to [facility] triage pool to follow-up on.

## 2023-11-30 NOTE — Patient Instructions (Signed)
 Medication Instructions:  Your physician has recommended you make the following change in your medication:   Decrease Metoprolol  to 25 mg Two Times Daily Take an extra 25 mg for heart rate of 100.   *If you need a refill on your cardiac medications before your next appointment, please call your pharmacy*  Lab Work: Your physician recommends that you return for lab work. (CBC)    If you have labs (blood work) drawn today and your tests are completely normal, you will receive your results only by: MyChart Message (if you have MyChart) OR A paper copy in the mail If you have any lab test that is abnormal or we need to change your treatment, we will call you to review the results.  Testing/Procedures: NONE   Follow-Up: At [facility], you and your health needs are our priority.  As part of our continuing mission to provide you with exceptional heart care, our providers are all part of one team.  This team includes your primary Cardiologist (physician) and Advanced Practice Providers or APPs (Physician Assistants and Nurse Practitioners) who all work together to provide you with the care you need, when you need it.  Your next appointment:   1 month(s)  Provider:   You may see Armida Lander, MD or one of the following Advanced Practice Providers on your designated Care Team:   Woodfin Hays, PA-C  [place], [place] Theotis Flake, [place]     We recommend signing up for the patient portal called "MyChart".  Sign up information is provided on this After Visit Summary.  MyChart is used to connect with patients for Virtual Visits (Telemedicine).  Patients are able to view lab/test results, encounter notes, upcoming appointments, etc.  Non-urgent messages can be sent to your provider as well.   To learn more about what you can do with MyChart, go to [email or web address].   Other Instructions Thank you for choosing [facility]!

## 2023-11-30 NOTE — ED Triage Notes (Signed)
 Pt husband reports:  Low blood count Needs blood transfusion MWF dialysis Last treatment 11/29/23  Abdomen pain Started today

## 2023-11-30 NOTE — Progress Notes (Signed)
 Cardiology Office Note:  .   Date:  11/30/2023  ID:  [name], DOB October 06, 1940, MRN [id number] PCP: [facility] Providers Cardiologist:  Armida Lander, MD {  History of Present Illness: .   Shawna [name] is a 83 y.o. female with a h/o ESRD on HD, CAD s/p CABG 2013, DES to D1 in April 2023, HFrEF, LBBB, diabetes, HTN,SSS with symptomatic bradycardia and PAF not on a/c due to recurrent GIB, GERD, h/o TIA, colon CA, esophageal stricture who is being seen for hospital follow-up.   She has h/o Afib and aflutter and bradycardia. She saw general cardiology in January for significant bradycardia and amiodarone  and metoprolol  were stopped. She was readmitted 09/2023 with Afib with RVR and amiodarone  and metoprolol  were both restarted. She was admitted 11/03/23 from dialysis for low Hgb 6.9 but in the ED 8.3. hemoccult positive and she was bradycardic. Amio and metoprolol  were stopped and she went into Afib RVR. Amio and metoprolol  were restarted and increased with poor control and she was transferred to [facility] for EP consult for PPM. EP  stopped amiodarone  and Lopressor  was added. She was not felt to be a candidate for PPM or watchman due to comorbidites.   Today, the patient is overall OK. At home she uses a cane and or walker. Seh has been eating and drinking OK. She has lost some weight. She denies heart racing or fluttering. She feels occasionally tired and weak, this has been going on since before going to the hospital. She denies bleeding issues.   Studies Reviewed: Shawna [name]   EKG Interpretation Date/Time:  Thursday Nov 30 2023 14:56:03 EDT Ventricular Rate:  43 PR Interval:  200 QRS Duration:  130 QT Interval:  530 QTC Calculation: 447 R Axis:   -13  Text Interpretation: Marked sinus bradycardia Left bundle branch block When compared with ECG of 09-Nov-2023 16:26, Previous ECG has undetermined rhythm, needs review Minimal criteria for Anteroseptal infarct are no  longer Present Confirmed by Shawna [name], Shawna [name] ([id number]) on 11/30/2023 3:19:03 PM    Echo 08/2023 IMPRESSIONS     1. Left ventricular ejection fraction, by estimation, is 45 to 50%. The  left ventricle has mildly decreased function. The left ventricle  demonstrates regional wall motion abnormalities (see scoring  diagram/findings for description). Left ventricular  diastolic parameters are consistent with Grade II diastolic dysfunction  (pseudonormalization). Elevated left atrial pressure.   2. Right ventricular systolic function is moderately reduced. The right  ventricular size is normal. There is moderately elevated pulmonary artery  systolic pressure.   3. Left atrial size was moderately dilated.   4. The mitral valve is normal in structure. Mild mitral valve  regurgitation. No evidence of mitral stenosis.   5. The aortic valve is tricuspid. There is moderate calcification of the  aortic valve. Aortic valve regurgitation is not visualized. No aortic  stenosis is present.   6. The inferior vena cava is normal in size with greater than 50%  respiratory variability, suggesting right atrial pressure of 3 mmHg.    LHC 07/2021    Mid LM to Dist LM lesion is 60% stenosed.   Prox LAD lesion is 90% stenosed.   Ost Cx to Mid Cx lesion is 99% stenosed.   Ost RCA to Prox RCA lesion is 100% stenosed.   Ost Ramus to Ramus lesion is 80% stenosed.   Ost 1st Diag to 1st Diag lesion is 100% stenosed.   Ost RPDA to RPDA lesion  is 75% stenosed.   2nd Mrg lesion is 60% stenosed.   Dist LAD lesion is 70% stenosed.   LIMA and is large.   SVG and is large.   SVG and is normal in caliber.   SVG and is large.   The graft exhibits no disease.   The graft exhibits no disease.   The graft exhibits no disease.   LV end diastolic pressure is mildly elevated.   The left ventricular ejection fraction is 35-45% by visual estimate.   Shawna [name] is a 83 y.o. female  Echo 07/2021  1. Left ventricular ejection  fraction, by estimation, is 30 to 35%. The  left ventricle has moderately decreased function. The left ventricle  demonstrates regional wall motion abnormalities (see scoring  diagram/findings for description). There is mild  left ventricular hypertrophy. Left ventricular diastolic parameters are  consistent with Grade II diastolic dysfunction (pseudonormalization).  Elevated left atrial pressure.   2. Right ventricular systolic function is moderately reduced. The right  ventricular size is normal. There is mildly elevated pulmonary artery  systolic pressure. The estimated right ventricular systolic pressure is  43.4 mmHg.   3. Left atrial size was severely dilated.   4. Right atrial size was mildly dilated.   5. The mitral valve is degenerative. Moderate mitral valve regurgitation.  Mild to moderate mitral stenosis. MG at 70bpm, MVA 1.2 cm^2 by  continuity equation. Moderate mitral annular calcification.   6. Tricuspid valve regurgitation is mild to moderate.   7. The aortic valve is tricuspid. Aortic valve regurgitation is not  visualized. Aortic valve sclerosis/calcification is present, without any  evidence of aortic stenosis.   8. The inferior vena cava is normal in size with greater than 50%  respiratory variability, suggesting right atrial pressure of 3 mmHg.    Echo 03/2021 1. Left ventricular ejection fraction, by estimation, is 50%. The left  ventricle has low normal function. The left ventricle has no regional wall  motion abnormalities. There is mild left ventricular hypertrophy. Left  ventricular diastolic parameters are   consistent with Grade II diastolic dysfunction (pseudonormalization).  Elevated left atrial pressure.   2. Right ventricular systolic function is normal. The right ventricular  size is normal. There is moderately elevated pulmonary artery systolic  pressure.   3. Left atrial size was severely dilated.   4. Right atrial size was mildly dilated.    5. The mitral valve is abnormal. Moderate mitral valve regurgitation.  Mild mitral stenosis. Moderate mitral annular calcification.   6. The tricuspid valve is abnormal. Tricuspid valve regurgitation is  moderate.   7. The aortic valve is tricuspid. There is moderate calcification of the  aortic valve. There is moderate thickening of the aortic valve. Aortic  valve regurgitation is not visualized. No aortic stenosis is present.   8. The inferior vena cava is normal in size with greater than 50%  respiratory variability, suggesting right atrial pressure of 3 mmHg.       Physical Exam:   VS:  BP 132/60 (BP Location: Right Arm, Patient Position: Sitting, Cuff Size: Normal)   Pulse (!) 44   Ht 5\' 1"  (1.549 m)   Wt 112 lb (50.8 kg)   SpO2 100%   BMI 21.16 kg/m    Wt Readings from Last 3 Encounters:  11/30/23 112 lb (50.8 kg)  11/13/23 108 lb 0.4 oz (49 kg)  10/09/23 126 lb 5.2 oz (57.3 kg)    GEN: Well nourished, well developed in  no acute distress NECK: No JVD; No carotid bruits CARDIAC: bradycardia, RR, no murmurs, rubs, gallops RESPIRATORY:  Clear to auscultation without rales, wheezing or rhonchi  ABDOMEN: Soft, non-tender, non-distended EXTREMITIES:  No edema; No deformity   ASSESSMENT AND PLAN: .    SSS with bradycardia/afib RVR Seen recently by EP. She is not on a/c due to recurrent GIBs. Symptoms mostly driven by rate. Amiodarone  was stopped and was continued on lopressor . Goal Hr at rest<110. She was not felt to be a good PPM or Watchman candidate. EKG today shows SB with HR 43bpm. I will decrease metoprolol  25mg  BID, can take an extra if HR is high. Bring her back in 1 month to check HR/EKG.  Chronic HFrEF Most recent echo showed improved LVEF 45-50%, G2DD, elevated left atrial pressure, moderately reduced RV function, moderately elevated pulmonary artery pressure, moderately dilated left atrium, mild MR. Patient appears euvolemic. Volume managed per HD.   CAD s/p CABG  in 2013 Last cath in 2023 showed patent grafts. She denies chest pain.   ESRD on HD She does HD M,W,F.   Chronic Anemia Recurrent GIB Long history of recurrent GI bleeds requiring transfusions. Recurrent issues with bleeding GI AVMs. Not candidate for A/c. She denies bleeding issues. CBC today.  GOC She is DNR.  HTN BP is normal, continue Imdur  and metoprolol .  Valvular heart disease Echo showed mild MR and mod MS        Dispo: follow-up in 1 month  Signed, Nuriya Stuck Rebekah Canada, PA-C

## 2023-11-30 NOTE — Telephone Encounter (Signed)
 Shawna Sakai, RN called and spoke with husband. Informed husband that pt's hemoglobin is 6.4. instructed  husband to take pt to the closet ER for evaluation immediately. Pt's husband states that Shawna [name] called and told him to take pt to the ER for hemoglobin of 6.8. Shawna [name] notified and stated that pt needs to be seen in the ER. Husband hesitant about taking pt to ER stating that they will send her home. Pt husband voiced understanding. And states that he wants to be seen in [facility] ED. Will forward to [place], [place].

## 2023-11-30 NOTE — Telephone Encounter (Signed)
 Calling the on call Odessa Bene at [phone]. Left msg to call back.

## 2023-11-30 NOTE — ED Notes (Signed)
 Per blood bank, patient's blood will have to come from a different facility, so administration will be delayed

## 2023-12-01 ENCOUNTER — Ambulatory Visit: Payer: Self-pay | Admitting: Medical

## 2023-12-01 ENCOUNTER — Ambulatory Visit: Admitting: Medical

## 2023-12-01 MED ORDER — POTASSIUM CHLORIDE 20 MEQ PO PACK
40.0000 meq | PACK | Freq: Once | ORAL | Status: AC
  Administered 2023-12-01: 40 meq via ORAL
  Filled 2023-12-01: qty 2

## 2023-12-01 MED ORDER — HYDROXYZINE HCL 25 MG PO TABS
25.0000 mg | ORAL_TABLET | Freq: Once | ORAL | Status: AC
  Administered 2023-12-01: 25 mg via ORAL
  Filled 2023-12-01: qty 1

## 2023-12-01 NOTE — ED Notes (Signed)
 Patient visitor given recliner, blanket, and pillow

## 2023-12-01 NOTE — ED Provider Notes (Signed)
 [place] EMERGENCY DEPARTMENT AT [facility] Provider Note  CSN: [id number] Arrival date & time: 11/30/23 1916  Chief Complaint(s) Abnormal Labs  HPI Shawna [name] is a 83 y.o. female with PMH AVMs with recurrent GI bleeding, frequent symptomatic anemia, CHF, CAD status post CABG, paroxysmal A-fib, ESRD on hemodialysis Monday Wednesday Friday, T2DM, colon cancer status post resection 1992 who presents emergency room for evaluation of abnormal labs.  Patient was seen at the cardiologist and at dialysis today where she was found to have a hemoglobin less than 7 and sent to the emergency department for blood transfusion.  She has been seen multiple times in both the emergency department and with hospital admissions for symptomatic anemia.  Recently discharged on 11/14/2023 for similar presentation and was evaluated by GI at that time were additional intervention was ultimately deferred.  She currently endorses fatigue but denies chest pain, shortness of breath, abdominal pain, nausea, vomiting or other systemic symptoms.   Past Medical History Past Medical History:  Diagnosis Date   Acute on chronic respiratory failure with hypoxia (HCC) 10/10/2016   Anxiety    Arthritis    AVM (arteriovenous malformation) of colon    CAD (coronary artery disease)    a. s/p CABG in 2013 b. DES to D1 in 10/2016. c. cath in 07/2018 showing patent grafts with occlusion of D1 at prior stent site and progression of PDA disease --> medical management recommended   Carotid artery disease (HCC)    a. 60-79% LICA, 03/2012    Chronic bronchitis (HCC)    Chronic HFrEF (heart failure with reduced ejection fraction) (HCC)    Colon cancer (HCC) 1992   Esophageal stricture    ESRD on hemodialysis (HCC)    ESRD due to HTN, started dialysis 2011 and gets HD [facility] with Dr Lieutenant Reese on MWF schedule.  Access is LUA AVF as of Sept 2014.    GERD (gastroesophageal reflux disease)    High cholesterol 12/2011    History of blood transfusion 07/2011; 12/2011; 01/2012 X 2; 04/2012   History of gout    History of lower GI bleeding    Hypertension    Iron  deficiency anemia    Jugular vein occlusion, right (HCC)    Mitral regurgitation    a. Moderate by echo, 02/2012   Mitral valve disease    NSVT (nonsustained ventricular tachycardia) (HCC)    Ovarian cancer (HCC) 1992   PAF (paroxysmal atrial fibrillation) (HCC)    Pneumonia ~ 2009   PUD (peptic ulcer disease)    TIA (transient ischemic attack)    Tricuspid valve disease    Patient Active Problem List   Diagnosis Date Noted   DNR (do not resuscitate) 11/04/2023   Atrial fibrillation with rapid ventricular response (HCC) 10/07/2023   GI bleed 10/03/2023   Hypoxia 08/24/2023   Severe sepsis (HCC) 08/20/2023   Pleural effusion 08/20/2023   Multifocal pneumonia 08/19/2023   Symptomatic bradycardia 08/05/2023   Generalized weakness 07/12/2023   Sinus bradycardia 07/11/2023   FTT (failure to thrive) in adult 06/04/2023   Symptomatic anemia 03/08/2023   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   Acute blood loss anemia 01/27/2023   Paroxysmal atrial flutter (HCC) 12/14/2022   Pressure injury of skin 12/01/2022   Rectal bleeding 11/30/2022   Generalized abdominal pain 11/10/2022   Ileus (HCC) 11/04/2022   Periumbilical abdominal pain 11/04/2022   Macrocytic anemia 10/29/2022   Anemia of chronic disease 10/29/2022   Upper GI bleed 10/29/2022  Iron  deficiency anemia due to chronic blood loss 08/26/2022   Benign neoplasm of transverse colon 08/11/2022   Benign neoplasm of descending colon 08/11/2022   Benign neoplasm of sigmoid colon 08/11/2022   Angiodysplasia of duodenum 08/11/2022   Delirium 04/22/2022   History of arteriovenous malformation (AVM) 04/22/2022   Acute metabolic encephalopathy 11/09/2021   NSTEMI, initial episode of care (HCC) 10/19/2021   Acute on chronic blood loss anemia 09/09/2021   Ischemic cardiomyopathy     Atrial fibrillation, chronic (HCC) 05/07/2021   Neuropathy 04/23/2021   Anemia associated with chronic renal failure 04/13/2021   Left knee pain 03/15/2021   Moderate protein-calorie malnutrition (HCC) 02/27/2021   Hypokalemia 02/27/2021   COVID-19 virus infection 02/03/2021   Leukocytosis 02/03/2021   Hypoalbuminemia due to protein-calorie malnutrition (HCC) 02/03/2021   Acquired hypothyroidism 02/03/2021   Myositis 12/03/2020   Ataxia 12/02/2020   Diabetes mellitus type 2 in nonobese (HCC) 11/10/2020   Failure to thrive in adult 11/09/2020   Dialysis AV fistula malfunction, initial encounter (HCC)    Jugular vein occlusion, right (HCC)    Failure of surgically constructed arteriovenous fistula (HCC) 10/03/2020   Myoclonus 08/31/2020   Clotted renal dialysis AV graft, initial encounter (HCC)    Hemodialysis-associated hypotension    Irritable bowel syndrome 02/25/2020   Pulmonary edema 10/14/2019   Adenomatous duodenal polyp 09/10/2019   History of GI bleed 09/10/2019   Acute respiratory failure with hypoxia (HCC) 12/25/2018   Elevated troponin 12/14/2018   Hand steal syndrome (HCC) 08/01/2017   Anemia 07/14/2017   CAD (coronary artery disease) 06/05/2017   Intestinal ischemia (HCC)    Complication of vascular access for dialysis 03/19/2017   Preoperative clearance 01/25/2017   H/O non-ST elevation myocardial infarction (NSTEMI) 10/24/2016   Non-ST elevation (NSTEMI) myocardial infarction [facility])    Heme positive stool    Cardiac arrest [facility])    Palliative care encounter    Goals of care, counseling/discussion    Flash pulmonary edema (HCC) 04/06/2016   History of colon cancer 01/27/2016   History of ovarian cancer 01/27/2016   Paroxysmal atrial fibrillation (HCC) 10/14/2015   Malignant neoplasm of right ovary (HCC) 10/14/2015   Wide-complex tachycardia 09/08/2015   SVT (supraventricular tachycardia) (HCC) 09/08/2015   Hypertensive urgency 05/04/2015   Essential  hypertension    Dyspnea    Constipation 03/12/2013   Abdominal wall pain in left flank 03/12/2013   Occlusion and stenosis of carotid artery without mention of cerebral infarction 01/24/2013   Hx of CABG 07/05/2012   Carotid artery disease (HCC) 07/05/2012   Mitral regurgitation 06/12/2012   Non-STEMI (non-ST elevated myocardial infarction) (HCC) 06/08/2012   Chronic combined systolic and diastolic CHF (congestive heart failure) (HCC) 05/02/2012   AVM (arteriovenous malformation) of small bowel, acquired with hemorrhage 01/20/2012   GERD (gastroesophageal reflux disease) 01/09/2012   Mixed hyperlipidemia 01/05/2012   Atherosclerotic heart disease of native coronary artery without angina pectoris 12/16/2011   ESRD on hemodialysis (HCC) 12/16/2011   Anxiety disorder 05/04/2011   End-stage renal disease on hemodialysis (HCC) 04/29/2011   Gout 04/29/2011   Home Medication(s) Prior to Admission medications   Medication Sig Start Date End Date Taking? Authorizing Provider  albuterol  (PROVENTIL ) (2.5 MG/3ML) 0.083% nebulizer solution USE 1 VIAL IN NEBULIZER 4 TIMES DAILY AS NEEDED FOR COUGH, WHEEZING, AND SHORTNESS OF BREATH 10/20/23   [provider]  albuterol  (VENTOLIN  HFA) 108 (90 Base) MCG/ACT inhaler SMARTSIG:2 Puff(s) By Mouth Every 4-6 Hours PRN 10/10/23   [provider]  benzonatate  (TESSALON ) 100 MG capsule Take 1 capsule (100 mg total) by mouth 2 (two) times daily as needed for cough. 11/05/23   Rayfield Cairo, MD  calcitRIOL  (ROCALTROL ) 0.25 MCG capsule Take 7 capsules (1.75 mcg total) by mouth every Monday, Wednesday, and Friday with hemodialysis. 12/21/22   Lesa Rape, MD  cinacalcet  (SENSIPAR ) 30 MG tablet Take 1 tablet (30 mg total) by mouth every Monday, Wednesday, and Friday with hemodialysis. 12/19/22   Lesa Rape, MD  cyanocobalamin  (VITAMIN B12) 1000 MCG/ML injection Inject 1,000 mcg into the muscle every 30 (thirty) days.    [provider]   famotidine  (PEPCID ) 20 MG tablet Take 1 tablet (20 mg total) by mouth daily as needed for heartburn or indigestion. 09/10/23 09/09/24  Johnson, Clanford L, MD  folic acid  (FOLVITE ) 1 MG tablet Take 1 mg by mouth daily. 05/24/22   [provider]  guaifenesin  (ROBITUSSIN) 100 MG/5ML syrup Take 200 mg by mouth 3 (three) times daily as needed for cough.    [provider]  hydrOXYzine  (ATARAX ) 25 MG tablet Take 25 mg by mouth every 6 (six) hours as needed for itching. 06/06/22   [provider]  isosorbide  mononitrate (IMDUR ) 30 MG 24 hr tablet Take 1 tablet (30 mg total) by mouth daily. 11/15/23   Oral Billings, MD  levETIRAcetam  (KEPPRA ) 500 MG tablet Take 1 tablet (500 mg total) by mouth daily. 11/15/22   Cala Castleman, MD  levothyroxine  (SYNTHROID ) 50 MCG tablet Take 50 mcg by mouth daily. 08/27/23   [provider]  metoprolol  succinate (TOPROL  XL) 25 MG 24 hr tablet Take 1 tablet (25 mg total) by mouth 2 (two) times daily. Take an extra 25 mg for heart rate over 100 11/30/23   Furth, Cadence H, PA-C  multivitamin (RENA-VIT) TABS tablet Take 1 tablet by mouth daily.    [provider]  nitroGLYCERIN  (NITROSTAT ) 0.4 MG SL tablet Place 0.4 mg under the tongue every 5 (five) minutes as needed for chest pain.    [provider]  octreotide  (SANDOSTATIN  LAR) 20 MG injection Inject 20 mg into the muscle every 28 (twenty-eight) days. 11/15/22   Singh, Prashant K, MD  ondansetron  (ZOFRAN -ODT) 4 MG disintegrating tablet Take 4 mg by mouth every 8 (eight) hours as needed for nausea or vomiting. 05/22/23   [provider]  rosuvastatin  (CRESTOR ) 10 MG tablet Take 1 tablet by mouth once daily 06/06/22   Strader, Dimple Francis, PA-C                                                                                                                                    Past Surgical History Past Surgical History:  Procedure Laterality Date   A/V FISTULAGRAM Left  10/04/2022   Procedure: A/V Fistulagram;  Surgeon: Jackquelyn Mass, MD;  Location: [facility] INVASIVE CV LAB;  Service: Cardiovascular;  Laterality: Left;   A/V  SHUNTOGRAM Left 03/19/2019   Procedure: A/V SHUNTOGRAM;  Surgeon: Jackquelyn Mass, MD;  Location: [facility] INVASIVE CV LAB;  Service: Cardiovascular;  Laterality: Left;   ABDOMINAL HYSTERECTOMY  1992   APPENDECTOMY  06/1990   AV FISTULA PLACEMENT  07/2009   left upper arm   AV FISTULA PLACEMENT Right 09/06/2016   Procedure: RIGHT FOREARM ARTERIOVENOUS (AV) GRAFT;  Surgeon: Richrd Char, MD;  Location: [facility] OR;  Service: Vascular;  Laterality: Right;   AV FISTULA PLACEMENT N/A 02/24/2017   Procedure: INSERTION OF ARTERIOVENOUS (AV) GORE-TEX GRAFT ARM (BRACHIAL AXILLARY);  Surgeon: Jackquelyn Mass, MD;  Location: [facility] ORS;  Service: Vascular;  Laterality: N/A;   AVGG REMOVAL Right 09/06/2016   Procedure: REMOVAL OF Right Arm ARTERIOVENOUS GORETEX GRAFT and Vein Patch angioplasty of brachial artery;  Surgeon: Dannis Dy, MD;  Location: [facility] OR;  Service: Vascular;  Laterality: Right;   BIOPSY  09/26/2019   Procedure: BIOPSY;  Surgeon: Jamaira Corporal, MD;  Location: [facility] ENDO SUITE;  Service: Endoscopy;;   BIOPSY  11/01/2022   Procedure: BIOPSY;  Surgeon: Urban Garden, MD;  [facility]: [facility] ENDO SU[facility];  Service: Gastroenterology;;   COLON RESECTION  1992   COLON SURGERY     COLONOSCOPY N/A 03/09/2019   Procedure: COLONOSCOPY;  Surgeon: Dorean Corporal, MD;  [facility]: [facility] ENDO [facility];  Service: Endoscopy;  Laterality: N/A;   COLONOSCOPY N/A 08/11/2022   Procedure: COLONOSCOPY;  Surgeon: Nannette Babe, MD;  Location: [facility] ENDOSCOPY;  Service: Gastroenterology;  Laterality: N/A;   COLONOSCOPY WITH PROPOFOL  N/A 06/08/2021   Procedure: COLONOSCOPY WITH PROPOFOL ;  Surgeon: Urban Garden, MD;  [facility]: [facility] ENDO [facility];  Service: Gastroenterology;  Laterality: N/A;  9:05 /Patient is on dialysis Mon Wed Fri   CORONARY ANGIOPLASTY  WITH STENT PLACEMENT  12/15/11   "2"   CORONARY ANGIOPLASTY WITH STENT PLACEMENT  y/2013   "1; makes total of 3" (05/02/2012)   CORONARY ARTERY BYPASS GRAFT  06/13/2012   Procedure: CORONARY ARTERY BYPASS GRAFTING (CABG);  Surgeon: Norita Beauvais, MD;  Location: [facility] OR;  Service: Open Heart Surgery;  Laterality: N/A;  cabg x four;  using left internal mammary artery, and left leg greater saphenous vein harvested endoscopically   CORONARY STENT INTERVENTION N/A 10/13/2016   Procedure: Coronary Stent Intervention;  Surgeon: Millicent Ally, MD;  Location: [facility] INVASIVE CV LAB;  Service: Cardiovascular;  Laterality: N/A;   DIALYSIS/PERMA CATHETER REMOVAL N/A 04/18/2017   Procedure: DIALYSIS/PERMA CATHETER REMOVAL;  Surgeon: Jackquelyn Mass, MD;  Location: [facility] INVASIVE CV LAB;  Service: Cardiovascular;  Laterality: N/A;   DILATION AND CURETTAGE OF UTERUS     ENTEROSCOPY N/A 06/08/2021   Procedure: PUSH ENTEROSCOPY;  Surgeon: Urban Garden, MD;  [facility]: [facility];  Service: Gastroenterology;  Laterality: N/A;   ENTEROSCOPY N/A 11/01/2022   Procedure: ENTEROSCOPY;  Surgeon: Urban Garden, MD;  [facility]: [facility] ENDO [facility];  Service: Gastroenterology;  Laterality: N/A;   ENTEROSCOPY N/A 12/01/2022   Procedure: ENTEROSCOPY;  Surgeon: Ace Holder, MD;  Location: [facility] ENDOSCOPY;  Service: Gastroenterology;  Laterality: N/A;   ESOPHAGOGASTRODUODENOSCOPY  01/20/2012   Procedure: ESOPHAGOGASTRODUODENOSCOPY (EGD);  Surgeon: Asencion Blacksmith, MD,FACG;  Location: [facility] ENDOSCOPY;  Service: Endoscopy;  Laterality: N/A;   ESOPHAGOGASTRODUODENOSCOPY N/A 03/26/2013   Procedure: ESOPHAGOGASTRODUODENOSCOPY (EGD);  Surgeon: Tobin Forts, MD;  Location: [facility] ENDOSCOPY;  Service: Endoscopy;  Laterality: N/A;   ESOPHAGOGASTRODUODENOSCOPY N/A 04/30/2015   Procedure: ESOPHAGOGASTRODUODENOSCOPY (EGD);  Surgeon: Eduardo Corporal,  MD;  [facility];  Service: Endoscopy;  Laterality: N/A;  1pm -  moved to 10/20 @ 1:10   ESOPHAGOGASTRODUODENOSCOPY N/A 07/29/2016   Procedure: ESOPHAGOGASTRODUODENOSCOPY (EGD);  Surgeon: Danette Duos, MD;  Location: [facility] ENDOSCOPY;  Service: Gastroenterology;  Laterality: N/A;  enteroscopy   ESOPHAGOGASTRODUODENOSCOPY N/A 09/26/2019   Procedure: ESOPHAGOGASTRODUODENOSCOPY (EGD);  Surgeon: Eviana Corporal, MD;  [facility];  Service: Endoscopy;  Laterality: N/A;  1250   ESOPHAGOGASTRODUODENOSCOPY N/A 08/11/2022   Procedure: ESOPHAGOGASTRODUODENOSCOPY (EGD);  Surgeon: Nannette Babe, MD;  Location: [facility] ENDOSCOPY;  Service: Gastroenterology;  Laterality: N/A;   ESOPHAGOGASTRODUODENOSCOPY (EGD) WITH PROPOFOL  N/A 02/05/2021   Procedure: ESOPHAGOGASTRODUODENOSCOPY (EGD) WITH PROPOFOL ;  Surgeon: Vinetta Greening, DO;  [facility];  Service: Endoscopy;  Laterality: N/A;   ESOPHAGOGASTRODUODENOSCOPY (EGD) WITH PROPOFOL  N/A 08/27/2022   Procedure: ESOPHAGOGASTRODUODENOSCOPY (EGD) WITH PROPOFOL ;  Surgeon: Vinetta Greening, DO;  [facility];  Service: Endoscopy;  Laterality: N/A;   GIVENS CAPSULE STUDY N/A 03/07/2019   Procedure: GIVENS CAPSULE STUDY;  Surgeon: Denice Corporal, MD;  [facility];  Service: Endoscopy;  Laterality: N/A;  7:30   GIVENS CAPSULE STUDY N/A 04/22/2021   Procedure: GIVENS CAPSULE STUDY;  Surgeon: Javia Corporal, MD;  [facility];  Service: Endoscopy;  Laterality: N/A;  7:30   GIVENS CAPSULE STUDY N/A 08/27/2022   Procedure: GIVENS CAPSULE STUDY;  Surgeon: Vinetta Greening, DO;  [facility];  Service: Endoscopy;  Laterality: N/A;   HOT HEMOSTASIS N/A 08/11/2022   Procedure: HOT HEMOSTASIS (ARGON PLASMA COAGULATION/BICAP);  Surgeon: Nannette Babe, MD;  Location: [facility] ENDOSCOPY;  Service: Gastroenterology;  Laterality: N/A;   HOT HEMOSTASIS N/A 12/01/2022   Procedure: HOT HEMOSTASIS (ARGON PLASMA COAGULATION/BICAP);  Surgeon: Ace Holder, MD;  Location: [facility] ENDOSCOPY;   Service: Gastroenterology;  Laterality: N/A;   INSERTION OF DIALYSIS CATHETER N/A 10/05/2020   Procedure: ABORTED TUNNELED DIALYSIS CATHETER PLACEMENT RIGHT INTERNAL JUGULAR VEIN ;  Surgeon: Awilda Bogus, MD;  Location: [facility] ORS;  Service: General;  Laterality: N/A;   INTRAOPERATIVE TRANSESOPHAGEAL ECHOCARDIOGRAM  06/13/2012   Procedure: INTRAOPERATIVE TRANSESOPHAGEAL ECHOCARDIOGRAM;  Surgeon: Norita Beauvais, MD;  Location: [facility] OR;  Service: Open Heart Surgery;  Laterality: N/A;   IR DIALY SHUNT INTRO NEEDLE/INTRACATH INITIAL W/IMG LEFT Left 10/06/2020   IR FLUORO GUIDE CV LINE RIGHT  06/17/2020   IR FLUORO GUIDE CV LINE RIGHT  11/12/2022   IR GENERIC HISTORICAL  07/26/2016   IR FLUORO GUIDE CV LINE RIGHT 07/26/2016 Lucinda Saber, MD [facility]   IR GENERIC HISTORICAL  07/26/2016   IR US  GUIDE VASC ACCESS RIGHT 07/26/2016 Lucinda Saber, MD [facility]   IR GENERIC HISTORICAL  08/02/2016   IR US  GUIDE VASC ACCESS RIGHT 08/02/2016 Lucinda Saber, MD [facility]   IR GENERIC HISTORICAL  08/02/2016   IR FLUORO GUIDE CV LINE RIGHT 08/02/2016 Lucinda Saber, MD [facility]   IR RADIOLOGY PERIPHERAL GUIDED IV START  03/28/2017   IR REMOVAL TUN CV CATH W/O FL  08/11/2020   IR REMOVAL TUN CV CATH W/O FL  11/15/2022   IR THROMBECTOMY AV FISTULA W/THROMBOLYSIS INC/SHUNT/IMG LEFT Left 06/17/2020   IR US  GUIDE VASC ACCESS LEFT  06/17/2020   IR US  GUIDE VASC ACCESS RIGHT  03/28/2017   IR US  GUIDE VASC ACCESS RIGHT  06/17/2020   IR US  GUIDE VASC ACCESS RIGHT  11/12/2022   LEFT HEART CATH AND CORONARY ANGIOGRAPHY N/A 09/20/2016  Procedure: Left Heart Cath and Coronary Angiography;  Surgeon: Arty Binning, MD;  Location: [facility] INVASIVE CV LAB;  Service: Cardiovascular;  Laterality: N/A;   LEFT HEART CATH AND CORS/GRAFTS ANGIOGRAPHY N/A 10/13/2016   Procedure: Left Heart Cath and Cors/Grafts Angiography;  Surgeon: Millicent Ally, MD;  Location: [facility] INVASIVE CV LAB;  Service: Cardiovascular;  Laterality: N/A;   LEFT HEART  CATH AND CORS/GRAFTS ANGIOGRAPHY N/A 07/13/2018   Procedure: LEFT HEART CATH AND CORS/GRAFTS ANGIOGRAPHY;  Surgeon: Swaziland, Peter M, MD;  Location: [facility] INVASIVE CV LAB;  Service: Cardiovascular;  Laterality: N/A;   LEFT HEART CATH AND CORS/GRAFTS ANGIOGRAPHY N/A 07/22/2021   Procedure: LEFT HEART CATH AND CORS/GRAFTS ANGIOGRAPHY;  Surgeon: Avanell Leigh, MD;  Location: [facility] INVASIVE CV LAB;  Service: Cardiovascular;  Laterality: N/A;   LEFT HEART CATHETERIZATION WITH CORONARY ANGIOGRAM N/A 12/15/2011   Procedure: LEFT HEART CATHETERIZATION WITH CORONARY ANGIOGRAM;  Surgeon: Odie Benne, MD;  Location: [facility] CATH LAB;  Service: Cardiovascular;  Laterality: N/A;   LEFT HEART CATHETERIZATION WITH CORONARY ANGIOGRAM N/A 01/10/2012   Procedure: LEFT HEART CATHETERIZATION WITH CORONARY ANGIOGRAM;  Surgeon: Peter M Swaziland, MD;  Location: [facility] CATH LAB;  Service: Cardiovascular;  Laterality: N/A;   LEFT HEART CATHETERIZATION WITH CORONARY ANGIOGRAM N/A 06/08/2012   Procedure: LEFT HEART CATHETERIZATION WITH CORONARY ANGIOGRAM;  Surgeon: Odie Benne, MD;  Location: [facility] CATH LAB;  Service: Cardiovascular;  Laterality: N/A;   LEFT HEART CATHETERIZATION WITH CORONARY/GRAFT ANGIOGRAM N/A 12/10/2013   Procedure: LEFT HEART CATHETERIZATION WITH Estella Helling;  Surgeon: Lucendia Rusk, MD;  Location: [facility] CATH LAB;  Service: Cardiovascular;  Laterality: N/A;   OVARY SURGERY     ovarian cancer   POLYPECTOMY  03/09/2019   Procedure: POLYPECTOMY;  Surgeon: Juliani Corporal, MD;  [facility];  Service: Endoscopy;;  cecal    POLYPECTOMY N/A 09/26/2019   Procedure: DUODENAL POLYPECTOMY;  Surgeon: Brogan Corporal, MD;  Location: [facility] ENDO [facility];  Service: Endoscopy;  Laterality: N/A;   POLYPECTOMY  08/11/2022   Procedure: POLYPECTOMY;  Surgeon: Nannette Babe, MD;  Location: [facility] ENDOSCOPY;  Service: Gastroenterology;;   REVISION OF ARTERIOVENOUS GORETEX GRAFT N/A 02/24/2017   Procedure:  REVISION OF ARTERIOVENOUS GORETEX GRAFT (RESECTION);  Surgeon: Jackquelyn Mass, MD;  Location: [facility] ORS;  Service: Vascular;  Laterality: N/A;   REVISON OF ARTERIOVENOUS FISTULA Left 06/19/2020   Procedure: REVISION OF LEFT UPPER ARM AV GRAFT WITH INTERPOSITION JUMP GRAFT USING GORE LIMB;  Surgeon: Young Hensen, MD;  Location: [facility] OR;  Service: Vascular;  Laterality: Left;   SHUNTOGRAM N/A 10/15/2013   Procedure: Fistulogram;  Surgeon: Margherita Shell, MD;  Location: [facility] CATH LAB;  Service: Cardiovascular;  Laterality: N/A;   SUBMUCOSAL TATTOO INJECTION  11/01/2022   Procedure: SUBMUCOSAL TATTOO INJECTION;  Surgeon: Urban Garden, MD;  Location: [facility];  Service: Gastroenterology;;   THROMBECTOMY / ARTERIOVENOUS GRAFT REVISION  2011   left upper arm   TUBAL LIGATION  1980's   UPPER EXTREMITY ANGIOGRAPHY Bilateral 12/06/2016   Procedure: Upper Extremity Angiography;  Surgeon: Jackquelyn Mass, MD;  Location: [facility] INVASIVE CV LAB;  Service: Cardiovascular;  Laterality: Bilateral;   UPPER EXTREMITY INTERVENTION Left 06/06/2017   Procedure: UPPER EXTREMITY INTERVENTION;  Surgeon: Jackquelyn Mass, MD;  Location: [facility] INVASIVE CV LAB;  Service: Cardiovascular;  Laterality: Left;   UPPER EXTREMITY VENOGRAPHY Left 10/04/2022   Procedure: UPPER EXTREMITY VENOGRAPHY;  Surgeon: Jackquelyn Mass, MD;  Location: [facility]  INVASIVE CV LAB;  Service: Cardiovascular;  Laterality: Left;   Family History Family History  Problem Relation Age of Onset   Heart disease Mother        Heart Disease before age 80   Hyperlipidemia Mother    Hypertension Mother    Diabetes Mother    Heart attack Mother    Heart disease Father        Heart Disease before age 10   Hyperlipidemia Father    Hypertension Father    Diabetes Father    Diabetes Sister    Hypertension Sister    Diabetes Brother    Hyperlipidemia Brother    Heart attack Brother    Hypertension Sister    Heart attack  Brother    Colon cancer Child 55   Other Other        noncontributory for early CAD   Esophageal cancer Neg Hx    Liver disease Neg Hx    Kidney disease Neg Hx    Colon polyps Neg Hx     Social History Social History   Tobacco Use   Smoking status: Never   Smokeless tobacco: Never  Vaping Use   Vaping status: Never Used  Substance Use Topics   Alcohol  use: No    Alcohol /week: 0.0 standard drinks of alcohol    Drug use: No   Allergies Amlodipine , Aspirin , Nitrofurantoin, Ranexa  [ranolazine ], Bactrim [sulfamethoxazole-trimethoprim], Iodinated contrast media, Iron , Tylenol  [acetaminophen ], Gabapentin , Sucroferric oxyhydroxide, Levaquin  [levofloxacin  in d5w], Levofloxacin , Plavix  [clopidogrel  bisulfate], Protonix  [pantoprazole  sodium], and Venofer [ferric oxide]  Review of Systems Review of Systems  Constitutional:  Positive for fatigue.    Physical Exam Vital Signs  I have reviewed the triage vital signs BP (!) 170/72 (BP Location: Right Arm)   Pulse (!) 48   Temp 98.1 F (36.7 C) (Oral)   Resp 14   Ht 5\' 1"  (1.549 m)   Wt 50.8 kg   SpO2 95%   BMI 21.16 kg/m   Physical Exam Vitals and nursing note reviewed.  Constitutional:      General: She is not in acute distress.    Appearance: She is well-developed.  HENT:     Head: Normocephalic and atraumatic.  Eyes:     Conjunctiva/sclera: Conjunctivae normal.  Cardiovascular:     Rate and Rhythm: Normal rate and regular rhythm.     Heart sounds: No murmur heard. Pulmonary:     Effort: Pulmonary effort is normal. No respiratory distress.     Breath sounds: Normal breath sounds.  Abdominal:     Palpations: Abdomen is soft.     Tenderness: There is no abdominal tenderness.  Musculoskeletal:        General: No swelling.     Cervical back: Neck supple.  Skin:    General: Skin is warm and dry.     Capillary Refill: Capillary refill takes less than 2 seconds.     Coloration: Skin is pale.  Neurological:     Mental  Status: She is alert.  Psychiatric:        Mood and Affect: Mood normal.     ED Results and Treatments Labs (all labs ordered are listed, but only abnormal results are displayed) Labs Reviewed  COMPREHENSIVE METABOLIC PANEL WITH GFR - Abnormal; Notable for the following components:      Result Value   Potassium 2.9 (*)    Glucose, Bld 167 (*)    BUN 31 (*)    Creatinine, Ser 3.71 (*)  Calcium  8.1 (*)    Albumin  2.9 (*)    Alkaline Phosphatase 214 (*)    GFR, Estimated 12 (*)    All other components within normal limits  CBC - Abnormal; Notable for the following components:   WBC 3.9 (*)    RBC 1.78 (*)    Hemoglobin 6.4 (*)    HCT 19.8 (*)    MCV 111.2 (*)    MCH 36.0 (*)    RDW 29.1 (*)    All other components within normal limits  LIPASE, BLOOD  URINALYSIS, ROUTINE W REFLEX MICROSCOPIC  TYPE AND SCREEN  PREPARE RBC (CROSSMATCH)                                                                                                                          Radiology No results found.  Pertinent labs & imaging results that were available during my care of the patient were reviewed by me and considered in my medical decision making (see MDM for details).  Medications Ordered in ED Medications  0.9 %  sodium chloride  infusion (Manually program via Guardrails IV Fluids) (0 mLs Intravenous Hold 11/30/23 2130)  hydrOXYzine  (ATARAX ) tablet 25 mg (25 mg Oral Given 12/01/23 0417)  potassium chloride  (KLOR-CON ) packet 40 mEq (40 mEq Oral Given 12/01/23 0959)                                                                                                                                     Procedures .Critical Care  Performed by: Karlyn Overman, MD Authorized by: Karlyn Overman, MD   Critical care provider statement:    Critical care time (minutes):  30   Critical care was necessary to treat or prevent imminent or life-threatening deterioration of the following conditions:  Critical anemia requiring blood transfusion.   Critical care was time spent personally by me on the following activities:  Development of treatment plan with patient or surrogate, discussions with consultants, evaluation of patient's response to treatment, examination of patient, ordering and review of laboratory studies, ordering and review of radiographic studies, ordering and performing treatments and interventions, pulse oximetry, re-evaluation of patient's condition and review of old charts   (including critical care time)  Medical Decision Making / ED Course   This patient presents to the ED for concern of fatigue, this involves an extensive number of treatment options, and is a complaint that  carries with it a high risk of complications and morbidity.  The differential diagnosis includes symptomatic anemia, anemia of chronic disease, acute blood loss anemia, GI bleeding  MDM: Patient seen emergency room for evaluation of abnormal labs.  Physical exam with the pallor under the eyelids, bradycardia but is otherwise unremarkable.  Laboratory evaluation with a hemoglobin of 6.4 with an MCV of 111.12 potassium 2.9, BUN 31, creatinine 3.71.  She has had multiple hospital admissions for this and after shared decision-making discussion, we both agree that hospitalization has not always benefited the patient and she traditionally gets transfused, feels symptomatic improvement and ultimately goes home.  She would like to avoid hospital admission today if possible.  Thus, 2 units of packed red blood cells were ordered for the patient and currently pending admission at time of signout.  Please see provider signout note for continuation of workup   Additional history obtained: -Additional history obtained from husband -External records from outside source obtained and reviewed including: Chart review including previous notes, labs, imaging, consultation notes   Lab Tests: -I ordered, reviewed, and  interpreted labs.   The pertinent results include:   Labs Reviewed  COMPREHENSIVE METABOLIC PANEL WITH GFR - Abnormal; Notable for the following components:      Result Value   Potassium 2.9 (*)    Glucose, Bld 167 (*)    BUN 31 (*)    Creatinine, Ser 3.71 (*)    Calcium  8.1 (*)    Albumin  2.9 (*)    Alkaline Phosphatase 214 (*)    GFR, Estimated 12 (*)    All other components within normal limits  CBC - Abnormal; Notable for the following components:   WBC 3.9 (*)    RBC 1.78 (*)    Hemoglobin 6.4 (*)    HCT 19.8 (*)    MCV 111.2 (*)    MCH 36.0 (*)    RDW 29.1 (*)    All other components within normal limits  LIPASE, BLOOD  URINALYSIS, ROUTINE W REFLEX MICROSCOPIC  TYPE AND SCREEN  PREPARE RBC (CROSSMATCH)      EKG   EKG Interpretation Date/Time:  Thursday Nov 30 2023 20:02:22 EDT Ventricular Rate:  45 PR Interval:  210 QRS Duration:  136 QT Interval:  558 QTC Calculation: 483 R Axis:   114  Text Interpretation: Sinus bradycardia Left bundle branch block No significant change since last tracing Confirmed by Yuritzi Kamp ([id number]) on 12/01/2023 10:30:22 AM         Medicines ordered and prescription drug management: Meds ordered this encounter  Medications   0.9 %  sodium chloride  infusion (Manually program via Guardrails IV Fluids)   hydrOXYzine  (ATARAX ) tablet 25 mg   potassium chloride  (KLOR-CON ) packet 40 mEq    -I have reviewed the patients home medicines and have made adjustments as needed  Critical interventions Packed red blood cell administration   Cardiac Monitoring: The patient was maintained on a cardiac monitor.  I personally viewed and interpreted the cardiac monitored which showed an underlying rhythm of: Sinus bradycardia  Social Determinants of Health:  Factors impacting patients care include: none   Reevaluation: After the interventions noted above, I reevaluated the patient and found that they have :stayed the same  Co morbidities  that complicate the patient evaluation  Past Medical History:  Diagnosis Date   Acute on chronic respiratory failure with hypoxia (HCC) 10/10/2016   Anxiety    Arthritis    AVM (arteriovenous malformation) of colon  CAD (coronary artery disease)    a. s/p CABG in 2013 b. DES to D1 in 10/2016. c. cath in 07/2018 showing patent grafts with occlusion of D1 at prior stent site and progression of PDA disease --> medical management recommended   Carotid artery disease (HCC)    a. 60-79% LICA, 03/2012    Chronic bronchitis (HCC)    Chronic HFrEF (heart failure with reduced ejection fraction) (HCC)    Colon cancer (HCC) 1992   Esophageal stricture    ESRD on hemodialysis (HCC)    ESRD due to HTN, started dialysis 2011 and gets HD at [facility] with Dr Lieutenant Reese on MWF schedule.  Access is LUA AVF as of Sept 2014.    GERD (gastroesophageal reflux disease)    High cholesterol 12/2011   History of blood transfusion 07/2011; 12/2011; 01/2012 X 2; 04/2012   History of gout    History of lower GI bleeding    Hypertension    Iron  deficiency anemia    Jugular vein occlusion, right (HCC)    Mitral regurgitation    a. Moderate by echo, 02/2012   Mitral valve disease    NSVT (nonsustained ventricular tachycardia) (HCC)    Ovarian cancer (HCC) 1992   PAF (paroxysmal atrial fibrillation) (HCC)    Pneumonia ~ 2009   PUD (peptic ulcer disease)    TIA (transient ischemic attack)    Tricuspid valve disease       Dispostion: I considered admission for this patient, and patient pending completion of blood transfusions at time of signout.  Please see provider signout note for attenuation of workup.     Final Clinical Impression(s) / ED Diagnoses Final diagnoses:  Chronic anemia  Symptomatic anemia  Stage 5 chronic kidney disease not on chronic dialysis (HCC)  Hypokalemia  Sinus bradycardia  Elevated blood pressure reading  Essential hypertension     @PCDICTATION @    Temeca Somma, Alyse July,  MD 12/01/23 1031

## 2023-12-01 NOTE — Discharge Instructions (Addendum)
 It was our pleasure to provide your ER care today - we hope that you feel better.  Drink adequate fluids/stay well hydrated.   From today's labs, your blood count was lower and you were given a transfusion of 2 units of blood. Your potassium level is also low - follow up with your doctor.   Follow up closely with primary care doctor in the coming week.   Return to ER if worse, new symptoms, fevers, rectal bleeding/loss of blood, fainting, chest pain, increased trouble breathing, or other concern.

## 2023-12-01 NOTE — ED Provider Notes (Signed)
 Signed out by Dr Carylon Claude that patient is receiving 2 units prbc, and then is to be d/c to home.    RN indicates prbcs given. Pt is alert, content, no current c/o. Vitals stable.   Pt currently appears stable for ED d/c per Dr Medora Spiller plan.      Guadalupe Lee, MD 12/01/23 [phone]

## 2023-12-01 NOTE — ED Notes (Signed)
 Verified with blood bank status of blood arrival. Reports it is en route, but no exact ETA

## 2023-12-04 LAB — TYPE AND SCREEN
ABO/RH(D): O POS
Antibody Screen: NEGATIVE
Donor AG Type: NEGATIVE
Donor AG Type: NEGATIVE
Unit division: 0
Unit division: 0

## 2023-12-04 LAB — BPAM RBC
Blood Product Expiration Date: 202506172359
Blood Product Expiration Date: 202506182359
ISSUE DATE / TIME: 202505230547
Unit Type and Rh: 5100
Unit Type and Rh: 5100

## 2023-12-13 ENCOUNTER — Ambulatory Visit: Admitting: Student

## 2023-12-20 ENCOUNTER — Emergency Department (HOSPITAL_COMMUNITY)
Admission: EM | Admit: 2023-12-20 | Discharge: 2023-12-21 | Disposition: A | Discharge status: Home / Self Care | Attending: Emergency Medicine | Admitting: Emergency Medicine

## 2023-12-20 ENCOUNTER — Other Ambulatory Visit: Payer: Self-pay

## 2023-12-20 ENCOUNTER — Encounter (HOSPITAL_COMMUNITY): Payer: Self-pay

## 2023-12-20 DIAGNOSIS — E1122 Type 2 diabetes mellitus with diabetic chronic kidney disease: Secondary | ICD-10-CM | POA: Insufficient documentation

## 2023-12-20 DIAGNOSIS — Z79899 Other long term (current) drug therapy: Secondary | ICD-10-CM | POA: Insufficient documentation

## 2023-12-20 DIAGNOSIS — R7989 Other specified abnormal findings of blood chemistry: Secondary | ICD-10-CM | POA: Diagnosis present

## 2023-12-20 DIAGNOSIS — N186 End stage renal disease: Secondary | ICD-10-CM | POA: Diagnosis not present

## 2023-12-20 DIAGNOSIS — Z992 Dependence on renal dialysis: Secondary | ICD-10-CM | POA: Insufficient documentation

## 2023-12-20 DIAGNOSIS — I2510 Atherosclerotic heart disease of native coronary artery without angina pectoris: Secondary | ICD-10-CM | POA: Diagnosis not present

## 2023-12-20 DIAGNOSIS — I5022 Chronic systolic (congestive) heart failure: Secondary | ICD-10-CM | POA: Insufficient documentation

## 2023-12-20 DIAGNOSIS — Z85038 Personal history of other malignant neoplasm of large intestine: Secondary | ICD-10-CM | POA: Insufficient documentation

## 2023-12-20 DIAGNOSIS — I132 Hypertensive heart and chronic kidney disease with heart failure and with stage 5 chronic kidney disease, or end stage renal disease: Secondary | ICD-10-CM | POA: Insufficient documentation

## 2023-12-20 DIAGNOSIS — Z951 Presence of aortocoronary bypass graft: Secondary | ICD-10-CM | POA: Insufficient documentation

## 2023-12-20 DIAGNOSIS — D62 Acute posthemorrhagic anemia: Secondary | ICD-10-CM | POA: Insufficient documentation

## 2023-12-20 LAB — CBC WITH DIFFERENTIAL/PLATELET
Abs Immature Granulocytes: 0.01 10*3/uL (ref 0.00–0.07)
Basophils Absolute: 0.1 10*3/uL (ref 0.0–0.1)
Basophils Relative: 1 %
Eosinophils Absolute: 0.2 10*3/uL (ref 0.0–0.5)
Eosinophils Relative: 4 %
HCT: 19.7 % — ABNORMAL LOW (ref 36.0–46.0)
Hemoglobin: 6.2 g/dL — CL (ref 12.0–15.0)
Immature Granulocytes: 0 %
Lymphocytes Relative: 11 %
Lymphs Abs: 0.5 10*3/uL — ABNORMAL LOW (ref 0.7–4.0)
MCH: 34.1 pg — ABNORMAL HIGH (ref 26.0–34.0)
MCHC: 31.5 g/dL (ref 30.0–36.0)
MCV: 108.2 fL — ABNORMAL HIGH (ref 80.0–100.0)
Monocytes Absolute: 0.5 10*3/uL (ref 0.1–1.0)
Monocytes Relative: 10 %
Neutro Abs: 3.6 10*3/uL (ref 1.7–7.7)
Neutrophils Relative %: 74 %
Platelets: 203 10*3/uL (ref 150–400)
RBC: 1.82 MIL/uL — ABNORMAL LOW (ref 3.87–5.11)
WBC: 4.8 10*3/uL (ref 4.0–10.5)
nRBC: 0 % (ref 0.0–0.2)

## 2023-12-20 LAB — COMPREHENSIVE METABOLIC PANEL WITH GFR
ALT: 39 U/L (ref 0–44)
AST: 62 U/L — ABNORMAL HIGH (ref 15–41)
Albumin: 2.7 g/dL — ABNORMAL LOW (ref 3.5–5.0)
Alkaline Phosphatase: 196 U/L — ABNORMAL HIGH (ref 38–126)
Anion gap: 12 (ref 5–15)
BUN: 53 mg/dL — ABNORMAL HIGH (ref 8–23)
CO2: 21 mmol/L — ABNORMAL LOW (ref 22–32)
Calcium: 8.3 mg/dL — ABNORMAL LOW (ref 8.9–10.3)
Chloride: 105 mmol/L (ref 98–111)
Creatinine, Ser: 6.4 mg/dL — ABNORMAL HIGH (ref 0.44–1.00)
GFR, Estimated: 6 mL/min — ABNORMAL LOW (ref >60)
Glucose, Bld: 175 mg/dL — ABNORMAL HIGH (ref 70–99)
Potassium: 3.7 mmol/L (ref 3.5–5.1)
Sodium: 138 mmol/L (ref 135–145)
Total Bilirubin: 0.8 mg/dL (ref 0.0–1.2)
Total Protein: 6.1 g/dL — ABNORMAL LOW (ref 6.5–8.1)

## 2023-12-20 LAB — PREPARE RBC (CROSSMATCH)

## 2023-12-20 MED ORDER — SODIUM CHLORIDE 0.9% IV SOLUTION
Freq: Once | INTRAVENOUS | Status: AC
Start: 2023-12-20 — End: 2023-12-21

## 2023-12-20 NOTE — ED Notes (Signed)
 Phone call from lab stating blood will likely be a couple hours before available.

## 2023-12-20 NOTE — ED Notes (Signed)
 Pt not in room.

## 2023-12-20 NOTE — ED Triage Notes (Signed)
 Pt arrived via POV from home with her husband reporting her husband received a phone call yesterday regarding abnormal labs the Pt received. Pt advised to seek evaluation in the ER. Pt unsure what abnormal labs she had.

## 2023-12-20 NOTE — ED Provider Notes (Addendum)
 [place] EMERGENCY DEPARTMENT AT [facility] Provider Note   CSN: [id number] Arrival date & time: 12/20/23  1252     History  Chief Complaint  Patient presents with   abnormal labs    Shawna [name] is a 83 y.o. female with AVMs with recurrent GI bleeding, frequent symptomatic anemia, HFrEF, CAD status post CABG on ASA, paroxysmal A-fib, ESRD on hemodialysis Monday Wednesday Friday, T2DM, colon cancer status post resection 1992  who presents with abnormal Hgb, called back to ED. She has known history of acute on chronic blood loss anemia given history of AVMs requiring multiple admissions for transfusions. She has been seen multiple times in both the emergency department and with hospital admissions for symptomatic anemia. Recently discharged on 11/14/2023 for similar presentation and was evaluated by GI at that time were additional intervention was ultimately deferred. Seen on 12/01/23 and received 2U PRBCs for Hgb 6.4. On monthly long-acting octreotide  injeciton.  Today patient has no complaints other than fatigue. Denies f/c, SOB, CP, abd pain, urinary sxs. She was told to come d/t low Hgb again.    Past Medical History:  Diagnosis Date   Acute on chronic respiratory failure with hypoxia (HCC) 10/10/2016   Anxiety    Arthritis    AVM (arteriovenous malformation) of colon    CAD (coronary artery disease)    a. s/p CABG in 2013 b. DES to D1 in 10/2016. c. cath in 07/2018 showing patent grafts with occlusion of D1 at prior stent site and progression of PDA disease --> medical management recommended   Carotid artery disease (HCC)    a. 60-79% LICA, 03/2012    Chronic bronchitis (HCC)    Chronic HFrEF (heart failure with reduced ejection fraction) (HCC)    Colon cancer (HCC) 1992   Esophageal stricture    ESRD on hemodialysis (HCC)    ESRD due to HTN, started dialysis 2011 and gets HD at [facility] with Dr Lieutenant Reese on MWF schedule.  Access is LUA AVF as of Sept 2014.    GERD  (gastroesophageal reflux disease)    High cholesterol 12/2011   History of blood transfusion 07/2011; 12/2011; 01/2012 X 2; 04/2012   History of gout    History of lower GI bleeding    Hypertension    Iron  deficiency anemia    Jugular vein occlusion, right (HCC)    Mitral regurgitation    a. Moderate by echo, 02/2012   Mitral valve disease    NSVT (nonsustained ventricular tachycardia) (HCC)    Ovarian cancer (HCC) 1992   PAF (paroxysmal atrial fibrillation) (HCC)    Pneumonia ~ 2009   PUD (peptic ulcer disease)    TIA (transient ischemic attack)    Tricuspid valve disease        Home Medications Prior to Admission medications   Medication Sig Start Date End Date Taking? Authorizing Provider  albuterol  (PROVENTIL ) (2.5 MG/3ML) 0.083% nebulizer solution USE 1 VIAL IN NEBULIZER 4 TIMES DAILY AS NEEDED FOR COUGH, WHEEZING, AND SHORTNESS OF BREATH 10/20/23   [provider]  albuterol  (VENTOLIN  HFA) 108 (90 Base) MCG/ACT inhaler SMARTSIG:2 Puff(s) By Mouth Every 4-6 Hours PRN 10/10/23   [provider]  benzonatate  (TESSALON ) 100 MG capsule Take 1 capsule (100 mg total) by mouth 2 (two) times daily as needed for cough. 11/05/23   Rayfield Cairo, MD  calcitRIOL  (ROCALTROL ) 0.25 MCG capsule Take 7 capsules (1.75 mcg total) by mouth every Monday, Wednesday, and Friday with hemodialysis. 12/21/22  Lesa Rape, MD  cinacalcet  (SENSIPAR ) 30 MG tablet Take 1 tablet (30 mg total) by mouth every Monday, Wednesday, and Friday with hemodialysis. 12/19/22   Lesa Rape, MD  cyanocobalamin  (VITAMIN B12) 1000 MCG/ML injection Inject 1,000 mcg into the muscle every 30 (thirty) days.    [provider]  famotidine  (PEPCID ) 20 MG tablet Take 1 tablet (20 mg total) by mouth daily as needed for heartburn or indigestion. 09/10/23 09/09/24  Rayfield Cairo, MD  folic acid  (FOLVITE ) 1 MG tablet Take 1 mg by mouth daily. 05/24/22   [provider]  guaifenesin  (ROBITUSSIN) 100  MG/5ML syrup Take 200 mg by mouth 3 (three) times daily as needed for cough.    [provider]  hydrOXYzine  (ATARAX ) 25 MG tablet Take 25 mg by mouth every 6 (six) hours as needed for itching. 06/06/22   [provider]  isosorbide  mononitrate (IMDUR ) 30 MG 24 hr tablet Take 1 tablet (30 mg total) by mouth daily. 11/15/23   Oral Billings, MD  levETIRAcetam  (KEPPRA ) 500 MG tablet Take 1 tablet (500 mg total) by mouth daily. 11/15/22   Cala Castleman, MD  levothyroxine  (SYNTHROID ) 50 MCG tablet Take 50 mcg by mouth daily. 08/27/23   [provider]  metoprolol  succinate (TOPROL  XL) 25 MG 24 hr tablet Take 1 tablet (25 mg total) by mouth 2 (two) times daily. Take an extra 25 mg for heart rate over 100 11/30/23   Furth, Cadence H, PA-C  multivitamin (RENA-VIT) TABS tablet Take 1 tablet by mouth daily.    [provider]  nitroGLYCERIN  (NITROSTAT ) 0.4 MG SL tablet Place 0.4 mg under the tongue every 5 (five) minutes as needed for chest pain.    [provider]  octreotide  (SANDOSTATIN  LAR) 20 MG injection Inject 20 mg into the muscle every 28 (twenty-eight) days. 11/15/22   Singh, Prashant K, MD  ondansetron  (ZOFRAN -ODT) 4 MG disintegrating tablet Take 4 mg by mouth every 8 (eight) hours as needed for nausea or vomiting. 05/22/23   [provider]  rosuvastatin  (CRESTOR ) 10 MG tablet Take 1 tablet by mouth once daily 06/06/22   Strader, Brittany M, PA-C      Allergies    Amlodipine , Aspirin , Nitrofurantoin, Ranexa  [ranolazine ], Bactrim [sulfamethoxazole-trimethoprim], Gabapentin , Iodinated contrast media, Iron , Sucroferric oxyhydroxide, Tylenol  [acetaminophen ], Levaquin  [levofloxacin  in d5w], Levofloxacin , Plavix  [clopidogrel  bisulfate], Protonix  [pantoprazole  sodium], and Venofer [ferric oxide]    Review of Systems   Review of Systems A 10 point review of systems was performed and is negative unless otherwise reported in HPI.  Physical Exam Updated  Vital Signs BP (!) 184/73   Pulse (!) 53   Temp 97.9 F (36.6 C) (Oral)   Resp 18   Ht 5' 1 (1.549 m)   Wt 50.8 kg   SpO2 97%   BMI 21.16 kg/m  Physical Exam General: Normal appearing female, lying in bed.  HEENT: PERRLA, Sclera anicteric, MMM, trachea midline.  Cardiology: RRR, no murmurs/rubs/gallops. BL radial and DP pulses equal bilaterally.  Resp: Normal respiratory rate and effort. CTAB, no wheezes, rhonchi, crackles.  Abd: Soft, non-tender, non-distended. No rebound tenderness or guarding.  GU: Deferred. MSK: No peripheral edema or signs of trauma. Extremities without deformity or TTP. No cyanosis or clubbing. Skin: warm, dry. No rashes or lesions. Back: No CVA tenderness Neuro: A&Ox4, CNs II-XII grossly intact. MAEs. Sensation grossly intact.  Psych: Normal mood and affect.   ED Results / Procedures / Treatments   Labs (all labs ordered  are listed, but only abnormal results are displayed) Labs Reviewed  COMPREHENSIVE METABOLIC PANEL WITH GFR - Abnormal; Notable for the following components:      Result Value   CO2 21 (*)    Glucose, Bld 175 (*)    BUN 53 (*)    Creatinine, Ser 6.40 (*)    Calcium  8.3 (*)    Total Protein 6.1 (*)    Albumin  2.7 (*)    AST 62 (*)    Alkaline Phosphatase 196 (*)    GFR, Estimated 6 (*)    All other components within normal limits  CBC WITH DIFFERENTIAL/PLATELET - Abnormal; Notable for the following components:   RBC 1.82 (*)    Hemoglobin 6.2 (*)    HCT 19.7 (*)    MCV 108.2 (*)    MCH 34.1 (*)    Lymphs Abs 0.5 (*)    All other components within normal limits  TYPE AND SCREEN  PREPARE RBC (CROSSMATCH)    EKG EKG Interpretation Date/Time:  Wednesday December 20 2023 16:36:14 EDT Ventricular Rate:  48 PR Interval:  207 QRS Duration:  127 QT Interval:  534 QTC Calculation: 478 R Axis:   24  Text Interpretation: Sinus bradycardia Left bundle branch block Confirmed by Annita Kindle [phone]) on 12/20/2023 6:58:12  PM  Radiology No results found.  Procedures .Critical Care  Performed by: Merdis Stalling, MD Authorized by: Merdis Stalling, MD   Critical care provider statement:    Critical care time (minutes):  30   Critical care was necessary to treat or prevent imminent or life-threatening deterioration of the following conditions:  Circulatory failure   Critical care was time spent personally by me on the following activities:  Development of treatment plan with patient or surrogate, evaluation of patient's response to treatment, examination of patient, ordering and review of laboratory studies, ordering and performing treatments and interventions, pulse oximetry, re-evaluation of patient's condition, review of old charts and obtaining history from patient or surrogate     Medications Ordered in ED Medications  0.9 %  sodium chloride  infusion (Manually program via Guardrails IV Fluids) ( Intravenous New Bag/Given 12/20/23 2309)    ED Course/ Medical Decision Making/ A&P                          Medical Decision Making Amount and/or Complexity of Data Reviewed Labs: ordered. Decision-making details documented in ED Course.  Risk Prescription drug management.    This patient presents to the ED for concern of acute on chronic blood loss anemia, this involves an extensive number of treatment options, and is a complaint that carries with it a high risk of complications and morbidity.  I considered the following differential and admission for this acute, potentially life threatening condition.   MDM:    Pt with known h/o acute on chronic BLA d/t GIB d/t AVMs managed conservatively with blood tranfusions. No blood thinners. Hgb 6.2 today. Will order 2U PRBCs. She is mildly bradycardic but no CP and otherwise HDS.   Clinical Course as of 12/21/23 0011  Wed Dec 20, 2023  1550 Hemoglobin(!!): 6.2 [HN]  1638 Creatinine(!): 6.40 ESRD on HD [HN]  1734 Blood bank informed nurse that d/t  antibodies the blood will be a couple of hours [HN]  2246 Blood is still not arrived from the blood bank [HN]    Clinical Course User Index [HN] Merdis Stalling, MD    Labs: I  Ordered, and personally interpreted labs.  The pertinent results include:  those listed above Additional history obtained from chart review, husband at bedside.    Cardiac Monitoring: The patient was maintained on a cardiac monitor.  I personally viewed and interpreted the cardiac monitored which showed an underlying rhythm of: sinus bradycardia with LBBB  Reevaluation: After the interventions noted above, I reevaluated the patient and found that they have :improved  Social Determinants of Health: Lives independently  Disposition:  Patient is signed out to the oncoming ED physician Dr. Wallis Gun who is made aware of her history, presentation, exam, workup, and plan. Plan is to given 2U PRBCs and reeval, discharge.   Co morbidities that complicate the patient evaluation  Past Medical History:  Diagnosis Date   Acute on chronic respiratory failure with hypoxia (HCC) 10/10/2016   Anxiety    Arthritis    AVM (arteriovenous malformation) of colon    CAD (coronary artery disease)    a. s/p CABG in 2013 b. DES to D1 in 10/2016. c. cath in 07/2018 showing patent grafts with occlusion of D1 at prior stent site and progression of PDA disease --> medical management recommended   Carotid artery disease (HCC)    a. 60-79% LICA, 03/2012    Chronic bronchitis (HCC)    Chronic HFrEF (heart failure with reduced ejection fraction) (HCC)    Colon cancer (HCC) 1992   Esophageal stricture    ESRD on hemodialysis (HCC)    ESRD due to HTN, started dialysis 2011 and gets HD at [facility] with Dr Lieutenant Reese on MWF schedule.  Access is LUA AVF as of Sept 2014.    GERD (gastroesophageal reflux disease)    High cholesterol 12/2011   History of blood transfusion 07/2011; 12/2011; 01/2012 X 2; 04/2012   History of gout    History of  lower GI bleeding    Hypertension    Iron  deficiency anemia    Jugular vein occlusion, right (HCC)    Mitral regurgitation    a. Moderate by echo, 02/2012   Mitral valve disease    NSVT (nonsustained ventricular tachycardia) (HCC)    Ovarian cancer (HCC) 1992   PAF (paroxysmal atrial fibrillation) (HCC)    Pneumonia ~ 2009   PUD (peptic ulcer disease)    TIA (transient ischemic attack)    Tricuspid valve disease      Medicines Meds ordered this encounter  Medications   0.9 %  sodium chloride  infusion (Manually program via Guardrails IV Fluids)    I have reviewed the patients home medicines and have made adjustments as needed  Problem List / ED Course: Problem List Items Addressed This Visit       Other   Acute on chronic blood loss anemia - Primary         Merdis Stalling, MD 12/21/23 [phone]

## 2023-12-21 LAB — BPAM RBC
Blood Product Expiration Date: 202507142359
Blood Product Expiration Date: 202507142359
ISSUE DATE / TIME: 202506112302
ISSUE DATE / TIME: 202506120112
Unit Type and Rh: 5100
Unit Type and Rh: 5100

## 2023-12-21 NOTE — ED Provider Notes (Signed)
 Patient stable, no acute distress, received blood transfusion and no issues. Patient safe for discharge home   Eldon Greenland, MD 12/21/23 [phone]

## 2023-12-22 LAB — TYPE AND SCREEN
ABO/RH(D): O POS
Antibody Screen: NEGATIVE
Donor AG Type: NEGATIVE
Donor AG Type: NEGATIVE
Unit division: 0
Unit division: 0

## 2023-12-25 NOTE — Progress Notes (Signed)
  Electrophysiology Office Note:   Date:  12/26/2023  ID:  STARLEE CORRALEJO, DOB Jan 22, 1941, MRN [id number]  Primary Cardiologist: Armida Lander, MD Primary Heart Failure: None Electrophysiologist: Ardeen Kohler, MD      History of Present Illness:   Shawna [name] is a 83 y.o. female with h/o AF not on OAC due to recurrent GIB, CAD s/p CABG 2013, DES to D1 in April 2023, HFrEF, HTN, LBBB, ESRD on HD, GERD, TIA, colon CA, & esophageal strictures seen today for post hospital follow up.    Admitted 4/25-11/14/23 for recurrent GIB in setting of AVM's with ABLA.  She has chronic AF and EP saw for recommendations. She was felt not to be a Watchman candidate due to frailty and not an anticoagulation candidate due to recurrent GIB.  She was recommended for rate control only > metoprolol  succinate 37.5 mg BID.   Since discharge from hospital the patient reports doing she has good days and bad days.  Her husband accompanies her to the appointment.  She reports no bleeding since her last hospitalization.  She is unaware of any further events of AF.  Her largest concern is itching at night.  She denies chest pain, palpitations, dyspnea, PND, orthopnea, nausea, vomiting, dizziness, syncope, edema, weight gain, or early satiety.   Review of systems complete and found to be negative unless listed in HPI.   EP Information / Studies Reviewed:    EKG is not ordered today. EKG from 12/20/23 reviewed which showed SB with LBBB 48 bpm       Risk Assessment/Calculations:    CHA2DS2-VASc Score = 9   This indicates a 12.2% annual risk of stroke. The patient's score is based upon: CHF History: 1 HTN History: 1 Diabetes History: 1 Stroke History: 2 Vascular Disease History: 1 Age Score: 2 Gender Score: 1         Physical Exam:   VS:  BP (!) 169/57   Pulse (!) 55   Ht 5' 1 (1.549 m)   Wt 112 lb (50.8 kg) Comment: per pt and husband report  SpO2 93%   BMI 21.16 kg/m    Wt Readings from Last 3  Encounters:  12/26/23 112 lb (50.8 kg)  12/20/23 111 lb 15.9 oz (50.8 kg)  11/30/23 112 lb (50.8 kg)     GEN: Well nourished, well developed in no acute distress NECK: No JVD; No carotid bruits CARDIAC: Regular rate and rhythm, no murmurs, rubs, gallops RESPIRATORY:  Clear to auscultation without rales, wheezing or rhonchi  ABDOMEN: Soft, non-tender, non-distended EXTREMITIES:  No edema; No deformity   ASSESSMENT AND PLAN:    Tachy-Brady Syndrome  Atrial Flutter  Paroxysmal Atrial Fibrillation  -not an OAC candidate due to recurrent GIB  -continue Metoprolol  25 mg BID (was apparently reduced prior to discharge from hospital), patient was instructed to take an additional 25 mg as needed for heart racing -not a Watchman candidate due to recurrent bleeding / frailty  -EKG reviewed from 12/20/23 > SB with LBBB  GI AVM / Recurrent GIB  -per primary   Itching -Suspect related to renal disease, she is on Atarax  and Pepcid .  Defer to nephrology   Follow up with Dr. Daneil Dunker in 6 months  Signed, Creighton Doffing, NP-C, AGACNP-BC [facility] - Electrophysiology  12/26/2023, 5:04 PM

## 2023-12-26 ENCOUNTER — Encounter: Payer: Self-pay | Admitting: Pulmonary Disease

## 2023-12-26 ENCOUNTER — Ambulatory Visit: Attending: Pulmonary Disease | Admitting: Pulmonary Disease

## 2023-12-26 VITALS — BP 169/57 | HR 55 | Ht 61.0 in | Wt 112.0 lb

## 2023-12-26 DIAGNOSIS — I480 Paroxysmal atrial fibrillation: Secondary | ICD-10-CM

## 2023-12-26 DIAGNOSIS — I495 Sick sinus syndrome: Secondary | ICD-10-CM | POA: Diagnosis not present

## 2023-12-26 NOTE — Patient Instructions (Signed)
 Medication Instructions:  Your physician recommends that you continue on your current medications as directed. Please refer to the Current Medication list given to you today.  *If you need a refill on your cardiac medications before your next appointment, please call your pharmacy*  Lab Work: None ordered If you have labs (blood work) drawn today and your tests are completely normal, you will receive your results only by: MyChart Message (if you have MyChart) OR A paper copy in the mail If you have any lab test that is abnormal or we need to change your treatment, we will call you to review the results.  Follow-Up: At [facility], you and your health needs are our priority.  As part of our continuing mission to provide you with exceptional heart care, our providers are all part of one team.  This team includes your primary Cardiologist (physician) and Advanced Practice Providers or APPs (Physician Assistants and Nurse Practitioners) who all work together to provide you with the care you need, when you need it.  Your next appointment:   6 month(s)  Provider:   You may see Ardeen Kohler, MD or one of the following Advanced Practice Providers on your designated Care Team:   Mertha Abrahams, PA-C Michael Andy Tillery, PA-C Suzann Riddle, NP Creighton Doffing, NP

## 2024-01-01 ENCOUNTER — Other Ambulatory Visit: Payer: Self-pay

## 2024-01-01 ENCOUNTER — Inpatient Hospital Stay (HOSPITAL_COMMUNITY)
Admission: EM | Admit: 2024-01-01 | Discharge: 2024-01-11 | DRG: 070 | Disposition: A | Discharge status: Home / Self Care | Attending: Family Medicine | Admitting: Family Medicine

## 2024-01-01 ENCOUNTER — Encounter (HOSPITAL_COMMUNITY): Payer: Self-pay | Admitting: Emergency Medicine

## 2024-01-01 DIAGNOSIS — Z8774 Personal history of (corrected) congenital malformations of heart and circulatory system: Secondary | ICD-10-CM

## 2024-01-01 DIAGNOSIS — Z681 Body mass index (BMI) 19 or less, adult: Secondary | ICD-10-CM

## 2024-01-01 DIAGNOSIS — Z833 Family history of diabetes mellitus: Secondary | ICD-10-CM

## 2024-01-01 DIAGNOSIS — I5042 Chronic combined systolic (congestive) and diastolic (congestive) heart failure: Secondary | ICD-10-CM | POA: Diagnosis present

## 2024-01-01 DIAGNOSIS — Z83438 Family history of other disorder of lipoprotein metabolism and other lipidemia: Secondary | ICD-10-CM

## 2024-01-01 DIAGNOSIS — Z800 Family history of malignant neoplasm of digestive organs: Secondary | ICD-10-CM

## 2024-01-01 DIAGNOSIS — E876 Hypokalemia: Secondary | ICD-10-CM | POA: Diagnosis present

## 2024-01-01 DIAGNOSIS — Z66 Do not resuscitate: Secondary | ICD-10-CM | POA: Diagnosis present

## 2024-01-01 DIAGNOSIS — I25118 Atherosclerotic heart disease of native coronary artery with other forms of angina pectoris: Secondary | ICD-10-CM

## 2024-01-01 DIAGNOSIS — G40909 Epilepsy, unspecified, not intractable, without status epilepticus: Secondary | ICD-10-CM | POA: Diagnosis present

## 2024-01-01 DIAGNOSIS — K31811 Angiodysplasia of stomach and duodenum with bleeding: Principal | ICD-10-CM | POA: Diagnosis present

## 2024-01-01 DIAGNOSIS — G9341 Metabolic encephalopathy: Principal | ICD-10-CM | POA: Diagnosis present

## 2024-01-01 DIAGNOSIS — Z886 Allergy status to analgesic agent status: Secondary | ICD-10-CM

## 2024-01-01 DIAGNOSIS — D649 Anemia, unspecified: Principal | ICD-10-CM | POA: Diagnosis present

## 2024-01-01 DIAGNOSIS — Z951 Presence of aortocoronary bypass graft: Secondary | ICD-10-CM

## 2024-01-01 DIAGNOSIS — E11649 Type 2 diabetes mellitus with hypoglycemia without coma: Secondary | ICD-10-CM | POA: Diagnosis not present

## 2024-01-01 DIAGNOSIS — R64 Cachexia: Secondary | ICD-10-CM | POA: Diagnosis present

## 2024-01-01 DIAGNOSIS — I480 Paroxysmal atrial fibrillation: Secondary | ICD-10-CM | POA: Diagnosis not present

## 2024-01-01 DIAGNOSIS — Z955 Presence of coronary angioplasty implant and graft: Secondary | ICD-10-CM

## 2024-01-01 DIAGNOSIS — E7800 Pure hypercholesterolemia, unspecified: Secondary | ICD-10-CM | POA: Diagnosis present

## 2024-01-01 DIAGNOSIS — M109 Gout, unspecified: Secondary | ICD-10-CM | POA: Diagnosis present

## 2024-01-01 DIAGNOSIS — E1122 Type 2 diabetes mellitus with diabetic chronic kidney disease: Secondary | ICD-10-CM | POA: Diagnosis present

## 2024-01-01 DIAGNOSIS — Z888 Allergy status to other drugs, medicaments and biological substances status: Secondary | ICD-10-CM

## 2024-01-01 DIAGNOSIS — Z8249 Family history of ischemic heart disease and other diseases of the circulatory system: Secondary | ICD-10-CM

## 2024-01-01 DIAGNOSIS — Z79899 Other long term (current) drug therapy: Secondary | ICD-10-CM

## 2024-01-01 DIAGNOSIS — Z91041 Radiographic dye allergy status: Secondary | ICD-10-CM

## 2024-01-01 DIAGNOSIS — E039 Hypothyroidism, unspecified: Secondary | ICD-10-CM | POA: Diagnosis present

## 2024-01-01 DIAGNOSIS — K5521 Angiodysplasia of colon with hemorrhage: Secondary | ICD-10-CM | POA: Diagnosis present

## 2024-01-01 DIAGNOSIS — I2510 Atherosclerotic heart disease of native coronary artery without angina pectoris: Secondary | ICD-10-CM | POA: Diagnosis present

## 2024-01-01 DIAGNOSIS — R29810 Facial weakness: Secondary | ICD-10-CM | POA: Diagnosis present

## 2024-01-01 DIAGNOSIS — Z515 Encounter for palliative care: Secondary | ICD-10-CM

## 2024-01-01 DIAGNOSIS — K2971 Gastritis, unspecified, with bleeding: Secondary | ICD-10-CM | POA: Diagnosis present

## 2024-01-01 DIAGNOSIS — Z85038 Personal history of other malignant neoplasm of large intestine: Secondary | ICD-10-CM

## 2024-01-01 DIAGNOSIS — N186 End stage renal disease: Secondary | ICD-10-CM | POA: Diagnosis present

## 2024-01-01 DIAGNOSIS — K31819 Angiodysplasia of stomach and duodenum without bleeding: Secondary | ICD-10-CM

## 2024-01-01 DIAGNOSIS — Z881 Allergy status to other antibiotic agents status: Secondary | ICD-10-CM

## 2024-01-01 DIAGNOSIS — D631 Anemia in chronic kidney disease: Secondary | ICD-10-CM | POA: Diagnosis present

## 2024-01-01 DIAGNOSIS — Z8543 Personal history of malignant neoplasm of ovary: Secondary | ICD-10-CM

## 2024-01-01 DIAGNOSIS — D62 Acute posthemorrhagic anemia: Secondary | ICD-10-CM | POA: Diagnosis present

## 2024-01-01 DIAGNOSIS — R627 Adult failure to thrive: Secondary | ICD-10-CM | POA: Diagnosis present

## 2024-01-01 DIAGNOSIS — Z8673 Personal history of transient ischemic attack (TIA), and cerebral infarction without residual deficits: Secondary | ICD-10-CM

## 2024-01-01 DIAGNOSIS — E872 Acidosis, unspecified: Secondary | ICD-10-CM | POA: Diagnosis not present

## 2024-01-01 DIAGNOSIS — F419 Anxiety disorder, unspecified: Secondary | ICD-10-CM | POA: Diagnosis present

## 2024-01-01 DIAGNOSIS — I132 Hypertensive heart and chronic kidney disease with heart failure and with stage 5 chronic kidney disease, or end stage renal disease: Secondary | ICD-10-CM | POA: Diagnosis present

## 2024-01-01 DIAGNOSIS — Z79890 Hormone replacement therapy (postmenopausal): Secondary | ICD-10-CM

## 2024-01-01 DIAGNOSIS — F0390 Unspecified dementia without behavioral disturbance: Secondary | ICD-10-CM | POA: Diagnosis present

## 2024-01-01 DIAGNOSIS — Z992 Dependence on renal dialysis: Secondary | ICD-10-CM

## 2024-01-01 DIAGNOSIS — K219 Gastro-esophageal reflux disease without esophagitis: Secondary | ICD-10-CM | POA: Diagnosis present

## 2024-01-01 DIAGNOSIS — K5751 Diverticulosis of both small and large intestine without perforation or abscess with bleeding: Secondary | ICD-10-CM | POA: Diagnosis present

## 2024-01-01 LAB — CBC WITH DIFFERENTIAL/PLATELET
Abs Immature Granulocytes: 0.03 10*3/uL (ref 0.00–0.07)
Basophils Absolute: 0 10*3/uL (ref 0.0–0.1)
Basophils Relative: 0 %
Eosinophils Absolute: 0.3 10*3/uL (ref 0.0–0.5)
Eosinophils Relative: 3 %
HCT: 20.2 % — ABNORMAL LOW (ref 36.0–46.0)
Hemoglobin: 6.5 g/dL — CL (ref 12.0–15.0)
Immature Granulocytes: 0 %
Lymphocytes Relative: 8 %
Lymphs Abs: 0.7 10*3/uL (ref 0.7–4.0)
MCH: 32.5 pg (ref 26.0–34.0)
MCHC: 32.2 g/dL (ref 30.0–36.0)
MCV: 101 fL — ABNORMAL HIGH (ref 80.0–100.0)
Monocytes Absolute: 0.7 10*3/uL (ref 0.1–1.0)
Monocytes Relative: 8 %
Neutro Abs: 6.9 10*3/uL (ref 1.7–7.7)
Neutrophils Relative %: 81 %
Platelets: 194 10*3/uL (ref 150–400)
RBC: 2 MIL/uL — ABNORMAL LOW (ref 3.87–5.11)
RDW: 28 % — ABNORMAL HIGH (ref 11.5–15.5)
Smear Review: ADEQUATE
WBC: 8.6 10*3/uL (ref 4.0–10.5)
nRBC: 0 % (ref 0.0–0.2)

## 2024-01-01 LAB — CBG MONITORING, ED
Glucose-Capillary: 95 mg/dL (ref 70–99)
Glucose-Capillary: 111 mg/dL — ABNORMAL HIGH (ref 70–99)
Glucose-Capillary: 157 mg/dL — ABNORMAL HIGH (ref 70–99)

## 2024-01-01 LAB — COMPREHENSIVE METABOLIC PANEL WITH GFR
ALT: 35 U/L (ref 0–44)
AST: 46 U/L — ABNORMAL HIGH (ref 15–41)
Albumin: 2.9 g/dL — ABNORMAL LOW (ref 3.5–5.0)
Alkaline Phosphatase: 228 U/L — ABNORMAL HIGH (ref 38–126)
Anion gap: 14 (ref 5–15)
BUN: 20 mg/dL (ref 8–23)
CO2: 25 mmol/L (ref 22–32)
Calcium: 8.3 mg/dL — ABNORMAL LOW (ref 8.9–10.3)
Chloride: 98 mmol/L (ref 98–111)
Creatinine, Ser: 2.49 mg/dL — ABNORMAL HIGH (ref 0.44–1.00)
GFR, Estimated: 19 mL/min — ABNORMAL LOW (ref >60)
Glucose, Bld: 148 mg/dL — ABNORMAL HIGH (ref 70–99)
Potassium: 2.5 mmol/L — CL (ref 3.5–5.1)
Sodium: 137 mmol/L (ref 135–145)
Total Bilirubin: 1.3 mg/dL — ABNORMAL HIGH (ref 0.0–1.2)
Total Protein: 7 g/dL (ref 6.5–8.1)

## 2024-01-01 LAB — PREPARE RBC (CROSSMATCH)

## 2024-01-01 MED ORDER — ACETAMINOPHEN 650 MG RE SUPP: 650.0000 mg | Freq: Four times a day (QID) | RECTAL | Status: DC | PRN

## 2024-01-01 MED ORDER — LEVETIRACETAM 500 MG PO TABS
500.0000 mg | ORAL_TABLET | Freq: Every day | ORAL | Status: DC
  Administered 2024-01-02 – 2024-01-04 (×3): 500 mg via ORAL
  Filled 2024-01-01 (×6): qty 1

## 2024-01-01 MED ORDER — ISOSORBIDE MONONITRATE ER 60 MG PO TB24
30.0000 mg | ORAL_TABLET | Freq: Every day | ORAL | Status: DC
  Administered 2024-01-02 – 2024-01-04 (×2): 30 mg via ORAL
  Filled 2024-01-01 (×5): qty 1

## 2024-01-01 MED ORDER — ROSUVASTATIN CALCIUM 10 MG PO TABS
10.0000 mg | ORAL_TABLET | Freq: Every day | ORAL | Status: DC
  Administered 2024-01-02 – 2024-01-07 (×3): 10 mg via ORAL
  Filled 2024-01-01 (×7): qty 1

## 2024-01-01 MED ORDER — POTASSIUM CHLORIDE 10 MEQ/100ML IV SOLN
10.0000 meq | INTRAVENOUS | Status: AC
  Administered 2024-01-01 – 2024-01-02 (×5): 10 meq via INTRAVENOUS
  Filled 2024-01-01 (×5): qty 100

## 2024-01-01 MED ORDER — INSULIN ASPART 100 UNIT/ML IJ SOLN
0.0000 [IU] | Freq: Three times a day (TID) | INTRAMUSCULAR | Status: DC
  Administered 2024-01-07: 1 [IU] via SUBCUTANEOUS

## 2024-01-01 MED ORDER — ALBUTEROL SULFATE (2.5 MG/3ML) 0.083% IN NEBU: 2.5000 mg | INHALATION_SOLUTION | Freq: Four times a day (QID) | RESPIRATORY_TRACT | Status: DC | PRN

## 2024-01-01 MED ORDER — METOPROLOL SUCCINATE ER 25 MG PO TB24
25.0000 mg | ORAL_TABLET | Freq: Two times a day (BID) | ORAL | Status: DC
  Administered 2024-01-01 – 2024-01-04 (×6): 25 mg via ORAL
  Filled 2024-01-01 (×10): qty 1

## 2024-01-01 MED ORDER — HYDROXYZINE HCL 25 MG PO TABS
25.0000 mg | ORAL_TABLET | Freq: Four times a day (QID) | ORAL | Status: DC | PRN
  Administered 2024-01-02: 25 mg via ORAL
  Filled 2024-01-01 (×3): qty 1

## 2024-01-01 MED ORDER — CHLORHEXIDINE GLUCONATE CLOTH 2 % EX PADS
6.0000 | MEDICATED_PAD | Freq: Every day | CUTANEOUS | Status: DC
  Administered 2024-01-02 – 2024-01-08 (×4): 6 via TOPICAL

## 2024-01-01 MED ORDER — LEVOTHYROXINE SODIUM 50 MCG PO TABS
50.0000 ug | ORAL_TABLET | Freq: Every day | ORAL | Status: DC
  Administered 2024-01-02: 50 ug via ORAL
  Filled 2024-01-01: qty 2
  Filled 2024-01-01: qty 1

## 2024-01-01 MED ORDER — SODIUM CHLORIDE 0.9% IV SOLUTION: Freq: Once | INTRAVENOUS | Status: DC

## 2024-01-01 MED ORDER — INSULIN ASPART 100 UNIT/ML IJ SOLN: 0.0000 [IU] | Freq: Every day | INTRAMUSCULAR | Status: DC

## 2024-01-01 MED ORDER — ACETAMINOPHEN 325 MG PO TABS
650.0000 mg | ORAL_TABLET | Freq: Four times a day (QID) | ORAL | Status: DC | PRN
  Administered 2024-01-02: 650 mg via ORAL
  Filled 2024-01-01 (×2): qty 2

## 2024-01-01 MED ORDER — SODIUM CHLORIDE 0.9% FLUSH
3.0000 mL | Freq: Two times a day (BID) | INTRAVENOUS | Status: DC
Start: 2024-01-01 — End: 2024-01-08
  Administered 2024-01-02 – 2024-01-07 (×10): 3 mL via INTRAVENOUS

## 2024-01-01 NOTE — ED Provider Notes (Signed)
 [place] EMERGENCY DEPARTMENT AT [facility] Provider Note   CSN: [id number] Arrival date & time: 01/01/24  1810     Patient presents with: Hypoglycemia   Shawna [name] is a 83 y.o. female.   HPI Patient presents with her husband who assists with the history. Patient has a history of AVM, regularly receives blood transfusions.  She also history of confusion.  Today, the end of the full dialysis session she was noticed to be somewhat somnolent.  Glucose check was 60s.  Repeat check after oral sugar was in the 60s as well, patient transferred here for evaluation.  Patient self does not specify any complaints, though she requires awakening for conversation.  Husband states that she has otherwise been in her usual state of health.    Prior to Admission medications   Medication Sig Start Date End Date Taking? Authorizing Provider  albuterol  (PROVENTIL ) (2.5 MG/3ML) 0.083% nebulizer solution USE 1 VIAL IN NEBULIZER 4 TIMES DAILY AS NEEDED FOR COUGH, WHEEZING, AND SHORTNESS OF BREATH 10/20/23   [provider]  albuterol  (VENTOLIN  HFA) 108 (90 Base) MCG/ACT inhaler SMARTSIG:2 Puff(s) By Mouth Every 4-6 Hours PRN 10/10/23   [provider]  benzonatate  (TESSALON ) 100 MG capsule Take 1 capsule (100 mg total) by mouth 2 (two) times daily as needed for cough. 11/05/23   Vicci Afton CROME, MD  calcitRIOL  (ROCALTROL ) 0.25 MCG capsule Take 7 capsules (1.75 mcg total) by mouth every Monday, Wednesday, and Friday with hemodialysis. 12/21/22   Christobal Guadalajara, MD  cinacalcet  (SENSIPAR ) 30 MG tablet Take 1 tablet (30 mg total) by mouth every Monday, Wednesday, and Friday with hemodialysis. 12/19/22   Christobal Guadalajara, MD  cyanocobalamin  (VITAMIN B12) 1000 MCG/ML injection Inject 1,000 mcg into the muscle every 30 (thirty) days.    [provider]  famotidine  (PEPCID ) 20 MG tablet Take 1 tablet (20 mg total) by mouth daily as needed for heartburn or indigestion. 09/10/23 09/09/24  Johnson,  Clanford L, MD  folic acid  (FOLVITE ) 1 MG tablet Take 1 mg by mouth daily. 05/24/22   [provider]  guaifenesin  (ROBITUSSIN) 100 MG/5ML syrup Take 200 mg by mouth 3 (three) times daily as needed for cough.    [provider]  hydrOXYzine  (ATARAX ) 25 MG tablet Take 25 mg by mouth every 6 (six) hours as needed for itching. 06/06/22   [provider]  isosorbide  mononitrate (IMDUR ) 30 MG 24 hr tablet Take 1 tablet (30 mg total) by mouth daily. 11/15/23   Cindy Garnette MARLA, MD  levETIRAcetam  (KEPPRA ) 500 MG tablet Take 1 tablet (500 mg total) by mouth daily. 11/15/22   Dennise Lavada MARLA, MD  levothyroxine  (SYNTHROID ) 50 MCG tablet Take 50 mcg by mouth daily. 08/27/23   [provider]  metoprolol  succinate (TOPROL  XL) 25 MG 24 hr tablet Take 1 tablet (25 mg total) by mouth 2 (two) times daily. Take an extra 25 mg for heart rate over 100 11/30/23   Furth, Cadence H, PA-C  multivitamin (RENA-VIT) TABS tablet Take 1 tablet by mouth daily.    [provider]  nitroGLYCERIN  (NITROSTAT ) 0.4 MG SL tablet Place 0.4 mg under the tongue every 5 (five) minutes as needed for chest pain.    [provider]  octreotide  (SANDOSTATIN  LAR) 20 MG injection Inject 20 mg into the muscle every 28 (twenty-eight) days. 11/15/22   Singh, Prashant K, MD  ondansetron  (ZOFRAN -ODT) 4 MG disintegrating tablet Take 4 mg by mouth every 8 (eight) hours as  needed for nausea or vomiting. 05/22/23   [provider]  rosuvastatin  (CRESTOR ) 10 MG tablet Take 1 tablet by mouth once daily 06/06/22   Strader, Brittany M, PA-C    Allergies: Amlodipine , Aspirin , Nitrofurantoin, Ranexa  [ranolazine ], Bactrim [sulfamethoxazole-trimethoprim], Gabapentin , Iodinated contrast media, Iron , Sucroferric oxyhydroxide, Tylenol  [acetaminophen ], Levaquin  [levofloxacin  in d5w], Levofloxacin , Plavix  [clopidogrel  bisulfate], Protonix  [pantoprazole  sodium], and Venofer [ferric oxide]    Review of  Systems  Updated Vital Signs BP (!) 186/62 (BP Location: Left Arm)   Pulse (!) 59   Temp 99.2 F (37.3 C) (Oral)   Resp 18   Ht 5' 1 (1.549 m)   Wt 50 kg   SpO2 95%   BMI 20.83 kg/m   Physical Exam Vitals and nursing note reviewed.  Constitutional:      General: She is not in acute distress.    Appearance: She is well-developed.  HENT:     Head: Normocephalic and atraumatic.   Eyes:     Conjunctiva/sclera: Conjunctivae normal.    Cardiovascular:     Rate and Rhythm: Normal rate and regular rhythm.  Pulmonary:     Effort: Pulmonary effort is normal. No respiratory distress.     Breath sounds: Normal breath sounds. No stridor.  Abdominal:     General: There is no distension.   Skin:    General: Skin is warm and dry.   Neurological:     Cranial Nerves: No cranial nerve deficit.     Motor: Atrophy present.   Psychiatric:        Mood and Affect: Mood normal.     (all labs ordered are listed, but only abnormal results are displayed) Labs Reviewed  COMPREHENSIVE METABOLIC PANEL WITH GFR - Abnormal; Notable for the following components:      Result Value   Potassium 2.5 (*)    Glucose, Bld 148 (*)    Creatinine, Ser 2.49 (*)    Calcium  8.3 (*)    Albumin  2.9 (*)    AST 46 (*)    Alkaline Phosphatase 228 (*)    Total Bilirubin 1.3 (*)    GFR, Estimated 19 (*)    All other components within normal limits  CBC WITH DIFFERENTIAL/PLATELET - Abnormal; Notable for the following components:   RBC 2.00 (*)    Hemoglobin 6.5 (*)    HCT 20.2 (*)    MCV 101.0 (*)    RDW 28.0 (*)    All other components within normal limits  CBG MONITORING, ED - Abnormal; Notable for the following components:   Glucose-Capillary 111 (*)    All other components within normal limits  CBG MONITORING, ED  TYPE AND SCREEN  PREPARE RBC (CROSSMATCH)    EKG: Sinus 60 PAC, left bundle branch block, wander, T wave abnormalities no substantial changes from prior abnormal  Radiology: No  results found.   Procedures   Medications Ordered in the ED  potassium chloride  10 mEq in 100 mL IVPB (has no administration in time range)  0.9 %  sodium chloride  infusion (Manually program via Guardrails IV Fluids) (has no administration in time range)                                    Medical Decision Making Elderly female with multiple medical issues including AVM, end-stage renal disease now presents with concern for hypoglycemia.  Husband's endorsement of the patient being in her usual state of health  prior to dialysis is somewhat reassuring.  Differential includes dialysis related changes, infection, electrolyte abnormalities. Pulse ox 95% room air normal  Amount and/or Complexity of Data Reviewed Independent Historian: spouse External Data Reviewed: notes.    Details: Notes from ED visits over the past 2 weeks including transfusion reviewed Labs: ordered. Decision-making details documented in ED Course.  Risk Prescription drug management. Decision regarding hospitalization. Diagnosis or treatment significantly limited by social determinants of health.  Initial labs notable for hypokalemia, ECG pending 9:01 PM Patient's room labs notable for hemoglobin 6.5.  This consistent with multiple prior similar episodes of anemia. I discussed this with the patient's husband at length including his discussions with GI and primary care.  He indicates that as far as they are aware patient will continue to receive transfusions occasionally as needed.  GI reports no additional interventions indicated.  Pertinent GI notes and recent hospitalization notes included below.  With concern for hypokalemia and symptomatic anemia patient will receive IV potassium, blood transfusion, will require admission for monitoring given history of heart failure, end-stage renal disease and advanced age.  ECG without initially concerning changes  Symptomatic anemia w  Acute on chronic blood loss anemia Anemia  of chronic kidney disease - History of AVM Hemoccult positive stool - Patient with known history of anemia, history of GI bleed due to small bowel AVM, previous recommendation has been made for supportive transfusion and long acting somatostatin which she has been receiving. - Hemoglobin 7.0 this visit, transfused 1 unit PRBC with HD, Hg trending - Dr. Willette discussed with GI, no plans for further procedures this admission - she is to take her monthly long acting octreotide  20 mg injection when she gets home, our pharmacy does not have the medication in hospital.   Hospital Course: 83 y.o. female,  with a history of AVMs with recurrent GI bleeding, anemia, HFrEF, CAD s/p CABG on aspirin  81mg , PAF, ESRD on HD MWF, T2DM, and colon CA s/p resection 1992 patient well-known to our service with multiple presentation, hospitalization, has known history of acute on chronic blood loss anemia given history of AVMs requiring multiple admissions for transfusions.   - Presents to ED as instructed by her dialysis center for low hemoglobin as she was told at 6.9, she does report increased fatigue, dizziness, weakness, and dark stools, denies nausea, vomiting or abdominal pain, she is not on any aspirin  or anticoagulation, during recent hospitalization she was discharged on metoprolol  and amiodarone .  - In ED labs were significant for stable hemoglobin at 8.3, but she was Hemoccult positive, even though her stool color with no melena, as well she was noted bradycardic heart rate at 41, so triage hospitalist consulted to admit.  Final diagnoses:  Symptomatic anemia  Hypokalemia   CRITICAL CARE Performed by: Lamar Salen Total critical care time: 35 minutes Critical care time was exclusive of separately billable procedures and treating other patients. Critical care was necessary to treat or prevent imminent or life-threatening deterioration. Critical care was time spent personally by me on the following  activities: development of treatment plan with patient and/or surrogate as well as nursing, discussions with consultants, evaluation of patient's response to treatment, examination of patient, obtaining history from patient or surrogate, ordering and performing treatments and interventions, ordering and review of laboratory studies, ordering and review of radiographic studies, pulse oximetry and re-evaluation of patient's condition.    Salen Lamar, MD 01/01/24 2106

## 2024-01-01 NOTE — H&P (Signed)
 History and Physical    Shawna [name] DOB: 14-Apr-1941 DOA: 01/01/2024  PCP: [facility]   Patient coming from: Home   Chief Complaint: Low blood glucose   HPI: Shawna [name] is an 83 y.o. female with medical history significant for AVMs with recurrent GI bleeding, chronic HFpEF, CAD status post CABG, PAF not anticoagulated, colon cancer status postresection, type 2 diabetes mellitus, and ESRD on hemodialysis who presents with low blood glucose.  Patient was reportedly in her usual state of health today at home, ate breakfast, and went to dialysis where she reportedly completed her session.  She was noted to be somnolent following the treatment and CBG was in the low 60s.  She was given food and drink but repeat CBG was still in the low 60s and she was sent to the ED for evaluation.    Patient has no complaints herself.  Patient has been evaluated by GI numerous times in the hospital and receives long-acting octreotide  injections and blood transfusions.  ED Course: Upon arrival to the ED, patient is found to be afebrile and saturating well on room air with stable BP.  Labs are most notable for potassium 2.5, albumin  2.9, and hemoglobin 6.5.  Patient was treated with IV potassium and had 2 units RBC ordered for transfusion from the ED.  Review of Systems:  All other systems reviewed and apart from HPI, are negative.  Past Medical History:  Diagnosis Date   Acute on chronic respiratory failure with hypoxia (HCC) 10/10/2016   Anxiety    Arthritis    AVM (arteriovenous malformation) of colon    CAD (coronary artery disease)    a. s/p CABG in 2013 b. DES to D1 in 10/2016. c. cath in 07/2018 showing patent grafts with occlusion of D1 at prior stent site and progression of PDA disease --> medical management recommended   Carotid artery disease (HCC)    a. 60-79% LICA, 03/2012    Chronic bronchitis (HCC)    Chronic HFrEF (heart failure with reduced ejection  fraction) (HCC)    Colon cancer (HCC) 1992   Esophageal stricture    ESRD on hemodialysis (HCC)    ESRD due to HTN, started dialysis 2011 and gets HD at [facility] with Dr Edwardo on MWF schedule.  Access is LUA AVF as of Sept 2014.    GERD (gastroesophageal reflux disease)    High cholesterol 12/2011   History of blood transfusion 07/2011; 12/2011; 01/2012 X 2; 04/2012   History of gout    History of lower GI bleeding    Hypertension    Iron  deficiency anemia    Jugular vein occlusion, right (HCC)    Mitral regurgitation    a. Moderate by echo, 02/2012   Mitral valve disease    NSVT (nonsustained ventricular tachycardia) (HCC)    Ovarian cancer (HCC) 1992   PAF (paroxysmal atrial fibrillation) (HCC)    Pneumonia ~ 2009   PUD (peptic ulcer disease)    TIA (transient ischemic attack)    Tricuspid valve disease     Past Surgical History:  Procedure Laterality Date   A/V FISTULAGRAM Left 10/04/2022   Procedure: A/V Fistulagram;  Surgeon: Jama Cordella MATSU, MD;  Location: [facility] INVASIVE CV LAB;  Service: Cardiovascular;  Laterality: Left;   A/V SHUNTOGRAM Left 03/19/2019   Procedure: A/V SHUNTOGRAM;  Surgeon: Jama Cordella MATSU, MD;  Location: [facility] INVASIVE CV LAB;  Service: Cardiovascular;  Laterality: Left;   ABDOMINAL HYSTERECTOMY  1992  APPENDECTOMY  06/1990   AV FISTULA PLACEMENT  07/2009   left upper arm   AV FISTULA PLACEMENT Right 09/06/2016   Procedure: RIGHT FOREARM ARTERIOVENOUS (AV) GRAFT;  Surgeon: Carlin FORBES Haddock, MD;  Location: [facility] OR;  Service: Vascular;  Laterality: Right;   AV FISTULA PLACEMENT N/A 02/24/2017   Procedure: INSERTION OF ARTERIOVENOUS (AV) GORE-TEX GRAFT ARM (BRACHIAL AXILLARY);  Surgeon: Jama Cordella MATSU, MD;  Location: [facility] ORS;  Service: Vascular;  Laterality: N/A;   AVGG REMOVAL Right 09/06/2016   Procedure: REMOVAL OF Right Arm ARTERIOVENOUS GORETEX GRAFT and Vein Patch angioplasty of brachial artery;  Surgeon: Lonni GORMAN Blade, MD;  Location:  [facility] OR;  Service: Vascular;  Laterality: Right;   BIOPSY  09/26/2019   Procedure: BIOPSY;  Surgeon: Golda Claudis PENNER, MD;  [facility];  Service: Endoscopy;;   BIOPSY  11/01/2022   Procedure: BIOPSY;  Surgeon: Eartha Angelia Sieving, MD;  [facility];  Service: Gastroenterology;;   COLON RESECTION  1992   COLON SURGERY     COLONOSCOPY N/A 03/09/2019   Procedure: COLONOSCOPY;  Surgeon: Golda Claudis PENNER, MD;  [facility];  Service: Endoscopy;  Laterality: N/A;   COLONOSCOPY N/A 08/11/2022   Procedure: COLONOSCOPY;  Surgeon: Albertus Gordy HERO, MD;  Location: [facility] ENDOSCOPY;  Service: Gastroenterology;  Laterality: N/A;   COLONOSCOPY WITH PROPOFOL  N/A 06/08/2021   Procedure: COLONOSCOPY WITH PROPOFOL ;  Surgeon: Eartha Angelia Sieving, MD;  [facility];  Service: Gastroenterology;  Laterality: N/A;  9:05 /Patient is on dialysis Mon Wed Fri   CORONARY ANGIOPLASTY WITH STENT PLACEMENT  12/15/11   2   CORONARY ANGIOPLASTY WITH STENT PLACEMENT  y/2013   1; makes total of 3 (05/02/2012)   CORONARY ARTERY BYPASS GRAFT  06/13/2012   Procedure: CORONARY ARTERY BYPASS GRAFTING (CABG);  Surgeon: Dallas KATHEE Jude, MD;  Location: [facility] OR;  Service: Open Heart Surgery;  Laterality: N/A;  cabg x four;  using left internal mammary artery, and left leg greater saphenous vein harvested endoscopically   CORONARY STENT INTERVENTION N/A 10/13/2016   Procedure: Coronary Stent Intervention;  Surgeon: Debby DELENA Sor, MD;  [facility] LAB;  Service: Cardiovascular;  Laterality: N/A;   DIALYSIS/PERMA CATHETER REMOVAL N/A 04/18/2017   Procedure: DIALYSIS/PERMA CATHETER REMOVAL;  Surgeon: Jama Cordella MATSU, MD;  Location: [facility] INVASIVE CV LAB;  Service: Cardiovascular;  Laterality: N/A;   DILATION AND CURETTAGE OF UTERUS     ENTEROSCOPY N/A 06/08/2021   Procedure: PUSH ENTEROSCOPY;  Surgeon: Eartha Angelia Sieving, MD;  [facility];  Service:  Gastroenterology;  Laterality: N/A;   ENTEROSCOPY N/A 11/01/2022   Procedure: ENTEROSCOPY;  Surgeon: Eartha Angelia Sieving, MD;  [facility];  Service: Gastroenterology;  Laterality: N/A;   ENTEROSCOPY N/A 12/01/2022   Procedure: ENTEROSCOPY;  Surgeon: Leigh Elspeth SQUIBB, MD;  Location: [facility] ENDOSCOPY;  Service: Gastroenterology;  Laterality: N/A;   ESOPHAGOGASTRODUODENOSCOPY  01/20/2012   Procedure: ESOPHAGOGASTRODUODENOSCOPY (EGD);  Surgeon: Gwendlyn ONEIDA Buddy, MD,FACG;  Location: [facility] ENDOSCOPY;  Service: Endoscopy;  Laterality: N/A;   ESOPHAGOGASTRODUODENOSCOPY N/A 03/26/2013   Procedure: ESOPHAGOGASTRODUODENOSCOPY (EGD);  Surgeon: Norleen LOISE Kiang, MD;  Location: [facility] ENDOSCOPY;  Service: Endoscopy;  Laterality: N/A;   ESOPHAGOGASTRODUODENOSCOPY N/A 04/30/2015   Procedure: ESOPHAGOGASTRODUODENOSCOPY (EGD);  Surgeon: Claudis PENNER Golda, MD;  [facility];  Service: Endoscopy;  Laterality: N/A;  1pm - moved to 10/20 @ 1:10   ESOPHAGOGASTRODUODENOSCOPY N/A 07/29/2016   Procedure: ESOPHAGOGASTRODUODENOSCOPY (EGD);  Surgeon: Elspeth  Deward Naval, MD;  Location: [facility] ENDOSCOPY;  Service: Gastroenterology;  Laterality: N/A;  enteroscopy   ESOPHAGOGASTRODUODENOSCOPY N/A 09/26/2019   Procedure: ESOPHAGOGASTRODUODENOSCOPY (EGD);  Surgeon: Golda Claudis PENNER, MD;  [facility];  Service: Endoscopy;  Laterality: N/A;  1250   ESOPHAGOGASTRODUODENOSCOPY N/A 08/11/2022   Procedure: ESOPHAGOGASTRODUODENOSCOPY (EGD);  Surgeon: Albertus Gordy HERO, MD;  Location: [facility] ENDOSCOPY;  Service: Gastroenterology;  Laterality: N/A;   ESOPHAGOGASTRODUODENOSCOPY (EGD) WITH PROPOFOL  N/A 02/05/2021   Procedure: ESOPHAGOGASTRODUODENOSCOPY (EGD) WITH PROPOFOL ;  Surgeon: Cindie Carlin POUR, DO;  [facility];  Service: Endoscopy;  Laterality: N/A;   ESOPHAGOGASTRODUODENOSCOPY (EGD) WITH PROPOFOL  N/A 08/27/2022   Procedure: ESOPHAGOGASTRODUODENOSCOPY (EGD) WITH PROPOFOL ;  Surgeon: Cindie Carlin POUR, DO;   [facility];  Service: Endoscopy;  Laterality: N/A;   GIVENS CAPSULE STUDY N/A 03/07/2019   Procedure: GIVENS CAPSULE STUDY;  Surgeon: Golda Claudis PENNER, MD;  [facility];  Service: Endoscopy;  Laterality: N/A;  7:30   GIVENS CAPSULE STUDY N/A 04/22/2021   Procedure: GIVENS CAPSULE STUDY;  Surgeon: Golda Claudis PENNER, MD;  [facility];  Service: Endoscopy;  Laterality: N/A;  7:30   GIVENS CAPSULE STUDY N/A 08/27/2022   Procedure: GIVENS CAPSULE STUDY;  Surgeon: Cindie Carlin POUR, DO;  [facility];  Service: Endoscopy;  Laterality: N/A;   HOT HEMOSTASIS N/A 08/11/2022   Procedure: HOT HEMOSTASIS (ARGON PLASMA COAGULATION/BICAP);  Surgeon: Albertus Gordy HERO, MD;  Location: [facility] ENDOSCOPY;  Service: Gastroenterology;  Laterality: N/A;   HOT HEMOSTASIS N/A 12/01/2022   Procedure: HOT HEMOSTASIS (ARGON PLASMA COAGULATION/BICAP);  Surgeon: Naval Elspeth SQUIBB, MD;  Location: [facility] ENDOSCOPY;  Service: Gastroenterology;  Laterality: N/A;   INSERTION OF DIALYSIS CATHETER N/A 10/05/2020   Procedure: ABORTED TUNNELED DIALYSIS CATHETER PLACEMENT RIGHT INTERNAL JUGULAR VEIN ;  Surgeon: Kallie Manuelita BROCKS, MD;  Location: [facility];  Service: General;  Laterality: N/A;   INTRAOPERATIVE TRANSESOPHAGEAL ECHOCARDIOGRAM  06/13/2012   Procedure: INTRAOPERATIVE TRANSESOPHAGEAL ECHOCARDIOGRAM;  Surgeon: Dallas KATHEE Jude, MD;  Location: [facility] OR;  Service: Open Heart Surgery;  Laterality: N/A;   IR DIALY SHUNT INTRO NEEDLE/INTRACATH INITIAL W/IMG LEFT Left 10/06/2020   IR FLUORO GUIDE CV LINE RIGHT  06/17/2020   IR FLUORO GUIDE CV LINE RIGHT  11/12/2022   IR GENERIC HISTORICAL  07/26/2016   IR FLUORO GUIDE CV LINE RIGHT 07/26/2016 Ozell Specking, MD [facility]   IR GENERIC HISTORICAL  07/26/2016   IR US  GUIDE VASC ACCESS RIGHT 07/26/2016 Ozell Specking, MD [facility]   IR GENERIC HISTORICAL  08/02/2016   IR US  GUIDE VASC ACCESS RIGHT 08/02/2016 Ozell Specking, MD [facility]   IR GENERIC  HISTORICAL  08/02/2016   IR FLUORO GUIDE CV LINE RIGHT 08/02/2016 Ozell Specking, MD [facility]   IR RADIOLOGY PERIPHERAL GUIDED IV START  03/28/2017   IR REMOVAL TUN CV CATH W/O FL  08/11/2020   IR REMOVAL TUN CV CATH W/O FL  11/15/2022   IR THROMBECTOMY AV FISTULA W/THROMBOLYSIS INC/SHUNT/IMG LEFT Left 06/17/2020   IR US  GUIDE VASC ACCESS LEFT  06/17/2020   IR US  GUIDE VASC ACCESS RIGHT  03/28/2017   IR US  GUIDE VASC ACCESS RIGHT  06/17/2020   IR US  GUIDE VASC ACCESS RIGHT  11/12/2022   LEFT HEART CATH AND CORONARY ANGIOGRAPHY N/A 09/20/2016   Procedure: Left Heart Cath and Coronary Angiography;  Surgeon: Victory LELON Sharps, MD;  Location: [facility] INVASIVE CV LAB;  Service: Cardiovascular;  Laterality: N/A;   LEFT HEART CATH AND  CORS/GRAFTS ANGIOGRAPHY N/A 10/13/2016   Procedure: Left Heart Cath and Cors/Grafts Angiography;  Surgeon: Debby DELENA Sor, MD;  Location: [facility] INVASIVE CV LAB;  Service: Cardiovascular;  Laterality: N/A;   LEFT HEART CATH AND CORS/GRAFTS ANGIOGRAPHY N/A 07/13/2018   Procedure: LEFT HEART CATH AND CORS/GRAFTS ANGIOGRAPHY;  Surgeon: Swazil[place], Peter M, MD;  Location: [facility] INVASIVE CV LAB;  Service: Cardiovascular;  Laterality: N/A;   LEFT HEART CATH AND CORS/GRAFTS ANGIOGRAPHY N/A 07/22/2021   Procedure: LEFT HEART CATH AND CORS/GRAFTS ANGIOGRAPHY;  Surgeon: Court Dorn PARAS, MD;  Location: [facility] INV[facility] CV LAB;  Service: Cardiovascular;  Laterality: N/A;   LEFT HEART CATHETERIZATION WITH CORONARY ANGIOGRAM N/A 12/15/2011   Procedure: LEFT HEART CATHETERIZATION WITH CORONARY ANGIOGRAM;  Surgeon: Lonni JONETTA Cash, MD;  Location: [name] CATH LAB;  Service: Cardiovascular;  Laterality: N/A;   LEFT HEART CATHETERIZATION WITH CORONARY ANGIOGRAM N/A 01/10/2012   Procedure: LEFT HEART CATHETERIZATION WITH CORONARY ANGIOGRAM;  Surgeon: Peter M Swaziland, MD;  Location: [facility] CATH LAB;  Service: Cardiovascular;  Laterality: N/A;   LEFT HEART CATHETERIZATION WITH CORONARY ANGIOGRAM N/A 06/08/2012   Procedure: LEFT HEART  CATHETERIZATION WITH CORONARY ANGIOGRAM;  Surgeon: Lonni JONETTA Cash, MD;  Location: [facility] CATH LAB;  Service: Cardiovascular;  Laterality: N/A;   LEFT HEART CATHETERIZATION WITH CORONARY/GRAFT ANGIOGRAM N/A 12/10/2013   Procedure: LEFT HEART CATHETERIZATION WITH EL BILE;  Surgeon: Candyce GORMAN Reek, MD;  Location: [facility] CATH LAB;  Service: Cardiovascular;  Laterality: N/A;   OVARY SURGERY     ovarian cancer   POLYPECTOMY  03/09/2019   Procedure: POLYPECTOMY;  Surgeon: Golda Claudis PENNER, MD;  [facility];  Service: Endoscopy;;  cecal    POLYPECTOMY N/A 09/26/2019   Procedure: DUODENAL POLYPECTOMY;  Surgeon: Golda Claudis PENNER, MD;  [facility];  Service: Endoscopy;  Laterality: N/A;   POLYPECTOMY  08/11/2022   Procedure: POLYPECTOMY;  Surgeon: Albertus Gordy HERO, MD;  Location: [facility] ENDOSCOPY;  Service: Gastroenterology;;   REVISION OF ARTERIOVENOUS GORETEX GRAFT N/A 02/24/2017   Procedure: REVISION OF ARTERIOVENOUS GORETEX GRAFT (RESECTION);  Surgeon: Jama Cordella MATSU, MD;  Location: [facility] ORS;  Service: Vascular;  Laterality: N/A;   REVISON OF ARTERIOVENOUS FISTULA Left 06/19/2020   Procedure: REVISION OF LEFT UPPER ARM AV GRAFT WITH INTERPOSITION JUMP GRAFT USING GORE LIMB;  Surgeon: Gretta Lonni PARAS, MD;  Location: [facility] OR;  Service: Vascular;  Laterality: Left;   SHUNTOGRAM N/A 10/15/2013   Procedure: Fistulogram;  Surgeon: Gaile LELON New, MD;  Location: [facility] CATH LAB;  Service: Cardiovascular;  Laterality: N/A;   SUBMUCOSAL TATTOO INJECTION  11/01/2022   Procedure: SUBMUCOSAL TATTOO INJECTION;  Surgeon: Eartha Angelia Sieving, MD;  [facility];  Service: Gastroenterology;;   THROMBECTOMY / ARTERIOVENOUS GRAFT REVISION  2011   left upper arm   TUBAL LIGATION  1980's   UPPER EXTREMITY ANGIOGRAPHY Bilateral 12/06/2016   Procedure: Upper Extremity Angiography;  Surgeon: Jama Cordella MATSU, MD;  Location: [facility] INVASIVE CV LAB;  Service:  Cardiovascular;  Laterality: Bilateral;   UPPER EXTREMITY INTERVENTION Left 06/06/2017   Procedure: UPPER EXTREMITY INTERVENTION;  Surgeon: Jama Cordella MATSU, MD;  Location: [facility] INVASIVE CV LAB;  Service: Cardiovascular;  Laterality: Left;   UPPER EXTREMITY VENOGRAPHY Left 10/04/2022   Procedure: UPPER EXTREMITY VENOGRAPHY;  Surgeon: Jama Cordella MATSU, MD;  Location: [facility] INVASIVE CV LAB;  Service: Cardiovascular;  Laterality: Left;    Social History:   reports that she has never smoked. She has never used smokeless tobacco. She reports that  she does not drink alcohol  and does not use drugs.  Allergies  Allergen Reactions   Amlodipine  Swelling   Aspirin  Other (See Comments)    High Doses Mess up her stomach; makes my bowels have blood in them. Takes 81 mg EC Aspirin     Nitrofurantoin Hives   Ranexa  [Ranolazine ] Other (See Comments)    Myoclonus-hospitalized    Bactrim [Sulfamethoxazole-Trimethoprim] Rash   Gabapentin  Other (See Comments)    Unknown reaction   Iodinated Contrast Media Itching and Other (See Comments)   Iron  Itching and Other (See Comments)    they gave me iron  in dialysis; had to give me Benadryl  cause I had to have the iron  (05/02/2012)   Sucroferric Oxyhydroxide Other (See Comments)    Unknown   Tylenol  [Acetaminophen ] Itching and Other (See Comments)    Makes her feet on fire per pt - can tolerate per pt   Levaquin  [Levofloxacin  In D5w] Rash   Levofloxacin  Rash   Plavix  [Clopidogrel  Bisulfate] Rash   Protonix  [Pantoprazole  Sodium] Rash   Venofer [Ferric Oxide] Itching and Other (See Comments)    Patient reports using Benadryl  prior to doses as [facility]    Family History  Problem Relation Age of Onset   Heart disease Mother        Heart Disease before age 69   Hyperlipidemia Mother    Hypertension Mother    Diabetes Mother    Heart attack Mother    Heart disease Father        Heart Disease before age 47   Hyperlipidemia Father     Hypertension Father    Diabetes Father    Diabetes Sister    Hypertension Sister    Diabetes Brother    Hyperlipidemia Brother    Heart attack Brother    Hypertension Sister    Heart attack Brother    Colon cancer Child 64   Other Other        noncontributory for early CAD   Esophageal cancer Neg Hx    Liver disease Neg Hx    Kidney disease Neg Hx    Colon polyps Neg Hx      Prior to Admission medications   Medication Sig Start Date End Date Taking? Authorizing Provider  albuterol  (PROVENTIL ) (2.5 MG/3ML) 0.083% nebulizer solution USE 1 VIAL IN NEBULIZER 4 TIMES DAILY AS NEEDED FOR COUGH, WHEEZING, AND SHORTNESS OF BREATH 10/20/23   [provider]  albuterol  (VENTOLIN  HFA) 108 (90 Base) MCG/ACT inhaler SMARTSIG:2 Puff(s) By Mouth Every 4-6 Hours PRN 10/10/23   [provider]  benzonatate  (TESSALON ) 100 MG capsule Take 1 capsule (100 mg total) by mouth 2 (two) times daily as needed for cough. 11/05/23   Vicci Afton CROME, MD  calcitRIOL  (ROCALTROL ) 0.25 MCG capsule Take 7 capsules (1.75 mcg total) by mouth every Monday, Wednesday, and Friday with hemodialysis. 12/21/22   Christobal Guadalajara, MD  cinacalcet  (SENSIPAR ) 30 MG tablet Take 1 tablet (30 mg total) by mouth every Monday, Wednesday, and Friday with hemodialysis. 12/19/22   Christobal Guadalajara, MD  cyanocobalamin  (VITAMIN B12) 1000 MCG/ML injection Inject 1,000 mcg into the muscle every 30 (thirty) days.    [provider]  famotidine  (PEPCID ) 20 MG tablet Take 1 tablet (20 mg total) by mouth daily as needed for heartburn or indigestion. 09/10/23 09/09/24  Johnson, Clanford L, MD  folic acid  (FOLVITE ) 1 MG tablet Take 1 mg by mouth daily. 05/24/22   [provider]  guaifenesin  (ROBITUSSIN) 100 MG/5ML  syrup Take 200 mg by mouth 3 (three) times daily as needed for cough.    [provider]  hydrOXYzine  (ATARAX ) 25 MG tablet Take 25 mg by mouth every 6 (six) hours as needed for itching. 06/06/22   [provider]  isosorbide  mononitrate (IMDUR ) 30 MG 24 hr tablet Take 1 tablet (30 mg total) by mouth daily. 11/15/23   Cindy Garnette POUR, MD  levETIRAcetam  (KEPPRA ) 500 MG tablet Take 1 tablet (500 mg total) by mouth daily. 11/15/22   Singh, Prashant K, MD  levothyroxine  (SYNTHROID ) 50 MCG tablet Take 50 mcg by mouth daily. 08/27/23   [provider]  metoprolol  succinate (TOPROL  XL) 25 MG 24 hr tablet Take 1 tablet (25 mg total) by mouth 2 (two) times daily. Take an extra 25 mg for heart rate over 100 11/30/23   Furth, Cadence H, PA-C  multivitamin (RENA-VIT) TABS tablet Take 1 tablet by mouth daily.    [provider]  nitroGLYCERIN  (NITROSTAT ) 0.4 MG SL tablet Place 0.4 mg under the tongue every 5 (five) minutes as needed for chest pain.    [provider]  octreotide  (SANDOSTATIN  LAR) 20 MG injection Inject 20 mg into the muscle every 28 (twenty-eight) days. 11/15/22   Singh, Prashant K, MD  ondansetron  (ZOFRAN -ODT) 4 MG disintegrating tablet Take 4 mg by mouth every 8 (eight) hours as needed for nausea or vomiting. 05/22/23   [provider]  rosuvastatin  (CRESTOR ) 10 MG tablet Take 1 tablet by mouth once daily 06/06/22   Strader, Brittany M, [place]    Physical Exam: Vitals:   01/01/24 1817 01/01/24 1817 01/01/24 1952  BP:  (!) 186/62   Pulse:  (!) 59   Resp:  18   Temp:   99.2 F (37.3 C)  TempSrc:   Oral  SpO2:  95%   Weight: 50 kg    Height: 5' 1 (1.549 m)       Constitutional: NAD, no pallor or diaphoresis  Eyes: PERTLA, lids and conjunctivae normal ENMT: Mucous membranes are moist. Posterior pharynx clear of any exudate or lesions.   Neck: supple, no masses  Respiratory: no wheezing, no crackles. No accessory muscle use.  Cardiovascular: S1 & S2 heard, regular rate and rhythm. No extremity edema.  Abdomen: No tenderness, soft. Bowel sounds active.  Musculoskeletal: no clubbing / cyanosis. No joint deformity upper and lower extremities.   Skin:  no significant rashes, lesions, ulcers. Warm, dry, well-perfused. Neurologic: CN 2-12 grossly intact. Moving all extremities. Sleeping. Wakes to voice but does not speak much.   Psychiatric: Calm. Cooperative.    Labs and Imaging on Admission: I have personally reviewed following labs and imaging studies  CBC: Recent Labs  Lab 01/01/24 1914  WBC 8.6  NEUTROABS 6.9  HGB 6.5*  HCT 20.2*  MCV 101.0*  PLT 194   Basic Metabolic Panel: Recent Labs  Lab 01/01/24 1914  NA 137  K 2.5*  CL 98  CO2 25  GLUCOSE 148*  BUN 20  CREATININE 2.49*  CALCIUM  8.3*   GFR: Estimated Creatinine Clearance: 12.9 mL/min (A) (by C-G formula based on SCr of 2.49 mg/dL (H)). Liver Function Tests: Recent Labs  Lab 01/01/24 1914  AST 46*  ALT 35  ALKPHOS 228*  BILITOT 1.3*  PROT 7.0  ALBUMIN  2.9*   No results for input(s): LIPASE, AMYLASE in the last 168 hours. No results for input(s): AMMONIA in the last 168 hours. Coagulation Profile: No results for input(s): INR, PROTIME in  the last 168 hours. Cardiac Enzymes: No results for input(s): CKTOTAL, CKMB, CKMBINDEX, TROPONINI in the last 168 hours. BNP (last 3 results) No results for input(s): PROBNP in the last 8760 hours. HbA1C: No results for input(s): HGBA1C in the last 72 hours. CBG: Recent Labs  Lab 01/01/24 1815 01/01/24 1904  GLUCAP 95 111*   Lipid Profile: No results for input(s): CHOL, HDL, LDLCALC, TRIG, CHOLHDL, LDLDIRECT in the last 72 hours. Thyroid  Function Tests: No results for input(s): TSH, T4TOTAL, FREET4, T3FREE, THYROIDAB in the last 72 hours. Anemia Panel: No results for input(s): VITAMINB12, FOLATE, FERRITIN, TIBC, IRON , RETICCTPCT in the last 72 hours. Urine analysis:    Component Value Date/Time   COLORURINE YELLOW 12/16/2012 1919   APPEARANCEUR CLOUDY (A) 12/16/2012 1919   LABSPEC 1.009 12/16/2012 1919   PHURINE 7.5 12/16/2012 1919   GLUCOSEU  NEGATIVE 12/16/2012 1919   HGBUR TRACE (A) 12/16/2012 1919   BILIRUBINUR NEGATIVE 12/16/2012 1919   KETONESUR NEGATIVE 12/16/2012 1919   PROTEINUR 100 (A) 12/16/2012 1919   UROBILINOGEN 0.2 12/16/2012 1919   NITRITE NEGATIVE 12/16/2012 1919   LEUKOCYTESUR SMALL (A) 12/16/2012 1919   Sepsis Labs: @LABRCNTIP (procalcitonin:4,lacticidven:4) )No results found for this or any previous visit (from the past 240 hours).   Radiological Exams on Admission: No results found.  EKG: Independently reviewed. Sinus rhythm, PAC, LBBB.   Assessment/Plan   1. Symptomatic anemia  - Hgb is 6.5; she has recurrent GIB/anemia attributed to AVMs and is managed with monthly octreotide  injections and as-needed blood transfusions  - Repeat CBC after transfusion     2. Hypoglycemia; type II DM  - She has not been hypoglycemic since arrival in ED  - Check CBGs, treat as-needed   3. ESRD  - Completed HD 01/01/24  - Renally-dose medications, restrict fluids   4. Hypokalemia  - Replacing   5. CAD  - No anginal symptoms  - Continue Imdur , Crestor , Toprol   6. PAF  - Continue Toprol     DVT prophylaxis: SCDs  Code Status: DNR/DNI  Level of Care: Level of care: Telemetry Family Communication: husband at bedside  Disposition Plan:  Patient is from: home  Anticipated d/c is to: Home  Anticipated d/c date is: 01/02/24  Patient currently: Pending improved H&H, stable blood glucose  Consults called: None Admission status: observation    Evalene GORMAN Sprinkles, MD [facility]  01/01/2024, 9:50 PM

## 2024-01-01 NOTE — ED Triage Notes (Signed)
 Pt brought in by husband for hypoglycemia. Pt will not respond to any of my questions or follow commands. Pt blood sugar here was 95.

## 2024-01-01 NOTE — ED Notes (Signed)
 ED TO INPATIENT HANDOFF REPORT  ED Nurse Name and Phone #: Sherlean Medic  S Name/Age/Gender Shawna [name] 83 y.o. female Room/Bed: APA02/APA02  Code Status   Code Status: Limited: Do not attempt resuscitation (DNR) -DNR-LIMITED -Do Not Intubate/DNI   Home/SNF/Other Home Patient oriented to: self and place Is this baseline? Yes   Triage Complete: Triage complete  Chief Complaint Symptomatic anemia [D64.9]  Triage Note Pt brought in by husband for hypoglycemia. Pt will not respond to any of my questions or follow commands. Pt blood sugar here was 95.   Allergies Allergies  Allergen Reactions   Amlodipine  Swelling   Aspirin  Other (See Comments)    High Doses Mess up her stomach; makes my bowels have blood in them. Takes 81 mg EC Aspirin     Nitrofurantoin Hives   Ranexa  [Ranolazine ] Other (See Comments)    Myoclonus-hospitalized    Bactrim [Sulfamethoxazole-Trimethoprim] Rash   Gabapentin  Other (See Comments)    Unknown reaction   Iodinated Contrast Media Itching and Other (See Comments)   Iron  Itching and Other (See Comments)    they gave me iron  in dialysis; had to give me Benadryl  cause I had to have the iron  (05/02/2012)   Sucroferric Oxyhydroxide Other (See Comments)    Unknown   Tylenol  [Acetaminophen ] Itching and Other (See Comments)    Makes her feet on fire per pt - can tolerate per pt   Levaquin  [Levofloxacin  In D5w] Rash   Levofloxacin  Rash   Plavix  [Clopidogrel  Bisulfate] Rash   Protonix  [Pantoprazole  Sodium] Rash   Venofer [Ferric Oxide] Itching and Other (See Comments)    Patient reports using Benadryl  prior to doses as [facility]    Level of Care/Admitting Diagnosis ED Disposition     ED Disposition  Admit   Condition  --   Comment  Hospital Area: [facility] [[id number]]  Level of Care: Stepdown [14]  Covid Evaluation: Asymptomatic - no recent exposure (last 10 days) testing not required  Diagnosis: Symptomatic anemia  [[id number]]  Admitting Physician: CHARLTON EVALENE RAMAN [[id number]]  Attending Physician: CHARLTON EVALENE RAMAN [[id number]]          B Medical/Surgery History Past Medical History:  Diagnosis Date   Acute on chronic respiratory failure with hypoxia (HCC) 10/10/2016   Anxiety    Arthritis    AVM (arteriovenous malformation) of colon    CAD (coronary artery disease)    a. s/p CABG in 2013 b. DES to D1 in 10/2016. c. cath in 07/2018 showing patent grafts with occlusion of D1 at prior stent site and progression of PDA disease --> medical management recommended   Carotid artery disease (HCC)    a. 60-79% LICA, 03/2012    Chronic bronchitis (HCC)    Chronic HFrEF (heart failure with reduced ejection fraction) (HCC)    Colon cancer (HCC) 1992   Esophageal stricture    ESRD on hemodialysis (HCC)    ESRD due to HTN, started dialysis 2011 and gets HD at [facility] with Dr Edwardo on MWF schedule.  Access is LUA AVF as of Sept 2014.    GERD (gastroesophageal reflux disease)    High cholesterol 12/2011   History of blood transfusion 07/2011; 12/2011; 01/2012 X 2; 04/2012   History of gout    History of lower GI bleeding    Hypertension    Iron  deficiency anemia    Jugular vein occlusion, right (HCC)    Mitral regurgitation    a. Moderate by echo, 02/2012  Mitral valve disease    NSVT (nonsustained ventricular tachycardia) (HCC)    Ovarian cancer (HCC) 1992   PAF (paroxysmal atrial fibrillation) (HCC)    Pneumonia ~ 2009   PUD (peptic ulcer disease)    TIA (transient ischemic attack)    Tricuspid valve disease    Past Surgical History:  Procedure Laterality Date   A/V FISTULAGRAM Left 10/04/2022   Procedure: A/V Fistulagram;  Surgeon: Jama Cordella MATSU, MD;  Location: [facility] INVASIVE CV LAB;  Service: Cardiovascular;  Laterality: Left;   A/V SHUNTOGRAM Left 03/19/2019   Procedure: A/V SHUNTOGRAM;  Surgeon: Jama Cordella MATSU, MD;  Location: [facility] INVASIVE CV LAB;  Service: Cardiovascular;  Laterality:  Left;   ABDOMINAL HYSTERECTOMY  1992   APPENDECTOMY  06/1990   AV FISTULA PLACEMENT  07/2009   left upper arm   AV FISTULA PLACEMENT Right 09/06/2016   Procedure: RIGHT FOREARM ARTERIOVENOUS (AV) GRAFT;  Surgeon: Carlin FORBES Haddock, MD;  [facility] OR;  Service: Vascular;  Laterality: Right;   AV FISTULA PLACEMENT N/A 02/24/2017   Procedure: INSERTION OF ARTERIOVENOUS (AV) GORE-TEX GRAFT ARM (BRACHIAL AXILLARY);  Surgeon: Jama Cordella MATSU, MD;  Location: [facility] ORS;  Service: Vascular;  Laterality: N/A;   AVGG REMOVAL Right 09/06/2016   Procedure: REMOVAL OF Right Arm ARTERIOVENOUS GORETEX GRAFT and Vein Patch angioplasty of brachial artery;  Surgeon: Lonni GORMAN Blade, MD;  Location: [facility] OR;  Service: Vascular;  Laterality: Right;   BIOPSY  09/26/2019   Procedure: BIOPSY;  Surgeon: Golda Claudis PENNER, MD;  [facility];  Service: Endoscopy;;   BIOPSY  11/01/2022   Procedure: BIOPSY;  Surgeon: Eartha Angelia Sieving, MD;  [facility];  Service: Gastroenterology;;   COLON RESECTION  1992   COLON SURGERY     COLONOSCOPY N/A 03/09/2019   Procedure: COLONOSCOPY;  Surgeon: Golda Claudis PENNER, MD;  [facility];  Service: Endoscopy;  Laterality: N/A;   COLONOSCOPY N/A 08/11/2022   Procedure: COLONOSCOPY;  Surgeon: Albertus Gordy HERO, MD;  Location: [facility] ENDOSCOPY;  Service: Gastroenterology;  Laterality: N/A;   COLONOSCOPY WITH PROPOFOL  N/A 06/08/2021   Procedure: COLONOSCOPY WITH PROPOFOL ;  Surgeon: Eartha Angelia Sieving, MD;  [facility];  Service: Gastroenterology;  Laterality: N/A;  9:05 /Patient is on dialysis Mon Wed Fri   CORONARY ANGIOPLASTY WITH STENT PLACEMENT  12/15/11   2   CORONARY ANGIOPLASTY WITH STENT PLACEMENT  y/2013   1; makes total of 3 (05/02/2012)   CORONARY ARTERY BYPASS GRAFT  06/13/2012   Procedure: CORONARY ARTERY BYPASS GRAFTING (CABG);  Surgeon: Dallas KATHEE Jude, MD;  Location: [facility] OR;  Service: Open Heart Surgery;  Laterality:  N/A;  cabg x four;  using left internal mammary artery, and left leg greater saphenous vein harvested endoscopically   CORONARY STENT INTERVENTION N/A 10/13/2016   Procedure: Coronary Stent Intervention;  Surgeon: Debby DELENA Sor, MD;  [facility];  Service: Cardiovascular;  Laterality: N/A;   DIALYSIS/PERMA CATHETER REMOVAL N/A 04/18/2017   Procedure: DIALYSIS/PERMA CATHETER REMOVAL;  Surgeon: Jama Cordella MATSU, MD;  Location: [facility] INVASIVE CV LAB;  Service: Cardiovascular;  Laterality: N/A;   DILATION AND CURETTAGE OF UTERUS     ENTEROSCOPY N/A 06/08/2021   Procedure: PUSH ENTEROSCOPY;  Surgeon: Eartha Angelia Sieving, MD;  [facility];  Service: Gastroenterology;  Laterality: N/A;   ENTEROSCOPY N/A 11/01/2022   Procedure: ENTEROSCOPY;  Surgeon: Eartha Angelia Sieving, MD;  [facility];  Service: Gastroenterology;  Laterality:  N/A;   ENTEROSCOPY N/A 12/01/2022   Procedure: ENTEROSCOPY;  Surgeon: Leigh Elspeth SQUIBB, MD;  Location: [facility] ENDOSCOPY;  Service: Gastroenterology;  Laterality: N/A;   ESOPHAGOGASTRODUODENOSCOPY  01/20/2012   Procedure: ESOPHAGOGASTRODUODENOSCOPY (EGD);  Surgeon: Gwendlyn ONEIDA Buddy, MD,FACG;  Location: [facility] ENDOSCOPY;  Service: Endoscopy;  Laterality: N/A;   ESOPHAGOGASTRODUODENOSCOPY N/A 03/26/2013   Procedure: ESOPHAGOGASTRODUODENOSCOPY (EGD);  Surgeon: Norleen LOISE Kiang, MD;  Location: [facility] ENDOSCOPY;  Service: Endoscopy;  Laterality: N/A;   ESOPHAGOGASTRODUODENOSCOPY N/A 04/30/2015   Procedure: ESOPHAGOGASTRODUODENOSCOPY (EGD);  Surgeon: Claudis RAYMOND Rivet, MD;  [facility];  Service: Endoscopy;  Laterality: N/A;  1pm - moved to 10/20 @ 1:10   ESOPHAGOGASTRODUODENOSCOPY N/A 07/29/2016   Procedure: ESOPHAGOGASTRODUODENOSCOPY (EGD);  Surgeon: Elspeth Deward Leigh, MD;  Location: [facility] ENDOSCOPY;  Service: Gastroenterology;  Laterality: N/A;  enteroscopy   ESOPHAGOGASTRODUODENOSCOPY N/A 09/26/2019   Procedure:  ESOPHAGOGASTRODUODENOSCOPY (EGD);  Surgeon: Rivet Claudis RAYMOND, MD;  [facility];  Service: Endoscopy;  Laterality: N/A;  1250   ESOPHAGOGASTRODUODENOSCOPY N/A 08/11/2022   Procedure: ESOPHAGOGASTRODUODENOSCOPY (EGD);  Surgeon: Albertus Gordy HERO, MD;  Location: [facility] ENDOSCOPY;  Service: Gastroenterology;  Laterality: N/A;   ESOPHAGOGASTRODUODENOSCOPY (EGD) WITH PROPOFOL  N/A 02/05/2021   Procedure: ESOPHAGOGASTRODUODENOSCOPY (EGD) WITH PROPOFOL ;  Surgeon: Cindie Carlin POUR, DO;  [facility];  Service: Endoscopy;  Laterality: N/A;   ESOPHAGOGASTRODUODENOSCOPY (EGD) WITH PROPOFOL  N/A 08/27/2022   Procedure: ESOPHAGOGASTRODUODENOSCOPY (EGD) WITH PROPOFOL ;  Surgeon: Cindie Carlin POUR, DO;  [facility];  Service: Endoscopy;  Laterality: N/A;   GIVENS CAPSULE STUDY N/A 03/07/2019   Procedure: GIVENS CAPSULE STUDY;  Surgeon: Rivet Claudis RAYMOND, MD;  [facility];  Service: Endoscopy;  Laterality: N/A;  7:30   GIVENS CAPSULE STUDY N/A 04/22/2021   Procedure: GIVENS CAPSULE STUDY;  Surgeon: Rivet Claudis RAYMOND, MD;  [facility];  Service: Endoscopy;  Laterality: N/A;  7:30   GIVENS CAPSULE STUDY N/A 08/27/2022   Procedure: GIVENS CAPSULE STUDY;  Surgeon: Cindie Carlin POUR, DO;  [facility];  Service: Endoscopy;  Laterality: N/A;   HOT HEMOSTASIS N/A 08/11/2022   Procedure: HOT HEMOSTASIS (ARGON PLASMA COAGULATION/BICAP);  Surgeon: Albertus Gordy HERO, MD;  Location: [facility] ENDOSCOPY;  Service: Gastroenterology;  Laterality: N/A;   HOT HEMOSTASIS N/A 12/01/2022   Procedure: HOT HEMOSTASIS (ARGON PLASMA COAGULATION/BICAP);  Surgeon: Leigh Elspeth SQUIBB, MD;  Location: [facility] ENDOSCOPY;  Service: Gastroenterology;  Laterality: N/A;   INSERTION OF DIALYSIS CATHETER N/A 10/05/2020   Procedure: ABORTED TUNNELED DIALYSIS CATHETER PLACEMENT RIGHT INTERNAL JUGULAR VEIN ;  Surgeon: Kallie Manuelita BROCKS, MD;  Location: [facility] ORS;  Service: General;  Laterality: N/A;   INTRAOPERATIVE  TRANSESOPHAGEAL ECHOCARDIOGRAM  06/13/2012   Procedure: INTRAOPERATIVE TRANSESOPHAGEAL ECHOCARDIOGRAM;  Surgeon: Dallas KATHEE Jude, MD;  Location: [facility] OR;  Service: Open Heart Surgery;  Laterality: N/A;   IR DIALY SHUNT INTRO NEEDLE/INTRACATH INITIAL W/IMG LEFT Left 10/06/2020   IR FLUORO GUIDE CV LINE RIGHT  06/17/2020   IR FLUORO GUIDE CV LINE RIGHT  11/12/2022   IR GENERIC HISTORICAL  07/26/2016   IR FLUORO GUIDE CV LINE RIGHT 07/26/2016 Ozell Specking, MD [facility]   IR GENERIC HISTORICAL  07/26/2016   IR US  GUIDE VASC ACCESS RIGHT 07/26/2016 Ozell Specking, MD [facility]   IR GENERIC HISTORICAL  08/02/2016   IR US  GUIDE VASC ACCESS RIGHT 08/02/2016 Ozell Specking, MD [facility]   IR GENERIC HISTORICAL  08/02/2016   IR FLUORO GUIDE CV LINE RIGHT 08/02/2016 Ozell Specking, MD [facility]  IR RADIOLOGY PERIPHERAL GUIDED IV START  03/28/2017   IR REMOVAL TUN CV CATH W/O FL  08/11/2020   IR REMOVAL TUN CV CATH W/O FL  11/15/2022   IR THROMBECTOMY AV FISTULA W/THROMBOLYSIS INC/SHUNT/IMG LEFT Left 06/17/2020   IR US  GUIDE VASC ACCESS LEFT  06/17/2020   IR US  GUIDE VASC ACCESS RIGHT  03/28/2017   IR US  GUIDE VASC ACCESS RIGHT  06/17/2020   IR US  GUIDE VASC ACCESS RIGHT  11/12/2022   LEFT HEART CATH AND CORONARY ANGIOGRAPHY N/A 09/20/2016   Procedure: Left Heart Cath and Coronary Angiography;  Surgeon: Victory LELON Sharps, MD;  Location: [facility] INVASIVE CV LAB;  Service: Cardiovascular;  Laterality: N/A;   LEFT HEART CATH AND CORS/GRAFTS ANGIOGRAPHY N/A 10/13/2016   Procedure: Left Heart Cath and Cors/Grafts Angiography;  Surgeon: Debby DELENA Sor, MD;  [facility];  Service: Cardiovascular;  Laterality: N/A;   LEFT HEART CATH AND CORS/GRAFTS ANGIOGRAPHY N/A 07/13/2018   Procedure: LEFT HEART CATH AND CORS/GRAFTS ANGIOGRAPHY;  Surgeon: Swaziland, Peter M, MD;  Location: [facility] INVASIVE CV LAB;  Service: Cardiovascular;  Laterality: N/A;   LEFT HEART CATH AND CORS/GRAFTS ANGIOGRAPHY N/A 07/22/2021   Procedure: LEFT  HEART CATH AND CORS/GRAFTS ANGIOGRAPHY;  Surgeon: Court Dorn PARAS, MD;  [facility] CV LAB;  Service: Cardiovascular;  Laterality: N/A;   LEFT HEART CATHETERIZATION WITH CORONARY ANGIOGRAM N/A 12/15/2011   Procedure: LEFT HEART CATHETERIZATION WITH CORONARY ANGIOGRAM;  Surgeon: Lonni JONETTA Cash, MD;  Location: [facility] CATH LAB;  Service: Cardiovascular;  Laterality: N/A;   LEFT HEART CATHETERIZATION WITH CORONARY ANGIOGRAM N/A 01/10/2012   Procedure: LEFT HEART CATHETERIZATION WITH CORONARY ANGIOGRAM;  Surgeon: Peter M Swaziland, MD;  Location: [facility] CATH LAB;  Service: Cardiovascular;  Laterality: N/A;   LEFT HEART CATHETERIZATION WITH CORONARY ANGIOGRAM N/A 06/08/2012   Procedure: LEFT HEART CATHETERIZATION WITH CORONARY ANGIOGRAM;  Surgeon: Lonni JONETTA Cash, MD;  Location: [facility] CATH LAB;  Service: Cardiovascular;  Laterality: N/A;   LEFT HEART CATHETERIZATION WITH CORONARY/GRAFT ANGIOGRAM N/A 12/10/2013   Procedure: LEFT HEART CATHETERIZATION WITH EL BILE;  Surgeon: Candyce GORMAN Reek, MD;  Location: [facility] CATH LAB;  Service: Cardiovascular;  Laterality: N/A;   OVARY SURGERY     ovarian cancer   POLYPECTOMY  03/09/2019   Procedure: POLYPECTOMY;  Surgeon: Golda Claudis PENNER, MD;  [facility];  Service: Endoscopy;;  cecal    POLYPECTOMY N/A 09/26/2019   Procedure: DUODENAL POLYPECTOMY;  Surgeon: Golda Claudis PENNER, MD;  [facility];  Service: Endoscopy;  Laterality: N/A;   POLYPECTOMY  08/11/2022   Procedure: POLYPECTOMY;  Surgeon: Albertus Gordy HERO, MD;  Location: [facility] ENDOSCOPY;  Service: Gastroenterology;;   REVISION OF ARTERIOVENOUS GORETEX GRAFT N/A 02/24/2017   Procedure: REVISION OF ARTERIOVENOUS GORETEX GRAFT (RESECTION);  Surgeon: Jama Cordella MATSU, MD;  Location: [facility] ORS;  Service: Vascular;  Laterality: N/A;   REVISON OF ARTERIOVENOUS FISTULA Left 06/19/2020   Procedure: REVISION OF LEFT UPPER ARM AV GRAFT WITH INTERPOSITION JUMP GRAFT USING GORE  LIMB;  Surgeon: Gretta Lonni PARAS, MD;  Location: [facility] OR;  Service: Vascular;  Laterality: Left;   SHUNTOGRAM N/A 10/15/2013   Procedure: Fistulogram;  Surgeon: Gaile LELON New, MD;  Location: [facility] CATH LAB;  Service: Cardiovascular;  Laterality: N/A;   SUBMUCOSAL TATTOO INJECTION  11/01/2022   Procedure: SUBMUCOSAL TATTOO INJECTION;  Surgeon: Eartha Angelia Sieving, MD;  [facility];  Service: Gastroenterology;;   THROMBECTOMY / ARTERIOVENOUS GRAFT REVISION  2011  left upper arm   TUBAL LIGATION  1980's   UPPER EXTREMITY ANGIOGRAPHY Bilateral 12/06/2016   Procedure: Upper Extremity Angiography;  Surgeon: Jama Cordella MATSU, MD;  Location: [facility] INVASIVE CV LAB;  Service: Cardiovascular;  Laterality: Bilateral;   UPPER EXTREMITY INTERVENTION Left 06/06/2017   Procedure: UPPER EXTREMITY INTERVENTION;  Surgeon: Jama Cordella MATSU, MD;  Location: [facility] INVASIVE CV LAB;  Service: Cardiovascular;  Laterality: Left;   UPPER EXTREMITY VENOGRAPHY Left 10/04/2022   Procedure: UPPER EXTREMITY VENOGRAPHY;  Surgeon: Jama Cordella MATSU, MD;  Location: [facility] INVASIVE CV LAB;  Service: Cardiovascular;  Laterality: Left;     A IV Location/Drains/Wounds Patient Lines/Drains/Airways Status     Active Line/Drains/Airways     Name Placement date Placement time Site Days   Peripheral IV 01/01/24 20 G Anterior;Right Forearm 01/01/24  2125  Forearm  less than 1   Fistula / Graft Left Upper arm Arteriovenous fistula 03/19/19  --  Upper arm  1749   Fistula / Graft Left Upper arm 08/21/23  0800  Upper arm  133   Hemodialysis Catheter Left Subclavian Double lumen Temporary (Non-Tunneled) 06/09/22  --  Subclavian  571   Hemodialysis Catheter Double lumen Permanent (Tunneled) --  --  --  --            Intake/Output Last 24 hours  Intake/Output Summary (Last 24 hours) at 01/01/2024 2335 Last data filed at 01/01/2024 2322 Gross per 24 hour  Intake 200 ml  Output --  Net 200 ml     Labs/Imaging Results for orders placed or performed during the hospital encounter of 01/01/24 (from the past 48 hours)  CBG monitoring, ED     Status: None   Collection Time: 01/01/24  6:15 PM  Result Value Ref Range   Glucose-Capillary 95 70 - 99 mg/dL    Comment: Glucose reference range applies only to samples taken after fasting for at least 8 hours.  CBG monitoring, ED     Status: Abnormal   Collection Time: 01/01/24  7:04 PM  Result Value Ref Range   Glucose-Capillary 111 (H) 70 - 99 mg/dL    Comment: Glucose reference range applies only to samples taken after fasting for at least 8 hours.  Comprehensive metabolic panel     Status: Abnormal   Collection Time: 01/01/24  7:14 PM  Result Value Ref Range   Sodium 137 135 - 145 mmol/L   Potassium 2.5 (LL) 3.5 - 5.1 mmol/L    Comment: CRITICAL RESULT CALLED TO, READ BACK BY AND VERIFIED WITH Yovanni Frenette,C ON 01/01/24 AT 2026 BY LOY,C   Chloride 98 98 - 111 mmol/L   CO2 25 22 - 32 mmol/L   Glucose, Bld 148 (H) 70 - 99 mg/dL    Comment: Glucose reference range applies only to samples taken after fasting for at least 8 hours.   BUN 20 8 - 23 mg/dL   Creatinine, Ser 7.50 (H) 0.44 - 1.00 mg/dL   Calcium  8.3 (L) 8.9 - 10.3 mg/dL   Total Protein 7.0 6.5 - 8.1 g/dL   Albumin  2.9 (L) 3.5 - 5.0 g/dL   AST 46 (H) 15 - 41 U/L   ALT 35 0 - 44 U/L   Alkaline Phosphatase 228 (H) 38 - 126 U/L   Total Bilirubin 1.3 (H) 0.0 - 1.2 mg/dL   GFR, Estimated 19 (L) >60 mL/min    Comment: (NOTE) Calculated using the CKD-EPI Creatinine Equation (2021)    Anion gap 14 5 - 15  Comment: Performed at [facility], [address], [place], [place] [address]  CBC with Differential     Status: Abnormal   Collection Time: 01/01/24  7:14 PM  Result Value Ref Range   WBC 8.6 4.0 - 10.5 K/uL   RBC 2.00 (L) 3.87 - 5.11 MIL/uL   Hemoglobin 6.5 (LL) 12.0 - 15.0 g/dL    Comment: REPEATED TO VERIFY THIS CRITICAL RESULT HAS VERIFIED AND BEEN CALLED TO Jannie Doyle  BY ANDREA SNYDER ON 06 23 2025 AT 2013, AND HAS BEEN READ BACK.     HCT 20.2 (L) 36.0 - 46.0 %   MCV 101.0 (H) 80.0 - 100.0 fL   MCH 32.5 26.0 - 34.0 pg   MCHC 32.2 30.0 - 36.0 g/dL   RDW 71.9 (H) 88.4 - 84.4 %   Platelets 194 150 - 400 K/uL   nRBC 0.0 0.0 - 0.2 %   Neutrophils Relative % 81 %   Neutro Abs 6.9 1.7 - 7.7 K/uL   Lymphocytes Relative 8 %   Lymphs Abs 0.7 0.7 - 4.0 K/uL   Monocytes Relative 8 %   Monocytes Absolute 0.7 0.1 - 1.0 K/uL   Eosinophils Relative 3 %   Eosinophils Absolute 0.3 0.0 - 0.5 K/uL   Basophils Relative 0 %   Basophils Absolute 0.0 0.0 - 0.1 K/uL   WBC Morphology MORPHOLOGY UNREMARKABLE    RBC Morphology See Note     Comment: ANISOCYTOSIS   Smear Review PLATELETS APPEAR ADEQUATE     Comment: PLATELET COUNT CONFIRMED BY SMEAR   Immature Granulocytes 0 %   Abs Immature Granulocytes 0.03 0.00 - 0.07 K/uL   Polychromasia PRESENT    Ovalocytes PRESENT     Comment: Performed at [facility], [address] [place], [place] [address]  Type and screen [facility]     Status: None   Collection Time: 01/01/24  7:14 PM  Result Value Ref Range   ABO/RH(D) O POS    Antibody Screen NEG    Sample Expiration      01/04/2024,2359 Performed at [facility], [address], [place], [place] [address]   Prepare RBC (crossmatch)     Status: None   Collection Time: 01/01/24  7:14 PM  Result Value Ref Range   Order Confirmation      ORDER PROCESSED BY BLOOD BANK Performed at [facility], [address], [place], [place] [address]   CBG monitoring, ED     Status: Abnormal   Collection Time: 01/01/24 10:27 PM  Result Value Ref Range   Glucose-Capillary 157 (H) 70 - 99 mg/dL    Comment: Glucose reference range applies only to samples taken after fasting for at least 8 hours.   *Note: Due to a large number of results and/or encounters for the requested time period, some results have not been displayed. A complete set of results can be found in Results  Review.   No results found.  Pending Labs Unresulted Labs (From admission, onward)     Start     Ordered   01/02/24 0500  Hemoglobin A1c  Tomorrow morning,   R       Comments: To assess prior glycemic control    01/01/24 2150   01/02/24 0500  Basic metabolic panel  Tomorrow morning,   R        01/01/24 2150   01/02/24 0500  CBC  Daily,   R      01/01/24 2150   01/01/24 2315  MRSA Next Gen by PCR, Nasal  Once,   R        01/01/24 2314            Vitals/Pain Today's Vitals   01/01/24 1817 01/01/24 1817 01/01/24 1952  BP:  (!) 186/62   Pulse:  (!) 59   Resp:  18   Temp:   99.2 F (37.3 C)  TempSrc:   Oral  SpO2:  95%   Weight: 110 lb 3.7 oz (50 kg)    Height: 5' 1 (1.549 m)    PainSc: 0-No pain      Isolation Precautions No active isolations  Medications Medications  potassium chloride  10 mEq in 100 mL IVPB (10 mEq Intravenous New Bag/Given 01/01/24 2323)  0.9 %  sodium chloride  infusion (Manually program via Guardrails IV Fluids) (0 mLs Intravenous Hold 01/01/24 2128)  isosorbide  mononitrate (IMDUR ) 24 hr tablet 30 mg (has no administration in time range)  metoprolol  succinate (TOPROL -XL) 24 hr tablet 25 mg (25 mg Oral Given 01/01/24 2230)  rosuvastatin  (CRESTOR ) tablet 10 mg (has no administration in time range)  hydrOXYzine  (ATARAX ) tablet 25 mg (has no administration in time range)  levothyroxine  (SYNTHROID ) tablet 50 mcg (has no administration in time range)  levETIRAcetam  (KEPPRA ) tablet 500 mg (has no administration in time range)  albuterol  (PROVENTIL ) (2.5 MG/3ML) 0.083% nebulizer solution 2.5 mg (has no administration in time range)  insulin  aspart (novoLOG ) injection 0-5 Units ( Subcutaneous Not Given 01/01/24 2228)  insulin  aspart (novoLOG ) injection 0-6 Units (has no administration in time range)  sodium chloride  flush (NS) 0.9 % injection 3 mL (3 mLs Intravenous Not Given 01/01/24 2219)  acetaminophen  (TYLENOL ) tablet 650 mg (has no administration in  time range)    Or  acetaminophen  (TYLENOL ) suppository 650 mg (has no administration in time range)  Chlorhexidine  Gluconate Cloth 2 % PADS 6 each (has no administration in time range)    Mobility      Focused Assessments    R Recommendations: See Admitting Provider Note  Report given to:   Additional Notes:

## 2024-01-02 DIAGNOSIS — N186 End stage renal disease: Secondary | ICD-10-CM | POA: Diagnosis not present

## 2024-01-02 DIAGNOSIS — I480 Paroxysmal atrial fibrillation: Secondary | ICD-10-CM | POA: Diagnosis not present

## 2024-01-02 DIAGNOSIS — D649 Anemia, unspecified: Secondary | ICD-10-CM | POA: Diagnosis not present

## 2024-01-02 DIAGNOSIS — E876 Hypokalemia: Secondary | ICD-10-CM | POA: Diagnosis not present

## 2024-01-02 LAB — CBC
HCT: 39.1 % (ref 36.0–46.0)
Hemoglobin: 13.3 g/dL (ref 12.0–15.0)
MCH: 32.7 pg (ref 26.0–34.0)
MCHC: 34 g/dL (ref 30.0–36.0)
MCV: 96.1 fL (ref 80.0–100.0)
Platelets: 147 10*3/uL — ABNORMAL LOW (ref 150–400)
RBC: 4.07 MIL/uL (ref 3.87–5.11)
RDW: 24 % — ABNORMAL HIGH (ref 11.5–15.5)
WBC: 8.9 10*3/uL (ref 4.0–10.5)
nRBC: 0 % (ref 0.0–0.2)

## 2024-01-02 LAB — BASIC METABOLIC PANEL WITH GFR
Anion gap: 10 (ref 5–15)
BUN: 23 mg/dL (ref 8–23)
CO2: 18 mmol/L — ABNORMAL LOW (ref 22–32)
Calcium: 6.7 mg/dL — ABNORMAL LOW (ref 8.9–10.3)
Chloride: 109 mmol/L (ref 98–111)
Creatinine, Ser: 2.4 mg/dL — ABNORMAL HIGH (ref 0.44–1.00)
GFR, Estimated: 20 mL/min — ABNORMAL LOW (ref >60)
Glucose, Bld: 97 mg/dL (ref 70–99)
Potassium: 3.6 mmol/L (ref 3.5–5.1)
Sodium: 137 mmol/L (ref 135–145)

## 2024-01-02 LAB — HEMOGLOBIN A1C
Hgb A1c MFr Bld: 6.4 % — ABNORMAL HIGH (ref 4.8–5.6)
Mean Plasma Glucose: 136.98 mg/dL

## 2024-01-02 LAB — GLUCOSE, CAPILLARY
Glucose-Capillary: 91 mg/dL (ref 70–99)
Glucose-Capillary: 81 mg/dL (ref 70–99)
Glucose-Capillary: 84 mg/dL (ref 70–99)
Glucose-Capillary: 157 mg/dL — ABNORMAL HIGH (ref 70–99)

## 2024-01-02 LAB — MRSA NEXT GEN BY PCR, NASAL: MRSA by PCR Next Gen: NOT DETECTED

## 2024-01-02 MED ORDER — GUAIFENESIN-DM 100-10 MG/5ML PO SYRP
5.0000 mL | ORAL_SOLUTION | ORAL | Status: DC | PRN
  Filled 2024-01-02: qty 5

## 2024-01-02 MED ORDER — FAMOTIDINE IN NACL 20-0.9 MG/50ML-% IV SOLN
20.0000 mg | Freq: Every day | INTRAVENOUS | Status: DC
  Administered 2024-01-02 – 2024-01-07 (×5): 20 mg via INTRAVENOUS
  Filled 2024-01-02 (×6): qty 50

## 2024-01-02 MED ORDER — HYDRALAZINE HCL 25 MG PO TABS
50.0000 mg | ORAL_TABLET | Freq: Four times a day (QID) | ORAL | Status: DC | PRN
  Administered 2024-01-02 – 2024-01-04 (×4): 50 mg via ORAL
  Filled 2024-01-02: qty 1
  Filled 2024-01-02 (×2): qty 2
  Filled 2024-01-02: qty 1

## 2024-01-02 MED ORDER — CHLORHEXIDINE GLUCONATE CLOTH 2 % EX PADS
6.0000 | MEDICATED_PAD | Freq: Every day | CUTANEOUS | Status: DC
Start: 2024-01-03 — End: 2024-01-08
  Administered 2024-01-03 – 2024-01-08 (×6): 6 via TOPICAL

## 2024-01-02 NOTE — Care Management Obs Status (Signed)
 MEDICARE OBSERVATION STATUS NOTIFICATION   Patient Details  Name: Shawna [name] MRN: [id number] Date of Birth: 02-Jan-1941   Medicare Observation Status Notification Given:   yes    Duwaine LITTIE Ada 01/02/2024, 4:45 PM

## 2024-01-02 NOTE — Care Management Obs Status (Signed)
 MEDICARE OBSERVATION STATUS NOTIFICATION   Patient Details  Name: Shawna [name] MRN: [id number] Date of Birth: [birth year]   Medicare Observation Status Notification Given:  Yes    Duwaine LITTIE Ada 01/02/2024, 4:45 PM

## 2024-01-02 NOTE — Plan of Care (Signed)
  Problem: Education: Goal: Knowledge of General Education information will improve Description: Including pain rating scale, medication(s)/side effects and non-pharmacologic comfort measures Outcome: Progressing   Problem: Health Behavior/Discharge Planning: Goal: Ability to manage health-related needs will improve Outcome: Progressing   Problem: Clinical Measurements: Goal: Ability to maintain clinical measurements within normal limits will improve Outcome: Progressing Goal: Will remain free from infection Outcome: Progressing Goal: Diagnostic test results will improve Outcome: Progressing Goal: Respiratory complications will improve Outcome: Progressing Goal: Cardiovascular complication will be avoided Outcome: Progressing   Problem: Activity: Goal: Risk for activity intolerance will decrease Outcome: Progressing   Problem: Nutrition: Goal: Adequate nutrition will be maintained Outcome: Progressing   Problem: Coping: Goal: Level of anxiety will decrease Outcome: Progressing   Problem: Elimination: Goal: Will not experience complications related to bowel motility Outcome: Progressing Goal: Will not experience complications related to urinary retention Outcome: Progressing   Problem: Pain Managment: Goal: General experience of comfort will improve and/or be controlled Outcome: Progressing   Problem: Safety: Goal: Ability to remain free from injury will improve Outcome: Progressing   Problem: Skin Integrity: Goal: Risk for impaired skin integrity will decrease Outcome: Progressing   Problem: Education: Goal: Ability to describe self-care measures that may prevent or decrease complications (Diabetes Survival Skills Education) will improve Outcome: Progressing Goal: Individualized Educational Video(s) Outcome: Progressing   Problem: Fluid Volume: Goal: Ability to maintain a balanced intake and output will improve Outcome: Progressing   Problem: Health  Behavior/Discharge Planning: Goal: Ability to identify and utilize available resources and services will improve Outcome: Progressing Goal: Ability to manage health-related needs will improve Outcome: Progressing   Problem: Metabolic: Goal: Ability to maintain appropriate glucose levels will improve Outcome: Progressing   Problem: Nutritional: Goal: Maintenance of adequate nutrition will improve Outcome: Progressing Goal: Progress toward achieving an optimal weight will improve Outcome: Progressing   Problem: Skin Integrity: Goal: Risk for impaired skin integrity will decrease Outcome: Progressing   Problem: Tissue Perfusion: Goal: Adequacy of tissue perfusion will improve Outcome: Progressing

## 2024-01-02 NOTE — Progress Notes (Signed)
 Progress Note   Patient: Shawna [name] FMW:[id number] DOB: [birth year] DOA: 01/01/2024     0 DOS: the patient was seen and examined on 01/02/2024   Brief hospital admission narrative: As per H&P written by Dr. Charlton on 01/01/24 Shawna [name] is an 83 y.o. female with medical history significant for AVMs with recurrent GI bleeding, chronic HFpEF, CAD status post CABG, PAF not anticoagulated, colon cancer status postresection, type 2 diabetes mellitus, and ESRD on hemodialysis who presents with low blood glucose.   Patient was reportedly in her usual state of health today at home, ate breakfast, and went to dialysis where she reportedly completed her session.  She was noted to be somnolent following the treatment and CBG was in the low 60s.  She was given food and drink but repeat CBG was still in the low 60s and she was sent to the ED for evaluation.     Patient has no complaints herself.  Patient has been evaluated by GI numerous times in the hospital and receives long-acting octreotide  injections and blood transfusions.   ED Course: Upon arrival to the ED, patient is found to be afebrile and saturating well on room air with stable BP.  Labs are most notable for potassium 2.5, albumin  2.9, and hemoglobin 6.5.   Patient was treated with IV potassium and had 2 units RBC ordered for transfusion from the ED.  Assessment and plan 1-symptomatic acute on chronic blood loss anemia -Hemoglobin at time of admission 6.5 - 2 units PRBC provided - Hemodynamically stable otherwise - Prior history of AVMs; over a year without endoscopic evaluation.  Receiving octreotide  as an outpatient. - Case discussed with GI service they will evaluate patient for possible endoscopic evaluation during this hospitalization - Continue to follow hemoglobin trend - Continue Pepcid ; patient allergy to PPI. - Patient with complaining of chronic anemia from renal failure - IV iron  and Epogen  as per nephrology  discretion.  2-ESRD - Monday, Wednesday and Friday (regular schedule). - Last dialysis treatment 01/01/2024 prior to admission. - Continue renally dosing medications and follow renal diet - Nephrology consulted for continuation of dialysis while inpatient.  3-type 2 diabetes - Mild hypoglycemia appreciated at time of admission - Continue to follow CBGs fluctuation - Sliding scale insulin  will be provided.  4-hypokalemia - Repleted - Continue to follow trend.  5-paroxysmal atrial fibrillation - Continue Toprol  - Not a candidate for anticoagulation therapy with ongoing history of GI bleed.  6-coronary artery disease/hypertension - No anginal symptoms - Continue Imdur , Crestor  and metoprolol  - As needed hydralazine  also ordered.  7-history of seizure - Continue Keppra   8-hypothyroidism - Continue Synthroid   9-GERD - Patient allergic to PPI - Continue Pepcid .   Subjective:  In no acute distress; denies chest pain, no nausea, no vomiting.  Good saturation on room air.  Physical Exam: Vitals:   01/02/24 1300 01/02/24 1500 01/02/24 1524 01/02/24 1600  BP: (!) 209/83 (!) 177/91  (!) 198/70  Pulse: (!) 58 (!) 59  (!) 57  Resp: 17 17  (!) 23  Temp:   98.2 F (36.8 C)   TempSrc:   Axillary   SpO2: 99% 100%  100%  Weight:      Height:       General exam: In no acute distress; no chest pain, no nausea, no vomiting, no shortness of breath. Respiratory system: Good saturation on room air. Cardiovascular system: No rubs or gallops; rate controlled. Gastrointestinal system: Abdomen is nondistended, soft and nontender. No  organomegaly or masses felt. Normal bowel sounds heard. Central nervous system: No focal neurological deficits. Extremities: No cyanosis or clubbing. Skin: No petechiae. Psychiatry: Judgement and insight appear impaired secondary to underlying dementia.  Appropriate mood   Data Reviewed: Basic metabolic panel: Sodium 137, potassium 3.6, chloride 109,  bicarbonate 18, BUN 23, creatinine 2.40 and GFR 20  Family Communication: Husband updated over the phone (01/02/2024).  Disposition: Status is: Observation The patient remains OBS appropriate and will d/c before 2 midnights.  Anticipating discharge back home once medically stable.  Time spent: 50 minutes  Author: Eric Nunnery, MD 01/02/2024 4:57 PM  For on call review [email or web address].

## 2024-01-02 NOTE — TOC CM/SW Note (Signed)
 Transition of Care [facility]) - Inpatient Brief Assessment   Patient Details  Name: Shawna [name] MRN: [id number] Date of Birth: 1941/03/14  Transition of Care [facility]) CM/SW Contact:    Lucie Lunger, LCSWA Phone Number: 01/02/2024, 8:54 AM   Clinical Narrative: Transition of Care Department [facility]) has reviewed patient and no TOC needs have been identified at this time. We will continue to monitor patient advancement through interdiciplinary progression rounds. If new patient transition needs arise, please place a TOC consult.  Transition of Care Asessment: Insurance and Status: Insurance coverage has been reviewed Patient has primary care physician: Yes Home environment has been reviewed: From home Prior level of function:: Independent Prior/Current Home Services: No current home services Social Drivers of Health Review: SDOH reviewed no interventions necessary Readmission risk has been reviewed: Yes Transition of care needs: no transition of care needs at this time

## 2024-01-02 NOTE — Plan of Care (Signed)
   Problem: Education: Goal: Knowledge of General Education information will improve Description Including pain rating scale, medication(s)/side effects and non-pharmacologic comfort measures Outcome: Progressing   Problem: Health Behavior/Discharge Planning: Goal: Ability to manage health-related needs will improve Outcome: Progressing   Problem: Clinical Measurements: Goal: Ability to maintain clinical measurements within normal limits will improve Outcome: Progressing   Problem: Clinical Measurements: Goal: Will remain free from infection Outcome: Progressing

## 2024-01-03 ENCOUNTER — Ambulatory Visit: Admitting: Student

## 2024-01-03 DIAGNOSIS — R195 Other fecal abnormalities: Secondary | ICD-10-CM

## 2024-01-03 DIAGNOSIS — R4182 Altered mental status, unspecified: Secondary | ICD-10-CM

## 2024-01-03 DIAGNOSIS — Z8719 Personal history of other diseases of the digestive system: Secondary | ICD-10-CM

## 2024-01-03 DIAGNOSIS — N186 End stage renal disease: Secondary | ICD-10-CM | POA: Diagnosis not present

## 2024-01-03 DIAGNOSIS — E876 Hypokalemia: Secondary | ICD-10-CM | POA: Diagnosis not present

## 2024-01-03 DIAGNOSIS — D649 Anemia, unspecified: Secondary | ICD-10-CM | POA: Diagnosis not present

## 2024-01-03 DIAGNOSIS — I480 Paroxysmal atrial fibrillation: Secondary | ICD-10-CM | POA: Diagnosis not present

## 2024-01-03 LAB — GLUCOSE, CAPILLARY
Glucose-Capillary: 81 mg/dL (ref 70–99)
Glucose-Capillary: 89 mg/dL (ref 70–99)
Glucose-Capillary: 81 mg/dL (ref 70–99)
Glucose-Capillary: 92 mg/dL (ref 70–99)
Glucose-Capillary: 78 mg/dL (ref 70–99)
Glucose-Capillary: 69 mg/dL — ABNORMAL LOW (ref 70–99)

## 2024-01-03 LAB — BPAM RBC
Blood Product Expiration Date: 202507272359
Blood Product Expiration Date: 202507272359
ISSUE DATE / TIME: 202506240256
ISSUE DATE / TIME: 202506240603
Unit Type and Rh: 5100
Unit Type and Rh: 5100

## 2024-01-03 LAB — RENAL FUNCTION PANEL
Albumin: 3.1 g/dL — ABNORMAL LOW (ref 3.5–5.0)
Anion gap: 12 (ref 5–15)
BUN: 40 mg/dL — ABNORMAL HIGH (ref 8–23)
CO2: 22 mmol/L (ref 22–32)
Calcium: 8.5 mg/dL — ABNORMAL LOW (ref 8.9–10.3)
Chloride: 99 mmol/L (ref 98–111)
Creatinine, Ser: 4.02 mg/dL — ABNORMAL HIGH (ref 0.44–1.00)
GFR, Estimated: 11 mL/min — ABNORMAL LOW (ref >60)
Glucose, Bld: 83 mg/dL (ref 70–99)
Phosphorus: 3.3 mg/dL (ref 2.5–4.6)
Potassium: 3.8 mmol/L (ref 3.5–5.1)
Sodium: 133 mmol/L — ABNORMAL LOW (ref 135–145)

## 2024-01-03 LAB — CBC
HCT: 39.6 % (ref 36.0–46.0)
Hemoglobin: 13.3 g/dL (ref 12.0–15.0)
MCH: 31.7 pg (ref 26.0–34.0)
MCHC: 33.6 g/dL (ref 30.0–36.0)
MCV: 94.3 fL (ref 80.0–100.0)
Platelets: 162 10*3/uL (ref 150–400)
RBC: 4.2 MIL/uL (ref 3.87–5.11)
RDW: 24.4 % — ABNORMAL HIGH (ref 11.5–15.5)
WBC: 5.7 10*3/uL (ref 4.0–10.5)
nRBC: 0 % (ref 0.0–0.2)

## 2024-01-03 LAB — HEPATITIS B SURFACE ANTIGEN: Hepatitis B Surface Ag: NONREACTIVE

## 2024-01-03 LAB — TYPE AND SCREEN
ABO/RH(D): O POS
Antibody Screen: NEGATIVE
Donor AG Type: NEGATIVE
Donor AG Type: NEGATIVE
Unit division: 0
Unit division: 0

## 2024-01-03 MED ORDER — HEPARIN SODIUM (PORCINE) 1000 UNIT/ML IJ SOLN
INTRAMUSCULAR | Status: AC
  Filled 2024-01-03: qty 5

## 2024-01-03 MED ORDER — CINACALCET HCL 30 MG PO TABS
30.0000 mg | ORAL_TABLET | ORAL | Status: DC
  Filled 2024-01-03 (×2): qty 1

## 2024-01-03 MED ORDER — HEPARIN SODIUM (PORCINE) 1000 UNIT/ML DIALYSIS
1000.0000 [IU] | INTRAMUSCULAR | Status: DC | PRN
  Administered 2024-01-03: 3800 [IU]

## 2024-01-03 MED ORDER — ALTEPLASE 2 MG IJ SOLR: 2.0000 mg | Freq: Once | INTRAMUSCULAR | Status: DC | PRN

## 2024-01-03 MED ORDER — DOXERCALCIFEROL 4 MCG/2ML IV SOLN
INTRAVENOUS | Status: AC
  Filled 2024-01-03: qty 2

## 2024-01-03 MED ORDER — DOXERCALCIFEROL 4 MCG/2ML IV SOLN
1.0000 ug | INTRAVENOUS | Status: DC
  Administered 2024-01-03 – 2024-01-08 (×3): 1 ug via INTRAVENOUS
  Filled 2024-01-03: qty 2

## 2024-01-03 MED ORDER — CALCITRIOL 0.25 MCG PO CAPS
2.0000 ug | ORAL_CAPSULE | ORAL | Status: DC
  Filled 2024-01-03: qty 8

## 2024-01-03 NOTE — H&P (View-Only) (Signed)
 Gastroenterology Consult   Referring Provider: No ref. provider found Primary Care Physician:  [facility] Primary Gastroenterologist:  Dr. Eartha   Patient ID: Shawna [name]; [id number]; 07/09/1941   Admit date: 01/01/2024  LOS: 0 days   Date of Consultation: 01/03/2024  Reason for Consultation:  acute on chronic anemia, known history of AVMs   History of Present Illness   Shawna [name] is a 83 y.o. year old female with complex medical history including recurrent GI bleeding secondary to small bowel and gastric AVMs, heart failure, CAD/CABG PAF (not anticoagulated), end-stage renal disease on hemodialysis who was brought to the ED after becoming somnolent during dialysis, CBG found to be 60. Was also told her hgb at dialysis was 6.9. Patient denied any other complaints. Hgb 6.5 on arrival, GI consulted for further evaluation.  ED Course: Hgb 6.5 on arrival s/p 2 units PRBCs up to 13.3  Consult:  Unfortunately patient was altered, agitated upon my encounter. Multiple staff members in the room to try and redirect patient. She was unable to provide any history. I reached out to her significant other Shawna [name]) who states that Monday after dialysis she became lethargic and CBG was lower so she was given something to eat and then he called 911 to take her to the ED. He states that she has been feeling a little weaker recently but no rectal bleeding or melena. He does endorse she has had diarrhea for about 1 week, but has fecal urgency after eating at baseline. He states she has c/o abdominal pain at times but cannot provide much information about this. He reports appetite is not great, she does snack a lot. He reports she is getting octreotide  injection at PCP office, thinks last dose was about 1 week ago, though does note she has gotten off schedule due to being in and out of the [name], unclear if she has missed any injections. He is amenable to her undergoing  endoscopic evaluation this admission for further evaluation.   Enteroscopy: 11/2022: - Esophagogastric landmarks identified.                           - 2 cm hiatal hernia.                           - A few non-bleeding angiodysplastic lesions in the                            stomach. Treated with argon plasma coagulation                            (APC).                           - Normal stomach otherwise                           - Normal examined duodenum.                           - A tattoo was seen in the jejunum with a small  polyp.                           - Jejunal diverticulum - several, one large.                           - Six non-bleeding angiodysplastic lesions in the                            proximal jejunum beyond the tattoo. Treated with                            argon plasma coagulation (APC).                           AVMs are the likely cause of bleeding / anemia for                            this patient with ESRD. Ablated. NO active bleeding  Colonoscopy: 08/2022 - Preparation of the colon was fair.                           - Patent end-to-end ileo-colonic and end to side                            colocolonic anastomoses, characterized by healthy                            appearing mucosa.                           - The examined portion of the neo-terminal ileum                            was normal.                           - Two 5 to 6 mm polyps in the transverse colon,                            removed with a cold snare. Resected and retrieved.                           - Three 4 to 7 mm polyps in the descending colon,                            removed with a cold snare. Resected and retrieved.                           - One 9 mm polyp in the sigmoid colon, removed with                            a cold snare. Resected and retrieved.                           -  Moderate diverticulosis in the sigmoid colon and                             in the descending colon.  Past Medical History:  Diagnosis Date   Acute on chronic respiratory failure with hypoxia (HCC) 10/10/2016   Anxiety    Arthritis    AVM (arteriovenous malformation) of colon    CAD (coronary artery disease)    a. s/p CABG in 2013 b. DES to D1 in 10/2016. c. cath in 07/2018 showing patent grafts with occlusion of D1 at prior stent site and progression of PDA disease --> medical management recommended   Carotid artery disease (HCC)    a. 60-79% LICA, 03/2012    Chronic bronchitis (HCC)    Chronic HFrEF (heart failure with reduced ejection fraction) (HCC)    Colon cancer (HCC) 1992   Esophageal stricture    ESRD on hemodialysis (HCC)    ESRD due to HTN, started dialysis 2011 and gets HD at [name] with Dr Edwardo on MWF schedule.  Access is LUA AVF as of Sept 2014.    GERD (gastroesophageal reflux disease)    High cholesterol 12/2011   History of blood transfusion 07/2011; 12/2011; 01/2012 X 2; 04/2012   History of gout    History of lower GI bleeding    Hypertension    Iron  deficiency anemia    Jugular vein occlusion, right (HCC)    Mitral regurgitation    a. Moderate by echo, 02/2012   Mitral valve disease    NSVT (nonsustained ventricular tachycardia) (HCC)    Ovarian cancer (HCC) 1992   PAF (paroxysmal atrial fibrillation) (HCC)    Pneumonia ~ 2009   PUD (peptic ulcer disease)    TIA (transient ischemic attack)    Tricuspid valve disease    Prior to Admission medications   Medication Sig Start Date End Date Taking? Authorizing Provider  albuterol  (PROVENTIL ) (2.5 MG/3ML) 0.083% nebulizer solution USE 1 VIAL IN NEBULIZER 4 TIMES DAILY AS NEEDED FOR COUGH, WHEEZING, AND SHORTNESS OF BREATH 10/20/23  Yes [provider]  albuterol  (VENTOLIN  HFA) 108 (90 Base) MCG/ACT inhaler SMARTSIG:2 Puff(s) By Mouth Every 4-6 Hours PRN 10/10/23  Yes [provider]  benzonatate  (TESSALON ) 100 MG capsule Take 1 capsule (100 mg total) by  mouth 2 (two) times daily as needed for cough. 11/05/23  Yes Johnson, Clanford L, MD  calcitRIOL  (ROCALTROL ) 0.25 MCG capsule Take 7 capsules (1.75 mcg total) by mouth every Monday, Wednesday, and Friday with hemodialysis. 12/21/22  Yes Christobal Guadalajara, MD  cinacalcet  (SENSIPAR ) 30 MG tablet Take 1 tablet (30 mg total) by mouth every Monday, Wednesday, and Friday with hemodialysis. 12/19/22  Yes Christobal Guadalajara, MD  cyanocobalamin  (VITAMIN B12) 1000 MCG/ML injection Inject 1,000 mcg into the muscle every 30 (thirty) days.   Yes [provider]  famotidine  (PEPCID ) 20 MG tablet Take 1 tablet (20 mg total) by mouth daily as needed for heartburn or indigestion. 09/10/23 09/09/24 Yes Johnson, Clanford L, MD  folic acid  (FOLVITE ) 1 MG tablet Take 1 mg by mouth daily. 05/24/22  Yes [provider]  guaifenesin  (ROBITUSSIN) 100 MG/5ML syrup Take 200 mg by mouth 3 (three) times daily as needed for cough.   Yes [provider]  hydrOXYzine  (ATARAX ) 25 MG tablet Take 25 mg by mouth every 6 (six) hours as needed for itching. 06/06/22  Yes [provider]  isosorbide  mononitrate (IMDUR ) 30 MG  24 hr tablet Take 1 tablet (30 mg total) by mouth daily. 11/15/23  Yes Cindy Garnette POUR, MD  levETIRAcetam  (KEPPRA ) 500 MG tablet Take 1 tablet (500 mg total) by mouth daily. 11/15/22  Yes Dennise Lavada POUR, MD  levothyroxine  (SYNTHROID ) 50 MCG tablet Take 50 mcg by mouth daily. 08/27/23  Yes [provider]  metoprolol  succinate (TOPROL  XL) 25 MG 24 hr tablet Take 1 tablet (25 mg total) by mouth 2 (two) times daily. Take an extra 25 mg for heart rate over 100 11/30/23  Yes Furth, Cadence H, PA-C  multivitamin (RENA-VIT) TABS tablet Take 1 tablet by mouth daily.   Yes [provider]  nitroGLYCERIN  (NITROSTAT ) 0.4 MG SL tablet Place 0.4 mg under the tongue every 5 (five) minutes as needed for chest pain.   Yes [provider]  octreotide  (SANDOSTATIN  LAR) 20 MG injection Inject 20 mg  into the muscle every 28 (twenty-eight) days. 11/15/22  Yes Dennise Lavada POUR, MD  ondansetron  (ZOFRAN -ODT) 4 MG disintegrating tablet Take 4 mg by mouth every 8 (eight) hours as needed for nausea or vomiting. 05/22/23  Yes [provider]  rosuvastatin  (CRESTOR ) 10 MG tablet Take 1 tablet by mouth once daily 06/06/22  Yes Strader, Brittany M, PA-C    Current Facility-Administered Medications  Medication Dose Route Frequency Provider Last Rate Last Admin   0.9 %  sodium chloride  infusion (Manually program via Guardrails IV Fluids)   Intravenous Once Ricky Fines, MD   Held at 01/01/24 2128   acetaminophen  (TYLENOL ) tablet 650 mg  650 mg Oral Q6H PRN Ricky Fines, MD   650 mg at 01/02/24 2110   Or   acetaminophen  (TYLENOL ) suppository 650 mg  650 mg Rectal Q6H PRN Ricky Fines, MD       albuterol  (PROVENTIL ) (2.5 MG/3ML) 0.083% nebulizer solution 2.5 mg  2.5 mg Nebulization Q6H PRN Ricky Fines, MD       Chlorhexidine  Gluconate Cloth 2 % PADS 6 each  6 each Topical Daily Ricky Fines, MD   6 each at 01/02/24 9166   Chlorhexidine  Gluconate Cloth 2 % PADS 6 each  6 each Topical Q0600 Melia Lynwood ORN, MD   6 each at 01/03/24 0533   famotidine  (PEPCID ) IVPB 20 mg premix  20 mg Intravenous q1800 Madera, Carlos, MD 100 mL/hr at 01/02/24 1756 20 mg at 01/02/24 1756   guaiFENesin -dextromethorphan  (ROBITUSSIN DM) 100-10 MG/5ML syrup 5 mL  5 mL Oral Q4H PRN Ricky Fines, MD       hydrALAZINE  (APRESOLINE ) tablet 50 mg  50 mg Oral Q6H PRN Ricky Fines, MD   50 mg at 01/02/24 1314   hydrOXYzine  (ATARAX ) tablet 25 mg  25 mg Oral Q6H PRN Ricky Fines, MD   25 mg at 01/02/24 2110   insulin  aspart (novoLOG ) injection 0-5 Units  0-5 Units Subcutaneous QHS Ricky Fines, MD       insulin  aspart (novoLOG ) injection 0-6 Units  0-6 Units Subcutaneous TID WC Ricky Fines, MD       isosorbide  mononitrate (IMDUR ) 24 hr tablet 30 mg  30 mg Oral Daily Ricky Fines, MD   30 mg at 01/02/24 0830    levETIRAcetam  (KEPPRA ) tablet 500 mg  500 mg Oral Daily Ricky Fines, MD   500 mg at 01/02/24 0830   levothyroxine  (SYNTHROID ) tablet 50 mcg  50 mcg Oral Q0600 Ricky Fines, MD   50 mcg at 01/02/24 0617   metoprolol  succinate (TOPROL -XL) 24 hr tablet 25 mg  25 mg Oral  BID Ricky Fines, MD   25 mg at 01/02/24 2111   rosuvastatin  (CRESTOR ) tablet 10 mg  10 mg Oral Daily Ricky Fines, MD   10 mg at 01/02/24 0830   sodium chloride  flush (NS) 0.9 % injection 3 mL  3 mL Intravenous Q12H Ricky Fines, MD   3 mL at 01/02/24 2112    Allergies as of 01/01/2024 - Review Complete 01/01/2024  Allergen Reaction Noted   Amlodipine  Swelling 01/19/2015   Aspirin  Other (See Comments) 12/15/2011   Nitrofurantoin Hives 07/08/2009   Ranexa  [ranolazine ] Other (See Comments) 08/30/2020   Bactrim [sulfamethoxazole-trimethoprim] Rash 12/15/2011   Gabapentin  Other (See Comments) 02/08/2017   Iodinated contrast media Itching and Other (See Comments) 12/15/2011   Iron  Itching and Other (See Comments) 05/02/2012   Sucroferric oxyhydroxide Other (See Comments) 04/15/2021   Tylenol  [acetaminophen ] Itching and Other (See Comments) 07/08/2016   Levaquin  [levofloxacin  in d5w] Rash 10/04/2013   Levofloxacin  Rash 04/15/2017   Plavix  [clopidogrel  bisulfate] Rash 01/05/2012   Protonix  [pantoprazole  sodium] Rash 03/21/2013   Venofer [ferric oxide] Itching and Other (See Comments) 05/02/2012    Review of Systems   As above, unable to obtain history from patient due to altered mental status, agitation.   Physical Exam   Vital Signs in last 24 hours: Temp:  [97.4 F (36.3 C)-98.7 F (37.1 C)] 97.4 F (36.3 C) (06/25 0427) Pulse Rate:  [53-107] 58 (06/25 0427) Resp:  [14-24] 20 (06/25 0427) BP: (150-217)/(70-93) 190/84 (06/25 0427) SpO2:  [97 %-100 %] 100 % (06/25 0427)   General:   Alert,  Well-developed, well-nourished Head:  Normocephalic and atraumatic. Eyes:  Sclera clear, no icterus.   Conjunctiva  pink. Ears:  Normal auditory acuity. Lungs:  Clear throughout to auscultation.   No wheezes, crackles, or rhonchi. No acute distress. Heart:  Regular rate and rhythm; no murmurs, clicks, rubs,  or gallops. Abdomen:  Soft, nontender and nondistended. No masses, hepatosplenomegaly or hernias noted. Normal bowel sounds, without guarding, and without rebound.   Neurologic:  Alert, confused  Skin:  Intact without significant lesions or rashes. Psych:  Alert, confused, agitated   Labs/Studies   Recent Labs Recent Labs    01/01/24 1914 01/02/24 0947 01/03/24 0423  WBC 8.6 8.9 5.7  HGB 6.5* 13.3 13.3  HCT 20.2* 39.1 39.6  PLT 194 147* 162   BMET Recent Labs    01/01/24 1914 01/02/24 0947 01/03/24 0423  NA 137 137 133*  K 2.5* 3.6 3.8  CL 98 109 99  CO2 25 18* 22  GLUCOSE 148* 97 83  BUN 20 23 40*  CREATININE 2.49* 2.40* 4.02*  CALCIUM  8.3* 6.7* 8.5*   LFT Recent Labs    01/01/24 1914 01/03/24 0423  PROT 7.0  --   ALBUMIN  2.9* 3.1*  AST 46*  --   ALT 35  --   ALKPHOS 228*  --   BILITOT 1.3*  --     Assessment   Shawna [name] is a 83 y.o. year old female  with complex medical history including recurrent GI bleeding secondary to small bowel and gastric AVMs, heart failure, CAD/CABG PAF (not anticoagulated), end-stage renal disease on hemodialysis who was brought to the ED after becoming somnolent during dialysis, CBG found to be 60. Was also told her hgb at dialysis was 6.9. Patient denied any other complaints. Hgb 6.5 on arrival, GI consulted for further evaluation.  Acute on chronic anemia/heme positive stool: -no rectal bleeding/melena per family -maintained on octreotide  SQ as  outpatient -last Enteroscopy in may 2024 with  A few non-bleeding angiodysplastic lesions in the  stomach. Treated with (APC), Six non-bleeding angiodysplastic lesions in the proximal jejunum beyond the tattoo. Treated with (APC), AVMs are the likely cause of bleeding / anemia. Ablated. NO  active bleeding.   -continues to have frequent drops in hgb every few weeks, requiring transfusion -husband denies any melena or BRBPR -questionable if she has missed or gotten off schedule with her octreotide  injections per his report  At this time, recommend attempting push enteroscopy tomorrow if patient is stable as there may be AVMs that can be ablated with APC. Will continue with current outpatient dose of octreotide    Indications, risks and benefits of procedure discussed in detail with patient's. Patient's Husband (DPR) Shawna [name] verbalized understanding and is in agreement to proceed with push enteroscopy tomorrow.    Plan / Recommendations   PPI BID Renal diet today, NPO midnight Will plan for push enteroscopy tomorrow Monitor for overt GI bleeding Trend h&h Continue current dose of octreotide  as outpatient     01/03/2024, 8:52 AM  Shawna [name] L. Linder Prajapati, MSN, APRN, AGNP-C Adult-Gerontology Nurse Practitioner [name]

## 2024-01-03 NOTE — Progress Notes (Signed)
 Progress Note   Patient: Shawna [name] FMW:[id number] DOB: 04/30/1941 DOA: 01/01/2024     0 DOS: the patient was seen and examined on 01/03/2024   Brief hospital admission narrative: As per H&P written by Dr. Charlton on 01/01/24 Shawna [name] is an 83 y.o. female with medical history significant for AVMs with recurrent GI bleeding, chronic HFpEF, CAD status post CABG, PAF not anticoagulated, colon cancer status postresection, type 2 diabetes mellitus, and ESRD on hemodialysis who presents with low blood glucose.   Patient was reportedly in her usual state of health today at home, ate breakfast, and went to dialysis where she reportedly completed her session.  She was noted to be somnolent following the treatment and CBG was in the low 60s.  She was given food and drink but repeat CBG was still in the low 60s and she was sent to the ED for evaluation.     Patient has no complaints herself.  Patient has been evaluated by GI numerous times in the hospital and receives long-acting octreotide  injections and blood transfusions.   ED Course: Upon arrival to the ED, patient is found to be afebrile and saturating well on room air with stable BP.  Labs are most notable for potassium 2.5, albumin  2.9, and hemoglobin 6.5.   Patient was treated with IV potassium and had 2 units RBC ordered for transfusion from the ED.  Assessment and plan 1-symptomatic acute on chronic blood loss anemia -Hemoglobin at time of admission 6.5 - 2 units PRBC provided - Hemodynamically stable otherwise - Prior history of AVMs; over a year without endoscopic evaluation.  Receiving octreotide  as an outpatient. - Appreciate assistance and recommendation by GI; planning for endoscopy evaluation on 01/04/2024 - Continue to follow hemoglobin trend - Continue Pepcid ; patient allergic to PPI. - Patient with complaining of chronic anemia from renal failure - IV iron  and Epogen  as per nephrology discretion.  2-ESRD - Monday, Wednesday  and Friday (regular schedule). - Last dialysis treatment 01/01/2024 prior to admission. - Continue renally dosing medications and follow renal diet - Appreciate assistance and recommendation by nephrology service; planning for dialysis later today.  3-type 2 diabetes - Mild hypoglycemia appreciated at time of admission - Continue to follow CBGs fluctuation - Sliding scale insulin  will be provided.  4-hypokalemia - Repleted - Continue to follow trend.  5-paroxysmal atrial fibrillation - Continue Toprol  - Not a candidate for anticoagulation therapy with ongoing history of GI bleed.  6-coronary artery disease/hypertension - No anginal symptoms - Continue Imdur , Crestor  and metoprolol  - As needed hydralazine  also ordered.  7-history of seizure - Continue Keppra   8-hypothyroidism - Continue Synthroid   9-GERD - Patient allergic to PPI - Continue Pepcid .   Subjective:  Somnolent and intermittently oriented x 1 only.  Demonstrating some difficulties following commands per nursing staff.  Overnight expressing some itching and is not requiring the use of Atarax .  Physical Exam: Vitals:   01/03/24 1430 01/03/24 1500 01/03/24 1530 01/03/24 1545  BP: (!) 179/80 (!) 182/81 (!) 190/75 (!) 173/83  Pulse:    68  Resp: 18 16 15 20   Temp:    97.6 F (36.4 C)  TempSrc:    Axillary  SpO2:      Weight:    49.4 kg  Height:       General exam: Oriented x 1, somnolent and at times finding difficulty following commands. Respiratory system: Good saturation room air. Cardiovascular system: Rate controlled, no rubs, no gallops, no JVD. Gastrointestinal system:  Abdomen is nondistended, soft and nontender. No organomegaly or masses felt. Normal bowel sounds heard. Central nervous system: Moving 4 limbs spontaneously.  No focal neurological deficits. Extremities: No cyanosis or clubbing. Skin: No petechiae. Psychiatry: Judgement and insight appear impaired secondary to underlying  dementia.  Data Reviewed: CBC: WBCs 5.7, ?? hemoglobin 13.3, platelets 162K Renal function panel:Sodium 133, potassium 3.8, chloride 99, bicarb 22, BUN 40, creatinine 4.02 and GFR 11  Family Communication: Husband updated over the phone (01/02/2024).  Disposition: Status is: Observation The patient remains OBS appropriate and will d/c before 2 midnights.  Anticipating discharge back home once medically stable.  Time spent: 50 minutes  Author: Eric Nunnery, MD 01/03/2024 4:03 PM  For on call review [email or web address].

## 2024-01-03 NOTE — Progress Notes (Signed)
 Mobility Specialist Progress Note:    01/03/24 1013  Mobility  Activity Stood at bedside;Transferred to/from [facility]  Level of Assistance Maximum assist, patient does 25-49%  Assistive Device None;BSC  Distance Ambulated (ft) 5 ft  Range of Motion/Exercises Active;All extremities  Activity Response Tolerated well  Mobility Referral Yes  Mobility visit 1 Mobility  Mobility Specialist Start Time (ACUTE ONLY) 0945  Mobility Specialist Stop Time (ACUTE ONLY) 1013  Mobility Specialist Time Calculation (min) (ACUTE ONLY) 28 min   Pt received with nursing staff. Required MaxA to stand and transfer with no AD. Returned supine and left with nursing staff, all needs met.   Sherrilee Ditty Mobility Specialist Please contact via Special educational needs teacher or  Rehab office at [phone]

## 2024-01-03 NOTE — Progress Notes (Signed)
  HEMODIALYSIS TREATMENT NOTE:  Pre-HD:  Lethargic, responds to voice.  Does not keep eye contact, speaking gibberish or nonsense (Let me see... let me see).  Telephone consent for HD was obtained from pt's husband.  194/72, HR 60s / NSR, spO2 100% on RA.  Goal was challenged for HTN per d/w Dr. Jerrye.  3.5 hour session completed.  1.5 liters removed. All blood was returned.  Persistent HTN was d/w Dr. Jerrye. Pt is sleeping post-HD, responds to loud voice.  Hand-off given to Ronal Jenkins Rather, RN.   01/03/24 1545  Vitals  Temp 97.6 F (36.4 C)  Temp Source Axillary  BP (!) 173/83  MAP (mmHg) 107  BP Location Left Arm  BP Method Automatic  Patient Position (if appropriate) Lying  Pulse Rate 68  Pulse Rate Source Monitor  ECG Heart Rate 67  Resp 20  Weight 49.4 kg  Type of Weight Post-Dialysis  Post Treatment  Dialyzer Clearance Lightly streaked  Hemodialysis Intake (mL) 0 mL  Liters Processed 79.5  Fluid Removed (mL) 1500 mL  Tolerated HD Treatment Yes  Post-Hemodialysis Comments Goal challenge met    Jon Laos, RN AP [facility]

## 2024-01-03 NOTE — Consult Note (Signed)
 Gastroenterology Consult   Referring Provider: No ref. provider found Primary Care Physician:  [facility] Primary Gastroenterologist:  Dr. Eartha   Patient ID: Shawna [name]; [id number]; 07/09/1941   Admit date: 01/01/2024  LOS: 0 days   Date of Consultation: 01/03/2024  Reason for Consultation:  acute on chronic anemia, known history of AVMs   History of Present Illness   Shawna [name] is a 83 y.o. year old female with complex medical history including recurrent GI bleeding secondary to small bowel and gastric AVMs, heart failure, CAD/CABG PAF (not anticoagulated), end-stage renal disease on hemodialysis who was brought to the ED after becoming somnolent during dialysis, CBG found to be 60. Was also told her hgb at dialysis was 6.9. Patient denied any other complaints. Hgb 6.5 on arrival, GI consulted for further evaluation.  ED Course: Hgb 6.5 on arrival s/p 2 units PRBCs up to 13.3  Consult:  Unfortunately patient was altered, agitated upon my encounter. Multiple staff members in the room to try and redirect patient. She was unable to provide any history. I reached out to her significant other Shawna Garramone [facility]) who states that Monday after dialysis she became lethargic and CBG was lower so she was given something to eat and then he called 911 to take her to the ED. He states that she has been feeling a little weaker recently but no rectal bleeding or melena. He does endorse she has had diarrhea for about 1 week, but has fecal urgency after eating at baseline. He states she has c/o abdominal pain at times but cannot provide much information about this. He reports appetite is not great, she does snack a lot. He reports she is getting octreotide  injection at PCP office, thinks last dose was about 1 week ago, though does note she has gotten off schedule due to being in and out of the hospital, unclear if she has missed any injections. He is amenable to her undergoing  endoscopic evaluation this admission for further evaluation.   Enteroscopy: 11/2022: - Esophagogastric landmarks identified.                           - 2 cm hiatal hernia.                           - A few non-bleeding angiodysplastic lesions in the                            stomach. Treated with argon plasma coagulation                            (APC).                           - Normal stomach otherwise                           - Normal examined duodenum.                           - A tattoo was seen in the jejunum with a small  polyp.                           - Jejunal diverticulum - several, one large.                           - Six non-bleeding angiodysplastic lesions in the                            proximal jejunum beyond the tattoo. Treated with                            argon plasma coagulation (APC).                           AVMs are the likely cause of bleeding / anemia for                            this patient with ESRD. Ablated. NO active bleeding  Colonoscopy: 08/2022 - Preparation of the colon was fair.                           - Patent end-to-end ileo-colonic and end to side                            colocolonic anastomoses, characterized by healthy                            appearing mucosa.                           - The examined portion of the neo-terminal ileum                            was normal.                           - Two 5 to 6 mm polyps in the transverse colon,                            removed with a cold snare. Resected and retrieved.                           - Three 4 to 7 mm polyps in the descending colon,                            removed with a cold snare. Resected and retrieved.                           - One 9 mm polyp in the sigmoid colon, removed with                            a cold snare. Resected and retrieved.                           -  Moderate diverticulosis in the sigmoid colon and                             in the descending colon.  Past Medical History:  Diagnosis Date   Acute on chronic respiratory failure with hypoxia (HCC) 10/10/2016   Anxiety    Arthritis    AVM (arteriovenous malformation) of colon    CAD (coronary artery disease)    a. s/p CABG in 2013 b. DES to D1 in 10/2016. c. cath in 07/2018 showing patent grafts with occlusion of D1 at prior stent site and progression of PDA disease --> medical management recommended   Carotid artery disease (HCC)    a. 60-79% LICA, 03/2012    Chronic bronchitis (HCC)    Chronic HFrEF (heart failure with reduced ejection fraction) (HCC)    Colon cancer (HCC) 1992   Esophageal stricture    ESRD on hemodialysis (HCC)    ESRD due to HTN, started dialysis 2011 and gets HD at [facility] with Dr Edwardo on MWF schedule.  Access is LUA AVF as of Sept 2014.    GERD (gastroesophageal reflux disease)    High cholesterol 12/2011   History of blood transfusion 07/2011; 12/2011; 01/2012 X 2; 04/2012   History of gout    History of lower GI bleeding    Hypertension    Iron  deficiency anemia    Jugular vein occlusion, right (HCC)    Mitral regurgitation    a. Moderate by echo, 02/2012   Mitral valve disease    NSVT (nonsustained ventricular tachycardia) (HCC)    Ovarian cancer (HCC) 1992   PAF (paroxysmal atrial fibrillation) (HCC)    Pneumonia ~ 2009   PUD (peptic ulcer disease)    TIA (transient ischemic attack)    Tricuspid valve disease    Prior to Admission medications   Medication Sig Start Date End Date Taking? Authorizing Provider  albuterol  (PROVENTIL ) (2.5 MG/3ML) 0.083% nebulizer solution USE 1 VIAL IN NEBULIZER 4 TIMES DAILY AS NEEDED FOR COUGH, WHEEZING, AND SHORTNESS OF BREATH 10/20/23  Yes [provider]  albuterol  (VENTOLIN  HFA) 108 (90 Base) MCG/ACT inhaler SMARTSIG:2 Puff(s) By Mouth Every 4-6 Hours PRN 10/10/23  Yes [provider]  benzonatate  (TESSALON ) 100 MG capsule Take 1 capsule (100 mg total) by  mouth 2 (two) times daily as needed for cough. 11/05/23  Yes Johnson, Clanford L, MD  calcitRIOL  (ROCALTROL ) 0.25 MCG capsule Take 7 capsules (1.75 mcg total) by mouth every Monday, Wednesday, and Friday with hemodialysis. 12/21/22  Yes Christobal Guadalajara, MD  cinacalcet  (SENSIPAR ) 30 MG tablet Take 1 tablet (30 mg total) by mouth every Monday, Wednesday, and Friday with hemodialysis. 12/19/22  Yes Christobal Guadalajara, MD  cyanocobalamin  (VITAMIN B12) 1000 MCG/ML injection Inject 1,000 mcg into the muscle every 30 (thirty) days.   Yes [provider]  famotidine  (PEPCID ) 20 MG tablet Take 1 tablet (20 mg total) by mouth daily as needed for heartburn or indigestion. 09/10/23 09/09/24 Yes Johnson, Clanford L, MD  folic acid  (FOLVITE ) 1 MG tablet Take 1 mg by mouth daily. 05/24/22  Yes [provider]  guaifenesin  (ROBITUSSIN) 100 MG/5ML syrup Take 200 mg by mouth 3 (three) times daily as needed for cough.   Yes [provider]  hydrOXYzine  (ATARAX ) 25 MG tablet Take 25 mg by mouth every 6 (six) hours as needed for itching. 06/06/22  Yes [provider]  isosorbide  mononitrate (IMDUR ) 30 MG  24 hr tablet Take 1 tablet (30 mg total) by mouth daily. 11/15/23  Yes Cindy Garnette POUR, MD  levETIRAcetam  (KEPPRA ) 500 MG tablet Take 1 tablet (500 mg total) by mouth daily. 11/15/22  Yes Dennise Lavada POUR, MD  levothyroxine  (SYNTHROID ) 50 MCG tablet Take 50 mcg by mouth daily. 08/27/23  Yes [provider]  metoprolol  succinate (TOPROL  XL) 25 MG 24 hr tablet Take 1 tablet (25 mg total) by mouth 2 (two) times daily. Take an extra 25 mg for heart rate over 100 11/30/23  Yes Furth, Cadence H, PA-C  multivitamin (RENA-VIT) TABS tablet Take 1 tablet by mouth daily.   Yes [provider]  nitroGLYCERIN  (NITROSTAT ) 0.4 MG SL tablet Place 0.4 mg under the tongue every 5 (five) minutes as needed for chest pain.   Yes [provider]  octreotide  (SANDOSTATIN  LAR) 20 MG injection Inject 20 mg  into the muscle every 28 (twenty-eight) days. 11/15/22  Yes Dennise Lavada POUR, MD  ondansetron  (ZOFRAN -ODT) 4 MG disintegrating tablet Take 4 mg by mouth every 8 (eight) hours as needed for nausea or vomiting. 05/22/23  Yes [provider]  rosuvastatin  (CRESTOR ) 10 MG tablet Take 1 tablet by mouth once daily 06/06/22  Yes Strader, Brittany M, PA-C    Current Facility-Administered Medications  Medication Dose Route Frequency Provider Last Rate Last Admin   0.9 %  sodium chloride  infusion (Manually program via Guardrails IV Fluids)   Intravenous Once Ricky Fines, MD   Held at 01/01/24 2128   acetaminophen  (TYLENOL ) tablet 650 mg  650 mg Oral Q6H PRN Ricky Fines, MD   650 mg at 01/02/24 2110   Or   acetaminophen  (TYLENOL ) suppository 650 mg  650 mg Rectal Q6H PRN Ricky Fines, MD       albuterol  (PROVENTIL ) (2.5 MG/3ML) 0.083% nebulizer solution 2.5 mg  2.5 mg Nebulization Q6H PRN Ricky Fines, MD       Chlorhexidine  Gluconate Cloth 2 % PADS 6 each  6 each Topical Daily Ricky Fines, MD   6 each at 01/02/24 9166   Chlorhexidine  Gluconate Cloth 2 % PADS 6 each  6 each Topical Q0600 Melia Lynwood ORN, MD   6 each at 01/03/24 0533   famotidine  (PEPCID ) IVPB 20 mg premix  20 mg Intravenous q1800 Madera, Carlos, MD 100 mL/hr at 01/02/24 1756 20 mg at 01/02/24 1756   guaiFENesin -dextromethorphan  (ROBITUSSIN DM) 100-10 MG/5ML syrup 5 mL  5 mL Oral Q4H PRN Ricky Fines, MD       hydrALAZINE  (APRESOLINE ) tablet 50 mg  50 mg Oral Q6H PRN Ricky Fines, MD   50 mg at 01/02/24 1314   hydrOXYzine  (ATARAX ) tablet 25 mg  25 mg Oral Q6H PRN Ricky Fines, MD   25 mg at 01/02/24 2110   insulin  aspart (novoLOG ) injection 0-5 Units  0-5 Units Subcutaneous QHS Ricky Fines, MD       insulin  aspart (novoLOG ) injection 0-6 Units  0-6 Units Subcutaneous TID WC Ricky Fines, MD       isosorbide  mononitrate (IMDUR ) 24 hr tablet 30 mg  30 mg Oral Daily Ricky Fines, MD   30 mg at 01/02/24 0830    levETIRAcetam  (KEPPRA ) tablet 500 mg  500 mg Oral Daily Ricky Fines, MD   500 mg at 01/02/24 0830   levothyroxine  (SYNTHROID ) tablet 50 mcg  50 mcg Oral Q0600 Ricky Fines, MD   50 mcg at 01/02/24 0617   metoprolol  succinate (TOPROL -XL) 24 hr tablet 25 mg  25 mg Oral  BID Ricky Fines, MD   25 mg at 01/02/24 2111   rosuvastatin  (CRESTOR ) tablet 10 mg  10 mg Oral Daily Ricky Fines, MD   10 mg at 01/02/24 0830   sodium chloride  flush (NS) 0.9 % injection 3 mL  3 mL Intravenous Q12H Ricky Fines, MD   3 mL at 01/02/24 2112    Allergies as of 01/01/2024 - Review Complete 01/01/2024  Allergen Reaction Noted   Amlodipine  Swelling 01/19/2015   Aspirin  Other (See Comments) 12/15/2011   Nitrofurantoin Hives 07/08/2009   Ranexa  [ranolazine ] Other (See Comments) 08/30/2020   Bactrim [sulfamethoxazole-trimethoprim] Rash 12/15/2011   Gabapentin  Other (See Comments) 02/08/2017   Iodinated contrast media Itching and Other (See Comments) 12/15/2011   Iron  Itching and Other (See Comments) 05/02/2012   Sucroferric oxyhydroxide Other (See Comments) 04/15/2021   Tylenol  [acetaminophen ] Itching and Other (See Comments) 07/08/2016   Levaquin  [levofloxacin  in d5w] Rash 10/04/2013   Levofloxacin  Rash 04/15/2017   Plavix  [clopidogrel  bisulfate] Rash 01/05/2012   Protonix  [pantoprazole  sodium] Rash 03/21/2013   Venofer [ferric oxide] Itching and Other (See Comments) 05/02/2012    Review of Systems   As above, unable to obtain history from patient due to altered mental status, agitation.   Physical Exam   Vital Signs in last 24 hours: Temp:  [97.4 F (36.3 C)-98.7 F (37.1 C)] 97.4 F (36.3 C) (06/25 0427) Pulse Rate:  [53-107] 58 (06/25 0427) Resp:  [14-24] 20 (06/25 0427) BP: (150-217)/(70-93) 190/84 (06/25 0427) SpO2:  [97 %-100 %] 100 % (06/25 0427)   General:   Alert,  Well-developed, well-nourished Head:  Normocephalic and atraumatic. Eyes:  Sclera clear, no icterus.   Conjunctiva  pink. Ears:  Normal auditory acuity. Lungs:  Clear throughout to auscultation.   No wheezes, crackles, or rhonchi. No acute distress. Heart:  Regular rate and rhythm; no murmurs, clicks, rubs,  or gallops. Abdomen:  Soft, nontender and nondistended. No masses, hepatosplenomegaly or hernias noted. Normal bowel sounds, without guarding, and without rebound.   Neurologic:  Alert, confused  Skin:  Intact without significant lesions or rashes. Psych:  Alert, confused, agitated   Labs/Studies   Recent Labs Recent Labs    01/01/24 1914 01/02/24 0947 01/03/24 0423  WBC 8.6 8.9 5.7  HGB 6.5* 13.3 13.3  HCT 20.2* 39.1 39.6  PLT 194 147* 162   BMET Recent Labs    01/01/24 1914 01/02/24 0947 01/03/24 0423  NA 137 137 133*  K 2.5* 3.6 3.8  CL 98 109 99  CO2 25 18* 22  GLUCOSE 148* 97 83  BUN 20 23 40*  CREATININE 2.49* 2.40* 4.02*  CALCIUM  8.3* 6.7* 8.5*   LFT Recent Labs    01/01/24 1914 01/03/24 0423  PROT 7.0  --   ALBUMIN  2.9* 3.1*  AST 46*  --   ALT 35  --   ALKPHOS 228*  --   BILITOT 1.3*  --     Assessment   Lashell CATIE CHIAO is a 83 y.o. year old female  with complex medical history including recurrent GI bleeding secondary to small bowel and gastric AVMs, heart failure, CAD/CABG PAF (not anticoagulated), end-stage renal disease on hemodialysis who was brought to the ED after becoming somnolent during dialysis, CBG found to be 60. Was also told her hgb at dialysis was 6.9. Patient denied any other complaints. Hgb 6.5 on arrival, GI consulted for further evaluation.  Acute on chronic anemia/heme positive stool: -no rectal bleeding/melena per family -maintained on octreotide  SQ as  outpatient -last Enteroscopy in may 2024 with  A few non-bleeding angiodysplastic lesions in the  stomach. Treated with (APC), Six non-bleeding angiodysplastic lesions in the proximal jejunum beyond the tattoo. Treated with (APC), AVMs are the likely cause of bleeding / anemia. Ablated. NO  active bleeding.   -continues to have frequent drops in hgb every few weeks, requiring transfusion -husband denies any melena or BRBPR -questionable if she has missed or gotten off schedule with her octreotide  injections per his report  At this time, recommend attempting push enteroscopy tomorrow if patient is stable as there may be AVMs that can be ablated with APC. Will continue with current outpatient dose of octreotide    Indications, risks and benefits of procedure discussed in detail with patient's. Patient's Husband (DPR) Lelia Jons verbalized understanding and is in agreement to proceed with push enteroscopy tomorrow.    Plan / Recommendations   PPI BID Renal diet today, NPO midnight Will plan for push enteroscopy tomorrow Monitor for overt GI bleeding Trend h&h Continue current dose of octreotide  as outpatient     01/03/2024, 8:52 AM  Flannery Cavallero L. Linder Prajapati, MSN, APRN, AGNP-C Adult-Gerontology Nurse Practitioner [facility]enterology at [facility]

## 2024-01-03 NOTE — Consult Note (Signed)
 [facility] Renal Consultation Note  Requesting MD: Eric Nunnery, MD Indication for Consultation:  ESRD  Chief complaint: low blood sugar and AMS  HPI:  Shawna [name] is a 83 y.o. female with a history of end-stage renal disease, chronic heart failure with reduced ejection fraction, coronary artery disease, hx of GI bleeds, history of TIA and paroxysmal atrial fibrillation who presented to the hospital with low blood sugar.  She had gone to dialysis on 6/23 and completed a treatment per charting but was noted to be somnolent at dialysis following her HD session.  She was mumbling and not herself.  Per charting, her blood sugar was in the low 60s which persisted despite PO intake.  She was sent to the ER for evaluation.  Nephrology is consulted for assistance with management of ESRD.  She was noted to have hemoglobin 6.5 and received two units PRBC's.  She is followed by GI and receives long-acting octreotide  injections to  help optimize.  My colleague ordered HD for today.  I called her outpatient HD unit for the orders below.       PMHx:   Past Medical History:  Diagnosis Date   Acute on chronic respiratory failure with hypoxia (HCC) 10/10/2016   Anxiety    Arthritis    AVM (arteriovenous malformation) of colon    CAD (coronary artery disease)    a. s/p CABG in 2013 b. DES to D1 in 10/2016. c. cath in 07/2018 showing patent grafts with occlusion of D1 at prior stent site and progression of PDA disease --> medical management recommended   Carotid artery disease (HCC)    a. 60-79% LICA, 03/2012    Chronic bronchitis (HCC)    Chronic HFrEF (heart failure with reduced ejection fraction) (HCC)    Colon cancer (HCC) 1992   Esophageal stricture    ESRD on hemodialysis (HCC)    ESRD due to HTN, started dialysis 2011 and gets HD at [facility] with Dr Edwardo on MWF schedule.  Access is LUA AVF as of Sept 2014.    GERD (gastroesophageal reflux disease)    High cholesterol  12/2011   History of blood transfusion 07/2011; 12/2011; 01/2012 X 2; 04/2012   History of gout    History of lower GI bleeding    Hypertension    Iron  deficiency anemia    Jugular vein occlusion, right (HCC)    Mitral regurgitation    a. Moderate by echo, 02/2012   Mitral valve disease    NSVT (nonsustained ventricular tachycardia) (HCC)    Ovarian cancer (HCC) 1992   PAF (paroxysmal atrial fibrillation) (HCC)    Pneumonia ~ 2009   PUD (peptic ulcer disease)    TIA (transient ischemic attack)    Tricuspid valve disease     Past Surgical History:  Procedure Laterality Date   A/V FISTULAGRAM Left 10/04/2022   Procedure: A/V Fistulagram;  Surgeon: Jama Cordella MATSU, MD;  Location: [facility] INVASIVE CV LAB;  Service: Cardiovascular;  Laterality: Left;   A/V SHUNTOGRAM Left 03/19/2019   Procedure: A/V SHUNTOGRAM;  Surgeon: Jama Cordella MATSU, MD;  Location: [facility] INVASIVE CV LAB;  Service: Cardiovascular;  Laterality: Left;   ABDOMINAL HYSTERECTOMY  1992   APPENDECTOMY  06/1990   AV FISTULA PLACEMENT  07/2009   left upper arm   AV FISTULA PLACEMENT Right 09/06/2016   Procedure: RIGHT FOREARM ARTERIOVENOUS (AV) GRAFT;  Surgeon: Carlin FORBES Haddock, MD;  Location: [facility] OR;  Service: Vascular;  Laterality: Right;  AV FISTULA PLACEMENT N/A 02/24/2017   Procedure: INSERTION OF ARTERIOVENOUS (AV) GORE-TEX GRAFT ARM (BRACHIAL AXILLARY);  Surgeon: Jama Cordella MATSU, MD;  Location: [facility] ORS;  Service: Vascular;  Laterality: N/A;   AVGG REMOVAL Right 09/06/2016   Procedure: REMOVAL OF Right Arm ARTERIOVENOUS GORETEX GRAFT and Vein Patch angioplasty of brachial artery;  Surgeon: Lonni GORMAN Blade, MD;  Location: [facility] OR;  Service: Vascular;  Laterality: Right;   BIOPSY  09/26/2019   Procedure: BIOPSY;  Surgeon: Golda Claudis PENNER, MD;  [facility];  Service: Endoscopy;;   BIOPSY  11/01/2022   Procedure: BIOPSY;  Surgeon: Eartha Angelia Sieving, MD;  [facility];  Service:  Gastroenterology;;   COLON RESECTION  1992   COLON SURGERY     COLONOSCOPY N/A 03/09/2019   Procedure: COLONOSCOPY;  Surgeon: Golda Claudis PENNER, MD;  [facility];  Service: Endoscopy;  Laterality: N/A;   COLONOSCOPY N/A 08/11/2022   Procedure: COLONOSCOPY;  Surgeon: Albertus Gordy HERO, MD;  Location: [facility] ENDOSCOPY;  Service: Gastroenterology;  Laterality: N/A;   COLONOSCOPY WITH PROPOFOL  N/A 06/08/2021   Procedure: COLONOSCOPY WITH PROPOFOL ;  Surgeon: Eartha Angelia Sieving, MD;  [facility];  Service: Gastroenterology;  Laterality: N/A;  9:05 /Patient is on dialysis Mon Wed Fri   CORONARY ANGIOPLASTY WITH STENT PLACEMENT  12/15/11   2   CORONARY ANGIOPLASTY WITH STENT PLACEMENT  y/2013   1; makes total of 3 (05/02/2012)   CORONARY ARTERY BYPASS GRAFT  06/13/2012   Procedure: CORONARY ARTERY BYPASS GRAFTING (CABG);  Surgeon: Dallas KATHEE Jude, MD;  Location: [facility] OR;  Service: Open Heart Surgery;  Laterality: N/A;  cabg x four;  using left internal mammary artery, and left leg greater saphenous vein harvested endoscopically   CORONARY STENT INTERVENTION N/A 10/13/2016   Procedure: Coronary Stent Intervention;  Surgeon: Debby DELENA Sor, MD;  [facility] CV LAB;  Service: Cardiovascular;  Laterality: N/A;   DIALYSIS/PERMA CATHETER REMOVAL N/A 04/18/2017   Procedure: DIALYSIS/PERMA CATHETER REMOVAL;  Surgeon: Jama Cordella MATSU, MD;  Location: [facility] INVASIVE CV LAB;  Service: Cardiovascular;  Laterality: N/A;   DILATION AND CURETTAGE OF UTERUS     ENTEROSCOPY N/A 06/08/2021   Procedure: PUSH ENTEROSCOPY;  Surgeon: Eartha Angelia Sieving, MD;  [facility];  Service: Gastroenterology;  Laterality: N/A;   ENTEROSCOPY N/A 11/01/2022   Procedure: ENTEROSCOPY;  Surgeon: Eartha Angelia Sieving, MD;  [facility];  Service: Gastroenterology;  Laterality: N/A;   ENTEROSCOPY N/A 12/01/2022   Procedure: ENTEROSCOPY;  Surgeon: Leigh Elspeth SQUIBB, MD;   Location: [facility] ENDOSCOPY;  Service: Gastroenterology;  Laterality: N/A;   ESOPHAGOGASTRODUODENOSCOPY  01/20/2012   Procedure: ESOPHAGOGASTRODUODENOSCOPY (EGD);  Surgeon: Gwendlyn ONEIDA Buddy, MD,FACG;  Location: [facility] ENDOSCOPY;  Service: Endoscopy;  Laterality: N/A;   ESOPHAGOGASTRODUODENOSCOPY N/A 03/26/2013   Procedure: ESOPHAGOGASTRODUODENOSCOPY (EGD);  Surgeon: Norleen LOISE Kiang, MD;  Location: [facility] ENDOSCOPY;  Service: Endoscopy;  Laterality: N/A;   ESOPHAGOGASTRODUODENOSCOPY N/A 04/30/2015   Procedure: ESOPHAGOGASTRODUODENOSCOPY (EGD);  Surgeon: Claudis PENNER Golda, MD;  [facility];  Service: Endoscopy;  Laterality: N/A;  1pm - moved to 10/20 @ 1:10   ESOPHAGOGASTRODUODENOSCOPY N/A 07/29/2016   Procedure: ESOPHAGOGASTRODUODENOSCOPY (EGD);  Surgeon: Elspeth Deward Leigh, MD;  Location: [facility] ENDOSCOPY;  Service: Gastroenterology;  Laterality: N/A;  enteroscopy   ESOPHAGOGASTRODUODENOSCOPY N/A 09/26/2019   Procedure: ESOPHAGOGASTRODUODENOSCOPY (EGD);  Surgeon: Golda Claudis PENNER, MD;  [facility];  Service: Endoscopy;  Laterality: N/A;  1250   ESOPHAGOGASTRODUODENOSCOPY N/A  08/11/2022   Procedure: ESOPHAGOGASTRODUODENOSCOPY (EGD);  Surgeon: Albertus Gordy HERO, MD;  Location: [facility] ENDOSCOPY;  Service: Gastroenterology;  Laterality: N/A;   ESOPHAGOGASTRODUODENOSCOPY (EGD) WITH PROPOFOL  N/A 02/05/2021   Procedure: ESOPHAGOGASTRODUODENOSCOPY (EGD) WITH PROPOFOL ;  Surgeon: Cindie Carlin POUR, DO;  [facility];  Service: Endoscopy;  Laterality: N/A;   ESOPHAGOGASTRODUODENOSCOPY (EGD) WITH PROPOFOL  N/A 08/27/2022   Procedure: ESOPHAGOGASTRODUODENOSCOPY (EGD) WITH PROPOFOL ;  Surgeon: Cindie Carlin POUR, DO;  [facility];  Service: Endoscopy;  Laterality: N/A;   GIVENS CAPSULE STUDY N/A 03/07/2019   Procedure: GIVENS CAPSULE STUDY;  Surgeon: Golda Claudis PENNER, MD;  [facility];  Service: Endoscopy;  Laterality: N/A;  7:30   GIVENS CAPSULE STUDY N/A 04/22/2021   Procedure: GIVENS  CAPSULE STUDY;  Surgeon: Golda Claudis PENNER, MD;  [facility];  Service: Endoscopy;  Laterality: N/A;  7:30   GIVENS CAPSULE STUDY N/A 08/27/2022   Procedure: GIVENS CAPSULE STUDY;  Surgeon: Cindie Carlin POUR, DO;  [facility];  Service: Endoscopy;  Laterality: N/A;   HOT HEMOSTASIS N/A 08/11/2022   Procedure: HOT HEMOSTASIS (ARGON PLASMA COAGULATION/BICAP);  Surgeon: Albertus Gordy HERO, MD;  Location: [facility] ENDOSCOPY;  Service: Gastroenterology;  Laterality: N/A;   HOT HEMOSTASIS N/A 12/01/2022   Procedure: HOT HEMOSTASIS (ARGON PLASMA COAGULATION/BICAP);  Surgeon: Leigh Elspeth SQUIBB, MD;  Location: [facility] ENDOSCOPY;  Service: Gastroenterology;  Laterality: N/A;   INSERTION OF DIALYSIS CATHETER N/A 10/05/2020   Procedure: ABORTED TUNNELED DIALYSIS CATHETER PLACEMENT RIGHT INTERNAL JUGULAR VEIN ;  Surgeon: Kallie Manuelita BROCKS, MD;  Location: [facility];  Service: General;  Laterality: N/A;   INTRAOPERATIVE TRANSESOPHAGEAL ECHOCARDIOGRAM  06/13/2012   Procedure: INTRAOPERATIVE TRANSESOPHAGEAL ECHOCARDIOGRAM;  Surgeon: Dallas KATHEE Jude, MD;  Location: [facility] OR;  Service: Open Heart Surgery;  Laterality: N/A;   IR DIALY SHUNT INTRO NEEDLE/INTRACATH INITIAL W/IMG LEFT Left 10/06/2020   IR FLUORO GUIDE CV LINE RIGHT  06/17/2020   IR FLUORO GUIDE CV LINE RIGHT  11/12/2022   IR GENERIC HISTORICAL  07/26/2016   IR FLUORO GUIDE CV LINE RIGHT 07/26/2016 Ozell Specking, MD [facility]   IR GENERIC HISTORICAL  07/26/2016   IR US  GUIDE VASC ACCESS RIGHT 07/26/2016 Ozell Specking, MD [facility]   IR GENERIC HISTORICAL  08/02/2016   IR US  GUIDE VASC ACCESS RIGHT 08/02/2016 Ozell Specking, MD [facility]   IR GENERIC HISTORICAL  08/02/2016   IR FLUORO GUIDE CV LINE RIGHT 08/02/2016 Ozell Specking, MD [facility]   IR RADIOLOGY PERIPHERAL GUIDED IV START  03/28/2017   IR REMOVAL TUN CV CATH W/O FL  08/11/2020   IR REMOVAL TUN CV CATH W/O FL  11/15/2022   IR THROMBECTOMY AV FISTULA W/THROMBOLYSIS INC/SHUNT/IMG LEFT Left  06/17/2020   IR US  GUIDE VASC ACCESS LEFT  06/17/2020   IR US  GUIDE VASC ACCESS RIGHT  03/28/2017   IR US  GUIDE VASC ACCESS RIGHT  06/17/2020   IR US  GUIDE VASC ACCESS RIGHT  11/12/2022   LEFT HEART CATH AND CORONARY ANGIOGRAPHY N/A 09/20/2016   Procedure: Left Heart Cath and Coronary Angiography;  Surgeon: Victory LELON Sharps, MD;  Location: [facility] INVASIVE CV LAB;  Service: Cardiovascular;  Laterality: N/A;   LEFT HEART CATH AND CORS/GRAFTS ANGIOGRAPHY N/A 10/13/2016   Procedure: Left Heart Cath and Cors/Grafts Angiography;  Surgeon: Debby DELENA Sor, MD;  [facility];  Service: Cardiovascular;  Laterality: N/A;   LEFT HEART CATH AND CORS/GRAFTS ANGIOGRAPHY N/A 07/13/2018   Procedure: LEFT HEART CATH AND  CORS/GRAFTS ANGIOGRAPHY;  Surgeon: Swaziland, Peter M, MD;  Location: [facility] INVASIVE CV LAB;  Service: Cardiovascular;  Laterality: N/A;   LEFT HEART CATH AND CORS/GRAFTS ANGIOGRAPHY N/A 07/22/2021   Procedure: LEFT HEART CATH AND CORS/GRAFTS ANGIOGRAPHY;  Surgeon: Court Dorn PARAS, MD;  [facility] INV[facility] CV LAB;  Service: Cardiovascular;  Laterality: N/A;   LEFT HEART CATHETERIZATION WITH CORONARY ANGIOGRAM N/A 12/15/2011   Procedure: LEFT HEART CATHETERIZATION WITH CORONARY ANGIOGRAM;  Surgeon: Lonni JONETTA Cash, MD;  Location: [facility] CATH LAB;  Service: Cardiovascular;  Laterality: N/A;   LEFT HEART CATHETERIZATION WITH CORONARY ANGIOGRAM N/A 01/10/2012   Procedure: LEFT HEART CATHETERIZATION WITH CORONARY ANGIOGRAM;  Surgeon: Peter M Swaziland, MD;  Location: [facility] CATH LAB;  Service: Cardiovascular;  Laterality: N/A;   LEFT HEART CATHETERIZATION WITH CORONARY ANGIOGRAM N/A 06/08/2012   Procedure: LEFT HEART CATHETERIZATION WITH CORONARY ANGIOGRAM;  Surgeon: Lonni JONETTA Cash, MD;  Location: [facility] CATH LAB;  Service: Cardiovascular;  Laterality: N/A;   LEFT HEART CATHETERIZATION WITH CORONARY/GRAFT ANGIOGRAM N/A 12/10/2013   Procedure: LEFT HEART CATHETERIZATION WITH EL BILE;  Surgeon:  Candyce GORMAN Reek, MD;  Location: [facility] CATH LAB;  Service: Cardiovascular;  Laterality: N/A;   OVARY SURGERY     ovarian cancer   POLYPECTOMY  03/09/2019   Procedure: POLYPECTOMY;  Surgeon: Golda Claudis PENNER, MD;  [facility];  Service: Endoscopy;;  cecal    POLYPECTOMY N/A 09/26/2019   Procedure: DUODENAL POLYPECTOMY;  Surgeon: Golda Claudis PENNER, MD;  [facility];  Service: Endoscopy;  Laterality: N/A;   POLYPECTOMY  08/11/2022   Procedure: POLYPECTOMY;  Surgeon: Albertus Gordy HERO, MD;  Location: [facility] ENDOSCOPY;  Service: Gastroenterology;;   REVISION OF ARTERIOVENOUS GORETEX GRAFT N/A 02/24/2017   Procedure: REVISION OF ARTERIOVENOUS GORETEX GRAFT (RESECTION);  Surgeon: Jama Cordella MATSU, MD;  Location: [facility] ORS;  Service: Vascular;  Laterality: N/A;   REVISON OF ARTERIOVENOUS FISTULA Left 06/19/2020   Procedure: REVISION OF LEFT UPPER ARM AV GRAFT WITH INTERPOSITION JUMP GRAFT USING GORE LIMB;  Surgeon: Gretta Lonni PARAS, MD;  Location: [facility] OR;  Service: Vascular;  Laterality: Left;   SHUNTOGRAM N/A 10/15/2013   Procedure: Fistulogram;  Surgeon: Gaile LELON New, MD;  Location: [facility] CATH LAB;  Service: Cardiovascular;  Laterality: N/A;   SUBMUCOSAL TATTOO INJECTION  11/01/2022   Procedure: SUBMUCOSAL TATTOO INJECTION;  Surgeon: Eartha Angelia Sieving, MD;  [facility];  Service: Gastroenterology;;   THROMBECTOMY / ARTERIOVENOUS GRAFT REVISION  2011   left upper arm   TUBAL LIGATION  1980's   UPPER EXTREMITY ANGIOGRAPHY Bilateral 12/06/2016   Procedure: Upper Extremity Angiography;  Surgeon: Jama Cordella MATSU, MD;  Location: [facility] INVASIVE CV LAB;  Service: Cardiovascular;  Laterality: Bilateral;   UPPER EXTREMITY INTERVENTION Left 06/06/2017   Procedure: UPPER EXTREMITY INTERVENTION;  Surgeon: Jama Cordella MATSU, MD;  Location: [facility] INVASIVE CV LAB;  Service: Cardiovascular;  Laterality: Left;   UPPER EXTREMITY VENOGRAPHY Left 10/04/2022   Procedure: UPPER  EXTREMITY VENOGRAPHY;  Surgeon: Jama Cordella MATSU, MD;  Location: [facility] INVASIVE CV LAB;  Service: Cardiovascular;  Laterality: Left;    Family Hx:  Family History  Problem Relation Age of Onset   Heart disease Mother        Heart Disease before age 39   Hyperlipidemia Mother    Hypertension Mother    Diabetes Mother    Heart attack Mother    Heart disease Father        Heart Disease before age 75  Hyperlipidemia Father    Hypertension Father    Diabetes Father    Diabetes Sister    Hypertension Sister    Diabetes Brother    Hyperlipidemia Brother    Heart attack Brother    Hypertension Sister    Heart attack Brother    Colon cancer Child 75   Other Other        noncontributory for early CAD   Esophageal cancer Neg Hx    Liver disease Neg Hx    Kidney disease Neg Hx    Colon polyps Neg Hx     Social History:  reports that she has never smoked. She has never used smokeless tobacco. She reports that she does not drink alcohol  and does not use drugs.  Allergies:  Allergies  Allergen Reactions   Amlodipine  Swelling   Aspirin  Other (See Comments)    High Doses Mess up her stomach; makes my bowels have blood in them. Takes 81 mg EC Aspirin     Nitrofurantoin Hives   Ranexa  [Ranolazine ] Other (See Comments)    Myoclonus-hospitalized    Bactrim [Sulfamethoxazole-Trimethoprim] Rash   Gabapentin  Other (See Comments)    Unknown reaction   Iodinated Contrast Media Itching and Other (See Comments)   Iron  Itching and Other (See Comments)    they gave me iron  in dialysis; had to give me Benadryl  cause I had to have the iron  (05/02/2012)   Sucroferric Oxyhydroxide Other (See Comments)    Unknown   Tylenol  [Acetaminophen ] Itching and Other (See Comments)    Makes her feet on fire per pt - can tolerate per pt   Levaquin  [Levofloxacin  In D5w] Rash   Levofloxacin  Rash   Plavix  [Clopidogrel  Bisulfate] Rash   Protonix  [Pantoprazole  Sodium] Rash   Venofer [Ferric Oxide]  Itching and Other (See Comments)    Patient reports using Benadryl  prior to doses as [facility]    Medications: Prior to Admission medications   Medication Sig Start Date End Date Taking? Authorizing Provider  albuterol  (PROVENTIL ) (2.5 MG/3ML) 0.083% nebulizer solution USE 1 VIAL IN NEBULIZER 4 TIMES DAILY AS NEEDED FOR COUGH, WHEEZING, AND SHORTNESS OF BREATH 10/20/23  Yes [provider]  albuterol  (VENTOLIN  HFA) 108 (90 Base) MCG/ACT inhaler SMARTSIG:2 Puff(s) By Mouth Every 4-6 Hours PRN 10/10/23  Yes [provider]  benzonatate  (TESSALON ) 100 MG capsule Take 1 capsule (100 mg total) by mouth 2 (two) times daily as needed for cough. 11/05/23  Yes Johnson, Clanford L, MD  calcitRIOL  (ROCALTROL ) 0.25 MCG capsule Take 7 capsules (1.75 mcg total) by mouth every Monday, Wednesday, and Friday with hemodialysis. 12/21/22  Yes Christobal Guadalajara, MD  cinacalcet  (SENSIPAR ) 30 MG tablet Take 1 tablet (30 mg total) by mouth every Monday, Wednesday, and Friday with hemodialysis. 12/19/22  Yes Christobal Guadalajara, MD  cyanocobalamin  (VITAMIN B12) 1000 MCG/ML injection Inject 1,000 mcg into the muscle every 30 (thirty) days.   Yes [provider]  famotidine  (PEPCID ) 20 MG tablet Take 1 tablet (20 mg total) by mouth daily as needed for heartburn or indigestion. 09/10/23 09/09/24 Yes Johnson, Clanford L, MD  folic acid  (FOLVITE ) 1 MG tablet Take 1 mg by mouth daily. 05/24/22  Yes [provider]  guaifenesin  (ROBITUSSIN) 100 MG/5ML syrup Take 200 mg by mouth 3 (three) times daily as needed for cough.   Yes [provider]  hydrOXYzine  (ATARAX ) 25 MG tablet Take 25 mg by mouth every 6 (six) hours as needed for itching. 06/06/22  Yes [provider]  isosorbide  mononitrate (IMDUR ) 30 MG 24 hr tablet Take 1 tablet (30 mg total) by mouth daily. 11/15/23  Yes Cindy Garnette POUR, MD  levETIRAcetam  (KEPPRA ) 500 MG tablet Take 1 tablet (500 mg total) by mouth daily. 11/15/22  Yes Singh,  Prashant K, MD  levothyroxine  (SYNTHROID ) 50 MCG tablet Take 50 mcg by mouth daily. 08/27/23  Yes [provider]  metoprolol  succinate (TOPROL  XL) 25 MG 24 hr tablet Take 1 tablet (25 mg total) by mouth 2 (two) times daily. Take an extra 25 mg for heart rate over 100 11/30/23  Yes Furth, Cadence H, PA-C  multivitamin (RENA-VIT) TABS tablet Take 1 tablet by mouth daily.   Yes [provider]  nitroGLYCERIN  (NITROSTAT ) 0.4 MG SL tablet Place 0.4 mg under the tongue every 5 (five) minutes as needed for chest pain.   Yes [provider]  octreotide  (SANDOSTATIN  LAR) 20 MG injection Inject 20 mg into the muscle every 28 (twenty-eight) days. 11/15/22  Yes Dennise Lavada POUR, MD  ondansetron  (ZOFRAN -ODT) 4 MG disintegrating tablet Take 4 mg by mouth every 8 (eight) hours as needed for nausea or vomiting. 05/22/23  Yes [provider]  rosuvastatin  (CRESTOR ) 10 MG tablet Take 1 tablet by mouth once daily 06/06/22  Yes Strader, Laymon HERO, PA-C   I have reviewed the patient's current and reported prior to admission medications.  Labs:     Latest Ref Rng & Units 01/03/2024    4:23 AM 01/02/2024    9:47 AM 01/01/2024    7:14 PM  BMP  Glucose 70 - 99 mg/dL 83  97  851   BUN 8 - 23 mg/dL 40  23  20   Creatinine 0.44 - 1.00 mg/dL 5.97  7.59  7.50   Sodium 135 - 145 mmol/L 133  137  137   Potassium 3.5 - 5.1 mmol/L 3.8  3.6  2.5   Chloride 98 - 111 mmol/L 99  109  98   CO2 22 - 32 mmol/L 22  18  25    Calcium  8.9 - 10.3 mg/dL 8.5  6.7  8.3      ROS:  Unable to obtain secondary to AMS   Physical Exam: Vitals:   01/03/24 0018 01/03/24 0427  BP: (!) 150/93 (!) 190/84  Pulse: (!) 107 (!) 58  Resp: 14 20  Temp: 98.7 F (37.1 C) (!) 97.4 F (36.3 C)  SpO2: 99% 100%     General: elderly female in chair in NAD   HEENT: NCAT Eyes: sclera anicteric  Neck: supple trachea midline  Heart: S1S2 tachy  Lungs: clear an unlabored  Abdomen: soft/nt/nd Extremities:no  pitting edema no cyanosis or clubbing Skin: no rash on extremities exposed Psych: some agitation with questions and exams Neuro: she is not able to provide her name. Just mumbles in response to questions - essentially nonverbal  Access: Left internal jugular tunneled catheter in place    Outpatient HD orders:  [facility]  MWF  4 hours  BF 300 DF 500 ml/min EDW 50.5 kg  Bath: 2K/2.5 ca Meds: mircera 200 mcg given on 6/23, calcitriol  2 mcg PO with HD and cincalcet 30 mg with HD  Last post weight:  48.6 kg on 6/23 and left at 49 kg on 6/20 - states that she would be having her weight dropped    Assessment/Plan:  # End-stage renal disease - Hemodialysis today per her routine Monday Wednesday Friday schedule - I called HD RN to notify her of AMS  and orders for today  # Anemia of acute blood loss - Compounded by anemia of chronic disease - Note history of GI bleeds - Management per primary team.   - She is known to GI and is on long-acting octreotide  injections - note she received mircera on 6/23 outpatient  # HTN  - optimize UF with HD  - note that EDW will need to be lowered.  She has consistently been at 49 kg outpatient x 2 treatments so could try that.    # Heart failure with reduced ejection fraction - Optimize volume status with HD  # Metabolic bone disease - Noted outpatient Sensipar  and activated vitamin D  with HD.  In her case both of these are actually given at the HD unit.  Would not include on discharge summary so as to prevent duplicate dosing  - will continue inpatient  # Encephalopathy  - On presentation and she is still altered today - I reached out to her team with her exam with me this AM - she is just mumbling gibberish   Thank you for the consult.  Please do not hesitate to contact me with any questions regarding our patient   Shawna [name] 01/03/2024, 9:53 AM

## 2024-01-04 ENCOUNTER — Encounter (HOSPITAL_COMMUNITY)
Admission: EM | Disposition: A | Discharge status: Home / Self Care | Payer: Self-pay | Source: Home / Self Care | Attending: Family Medicine

## 2024-01-04 ENCOUNTER — Observation Stay (HOSPITAL_COMMUNITY): Admitting: Anesthesiology

## 2024-01-04 ENCOUNTER — Encounter (HOSPITAL_COMMUNITY): Payer: Self-pay | Admitting: Family Medicine

## 2024-01-04 DIAGNOSIS — K2970 Gastritis, unspecified, without bleeding: Secondary | ICD-10-CM | POA: Diagnosis not present

## 2024-01-04 DIAGNOSIS — K31819 Angiodysplasia of stomach and duodenum without bleeding: Secondary | ICD-10-CM | POA: Diagnosis not present

## 2024-01-04 DIAGNOSIS — K5710 Diverticulosis of small intestine without perforation or abscess without bleeding: Secondary | ICD-10-CM

## 2024-01-04 DIAGNOSIS — I2510 Atherosclerotic heart disease of native coronary artery without angina pectoris: Secondary | ICD-10-CM

## 2024-01-04 DIAGNOSIS — N186 End stage renal disease: Secondary | ICD-10-CM | POA: Diagnosis not present

## 2024-01-04 DIAGNOSIS — I480 Paroxysmal atrial fibrillation: Secondary | ICD-10-CM | POA: Diagnosis not present

## 2024-01-04 DIAGNOSIS — E876 Hypokalemia: Secondary | ICD-10-CM | POA: Diagnosis not present

## 2024-01-04 DIAGNOSIS — I110 Hypertensive heart disease with heart failure: Secondary | ICD-10-CM

## 2024-01-04 DIAGNOSIS — I5042 Chronic combined systolic (congestive) and diastolic (congestive) heart failure: Secondary | ICD-10-CM | POA: Diagnosis not present

## 2024-01-04 DIAGNOSIS — D649 Anemia, unspecified: Secondary | ICD-10-CM | POA: Diagnosis not present

## 2024-01-04 HISTORY — PX: ENTEROSCOPY: SHX5533

## 2024-01-04 LAB — GLUCOSE, CAPILLARY
Glucose-Capillary: 135 mg/dL — ABNORMAL HIGH (ref 70–99)
Glucose-Capillary: 140 mg/dL — ABNORMAL HIGH (ref 70–99)
Glucose-Capillary: 166 mg/dL — ABNORMAL HIGH (ref 70–99)
Glucose-Capillary: 68 mg/dL — ABNORMAL LOW (ref 70–99)
Glucose-Capillary: 74 mg/dL (ref 70–99)
Glucose-Capillary: 85 mg/dL (ref 70–99)
Glucose-Capillary: 88 mg/dL (ref 70–99)

## 2024-01-04 LAB — COMPREHENSIVE METABOLIC PANEL WITH GFR
ALT: 24 U/L (ref 0–44)
AST: 32 U/L (ref 15–41)
Albumin: 2.7 g/dL — ABNORMAL LOW (ref 3.5–5.0)
Alkaline Phosphatase: 219 U/L — ABNORMAL HIGH (ref 38–126)
Anion gap: 12 (ref 5–15)
BUN: 27 mg/dL — ABNORMAL HIGH (ref 8–23)
CO2: 25 mmol/L (ref 22–32)
Calcium: 8.5 mg/dL — ABNORMAL LOW (ref 8.9–10.3)
Chloride: 99 mmol/L (ref 98–111)
Creatinine, Ser: 3.44 mg/dL — ABNORMAL HIGH (ref 0.44–1.00)
GFR, Estimated: 13 mL/min — ABNORMAL LOW (ref >60)
Glucose, Bld: 82 mg/dL (ref 70–99)
Potassium: 3.8 mmol/L (ref 3.5–5.1)
Sodium: 136 mmol/L (ref 135–145)
Total Bilirubin: 2.7 mg/dL — ABNORMAL HIGH (ref 0.0–1.2)
Total Protein: 6.3 g/dL — ABNORMAL LOW (ref 6.5–8.1)

## 2024-01-04 LAB — CBC
HCT: 38.3 % (ref 36.0–46.0)
Hemoglobin: 12.6 g/dL (ref 12.0–15.0)
MCH: 30.7 pg (ref 26.0–34.0)
MCHC: 32.9 g/dL (ref 30.0–36.0)
MCV: 93.2 fL (ref 80.0–100.0)
Platelets: 182 10*3/uL (ref 150–400)
RBC: 4.11 MIL/uL (ref 3.87–5.11)
RDW: 24.2 % — ABNORMAL HIGH (ref 11.5–15.5)
WBC: 6.7 10*3/uL (ref 4.0–10.5)
nRBC: 0 % (ref 0.0–0.2)

## 2024-01-04 LAB — HEPATITIS B SURFACE ANTIBODY, QUANTITATIVE: Hep B S AB Quant (Post): 343 m[IU]/mL

## 2024-01-04 SURGERY — ENTEROSCOPY
Anesthesia: General

## 2024-01-04 MED ORDER — SODIUM CHLORIDE 0.9 % IV SOLN: INTRAVENOUS | Status: DC | PRN

## 2024-01-04 MED ORDER — DEXTROSE 50 % IV SOLN
50.0000 mL | Freq: Once | INTRAVENOUS | Status: AC
  Administered 2024-01-04: 50 mL via INTRAVENOUS

## 2024-01-04 MED ORDER — PHENYLEPHRINE 80 MCG/ML (10ML) SYRINGE FOR IV PUSH (FOR BLOOD PRESSURE SUPPORT)
PREFILLED_SYRINGE | INTRAVENOUS | Status: AC
  Filled 2024-01-04: qty 10

## 2024-01-04 MED ORDER — PROPOFOL 10 MG/ML IV BOLUS
INTRAVENOUS | Status: DC | PRN
  Administered 2024-01-04 (×2): 30 mg via INTRAVENOUS

## 2024-01-04 MED ORDER — LIDOCAINE 2% (20 MG/ML) 5 ML SYRINGE
INTRAMUSCULAR | Status: AC
  Filled 2024-01-04: qty 5

## 2024-01-04 MED ORDER — PHENYLEPHRINE 80 MCG/ML (10ML) SYRINGE FOR IV PUSH (FOR BLOOD PRESSURE SUPPORT)
PREFILLED_SYRINGE | INTRAVENOUS | Status: DC | PRN
Start: 2024-01-04 — End: 2024-01-04
  Administered 2024-01-04: 120 ug via INTRAVENOUS
  Administered 2024-01-04 (×2): 80 ug via INTRAVENOUS
  Administered 2024-01-04: 120 ug via INTRAVENOUS
  Administered 2024-01-04: 160 ug via INTRAVENOUS
  Administered 2024-01-04: 80 ug via INTRAVENOUS
  Administered 2024-01-04 (×2): 120 ug via INTRAVENOUS
  Administered 2024-01-04: 160 ug via INTRAVENOUS

## 2024-01-04 MED ORDER — DEXTROSE 50 % IV SOLN
INTRAVENOUS | Status: AC
  Filled 2024-01-04: qty 50

## 2024-01-04 MED ORDER — LIDOCAINE 2% (20 MG/ML) 5 ML SYRINGE
INTRAMUSCULAR | Status: DC | PRN
Start: 2024-01-04 — End: 2024-01-04
  Administered 2024-01-04: 80 mg via INTRAVENOUS

## 2024-01-04 MED ORDER — PROPOFOL 500 MG/50ML IV EMUL
INTRAVENOUS | Status: DC | PRN
  Administered 2024-01-04: 150 ug/kg/min via INTRAVENOUS

## 2024-01-04 MED ORDER — ONDANSETRON HCL 4 MG/2ML IJ SOLN: 4.0000 mg | Freq: Four times a day (QID) | INTRAMUSCULAR | Status: DC | PRN

## 2024-01-04 NOTE — Progress Notes (Signed)
 Progress Note   Patient: Shawna Shawna DOB: [birth year] DOA: 01/01/2024     0 DOS: the patient was seen and examined on 01/04/2024   Brief hospital admission narrative: As per H&P written by Dr. Charlton on 01/01/24 Shawna Shawna is an 83 y.o. female with medical history significant for AVMs with recurrent GI bleeding, chronic HFpEF, CAD status post CABG, PAF not anticoagulated, colon cancer status postresection, type 2 diabetes mellitus, and ESRD on hemodialysis who presents with low blood glucose.   Patient was reportedly in her usual state of health today at home, ate breakfast, and went to dialysis where she reportedly completed her session.  She was noted to be somnolent following the treatment and CBG was in the low 60s.  She was given food and drink but repeat CBG was still in the low 60s and she was sent to the ED for evaluation.     Patient has no complaints herself.  Patient has been evaluated by GI numerous times in the hospital and receives long-acting octreotide  injections and blood transfusions.   ED Course: Upon arrival to the ED, patient is found to be afebrile and saturating well on room air with stable BP.  Labs are most notable for potassium 2.5, albumin  2.9, and hemoglobin 6.5.   Patient was treated with IV potassium and had 2 units RBC ordered for transfusion from the ED.  Assessment and plan 1-symptomatic acute on chronic blood loss anemia -Hemoglobin at time of admission 6.5 - 2 units PRBC provided - Hemodynamically stable otherwise - Prior history of AVMs; over a year without endoscopic evaluation.  Receiving octreotide  as an outpatient. - Appreciate assistance and recommendation by GI; planning for endoscopy evaluation later today 01/04/2024 - Continue to follow hemoglobin trend - Continue Pepcid ; patient allergic to PPI. - Patient with complaining of chronic anemia from renal failure - IV iron  and Epogen  as per nephrology discretion.  2-ESRD - Monday,  Wednesday and Friday (regular schedule). - Tolerated dialysis treatment well on 01/03/2024. - Continue renally dosing medications and follow renal diet - Appreciate assistance and recommendation by nephrology service; planning for dialysis again tomorrow.  3-type 2 diabetes - Mild hypoglycemia appreciated at time of admission - Continue to follow CBGs fluctuation - Sliding scale insulin  will be provided.  4-hypokalemia - Repleted - Continue to follow trend.  5-paroxysmal atrial fibrillation - Continue Toprol  - Not a candidate for anticoagulation therapy with ongoing history of GI bleed.  6-coronary artery disease/hypertension - No anginal symptoms - Continue Imdur , Crestor  and metoprolol  - As needed hydralazine  also ordered.  7-history of seizure - Continue Keppra   8-hypothyroidism - Continue Synthroid   9-GERD - Patient allergic to PPI - Continue Pepcid .   Subjective:  Oriented x 1, following commands and more interactive.  No overt bleeding appreciated overnight.  Hemodynamically stable.  Currently n.p.o. with anticipation for endoscopic evaluation later today.  Physical Exam: Vitals:   01/04/24 1500 01/04/24 1515 01/04/24 1530 01/04/24 1600  BP: (!) 123/54 129/61 131/68 130/77  Pulse: (!) 58 (!) 58 60   Resp: (!) 23 (!) 22 (!) 21 (!) 28  Temp: 97.7 F (36.5 C)   (!) 97.4 F (36.3 C)  TempSrc:      SpO2: 97% 98% 99% 98%  Weight:      Height:       General exam: Alert, awake, oriented x 1 and following commands appropriately; no upper bleeding appreciated. Respiratory system: Good saturation on room air. Cardiovascular system: Rate controlled, no  rubs, no gallops, no JVD on exam. Gastrointestinal system: Abdomen is nondistended, soft and nontender.  Positive bowel sounds. Central nervous system: Moving 4 limbs spontaneously.  No focal neurological deficits. Extremities: No cyanosis or clubbing. Skin: No petechiae. Psychiatry: Judgement and insight appear  impaired secondary to underlying dementia.  Data Reviewed: CBC: WBCs 6.7, hemoglobin 12.6 and platelet count 182K  Family Communication: Husband updated over the phone (01/02/2024).  Disposition: Status is: Observation The patient remains OBS appropriate and will d/c before 2 midnights.  Anticipating discharge back home once medically stable.  Time spent: 50 minutes  Author: Eric Nunnery, MD 01/04/2024 4:41 PM  For on call review [email or web address].

## 2024-01-04 NOTE — Plan of Care (Signed)
   Problem: Education: Goal: Knowledge of General Education information will improve Description: Including pain rating scale, medication(s)/side effects and non-pharmacologic comfort measures Outcome: Progressing   Problem: Health Behavior/Discharge Planning: Goal: Ability to manage health-related needs will improve Outcome: Progressing   Problem: Clinical Measurements: Goal: Ability to maintain clinical measurements within normal limits will improve Outcome: Progressing Goal: Will remain free from infection Outcome: Progressing Goal: Diagnostic test results will improve Outcome: Progressing Goal: Respiratory complications will improve Outcome: Progressing Goal: Cardiovascular complication will be avoided Outcome: Progressing

## 2024-01-04 NOTE — Progress Notes (Signed)
 Arterio-venous graft left upper arm - thrill is palpable pre-operatively

## 2024-01-04 NOTE — Op Note (Signed)
 [facility] Patient Name: Shawna [name] Procedure Date: 01/04/2024 2:06 PM MRN: [id number] Date of Birth: 1940/12/24 Attending MD: Deatrice Dine , MD, [id number] CSN: [id number] Age: 83 Admit Type: Inpatient Procedure:                Small bowel enteroscopy Indications:              Acute post hemorrhagic anemia, Obscure                            gastrointestinal bleeding Providers:                Deatrice Dine, MD, Jon LABOR. Museum/gallery exhibitions officer, Charity fundraiser,                            Mickel CROME. Shirlean Balm, Technician Referring MD:              Medicines:                Monitored Anesthesia Care Complications:            No immediate complications. Estimated Blood Loss:     Estimated blood loss: none. Procedure:                Pre-Anesthesia Assessment:                           - Prior to the procedure, a History and Physical                            was performed, and patient medications and                            allergies were reviewed. The patient's tolerance of                            previous anesthesia was also reviewed. The risks                            and benefits of the procedure and the sedation                            options and risks were discussed with the patient.                            All questions were answered, and informed consent                            was obtained. Prior Anticoagulants: The patient has                            taken no anticoagulant or antiplatelet agents                            except for aspirin . ASA Grade Assessment: III - A  patient with severe systemic disease. After                            reviewing the risks and benefits, the patient was                            deemed in satisfactory condition to undergo the                            procedure.                           After obtaining informed consent, the endoscope was                            passed under direct vision. Throughout  the                            procedure, the patient's blood pressure, pulse, and                            oxygen  saturations were monitored continuously. The                            [id number]) scope was introduced through                            the mouth and advanced to the proximal jejunum. The                            small bowel enteroscopy was accomplished without                            difficulty. The patient tolerated the procedure                            well. Scope In: 2:28:54 PM Scope Out: 2:54:04 PM Total Procedure Duration: 0 hours 25 minutes 10 seconds  Findings:      The examined esophagus was normal.      Moderate inflammation characterized by erythema was found in the entire       examined stomach.      There is no endoscopic evidence of bleeding in the entire examined       stomach.      Six angioectasias with no bleeding were found in the duodenal bulb and       in the third portion of the duodenum. Coagulation for hemostasis using       argon plasma at 0.3 liters/minute and 20 watts was successful.      A diverticulum was found in the entire examined portion of jejunum. Impression:               - Normal esophagus.                           - Gastritis.                           -  Six non-bleeding angioectasias in the duodenum.                            Treated with argon plasma coagulation (APC).                           - Non-bleeding jejunal diverticulum.                           - No specimens collected. Moderate Sedation:      Per Anesthesia Care Recommendation:           Advance diet as tolerate                           Continue octreotide  montly                           Check iron  profile as outpatient                           tranfuse to keep HBG>7                           coninue with PPI daily                           Spoke with patient husband over the phone Procedure Code(s):        --- Professional ---                            [phone], Small intestinal endoscopy, enteroscopy                            beyond second portion of duodenum, not including                            ileum; with control of bleeding (eg, injection,                            bipolar cautery, unipolar cautery, laser, heater                            probe, stapler, plasma coagulator) Diagnosis Code(s):        --- Professional ---                           K29.70, Gastritis, unspecified, without bleeding                           K31.819, Angiodysplasia of stomach and duodenum                            without bleeding                           D62, Acute posthemorrhagic anemia  K92.2, Gastrointestinal hemorrhage, unspecified                           K57.10, Diverticulosis of small intestine without                            perforation or abscess without bleeding CPT copyright 2022 [employer]. All rights reserved. The codes documented in this report are preliminary and upon coder review may  be revised to meet current compliance requirements. Deatrice Dine, MD Deatrice Dine, MD 01/04/2024 3:12:08 PM This report has been signed electronically. Number of Addenda: 0

## 2024-01-04 NOTE — Brief Op Note (Signed)
 Patient underwent Enteroscopy under propofol  sedation.  Tolerated the procedure adequately.   FINDINGS:  - Normal esophagus.  - Gastritis.   - Six non- bleeding angioectasias in the duodenum. Treated with argon plasma coagulation ( APC) .   - Non- bleeding jejunal diverticulum.   - No specimens collected.  RECOMMENDATIONS  -Advance diet as tolerate  -Continue octreotide  montly  -Check iron  profile as outpatient  -Transfuse to keep HBG> 7  -continue with PPI daily  -Spoke with patient husband over the phone  Emmabelle Fear Faizan Ginevra Tacker, MD Gastroenterology and Hepatology [facility] Gastroenterology

## 2024-01-04 NOTE — Interval H&P Note (Signed)
 History and Physical Interval Note:  01/04/2024 2:05 PM  Shawna [name]  has presented today for surgery, with the diagnosis of acute on chronic anemia, history of small bowel AVMs.  The various methods of treatment have been discussed with the patient and family. After consideration of risks, benefits and other options for treatment, the patient has consented to  Procedure(s): ENTEROSCOPY (N/A) as a surgical intervention.  The patient's history has been reviewed, patient examined, no change in status, stable for surgery.  I have reviewed the patient's chart and labs.  Questions were answered to the patient husband satisfaction.     I spoke to patient husband Mr Leonor over the phone explaining him the procedure ( patient has baseline dementia)   We will proceed with EGD with enteroscopy with control of bleeding   I thoroughly discussed with the patient husband the procedure, including the risks involved. patient husbandunderstands what the procedure involves including the benefits and any risks.patient husband understands alternatives to the proposed procedure. Risks including (but not limited to) bleeding, tearing of the lining (perforation), rupture of adjacent organs, problems with heart and lung function, infection, and medication reactions. A small percentage of complications may require surgery, hospitalization, repeat endoscopic procedure, and/or transfusion.  patient husband understood and agreed.    [name] Abubakr Wieman

## 2024-01-04 NOTE — Transfer of Care (Signed)
 Immediate Anesthesia Transfer of Care Note  Patient: Shawna [name]  Procedure(s) Performed: ENTEROSCOPY  Patient Location: PACU  Anesthesia Type:MAC  Level of Consciousness: drowsy  Airway & Oxygen  Therapy: Patient Spontanous Breathing  Post-op Assessment: Report given to RN and Post -op Vital signs reviewed and stable  Post vital signs: Reviewed and stable  Last Vitals:  Vitals Value Taken Time  BP 123/54 01/04/24 15:01  Temp    Pulse 57 01/04/24 15:01  Resp 22 01/04/24 15:01  SpO2 98% on RA 01/04/24 15:01    Last Pain:  Vitals:   01/04/24 0958  TempSrc: Oral  PainSc: 0-No pain      Patients Stated Pain Goal: 0 (01/02/24 0109)  Complications: No notable events documented.

## 2024-01-04 NOTE — Progress Notes (Signed)
 Shawna [name] is an 83 y.o. female with ESRD, HFrEF, CASHD, hx of GI bleeds, history of TIA and pAfib who presented to the hospital with low blood sugar.  She had gone to dialysis on 6/23 and completed a treatment per charting but was noted to be somnolent at dialysis following her HD session.  She was mumbling and not herself.  Per charting, her blood sugar was in the low 60s which persisted despite PO intake.  In ED, hemoglobin 6.5 and received two units PRBC's.  She is followed by GI and receives long-acting octreotide  injections to  help optimize.    Outpatient HD orders:  [facility]  MWF  4 hours  BF 300 DF 500 ml/min EDW 50.5 kg  Bath: 2K/2.5 ca Meds: mircera 200 mcg given on 6/23, calcitriol  2 mcg PO with HD and cincalcet 30 mg with HD  Last post weight:  48.6 kg on 6/23 and left at 49 kg on 6/20  Assessment/Plan: # End-stage renal disease - Hemodialysis today per her routine Monday Wednesday Friday schedule - Tolerated dialysis on Wednesday with 1.5 L net UF; unable to UF too much because of significant anemia.   # Anemia of acute blood loss - Compounded by anemia of chronic disease - Note history of GI bleeds; Hb 12.6 this AM - Management per primary team.   - She is known to GI and is on long-acting octreotide  injections - note she received mircera on 6/23 outpatient   # HTN  - optimize UF with HD  - note that EDW will need to be lowered.  She has consistently been at 49 kg outpatient x 2 treatments so will try for that on Fri.     # Heart failure with reduced ejection fraction - Optimize volume status with HD   # Metabolic bone disease - Noted outpatient Sensipar  and activated vitamin D  with HD.  In her case both of these are actually given at the HD unit.  Would not include on discharge summary so as to prevent duplicate dosing  - will continue inpatient   # Encephalopathy  - On presentation and she is still altered today; easily arousable and opens eyes but really not  following commands.  Mainly moaning and  mumbling   Subjective: Confused, lethargic, not really answering questions this morning but arousable and opens her eyes.   Chemistry and CBC: Creat  Date/Time Value Ref Range Status  03/05/2019 11:59 AM 6.57 (H) 0.60 - 0.93 mg/dL Final    Comment:    For patients >12 years of age, the reference limit for Creatinine is approximately 13% higher for people identified as African-American. .    Creatinine, Ser  Date/Time Value Ref Range Status  01/03/2024 04:23 AM 4.02 (H) 0.44 - 1.00 mg/dL Final    Comment:    DELTA CHECK NOTED  01/02/2024 09:47 AM 2.40 (H) 0.44 - 1.00 mg/dL Final  93/76/7974 92:85 PM 2.49 (H) 0.44 - 1.00 mg/dL Final  93/88/7974 98:43 PM 6.40 (H) 0.44 - 1.00 mg/dL Final  94/77/7974 91:80 PM 3.71 (H) 0.44 - 1.00 mg/dL Final  94/93/7974 94:79 AM 4.31 (H) 0.44 - 1.00 mg/dL Final  94/94/7974 93:96 AM 6.38 (H) 0.44 - 1.00 mg/dL Final  94/95/7974 95:92 AM 5.10 (H) 0.44 - 1.00 mg/dL Final  94/96/7974 96:61 PM 4.42 (H) 0.44 - 1.00 mg/dL Final  94/97/7974 90:91 AM 4.48 (H) 0.44 - 1.00 mg/dL Final  94/98/7974 95:89 AM 3.08 (H) 0.44 - 1.00 mg/dL Final  95/69/7974  04:59 AM 4.76 (H) 0.44 - 1.00 mg/dL Final  95/70/7974 95:61 AM 3.27 (H) 0.44 - 1.00 mg/dL Final  95/71/7974 95:71 AM 4.24 (H) 0.44 - 1.00 mg/dL Final  95/73/7974 95:41 AM 4.71 (H) 0.44 - 1.00 mg/dL Final  95/74/7974 98:94 PM 4.29 (H) 0.44 - 1.00 mg/dL Final  96/68/7974 98:53 PM 6.08 (H) 0.44 - 1.00 mg/dL Final  96/71/7974 88:84 AM 4.86 (H) 0.44 - 1.00 mg/dL Final  96/73/7974 88:84 AM 4.56 (H) 0.44 - 1.00 mg/dL Final  96/74/7974 91:64 PM 3.95 (H) 0.44 - 1.00 mg/dL Final  96/87/7974 87:57 PM 4.09 (H) 0.44 - 1.00 mg/dL Final  96/91/7974 94:86 PM 2.76 (H) 0.44 - 1.00 mg/dL Final  97/71/7974 97:98 PM 4.60 (H) 0.44 - 1.00 mg/dL Final  97/71/7974 97:98 PM 4.59 (H) 0.44 - 1.00 mg/dL Final  97/87/7974 95:75 AM 4.05 (H) 0.44 - 1.00 mg/dL Final  97/89/7974 94:86 PM 4.37 (H) 0.44 -  1.00 mg/dL Final  97/90/7974 95:92 AM 3.78 (H) 0.44 - 1.00 mg/dL Final  97/91/7974 91:43 PM 3.36 (H) 0.44 - 1.00 mg/dL Final  98/71/7974 91:51 AM 4.52 (H) 0.44 - 1.00 mg/dL Final  98/72/7974 94:87 AM 6.53 (H) 0.44 - 1.00 mg/dL Final  98/73/7974 97:86 AM 4.90 (H) 0.44 - 1.00 mg/dL Final  98/74/7974 97:58 PM 4.33 (H) 0.44 - 1.00 mg/dL Final  87/68/7975 95:69 PM 6.65 (H) 0.44 - 1.00 mg/dL Final  87/69/7975 96:85 PM 5.84 (H) 0.44 - 1.00 mg/dL Final  88/74/7975 88:77 AM 4.74 (H) 0.44 - 1.00 mg/dL Final    Comment:    DELTA CHECK NOTED  06/04/2023 05:29 AM 3.07 (H) 0.44 - 1.00 mg/dL Final  88/76/7975 90:41 AM 4.70 (H) 0.44 - 1.00 mg/dL Final  88/78/7975 87:79 PM 5.15 (H) 0.44 - 1.00 mg/dL Final  88/78/7975 87:79 PM 5.12 (H) 0.44 - 1.00 mg/dL Final  88/79/7975 87:43 PM 4.41 (H) 0.44 - 1.00 mg/dL Final  88/97/7975 96:99 PM 5.43 (H) 0.44 - 1.00 mg/dL Final  89/69/7975 97:89 PM 5.46 (H) 0.44 - 1.00 mg/dL Final  89/77/7975 95:46 AM 5.88 (H) 0.44 - 1.00 mg/dL Final  89/78/7975 97:43 PM 5.42 (H) 0.44 - 1.00 mg/dL Final  89/86/7975 91:61 PM 4.61 (H) 0.44 - 1.00 mg/dL Final  89/88/7975 94:76 PM 4.58 (H) 0.44 - 1.00 mg/dL Final  90/69/7975 95:81 PM 5.40 (H) 0.44 - 1.00 mg/dL Final  90/72/7975 95:67 PM 5.29 (H) 0.44 - 1.00 mg/dL Final  91/69/7975 91:69 AM 4.07 (H) 0.44 - 1.00 mg/dL Final  91/70/7975 96:68 AM 6.31 (H) 0.44 - 1.00 mg/dL Final  91/71/7975 97:64 PM 6.03 (H) 0.44 - 1.00 mg/dL Final  91/97/7975 97:60 PM 4.08 (H) 0.44 - 1.00 mg/dL Final   Recent Labs  Lab 01/01/24 1914 01/02/24 0947 01/03/24 0423  NA 137 137 133*  K 2.5* 3.6 3.8  CL 98 109 99  CO2 25 18* 22  GLUCOSE 148* 97 83  BUN 20 23 40*  CREATININE 2.49* 2.40* 4.02*  CALCIUM  8.3* 6.7* 8.5*  PHOS  --   --  3.3   Recent Labs  Lab 01/01/24 1914 01/02/24 0947 01/03/24 0423 01/04/24 0411  WBC 8.6 8.9 5.7 6.7  NEUTROABS 6.9  --   --   --   HGB 6.5* 13.3 13.3 12.6  HCT 20.2* 39.1 39.6 38.3  MCV 101.0* 96.1 94.3 93.2   PLT 194 147* 162 182   Liver Function Tests: Recent Labs  Lab 01/01/24 1914 01/03/24 0423  AST 46*  --   ALT 35  --  ALKPHOS 228*  --   BILITOT 1.3*  --   PROT 7.0  --   ALBUMIN  2.9* 3.1*   No results for input(s): LIPASE, AMYLASE in the last 168 hours. No results for input(s): AMMONIA in the last 168 hours. Cardiac Enzymes: No results for input(s): CKTOTAL, CKMB, CKMBINDEX, TROPONINI in the last 168 hours. Iron  Studies: No results for input(s): IRON , TIBC, TRANSFERRIN, FERRITIN in the last 72 hours. PT/INR: @LABRCNTIP (inr:5)  Xrays/Other Studies: ) Results for orders placed or performed during the hospital encounter of 01/01/24 (from the past 48 hours)  Basic metabolic panel with GFR     Status: Abnormal   Collection Time: 01/02/24  9:47 AM  Result Value Ref Range   Sodium 137 135 - 145 mmol/L   Potassium 3.6 3.5 - 5.1 mmol/L   Chloride 109 98 - 111 mmol/L   CO2 18 (L) 22 - 32 mmol/L   Glucose, Bld 97 70 - 99 mg/dL    Comment: Glucose reference range applies only to samples taken after fasting for at least 8 hours.   BUN 23 8 - 23 mg/dL   Creatinine, Ser 7.59 (H) 0.44 - 1.00 mg/dL   Calcium  6.7 (L) 8.9 - 10.3 mg/dL   GFR, Estimated 20 (L) >60 mL/min    Comment: (NOTE) Calculated using the CKD-EPI Creatinine Equation (2021)    Anion gap 10 5 - 15    Comment: Performed at [facility], [address] [place], [place] [address]  CBC     Status: Abnormal   Collection Time: 01/02/24  9:47 AM  Result Value Ref Range   WBC 8.9 4.0 - 10.5 K/uL    Comment: WHITE COUNT CONFIRMED ON SMEAR   RBC 4.07 3.87 - 5.11 MIL/uL   Hemoglobin 13.3 12.0 - 15.0 g/dL    Comment: REPEATED TO VERIFY POST TRANSFUSION SPECIMEN DELTA CHECK NOTED    HCT 39.1 36.0 - 46.0 %   MCV 96.1 80.0 - 100.0 fL   MCH 32.7 26.0 - 34.0 pg   MCHC 34.0 30.0 - 36.0 g/dL   RDW 75.9 (H) 88.4 - 84.4 %   Platelets 147 (L) 150 - 400 K/uL    Comment: SPECIMEN CHECKED FOR CLOTS REPEATED  TO VERIFY PLATELET COUNT CONFIRMED BY SMEAR    nRBC 0.0 0.0 - 0.2 %    Comment: Performed at [facility], [address], [place], [place] [address]  Glucose, capillary     Status: None   Collection Time: 01/02/24 11:33 AM  Result Value Ref Range   Glucose-Capillary 81 70 - 99 mg/dL    Comment: Glucose reference range applies only to samples taken after fasting for at least 8 hours.  Glucose, capillary     Status: None   Collection Time: 01/02/24  4:22 PM  Result Value Ref Range   Glucose-Capillary 84 70 - 99 mg/dL    Comment: Glucose reference range applies only to samples taken after fasting for at least 8 hours.  Glucose, capillary     Status: Abnormal   Collection Time: 01/02/24  7:27 PM  Result Value Ref Range   Glucose-Capillary 157 (H) 70 - 99 mg/dL    Comment: Glucose reference range applies only to samples taken after fasting for at least 8 hours.  Glucose, capillary     Status: None   Collection Time: 01/03/24  4:21 AM  Result Value Ref Range   Glucose-Capillary 81 70 - 99 mg/dL    Comment: Glucose reference range applies only to  samples taken after fasting for at least 8 hours.  CBC     Status: Abnormal   Collection Time: 01/03/24  4:23 AM  Result Value Ref Range   WBC 5.7 4.0 - 10.5 K/uL   RBC 4.20 3.87 - 5.11 MIL/uL   Hemoglobin 13.3 12.0 - 15.0 g/dL   HCT 60.3 63.9 - 53.9 %   MCV 94.3 80.0 - 100.0 fL   MCH 31.7 26.0 - 34.0 pg   MCHC 33.6 30.0 - 36.0 g/dL   RDW 75.5 (H) 88.4 - 84.4 %   Platelets 162 150 - 400 K/uL    Comment: SPECIMEN CHECKED FOR CLOTS REPEATED TO VERIFY PLATELETS APPEAR ADEQUATE    nRBC 0.0 0.0 - 0.2 %    Comment: Performed at [facility], [address] [place], [place] [address]  Renal function panel     Status: Abnormal   Collection Time: 01/03/24  4:23 AM  Result Value Ref Range   Sodium 133 (L) 135 - 145 mmol/L   Potassium 3.8 3.5 - 5.1 mmol/L   Chloride 99 98 - 111 mmol/L   CO2 22 22 - 32 mmol/L   Glucose, Bld 83 70 - 99 mg/dL     Comment: Glucose reference range applies only to samples taken after fasting for at least 8 hours.   BUN 40 (H) 8 - 23 mg/dL   Creatinine, Ser 5.97 (H) 0.44 - 1.00 mg/dL    Comment: DELTA CHECK NOTED   Calcium  8.5 (L) 8.9 - 10.3 mg/dL   Phosphorus 3.3 2.5 - 4.6 mg/dL   Albumin  3.1 (L) 3.5 - 5.0 g/dL   GFR, Estimated 11 (L) >60 mL/min    Comment: (NOTE) Calculated using the CKD-EPI Creatinine Equation (2021)    Anion gap 12 5 - 15    Comment: Performed at [facility], [address] [place], [place] [address]  Hepatitis B surface antigen     Status: None   Collection Time: 01/03/24  4:23 AM  Result Value Ref Range   Hepatitis B Surface Ag NON REACTIVE NON REACTIVE    Comment: Performed at [facility], [address], [place], [place] [address]  Hepatitis B surface antibody,quantitative     Status: None   Collection Time: 01/03/24  4:23 AM  Result Value Ref Range   Hep B S AB Quant (Post) 343.0 Immunity>10 mIU/mL    Comment: (NOTE)  Status of Immunity                     Anti-HBs Level  ------------------                     -------------- Inconsistent with Immunity                  0.0 - 10.0 Consistent with Immunity                         >10.0 Performed At: [facility] [address] [place], [place] [id number] Jennette Shorter MD Ey:[phone]   Glucose, capillary     Status: None   Collection Time: 01/03/24  7:29 AM  Result Value Ref Range   Glucose-Capillary 89 70 - 99 mg/dL    Comment: Glucose reference range applies only to samples taken after fasting for at least 8 hours.   Comment 1 Document in Chart   Glucose, capillary     Status: None   Collection Time: 01/03/24  9:11 AM  Result Value Ref Range   Glucose-Capillary 81 70 - 99 mg/dL    Comment: Glucose reference range applies only to samples taken after fasting for at least 8 hours.   Comment 1 Document in Chart   Glucose, capillary     Status: None   Collection Time: 01/03/24 12:06 PM  Result  Value Ref Range   Glucose-Capillary 92 70 - 99 mg/dL    Comment: Glucose reference range applies only to samples taken after fasting for at least 8 hours.  Glucose, capillary     Status: None   Collection Time: 01/03/24  4:37 PM  Result Value Ref Range   Glucose-Capillary 78 70 - 99 mg/dL    Comment: Glucose reference range applies only to samples taken after fasting for at least 8 hours.   Comment 1 Notify RN    Comment 2 Document in Chart   Glucose, capillary     Status: Abnormal   Collection Time: 01/03/24  8:08 PM  Result Value Ref Range   Glucose-Capillary 69 (L) 70 - 99 mg/dL    Comment: Glucose reference range applies only to samples taken after fasting for at least 8 hours.  Glucose, capillary     Status: None   Collection Time: 01/04/24 12:13 AM  Result Value Ref Range   Glucose-Capillary 85 70 - 99 mg/dL    Comment: Glucose reference range applies only to samples taken after fasting for at least 8 hours.  CBC     Status: Abnormal   Collection Time: 01/04/24  4:11 AM  Result Value Ref Range   WBC 6.7 4.0 - 10.5 K/uL   RBC 4.11 3.87 - 5.11 MIL/uL   Hemoglobin 12.6 12.0 - 15.0 g/dL   HCT 61.6 63.9 - 53.9 %   MCV 93.2 80.0 - 100.0 fL   MCH 30.7 26.0 - 34.0 pg   MCHC 32.9 30.0 - 36.0 g/dL   RDW 75.7 (H) 88.4 - 84.4 %   Platelets 182 150 - 400 K/uL    Comment: REPEATED TO VERIFY   nRBC 0.0 0.0 - 0.2 %    Comment: Performed at [facility], [address], [place], [place] [address]  Glucose, capillary     Status: None   Collection Time: 01/04/24  7:13 AM  Result Value Ref Range   Glucose-Capillary 88 70 - 99 mg/dL    Comment: Glucose reference range applies only to samples taken after fasting for at least 8 hours.   Comment 1 Document in Chart    *Note: Due to a large number of results and/or encounters for the requested time period, some results have not been displayed. A complete set of results can be found in Results Review.   No results found.  PMH:   Past  Medical History:  Diagnosis Date   Acute on chronic respiratory failure with hypoxia (HCC) 10/10/2016   Anxiety    Arthritis    AVM (arteriovenous malformation) of colon    CAD (coronary artery disease)    a. s/p CABG in 2013 b. DES to D1 in 10/2016. c. cath in 07/2018 showing patent grafts with occlusion of D1 at prior stent site and progression of PDA disease --> medical management recommended   Carotid artery disease (HCC)    a. 60-79% LICA, 03/2012    Chronic bronchitis (HCC)    Chronic HFrEF (heart failure with reduced ejection fraction) (HCC)    Colon cancer (HCC) 1992   Esophageal stricture  ESRD on hemodialysis (HCC)    ESRD due to HTN, started dialysis 2011 and gets HD at [facility] with Dr Edwardo on MWF schedule.  Access is LUA AVF as of Sept 2014.    GERD (gastroesophageal reflux disease)    High cholesterol 12/2011   History of blood transfusion 07/2011; 12/2011; 01/2012 X 2; 04/2012   History of gout    History of lower GI bleeding    Hypertension    Iron  deficiency anemia    Jugular vein occlusion, right (HCC)    Mitral regurgitation    a. Moderate by echo, 02/2012   Mitral valve disease    NSVT (nonsustained ventricular tachycardia) (HCC)    Ovarian cancer (HCC) 1992   PAF (paroxysmal atrial fibrillation) (HCC)    Pneumonia ~ 2009   PUD (peptic ulcer disease)    TIA (transient ischemic attack)    Tricuspid valve disease     PSH:   Past Surgical History:  Procedure Laterality Date   A/V FISTULAGRAM Left 10/04/2022   Procedure: A/V Fistulagram;  Surgeon: Jama Cordella MATSU, MD;  Location: [facility] INVASIVE CV LAB;  Service: Cardiovascular;  Laterality: Left;   A/V SHUNTOGRAM Left 03/19/2019   Procedure: A/V SHUNTOGRAM;  Surgeon: Jama Cordella MATSU, MD;  Location: [facility] INVASIVE CV LAB;  Service: Cardiovascular;  Laterality: Left;   ABDOMINAL HYSTERECTOMY  1992   APPENDECTOMY  06/1990   AV FISTULA PLACEMENT  07/2009   left upper arm   AV FISTULA PLACEMENT Right  09/06/2016   Procedure: RIGHT FOREARM ARTERIOVENOUS (AV) GRAFT;  Surgeon: Carlin FORBES Haddock, MD;  Location: [facility] OR;  Service: Vascular;  Laterality: Right;   AV FISTULA PLACEMENT N/A 02/24/2017   Procedure: INSERTION OF ARTERIOVENOUS (AV) GORE-TEX GRAFT ARM (BRACHIAL AXILLARY);  Surgeon: Jama Cordella MATSU, MD;  Location: [facility] ORS;  Service: Vascular;  Laterality: N/A;   AVGG REMOVAL Right 09/06/2016   Procedure: REMOVAL OF Right Arm ARTERIOVENOUS GORETEX GRAFT and Vein Patch angioplasty of brachial artery;  Surgeon: Lonni GORMAN Blade, MD;  Location: [facility] OR;  Service: Vascular;  Laterality: Right;   BIOPSY  09/26/2019   Procedure: BIOPSY;  Surgeon: Golda Claudis PENNER, MD;  [facility];  Service: Endoscopy;;   BIOPSY  11/01/2022   Procedure: BIOPSY;  Surgeon: Eartha Angelia Sieving, MD;  [facility]: [facility];  Service: Gastroenterology;;   COLON RESECTION  1992   COLON SURGERY     COLONOSCOPY N/A 03/09/2019   Procedure: COLONOSCOPY;  Surgeon: Golda Claudis PENNER, MD;  [facility];  Service: Endoscopy;  Laterality: N/A;   COLONOSCOPY N/A 08/11/2022   Procedure: COLONOSCOPY;  Surgeon: Albertus Gordy HERO, MD;  Location: [facility] ENDOSCOPY;  Service: Gastroenterology;  Laterality: N/A;   COLONOSCOPY WITH PROPOFOL  N/A 06/08/2021   Procedure: COLONOSCOPY WITH PROPOFOL ;  Surgeon: Eartha Angelia Sieving, MD;  [facility];  Service: Gastroenterology;  Laterality: N/A;  9:05 /Patient is on dialysis Mon Wed Fri   CORONARY ANGIOPLASTY WITH STENT PLACEMENT  12/15/11   2   CORONARY ANGIOPLASTY WITH STENT PLACEMENT  y/2013   1; makes total of 3 (05/02/2012)   CORONARY ARTERY BYPASS GRAFT  06/13/2012   Procedure: CORONARY ARTERY BYPASS GRAFTING (CABG);  Surgeon: Dallas KATHEE Jude, MD;  Location: [facility] OR;  Service: Open Heart Surgery;  Laterality: N/A;  cabg x four;  using left internal mammary artery, and left leg greater saphenous vein harvested endoscopically   CORONARY STENT  INTERVENTION N/A 10/13/2016   Procedure: Coronary Stent Intervention;  Surgeon: Debby DELENA Sor, MD;  Location: [name] INVASIVE CV LAB;  Service: Cardiovascular;  Laterality: N/A;   DIALYSIS/PERMA CATHETER REMOVAL N/A 04/18/2017   Procedure: DIALYSIS/PERMA CATHETER REMOVAL;  Surgeon: Jama Cordella MATSU, MD;  Location: [facility] INVASIVE CV LAB;  Service: Cardiovascular;  Laterality: N/A;   DILATION AND CURETTAGE OF UTERUS     ENTEROSCOPY N/A 06/08/2021   Procedure: PUSH ENTEROSCOPY;  Surgeon: Eartha Angelia Sieving, MD;  [facility];  Service: Gastroenterology;  Laterality: N/A;   ENTEROSCOPY N/A 11/01/2022   Procedure: ENTEROSCOPY;  Surgeon: Eartha Angelia Sieving, MD;  [facility];  Service: Gastroenterology;  Laterality: N/A;   ENTEROSCOPY N/A 12/01/2022   Procedure: ENTEROSCOPY;  Surgeon: Leigh Elspeth SQUIBB, MD;  Location: [facility] ENDOSCOPY;  Service: Gastroenterology;  Laterality: N/A;   ESOPHAGOGASTRODUODENOSCOPY  01/20/2012   Procedure: ESOPHAGOGASTRODUODENOSCOPY (EGD);  Surgeon: Gwendlyn ONEIDA Buddy, MD,FACG;  Location: [facility] ENDOSCOPY;  Service: Endoscopy;  Laterality: N/A;   ESOPHAGOGASTRODUODENOSCOPY N/A 03/26/2013   Procedure: ESOPHAGOGASTRODUODENOSCOPY (EGD);  Surgeon: Norleen LOISE Kiang, MD;  Location: [facility] ENDOSCOPY;  Service: Endoscopy;  Laterality: N/A;   ESOPHAGOGASTRODUODENOSCOPY N/A 04/30/2015   Procedure: ESOPHAGOGASTRODUODENOSCOPY (EGD);  Surgeon: Claudis RAYMOND Rivet, MD;  [facility];  Service: Endoscopy;  Laterality: N/A;  1pm - moved to 10/20 @ 1:10   ESOPHAGOGASTRODUODENOSCOPY N/A 07/29/2016   Procedure: ESOPHAGOGASTRODUODENOSCOPY (EGD);  Surgeon: Elspeth Deward Leigh, MD;  Location: [facility] ENDOSCOPY;  Service: Gastroenterology;  Laterality: N/A;  enteroscopy   ESOPHAGOGASTRODUODENOSCOPY N/A 09/26/2019   Procedure: ESOPHAGOGASTRODUODENOSCOPY (EGD);  Surgeon: Rivet Claudis RAYMOND, MD;  [facility];  Service: Endoscopy;  Laterality: N/A;  1250    ESOPHAGOGASTRODUODENOSCOPY N/A 08/11/2022   Procedure: ESOPHAGOGASTRODUODENOSCOPY (EGD);  Surgeon: Albertus Gordy HERO, MD;  Location: [facility] ENDOSCOPY;  Service: Gastroenterology;  Laterality: N/A;   ESOPHAGOGASTRODUODENOSCOPY (EGD) WITH PROPOFOL  N/A 02/05/2021   Procedure: ESOPHAGOGASTRODUODENOSCOPY (EGD) WITH PROPOFOL ;  Surgeon: Cindie Carlin POUR, DO;  [facility];  Service: Endoscopy;  Laterality: N/A;   ESOPHAGOGASTRODUODENOSCOPY (EGD) WITH PROPOFOL  N/A 08/27/2022   Procedure: ESOPHAGOGASTRODUODENOSCOPY (EGD) WITH PROPOFOL ;  Surgeon: Cindie Carlin POUR, DO;  [facility];  Service: Endoscopy;  Laterality: N/A;   GIVENS CAPSULE STUDY N/A 03/07/2019   Procedure: GIVENS CAPSULE STUDY;  Surgeon: Rivet Claudis RAYMOND, MD;  [facility];  Service: Endoscopy;  Laterality: N/A;  7:30   GIVENS CAPSULE STUDY N/A 04/22/2021   Procedure: GIVENS CAPSULE STUDY;  Surgeon: Rivet Claudis RAYMOND, MD;  [facility];  Service: Endoscopy;  Laterality: N/A;  7:30   GIVENS CAPSULE STUDY N/A 08/27/2022   Procedure: GIVENS CAPSULE STUDY;  Surgeon: Cindie Carlin POUR, DO;  [facility];  Service: Endoscopy;  Laterality: N/A;   HOT HEMOSTASIS N/A 08/11/2022   Procedure: HOT HEMOSTASIS (ARGON PLASMA COAGULATION/BICAP);  Surgeon: Albertus Gordy HERO, MD;  Location: [facility] ENDOSCOPY;  Service: Gastroenterology;  Laterality: N/A;   HOT HEMOSTASIS N/A 12/01/2022   Procedure: HOT HEMOSTASIS (ARGON PLASMA COAGULATION/BICAP);  Surgeon: Leigh Elspeth SQUIBB, MD;  Location: [facility] ENDOSCOPY;  Service: Gastroenterology;  Laterality: N/A;   INSERTION OF DIALYSIS CATHETER N/A 10/05/2020   Procedure: ABORTED TUNNELED DIALYSIS CATHETER PLACEMENT RIGHT INTERNAL JUGULAR VEIN ;  Surgeon: Kallie Manuelita BROCKS, MD;  Location: [facility];  Service: General;  Laterality: N/A;   INTRAOPERATIVE TRANSESOPHAGEAL ECHOCARDIOGRAM  06/13/2012   Procedure: INTRAOPERATIVE TRANSESOPHAGEAL ECHOCARDIOGRAM;  Surgeon: Dallas KATHEE Jude, MD;   Location: [facility] OR;  Service: Open Heart Surgery;  Laterality: N/A;   IR DIALY SHUNT INTRO NEEDLE/INTRACATH  INITIAL W/IMG LEFT Left 10/06/2020   IR FLUORO GUIDE CV LINE RIGHT  06/17/2020   IR FLUORO GUIDE CV LINE RIGHT  11/12/2022   IR GENERIC HISTORICAL  07/26/2016   IR FLUORO GUIDE CV LINE RIGHT 07/26/2016 Ozell Specking, MD [facility]   IR GENERIC HISTORICAL  07/26/2016   IR US  GUIDE VASC ACCESS RIGHT 07/26/2016 Ozell Specking, MD [facility]   IR GENERIC HISTORICAL  08/02/2016   IR US  GUIDE VASC ACCESS RIGHT 08/02/2016 Ozell Specking, MD [facility]   IR GENERIC HISTORICAL  08/02/2016   IR FLUORO GUIDE CV LINE RIGHT 08/02/2016 Ozell Specking, MD [facility]   IR RADIOLOGY PERIPHERAL GUIDED IV START  03/28/2017   IR REMOVAL TUN CV CATH W/O FL  08/11/2020   IR REMOVAL TUN CV CATH W/O FL  11/15/2022   IR THROMBECTOMY AV FISTULA W/THROMBOLYSIS INC/SHUNT/IMG LEFT Left 06/17/2020   IR US  GUIDE VASC ACCESS LEFT  06/17/2020   IR US  GUIDE VASC ACCESS RIGHT  03/28/2017   IR US  GUIDE VASC ACCESS RIGHT  06/17/2020   IR US  GUIDE VASC ACCESS RIGHT  11/12/2022   LEFT HEART CATH AND CORONARY ANGIOGRAPHY N/A 09/20/2016   Procedure: Left Heart Cath and Coronary Angiography;  Surgeon: Victory LELON Sharps, MD;  Location: [facility] INVASIVE CV LAB;  Service: Cardiovascular;  Laterality: N/A;   LEFT HEART CATH AND CORS/GRAFTS ANGIOGRAPHY N/A 10/13/2016   Procedure: Left Heart Cath and Cors/Grafts Angiography;  Surgeon: Debby DELENA Sor, MD;  Location: [facility] INVASIVE CV LAB;  Service: Cardiovascular;  Laterality: N/A;   LEFT HEART CATH AND CORS/GRAFTS ANGIOGRAPHY N/A 07/13/2018   Procedure: LEFT HEART CATH AND CORS/GRAFTS ANGIOGRAPHY;  Surgeon: [place], Peter M, MD;  Location: [facility] INVASIVE CV LAB;  Service: Cardiovascular;  Laterality: N/A;   LEFT HEART CATH AND CORS/GRAFTS ANGIOGRAPHY N/A 07/22/2021   Procedure: LEFT HEART CATH AND CORS/GRAFTS ANGIOGRAPHY;  Surgeon: Court Dorn PARAS, MD;  Location: [facility] INVASIVE CV LAB;  Service: Cardiovascular;   Laterality: N/A;   LEFT HEART CATHETERIZATION WITH CORONARY ANGIOGRAM N/A 12/15/2011   Procedure: LEFT HEART CATHETERIZATION WITH CORONARY ANGIOGRAM;  Surgeon: Lonni JONETTA Cash, MD;  Location: [facility] CATH LAB;  Service: Cardiovascular;  Laterality: N/A;   LEFT HEART CATHETERIZATION WITH CORONARY ANGIOGRAM N/A 01/10/2012   Procedure: LEFT HEART CATHETERIZATION WITH CORONARY ANGIOGRAM;  Surgeon: Peter M Swaziland, MD;  Location: [facility] CATH LAB;  Service: Cardiovascular;  Laterality: N/A;   LEFT HEART CATHETERIZATION WITH CORONARY ANGIOGRAM N/A 06/08/2012   Procedure: LEFT HEART CATHETERIZATION WITH CORONARY ANGIOGRAM;  Surgeon: Lonni JONETTA Cash, MD;  Location: [facility] CATH LAB;  Service: Cardiovascular;  Laterality: N/A;   LEFT HEART CATHETERIZATION WITH CORONARY/GRAFT ANGIOGRAM N/A 12/10/2013   Procedure: LEFT HEART CATHETERIZATION WITH EL BILE;  Surgeon: Candyce GORMAN Reek, MD;  Location: [facility] CATH LAB;  Service: Cardiovascular;  Laterality: N/A;   OVARY SURGERY     ovarian cancer   POLYPECTOMY  03/09/2019   Procedure: POLYPECTOMY;  Surgeon: Golda Claudis PENNER, MD;  Location: [facility];  Service: Endoscopy;;  cecal    POLYPECTOMY N/A 09/26/2019   Procedure: DUODENAL POLYPECTOMY;  Surgeon: Golda Claudis PENNER, MD;  Location: [facility];  Service: Endoscopy;  Laterality: N/A;   POLYPECTOMY  08/11/2022   Procedure: POLYPECTOMY;  Surgeon: Albertus Gordy HERO, MD;  Location: [facility] ENDOSCOPY;  Service: Gastroenterology;;   REVISION OF ARTERIOVENOUS GORETEX GRAFT N/A 02/24/2017   Procedure: REVISION OF ARTERIOVENOUS GORETEX GRAFT (RESECTION);  Surgeon: Jama Cordella MATSU, MD;  Location: [facility] ORS;  Service:  Vascular;  Laterality: N/A;   REVISON OF ARTERIOVENOUS FISTULA Left 06/19/2020   Procedure: REVISION OF LEFT UPPER ARM AV GRAFT WITH INTERPOSITION JUMP GRAFT USING GORE LIMB;  Surgeon: Gretta Lonni PARAS, MD;  Location: [facility] OR;  Service: Vascular;  Laterality: Left;   SHUNTOGRAM N/A 10/15/2013    Procedure: Fistulogram;  Surgeon: Gaile LELON New, MD;  Location: [facility] CATH LAB;  Service: Cardiovascular;  Laterality: N/A;   SUBMUCOSAL TATTOO INJECTION  11/01/2022   Procedure: SUBMUCOSAL TATTOO INJECTION;  Surgeon: Eartha Angelia Sieving, MD;  Location: [facility];  Service: Gastroenterology;;   THROMBECTOMY / ARTERIOVENOUS GRAFT REVISION  2011   left upper arm   TUBAL LIGATION  1980's   UPPER EXTREMITY ANGIOGRAPHY Bilateral 12/06/2016   Procedure: Upper Extremity Angiography;  Surgeon: Jama Cordella MATSU, MD;  Location: [facility] INVASIVE CV LAB;  Service: Cardiovascular;  Laterality: Bilateral;   UPPER EXTREMITY INTERVENTION Left 06/06/2017   Procedure: UPPER EXTREMITY INTERVENTION;  Surgeon: Jama Cordella MATSU, MD;  Location: [facility] INVASIVE CV LAB;  Service: Cardiovascular;  Laterality: Left;   UPPER EXTREMITY VENOGRAPHY Left 10/04/2022   Procedure: UPPER EXTREMITY VENOGRAPHY;  Surgeon: Jama Cordella MATSU, MD;  Location: [facility] INVASIVE CV LAB;  Service: Cardiovascular;  Laterality: Left;    Allergies:  Allergies  Allergen Reactions   Amlodipine  Swelling   Aspirin  Other (See Comments)    High Doses Mess up her stomach; makes my bowels have blood in them. Takes 81 mg EC Aspirin     Nitrofurantoin Hives   Ranexa  [Ranolazine ] Other (See Comments)    Myoclonus-hospitalized    Bactrim [Sulfamethoxazole-Trimethoprim] Rash   Gabapentin  Other (See Comments)    Unknown reaction   Iodinated Contrast Media Itching and Other (See Comments)   Iron  Itching and Other (See Comments)    they gave me iron  in dialysis; had to give me Benadryl  cause I had to have the iron  (05/02/2012)   Sucroferric Oxyhydroxide Other (See Comments)    Unknown   Tylenol  [Acetaminophen ] Itching and Other (See Comments)    Makes her feet on fire per pt - can tolerate per pt   Levaquin  [Levofloxacin  In D5w] Rash   Levofloxacin  Rash   Plavix  [Clopidogrel  Bisulfate] Rash   Protonix  [Pantoprazole  Sodium] Rash   Venofer  [Ferric Oxide] Itching and Other (See Comments)    Patient reports using Benadryl  prior to doses as [facility]    Medications:   Prior to Admission medications   Medication Sig Start Date End Date Taking? Authorizing Provider  albuterol  (PROVENTIL ) (2.5 MG/3ML) 0.083% nebulizer solution USE 1 VIAL IN NEBULIZER 4 TIMES DAILY AS NEEDED FOR COUGH, WHEEZING, AND SHORTNESS OF BREATH 10/20/23  Yes [provider]  albuterol  (VENTOLIN  HFA) 108 (90 Base) MCG/ACT inhaler SMARTSIG:2 Puff(s) By Mouth Every 4-6 Hours PRN 10/10/23  Yes [provider]  benzonatate  (TESSALON ) 100 MG capsule Take 1 capsule (100 mg total) by mouth 2 (two) times daily as needed for cough. 11/05/23  Yes Johnson, Clanford L, MD  calcitRIOL  (ROCALTROL ) 0.25 MCG capsule Take 7 capsules (1.75 mcg total) by mouth every Monday, Wednesday, and Friday with hemodialysis. 12/21/22  Yes Christobal Guadalajara, MD  cinacalcet  (SENSIPAR ) 30 MG tablet Take 1 tablet (30 mg total) by mouth every Monday, Wednesday, and Friday with hemodialysis. 12/19/22  Yes Christobal Guadalajara, MD  cyanocobalamin  (VITAMIN B12) 1000 MCG/ML injection Inject 1,000 mcg into the muscle every 30 (thirty) days.   Yes [provider]  famotidine  (PEPCID ) 20 MG tablet Take 1 tablet (20  mg total) by mouth daily as needed for heartburn or indigestion. 09/10/23 09/09/24 Yes Johnson, Clanford L, MD  folic acid  (FOLVITE ) 1 MG tablet Take 1 mg by mouth daily. 05/24/22  Yes [provider]  guaifenesin  (ROBITUSSIN) 100 MG/5ML syrup Take 200 mg by mouth 3 (three) times daily as needed for cough.   Yes [provider]  hydrOXYzine  (ATARAX ) 25 MG tablet Take 25 mg by mouth every 6 (six) hours as needed for itching. 06/06/22  Yes [provider]  isosorbide  mononitrate (IMDUR ) 30 MG 24 hr tablet Take 1 tablet (30 mg total) by mouth daily. 11/15/23  Yes Cindy Garnette POUR, MD  levETIRAcetam  (KEPPRA ) 500 MG tablet Take 1 tablet (500 mg total) by mouth daily. 11/15/22   Yes Singh, Prashant K, MD  levothyroxine  (SYNTHROID ) 50 MCG tablet Take 50 mcg by mouth daily. 08/27/23  Yes [provider]  metoprolol  succinate (TOPROL  XL) 25 MG 24 hr tablet Take 1 tablet (25 mg total) by mouth 2 (two) times daily. Take an extra 25 mg for heart rate over 100 11/30/23  Yes Furth, Cadence H, PA-C  multivitamin (RENA-VIT) TABS tablet Take 1 tablet by mouth daily.   Yes [provider]  nitroGLYCERIN  (NITROSTAT ) 0.4 MG SL tablet Place 0.4 mg under the tongue every 5 (five) minutes as needed for chest pain.   Yes [provider]  octreotide  (SANDOSTATIN  LAR) 20 MG injection Inject 20 mg into the muscle every 28 (twenty-eight) days. 11/15/22  Yes Dennise Lavada POUR, MD  ondansetron  (ZOFRAN -ODT) 4 MG disintegrating tablet Take 4 mg by mouth every 8 (eight) hours as needed for nausea or vomiting. 05/22/23  Yes [provider]  rosuvastatin  (CRESTOR ) 10 MG tablet Take 1 tablet by mouth once daily 06/06/22  Yes Strader, Brittany M, PA-C    Discontinued Meds:   Medications Discontinued During This Encounter  Medication Reason   calcitRIOL  (ROCALTROL ) capsule 2 mcg     Social History:  reports that she has never smoked. She has never used smokeless tobacco. She reports that she does not drink alcohol  and does not use drugs.  Family History:   Family History  Problem Relation Age of Onset   Heart disease Mother        Heart Disease before age 30   Hyperlipidemia Mother    Hypertension Mother    Diabetes Mother    Heart attack Mother    Heart disease Father        Heart Disease before age 56   Hyperlipidemia Father    Hypertension Father    Diabetes Father    Diabetes Sister    Hypertension Sister    Diabetes Brother    Hyperlipidemia Brother    Heart attack Brother    Hypertension Sister    Heart attack Brother    Colon cancer Child 18   Other Other        noncontributory for early CAD   Esophageal cancer Neg Hx    Liver disease Neg  Hx    Kidney disease Neg Hx    Colon polyps Neg Hx     Blood pressure (!) 180/93, pulse 73, temperature 98 F (36.7 C), temperature source Oral, resp. rate 18, height 5' 1 (1.549 m), weight 49.4 kg, SpO2 99%. Physical Exam: GEN: NAD, A&Ox3, NCAT, frail emaciated HEENT: EOMI NECK: Supple, no thyromegaly LUNGS: CTA B/L no rales, rhonchi or wheezing CV: RRR, No M/R/G ABD: SNDNT +BS  EXT: No lower extremity edema ACCESS: Left  IJ tunneled catheter, left upper arm AV graft clotted      Jennifer Payes, LYNWOOD ORN, MD 01/04/2024, 8:49 AM

## 2024-01-04 NOTE — Anesthesia Preprocedure Evaluation (Addendum)
 Anesthesia Evaluation  Patient identified by MRN, date of birth, ID band Patient confused    Reviewed: Allergy & Precautions, H&P , NPO status , Patient's Chart, lab work & pertinent test results, reviewed documented beta blocker date and time   Airway Mallampati: II  TM Distance: >3 FB Neck ROM: full    Dental no notable dental hx. (+) Edentulous Lower, Edentulous Upper   Pulmonary neg pulmonary ROS, shortness of breath, pneumonia   Pulmonary exam normal breath sounds clear to auscultation       Cardiovascular Exercise Tolerance: Good hypertension, + CAD, + Past MI, + Cardiac Stents and +CHF  + dysrhythmias Atrial Fibrillation  Rhythm:regular Rate:Normal     Neuro/Psych  PSYCHIATRIC DISORDERS Anxiety     TIA Neuromuscular disease negative neurological ROS  negative psych ROS   GI/Hepatic negative GI ROS, Neg liver ROS, PUD,GERD  ,,  Endo/Other  negative endocrine ROSdiabetesHypothyroidism    Renal/GU ESRF and DialysisRenal diseasenegative Renal ROS  negative genitourinary   Musculoskeletal  (+) Arthritis ,    Abdominal   Peds  Hematology negative hematology ROS (+) Blood dyscrasia, anemia   Anesthesia Other Findings   Reproductive/Obstetrics negative OB ROS                             Anesthesia Physical Anesthesia Plan  ASA: 4 and emergent  Anesthesia Plan: General   Post-op Pain Management:    Induction:   PONV Risk Score and Plan: Propofol  infusion  Airway Management Planned:   Additional Equipment:   Intra-op Plan:   Post-operative Plan:   Informed Consent: I have reviewed the patients History and Physical, chart, labs and discussed the procedure including the risks, benefits and alternatives for the proposed anesthesia with the patient or authorized representative who has indicated his/her understanding and acceptance.     Dental Advisory Given  Plan Discussed  with: CRNA  Anesthesia Plan Comments:        Anesthesia Quick Evaluation

## 2024-01-04 NOTE — Plan of Care (Signed)
   Problem: Education: Goal: Knowledge of General Education information will improve Description: Including pain rating scale, medication(s)/side effects and non-pharmacologic comfort measures Outcome: Not Progressing

## 2024-01-05 ENCOUNTER — Encounter (HOSPITAL_COMMUNITY): Payer: Self-pay | Admitting: Gastroenterology

## 2024-01-05 ENCOUNTER — Other Ambulatory Visit: Payer: Self-pay

## 2024-01-05 ENCOUNTER — Telehealth: Payer: Self-pay | Admitting: Gastroenterology

## 2024-01-05 DIAGNOSIS — K5710 Diverticulosis of small intestine without perforation or abscess without bleeding: Secondary | ICD-10-CM | POA: Diagnosis not present

## 2024-01-05 DIAGNOSIS — E872 Acidosis, unspecified: Secondary | ICD-10-CM

## 2024-01-05 DIAGNOSIS — K31819 Angiodysplasia of stomach and duodenum without bleeding: Secondary | ICD-10-CM | POA: Diagnosis not present

## 2024-01-05 DIAGNOSIS — G9341 Metabolic encephalopathy: Secondary | ICD-10-CM

## 2024-01-05 DIAGNOSIS — E7220 Disorder of urea cycle metabolism, unspecified: Secondary | ICD-10-CM

## 2024-01-05 DIAGNOSIS — Z515 Encounter for palliative care: Secondary | ICD-10-CM | POA: Diagnosis not present

## 2024-01-05 DIAGNOSIS — R195 Other fecal abnormalities: Secondary | ICD-10-CM | POA: Diagnosis not present

## 2024-01-05 DIAGNOSIS — K2971 Gastritis, unspecified, with bleeding: Secondary | ICD-10-CM | POA: Diagnosis not present

## 2024-01-05 DIAGNOSIS — Z66 Do not resuscitate: Secondary | ICD-10-CM | POA: Diagnosis not present

## 2024-01-05 DIAGNOSIS — D649 Anemia, unspecified: Secondary | ICD-10-CM | POA: Diagnosis not present

## 2024-01-05 LAB — COMPREHENSIVE METABOLIC PANEL WITH GFR
ALT: 28 U/L (ref 0–44)
AST: 38 U/L (ref 15–41)
Albumin: 3.2 g/dL — ABNORMAL LOW (ref 3.5–5.0)
Alkaline Phosphatase: 244 U/L — ABNORMAL HIGH (ref 38–126)
Anion gap: 17 — ABNORMAL HIGH (ref 5–15)
BUN: 33 mg/dL — ABNORMAL HIGH (ref 8–23)
CO2: 21 mmol/L — ABNORMAL LOW (ref 22–32)
Calcium: 9.2 mg/dL (ref 8.9–10.3)
Chloride: 99 mmol/L (ref 98–111)
Creatinine, Ser: 4.1 mg/dL — ABNORMAL HIGH (ref 0.44–1.00)
GFR, Estimated: 10 mL/min — ABNORMAL LOW (ref >60)
Glucose, Bld: 126 mg/dL — ABNORMAL HIGH (ref 70–99)
Potassium: 4.2 mmol/L (ref 3.5–5.1)
Sodium: 137 mmol/L (ref 135–145)
Total Bilirubin: 3.3 mg/dL — ABNORMAL HIGH (ref 0.0–1.2)
Total Protein: 7.3 g/dL (ref 6.5–8.1)

## 2024-01-05 LAB — CBC
HCT: 41.7 % (ref 36.0–46.0)
Hemoglobin: 13.6 g/dL (ref 12.0–15.0)
MCH: 30.8 pg (ref 26.0–34.0)
MCHC: 32.6 g/dL (ref 30.0–36.0)
MCV: 94.3 fL (ref 80.0–100.0)
Platelets: 179 10*3/uL (ref 150–400)
RBC: 4.42 MIL/uL (ref 3.87–5.11)
RDW: 24.7 % — ABNORMAL HIGH (ref 11.5–15.5)
WBC: 7.1 10*3/uL (ref 4.0–10.5)
nRBC: 0 % (ref 0.0–0.2)

## 2024-01-05 LAB — LACTIC ACID, PLASMA
Lactic Acid, Venous: 3 mmol/L (ref 0.5–1.9)
Lactic Acid, Venous: 1.2 mmol/L (ref 0.5–1.9)

## 2024-01-05 LAB — GLUCOSE, CAPILLARY
Glucose-Capillary: 117 mg/dL — ABNORMAL HIGH (ref 70–99)
Glucose-Capillary: 112 mg/dL — ABNORMAL HIGH (ref 70–99)
Glucose-Capillary: 87 mg/dL (ref 70–99)
Glucose-Capillary: 89 mg/dL (ref 70–99)

## 2024-01-05 LAB — BILIRUBIN, FRACTIONATED(TOT/DIR/INDIR)
Bilirubin, Direct: 1.4 mg/dL — ABNORMAL HIGH (ref 0.0–0.2)
Indirect Bilirubin: 1.8 mg/dL — ABNORMAL HIGH (ref 0.3–0.9)
Total Bilirubin: 3.2 mg/dL — ABNORMAL HIGH (ref 0.0–1.2)

## 2024-01-05 LAB — MAGNESIUM: Magnesium: 1.9 mg/dL (ref 1.7–2.4)

## 2024-01-05 LAB — PHOSPHORUS: Phosphorus: 3.4 mg/dL (ref 2.5–4.6)

## 2024-01-05 LAB — AMMONIA: Ammonia: 54 umol/L — ABNORMAL HIGH (ref 9–35)

## 2024-01-05 MED ORDER — FOLIC ACID 1 MG PO TABS: 1.0000 mg | ORAL_TABLET | Freq: Every day | ORAL | 3 refills | Status: DC

## 2024-01-05 MED ORDER — LEVETIRACETAM 500 MG PO TABS: 500.0000 mg | ORAL_TABLET | Freq: Every day | ORAL | 5 refills | Status: DC

## 2024-01-05 MED ORDER — LACTULOSE ENEMA
300.0000 mL | Freq: Once | ORAL | Status: AC
  Administered 2024-01-05: 300 mL via RECTAL
  Filled 2024-01-05: qty 300

## 2024-01-05 MED ORDER — CYANOCOBALAMIN 1000 MCG/ML IJ SOLN
1000.0000 ug | INTRAMUSCULAR | 5 refills | Status: DC
Start: 2024-01-05 — End: 2024-01-15

## 2024-01-05 MED ORDER — ALBUTEROL SULFATE HFA 108 (90 BASE) MCG/ACT IN AERS: 2.0000 | INHALATION_SPRAY | Freq: Four times a day (QID) | RESPIRATORY_TRACT | 2 refills | Status: DC | PRN

## 2024-01-05 MED ORDER — HYDRALAZINE HCL 50 MG PO TABS: 50.0000 mg | ORAL_TABLET | Freq: Three times a day (TID) | ORAL | 3 refills | Status: DC

## 2024-01-05 MED ORDER — LEVOTHYROXINE SODIUM 50 MCG PO TABS: 50.0000 ug | ORAL_TABLET | Freq: Every day | ORAL | 5 refills | Status: DC

## 2024-01-05 MED ORDER — LACTULOSE ENEMA
300.0000 mL | Freq: Once | ORAL | Status: DC
  Filled 2024-01-05: qty 300

## 2024-01-05 MED ORDER — OCTREOTIDE ACETATE 20 MG IM KIT: 20.0000 mg | PACK | INTRAMUSCULAR | 5 refills | Status: DC

## 2024-01-05 MED ORDER — ALBUTEROL SULFATE (2.5 MG/3ML) 0.083% IN NEBU: 2.5000 mg | INHALATION_SOLUTION | RESPIRATORY_TRACT | 12 refills | Status: DC | PRN

## 2024-01-05 MED ORDER — LACTULOSE 10 GM/15ML PO SOLN
20.0000 g | Freq: Three times a day (TID) | ORAL | Status: DC
  Filled 2024-01-05 (×2): qty 30

## 2024-01-05 MED ORDER — LACTULOSE 10 GM/15ML PO SOLN: 20.0000 g | Freq: Every day | ORAL | 5 refills | Status: DC

## 2024-01-05 MED ORDER — ACETAMINOPHEN 325 MG PO TABS: 650.0000 mg | ORAL_TABLET | Freq: Four times a day (QID) | ORAL | Status: DC | PRN

## 2024-01-05 MED ORDER — LACTULOSE ENEMA: 300.0000 mL | Freq: Once | ORAL | Status: DC

## 2024-01-05 MED ORDER — HYDROXYZINE HCL 25 MG PO TABS: 25.0000 mg | ORAL_TABLET | Freq: Four times a day (QID) | ORAL | 5 refills | Status: DC | PRN

## 2024-01-05 MED ORDER — ISOSORBIDE MONONITRATE ER 30 MG PO TB24: 30.0000 mg | ORAL_TABLET | Freq: Every day | ORAL | 5 refills | Status: DC

## 2024-01-05 MED ORDER — FAMOTIDINE 20 MG PO TABS
20.0000 mg | ORAL_TABLET | Freq: Two times a day (BID) | ORAL | 3 refills | Status: DC
Start: 2024-01-05 — End: 2024-01-15

## 2024-01-05 MED ORDER — RENA-VITE PO TABS: 1.0000 | ORAL_TABLET | Freq: Every day | ORAL | 5 refills | Status: DC

## 2024-01-05 MED ORDER — METOPROLOL SUCCINATE ER 25 MG PO TB24: 25.0000 mg | ORAL_TABLET | Freq: Two times a day (BID) | ORAL | 3 refills | Status: DC

## 2024-01-05 MED ORDER — LACTULOSE 10 GM/15ML PO SOLN
20.0000 g | Freq: Two times a day (BID) | ORAL | Status: DC
  Filled 2024-01-05: qty 30

## 2024-01-05 NOTE — Progress Notes (Signed)
 Progress Note   Patient: Shawna [name] FMW:[id number] DOB: 1940-07-30 DOA: 01/01/2024     0 DOS: the patient was seen and examined on 01/05/2024   Brief hospital admission narrative: As per H&P written by Dr. Charlton on 01/01/24 Shawna [name] is an 83 y.o. female with medical history significant for AVMs with recurrent GI bleeding, chronic HFpEF, CAD status post CABG, PAF not anticoagulated, colon cancer status postresection, type 2 diabetes mellitus, and ESRD on hemodialysis who presents with low blood glucose.   Patient was reportedly in her usual state of health today at home, ate breakfast, and went to dialysis where she reportedly completed her session.  She was noted to be somnolent following the treatment and CBG was in the low 60s.  She was given food and drink but repeat CBG was still in the low 60s and she was sent to the ED for evaluation.     Patient has no complaints herself.  Patient has been evaluated by GI numerous times in the hospital and receives long-acting octreotide  injections and blood transfusions.   ED Course: Upon arrival to the ED, patient is found to be afebrile and saturating well on room air with stable BP.  Labs are most notable for potassium 2.5, albumin  2.9, and hemoglobin 6.5.   Patient was treated with IV potassium and had 2 units RBC ordered for transfusion from the ED.  Assessment and plan 1-symptomatic acute on chronic blood loss anemia -Hemoglobin at time of admission 6.5 - 2 units PRBC provided - Hemodynamically stable otherwise - Prior history of AVMs; over a year without endoscopic evaluation.  Receiving octreotide  as an outpatient. - Appreciate assistance and recommendation by GI; planning for endoscopy evaluation later today 01/04/2024 - Continue to follow hemoglobin trend - Continue Pepcid ; patient allergic to PPI. - Patient with complaining of chronic anemia from renal failure - IV iron  and Epogen  as per nephrology discretion.  2-ESRD - Monday,  Wednesday and Friday (regular schedule). - Tolerated dialysis treatment well on 01/03/2024. - Continue renally dosing medications and follow renal diet - Appreciate assistance and recommendation by nephrology service; planning for dialysis again tomorrow.  3-type 2 diabetes - Mild hypoglycemia appreciated at time of admission - Continue to follow CBGs fluctuation - Sliding scale insulin  will be provided.  4-hypokalemia - Repleted - Continue to follow trend.  5-paroxysmal atrial fibrillation - Continue Toprol  - Not a candidate for anticoagulation therapy with ongoing history of GI bleed.  6-coronary artery disease/hypertension - No anginal symptoms - Continue Imdur , Crestor  and metoprolol  - As needed hydralazine  also ordered.  7-history of seizure - Continue Keppra   8-hypothyroidism - Continue Synthroid   9-GERD - Patient allergic to PPI - Continue Pepcid .  10) acute metabolic encephalopathy--elevated ammonia noted - Poor oral intake - Will give rectal lactulose  Subjective:  - Remains sleepy oriented x 1 only - Tolerated hemodialysis well--- lethargy persists despite hemodialysis  Physical Exam: Vitals:   01/05/24 1308 01/05/24 1433 01/05/24 1519 01/05/24 1942  BP: (!) 157/102 (!) 152/104 (!) 160/115 (!) 158/91  Pulse: (!) 126 (!) 131 (!) 128 (!) 129  Resp: (!) 24 16 20    Temp: 97.6 F (36.4 C) 98 F (36.7 C) 97.8 F (36.6 C) 98.9 F (37.2 C)  TempSrc: Axillary Axillary Axillary Oral  SpO2: 99% 100% 100% 100%  Weight:      Height:       General exam: Alert, awake, oriented x 1 and following commands appropriately; no upper bleeding appreciated. Respiratory system:  Good saturation on room air. Cardiovascular system: Rate controlled, no rubs, no gallops, no JVD on exam. Gastrointestinal system: Abdomen is nondistended, soft and nontender.  Positive bowel sounds. Central nervous system: Moving 4 limbs spontaneously.  No focal neurological  deficits. Extremities: No cyanosis or clubbing. Skin: No petechiae. Psychiatry: Judgement and insight appear impaired secondary to underlying dementia.    Family Communication: Husband updated over the phone (01/05/2024).  Disposition: Status is: Observation The patient remains OBS appropriate and will d/c before 2 midnights.  Anticipating discharge back home once medically stable.  Time spent: 50 minutes  Author: [name], MD 01/05/2024 8:32 PM  For on call review [email or web address].

## 2024-01-05 NOTE — Progress Notes (Addendum)
 Rapid response  Rapid response was called due to altered mental status, agitation and tachycardia.  At bedside, patient appeared to be delirious, but she was not screaming and acute distress.  BP (!) 139/103   Pulse (!) 122   Temp 98.7 F (37.1 C) (Oral)   Resp 15   Ht 5' 1 (1.549 m)   Wt 49.1 kg   SpO2 100%   BMI 20.45 kg/m    EKG was done and showed wide QRS tachycardia with prolonged QTc of 572 ms.  Workup done showed lactic acid of 3.0, ammonia 54.     Latest Ref Rng & Units 01/05/2024    3:21 AM 01/04/2024    4:11 AM 01/03/2024    4:23 AM  CBC  WBC 4.0 - 10.5 K/uL 7.1  6.7  5.7   Hemoglobin 12.0 - 15.0 g/dL 86.3  87.3  86.6   Hematocrit 36.0 - 46.0 % 41.7  38.3  39.6   Platelets 150 - 400 K/uL 179  182  162       Latest Ref Rng & Units 01/05/2024    3:21 AM 01/04/2024   10:26 AM 01/03/2024    4:23 AM  BMP  Glucose 70 - 99 mg/dL 873  82  83   BUN 8 - 23 mg/dL 33  27  40   Creatinine 0.44 - 1.00 mg/dL 5.89  6.55  5.97   Sodium 135 - 145 mmol/L 137  136  133   Potassium 3.5 - 5.1 mmol/L 4.2  3.8  3.8   Chloride 98 - 111 mmol/L 99  99  99   CO2 22 - 32 mmol/L 21  25  22    Calcium  8.9 - 10.3 mg/dL 9.2  8.5  8.5    Assessment and plan Acute metabolic encephalopathy due to hyperammonemia Lactulose was given  Lactic acidosis This may be corrected in dialysis today  Total time:  38 minutes This includes time reviewing the chart including progress notes, labs, EKGs, taking medical decisions, ordering labs and documenting findings.   Please refer to admission H&P, consult notes and progress notes regarding details of care of this patient.

## 2024-01-05 NOTE — Progress Notes (Signed)
 Date and time results received: 01/05/24 0428  Test: lactic acid    Critical Value: 3.0    Name of Provider Notified: Dr. Manfred    Orders Received? Or Actions Taken?: awaiting orders

## 2024-01-05 NOTE — Discharge Instructions (Signed)
 1)Very Low-salt diet advised---Less than 2 gm of Sodium per day advised----ok to use Mrs DASH salt substitute instead of Salt 2)Weigh yourself daily, call if you gain more than 3 pounds in 1 day or more than 5 pounds in 1 week as your diuretic medications may need to be adjusted 3)Limit your Fluid  intake to no more than 60 ounces (1.8 Liters) per day 4)Avoid ibuprofen /Advil /Aleve/Motrin Josefine Powders/Naproxen/BC powders/Meloxicam/Diclofenac /Indomethacin and other Nonsteroidal anti-inflammatory medications as these will make you more likely to bleed and can cause stomach ulcers, can also cause Kidney problems.  5)Continue hemodialysis on Mondays Wednesdays and Fridays 6)Repeat CBC blood test in 1 week

## 2024-01-05 NOTE — Progress Notes (Signed)
 Patient presenting with altered mentation and agitation and tachycardia. Dr. Manfred notified. EKG obtained. Rapid response team and Dr. Manfred to bedside. See new orders.

## 2024-01-05 NOTE — Progress Notes (Addendum)
 Pt presents with AMS. Continues to refuse meds. Dr. Adefeso was made aware. Pt was given a lactulose enema per orders. Did not tolerate well, was not able to retain for 30-60 minutes.

## 2024-01-05 NOTE — Progress Notes (Signed)
   01/05/24 1308  Assess: MEWS Score  Temp 97.6 F (36.4 C)  BP (!) 157/102  MAP (mmHg) 118  Pulse Rate (!) 126  Resp (!) 24  Level of Consciousness New agitation confusion  SpO2 99 %  O2 Device Room Air  Assess: MEWS Score  MEWS Temp 0  MEWS Systolic 0  MEWS Pulse 2  MEWS RR 1  MEWS LOC 1  MEWS Score 4  MEWS Score Color Red  Assess: if the MEWS score is Yellow or Red  Were vital signs accurate and taken at a resting state? Yes  Does the patient meet 2 or more of the SIRS criteria? No  Notify: Charge Nurse/RN  Name of Charge Nurse/RN Notified [facility]  Provider Notification  Provider Name/Title Dr. JAYSON Carwin  Date Provider Notified 01/05/24  Time Provider Notified 1320  Method of Notification Page  Provider response No new orders  Assess: SIRS CRITERIA  SIRS Temperature  0  SIRS Respirations  1  SIRS Pulse 1  SIRS WBC 0  SIRS Score Sum  2

## 2024-01-05 NOTE — Progress Notes (Signed)
 Shawna [name] is an 83 y.o. female with ESRD, HFrEF, CASHD, hx of GI bleeds, history of TIA and pAfib who presented to the hospital with low blood sugar.  She had gone to dialysis on 6/23 and completed a treatment per charting but was noted to be somnolent at dialysis following her HD session.  She was mumbling and not herself.  Per charting, her blood sugar was in the low 60s which persisted despite PO intake.  In ED, hemoglobin 6.5 and received two units PRBC's.  She is followed by GI and receives long-acting octreotide  injections to  help optimize.    Outpatient HD orders:  [facility]  MWF  4 hours  BF 300 DF 500 ml/min EDW 50.5 kg  Bath: 2K/2.5 ca Meds: mircera 200 mcg given on 6/23, calcitriol  2 mcg PO with HD and cincalcet 30 mg with HD  Last post weight:  48.6 kg on 6/23 and left at 49 kg on 6/20  Assessment/Plan: # End-stage renal disease - Hemodialysis today per her routine Monday Wednesday Friday schedule - Tolerated dialysis on Wednesday with 1.5 L net UF; unable to UF too much because of significant anemia.  Seen on dialysis with a 3K bath, does not appear to have that much fluid on her; will only attempt 1 L ultrafiltration 118/68 with a heart rate of 125 Confused but not aggressive and sleeping comfortably   # Anemia of acute blood loss - Compounded by anemia of chronic disease - Note history of GI bleeds; Hb 13.6 this AM - Management per primary team.   - She is known to GI and is on long-acting octreotide  injections - note she received mircera on 6/23 outpatient   # HTN  - optimize UF with HD  - note that EDW will need to be lowered.  She has consistently been at 49 kg outpatient x 2 treatments so will try for that today (does not appear to have much fluid on her and only will try for 1 L if tolerated; so far tolerating)   # Heart failure with reduced ejection fraction - Optimize volume status with HD   # Metabolic bone disease - Noted outpatient Sensipar  and  activated vitamin D  with HD.  In her case both of these are actually given at the HD unit.  Would not include on discharge summary so as to prevent duplicate dosing  - will continue inpatient   # Encephalopathy  - On presentation and she is still altered today; easily arousable and opens eyes but really not following commands.  Mainly moaning and  mumbling   Subjective: Confused, lethargic, not really answering questions this morning; on hd    Chemistry and CBC: Creat  Date/Time Value Ref Range Status  03/05/2019 11:59 AM 6.57 (H) 0.60 - 0.93 mg/dL Final    Comment:    For patients >10 years of age, the reference limit for Creatinine is approximately 13% higher for people identified as African-American. .    Creatinine, Ser  Date/Time Value Ref Range Status  01/05/2024 03:21 AM 4.10 (H) 0.44 - 1.00 mg/dL Final  93/73/7974 89:73 AM 3.44 (H) 0.44 - 1.00 mg/dL Final  93/74/7974 95:76 AM 4.02 (H) 0.44 - 1.00 mg/dL Final    Comment:    DELTA CHECK NOTED  01/02/2024 09:47 AM 2.40 (H) 0.44 - 1.00 mg/dL Final  93/76/7974 92:85 PM 2.49 (H) 0.44 - 1.00 mg/dL Final  93/88/7974 98:43 PM 6.40 (H) 0.44 - 1.00 mg/dL Final  94/77/7974 91:80 PM  3.71 (H) 0.44 - 1.00 mg/dL Final  94/93/7974 94:79 AM 4.31 (H) 0.44 - 1.00 mg/dL Final  94/94/7974 93:96 AM 6.38 (H) 0.44 - 1.00 mg/dL Final  94/95/7974 95:92 AM 5.10 (H) 0.44 - 1.00 mg/dL Final  94/96/7974 96:61 PM 4.42 (H) 0.44 - 1.00 mg/dL Final  94/97/7974 90:91 AM 4.48 (H) 0.44 - 1.00 mg/dL Final  94/98/7974 95:89 AM 3.08 (H) 0.44 - 1.00 mg/dL Final  95/69/7974 95:40 AM 4.76 (H) 0.44 - 1.00 mg/dL Final  95/70/7974 95:61 AM 3.27 (H) 0.44 - 1.00 mg/dL Final  95/71/7974 95:71 AM 4.24 (H) 0.44 - 1.00 mg/dL Final  95/73/7974 95:41 AM 4.71 (H) 0.44 - 1.00 mg/dL Final  95/74/7974 98:94 PM 4.29 (H) 0.44 - 1.00 mg/dL Final  96/68/7974 98:53 PM 6.08 (H) 0.44 - 1.00 mg/dL Final  96/71/7974 88:84 AM 4.86 (H) 0.44 - 1.00 mg/dL Final  96/73/7974 88:84 AM  4.56 (H) 0.44 - 1.00 mg/dL Final  96/74/7974 91:64 PM 3.95 (H) 0.44 - 1.00 mg/dL Final  96/87/7974 87:57 PM 4.09 (H) 0.44 - 1.00 mg/dL Final  96/91/7974 94:86 PM 2.76 (H) 0.44 - 1.00 mg/dL Final  97/71/7974 97:98 PM 4.60 (H) 0.44 - 1.00 mg/dL Final  97/71/7974 97:98 PM 4.59 (H) 0.44 - 1.00 mg/dL Final  97/87/7974 95:75 AM 4.05 (H) 0.44 - 1.00 mg/dL Final  97/89/7974 94:86 PM 4.37 (H) 0.44 - 1.00 mg/dL Final  97/90/7974 95:92 AM 3.78 (H) 0.44 - 1.00 mg/dL Final  97/91/7974 91:43 PM 3.36 (H) 0.44 - 1.00 mg/dL Final  98/71/7974 91:51 AM 4.52 (H) 0.44 - 1.00 mg/dL Final  98/72/7974 94:87 AM 6.53 (H) 0.44 - 1.00 mg/dL Final  98/73/7974 97:86 AM 4.90 (H) 0.44 - 1.00 mg/dL Final  98/74/7974 97:58 PM 4.33 (H) 0.44 - 1.00 mg/dL Final  87/68/7975 95:69 PM 6.65 (H) 0.44 - 1.00 mg/dL Final  87/69/7975 96:85 PM 5.84 (H) 0.44 - 1.00 mg/dL Final  88/74/7975 88:77 AM 4.74 (H) 0.44 - 1.00 mg/dL Final    Comment:    DELTA CHECK NOTED  06/04/2023 05:29 AM 3.07 (H) 0.44 - 1.00 mg/dL Final  88/76/7975 90:41 AM 4.70 (H) 0.44 - 1.00 mg/dL Final  88/78/7975 87:79 PM 5.15 (H) 0.44 - 1.00 mg/dL Final  88/78/7975 87:79 PM 5.12 (H) 0.44 - 1.00 mg/dL Final  88/79/7975 87:43 PM 4.41 (H) 0.44 - 1.00 mg/dL Final  88/97/7975 96:99 PM 5.43 (H) 0.44 - 1.00 mg/dL Final  89/69/7975 97:89 PM 5.46 (H) 0.44 - 1.00 mg/dL Final  89/77/7975 95:46 AM 5.88 (H) 0.44 - 1.00 mg/dL Final  89/78/7975 97:43 PM 5.42 (H) 0.44 - 1.00 mg/dL Final  89/86/7975 91:61 PM 4.61 (H) 0.44 - 1.00 mg/dL Final  89/88/7975 94:76 PM 4.58 (H) 0.44 - 1.00 mg/dL Final  90/69/7975 95:81 PM 5.40 (H) 0.44 - 1.00 mg/dL Final  90/72/7975 95:67 PM 5.29 (H) 0.44 - 1.00 mg/dL Final  91/69/7975 91:69 AM 4.07 (H) 0.44 - 1.00 mg/dL Final  91/70/7975 96:68 AM 6.31 (H) 0.44 - 1.00 mg/dL Final   Recent Labs  Lab 01/01/24 1914 01/02/24 0947 01/03/24 0423 01/04/24 1026 01/05/24 0321  NA 137 137 133* 136 137  K 2.5* 3.6 3.8 3.8 4.2  CL 98 109 99 99 99  CO2  25 18* 22 25 21*  GLUCOSE 148* 97 83 82 126*  BUN 20 23 40* 27* 33*  CREATININE 2.49* 2.40* 4.02* 3.44* 4.10*  CALCIUM  8.3* 6.7* 8.5* 8.5* 9.2  PHOS  --   --  3.3  --  3.4  Recent Labs  Lab 01/01/24 1914 01/02/24 0947 01/03/24 0423 01/04/24 0411 01/05/24 0321  WBC 8.6 8.9 5.7 6.7 7.1  NEUTROABS 6.9  --   --   --   --   HGB 6.5* 13.3 13.3 12.6 13.6  HCT 20.2* 39.1 39.6 38.3 41.7  MCV 101.0* 96.1 94.3 93.2 94.3  PLT 194 147* 162 182 179   Liver Function Tests: Recent Labs  Lab 01/01/24 1914 01/03/24 0423 01/04/24 1026 01/05/24 0321 01/05/24 1000  AST 46*  --  32 38  --   ALT 35  --  24 28  --   ALKPHOS 228*  --  219* 244*  --   BILITOT 1.3*  --  2.7* 3.3* 3.2*  PROT 7.0  --  6.3* 7.3  --   ALBUMIN  2.9* 3.1* 2.7* 3.2*  --    No results for input(s): LIPASE, AMYLASE in the last 168 hours. Recent Labs  Lab 01/05/24 0321  AMMONIA 54*   Cardiac Enzymes: No results for input(s): CKTOTAL, CKMB, CKMBINDEX, TROPONINI in the last 168 hours. Iron  Studies: No results for input(s): IRON , TIBC, TRANSFERRIN, FERRITIN in the last 72 hours. PT/INR: @LABRCNTIP (inr:5)  Xrays/Other Studies: ) Results for orders placed or performed during the hospital encounter of 01/01/24 (from the past 48 hours)  Glucose, capillary     Status: None   Collection Time: 01/03/24 12:06 PM  Result Value Ref Range   Glucose-Capillary 92 70 - 99 mg/dL    Comment: Glucose reference range applies only to samples taken after fasting for at least 8 hours.  Glucose, capillary     Status: None   Collection Time: 01/03/24  4:37 PM  Result Value Ref Range   Glucose-Capillary 78 70 - 99 mg/dL    Comment: Glucose reference range applies only to samples taken after fasting for at least 8 hours.   Comment 1 Notify RN    Comment 2 Document in Chart   Glucose, capillary     Status: Abnormal   Collection Time: 01/03/24  8:08 PM  Result Value Ref Range   Glucose-Capillary 69 (L) 70 - 99  mg/dL    Comment: Glucose reference range applies only to samples taken after fasting for at least 8 hours.  Glucose, capillary     Status: None   Collection Time: 01/04/24 12:13 AM  Result Value Ref Range   Glucose-Capillary 85 70 - 99 mg/dL    Comment: Glucose reference range applies only to samples taken after fasting for at least 8 hours.  CBC     Status: Abnormal   Collection Time: 01/04/24  4:11 AM  Result Value Ref Range   WBC 6.7 4.0 - 10.5 K/uL   RBC 4.11 3.87 - 5.11 MIL/uL   Hemoglobin 12.6 12.0 - 15.0 g/dL   HCT 61.6 63.9 - 53.9 %   MCV 93.2 80.0 - 100.0 fL   MCH 30.7 26.0 - 34.0 pg   MCHC 32.9 30.0 - 36.0 g/dL   RDW 75.7 (H) 88.4 - 84.4 %   Platelets 182 150 - 400 K/uL    Comment: REPEATED TO VERIFY   nRBC 0.0 0.0 - 0.2 %    Comment: Performed at [facility], [address] [place], [place] [address]  Glucose, capillary     Status: None   Collection Time: 01/04/24  7:13 AM  Result Value Ref Range   Glucose-Capillary 88 70 - 99 mg/dL    Comment: Glucose reference range applies only to samples taken after  fasting for at least 8 hours.   Comment 1 Document in Chart   Glucose, capillary     Status: None   Collection Time: 01/04/24  9:58 AM  Result Value Ref Range   Glucose-Capillary 74 70 - 99 mg/dL    Comment: Glucose reference range applies only to samples taken after fasting for at least 8 hours.  Comprehensive metabolic panel     Status: Abnormal   Collection Time: 01/04/24 10:26 AM  Result Value Ref Range   Sodium 136 135 - 145 mmol/L   Potassium 3.8 3.5 - 5.1 mmol/L   Chloride 99 98 - 111 mmol/L   CO2 25 22 - 32 mmol/L   Glucose, Bld 82 70 - 99 mg/dL    Comment: Glucose reference range applies only to samples taken after fasting for at least 8 hours.   BUN 27 (H) 8 - 23 mg/dL   Creatinine, Ser 6.55 (H) 0.44 - 1.00 mg/dL   Calcium  8.5 (L) 8.9 - 10.3 mg/dL   Total Protein 6.3 (L) 6.5 - 8.1 g/dL   Albumin  2.7 (L) 3.5 - 5.0 g/dL   AST 32 15 - 41 U/L   ALT 24  0 - 44 U/L   Alkaline Phosphatase 219 (H) 38 - 126 U/L   Total Bilirubin 2.7 (H) 0.0 - 1.2 mg/dL   GFR, Estimated 13 (L) >60 mL/min    Comment: (NOTE) Calculated using the CKD-EPI Creatinine Equation (2021)    Anion gap 12 5 - 15    Comment: Performed at [facility], [address], [place], [place] [address]  Glucose, capillary     Status: Abnormal   Collection Time: 01/04/24  3:23 PM  Result Value Ref Range   Glucose-Capillary 68 (L) 70 - 99 mg/dL    Comment: Glucose reference range applies only to samples taken after fasting for at least 8 hours.  Glucose, capillary     Status: Abnormal   Collection Time: 01/04/24  3:58 PM  Result Value Ref Range   Glucose-Capillary 166 (H) 70 - 99 mg/dL    Comment: Glucose reference range applies only to samples taken after fasting for at least 8 hours.  Glucose, capillary     Status: Abnormal   Collection Time: 01/04/24  4:59 PM  Result Value Ref Range   Glucose-Capillary 140 (H) 70 - 99 mg/dL    Comment: Glucose reference range applies only to samples taken after fasting for at least 8 hours.   Comment 1 Notify RN    Comment 2 Document in Chart   Glucose, capillary     Status: Abnormal   Collection Time: 01/04/24  7:34 PM  Result Value Ref Range   Glucose-Capillary 135 (H) 70 - 99 mg/dL    Comment: Glucose reference range applies only to samples taken after fasting for at least 8 hours.  Glucose, capillary     Status: Abnormal   Collection Time: 01/05/24  3:10 AM  Result Value Ref Range   Glucose-Capillary 117 (H) 70 - 99 mg/dL    Comment: Glucose reference range applies only to samples taken after fasting for at least 8 hours.  CBC     Status: Abnormal   Collection Time: 01/05/24  3:21 AM  Result Value Ref Range   WBC 7.1 4.0 - 10.5 K/uL   RBC 4.42 3.87 - 5.11 MIL/uL   Hemoglobin 13.6 12.0 - 15.0 g/dL   HCT 58.2 63.9 - 53.9 %   MCV 94.3 80.0 - 100.0 fL  MCH 30.8 26.0 - 34.0 pg   MCHC 32.6 30.0 - 36.0 g/dL   RDW 75.2 (H) 88.4 -  15.5 %   Platelets 179 150 - 400 K/uL   nRBC 0.0 0.0 - 0.2 %    Comment: Performed at [facility], [address], [place], [place] [address]  Comprehensive metabolic panel     Status: Abnormal   Collection Time: 01/05/24  3:21 AM  Result Value Ref Range   Sodium 137 135 - 145 mmol/L   Potassium 4.2 3.5 - 5.1 mmol/L   Chloride 99 98 - 111 mmol/L   CO2 21 (L) 22 - 32 mmol/L   Glucose, Bld 126 (H) 70 - 99 mg/dL    Comment: Glucose reference range applies only to samples taken after fasting for at least 8 hours.   BUN 33 (H) 8 - 23 mg/dL   Creatinine, Ser 5.89 (H) 0.44 - 1.00 mg/dL   Calcium  9.2 8.9 - 10.3 mg/dL   Total Protein 7.3 6.5 - 8.1 g/dL   Albumin  3.2 (L) 3.5 - 5.0 g/dL   AST 38 15 - 41 U/L   ALT 28 0 - 44 U/L   Alkaline Phosphatase 244 (H) 38 - 126 U/L   Total Bilirubin 3.3 (H) 0.0 - 1.2 mg/dL   GFR, Estimated 10 (L) >60 mL/min    Comment: (NOTE) Calculated using the CKD-EPI Creatinine Equation (2021)    Anion gap 17 (H) 5 - 15    Comment: Performed at [facility], [address], [place], [place] [address]  Magnesium      Status: None   Collection Time: 01/05/24  3:21 AM  Result Value Ref Range   Magnesium  1.9 1.7 - 2.4 mg/dL    Comment: Performed at [facility], [address] [place], [place] [address]  Phosphorus     Status: None   Collection Time: 01/05/24  3:21 AM  Result Value Ref Range   Phosphorus 3.4 2.5 - 4.6 mg/dL    Comment: Performed at [facility], [address] [place], [place] [address]  Ammonia     Status: Abnormal   Collection Time: 01/05/24  3:21 AM  Result Value Ref Range   Ammonia 54 (H) 9 - 35 umol/L    Comment: Performed at [facility], [address] [place], [place] [address]  Lactic acid, plasma     Status: Abnormal   Collection Time: 01/05/24  3:21 AM  Result Value Ref Range   Lactic Acid, Venous 3.0 (HH) 0.5 - 1.9 mmol/L    Comment: CRITICAL RESULT CALLED TO, READ BACK BY AND VERIFIED WITH RONAL BENDERS [phone], COLLIN PARAS Performed  at [facility], [address], [place], [place] [address]   Glucose, capillary     Status: Abnormal   Collection Time: 01/05/24  7:09 AM  Result Value Ref Range   Glucose-Capillary 112 (H) 70 - 99 mg/dL    Comment: Glucose reference range applies only to samples taken after fasting for at least 8 hours.   Comment 1 Notify RN    Comment 2 Document in Chart   Bilirubin, fractionated(tot/dir/indir)     Status: Abnormal   Collection Time: 01/05/24 10:00 AM  Result Value Ref Range   Total Bilirubin 3.2 (H) 0.0 - 1.2 mg/dL   Bilirubin, Direct 1.4 (H) 0.0 - 0.2 mg/dL   Indirect Bilirubin 1.8 (H) 0.3 - 0.9 mg/dL    Comment: Performed at [facility], [address], [place], [place] [address]   *Note: Due  to a large number of results and/or encounters for the requested time period, some results have not been displayed. A complete set of results can be found in Results Review.   No results found.  PMH:   Past Medical History:  Diagnosis Date   Acute on chronic respiratory failure with hypoxia (HCC) 10/10/2016   Anxiety    Arthritis    AVM (arteriovenous malformation) of colon    CAD (coronary artery disease)    a. s/p CABG in 2013 b. DES to D1 in 10/2016. c. cath in 07/2018 showing patent grafts with occlusion of D1 at prior stent site and progression of PDA disease --> medical management recommended   Carotid artery disease (HCC)    a. 60-79% LICA, 03/2012    Chronic bronchitis (HCC)    Chronic HFrEF (heart failure with reduced ejection fraction) (HCC)    Colon cancer (HCC) 1992   Esophageal stricture    ESRD on hemodialysis (HCC)    ESRD due to HTN, started dialysis 2011 and gets HD at [facility] with Dr Edwardo on MWF schedule.  Access is LUA AVF as of Sept 2014.    GERD (gastroesophageal reflux disease)    High cholesterol 12/2011   History of blood transfusion 07/2011; 12/2011; 01/2012 X 2; 04/2012   History of gout    History of lower GI bleeding    Hypertension    Iron   deficiency anemia    Jugular vein occlusion, right (HCC)    Mitral regurgitation    a. Moderate by echo, 02/2012   Mitral valve disease    NSVT (nonsustained ventricular tachycardia) (HCC)    Ovarian cancer (HCC) 1992   PAF (paroxysmal atrial fibrillation) (HCC)    Pneumonia ~ 2009   PUD (peptic ulcer disease)    TIA (transient ischemic attack)    Tricuspid valve disease     PSH:   Past Surgical History:  Procedure Laterality Date   A/V FISTULAGRAM Left 10/04/2022   Procedure: A/V Fistulagram;  Surgeon: Jama Cordella MATSU, MD;  Location: [facility] INVASIVE CV LAB;  Service: Cardiovascular;  Laterality: Left;   A/V SHUNTOGRAM Left 03/19/2019   Procedure: A/V SHUNTOGRAM;  Surgeon: Jama Cordella MATSU, MD;  Location: [facility] INVASIVE CV LAB;  Service: Cardiovascular;  Laterality: Left;   ABDOMINAL HYSTERECTOMY  1992   APPENDECTOMY  06/1990   AV FISTULA PLACEMENT  07/2009   left upper arm   AV FISTULA PLACEMENT Right 09/06/2016   Procedure: RIGHT FOREARM ARTERIOVENOUS (AV) GRAFT;  Surgeon: Carlin FORBES Haddock, MD;  Location: [facility] OR;  Service: Vascular;  Laterality: Right;   AV FISTULA PLACEMENT N/A 02/24/2017   Procedure: INSERTION OF ARTERIOVENOUS (AV) GORE-TEX GRAFT ARM (BRACHIAL AXILLARY);  Surgeon: Jama Cordella MATSU, MD;  Location: [facility] ORS;  Service: Vascular;  Laterality: N/A;   AVGG REMOVAL Right 09/06/2016   Procedure: REMOVAL OF Right Arm ARTERIOVENOUS GORETEX GRAFT and Vein Patch angioplasty of brachial artery;  Surgeon: Lonni GORMAN Blade, MD;  Location[facility] OR;  Service: Vascular;  Laterality: Right;   BIOPSY  09/26/2019   Procedure: BIOPSY;  Surgeon: Golda Claudis PENNER, MD;  [facility]: [facility] SUITE;  Service: Endoscopy;;   BIOPSY  11/01/2022   Procedure: BIOPSY;  Surgeon: Eartha Angelia Sieving, MD;  [facility]: [facility];  Service: Gastroenterology;;   COLON RESECTION  1992   COLON SURGERY     COLONOSCOPY N/A 03/09/2019   Procedure: COLONOSCOPY;  Surgeon: Golda Claudis PENNER, MD;   [facility]: [facility];  Service: Endoscopy;  Laterality: N/A;   COLONOSCOPY N/A 08/11/2022   Procedure: COLONOSCOPY;  Surgeon: Albertus Gordy HERO, MD;  Location: [facility] ENDOSCOPY;  Service: Gastroenterology;  Laterality: N/A;   COLONOSCOPY WITH PROPOFOL  N/A 06/08/2021   Procedure: COLONOSCOPY WITH PROPOFOL ;  Surgeon: Eartha Angelia Sieving, MD;  [facility];  Service: Gastroenterology;  Laterality: N/A;  9:05 /Patient is on dialysis Mon Wed Fri   CORONARY ANGIOPLASTY WITH STENT PLACEMENT  12/15/11   2   CORONARY ANGIOPLASTY WITH STENT PLACEMENT  y/2013   1; makes total of 3 (05/02/2012)   CORONARY ARTERY BYPASS GRAFT  06/13/2012   Procedure: CORONARY ARTERY BYPASS GRAFTING (CABG);  Surgeon: Dallas KATHEE Jude, MD;  Location[facility] OR;  Service: Open Heart Surgery;  Laterality: N/A;  cabg x four;  using left internal mammary artery, and left leg greater saphenous vein harvested endoscopically   CORONARY STENT INTERVENTION N/A 10/13/2016   Procedure: Coronary Stent Intervention;  Surgeon: Debby DELENA Sor, MD;  Location: [facility] INVAS[facility] CV LAB;  Service: Cardiovascular;  Laterality: N/A;   DIALYSIS/PERMA CATHETER REMOVAL N/A 04/18/2017   Procedure: DIALYSIS/PERMA CATHETER REMOVAL;  Surgeon: Jama Cordella MATSU, MD;  Location: [facility] INVASIVE CV LAB;  Service: Cardiovascular;  Laterality: N/A;   DILATION AND CURETTAGE OF UTERUS     ENTEROSCOPY N/A 06/08/2021   Procedure: PUSH ENTEROSCOPY;  Surgeon: Eartha Angelia Sieving, MD;  [facility];  Service: Gastroenterology;  Laterality: N/A;   ENTEROSCOPY N/A 11/01/2022   Procedure: ENTEROSCOPY;  Surgeon: Eartha Angelia Sieving, MD;  [facility];  Service: Gastroenterology;  Laterality: N/A;   ENTEROSCOPY N/A 12/01/2022   Procedure: ENTEROSCOPY;  Surgeon: Leigh Elspeth SQUIBB, MD;  Location[facility] ENDOSCOPY;  Service: Gastroenterology;  Laterality: N/A;   ENTEROSCOPY N/A 01/04/2024   Procedure: ENTEROSCOPY;  Surgeon: Cinderella [name],  MD;  [facility];  Service: Endoscopy;  Laterality: N/A;   ESOPHAGOGASTRODUODENOSCOPY  01/20/2012   Procedure: ESOPHAGOGASTRODUODENOSCOPY (EGD);  Surgeon: Gwendlyn ONEIDA Buddy, MD,FACG;  Location: [facility] ENDOSCOPY;  Service: Endoscopy;  Laterality: N/A;   ESOPHAGOGASTRODUODENOSCOPY N/A 03/26/2013   Procedure: ESOPHAGOGASTRODUODENOSCOPY (EGD);  Surgeon: Norleen LOISE Kiang, MD;  Location: [facility] ENDOSCOPY;  Service: Endoscopy;  Laterality: N/A;   ESOPHAGOGASTRODUODENOSCOPY N/A 04/30/2015   Procedure: ESOPHAGOGASTRODUODENOSCOPY (EGD);  Surgeon: Claudis RAYMOND Rivet, MD;  [facility];  Service: Endoscopy;  Laterality: N/A;  1pm - moved to 10/20 @ 1:10   ESOPHAGOGASTRODUODENOSCOPY N/A 07/29/2016   Procedure: ESOPHAGOGASTRODUODENOSCOPY (EGD);  Surgeon: Elspeth Deward Leigh, MD;  Location: [facility] ENDOSCOPY;  Service: Gastroenterology;  Laterality: N/A;  enteroscopy   ESOPHAGOGASTRODUODENOSCOPY N/A 09/26/2019   Procedure: ESOPHAGOGASTRODUODENOSCOPY (EGD);  Surgeon: Rivet Claudis RAYMOND, MD;  [facility];  Service: Endoscopy;  Laterality: N/A;  1250   ESOPHAGOGASTRODUODENOSCOPY N/A 08/11/2022   Procedure: ESOPHAGOGASTRODUODENOSCOPY (EGD);  Surgeon: Albertus Gordy HERO, MD;  Location: [facility] ENDOSCOPY;  Service: Gastroenterology;  Laterality: N/A;   ESOPHAGOGASTRODUODENOSCOPY (EGD) WITH PROPOFOL  N/A 02/05/2021   Procedure: ESOPHAGOGASTRODUODENOSCOPY (EGD) WITH PROPOFOL ;  Surgeon: Cindie Carlin POUR, DO;  [facility];  Service: Endoscopy;  Laterality: N/A;   ESOPHAGOGASTRODUODENOSCOPY (EGD) WITH PROPOFOL  N/A 08/27/2022   Procedure: ESOPHAGOGASTRODUODENOSCOPY (EGD) WITH PROPOFOL ;  Surgeon: Cindie Carlin POUR, DO;  [facility];  Service: Endoscopy;  Laterality: N/A;   GIVENS CAPSULE STUDY N/A 03/07/2019   Procedure: GIVENS CAPSULE STUDY;  Surgeon: Rivet Claudis RAYMOND, MD;  [facility];  Service: Endoscopy;  Laterality: N/A;  7:30   GIVENS CAPSULE STUDY N/A 04/22/2021   Procedure: GIVENS  CAPSULE STUDY;  Surgeon: Golda Claudis PENNER, MD;  [facility];  Service: Endoscopy;  Laterality: N/A;  7:30   GIVENS CAPSULE STUDY N/A 08/27/2022   Procedure: GIVENS CAPSULE STUDY;  Surgeon: Cindie Carlin POUR, DO;  [facility];  Service: Endoscopy;  Laterality: N/A;   HOT HEMOSTASIS N/A 08/11/2022   Procedure: HOT HEMOSTASIS (ARGON PLASMA COAGULATION/BICAP);  Surgeon: Albertus Gordy HERO, MD;  Location: [facility] ENDOSCOPY;  Service: Gastroenterology;  Laterality: N/A;   HOT HEMOSTASIS N/A 12/01/2022   Procedure: HOT HEMOSTASIS (ARGON PLASMA COAGULATION/BICAP);  Surgeon: Leigh Elspeth SQUIBB, MD;  Location: [facility] ENDOSCOPY;  Service: Gastroenterology;  Laterality: N/A;   INSERTION OF DIALYSIS CATHETER N/A 10/05/2020   Procedure: ABORTED TUNNELED DIALYSIS CATHETER PLACEMENT RIGHT INTERNAL JUGULAR VEIN ;  Surgeon: Kallie Manuelita BROCKS, MD;  Location: [facility] ORS;  Service: General;  Laterality: N/A;   INTRAOPERATIVE TRANSESOPHAGEAL ECHOCARDIOGRAM  06/13/2012   Procedure: INTRAOPERATIVE TRANSESOPHAGEAL ECHOCARDIOGRAM;  Surgeon: [place] KATHEE Jude, MD;  Location: [facility] OR;  Service: Open Heart Surgery;  Laterality: N/A;   IR DIALY SHUNT INTRO NEEDLE/INTRACATH INITIAL W/IMG LEFT Left 10/06/2020   IR FLUORO GUIDE CV LINE RIGHT  06/17/2020   IR FLUORO GUIDE CV LINE RIGHT  11/12/2022   IR GENERIC HISTORICAL  07/26/2016   IR FLUORO GUIDE CV LINE RIGHT 07/26/2016 Ozell Specking, MD [facility]   IR GENERIC HISTORICAL  07/26/2016   IR US  GUIDE VASC ACCESS RIGHT 07/26/2016 Ozell Specking, MD [facility]   IR GENERIC HISTORICAL  08/02/2016   IR US  GUIDE VASC ACCESS RIGHT 08/02/2016 Ozell Specking, MD [facility]   IR GENERIC HISTORICAL  08/02/2016   IR FLUORO GUIDE CV LINE RIGHT 08/02/2016 Ozell Specking, MD [facility]   IR RADIOLOGY PERIPHERAL GUIDED IV START  03/28/2017   IR REMOVAL TUN CV CATH W/O FL  08/11/2020   IR REMOVAL TUN CV CATH W/O FL  11/15/2022   IR THROMBECTOMY AV FISTULA W/THROMBOLYSIS INC/SHUNT/IMG LEFT Left  06/17/2020   IR US  GUIDE VASC ACCESS LEFT  06/17/2020   IR US  GUIDE VASC ACCESS RIGHT  03/28/2017   IR US  GUIDE VASC ACCESS RIGHT  06/17/2020   IR US  GUIDE VASC ACCESS RIGHT  11/12/2022   LEFT HEART CATH AND CORONARY ANGIOGRAPHY N/A 09/20/2016   Procedure: Left Heart Cath and Coronary Angiography;  Surgeon: Victory LELON Sharps, MD;  Location: [facility] INVASIVE CV LAB;  Service: Cardiovascular;  Laterality: N/A;   LEFT HEART CATH AND CORS/GRAFTS ANGIOGRAPHY N/A 10/13/2016   Procedure: Left Heart Cath and Cors/Grafts Angiography;  Surgeon: Debby DELENA Sor, MD;  Location: [facility] INVASIVE CV LAB;  Service: Cardiovascular;  Laterality: N/A;   LEFT HEART CATH AND CORS/GRAFTS ANGIOGRAPHY N/A 07/13/2018   Procedure: LEFT HEART CATH AND CORS/GRAFTS ANGIOGRAPHY;  Surgeon: Swaziland, Peter M, MD;  Location: [facility] INVASIVE CV LAB;  Service: Cardiovascular;  Laterality: N/A;   LEFT HEART CATH AND CORS/GRAFTS ANGIOGRAPHY N/A 07/22/2021   Procedure: LEFT HEART CATH AND CORS/GRAFTS ANGIOGRAPHY;  Surgeon: Court Dorn PARAS, MD;  Location: [facility] INVASIVE CV LAB;  Service: Cardiovascular;  Laterality: N/A;   LEFT HEART CATHETERIZATION WITH CORONARY ANGIOGRAM N/A 12/15/2011   Procedure: LEFT HEART CATHETERIZATION WITH CORONARY ANGIOGRAM;  Surgeon: Lonni JONETTA Cash, MD;  Location: [facility] CATH LAB;  Service: Cardiovascular;  Laterality: N/A;   LEFT HEART CATHETERIZATION WITH CORONARY ANGIOGRAM N/A 01/10/2012   Procedure: LEFT HEART CATHETERIZATION WITH CORONARY ANGIOGRAM;  Surgeon: Peter M Swaziland, MD;  Location: [facility] CATH LAB;  Service: Cardiovascular;  Laterality: N/A;  LEFT HEART CATHETERIZATION WITH CORONARY ANGIOGRAM N/A 06/08/2012   Procedure: LEFT HEART CATHETERIZATION WITH CORONARY ANGIOGRAM;  Surgeon: Lonni JONETTA Cash, MD;  Location: [facility] CATH LAB;  Service: Cardiovascular;  Laterality: N/A;   LEFT HEART CATHETERIZATION WITH CORONARY/GRAFT ANGIOGRAM N/A 12/10/2013   Procedure: LEFT HEART CATHETERIZATION WITH EL BILE;  Surgeon:  Candyce GORMAN Reek, MD;  Location: [facility] CATH LAB;  Service: Cardiovascular;  Laterality: N/A;   OVARY SURGERY     ovarian cancer   POLYPECTOMY  03/09/2019   Procedure: POLYPECTOMY;  Surgeon: Golda Claudis PENNER, MD;  Location: [facility] ENDO SUITE;  Service: Endoscopy;;  cecal    POLYPECTOMY N/A 09/26/2019   Procedure: DUODENAL POLYPECTOMY;  Surgeon: Golda Claudis PENNER, MD;  Location: [facility] ENDO SUITE;  Service: Endoscopy;  Laterality: N/A;   POLYPECTOMY  08/11/2022   Procedure: POLYPECTOMY;  Surgeon: Albertus Gordy HERO, MD;  Location: [facility] ENDOSCOPY;  Service: Gastroenterology;;   REVISION OF ARTERIOVENOUS GORETEX GRAFT N/A 02/24/2017   Procedure: REVISION OF ARTERIOVENOUS GORETEX GRAFT (RESECTION);  Surgeon: Jama Cordella MATSU, MD;  Location: [facility] ORS;  Service: Vascular;  Laterality: N/A;   REVISON OF ARTERIOVENOUS FISTULA Left 06/19/2020   Procedure: REVISION OF LEFT UPPER ARM AV GRAFT WITH INTERPOSITION JUMP GRAFT USING GORE LIMB;  Surgeon: Gretta Lonni PARAS, MD;  Location: [facility] OR;  Service: Vascular;  Laterality: Left;   SHUNTOGRAM N/A 10/15/2013   Procedure: Fistulogram;  Surgeon: Gaile LELON New, MD;  Location: [facility] CATH LAB;  Service: Cardiovascular;  Laterality: N/A;   SUBMUCOSAL TATTOO INJECTION  11/01/2022   Procedure: SUBMUCOSAL TATTOO INJECTION;  Surgeon: Eartha Angelia Sieving, MD;  Location: [facility] ENDO SUITE;  Service: Gastroenterology;;   THROMBECTOMY / ARTERIOVENOUS GRAFT REVISION  2011   left upper arm   TUBAL LIGATION  1980's   UPPER EXTREMITY ANGIOGRAPHY Bilateral 12/06/2016   Procedure: Upper Extremity Angiography;  Surgeon: Jama Cordella MATSU, MD;  Location: [facility] INVASIVE CV LAB;  Service: Cardiovascular;  Laterality: Bilateral;   UPPER EXTREMITY INTERVENTION Left 06/06/2017   Procedure: UPPER EXTREMITY INTERVENTION;  Surgeon: Jama Cordella MATSU, MD;  Location: [facility] INVASIVE CV LAB;  Service: Cardiovascular;  Laterality: Left;   UPPER EXTREMITY VENOGRAPHY Left 10/04/2022   Procedure: UPPER  EXTREMITY VENOGRAPHY;  Surgeon: Jama Cordella MATSU, MD;  Location: [facility] INVASIVE CV LAB;  Service: Cardiovascular;  Laterality: Left;    Allergies:  Allergies  Allergen Reactions   Amlodipine  Swelling   Aspirin  Other (See Comments)    High Doses Mess up her stomach; makes my bowels have blood in them. Takes 81 mg EC Aspirin     Nitrofurantoin Hives   Ranexa  [Ranolazine ] Other (See Comments)    Myoclonus-hospitalized    Bactrim [Sulfamethoxazole-Trimethoprim] Rash   Gabapentin  Other (See Comments)    Unknown reaction   Iodinated Contrast Media Itching and Other (See Comments)   Iron  Itching and Other (See Comments)    they gave me iron  in dialysis; had to give me Benadryl  cause I had to have the iron  (05/02/2012)   Sucroferric Oxyhydroxide Other (See Comments)    Unknown   Tylenol  [Acetaminophen ] Itching and Other (See Comments)    Makes her feet on fire per pt - can tolerate per pt   Levaquin  [Levofloxacin  In D5w] Rash   Levofloxacin  Rash   Plavix  [Clopidogrel  Bisulfate] Rash   Protonix  [Pantoprazole  Sodium] Rash   Venofer [Ferric Oxide] Itching and Other (See Comments)    Patient reports using Benadryl  prior to doses as [facility]    Medications:  Prior to Admission medications   Medication Sig Start Date End Date Taking? Authorizing Provider  albuterol  (PROVENTIL ) (2.5 MG/3ML) 0.083% nebulizer solution USE 1 VIAL IN NEBULIZER 4 TIMES DAILY AS NEEDED FOR COUGH, WHEEZING, AND SHORTNESS OF BREATH 10/20/23  Yes [provider]  albuterol  (VENTOLIN  HFA) 108 (90 Base) MCG/ACT inhaler SMARTSIG:2 Puff(s) By Mouth Every 4-6 Hours PRN 10/10/23  Yes [provider]  benzonatate  (TESSALON ) 100 MG capsule Take 1 capsule (100 mg total) by mouth 2 (two) times daily as needed for cough. 11/05/23  Yes Johnson, Clanford L, MD  calcitRIOL  (ROCALTROL ) 0.25 MCG capsule Take 7 capsules (1.75 mcg total) by mouth every Monday, Wednesday, and Friday with hemodialysis. 12/21/22  Yes  Christobal Guadalajara, MD  cinacalcet  (SENSIPAR ) 30 MG tablet Take 1 tablet (30 mg total) by mouth every Monday, Wednesday, and Friday with hemodialysis. 12/19/22  Yes Christobal Guadalajara, MD  cyanocobalamin  (VITAMIN B12) 1000 MCG/ML injection Inject 1,000 mcg into the muscle every 30 (thirty) days.   Yes [provider]  famotidine  (PEPCID ) 20 MG tablet Take 1 tablet (20 mg total) by mouth daily as needed for heartburn or indigestion. 09/10/23 09/09/24 Yes Johnson, Clanford L, MD  folic acid  (FOLVITE ) 1 MG tablet Take 1 mg by mouth daily. 05/24/22  Yes [provider]  guaifenesin  (ROBITUSSIN) 100 MG/5ML syrup Take 200 mg by mouth 3 (three) times daily as needed for cough.   Yes [provider]  hydrOXYzine  (ATARAX ) 25 MG tablet Take 25 mg by mouth every 6 (six) hours as needed for itching. 06/06/22  Yes [provider]  isosorbide  mononitrate (IMDUR ) 30 MG 24 hr tablet Take 1 tablet (30 mg total) by mouth daily. 11/15/23  Yes Cindy Garnette POUR, MD  levETIRAcetam  (KEPPRA ) 500 MG tablet Take 1 tablet (500 mg total) by mouth daily. 11/15/22  Yes Singh, Prashant K, MD  levothyroxine  (SYNTHROID ) 50 MCG tablet Take 50 mcg by mouth daily. 08/27/23  Yes [provider]  metoprolol  succinate (TOPROL  XL) 25 MG 24 hr tablet Take 1 tablet (25 mg total) by mouth 2 (two) times daily. Take an extra 25 mg for heart rate over 100 11/30/23  Yes Furth, Cadence H, PA-C  multivitamin (RENA-VIT) TABS tablet Take 1 tablet by mouth daily.   Yes [provider]  nitroGLYCERIN  (NITROSTAT ) 0.4 MG SL tablet Place 0.4 mg under the tongue every 5 (five) minutes as needed for chest pain.   Yes [provider]  octreotide  (SANDOSTATIN  LAR) 20 MG injection Inject 20 mg into the muscle every 28 (twenty-eight) days. 11/15/22  Yes Singh, Prashant K, MD  ondansetron  (ZOFRAN -ODT) 4 MG disintegrating tablet Take 4 mg by mouth every 8 (eight) hours as needed for nausea or vomiting. 05/22/23  Yes [provider]  rosuvastatin  (CRESTOR ) 10 MG tablet Take 1 tablet by mouth once daily 06/06/22  Yes Strader, Brittany M, PA-C    Discontinued Meds:   Medications Discontinued During This Encounter  Medication Reason   calcitRIOL  (ROCALTROL ) capsule 2 mcg    ondansetron  (ZOFRAN ) injection 4 mg    lactulose (CHRONULAC) enema 200 gm    lactulose (CHRONULAC) enema 200 gm    multivitamin (RENA-VIT) TABS tablet    folic acid  (FOLVITE ) 1 MG tablet    hydrOXYzine  (ATARAX ) 25 MG tablet    levETIRAcetam  (KEPPRA ) 500 MG tablet    octreotide  (SANDOSTATIN  LAR) 20 MG injection    cyanocobalamin  (VITAMIN B12) 1000 MCG/ML injection    levothyroxine  (SYNTHROID ) 50 MCG tablet  famotidine  (PEPCID ) 20 MG tablet    albuterol  (PROVENTIL ) (2.5 MG/3ML) 0.083% nebulizer solution    albuterol  (VENTOLIN  HFA) 108 (90 Base) MCG/ACT inhaler    isosorbide  mononitrate (IMDUR ) 30 MG 24 hr tablet    metoprolol  succinate (TOPROL  XL) 25 MG 24 hr tablet     Social History:  reports that she has never smoked. She has never used smokeless tobacco. She reports that she does not drink alcohol  and does not use drugs.  Family History:   Family History  Problem Relation Age of Onset   Heart disease Mother        Heart Disease before age 68   Hyperlipidemia Mother    Hypertension Mother    Diabetes Mother    Heart attack Mother    Heart disease Father        Heart Disease before age 40   Hyperlipidemia Father    Hypertension Father    Diabetes Father    Diabetes Sister    Hypertension Sister    Diabetes Brother    Hyperlipidemia Brother    Heart attack Brother    Hypertension Sister    Heart attack Brother    Colon cancer Child 63   Other Other        noncontributory for early CAD   Esophageal cancer Neg Hx    Liver disease Neg Hx    Kidney disease Neg Hx    Colon polyps Neg Hx     Blood pressure (!) 140/92, pulse (!) 126, temperature 97.6 F (36.4 C), temperature source Axillary, resp. rate (!) 37,  height 5' 1 (1.549 m), weight 48.6 kg, SpO2 100%. Physical Exam: GEN: NAD, altered mental status but pleasant, frail emaciated HEENT: EOMI NECK: Supple, no thyromegaly LUNGS: CTA B/L no rales, rhonchi or wheezing CV: RRR, No M/R/G ABD: SNDNT +BS  EXT: No lower extremity edema ACCESS: Left IJ tunneled catheter, left upper arm AV graft clotted      Nalina Yeatman, LYNWOOD ORN, MD 01/05/2024, 11:00 AM

## 2024-01-05 NOTE — Anesthesia Postprocedure Evaluation (Signed)
 Anesthesia Post Note  Patient: Shawna [name]  Procedure(s) Performed: ENTEROSCOPY  Patient location during evaluation: Phase II Anesthesia Type: General Level of consciousness: awake Pain management: pain level controlled Vital Signs Assessment: post-procedure vital signs reviewed and stable Respiratory status: spontaneous breathing and respiratory function stable Cardiovascular status: blood pressure returned to baseline and stable Postop Assessment: no headache and no apparent nausea or vomiting Anesthetic complications: no Comments: Late entry   No notable events documented.   Last Vitals:  Vitals:   01/05/24 0252 01/05/24 0537  BP: (!) 133/102 (!) 139/103  Pulse: (!) 123 (!) 122  Resp: 14 15  Temp: 36.5 C 37.1 C  SpO2: 98% 100%    Last Pain:  Vitals:   01/05/24 0537  TempSrc: Oral  PainSc:                  [name]

## 2024-01-05 NOTE — Progress Notes (Addendum)
 Cut off 40 mins per Dr. Melia ordered d/t  Patient confused. Treatment was discontinued. BP 131/90 HR 105.R 20   01/05/24 1156  Vitals  Temp 97.6 F (36.4 C)  Temp Source Axillary  BP Location Right Arm  BP Method Automatic  Patient Position (if appropriate) Lying  Pulse Rate (!) 108  Oxygen  Therapy  SpO2 100 %  O2 Device Room Air  During Treatment Monitoring  Intra-Hemodialysis Comments See progress note  Post Treatment  Dialyzer Clearance Lightly streaked  Hemodialysis Intake (mL) 0 mL  Liters Processed 50  Fluid Removed (mL) 700 mL  Tolerated HD Treatment Yes  Post-Hemodialysis Comments see notes.  Hemodialysis Catheter Left Subclavian Double lumen Temporary (Non-Tunneled)  Placement Date: 06/09/22   Placed prior to admission: Yes  Orientation: Left  Access Location: Subclavian  Hemodialysis Catheter Type: Double lumen Temporary (Non-Tunneled)  Site Condition No complications  Blue Lumen Status Heparin  locked  Red Lumen Status Heparin  locked  Catheter fill solution Heparin  1000 units/ml  Catheter fill volume (Arterial) 1.9 cc  Catheter fill volume (Venous) 1.9  Dressing Type Gauze/Drain sponge;Transparent  Dressing Status Antimicrobial disc/dressing in place  Interventions New dressing  Drainage Description None  Dressing Change Due 01/10/24  Post treatment catheter status Capped and Clamped

## 2024-01-05 NOTE — Progress Notes (Signed)
 Has been too drowsy today to take medications or sips of water .  Continued to be drowsy during dialysis per dialysis nurse.  Red mews due to heart rate and blood pressure. Contacted Dr. KYM Carwin with with information.SABRA  Spoke with husband and he will be there around 1630 to transport home.

## 2024-01-05 NOTE — Progress Notes (Addendum)
 Gastroenterology Progress Note   Referring Provider: No ref. provider found Primary Care Physician:  [facility] Primary Gastroenterologist:  Toribio Rubins, MD  Patient ID: Shawna [name]; [id number]; 1940-09-25   Subjective   Confused. Not answering any questions. Observed in dialysis. Mostly moaning and trying to sit up frequently.   Objective   Vital signs in last 24 hours Temp:  [97.4 F (36.3 C)-98.7 F (37.1 C)] 97.6 F (36.4 C) (06/27 0904) Pulse Rate:  [58-139] 132 (06/27 1130) Resp:  [14-37] 19 (06/27 1130) BP: (111-199)/(54-107) 123/105 (06/27 1130) SpO2:  [97 %-100 %] 100 % (06/27 0904) Weight:  [48.6 kg-49.1 kg] 48.6 kg (06/27 0903) Last BM Date : 01/03/24  Physical Exam General:   Lethargic, NAD.  Head:  Normocephalic and atraumatic. Eyes:  No icterus, sclera clear. Conjuctiva pink.   Abdomen:  Bowel sounds present, soft, non-tender, non-distended. No HSM or hernias noted. No rebound or guarding. No masses appreciated  Neurologic:  Lethargic. Disoriented - not answering questions. Skin:  Warm and dry, intact without significant lesions.  Psych: lethargic, non cooperative.   Intake/Output from previous day: 06/26 0701 - 06/27 0700 In: 340 [P.O.:240; I.V.:100] Out: -  Intake/Output this shift: No intake/output data recorded.  Lab Results  Recent Labs    01/03/24 0423 01/04/24 0411 01/05/24 0321  WBC 5.7 6.7 7.1  HGB 13.3 12.6 13.6  HCT 39.6 38.3 41.7  PLT 162 182 179   BMET Recent Labs    01/03/24 0423 01/04/24 1026 01/05/24 0321  NA 133* 136 137  K 3.8 3.8 4.2  CL 99 99 99  CO2 22 25 21*  GLUCOSE 83 82 126*  BUN 40* 27* 33*  CREATININE 4.02* 3.44* 4.10*  CALCIUM  8.5* 8.5* 9.2   LFT Recent Labs    01/03/24 0423 01/04/24 1026 01/05/24 0321 01/05/24 1000  PROT  --  6.3* 7.3  --   ALBUMIN  3.1* 2.7* 3.2*  --   AST  --  32 38  --   ALT  --  24 28  --   ALKPHOS  --  219* 244*  --   BILITOT  --  2.7* 3.3*  3.2*  BILIDIR  --   --   --  1.4*  IBILI  --   --   --  1.8*   PT/INR No results for input(s): LABPROT, INR in the last 72 hours. Hepatitis Panel Recent Labs    01/03/24 0423  HEPBSAG NON REACTIVE    Studies/Results No results found.  Assessment  83 y.o. female with complex medical history including ESRD on HD (MWF) with recurrent GI bleeding secondary to gastric and small bowel AVMs, HF, CAD/CABG, PAF (not on [facility]) who presented to the ED after somnolence at dialysis with hypoglycemia and anemia with hgb 6.9. GI consulted for further evaluation.   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 No active bleeding.  - Has had frequent drops in hgb over the last several months requiring intermittent transfusions - Husband unclear about any missed doses of octreotide .  - Enteroscopy 6/26: Normal esophagus, gastritis, 6 nonbleeding angioectasias s/p APC and nonbleeding jejunal diverticulum. - Need to continue to monitor, hgb stable at this point in the 12-13 range.  Plan / Recommendations  PPI BID Continue Octreotide  20 mg IM monthly injections. Need to ensure she is set back up with this at infusion center  Renal diet Monitor for overt bleeding CBC 1 week post discharge Follow up OV in 2-3 weeks.  Update iron  profile outpatient in about 1 month.     LOS: 0 days   01/05/2024, 11:34 AM  Charmaine Melia, MSN, FNP-BC, AGACNP-BC [facility]   Gastroenterology attending attestation note: Agree with the below findings as written. The patient was presented to me by the APP (Advanced Practice Provider) I personally reviewed the medical chart and evaluated the patient. I personally evaluated and examined the  patient by myself.   I agree with [name]'s H/P and assessment.  Patient is somnolent and poorly responsive.  Has not presented any melena or hematochezia. Underwent push enteroscopy yesterday and had ablation of some AVMs in the proximal jejunum.  Hemoglobin has remained stable after transfusion. She will need to continue with octreotide  intramuscular injections every 4 weeks.  Will need to confirm if she is indeed receiving these at her PCPs office or if she will need to be set in the infusion center (main limitation is transportation).  Unfortunately, she will tend to have more of these AVMs showing up given her end-stage renal disease status..  Will need to consider evaluation by palliative care given her multiple hospitalizations and progression of dementia and comorbidities.  GI service will sign-off, please call us  back if you have any more questions.  Toribio Fortune, MD Gastroenterology and Hepatology [facility]

## 2024-01-05 NOTE — Progress Notes (Signed)
   01/05/24 0537  Assess: MEWS Score  Temp 98.7 F (37.1 C)  BP (!) 139/103  MAP (mmHg) 115  Pulse Rate (!) 122  Resp 15  SpO2 100 %  O2 Device Room Air  Assess: MEWS Score  MEWS Temp 0  MEWS Systolic 0  MEWS Pulse 2  MEWS RR 0  MEWS LOC 0  MEWS Score 2  MEWS Score Color Yellow  Assess: if the MEWS score is Yellow or Red  Were vital signs accurate and taken at a resting state? Yes  Does the patient meet 2 or more of the SIRS criteria? No  MEWS guidelines implemented  No, previously yellow, continue vital signs every 4 hours  Assess: SIRS CRITERIA  SIRS Temperature  0  SIRS Respirations  0  SIRS Pulse 1  SIRS WBC 0  SIRS Score Sum  1

## 2024-01-05 NOTE — Progress Notes (Signed)
 Patient refusing all morning PO meds, educated on the importance of taking meds. Dr. Adefeso made aware.

## 2024-01-05 NOTE — Discharge Summary (Incomplete)
 Shawna [name], is a 83 y.o. female  DOB Oct 10, 1940  MRN [id number].  Admission date:  01/01/2024  Admitting Physician  Evalene GORMAN Sprinkles, MD  Discharge Date:  01/05/2024   Primary MD  [facility]  Recommendations for primary care physician for things to follow:  1)Very Low-salt diet advised---Less than 2 gm of Sodium per day advised----ok to use Mrs DASH salt substitute instead of Salt 2)Weigh yourself daily, call if you gain more than 3 pounds in 1 day or more than 5 pounds in 1 week as your diuretic medications may need to be adjusted 3)Limit your Fluid  intake to no more than 60 ounces (1.8 Liters) per day 4)Avoid ibuprofen /Advil /Aleve/Motrin Josefine Powders/Naproxen/BC powders/Meloxicam/Diclofenac /Indomethacin and other Nonsteroidal anti-inflammatory medications as these will make you more likely to bleed and can cause stomach ulcers, can also cause Kidney problems.  5)Continue hemodialysis on Mondays Wednesdays and Fridays 6)Repeat CBC blood test in 1 week  Admission Diagnosis  Hypokalemia [E87.6] Symptomatic anemia [D64.9]   Discharge Diagnosis  Hypokalemia [E87.6] Symptomatic anemia [D64.9]    Principal Problem:   Symptomatic anemia Active Problems:   Paroxysmal atrial fibrillation (HCC)   ESRD on hemodialysis (HCC)   CAD (coronary artery disease)   Hypokalemia   History of arteriovenous malformation (AVM)   Hypoglycemia associated with type 2 diabetes mellitus (HCC)   Angiectasia of gastrointestinal tract      Past Medical History:  Diagnosis Date   Acute on chronic respiratory failure with hypoxia (HCC) 10/10/2016   Anxiety    Arthritis    AVM (arteriovenous malformation) of colon    CAD (coronary artery disease)    a. s/p CABG in 2013 b. DES to D1 in 10/2016. c. cath in 07/2018 showing patent grafts with occlusion of D1 at prior stent site and progression of PDA disease -->  medical management recommended   Carotid artery disease (HCC)    a. 60-79% LICA, 03/2012    Chronic bronchitis (HCC)    Chronic HFrEF (heart failure with reduced ejection fraction) (HCC)    Colon cancer (HCC) 1992   Esophageal stricture    ESRD on hemodialysis (HCC)    ESRD due to HTN, started dialysis 2011 and gets HD [facility] with Dr Edwardo on MWF schedule.  Access is LUA AVF as of Sept 2014.    GERD (gastroesophageal reflux disease)    High cholesterol 12/2011   History of blood transfusion 07/2011; 12/2011; 01/2012 X 2; 04/2012   History of gout    History of lower GI bleeding    Hypertension    Iron  deficiency anemia    Jugular vein occlusion, right (HCC)    Mitral regurgitation    a. Moderate by echo, 02/2012   Mitral valve disease    NSVT (nonsustained ventricular tachycardia) (HCC)    Ovarian cancer (HCC) 1992   PAF (paroxysmal atrial fibrillation) (HCC)    Pneumonia ~ 2009   PUD (peptic ulcer disease)    TIA (transient ischemic attack)    Tricuspid valve disease  Past Surgical History:  Procedure Laterality Date   A/V FISTULAGRAM Left 10/04/2022   Procedure: A/V Fistulagram;  Surgeon: Jama Cordella MATSU, MD;  Location: [facility] INVASIVE CV LAB;  Service: Cardiovascular;  Laterality: Left;   A/V SHUNTOGRAM Left 03/19/2019   Procedure: A/V SHUNTOGRAM;  Surgeon: Jama Cordella MATSU, MD;  Location: [facility] INVASIVE CV LAB;  Service: Cardiovascular;  Laterality: Left;   ABDOMINAL HYSTERECTOMY  1992   APPENDECTOMY  06/1990   AV FISTULA PLACEMENT  07/2009   left upper arm   AV FISTULA PLACEMENT Right 09/06/2016   Procedure: RIGHT FOREARM ARTERIOVENOUS (AV) GRAFT;  Surgeon: Carlin FORBES Haddock, MD;  [facility]: [facility] OR;  Service: Vascular;  Laterality: Right;   AV FISTULA PLACEMENT N/A 02/24/2017   Procedure: INSERTION OF ARTERIOVENOUS (AV) GORE-TEX GRAFT ARM (BRACHIAL AXILLARY);  Surgeon: Jama Cordella MATSU, MD;  Location: [facility] ORS;  Service: Vascular;  Laterality: N/A;   AVGG REMOVAL  Right 09/06/2016   Procedure: REMOVAL OF Right Arm ARTERIOVENOUS GORETEX GRAFT and Vein Patch angioplasty of brachial artery;  Surgeon: Lonni GORMAN Blade, MD;  Location: [facility] OR;  Service: Vascular;  Laterality: Right;   BIOPSY  09/26/2019   Procedure: BIOPSY;  Surgeon: Golda Claudis PENNER, MD;  [facility];  Service: Endoscopy;;   BIOPSY  11/01/2022   Procedure: BIOPSY;  Surgeon: Eartha Angelia Sieving, MD;  [facility];  Service: Gastroenterology;;   COLON RESECTION  1992   COLON SURGERY     COLONOSCOPY N/A 03/09/2019   Procedure: COLONOSCOPY;  Surgeon: Golda Claudis PENNER, MD;  [facility];  Service: Endoscopy;  Laterality: N/A;   COLONOSCOPY N/A 08/11/2022   Procedure: COLONOSCOPY;  Surgeon: Albertus Gordy HERO, MD;  Location: [facility] ENDOSCOPY;  Service: Gastroenterology;  Laterality: N/A;   COLONOSCOPY WITH PROPOFOL  N/A 06/08/2021   Procedure: COLONOSCOPY WITH PROPOFOL ;  Surgeon: Eartha Angelia Sieving, MD;  [facility];  Service: Gastroenterology;  Laterality: N/A;  9:05 /Patient is on dialysis Mon Wed Fri   CORONARY ANGIOPLASTY WITH STENT PLACEMENT  12/15/11   2   CORONARY ANGIOPLASTY WITH STENT PLACEMENT  y/2013   1; makes total of 3 (05/02/2012)   CORONARY ARTERY BYPASS GRAFT  06/13/2012   Procedure: CORONARY ARTERY BYPASS GRAFTING (CABG);  Surgeon: Dallas KATHEE Jude, MD;  Location: [facility] OR;  Service: Open Heart Surgery;  Laterality: N/A;  cabg x four;  using left internal mammary artery, and left leg greater saphenous vein harvested endoscopically   CORONARY STENT INTERVENTION N/A 10/13/2016   Procedure: Coronary Stent Intervention;  Surgeon: Debby DELENA Sor, MD;  [facility];  Service: Cardiovascular;  Laterality: N/A;   DIALYSIS/PERMA CATHETER REMOVAL N/A 04/18/2017   Procedure: DIALYSIS/PERMA CATHETER REMOVAL;  Surgeon: Jama Cordella MATSU, MD;  Location: [facility] INVASIVE CV LAB;  Service: Cardiovascular;  Laterality: N/A;   DILATION AND  CURETTAGE OF UTERUS     ENTEROSCOPY N/A 06/08/2021   Procedure: PUSH ENTEROSCOPY;  Surgeon: Eartha Angelia Sieving, MD;  [facility];  Service: Gastroenterology;  Laterality: N/A;   ENTEROSCOPY N/A 11/01/2022   Procedure: ENTEROSCOPY;  Surgeon: Eartha Angelia Sieving, MD;  [facility];  Service: Gastroenterology;  Laterality: N/A;   ENTEROSCOPY N/A 12/01/2022   Procedure: ENTEROSCOPY;  Surgeon: Leigh Elspeth SQUIBB, MD;  Location: [facility] ENDOSCOPY;  Service: Gastroenterology;  Laterality: N/A;   ENTEROSCOPY N/A 01/04/2024   Procedure: ENTEROSCOPY;  Surgeon: Cinderella [name], MD;  [facility];  Service: Endoscopy;  Laterality: N/A;  ESOPHAGOGASTRODUODENOSCOPY  01/20/2012   Procedure: ESOPHAGOGASTRODUODENOSCOPY (EGD);  Surgeon: Gwendlyn ONEIDA Buddy, MD,FACG;  Location: [facility] ENDOSCOPY;  Service: Endoscopy;  Laterality: N/A;   ESOPHAGOGASTRODUODENOSCOPY N/A 03/26/2013   Procedure: ESOPHAGOGASTRODUODENOSCOPY (EGD);  Surgeon: Norleen LOISE Kiang, MD;  Location: [facility] ENDOSCOPY;  Service: Endoscopy;  Laterality: N/A;   ESOPHAGOGASTRODUODENOSCOPY N/A 04/30/2015   Procedure: ESOPHAGOGASTRODUODENOSCOPY (EGD);  Surgeon: Claudis RAYMOND Rivet, MD;  [facility];  Service: Endoscopy;  Laterality: N/A;  1pm - moved to 10/20 @ 1:10   ESOPHAGOGASTRODUODENOSCOPY N/A 07/29/2016   Procedure: ESOPHAGOGASTRODUODENOSCOPY (EGD);  Surgeon: Elspeth Deward Naval, MD;  Location: [facility] ENDOSCOPY;  Service: Gastroenterology;  Laterality: N/A;  enteroscopy   ESOPHAGOGASTRODUODENOSCOPY N/A 09/26/2019   Procedure: ESOPHAGOGASTRODUODENOSCOPY (EGD);  Surgeon: Rivet Claudis RAYMOND, MD;  [facility];  Service: Endoscopy;  Laterality: N/A;  1250   ESOPHAGOGASTRODUODENOSCOPY N/A 08/11/2022   Procedure: ESOPHAGOGASTRODUODENOSCOPY (EGD);  Surgeon: Albertus Gordy HERO, MD;  Location: [facility] ENDOSCOPY;  Service: Gastroenterology;  Laterality: N/A;   ESOPHAGOGASTRODUODENOSCOPY (EGD) WITH PROPOFOL  N/A 02/05/2021    Procedure: ESOPHAGOGASTRODUODENOSCOPY (EGD) WITH PROPOFOL ;  Surgeon: Cindie Carlin POUR, DO;  [facility];  Service: Endoscopy;  Laterality: N/A;   ESOPHAGOGASTRODUODENOSCOPY (EGD) WITH PROPOFOL  N/A 08/27/2022   Procedure: ESOPHAGOGASTRODUODENOSCOPY (EGD) WITH PROPOFOL ;  Surgeon: Cindie Carlin POUR, DO;  [facility];  Service: Endoscopy;  Laterality: N/A;   GIVENS CAPSULE STUDY N/A 03/07/2019   Procedure: GIVENS CAPSULE STUDY;  Surgeon: Rivet Claudis RAYMOND, MD;  [facility];  Service: Endoscopy;  Laterality: N/A;  7:30   GIVENS CAPSULE STUDY N/A 04/22/2021   Procedure: GIVENS CAPSULE STUDY;  Surgeon: Rivet Claudis RAYMOND, MD;  [facility];  Service: Endoscopy;  Laterality: N/A;  7:30   GIVENS CAPSULE STUDY N/A 08/27/2022   Procedure: GIVENS CAPSULE STUDY;  Surgeon: Cindie Carlin POUR, DO;  [facility];  Service: Endoscopy;  Laterality: N/A;   HOT HEMOSTASIS N/A 08/11/2022   Procedure: HOT HEMOSTASIS (ARGON PLASMA COAGULATION/BICAP);  Surgeon: Albertus Gordy HERO, MD;  Location: [facility] ENDOSCOPY;  Service: Gastroenterology;  Laterality: N/A;   HOT HEMOSTASIS N/A 12/01/2022   Procedure: HOT HEMOSTASIS (ARGON PLASMA COAGULATION/BICAP);  Surgeon: Naval Elspeth SQUIBB, MD;  Location: [facility] ENDOSCOPY;  Service: Gastroenterology;  Laterality: N/A;   INSERTION OF DIALYSIS CATHETER N/A 10/05/2020   Procedure: ABORTED TUNNELED DIALYSIS CATHETER PLACEMENT RIGHT INTERNAL JUGULAR VEIN ;  Surgeon: Kallie Manuelita BROCKS, MD;  Location: [facility];  Service: General;  Laterality: N/A;   INTRAOPERATIVE TRANSESOPHAGEAL ECHOCARDIOGRAM  06/13/2012   Procedure: INTRAOPERATIVE TRANSESOPHAGEAL ECHOCARDIOGRAM;  Surgeon: Dallas KATHEE Jude, MD;  Location: [facility] OR;  Service: Open Heart Surgery;  Laterality: N/A;   IR DIALY SHUNT INTRO NEEDLE/INTRACATH INITIAL W/IMG LEFT Left 10/06/2020   IR FLUORO GUIDE CV LINE RIGHT  06/17/2020   IR FLUORO GUIDE CV LINE RIGHT  11/12/2022   IR GENERIC HISTORICAL  07/26/2016    IR FLUORO GUIDE CV LINE RIGHT 07/26/2016 Ozell Specking, MD [facility]   IR GENERIC HISTORICAL  07/26/2016   IR US  GUIDE VASC ACCESS RIGHT 07/26/2016 Ozell Specking, MD [facility]   IR GENERIC HISTORICAL  08/02/2016   IR US  GUIDE VASC ACCESS RIGHT 08/02/2016 Ozell Specking, MD [facility]   IR GENERIC HISTORICAL  08/02/2016   IR FLUORO GUIDE CV LINE RIGHT 08/02/2016 Ozell Specking, MD [facility]   IR RADIOLOGY PERIPHERAL GUIDED IV START  03/28/2017   IR REMOVAL TUN CV CATH W/O FL  08/11/2020   IR REMOVAL TUN CV CATH  W/O FL  11/15/2022   IR THROMBECTOMY AV FISTULA W/THROMBOLYSIS INC/SHUNT/IMG LEFT Left 06/17/2020   IR US  GUIDE VASC ACCESS LEFT  06/17/2020   IR US  GUIDE VASC ACCESS RIGHT  03/28/2017   IR US  GUIDE VASC ACCESS RIGHT  06/17/2020   IR US  GUIDE VASC ACCESS RIGHT  11/12/2022   LEFT HEART CATH AND CORONARY ANGIOGRAPHY N/A 09/20/2016   Procedure: Left Heart Cath and Coronary Angiography;  Surgeon: Victory LELON Sharps, MD;  Location: [facility] INVASIVE CV LAB;  Service: Cardiovascular;  Laterality: N/A;   LEFT HEART CATH AND CORS/GRAFTS ANGIOGRAPHY N/A 10/13/2016   Procedure: Left Heart Cath and Cors/Grafts Angiography;  Surgeon: Debby DELENA Sor, MD;  [facility];  Service: Cardiovascular;  Laterality: N/A;   LEFT HEART CATH AND CORS/GRAFTS ANGIOGRAPHY N/A 07/13/2018   Procedure: LEFT HEART CATH AND CORS/GRAFTS ANGIOGRAPHY;  Surgeon: Swaziland, Peter M, MD;  Location: [facility] INVASIVE CV LAB;  Service: Cardiovascular;  Laterality: N/A;   LEFT HEART CATH AND CORS/GRAFTS ANGIOGRAPHY N/A 07/22/2021   Procedure: LEFT HEART CATH AND CORS/GRAFTS ANGIOGRAPHY;  Surgeon: Court Dorn PARAS, MD;  [facility];  Service: Cardiovascular;  Laterality: N/A;   LEFT HEART CATHETERIZATION WITH CORONARY ANGIOGRAM N/A 12/15/2011   Procedure: LEFT HEART CATHETERIZATION WITH CORONARY ANGIOGRAM;  Surgeon: Lonni JONETTA Cash, MD;  Location: [facility] CATH LAB;  Service: Cardiovascular;  Laterality: N/A;   LEFT HEART  CATHETERIZATION WITH CORONARY ANGIOGRAM N/A 01/10/2012   Procedure: LEFT HEART CATHETERIZATION WITH CORONARY ANGIOGRAM;  Surgeon: Peter M Swaziland, MD;  Location: [facility] CATH LAB;  Service: Cardiovascular;  Laterality: N/A;   LEFT HEART CATHETERIZATION WITH CORONARY ANGIOGRAM N/A 06/08/2012   Procedure: LEFT HEART CATHETERIZATION WITH CORONARY ANGIOGRAM;  Surgeon: Lonni JONETTA Cash, MD;  Location: [facility] CATH LAB;  Service: Cardiovascular;  Laterality: N/A;   LEFT HEART CATHETERIZATION WITH CORONARY/GRAFT ANGIOGRAM N/A 12/10/2013   Procedure: LEFT HEART CATHETERIZATION WITH EL BILE;  Surgeon: Candyce GORMAN Reek, MD;  Location: [facility] CATH LAB;  Service: Cardiovascular;  Laterality: N/A;   OVARY SURGERY     ovarian cancer   POLYPECTOMY  03/09/2019   Procedure: POLYPECTOMY;  Surgeon: Golda Claudis PENNER, MD;  [facility];  Service: Endoscopy;;  cecal    POLYPECTOMY N/A 09/26/2019   Procedure: DUODENAL POLYPECTOMY;  Surgeon: Golda Claudis PENNER, MD;  [facility];  Service: Endoscopy;  Laterality: N/A;   POLYPECTOMY  08/11/2022   Procedure: POLYPECTOMY;  Surgeon: Albertus Gordy HERO, MD;  Location: [facility] ENDOSCOPY;  Service: Gastroenterology;;   REVISION OF ARTERIOVENOUS GORETEX GRAFT N/A 02/24/2017   Procedure: REVISION OF ARTERIOVENOUS GORETEX GRAFT (RESECTION);  Surgeon: Jama Cordella MATSU, MD;  Location: [facility] ORS;  Service: Vascular;  Laterality: N/A;   REVISON OF ARTERIOVENOUS FISTULA Left 06/19/2020   Procedure: REVISION OF LEFT UPPER ARM AV GRAFT WITH INTERPOSITION JUMP GRAFT USING GORE LIMB;  Surgeon: Gretta Lonni PARAS, MD;  Location: [facility] OR;  Service: Vascular;  Laterality: Left;   SHUNTOGRAM N/A 10/15/2013   Procedure: Fistulogram;  Surgeon: Gaile LELON New, MD;  Location: [facility] CATH LAB;  Service: Cardiovascular;  Laterality: N/A;   SUBMUCOSAL TATTOO INJECTION  11/01/2022   Procedure: SUBMUCOSAL TATTOO INJECTION;  Surgeon: Eartha Angelia Sieving, MD;  [facility];   Service: Gastroenterology;;   THROMBECTOMY / ARTERIOVENOUS GRAFT REVISION  2011   left upper arm   TUBAL LIGATION  1980's   UPPER EXTREMITY ANGIOGRAPHY Bilateral 12/06/2016   Procedure: Upper Extremity Angiography;  Surgeon: Jama Cordella  G, MD;  Location: [facility] INVASIVE CV LAB;  Service: Cardiovascular;  Laterality: Bilateral;   UPPER EXTREMITY INTERVENTION Left 06/06/2017   Procedure: UPPER EXTREMITY INTERVENTION;  Surgeon: Jama Cordella MATSU, MD;  Location: [facility] INVASIVE CV LAB;  Service: Cardiovascular;  Laterality: Left;   UPPER EXTREMITY VENOGRAPHY Left 10/04/2022   Procedure: UPPER EXTREMITY VENOGRAPHY;  Surgeon: Jama Cordella MATSU, MD;  Location: [facility] INVASIVE CV LAB;  Service: Cardiovascular;  Laterality: Left;     HPI  from the history and physical done on the day of admission:   PCP: [facility]    Patient coming from: Home    Chief Complaint: Low blood glucose    HPI: Shawna [name] is an 83 y.o. female with medical history significant for AVMs with recurrent GI bleeding, chronic HFpEF, CAD status post CABG, PAF not anticoagulated, colon cancer status postresection, type 2 diabetes mellitus, and ESRD on hemodialysis who presents with low blood glucose.   Patient was reportedly in her usual state of health today at home, ate breakfast, and went to dialysis where she reportedly completed her session.  She was noted to be somnolent following the treatment and CBG was in the low 60s.  She was given food and drink but repeat CBG was still in the low 60s and she was sent to the ED for evaluation.     Patient has no complaints herself.  Patient has been evaluated by GI numerous times in the hospital and receives long-acting octreotide  injections and blood transfusions.   ED Course: Upon arrival to the ED, patient is found to be afebrile and saturating well on room air with stable BP.  Labs are most notable for potassium 2.5, albumin  2.9, and hemoglobin 6.5.   Patient  was treated with IV potassium and had 2 units RBC ordered for transfusion from the ED.   Review of Systems:  All other systems reviewed and apart from HPI, are negative.     Hospital Course:   Assessment and Plan: 1)Symptomatic acute on chronic blood loss anemia -Hemoglobin at time of admission 6.5 - 2 units PRBC provided - Hemodynamically stable otherwise - Prior history of AVMs; over a year without endoscopic evaluation.  Receiving octreotide  as an outpatient. - Appreciate assistance and recommendation by GI; planning for endoscopy evaluation later today 01/04/2024 - Continue to follow hemoglobin trend - Continue Pepcid ; patient allergic to PPI. - Patient with complaining of chronic anemia from renal failure - IV iron  and Epogen  as per nephrology discretion.   2-ESRD - Monday, Wednesday and Friday (regular schedule). - Tolerated dialysis treatment well on 01/03/2024. - Continue renally dosing medications and follow renal diet - Appreciate assistance and recommendation by nephrology service; planning for dialysis again tomorrow.   3-type 2 diabetes - Mild hypoglycemia appreciated at time of admission - Continue to follow CBGs fluctuation - Sliding scale insulin  will be provided.   4-hypokalemia - Repleted - Continue to follow trend.   5-paroxysmal atrial fibrillation - Continue Toprol  - Not a candidate for anticoagulation therapy with ongoing history of GI bleed.   6-coronary artery disease/hypertension - No anginal symptoms - Continue Imdur , Crestor  and metoprolol  - As needed hydralazine  also ordered.   7-history of seizure - Continue Keppra    8-hypothyroidism - Continue Synthroid    9-GERD - Patient allergic to PPI - Continue Pepcid .      Family Communication: Husband is primary contact  disposition:--- Home with home health  Discharge Condition: Stable  Follow UP     Consults  obtained -nephrology for HD  Diet and Activity recommendation:  As  advised  Discharge Instructions    **** Discharge Instructions     Call MD for:  difficulty breathing, headache or visual disturbances   Complete by: As directed    Call MD for:  persistant dizziness or light-headedness   Complete by: As directed    Call MD for:  persistant nausea and vomiting   Complete by: As directed    Call MD for:  temperature >100.4   Complete by: As directed    Diet - low sodium heart healthy   Complete by: As directed    Discharge instructions   Complete by: As directed    1)Very Low-salt diet advised---Less than 2 gm of Sodium per day advised----ok to use Mrs DASH salt substitute instead of Salt 2)Weigh yourself daily, call if you gain more than 3 pounds in 1 day or more than 5 pounds in 1 week as your diuretic medications may need to be adjusted 3)Limit your Fluid  intake to no more than 60 ounces (1.8 Liters) per day 4)Avoid ibuprofen /Advil /Aleve/Motrin Josefine Powders/Naproxen/BC powders/Meloxicam/Diclofenac /Indomethacin and other Nonsteroidal anti-inflammatory medications as these will make you more likely to bleed and can cause stomach ulcers, can also cause Kidney problems.  5)Continue hemodialysis on Mondays Wednesdays and Fridays 6)Repeat CBC blood test in 1 week   Increase activity slowly   Complete by: As directed    No wound care   Complete by: As directed          Discharge Medications     Allergies as of 01/05/2024       Reactions   Amlodipine  Swelling   Aspirin  Other (See Comments)   High Doses Mess up her stomach; makes my bowels have blood in them. Takes 81 mg EC Aspirin     Nitrofurantoin Hives   Ranexa  [ranolazine ] Other (See Comments)   Myoclonus-hospitalized    Bactrim [sulfamethoxazole-trimethoprim] Rash   Gabapentin  Other (See Comments)   Unknown reaction   Iodinated Contrast Media Itching, Other (See Comments)   Iron  Itching, Other (See Comments)   they gave me iron  in dialysis; had to give me Benadryl  cause I had to  have the iron  (05/02/2012)   Sucroferric Oxyhydroxide Other (See Comments)   Unknown   Tylenol  [acetaminophen ] Itching, Other (See Comments)   Makes her feet on fire per pt - can tolerate per pt   Levaquin  [levofloxacin  In D5w] Rash   Levofloxacin  Rash   Plavix  [clopidogrel  Bisulfate] Rash   Protonix  [pantoprazole  Sodium] Rash   Venofer [ferric Oxide] Itching, Other (See Comments)   Patient reports using Benadryl  prior to doses as [facility]        Medication List     TAKE these medications    acetaminophen  325 MG tablet Commonly known as: TYLENOL  Take 2 tablets (650 mg total) by mouth every 6 (six) hours as needed for mild pain (pain score 1-3), fever or headache (or Fever >/= 101).   albuterol  108 (90 Base) MCG/ACT inhaler Commonly known as: VENTOLIN  HFA Inhale 2 puffs into the lungs every 6 (six) hours as needed for wheezing or shortness of breath. What changed: See the new instructions.   albuterol  (2.5 MG/3ML) 0.083% nebulizer solution Commonly known as: PROVENTIL  Take 3 mLs (2.5 mg total) by nebulization every 4 (four) hours as needed for wheezing or shortness of breath (cough). What changed: See the new instructions.   benzonatate  100 MG capsule Commonly known as: TESSALON  Take 1 capsule (100 mg  total) by mouth 2 (two) times daily as needed for cough.   calcitRIOL  0.25 MCG capsule Commonly known as: ROCALTROL  Take 7 capsules (1.75 mcg total) by mouth every Monday, Wednesday, and Friday with hemodialysis.   cinacalcet  30 MG tablet Commonly known as: SENSIPAR  Take 1 tablet (30 mg total) by mouth every Monday, Wednesday, and Friday with hemodialysis.   cyanocobalamin  1000 MCG/ML injection Commonly known as: VITAMIN B12 Inject 1 mL (1,000 mcg total) into the muscle every 30 (thirty) days.   famotidine  20 MG tablet Commonly known as: Pepcid  Take 1 tablet (20 mg total) by mouth 2 (two) times daily. What changed:  when to take this reasons to take this    folic acid  1 MG tablet Commonly known as: FOLVITE  Take 1 tablet (1 mg total) by mouth daily.   guaifenesin  100 MG/5ML syrup Commonly known as: ROBITUSSIN Take 200 mg by mouth 3 (three) times daily as needed for cough.   hydrALAZINE  50 MG tablet Commonly known as: APRESOLINE  Take 1 tablet (50 mg total) by mouth 3 (three) times daily.   hydrOXYzine  25 MG tablet Commonly known as: ATARAX  Take 1 tablet (25 mg total) by mouth every 6 (six) hours as needed for itching.   isosorbide  mononitrate 30 MG 24 hr tablet Commonly known as: IMDUR  Take 1 tablet (30 mg total) by mouth daily.   lactulose 10 GM/15ML solution Commonly known as: CHRONULAC Take 30 mLs (20 g total) by mouth daily.   levETIRAcetam  500 MG tablet Commonly known as: KEPPRA  Take 1 tablet (500 mg total) by mouth daily.   levothyroxine  50 MCG tablet Commonly known as: SYNTHROID  Take 1 tablet (50 mcg total) by mouth daily.   metoprolol  succinate 25 MG 24 hr tablet Commonly known as: Toprol  XL Take 1 tablet (25 mg total) by mouth 2 (two) times daily. Take an extra 25 mg for heart rate over 100   multivitamin Tabs tablet Take 1 tablet by mouth daily.   nitroGLYCERIN  0.4 MG SL tablet Commonly known as: NITROSTAT  Place 0.4 mg under the tongue every 5 (five) minutes as needed for chest pain.   octreotide  20 MG injection Commonly known as: SANDOSTATIN  LAR Inject 20 mg into the muscle every 28 (twenty-eight) days.   ondansetron  4 MG disintegrating tablet Commonly known as: ZOFRAN -ODT Take 4 mg by mouth every 8 (eight) hours as needed for nausea or vomiting.   rosuvastatin  10 MG tablet Commonly known as: CRESTOR  Take 1 tablet by mouth once daily        Major procedures and Radiology Reports - PLEASE review detailed and final reports for all details, in brief -   ***  No results found.  Micro Results   *** Recent Results (from the past 240 hours)  MRSA Next Gen by PCR, Nasal     Status: None    Collection Time: 01/01/24 11:54 PM   Specimen: Nasal Mucosa; Nasal Swab  Result Value Ref Range Status   MRSA by PCR Next Gen NOT DETECTED NOT DETECTED Final    Comment: (NOTE) The GeneXpert MRSA Assay (FDA approved for NASAL specimens only), is one component of a comprehensive MRSA colonization surveillance program. It is not intended to diagnose MRSA infection nor to guide or monitor treatment for MRSA infections. Test performance is not FDA approved in patients less than 24 years old. Performed at [facility], [address], [place], [place] [address]     Today   Subjective    Shawna [name] today has no ***  Patient has been seen and examined prior to discharge   Objective   Blood pressure (!) 140/92, pulse (!) 126, temperature 97.6 F (36.4 C), temperature source Axillary, resp. rate (!) 37, height 5' 1 (1.549 m), weight 48.6 kg, SpO2 100%.   Intake/Output Summary (Last 24 hours) at 01/05/2024 1045 Last data filed at 01/05/2024 0607 Gross per 24 hour  Intake 340 ml  Output --  Net 340 ml    Exam Gen:- Awake Alert, in no acute distress, pleasantly confused HEENT:- [place].AT, No sclera icterus, Lt internal jugular HD catheter Neck-Supple Neck,No JVD,.  Lungs-  CTAB , fair air movement bilaterally  CV- S1, S2 normal, RRR, prior sternotomy scar Abd-  +ve B.Sounds, Abd Soft, No tenderness,    Extremity/Skin:- No  edema,   good pedal pulses  Psych-pleasantly confused, underlying cognitive and memory deficits consistent with advanced dementia Neuro-generalized weakness, no new focal deficits, no tremors   Data Review   CBC w Diff:  Lab Results  Component Value Date   WBC 7.1 01/05/2024   HGB 13.6 01/05/2024   HCT 41.7 01/05/2024   PLT 179 01/05/2024   LYMPHOPCT 8 01/01/2024   BANDSPCT 2 09/08/2023   MONOPCT 8 01/01/2024   EOSPCT 3 01/01/2024   BASOPCT 0 01/01/2024    CMP:  Lab Results  Component Value Date   NA 137 01/05/2024   K 4.2 01/05/2024   CL  99 01/05/2024   CO2 21 (L) 01/05/2024   BUN 33 (H) 01/05/2024   CREATININE 4.10 (H) 01/05/2024   CREATININE 6.57 (H) 03/05/2019   PROT 7.3 01/05/2024   ALBUMIN  3.2 (L) 01/05/2024   BILITOT 3.2 (H) 01/05/2024   ALKPHOS 244 (H) 01/05/2024   AST 38 01/05/2024   ALT 28 01/05/2024  .  Total Discharge time is about 33 minutes  [name] M.D on 01/05/2024 at 10:45 AM  Go to [email or web address] -  for contact info  [facility] - Office  [phone]

## 2024-01-05 NOTE — Telephone Encounter (Signed)
 Please make hospital follow-up for patient in 2 to 3 weeks with Dr. Eartha or myself.  [name]  - please arrange CBC for 1 to 2 weeks at [facility].  Please notify patient's husband.

## 2024-01-05 NOTE — Progress Notes (Signed)
   01/05/24 0252  Assess: MEWS Score  Temp 97.7 F (36.5 C)  BP (!) 133/102  MAP (mmHg) 110  Pulse Rate (!) 123  Resp 14  SpO2 98 %  O2 Device Room Air  Assess: MEWS Score  MEWS Temp 0  MEWS Systolic 0  MEWS Pulse 2  MEWS RR 0  MEWS LOC 0  MEWS Score 2  MEWS Score Color Yellow  Assess: if the MEWS score is Yellow or Red  Were vital signs accurate and taken at a resting state? Yes  Does the patient meet 2 or more of the SIRS criteria? No  MEWS guidelines implemented  No, previously red, continue vital signs every 4 hours  Provider Notification  Provider Name/Title Dr. Jeanella  Date Provider Notified 01/05/24  Provider response See new orders  Assess: SIRS CRITERIA  SIRS Temperature  0  SIRS Respirations  0  SIRS Pulse 1  SIRS WBC 0  SIRS Score Sum  1

## 2024-01-06 ENCOUNTER — Observation Stay (HOSPITAL_COMMUNITY)

## 2024-01-06 DIAGNOSIS — D62 Acute posthemorrhagic anemia: Secondary | ICD-10-CM | POA: Diagnosis present

## 2024-01-06 DIAGNOSIS — E872 Acidosis, unspecified: Secondary | ICD-10-CM | POA: Diagnosis not present

## 2024-01-06 DIAGNOSIS — K5751 Diverticulosis of both small and large intestine without perforation or abscess with bleeding: Secondary | ICD-10-CM | POA: Diagnosis present

## 2024-01-06 DIAGNOSIS — G40909 Epilepsy, unspecified, not intractable, without status epilepticus: Secondary | ICD-10-CM | POA: Diagnosis present

## 2024-01-06 DIAGNOSIS — K5521 Angiodysplasia of colon with hemorrhage: Secondary | ICD-10-CM | POA: Diagnosis present

## 2024-01-06 DIAGNOSIS — Z515 Encounter for palliative care: Secondary | ICD-10-CM | POA: Diagnosis not present

## 2024-01-06 DIAGNOSIS — F0390 Unspecified dementia without behavioral disturbance: Secondary | ICD-10-CM | POA: Diagnosis present

## 2024-01-06 DIAGNOSIS — E039 Hypothyroidism, unspecified: Secondary | ICD-10-CM | POA: Diagnosis present

## 2024-01-06 DIAGNOSIS — E876 Hypokalemia: Secondary | ICD-10-CM | POA: Diagnosis present

## 2024-01-06 DIAGNOSIS — R64 Cachexia: Secondary | ICD-10-CM | POA: Diagnosis present

## 2024-01-06 DIAGNOSIS — D649 Anemia, unspecified: Secondary | ICD-10-CM | POA: Diagnosis not present

## 2024-01-06 DIAGNOSIS — Z66 Do not resuscitate: Secondary | ICD-10-CM | POA: Diagnosis present

## 2024-01-06 DIAGNOSIS — D631 Anemia in chronic kidney disease: Secondary | ICD-10-CM | POA: Diagnosis present

## 2024-01-06 DIAGNOSIS — G9341 Metabolic encephalopathy: Secondary | ICD-10-CM | POA: Diagnosis present

## 2024-01-06 DIAGNOSIS — K2971 Gastritis, unspecified, with bleeding: Secondary | ICD-10-CM | POA: Diagnosis present

## 2024-01-06 DIAGNOSIS — K31811 Angiodysplasia of stomach and duodenum with bleeding: Secondary | ICD-10-CM | POA: Diagnosis present

## 2024-01-06 DIAGNOSIS — N186 End stage renal disease: Secondary | ICD-10-CM | POA: Diagnosis present

## 2024-01-06 DIAGNOSIS — K31819 Angiodysplasia of stomach and duodenum without bleeding: Secondary | ICD-10-CM | POA: Diagnosis not present

## 2024-01-06 DIAGNOSIS — E1122 Type 2 diabetes mellitus with diabetic chronic kidney disease: Secondary | ICD-10-CM | POA: Diagnosis present

## 2024-01-06 DIAGNOSIS — I132 Hypertensive heart and chronic kidney disease with heart failure and with stage 5 chronic kidney disease, or end stage renal disease: Secondary | ICD-10-CM | POA: Diagnosis present

## 2024-01-06 DIAGNOSIS — Z992 Dependence on renal dialysis: Secondary | ICD-10-CM | POA: Diagnosis not present

## 2024-01-06 LAB — GLUCOSE, CAPILLARY
Glucose-Capillary: 79 mg/dL (ref 70–99)
Glucose-Capillary: 111 mg/dL — ABNORMAL HIGH (ref 70–99)
Glucose-Capillary: 53 mg/dL — ABNORMAL LOW (ref 70–99)
Glucose-Capillary: 87 mg/dL (ref 70–99)
Glucose-Capillary: 116 mg/dL — ABNORMAL HIGH (ref 70–99)
Glucose-Capillary: 125 mg/dL — ABNORMAL HIGH (ref 70–99)

## 2024-01-06 MED ORDER — DEXTROSE 50 % IV SOLN
INTRAVENOUS | Status: AC
  Administered 2024-01-06: 12.5 g via INTRAVENOUS
  Filled 2024-01-06: qty 50

## 2024-01-06 MED ORDER — LACTULOSE 10 GM/15ML PO SOLN
20.0000 g | Freq: Three times a day (TID) | ORAL | Status: DC
  Administered 2024-01-06 – 2024-01-07 (×3): 20 g
  Filled 2024-01-06 (×3): qty 30

## 2024-01-06 MED ORDER — ISOSORBIDE DINITRATE 20 MG PO TABS
10.0000 mg | ORAL_TABLET | Freq: Two times a day (BID) | ORAL | Status: DC
  Administered 2024-01-06 – 2024-01-08 (×4): 10 mg via NASOGASTRIC
  Filled 2024-01-06 (×4): qty 1

## 2024-01-06 MED ORDER — HYDROXYZINE HCL 25 MG PO TABS
25.0000 mg | ORAL_TABLET | Freq: Four times a day (QID) | ORAL | Status: DC | PRN
  Administered 2024-01-07 – 2024-01-08 (×3): 25 mg
  Filled 2024-01-06 (×3): qty 1

## 2024-01-06 MED ORDER — HYDRALAZINE HCL 25 MG PO TABS: 50.0000 mg | ORAL_TABLET | Freq: Four times a day (QID) | ORAL | Status: DC | PRN

## 2024-01-06 MED ORDER — ACETAMINOPHEN 325 MG PO TABS
650.0000 mg | ORAL_TABLET | Freq: Four times a day (QID) | ORAL | Status: DC | PRN
  Administered 2024-01-07 – 2024-01-08 (×3): 650 mg
  Filled 2024-01-06 (×3): qty 2

## 2024-01-06 MED ORDER — ACETAMINOPHEN 650 MG RE SUPP: 650.0000 mg | Freq: Four times a day (QID) | RECTAL | Status: DC | PRN

## 2024-01-06 MED ORDER — GUAIFENESIN-DM 100-10 MG/5ML PO SYRP
5.0000 mL | ORAL_SOLUTION | ORAL | Status: DC | PRN
  Administered 2024-01-10: 5 mL
  Filled 2024-01-06: qty 5

## 2024-01-06 MED ORDER — LEVOTHYROXINE SODIUM 50 MCG PO TABS
50.0000 ug | ORAL_TABLET | Freq: Every day | ORAL | Status: DC
  Administered 2024-01-07 – 2024-01-08 (×2): 50 ug via NASOGASTRIC
  Filled 2024-01-06 (×2): qty 2

## 2024-01-06 MED ORDER — LEVETIRACETAM 500 MG PO TABS
500.0000 mg | ORAL_TABLET | Freq: Every day | ORAL | Status: DC
  Administered 2024-01-07 – 2024-01-08 (×2): 500 mg
  Filled 2024-01-06 (×2): qty 1

## 2024-01-06 MED ORDER — METOPROLOL TARTRATE 25 MG PO TABS
25.0000 mg | ORAL_TABLET | Freq: Three times a day (TID) | ORAL | Status: DC
  Administered 2024-01-06 – 2024-01-07 (×4): 25 mg via NASOGASTRIC
  Filled 2024-01-06 (×5): qty 1

## 2024-01-06 MED ORDER — DEXTROSE 50 % IV SOLN: 12.5000 g | Freq: Once | INTRAVENOUS | Status: AC

## 2024-01-06 MED ORDER — ENSURE PLUS HIGH PROTEIN PO LIQD: 237.0000 mL | Freq: Four times a day (QID) | ORAL | Status: DC

## 2024-01-06 MED ORDER — ENSURE PLUS HIGH PROTEIN PO LIQD
237.0000 mL | Freq: Four times a day (QID) | ORAL | Status: DC
  Administered 2024-01-06 – 2024-01-08 (×5): 237 mL via NASOGASTRIC

## 2024-01-06 MED ORDER — LACTULOSE 10 GM/15ML PO SOLN: 20.0000 g | Freq: Three times a day (TID) | ORAL | Status: DC

## 2024-01-06 NOTE — Progress Notes (Signed)
 Progress Note  Patient: Shawna [name] FMW:[id number] DOB: [birth year] DOA: 01/01/2024     0 DOS: the patient was seen and examined on 01/06/2024   Brief hospital admission narrative: As per H&P written by Dr. Charlton on 01/01/24 Shawna [name] is an 83 y.o. female with medical history significant for AVMs with recurrent GI bleeding, chronic HFpEF, CAD status post CABG, PAF not anticoagulated, colon cancer status postresection, type 2 diabetes mellitus, and ESRD on hemodialysis who presents with low blood glucose.   Patient was reportedly in her usual state of health today at home, ate breakfast, and went to dialysis where she reportedly completed her session.  She was noted to be somnolent following the treatment and CBG was in the low 60s.  She was given food and drink but repeat CBG was still in the low 60s and she was sent to the ED for evaluation.     Patient has no complaints herself.  Patient has been evaluated by GI numerous times in the hospital and receives long-acting octreotide  injections and blood transfusions.   ED Course: Upon arrival to the ED, patient is found to be afebrile and saturating well on room air with stable BP.  Labs are most notable for potassium 2.5, albumin  2.9, and hemoglobin 6.5.   Patient was treated with IV potassium and had 2 units RBC ordered for transfusion from the ED.  Assessment and plan 1)Symptomatic acute on chronic anemia --suspect acute blood loss anemia superimposed on chronic anemia due to ESRD and ongoing chronic GI blood loss -Hemoglobin at time of admission 6.5 - Prior history of AVMs; over a year without endoscopic evaluation.  Receiving octreotide  as an outpatient. - Status post EGD on 01/04/2024 with findings of Normal esophagus.  - Gastritis,  Six non- bleeding angioectasias in the duodenum. Treated with argon plasma coagulation ( APC) . - Non- bleeding jejunal diverticulum.  -- IV iron  and Epogen  as per nephrology discretion. - Hgb now greater than  13 after transfusion of 2 units of PRBCs this admission  2)ESRD - Monday, Wednesday and Friday (regular schedule). - Tolerated dialysis treatment well on 01/03/2024 and again on 01/05/2024 - Continue renally dosing medications and follow renal diet - Nephrology consult appreciated  3)-DM2- - Episodes of recurrent hypoglycemia due to poor oral intake  4-social/ethics--- plan of care, advanced directives and goals of care discussed with patient's husband and patient's daughter [name]---- -they confirm DNR DNI status - They are Not ready to transition to comfort care yet - They request trial of NG tube to allow for administration of lactulose and Ensure - Overall prognosis is poor  5)FTT/recurrent hypoglycemic episodes--- due to poor oral intake in the setting of worsening lethargy --Family  request trial of NG tube to allow for administration of lactulose and Ensure  6-coronary artery disease/hypertension - No anginal symptoms - Continue Imdur , Crestor  and metoprolol  - As needed hydralazine  also ordered.  7-history of seizure -No recent seizures - Continue Keppra   8-hypothyroidism - Continue Synthroid   9-GERD - Patient allergic to PPI - Continue Pepcid .  10) acute metabolic encephalopathy--elevated ammonia noted -Mentation did not improve after hemodialysis session - Poor oral intake - Received rectal lactulose on 01/05/2024 with large BM - Start oral lactulose via NG tube  11)hypokalemia - Repleted - Address electrolyte issues with hemodialysis sessions  12)paroxysmal atrial fibrillation - Continue Toprol  for rate control - Not a candidate for anticoagulation therapy with ongoing history of GI bleed.   Subjective:  -  Remains sleepy oriented x 1 only -Mentation did not improve after hemodialysis sessions --Patient's cousin and granddaughter at bedside, questions answered - Had large BM after rectal lactulose overnight - NG tube placed on 01/06/2024 at  family request  Physical Exam: Vitals:   01/06/24 0317 01/06/24 0500 01/06/24 0823 01/06/24 1411  BP: (!) 152/89  (!) 168/89 (!) 158/92  Pulse: (!) 138  90 87  Resp: 20  16 13   Temp: 98.1 F (36.7 C)  98.4 F (36.9 C) 98.4 F (36.9 C)  TempSrc: Oral  Axillary Axillary  SpO2: 93%  99% 99%  Weight:  47.6 kg    Height:       Gen:-More lethargic, not really verbal HEENT:- [place].AT, No sclera icterus, Lt internal jugular HD catheter Nose--- NG tube in situ Neck-Supple Neck,No JVD,.  Lungs-  CTAB , fair air movement bilaterally  CV- S1, S2 normal, RRR, prior sternotomy scar Abd-  +ve B.Sounds, Abd Soft, No tenderness,    Extremity/Skin:- No  edema,   good pedal pulses  Psych-pleasantly confused, underlying cognitive and memory deficits consistent with advanced dementia Neuro-generalized weakness, no new focal deficits, no tremors -  Family Communication: Husband and Daughter updated over the phone (01/06/2024).  Disposition: TBD Anticipating discharge back home once medically stable.  Author: [name], MD 01/06/2024 7:01 PM  For on call review [email or web address].

## 2024-01-06 NOTE — Plan of Care (Signed)
  Problem: Activity: Goal: Risk for activity intolerance will decrease Outcome: Not Progressing   Problem: Coping: Goal: Level of anxiety will decrease Outcome: Not Progressing   Problem: Elimination: Goal: Will not experience complications related to bowel motility Outcome: Progressing Goal: Will not experience complications related to urinary retention Outcome: Not Progressing   Problem: Pain Managment: Goal: General experience of comfort will improve and/or be controlled Outcome: Not Progressing   Problem: Fluid Volume: Goal: Ability to maintain a balanced intake and output will improve Outcome: Not Progressing   Problem: Health Behavior/Discharge Planning: Goal: Ability to manage health-related needs will improve Outcome: Not Progressing

## 2024-01-06 NOTE — Plan of Care (Signed)
  Problem: Clinical Measurements: Goal: Ability to maintain clinical measurements within normal limits will improve Outcome: Not Progressing   Problem: Activity: Goal: Risk for activity intolerance will decrease Outcome: Not Progressing   Problem: Nutrition: Goal: Adequate nutrition will be maintained Outcome: Not Progressing   

## 2024-01-07 DIAGNOSIS — D649 Anemia, unspecified: Secondary | ICD-10-CM | POA: Diagnosis not present

## 2024-01-07 LAB — COMPREHENSIVE METABOLIC PANEL WITH GFR
ALT: 72 U/L — ABNORMAL HIGH (ref 0–44)
AST: 131 U/L — ABNORMAL HIGH (ref 15–41)
Albumin: 2.8 g/dL — ABNORMAL LOW (ref 3.5–5.0)
Alkaline Phosphatase: 231 U/L — ABNORMAL HIGH (ref 38–126)
Anion gap: 16 — ABNORMAL HIGH (ref 5–15)
BUN: 47 mg/dL — ABNORMAL HIGH (ref 8–23)
CO2: 20 mmol/L — ABNORMAL LOW (ref 22–32)
Calcium: 8.5 mg/dL — ABNORMAL LOW (ref 8.9–10.3)
Chloride: 98 mmol/L (ref 98–111)
Creatinine, Ser: 5.35 mg/dL — ABNORMAL HIGH (ref 0.44–1.00)
GFR, Estimated: 7 mL/min — ABNORMAL LOW (ref >60)
Glucose, Bld: 224 mg/dL — ABNORMAL HIGH (ref 70–99)
Potassium: 3.5 mmol/L (ref 3.5–5.1)
Sodium: 134 mmol/L — ABNORMAL LOW (ref 135–145)
Total Bilirubin: 2.7 mg/dL — ABNORMAL HIGH (ref 0.0–1.2)
Total Protein: 6.6 g/dL (ref 6.5–8.1)

## 2024-01-07 LAB — GLUCOSE, CAPILLARY
Glucose-Capillary: 165 mg/dL — ABNORMAL HIGH (ref 70–99)
Glucose-Capillary: 104 mg/dL — ABNORMAL HIGH (ref 70–99)
Glucose-Capillary: 85 mg/dL (ref 70–99)
Glucose-Capillary: 105 mg/dL — ABNORMAL HIGH (ref 70–99)

## 2024-01-07 MED ORDER — LACTULOSE 10 GM/15ML PO SOLN
10.0000 g | Freq: Every day | ORAL | Status: DC
  Administered 2024-01-08: 10 g
  Filled 2024-01-07: qty 30

## 2024-01-07 MED ORDER — LORAZEPAM 2 MG/ML IJ SOLN
0.5000 mg | Freq: Once | INTRAMUSCULAR | Status: DC
  Filled 2024-01-07: qty 1

## 2024-01-07 MED ORDER — METOCLOPRAMIDE HCL 5 MG/ML IJ SOLN: 5.0000 mg | Freq: Four times a day (QID) | INTRAMUSCULAR | Status: DC

## 2024-01-07 NOTE — Progress Notes (Signed)
  Interdisciplinary Goals of Care Family Meeting   Date carried out: 01/07/2024  Location of the meeting: Bedside  Member's involved: Physician and Family Member or next of kin [name]) and [name] at bedside , [name] is on Speaker phone  Durable Power of Attorney or acting medical decision maker:  Husband Chief Strategy Officer  Discussion: We discussed goals of care for [employer] .   - Patient continues to be unresponsive, overall prognosis remains poor - No further significant hypoglycemia since NG tube feeding was instituted - Had at least 10 BMs in the last 16 hours with lactulose--mentation did Not improve -- Family Not ready to consult hospice or transition to comfort care at this time - Family requesting additional hemodialysis on 01/08/2024 - Continue current treatment plan  Code status:   Code Status: Limited: Do not attempt resuscitation (DNR) -DNR-LIMITED -Do Not Intubate/DNI    Disposition: Continue current acute care  Time spent for the meeting: 43 mins  [name], MD  01/07/2024, 6:15 PM

## 2024-01-07 NOTE — Plan of Care (Signed)
   Problem: Nutrition: Goal: Adequate nutrition will be maintained Outcome: Progressing

## 2024-01-07 NOTE — Progress Notes (Signed)
 Progress Note  Patient: Shawna [name] FMW:[id number] DOB: 02-08-1941 DOA: 01/01/2024     1 DOS: the patient was seen and examined on 01/07/2024   Brief hospital admission narrative: As per H&P written by Dr. Charlton on 01/01/24 Shawna [name] is an 83 y.o. female with medical history significant for AVMs with recurrent GI bleeding, chronic HFpEF, CAD status post CABG, PAF not anticoagulated, colon cancer status postresection, type 2 diabetes mellitus, and ESRD on hemodialysis who presents with low blood glucose.   Patient was reportedly in her usual state of health today at home, ate breakfast, and went to dialysis where she reportedly completed her session.  She was noted to be somnolent following the treatment and CBG was in the low 60s.  She was given food and drink but repeat CBG was still in the low 60s and she was sent to the ED for evaluation.     Patient has no complaints herself.  Patient has been evaluated by GI numerous times in the hospital and receives long-acting octreotide  injections and blood transfusions.   ED Course: Upon arrival to the ED, patient is found to be afebrile and saturating well on room air with stable BP.  Labs are most notable for potassium 2.5, albumin  2.9, and hemoglobin 6.5.   Patient was treated with IV potassium and had 2 units RBC ordered for transfusion from the ED.  Assessment and plan 1)Symptomatic acute on chronic anemia --suspect acute blood loss anemia superimposed on chronic anemia due to ESRD and ongoing chronic GI blood loss -Hemoglobin at time of admission 6.5 - Prior history of AVMs; over a year without endoscopic evaluation.  Receiving octreotide  as an outpatient. - Status post EGD on 01/04/2024 with findings of Normal esophagus.  - Gastritis,  Six non- bleeding angioectasias in the duodenum. Treated with argon plasma coagulation ( APC) . - Non- bleeding jejunal diverticulum.  -- c/n iron  and Epogen  as per nephrology discretion. - Hgb now greater than  13 after transfusion of 2 units of PRBCs this admission  2)ESRD - Monday, Wednesday and Friday (regular schedule). - Tolerated dialysis treatment well on 01/03/2024 and again on 01/05/2024 - Continue renally dosing medications and follow renal diet - Nephrology consult appreciated  3)-DM2- - Episodes of recurrent hypoglycemia due to poor oral intake  4-social/ethics--- plan of care, advanced directives and goals of care discussed with patient's husband and patient's daughter [name]---- -they confirm DNR DNI status - They are Not ready to transition to comfort care yet -01/07/24- Pt's Husband Reinaldo) and granddaughter Marissa at bedside , patient's daughter Marja is on Speaker phone Patient continues to be unresponsive, overall prognosis remains poor - No further significant hypoglycemia since NG tube feeding was instituted - Had at least 10 BMs in the last 16 hours with lactulose--mentation did Not improve -- Family Not ready to consult hospice or transition to comfort care at this time - Family requesting additional hemodialysis on 01/08/2024 - Continue current treatment plan  5)FTT/recurrent hypoglycemic episodes--- due to poor oral intake in the setting of worsening lethargy No further significant hypoglycemia since NG tube feeding was instituted  6-coronary artery disease/hypertension - No anginal symptoms - Continue Imdur , Crestor  and metoprolol  - As needed hydralazine  also ordered.  7-history of seizure -No recent seizures - Continue Keppra   8-hypothyroidism - Continue Synthroid   9-GERD - Patient allergic to PPI - Continue Pepcid .  10) acute metabolic encephalopathy--elevated ammonia noted -Mentation did not improve after hemodialysis session - Poor oral intake -  Received rectal lactulose on 01/05/2024 with large BM - Start oral lactulose via NG tube  11)hypokalemia - Repleted - Address electrolyte issues with hemodialysis sessions  12)paroxysmal  atrial fibrillation - Continue Toprol  for rate control - Not a candidate for anticoagulation therapy with ongoing history of GI bleed.   Subjective:  Pt's Husband Reinaldo) and granddaughter Marissa at bedside , patient's daughter Marja is on Speaker phone Patient continues to be unresponsive,   - No further significant hypoglycemia since NG tube feeding was instituted - Had at least 10 BMs in the last 16 hours with lactulose--mentation did Not improve  Physical Exam: Vitals:   01/06/24 2234 01/07/24 0500 01/07/24 0549 01/07/24 1214  BP: (!) 162/97  121/84 (!) 152/71  Pulse: 81  (!) 128 63  Resp:   18 18  Temp:   98.4 F (36.9 C) 98 F (36.7 C)  TempSrc:   Oral Oral  SpO2:   96% 97%  Weight:  47.6 kg    Height:       Gen:-Unresponsive, lethargic, not  verbal HEENT:- [place].AT, No sclera icterus, Lt internal jugular HD catheter Nose--- NG tube in situ Neck-Supple Neck,No JVD,.  Lungs-  CTAB , fair air movement bilaterally  CV- S1, S2 normal, RRR, prior sternotomy scar Abd-  +ve B.Sounds, Abd Soft, No tenderness,    Extremity/Skin:- No  edema,   good pedal pulses  Psych-pleasantly confused, underlying cognitive and memory deficits consistent with advanced dementia Neuro-generalized weakness, no new focal deficits, no tremors - Family Communication: Husband and Daughter updated over the phone (01/07/2024).  Disposition: TBD Anticipating discharge back home once medically stable.  Author: [name], MD 01/07/2024 6:22 PM  For on call review [email or web address].

## 2024-01-07 NOTE — Progress Notes (Signed)
 MEWS Progress Note  Patient Details Name: Shawna [name] MRN: [id number] DOB: [birth year] Today's Date: 01/07/2024   MEWS Flowsheet Documentation:  Assess: MEWS Score Temp: 98.4 F (36.9 C) BP: 121/84 MAP (mmHg): 94 Pulse Rate: (!) 128 ECG Heart Rate: (!) 109 Resp: 18 Level of Consciousness: Alert SpO2: 96 % O2 Device: Room Air Patient Activity (if Appropriate): In bed Assess: MEWS Score MEWS Temp: 0 MEWS Systolic: 0 MEWS Pulse: 2 MEWS RR: 0 MEWS LOC: 0 MEWS Score: 2 MEWS Score Color: Yellow Assess: SIRS CRITERIA SIRS Temperature : 0 SIRS Respirations : 0 SIRS Pulse: 1 SIRS WBC: 0 SIRS Score Sum : 1 SIRS Temperature : 0 SIRS Pulse: 1 SIRS Respirations : 0 SIRS WBC: 0 SIRS Score Sum : 1 Assess: if the MEWS score is Yellow or Red Were vital signs accurate and taken at a resting state?: Yes Does the patient meet 2 or more of the SIRS criteria?: No MEWS guidelines implemented : Yes, yellow Treat MEWS Interventions: Considered administering scheduled or prn medications/treatments as ordered Take Vital Signs Increase Vital Sign Frequency : Yellow: Q2hr x1, continue Q4hrs until patient remains green for 12hrs Escalate MEWS: Escalate: Yellow: Discuss with charge nurse and consider notifying provider and/or RRT Notify: Charge Nurse/RN Name of Charge Nurse/RN Notified: Metta Sar, RN Provider Notification Provider Name/Title: Manfred, MD Date Provider Notified: 01/07/24 Time Provider Notified: [id number] Method of Notification: Page Notification Reason: Other (Comment) (Yellow Mews) Provider response: No new orders Date of Provider Response: 01/07/24 Time of Provider Response: [id number]      Shawna [name] 01/07/2024, 6:04 AM

## 2024-01-07 NOTE — Plan of Care (Signed)
   Problem: Education: Goal: Knowledge of General Education information will improve Description: Including pain rating scale, medication(s)/side effects and non-pharmacologic comfort measures Outcome: Progressing   Problem: Clinical Measurements: Goal: Ability to maintain clinical measurements within normal limits will improve Outcome: Progressing Goal: Diagnostic test results will improve Outcome: Progressing

## 2024-01-07 NOTE — Plan of Care (Signed)
  Problem: Health Behavior/Discharge Planning: Goal: Ability to manage health-related needs will improve Outcome: Not Progressing   Problem: Clinical Measurements: Goal: Ability to maintain clinical measurements within normal limits will improve Outcome: Not Progressing   Problem: Safety: Goal: Ability to remain free from injury will improve Outcome: Progressing   Problem: Safety: Goal: Non-violent Restraint(s) Outcome: Progressing   Problem: Safety: Goal: Ability to remain free from injury will improve Outcome: Progressing

## 2024-01-08 ENCOUNTER — Other Ambulatory Visit (INDEPENDENT_AMBULATORY_CARE_PROVIDER_SITE_OTHER): Payer: Self-pay

## 2024-01-08 DIAGNOSIS — D649 Anemia, unspecified: Secondary | ICD-10-CM

## 2024-01-08 DIAGNOSIS — D62 Acute posthemorrhagic anemia: Secondary | ICD-10-CM

## 2024-01-08 DIAGNOSIS — K922 Gastrointestinal hemorrhage, unspecified: Secondary | ICD-10-CM

## 2024-01-08 DIAGNOSIS — Z515 Encounter for palliative care: Secondary | ICD-10-CM

## 2024-01-08 DIAGNOSIS — K625 Hemorrhage of anus and rectum: Secondary | ICD-10-CM

## 2024-01-08 DIAGNOSIS — I10 Essential (primary) hypertension: Secondary | ICD-10-CM

## 2024-01-08 LAB — CBC
HCT: 30.9 % — ABNORMAL LOW (ref 36.0–46.0)
HCT: 28.2 % — ABNORMAL LOW (ref 36.0–46.0)
Hemoglobin: 9.9 g/dL — ABNORMAL LOW (ref 12.0–15.0)
Hemoglobin: 9.5 g/dL — ABNORMAL LOW (ref 12.0–15.0)
MCH: 30.3 pg (ref 26.0–34.0)
MCH: 32.5 pg (ref 26.0–34.0)
MCHC: 32 g/dL (ref 30.0–36.0)
MCHC: 33.7 g/dL (ref 30.0–36.0)
MCV: 94.5 fL (ref 80.0–100.0)
MCV: 96.6 fL (ref 80.0–100.0)
Platelets: 216 10*3/uL (ref 150–400)
Platelets: 202 10*3/uL (ref 150–400)
RBC: 3.27 MIL/uL — ABNORMAL LOW (ref 3.87–5.11)
RBC: 2.92 MIL/uL — ABNORMAL LOW (ref 3.87–5.11)
RDW: 24.7 % — ABNORMAL HIGH (ref 11.5–15.5)
RDW: 24.8 % — ABNORMAL HIGH (ref 11.5–15.5)
WBC: 8.6 10*3/uL (ref 4.0–10.5)
WBC: 8.1 10*3/uL (ref 4.0–10.5)
nRBC: 0 % (ref 0.0–0.2)
nRBC: 0 % (ref 0.0–0.2)

## 2024-01-08 LAB — COMPREHENSIVE METABOLIC PANEL WITH GFR
ALT: 99 U/L — ABNORMAL HIGH (ref 0–44)
AST: 148 U/L — ABNORMAL HIGH (ref 15–41)
Albumin: 3 g/dL — ABNORMAL LOW (ref 3.5–5.0)
Alkaline Phosphatase: 202 U/L — ABNORMAL HIGH (ref 38–126)
Anion gap: 16 — ABNORMAL HIGH (ref 5–15)
BUN: 74 mg/dL — ABNORMAL HIGH (ref 8–23)
CO2: 23 mmol/L (ref 22–32)
Calcium: 8.8 mg/dL — ABNORMAL LOW (ref 8.9–10.3)
Chloride: 100 mmol/L (ref 98–111)
Creatinine, Ser: 7.5 mg/dL — ABNORMAL HIGH (ref 0.44–1.00)
GFR, Estimated: 5 mL/min — ABNORMAL LOW (ref >60)
Glucose, Bld: 126 mg/dL — ABNORMAL HIGH (ref 70–99)
Potassium: 4.2 mmol/L (ref 3.5–5.1)
Sodium: 139 mmol/L (ref 135–145)
Total Bilirubin: 2.8 mg/dL — ABNORMAL HIGH (ref 0.0–1.2)
Total Protein: 6.5 g/dL (ref 6.5–8.1)

## 2024-01-08 LAB — RENAL FUNCTION PANEL
Albumin: 2.8 g/dL — ABNORMAL LOW (ref 3.5–5.0)
Anion gap: 16 — ABNORMAL HIGH (ref 5–15)
BUN: 73 mg/dL — ABNORMAL HIGH (ref 8–23)
CO2: 23 mmol/L (ref 22–32)
Calcium: 8.6 mg/dL — ABNORMAL LOW (ref 8.9–10.3)
Chloride: 99 mmol/L (ref 98–111)
Creatinine, Ser: 7.28 mg/dL — ABNORMAL HIGH (ref 0.44–1.00)
GFR, Estimated: 5 mL/min — ABNORMAL LOW (ref >60)
Glucose, Bld: 124 mg/dL — ABNORMAL HIGH (ref 70–99)
Phosphorus: 4.2 mg/dL (ref 2.5–4.6)
Potassium: 3.9 mmol/L (ref 3.5–5.1)
Sodium: 138 mmol/L (ref 135–145)

## 2024-01-08 LAB — PROTIME-INR
INR: 2 — ABNORMAL HIGH (ref 0.8–1.2)
Prothrombin Time: 23.2 s — ABNORMAL HIGH (ref 11.4–15.2)

## 2024-01-08 LAB — AMMONIA: Ammonia: 46 umol/L — ABNORMAL HIGH (ref 9–35)

## 2024-01-08 LAB — GLUCOSE, CAPILLARY
Glucose-Capillary: 129 mg/dL — ABNORMAL HIGH (ref 70–99)
Glucose-Capillary: 78 mg/dL (ref 70–99)

## 2024-01-08 MED ORDER — MORPHINE SULFATE (CONCENTRATE) 10 MG /0.5 ML PO SOLN: 5.0000 mg | ORAL | Status: DC | PRN

## 2024-01-08 MED ORDER — LORAZEPAM 2 MG/ML PO CONC: 0.5000 mg | Freq: Four times a day (QID) | ORAL | Status: DC | PRN

## 2024-01-08 MED ORDER — ALBUTEROL SULFATE (2.5 MG/3ML) 0.083% IN NEBU: 2.5000 mg | INHALATION_SOLUTION | RESPIRATORY_TRACT | Status: DC | PRN

## 2024-01-08 MED ORDER — MORPHINE SULFATE (CONCENTRATE) 10 MG /0.5 ML PO SOLN
5.0000 mg | ORAL | Status: DC | PRN
  Administered 2024-01-08 – 2024-01-09 (×2): 5 mg via SUBLINGUAL
  Filled 2024-01-08 (×2): qty 0.5

## 2024-01-08 MED ORDER — OLANZAPINE 5 MG PO TBDP
5.0000 mg | ORAL_TABLET | Freq: Every day | ORAL | Status: DC | PRN
Start: 2024-01-08 — End: 2024-01-11

## 2024-01-08 MED ORDER — DOXERCALCIFEROL 4 MCG/2ML IV SOLN
INTRAVENOUS | Status: AC
  Filled 2024-01-08: qty 2

## 2024-01-08 MED ORDER — POLYVINYL ALCOHOL 1.4 % OP SOLN
1.0000 [drp] | Freq: Four times a day (QID) | OPHTHALMIC | Status: DC | PRN
Start: 2024-01-08 — End: 2024-01-11

## 2024-01-08 MED ORDER — ONDANSETRON 4 MG PO TBDP: 4.0000 mg | ORAL_TABLET | Freq: Four times a day (QID) | ORAL | Status: DC | PRN

## 2024-01-08 MED ORDER — HEPARIN SODIUM (PORCINE) 1000 UNIT/ML IJ SOLN
INTRAMUSCULAR | Status: AC
Start: 2024-01-08 — End: 2024-01-08
  Filled 2024-01-08: qty 5

## 2024-01-08 MED ORDER — ATROPINE SULFATE 1 % OP SOLN
4.0000 [drp] | OPHTHALMIC | Status: DC | PRN
  Administered 2024-01-08: 4 [drp] via SUBLINGUAL
  Filled 2024-01-08: qty 5

## 2024-01-08 MED ORDER — LORAZEPAM 1 MG PO TABS
1.0000 mg | ORAL_TABLET | ORAL | Status: DC | PRN
Start: 2024-01-08 — End: 2024-01-11
  Administered 2024-01-08 – 2024-01-11 (×3): 1 mg via ORAL
  Filled 2024-01-08 (×3): qty 1

## 2024-01-08 MED ORDER — BIOTENE DRY MOUTH MT LIQD: 15.0000 mL | OROMUCOSAL | Status: DC | PRN

## 2024-01-08 MED ORDER — ONDANSETRON HCL 4 MG/2ML IJ SOLN: 4.0000 mg | Freq: Four times a day (QID) | INTRAMUSCULAR | Status: DC | PRN

## 2024-01-08 MED ORDER — SCOPOLAMINE 1 MG/3DAYS TD PT72
1.0000 | MEDICATED_PATCH | TRANSDERMAL | Status: DC
  Administered 2024-01-09: 1.5 mg via TRANSDERMAL
  Filled 2024-01-08: qty 1

## 2024-01-08 MED ORDER — LORAZEPAM 2 MG/ML PO CONC: 1.0000 mg | ORAL | Status: DC | PRN

## 2024-01-08 NOTE — Telephone Encounter (Signed)
 Orders placed in Epic for [facility] to be done either 01/12/2024 - 01/19/2024.

## 2024-01-08 NOTE — Progress Notes (Signed)
 Patient ID: Shawna [name], female   DOB: [birth year], 83 y.o.   MRN: [id number] S: Pt remains unresponsive despite lacutlose.   O:BP (!) 149/77 (BP Location: Right Arm)   Pulse 60   Temp 99.3 F (37.4 C) (Oral)   Resp 20   Ht 5' 1 (1.549 m)   Wt 45.9 kg   SpO2 96%   BMI 19.12 kg/m  No intake or output data in the 24 hours ending 01/08/24 0823 Intake/Output: No intake/output data recorded.  Intake/Output this shift:  No intake/output data recorded. Weight change: -1.7 kg Gen: lying in bed, eyes open, unresponsive and nonverbal CVS: RRR Resp:CTA Abd: +BS, soft, NT/ND Ext: no edema  Recent Labs  Lab 01/01/24 1914 01/02/24 0947 01/03/24 0423 01/04/24 1026 01/05/24 0321 01/07/24 0415  NA 137 137 133* 136 137 134*  K 2.5* 3.6 3.8 3.8 4.2 3.5  CL 98 109 99 99 99 98  CO2 25 18* 22 25 21* 20*  GLUCOSE 148* 97 83 82 126* 224*  BUN 20 23 40* 27* 33* 47*  CREATININE 2.49* 2.40* 4.02* 3.44* 4.10* 5.35*  ALBUMIN  2.9*  --  3.1* 2.7* 3.2* 2.8*  CALCIUM  8.3* 6.7* 8.5* 8.5* 9.2 8.5*  PHOS  --   --  3.3  --  3.4  --   AST 46*  --   --  32 38 131*  ALT 35  --   --  24 28 72*   Liver Function Tests: Recent Labs  Lab 01/04/24 1026 01/05/24 0321 01/05/24 1000 01/07/24 0415  AST 32 38  --  131*  ALT 24 28  --  72*  ALKPHOS 219* 244*  --  231*  BILITOT 2.7* 3.3* 3.2* 2.7*  PROT 6.3* 7.3  --  6.6  ALBUMIN  2.7* 3.2*  --  2.8*   No results for input(s): LIPASE, AMYLASE in the last 168 hours. Recent Labs  Lab 01/05/24 0321  AMMONIA 54*   CBC: Recent Labs  Lab 01/01/24 1914 01/02/24 0947 01/03/24 0423 01/04/24 0411 01/05/24 0321  WBC 8.6 8.9 5.7 6.7 7.1  NEUTROABS 6.9  --   --   --   --   HGB 6.5* 13.3 13.3 12.6 13.6  HCT 20.2* 39.1 39.6 38.3 41.7  MCV 101.0* 96.1 94.3 93.2 94.3  PLT 194 147* 162 182 179   Cardiac Enzymes: No results for input(s): CKTOTAL, CKMB, CKMBINDEX, TROPONINI in the last 168 hours. CBG: Recent Labs  Lab 01/07/24 0716  01/07/24 1113 01/07/24 1720 01/07/24 2112 01/08/24 0714  GLUCAP 165* 104* 85 105* 129*    Iron  Studies: No results for input(s): IRON , TIBC, TRANSFERRIN, FERRITIN in the last 72 hours. Studies/Results: DG Abd 1 View Result Date: 01/06/2024 CLINICAL DATA:  [id number] Encounter for imaging study to confirm nasogastric (NG) tube placement [id number] EXAM: ABDOMEN - 1 VIEW COMPARISON:  October 06, 2023 FINDINGS: Esophagogastric tube terminates just to the right of midline, possibly in the gastric antrum or duodenal bulb. Nonobstructive bowel gas pattern. Multiple surgical clips in the pelvis. No pneumoperitoneum. No organomegaly or radiopaque calculi. Diffuse osteopenia. Sternotomy wires and partially visualized central venous catheter. Cardiomegaly. Bilateral interstitial opacities. IMPRESSION: 1. Esophagogastric tube terminates just to the right of midline, possibly in the gastric antrum or duodenal bulb. 2. Cardiomegaly. Bilateral interstitial opacities, may reflect interstitial edema. Partially visualized central venous catheter in the right atrium. Electronically Signed   By: Rogelia Myers M.D.   On: 01/06/2024 18:55    sodium chloride    Intravenous  Once   Chlorhexidine  Gluconate Cloth  6 each Topical Daily   Chlorhexidine  Gluconate Cloth  6 each Topical Q0600   cinacalcet   30 mg Oral Q M,W,F-HD   doxercalciferol   1 mcg Intravenous Q M,W,F-HD   feeding supplement  237 mL Per NG tube QID   insulin  aspart  0-5 Units Subcutaneous QHS   insulin  aspart  0-6 Units Subcutaneous TID WC   isosorbide  dinitrate  10 mg Per NG tube BID   lactulose  10 g Per Tube Daily   levETIRAcetam   500 mg Per Tube Daily   levothyroxine   50 mcg Per NG tube Q0600   LORazepam   0.5 mg Intravenous Once   metoCLOPramide  (REGLAN ) injection  5 mg Intravenous Q6H   metoprolol  tartrate  25 mg Per NG tube TID   rosuvastatin   10 mg Oral Daily   sodium chloride  flush  3 mL Intravenous Q12H    BMET    Component Value  Date/Time   NA 134 (L) 01/07/2024 0415   K 3.5 01/07/2024 0415   CL 98 01/07/2024 0415   CO2 20 (L) 01/07/2024 0415   GLUCOSE 224 (H) 01/07/2024 0415   BUN 47 (H) 01/07/2024 0415   CREATININE 5.35 (H) 01/07/2024 0415   CREATININE 6.57 (H) 03/05/2019 1159   CALCIUM  8.5 (L) 01/07/2024 0415   CALCIUM  8.1 (L) 05/29/2013 1814   GFRNONAA 7 (L) 01/07/2024 0415   GFRNONAA 6 (L) 03/05/2019 1159   GFRAA 9 (L) 02/12/2020 1712   GFRAA 6 (L) 03/05/2019 1159   CBC    Component Value Date/Time   WBC 7.1 01/05/2024 0321   RBC 4.42 01/05/2024 0321   HGB 13.6 01/05/2024 0321   HCT 41.7 01/05/2024 0321   PLT 179 01/05/2024 0321   MCV 94.3 01/05/2024 0321   MCH 30.8 01/05/2024 0321   MCHC 32.6 01/05/2024 0321   RDW 24.7 (H) 01/05/2024 0321   LYMPHSABS 0.7 01/01/2024 1914   MONOABS 0.7 01/01/2024 1914   EOSABS 0.3 01/01/2024 1914   BASOSABS 0.0 01/01/2024 1914   Outpatient HD orders:  [facility]  MWF  4 hours  BF 300 DF 500 ml/min EDW 50.5 kg  [place]: 2K/2.5 ca Meds: mircera 200 mcg given on 6/23, calcitriol  2 mcg PO with HD and cincalcet 30 mg with HD  Last post weight:  48.6 kg on 6/23 and left at 49 kg on 6/20  Assessment/Plan:  Acute on chronic anemia - due to chronic GI blood loss.  Has been a recurring issue.  Transfused and Hgb stable at 13.6.  will recheck today  ESRD - continue with HD on MWF schedule Acute metabolic encephalopathy - pt is unresponsive and nonverbal.  Has not responded to lactulose. DM type 2 with recurrent episodes of hypoglycemia - pt with poor intake.  Now on NGT feedings. H/o seizure disorder - on Keppra  Paroxysmal atrial fibrillation - on metoprolol .  Not a candidate for anticoagulation given h/o GIB. Abnormal LFT's- AST 131, ALT 72, T. Bili 2.7, alk phos 231.  Ammonia level up to 54.  Unclear etiology for abnormal LFT's.  Will discontinue rosuvastatin . Disposition - pt with failure to thrive, and now with AMS.  Agree with ongoing discussions with family  regarding goals of care and possible transition to comfort measures.  Recommend palliative care consult.  Poor overall prognosis.   Shawna RONAL Sellar, MD [facility]

## 2024-01-08 NOTE — TOC Initial Note (Signed)
 Transition of Care [facility]) - Initial/Assessment Note    Patient Details  Name: Shawna [name] MRN: [id number] Date of Birth: [birth year]  [facility]) CM/SW Contact:    Mcarthur Saddie Kim, LCSW Phone Number: 01/08/2024, 10:40 AM  Clinical Narrative:  Assessment completed due to high risk readmission score. Pt well known to TOC from previous admissions. Assessment completed with pt's husband. Pt's husband provides assistance with all ADLs and takes pt to dialysis. Her dialysis is MWF at [facility]. Pt is active with [facility]RN. Palliative consult pending. TOC will follow.                 Expected Discharge Plan:  (to be determined) Barriers to Discharge: Continued Medical Work up   Patient Goals and CMS Choice Patient states their goals for this hospitalization and ongoing recovery are:: to be determined   Choice offered to / list presented to : Spouse [place] ownership interest in [employer] [employer]provided to::  (n/a)    Expected Discharge Plan and Services In-house Referral: Clinical Social Work     Living arrangements for the past 2 months: Single Family Home Expected Discharge Date: 01/05/24                                    Prior Living Arrangements/Services Living arrangements for the past 2 months: Single Family Home Lives with:: Spouse Patient language and need for interpreter reviewed:: Yes        Need for Family Participation in Patient Care: Yes (Comment) Care giver support system in place?: Yes (comment) Current home services: DME (cane, walker, rollator) Criminal Activity/Legal Involvement Pertinent to Current Situation/Hospitalization: No - Comment as needed  Activities of Daily Living   ADL Screening (condition at time of admission) Independently performs ADLs?: Yes (appropriate for developmental age) Is the patient deaf or have difficulty hearing?: No Does the patient have difficulty seeing, even when wearing  glasses/contacts?: No Does the patient have difficulty concentrating, remembering, or making decisions?: Yes  Permission Sought/Granted                  Emotional Assessment         Alcohol  / Substance Use: Not Applicable    Admission diagnosis:  Hypokalemia [E87.6] Symptomatic anemia [D64.9] Acute metabolic encephalopathy [G93.41] Patient Active Problem List   Diagnosis Date Noted   Angiectasia of gastrointestinal tract 01/04/2024   Hypoglycemia associated with type 2 diabetes mellitus (HCC) 01/01/2024   DNR (do not resuscitate) 11/04/2023   Atrial fibrillation with rapid ventricular response (HCC) 10/07/2023   GI bleed 10/03/2023   Hypoxia 08/24/2023   Severe sepsis (HCC) 08/20/2023   Pleural effusion 08/20/2023   Multifocal pneumonia 08/19/2023   Symptomatic bradycardia 08/05/2023   Generalized weakness 07/12/2023   Sinus bradycardia 07/11/2023   FTT (failure to thrive) in adult 06/04/2023   Symptomatic anemia 03/08/2023   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   Acute blood loss anemia 01/27/2023   Paroxysmal atrial flutter (HCC) 12/14/2022   Pressure injury of skin 12/01/2022   Rectal bleeding 11/30/2022   Generalized abdominal pain 11/10/2022   Ileus (HCC) 11/04/2022   Periumbilical abdominal pain 11/04/2022   Macrocytic anemia 10/29/2022   Anemia of chronic disease 10/29/2022   Upper GI bleed 10/29/2022   Iron  deficiency anemia due to chronic blood loss 08/26/2022   Benign neoplasm of transverse colon 08/11/2022   Benign neoplasm of descending  colon 08/11/2022   Benign neoplasm of sigmoid colon 08/11/2022   Angiodysplasia of duodenum 08/11/2022   Delirium 04/22/2022   History of arteriovenous malformation (AVM) 04/22/2022   Acute metabolic encephalopathy 11/09/2021   NSTEMI, initial episode of care (HCC) 10/19/2021   Acute on chronic blood loss anemia 09/09/2021   Ischemic cardiomyopathy    Atrial fibrillation, chronic (HCC) 05/07/2021    Neuropathy 04/23/2021   Anemia associated with chronic renal failure 04/13/2021   Left knee pain 03/15/2021   Moderate protein-calorie malnutrition (HCC) 02/27/2021   Hypokalemia 02/27/2021   COVID-19 virus infection 02/03/2021   Leukocytosis 02/03/2021   Hypoalbuminemia due to protein-calorie malnutrition (HCC) 02/03/2021   Acquired hypothyroidism 02/03/2021   Myositis 12/03/2020   Ataxia 12/02/2020   Diabetes mellitus type 2 in nonobese (HCC) 11/10/2020   Failure to thrive in adult 11/09/2020   Dialysis AV fistula malfunction, initial encounter (HCC)    Jugular vein occlusion, right (HCC)    Failure of surgically constructed arteriovenous fistula (HCC) 10/03/2020   Myoclonus 08/31/2020   Clotted renal dialysis AV graft, initial encounter (HCC)    Hemodialysis-associated hypotension    Irritable bowel syndrome 02/25/2020   Pulmonary edema 10/14/2019   Adenomatous duodenal polyp 09/10/2019   History of GI bleed 09/10/2019   Acute respiratory failure with hypoxia (HCC) 12/25/2018   Elevated troponin 12/14/2018   Hand steal syndrome (HCC) 08/01/2017   Anemia 07/14/2017   CAD (coronary artery disease) 06/05/2017   Intestinal ischemia (HCC)    Complication of vascular access for dialysis 03/19/2017   Preoperative clearance 01/25/2017   H/O non-ST elevation myocardial infarction (NSTEMI) 10/24/2016   Non-ST elevation (NSTEMI) myocardial infarction [facility])    Heme positive stool    Cardiac arrest [facility])    Palliative care encounter    Goals of care, counseling/discussion    Flash pulmonary edema (HCC) 04/06/2016   History of colon cancer 01/27/2016   History of ovarian cancer 01/27/2016   Paroxysmal atrial fibrillation (HCC) 10/14/2015   Malignant neoplasm of right ovary (HCC) 10/14/2015   Wide-complex tachycardia 09/08/2015   SVT (supraventricular tachycardia) (HCC) 09/08/2015   Hypertensive urgency 05/04/2015   Essential hypertension    Dyspnea    Constipation 03/12/2013    Abdominal wall pain in left flank 03/12/2013   Occlusion and stenosis of carotid artery without mention of cerebral infarction 01/24/2013   Hx of CABG 07/05/2012   Carotid artery disease (HCC) 07/05/2012   Mitral regurgitation 06/12/2012   Non-STEMI (non-ST elevated myocardial infarction) (HCC) 06/08/2012   Chronic combined systolic and diastolic CHF (congestive heart failure) (HCC) 05/02/2012   AVM (arteriovenous malformation) of small bowel, acquired with hemorrhage 01/20/2012   GERD (gastroesophageal reflux disease) 01/09/2012   Mixed hyperlipidemia 01/05/2012   Atherosclerotic heart disease of native coronary artery without angina pectoris 12/16/2011   ESRD on hemodialysis (HCC) 12/16/2011   Anxiety disorder 05/04/2011   End-stage renal disease on hemodialysis (HCC) 04/29/2011   Gout 04/29/2011   PCP:  Practice, [facility]:   [facility] [address] [place] [address] [place] [address] Phone: [phone] Fax: [phone]  [facility] - [address] [place], [place] - [address] [place] [address] Phone: [phone] Fax: [phone]     Social Drivers of Health (SDOH) Social History: SDOH Screenings   Food Insecurity: No Food Insecurity (01/02/2024)  Housing: Low Risk  (01/02/2024)  Transportation Needs: No Transportation Needs (01/02/2024)  Utilities: Not At Risk (01/02/2024)  Recent  Concern: Utilities - At Risk (11/04/2023)  Financial Resource Strain: Low Risk  (12/14/2018)  Physical Activity: Unknown (12/14/2018)  Social Connections: Socially Integrated (01/02/2024)  Stress: No Stress Concern Present (12/14/2018)  Tobacco Use: Low Risk  (01/04/2024)  Health Literacy: Adequate Health Literacy (08/01/2023)   [facility]  Recent Concern: Health Literacy - Inadequate Health Literacy (07/20/2023)   [facility]   SDOH Interventions:     Readmission Risk Interventions    01/08/2024   10:38  AM 11/06/2023   10:21 AM 09/10/2023    9:23 AM  Readmission Risk Prevention Plan  Transportation Screening Complete Complete Complete  Medication Review [employer]) Complete Complete Complete  PCP or Specialist appointment within 3-5 days of discharge   Complete  HRI or Home Care Consult Complete Complete Complete  SW Recovery Care/Counseling Consult Complete Complete Complete  Palliative Care Screening -- Not Applicable Not Applicable  Skilled Nursing Facility Not Applicable Not Applicable Not Applicable

## 2024-01-08 NOTE — Progress Notes (Signed)
 Progress Note  Patient: Shawna [name] FMW:[id number] DOB: 1941-07-07 DOA: 01/01/2024     2 DOS: the patient was seen and examined on 01/08/2024   Brief hospital admission narrative: As per H&P written by Dr. Charlton on 01/01/24 Shawna [name] is an 83 y.o. female with medical history significant for AVMs with recurrent GI bleeding, chronic HFpEF, CAD status post CABG, PAF not anticoagulated, colon cancer status postresection, type 2 diabetes mellitus, and ESRD on hemodialysis who presents with low blood glucose.   Patient was reportedly in her usual state of health today at home, ate breakfast, and went to dialysis where she reportedly completed her session.  She was noted to be somnolent following the treatment and CBG was in the low 60s.  She was given food and drink but repeat CBG was still in the low 60s and she was sent to the ED for evaluation.     Patient has no complaints herself.  Patient has been evaluated by GI numerous times in the hospital and receives long-acting octreotide  injections and blood transfusions.   ED Course: Upon arrival to the ED, patient is found to be afebrile and saturating well on room air with stable BP.  Labs are most notable for potassium 2.5, albumin  2.9, and hemoglobin 6.5.   Patient was treated with IV potassium and had 2 units RBC ordered for transfusion from the ED.  Assessment and plan 1)Symptomatic acute on chronic anemia --suspect acute blood loss anemia superimposed on chronic anemia due to ESRD and ongoing chronic GI blood loss -Hemoglobin at time of admission 6.5 - Prior history of AVMs; over a year without endoscopic evaluation.  Receiving octreotide  as an outpatient. - Status post EGD on 01/04/2024 with findings of Normal esophagus.  - Gastritis,  Six non- bleeding angioectasias in the duodenum. Treated with argon plasma coagulation ( APC) . - Non- bleeding jejunal diverticulum.  -- c/n iron  and Epogen  as per nephrology discretion. - Hgb now greater than  9 after transfusion of 2 units of PRBCs this admission As of 01/08/24 patient remains largely nonverbal and unresponsive (since 01/05/2024)--despite interventions including hemodialysis on 01/05/2024 and hemodialysis on 01/08/2024, lactulose enemas and lactulose via NG tube with multiple (>10)  BMs---serum ammonia is only 46--patient remains unresponsive and nonverbal and unable to take oral intake--with recurrent episodes of hypoglycemia treated with initially dextrose  and then Ensure supplements via NG tube --. After Further conversations with patient's husband at bedside and daughter [name] on speaker phone on 01/08/24--family has decided to transition to full comfort care given overall poor prognosis. - Patient transitioned to full comfort care on 01/08/2024 at family's request -- Anticipate in-hospital death  2)ESRD - 02-04-24, 02/06/24 and Friday (regular schedule). - Tolerated dialysis treatment well on 01/03/2024 and again on 01/05/2024 and 01/08/24 - Nephrology consult appreciated - Patient transitioned to full comfort care on 01/08/2024 at family's request  3)-DM2- - Episodes of recurrent hypoglycemia due to poor oral intake  Patient transitioned to full comfort care on 01/08/2024 at family's request  4-social/ethics--- plan of care, advanced directives and goals of care discussed with patient's husband and patient's daughter [name]----and [name] -they confirm DNR DNI status  Patient transitioned to full comfort care on 01/08/2024 at family's request  5)FTT/recurrent hypoglycemic episodes--- due to poor oral intake in the setting of worsening lethargy  Patient transitioned to full comfort care on 01/08/2024 at family's request  6-coronary artery disease/hypertension - No anginal symptoms  Patient transitioned to full comfort  care on 01/08/2024 at family's request  7-history of seizure -No recent seizures  Patient transitioned to full comfort care on 01/08/2024 at  family's request  8-hypothyroidism  Patient transitioned to full comfort care on 01/08/2024 at family's request  9-GERD - Patient allergic to PPI  Patient transitioned to full comfort care on 01/08/2024 at family's request  10) acute metabolic encephalopathy--elevated ammonia noted -Mentation did not improve after hemodialysis session - Poor oral intake  Patient transitioned to full comfort care on 01/08/2024 at family's request  11)Paroxysmal atrial fibrillation - Not a candidate for anticoagulation therapy with ongoing history of GI bleed.  Patient transitioned to full comfort care on 01/08/2024 at family's request   Subjective:  [name])  at bedside , patient's daughter Shawna is on Speaker phone Patient continues to be unresponsive,    Patient transitioned to full comfort care on 01/08/2024 at family's request  Physical Exam: Vitals:   01/08/24 1300 01/08/24 1315 01/08/24 1334 01/08/24 1412  BP: (!) 155/53 (!) 151/55 (!) 156/54 (!) 168/70  Pulse:   (!) 56 63  Resp: 20 19 19 16   Temp:   98.2 F (36.8 C)   TempSrc:   Axillary   SpO2: 98% 99% 100% 97%  Weight:   44.7 kg   Height:       Gen:-Unresponsive, lethargic, not  verbal HEENT:- [place].AT, No sclera icterus, Lt internal jugular HD catheter Nose--- NG tube in situ--removed  Neck-Supple Neck,No JVD,.  Lungs-  CTAB , fair air movement bilaterally  CV- S1, S2 normal, RRR, prior sternotomy scar Abd-  +ve B.Sounds, Abd Soft, No tenderness,    Extremity/Skin:- No  edema,   good pedal pulses  Psych-pleasantly confused, lethargic underlying cognitive and memory deficits consistent with advanced dementia Neuro-generalized weakness, no new focal deficits, no tremors - Family Communication: Husband and Daughter updated    Disposition:   Patient transitioned to full comfort care on 01/08/2024 at family's request--anticipate in-hospital death  Author: [name], MD 01/08/2024 6:32 PM  For on call review  [email or web address].

## 2024-01-08 NOTE — Progress Notes (Signed)
 Discussion at patients bedside with MD Courage and patients husband Leonor. Patients daughter Marja was on the phone throughout the discussion, family agreed with wishes to transition patient to complete comfort care.

## 2024-01-08 NOTE — Progress Notes (Signed)
  HEMODIALYSIS TREATMENT NOTE:  Pt is responsive only to painful stimuli.  Non-verbal, unable to make her needs known.  VSS for HD.  3.5 hour heparin -free treatment completed.  1 liter removed.  Unsuitable for outpatient dialysis in her present state.  Palliative care meeting pending.     01/08/24 1334  Vitals  Temp 98.2 F (36.8 C)  Temp Source Axillary  BP (!) 156/54  MAP (mmHg) 83  BP Location Right Arm  BP Method Automatic  Patient Position (if appropriate) Lying  Pulse Rate (!) 56  Pulse Rate Source Monitor  ECG Heart Rate (!) 55  Resp 19  Weight 44.7 kg  Type of Weight Post-Dialysis  Oxygen  Therapy  SpO2 100 %  O2 Device Room Air  Post Treatment  Dialyzer Clearance Lightly streaked  Hemodialysis Intake (mL) 0 mL  Liters Processed 84  Fluid Removed (mL) 1000 mL  Tolerated HD Treatment Yes  Post-Hemodialysis Comments Goal met.  Hemodialysis Catheter Left Subclavian Double lumen Temporary (Non-Tunneled)  Placement Date: 06/09/22   Placed prior to admission: Yes  Orientation: Left  Access Location: Subclavian  Hemodialysis Catheter Type: Double lumen Temporary (Non-Tunneled)  Site Condition No complications  Blue Lumen Status Flushed;Heparin  locked;Dead end cap in place  Red Lumen Status Flushed;Heparin  locked;Dead end cap in place  Purple Lumen Status N/A  Catheter fill solution Heparin  1000 units/ml  Catheter fill volume (Arterial) 1.9 cc  Catheter fill volume (Venous) 1.9  Dressing Type Transparent;Tube stabilization device  Dressing Status Antimicrobial disc/dressing in place;Clean, Dry, Intact  Interventions Dressing reinforced  Drainage Description None  Dressing Change Due 01/10/24  Post treatment catheter status Capped and Clamped   Jon Laos, RN AP [facility]

## 2024-01-08 NOTE — Telephone Encounter (Signed)
 I spoke with the patient husband and made him aware to be done between 01/12/2024- 01/19/2024. He states understanding.

## 2024-01-08 NOTE — Plan of Care (Signed)
     Referral received for Shawna [name] for goals of care discussion. Chart reviewed and updates received from RN. Patient assessed and is unable to engage appropriately in discussions.   Attempted to contact patient's ***. Unable to reach. Voicemail left with contact information given.   I was able to speak with ***. GOC meeting scheduled for *** @ ***. Family is aware we will meet at patient's bedside.   PMT will re-attempt to contact family at a later time/date. Detailed note and recommendations to follow once GOC has been completed.   Thank you for your referral and allowing PMT to assist in Shawna [name]'s care.   Camellia Kays, NP Palliative Medicine Team Phone: [phone]  NO CHARGE

## 2024-01-08 NOTE — Progress Notes (Signed)
 As of 01/08/24 patient remains largely nonverbal and unresponsive (since 01/05/2024)--despite interventions including hemodialysis on 01/05/2024 and hemodialysis on 01/08/2024, lactulose enemas and lactulose via NG tube with multiple (>10)  BMs---serum ammonia is only 46--patient remains unresponsive and nonverbal and unable to take oral intake--with recurrent episodes of hypoglycemia treated with initially dextrose  and then Ensure supplements via NG tube --. After Further conversations with patient's husband at bedside and daughter [name] on speaker phone on 01/08/24--family has decided to transition to full comfort care given overall poor prognosis. - Patient transitioned to full comfort care on 01/08/2024 at family's request -- Anticipate in-hospital death  [name], MD

## 2024-01-08 NOTE — Plan of Care (Signed)
  Problem: Health Behavior/Discharge Planning: Goal: Ability to manage health-related needs will improve Outcome: Not Progressing   Problem: Clinical Measurements: Goal: Respiratory complications will improve Outcome: Not Progressing   Problem: Activity: Goal: Risk for activity intolerance will decrease Outcome: Not Progressing   Problem: Nutrition: Goal: Adequate nutrition will be maintained Outcome: Not Progressing   Problem: Pain Managment: Goal: General experience of comfort will improve and/or be controlled Outcome: Not Progressing   Problem: Skin Integrity: Goal: Risk for impaired skin integrity will decrease Outcome: Not Progressing

## 2024-01-09 DIAGNOSIS — Z515 Encounter for palliative care: Secondary | ICD-10-CM

## 2024-01-09 DIAGNOSIS — D649 Anemia, unspecified: Secondary | ICD-10-CM | POA: Diagnosis not present

## 2024-01-09 MED ORDER — DIPHENHYDRAMINE HCL 50 MG/ML IJ SOLN: 12.5000 mg | Freq: Three times a day (TID) | INTRAMUSCULAR | Status: DC | PRN

## 2024-01-09 NOTE — Progress Notes (Signed)
   01/09/24 1038  Spiritual Encounters  Type of Visit Initial  Care provided to: Patient  Reason for visit End-of-life   Chaplain responding to spiritual consult stating EOL arrived in room to find Pt in bed and no family bedside.  Chaplain checked with the front desk and they are anticipating family arriving this morning from out of town.  Rn will message me when they are present.  Maude Roll Mdiv Chaplain, [facility] [email or web address] [phone]

## 2024-01-09 NOTE — Progress Notes (Signed)
 Patient ID: Shawna [name], female   DOB: 27-Apr-1941, 83 y.o.   MRN: [id number] S: Pt has not improved despite multiple interventions.  Family has decided to proceed with comfort measures. O:BP (!) 113/43 (BP Location: Right Arm)   Pulse 65   Temp 98.2 F (36.8 C) (Axillary)   Resp 16   Ht 5' 1 (1.549 m)   Wt 44.7 kg   SpO2 99%   BMI 18.62 kg/m   Intake/Output Summary (Last 24 hours) at 01/09/2024 0947 Last data filed at 01/08/2024 1334 Gross per 24 hour  Intake --  Output 1000 ml  Net -1000 ml   Intake/Output: I/O last 3 completed shifts: In: -  Out: 1000 [Other:1000]  Intake/Output this shift:  No intake/output data recorded. Weight change: -0.1 kg Gen: unresponsive and nonverbal CVS: RRR Resp:scattered rhonchi Abd: +BS, soft, NT/ND Ext: no edema  Recent Labs  Lab 01/03/24 0423 01/04/24 1026 01/05/24 0321 01/07/24 0415 01/08/24 0924 01/08/24 1030  NA 133* 136 137 134* 139 138  K 3.8 3.8 4.2 3.5 4.2 3.9  CL 99 99 99 98 100 99  CO2 22 25 21* 20* 23 23  GLUCOSE 83 82 126* 224* 126* 124*  BUN 40* 27* 33* 47* 74* 73*  CREATININE 4.02* 3.44* 4.10* 5.35* 7.50* 7.28*  ALBUMIN  3.1* 2.7* 3.2* 2.8* 3.0* 2.8*  CALCIUM  8.5* 8.5* 9.2 8.5* 8.8* 8.6*  PHOS 3.3  --  3.4  --   --  4.2  AST  --  32 38 131* 148*  --   ALT  --  24 28 72* 99*  --    Liver Function Tests: Recent Labs  Lab 01/05/24 0321 01/05/24 1000 01/07/24 0415 01/08/24 0924 01/08/24 1030  AST 38  --  131* 148*  --   ALT 28  --  72* 99*  --   ALKPHOS 244*  --  231* 202*  --   BILITOT 3.3* 3.2* 2.7* 2.8*  --   PROT 7.3  --  6.6 6.5  --   ALBUMIN  3.2*  --  2.8* 3.0* 2.8*   No results for input(s): LIPASE, AMYLASE in the last 168 hours. Recent Labs  Lab 01/05/24 0321 01/08/24 0924  AMMONIA 54* 46*   CBC: Recent Labs  Lab 01/03/24 0423 01/04/24 0411 01/05/24 0321 01/08/24 0924 01/08/24 1030  WBC 5.7 6.7 7.1 8.6 8.1  HGB 13.3 12.6 13.6 9.9* 9.5*  HCT 39.6 38.3 41.7 30.9* 28.2*  MCV 94.3  93.2 94.3 94.5 96.6  PLT 162 182 179 216 202   Cardiac Enzymes: No results for input(s): CKTOTAL, CKMB, CKMBINDEX, TROPONINI in the last 168 hours. CBG: Recent Labs  Lab 01/07/24 1113 01/07/24 1720 01/07/24 2112 01/08/24 0714 01/08/24 1628  GLUCAP 104* 85 105* 129* 78    Iron  Studies: No results for input(s): IRON , TIBC, TRANSFERRIN, FERRITIN in the last 72 hours. Studies/Results: No results found.  scopolamine  1 patch Transdermal Q72H    BMET    Component Value Date/Time   NA 138 01/08/2024 1030   K 3.9 01/08/2024 1030   CL 99 01/08/2024 1030   CO2 23 01/08/2024 1030   GLUCOSE 124 (H) 01/08/2024 1030   BUN 73 (H) 01/08/2024 1030   CREATININE 7.28 (H) 01/08/2024 1030   CREATININE 6.57 (H) 03/05/2019 1159   CALCIUM  8.6 (L) 01/08/2024 1030   CALCIUM  8.1 (L) 05/29/2013 1814   GFRNONAA 5 (L) 01/08/2024 1030   GFRNONAA 6 (L) 03/05/2019 1159   GFRAA 9 (L) 02/12/2020  1712   GFRAA 6 (L) 03/05/2019 1159   CBC    Component Value Date/Time   WBC 8.1 01/08/2024 1030   RBC 2.92 (L) 01/08/2024 1030   HGB 9.5 (L) 01/08/2024 1030   HCT 28.2 (L) 01/08/2024 1030   PLT 202 01/08/2024 1030   MCV 96.6 01/08/2024 1030   MCH 32.5 01/08/2024 1030   MCHC 33.7 01/08/2024 1030   RDW 24.8 (H) 01/08/2024 1030   LYMPHSABS 0.7 01/01/2024 1914   MONOABS 0.7 01/01/2024 1914   EOSABS 0.3 01/01/2024 1914   BASOSABS 0.0 01/01/2024 1914    Outpatient HD orders:  [facility]  MWF  4 hours  BF 300 DF 500 ml/min EDW 50.5 kg  [place]ath: 2K/2.5 ca Meds: mircera 200 mcg given on 6/23, calcitriol  2 mcg PO with HD and cincalcet 30 mg with HD  Last post weight:  48.6 kg on 6/23 and left at 49 kg on 6/20   Assessment/Plan:   Acute on chronic anemia - due to chronic GI blood loss.  Has been a recurring issue.  Transfused and Hgb stable at 13.6.  will recheck today  ESRD - now on comfort care.  No more dialysis.  Acute metabolic encephalopathy - pt is unresponsive and nonverbal.   Has not responded to lactulose. DM type 2 with recurrent episodes of hypoglycemia - pt with poor intake.  Now on NGT feedings. H/o seizure disorder - on Keppra  Paroxysmal atrial fibrillation - on metoprolol .  Not a candidate for anticoagulation given h/o GIB. Abnormal LFT's- AST 131, ALT 72, T. Bili 2.7, alk phos 231.  Ammonia level up to 54.  Unclear etiology for abnormal LFT's.  Will discontinue rosuvastatin . Disposition - pt with failure to thrive, and now with AMS.  Agree with transition to comfort measures.  No further HD, will sign off.   Fairy RONAL Sellar, MD [facility]

## 2024-01-09 NOTE — Plan of Care (Signed)
 Pt nonverbal responds pain. On comfort care at this time.  Problem: Activity: Goal: Risk for activity intolerance will decrease Outcome: Not Progressing   Problem: Nutrition: Goal: Adequate nutrition will be maintained Outcome: Not Progressing   Problem: Clinical Measurements: Goal: Will remain free from infection Outcome: Progressing   Problem: Coping: Goal: Level of anxiety will decrease Outcome: Progressing   Problem: Pain Managment: Goal: General experience of comfort will improve and/or be controlled Outcome: Progressing   Problem: Safety: Goal: Ability to remain free from injury will improve Outcome: Progressing

## 2024-01-09 NOTE — TOC Progression Note (Signed)
 Transition of Care [facility]) - Progression Note    Patient Details  Name: Shawna [name] MRN: [id number] Date of Birth: [birth year]  [facility] CM/SW Contact  Mcarthur Saddie Kim, [place] Phone Number: 01/09/2024, 2:36 PM  Clinical Narrative: Discussed hospice referral with pt's husband and daughter. They are agreeable to [facility]. LCSW made referral to [facility] for [facility] vs [facility]. Hospice RN to assess tomorrow. Pt's daughter updated.       Expected Discharge Plan:  (to be determined) Barriers to Discharge: Continued Medical Work up  Expected Discharge Plan and Services In-house Referral: Clinical Social Work     Living arrangements for the past 2 months: Single Family Home Expected Discharge Date: 01/05/24                                     Social Determinants of Health (SDOH) Interventions SDOH Screenings   Food Insecurity: No Food Insecurity (01/02/2024)  Housing: Low Risk  (01/02/2024)  Transportation Needs: No Transportation Needs (01/02/2024)  Utilities: Not At Risk (01/02/2024)  Recent Concern: Utilities - At Risk (11/04/2023)  Financial Resource Strain: Low Risk  (12/14/2018)  Physical Activity: Unknown (12/14/2018)  Social Connections: Socially Integrated (01/02/2024)  Stress: No Stress Concern Present (12/14/2018)  Tobacco Use: Low Risk  (01/04/2024)  Health Literacy: Adequate Health Literacy (08/01/2023)   Received from [facility]  Recent Concern: Health Literacy - Inadequate Health Literacy (07/20/2023)   Received from [facility]    Readmission Risk Interventions    01/08/2024   10:38 AM 11/06/2023   10:21 AM 09/10/2023    9:23 AM  Readmission Risk Prevention Plan  Transportation Screening Complete Complete Complete  Medication Review Oceanographer) Complete Complete Complete  PCP or Specialist appointment within 3-5 days of discharge   Complete  HRI or Home Care Consult Complete Complete Complete  SW Recovery Care/Counseling Consult  Complete Complete Complete  Palliative Care Screening -- Not Applicable Not Applicable  Skilled Nursing Facility Not Applicable Not Applicable Not Applicable

## 2024-01-09 NOTE — Progress Notes (Signed)
   01/09/24 1205  Spiritual Encounters  Type of Visit Follow up  Care provided to: Patient;Family  Conversation partners present during encounter Physician  Reason for visit End-of-life  Interventions  Spiritual Care Interventions Made Established relationship of care and support;Compassionate presence;Supported grief process;Prayer   Chaplain was informed that Pt's family had arrived on the floor and was at beside.  Upon arriving in the room the Physician was speaking with family and he introduced me to them.  Chaplain provided compassionate presence to family and encouraged breif life review and story telling.  Chaplain provided prayer and then left family to sit with Pt in privacy.  Maude Roll, Mdiv Chaplain, [facility] [email or web address] [phone]

## 2024-01-09 NOTE — Care Management Important Message (Signed)
 Important Message  Patient Details  Name: CHRISANNE LOOSE MRN: [id number] Date of Birth: 09-04-1940   Important Message Given:  No (due to being transferred to full comfort care)     Kiyoshi Schaab L Previn Jian 01/09/2024, 1:10 PM

## 2024-01-09 NOTE — Plan of Care (Signed)
     Referral previously received for Shawna [name] for goals of care discussion.  Note unable to make contact with patient and family yesterday for goals of care conversation.  Chart reviewed for Recent provider notes, nurse notes, and vitals and updates received from RN.  I discussed the patient with hospitalist Dr. [name].  At this time patient appears stable and transition to full comfort care yesterday.  Per Dr. Carwin, no need to see patient as she is stable and comfortable. No plan for in person follow-up at this time.   We will sign off at this time.  Please feel free to consult us  for any new palliative needs.  Thank you for your referral and allowing PMT to assist in Shawna [name]'s care.   Shawna Kays, NP Palliative Medicine Team Phone: [phone]  NO CHARGE

## 2024-01-09 NOTE — Progress Notes (Signed)
 Progress Note  Patient: Shawna [name] FMW:[id number] DOB: February 24, 1941 DOA: 01/01/2024     3 DOS: the patient was seen and examined on 01/09/2024   Brief hospital admission narrative: As per H&P written by Dr. Charlton on 01/01/24 Shawna [name] is an 83 y.o. female with medical history significant for AVMs with recurrent GI bleeding, chronic HFpEF, CAD status post CABG, PAF not anticoagulated, colon cancer status postresection, type 2 diabetes mellitus, and ESRD on hemodialysis who presents with low blood glucose.   Patient was reportedly in her usual state of health today at home, ate breakfast, and went to dialysis where she reportedly completed her session.  She was noted to be somnolent following the treatment and CBG was in the low 60s.  She was given food and drink but repeat CBG was still in the low 60s and she was sent to the ED for evaluation.     Patient has no complaints herself.  Patient has been evaluated by GI numerous times in the hospital and receives long-acting octreotide  injections and blood transfusions.   ED Course: Upon arrival to the ED, patient is found to be afebrile and saturating well on room air with stable BP.  Labs are most notable for potassium 2.5, albumin  2.9, and hemoglobin 6.5.   Patient was treated with IV potassium and had 2 units RBC ordered for transfusion from the ED.  Assessment and plan 1)Symptomatic acute on chronic anemia --suspect acute blood loss anemia superimposed on chronic anemia due to ESRD and ongoing chronic GI blood loss -Hemoglobin at time of admission 6.5 - Prior history of AVMs; over a year without endoscopic evaluation.  Receiving octreotide  as an outpatient. - Status post EGD on 01/04/2024 with findings of Normal esophagus.  - Gastritis,  Six non- bleeding angioectasias in the duodenum. Treated with argon plasma coagulation ( APC) . - Non- bleeding jejunal diverticulum.  -- c/n iron  and Epogen  as per nephrology discretion. - Hgb now greater than  9 after transfusion of 2 units of PRBCs this admission As of 01/08/24 patient remains largely nonverbal and unresponsive (since 01/05/2024)--despite interventions including hemodialysis on 01/05/2024 and hemodialysis on 01/08/2024, lactulose enemas and lactulose via NG tube with multiple (>10)  BMs---serum ammonia is only 46--patient remains unresponsive and nonverbal and unable to take oral intake--with recurrent episodes of hypoglycemia treated with initially dextrose  and then Ensure supplements via NG tube --. After Further conversations with patient's husband at bedside and daughter Shawna [name] on speaker phone on 01/08/24--family has decided to transition to full comfort care given overall poor prognosis. - Patient transitioned to full comfort care on 01/08/2024 at family's request -- Anticipate in-hospital death  2)ESRD - February 08, 2024, February 10, 2024 and Friday (regular schedule). - Tolerated dialysis treatment well on 01/03/2024 and again on 01/05/2024 and 01/08/24 - Nephrology consult appreciated - Patient transitioned to full comfort care on 01/08/2024 at family's request  3)-DM2- - Episodes of recurrent hypoglycemia due to poor oral intake  Patient transitioned to full comfort care on 01/08/2024 at family's request  4-social/ethics--- plan of care, advanced directives and goals of care discussed with patient's husband and [name]----and [name] -they confirm DNR DNI status  Patient transitioned to full comfort care on 01/08/2024 at family's request  5)FTT/recurrent hypoglycemic episodes--- due to poor oral intake in the setting of worsening lethargy  Patient transitioned to full comfort care on 01/08/2024 at family's request  6-coronary artery disease/hypertension - No anginal symptoms  Patient transitioned to full comfort  care on 01/08/2024 at family's request  7-history of seizure -No recent seizures  Patient transitioned to full comfort care on 01/08/2024 at  family's request  8-hypothyroidism  Patient transitioned to full comfort care on 01/08/2024 at family's request  9-GERD - Patient allergic to PPI  Patient transitioned to full comfort care on 01/08/2024 at family's request  10) acute metabolic encephalopathy--elevated ammonia noted -Mentation did not improve after hemodialysis session - Poor oral intake  Patient transitioned to full comfort care on 01/08/2024 at family's request  11)Paroxysmal atrial fibrillation - Not a candidate for anticoagulation therapy with ongoing history of GI bleed.  Patient transitioned to full comfort care on 01/08/2024 at family's request   Subjective:  - patient's daughter Marja is at bedside on 01/09/24 - Granddaughter at bedside Patient continues to be unresponsive,    Patient transitioned to full comfort care on 01/08/2024 at family's request - - Awaiting hospice consult for possible transition to residential hospice services [facility] and stay here  Physical Exam: Vitals:   01/08/24 1315 01/08/24 1334 01/08/24 1412 01/09/24 0026  BP: (!) 151/55 (!) 156/54 (!) 168/70 (!) 113/43  Pulse:  (!) 56 63 65  Resp: 19 19 16 16   Temp:  98.2 F (36.8 C)    TempSrc:  Axillary    SpO2: 99% 100% 97% 99%  Weight:  44.7 kg    Height:       Gen:-Unresponsive, lethargic, not  verbal HEENT:- [place].AT, No sclera icterus, Lt internal jugular HD catheter Nose--- NG tube in situ--removed  Neck-Supple Neck,No JVD,.  Lungs-  CTAB , fair air movement bilaterally  CV- S1, S2 normal, RRR, prior sternotomy scar Abd-  +ve B.Sounds, Abd Soft, No tenderness,    Extremity/Skin:- No  edema,   good pedal pulses  Psych- lethargic underlying cognitive and memory deficits consistent with advanced dementia Neuro-generalized weakness, no new focal deficits, no tremors - Family Communication: Husband and Daughter updated    Disposition:   Patient transitioned to full comfort care on 01/08/2024 at family's request--anticipate in-hospital  death  Author: [name], MD 01/09/2024 8:41 PM  For on call review [email or web address].

## 2024-01-10 DIAGNOSIS — K31819 Angiodysplasia of stomach and duodenum without bleeding: Secondary | ICD-10-CM | POA: Diagnosis not present

## 2024-01-10 DIAGNOSIS — G9341 Metabolic encephalopathy: Secondary | ICD-10-CM | POA: Diagnosis not present

## 2024-01-10 DIAGNOSIS — D649 Anemia, unspecified: Secondary | ICD-10-CM | POA: Diagnosis not present

## 2024-01-10 DIAGNOSIS — E876 Hypokalemia: Secondary | ICD-10-CM | POA: Diagnosis not present

## 2024-01-10 MED ORDER — DIPHENHYDRAMINE-ZINC ACETATE 2-0.1 % EX CREA
TOPICAL_CREAM | Freq: Three times a day (TID) | CUTANEOUS | Status: DC | PRN
  Filled 2024-01-10: qty 28

## 2024-01-10 NOTE — Progress Notes (Signed)
 PROGRESS NOTE   Shawna [name]  FMW:[id number] DOB: February 16, 1941 DOA: 01/01/2024 PCP: [facility]   Chief Complaint  Patient presents with   Hypoglycemia   Level of care: Med-Surg  Brief Admission History:  83 y.o. female with medical history significant for AVMs with recurrent GI bleeding, chronic HFpEF, CAD status post CABG, PAF not anticoagulated, colon cancer status postresection, type 2 diabetes mellitus, and ESRD on hemodialysis who presents with low blood glucose.   Patient was reportedly in her usual state of health today at home, ate breakfast, and went to dialysis where she reportedly completed her session.  She was noted to be somnolent following the treatment and CBG was in the low 60s.  She was given food and drink but repeat CBG was still in the low 60s and she was sent to the ED for evaluation.     Patient has no complaints herself.  Patient has been evaluated by GI numerous times in the hospital and receives long-acting octreotide  injections and blood transfusions.   ED Course: Upon arrival to the ED, patient is found to be afebrile and saturating well on room air with stable BP.  Labs are most notable for potassium 2.5, albumin  2.9, and hemoglobin 6.5.   Patient was treated with IV potassium and had 2 units RBC ordered for transfusion from the ED.  As of 01/08/24 patient remains largely nonverbal and unresponsive (since 01/05/2024)--despite interventions including hemodialysis on 01/05/2024 and hemodialysis on 01/08/2024, lactulose enemas and lactulose via NG tube with multiple (>10)  BMs---serum ammonia is only 46--patient remains unresponsive and nonverbal and unable to take oral intake--with recurrent episodes of hypoglycemia treated with initially dextrose  and then Ensure supplements via NG tube -- After Further conversations with patient's husband at bedside and daughter Shawna [name] on speaker phone on 01/08/24--family decided to transition to full comfort care given  overall poor prognosis. -- Patient transitioned to full comfort care on 01/08/2024 at family's request   Assessment and Plan:  Symptomatic acute on chronic anemia --suspect acute blood loss anemia superimposed on chronic anemia due to ESRD and ongoing chronic GI blood loss -Hemoglobin at time of admission 6.5 - Prior history of AVMs; over a year without endoscopic evaluation.  Receiving octreotide  as an outpatient. - Status post EGD on 01/04/2024 with findings of Normal esophagus.  - Gastritis,  Six non- bleeding angioectasias in the duodenum. Treated with argon plasma coagulation ( APC) . - Non- bleeding jejunal diverticulum.  -- Hgb now greater than 9 after transfusion of 2 units of PRBCs this admission Per Dr. Pearlean notation; As of 01/08/24 patient remained largely nonverbal and unresponsive (since 01/05/2024)--despite interventions including hemodialysis on 01/05/2024 and hemodialysis on 01/08/2024, lactulose enemas and lactulose via NG tube with multiple (>10)  BMs---serum ammonia is only 46--patient remains unresponsive and nonverbal and unable to take oral intake--with recurrent episodes of hypoglycemia treated with initially dextrose  and then Ensure supplements via NG tube --. After Further conversations with patient's husband at bedside and daughter Shawna [name] on speaker phone on 01/08/24--family has decided to transition to full comfort care given overall poor prognosis. - Patient transitioned to full comfort care on 01/08/2024 at family's request  I had another conversation with daughter and grandaughter at bedside today and reaffirmed that we have run out of medical options and have no reasonable expectation of a meaningful recovery and the recommendation is to move forward with full comfort measures. They verbalized understanding and they are meeting with [facility] hospice.  ESRD - Monday, Wednesday and Friday (regular schedule). - Tolerated dialysis treatment well on 01/03/2024 and again on  01/05/2024 and 01/08/24 - Nephrology consult appreciated - I spoke with renal care team and they informed me that patient no longer candidate for HD and agreeable to comfort measures - Patient transitioned to full comfort care on 01/08/2024 at family's request   DM2-with renal complications  - Episodes of recurrent hypoglycemia due to poor oral intake  Patient transitioned to full comfort care on 01/08/2024 at family's request   social/ethics--- plan of care, advanced directives and goals of care discussed with patient's husband and patient's daughter Shawna Shawna [name]----and [name] -they confirm DNR DNI status  Patient transitioned to full comfort care on 01/08/2024 at family's request   FTT/recurrent hypoglycemic episodes--- due to poor oral intake in the setting of worsening lethargy  Patient transitioned to full comfort care on 01/08/2024 at family's request   coronary artery disease/hypertension - No anginal symptoms  Patient transitioned to full comfort care on 01/08/2024 at family's request   history of seizure -No recent seizures  Patient transitioned to full comfort care on 01/08/2024 at family's request   hypothyroidism  Patient transitioned to full comfort care on 01/08/2024 at family's request   GERD - Patient allergic to PPI  Patient transitioned to full comfort care on 01/08/2024 at family's request   acute metabolic encephalopathy--elevated ammonia noted -Mentation did not improve after hemodialysis session - Poor oral intake  Patient transitioned to full comfort care on 01/08/2024 at family's request   Paroxysmal atrial fibrillation - Not a candidate for anticoagulation therapy with ongoing history of GI bleed.  Patient transitioned to full comfort care on 01/08/2024 at family's request   DVT prophylaxis: full comfort care  Code Status: DNR  Family Communication: meeting with daughter, granddaughter Disposition: anticipate transition to GIP care and  residential hospice    Consultants:   Procedures:   Antimicrobials:    Subjective: Pt nonverbal resting comfortably, scratching and complaining of itching at times  Objective: Vitals:   01/08/24 1334 01/08/24 1412 01/09/24 0026 01/10/24 0502  BP: (!) 156/54 (!) 168/70 (!) 113/43 (!) 128/41  Pulse: (!) 56 63 65 67  Resp: 19 16 16    Temp: 98.2 F (36.8 C)   98.8 F (37.1 C)  TempSrc: Axillary   Oral  SpO2: 100% 97% 99% 90%  Weight: 44.7 kg     Height:        Intake/Output Summary (Last 24 hours) at 01/10/2024 1716 Last data filed at 01/10/2024 0500 Gross per 24 hour  Intake 0 ml  Output --  Net 0 ml   Filed Weights   01/08/24 0424 01/08/24 0941 01/08/24 1334  Weight: 45.9 kg 45.8 kg 44.7 kg   Examination:  General exam: frail, chronically ill appearing female, Appears calm and comfortable  Respiratory system: no increased work of breathing. Cardiovascular system: normal S1 & S2 heard. No pedal edema. Gastrointestinal system: Abdomen is nondistended. Hypoactive BS Central nervous system: somnolent, appears to have left sided facial droop. Extremities: no IV in place.  Skin: age related changes;. Psychiatry: unable to assess due to current state/condition  Data Reviewed: I have personally reviewed following labs and imaging studies  CBC: Recent Labs  Lab 01/04/24 0411 01/05/24 0321 01/08/24 0924 01/08/24 1030  WBC 6.7 7.1 8.6 8.1  HGB 12.6 13.6 9.9* 9.5*  HCT 38.3 41.7 30.9* 28.2*  MCV 93.2 94.3 94.5 96.6  PLT 182 179 216 202  Basic Metabolic Panel: Recent Labs  Lab 01/04/24 1026 01/05/24 0321 01/07/24 0415 01/08/24 0924 01/08/24 1030  NA 136 137 134* 139 138  K 3.8 4.2 3.5 4.2 3.9  CL 99 99 98 100 99  CO2 25 21* 20* 23 23  GLUCOSE 82 126* 224* 126* 124*  BUN 27* 33* 47* 74* 73*  CREATININE 3.44* 4.10* 5.35* 7.50* 7.28*  CALCIUM  8.5* 9.2 8.5* 8.8* 8.6*  MG  --  1.9  --   --   --   PHOS  --  3.4  --   --  4.2    CBG: Recent Labs  Lab  01/07/24 1113 01/07/24 1720 01/07/24 2112 01/08/24 0714 01/08/24 1628  GLUCAP 104* 85 105* 129* 78    Recent Results (from the past 240 hours)  MRSA Next Gen by PCR, Nasal     Status: None   Collection Time: 01/01/24 11:54 PM   Specimen: Nasal Mucosa; Nasal Swab  Result Value Ref Range Status   MRSA by PCR Next Gen NOT DETECTED NOT DETECTED Final    Comment: (NOTE) The GeneXpert MRSA Assay (FDA approved for NASAL specimens only), is one component of a comprehensive MRSA colonization surveillance program. It is not intended to diagnose MRSA infection nor to guide or monitor treatment for MRSA infections. Test performance is not FDA approved in patients less than 37 years old. Performed at [facility], [address], [place], [place] [address]      Radiology Studies: No results found.  Scheduled Meds:  scopolamine  1 patch Transdermal Q72H   Continuous Infusions:   LOS: 4 days   Time spent: 50 mins  Deema Juncaj Vicci, MD How to contact the [facility] Attending or Consulting provider 7A - 7P or covering provider during after hours 7P -7A, for this patient?  Check the care team in [facility] and look for a) attending/consulting TR[facility] provider listed and b) the TRH team listed Log into [email or web address] to find provider on call.  Locate the TR[facility] provider you are looking for under [facility] and page to a number that you can be directly reached. If you still have difficulty reaching the provider, please page the [facility] (Director on Call) for the Hospitalists listed on amion for assistance.  01/10/2024, 5:16 PM

## 2024-01-10 NOTE — Plan of Care (Signed)
  Problem: Education: Goal: Knowledge of General Education information will improve Description: Including pain rating scale, medication(s)/side effects and non-pharmacologic comfort measures Outcome: Not Progressing   Problem: Health Behavior/Discharge Planning: Goal: Ability to manage health-related needs will improve Outcome: Not Progressing   Problem: Activity: Goal: Risk for activity intolerance will decrease Outcome: Not Progressing   Problem: Nutrition: Goal: Adequate nutrition will be maintained Outcome: Not Progressing   Problem: Pain Managment: Goal: General experience of comfort will improve and/or be controlled Outcome: Progressing

## 2024-01-10 NOTE — TOC Progression Note (Signed)
 Transition of Care [facility]) - Progression Note    Patient Details  Name: Shawna [name] MRN: [id number] Date of Birth: [birth year]  [facility] CM/SW Contact  Mcarthur Saddie Kim, [place] Phone Number: 01/10/2024, 3:05 PM  Clinical Narrative:  Hospice RN assessed pt this morning and pt is appropriate for [facility], but no beds available. Hospice discussed GIP with family. Per hospice, they will discuss this afternoon with pt's husband who wants to speak to MD prior to making decision. MD aware. TOC will follow.      Expected Discharge Plan:  (to be determined) Barriers to Discharge: Continued Medical Work up  Expected Discharge Plan and Services In-house Referral: Clinical Social Work     Living arrangements for the past 2 months: Single Family Home Expected Discharge Date: 01/05/24                                     Social Determinants of Health (SDOH) Interventions SDOH Screenings   Food Insecurity: No Food Insecurity (01/02/2024)  Housing: Low Risk  (01/02/2024)  Transportation Needs: No Transportation Needs (01/02/2024)  Utilities: Not At Risk (01/02/2024)  Recent Concern: Utilities - At Risk (11/04/2023)  Financial Resource Strain: Low Risk  (12/14/2018)  Physical Activity: Unknown (12/14/2018)  Social Connections: Socially Integrated (01/02/2024)  Stress: No Stress Concern Present (12/14/2018)  Tobacco Use: Low Risk  (01/04/2024)  Health Literacy: Adequate Health Literacy (08/01/2023)   Received from [facility]  Recent Concern: Health Literacy - Inadequate Health Literacy (07/20/2023)   Received from [facility]    Readmission Risk Interventions    01/08/2024   10:38 AM 11/06/2023   10:21 AM 09/10/2023    9:23 AM  Readmission Risk Prevention Plan  Transportation Screening Complete Complete Complete  Medication Review Oceanographer) Complete Complete Complete  PCP or Specialist appointment within 3-5 days of discharge   Complete  HRI or Home  Care Consult Complete Complete Complete  SW Recovery Care/Counseling Consult Complete Complete Complete  Palliative Care Screening -- Not Applicable Not Applicable  Skilled Nursing Facility Not Applicable Not Applicable Not Applicable

## 2024-01-10 NOTE — Hospital Course (Addendum)
 83 y.o. female with medical history significant for AVMs with recurrent GI bleeding, chronic HFpEF, CAD status post CABG, PAF not anticoagulated, colon cancer status postresection, type 2 diabetes mellitus, and ESRD on hemodialysis who presents with low blood glucose.   Patient was reportedly in her usual state of health today at home, ate breakfast, and went to dialysis where she reportedly completed her session.  She was noted to be somnolent following the treatment and CBG was in the low 60s.  She was given food and drink but repeat CBG was still in the low 60s and she was sent to the ED for evaluation.     Patient has no complaints herself.  Patient has been evaluated by GI numerous times in the hospital and receives long-acting octreotide  injections and blood transfusions.   ED Course: Upon arrival to the ED, patient is found to be afebrile and saturating well on room air with stable BP.  Labs are most notable for potassium 2.5, albumin  2.9, and hemoglobin 6.5.   Patient was treated with IV potassium and had 2 units RBC ordered for transfusion from the ED.  As of 01/08/24 patient remains largely nonverbal and unresponsive (since 01/05/2024)--despite interventions including hemodialysis on 01/05/2024 and hemodialysis on 01/08/2024, lactulose enemas and lactulose via NG tube with multiple (>10)  BMs---serum ammonia is only 46--patient remains unresponsive and nonverbal and unable to take oral intake--with recurrent episodes of hypoglycemia treated with initially dextrose  and then Ensure supplements via NG tube -- After Further conversations with patient's husband at bedside and daughter [name] on speaker phone on 01/08/24--family decided to transition to full comfort care given overall poor prognosis. -- Patient transitioned to full comfort care on 01/08/2024 at family's request

## 2024-01-11 ENCOUNTER — Inpatient Hospital Stay (HOSPITAL_COMMUNITY)
Admission: EM | Admit: 2024-01-11 | Discharge: 2024-01-15 | DRG: 951 | Disposition: A | Discharge status: Hospice | Source: Ambulatory Visit | Attending: Family Medicine | Admitting: Family Medicine

## 2024-01-11 DIAGNOSIS — Z951 Presence of aortocoronary bypass graft: Secondary | ICD-10-CM

## 2024-01-11 DIAGNOSIS — N186 End stage renal disease: Secondary | ICD-10-CM | POA: Diagnosis present

## 2024-01-11 DIAGNOSIS — E1122 Type 2 diabetes mellitus with diabetic chronic kidney disease: Secondary | ICD-10-CM | POA: Diagnosis present

## 2024-01-11 DIAGNOSIS — K219 Gastro-esophageal reflux disease without esophagitis: Secondary | ICD-10-CM | POA: Diagnosis present

## 2024-01-11 DIAGNOSIS — I480 Paroxysmal atrial fibrillation: Secondary | ICD-10-CM | POA: Diagnosis present

## 2024-01-11 DIAGNOSIS — G9341 Metabolic encephalopathy: Secondary | ICD-10-CM | POA: Diagnosis present

## 2024-01-11 DIAGNOSIS — E7220 Disorder of urea cycle metabolism, unspecified: Secondary | ICD-10-CM | POA: Diagnosis present

## 2024-01-11 DIAGNOSIS — Z515 Encounter for palliative care: Secondary | ICD-10-CM | POA: Diagnosis not present

## 2024-01-11 DIAGNOSIS — Z66 Do not resuscitate: Secondary | ICD-10-CM | POA: Diagnosis present

## 2024-01-11 DIAGNOSIS — I132 Hypertensive heart and chronic kidney disease with heart failure and with stage 5 chronic kidney disease, or end stage renal disease: Secondary | ICD-10-CM | POA: Diagnosis present

## 2024-01-11 DIAGNOSIS — E11649 Type 2 diabetes mellitus with hypoglycemia without coma: Secondary | ICD-10-CM | POA: Diagnosis present

## 2024-01-11 DIAGNOSIS — I2510 Atherosclerotic heart disease of native coronary artery without angina pectoris: Secondary | ICD-10-CM | POA: Diagnosis present

## 2024-01-11 DIAGNOSIS — K31811 Angiodysplasia of stomach and duodenum with bleeding: Secondary | ICD-10-CM | POA: Diagnosis present

## 2024-01-11 DIAGNOSIS — I5032 Chronic diastolic (congestive) heart failure: Secondary | ICD-10-CM | POA: Diagnosis present

## 2024-01-11 DIAGNOSIS — E039 Hypothyroidism, unspecified: Secondary | ICD-10-CM | POA: Diagnosis present

## 2024-01-11 DIAGNOSIS — D649 Anemia, unspecified: Secondary | ICD-10-CM | POA: Diagnosis not present

## 2024-01-11 DIAGNOSIS — Z992 Dependence on renal dialysis: Secondary | ICD-10-CM

## 2024-01-11 DIAGNOSIS — E876 Hypokalemia: Secondary | ICD-10-CM | POA: Diagnosis not present

## 2024-01-11 DIAGNOSIS — R627 Adult failure to thrive: Secondary | ICD-10-CM | POA: Diagnosis present

## 2024-01-11 MED ORDER — ALBUTEROL SULFATE (2.5 MG/3ML) 0.083% IN NEBU: 2.5000 mg | INHALATION_SOLUTION | RESPIRATORY_TRACT | Status: DC | PRN

## 2024-01-11 MED ORDER — LORAZEPAM 1 MG PO TABS
1.0000 mg | ORAL_TABLET | ORAL | Status: DC | PRN
  Administered 2024-01-11: 1 mg via ORAL
  Filled 2024-01-11: qty 1

## 2024-01-11 MED ORDER — DIPHENHYDRAMINE-ZINC ACETATE 2-0.1 % EX CREA: TOPICAL_CREAM | Freq: Three times a day (TID) | CUTANEOUS | Status: DC | PRN

## 2024-01-11 MED ORDER — POLYVINYL ALCOHOL 1.4 % OP SOLN: 1.0000 [drp] | Freq: Four times a day (QID) | OPHTHALMIC | Status: DC | PRN

## 2024-01-11 MED ORDER — MORPHINE SULFATE (CONCENTRATE) 10 MG /0.5 ML PO SOLN: 5.0000 mg | ORAL | Status: DC | PRN

## 2024-01-11 MED ORDER — ACETAMINOPHEN 650 MG RE SUPP
RECTAL | Status: AC
  Administered 2024-01-11: 625 mg
  Filled 2024-01-11: qty 1

## 2024-01-11 MED ORDER — BIOTENE DRY MOUTH MT LIQD: 15.0000 mL | OROMUCOSAL | Status: DC | PRN

## 2024-01-11 MED ORDER — ONDANSETRON HCL 4 MG/2ML IJ SOLN: 4.0000 mg | Freq: Four times a day (QID) | INTRAMUSCULAR | Status: DC | PRN

## 2024-01-11 MED ORDER — ONDANSETRON 4 MG PO TBDP: 4.0000 mg | ORAL_TABLET | Freq: Four times a day (QID) | ORAL | Status: DC | PRN

## 2024-01-11 MED ORDER — ATROPINE SULFATE 1 % OP SOLN: 4.0000 [drp] | OPHTHALMIC | Status: DC | PRN

## 2024-01-11 MED ORDER — LORAZEPAM 2 MG/ML PO CONC: 1.0000 mg | ORAL | Status: DC | PRN

## 2024-01-11 MED ORDER — BISACODYL 10 MG RE SUPP: 10.0000 mg | Freq: Every day | RECTAL | Status: DC | PRN

## 2024-01-11 MED ORDER — OLANZAPINE 5 MG PO TBDP: 5.0000 mg | ORAL_TABLET | Freq: Every day | ORAL | Status: DC | PRN

## 2024-01-11 MED ORDER — LORAZEPAM 2 MG/ML PO CONC: 0.5000 mg | Freq: Four times a day (QID) | ORAL | Status: DC | PRN

## 2024-01-11 NOTE — Progress Notes (Signed)
   01/11/24 1354  Spiritual Encounters  Type of Visit Initial  Care provided to: Patient;Family  Referral source IDT Rounds  Reason for visit End-of-life  OnCall Visit No  Spiritual Framework  Presenting Themes Courage hope and growth;Rituals and practive;Significant life change  Community/Connection Family  Patient Stress Factors None identified  Family Stress Factors Major life changes;Loss  Interventions  Spiritual Care Interventions Made Established relationship of care and support;Compassionate presence;Reflective listening;Narrative/life review;Meaning making;Prayer;Supported grief process  Intervention Outcomes  Outcomes Connection to spiritual care;Awareness of support;Patient family open to resources  Spiritual Care Plan  Spiritual Care Issues Still Outstanding Elia will continue to follow   Referred to patient via IDT rounds. Found Nikky laying peacefully in her bed with eyes closed. No signs of discomfort. Her daughter, niece, and grand daughter were present bedside. Chaplain engaged family in reflection around Hildreth's life, spiritual heritage, and illness story. Azarya and her husband were in a quartet at church and the family had music playing in the room today. Chaplain provided space for life review and will remain available in order to provide spiritual support and to assess for spiritual need.   Ether Blush, M.Div Chaplain

## 2024-01-11 NOTE — Discharge Summary (Addendum)
 DISCHARGE SUMMARY   Shawna [name]  FMW:[id number] DOB: 1940/10/19 DOA: 01/01/2024 PCP: [facility]   Chief Complaint  Patient presents with   Hypoglycemia   Level of care: Med-Surg  Brief Admission History:  83 y.o. female with medical history significant for AVMs with recurrent GI bleeding, chronic HFpEF, CAD status post CABG, PAF not anticoagulated, colon cancer status postresection, type 2 diabetes mellitus, and ESRD on hemodialysis who presents with low blood glucose.   Patient was reportedly in her usual state of health today at home, ate breakfast, and went to dialysis where she reportedly completed her session.  She was noted to be somnolent following the treatment and CBG was in the low 60s.  She was given food and drink but repeat CBG was still in the low 60s and she was sent to the ED for evaluation.     Patient has no complaints herself.  Patient has been evaluated by GI numerous times in the hospital and receives long-acting octreotide  injections and blood transfusions.   ED Course: Upon arrival to the ED, patient is found to be afebrile and saturating well on room air with stable BP.  Labs are most notable for potassium 2.5, albumin  2.9, and hemoglobin 6.5.   Patient was treated with IV potassium and had 2 units RBC ordered for transfusion from the ED.  As of 01/08/24 patient remains largely nonverbal and unresponsive (since 01/05/2024)--despite interventions including hemodialysis on 01/05/2024 and hemodialysis on 01/08/2024, lactulose enemas and lactulose via NG tube with multiple (>10)  BMs---serum ammonia is only 46--patient remains unresponsive and nonverbal and unable to take oral intake--with recurrent episodes of hypoglycemia treated with initially dextrose  and then Ensure supplements via NG tube -- After Further conversations with patient's husband at bedside and daughter [name] on speaker phone on 01/08/24--family decided to transition to full comfort care  given overall poor prognosis. -- Patient transitioned to full comfort care on 01/08/2024 at family's request   Assessment and Plan:  Symptomatic acute on chronic anemia --suspect acute blood loss anemia superimposed on chronic anemia due to ESRD and ongoing chronic GI blood loss -Hemoglobin at time of admission 6.5 - Prior history of AVMs; over a year without endoscopic evaluation.  Receiving octreotide  as an outpatient. - Status post EGD on 01/04/2024 with findings of Normal esophagus.  - Gastritis,  Six non- bleeding angioectasias in the duodenum. Treated with argon plasma coagulation ( APC) . - Non- bleeding jejunal diverticulum.  -- Hgb now greater than 9 after transfusion of 2 units of PRBCs this admission Per Dr. Pearlean notation; As of 01/08/24 patient remained largely nonverbal and unresponsive (since 01/05/2024)--despite interventions including hemodialysis on 01/05/2024 and hemodialysis on 01/08/2024, lactulose enemas and lactulose via NG tube with multiple (>10)  BMs---serum ammonia is only 46--patient remains unresponsive and nonverbal and unable to take oral intake--with recurrent episodes of hypoglycemia treated with initially dextrose  and then Ensure supplements via NG tube --. After Further conversations with patient's husband at bedside and daughter [name] on speaker phone on 01/08/24--family has decided to transition to full comfort care given overall poor prognosis. - Patient transitioned to full comfort care on 01/08/2024 at family's request  I had another conversation with daughter and grandaughter at bedside 7/2 and reaffirmed that we have run out of medical options and have no reasonable expectation of a meaningful recovery and the recommendation is to move forward with full comfort measures. They verbalized understanding and they are meeting with [facility] hospice.  ESRD - Monday, Wednesday and Friday (regular schedule). - Tolerated dialysis treatment well on 01/03/2024 and again  on 01/05/2024 and 01/08/24 - Nephrology consult appreciated - I spoke with renal care team and they informed me that patient no longer candidate for HD and agreeable to comfort measures - Patient transitioned to full comfort care on 01/08/2024 at family's request   DM2-with renal complications  - Episodes of recurrent hypoglycemia due to poor oral intake  Patient transitioned to full comfort care on 01/08/2024 at family's request   social/ethics--- plan of care, advanced directives and goals of care discussed with patient's husband and patient's daughter [name]----and granddaughter [name] -they confirm DNR DNI status  Patient transitioned to full comfort care on 01/08/2024 at family's request   FTT/recurrent hypoglycemic episodes--- due to poor oral intake in the setting of worsening lethargy  Patient transitioned to full comfort care on 01/08/2024 at family's request   coronary artery disease/hypertension - No anginal symptoms  Patient transitioned to full comfort care on 01/08/2024 at family's request   history of seizure -No recent seizures  Patient transitioned to full comfort care on 01/08/2024 at family's request   hypothyroidism  Patient transitioned to full comfort care on 01/08/2024 at family's request   GERD - Patient allergic to PPI  Patient transitioned to full comfort care on 01/08/2024 at family's request   acute metabolic encephalopathy--elevated ammonia noted -Mentation did not improve after hemodialysis session - Poor oral intake  Patient transitioned to full comfort care on 01/08/2024 at family's request   Paroxysmal atrial fibrillation - Not a candidate for anticoagulation therapy with ongoing history of GI bleed.  Patient transitioned to full comfort care on 01/08/2024 at family's request   DVT prophylaxis: full comfort care  Code Status: DNR  Family Communication: meeting with daughter, granddaughter Disposition: anticipate transition to GIP care and  residential hospice    Consultants:   Procedures:   Antimicrobials:    Subjective: Pt nonverbal resting comfortably  Objective: Vitals:   01/08/24 1412 01/09/24 0026 01/10/24 0502 01/10/24 2058  BP: (!) 168/70 (!) 113/43 (!) 128/41 (!) 106/48  Pulse: 63 65 67 68  Resp: 16 16  16   Temp:   98.8 F (37.1 C)   TempSrc:   Oral   SpO2: 97% 99% 90% 95%  Weight:      Height:       No intake or output data in the 24 hours ending 01/11/24 1614  Filed Weights   01/08/24 0424 01/08/24 0941 01/08/24 1334  Weight: 45.9 kg 45.8 kg 44.7 kg   Examination:  General exam: frail, chronically ill appearing female, Appears calm and comfortable  Respiratory system: no increased work of breathing. Cardiovascular system: normal S1 & S2 heard. No pedal edema. Gastrointestinal system: Abdomen is nondistended. Hypoactive BS Central nervous system: somnolent, appears to have left sided facial droop. Extremities: no IV in place.  Skin: age related changes;. Psychiatry: unable to assess due to current state/condition  Data Reviewed: I have personally reviewed following labs and imaging studies  CBC: Recent Labs  Lab 01/05/24 0321 01/08/24 0924 01/08/24 1030  WBC 7.1 8.6 8.1  HGB 13.6 9.9* 9.5*  HCT 41.7 30.9* 28.2*  MCV 94.3 94.5 96.6  PLT 179 216 202    Basic Metabolic Panel: Recent Labs  Lab 01/05/24 0321 01/07/24 0415 01/08/24 0924 01/08/24 1030  NA 137 134* 139 138  K 4.2 3.5 4.2 3.9  CL 99 98 100 99  CO2 21* 20* 23  23  GLUCOSE 126* 224* 126* 124*  BUN 33* 47* 74* 73*  CREATININE 4.10* 5.35* 7.50* 7.28*  CALCIUM  9.2 8.5* 8.8* 8.6*  MG 1.9  --   --   --   PHOS 3.4  --   --  4.2    CBG: Recent Labs  Lab 01/07/24 1113 01/07/24 1720 01/07/24 2112 01/08/24 0714 01/08/24 1628  GLUCAP 104* 85 105* 129* 78    Recent Results (from the past 240 hours)  MRSA Next Gen by PCR, Nasal     Status: None   Collection Time: 01/01/24 11:54 PM   Specimen: Nasal Mucosa; Nasal  Swab  Result Value Ref Range Status   MRSA by PCR Next Gen NOT DETECTED NOT DETECTED Final    Comment: (NOTE) The GeneXpert MRSA Assay (FDA approved for NASAL specimens only), is one component of a comprehensive MRSA colonization surveillance program. It is not intended to diagnose MRSA infection nor to guide or monitor treatment for MRSA infections. Test performance is not FDA approved in patients less than 38 years old. Performed at [facility], [address] [place], [place] [address]      Radiology Studies: No results found.  Scheduled Meds:  scopolamine  1 patch Transdermal Q72H   Continuous Infusions:   LOS: 5 days   Time spent: 50 mins  Ryn Peine Vicci, MD How to contact the [facility] Attending or Consulting provider 7A - 7P or covering provider during after hours 7P -7A, for this patient?  Check the care team in [facility] and look for a) attending/consulting TRH provider listed and b) the TRH team listed Log into [email or web address] to find provider on call.  Locate the TRH provider you are looking for under [facility] and page to a number that you can be directly reached. If you still have difficulty reaching the provider, please page the [facility] (Director on Call) for the Hospitalists listed on amion for assistance.  01/11/2024, 4:14 PM

## 2024-01-11 NOTE — Plan of Care (Signed)
  Problem: Clinical Measurements: Goal: Diagnostic test results will improve Outcome: Not Progressing Goal: Respiratory complications will improve Outcome: Not Progressing Goal: Cardiovascular complication will be avoided Outcome: Not Progressing   Problem: Activity: Goal: Risk for activity intolerance will decrease Outcome: Not Progressing   Problem: Nutrition: Goal: Adequate nutrition will be maintained Outcome: Not Progressing   Problem: Coping: Goal: Level of anxiety will decrease Outcome: Not Progressing   Problem: Elimination: Goal: Will not experience complications related to bowel motility Outcome: Not Progressing Goal: Will not experience complications related to urinary retention Outcome: Not Progressing   Problem: Pain Managment: Goal: General experience of comfort will improve and/or be controlled Outcome: Not Progressing

## 2024-01-11 NOTE — Progress Notes (Addendum)
 PROGRESS NOTE   Shawna [name]  FMW:[id number] DOB: 1941/03/28 DOA: 01/01/2024 PCP: [facility]   Chief Complaint  Patient presents with   Hypoglycemia   Level of care: Med-Surg  Brief Admission History:  83 y.o. female with medical history significant for AVMs with recurrent GI bleeding, chronic HFpEF, CAD status post CABG, PAF not anticoagulated, colon cancer status postresection, type 2 diabetes mellitus, and ESRD on hemodialysis who presents with low blood glucose.   Patient was reportedly in her usual state of health today at home, ate breakfast, and went to dialysis where she reportedly completed her session.  She was noted to be somnolent following the treatment and CBG was in the low 60s.  She was given food and drink but repeat CBG was still in the low 60s and she was sent to the ED for evaluation.     Patient has no complaints herself.  Patient has been evaluated by GI numerous times in the hospital and receives long-acting octreotide  injections and blood transfusions.   ED Course: Upon arrival to the ED, patient is found to be afebrile and saturating well on room air with stable BP.  Labs are most notable for potassium 2.5, albumin  2.9, and hemoglobin 6.5.   Patient was treated with IV potassium and had 2 units RBC ordered for transfusion from the ED.  As of 01/08/24 patient remains largely nonverbal and unresponsive (since 01/05/2024)--despite interventions including hemodialysis on 01/05/2024 and hemodialysis on 01/08/2024, lactulose enemas and lactulose via NG tube with multiple (>10)  BMs---serum ammonia is only 46--patient remains unresponsive and nonverbal and unable to take oral intake--with recurrent episodes of hypoglycemia treated with initially dextrose  and then Ensure supplements via NG tube -- After Further conversations with patient's husband at bedside and daughter [name] on speaker phone on 01/08/24--family decided to transition to full comfort care given  overall poor prognosis. -- Patient transitioned to full comfort care on 01/08/2024 at family's request   Assessment and Plan:  Symptomatic acute on chronic anemia --suspect acute blood loss anemia superimposed on chronic anemia due to ESRD and ongoing chronic GI blood loss -Hemoglobin at time of admission 6.5 - Prior history of AVMs; over a year without endoscopic evaluation.  Receiving octreotide  as an outpatient. - Status post EGD on 01/04/2024 with findings of Normal esophagus.  - Gastritis,  Six non- bleeding angioectasias in the duodenum. Treated with argon plasma coagulation ( APC) . - Non- bleeding jejunal diverticulum.  -- Hgb now greater than 9 after transfusion of 2 units of PRBCs this admission Per Dr. Pearlean notation; As of 01/08/24 patient remained largely nonverbal and unresponsive (since 01/05/2024)--despite interventions including hemodialysis on 01/05/2024 and hemodialysis on 01/08/2024, lactulose enemas and lactulose via NG tube with multiple (>10)  BMs---serum ammonia is only 46--patient remains unresponsive and nonverbal and unable to take oral intake--with recurrent episodes of hypoglycemia treated with initially dextrose  and then Ensure supplements via NG tube --. After Further conversations with patient's husband at bedside and daughter [name] on speaker phone on 01/08/24--family has decided to transition to full comfort care given overall poor prognosis. - Patient transitioned to full comfort care on 01/08/2024 at family's request  I had another conversation with daughter and grandaughter at bedside 7/2 and reaffirmed that we have run out of medical options and have no reasonable expectation of a meaningful recovery and the recommendation is to move forward with full comfort measures. They verbalized understanding and they are meeting with [facility] hospice.  ESRD - Monday, Wednesday and Friday (regular schedule). - Tolerated dialysis treatment well on 01/03/2024 and again on  01/05/2024 and 01/08/24 - Nephrology consult appreciated - I spoke with renal care team and they informed me that patient no longer candidate for HD and agreeable to comfort measures - Patient transitioned to full comfort care on 01/08/2024 at family's request   DM2-with renal complications  - Episodes of recurrent hypoglycemia due to poor oral intake  Patient transitioned to full comfort care on 01/08/2024 at family's request   social/ethics--- plan of care, advanced directives and goals of care discussed with patient's husband and patient's daughter [name]----and granddaughter [name] -they confirm DNR DNI status  Patient transitioned to full comfort care on 01/08/2024 at family's request   FTT/recurrent hypoglycemic episodes--- due to poor oral intake in the setting of worsening lethargy  Patient transitioned to full comfort care on 01/08/2024 at family's request   coronary artery disease/hypertension - No anginal symptoms  Patient transitioned to full comfort care on 01/08/2024 at family's request   history of seizure -No recent seizures  Patient transitioned to full comfort care on 01/08/2024 at family's request   hypothyroidism  Patient transitioned to full comfort care on 01/08/2024 at family's request   GERD - Patient allergic to PPI  Patient transitioned to full comfort care on 01/08/2024 at family's request   acute metabolic encephalopathy--elevated ammonia noted -Mentation did not improve after hemodialysis session - Poor oral intake  Patient transitioned to full comfort care on 01/08/2024 at family's request   Paroxysmal atrial fibrillation - Not a candidate for anticoagulation therapy with ongoing history of GI bleed.  Patient transitioned to full comfort care on 01/08/2024 at family's request   DVT prophylaxis: full comfort care  Code Status: DNR  Family Communication: meeting with daughter, granddaughter Disposition: anticipate transition to GIP care and  residential hospice    Consultants:   Procedures:   Antimicrobials:    Subjective: Pt nonverbal resting comfortably  Objective: Vitals:   01/08/24 1412 01/09/24 0026 01/10/24 0502 01/10/24 2058  BP: (!) 168/70 (!) 113/43 (!) 128/41 (!) 106/48  Pulse: 63 65 67 68  Resp: 16 16  16   Temp:   98.8 F (37.1 C)   TempSrc:   Oral   SpO2: 97% 99% 90% 95%  Weight:      Height:       No intake or output data in the 24 hours ending 01/11/24 1614  Filed Weights   01/08/24 0424 01/08/24 0941 01/08/24 1334  Weight: 45.9 kg 45.8 kg 44.7 kg   Examination:  General exam: frail, chronically ill appearing female, Appears calm and comfortable  Respiratory system: no increased work of breathing. Cardiovascular system: normal S1 & S2 heard. No pedal edema. Gastrointestinal system: Abdomen is nondistended. Hypoactive BS Central nervous system: somnolent, appears to have left sided facial droop. Extremities: no IV in place.  Skin: age related changes;. Psychiatry: unable to assess due to current state/condition  Data Reviewed: I have personally reviewed following labs and imaging studies  CBC: Recent Labs  Lab 01/05/24 0321 01/08/24 0924 01/08/24 1030  WBC 7.1 8.6 8.1  HGB 13.6 9.9* 9.5*  HCT 41.7 30.9* 28.2*  MCV 94.3 94.5 96.6  PLT 179 216 202    Basic Metabolic Panel: Recent Labs  Lab 01/05/24 0321 01/07/24 0415 01/08/24 0924 01/08/24 1030  NA 137 134* 139 138  K 4.2 3.5 4.2 3.9  CL 99 98 100 99  CO2 21* 20* 23  23  GLUCOSE 126* 224* 126* 124*  BUN 33* 47* 74* 73*  CREATININE 4.10* 5.35* 7.50* 7.28*  CALCIUM  9.2 8.5* 8.8* 8.6*  MG 1.9  --   --   --   PHOS 3.4  --   --  4.2    CBG: Recent Labs  Lab 01/07/24 1113 01/07/24 1720 01/07/24 2112 01/08/24 0714 01/08/24 1628  GLUCAP 104* 85 105* 129* 78    Recent Results (from the past 240 hours)  MRSA Next Gen by PCR, Nasal     Status: None   Collection Time: 01/01/24 11:54 PM   Specimen: Nasal Mucosa; Nasal  Swab  Result Value Ref Range Status   MRSA by PCR Next Gen NOT DETECTED NOT DETECTED Final    Comment: (NOTE) The GeneXpert MRSA Assay (FDA approved for NASAL specimens only), is one component of a comprehensive MRSA colonization surveillance program. It is not intended to diagnose MRSA infection nor to guide or monitor treatment for MRSA infections. Test performance is not FDA approved in patients less than 29 years old. Performed at [facility], [address] [place], [place] [address]      Radiology Studies: No results found.  Scheduled Meds:  scopolamine  1 patch Transdermal Q72H   Continuous Infusions:   LOS: 5 days   Time spent: 50 mins  Johnpatrick Jenny Vicci, MD How to contact the [facility] Attending or Consulting provider 7A - 7P or covering provider during after hours 7P -7A, for this patient?  Check the care team in [facility] and look for a) attending/consulting TRH provider listed and b) the TRH team listed Log into [email or web address] to find provider on call.  Locate the TRH provider you are looking for under [facility] and page to a number that you can be directly reached. If you still have difficulty reaching the provider, please page the [facility] (Director on Call) for the Hospitalists listed on amion for assistance.  01/11/2024, 4:14 PM

## 2024-01-11 NOTE — H&P (Signed)
 GIP ADMISSION H&P     Shawna [name]             FMW:[id number] DOB: [birth year] DOA: 01/01/2024 PCP: [facility]             Chief Complaint  Patient presents with   Hypoglycemia    Level of care: Med-Surg   Brief Admission History:  83 y.o. female with medical history significant for AVMs with recurrent GI bleeding, chronic HFpEF, CAD status post CABG, PAF not anticoagulated, colon cancer status postresection, type 2 diabetes mellitus, and ESRD on hemodialysis who presents with low blood glucose.   Patient was reportedly in her usual state of health today at home, ate breakfast, and went to dialysis where she reportedly completed her session.  She was noted to be somnolent following the treatment and CBG was in the low 60s.  She was given food and drink but repeat CBG was still in the low 60s and she was sent to the ED for evaluation.     Patient has no complaints herself.  Patient has been evaluated by GI numerous times in the hospital and receives long-acting octreotide  injections and blood transfusions.   ED Course: Upon arrival to the ED, patient is found to be afebrile and saturating well on room air with stable BP.  Labs are most notable for potassium 2.5, albumin  2.9, and hemoglobin 6.5.   Patient was treated with IV potassium and had 2 units RBC ordered for transfusion from the ED.   As of 01/08/24 patient remains largely nonverbal and unresponsive (since 01/05/2024)--despite interventions including hemodialysis on 01/05/2024 and hemodialysis on 01/08/2024, lactulose enemas and lactulose via NG tube with multiple (>10)  BMs---serum ammonia is only 46--patient remains unresponsive and nonverbal and unable to take oral intake--with recurrent episodes of hypoglycemia treated with initially dextrose  and then Ensure supplements via NG tube -- After Further conversations with patient's husband at bedside and daughter [name] on speaker phone on 01/08/24--family decided to  transition to full comfort care given overall poor prognosis. -- Patient transitioned to full comfort care on 01/08/2024 at family's request   Assessment and Plan:   Symptomatic acute on chronic anemia --suspect acute blood loss anemia superimposed on chronic anemia due to ESRD and ongoing chronic GI blood loss -Hemoglobin at time of admission 6.5 - Prior history of AVMs; over a year without endoscopic evaluation.  Receiving octreotide  as an outpatient. - Status post EGD on 01/04/2024 with findings of Normal esophagus.  - Gastritis,  Six non- bleeding angioectasias in the duodenum. Treated with argon plasma coagulation ( APC) . - Non- bleeding jejunal diverticulum.  -- Hgb now greater than 9 after transfusion of 2 units of PRBCs this admission Per Dr. Pearlean notation; As of 01/08/24 patient remained largely nonverbal and unresponsive (since 01/05/2024)--despite interventions including hemodialysis on 01/05/2024 and hemodialysis on 01/08/2024, lactulose enemas and lactulose via NG tube with multiple (>10)  BMs---serum ammonia is only 46--patient remains unresponsive and nonverbal and unable to take oral intake--with recurrent episodes of hypoglycemia treated with initially dextrose  and then Ensure supplements via NG tube --. After Further conversations with patient's husband at bedside and daughter [name] on speaker phone on 01/08/24--family has decided to transition to full comfort care given overall poor prognosis. - Patient transitioned to full comfort care on 01/08/2024 at family's request   I had another conversation with daughter and grandaughter at bedside 7/2 and reaffirmed that we have run out of medical options and have no  reasonable expectation of a meaningful recovery and the recommendation is to move forward with full comfort measures. They verbalized understanding and they are meeting with [facility] hospice.     ESRD - Monday, Wednesday and Friday (regular schedule). - Tolerated dialysis  treatment well on 01/03/2024 and again on 01/05/2024 and 01/08/24 - Nephrology consult appreciated - I spoke with renal care team and they informed me that patient no longer candidate for HD and agreeable to comfort measures - Patient transitioned to full comfort care on 01/08/2024 at family's request   DM2-with renal complications  - Episodes of recurrent hypoglycemia due to poor oral intake  Patient transitioned to full comfort care on 01/08/2024 at family's request   social/ethics--- plan of care, advanced directives and goals of care discussed with patient's husband and patient's daughter [name]----and granddaughter [name] -they confirm DNR DNI status  Patient transitioned to full comfort care on 01/08/2024 at family's request   FTT/recurrent hypoglycemic episodes--- due to poor oral intake in the setting of worsening lethargy  Patient transitioned to full comfort care on 01/08/2024 at family's request   coronary artery disease/hypertension - No anginal symptoms  Patient transitioned to full comfort care on 01/08/2024 at family's request   history of seizure -No recent seizures  Patient transitioned to full comfort care on 01/08/2024 at family's request   hypothyroidism  Patient transitioned to full comfort care on 01/08/2024 at family's request   GERD - Patient allergic to PPI  Patient transitioned to full comfort care on 01/08/2024 at family's request   acute metabolic encephalopathy--elevated ammonia noted -Mentation did not improve after hemodialysis session - Poor oral intake  Patient transitioned to full comfort care on 01/08/2024 at family's request   Paroxysmal atrial fibrillation - Not a candidate for anticoagulation therapy with ongoing history of GI bleed.  Patient transitioned to full comfort care on 01/08/2024 at family's request     DVT prophylaxis: full comfort care  Code Status: DNR  Family Communication: meeting with daughter, granddaughter Disposition:  anticipate transition to GIP care and residential hospice    Consultants:    Procedures:    Antimicrobials:    Subjective: Pt nonverbal resting comfortably   Objective:       Vitals:    01/08/24 1412 01/09/24 0026 01/10/24 0502 01/10/24 2058  BP: (!) 168/70 (!) 113/43 (!) 128/41 (!) 106/48  Pulse: 63 65 67 68  Resp: 16 16   16   Temp:     98.8 F (37.1 C)    TempSrc:     Oral    SpO2: 97% 99% 90% 95%  Weight:          Height:            No intake or output data in the 24 hours ending 01/11/24 1614        Filed Weights    01/08/24 0424 01/08/24 0941 01/08/24 1334  Weight: 45.9 kg 45.8 kg 44.7 kg    Examination:   General exam: frail, chronically ill appearing female, Appears calm and comfortable  Respiratory system: no increased work of breathing. Cardiovascular system: normal S1 & S2 heard. No pedal edema. Gastrointestinal system: Abdomen is nondistended. Hypoactive BS Central nervous system: somnolent, appears to have left sided facial droop. Extremities: no IV in place.  Skin: age related changes;. Psychiatry: unable to assess due to current state/condition   Data Reviewed: I have personally reviewed following labs and imaging studies   CBC:      Recent  Labs  Lab 01/05/24 0321 01/08/24 0924 01/08/24 1030  WBC 7.1 8.6 8.1  HGB 13.6 9.9* 9.5*  HCT 41.7 30.9* 28.2*  MCV 94.3 94.5 96.6  PLT 179 216 202      Basic Metabolic Panel:       Recent Labs  Lab 01/05/24 0321 01/07/24 0415 01/08/24 0924 01/08/24 1030  NA 137 134* 139 138  K 4.2 3.5 4.2 3.9  CL 99 98 100 99  CO2 21* 20* 23 23  GLUCOSE 126* 224* 126* 124*  BUN 33* 47* 74* 73*  CREATININE 4.10* 5.35* 7.50* 7.28*  CALCIUM  9.2 8.5* 8.8* 8.6*  MG 1.9  --   --   --   PHOS 3.4  --   --  4.2      CBG:        Recent Labs  Lab 01/07/24 1113 01/07/24 1720 01/07/24 2112 01/08/24 0714 01/08/24 1628  GLUCAP 104* 85 105* 129* 78             Recent Results (from the past 240 hours)  MRSA  Next Gen by PCR, Nasal     Status: None    Collection Time: 01/01/24 11:54 PM    Specimen: Nasal Mucosa; Nasal Swab  Result Value Ref Range Status    MRSA by PCR Next Gen NOT DETECTED NOT DETECTED Final      Comment: (NOTE) The GeneXpert MRSA Assay (FDA approved for NASAL specimens only), is one component of a comprehensive MRSA colonization surveillance program. It is not intended to diagnose MRSA infection nor to guide or monitor treatment for MRSA infections. Test performance is not FDA approved in patients less than 57 years old. Performed at [facility], [address], [place], [place] [address]        Radiology Studies: No results found.   Scheduled Meds:  scopolamine  1 patch Transdermal Q72H    Continuous Infusions:    LOS: 5 days    Time spent: 40 mins   Calley Drenning Vicci, MD How to contact the [facility] Attending or Consulting provider 7A - 7P or covering provider during after hours 7P -7A, for this patient?  Check the care team in [facility] and look for a) attending/consulting TRH provider listed and b) the TRH team listed Log into [email or web address] to find provider on call.  Locate the TRH provider you are looking for under [facility] and page to a number that you can be directly reached. If you still have difficulty reaching the provider, please page the [facility] (Director on Call) for the Hospitalists listed on amion for assistance.

## 2024-01-11 NOTE — Progress Notes (Signed)
 Pt has remained stable this shift. Denies any c/o pain when asked, no notable discomfort or anxiety. Pt sleeping but does open eyes to name called and answers yes/no questions. Continues Cheyne-stokes respiration but unlabored. Family remains at bedside, advised to call for needs, state understanding.

## 2024-01-11 NOTE — Progress Notes (Signed)
 Notified by [facility] and MD that pt will be moved into GIP status, as no beds available at the [facility].

## 2024-01-12 DIAGNOSIS — Z515 Encounter for palliative care: Secondary | ICD-10-CM | POA: Diagnosis not present

## 2024-01-12 NOTE — Progress Notes (Signed)
 PROGRESS NOTE   Shawna [name]  FMW:[id number] DOB: 09/23/1940 DOA: 01/11/2024 PCP: [facility]   No chief complaint on file.  Level of care:   Brief Admission History:  83 y.o. female with medical history significant for AVMs with recurrent GI bleeding, chronic HFpEF, CAD status post CABG, PAF not anticoagulated, colon cancer status postresection, type 2 diabetes mellitus, and ESRD on hemodialysis who presents with low blood glucose.   Patient was reportedly in her usual state of health today at home, ate breakfast, and went to dialysis where she reportedly completed her session.  She was noted to be somnolent following the treatment and CBG was in the low 60s.  She was given food and drink but repeat CBG was still in the low 60s and she was sent to the ED for evaluation.     Patient has no complaints herself.  Patient has been evaluated by GI numerous times in the hospital and receives long-acting octreotide  injections and blood transfusions.   ED Course: Upon arrival to the ED, patient is found to be afebrile and saturating well on room air with stable BP.  Labs are most notable for potassium 2.5, albumin  2.9, and hemoglobin 6.5.   Patient was treated with IV potassium and had 2 units RBC ordered for transfusion from the ED.   Assessment and Plan:  Symptomatic acute on chronic anemia --suspect acute blood loss anemia superimposed on chronic anemia due to ESRD and ongoing chronic GI blood loss -Hemoglobin at time of admission 6.5 - Prior history of AVMs; over a year without endoscopic evaluation.  Receiving octreotide  as an outpatient. - Status post EGD on 01/04/2024 with findings of Normal esophagus.  - Gastritis,  Six non- bleeding angioectasias in the duodenum. Treated with argon plasma coagulation ( APC) . - Non- bleeding jejunal diverticulum.  -- Hgb now greater than 9 after transfusion of 2 units of PRBCs this admission Per Dr. Pearlean notation; As of 01/08/24 patient  remained largely nonverbal and unresponsive (since 01/05/2024)--despite interventions including hemodialysis on 01/05/2024 and hemodialysis on 01/08/2024, lactulose  enemas and lactulose  via NG tube with multiple (>10)  BMs---serum ammonia is only 46--patient remains unresponsive and nonverbal and unable to take oral intake--with recurrent episodes of hypoglycemia treated with initially dextrose  and then Ensure supplements via NG tube --. After Further conversations with patient's husband at bedside and daughter [name] on speaker phone on 01/08/24--family has decided to transition to full comfort care given overall poor prognosis. - Patient transitioned to full comfort care on 01/08/2024 at family's request  I had another conversation with daughter and grandaughter at bedside 7/2 and reaffirmed that we have run out of medical options and have no reasonable expectation of a meaningful recovery and the recommendation is to move forward with full comfort measures. They verbalized understanding and they are meeting with [facility] hospice.     ESRD - Monday, Wednesday and Friday (regular schedule). - Tolerated dialysis treatment well on 01/03/2024 and again on 01/05/2024 and 01/08/24 - Nephrology consult appreciated - I spoke with renal care team and they informed me that patient no longer candidate for HD and agreeable to comfort measures - Patient transitioned to full comfort care on 01/08/2024 at family's request   DM2-with renal complications  - Episodes of recurrent hypoglycemia due to poor oral intake  Patient transitioned to full comfort care on 01/08/2024 at family's request   social/ethics--- plan of care, advanced directives and goals of care discussed with patient's husband and patient's  daughter [name]----and granddaughter [name] -they confirm DNR DNI status  Patient transitioned to full comfort care on 01/08/2024 at family's request   FTT/recurrent hypoglycemic episodes--- due to poor  oral intake in the setting of worsening lethargy  Patient transitioned to full comfort care on 01/08/2024 at family's request   coronary artery disease/hypertension - No anginal symptoms  Patient transitioned to full comfort care on 01/08/2024 at family's request   history of seizure -No recent seizures  Patient transitioned to full comfort care on 01/08/2024 at family's request   hypothyroidism  Patient transitioned to full comfort care on 01/08/2024 at family's request   GERD - Patient allergic to PPI  Patient transitioned to full comfort care on 01/08/2024 at family's request   acute metabolic encephalopathy--hyperammonemia -Mentation did not improve after hemodialysis session - Poor oral intake  Patient transitioned to full comfort care on 01/08/2024 at family's request   Paroxysmal atrial fibrillation - Not a candidate for anticoagulation therapy with ongoing history of GI bleed.  Patient transitioned to full comfort care on 01/08/2024 at family's request   DVT prophylaxis: full comfort care  Code Status: DNR  Family Communication: meeting with daughter, granddaughter Disposition: anticipate transition to GIP care and residential hospice    Consultants:   Procedures:   Antimicrobials:    Subjective: Pt nonverbal resting comfortably  Objective: Vitals:   01/11/24 2200 01/12/24 1100  BP:  (!) 113/37  Pulse:  76  Resp: 16 18  Temp:  97.7 F (36.5 C)  TempSrc:  Axillary  SpO2: (!) 85% 93%   No intake or output data in the 24 hours ending 01/12/24 1246  There were no vitals filed for this visit.  Examination:  General exam: frail, chronically ill appearing female, Appears calm and comfortable  Respiratory system: no increased work of breathing. Cardiovascular system: normal S1 & S2 heard. No pedal edema. Gastrointestinal system: Abdomen is nondistended. Hypoactive BS Central nervous system: somnolent, appears to have left sided facial droop. Extremities: no IV  in place.  Skin: age related changes;. Psychiatry: unable to assess due to current state/condition  Data Reviewed: I have personally reviewed following labs and imaging studies  CBC: Recent Labs  Lab 01/08/24 0924 01/08/24 1030  WBC 8.6 8.1  HGB 9.9* 9.5*  HCT 30.9* 28.2*  MCV 94.5 96.6  PLT 216 202    Basic Metabolic Panel: Recent Labs  Lab 01/07/24 0415 01/08/24 0924 01/08/24 1030  NA 134* 139 138  K 3.5 4.2 3.9  CL 98 100 99  CO2 20* 23 23  GLUCOSE 224* 126* 124*  BUN 47* 74* 73*  CREATININE 5.35* 7.50* 7.28*  CALCIUM  8.5* 8.8* 8.6*  PHOS  --   --  4.2    CBG: Recent Labs  Lab 01/07/24 1113 01/07/24 1720 01/07/24 2112 01/08/24 0714 01/08/24 1628  GLUCAP 104* 85 105* 129* 78    No results found for this or any previous visit (from the past 240 hours).    Radiology Studies: No results found.  Scheduled Meds:  Continuous Infusions:   LOS: 1 day   Time spent: 25 mins  Edelin Fryer Vicci, MD How to contact the [facility] Attending or Consulting provider 7A - 7P or covering provider during after hours 7P -7A, for this patient?  Check the care team in [facility] and look for a) attending/consulting TRH provider listed and b) the TRH team listed Log into [email or web address] to find provider on call.  Locate the TRH provider you are looking for  under [facility] and page to a number that you can be directly reached. If you still have difficulty reaching the provider, please page the [facility] (Director on Call) for the Hospitalists listed on amion for assistance.  01/12/2024, 12:46 PM

## 2024-01-12 NOTE — Plan of Care (Signed)
  Problem: Education: Goal: Knowledge of General Education information will improve Description: Including pain rating scale, medication(s)/side effects and non-pharmacologic comfort measures Outcome: Not Applicable   Problem: Health Behavior/Discharge Planning: Goal: Ability to manage health-related needs will improve Outcome: Not Applicable   Problem: Clinical Measurements: Goal: Ability to maintain clinical measurements within normal limits will improve Outcome: Not Applicable Goal: Will remain free from infection Outcome: Not Applicable Goal: Diagnostic test results will improve Outcome: Not Applicable Goal: Respiratory complications will improve Outcome: Not Applicable Goal: Cardiovascular complication will be avoided Outcome: Not Applicable   Problem: Activity: Goal: Risk for activity intolerance will decrease Outcome: Not Applicable   Problem: Nutrition: Goal: Adequate nutrition will be maintained Outcome: Not Applicable   Problem: Coping: Goal: Level of anxiety will decrease Outcome: Not Applicable   Problem: Elimination: Goal: Will not experience complications related to bowel motility Outcome: Not Applicable Goal: Will not experience complications related to urinary retention Outcome: Not Applicable   Problem: Pain Managment: Goal: General experience of comfort will improve and/or be controlled Outcome: Not Applicable   Problem: Safety: Goal: Ability to remain free from injury will improve Outcome: Not Applicable   Problem: Skin Integrity: Goal: Risk for impaired skin integrity will decrease Outcome: Not Applicable

## 2024-01-12 NOTE — Hospital Course (Signed)
 83 y.o. female with medical history significant for AVMs with recurrent GI bleeding, chronic HFpEF, CAD status post CABG, PAF not anticoagulated, colon cancer status postresection, type 2 diabetes mellitus, and ESRD on hemodialysis who presents with low blood glucose.   Patient was reportedly in her usual state of health today at home, ate breakfast, and went to dialysis where she reportedly completed her session.  She was noted to be somnolent following the treatment and CBG was in the low 60s.  She was given food and drink but repeat CBG was still in the low 60s and she was sent to the ED for evaluation.     Patient has no complaints herself.  Patient has been evaluated by GI numerous times in the hospital and receives long-acting octreotide  injections and blood transfusions.   ED Course: Upon arrival to the ED, patient is found to be afebrile and saturating well on room air with stable BP.  Labs are most notable for potassium 2.5, albumin  2.9, and hemoglobin 6.5.   Patient was treated with IV potassium and had 2 units RBC ordered for transfusion from the ED.

## 2024-01-12 NOTE — Progress Notes (Signed)
 Pt has had good day. Slept most of day but easily awakened with name called. Pt has been more interactive with staff, answering yes/no questions, smiling and cooperative with oral care and repositioning. Pt has taken water  and soda orally when oral syringe used to place in her mouth. No itching noted today. No urine output. No fever and no pain/discomfort noted. Has required no pain or anxiety medication noted. Family has been at bedside this afternoon. Advised to call for needs, state understanding.

## 2024-01-13 DIAGNOSIS — Z515 Encounter for palliative care: Secondary | ICD-10-CM | POA: Diagnosis not present

## 2024-01-13 NOTE — TOC Progression Note (Signed)
 Transition of Care [facility]) - Progression Note   Patient Details  Name: Shawna [name] MRN: [id number] Date of Birth: [birth year]  Transition of [facility]) CM/SW Contact  Nena LITTIE Coffee, RN Phone Number: 01/13/2024, 3:48 PM  Clinical Narrative:    Per [place] at [facility] inpt, they likely will not have a bed until Monday.   Expected Discharge Plan and Services   Social Determinants of Health (SDOH) Interventions SDOH Screenings   Food Insecurity: No Food Insecurity (01/11/2024)  Housing: Low Risk  (01/11/2024)  Transportation Needs: No Transportation Needs (01/11/2024)  Utilities: Not At Risk (01/11/2024)  Recent Concern: Utilities - At Risk (11/04/2023)  Financial Resource Strain: Low Risk  (12/14/2018)  Physical Activity: Unknown (12/14/2018)  Social Connections: Socially Integrated (01/11/2024)  Stress: No Stress Concern Present (12/14/2018)  Tobacco Use: Low Risk  (01/04/2024)  Health Literacy: Adequate Health Literacy (08/01/2023)   Received from [facility]  Recent Concern: Health Literacy - Inadequate Health Literacy (07/20/2023)   Received from [facility]   Readmission Risk Interventions    01/08/2024   10:38 AM 11/06/2023   10:21 AM 09/10/2023    9:23 AM  Readmission Risk Prevention Plan  Transportation Screening Complete Complete Complete  Medication Review [employer]) Complete Complete Complete  PCP or Specialist appointment within 3-5 days of discharge   Complete  HRI or Home Care Consult Complete Complete Complete  SW Recovery Care/Counseling Consult Complete Complete Complete  Palliative Care Screening -- Not Applicable Not Applicable  Skilled Nursing Facility Not Applicable Not Applicable Not Applicable

## 2024-01-13 NOTE — Progress Notes (Signed)
 This pt continues to sleep. She awakened periodically throughout the night. Pt appeared more alert. Answered yes/or when asked if she was experiencing discomfort. Pt tolerated a few drops of water  by oral syringe. No family was at pts bedside. Will continue to monitor and maintain pts comfort care.

## 2024-01-13 NOTE — Plan of Care (Signed)

## 2024-01-13 NOTE — Progress Notes (Signed)
 PROGRESS NOTE   Shawna [name]  FMW:[id number] DOB: 08-18-1940 DOA: 01/11/2024 PCP: [facility]   No chief complaint on file.  Level of care:   Brief Admission History:  83 y.o. female with medical history significant for AVMs with recurrent GI bleeding, chronic HFpEF, CAD status post CABG, PAF not anticoagulated, colon cancer status postresection, type 2 diabetes mellitus, and ESRD on hemodialysis who presents with low blood glucose.   Patient was reportedly in her usual state of health today at home, ate breakfast, and went to dialysis where she reportedly completed her session.  She was noted to be somnolent following the treatment and CBG was in the low 60s.  She was given food and drink but repeat CBG was still in the low 60s and she was sent to the ED for evaluation.     Patient has no complaints herself.  Patient has been evaluated by GI numerous times in the hospital and receives long-acting octreotide  injections and blood transfusions.   ED Course: Upon arrival to the ED, patient is found to be afebrile and saturating well on room air with stable BP.  Labs are most notable for potassium 2.5, albumin  2.9, and hemoglobin 6.5.   Patient was treated with IV potassium and had 2 units RBC ordered for transfusion from the ED.   Assessment and Plan:  Symptomatic acute on chronic anemia --suspect acute blood loss anemia superimposed on chronic anemia due to ESRD and ongoing chronic GI blood loss -Hemoglobin at time of admission 6.5 - Prior history of AVMs; over a year without endoscopic evaluation.  Receiving octreotide  as an outpatient. - Status post EGD on 01/04/2024 with findings of Normal esophagus.  - Gastritis,  Six non- bleeding angioectasias in the duodenum. Treated with argon plasma coagulation ( APC) . - Non- bleeding jejunal diverticulum.  -- Hgb now greater than 9 after transfusion of 2 units of PRBCs this admission Per Dr. Pearlean notation; As of 01/08/24 patient  remained largely nonverbal and unresponsive (since 01/05/2024)--despite interventions including hemodialysis on 01/05/2024 and hemodialysis on 01/08/2024, lactulose  enemas and lactulose  via NG tube with multiple (>10)  BMs---serum ammonia is only 46--patient remains unresponsive and nonverbal and unable to take oral intake--with recurrent episodes of hypoglycemia treated with initially dextrose  and then Ensure supplements via NG tube --. After Further conversations with patient's husband at bedside and daughter Shawna [name] on speaker phone on 01/08/24--family has decided to transition to full comfort care given overall poor prognosis. - Patient transitioned to full comfort care on 01/08/2024 at family's request  I had another conversation with daughter and grandaughter at bedside 7/2 and reaffirmed that we have run out of medical options and have no reasonable expectation of a meaningful recovery and the recommendation is to move forward with full comfort measures. They verbalized understanding and they are meeting with [facility] hospice.     ESRD - Monday, Wednesday and Friday (regular schedule). - Tolerated dialysis treatment well on 01/03/2024 and again on 01/05/2024 and 01/08/24 - Nephrology consult appreciated - I spoke with renal care team and they informed me that patient no longer candidate for HD and agreeable to comfort measures - Patient transitioned to full comfort care on 01/08/2024 at family's request   DM2-with renal complications  - Episodes of recurrent hypoglycemia due to poor oral intake  Patient transitioned to full comfort care on 01/08/2024 at family's request   social/ethics--- plan of care, advanced directives and goals of care discussed with patient's husband and patient's  daughter Shawna [name]----and [name] -they confirm DNR DNI status  Patient transitioned to full comfort care on 01/08/2024 at family's request   FTT/recurrent hypoglycemic episodes--- due to poor  oral intake in the setting of worsening lethargy  Patient transitioned to full comfort care on 01/08/2024 at family's request   coronary artery disease/hypertension - No anginal symptoms  Patient transitioned to full comfort care on 01/08/2024 at family's request   history of seizure -No recent seizures  Patient transitioned to full comfort care on 01/08/2024 at family's request   hypothyroidism  Patient transitioned to full comfort care on 01/08/2024 at family's request   GERD - Patient allergic to PPI  Patient transitioned to full comfort care on 01/08/2024 at family's request   acute metabolic encephalopathy--hyperammonemia -Mentation did not improve after hemodialysis session - Poor oral intake  Patient transitioned to full comfort care on 01/08/2024 at family's request   Paroxysmal atrial fibrillation - Not a candidate for anticoagulation therapy with ongoing history of GI bleed.  Patient transitioned to full comfort care on 01/08/2024 at family's request   DVT prophylaxis: full comfort care  Code Status: DNR  Family Communication: meeting with daughter, granddaughter Disposition: anticipate transition to GIP care and residential hospice    Consultants:   Procedures:   Antimicrobials:    Subjective: Pt nonverbal resting comfortably  Objective: Vitals:   01/11/24 2200 01/12/24 1100 01/12/24 2204 01/13/24 0457  BP:  (!) 113/37  (!) 121/40  Pulse:  76  81  Resp: 16 18  18   Temp:  97.7 F (36.5 C) 99.8 F (37.7 C) 98.5 F (36.9 C)  TempSrc:  Axillary Oral Axillary  SpO2: (!) 85% 93%  97%   No intake or output data in the 24 hours ending 01/13/24 1133  There were no vitals filed for this visit.  Examination:  General exam: frail, chronically ill appearing female, Appears calm and comfortable  Respiratory system: no increased work of breathing. Cardiovascular system: normal S1 & S2 heard. No pedal edema. Gastrointestinal system: Abdomen is nondistended.  Hypoactive BS Central nervous system: somnolent, appears to have left sided facial droop. Extremities: no IV in place.  Skin: age related changes;. Psychiatry: unable to assess due to current state/condition  Data Reviewed: I have personally reviewed following labs and imaging studies  CBC: Recent Labs  Lab 01/08/24 0924 01/08/24 1030  WBC 8.6 8.1  HGB 9.9* 9.5*  HCT 30.9* 28.2*  MCV 94.5 96.6  PLT 216 202    Basic Metabolic Panel: Recent Labs  Lab 01/07/24 0415 01/08/24 0924 01/08/24 1030  NA 134* 139 138  K 3.5 4.2 3.9  CL 98 100 99  CO2 20* 23 23  GLUCOSE 224* 126* 124*  BUN 47* 74* 73*  CREATININE 5.35* 7.50* 7.28*  CALCIUM  8.5* 8.8* 8.6*  PHOS  --   --  4.2    CBG: Recent Labs  Lab 01/07/24 1113 01/07/24 1720 01/07/24 2112 01/08/24 0714 01/08/24 1628  GLUCAP 104* 85 105* 129* 78    No results found for this or any previous visit (from the past 240 hours).    Radiology Studies: No results found.  Scheduled Meds:  Continuous Infusions:   LOS: 2 days   Time spent: 25 mins  Alizay Bronkema Vicci, MD How to contact the [facility] Attending or Consulting provider 7A - 7P or covering provider during after hours 7P -7A, for this patient?  Check the care team in [facility] and look for a) attending/consulting TRH provider listed  and b) the TRH team listed Log into [email or web address] to find provider on call.  Locate the TRH provider you are looking for under [facility] and page to a number that you can be directly reached. If you still have difficulty reaching the provider, please page the [facility] (Director on Call) for the Hospitalists listed on amion for assistance.  01/13/2024, 11:33 AM

## 2024-01-14 DIAGNOSIS — Z515 Encounter for palliative care: Secondary | ICD-10-CM | POA: Diagnosis not present

## 2024-01-14 NOTE — Progress Notes (Signed)
 PROGRESS NOTE   Shawna [name]  FMW:[id number] DOB: October 05, 1940 DOA: 01/11/2024 PCP: [facility]   No chief complaint on file.  Level of care: GIP (HIP)  Brief Admission History:  83 y.o. female with medical history significant for AVMs with recurrent GI bleeding, chronic HFpEF, CAD status post CABG, PAF not anticoagulated, colon cancer status postresection, type 2 diabetes mellitus, and ESRD on hemodialysis who presents with low blood glucose.   Patient was reportedly in her usual state of health today at home, ate breakfast, and went to dialysis where she reportedly completed her session.  She was noted to be somnolent following the treatment and CBG was in the low 60s.  She was given food and drink but repeat CBG was still in the low 60s and she was sent to the ED for evaluation.     Patient has no complaints herself.  Patient has been evaluated by GI numerous times in the hospital and receives long-acting octreotide  injections and blood transfusions.   ED Course: Upon arrival to the ED, patient is found to be afebrile and saturating well on room air with stable BP.  Labs are most notable for potassium 2.5, albumin  2.9, and hemoglobin 6.5.   Patient was treated with IV potassium and had 2 units RBC ordered for transfusion from the ED.   Assessment and Plan:  Symptomatic acute on chronic anemia --suspect acute blood loss anemia superimposed on chronic anemia due to ESRD and ongoing chronic GI blood loss -Hemoglobin at time of admission 6.5 - Prior history of AVMs; over a year without endoscopic evaluation.  Receiving octreotide  as an outpatient. - Status post EGD on 01/04/2024 with findings of Normal esophagus.  - Gastritis,  Six non- bleeding angioectasias in the duodenum. Treated with argon plasma coagulation ( APC) . - Non- bleeding jejunal diverticulum.  -- Hgb now greater than 9 after transfusion of 2 units of PRBCs this admission Per Dr. Pearlean notation; As of 01/08/24  patient remained largely nonverbal and unresponsive (since 01/05/2024)--despite interventions including hemodialysis on 01/05/2024 and hemodialysis on 01/08/2024, lactulose  enemas and lactulose  via NG tube with multiple (>10)  BMs---serum ammonia is only 46--patient remains unresponsive and nonverbal and unable to take oral intake--with recurrent episodes of hypoglycemia treated with initially dextrose  and then Ensure supplements via NG tube --. After Further conversations with patient's husband at bedside and daughter [name] on speaker phone on 01/08/24--family has decided to transition to full comfort care given overall poor prognosis. - Patient transitioned to full comfort care on 01/08/2024 at family's request  I had another conversation with daughter and grandaughter at bedside 7/2 and reaffirmed that we have run out of medical options and have no reasonable expectation of a meaningful recovery and the recommendation is to move forward with full comfort measures. They verbalized understanding and they are meeting with [facility] hospice.     ESRD - Monday, Wednesday and Friday (regular schedule). - Tolerated dialysis treatment well on 01/03/2024 and again on 01/05/2024 and 01/08/24 - Nephrology consult appreciated - I spoke with renal care team and they informed me that patient no longer candidate for HD and agreeable to comfort measures - Patient transitioned to full comfort care on 01/08/2024 at family's request   DM2-with renal complications  - Episodes of recurrent hypoglycemia due to poor oral intake  Patient transitioned to full comfort care on 01/08/2024 at family's request   social/ethics--- plan of care, advanced directives and goals of care discussed with patient's husband and  patient's daughter [name]----and granddaughter [name] -they confirm DNR DNI status  Patient transitioned to full comfort care on 01/08/2024 at family's request   FTT/recurrent hypoglycemic episodes--- due to  poor oral intake in the setting of worsening lethargy  Patient transitioned to full comfort care on 01/08/2024 at family's request   coronary artery disease/hypertension - No anginal symptoms  Patient transitioned to full comfort care on 01/08/2024 at family's request   history of seizure -No recent seizures  Patient transitioned to full comfort care on 01/08/2024 at family's request   hypothyroidism  Patient transitioned to full comfort care on 01/08/2024 at family's request   GERD - Patient allergic to PPI  Patient transitioned to full comfort care on 01/08/2024 at family's request   acute metabolic encephalopathy--hyperammonemia -Mentation did not improve after hemodialysis session - Poor oral intake  Patient transitioned to full comfort care on 01/08/2024 at family's request   Paroxysmal atrial fibrillation - Not a candidate for anticoagulation therapy with ongoing history of GI bleed.  Patient transitioned to full comfort care on 01/08/2024 at family's request   DVT prophylaxis: full comfort care  Code Status: DNR  Family Communication: meeting with daughter, granddaughter Disposition: anticipate transition to GIP care and residential hospice    Consultants:   Procedures:   Antimicrobials:    Subjective: Pt is awake, but confused, appears comfortable. Does not appear to be uncomfortable.    Objective: Vitals:   01/12/24 2204 01/13/24 0457 01/13/24 1436 01/14/24 0436  BP:  (!) 121/40 (!) 136/48 107/73  Pulse:  81 81 (!) 47  Resp:  18  10  Temp: 99.8 F (37.7 C) 98.5 F (36.9 C)  97.8 F (36.6 C)  TempSrc: Oral Axillary  Oral  SpO2:  97% 91% 98%   No intake or output data in the 24 hours ending 01/14/24 1226  There were no vitals filed for this visit.  Examination:  General exam: frail, chronically ill appearing female, Appears calm and comfortable  Respiratory system: no increased work of breathing. Cardiovascular system: normal S1 & S2 heard. No pedal  edema. Gastrointestinal system: Abdomen is nondistended. Hypoactive BS Central nervous system: somnolent, appears to have left sided facial droop. Extremities: no IV in place.  Skin: age related changes;. Psychiatry: unable to assess due to current state/condition  Data Reviewed: I have personally reviewed following labs and imaging studies  CBC: Recent Labs  Lab 01/08/24 0924 01/08/24 1030  WBC 8.6 8.1  HGB 9.9* 9.5*  HCT 30.9* 28.2*  MCV 94.5 96.6  PLT 216 202    Basic Metabolic Panel: Recent Labs  Lab 01/08/24 0924 01/08/24 1030  NA 139 138  K 4.2 3.9  CL 100 99  CO2 23 23  GLUCOSE 126* 124*  BUN 74* 73*  CREATININE 7.50* 7.28*  CALCIUM  8.8* 8.6*  PHOS  --  4.2    CBG: Recent Labs  Lab 01/07/24 1720 01/07/24 2112 01/08/24 0714 01/08/24 1628  GLUCAP 85 105* 129* 78   No results found for this or any previous visit (from the past 240 hours).  Radiology Studies: No results found.  Scheduled Meds:  Continuous Infusions:   LOS: 3 days   Time spent: 25 mins  Bettyann Birchler Vicci, MD How to contact the [facility] Attending or Consulting provider 7A - 7P or covering provider during after hours 7P -7A, for this patient?  Check the care team in [facility] and look for a) attending/consulting TRH provider listed and b) the TRH team listed Log  into [email or web address] to find provider on call.  Locate the TRH provider you are looking for under [facility] and page to a number that you can be directly reached. If you still have difficulty reaching the provider, please page the [facility] (Director on Call) for the Hospitalists listed on amion for assistance.  01/14/2024, 12:26 PM

## 2024-01-14 NOTE — Plan of Care (Signed)
  Problem: Education: Goal: Knowledge of General Education information will improve Description: Including pain rating scale, medication(s)/side effects and non-pharmacologic comfort measures Outcome: Not Applicable   Problem: Health Behavior/Discharge Planning: Goal: Ability to manage health-related needs will improve Outcome: Not Applicable   Problem: Clinical Measurements: Goal: Ability to maintain clinical measurements within normal limits will improve Outcome: Not Applicable Goal: Will remain free from infection Outcome: Not Applicable Goal: Diagnostic test results will improve Outcome: Not Applicable Goal: Respiratory complications will improve Outcome: Not Applicable Goal: Cardiovascular complication will be avoided Outcome: Not Applicable   Problem: Activity: Goal: Risk for activity intolerance will decrease Outcome: Not Applicable   Problem: Nutrition: Goal: Adequate nutrition will be maintained Outcome: Not Applicable   Problem: Coping: Goal: Level of anxiety will decrease Outcome: Not Applicable   Problem: Elimination: Goal: Will not experience complications related to bowel motility Outcome: Not Applicable Goal: Will not experience complications related to urinary retention Outcome: Not Applicable   Problem: Pain Managment: Goal: General experience of comfort will improve and/or be controlled Outcome: Not Applicable   Problem: Safety: Goal: Ability to remain free from injury will improve Outcome: Not Applicable   Problem: Skin Integrity: Goal: Risk for impaired skin integrity will decrease Outcome: Not Applicable

## 2024-01-15 DIAGNOSIS — Z515 Encounter for palliative care: Secondary | ICD-10-CM | POA: Diagnosis not present

## 2024-01-15 NOTE — Progress Notes (Signed)
 Report called to RN at [facility]

## 2024-01-15 NOTE — Care Management Important Message (Signed)
 Important Message  Patient Details IM Letter not given due to Comfort Care. Name: Shawna [name] MRN: [id number] Date of Birth: Feb 17, 1941   Important Message Given:  No     [name] 01/15/2024, 10:26 AM

## 2024-01-15 NOTE — Discharge Summary (Signed)
 Physician Discharge Summary  Shawna [name] FMW:[id number] DOB: 19-May-1941 DOA: 01/11/2024  PCP: [facility]  Admit date: 01/11/2024 Discharge date: 01/15/2024  Disposition: RESIDENTIAL HOSPICE   Recommendations for Outpatient Follow-up:  SYMPTOM MANAGEMENT  PER HOSPICE PROTOCOL   Discharge Condition: HOSPICE   CODE STATUS: DNR  Brief Hospitalization Summary: Please see all hospital notes, images, labs for full details of the hospitalization.   Symptomatic acute on chronic anemia --suspect acute blood loss anemia superimposed on chronic anemia due to ESRD and ongoing chronic GI blood loss -Hemoglobin at time of admission 6.5 - Prior history of AVMs; over a year without endoscopic evaluation.  Receiving octreotide  as an outpatient. - Status post EGD on 01/04/2024 with findings of Normal esophagus.  - Gastritis,  Six non- bleeding angioectasias in the duodenum. Treated with argon plasma coagulation ( APC) . - Non- bleeding jejunal diverticulum.  -- Hgb now greater than 9 after transfusion of 2 units of PRBCs this admission Per Dr. Pearlean notation; As of 01/08/24 patient remained largely nonverbal and unresponsive (since 01/05/2024)--despite interventions including hemodialysis on 01/05/2024 and hemodialysis on 01/08/2024, lactulose  enemas and lactulose  via NG tube with multiple (>10)  BMs---serum ammonia is only 46--patient remains unresponsive and nonverbal and unable to take oral intake--with recurrent episodes of hypoglycemia treated with initially dextrose  and then Ensure supplements via NG tube --. After Further conversations with patient's husband at bedside and daughter [name] on speaker phone on 01/08/24--family has decided to transition to full comfort care given overall poor prognosis. - Patient transitioned to full comfort care on 01/08/2024 at family's request   I had another conversation with daughter and grandaughter at bedside 01/10/24 and reaffirmed that we have run out of  medical options and have no reasonable expectation of a meaningful recovery and the recommendation is to move forward with full comfort measures. They verbalized understanding and they are meeting with [facility] hospice.     ESRD - Monday, Wednesday and Friday (regular schedule). - Tolerated dialysis treatment well on 01/03/2024 and again on 01/05/2024 and 01/08/24 - Nephrology consult appreciated - I spoke with renal care team and they informed me that patient no longer candidate for HD and agreeable to comfort measures - Patient transitioned to full comfort care on 01/08/2024 at family's request   DM2-with renal complications  - Episodes of recurrent hypoglycemia due to poor oral intake  Patient transitioned to full comfort care on 01/08/2024 at family's request   social/ethics--- plan of care, advanced directives and goals of care discussed with patient's husband and patient's daughter [name]----and granddaughter [name] -they confirm DNR DNI status  Patient transitioned to full comfort care on 01/08/2024 at family's request   FTT/recurrent hypoglycemic episodes--- due to poor oral intake in the setting of worsening lethargy  Patient transitioned to full comfort care on 01/08/2024 at family's request   coronary artery disease/hypertension - No anginal symptoms  Patient transitioned to full comfort care on 01/08/2024 at family's request   history of seizure -No recent seizures  Patient transitioned to full comfort care on 01/08/2024 at family's request   hypothyroidism  Patient transitioned to full comfort care on 01/08/2024 at family's request   GERD - Patient allergic to PPI  Patient transitioned to full comfort care on 01/08/2024 at family's request   acute metabolic encephalopathy--hyperammonemia -Mentation did not improve after hemodialysis session - Poor oral intake  Patient transitioned to full comfort care on 01/08/2024 at family's request   Paroxysmal atrial  fibrillation -  Not a candidate for anticoagulation therapy with ongoing history of GI bleed.  Patient transitioned to full comfort care on 01/08/2024 at family's request    Discharge Diagnoses:  Principal Problem:   Comfort measures only status   Discharge Instructions:  Allergies as of 01/15/2024       Reactions   Amlodipine  Swelling   Aspirin  Other (See Comments)   High Doses Mess up her stomach; makes my bowels have blood in them. Takes 81 mg EC Aspirin     Nitrofurantoin Hives   Ranexa  [ranolazine ] Other (See Comments)   Myoclonus-hospitalized    Bactrim [sulfamethoxazole-trimethoprim] Rash   Gabapentin  Other (See Comments)   Unknown reaction   Iodinated Contrast Media Itching, Other (See Comments)   Iron  Itching, Other (See Comments)   they gave me iron  in dialysis; had to give me Benadryl  cause I had to have the iron  (05/02/2012)   Sucroferric Oxyhydroxide Other (See Comments)   Unknown   Tylenol  [acetaminophen ] Itching, Other (See Comments)   Makes her feet on fire per pt - can tolerate per pt   Levaquin  [levofloxacin  In D5w] Rash   Levofloxacin  Rash   Plavix  [clopidogrel  Bisulfate] Rash   Protonix  [pantoprazole  Sodium] Rash   Venofer [ferric Oxide] Itching, Other (See Comments)   Patient reports using Benadryl  prior to doses as [facility]        Medication List     STOP taking these medications    acetaminophen  325 MG tablet Commonly known as: TYLENOL    albuterol  (2.5 MG/3ML) 0.083% nebulizer solution Commonly known as: PROVENTIL    albuterol  108 (90 Base) MCG/ACT inhaler Commonly known as: VENTOLIN  HFA   benzonatate  100 MG capsule Commonly known as: TESSALON    calcitRIOL  0.25 MCG capsule Commonly known as: ROCALTROL    cinacalcet  30 MG tablet Commonly known as: SENSIPAR    cyanocobalamin  1000 MCG/ML injection Commonly known as: VITAMIN B12   famotidine  20 MG tablet Commonly known as: Pepcid    folic acid  1 MG tablet Commonly known as:  FOLVITE    guaifenesin  100 MG/5ML syrup Commonly known as: ROBITUSSIN   hydrALAZINE  50 MG tablet Commonly known as: APRESOLINE    hydrOXYzine  25 MG tablet Commonly known as: ATARAX    isosorbide  mononitrate 30 MG 24 hr tablet Commonly known as: IMDUR    lactulose  10 GM/15ML solution Commonly known as: CHRONULAC    levETIRAcetam  500 MG tablet Commonly known as: KEPPRA    levothyroxine  50 MCG tablet Commonly known as: SYNTHROID    metoprolol  succinate 25 MG 24 hr tablet Commonly known as: Toprol  XL   multivitamin Tabs tablet   nitroGLYCERIN  0.4 MG SL tablet Commonly known as: NITROSTAT    octreotide  20 MG injection Commonly known as: SANDOSTATIN  LAR   ondansetron  4 MG disintegrating tablet Commonly known as: ZOFRAN -ODT   rosuvastatin  10 MG tablet Commonly known as: CRESTOR         Allergies  Allergen Reactions   Amlodipine  Swelling   Aspirin  Other (See Comments)    High Doses Mess up her stomach; makes my bowels have blood in them. Takes 81 mg EC Aspirin     Nitrofurantoin Hives   Ranexa  [Ranolazine ] Other (See Comments)    Myoclonus-hospitalized    Bactrim [Sulfamethoxazole-Trimethoprim] Rash   Gabapentin  Other (See Comments)    Unknown reaction   Iodinated Contrast Media Itching and Other (See Comments)   Iron  Itching and Other (See Comments)    they gave me iron  in dialysis; had to give me Benadryl  cause I had to have the iron  (05/02/2012)   Sucroferric Oxyhydroxide Other (  See Comments)    Unknown   Tylenol  [Acetaminophen ] Itching and Other (See Comments)    Makes her feet on fire per pt - can tolerate per pt   Levaquin  [Levofloxacin  In D5w] Rash   Levofloxacin  Rash   Plavix  [Clopidogrel  Bisulfate] Rash   Protonix  [Pantoprazole  Sodium] Rash   Venofer [Ferric Oxide] Itching and Other (See Comments)    Patient reports using Benadryl  prior to doses as [facility]   Allergies as of 01/15/2024       Reactions   Amlodipine  Swelling   Aspirin  Other (See  Comments)   High Doses Mess up her stomach; makes my bowels have blood in them. Takes 81 mg EC Aspirin     Nitrofurantoin Hives   Ranexa  [ranolazine ] Other (See Comments)   Myoclonus-hospitalized    Bactrim [sulfamethoxazole-trimethoprim] Rash   Gabapentin  Other (See Comments)   Unknown reaction   Iodinated Contrast Media Itching, Other (See Comments)   Iron  Itching, Other (See Comments)   they gave me iron  in dialysis; had to give me Benadryl  cause I had to have the iron  (05/02/2012)   Sucroferric Oxyhydroxide Other (See Comments)   Unknown   Tylenol  [acetaminophen ] Itching, Other (See Comments)   Makes her feet on fire per pt - can tolerate per pt   Levaquin  [levofloxacin  In D5w] Rash   Levofloxacin  Rash   Plavix  [clopidogrel  Bisulfate] Rash   Protonix  [pantoprazole  Sodium] Rash   Venofer [ferric Oxide] Itching, Other (See Comments)   Patient reports using Benadryl  prior to doses as [facility]        Medication List     STOP taking these medications    acetaminophen  325 MG tablet Commonly known as: TYLENOL    albuterol  (2.5 MG/3ML) 0.083% nebulizer solution Commonly known as: PROVENTIL    albuterol  108 (90 Base) MCG/ACT inhaler Commonly known as: VENTOLIN  HFA   benzonatate  100 MG capsule Commonly known as: TESSALON    calcitRIOL  0.25 MCG capsule Commonly known as: ROCALTROL    cinacalcet  30 MG tablet Commonly known as: SENSIPAR    cyanocobalamin  1000 MCG/ML injection Commonly known as: VITAMIN B12   famotidine  20 MG tablet Commonly known as: Pepcid    folic acid  1 MG tablet Commonly known as: FOLVITE    guaifenesin  100 MG/5ML syrup Commonly known as: ROBITUSSIN   hydrALAZINE  50 MG tablet Commonly known as: APRESOLINE    hydrOXYzine  25 MG tablet Commonly known as: ATARAX    isosorbide  mononitrate 30 MG 24 hr tablet Commonly known as: IMDUR    lactulose  10 GM/15ML solution Commonly known as: CHRONULAC    levETIRAcetam  500 MG tablet Commonly known  as: KEPPRA    levothyroxine  50 MCG tablet Commonly known as: SYNTHROID    metoprolol  succinate 25 MG 24 hr tablet Commonly known as: Toprol  XL   multivitamin Tabs tablet   nitroGLYCERIN  0.4 MG SL tablet Commonly known as: NITROSTAT    octreotide  20 MG injection Commonly known as: SANDOSTATIN  LAR   ondansetron  4 MG disintegrating tablet Commonly known as: ZOFRAN -ODT   rosuvastatin  10 MG tablet Commonly known as: CRESTOR         Procedures/Studies: DG Abd 1 View Result Date: 01/06/2024 CLINICAL DATA:  [id number] Encounter for imaging study to confirm nasogastric (NG) tube placement [id number] EXAM: ABDOMEN - 1 VIEW COMPARISON:  October 06, 2023 FINDINGS: Esophagogastric tube terminates just to the right of midline, possibly in the gastric antrum or duodenal bulb. Nonobstructive bowel gas pattern. Multiple surgical clips in the pelvis. No pneumoperitoneum. No organomegaly or radiopaque calculi. Diffuse osteopenia. Sternotomy wires and partially visualized central  venous catheter. Cardiomegaly. Bilateral interstitial opacities. IMPRESSION: 1. Esophagogastric tube terminates just to the right of midline, possibly in the gastric antrum or duodenal bulb. 2. Cardiomegaly. Bilateral interstitial opacities, may reflect interstitial edema. Partially visualized central venous catheter in the right atrium. Electronically Signed   By: Rogelia Myers M.D.   On: 01/06/2024 18:55     Subjective: Pt appears comfortable in no apparent distress; scratching at times   Discharge Exam: Vitals:   01/14/24 0436 01/14/24 1336  BP: 107/73 (!) 126/43  Pulse: (!) 47 80  Resp: 10 16  Temp: 97.8 F (36.6 C) 97.8 F (36.6 C)  SpO2: 98% 97%   Vitals:   01/13/24 0457 01/13/24 1436 01/14/24 0436 01/14/24 1336  BP: (!) 121/40 (!) 136/48 107/73 (!) 126/43  Pulse: 81 81 (!) 47 80  Resp: 18  10 16   Temp: 98.5 F (36.9 C)  97.8 F (36.6 C) 97.8 F (36.6 C)  TempSrc: Axillary  Oral Axillary  SpO2: 97% 91% 98% 97%    General exam: frail, chronically ill appearing female, Appears calm and comfortable  Respiratory system: no increased work of breathing. Cardiovascular system: normal S1 & S2 heard. No pedal edema. Gastrointestinal system: Abdomen is nondistended. Hypoactive BS Central nervous system: somnolent, appears to have left sided facial droop. Extremities: no IV in place.  Skin: age related changes;. Psychiatry: unable to assess due to current state/condition   The results of significant diagnostics from this hospitalization (including imaging, microbiology, ancillary and laboratory) are listed below for reference.     Microbiology: No results found for this or any previous visit (from the past 240 hours).   Labs: BNP (last 3 results) Recent Labs    05/13/23 1500  BNP 3,351.0*   Basic Metabolic Panel: Recent Labs  Lab 01/08/24 1030  NA 138  K 3.9  CL 99  CO2 23  GLUCOSE 124*  BUN 73*  CREATININE 7.28*  CALCIUM  8.6*  PHOS 4.2   Liver Function Tests: Recent Labs  Lab 01/08/24 1030  ALBUMIN  2.8*   No results for input(s): LIPASE, AMYLASE in the last 168 hours. No results for input(s): AMMONIA in the last 168 hours. CBC: Recent Labs  Lab 01/08/24 1030  WBC 8.1  HGB 9.5*  HCT 28.2*  MCV 96.6  PLT 202   Cardiac Enzymes: No results for input(s): CKTOTAL, CKMB, CKMBINDEX, TROPONINI in the last 168 hours. BNP: Invalid input(s): POCBNP CBG: Recent Labs  Lab 01/08/24 1628  GLUCAP 78   D-Dimer No results for input(s): DDIMER in the last 72 hours. Hgb A1c No results for input(s): HGBA1C in the last 72 hours. Lipid Profile No results for input(s): CHOL, HDL, LDLCALC, TRIG, CHOLHDL, LDLDIRECT in the last 72 hours. Thyroid  function studies No results for input(s): TSH, T4TOTAL, T3FREE, THYROIDAB in the last 72 hours.  Invalid input(s): FREET3 Anemia work up No results for input(s): VITAMINB12, FOLATE, FERRITIN,  TIBC, IRON , RETICCTPCT in the last 72 hours. Urinalysis    Component Value Date/Time   COLORURINE YELLOW 12/16/2012 1919   APPEARANCEUR CLOUDY (A) 12/16/2012 1919   LABSPEC 1.009 12/16/2012 1919   PHURINE 7.5 12/16/2012 1919   GLUCOSEU NEGATIVE 12/16/2012 1919   HGBUR TRACE (A) 12/16/2012 1919   BILIRUBINUR NEGATIVE 12/16/2012 1919   KETONESUR NEGATIVE 12/16/2012 1919   PROTEINUR 100 (A) 12/16/2012 1919   UROBILINOGEN 0.2 12/16/2012 1919   NITRITE NEGATIVE 12/16/2012 1919   LEUKOCYTESUR SMALL (A) 12/16/2012 1919   Sepsis Labs Recent Labs  Lab 01/08/24 1030  WBC 8.1   Microbiology No results found for this or any previous visit (from the past 240 hours).  Time coordinating discharge: 25 mins  SIGNED:  Afton Louder, MD  [facility] 01/15/2024, 10:12 AM How to contact the [facility] Attending or Consulting provider 7A - 7P or covering provider during after hours 7P -7A, for this patient?  Check the care team in [name] and look for a) attending/consulting TR[facility] provider listed and b) the TRH team listed Log into [email or web address] and use [place]'s universal password to access. If you do not have the password, please contact the hospital operator. Locate the TRH provider you are looking for under [facility] and page to a number that you can be directly reached. If you still have difficulty reaching the provider, please page the [facility] (Director on Call) for the Hospitalists listed on amion for assistance.

## 2024-01-15 NOTE — TOC Transition Note (Signed)
 Transition of Care [facility]) - Discharge Note   Patient Details  Name: Shawna [name] MRN: [id number] Date of Birth: 1941-05-26  [facility] CM/SW Contact:  Lucie Lunger, LCSWA Phone Number: 01/15/2024, 11:19 AM  Clinical Narrative:    CSW updated by Dorina with [facility] hosp[facility] who states that a bed is available at [facility] today this afternoon. CSW provided report number to RN. CSW updated by Dorina who states that EMS has been arranged for 2pm, RN provided with this update. TOC signing off.    Final next level of care: Hospice Medical Facility Barriers to Discharge: Barriers Resolved   Patient Goals and CMS Choice Patient states their goals for this hospitalization and ongoing recovery are:: go to [employer].gov Compare Post Acute Care list provided to:: Patient Represenative (must comment) Choice offered to / list presented to : Spouse      Discharge Placement                       Discharge Plan and Services Additional resources added to the After Visit Summary for                                       Social Drivers of Health (SDOH) Interventions SDOH Screenings   Food Insecurity: No Food Insecurity (01/11/2024)  Housing: Low Risk  (01/11/2024)  Transportation Needs: No Transportation Needs (01/11/2024)  Utilities: Not At Risk (01/11/2024)  Recent Concern: Utilities - At Risk (11/04/2023)  Financial Resource Strain: Low Risk  (12/14/2018)  Physical Activity: Unknown (12/14/2018)  Social Connections: Socially Integrated (01/11/2024)  Stress: No Stress Concern Present (12/14/2018)  Tobacco Use: Low Risk  (01/04/2024)  Health Literacy: Adequate Health Literacy (08/01/2023)   Received from [facility]  Recent Concern: Health Literacy - Inadequate Health Literacy (07/20/2023)   [facility]     Readmission Risk Interventions    01/08/2024   10:38 AM 11/06/2023   10:21 AM 09/10/2023    9:23 AM  Readmission Risk  Prevention Plan  Transportation Screening Complete Complete Complete  Medication Review [employer]) Complete Complete Complete  PCP or Specialist appointment within 3-5 days of discharge   Complete  HRI or Home Care Consult Complete Complete Complete  SW Recovery Care/Counseling Consult Complete Complete Complete  Palliative Care Screening -- Not Applicable Not Applicable  Skilled Nursing Facility Not Applicable Not Applicable Not Applicable

## 2024-01-17 ENCOUNTER — Ambulatory Visit: Admitting: Cardiology

## 2024-01-24 NOTE — Discharge Summary (Signed)
 DISCHARGE SUMMARY  Admission: 01/01/2024  Discharge: 01/11/2024   Shawna [name]  FMW:[id number] DOB: [birth year] DOA: 01/01/2024 PCP: [facility]   Chief Complaint  Patient presents with   Hypoglycemia   Level of care: Med-Surg  Brief Admission History:  83 y.o. female with medical history significant for AVMs with recurrent GI bleeding, chronic HFpEF, CAD status post CABG, PAF not anticoagulated, colon cancer status postresection, type 2 diabetes mellitus, and ESRD on hemodialysis who presents with low blood glucose.   Patient was reportedly in her usual state of health today at home, ate breakfast, and went to dialysis where she reportedly completed her session.  She was noted to be somnolent following the treatment and CBG was in the low 60s.  She was given food and drink but repeat CBG was still in the low 60s and she was sent to the ED for evaluation.     Patient has no complaints herself.  Patient has been evaluated by GI numerous times in the hospital and receives long-acting octreotide  injections and blood transfusions.   ED Course: Upon arrival to the ED, patient is found to be afebrile and saturating well on room air with stable BP.  Labs are most notable for potassium 2.5, albumin  2.9, and hemoglobin 6.5.   Patient was treated with IV potassium and had 2 units RBC ordered for transfusion from the ED.  As of 01/08/24 patient remains largely nonverbal and unresponsive (since 01/05/2024)--despite interventions including hemodialysis on 01/05/2024 and hemodialysis on 01/08/2024, lactulose  enemas and lactulose  via NG tube with multiple (>10)  BMs---serum ammonia is only 46--patient remains unresponsive and nonverbal and unable to take oral intake--with recurrent episodes of hypoglycemia treated with initially dextrose  and then Ensure supplements via NG tube -- After Further conversations with patient's husband at bedside and daughter Shawna [name] on speaker phone on  01/08/24--family decided to transition to full comfort care given overall poor prognosis. -- Patient transitioned to full comfort care on 01/08/2024 at family's request   Assessment and Plan:  Symptomatic acute on chronic anemia --suspect acute blood loss anemia superimposed on chronic anemia due to ESRD and ongoing chronic GI blood loss -Hemoglobin at time of admission 6.5 - Prior history of AVMs; over a year without endoscopic evaluation.  Receiving octreotide  as an outpatient. - Status post EGD on 01/04/2024 with findings of Normal esophagus.  - Gastritis,  Six non- bleeding angioectasias in the duodenum. Treated with argon plasma coagulation ( APC) . - Non- bleeding jejunal diverticulum.  -- Hgb now greater than 9 after transfusion of 2 units of PRBCs this admission Per Dr. Pearlean notation; As of 01/08/24 patient remained largely nonverbal and unresponsive (since 01/05/2024)--despite interventions including hemodialysis on 01/05/2024 and hemodialysis on 01/08/2024, lactulose  enemas and lactulose  via NG tube with multiple (>10)  BMs---serum ammonia is only 46--patient remains unresponsive and nonverbal and unable to take oral intake--with recurrent episodes of hypoglycemia treated with initially dextrose  and then Ensure supplements via NG tube --. After Further conversations with patient's husband at bedside and daughter Shawna [name] on speaker phone on 01/08/24--family has decided to transition to full comfort care given overall poor prognosis. - Patient transitioned to full comfort care on 01/08/2024 at family's request  I had another conversation with daughter and grandaughter at bedside 7/2 and reaffirmed that we have run out of medical options and have no reasonable expectation of a meaningful recovery and the recommendation is to move forward with full comfort measures. They verbalized understanding and they are  meeting with [facility] hospice.     ESRD - Monday, Wednesday and Friday (regular  schedule). - Tolerated dialysis treatment well on 01/03/2024 and again on 01/05/2024 and 01/08/24 - Nephrology consult appreciated - I spoke with renal care team and they informed me that patient no longer candidate for HD and agreeable to comfort measures - Patient transitioned to full comfort care on 01/08/2024 at family's request   DM2-with renal complications  - Episodes of recurrent hypoglycemia due to poor oral intake  Patient transitioned to full comfort care on 01/08/2024 at family's request   social/ethics--- plan of care, advanced directives and goals of care discussed with patient's husband and patient's daughter Shawna Shawna [name]----and [name] -they confirm DNR DNI status  Patient transitioned to full comfort care on 01/08/2024 at family's request   FTT/recurrent hypoglycemic episodes--- due to poor oral intake in the setting of worsening lethargy  Patient transitioned to full comfort care on 01/08/2024 at family's request   coronary artery disease/hypertension - No anginal symptoms  Patient transitioned to full comfort care on 01/08/2024 at family's request   history of seizure -No recent seizures  Patient transitioned to full comfort care on 01/08/2024 at family's request   hypothyroidism  Patient transitioned to full comfort care on 01/08/2024 at family's request   GERD - Patient allergic to PPI  Patient transitioned to full comfort care on 01/08/2024 at family's request   acute metabolic encephalopathy--elevated ammonia noted -Mentation did not improve after hemodialysis session - Poor oral intake  Patient transitioned to full comfort care on 01/08/2024 at family's request   Paroxysmal atrial fibrillation - Not a candidate for anticoagulation therapy with ongoing history of GI bleed.  Patient transitioned to full comfort care on 01/08/2024 at family's request   DVT prophylaxis: full comfort care  Code Status: DNR  Family Communication: meeting with  daughter, granddaughter Disposition: anticipate transition to GIP care and residential hospice    Consultants:   Procedures:   Antimicrobials:    Subjective: Pt nonverbal resting comfortably  Objective: Vitals:   01/08/24 1412 01/09/24 0026 01/10/24 0502 01/10/24 2058  BP: (!) 168/70 (!) 113/43 (!) 128/41 (!) 106/48  Pulse: 63 65 67 68  Resp: 16 16  16   Temp:   98.8 F (37.1 C)   TempSrc:   Oral   SpO2: 97% 99% 90% 95%  Weight:      Height:       No intake or output data in the 24 hours ending 01/11/24 1614  Filed Weights   01/08/24 0424 01/08/24 0941 01/08/24 1334  Weight: 45.9 kg 45.8 kg 44.7 kg   Examination:  General exam: frail, chronically ill appearing female, Appears calm and comfortable  Respiratory system: no increased work of breathing. Cardiovascular system: normal S1 & S2 heard. No pedal edema. Gastrointestinal system: Abdomen is nondistended. Hypoactive BS Central nervous system: somnolent, appears to have left sided facial droop. Extremities: no IV in place.  Skin: age related changes;. Psychiatry: unable to assess due to current state/condition  Data Reviewed: I have personally reviewed following labs and imaging studies  CBC: Recent Labs  Lab 01/05/24 0321 01/08/24 0924 01/08/24 1030  WBC 7.1 8.6 8.1  HGB 13.6 9.9* 9.5*  HCT 41.7 30.9* 28.2*  MCV 94.3 94.5 96.6  PLT 179 216 202    Basic Metabolic Panel: Recent Labs  Lab 01/05/24 0321 01/07/24 0415 01/08/24 0924 01/08/24 1030  NA 137 134* 139 138  K 4.2 3.5 4.2 3.9  CL 99  98 100 99  CO2 21* 20* 23 23  GLUCOSE 126* 224* 126* 124*  BUN 33* 47* 74* 73*  CREATININE 4.10* 5.35* 7.50* 7.28*  CALCIUM  9.2 8.5* 8.8* 8.6*  MG 1.9  --   --   --   PHOS 3.4  --   --  4.2    CBG: Recent Labs  Lab 01/07/24 1113 01/07/24 1720 01/07/24 2112 01/08/24 0714 01/08/24 1628  GLUCAP 104* 85 105* 129* 78    Recent Results (from the past 240 hours)  MRSA Next Gen by PCR, Nasal     Status:  None   Collection Time: 01/01/24 11:54 PM   Specimen: Nasal Mucosa; Nasal Swab  Result Value Ref Range Status   MRSA by PCR Next Gen NOT DETECTED NOT DETECTED Final    Comment: (NOTE) The GeneXpert MRSA Assay (FDA approved for NASAL specimens only), is one component of a comprehensive MRSA colonization surveillance program. It is not intended to diagnose MRSA infection nor to guide or monitor treatment for MRSA infections. Test performance is not FDA approved in patients less than 102 years old. Performed at [facility], [name], [place], [place] [address]      Radiology Studies: No results found.  Scheduled Meds:  scopolamine   1 patch Transdermal Q72H   Continuous Infusions:   LOS: 5 days   Time spent: 50 mins  Ashliegh Parekh Vicci, MD How to contact the [facility] Attending or Consulting provider 7A - 7P or covering provider during after hours 7P -7A, for this patient?  Check the care team in [facility] and look for a) attending/consulting [facility] provider listed and b) the [facility] team listed Log into [email or web address] to find provider on call.  Locate the [facility] provider you are looking for under [facility] and page to a number that you can be directly reached. If you still have difficulty reaching the provider, please page the [facility] (Director on Call) for the Hospitalists listed on amion for assistance.  01/11/2024, 4:14 PM

## 2024-01-29 NOTE — Progress Notes (Deleted)
 GI Office Note    Referring Provider: [facility] Primary Care Physician:  [facility] Primary Gastroenterologist: *** Date:  01/29/2024  ID:  Shawna [name], DOB [birth year], MRN [id number]  Chief Complaint   No chief complaint on file.  History of Present Illness  Shawna [name] is a 83 y.o. female with a history of *** presenting today with complaint of   Admission to [facility] in January 2024 for anemia. Underwent EGD and colonoscopy 08/11/2022. 1 small bowel AVM ablated, small duodenal polyp removed (duodenal adenoma), colonoscopy with healthy-appearing anastomosis x 2, 6 polyps removed in the transverse descending sigmoid colons, tubular adenomas on pathology.   Seen by our service during hospitalization 08/27/22 for anemia. Patient with baseline dementia therefore history difficult to obtain. No melena, brbpr, abdominal pain. Hgb 6.6. on admission and received 2u PRBC. Underwent EGD and VCE as noted below. Advised to consider octreotide  subcu outpatient.    Procedure history: 07/2016 EGD for anemia: -prior duodenal AVMs, recent NSTEMI and pending cardiac cath.  2 cm hiatal hernia.  Normal stomach and esophagus.  Solitary duodenal versus jejunal polyp was not removed.  Nonbleeding jejunal AVM treated with APC and may have been source for anemia.   02/2019 colonoscopy.  For evaluation gastrointestinal bleeding: -Pandiverticulosis.  Patent ileocolonic anastomosis.  Removed 2 small polyps (TAs) from ascending colon.  Piecemeal resection using a lift technique of large polyp (TA) in transverse colon.   09/2019 EGD for evaluation duodenal polyp: Esophagus, Z-line normal.  3 cm hiatal hernia.  Normal stomach and duodenal bulb.  Solitary, diminutive, sessile polyp at D2 was biopsied (adenoma wo HGD).  Exam completed to third duodenum.   02/05/2021 EGD for melena, blood loss anemia: -Showed mild severity esophageal candidiasis without bleeding.  Gastritis, nonbleeding.  Normal  examined duodenum to D2.  Treated with Diflucan  for 14 days and continue daily oral PPI.   04/22/2021 VCE: -Successful, complete study.  Small, flat polyp in either second or third portion duodenum with no stigmata of bleeding.  2 polyps at distal small bowel, 1 small size the other polyp medium.  No bleeding stigmata in either of these polyps.  Dr Golda rec push enteroscopy.     06/08/2021 colonoscopy, enteroscopy were cxld due to IV infiltration RUE.  LUE not amenable to IV as her fistula is in that arm..  Plans for outpt PICC line and proceed with GI procedures in January 2022, but procedures never happened. Meds include low-dose aspirin .  Prilosec 20 mg/day, Zofran  prn.  Folic acid . Mircera, last dose 1/22.    08/11/2022 EGD: -2 cm hiatal hernia, normal esophagus otherwise, duodenal AVM status post APC, 5 mm duodenal polyp removed (duodenal adenoma)   08/11/2022 colonoscopy: -end-to-end ileocolonic anastomosis ascending colon, end-to-side colocolonic anastomosis rectosigmoid colon, normal neoterminal ileum, 6 polyps removed in the transverse, descending, sigmoid colons.  Tubular adenomas on pathology   EGD 08/27/2022: -multiple gastric AVMs treated with APC, small hiatal hernia, no active or stigmata of bleeding. VCE placed   VCE 08/27/22 read 08/28/22 -A few small AVMs in the small bowel, likely jejunum.  2 small polyps in the distal small bowel previously noted.  No active or stigmata of bleeding.  Capsule appears to sit at the ileocolonic anastomosis for the last 2 to 3 hours of study.   KUB 08/28/22 showed capsule in RLQ   KUB 08/29/22 with capsule likely within the rectum  (lower midline pelvis)   Last office visit March 2024. ***  Small bowel endoscopy 11/2022 - Esophagogastric landmarks identified. - 2 cm hiatal hernia. - A few non-bleeding angiodysplastic lesions in the stomach. Treated with argon plasma coagulation (APC). - Normal stomach otherwise - Normal examined duodenum. - A  tattoo was seen in the jejunum with a small  polyp. - Jejunal diverticulum - several, one large. - Six non-bleeding angiodysplastic lesions in the proximal jejunum beyond the tattoo. Treated with argon plasma coagulation (APC). AVMs are the likely cause of bleeding / anemia for this patient with ESRD. Ablated. NO active bleeding    Hospitalized in March 2025 an again in April 2025. In march presented with rectal bleeding x1. Having good appetite. Iron  studies normal. Hgb ranged 7.1-8.3. In April (4/25) she presented at recommendation of outpatient provider for Hgb 6.9. In ED Hgb 8.3 and was FOBT positive and she was offered EGD with enteroscopy and she declined. She was transfused and advised to continue monthly octreotide  injections. She was discharged 5/6 with hgb 8 and at one point during hospitalization she was up to 10.4 on 4/27 and 9.3 on 4/30.   Hospitalization 6/23-01/11/24. ***Seen by our team. ***  Underwent repeat enteroscopy 01/04/24: - Normal esophagus. - Gastritis. - Six non- bleeding angioectasias in the duodenum. Treated with APC.  - Non- bleeding jejunal diverticulum.  - No specimens collected    Today:    Wt Readings from Last 5 Encounters:  01/08/24 98 lb 8.7 oz (44.7 kg)  12/26/23 112 lb (50.8 kg)  12/20/23 111 lb 15.9 oz (50.8 kg)  11/30/23 112 lb (50.8 kg)  11/30/23 112 lb (50.8 kg)    No current outpatient medications on file.   No current facility-administered medications for this visit.    Past Medical History:  Diagnosis Date   Acute on chronic respiratory failure with hypoxia (HCC) 10/10/2016   Anxiety    Arthritis    AVM (arteriovenous malformation) of colon    CAD (coronary artery disease)    a. s/p CABG in 2013 b. DES to D1 in 10/2016. c. cath in 07/2018 showing patent grafts with occlusion of D1 at prior stent site and progression of PDA disease --> medical management recommended   Carotid artery disease (HCC)    a. 60-79% LICA, 03/2012     Chronic bronchitis (HCC)    Chronic HFrEF (heart failure with reduced ejection fraction) (HCC)    Colon cancer (HCC) 1992   Esophageal stricture    ESRD on hemodialysis (HCC)    ESRD due to HTN, started dialysis 2011 and gets HD at [facility] with Dr Edwardo on MWF schedule.  Access is LUA AVF as of Sept 2014.    GERD (gastroesophageal reflux disease)    High cholesterol 12/2011   History of blood transfusion 07/2011; 12/2011; 01/2012 X 2; 04/2012   History of gout    History of lower GI bleeding    Hypertension    Iron  deficiency anemia    Jugular vein occlusion, right (HCC)    Mitral regurgitation    a. Moderate by echo, 02/2012   Mitral valve disease    NSVT (nonsustained ventricular tachycardia) (HCC)    Ovarian cancer (HCC) 1992   PAF (paroxysmal atrial fibrillation) (HCC)    Pneumonia ~ 2009   PUD (peptic ulcer disease)    TIA (transient ischemic attack)    Tricuspid valve disease     Past Surgical History:  Procedure Laterality Date   A/V FISTULAGRAM Left 10/04/2022   Procedure: A/V Fistulagram;  Surgeon: Jama Hacker  G, MD;  Location: [facility] INVASIVE CV LAB;  Service: Cardiovascular;  Laterality: Left;   A/V SHUNTOGRAM Left 03/19/2019   Procedure: A/V SHUNTOGRAM;  Surgeon: Jama Cordella MATSU, MD;  Location: [facility] INVASIVE CV LAB;  Service: Cardiovascular;  Laterality: Left;   ABDOMINAL HYSTERECTOMY  1992   APPENDECTOMY  06/1990   AV FISTULA PLACEMENT  07/2009   left upper arm   AV FISTULA PLACEMENT Right 09/06/2016   Procedure: RIGHT FOREARM ARTERIOVENOUS (AV) GRAFT;  Surgeon: Carlin FORBES Haddock, MD;  Location: [facility] OR;  Service: Vascular;  Laterality: Right;   AV FISTULA PLACEMENT N/A 02/24/2017   Procedure: INSERTION OF ARTERIOVENOUS (AV) GORE-TEX GRAFT ARM (BRACHIAL AXILLARY);  Surgeon: Jama Cordella MATSU, MD;  Location: [facility] ORS;  Service: Vascular;  Laterality: N/A;   AVGG REMOVAL Right 09/06/2016   Procedure: REMOVAL OF Right Arm ARTERIOVENOUS GORETEX GRAFT and Vein Patch  angioplasty of brachial artery;  Surgeon: Lonni GORMAN Blade, MD;  Location: [facility] OR;  Service: Vascular;  Laterality: Right;   BIOPSY  09/26/2019   Procedure: BIOPSY;  Surgeon: Golda Claudis PENNER, MD;  [facility];  Service: Endoscopy;;   BIOPSY  11/01/2022   Procedure: BIOPSY;  Surgeon: Eartha Angelia Sieving, MD;  [facility];  Service: Gastroenterology;;   COLON RESECTION  1992   COLON SURGERY     COLONOSCOPY N/A 03/09/2019   Procedure: COLONOSCOPY;  Surgeon: Golda Claudis PENNER, MD;  [facility];  Service: Endoscopy;  Laterality: N/A;   COLONOSCOPY N/A 08/11/2022   Procedure: COLONOSCOPY;  Surgeon: Albertus Gordy HERO, MD;  Location: [facility] ENDOSCOPY;  Service: Gastroenterology;  Laterality: N/A;   COLONOSCOPY WITH PROPOFOL  N/A 06/08/2021   Procedure: COLONOSCOPY WITH PROPOFOL ;  Surgeon: Eartha Angelia Sieving, MD;  [facility];  Service: Gastroenterology;  Laterality: N/A;  9:05 /Patient is on dialysis Mon Wed Fri   CORONARY ANGIOPLASTY WITH STENT PLACEMENT  12/15/11   2   CORONARY ANGIOPLASTY WITH STENT PLACEMENT  y/2013   1; makes total of 3 (05/02/2012)   CORONARY ARTERY BYPASS GRAFT  06/13/2012   Procedure: CORONARY ARTERY BYPASS GRAFTING (CABG);  Surgeon: Dallas KATHEE Jude, MD;  Location: [facility] OR;  Service: Open Heart Surgery;  Laterality: N/A;  cabg x four;  using left internal mammary artery, and left leg greater saphenous vein harvested endoscopically   CORONARY STENT INTERVENTION N/A 10/13/2016   Procedure: Coronary Stent Intervention;  Surgeon: Debby DELENA Sor, MD;  [facility] LAB;  Service: Cardiovascular;  Laterality: N/A;   DIALYSIS/PERMA CATHETER REMOVAL N/A 04/18/2017   Procedure: DIALYSIS/PERMA CATHETER REMOVAL;  Surgeon: Jama Cordella MATSU, MD;  Location: [facility] INVASIVE CV LAB;  Service: Cardiovascular;  Laterality: N/A;   DILATION AND CURETTAGE OF UTERUS     ENTEROSCOPY N/A 06/08/2021   Procedure: PUSH ENTEROSCOPY;  Surgeon:  Eartha Angelia Sieving, MD;  [facility];  Service: Gastroenterology;  Laterality: N/A;   ENTEROSCOPY N/A 11/01/2022   Procedure: ENTEROSCOPY;  Surgeon: Eartha Angelia Sieving, MD;  [facility];  Service: Gastroenterology;  Laterality: N/A;   ENTEROSCOPY N/A 12/01/2022   Procedure: ENTEROSCOPY;  Surgeon: Leigh Elspeth SQUIBB, MD;  Location: [facility] ENDOSCOPY;  Service: Gastroenterology;  Laterality: N/A;   ENTEROSCOPY N/A 01/04/2024   Procedure: ENTEROSCOPY;  Surgeon: Cinderella [name], MD;  [facility];  Service: Endoscopy;  Laterality: N/A;   ESOPHAGOGASTRODUODENOSCOPY  01/20/2012   Procedure: ESOPHAGOGASTRODUODENOSCOPY (EGD);  Surgeon: Gwendlyn ONEIDA Buddy, MD,FACG;  Location: [facility] ENDOSCOPY;  Service: Endoscopy;  Laterality: N/A;   ESOPHAGOGASTRODUODENOSCOPY N/A 03/26/2013   Procedure: ESOPHAGOGASTRODUODENOSCOPY (EGD);  Surgeon: Norleen LOISE Kiang, MD;  Location: [facility] ENDOSCOPY;  Service: Endoscopy;  Laterality: N/A;   ESOPHAGOGASTRODUODENOSCOPY N/A 04/30/2015   Procedure: ESOPHAGOGASTRODUODENOSCOPY (EGD);  Surgeon: Claudis RAYMOND Rivet, MD;  [facility];  Service: Endoscopy;  Laterality: N/A;  1pm - moved to 10/20 @ 1:10   ESOPHAGOGASTRODUODENOSCOPY N/A 07/29/2016   Procedure: ESOPHAGOGASTRODUODENOSCOPY (EGD);  Surgeon: Elspeth Deward Naval, MD;  Location: [facility] ENDOSCOPY;  Service: Gastroenterology;  Laterality: N/A;  enteroscopy   ESOPHAGOGASTRODUODENOSCOPY N/A 09/26/2019   Procedure: ESOPHAGOGASTRODUODENOSCOPY (EGD);  Surgeon: Rivet Claudis RAYMOND, MD;  [facility];  Service: Endoscopy;  Laterality: N/A;  1250   ESOPHAGOGASTRODUODENOSCOPY N/A 08/11/2022   Procedure: ESOPHAGOGASTRODUODENOSCOPY (EGD);  Surgeon: Albertus Gordy HERO, MD;  Location: [facility] ENDOSCOPY;  Service: Gastroenterology;  Laterality: N/A;   ESOPHAGOGASTRODUODENOSCOPY (EGD) WITH PROPOFOL  N/A 02/05/2021   Procedure: ESOPHAGOGASTRODUODENOSCOPY (EGD) WITH PROPOFOL ;  Surgeon: Cindie Carlin POUR, DO;  [facility];  Service: Endoscopy;  Laterality: N/A;   ESOPHAGOGASTRODUODENOSCOPY (EGD) WITH PROPOFOL  N/A 08/27/2022   Procedure: ESOPHAGOGASTRODUODENOSCOPY (EGD) WITH PROPOFOL ;  Surgeon: Cindie Carlin POUR, DO;  [facility];  Service: Endoscopy;  Laterality: N/A;   GIVENS CAPSULE STUDY N/A 03/07/2019   Procedure: GIVENS CAPSULE STUDY;  Surgeon: Rivet Claudis RAYMOND, MD;  [facility];  Service: Endoscopy;  Laterality: N/A;  7:30   GIVENS CAPSULE STUDY N/A 04/22/2021   Procedure: GIVENS CAPSULE STUDY;  Surgeon: Rivet Claudis RAYMOND, MD;  [facility];  Service: Endoscopy;  Laterality: N/A;  7:30   GIVENS CAPSULE STUDY N/A 08/27/2022   Procedure: GIVENS CAPSULE STUDY;  Surgeon: Cindie Carlin POUR, DO;  [facility];  Service: Endoscopy;  Laterality: N/A;   HOT HEMOSTASIS N/A 08/11/2022   Procedure: HOT HEMOSTASIS (ARGON PLASMA COAGULATION/BICAP);  Surgeon: Albertus Gordy HERO, MD;  Location: [facility] ENDOSCOPY;  Service: Gastroenterology;  Laterality: N/A;   HOT HEMOSTASIS N/A 12/01/2022   Procedure: HOT HEMOSTASIS (ARGON PLASMA COAGULATION/BICAP);  Surgeon: Naval Elspeth SQUIBB, MD;  Location: [facility] ENDOSCOPY;  Service: Gastroenterology;  Laterality: N/A;   INSERTION OF DIALYSIS CATHETER N/A 10/05/2020   Procedure: ABORTED TUNNELED DIALYSIS CATHETER PLACEMENT RIGHT INTERNAL JUGULAR VEIN ;  Surgeon: Kallie Manuelita BROCKS, MD;  Location: [facility];  Service: General;  Laterality: N/A;   INTRAOPERATIVE TRANSESOPHAGEAL ECHOCARDIOGRAM  06/13/2012   Procedure: INTRAOPERATIVE TRANSESOPHAGEAL ECHOCARDIOGRAM;  Surgeon: Dallas KATHEE Jude, MD;  Location: [facility] OR;  Service: Open Heart Surgery;  Laterality: N/A;   IR DIALY SHUNT INTRO NEEDLE/INTRACATH INITIAL W/IMG LEFT Left 10/06/2020   IR FLUORO GUIDE CV LINE RIGHT  06/17/2020   IR FLUORO GUIDE CV LINE RIGHT  11/12/2022   IR GENERIC HISTORICAL  07/26/2016   IR FLUORO GUIDE CV LINE RIGHT 07/26/2016 Ozell Specking, MD [facility]   IR GENERIC HISTORICAL   07/26/2016   IR US  GUIDE VASC ACCESS RIGHT 07/26/2016 Ozell Specking, MD [facility]   IR GENERIC HISTORICAL  08/02/2016   IR US  GUIDE VASC ACCESS RIGHT 08/02/2016 Ozell Specking, MD [facility]   IR GENERIC HISTORICAL  08/02/2016   IR FLUORO GUIDE CV LINE RIGHT 08/02/2016 Ozell Specking, MD [facility]   IR RADIOLOGY PERIPHERAL GUIDED IV START  03/28/2017   IR REMOVAL TUN CV CATH W/O FL  08/11/2020   IR REMOVAL TUN CV CATH W/O FL  11/15/2022   IR THROMBECTOMY AV FISTULA W/THROMBOLYSIS INC/SHUNT/IMG LEFT Left 06/17/2020   IR US  GUIDE VASC ACCESS  LEFT  06/17/2020   IR US  GUIDE VASC ACCESS RIGHT  03/28/2017   IR US  GUIDE VASC ACCESS RIGHT  06/17/2020   IR US  GUIDE VASC ACCESS RIGHT  11/12/2022   LEFT HEART CATH AND CORONARY ANGIOGRAPHY N/A 09/20/2016   Procedure: Left Heart Cath and Coronary Angiography;  Surgeon: Victory LELON Sharps, MD;  Location: [facility] INVASIVE CV LAB;  Service: Cardiovascular;  Laterality: N/A;   LEFT HEART CATH AND CORS/GRAFTS ANGIOGRAPHY N/A 10/13/2016   Procedure: Left Heart Cath and Cors/Grafts Angiography;  Surgeon: Debby DELENA Sor, MD;  [facility]: [facility];  Service: Cardiovascular;  Laterality: N/A;   LEFT HEART CATH AND CORS/GRAFTS ANGIOGRAPHY N/A 07/13/2018   Procedure: LEFT HEART CATH AND CORS/GRAFTS ANGIOGRAPHY;  Surgeon: Swazil[place], Peter M, MD;  Location: [facility] INVASIVE CV LAB;  Service: Cardiovascular;  Laterality: N/A;   LEFT HEART CATH AND CORS/GRAFTS ANGIOGRAPHY N/A 07/22/2021   Procedure: LEFT HEART CATH AND CORS/GRAFTS ANGIOGRAPHY;  Surgeon: Court Dorn PARAS, MD;  [facility] CV LAB;  Service: Cardiovascular;  Laterality: N/A;   LEFT HEART CATHETERIZATION WITH CORONARY ANGIOGRAM N/A 12/15/2011   Procedure: LEFT HEART CATHETERIZATION WITH CORONARY ANGIOGRAM;  Surgeon: Lonni JONETTA Cash, MD;  Location: [facility] CATH LAB;  Service: Cardiovascular;  Laterality: N/A;   LEFT HEART CATHETERIZATION WITH CORONARY ANGIOGRAM N/A 01/10/2012   Procedure: LEFT HEART CATHETERIZATION WITH  CORONARY ANGIOGRAM;  Surgeon: Peter M Swaziland, MD;  Location: [facility] CATH LAB;  Service: Cardiovascular;  Laterality: N/A;   LEFT HEART CATHETERIZATION WITH CORONARY ANGIOGRAM N/A 06/08/2012   Procedure: LEFT HEART CATHETERIZATION WITH CORONARY ANGIOGRAM;  Surgeon: Lonni JONETTA Cash, MD;  Location: [facility] CATH LAB;  Service: Cardiovascular;  Laterality: N/A;   LEFT HEART CATHETERIZATION WITH CORONARY/GRAFT ANGIOGRAM N/A 12/10/2013   Procedure: LEFT HEART CATHETERIZATION WITH EL BILE;  Surgeon: Candyce GORMAN Reek, MD;  Location: [facility] CATH LAB;  Service: Cardiovascular;  Laterality: N/A;   OVARY SURGERY     ovarian cancer   POLYPECTOMY  03/09/2019   Procedure: POLYPECTOMY;  Surgeon: Golda Claudis PENNER, MD;  [facility];  Service: Endoscopy;;  cecal    POLYPECTOMY N/A 09/26/2019   Procedure: DUODENAL POLYPECTOMY;  Surgeon: Golda Claudis PENNER, MD;  [facility];  Service: Endoscopy;  Laterality: N/A;   POLYPECTOMY  08/11/2022   Procedure: POLYPECTOMY;  Surgeon: Albertus Gordy HERO, MD;  Location: [facility] ENDOSCOPY;  Service: Gastroenterology;;   REVISION OF ARTERIOVENOUS GORETEX GRAFT N/A 02/24/2017   Procedure: REVISION OF ARTERIOVENOUS GORETEX GRAFT (RESECTION);  Surgeon: Jama Cordella MATSU, MD;  Location: [facility] ORS;  Service: Vascular;  Laterality: N/A;   REVISON OF ARTERIOVENOUS FISTULA Left 06/19/2020   Procedure: REVISION OF LEFT UPPER ARM AV GRAFT WITH INTERPOSITION JUMP GRAFT USING GORE LIMB;  Surgeon: Gretta Lonni PARAS, MD;  Location: [facility] OR;  Service: Vascular;  Laterality: Left;   SHUNTOGRAM N/A 10/15/2013   Procedure: Fistulogram;  Surgeon: Gaile LELON New, MD;  Location: [facility] CATH LAB;  Service: Cardiovascular;  Laterality: N/A;   SUBMUCOSAL TATTOO INJECTION  11/01/2022   Procedure: SUBMUCOSAL TATTOO INJECTION;  Surgeon: Eartha Angelia Sieving, MD;  [facility];  Service: Gastroenterology;;   THROMBECTOMY / ARTERIOVENOUS GRAFT REVISION  2011   left upper  arm   TUBAL LIGATION  1980's   UPPER EXTREMITY ANGIOGRAPHY Bilateral 12/06/2016   Procedure: Upper Extremity Angiography;  Surgeon: Jama Cordella MATSU, MD;  Location: [facility] INVASIVE CV LAB;  Service: Cardiovascular;  Laterality: Bilateral;   UPPER EXTREMITY INTERVENTION Left 06/06/2017  Procedure: UPPER EXTREMITY INTERVENTION;  Surgeon: Jama Cordella MATSU, MD;  Location: [facility] INVASIVE CV LAB;  Service: Cardiovascular;  Laterality: Left;   UPPER EXTREMITY VENOGRAPHY Left 10/04/2022   Procedure: UPPER EXTREMITY VENOGRAPHY;  Surgeon: Jama Cordella MATSU, MD;  Location: [facility] INVASIVE CV LAB;  Service: Cardiovascular;  Laterality: Left;    Family History  Problem Relation Age of Onset   Heart disease Mother        Heart Disease before age 32   Hyperlipidemia Mother    Hypertension Mother    Diabetes Mother    Heart attack Mother    Heart disease Father        Heart Disease before age 61   Hyperlipidemia Father    Hypertension Father    Diabetes Father    Diabetes Sister    Hypertension Sister    Diabetes Brother    Hyperlipidemia Brother    Heart attack Brother    Hypertension Sister    Heart attack Brother    Colon cancer Child 61   Other Other        noncontributory for early CAD   Esophageal cancer Neg Hx    Liver disease Neg Hx    Kidney disease Neg Hx    Colon polyps Neg Hx     Allergies as of 01/31/2024 - Reviewed 01/11/2024  Allergen Reaction Noted   Amlodipine  Swelling 01/19/2015   Aspirin  Other (See Comments) 12/15/2011   Nitrofurantoin Hives 07/08/2009   Ranexa  [ranolazine ] Other (See Comments) 08/30/2020   Bactrim [sulfamethoxazole-trimethoprim] Rash 12/15/2011   Gabapentin  Other (See Comments) 02/08/2017   Iodinated contrast media Itching and Other (See Comments) 12/15/2011   Iron  Itching and Other (See Comments) 05/02/2012   Sucroferric oxyhydroxide Other (See Comments) 04/15/2021   Tylenol  [acetaminophen ] Itching and Other (See Comments) 07/08/2016   Levaquin   [levofloxacin  in d5w] Rash 10/04/2013   Levofloxacin  Rash 04/15/2017   Plavix  [clopidogrel  bisulfate] Rash 01/05/2012   Protonix  [pantoprazole  sodium] Rash 03/21/2013   Venofer [ferric oxide] Itching and Other (See Comments) 05/02/2012    Social History   Socioeconomic History   Marital status: Married    Spouse name: Shawnice Tilmon   Number of children: 1   Years of education: Not on file   Highest education level: GED or equivalent  Occupational History   Occupation: retired- Architectural technologist- sewed  Tobacco Use   Smoking status: Never   Smokeless tobacco: Never  Vaping Use   Vaping status: Never Used  Substance and Sexual Activity   Alcohol  use: No    Alcohol /week: 0.0 standard drinks of alcohol    Drug use: No   Sexual activity: Not Currently    Birth control/protection: Surgical  Other Topics Concern   Not on file  Social History Narrative   Lives in [place], [place] with husband.  Dialysis pt - [facility].   Social Drivers of Corporate investment banker Strain: Low Risk  (12/14/2018)   Overall Financial Resource Strain (CARDIA)    Difficulty of Paying Living Expenses: Not very hard  Food Insecurity: No Food Insecurity (01/11/2024)   Hunger Vital Sign    Worried About Running Out of Food in the Last Year: Never true    Ran Out of Food in the Last Year: Never true  Transportation Needs: No Transportation Needs (01/11/2024)   PRAPARE - Administrator, Civil Service (Medical): No    Lack of Transportation (Non-Medical): No  Physical Activity: Unknown (12/14/2018)   Exercise Vital Sign  Days of Exercise per Week: Patient declined    Minutes of Exercise per Session: Patient declined  Stress: No Stress Concern Present (12/14/2018)   [employer] of Occupational Health - Occupational Stress Questionnaire    Feeling of Stress : Only a little  Social Connections: Socially Integrated (01/11/2024)   Social Connection and Isolation Panel    Frequency of Communication  with Friends and Family: More than three times a week    Frequency of Social Gatherings with Friends and Family: More than three times a week    Attends Religious Services: More than 4 times per year    Active Member of [employer] or Organizations: Not on file    Attends Engineer, structural: More than 4 times per year    Marital Status: Married     Review of Systems   Gen: Denies fever, chills, anorexia. Denies fatigue, weakness, weight loss.  CV: Denies chest pain, palpitations, syncope, peripheral edema, and claudication. Resp: Denies dyspnea at rest, cough, wheezing, coughing up blood, and pleurisy. GI: See HPI Derm: Denies rash, itching, dry skin Psych: Denies depression, anxiety, memory loss, confusion. No homicidal or suicidal ideation.  Heme: Denies bruising, bleeding, and enlarged lymph nodes.  Physical Exam   There were no vitals taken for this visit.  General:   Alert and oriented. No distress noted. Pleasant and cooperative.  Head:  Normocephalic and atraumatic. Eyes:  Conjuctiva clear without scleral icterus. Mouth:  Oral mucosa pink and moist. Good dentition. No lesions. Lungs:  Clear to auscultation bilaterally. No wheezes, rales, or rhonchi. No distress.  Heart:  S1, S2 present without murmurs appreciated.  Abdomen:  +BS, soft, non-tender and non-distended. No rebound or guarding. No HSM or masses noted. Rectal: *** Msk:  Symmetrical without gross deformities. Normal posture. Extremities:  Without edema. Neurologic:  Alert and  oriented x4 Psych:  Alert and cooperative. Normal mood and affect.  Assessment  Shawna [name] is a 83 y.o. female presenting today with ***  Small bowel angiectasia, IDA:    PLAN   *** CBC *** Monthly octreotide  injections Hematology referral *** Follow up ***    Charmaine Melia, MSN, FNP-BC, AGACNP-BC [facility]

## 2024-01-31 ENCOUNTER — Inpatient Hospital Stay: Admitting: Gastroenterology

## 2024-02-09 DEATH — deceased

## 2024-02-20 ENCOUNTER — Other Ambulatory Visit (HOSPITAL_COMMUNITY): Payer: Self-pay
